# Patient Record
Sex: Female | Born: 1945
Health system: Southern US, Community
[De-identification: ages and names within clinical notes are randomized; demographics above are authoritative.]

## PROBLEM LIST (undated history)

## (undated) DIAGNOSIS — R079 Chest pain, unspecified: Secondary | ICD-10-CM

## (undated) DIAGNOSIS — R0602 Shortness of breath: Secondary | ICD-10-CM

## (undated) DIAGNOSIS — F419 Anxiety disorder, unspecified: Secondary | ICD-10-CM

## (undated) DIAGNOSIS — E039 Hypothyroidism, unspecified: Secondary | ICD-10-CM

## (undated) DIAGNOSIS — E0500 Thyrotoxicosis with diffuse goiter without thyrotoxic crisis or storm: Secondary | ICD-10-CM

## (undated) DIAGNOSIS — Z952 Presence of prosthetic heart valve: Secondary | ICD-10-CM

## (undated) DIAGNOSIS — Z8719 Personal history of other diseases of the digestive system: Secondary | ICD-10-CM

## (undated) DIAGNOSIS — M8580 Other specified disorders of bone density and structure, unspecified site: Secondary | ICD-10-CM

## (undated) DIAGNOSIS — G8929 Other chronic pain: Secondary | ICD-10-CM

## (undated) DIAGNOSIS — I6529 Occlusion and stenosis of unspecified carotid artery: Secondary | ICD-10-CM

## (undated) DIAGNOSIS — I350 Nonrheumatic aortic (valve) stenosis: Secondary | ICD-10-CM

## (undated) DIAGNOSIS — E785 Hyperlipidemia, unspecified: Secondary | ICD-10-CM

## (undated) DIAGNOSIS — I10 Essential (primary) hypertension: Secondary | ICD-10-CM

## (undated) DIAGNOSIS — K219 Gastro-esophageal reflux disease without esophagitis: Secondary | ICD-10-CM

## (undated) DIAGNOSIS — K5792 Diverticulitis of intestine, part unspecified, without perforation or abscess without bleeding: Secondary | ICD-10-CM

## (undated) DIAGNOSIS — Z5189 Encounter for other specified aftercare: Secondary | ICD-10-CM

## (undated) DIAGNOSIS — K635 Polyp of colon: Secondary | ICD-10-CM

## (undated) DIAGNOSIS — R918 Other nonspecific abnormal finding of lung field: Secondary | ICD-10-CM

## (undated) DIAGNOSIS — K2289 Other specified disease of esophagus: Secondary | ICD-10-CM

## (undated) DIAGNOSIS — K228 Other specified diseases of esophagus: Secondary | ICD-10-CM

## (undated) DIAGNOSIS — R011 Cardiac murmur, unspecified: Secondary | ICD-10-CM

## (undated) DIAGNOSIS — K5790 Diverticulosis of intestine, part unspecified, without perforation or abscess without bleeding: Secondary | ICD-10-CM

## (undated) DIAGNOSIS — E119 Type 2 diabetes mellitus without complications: Secondary | ICD-10-CM

## (undated) DIAGNOSIS — M549 Dorsalgia, unspecified: Secondary | ICD-10-CM

## (undated) DIAGNOSIS — M545 Low back pain, unspecified: Secondary | ICD-10-CM

## (undated) DIAGNOSIS — K7460 Unspecified cirrhosis of liver: Secondary | ICD-10-CM

## (undated) DIAGNOSIS — I509 Heart failure, unspecified: Secondary | ICD-10-CM

## (undated) DIAGNOSIS — M1990 Unspecified osteoarthritis, unspecified site: Secondary | ICD-10-CM

## (undated) DIAGNOSIS — I2510 Atherosclerotic heart disease of native coronary artery without angina pectoris: Secondary | ICD-10-CM

## (undated) DIAGNOSIS — K589 Irritable bowel syndrome without diarrhea: Secondary | ICD-10-CM

## (undated) DIAGNOSIS — E079 Disorder of thyroid, unspecified: Secondary | ICD-10-CM

## (undated) DIAGNOSIS — K760 Fatty (change of) liver, not elsewhere classified: Secondary | ICD-10-CM

## (undated) DIAGNOSIS — I639 Cerebral infarction, unspecified: Secondary | ICD-10-CM

## (undated) DIAGNOSIS — M109 Gout, unspecified: Secondary | ICD-10-CM

## (undated) DIAGNOSIS — Z86010 Personal history of colonic polyps: Secondary | ICD-10-CM

## (undated) HISTORY — PX: POLYPECTOMY: SHX149

## (undated) HISTORY — DX: Chest pain, unspecified: R07.9

## (undated) HISTORY — PX: NISSEN FUNDOPLICATION: SHX2091

## (undated) HISTORY — DX: Dorsalgia, unspecified: M54.9

## (undated) HISTORY — DX: Gastro-esophageal reflux disease without esophagitis: K21.9

## (undated) HISTORY — DX: Gout, unspecified: M10.9

## (undated) HISTORY — DX: Shortness of breath: R06.02

## (undated) HISTORY — PX: DILATION AND CURETTAGE OF UTERUS: SHX78

## (undated) HISTORY — DX: Irritable bowel syndrome, unspecified: K58.9

## (undated) HISTORY — DX: Personal history of colonic polyps: Z86.010

## (undated) HISTORY — PX: TONSILLECTOMY: SUR1361

## (undated) HISTORY — DX: Thyrotoxicosis with diffuse goiter without thyrotoxic crisis or storm: E05.00

## (undated) HISTORY — PX: COLONOSCOPY W/ BIOPSIES AND POLYPECTOMY: SHX1376

## (undated) HISTORY — DX: Occlusion and stenosis of unspecified carotid artery: I65.29

## (undated) HISTORY — DX: Diverticulosis of intestine, part unspecified, without perforation or abscess without bleeding: K57.90

## (undated) HISTORY — PX: HYSTEROSCOPY: SHX211

## (undated) HISTORY — DX: Polyp of colon: K63.5

## (undated) HISTORY — DX: Cerebral infarction, unspecified: I63.9

## (undated) HISTORY — PX: GASTRIC FUNDOPLICATION: SHX226

## (undated) HISTORY — DX: Heart failure, unspecified: I50.9

## (undated) HISTORY — PX: LAPAROSCOPIC CHOLECYSTECTOMY: SUR755

## (undated) HISTORY — DX: Hyperlipidemia, unspecified: E78.5

## (undated) HISTORY — DX: Other specified disorders of bone density and structure, unspecified site: M85.80

## (undated) HISTORY — DX: Fatty (change of) liver, not elsewhere classified: K76.0

## (undated) HISTORY — DX: Unspecified osteoarthritis, unspecified site: M19.90

## (undated) HISTORY — PX: HERNIA REPAIR: SHX51

## (undated) HISTORY — DX: Disorder of thyroid, unspecified: E07.9

## (undated) HISTORY — PX: COLONOSCOPY: SHX174

## (undated) HISTORY — DX: Diverticulitis of intestine, part unspecified, without perforation or abscess without bleeding: K57.92

## (undated) HISTORY — DX: Encounter for other specified aftercare: Z51.89

## (undated) HISTORY — DX: Essential (primary) hypertension: I10

## (undated) HISTORY — DX: Cardiac murmur, unspecified: R01.1

## (undated) HISTORY — DX: Anxiety disorder, unspecified: F41.9

## (undated) HISTORY — DX: Unspecified cirrhosis of liver: K74.60

## (undated) HISTORY — PX: LAPAROSCOPY: SHX197

---

## 1983-04-24 HISTORY — PX: LAPAROSCOPIC CHOLECYSTECTOMY: SUR755

## 1992-03-29 ENCOUNTER — Encounter: Payer: Self-pay | Admitting: Internal Medicine

## 1998-01-20 ENCOUNTER — Other Ambulatory Visit: Admission: RE | Admit: 1998-01-20 | Discharge: 1998-01-20 | Payer: Self-pay | Admitting: Obstetrics and Gynecology

## 1998-01-27 ENCOUNTER — Ambulatory Visit (HOSPITAL_COMMUNITY): Admission: RE | Admit: 1998-01-27 | Discharge: 1998-01-27 | Payer: Self-pay | Admitting: Obstetrics and Gynecology

## 1998-05-04 ENCOUNTER — Ambulatory Visit (HOSPITAL_COMMUNITY): Admission: RE | Admit: 1998-05-04 | Discharge: 1998-05-04 | Payer: Self-pay | Admitting: Obstetrics and Gynecology

## 1998-05-04 ENCOUNTER — Encounter: Payer: Self-pay | Admitting: Obstetrics and Gynecology

## 1999-01-23 ENCOUNTER — Encounter: Payer: Self-pay | Admitting: Obstetrics and Gynecology

## 1999-01-23 ENCOUNTER — Ambulatory Visit (HOSPITAL_COMMUNITY): Admission: RE | Admit: 1999-01-23 | Discharge: 1999-01-23 | Payer: Self-pay | Admitting: Obstetrics and Gynecology

## 1999-01-31 ENCOUNTER — Other Ambulatory Visit: Admission: RE | Admit: 1999-01-31 | Discharge: 1999-01-31 | Payer: Self-pay | Admitting: Obstetrics and Gynecology

## 1999-04-12 ENCOUNTER — Encounter: Payer: Self-pay | Admitting: General Surgery

## 1999-04-12 ENCOUNTER — Encounter: Admission: RE | Admit: 1999-04-12 | Discharge: 1999-04-12 | Payer: Self-pay | Admitting: General Surgery

## 1999-09-04 ENCOUNTER — Ambulatory Visit (HOSPITAL_COMMUNITY): Admission: RE | Admit: 1999-09-04 | Discharge: 1999-09-04 | Payer: Self-pay | Admitting: *Deleted

## 2000-01-25 ENCOUNTER — Encounter: Payer: Self-pay | Admitting: Obstetrics and Gynecology

## 2000-01-25 ENCOUNTER — Ambulatory Visit (HOSPITAL_COMMUNITY): Admission: RE | Admit: 2000-01-25 | Discharge: 2000-01-25 | Payer: Self-pay | Admitting: Obstetrics and Gynecology

## 2000-02-29 ENCOUNTER — Other Ambulatory Visit: Admission: RE | Admit: 2000-02-29 | Discharge: 2000-02-29 | Payer: Self-pay | Admitting: Obstetrics and Gynecology

## 2001-02-25 ENCOUNTER — Ambulatory Visit (HOSPITAL_COMMUNITY): Admission: RE | Admit: 2001-02-25 | Discharge: 2001-02-25 | Payer: Self-pay | Admitting: Obstetrics and Gynecology

## 2001-02-25 ENCOUNTER — Encounter: Payer: Self-pay | Admitting: Obstetrics and Gynecology

## 2001-03-05 ENCOUNTER — Other Ambulatory Visit: Admission: RE | Admit: 2001-03-05 | Discharge: 2001-03-05 | Payer: Self-pay | Admitting: Obstetrics and Gynecology

## 2001-03-18 ENCOUNTER — Encounter: Payer: Self-pay | Admitting: Gastroenterology

## 2001-03-24 ENCOUNTER — Encounter: Payer: Self-pay | Admitting: Emergency Medicine

## 2001-03-24 ENCOUNTER — Emergency Department (HOSPITAL_COMMUNITY): Admission: EM | Admit: 2001-03-24 | Discharge: 2001-03-24 | Payer: Self-pay | Admitting: Emergency Medicine

## 2001-03-24 ENCOUNTER — Encounter: Payer: Self-pay | Admitting: Gastroenterology

## 2001-03-25 ENCOUNTER — Encounter: Admission: RE | Admit: 2001-03-25 | Discharge: 2001-03-25 | Payer: Self-pay | Admitting: Gastroenterology

## 2001-03-25 ENCOUNTER — Encounter: Payer: Self-pay | Admitting: Gastroenterology

## 2001-04-08 ENCOUNTER — Encounter: Payer: Self-pay | Admitting: Gastroenterology

## 2001-11-21 ENCOUNTER — Encounter: Payer: Self-pay | Admitting: Family Medicine

## 2001-11-21 ENCOUNTER — Encounter: Admission: RE | Admit: 2001-11-21 | Discharge: 2001-11-21 | Payer: Self-pay | Admitting: Family Medicine

## 2002-02-26 ENCOUNTER — Encounter: Payer: Self-pay | Admitting: Obstetrics and Gynecology

## 2002-02-26 ENCOUNTER — Ambulatory Visit (HOSPITAL_COMMUNITY): Admission: RE | Admit: 2002-02-26 | Discharge: 2002-02-26 | Payer: Self-pay | Admitting: Obstetrics and Gynecology

## 2002-08-11 ENCOUNTER — Encounter: Admission: RE | Admit: 2002-08-11 | Discharge: 2002-11-09 | Payer: Self-pay

## 2003-09-01 ENCOUNTER — Encounter: Payer: Self-pay | Admitting: Family Medicine

## 2003-09-01 LAB — HM COLONOSCOPY: HM Colonoscopy: NORMAL

## 2004-07-18 ENCOUNTER — Emergency Department (HOSPITAL_COMMUNITY): Admission: EM | Admit: 2004-07-18 | Discharge: 2004-07-18 | Payer: Self-pay | Admitting: Emergency Medicine

## 2004-12-07 ENCOUNTER — Encounter: Payer: Self-pay | Admitting: Family Medicine

## 2005-11-21 ENCOUNTER — Encounter: Payer: Self-pay | Admitting: Family Medicine

## 2007-10-13 ENCOUNTER — Encounter: Payer: Self-pay | Admitting: Family Medicine

## 2008-03-08 ENCOUNTER — Encounter: Payer: Self-pay | Admitting: Family Medicine

## 2008-04-23 LAB — CONVERTED CEMR LAB

## 2008-09-08 ENCOUNTER — Encounter: Payer: Self-pay | Admitting: Family Medicine

## 2008-09-08 ENCOUNTER — Encounter (INDEPENDENT_AMBULATORY_CARE_PROVIDER_SITE_OTHER): Payer: Self-pay | Admitting: *Deleted

## 2008-09-08 LAB — CONVERTED CEMR LAB
ALT: 60 units/L
AST: 56 units/L
Albumin: 4.5 g/dL
Alkaline Phosphatase: 53 units/L
B-12, serum: 1038 pg/mL
BUN: 14 mg/dL
CO2, serum: 23 mmol/L
Calcium: 9.9 mg/dL
Chloride, Serum: 105 mmol/L
Cholesterol: 153 mg/dL
Creatinine, Ser: 0.7 mg/dL
Ferritin: 122 ng/mL
Folate: 24 ng/mL
Globulin: 2.6 g/dL
Glucose, Bld: 135 mg/dL
HCT: 37.2 %
HDL: 39 mg/dL
Hemoglobin: 11.5 g/dL
Hgb A1c MFr Bld: 6.3 %
LDL Cholesterol: 96 mg/dL
MCH: 20.4 pg
MCV: 65.9 fL
Platelets: 314 10*3/uL
Potassium, serum: 4.3 mmol/L
RBC count: 5.64 10*6/uL
Sodium, serum: 140 mmol/L
TSH: 1.83 microintl units/mL
Total Bilirubin: 0.7 mg/dL
Total Protein: 7.1 g/dL
Triglycerides: 89 mg/dL
Vit D, 25-Hydroxy: 24 ng/mL
WBC, blood: 6.2 10*3/uL

## 2008-11-23 ENCOUNTER — Encounter (INDEPENDENT_AMBULATORY_CARE_PROVIDER_SITE_OTHER): Payer: Self-pay | Admitting: *Deleted

## 2008-12-13 ENCOUNTER — Ambulatory Visit: Payer: Self-pay | Admitting: Family Medicine

## 2008-12-13 DIAGNOSIS — R002 Palpitations: Secondary | ICD-10-CM | POA: Insufficient documentation

## 2008-12-13 DIAGNOSIS — R220 Localized swelling, mass and lump, head: Secondary | ICD-10-CM | POA: Insufficient documentation

## 2008-12-13 DIAGNOSIS — M899 Disorder of bone, unspecified: Secondary | ICD-10-CM | POA: Insufficient documentation

## 2008-12-13 DIAGNOSIS — M949 Disorder of cartilage, unspecified: Secondary | ICD-10-CM

## 2008-12-13 DIAGNOSIS — N3289 Other specified disorders of bladder: Secondary | ICD-10-CM | POA: Insufficient documentation

## 2008-12-13 DIAGNOSIS — E785 Hyperlipidemia, unspecified: Secondary | ICD-10-CM | POA: Insufficient documentation

## 2008-12-13 DIAGNOSIS — R221 Localized swelling, mass and lump, neck: Secondary | ICD-10-CM

## 2008-12-13 DIAGNOSIS — F411 Generalized anxiety disorder: Secondary | ICD-10-CM | POA: Insufficient documentation

## 2008-12-13 DIAGNOSIS — E039 Hypothyroidism, unspecified: Secondary | ICD-10-CM | POA: Insufficient documentation

## 2008-12-13 LAB — CONVERTED CEMR LAB
Bilirubin Urine: NEGATIVE
Blood in Urine, dipstick: NEGATIVE
Glucose, Urine, Semiquant: NEGATIVE
Ketones, urine, test strip: NEGATIVE
Nitrite: NEGATIVE
Protein, U semiquant: NEGATIVE
Specific Gravity, Urine: <1.005
Urobilinogen, UA: 0.2
WBC Urine, dipstick: NEGATIVE
pH: 7

## 2008-12-16 ENCOUNTER — Encounter (INDEPENDENT_AMBULATORY_CARE_PROVIDER_SITE_OTHER): Payer: Self-pay | Admitting: *Deleted

## 2008-12-16 LAB — CONVERTED CEMR LAB: Hgb A1c MFr Bld: 6.3 % (ref 4.6–6.5)

## 2008-12-17 ENCOUNTER — Encounter: Admission: RE | Admit: 2008-12-17 | Discharge: 2008-12-17 | Payer: Self-pay | Admitting: Family Medicine

## 2008-12-20 ENCOUNTER — Telehealth (INDEPENDENT_AMBULATORY_CARE_PROVIDER_SITE_OTHER): Payer: Self-pay | Admitting: *Deleted

## 2008-12-24 ENCOUNTER — Ambulatory Visit (HOSPITAL_COMMUNITY): Admission: RE | Admit: 2008-12-24 | Discharge: 2008-12-24 | Payer: Self-pay | Admitting: Family Medicine

## 2008-12-24 ENCOUNTER — Encounter: Payer: Self-pay | Admitting: Family Medicine

## 2009-01-03 ENCOUNTER — Telehealth (INDEPENDENT_AMBULATORY_CARE_PROVIDER_SITE_OTHER): Payer: Self-pay | Admitting: *Deleted

## 2009-01-04 ENCOUNTER — Encounter (INDEPENDENT_AMBULATORY_CARE_PROVIDER_SITE_OTHER): Payer: Self-pay | Admitting: *Deleted

## 2009-01-05 ENCOUNTER — Encounter: Payer: Self-pay | Admitting: Family Medicine

## 2009-03-07 ENCOUNTER — Ambulatory Visit: Payer: Self-pay | Admitting: Family

## 2009-03-07 DIAGNOSIS — J209 Acute bronchitis, unspecified: Secondary | ICD-10-CM | POA: Insufficient documentation

## 2009-03-07 DIAGNOSIS — J018 Other acute sinusitis: Secondary | ICD-10-CM | POA: Insufficient documentation

## 2009-03-10 ENCOUNTER — Ambulatory Visit: Payer: Self-pay | Admitting: Internal Medicine

## 2009-03-10 DIAGNOSIS — E559 Vitamin D deficiency, unspecified: Secondary | ICD-10-CM | POA: Insufficient documentation

## 2009-03-10 LAB — CONVERTED CEMR LAB
ALT: 35 units/L (ref 0–35)
AST: 35 units/L (ref 0–37)
BUN: 16 mg/dL (ref 6–23)
Basophils Absolute: 0 10*3/uL (ref 0.0–0.1)
Basophils Relative: 0.2 % (ref 0.0–3.0)
Cholesterol: 151 mg/dL (ref 0–200)
Creatinine, Ser: 0.7 mg/dL (ref 0.4–1.2)
Creatinine,U: 69.1 mg/dL
Eosinophils Absolute: 0.2 10*3/uL (ref 0.0–0.7)
Eosinophils Relative: 3.7 % (ref 0.0–5.0)
HCT: 38 % (ref 36.0–46.0)
HDL: 37.1 mg/dL — ABNORMAL LOW (ref >39.00)
Hemoglobin: 12.1 g/dL (ref 12.0–15.0)
Hgb A1c MFr Bld: 6.2 % (ref 4.6–6.5)
LDL Cholesterol: 94 mg/dL (ref 0–99)
Lymphocytes Relative: 17.7 % (ref 12.0–46.0)
Lymphs Abs: 1.1 10*3/uL (ref 0.7–4.0)
MCHC: 31.7 g/dL (ref 30.0–36.0)
MCV: 66.5 fL — ABNORMAL LOW (ref 78.0–100.0)
Microalb Creat Ratio: 2.9 mg/g (ref 0.0–30.0)
Microalb, Ur: 0.2 mg/dL (ref 0.0–1.9)
Monocytes Absolute: 0.4 10*3/uL (ref 0.1–1.0)
Monocytes Relative: 6.5 % (ref 3.0–12.0)
Neutro Abs: 4.6 10*3/uL (ref 1.4–7.7)
Neutrophils Relative %: 71.9 % (ref 43.0–77.0)
Platelets: 307 10*3/uL (ref 150.0–400.0)
Potassium: 4.6 meq/L (ref 3.5–5.1)
RBC: 5.72 M/uL — ABNORMAL HIGH (ref 3.87–5.11)
RDW: 14.2 % (ref 11.5–14.6)
TSH: 2.68 microintl units/mL (ref 0.35–5.50)
Total CHOL/HDL Ratio: 4
Triglycerides: 99 mg/dL (ref 0.0–149.0)
VLDL: 19.8 mg/dL (ref 0.0–40.0)
WBC: 6.3 10*3/uL (ref 4.5–10.5)

## 2009-03-11 ENCOUNTER — Encounter (INDEPENDENT_AMBULATORY_CARE_PROVIDER_SITE_OTHER): Payer: Self-pay | Admitting: *Deleted

## 2009-03-11 ENCOUNTER — Telehealth (INDEPENDENT_AMBULATORY_CARE_PROVIDER_SITE_OTHER): Payer: Self-pay | Admitting: *Deleted

## 2009-03-12 LAB — CONVERTED CEMR LAB: Vit D, 25-Hydroxy: 17 ng/mL — ABNORMAL LOW (ref 30–89)

## 2009-03-15 ENCOUNTER — Encounter (INDEPENDENT_AMBULATORY_CARE_PROVIDER_SITE_OTHER): Payer: Self-pay | Admitting: *Deleted

## 2009-03-21 ENCOUNTER — Ambulatory Visit: Payer: Self-pay | Admitting: Internal Medicine

## 2009-03-21 ENCOUNTER — Telehealth: Payer: Self-pay | Admitting: Internal Medicine

## 2009-03-22 ENCOUNTER — Ambulatory Visit: Payer: Self-pay | Admitting: Internal Medicine

## 2009-03-22 LAB — CONVERTED CEMR LAB: Calcium: 9.8 mg/dL (ref 8.4–10.5)

## 2009-03-25 ENCOUNTER — Encounter (INDEPENDENT_AMBULATORY_CARE_PROVIDER_SITE_OTHER): Payer: Self-pay | Admitting: *Deleted

## 2009-04-11 ENCOUNTER — Telehealth (INDEPENDENT_AMBULATORY_CARE_PROVIDER_SITE_OTHER): Payer: Self-pay | Admitting: *Deleted

## 2009-04-11 DIAGNOSIS — I517 Cardiomegaly: Secondary | ICD-10-CM | POA: Insufficient documentation

## 2009-04-13 ENCOUNTER — Encounter (INDEPENDENT_AMBULATORY_CARE_PROVIDER_SITE_OTHER): Payer: Self-pay | Admitting: *Deleted

## 2009-04-28 ENCOUNTER — Ambulatory Visit: Payer: Self-pay

## 2009-04-28 ENCOUNTER — Ambulatory Visit: Payer: Self-pay | Admitting: Internal Medicine

## 2009-04-28 ENCOUNTER — Encounter: Payer: Self-pay | Admitting: Internal Medicine

## 2009-04-28 ENCOUNTER — Ambulatory Visit (HOSPITAL_COMMUNITY): Admission: RE | Admit: 2009-04-28 | Discharge: 2009-04-28 | Payer: Self-pay | Admitting: Internal Medicine

## 2009-04-29 ENCOUNTER — Ambulatory Visit: Payer: Self-pay | Admitting: Internal Medicine

## 2009-04-29 DIAGNOSIS — K589 Irritable bowel syndrome without diarrhea: Secondary | ICD-10-CM | POA: Insufficient documentation

## 2009-04-29 DIAGNOSIS — J019 Acute sinusitis, unspecified: Secondary | ICD-10-CM | POA: Insufficient documentation

## 2009-05-02 ENCOUNTER — Telehealth (INDEPENDENT_AMBULATORY_CARE_PROVIDER_SITE_OTHER): Payer: Self-pay | Admitting: *Deleted

## 2009-05-06 ENCOUNTER — Ambulatory Visit: Payer: Self-pay | Admitting: Internal Medicine

## 2009-05-06 ENCOUNTER — Encounter (INDEPENDENT_AMBULATORY_CARE_PROVIDER_SITE_OTHER): Payer: Self-pay | Admitting: *Deleted

## 2009-05-06 LAB — CONVERTED CEMR LAB: Rapid Strep: NEGATIVE

## 2009-05-07 LAB — CONVERTED CEMR LAB
BUN: 14 mg/dL (ref 6–23)
Creatinine, Ser: 0.61 mg/dL (ref 0.40–1.20)
Potassium: 4.3 meq/L (ref 3.5–5.3)
TSH: 1.83 microintl units/mL (ref 0.35–5.50)

## 2009-05-09 ENCOUNTER — Encounter (INDEPENDENT_AMBULATORY_CARE_PROVIDER_SITE_OTHER): Payer: Self-pay | Admitting: *Deleted

## 2009-05-10 ENCOUNTER — Encounter: Payer: Self-pay | Admitting: Internal Medicine

## 2009-05-10 ENCOUNTER — Encounter (INDEPENDENT_AMBULATORY_CARE_PROVIDER_SITE_OTHER): Payer: Self-pay | Admitting: *Deleted

## 2009-05-10 LAB — CONVERTED CEMR LAB
Basophils Absolute: 0.1 10*3/uL (ref 0.0–0.1)
Basophils Relative: 1.1 % (ref 0.0–3.0)
Eosinophils Absolute: 0.2 10*3/uL (ref 0.0–0.7)
Eosinophils Relative: 2.5 % (ref 0.0–5.0)
HCT: 38.6 % (ref 36.0–46.0)
Hemoglobin: 12.1 g/dL (ref 12.0–15.0)
Lymphocytes Relative: 23.1 % (ref 12.0–46.0)
Lymphs Abs: 1.5 10*3/uL (ref 0.7–4.0)
MCHC: 31.2 g/dL (ref 30.0–36.0)
MCV: 65.7 fL — ABNORMAL LOW (ref 78.0–100.0)
Monocytes Absolute: 0.4 10*3/uL (ref 0.1–1.0)
Monocytes Relative: 6 % (ref 3.0–12.0)
Neutro Abs: 4.3 10*3/uL (ref 1.4–7.7)
Neutrophils Relative %: 67.3 % (ref 43.0–77.0)
Platelets: 324 10*3/uL (ref 150.0–400.0)
RBC: 5.87 M/uL — ABNORMAL HIGH (ref 3.87–5.11)
RDW: 13.9 % (ref 11.5–14.6)
WBC: 6.5 10*3/uL (ref 4.5–10.5)

## 2009-05-11 ENCOUNTER — Ambulatory Visit: Payer: Self-pay | Admitting: Cardiovascular Disease

## 2009-05-19 ENCOUNTER — Telehealth (INDEPENDENT_AMBULATORY_CARE_PROVIDER_SITE_OTHER): Payer: Self-pay | Admitting: *Deleted

## 2009-05-20 ENCOUNTER — Encounter (INDEPENDENT_AMBULATORY_CARE_PROVIDER_SITE_OTHER): Payer: Self-pay | Admitting: *Deleted

## 2009-06-01 ENCOUNTER — Ambulatory Visit: Payer: Self-pay | Admitting: Gastroenterology

## 2009-07-18 ENCOUNTER — Ambulatory Visit: Payer: Self-pay | Admitting: Internal Medicine

## 2009-07-19 LAB — CONVERTED CEMR LAB: Hgb A1c MFr Bld: 7.6 % — ABNORMAL HIGH (ref 4.6–6.5)

## 2009-07-20 LAB — CONVERTED CEMR LAB: Vit D, 25-Hydroxy: 30 ng/mL (ref 30–89)

## 2009-07-21 ENCOUNTER — Telehealth: Payer: Self-pay | Admitting: Internal Medicine

## 2009-07-27 ENCOUNTER — Telehealth (INDEPENDENT_AMBULATORY_CARE_PROVIDER_SITE_OTHER): Payer: Self-pay | Admitting: *Deleted

## 2009-08-05 ENCOUNTER — Ambulatory Visit: Payer: Self-pay | Admitting: Endocrinology

## 2009-08-10 ENCOUNTER — Encounter: Payer: Self-pay | Admitting: Family Medicine

## 2009-09-12 ENCOUNTER — Ambulatory Visit: Payer: Self-pay | Admitting: Internal Medicine

## 2009-09-12 ENCOUNTER — Ambulatory Visit: Payer: Self-pay | Admitting: Family Medicine

## 2009-09-12 DIAGNOSIS — M79609 Pain in unspecified limb: Secondary | ICD-10-CM | POA: Insufficient documentation

## 2009-09-12 DIAGNOSIS — M25569 Pain in unspecified knee: Secondary | ICD-10-CM | POA: Insufficient documentation

## 2009-09-13 LAB — CONVERTED CEMR LAB
Cholesterol: 184 mg/dL (ref 0–200)
HDL: 33.2 mg/dL — ABNORMAL LOW (ref >39.00)
Hgb A1c MFr Bld: 7.8 % — ABNORMAL HIGH (ref 4.6–6.5)
LDL Cholesterol: 124 mg/dL — ABNORMAL HIGH (ref 0–99)
Total CHOL/HDL Ratio: 6
Triglycerides: 136 mg/dL (ref 0.0–149.0)
VLDL: 27.2 mg/dL (ref 0.0–40.0)

## 2009-09-14 ENCOUNTER — Telehealth (INDEPENDENT_AMBULATORY_CARE_PROVIDER_SITE_OTHER): Payer: Self-pay | Admitting: *Deleted

## 2009-09-21 ENCOUNTER — Encounter: Payer: Self-pay | Admitting: Family Medicine

## 2009-10-18 ENCOUNTER — Telehealth (INDEPENDENT_AMBULATORY_CARE_PROVIDER_SITE_OTHER): Payer: Self-pay | Admitting: *Deleted

## 2009-10-19 ENCOUNTER — Telehealth (INDEPENDENT_AMBULATORY_CARE_PROVIDER_SITE_OTHER): Payer: Self-pay | Admitting: *Deleted

## 2009-10-26 ENCOUNTER — Telehealth: Payer: Self-pay | Admitting: Endocrinology

## 2009-11-03 ENCOUNTER — Ambulatory Visit: Payer: Self-pay | Admitting: Endocrinology

## 2009-11-16 ENCOUNTER — Encounter: Payer: Self-pay | Admitting: Family Medicine

## 2009-11-21 ENCOUNTER — Telehealth (INDEPENDENT_AMBULATORY_CARE_PROVIDER_SITE_OTHER): Payer: Self-pay | Admitting: *Deleted

## 2009-12-05 ENCOUNTER — Telehealth: Payer: Self-pay | Admitting: Family Medicine

## 2009-12-21 ENCOUNTER — Ambulatory Visit: Payer: Self-pay | Admitting: Family Medicine

## 2009-12-21 DIAGNOSIS — E669 Obesity, unspecified: Secondary | ICD-10-CM | POA: Insufficient documentation

## 2009-12-29 ENCOUNTER — Telehealth (INDEPENDENT_AMBULATORY_CARE_PROVIDER_SITE_OTHER): Payer: Self-pay | Admitting: *Deleted

## 2009-12-29 ENCOUNTER — Ambulatory Visit: Payer: Self-pay | Admitting: Endocrinology

## 2009-12-29 DIAGNOSIS — K7689 Other specified diseases of liver: Secondary | ICD-10-CM | POA: Insufficient documentation

## 2009-12-29 LAB — CONVERTED CEMR LAB
ALT: 49 units/L — ABNORMAL HIGH (ref 0–35)
ALT: 43 units/L — ABNORMAL HIGH (ref 0–35)
AST: 55 units/L — ABNORMAL HIGH (ref 0–37)
AST: 41 units/L — ABNORMAL HIGH (ref 0–37)
Albumin: 4.1 g/dL (ref 3.5–5.2)
Albumin: 4.4 g/dL (ref 3.5–5.2)
Alkaline Phosphatase: 41 units/L (ref 39–117)
Alkaline Phosphatase: 42 units/L (ref 39–117)
BUN: 18 mg/dL (ref 6–23)
Bilirubin, Direct: 0.2 mg/dL (ref 0.0–0.3)
Bilirubin, Direct: 0.2 mg/dL (ref 0.0–0.3)
CO2: 24 meq/L (ref 19–32)
Calcium: 9.6 mg/dL (ref 8.4–10.5)
Chloride: 104 meq/L (ref 96–112)
Creatinine, Ser: 0.7 mg/dL (ref 0.4–1.2)
GFR calc non Af Amer: 86.11 mL/min (ref >60)
Glucose, Bld: 211 mg/dL — ABNORMAL HIGH (ref 70–99)
Hgb A1c MFr Bld: 6.7 % — ABNORMAL HIGH (ref 4.6–6.5)
Potassium: 4.4 meq/L (ref 3.5–5.1)
Sodium: 138 meq/L (ref 135–145)
Total Bilirubin: 0.7 mg/dL (ref 0.3–1.2)
Total Bilirubin: 0.8 mg/dL (ref 0.3–1.2)
Total Protein: 6.9 g/dL (ref 6.0–8.3)
Total Protein: 7.3 g/dL (ref 6.0–8.3)

## 2009-12-30 ENCOUNTER — Telehealth (INDEPENDENT_AMBULATORY_CARE_PROVIDER_SITE_OTHER): Payer: Self-pay | Admitting: *Deleted

## 2010-01-02 ENCOUNTER — Ambulatory Visit (HOSPITAL_COMMUNITY): Admission: RE | Admit: 2010-01-02 | Discharge: 2010-01-02 | Payer: Self-pay | Admitting: Family Medicine

## 2010-01-02 LAB — HM MAMMOGRAPHY: HM Mammogram: NORMAL

## 2010-01-30 ENCOUNTER — Telehealth (INDEPENDENT_AMBULATORY_CARE_PROVIDER_SITE_OTHER): Payer: Self-pay | Admitting: *Deleted

## 2010-01-31 ENCOUNTER — Ambulatory Visit: Payer: Self-pay | Admitting: Family Medicine

## 2010-02-07 ENCOUNTER — Encounter: Payer: Self-pay | Admitting: Family Medicine

## 2010-03-24 ENCOUNTER — Ambulatory Visit: Payer: Self-pay | Admitting: Family Medicine

## 2010-03-24 ENCOUNTER — Encounter: Payer: Self-pay | Admitting: Family Medicine

## 2010-03-24 DIAGNOSIS — R1084 Generalized abdominal pain: Secondary | ICD-10-CM | POA: Insufficient documentation

## 2010-03-24 DIAGNOSIS — R109 Unspecified abdominal pain: Secondary | ICD-10-CM

## 2010-03-26 LAB — CONVERTED CEMR LAB
ALT: 49 units/L — ABNORMAL HIGH (ref 0–35)
AST: 41 units/L — ABNORMAL HIGH (ref 0–37)
Albumin: 4.3 g/dL (ref 3.5–5.2)
Alkaline Phosphatase: 55 units/L (ref 39–117)
BUN: 22 mg/dL (ref 6–23)
Basophils Absolute: 0 10*3/uL (ref 0.0–0.1)
Basophils Relative: 0.5 % (ref 0.0–3.0)
Bilirubin, Direct: 0.2 mg/dL (ref 0.0–0.3)
CO2: 27 meq/L (ref 19–32)
Calcium: 9.7 mg/dL (ref 8.4–10.5)
Chloride: 104 meq/L (ref 96–112)
Cholesterol: 169 mg/dL (ref 0–200)
Creatinine, Ser: 0.6 mg/dL (ref 0.4–1.2)
Eosinophils Absolute: 0.2 10*3/uL (ref 0.0–0.7)
Eosinophils Relative: 2.9 % (ref 0.0–5.0)
GFR calc non Af Amer: 102.25 mL/min (ref >60)
Glucose, Bld: 154 mg/dL — ABNORMAL HIGH (ref 70–99)
HCT: 36 % (ref 36.0–46.0)
HDL: 37.7 mg/dL — ABNORMAL LOW (ref >39.00)
Hemoglobin: 11.9 g/dL — ABNORMAL LOW (ref 12.0–15.0)
Iron: 90 ug/dL (ref 42–145)
LDL Cholesterol: 110 mg/dL — ABNORMAL HIGH (ref 0–99)
Lymphocytes Relative: 18.3 % (ref 12.0–46.0)
Lymphs Abs: 1.3 10*3/uL (ref 0.7–4.0)
MCHC: 33.1 g/dL (ref 30.0–36.0)
MCV: 63.6 fL — ABNORMAL LOW (ref 78.0–100.0)
Monocytes Absolute: 0.5 10*3/uL (ref 0.1–1.0)
Monocytes Relative: 6.9 % (ref 3.0–12.0)
Neutro Abs: 4.9 10*3/uL (ref 1.4–7.7)
Neutrophils Relative %: 71.4 % (ref 43.0–77.0)
Platelets: 254 10*3/uL (ref 150.0–400.0)
Potassium: 4.1 meq/L (ref 3.5–5.1)
RBC: 5.65 M/uL — ABNORMAL HIGH (ref 3.87–5.11)
RDW: 15.1 % — ABNORMAL HIGH (ref 11.5–14.6)
Saturation Ratios: 20.3 % (ref 20.0–50.0)
Sodium: 141 meq/L (ref 135–145)
TSH: 2.35 microintl units/mL (ref 0.35–5.50)
Total Bilirubin: 0.9 mg/dL (ref 0.3–1.2)
Total CHOL/HDL Ratio: 4
Total Protein: 7 g/dL (ref 6.0–8.3)
Transferrin: 316.5 mg/dL (ref 212.0–360.0)
Triglycerides: 107 mg/dL (ref 0.0–149.0)
VLDL: 21.4 mg/dL (ref 0.0–40.0)
WBC: 6.9 10*3/uL (ref 4.5–10.5)

## 2010-03-27 ENCOUNTER — Telehealth (INDEPENDENT_AMBULATORY_CARE_PROVIDER_SITE_OTHER): Payer: Self-pay | Admitting: *Deleted

## 2010-03-27 LAB — CONVERTED CEMR LAB: Vit D, 25-Hydroxy: 24 ng/mL — ABNORMAL LOW (ref 30–89)

## 2010-04-14 ENCOUNTER — Ambulatory Visit: Payer: Self-pay | Admitting: Family Medicine

## 2010-04-14 ENCOUNTER — Telehealth (INDEPENDENT_AMBULATORY_CARE_PROVIDER_SITE_OTHER): Payer: Self-pay | Admitting: *Deleted

## 2010-05-13 ENCOUNTER — Encounter: Payer: Self-pay | Admitting: Sports Medicine

## 2010-05-14 ENCOUNTER — Encounter: Payer: Self-pay | Admitting: Podiatry

## 2010-05-16 ENCOUNTER — Telehealth (INDEPENDENT_AMBULATORY_CARE_PROVIDER_SITE_OTHER): Payer: Self-pay | Admitting: *Deleted

## 2010-05-25 NOTE — Letter (Signed)
Summary: Results Follow up Letter  [facility]   [place], [place] [address]   Phone: [phone]  Fax: [phone]    03/25/2009 MRN: [id number]  Breanna [address] [place], [place]  [address]  Dear [name],  The following are the results of your recent test(s):  Test         Result    Pap Smear:        Normal _____  Not Normal _____ Comments: ______________________________________________________ Cholesterol: LDL(Bad cholesterol):         Your goal is less than:         HDL (Good cholesterol):       Your goal is more than: Comments:  ______________________________________________________ Mammogram:        Normal _____  Not Normal _____ Comments:  ___________________________________________________________________ Hemoccult:        Normal _____  Not normal _______ Comments:    _____________________________________________________________________ Other Tests: Please see attached Chest Xray done on 03/22/2009    We routinely do not discuss normal results over the telephone.  If you desire a copy of the results, or you have any questions about this information we can discuss them at your next office visit.   Sincerely,

## 2010-05-25 NOTE — Letter (Signed)
Summary: [facility] Screening/[facility]   Imported By: Edmonia James 12/30/2009 09:16:03  _____________________________________________________________________  External Attachment:    Type:   Image     Comment:   External Document

## 2010-05-25 NOTE — Assessment & Plan Note (Signed)
Summary: CPX/FASTING//KN   Vital Signs:  Patient profile:   65 year old female Height:      65.25 inches Weight:      188 pounds BMI:     31.16 Pulse rate:   74 / minute BP sitting:   126 / 80  (left arm)  Vitals Entered By: Malachi Bonds CMA (March 24, 2010 8:37 AM) CC: YEARLY EXAM AND LABS    History of Present Illness: 65 yo woman here today for CPE.  UTD on pap, mammogram.  UTD on colonoscopy.  has had flu shot.  Here for Medicare AWV:  1.   Risk factors based on Past M, S, F history: DM- following w/ Dr Loanne Drilling. Hypothyroid- due for labs.  'i have a lot of energy- maybe too much.  we need to check the dose'.  does note brittle nails. Hyperlipidemia- intolerant to statins, on fenofibrate.  no N/V, myalgias. Pelvic pain- saw urology and w/u was negative, saw gyn w/out results, 'it feels like there is a speculum going right through me'.  pain also in back.  worse after busy day.  difficulty getting up from the floor.  L sided.  'it feels like i'm straining'. 2.   Physical Activities: plans on joining a gym, very active in daily activities 3.   Depression/mood: denies current sxs, on Lexapro. 4.   Hearing: normal to whispered voice at 6 ft 5.   ADL's: independent 6.   Fall Risk: not at risk 7.   Home Safety: feels very safe at home, lives w/ husband 64.   Height, weight, &visual acuity: see vitals, vision corrected to 20/20 w/ glasses 9.   Counseling: provided on healthy diet, regular exercise 10.   Labs ordered based on risk factors: see A&P 11.           Referral Coordination: see A&P 12.           Care Plan: see A&P 13.           Cognitive Assessment: normal linear thought process, memory intact, oriented x3  Preventive Screening-Counseling & Management  Alcohol-Tobacco     Alcohol drinks/day: <1     Smoking Status: never  Caffeine-Diet-Exercise     Does Patient Exercise: no      Sexual History:  currently monogamous.        Drug Use:  never.    Current  Medications (verified): 1)  Atenolol 25 Mg Tabs (Atenolol) .Marland Kitchen.. 1 By Mouth Once Daily 2)  Synthroid 150 Mcg Tabs (Levothyroxine Sodium) .... 0.52m Once Daily 3)  Nortriptyline Hcl 25 Mg Caps (Nortriptyline Hcl) ..Marland Kitchen. 1 By Mouth Once Daily 4)  Lexapro 20 Mg Tabs (Escitalopram Oxalate) ..Marland Kitchen. 1 1/2 By Mouth Once Daily 5)  Benicar 20 Mg Tabs (Olmesartan Medoxomil) ..Marland Kitchen. 1 By Mouth Once Daily 6)  Bayer Low Strength 81 Mg Tbec (Aspirin) ..Marland Kitchen. 1 By Mouth Once Daily 7)  Multivitamins  Tabs (Multiple Vitamin) ..Marland Kitchen. 1 By Mouth Once Daily 8)  Folic Acid 8798Mcg Tabs (Folic Acid) ..Marland Kitchen. 1 By Mouth Once Daily 9)  Vitamin C 500 Mg Tabs (Ascorbic Acid) ..Marland Kitchen. 1 By Mouth Once Daily 10)  Vitamin B-12 1000 Mcg Tabs (Cyanocobalamin) ..Marland Kitchen. 1 By Mouth Once Daily 11)  Vitamin D3 5000 Units Tabs (Cholecalciferol) ..Marland Kitchen. 1 Once Daily 12)  Fish Oil  Oil (Fish Oil) .... Take One By Mouth Once Daily 13)  Fenofibrate Micronized 200 Mg Caps (Fenofibrate Micronized) .... Take One Tablet Daily 14)  Janumet  50-1000 Mg Tabs (Sitagliptin-Metformin Hcl) .Marland Kitchen.. 1 Tab By Mouth Two Times A Day 15)  Accu-Chek Comfort Curve  Strp (Glucose Blood) .... Use As Directed  Allergies (verified): No Known Drug Allergies  Past History:  Past medical, surgical, family and social histories (including risk factors) reviewed, and no changes noted (except as noted below).  Past Medical History: DM GERD Graves Disease Osteopenia Hyperlipidemia  Past Surgical History: Reviewed history from 06/01/2009 and no changes required. hysteroscopy/laparoscopy for fibroids Cholecystectomy Hiatal hernia surgery  Family History: Reviewed history from 08/05/2009 and no changes required. ADOPTED, but pt says a sister had dm CANCER, REFLUX,DM, HEART IN BIRTH FAM (? WHO)  Social History: Reviewed history from 06/01/2009 and no changes required. 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  Alcohol Use - no Daily Caffeine Use one per day Illicit Drug Use - no Does Patient Exercise:  no Drug Use:  never  Review of Systems  The patient denies anorexia, fever, weight loss, weight gain, vision loss, decreased hearing, hoarseness, chest pain, syncope, dyspnea on exertion, peripheral edema, prolonged cough, headaches, abdominal pain, melena, hematochezia, severe indigestion/heartburn, hematuria, suspicious skin lesions, depression, abnormal bleeding, enlarged lymph nodes, and breast masses.    Physical Exam  General:  well-nourished,in no acute distress; alert,appropriate and cooperative throughout examination Head:  Normocephalic and atraumatic without obvious abnormalities. No apparent alopecia or balding. Eyes:  No corneal or conjunctival inflammation noted. EOMI. Perrla. Funduscopic exam benign, without hemorrhages, exudates or papilledema. Vision grossly normal. Ears:  External ear exam shows no significant lesions or deformities.  Otoscopic examination reveals clear canals, tympanic membranes are intact bilaterally without bulging, retraction, inflammation or discharge. Hearing is grossly normal bilaterally. Nose:  External nasal examination shows no deformity or inflammation. Nasal mucosa are pink and moist without lesions or exudates. Mouth:  Oral mucosa and oropharynx without lesions or exudates.  Teeth in good repair. Neck:  No deformities, masses, or tenderness noted. Breasts:  gyn Lungs:  Normal respiratory effort, chest expands symmetrically. Lungs are clear to auscultation, no crackles or wheezes Heart:  normal rate, regular rhythm, no gallop, no rub, no JVD, no HJR, and grade  2/6 systolic murmur  Abdomen:  soft, NT/ND, +BS.  no hernia, organomegaly Genitalia:  gyn Pulses:  +2 radial, carotid, DP/PT Extremities:  no C/C/E Neurologic:  No cranial nerve deficits noted. Station and gait are normal. Plantar  reflexes are down-going bilaterally. DTRs are symmetrical throughout. Sensory, motor and coordinative functions appear intact. Skin:  Intact without suspicious lesions or rashes.  Cervical Nodes:  No lymphadenopathy noted Axillary Nodes:  No palpable lymphadenopathy Psych:  memory intact for recent and remote, normally interactive, and good eye contact.     Impression & Recommendations:  Problem # 1:  PHYSICAL EXAMINATION (ICD-V70.0) Assessment New  pt's PE WNL.  check labs.  UTD on health maintainence.  anticipatory guidance provided.  Orders: Medicare -1st Annual Wellness Visit [id number])  Problem # 2:  DIABETES MELLITUS, UNCONTROLLED (ICD-250.02) Assessment: Unchanged following w/ Dr Loanne Drilling now. Her updated medication list for this problem includes:    Benicar 20 Mg Tabs (Olmesartan medoxomil) .Marland Kitchen... 1 by mouth once daily    Bayer Low Strength 81 Mg Tbec (Aspirin) .Marland Kitchen... 1 by mouth once daily    Janumet 50-1000 Mg Tabs (Sitagliptin-metformin hcl) .Marland Kitchen... 1 tab by mouth two times a day  Problem # 3:  HYPOTHYROIDISM (ICD-244.9) Assessment: Unchanged due for labs.  adjust meds as needed. Her updated medication list for this problem includes:    Synthroid 150 Mcg Tabs (Levothyroxine sodium) .Marland Kitchen... 0.29m once daily  Orders: TLB-TSH (Thyroid Stimulating Hormone) (84443-TSH)  Problem # 4:  PELVIC  PAIN (ICD-789.09) Assessment: New pt has seen GYN and urology w/out relief.  pain is intermittant.  based on pt's description may be pubic symphsis pain.  start NSAIDs and see if pain improves.  will follow. Her updated medication list for this problem includes:    Bayer Low Strength 81 Mg Tbec (Aspirin) ..Marland Kitchen.. 1 by mouth once daily  Complete Medication List: 1)  Atenolol 25 Mg Tabs (Atenolol) ..Marland Kitchen. 1 by mouth once daily 2)  Synthroid 150 Mcg Tabs (Levothyroxine sodium) .... 0.180monce daily 3)  Nortriptyline Hcl 25 Mg Caps (Nortriptyline hcl) ...Marland Kitchen 1 by mouth once daily 4)  Lexapro 20 Mg  Tabs (Escitalopram oxalate) ...Marland Kitchen 1 1/2 by mouth once daily 5)  Benicar 20 Mg Tabs (Olmesartan medoxomil) ...Marland Kitchen 1 by mouth once daily 6)  Bayer Low Strength 81 Mg Tbec (Aspirin) ...Marland Kitchen 1 by mouth once daily 7)  Multivitamins Tabs (Multiple vitamin) ...Marland Kitchen 1 by mouth once daily 8)  Folic Acid 80423cg Tabs (Folic acid) ...Marland Kitchen 1 by mouth once daily 9)  Vitamin C 500 Mg Tabs (Ascorbic acid) ...Marland Kitchen 1 by mouth once daily 10)  Vitamin B-12 1000 Mcg Tabs (Cyanocobalamin) ...Marland Kitchen 1 by mouth once daily 11)  Vitamin D3 5000 Units Tabs (cholecalciferol)  ...Marland Kitchen 1 once daily 12)  Fish Oil Oil (Fish oil) .... Take one by mouth once daily 13)  Fenofibrate Micronized 200 Mg Caps (Fenofibrate micronized) .... Take one tablet daily 14)  Janumet 50-1000 Mg Tabs (Sitagliptin-metformin hcl) ...Marland Kitchen 1 tab by mouth two times a day 15)  Accu-chek Comfort Curve Strp (Glucose blood) .... Use as directed 16)  Vitamin D (ergocalciferol) 50000 Unit Caps (Ergocalciferol) .... Take 1 tab weekly for 12 weeks then recheck level  Other Orders: Venipuncture (3(53614T-Vitamin D (25-Hydroxy) ([id number] w/ Interpretation (93000) TLB-Hepatic/Liver Function Pnl (80076-HEPATIC) TLB-Lipid Panel (80061-LIPID) TLB-BMP (Basic Metabolic Panel-BMET) (8061950-DTOIZTITLB-CBC Platelet - w/Differential (85025-CBCD)  Patient Instructions: 1)  Follow up in 6 months to recheck cholesterol- don't eat before this appt 2)  We'll notify you of your lab results 3)  Try and get regular exercise 4)  Take ibuprofen 60043m3 pills) three times a day as needed for pelvic pain.  Take w/ food 5)  Call with any questions or concerns 6)  Happy Holidays!!!   Orders Added: 1)  Venipuncture [36[45809]  T-Vitamin D (25-Hydroxy) [[id number]  EKG w/ Interpretation [93000] 4)  TLB-Hepatic/Liver Function Pnl [80076-HEPATIC] 5)  TLB-Lipid Panel [80061-LIPID] 6)  TLB-TSH (Thyroid Stimulating Hormone) [84443-TSH] 7)  TLB-BMP (Basic Metabolic Panel-BMET)  [80[[id number]]  TLB-CBC Platelet - w/Differential [85025-CBCD] 9)  Medicare -1st Annual Wellness Visit [G0[W4097])  Est. Patient Level III [99[35329]

## 2010-05-25 NOTE — Progress Notes (Signed)
Summary: Needs referral  Phone Note Call from Patient Call back at Home Phone [phone]   Caller: Patient Reason for Call: Referral Summary of Call: Patient reiieved a note from you that she needs to see an Endocrinologist and she needs a referral please.  Initial call taken by: Elna Breslow,  July 21, 2009 1:53 PM  New Problems: DIABETES MELLITUS, UNCONTROLLED (ICD-250.02)   New Problems: DIABETES MELLITUS, UNCONTROLLED (ICD-250.02)
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
## 2010-05-25 NOTE — Progress Notes (Signed)
Summary: Rx req  Phone Note Call from Patient Call back at Work Phone [phone]   Caller: Patient Summary of Call: Pt called stating that she has to travel out of the country urgently and is currently in [place]. Pt requested Rx to [facility] in [place] and will have Rx transferred to local [facility] in [place] Initial call taken by: Crissie Sickles, [place],  October 26, 2009 4:40 PM    New/Updated Medications: JANUMET 50-1000 MG TABS (SITAGLIPTIN-METFORMIN HCL) 1 tab by mouth two times a day Prescriptions: JANUMET 50-1000 MG TABS (SITAGLIPTIN-METFORMIN HCL) 1 tab by mouth two times a day  #60 x 1   Entered by:   Crissie Sickles, CMA   Authorized by:   Donavan Foil MD   Signed by:   Crissie Sickles, CMA on 10/26/2009   Method used:   Electronically to        [facility]. [phone]* (retail)       [address]       [place], [place]  [address]       Ph: [phone]       Fax: [phone]   RxID:   [id number]

## 2010-05-25 NOTE — Progress Notes (Signed)
Summary: ultrasound report  Phone Note Outgoing Call   Call placed by: Malachi Bonds,  December 20, 2008 10:29 AM Call placed to: Patient Summary of Call: pt w/ reactive lymph node- likely enlarged due to recent illness.  did not comment on any concerning features.  will continue to follow.  Follow-up for Phone Call        left message on machine ...........Marland KitchenMalachi Bonds  December 20, 2008 10:29 AM   spoke with patient aware of results.............Marland KitchenMalachi Bonds  December 22, 2008 2:36 PM

## 2010-05-25 NOTE — Progress Notes (Signed)
Summary: labs   Phone Note Outgoing Call   Call placed by: Malachi Bonds,  Sep 14, 2009 4:28 PM Call placed to: Patient Summary of Call: A1C is climbing despite med adjustments- will forward to endo.  pt needs to pay close attention to diet and try and get regular exercise.  LDL is climbing- needs to start Simvastatin 16m at bedtime.  recheck LFTs in 6-8 weeks  Follow-up for Phone Call        spoke w/ patient aware of labs and that she will need f/u w/ Dr. ECordelia Penoffice patient stated that she just started taking medication he prescribed last week.  So informed patient that she may be getting contacted by there office but says that she will call them. Also informed about cholesterol and says that she can't take any statins and would like to be started back on fenofibrate since this help w/ cholesterol in the past told patient that I will forward to Dr. TBirdie Riddlefor approval.............Marland KitchenMalachi Bonds Sep 14, 2009 4:30 PM   Additional Follow-up for Phone Call Additional follow up Details #1::        fenofibrate will help with triglycerides but will not dramatically improve cholesterol #s.  could start Welchol for cholesterol and DM control Additional Follow-up by: KAnnye AsaMD,  Sep 14, 2009 4:35 PM    Additional Follow-up for Phone Call Additional follow up Details #2::    spoke w/ patient aware of medication to be added........Marland KitchenMalachi Bonds Sep 20, 2009 4:01 PM   New/Updated Medications: WELCHOL 625 MG TABS (COLESEVELAM HCL) take 3 tablets two times a day Prescriptions: WELCHOL 625 MG TABS (COLESEVELAM HCL) take 3 tablets two times a day  #180 x 3   Entered by:   CMalachi Bonds  Authorized by:   KAnnye AsaMD   Signed by:   CMalachi Bondson 09/20/2009   Method used:   Electronically to        [facility] #[phone] (retail)       [address]      [place]  [address]      Ph: [phone]      Fax: [phone]  RxID:    [id number]

## 2010-05-25 NOTE — Letter (Signed)
Summary: Results Follow up Letter  [facility]   [place], [place] [address]   Phone: [phone]  Fax: [phone]    03/15/2009 MRN: [id number]  Breanna Webster [place], [place]  [address]  Dear [name],  The following are the results of your recent test(s):  Test         Result    Pap Smear:        Normal _____  Not Normal _____ Comments: ______________________________________________________ Cholesterol: LDL(Bad cholesterol):         Your goal is less than:         HDL (Good cholesterol):       Your goal is more than: Comments:  ______________________________________________________ Mammogram:        Normal _____  Not Normal _____ Comments:  ___________________________________________________________________ Hemoccult:        Normal _____  Not normal _______ Comments:    _____________________________________________________________________ Other Tests: Please see additional labs (Vit D) checked on 03/10/2009    We routinely do not discuss normal results over the telephone.  If you desire a copy of the results, or you have any questions about this information we can discuss them at your next office visit.   Sincerely,

## 2010-05-25 NOTE — Assessment & Plan Note (Signed)
Summary: followup/fasting//kn   Vital Signs:  Patient profile:   65 year old female Weight:      189 pounds Pulse rate:   70 / minute BP sitting:   130 / 80  (left arm)  Vitals Entered By: Malachi Bonds CMA (December 21, 2009 9:45 AM) CC: F/U AND FASTING LABS   History of Present Illness: 65 yo woman here today for  1) Hyperlipidemia- pt reports she is intolerant to statins, zetia, Welchol.  currently on fenofibrate.  due for LFTs.  denies abd pain, N/V, myalgias  2) DM- due for A1C today.  did not follow up w/ Dr Loanne Drilling as recommended.  3) Obesity- on wt watchers.  has lost 13lbs since summer started.  plans on starting exercise once it's cooler.  enjoys walking.   Current Medications (verified): 1)  Atenolol 25 Mg Tabs (Atenolol) .Marland Kitchen.. 1 By Mouth Once Daily 2)  Synthroid 150 Mcg Tabs (Levothyroxine Sodium) .... 0.81m Once Daily 3)  Nortriptyline Hcl 25 Mg Caps (Nortriptyline Hcl) ..Marland Kitchen. 1 By Mouth Once Daily 4)  Lexapro 20 Mg Tabs (Escitalopram Oxalate) ..Marland Kitchen. 1 1/2 By Mouth Once Daily 5)  Benicar 20 Mg Tabs (Olmesartan Medoxomil) ..Marland Kitchen. 1 By Mouth Once Daily 6)  Bayer Low Strength 81 Mg Tbec (Aspirin) ..Marland Kitchen. 1 By Mouth Once Daily 7)  Multivitamins  Tabs (Multiple Vitamin) ..Marland Kitchen. 1 By Mouth Once Daily 8)  Folic Acid 8478Mcg Tabs (Folic Acid) ..Marland Kitchen. 1 By Mouth Once Daily 9)  Vitamin C 500 Mg Tabs (Ascorbic Acid) ..Marland Kitchen. 1 By Mouth Once Daily 10)  Vitamin B-12 1000 Mcg Tabs (Cyanocobalamin) ..Marland Kitchen. 1 By Mouth Once Daily 11)  Fortical 200 Unit/act Soln (Calcitonin (Salmon)) ..Marland Kitchen. 1 Spray in Nostril (Alternate Nostrils) Daily 12)  Vitamin D3 5000 Units Tabs (Cholecalciferol) ..Marland Kitchen. 1 Once Daily 13)  Fish Oil  Oil (Fish Oil) .... Take One By Mouth Once Daily 14)  Fenofibrate Micronized 200 Mg Caps (Fenofibrate Micronized) .... Take One Tablet Daily 15)  Janumet 50-1000 Mg Tabs (Sitagliptin-Metformin Hcl) ..Marland Kitchen. 1 Tab By Mouth Two Times A Day 16)  Accu-Chek Comfort Curve  Strp (Glucose Blood) .... Use As  Directed  Allergies (verified): No Known Drug Allergies  Past History:  Past Medical History: Last updated: 06/01/2009 DM GERD Graves Disease  Review of Systems      See HPI  Physical Exam  General:  well-nourished,in no acute distress; alert,appropriate and cooperative throughout examination Neck:  No deformities, masses, or tenderness noted. Lungs:  Normal respiratory effort, chest expands symmetrically. Lungs are clear to auscultation, no crackles or wheezes Heart:  normal rate, regular rhythm, no gallop, no rub, no JVD, no HJR, and grade  2/6 systolic murmur  Abdomen:  soft, NT/ND, +BS Pulses:  +2 radial, carotid, DP/PT Extremities:  no C/C/E   Impression & Recommendations:  Problem # 1:  HYPERLIPIDEMIA (ICD-272.4) Assessment Unchanged tolerating fenofibrate.  check LFTs.  will recheck lipids in 3 months Her updated medication list for this problem includes:    Fenofibrate Micronized 200 Mg Caps (Fenofibrate micronized) ..Marland Kitchen.. Take one tablet daily  Orders: Venipuncture ((29562 TLB-Hepatic/Liver Function Pnl (80076-HEPATIC) Specimen Handling (99000)  Problem # 2:  DIABETES MELLITUS, UNCONTROLLED (ICD-250.02) Assessment: Unchanged due for A1C.  following w/ Endo but has not made appt as recommended.  pt to call Dr ELoanne Drilling The following medications were removed from the medication list:    Metformin Hcl 500 Mg Tabs (Metformin hcl) ..Marland Kitchen.. 2 by mouth two times a day    Januvia  100 Mg Tabs (Sitagliptin phosphate) .Marland Kitchen... 1 tab each am Her updated medication list for this problem includes:    Benicar 20 Mg Tabs (Olmesartan medoxomil) .Marland Kitchen... 1 by mouth once daily    Bayer Low Strength 81 Mg Tbec (Aspirin) .Marland Kitchen... 1 by mouth once daily    Janumet 50-1000 Mg Tabs (Sitagliptin-metformin hcl) .Marland Kitchen... 1 tab by mouth two times a day  Orders: TLB-A1C / Hgb A1C (Glycohemoglobin) (83036-A1C) TLB-BMP (Basic Metabolic Panel-BMET) (51761-YWVPXTG)  Problem # 3:  OBESITY  (ICD-278.00) Assessment: New on weight watchers.  plans to start exercising once weather cools.  will follow.  Complete Medication List: 1)  Atenolol 25 Mg Tabs (Atenolol) .Marland Kitchen.. 1 by mouth once daily 2)  Synthroid 150 Mcg Tabs (Levothyroxine sodium) .... 0.51m once daily 3)  Nortriptyline Hcl 25 Mg Caps (Nortriptyline hcl) ..Marland Kitchen. 1 by mouth once daily 4)  Lexapro 20 Mg Tabs (Escitalopram oxalate) ..Marland Kitchen. 1 1/2 by mouth once daily 5)  Benicar 20 Mg Tabs (Olmesartan medoxomil) ..Marland Kitchen. 1 by mouth once daily 6)  Bayer Low Strength 81 Mg Tbec (Aspirin) ..Marland Kitchen. 1 by mouth once daily 7)  Multivitamins Tabs (Multiple vitamin) ..Marland Kitchen. 1 by mouth once daily 8)  Folic Acid 8626Mcg Tabs (Folic acid) ..Marland Kitchen. 1 by mouth once daily 9)  Vitamin C 500 Mg Tabs (Ascorbic acid) ..Marland Kitchen. 1 by mouth once daily 10)  Vitamin B-12 1000 Mcg Tabs (Cyanocobalamin) ..Marland Kitchen. 1 by mouth once daily 11)  Fortical 200 Unit/act Soln (Calcitonin (salmon)) ..Marland Kitchen. 1 spray in nostril (alternate nostrils) daily 12)  Vitamin D3 5000 Units Tabs (cholecalciferol)  ..Marland Kitchen. 1 once daily 13)  Fish Oil Oil (Fish oil) .... Take one by mouth once daily 14)  Fenofibrate Micronized 200 Mg Caps (Fenofibrate micronized) .... Take one tablet daily 15)  Janumet 50-1000 Mg Tabs (Sitagliptin-metformin hcl) ..Marland Kitchen. 1 tab by mouth two times a day 16)  Accu-chek Comfort Curve Strp (Glucose blood) .... Use as directed  Patient Instructions: 1)  Please schedule your complete physical in 3 months- do not eat before this appt 2)  We'll notify you of your lab work 3)  Call Dr ELoanne Drillingand schedule an appt 4)  Call with any questions or concerns 5)  Keep up the good work on diet and exercise 6)  Happy Labor Day!

## 2010-05-25 NOTE — Procedures (Signed)
Summary: EGD   EGD  Procedure date:  03/18/2001  Findings:      530.2 Barretts Location: [facility]   Patient Name: Breanna Webster, Breanna Webster MRN:  Procedure Procedures: Panendoscopy (EGD) CPT: 56861.    with biopsy(s)/brushing(s). CPT: U5434024.  Personnel: Endoscopist: Sandy Salaam. Deatra Ina, MD.  Exam Location: Exam performed in Outpatient Clinic. Outpatient  Patient Consent: Procedure, Alternatives, Risks and Benefits discussed, consent obtained, from patient.  Indications Symptoms: Chest Pain.  History  Pre-Exam Physical: Performed Mar 18, 2001  Cardio-pulmonary exam, HEENT exam, Abdominal exam, Extremity exam, Neurological exam, Mental status exam WNL.  Exam Exam Info: Maximum depth of insertion Duodenum, intended Duodenum. ASA Classification: II. Tolerance: good.  Sedation Meds: Patient assessed and found to be appropriate for moderate (conscious) sedation. Fentanyl 50 mcg. Versed 7.5 mg. Cetacaine Spray 1 sprays Robinul 0.2  Monitoring: BP and pulse monitoring done. Oximetry used. Supplemental O2 given  Findings BARRETT'S ESOPHAGUS: Proximal margin 36 cm from mouth,  distal margin 40 cm. Biopsy/Barrett's taken. ICD9: Barrett's: 530.2. Comment: Irregular GE junction; r/o Barrett's.   Assessment Abnormal examination, see findings above.  Diagnoses: 530.2: Barrett's.   Events  Unplanned Intervention: No unplanned interventions were required.  Unplanned Events: There were no complications. Plans Medication(s): Await pathology. PPI: Omeprazole/Prilosec 20 mg BID, starting Mar 18, 2001  Other: Cardizem CD 132m QD, starting Mar 18, 2001   Disposition: After procedure patient sent to recovery. After recovery patient sent home.  [facility] Apr 15, 2001.    This report was created from the original endoscopy report, which was reviewed and signed by the above listed endoscopist.

## 2010-05-25 NOTE — Assessment & Plan Note (Signed)
Summary: NEW MEDICARE UNCONTROL DM PT-PKG-PER Breanna Webster/DR HOPPER-#--STC   Vital Signs:  Patient profile:   65 year old female Height:      66 inches (167.64 cm) Weight:      198 pounds (90.00 kg) O2 Sat:      96 % on Room air Temp:     97.8 degrees F (36.56 degrees C) oral Pulse rate:   73 / minute BP sitting:   138 / 88  (left arm) Cuff size:   regular  Vitals Entered By: Gardenia Phlegm RMA (August 05, 2009 8:27 AM)  O2 Flow:  Room air CC: New Endo: Uncontrolled Diabetes/ pt wanted me to take BP again and it was 160/82/ CF Is Patient Diabetic? Yes   Referring Provider:  Unice Cobble, MD Primary Provider:  Unice Cobble, MD  CC:  New Endo: Uncontrolled Diabetes/ pt wanted me to take BP again and it was 160/82/ CF.  History of Present Illness: pt states 10 years h/o dm.  she is unaware of any chronic complications.  she has never been on insulin.  she takes metformin.   no cbg record, but states cbg's vary from 110-200.  it is lowest in the afternoon. pt says his diet and exercise are not good.   symptomatically, pt states 6 mos of slight weight gain, but no numbness of the legs.  she has mild associated depression, but this is well-controlled. she is discouraged about weight gain over the past 6 months.   Current Medications (verified): 1)  Metformin Hcl 500 Mg Tabs (Metformin Hcl) .Marland Kitchen.. 1 By Mouth Am, 2 By Mouth Pm 2)  Atenolol 25 Mg Tabs (Atenolol) .Marland Kitchen.. 1 By Mouth Once Daily 3)  Synthroid 150 Mcg Tabs (Levothyroxine Sodium) .... 0.79m Once Daily 4)  Nortriptyline Hcl 25 Mg Caps (Nortriptyline Hcl) ..Marland Kitchen. 1 By Mouth Once Daily 5)  Lexapro 20 Mg Tabs (Escitalopram Oxalate) ..Marland Kitchen. 1 1/2 By Mouth Once Daily 6)  Benicar 20 Mg Tabs (Olmesartan Medoxomil) ..Marland Kitchen. 1 By Mouth Once Daily 7)  Bayer Low Strength 81 Mg Tbec (Aspirin) ..Marland Kitchen. 1 By Mouth Once Daily 8)  Multivitamins  Tabs (Multiple Vitamin) ..Marland Kitchen. 1 By Mouth Once Daily 9)  Folic Acid 8409Mcg Tabs (Folic Acid) ..Marland Kitchen. 1 By Mouth Once  Daily 10)  Vitamin C 500 Mg Tabs (Ascorbic Acid) ..Marland Kitchen. 1 By Mouth Once Daily 11)  Vitamin B-12 1000 Mcg Tabs (Cyanocobalamin) ..Marland Kitchen. 1 By Mouth Once Daily 12)  Fortical 200 Unit/act Soln (Calcitonin (Salmon)) ..Marland Kitchen. 1 Spray in Nostril (Alternate Nostrils) Daily 13)  Vitamin D3 5000 Units Tabs (Cholecalciferol) ..Marland Kitchen. 1 Once Daily 14)  Fish Oil  Oil (Fish Oil) .... Take One By Mouth Once Daily  Allergies (verified): No Known Drug Allergies  Past History:  Past Medical History: Last updated: 06/01/2009 DM GERD Graves Disease  Family History: Reviewed history from 12/13/2008 and no changes required. ADOPTED, but pt says a sister had dm CANCER, REFLUX,DM, HEART IN BIRTH FAM (? WHO)  Social History: Reviewed history from 06/01/2009 and no changes required. 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  Alcohol Use - no Daily Caffeine Use one per day Illicit Drug Use - no  Review of Systems       The patient complains of weight gain.  The patient denies fever.         denies blurry vision, headache, chest pain,  sob, n/v, urinary frequency, cramps, excessive diaphoresis, memory loss, hypoglycemia, rhinorrhea, and easy bruising.  she has "hot flashes" in the summer.    Physical Exam  General:  normal appearance.   Head:  head: no deformity eyes: no periorbital swelling, no proptosis external nose and ears are normal mouth: no lesion seen Neck:  Supple without thyroid enlargement or tenderness.  Lungs:  Clear to auscultation bilaterally. Normal respiratory effort.  Heart:  Regular rate and rhythm without murmurs or gallops noted. Normal S1,S2.   Msk:  muscle bulk and strength are grossly normal.  no obvious joint swelling.  gait is normal and steady  Pulses:  dorsalis pedis intact bilat.  no carotid bruit  Extremities:  no deformity.  no ulcer on the feet.   feet are of normal color and temp.  no edema  Neurologic:  cn 2-12 grossly intact.   readily moves all 4's.   sensation is intact to touch on the feet  Skin:  normal texture and temp.  no rash.  not diaphoretic  Cervical Nodes:  No significant adenopathy.  Psych:  Alert and cooperative; normal mood and affect; normal attention span and concentration.   Additional Exam:   Hemoglobin A1C       [H]  7.6 %  LDL Cholesterol           94 mg/dL    Impression & Recommendations:  Problem # 1:  DIABETES MELLITUS, UNCONTROLLED (ICD-250.02) needs increased rx  Problem # 2:  depression this could complicate the rx of #1, but it is well-controlled  Problem # 3:  HYPERLIPIDEMIA (ICD-272.4)  Medications Added to Medication List This Visit: 1)  Metformin Hcl 500 Mg Tabs (Metformin hcl) .... 2 by mouth two times a day 2)  Januvia 100 Mg Tabs (Sitagliptin phosphate) .Marland Kitchen.. 1 tab each am  Other Orders: Consultation Level IV (61443)  Patient Instructions: 1)  good diet and exercise habits significanly improve the control of your diabetes.  please let me know if you wish to be referred to a dietician.  high blood sugar is very risky to your health.  you should see an eye doctor every year. 2)  controlling your blood pressure and cholesterol drastically reduces the damage diabetes does to your body.  thsi also applies to quitting smoking.  please discuss these with your doctor.  you should take an aspirin every day, unless you have been advised by a doctor not to. 3)  add januvia 100 mg each am. 4)  ok to take alli--it is a good medication. 5)  increase metformin to 2x500 mg two times a day. 6)  check your blood sugar 1 time a day.  vary the time of day when you check, between before the 3 meals, and at bedtime.  also check if you have symptoms of your blood sugar being too high or too low.  please keep a record of the readings and bring it to your next appointment here.  please call us sooner if you are  having low blood sugar episodes. 7)  Please schedule a follow-up appointment in 3 months. 8)  here are some samples of januvia 100 mg, to takes each am in addition to the metformin. 9)  also, here are some samples of janumet 50/1000, to take 1 two times a day, to replace both the [place] and metformin  Preventive Care Screening  Mammogram:    Date:  04/23/2008    Results:  historical   Pap Smear:  Date:  04/23/2008    Results:  historical

## 2010-05-25 NOTE — Assessment & Plan Note (Signed)
Summary: f/u diabetes/#/cd   Vital Signs:  Patient profile:   65 year old female Height:      66 inches (167.64 cm) Weight:      187.50 pounds (85.23 kg) BMI:     30.37 O2 Sat:      97 % on Room air Temp:     97.9 degrees F (36.61 degrees C) oral Pulse rate:   70 / minute BP sitting:   122 / 80  (left arm) Cuff size:   regular  Vitals Entered By: Rebeca Alert MA (December 29, 2009 10:29 AM)  O2 Flow:  Room air CC: F/U on DM/aj Is Patient Diabetic? Yes   Referring Provider:  Unice Cobble, MD Primary Provider:  Unice Cobble, MD  CC:  F/U on DM/aj.  History of Present Illness: the status of at least 3 ongoing medical problems is addressed today: elev hepatic transaminases:  pt says she has long h/o nash.  no abd pain.  she says she does consumes neither alcohol nor tylenol pt says she has intermittent mild edema. dm:  she has lost weight due to her efforts.  she took actos some years ago, and is uncertain why she stopped it.   osteopenia:  pt says she does not take fortical, and asks that i remove it from her med list.    Current Medications (verified): 1)  Atenolol 25 Mg Tabs (Atenolol) .Marland Kitchen.. 1 By Mouth Once Daily 2)  Synthroid 150 Mcg Tabs (Levothyroxine Sodium) .... 0.21m Once Daily 3)  Nortriptyline Hcl 25 Mg Caps (Nortriptyline Hcl) ..Marland Kitchen. 1 By Mouth Once Daily 4)  Lexapro 20 Mg Tabs (Escitalopram Oxalate) ..Marland Kitchen. 1 1/2 By Mouth Once Daily 5)  Benicar 20 Mg Tabs (Olmesartan Medoxomil) ..Marland Kitchen. 1 By Mouth Once Daily 6)  Bayer Low Strength 81 Mg Tbec (Aspirin) ..Marland Kitchen. 1 By Mouth Once Daily 7)  Multivitamins  Tabs (Multiple Vitamin) ..Marland Kitchen. 1 By Mouth Once Daily 8)  Folic Acid 8888Mcg Tabs (Folic Acid) ..Marland Kitchen. 1 By Mouth Once Daily 9)  Vitamin C 500 Mg Tabs (Ascorbic Acid) ..Marland Kitchen. 1 By Mouth Once Daily 10)  Vitamin B-12 1000 Mcg Tabs (Cyanocobalamin) ..Marland Kitchen. 1 By Mouth Once Daily 11)  Fortical 200 Unit/act Soln (Calcitonin (Sal[place])) ..Marland Kitchen. 1 Spray in Nostril (Alternate Nostrils) Daily 12)   Vitamin D3 5000 Units Tabs (Cholecalciferol) ..Marland Kitchen. 1 Once Daily 13)  Fish Oil  Oil (Fish Oil) .... Take One By Mouth Once Daily 14)  Fenofibrate Micronized 200 Mg Caps (Fenofibrate Micronized) .... Take One Tablet Daily 15)  Janumet 50-1000 Mg Tabs (Sitagliptin-Metformin Hcl) ..Marland Kitchen. 1 Tab By Mouth Two Times A Day 16)  Accu-Chek Comfort Curve  Strp (Glucose Blood) .... Use As Directed  Allergies (verified): No Known Drug Allergies  Past History:  Past Medical History: Last updated: 06/01/2009 DM GERD Graves Disease  Review of Systems       she has slight nausea.  no vomiting.  Physical Exam  General:  normal appearance.   Extremities:  no edema Additional Exam:  a1c=6.7 Total Bilirubin           0.8 mg/dL                   0.3-1.2   Direct Bilirubin          0.2 mg/dL                   0.0-0.3   Alkaline Phosphatase      42 U/L  39-117   AST                  [H]  41 U/L                      0-37   ALT                  [H]  43 U/L                      0-35   Total Protein             7.3 g/dL                    6.0-8.3   Albumin                   4.4 g/dL       Impression & Recommendations:  Problem # 1:  DIABETES MELLITUS, UNCONTROLLED (ICD-250.02) Assessment Improved HgbA1C: 6.7 (12/21/2009)  Problem # 2:  FATTY LIVER DISEASE (ICD-571.8) Assessment: Unchanged  Problem # 3:  OSTEOPENIA (ICD-733.90) she declines fortical  Problem # 4:  edema mild. actos would help #2, but edema is a relative contraindication to this.  Other Orders: TLB-Hepatic/Liver Function Pnl (80076-HEPATIC) Est. Patient Level IV (29562)  Patient Instructions: 1)  try taking janumet with food, to minimize nausea. 2)  continue you efforts with weight-watchers. 3)  another option is re-adding actos, for fatty liver and a1c.   4)  Please schedule a follow-up appointment in 6 months. 5)  blood tests are being ordered for you today.  please call [phone] to hear your test  results.

## 2010-05-25 NOTE — Assessment & Plan Note (Signed)
Summary: strep throat/kdc   Vital Signs:  Patient profile:   65 year old female Weight:      191.6 pounds Temp:     99.3 degrees F oral Pulse rate:   76 / minute Resp:     16 per minute BP sitting:   140 / 76  (left arm) Cuff size:   large  Vitals Entered By: Georgette Dover (May 06, 2009 11:58 AM) CC: Sore throat, cough(productive-green), & diarrhea X 5 Weeks Comments REVIEWED MED LIST, PATIENT AGREED DOSE AND INSTRUCTION CORRECT    CC:  Sore throat, cough(productive-green), and & diarrhea X 5 Weeks.  History of Present Illness: Onset as ST  after Christmas which has persisted ( actually seen in 02/2009 for sinusitis) , no better with  3  antibiotics: Augmentin , Metronidazole & then Zithromax. R earache > L. Now up to 3 BMs / day.  Anticoagulation Management History:      Positive risk factors for bleeding include an age of 68 years or older and presence of serious comorbidities.  The bleeding index is 'intermediate risk'.  Positive CHADS2 values include History of Diabetes.  Negative CHADS2 values include Age > 69 years old.     Allergies (verified): No Known Drug Allergies  Review of Systems General:  Denies chills, fever, and sweats. ENT:  Complains of earache and nasal congestion; denies ear discharge and sinus pressure; Purulent nasal D/C; no frontal headache or facial pain. Resp:  Denies cough and sputum productive; She expectorates green from [place]. GI:  Complains of diarrhea; loose to watery stool with urgency; IBS several  months.  Physical Exam  General:  well-nourished,in no acute distress; alert,appropriate and cooperative throughout examination Eyes:  No corneal or conjunctival inflammation noted.no icterus. [place]. Ears:  External ear exam shows no significant lesions or deformities.  Otoscopic examination reveals clear canals, tympanic membranes are intact bilaterally without bulging, retraction, inflammation or discharge. Hearing is grossly normal  bilaterally. Nose:  External nasal examination shows no deformity or inflammation. Nasal mucosa are pink and moist without lesions or exudates. Mouth:  Oral mucosa and oropharynx without lesions or exudates.  Marked drying of oropharynx yet tongue moist. Hoarse Lungs:  Normal respiratory effort, chest expands symmetrically. Lungs are clear to auscultation, no crackles or wheezes but dry cough Abdomen:  Bowel sounds positive,abdomen soft and non-tender without masses, organomegaly or hernias noted. Skin:  Intact without suspicious lesions or rashes. No tenting or jaundice Cervical Nodes:  No lymphadenopathy noted Axillary Nodes:  No palpable lymphadenopathy   Impression & Recommendations:  Problem # 1:  PHARYNGITIS-ACUTE (ICD-462)  Beta Strep negative The following medications were removed from the medication list:    Metronidazole 500 Mg Tabs (Metronidazole) .Marland Kitchen... 1 three times a day ; avoid alcohol    Zithromax Tri-pak 500 Mg Tabs (Azithromycin) ..... Use as directed Her updated medication list for this problem includes:    Bayer Low Strength 81 Mg Tbec (Aspirin) .Marland Kitchen... 1 by mouth once daily  Orders: Venipuncture (81275) TLB-CBC Platelet - w/Differential (85025-CBCD)  Problem # 2:  DIARRHEA (ICD-787.91)  in context of PMH of IBS & multiple antibiotics; R/O C difficle colitis  Orders: Venipuncture (17001) TLB-CBC Platelet - w/Differential (85025-CBCD) TLB-Creatinine, Blood (82565-CREA) TLB-Potassium (K+) (84132-K) TLB-BUN (Urea Nitrogen) (84520-BUN) T-Culture, C-Diff Toxin A/B (74944-96759) T-Culture, Stool (87045/87046-70140) Gastroenterology Referral (GI)  Problem # 3:  IBS (ICD-564.1)  Orders: Venipuncture (16384) Gastroenterology Referral (GI)  Problem # 4:  SINUSITIS- ACUTE-NOS (ICD-461.9)  AS per history despite 3 antibiotics  The following medications were removed from the medication list:    Metronidazole 500 Mg Tabs (Metronidazole) .Marland Kitchen... 1 three times a day ;  avoid alcohol    Zithromax Tri-pak 500 Mg Tabs (Azithromycin) ..... Use as directed  Orders: Venipuncture (69629) TLB-CBC Platelet - w/Differential (85025-CBCD) Radiology Referral (Radiology)  Complete Medication List: 1)  Metformin Hcl 500 Mg Tabs (Metformin hcl) .Marland Kitchen.. 1 by mouth am, 2 by mouth pm 2)  Atenolol 25 Mg Tabs (Atenolol) .Marland Kitchen.. 1 by mouth once daily 3)  Synthroid 150 Mcg Tabs (Levothyroxine sodium) .... 0.66m once daily 4)  Nortriptyline Hcl 25 Mg Caps (Nortriptyline hcl) ..Marland Kitchen. 1 by mouth once daily 5)  Lexapro 20 Mg Tabs (Escitalopram oxalate) ..Marland Kitchen. 1 1/2 by mouth once daily 6)  Benicar 20 Mg Tabs (Olmesartan medoxomil) ..Marland Kitchen. 1 by mouth once daily 7)  Bayer Low Strength 81 Mg Tbec (Aspirin) ..Marland Kitchen. 1 by mouth once daily 8)  Multivitamins Tabs (Multiple vitamin) ..Marland Kitchen. 1 by mouth once daily 9)  Folic Acid 8528Mcg Tabs (Folic acid) ..Marland Kitchen. 1 by mouth once daily 10)  Vitamin C 500 Mg Tabs (Ascorbic acid) ..Marland Kitchen. 1 by mouth once daily 11)  Vitamin B-12 1000 Mcg Tabs (Cyanocobalamin) ..Marland Kitchen. 1 by mouth once daily 12)  Fortical 200 Unit/act Soln (Calcitonin (salmon)) ..Marland Kitchen. 1 spray in nostril (alternate nostrils) daily 13)  Tylenol With Codeine #3 300-30 Mg Tabs (Acetaminophen-codeine) .... Take 1-2 q 6hrs as needed 14)  Vitamin D3 5000 Unit/ml Liqd (Cholecalciferol) ..Marland Kitchen. 1 once daily 15)  Vitamin D3 5000 Unit/ml Liqd (Cholecalciferol) ..Marland Kitchen. 1 once daily  Other Orders: Rapid Strep ((41324  Anticoagulation Management Assessment/Plan:            Patient Instructions: 1)  Immodium AD as needed for ffrankly watery stool. Align once daily until bowels normal. Use Magic  mouthwash as prescribed. Zinc lozenges as needed  for sore throat . ENT consult if no better  Laboratory Results    Other Tests  Rapid Strep: negative   Appended Document: strep throat/kdc

## 2010-05-25 NOTE — Progress Notes (Signed)
Summary: Med Side Effects  Phone Note Call from Patient Call back at Home Phone [phone]   Caller: Patient Summary of Call: Patient and called and left message saying that her Earnestine Mealing is making her nauseous and she has problems with statin drugs. She would like an alternative medication.   Call back #: [phone] Initial call taken by: Elna Breslow,  October 18, 2009 11:06 AM  Follow-up for Phone Call        can do Zetia 10 mg daily. Follow-up by: Annye Asa MD,  October 18, 2009 11:25 AM  Additional Follow-up for Phone Call Additional follow up Details #1::        left message on machine ...........Marland KitchenMalachi Bonds  October 18, 2009 1:54 PM   spoke w/ patient husband informed that I will leave samples up front for patient to pick up before we call in prescription to see how patient tolerates........Marland KitchenMalachi Bonds  October 19, 2009 4:29 PM     New/Updated Medications: ZETIA 10 MG TABS (EZETIMIBE) take one tablet daily

## 2010-05-25 NOTE — Letter (Signed)
Summary: [facility]  [facility]   Imported By: Edmonia James 09/28/2009 12:15:35  _____________________________________________________________________  External Attachment:    Type:   Image     Comment:   External Document

## 2010-05-25 NOTE — Consult Note (Signed)
Summary: [facility] By: Edmonia James 10/03/2009 11:15:47  _____________________________________________________________________  External Attachment:    Type:   Image     Comment:   External Document

## 2010-05-25 NOTE — Progress Notes (Signed)
Summary: labs  Phone Note Outgoing Call   Call placed by: Malachi Bonds CMA,  March 27, 2010 2:08 PM Call placed to: Patient Summary of Call: level is low.  should start 50,000 units Vit D weekly x12 weeks and then recheck  Follow-up for Phone Call        left message on machine ........Marland KitchenMalachi Bonds CMA  March 27, 2010 2:08 PM   Additional Follow-up for Phone Call Additional follow up Details #1::        Pt aware rx sent to pharmacy...........Marland KitchenFelecia Deloach CMA  March 27, 2010 3:07 PM     New/Updated Medications: VITAMIN D (ERGOCALCIFEROL) 50000 UNIT CAPS (ERGOCALCIFEROL) Take 1 tab weekly for 12 weeks then recheck level Prescriptions: VITAMIN D (ERGOCALCIFEROL) 50000 UNIT CAPS (ERGOCALCIFEROL) Take 1 tab weekly for 12 weeks then recheck level  #4 x 3   Entered by:   Rolla Flatten CMA   Authorized by:   Annye Asa MD   Signed by:   Rolla Flatten CMA on 03/27/2010   Method used:   Faxed to ...       [facility]  [place]. [phone]* (retail)       [address]       [place], [place]  [address]       Ph: [phone]       Fax: [phone]   RxID:   [id number]

## 2010-05-25 NOTE — Letter (Signed)
Summary: [facility]  [facility]   Imported By: Edmonia James 02/15/2010 10:03:00  _____________________________________________________________________  External Attachment:    Type:   Image     Comment:   External Document

## 2010-05-25 NOTE — Progress Notes (Signed)
Summary: MEDICATION NOT WORKING--NEEDS SOMETHING DIFFERENT  Phone Note Call from Patient Call back at Home Phone [phone] Call back at [facility] - [phone]   Caller: Patient Summary of Call: Harbor (DIDNT KNOW NAME) GIVEN TO HER END OF LAST WEEK IS NOT WORKING AT ALL----THROAT IS STILL QUITE SORE, NOSE IS DRIPPY, IT IS "BACKED UP" INTO HER EAR  NEEDS DR HOPPER TO CALL IN A DIFFERENT MEDICATION SINCE THIS ONE "IS NOT WORKING"  [facility]  Initial call taken by: Berneta Sages,  May 02, 2009 9:40 AM  Follow-up for Phone Call        pt rx METRONIDAZOLE 500 MG TABS, dr hopper pls advise..........Marland KitchenFelecia Deloach CMA  May 02, 2009 10:11 AM   Additional Follow-up for Phone Call Additional follow up Details #1::        generic Zpack #1 Additional Follow-up by: Unice Cobble MD,  May 02, 2009 10:20 AM    Additional Follow-up for Phone Call Additional follow up Details #2::    rx faxed,left msg for pt Follow-up by: Verdie Mosher,  May 02, 2009 1:02 PM  New/Updated Medications: ZITHROMAX TRI-PAK 500 MG TABS (AZITHROMYCIN) use as directed Prescriptions: ZITHROMAX TRI-PAK 500 MG TABS (AZITHROMYCIN) use as directed  #1 x 0   Entered by:   Verdie Mosher   Authorized by:   Unice Cobble MD   Signed by:   Verdie Mosher on 05/02/2009   Method used:   Faxed to ...       [facility]  [place]. [phone]* (retail)       [address]       [place], [place]  [address]       Ph: [phone]       Fax: [phone]   RxID:   [id number]

## 2010-05-25 NOTE — Progress Notes (Signed)
Summary: Questions about fenofibrate samples  Phone Note Call from Patient   Caller: Patient Call For: Annye Asa MD Details for Reason: Questions about samples  Summary of Call: Spk with pt and stated that she was given samples of fenofibrate 137m and 1461m( samples came from endocrinologiat), pt says she is in the donut hole and wanted to know if it was ok that she takes these samples until she can get the 20047milled.c/b # [id number] avise   Initial call taken by: KimAron BabaA (AADeborra Medina January 30, 2010 10:52 AM  Follow-up for Phone Call        yes.  any meds are better than no meds. Follow-up by: KatAnnye Asa,  January 30, 2010 11:11 AM  Additional Follow-up for Phone Call Additional follow up Details #1::        spoke w/ patient aware of instructions.........ChMarland KitchenMalachi BondsA  January 30, 2010 4:22 PM

## 2010-05-25 NOTE — Assessment & Plan Note (Signed)
Summary: RIGHT KNEE PAIN--PH   Vital Signs:  Patient profile:   65 year old female Weight:      195 pounds BP sitting:   140 / 80  (left arm)  Vitals Entered By: Malachi Bonds (Sep 12, 2009 2:59 PM) CC: R knee pain and R hand tender and painful    History of Present Illness: 65 yo woman here today for  R knee pain-pt feels she has a cyst in her knee, if she bumps area 'it's the kind of pain that you see stars'.  'i've been putting it off for a while', knee pain first started months ago.  has good relief w/ ibuprofen.  has been taking 2-3 ibuprofen every 8-12 hrs.  no pain today, reports this is due to the ibuprofen she took  R hand pain- small firm swelling in palm of R hand, very painful to touch.  some discoloration.  first appeared 1 week ago.  ? movement.  + enlarging.  also complains of bilateral hand arthritis  Current Medications (verified): 1)  Metformin Hcl 500 Mg Tabs (Metformin Hcl) .... 2 By Mouth Two Times A Day 2)  Atenolol 25 Mg Tabs (Atenolol) .Marland Kitchen.. 1 By Mouth Once Daily 3)  Synthroid 150 Mcg Tabs (Levothyroxine Sodium) .... 0.33m Once Daily 4)  Nortriptyline Hcl 25 Mg Caps (Nortriptyline Hcl) ..Marland Kitchen. 1 By Mouth Once Daily 5)  Lexapro 20 Mg Tabs (Escitalopram Oxalate) ..Marland Kitchen. 1 1/2 By Mouth Once Daily 6)  Benicar 20 Mg Tabs (Olmesartan Medoxomil) ..Marland Kitchen. 1 By Mouth Once Daily 7)  Bayer Low Strength 81 Mg Tbec (Aspirin) ..Marland Kitchen. 1 By Mouth Once Daily 8)  Multivitamins  Tabs (Multiple Vitamin) ..Marland Kitchen. 1 By Mouth Once Daily 9)  Folic Acid 8094Mcg Tabs (Folic Acid) ..Marland Kitchen. 1 By Mouth Once Daily 10)  Vitamin C 500 Mg Tabs (Ascorbic Acid) ..Marland Kitchen. 1 By Mouth Once Daily 11)  Vitamin B-12 1000 Mcg Tabs (Cyanocobalamin) ..Marland Kitchen. 1 By Mouth Once Daily 12)  Fortical 200 Unit/act Soln (Calcitonin (Salmon)) ..Marland Kitchen. 1 Spray in Nostril (Alternate Nostrils) Daily 13)  Vitamin D3 5000 Units Tabs (Cholecalciferol) ..Marland Kitchen. 1 Once Daily 14)  Fish Oil  Oil (Fish Oil) .... Take One By Mouth Once Daily 15)  Januvia 100  Mg Tabs (Sitagliptin Phosphate) ..Marland Kitchen. 1 Tab Each Am  Allergies (verified): No Known Drug Allergies  Review of Systems      See HPI  Physical Exam  General:  well-nourished,in no acute distress; alert,appropriate and cooperative throughout examination Msk:  no TTP over R knee.  good extension/flexion.  (-) patellar grind.  no pain when hit w/ reflex hammer Pulses:  +2 radial, ulnar, DP/PT Extremities:  small, firm area on palm of R hand located at base of R thumb.  some discoloration.  TTP   Impression & Recommendations:  Problem # 1:  HAND PAIN (ICD-729.5) Assessment New  area on R palm consistent w/ cyst but discoloration suggests vascular involvement.  will refer to hand specialist.  continue ibuprofen for pain relief.  Orders: Orthopedic Referral (Ortho)  Problem # 2:  KNEE PAIN ((BSJ-628.36 Assessment: New  no pain during exam today but pt reports this is ongoing problem.  will refer to ortho for evaluation and tx.  continue ibuprofen as this works well for pt. Her updated medication list for this problem includes:    Bayer Low Strength 81 Mg Tbec (Aspirin) ..Marland Kitchen.. 1 by mouth once daily  Orders: Orthopedic Referral (Ortho)  Complete Medication List: 1)  Metformin Hcl 500  Mg Tabs (Metformin hcl) .... 2 by mouth two times a day 2)  Atenolol 25 Mg Tabs (Atenolol) .Marland Kitchen.. 1 by mouth once daily 3)  Synthroid 150 Mcg Tabs (Levothyroxine sodium) .... 0.3m once daily 4)  Nortriptyline Hcl 25 Mg Caps (Nortriptyline hcl) ..Marland Kitchen. 1 by mouth once daily 5)  Lexapro 20 Mg Tabs (Escitalopram oxalate) ..Marland Kitchen. 1 1/2 by mouth once daily 6)  Benicar 20 Mg Tabs (Olmesartan medoxomil) ..Marland Kitchen. 1 by mouth once daily 7)  Bayer Low Strength 81 Mg Tbec (Aspirin) ..Marland Kitchen. 1 by mouth once daily 8)  Multivitamins Tabs (Multiple vitamin) ..Marland Kitchen. 1 by mouth once daily 9)  Folic Acid 8258Mcg Tabs (Folic acid) ..Marland Kitchen. 1 by mouth once daily 10)  Vitamin C 500 Mg Tabs (Ascorbic acid) ..Marland Kitchen. 1 by mouth once daily 11)  Vitamin  B-12 1000 Mcg Tabs (Cyanocobalamin) ..Marland Kitchen. 1 by mouth once daily 12)  Fortical 200 Unit/act Soln (Calcitonin (salmon)) ..Marland Kitchen. 1 spray in nostril (alternate nostrils) daily 13)  Vitamin D3 5000 Units Tabs (cholecalciferol)  ..Marland Kitchen. 1 once daily 14)  Fish Oil Oil (Fish oil) .... Take one by mouth once daily 15)  Januvia 100 Mg Tabs (Sitagliptin phosphate) ..Marland Kitchen. 1 tab each am  Patient Instructions: 1)  Once I have your lab results I will review them and contact you about your next appt 2)  Someone will call you with both your hand referral and your orthopedic appts 3)  Continue the Ibuprofen since it is working- 400-6042mevery 6 hours or 80044mvery 8 hours.  take w/ food to avoid upset stomach 4)  If you develop streaking redness, increased pain, or other concerns about your hand- please call 5)  Hang in there!!

## 2010-05-25 NOTE — Progress Notes (Signed)
  Phone Note Refill Request Message from:  Pharmacy  [facility] [address] Requested: Medication #1:  LOFIBRA 200 MG CAPS 1 by mouth once daily PT USES LOCAL PHARMACY NOW AND WANTS REFILL  Initial call taken by: Verdie Mosher,  January 03, 2009 12:42 PM Caller: Joanell Rising FRIENDLY    Prescriptions: LOFIBRA 200 MG CAPS (FENOFIBRATE MICRONIZED) 1 by mouth once daily  #30 x 2   Entered by:   Verdie Mosher   Authorized by:   Annye Asa MD   Signed by:   Verdie Mosher on 01/03/2009   Method used:   Faxed to ...       [facility]  [place]. [phone]* (retail)       [address]       [place], [place]  [address]       Ph: [phone]       Fax: [phone]   RxID:   [id number]

## 2010-05-25 NOTE — Progress Notes (Signed)
Summary: NEEDS DIFFERENT SAMPLES--HAS A REACTION  Phone Note Call from Patient Call back at Home Phone [phone]   Caller: Patient Summary of Call: PATIENT CAME IN TO [place] LOOKED IN BAG, SHE SAID SHE CANT TAKE IT BECAUSE IT MAKES HER COUGH; ALSO REPORTD THAT [facility] GIVES HER AN UPSET STOMACH  SAID HER OLD DOCTOR HAD HER ON PHENOFIBRATE (SP??)---SAID IT KEPT HER TRIGLYSERIDES AND CHOLESTEROL AT ACCEPTABLE LEVELS, BUT THEN STATED THAT HE TOOK HER OFF OF THIS MEDICATION BECAUSE HER TRIGYLS AND CHOL NUMBERS WERE GOOD  SHE NEEDS THIS TO BE RESOLVED BY FRIDAY 10/21/2009 BECAUSE SHE IS LEAVING EARLY ON TUESDAY, [place] Initial call taken by: Berneta Sages,  October 19, 2009 5:18 PM  Follow-up for Phone Call        restart fenofibrate 166m daily since this has worked for pt in the past.  recheck LFTs in 6-8 weeks. Follow-up by: KAnnye AsaMD,  October 20, 2009 2:31 PM    New/Updated Medications: FENOFIBRATE MICRONIZED 200 MG CAPS (FENOFIBRATE MICRONIZED) take one tablet daily Prescriptions: FENOFIBRATE MICRONIZED 200 MG CAPS (FENOFIBRATE MICRONIZED) take one tablet daily  #30 x 3   Entered by:   CMalachi Bonds  Authorized by:   KAnnye AsaMD   Signed by:   CMalachi Bondson 10/21/2009   Method used:   Electronically to        [facility] #[id number] (retail)       [address]      [place]  [address]      Ph: [phone]      Fax: [phone]  RxID:   [id number]

## 2010-05-25 NOTE — Assessment & Plan Note (Signed)
Summary: FLU SHOT///SPH  Nurse Visit   Allergies: No Known Drug Allergies  Orders Added: 1)  Flu Vaccine 10yr + MEDICARE PATIENTS [Q2039] 2)  Administration Flu vaccine - MCR [[R4854]Flu Vaccine Consent Questions     Do you have a history of severe allergic reactions to this vaccine? no    Any prior history of allergic reactions to egg and/or gelatin? no    Do you have a sensitivity to the preservative Thimersol? no    Do you have a past history of Guillan-Barre Syndrome? no    Do you currently have an acute febrile illness? no    Have you ever had a severe reaction to latex? no    Vaccine information given and explained to patient? yes    Are you currently pregnant? no    Lot Number:[id number]   Exp Date:10/21/2010   Manufacturer: [employer]   Site Given  Left Deltoid IM.medflu

## 2010-05-25 NOTE — Progress Notes (Signed)
Summary: NEEDS FAXED NOTE TODAY FOR JURY DUTY EXCUSE  Phone Note Call from Patient Call back at Home Phone [phone]   Caller: Patient Complaint: Urinary/GYN Problems Summary of Call: PATIENT WENT TO [place] AND HAD A PROBLEM---SAYS SHE HAS IRRITABLE BOWEL SYNDROME AND NEEDS DR HOPPER TO WRITE HER A NOTE TO EXCUSE HER FROM JURY DUTY BECAUSE OF THIS PROBLEM  PLEASE FAX TO:   [place] = CLERK OF [address]--FAX NUMBER = [phone]  THEN CALL PATIENT AT [phone] TO CONFIRM THAT FAX HAS BEEN SENT--OK TO LEAVE MESSAGE IF SHE IS NOT THERE Initial call taken by: Berneta Sages,  May 19, 2009 10:38 AM  Follow-up for Phone Call        dr hopper pls advise ok to write letter.....................Marland KitchenFelecia Deloach CMA  May 19, 2009 10:45AM   Additional Follow-up for Phone Call Additional follow up Details #1::        To Whom It May Concern: Breanna Webster has a documented diagnosis of Irritable Bowel Syndrome. It would be difficult for her to serve on a jury because of  inability to schedule bathroom visits.  Additional Follow-up by: Unice Cobble MD,  May 19, 2009 6:09 PM    Additional Follow-up for Phone Call Additional follow up Details #2::    letter faxed per pt request, inform pt husband to notify pt letter faxed and if she has any further issue to give [name] a call back...............................Marland KitchenFelecia Deloach CMA  May 20, 2009 8:36 AM

## 2010-05-25 NOTE — Progress Notes (Signed)
Summary: [facility]  [place]   Imported By: Phillis Knack 06/02/2009 09:42:49  _____________________________________________________________________  External Attachment:    Type:   Image     Comment:   External Document

## 2010-05-25 NOTE — Letter (Signed)
Summary: [facility] CT Imaging Options Form/[place] [facility]  M&M CT Imaging Options Form/[place]   Imported By: Edmonia James 05/11/2009 13:24:54  _____________________________________________________________________  External Attachment:    Type:   Image     Comment:   External Document

## 2010-05-25 NOTE — Progress Notes (Signed)
Summary: med not working  Phone Note Call from Patient Call back at [employer] [phone]   Summary of Call: pt states that she was given PROPOXYPHENE N-APAP 100-650 MG TABS for headache and this med is not helping. pt c/o constant headache with facial pain. pt states that the infection is clearing up but the headache will not go away. pt would like to know if you can rx another med. pt states that she does have a history with migraine.pt [facility]...................Marland KitchenFelecia Deloach CMA  March 11, 2009 3:32 PM   Follow-up for Phone Call        Tylenol #3 , 1-2 q 6hrs as needed #30. CT of head if no better Follow-up by: Unice Cobble MD,  March 11, 2009 4:52 PM  Additional Follow-up for Phone Call Additional follow up Details #1::        pt want to let you know that PROPOXYPHENE N-APAP 100-650 MG TABS  was taking off the  market and it is on the front page of times..............Marland KitchenFelecia Deloach CMA  March 11, 2009 5:18 PM     New/Updated Medications: TYLENOL WITH CODEINE #3 300-30 MG TABS (ACETAMINOPHEN-CODEINE) take 1-2 q 6hrs as needed Prescriptions: TYLENOL WITH CODEINE #3 300-30 MG TABS (ACETAMINOPHEN-CODEINE) take 1-2 q 6hrs as needed  #30 x 0   Entered by:   Rolla Flatten CMA   Authorized by:   Unice Cobble MD   Signed by:   Rolla Flatten CMA on 03/11/2009   Method used:   Printed then faxed to ...       [facility]  [place]. [phone]* (retail)       [address]       [place], [place]  [address]       Ph: [phone]       Fax: [phone]   RxID:   [id number]

## 2010-05-25 NOTE — Procedures (Signed)
Summary: [facility]  [facility]   Imported By: Edmonia James 12/22/2008 09:09:35  _____________________________________________________________________  External Attachment:    Type:   Image     Comment:   External Document

## 2010-05-25 NOTE — Progress Notes (Signed)
Summary: Vit d Concerns   Phone Note Call from Patient Call back at Work Phone [phone]   Caller: Patient Summary of Call: Message left on VM: Patient would like a call to clarify Vit D. Patient said she is already taking 5000 international units and Dr.Hopper told her to add 1000 international units and she is not sure if he was aware that she is already taking 5000 international units.   Breanna Webster  July 27, 2009 1:49 PM   Follow-up for Phone Call        Spoke with patient, patient aware to add 1000 and to make sure she is using Vit D3.  Follow-up by: Georgette Dover,  July 27, 2009 1:52 PM

## 2010-05-25 NOTE — Letter (Signed)
Summary: [facility]  [facility]   Imported By: Edmonia James 08/17/2009 12:44:20  _____________________________________________________________________  External Attachment:    Type:   Image     Comment:   External Document

## 2010-05-25 NOTE — Letter (Signed)
Summary: Generic Letter  [facility]   [place], [place] [address]   Phone: [phone]  Fax: [phone]    05/20/2009      To Whom It May Concern:  Mrs. Breanna Webster has a documented diagnosis of irritable Bowel Syndrome. It would be difficult for her to serve on a jury because of inability to schedule bathroom visits.          Sincerely,       Gwyndolyn Saxon Hopper,MD

## 2010-05-25 NOTE — Miscellaneous (Signed)
  Clinical Lists Changes  Observations: Added new observation of VIT D 25-OH: 24 ng/mL (09/08/2008 11:55) Added new observation of FOLATE: 24.0 ng/mL (09/08/2008 11:55) Added new observation of B-12: 1038 pg/mL (09/08/2008 11:55) Added new observation of FERRITIN: 122 ng/mL (09/08/2008 11:55) Added new observation of TSH: 1.83 microintl units/mL (09/08/2008 11:55) Added new observation of TRIGLYC TOT: 89 mg/dL (09/08/2008 11:55) Added new observation of LDL: 96 mg/dL (09/08/2008 11:55) Added new observation of HDL: 39 mg/dL (09/08/2008 11:55) Added new observation of CHOLESTEROL: 153 mg/dL (09/08/2008 11:55) Added new observation of BILI TOTAL: 0.7 mg/dL (09/08/2008 11:55) Added new observation of ALK PHOS: 53 units/L (09/08/2008 11:55) Added new observation of SGPT (ALT): 60 units/L (09/08/2008 11:55) Added new observation of SGOT (AST): 56 units/L (09/08/2008 11:55) Added new observation of PROTEIN, TOT: 7.1 g/dL (09/08/2008 11:55) Added new observation of GLOBULIN TOT: 2.6 g/dL (09/08/2008 11:55) Added new observation of ALBUMIN: 4.5 g/dL (09/08/2008 11:55) Added new observation of CALCIUM: 9.9 mg/dL (09/08/2008 11:55) Added new observation of HGBA1C: 6.3 % (09/08/2008 11:55) Added new observation of GLUCOSE SER: 135 mg/dL (09/08/2008 11:55) Added new observation of CREATININE: 0.70 mg/dL (09/08/2008 11:55) Added new observation of BUN: 14 mg/dL (09/08/2008 11:55) Added new observation of CO2 TOTAL: 23 mmol/L (09/08/2008 11:55) Added new observation of CHLORIDE: 105 mmol/L (09/08/2008 11:55) Added new observation of POTASSIUM: 4.3 mmol/L (09/08/2008 11:55) Added new observation of SODIUM: 140 mmol/L (09/08/2008 11:55) Added new observation of PLATELETS: 314 10*3/mm3 (09/08/2008 11:55) Added new observation of MCH: 20.4 pg (09/08/2008 11:55) Added new observation of MCV: 65.9 fL (09/08/2008 11:55) Added new observation of HCT: 37.2 % (09/08/2008 11:55) Added new observation of HGB:  11.5 g/dL (09/08/2008 11:55) Added new observation of RBC: 5.64 10*6/mm3 (09/08/2008 11:55) Added new observation of WBC: 6.2 10*3/mm3 (09/08/2008 11:55) Added new observation of BD LTFEMNECK: -0.5  (10/13/2007 16:59) Added new observation of BD L1-L4 T: -1.7  (10/13/2007 16:59) Added new observation of BONE DENSITY: osteopenia std dev (10/13/2007 16:59) Added new observation of BONE DENSITY: abnormal std dev (10/13/2007 16:59) Added new observation of COLONOSCOPY: normal-internal hemorrhoids,diverticula  (09/01/2003 16:59)      Preventive Care Screening  T-score L femur:    Date:  10/13/2007    Results:  -0.5   T-score L-Spine:    Date:  10/13/2007    Results:  -1.7   Bone Density:    Date:  10/13/2007    Results:  abnormal std dev  Colonoscopy:    Date:  09/01/2003    Results:  normal-internal hemorrhoids,diverticula

## 2010-05-25 NOTE — Progress Notes (Signed)
Summary: waiting period between labs??  Phone Note Call from Patient Call back at Home Phone [phone]   Caller: Patient Summary of Call: pt has an appt scheduled for a fasting followup on 8/31. pt states that there may be a waiting period between her labs and isnt sure how long it is. I advised maybe to call her insurance company to see if they could help her. pt states you know the answer. please advise.  Initial call taken by: Osborn Coho,  November 21, 2009 12:47 PM  Follow-up for Phone Call        she should not get fasting labs done on 8/31.  she needs her liver function tested due to starting her fenofibrate.  this is standard practice but i can't speak to whether it will be covered by insurance. Follow-up by: Annye Asa MD,  November 21, 2009 12:54 PM  Additional Follow-up for Phone Call Additional follow up Details #1::        spoke w/ patient husband informed of instructions.......Marland KitchenMalachi Bonds CMA  November 22, 2009 1:32 PM

## 2010-05-25 NOTE — Progress Notes (Signed)
Summary: Update  Phone Note Call from Patient Call back at Home Phone [phone]   Summary of Call: Patient called with update stating that she did repeat lft's and Dr. Loanne Drilling told her that doing weight watchers is fine, but if her labs continue she would have to begin Actos.   Initial call taken by: Ernestene Mention CMA,  December 30, 2009 2:35 PM  Follow-up for Phone Call        noted.  pt's LFTs are improving.  can continue fenofibrate at this time. Follow-up by: Annye Asa MD,  December 30, 2009 9:02 PM

## 2010-05-25 NOTE — Letter (Signed)
Summary: Results Follow up Letter  [facility]   [place], [place] [address]   Phone: [phone]  Fax: [phone]    05/10/2009 MRN: [id number]  Breanna Webster [place], [place]  [address]  Dear [name],  The following are the results of your recent test(s):  Test         Result    Pap Smear:        Normal _____  Not Normal _____ Comments: ______________________________________________________ Cholesterol: LDL(Bad cholesterol):         Your goal is less than:         HDL (Good cholesterol):       Your goal is more than: Comments:  ______________________________________________________ Mammogram:        Normal _____  Not Normal _____ Comments:  ___________________________________________________________________ Hemoccult:        Normal _____  Not normal _______ Comments:    _____________________________________________________________________ Other Tests: Please see additional labs done on 05/06/09 and stool culture append.    We routinely do not discuss normal results over the telephone.  If you desire a copy of the results, or you have any questions about this information we can discuss them at your next office visit.   Sincerely,

## 2010-05-25 NOTE — Assessment & Plan Note (Signed)
Summary: 3 MONTH OV///PH   Vital Signs:  Patient profile:   65 year old female Weight:      193.0 pounds BMI:     31.26 Temp:     99.2 degrees F oral Pulse rate:   64 / minute Resp:     17 per minute BP sitting:   118 / 76  (right arm) Cuff size:   regular  Vitals Entered By: Georgette Dover (March 10, 2009 9:15 AM) CC: 1.) 3 month follow-up, Refill all meds for mail order 2.) Ongoing Facial pressure, cough, and congestion, URI symptoms Comments REVIEWED MED LIST, PATIENT AGREED DOSE AND INSTRUCTION CORRECT    CC:  1.) 3 month follow-up, Refill all meds for mail order 2.) Ongoing Facial pressure, cough, and congestion, and URI symptoms.  History of Present Illness:  URI Symptoms      This is a 65 year old woman who presents with F/U of  URI symptoms & med refills. Now major symptoms are retroorbital pain, facial pain & frontal headache despite resolution of purulence with Rxed antibiotics. The patient reports clear nasal discharge, productive cough with clear sputum, and sick contacts(husband), but denies nasal congestion, purulent nasal discharge, sore throat, dry cough, and earache.  Associated symptoms include fever, low-grade fever (<100.5 degrees), and use of an antipyretic.  The patient denies stiff neck, dyspnea, wheezing, rash, vomiting, diarrhea, and response to antipyretic.  The patient also reports sneezing, headache, and severe fatigue.  The patient denies itchy watery eyes, itchy throat, and muscle aches.  The patient denies the following risk factors for Strep sinusitis: tooth pain and tender adenopathy.                                                                                                         FBS not checked. Weight down 6#. Occa hypoglycemia if meal delayed > 4 hrs. Eye exam in 12/10; no retinopathy last year. Labs from [facility] 09/08/2008 reviewed. Vit D 24, not on supplement.  Allergies (verified): No Known Drug Allergies  Review of Systems Eyes:  Denies  blurring, discharge, double vision, red eye, and vision loss-both eyes. CV:  Denies chest pain or discomfort, lightheadness, and near fainting. Resp:  Denies chest pain with inspiration and coughing up blood. Derm:  Denies poor wound healing. Neuro:  Denies numbness and tingling. Endo:  Complains of heat intolerance; denies cold intolerance, excessive hunger, excessive thirst, and excessive urination.  Physical Exam  General:  well-nourished,in no acute distress; alert,appropriate and cooperative throughout examination Eyes:  No corneal or conjunctival inflammation noted. EOMI. Perrla. Vision grossly normal. Ears:  External ear exam shows no significant lesions or deformities.  Otoscopic examination reveals clear canals, tympanic membranes are intact bilaterally without bulging, retraction, inflammation or discharge. Hearing is grossly normal bilaterally. Nose:  External nasal examination shows no deformity or inflammation. Nasal mucosa are dry without lesions or exudates. Mouth:  Oral mucosa and oropharynx without lesions or exudates.  Teeth in good repair. Lungs:  Normal respiratory effort, chest expands symmetrically. Lungs are clear to auscultation, no crackles  or wheezes. Heart:  normal rate, regular rhythm, no gallop, no rub, no JVD, no HJR, and grade 1 /6 systolic murmur ? with R carotid radiation.   Abdomen:  Bowel sounds positive,abdomen soft and non-tender without masses, organomegaly or hernias noted. Pulses:  R and L carotid,radial,dorsalis pedis and posterior tibial pulses are full and equal bilaterally Extremities:  No clubbing, cyanosis, edema. Neurologic:  alert & oriented X3 and sensation intact to light touch.   Skin:  Intact without suspicious lesions or rashes Cervical Nodes:  No lymphadenopathy noted Axillary Nodes:  No palpable lymphadenopathy Psych:  memory intact for recent and remote, normally interactive, and good eye contact.     Impression & Recommendations:   Problem # 1:  OTHER ACUTE SINUSITIS (ICD-461.8)  Resolving but residual headache present Her updated medication list for this problem includes:    Augmentin 500-125 Mg Tabs (Amoxicillin-pot clavulanate) ..... One tablet by mouth two times a day x 10 days    Nasacort Aq 55 Mcg/act Aers (Triamcinolone acetonide(nasal)) .Marland Kitchen... 1 spray two times a day  Orders: Venipuncture ([address]) TLB-CBC Platelet - w/Differential (85025-CBCD)  Problem # 2:  ACUTE BRONCHITIS (ICD-466.0)  Her updated medication list for this problem includes:    Augmentin 500-125 Mg Tabs (Amoxicillin-pot clavulanate) ..... One tablet by mouth two times a day x 10 days  Orders: Venipuncture (60454) TLB-CBC Platelet - w/Differential (85025-CBCD)  Problem # 3:  DIABETES-TYPE 2 (ICD-250.00)  Her updated medication list for this problem includes:    Metformin Hcl 500 Mg Tabs (Metformin hcl) .Marland Kitchen... 1 by mouth am, 2 by mouth pm    Benicar 20 Mg Tabs (Olmesartan medoxomil) .Marland Kitchen... 1 by mouth once daily    Glimepiride 1 Mg Tabs (Glimepiride) .Marland Kitchen... 1 by mouth once daily    Bayer Low Strength 81 Mg Tbec (Aspirin) .Marland Kitchen... 1 by mouth once daily  Orders: Venipuncture (09811) TLB-Creatinine, Blood (82565-CREA) TLB-Potassium (K+) (84132-K) TLB-BUN (Urea Nitrogen) (84520-BUN) TLB-A1C / Hgb A1C (Glycohemoglobin) (83036-A1C) TLB-Microalbumin/Creat Ratio, Urine (82043-MALB)  Problem # 4:  HYPOTHYROIDISM (ICD-244.9)  Her updated medication list for this problem includes:    Synthroid 150 Mcg Tabs (Levothyroxine sodium) .Marland Kitchen... 0.56m once daily  Orders: Venipuncture ((91478 TLB-TSH (Thyroid Stimulating Hormone) (84443-TSH)  Problem # 5:  HYPERLIPIDEMIA (ICD-272.4)  Her updated medication list for this problem includes:    Lofibra 200 Mg Caps (Fenofibrate micronized) ..Marland Kitchen.. 1 by mouth once daily  Orders: Venipuncture ((29562 TLB-Lipid Panel (80061-LIPID) TLB-ALT (SGPT) (84460-ALT) TLB-AST (SGOT) (84450-SGOT)  Problem # 6:   VITAMIN D DEFICIENCY (ICD-268.9)  Orders: Venipuncture ((13086 T-Vitamin D (25-Hydroxy) ((57846-96295  Complete Medication List: 1)  Metformin Hcl 500 Mg Tabs (Metformin hcl) ..Marland Kitchen. 1 by mouth am, 2 by mouth pm 2)  Atenolol 25 Mg Tabs (Atenolol) ..Marland Kitchen. 1 by mouth once daily 3)  Synthroid 150 Mcg Tabs (Levothyroxine sodium) .... 0.158monce daily 4)  Nortriptyline Hcl 25 Mg Caps (Nortriptyline hcl) ...Marland Kitchen 1 by mouth once daily 5)  Lofibra 200 Mg Caps (Fenofibrate micronized) ...Marland Kitchen 1 by mouth once daily 6)  Lexapro 20 Mg Tabs (Escitalopram oxalate) ...Marland Kitchen 1 1/2 by mouth once daily 7)  Benicar 20 Mg Tabs (Olmesartan medoxomil) ...Marland Kitchen 1 by mouth once daily 8)  Glimepiride 1 Mg Tabs (Glimepiride) ...Marland Kitchen 1 by mouth once daily 9)  Bayer Low Strength 81 Mg Tbec (Aspirin) ...Marland Kitchen 1 by mouth once daily 10)  Multivitamins Tabs (Multiple vitamin) ...Marland Kitchen 1 by mouth once daily 11)  Folic Acid 80284cg Tabs (Folic acid) ...Marland Kitchen 1 by  mouth once daily 12)  Vitamin C 500 Mg Tabs (Ascorbic acid) .Marland Kitchen.. 1 by mouth once daily 13)  Vitamin B-12 1000 Mcg Tabs (Cyanocobalamin) .Marland Kitchen.. 1 by mouth once daily 14)  Fortical 200 Unit/act Soln (Calcitonin (salmon)) .Marland Kitchen.. 1 spray in nostril (alternate nostrils) daily 15)  Augmentin 500-125 Mg Tabs (Amoxicillin-pot clavulanate) .... One tablet by mouth two times a day x 10 days 16)  Nasacort Aq 55 Mcg/act Aers (Triamcinolone acetonide(nasal)) .Marland Kitchen.. 1 spray two times a day 17)  Propoxyphene N-apap 100-650 Mg Tabs (Propoxyphene n-apap) .Marland Kitchen.. 1 q 4-6 hrs as needed headache  Patient Instructions: 1)  Drink as much fluid as you can tolerate for the next few days. Nasal spray two times a day until headache gone; complete all antibiotics Prescriptions: PROPOXYPHENE N-APAP 100-650 MG TABS (PROPOXYPHENE N-APAP) 1 q 4-6 hrs as needed headache  #30 x 0   Entered and Authorized by:   Unice Cobble MD   Signed by:   Unice Cobble MD on 03/10/2009   Method used:   Print then Give to Patient   RxID:    [id number] NASACORT AQ 55 MCG/ACT AERS (TRIAMCINOLONE ACETONIDE(NASAL)) 1 spray two times a day  #1 x 5   Entered and Authorized by:   Unice Cobble MD   Signed by:   Unice Cobble MD on 03/10/2009   Method used:   Print then Give to Patient   RxID:   [id number]

## 2010-05-25 NOTE — Letter (Signed)
Summary: Results Follow up Letter  [facility]   [place], [place] [address]   Phone: [phone]  Fax: [phone]    05/09/2009 MRN: [id number]  Breanna Webster [place], [place]  [address]  Dear [name],  The following are the results of your recent test(s):  Test         Result    Pap Smear:        Normal _____  Not Normal _____ Comments: ______________________________________________________ Cholesterol: LDL(Bad cholesterol):         Your goal is less than:         HDL (Good cholesterol):       Your goal is more than: Comments:  ______________________________________________________ Mammogram:        Normal _____  Not Normal _____ Comments:  ___________________________________________________________________ Hemoccult:        Normal _____  Not normal _______ Comments:    _____________________________________________________________________ Other Tests: Please see attached labs done on 05/06/2009    We routinely do not discuss normal results over the telephone.  If you desire a copy of the results, or you have any questions about this information we can discuss them at your next office visit.   Sincerely,

## 2010-05-25 NOTE — Assessment & Plan Note (Signed)
Summary: fell injury knee//fd   Vital Signs:  Patient profile:   65 year old female Weight:      188 pounds BMI:     31.16 Pulse rate:   77 / minute BP sitting:   122 / 68  (left arm)  Vitals Entered By: Malachi Bonds CMA (April 14, 2010 9:10 AM) CC: R knee painful after fall x1 wk ago   History of Present Illness: 65 yo woman here today for R knee pain.  fell leaving a store last week.  reports she had extensive bruising and swelling last week.  no longer swelling but by mid day will have so much pain she is unable to touch it.  pain worse w/ bending the knee.  took ibuprofen for 3-4 days but not currently.  pt fears fracturing knee cap  Current Medications (verified): 1)  Atenolol 25 Mg Tabs (Atenolol) .Marland Kitchen.. 1 By Mouth Once Daily 2)  Synthroid 150 Mcg Tabs (Levothyroxine Sodium) .... 0.77m Once Daily 3)  Nortriptyline Hcl 25 Mg Caps (Nortriptyline Hcl) ..Marland Kitchen. 1 By Mouth Once Daily 4)  Lexapro 20 Mg Tabs (Escitalopram Oxalate) ..Marland Kitchen. 1 1/2 By Mouth Once Daily 5)  Benicar 20 Mg Tabs (Olmesartan Medoxomil) ..Marland Kitchen. 1 By Mouth Once Daily 6)  Bayer Low Strength 81 Mg Tbec (Aspirin) ..Marland Kitchen. 1 By Mouth Once Daily 7)  Multivitamins  Tabs (Multiple Vitamin) ..Marland Kitchen. 1 By Mouth Once Daily 8)  Folic Acid 8161Mcg Tabs (Folic Acid) ..Marland Kitchen. 1 By Mouth Once Daily 9)  Vitamin C 500 Mg Tabs (Ascorbic Acid) ..Marland Kitchen. 1 By Mouth Once Daily 10)  Vitamin B-12 1000 Mcg Tabs (Cyanocobalamin) ..Marland Kitchen. 1 By Mouth Once Daily 11)  Vitamin D3 5000 Units Tabs (Cholecalciferol) ..Marland Kitchen. 1 Once Daily 12)  Fish Oil  Oil (Fish Oil) .... Take One By Mouth Once Daily 13)  Fenofibrate Micronized 200 Mg Caps (Fenofibrate Micronized) .... Take One Tablet Daily 14)  Janumet 50-1000 Mg Tabs (Sitagliptin-Metformin Hcl) ..Marland Kitchen. 1 Tab By Mouth Two Times A Day 15)  Accu-Chek Comfort Curve  Strp (Glucose Blood) .... Use As Directed 16)  Vitamin D (Ergocalciferol) 50000 Unit Caps (Ergocalciferol) .... Take 1 Tab Weekly For 12 Weeks Then Recheck  Level  Allergies (verified): No Known Drug Allergies  Review of Systems      See HPI  Physical Exam  General:  well-nourished,in no acute distress; alert,appropriate and cooperative throughout examination Pulses:  +2 DP/PT Extremities:  edema of R knee, no erythema or bruising minimal pain w/ flexion and extension pain w/ palpation of patella   Impression & Recommendations:  Problem # 1:  KNEE PAIN (ICD-719.46) Assessment Deteriorated pt w/ obvious swelling of R knee.  get xrays to assess for patella fx.  start NSAIDs, ice.  reviewed supportive care and red flags that should prompt return.  Pt expresses understanding and is in agreement w/ this plan. Her updated medication list for this problem includes:    Bayer Low Strength 81 Mg Tbec (Aspirin) ..Marland Kitchen.. 1 by mouth once daily    Naprosyn 500 Mg Tabs (Naproxen) ..Marland Kitchen.. 1 two times a day x7-10 days and then as needed.  take w/ food.  Orders: T-Knee Comp Right 4 Views ([id number] Prescription Created Electronically ([phone]  Complete Medication List: 1)  Atenolol 25 Mg Tabs (Atenolol) ..Marland Kitchen. 1 by mouth once daily 2)  Synthroid 150 Mcg Tabs (Levothyroxine sodium) .... 0.181monce daily 3)  Nortriptyline Hcl 25 Mg Caps (Nortriptyline hcl) ...Marland Kitchen 1 by mouth once daily 4)  Lexapro  20 Mg Tabs (Escitalopram oxalate) .Marland Kitchen.. 1 1/2 by mouth once daily 5)  Benicar 20 Mg Tabs (Olmesartan medoxomil) .Marland Kitchen.. 1 by mouth once daily 6)  Bayer Low Strength 81 Mg Tbec (Aspirin) .Marland Kitchen.. 1 by mouth once daily 7)  Multivitamins Tabs (Multiple vitamin) .Marland Kitchen.. 1 by mouth once daily 8)  Folic Acid 782 Mcg Tabs (Folic acid) .Marland Kitchen.. 1 by mouth once daily 9)  Vitamin C 500 Mg Tabs (Ascorbic acid) .Marland Kitchen.. 1 by mouth once daily 10)  Vitamin B-12 1000 Mcg Tabs (Cyanocobalamin) .Marland Kitchen.. 1 by mouth once daily 11)  Vitamin D3 5000 Units Tabs (cholecalciferol)  .Marland Kitchen.. 1 once daily 12)  Fish Oil Oil (Fish oil) .... Take one by mouth once daily 13)  Fenofibrate Micronized 200 Mg Caps  (Fenofibrate micronized) .... Take one tablet daily 14)  Janumet 50-1000 Mg Tabs (Sitagliptin-metformin hcl) .Marland Kitchen.. 1 tab by mouth two times a day 15)  Accu-chek Comfort Curve Strp (Glucose blood) .... Use as directed 16)  Vitamin D (ergocalciferol) 50000 Unit Caps (Ergocalciferol) .... Take 1 tab weekly for 12 weeks then recheck level 17)  Naprosyn 500 Mg Tabs (Naproxen) .Marland Kitchen.. 1 two times a day x7-10 days and then as needed.  take w/ food.  Patient Instructions: 1)  Go to the [facility] to get your xray 2)  We'll notify you of your results 3)  Take the Naprosyn as directed- take w/ food to avoid upset stomach 4)  Ice the knee 5)  Hang in there! Prescriptions: NORTRIPTYLINE HCL 25 MG CAPS (NORTRIPTYLINE HCL) 1 by mouth once daily  #90 Capsule x 0   Entered and Authorized by:   Annye Asa MD   Signed by:   Annye Asa MD on 04/14/2010   Method used:   Electronically to        [facility]. [phone]* (retail)       [place], [place]  [address]       Ph: [phone]       Fax: [phone]   RxID:   [id number] JANUMET 50-1000 MG TABS (SITAGLIPTIN-METFORMIN HCL) 1 tab by mouth two times a day  #180 x 1   Entered and Authorized by:   Annye Asa MD   Signed by:   Annye Asa MD on 04/14/2010   Method used:   Electronically to        [facility]. [phone]* (retail)       [place], [place]  [address]       Ph: [phone]       Fax: [phone]   RxID:   [id number] FENOFIBRATE MICRONIZED 200 MG CAPS (FENOFIBRATE MICRONIZED) take one tablet daily  #90 x 3   Entered and Authorized by:   Annye Asa MD   Signed by:   Annye Asa MD on 04/14/2010   Method used:   Electronically to        [facility]. [phone]* (retail)       [place], [place]  [address]       Ph: [phone]       Fax: [phone]    RxID:   [id number] SYNTHROID 150 MCG TABS (LEVOTHYROXINE SODIUM) 0.15MG once daily  #90 Tablet  x 0   Entered and Authorized by:   Annye Asa MD   Signed by:   Annye Asa MD on 04/14/2010   Method used:   Electronically to        [facility]. [phone]* (retail)       [place], [place]  [address]       Ph: [phone]       Fax: [phone]   RxID:   [id number] NAPROSYN 500 MG TABS (NAPROXEN) 1 two times a day x7-10 days and then as needed.  take w/ food.  #60 x 0   Entered and Authorized by:   Annye Asa MD   Signed by:   Annye Asa MD on 04/14/2010   Method used:   Electronically to        [facility]. [phone]* (retail)       [place], [place]  [address]       Ph: [phone]       Fax: [phone]   RxID:   [id number]    Orders Added: 1)  T-Knee Comp Right 4 Views [[id number]] 2)  Est. Patient Level III [98[address]] 3)  Prescription Created Electronically [phone]

## 2010-05-25 NOTE — Progress Notes (Signed)
Summary: question about Vit D pills  Phone Note Call from Patient Call back at Home Phone [phone]   Caller: Patient Summary of Call: patient called to ask question about her Vit D 50,000 pills once a week----has finished 4 pills---does she need a refill or does she need a blood test??      Uses [facility] on [address], [place], [place]  ---please call her at [phone] Initial call taken by: Berneta Sages,  May 16, 2010 12:46 PM  Follow-up for Phone Call        Left Pt detail message she is to continue Vit D weekly x12 weeks and then recheck level. Pt also advise that there are refills on file at pharmacy for med, Pt to contact pharmacy and our office to schedule lab appt.......Marland KitchenFelecia Deloach CMA  May 16, 2010 12:56 PM

## 2010-05-25 NOTE — Letter (Signed)
Summary: New Patient Letter  [facility]   [place], [place] [address]   Phone: [phone]  Fax: [phone]       11/23/2008 MRN: [id number]  Breanna Webster [address] [place], [place]  [address]  Dear [name],   Welcome to [facility] and thank you for choosing [name] as your Primary Care Providers. Enclosed you will find information about our practice that we hope you find helpful. We have also enclosed forms to be filled out prior to your visit. This will provide [name] with the necessary information and facilitate your being seen in a timely manner. If you have any questions, please call us at:[phone] and we will be happy to assist you. We look forward to seeing you at your scheduled appointment time.  Appointment   12-13-08 @ 10:30    with Dr.    Annye Asa     Sincerely,  [place] [facility]  Please arrive 15 minutes early for your first appointment and bring your insurance card. Co-pay is required at the time of your visit.  *****Please call the office if you are not able to keep this appointment. There is a charge of $50.00 if any appointment is not cancelled or rescheduled within 24 hours.

## 2010-05-25 NOTE — Letter (Signed)
Summary: [facility]  [facility]   Imported By: Edmonia James 01/12/2009 10:41:24  _____________________________________________________________________  External Attachment:    Type:   Image     Comment:   External Document

## 2010-05-25 NOTE — Procedures (Signed)
Summary: [facility]oscopy[facility]  [facility]   Imported By: Edmonia James 12/22/2008 09:31:14  _____________________________________________________________________  External Attachment:    Type:   Image     Comment:   External Document

## 2010-05-25 NOTE — Progress Notes (Signed)
Summary: labs   Phone Note Outgoing Call   Call placed by: Malachi Bonds CMA,  December 29, 2009 10:49 AM Call placed to: Patient Summary of Call: A1C improved dramatically- 7.8 --> 6.7  glucose isn't fasting  AST/ALT elevated.  should hold off on tylenol/ETOH for 2 weeks and recheck LFTs.  if still elevated may need to hold fenofibrate.  will follow closely.   Follow-up for Phone Call        spoke w/ husband informed of information ....Marland KitchenMarland KitchenMalachi Bonds CMA  December 29, 2009 10:49 AM

## 2010-05-25 NOTE — Assessment & Plan Note (Signed)
Summary: diarrhea/ibs...as.   History of Present Illness Visit Type: Initial Consult Primary GI MD: Erskine Emery MD [facility] Primary Provider: Unice Cobble, MD Requesting Provider: Unice Cobble, MD Chief Complaint: Patient complains of IBS and she did have diarrhea for 2 months which she took Align for. She states that her diarrhea is resolved now but she woul djust like a dx and to make sure nothing eles is going on with her GI wise to have cuased the diarrhea. She does complain of alot of gas and bloating as well.  History of Present Illness:   Breanna Webster  is a 65 year old white female referred at the request of Dr. Linna Darner for evaluation of diarrhea.  For the past 2 months she has been complaining of frequent diarrhea consisting of 3-4 loose bowel movements a day accompanied by urgency.  She took antibiotics for a sinus infection though she claims that her symptoms preceded is was no history of melena or hematochezia.  Discontinue while she was on antibiotics.  Which are and C. difficile toxin were negative.  The overlying-free diet her symptoms have entirely subsided.  She is now having a normal solid stool daily.  She has a history of lactose-intolerance.  She thinks that she had a colonoscopy within the past 5 years in [place].   GI Review of Systems    Reports belching and  bloating.      Denies abdominal pain, acid reflux, chest pain, dysphagia with liquids, dysphagia with solids, heartburn, loss of appetite, nausea, vomiting, vomiting blood, weight loss, and  weight gain.      Reports diarrhea, irritable bowel syndrome, and  rectal pain.     Denies anal fissure, black tarry stools, change in bowel habit, constipation, diverticulosis, fecal incontinence, heme positive stool, hemorrhoids, jaundice, light color stool, liver problems, and  rectal bleeding. Preventive Screening-Counseling & Management  Alcohol-Tobacco     Smoking Status: never      Drug Use:  no.      Current  Medications (verified): 1)  Metformin Hcl 500 Mg Tabs (Metformin Hcl) .Marland Kitchen.. 1 By Mouth Am, 2 By Mouth Pm 2)  Atenolol 25 Mg Tabs (Atenolol) .Marland Kitchen.. 1 By Mouth Once Daily 3)  Synthroid 150 Mcg Tabs (Levothyroxine Sodium) .... 0.64m Once Daily 4)  Nortriptyline Hcl 25 Mg Caps (Nortriptyline Hcl) ..Marland Kitchen. 1 By Mouth Once Daily 5)  Lexapro 20 Mg Tabs (Escitalopram Oxalate) ..Marland Kitchen. 1 1/2 By Mouth Once Daily 6)  Benicar 20 Mg Tabs (Olmesartan Medoxomil) ..Marland Kitchen. 1 By Mouth Once Daily 7)  Bayer Low Strength 81 Mg Tbec (Aspirin) ..Marland Kitchen. 1 By Mouth Once Daily 8)  Multivitamins  Tabs (Multiple Vitamin) ..Marland Kitchen. 1 By Mouth Once Daily 9)  Folic Acid 8989Mcg Tabs (Folic Acid) ..Marland Kitchen. 1 By Mouth Once Daily 10)  Vitamin C 500 Mg Tabs (Ascorbic Acid) ..Marland Kitchen. 1 By Mouth Once Daily 11)  Vitamin B-12 1000 Mcg Tabs (Cyanocobalamin) ..Marland Kitchen. 1 By Mouth Once Daily 12)  Fortical 200 Unit/act Soln (Calcitonin (Salmon)) ..Marland Kitchen. 1 Spray in Nostril (Alternate Nostrils) Daily 13)  Vitamin D3 5000 Units Tabs (Cholecalciferol) ..Marland Kitchen. 1 Once Daily 14)  Fish Oil  Oil (Fish Oil) .... Take One By Mouth Once Daily  Allergies (verified): No Known Drug Allergies  Past History:  Past Medical History: DM GERD Graves Disease  Past Surgical History: hysteroscopy/laparoscopy for fibroids Cholecystectomy Hiatal hernia surgery  Family History: Reviewed history from 12/13/2008 and no changes required. ADOPTED   CANCER, REFLUX,DM, HEART IN BIRTH FAM (? 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  Alcohol Use - no Daily Caffeine Use one per day Illicit Drug Use - no Drug Use:  no  Review of Systems       The patient complains of allergy/sinus, heart murmur, and sore throat.  The patient denies anemia, anxiety-new, arthritis/joint pain, back pain, blood in urine, breast changes/lumps, change in vision,  confusion, cough, coughing up blood, depression-new, fainting, fatigue, fever, headaches-new, hearing problems, heart rhythm changes, itching, menstrual pain, muscle pains/cramps, night sweats, nosebleeds, pregnancy symptoms, shortness of breath, skin rash, sleeping problems, swelling of feet/legs, swollen lymph glands, thirst - excessive , urination - excessive , urination changes/pain, urine leakage, vision changes, and voice change.         All other systems were reviewed and were negative   Vital Signs:  Patient profile:   66 year old female Height:      66 inches Weight:      193.4 pounds BMI:     31.33 Pulse rate:   76 / minute Pulse rhythm:   regular BP sitting:   142 / 78  (left arm) Cuff size:   regular  Vitals Entered By: [name] CMA Deborra Medina) (June 01, 2009 8:43 AM)  Physical Exam  Additional Exam:  She is a heavyset female  skin: anicteric HEENT: normocephalic; PEERLA; no nasal or pharyngeal abnormalities neck: supple nodes: no cervical lymphadenopathy chest: clear to ausculatation and percussion heart: no murmurs, gallops, or rubs abd: soft, nontender; BS normoactive; no abdominal masses, tenderness, organomegaly rectal: deferred ext: no cynanosis, clubbing, edema skeletal: no deformities neuro: oriented x 3; no focal abnormalities    Impression & Recommendations:  Problem # 1:  IBS (ICD-564.1) Her diarrhea has subsided.  I suspect this was related to antibiotic use and perhaps an  antecedent gastroenteritis.  At any rate it is no longer an active problem.  She likely is lactose-intolerant.  Recommendations #1 no further GI workup at this time #2 lactase supplementation

## 2010-05-25 NOTE — Letter (Signed)
Summary: [facility]  [facility]   Imported By: Edmonia James 02/10/2010 08:10:23  _____________________________________________________________________  External Attachment:    Type:   Image     Comment:   External Document

## 2010-05-25 NOTE — Letter (Signed)
Summary: Primary Care Consult Scheduled Letter  [facility]   [place], [place] [address]   Phone: [phone]  Fax: [phone]      04/13/2009 MRN: [id number]  Breanna Webster [place], [place]  [address]    Dear [name],    We have scheduled an appointment for you.  At the recommendation of Dr. Unice Cobble, we have scheduled you a consult with Rande Lawman for an ECHO on 04-28-2009 at 4:00pm.  Their address is [address] [place], [place] [address]. The office phone number is [phone].  If this appointment day and time is not convenient for you, please feel free to call the office of the doctor you are being referred to at the number listed above and reschedule the appointment.    It is important for you to keep your scheduled appointments. We are here to make sure you are given good patient care.   Thank you,    Renee, Patient Care Coordinator [place] [facility]

## 2010-05-25 NOTE — Progress Notes (Signed)
Summary: WANTS "ECHO" AS SOON AS POSSIBLE  Phone Note Call from Patient Call back at [phone]   Caller: Patient Summary of Call: PATIENT SAYS SHE JUST GOT LETTER FROM DR HOPPER STATING THAT SHE HAS AN ENLARGED HEART--SHE WOULD LIKE TO GET AN "ECHO" SCHEDULED FOR THIS AFTERNOON BECAUSE SHE IS GOING OUT OF [facility] CALL HER AT [phone] Initial call taken by: Berneta Sages,  April 11, 2009 12:44 PM  Follow-up for Phone Call        left message to call  office to get further information on this matter.............Marland KitchenFelecia Deloach CMA  April 11, 2009 12:57 PM  left message to call  office.....................Marland KitchenFelecia Deloach CMA  April 11, 2009 3:45 PM  left message to call  office................Marland KitchenFelecia Deloach CMA  April 12, 2009 8:26 AM   Additional Follow-up for Phone Call Additional follow up Details #1::        the ECHO could not be done today unless it were an emergency. Luckily she has no emergent symptoms; ECHO can safely be scheduled the week she returns. Additional Follow-up by: Unice Cobble MD,  April 11, 2009 3:37 PM  New Problems: CARDIOMEGALY (ICD-429.3)   Additional Follow-up for Phone Call Additional follow up Details #2::    pt aware referral put in awaiting appt info.........................Marland KitchenFelecia Deloach CMA  April 12, 2009 9:05 AM   New Problems: CARDIOMEGALY (ICD-429.3)

## 2010-05-25 NOTE — Progress Notes (Signed)
Summary: Test strip refill  Phone Note Refill Request      New/Updated Medications: ACCU-CHEK COMFORT CURVE  STRP (GLUCOSE BLOOD) use as directed Prescriptions: ACCU-CHEK COMFORT CURVE  STRP (GLUCOSE BLOOD) use as directed  #100 x 3   Entered by:   Ernestene Mention CMA   Authorized by:   Annye Asa MD   Signed by:   Ernestene Mention CMA on 12/05/2009   Method used:   Electronically to        [facility]. [phone]* (retail)       [address]       [place], [place]  [address]       Ph: [phone]       Fax: [phone]   RxID:   [id number]

## 2010-05-25 NOTE — Progress Notes (Signed)
Summary: Phone-  Phone Note Call from Patient Call back at Home Phone [phone] Call back at Work Phone [phone]   Caller: Patient Summary of Call: patient states in october she had a very bad metaloic  taste in her mouth. Patient is wondering if that had anything to do with mole ands asbestos in her house. Patient was seen today. Initial call taken by: Silva Bandy,  March 21, 2009 12:16 PM  Follow-up for Phone Call        per dr Ezrah Dembeck we need to see cxray to see more likely related to subclinical reflux. npo for 3 hour pre bedtime..............Marland KitchenFelecia Deloach CMA  March 21, 2009 5:10 PM     Additional Follow-up for Phone Call Additional follow up Details #1::        pt aware however pt states that she has not had that taste for over a month but informed if it return to try the following pt ok....................................Marland KitchenFelecia Deloach CMA  March 21, 2009 5:14 PM

## 2010-05-25 NOTE — Assessment & Plan Note (Signed)
Summary: nasal drainage,facial pain sunuse/alr   Vital Signs:  Patient profile:   65 year old female Weight:      192.6 pounds O2 Sat:      93 % on Room air Temp:     99.4 degrees F oral Pulse rate:   64 / minute Pulse rhythm:   regular Resp:     14 per minute BP sitting:   164 / 94  (right arm) Cuff size:   regular  Vitals Entered By: Thomasena Edis (March 07, 2009 9:42 AM)  O2 Flow:  Room air CC: c/o thick green phlegm for 2 weeks, headache, runny nose   CC:  c/o thick green phlegm for 2 weeks, headache, and runny nose.  History of Present Illness: herpes type ulcer on lip onset sunday night.  Notes low grade fever 99.4.  Notes BP at home has been labile.  Notes mile cough.  Thick mucous/ green x 10 days.  Notes frontal sinus pressure and behind eyes.  patient stated was diabetic- sugars about 137 in AM pt states bp changes a lot but is high when sick  Allergies (verified): No Known Drug Allergies  Review of Systems       Denies fever, + green  nasal d/c and green sputum.    Physical Exam  General:  Well-developed,well-nourished,in no acute distress; alert,appropriate and cooperative throughout examination Ears:  External ear exam shows no significant lesions or deformities.  Otoscopic examination reveals clear canals, tympanic membranes are intact bilaterally without bulging, retraction, inflammation or discharge. Hearing is grossly normal bilaterally. Mouth:  Oral mucosa and oropharynx without lesions or exudates.  Teeth in good repair. Neck:  No deformities, masses, or tenderness noted. Lungs:  Normal respiratory effort, chest expands symmetrically. Lungs are clear to auscultation, no crackles or wheezes. Heart:  Normal rate and regular rhythm. S1 and S2 normal without gallop, murmur, click, rub or other extra sounds.   Impression & Recommendations:  Problem # 1:  ACUTE BRONCHITIS (ICD-466.0)  Her updated medication list for this problem includes:    Augmentin  500-125 Mg Tabs (Amoxicillin-pot clavulanate) ..... One tablet by mouth two times a day x 10 days  Problem # 2:  OTHER ACUTE SINUSITIS (ICD-461.8)  Her updated medication list for this problem includes:    Augmentin 500-125 Mg Tabs (Amoxicillin-pot clavulanate) ..... One tablet by mouth two times a day x 10 days  Complete Medication List: 1)  Metformin Hcl 500 Mg Tabs (Metformin hcl) .Marland Kitchen.. 1 by mouth am, 2 by mouth pm 2)  Atenolol 25 Mg Tabs (Atenolol) .Marland Kitchen.. 1 by mouth once daily 3)  Synthroid 150 Mcg Tabs (Levothyroxine sodium) .... 0.63m once daily 4)  Nortriptyline Hcl 25 Mg Caps (Nortriptyline hcl) ..Marland Kitchen. 1 by mouth once daily 5)  Lofibra 200 Mg Caps (Fenofibrate micronized) ..Marland Kitchen. 1 by mouth once daily 6)  Lexapro 20 Mg Tabs (Escitalopram oxalate) ..Marland Kitchen. 1 1/2 by mouth once daily 7)  Benicar 20 Mg Tabs (Olmesartan medoxomil) ..Marland Kitchen. 1 by mouth once daily 8)  Glimepiride 1 Mg Tabs (Glimepiride) ..Marland Kitchen. 1 by mouth once daily 9)  Bayer Low Strength 81 Mg Tbec (Aspirin) ..Marland Kitchen. 1 by mouth once daily 10)  Multivitamins Tabs (Multiple vitamin) ..Marland Kitchen. 1 by mouth once daily 11)  Folic Acid 8053Mcg Tabs (Folic acid) ..Marland Kitchen. 1 by mouth once daily 12)  Vitamin C 500 Mg Tabs (Ascorbic acid) ..Marland Kitchen. 1 by mouth once daily 13)  Vitamin B-12 1000 Mcg Tabs (Cyanocobalamin) ..Marland Kitchen. 1 by mouth once  daily 14)  Fortical 200 Unit/act Soln (Calcitonin (salmon)) .Marland Kitchen.. 1 spray in nostril (alternate nostrils) daily 15)  Augmentin 500-125 Mg Tabs (Amoxicillin-pot clavulanate) .... One tablet by mouth two times a day x 10 days  Patient Instructions: 1)  You may use tylenol 650 mg by mouth every 6 hours as needed for pain/fever.  Please call us if your symptoms do not improve in the next 48 hours.  Keep your appointment with Dr. Linna Darner on thursday for your complete physical.   Prescriptions: AUGMENTIN 500-125 MG TABS (AMOXICILLIN-POT CLAVULANATE) one tablet by mouth two times a day x 10 days  #20 x 0   Entered and Authorized by:   Nance Pear FNP   Signed by:   Nance Pear FNP on 03/07/2009   Method used:   Electronically to        [facility]. [phone]* (retail)       [address]       [place], [place]  [address]       Ph: [phone]       Fax: [phone]   RxID:   [id number]   Current Allergies (reviewed today): No known allergies

## 2010-05-25 NOTE — Progress Notes (Signed)
Summary: xray results  Phone Note Outgoing Call   Call placed by: Malachi Bonds CMA,  April 14, 2010 4:48 PM Call placed to: Patient Summary of Call: knee cap is not broken.  has a lot of swelling in the joint.  should take the Naprosyn and ice regularly.   Follow-up for Phone Call        left message on machine ....Marland KitchenMarland KitchenMalachi Bonds CMA  April 14, 2010 4:48 PM   Additional Follow-up for Phone Call Additional follow up Details #1::        Pt aware..........Marland KitchenFelecia Deloach CMA  April 14, 2010 4:58 PM

## 2010-05-25 NOTE — Letter (Signed)
Summary: New Patient letter  [facility]  [place], [place] [address]   Phone: [phone]  Fax: [phone]       05/06/2009 MRN: [id number]  Breanna Webster [place], [place]  [address]  Dear Breanna Webster,  Welcome to the Gastroenterology Division at [facility].    You are scheduled to see Dr.  Deatra Ina on 06/01/2009 at 8:30AM on the 3rd floor at [facility], [place] [employer].  We ask that you try to arrive at our office 15 minutes prior to your appointment time to allow for check-in.  We would like you to complete the enclosed self-administered evaluation form prior to your visit and bring it with you on the day of your appointment.  We will review it with you.  Also, please bring a complete list of all your medications or, if you prefer, bring the medication bottles and we will list them.  Please bring your insurance card so that we may make a copy of it.  If your insurance requires a referral to see a specialist, please bring your referral form from your primary care physician.  Co-payments are due at the time of your visit and may be paid by cash, check or credit card.     Your office visit will consist of a consult with your physician (includes a physical exam), any laboratory testing he/she may order, scheduling of any necessary diagnostic testing (e.g. x-ray, ultrasound, CT-scan), and scheduling of a procedure (e.g. Endoscopy, Colonoscopy) if required.  Please allow enough time on your schedule to allow for any/all of these possibilities.    If you cannot keep your appointment, please call [phone] to cancel or reschedule prior to your appointment date.  This allows [name] the opportunity to schedule an appointment for another patient in need of care.  If you do not cancel or reschedule by 5 p.m. the business day prior to your appointment date, you will be charged a $50.00 late cancellation/no-show fee.    Thank you for choosing  [facility] for your medical needs.  We appreciate the opportunity to care for you.  Please visit [name] at our website  to learn more about our practice.                     Sincerely,                                                             The Gastroenterology Division   Appended Document: Orders Update    Clinical Lists Changes  Orders: Added new Test order of T- * Misc. Laboratory test [id number]) - Signed

## 2010-05-25 NOTE — Assessment & Plan Note (Signed)
Summary: SINUS, EARS HURT, CHEST CONGESTION///SPH   Vital Signs:  Patient profile:   65 year old female Height:      66 inches Weight:      191 pounds O2 Sat:      97 % on Room air Temp:     98.9 degrees F oral Pulse rate:   72 / minute Resp:     16 per minute BP sitting:   130 / 80  (left arm)  Vitals Entered By: Rolla Flatten CMA (April 29, 2009 9:03 AM)  O2 Flow:  Room air CC: chest congestion,cough x3days Comments REVIEWED MED LIST, PATIENT AGREED DOSE AND INSTRUCTION CORRECT    CC:  chest congestion and cough x3days.  History of Present Illness: 2  D ECHO reviewed & risks discussed. FBS not checked; no specific diet ( kitchen being remodeled). RTI as of 01/03/20111 as ST , followed by earache & thick yellow from nose. Rx: Mucinex. After 2D ECHO & RTI addressed she mentioned 5 weeks of loose stoo w/o frank diarrheal. PMH of IBS. She completed  10 days of Augmentin as of 03/31/2009.  Allergies (verified): No Known Drug Allergies  Review of Systems General:  Denies chills, fever, and sweats. ENT:  Complains of sinus pressure; denies ear discharge and nasal congestion; No frontal headache , facial pain but purulence. Resp:  Complains of cough, sputum productive, and wheezing; denies chest pain with inspiration, coughing up blood, and shortness of breath.  Physical Exam  General:  well-nourished,in no acute distress; alert,appropriate and cooperative throughout examination Ears:  External ear exam shows no significant lesions or deformities.  Otoscopic examination reveals clear canals, tympanic membranes are intact bilaterally without bulging, retraction, inflammation or discharge. Hearing is grossly normal bilaterally. Nose:  External nasal examination shows no deformity or inflammation. Nasal mucosa are pink and moist without lesions or exudates. Mouth:  Oral mucosa and oropharynx without lesions or exudates.  Teeth in good repair. Lungs:  Normal respiratory effort,  chest expands symmetrically. Lungs are clear to auscultation, no crackles or wheezes but brassy cough Heart:  normal rate, regular rhythm, no gallop, no rub, no JVD, no HJR, and grade  9.4/7 systolic murmur @ base with neck radiation.   Abdomen:  Bowel sounds positive,abdomen soft and non-tender without masses, organomegaly or hernias noted. Protuberant Pulses:  R and L carotid,radial,dorsalis pedis and posterior tibial pulses are full and equal bilaterally. Carotid rumble as noted  Extremities:  No clubbing, cyanosis, edema. Skin:  Intact without suspicious lesions or rashes Cervical Nodes:  No lymphadenopathy noted Axillary Nodes:  No palpable lymphadenopathy   Impression & Recommendations:  Problem # 1:  VENTRICULAR HYPERTROPHY, LEFT (ICD-429.3) see 2D ECHO  Problem # 2:  SINUSITIS- ACUTE-NOS (ICD-461.9)  The following medications were removed from the medication list:    Augmentin 500-125 Mg Tabs (Amoxicillin-pot clavulanate) ..... One tablet by mouth two times a day x 10 days    Nasacort Aq 55 Mcg/act Aers (Triamcinolone acetonide(nasal)) .Marland Kitchen... 1 spray two times a day Her updated medication list for this problem includes:    Metronidazole 500 Mg Tabs (Metronidazole) .Marland Kitchen... 1 three times a day ; avoid alcohol  Problem # 3:  ACUTE BRONCHITIS (ICD-466.0)  The following medications were removed from the medication list:    Augmentin 500-125 Mg Tabs (Amoxicillin-pot clavulanate) ..... One tablet by mouth two times a day x 10 days Her updated medication list for this problem includes:    Metronidazole 500 Mg Tabs (Metronidazole) .Marland KitchenMarland KitchenMarland KitchenMarland Kitchen 1  three times a day ; avoid alcohol  Problem # 4:  IBS (ICD-564.1)  Complete Medication List: 1)  Metformin Hcl 500 Mg Tabs (Metformin hcl) .Marland Kitchen.. 1 by mouth am, 2 by mouth pm 2)  Atenolol 25 Mg Tabs (Atenolol) .Marland Kitchen.. 1 by mouth once daily 3)  Synthroid 150 Mcg Tabs (Levothyroxine sodium) .... 0.31m once daily 4)  Nortriptyline Hcl 25 Mg Caps  (Nortriptyline hcl) ..Marland Kitchen. 1 by mouth once daily 5)  Lexapro 20 Mg Tabs (Escitalopram oxalate) ..Marland Kitchen. 1 1/2 by mouth once daily 6)  Benicar 20 Mg Tabs (Olmesartan medoxomil) ..Marland Kitchen. 1 by mouth once daily 7)  Bayer Low Strength 81 Mg Tbec (Aspirin) ..Marland Kitchen. 1 by mouth once daily 8)  Multivitamins Tabs (Multiple vitamin) ..Marland Kitchen. 1 by mouth once daily 9)  Folic Acid 8919Mcg Tabs (Folic acid) ..Marland Kitchen. 1 by mouth once daily 10)  Vitamin C 500 Mg Tabs (Ascorbic acid) ..Marland Kitchen. 1 by mouth once daily 11)  Vitamin B-12 1000 Mcg Tabs (Cyanocobalamin) ..Marland Kitchen. 1 by mouth once daily 12)  Fortical 200 Unit/act Soln (Calcitonin (salmon)) ..Marland Kitchen. 1 spray in nostril (alternate nostrils) daily 13)  Tylenol With Codeine #3 300-30 Mg Tabs (Acetaminophen-codeine) .... Take 1-2 q 6hrs as needed 14)  Vitamin D3 5000 Unit/ml Liqd (Cholecalciferol) ..Marland Kitchen. 1 once daily 15)  Vitamin D3 5000 Unit/ml Liqd (Cholecalciferol) ..Marland Kitchen. 1 once daily 16)  Metronidazole 500 Mg Tabs (Metronidazole) ..Marland Kitchen. 1 three times a day ; avoid alcohol  Patient Instructions: 1)  Align once daily until bowels normal. 2)  Please schedule a follow-up Lab  appointment in 2 months. 3)  HbgA1C; vitamin D level (250.00; 268.9) Prescriptions: METRONIDAZOLE 500 MG TABS (METRONIDAZOLE) 1 three times a day ; avoid alcohol  #30 x 0   Entered and Authorized by:   WUnice CobbleMD   Signed by:   WUnice CobbleMD on 04/29/2009   Method used:   Print then Give to Patient   RxID:   [id number]

## 2010-06-23 ENCOUNTER — Telehealth (INDEPENDENT_AMBULATORY_CARE_PROVIDER_SITE_OTHER): Payer: Self-pay | Admitting: *Deleted

## 2010-06-30 ENCOUNTER — Ambulatory Visit (INDEPENDENT_AMBULATORY_CARE_PROVIDER_SITE_OTHER): Payer: Medicare Other | Admitting: Endocrinology

## 2010-06-30 ENCOUNTER — Other Ambulatory Visit: Payer: Medicare Other

## 2010-06-30 ENCOUNTER — Encounter: Payer: Self-pay | Admitting: Endocrinology

## 2010-06-30 ENCOUNTER — Other Ambulatory Visit: Payer: Self-pay | Admitting: Endocrinology

## 2010-06-30 DIAGNOSIS — IMO0002 Reserved for concepts with insufficient information to code with codable children: Secondary | ICD-10-CM | POA: Insufficient documentation

## 2010-06-30 DIAGNOSIS — E119 Type 2 diabetes mellitus without complications: Secondary | ICD-10-CM

## 2010-06-30 DIAGNOSIS — K7689 Other specified diseases of liver: Secondary | ICD-10-CM

## 2010-06-30 DIAGNOSIS — E0865 Diabetes mellitus due to underlying condition with hyperglycemia: Secondary | ICD-10-CM | POA: Insufficient documentation

## 2010-06-30 LAB — HEPATIC FUNCTION PANEL
ALT: 59 U/L — ABNORMAL HIGH (ref 0–35)
AST: 56 U/L — ABNORMAL HIGH (ref 0–37)
Albumin: 4.2 g/dL (ref 3.5–5.2)
Alkaline Phosphatase: 54 U/L (ref 39–117)
Bilirubin, Direct: 0.2 mg/dL (ref 0.0–0.3)
Total Bilirubin: 0.8 mg/dL (ref 0.3–1.2)
Total Protein: 6.9 g/dL (ref 6.0–8.3)

## 2010-06-30 LAB — HEMOGLOBIN A1C: Hgb A1c MFr Bld: 6.7 % — ABNORMAL HIGH (ref 4.6–6.5)

## 2010-07-04 NOTE — Progress Notes (Signed)
Summary: Prior auth MED CHANGED TO DIOVAN  Phone Note Refill Request Message from:  Fax from Pharmacy on June 23, 2010 8:50 AM  Refills Requested: Medication #1:  BENICAR 20 MG TABS 1 by mouth once daily prior auth - [id number] ----id [id number] [id number]  Initial call taken by: Arbie [place],  June 23, 2010 8:52 AM  Follow-up for Phone Call        Fax received Tried to call Pt to verify if Pt has tried diovan or diovan HCT, Azor, Tribenzor, Exforge or exforge HCT. Will try again later.............Marland KitchenFelecia Deloach CMA  June 26, 2010 3:07 PM   Spoke with Pt she has not tried any of the following med pls advise on med change or PA........Marland KitchenFelecia Deloach CMA  June 28, 2010 10:07 AM   Additional Follow-up for Phone Call Additional follow up Details #1::        Can switch to Diovan 34m daily.  will need appt to recheck BP for 2 weeks after she starts new med Additional Follow-up by: KAnnye AsaMD,  June 28, 2010 11:38 AM    Additional Follow-up for Phone Call Additional follow up Details #2::    Left message to call office.......Marland Kitchenelecia Deloach CMA  June 28, 2010 3:32 PM   Discuss with patient.........Marland Kitchenelecia Deloach CMA  June 28, 2010 3:49 PM   New/Updated Medications: DIOVAN 80 MG TABS (VALSARTAN) Take 1 tab daily recheck BP 2 weeks Prescriptions: DIOVAN 80 MG TABS (VALSARTAN) Take 1 tab daily recheck BP 2 weeks  #30 x 0   Entered by:   FRolla FlattenCMA   Authorized by:   KAnnye AsaMD   Signed by:   FRolla FlattenCMA on 06/28/2010   Method used:   Faxed to ...       [facility]  [place] #[phone] (retail)       [address]      [place]  [address]      Ph: [phone]      Fax: [phone]  RxID:   [id number]

## 2010-07-05 ENCOUNTER — Telehealth (INDEPENDENT_AMBULATORY_CARE_PROVIDER_SITE_OTHER): Payer: Self-pay | Admitting: *Deleted

## 2010-07-08 ENCOUNTER — Encounter: Payer: Self-pay | Admitting: Family Medicine

## 2010-07-11 NOTE — Assessment & Plan Note (Signed)
Summary: 6 month follow up/ nws #   Vital Signs:  Patient profile:   65 year old female Height:      65.25 inches (165.74 cm) Weight:      189 pounds (85.91 kg) BMI:     31.32 O2 Sat:      94 % on Room air Temp:     97.8 degrees F (36.56 degrees C) oral Pulse rate:   66 / minute Pulse rhythm:   regular BP sitting:   150 / 88  (left arm) Cuff size:   regular  Vitals Entered By: Rebeca Alert CMA Deborra Medina) (June 30, 2010 8:59 AM)  O2 Flow:  Room air CC: 6 month F/U/ on DM and thryoid/blood test today/aj Is Patient Diabetic? Yes   Referring Provider:  Unice Cobble, MD Primary Provider:  Annye Asa MD  CC:  6 month F/U/ on DM and thryoid/blood test today/aj.  History of Present Illness: the status of at least 3 ongoing medical problems is addressed today: dm: pt states she feels well in general.  she says she requires more sleep than usual.  she has gained 4 lbs since last ov.  nash: no abd pain.   dyslipidemia: no edema   Current Medications (verified): 1)  Atenolol 25 Mg Tabs (Atenolol) .Marland Kitchen.. 1 By Mouth Once Daily 2)  Synthroid 150 Mcg Tabs (Levothyroxine Sodium) .... 0.47m Once Daily 3)  Nortriptyline Hcl 25 Mg Caps (Nortriptyline Hcl) ..Marland Kitchen. 1 By Mouth Once Daily 4)  Lexapro 20 Mg Tabs (Escitalopram Oxalate) ..Marland Kitchen. 1 1/2 By Mouth Once Daily 5)  Diovan 80 Mg Tabs (Valsartan) .... Take 1 Tab Daily Recheck Bp 2 Weeks 6)  Bayer Low Strength 81 Mg Tbec (Aspirin) ..Marland Kitchen. 1 By Mouth Once Daily 7)  Vitamin D3 5000 Units Tabs (Cholecalciferol) ..Marland Kitchen. 1 Once Daily 8)  Fenofibrate Micronized 200 Mg Caps (Fenofibrate Micronized) .... Take One Tablet Daily 9)  Janumet 50-1000 Mg Tabs (Sitagliptin-Metformin Hcl) ..Marland Kitchen. 1 Tab By Mouth Two Times A Day 10)  Accu-Chek Comfort Curve  Strp (Glucose Blood) .... Use As Directed 11)  Vitamin D (Ergocalciferol) 50000 Unit Caps (Ergocalciferol) .... Take 1 Tab Weekly For 12 Weeks Then Recheck Level  Allergies (verified): No Known Drug  Allergies  Past History:  Past Medical History: Last updated: 03/24/2010 DM GERD Graves Disease Osteopenia Hyperlipidemia  Review of Systems  The patient denies peripheral edema, chest pain, and dyspnea on exertion.    Physical Exam  General:  normal appearance.   Pulses:  dorsalis pedis intact bilat.  no carotid bruit  Extremities:  no deformity.  no ulcer on the feet.  feet are of normal color and temp.  no edema  Neurologic:  sensation is intact to touch on the feet    Impression & Recommendations:  Problem # 1:  DIABETES MELLITUS (ICD-250.00) needs increased rx, if it can be done without hypoglycemia  HgbA1C: 6.7 (12/21/2009)  Problem # 2:  FATTY LIVER DISEASE (ICD-571.8) actos would help this  Problem # 3:  HYPERLIPIDEMIA (ICD-272.4) actos may reduce the need for fenofibrate  CHOL: 169 (03/24/2010)   LDL: 110 (03/24/2010)   HDL: 37.70 (03/24/2010)   TG: 107.0 (03/24/2010)  Medications Added to Medication List This Visit: 1)  Actos 15 Mg Tabs (Pioglitazone hcl) ..Marland Kitchen. 1 tab once daily  Other Orders: TLB-A1C / Hgb A1C (Glycohemoglobin) (83036-A1C) TLB-Hepatic/Liver Function Pnl (80076-HEPATIC) Est. Patient Level IV ((19417  Patient Instructions: 1)  blood tests are being ordered for you today.  please  call [phone] to hear your test results. 2)  pending the test results, please add actos 15 mg once daily. 3)  Please schedule a follow-up appointment in 4 months. 4)  we can recheck the cholesterol upon return, to see if the actos reduces the need for fenofibrate Prescriptions: ACTOS 15 MG TABS (PIOGLITAZONE HCL) 1 tab once daily  #30 x 11   Entered and Authorized by:   Donavan Foil MD   Signed by:   Donavan Foil MD on 06/30/2010   Method used:   Electronically to        [facility]. [phone]* (retail)       [place], [place]  [address]       Ph: [phone]       Fax: [phone]   RxID:    [id number]    Orders Added: 1)  TLB-A1C / Hgb A1C (Glycohemoglobin) [83036-A1C] 2)  TLB-Hepatic/Liver Function Pnl [80076-HEPATIC] 3)  Est. Patient Level IV [01007]

## 2010-07-11 NOTE — Progress Notes (Signed)
Summary: Prior auth MED CHANGED LOSARTAN  Phone Note Refill Request Message from:  Fax from Pharmacy on July 05, 2010 2:26 PM  Refills Requested: Medication #1:  DIOVAN 80 MG TABS Take 1 tab daily recheck BP 2 weeks prior auth - id [id number] - no phone #  Initial call taken by: Arbie [place],  July 05, 2010 2:41 PM  Follow-up for Phone Call        Pharmacy suggest Pt try losartan because it is covered under Pt plan. Pls advise await response on PA or change Pt med....Marland KitchenMarland KitchenFelecia Deloach CMA  July 05, 2010 3:11 PM    coverage review in process for diovan awaiting response...Marland KitchenMarland KitchenFelecia Deloach CMA  July 05, 2010 3:09 PM   Additional Follow-up for Phone Call Additional follow up Details #1::        if prior Josem Kaufmann is denied can switch to losartan 37m daily and inform pt that the switch is due to insurance coverage. Additional Follow-up by: KAnnye AsaMD,  July 05, 2010 3:36 PM    Additional Follow-up for Phone Call Additional follow up Details #2::    Prior auth denied med changed to losartan. Left message to call office to inform Pt..............Marland Kitchenelecia Deloach CMA  July 05, 2010 4:01 PM   Pt aware of med change and Rx sent to pharmacy.Felecia Deloach CMA  July 06, 2010 9:52 AM   New/Updated Medications: LOSARTAN POTASSIUM 50 MG TABS (LOSARTAN POTASSIUM) Take 1 tab daily*blood pressure check in 2 weeks* Prescriptions: LOSARTAN POTASSIUM 50 MG TABS (LOSARTAN POTASSIUM) Take 1 tab daily*blood pressure check in 2 weeks*  #30 x 2   Entered by:   FRolla FlattenCMA   Authorized by:   KAnnye AsaMD   Signed by:   FRolla FlattenCMA on 07/05/2010   Method used:   Faxed to ...       [facility]  [place] #[phone] (retail)       [address]      [place]  [address]      Ph: [phone]      Fax: [phone]  RxID:   [id number]

## 2010-07-20 ENCOUNTER — Encounter: Payer: Self-pay | Admitting: Family Medicine

## 2010-07-20 ENCOUNTER — Ambulatory Visit (INDEPENDENT_AMBULATORY_CARE_PROVIDER_SITE_OTHER): Payer: Medicare Other | Admitting: Family Medicine

## 2010-07-20 DIAGNOSIS — I10 Essential (primary) hypertension: Secondary | ICD-10-CM

## 2010-07-20 DIAGNOSIS — E559 Vitamin D deficiency, unspecified: Secondary | ICD-10-CM

## 2010-07-20 NOTE — Assessment & Plan Note (Signed)
BP is poorly controlled but pt admits to increased recent stress.  Doesn't want to change or increase meds at this time.  Asymptomatic at this time.  Will follow closely.

## 2010-07-20 NOTE — Progress Notes (Signed)
  Subjective:    Patient ID: Breanna Webster, female    DOB: Oct 30, 1945, 65 y.o.   MRN: [id number]  HPI HTN- BP was previously well controlled on Benicar but was switched to Diovan and then Cozaar due to insurance requirements.  BP now not at goal.  Reports she has had increased stress in last 2 weeks.  Dog died suddenly.  No CP, SOB, HAs, visual changes, edema.  Vit D deficiency- due for follow up labs.   Review of Systems For ROS see HPI    Objective:   Physical Exam        Assessment & Plan:

## 2010-07-20 NOTE — Assessment & Plan Note (Signed)
Pt completed her 50,000 unit tx.  Recheck labs.

## 2010-07-20 NOTE — Patient Instructions (Signed)
Follow up in 4-6 weeks to recheck BP Continue the Losartan daily for blood pressure We'll notify you of your Vitamin D level Call with any questions or concerns [place]!

## 2010-07-23 LAB — VITAMIN D 1,25 DIHYDROXY
Vitamin D 1, 25 (OH)2 Total: 55 pg/mL (ref 18–72)
Vitamin D2 1, 25 (OH)2: 33 pg/mL
Vitamin D3 1, 25 (OH)2: 22 pg/mL

## 2010-07-24 ENCOUNTER — Encounter: Payer: Self-pay | Admitting: *Deleted

## 2010-08-07 ENCOUNTER — Telehealth: Payer: Self-pay | Admitting: *Deleted

## 2010-08-07 ENCOUNTER — Other Ambulatory Visit: Payer: Self-pay | Admitting: *Deleted

## 2010-08-07 MED ORDER — LEVOTHYROXINE SODIUM 150 MCG PO TABS: 150.0000 ug | ORAL_TABLET | Freq: Every day | ORAL | Status: DC

## 2010-08-07 MED ORDER — NORTRIPTYLINE HCL 25 MG PO CAPS: 25.0000 mg | ORAL_CAPSULE | Freq: Every day | ORAL | Status: DC

## 2010-08-07 NOTE — Telephone Encounter (Signed)
Sent by MD.

## 2010-08-07 NOTE — Telephone Encounter (Signed)
Last refilled 03/2010. Please advise.

## 2010-08-07 NOTE — Telephone Encounter (Signed)
She is to stop the prescription strength (50,000 units) and take a daily 1000 unit supplement

## 2010-08-07 NOTE — Telephone Encounter (Signed)
Left Pt detail message of dr Birdie Riddle comments and to return call with any further concerns.

## 2010-08-07 NOTE — Telephone Encounter (Signed)
TSH just done 03/2000. Refill sent.

## 2010-08-07 NOTE — Telephone Encounter (Signed)
Pt left VM that she received labs in mail but is unsure if she needs to continue with Rx dose of vitamin-d or if OTC would be enough. Per result Vit D looks good! Vitamin D =Total 55  Range 18 - 72 pg/mL.Please advise

## 2010-08-17 ENCOUNTER — Encounter: Payer: Self-pay | Admitting: Family Medicine

## 2010-08-17 ENCOUNTER — Ambulatory Visit (INDEPENDENT_AMBULATORY_CARE_PROVIDER_SITE_OTHER): Payer: Medicare Other | Admitting: Family Medicine

## 2010-08-17 DIAGNOSIS — I10 Essential (primary) hypertension: Secondary | ICD-10-CM

## 2010-08-17 NOTE — Progress Notes (Signed)
  Subjective:    Patient ID: Breanna Webster, female    DOB: 08-31-1945, 65 y.o.   MRN: [id number]  HPI HTN- Pt reports BP has been well controlled at home.  No CP, SOB, HAs, visual changes, edema.  Previous visit BP was elevated but pt was stressed.  Reports feeling well today.   Review of Systems For ROS see HPI     Objective:   Physical Exam  Constitutional: She is oriented to person, place, and time. She appears well-developed and well-nourished. No distress.  HENT:  Head: Normocephalic and atraumatic.  Eyes: Conjunctivae and EOM are normal. Pupils are equal, round, and reactive to light.  Neck: Normal range of motion. Neck supple. No thyromegaly present.  Cardiovascular: Normal rate, regular rhythm and intact distal pulses.   Murmur heard.      I-II/VI SEM  Pulmonary/Chest: Effort normal and breath sounds normal. No respiratory distress.  Abdominal: Soft. She exhibits no distension. There is no tenderness.  Musculoskeletal: She exhibits no edema.  Lymphadenopathy:    She has no cervical adenopathy.  Neurological: She is alert and oriented to person, place, and time.  Skin: Skin is warm and dry.  Psychiatric: She has a normal mood and affect. Her behavior is normal.          Assessment & Plan:

## 2010-08-17 NOTE — Patient Instructions (Signed)
Follow up in 3 months to recheck cholesterol- do not eat before this appt Your blood pressure looks much better today! Continue the Losartan daily Call with any questions or concerns [place]!

## 2010-08-17 NOTE — Assessment & Plan Note (Signed)
BP better controlled today.  Pt asymptomatic.  No med changes.

## 2010-08-23 ENCOUNTER — Ambulatory Visit: Payer: Self-pay | Admitting: Family Medicine

## 2010-08-24 ENCOUNTER — Ambulatory Visit (INDEPENDENT_AMBULATORY_CARE_PROVIDER_SITE_OTHER): Payer: Medicare Other | Admitting: Family Medicine

## 2010-08-24 ENCOUNTER — Encounter: Payer: Self-pay | Admitting: Family Medicine

## 2010-08-24 DIAGNOSIS — N951 Menopausal and female climacteric states: Secondary | ICD-10-CM

## 2010-08-24 DIAGNOSIS — R232 Flushing: Secondary | ICD-10-CM | POA: Insufficient documentation

## 2010-08-24 DIAGNOSIS — E785 Hyperlipidemia, unspecified: Secondary | ICD-10-CM

## 2010-08-24 DIAGNOSIS — R110 Nausea: Secondary | ICD-10-CM | POA: Insufficient documentation

## 2010-08-24 LAB — LIPID PANEL
Cholesterol: 163 mg/dL (ref 0–200)
HDL: 42.5 mg/dL (ref >39.00)
LDL Cholesterol: 100 mg/dL — ABNORMAL HIGH (ref 0–99)
Total CHOL/HDL Ratio: 4
Triglycerides: 105 mg/dL (ref 0.0–149.0)
VLDL: 21 mg/dL (ref 0.0–40.0)

## 2010-08-24 LAB — CBC WITH DIFFERENTIAL/PLATELET
Basophils Absolute: 0 10*3/uL (ref 0.0–0.1)
Basophils Relative: 0.6 % (ref 0.0–3.0)
Eosinophils Absolute: 0.1 10*3/uL (ref 0.0–0.7)
Eosinophils Relative: 2.2 % (ref 0.0–5.0)
HCT: 35.8 % — ABNORMAL LOW (ref 36.0–46.0)
Hemoglobin: 11.2 g/dL — ABNORMAL LOW (ref 12.0–15.0)
Lymphocytes Relative: 21.3 % (ref 12.0–46.0)
Lymphs Abs: 1.2 10*3/uL (ref 0.7–4.0)
MCHC: 31.4 g/dL (ref 30.0–36.0)
MCV: 64.5 fl — ABNORMAL LOW (ref 78.0–100.0)
Monocytes Absolute: 0.5 10*3/uL (ref 0.1–1.0)
Monocytes Relative: 8.7 % (ref 3.0–12.0)
Neutro Abs: 3.8 10*3/uL (ref 1.4–7.7)
Neutrophils Relative %: 67.2 % (ref 43.0–77.0)
Platelets: 269 10*3/uL (ref 150.0–400.0)
RBC: 5.55 Mil/uL — ABNORMAL HIGH (ref 3.87–5.11)
RDW: 16.1 % — ABNORMAL HIGH (ref 11.5–14.6)
WBC: 5.7 10*3/uL (ref 4.5–10.5)

## 2010-08-24 LAB — BASIC METABOLIC PANEL
BUN: 19 mg/dL (ref 6–23)
CO2: 27 mEq/L (ref 19–32)
Calcium: 10 mg/dL (ref 8.4–10.5)
Chloride: 103 mEq/L (ref 96–112)
Creatinine, Ser: 0.9 mg/dL (ref 0.4–1.2)
GFR: 70.95 mL/min (ref >60.00)
Glucose, Bld: 137 mg/dL — ABNORMAL HIGH (ref 70–99)
Potassium: 5.1 mEq/L (ref 3.5–5.1)
Sodium: 139 mEq/L (ref 135–145)

## 2010-08-24 LAB — HEPATIC FUNCTION PANEL
ALT: 30 U/L (ref 0–35)
AST: 29 U/L (ref 0–37)
Albumin: 4.2 g/dL (ref 3.5–5.2)
Alkaline Phosphatase: 52 U/L (ref 39–117)
Bilirubin, Direct: 0.2 mg/dL (ref 0.0–0.3)
Total Bilirubin: 0.9 mg/dL (ref 0.3–1.2)
Total Protein: 7.1 g/dL (ref 6.0–8.3)

## 2010-08-24 LAB — AMYLASE: Amylase: 38 U/L (ref 27–131)

## 2010-08-24 LAB — LIPASE: Lipase: 25 U/L (ref 11.0–59.0)

## 2010-08-24 NOTE — Patient Instructions (Signed)
Follow up in 6 months for your complete physical If you continue to have nausea- please let me or your GI doctor know We'll notify you of your lab results Call with any questions or concerns Have a wonderful trip!!!

## 2010-08-24 NOTE — Progress Notes (Signed)
  Subjective:    Patient ID: Breanna Webster, female    DOB: 07-04-1945, 65 y.o.   MRN: [id number]  HPI Hyperlipidemia- chronic problem for pt, LDL goal is <70 due to DM.  On fenofibrate.  Intolerant to statins.  hot flashes- went through menopause 15 yrs ago.  Had horrible hot flashes at that time.  sxs started a few months ago- feels worse since adding Actos.  Denies dizziness or nausea associated w/ sweating- doesn't feel it's hypoglycemia.  Nausea after eating- occurs daily.  Typically occurs after eating.  Will last 10-15 minutes.  Started 3 months ago (around time of adding Actos).  No vomiting.  No diarrhea.   Review of Systems For ROS see HPI     Objective:   Physical Exam  Constitutional: She is oriented to person, place, and time. She appears well-developed and well-nourished. No distress.  HENT:  Head: Normocephalic and atraumatic.  Eyes: Conjunctivae and EOM are normal. Pupils are equal, round, and reactive to light.  Neck: Normal range of motion. Neck supple. No thyromegaly present.  Cardiovascular: Normal rate, regular rhythm, normal heart sounds and intact distal pulses.   No murmur heard. Pulmonary/Chest: Effort normal and breath sounds normal. No respiratory distress.  Abdominal: Soft. She exhibits no distension. There is no tenderness.  Musculoskeletal: She exhibits no edema.  Lymphadenopathy:    She has no cervical adenopathy.  Neurological: She is alert and oriented to person, place, and time.  Skin: Skin is warm and dry. She is not diaphoretic.  Psychiatric: She has a normal mood and affect. Her behavior is normal.          Assessment & Plan:

## 2010-08-25 ENCOUNTER — Encounter: Payer: Self-pay | Admitting: *Deleted

## 2010-09-01 NOTE — Assessment & Plan Note (Signed)
Intolerant to statins, taking fenofibrate.  Due for labs.  Encouraged healthy diet and regular exercise.

## 2010-09-01 NOTE — Assessment & Plan Note (Signed)
sxs started around time she started Actos.  Pt denies low or high CBGs.  May be relative hypoglycemia if sugars are normalizing.  If sxs don't improve w/ time suggested she discuss this w/ Dr Loanne Drilling.  Pt in agreement.

## 2010-09-01 NOTE — Assessment & Plan Note (Signed)
Again sxs seem to coincide w/ starting Actos.  Will check labs to r/o infxn, metabolic abnormality.  If no improvement recommended pt discuss this w/ GI (which was pt's initial plan).  She seems unconcerned by this.  Will continue to follow.

## 2010-09-08 NOTE — Consult Note (Signed)
[place]  Patient:    Breanna Webster, Breanna Webster Visit Number: [id number] MRN: [id number]          Service Type: EMS Location: [facility] Attending Physician:  Lajean Saver Dictated by:   Vena Rua, P.A. Proc. Date: 03/24/01 Admit Date:  03/24/2001   CC:         Sandy Salaam. Deatra Ina, M.D. [facility]  Juanda Bond. Altheimer, M.D.  Charles A. Harrington Challenger, M.D.   Consultation Report  EMERGENCY ROOM CONSULTATION  PRIMARY CARE PHYSICIAN:  Charles A. Harrington Challenger, M.D.  The patient seen in the ED and was not admitted.  REASON FOR EVALUATION:  Unexplained chest and upper abdominal pain.  HISTORY OF PRESENT ILLNESS:  This patient is a 65 year old white female.  She has a long history of chest pain and has a history of gastroesophageal reflux disease.  She has had lots of chest pain leading up to her fundoplication in 7342.  This had been performed by Dr. Dalbert Batman because of chronic reflux symptoms consisting of chest pain.  She says that she did not have a hiatal hernia; however, it is not clear whether or not she did or did not have a hiatal hernia.  She has also undergone laparoscopic cholecystectomy and has a history of Graves disease treated with irradiation and subsequent development of hypothyroidism for which she is on thyroid replacement.  Because of chest pain she had had catheterization by Dr. Tamala Julian about 3-4 years ago and, per the patient history, this was negative.  In the last few months, and specifically in the last several weeks, the patient has had a progressive worsening of her chest pain which is midsternal and can radiate into the back, but she also has isolated back pain as well, but has no known musculoskeletal or degenerative disk disease.  The patient sought Dr. Deatra Ina out because her symptoms were almost exactly the same symptoms she had had prior to her fundoplication.  He performed an upper endoscopy on 03/18/01 and found, what he thought looked like  Barretts esophagus, but no other specific mucosal disease on the upper endoscopy. Esophageal biopsies returned with diagnosis of benign esophageal mucosa and gastric cardia-type mucosa with inflammation and fibrosis.  The pathologist did not see any intestinal metaplasia.  After the endoscopy the patients Prilosec was increased from once daily to twice daily.  She also had Cardizem added for control of what he felt could be esophageal spasm related chest pain.  However, the patients symptoms have not improved.  In fact, over the last 5-6 days they have gotten persistent and quite severe.  She does not use any chronic pain medication and pretty much relies on her Prilosec, and more recently, the Cardizem for control of the symptoms.  The patient called Dr. Delfin Edis early in the morning of December 2, and was advised to proceed to the ED.  In the ED the patient was uncomfortable.  An acute abdominal series was obtained which showed no significant findings other than some stool in the colon.  Multiple labs including a CMET, CBC, liver enzymes, lipase and amylase were drawn.  These were not helpful and, therefore, a chest CT of the abdomen was obtained.  This showed no hiatal hernia.  The fundoplication looked fine and she had no chest or cardiac abnormalities on imaging studies.  Cardiac enzymes and troponin I was also drawn as well as an EKG and she ruled out for any acute cardiac ischemia.  Her CBC did reveal a  microcytosis with MCV of 61.1 so a ferritin level was pending at the time of this dictation.  She was only minimally anemic at 11.8 but does have a history of thalassemia minor and says that she has been chronically anemia.  While in the emergency room she was given 50 of Demerol along with Phenergan IV.  This made her quite sleepy, in fact she did not remember having had the CT scan performed because of the influence of these medications.  It did not completely resolve her  chest/epigastric and back pain, but was a lot more comfortable after these medications.  With the results of the above testing completed, Dr. Deatra Ina felt that the patient was stable for discharge home with new prescriptions for Vicodin and Carafate.  She was set up for a barium swallow and upper GI series to be performed the following day at [facility].  Once these tests are resulted Dr. Deatra Ina will be in touch with the patient, over the phone, and will order further testing as indicated.  PAST MEDICAL HISTORY: 1. Gastroesophageal reflux disease. 2. Status post laparoscopic fundoplication by Dr. Dalbert Batman in 1997. 3. Status post laparoscopic cholecystectomy. 4. Status post laparoscopic hysteroscopy because of uterine fibroids. 5. Hypothyroidism following treatment for Graves disease. 6. History of panic disorder. 7. Mitral valve prolapse with history of associated tachycardia treated with    beta blocker. 8. Gravida 2, para 2, both vaginal deliveries. 9. Status post cardiac catheterization by Dr. Tamala Julian, specific details and    dates not known, but performed approximately 4 years ago.  FAMILY HISTORY:  There is no family history of Barretts esophagus, hiatal hernia, or unexplained chest pain.  SOCIAL HISTORY:  The patient is retired and lives in [place] with her husband.  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  She does not have a history of tobacco smoking, and she denies any current alcohol use.  She has never been a heavy drinker.  REVIEW OF SYSTEMS:  Denies dysphagia or odynophagia.  She denies melena, or blood per rectum.  Does occasionally have nausea in the morning but does not throw up.  PULMONARY:  No cough, no shortness of breath, no pleuritic component to her pain.  CARDIOVASCULAR:  Chest pain, as above.  This is not  exertional.  She does not have lower extremity edema.  She does not have claudication.  GU:  No dysuria or urinary  frequency.  MUSCULOSKELETAL:  Does have back pain which is associated with the chest pain, but sometimes has thoracic area pain on its own.  PSYCHIATRIC:  The patient denies recent panic attacks and she has been on Pamelor for many years because of prior history of panic attacks.  She actually has an appointment, coming up soon, to see a neuropharmacologist, that she found out about at [facility] whom she hopes will help her review her psychiatric related medications and perhaps be able to advise her to get off the Pamelor. The patient denies emotional upset, but her husband does say that she is under a lot of stress and has some family and emotional issues that, he feels, may underlie some of her problems. DERMATOLOGIC:  She has not had any recent rashes or worrisome skin growths.  MEDICATIONS: 1. Synthroid 0.137 mg p.o. q.a.m. 2. Atenolol 25 mg p.o. b.i.d. 3. Prilosec 20 mg p.o. b.i.d. 4. Pamelor 25 mg p.o. q.h.s. 5. Cardizem dose believed to be 30 mg p.o. t.i.d.  ALLERGIES:  PENICILLIN.  PHYSICAL EXAMINATION:  GENERAL:  The patient is an exhausted appearing white female who is uncomfortable, anxious and slightly hoarse.  VITAL SIGNS:  Blood pressure is 143/71; pulse is 68; respirations are 20; temperature is 97.4.  HEENT:  Sclerae were anicteric, conjunctivae are pink.  Extraocular movements are intact.  PERRL.  Oropharynx and teeth are in good repair.  The oral mucosa is slightly dry, but without exudates or lesions.  NECK:  There is no JVD.  I do not feel a goiter.  No masses or bruits.  LUNGS:  Clear to auscultation and percussion bilaterally.  There is no dullness to percussion.  Breath sounds are excellent.  BREASTS:  Exam was not performed.  HEART:  There is a regular rate and rhythm without murmurs, rubs, or gallops.  CHEST:  The chest wall is somewhat tender to palpation, but palpation is not eliciting the specific pain that she has been complaining  of.  ABDOMEN:  Soft, nondistended with active bowel sounds.  There is some slight left lower quadrant tenderness.  There is no hepatosplenomegaly or masses appreciated.  GENITOURINARY:  Exam was not performed.  RECTAL:  Exam was deferred as well.  EXTREMITIES:  There is no clubbing, cyanosis, or edema of the extremities. Her feet are warm and dry.  SKIN:  The color is slightly sallow.  LABORATORY DATA:  Sodium 139, potassium 3.4, BUN 16, creatinine 0.8, glucose 125.  Total bilirubin 0.9, alkaline phosphatase 61, AST of 26, ALT of 29. Albumin is 3.9.  Amylase is 40, lipase is 18, white blood cell count is 7.4, hemoglobin 11.8, hematocrit 35.8, MCV is 61.1, platelets are 278,000. Ferritin level was pending at the time of this dictation.  CK was 44.  CKMB was 1.0.  Troponin I was less than 0.01.  The EKG shows sinus tachycardia at a rate of 57 beats per minute.  There are no ST or T wave changes or T waves evidenced.  IMPRESSION: 1. Chest, upper abdominal pain.  The patients symptoms are not explained by    her recent upper endoscopy.  No explanation for the pain on imaging study    today consisting of acute abdominal series as well as chest and abdominal    CT scan.  She has had her gallbladder out and her liver enzymes are within    normal limits.  There is no evidence for pancreatitis.  The symptoms are    consistent with symptoms previously attributed to gastroesophageal reflux    disease.  There is no evidence that this pain is cardiac in etiology.    Other possible etiologies include esophageal spasm, cardiac spasm,    functional intestinal pain, functional upper gastrointestinal tract pain. 2. Barretts esophagus.  The biopsy does not confirm any intestinal    metaplasia.  The patient is on chronic PPIs.  PLAN:  At this point, further testing is warranted to rule out the possibility of esophageal spasm and therefore, she is set up for a barium swallow/upper GI series to be  performed tomorrow, in the morning, at [facility].  Further testing may include pH probe study to ascertain the existence of recurrent reflux.  The patient has an appointment about 3 weeks from now to return office visit with Dr. Deatra Ina and she should keep this along with appointments that she has scheduled with the neuropharmacologist at [facility] along with plans to reschedule an office appointment with Dr. Elyse Hsu. Dictated by:   Vena Rua, P.A. Attending Physician:  Lajean Saver DD:  03/24/01 TD:  03/25/01 Job: [id number]

## 2010-09-19 ENCOUNTER — Telehealth: Payer: Self-pay | Admitting: *Deleted

## 2010-09-19 NOTE — Telephone Encounter (Signed)
Pt states that she has a Dx of thalassemia which is small red blood cell. Pt notes that in the past she was advise that she was not supposed to take iron. Pt would like to know if Dr Birdie Riddle would still like her to take iron.Please advise

## 2010-09-20 NOTE — Telephone Encounter (Signed)
If pt was told previously not to take iron I will respect this and we will just continue to follow her blood counts.

## 2010-09-20 NOTE — Telephone Encounter (Signed)
Discuss with patient  

## 2010-09-25 ENCOUNTER — Other Ambulatory Visit (INDEPENDENT_AMBULATORY_CARE_PROVIDER_SITE_OTHER): Payer: Medicare Other

## 2010-09-25 ENCOUNTER — Encounter: Payer: Self-pay | Admitting: Endocrinology

## 2010-09-25 ENCOUNTER — Ambulatory Visit (INDEPENDENT_AMBULATORY_CARE_PROVIDER_SITE_OTHER): Payer: Medicare Other | Admitting: Endocrinology

## 2010-09-25 DIAGNOSIS — E119 Type 2 diabetes mellitus without complications: Secondary | ICD-10-CM

## 2010-09-25 DIAGNOSIS — E785 Hyperlipidemia, unspecified: Secondary | ICD-10-CM

## 2010-09-25 DIAGNOSIS — K7689 Other specified diseases of liver: Secondary | ICD-10-CM

## 2010-09-25 LAB — TSH: TSH: 1.29 u[IU]/mL (ref 0.35–5.50)

## 2010-09-25 LAB — HEMOGLOBIN A1C: Hgb A1c MFr Bld: 6.2 % (ref 4.6–6.5)

## 2010-09-25 MED ORDER — CITALOPRAM HYDROBROMIDE 40 MG PO TABS: 40.0000 mg | ORAL_TABLET | Freq: Every day | ORAL | Status: DC

## 2010-09-25 MED ORDER — CITALOPRAM HYDROBROMIDE 20 MG PO TABS: 20.0000 mg | ORAL_TABLET | Freq: Every day | ORAL | Status: DC

## 2010-09-25 MED ORDER — SITAGLIP PHOS-METFORMIN HCL ER 50-1000 MG PO TB24: 1.0000 | ORAL_TABLET | Freq: Two times a day (BID) | ORAL | Status: DC

## 2010-09-25 NOTE — Patient Instructions (Addendum)
blood tests are being ordered for you today.  please call [phone] to hear your test results.  You will be prompted to enter the 9-digit "MRN" number that appears at the top left of this page, followed by #.  Then you will hear the message. Change janumet to janumet-xr 50/1000, 1 pill 2x a day Please make a follow-up appointment in 4 months Change lexapro to citalopram 20 mg daily. Stop fenofibrate for now. (update: i left message on phone-tree:  rx as we discussed).

## 2010-09-25 NOTE — Progress Notes (Signed)
Subjective:    Patient ID: Breanna Webster, female    DOB: Aug 04, 1945, 65 y.o.   MRN: [id number]  HPI The state of at least three ongoing medical problems is addressed today: pt states she feels well in general, except for nausea. Breanna Webster:  Denies abd pain Dysipidemia: she says her diet is good.  Depression.  She says this is .well-controlled, but she wants a cheaper med. Past Medical History  Diagnosis Date  . Diabetes mellitus   . GERD (gastroesophageal reflux disease)   . Grave's disease   . Hyperlipidemia   . Osteopenia     Past Surgical History  Procedure Date  . Hysteroscopy     fibroids  . Laparoscopy     fibroids  . Cholecystectomy   . Hiatal hernia repair     History   Social History  . Marital Status: Married    Spouse Name: N/A    Number of Children: N/A  . Years of Education: N/A   Occupational History  . Not on file.   Social History Main Topics  . Smoking status: Never Smoker   . Smokeless tobacco: Not on file  . Alcohol Use: No  . Drug Use: No  . Sexually Active:    Other Topics Concern  . Not on file   Social History Narrative  . No narrative on file    Current Outpatient Prescriptions on File Prior to Visit  Medication Sig Dispense Refill  . aspirin 81 MG tablet Take 81 mg by mouth daily.        Marland Kitchen atenolol (TENORMIN) 25 MG tablet Take 25 mg by mouth daily.        . Cholecalciferol (VITAMIN D) 1000 UNITS capsule Take 1,000 Units by mouth daily.        Marland Kitchen glucose blood (ACCU-CHEK COMFORT CURVE) test strip 1 each by Other route as directed. Use as instructed       . levothyroxine (SYNTHROID) 150 MCG tablet Take 1 tablet (150 mcg total) by mouth daily.  90 tablet  0  . losartan (COZAAR) 50 MG tablet Take 50 mg by mouth daily.        . nortriptyline (PAMELOR) 25 MG capsule Take 1 capsule (25 mg total) by mouth at bedtime.  90 capsule  1  . pioglitazone (ACTOS) 15 MG tablet Take 15 mg by mouth daily.          No Known Allergies  Family  History  Problem Relation Age of Onset  . Adopted: Yes    BP 138/82  Pulse 70  Temp(Src) 98.7 F (37.1 C) (Oral)  Ht 5' 5"  (1.651 m)  Wt 187 lb (84.823 kg)  BMI 31.12 kg/m2  SpO2 96%    Review of Systems Denies weight change.  She had leg edema during a recent trip to [place], but it has resolved.      Objective:   Physical Exam Pulses: dorsalis pedis intact bilat.   Feet: no deformity.  no ulcer on the feet.  feet are of normal color and temp.  no edema Neuro: sensation is intact to touch on the feet    Lab Results  Component Value Date   HGBA1C 6.2 09/25/2010   Lab Results  Component Value Date   ALT 30 08/24/2010   AST 29 08/24/2010   ALKPHOS 52 08/24/2010   BILITOT 0.9 08/24/2010   Lab Results  Component Value Date   TSH 1.29 09/25/2010   Lab Results  Component Value Date  CHOL 163 08/24/2010   CHOL 169 03/24/2010   CHOL 184 09/12/2009   Lab Results  Component Value Date   HDL 42.50 08/24/2010   HDL 37.70* 03/24/2010   HDL 33.20* 09/12/2009   Lab Results  Component Value Date   LDLCALC 100* 08/24/2010   LDLCALC 110* 03/24/2010   LDLCALC 124* 09/12/2009   Lab Results  Component Value Date   TRIG 105.0 08/24/2010   TRIG 107.0 03/24/2010   TRIG 136.0 09/12/2009   Lab Results  Component Value Date   CHOLHDL 4 08/24/2010   CHOLHDL 4 03/24/2010   CHOLHDL 6 09/12/2009      Assessment & Plan:  Dm, well-controlled Nash, resolved with actos Dyslipidemia, well-controlled.   She may not need fenofibrate now Depression.  She wants a cheaper med Nausea, possibly due to metformin.

## 2010-09-27 ENCOUNTER — Other Ambulatory Visit: Payer: Self-pay | Admitting: Family Medicine

## 2010-10-09 ENCOUNTER — Other Ambulatory Visit: Payer: Self-pay | Admitting: *Deleted

## 2010-10-09 MED ORDER — LOSARTAN POTASSIUM 50 MG PO TABS: 50.0000 mg | ORAL_TABLET | Freq: Every day | ORAL | Status: DC

## 2010-10-09 MED ORDER — NORTRIPTYLINE HCL 25 MG PO CAPS: 25.0000 mg | ORAL_CAPSULE | Freq: Every day | ORAL | Status: DC

## 2010-10-09 MED ORDER — PIOGLITAZONE HCL 15 MG PO TABS: 15.0000 mg | ORAL_TABLET | Freq: Every day | ORAL | Status: DC

## 2010-10-09 MED ORDER — LEVOTHYROXINE SODIUM 150 MCG PO TABS: 150.0000 ug | ORAL_TABLET | Freq: Every day | ORAL | Status: DC

## 2010-10-09 MED ORDER — SITAGLIP PHOS-METFORMIN HCL ER 50-1000 MG PO TB24: 1.0000 | ORAL_TABLET | Freq: Two times a day (BID) | ORAL | Status: DC

## 2010-10-09 MED ORDER — CITALOPRAM HYDROBROMIDE 20 MG PO TABS: 20.0000 mg | ORAL_TABLET | Freq: Every day | ORAL | Status: DC

## 2010-10-09 MED ORDER — ATENOLOL 25 MG PO TABS: 25.0000 mg | ORAL_TABLET | Freq: Every day | ORAL | Status: DC

## 2010-10-09 NOTE — Telephone Encounter (Signed)
Please advise of refills (one or more meds is controlled substance).

## 2010-10-09 NOTE — Telephone Encounter (Signed)
Refills sent. Pt spouse aware.

## 2010-10-09 NOTE — Telephone Encounter (Signed)
Ok for 6 months 

## 2010-12-04 ENCOUNTER — Other Ambulatory Visit: Payer: Self-pay | Admitting: Family Medicine

## 2010-12-04 NOTE — Telephone Encounter (Signed)
Refills sent to pharmacy October 09, 2010 #90 with 1 refill on each one. Not time to refill

## 2010-12-28 ENCOUNTER — Other Ambulatory Visit (HOSPITAL_COMMUNITY): Payer: Self-pay | Admitting: *Deleted

## 2010-12-28 DIAGNOSIS — Z1231 Encounter for screening mammogram for malignant neoplasm of breast: Secondary | ICD-10-CM

## 2011-01-11 ENCOUNTER — Ambulatory Visit (HOSPITAL_COMMUNITY)
Admission: RE | Admit: 2011-01-11 | Discharge: 2011-01-11 | Disposition: A | Discharge status: Home / Self Care | Payer: Medicare Other | Source: Ambulatory Visit | Attending: Family Medicine | Admitting: Family Medicine

## 2011-01-11 DIAGNOSIS — Z1231 Encounter for screening mammogram for malignant neoplasm of breast: Secondary | ICD-10-CM | POA: Insufficient documentation

## 2011-01-22 ENCOUNTER — Other Ambulatory Visit (INDEPENDENT_AMBULATORY_CARE_PROVIDER_SITE_OTHER): Payer: Medicare Other

## 2011-01-22 ENCOUNTER — Encounter: Payer: Self-pay | Admitting: Endocrinology

## 2011-01-22 ENCOUNTER — Ambulatory Visit (INDEPENDENT_AMBULATORY_CARE_PROVIDER_SITE_OTHER): Payer: Medicare Other | Admitting: Endocrinology

## 2011-01-22 DIAGNOSIS — E785 Hyperlipidemia, unspecified: Secondary | ICD-10-CM

## 2011-01-22 DIAGNOSIS — K7689 Other specified diseases of liver: Secondary | ICD-10-CM

## 2011-01-22 DIAGNOSIS — E119 Type 2 diabetes mellitus without complications: Secondary | ICD-10-CM

## 2011-01-22 DIAGNOSIS — E039 Hypothyroidism, unspecified: Secondary | ICD-10-CM

## 2011-01-22 LAB — HEPATIC FUNCTION PANEL
ALT: 21 U/L (ref 0–35)
AST: 22 U/L (ref 0–37)
Albumin: 3.9 g/dL (ref 3.5–5.2)
Alkaline Phosphatase: 68 U/L (ref 39–117)
Bilirubin, Direct: 0.1 mg/dL (ref 0.0–0.3)
Total Bilirubin: 0.8 mg/dL (ref 0.3–1.2)
Total Protein: 7.1 g/dL (ref 6.0–8.3)

## 2011-01-22 LAB — LIPID PANEL
Cholesterol: 175 mg/dL (ref 0–200)
HDL: 45.3 mg/dL (ref >39.00)
LDL Cholesterol: 109 mg/dL — ABNORMAL HIGH (ref 0–99)
Total CHOL/HDL Ratio: 4
Triglycerides: 103 mg/dL (ref 0.0–149.0)
VLDL: 20.6 mg/dL (ref 0.0–40.0)

## 2011-01-22 LAB — TSH: TSH: 0.41 u[IU]/mL (ref 0.35–5.50)

## 2011-01-22 LAB — HEMOGLOBIN A1C: Hgb A1c MFr Bld: 6 % (ref 4.6–6.5)

## 2011-01-22 NOTE — Progress Notes (Signed)
Subjective:    Patient ID: Breanna Webster, female    DOB: 1945/11/13, 65 y.o.   MRN: [id number]  HPI The state of at least three ongoing medical problems is addressed today: dyslipidemia: pt states she feels well in general.  She has lost weight, due to her efforts.   DM: Pt says she has lightheadedness if a meal is delayed.  She checks cbg if she does not feel well, and it is well-controlled. [name]:  Denies abd pain. Past Medical History  Diagnosis Date  . Diabetes mellitus   . GERD (gastroesophageal reflux disease)   . Grave's disease   . Hyperlipidemia   . Osteopenia     Past Surgical History  Procedure Date  . Hysteroscopy     fibroids  . Laparoscopy     fibroids  . Cholecystectomy   . Hiatal hernia repair     History   Social History  . Marital Status: Married    Spouse Name: N/A    Number of Children: N/A  . Years of Education: N/A   Occupational History  . Not on file.   Social History Main Topics  . Smoking status: Never Smoker   . Smokeless tobacco: Not on file  . Alcohol Use: No  . Drug Use: No  . Sexually Active:    Other Topics Concern  . Not on file   Social History Narrative  . No narrative on file    Current Outpatient Prescriptions on File Prior to Visit  Medication Sig Dispense Refill  . aspirin 81 MG tablet Take 81 mg by mouth daily.        Marland Kitchen atenolol (TENORMIN) 25 MG tablet Take 1 tablet (25 mg total) by mouth daily.  90 tablet  1  . citalopram (CELEXA) 20 MG tablet Take 1 tablet (20 mg total) by mouth daily. Please cancel rx for 40 mg just sent  90 tablet  1  . glucose blood (ACCU-CHEK COMFORT CURVE) test strip 1 each by Other route as directed. Use as instructed       . levothyroxine (SYNTHROID) 150 MCG tablet Take 1 tablet (150 mcg total) by mouth daily.  90 tablet  1  . losartan (COZAAR) 50 MG tablet Take 1 tablet (50 mg total) by mouth daily.  90 tablet  1  . nortriptyline (PAMELOR) 25 MG capsule Take 1 capsule (25 mg total) by  mouth at bedtime.  90 capsule  1  . pioglitazone (ACTOS) 15 MG tablet Take 1 tablet (15 mg total) by mouth daily.  90 tablet  1  . SitaGLIPtin-MetFORMIN HCl (JANUMET XR) 50-1000 MG TB24 Take 1 tablet by mouth 2 (two) times daily.  180 tablet  1  . Cholecalciferol (VITAMIN D) 1000 UNITS capsule Take 1,000 Units by mouth daily.          No Known Allergies  Family History  Problem Relation Age of Onset  . Adopted: Yes   BP 136/86  Pulse 64  Temp(Src) 98 F (36.7 C) (Oral)  Ht 5' 5"  (1.651 m)  Wt 178 lb 1.9 oz (80.795 kg)  BMI 29.64 kg/m2  SpO2 94%  Review of Systems  Respiratory: Negative for shortness of breath.   Cardiovascular: Negative for chest pain.      Objective:   Physical Exam VITAL SIGNS:  See vs page GENERAL: no distress Pulses: dorsalis pedis intact bilat.   Feet: no deformity.  no ulcer on the feet.  feet are of normal color and temp.  no edema Neuro: sensation is intact to touch on the feet     Assessment & Plan:  Dm.  She needs aggressive rx, if it can be done with a regimen that avoids or minimizes hypoglycemia. Dyslipidemia.  She might be able to substitute the actos for the fenofibrate. [name].  The actos may help this

## 2011-01-22 NOTE — Patient Instructions (Addendum)
blood tests are being ordered for you today.  please call [phone] to hear your test results.  You will be prompted to enter the 9-digit "MRN" number that appears at the top left of this page, followed by #.  Then you will hear the message. Please come back for a follow-up appointment in 6 months.

## 2011-04-16 ENCOUNTER — Other Ambulatory Visit: Payer: Self-pay | Admitting: Family Medicine

## 2011-04-16 MED ORDER — CITALOPRAM HYDROBROMIDE 20 MG PO TABS: 20.0000 mg | ORAL_TABLET | Freq: Every day | ORAL | Status: DC

## 2011-04-16 NOTE — Telephone Encounter (Signed)
Last seen 08/24/10 and filled 10/09/10 # 90 with 1 refill. Please advise    KP

## 2011-04-23 ENCOUNTER — Encounter: Payer: Self-pay | Admitting: Family Medicine

## 2011-04-23 ENCOUNTER — Ambulatory Visit (INDEPENDENT_AMBULATORY_CARE_PROVIDER_SITE_OTHER): Payer: Medicare Other | Admitting: Family Medicine

## 2011-04-23 VITALS — BP 118/70 | HR 76 | Temp 98.0°F | Ht 65.5 in | Wt 178.0 lb

## 2011-04-23 DIAGNOSIS — J3489 Other specified disorders of nose and nasal sinuses: Secondary | ICD-10-CM

## 2011-04-23 DIAGNOSIS — R059 Cough, unspecified: Secondary | ICD-10-CM

## 2011-04-23 DIAGNOSIS — J019 Acute sinusitis, unspecified: Secondary | ICD-10-CM

## 2011-04-23 DIAGNOSIS — R05 Cough: Secondary | ICD-10-CM

## 2011-04-23 MED ORDER — DOXYCYCLINE HYCLATE 100 MG PO TABS: 100.0000 mg | ORAL_TABLET | Freq: Two times a day (BID) | ORAL | Status: AC

## 2011-04-23 MED ORDER — BENZONATATE 200 MG PO CAPS: 200.0000 mg | ORAL_CAPSULE | Freq: Three times a day (TID) | ORAL | Status: AC | PRN

## 2011-04-23 NOTE — Progress Notes (Signed)
  Subjective:    Patient ID: Breanna Webster, female    DOB: 03-Aug-1945, 65 y.o.   MRN: [id number]  HPI Ear infxn- bilateral, dx'd in [place] on 12/28.  Started on abx ear drops.  Did not start any oral meds.  Since then, developed diarrhea.  Unable to breathe or swallow w/out mucinex or aleve.  Having thick green, blood tinged sputum from lungs and nose.  No fevers.  + facial pain.  + sick contacts.   Review of Systems For ROS see HPI     Objective:   Physical Exam  Vitals reviewed. Constitutional: She appears well-developed and well-nourished. No distress.  HENT:  Head: Normocephalic and atraumatic.  Right Ear: No drainage or swelling. No mastoid tenderness. Tympanic membrane is erythematous. Tympanic membrane is not injected and not bulging.  Left Ear: Tympanic membrane normal. No drainage or swelling. No mastoid tenderness. Tympanic membrane is not injected, not erythematous and not retracted.  Nose: Mucosal edema and rhinorrhea present. Right sinus exhibits maxillary sinus tenderness and frontal sinus tenderness. Left sinus exhibits maxillary sinus tenderness and frontal sinus tenderness.  Mouth/Throat: Uvula is midline and mucous membranes are normal. Posterior oropharyngeal erythema present. No oropharyngeal exudate.  Eyes: Conjunctivae and EOM are normal. Pupils are equal, round, and reactive to light.  Neck: Normal range of motion. Neck supple.  Cardiovascular: Normal rate, regular rhythm and normal heart sounds.   Pulmonary/Chest: Effort normal and breath sounds normal. No respiratory distress. She has no wheezes.       Occasional cough heard  Lymphadenopathy:    She has no cervical adenopathy.          Assessment & Plan:

## 2011-04-23 NOTE — Patient Instructions (Signed)
This is a sinus infection Take the Doxycycline as directed- take w/ food to avoid upset stomach Drink plenty of fluids REST! Alternate tylenol and ibuprofen every 4 hrs for pain/fever Continue the Mucinex to thin your chest congestion Call with any questions or concerns Hang in there!!!

## 2011-04-24 NOTE — Assessment & Plan Note (Signed)
Pt's sxs and PE consistent w/ infxn.  Start abx.  Reviewed supportive care and red flags that should prompt return.  Pt expressed understanding and is in agreement w/ plan.  

## 2011-05-03 ENCOUNTER — Telehealth: Payer: Self-pay | Admitting: *Deleted

## 2011-05-03 MED ORDER — PIOGLITAZONE HCL 15 MG PO TABS: 15.0000 mg | ORAL_TABLET | Freq: Every day | ORAL | Status: DC

## 2011-05-03 MED ORDER — LOSARTAN POTASSIUM 50 MG PO TABS: 50.0000 mg | ORAL_TABLET | Freq: Every day | ORAL | Status: DC

## 2011-05-03 NOTE — Telephone Encounter (Signed)
Pt left vm stating she needs refills for losarten and actos sent to pharmacy, sent via escribe and notified pt rx had been sent in

## 2011-05-14 ENCOUNTER — Other Ambulatory Visit: Payer: Self-pay | Admitting: Family Medicine

## 2011-05-14 MED ORDER — ATENOLOL 25 MG PO TABS: 25.0000 mg | ORAL_TABLET | Freq: Every day | ORAL | Status: DC

## 2011-05-14 NOTE — Telephone Encounter (Signed)
rx sent to pharmacy by e-script  

## 2011-05-16 ENCOUNTER — Other Ambulatory Visit: Payer: Self-pay | Admitting: Family Medicine

## 2011-05-16 MED ORDER — LEVOTHYROXINE SODIUM 150 MCG PO TABS: 150.0000 ug | ORAL_TABLET | Freq: Every day | ORAL | Status: DC

## 2011-05-16 NOTE — Telephone Encounter (Signed)
rx sent to pharmacy by e-script  

## 2011-06-05 ENCOUNTER — Other Ambulatory Visit: Payer: Self-pay | Admitting: Internal Medicine

## 2011-06-05 DIAGNOSIS — R300 Dysuria: Secondary | ICD-10-CM | POA: Diagnosis not present

## 2011-06-05 DIAGNOSIS — R3915 Urgency of urination: Secondary | ICD-10-CM | POA: Diagnosis not present

## 2011-06-05 DIAGNOSIS — R350 Frequency of micturition: Secondary | ICD-10-CM | POA: Diagnosis not present

## 2011-06-13 DIAGNOSIS — R300 Dysuria: Secondary | ICD-10-CM | POA: Diagnosis not present

## 2011-07-23 ENCOUNTER — Ambulatory Visit: Payer: Medicare Other | Admitting: Endocrinology

## 2011-07-24 DIAGNOSIS — R300 Dysuria: Secondary | ICD-10-CM | POA: Diagnosis not present

## 2011-07-24 DIAGNOSIS — R82998 Other abnormal findings in urine: Secondary | ICD-10-CM | POA: Diagnosis not present

## 2011-07-30 ENCOUNTER — Ambulatory Visit: Payer: Medicare Other | Admitting: Endocrinology

## 2011-08-07 DIAGNOSIS — N3020 Other chronic cystitis without hematuria: Secondary | ICD-10-CM | POA: Diagnosis not present

## 2011-08-10 ENCOUNTER — Telehealth: Payer: Self-pay | Admitting: Family Medicine

## 2011-08-10 MED ORDER — NORTRIPTYLINE HCL 25 MG PO CAPS: 25.0000 mg | ORAL_CAPSULE | Freq: Every day | ORAL | Status: DC

## 2011-08-10 NOTE — Telephone Encounter (Signed)
Refill: Nortriptyline hcl 25 mg cap #90. Take 1 capsule by mouth at bedtime. Last fill 05-14-11

## 2011-08-10 NOTE — Telephone Encounter (Signed)
Documentation below is inaccurate- wrong pt

## 2011-08-10 NOTE — Telephone Encounter (Signed)
Called and advised pt to that we can send her a RX for the Vicodin at #90 with no refills per in absence of MD Lowne this is the only amount we can send for her on Monday per notes coming into office every 2 weeks per accident, noted in chart with MD Tabori that pt has been in office on 07-31-11 and given Vicodin #90 with 0 refills and Soma #60 with 2 refills on 07-31-11, pt had left a vm requesting a call back about medication refills, pt understood that we will send Vicodin #90 to [facility] on Monday, pt understood

## 2011-08-14 ENCOUNTER — Other Ambulatory Visit (INDEPENDENT_AMBULATORY_CARE_PROVIDER_SITE_OTHER): Payer: Medicare Other

## 2011-08-14 ENCOUNTER — Ambulatory Visit (INDEPENDENT_AMBULATORY_CARE_PROVIDER_SITE_OTHER): Payer: Medicare Other | Admitting: Endocrinology

## 2011-08-14 ENCOUNTER — Encounter: Payer: Self-pay | Admitting: Endocrinology

## 2011-08-14 VITALS — BP 128/84 | HR 76 | Temp 97.6°F | Ht 65.5 in | Wt 179.0 lb

## 2011-08-14 DIAGNOSIS — E119 Type 2 diabetes mellitus without complications: Secondary | ICD-10-CM

## 2011-08-14 DIAGNOSIS — E785 Hyperlipidemia, unspecified: Secondary | ICD-10-CM | POA: Diagnosis not present

## 2011-08-14 DIAGNOSIS — K7689 Other specified diseases of liver: Secondary | ICD-10-CM

## 2011-08-14 LAB — LIPID PANEL
Cholesterol: 194 mg/dL (ref 0–200)
HDL: 50.3 mg/dL (ref >39.00)
LDL Cholesterol: 112 mg/dL — ABNORMAL HIGH (ref 0–99)
Total CHOL/HDL Ratio: 4
Triglycerides: 159 mg/dL — ABNORMAL HIGH (ref 0.0–149.0)
VLDL: 31.8 mg/dL (ref 0.0–40.0)

## 2011-08-14 LAB — HEPATIC FUNCTION PANEL
ALT: 25 U/L (ref 0–35)
AST: 23 U/L (ref 0–37)
Albumin: 4.4 g/dL (ref 3.5–5.2)
Alkaline Phosphatase: 64 U/L (ref 39–117)
Bilirubin, Direct: 0.1 mg/dL (ref 0.0–0.3)
Total Bilirubin: 0.5 mg/dL (ref 0.3–1.2)
Total Protein: 7.6 g/dL (ref 6.0–8.3)

## 2011-08-14 LAB — HEMOGLOBIN A1C: Hgb A1c MFr Bld: 6.2 % (ref 4.6–6.5)

## 2011-08-14 NOTE — Progress Notes (Signed)
Subjective:    Patient ID: Breanna Webster, female    DOB: 1945-08-31, 66 y.o.   MRN: [id number]  HPI The state of at least three ongoing medical problems is addressed today:   dyslipidemia: pt states she feels well in general.  She has lost weight, due to her efforts.    Type 2 DM (dx'ed 2423--NT known complications):   She checks cbg if she does not feel well, and it is well-controlled.   [name]:  Denies abd pain.   Past Medical History  Diagnosis Date  . Diabetes mellitus   . GERD (gastroesophageal reflux disease)   . Grave's disease   . Hyperlipidemia   . Osteopenia     Past Surgical History  Procedure Date  . Hysteroscopy     fibroids  . Laparoscopy     fibroids  . Cholecystectomy   . Hiatal hernia repair     History   Social History  . Marital Status: Married    Spouse Name: N/A    Number of Children: N/A  . Years of Education: N/A   Occupational History  . Not on file.   Social History Main Topics  . Smoking status: Never Smoker   . Smokeless tobacco: Not on file  . Alcohol Use: No  . Drug Use: No  . Sexually Active:    Other Topics Concern  . Not on file   Social History Narrative  . No narrative on file    Current Outpatient Prescriptions on File Prior to Visit  Medication Sig Dispense Refill  . aspirin 81 MG tablet Take 81 mg by mouth daily.        Marland Kitchen atenolol (TENORMIN) 25 MG tablet Take 1 tablet (25 mg total) by mouth daily.  90 tablet  1  . glucose blood (ACCU-CHEK COMFORT CURVE) test strip 1 each by Other route as directed. Use as instructed       . levothyroxine (SYNTHROID) 150 MCG tablet Take 1 tablet (150 mcg total) by mouth daily.  90 tablet  1  . LEXAPRO 20 MG tablet TAKE 1 AND 1/2 TABLET BY MOUTH ONCE DAILY  90 tablet  2  . losartan (COZAAR) 50 MG tablet Take 1 tablet (50 mg total) by mouth daily.  90 tablet  1  . nortriptyline (PAMELOR) 25 MG capsule Take 1 capsule (25 mg total) by mouth at bedtime.  90 capsule  1  . pioglitazone  (ACTOS) 15 MG tablet Take 1 tablet (15 mg total) by mouth daily.  90 tablet  1  . SitaGLIPtin-MetFORMIN HCl (JANUMET XR) 50-1000 MG TB24 Take 1 tablet by mouth 2 (two) times daily.  180 tablet  1  . citalopram (CELEXA) 20 MG tablet Take 1 tablet (20 mg total) by mouth daily. Please cancel rx for 40 mg just sent  90 tablet  1   No Known Allergies  Family History  Problem Relation Age of Onset  . Adopted: Yes    BP 128/84  Pulse 76  Temp(Src) 97.6 F (36.4 C) (Oral)  Ht 5' 5.5" (1.664 m)  Wt 179 lb (81.194 kg)  BMI 29.33 kg/m2  SpO2 98%  Review of Systems Denies weight change.  Pt says she has lightheadedness if a meal is delayed.    Objective:   Physical Exam VITAL SIGNS:  See vs page GENERAL: no distress Pulses: dorsalis pedis intact bilat.   Feet: no deformity.  no ulcer on the feet.  feet are of normal color and temp.  no edema Neuro: sensation is intact to touch on the feet  Lab Results  Component Value Date   WBC 5.7 08/24/2010   HGB 11.2* 08/24/2010   HCT 35.8* 08/24/2010   PLT 269.0 08/24/2010   GLUCOSE 137* 08/24/2010   CHOL 194 08/14/2011   TRIG 159.0* 08/14/2011   HDL 50.30 08/14/2011   LDLCALC 112* 08/14/2011   ALT 25 08/14/2011   AST 23 08/14/2011   NA 139 08/24/2010   K 5.1 08/24/2010   CL 103 08/24/2010   CREATININE 0.9 08/24/2010   BUN 19 08/24/2010   CO2 27 08/24/2010   TSH 0.41 01/22/2011   HGBA1C 6.2 08/14/2011   MICROALBUR 0.2 03/10/2009      Assessment & Plan:  Dyslipidemia, needs increased rx [place], well-controlled DM.  well-controlled

## 2011-08-14 NOTE — Patient Instructions (Signed)
blood tests are being requested for you today.  You will receive a letter with results.   Please come back for a follow-up appointment in 6 months.

## 2011-08-16 ENCOUNTER — Telehealth: Payer: Self-pay | Admitting: *Deleted

## 2011-08-16 NOTE — Telephone Encounter (Signed)
Pt informed of lab results. 

## 2011-08-16 NOTE — Telephone Encounter (Signed)
Called pt to inform of lab results, left message for pt to callback office (letter also mailed to pt). 

## 2011-09-11 ENCOUNTER — Telehealth: Payer: Self-pay | Admitting: Family Medicine

## 2011-09-11 NOTE — Telephone Encounter (Signed)
Pt made aware out of office until tomorrow. Please advise

## 2011-09-11 NOTE — Telephone Encounter (Signed)
Pt would like to know if Dr. Birdie Riddle thinks it is okay for to start taking Sensa. Pt states it is a natural appetite suppressant and info about it can be found on the internet.

## 2011-09-12 ENCOUNTER — Telehealth: Payer: Self-pay

## 2011-09-12 NOTE — Telephone Encounter (Signed)
.  left message to have patient return my call with husband per pt sleeping

## 2011-09-12 NOTE — Telephone Encounter (Signed)
Discuss with patient  

## 2011-09-12 NOTE — Telephone Encounter (Signed)
Pt called requesting MD's advisement on if it would be safe to take Sensa. Her PCP has given her an okay but advised she also check with ENDO. Please advise.

## 2011-09-12 NOTE — Telephone Encounter (Signed)
Ok with me 

## 2011-09-12 NOTE — Telephone Encounter (Signed)
This is fine w/ me but she should discuss this w/ Dr Loanne Drilling as well

## 2011-09-13 NOTE — Telephone Encounter (Signed)
Left message for pt to callback office.  

## 2011-09-14 NOTE — Telephone Encounter (Signed)
Pt's husband informed of advisement per pt.

## 2011-09-14 NOTE — Telephone Encounter (Signed)
Left message for pt to callback office.  

## 2011-09-18 ENCOUNTER — Encounter (HOSPITAL_COMMUNITY): Payer: Self-pay

## 2011-09-18 ENCOUNTER — Emergency Department (HOSPITAL_COMMUNITY): Payer: Medicare Other

## 2011-09-18 ENCOUNTER — Telehealth: Payer: Self-pay | Admitting: Family Medicine

## 2011-09-18 ENCOUNTER — Emergency Department (HOSPITAL_COMMUNITY)
Admission: EM | Admit: 2011-09-18 | Discharge: 2011-09-18 | Disposition: A | Discharge status: Home / Self Care | Payer: Medicare Other | Attending: Emergency Medicine | Admitting: Emergency Medicine

## 2011-09-18 ENCOUNTER — Telehealth: Payer: Self-pay | Admitting: Internal Medicine

## 2011-09-18 DIAGNOSIS — R1032 Left lower quadrant pain: Secondary | ICD-10-CM | POA: Diagnosis not present

## 2011-09-18 DIAGNOSIS — Z79899 Other long term (current) drug therapy: Secondary | ICD-10-CM | POA: Diagnosis not present

## 2011-09-18 DIAGNOSIS — R109 Unspecified abdominal pain: Secondary | ICD-10-CM | POA: Diagnosis not present

## 2011-09-18 DIAGNOSIS — E785 Hyperlipidemia, unspecified: Secondary | ICD-10-CM | POA: Diagnosis not present

## 2011-09-18 DIAGNOSIS — E119 Type 2 diabetes mellitus without complications: Secondary | ICD-10-CM | POA: Diagnosis not present

## 2011-09-18 DIAGNOSIS — E0500 Thyrotoxicosis with diffuse goiter without thyrotoxic crisis or storm: Secondary | ICD-10-CM | POA: Insufficient documentation

## 2011-09-18 DIAGNOSIS — Z7982 Long term (current) use of aspirin: Secondary | ICD-10-CM | POA: Insufficient documentation

## 2011-09-18 DIAGNOSIS — K219 Gastro-esophageal reflux disease without esophagitis: Secondary | ICD-10-CM | POA: Insufficient documentation

## 2011-09-18 LAB — URINALYSIS, ROUTINE W REFLEX MICROSCOPIC
Bilirubin Urine: NEGATIVE
Glucose, UA: NEGATIVE mg/dL
Hgb urine dipstick: NEGATIVE
Ketones, ur: NEGATIVE mg/dL
Nitrite: NEGATIVE
Protein, ur: NEGATIVE mg/dL
Specific Gravity, Urine: 1.015 (ref 1.005–1.030)
Urobilinogen, UA: 0.2 mg/dL (ref 0.0–1.0)
pH: 5.5 (ref 5.0–8.0)

## 2011-09-18 LAB — COMPREHENSIVE METABOLIC PANEL
ALT: 20 U/L (ref 0–35)
AST: 22 U/L (ref 0–37)
Albumin: 4 g/dL (ref 3.5–5.2)
Alkaline Phosphatase: 66 U/L (ref 39–117)
BUN: 14 mg/dL (ref 6–23)
CO2: 23 mEq/L (ref 19–32)
Calcium: 9.6 mg/dL (ref 8.4–10.5)
Chloride: 98 mEq/L (ref 96–112)
Creatinine, Ser: 0.57 mg/dL (ref 0.50–1.10)
GFR calc Af Amer: >90 mL/min (ref >90)
GFR calc non Af Amer: >90 mL/min (ref >90)
Glucose, Bld: 122 mg/dL — ABNORMAL HIGH (ref 70–99)
Potassium: 4 mEq/L (ref 3.5–5.1)
Sodium: 134 mEq/L — ABNORMAL LOW (ref 135–145)
Total Bilirubin: 0.4 mg/dL (ref 0.3–1.2)
Total Protein: 7.3 g/dL (ref 6.0–8.3)

## 2011-09-18 LAB — DIFFERENTIAL
Basophils Absolute: 0.1 10*3/uL (ref 0.0–0.1)
Basophils Relative: 1 % (ref 0–1)
Eosinophils Absolute: 0.3 10*3/uL (ref 0.0–0.7)
Eosinophils Relative: 3 % (ref 0–5)
Lymphocytes Relative: 33 % (ref 12–46)
Lymphs Abs: 2.9 10*3/uL (ref 0.7–4.0)
Monocytes Absolute: 0.6 10*3/uL (ref 0.1–1.0)
Monocytes Relative: 7 % (ref 3–12)
Neutro Abs: 4.8 10*3/uL (ref 1.7–7.7)
Neutrophils Relative %: 56 % (ref 43–77)

## 2011-09-18 LAB — CBC
HCT: 36.4 % (ref 36.0–46.0)
Hemoglobin: 11.5 g/dL — ABNORMAL LOW (ref 12.0–15.0)
MCH: 19.9 pg — ABNORMAL LOW (ref 26.0–34.0)
MCHC: 31.6 g/dL (ref 30.0–36.0)
MCV: 62.9 fL — ABNORMAL LOW (ref 78.0–100.0)
Platelets: 285 10*3/uL (ref 150–400)
RBC: 5.79 MIL/uL — ABNORMAL HIGH (ref 3.87–5.11)
RDW: 15.6 % — ABNORMAL HIGH (ref 11.5–15.5)
WBC: 8.7 10*3/uL (ref 4.0–10.5)

## 2011-09-18 LAB — URINE MICROSCOPIC-ADD ON

## 2011-09-18 LAB — LIPASE, BLOOD: Lipase: 15 U/L (ref 11–59)

## 2011-09-18 MED ORDER — OXYCODONE-ACETAMINOPHEN 5-325 MG PO TABS: 2.0000 | ORAL_TABLET | ORAL | Status: AC | PRN

## 2011-09-18 MED ORDER — SODIUM CHLORIDE 0.9 % IV SOLN
Freq: Once | INTRAVENOUS | Status: AC
  Administered 2011-09-18: 21:00:00 via INTRAVENOUS

## 2011-09-18 MED ORDER — IOHEXOL 300 MG/ML  SOLN
100.0000 mL | Freq: Once | INTRAMUSCULAR | Status: AC | PRN
  Administered 2011-09-18: 100 mL via INTRAVENOUS

## 2011-09-18 MED ORDER — ONDANSETRON HCL 4 MG/2ML IJ SOLN: 4.0000 mg | Freq: Once | INTRAMUSCULAR | Status: DC

## 2011-09-18 MED ORDER — DICYCLOMINE HCL 20 MG PO TABS: 20.0000 mg | ORAL_TABLET | Freq: Two times a day (BID) | ORAL | Status: DC

## 2011-09-18 MED ORDER — OXYCODONE-ACETAMINOPHEN 5-325 MG PO TABS: 2.0000 | ORAL_TABLET | ORAL | Status: DC | PRN

## 2011-09-18 NOTE — Telephone Encounter (Signed)
Phone call from the answering service: 66 year old lady with diabetes with 2 days history of steady left sided chest pain, 7/10, was recommended to 911, she refused so the nurse is asking for my opinion. The patient likes an appointment for tomorrow.  My advice is: Call 911 now, an appointment for tomorrow is not appropriate  If patient refuses, at least have a family member drive her to the ER. Also recommend her to call us in the morning and let us know how she is doing.

## 2011-09-18 NOTE — Discharge Instructions (Signed)

## 2011-09-18 NOTE — Telephone Encounter (Signed)
Caller: Breanna Webster/Patient; PCP: Midge Minium.; CB#: [phone];  Call regarding Abdominal Pain;  Onset: 09/16/11.  Afebrile.  Constant dull, pain is under breast bone, worse on left but all across upper abdomen. Pain rated 7/10.  Denies nausea, diaphoresis.  Diabetic; does not test blood sugar.  Advised to call 911 now for pressure or pain anywhere in chest lasting 5 minute or longer now or within the last hr per Chest Pain Guideline.  Refused to call 911; requests appt for 09/19/11.  RN called MD on call; Dr Larose Kells ordered to call 911; if refused 911, to have another adult drive to ED.  May call office for appt 09/19/11 after ED evaluation. Pt stated she is "not going to the hospital" and not going to sit around in the ED when she is not having "chest pain"; feels pain in upper abdomen under the breast bone is abdominal pain.

## 2011-09-18 NOTE — ED Provider Notes (Signed)
History     CSN: [id number]  Arrival date & time 09/18/11  1759   First MD Initiated Contact with Patient 09/18/11 2027      Chief Complaint  Patient presents with  . Abdominal Pain    (Consider location/radiation/quality/duration/timing/severity/associated sxs/prior treatment) Patient is a 66 y.o. female presenting with abdominal pain. The history is provided by the patient.  Abdominal Pain The primary symptoms of the illness include abdominal pain.   patient here with left upper and lower quadrant abdominal pain x2 days. Pain is described as crampy in nature. It is been constant and made better or worse nothing. No associated fever, vomiting, black or bloody stools. No prior history of same. No medications taken for this prior to arrival. History of cholecystectomy  Past Medical History  Diagnosis Date  . Diabetes mellitus   . GERD (gastroesophageal reflux disease)   . Grave's disease   . Hyperlipidemia   . Osteopenia     Past Surgical History  Procedure Date  . Hysteroscopy     fibroids  . Laparoscopy     fibroids  . Cholecystectomy   . Hiatal hernia repair     Family History  Problem Relation Age of Onset  . Adopted: Yes    History  Substance Use Topics  . Smoking status: Never Smoker   . Smokeless tobacco: Not on file  . Alcohol Use: No    OB History    Grav Para Term Preterm Abortions TAB SAB Ect Mult Living                  Review of Systems  Gastrointestinal: Positive for abdominal pain.  All other systems reviewed and are negative.    Allergies  Review of patient's allergies indicates no known allergies.  Home Medications   Current Outpatient Rx  Name Route Sig Dispense Refill  . ASPIRIN 81 MG PO TABS Oral Take 81 mg by mouth daily.      . ATENOLOL 25 MG PO TABS Oral Take 1 tablet (25 mg total) by mouth daily. 90 tablet 1  . CITALOPRAM HYDROBROMIDE 20 MG PO TABS Oral Take 1 tablet (20 mg total) by mouth daily. Please cancel rx for 40 mg  just sent 90 tablet 1  . GLUCOSE BLOOD VI STRP Other 1 each by Other route as directed. Use as instructed     . LEVOTHYROXINE SODIUM 150 MCG PO TABS Oral Take 1 tablet (150 mcg total) by mouth daily. 90 tablet 1  . LEXAPRO 20 MG PO TABS  TAKE 1 AND 1/2 TABLET BY MOUTH ONCE DAILY 90 tablet 2  . LOSARTAN POTASSIUM 50 MG PO TABS Oral Take 1 tablet (50 mg total) by mouth daily. 90 tablet 1  . NORTRIPTYLINE HCL 25 MG PO CAPS Oral Take 1 capsule (25 mg total) by mouth at bedtime. 90 capsule 1  . PIOGLITAZONE HCL 15 MG PO TABS Oral Take 1 tablet (15 mg total) by mouth daily. 90 tablet 1  . SITAGLIPTIN-METFORMIN HCL ER 50-1000 MG PO TB24 Oral Take 1 tablet by mouth 2 (two) times daily. 180 tablet 1    BP 128/80  Pulse 77  Temp(Src) 98.7 F (37.1 C) (Oral)  Resp 20  Wt 180 lb (81.647 kg)  SpO2 97%  Physical Exam  Nursing note and vitals reviewed. Constitutional: She is oriented to person, place, and time. Vital signs are normal. She appears well-developed and well-nourished.  Non-toxic appearance. No distress.  HENT:  Head: Normocephalic and atraumatic.  Eyes: Conjunctivae, EOM and lids are normal. Pupils are equal, round, and reactive to light.  Neck: Normal range of motion. Neck supple. No tracheal deviation present. No mass present.  Cardiovascular: Normal rate, regular rhythm and normal heart sounds.  Exam reveals no gallop.   No murmur heard. Pulmonary/Chest: Effort normal and breath sounds normal. No stridor. No respiratory distress. She has no decreased breath sounds. She has no wheezes. She has no rhonchi. She has no rales.  Abdominal: Soft. Normal appearance and bowel sounds are normal. She exhibits no distension. There is tenderness in the left upper quadrant and left lower quadrant. There is no rigidity, no rebound, no guarding and no CVA tenderness.  Musculoskeletal: Normal range of motion. She exhibits no edema and no tenderness.  Neurological: She is alert and oriented to person,  place, and time. She has normal strength. No cranial nerve deficit or sensory deficit. GCS eye subscore is 4. GCS verbal subscore is 5. GCS motor subscore is 6.  Skin: Skin is warm and dry. No abrasion and no rash noted.  Psychiatric: She has a normal mood and affect. Her speech is normal and behavior is normal.    ED Course  Procedures (including critical care time)   Labs Reviewed  CBC  DIFFERENTIAL  COMPREHENSIVE METABOLIC PANEL  LIPASE, BLOOD  URINALYSIS, ROUTINE W REFLEX MICROSCOPIC  URINE CULTURE   No results found.   No diagnosis found.    MDM  Abdominal CT results noted and are negative. Patient's abdomen reexamined prior to discharge and remains nonsurgical. Will place her medications and she will see her doctor later this week        Leota Jacobsen, MD 09/18/11 [phone]

## 2011-09-18 NOTE — ED Notes (Signed)
Pt was told by primary to come to the ED for evaluation, she complains of left upper abd pain that radiates to the middle of her back, no vomiting or diarrhea, pt no longer has gallbladder

## 2011-09-19 ENCOUNTER — Telehealth: Payer: Self-pay

## 2011-09-19 NOTE — Telephone Encounter (Signed)
Noted pt was seen in ED on 09-18-11 per instructions

## 2011-09-19 NOTE — Telephone Encounter (Signed)
Noted.  Agree w/ ER

## 2011-09-19 NOTE — Telephone Encounter (Signed)
Pt informed of MD's advisement.

## 2011-09-19 NOTE — Telephone Encounter (Signed)
Pt called stating she has CT scan last night and was advised to [place] for 48 hrs. Pt is requesting Md advisement on when to restart medication and any special instruction while off of medicine. Please advise.

## 2011-09-19 NOTE — Telephone Encounter (Signed)
Left message for pt to callback office.  

## 2011-09-19 NOTE — Telephone Encounter (Signed)
Ok to resume in 2 days

## 2011-09-21 LAB — URINE CULTURE
Colony Count: >=100000
Culture  Setup Time: 201305290229

## 2011-09-22 NOTE — ED Notes (Signed)
+  Urine. Chart sent to [place] office for review.

## 2011-09-23 NOTE — ED Notes (Signed)
Chart reviewed by Clayton Bibles PA "Give Bactrim DS 1 pill po BID x 3 days.  Follow up with PCP."  6/2 Left message w/husband for pt to return call

## 2011-09-23 NOTE — ED Notes (Signed)
Spoke w/pt .  Informed of dx and need for addl tx.  Pt states she has Rx for Trimeth/Sulfa from Urology and will get that filled.  Per results sens. to Trimeth/Sulfa.  Results sent to Dr Jimmey Ralph at [facility].

## 2011-11-12 ENCOUNTER — Telehealth: Payer: Self-pay | Admitting: Family Medicine

## 2011-11-12 MED ORDER — ATENOLOL 25 MG PO TABS: 25.0000 mg | ORAL_TABLET | Freq: Every day | ORAL | Status: DC

## 2011-11-12 MED ORDER — LEVOTHYROXINE SODIUM 150 MCG PO TABS: 150.0000 ug | ORAL_TABLET | Freq: Every day | ORAL | Status: DC

## 2011-11-12 NOTE — Telephone Encounter (Signed)
rx sent to pharmacy by e-script Placed instructions for synthroid DAW

## 2011-11-12 NOTE — Telephone Encounter (Signed)
Refill: Synthroid 150 mcg tablet. Take 1 tablet by mouth once daily. Qty 90. Last fill 08/11/11. *Substitution not allowed-brand medication mandated by law*  Atenolol 30m tablet. Take 1 tablet by mouth once daily. Qty 90. Last fill 08-11-11.

## 2011-11-27 ENCOUNTER — Telehealth: Payer: Self-pay | Admitting: Family Medicine

## 2011-11-27 ENCOUNTER — Telehealth: Payer: Self-pay

## 2011-11-27 ENCOUNTER — Other Ambulatory Visit: Payer: Self-pay | Admitting: Endocrinology

## 2011-11-27 MED ORDER — LOSARTAN POTASSIUM 50 MG PO TABS: 50.0000 mg | ORAL_TABLET | Freq: Every day | ORAL | Status: DC

## 2011-11-27 NOTE — Telephone Encounter (Signed)
Refill done.  

## 2011-11-27 NOTE — Telephone Encounter (Signed)
This is a rare side-effect, if it is true at all.  i would have no hesitation to take these meds.

## 2011-11-27 NOTE — Telephone Encounter (Signed)
Refill: Losartan 11m #90. Take one tablet by mouth daily. Last fill 10-15-11

## 2011-11-27 NOTE — Telephone Encounter (Signed)
Pt called to inform MD that she is concerned about taking Janumet and Actos based on recent studies linking these medications to different kinds of cancer. Pt is requesting MD advisement on whether she should continue taking these medicines, please advise.

## 2011-11-27 NOTE — Telephone Encounter (Signed)
Pt informed of MD's advisement.

## 2011-11-28 ENCOUNTER — Ambulatory Visit (INDEPENDENT_AMBULATORY_CARE_PROVIDER_SITE_OTHER): Payer: Medicare Other | Admitting: Family Medicine

## 2011-11-28 ENCOUNTER — Encounter: Payer: Self-pay | Admitting: Family Medicine

## 2011-11-28 VITALS — BP 142/86 | HR 86 | Temp 98.0°F | Ht 64.5 in | Wt 188.6 lb

## 2011-11-28 DIAGNOSIS — I10 Essential (primary) hypertension: Secondary | ICD-10-CM | POA: Diagnosis not present

## 2011-11-28 MED ORDER — LOSARTAN POTASSIUM 100 MG PO TABS: 100.0000 mg | ORAL_TABLET | Freq: Every day | ORAL | Status: DC

## 2011-11-28 NOTE — Progress Notes (Signed)
  Subjective:    Patient ID: Breanna Webster, female    DOB: 1946/03/04, 66 y.o.   MRN: [id number]  HPI HTN- deteriorated.  First noted 2 days ago at [facility].  Denies CP, SOB, HAs, visual changes, edema.  On atenolol and cozaar.  Has gained 10 lbs recently.  + stress.   Review of Systems For ROS see HPI     Objective:   Physical Exam  Vitals reviewed. Constitutional: She is oriented to person, place, and time. She appears well-developed and well-nourished. No distress.  HENT:  Head: Normocephalic and atraumatic.  Eyes: Conjunctivae and EOM are normal. Pupils are equal, round, and reactive to light.  Neck: Normal range of motion. Neck supple. No thyromegaly present.  Cardiovascular: Normal rate, regular rhythm and intact distal pulses.   Murmur (II/VI SEM) heard. Pulmonary/Chest: Effort normal and breath sounds normal. No respiratory distress.  Abdominal: Soft. She exhibits no distension. There is no tenderness.  Musculoskeletal: She exhibits no edema.  Lymphadenopathy:    She has no cervical adenopathy.  Neurological: She is alert and oriented to person, place, and time.  Skin: Skin is warm and dry.  Psychiatric: She has a normal mood and affect. Her behavior is normal.          Assessment & Plan:

## 2011-11-28 NOTE — Patient Instructions (Addendum)
Schedule your complete physical for September Increase the Losartan to 174m daily Keep up the good work- you can lose this weight! Call with any questions or concerns Happy Bel[employer] Birthday!!!

## 2011-11-28 NOTE — Assessment & Plan Note (Signed)
Deteriorated but asymptomatic.  Increase Losartan to 144m.  Monitor closely.  Will get BMP at upcoming CPE.  Reviewed supportive care and red flags that should prompt return.  Pt expressed understanding and is in agreement w/ plan.

## 2011-11-29 ENCOUNTER — Telehealth: Payer: Self-pay | Admitting: Family Medicine

## 2011-11-29 ENCOUNTER — Encounter: Payer: Medicare Other | Admitting: Family Medicine

## 2011-11-29 MED ORDER — PIOGLITAZONE HCL 15 MG PO TABS: 15.0000 mg | ORAL_TABLET | Freq: Every day | ORAL | Status: DC

## 2011-11-29 NOTE — Telephone Encounter (Signed)
rx sent to pharmacy by e-script per pt has upcoming apt

## 2011-11-29 NOTE — Telephone Encounter (Signed)
Refill: Pioglitazone hcl 15m tablet. Take 1 tablet by mouth once daily. Qty 90. Last fill 08-30-11

## 2011-12-11 DIAGNOSIS — N281 Cyst of kidney, acquired: Secondary | ICD-10-CM | POA: Diagnosis not present

## 2011-12-11 DIAGNOSIS — D3500 Benign neoplasm of unspecified adrenal gland: Secondary | ICD-10-CM | POA: Diagnosis not present

## 2011-12-11 DIAGNOSIS — N3020 Other chronic cystitis without hematuria: Secondary | ICD-10-CM | POA: Diagnosis not present

## 2012-01-11 ENCOUNTER — Other Ambulatory Visit: Payer: Self-pay | Admitting: Family Medicine

## 2012-01-11 ENCOUNTER — Other Ambulatory Visit: Payer: Self-pay | Admitting: Internal Medicine

## 2012-01-11 DIAGNOSIS — Z1231 Encounter for screening mammogram for malignant neoplasm of breast: Secondary | ICD-10-CM

## 2012-01-16 ENCOUNTER — Encounter: Payer: Self-pay | Admitting: Family Medicine

## 2012-01-16 ENCOUNTER — Ambulatory Visit (INDEPENDENT_AMBULATORY_CARE_PROVIDER_SITE_OTHER): Payer: Medicare Other | Admitting: Family Medicine

## 2012-01-16 VITALS — BP 134/84 | HR 78 | Temp 98.8°F | Ht 65.0 in | Wt 190.4 lb

## 2012-01-16 DIAGNOSIS — E039 Hypothyroidism, unspecified: Secondary | ICD-10-CM | POA: Diagnosis not present

## 2012-01-16 DIAGNOSIS — R5383 Other fatigue: Secondary | ICD-10-CM | POA: Diagnosis not present

## 2012-01-16 DIAGNOSIS — R5381 Other malaise: Secondary | ICD-10-CM

## 2012-01-16 DIAGNOSIS — E559 Vitamin D deficiency, unspecified: Secondary | ICD-10-CM

## 2012-01-16 DIAGNOSIS — E785 Hyperlipidemia, unspecified: Secondary | ICD-10-CM

## 2012-01-16 DIAGNOSIS — Z23 Encounter for immunization: Secondary | ICD-10-CM

## 2012-01-16 DIAGNOSIS — I10 Essential (primary) hypertension: Secondary | ICD-10-CM | POA: Diagnosis not present

## 2012-01-16 DIAGNOSIS — Z0000 Encounter for general adult medical examination without abnormal findings: Secondary | ICD-10-CM

## 2012-01-16 NOTE — Patient Instructions (Addendum)
Follow up in 6 months to recheck BP and cholesterol We'll notify you of your lab results Schedule a nurse visit in 1 week for pneumonia shot Call your insurance to check on the shingles Call with any questions or concerns Happy fall!!

## 2012-01-16 NOTE — Progress Notes (Signed)
  Subjective:    Patient ID: Breanna Webster, female    DOB: [birth year], 66 y.o.   MRN: [id number]  HPI Here today for CPE.  Risk Factors: Hyperlipidemia- chronic problem, intolerant to statins. HTN- chronic problem, better control since increasing Losartan.  No CP, SOB, HAs, visual changes, edema. Hypothyroid- chronic problem, on Synthroid 150 mcg.  Reports constant fatigue which is unusual for her.  Has hx of thalassemia minor and is intolerant to iron.  Sleeping well at night.  Will take up to 5 hr naps during the day. Physical Activity: walking regularly Fall Risk: very low risk, steady on feet Depression: denies current sxs Hearing: normal to whispered voice and conversational tones at 6 ft ADL's: independent Cognitive: normal linear thought process, memory and attention intact Home Safety: feeling safe at home, lives w/ husband Height, Weight, BMI, Visual Acuity: see vitals, vision corrected to 20/20 w/ glasses Counseling: UTD on colonoscopy, has mammo scheduled, GYN upcoming.  Plans on scheduling pneumovax in 1 week. Labs Ordered: See A&P Care Plan: See A&P    Review of Systems Patient reports no vision/ hearing changes, adenopathy,fever, weight change,  persistant/recurrent hoarseness , swallowing issues, chest pain, palpitations, edema, persistant/recurrent cough, hemoptysis, dyspnea (rest/exertional/paroxysmal nocturnal), gastrointestinal bleeding (melena, rectal bleeding), abdominal pain, significant heartburn, bowel changes, GU symptoms (dysuria, hematuria, incontinence), Gyn symptoms (abnormal  bleeding, pain),  syncope, focal weakness, memory loss, numbness & tingling, skin/hair/nail changes, abnormal bruising or bleeding, anxiety, or depression.     Objective:   Physical Exam General Appearance:    Alert, cooperative, no distress, appears stated age  Head:    Normocephalic, without obvious abnormality, atraumatic  Eyes:    PERRL, conjunctiva/corneas clear, EOM's intact,  fundi    benign, both eyes  Ears:    Normal TM's and external ear canals, both ears  Nose:   Nares normal, septum midline, mucosa normal, no drainage    or sinus tenderness  Throat:   Lips, mucosa, and tongue normal; teeth and gums normal  Neck:   Supple, symmetrical, trachea midline, no adenopathy;    Thyroid: no enlargement/tenderness/nodules  Back:     Symmetric, no curvature, ROM normal, no CVA tenderness  Lungs:     Clear to auscultation bilaterally, respirations unlabored  Chest Wall:    No tenderness or deformity   Heart:    Regular rate and rhythm, S1 and S2 normal, no murmur, rub   or gallop  Breast Exam:    Deferred to GYN  Abdomen:     Soft, non-tender, bowel sounds active all four quadrants,    no masses, no organomegaly  Genitalia:    Deferred to GYN  Rectal:    Extremities:   Extremities normal, atraumatic, no cyanosis or edema  Pulses:   2+ and symmetric all extremities  Skin:   Skin color, texture, turgor normal, no rashes or lesions  Lymph nodes:   Cervical, supraclavicular, and axillary nodes normal  Neurologic:   CNII-XII intact, normal strength, sensation and reflexes    throughout          Assessment & Plan:

## 2012-01-17 ENCOUNTER — Telehealth: Payer: Self-pay | Admitting: *Deleted

## 2012-01-17 LAB — CBC WITH DIFFERENTIAL/PLATELET
Basophils Absolute: 0.1 10*3/uL (ref 0.0–0.1)
Basophils Relative: 1.1 % (ref 0.0–3.0)
Eosinophils Absolute: 0.2 10*3/uL (ref 0.0–0.7)
Eosinophils Relative: 2.7 % (ref 0.0–5.0)
HCT: 35.6 % — ABNORMAL LOW (ref 36.0–46.0)
Hemoglobin: 10.9 g/dL — ABNORMAL LOW (ref 12.0–15.0)
Lymphocytes Relative: 22.5 % (ref 12.0–46.0)
Lymphs Abs: 1.6 10*3/uL (ref 0.7–4.0)
MCHC: 30.7 g/dL (ref 30.0–36.0)
MCV: 65.4 fl — ABNORMAL LOW (ref 78.0–100.0)
Monocytes Absolute: 0.4 10*3/uL (ref 0.1–1.0)
Monocytes Relative: 5.7 % (ref 3.0–12.0)
Neutro Abs: 4.9 10*3/uL (ref 1.4–7.7)
Neutrophils Relative %: 68 % (ref 43.0–77.0)
Platelets: 254 10*3/uL (ref 150.0–400.0)
RBC: 5.44 Mil/uL — ABNORMAL HIGH (ref 3.87–5.11)
RDW: 15.3 % — ABNORMAL HIGH (ref 11.5–14.6)
WBC: 7.3 10*3/uL (ref 4.5–10.5)

## 2012-01-17 LAB — BASIC METABOLIC PANEL
BUN: 17 mg/dL (ref 6–23)
CO2: 24 mEq/L (ref 19–32)
Calcium: 9.5 mg/dL (ref 8.4–10.5)
Chloride: 102 mEq/L (ref 96–112)
Creatinine, Ser: 0.7 mg/dL (ref 0.4–1.2)
GFR: 92.98 mL/min (ref >60.00)
Glucose, Bld: 76 mg/dL (ref 70–99)
Potassium: 4.3 mEq/L (ref 3.5–5.1)
Sodium: 136 mEq/L (ref 135–145)

## 2012-01-17 LAB — LIPID PANEL
Cholesterol: 183 mg/dL (ref 0–200)
HDL: 42.2 mg/dL (ref >39.00)
LDL Cholesterol: 114 mg/dL — ABNORMAL HIGH (ref 0–99)
Total CHOL/HDL Ratio: 4
Triglycerides: 132 mg/dL (ref 0.0–149.0)
VLDL: 26.4 mg/dL (ref 0.0–40.0)

## 2012-01-17 LAB — HEPATIC FUNCTION PANEL
ALT: 29 U/L (ref 0–35)
AST: 30 U/L (ref 0–37)
Albumin: 4.3 g/dL (ref 3.5–5.2)
Alkaline Phosphatase: 63 U/L (ref 39–117)
Bilirubin, Direct: 0.1 mg/dL (ref 0.0–0.3)
Total Bilirubin: 0.7 mg/dL (ref 0.3–1.2)
Total Protein: 7.5 g/dL (ref 6.0–8.3)

## 2012-01-17 LAB — TSH: TSH: 0.75 u[IU]/mL (ref 0.35–5.50)

## 2012-01-17 NOTE — Telephone Encounter (Signed)
Returned pt call, noted spoke to scheduler after OV to ask if MD Birdie Riddle needs to schedule her Dexa Scan, pt home vm has not been set up yet, left vm on pt mobile, per MD Tabori noted she advised pt during OV that she will need to have her GYN schedule this apt for her, per pt stated that she is planning to establish with GYN soon

## 2012-01-18 NOTE — Telephone Encounter (Signed)
Called pt mobile 3 times and noted sound of a pick up then a hang up, unable to make contact on home number, will call again monday

## 2012-01-19 LAB — VITAMIN D 1,25 DIHYDROXY
Vitamin D 1, 25 (OH)2 Total: 41 pg/mL (ref 18–72)
Vitamin D2 1, 25 (OH)2: <8 pg/mL
Vitamin D3 1, 25 (OH)2: 41 pg/mL

## 2012-01-20 DIAGNOSIS — Z0000 Encounter for general adult medical examination without abnormal findings: Secondary | ICD-10-CM | POA: Insufficient documentation

## 2012-01-20 NOTE — Assessment & Plan Note (Signed)
Chronic problem, adequate control.  Asymptomatic.  No changes.

## 2012-01-20 NOTE — Assessment & Plan Note (Signed)
Chronic problem.  Check labs.  Adjust meds prn.

## 2012-01-20 NOTE — Assessment & Plan Note (Signed)
New.  Etiology unclear- check thyroid to r/o abnormality or inadequate synthroid dosing, CBC to r/o anemia.  Will follow.

## 2012-01-20 NOTE — Assessment & Plan Note (Signed)
Chronic problem.  Intolerant to statins and currently not treated.  Have discussed Zetia and Welchol but pt has declined.  Depending on lab results may need to revisit issue.  Will follow closely.

## 2012-01-20 NOTE — Assessment & Plan Note (Signed)
Chronic problem.  Check labs.  Replete prn. 

## 2012-01-20 NOTE — Assessment & Plan Note (Signed)
Physical exam WNL w/ exception of obesity.  UTD on preventative maintenance.  Check labs.  Anticipatory guidance provided.

## 2012-01-21 ENCOUNTER — Telehealth: Payer: Self-pay | Admitting: Family Medicine

## 2012-01-21 DIAGNOSIS — Z299 Encounter for prophylactic measures, unspecified: Secondary | ICD-10-CM

## 2012-01-21 MED ORDER — ZOSTER VACCINE LIVE 19400 UNT/0.65ML ~~LOC~~ SOLR: 0.6500 mL | Freq: Once | SUBCUTANEOUS | Status: DC

## 2012-01-21 NOTE — Telephone Encounter (Signed)
Pt advised that this office will fax the Rx for the zostavax on 02-15-12, per will be 30 days after she received the flu shot, pt understood

## 2012-01-21 NOTE — Telephone Encounter (Signed)
ZOSTAVAX RX  SENT TO [id number] PH# [phone], FAX [phone]

## 2012-01-21 NOTE — Telephone Encounter (Signed)
Pt advised that she is getting the Pneumonia shot next week and wants to know if she needs to wait longer after the PNA shot before receiving the shingles shot?

## 2012-01-21 NOTE — Telephone Encounter (Signed)
Pt returned call to advise that she did get an apt set up for GYN soon and will discuss the Dexa SCan with that GYN MD

## 2012-01-21 NOTE — Telephone Encounter (Signed)
pt called for 2-reasons 1-she was given the wrong # to [facility] wanted the correct # & needs lab results I did go online and look up the phone # for OB GYN gave it to patient she wrote down, advised labs have not been resulted yet will get a call once they have been Cb# home [phone], cell [phone]

## 2012-01-21 NOTE — Telephone Encounter (Signed)
Can have shingles shot at or around same time as pneumonia shot

## 2012-01-22 ENCOUNTER — Ambulatory Visit (HOSPITAL_COMMUNITY)
Admission: RE | Admit: 2012-01-22 | Discharge: 2012-01-22 | Disposition: A | Discharge status: Home / Self Care | Payer: Medicare Other | Source: Ambulatory Visit | Attending: Family Medicine | Admitting: Family Medicine

## 2012-01-22 DIAGNOSIS — Z1231 Encounter for screening mammogram for malignant neoplasm of breast: Secondary | ICD-10-CM | POA: Diagnosis not present

## 2012-01-24 ENCOUNTER — Ambulatory Visit: Payer: Medicare Other

## 2012-01-28 ENCOUNTER — Telehealth: Payer: Self-pay | Admitting: Family Medicine

## 2012-01-28 ENCOUNTER — Ambulatory Visit (INDEPENDENT_AMBULATORY_CARE_PROVIDER_SITE_OTHER): Payer: Medicare Other | Admitting: *Deleted

## 2012-01-28 DIAGNOSIS — Z299 Encounter for prophylactic measures, unspecified: Secondary | ICD-10-CM | POA: Diagnosis not present

## 2012-01-28 DIAGNOSIS — Z23 Encounter for immunization: Secondary | ICD-10-CM

## 2012-01-28 MED ORDER — NORTRIPTYLINE HCL 25 MG PO CAPS: 25.0000 mg | ORAL_CAPSULE | Freq: Every day | ORAL | Status: DC

## 2012-01-28 NOTE — Telephone Encounter (Signed)
rx sent to pharmacy by e-script  

## 2012-01-28 NOTE — Telephone Encounter (Signed)
Refill: Nortriptyline hcl 25 mg cap. Take 1 capsule by mouth at bedtime. Qty 90. Last fill 7.22.13

## 2012-01-31 ENCOUNTER — Ambulatory Visit (INDEPENDENT_AMBULATORY_CARE_PROVIDER_SITE_OTHER): Payer: Medicare Other | Admitting: Endocrinology

## 2012-01-31 ENCOUNTER — Encounter: Payer: Self-pay | Admitting: Endocrinology

## 2012-01-31 VITALS — BP 126/76 | HR 65 | Temp 98.0°F | Wt 188.0 lb

## 2012-01-31 DIAGNOSIS — E119 Type 2 diabetes mellitus without complications: Secondary | ICD-10-CM

## 2012-01-31 DIAGNOSIS — D649 Anemia, unspecified: Secondary | ICD-10-CM | POA: Diagnosis not present

## 2012-01-31 LAB — HEMOGLOBIN A1C
Hgb A1c MFr Bld: 6.1 % — ABNORMAL HIGH (ref <5.7)
Mean Plasma Glucose: 128 mg/dL — ABNORMAL HIGH (ref <117)

## 2012-01-31 MED ORDER — ESCITALOPRAM OXALATE 20 MG PO TABS: 20.0000 mg | ORAL_TABLET | Freq: Every day | ORAL | Status: DC

## 2012-01-31 NOTE — Progress Notes (Signed)
Subjective:    Patient ID: Breanna Webster, female    DOB: [birth year], 66 y.o.   MRN: [id number]  HPI Pt returns for f/u of type 2 DM (dx'ed 1308--MV known complications).  She says cbg's are well-controlled. Pt reports a few mos of slight swelling of the legs, but no assoc sob. Past Medical History  Diagnosis Date  . Diabetes mellitus   . GERD (gastroesophageal reflux disease)   . Grave's disease   . Hyperlipidemia   . Osteopenia   . Hypertension     Past Surgical History  Procedure Date  . Hysteroscopy     fibroids  . Laparoscopy     fibroids  . Cholecystectomy   . Hiatal hernia repair     History   Social History  . Marital Status: Married    Spouse Name: N/A    Number of Children: N/A  . Years of Education: N/A   Occupational History  . Not on file.   Social History Main Topics  . Smoking status: Never Smoker   . Smokeless tobacco: Not on file  . Alcohol Use: No  . Drug Use: No  . Sexually Active:    Other Topics Concern  . Not on file   Social History Narrative  . No narrative on file    Current Outpatient Prescriptions on File Prior to Visit  Medication Sig Dispense Refill  . aspirin 81 MG tablet Take 81 mg by mouth daily.        Marland Kitchen atenolol (TENORMIN) 25 MG tablet Take 1 tablet (25 mg total) by mouth daily.  90 tablet  1  . dicyclomine (BENTYL) 20 MG tablet Take 1 tablet (20 mg total) by mouth 2 (two) times daily.  20 tablet  0  . glucose blood (ACCU-CHEK COMFORT CURVE) test strip 1 each by Other route as directed. Use as instructed       . JANUMET XR 50-1000 MG TB24 take 1 tablet by mouth twice a day  60 tablet  5  . levothyroxine (SYNTHROID) 150 MCG tablet Take 1 tablet (150 mcg total) by mouth daily.  90 tablet  1  . Multiple Vitamin (MULITIVITAMIN WITH MINERALS) TABS Take 1 tablet by mouth daily.      . nortriptyline (PAMELOR) 25 MG capsule Take 1 capsule (25 mg total) by mouth at bedtime.  90 capsule  1  . trimethoprim (TRIMPEX) 100 MG tablet        . zoster vaccine live, PF, (ZOSTAVAX) 78469 UNT/0.65ML injection Inject 19,400 Units into the skin once.  1 each  0  . DISCONTD: escitalopram (LEXAPRO) 20 MG tablet Take 20 mg by mouth daily.      Marland Kitchen DISCONTD: losartan (COZAAR) 100 MG tablet Take 1 tablet (100 mg total) by mouth daily.  30 tablet  3  . DISCONTD: pioglitazone (ACTOS) 15 MG tablet Take 1 tablet (15 mg total) by mouth daily.  90 tablet  0    No Known Allergies  Family History  Problem Relation Age of Onset  . Adopted: Yes    BP 126/76  Pulse 65  Temp 98 F (36.7 C) (Oral)  Wt 188 lb (85.276 kg)  Review of Systems denies hypoglycemia, but she reports weight gain    Objective:   Physical Exam VITAL SIGNS:  See vs page GENERAL: no distress Pulses: dorsalis pedis intact bilat.   Feet: no deformity.  no ulcer on the feet.  feet are of normal color and temp.  no edema Neuro:  sensation is intact to touch on the feet.      Assessment & Plan:  Slight edema, possibly due to actos NASH:  She needs actos for this DM, apparently well-controlled

## 2012-01-31 NOTE — Patient Instructions (Addendum)
blood tests are being requested for you today.  You will receive a letter with results.   Please come back for a follow-up appointment in 6 months. Reduce the actos to 1/2 pill daily.

## 2012-02-11 DIAGNOSIS — Z124 Encounter for screening for malignant neoplasm of cervix: Secondary | ICD-10-CM | POA: Diagnosis not present

## 2012-02-11 DIAGNOSIS — M949 Disorder of cartilage, unspecified: Secondary | ICD-10-CM | POA: Diagnosis not present

## 2012-02-11 DIAGNOSIS — M899 Disorder of bone, unspecified: Secondary | ICD-10-CM | POA: Diagnosis not present

## 2012-02-11 DIAGNOSIS — Z01419 Encounter for gynecological examination (general) (routine) without abnormal findings: Secondary | ICD-10-CM | POA: Diagnosis not present

## 2012-02-12 ENCOUNTER — Other Ambulatory Visit (HOSPITAL_COMMUNITY): Payer: Self-pay | Admitting: Obstetrics

## 2012-02-12 DIAGNOSIS — M8580 Other specified disorders of bone density and structure, unspecified site: Secondary | ICD-10-CM

## 2012-02-14 ENCOUNTER — Ambulatory Visit: Payer: Medicare Other | Admitting: Endocrinology

## 2012-02-18 ENCOUNTER — Other Ambulatory Visit: Payer: Self-pay | Admitting: Family Medicine

## 2012-02-18 ENCOUNTER — Ambulatory Visit (HOSPITAL_COMMUNITY): Payer: Medicare Other

## 2012-02-18 NOTE — Telephone Encounter (Signed)
refill Escitalopram 20MG tablet #90-wt/2-refills Take 1 and 1/2 tablet by mouth once --last fill 9.27.13--last ov 9.25.13 V70---NOTE LAST FILL IN EPIC MEDS LISTING WAS 10.10.13 BY DR. Loanne Drilling

## 2012-02-18 NOTE — Telephone Encounter (Signed)
Noted filled by Loanne Drilling for 100 tablets with 3 refills on 01-31-12 to [facility], incoming fax from [facility] on [address], declined per MD Tabori verbal order per years worth sent to [facility]

## 2012-02-25 ENCOUNTER — Telehealth: Payer: Self-pay | Admitting: Endocrinology

## 2012-02-25 NOTE — Telephone Encounter (Signed)
Pt called and would like to come by and get a few samples (didn't state which ones). She is out and about and would like someone to give her a call.Breanna Webster

## 2012-02-26 NOTE — Telephone Encounter (Signed)
Pt coming in to see dr. Loanne Drilling on Wednesday

## 2012-02-26 NOTE — Telephone Encounter (Signed)
Pt stated she needed samples of janumet 50/1000mg and will call us back next month to see if we have samples

## 2012-03-04 ENCOUNTER — Other Ambulatory Visit: Payer: Self-pay | Admitting: Family Medicine

## 2012-03-04 NOTE — Telephone Encounter (Signed)
Left message to call office. Need to verify which pharmacy Pt is using and if she need med now since years supply she in to another pharmacy.

## 2012-03-04 NOTE — Telephone Encounter (Signed)
refills escitalopram 44m tablet #90--wt/2-refills Take 1  & 1/2 tablet by mouth once --last fill per fax 9.27.13 see phone note dated 10.28.13

## 2012-03-05 NOTE — Telephone Encounter (Signed)
Discuss with patient informed Pt that Rx was sent in to pharmacy by Dr Loanne Drilling on 01-31-12. Instructed Pt to contact pharmacy in reference to refill because they should have it on file. Pt verbalized understanding and state that she will contact pharmacy.

## 2012-03-05 NOTE — Telephone Encounter (Signed)
Pt called [facility] triage returning your call Plz advise    MW

## 2012-03-05 NOTE — Telephone Encounter (Signed)
Left message to call office

## 2012-03-07 ENCOUNTER — Ambulatory Visit (INDEPENDENT_AMBULATORY_CARE_PROVIDER_SITE_OTHER): Payer: Medicare Other | Admitting: Family Medicine

## 2012-03-07 ENCOUNTER — Encounter: Payer: Self-pay | Admitting: Family Medicine

## 2012-03-07 VITALS — BP 130/78 | HR 74 | Temp 98.8°F | Wt 189.0 lb

## 2012-03-07 DIAGNOSIS — G5790 Unspecified mononeuropathy of unspecified lower limb: Secondary | ICD-10-CM | POA: Diagnosis not present

## 2012-03-07 NOTE — Progress Notes (Signed)
  Subjective:    Patient ID: Breanna Webster, female    DOB: [birth year], 66 y.o.   MRN: [id number]  HPI Burning in legs- bilateral lower legs, painful to touch or put pressure on.  Described as a 'burning, like w/ sand paper'.  No similar sxs in thighs.  Not having similar sxs in feet.  sxs are not worse at night.  No urge to move legs.   Review of Systems For ROS see HPI     Objective:   Physical Exam  Vitals reviewed. Constitutional: She is oriented to person, place, and time. She appears well-developed and well-nourished. No distress.  Neurological: She is alert and oriented to person, place, and time. She has normal strength and normal reflexes. She displays no tremor. No cranial nerve deficit or sensory deficit. She exhibits normal muscle tone. Coordination and gait normal.       + burning sensation of palpation of legs bilaterally, no burning w/ light touch  Skin: Skin is warm and dry. No rash noted. No erythema.          Assessment & Plan:

## 2012-03-07 NOTE — Patient Instructions (Addendum)
We'll notify you of your neurology appt At this time, it appears to be neuropathy and there is nothing you need to do other than continue to have good sugar control Call with any questions or concerns Hang in there!!! Happy Thanksgiving!!!

## 2012-03-07 NOTE — Assessment & Plan Note (Signed)
New.  Pt's sxs consistent w/ neuropathy.  Offered gabapentin but pt declines.  Doesn't want to take meds w/out definitive dx.  Will refer to neuro for likely nerve conduction test.  Pt expressed understanding and is in agreement w/ plan.

## 2012-03-12 ENCOUNTER — Telehealth: Payer: Self-pay | Admitting: Gastroenterology

## 2012-03-12 NOTE — Telephone Encounter (Signed)
Pt had colon done in 2005 at [facility], . Report is scanned into epic. OB/GYN office calling wanting to know when pt is due for her next colon. Please advise.

## 2012-03-13 NOTE — Telephone Encounter (Signed)
Spoke with Seth Bake and gave her Dr. Kelby Fam recommendation.

## 2012-03-13 NOTE — Telephone Encounter (Signed)
2015  

## 2012-03-18 ENCOUNTER — Other Ambulatory Visit: Payer: Self-pay | Admitting: Family Medicine

## 2012-03-18 MED ORDER — PIOGLITAZONE HCL 15 MG PO TABS: 15.0000 mg | ORAL_TABLET | Freq: Every day | ORAL | Status: DC

## 2012-03-18 NOTE — Telephone Encounter (Signed)
refill Pioglitazone HCl (Tab) 15 MG Take 1-tablet by mouth once daily #90, last fill 08.08.13,  last ov 11.15.13 acute

## 2012-03-18 NOTE — Telephone Encounter (Signed)
Please refill prn 

## 2012-03-28 DIAGNOSIS — E119 Type 2 diabetes mellitus without complications: Secondary | ICD-10-CM | POA: Diagnosis not present

## 2012-03-28 DIAGNOSIS — R209 Unspecified disturbances of skin sensation: Secondary | ICD-10-CM | POA: Diagnosis not present

## 2012-04-04 ENCOUNTER — Telehealth: Payer: Self-pay | Admitting: Family Medicine

## 2012-04-04 DIAGNOSIS — I10 Essential (primary) hypertension: Secondary | ICD-10-CM

## 2012-04-04 MED ORDER — LOSARTAN POTASSIUM 100 MG PO TABS: 150.0000 mg | ORAL_TABLET | Freq: Every day | ORAL | Status: DC

## 2012-04-04 NOTE — Telephone Encounter (Signed)
Refill for Losartan sent to pharmacy.

## 2012-04-04 NOTE — Telephone Encounter (Signed)
refill Losartan Potassium (Tab) 100 MG Take 150 mg by mouth daily. #30 wt3-refills last fill 11.11.13-last ov 11.15.13 acute--labs 9.25.13

## 2012-04-10 ENCOUNTER — Telehealth: Payer: Self-pay | Admitting: Family Medicine

## 2012-04-10 NOTE — Telephone Encounter (Signed)
Refill- escitalopram 78m tablet. Take 1 and 1/2 tablet by mouth once daily. Qty 90 last fill 9.27.13

## 2012-04-11 ENCOUNTER — Other Ambulatory Visit: Payer: Self-pay | Admitting: *Deleted

## 2012-04-11 DIAGNOSIS — F32A Depression, unspecified: Secondary | ICD-10-CM

## 2012-04-11 DIAGNOSIS — F329 Major depressive disorder, single episode, unspecified: Secondary | ICD-10-CM

## 2012-04-11 MED ORDER — ESCITALOPRAM OXALATE 20 MG PO TABS: 20.0000 mg | ORAL_TABLET | Freq: Every day | ORAL | Status: DC

## 2012-04-11 NOTE — Telephone Encounter (Signed)
Refill for lexapro sent to pharmacy

## 2012-04-11 NOTE — Telephone Encounter (Signed)
Please advise on RF request.//AB/CMA

## 2012-04-13 NOTE — Telephone Encounter (Signed)
[place] for #90, 3 refills

## 2012-04-30 ENCOUNTER — Telehealth: Payer: Self-pay | Admitting: Family Medicine

## 2012-04-30 MED ORDER — ATENOLOL 25 MG PO TABS: 25.0000 mg | ORAL_TABLET | Freq: Every day | ORAL | Status: DC

## 2012-04-30 MED ORDER — LEVOTHYROXINE SODIUM 150 MCG PO TABS: 150.0000 ug | ORAL_TABLET | Freq: Every day | ORAL | Status: DC

## 2012-04-30 NOTE — Telephone Encounter (Signed)
Refill: Atenolol 25 mg tablet. Take 1 tablet by mouth once daily. Qty 90. Last fill 01-30-12 Refill: Synthroid 150 mcg tablet. Take 1 tablet by mouth once daily. Qty 90. Last fill 01-30-12. *substitution allowed - patient requested product dispensed*

## 2012-06-09 ENCOUNTER — Other Ambulatory Visit: Payer: Self-pay | Admitting: Endocrinology

## 2012-06-09 ENCOUNTER — Telehealth: Payer: Self-pay | Admitting: Family Medicine

## 2012-06-09 DIAGNOSIS — E559 Vitamin D deficiency, unspecified: Secondary | ICD-10-CM | POA: Diagnosis not present

## 2012-06-09 NOTE — Telephone Encounter (Signed)
refill Nortriptyline HCl (Cap) 25 MG Take 1 capsule (25 mg total) by mouth at bedtime #90 wt1-refill last fill 1.10.14

## 2012-06-10 ENCOUNTER — Other Ambulatory Visit: Payer: Self-pay | Admitting: *Deleted

## 2012-06-10 DIAGNOSIS — F411 Generalized anxiety disorder: Secondary | ICD-10-CM

## 2012-06-10 MED ORDER — NORTRIPTYLINE HCL 25 MG PO CAPS: 25.0000 mg | ORAL_CAPSULE | Freq: Every day | ORAL | Status: DC

## 2012-06-10 NOTE — Telephone Encounter (Signed)
Script for Nortriptyline printed and placed on ledge for MD signature

## 2012-06-10 NOTE — Telephone Encounter (Signed)
Please advise on RF request.  No recent OV for depression-but an appt scheduled for 07-17-12.//AB/CMA

## 2012-06-10 NOTE — Telephone Encounter (Signed)
[place] for #90, 1 refill

## 2012-06-12 NOTE — Telephone Encounter (Signed)
Message from RX indicated rx was printed and placed on ledge for MD

## 2012-06-30 ENCOUNTER — Encounter: Payer: Self-pay | Admitting: Endocrinology

## 2012-06-30 ENCOUNTER — Ambulatory Visit (INDEPENDENT_AMBULATORY_CARE_PROVIDER_SITE_OTHER): Payer: Medicare Other | Admitting: Endocrinology

## 2012-06-30 VITALS — BP 126/84 | HR 84 | Wt 190.0 lb

## 2012-06-30 DIAGNOSIS — E119 Type 2 diabetes mellitus without complications: Secondary | ICD-10-CM | POA: Diagnosis not present

## 2012-06-30 LAB — MICROALBUMIN / CREATININE URINE RATIO
Creatinine,U: 61.6 mg/dL
Microalb Creat Ratio: 0.6 mg/g (ref 0.0–30.0)
Microalb, Ur: 0.4 mg/dL (ref 0.0–1.9)

## 2012-06-30 LAB — HEMOGLOBIN A1C: Hgb A1c MFr Bld: 6.1 % (ref 4.6–6.5)

## 2012-06-30 NOTE — Progress Notes (Signed)
  Subjective:    Patient ID: Breanna Webster, female    DOB: [birth year], 67 y.o.   MRN: [id number]  HPI Pt returns for f/u of type 2 DM (dx'ed 1655--VZ known complications).  She says cbg's are well-controlled. pt states she feels well in general, except for weight gain. Past Medical History  Diagnosis Date  . Diabetes mellitus   . GERD (gastroesophageal reflux disease)   . Grave's disease   . Hyperlipidemia   . Osteopenia   . Hypertension     Past Surgical History  Procedure Laterality Date  . Hysteroscopy      fibroids  . Laparoscopy      fibroids  . Cholecystectomy    . Hiatal hernia repair      History   Social History  . Marital Status: Married    Spouse Name: N/A    Number of Children: N/A  . Years of Education: N/A   Occupational History  . Not on file.   Social History Main Topics  . Smoking status: Never Smoker   . Smokeless tobacco: Not on file  . Alcohol Use: No  . Drug Use: No  . Sexually Active:    Other Topics Concern  . Not on file   Social History Narrative  . No narrative on file    Current Outpatient Prescriptions on File Prior to Visit  Medication Sig Dispense Refill  . aspirin 81 MG tablet Take 81 mg by mouth daily.        Marland Kitchen atenolol (TENORMIN) 25 MG tablet Take 1 tablet (25 mg total) by mouth daily.  90 tablet  1  . escitalopram (LEXAPRO) 20 MG tablet Take 1 tablet (20 mg total) by mouth daily.  100 tablet  3  . glucose blood (ACCU-CHEK COMFORT CURVE) test strip 1 each by Other route as directed. Use as instructed       . JANUMET XR 50-1000 MG TB24 take 1 tablet by mouth twice a day  60 tablet  5  . levothyroxine (SYNTHROID) 150 MCG tablet Take 1 tablet (150 mcg total) by mouth daily.  90 tablet  1  . losartan (COZAAR) 100 MG tablet Take 1.5 tablets (150 mg total) by mouth daily.  30 tablet  3  . Multiple Vitamin (MULITIVITAMIN WITH MINERALS) TABS Take 1 tablet by mouth daily.      . nortriptyline (PAMELOR) 25 MG capsule Take 1 capsule  (25 mg total) by mouth at bedtime.  90 capsule  1  . zoster vaccine live, PF, (ZOSTAVAX) 48270 UNT/0.65ML injection Inject 19,400 Units into the skin once.  1 each  0   No current facility-administered medications on file prior to visit.    No Known Allergies  Family History  Problem Relation Age of Onset  . Adopted: Yes    BP 126/84  Pulse 84  Wt 190 lb (86.183 kg)  BMI 31.62 kg/m2  SpO2 96%  Review of Systems Denies edema    Objective:   Physical Exam Pulses: dorsalis pedis intact bilat.   Feet: no deformity.  no ulcer on the feet.  feet are of normal color and temp.  no edema Neuro: sensation is intact to touch on the feet  Lab Results  Component Value Date   HGBA1C 6.1 06/30/2012      Assessment & Plan:  DM; well-controlled

## 2012-06-30 NOTE — Patient Instructions (Addendum)
Blood and urine tests are being requested for you today.  We'll contact you with results.   Please come back for a follow-up appointment in 6 months.

## 2012-07-17 ENCOUNTER — Ambulatory Visit: Payer: Medicare Other | Admitting: Family Medicine

## 2012-07-23 ENCOUNTER — Ambulatory Visit (INDEPENDENT_AMBULATORY_CARE_PROVIDER_SITE_OTHER): Payer: Medicare Other | Admitting: Family Medicine

## 2012-07-23 ENCOUNTER — Encounter: Payer: Self-pay | Admitting: Family Medicine

## 2012-07-23 VITALS — BP 140/60 | HR 76 | Temp 98.3°F | Ht 65.0 in | Wt 186.0 lb

## 2012-07-23 DIAGNOSIS — I10 Essential (primary) hypertension: Secondary | ICD-10-CM

## 2012-07-23 DIAGNOSIS — E785 Hyperlipidemia, unspecified: Secondary | ICD-10-CM

## 2012-07-23 NOTE — Assessment & Plan Note (Signed)
Chronic problem.  Mildly elevated today.  Recent Endo visit showed good BP.  Will not change meds but encouraged pt to periodically check home BP and call if consistently >140/90.  Is asymptomatic today.  Pt expressed understanding and is in agreement w/ plan.

## 2012-07-23 NOTE — Patient Instructions (Addendum)
Schedule your complete physical in 6 months We'll notify you of your lab results and make any changes if needed Keep up the good work! Call with any questions or concerns Happy Spring!

## 2012-07-23 NOTE — Progress Notes (Signed)
  Subjective:    Patient ID: Breanna Webster, female    DOB: 08-05-1945, 67 y.o.   MRN: [id number]  HPI HTN- chronic problem, slightly elevated today.  Was well controlled at Endo visit.  Pt reports BP is labile.  On Losartan and atenolol.  No CP, SOB, HAs, visual changes, edema.  Hyperlipidemia- chronic problem, intolerant to statins and zetia.  Attempting to control w/ diet and exercise.   Review of Systems For ROS see HPI     Objective:   Physical Exam  Vitals reviewed. Constitutional: She is oriented to person, place, and time. She appears well-developed and well-nourished. No distress.  HENT:  Head: Normocephalic and atraumatic.  Eyes: Conjunctivae and EOM are normal. Pupils are equal, round, and reactive to light.  Neck: Normal range of motion. Neck supple. No thyromegaly present.  Cardiovascular: Normal rate, regular rhythm and intact distal pulses.   Murmur (II-III/VI SEM) heard. Pulmonary/Chest: Effort normal and breath sounds normal. No respiratory distress.  Abdominal: Soft. She exhibits no distension. There is no tenderness.  Musculoskeletal: She exhibits no edema.  Lymphadenopathy:    She has no cervical adenopathy.  Neurological: She is alert and oriented to person, place, and time.  Skin: Skin is warm and dry.  Psychiatric: She has a normal mood and affect. Her behavior is normal.          Assessment & Plan:

## 2012-07-23 NOTE — Assessment & Plan Note (Signed)
Chronic problem.  Has been intolerant to previous meds.  Due to dx of DM, may consider welchol if LDL is again elevated.  Will follow closely.

## 2012-07-24 LAB — BASIC METABOLIC PANEL
BUN: 15 mg/dL (ref 6–23)
CO2: 27 mEq/L (ref 19–32)
Calcium: 9.4 mg/dL (ref 8.4–10.5)
Chloride: 101 mEq/L (ref 96–112)
Creatinine, Ser: 0.7 mg/dL (ref 0.4–1.2)
GFR: 86.83 mL/min (ref >60.00)
Glucose, Bld: 137 mg/dL — ABNORMAL HIGH (ref 70–99)
Potassium: 3.9 mEq/L (ref 3.5–5.1)
Sodium: 135 mEq/L (ref 135–145)

## 2012-07-24 LAB — HEPATIC FUNCTION PANEL
ALT: 27 U/L (ref 0–35)
AST: 26 U/L (ref 0–37)
Albumin: 4.1 g/dL (ref 3.5–5.2)
Alkaline Phosphatase: 68 U/L (ref 39–117)
Bilirubin, Direct: 0.2 mg/dL (ref 0.0–0.3)
Total Bilirubin: 0.6 mg/dL (ref 0.3–1.2)
Total Protein: 7.4 g/dL (ref 6.0–8.3)

## 2012-07-24 LAB — LIPID PANEL
Cholesterol: 169 mg/dL (ref 0–200)
HDL: 37.6 mg/dL — ABNORMAL LOW (ref >39.00)
LDL Cholesterol: 96 mg/dL (ref 0–99)
Total CHOL/HDL Ratio: 4
Triglycerides: 175 mg/dL — ABNORMAL HIGH (ref 0.0–149.0)
VLDL: 35 mg/dL (ref 0.0–40.0)

## 2012-08-20 ENCOUNTER — Other Ambulatory Visit: Payer: Self-pay | Admitting: Family Medicine

## 2012-09-03 DIAGNOSIS — E1139 Type 2 diabetes mellitus with other diabetic ophthalmic complication: Secondary | ICD-10-CM | POA: Diagnosis not present

## 2012-09-29 DIAGNOSIS — L821 Other seborrheic keratosis: Secondary | ICD-10-CM | POA: Diagnosis not present

## 2012-09-29 DIAGNOSIS — D239 Other benign neoplasm of skin, unspecified: Secondary | ICD-10-CM | POA: Diagnosis not present

## 2012-09-29 DIAGNOSIS — I781 Nevus, non-neoplastic: Secondary | ICD-10-CM | POA: Diagnosis not present

## 2012-10-24 ENCOUNTER — Other Ambulatory Visit: Payer: Self-pay | Admitting: Family Medicine

## 2012-11-20 ENCOUNTER — Encounter: Payer: Self-pay | Admitting: Family Medicine

## 2012-11-20 ENCOUNTER — Ambulatory Visit: Payer: Medicare Other | Admitting: Family Medicine

## 2012-11-20 ENCOUNTER — Ambulatory Visit (INDEPENDENT_AMBULATORY_CARE_PROVIDER_SITE_OTHER): Payer: Medicare Other | Admitting: Family Medicine

## 2012-11-20 VITALS — BP 140/78 | HR 76 | Temp 98.6°F | Ht 65.0 in | Wt 185.8 lb

## 2012-11-20 DIAGNOSIS — J209 Acute bronchitis, unspecified: Secondary | ICD-10-CM | POA: Diagnosis not present

## 2012-11-20 MED ORDER — BENZONATATE 200 MG PO CAPS: 200.0000 mg | ORAL_CAPSULE | Freq: Three times a day (TID) | ORAL | Status: DC | PRN

## 2012-11-20 MED ORDER — PROMETHAZINE-DM 6.25-15 MG/5ML PO SYRP: 5.0000 mL | ORAL_SOLUTION | Freq: Four times a day (QID) | ORAL | Status: DC | PRN

## 2012-11-20 MED ORDER — AZITHROMYCIN 250 MG PO TABS: ORAL_TABLET | ORAL | Status: DC

## 2012-11-20 NOTE — Progress Notes (Signed)
  Subjective:    Patient ID: Breanna Webster, female    DOB: 03-07-1946, 67 y.o.   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  Increased fatigue recently.  + cough- unable to catch breath.  sxs started 3 weeks ago.  Productive cough.  No fevers.  No facial pain/pressure.  No ear pain.  + chest congestion.  No N/V/D.  No known sick contacts but sxs started while husband was in the hospital.   Review of Systems For ROS see HPI     Objective:   Physical Exam  Vitals reviewed. Constitutional: She appears well-developed and well-nourished. No distress.  HENT:  Head: Normocephalic and atraumatic.  TMs normal bilaterally Mild nasal congestion Throat w/out erythema, edema, or exudate  Eyes: Conjunctivae and EOM are normal. Pupils are equal, round, and reactive to light.  Neck: Normal range of motion. Neck supple.  Cardiovascular: Normal rate, regular rhythm, normal heart sounds and intact distal pulses.   No murmur heard. Pulmonary/Chest: Effort normal and breath sounds normal. No respiratory distress. She has no wheezes.  + hacking cough  Lymphadenopathy:    She has no cervical adenopathy.          Assessment & Plan:

## 2012-11-20 NOTE — Patient Instructions (Addendum)
Go to [facility] and get your xray Start the Zpack as directed Take the cough syrup (will cause drowsiness) and pills (won't) as needed Drink plenty of fluids REST! Hang in there!

## 2012-11-21 ENCOUNTER — Ambulatory Visit (INDEPENDENT_AMBULATORY_CARE_PROVIDER_SITE_OTHER)
Admission: RE | Admit: 2012-11-21 | Discharge: 2012-11-21 | Disposition: A | Discharge status: Home / Self Care | Payer: Medicare Other | Source: Ambulatory Visit | Attending: Family Medicine | Admitting: Family Medicine

## 2012-11-21 DIAGNOSIS — J209 Acute bronchitis, unspecified: Secondary | ICD-10-CM | POA: Diagnosis not present

## 2012-11-21 DIAGNOSIS — R05 Cough: Secondary | ICD-10-CM | POA: Diagnosis not present

## 2012-11-22 NOTE — Assessment & Plan Note (Addendum)
New to provider.  Pt's cough is severe and duration of 3 weeks is concerning for possible bacterial process- particularly in someone who's diabetic.  Start abx.  Cough meds prn.  Get CXR.  Reviewed supportive care and red flags that should prompt return.  Pt expressed understanding and is in agreement w/ plan.

## 2012-12-29 ENCOUNTER — Ambulatory Visit (INDEPENDENT_AMBULATORY_CARE_PROVIDER_SITE_OTHER): Payer: Medicare Other | Admitting: Endocrinology

## 2012-12-29 ENCOUNTER — Encounter: Payer: Self-pay | Admitting: Endocrinology

## 2012-12-29 VITALS — BP 130/80 | HR 78 | Ht 65.0 in | Wt 185.0 lb

## 2012-12-29 DIAGNOSIS — Z23 Encounter for immunization: Secondary | ICD-10-CM | POA: Diagnosis not present

## 2012-12-29 DIAGNOSIS — E119 Type 2 diabetes mellitus without complications: Secondary | ICD-10-CM

## 2012-12-29 LAB — HEMOGLOBIN A1C: Hgb A1c MFr Bld: 6.2 % (ref 4.6–6.5)

## 2012-12-29 NOTE — Patient Instructions (Addendum)
A diabetes blood test are being requested for you today.  We'll contact you with results.   Please come back for a follow-up appointment in 6 months.

## 2012-12-29 NOTE — Progress Notes (Signed)
  Subjective:    Patient ID: Breanna Webster, female    DOB: [birth year], 67 y.o.   MRN: [id number]  HPI Pt returns for f/u of type 2 DM (dx'ed 1855--MZ known complications).  She says cbg's are well-controlled. pt states she feels well in general. Past Medical History  Diagnosis Date  . Diabetes mellitus   . GERD (gastroesophageal reflux disease)   . Grave's disease   . Hyperlipidemia   . Osteopenia   . Hypertension     Past Surgical History  Procedure Laterality Date  . Hysteroscopy      fibroids  . Laparoscopy      fibroids  . Cholecystectomy    . Hiatal hernia repair      History   Social History  . Marital Status: Married    Spouse Name: N/A    Number of Children: N/A  . Years of Education: N/A   Occupational History  . Not on file.   Social History Main Topics  . Smoking status: Never Smoker   . Smokeless tobacco: Not on file  . Alcohol Use: No  . Drug Use: No  . Sexual Activity:    Other Topics Concern  . Not on file   Social History Narrative  . No narrative on file    Current Outpatient Prescriptions on File Prior to Visit  Medication Sig Dispense Refill  . aspirin 81 MG tablet Take 81 mg by mouth daily.        Marland Kitchen atenolol (TENORMIN) 25 MG tablet take 1 tablet by mouth once daily  90 tablet  0  . escitalopram (LEXAPRO) 20 MG tablet Take 1 tablet (20 mg total) by mouth daily.  100 tablet  3  . glucose blood (ACCU-CHEK COMFORT CURVE) test strip 1 each by Other route as directed. Use as instructed       . JANUMET XR 50-1000 MG TB24 take 1 tablet by mouth twice a day  60 tablet  5  . losartan (COZAAR) 100 MG tablet Take 100 mg by mouth daily.      . Multiple Vitamin (MULITIVITAMIN WITH MINERALS) TABS Take 1 tablet by mouth daily.      . nortriptyline (PAMELOR) 25 MG capsule Take 1 capsule (25 mg total) by mouth at bedtime.  90 capsule  1  . pioglitazone (ACTOS) 15 MG tablet Take 15 mg by mouth every other day.      Marland Kitchen SYNTHROID 150 MCG tablet take 1  tablet by mouth once daily  90 tablet  0   No current facility-administered medications on file prior to visit.    No Known Allergies  Family History  Problem Relation Age of Onset  . Adopted: Yes    BP 130/80  Pulse 78  Ht 5' 5"  (1.651 m)  Wt 185 lb (83.915 kg)  BMI 30.79 kg/m2  SpO2 98%  Review of Systems She denies n/v    Objective:   Physical Exam VITAL SIGNS:  See vs page GENERAL: no distress   Lab Results  Component Value Date   HGBA1C 6.2 12/29/2012       Assessment & Plan:  DM: well-controlled

## 2012-12-31 DIAGNOSIS — N281 Cyst of kidney, acquired: Secondary | ICD-10-CM | POA: Diagnosis not present

## 2012-12-31 DIAGNOSIS — N3020 Other chronic cystitis without hematuria: Secondary | ICD-10-CM | POA: Diagnosis not present

## 2013-01-21 ENCOUNTER — Telehealth: Payer: Self-pay

## 2013-01-21 NOTE — Telephone Encounter (Signed)
Patient sees endo for DM. Had recent labs (in chart) Meds reconciled, allergies and pharmacy verified

## 2013-01-21 NOTE — Telephone Encounter (Signed)
LM with spouse for CB  HM UTD WE T-dap and 2nd PNA (if applicable)

## 2013-01-22 ENCOUNTER — Ambulatory Visit (INDEPENDENT_AMBULATORY_CARE_PROVIDER_SITE_OTHER): Payer: Medicare Other | Admitting: Family Medicine

## 2013-01-22 ENCOUNTER — Encounter: Payer: Self-pay | Admitting: Family Medicine

## 2013-01-22 VITALS — BP 130/84 | HR 64 | Temp 99.5°F | Resp 16 | Ht 65.75 in | Wt 183.5 lb

## 2013-01-22 DIAGNOSIS — E785 Hyperlipidemia, unspecified: Secondary | ICD-10-CM | POA: Diagnosis not present

## 2013-01-22 DIAGNOSIS — Z0000 Encounter for general adult medical examination without abnormal findings: Secondary | ICD-10-CM | POA: Diagnosis not present

## 2013-01-22 DIAGNOSIS — E559 Vitamin D deficiency, unspecified: Secondary | ICD-10-CM

## 2013-01-22 DIAGNOSIS — E039 Hypothyroidism, unspecified: Secondary | ICD-10-CM

## 2013-01-22 DIAGNOSIS — I10 Essential (primary) hypertension: Secondary | ICD-10-CM | POA: Diagnosis not present

## 2013-01-22 LAB — CBC WITH DIFFERENTIAL/PLATELET
Basophils Absolute: 0 10*3/uL (ref 0.0–0.1)
Basophils Relative: 0.4 % (ref 0.0–3.0)
Eosinophils Absolute: 0.1 10*3/uL (ref 0.0–0.7)
Eosinophils Relative: 1.3 % (ref 0.0–5.0)
HCT: 37.1 % (ref 36.0–46.0)
Hemoglobin: 11.9 g/dL — ABNORMAL LOW (ref 12.0–15.0)
Lymphocytes Relative: 18.4 % (ref 12.0–46.0)
Lymphs Abs: 1.2 10*3/uL (ref 0.7–4.0)
MCHC: 31.9 g/dL (ref 30.0–36.0)
MCV: 63.9 fl — ABNORMAL LOW (ref 78.0–100.0)
Monocytes Absolute: 0.3 10*3/uL (ref 0.1–1.0)
Monocytes Relative: 5.5 % (ref 3.0–12.0)
Neutro Abs: 4.7 10*3/uL (ref 1.4–7.7)
Neutrophils Relative %: 74.4 % (ref 43.0–77.0)
Platelets: 244 10*3/uL (ref 150.0–400.0)
RBC: 5.81 Mil/uL — ABNORMAL HIGH (ref 3.87–5.11)
RDW: 14.7 % — ABNORMAL HIGH (ref 11.5–14.6)
WBC: 6.3 10*3/uL (ref 4.5–10.5)

## 2013-01-22 NOTE — Patient Instructions (Addendum)
Follow up in 6 months to recheck BP and cholesterol We'll notify you of your lab results and make any changes if needed Keep up the good work!  You look great! Call with any questions or concerns Happy Fall!!!

## 2013-01-22 NOTE — Progress Notes (Signed)
  Subjective:    Patient ID: Breanna Webster, female    DOB: [birth year], 67 y.o.   MRN: [id number]  HPI Here today for CPE.  Risk Factors: HTN- chronic problem, adequate control on Losartan, Tenormin.  Denies CP, SOB, HAs, visual changes, edema. Hyperlipidemia- chronic problem, attempting to control w/ diet and exercise b/c she is statin intolerant. Physical Activity: 3x/week at gym Fall Risk: low risk Depression: no current sxs, doing well on meds Hearing: normal to conversational tones and whispered voice at 6 ft ADL's: independent Cognitive: normal linear thought process, memory and attention intact Home Safety: safe at home, lives w/ husband Height, Weight, BMI, Visual Acuity: see vitals, vision corrected to 20/20 w/ glasses Counseling: due for colonoscopy 2015, GYN- [facility], goes next week. Labs Ordered: See A&P Care Plan: See A&P    Review of Systems Patient reports no vision/ hearing changes, adenopathy,fever, weight change,  persistant/recurrent hoarseness , swallowing issues, chest pain, palpitations, edema, persistant/recurrent cough, hemoptysis, dyspnea (rest/exertional/paroxysmal nocturnal), gastrointestinal bleeding (melena, rectal bleeding), abdominal pain, significant heartburn, bowel changes, GU symptoms (dysuria, hematuria, incontinence), Gyn symptoms (abnormal  bleeding, pain),  syncope, focal weakness, memory loss, numbness & tingling, skin/hair/nail changes, abnormal bruising or bleeding, anxiety, or depression.     Objective:   Physical Exam General Appearance:    Alert, cooperative, no distress, appears stated age  Head:    Normocephalic, without obvious abnormality, atraumatic  Eyes:    PERRL, conjunctiva/corneas clear, EOM's intact, fundi    benign, both eyes  Ears:    Normal TM's and external ear canals, both ears  Nose:   Nares normal, septum midline, mucosa normal, no drainage    or sinus tenderness  Throat:   Lips, mucosa, and tongue normal; teeth and  gums normal  Neck:   Supple, symmetrical, trachea midline, no adenopathy;    Thyroid: no enlargement/tenderness/nodules  Back:     Symmetric, no curvature, ROM normal, no CVA tenderness  Lungs:     Clear to auscultation bilaterally, respirations unlabored  Chest Wall:    No tenderness or deformity   Heart:    Regular rate and rhythm, S1 and S2 normal, no murmur, rub   or gallop  Breast Exam:    Deferred to GYN  Abdomen:     Soft, non-tender, bowel sounds active all four quadrants,    no masses, no organomegaly  Genitalia:    Deferred to GYN  Rectal:    Extremities:   Extremities normal, atraumatic, no cyanosis or edema  Pulses:   2+ and symmetric all extremities  Skin:   Skin color, texture, turgor normal, no rashes or lesions  Lymph nodes:   Cervical, supraclavicular, and axillary nodes normal  Neurologic:   CNII-XII intact, normal strength, sensation and reflexes    throughout          Assessment & Plan:

## 2013-01-23 LAB — BASIC METABOLIC PANEL
BUN: 13 mg/dL (ref 6–23)
CO2: 26 mEq/L (ref 19–32)
Calcium: 9.3 mg/dL (ref 8.4–10.5)
Chloride: 104 mEq/L (ref 96–112)
Creatinine, Ser: 0.7 mg/dL (ref 0.4–1.2)
GFR: 96 mL/min (ref >60.00)
Glucose, Bld: 128 mg/dL — ABNORMAL HIGH (ref 70–99)
Potassium: 4 mEq/L (ref 3.5–5.1)
Sodium: 137 mEq/L (ref 135–145)

## 2013-01-23 LAB — LIPID PANEL
Cholesterol: 168 mg/dL (ref 0–200)
HDL: 39.6 mg/dL (ref >39.00)
LDL Cholesterol: 97 mg/dL (ref 0–99)
Total CHOL/HDL Ratio: 4
Triglycerides: 158 mg/dL — ABNORMAL HIGH (ref 0.0–149.0)
VLDL: 31.6 mg/dL (ref 0.0–40.0)

## 2013-01-23 LAB — HEPATIC FUNCTION PANEL
ALT: 32 U/L (ref 0–35)
AST: 29 U/L (ref 0–37)
Albumin: 4 g/dL (ref 3.5–5.2)
Alkaline Phosphatase: 55 U/L (ref 39–117)
Bilirubin, Direct: 0.1 mg/dL (ref 0.0–0.3)
Total Bilirubin: 0.8 mg/dL (ref 0.3–1.2)
Total Protein: 6.9 g/dL (ref 6.0–8.3)

## 2013-01-23 LAB — TSH: TSH: 0.52 u[IU]/mL (ref 0.35–5.50)

## 2013-01-24 ENCOUNTER — Other Ambulatory Visit: Payer: Self-pay | Admitting: Family Medicine

## 2013-01-26 DIAGNOSIS — E559 Vitamin D deficiency, unspecified: Secondary | ICD-10-CM | POA: Diagnosis not present

## 2013-01-26 DIAGNOSIS — N899 Noninflammatory disorder of vagina, unspecified: Secondary | ICD-10-CM | POA: Diagnosis not present

## 2013-01-26 NOTE — Telephone Encounter (Signed)
Med filled.  

## 2013-01-27 LAB — VITAMIN D 1,25 DIHYDROXY
Vitamin D 1, 25 (OH)2 Total: 25 pg/mL (ref 18–72)
Vitamin D2 1, 25 (OH)2: <8 pg/mL
Vitamin D3 1, 25 (OH)2: 25 pg/mL

## 2013-01-27 NOTE — Assessment & Plan Note (Signed)
Chronic problem.  Adequate control.  Asymptomatic.  Check labs.  No anticipated med changes 

## 2013-01-27 NOTE — Assessment & Plan Note (Signed)
Check labs.  Replete prn. 

## 2013-01-27 NOTE — Assessment & Plan Note (Signed)
Chronic problem.  Currently asymptomatic.  Check labs.  Adjust meds prn

## 2013-01-27 NOTE — Assessment & Plan Note (Signed)
Pt's PE WNL.  UTD on colonoscopy, has GYN appt next week.  Check labs.  Anticipatory guidance provided.

## 2013-01-27 NOTE — Assessment & Plan Note (Signed)
Chronic problem.  Intolerant to meds.  Only other option would be Welchol.  Check labs.  Adjust meds prn

## 2013-01-28 ENCOUNTER — Other Ambulatory Visit: Payer: Self-pay | Admitting: General Practice

## 2013-01-28 MED ORDER — VITAMIN D (ERGOCALCIFEROL) 1.25 MG (50000 UNIT) PO CAPS: 50000.0000 [IU] | ORAL_CAPSULE | ORAL | Status: DC

## 2013-02-12 ENCOUNTER — Other Ambulatory Visit (HOSPITAL_COMMUNITY): Payer: Self-pay | Admitting: Obstetrics

## 2013-02-12 DIAGNOSIS — Z1231 Encounter for screening mammogram for malignant neoplasm of breast: Secondary | ICD-10-CM

## 2013-02-22 ENCOUNTER — Other Ambulatory Visit: Payer: Self-pay | Admitting: Family Medicine

## 2013-02-23 NOTE — Telephone Encounter (Signed)
Med filled.  

## 2013-02-26 ENCOUNTER — Ambulatory Visit (HOSPITAL_COMMUNITY)
Admission: RE | Admit: 2013-02-26 | Discharge: 2013-02-26 | Disposition: A | Discharge status: Home / Self Care | Payer: Medicare Other | Source: Ambulatory Visit | Attending: Obstetrics | Admitting: Obstetrics

## 2013-02-26 DIAGNOSIS — Z1231 Encounter for screening mammogram for malignant neoplasm of breast: Secondary | ICD-10-CM | POA: Diagnosis not present

## 2013-03-17 ENCOUNTER — Other Ambulatory Visit: Payer: Self-pay | Admitting: Family Medicine

## 2013-03-17 NOTE — Telephone Encounter (Signed)
Med filled.  

## 2013-03-23 ENCOUNTER — Other Ambulatory Visit: Payer: Self-pay | Admitting: Family Medicine

## 2013-03-23 NOTE — Telephone Encounter (Signed)
Med filled.  

## 2013-04-09 DIAGNOSIS — H2510 Age-related nuclear cataract, unspecified eye: Secondary | ICD-10-CM | POA: Diagnosis not present

## 2013-04-27 ENCOUNTER — Other Ambulatory Visit: Payer: Self-pay | Admitting: Endocrinology

## 2013-04-27 ENCOUNTER — Other Ambulatory Visit: Payer: Self-pay | Admitting: *Deleted

## 2013-04-27 DIAGNOSIS — E559 Vitamin D deficiency, unspecified: Secondary | ICD-10-CM | POA: Diagnosis not present

## 2013-04-27 MED ORDER — SITAGLIP PHOS-METFORMIN HCL ER 50-1000 MG PO TB24: 1.0000 | ORAL_TABLET | Freq: Two times a day (BID) | ORAL | Status: DC

## 2013-05-02 ENCOUNTER — Other Ambulatory Visit: Payer: Self-pay | Admitting: Family Medicine

## 2013-05-04 NOTE — Telephone Encounter (Signed)
Med filled.  

## 2013-05-07 ENCOUNTER — Encounter: Payer: Self-pay | Admitting: Family Medicine

## 2013-05-07 ENCOUNTER — Ambulatory Visit (INDEPENDENT_AMBULATORY_CARE_PROVIDER_SITE_OTHER): Payer: Medicare Other | Admitting: Family Medicine

## 2013-05-07 VITALS — BP 140/76 | HR 77 | Temp 98.4°F | Resp 16 | Wt 182.0 lb

## 2013-05-07 DIAGNOSIS — H6990 Unspecified Eustachian tube disorder, unspecified ear: Secondary | ICD-10-CM | POA: Insufficient documentation

## 2013-05-07 DIAGNOSIS — H6980 Other specified disorders of Eustachian tube, unspecified ear: Secondary | ICD-10-CM | POA: Diagnosis not present

## 2013-05-07 NOTE — Patient Instructions (Signed)
Follow up in 1 month to recheck BP Start Claritin to decrease the pressure in your ear Coricidin HBP for congestion Call and ask your insurance company and ask if with your history of diabetes and high blood pressure a carotid ultrasound would be covered Drink plenty of fluids Call with any questions or concerns Happy New Year!!!

## 2013-05-07 NOTE — Progress Notes (Signed)
   Subjective:    Patient ID: Breanna Webster, female    DOB: 1945-12-23, 68 y.o.   MRN: [id number]  HPI R ear pain- sxs started 1 week ago.  Also having sore throat.  No fevers.  No sinus pain/pressure.  No cough.     Review of Systems For ROS see HPI     Objective:   Physical Exam  Vitals reviewed. Constitutional: She appears well-developed and well-nourished. No distress.  HENT:  Head: Normocephalic and atraumatic.  Right Ear: Tympanic membrane is retracted.  Left Ear: Tympanic membrane normal.  Nose: Mucosal edema and rhinorrhea present. Right sinus exhibits no maxillary sinus tenderness and no frontal sinus tenderness. Left sinus exhibits no maxillary sinus tenderness and no frontal sinus tenderness.  Mouth/Throat: Mucous membranes are normal. Posterior oropharyngeal erythema (w/ PND) present.  Eyes: Conjunctivae and EOM are normal. Pupils are equal, round, and reactive to light.  Neck: Normal range of motion. Neck supple.  Cardiovascular: Normal rate, regular rhythm and normal heart sounds.   Pulmonary/Chest: Effort normal and breath sounds normal. No respiratory distress. She has no wheezes. She has no rales.  Lymphadenopathy:    She has no cervical adenopathy.          Assessment & Plan:

## 2013-05-07 NOTE — Progress Notes (Signed)
Pre visit review using our clinic review tool, if applicable. No additional management support is needed unless otherwise documented below in the visit note. 

## 2013-05-13 ENCOUNTER — Telehealth: Payer: Self-pay | Admitting: Family Medicine

## 2013-05-13 NOTE — Telephone Encounter (Signed)
Patient states that her insurance company needs the dx code in order to see if a carotid ultrasound is covered under her insurance plan. Please advise.

## 2013-05-13 NOTE — Telephone Encounter (Signed)
Pt notified of diabetes mellitus code 250.00, and hyperlipidemia code 272.4.

## 2013-05-17 NOTE — Assessment & Plan Note (Signed)
New.  Suspect this is the cause of pt's ear pain.  Start decongestant, OTC antihistamine.  Reviewed supportive care and red flags that should prompt return.  Pt expressed understanding and is in agreement w/ plan.

## 2013-05-22 ENCOUNTER — Other Ambulatory Visit: Payer: Self-pay

## 2013-05-22 MED ORDER — PIOGLITAZONE HCL 15 MG PO TABS: 15.0000 mg | ORAL_TABLET | ORAL | Status: DC

## 2013-06-05 ENCOUNTER — Encounter: Payer: Self-pay | Admitting: Family Medicine

## 2013-06-05 ENCOUNTER — Ambulatory Visit (INDEPENDENT_AMBULATORY_CARE_PROVIDER_SITE_OTHER): Payer: Medicare Other | Admitting: Family Medicine

## 2013-06-05 VITALS — BP 130/84 | HR 77 | Temp 98.2°F | Resp 16 | Wt 187.4 lb

## 2013-06-05 DIAGNOSIS — I6529 Occlusion and stenosis of unspecified carotid artery: Secondary | ICD-10-CM | POA: Diagnosis not present

## 2013-06-05 DIAGNOSIS — Z1211 Encounter for screening for malignant neoplasm of colon: Secondary | ICD-10-CM | POA: Diagnosis not present

## 2013-06-05 DIAGNOSIS — I10 Essential (primary) hypertension: Secondary | ICD-10-CM | POA: Diagnosis not present

## 2013-06-05 NOTE — Progress Notes (Signed)
   Subjective:    Patient ID: Breanna Webster, female    DOB: 04/11/1946, 68 y.o.   MRN: [id number]  HPI HTN- BP was mildly elevated at last visit.  Better today, yesterday was 132/80.  On Cozaar, atenolol.  Denies CP, SOB, HAs, visual changes, edema.    Carotid stenosis- pt had screen done through [facility] 11/16/09 and was found to have mild/moderate plaque.  They are recommending repeat scan for $135.  Colonoscopy- pt due this year   Review of Systems For ROS see HPI     Objective:   Physical Exam  Constitutional: She is oriented to person, place, and time. She appears well-developed and well-nourished. No distress.  HENT:  Head: Normocephalic and atraumatic.  Eyes: Conjunctivae and EOM are normal. Pupils are equal, round, and reactive to light.  Neck: Normal range of motion. Neck supple. No thyromegaly present.  Cardiovascular: Normal rate, regular rhythm and intact distal pulses.   Murmur (II/VI SEM heard best over RUSB) heard. Pulmonary/Chest: Effort normal and breath sounds normal. No respiratory distress.  Abdominal: Soft. She exhibits no distension. There is no tenderness.  Musculoskeletal: She exhibits no edema.  Lymphadenopathy:    She has no cervical adenopathy.  Neurological: She is alert and oriented to person, place, and time.  Skin: Skin is warm and dry.  Psychiatric: She has a normal mood and affect. Her behavior is normal.          Assessment & Plan:

## 2013-06-05 NOTE — Progress Notes (Signed)
Pre visit review using our clinic review tool, if applicable. No additional management support is needed unless otherwise documented below in the visit note. 

## 2013-06-05 NOTE — Assessment & Plan Note (Signed)
New to provider.  Pt brought copy of letter from [facility] indicating she had mild to moderate stenosis and recommending repeat scan.  Will refer for this.

## 2013-06-05 NOTE — Patient Instructions (Signed)
Schedule your complete physical for after October We'll call you with your Carotid Ultrasound and Colonoscopy appts Call with any questions or concerns Happy Valentine's Day!

## 2013-06-05 NOTE — Assessment & Plan Note (Signed)
BP better controlled today.  Asymptomatic.  No changes.

## 2013-06-08 ENCOUNTER — Telehealth: Payer: Self-pay | Admitting: Family Medicine

## 2013-06-08 NOTE — Telephone Encounter (Signed)
Relevant patient education assigned to patient using Emmi. ° °

## 2013-06-16 ENCOUNTER — Other Ambulatory Visit: Payer: Self-pay | Admitting: Family Medicine

## 2013-06-17 NOTE — Telephone Encounter (Signed)
Atenolol refilled per protocol. JG//CMA

## 2013-06-19 ENCOUNTER — Encounter: Payer: Self-pay | Admitting: Gastroenterology

## 2013-06-24 ENCOUNTER — Other Ambulatory Visit (HOSPITAL_COMMUNITY): Payer: Self-pay | Admitting: Family Medicine

## 2013-06-24 ENCOUNTER — Other Ambulatory Visit: Payer: Self-pay | Admitting: Family Medicine

## 2013-06-24 DIAGNOSIS — I6529 Occlusion and stenosis of unspecified carotid artery: Secondary | ICD-10-CM

## 2013-06-25 ENCOUNTER — Inpatient Hospital Stay (HOSPITAL_COMMUNITY): Admission: RE | Admit: 2013-06-25 | Payer: Medicare Other | Source: Ambulatory Visit

## 2013-06-27 ENCOUNTER — Other Ambulatory Visit: Payer: Self-pay | Admitting: Family Medicine

## 2013-06-29 ENCOUNTER — Ambulatory Visit (INDEPENDENT_AMBULATORY_CARE_PROVIDER_SITE_OTHER): Payer: Medicare Other | Admitting: Endocrinology

## 2013-06-29 ENCOUNTER — Encounter: Payer: Self-pay | Admitting: Endocrinology

## 2013-06-29 VITALS — BP 128/86 | HR 66 | Temp 97.9°F | Ht 65.75 in | Wt 184.0 lb

## 2013-06-29 DIAGNOSIS — E119 Type 2 diabetes mellitus without complications: Secondary | ICD-10-CM | POA: Diagnosis not present

## 2013-06-29 DIAGNOSIS — I6529 Occlusion and stenosis of unspecified carotid artery: Secondary | ICD-10-CM | POA: Diagnosis not present

## 2013-06-29 LAB — HEMOGLOBIN A1C: Hgb A1c MFr Bld: 6.1 % (ref 4.6–6.5)

## 2013-06-29 NOTE — Telephone Encounter (Signed)
Med filled.  

## 2013-06-29 NOTE — Patient Instructions (Addendum)
A diabetes blood test are being requested for you today.  We'll contact you with results.   Please come back for a follow-up appointment in 6 months.   Please let me know if you want to change the janumet to "invokamet."   Also, you can call to see if "kombiglyze" is a cheaper alternative to janumet.

## 2013-06-29 NOTE — Progress Notes (Signed)
Subjective:    Patient ID: Breanna Webster, female    DOB: 1946/04/16, 68 y.o.   MRN: [id number]  HPI Pt returns for f/u of type 2 DM (dx'ed 2003; she has mild if any neuropathy of the lower extremities; no known associated chronic complications; she has never been on insulin).  She says cbg's are well-controlled. pt states she feels well in general.  She wants to consider changing janumet to invokamet.  I told her a trial would be OK with me. Past Medical History  Diagnosis Date  . Diabetes mellitus   . GERD (gastroesophageal reflux disease)   . Grave's disease   . Hyperlipidemia   . Osteopenia   . Hypertension     Past Surgical History  Procedure Laterality Date  . Hysteroscopy      fibroids  . Laparoscopy      fibroids  . Cholecystectomy    . Hiatal hernia repair      History   Social History  . Marital Status: Married    Spouse Name: N/A    Number of Children: N/A  . Years of Education: N/A   Occupational History  . Not on file.   Social History Main Topics  . Smoking status: Never Smoker   . Smokeless tobacco: Not on file  . Alcohol Use: No  . Drug Use: No  . Sexual Activity:    Other Topics Concern  . Not on file   Social History Narrative  . No narrative on file    Current Outpatient Prescriptions on File Prior to Visit  Medication Sig Dispense Refill  . aspirin 81 MG tablet Take 81 mg by mouth daily.        Marland Kitchen atenolol (TENORMIN) 25 MG tablet take 1 tablet by mouth once daily  90 tablet  1  . escitalopram (LEXAPRO) 20 MG tablet take 1 tablet by mouth once daily  90 tablet  3  . losartan (COZAAR) 100 MG tablet take 1 tablet by mouth once daily  30 tablet  5  . Multiple Vitamin (MULITIVITAMIN WITH MINERALS) TABS Take 1 tablet by mouth daily.      . nortriptyline (PAMELOR) 25 MG capsule take 1 capsule by mouth at bedtime  90 capsule  1  . pioglitazone (ACTOS) 15 MG tablet Take 1 tablet (15 mg total) by mouth every other day.  90 tablet  0  .  SitaGLIPtin-MetFORMIN HCl (JANUMET XR) 50-1000 MG TB24 Take 1 tablet by mouth 2 (two) times daily.  60 tablet  3  . VITAMIN D, CHOLECALCIFEROL, PO Take 2,000 Units by mouth daily.      Marland Kitchen SYNTHROID 150 MCG tablet take 1 tablet by mouth once daily  90 tablet  1   No current facility-administered medications on file prior to visit.    No Known Allergies  Family History  Problem Relation Age of Onset  . Adopted: Yes    BP 128/86  Pulse 66  Temp(Src) 97.9 F (36.6 C) (Oral)  Ht 5' 5.75" (1.67 m)  Wt 184 lb (83.462 kg)  BMI 29.93 kg/m2  SpO2 97%   Review of Systems Denies edema.  She has weight gain    Objective:   Physical Exam VITAL SIGNS:  See vs page GENERAL: no distress  Lab Results  Component Value Date   HGBA1C 6.1 06/29/2013   Lab Results  Component Value Date   ALT 32 01/22/2013   AST 29 01/22/2013   ALKPHOS 55 01/22/2013   BILITOT  0.8 01/22/2013      Assessment & Plan:  Type 2 DM: well-controlled Weight gain: this complicates the rx of DM. NASH: well-controlled on pioglitizone, she she should continue this.

## 2013-07-03 ENCOUNTER — Telehealth: Payer: Self-pay | Admitting: Family Medicine

## 2013-07-03 NOTE — Telephone Encounter (Signed)
[place] w/ me.  Give her my best!

## 2013-07-03 NOTE — Telephone Encounter (Signed)
Patient would like to transfer from Dr. Birdie Riddle to Dr. Shawna Orleans.

## 2013-07-13 ENCOUNTER — Ambulatory Visit (HOSPITAL_COMMUNITY)
Admission: RE | Admit: 2013-07-13 | Discharge: 2013-07-13 | Disposition: A | Discharge status: Home / Self Care | Payer: Medicare Other | Source: Ambulatory Visit | Attending: Family Medicine | Admitting: Family Medicine

## 2013-07-13 DIAGNOSIS — E785 Hyperlipidemia, unspecified: Secondary | ICD-10-CM | POA: Insufficient documentation

## 2013-07-13 DIAGNOSIS — I6529 Occlusion and stenosis of unspecified carotid artery: Secondary | ICD-10-CM | POA: Diagnosis not present

## 2013-07-13 DIAGNOSIS — I10 Essential (primary) hypertension: Secondary | ICD-10-CM | POA: Diagnosis not present

## 2013-07-13 DIAGNOSIS — I658 Occlusion and stenosis of other precerebral arteries: Secondary | ICD-10-CM | POA: Diagnosis not present

## 2013-07-13 NOTE — Progress Notes (Signed)
*  PRELIMINARY RESULTS* Vascular Ultrasound Carotid Duplex (Doppler) has been completed.   Findings suggest 1-39% internal carotid artery stenosis bilaterally. Vertebral arteries are patent with antegrade flow.  07/13/2013 11:32 AM Maudry Mayhew, RVT, RDCS, RDMS

## 2013-07-21 ENCOUNTER — Other Ambulatory Visit: Payer: Self-pay | Admitting: Family Medicine

## 2013-07-21 NOTE — Telephone Encounter (Signed)
Med filled.  

## 2013-07-22 ENCOUNTER — Ambulatory Visit (AMBULATORY_SURGERY_CENTER): Payer: Self-pay

## 2013-07-22 VITALS — Ht 65.5 in | Wt 186.0 lb

## 2013-07-22 DIAGNOSIS — Z1211 Encounter for screening for malignant neoplasm of colon: Secondary | ICD-10-CM

## 2013-07-22 MED ORDER — SUPREP BOWEL PREP KIT 17.5-3.13-1.6 GM/177ML PO SOLN: 1.0000 | Freq: Once | ORAL | Status: DC

## 2013-07-23 ENCOUNTER — Ambulatory Visit: Payer: PRIVATE HEALTH INSURANCE | Admitting: Family Medicine

## 2013-08-03 DIAGNOSIS — E559 Vitamin D deficiency, unspecified: Secondary | ICD-10-CM | POA: Diagnosis not present

## 2013-08-06 ENCOUNTER — Encounter: Payer: Self-pay | Admitting: Gastroenterology

## 2013-08-06 ENCOUNTER — Ambulatory Visit (AMBULATORY_SURGERY_CENTER): Payer: Medicare Other | Admitting: Gastroenterology

## 2013-08-06 VITALS — BP 149/78 | HR 64 | Temp 99.0°F | Resp 22 | Ht 65.0 in | Wt 186.0 lb

## 2013-08-06 DIAGNOSIS — Z1211 Encounter for screening for malignant neoplasm of colon: Secondary | ICD-10-CM | POA: Diagnosis not present

## 2013-08-06 DIAGNOSIS — K5730 Diverticulosis of large intestine without perforation or abscess without bleeding: Secondary | ICD-10-CM

## 2013-08-06 DIAGNOSIS — K648 Other hemorrhoids: Secondary | ICD-10-CM

## 2013-08-06 DIAGNOSIS — D126 Benign neoplasm of colon, unspecified: Secondary | ICD-10-CM

## 2013-08-06 DIAGNOSIS — E049 Nontoxic goiter, unspecified: Secondary | ICD-10-CM | POA: Diagnosis not present

## 2013-08-06 DIAGNOSIS — I10 Essential (primary) hypertension: Secondary | ICD-10-CM | POA: Diagnosis not present

## 2013-08-06 DIAGNOSIS — E669 Obesity, unspecified: Secondary | ICD-10-CM | POA: Diagnosis not present

## 2013-08-06 DIAGNOSIS — E039 Hypothyroidism, unspecified: Secondary | ICD-10-CM | POA: Diagnosis not present

## 2013-08-06 DIAGNOSIS — E119 Type 2 diabetes mellitus without complications: Secondary | ICD-10-CM | POA: Diagnosis not present

## 2013-08-06 LAB — GLUCOSE, CAPILLARY
Glucose-Capillary: 126 mg/dL — ABNORMAL HIGH (ref 70–99)
Glucose-Capillary: 121 mg/dL — ABNORMAL HIGH (ref 70–99)

## 2013-08-06 MED ORDER — SODIUM CHLORIDE 0.9 % IV SOLN: 500.0000 mL | INTRAVENOUS | Status: DC

## 2013-08-06 NOTE — Progress Notes (Signed)
Procedure ends, to recovery, report given and VSS. 

## 2013-08-06 NOTE — Op Note (Signed)
[place]  [address] [place], [id number]   COLONOSCOPY PROCEDURE REPORT  PATIENT: Breanna, Webster  MR#: [id number] BIRTHDATE: [birth year] , 52  yrs. old GENDER: Female ENDOSCOPIST: Inda Castle, MD REFERRED WU:[id number] Birdie Riddle, M.D. PROCEDURE DATE:  08/06/2013 PROCEDURE:   Colonoscopy with snare polypectomy and Colonoscopy with cold biopsy polypectomy First Screening Colonoscopy - Avg.  risk and is 50 yrs.  old or older - No.  Prior Negative Screening - Now for repeat screening. 10 or more years since last screening  History of Adenoma - Now for follow-up colonoscopy & has been > or = to 3 yrs.  N/A  Polyps Removed Today? Yes. ASA CLASS:   Class II INDICATIONS:Average risk patient for colon cancer. MEDICATIONS: MAC sedation, administered by CRNA and Propofol (Diprivan) 270 mg IV  DESCRIPTION OF PROCEDURE:   After the risks benefits and alternatives of the procedure were thoroughly explained, informed consent was obtained.  A digital rectal exam revealed no abnormalities of the rectum.   The LB [id number]  endoscope was introduced through the anus and advanced to the cecum, which was identified by both the appendix and ileocecal valve. No adverse events experienced.   The quality of the prep was Suprep good  The instrument was then slowly withdrawn as the colon was fully examined.      COLON FINDINGS: Two sessile polyps measuring 2 mm in size were found at the cecum.  A polypectomy was performed with cold forceps.   A sessile polyp measuring 3 mm in size was found in the proximal descending colon.  A polypectomy was performed.  The resection was complete and the polyp tissue was completely retrieved.   A sessile polyp measuring 4 mm in size was found in the distal descending colon.  A polypectomy was performed.  The resection was complete and the polyp tissue was completely retrieved.   Moderate diverticulosis was noted in the sigmoid colon  and descending colon. Mild diverticulosis was noted in the transverse colon and ascending colon.   Internal hemorrhoids were found.  Retroflexed views revealed no abnormalities. The time to cecum=7 minutes 46 seconds.  Withdrawal time=21 minutes 16 seconds.  The scope was withdrawn and the procedure completed. COMPLICATIONS: There were no complications.  ENDOSCOPIC IMPRESSION: 1.   Two sessile polyps measuring 2 mm in size were found at the cecum; polypectomy was performed with cold forceps 2.   Sessile polyp measuring 3 mm in size was found in the proximal descending colon; polypectomy was performed 3.   Sessile polyp measuring 4 mm in size was found in the distal descending colon; polypectomy was performed 4.   Moderate diverticulosis was noted in the sigmoid colon and descending colon 5.   Mild diverticulosis was noted in the transverse colon and ascending colon 6.   Internal hemorrhoids  RECOMMENDATIONS: If the polyp(s) removed today are proven to be adenomatous (pre-cancerous) polyps, you will need a colonoscopy in 3 years . If some of the polyps are adenomatous you should have followup colonoscopy in 5 years. If all the polyps are not precancerous (hyperplastic polyps) followup colonoscopy in 10 years.  You will receive a letter within 1-2 weeks with the results of your biopsy as well as final recommendations.  Please call my office if you have not received a letter after 3 weeks.   eSigned:  Inda Castle, MD 08/06/2013 12:17 PM   cc:   PATIENT NAME:  Breanna, Webster MR#: [id number]

## 2013-08-06 NOTE — Progress Notes (Signed)
Called to room to assist during endoscopic procedure.  Patient ID and intended procedure confirmed with present staff. Received instructions for my participation in the procedure from the performing physician.  

## 2013-08-06 NOTE — Patient Instructions (Signed)
Discharge instructions given with verbal understanding. Handouts on polyps,diverticulosis and hemorrhoids. Resume previous medications. YOU HAD AN ENDOSCOPIC PROCEDURE TODAY AT [facility]: Refer to the procedure report that was given to you for any specific questions about what was found during the examination.  If the procedure report does not answer your questions, please call your gastroenterologist to clarify.  If you requested that your care partner not be given the details of your procedure findings, then the procedure report has been included in a sealed envelope for you to review at your convenience later.  YOU SHOULD EXPECT: Some feelings of bloating in the abdomen. Passage of more gas than usual.  Walking can help get rid of the air that was put into your GI tract during the procedure and reduce the bloating. If you had a lower endoscopy (such as a colonoscopy or flexible sigmoidoscopy) you may notice spotting of blood in your stool or on the toilet paper. If you underwent a bowel prep for your procedure, then you may not have a normal bowel movement for a few days.  DIET: Your first meal following the procedure should be a light meal and then it is ok to progress to your normal diet.  A half-sandwich or bowl of soup is an example of a good first meal.  Heavy or fried foods are harder to digest and may make you feel nauseous or bloated.  Likewise meals heavy in dairy and vegetables can cause extra gas to form and this can also increase the bloating.  Drink plenty of fluids but you should avoid alcoholic beverages for 24 hours.  ACTIVITY: Your care partner should take you home directly after the procedure.  You should plan to take it easy, moving slowly for the rest of the day.  You can resume normal activity the day after the procedure however you should NOT DRIVE or use heavy machinery for 24 hours (because of the sedation medicines used during the test).    SYMPTOMS TO REPORT  IMMEDIATELY: A gastroenterologist can be reached at any hour.  During normal business hours, 8:30 AM to 5:00 PM Monday through Friday, call [phone].  After hours and on weekends, please call the GI answering service at [phone] who will take a message and have the physician on call contact you.   Following lower endoscopy (colonoscopy or flexible sigmoidoscopy):  Excessive amounts of blood in the stool  Significant tenderness or worsening of abdominal pains  Swelling of the abdomen that is new, acute  Fever of 100F or higher  FOLLOW UP: If any biopsies were taken you will be contacted by phone or by letter within the next 1-3 weeks.  Call your gastroenterologist if you have not heard about the biopsies in 3 weeks.  Our staff will call the home number listed on your records the next business day following your procedure to check on you and address any questions or concerns that you may have at that time regarding the information given to you following your procedure. This is a courtesy call and so if there is no answer at the home number and we have not heard from you through the emergency physician on call, we will assume that you have returned to your regular daily activities without incident.  SIGNATURES/CONFIDENTIALITY: You and/or your care partner have signed paperwork which will be entered into your electronic medical record.  These signatures attest to the fact that that the information above on your After Visit Summary  has been reviewed and is understood.  Full responsibility of the confidentiality of this discharge information lies with you and/or your care-partner.

## 2013-08-07 ENCOUNTER — Telehealth: Payer: Self-pay | Admitting: *Deleted

## 2013-08-07 NOTE — Telephone Encounter (Signed)
  Follow up Call-  Call back number 08/06/2013  Post procedure Call Back phone  # [phone]  Permission to leave phone message Yes     Patient questions:  Do you have a fever, pain , or abdominal swelling? no Pain Score  0 *  Have you tolerated food without any problems? yes  Have you been able to return to your normal activities? yes  Do you have any questions about your discharge instructions: Diet   no Medications  no Follow up visit  no  Do you have questions or concerns about your Care? no  Actions: * If pain score is 4 or above: No action needed, pain <4.   Spoke with [name], spouse.  States pt is still asleep but she did great yesterday post procedure.  No problems.  Requested that he let pt. Know she was contacted and to call if questions.

## 2013-08-11 ENCOUNTER — Encounter: Payer: Self-pay | Admitting: Gastroenterology

## 2013-08-14 ENCOUNTER — Encounter: Payer: Self-pay | Admitting: Internal Medicine

## 2013-08-14 ENCOUNTER — Ambulatory Visit (INDEPENDENT_AMBULATORY_CARE_PROVIDER_SITE_OTHER): Payer: Medicare Other | Admitting: Internal Medicine

## 2013-08-14 VITALS — BP 136/70 | HR 88 | Temp 98.4°F | Ht 65.0 in | Wt 184.0 lb

## 2013-08-14 DIAGNOSIS — E119 Type 2 diabetes mellitus without complications: Secondary | ICD-10-CM

## 2013-08-14 DIAGNOSIS — I6529 Occlusion and stenosis of unspecified carotid artery: Secondary | ICD-10-CM | POA: Diagnosis not present

## 2013-08-14 DIAGNOSIS — E785 Hyperlipidemia, unspecified: Secondary | ICD-10-CM | POA: Diagnosis not present

## 2013-08-14 DIAGNOSIS — D649 Anemia, unspecified: Secondary | ICD-10-CM | POA: Diagnosis not present

## 2013-08-14 MED ORDER — INSULIN PEN NEEDLE 31G X 8 MM MISC: Status: DC

## 2013-08-14 MED ORDER — LIRAGLUTIDE 18 MG/3ML ~~LOC~~ SOPN: 1.2000 mg | PEN_INJECTOR | Freq: Every day | SUBCUTANEOUS | Status: DC

## 2013-08-14 MED ORDER — METFORMIN HCL ER (MOD) 1000 MG PO TB24: 1000.0000 mg | ORAL_TABLET | Freq: Two times a day (BID) | ORAL | Status: DC

## 2013-08-14 MED ORDER — METFORMIN HCL ER (OSM) 1000 MG PO TB24: 1000.0000 mg | ORAL_TABLET | Freq: Every day | ORAL | Status: DC

## 2013-08-14 NOTE — Assessment & Plan Note (Signed)
Intolerant to statins. 

## 2013-08-14 NOTE — Assessment & Plan Note (Signed)
Patient's diabetes is well-controlled however she is frustrated with weight loss. Discontinue Januvia and Actos. Continue metformin xr 1000 mg twice daily. Trial of Victoza. Patient start with 0.6 mg once daily for one week then titrate to 1.2 mg thereafter. Reassess in one month. We discussed common side effects. Lab Results  Component Value Date   HGBA1C 6.1 06/29/2013

## 2013-08-14 NOTE — Progress Notes (Signed)
Pre visit review using our clinic review tool, if applicable. No additional management support is needed unless otherwise documented below in the visit note. 

## 2013-08-14 NOTE — Progress Notes (Signed)
Subjective:    Patient ID: Breanna Webster, female    DOB: 03/29/1946, 68 y.o.   MRN: [id number]  HPI  68 year old white female with history of type 2 diabetes, hypertension and hyperlipidemia for continuity of care. She is transferring from Dr. Birdie Riddle.  Patient reports she has been diabetic for 20 years. Her A1c is very well controlled. She is followed by endocrinologist-Dr. Loanne Drilling. Patient currently taking Janumet XR 50/1000 mg twice daily and Actos 15 mg once daily. Patient has been working hard towards weight loss but unable to lose significant amount of weight.  Patient has history of Graves' disease. Patient reports her thyroid gland was partially radiated. She is currently on Synthroid 150 mcg.  Patient has tried to take statins in the past. Unfortunately she is statin intolerant secondary to severe muscle complaints.  Review of Systems  Constitutional: Negative for activity change, appetite change and unexpected weight change.  Eyes: Negative for visual disturbance.   She is up to date with diabetic eye exam Respiratory: Negative for cough, chest tightness and shortness of breath.   Cardiovascular: Negative for chest pain.      Past Medical History  Diagnosis Date  . Diabetes mellitus   . GERD (gastroesophageal reflux disease)   . Grave's disease   . Hyperlipidemia   . Osteopenia   . Hypertension   . Carotid artery stenosis     Mild  . Thalassemia minor     History   Social History  . Marital Status: Married    Spouse Name: N/A    Number of Children: N/A  . Years of Education: N/A   Occupational History  . Not on file.   Social History Main Topics  . Smoking status: Never Smoker   . Smokeless tobacco: Never Used  . Alcohol Use: No  . Drug Use: No  . Sexual Activity: Not on file   Other Topics Concern  . Not on file   Social History Narrative  . No narrative on file    Past Surgical History  Procedure Laterality Date  . Hysteroscopy     fibroids  . Laparoscopy      fibroids  . Cholecystectomy    . Hiatal hernia repair      Family History  Problem Relation Age of Onset  . Adopted: Yes  . Colon cancer Neg Hx   . Pancreatic cancer Neg Hx   . Rectal cancer Neg Hx   . Stomach cancer Neg Hx     Allergies  Allergen Reactions  . Statins Other (See Comments)    Muscle aches    Current Outpatient Prescriptions on File Prior to Visit  Medication Sig Dispense Refill  . aspirin 81 MG tablet Take 81 mg by mouth daily.        Marland Kitchen atenolol (TENORMIN) 25 MG tablet take 1 tablet by mouth once daily  90 tablet  1  . escitalopram (LEXAPRO) 20 MG tablet take 1 tablet by mouth once daily  90 tablet  3  . losartan (COZAAR) 100 MG tablet take 1 tablet by mouth once daily  30 tablet  5  . Multiple Vitamin (MULITIVITAMIN WITH MINERALS) TABS Take 1 tablet by mouth daily.      . nortriptyline (PAMELOR) 25 MG capsule take 1 capsule by mouth at bedtime  90 capsule  1  . OVER THE COUNTER MEDICATION Calcium citrate      . SYNTHROID 150 MCG tablet take 1 tablet by mouth once daily  90  tablet  1  . VITAMIN D, CHOLECALCIFEROL, PO Take 2,000 Units by mouth daily.       No current facility-administered medications on file prior to visit.    BP 136/70  Pulse 88  Temp(Src) 98.4 F (36.9 C) (Oral)  Ht 5' 5"  (1.651 m)  Wt 184 lb (83.462 kg)  BMI 30.62 kg/m2       Objective:   Physical Exam  Constitutional: She is oriented to person, place, and time. She appears well-developed and well-nourished. No distress.  HENT:  Head: Normocephalic and atraumatic.  Neck: Neck supple.  Left carotid bruit  Cardiovascular: Normal rate, regular rhythm and normal heart sounds.   No murmur heard. Pulmonary/Chest: Effort normal and breath sounds normal. She has no wheezes.  Abdominal: Soft. Bowel sounds are normal. There is no tenderness.  Musculoskeletal: She exhibits no edema.  Lymphadenopathy:    She has no cervical adenopathy.  Neurological:  She is alert and oriented to person, place, and time.  Skin: Skin is dry.  Psychiatric: She has a normal mood and affect. Her behavior is normal.       Assessment & Plan:

## 2013-08-14 NOTE — Assessment & Plan Note (Signed)
Patient completed duplex study on 07/13/2013. Findings suggest 1-39% internal carotid artery stenosis bilaterally. Continue baby aspirin therapy. She is statin intolerant.

## 2013-08-14 NOTE — Assessment & Plan Note (Signed)
Stable.  She has hx of Thalassemia minor

## 2013-08-22 ENCOUNTER — Other Ambulatory Visit: Payer: Self-pay | Admitting: Family Medicine

## 2013-08-31 ENCOUNTER — Telehealth: Payer: Self-pay

## 2013-08-31 ENCOUNTER — Telehealth: Payer: Self-pay | Admitting: Internal Medicine

## 2013-08-31 ENCOUNTER — Other Ambulatory Visit: Payer: Self-pay | Admitting: Endocrinology

## 2013-08-31 MED ORDER — LOSARTAN POTASSIUM 100 MG PO TABS: ORAL_TABLET | ORAL | Status: DC

## 2013-08-31 MED ORDER — SITAGLIP PHOS-METFORMIN HCL ER 50-1000 MG PO TB24: 1.0000 | ORAL_TABLET | Freq: Two times a day (BID) | ORAL | Status: DC

## 2013-08-31 NOTE — Telephone Encounter (Signed)
rx sent in electronically 

## 2013-08-31 NOTE — Telephone Encounter (Signed)
please call patient: Are you still taking the victoza?  If so, you don't need kombiglyze.  If not, it is good for you to take, and i will prescribe for you.

## 2013-08-31 NOTE — Telephone Encounter (Signed)
please call patient: Please change metformin and victoza to janumet.  i have sent a prescription to your pharmacy. i'll see you next time.

## 2013-08-31 NOTE — Telephone Encounter (Signed)
Received a refill request for Janumet XR 50-1,000. This med is not on pt's current medication list. Ok to refill? Thanks!

## 2013-08-31 NOTE — Telephone Encounter (Signed)
i'll go with whatever you say, as your blood sugar has been good.   Compared to the [place], the victoza helps slightly more on the blood sugar and weight loss.

## 2013-08-31 NOTE — Telephone Encounter (Signed)
[id number] [address], [place] [address] is requesting re-fill on losartan (COZAAR) 100 MG tablet

## 2013-08-31 NOTE — Telephone Encounter (Signed)
Called pt and she states that Dr. Shawna Orleans thought the metformin and victoza would help her loose weight and be a better medication regimun. Pt states that she would like for you and Dr. Shawna Orleans to discuss this. Pt would like to know why the janument is a better option than the metformin and victoza.  Please advise, Thanks!

## 2013-08-31 NOTE — Telephone Encounter (Signed)
Pt states that she has not ever taken Victoza. She states that she is currently taking Janumet and and Acots. Pt states that Dr. Shawna Orleans put her on Victoza and she states she wanted to check and see what you advised before starting this new medication. Please advise, Thanks!

## 2013-09-01 NOTE — Telephone Encounter (Signed)
Pt's husband informed. He states his wife and spoke with him about the subject. And She will stay on the Victoza and Metformin.

## 2013-09-07 ENCOUNTER — Telehealth: Payer: Self-pay | Admitting: Internal Medicine

## 2013-09-07 NOTE — Telephone Encounter (Signed)
Pt would like to know if she should continue to take pioglitazone (ACTOS) 15 MG tablet Pt states her meds were changed and she is not sure of her meds. pls advise

## 2013-09-08 NOTE — Telephone Encounter (Signed)
No.  She should discontinue Actos.  See my note on problem list for type II diabetes.

## 2013-09-08 NOTE — Telephone Encounter (Signed)
Pls advise.  

## 2013-09-08 NOTE — Telephone Encounter (Signed)
Called and spoke with pt and pt is aware.  

## 2013-09-21 ENCOUNTER — Encounter: Payer: Self-pay | Admitting: Internal Medicine

## 2013-09-21 ENCOUNTER — Ambulatory Visit (INDEPENDENT_AMBULATORY_CARE_PROVIDER_SITE_OTHER): Payer: Medicare Other | Admitting: Internal Medicine

## 2013-09-21 VITALS — BP 168/98 | HR 64 | Temp 98.8°F | Ht 65.0 in | Wt 181.0 lb

## 2013-09-21 DIAGNOSIS — I10 Essential (primary) hypertension: Secondary | ICD-10-CM | POA: Diagnosis not present

## 2013-09-21 DIAGNOSIS — E039 Hypothyroidism, unspecified: Secondary | ICD-10-CM | POA: Diagnosis not present

## 2013-09-21 DIAGNOSIS — I6529 Occlusion and stenosis of unspecified carotid artery: Secondary | ICD-10-CM

## 2013-09-21 DIAGNOSIS — E119 Type 2 diabetes mellitus without complications: Secondary | ICD-10-CM | POA: Diagnosis not present

## 2013-09-21 MED ORDER — METOPROLOL SUCCINATE ER 50 MG PO TB24: 50.0000 mg | ORAL_TABLET | Freq: Every day | ORAL | Status: DC

## 2013-09-21 MED ORDER — VALSARTAN 160 MG PO TABS: 160.0000 mg | ORAL_TABLET | Freq: Every day | ORAL | Status: DC

## 2013-09-21 MED ORDER — METFORMIN HCL ER (OSM) 1000 MG PO TB24: 1000.0000 mg | ORAL_TABLET | Freq: Two times a day (BID) | ORAL | Status: DC

## 2013-09-21 MED ORDER — LIRAGLUTIDE 18 MG/3ML ~~LOC~~ SOPN: 1.2000 mg | PEN_INJECTOR | Freq: Every day | SUBCUTANEOUS | Status: DC

## 2013-09-21 NOTE — Assessment & Plan Note (Signed)
BP poorly controlled.  DC losartan and atenolol.  Switch to Valsartan 160 mg and Metoprolol XL 50 mg once daily.  BP: 168/98 mmHg

## 2013-09-21 NOTE — Assessment & Plan Note (Signed)
Patient tolerating Victoza well.  Increase Fortamet to 1000 mg bid.  Monitor A1c before next office visit.

## 2013-09-21 NOTE — Progress Notes (Signed)
Subjective:    Patient ID: Breanna Webster, female    DOB: 02-Oct-1945, 68 y.o.   MRN: [id number]  HPI  68 year old white female with history of type 2 diabetes, hypertension and hyperlipidemia for followup. At previous visit patient's medications adjusted. He discontinue Januvia and Actos. She was switched to Victoza. She is currently taking 1.2 mg subcutaneous once daily. There has been mild weight loss. Patient reports her blood sugars are stable.  She is taking metformin 1000 mg once daily.  Hypertension-she reports good medication compliance. Her blood pressure is elevated today. She does not monitor her blood pressure at home. She is to have her wrist BP cuff but finds it unreliable.  Review of Systems Mild constipation,  No significant nausea    Past Medical History  Diagnosis Date  . Diabetes mellitus   . GERD (gastroesophageal reflux disease)   . Grave's disease   . Hyperlipidemia   . Osteopenia   . Hypertension   . Carotid artery stenosis     Mild  . Thalassemia minor     History   Social History  . Marital Status: Married    Spouse Name: N/A    Number of Children: N/A  . Years of Education: N/A   Occupational History  . Not on file.   Social History Main Topics  . Smoking status: Never Smoker   . Smokeless tobacco: Never Used  . Alcohol Use: No  . Drug Use: No  . Sexual Activity: Not on file   Other Topics Concern  . Not on file   Social History Narrative  . No narrative on file    Past Surgical History  Procedure Laterality Date  . Hysteroscopy      fibroids  . Laparoscopy      fibroids  . Cholecystectomy    . Hiatal hernia repair      Family History  Problem Relation Age of Onset  . Adopted: Yes  . Colon cancer Neg Hx   . Pancreatic cancer Neg Hx   . Rectal cancer Neg Hx   . Stomach cancer Neg Hx     Allergies  Allergen Reactions  . Statins Other (See Comments)    Muscle aches    Current Outpatient Prescriptions on File Prior  to Visit  Medication Sig Dispense Refill  . aspirin 81 MG tablet Take 81 mg by mouth daily.        Marland Kitchen escitalopram (LEXAPRO) 20 MG tablet take 1 tablet by mouth once daily  90 tablet  3  . Insulin Pen Needle (CLICKFINE PEN NEEDLES) 31G X 8 MM MISC Use once daily as directed.  100 each  1  . Multiple Vitamin (MULITIVITAMIN WITH MINERALS) TABS Take 1 tablet by mouth daily.      . nortriptyline (PAMELOR) 25 MG capsule take 1 capsule by mouth at bedtime  90 capsule  1  . OVER THE COUNTER MEDICATION Calcium citrate      . SYNTHROID 150 MCG tablet take 1 tablet by mouth once daily  90 tablet  1  . VITAMIN D, CHOLECALCIFEROL, PO Take 2,000 Units by mouth daily.       No current facility-administered medications on file prior to visit.    BP 168/98  Pulse 64  Temp(Src) 98.8 F (37.1 C) (Oral)  Ht 5' 5"  (1.651 m)  Wt 181 lb (82.101 kg)  BMI 30.12 kg/m2    Objective:   Physical Exam  Constitutional: She is oriented to person, place, and  time. She appears well-developed and well-nourished. No distress.  Neck: Neck supple.  No carotid bruit  Cardiovascular: Normal rate, regular rhythm and normal heart sounds.   Pulmonary/Chest: Effort normal and breath sounds normal. She has no wheezes.  Neurological: She is alert and oriented to person, place, and time. No cranial nerve deficit.       Assessment & Plan:

## 2013-09-21 NOTE — Assessment & Plan Note (Signed)
Stable.  Repeat TFTs in 01/2014 Lab Results  Component Value Date   TSH 0.52 01/22/2013

## 2013-09-21 NOTE — Patient Instructions (Signed)
Please complete the following lab tests before your next follow up appointment: BMET - 401.9 A1c - 250.00

## 2013-09-21 NOTE — Progress Notes (Signed)
Pre visit review using our clinic review tool, if applicable. No additional management support is needed unless otherwise documented below in the visit note. 

## 2013-09-25 ENCOUNTER — Other Ambulatory Visit: Payer: Self-pay | Admitting: *Deleted

## 2013-09-28 ENCOUNTER — Encounter: Payer: Self-pay | Admitting: Internal Medicine

## 2013-09-28 ENCOUNTER — Ambulatory Visit (INDEPENDENT_AMBULATORY_CARE_PROVIDER_SITE_OTHER): Payer: Medicare Other | Admitting: Internal Medicine

## 2013-09-28 VITALS — BP 126/80 | HR 84 | Temp 98.5°F | Ht 65.0 in | Wt 180.0 lb

## 2013-09-28 DIAGNOSIS — I10 Essential (primary) hypertension: Secondary | ICD-10-CM | POA: Diagnosis not present

## 2013-09-28 DIAGNOSIS — I6529 Occlusion and stenosis of unspecified carotid artery: Secondary | ICD-10-CM

## 2013-09-28 MED ORDER — METOPROLOL SUCCINATE ER 100 MG PO TB24: 100.0000 mg | ORAL_TABLET | Freq: Every day | ORAL | Status: DC

## 2013-09-28 NOTE — Progress Notes (Signed)
Subjective:    Patient ID: Breanna Webster, female    DOB: 03/05/1946, 68 y.o.   MRN: [id number]  Hypertension  68 year old white female with history of type 2 diabetes and hypertension for followup. She was seen one week ago and was found to have suboptimally controlled hypertension. Her blood pressure medication was changed from losartan 100 mg to valsartan 160 mg.  Her atenolol was also discontinued and switch to Toprol-XL 50 mg once daily.  Over last couple days patient has noticed mild headache. She has been monitoring her blood pressure at home with automated wrist blood pressure cuff. Her diastolic readings have been in the low 90s.   Review of Systems Mild headache. Negative for chest pain    Past Medical History  Diagnosis Date  . Diabetes mellitus   . GERD (gastroesophageal reflux disease)   . Grave's disease   . Hyperlipidemia   . Osteopenia   . Hypertension   . Carotid artery stenosis     Mild  . Thalassemia minor     History   Social History  . Marital Status: Married    Spouse Name: N/A    Number of Children: N/A  . Years of Education: N/A   Occupational History  . Not on file.   Social History Main Topics  . Smoking status: Never Smoker   . Smokeless tobacco: Never Used  . Alcohol Use: No  . Drug Use: No  . Sexual Activity: Not on file   Other Topics Concern  . Not on file   Social History Narrative  . No narrative on file    Past Surgical History  Procedure Laterality Date  . Hysteroscopy      fibroids  . Laparoscopy      fibroids  . Cholecystectomy    . Hiatal hernia repair      Family History  Problem Relation Age of Onset  . Adopted: Yes  . Colon cancer Neg Hx   . Pancreatic cancer Neg Hx   . Rectal cancer Neg Hx   . Stomach cancer Neg Hx     Allergies  Allergen Reactions  . Statins Other (See Comments)    Muscle aches    Current Outpatient Prescriptions on File Prior to Visit  Medication Sig Dispense Refill  .  aspirin 81 MG tablet Take 81 mg by mouth daily.        Marland Kitchen escitalopram (LEXAPRO) 20 MG tablet take 1 tablet by mouth once daily  90 tablet  3  . Insulin Pen Needle (CLICKFINE PEN NEEDLES) 31G X 8 MM MISC Use once daily as directed.  100 each  1  . Liraglutide (VICTOZA) 18 MG/3ML SOPN Inject 1.2 mg into the skin daily.  6 mL  5  . metformin (FORTAMET) 1000 MG (OSM) 24 hr tablet Take 1 tablet (1,000 mg total) by mouth 2 (two) times daily with a meal.  180 tablet  1  . Multiple Vitamin (MULITIVITAMIN WITH MINERALS) TABS Take 1 tablet by mouth daily.      . nortriptyline (PAMELOR) 25 MG capsule take 1 capsule by mouth at bedtime  90 capsule  1  . OVER THE COUNTER MEDICATION Calcium citrate      . SYNTHROID 150 MCG tablet take 1 tablet by mouth once daily  90 tablet  1  . valsartan (DIOVAN) 160 MG tablet Take 1 tablet (160 mg total) by mouth daily.  90 tablet  1  . VITAMIN D, CHOLECALCIFEROL, PO Take 2,000 Units  by mouth daily.       No current facility-administered medications on file prior to visit.    BP 126/80  Pulse 84  Temp(Src) 98.5 F (36.9 C) (Oral)  Ht 5' 5"  (1.651 m)  Wt 180 lb (81.647 kg)  BMI 29.95 kg/m2    Objective:   Physical Exam  Constitutional: She is oriented to person, place, and time. She appears well-developed and well-nourished. No distress.  Cardiovascular: Normal rate, regular rhythm and normal heart sounds.   No murmur heard. Pulmonary/Chest: Effort normal and breath sounds normal. She has no wheezes.  Musculoskeletal: She exhibits no edema.  Neurological: She is alert and oriented to person, place, and time. No cranial nerve deficit.  Psychiatric: She has a normal mood and affect. Her behavior is normal.          Assessment & Plan:

## 2013-09-28 NOTE — Progress Notes (Signed)
Pre visit review using our clinic review tool, if applicable. No additional management support is needed unless otherwise documented below in the visit note. 

## 2013-09-28 NOTE — Assessment & Plan Note (Signed)
Blood pressure control is improving. Her heart rate is elevated since switching from atenolol 25 mg to metoprolol XL 50 mg. Increase the Toprol-XL to 100 mg. Continue to monitor blood pressure at home. Reassess in one month. BP with manual cuff 138/78 in both left and right arm

## 2013-10-01 ENCOUNTER — Encounter: Payer: Self-pay | Admitting: Internal Medicine

## 2013-10-09 ENCOUNTER — Other Ambulatory Visit (INDEPENDENT_AMBULATORY_CARE_PROVIDER_SITE_OTHER): Payer: Medicare Other

## 2013-10-09 DIAGNOSIS — E119 Type 2 diabetes mellitus without complications: Secondary | ICD-10-CM

## 2013-10-09 LAB — BASIC METABOLIC PANEL
BUN: 18 mg/dL (ref 6–23)
CO2: 26 mEq/L (ref 19–32)
Calcium: 9.5 mg/dL (ref 8.4–10.5)
Chloride: 104 mEq/L (ref 96–112)
Creatinine, Ser: 0.6 mg/dL (ref 0.4–1.2)
GFR: 107.13 mL/min (ref >60.00)
Glucose, Bld: 127 mg/dL — ABNORMAL HIGH (ref 70–99)
Potassium: 4.1 mEq/L (ref 3.5–5.1)
Sodium: 140 mEq/L (ref 135–145)

## 2013-10-09 LAB — HEMOGLOBIN A1C: Hgb A1c MFr Bld: 6.3 % (ref 4.6–6.5)

## 2013-10-15 ENCOUNTER — Encounter: Payer: Self-pay | Admitting: Internal Medicine

## 2013-10-19 ENCOUNTER — Ambulatory Visit (INDEPENDENT_AMBULATORY_CARE_PROVIDER_SITE_OTHER): Payer: Medicare Other | Admitting: Internal Medicine

## 2013-10-19 ENCOUNTER — Encounter: Payer: Self-pay | Admitting: Internal Medicine

## 2013-10-19 VITALS — BP 132/72 | HR 72 | Temp 99.0°F | Ht 65.0 in | Wt 181.0 lb

## 2013-10-19 DIAGNOSIS — I6529 Occlusion and stenosis of unspecified carotid artery: Secondary | ICD-10-CM | POA: Diagnosis not present

## 2013-10-19 DIAGNOSIS — I10 Essential (primary) hypertension: Secondary | ICD-10-CM | POA: Diagnosis not present

## 2013-10-19 DIAGNOSIS — E119 Type 2 diabetes mellitus without complications: Secondary | ICD-10-CM | POA: Diagnosis not present

## 2013-10-19 MED ORDER — METFORMIN HCL 500 MG PO TABS: 500.0000 mg | ORAL_TABLET | Freq: Three times a day (TID) | ORAL | Status: DC

## 2013-10-19 NOTE — Assessment & Plan Note (Signed)
Mild weight loss with Victoza.  A1c is stable.  Patient also encouraged to restart regular exercise regimen. Reassess in 3 months. Lab Results  Component Value Date   HGBA1C 6.3 10/09/2013

## 2013-10-19 NOTE — Progress Notes (Signed)
Pre visit review using our clinic review tool, if applicable. No additional management support is needed unless otherwise documented below in the visit note. 

## 2013-10-19 NOTE — Progress Notes (Signed)
Subjective:    Patient ID: Breanna Webster, female    DOB: 02-17-1946, 68 y.o.   MRN: [id number]  HPI  68 year old white female with history of type 2 diabetes and hypertension for routine followup. Patient currently taking Victoza and metformin ER. Patient has lost more than 5 pounds since changing her diabetes medication regimen. She was previously on Januvia and Actos.  Patient had localized reaction to Victoza injection within the past week. It has since resolved. She denies any other side effects.  Hypertension-blood pressure readings improved with fell started in 160 mg and metoprolol XL 100 mg.  Review of Systems Negative for chest pain    Past Medical History  Diagnosis Date  . Diabetes mellitus   . GERD (gastroesophageal reflux disease)   . Grave's disease   . Hyperlipidemia   . Osteopenia   . Hypertension   . Carotid artery stenosis     Mild  . Thalassemia minor     History   Social History  . Marital Status: Married    Spouse Name: N/A    Number of Children: N/A  . Years of Education: N/A   Occupational History  . Not on file.   Social History Main Topics  . Smoking status: Never Smoker   . Smokeless tobacco: Never Used  . Alcohol Use: No  . Drug Use: No  . Sexual Activity: Not on file   Other Topics Concern  . Not on file   Social History Narrative  . No narrative on file    Past Surgical History  Procedure Laterality Date  . Hysteroscopy      fibroids  . Laparoscopy      fibroids  . Cholecystectomy    . Hiatal hernia repair      Family History  Problem Relation Age of Onset  . Adopted: Yes  . Colon cancer Neg Hx   . Pancreatic cancer Neg Hx   . Rectal cancer Neg Hx   . Stomach cancer Neg Hx     Allergies  Allergen Reactions  . Statins Other (See Comments)    Muscle aches    Current Outpatient Prescriptions on File Prior to Visit  Medication Sig Dispense Refill  . aspirin 81 MG tablet Take 81 mg by mouth daily.        Marland Kitchen  escitalopram (LEXAPRO) 20 MG tablet take 1 tablet by mouth once daily  90 tablet  3  . Insulin Pen Needle (CLICKFINE PEN NEEDLES) 31G X 8 MM MISC Use once daily as directed.  100 each  1  . Liraglutide (VICTOZA) 18 MG/3ML SOPN Inject 1.2 mg into the skin daily.  6 mL  5  . metformin (FORTAMET) 1000 MG (OSM) 24 hr tablet Take 1 tablet (1,000 mg total) by mouth 2 (two) times daily with a meal.  180 tablet  1  . metoprolol succinate (TOPROL-XL) 100 MG 24 hr tablet Take 1 tablet (100 mg total) by mouth daily. Take with or immediately following a meal.  90 tablet  1  . Multiple Vitamin (MULITIVITAMIN WITH MINERALS) TABS Take 1 tablet by mouth daily.      . nortriptyline (PAMELOR) 25 MG capsule take 1 capsule by mouth at bedtime  90 capsule  1  . OVER THE COUNTER MEDICATION Calcium citrate      . SYNTHROID 150 MCG tablet take 1 tablet by mouth once daily  90 tablet  1  . valsartan (DIOVAN) 160 MG tablet Take 1 tablet (160 mg  total) by mouth daily.  90 tablet  1  . VITAMIN D, CHOLECALCIFEROL, PO Take 2,000 Units by mouth daily.       No current facility-administered medications on file prior to visit.    BP 132/72  Pulse 72  Temp(Src) 99 F (37.2 C) (Oral)  Ht 5' 5"  (1.651 m)  Wt 181 lb (82.101 kg)  BMI 30.12 kg/m2    Objective:   Physical Exam  Constitutional: She is oriented to person, place, and time. She appears well-developed and well-nourished. No distress.  HENT:  Head: Normocephalic and atraumatic.  Cardiovascular: Normal rate and regular rhythm.   Faint systolic ejection murmur left sternal border  Pulmonary/Chest: Effort normal and breath sounds normal. She has no wheezes.  Musculoskeletal: She exhibits no edema.  Neurological: She is alert and oriented to person, place, and time. No cranial nerve deficit.  Psychiatric: She has a normal mood and affect. Her behavior is normal.        Assessment & Plan:

## 2013-10-19 NOTE — Patient Instructions (Signed)
Please complete the following lab tests before your next follow up appointment: BMET, A1c, Microalb / Cr ratio - 250.00

## 2013-10-19 NOTE — Assessment & Plan Note (Signed)
Improved.  Continue current medication regimen. BP: 132/72 mmHg  Lab Results  Component Value Date   CREATININE 0.6 10/09/2013   Lab Results  Component Value Date   NA 140 10/09/2013   K 4.1 10/09/2013   CL 104 10/09/2013   CO2 26 10/09/2013

## 2013-10-26 ENCOUNTER — Ambulatory Visit: Payer: Medicare Other | Admitting: Internal Medicine

## 2013-11-23 ENCOUNTER — Other Ambulatory Visit: Payer: Self-pay | Admitting: Family Medicine

## 2013-11-23 NOTE — Telephone Encounter (Signed)
Med filled due to pt having a new pt appt with tabori in sept.

## 2013-12-10 DIAGNOSIS — N952 Postmenopausal atrophic vaginitis: Secondary | ICD-10-CM | POA: Diagnosis not present

## 2013-12-10 DIAGNOSIS — B373 Candidiasis of vulva and vagina: Secondary | ICD-10-CM | POA: Diagnosis not present

## 2013-12-10 DIAGNOSIS — B3731 Acute candidiasis of vulva and vagina: Secondary | ICD-10-CM | POA: Diagnosis not present

## 2013-12-10 DIAGNOSIS — R300 Dysuria: Secondary | ICD-10-CM | POA: Diagnosis not present

## 2013-12-10 DIAGNOSIS — N899 Noninflammatory disorder of vagina, unspecified: Secondary | ICD-10-CM | POA: Diagnosis not present

## 2013-12-22 ENCOUNTER — Telehealth: Payer: Self-pay | Admitting: Internal Medicine

## 2013-12-22 NOTE — Telephone Encounter (Signed)
Pharm needs clarification on metoprolol er. Pharm  has 2 different rxs on file 100 mg and 50 mg

## 2013-12-22 NOTE — Telephone Encounter (Signed)
Pharmacy notified.

## 2013-12-29 ENCOUNTER — Other Ambulatory Visit: Payer: Self-pay | Admitting: Family Medicine

## 2013-12-29 NOTE — Telephone Encounter (Signed)
Last filled:  06/29/13 Amt filled: 90 tablets, 1 refill Last TSH:  01/22/13  DUE  Med filled x 1 month. Medicare Wellness Exam scheduled with Dr. Birdie Riddle on 02/01/14 @ 10:30 am.

## 2013-12-30 ENCOUNTER — Other Ambulatory Visit: Payer: Self-pay | Admitting: *Deleted

## 2013-12-30 MED ORDER — INSULIN PEN NEEDLE 31G X 8 MM MISC: Status: DC

## 2014-01-06 DIAGNOSIS — H2510 Age-related nuclear cataract, unspecified eye: Secondary | ICD-10-CM | POA: Diagnosis not present

## 2014-01-06 DIAGNOSIS — E119 Type 2 diabetes mellitus without complications: Secondary | ICD-10-CM | POA: Diagnosis not present

## 2014-01-06 LAB — HM DIABETES EYE EXAM

## 2014-01-23 ENCOUNTER — Other Ambulatory Visit: Payer: Self-pay | Admitting: Family Medicine

## 2014-01-28 DIAGNOSIS — Z23 Encounter for immunization: Secondary | ICD-10-CM | POA: Diagnosis not present

## 2014-01-29 ENCOUNTER — Encounter: Payer: Self-pay | Admitting: Internal Medicine

## 2014-02-01 ENCOUNTER — Encounter: Payer: Medicare Other | Admitting: Family Medicine

## 2014-02-08 ENCOUNTER — Other Ambulatory Visit (INDEPENDENT_AMBULATORY_CARE_PROVIDER_SITE_OTHER): Payer: Medicare Other

## 2014-02-08 ENCOUNTER — Other Ambulatory Visit: Payer: Self-pay | Admitting: *Deleted

## 2014-02-08 DIAGNOSIS — E119 Type 2 diabetes mellitus without complications: Secondary | ICD-10-CM | POA: Diagnosis not present

## 2014-02-08 DIAGNOSIS — I10 Essential (primary) hypertension: Secondary | ICD-10-CM | POA: Diagnosis not present

## 2014-02-08 LAB — BASIC METABOLIC PANEL
BUN: 15 mg/dL (ref 6–23)
CO2: 22 mEq/L (ref 19–32)
Calcium: 9.3 mg/dL (ref 8.4–10.5)
Chloride: 101 mEq/L (ref 96–112)
Creatinine, Ser: 0.7 mg/dL (ref 0.4–1.2)
GFR: 89.34 mL/min (ref >60.00)
Glucose, Bld: 191 mg/dL — ABNORMAL HIGH (ref 70–99)
Potassium: 4 mEq/L (ref 3.5–5.1)
Sodium: 139 mEq/L (ref 135–145)

## 2014-02-08 LAB — MICROALBUMIN / CREATININE URINE RATIO
Creatinine,U: 111.3 mg/dL
Microalb Creat Ratio: 1 mg/g (ref 0.0–30.0)
Microalb, Ur: 1.1 mg/dL (ref 0.0–1.9)

## 2014-02-08 LAB — HEMOGLOBIN A1C: Hgb A1c MFr Bld: 6.4 % (ref 4.6–6.5)

## 2014-02-08 MED ORDER — SYNTHROID 150 MCG PO TABS: ORAL_TABLET | ORAL | Status: DC

## 2014-02-16 ENCOUNTER — Ambulatory Visit: Payer: Medicare Other | Admitting: Internal Medicine

## 2014-02-17 ENCOUNTER — Ambulatory Visit (INDEPENDENT_AMBULATORY_CARE_PROVIDER_SITE_OTHER): Payer: Medicare Other | Admitting: Internal Medicine

## 2014-02-17 ENCOUNTER — Encounter: Payer: Self-pay | Admitting: Internal Medicine

## 2014-02-17 VITALS — BP 132/70 | Temp 100.0°F | Wt 181.0 lb

## 2014-02-17 DIAGNOSIS — E119 Type 2 diabetes mellitus without complications: Secondary | ICD-10-CM | POA: Diagnosis not present

## 2014-02-17 DIAGNOSIS — M791 Myalgia, unspecified site: Secondary | ICD-10-CM

## 2014-02-17 DIAGNOSIS — R197 Diarrhea, unspecified: Secondary | ICD-10-CM | POA: Diagnosis not present

## 2014-02-17 DIAGNOSIS — I6529 Occlusion and stenosis of unspecified carotid artery: Secondary | ICD-10-CM

## 2014-02-17 DIAGNOSIS — R509 Fever, unspecified: Secondary | ICD-10-CM | POA: Diagnosis not present

## 2014-02-17 LAB — HEPATIC FUNCTION PANEL
ALT: 26 U/L (ref 0–35)
AST: 22 U/L (ref 0–37)
Albumin: 4.3 g/dL (ref 3.5–5.2)
Alkaline Phosphatase: 72 U/L (ref 39–117)
Bilirubin, Direct: 0.2 mg/dL (ref 0.0–0.3)
Indirect Bilirubin: 0.5 mg/dL (ref 0.2–1.2)
Total Bilirubin: 0.7 mg/dL (ref 0.2–1.2)
Total Protein: 7.4 g/dL (ref 6.0–8.3)

## 2014-02-17 LAB — POCT INFLUENZA A/B
Influenza A, POC: NEGATIVE
Influenza B, POC: NEGATIVE

## 2014-02-17 LAB — CK: Total CK: 24 U/L (ref 7–177)

## 2014-02-17 NOTE — Assessment & Plan Note (Addendum)
68 year old white female complains of diffuse myalgia. Her symptoms likely secondary to her viral infection. Rule out rhabdomyolysis. Obtain CPK and sedimentation rate.  Increase hydration.

## 2014-02-17 NOTE — Progress Notes (Signed)
Pre visit review using our clinic review tool, if applicable. No additional management support is needed unless otherwise documented below in the visit note. 

## 2014-02-17 NOTE — Patient Instructions (Signed)
Hold metformin until your diarrheal illness has resolved. Start BRAT diet Increase fluid intake

## 2014-02-17 NOTE — Progress Notes (Signed)
Subjective:    Patient ID: Breanna Webster, female    DOB: 07-Aug-1945, 68 y.o.   MRN: [id number]  HPI  68 year old white female with history of type 2 diabetes, hypertension and hyperlipidemia for routine follow-up.  Patient complains of low-grade fever and diffuse muscle aches x 1 week. Patient also complains of significant diarrhea over last 3-4 days. She describes explosive watery loose stools 3 times per day. She has not noticed any blood in her stools or mucus.  This is out of proportion to her hx of IBS.  Patient reports her diffuse muscle aches that feel similar to when she was previously on statin medication.  She denies any recent travel or unusual food intake. She also has not used any antibiotics recently.  Type 2 diabetes-stable   Review of Systems Low grade fever, diffuse muscle aches  Past Medical History  Diagnosis Date  . Diabetes mellitus   . GERD (gastroesophageal reflux disease)   . Grave's disease   . Hyperlipidemia   . Osteopenia   . Hypertension   . Carotid artery stenosis     Mild  . Thalassemia minor     History   Social History  . Marital Status: Married    Spouse Name: N/A    Number of Children: N/A  . Years of Education: N/A   Occupational History  . Not on file.   Social History Main Topics  . Smoking status: Never Smoker   . Smokeless tobacco: Never Used  . Alcohol Use: No  . Drug Use: No  . Sexual Activity: Not on file   Other Topics Concern  . Not on file   Social History Narrative  . No narrative on file    Past Surgical History  Procedure Laterality Date  . Hysteroscopy      fibroids  . Laparoscopy      fibroids  . Cholecystectomy    . Hiatal hernia repair      Family History  Problem Relation Age of Onset  . Adopted: Yes  . Colon cancer Neg Hx   . Pancreatic cancer Neg Hx   . Rectal cancer Neg Hx   . Stomach cancer Neg Hx     Allergies  Allergen Reactions  . Statins Other (See Comments)    Muscle  aches    Current Outpatient Prescriptions on File Prior to Visit  Medication Sig Dispense Refill  . aspirin 81 MG tablet Take 81 mg by mouth daily.        Marland Kitchen escitalopram (LEXAPRO) 20 MG tablet take 1 tablet by mouth once daily  90 tablet  3  . Insulin Pen Needle (CLICKFINE PEN NEEDLES) 31G X 8 MM MISC Use once daily as directed.  100 each  1  . Liraglutide (VICTOZA) 18 MG/3ML SOPN Inject 1.2 mg into the skin daily.  6 mL  5  . metFORMIN (GLUCOPHAGE) 500 MG tablet Take 1 tablet (500 mg total) by mouth 3 (three) times daily with meals.  270 tablet  1  . metoprolol succinate (TOPROL-XL) 100 MG 24 hr tablet Take 1 tablet (100 mg total) by mouth daily. Take with or immediately following a meal.  90 tablet  1  . Multiple Vitamin (MULITIVITAMIN WITH MINERALS) TABS Take 1 tablet by mouth daily.      . nortriptyline (PAMELOR) 25 MG capsule take 1 capsule by mouth at bedtime  90 capsule  1  . OVER THE COUNTER MEDICATION Calcium citrate      .  SYNTHROID 150 MCG tablet take 1 tablet by mouth once daily  90 tablet  0  . valsartan (DIOVAN) 160 MG tablet Take 1 tablet (160 mg total) by mouth daily.  90 tablet  1  . VITAMIN D, CHOLECALCIFEROL, PO Take 2,000 Units by mouth daily.       No current facility-administered medications on file prior to visit.    BP 132/70  Temp(Src) 100 F (37.8 C) (Oral)  Wt 181 lb (82.101 kg)       Objective:   Physical Exam  Constitutional: She is oriented to person, place, and time. She appears well-developed and well-nourished.  HENT:  Head: Normocephalic and atraumatic.  Cardiovascular: Normal rate, regular rhythm and normal heart sounds.   No murmur heard. Pulmonary/Chest: Effort normal. She has no wheezes.  Abdominal: Soft. She exhibits no mass. There is no rebound and no guarding.  Mild distention, mild diffuse tenderness  Musculoskeletal: She exhibits no edema.  Neurological: She is alert and oriented to person, place, and time. No cranial nerve deficit.    Psychiatric: She has a normal mood and affect. Her behavior is normal.          Assessment & Plan:

## 2014-02-17 NOTE — Assessment & Plan Note (Signed)
Patient has diffuse muscle aches, diarrhea and low-grade fever. I suspect she has possible viral gastroenteritis. Rule out C. Difficile colitis. Patient advised to increase hydration and follow BRAT diet for now.  Check CBCD

## 2014-02-17 NOTE — Assessment & Plan Note (Signed)
Patient's blood sugars are stable. Patient advised to hold her metformin dose until her gastrointestinal issues resolved. Lab Results  Component Value Date   HGBA1C 6.4 02/08/2014   Lab Results  Component Value Date   CREATININE 0.7 02/08/2014  '

## 2014-02-18 LAB — CBC WITH DIFFERENTIAL/PLATELET
Basophils Absolute: 0 10*3/uL (ref 0.0–0.1)
Basophils Relative: 0 % (ref 0–1)
Eosinophils Absolute: 0.1 10*3/uL (ref 0.0–0.7)
Eosinophils Relative: 1 % (ref 0–5)
HCT: 35.2 % — ABNORMAL LOW (ref 36.0–46.0)
Hemoglobin: 11.6 g/dL — ABNORMAL LOW (ref 12.0–15.0)
Lymphocytes Relative: 10 % — ABNORMAL LOW (ref 12–46)
Lymphs Abs: 1.5 10*3/uL (ref 0.7–4.0)
MCH: 19.8 pg — ABNORMAL LOW (ref 26.0–34.0)
MCHC: 33 g/dL (ref 30.0–36.0)
MCV: 60.2 fL — ABNORMAL LOW (ref 78.0–100.0)
Monocytes Absolute: 0.7 10*3/uL (ref 0.1–1.0)
Monocytes Relative: 5 % (ref 3–12)
Neutro Abs: 12.4 10*3/uL — ABNORMAL HIGH (ref 1.7–7.7)
Neutrophils Relative %: 84 % — ABNORMAL HIGH (ref 43–77)
Platelets: 399 10*3/uL (ref 150–400)
RBC: 5.85 MIL/uL — ABNORMAL HIGH (ref 3.87–5.11)
RDW: 15.4 % (ref 11.5–15.5)
WBC: 14.8 10*3/uL — ABNORMAL HIGH (ref 4.0–10.5)

## 2014-02-18 LAB — SEDIMENTATION RATE: Sed Rate: 20 mm/hr (ref 0–22)

## 2014-02-19 ENCOUNTER — Other Ambulatory Visit: Payer: Self-pay | Admitting: Internal Medicine

## 2014-02-19 LAB — CLOSTRIDIUM DIFFICILE BY PCR: Toxigenic C. Difficile by PCR: NOT DETECTED

## 2014-02-19 MED ORDER — CIPROFLOXACIN HCL 500 MG PO TABS: 500.0000 mg | ORAL_TABLET | Freq: Two times a day (BID) | ORAL | Status: DC

## 2014-02-19 MED ORDER — METRONIDAZOLE 500 MG PO TABS: 500.0000 mg | ORAL_TABLET | Freq: Three times a day (TID) | ORAL | Status: DC

## 2014-02-24 ENCOUNTER — Encounter: Payer: Self-pay | Admitting: Internal Medicine

## 2014-02-24 ENCOUNTER — Ambulatory Visit (INDEPENDENT_AMBULATORY_CARE_PROVIDER_SITE_OTHER): Payer: Medicare Other | Admitting: Internal Medicine

## 2014-02-24 VITALS — BP 136/84 | Temp 97.7°F | Ht 65.0 in | Wt 181.0 lb

## 2014-02-24 DIAGNOSIS — I6529 Occlusion and stenosis of unspecified carotid artery: Secondary | ICD-10-CM

## 2014-02-24 DIAGNOSIS — E119 Type 2 diabetes mellitus without complications: Secondary | ICD-10-CM | POA: Diagnosis not present

## 2014-02-24 DIAGNOSIS — R197 Diarrhea, unspecified: Secondary | ICD-10-CM

## 2014-02-24 MED ORDER — COLESEVELAM HCL 625 MG PO TABS
1250.0000 mg | ORAL_TABLET | Freq: Two times a day (BID) | ORAL | Status: DC
Start: 2014-02-24 — End: 2014-03-21

## 2014-02-24 NOTE — Assessment & Plan Note (Addendum)
Patient's recent CBC with differential showed slightly elevated white count. Considering low-grade fever and left lower quadrant discomfort she was treated for possible lymph diverticulitis. Unfortunately no improvement with Cipro and Metronidazole.  C Diff by PCR was negative.  Unclear whether this is persistent viral gastroenteritis , IBS flareup versus other.  DC antibiotics.  Hold metformin.  Hold Victoza. Trial of Welchol  Check stool lactoferrin and repeat testing for C Diff.  Refer to GI - Dr. Deatra Ina for further evaluation.

## 2014-02-24 NOTE — Progress Notes (Signed)
Subjective:    Patient ID: Breanna Webster, female    DOB: 11-18-1945, 68 y.o.   MRN: [id number]  HPI  68 year old white female previously seen for low-grade fever, diffuse muscle aches and explosive diarrhea for follow-up. Her CBCD showed slightly elevated white blood count. There is concern for possible diverticulitis. She was empirically treated with ciprofloxacin 500 mg twice daily and metronidazole 500 mg 3 times a day.  Her fevers have resolved. Unfortunately she has persistent diarrhea. She reports for bowel movement per day. They are green in color.  She has history of IBS. Her previous IBS flareups has not cause similar issues with diarrhea.  Review of Systems Negative for fever,  She recently restarted metformin    Past Medical History  Diagnosis Date  . Diabetes mellitus   . GERD (gastroesophageal reflux disease)   . Grave's disease   . Hyperlipidemia   . Osteopenia   . Hypertension   . Carotid artery stenosis     Mild  . Thalassemia minor     History   Social History  . Marital Status: Married    Spouse Name: N/A    Number of Children: N/A  . Years of Education: N/A   Occupational History  . Not on file.   Social History Main Topics  . Smoking status: Never Smoker   . Smokeless tobacco: Never Used  . Alcohol Use: No  . Drug Use: No  . Sexual Activity: Not on file   Other Topics Concern  . Not on file   Social History Narrative    Past Surgical History  Procedure Laterality Date  . Hysteroscopy      fibroids  . Laparoscopy      fibroids  . Cholecystectomy    . Hiatal hernia repair      Family History  Problem Relation Age of Onset  . Adopted: Yes  . Colon cancer Neg Hx   . Pancreatic cancer Neg Hx   . Rectal cancer Neg Hx   . Stomach cancer Neg Hx     Allergies  Allergen Reactions  . Statins Other (See Comments)    Muscle aches    Current Outpatient Prescriptions on File Prior to Visit  Medication Sig Dispense Refill  .  aspirin 81 MG tablet Take 81 mg by mouth daily.      . calcium carbonate (OS-CAL) 600 MG TABS tablet Take 600 mg by mouth daily with breakfast.    . escitalopram (LEXAPRO) 20 MG tablet take 1 tablet by mouth once daily 90 tablet 3  . Insulin Pen Needle (CLICKFINE PEN NEEDLES) 31G X 8 MM MISC Use once daily as directed. 100 each 1  . Liraglutide (VICTOZA) 18 MG/3ML SOPN Inject 1.2 mg into the skin daily. 6 mL 5  . metoprolol succinate (TOPROL-XL) 100 MG 24 hr tablet Take 1 tablet (100 mg total) by mouth daily. Take with or immediately following a meal. 90 tablet 1  . Multiple Vitamin (MULITIVITAMIN WITH MINERALS) TABS Take 1 tablet by mouth daily.    . nortriptyline (PAMELOR) 25 MG capsule take 1 capsule by mouth at bedtime 90 capsule 1  . OVER THE COUNTER MEDICATION Calcium citrate    . SYNTHROID 150 MCG tablet take 1 tablet by mouth once daily 90 tablet 0  . valsartan (DIOVAN) 160 MG tablet Take 1 tablet (160 mg total) by mouth daily. 90 tablet 1  . VITAMIN D, CHOLECALCIFEROL, PO Take 2,000 Units by mouth daily.  No current facility-administered medications on file prior to visit.    BP 136/84 mmHg  Temp(Src) 97.7 F (36.5 C) (Oral)  Ht 5' 5"  (1.651 m)  Wt 181 lb (82.101 kg)  BMI 30.12 kg/m2    Objective:   Physical Exam  Constitutional: She appears well-developed and well-nourished.  Cardiovascular: Normal rate, regular rhythm and normal heart sounds.   No murmur heard. Pulmonary/Chest: Effort normal and breath sounds normal. She has no wheezes.  Abdominal: Soft. She exhibits no mass. There is no rebound and no guarding.  Increased bowel sounds, slightly distended abdomen positive tympany  Musculoskeletal: She exhibits no edema.  Psychiatric: She has a normal mood and affect. Her behavior is normal.          Assessment & Plan:

## 2014-02-24 NOTE — Assessment & Plan Note (Signed)
Hold metformin.  Patient complains Victoza cost prohibitive.  We will discuss resuming Janumet and Actos at next office visit.

## 2014-02-24 NOTE — Progress Notes (Signed)
Pre visit review using our clinic review tool, if applicable. No additional management support is needed unless otherwise documented below in the visit note. 

## 2014-02-25 ENCOUNTER — Telehealth: Payer: Self-pay | Admitting: Gastroenterology

## 2014-02-25 LAB — BASIC METABOLIC PANEL
BUN: 11 mg/dL (ref 6–23)
CO2: 25 mEq/L (ref 19–32)
Calcium: 9.3 mg/dL (ref 8.4–10.5)
Chloride: 102 mEq/L (ref 96–112)
Creatinine, Ser: 0.7 mg/dL (ref 0.4–1.2)
GFR: 89.32 mL/min (ref >60.00)
Glucose, Bld: 127 mg/dL — ABNORMAL HIGH (ref 70–99)
Potassium: 4 mEq/L (ref 3.5–5.1)
Sodium: 135 mEq/L (ref 135–145)

## 2014-02-25 LAB — CBC WITH DIFFERENTIAL/PLATELET
Basophils Absolute: 0 10*3/uL (ref 0.0–0.1)
Basophils Relative: 0.4 % (ref 0.0–3.0)
Eosinophils Absolute: 0.2 10*3/uL (ref 0.0–0.7)
Eosinophils Relative: 2 % (ref 0.0–5.0)
HCT: 36.7 % (ref 36.0–46.0)
Hemoglobin: 11.4 g/dL — ABNORMAL LOW (ref 12.0–15.0)
Lymphocytes Relative: 16.7 % (ref 12.0–46.0)
Lymphs Abs: 1.4 10*3/uL (ref 0.7–4.0)
MCHC: 31 g/dL (ref 30.0–36.0)
MCV: 63.2 fl — ABNORMAL LOW (ref 78.0–100.0)
Monocytes Absolute: 0.5 10*3/uL (ref 0.1–1.0)
Monocytes Relative: 5.6 % (ref 3.0–12.0)
Neutro Abs: 6.2 10*3/uL (ref 1.4–7.7)
Neutrophils Relative %: 75.3 % (ref 43.0–77.0)
Platelets: 400 10*3/uL (ref 150.0–400.0)
RBC: 5.8 Mil/uL — ABNORMAL HIGH (ref 3.87–5.11)
RDW: 15 % (ref 11.5–15.5)
WBC: 8.2 10*3/uL (ref 4.0–10.5)

## 2014-02-25 NOTE — Telephone Encounter (Signed)
Patient is having "green diarrhea". Her PCP treated her with ATB's and took her off her diabetic medications without improvement. She agrees to appointment with [name], [facility]

## 2014-02-26 ENCOUNTER — Telehealth: Payer: Self-pay | Admitting: Internal Medicine

## 2014-02-26 NOTE — Telephone Encounter (Signed)
Metallic taste is likely side effect from flagyl.  It should go away as she stop antibiotic

## 2014-02-26 NOTE — Telephone Encounter (Signed)
Pt aware.

## 2014-02-26 NOTE — Telephone Encounter (Signed)
Patient is continuously having a metallic taste in her mouth and does Dr. Shawna Orleans want her to completely stop taking her diabetes medication until she sees Dr. Shawna Orleans in a month?

## 2014-02-27 LAB — CLOSTRIDIUM DIFFICILE BY PCR: Toxigenic C. Difficile by PCR: NOT DETECTED

## 2014-02-27 LAB — FECAL LACTOFERRIN, QUANT: Lactoferrin: POSITIVE

## 2014-03-03 ENCOUNTER — Ambulatory Visit: Payer: Medicare Other | Admitting: Physician Assistant

## 2014-03-04 ENCOUNTER — Other Ambulatory Visit: Payer: Self-pay | Admitting: Internal Medicine

## 2014-03-04 ENCOUNTER — Telehealth: Payer: Self-pay | Admitting: Internal Medicine

## 2014-03-04 MED ORDER — SITAGLIP PHOS-METFORMIN HCL ER 50-1000 MG PO TB24: 1.0000 | ORAL_TABLET | Freq: Two times a day (BID) | ORAL | Status: DC

## 2014-03-04 MED ORDER — PIOGLITAZONE HCL 15 MG PO TABS: 7.5000 mg | ORAL_TABLET | Freq: Every day | ORAL | Status: DC

## 2014-03-04 NOTE — Telephone Encounter (Addendum)
Pt has victoza and would like to finish it then start the [place].  She has a lot of victoza.  Is this ok?

## 2014-03-04 NOTE — Telephone Encounter (Signed)
victoza discontinued. Pt restarted on her previous diabetic medication

## 2014-03-04 NOTE — Telephone Encounter (Signed)
FYI pt diarrhea has resolved and she would like to go back on her dm meds. Pt has appt with dr Deatra Ina soon

## 2014-03-05 NOTE — Telephone Encounter (Signed)
Yes, ok to finish victoza then start Janumet

## 2014-03-05 NOTE — Telephone Encounter (Signed)
Spoke to pt, told her to continue Victoza till finished then can start the [place]. Pt verbalized understanding.

## 2014-03-08 ENCOUNTER — Encounter: Payer: Self-pay | Admitting: Physician Assistant

## 2014-03-08 ENCOUNTER — Other Ambulatory Visit (INDEPENDENT_AMBULATORY_CARE_PROVIDER_SITE_OTHER): Payer: Medicare Other

## 2014-03-08 ENCOUNTER — Ambulatory Visit (INDEPENDENT_AMBULATORY_CARE_PROVIDER_SITE_OTHER): Payer: Medicare Other | Admitting: Physician Assistant

## 2014-03-08 VITALS — BP 122/80 | HR 80 | Ht 64.25 in | Wt 182.5 lb

## 2014-03-08 DIAGNOSIS — K5730 Diverticulosis of large intestine without perforation or abscess without bleeding: Secondary | ICD-10-CM

## 2014-03-08 DIAGNOSIS — I6529 Occlusion and stenosis of unspecified carotid artery: Secondary | ICD-10-CM

## 2014-03-08 DIAGNOSIS — K589 Irritable bowel syndrome without diarrhea: Secondary | ICD-10-CM

## 2014-03-08 LAB — CBC WITH DIFFERENTIAL/PLATELET
Basophils Absolute: 0 10*3/uL (ref 0.0–0.1)
Basophils Relative: 0.5 % (ref 0.0–3.0)
Eosinophils Absolute: 0.2 10*3/uL (ref 0.0–0.7)
Eosinophils Relative: 2 % (ref 0.0–5.0)
HCT: 38.9 % (ref 36.0–46.0)
Hemoglobin: 12 g/dL (ref 12.0–15.0)
Lymphocytes Relative: 23.9 % (ref 12.0–46.0)
Lymphs Abs: 2.1 10*3/uL (ref 0.7–4.0)
MCHC: 30.9 g/dL (ref 30.0–36.0)
MCV: 64 fl — ABNORMAL LOW (ref 78.0–100.0)
Monocytes Absolute: 0.4 10*3/uL (ref 0.1–1.0)
Monocytes Relative: 4.4 % (ref 3.0–12.0)
Neutro Abs: 6 10*3/uL (ref 1.4–7.7)
Neutrophils Relative %: 69.2 % (ref 43.0–77.0)
Platelets: 301 10*3/uL (ref 150.0–400.0)
RBC: 6.08 Mil/uL — ABNORMAL HIGH (ref 3.87–5.11)
RDW: 15.7 % — ABNORMAL HIGH (ref 11.5–15.5)
WBC: 8.7 10*3/uL (ref 4.0–10.5)

## 2014-03-08 NOTE — Patient Instructions (Signed)
Please go to the basement level to have your labs drawn.  We have given you a high fiber and low fat diet brochure.

## 2014-03-08 NOTE — Progress Notes (Signed)
Patient ID: Breanna Webster, female   DOB: Aug 04, 1945, 68 y.o.   MRN: [id number]     History of Present Illness: Thank you Breanna Webster is a pleasant 68 year old female who underwent a colonoscopy in April 2015 at which time 5 adenomatous polyps were removed. She has a history of left sigmoid/descending diverticulitis noted on previous CAT scans in 2010. Since then she has been followed for irritable bowel syndrome. She reports that over the past several years she will periodically have a 2 day bowel of diarrhea that resolves on its own approximately 3-3-1/2 weeks ago she began to have diarrhea associated with lower abdominal pain. She was seen by her primary care provider and stool cultures were obtained which were negative. She had a CBC that showed a white blood cell count of 14,000. She was empirically  treated for diverticulitis with a course of Cipro and Flagyl. While on the Cipro and Flagyl she had a metallic taste in her mouth. She only took the antibiotics for 5 or 6 days and then discontinued them. After stopping the antibiotics she went several days without a bowel movement and she is now having a formed bowel movement on a daily basis. While having the diarrhea, her diabetes medications were discontinued. She has restarted her metformin and toes without any recurrent diarrhea. Currently she has no abdominal pain. She has no fever or chills. Her appetite is good. She has no further metallic taste in her mouth. She says she was concerned that her Nissen fundoplication was destroyed by the antibiotics.   Past Medical History  Diagnosis Date  . Diabetes mellitus   . GERD (gastroesophageal reflux disease)   . Grave's disease   . Hyperlipidemia   . Osteopenia   . Hypertension   . Carotid artery stenosis     Mild  . Thalassemia minor   . Arthritis   . Colon polyps   . Diverticulosis   . IBS (irritable bowel syndrome)   . Diverticulitis     Past Surgical History  Procedure Laterality Date  .  Hysteroscopy      fibroids  . Laparoscopy      fibroids  . Cholecystectomy    . Hiatal hernia repair     Family History  Problem Relation Age of Onset  . Adopted: Yes  . Colon cancer Neg Hx   . Pancreatic cancer Neg Hx   . Rectal cancer Neg Hx   . Stomach cancer Neg Hx    History  Substance Use Topics  . Smoking status: Never Smoker   . Smokeless tobacco: Never Used  . Alcohol Use: No   Current Outpatient Prescriptions  Medication Sig Dispense Refill  . aspirin 81 MG tablet Take 81 mg by mouth daily.      . calcium carbonate (OS-CAL) 600 MG TABS tablet Take 600 mg by mouth daily with breakfast.    . colesevelam (WELCHOL) 625 MG tablet Take 2 tablets (1,250 mg total) by mouth 2 (two) times daily with a meal. 40 tablet 1  . escitalopram (LEXAPRO) 20 MG tablet take 1 tablet by mouth once daily 90 tablet 3  . Insulin Pen Needle (CLICKFINE PEN NEEDLES) 31G X 8 MM MISC Use once daily as directed. 100 each 1  . metFORMIN (GLUCOPHAGE) 500 MG tablet Take 500 mg by mouth 3 (three) times daily.     . metoprolol succinate (TOPROL-XL) 100 MG 24 hr tablet Take 1 tablet (100 mg total) by mouth daily. Take with or immediately following a  meal. 90 tablet 1  . Multiple Vitamin (MULITIVITAMIN WITH MINERALS) TABS Take 1 tablet by mouth daily.    . nortriptyline (PAMELOR) 25 MG capsule take 1 capsule by mouth at bedtime 90 capsule 1  . SYNTHROID 150 MCG tablet take 1 tablet by mouth once daily 90 tablet 0  . valsartan (DIOVAN) 160 MG tablet Take 1 tablet (160 mg total) by mouth daily. 90 tablet 1  . VICTOZA 18 MG/3ML SOPN 1.2 mLs daily.     Marland Kitchen VITAMIN D, CHOLECALCIFEROL, PO Take 2,000 Units by mouth daily.    . pioglitazone (ACTOS) 15 MG tablet Take 0.5 tablets (7.5 mg total) by mouth daily. 90 tablet 1  . SitaGLIPtin-MetFORMIN HCl (JANUMET XR) 50-1000 MG TB24 Take 1 tablet by mouth 2 (two) times daily. 180 tablet 1   No current facility-administered medications for this visit.   Allergies    Allergen Reactions  . Statins Other (See Comments)    Muscle aches      Review of Systems: Gen: Denies any fever, chills, sweats, anorexia, fatigue, weakness, malaise, weight loss, and sleep disorder CV: Denies chest pain, angina, palpitations, syncope, orthopnea, PND, peripheral edema, and claudication. Resp: Denies dyspnea at rest, dyspnea with exercise, cough, sputum, wheezing, coughing up blood, and pleurisy. GI: Denies vomiting blood, jaundice, and fecal incontinence.   Denies dysphagia or odynophagia. GU : Denies urinary burning, blood in urine, urinary frequency, urinary hesitancy, nocturnal urination, and urinary incontinence. MS: Denies joint pain, limitation of movement, and swelling, stiffness, low back pain, extremity pain. Denies muscle weakness, cramps, atrophy.  Derm: Denies rash, itching, dry skin, hives, moles, warts, or unhealing ulcers.  Psych: Denies depression, anxiety, memory loss, suicidal ideation, hallucinations, paranoia, and confusion. Heme: Denies bruising, bleeding, and enlarged lymph nodes. Neuro:  Denies any headaches, dizziness, paresthesia Endo:  Denies any problems with DM, thyroid, adrenal  LAB RESULTS: Stool for C. Difficile on 02/24/2014 was negative. CBC on 02/24/2014 had a white blood cell count of 14.8.  Physical Exam: General: Pleasant, well developed , pleasant female in no acute distress Head: Normocephalic and atraumatic Eyes:  sclerae anicteric, conjunctiva pink  Ears: Normal auditory acuity Lungs: Clear throughout to auscultation Heart: Regular rate and rhythm Abdomen: Soft, non distended, non-tender. No masses, no hepatomegaly. Normal bowel sound Musculoskeletal: Symmetrical with no gross deformities  Extremities: No edema  Neurological: Alert oriented x 4, grossly nonfocal Psychological:  Alert and cooperative. Normal mood and affect  Assessment and Recommendations:  68 year old female with a known history of diverticular disease  status post a bout of lower abdominal pain and diarrhea of several weeks duration here for follow-up. Her symptoms resolved after a course of Cipro and Flagyl. She likely had diverticulitis. She's been advised to continue a high fiber low fat diet. She's been instructed to avoid becoming constipated. She will use Benefiber heaping tablespoon daily.She will be due for a repeat colonoscopy in April 2018, and a letter to this effect was mailed to her on 08/11/2013. She will follow-up on an as-needed basis.       Sayre Witherington, Vita Barley PA-C 03/08/2014,

## 2014-03-09 NOTE — Progress Notes (Signed)
Reviewed and agree with management. Robert D. Kaplan, M.D., FACG  

## 2014-03-11 ENCOUNTER — Other Ambulatory Visit: Payer: Self-pay | Admitting: Internal Medicine

## 2014-03-17 ENCOUNTER — Telehealth: Payer: Self-pay | Admitting: Gastroenterology

## 2014-03-17 NOTE — Telephone Encounter (Signed)
Left message to call back. Patient seen on 03/08/14.  Assessment and Recommendations:  68 year old female with a known history of diverticular disease status post a bout of lower abdominal pain and diarrhea of several weeks duration here for follow-up. Her symptoms resolved after a course of Cipro and Flagyl. She likely had diverticulitis. She's been advised to continue a high fiber low fat diet. She's been instructed to avoid becoming constipated. She will use Benefiber heaping tablespoon daily.She will be due for a repeat colonoscopy in April 2018, and a letter to this effect was mailed to her on 08/11/2013. She will follow-up on an as-needed basis.

## 2014-03-21 ENCOUNTER — Other Ambulatory Visit: Payer: Self-pay | Admitting: Internal Medicine

## 2014-03-22 ENCOUNTER — Other Ambulatory Visit (HOSPITAL_COMMUNITY): Payer: Self-pay | Admitting: Obstetrics

## 2014-03-22 DIAGNOSIS — Z1231 Encounter for screening mammogram for malignant neoplasm of breast: Secondary | ICD-10-CM

## 2014-03-25 ENCOUNTER — Other Ambulatory Visit: Payer: Self-pay | Admitting: Internal Medicine

## 2014-03-25 ENCOUNTER — Ambulatory Visit (HOSPITAL_COMMUNITY): Payer: Medicare Other

## 2014-03-26 ENCOUNTER — Ambulatory Visit: Payer: Medicare Other | Admitting: Internal Medicine

## 2014-03-26 ENCOUNTER — Ambulatory Visit (HOSPITAL_COMMUNITY)
Admission: RE | Admit: 2014-03-26 | Discharge: 2014-03-26 | Disposition: A | Discharge status: Home / Self Care | Payer: Medicare Other | Source: Ambulatory Visit | Attending: Obstetrics | Admitting: Obstetrics

## 2014-03-26 DIAGNOSIS — Z1231 Encounter for screening mammogram for malignant neoplasm of breast: Secondary | ICD-10-CM | POA: Insufficient documentation

## 2014-03-29 ENCOUNTER — Encounter: Payer: Self-pay | Admitting: Internal Medicine

## 2014-03-29 ENCOUNTER — Ambulatory Visit (INDEPENDENT_AMBULATORY_CARE_PROVIDER_SITE_OTHER): Payer: Medicare Other | Admitting: Internal Medicine

## 2014-03-29 VITALS — BP 162/84 | HR 72 | Temp 98.8°F | Ht 64.25 in | Wt 181.0 lb

## 2014-03-29 DIAGNOSIS — R197 Diarrhea, unspecified: Secondary | ICD-10-CM

## 2014-03-29 DIAGNOSIS — I6529 Occlusion and stenosis of unspecified carotid artery: Secondary | ICD-10-CM

## 2014-03-29 DIAGNOSIS — E119 Type 2 diabetes mellitus without complications: Secondary | ICD-10-CM

## 2014-03-29 MED ORDER — COLESEVELAM HCL 625 MG PO TABS: 1250.0000 mg | ORAL_TABLET | Freq: Two times a day (BID) | ORAL | Status: DC

## 2014-03-29 NOTE — Assessment & Plan Note (Signed)
Patient seen by GI.  Her symptoms presumed secondary to bout of diverticulitis.  Follow up colonoscopy scheduled for 2018.  She still have intermittent loose stools - likely secondary to IBS.  Use Welchol as directed.

## 2014-03-29 NOTE — Progress Notes (Signed)
Subjective:    Patient ID: [name], female    DOB: 03/25/1946, 68 y.o.   MRN: [id number]  HPI  68 year old white female with history of type 2 diabetes, hypertension and IBS for follow-up. Interval medical history-patient was seen by gastroenterologist. They agree patient likely had IBS flare versus episode of diverticulitis. Patient's profuse diarrhea has resolved. She occasionally has loose stools. She has restarted metformin therapy.  Hypertension-her blood pressure is sporadically elevated today. Patient reports good medication compliance.  Type 2 diabetes-patient will like to finish her prescription for Victoza and metformin. She plans to transition to [place].  Review of Systems Intermittent diarrhea Negative for abdominal pain    Past Medical History  Diagnosis Date  . Diabetes mellitus   . GERD (gastroesophageal reflux disease)   . Grave's disease   . Hyperlipidemia   . Osteopenia   . Hypertension   . Carotid artery stenosis     Mild  . Thalassemia minor   . Arthritis   . Colon polyps   . Diverticulosis   . IBS (irritable bowel syndrome)   . Diverticulitis     History   Social History  . Marital Status: Married    Spouse Name: N/A    Number of Children: 2  . Years of Education: N/A   Occupational History  . fundraising    Social History Main Topics  . Smoking status: Never Smoker   . Smokeless tobacco: Never Used  . Alcohol Use: No  . Drug Use: No  . Sexual Activity: Not on file   Other Topics Concern  . Not on file   Social History Narrative    Past Surgical History  Procedure Laterality Date  . Hysteroscopy      fibroids  . Laparoscopy      fibroids  . Cholecystectomy    . Hiatal hernia repair      Family History  Problem Relation Age of Onset  . Adopted: Yes  . Colon cancer Neg Hx   . Pancreatic cancer Neg Hx   . Rectal cancer Neg Hx   . Stomach cancer Neg Hx     Allergies  Allergen Reactions  . Statins Other (See  Comments)    Muscle aches    Current Outpatient Prescriptions on File Prior to Visit  Medication Sig Dispense Refill  . aspirin 81 MG tablet Take 81 mg by mouth daily.      . calcium carbonate (OS-CAL) 600 MG TABS tablet Take 600 mg by mouth daily with breakfast.    . escitalopram (LEXAPRO) 20 MG tablet take 1 tablet by mouth once daily 90 tablet 3  . Insulin Pen Needle (CLICKFINE PEN NEEDLES) 31G X 8 MM MISC Use once daily as directed. 100 each 1  . metoprolol succinate (TOPROL-XL) 100 MG 24 hr tablet TAKE 1 TABLET BY MOUTH DAILY WITH OR IMMEDIATELY FOLLOWING A MEAL 90 tablet 1  . Multiple Vitamin (MULITIVITAMIN WITH MINERALS) TABS Take 1 tablet by mouth daily.    . nortriptyline (PAMELOR) 25 MG capsule take 1 capsule by mouth at bedtime 90 capsule 1  . SitaGLIPtin-MetFORMIN HCl (JANUMET XR) 50-1000 MG TB24 Take 1 tablet by mouth 2 (two) times daily. 180 tablet 1  . SYNTHROID 150 MCG tablet take 1 tablet by mouth once daily 90 tablet 0  . valsartan (DIOVAN) 160 MG tablet take 1 tablet by mouth once daily 90 tablet 1  . VITAMIN D, CHOLECALCIFEROL, PO Take 2,000 Units by mouth daily.  No current facility-administered medications on file prior to visit.    BP 162/84 mmHg  Pulse 72  Temp(Src) 98.8 F (37.1 C) (Oral)  Ht 5' 4.25" (1.632 m)  Wt 181 lb (82.101 kg)  BMI 30.83 kg/m2    Objective:   Physical Exam  Constitutional: She is oriented to person, place, and time. She appears well-developed and well-nourished.  Cardiovascular: Normal rate, regular rhythm and normal heart sounds.   No murmur heard. Pulmonary/Chest: Effort normal and breath sounds normal. She has no wheezes.  Neurological: She is alert and oriented to person, place, and time. No cranial nerve deficit.  Psychiatric: She has a normal mood and affect. Her behavior is normal.          Assessment & Plan:

## 2014-03-29 NOTE — Patient Instructions (Signed)
Monitor your blood pressure at home as directed.  Contact our office with your blood pressure readings within 1-2 weeks. Take Welchol on a regular basis

## 2014-03-29 NOTE — Assessment & Plan Note (Signed)
She if finishing her last month of Victoza.  Transition to [place].  She defers Actos.

## 2014-03-29 NOTE — Progress Notes (Signed)
Pre visit review using our clinic review tool, if applicable. No additional management support is needed unless otherwise documented below in the visit note. 

## 2014-04-05 ENCOUNTER — Other Ambulatory Visit: Payer: Self-pay | Admitting: Internal Medicine

## 2014-04-05 ENCOUNTER — Telehealth: Payer: Self-pay | Admitting: *Deleted

## 2014-04-05 ENCOUNTER — Encounter: Payer: Medicare Other | Admitting: *Deleted

## 2014-04-05 ENCOUNTER — Telehealth: Payer: Self-pay

## 2014-04-05 MED ORDER — AMLODIPINE BESYLATE 5 MG PO TABS: 5.0000 mg | ORAL_TABLET | Freq: Every day | ORAL | Status: DC

## 2014-04-05 NOTE — Telephone Encounter (Signed)
Pt reports her BP this morning and its 192/109.  She is taking diovan 160 mg once daily and metoprolol 100 mg once daily.  Please advise, there are no openings with anyone today.

## 2014-04-05 NOTE — Telephone Encounter (Signed)
Pt has not taken her BP medicine.  She is going to take it and call me back this afternoon with BP reading

## 2014-04-05 NOTE — Telephone Encounter (Signed)
[facility] Client [facility] Patient Name: Breanna Webster Gender: Female DOB: [birth year] Age: 68 Y 44 M 20 D Return Phone Number: [phone] (Primary), [phone] (Secondary) Address: City/State/Zip: [place] Client [facility] - Client Client Site [place] - Day Physician [place], [place] Type Call Call Type Triage / Clinical Relationship To Patient Self Return Phone Number [phone] (Primary) Chief Complaint Blood Pressure High Initial Comment Caller states, BP is 196/102 , no symptoms, she wants to change her Rx PreDisposition Did not know what to do Nurse Assessment Nurse: Loletta Specter, RN, Wells Guiles Date/Time (Eastern Time): 04/05/2014 10:42:27 AM Confirm and document reason for call. If symptomatic, describe symptoms. ---Caller states BP 196/102. No symptoms. Has the patient traveled out of the country within the last 30 days? ---Not Applicable Does the patient require triage? ---Yes Related visit to physician within the last 2 weeks? ---No Does the PT have any chronic conditions? (i.e. diabetes, asthma, etc.) ---Yes List chronic conditions. ---HTN, diabetes. Guidelines Guideline Title Affirmed Question Affirmed Notes Nurse Date/Time (Eastern Time) High Blood Pressure BP # 180/110 Loletta Specter, RN, Wells Guiles 04/05/2014 10:43:21 AM Disp. Time Eilene Ghazi Time) Disposition Final User 04/05/2014 10:45:18 AM See Physician within 24 Hours Yes Loletta Specter, RN, Romualdo Bolk Understands: Yes Disagree/Comply: Comply Care Advice Given Per Guideline SEE PHYSICIAN WITHIN 24 HOURS: * IF OFFICE WILL BE OPEN: You need to be examined within the next 24 hours. Call your doctor when the office opens, and make an appointment. CARE ADVICE given per High Blood Pressure (Adult) guideline. CALL BACK IF: * You become worse. PLEASE NOTE: All timestamps contained within this report are  represented as [place] Standard Time. CONFIDENTIALTY NOTICE: This fax transmission is intended only for the addressee. It contains information that is legally privileged, confidential or otherwise protected from use or disclosure. If you are not the intended recipient, you are strictly prohibited from reviewing, disclosing, copying using or disseminating any of this information or taking any action in reliance on or regarding this information. If you have received this fax in error, please notify us immediately by telephone so that we can arrange for its return to [name]. Phone: [phone], Toll-Free: [phone], Fax: [phone] Page: 2 of 2 Call Id: [id number] After Care Instructions Given Call Event Type User Date / Time Description Referrals REFERRED TO PCP OFFICE  Dr. Lora Havens CMA Jenny Reichmann spoke with pt about bp concerns.

## 2014-04-05 NOTE — Telephone Encounter (Signed)
If she is symptomatic from high BP, confusion, severe headache or chest pain, then she needs to go to ER.  Is she she asymptomatic, confirm she took her medications as usual.  If yes, call in amlodipine 5 mg # 30 one po qd.  RF x 1.  She can take her first dose today.  Continue to monitor BP.  We can try to add her this week.

## 2014-04-05 NOTE — Telephone Encounter (Signed)
Pt walked in the office this afternoon wanting me to take her BP.  I told front staff to tell add her to my schedule and I would be with her as soon as I can.  Pt checked in and left upset that we weren't very attentive to her needs.  She left her BP readings which were 137/88 at 12:30 and at 3pm 153/94 and 150/90.  Dr Shawna Orleans aware and would still like to add the amlodipine.  Called pt and made pt aware of adding the amlodipine.  She will pick up medication and keep record of her BP.  She is aware to take her medication before checking BP.  Also apologized to pt for the issue she had in our office.  Nothing further is needed

## 2014-05-05 ENCOUNTER — Ambulatory Visit: Payer: Medicare Other | Admitting: Internal Medicine

## 2014-05-07 ENCOUNTER — Other Ambulatory Visit: Payer: Self-pay | Admitting: Family Medicine

## 2014-05-24 ENCOUNTER — Ambulatory Visit: Payer: Medicare Other | Admitting: Internal Medicine

## 2014-05-24 ENCOUNTER — Encounter: Payer: Self-pay | Admitting: Internal Medicine

## 2014-05-24 ENCOUNTER — Ambulatory Visit (INDEPENDENT_AMBULATORY_CARE_PROVIDER_SITE_OTHER): Payer: Medicare Other | Admitting: Internal Medicine

## 2014-05-24 VITALS — BP 142/80 | HR 75 | Temp 98.1°F | Resp 20 | Ht 64.25 in | Wt 185.0 lb

## 2014-05-24 DIAGNOSIS — N951 Menopausal and female climacteric states: Secondary | ICD-10-CM | POA: Diagnosis not present

## 2014-05-24 DIAGNOSIS — E039 Hypothyroidism, unspecified: Secondary | ICD-10-CM

## 2014-05-24 DIAGNOSIS — E785 Hyperlipidemia, unspecified: Secondary | ICD-10-CM

## 2014-05-24 DIAGNOSIS — E119 Type 2 diabetes mellitus without complications: Secondary | ICD-10-CM | POA: Diagnosis not present

## 2014-05-24 DIAGNOSIS — R232 Flushing: Secondary | ICD-10-CM

## 2014-05-24 LAB — LIPID PANEL
Cholesterol: 156 mg/dL (ref 0–200)
HDL: 44.1 mg/dL (ref >39.00)
NonHDL: 111.9
Total CHOL/HDL Ratio: 4
Triglycerides: 227 mg/dL — ABNORMAL HIGH (ref 0.0–149.0)
VLDL: 45.4 mg/dL — ABNORMAL HIGH (ref 0.0–40.0)

## 2014-05-24 LAB — LDL CHOLESTEROL, DIRECT: Direct LDL: 98 mg/dL

## 2014-05-24 LAB — HEMOGLOBIN A1C: Hgb A1c MFr Bld: 6.8 % — ABNORMAL HIGH (ref 4.6–6.5)

## 2014-05-24 NOTE — Patient Instructions (Signed)
Limit your sodium (Salt) intake    It is important that you exercise regularly, at least 20 minutes 3 to 4 times per week.  If you develop chest pain or shortness of breath seek  medical attention.  You need to lose weight.  Consider a lower calorie diet and regular exercise.

## 2014-05-24 NOTE — Progress Notes (Signed)
Pre visit review using our clinic review tool, if applicable. No additional management support is needed unless otherwise documented below in the visit note. 

## 2014-05-24 NOTE — Progress Notes (Signed)
Subjective:    Patient ID: Breanna Webster, female    DOB: Apr 23, 1946, 69 y.o.   MRN: [id number]  HPI  Lab Results  Component Value Date   HGBA1C 6.4 02/08/2014    69 year old patient who has type 2 diabetes without complications. She has done well on Janumet.  Previously had been on Victoza.  Doing quite well on this medication, although has noted some weight gain since being off GLP-1 agonist.  Does complain of the cost, which is 90 dollars per month out-of-pocket  Past Medical History  Diagnosis Date  . Diabetes mellitus   . GERD (gastroesophageal reflux disease)   . Grave's disease   . Hyperlipidemia   . Osteopenia   . Hypertension   . Carotid artery stenosis     Mild  . Thalassemia minor   . Arthritis   . Colon polyps   . Diverticulosis   . IBS (irritable bowel syndrome)   . Diverticulitis     History   Social History  . Marital Status: Married    Spouse Name: N/A    Number of Children: 2  . Years of Education: N/A   Occupational History  . fundraising    Social History Main Topics  . Smoking status: Never Smoker   . Smokeless tobacco: Never Used  . Alcohol Use: No  . Drug Use: No  . Sexual Activity: Not on file   Other Topics Concern  . Not on file   Social History Narrative    Past Surgical History  Procedure Laterality Date  . Hysteroscopy      fibroids  . Laparoscopy      fibroids  . Cholecystectomy    . Hiatal hernia repair      Family History  Problem Relation Age of Onset  . Adopted: Yes  . Colon cancer Neg Hx   . Pancreatic cancer Neg Hx   . Rectal cancer Neg Hx   . Stomach cancer Neg Hx     Allergies  Allergen Reactions  . Statins Other (See Comments)    Muscle aches    Current Outpatient Prescriptions on File Prior to Visit  Medication Sig Dispense Refill  . amLODipine (NORVASC) 5 MG tablet Take 1 tablet (5 mg total) by mouth daily. 90 tablet 0  . aspirin 81 MG tablet Take 81 mg by mouth daily.      . calcium  carbonate (OS-CAL) 600 MG TABS tablet Take 600 mg by mouth daily with breakfast.    . colesevelam (WELCHOL) 625 MG tablet Take 2 tablets (1,250 mg total) by mouth 2 (two) times daily with a meal. 360 tablet 1  . escitalopram (LEXAPRO) 20 MG tablet take 1 tablet by mouth once daily 90 tablet 3  . Insulin Pen Needle (CLICKFINE PEN NEEDLES) 31G X 8 MM MISC Use once daily as directed. 100 each 1  . metoprolol succinate (TOPROL-XL) 100 MG 24 hr tablet TAKE 1 TABLET BY MOUTH DAILY WITH OR IMMEDIATELY FOLLOWING A MEAL 90 tablet 1  . Multiple Vitamin (MULITIVITAMIN WITH MINERALS) TABS Take 1 tablet by mouth daily.    . nortriptyline (PAMELOR) 25 MG capsule take 1 capsule by mouth at bedtime 90 capsule 1  . SitaGLIPtin-MetFORMIN HCl (JANUMET XR) 50-1000 MG TB24 Take 1 tablet by mouth 2 (two) times daily. 180 tablet 1  . SYNTHROID 150 MCG tablet take 1 tablet by mouth once daily 90 tablet 0  . valsartan (DIOVAN) 160 MG tablet take 1 tablet by mouth once  daily 90 tablet 1  . VITAMIN D, CHOLECALCIFEROL, PO Take 2,000 Units by mouth daily.     No current facility-administered medications on file prior to visit.    BP 142/80 mmHg  Pulse 75  Temp(Src) 98.1 F (36.7 C) (Oral)  Resp 20  Ht 5' 4.25" (1.632 m)  Wt 185 lb (83.915 kg)  BMI 31.51 kg/m2  SpO2 96%      Review of Systems  Constitutional: Positive for unexpected weight change.  HENT: Negative for congestion, dental problem, hearing loss, rhinorrhea, sinus pressure, sore throat and tinnitus.   Eyes: Negative for pain, discharge and visual disturbance.  Respiratory: Negative for cough and shortness of breath.   Cardiovascular: Negative for chest pain, palpitations and leg swelling.  Gastrointestinal: Negative for nausea, vomiting, abdominal pain, diarrhea, constipation, blood in stool and abdominal distention.  Genitourinary: Negative for dysuria, urgency, frequency, hematuria, flank pain, vaginal bleeding, vaginal discharge, difficulty  urinating, vaginal pain and pelvic pain.  Musculoskeletal: Negative for joint swelling, arthralgias and gait problem.  Skin: Negative for rash.  Neurological: Negative for dizziness, syncope, speech difficulty, weakness, numbness and headaches.  Hematological: Negative for adenopathy.  Psychiatric/Behavioral: Negative for behavioral problems, dysphoric mood and agitation. The patient is not nervous/anxious.        Objective:   Physical Exam  Constitutional: She is oriented to person, place, and time. She appears well-developed and well-nourished.  Repeat blood pressure 126/80  HENT:  Head: Normocephalic.  Right Ear: External ear normal.  Left Ear: External ear normal.  Mouth/Throat: Oropharynx is clear and moist.  Eyes: Conjunctivae and EOM are normal. Pupils are equal, round, and reactive to light.  Neck: Normal range of motion. Neck supple. No thyromegaly present.  Cardiovascular: Normal rate, regular rhythm, normal heart sounds and intact distal pulses.   Pedal pulses full  Pulmonary/Chest: Effort normal and breath sounds normal.  Abdominal: Soft. Bowel sounds are normal. She exhibits no mass. There is no tenderness.  Musculoskeletal: Normal range of motion.  Lymphadenopathy:    She has no cervical adenopathy.  Neurological: She is alert and oriented to person, place, and time.  Skin: Skin is warm and dry. No rash noted.  Psychiatric: She has a normal mood and affect. Her behavior is normal.          Assessment & Plan:   Diabetes mellitus.  Appears to be well controlled.  Will check a hemoglobin A1c.  Lifestyle issues discussed and encouraged.  Will check in 3 months.  If hemoglobin A1c is doing well today and perhaps even better in 3 months, will consider a trial on metformin therapy alone Dyslipidemia.  Statin intolerance.  We'll check a lipid profile  Return in 3 months for follow-up

## 2014-05-25 ENCOUNTER — Other Ambulatory Visit: Payer: Self-pay | Admitting: Internal Medicine

## 2014-06-03 ENCOUNTER — Encounter: Payer: Self-pay | Admitting: Internal Medicine

## 2014-06-03 ENCOUNTER — Ambulatory Visit (INDEPENDENT_AMBULATORY_CARE_PROVIDER_SITE_OTHER): Payer: Medicare Other | Admitting: Internal Medicine

## 2014-06-03 ENCOUNTER — Ambulatory Visit (INDEPENDENT_AMBULATORY_CARE_PROVIDER_SITE_OTHER)
Admission: RE | Admit: 2014-06-03 | Discharge: 2014-06-03 | Disposition: A | Discharge status: Home / Self Care | Payer: Medicare Other | Source: Ambulatory Visit | Attending: Internal Medicine | Admitting: Internal Medicine

## 2014-06-03 VITALS — BP 140/90 | HR 72 | Temp 98.1°F | Resp 20 | Ht 64.25 in | Wt 183.0 lb

## 2014-06-03 DIAGNOSIS — R0789 Other chest pain: Secondary | ICD-10-CM

## 2014-06-03 DIAGNOSIS — R011 Cardiac murmur, unspecified: Secondary | ICD-10-CM

## 2014-06-03 DIAGNOSIS — R0781 Pleurodynia: Secondary | ICD-10-CM | POA: Diagnosis not present

## 2014-06-03 DIAGNOSIS — I517 Cardiomegaly: Secondary | ICD-10-CM

## 2014-06-03 DIAGNOSIS — E119 Type 2 diabetes mellitus without complications: Secondary | ICD-10-CM

## 2014-06-03 MED ORDER — HYDROCODONE-ACETAMINOPHEN 5-325 MG PO TABS: 1.0000 | ORAL_TABLET | Freq: Four times a day (QID) | ORAL | Status: DC | PRN
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
## 2014-06-08 ENCOUNTER — Other Ambulatory Visit (HOSPITAL_COMMUNITY): Payer: Medicare Other

## 2014-06-09 ENCOUNTER — Ambulatory Visit (HOSPITAL_COMMUNITY): Payer: Medicare Other | Attending: Internal Medicine | Admitting: Radiology

## 2014-06-09 DIAGNOSIS — R011 Cardiac murmur, unspecified: Secondary | ICD-10-CM | POA: Diagnosis not present

## 2014-06-09 DIAGNOSIS — I517 Cardiomegaly: Secondary | ICD-10-CM | POA: Insufficient documentation

## 2014-06-09 NOTE — Progress Notes (Signed)
Echocardiogram performed.  

## 2014-06-26 ENCOUNTER — Other Ambulatory Visit: Payer: Self-pay | Admitting: Internal Medicine

## 2014-07-28 ENCOUNTER — Other Ambulatory Visit: Payer: Self-pay | Admitting: *Deleted

## 2014-07-28 MED ORDER — NORTRIPTYLINE HCL 25 MG PO CAPS: 25.0000 mg | ORAL_CAPSULE | Freq: Every day | ORAL | Status: DC

## 2014-08-07 ENCOUNTER — Other Ambulatory Visit: Payer: Self-pay | Admitting: Internal Medicine

## 2014-08-16 ENCOUNTER — Other Ambulatory Visit: Payer: Self-pay | Admitting: Internal Medicine

## 2014-09-01 ENCOUNTER — Ambulatory Visit (INDEPENDENT_AMBULATORY_CARE_PROVIDER_SITE_OTHER): Payer: Medicare Other | Admitting: Family Medicine

## 2014-09-01 VITALS — BP 116/78 | HR 65 | Temp 98.6°F | Ht 64.25 in | Wt 178.0 lb

## 2014-09-01 DIAGNOSIS — J209 Acute bronchitis, unspecified: Secondary | ICD-10-CM | POA: Diagnosis not present

## 2014-09-01 MED ORDER — BENZONATATE 200 MG PO CAPS: 200.0000 mg | ORAL_CAPSULE | Freq: Three times a day (TID) | ORAL | Status: DC | PRN

## 2014-09-01 NOTE — Progress Notes (Signed)
Pre visit review using our clinic review tool, if applicable. No additional management support is needed unless otherwise documented below in the visit note. 

## 2014-09-01 NOTE — Patient Instructions (Signed)

## 2014-09-01 NOTE — Progress Notes (Signed)
   Subjective:    Patient ID: [name], female    DOB: [birth year], 69 y.o.   MRN: [id number]  HPI Acute visit. Patient just returned from [place]. She apparently was treated for UTI with Cipro and has no dysuria this time. She has 4 day history of cough productive of green sputum. Mild fatigue. No fever. No dyspnea. Minimal nasal congestion. No wheezing. Nonsmoker. No chronic lung disease.  Past Medical History  Diagnosis Date  . Diabetes mellitus   . GERD (gastroesophageal reflux disease)   . Grave's disease   . Hyperlipidemia   . Osteopenia   . Hypertension   . Carotid artery stenosis     Mild  . Thalassemia minor   . Arthritis   . Colon polyps   . Diverticulosis   . IBS (irritable bowel syndrome)   . Diverticulitis    Past Surgical History  Procedure Laterality Date  . Hysteroscopy      fibroids  . Laparoscopy      fibroids  . Cholecystectomy    . Hiatal hernia repair      reports that she has never smoked. She has never used smokeless tobacco. She reports that she does not drink alcohol or use illicit drugs. family history is negative for Colon cancer, Pancreatic cancer, Rectal cancer, and Stomach cancer. She was adopted. Allergies  Allergen Reactions  . Statins Other (See Comments)    Muscle aches  \   Review of Systems  Constitutional: Positive for fatigue. Negative for fever and chills.  HENT: Positive for congestion.   Respiratory: Positive for cough. Negative for shortness of breath.        Objective:   Physical Exam  Constitutional: She appears well-developed and well-nourished.  HENT:  Right Ear: External ear normal.  Left Ear: External ear normal.  Mouth/Throat: Oropharynx is clear and moist.  Neck: Neck supple.  Cardiovascular: Normal rate and regular rhythm.   Pulmonary/Chest: Effort normal and breath sounds normal. No respiratory distress. She has no wheezes. She has no rales.  Lymphadenopathy:    She has no cervical adenopathy.           Assessment & Plan:  Cough. Suspect acute viral bronchitis. Try over-the-counter Mucinex. Tessalon Perles 200 mg every 8 hours as need for cough. Follow-up for fever or worsening symptoms.

## 2014-09-22 ENCOUNTER — Other Ambulatory Visit: Payer: Self-pay | Admitting: Internal Medicine

## 2014-09-24 ENCOUNTER — Other Ambulatory Visit: Payer: Self-pay | Admitting: Internal Medicine

## 2014-09-27 ENCOUNTER — Encounter: Payer: Self-pay | Admitting: Family Medicine

## 2014-09-27 ENCOUNTER — Ambulatory Visit (INDEPENDENT_AMBULATORY_CARE_PROVIDER_SITE_OTHER): Payer: Medicare Other | Admitting: Family Medicine

## 2014-09-27 ENCOUNTER — Ambulatory Visit (INDEPENDENT_AMBULATORY_CARE_PROVIDER_SITE_OTHER)
Admission: RE | Admit: 2014-09-27 | Discharge: 2014-09-27 | Disposition: A | Discharge status: Home / Self Care | Payer: Medicare Other | Source: Ambulatory Visit | Attending: Family Medicine | Admitting: Family Medicine

## 2014-09-27 VITALS — BP 130/74 | HR 71 | Temp 98.6°F | Wt 179.0 lb

## 2014-09-27 DIAGNOSIS — R059 Cough, unspecified: Secondary | ICD-10-CM

## 2014-09-27 DIAGNOSIS — R05 Cough: Secondary | ICD-10-CM

## 2014-09-27 DIAGNOSIS — R0602 Shortness of breath: Secondary | ICD-10-CM | POA: Diagnosis not present

## 2014-09-27 MED ORDER — AZITHROMYCIN 250 MG PO TABS: ORAL_TABLET | ORAL | Status: DC

## 2014-09-27 NOTE — Progress Notes (Signed)
Pre visit review using our clinic review tool, if applicable. No additional management support is needed unless otherwise documented below in the visit note. 

## 2014-09-27 NOTE — Progress Notes (Signed)
   Subjective:    Patient ID: Breanna Webster, female    DOB: [birth year], 69 y.o.   MRN: [id number]  HPI Patient is nonsmoker seen with persistent cough. Refer to recent note. She just returned from [place] and developed a cough a few days prior to her return. She has never had any fever. Cough is productive of green sputum especially early in the mornings. No hemoptysis. No appetite or weight changes. No dyspnea. Denies any GERD or postnasal drip symptoms.  Past Medical History  Diagnosis Date  . Diabetes mellitus   . GERD (gastroesophageal reflux disease)   . Grave's disease   . Hyperlipidemia   . Osteopenia   . Hypertension   . Carotid artery stenosis     Mild  . Thalassemia minor   . Arthritis   . Colon polyps   . Diverticulosis   . IBS (irritable bowel syndrome)   . Diverticulitis    Past Surgical History  Procedure Laterality Date  . Hysteroscopy      fibroids  . Laparoscopy      fibroids  . Cholecystectomy    . Hiatal hernia repair      reports that she has never smoked. She has never used smokeless tobacco. She reports that she does not drink alcohol or use illicit drugs. family history is negative for Colon cancer, Pancreatic cancer, Rectal cancer, and Stomach cancer. She was adopted. Allergies  Allergen Reactions  . Statins Other (See Comments)    Muscle aches      Review of Systems  Constitutional: Negative for fever, chills, appetite change and unexpected weight change.  HENT: Negative for congestion and postnasal drip.   Respiratory: Positive for cough. Negative for shortness of breath.   Cardiovascular: Negative for chest pain.       Objective:   Physical Exam  Constitutional: She appears well-developed and well-nourished. No distress.  HENT:  Mouth/Throat: Oropharynx is clear and moist.  Neck: Neck supple.  Cardiovascular: Normal rate and regular rhythm.   Pulmonary/Chest: Effort normal and breath sounds normal. No respiratory distress. She has  no wheezes. She has no rales.  Lymphadenopathy:    She has no cervical adenopathy.          Assessment & Plan:  Persistent cough. Suspect recent acute viral bronchitis. Obtain chest x-ray. Consider course of Zithromax secondary to persistent productive cough over one month duration.

## 2014-09-28 ENCOUNTER — Encounter: Payer: Self-pay | Admitting: Endocrinology

## 2014-09-28 ENCOUNTER — Ambulatory Visit (INDEPENDENT_AMBULATORY_CARE_PROVIDER_SITE_OTHER): Payer: Medicare Other | Admitting: Endocrinology

## 2014-09-28 VITALS — BP 132/84 | HR 73 | Temp 98.1°F | Ht 64.25 in | Wt 178.0 lb

## 2014-09-28 DIAGNOSIS — E119 Type 2 diabetes mellitus without complications: Secondary | ICD-10-CM | POA: Diagnosis not present

## 2014-09-28 LAB — HEMOGLOBIN A1C: Hgb A1c MFr Bld: 6 % (ref 4.6–6.5)

## 2014-09-28 LAB — TSH: TSH: 0.79 u[IU]/mL (ref 0.35–4.50)

## 2014-09-28 NOTE — Progress Notes (Signed)
Subjective:    Patient ID: Breanna Webster, female    DOB: 01-Nov-1945, 69 y.o.   MRN: [id number]  HPI  Pt returns for f/u of diabetes mellitus: DM type: 2 Dx'ed: 7062 Complications: none Therapy: 3 oral meds GDM: never DKA: never Severe hypoglycemia: never Pancreatitis: never Other: she has never been on insulin Interval history: pt states she feels well in general.   Past Medical History  Diagnosis Date  . Diabetes mellitus   . GERD (gastroesophageal reflux disease)   . Grave's disease   . Hyperlipidemia   . Osteopenia   . Hypertension   . Carotid artery stenosis     Mild  . Thalassemia minor   . Arthritis   . Colon polyps   . Diverticulosis   . IBS (irritable bowel syndrome)   . Diverticulitis     Past Surgical History  Procedure Laterality Date  . Hysteroscopy      fibroids  . Laparoscopy      fibroids  . Cholecystectomy    . Hiatal hernia repair      History   Social History  . Marital Status: Married    Spouse Name: N/A  . Number of Children: 2  . Years of Education: N/A   Occupational History  . fundraising    Social History Main Topics  . Smoking status: Never Smoker   . Smokeless tobacco: Never Used  . Alcohol Use: No  . Drug Use: No  . Sexual Activity: Not on file   Other Topics Concern  . Not on file   Social History Narrative    Current Outpatient Prescriptions on File Prior to Visit  Medication Sig Dispense Refill  . amLODipine (NORVASC) 5 MG tablet take 1 tablet by mouth once daily 90 tablet 0  . aspirin 81 MG tablet Take 81 mg by mouth daily.      . calcium carbonate (OS-CAL) 600 MG TABS tablet Take 600 mg by mouth daily with breakfast.    . colesevelam (WELCHOL) 625 MG tablet Take 2 tablets (1,250 mg total) by mouth 2 (two) times daily with a meal. 360 tablet 1  . escitalopram (LEXAPRO) 20 MG tablet take 1 tablet by mouth once daily 90 tablet 3  . JANUMET XR 50-1000 MG TB24 take 1 tablet by mouth twice a day 90 tablet 0  .  metoprolol succinate (TOPROL-XL) 100 MG 24 hr tablet TAKE 1 TABLET BY MOUTH DAILY WITH OR IMMEDIATELY FOLLOWING A MEAL 90 tablet 1  . Multiple Vitamin (MULITIVITAMIN WITH MINERALS) TABS Take 1 tablet by mouth daily.    . nortriptyline (PAMELOR) 25 MG capsule Take 1 capsule (25 mg total) by mouth at bedtime. 90 capsule 1  . SYNTHROID 150 MCG tablet take 1 tablet by mouth once daily 90 tablet 0  . valsartan (DIOVAN) 160 MG tablet take 1 tablet by mouth once daily 90 tablet 1  . VITAMIN D, CHOLECALCIFEROL, PO Take 2,000 Units by mouth daily.     No current facility-administered medications on file prior to visit.    Allergies  Allergen Reactions  . Statins Other (See Comments)    Muscle aches    Family History  Problem Relation Age of Onset  . Adopted: Yes  . Colon cancer Neg Hx   . Pancreatic cancer Neg Hx   . Rectal cancer Neg Hx   . Stomach cancer Neg Hx     BP 132/84 mmHg  Pulse 73  Temp(Src) 98.1 F (36.7 C) (Oral)  Ht 5' 4.25" (1.632 m)  Wt 178 lb (80.74 kg)  BMI 30.31 kg/m2  SpO2 92%  Review of Systems She denies hypoglycemia    Objective:   Physical Exam VITAL SIGNS:  See vs page GENERAL: no distress Pulses: dorsalis pedis intact bilat.   MSK: no deformity of the feet CV: no leg edema Skin:  no ulcer on the feet.  normal color and temp on the feet. Neuro: sensation is intact to touch on the feet   Lab Results  Component Value Date   HGBA1C 6.0 09/28/2014   Lab Results  Component Value Date   TSH 0.79 09/28/2014      Assessment & Plan:  DM: well-controlled. Hypothyroidism: well-replaced.  Patient is advised the following: Patient Instructions  A diabetes blood test are being requested for you today.  We'll contact you with results.   Please come back for a follow-up appointment in 3-6 months.   If you wish, we can change janumet to repaglinide+metformin (both generic).   We can also change the welchol to colestipol (generic).   check your blood  sugar once a day.  vary the time of day when you check, between before the 3 meals, and at bedtime.  also check if you have symptoms of your blood sugar being too high or too low.  please keep a record of the readings and bring it to your next appointment here.  You can write it on any piece of paper.  please call us sooner if your blood sugar goes below 70, or if you have a lot of readings over 200.    addendum: Please continue the same medications.

## 2014-09-28 NOTE — Patient Instructions (Addendum)
A diabetes blood test are being requested for you today.  We'll contact you with results.   Please come back for a follow-up appointment in 3-6 months.   If you wish, we can change janumet to repaglinide+metformin (both generic).   We can also change the welchol to colestipol (generic).   check your blood sugar once a day.  vary the time of day when you check, between before the 3 meals, and at bedtime.  also check if you have symptoms of your blood sugar being too high or too low.  please keep a record of the readings and bring it to your next appointment here.  You can write it on any piece of paper.  please call us sooner if your blood sugar goes below 70, or if you have a lot of readings over 200.

## 2014-10-18 ENCOUNTER — Other Ambulatory Visit: Payer: Self-pay

## 2014-10-26 ENCOUNTER — Telehealth: Payer: Self-pay | Admitting: Endocrinology

## 2014-10-26 MED ORDER — REPAGLINIDE 0.5 MG PO TABS: 0.5000 mg | ORAL_TABLET | Freq: Three times a day (TID) | ORAL | Status: DC

## 2014-10-26 MED ORDER — METFORMIN HCL ER 500 MG PO TB24: ORAL_TABLET | ORAL | Status: DC

## 2014-10-26 NOTE — Telephone Encounter (Signed)
See note below and please advise, thanks!

## 2014-10-26 NOTE — Telephone Encounter (Signed)
Left voicemail advising of note below. Requested call back if the pt would like to discuss.

## 2014-10-26 NOTE — Telephone Encounter (Signed)
Patient called stating that per Dr. Loanne Drilling he was going to [place] after she finished her rx She would like something cheaper  Please notify her either way    Pharmacy: [facility]   Thank you

## 2014-10-26 NOTE — Telephone Encounter (Signed)
Ok, i have sent prescriptions to your pharmacy, to change janumet to 2 prescriptions: repaglinide and metformin (both generic). please call if you have low sugar or if it stays over 200, as we may need to adjust the dosage of repaglinide.

## 2014-11-08 ENCOUNTER — Other Ambulatory Visit: Payer: Self-pay

## 2014-11-08 MED ORDER — ACCU-CHEK COMBO KIT: PACK | Status: DC

## 2014-11-08 MED ORDER — GLUCOSE BLOOD VI STRP: ORAL_STRIP | Status: DC

## 2014-11-09 ENCOUNTER — Other Ambulatory Visit: Payer: Self-pay | Admitting: Internal Medicine

## 2014-11-10 ENCOUNTER — Telehealth: Payer: Self-pay | Admitting: *Deleted

## 2014-11-10 NOTE — Telephone Encounter (Signed)
I contacted the pt and advised test strip form has been sent to her pharmacy. Pt voiced understanding.

## 2014-11-10 NOTE — Telephone Encounter (Signed)
Patient called, and said papers were sent from [facility], she said all he has to do is sign the bottom, he does not to send another prescription.

## 2014-11-29 ENCOUNTER — Other Ambulatory Visit: Payer: Self-pay | Admitting: Internal Medicine

## 2014-11-30 ENCOUNTER — Other Ambulatory Visit: Payer: Self-pay

## 2014-11-30 MED ORDER — SYNTHROID 150 MCG PO TABS: 150.0000 ug | ORAL_TABLET | Freq: Every day | ORAL | Status: DC

## 2014-12-29 ENCOUNTER — Encounter: Payer: Self-pay | Admitting: Endocrinology

## 2014-12-29 ENCOUNTER — Ambulatory Visit (INDEPENDENT_AMBULATORY_CARE_PROVIDER_SITE_OTHER): Payer: Medicare Other | Admitting: Endocrinology

## 2014-12-29 ENCOUNTER — Other Ambulatory Visit: Payer: Self-pay

## 2014-12-29 VITALS — BP 134/84 | HR 77 | Temp 97.8°F | Ht 64.25 in | Wt 184.0 lb

## 2014-12-29 DIAGNOSIS — Z23 Encounter for immunization: Secondary | ICD-10-CM

## 2014-12-29 DIAGNOSIS — E119 Type 2 diabetes mellitus without complications: Secondary | ICD-10-CM | POA: Diagnosis not present

## 2014-12-29 DIAGNOSIS — T383X5A Adverse effect of insulin and oral hypoglycemic [antidiabetic] drugs, initial encounter: Secondary | ICD-10-CM

## 2014-12-29 DIAGNOSIS — K521 Toxic gastroenteritis and colitis: Secondary | ICD-10-CM

## 2014-12-29 LAB — POCT GLYCOSYLATED HEMOGLOBIN (HGB A1C): Hemoglobin A1C: 6.1

## 2014-12-29 MED ORDER — METFORMIN HCL ER 500 MG PO TB24: 500.0000 mg | ORAL_TABLET | Freq: Two times a day (BID) | ORAL | Status: DC

## 2014-12-29 MED ORDER — SYNTHROID 150 MCG PO TABS: 150.0000 ug | ORAL_TABLET | Freq: Every day | ORAL | Status: DC

## 2014-12-29 NOTE — Progress Notes (Signed)
Subjective:    Patient ID: Breanna Webster, female    DOB: [birth year], 69 y.o.   MRN: [id number]  HPI Pt returns for f/u of diabetes mellitus: DM type: 2 Dx'ed: 6387 Complications: none Therapy: 3 oral meds GDM: never DKA: never Severe hypoglycemia: never Pancreatitis: never Other: she has never been on insulin Interval history: pt reports weight gain and diarrhea.  She takes meds as rx'ed.   Past Medical History  Diagnosis Date  . Diabetes mellitus   . GERD (gastroesophageal reflux disease)   . Grave's disease   . Hyperlipidemia   . Osteopenia   . Hypertension   . Carotid artery stenosis     Mild  . Thalassemia minor   . Arthritis   . Colon polyps   . Diverticulosis   . IBS (irritable bowel syndrome)   . Diverticulitis     Past Surgical History  Procedure Laterality Date  . Hysteroscopy      fibroids  . Laparoscopy      fibroids  . Cholecystectomy    . Hiatal hernia repair      Social History   Social History  . Marital Status: Married    Spouse Name: N/A  . Number of Children: 2  . Years of Education: N/A   Occupational History  . fundraising    Social History Main Topics  . Smoking status: Never Smoker   . Smokeless tobacco: Never Used  . Alcohol Use: No  . Drug Use: No  . Sexual Activity: Not on file   Other Topics Concern  . Not on file   Social History Narrative    Current Outpatient Prescriptions on File Prior to Visit  Medication Sig Dispense Refill  . amLODipine (NORVASC) 5 MG tablet take 1 tablet by mouth once daily 90 tablet 0  . aspirin 81 MG tablet Take 81 mg by mouth daily.      Marland Kitchen escitalopram (LEXAPRO) 20 MG tablet take 1 tablet by mouth once daily 90 tablet 3  . glucose blood (ACCU-CHEK COMFORT CURVE) test strip Use to check blood sugar 1 time per day. 100 each 2  . Insulin Infusion Pump (ACCU-CHEK COMBO) KIT Use to check blood 1 time per day 1 kit 2  . metoprolol succinate (TOPROL-XL) 100 MG 24 hr tablet TAKE 1 TABLET BY  MOUTH DAILY WITH OR IMMEDIATELY FOLLOWING A MEAL 90 tablet 1  . Multiple Vitamin (MULITIVITAMIN WITH MINERALS) TABS Take 1 tablet by mouth daily.    . nortriptyline (PAMELOR) 25 MG capsule Take 1 capsule (25 mg total) by mouth at bedtime. 90 capsule 1  . repaglinide (PRANDIN) 0.5 MG tablet Take 1 tablet (0.5 mg total) by mouth 3 (three) times daily before meals. 90 tablet 11  . valsartan (DIOVAN) 160 MG tablet take 1 tablet by mouth once daily 90 tablet 0  . calcium carbonate (OS-CAL) 600 MG TABS tablet Take 600 mg by mouth daily with breakfast.    . colesevelam (WELCHOL) 625 MG tablet Take 2 tablets (1,250 mg total) by mouth 2 (two) times daily with a meal. (Patient not taking: Reported on 12/29/2014) 360 tablet 1  . VITAMIN D, CHOLECALCIFEROL, PO Take 2,000 Units by mouth daily.     No current facility-administered medications on file prior to visit.    Allergies  Allergen Reactions  . Statins Other (See Comments)    Muscle aches    Family History  Problem Relation Age of Onset  . Adopted: Yes  . Colon cancer  Neg Hx   . Pancreatic cancer Neg Hx   . Rectal cancer Neg Hx   . Stomach cancer Neg Hx     BP 134/84 mmHg  Pulse 77  Temp(Src) 97.8 F (36.6 C) (Oral)  Ht 5' 4.25" (1.632 m)  Wt 184 lb (83.462 kg)  BMI 31.34 kg/m2  SpO2 93%  Review of Systems She denies hypoglycemia    Objective:   Physical Exam VITAL SIGNS:  See vs page GENERAL: no distress Pulses: dorsalis pedis intact bilat.   MSK: no deformity of the feet CV: no leg edema Skin:  no ulcer on the feet.  normal color and temp on the feet. Neuro: sensation is intact to touch on the feet   Lab Results  Component Value Date   HGBA1C 6.1 12/29/2014      Assessment & Plan:  DM: well-controlled Diarrhea, new, prob due to metformin.    Patient is advised the following: Patient Instructions  Please come back for a follow-up appointment in 2-3 months.   Please reduce the metformin.  i have sent a  prescription to your pharmacy.   Please continue the same other diabetes medications check your blood sugar once a day.  vary the time of day when you check, between before the 3 meals, and at bedtime.  also check if you have symptoms of your blood sugar being too high or too low.  please keep a record of the readings and bring it to your next appointment here.  You can write it on any piece of paper.  please call us sooner if your blood sugar goes below 70, or if you have a lot of readings over 200.

## 2014-12-29 NOTE — Patient Instructions (Addendum)
Please come back for a follow-up appointment in 2-3 months.   Please reduce the metformin.  i have sent a prescription to your pharmacy.   Please continue the same other diabetes medications check your blood sugar once a day.  vary the time of day when you check, between before the 3 meals, and at bedtime.  also check if you have symptoms of your blood sugar being too high or too low.  please keep a record of the readings and bring it to your next appointment here.  You can write it on any piece of paper.  please call us sooner if your blood sugar goes below 70, or if you have a lot of readings over 200.

## 2014-12-30 DIAGNOSIS — N8111 Cystocele, midline: Secondary | ICD-10-CM | POA: Diagnosis not present

## 2014-12-30 DIAGNOSIS — R3915 Urgency of urination: Secondary | ICD-10-CM | POA: Diagnosis not present

## 2014-12-30 DIAGNOSIS — N3281 Overactive bladder: Secondary | ICD-10-CM | POA: Diagnosis not present

## 2014-12-30 DIAGNOSIS — B373 Candidiasis of vulva and vagina: Secondary | ICD-10-CM | POA: Diagnosis not present

## 2014-12-30 DIAGNOSIS — N760 Acute vaginitis: Secondary | ICD-10-CM | POA: Diagnosis not present

## 2014-12-31 DIAGNOSIS — H0015 Chalazion left lower eyelid: Secondary | ICD-10-CM | POA: Diagnosis not present

## 2015-01-10 DIAGNOSIS — H2513 Age-related nuclear cataract, bilateral: Secondary | ICD-10-CM | POA: Diagnosis not present

## 2015-01-10 DIAGNOSIS — E119 Type 2 diabetes mellitus without complications: Secondary | ICD-10-CM | POA: Diagnosis not present

## 2015-01-10 LAB — HM DIABETES EYE EXAM

## 2015-01-13 ENCOUNTER — Encounter: Payer: Self-pay | Admitting: Endocrinology

## 2015-01-26 ENCOUNTER — Other Ambulatory Visit: Payer: Self-pay | Admitting: Internal Medicine

## 2015-01-26 DIAGNOSIS — Z01419 Encounter for gynecological examination (general) (routine) without abnormal findings: Secondary | ICD-10-CM | POA: Diagnosis not present

## 2015-01-26 DIAGNOSIS — M8580 Other specified disorders of bone density and structure, unspecified site: Secondary | ICD-10-CM | POA: Diagnosis not present

## 2015-01-26 DIAGNOSIS — Z124 Encounter for screening for malignant neoplasm of cervix: Secondary | ICD-10-CM | POA: Diagnosis not present

## 2015-01-26 DIAGNOSIS — N898 Other specified noninflammatory disorders of vagina: Secondary | ICD-10-CM | POA: Diagnosis not present

## 2015-01-26 DIAGNOSIS — R29890 Loss of height: Secondary | ICD-10-CM | POA: Diagnosis not present

## 2015-01-27 ENCOUNTER — Other Ambulatory Visit: Payer: Self-pay

## 2015-01-27 MED ORDER — NORTRIPTYLINE HCL 25 MG PO CAPS: 25.0000 mg | ORAL_CAPSULE | Freq: Every day | ORAL | Status: DC

## 2015-02-08 ENCOUNTER — Other Ambulatory Visit: Payer: Self-pay | Admitting: Obstetrics

## 2015-02-08 DIAGNOSIS — E2839 Other primary ovarian failure: Secondary | ICD-10-CM

## 2015-02-08 DIAGNOSIS — M8580 Other specified disorders of bone density and structure, unspecified site: Secondary | ICD-10-CM

## 2015-02-24 ENCOUNTER — Encounter: Payer: Self-pay | Admitting: Family Medicine

## 2015-02-24 ENCOUNTER — Ambulatory Visit (INDEPENDENT_AMBULATORY_CARE_PROVIDER_SITE_OTHER): Payer: Medicare Other | Admitting: Family Medicine

## 2015-02-24 VITALS — BP 136/82 | HR 80 | Temp 98.6°F | Ht 64.25 in | Wt 193.6 lb

## 2015-02-24 DIAGNOSIS — E669 Obesity, unspecified: Secondary | ICD-10-CM

## 2015-02-24 DIAGNOSIS — E039 Hypothyroidism, unspecified: Secondary | ICD-10-CM

## 2015-02-24 DIAGNOSIS — I10 Essential (primary) hypertension: Secondary | ICD-10-CM

## 2015-02-24 DIAGNOSIS — R609 Edema, unspecified: Secondary | ICD-10-CM | POA: Diagnosis not present

## 2015-02-24 MED ORDER — HYDROCHLOROTHIAZIDE 12.5 MG PO TABS: 12.5000 mg | ORAL_TABLET | Freq: Every day | ORAL | Status: DC

## 2015-02-24 NOTE — Patient Instructions (Signed)
Stop the norvasc  Start the hydrochlorothiazide daily  Follow up as scheduled and as needed  We recommend the following healthy lifestyle measures: - eat a healthy whole foods diet consisting of regular small meals composed of vegetables, fruits, beans, nuts, seeds, healthy meats such as white chicken and fish and whole grains.  - avoid sweets, white starchy foods, fried foods, fast food, processed foods, sodas, red meet and other fattening foods.  - get a least 150-300 minutes of aerobic exercise per week.

## 2015-02-24 NOTE — Progress Notes (Signed)
Pre visit review using our clinic review tool, if applicable. No additional management support is needed unless otherwise documented below in the visit note. 

## 2015-02-24 NOTE — Progress Notes (Signed)
HPI:  Acute visit for: -bilat le edema -she and her husband feel it is from one of her new medicines started in the last few months - norvasc -she also quit exercising for the last 2 months due to stress and other activities - used to go to the gym on a regular basis -has noticed ankle edema bilat and wt gain over the last few months -denies: CP, SOB, DOE, palpitations, fevers, malaise -sees endo for thyroid and diabetes, sees Dr. Elease Hashimoto now she says for primary care and reports has physical in 1 month to check labs -has echo earlier this year that was normal other then mild diastolic dysfunction  ROS: See pertinent positives and negatives per HPI.  Past Medical History  Diagnosis Date  . Diabetes mellitus   . GERD (gastroesophageal reflux disease)   . Grave's disease   . Hyperlipidemia   . Osteopenia   . Hypertension   . Carotid artery stenosis     Mild  . Thalassemia minor   . Arthritis   . Colon polyps   . Diverticulosis   . IBS (irritable bowel syndrome)   . Diverticulitis     Past Surgical History  Procedure Laterality Date  . Hysteroscopy      fibroids  . Laparoscopy      fibroids  . Cholecystectomy    . Hiatal hernia repair      Family History  Problem Relation Age of Onset  . Adopted: Yes  . Colon cancer Neg Hx   . Pancreatic cancer Neg Hx   . Rectal cancer Neg Hx   . Stomach cancer Neg Hx     Social History   Social History  . Marital Status: Married    Spouse Name: N/A  . Number of Children: 2  . Years of Education: N/A   Occupational History  . fundraising    Social History Main Topics  . Smoking status: Never Smoker   . Smokeless tobacco: Never Used  . Alcohol Use: No  . Drug Use: No  . Sexual Activity: Not Asked   Other Topics Concern  . None   Social History Narrative     Current outpatient prescriptions:  .  aspirin 81 MG tablet, Take 81 mg by mouth daily.  , Disp: , Rfl:  .  calcium carbonate (OS-CAL) 600 MG TABS  tablet, Take 600 mg by mouth daily with breakfast., Disp: , Rfl:  .  colesevelam (WELCHOL) 625 MG tablet, Take 2 tablets (1,250 mg total) by mouth 2 (two) times daily with a meal., Disp: 360 tablet, Rfl: 1 .  escitalopram (LEXAPRO) 20 MG tablet, take 1 tablet by mouth once daily, Disp: 90 tablet, Rfl: 3 .  glucose blood (ACCU-CHEK COMFORT CURVE) test strip, Use to check blood sugar 1 time per day., Disp: 100 each, Rfl: 2 .  Insulin Infusion Pump (ACCU-CHEK COMBO) KIT, Use to check blood 1 time per day, Disp: 1 kit, Rfl: 2 .  metFORMIN (GLUCOPHAGE-XR) 500 MG 24 hr tablet, Take 1 tablet (500 mg total) by mouth 2 (two) times daily., Disp: 120 tablet, Rfl: 11 .  metoprolol succinate (TOPROL-XL) 100 MG 24 hr tablet, TAKE 1 TABLET BY MOUTH DAILY WITH OR IMMEDIATELY FOLLOWING A MEAL, Disp: 90 tablet, Rfl: 1 .  Multiple Vitamin (MULITIVITAMIN WITH MINERALS) TABS, Take 1 tablet by mouth daily., Disp: , Rfl:  .  nortriptyline (PAMELOR) 25 MG capsule, take 1 capsule by mouth at bedtime, Disp: 90 capsule, Rfl: 0 .  nortriptyline (PAMELOR)  25 MG capsule, Take 1 capsule (25 mg total) by mouth at bedtime., Disp: 90 capsule, Rfl: 2 .  repaglinide (PRANDIN) 0.5 MG tablet, Take 1 tablet (0.5 mg total) by mouth 3 (three) times daily before meals., Disp: 90 tablet, Rfl: 11 .  SYNTHROID 150 MCG tablet, Take 1 tablet (150 mcg total) by mouth daily., Disp: 90 tablet, Rfl: 3 .  valsartan (DIOVAN) 160 MG tablet, take 1 tablet by mouth once daily, Disp: 90 tablet, Rfl: 0 .  VITAMIN D, CHOLECALCIFEROL, PO, Take 2,000 Units by mouth daily., Disp: , Rfl:  .  hydrochlorothiazide (HYDRODIURIL) 12.5 MG tablet, Take 1 tablet (12.5 mg total) by mouth daily., Disp: 30 tablet, Rfl: 2  EXAM:  Filed Vitals:   02/24/15 1312  BP: 136/82  Pulse: 80  Temp: 98.6 F (37 C)    Body mass index is 32.97 kg/(m^2).  GENERAL: vitals reviewed and listed above, alert, oriented, appears well hydrated and in no acute distress  HEENT:  atraumatic, conjunttiva clear, no obvious abnormalities on inspection of external nose and ears  NECK: no obvious masses on inspection  LUNGS: clear to auscultation bilaterally, no wheezes, rales or rhonchi, good air movement  CV: HRRR, tr bilat ankle edema  MS: moves all extremities without noticeable abnormality  PSYCH: pleasant and cooperative, no obvious depression or anxiety  ASSESSMENT AND PLAN:  Discussed the following assessment and plan:  Edema, unspecified type  OBESITY  Hypothyroidism, unspecified hypothyroidism type  Essential hypertension, benign  -discussed various causes of ankle edema - this is a pretty common side effect with norvasc and she has opted to stop this to see if this helps, discussed other options for BP and decided to start hctz -she also has gone from exercising a lot to not exercising and has been under some stress - healthy lifestyle discussed -reviewed recent echo and given no other symptoms or findings on exam, doubt cardiac or thyroid related issue but did advise to follow up immediately if worsening or new symptoms -follow up as scheduled in 1 month and she plans to do labs then -Patient advised to return or notify a doctor immediately if symptoms worsen or persist or new concerns arise.  Patient Instructions  Stop the norvasc  Start the hydrochlorothiazide daily  Follow up as scheduled and as needed  We recommend the following healthy lifestyle measures: - eat a healthy whole foods diet consisting of regular small meals composed of vegetables, fruits, beans, nuts, seeds, healthy meats such as white chicken and fish and whole grains.  - avoid sweets, white starchy foods, fried foods, fast food, processed foods, sodas, red meet and other fattening foods.  - get a least 150-300 minutes of aerobic exercise per week.       Colin Benton R.

## 2015-02-28 DIAGNOSIS — L821 Other seborrheic keratosis: Secondary | ICD-10-CM | POA: Diagnosis not present

## 2015-02-28 DIAGNOSIS — L739 Follicular disorder, unspecified: Secondary | ICD-10-CM | POA: Diagnosis not present

## 2015-03-04 ENCOUNTER — Encounter: Payer: Medicare Other | Admitting: Internal Medicine

## 2015-03-08 ENCOUNTER — Other Ambulatory Visit: Payer: Self-pay

## 2015-03-08 DIAGNOSIS — Z1231 Encounter for screening mammogram for malignant neoplasm of breast: Secondary | ICD-10-CM

## 2015-03-09 ENCOUNTER — Encounter: Payer: Self-pay | Admitting: Endocrinology

## 2015-03-09 ENCOUNTER — Ambulatory Visit (INDEPENDENT_AMBULATORY_CARE_PROVIDER_SITE_OTHER): Payer: Medicare Other | Admitting: Endocrinology

## 2015-03-09 VITALS — BP 130/80 | HR 84 | Temp 98.7°F | Ht 64.5 in | Wt 187.0 lb

## 2015-03-09 DIAGNOSIS — E119 Type 2 diabetes mellitus without complications: Secondary | ICD-10-CM | POA: Diagnosis not present

## 2015-03-09 LAB — POCT GLYCOSYLATED HEMOGLOBIN (HGB A1C): Hemoglobin A1C: 6.6

## 2015-03-09 MED ORDER — BROMOCRIPTINE MESYLATE 2.5 MG PO TABS: ORAL_TABLET | ORAL | Status: DC

## 2015-03-09 MED ORDER — METFORMIN HCL ER 500 MG PO TB24: 500.0000 mg | ORAL_TABLET | Freq: Two times a day (BID) | ORAL | Status: DC

## 2015-03-09 NOTE — Patient Instructions (Addendum)
Please come back for a follow-up appointment in 3 months.   Please add "bromocriptine," to help your blood sugar. It has possible side effects of nausea and dizziness.  These go away with time.  You can avoid these by taking it at bedtime, and by taking just take 1/4 of a pill.   Please continue the same other diabetes medications.   check your blood sugar once a day.  vary the time of day when you check, between before the 3 meals, and at bedtime.  also check if you have symptoms of your blood sugar being too high or too low.  please keep a record of the readings and bring it to your next appointment here.  You can write it on any piece of paper.  please call us sooner if your blood sugar goes below 70, or if you have a lot of readings over 200.

## 2015-03-09 NOTE — Progress Notes (Signed)
Subjective:    Patient ID: Breanna Webster, female    DOB: 01-27-1946, 69 y.o.   MRN: [id number]  HPI Pt returns for f/u of diabetes mellitus: DM type: 2 Dx'ed: 8676 Complications: none.   Therapy: 3 oral meds.   GDM: never.   DKA: never Severe hypoglycemia: never.  Pancreatitis: never.  Other: she has never been on insulin; metformin dosage is limited by diarrhea; pioglitizone is precluded by edema; pt requests generic meds only Interval history: She takes meds as rx'ed.  Diarrhea resolved with reduction of metformin.  pt states she feels well in general, except for weight gain.  Past Medical History  Diagnosis Date  . Diabetes mellitus   . GERD (gastroesophageal reflux disease)   . Grave's disease   . Hyperlipidemia   . Osteopenia   . Hypertension   . Carotid artery stenosis     Mild  . Thalassemia minor   . Arthritis   . Colon polyps   . Diverticulosis   . IBS (irritable bowel syndrome)   . Diverticulitis     Past Surgical History  Procedure Laterality Date  . Hysteroscopy      fibroids  . Laparoscopy      fibroids  . Cholecystectomy    . Hiatal hernia repair      Social History   Social History  . Marital Status: Married    Spouse Name: N/A  . Number of Children: 2  . Years of Education: N/A   Occupational History  . fundraising    Social History Main Topics  . Smoking status: Never Smoker   . Smokeless tobacco: Never Used  . Alcohol Use: No  . Drug Use: No  . Sexual Activity: Not on file   Other Topics Concern  . Not on file   Social History Narrative    Current Outpatient Prescriptions on File Prior to Visit  Medication Sig Dispense Refill  . aspirin 81 MG tablet Take 81 mg by mouth daily.      . calcium carbonate (OS-CAL) 600 MG TABS tablet Take 600 mg by mouth daily with breakfast.    . escitalopram (LEXAPRO) 20 MG tablet take 1 tablet by mouth once daily 90 tablet 3  . glucose blood (ACCU-CHEK COMFORT CURVE) test strip Use to check  blood sugar 1 time per day. 100 each 2  . hydrochlorothiazide (HYDRODIURIL) 12.5 MG tablet Take 1 tablet (12.5 mg total) by mouth daily. 30 tablet 2  . Insulin Infusion Pump (ACCU-CHEK COMBO) KIT Use to check blood 1 time per day 1 kit 2  . metoprolol succinate (TOPROL-XL) 100 MG 24 hr tablet TAKE 1 TABLET BY MOUTH DAILY WITH OR IMMEDIATELY FOLLOWING A MEAL 90 tablet 1  . Multiple Vitamin (MULITIVITAMIN WITH MINERALS) TABS Take 1 tablet by mouth daily.    . nortriptyline (PAMELOR) 25 MG capsule take 1 capsule by mouth at bedtime 90 capsule 0  . repaglinide (PRANDIN) 0.5 MG tablet Take 1 tablet (0.5 mg total) by mouth 3 (three) times daily before meals. 90 tablet 11  . SYNTHROID 150 MCG tablet Take 1 tablet (150 mcg total) by mouth daily. 90 tablet 3  . valsartan (DIOVAN) 160 MG tablet take 1 tablet by mouth once daily 90 tablet 0  . VITAMIN D, CHOLECALCIFEROL, PO Take 2,000 Units by mouth daily.     No current facility-administered medications on file prior to visit.    Allergies  Allergen Reactions  . Statins Other (See Comments)  Muscle aches    Family History  Problem Relation Age of Onset  . Adopted: Yes  . Colon cancer Neg Hx   . Pancreatic cancer Neg Hx   . Rectal cancer Neg Hx   . Stomach cancer Neg Hx     BP 130/80 mmHg  Pulse 84  Temp(Src) 98.7 F (37.1 C) (Oral)  Ht 5' 4.5" (1.638 m)  Wt 187 lb (84.823 kg)  BMI 31.61 kg/m2  SpO2 94%  Review of Systems She denies hypoglycemia    Objective:   Physical Exam VITAL SIGNS:  See vs page GENERAL: no distress Pulses: dorsalis pedis intact bilat.   MSK: no deformity of the feet CV: no leg edema Skin:  no ulcer on the feet.  normal color and temp on the feet. Neuro: sensation is intact to touch on the feet   A1c=6.6%    Assessment & Plan:  DM: she needs increased rx, if it can be done with a regimen that avoids or minimizes hypoglycemia.    Patient is advised the following: Patient Instructions  Please come  back for a follow-up appointment in 3 months.   Please add "bromocriptine," to help your blood sugar. It has possible side effects of nausea and dizziness.  These go away with time.  You can avoid these by taking it at bedtime, and by taking just take 1/4 of a pill.   Please continue the same other diabetes medications.   check your blood sugar once a day.  vary the time of day when you check, between before the 3 meals, and at bedtime.  also check if you have symptoms of your blood sugar being too high or too low.  please keep a record of the readings and bring it to your next appointment here.  You can write it on any piece of paper.  please call us sooner if your blood sugar goes below 70, or if you have a lot of readings over 200.

## 2015-03-10 ENCOUNTER — Ambulatory Visit
Admission: RE | Admit: 2015-03-10 | Discharge: 2015-03-10 | Disposition: A | Discharge status: Home / Self Care | Payer: Medicare Other | Source: Ambulatory Visit | Attending: Obstetrics | Admitting: Obstetrics

## 2015-03-10 DIAGNOSIS — Z780 Asymptomatic menopausal state: Secondary | ICD-10-CM | POA: Diagnosis not present

## 2015-03-10 DIAGNOSIS — M8589 Other specified disorders of bone density and structure, multiple sites: Secondary | ICD-10-CM | POA: Diagnosis not present

## 2015-03-10 DIAGNOSIS — E2839 Other primary ovarian failure: Secondary | ICD-10-CM

## 2015-03-20 ENCOUNTER — Other Ambulatory Visit: Payer: Self-pay | Admitting: Internal Medicine

## 2015-03-29 ENCOUNTER — Encounter: Payer: Self-pay | Admitting: Family Medicine

## 2015-03-29 ENCOUNTER — Ambulatory Visit (INDEPENDENT_AMBULATORY_CARE_PROVIDER_SITE_OTHER): Payer: Medicare Other | Admitting: Family Medicine

## 2015-03-29 VITALS — BP 110/80 | HR 77 | Temp 98.3°F | Resp 14 | Ht 64.25 in | Wt 189.9 lb

## 2015-03-29 DIAGNOSIS — M25562 Pain in left knee: Secondary | ICD-10-CM

## 2015-03-29 DIAGNOSIS — K219 Gastro-esophageal reflux disease without esophagitis: Secondary | ICD-10-CM

## 2015-03-29 DIAGNOSIS — M79604 Pain in right leg: Secondary | ICD-10-CM

## 2015-03-29 DIAGNOSIS — Z0000 Encounter for general adult medical examination without abnormal findings: Secondary | ICD-10-CM | POA: Diagnosis not present

## 2015-03-29 DIAGNOSIS — E785 Hyperlipidemia, unspecified: Secondary | ICD-10-CM

## 2015-03-29 LAB — HEPATIC FUNCTION PANEL
ALT: 59 U/L — ABNORMAL HIGH (ref 0–35)
AST: 50 U/L — ABNORMAL HIGH (ref 0–37)
Albumin: 4 g/dL (ref 3.5–5.2)
Alkaline Phosphatase: 71 U/L (ref 39–117)
Bilirubin, Direct: 0.2 mg/dL (ref 0.0–0.3)
Total Bilirubin: 0.8 mg/dL (ref 0.2–1.2)
Total Protein: 7.1 g/dL (ref 6.0–8.3)

## 2015-03-29 LAB — LIPID PANEL
Cholesterol: 161 mg/dL (ref 0–200)
HDL: 39 mg/dL — ABNORMAL LOW (ref >39.00)
LDL Cholesterol: 99 mg/dL (ref 0–99)
NonHDL: 121.93
Total CHOL/HDL Ratio: 4
Triglycerides: 115 mg/dL (ref 0.0–149.0)
VLDL: 23 mg/dL (ref 0.0–40.0)

## 2015-03-29 NOTE — Progress Notes (Addendum)
Subjective:    Patient ID: Breanna Webster, female    DOB: 05/04/1945, 69 y.o.   MRN: [id number]  HPI   Patient here for Medicare wellness exam. I'm seeing her in absence her primary provider who is out. She has history of reported hypertension, nonalcoholic fatty liver disease, IBS, hypothyroidism, type 2 diabetes, osteopenia. She is followed by endocrinology regarding her thyroid and diabetes which has been well controlled.  She has past history of GERD and had Nissen fundoplication about 17 years ago. She is recently had some breakthrough reflux symptoms started herself on Prilosec. She is requesting referral to GI this point for further evaluation. She denies any dysphagia. She is not aware of any prior history of Barrett's esophagus. No appetite or weight changes.  She is complaining of some right anterior leg pain from about upper third of the leg to just above ankle region. She describes a sharp lancinating radiating pain with no clear provoking factors. This sometimes occurs at rest. She denies any lower extremity numbness or weakness. No low back pain. Type 2 diabetes but this has been very well controlled. Denies any left leg symptoms.  Also complaining of several month history of progressive bilateral knee pains left greater than right with early morning stiffness. She has never noted inflammatory changes such as warmth or redness. No history of recent effusion.  Past Medical History  Diagnosis Date  . Diabetes mellitus   . GERD (gastroesophageal reflux disease)   . Grave's disease   . Hyperlipidemia   . Osteopenia   . Hypertension   . Carotid artery stenosis     Mild  . Thalassemia minor   . Arthritis   . Colon polyps   . Diverticulosis   . IBS (irritable bowel syndrome)   . Diverticulitis    Past Surgical History  Procedure Laterality Date  . Hysteroscopy      fibroids  . Laparoscopy      fibroids  . Cholecystectomy    . Hiatal hernia repair      reports that  she has never smoked. She has never used smokeless tobacco. She reports that she does not drink alcohol or use illicit drugs. family history is negative for Colon cancer, Pancreatic cancer, Rectal cancer, and Stomach cancer. She was adopted. Allergies  Allergen Reactions  . Statins Other (See Comments)    Muscle aches   1.  Risk factors based on Past Medical , Social, and Family history reviewed and as indicated above with no changes 2.  Limitations in physical activities None.  No recent falls. 3.  Depression/mood No active depression or anxiety issues 4.  Hearing No defiits 5.  ADLs independent in all. 6.  Cognitive function (orientation to time and place, language, writing, speech,memory) no short or long term memory issues.  Language and judgement intact. 7.  Home Safety no issues 8.  Height, weight, and visual acuity.all stable. 9.  Counseling discussed importance of regular weight bearing exercise and daily calcium and Vit D.  10. Recommendation of preventive services. Confirm prior Prevnar 13.  Other immunizations up to date. 11. Labs based on risk factors lipid and hepatic panel. 12. Care Plan as above. 13. Other Providers Dr Gerrie Nordmann. 14. Written schedule of screening/prevention services given to patient.    Review of Systems  Constitutional: Negative for fatigue.  HENT: Negative for congestion and trouble swallowing.   Eyes: Negative for visual disturbance.  Respiratory: Negative for cough, chest tightness, shortness of breath and wheezing.  Cardiovascular: Negative for chest pain, palpitations and leg swelling.  Gastrointestinal: Negative for nausea, vomiting, abdominal pain, diarrhea, constipation, blood in stool and abdominal distention.  Genitourinary: Negative for dysuria.  Musculoskeletal: Positive for arthralgias.  Neurological: Negative for dizziness, seizures, syncope, weakness, light-headedness and headaches.  Hematological: Negative for adenopathy.        Objective:   Physical Exam  Constitutional: She is oriented to person, place, and time. She appears well-developed and well-nourished.  HENT:  Right Ear: External ear normal.  Left Ear: External ear normal.  Mouth/Throat: Oropharynx is clear and moist.  Neck: Neck supple. No thyromegaly present.  Cardiovascular: Normal rate and regular rhythm.   Pulmonary/Chest: Effort normal and breath sounds normal. No respiratory distress. She has no wheezes. She has no rales.  Abdominal: Soft. Bowel sounds are normal. She exhibits no distension and no mass. There is no tenderness. There is no rebound and no guarding.  Musculoskeletal: She exhibits no edema.  Lymphadenopathy:    She has no cervical adenopathy.  Neurological: She is alert and oriented to person, place, and time. No cranial nerve deficit.  Psychiatric: She has a normal mood and affect. Her behavior is normal.          Assessment & Plan:  #1 Medicare wellness visit. Patient thinks she has had previous Prevnar but cannot be sure. She apparently received this as a pharmacy. She will try to confirm. Her other immunizations are up-to-date. She will schedule yearly mammogram. Continue regular calcium and vitamin D consumption. She continues to see GYN  #2 history of GERD and remote history of Nissen fundoplication. Recent breakthrough GERD symptoms. Patient requesting referral back to GI though she has no significant red flag worrisome symptoms such as dysphagia, appetite or weight changes at this time.  #3 right leg pain. Non-focal exam. No evidence for weakness. Question neuropathic.  Consider neurology referral if symptoms persist #4 bilateral knee pain left greater than right. Suspect osteoarthritis. No recent x-rays.  Get baseline x-ray left knee.

## 2015-03-29 NOTE — Progress Notes (Signed)
Pre visit review using our clinic review tool, if applicable. No additional management support is needed unless otherwise documented below in the visit note. 

## 2015-03-29 NOTE — Patient Instructions (Signed)
Food Choices for Gastroesophageal Reflux Disease, Adult When you have gastroesophageal reflux disease (GERD), the foods you eat and your eating habits are very important. Choosing the right foods can help ease the discomfort of GERD. WHAT GENERAL GUIDELINES DO I NEED TO FOLLOW?  Choose fruits, vegetables, whole grains, low-fat dairy products, and low-fat meat, fish, and poultry.  Limit fats such as oils, salad dressings, butter, nuts, and avocado.  Keep a food diary to identify foods that cause symptoms.  Avoid foods that cause reflux. These may be different for different people.  Eat frequent small meals instead of three large meals each day.  Eat your meals slowly, in a relaxed setting.  Limit fried foods.  Cook foods using methods other than frying.  Avoid drinking alcohol.  Avoid drinking large amounts of liquids with your meals.  Avoid bending over or lying down until 2-3 hours after eating. WHAT FOODS ARE NOT RECOMMENDED? The following are some foods and drinks that may worsen your symptoms: Vegetables Tomatoes. Tomato juice. Tomato and spaghetti sauce. Chili peppers. Onion and garlic. Horseradish. Fruits Oranges, grapefruit, and lemon (fruit and juice). Meats High-fat meats, fish, and poultry. This includes hot dogs, ribs, ham, sausage, salami, and bacon. Dairy Whole milk and chocolate milk. Sour cream. Cream. Butter. Ice cream. Cream cheese.  Beverages Coffee and tea, with or without caffeine. Carbonated beverages or energy drinks. Condiments Hot sauce. Barbecue sauce.  Sweets/Desserts Chocolate and cocoa. Donuts. Peppermint and spearmint. Fats and Oils High-fat foods, including [place] fries and potato chips. Other Vinegar. Strong spices, such as black pepper, white pepper, red pepper, cayenne, curry powder, cloves, ginger, and chili powder. The items listed above may not be a complete list of foods and beverages to avoid. Contact your dietitian for more  information.   This information is not intended to replace advice given to you by your health care provider. Make sure you discuss any questions you have with your health care provider.   Document Released: 04/09/2005 Document Revised: 04/30/2014 Document Reviewed: 02/11/2013 Elsevier Interactive Patient Education 2016 [place] Maintenance  Topic Date Due  . Hepatitis C Screening  1946-01-06  . PNA vac Low Risk Adult (2 of 2 - PCV13) 01/29/2015  . URINE MICROALBUMIN  02/09/2015  . HEMOGLOBIN A1C  09/06/2015  . INFLUENZA VACCINE  11/22/2015  . OPHTHALMOLOGY EXAM  01/10/2016  . FOOT EXAM  03/08/2016  . MAMMOGRAM  03/26/2016  . COLONOSCOPY  08/06/2016  . TETANUS/TDAP  12/28/2024  . DEXA SCAN  Completed  . ZOSTAVAX  Completed

## 2015-04-04 ENCOUNTER — Other Ambulatory Visit (INDEPENDENT_AMBULATORY_CARE_PROVIDER_SITE_OTHER): Payer: Medicare Other

## 2015-04-04 ENCOUNTER — Other Ambulatory Visit: Payer: Self-pay | Admitting: Internal Medicine

## 2015-04-04 ENCOUNTER — Ambulatory Visit (INDEPENDENT_AMBULATORY_CARE_PROVIDER_SITE_OTHER)
Admission: RE | Admit: 2015-04-04 | Discharge: 2015-04-04 | Disposition: A | Discharge status: Home / Self Care | Payer: Medicare Other | Source: Ambulatory Visit | Attending: Family Medicine | Admitting: Family Medicine

## 2015-04-04 DIAGNOSIS — M25562 Pain in left knee: Secondary | ICD-10-CM | POA: Diagnosis not present

## 2015-04-04 DIAGNOSIS — R899 Unspecified abnormal finding in specimens from other organs, systems and tissues: Secondary | ICD-10-CM

## 2015-04-04 DIAGNOSIS — G5790 Unspecified mononeuropathy of unspecified lower limb: Secondary | ICD-10-CM

## 2015-04-04 LAB — HEPATIC FUNCTION PANEL
ALT: 49 U/L — ABNORMAL HIGH (ref 0–35)
AST: 39 U/L — ABNORMAL HIGH (ref 0–37)
Albumin: 3.9 g/dL (ref 3.5–5.2)
Alkaline Phosphatase: 66 U/L (ref 39–117)
Bilirubin, Direct: 0.1 mg/dL (ref 0.0–0.3)
Total Bilirubin: 0.7 mg/dL (ref 0.2–1.2)
Total Protein: 6.6 g/dL (ref 6.0–8.3)

## 2015-04-12 ENCOUNTER — Ambulatory Visit
Admission: RE | Admit: 2015-04-12 | Discharge: 2015-04-12 | Disposition: A | Discharge status: Home / Self Care | Payer: Medicare Other | Source: Ambulatory Visit

## 2015-04-12 DIAGNOSIS — Z1231 Encounter for screening mammogram for malignant neoplasm of breast: Secondary | ICD-10-CM

## 2015-05-04 ENCOUNTER — Other Ambulatory Visit: Payer: Self-pay | Admitting: *Deleted

## 2015-05-04 ENCOUNTER — Other Ambulatory Visit: Payer: Self-pay | Admitting: Internal Medicine

## 2015-05-04 MED ORDER — HYDROCHLOROTHIAZIDE 12.5 MG PO TABS: 12.5000 mg | ORAL_TABLET | Freq: Every day | ORAL | Status: DC

## 2015-05-04 NOTE — Telephone Encounter (Signed)
Rx done. 

## 2015-05-05 ENCOUNTER — Other Ambulatory Visit: Payer: Self-pay | Admitting: Internal Medicine

## 2015-05-27 ENCOUNTER — Ambulatory Visit (INDEPENDENT_AMBULATORY_CARE_PROVIDER_SITE_OTHER): Payer: Medicare Other | Admitting: Neurology

## 2015-05-27 ENCOUNTER — Encounter: Payer: Self-pay | Admitting: Neurology

## 2015-05-27 VITALS — BP 120/70 | HR 76 | Ht 64.25 in | Wt 192.4 lb

## 2015-05-27 DIAGNOSIS — M79604 Pain in right leg: Secondary | ICD-10-CM | POA: Diagnosis not present

## 2015-05-27 NOTE — Patient Instructions (Signed)
We will order NCS/EMG of the right leg and call you with the results.

## 2015-05-27 NOTE — Progress Notes (Signed)
Breanna Webster, Breanna Webster was accompanied to the clinic by self.    History of Present Illness: Breanna Webster is a 70 y.o. right-handed female with diabetes mellitus, hypertension, hyperlipidemia, and Graves's disease  presenting for evaluation of right leg pain.    Around the summer of 2016, she began having bilateral ankle swelling.  Around the fall of 2016, she started experiencing hypersensitivity over the right anterior thigh down to the top of her ankle, it's a very localized and linear area immediately over the shin and "feels alike a hot bruise".  She has similar over the sides of her knees bilaterally.  Symptoms are intermittent and triggered by resting on her knees and her right leg discomfort is triggered by walking.  Pain lasts a few minutes and she has instant pain relief, if she lifts the right leg up. There is no numbness/tingling or weakness.  She does not have back pain and pain is not exacerbated for ankle movement such as plantar flexion or dorsiflexion.  She does not have similar pain over the left leg.  Out-side paper records, electronic medical record, and images have been reviewed where available and summarized as:  Lab Results  Component Value Date   HGBA1C 6.6 03/09/2015   Lab Results  Component Value Date   TSH 0.79 09/28/2014    Past Medical History  Diagnosis Date  . Diabetes mellitus   . GERD (gastroesophageal reflux disease)   . Grave's disease   . Hyperlipidemia   . Osteopenia   . Hypertension   . Carotid artery stenosis     Mild  . Thalassemia minor   . Arthritis   . Colon polyps   .  Diverticulosis   . IBS (irritable bowel syndrome)   . Diverticulitis     Past Surgical History  Procedure Laterality Date  . Hysteroscopy      fibroids  . Laparoscopy      fibroids  . Cholecystectomy    . Hiatal hernia repair       Medications:  Outpatient Encounter Prescriptions as of 05/27/2015  Medication Sig  . aspirin 81 MG tablet Take 81 mg by mouth daily.    . bromocriptine (PARLODEL) 2.5 MG tablet 1/4 tab daily  . calcium carbonate (OS-CAL) 600 MG TABS tablet Take 600 mg by mouth daily with breakfast.  . escitalopram (LEXAPRO) 20 MG tablet take 1 tablet by mouth once daily  . glucose blood (ACCU-CHEK COMFORT CURVE) test strip Use to check blood sugar 1 time per day.  . hydrochlorothiazide (HYDRODIURIL) 12.5 MG tablet Take 1 tablet (12.5 mg total) by mouth daily.  . Insulin Infusion Pump (ACCU-CHEK COMBO) KIT Use to check blood 1 time per day  . metFORMIN (GLUCOPHAGE-XR) 500 MG 24 hr tablet Take 1 tablet (500 mg total) by mouth 2 (two) times daily.  . metoprolol succinate (TOPROL-XL) 100 MG 24 hr tablet take 1 tablet by mouth once daily WITH OR IMMEDIATELY FOLLOWING A MEAL  . Multiple Vitamin (MULITIVITAMIN WITH MINERALS) TABS Take 1 tablet by mouth daily.  . nortriptyline (PAMELOR) 25 MG capsule take 1 capsule by mouth at bedtime  .  repaglinide (PRANDIN) 0.5 MG tablet Take 1 tablet (0.5 mg total) by mouth 3 (three) times daily before meals.  Marland Kitchen SYNTHROID 150 MCG tablet Take 1 tablet (150 mcg total) by mouth daily.  . valsartan (DIOVAN) 160 MG tablet take 1 tablet by mouth once daily  . VITAMIN D, CHOLECALCIFEROL, PO Take 2,000 Units by mouth daily.   No facility-administered encounter medications on file as of 05/27/2015.     Allergies:  Allergies  Allergen Reactions  . Statins Other (See Comments)    Muscle aches    Family History: Family History  Problem Relation Age of Onset  . Adopted: Yes  . Colon cancer Neg Hx   . Pancreatic cancer Neg Hx   . Rectal cancer  Neg Hx   . Stomach cancer Neg Hx   . Healthy Son     x 2  . Headache      Cluster headaches    Social History: Social History  Substance Use Topics  . Smoking status: Never Smoker   . Smokeless tobacco: Never Used  . Alcohol Use: No   Social History   Social History Narrative   Lives with husband in a one story home.     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   Education: college.    Review of Systems:  CONSTITUTIONAL: No fevers, chills, night sweats, or weight loss.   EYES: No visual changes or eye pain ENT: No hearing changes.  No history of nose bleeds.   RESPIRATORY: No cough, wheezing and shortness of breath.   CARDIOVASCULAR: Negative for chest pain, and palpitations.   GI: Negative for abdominal discomfort, blood in stools or black stools.  No recent change in bowel habits.   GU:  No history of incontinence.   MUSCLOSKELETAL: +history of joint pain or swelling.  No myalgias.   SKIN: Negative for lesions, rash, and itching.   HEMATOLOGY/ONCOLOGY: Negative for prolonged bleeding, bruising easily, and swollen nodes.  No history of cancer.   ENDOCRINE: Negative for cold or heat intolerance, polydipsia or goiter.   PSYCH:  No depression or anxiety symptoms.   NEURO: As Above.   Vital Signs:  BP 120/70 mmHg  Pulse 76  Ht 5' 4.25" (1.632 m)  Wt 192 lb 7 oz (87.289 kg)  BMI 32.77 kg/m2  SpO2 92%   General Medical Exam:   General:  Well appearing, comfortable.   Eyes/ENT: see cranial nerve examination.   Neck: No masses appreciated.  Full range of motion without tenderness.  No carotid bruits. Respiratory:  Clear to auscultation, good air entry bilaterally.   Cardiac:  Regular rate and rhythm, no murmur.   Extremities:  No deformities, mild ankle edema, or skin discoloration.  Skin:  No rashes or lesions.  Neurological Exam: MENTAL STATUS including orientation to time, place, person, recent  and remote memory, attention span and concentration, language, and fund of knowledge is normal.  Speech is not dysarthric.  CRANIAL NERVES: II:  No visual field defects.  Unremarkable fundi.   III-IV-VI: Pupils equal round and reactive to light.  Normal conjugate, extra-ocular eye movements in all directions of gaze.  No nystagmus.  No ptosis.   V:  Normal facial sensation.     VII:  Normal facial symmetry and movements.   VIII:  Normal hearing and vestibular function.   IX-X:  Normal palatal movement.   XI:  Normal shoulder shrug and head rotation.  XII:  Normal tongue strength and range of motion, no deviation or fasciculation.  MOTOR:  No atrophy, fasciculations or abnormal movements.  No pronator drift.  Tone is normal.  There is no pain with palpation over the lower legs and knee area.  Right Upper Extremity:    Left Upper Extremity:    Deltoid  5/5   Deltoid  5/5   Biceps  5/5   Biceps  5/5   Triceps  5/5   Triceps  5/5   Wrist extensors  5/5   Wrist extensors  5/5   Wrist flexors  5/5   Wrist flexors  5/5   Finger extensors  5/5   Finger extensors  5/5   Finger flexors  5/5   Finger flexors  5/5   Dorsal interossei  5/5   Dorsal interossei  5/5   Abductor pollicis  5/5   Abductor pollicis  5/5   Tone (Ashworth scale)  0  Tone (Ashworth scale)  0   Right Lower Extremity:    Left Lower Extremity:    Hip flexors  5/5   Hip flexors  5/5   Hip extensors  5/5   Hip extensors  5/5   Knee flexors  5/5   Knee flexors  5/5   Knee extensors  5/5   Knee extensors  5/5   Dorsiflexors  5/5   Dorsiflexors  5/5   Plantarflexors  5/5   Plantarflexors  5/5   Toe extensors  5/5   Toe extensors  5/5   Toe flexors  5/5   Toe flexors  5/5   Tone (Ashworth scale)  0  Tone (Ashworth scale)  0   MSRs:  Right                                                                 Left brachioradialis 2+  brachioradialis 2+  biceps 2+  biceps 2+  triceps 2+  triceps 2+  patellar 2+  patellar 2+    ankle jerk 1+  ankle jerk 1+  Hoffman no  Hoffman no  plantar response down  plantar response down   SENSORY:  Normal and symmetric perception of light touch, pinprick, vibration, and proprioception.  Romberg's sign absent.   COORDINATION/GAIT: Normal finger-to- nose-finger and heel-to-shin.  Intact rapid alternating movements bilaterally.  Able to rise from a chair without using arms.  Gait narrow based and stable. Tandem and stressed gait intact.    IMPRESSION: Ms. Valent is a 70 year-old female referred for evaluation of right anterior lower leg and lateral knee pain.  Her neurological exam is does not disclose any abnormalities except for mildly reduced ankle jerks which may be age-related or due to neuropathy.  However, the nature of symptoms is very atypical for diabetic neuropathy or radiculopathy.  I will perform NCS/EMG of the legs to better characterize the nature of her symptoms and although she may have some evidence of diabetic neuropathy, her her pain cannot be fully explained by neuropathy.  She may need arterial vascular studies going forward.  Further recommendations will be made based on the results of the testing.   The duration of this appointment visit was 35 minutes of face-to-face time with the Webster.  Greater than 50% of this time  was spent in counseling, explanation of diagnosis, planning of further management, and coordination of care.   Thank Webster for allowing me to participate in Webster's care.  If I can answer any additional questions, I would be pleased to do so.    Sincerely,    Donika K. Posey Pronto, DO

## 2015-06-02 ENCOUNTER — Encounter: Payer: Self-pay | Admitting: Gastroenterology

## 2015-06-02 ENCOUNTER — Ambulatory Visit (INDEPENDENT_AMBULATORY_CARE_PROVIDER_SITE_OTHER): Payer: Medicare Other | Admitting: Gastroenterology

## 2015-06-02 ENCOUNTER — Other Ambulatory Visit (INDEPENDENT_AMBULATORY_CARE_PROVIDER_SITE_OTHER): Payer: Medicare Other

## 2015-06-02 VITALS — BP 130/80 | HR 80 | Ht 64.35 in | Wt 193.1 lb

## 2015-06-02 DIAGNOSIS — M79604 Pain in right leg: Secondary | ICD-10-CM

## 2015-06-02 DIAGNOSIS — R740 Nonspecific elevation of levels of transaminase and lactic acid dehydrogenase [LDH]: Secondary | ICD-10-CM | POA: Diagnosis not present

## 2015-06-02 DIAGNOSIS — K219 Gastro-esophageal reflux disease without esophagitis: Secondary | ICD-10-CM

## 2015-06-02 DIAGNOSIS — R7401 Elevation of levels of liver transaminase levels: Secondary | ICD-10-CM

## 2015-06-02 LAB — HEPATIC FUNCTION PANEL
ALT: 66 U/L — ABNORMAL HIGH (ref 0–35)
AST: 51 U/L — ABNORMAL HIGH (ref 0–37)
Albumin: 4.1 g/dL (ref 3.5–5.2)
Alkaline Phosphatase: 68 U/L (ref 39–117)
Bilirubin, Direct: 0.1 mg/dL (ref 0.0–0.3)
Total Bilirubin: 0.7 mg/dL (ref 0.2–1.2)
Total Protein: 7.3 g/dL (ref 6.0–8.3)

## 2015-06-02 LAB — FERRITIN: Ferritin: 30.3 ng/mL (ref 10.0–291.0)

## 2015-06-02 LAB — IBC PANEL
Iron: 129 ug/dL (ref 42–145)
Saturation Ratios: 30.6 % (ref 20.0–50.0)
Transferrin: 301 mg/dL (ref 212.0–360.0)

## 2015-06-02 NOTE — Progress Notes (Signed)
HPI :  70 y/o female here for follow up for GERD, former Dr. Deatra Ina patient, and new to me. She has a medical history as outlined below.   Patient reports she was told to take Calcium and reports she thinks it makes her reflux worse. She had a Nissen 17 years ago and has been doing okay in general over time in regards to her reflux, and over this time has generally not needed any PPI. She reported more recently she was on prilosec for 2 weeks for pyrosis and it took her symptoms away. She is wondering if her fundoplication remains intact. She is concerned about the possibility of Barrett's. Her endoscopic history is as outlined below. After she stopped her initial course of prilosec, her symptoms recurred. She then took it another few weeks with resolution of symptoms, and she has again stopped it again. She is currently trying to take prilosec only PRN, and it generally works well for her. She thinks calcium triggers her symptoms and she needs to take calcium. She denies a history of osteoporosis. She denies any dysphagia or odynophagia.  No vomiting. No postprandial abdominal pains. She is adopted, no known family history. No tobacco use.   She otherwise asks about her history of elevated liver enzymes. On review of labs historically she has had intermittent mild ALT elevation. She denies any FH of liver disease. No history of jaundice.  EGD 2002 - suspected segment of BE, no BE on biopsies EGD 2006 - esophagitis and gastritis, no evidence of BE Colonoscopy 5/05 - diverticulosis Colonoscopy 4/15 - 4 small adenomas   Past Medical History  Diagnosis Date  . Diabetes mellitus   . GERD (gastroesophageal reflux disease)   . Grave's disease   . Hyperlipidemia   . Osteopenia   . Hypertension   . Carotid artery stenosis     Mild  . Thalassemia minor   . Arthritis   . Colon polyps   . Diverticulosis   . IBS (irritable bowel syndrome)   . Diverticulitis      Past Surgical History    Procedure Laterality Date  . Hysteroscopy      fibroids  . Laparoscopy      fibroids  . Cholecystectomy    . Hiatal hernia repair     Family History  Problem Relation Age of Onset  . Adopted: Yes  . Colon cancer Neg Hx   . Pancreatic cancer Neg Hx   . Rectal cancer Neg Hx   . Stomach cancer Neg Hx   . Healthy Son     x 2  . Headache      Cluster headaches   Social History  Substance Use Topics  . Smoking status: Never Smoker   . Smokeless tobacco: Never Used  . Alcohol Use: No   Current Outpatient Prescriptions  Medication Sig Dispense Refill  . aspirin 81 MG tablet Take 81 mg by mouth daily.      . bromocriptine (PARLODEL) 2.5 MG tablet 1/4 tab daily 8 tablet 11  . escitalopram (LEXAPRO) 20 MG tablet take 1 tablet by mouth once daily 90 tablet 1  . glucose blood (ACCU-CHEK COMFORT CURVE) test strip Use to check blood sugar 1 time per day. 100 each 2  . hydrochlorothiazide (HYDRODIURIL) 12.5 MG tablet Take 1 tablet (12.5 mg total) by mouth daily. 30 tablet 3  . Insulin Infusion Pump (ACCU-CHEK COMBO) KIT Use to check blood 1 time per day 1 kit 2  . metFORMIN (GLUCOPHAGE-XR)  500 MG 24 hr tablet Take 1 tablet (500 mg total) by mouth 2 (two) times daily. 120 tablet 11  . metoprolol succinate (TOPROL-XL) 100 MG 24 hr tablet take 1 tablet by mouth once daily WITH OR IMMEDIATELY FOLLOWING A MEAL 90 tablet 1  . Multiple Vitamin (MULITIVITAMIN WITH MINERALS) TABS Take 1 tablet by mouth daily.    . nortriptyline (PAMELOR) 25 MG capsule take 1 capsule by mouth at bedtime 90 capsule 0  . repaglinide (PRANDIN) 0.5 MG tablet Take 1 tablet (0.5 mg total) by mouth 3 (three) times daily before meals. 90 tablet 11  . SYNTHROID 150 MCG tablet Take 1 tablet (150 mcg total) by mouth daily. 90 tablet 3  . valsartan (DIOVAN) 160 MG tablet take 1 tablet by mouth once daily 90 tablet 2  . VITAMIN D, CHOLECALCIFEROL, PO Take 2,000 Units by mouth daily.    . VITAMIN D, ERGOCALCIFEROL, PO Take 1  tablet by mouth daily.     No current facility-administered medications for this visit.   Allergies  Allergen Reactions  . Statins Other (See Comments)    Muscle aches     Review of Systems: All systems reviewed and negative except where noted in HPI.   Lab Results  Component Value Date   CREATININE 0.7 02/24/2014   BUN 11 02/24/2014   NA 135 02/24/2014   K 4.0 02/24/2014   CL 102 02/24/2014   CO2 25 02/24/2014   Lab Results  Component Value Date   ALT 49* 04/04/2015   AST 39* 04/04/2015   ALKPHOS 66 04/04/2015   BILITOT 0.7 04/04/2015     Physical Exam: BP 130/80 mmHg  Pulse 80  Ht 5' 4.35" (1.634 m)  Wt 193 lb 2 oz (87.601 kg)  BMI 32.81 kg/m2 Constitutional: Pleasant,well-developed, female in no acute distress. HEENT: Normocephalic and atraumatic. Conjunctivae are normal. No scleral icterus. Neck supple.  Cardiovascular: Normal rate, regular rhythm.  Pulmonary/chest: Effort normal and breath sounds normal. No wheezing, rales or rhonchi. Abdominal: Soft, nondistended, nontender. Bowel sounds active throughout. There are no masses palpable. No hepatomegaly. Extremities: no edema Lymphadenopathy: No cervical adenopathy noted. Neurological: Alert and oriented to person place and time. Skin: Skin is warm and dry. No rashes noted. Psychiatric: Normal mood and affect. Behavior is normal.   ASSESSMENT AND PLAN: 69 y/o female with history of longstanding GERD s/p Nissen 17 years ago, now with some recurrence of GERD. I counseled her that it is not uncommon to have some laxity of the Nissen more than 10 year after her surgery, and may patient's after this time frame are back on antacids to some extent for reflux symptoms. In regards to her question about Barrett's, she previously had a short segment per Dr. Kaplan concerning for Barrett's on EGD which was biopsied and no Barrett's was seen in 2002. She then had another EGD in 2006 by another provider who did not report  any Barrett's although some esophagitis was noted on this exam. She has no tobacco use or FH of esophageal cancer. Given no evidence of BE on 2 remote EGDs, I think the likelihood of her having significant Barrett's is likely low at this time and reassured her. However, for piece of mind if she wanted to have an EGD to evaluate for this, I offered it to her. She wishes to think about it for now. Moving forward I counseled her on the risks of PPI use. The risks of long term PPIs with current data include increased   risk for chronic kidney disease, increased risk of fracture, increased risk of C diff, increased risk of pneumonia (short term usage), potentially increased risk of dementia, potentially increased risk of B12 / calcium deficiency, and rare risk of hypomagnesemia. Recent studies have also shown an association with increased risk of cardiovascular outcomes including stroke. These studies have showed an association between PPIs and several of these outcomes but no evidence of causality. The patient was counseled to use the lowest daily use of PPI needed to control symptoms, or to use it PRN and not routinely if she does not have symptoms. Renal function has been historically normal and would recommend checking this yearly while on PPI therapy. She can follow up if she wishes to have an endoscopy.   Elevated LAES - mild intermittent ALT elevation over the years, noted on labs during chart review. Recommend given this issue has persisted to send labs for chronic liver diseases and obtain RUQ US to further evaluate and assess for fatty liver, which may be the likely etiology. Further recommendations pending this result.   Steven [name], MD [facility] Pager [phone]   CC: Dr. Sean Ellison   

## 2015-06-02 NOTE — Patient Instructions (Signed)
Your physician has requested that you go to the basement for lab work before leaving today.  You have been scheduled for an abdominal ultrasound at [facility] (1st floor of hospital) on 06/09/2015 at 8:30am. Please arrive 15 minutes prior to your appointment for registration. Make certain not to have anything to eat or drink 6 hours prior to your appointment. Should you need to reschedule your appointment, please contact radiology at [phone]. This test typically takes about 30 minutes to perform.

## 2015-06-03 LAB — HEPATITIS B SURFACE ANTIGEN: Hepatitis B Surface Ag: NEGATIVE

## 2015-06-03 LAB — ANTI-SMOOTH MUSCLE ANTIBODY, IGG: Smooth Muscle Ab: <20 U (ref <20)

## 2015-06-03 LAB — HEPATITIS C ANTIBODY: HCV Ab: NEGATIVE

## 2015-06-03 LAB — HEPATITIS A ANTIBODY, TOTAL: Hep A Total Ab: NONREACTIVE

## 2015-06-03 LAB — IGG: IgG (Immunoglobin G), Serum: 1260 mg/dL (ref 690–1700)

## 2015-06-03 LAB — ANA: Anti Nuclear Antibody(ANA): NEGATIVE

## 2015-06-03 LAB — HEPATITIS B SURFACE ANTIBODY,QUALITATIVE: Hep B S Ab: NEGATIVE

## 2015-06-06 ENCOUNTER — Telehealth: Payer: Self-pay

## 2015-06-06 LAB — CERULOPLASMIN: Ceruloplasmin: 23 mg/dL (ref 18–53)

## 2015-06-06 LAB — ALPHA-1-ANTITRYPSIN: A-1 Antitrypsin, Ser: 121 mg/dL (ref 83–199)

## 2015-06-06 NOTE — Telephone Encounter (Signed)
Pt called. Changed ultrasound appointment to 06/13/2015 at 11:00am at [facility] radiology. Pt understands to be NPO 6 hours prior.

## 2015-06-08 ENCOUNTER — Other Ambulatory Visit: Payer: Self-pay | Admitting: *Deleted

## 2015-06-09 ENCOUNTER — Ambulatory Visit (HOSPITAL_COMMUNITY): Payer: PRIVATE HEALTH INSURANCE

## 2015-06-09 ENCOUNTER — Ambulatory Visit (INDEPENDENT_AMBULATORY_CARE_PROVIDER_SITE_OTHER): Payer: Medicare Other | Admitting: Endocrinology

## 2015-06-09 ENCOUNTER — Encounter: Payer: Self-pay | Admitting: Endocrinology

## 2015-06-09 VITALS — BP 134/80 | HR 83 | Temp 98.3°F | Ht 64.5 in | Wt 193.0 lb

## 2015-06-09 DIAGNOSIS — E039 Hypothyroidism, unspecified: Secondary | ICD-10-CM

## 2015-06-09 DIAGNOSIS — E119 Type 2 diabetes mellitus without complications: Secondary | ICD-10-CM | POA: Diagnosis not present

## 2015-06-09 LAB — POCT GLYCOSYLATED HEMOGLOBIN (HGB A1C): Hemoglobin A1C: 7

## 2015-06-09 LAB — TSH: TSH: 1.54 u[IU]/mL (ref 0.35–4.50)

## 2015-06-09 MED ORDER — SYNTHROID 150 MCG PO TABS: 150.0000 ug | ORAL_TABLET | Freq: Every day | ORAL | Status: DC

## 2015-06-09 MED ORDER — BROMOCRIPTINE MESYLATE 2.5 MG PO TABS: ORAL_TABLET | ORAL | Status: DC

## 2015-06-09 MED ORDER — DULAGLUTIDE 0.75 MG/0.5ML ~~LOC~~ SOAJ: 0.7500 mg | SUBCUTANEOUS | Status: DC

## 2015-06-09 NOTE — Progress Notes (Signed)
Subjective:    Patient ID: Breanna Webster, female    DOB: 1946/04/17, 70 y.o.   MRN: [id number]  HPI Pt returns for f/u of diabetes mellitus: DM type: 2.  Dx'ed: 2003.  Complications: none.   Therapy: 3 oral meds.   GDM: never.   DKA: never.  Severe hypoglycemia: never.  Pancreatitis: never.  Other: she has never been on insulin; metformin dosage has been limited by diarrhea; pioglitizone is precluded by edema; pt requests generic meds only Interval history: She takes meds as rx'ed.  She has fatigue and weight gain.   Past Medical History  Diagnosis Date  . Diabetes mellitus   . GERD (gastroesophageal reflux disease)   . Grave's disease   . Hyperlipidemia   . Osteopenia   . Hypertension   . Carotid artery stenosis     Mild  . Thalassemia minor   . Arthritis   . Colon polyps   . Diverticulosis   . IBS (irritable bowel syndrome)   . Diverticulitis     Past Surgical History  Procedure Laterality Date  . Hysteroscopy      fibroids  . Laparoscopy      fibroids  . Cholecystectomy    . Hiatal hernia repair      Social History   Social History  . Marital Status: Married    Spouse Name: N/A  . Number of Children: 2  . Years of Education: N/A   Occupational History  . fundraising    Social History Main Topics  . Smoking status: Never Smoker   . Smokeless tobacco: Never Used  . Alcohol Use: No  . Drug Use: No  . Sexual Activity: Not on file   Other Topics Concern  . Not on file   Social History Narrative   Lives with husband in a one story home.     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   Education: college.    Current Outpatient Prescriptions on File Prior to Visit  Medication Sig Dispense Refill  . aspirin 81 MG tablet Take 81 mg by mouth daily.      Marland Kitchen escitalopram (LEXAPRO) 20 MG tablet take 1 tablet by mouth once daily 90 tablet 1  . glucose blood (ACCU-CHEK COMFORT CURVE)  test strip Use to check blood sugar 1 time per day. 100 each 2  . hydrochlorothiazide (HYDRODIURIL) 12.5 MG tablet Take 1 tablet (12.5 mg total) by mouth daily. 30 tablet 3  . Insulin Infusion Pump (ACCU-CHEK COMBO) KIT Use to check blood 1 time per day 1 kit 2  . metFORMIN (GLUCOPHAGE-XR) 500 MG 24 hr tablet Take 1 tablet (500 mg total) by mouth 2 (two) times daily. 120 tablet 11  . metoprolol succinate (TOPROL-XL) 100 MG 24 hr tablet take 1 tablet by mouth once daily WITH OR IMMEDIATELY FOLLOWING A MEAL 90 tablet 1  . Multiple Vitamin (MULITIVITAMIN WITH MINERALS) TABS Take 1 tablet by mouth daily.    . nortriptyline (PAMELOR) 25 MG capsule take 1 capsule by mouth at bedtime 90 capsule 0  . repaglinide (PRANDIN) 0.5 MG tablet Take 1 tablet (0.5 mg total) by mouth 3 (three) times daily before meals. 90 tablet 11  . valsartan (DIOVAN) 160 MG tablet take 1 tablet by mouth once daily 90 tablet 2  . VITAMIN D, CHOLECALCIFEROL, PO Take 2,000 Units by mouth daily.    Marland Kitchen VITAMIN D, ERGOCALCIFEROL, PO Take 1 tablet by  mouth daily.     No current facility-administered medications on file prior to visit.    Allergies  Allergen Reactions  . Statins Other (See Comments)    Muscle aches    Family History  Problem Relation Age of Onset  . Adopted: Yes  . Colon cancer Neg Hx   . Pancreatic cancer Neg Hx   . Rectal cancer Neg Hx   . Stomach cancer Neg Hx   . Healthy Son     x 2  . Headache      Cluster headaches    BP 134/80 mmHg  Pulse 83  Temp(Src) 98.3 F (36.8 C) (Oral)  Ht 5' 4.5" (1.638 m)  Wt 193 lb (87.544 kg)  BMI 32.63 kg/m2  SpO2 93%  Review of Systems No edema.     Objective:   Physical Exam VITAL SIGNS:  See vs page GENERAL: no distress Pulses: dorsalis pedis intact bilat.   MSK: no deformity of the feet CV: no leg edema Skin:  no ulcer on the feet.  normal color and temp on the feet. Neuro: sensation is intact to touch on the feet.    A1c=7.0% Lab Results    Component Value Date   TSH 1.54 06/09/2015      Assessment & Plan:  DM: glycemic control is worse Hypothyroidism: well-replaced  Patient is advised the following: Patient Instructions  blood tests are requested for you today.  We'll let you know about the results.  Please come back for a follow-up appointment in 2 months.  i have sent a prescription to your pharmacy, to add trulicity.  If you don't have any nausea on the , call so we can increase it. You may lose weight on this, and this may help your liver.   Please continue the same other diabetes medications.   check your blood sugar once a day.  vary the time of day when you check, between before the 3 meals, and at bedtime.  also check if you have symptoms of your blood sugar being too high or too low.  please keep a record of the readings and bring it to your next appointment here.  You can write it on any piece of paper.  please call us sooner if your blood sugar goes below 70, or if you have a lot of readings over 200.    addendum: same synthroid

## 2015-06-09 NOTE — Patient Instructions (Addendum)
blood tests are requested for you today.  We'll let you know about the results.  Please come back for a follow-up appointment in 2 months.  i have sent a prescription to your pharmacy, to add trulicity.  If you don't have any nausea on the , call so we can increase it. You may lose weight on this, and this may help your liver.   Please continue the same other diabetes medications.   check your blood sugar once a day.  vary the time of day when you check, between before the 3 meals, and at bedtime.  also check if you have symptoms of your blood sugar being too high or too low.  please keep a record of the readings and bring it to your next appointment here.  You can write it on any piece of paper.  please call us sooner if your blood sugar goes below 70, or if you have a lot of readings over 200.

## 2015-06-13 ENCOUNTER — Ambulatory Visit (HOSPITAL_COMMUNITY)
Admission: RE | Admit: 2015-06-13 | Discharge: 2015-06-13 | Disposition: A | Discharge status: Home / Self Care | Payer: Medicare Other | Source: Ambulatory Visit | Attending: Gastroenterology | Admitting: Gastroenterology

## 2015-06-13 DIAGNOSIS — N281 Cyst of kidney, acquired: Secondary | ICD-10-CM | POA: Insufficient documentation

## 2015-06-13 DIAGNOSIS — E119 Type 2 diabetes mellitus without complications: Secondary | ICD-10-CM | POA: Insufficient documentation

## 2015-06-13 DIAGNOSIS — K760 Fatty (change of) liver, not elsewhere classified: Secondary | ICD-10-CM | POA: Diagnosis not present

## 2015-06-13 DIAGNOSIS — Z9049 Acquired absence of other specified parts of digestive tract: Secondary | ICD-10-CM | POA: Diagnosis not present

## 2015-06-13 DIAGNOSIS — K219 Gastro-esophageal reflux disease without esophagitis: Secondary | ICD-10-CM | POA: Diagnosis not present

## 2015-06-13 DIAGNOSIS — R740 Nonspecific elevation of levels of transaminase and lactic acid dehydrogenase [LDH]: Secondary | ICD-10-CM | POA: Insufficient documentation

## 2015-06-14 ENCOUNTER — Ambulatory Visit (INDEPENDENT_AMBULATORY_CARE_PROVIDER_SITE_OTHER): Payer: Medicare Other | Admitting: Neurology

## 2015-06-14 ENCOUNTER — Other Ambulatory Visit: Payer: Self-pay | Admitting: *Deleted

## 2015-06-14 ENCOUNTER — Telehealth: Payer: Self-pay | Admitting: Neurology

## 2015-06-14 DIAGNOSIS — M79604 Pain in right leg: Secondary | ICD-10-CM | POA: Diagnosis not present

## 2015-06-14 DIAGNOSIS — M25561 Pain in right knee: Secondary | ICD-10-CM

## 2015-06-14 NOTE — Telephone Encounter (Signed)
Results of NCS/EMG discussed with the patient which does not show any evidence of lumbosacral radiculopathy or peroneal neuropathy. Given that her symptoms do not follow a nerve distribution and are worse with weightbearing and walking, she may need to be evaluated for other possibilities such as in tendonitis.  Referral will be placed to Sports Medicine.   Breanna K. Posey Pronto, DO

## 2015-06-14 NOTE — Procedures (Signed)
[facility]  [place], [place]  [place], [place] [address] Tel: [phone] Fax:  [phone] Test Date:  06/14/2015  Patient: Taisley Mordan DOB: [birth year] Physician: Narda Amber  Sex: Female Height: 5' 5"  Ref Phys: Narda Amber  ID#: [id number] Temp: 33.1C Technician: Jerilynn Mages. Dean   Patient Complaints: This is a 70 year-old female referred for evaluation of anterior right leg pain, which is exacerbated by walking and weightbearing.  NCV & EMG Findings: Extensive electrodiagnostic testing of the right lower extremity shows: 1. Right sural and superficial peroneal sensory responses are within normal limits. 2. Right peroneal and tibial motor responses are within normal limits. 3. There is no evidence of active or chronic motor axon loss changes affecting any of the tested muscles. Motor unit configuration and recruitment pattern is within normal limits.  Impression: This is a normal study of the right lower extremity. In particular, there is no evidence of a generalized sensorimotor polyneuropathy, lumbosacral radiculopathy, or peroneal mononeuropathy.   _____________________________ Narda Amber, D.O.    Nerve Conduction Studies Anti Sensory Summary Table   Stim Site NR Peak (ms) Norm Peak (ms) P-T Amp (V) Norm P-T Amp  Right Sup Peroneal Anti Sensory (Ant Lat Mall)  33.1C  12 cm    2.7 <4.6 6.4 >3  Right Sural Anti Sensory (Lat Mall)  33.1C  Calf    3.4 <4.6 9.8 >3   Motor Summary Table   Stim Site NR Onset (ms) Norm Onset (ms) O-P Amp (mV) Norm O-P Amp Site1 Site2 Delta-0 (ms) Dist (cm) Vel (m/s) Norm Vel (m/s)  Right Peroneal Motor (Ext Dig Brev)  33.1C  Ankle    3.7 <6.0 5.2 >2.5 B Fib Ankle 6.3 28.0 45 >40  B Fib    10.0  5.1  Poplt B Fib 2.0 8.0 43 >40  Poplt    12.0  4.9         Right Tibial Motor (Abd Hall Brev)  33.1C  Ankle    3.6 <6.0 4.3 >4 Knee Ankle 8.5 38.0 45 >40  Knee    12.1  3.7          EMG   Side Muscle Ins Act Fibs Psw  Fasc Number Recrt Dur Dur. Amp Amp. Poly Poly. Comment  Right AntTibialis Nml Nml Nml Nml Nml Nml Nml Nml Nml Nml Nml Nml N/A  Right Gastroc Nml Nml Nml Nml Nml Nml Nml Nml Nml Nml Nml Nml N/A  Right RectFemoris Nml Nml Nml Nml Nml Nml Nml Nml Nml Nml Nml Nml N/A  Right BicepsFemS Nml Nml Nml Nml Nml Nml Nml Nml Nml Nml Nml Nml N/A  Right GluteusMed Nml Nml Nml Nml Nml Nml Nml Nml Nml Nml Nml Nml N/A  Right Flex Dig Long Nml Nml Nml Nml Nml Nml Nml Nml Nml Nml Nml Nml N/A      Waveforms:

## 2015-06-15 ENCOUNTER — Ambulatory Visit (INDEPENDENT_AMBULATORY_CARE_PROVIDER_SITE_OTHER): Payer: Medicare Other | Admitting: Gastroenterology

## 2015-06-15 DIAGNOSIS — R7989 Other specified abnormal findings of blood chemistry: Secondary | ICD-10-CM

## 2015-06-15 DIAGNOSIS — R945 Abnormal results of liver function studies: Principal | ICD-10-CM

## 2015-06-15 DIAGNOSIS — Z23 Encounter for immunization: Secondary | ICD-10-CM | POA: Diagnosis not present

## 2015-06-27 ENCOUNTER — Telehealth: Payer: Self-pay | Admitting: Internal Medicine

## 2015-06-27 NOTE — Telephone Encounter (Signed)
Pt would like to know should she get hctz 12.5 mg refill.

## 2015-06-28 ENCOUNTER — Telehealth: Payer: Self-pay | Admitting: *Deleted

## 2015-06-28 DIAGNOSIS — E119 Type 2 diabetes mellitus without complications: Secondary | ICD-10-CM

## 2015-06-28 MED ORDER — HYDROCHLOROTHIAZIDE 12.5 MG PO TABS
12.5000 mg | ORAL_TABLET | Freq: Every day | ORAL | Status: DC
Start: 2015-06-28 — End: 2015-12-28

## 2015-06-28 NOTE — Telephone Encounter (Signed)
Left message on machine for patient to return our call.  Patient needs to schedule a lab appointment per Dr Shawna Orleans.  Labs ordered

## 2015-06-28 NOTE — Telephone Encounter (Signed)
Rx sent 

## 2015-06-28 NOTE — Telephone Encounter (Signed)
[place] for 3 month supply with one refill.

## 2015-06-29 ENCOUNTER — Ambulatory Visit (INDEPENDENT_AMBULATORY_CARE_PROVIDER_SITE_OTHER): Payer: Medicare Other | Admitting: Family Medicine

## 2015-06-29 ENCOUNTER — Other Ambulatory Visit (INDEPENDENT_AMBULATORY_CARE_PROVIDER_SITE_OTHER): Payer: Medicare Other

## 2015-06-29 ENCOUNTER — Encounter: Payer: Self-pay | Admitting: Family Medicine

## 2015-06-29 VITALS — BP 116/70 | HR 76 | Ht 64.5 in | Wt 192.0 lb

## 2015-06-29 DIAGNOSIS — M79604 Pain in right leg: Secondary | ICD-10-CM

## 2015-06-29 DIAGNOSIS — M899 Disorder of bone, unspecified: Secondary | ICD-10-CM

## 2015-06-29 DIAGNOSIS — M79661 Pain in right lower leg: Secondary | ICD-10-CM | POA: Diagnosis not present

## 2015-06-29 DIAGNOSIS — M949 Disorder of cartilage, unspecified: Secondary | ICD-10-CM

## 2015-06-29 DIAGNOSIS — E559 Vitamin D deficiency, unspecified: Secondary | ICD-10-CM

## 2015-06-29 MED ORDER — VITAMIN D (ERGOCALCIFEROL) 1.25 MG (50000 UNIT) PO CAPS: 50000.0000 [IU] | ORAL_CAPSULE | ORAL | Status: DC

## 2015-06-29 NOTE — Assessment & Plan Note (Signed)
Patient does have what seems to be localized periosteal  Reaction  I do not see any true cortical defect. Differential includes a muscular spasm as well as potentially vascular spasm  Discussed with patient about different treatment options. Patient has elected to try conservative therapy. Does have history of vitamin D deficiency. We have a treat her for this. We also discussed compression sleeves of the area to help with vascularization as well as decreasing any type of swelling that occurs. This could also be radicular symptoms from patient's knee. With a need to consider further workup for the knee and likely underlying arthritis the varus deformity. We discussed topical anti-inflammatories and patient was given a trial. Patient will come back and see me again in 3-4 weeks for further evaluation and treatment.

## 2015-06-29 NOTE — Assessment & Plan Note (Signed)
Prescription given.

## 2015-06-29 NOTE — Patient Instructions (Addendum)
Good to see you.  Ice 20 minutes  daily. Usually after activity  Vitamin D weekly and sent into pharmacy  pennsaid pinkie amount topically 2 times daily as needed.  Turmeric 543m daily to decrease inflammation Vitamin C 5077mdaily with breakfast to help indirectly increase absorption of iron .  Good shoes with rigid bottom.  KeJalene MulletMerrell or New balance greater then 700 Wear compression socks with activity  See em again in 3-4 weeks to make sure the inflammation of the bone resolves.

## 2015-06-29 NOTE — Assessment & Plan Note (Signed)
Patient will start taking vitamin D on a regular basis.
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
## 2015-06-29 NOTE — Progress Notes (Signed)
Pre visit review using our clinic review tool, if applicable. No additional management support is needed unless otherwise documented below in the visit note. 

## 2015-07-15 ENCOUNTER — Ambulatory Visit (INDEPENDENT_AMBULATORY_CARE_PROVIDER_SITE_OTHER): Payer: Medicare Other | Admitting: Gastroenterology

## 2015-07-15 DIAGNOSIS — R748 Abnormal levels of other serum enzymes: Secondary | ICD-10-CM

## 2015-07-15 DIAGNOSIS — Z23 Encounter for immunization: Secondary | ICD-10-CM | POA: Diagnosis not present

## 2015-07-20 ENCOUNTER — Encounter: Payer: Self-pay | Admitting: Family Medicine

## 2015-07-20 ENCOUNTER — Encounter: Payer: Self-pay | Admitting: *Deleted

## 2015-07-20 ENCOUNTER — Ambulatory Visit (INDEPENDENT_AMBULATORY_CARE_PROVIDER_SITE_OTHER): Payer: Medicare Other | Admitting: Family Medicine

## 2015-07-20 ENCOUNTER — Telehealth: Payer: Self-pay | Admitting: Family Medicine

## 2015-07-20 VITALS — BP 110/62 | HR 77 | Ht 64.5 in | Wt 188.0 lb

## 2015-07-20 DIAGNOSIS — M79661 Pain in right lower leg: Secondary | ICD-10-CM

## 2015-07-20 NOTE — Telephone Encounter (Signed)
No  I've never heard of a worm in an egg.  Even if this were so unlikely to be harmful. Would not follow-up unless any concerning symptoms

## 2015-07-20 NOTE — Telephone Encounter (Signed)
[facility] Client [facility] Patient Name: Breanna Webster DOB: [birth year] Initial Comment Caller states she ate an egg that may have had a slug or worm in it. Would like to know if she needs an antibiotic. Nurse Assessment Nurse: Markus Daft, RN, Sherre Poot Date/Time (Eastern Time): 07/20/2015 12:37:36 PM Confirm and document reason for call. If symptomatic, describe symptoms. You must click the next button to save text entered. ---Caller states she ate part of an egg this AM that may have had a slug or worm in it. She had heated up the egg in microwave. It was a fresh egg. No vomiting/diarrhea. No S/S. -- Would like to know if she needs an antibiotic? Has the patient traveled out of the country within the last 30 days? ---Not Applicable Does the patient have any new or worsening symptoms? ---Yes Will a triage be completed? ---Yes Related visit to physician within the last 2 weeks? ---No Does the PT have any chronic conditions? (i.e. diabetes, asthma, etc.) ---Yes List chronic conditions. ---NIDDM Is this a behavioral health or substance abuse call? ---No Guidelines Guideline Title Affirmed Question Affirmed Notes Swallowed Foreign Body Harmless swallowed FB and no symptoms (all triage questions negative) Poisoning HARMLESS SUBSTANCE (nonpoisonous) ingestion (all triage questions negative) Final Disposition User [place], RN, Windy Disagree/Comply: Comply

## 2015-07-20 NOTE — Telephone Encounter (Signed)
mychart message sent to patient

## 2015-07-20 NOTE — Progress Notes (Signed)
[facility] [place], [place] [address] Phone: [phone] Subjective:     CC: Right leg pain Follow-up  [id number] Breanna Webster is a 70 y.o. female coming in with complaint of right leg pain. Please see previous note for more history. We discussed with patient having more of a right tibia pain that seemed to be more in the distal third. Patient's workup included an x-ray of the tibia-fibula that did show proximal tibial ganglion cyst but otherwise unremarkable. Patient hasn't been seen by neurology and they did not feel that there was any neurologic component within normal nerve conduction study. Patient did have an ultrasound at last visit that did show a nonspecific periosteal hypoechoic changes. Patient was treated as more as a possible stress fracture versus myositis. Patient states that she has been doing the medications. Regularly but not doing the exercises as well as she should. Patient states that he feels that the pain is worsening. Seems to be continuing to be on the anterior aspect of the tibia but can also radiate to the posterior. Starting to affect all activities of daily living. She says ambulation has become more difficult. Patient can even have pain at rest. Feels like a cramping sensation but then a very sharp pain. Feels that this is significantly worse and she is frustrated that she is not getting better with something that has been going on for greater than 4 months at least.     Past Medical History  Diagnosis Date  . Diabetes mellitus   . GERD (gastroesophageal reflux disease)   . Grave's disease   . Hyperlipidemia   . Osteopenia   . Hypertension   . Carotid artery stenosis     Mild  . Thalassemia minor   . Arthritis   . Colon polyps   . Diverticulosis   . IBS (irritable bowel syndrome)   . Diverticulitis    Past Surgical History  Procedure Laterality Date  . Hysteroscopy      fibroids  . Laparoscopy     fibroids  . Cholecystectomy    . Hiatal hernia repair     Social History   Social History  . Marital Status: Married    Spouse Name: N/A  . Number of Children: 2  . Years of Education: N/A   Occupational History  . fundraising    Social History Main Topics  . Smoking status: Never Smoker   . Smokeless tobacco: Never Used  . Alcohol Use: No  . Drug Use: No  . Sexual Activity: Not Asked   Other Topics Concern  . None   Social History Narrative   Lives with husband in a one story home.     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   Education: college.   Allergies  Allergen Reactions  . Statins Other (See Comments)    Muscle aches   Family History  Problem Relation Age of Onset  . Adopted: Yes  . Colon cancer Neg Hx   . Pancreatic cancer Neg Hx   . Rectal cancer Neg Hx   . Stomach cancer Neg Hx   . Healthy Son     x 2  . Headache      Cluster headaches    Past medical history, social, surgical and family history all reviewed in electronic medical record.  No pertanent information unless stated regarding to the chief complaint.  Review of Systems: No headache, visual changes, nausea, vomiting, diarrhea, constipation, dizziness, abdominal pain, skin rash, fevers, chills, night sweats, weight loss, swollen lymph nodes, body aches, joint swelling, muscle aches, chest pain, shortness of breath, mood changes.   Objective Blood pressure 110/62, pulse 77, height 5' 4.5" (1.638 m), weight 188 lb (85.276 kg), SpO2 96 %.  General: No apparent distress alert and oriented x3 mood and affect normal, dressed appropriately.  HEENT: Pupils equal, extraocular movements intact  Respiratory: Patient's speak in full sentences and does not appear short of breath  Cardiovascular: Trace lower extremity edema, non tender, no erythema  Skin: Warm dry intact with no signs of infection or rash on extremities or on axial  skeleton.  Abdomen: Soft nontender  Neuro: Cranial nerves II through XII are intact, neurovascularly intact in all extremities with 2+ DTRs and 2+ pulses.  Lymph: No lymphadenopathy of posterior or anterior cervical chain or axillae bilaterally.  Gait normal with good balance and coordination.  MSK:  Non tender with full range of motion and good stability and symmetric strength and tone of shoulders, elbows, wrist, hip, bilaterally  Knee:Right There is deformity of the knees bilaterally Tender over the anterior tibialis area mostly on the medial tibial side of the distal third of the tibia. Pain to percussion. Patient does have more involuntary guarding than previous exam. Trace effusion also noted of the lower extremity but seems to be bilateral. ROM full in flexion and extension and lower leg rotation. Ligaments with solid consistent endpoints including ACL, PCL, LCL, MCL. Negative Mcmurray's, Apley's, and Thessalonian tests. Mild painful patellar compression. Patellar glide mild crepitus. Patellar and quadriceps tendons unremarkable. Hamstring and quadriceps strength is normal.  Contralateral knee unremarkable  Full range of motion of the ankle and nontender over the medial and lateral malleolus itself.   Impression and Recommendations:     This case required medical decision making of moderate complexity.      Note: This dictation was prepared with Dragon dictation along with smaller phrase technology. Any transcriptional errors that result from this process are unintentional.

## 2015-07-20 NOTE — Patient Instructions (Signed)
Good to see you  Ice is your friend We will get ultrasound to make sure no clot,  We will then get MRi to see what is going on definitively  When I get the results I will write you and tell you what is next.

## 2015-07-20 NOTE — Progress Notes (Signed)
Pre visit review using our clinic review tool, if applicable. No additional management support is needed unless otherwise documented below in the visit note. 

## 2015-07-20 NOTE — Telephone Encounter (Signed)
Pt notified.  Will follow up if necessary.

## 2015-07-21 ENCOUNTER — Telehealth: Payer: Self-pay | Admitting: Family Medicine

## 2015-07-21 ENCOUNTER — Ambulatory Visit (HOSPITAL_COMMUNITY)
Admission: RE | Admit: 2015-07-21 | Discharge: 2015-07-21 | Disposition: A | Discharge status: Home / Self Care | Payer: Medicare Other | Source: Ambulatory Visit | Attending: Cardiology | Admitting: Cardiology

## 2015-07-21 DIAGNOSIS — M79661 Pain in right lower leg: Secondary | ICD-10-CM

## 2015-07-21 DIAGNOSIS — K219 Gastro-esophageal reflux disease without esophagitis: Secondary | ICD-10-CM | POA: Diagnosis not present

## 2015-07-21 DIAGNOSIS — E119 Type 2 diabetes mellitus without complications: Secondary | ICD-10-CM | POA: Insufficient documentation

## 2015-07-21 DIAGNOSIS — E785 Hyperlipidemia, unspecified: Secondary | ICD-10-CM | POA: Insufficient documentation

## 2015-07-21 DIAGNOSIS — I10 Essential (primary) hypertension: Secondary | ICD-10-CM | POA: Insufficient documentation

## 2015-07-21 NOTE — Telephone Encounter (Signed)
Please call asap regarding her [name] at [phone]. She is getting ready to leave and wants to know if this will be done today.

## 2015-07-21 NOTE — Assessment & Plan Note (Addendum)
Patient does have a very localized area of tenderness. Patient states that this does not seem to be associated with her knee at all. With taking patient to range of motion she seems to do relatively well. Discussed with patient about other concerns that have not been ruled out at this time. Patient does have a normal conduction tests. At this point I do feel that advance imaging could be warranted. We discussed repeating the tibia-fibula x-rays which I do not think would give [name] more information. Also patient ultrasound was fairly nonspecific. MRI of the lower extremity would be necessary. We discussed would be looking for more of a potential stress fracture versus muscle injury, myositis or any type of bone pathology. Differential also includes of bursitis in we will rule out deep venous thrombosis even though it is a highly low likelihood. Patient was ordered an ultrasound for her venous system. After the MRI and we will discuss further management. In the interim did discuss with patient not giving some possible pain medications or actually putting patient in a Cam Walker. Patient declined both. We will await advanced imaging.  Spent  25 minutes with patient face-to-face and had greater than 50% of counseling including as described above in assessment and plan.

## 2015-07-21 NOTE — Telephone Encounter (Signed)
Pt scheduled today @ 130. Pt is aware.

## 2015-07-27 ENCOUNTER — Other Ambulatory Visit: Payer: Self-pay | Admitting: Family Medicine

## 2015-07-28 ENCOUNTER — Ambulatory Visit
Admission: RE | Admit: 2015-07-28 | Discharge: 2015-07-28 | Disposition: A | Discharge status: Home / Self Care | Payer: Medicare Other | Source: Ambulatory Visit | Attending: Family Medicine | Admitting: Family Medicine

## 2015-07-28 DIAGNOSIS — R600 Localized edema: Secondary | ICD-10-CM | POA: Diagnosis not present

## 2015-07-28 DIAGNOSIS — M79661 Pain in right lower leg: Secondary | ICD-10-CM

## 2015-08-10 ENCOUNTER — Encounter: Payer: Self-pay | Admitting: Endocrinology

## 2015-08-10 ENCOUNTER — Ambulatory Visit (INDEPENDENT_AMBULATORY_CARE_PROVIDER_SITE_OTHER): Payer: Medicare Other | Admitting: Endocrinology

## 2015-08-10 VITALS — BP 120/78 | HR 73 | Temp 98.1°F | Ht 64.5 in | Wt 189.0 lb

## 2015-08-10 DIAGNOSIS — E119 Type 2 diabetes mellitus without complications: Secondary | ICD-10-CM | POA: Diagnosis not present

## 2015-08-10 LAB — POCT GLYCOSYLATED HEMOGLOBIN (HGB A1C): Hemoglobin A1C: 6.8

## 2015-08-10 MED ORDER — DULAGLUTIDE 1.5 MG/0.5ML ~~LOC~~ SOAJ: 1.5000 mg | SUBCUTANEOUS | Status: DC

## 2015-08-10 NOTE — Patient Instructions (Addendum)
Please come back for a follow-up appointment in 2 months.  i have sent a prescription to your pharmacy, to double the trulicity.   You may lose weight on this, and this may help your liver.   Please continue the same other diabetes medications.   check your blood sugar once a day.  vary the time of day when you check, between before the 3 meals, and at bedtime.  also check if you have symptoms of your blood sugar being too high or too low.  please keep a record of the readings and bring it to your next appointment here.  You can write it on any piece of paper.  please call us sooner if your blood sugar goes below 70, or if you have a lot of readings over 200.

## 2015-08-10 NOTE — Progress Notes (Signed)
Subjective:    Patient ID: Breanna Webster, female    DOB: 01-Nov-1945, 70 y.o.   MRN: [id number]  HPI Pt returns for f/u of diabetes mellitus: DM type: 2.  Dx'ed: 2003.  Complications: none.   Therapy: 2 oral meds and trulicity   GDM: never.   DKA: never.  Severe hypoglycemia: never.  Pancreatitis: never.  Other: she has never been on insulin; metformin dosage has been limited by diarrhea; pioglitizone is precluded by edema. Interval history: She takes meds as rx'ed.  pt states she feels well in general.  Denies nausea.   Past Medical History  Diagnosis Date  . Diabetes mellitus   . GERD (gastroesophageal reflux disease)   . Grave's disease   . Hyperlipidemia   . Osteopenia   . Hypertension   . Carotid artery stenosis     Mild  . Thalassemia minor   . Arthritis   . Colon polyps   . Diverticulosis   . IBS (irritable bowel syndrome)   . Diverticulitis     Past Surgical History  Procedure Laterality Date  . Hysteroscopy      fibroids  . Laparoscopy      fibroids  . Cholecystectomy    . Hiatal hernia repair      Social History   Social History  . Marital Status: Married    Spouse Name: N/A  . Number of Children: 2  . Years of Education: N/A   Occupational History  . fundraising    Social History Main Topics  . Smoking status: Never Smoker   . Smokeless tobacco: Never Used  . Alcohol Use: No  . Drug Use: No  . Sexual Activity: Not on file   Other Topics Concern  . Not on file   Social History Narrative   Lives with husband in a one story home.     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   Education: college.    Current Outpatient Prescriptions on File Prior to Visit  Medication Sig Dispense Refill  . Ascorbic Acid (VITAMIN C PO) Take 1 capsule by mouth daily.    Marland Kitchen aspirin 81 MG tablet Take 81 mg by mouth daily.      . bromocriptine (PARLODEL) 2.5 MG tablet 1/4 tab daily 8  tablet 11  . escitalopram (LEXAPRO) 20 MG tablet take 1 tablet by mouth once daily 90 tablet 1  . glucose blood (ACCU-CHEK COMFORT CURVE) test strip Use to check blood sugar 1 time per day. 100 each 2  . hydrochlorothiazide (HYDRODIURIL) 12.5 MG tablet Take 1 tablet (12.5 mg total) by mouth daily. 90 tablet 1  . metoprolol succinate (TOPROL-XL) 100 MG 24 hr tablet take 1 tablet by mouth once daily WITH OR IMMEDIATELY FOLLOWING A MEAL 90 tablet 1  . Multiple Vitamin (MULITIVITAMIN WITH MINERALS) TABS Take 1 tablet by mouth daily.    . nortriptyline (PAMELOR) 25 MG capsule take 1 capsule by mouth at bedtime 90 capsule 0  . repaglinide (PRANDIN) 0.5 MG tablet Take 1 tablet (0.5 mg total) by mouth 3 (three) times daily before meals. 90 tablet 11  . SYNTHROID 150 MCG tablet Take 1 tablet (150 mcg total) by mouth daily. 90 tablet 3  . Turmeric 500 MG TABS Take 1 capsule by mouth daily.    . valsartan (DIOVAN) 160 MG tablet take 1 tablet by mouth once daily 90 tablet 2  . Vitamin D, Ergocalciferol, (DRISDOL)  50000 units CAPS capsule Take 1 capsule (50,000 Units total) by mouth every 7 (seven) days. 12 capsule 0   No current facility-administered medications on file prior to visit.    Allergies  Allergen Reactions  . Statins Other (See Comments)    Muscle aches    Family History  Problem Relation Age of Onset  . Adopted: Yes  . Colon cancer Neg Hx   . Pancreatic cancer Neg Hx   . Rectal cancer Neg Hx   . Stomach cancer Neg Hx   . Healthy Son     x 2  . Headache      Cluster headaches    BP 120/78 mmHg  Pulse 73  Temp(Src) 98.1 F (36.7 C) (Oral)  Ht 5' 4.5" (1.638 m)  Wt 189 lb (85.73 kg)  BMI 31.95 kg/m2  SpO2 95%  Review of Systems She has lost a few lbs.    Objective:   Physical Exam VITAL SIGNS:  See vs page GENERAL: no distress SKIN:  Injection sites at the anterior abdomen are normal.     Lab Results  Component Value Date   HGBA1C 6.8 08/10/2015        Assessment & Plan:  DM: she needs increased rx, if it can be done with a regimen that avoids or minimizes hypoglycemia.   Patient is advised the following: Patient Instructions  Please come back for a follow-up appointment in 2 months.  i have sent a prescription to your pharmacy, to double the trulicity.   You may lose weight on this, and this may help your liver.   Please continue the same other diabetes medications.   check your blood sugar once a day.  vary the time of day when you check, between before the 3 meals, and at bedtime.  also check if you have symptoms of your blood sugar being too high or too low.  please keep a record of the readings and bring it to your next appointment here.  You can write it on any piece of paper.  please call us sooner if your blood sugar goes below 70, or if you have a lot of readings over 200.

## 2015-08-22 ENCOUNTER — Other Ambulatory Visit: Payer: Self-pay | Admitting: *Deleted

## 2015-08-22 MED ORDER — METOPROLOL SUCCINATE ER 100 MG PO TB24: ORAL_TABLET | ORAL | Status: DC

## 2015-09-20 ENCOUNTER — Other Ambulatory Visit: Payer: Self-pay | Admitting: *Deleted

## 2015-09-21 ENCOUNTER — Other Ambulatory Visit: Payer: Self-pay

## 2015-09-21 NOTE — Telephone Encounter (Signed)
Pt is requesting a refill, last filled on 01/27/2015. Had a physical on 03/29/15.

## 2015-09-21 NOTE — Telephone Encounter (Signed)
Refill for 6 months. 

## 2015-09-22 MED ORDER — NORTRIPTYLINE HCL 25 MG PO CAPS: 25.0000 mg | ORAL_CAPSULE | Freq: Every day | ORAL | Status: DC

## 2015-09-22 NOTE — Telephone Encounter (Signed)
sent to pt's pharmacy.

## 2015-09-26 ENCOUNTER — Ambulatory Visit (INDEPENDENT_AMBULATORY_CARE_PROVIDER_SITE_OTHER): Payer: Medicare Other | Admitting: Family Medicine

## 2015-09-26 ENCOUNTER — Encounter: Payer: Self-pay | Admitting: Family Medicine

## 2015-09-26 VITALS — BP 110/64 | HR 67 | Temp 98.4°F | Resp 16 | Ht 64.5 in | Wt 225.2 lb

## 2015-09-26 DIAGNOSIS — R059 Cough, unspecified: Secondary | ICD-10-CM

## 2015-09-26 DIAGNOSIS — R05 Cough: Secondary | ICD-10-CM | POA: Diagnosis not present

## 2015-09-26 DIAGNOSIS — J988 Other specified respiratory disorders: Secondary | ICD-10-CM

## 2015-09-26 DIAGNOSIS — R062 Wheezing: Secondary | ICD-10-CM

## 2015-09-26 MED ORDER — PREDNISONE 20 MG PO TABS: 40.0000 mg | ORAL_TABLET | Freq: Every day | ORAL | Status: AC

## 2015-09-26 MED ORDER — DOXYCYCLINE MONOHYDRATE 100 MG PO CAPS: 100.0000 mg | ORAL_CAPSULE | Freq: Two times a day (BID) | ORAL | Status: AC

## 2015-09-26 MED ORDER — HYDROCODONE-HOMATROPINE 5-1.5 MG/5ML PO SYRP: 5.0000 mL | ORAL_SOLUTION | Freq: Every evening | ORAL | Status: DC | PRN

## 2015-09-26 MED ORDER — IPRATROPIUM-ALBUTEROL 0.5-2.5 (3) MG/3ML IN SOLN: 3.0000 mL | Freq: Once | RESPIRATORY_TRACT | Status: DC

## 2015-09-26 MED ORDER — HYDROCODONE-HOMATROPINE 5-1.5 MG/5ML PO SYRP: 5.0000 mL | ORAL_SOLUTION | Freq: Every evening | ORAL | Status: AC | PRN

## 2015-09-26 MED ORDER — ALBUTEROL SULFATE HFA 108 (90 BASE) MCG/ACT IN AERS: 2.0000 | INHALATION_SPRAY | Freq: Four times a day (QID) | RESPIRATORY_TRACT | Status: DC | PRN

## 2015-09-26 NOTE — Progress Notes (Signed)
Pre visit review using our clinic review tool, if applicable. No additional management support is needed unless otherwise documented below in the visit note. 

## 2015-09-26 NOTE — Progress Notes (Addendum)
HPI:   [name] is a 70 y.o.female here today with her husband complaining of 4 weeks of respiratory symptoms.  She has had productive cough with greenish sputum, which is worse in the morning and at night it keeps her from sleep. She denies any history of COPD, asthma, or allergies.She has had similar symptoms in the past, she has been treated with breathing treatments.  She is reporting wheezing, no chest pain or dyspnea. No fever, chills, myalgias, or rash.  No Hx of recent travel. + sick contact,husband. No known insect bite. Denies Hx of allergies.  She has not tried OTC  Symptoms otherwise stable.  Hx of DM II, she doe snot check her BS's. Hx of GERD,denies heartburn.     Review of Systems  Constitutional: Positive for fatigue. Negative for fever and appetite change.  HENT: Positive for rhinorrhea. Negative for congestion, ear pain, mouth sores, sinus pressure, sneezing, sore throat, trouble swallowing and voice change.   Eyes: Negative for discharge, redness and itching.  Respiratory: Positive for cough and wheezing. Negative for shortness of breath.   Cardiovascular: Negative for chest pain, palpitations and leg swelling.  Gastrointestinal: Negative for nausea, vomiting, abdominal pain and diarrhea.  Musculoskeletal: Negative for myalgias, back pain, joint swelling and neck pain.  Skin: Negative for color change and rash.  Allergic/Immunologic: Negative for environmental allergies.  Neurological: Negative for syncope, weakness, numbness and headaches.  Hematological: Negative for adenopathy. Does not bruise/bleed easily.  Psychiatric/Behavioral: Positive for sleep disturbance (due to cough). Negative for confusion.      Current Outpatient Prescriptions on File Prior to Visit  Medication Sig Dispense Refill  . Ascorbic Acid (VITAMIN C PO) Take 1 capsule by mouth daily.    Marland Kitchen aspirin 81 MG tablet Take 81 mg by mouth daily.      . bromocriptine  (PARLODEL) 2.5 MG tablet 1/4 tab daily 8 tablet 11  . Dulaglutide (TRULICITY) 1.5 XT/0.5WP SOPN Inject 1.5 mg into the skin once a week. 4 pen 11  . escitalopram (LEXAPRO) 20 MG tablet take 1 tablet by mouth once daily 90 tablet 1  . glucose blood (ACCU-CHEK COMFORT CURVE) test strip Use to check blood sugar 1 time per day. 100 each 2  . hydrochlorothiazide (HYDRODIURIL) 12.5 MG tablet Take 1 tablet (12.5 mg total) by mouth daily. 90 tablet 1  . Multiple Vitamin (MULITIVITAMIN WITH MINERALS) TABS Take 1 tablet by mouth daily.    . nortriptyline (PAMELOR) 25 MG capsule Take 1 capsule (25 mg total) by mouth at bedtime. 90 capsule 1  . repaglinide (PRANDIN) 0.5 MG tablet Take 1 tablet (0.5 mg total) by mouth 3 (three) times daily before meals. 90 tablet 11  . SYNTHROID 150 MCG tablet Take 1 tablet (150 mcg total) by mouth daily. 90 tablet 3  . Turmeric 500 MG TABS Take 1 capsule by mouth daily.    . valsartan (DIOVAN) 160 MG tablet take 1 tablet by mouth once daily 90 tablet 2  . Vitamin D, Ergocalciferol, (DRISDOL) 50000 units CAPS capsule Take 1 capsule (50,000 Units total) by mouth every 7 (seven) days. 12 capsule 0  . metoprolol succinate (TOPROL-XL) 100 MG 24 hr tablet take 1 tablet by mouth once daily WITH OR IMMEDIATELY FOLLOWING A MEAL 90 tablet 0   No current facility-administered medications on file prior to visit.     Past Medical History  Diagnosis Date  . Diabetes mellitus   . GERD (gastroesophageal reflux disease)   .  Grave's disease   . Hyperlipidemia   . Osteopenia   . Hypertension   . Carotid artery stenosis     Mild  . Thalassemia minor   . Arthritis   . Colon polyps   . Diverticulosis   . IBS (irritable bowel syndrome)   . Diverticulitis    Allergies  Allergen Reactions  . Statins Other (See Comments)    Muscle aches    Social History   Social History  . Marital Status: Married    Spouse Name: N/A  . Number of Children: 2  . Years of Education: N/A    Occupational History  . fundraising    Social History Main Topics  . Smoking status: Never Smoker   . Smokeless tobacco: Never Used  . Alcohol Use: No  . Drug Use: No  . Sexual Activity: Not Asked   Other Topics Concern  . None   Social History Narrative   Lives with husband in a one story home.     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   Education: college.    Filed Vitals:   09/26/15 1506  BP: 110/64  Pulse: 67  Temp: 98.4 F (36.9 C)  Resp: 16   Body mass index is 38.07 kg/(m^2).  SpO2 Readings from Last 3 Encounters:  09/26/15 97%  08/10/15 95%  07/20/15 96%      Physical Exam  Constitutional: She is oriented to person, place, and time. She appears well-developed. She does not appear ill. No distress.  HENT:  Head: Atraumatic.  Right Ear: Tympanic membrane, external ear and ear canal normal.  Left Ear: Tympanic membrane, external ear and ear canal normal.  Nose: Rhinorrhea present. No mucosal edema. Right sinus exhibits no maxillary sinus tenderness and no frontal sinus tenderness. Left sinus exhibits no maxillary sinus tenderness and no frontal sinus tenderness.  Mouth/Throat: Mucous membranes are normal.  Eyes: Conjunctivae and EOM are normal. Pupils are equal, round, and reactive to light.  Neck: No muscular tenderness present.  Cardiovascular: Normal rate and regular rhythm.   Murmur heard.  Systolic murmur is present  SEM II-III/VI RUSB>LUSB.  Respiratory: Effort normal. No respiratory distress. She has wheezes (diffuse).  Musculoskeletal: She exhibits no edema.  Lymphadenopathy:       Head (right side): No submandibular adenopathy present.       Head (left side): No submandibular adenopathy present.    She has no cervical adenopathy.  Neurological: She is alert and oriented to person, place, and time.  Skin: Skin is warm. No rash noted.  Psychiatric: She has a normal mood  and affect.  Well groomed, good eye contact.      ASSESSMENT AND PLAN:    [name] was seen today for uri.  Diagnoses and all orders for this visit:  Respiratory tract infection   Explained that current symptoms could be residual after a viral URI. Because findings on exam + DM II, abx treatment was recommended. CXR ordered. After Duoneb neb no rales or rhonchi, prolonged expiration. Instructed about warning signs. F/U in 2 weeks.  -     doxycycline (MONODOX) 100 MG capsule; Take 1 capsule (100 mg total) by mouth 2 (two) times daily. -     X-ray chest PA and lateral; Future -     ipratropium-albuterol (DUONEB) 0.5-2.5 (3) MG/3ML nebulizer solution 3 mL; Take 3 mLs by nebulization once.  Wheezing  Hyperactive airway, which could also be  related to recent viral illness. Reporting prior episodes, ? COPD/asthma. Improved after Duoneb neb. Prednisone side effects discussed, I think she will benefit for short course. Albuterol inh qid x 1 week then prn. F/U in 2 weeks, before if needed. Clearly instructed about warning signs.  -     X-ray chest PA and lateral; Future -     ipratropium-albuterol (DUONEB) 0.5-2.5 (3) MG/3ML nebulizer solution 3 mL; Take 3 mLs by nebulization once. -     predniSONE (DELTASONE) 20 MG tablet; Take 2 tablets (40 mg total) by mouth daily with breakfast. -     albuterol (PROVENTIL HFA;VENTOLIN HFA) 108 (90 Base) MCG/ACT inhaler; Inhale 2 puffs into the lungs every 6 (six) hours as needed for wheezing or shortness of breath.   Cough  Side effects of Hycodan discussed, recommended taking it at night to help her sleep. Albuterol inh and Prednisone may also help. Explained it could last a few weeks after URI and GERD may contribute some. Will consider PPI if cough becomes persistent. F/U in 2 weeks.     -     HYDROcodone-homatropine (HYCODAN) 5-1.5 MG/5ML syrup; Take 5 mLs by mouth at bedtime as needed for cough.           -Patient advised  to return sooner than planned today or notify a doctor immediately if symptoms worsen or new concerns arise. She voices understanding.       Kenyatta Keidel G. [name], MD  [facility]. [place] office.

## 2015-09-26 NOTE — Patient Instructions (Signed)
A few things to remember from today's visit:   1. Respiratory tract infection  - doxycycline (MONODOX) 100 MG capsule; Take 1 capsule (100 mg total) by mouth 2 (two) times daily.  Dispense: 14 capsule; Refill: 0 - X-ray chest PA and lateral; Future - ipratropium-albuterol (DUONEB) 0.5-2.5 (3) MG/3ML nebulizer solution 3 mL; Take 3 mLs by nebulization once.  2. Wheezing  - X-ray chest PA and lateral; Future - ipratropium-albuterol (DUONEB) 0.5-2.5 (3) MG/3ML nebulizer solution 3 mL; Take 3 mLs by nebulization once. - predniSONE (DELTASONE) 20 MG tablet; Take 2 tablets (40 mg total) by mouth daily with breakfast.  Dispense: 6 tablet; Refill: 0 - albuterol (PROVENTIL HFA;VENTOLIN HFA) 108 (90 Base) MCG/ACT inhaler; Inhale 2 puffs into the lungs every 6 (six) hours as needed for wheezing or shortness of breath.  Dispense: 1 Inhaler; Refill: 0  3. Cough  - HYDROcodone-homatropine (HYCODAN) 5-1.5 MG/5ML syrup; Take 5 mLs by mouth at bedtime as needed for cough.  Dispense: 100 mL; Refill: 0    Most likely viral but because Hx of diabetes + wheezing antibiotic recommended. Monitor sugar, Prednisone could increase numbers. Albuterol for a week then as needed. Chest X ray. F/U in 2 weeks, before if needed.  If worsening symptoms seek immediate attention.   If you sign-up for My chart, you can communicate easier with [name] in case you have any question or concern.

## 2015-09-27 ENCOUNTER — Ambulatory Visit (INDEPENDENT_AMBULATORY_CARE_PROVIDER_SITE_OTHER)
Admission: RE | Admit: 2015-09-27 | Discharge: 2015-09-27 | Disposition: A | Discharge status: Home / Self Care | Payer: Medicare Other | Source: Ambulatory Visit | Attending: Family Medicine | Admitting: Family Medicine

## 2015-09-27 DIAGNOSIS — R05 Cough: Secondary | ICD-10-CM | POA: Diagnosis not present

## 2015-09-27 DIAGNOSIS — R062 Wheezing: Secondary | ICD-10-CM | POA: Diagnosis not present

## 2015-09-27 DIAGNOSIS — J988 Other specified respiratory disorders: Secondary | ICD-10-CM | POA: Diagnosis not present

## 2015-09-27 DIAGNOSIS — R0602 Shortness of breath: Secondary | ICD-10-CM | POA: Diagnosis not present

## 2015-09-28 ENCOUNTER — Telehealth: Payer: Self-pay

## 2015-09-28 NOTE — Telephone Encounter (Signed)
Called patient. No answer. Left vmail to return call.

## 2015-09-28 NOTE — Telephone Encounter (Signed)
-----   Message from [name] [name], MD sent at 09/28/2015  1:39 PM EDT ----- CXR negative for pneumonia or acute process.   Thanks!

## 2015-09-29 ENCOUNTER — Telehealth: Payer: Self-pay

## 2015-09-29 NOTE — Telephone Encounter (Signed)
This encounter was created in error - please disregard.

## 2015-09-29 NOTE — Telephone Encounter (Signed)
-----   Message from [name] [name], MD sent at 09/28/2015  1:39 PM EDT ----- CXR negative for pneumonia or acute process.   Thanks!

## 2015-09-29 NOTE — Telephone Encounter (Signed)
Spoke to spouse. Gave CXR results. Spouse verbalized understanding.

## 2015-10-04 ENCOUNTER — Telehealth: Payer: Self-pay | Admitting: Family Medicine

## 2015-10-04 NOTE — Telephone Encounter (Signed)
Pt call to ask for a referral to see  PULMONARY doctor and [place]. She said she has a enlarged heart and her last chest xray showed she had issues that is why she want to see the specialist

## 2015-10-06 ENCOUNTER — Other Ambulatory Visit: Payer: Self-pay | Admitting: Family Medicine

## 2015-10-06 DIAGNOSIS — J4520 Mild intermittent asthma, uncomplicated: Secondary | ICD-10-CM

## 2015-10-06 NOTE — Telephone Encounter (Signed)
CXR that was done recently was ordered to evaluate for acute process as pneumonia, it was negative for this. Enalarged heart has been reported on prior imaging, CXR is not very specific for heart size evaluation.She already had an echo done in 05/2014.  Chronic bronchitis changes, are not concerning at this time and may be indicative of COPD. COPD causes intermittent episodes of wheezing and cough, usually treated with inhalers. I was planning to re-evaluate during follow up appt and decide about the need for further treatment/studies. I did not think pulmonologist referral was necessary based on my examination and CXR but as requested pulmonologist referral was placed. If symptoms are improved she can cancel follow up appt with me (if she scheduled as recommended) and follow with pulmonologist. Thanks, BJ

## 2015-10-07 ENCOUNTER — Ambulatory Visit: Payer: Medicare Other | Admitting: Endocrinology

## 2015-10-07 ENCOUNTER — Ambulatory Visit (INDEPENDENT_AMBULATORY_CARE_PROVIDER_SITE_OTHER): Payer: Medicare Other | Admitting: Endocrinology

## 2015-10-07 ENCOUNTER — Encounter: Payer: Self-pay | Admitting: Endocrinology

## 2015-10-07 VITALS — BP 130/58 | HR 75 | Temp 98.4°F | Ht 64.5 in | Wt 189.2 lb

## 2015-10-07 DIAGNOSIS — E119 Type 2 diabetes mellitus without complications: Secondary | ICD-10-CM

## 2015-10-07 LAB — CBC WITH DIFFERENTIAL/PLATELET
Basophils Absolute: 0 10*3/uL (ref 0.0–0.1)
Basophils Relative: 0.6 % (ref 0.0–3.0)
Eosinophils Absolute: 0.1 10*3/uL (ref 0.0–0.7)
Eosinophils Relative: 1.7 % (ref 0.0–5.0)
HCT: 37 % (ref 36.0–46.0)
Hemoglobin: 11.7 g/dL — ABNORMAL LOW (ref 12.0–15.0)
Lymphocytes Relative: 21.7 % (ref 12.0–46.0)
Lymphs Abs: 1.5 10*3/uL (ref 0.7–4.0)
MCHC: 31.6 g/dL (ref 30.0–36.0)
MCV: 63.4 fl — ABNORMAL LOW (ref 78.0–100.0)
Monocytes Absolute: 0.6 10*3/uL (ref 0.1–1.0)
Monocytes Relative: 8.3 % (ref 3.0–12.0)
Neutro Abs: 4.7 10*3/uL (ref 1.4–7.7)
Neutrophils Relative %: 67.7 % (ref 43.0–77.0)
Platelets: 243 10*3/uL (ref 150.0–400.0)
RBC: 5.83 Mil/uL — ABNORMAL HIGH (ref 3.87–5.11)
RDW: 14.6 % (ref 11.5–15.5)
WBC: 7 10*3/uL (ref 4.0–10.5)

## 2015-10-07 LAB — BASIC METABOLIC PANEL
BUN: 21 mg/dL (ref 6–23)
CO2: 29 mEq/L (ref 19–32)
Calcium: 9.7 mg/dL (ref 8.4–10.5)
Chloride: 99 mEq/L (ref 96–112)
Creatinine, Ser: 0.77 mg/dL (ref 0.40–1.20)
GFR: 78.34 mL/min (ref >60.00)
Glucose, Bld: 214 mg/dL — ABNORMAL HIGH (ref 70–99)
Potassium: 4.2 mEq/L (ref 3.5–5.1)
Sodium: 134 mEq/L — ABNORMAL LOW (ref 135–145)

## 2015-10-07 LAB — HEPATIC FUNCTION PANEL
ALT: 75 U/L — ABNORMAL HIGH (ref 0–35)
AST: 66 U/L — ABNORMAL HIGH (ref 0–37)
Albumin: 3.9 g/dL (ref 3.5–5.2)
Alkaline Phosphatase: 64 U/L (ref 39–117)
Bilirubin, Direct: 0.2 mg/dL (ref 0.0–0.3)
Total Bilirubin: 0.8 mg/dL (ref 0.2–1.2)
Total Protein: 7.1 g/dL (ref 6.0–8.3)

## 2015-10-07 LAB — POCT GLYCOSYLATED HEMOGLOBIN (HGB A1C): Hemoglobin A1C: 7.1

## 2015-10-07 LAB — MICROALBUMIN / CREATININE URINE RATIO
Creatinine,U: 63.5 mg/dL
Microalb Creat Ratio: 1.3 mg/g (ref 0.0–30.0)
Microalb, Ur: 0.8 mg/dL (ref 0.0–1.9)

## 2015-10-07 LAB — TSH: TSH: 1.89 u[IU]/mL (ref 0.35–4.50)

## 2015-10-07 MED ORDER — REPAGLINIDE 1 MG PO TABS: 1.0000 mg | ORAL_TABLET | Freq: Three times a day (TID) | ORAL | Status: DC

## 2015-10-07 NOTE — Progress Notes (Signed)
Subjective:    Patient ID: [name], female    DOB: Dec 03, 1945, 70 y.o.   MRN: [id number]  HPI Pt returns for f/u of diabetes mellitus: DM type: 2.  Dx'ed: 2003.  Complications: none.   Therapy: 2 oral meds, and trulicity.    GDM: never.   DKA: never.  Severe hypoglycemia: never.  Pancreatitis: never.  Other: she has never been on insulin; she stopped metformin due to nausea and diarrhea; pioglitizone is precluded by edema. Interval history: She takes meds as rx'ed.  pt states she feels well in general, except for fatigue.   Past Medical History  Diagnosis Date  . Diabetes mellitus   . GERD (gastroesophageal reflux disease)   . Grave's disease   . Hyperlipidemia   . Osteopenia   . Hypertension   . Carotid artery stenosis     Mild  . Thalassemia minor   . Arthritis   . Colon polyps   . Diverticulosis   . IBS (irritable bowel syndrome)   . Diverticulitis     Past Surgical History  Procedure Laterality Date  . Hysteroscopy      fibroids  . Laparoscopy      fibroids  . Cholecystectomy    . Hiatal hernia repair      Social History   Social History  . Marital Status: Married    Spouse Name: N/A  . Number of Children: 2  . Years of Education: N/A   Occupational History  . fundraising    Social History Main Topics  . Smoking status: Never Smoker   . Smokeless tobacco: Never Used  . Alcohol Use: No  . Drug Use: No  . Sexual Activity: Not on file   Other Topics Concern  . Not on file   Social History Narrative   Lives with husband in a one story home.     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   Education: college.    Current Outpatient Prescriptions on File Prior to Visit  Medication Sig Dispense Refill  . albuterol (PROVENTIL HFA;VENTOLIN HFA) 108 (90 Base) MCG/ACT inhaler Inhale 2 puffs into the lungs every 6 (six) hours as needed for wheezing or shortness of breath. 1  Inhaler 0  . Ascorbic Acid (VITAMIN C PO) Take 1 capsule by mouth daily.    Marland Kitchen aspirin 81 MG tablet Take 81 mg by mouth daily.      . bromocriptine (PARLODEL) 2.5 MG tablet 1/4 tab daily 8 tablet 11  . Dulaglutide (TRULICITY) 1.5 FG/1.8EX SOPN Inject 1.5 mg into the skin once a week. 4 pen 11  . escitalopram (LEXAPRO) 20 MG tablet take 1 tablet by mouth once daily 90 tablet 1  . glucose blood (ACCU-CHEK COMFORT CURVE) test strip Use to check blood sugar 1 time per day. 100 each 2  . hydrochlorothiazide (HYDRODIURIL) 12.5 MG tablet Take 1 tablet (12.5 mg total) by mouth daily. 90 tablet 1  . metoprolol succinate (TOPROL-XL) 100 MG 24 hr tablet take 1 tablet by mouth once daily WITH OR IMMEDIATELY FOLLOWING A MEAL 90 tablet 0  . Multiple Vitamin (MULITIVITAMIN WITH MINERALS) TABS Take 1 tablet by mouth daily.    . nortriptyline (PAMELOR) 25 MG capsule Take 1 capsule (25 mg total) by mouth at bedtime. 90 capsule 1  . SYNTHROID 150 MCG tablet Take 1 tablet (150 mcg total) by mouth daily. 90 tablet 3  . Turmeric 500 MG  TABS Take 1 capsule by mouth daily.    . valsartan (DIOVAN) 160 MG tablet take 1 tablet by mouth once daily 90 tablet 2  . Vitamin D, Ergocalciferol, (DRISDOL) 50000 units CAPS capsule Take 1 capsule (50,000 Units total) by mouth every 7 (seven) days. 12 capsule 0   No current facility-administered medications on file prior to visit.    Allergies  Allergen Reactions  . Statins Other (See Comments)    Muscle aches    Family History  Problem Relation Age of Onset  . Adopted: Yes  . Colon cancer Neg Hx   . Pancreatic cancer Neg Hx   . Rectal cancer Neg Hx   . Stomach cancer Neg Hx   . Healthy Son     x 2  . Headache      Cluster headaches    BP 130/58 mmHg  Pulse 75  Temp(Src) 98.4 F (36.9 C) (Oral)  Ht 5' 4.5" (1.638 m)  Wt 189 lb 4 oz (85.843 kg)  BMI 31.99 kg/m2  SpO2 96%  Review of Systems She denies hypoglycemia.      Objective:   Physical Exam VITAL  SIGNS:  See vs page GENERAL: no distress Pulses: dorsalis pedis intact bilat.   MSK: no deformity of the feet CV: no leg edema Skin:  no ulcer on the feet.  normal color and temp on the feet. Neuro: sensation is intact to touch on the feet   A1c=7.1%    Assessment & Plan:  Type 2 DM: she needs increased rx, if it can be done with a regimen that avoids or minimizes hypoglycemia.  Patient is advised the following: Patient Instructions  Please come back for a follow-up appointment in 2 months.  i have sent a prescription to your pharmacy, to double the repaglinide.  blood tests are requested for you today.  We'll let you know about the results.   Please continue the same other diabetes medications.   check your blood sugar once a day.  vary the time of day when you check, between before the 3 meals, and at bedtime.  also check if you have symptoms of your blood sugar being too high or too low.  please keep a record of the readings and bring it to your next appointment here.  You can write it on any piece of paper.  please call us sooner if your blood sugar goes below 70, or if you have a lot of readings over 200.    Renato Shin, MD

## 2015-10-07 NOTE — Progress Notes (Signed)
Pre visit review using our clinic review tool, if applicable. No additional management support is needed unless otherwise documented below in the visit note. 

## 2015-10-07 NOTE — Telephone Encounter (Signed)
Spoke with pt's husband and informed him that the referral has been placed. They will discuss whether or not to keep the Monday appt.

## 2015-10-07 NOTE — Patient Instructions (Addendum)
Please come back for a follow-up appointment in 2 months.  i have sent a prescription to your pharmacy, to double the repaglinide.  blood tests are requested for you today.  We'll let you know about the results.   Please continue the same other diabetes medications.   check your blood sugar once a day.  vary the time of day when you check, between before the 3 meals, and at bedtime.  also check if you have symptoms of your blood sugar being too high or too low.  please keep a record of the readings and bring it to your next appointment here.  You can write it on any piece of paper.  please call us sooner if your blood sugar goes below 70, or if you have a lot of readings over 200.

## 2015-10-10 ENCOUNTER — Encounter: Payer: Self-pay | Admitting: Family Medicine

## 2015-10-10 ENCOUNTER — Other Ambulatory Visit: Payer: Medicare Other

## 2015-10-10 ENCOUNTER — Ambulatory Visit (INDEPENDENT_AMBULATORY_CARE_PROVIDER_SITE_OTHER): Payer: Medicare Other | Admitting: Family Medicine

## 2015-10-10 VITALS — BP 112/60 | HR 78 | Temp 98.7°F | Resp 12 | Ht 64.5 in | Wt 190.0 lb

## 2015-10-10 DIAGNOSIS — J4520 Mild intermittent asthma, uncomplicated: Secondary | ICD-10-CM | POA: Diagnosis not present

## 2015-10-10 DIAGNOSIS — I10 Essential (primary) hypertension: Secondary | ICD-10-CM | POA: Diagnosis not present

## 2015-10-10 DIAGNOSIS — I5030 Unspecified diastolic (congestive) heart failure: Secondary | ICD-10-CM | POA: Insufficient documentation

## 2015-10-10 DIAGNOSIS — I5032 Chronic diastolic (congestive) heart failure: Secondary | ICD-10-CM | POA: Diagnosis not present

## 2015-10-10 MED ORDER — BUDESONIDE-FORMOTEROL FUMARATE 80-4.5 MCG/ACT IN AERO: 2.0000 | INHALATION_SPRAY | Freq: Two times a day (BID) | RESPIRATORY_TRACT | Status: DC

## 2015-10-10 NOTE — Progress Notes (Signed)
HPI:   [name] is a 70 y.o. female, who is here today to follow on recent office visit.   She was seen on 09/26/15 because persistent cough and wheezing. No prior Hx of asthma or COPD, denies Hx of tobacco use. She has had intermittent episodes of cough and wheezing for the past 2-3 years.  Overall she is feeling slightly better, no new symptoms reported. Still coughing, mainly at night. She takes Hycodan as needed and it helps. She completed Doxycycline and Prednisone Rx, she did not use Albuterol inh as instructed. Tolerated medications well, no side effects reported.    CXR done 09/26/15: enlargement of cardiac silhouette with chronic bronchitic changes.No acute infiltrate.    Concerns today: CXR results. She is very concerned about "enlarged heart" reported in CXR. She wants to know when she needs to get worry about her heart size and when her heart is considered to be "too big." Also c/o fatigue, which she has has for 1 month or so, related to respiratory illness.  Hx of "heart murmur"and cardiomegaly, she has followed with Dr Breanna Julian years ago and would like a referral.  Denies chest pain, dyspnea, palpitation, orthopnea, PND, or worsen edema (peri-ankle).  Echo done 05/2014: LVEF 55-60 % and G 1 diastolic dysfunction. She is also concerned about chronic bronchitic changes and requested a pulmonology referral.   She is also reporting spirometry done years ago and "it was ok."  Hx of GERD, reporting symptoms as stable.   Her BP is on the lower normal range, similar readings last OV. She is not checking ehr BP at home. She is on Metoprolol Succinate, Diovan, and HCTZ.    Review of Systems  Constitutional: Positive for fatigue. Negative for fever, activity change, appetite change and unexpected weight change.  HENT: Negative for facial swelling and trouble swallowing.   Respiratory: Positive for cough and wheezing. Negative for apnea, shortness of breath  and stridor.   Cardiovascular: Positive for leg swelling (peri-ankle, chronic and stable.). Negative for chest pain and palpitations.  Gastrointestinal: Negative for nausea, vomiting and abdominal pain.       No changes in bowel habits.  Genitourinary: Negative for dysuria, hematuria and decreased urine volume.  Skin: Negative for rash.  Neurological: Negative for syncope, weakness and headaches.  Psychiatric/Behavioral: Negative for confusion. The patient is nervous/anxious.       Current Outpatient Prescriptions on File Prior to Visit  Medication Sig Dispense Refill  . albuterol (PROVENTIL HFA;VENTOLIN HFA) 108 (90 Base) MCG/ACT inhaler Inhale 2 puffs into the lungs every 6 (six) hours as needed for wheezing or shortness of breath. 1 Inhaler 0  . Ascorbic Acid (VITAMIN C PO) Take 1 capsule by mouth daily.    Marland Kitchen aspirin 81 MG tablet Take 81 mg by mouth daily.      . bromocriptine (PARLODEL) 2.5 MG tablet 1/4 tab daily 8 tablet 11  . Dulaglutide (TRULICITY) 1.5 LD/3.5TS SOPN Inject 1.5 mg into the skin once a week. 4 pen 11  . escitalopram (LEXAPRO) 20 MG tablet take 1 tablet by mouth once daily 90 tablet 1  . glucose blood (ACCU-CHEK COMFORT CURVE) test strip Use to check blood sugar 1 time per day. 100 each 2  . hydrochlorothiazide (HYDRODIURIL) 12.5 MG tablet Take 1 tablet (12.5 mg total) by mouth daily. 90 tablet 1  . metoprolol succinate (TOPROL-XL) 100 MG 24 hr tablet take 1 tablet by mouth once daily WITH OR IMMEDIATELY FOLLOWING A MEAL  90 tablet 0  . Multiple Vitamin (MULITIVITAMIN WITH MINERALS) TABS Take 1 tablet by mouth daily.    . nortriptyline (PAMELOR) 25 MG capsule Take 1 capsule (25 mg total) by mouth at bedtime. 90 capsule 1  . repaglinide (PRANDIN) 1 MG tablet Take 1 tablet (1 mg total) by mouth 3 (three) times daily before meals. 270 tablet 3  . SYNTHROID 150 MCG tablet Take 1 tablet (150 mcg total) by mouth daily. 90 tablet 3  . Turmeric 500 MG TABS Take 1 capsule by  mouth daily.    . valsartan (DIOVAN) 160 MG tablet take 1 tablet by mouth once daily 90 tablet 2  . Vitamin D, Ergocalciferol, (DRISDOL) 50000 units CAPS capsule Take 1 capsule (50,000 Units total) by mouth every 7 (seven) days. 12 capsule 0   No current facility-administered medications on file prior to visit.     Past Medical History  Diagnosis Date  . Diabetes mellitus   . GERD (gastroesophageal reflux disease)   . Grave's disease   . Hyperlipidemia   . Osteopenia   . Hypertension   . Carotid artery stenosis     Mild  . Thalassemia minor   . Arthritis   . Colon polyps   . Diverticulosis   . IBS (irritable bowel syndrome)   . Diverticulitis    Allergies  Allergen Reactions  . Statins Other (See Comments)    Muscle aches    Social History   Social History  . Marital Status: Married    Spouse Name: N/A  . Number of Children: 2  . Years of Education: N/A   Occupational History  . fundraising    Social History Main Topics  . Smoking status: Never Smoker   . Smokeless tobacco: Never Used  . Alcohol Use: No  . Drug Use: No  . Sexual Activity: Not Asked   Other Topics Concern  . None   Social History Narrative   Lives with husband in a one story home.     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   Education: college.    Filed Vitals:   10/10/15 1602  BP: 112/60  Pulse: 78  Temp: 98.7 F (37.1 C)  Resp: 12   Body mass index is 32.12 kg/(m^2).   SpO2 Readings from Last 3 Encounters:  10/10/15 98%  10/07/15 96%  09/26/15 97%     Physical Exam  Constitutional: She is oriented to person, place, and time. She appears well-developed. No distress.  HENT:  Head: Atraumatic.  Mouth/Throat: Oropharynx is clear and moist and mucous membranes are normal.  Eyes: Conjunctivae and EOM are normal.  Neck: No JVD present. No tracheal deviation present.  Cardiovascular: Normal rate and  regular rhythm.   Murmur (SEM II/VI base.) heard. Respiratory: Effort normal. No respiratory distress. She has wheezes (Mild, diffuse-bilateral).  GI: Soft. She exhibits no mass. There is no tenderness.  Musculoskeletal: She exhibits edema (muld peri-ankle edema, non pitting, bilateral.).  Lymphadenopathy:    She has no cervical adenopathy.  Neurological: She is alert and oriented to person, place, and time. She has normal strength. Coordination and gait normal.  Skin: Skin is warm. No erythema.  Psychiatric: Her speech is normal. Her mood appears anxious.  Well groomed, good eye contact.      ASSESSMENT AND PLAN:     Aashi was seen today for follow-up.  Diagnoses and all orders for this visit:  Chronic  diastolic congestive heart failure (HCC)  Low salt diet, adequate BP controlled. She is on BB and ARB. Asymptomatic.   -     Ambulatory referral to Cardiology   Reactive airway disease, mild intermittent, uncomplicated  We dicussed possible causes. Symbicort may help. Albuterol inh to use as needed, she could use it q 6 hours for a week then as needed, some side effects discussed. Keep appt with pulmonologist. Instructed about warning signs.  -     budesonide-formoterol (SYMBICORT) 80-4.5 MCG/ACT inhaler; Inhale 2 puffs into the lungs 2 (two) times daily.   Essential hypertension, benign  BP on lower normal range. Monitor BP at home and bring numbers to her cardiology OV, may need to decrease antihypertensive meds. Low salt diet.       -She was advised to return or notify a doctor immediately if symptoms worsen or persist or new concerns arise.       [name]. [name], MD  [facility]. [place] office.

## 2015-10-10 NOTE — Progress Notes (Signed)
Pre visit review using our clinic review tool, if applicable. No additional management support is needed unless otherwise documented below in the visit note. 

## 2015-10-10 NOTE — Patient Instructions (Signed)
A few things to remember from today's visit:   1. Chronic diastolic congestive heart failure ([place])  - Ambulatory referral to Cardiology  2. Reactive airway disease, mild intermittent, uncomplicated  - budesonide-formoterol (SYMBICORT) 80-4.5 MCG/ACT inhaler; Inhale 2 puffs into the lungs 2 (two) times daily.  Dispense: 1 Inhaler; Refill: 3    Albuterol inh 2 puff q 6 hours for a week then as needed.  Symbicort daily. Low salt diet. Monitor blood pressure at home.    If you sign-up for My chart, you can communicate easier with [name] in case you have any question or concern.

## 2015-10-14 ENCOUNTER — Ambulatory Visit (INDEPENDENT_AMBULATORY_CARE_PROVIDER_SITE_OTHER): Payer: Medicare Other | Admitting: Internal Medicine

## 2015-10-14 ENCOUNTER — Encounter: Payer: Self-pay | Admitting: Internal Medicine

## 2015-10-14 VITALS — BP 100/58 | HR 74 | Ht 65.0 in | Wt 192.0 lb

## 2015-10-14 DIAGNOSIS — R05 Cough: Secondary | ICD-10-CM

## 2015-10-14 DIAGNOSIS — R058 Other specified cough: Secondary | ICD-10-CM

## 2015-10-14 MED ORDER — PREDNISONE 10 MG PO TABS: ORAL_TABLET | ORAL | Status: DC

## 2015-10-14 MED ORDER — PANTOPRAZOLE SODIUM 40 MG PO TBEC: 40.0000 mg | DELAYED_RELEASE_TABLET | Freq: Every day | ORAL | Status: DC

## 2015-10-14 NOTE — Patient Instructions (Addendum)
The key to effective treatment for your cough is eliminating the non-stop cycle of cough you're stuck in long enough to let your airway heal completely and then see if there is anything still making you cough once you stop the cough suppression, but this should take no more than 5 days to figure out  Eliminate all coughing for 3 days with your codiene syrup and then switch delsym 2 tsp every 12 hours   Prednisone 10 mg take  4 each am x 2 days,   2 each am x 2 days,  1 each am x 2 days and stop (this is to eliminate allergies and inflammation from coughing)  Protonix (pantoprazole) Take 30-60 min before first meal of the day and Pepcid 20 mg one bedtime plus chlorpheniramine 4 mg x 2 at bedtime (both available over the counter)  until cough is completely gone for at least a week without the need for cough suppression  GERD (REFLUX)  is an extremely common cause of respiratory symptoms, many times with no significant heartburn at all.    It can be treated with medication, but also with lifestyle changes including avoidance of late meals, excessive alcohol, smoking cessation, and avoid fatty foods, chocolate, peppermint, colas, red wine, and acidic juices such as orange juice.  NO MINT OR MENTHOL PRODUCTS SO NO COUGH DROPS  USE HARD CANDY INSTEAD (jolley ranchers or Stover's or Lifesavers (all available in sugarless versions) NO OIL BASED VITAMINS - use powdered substitutes.   Return in 2 weeks if not better

## 2015-10-14 NOTE — Progress Notes (Signed)
Subjective:     Patient ID: Breanna Webster, female   DOB: June 12, 1945,    MRN: [id number]  HPI  11 yowf never smoker with pnds as child allergy tested as child doesn't know details, never took shots,  outgrew completely then symptoms resumed in 90's after moved to [place] from [facility] with coughing that improved p NF late 90s but still had drainage varialby severe no pattern as to time of year then much worse since summer 2016 with onset of cough/wheeze May 2017 referred to pulmonary clinic 10/14/2015 by Dr Inez Catalina [name]   10/14/2015 1st [facility] office visit/ Wert   Chief Complaint  Patient presents with  . Pulmonary Consult    Referred by Dr. Betty [name]. Pt c/o cough and wheezing x 5 wks. Cough is occ prod with minimal green sputum.   acute onset cough > green mucus one week p husband's uri >  rx with abx/ prednisone improved then worse 2 days p finished but mostly productive  white mucus.  If doesn't take the codeine cough med then coughs worse at hs.  No assoc sob/ some wheeze better with saba   No obvious day to day or daytime variability or assoc excess/ purulent sputum or mucus plugs or hemoptysis or cp or chest tightness, subjective wheeze or overt sinus or hb symptoms. No unusual exp hx or h/o childhood pna/ asthma or knowledge of premature birth.  Sleeping ok without nocturnal  or early am exacerbation  of respiratory  c/o's or need for noct saba. Also denies any obvious fluctuation of symptoms with weather or environmental changes or other aggravating or alleviating factors except as outlined above   Current Medications, Allergies, Complete Past Medical History, Past Surgical History, Family History, and Social History were reviewed in [employer] record.  ROS  The following are not active complaints unless bolded sore throat, dysphagia, dental problems, itching, sneezing,  nasal congestion or excess/ purulent secretions, ear ache,   fever, chills, sweats,  unintended wt loss, classically pleuritic or exertional cp,  orthopnea pnd or leg swelling, presyncope, palpitations, abdominal pain, anorexia, nausea, vomiting, diarrhea  or change in bowel or bladder habits, change in stools or urine, dysuria,hematuria,  rash, arthralgias, visual complaints, headache, numbness, weakness or ataxia or problems with walking or coordination,  change in mood/affect or memory.           Review of Systems     Objective:   Physical Exam    amb wf nad/ pseudowheeze only   Wt Readings from Last 3 Encounters:  10/14/15 192 lb (87.091 kg)  10/10/15 190 lb (86.183 kg)  10/07/15 189 lb 4 oz (85.843 kg)    Vital signs reviewed   HEENT: nl dentition, turbinates, and oropharynx. Nl external ear canals without cough reflex   NECK :  without JVD/Nodes/TM/ nl carotid upstrokes bilaterally   LUNGS: no acc muscle use,  Nl contour chest which is clear to A and P bilaterally with cough on inspiration  CV:  RRR  no s3 or murmur or increase in P2, no edema   ABD:  Obese/ soft and nontender with nl inspiratory excursion in the supine position. No bruits or organomegaly, bowel sounds nl  MS:  Nl gait/ ext warm without deformities, calf tenderness, cyanosis or clubbing No obvious joint restrictions   SKIN: warm and dry without lesions    NEURO:  alert, approp, nl sensorium with  no motor deficits  I personally reviewed images and agree with radiology impression as follows:  CXR:  09/27/15  Enlargement of cardiac silhouette with chronic bronchitic changes.  No acute infiltrate.  Assessment:

## 2015-10-16 NOTE — Assessment & Plan Note (Signed)
The most common causes of chronic cough in immunocompetent adults include the following: upper airway cough syndrome (UACS), previously referred to as postnasal drip syndrome (PNDS), which is caused by variety of rhinosinus conditions; (2) asthma; (3) GERD; (4) chronic bronchitis from cigarette smoking or other inhaled environmental irritants; (5) nonasthmatic eosinophilic bronchitis; and (6) bronchiectasis.   These conditions, singly or in combination, have accounted for up to 94% of the causes of chronic cough in prospective studies.   Other conditions have constituted no >6% of the causes in prospective studies These have included bronchogenic carcinoma, chronic interstitial pneumonia, sarcoidosis, left ventricular failure, ACEI-induced cough, and aspiration from a condition associated with pharyngeal dysfunction.    Chronic cough is often simultaneously caused by more than one condition. A single cause has been found from 38 to 82% of the time, multiple causes from 18 to 62%. Multiply caused cough has been the result of three diseases up to 42% of the time.       Based on hx and exam, this is most likely:  Classic Upper airway cough syndrome, so named because it's frequently impossible to sort out how much is  CR/sinusitis with freq throat clearing (which can be related to primary GERD)   vs  causing  secondary (" extra esophageal")  GERD from wide swings in gastric pressure that occur with throat clearing, often  promoting self use of mint and menthol lozenges that reduce the lower esophageal sphincter tone and exacerbate the problem further in a cyclical fashion.   These are the same pts (now being labeled as having "irritable larynx syndrome" by some cough centers) who not infrequently have a history of having failed to tolerate ace inhibitors,  dry powder inhalers or biphosphonates or report having atypical reflux symptoms that don't respond to standard doses of PPI , and are easily confused as  having aecopd or asthma flares by even experienced allergists/ pulmonologists.    Of the three most common causes of chronic cough, only one (GERD)  can actually cause the other two (asthma and post nasal drip syndrome)  and perpetuate the cylce of cough inducing airway trauma, inflammation, heightened sensitivity to reflux which is prompted by the cough itself via a cyclical mechanism.    This may partially respond to steroids and look like asthma and post nasal drainage but never erradicated completely unless the cough and the secondary reflux are eliminated, preferably both at the same time.  While not intuitively obvious, many patients with chronic low grade reflux do not cough until there is a secondary insult that disturbs the protective epithelial barrier and exposes sensitive nerve endings.  This can be viral or direct physical injury such as with an endotracheal tube.   The point is that once this occurs, it is difficult to eliminate using anything but a maximally effective acid suppression regimen at least in the short run, accompanied by an appropriate diet to address non acid GERD.    The first step is to maximize acid suppression and eliminate cyclical coughing then regroup if the cough persists.   Total time devoted to counseling  = 35/27mreview case with pt/husband discussion of options/alternatives/ personally creating written instructions  in presence of pt  then going over those specific  Instructions directly with the pt including how to use all of the meds but in particular covering each new medication in detail and the difference between the maintenance/automatic meds and the prns using an action plan format for the latter.

## 2015-10-19 ENCOUNTER — Telehealth: Payer: Self-pay | Admitting: Endocrinology

## 2015-10-19 NOTE — Telephone Encounter (Signed)
There is no alternative which would be less expensive, she should discuss with Dr. Loanne Drilling next week

## 2015-10-19 NOTE — Telephone Encounter (Signed)
The trulicity is too expensive because she is in the donut hole, it now costs her $200 is there anything we can suggest that is cheaper

## 2015-10-19 NOTE — Telephone Encounter (Signed)
Could you review not below and advise how to proceed during Dr. Cordelia Pen absence?

## 2015-10-20 NOTE — Telephone Encounter (Signed)
Requested a call back to discuss.

## 2015-11-02 ENCOUNTER — Encounter: Payer: Self-pay | Admitting: Internal Medicine

## 2015-11-02 ENCOUNTER — Ambulatory Visit (INDEPENDENT_AMBULATORY_CARE_PROVIDER_SITE_OTHER): Payer: Medicare Other | Admitting: Internal Medicine

## 2015-11-02 ENCOUNTER — Telehealth: Payer: Self-pay | Admitting: Endocrinology

## 2015-11-02 ENCOUNTER — Other Ambulatory Visit (INDEPENDENT_AMBULATORY_CARE_PROVIDER_SITE_OTHER): Payer: Medicare Other

## 2015-11-02 VITALS — BP 106/60 | HR 78 | Ht 65.0 in | Wt 193.0 lb

## 2015-11-02 DIAGNOSIS — R05 Cough: Secondary | ICD-10-CM | POA: Diagnosis not present

## 2015-11-02 DIAGNOSIS — R058 Other specified cough: Secondary | ICD-10-CM

## 2015-11-02 DIAGNOSIS — I10 Essential (primary) hypertension: Secondary | ICD-10-CM

## 2015-11-02 LAB — CBC WITH DIFFERENTIAL/PLATELET
Basophils Absolute: 0 10*3/uL (ref 0.0–0.1)
Basophils Relative: 0.7 % (ref 0.0–3.0)
Eosinophils Absolute: 0.1 10*3/uL (ref 0.0–0.7)
Eosinophils Relative: 2.1 % (ref 0.0–5.0)
HCT: 34.8 % — ABNORMAL LOW (ref 36.0–46.0)
Hemoglobin: 11.1 g/dL — ABNORMAL LOW (ref 12.0–15.0)
Lymphocytes Relative: 15.2 % (ref 12.0–46.0)
Lymphs Abs: 0.9 10*3/uL (ref 0.7–4.0)
MCHC: 31.9 g/dL (ref 30.0–36.0)
MCV: 62.6 fl — ABNORMAL LOW (ref 78.0–100.0)
Monocytes Absolute: 0.4 10*3/uL (ref 0.1–1.0)
Monocytes Relative: 7.3 % (ref 3.0–12.0)
Neutro Abs: 4.5 10*3/uL (ref 1.4–7.7)
Neutrophils Relative %: 74.7 % (ref 43.0–77.0)
Platelets: 228 10*3/uL (ref 150.0–400.0)
RBC: 5.55 Mil/uL — ABNORMAL HIGH (ref 3.87–5.11)
RDW: 15.3 % (ref 11.5–15.5)
WBC: 6 10*3/uL (ref 4.0–10.5)

## 2015-11-02 LAB — NITRIC OXIDE: Nitric Oxide: 6

## 2015-11-02 MED ORDER — PREDNISONE 10 MG PO TABS
ORAL_TABLET | ORAL | Status: DC
Start: 2015-11-02 — End: 2015-11-03

## 2015-11-02 MED ORDER — ACETAMINOPHEN-CODEINE #3 300-30 MG PO TABS: ORAL_TABLET | ORAL | Status: DC

## 2015-11-02 MED ORDER — BISOPROLOL FUMARATE 5 MG PO TABS: 5.0000 mg | ORAL_TABLET | Freq: Every day | ORAL | Status: DC

## 2015-11-02 NOTE — Telephone Encounter (Signed)
Requested a call back from the pt to discuss.

## 2015-11-02 NOTE — Patient Instructions (Addendum)
Please see patient coordinator before you leave today  to schedule sinus CT and we will call results   For drainage / throat tickle try take CHLORPHENIRAMINE  4 mg - take one every 4 hours as needed - available over the counter- may cause drowsiness so start with just a bedtime dose or two and see how you tolerate it before trying in daytime    Please remember to go to the lab  department downstairs for your tests - we will call you with the results when they are available.  Until cough gone 100% with no need for any cough medicine > prilosec or pantoprazole 40 mg proton pump inhibitor and continue pepcid 20 mg at hs  Take delsym two tsp every 12 hours and supplement if needed with tylenol #3  up to 2 every 4 hours to suppress the urge to cough. Swallowing water or using ice chips/non mint and menthol containing candies (such as lifesavers or sugarless jolly ranchers) are also effective.  You should rest your voice and avoid activities that you know make you cough.  Once you have eliminated the cough for 3 straight days try reducing the tylenol #3  first,  then the delsym as tolerated.    Stop toprol (lopressor/metaprolol)  And start bisoprolol 5 mg one daily

## 2015-11-02 NOTE — Telephone Encounter (Signed)
The trulicity is too expensive because she is in the donut hole, it now costs her $200 is there anything we can suggest that is cheaper

## 2015-11-02 NOTE — Telephone Encounter (Signed)
I contacted the pt and advised of note below. Pt wanted to know what the dosage would of the Repaglinide would be. Pt stated she is currently taking 2 tab tid before each meal. Pt wanted to know if taking Januvia would be an option. Please advise, Thanks!

## 2015-11-02 NOTE — Telephone Encounter (Signed)
repaglinide would be 2 mg 3 times a day (just before each meal) i would be happy to rx the [place], but it is expensive, also. Please let us know.

## 2015-11-02 NOTE — Progress Notes (Signed)
Subjective:     Patient ID: Breanna Webster, female   DOB: [birth year]     MRN: [id number]    Brief patient profile:  19 yowf never smoker with pnds as child allergy tested as child doesn't know details, never took shots,  outgrew completely then symptoms resumed in 90's after moved to [place] from [facility] with coughing that improved p NF late 90s but still had drainage varialby severe no pattern as to time of year then much worse since summer 2016 with onset of cough/wheeze May 2017 referred to pulmonary clinic 10/14/2015 by Dr Breanna Webster   History of Present Illness  10/14/2015 1st [facility] office visit/ Breanna [place]   Chief Complaint  Patient presents with  . Pulmonary Consult    Referred by Dr. Betty Webster. Pt c/o cough and wheezing x 5 wks. Cough is occ prod with minimal green sputum.   acute onset cough > green mucus one week p husband's uri May 2017 >  rx with abx/ prednisone improved then worse 2 days p finished but mostly productive  white mucus.  If doesn't take the codeine cough med then coughs worse at hs.  No assoc sob/ some wheeze better with saba  rec The key to effective treatment for your cough is eliminating the non-stop cycle of cough you're stuck in long enough to let your airway heal completely and then see if there is anything still making you cough once you stop the cough suppression, but this should take no more than 5 days to figure out Eliminate all coughing for 3 days with your codiene syrup and then switch delsym 2 tsp every 12 hours  Prednisone 10 mg take  4 each am x 2 days,   2 each am x 2 days,  1 each am x 2 days and stop (this is to eliminate allergies and inflammation from coughing) Protonix (pantoprazole) Take 30-60 min before first meal of the day and Pepcid 20 mg one bedtime plus chlorpheniramine 4 mg x 2 at bedtime (both available over the counter)  until cough is completely gone for at least a week without the need for cough suppression GERD diet    11/02/2015  acute extended ov/Breanna [place] re: cough since may 2017   Chief Complaint  Patient presents with  . Pulmonary Consult    Cough has only improved some. She is still wheezing and her cough is prod with green sputum.    worse in am and at bedtime and slt green mucus again p initially turning white    No obvious day to day or daytime variability or assoc sob  mucus plugs or hemoptysis or cp or chest tightness,  or overt sinus or hb symptoms. No unusual exp hx or h/o childhood pna/ asthma or knowledge of premature birth.  Sleeping ok without nocturnal  or early am exacerbation  of respiratory  c/o's or need for noct saba. Also denies any obvious fluctuation of symptoms with weather or environmental changes or other aggravating or alleviating factors except as outlined above   Current Medications, Allergies, Complete Past Medical History, Past Surgical History, Family History, and Social History were reviewed in [employer] record.  ROS  The following are not active complaints unless bolded sore throat, dysphagia, dental problems, itching, sneezing,  nasal congestion or excess/ purulent secretions, ear ache,   fever, chills, sweats, unintended wt loss, classically pleuritic or exertional cp,  orthopnea pnd or leg swelling, presyncope, palpitations, abdominal pain, anorexia, nausea, vomiting, diarrhea  or change in bowel or bladder habits, change in stools or urine, dysuria,hematuria,  rash, arthralgias, visual complaints, headache, numbness, weakness or ataxia or problems with walking or coordination,  change in mood/affect or memory.               Objective:   Physical Exam    amb wf nad / harsh upper airway cough    11/02/2015       193  10/14/15 192 lb (87.091 kg)  10/10/15 190 lb (86.183 kg)  10/07/15 189 lb 4 oz (85.843 kg)    Vital signs reviewed   HEENT: nl dentition, turbinates, and oropharynx. Nl external ear canals without cough reflex   NECK :  without  JVD/Nodes/TM/ nl carotid upstrokes bilaterally   LUNGS: no acc muscle use,   Cough early insp only and clear to A and P   CV:  RRR  no s3 or murmur or increase in P2, no edema   ABD:  Obese/ soft and nontender with nl inspiratory excursion in the supine position. No bruits or organomegaly, bowel sounds nl  MS:  Nl gait/ ext warm without deformities, calf tenderness, cyanosis or clubbing No obvious joint restrictions   SKIN: warm and dry without lesions    NEURO:  alert, approp, nl sensorium with  no motor deficits         I personally reviewed images and agree with radiology impression as follows:  CXR:  09/27/15  Enlargement of cardiac silhouette with chronic bronchitic changes. No acute infiltrate.  Labs 11/02/2015  Cbc with diff/ allergy profile   Assessment:

## 2015-11-02 NOTE — Telephone Encounter (Signed)
The entire class of meds that trulicity is in is expensive We could double the repaglinide again to offset the stoppage of the trulicity, but they are different drugs.  Why don't we try that?

## 2015-11-03 LAB — RESPIRATORY ALLERGY PROFILE REGION II ~~LOC~~
Allergen, Cedar tree, t12: <0.1 kU/L
Allergen, Comm Silver Birch, t9: <0.1 kU/L
Allergen, Cottonwood, t14: <0.1 kU/L
Allergen, D pternoyssinus,d7: 1.1 kU/L — ABNORMAL HIGH
Allergen, Mouse Urine Protein, e78: <0.1 kU/L
Allergen, Mulberry, t76: <0.1 kU/L
Allergen, Oak,t7: <0.1 kU/L
Alternaria Alternata: <0.1 kU/L
Aspergillus fumigatus, m3: <0.1 kU/L
Bermuda Grass: <0.1 kU/L
Box Elder IgE: <0.1 kU/L
Cat Dander: <0.1 kU/L
Cladosporium Herbarum: <0.1 kU/L
Cockroach: <0.1 kU/L
Common Ragweed: <0.1 kU/L
D. farinae: 0.87 kU/L — ABNORMAL HIGH
Dog Dander: <0.1 kU/L
Elm IgE: <0.1 kU/L
IgE (Immunoglobulin E), Serum: 137 kU/L — ABNORMAL HIGH (ref <115)
Johnson Grass: <0.1 kU/L
Pecan/Hickory Tree IgE: <0.1 kU/L
Penicillium Notatum: <0.1 kU/L
Rough Pigweed  IgE: <0.1 kU/L
Sheep Sorrel IgE: <0.1 kU/L
Timothy Grass: <0.1 kU/L

## 2015-11-03 MED ORDER — REPAGLINIDE 2 MG PO TABS: 2.0000 mg | ORAL_TABLET | Freq: Three times a day (TID) | ORAL | Status: DC

## 2015-11-03 NOTE — Assessment & Plan Note (Addendum)
Cyclical cough rx 9/32/6712 >>> repeat 11/03/2015 as did not follow instructions the first time - FENO 11/02/2015  =   6 - Allergy profile 11/02/2015 >  Eos 0.1 /  IgE  Pending  - Sinus CT 11/02/2015 >>>   Lack of cough resolution on a verified empirical regimen could mean an alternative diagnosis (like asthma which is unlikely with NO so low but can still see related to high doses of lopressor- see hbp)  persistence of the disease state (eg sinusitis or bronchiectasis - so needs sinus ct and perhaps HRCT chest)  , or inadequacy of currently available therapy (eg no medical rx available for non-acid gerd/ did not eliminate cyclical coughing the first time  The standardized cough guidelines published in Chest by Lissa Morales in 2006 are still the best available and consist of a multiple step process (up to 12!) , not a single office visit,  and are intended  to address this problem logically,  with an alogrithm dependent on response to empiric treatment at  each progressive step  to determine a specific diagnosis with  minimal addtional testing needed. Therefore if adherence is an issue or can't be accurately verified,  it's very unlikely the standard evaluation and treatment will be successful here.    Furthermore, response to therapy (other than acute cough suppression, which should only be used short term with avoidance of narcotic containing cough syrups if possible), can be a gradual process for which the patient is not likely to  perceive immediate benefit.  Unlike going to an eye doctor where the best perscription is almost always the first one and is immediately effective, this is almost never the case in the management of chronic cough syndromes. Therefore the patient needs to commit up front to consistently adhere to recommendations  for up to 6 weeks of therapy directed at the likely underlying problem(s) before the response can be reasonably evaluated.   rec rechallenge with high dose cough  suppression with codeine/ eval for sinus dz and regroup  I had an extended discussion with the patient reviewing all relevant studies completed to date and  lasting 25 minutes of a 40  minute acute extended ov directed at refractory symptoms  Each maintenance medication was reviewed in detail including most importantly the difference between maintenance and prns and under what circumstances the prns are to be triggered using an action plan format that is not reflected in the computer generated alphabetically organized AVS.    Please see instructions for details which were reviewed in writing and the patient given a copy highlighting the part that I personally wrote and discussed at today's ov.

## 2015-11-03 NOTE — Telephone Encounter (Signed)
Ok, I have sent a prescription to your pharmacy

## 2015-11-03 NOTE — Telephone Encounter (Signed)
Pt stated she would like to increase the repaglinide 2 mg tid.

## 2015-11-03 NOTE — Progress Notes (Signed)
Quick Note:  lmtcb ______ 

## 2015-11-03 NOTE — Assessment & Plan Note (Signed)
Try bisoprolol instead of lopressor 11/02/2015 due to ? Asthma   rx bisoprolol 5 mg daily trial basis

## 2015-11-03 NOTE — Telephone Encounter (Deleted)
Pt wanted to clarify what the dosage of the repaglinide would be since she is already taking 2

## 2015-11-03 NOTE — Telephone Encounter (Signed)
I contacted the pt and advised of note below via vm. Requested a call back if the pt would like to go along with the dosage increase for the repaglinide.

## 2015-11-04 ENCOUNTER — Telehealth: Payer: Self-pay | Admitting: Internal Medicine

## 2015-11-04 NOTE — Telephone Encounter (Signed)
Patient calling to get lab results.\ Patient notified.  No questions or concerns at this time. Nothing further needed.

## 2015-11-07 ENCOUNTER — Telehealth: Payer: Self-pay

## 2015-11-07 NOTE — Telephone Encounter (Signed)
I have seen her for acute visit and follow up on acute visit. I have not followed on this problems. Escitalopram 20 mg to continue once daily can be sent to pharmacy, #90/0, she needs to follow before she needs another refill.  Thanks, BJ

## 2015-11-08 MED ORDER — ESCITALOPRAM OXALATE 20 MG PO TABS: 20.0000 mg | ORAL_TABLET | Freq: Every day | ORAL | Status: DC

## 2015-11-08 NOTE — Telephone Encounter (Signed)
Rx sent to pharmacy with note saying appt needed for further refills.

## 2015-11-09 ENCOUNTER — Ambulatory Visit (INDEPENDENT_AMBULATORY_CARE_PROVIDER_SITE_OTHER)
Admission: RE | Admit: 2015-11-09 | Discharge: 2015-11-09 | Disposition: A | Discharge status: Home / Self Care | Payer: Medicare Other | Source: Ambulatory Visit | Attending: Internal Medicine | Admitting: Internal Medicine

## 2015-11-09 DIAGNOSIS — R05 Cough: Secondary | ICD-10-CM | POA: Diagnosis not present

## 2015-11-09 DIAGNOSIS — R058 Other specified cough: Secondary | ICD-10-CM

## 2015-11-09 DIAGNOSIS — J3489 Other specified disorders of nose and nasal sinuses: Secondary | ICD-10-CM | POA: Diagnosis not present

## 2015-11-10 ENCOUNTER — Institutional Professional Consult (permissible substitution): Payer: Medicare Other | Admitting: Internal Medicine

## 2015-12-05 NOTE — Progress Notes (Signed)
Cardiology Office Note    Date:  12/06/2015   ID:  Breanna Webster, DOB 1946-02-14, MRN [id number]  PCP:  Betty [name], MD  Cardiologist: Sinclair Grooms, MD   Chief Complaint  Patient presents with  . Follow-up    LBBB    History of Present Illness:  Breanna Webster is a 70 y.o. female referred from primary care because of "enlarged heart" and heart murmur.  The patient is adopted. She does not have a history of rheumatic fever that she is aware of. She is referred by Dr. [name] for evaluation of cardiac enlargement and heart murmur. I saw the patient greater than 15 years ago for exertional dyspnea and cardiac workup at that time was negative. It included an abnormal myocardial perfusion study that led to coronary angiography which did not reveal significant obstructive disease.  Over the past 10 years she has a diagnosis of diabetes mellitus. She has always had exertional dyspnea. She denies chest pain. This year she has been concerned with upper respiratory complaints. In reviewing the chart she has left bundle branch block. This was not present 15 years ago. She is unaware of this diagnosis. She denies chest pain, orthopnea, and PND. She has intermittent lower extremity swelling. She has not had syncope or prolonged palpitations.    Past Medical History:  Diagnosis Date  . Arthritis   . Carotid artery stenosis    Mild  . Colon polyps   . Diabetes mellitus   . Diverticulitis   . Diverticulosis   . GERD (gastroesophageal reflux disease)   . Grave's disease   . Hyperlipidemia   . Hypertension   . IBS (irritable bowel syndrome)   . Osteopenia   . Thalassemia minor     Past Surgical History:  Procedure Laterality Date  . CHOLECYSTECTOMY    . HIATAL HERNIA REPAIR    . HYSTEROSCOPY     fibroids  . laparoscopy     fibroids    Current Medications: Outpatient Medications Prior to Visit  Medication Sig Dispense Refill  . aspirin 81 MG tablet Take 81 mg by mouth  daily.      . bisoprolol (ZEBETA) 5 MG tablet Take 1 tablet (5 mg total) by mouth daily. 30 tablet 11  . bromocriptine (PARLODEL) 2.5 MG tablet 1/4 tab daily 8 tablet 11  . escitalopram (LEXAPRO) 20 MG tablet Take 1 tablet (20 mg total) by mouth daily. 90 tablet 0  . hydrochlorothiazide (HYDRODIURIL) 12.5 MG tablet Take 1 tablet (12.5 mg total) by mouth daily. 90 tablet 1  . Multiple Vitamin (MULITIVITAMIN WITH MINERALS) TABS Take 1 tablet by mouth daily.    . nortriptyline (PAMELOR) 25 MG capsule Take 1 capsule (25 mg total) by mouth at bedtime. 90 capsule 1  . pantoprazole (PROTONIX) 40 MG tablet Take 1 tablet (40 mg total) by mouth daily. Take 30-60 min before first meal of the day 30 tablet 2  . repaglinide (PRANDIN) 2 MG tablet Take 1 tablet (2 mg total) by mouth 3 (three) times daily before meals. 90 tablet 11  . SYNTHROID 150 MCG tablet Take 1 tablet (150 mcg total) by mouth daily. 90 tablet 3  . valsartan (DIOVAN) 160 MG tablet take 1 tablet by mouth once daily 90 tablet 2  . acetaminophen-codeine (TYLENOL #3) 300-30 MG tablet One every 4 hours as needed for cough 40 tablet 0  . albuterol (PROVENTIL HFA;VENTOLIN HFA) 108 (90 Base) MCG/ACT inhaler Inhale 2 puffs into the lungs  every 6 (six) hours as needed for wheezing or shortness of breath. 1 Inhaler 0  . Dulaglutide (TRULICITY) 1.5 IF/0.2DX SOPN Inject 1.5 mg into the skin once a week. 4 pen 11   No facility-administered medications prior to visit.      Allergies:   Statins   Social History   Social History  . Marital status: Married    Spouse name: N/A  . Number of children: 2  . Years of education: N/A   Occupational History  . fundraising    Social History Main Topics  . Smoking status: Never Smoker  . Smokeless tobacco: Never Used  . Alcohol use No  . Drug use: No  . Sexual activity: Not Asked   Other Topics Concern  . None   Social History Narrative   Lives with husband in a one story home.     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   Education: college.     Family History:  The patient's family history includes Healthy in her son. She was adopted.   ROS:   Please see the history of present illness.    Shortness of breath on exertion but no orthopnea. Back pain, muscle pain.  All other systems reviewed and are negative.   PHYSICAL EXAM:   VS:  BP 116/72   Pulse 65   Ht 5' 5"  (1.651 m)   Wt 192 lb 1.9 oz (87.1 kg)   SpO2 96%   BMI 31.97 kg/m    GEN: Well nourished, well developed, in no acute distress  HEENT: normal  Neck: no JVD, carotid bruits, or masses Cardiac: RRR; 3/6 systolic murmur right upper sternal border with radiation to the neck. 2/6 holosystolic murmur left lower sternal border. The murmur is not heard into the axilla. No diastolic murmurs, rubs, or gallops,no edema  Respiratory:  clear to auscultation bilaterally, normal work of breathing GI: soft, nontender, nondistended, + BS MS: no deformity or atrophy  Skin: warm and dry, no rash Neuro:  Alert and Oriented x 3, Strength and sensation are intact Psych: euthymic mood, full affect  Wt Readings from Last 3 Encounters:  12/06/15 192 lb 1.9 oz (87.1 kg)  11/02/15 193 lb (87.5 kg)  10/14/15 192 lb (87.1 kg)      Studies/Labs Reviewed:   EKG:  EKG  Normal sinus rhythm, left bundle-branch block, QRS duration 160 ms.  Recent Labs: 10/07/2015: ALT 75; BUN 21; Creatinine, Ser 0.77; Potassium 4.2; Sodium 134; TSH 1.89 11/02/2015: Hemoglobin 11.1; Platelets 228.0   Lipid Panel    Component Value Date/Time   CHOL 161 03/29/2015 1102   TRIG 115.0 03/29/2015 1102   HDL 39.00 (L) 03/29/2015 1102   CHOLHDL 4 03/29/2015 1102   VLDL 23.0 03/29/2015 1102   LDLCALC 99 03/29/2015 1102   LDLDIRECT 98.0 05/24/2014 1343    Additional studies/ records that were reviewed today include:  Echocardiogram 2/172016:  Study Conclusions  - Left  ventricle: The cavity size was normal. Systolic function was normal. The estimated ejection fraction was in the range of 55% to 60%. Wall motion was normal; there were no regional wall motion abnormalities. There was an increased relative contribution of atrial contraction to ventricular filling. Doppler parameters are consistent with abnormal left ventricular relaxation (grade 1 diastolic dysfunction). Doppler parameters are consistent with elevated ventricular end-diastolic filling pressure. - Ventricular septum: Septal motion showed paradox. These changes are consistent with intraventricular conduction  delay. - Aortic valve: Poorly visualized. Moderately calcified annulus. Trileaflet. Mild calcification. There was very mild stenosis. - Mitral valve: Heavy mitral annular calcification of the posterior mitral valve annulus. The findings are consistent with mild stenosis. - Left atrium: The atrium was moderately dilated.  ASSESSMENT:    1. Chronic diastolic congestive heart failure ([place])   2. Essential hypertension, benign   3. Dyslipidemia   4. Carotid artery stenosis, unspecified laterality   5. Dyspnea   6. Left bundle branch block      PLAN:  In order of problems listed above:  1. Based upon prior echo done a year and a half ago. Systolic function at that time was normal. Dimensions were normal. Chest x-rays demonstrated cardiomegaly. Echocardiogram will be done to rule out progression of systolic dysfunction. 2. Low-salt diet is recommended. Blood pressure is well-controlled on this evaluation. 3. Followed by primary care 4. Followed by primary care 5. Pulmonary versus cardiac. BNP will be obtained. 6. Encounter healthcare outside our system.    Medication Adjustments/Labs and Tests Ordered: Current medicines are reviewed at length with the patient today.  Concerns regarding medicines are outlined above.  Medication changes, Labs and Tests  ordered today are listed in the Patient Instructions below. Patient Instructions  Medication Instructions:   NO CHANGE  Labwork:  Your physician recommends that you HAVE LAB WORK TODAY  Testing/Procedures:  Your physician has requested that you have an echocardiogram. Echocardiography is a painless test that uses sound waves to create images of your heart. It provides your doctor with information about the size and shape of your heart and how well your heart's chambers and valves are working. This procedure takes approximately one hour. There are no restrictions for this procedure.    Follow-Up:  Your physician wants you to follow-up in: [place] will receive a reminder letter in the mail two months in advance. If you don't receive a letter, please call our office to schedule the follow-up appointment.   If you need a refill on your cardiac medications before your next appointment, please call your pharmacy.       Signed, Sinclair Grooms, MD  12/06/2015 9:29 AM    [facility] [place], [place]  [address] Phone: [phone]; Fax: [phone]

## 2015-12-06 ENCOUNTER — Encounter: Payer: Self-pay | Admitting: Interventional Cardiology

## 2015-12-06 ENCOUNTER — Ambulatory Visit (INDEPENDENT_AMBULATORY_CARE_PROVIDER_SITE_OTHER): Payer: Medicare Other | Admitting: Interventional Cardiology

## 2015-12-06 VITALS — BP 116/72 | HR 65 | Ht 65.0 in | Wt 192.1 lb

## 2015-12-06 DIAGNOSIS — I10 Essential (primary) hypertension: Secondary | ICD-10-CM | POA: Diagnosis not present

## 2015-12-06 DIAGNOSIS — E785 Hyperlipidemia, unspecified: Secondary | ICD-10-CM

## 2015-12-06 DIAGNOSIS — I447 Left bundle-branch block, unspecified: Secondary | ICD-10-CM

## 2015-12-06 DIAGNOSIS — I6529 Occlusion and stenosis of unspecified carotid artery: Secondary | ICD-10-CM | POA: Diagnosis not present

## 2015-12-06 DIAGNOSIS — I5032 Chronic diastolic (congestive) heart failure: Secondary | ICD-10-CM | POA: Diagnosis not present

## 2015-12-06 DIAGNOSIS — R0600 Dyspnea, unspecified: Secondary | ICD-10-CM

## 2015-12-06 LAB — BRAIN NATRIURETIC PEPTIDE: Brain Natriuretic Peptide: 77.1 pg/mL (ref <100)

## 2015-12-06 NOTE — Patient Instructions (Signed)
Medication Instructions:   NO CHANGE  Labwork:  Your physician recommends that you HAVE LAB WORK TODAY  Testing/Procedures:  Your physician has requested that you have an echocardiogram. Echocardiography is a painless test that uses sound waves to create images of your heart. It provides your doctor with information about the size and shape of your heart and how well your heart's chambers and valves are working. This procedure takes approximately one hour. There are no restrictions for this procedure.    Follow-Up:  Your physician wants you to follow-up in: Breanna Webster will receive a reminder letter in the mail two months in advance. If you don't receive a letter, please call our office to schedule the follow-up appointment.   If you need a refill on your cardiac medications before your next appointment, please call your pharmacy.

## 2015-12-07 ENCOUNTER — Ambulatory Visit (INDEPENDENT_AMBULATORY_CARE_PROVIDER_SITE_OTHER): Payer: Medicare Other | Admitting: Endocrinology

## 2015-12-07 ENCOUNTER — Encounter: Payer: Self-pay | Admitting: Endocrinology

## 2015-12-07 VITALS — BP 108/62 | HR 69 | Temp 97.9°F | Ht 65.0 in | Wt 193.0 lb

## 2015-12-07 DIAGNOSIS — E119 Type 2 diabetes mellitus without complications: Secondary | ICD-10-CM

## 2015-12-07 DIAGNOSIS — I6529 Occlusion and stenosis of unspecified carotid artery: Secondary | ICD-10-CM | POA: Diagnosis not present

## 2015-12-07 LAB — POCT GLYCOSYLATED HEMOGLOBIN (HGB A1C): Hemoglobin A1C: 7.6

## 2015-12-07 MED ORDER — GLIMEPIRIDE 4 MG PO TABS: 4.0000 mg | ORAL_TABLET | Freq: Every day | ORAL | 3 refills | Status: DC

## 2015-12-07 NOTE — Patient Instructions (Addendum)
I have sent a prescription to your pharmacy, to change repaglinide to glyburide Please continue the same bromocriptine.     check your blood sugar once a day.  vary the time of day when you check, between before the 3 meals, and at bedtime.  also check if you have symptoms of your blood sugar being too high or too low.  please keep a record of the readings and bring it to your next appointment here.  You can write it on any piece of paper.  please call us sooner if your blood sugar goes below 70, or if you have a lot of readings over 200.   Please come back for a follow-up appointment in 2 months.

## 2015-12-07 NOTE — Progress Notes (Signed)
Subjective:    Patient ID: Breanna Webster, female    DOB: [birth year], 70 y.o.   MRN: [id number]  HPI Pt returns for f/u of diabetes mellitus: DM type: 2.  Dx'ed: 2003.  Complications: none.   Therapy: 2 oral meds.    GDM: never.   DKA: never.  Severe hypoglycemia: never.  Pancreatitis: never.  Other: she has never been on insulin; she stopped metformin due to nausea and diarrhea; pioglitizone is precluded by edema.  She stopped trulicity, due to cost.  She declines [place], due to cost.   Interval history: She takes meds as rx'ed.  She says the repaglinide is causing myalgias.  She does not check cbg's.   Past Medical History:  Diagnosis Date  . Arthritis   . Carotid artery stenosis    Mild  . Colon polyps   . Diabetes mellitus   . Diverticulitis   . Diverticulosis   . GERD (gastroesophageal reflux disease)   . Grave's disease   . Hyperlipidemia   . Hypertension   . IBS (irritable bowel syndrome)   . Osteopenia   . Thalassemia minor     Past Surgical History:  Procedure Laterality Date  . CHOLECYSTECTOMY    . HIATAL HERNIA REPAIR    . HYSTEROSCOPY     fibroids  . laparoscopy     fibroids    Social History   Social History  . Marital status: Married    Spouse name: N/A  . Number of children: 2  . Years of education: N/A   Occupational History  . fundraising    Social History Main Topics  . Smoking status: Never Smoker  . Smokeless tobacco: Never Used  . Alcohol use No  . Drug use: No  . Sexual activity: Not on file   Other Topics Concern  . Not on file   Social History Narrative   Lives with husband in a one story home.     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   Education: college.    Current Outpatient Prescriptions on File Prior to Visit  Medication Sig Dispense Refill  . aspirin 81 MG tablet Take 81 mg by mouth daily.      . bisoprolol (ZEBETA) 5 MG tablet Take 1  tablet (5 mg total) by mouth daily. 30 tablet 11  . bromocriptine (PARLODEL) 2.5 MG tablet 1/4 tab daily 8 tablet 11  . escitalopram (LEXAPRO) 20 MG tablet Take 1 tablet (20 mg total) by mouth daily. 90 tablet 0  . hydrochlorothiazide (HYDRODIURIL) 12.5 MG tablet Take 1 tablet (12.5 mg total) by mouth daily. 90 tablet 1  . Multiple Vitamin (MULITIVITAMIN WITH MINERALS) TABS Take 1 tablet by mouth daily.    . nortriptyline (PAMELOR) 25 MG capsule Take 1 capsule (25 mg total) by mouth at bedtime. 90 capsule 1  . pantoprazole (PROTONIX) 40 MG tablet Take 1 tablet (40 mg total) by mouth daily. Take 30-60 min before first meal of the day 30 tablet 2  . SYNTHROID 150 MCG tablet Take 1 tablet (150 mcg total) by mouth daily. 90 tablet 3  . valsartan (DIOVAN) 160 MG tablet take 1 tablet by mouth once daily 90 tablet 2   No current facility-administered medications on file prior to visit.     Allergies  Allergen Reactions  . Statins Other (See Comments)    Muscle aches    Family History  Problem Relation Age  of Onset  . Adopted: Yes  . Healthy Son     x 2  . Headache      Cluster headaches  . Colon cancer Neg Hx   . Pancreatic cancer Neg Hx   . Rectal cancer Neg Hx   . Stomach cancer Neg Hx     BP 108/62 (BP Location: Left Arm, Patient Position: Sitting, Cuff Size: Normal)   Pulse 69   Temp 97.9 F (36.6 C) (Oral)   Ht 5' 5"  (1.651 m)   Wt 193 lb (87.5 kg)   SpO2 95%   BMI 32.12 kg/m   Review of Systems She denies hypoglycemia sxs.      Objective:   Physical Exam VITAL SIGNS:  See vs page GENERAL: no distress Pulses: dorsalis pedis intact bilat.   MSK: no deformity of the feet CV: trace bilat leg edema.   Skin:  no ulcer on the feet.  normal color and temp on the feet. Neuro: sensation is intact to touch on the feet.      A1c=7.6%    Assessment & Plan:  Type 2 DM: this is the best control this pt should aim for, given this regimen, which does match insulin to her  changing needs throughout the day.  Myalgias, new, perceived due to repaglinide.  Edema: this limits oral DM rx options.

## 2015-12-08 ENCOUNTER — Telehealth: Payer: Self-pay | Admitting: *Deleted

## 2015-12-08 NOTE — Telephone Encounter (Signed)
Spoke with patient's husband. He gave me a cell number to call patient [phone]. Left a message for patient to call back at cell number.

## 2015-12-08 NOTE — Telephone Encounter (Signed)
-----   Message from Hulan Saas, RN sent at 07/15/2015 11:39 AM EDT ----- Call and schedule patient for #3 twinrix on 12/14/15. SA

## 2015-12-12 NOTE — Telephone Encounter (Signed)
Patient returned phone call. Best # [phone]

## 2015-12-12 NOTE — Telephone Encounter (Signed)
Patient is scheduled for Twinrix #3 on 12/27/15 11:00

## 2015-12-13 ENCOUNTER — Encounter: Payer: Self-pay | Admitting: Internal Medicine

## 2015-12-13 ENCOUNTER — Ambulatory Visit (INDEPENDENT_AMBULATORY_CARE_PROVIDER_SITE_OTHER): Payer: Medicare Other | Admitting: Internal Medicine

## 2015-12-13 VITALS — BP 122/80 | HR 69 | Ht 65.0 in | Wt 192.6 lb

## 2015-12-13 DIAGNOSIS — I6529 Occlusion and stenosis of unspecified carotid artery: Secondary | ICD-10-CM | POA: Diagnosis not present

## 2015-12-13 DIAGNOSIS — R05 Cough: Secondary | ICD-10-CM

## 2015-12-13 DIAGNOSIS — R058 Other specified cough: Secondary | ICD-10-CM

## 2015-12-13 DIAGNOSIS — I10 Essential (primary) hypertension: Secondary | ICD-10-CM

## 2015-12-13 NOTE — Patient Instructions (Signed)
Remember at the onset of any respiratory complaint :  Try prilosec otc 56m  Take 30-60 min before first meal of the day and Pepcid ac (famotidine) 20 mg one @  bedtime until completely better for a week  For drainage / throat tickle try take CHLORPHENIRAMINE  4 mg - take one every 4 hours as needed - available over the counter- may cause drowsiness so start with just a bedtime dose or two and see how you tolerate it before trying in daytime     If you are satisfied with your treatment plan,  let your doctor know and he/she can either refill your medications or you can return here when your prescription runs out.     If in any way you are not 100% satisfied,  please tell [place]  If 100% better, tell your friends!  Pulmonary follow up is as needed

## 2015-12-13 NOTE — Telephone Encounter (Signed)
Patient is aware of the appointment.

## 2015-12-13 NOTE — Assessment & Plan Note (Signed)
Try bisoprolol instead of lopressor 11/02/2015 due to ? Asthma > all symptoms resolved 12/13/2015   Adequate control on present rx, reviewed > no change in rx needed  > would leave on bisoprolol just to avoid muddying the water in event of future flare of coughing   Follow up per Primary Care planned

## 2015-12-13 NOTE — Assessment & Plan Note (Signed)
Cyclical cough rx 05/22/2907 >>> repeat 11/03/2015 as did not follow instructions the first time - FENO 11/02/2015  =   6 - Allergy profile 11/02/2015 >  Eos 0.1 /  IgE  137 dust only  - Sinus CT 11/09/2015 > Mucosal thickening inferior right maxillary antrum. Visualized paranasal sinuses elsewhere clear. - 12/13/2015 all better off all meds p cyclical cough rx/ gerd rx   I had an extended final summary discussion with the patient reviewing all relevant studies completed to date and  lasting 15 to 20 minutes of a 25 minute visit on the following issues:   Explained: Of the three most common causes of chronic cough, only one (GERD)  can actually cause the other two (asthma and post nasal drip syndrome)  and perpetuate the cylce of cough inducing airway trauma, inflammation, heightened sensitivity to reflux which is prompted by the cough itself via a cyclical mechanism.    This may partially respond to steroids and look like asthma and post nasal drainage but never erradicated completely unless the cough and the secondary reflux are eliminated, preferably both at the same time.  While not intuitively obvious, many patients with chronic low grade reflux do not cough until there is a secondary insult that disturbs the protective epithelial barrier and exposes sensitive nerve endings.  This can be viral or direct physical injury such as with an endotracheal tube.   The point is that once this occurs, it is difficult to eliminate using anything but a maximally effective acid suppression regimen at least in the short run, accompanied by an appropriate diet to address non acid GERD.   Each maintenance medication was reviewed in detail including most importantly the difference between maintenance and as needed and under what circumstances the prns are to be used.  Please see instructions for details which were reviewed in writing and the patient given a copy.    Pulmonary f/u is prn

## 2015-12-13 NOTE — Progress Notes (Signed)
Subjective:     Patient ID: Breanna Webster, female   DOB: 1945-10-22     MRN: [id number]    Brief patient profile:  65 yowf never smoker with pnds as child allergy tested as child doesn't know details, never took shots,  outgrew completely then symptoms resumed in 90's after moved to [place] from [facility] with coughing that improved p NF late 90s but still had drainage varialby severe no pattern as to time of year then much worse since summer 2016 with onset of cough/wheeze May 2017 referred to pulmonary clinic 10/14/2015 by Dr Betty [name].   History of Present Illness  10/14/2015 1st [facility] office visit/ Jaivon Vanbeek   Chief Complaint  Patient presents with  . Pulmonary Consult    Referred by Dr. Betty [name]. Pt c/o cough and wheezing x 5 wks. Cough is occ prod with minimal green sputum.   acute onset cough > green mucus one week p husband's uri May 2017 >  rx with abx/ prednisone improved then worse 2 days p finished but mostly productive  white mucus.  If doesn't take the codeine cough med then coughs worse at hs.  No assoc sob/ some wheeze better with saba  rec The key to effective treatment for your cough is eliminating the non-stop cycle of cough you're stuck in long enough to let your airway heal completely and then see if there is anything still making you cough once you stop the cough suppression, but this should take no more than 5 days to figure out Eliminate all coughing for 3 days with your codiene syrup and then switch delsym 2 tsp every 12 hours  Prednisone 10 mg take  4 each am x 2 days,   2 each am x 2 days,  1 each am x 2 days and stop (this is to eliminate allergies and inflammation from coughing) Protonix (pantoprazole) Take 30-60 min before first meal of the day and Pepcid 20 mg one bedtime plus chlorpheniramine 4 mg x 2 at bedtime (both available over the counter)  until cough is completely gone for at least a week without the need for cough suppression GERD diet    11/02/2015  acute extended ov/Calder Oblinger re: cough since may 2017   Chief Complaint  Patient presents with  . Pulmonary Consult    Cough has only improved some. She is still wheezing and her cough is prod with green sputum.    worse in am and at bedtime and slt green mucus again p initially turning white  rec Please see patient coordinator before you leave today  to schedule sinus CT and we will call results  For drainage / throat tickle try take CHLORPHENIRAMINE  4 mg - take one every 4 hours as needed -  Please remember to go to the lab  department downstairs for your tests - we will call you with the results when they are available. Until cough gone 100% with no need for any cough medicine > prilosec or pantoprazole 40 mg proton pump inhibitor and continue pepcid 20 mg at hs Take delsym two tsp every 12 hours and supplement if needed with tylenol #3  up to 2 every 4 hours to suppress the urge to cough. Swallowing water or using ice chips/non mint and menthol containing candies (such as lifesavers or sugarless jolly ranchers) are also effective.  You should rest your voice and avoid activities that you know make you cough. Once you have eliminated the cough for 3 straight  days try reducing the tylenol #3  first,  then the delsym as tolerated.   Stop toprol (lopressor/metaprolol)  And start bisoprolol 5 mg one daily    12/13/2015  f/u ov/Lasundra Hascall re: cyclical cough/ gerd Chief Complaint  Patient presents with  . Follow-up    Cough has resolved completely and no more wheezing for the past month. No new co's.    no cough at all/ on no gerd meds/ on bisoprolol instead of metaprolol and doing great    No sob at all, no mucus plugs or hemoptysis or cp or chest tightness,  or overt sinus or hb symptoms. No unusual exp hx or h/o childhood pna/ asthma or knowledge of premature birth.  Sleeping ok without nocturnal  or early am exacerbation  of respiratory  c/o's or need for noct saba. Also denies any obvious fluctuation  of symptoms with weather or environmental changes or other aggravating or alleviating factors except as outlined above   Current Medications, Allergies, Complete Past Medical History, Past Surgical History, Family History, and Social History were reviewed in [employer] record.  ROS  The following are not active complaints unless bolded sore throat, dysphagia, dental problems, itching, sneezing,  nasal congestion or excess/ purulent secretions, ear ache,   fever, chills, sweats, unintended wt loss, classically pleuritic or exertional cp,  orthopnea pnd or leg swelling, presyncope, palpitations, abdominal pain, anorexia, nausea, vomiting, diarrhea  or change in bowel or bladder habits, change in stools or urine, dysuria,hematuria,  rash, arthralgias, visual complaints, headache, numbness, weakness or ataxia or problems with walking or coordination,  change in mood/affect or memory.               Objective:   Physical Exam    amb wf nad    12/13/2015       193   11/02/2015       193  10/14/15 192 lb (87.091 kg)  10/10/15 190 lb (86.183 kg)  10/07/15 189 lb 4 oz (85.843 kg)    Vital signs reviewed   HEENT: nl dentition, turbinates, and oropharynx. Nl external ear canals without cough reflex   NECK :  without JVD/Nodes/TM/ nl carotid upstrokes bilaterally   LUNGS: no acc muscle use,   Clear to  A and P with no cough on insp  CV:  RRR  no s3 or murmur or increase in P2, no edema   ABD:  Obese/ soft and nontender with nl inspiratory excursion in the supine position. No bruits or organomegaly, bowel sounds nl  MS:  Nl gait/ ext warm without deformities, calf tenderness, cyanosis or clubbing No obvious joint restrictions   SKIN: warm and dry without lesions    NEURO:  alert, approp, nl sensorium with  no motor deficits          I personally reviewed images and agree with radiology impression as follows:  CXR:  09/27/15  Enlargement of cardiac  silhouette with chronic bronchitic changes. No acute infiltrate.     Assessment:

## 2015-12-14 ENCOUNTER — Other Ambulatory Visit: Payer: Self-pay

## 2015-12-14 ENCOUNTER — Ambulatory Visit (HOSPITAL_COMMUNITY): Payer: Medicare Other | Attending: Cardiology

## 2015-12-14 DIAGNOSIS — E669 Obesity, unspecified: Secondary | ICD-10-CM | POA: Diagnosis not present

## 2015-12-14 DIAGNOSIS — I352 Nonrheumatic aortic (valve) stenosis with insufficiency: Secondary | ICD-10-CM | POA: Diagnosis not present

## 2015-12-14 DIAGNOSIS — R0600 Dyspnea, unspecified: Secondary | ICD-10-CM | POA: Diagnosis not present

## 2015-12-14 DIAGNOSIS — I119 Hypertensive heart disease without heart failure: Secondary | ICD-10-CM | POA: Diagnosis not present

## 2015-12-14 DIAGNOSIS — E785 Hyperlipidemia, unspecified: Secondary | ICD-10-CM | POA: Insufficient documentation

## 2015-12-14 DIAGNOSIS — I059 Rheumatic mitral valve disease, unspecified: Secondary | ICD-10-CM | POA: Diagnosis not present

## 2015-12-14 DIAGNOSIS — Z6832 Body mass index (BMI) 32.0-32.9, adult: Secondary | ICD-10-CM | POA: Diagnosis not present

## 2015-12-16 ENCOUNTER — Other Ambulatory Visit: Payer: Self-pay | Admitting: Sports Medicine

## 2015-12-16 DIAGNOSIS — M545 Low back pain: Secondary | ICD-10-CM

## 2015-12-16 DIAGNOSIS — M5442 Lumbago with sciatica, left side: Secondary | ICD-10-CM | POA: Diagnosis not present

## 2015-12-16 DIAGNOSIS — M5441 Lumbago with sciatica, right side: Secondary | ICD-10-CM | POA: Diagnosis not present

## 2015-12-20 ENCOUNTER — Telehealth: Payer: Self-pay | Admitting: Interventional Cardiology

## 2015-12-20 DIAGNOSIS — I350 Nonrheumatic aortic (valve) stenosis: Secondary | ICD-10-CM | POA: Insufficient documentation

## 2015-12-20 NOTE — Telephone Encounter (Signed)
Notified pt, per Dr. Tamala Julian, echo showed mild aortic stenosis and recommends repeat echo in 2 years.  Informed pt that I will place order in the system and send a message to our [facility] scheduler to call pt back to arrange this for 2 years out. Pt verbalized understanding.

## 2015-12-20 NOTE — Telephone Encounter (Signed)
Notes Recorded by [name], MD on 12/18/2015 at 7:06 PM EDT There is mild aortic stenosis which needs f/u over time. Repeat echo in ~ 2 hours.  Pt calling for echo results. Will route this msg to Dr. Tamala Julian to clarify when he would like the repeat echo to be.  Will follow-up with pt accordingly.

## 2015-12-20 NOTE — Telephone Encounter (Signed)
New message ° ° ° ° ° °Returning a call to the nurse to get echo results °

## 2015-12-20 NOTE — Telephone Encounter (Signed)
2 years

## 2015-12-27 ENCOUNTER — Ambulatory Visit (INDEPENDENT_AMBULATORY_CARE_PROVIDER_SITE_OTHER): Payer: Medicare Other | Admitting: Gastroenterology

## 2015-12-27 ENCOUNTER — Other Ambulatory Visit: Payer: Self-pay | Admitting: Internal Medicine

## 2015-12-27 DIAGNOSIS — Z23 Encounter for immunization: Secondary | ICD-10-CM

## 2015-12-28 ENCOUNTER — Other Ambulatory Visit: Payer: Self-pay | Admitting: Family Medicine

## 2015-12-28 ENCOUNTER — Ambulatory Visit
Admission: RE | Admit: 2015-12-28 | Discharge: 2015-12-28 | Disposition: A | Discharge status: Home / Self Care | Payer: Medicare Other | Source: Ambulatory Visit | Attending: Sports Medicine | Admitting: Sports Medicine

## 2015-12-28 DIAGNOSIS — M545 Low back pain: Secondary | ICD-10-CM

## 2015-12-28 DIAGNOSIS — M5126 Other intervertebral disc displacement, lumbar region: Secondary | ICD-10-CM | POA: Diagnosis not present

## 2015-12-28 NOTE — Telephone Encounter (Signed)
Rx refill sent to pharmacy. 

## 2015-12-28 NOTE — Telephone Encounter (Signed)
[name] pts PCP is Dr. Betty [name] in [facility]

## 2015-12-28 NOTE — Telephone Encounter (Signed)
lmom for pt to call back to get established with another provider.

## 2015-12-29 NOTE — Telephone Encounter (Signed)
Follow up  Pt voiced someone called her but there are no encounters for this pts conversation today.  Pt voiced whatever the call was about if there's no answer to leave it on her vm.  Please follow up with pt. Thanks!

## 2015-12-29 NOTE — Telephone Encounter (Signed)
Spoke with pt and advised that I do not see where anyone has called since 8/28 and 8/29.  Pt stated that she thought it might be from then because her phone has been off on the dates of her messages.  Advised pt if she has any problems to please feel free to contact our office.  Pt appreciative for call back.

## 2016-01-02 DIAGNOSIS — M5441 Lumbago with sciatica, right side: Secondary | ICD-10-CM | POA: Diagnosis not present

## 2016-01-03 ENCOUNTER — Other Ambulatory Visit: Payer: Self-pay | Admitting: Orthopedic Surgery

## 2016-01-03 DIAGNOSIS — M5416 Radiculopathy, lumbar region: Secondary | ICD-10-CM

## 2016-01-10 ENCOUNTER — Ambulatory Visit
Admission: RE | Admit: 2016-01-10 | Discharge: 2016-01-10 | Disposition: A | Discharge status: Home / Self Care | Payer: Medicare Other | Source: Ambulatory Visit | Attending: Orthopedic Surgery | Admitting: Orthopedic Surgery

## 2016-01-10 DIAGNOSIS — M545 Low back pain: Secondary | ICD-10-CM | POA: Diagnosis not present

## 2016-01-10 DIAGNOSIS — M5416 Radiculopathy, lumbar region: Secondary | ICD-10-CM

## 2016-01-10 MED ORDER — METHYLPREDNISOLONE ACETATE 40 MG/ML INJ SUSP (RADIOLOG
120.0000 mg | Freq: Once | INTRAMUSCULAR | Status: AC
  Administered 2016-01-10: 120 mg via EPIDURAL

## 2016-01-10 MED ORDER — IOPAMIDOL (ISOVUE-M 200) INJECTION 41%
1.0000 mL | Freq: Once | INTRAMUSCULAR | Status: AC
  Administered 2016-01-10: 1 mL via EPIDURAL

## 2016-01-10 NOTE — Discharge Instructions (Signed)

## 2016-01-30 ENCOUNTER — Other Ambulatory Visit: Payer: Self-pay | Admitting: Internal Medicine

## 2016-02-02 ENCOUNTER — Telehealth: Payer: Self-pay | Admitting: Endocrinology

## 2016-02-02 NOTE — Telephone Encounter (Signed)
I contacted the patient and advised the message she got was a reminder call about her 16th follow up appointment with Dr. Loanne Drilling. Patient stated she wanted to be sure and stated she would keep the appointment as scheduled.

## 2016-02-02 NOTE — Telephone Encounter (Signed)
Patient called the [facility] got a reminder call for an appointment with Loanne Drilling, she hasno idea where and with whom that it is. I believe she is confused. Please advise patient

## 2016-02-03 ENCOUNTER — Other Ambulatory Visit: Payer: Self-pay | Admitting: Orthopedic Surgery

## 2016-02-03 DIAGNOSIS — M5416 Radiculopathy, lumbar region: Secondary | ICD-10-CM

## 2016-02-06 ENCOUNTER — Ambulatory Visit: Payer: Medicare Other | Admitting: Endocrinology

## 2016-02-07 MED ORDER — ESCITALOPRAM OXALATE 20 MG PO TABS: 20.0000 mg | ORAL_TABLET | Freq: Every day | ORAL | 0 refills | Status: DC

## 2016-02-07 NOTE — Telephone Encounter (Signed)
Per pt pharmacy need a refill on escitalopram (LEXAPRO) 20 MG tablet  Called in  To [facility]  [phone] need a 90 day supply

## 2016-02-07 NOTE — Telephone Encounter (Signed)
Rx only sent for 30 days. Last Rx was for 90 days & patient was suppose to make an appointment.

## 2016-02-08 ENCOUNTER — Ambulatory Visit (INDEPENDENT_AMBULATORY_CARE_PROVIDER_SITE_OTHER): Payer: Medicare Other | Admitting: Endocrinology

## 2016-02-08 ENCOUNTER — Encounter: Payer: Self-pay | Admitting: Endocrinology

## 2016-02-08 VITALS — BP 118/60 | HR 64 | Ht 65.0 in | Wt 196.0 lb

## 2016-02-08 DIAGNOSIS — I6529 Occlusion and stenosis of unspecified carotid artery: Secondary | ICD-10-CM | POA: Diagnosis not present

## 2016-02-08 DIAGNOSIS — E119 Type 2 diabetes mellitus without complications: Secondary | ICD-10-CM | POA: Diagnosis not present

## 2016-02-08 DIAGNOSIS — R635 Abnormal weight gain: Secondary | ICD-10-CM | POA: Diagnosis not present

## 2016-02-08 DIAGNOSIS — L814 Other melanin hyperpigmentation: Secondary | ICD-10-CM | POA: Diagnosis not present

## 2016-02-08 DIAGNOSIS — D1801 Hemangioma of skin and subcutaneous tissue: Secondary | ICD-10-CM | POA: Diagnosis not present

## 2016-02-08 DIAGNOSIS — L821 Other seborrheic keratosis: Secondary | ICD-10-CM | POA: Diagnosis not present

## 2016-02-08 DIAGNOSIS — D225 Melanocytic nevi of trunk: Secondary | ICD-10-CM | POA: Diagnosis not present

## 2016-02-08 LAB — POCT GLYCOSYLATED HEMOGLOBIN (HGB A1C): Hemoglobin A1C: 9.2

## 2016-02-08 MED ORDER — DEXAMETHASONE 1 MG PO TABS: ORAL_TABLET | ORAL | 0 refills | Status: DC

## 2016-02-08 MED ORDER — SITAGLIPTIN PHOSPHATE 100 MG PO TABS: 100.0000 mg | ORAL_TABLET | Freq: Every day | ORAL | 11 refills | Status: DC

## 2016-02-08 NOTE — Patient Instructions (Addendum)
You should do a "dexamethasone suppression test."  For this, you would take dexamethasone 1 mg at 10 pm (I have sent a prescription to your pharmacy), then come in for a "cortisol" blood test the next morning before 9 am.  You do not need to be fasting for this test.  I have sent a prescription to your pharmacy, to add [place].   Please come back for a follow-up appointment in 1 month. check your blood sugar once a day.  vary the time of day when you check, between before the 3 meals, and at bedtime.  also check if you have symptoms of your blood sugar being too high or too low.  please keep a record of the readings and bring it to your next appointment here (or you can bring the meter itself).  You can write it on any piece of paper.  please call us sooner if your blood sugar goes below 70, or if you have a lot of readings over 200.

## 2016-02-08 NOTE — Progress Notes (Signed)
Subjective:    Patient ID: Breanna Webster, female    DOB: 1945-12-14, 70 y.o.   MRN: [id number]  HPI Pt returns for f/u of diabetes mellitus: DM type: 2.  Dx'ed: 2003.  Complications: none.   Therapy: 2 oral meds.    GDM: never.   DKA: never.  Severe hypoglycemia: never.  Pancreatitis: never.  Other: she has never been on insulin; she stopped metformin due to nausea and diarrhea; pioglitizone is precluded by edema.  She stopped trulicity, due to cost.  She declines [place], due to cost.  Interval history: She takes meds as rx'ed.  Main symptoms are back pain (sees ortho), and fatigue.  She had a steroid injection 2 weeks ago.  The next injection is scheduled for next week.  She does not check cbg's.  Past Medical History:  Diagnosis Date  . Arthritis   . Carotid artery stenosis    Mild  . Colon polyps   . Diabetes mellitus   . Diverticulitis   . Diverticulosis   . GERD (gastroesophageal reflux disease)   . Grave's disease   . Hyperlipidemia   . Hypertension   . IBS (irritable bowel syndrome)   . Osteopenia   . Thalassemia minor     Past Surgical History:  Procedure Laterality Date  . CHOLECYSTECTOMY    . HIATAL HERNIA REPAIR    . HYSTEROSCOPY     fibroids  . laparoscopy     fibroids    Social History   Social History  . Marital status: Married    Spouse name: N/A  . Number of children: 2  . Years of education: N/A   Occupational History  . fundraising    Social History Main Topics  . Smoking status: Never Smoker  . Smokeless tobacco: Never Used  . Alcohol use No  . Drug use: No  . Sexual activity: Not on file   Other Topics Concern  . Not on file   Social History Narrative   Lives with husband in a one story home.     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   Education: college.    Current Outpatient Prescriptions on File Prior to Visit  Medication Sig Dispense Refill  .  aspirin 81 MG tablet Take 81 mg by mouth daily.      . bisoprolol (ZEBETA) 5 MG tablet Take 1 tablet (5 mg total) by mouth daily. 30 tablet 11  . bromocriptine (PARLODEL) 2.5 MG tablet 1/4 tab daily 8 tablet 11  . escitalopram (LEXAPRO) 20 MG tablet Take 1 tablet (20 mg total) by mouth daily. 30 tablet 0  . glimepiride (AMARYL) 4 MG tablet Take 1 tablet (4 mg total) by mouth daily before breakfast. 30 tablet 3  . hydrochlorothiazide (HYDRODIURIL) 12.5 MG tablet take 1 tablet by mouth once daily 90 tablet 0  . Multiple Vitamin (MULITIVITAMIN WITH MINERALS) TABS Take 1 tablet by mouth daily.    . nortriptyline (PAMELOR) 25 MG capsule Take 1 capsule (25 mg total) by mouth at bedtime. 90 capsule 1  . SYNTHROID 150 MCG tablet Take 1 tablet (150 mcg total) by mouth daily. 90 tablet 3  . valsartan (DIOVAN) 160 MG tablet take 1 tablet by mouth once daily 90 tablet 0   No current facility-administered medications on file prior to visit.     Allergies  Allergen Reactions  . Statins Other (See Comments)    Muscle aches  Family History  Problem Relation Age of Onset  . Adopted: Yes  . Healthy Son     x 2  . Headache      Cluster headaches  . Colon cancer Neg Hx   . Pancreatic cancer Neg Hx   . Rectal cancer Neg Hx   . Stomach cancer Neg Hx     BP 118/60   Pulse 64   Ht 5' 5"  (1.651 m)   Wt 196 lb (88.9 kg)   SpO2 94%   BMI 32.62 kg/m   Review of Systems She has weight gain.      Objective:   Physical Exam VITAL SIGNS:  See vs page GENERAL: no distress Pulses: dorsalis pedis intact bilat.   MSK: no deformity of the feet CV: trace bilat leg edema.   Skin:  no ulcer on the feet.  normal color and temp on the feet. Neuro: sensation is intact to touch on the feet.   Lab Results  Component Value Date   HGBA1C 7.6 12/07/2015      Assessment & Plan:  Type 2 DM: she needs increased rx, if it can be done with a regimen that avoids or minimizes hypoglycemia.  We discussed  options.  She declines acarbose.  She also declines insulin.   Low-back pain: this may account for increased a1c. I told her that she should start checking cbg's, to track the effect of steroids on glycemic control.   Obesity: worse.    Patient is advised the following: Patient Instructions  You should do a "dexamethasone suppression test."  For this, you would take dexamethasone 1 mg at 10 pm (I have sent a prescription to your pharmacy), then come in for a "cortisol" blood test the next morning before 9 am.  You do not need to be fasting for this test.  I have sent a prescription to your pharmacy, to add [place].   Please come back for a follow-up appointment in 1 month. check your blood sugar once a day.  vary the time of day when you check, between before the 3 meals, and at bedtime.  also check if you have symptoms of your blood sugar being too high or too low.  please keep a record of the readings and bring it to your next appointment here (or you can bring the meter itself).  You can write it on any piece of paper.  please call us sooner if your blood sugar goes below 70, or if you have a lot of readings over 200.

## 2016-02-09 ENCOUNTER — Telehealth: Payer: Self-pay | Admitting: Gastroenterology

## 2016-02-13 ENCOUNTER — Ambulatory Visit
Admission: RE | Admit: 2016-02-13 | Discharge: 2016-02-13 | Disposition: A | Discharge status: Home / Self Care | Payer: Medicare Other | Source: Ambulatory Visit | Attending: Orthopedic Surgery | Admitting: Orthopedic Surgery

## 2016-02-13 DIAGNOSIS — M5416 Radiculopathy, lumbar region: Secondary | ICD-10-CM

## 2016-02-13 DIAGNOSIS — M5126 Other intervertebral disc displacement, lumbar region: Secondary | ICD-10-CM | POA: Diagnosis not present

## 2016-02-13 MED ORDER — IOPAMIDOL (ISOVUE-M 200) INJECTION 41%
1.0000 mL | Freq: Once | INTRAMUSCULAR | Status: AC
  Administered 2016-02-13: 1 mL via EPIDURAL

## 2016-02-13 MED ORDER — METHYLPREDNISOLONE ACETATE 40 MG/ML INJ SUSP (RADIOLOG
120.0000 mg | Freq: Once | INTRAMUSCULAR | Status: AC
  Administered 2016-02-13: 120 mg via EPIDURAL

## 2016-02-13 NOTE — Discharge Instructions (Signed)

## 2016-02-15 ENCOUNTER — Encounter: Payer: Self-pay | Admitting: Endocrinology

## 2016-02-15 DIAGNOSIS — E119 Type 2 diabetes mellitus without complications: Secondary | ICD-10-CM | POA: Diagnosis not present

## 2016-02-15 DIAGNOSIS — H2513 Age-related nuclear cataract, bilateral: Secondary | ICD-10-CM | POA: Diagnosis not present

## 2016-02-15 LAB — HM DIABETES EYE EXAM

## 2016-02-17 ENCOUNTER — Other Ambulatory Visit (INDEPENDENT_AMBULATORY_CARE_PROVIDER_SITE_OTHER): Payer: Medicare Other

## 2016-02-17 ENCOUNTER — Ambulatory Visit (INDEPENDENT_AMBULATORY_CARE_PROVIDER_SITE_OTHER): Payer: Medicare Other

## 2016-02-17 ENCOUNTER — Telehealth: Payer: Self-pay | Admitting: Family Medicine

## 2016-02-17 DIAGNOSIS — R635 Abnormal weight gain: Secondary | ICD-10-CM | POA: Diagnosis not present

## 2016-02-17 DIAGNOSIS — Z23 Encounter for immunization: Secondary | ICD-10-CM

## 2016-02-17 NOTE — Telephone Encounter (Signed)
See below

## 2016-02-17 NOTE — Telephone Encounter (Signed)
This is fine with me. Thanks, BJ

## 2016-02-17 NOTE — Telephone Encounter (Signed)
Breanna Webster would like to transfer to [employer] from Betty [name]. Then schedule an appointment for a yearly physical and to get her pneumonia shot.

## 2016-02-20 LAB — CORTISOL: Cortisol, Plasma: 0.9 ug/dL

## 2016-02-21 NOTE — Telephone Encounter (Signed)
That is fine with me.

## 2016-02-21 NOTE — Telephone Encounter (Signed)
Is this ok?

## 2016-02-22 ENCOUNTER — Telehealth: Payer: Self-pay | Admitting: Family Medicine

## 2016-02-22 NOTE — Telephone Encounter (Signed)
I contacted the patient's husband and advised the cortisol level from 02/17/2016 was normal. Patient's husband is on the DPR and he will advise the patient once she returns home from work. I advised him to have the patient call if she had any questions.

## 2016-02-22 NOTE — Telephone Encounter (Signed)
Patient would like the results of the labs she had last week.

## 2016-03-04 ENCOUNTER — Other Ambulatory Visit: Payer: Self-pay | Admitting: Family Medicine

## 2016-03-05 NOTE — Telephone Encounter (Signed)
Okay to refill? 

## 2016-03-08 ENCOUNTER — Other Ambulatory Visit: Payer: Self-pay | Admitting: Family Medicine

## 2016-03-08 ENCOUNTER — Ambulatory Visit (INDEPENDENT_AMBULATORY_CARE_PROVIDER_SITE_OTHER): Payer: Medicare Other | Admitting: Endocrinology

## 2016-03-08 ENCOUNTER — Encounter: Payer: Self-pay | Admitting: Endocrinology

## 2016-03-08 VITALS — BP 126/70 | HR 74 | Ht 65.0 in | Wt 197.0 lb

## 2016-03-08 DIAGNOSIS — E119 Type 2 diabetes mellitus without complications: Secondary | ICD-10-CM | POA: Diagnosis not present

## 2016-03-08 DIAGNOSIS — I6529 Occlusion and stenosis of unspecified carotid artery: Secondary | ICD-10-CM

## 2016-03-08 MED ORDER — GLUCOSE BLOOD VI STRP: 1.0000 | ORAL_STRIP | Freq: Every day | 12 refills | Status: DC

## 2016-03-08 NOTE — Patient Instructions (Addendum)
A diabetes blood test isrequested for you today.  We'll let you know about the results.  If necessary, we can add "jardiance."  check your blood sugar once a day.  vary the time of day when you check, between before the 3 meals, and at bedtime.  also check if you have symptoms of your blood sugar being too high or too low.  please keep a record of the readings and bring it to your next appointment here (or you can bring the meter itself).  You can write it on any piece of paper.  please call us sooner if your blood sugar goes below 70, or if you have a lot of readings over 200.  hjere is a meter.  I have sent a prescription to your pharmacy, for strips.  Please come back for a follow-up appointment in 2 months.

## 2016-03-08 NOTE — Progress Notes (Signed)
Subjective:    Patient ID: Breanna Webster, female    DOB: 11-18-1945, 70 y.o.   MRN: [id number]  HPI Pt returns for f/u of diabetes mellitus: DM type: 2.  Dx'ed: 2003.  Complications: none.   Therapy: 3 oral meds.    GDM: never.   DKA: never.  Severe hypoglycemia: never.  Pancreatitis: never.  Other: she has never been on insulin; she stopped metformin-XR due to nausea and diarrhea; pioglitizone is precluded by edema.  She stopped trulicity, due to cost.   Interval history: She takes meds as rx'ed.  Main symptom is back pain (sees ortho).  She does not check cbg's.   Past Medical History:  Diagnosis Date  . Arthritis   . Carotid artery stenosis    Mild  . Colon polyps   . Diabetes mellitus   . Diverticulitis   . Diverticulosis   . GERD (gastroesophageal reflux disease)   . Grave's disease   . Hyperlipidemia   . Hypertension   . IBS (irritable bowel syndrome)   . Osteopenia   . Thalassemia minor     Past Surgical History:  Procedure Laterality Date  . CHOLECYSTECTOMY    . HIATAL HERNIA REPAIR    . HYSTEROSCOPY     fibroids  . laparoscopy     fibroids    Social History   Social History  . Marital status: Married    Spouse name: N/A  . Number of children: 2  . Years of education: N/A   Occupational History  . fundraising    Social History Main Topics  . Smoking status: Never Smoker  . Smokeless tobacco: Never Used  . Alcohol use No  . Drug use: No  . Sexual activity: Not on file   Other Topics Concern  . Not on file   Social History Narrative   Lives with husband in a one story home.     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   Education: college.    Current Outpatient Prescriptions on File Prior to Visit  Medication Sig Dispense Refill  . aspirin 81 MG tablet Take 81 mg by mouth daily.      . bisoprolol (ZEBETA) 5 MG tablet Take 1 tablet (5 mg total) by mouth daily. 30  tablet 11  . bromocriptine (PARLODEL) 2.5 MG tablet 1/4 tab daily 8 tablet 11  . escitalopram (LEXAPRO) 20 MG tablet Take 1 tablet (20 mg total) by mouth daily. 30 tablet 0  . glimepiride (AMARYL) 4 MG tablet Take 1 tablet (4 mg total) by mouth daily before breakfast. 30 tablet 3  . hydrochlorothiazide (HYDRODIURIL) 12.5 MG tablet take 1 tablet by mouth once daily 90 tablet 0  . Multiple Vitamin (MULITIVITAMIN WITH MINERALS) TABS Take 1 tablet by mouth daily.    . nortriptyline (PAMELOR) 25 MG capsule Take 1 capsule (25 mg total) by mouth at bedtime. 90 capsule 1  . sitaGLIPtin (JANUVIA) 100 MG tablet Take 1 tablet (100 mg total) by mouth daily. 30 tablet 11  . SYNTHROID 150 MCG tablet Take 1 tablet (150 mcg total) by mouth daily. 90 tablet 3  . valsartan (DIOVAN) 160 MG tablet take 1 tablet by mouth once daily 90 tablet 0   No current facility-administered medications on file prior to visit.     Allergies  Allergen Reactions  . Statins Other (See Comments)    Muscle aches    Family History  Problem Relation Age of Onset  . Adopted: Yes  . Healthy Son     x 2  . Headache      Cluster headaches  . Colon cancer Neg Hx   . Pancreatic cancer Neg Hx   . Rectal cancer Neg Hx   . Stomach cancer Neg Hx     BP 126/70   Pulse 74   Ht 5' 5"  (1.651 m)   Wt 197 lb (89.4 kg)   SpO2 94%   BMI 32.78 kg/m    Review of Systems She denies hypoglycemia    Objective:   Physical Exam VITAL SIGNS:  See vs page.  GENERAL: no distress.  Pulses: dorsalis pedis intact bilat.   MSK: no deformity of the feet.  CV: no leg edema.  Skin:  no ulcer on the feet.  normal color and temp on the feet.  Neuro: sensation is intact to touch on the feet.        Assessment & Plan:  Type 2 DM, uncertain control.  Check fructosamine.  Patient is advised the following: Patient Instructions  A diabetes blood test isrequested for you today.  We'll let you know about the results.  If necessary, we can  add "jardiance."  check your blood sugar once a day.  vary the time of day when you check, between before the 3 meals, and at bedtime.  also check if you have symptoms of your blood sugar being too high or too low.  please keep a record of the readings and bring it to your next appointment here (or you can bring the meter itself).  You can write it on any piece of paper.  please call us sooner if your blood sugar goes below 70, or if you have a lot of readings over 200.  hjere is a meter.  I have sent a prescription to your pharmacy, for strips.  Please come back for a follow-up appointment in 2 months.

## 2016-03-09 ENCOUNTER — Telehealth: Payer: Self-pay | Admitting: Endocrinology

## 2016-03-09 DIAGNOSIS — E119 Type 2 diabetes mellitus without complications: Secondary | ICD-10-CM

## 2016-03-09 NOTE — Telephone Encounter (Signed)
Not resulted yet. Will call patient once received.

## 2016-03-09 NOTE — Telephone Encounter (Signed)
Pt is calling to see what he A1C is [phone]

## 2016-03-12 DIAGNOSIS — M545 Low back pain: Secondary | ICD-10-CM | POA: Diagnosis not present

## 2016-03-12 LAB — FRUCTOSAMINE: Fructosamine: 373 umol/L — ABNORMAL HIGH (ref 190–270)

## 2016-03-12 NOTE — Telephone Encounter (Signed)
Could you review the patient's fructosamine result and please advise, Thanks!

## 2016-03-12 NOTE — Telephone Encounter (Signed)
Patient is calling for the results of her A1C, if resulted. please advise

## 2016-03-12 NOTE — Telephone Encounter (Signed)
Patient is asking what medication do you want to take.

## 2016-03-12 NOTE — Telephone Encounter (Signed)
See message and please advise, Thanks!  

## 2016-03-12 NOTE — Telephone Encounter (Signed)
I contacted the patient and left a voicemail advising the mychart message sated below.   This is like an a1c of 8.0%. This is not bad, but you should take an additional pill or take insulin, to get the blood sugar to a good range. I hope you feel well.    Requested the patient to let us know either by way of my chart or telephone call if she would like to take the additional insulin or the extra oral medication.

## 2016-03-12 NOTE — Telephone Encounter (Signed)
I sent mychart message

## 2016-03-12 NOTE — Telephone Encounter (Signed)
Options are "jardiance (once a day--eliminates sugar through the urine)," or "repaglinide (this is a cheaper generic--take with meals--helps your body make more insulin).

## 2016-03-13 NOTE — Telephone Encounter (Signed)
Patient did not understand the message, please advise

## 2016-03-13 NOTE — Telephone Encounter (Signed)
I contacted the patient and she stated she would like to start the insulin, but she would like to meet with Vaughan Basta to discuss before starting on the insulin. Patient stated she has 3 pills left of Januvia and wanted to know what should do until she speaks with Vaughan Basta about the insulin. Patient stated she did not want to get a refill for her medication and then have to d/c to start the insulin. Please advise, Thanks!

## 2016-03-13 NOTE — Telephone Encounter (Signed)
Ok, I have requested an appointment with Vaughan Basta.  It is best to buy just a few tablets of januvia, to get you to that appointment.

## 2016-03-13 NOTE — Telephone Encounter (Signed)
I contacted the patient and advised of message via voicemail. Requested a call back from the patient to verify if she would be ok with sending in a few januvia tablets to hold her over until she can see Vaughan Basta.

## 2016-03-14 ENCOUNTER — Telehealth: Payer: Self-pay | Admitting: Family Medicine

## 2016-03-14 ENCOUNTER — Other Ambulatory Visit: Payer: Self-pay | Admitting: Adult Health

## 2016-03-14 MED ORDER — SITAGLIPTIN PHOSPHATE 100 MG PO TABS: 100.0000 mg | ORAL_TABLET | Freq: Every day | ORAL | 0 refills | Status: DC

## 2016-03-14 NOTE — Telephone Encounter (Signed)
I contacted the patient and advised of message. Patient voiced understanding. Refill for 10 tablets of Januvia sent to [facility].

## 2016-03-14 NOTE — Telephone Encounter (Signed)
Pharmacy called for pt to request a refill of escitalopram (LEXAPRO) 20 MG tablet  Pt does not have an appt to establish with cory until 12/21,  but needs enough to get through to her appointment.

## 2016-03-14 NOTE — Telephone Encounter (Signed)
Ok to refill for 90 days  

## 2016-03-14 NOTE — Telephone Encounter (Signed)
Please advise 

## 2016-03-14 NOTE — Telephone Encounter (Signed)
I last seen Ms Breanna Webster in 10/10/15 to follow on another acute visit. She was instructed to follow for her other chronic medical problems. Requested transfer of care 01/2016.  I am not authorizing medication refill, she needs to establish with PCP, may need an acute visit with [facility] to have medication refill.

## 2016-03-14 NOTE — Telephone Encounter (Signed)
Please advise. Patient is to establish with you 12/21 - and needs a refill to last until then.

## 2016-03-14 NOTE — Addendum Note (Signed)
Addended by: Verlin Grills T on: 03/14/2016 09:38 AM   Modules accepted: Orders

## 2016-03-19 ENCOUNTER — Telehealth: Payer: Self-pay | Admitting: Endocrinology

## 2016-03-19 ENCOUNTER — Other Ambulatory Visit: Payer: Self-pay

## 2016-03-19 DIAGNOSIS — M545 Low back pain: Secondary | ICD-10-CM | POA: Diagnosis not present

## 2016-03-19 MED ORDER — SITAGLIPTIN PHOSPHATE 100 MG PO TABS: 100.0000 mg | ORAL_TABLET | Freq: Every day | ORAL | 0 refills | Status: DC

## 2016-03-19 NOTE — Telephone Encounter (Signed)
Patient ask you to call her conserning her insulin

## 2016-03-21 ENCOUNTER — Encounter: Payer: Medicare Other | Attending: Endocrinology | Admitting: Nutrition

## 2016-03-21 ENCOUNTER — Other Ambulatory Visit: Payer: Self-pay

## 2016-03-21 ENCOUNTER — Telehealth: Payer: Self-pay | Admitting: Nutrition

## 2016-03-21 MED ORDER — INSULIN NPH (HUMAN) (ISOPHANE) 100 UNIT/ML ~~LOC~~ SUSP: 20.0000 [IU] | SUBCUTANEOUS | 11 refills | Status: DC

## 2016-03-21 NOTE — Telephone Encounter (Signed)
She is willing to start on insulin--once a day, and the [facility] brand is preferred. She would like the vial and syringes.  She was given a sample of BD 1/2 cc short needle insulin syringes.

## 2016-03-21 NOTE — Telephone Encounter (Signed)
I advised pt when she was here today, to d/c all 3 oral meds, and to start insulin.  I have sent a prescription to your pharmacy.  Call next week, to report cbg's.

## 2016-03-22 NOTE — Patient Instructions (Signed)
Take 20u of NPH insulin in the morning before breakfast

## 2016-03-22 NOTE — Progress Notes (Signed)
We discussed how the insulin works, and the need for insulin.  After much discussion of cost between pens and syringes, she decided on the syringe and vial.  She was shown how to use the pens, but prefers the lower cost with the syringe.  After it was determined which insulin she would take, she was shown how to mix the vial, how to draw up 20 units, and how to inject.  Written instructions were given for 20u  Before breafast. We discussed where to inject, and the need to rotate sites for better absorbtion We also discussed low blood sugars--symptoms and treatment.  She reported good understanding of this, and had no final questions.

## 2016-03-23 DIAGNOSIS — M545 Low back pain: Secondary | ICD-10-CM | POA: Diagnosis not present

## 2016-03-26 DIAGNOSIS — M545 Low back pain: Secondary | ICD-10-CM | POA: Diagnosis not present

## 2016-03-28 ENCOUNTER — Ambulatory Visit: Payer: Medicare Other | Admitting: Nutrition

## 2016-03-28 NOTE — Telephone Encounter (Signed)
Pt has been scheduled.  °

## 2016-03-30 ENCOUNTER — Other Ambulatory Visit: Payer: Self-pay | Admitting: Obstetrics

## 2016-03-30 DIAGNOSIS — Z1231 Encounter for screening mammogram for malignant neoplasm of breast: Secondary | ICD-10-CM

## 2016-03-30 NOTE — Telephone Encounter (Signed)
Please verify NPH insulin is 20 units each morning. Then please increase to 35 units qam Please call next week, to report cbg's

## 2016-03-30 NOTE — Telephone Encounter (Signed)
See message and please advise, Thanks!  

## 2016-03-30 NOTE — Telephone Encounter (Signed)
I contacted the patient and advised of message. Patient confirmed she is taking 20 units of insulin. Patient verbalized understanding on new instructions and will call back next week to advise on blood sugar readings.

## 2016-03-30 NOTE — Telephone Encounter (Signed)
Pt states her numbers are still high insulin not working  AM range 333-178 PM range 325 not really fluctuating much in PM

## 2016-04-02 ENCOUNTER — Encounter: Payer: Self-pay | Admitting: Dietician

## 2016-04-02 ENCOUNTER — Encounter: Payer: Medicare Other | Attending: Endocrinology | Admitting: Dietician

## 2016-04-02 DIAGNOSIS — E1165 Type 2 diabetes mellitus with hyperglycemia: Secondary | ICD-10-CM

## 2016-04-02 DIAGNOSIS — E119 Type 2 diabetes mellitus without complications: Secondary | ICD-10-CM | POA: Diagnosis not present

## 2016-04-02 DIAGNOSIS — Z713 Dietary counseling and surveillance: Secondary | ICD-10-CM | POA: Insufficient documentation

## 2016-04-02 DIAGNOSIS — E118 Type 2 diabetes mellitus with unspecified complications: Secondary | ICD-10-CM

## 2016-04-02 NOTE — Progress Notes (Signed)
Diabetes Self-Management Education  Visit Type: Follow-up  Appt. Start Time: 0940 Appt. End Time: 6063  04/02/2016  Breanna Webster, identified by name and date of birth, is a 70 y.o. female with a diagnosis of Diabetes: Type 2.   Other hx includes GERD, HTN, vitamin D deficiency, Hyperlipidemia, and hypothyroidism.  She also reports a couple of bulging disks in her back and is currently going to PT for this.  She received a Cortizone shot in her back about a month ago but this did not work.  She recently came off her other diabetes medications due to expense and is now on insulin.  She is taking 30 units of Humulin N each am and is to increase to 35 units.  She reports a decreased blood sugar on insulin with reading of 219 this am decreased from 197 usually.  Fructosamine 373 03/08/16.  Weight today 192 lbs which has decreased 5 lbs in the past 2 weeks from 197 lbs.    Patient lives with her husband.  They follow a kosher diet.  They share shopping and patient cooks.  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  She has followed Weight Watchers in the past.  ASSESSMENT  Height 5' 5"  (1.651 m), weight 192 lb (87.1 kg). Body mass index is 31.95 kg/m.      Diabetes Self-Management Education - 04/02/16 1003      Visit Information   Visit Type Follow-up     Initial Visit   Diabetes Type Type 2     Health Coping   How would you rate your overall health? Good     Psychosocial Assessment   Patient Belief/Attitude about Diabetes Motivated to manage diabetes   Self-care barriers None   Self-management support Doctor's office;Family   Other persons present Patient   Patient Concerns Nutrition/Meal planning;Glycemic Control;Weight Control   Special Needs None   Preferred Learning Style No preference indicated   Learning Readiness Ready   How often do you need to have someone help you when you read instructions, pamphlets, or other written materials from your  doctor or pharmacy? 1 - Never   What is the last grade level you completed in school? 3 years college     Complications   Last HgB A1C per patient/outside source 9.2 %  02/08/16   How often do you check your blood sugar? 1-2 times/day   Fasting Blood glucose range (mg/dL) >200   Postprandial Blood glucose range (mg/dL) >200   Number of hypoglycemic episodes per month 0   Number of hyperglycemic episodes per week 21   Can you tell when your blood sugar is high? No   Have you had a dilated eye exam in the past 12 months? Yes   Have you had a dental exam in the past 12 months? Yes   Are you checking your feet? Yes   How many days per week are you checking your feet? 7     Dietary Intake   Breakfast 1 egg, 2 egg whites, small croisant or rye or sourdough toast OR blueberry or banana nut pancakes with vegetarian sausage OR regular oatmeal with nuts, cinnamon and stevia, milk   Snack (morning) none   Lunch tuna sandwich or salad with pasta and tuna    Snack (afternoon) triple zero yogurt   Dinner salmon or steak with broccolli OR spinach stuffed ravioli OR soup OR lentils and rice   Snack (evening) apple or other fruit,  pretzels or cheeze its   Beverage(s) decaf coffee, no sugar creamer, water, diet soda, flavored water, Unsweetened tea with stevia and lemon      Subsequent Visit   Since your last visit have you continued or begun to take your medications as prescribed? Yes  does need to increase her insulin dose to 35 units daily   Since your last visit have you experienced any weight changes? Loss   Weight Loss (lbs) 20   Since your last visit, are you checking your blood glucose at least once a day? Yes      Individualized Plan for Diabetes Self-Management Training:   Learning Objective:  Patient will have a greater understanding of diabetes self-management. Patient education plan is to attend individual and/or group sessions per assessed needs and concerns.   Plan:   Patient  Instructions  Continue to stay active.  Aim for 30 minutes most days. Aim for 2-3 Carb Choices per meal (30-45 grams) +/- 1 either way  Aim for 0-1 Carbs per snack if hungry  Include protein in moderation with your meals and snacks Consider reading food labels for Total Carbohydrate and Fat Grams of foods Consider checking BG at alternate times per day as directed by MD  Continue taking medication as directed by MD  Eat slowly, stop when you are satisfied.    Expected Outcomes:     Education material provided: Living Well with Diabetes, Food label handouts, A1C conversion sheet, Meal plan card, My Plate and Snack sheet  If problems or questions, patient to contact team via:  Phone and Email  Future DSME appointment:  prn

## 2016-04-02 NOTE — Patient Instructions (Signed)
Continue to stay active.  Aim for 30 minutes most days. Aim for 2-3 Carb Choices per meal (30-45 grams) +/- 1 either way  Aim for 0-1 Carbs per snack if hungry  Include protein in moderation with your meals and snacks Consider reading food labels for Total Carbohydrate and Fat Grams of foods Consider checking BG at alternate times per day as directed by MD  Continue taking medication as directed by MD  Eat slowly, stop when you are satisfied.

## 2016-04-04 DIAGNOSIS — M545 Low back pain: Secondary | ICD-10-CM | POA: Diagnosis not present

## 2016-04-04 NOTE — Telephone Encounter (Signed)
Patient is returning your call.  

## 2016-04-04 NOTE — Telephone Encounter (Signed)
Ok, please increase to 30 units qam.

## 2016-04-04 NOTE — Telephone Encounter (Signed)
I contacted the patient and advised of new instructions via voicemail. Requested a call back from the patient if she would like to discuss further.

## 2016-04-04 NOTE — Telephone Encounter (Signed)
See message and please advise, Thanks!  

## 2016-04-04 NOTE — Telephone Encounter (Signed)
Patient stated that b/s have not not gone down any with  NPH insulin. Running at 222 pleas advise

## 2016-04-05 NOTE — Telephone Encounter (Signed)
Pt is already at 35 u for the increase to 30 u per the previous note is confusing, please advise  See note from 12/8

## 2016-04-05 NOTE — Telephone Encounter (Signed)
Ok, please increase to 45 units qam Call next week if still high

## 2016-04-05 NOTE — Telephone Encounter (Signed)
See message and please advise, Thanks!  

## 2016-04-05 NOTE — Telephone Encounter (Signed)
I contacted the patient and advised of new instructions via voicemail. Requested a call back if the patient would like to discuss further.

## 2016-04-09 DIAGNOSIS — M545 Low back pain: Secondary | ICD-10-CM | POA: Diagnosis not present

## 2016-04-10 ENCOUNTER — Telehealth: Payer: Self-pay | Admitting: Endocrinology

## 2016-04-10 NOTE — Telephone Encounter (Signed)
I contacted the patient and she advised me of her recent blood sugar readings. 04/05/2016: Fasting: 217 Evening: 289  04/06/2016:  Fasting: 245 Evening: 315  04/07/2016:  Fasting: 214 Evening: 375  04/08/2016: Fasting: 175 Evening: 325   04/09/2016: No readings  04/10/2016: 264 Fasting.   Patient is currently is taking 45 units in the am. Patient does not think the insulin is working for her and stated the oral medication, [place] was working better. Could you review and please advise during Dr. Cordelia Pen absence? Thanks!

## 2016-04-10 NOTE — Telephone Encounter (Signed)
Pt called in and said that she needs to speak to [facility] as soon as possible, she said the insulin is not working at all.

## 2016-04-11 ENCOUNTER — Other Ambulatory Visit: Payer: Self-pay | Admitting: Family Medicine

## 2016-04-11 ENCOUNTER — Other Ambulatory Visit: Payer: Self-pay | Admitting: Endocrinology

## 2016-04-11 ENCOUNTER — Other Ambulatory Visit: Payer: Self-pay | Admitting: Adult Health

## 2016-04-11 MED ORDER — INSULIN NPH (HUMAN) (ISOPHANE) 100 UNIT/ML ~~LOC~~ SUSP: SUBCUTANEOUS | 4 refills | Status: DC

## 2016-04-11 NOTE — Telephone Encounter (Signed)
Ok to refill for one year

## 2016-04-11 NOTE — Telephone Encounter (Signed)
Patient needs to start 25 units of insulin at suppertime also. She can start back on Januvia in combination also but not stop insulin as yet

## 2016-04-11 NOTE — Telephone Encounter (Signed)
I called the patient and advised of message. Patient stated she would start taking 45 units in the am and start the 25 units in the pm. Patient stated she would like to try this first and if her blood sugars do not come down she will call back and see about adding the Januvia then.

## 2016-04-11 NOTE — Telephone Encounter (Signed)
Ok to refill 

## 2016-04-12 ENCOUNTER — Encounter: Payer: Self-pay | Admitting: Adult Health

## 2016-04-12 ENCOUNTER — Ambulatory Visit (INDEPENDENT_AMBULATORY_CARE_PROVIDER_SITE_OTHER): Payer: Medicare Other | Admitting: Adult Health

## 2016-04-12 VITALS — BP 100/56 | Temp 98.4°F | Ht 65.0 in | Wt 192.4 lb

## 2016-04-12 DIAGNOSIS — Z6832 Body mass index (BMI) 32.0-32.9, adult: Secondary | ICD-10-CM | POA: Diagnosis not present

## 2016-04-12 DIAGNOSIS — E6609 Other obesity due to excess calories: Secondary | ICD-10-CM

## 2016-04-12 DIAGNOSIS — E785 Hyperlipidemia, unspecified: Secondary | ICD-10-CM | POA: Diagnosis not present

## 2016-04-12 DIAGNOSIS — I10 Essential (primary) hypertension: Secondary | ICD-10-CM

## 2016-04-12 DIAGNOSIS — I6529 Occlusion and stenosis of unspecified carotid artery: Secondary | ICD-10-CM | POA: Diagnosis not present

## 2016-04-12 DIAGNOSIS — E119 Type 2 diabetes mellitus without complications: Secondary | ICD-10-CM

## 2016-04-12 NOTE — Patient Instructions (Signed)
It was great seeing you today!  I will follow up with you once your labs are done.   All of your medications were sent into the pharmacy   Please follow up with me in one year or sooner if needed

## 2016-04-12 NOTE — Progress Notes (Deleted)
Subjective:    Patient ID: Breanna Webster, female    DOB: 07-17-1945, 70 y.o.   MRN: [id number]  HPI   Patient presents for yearly follow up exam due to history of  has a past medical history of Arthritis; Carotid artery stenosis; Colon polyps; Diabetes mellitus; Diverticulitis; Diverticulosis; GERD (gastroesophageal reflux disease); Grave's disease; Hyperlipidemia; Hypertension; IBS (irritable bowel syndrome); Osteopenia; and Thalassemia minor.  All immunizations and health maintenance protocols were reviewed with the patient and needed orders were placed.  Appropriate screening laboratory values were ordered for the patient including screening of hyperlipidemia, renal function and hepatic function.  Medication reconciliation,  past medical history, social history, problem list and allergies were reviewed in detail with the patient  Goals were established with regard to weight loss, exercise, and  diet in compliance with medications  End of life planning was discussed.  She is followed by Endocrinology for her DM and Graves Disease.  She has no acute complaints today   She is up to date on her colonoscopy. Her next mammogram is in January. She reports that she is up to date on her vision exams and dental exams. She does self breast exams    Review of Systems  Constitutional: Negative.   HENT: Negative.   Eyes: Negative.   Respiratory: Negative.   Cardiovascular: Negative.   Gastrointestinal: Negative.   Endocrine: Negative.   Genitourinary: Negative.   Musculoskeletal: Negative.   Allergic/Immunologic: Negative.   Neurological: Negative.   Hematological: Negative.   Psychiatric/Behavioral: Negative.    Past Medical History:  Diagnosis Date  . Arthritis   . Carotid artery stenosis    Mild  . Colon polyps   . Diabetes mellitus   . Diverticulitis   . Diverticulosis   . GERD (gastroesophageal reflux disease)   . Grave's disease   . Hyperlipidemia   . Hypertension    . IBS (irritable bowel syndrome)   . Osteopenia   . Thalassemia minor     Social History   Social History  . Marital status: Married    Spouse name: N/A  . Number of children: 2  . Years of education: N/A   Occupational History  . fundraising    Social History Main Topics  . Smoking status: Never Smoker  . Smokeless tobacco: Never Used  . Alcohol use No  . Drug use: No  . Sexual activity: Not on file   Other Topics Concern  . Not on file   Social History Narrative   Lives with husband in a one story home.     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   Education: college.    Past Surgical History:  Procedure Laterality Date  . CHOLECYSTECTOMY    . HIATAL HERNIA REPAIR    . HYSTEROSCOPY     fibroids  . laparoscopy     fibroids    Family History  Problem Relation Age of Onset  . Adopted: Yes  . Healthy Son     x 2  . Headache      Cluster headaches  . Colon cancer Neg Hx   . Pancreatic cancer Neg Hx   . Rectal cancer Neg Hx   . Stomach cancer Neg Hx     Allergies  Allergen Reactions  . Statins Other (See Comments)    Muscle aches    Current Outpatient Prescriptions on File Prior to Visit  Medication Sig Dispense Refill  .  aspirin 81 MG tablet Take 81 mg by mouth daily.      . BD INSULIN SYRINGE ULTRAFINE 31G X 15/64" 0.5 ML MISC use twice a day 200 each 2  . bisoprolol (ZEBETA) 5 MG tablet Take 1 tablet (5 mg total) by mouth daily. 30 tablet 11  . escitalopram (LEXAPRO) 20 MG tablet take 1 tablet by mouth once daily 90 tablet 0  . glucose blood (ONETOUCH VERIO) test strip 1 each by Other route daily. And lancets 1/day 100 each 12  . hydrochlorothiazide (HYDRODIURIL) 12.5 MG tablet take 1 tablet by mouth once daily 90 tablet 0  . insulin NPH Human (HUMULIN N) 100 UNIT/ML injection Inject 45 units in the am and 25 units in the pm 30 mL 4  . Multiple Vitamin (MULITIVITAMIN WITH  MINERALS) TABS Take 1 tablet by mouth daily.    . nortriptyline (PAMELOR) 25 MG capsule take 1 capsule by mouth at bedtime 90 capsule 3  . SYNTHROID 150 MCG tablet Take 1 tablet (150 mcg total) by mouth daily. 90 tablet 3  . valsartan (DIOVAN) 160 MG tablet take 1 tablet by mouth once daily 90 tablet 0   No current facility-administered medications on file prior to visit.     BP (!) 100/56   Temp 98.4 F (36.9 C) (Oral)   Ht 5' 5"  (1.651 m)   Wt 192 lb 6.4 oz (87.3 kg)   BMI 32.02 kg/m       Objective:   Physical Exam  Constitutional: She appears well-developed and well-nourished. No distress.  Cardiovascular: Normal rate, regular rhythm, normal heart sounds and intact distal pulses.  Exam reveals no gallop and no friction rub.   No murmur heard. Skin: She is not diaphoretic.  Nursing note and vitals reviewed.      Assessment & Plan:

## 2016-04-13 ENCOUNTER — Other Ambulatory Visit: Payer: Medicare Other

## 2016-04-13 LAB — HEPATIC FUNCTION PANEL
ALT: 83 U/L — ABNORMAL HIGH (ref 0–35)
AST: 67 U/L — ABNORMAL HIGH (ref 0–37)
Albumin: 4 g/dL (ref 3.5–5.2)
Alkaline Phosphatase: 68 U/L (ref 39–117)
Bilirubin, Direct: 0.2 mg/dL (ref 0.0–0.3)
Total Bilirubin: 0.9 mg/dL (ref 0.2–1.2)
Total Protein: 6.9 g/dL (ref 6.0–8.3)

## 2016-04-13 LAB — LIPID PANEL
Cholesterol: 159 mg/dL (ref 0–200)
HDL: 42.9 mg/dL (ref >39.00)
LDL Cholesterol: 96 mg/dL (ref 0–99)
NonHDL: 116.33
Total CHOL/HDL Ratio: 4
Triglycerides: 104 mg/dL (ref 0.0–149.0)
VLDL: 20.8 mg/dL (ref 0.0–40.0)

## 2016-04-13 LAB — BASIC METABOLIC PANEL
BUN: 12 mg/dL (ref 6–23)
CO2: 28 mEq/L (ref 19–32)
Calcium: 10.1 mg/dL (ref 8.4–10.5)
Chloride: 100 mEq/L (ref 96–112)
Creatinine, Ser: 0.78 mg/dL (ref 0.40–1.20)
GFR: 77.07 mL/min (ref >60.00)
Glucose, Bld: 191 mg/dL — ABNORMAL HIGH (ref 70–99)
Potassium: 4.1 mEq/L (ref 3.5–5.1)
Sodium: 138 mEq/L (ref 135–145)

## 2016-04-13 LAB — POC URINALSYSI DIPSTICK (AUTOMATED)
Bilirubin, UA: NEGATIVE
Blood, UA: NEGATIVE
Ketones, UA: NEGATIVE
Leukocytes, UA: NEGATIVE
Nitrite, UA: NEGATIVE
Protein, UA: NEGATIVE
Spec Grav, UA: 1.01
Urobilinogen, UA: 0.2
pH, UA: 5.5

## 2016-04-13 LAB — CBC WITH DIFFERENTIAL/PLATELET
Basophils Absolute: 0 10*3/uL (ref 0.0–0.1)
Basophils Relative: 0.6 % (ref 0.0–3.0)
Eosinophils Absolute: 0.2 10*3/uL (ref 0.0–0.7)
Eosinophils Relative: 2.1 % (ref 0.0–5.0)
HCT: 37.1 % (ref 36.0–46.0)
Hemoglobin: 11.9 g/dL — ABNORMAL LOW (ref 12.0–15.0)
Lymphocytes Relative: 24.1 % (ref 12.0–46.0)
Lymphs Abs: 1.8 10*3/uL (ref 0.7–4.0)
MCHC: 32.2 g/dL (ref 30.0–36.0)
MCV: 63.6 fl — ABNORMAL LOW (ref 78.0–100.0)
Monocytes Absolute: 0.6 10*3/uL (ref 0.1–1.0)
Monocytes Relative: 8.6 % (ref 3.0–12.0)
Neutro Abs: 4.7 10*3/uL (ref 1.4–7.7)
Neutrophils Relative %: 64.6 % (ref 43.0–77.0)
Platelets: 277 10*3/uL (ref 150.0–400.0)
RBC: 5.84 Mil/uL — ABNORMAL HIGH (ref 3.87–5.11)
RDW: 14.6 % (ref 11.5–15.5)
WBC: 7.3 10*3/uL (ref 4.0–10.5)

## 2016-04-13 MED ORDER — NORTRIPTYLINE HCL 25 MG PO CAPS: 25.0000 mg | ORAL_CAPSULE | Freq: Every day | ORAL | 3 refills | Status: DC

## 2016-04-13 MED ORDER — ESCITALOPRAM OXALATE 20 MG PO TABS: 20.0000 mg | ORAL_TABLET | Freq: Every day | ORAL | 1 refills | Status: DC

## 2016-04-13 MED ORDER — HYDROCHLOROTHIAZIDE 12.5 MG PO TABS: 12.5000 mg | ORAL_TABLET | Freq: Every day | ORAL | 3 refills | Status: DC

## 2016-04-13 MED ORDER — VALSARTAN 160 MG PO TABS: 160.0000 mg | ORAL_TABLET | Freq: Every day | ORAL | 3 refills | Status: DC

## 2016-04-13 NOTE — Progress Notes (Signed)
Patient presents to clinic today to establish care. She is a pleasant 70 year old female who  has a past medical history of Arthritis; Carotid artery stenosis; Colon polyps; Diabetes mellitus; Diverticulitis; Diverticulosis; GERD (gastroesophageal reflux disease); Grave's disease; Hyperlipidemia; Hypertension; IBS (irritable bowel syndrome); Osteopenia; and Thalassemia minor.      Acute Concerns: Yearly Follow up exam  Chronic Issues: Graves Disease - she is followed by endocrinology for this currently on 150 g of Synthroid  DM II - followed by endocrinology he takes 45 units of Humulin N and her last A1c was 9.2.  Hyperlipidemia- this appears to be diet controlled  Hypertension- takes hydrochlorothiazide 12.5 mg as well as Zebeta 5 mg. today and she is on the low side  Health Maintenance: Dental -- Routine Vision --Routine Immunizations -- she will give [name] information on whether or not she had her pneumonia vaccinations Colonoscopy --2015 Mammogram --January 2018  She is followed by  1. Endocrinology - Dr. Loanne Drilling 2. Pulmonary- Dr. Melvyn Novas 3. Orthopedics 4. Cardiology   Past Medical History:  Diagnosis Date  . Arthritis   . Carotid artery stenosis    Mild  . Colon polyps   . Diabetes mellitus   . Diverticulitis   . Diverticulosis   . GERD (gastroesophageal reflux disease)   . Grave's disease   . Hyperlipidemia   . Hypertension   . IBS (irritable bowel syndrome)   . Osteopenia   . Thalassemia minor     Past Surgical History:  Procedure Laterality Date  . CHOLECYSTECTOMY    . HIATAL HERNIA REPAIR    . HYSTEROSCOPY     fibroids  . laparoscopy     fibroids    Current Outpatient Prescriptions on File Prior to Visit  Medication Sig Dispense Refill  . aspirin 81 MG tablet Take 81 mg by mouth daily.      . BD INSULIN SYRINGE ULTRAFINE 31G X 15/64" 0.5 ML MISC use twice a day 200 each 2  . bisoprolol (ZEBETA) 5 MG tablet Take 1 tablet (5 mg total) by mouth  daily. 30 tablet 11  . escitalopram (LEXAPRO) 20 MG tablet take 1 tablet by mouth once daily 90 tablet 0  . glucose blood (ONETOUCH VERIO) test strip 1 each by Other route daily. And lancets 1/day 100 each 12  . hydrochlorothiazide (HYDRODIURIL) 12.5 MG tablet take 1 tablet by mouth once daily 90 tablet 0  . insulin NPH Human (HUMULIN N) 100 UNIT/ML injection Inject 45 units in the am and 25 units in the pm 30 mL 4  . Multiple Vitamin (MULITIVITAMIN WITH MINERALS) TABS Take 1 tablet by mouth daily.    . nortriptyline (PAMELOR) 25 MG capsule take 1 capsule by mouth at bedtime 90 capsule 3  . SYNTHROID 150 MCG tablet Take 1 tablet (150 mcg total) by mouth daily. 90 tablet 3  . valsartan (DIOVAN) 160 MG tablet take 1 tablet by mouth once daily 90 tablet 0   No current facility-administered medications on file prior to visit.     Allergies  Allergen Reactions  . Statins Other (See Comments)    Muscle aches    Family History  Problem Relation Age of Onset  . Adopted: Yes  . Healthy Son     x 2  . Headache      Cluster headaches  . Colon cancer Neg Hx   . Pancreatic cancer Neg Hx   . Rectal cancer Neg Hx   . Stomach cancer Neg  Hx     Social History   Social History  . Marital status: Married    Spouse name: N/A  . Number of children: 2  . Years of education: N/A   Occupational History  . fundraising    Social History Main Topics  . Smoking status: Never Smoker  . Smokeless tobacco: Never Used  . Alcohol use No  . Drug use: No  . Sexual activity: Not on file   Other Topics Concern  . Not on file   Social History Narrative   Lives with husband in a one story home.     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   Education: college.    Review of Systems  Constitutional: Negative.   HENT: Negative.   Eyes: Negative.   Respiratory: Negative.   Cardiovascular: Negative.   Gastrointestinal:  Negative.   Genitourinary: Negative.   Musculoskeletal: Positive for back pain (Chronic).  Skin: Negative.   Neurological: Negative.   Endo/Heme/Allergies: Negative.   Psychiatric/Behavioral: Negative.   All other systems reviewed and are negative.   BP (!) 100/56   Temp 98.4 F (36.9 C) (Oral)   Ht 5' 5"  (1.651 m)   Wt 192 lb 6.4 oz (87.3 kg)   BMI 32.02 kg/m   Physical Exam  Constitutional: She is oriented to person, place, and time and well-developed, well-nourished, and in no distress. No distress.  HENT:  Head: Normocephalic and atraumatic.  Right Ear: External ear normal.  Left Ear: External ear normal.  Nose: Nose normal.  Mouth/Throat: Oropharynx is clear and moist. No oropharyngeal exudate.  Eyes: Conjunctivae and EOM are normal. Pupils are equal, round, and reactive to light. Right eye exhibits no discharge. Left eye exhibits no discharge. No scleral icterus.  Neck: Normal range of motion. Neck supple. No JVD present. No tracheal deviation present. No thyromegaly present.  Cardiovascular: Normal rate, regular rhythm and intact distal pulses.  Exam reveals no gallop and no friction rub.   Murmur heard. Pulmonary/Chest: Effort normal and breath sounds normal. No stridor. No respiratory distress. She has no wheezes. She has no rales. She exhibits no tenderness.  Abdominal: Soft. Bowel sounds are normal. She exhibits no distension and no mass. There is no tenderness. There is no rebound and no guarding.  Musculoskeletal: Normal range of motion. She exhibits no edema, tenderness or deformity.  Lymphadenopathy:    She has no cervical adenopathy.  Neurological: She is alert and oriented to person, place, and time. She has normal reflexes. Gait normal. GCS score is 15.  Skin: Skin is warm and dry. No rash noted. She is not diaphoretic. No erythema. No pallor.  Psychiatric: Mood, memory, affect and judgment normal.  Nursing note and vitals reviewed.   Recent Results (from  the past 2160 hour(s))  POCT HgB A1C     Status: None   Collection Time: 02/08/16 11:50 AM  Result Value Ref Range   Hemoglobin A1C 9.2   Cortisol     Status: None   Collection Time: 02/17/16  9:35 AM  Result Value Ref Range   Cortisol, Plasma 0.9 ug/dL    Comment: AM:  4.3 - 22.4 ug/dLPM:  3.1 - 16.7 ug/dL  Fructosamine     Status: Abnormal   Collection Time: 03/08/16 11:43 AM  Result Value Ref Range   Fructosamine 373 (H) 190 - 270 umol/L    Assessment/Plan: 1. Essential hypertension, benign - Controlled with medication.  On the low side today. We'll continue to monitor and consider decreasing medications - Labs tomorrow - Follow-up in one year sooner if needed 2. Controlled type 2 diabetes mellitus without complication, without long-term current use of insulin (HCC) - Controlled. Appears that she has poor diet and does not exercise on a regular basis. - Continue with follow-up with endocrinology  3. Dyslipidemia - Does not seem to be an issue at this time. - Have labs done tomorrow at that time we'll consider if we need to add any medication. She is allergic to statins  4. Class 1 obesity due to excess calories with serious comorbidity and body mass index (BMI) of 32.0 to 32.9 in adult - She needs to start exercising on a regular basis and eating healthy  [name], NP

## 2016-04-13 NOTE — Addendum Note (Signed)
Addended by: Apolinar Junes on: 04/13/2016 07:56 AM   Modules accepted: Orders

## 2016-04-13 NOTE — Addendum Note (Signed)
Addended by: Apolinar Junes on: 04/13/2016 08:01 AM   Modules accepted: Orders

## 2016-04-18 ENCOUNTER — Telehealth: Payer: Self-pay

## 2016-04-18 NOTE — Telephone Encounter (Signed)
Call to schedule AWV LVM for call back to my direct line to schedule an apt [phone]

## 2016-04-20 ENCOUNTER — Telehealth: Payer: Self-pay | Admitting: Endocrinology

## 2016-04-20 NOTE — Telephone Encounter (Signed)
Patient stated the insulin insulin NPH Human (HUMULIN N) 100 UNIT/ML injection  Is not working please advise

## 2016-04-24 NOTE — Telephone Encounter (Signed)
Please verify insulin is 45 units in the am and 25 units in the pm.  Then increase to 55 units in the am and 30 units in the pm.   I'll see you next time.

## 2016-04-24 NOTE — Telephone Encounter (Signed)
I contacted the patient and advised of message via voicemail. Requested a call back if the patient would like to discuss.

## 2016-04-24 NOTE — Telephone Encounter (Signed)
Pt states the insulin is not working -humulin 04/24/16 243 1/1 251 12/31 240 12/30 229

## 2016-04-24 NOTE — Telephone Encounter (Signed)
See message and please advise, Thanks!  

## 2016-05-03 ENCOUNTER — Ambulatory Visit
Admission: RE | Admit: 2016-05-03 | Discharge: 2016-05-03 | Disposition: A | Discharge status: Home / Self Care | Payer: Medicare Other | Source: Ambulatory Visit | Attending: Obstetrics | Admitting: Obstetrics

## 2016-05-03 DIAGNOSIS — Z1231 Encounter for screening mammogram for malignant neoplasm of breast: Secondary | ICD-10-CM

## 2016-05-04 NOTE — Telephone Encounter (Signed)
Call to Ms Oldfield. Agreed to schedule AWV for her and spouse on 1/31 at 10am and 11am

## 2016-05-07 NOTE — Progress Notes (Signed)
Subjective:    Patient ID: Breanna Webster, female    DOB: May 11, 1945, 71 y.o.   MRN: [id number]  HPI Pt returns for f/u of diabetes mellitus: DM type: Insulin-requiring type 2 Dx'ed: 2003.  Complications: none.   Therapy: insulin since 2017.     GDM: never.   DKA: never.  Severe hypoglycemia: never.  Pancreatitis: never.  Other: she stopped metformin-XR due to nausea and diarrhea; pioglitizone is precluded by edema.  she takes human insulin, due to cost.  Interval history: She takes 50 units qam and 35 units qpm.  she brings a record of her cbg's which i have reviewed today.  It varies from 148-300's.   Past Medical History:  Diagnosis Date  . Arthritis   . Carotid artery stenosis    Mild  . Colon polyps   . Diabetes mellitus   . Diverticulitis   . Diverticulosis   . GERD (gastroesophageal reflux disease)   . Grave's disease   . Hyperlipidemia   . Hypertension   . IBS (irritable bowel syndrome)   . Osteopenia   . Thalassemia minor     Past Surgical History:  Procedure Laterality Date  . CHOLECYSTECTOMY    . HIATAL HERNIA REPAIR    . HYSTEROSCOPY     fibroids  . laparoscopy     fibroids    Social History   Social History  . Marital status: Married    Spouse name: N/A  . Number of children: 2  . Years of education: N/A   Occupational History  . fundraising    Social History Main Topics  . Smoking status: Never Smoker  . Smokeless tobacco: Never Used  . Alcohol use No  . Drug use: No  . Sexual activity: Not on file   Other Topics Concern  . Not on file   Social History Narrative   Lives with husband in a one story home.     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   Education: college.    Current Outpatient Prescriptions on File Prior to Visit  Medication Sig Dispense Refill  . aspirin 81 MG tablet Take 81 mg by mouth daily.      . BD INSULIN SYRINGE ULTRAFINE 31G X 15/64"  0.5 ML MISC use twice a day 200 each 2  . bisoprolol (ZEBETA) 5 MG tablet Take 1 tablet (5 mg total) by mouth daily. 30 tablet 11  . escitalopram (LEXAPRO) 20 MG tablet Take 1 tablet (20 mg total) by mouth daily. 90 tablet 1  . glucose blood (ONETOUCH VERIO) test strip 1 each by Other route daily. And lancets 1/day 100 each 12  . hydrochlorothiazide (HYDRODIURIL) 12.5 MG tablet Take 1 tablet (12.5 mg total) by mouth daily. 90 tablet 3  . Multiple Vitamin (MULITIVITAMIN WITH MINERALS) TABS Take 1 tablet by mouth daily.    . nortriptyline (PAMELOR) 25 MG capsule Take 1 capsule (25 mg total) by mouth at bedtime. 90 capsule 3  . SYNTHROID 150 MCG tablet Take 1 tablet (150 mcg total) by mouth daily. 90 tablet 3  . valsartan (DIOVAN) 160 MG tablet Take 1 tablet (160 mg total) by mouth daily. 90 tablet 3   No current facility-administered medications on file prior to visit.     Allergies  Allergen Reactions  . Statins Other (See Comments)    Muscle aches    Family History  Problem Relation Age of Onset  .  Adopted: Yes  . Healthy Son     x 2  . Headache      Cluster headaches  . Colon cancer Neg Hx   . Pancreatic cancer Neg Hx   . Rectal cancer Neg Hx   . Stomach cancer Neg Hx     BP 122/80   Pulse 69   Ht 5' 5"  (1.651 m)   Wt 193 lb (87.5 kg)   SpO2 95%   BMI 32.12 kg/m    Review of Systems She denies hypoglycemia.  She has lost a few lbs.     Objective:   Physical Exam VITAL SIGNS:  See vs page GENERAL: no distress Pulses: dorsalis pedis intact bilat.   MSK: no deformity of the feet CV: no leg edema Skin:  no ulcer on the feet.  normal color and temp on the feet. Neuro: sensation is intact to touch on the feet.    Lab Results  Component Value Date   HGBA1C 9.5 05/08/2016      Assessment & Plan:  Type 2 DM: she needs increased rx.    Patient is advised the following: Patient Instructions  check your blood sugar once a day.  vary the time of day when you check,  between before the 3 meals, and at bedtime.  also check if you have symptoms of your blood sugar being too high or too low.  please keep a record of the readings and bring it to your next appointment here (or you can bring the meter itself).  You can write it on any piece of paper.  please call us sooner if your blood sugar goes below 70, or if you have a lot of readings over 200.  Please increase the insulin to 70 units each morning and 40 units each evening.   Please call or message [name] next week, to tell us how the blood sugar is doing.   Please come back for a follow-up appointment in 2 months.

## 2016-05-08 ENCOUNTER — Other Ambulatory Visit: Payer: Self-pay

## 2016-05-08 ENCOUNTER — Ambulatory Visit (INDEPENDENT_AMBULATORY_CARE_PROVIDER_SITE_OTHER): Payer: Medicare Other | Admitting: Endocrinology

## 2016-05-08 ENCOUNTER — Telehealth: Payer: Self-pay | Admitting: Endocrinology

## 2016-05-08 ENCOUNTER — Encounter: Payer: Self-pay | Admitting: Endocrinology

## 2016-05-08 VITALS — BP 122/80 | HR 69 | Ht 65.0 in | Wt 193.0 lb

## 2016-05-08 DIAGNOSIS — E119 Type 2 diabetes mellitus without complications: Secondary | ICD-10-CM | POA: Diagnosis not present

## 2016-05-08 LAB — POCT GLYCOSYLATED HEMOGLOBIN (HGB A1C): Hemoglobin A1C: 9.5

## 2016-05-08 MED ORDER — INSULIN NPH (HUMAN) (ISOPHANE) 100 UNIT/ML ~~LOC~~ SUSP: SUBCUTANEOUS | 2 refills | Status: DC

## 2016-05-08 MED ORDER — INSULIN NPH (HUMAN) (ISOPHANE) 100 UNIT/ML ~~LOC~~ SUSP: SUBCUTANEOUS | 11 refills | Status: DC

## 2016-05-08 NOTE — Patient Instructions (Addendum)
check your blood sugar once a day.  vary the time of day when you check, between before the 3 meals, and at bedtime.  also check if you have symptoms of your blood sugar being too high or too low.  please keep a record of the readings and bring it to your next appointment here (or you can bring the meter itself).  You can write it on any piece of paper.  please call us sooner if your blood sugar goes below 70, or if you have a lot of readings over 200.  Please increase the insulin to 70 units each morning and 40 units each evening.   Please call or message [name] next week, to tell us how the blood sugar is doing.   Please come back for a follow-up appointment in 2 months.

## 2016-05-08 NOTE — Telephone Encounter (Signed)
Refill submitted. 

## 2016-05-08 NOTE — Telephone Encounter (Signed)
Pt needs humulin n called in to the [facility] on [address] please

## 2016-05-14 ENCOUNTER — Telehealth: Payer: Self-pay | Admitting: Endocrinology

## 2016-05-14 MED ORDER — GLUCOSE BLOOD VI STRP: 1.0000 | ORAL_STRIP | Freq: Every day | 12 refills | Status: DC

## 2016-05-14 NOTE — Telephone Encounter (Signed)
Refill for test strips submitted. And patient and advised of new instructions. Patient voiced understanding and had no further questions at this time.

## 2016-05-14 NOTE — Telephone Encounter (Signed)
See message and please advise, Thanks!  

## 2016-05-14 NOTE — Telephone Encounter (Signed)
Please increase the insulin to 80 units each morning and 50 units each evening.  Please call in a few days, to report cbg's. This will get better.  We just have to find the right amount.

## 2016-05-14 NOTE — Telephone Encounter (Signed)
Nothing has changed even with the medication adjustment

## 2016-05-14 NOTE — Telephone Encounter (Signed)
[facility] is waiting on ok for the test strips

## 2016-05-15 ENCOUNTER — Encounter: Payer: Self-pay | Admitting: Adult Health

## 2016-05-22 NOTE — Progress Notes (Signed)
Subjective:   Breanna Webster is a 71 y.o. female who presents for Medicare Annual (Subsequent) preventive examination.  The Patient was informed that the wellness visit is to identify future health risk and educate and initiate measures that can reduce risk for increased disease through the lifespan.    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 Good But very tired  The following written screening schedule of preventive measures were reviewed with assessment and plan made per below and patient instructions:  Smoking history reviewed - never smoked  Use of Smokeless tobacco  Second Hand Smoke status; No Smokers in the home  ETOH NO  RISK FACTORS Regular exercise-  Walks x 3 times a week 30 minutes Agreed to bump up to 30    Diet; Seeing RD  Goes to Dr. Loanne Drilling Both states their diet is very good;   incorporates fruits and vegetables; YES Barriers noted and given weight loss strategies as indicated  Obesity and weight loss plan discussed  Decided during the course of this assessment to go back to Weight Watchers;  Lipids reviewed and reducing cholesterol discussed  Chol 159; HDL 42; LDL 96; trig 104/  Running high at 9.5   Other ISSUES: Sleeping a lot  Was a multi tasker and now states she has no energy  Taking naps in the afternoon  Spouse states she goes to sleep during the day;  She want wake up during the day unless someone wakes her; she will sleep for 3 to 4 hours Can't finish reading; watching TV without dozing off She will go to sleep in the car Being more lathargic Goes to bed 1:30am and up lunch Since she stopped working x 19 years  Denies sleep apnea as spouse states she does not snore;  Educated as to sleep rhythms and may benefit from trying to go to sleep when  tired and before 12 mn.  Apt with Tommi Rumps next Tuesday and to evaluated for sleep disturbance or other  Also states "May sleep to stop her mind and get out of her head"  States her brain never stops working     Fall risk: presented mobile; get up and go WNL  Mobility of Functional changes this year? No      DM eye exam 01/2016   Hearing Screening   125Hz  250Hz  500Hz  1000Hz  2000Hz  3000Hz  4000Hz  6000Hz  8000Hz   Right ear:       100    Left ear:       100    Vision Screening Comments: Vision checks q 6   Months  No retinopathy    Depression Screen To note; the couple quarreled in the assessment;  Referred to counseling; Spouse does not feel this will help;  (had some recall issues but does not feel like he needs to have the medically reviewed; Spouse is adamant about him being dx. Agrees she can uses counseling but does not know if she can afford it.  Noted there may be some depression causing symptoms as well but due to both being present at this assessment; referred to [facility] and perhaps [place], neurology or other to evaluate issues Depression scale waived as it would not have been helpful at this time.   Activities of Daily Living - See functional screen   Cognitive  testing; recall good Manages the household, apts, bill, meds and a overwhelmed with insulin; again, would benefit from sleep hygiene and sleep  Ad8 score; 0 or less than 2  MMSE deferred or completed if AD8 + 2 issues  Advanced Directives reviewed for completion; discussion with MD as well as supportive resources as needed They are in process of this   Patient Care Team: [name], NP as PCP - General (Family Medicine)   Preventives screens reviewed Colonoscopy 07/2013 to repeat 07/2016 Mammogram  04/2016 Dexa 02/2015 -1.6 Did not discuss today due to time and other issues      Immunization History  Administered Date(s) Administered  . Hep A / Hep B 06/15/2015, 07/15/2015, 12/27/2015    . Influenza Split 01/16/2012  . Influenza Whole 01/31/2010  . Influenza, High Dose Seasonal PF 01/28/2014, 12/29/2014, 02/17/2016  . Influenza-Unspecified 12/29/2012  . Pneumococcal Polysaccharide-23 01/28/2012, 01/28/2014  . Pneumococcal-Unspecified 04/24/2015  . Tdap 12/29/2014  . Zoster 02/14/2012   Required Immunizations needed today  Screening test up to date or reviewed for plan of completion Health Maintenance Due  Topic Date Due  . PNA vac Low Risk Adult (2 of 2 - PCV13) 01/29/2015    The patient has 2 PSV 23 documented; one in 01/2012 and the other in 01/2014; no md apt but rec'd flu and pneumonia in 2015;   The patient confirms she had Prevnar and had it this past October so it was taken out of epic     Objective:     Vitals: BP 102/60   Pulse 68   Ht 5' 5"  (1.651 m)   Wt 195 lb 4 oz (88.6 kg)   SpO2 94%   BMI 32.49 kg/m   Body mass index is 32.49 kg/m.   Tobacco History  Smoking Status  . Never Smoker  Smokeless Tobacco  . Never Used     Counseling given: Yes   Past Medical History:  Diagnosis Date  . Arthritis   . Carotid artery stenosis    Mild  . Colon polyps   . Diabetes mellitus   . Diverticulitis   . Diverticulosis   . GERD (gastroesophageal reflux disease)   . Grave's disease   . Hyperlipidemia   . Hypertension   . IBS (irritable bowel syndrome)   . Osteopenia   . Thalassemia minor    Past Surgical History:  Procedure Laterality Date  . CHOLECYSTECTOMY    . HIATAL HERNIA REPAIR    . HYSTEROSCOPY     fibroids  . laparoscopy     fibroids   Family History  Problem Relation Age of Onset  . Adopted: Yes  . Healthy Son     x 2  . Headache      Cluster headaches  . Colon cancer Neg Hx   . Pancreatic cancer Neg Hx   . Rectal cancer Neg Hx   . Stomach cancer Neg Hx    History  Sexual Activity  . Sexual activity: Not on file    Outpatient Encounter Prescriptions as of 05/23/2016  Medication Sig  . aspirin 81 MG tablet  Take 81 mg by mouth daily.    . BD INSULIN SYRINGE ULTRAFINE 31G X 15/64" 0.5 ML MISC use twice a day  . bisoprolol (ZEBETA) 5 MG tablet Take 1 tablet (5 mg total) by mouth daily.  Marland Kitchen escitalopram (LEXAPRO) 20 MG tablet Take 1 tablet (20 mg total) by mouth daily.  Marland Kitchen glucose blood (ONETOUCH VERIO) test strip 1 each  by Other route daily. And lancets 1/day  . hydrochlorothiazide (HYDRODIURIL) 12.5 MG tablet Take 1 tablet (12.5 mg total) by mouth daily.  . insulin NPH Human (NOVOLIN N RELION) 100 UNIT/ML injection 70 units each morning and 40 units each evening.  . Multiple Vitamin (MULITIVITAMIN WITH MINERALS) TABS Take 1 tablet by mouth daily.  . nortriptyline (PAMELOR) 25 MG capsule Take 1 capsule (25 mg total) by mouth at bedtime.  Marland Kitchen SYNTHROID 150 MCG tablet Take 1 tablet (150 mcg total) by mouth daily.  . valsartan (DIOVAN) 160 MG tablet Take 1 tablet (160 mg total) by mouth daily.   No facility-administered encounter medications on file as of 05/23/2016.     Activities of Daily Living In your present state of health, do you have any difficulty performing the following activities: 05/23/2016  Hearing? N  Vision? N  Difficulty concentrating or making decisions? N  Walking or climbing stairs? N  Dressing or bathing? N  Doing errands, shopping? N  Preparing Food and eating ? N  Using the Toilet? N  In the past six months, have you accidently leaked urine? N  Do you have problems with loss of bowel control? N  Managing your Medications? N  Managing your Finances? N  Housekeeping or managing your Housekeeping? N  Some recent data might be hidden    Patient Care Team: [name], NP as PCP - General (Family Medicine)    Assessment:     Exercise Activities and Dietary recommendations Current Exercise Habits: Home exercise routine, Type of exercise: walking, Time (Minutes): 30, Frequency (Times/Week): 5 (agreed to go from 3 to 5 days a week ), Weekly Exercise (Minutes/Week):  150  Goals    . exercise          Start walking 5 days a week     . Weight (lb) < 180 lb (81.6 kg)          Go back to weight watchers  Weight watchers has a app themselves  Check out  online nutrition programs as GumSearch.nl and http://vang.com/; fit64m; Look for foods with "whole" wheat; bran; oatmeal etc Shot at the farmer's markets in season for fresher choices  Watch for "hydrogenated" on the label of oils which are trans-fats.  Watch for "high fructose corn syrup" in snacks, yogurt or ketchup  Meats have less marbling; bright colored fruits and vegetables;  Canned; dump out liquid and wash vegetables. Be mindful of what we are eating  Portion control is essential to a health weight! Sit down; take a break and enjoy your meal; take smaller bites; put the fork down between bites;  It takes 20 minutes to get full; so check in with your fullness cues and stop eating when you start to fill full             Fall Risk Fall Risk  05/23/2016 04/02/2016 05/27/2015 03/29/2015 03/29/2015  Falls in the past year? No No Yes No No  Number falls in past yr: - - 1 - -  Injury with Fall? - - No - -  Follow up - - Falls evaluation completed - -   Depression Screen PHQ 2/9 Scores 04/02/2016 03/29/2015 03/29/2015 02/24/2014  PHQ - 2 Score 0 0 0 0           Immunization History  Administered Date(s) Administered  . Hep A / Hep B 06/15/2015, 07/15/2015, 12/27/2015  . Influenza Split 01/16/2012  . Influenza Whole 01/31/2010  . Influenza, High Dose Seasonal PF  01/28/2014, 12/29/2014, 02/17/2016  . Influenza-Unspecified 12/29/2012  . Pneumococcal Polysaccharide-23 01/28/2012, 01/28/2014  . Pneumococcal-Unspecified 04/24/2015  . Tdap 12/29/2014  . Zoster 02/14/2012   Screening Tests Health Maintenance  Topic Date Due  . PNA vac Low Risk Adult (2 of 2 - PCV13) 01/29/2015  . COLONOSCOPY  08/06/2016  . HEMOGLOBIN A1C  11/05/2016  . OPHTHALMOLOGY EXAM  02/14/2017  . FOOT EXAM   03/08/2017  . MAMMOGRAM  05/03/2018  . TETANUS/TDAP  12/28/2024  . INFLUENZA VACCINE  Completed  . DEXA SCAN  Completed  . ZOSTAVAX  Completed  . Hepatitis C Screening  Completed      Plan:     1. To see Talbert Forest to fup on sleep issues for evaluation or referral Educated on sleep can interfere with insulin and management of her DM  2. Admits to being overwhelmed and angry; Agreed to have counseling if she can afford it. Recommended she explore her benefit and will defer to [facility] for recommendation on Counselor; Mentioned Dr. Glennon Hamilton or other in lieu of her issues and spouse, whom she feels has memory issues;   3; removed prevnar from epic  5 agreed to walk 5 days a week and decided she would go back to weight watchers.  During the course of the visit the patient was educated and counseled about the following appropriate screening and preventive services:   Vaccines to include Pneumoccal, Influenza, Hepatitis B, Td, Zostavax, HCV  Electrocardiogram  Cardiovascular Disease  Colorectal cancer screening  Bone density screening; osteopenoia  Diabetes screening / per Dr. Loanne Drilling  Glaucoma screening eye exam q 6 months  Mammography/coompleted this year   Nutrition counseling - reviewed   Patient Instructions (the written plan) was given to the patient.   Wynetta Fines, RN  05/23/2016

## 2016-05-23 ENCOUNTER — Ambulatory Visit (INDEPENDENT_AMBULATORY_CARE_PROVIDER_SITE_OTHER): Payer: Medicare Other

## 2016-05-23 ENCOUNTER — Telehealth: Payer: Self-pay

## 2016-05-23 VITALS — BP 102/60 | HR 68 | Ht 65.0 in | Wt 195.2 lb

## 2016-05-23 DIAGNOSIS — Z0000 Encounter for general adult medical examination without abnormal findings: Secondary | ICD-10-CM | POA: Diagnosis not present

## 2016-05-23 NOTE — Patient Instructions (Addendum)
Breanna Webster , Thank you for taking time to come for your Medicare Wellness Visit. I appreciate your ongoing commitment to your health goals. Please review the following plan we discussed and let me know if I can assist you in the future.   Will discuss sleep issues with cory  Will consider counseling if you can find one affordable Discussed Dr Glennon Hamilton but will defer to [name] to recommend   Continue to try and exercise  Good luck weight watcher   These are the goals we discussed: Goals    . exercise          Start walking 5 days a week     . Weight (lb) < 180 lb (81.6 kg)          Go back to weight watchers  Weight watchers has a app themselves  Check out  online nutrition programs as GumSearch.nl and http://vang.com/; fit21m; Look for foods with "whole" wheat; bran; oatmeal etc Shot at the farmer's markets in season for fresher choices  Watch for "hydrogenated" on the label of oils which are trans-fats.  Watch for "high fructose corn syrup" in snacks, yogurt or ketchup  Meats have less marbling; bright colored fruits and vegetables;  Canned; dump out liquid and wash vegetables. Be mindful of what we are eating  Portion control is essential to a health weight! Sit down; take a break and enjoy your meal; take smaller bites; put the fork down between bites;  It takes 20 minutes to get full; so check in with your fullness cues and stop eating when you start to fill full              This is a list of the screening recommended for you and due dates:  Health Maintenance  Topic Date Due  . Pneumonia vaccines (2 of 2 - PCV13) 01/29/2015  . Colon Cancer Screening  08/06/2016  . Hemoglobin A1C  11/05/2016  . Eye exam for diabetics  02/14/2017  . Complete foot exam   03/08/2017  . Mammogram  05/03/2018  . Tetanus Vaccine  12/28/2024  . Flu Shot  Completed  . DEXA scan (bone density measurement)  Completed  . Shingles Vaccine  Completed  .  Hepatitis C: One time  screening is recommended by Center for Disease Control  (CDC) for  adults born from [birth year]through 1967.   Completed        Fall Prevention in the Home Introduction Falls can cause injuries. They can happen to people of all ages. There are many things you can do to make your home safe and to help prevent falls. What can I do on the outside of my home?  Regularly fix the edges of walkways and driveways and fix any cracks.  Remove anything that might make you trip as you walk through a door, such as a raised step or threshold.  Trim any bushes or trees on the path to your home.  Use bright outdoor lighting.  Clear any walking paths of anything that might make someone trip, such as rocks or tools.  Regularly check to see if handrails are loose or broken. Make sure that both sides of any steps have handrails.  Any raised decks and porches should have guardrails on the edges.  Have any leaves, snow, or ice cleared regularly.  Use sand or salt on walking paths during winter.  Clean up any spills in your garage right away. This includes oil or grease spills. What can I  do in the bathroom?  Use night lights.  Install grab bars by the toilet and in the tub and shower. Do not use towel bars as grab bars.  Use non-skid mats or decals in the tub or shower.  If you need to sit down in the shower, use a plastic, non-slip stool.  Keep the floor dry. Clean up any water that spills on the floor as soon as it happens.  Remove soap buildup in the tub or shower regularly.  Attach bath mats securely with double-sided non-slip rug tape.  Do not have throw rugs and other things on the floor that can make you trip. What can I do in the bedroom?  Use night lights.  Make sure that you have a light by your bed that is easy to reach.  Do not use any sheets or blankets that are too big for your bed. They should not hang down onto the floor.  Have a firm chair that has side arms. You can use  this for support while you get dressed.  Do not have throw rugs and other things on the floor that can make you trip. What can I do in the kitchen?  Clean up any spills right away.  Avoid walking on wet floors.  Keep items that you use a lot in easy-to-reach places.  If you need to reach something above you, use a strong step stool that has a grab bar.  Keep electrical cords out of the way.  Do not use floor polish or wax that makes floors slippery. If you must use wax, use non-skid floor wax.  Do not have throw rugs and other things on the floor that can make you trip. What can I do with my stairs?  Do not leave any items on the stairs.  Make sure that there are handrails on both sides of the stairs and use them. Fix handrails that are broken or loose. Make sure that handrails are as long as the stairways.  Check any carpeting to make sure that it is firmly attached to the stairs. Fix any carpet that is loose or worn.  Avoid having throw rugs at the top or bottom of the stairs. If you do have throw rugs, attach them to the floor with carpet tape.  Make sure that you have a light switch at the top of the stairs and the bottom of the stairs. If you do not have them, ask someone to add them for you. What else can I do to help prevent falls?  Wear shoes that:  Do not have high heels.  Have rubber bottoms.  Are comfortable and fit you well.  Are closed at the toe. Do not wear sandals.  If you use a stepladder:  Make sure that it is fully opened. Do not climb a closed stepladder.  Make sure that both sides of the stepladder are locked into place.  Ask someone to hold it for you, if possible.  Clearly mark and make sure that you can see:  Any grab bars or handrails.  First and last steps.  Where the edge of each step is.  Use tools that help you move around (mobility aids) if they are needed. These include:  Canes.  Walkers.  Scooters.  Crutches.  Turn on  the lights when you go into a dark area. Replace any light bulbs as soon as they burn out.  Set up your furniture so you have a clear path. Avoid moving your  furniture around.  If any of your floors are uneven, fix them.  If there are any pets around you, be aware of where they are.  Review your medicines with your doctor. Some medicines can make you feel dizzy. This can increase your chance of falling. Ask your doctor what other things that you can do to help prevent falls. This information is not intended to replace advice given to you by your health care provider. Make sure you discuss any questions you have with your health care provider. Document Released: 02/03/2009 Document Revised: 09/15/2015 Document Reviewed: 05/14/2014  2017 Elsevier  Health Maintenance, Female Introduction Adopting a healthy lifestyle and getting preventive care can go a long way to promote health and wellness. Talk with your health care provider about what schedule of regular examinations is right for you. This is a good chance for you to check in with your provider about disease prevention and staying healthy. In between checkups, there are plenty of things you can do on your own. Experts have done a lot of research about which lifestyle changes and preventive measures are most likely to keep you healthy. Ask your health care provider for more information. Weight and diet Eat a healthy diet  Be sure to include plenty of vegetables, fruits, low-fat dairy products, and lean protein.  Do not eat a lot of foods high in solid fats, added sugars, or salt.  Get regular exercise. This is one of the most important things you can do for your health.  Most adults should exercise for at least 150 minutes each week. The exercise should increase your heart rate and make you sweat (moderate-intensity exercise).  Most adults should also do strengthening exercises at least twice a week. This is in addition to the  moderate-intensity exercise. Maintain a healthy weight  Body mass index (BMI) is a measurement that can be used to identify possible weight problems. It estimates body fat based on height and weight. Your health care provider can help determine your BMI and help you achieve or maintain a healthy weight.  For females 38 years of age and older:  A BMI below 18.5 is considered underweight.  A BMI of 18.5 to 24.9 is normal.  A BMI of 25 to 29.9 is considered overweight.  A BMI of 30 and above is considered obese. Watch levels of cholesterol and blood lipids  You should start having your blood tested for lipids and cholesterol at 73 years of age, then have this test every 5 years.  You may need to have your cholesterol levels checked more often if:  Your lipid or cholesterol levels are high.  You are older than 73 years of age.  You are at high risk for heart disease. Cancer screening Lung Cancer  Lung cancer screening is recommended for adults 64-47 years old who are at high risk for lung cancer because of a history of smoking.  A yearly low-dose CT scan of the lungs is recommended for people who:  Currently smoke.  Have quit within the past 15 years.  Have at least a 30-pack-year history of smoking. A pack year is smoking an average of one pack of cigarettes a day for 1 year.  Yearly screening should continue until it has been 15 years since you quit.  Yearly screening should stop if you develop a health problem that would prevent you from having lung cancer treatment. Breast Cancer  Practice breast self-awareness. This means understanding how your breasts normally appear and feel.  It also means doing regular breast self-exams. Let your health care provider know about any changes, no matter how small.  If you are in your 20s or 30s, you should have a clinical breast exam (CBE) by a health care provider every 1-3 years as part of a regular health exam.  If you are 33 or  older, have a CBE every year. Also consider having a breast X-ray (mammogram) every year.  If you have a family history of breast cancer, talk to your health care provider about genetic screening.  If you are at high risk for breast cancer, talk to your health care provider about having an MRI and a mammogram every year.  Breast cancer gene (BRCA) assessment is recommended for women who have family members with BRCA-related cancers. BRCA-related cancers include:  Breast.  Ovarian.  Tubal.  Peritoneal cancers.  Results of the assessment will determine the need for genetic counseling and BRCA1 and BRCA2 testing. Cervical Cancer  Your health care provider may recommend that you be screened regularly for cancer of the pelvic organs (ovaries, uterus, and vagina). This screening involves a pelvic examination, including checking for microscopic changes to the surface of your cervix (Pap test). You may be encouraged to have this screening done every 3 years, beginning at age 56.  For women ages 13-65, health care providers may recommend pelvic exams and Pap testing every 3 years, or they may recommend the Pap and pelvic exam, combined with testing for human papilloma virus (HPV), every 5 years. Some types of HPV increase your risk of cervical cancer. Testing for HPV may also be done on women of any age with unclear Pap test results.  Other health care providers may not recommend any screening for nonpregnant women who are considered low risk for pelvic cancer and who do not have symptoms. Ask your health care provider if a screening pelvic exam is right for you.  If you have had past treatment for cervical cancer or a condition that could lead to cancer, you need Pap tests and screening for cancer for at least 20 years after your treatment. If Pap tests have been discontinued, your risk factors (such as having a new sexual partner) need to be reassessed to determine if screening should resume. Some  women have medical problems that increase the chance of getting cervical cancer. In these cases, your health care provider may recommend more frequent screening and Pap tests. Colorectal Cancer  This type of cancer can be detected and often prevented.  Routine colorectal cancer screening usually begins at 73 years of age and continues through 73 years of age.  Your health care provider may recommend screening at an earlier age if you have risk factors for colon cancer.  Your health care provider may also recommend using home test kits to check for hidden blood in the stool.  A small camera at the end of a tube can be used to examine your colon directly (sigmoidoscopy or colonoscopy). This is done to check for the earliest forms of colorectal cancer.  Routine screening usually begins at age 57.  Direct examination of the colon should be repeated every 5-10 years through 73 years of age. However, you may need to be screened more often if early forms of precancerous polyps or small growths are found. Skin Cancer  Check your skin from head to toe regularly.  Tell your health care provider about any new moles or changes in moles, especially if there is a change in  a mole's shape or color.  Also tell your health care provider if you have a mole that is larger than the size of a pencil eraser.  Always use sunscreen. Apply sunscreen liberally and repeatedly throughout the day.  Protect yourself by wearing long sleeves, pants, a wide-brimmed hat, and sunglasses whenever you are outside. Heart disease, diabetes, and high blood pressure  High blood pressure causes heart disease and increases the risk of stroke. High blood pressure is more likely to develop in:  People who have blood pressure in the high end of the normal range (130-139/85-89 mm Hg).  People who are overweight or obese.  People who are African American.  If you are 29-14 years of age, have your blood pressure checked every  3-5 years. If you are 54 years of age or older, have your blood pressure checked every year. You should have your blood pressure measured twice-once when you are at a hospital or clinic, and once when you are not at a hospital or clinic. Record the average of the two measurements. To check your blood pressure when you are not at a hospital or clinic, you can use:  An automated blood pressure machine at a pharmacy.  A home blood pressure monitor.  If you are between 50 years and 71 years old, ask your health care provider if you should take aspirin to prevent strokes.  Have regular diabetes screenings. This involves taking a blood sample to check your fasting blood sugar level.  If you are at a normal weight and have a low risk for diabetes, have this test once every three years after 73 years of age.  If you are overweight and have a high risk for diabetes, consider being tested at a younger age or more often. Preventing infection Hepatitis B  If you have a higher risk for hepatitis B, you should be screened for this virus. You are considered at high risk for hepatitis B if:  You were born in a country where hepatitis B is common. Ask your health care provider which countries are considered high risk.  Your parents were born in a high-risk country, and you have not been immunized against hepatitis B (hepatitis B vaccine).  You have HIV or AIDS.  You use needles to inject street drugs.  You live with someone who has hepatitis B.  You have had sex with someone who has hepatitis B.  You get hemodialysis treatment.  You take certain medicines for conditions, including cancer, organ transplantation, and autoimmune conditions. Hepatitis C  Blood testing is recommended for:  Everyone born from [birth year] through 1967.  Anyone with known risk factors for hepatitis C. Sexually transmitted infections (STIs)  You should be screened for sexually transmitted infections (STIs) including  gonorrhea and chlamydia if:  You are sexually active and are younger than 73 years of age.  You are older than 73 years of age and your health care provider tells you that you are at risk for this type of infection.  Your sexual activity has changed since you were last screened and you are at an increased risk for chlamydia or gonorrhea. Ask your health care provider if you are at risk.  If you do not have HIV, but are at risk, it may be recommended that you take a prescription medicine daily to prevent HIV infection. This is called pre-exposure prophylaxis (PrEP). You are considered at risk if:  You are sexually active and do not regularly use condoms or know the  HIV status of your partner(s).  You take drugs by injection.  You are sexually active with a partner who has HIV. Talk with your health care provider about whether you are at high risk of being infected with HIV. If you choose to begin PrEP, you should first be tested for HIV. You should then be tested every 3 months for as long as you are taking PrEP. Pregnancy  If you are premenopausal and you may become pregnant, ask your health care provider about preconception counseling.  If you may become pregnant, take 400 to 800 micrograms (mcg) of folic acid every day.  If you want to prevent pregnancy, talk to your health care provider about birth control (contraception). Osteoporosis and menopause  Osteoporosis is a disease in which the bones lose minerals and strength with aging. This can result in serious bone fractures. Your risk for osteoporosis can be identified using a bone density scan.  If you are 51 years of age or older, or if you are at risk for osteoporosis and fractures, ask your health care provider if you should be screened.  Ask your health care provider whether you should take a calcium or vitamin D supplement to lower your risk for osteoporosis.  Menopause may have certain physical symptoms and risks.  Hormone  replacement therapy may reduce some of these symptoms and risks. Talk to your health care provider about whether hormone replacement therapy is right for you. Follow these instructions at home:  Schedule regular health, dental, and eye exams.  Stay current with your immunizations.  Do not use any tobacco products including cigarettes, chewing tobacco, or electronic cigarettes.  If you are pregnant, do not drink alcohol.  If you are breastfeeding, limit how much and how often you drink alcohol.  Limit alcohol intake to no more than 1 drink per day for nonpregnant women. One drink equals 12 ounces of beer, 5 ounces of wine, or 1 ounces of hard liquor.  Do not use street drugs.  Do not share needles.  Ask your health care provider for help if you need support or information about quitting drugs.  Tell your health care provider if you often feel depressed.  Tell your health care provider if you have ever been abused or do not feel safe at home. This information is not intended to replace advice given to you by your health care provider. Make sure you discuss any questions you have with your health care provider. Document Released: 10/23/2010 Document Revised: 09/15/2015 Document Reviewed: 01/11/2015  2017 Elsevier

## 2016-05-23 NOTE — Progress Notes (Signed)
I have reviewed and agree with this plan  

## 2016-05-23 NOTE — Telephone Encounter (Signed)
Patient called to report blood sugar readings. Patient stated her fasting blood sugars have been ranging between 88-261. Evening blood sugar are not going below 180. Patient confirmed she is taking 100 units of Novolin N in the AM and 50 units in the PM. Please advise, Thanks!

## 2016-05-23 NOTE — Telephone Encounter (Signed)
Please change to 120 units in the AM and 30 units in the PM.  We may need to adjust further, so don't hesitate to call back.  I'll see you next time.

## 2016-05-23 NOTE — Telephone Encounter (Signed)
I contacted the patient and advised of message via voicemail. Requested a call back if the patient would like to discuss further.

## 2016-05-24 ENCOUNTER — Telehealth: Payer: Self-pay | Admitting: Endocrinology

## 2016-05-24 NOTE — Telephone Encounter (Signed)
Requested a call back to further discus.

## 2016-05-24 NOTE — Telephone Encounter (Signed)
Is it the dosage, or do they want an alternative?

## 2016-05-24 NOTE — Telephone Encounter (Signed)
Attempted to reach [facility]. Was on hold for 5 + minuets. Will try again at a later time .

## 2016-05-24 NOTE — Telephone Encounter (Signed)
Forwarded note

## 2016-05-24 NOTE — Telephone Encounter (Signed)
[facility] called has question about patients insulin Humilin. [phone]  Case # [id number]

## 2016-05-25 NOTE — Telephone Encounter (Signed)
BS reading from yesterday AM 88; 211 last night and 80 this AM  Pt is also saying she needs the test strips called into [facility] please they are stating they have not received the rx we sent

## 2016-05-28 MED ORDER — GLUCOSE BLOOD VI STRP: 1.0000 | ORAL_STRIP | Freq: Every day | 12 refills | Status: DC

## 2016-05-28 MED ORDER — INSULIN NPH (HUMAN) (ISOPHANE) 100 UNIT/ML ~~LOC~~ SUSP: SUBCUTANEOUS | 2 refills | Status: DC

## 2016-05-28 NOTE — Telephone Encounter (Signed)
I attempted the reach [facility] and was on hold for 15 plus mins and no one answered. I contacted [facility] on [address] and was advised the last rx for the novolin R that was submitted was covered by insurance. Since the last rx was submitted the patient's dosage has changed. Rx updated and submitted to [facility].

## 2016-05-28 NOTE — Telephone Encounter (Signed)
Please verify insulin is 120 units in the AM and 30 units in the PM. Then please change to 130 units in the AM and 20 units in the PM. I'll see you next time.

## 2016-05-28 NOTE — Telephone Encounter (Signed)
I contacted the patient and advised of message via voicemail. Requested a call back if the patient would like to discuss further.

## 2016-05-28 NOTE — Telephone Encounter (Signed)
See message and please advise. Refill for test strips have been submitted.

## 2016-05-29 ENCOUNTER — Ambulatory Visit (INDEPENDENT_AMBULATORY_CARE_PROVIDER_SITE_OTHER): Payer: Medicare Other | Admitting: Adult Health

## 2016-05-29 ENCOUNTER — Encounter: Payer: Self-pay | Admitting: Adult Health

## 2016-05-29 VITALS — BP 124/62 | Temp 98.1°F | Ht 65.0 in | Wt 197.0 lb

## 2016-05-29 DIAGNOSIS — R5383 Other fatigue: Secondary | ICD-10-CM | POA: Diagnosis not present

## 2016-05-29 DIAGNOSIS — E559 Vitamin D deficiency, unspecified: Secondary | ICD-10-CM

## 2016-05-29 DIAGNOSIS — D563 Thalassemia minor: Secondary | ICD-10-CM | POA: Diagnosis not present

## 2016-05-29 LAB — CBC WITH DIFFERENTIAL/PLATELET
Basophils Absolute: 0.1 10*3/uL (ref 0.0–0.1)
Basophils Relative: 1.1 % (ref 0.0–3.0)
Eosinophils Absolute: 0.2 10*3/uL (ref 0.0–0.7)
Eosinophils Relative: 2.8 % (ref 0.0–5.0)
HCT: 36.1 % (ref 36.0–46.0)
Hemoglobin: 11.4 g/dL — ABNORMAL LOW (ref 12.0–15.0)
Lymphocytes Relative: 20.4 % (ref 12.0–46.0)
Lymphs Abs: 1.3 10*3/uL (ref 0.7–4.0)
MCHC: 31.5 g/dL (ref 30.0–36.0)
MCV: 63 fl — ABNORMAL LOW (ref 78.0–100.0)
Monocytes Absolute: 0.5 10*3/uL (ref 0.1–1.0)
Monocytes Relative: 7.6 % (ref 3.0–12.0)
Neutro Abs: 4.3 10*3/uL (ref 1.4–7.7)
Neutrophils Relative %: 68.1 % (ref 43.0–77.0)
Platelets: 272 10*3/uL (ref 150.0–400.0)
RBC: 5.73 Mil/uL — ABNORMAL HIGH (ref 3.87–5.11)
RDW: 14.7 % (ref 11.5–15.5)
WBC: 6.4 10*3/uL (ref 4.0–10.5)

## 2016-05-29 LAB — VITAMIN D 25 HYDROXY (VIT D DEFICIENCY, FRACTURES): VITD: 20.86 ng/mL — ABNORMAL LOW (ref 30.00–100.00)

## 2016-05-29 LAB — FERRITIN: Ferritin: 106.1 ng/mL (ref 10.0–291.0)

## 2016-05-29 LAB — VITAMIN B12: Vitamin B-12: 568 pg/mL (ref 211–911)

## 2016-05-29 LAB — IBC PANEL
Iron: 137 ug/dL (ref 42–145)
Saturation Ratios: 38.4 % (ref 20.0–50.0)
Transferrin: 255 mg/dL (ref 212.0–360.0)

## 2016-05-29 NOTE — Telephone Encounter (Signed)
I contacted [facility] and was able to provide the information needed for the for the novolin.

## 2016-05-29 NOTE — Progress Notes (Signed)
Subjective:    Patient ID: Breanna Webster, female    DOB: 1945/09/21, 71 y.o.   MRN: [id number]  HPI   71 year old female who  has a past medical history of Arthritis; Carotid artery stenosis; Colon polyps; Diabetes mellitus; Diverticulitis; Diverticulosis; GERD (gastroesophageal reflux disease); Grave's disease; Hyperlipidemia; Hypertension; IBS (irritable bowel syndrome); Osteopenia; and Thalassemia minor.  She presents to the office today with the complaint of multiple months of fatigue. Per patient "I can fall asleep sitting in a chair and I feel like I want to be sleeping all the time." Wondering if she should see a hematologist as she has a history of anemia related to thalassemia minor.    He does report that she has started exercising again.  Last A1c was 9.3 approximately 3 weeks ago.  Last check thyroid June 2017 and that was within normal limits   Review of Systems  Constitutional: Positive for activity change and fatigue. Negative for appetite change, chills, diaphoresis, fever and unexpected weight change.  Respiratory: Negative.   Cardiovascular: Negative.   Gastrointestinal: Negative.   Genitourinary: Negative.   Skin: Negative.   Neurological: Negative.   Hematological: Negative.   Psychiatric/Behavioral: Positive for sleep disturbance.   Past Medical History:  Diagnosis Date  . Arthritis   . Carotid artery stenosis    Mild  . Colon polyps   . Diabetes mellitus   . Diverticulitis   . Diverticulosis   . GERD (gastroesophageal reflux disease)   . Grave's disease   . Hyperlipidemia   . Hypertension   . IBS (irritable bowel syndrome)   . Osteopenia   . Thalassemia minor     Social History   Social History  . Marital status: Married    Spouse name: N/A  . Number of children: 2  . Years of education: N/A   Occupational History  . fundraising    Social History Main Topics  . Smoking status: Never Smoker  . Smokeless tobacco: Never Used  . Alcohol  use No  . Drug use: No  . Sexual activity: Not on file   Other Topics Concern  . Not on file   Social History Narrative   Lives with husband in a one story home.     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   Education: college.    Past Surgical History:  Procedure Laterality Date  . CHOLECYSTECTOMY    . HIATAL HERNIA REPAIR    . HYSTEROSCOPY     fibroids  . laparoscopy     fibroids    Family History  Problem Relation Age of Onset  . Adopted: Yes  . Healthy Son     x 2  . Headache      Cluster headaches  . Colon cancer Neg Hx   . Pancreatic cancer Neg Hx   . Rectal cancer Neg Hx   . Stomach cancer Neg Hx     Allergies  Allergen Reactions  . Statins Other (See Comments)    Muscle aches    Current Outpatient Prescriptions on File Prior to Visit  Medication Sig Dispense Refill  . aspirin 81 MG tablet Take 81 mg by mouth daily.      . BD INSULIN SYRINGE ULTRAFINE 31G X 15/64" 0.5 ML MISC use twice a day 200 each 2  . bisoprolol (ZEBETA) 5 MG tablet Take 1 tablet (5 mg total) by mouth daily. 30 tablet  11  . escitalopram (LEXAPRO) 20 MG tablet Take 1 tablet (20 mg total) by mouth daily. 90 tablet 1  . glucose blood (ONETOUCH VERIO) test strip 1 each by Other route daily. And lancets 1/day 100 each 12  . hydrochlorothiazide (HYDRODIURIL) 12.5 MG tablet Take 1 tablet (12.5 mg total) by mouth daily. 90 tablet 3  . insulin NPH Human (NOVOLIN N RELION) 100 UNIT/ML injection 130 units each morning and 20 units each evening. 50 mL 2  . Multiple Vitamin (MULITIVITAMIN WITH MINERALS) TABS Take 1 tablet by mouth daily.    . nortriptyline (PAMELOR) 25 MG capsule Take 1 capsule (25 mg total) by mouth at bedtime. 90 capsule 3  . SYNTHROID 150 MCG tablet Take 1 tablet (150 mcg total) by mouth daily. 90 tablet 3  . valsartan (DIOVAN) 160 MG tablet Take 1 tablet (160 mg total) by mouth daily. 90 tablet 3   No  current facility-administered medications on file prior to visit.     BP 124/62   Temp 98.1 F (36.7 C) (Oral)   Ht 5' 5"  (1.651 m)   Wt 197 lb (89.4 kg)   SpO2 94%   BMI 32.78 kg/m       Objective:   Physical Exam  Constitutional: She is oriented to person, place, and time. She appears well-developed and well-nourished. No distress.  HENT:  Head: Normocephalic and atraumatic.  Right Ear: External ear normal.  Left Ear: External ear normal.  Nose: Nose normal.  Mouth/Throat: Oropharynx is clear and moist. No oropharyngeal exudate.  Eyes: Conjunctivae and EOM are normal. Pupils are equal, round, and reactive to light. Right eye exhibits no discharge. Left eye exhibits no discharge. No scleral icterus.  Cardiovascular: Normal rate, regular rhythm, normal heart sounds and intact distal pulses.  Exam reveals no gallop and no friction rub.   No murmur heard. Pulmonary/Chest: Effort normal and breath sounds normal. No respiratory distress. She has no wheezes. She has no rales. She exhibits no tenderness.  Neurological: She is alert and oriented to person, place, and time.  Skin: Skin is warm and dry. No rash noted. She is not diaphoretic. No erythema. No pallor.  Psychiatric: She has a normal mood and affect. Her behavior is normal. Judgment and thought content normal.  Nursing note and vitals reviewed.     Assessment & Plan:  1. Fatigue, unspecified type - Unknown cause of fatigue at this point in time. Could be her elevated A1c. I advised her to continue to work on exercise and eating healthy, take diabetic medications as directed  Vitamin D, 25-hydroxy - Vitamin B12 - CBC with Differential/Platelet - Ferritin - IBC Panel  2. Vitamin D deficiency - Vitamin D, 25-hydroxy  3. Thalassemia minor - Should not cause her symptoms. We'll check for other causes of anemia - Vitamin B12 - CBC with Differential/Platelet - Ferritin - IBC Panel - Consider referral to hematology    [name], NP

## 2016-05-29 NOTE — Telephone Encounter (Signed)
[facility] stated they need a response within the hour or they will make the decision on there own concerning the Novolin N.   Direct line # [phone] Case # [id number]

## 2016-05-30 ENCOUNTER — Encounter: Payer: Self-pay | Admitting: Gastroenterology

## 2016-05-31 ENCOUNTER — Other Ambulatory Visit: Payer: Self-pay | Admitting: Endocrinology

## 2016-05-31 NOTE — Telephone Encounter (Signed)
Patient stated in the morning her b/s is 126 and at night it is 355 She has gained 6 pounds and has a rash across her stomach. Please advise

## 2016-06-01 NOTE — Telephone Encounter (Signed)
We can change insulins, but this would be much more expensive.  If I was you, I would keep moving the insulin injections around.

## 2016-06-01 NOTE — Telephone Encounter (Signed)
I contacted the patient and advised of message. Patient is ok with staying on the Novolin N for right now.

## 2016-06-01 NOTE — Telephone Encounter (Signed)
Please change the insulin to 140 units in the AM and 10 units in the PM. Try injecting into the thigh, to see if the rash moves there.

## 2016-06-01 NOTE — Telephone Encounter (Signed)
See message and please advise, Thanks!  

## 2016-06-01 NOTE — Telephone Encounter (Signed)
I contacted the patient and advised of message. Patient voiced understanding on the new dosage. She stated she injected on her leg and after injecting a red bump would appear. Patient was not sure how to proceed after this finding.  Please advise, Thanks!

## 2016-06-07 ENCOUNTER — Other Ambulatory Visit: Payer: Self-pay | Admitting: Adult Health

## 2016-06-07 DIAGNOSIS — M549 Dorsalgia, unspecified: Secondary | ICD-10-CM

## 2016-06-13 NOTE — Telephone Encounter (Signed)
I contacted the patient and she stated when she had called previously she had left blood sugar readings for Dr. Loanne Drilling to review. I contacted the patient and advised I spoke with Mardene Celeste and she did not have record of the blood sugars being relayed to her. Patient is going to call back and report blood sugars at a later time because she could not remember the readings and she was not at home when I called.

## 2016-06-13 NOTE — Telephone Encounter (Signed)
Patient is calling on the status of insulin Novolin.  Please advise

## 2016-06-14 MED ORDER — GLUCOSE BLOOD VI STRP: 1.0000 | ORAL_STRIP | Freq: Every day | 12 refills | Status: DC

## 2016-06-14 NOTE — Telephone Encounter (Signed)
OK, please increase to 150 units in the am and 10 units in the pm

## 2016-06-14 NOTE — Telephone Encounter (Signed)
Please verify the insulin is 140 units in the AM and 10 units in the PM. Then, I need to know if the blood sugar is higher fasting, or later in the day?

## 2016-06-14 NOTE — Telephone Encounter (Signed)
Patient notified of message and voiced understanding.

## 2016-06-14 NOTE — Telephone Encounter (Signed)
See message and please advise, Thanks!  

## 2016-06-14 NOTE — Telephone Encounter (Signed)
Pt is calling back the average for 7 days was 193 BS level is she to increase her dosing or no?

## 2016-06-14 NOTE — Telephone Encounter (Signed)
I contacted the patient. Patient confirmed she is taking 140 units in the am and 10 units in the pm of the Novolin N. Patient stated her highest blood sugar readings are later in the day.

## 2016-06-18 ENCOUNTER — Other Ambulatory Visit: Payer: Self-pay

## 2016-06-18 DIAGNOSIS — M545 Low back pain: Secondary | ICD-10-CM | POA: Diagnosis not present

## 2016-06-18 DIAGNOSIS — G8929 Other chronic pain: Secondary | ICD-10-CM | POA: Diagnosis not present

## 2016-06-18 MED ORDER — ONETOUCH DELICA LANCETS 33G MISC: 2 refills | Status: DC

## 2016-06-18 MED ORDER — GLUCOSE BLOOD VI STRP: ORAL_STRIP | 2 refills | Status: DC

## 2016-06-22 MED ORDER — ONETOUCH DELICA LANCETS 33G MISC: 1 refills | Status: DC

## 2016-06-22 MED ORDER — "INSULIN SYRINGE 30G X 1/2"" 0.5 ML MISC": 2 refills | Status: DC

## 2016-06-22 MED ORDER — GLUCOSE BLOOD VI STRP: ORAL_STRIP | 2 refills | Status: DC

## 2016-06-22 NOTE — Telephone Encounter (Signed)
Lancets and test strips need to go to [facility] please  The syringe needs to be extra small that go only up to 50 u for her night dosing.-this is called into [facility]

## 2016-06-22 NOTE — Telephone Encounter (Signed)
Prescriptions submitted.

## 2016-06-26 ENCOUNTER — Encounter: Payer: Self-pay | Admitting: Interventional Cardiology

## 2016-06-27 NOTE — Telephone Encounter (Signed)
The [facility]O form was not sent to [facility] for the lancets and test strips. Please help her with this, she has about 3 days left of supplies.

## 2016-06-27 NOTE — Telephone Encounter (Signed)
I contacted the patient and advised we faxed the CMN form this morning and I have re-faxed again. Patient advised to check with pharmacy.

## 2016-07-02 DIAGNOSIS — G8929 Other chronic pain: Secondary | ICD-10-CM | POA: Diagnosis not present

## 2016-07-02 DIAGNOSIS — M545 Low back pain: Secondary | ICD-10-CM | POA: Diagnosis not present

## 2016-07-04 ENCOUNTER — Telehealth (HOSPITAL_COMMUNITY): Payer: Self-pay | Admitting: *Deleted

## 2016-07-04 ENCOUNTER — Encounter: Payer: Self-pay | Admitting: Interventional Cardiology

## 2016-07-04 ENCOUNTER — Ambulatory Visit (INDEPENDENT_AMBULATORY_CARE_PROVIDER_SITE_OTHER): Payer: Medicare Other | Admitting: Interventional Cardiology

## 2016-07-04 VITALS — BP 114/66 | HR 70 | Ht 65.0 in | Wt 196.0 lb

## 2016-07-04 DIAGNOSIS — I10 Essential (primary) hypertension: Secondary | ICD-10-CM | POA: Diagnosis not present

## 2016-07-04 DIAGNOSIS — R0609 Other forms of dyspnea: Secondary | ICD-10-CM

## 2016-07-04 DIAGNOSIS — I447 Left bundle-branch block, unspecified: Secondary | ICD-10-CM | POA: Diagnosis not present

## 2016-07-04 DIAGNOSIS — R0600 Dyspnea, unspecified: Secondary | ICD-10-CM

## 2016-07-04 DIAGNOSIS — I5032 Chronic diastolic (congestive) heart failure: Secondary | ICD-10-CM

## 2016-07-04 DIAGNOSIS — I350 Nonrheumatic aortic (valve) stenosis: Secondary | ICD-10-CM | POA: Diagnosis not present

## 2016-07-04 DIAGNOSIS — R0789 Other chest pain: Secondary | ICD-10-CM | POA: Diagnosis not present

## 2016-07-04 NOTE — Patient Instructions (Signed)
Medication Instructions:  None  Labwork: None  Testing/Procedures: Your physician has requested that you have a lexiscan myoview. For further information please visit [email or web address]. Please follow instruction sheet, as given.   Follow-Up: Your physician recommends that you schedule a follow-up appointment will be based on test outcome.    Any Other Special Instructions Will Be Listed Below (If Applicable).     If you need a refill on your cardiac medications before your next appointment, please call your pharmacy.

## 2016-07-04 NOTE — Telephone Encounter (Signed)
Patient given detailed instructions per Myocardial Perfusion Study Information Sheet for the test on 07/09/16 at 0745. Patient notified to arrive 15 minutes early and that it is imperative to arrive on time for appointment to keep from having the test rescheduled.  If you need to cancel or reschedule your appointment, please call the office within 24 hours of your appointment. Failure to do so may result in a cancellation of your appointment, and a $50 no show fee. Patient verbalized understanding.Cyanna Neace, Ranae [place]ms

## 2016-07-04 NOTE — Progress Notes (Signed)
Cardiology Office Note    Date:  07/04/2016   ID:  BROOKS STOTZ, DOB [birth year], MRN [id number]  PCP:  [name], NP  Cardiologist: Sinclair Grooms, MD   Chief Complaint  Patient presents with  . Shortness of Breath    History of Present Illness:  Breanna Webster is a 71 y.o. female with history of dyspnea on exertion, aortic stenosis, left bundle branch block, diabetes, hyperlipidemia, and gastroesophageal reflux.  Returns today with a multitude of complaints including excessive fatigue, dyspnea on exertion. She has had dyspnea on exertion related to walking up inclines for greater than 30 years. Now she has dyspnea with walking short distances on a flat plane. There is also tightness associated with dyspnea. She denies orthopnea, PND, and ankle swelling. She has not had palpitations or syncope. No palpitations.  Though she is adopted, her genetic mother was alcoholic and died of heart failure but she was a 57+ she is of age. A 20 year old brother also died of heart disease but she does not know details.  Past Medical History:  Diagnosis Date  . Arthritis   . Carotid artery stenosis    Mild  . Colon polyps   . Diabetes mellitus   . Diverticulitis   . Diverticulosis   . GERD (gastroesophageal reflux disease)   . Grave's disease   . Hyperlipidemia   . Hypertension   . IBS (irritable bowel syndrome)   . Osteopenia   . Thalassemia minor     Past Surgical History:  Procedure Laterality Date  . CHOLECYSTECTOMY    . HIATAL HERNIA REPAIR    . HYSTEROSCOPY     fibroids  . laparoscopy     fibroids    Current Medications: Outpatient Medications Prior to Visit  Medication Sig Dispense Refill  . aspirin 81 MG tablet Take 81 mg by mouth daily.      . bisoprolol (ZEBETA) 5 MG tablet Take 1 tablet (5 mg total) by mouth daily. 30 tablet 11  . escitalopram (LEXAPRO) 20 MG tablet Take 1 tablet (20 mg total) by mouth daily. 90 tablet 1  . glucose blood (ONETOUCH  VERIO) test strip Use to check blood sugar 2 times per day. Dx Code: E11.8 200 each 2  . hydrochlorothiazide (HYDRODIURIL) 12.5 MG tablet Take 1 tablet (12.5 mg total) by mouth daily. 90 tablet 3  . Insulin Syringe-Needle U-100 (INSULIN SYRINGE .5CC/30GX1/2") 30G X 1/2" 0.5 ML MISC Use to inject insulin 2 times per day. 100 each 2  . Multiple Vitamin (MULITIVITAMIN WITH MINERALS) TABS Take 1 tablet by mouth daily.    . nortriptyline (PAMELOR) 25 MG capsule Take 1 capsule (25 mg total) by mouth at bedtime. 90 capsule 3  . ONETOUCH DELICA LANCETS 35T MISC Use to check blood sugar 2 times per day. Dx Code: E11.8 200 each 1  . SYNTHROID 150 MCG tablet take 1 tablet by mouth once daily 90 tablet 3  . valsartan (DIOVAN) 160 MG tablet Take 1 tablet (160 mg total) by mouth daily. 90 tablet 3  . insulin NPH Human (NOVOLIN N RELION) 100 UNIT/ML injection 130 units each morning and 20 units each evening. (Patient not taking: Reported on 07/04/2016) 50 mL 2   No facility-administered medications prior to visit.      Allergies:   Statins   Social History   Social History  . Marital status: Married    Spouse name: N/A  . Number of children: 2  . Years of  education: N/A   Occupational History  . fundraising    Social History Main Topics  . Smoking status: Never Smoker  . Smokeless tobacco: Never Used  . Alcohol use No  . Drug use: No  . Sexual activity: Not Asked   Other Topics Concern  . None   Social History Narrative   Lives with husband in a one story home.     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   Education: college.     Family History:  The patient's family history includes Healthy in her son. She was adopted.   ROS:   Please see the history of present illness.    Doesn't sleep well. Denies snoring. Husband confirms that she does not snore. Can sleep all night but awakens feeling tired and is disease he to  fall back to sleep. She is worried she may have coronary disease.  All other systems reviewed and are negative.   PHYSICAL EXAM:   VS:  BP 114/66 (BP Location: Left Arm)   Pulse 70   Ht 5' 5"  (1.651 m)   Wt 196 lb (88.9 kg)   BMI 32.62 kg/m    GEN: Well nourished, well developed, in no acute distress  HEENT: normal  Neck: no JVD, carotid bruits, or masses Cardiac: RRR; 3/6 crescendo decrescendo systolic murmur. S4 gallop is audible. gallops; there is no peripheral edema . Respiratory:  clear to auscultation bilaterally, normal work of breathing GI: soft, nontender, nondistended, + BS MS: no deformity or atrophy  Skin: warm and dry, no rash Neuro:  Alert and Oriented x 3, Strength and sensation are intact Psych: euthymic mood, full affect  Wt Readings from Last 3 Encounters:  07/04/16 196 lb (88.9 kg)  05/29/16 197 lb (89.4 kg)  05/23/16 195 lb 4 oz (88.6 kg)      Studies/Labs Reviewed:   EKG:  EKG  ECG 12/06/2015 revealed left bundle branch block, first-degree AV block, left atrial abnormality.  Recent Labs: 10/07/2015: TSH 1.89 12/06/2015: Brain Natriuretic Peptide 77.1 04/13/2016: ALT 83; BUN 12; Creatinine, Ser 0.78; Potassium 4.1; Sodium 138 05/29/2016: Hemoglobin 11.4; Platelets 272.0   Lipid Panel    Component Value Date/Time   CHOL 159 04/13/2016 0804   TRIG 104.0 04/13/2016 0804   HDL 42.90 04/13/2016 0804   CHOLHDL 4 04/13/2016 0804   VLDL 20.8 04/13/2016 0804   LDLCALC 96 04/13/2016 0804   LDLDIRECT 98.0 05/24/2014 1343    Additional studies/ records that were reviewed today include:    Echocardiogram performed 12/14/15: ------------------------------------------------------------------- Indications:      (R06.00).  ------------------------------------------------------------------- History:   PMH:  Acquired from the patient and from the patient&'s chart.  Dyspnea.  Risk factors:  Hypertension. Obese. Dyslipidemia.     ------------------------------------------------------------------- Study Conclusions  - Left ventricle: The cavity size was normal. Systolic function was   normal. The estimated ejection fraction was in the range of 60%   to 65%. Wall motion was normal; there were no regional wall   motion abnormalities. Doppler parameters are consistent with   abnormal left ventricular relaxation (grade 1 diastolic   dysfunction). - Aortic valve: Trileaflet; moderately calcified leaflets. There   was mild stenosis. There was mild regurgitation. Mean gradient   (S): 15 mm Hg. Peak gradient (S): 29 mm Hg. - Mitral valve: Moderately calcified annulus. Mildly calcified   leaflets . There was no significant regurgitation. - Left atrium: The atrium was mildly  to moderately dilated. - Right ventricle: The cavity size was normal. Systolic function   was normal. - Pulmonary arteries: No complete TR doppler jet so unable to   estimate PA pressure. - Inferior vena cava: The vessel was normal in size. The   respirophasic diameter changes were in the normal range (>= 50%),   consistent with normal central venous pressure.  Impressions:  - Normal LV size with EF 60-65%. Normal RV size and systolic   function. Mild aortic stenosis and mild aortic regurgitation.   ASSESSMENT:    1. Dyspnea on exertion   2. Aortic stenosis, mild   3. Chronic diastolic congestive heart failure (Rachel)   4. Essential hypertension, benign   5. Left bundle branch block      PLAN:  In order of problems listed above:  1. Uncertain etiology. The complaint that there is some mild chest discomfort associated with it raises a question of coronary artery disease. A Lexiscan myocardial perfusion study will be performed. This will help to exclude myocardial ischemia. Also we will not have a false positive study. She hasn't gone normal LV function. If ischemia is ruled out. I would suggest performing a sleep study. 2. Clinical  evidence of aortic stenosis based upon auscultation. A less than 23-monthold echo demonstrated only mild increase in velocity through the aortic valve. 3. No evidence of volume overload. No clinical history of volume overload/CHF. 4. Very well controlled given her history of hypertension. She is on hydrochlorothiazide 25 mg per day and irbesartan. 5. Not reevaluated.   We will evaluate for the presence of absence of coronary disease. She will return to see me if the study is abnormal. If not abnormal I would recommend consideration of a sleep study to rule out obstructive sleep apnea/sleep disturbance.    Medication Adjustments/Labs and Tests Ordered: Current medicines are reviewed at length with the patient today.  Concerns regarding medicines are outlined above.  Medication changes, Labs and Tests ordered today are listed in the Patient Instructions below. There are no Patient Instructions on file for this visit.   Signed, HSinclair Grooms MD  07/04/2016 2:47 PM    [name]  [id number]: [phone] Fax: [phone]

## 2016-07-05 ENCOUNTER — Ambulatory Visit (INDEPENDENT_AMBULATORY_CARE_PROVIDER_SITE_OTHER): Payer: Medicare Other | Admitting: Endocrinology

## 2016-07-05 ENCOUNTER — Encounter: Payer: Self-pay | Admitting: Endocrinology

## 2016-07-05 VITALS — BP 122/64 | HR 84 | Ht 65.0 in | Wt 197.0 lb

## 2016-07-05 DIAGNOSIS — E119 Type 2 diabetes mellitus without complications: Secondary | ICD-10-CM | POA: Diagnosis not present

## 2016-07-05 LAB — POCT GLYCOSYLATED HEMOGLOBIN (HGB A1C): Hemoglobin A1C: 7.5

## 2016-07-05 MED ORDER — INSULIN NPH (HUMAN) (ISOPHANE) 100 UNIT/ML ~~LOC~~ SUSP: 160.0000 [IU] | SUBCUTANEOUS | 11 refills | Status: DC

## 2016-07-05 NOTE — Patient Instructions (Addendum)
Please change the NPH insulin to 160 units daily--all in the morning.  check your blood sugar twice a day.  vary the time of day when you check, between before the 3 meals, and at bedtime.  also check if you have symptoms of your blood sugar being too high or too low.  please keep a record of the readings and bring it to your next appointment here (or you can bring the meter itself).  You can write it on any piece of paper.  please call us sooner if your blood sugar goes below 70, or if you have a lot of readings over 200.   Please come back for a follow-up appointment in 2-3 months.

## 2016-07-05 NOTE — Progress Notes (Signed)
Subjective:    Patient ID: Breanna Webster, female    DOB: 11-Aug-1945, 71 y.o.   MRN: [id number]  HPI Pt returns for f/u of diabetes mellitus: DM type: Insulin-requiring type 2 Dx'ed: 2003.  Complications: none.   Therapy: insulin since 2017.     GDM: never.   DKA: never.  Severe hypoglycemia: never.  Pancreatitis: never.  Other: she stopped metformin-XR due to nausea and diarrhea; pioglitizone is precluded by edema.  she takes human insulin, due to cost.  Interval history: no cbg record, but states cbg's are in the low-100's.  It is in general higher as the day goes on.   Past Medical History:  Diagnosis Date  . Arthritis   . Carotid artery stenosis    Mild  . Colon polyps   . Diabetes mellitus   . Diverticulitis   . Diverticulosis   . GERD (gastroesophageal reflux disease)   . Grave's disease   . Hyperlipidemia   . Hypertension   . IBS (irritable bowel syndrome)   . Osteopenia   . Thalassemia minor     Past Surgical History:  Procedure Laterality Date  . CHOLECYSTECTOMY    . HIATAL HERNIA REPAIR    . HYSTEROSCOPY     fibroids  . laparoscopy     fibroids    Social History   Social History  . Marital status: Married    Spouse name: N/A  . Number of children: 2  . Years of education: N/A   Occupational History  . fundraising    Social History Main Topics  . Smoking status: Never Smoker  . Smokeless tobacco: Never Used  . Alcohol use No  . Drug use: No  . Sexual activity: Not on file   Other Topics Concern  . Not on file   Social History Narrative   Lives with husband in a one story home.     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   Education: college.    Current Outpatient Prescriptions on File Prior to Visit  Medication Sig Dispense Refill  . aspirin 81 MG tablet Take 81 mg by mouth daily.      . bisoprolol (ZEBETA) 5 MG tablet Take 1 tablet (5 mg total) by mouth daily.  30 tablet 11  . escitalopram (LEXAPRO) 20 MG tablet Take 1 tablet (20 mg total) by mouth daily. 90 tablet 1  . glucose blood (ONETOUCH VERIO) test strip Use to check blood sugar 2 times per day. Dx Code: E11.8 200 each 2  . hydrochlorothiazide (HYDRODIURIL) 12.5 MG tablet Take 1 tablet (12.5 mg total) by mouth daily. 90 tablet 3  . Insulin Syringe-Needle U-100 (INSULIN SYRINGE .5CC/30GX1/2") 30G X 1/2" 0.5 ML MISC Use to inject insulin 2 times per day. 100 each 2  . Multiple Vitamin (MULITIVITAMIN WITH MINERALS) TABS Take 1 tablet by mouth daily.    . nortriptyline (PAMELOR) 25 MG capsule Take 1 capsule (25 mg total) by mouth at bedtime. 90 capsule 3  . ONETOUCH DELICA LANCETS 69G MISC Use to check blood sugar 2 times per day. Dx Code: E11.8 200 each 1  . SYNTHROID 150 MCG tablet take 1 tablet by mouth once daily 90 tablet 3  . valsartan (DIOVAN) 160 MG tablet Take 1 tablet (160 mg total) by mouth daily. 90 tablet 3   No current facility-administered medications on file prior to visit.     Allergies  Allergen Reactions  .  Statins Other (See Comments)    Muscle aches    Family History  Problem Relation Age of Onset  . Adopted: Yes  . Healthy Son     x 2  . Headache      Cluster headaches  . Colon cancer Neg Hx   . Pancreatic cancer Neg Hx   . Rectal cancer Neg Hx   . Stomach cancer Neg Hx     BP 122/64   Pulse 84   Ht 5' 5"  (1.651 m)   Wt 197 lb (89.4 kg)   SpO2 94%   BMI 32.78 kg/m   Review of Systems She denies hypoglycemia.      Objective:   Physical Exam VITAL SIGNS:  See vs page GENERAL: no distress Pulses: dorsalis pedis intact bilat.   MSK: no deformity of the feet CV: no leg edema Skin:  no ulcer on the feet.  normal color and temp on the feet. Neuro: sensation is intact to touch on the feet   a1c=7.5%    Assessment & Plan:  Insulin-requiring type 2 DM: this is the best control this pt should aim for, given this regimen, which does match insulin to her  changing needs throughout the day.  Based on the pattern of her cbg's, she needs to take all of the insulin in the morning.    Patient is advised the following: Patient Instructions  Please change the NPH insulin to 160 units daily--all in the morning.  check your blood sugar twice a day.  vary the time of day when you check, between before the 3 meals, and at bedtime.  also check if you have symptoms of your blood sugar being too high or too low.  please keep a record of the readings and bring it to your next appointment here (or you can bring the meter itself).  You can write it on any piece of paper.  please call us sooner if your blood sugar goes below 70, or if you have a lot of readings over 200.   Please come back for a follow-up appointment in 2-3 months.

## 2016-07-09 ENCOUNTER — Ambulatory Visit (HOSPITAL_COMMUNITY): Payer: Medicare Other | Attending: Cardiovascular Disease

## 2016-07-09 DIAGNOSIS — Z8249 Family history of ischemic heart disease and other diseases of the circulatory system: Secondary | ICD-10-CM | POA: Diagnosis not present

## 2016-07-09 DIAGNOSIS — R0789 Other chest pain: Secondary | ICD-10-CM | POA: Insufficient documentation

## 2016-07-09 DIAGNOSIS — R0609 Other forms of dyspnea: Secondary | ICD-10-CM

## 2016-07-09 DIAGNOSIS — R0600 Dyspnea, unspecified: Secondary | ICD-10-CM

## 2016-07-09 MED ORDER — REGADENOSON 0.4 MG/5ML IV SOLN
0.4000 mg | Freq: Once | INTRAVENOUS | Status: AC
  Administered 2016-07-09: 0.4 mg via INTRAVENOUS

## 2016-07-09 MED ORDER — TECHNETIUM TC 99M TETROFOSMIN IV KIT
33.0000 | PACK | Freq: Once | INTRAVENOUS | Status: AC | PRN
  Administered 2016-07-09: 33 via INTRAVENOUS
  Filled 2016-07-09: qty 33

## 2016-07-11 ENCOUNTER — Ambulatory Visit (HOSPITAL_COMMUNITY): Payer: Medicare Other | Attending: Internal Medicine

## 2016-07-11 LAB — MYOCARDIAL PERFUSION IMAGING
LV dias vol: 84 mL (ref 46–106)
LV sys vol: 38 mL
Peak HR: 85 {beats}/min
RATE: 0.26
Rest HR: 75 {beats}/min
SDS: 4
SRS: 0
SSS: 4
TID: 0.93

## 2016-07-11 MED ORDER — TECHNETIUM TC 99M TETROFOSMIN IV KIT
32.5000 | PACK | Freq: Once | INTRAVENOUS | Status: AC | PRN
  Administered 2016-07-11: 32.5 via INTRAVENOUS
  Filled 2016-07-11: qty 33

## 2016-07-13 ENCOUNTER — Telehealth: Payer: Self-pay | Admitting: Interventional Cardiology

## 2016-07-13 NOTE — Telephone Encounter (Signed)
Attempted to call pt back.  Phone rang several times with no answer and no VM.  Will try again later.

## 2016-07-13 NOTE — Telephone Encounter (Signed)
New message    Pt states she is calling back for her stress test results and requests a call back as soon as possible

## 2016-07-16 DIAGNOSIS — M545 Low back pain: Secondary | ICD-10-CM | POA: Diagnosis not present

## 2016-07-16 DIAGNOSIS — G8929 Other chronic pain: Secondary | ICD-10-CM | POA: Diagnosis not present

## 2016-07-16 NOTE — Telephone Encounter (Signed)
Follow up    Pt calling back about stress test results , leave a message as to what is wrong , or if everything is ok

## 2016-07-16 NOTE — Telephone Encounter (Signed)
Left detailed message on pt's cell phone as requested by pt.  See phone note.  Advised to call our office with any questions.

## 2016-07-19 ENCOUNTER — Other Ambulatory Visit (INDEPENDENT_AMBULATORY_CARE_PROVIDER_SITE_OTHER): Payer: Medicare Other

## 2016-07-19 ENCOUNTER — Ambulatory Visit (INDEPENDENT_AMBULATORY_CARE_PROVIDER_SITE_OTHER): Payer: Medicare Other | Admitting: Internal Medicine

## 2016-07-19 ENCOUNTER — Encounter: Payer: Self-pay | Admitting: Internal Medicine

## 2016-07-19 ENCOUNTER — Ambulatory Visit (INDEPENDENT_AMBULATORY_CARE_PROVIDER_SITE_OTHER)
Admission: RE | Admit: 2016-07-19 | Discharge: 2016-07-19 | Disposition: A | Discharge status: Home / Self Care | Payer: Medicare Other | Source: Ambulatory Visit | Attending: Internal Medicine | Admitting: Internal Medicine

## 2016-07-19 VITALS — BP 132/76 | HR 66

## 2016-07-19 DIAGNOSIS — R0600 Dyspnea, unspecified: Secondary | ICD-10-CM

## 2016-07-19 DIAGNOSIS — R0609 Other forms of dyspnea: Secondary | ICD-10-CM

## 2016-07-19 DIAGNOSIS — M25571 Pain in right ankle and joints of right foot: Secondary | ICD-10-CM | POA: Diagnosis not present

## 2016-07-19 LAB — CBC WITH DIFFERENTIAL/PLATELET
Basophils Absolute: 0.1 10*3/uL (ref 0.0–0.1)
Basophils Relative: 0.8 % (ref 0.0–3.0)
Eosinophils Absolute: 0.2 10*3/uL (ref 0.0–0.7)
Eosinophils Relative: 2.5 % (ref 0.0–5.0)
HCT: 36.5 % (ref 36.0–46.0)
Hemoglobin: 11.5 g/dL — ABNORMAL LOW (ref 12.0–15.0)
Lymphocytes Relative: 23 % (ref 12.0–46.0)
Lymphs Abs: 1.9 10*3/uL (ref 0.7–4.0)
MCHC: 31.6 g/dL (ref 30.0–36.0)
MCV: 63.1 fl — ABNORMAL LOW (ref 78.0–100.0)
Monocytes Absolute: 0.7 10*3/uL (ref 0.1–1.0)
Monocytes Relative: 8 % (ref 3.0–12.0)
Neutro Abs: 5.4 10*3/uL (ref 1.4–7.7)
Neutrophils Relative %: 65.7 % (ref 43.0–77.0)
Platelets: 280 10*3/uL (ref 150.0–400.0)
RBC: 5.77 Mil/uL — ABNORMAL HIGH (ref 3.87–5.11)
RDW: 15.6 % — ABNORMAL HIGH (ref 11.5–15.5)
WBC: 8.2 10*3/uL (ref 4.0–10.5)

## 2016-07-19 LAB — BRAIN NATRIURETIC PEPTIDE: Pro B Natriuretic peptide (BNP): 133 pg/mL — ABNORMAL HIGH (ref 0.0–100.0)

## 2016-07-19 LAB — NITRIC OXIDE: Nitric Oxide: 10

## 2016-07-19 LAB — TSH: TSH: 1.12 u[IU]/mL (ref 0.35–4.50)

## 2016-07-19 LAB — BASIC METABOLIC PANEL
BUN: 15 mg/dL (ref 6–23)
CO2: 29 mEq/L (ref 19–32)
Calcium: 9.8 mg/dL (ref 8.4–10.5)
Chloride: 103 mEq/L (ref 96–112)
Creatinine, Ser: 0.76 mg/dL (ref 0.40–1.20)
GFR: 79.36 mL/min (ref >60.00)
Glucose, Bld: 148 mg/dL — ABNORMAL HIGH (ref 70–99)
Potassium: 4 mEq/L (ref 3.5–5.1)
Sodium: 139 mEq/L (ref 135–145)

## 2016-07-19 MED ORDER — ESOMEPRAZOLE MAGNESIUM 40 MG PO CPDR: 40.0000 mg | DELAYED_RELEASE_CAPSULE | Freq: Two times a day (BID) | ORAL | 2 refills | Status: DC

## 2016-07-19 NOTE — Progress Notes (Signed)
Subjective:     Patient ID: Breanna Webster, female   DOB: [birth year]     MRN: [id number]    Brief patient profile:  40 yowf never smoker with pnds as child allergy tested as child doesn't know details, never took shots,  outgrew completely then symptoms resumed in 90's after moved to [place] from [facility] with coughing that improved p NF late 90s but still had drainage variably  severe no pattern as to time of year then much worse since summer 2016 with onset of cough/wheeze May 2017 referred to pulmonary clinic 10/14/2015 by Dr Betty [name].   History of Present Illness  10/14/2015 1st [facility] office visit/ Delorise Hunkele   Chief Complaint  Patient presents with  . Pulmonary Consult    Referred by Dr. Betty [name]. Pt c/o cough and wheezing x 5 wks. Cough is occ prod with minimal green sputum.   acute onset cough > green mucus one week p husband's uri May 2017 >  rx with abx/ prednisone improved then worse 2 days p finished but mostly productive  white mucus.  If doesn't take the codeine cough med then coughs worse at hs.  No assoc sob/ some wheeze better with saba  rec The key to effective treatment for your cough is eliminating the non-stop cycle of cough you're stuck in long enough to let your airway heal completely and then see if there is anything still making you cough once you stop the cough suppression, but this should take no more than 5 days to figure out Eliminate all coughing for 3 days with your codiene syrup and then switch delsym 2 tsp every 12 hours  Prednisone 10 mg take  4 each am x 2 days,   2 each am x 2 days,  1 each am x 2 days and stop (this is to eliminate allergies and inflammation from coughing) Protonix (pantoprazole) Take 30-60 min before first meal of the day and Pepcid 20 mg one bedtime plus chlorpheniramine 4 mg x 2 at bedtime (both available over the counter)  until cough is completely gone for at least a week without the need for cough suppression GERD  diet    11/02/2015 acute extended ov/Jonee Lamore re: cough since may 2017   Chief Complaint  Patient presents with  . Pulmonary Consult    Cough has only improved some. She is still wheezing and her cough is prod with green sputum.    worse in am and at bedtime and slt green mucus again p initially turning white  rec Please see patient coordinator before you leave today  to schedule sinus CT and we will call results  For drainage / throat tickle try take CHLORPHENIRAMINE  4 mg - take one every 4 hours as needed -  Please remember to go to the lab  department downstairs for your tests - we will call you with the results when they are available. Until cough gone 100% with no need for any cough medicine > prilosec or pantoprazole 40 mg proton pump inhibitor and continue pepcid 20 mg at hs Take delsym two tsp every 12 hours and supplement if needed with tylenol #3  up to 2 every 4 hours to suppress the urge to cough. Swallowing water or using ice chips/non mint and menthol containing candies (such as lifesavers or sugarless jolly ranchers) are also effective.  You should rest your voice and avoid activities that you know make you cough. Once you have eliminated the cough for 3  straight days try reducing the tylenol #3  first,  then the delsym as tolerated.   Stop toprol (lopressor/metaprolol)  And start bisoprolol 5 mg one daily    12/13/2015  f/u ov/Hatice Bubel re: cyclical cough/ gerd Chief Complaint  Patient presents with  . Follow-up    Cough has resolved completely and no more wheezing for the past month. No new co's.    no cough at all/ on no gerd meds/ on bisoprolol instead of metaprolol and doing great  rec Remember at the onset of any respiratory complaint :  Try prilosec otc 87m  Take 30-60 min before first meal of the day and Pepcid ac (famotidine) 20 mg one @  bedtime until completely better for a week For drainage / throat tickle try take CHLORPHENIRAMINE  4 mg - take one every 4 hours as  needed - available over the counter- may cause drowsiness so start with just a bedtime dose or two and see how you tolerate it before trying in daytime       07/19/2016 acute extended ov/Kordell Jafri re: sob no longer on gerd rx "I don't have any HB"  Chief Complaint  Patient presents with  . Acute Visit    Increased SOB x 3 months. She states that she gets SOB with any exertion at all.    new problem =  Sob with exertion lifelong always the last one off the playground and trouble with steps and hills as long as she can recall Baseline = walking up to 45 min flat [facility] s stopping  and 30 min on bicycle at "moderate pace and restance"  Took some time off due to back pain and now can't walk 5 min with back pain and sob with back pain so severe today in w/c and pain shooting down R leg to foot s/p failed injections so far  Never on inhalers   No obvious day to day or daytime variability or assoc excess/ purulent sputum or mucus plugs or hemoptysis or cp or chest tightness, subjective wheeze or overt sinus or hb symptoms. No unusual exp hx or h/o childhood pna/ asthma or knowledge of premature birth.  Sleeping ok resp wise  without nocturnal  or early am exacerbation  of respiratory  c/o's or need for noct saba. Also denies any obvious fluctuation of symptoms with weather or environmental changes or other aggravating or alleviating factors except as outlined above   Current Medications, Allergies, Complete Past Medical History, Past Surgical History, Family History, and Social History were reviewed in [employer].  ROS  The following are not active complaints unless bolded sore throat, dysphagia, dental problems, itching, sneezing,  nasal congestion or excess/ purulent secretions, ear ache,   fever, chills, sweats, unintended wt loss, classically pleuritic or exertional cp,  orthopnea pnd or leg swelling, presyncope, palpitations, abdominal pain,  anorexia, nausea, vomiting, diarrhea  or change in bowel or bladder habits, change in stools or urine, dysuria,hematuria,  rash, arthralgias, visual complaints, headache, numbness, weakness or ataxia or problems with walking or coordination,  change in mood/affect or memory.                   Objective:   Physical Exam    w/c  anxious wf  nad   07/19/2016        196 per pt  12/13/2015       193   11/02/2015       193  10/14/15 192 lb (87.091 kg)  10/10/15 190 lb (86.183 kg)  10/07/15 189 lb 4 oz (85.843 kg)    Vital signs reviewed - Note on arrival 02 sats  97% on RA      HEENT: nl dentition, turbinates, and oropharynx. Nl external ear canals without cough reflex   NECK :  without JVD/Nodes/TM/ nl carotid upstrokes bilaterally   LUNGS: no acc muscle use,   Clear to  A and P with no cough on insp  CV:  RRR   II-III/VI  murmur or increase in P2, no edema   ABD:  Obese/ soft and nontender with nl inspiratory excursion in the supine position. No bruits or organomegaly, bowel sounds nl  MS:  Nl gait/ ext warm without deformities, calf tenderness, cyanosis or clubbing No obvious joint restrictions   SKIN: warm and dry without lesions    NEURO:  alert, approp, nl sensorium with  no motor deficits       CXR PA and Lateral:   07/19/2016 :    I personally reviewed images and agree with radiology impression as follows: No active cardiopulmonary disease.   Labs ordered/ reviewed:     Chemistry      Component Value Date/Time   NA 139 07/19/2016 1453   K 4.0 07/19/2016 1453   CL 103 07/19/2016 1453   CO2 29 07/19/2016 1453   BUN 15 07/19/2016 1453   CREATININE 0.76 07/19/2016 1453      Component Value Date/Time   CALCIUM 9.8 07/19/2016 1453   ALKPHOS 68 04/13/2016 0804   AST 67 (H) 04/13/2016 0804   ALT 83 (H) 04/13/2016 0804   BILITOT 0.9 04/13/2016 0804        Lab Results  Component Value Date   WBC 8.2 07/19/2016   HGB 11.5 (L) 07/19/2016   HCT 36.5  07/19/2016   MCV 63.1 Repeated and verified X2. (L) 07/19/2016   PLT 280.0 07/19/2016        Lab Results  Component Value Date   TSH 1.12 07/19/2016     Lab Results  Component Value Date   PROBNP 133.0 (H) 07/19/2016             Also Labs ordered 07/19/2016    Allergy profile/ d dimer          Assessment:

## 2016-07-19 NOTE — Patient Instructions (Addendum)
Nexium 40 (or prilosec 20 x 2) Take 30- 60 min before your first and last meals of the day   GERD (REFLUX)  is an extremely common cause of respiratory symptoms just like yours , many times with no obvious heartburn at all.    It can be treated with medication, but also with lifestyle changes including elevation of the head of your bed (ideally with 6 inch  bed blocks),  Smoking cessation, avoidance of late meals, excessive alcohol, and avoid fatty foods, chocolate, peppermint, colas, red wine, and acidic juices such as orange juice.  NO MINT OR MENTHOL PRODUCTS SO NO COUGH DROPS  USE SUGARLESS CANDY INSTEAD (Jolley ranchers or Stover's or Life Savers) or even ice chips will also do - the key is to swallow to prevent all throat clearing. NO OIL BASED VITAMINS - use powdered substitutes.  Please remember to go to the lab and x-ray department downstairs in the basement  for your tests - we will call you with the results when they are available

## 2016-07-20 LAB — RESPIRATORY ALLERGY PROFILE REGION II ~~LOC~~
Allergen, A. alternata, m6: <0.1 kU/L
Allergen, C. Herbarum, M2: <0.1 kU/L
Allergen, Cedar tree, t12: <0.1 kU/L
Allergen, Comm Silver Birch, t9: <0.1 kU/L
Allergen, Cottonwood, t14: <0.1 kU/L
Allergen, D pternoyssinus,d7: 1.01 kU/L — ABNORMAL HIGH
Allergen, Mouse Urine Protein, e78: <0.1 kU/L
Allergen, Mulberry, t76: <0.1 kU/L
Allergen, Oak,t7: <0.1 kU/L
Allergen, P. notatum, m1: <0.1 kU/L
Aspergillus fumigatus, m3: <0.1 kU/L
Bermuda Grass: <0.1 kU/L
Box Elder IgE: <0.1 kU/L
Cat Dander: <0.1 kU/L
Cockroach: <0.1 kU/L
Common Ragweed: <0.1 kU/L
D. farinae: 0.75 kU/L — ABNORMAL HIGH
Dog Dander: <0.1 kU/L
Elm IgE: <0.1 kU/L
IgE (Immunoglobulin E), Serum: 192 kU/L — ABNORMAL HIGH (ref <115)
Johnson Grass: <0.1 kU/L
Pecan/Hickory Tree IgE: <0.1 kU/L
Rough Pigweed  IgE: <0.1 kU/L
Sheep Sorrel IgE: <0.1 kU/L
Timothy Grass: <0.1 kU/L

## 2016-07-20 LAB — D-DIMER, QUANTITATIVE: D-Dimer, Quant: 0.75 mcg/mL FEU — ABNORMAL HIGH (ref <0.50)

## 2016-07-20 NOTE — Assessment & Plan Note (Addendum)
Echo 12/14/15 Left ventricle: The cavity size was normal. Systolic function was   normal. The estimated ejection fraction was in the range of 60%   to 65%. Wall motion was normal; there were no regional wall   motion abnormalities. Doppler parameters are consistent with   abnormal left ventricular relaxation (grade 1 diastolic   dysfunction). - Aortic valve: Trileaflet; moderately calcified leaflets. There   was mild stenosis. There was mild regurgitation. Mean gradient   (S): 15 mm Hg. Peak gradient (S): 29 mm Hg. - Mitral valve: Moderately calcified annulus. Mildly calcified   leaflets . There was no significant regurgitation. - Left atrium: The atrium was mildly to moderately dilated. - Right ventricle: The cavity size was normal. Systolic function   was normal. Myoview  3/19/ 2018  Nl EF/ no CAD - Spirometry 07/19/2016   wnl > rec restart gerd rx/ treat back pain    Symptoms are markedly disproportionate to objective findings and not clear this is a lung problem but pt does appear to have difficult airway management issues. The differential diagnosis of difficult to control airways disorders is extensive with no quick and easy answers but easy to remember because it consists of 13 A's,  Two Bs and one C: 1. Adherence, always a challenge and the leading suspect.  - return with all meds in hand using a trust but verify approach to confirm accurate Medication  Reconciliation The principal here is that until we are certain that the  patients are doing what we've asked, it makes no sense to ask them to do more.  2. Acid reflux disease, with the greater proportion of pulmonary patients with no overt heartburn symptoms, and no easy way to treat non-acid reflux - she improved in terms of cough on gerd rx and has no recurrent cough or HB but as she had atypical symptoms before needs to resume max rx pending further w/u  3. Ace inhibitor use  n/a  4. Active sinus dz, best addressed by a sinus ct/ no  cough so unlikley  5. Active smoking,  Usually sureptitious in this setting 6. Allergic diseases, usually with a hx dating back to childhood with prominent allergic rhinitis features in up 90% of pts/ no rhinitis or atopic hx so unlikely  7. Aspiration, a perennial problem in the elderly or other patients at risk - no cough  8. Allergic Bronchopulmonary Aspergillosis, associated with IgE's in the thousands/ no asthma so unlikely   9. Alpha one Antitrypsin deficiency, a must screen in patients with chronic airflow obstruction syndromes out of proportion to smoking history - she has no airflow obst . 10. Adverse effect of inhalers, especially DPI's and especially with poor inhaler technique n/a 11  ? Anxiety > usually at the bottom of this list of usual suspects but should be much higher on this pt's based on H and P and note already on psychotropics > Follow up per Primary Care planned   12. A bunch of PE's ie moderately large clot burden, a few small ones peripherally can cause pleuritic cp syndromes but not unexplained dyspnea> check d dimer 13 Anemia or Thyroid disorders, easily excluded with standard labs but frequently overlooked in the chronically symptomatic/ frequent return pt. She has chronic microcytic indices out of proportion to hgb c/w thalasemia minor  Two B's 1. Bronchiectasis:  Pos CT is the sine que non here - unlikely s cough  2  Beta blocker effects:  Coreg and Timolol use are pervasive in  the adult population and both have significant spillover effects on the airways> should be ok with bisoprolol here  One C 1. Congestive heart failure,easily  ruled out now with BNP level of < 100 when symptomatic - she has AS and slt elevation of BNP but reassuring myoview and no orthopnea/ leg swelling to support   I had an extended discussion with the patient reviewing all relevant studies completed to date and  lasting 25 minutes of a 40  minute acute visit with new severe refractory problem  which is non-specific but potentially a very serious respiratory symptom  of unknown etiology.  Each maintenance medication was reviewed in detail including most importantly the difference between maintenance and prns and under what circumstances the prns are to be triggered using an action plan format that is not reflected in the computer generated alphabetically organized AVS.    Please see AVS for specific instructions unique to this office visit that I personally wrote and verbalized to the the pt in detail and then reviewed with pt  by my nurse highlighting any changes in therapy/plan of care  recommended at today's visit.

## 2016-07-23 DIAGNOSIS — M545 Low back pain: Secondary | ICD-10-CM | POA: Diagnosis not present

## 2016-07-24 ENCOUNTER — Telehealth: Payer: Self-pay | Admitting: Internal Medicine

## 2016-07-24 NOTE — Telephone Encounter (Signed)
Tanda Rockers, MD sent to Rosana Berger, CMA        Call patient : Studies are ok except mild dust allergy / the d dimer is not high enough to cause any concern about blood clots causing sob    Tanda Rockers, MD sent to Rosana Berger, CMA        Call pt: Reviewed cxr and no acute change so no change in recommendations made at [facility] with the pt's spouse and gave results  He will inform the pt  Nothing further needed

## 2016-08-02 DIAGNOSIS — M5441 Lumbago with sciatica, right side: Secondary | ICD-10-CM | POA: Diagnosis not present

## 2016-08-02 DIAGNOSIS — M545 Low back pain: Secondary | ICD-10-CM | POA: Diagnosis not present

## 2016-08-17 ENCOUNTER — Telehealth: Payer: Self-pay | Admitting: Endocrinology

## 2016-08-17 NOTE — Telephone Encounter (Signed)
Patient called stated her b/s are in the 50 range with insulin, he put the number to 150 with insulin NPH Human (NOVOLIN N) 100 UNIT/ML injection what number do her to be at. Please advise

## 2016-08-17 NOTE — Telephone Encounter (Signed)
Please reduce to 130 units qam. Please call us next week, to tell us how the blood sugar is doing

## 2016-08-17 NOTE — Telephone Encounter (Signed)
I contacted the patient and advised of message via voicemail. Requested a call back if the patient would like to further discuss.

## 2016-08-21 DIAGNOSIS — M48061 Spinal stenosis, lumbar region without neurogenic claudication: Secondary | ICD-10-CM | POA: Diagnosis not present

## 2016-08-28 ENCOUNTER — Other Ambulatory Visit: Payer: Self-pay | Admitting: Endocrinology

## 2016-08-29 NOTE — Telephone Encounter (Signed)
Gave patient advise and she stated an understanding

## 2016-08-29 NOTE — Telephone Encounter (Signed)
See message and please advise, Thanks!  

## 2016-08-29 NOTE — Telephone Encounter (Signed)
Ok, please reduce to 120 units qam

## 2016-08-29 NOTE — Telephone Encounter (Signed)
Breanna Webster Self [phone]  Paulino Door to say that Breanna Webster blood sugars are averaging around 84 in the mornings and she is not taking any medication in the evenings. She is taking Novalin 130 in the mornings

## 2016-09-10 DIAGNOSIS — M545 Low back pain: Secondary | ICD-10-CM | POA: Diagnosis not present

## 2016-09-10 DIAGNOSIS — M47817 Spondylosis without myelopathy or radiculopathy, lumbosacral region: Secondary | ICD-10-CM | POA: Diagnosis not present

## 2016-09-27 ENCOUNTER — Ambulatory Visit (INDEPENDENT_AMBULATORY_CARE_PROVIDER_SITE_OTHER): Payer: Medicare Other | Admitting: Endocrinology

## 2016-09-27 ENCOUNTER — Telehealth: Payer: Self-pay | Admitting: Adult Health

## 2016-09-27 ENCOUNTER — Telehealth: Payer: Self-pay | Admitting: Endocrinology

## 2016-09-27 ENCOUNTER — Encounter: Payer: Self-pay | Admitting: Endocrinology

## 2016-09-27 VITALS — BP 124/62 | HR 85 | Ht 65.0 in | Wt 198.0 lb

## 2016-09-27 DIAGNOSIS — E119 Type 2 diabetes mellitus without complications: Secondary | ICD-10-CM | POA: Diagnosis not present

## 2016-09-27 LAB — POCT GLYCOSYLATED HEMOGLOBIN (HGB A1C): Hemoglobin A1C: 7

## 2016-09-27 MED ORDER — VALACYCLOVIR HCL 1 G PO TABS: 1000.0000 mg | ORAL_TABLET | Freq: Three times a day (TID) | ORAL | 0 refills | Status: DC

## 2016-09-27 MED ORDER — INSULIN NPH (HUMAN) (ISOPHANE) 100 UNIT/ML ~~LOC~~ SUSP: 130.0000 [IU] | SUBCUTANEOUS | 2 refills | Status: DC

## 2016-09-27 NOTE — Telephone Encounter (Signed)
Pt's husband wants to know if you can check the web site "Let's Get Thin" and give feedback?  Pt's husband, is concerned if it is a safe program for his wife to be involved in as her friends are encouraging her join it as well.

## 2016-09-27 NOTE — Patient Instructions (Addendum)
Please change the NPH insulin to 160 units daily--all in the morning.  check your blood sugar twice a day.  vary the time of day when you check, between before the 3 meals, and at bedtime.  also check if you have symptoms of your blood sugar being too high or too low.  please keep a record of the readings and bring it to your next appointment here (or you can bring the meter itself).  You can write it on any piece of paper.  please call us sooner if your blood sugar goes below 70, or if you have a lot of readings over 200.   I have sent a prescription to your pharmacy, for the pill against the shingles.   Please come back for a follow-up appointment in 3 months.        Shingles Shingles, which is also known as herpes zoster, is an infection that causes a painful skin rash and fluid-filled blisters. Shingles is not related to genital herpes, which is a sexually transmitted infection. Shingles only develops in people who:  Have had chickenpox.  Have received the chickenpox vaccine. (This is rare.)  What are the causes? Shingles is caused by varicella-zoster virus (VZV). This is the same virus that causes chickenpox. After exposure to VZV, the virus stays in the body in an inactive (dormant) state. Shingles develops if the virus reactivates. This can happen many years after the initial exposure to VZV. It is not known what causes this virus to reactivate. What increases the risk? People who have had chickenpox or received the chickenpox vaccine are at risk for shingles. Infection is more common in people who:  Are older than age 79.  Have a weakened defense (immune) system, such as those with HIV, AIDS, or cancer.  Are taking medicines that weaken the immune system, such as transplant medicines.  Are under great stress.  What are the signs or symptoms? Early symptoms of this condition include itching, tingling, and pain in an area on your skin. Pain may be described as burning, stabbing,  or throbbing. A few days or weeks after symptoms start, a painful red rash appears, usually on one side of the body in a bandlike or beltlike pattern. The rash eventually turns into fluid-filled blisters that break open, scab over, and dry up in about 2-3 weeks. At any time during the infection, you may also develop:  A fever.  Chills.  A headache.  An upset stomach.  How is this diagnosed? This condition is diagnosed with a skin exam. Sometimes, skin or fluid samples are taken from the blisters before a diagnosis is made. These samples are examined under a microscope or sent to a lab for testing. How is this treated? There is no specific cure for this condition. Your health care provider will probably prescribe medicines to help you manage pain, recover more quickly, and avoid long-term problems. Medicines may include:  Antiviral drugs.  Anti-inflammatory drugs.  Pain medicines.  If the area involved is on your face, you may be referred to a specialist, such as an eye doctor (ophthalmologist) or an ear, nose, and throat (ENT) doctor to help you avoid eye problems, chronic pain, or disability. Follow these instructions at home: Medicines  Take medicines only as directed by your health care provider.  Apply an anti-itch or numbing cream to the affected area as directed by your health care provider. Blister and Rash Care  Take a cool bath or apply cool compresses to the  area of the rash or blisters as directed by your health care provider. This may help with pain and itching.  Keep your rash covered with a loose bandage (dressing). Wear loose-fitting clothing to help ease the pain of material rubbing against the rash.  Keep your rash and blisters clean with mild soap and cool water or as directed by your health care provider.  Check your rash every day for signs of infection. These include redness, swelling, and pain that lasts or increases.  Do not pick your blisters.  Do not  scratch your rash. General instructions  Rest as directed by your health care provider.  Keep all follow-up visits as directed by your health care provider. This is important.  Until your blisters scab over, your infection can cause chickenpox in people who have never had it or been vaccinated against it. To prevent this from happening, avoid contact with other people, especially: ? Babies. ? Pregnant women. ? Children who have eczema. ? Elderly people who have transplants. ? People who have chronic illnesses, such as leukemia or AIDS. Contact a health care provider if:  Your pain is not relieved with prescribed medicines.  Your pain does not get better after the rash heals.  Your rash looks infected. Signs of infection include redness, swelling, and pain that lasts or increases. Get help right away if:  The rash is on your face or nose.  You have facial pain, pain around your eye area, or loss of feeling on one side of your face.  You have ear pain or you have ringing in your ear.  You have loss of taste.  Your condition gets worse. This information is not intended to replace advice given to you by your health care provider. Make sure you discuss any questions you have with your health care provider. Document Released: 04/09/2005 Document Revised: 12/04/2015 Document Reviewed: 02/18/2014 Elsevier Interactive Patient Education  2017 [employer].

## 2016-09-27 NOTE — Telephone Encounter (Signed)
please call patient: There was a mistake on your paper: it should say: continue 130 units qam. I'll see you next time.

## 2016-09-27 NOTE — Progress Notes (Signed)
Subjective:    Patient ID: Breanna Webster, female    DOB: [birth year], 72 y.o.   MRN: [id number]  HPI Pt returns for f/u of diabetes mellitus: DM type: Insulin-requiring type 2 Dx'ed: 2003.  Complications: none.   Therapy: insulin since 2017.     GDM: never.   DKA: never.  Severe hypoglycemia: never.  Pancreatitis: never.  Other: she stopped metformin-XR due to nausea and diarrhea; pioglitizone is precluded by edema.  she takes human insulin, due to cost.  Interval history: no cbg record, but states cbg's are in the low-100's.  She checks fasting only.  She takes 130 units qam.   Pt states 3 days of moderate pain at the left ant neck, and assoc pain.   Past Medical History:  Diagnosis Date  . Arthritis   . Carotid artery stenosis    Mild  . Colon polyps   . Diabetes mellitus   . Diverticulitis   . Diverticulosis   . GERD (gastroesophageal reflux disease)   . Grave's disease   . Hyperlipidemia   . Hypertension   . IBS (irritable bowel syndrome)   . Osteopenia   . Thalassemia minor     Past Surgical History:  Procedure Laterality Date  . CHOLECYSTECTOMY    . HIATAL HERNIA REPAIR    . HYSTEROSCOPY     fibroids  . laparoscopy     fibroids    Social History   Social History  . Marital status: Married    Spouse name: N/A  . Number of children: 2  . Years of education: N/A   Occupational History  . fundraising    Social History Main Topics  . Smoking status: Never Smoker  . Smokeless tobacco: Never Used  . Alcohol use No  . Drug use: No  . Sexual activity: Not on file   Other Topics Concern  . Not on file   Social History Narrative   Lives with husband in a one story home.     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   Education: college.    Current Outpatient Prescriptions on File Prior to Visit  Medication Sig Dispense Refill  . aspirin 81 MG tablet Take 81 mg by mouth daily.       . bisoprolol (ZEBETA) 5 MG tablet Take 1 tablet (5 mg total) by mouth daily. 30 tablet 11  . escitalopram (LEXAPRO) 20 MG tablet Take 1 tablet (20 mg total) by mouth daily. 90 tablet 1  . glucose blood (ONETOUCH VERIO) test strip Use to check blood sugar 2 times per day. Dx Code: E11.8 200 each 2  . hydrochlorothiazide (HYDRODIURIL) 12.5 MG tablet Take 1 tablet (12.5 mg total) by mouth daily. 90 tablet 3  . Insulin Syringe-Needle U-100 (INSULIN SYRINGE .5CC/30GX1/2") 30G X 1/2" 0.5 ML MISC Use to inject insulin 2 times per day. 100 each 2  . Multiple Vitamin (MULITIVITAMIN WITH MINERALS) TABS Take 1 tablet by mouth daily.    . nortriptyline (PAMELOR) 25 MG capsule Take 1 capsule (25 mg total) by mouth at bedtime. 90 capsule 3  . ONETOUCH DELICA LANCETS 44W MISC Use to check blood sugar 2 times per day. Dx Code: E11.8 200 each 1  . SYNTHROID 150 MCG tablet take 1 tablet by mouth once daily 90 tablet 3  . valsartan (DIOVAN) 160 MG tablet Take 1 tablet (160 mg total) by mouth daily. 90 tablet 3  .  esomeprazole (NEXIUM) 40 MG capsule Take 1 capsule (40 mg total) by mouth 2 (two) times daily before a meal. (Patient not taking: Reported on 09/27/2016) 60 capsule 2   No current facility-administered medications on file prior to visit.     Allergies  Allergen Reactions  . Statins Other (See Comments)    Muscle aches    Family History  Problem Relation Age of Onset  . Adopted: Yes  . Healthy Son        x 2  . Headache Unknown        Cluster headaches  . Colon cancer Neg Hx   . Pancreatic cancer Neg Hx   . Rectal cancer Neg Hx   . Stomach cancer Neg Hx     BP 124/62   Pulse 85   Ht 5' 5"  (1.651 m)   Wt 198 lb (89.8 kg)   SpO2 97%   BMI 32.95 kg/m    Review of Systems Denies fever and hypoglycemia.     Objective:   Physical Exam VITAL SIGNS:  See vs page GENERAL: no distress Pulses: dorsalis pedis intact bilat.   MSK: no deformity of the feet CV: no leg edema Skin:  no  ulcer on the feet.  normal color and temp on the feet.  There is a cluster of vesicles at the left ant neck.   Neuro: sensation is intact to touch on the feet.   A1c=7.0%     Assessment & Plan:  Insulin-requiring type 2 DM: this is the best control this pt should aim for, given this regimen, which does match insulin to her changing needs throughout the day Zoster, new.   Patient Instructions  Please change the NPH insulin to 160 units daily--all in the morning.  check your blood sugar twice a day.  vary the time of day when you check, between before the 3 meals, and at bedtime.  also check if you have symptoms of your blood sugar being too high or too low.  please keep a record of the readings and bring it to your next appointment here (or you can bring the meter itself).  You can write it on any piece of paper.  please call us sooner if your blood sugar goes below 70, or if you have a lot of readings over 200.   I have sent a prescription to your pharmacy, for the pill against the shingles.   Please come back for a follow-up appointment in 3 months.        Shingles Shingles, which is also known as herpes zoster, is an infection that causes a painful skin rash and fluid-filled blisters. Shingles is not related to genital herpes, which is a sexually transmitted infection. Shingles only develops in people who:  Have had chickenpox.  Have received the chickenpox vaccine. (This is rare.)  What are the causes? Shingles is caused by varicella-zoster virus (VZV). This is the same virus that causes chickenpox. After exposure to VZV, the virus stays in the body in an inactive (dormant) state. Shingles develops if the virus reactivates. This can happen many years after the initial exposure to VZV. It is not known what causes this virus to reactivate. What increases the risk? People who have had chickenpox or received the chickenpox vaccine are at risk for shingles. Infection is more common in  people who:  Are older than age 21.  Have a weakened defense (immune) system, such as those with HIV, AIDS, or cancer.  Are taking  medicines that weaken the immune system, such as transplant medicines.  Are under great stress.  What are the signs or symptoms? Early symptoms of this condition include itching, tingling, and pain in an area on your skin. Pain may be described as burning, stabbing, or throbbing. A few days or weeks after symptoms start, a painful red rash appears, usually on one side of the body in a bandlike or beltlike pattern. The rash eventually turns into fluid-filled blisters that break open, scab over, and dry up in about 2-3 weeks. At any time during the infection, you may also develop:  A fever.  Chills.  A headache.  An upset stomach.  How is this diagnosed? This condition is diagnosed with a skin exam. Sometimes, skin or fluid samples are taken from the blisters before a diagnosis is made. These samples are examined under a microscope or sent to a lab for testing. How is this treated? There is no specific cure for this condition. Your health care provider will probably prescribe medicines to help you manage pain, recover more quickly, and avoid long-term problems. Medicines may include:  Antiviral drugs.  Anti-inflammatory drugs.  Pain medicines.  If the area involved is on your face, you may be referred to a specialist, such as an eye doctor (ophthalmologist) or an ear, nose, and throat (ENT) doctor to help you avoid eye problems, chronic pain, or disability. Follow these instructions at home: Medicines  Take medicines only as directed by your health care provider.  Apply an anti-itch or numbing cream to the affected area as directed by your health care provider. Blister and Rash Care  Take a cool bath or apply cool compresses to the area of the rash or blisters as directed by your health care provider. This may help with pain and itching.  Keep  your rash covered with a loose bandage (dressing). Wear loose-fitting clothing to help ease the pain of material rubbing against the rash.  Keep your rash and blisters clean with mild soap and cool water or as directed by your health care provider.  Check your rash every day for signs of infection. These include redness, swelling, and pain that lasts or increases.  Do not pick your blisters.  Do not scratch your rash. General instructions  Rest as directed by your health care provider.  Keep all follow-up visits as directed by your health care provider. This is important.  Until your blisters scab over, your infection can cause chickenpox in people who have never had it or been vaccinated against it. To prevent this from happening, avoid contact with other people, especially: ? Babies. ? Pregnant women. ? Children who have eczema. ? Elderly people who have transplants. ? People who have chronic illnesses, such as leukemia or AIDS. Contact a health care provider if:  Your pain is not relieved with prescribed medicines.  Your pain does not get better after the rash heals.  Your rash looks infected. Signs of infection include redness, swelling, and pain that lasts or increases. Get help right away if:  The rash is on your face or nose.  You have facial pain, pain around your eye area, or loss of feeling on one side of your face.  You have ear pain or you have ringing in your ear.  You have loss of taste.  Your condition gets worse. This information is not intended to replace advice given to you by your health care provider. Make sure you discuss any questions you have with  your health care provider. Document Released: 04/09/2005 Document Revised: 12/04/2015 Document Reviewed: 02/18/2014 Elsevier Interactive Patient Education  2017 [employer].

## 2016-09-27 NOTE — Telephone Encounter (Signed)
Form what I can tell this is a Hcg diet plan. HCG is a hormone that the body makes when you are pregnant.   Most HCG diet plans limits you to 500 calories a day for 8 weeks while taking hCG . Any diet that limits you to 500 calories or so is going to cause weight loss.   Using HcG is not FDA approved for weight loss, but it is not illegal to do  I would not waste your money   If you are interested, we have a weight loss clinic through [facility]. Syona can come and I can discuss it with her.

## 2016-09-28 NOTE — Telephone Encounter (Signed)
Patient is aware 

## 2016-09-28 NOTE — Telephone Encounter (Signed)
Patient called back and I informed her of the message below and scheduled an appt for 6/13 to discuss this with [facility].

## 2016-09-28 NOTE — Telephone Encounter (Signed)
I left a message for the pt to return my call. 

## 2016-10-01 DIAGNOSIS — M47817 Spondylosis without myelopathy or radiculopathy, lumbosacral region: Secondary | ICD-10-CM | POA: Diagnosis not present

## 2016-10-01 DIAGNOSIS — M545 Low back pain: Secondary | ICD-10-CM | POA: Diagnosis not present

## 2016-10-03 ENCOUNTER — Encounter: Payer: Self-pay | Admitting: Adult Health

## 2016-10-03 ENCOUNTER — Ambulatory Visit (INDEPENDENT_AMBULATORY_CARE_PROVIDER_SITE_OTHER): Payer: Medicare Other | Admitting: Adult Health

## 2016-10-03 VITALS — BP 118/64 | Temp 98.4°F | Ht 65.0 in | Wt 198.1 lb

## 2016-10-03 DIAGNOSIS — R634 Abnormal weight loss: Secondary | ICD-10-CM | POA: Diagnosis not present

## 2016-10-03 NOTE — Progress Notes (Signed)
Subjective:    Patient ID: Breanna Webster, female    DOB: 05-16-1945, 71 y.o.   MRN: [id number]  HPI  71 year old female who  has a past medical history of Arthritis; Carotid artery stenosis; Colon polyps; Diabetes mellitus; Diverticulitis; Diverticulosis; GERD (gastroesophageal reflux disease); Grave's disease; Hyperlipidemia; Hypertension; IBS (irritable bowel syndrome); Osteopenia; and Thalassemia minor. She presents to the office today for an opinion on weight loss. She has been using weight watchers but has only been able to lose 1-2 pounds. She is interesting in doing a B12/growth hormone/low calorie diet through a weight loss clinic.   She reports trying to to eat healthy but is not walking as much she would like due to back pain. She is having a steroid injection and nerve burn in her lower back within the next month and is hopeful that she will be able to walk more after that.   Despite this, she is adamant about losing weight.   Wt Readings from Last 3 Encounters:  10/03/16 198 lb 1.6 oz (89.9 kg)  09/27/16 198 lb (89.8 kg)  07/09/16 196 lb (88.9 kg)    Review of Systems See HPI   Past Medical History:  Diagnosis Date  . Arthritis   . Carotid artery stenosis    Mild  . Colon polyps   . Diabetes mellitus   . Diverticulitis   . Diverticulosis   . GERD (gastroesophageal reflux disease)   . Grave's disease   . Hyperlipidemia   . Hypertension   . IBS (irritable bowel syndrome)   . Osteopenia   . Thalassemia minor     Social History   Social History  . Marital status: Married    Spouse name: N/A  . Number of children: 2  . Years of education: N/A   Occupational History  . fundraising    Social History Main Topics  . Smoking status: Never Smoker  . Smokeless tobacco: Never Used  . Alcohol use No  . Drug use: No  . Sexual activity: Not on file   Other Topics Concern  . Not on file   Social History Narrative   Lives with husband in a one story home.      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   Education: college.    Past Surgical History:  Procedure Laterality Date  . CHOLECYSTECTOMY    . HIATAL HERNIA REPAIR    . HYSTEROSCOPY     fibroids  . laparoscopy     fibroids    Family History  Problem Relation Age of Onset  . Adopted: Yes  . Healthy Son        x 2  . Headache Unknown        Cluster headaches  . Colon cancer Neg Hx   . Pancreatic cancer Neg Hx   . Rectal cancer Neg Hx   . Stomach cancer Neg Hx     Allergies  Allergen Reactions  . Statins Other (See Comments)    Muscle aches    Current Outpatient Prescriptions on File Prior to Visit  Medication Sig Dispense Refill  . aspirin 81 MG tablet Take 81 mg by mouth daily.      . bisoprolol (ZEBETA) 5 MG tablet Take 1 tablet (5 mg total) by mouth daily. 30 tablet 11  . escitalopram (LEXAPRO) 20 MG tablet Take 1 tablet (20 mg total) by mouth daily. 90 tablet 1  .  esomeprazole (NEXIUM) 40 MG capsule Take 1 capsule (40 mg total) by mouth 2 (two) times daily before a meal. 60 capsule 2  . glucose blood (ONETOUCH VERIO) test strip Use to check blood sugar 2 times per day. Dx Code: E11.8 200 each 2  . hydrochlorothiazide (HYDRODIURIL) 12.5 MG tablet Take 1 tablet (12.5 mg total) by mouth daily. 90 tablet 3  . insulin NPH Human (NOVOLIN N RELION) 100 UNIT/ML injection Inject 1.3 mLs (130 Units total) into the skin every morning. 50 mL 2  . Insulin Syringe-Needle U-100 (INSULIN SYRINGE .5CC/30GX1/2") 30G X 1/2" 0.5 ML MISC Use to inject insulin 2 times per day. 100 each 2  . Multiple Vitamin (MULITIVITAMIN WITH MINERALS) TABS Take 1 tablet by mouth daily.    . nortriptyline (PAMELOR) 25 MG capsule Take 1 capsule (25 mg total) by mouth at bedtime. 90 capsule 3  . ONETOUCH DELICA LANCETS 38H MISC Use to check blood sugar 2 times per day. Dx Code: E11.8 200 each 1  . SYNTHROID 150 MCG tablet take 1 tablet by  mouth once daily 90 tablet 3  . valACYclovir (VALTREX) 1000 MG tablet Take 1 tablet (1,000 mg total) by mouth 3 (three) times daily. 21 tablet 0  . valsartan (DIOVAN) 160 MG tablet Take 1 tablet (160 mg total) by mouth daily. 90 tablet 3   No current facility-administered medications on file prior to visit.     BP 118/64 (BP Location: Left Arm, Patient Position: Sitting, Cuff Size: Normal)   Temp 98.4 F (36.9 C) (Oral)   Ht 5' 5"  (1.651 m)   Wt 198 lb 1.6 oz (89.9 kg)   BMI 32.97 kg/m       Objective:   Physical Exam  Constitutional: She is oriented to person, place, and time. She appears well-developed and well-nourished. No distress.  Overweight   Cardiovascular: Normal rate, regular rhythm, normal heart sounds and intact distal pulses.  Exam reveals no gallop and no friction rub.   No murmur heard. Pulmonary/Chest: Effort normal and breath sounds normal. No respiratory distress. She has no wheezes. She has no rales. She exhibits no tenderness.  Neurological: She is alert and oriented to person, place, and time.  Skin: Skin is warm and dry. No rash noted. She is not diaphoretic. No erythema. No pallor.  Psychiatric: She has a normal mood and affect. Her behavior is normal. Judgment and thought content normal.  Nursing note and vitals reviewed.     Assessment & Plan:  1. Weight loss - We spoke about various ways to lose weight. I am not comfortable with her doing this HCG diet. I explained to her that HCG is not FDA approved for weight loss and that these diets are usually 500 calorie diets. We spoke about wellbutrin and [facility]. She is not morbidly obese but she would like to talk to Dr. Shary Decamp. She may not be a prime canidate for Dr. Georga Hacking program but I will refer her.  - My hope is that after she has the nerve burn in her back she will be able to walk more comfortably.  - Amb Ref to [facility] Weight Management - Follow up as needed  [name],  NP

## 2016-10-04 DIAGNOSIS — M545 Low back pain: Secondary | ICD-10-CM | POA: Diagnosis not present

## 2016-10-04 DIAGNOSIS — M47817 Spondylosis without myelopathy or radiculopathy, lumbosacral region: Secondary | ICD-10-CM | POA: Diagnosis not present

## 2016-10-15 ENCOUNTER — Other Ambulatory Visit: Payer: Self-pay | Admitting: Internal Medicine

## 2016-10-18 DIAGNOSIS — M47817 Spondylosis without myelopathy or radiculopathy, lumbosacral region: Secondary | ICD-10-CM | POA: Diagnosis not present

## 2016-10-18 DIAGNOSIS — M545 Low back pain: Secondary | ICD-10-CM | POA: Diagnosis not present

## 2016-11-02 DIAGNOSIS — M545 Low back pain: Secondary | ICD-10-CM | POA: Diagnosis not present

## 2016-11-02 DIAGNOSIS — M47817 Spondylosis without myelopathy or radiculopathy, lumbosacral region: Secondary | ICD-10-CM | POA: Diagnosis not present

## 2016-11-07 ENCOUNTER — Telehealth: Payer: Self-pay | Admitting: Adult Health

## 2016-11-07 MED ORDER — LOSARTAN POTASSIUM 50 MG PO TABS: 50.0000 mg | ORAL_TABLET | Freq: Every day | ORAL | 1 refills | Status: DC

## 2016-11-07 NOTE — Telephone Encounter (Signed)
Pts husband is calling very nervous about hearing that his valsartan is being recalled.  Pt would like to have something called in today so that she can start taking it in the a.m. Tomorrow morning.  Pharm:  [facility] on [address] with Roselyn Reef the pharmacist and suggested talmasartan or losartan.

## 2016-11-07 NOTE — Telephone Encounter (Signed)
Cozaar 50 mg, 30 pills, 1 refill to start. Follow up in 1-2 weeks for recheck

## 2016-11-07 NOTE — Telephone Encounter (Signed)
Spoke to [place] to clarify.  Pt has received medication from one of the two distributors in questions.  Please advise.

## 2016-11-07 NOTE — Telephone Encounter (Signed)
° ° ° °  Pharmacy call to say which med pt will be switch to since they are the below med  valsartan (DIOVAN) 160 MG tablet    [place] line

## 2016-11-07 NOTE — Telephone Encounter (Signed)
Sent to the pharmacy.  Pt's husband notified.  Scheduled to come see [facility] on 11/14/16 @ 1:15 PM

## 2016-11-08 DIAGNOSIS — M545 Low back pain: Secondary | ICD-10-CM | POA: Diagnosis not present

## 2016-11-08 DIAGNOSIS — M47817 Spondylosis without myelopathy or radiculopathy, lumbosacral region: Secondary | ICD-10-CM | POA: Diagnosis not present

## 2016-11-14 ENCOUNTER — Ambulatory Visit (INDEPENDENT_AMBULATORY_CARE_PROVIDER_SITE_OTHER): Payer: Medicare Other | Admitting: Adult Health

## 2016-11-14 ENCOUNTER — Encounter: Payer: Self-pay | Admitting: Adult Health

## 2016-11-14 VITALS — BP 112/64 | HR 74 | Temp 98.2°F | Ht 65.0 in | Wt 203.0 lb

## 2016-11-14 DIAGNOSIS — I10 Essential (primary) hypertension: Secondary | ICD-10-CM

## 2016-11-14 NOTE — Progress Notes (Signed)
Subjective:    Patient ID: Breanna Webster, female    DOB: Oct 29, 1945, 71 y.o.   MRN: [id number]  HPI   71 year old female who  has a past medical history of Arthritis; Carotid artery stenosis; Colon polyps; Diabetes mellitus; Diverticulitis; Diverticulosis; GERD (gastroesophageal reflux disease); Grave's disease; Hyperlipidemia; Hypertension; IBS (irritable bowel syndrome); Osteopenia; and Thalassemia minor. She presents to the office today for one week follow up on blood pressure. Her current prescription for valsartan was involved in the recall due to potential for cancer. She was switched to Cozaar 50 mg daily.   Today in the office she reports that she has had no side effects of cozaar. Her blood pressure has been well controlled.    Review of Systems  Constitutional: Negative.   Respiratory: Negative.   Cardiovascular: Negative.   Gastrointestinal: Negative.   Genitourinary: Negative.   Musculoskeletal: Negative.   Neurological: Negative.   All other systems reviewed and are negative.  Past Medical History:  Diagnosis Date  . Arthritis   . Carotid artery stenosis    Mild  . Colon polyps   . Diabetes mellitus   . Diverticulitis   . Diverticulosis   . GERD (gastroesophageal reflux disease)   . Grave's disease   . Hyperlipidemia   . Hypertension   . IBS (irritable bowel syndrome)   . Osteopenia   . Thalassemia minor     Social History   Social History  . Marital status: Married    Spouse name: N/A  . Number of children: 2  . Years of education: N/A   Occupational History  . fundraising    Social History Main Topics  . Smoking status: Never Smoker  . Smokeless tobacco: Never Used  . Alcohol use No  . Drug use: No  . Sexual activity: Not on file   Other Topics Concern  . Not on file   Social History Narrative   Lives with husband in a one story home.     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   Education: college.    Past Surgical History:  Procedure Laterality Date  . CHOLECYSTECTOMY    . HIATAL HERNIA REPAIR    . HYSTEROSCOPY     fibroids  . laparoscopy     fibroids    Family History  Problem Relation Age of Onset  . Adopted: Yes  . Healthy Son        x 2  . Headache Unknown        Cluster headaches  . Colon cancer Neg Hx   . Pancreatic cancer Neg Hx   . Rectal cancer Neg Hx   . Stomach cancer Neg Hx     Allergies  Allergen Reactions  . Statins Other (See Comments)    Muscle aches    Current Outpatient Prescriptions on File Prior to Visit  Medication Sig Dispense Refill  . aspirin 81 MG tablet Take 81 mg by mouth daily.      . bisoprolol (ZEBETA) 5 MG tablet take 1 tablet by mouth once daily 30 tablet 0  . escitalopram (LEXAPRO) 20 MG tablet Take 1 tablet (20 mg total) by mouth daily. 90 tablet 1  . glucose blood (ONETOUCH VERIO) test strip Use to check blood sugar 2 times per day. Dx Code: E11.8 200 each 2  . hydrochlorothiazide (HYDRODIURIL) 12.5 MG tablet Take 1 tablet (12.5 mg total) by mouth daily.  90 tablet 3  . insulin NPH Human (NOVOLIN N RELION) 100 UNIT/ML injection Inject 1.3 mLs (130 Units total) into the skin every morning. 50 mL 2  . Insulin Syringe-Needle U-100 (INSULIN SYRINGE .5CC/30GX1/2") 30G X 1/2" 0.5 ML MISC Use to inject insulin 2 times per day. 100 each 2  . losartan (COZAAR) 50 MG tablet Take 1 tablet (50 mg total) by mouth daily. 30 tablet 1  . Multiple Vitamin (MULITIVITAMIN WITH MINERALS) TABS Take 1 tablet by mouth daily.    . nortriptyline (PAMELOR) 25 MG capsule Take 1 capsule (25 mg total) by mouth at bedtime. 90 capsule 3  . ONETOUCH DELICA LANCETS 32I MISC Use to check blood sugar 2 times per day. Dx Code: E11.8 200 each 1  . SYNTHROID 150 MCG tablet take 1 tablet by mouth once daily 90 tablet 3  . esomeprazole (NEXIUM) 40 MG capsule Take 1 capsule (40 mg total) by mouth 2 (two) times daily before a meal.  (Patient not taking: Reported on 11/14/2016) 60 capsule 2  . valACYclovir (VALTREX) 1000 MG tablet Take 1 tablet (1,000 mg total) by mouth 3 (three) times daily. (Patient not taking: Reported on 11/14/2016) 21 tablet 0  . valsartan (DIOVAN) 160 MG tablet Take 1 tablet (160 mg total) by mouth daily. (Patient not taking: Reported on 11/14/2016) 90 tablet 3   No current facility-administered medications on file prior to visit.     BP 112/64 (BP Location: Left Arm, Patient Position: Sitting, Cuff Size: Normal)   Pulse 74   Temp 98.2 F (36.8 C) (Oral)   Ht 5' 5"  (1.651 m)   Wt 203 lb (92.1 kg)   SpO2 94%   BMI 33.78 kg/m       Objective:   Physical Exam  Constitutional: She is oriented to person, place, and time. She appears well-developed and well-nourished. No distress.  Cardiovascular: Normal rate, regular rhythm, normal heart sounds and intact distal pulses.  Exam reveals no gallop and no friction rub.   No murmur heard. Pulmonary/Chest: Effort normal and breath sounds normal. No respiratory distress. She has no wheezes. She has no rales. She exhibits no tenderness.  Musculoskeletal: Normal range of motion.  Neurological: She is alert and oriented to person, place, and time.  Skin: Skin is warm and dry. No rash noted. She is not diaphoretic. No erythema. No pallor.  Psychiatric: She has a normal mood and affect. Her behavior is normal. Judgment and thought content normal.  Nursing note and vitals reviewed.     Assessment & Plan:  1. Essential hypertension, benign - Continue with current dose of Cozaar  - Follow up as needed  [name], NP

## 2016-11-15 ENCOUNTER — Encounter (INDEPENDENT_AMBULATORY_CARE_PROVIDER_SITE_OTHER): Payer: Self-pay | Admitting: Family Medicine

## 2016-11-21 ENCOUNTER — Encounter (INDEPENDENT_AMBULATORY_CARE_PROVIDER_SITE_OTHER): Payer: Self-pay | Admitting: Family Medicine

## 2016-11-21 ENCOUNTER — Ambulatory Visit (INDEPENDENT_AMBULATORY_CARE_PROVIDER_SITE_OTHER): Payer: Medicare Other | Admitting: Family Medicine

## 2016-11-21 VITALS — BP 125/69 | HR 72 | Temp 97.9°F | Ht 65.0 in | Wt 198.0 lb

## 2016-11-21 DIAGNOSIS — Z0289 Encounter for other administrative examinations: Secondary | ICD-10-CM

## 2016-11-21 DIAGNOSIS — R0602 Shortness of breath: Secondary | ICD-10-CM | POA: Diagnosis not present

## 2016-11-21 DIAGNOSIS — Z6833 Body mass index (BMI) 33.0-33.9, adult: Secondary | ICD-10-CM

## 2016-11-21 DIAGNOSIS — I10 Essential (primary) hypertension: Secondary | ICD-10-CM

## 2016-11-21 DIAGNOSIS — Z1331 Encounter for screening for depression: Secondary | ICD-10-CM

## 2016-11-21 DIAGNOSIS — R5383 Other fatigue: Secondary | ICD-10-CM | POA: Diagnosis not present

## 2016-11-21 DIAGNOSIS — E559 Vitamin D deficiency, unspecified: Secondary | ICD-10-CM

## 2016-11-21 DIAGNOSIS — E119 Type 2 diabetes mellitus without complications: Secondary | ICD-10-CM

## 2016-11-21 DIAGNOSIS — Z794 Long term (current) use of insulin: Secondary | ICD-10-CM

## 2016-11-21 DIAGNOSIS — E669 Obesity, unspecified: Secondary | ICD-10-CM

## 2016-11-21 DIAGNOSIS — E038 Other specified hypothyroidism: Secondary | ICD-10-CM

## 2016-11-21 DIAGNOSIS — Z1389 Encounter for screening for other disorder: Secondary | ICD-10-CM

## 2016-11-21 NOTE — Progress Notes (Signed)
Office: [phone]  /  Fax: [phone]   HPI:   Chief Complaint: OBESITY  Breanna Webster (MR# [id number]) is a 71 y.o. female who presents on 11/21/2016 for obesity evaluation and treatment. Current BMI is Body mass index is 32.95 kg/m.Breanna Webster has struggled with obesity for years and has been unsuccessful in either losing weight or maintaining long term weight loss. Breanna Webster attended our information session and states she is currently in the action stage of change and ready to dedicate time achieving and maintaining a healthier weight.  Breanna Webster states her family eats meals together she thinks her family will eat healthier with  her she struggles with family and or coworkers weight loss sabotage her desired weight loss is 43 lbs she has been heavy most of  her life she started gaining weight after 58 and with diabetes medications her heaviest weight ever was 203 lbs. she has significant food cravings issues  she snacks frequently in the evenings she has problems with excessive hunger  she has binge eating behaviors she struggles with emotional eating    Fatigue Breanna Webster feels her energy is lower than it should be. This has worsened with weight gain and has not worsened recently. Breanna Webster admits to daytime somnolence and  admits to waking up still tired. Patient is at risk for obstructive sleep apnea. Patent has a history of symptoms of daytime fatigue and morning fatigue. Patient generally gets 8 hours of sleep per night, and states they generally have restful sleep. Snoring is present. Apneic episodes are not present. Epworth Sleepiness Score is 13  Dyspnea on exertion Breanna Webster notes increasing shortness of breath with exercising and seems to be worsening over time with weight gain. She notes getting out of breath sooner with activity than she used to. This has not gotten worse recently. Breanna Webster denies orthopnea.  Vitamin D deficiency Breanna Webster has a diagnosis of vitamin D deficiency. She is currently taking  OTC vit D and admits fatigue but denies nausea, vomiting or muscle weakness.  Diabetes II Breanna Webster has a diagnosis of diabetes type II and is on BPH insulin because she didn't tolerate metformin and NPH is the cheapest option.Breanna Webster states BGs range between 120 and 140's and denies any hypoglycemic episodes. Last A1c was at 7.0 She is attempting to work on intensive lifestyle modifications including diet, exercise, and weight loss to help control her blood glucose levels.  Hypothyroid Breanna Webster has a diagnosis of hypothyroidism. She is status post ablation and is on synthroid. She denies hot or cold intolerance or palpitations, but does admit to ongoing fatigue.  Hypertension Breanna Webster is a 71 y.o. female with hypertension. Her blood pressure is stable on medications and she denies chest pain, headache or lightheadedness. She is working weight loss to help control her blood pressure with the goal of decreasing her risk of heart attack and stroke. Breanna Webster blood pressure is currently controlled.   Depression Screen Breanna Webster Food and Mood (modified PHQ-9) score was  Depression screen PHQ 2/9 11/21/2016  Decreased Interest 3  Down, Depressed, Hopeless 1  PHQ - 2 Score 4  Altered sleeping 0  Tired, decreased energy 2  Change in appetite 1  Feeling bad or failure about yourself  1  Trouble concentrating 0  Moving slowly or fidgety/restless 0  Suicidal thoughts 0  PHQ-9 Score 8  Some recent data might be hidden    ALLERGIES: Allergies  Allergen Reactions  . Statins Other (See Comments)    Muscle aches  MEDICATIONS: Current Outpatient Prescriptions on File Prior to Visit  Medication Sig Dispense Refill  . aspirin 81 MG tablet Take 81 mg by mouth daily.      . bisoprolol (ZEBETA) 5 MG tablet take 1 tablet by mouth once daily 30 tablet 0  . escitalopram (LEXAPRO) 20 MG tablet Take 1 tablet (20 mg total) by mouth daily. 90 tablet 1  . glucose blood (ONETOUCH VERIO) test strip Use to check  blood sugar 2 times per day. Dx Code: E11.8 200 each 2  . hydrochlorothiazide (HYDRODIURIL) 12.5 MG tablet Take 1 tablet (12.5 mg total) by mouth daily. 90 tablet 3  . insulin NPH Human (NOVOLIN N RELION) 100 UNIT/ML injection Inject 1.3 mLs (130 Units total) into the skin every morning. 50 mL 2  . losartan (COZAAR) 50 MG tablet Take 1 tablet (50 mg total) by mouth daily. 30 tablet 1  . Multiple Vitamin (MULITIVITAMIN WITH MINERALS) TABS Take 1 tablet by mouth daily.    . nortriptyline (PAMELOR) 25 MG capsule Take 1 capsule (25 mg total) by mouth at bedtime. 90 capsule 3  . ONETOUCH DELICA LANCETS 93Y MISC Use to check blood sugar 2 times per day. Dx Code: E11.8 200 each 1  . SYNTHROID 150 MCG tablet take 1 tablet by mouth once daily 90 tablet 3  . esomeprazole (NEXIUM) 40 MG capsule Take 1 capsule (40 mg total) by mouth 2 (two) times daily before a meal. (Patient not taking: Reported on 11/14/2016) 60 capsule 2  . Insulin Syringe-Needle U-100 (INSULIN SYRINGE .5CC/30GX1/2") 30G X 1/2" 0.5 ML MISC Use to inject insulin 2 times per day. 100 each 2  . valACYclovir (VALTREX) 1000 MG tablet Take 1 tablet (1,000 mg total) by mouth 3 (three) times daily. (Patient not taking: Reported on 11/14/2016) 21 tablet 0  . valsartan (DIOVAN) 160 MG tablet Take 1 tablet (160 mg total) by mouth daily. (Patient not taking: Reported on 11/14/2016) 90 tablet 3   No current facility-administered medications on file prior to visit.     PAST MEDICAL HISTORY: Past Medical History:  Diagnosis Date  . Arthritis   . Carotid artery stenosis    Mild  . Colon polyps   . Diabetes mellitus   . Diverticulitis   . Diverticulosis   . GERD (gastroesophageal reflux disease)   . Grave's disease   . Hyperlipidemia   . Hypertension   . IBS (irritable bowel syndrome)   . Osteopenia   . Thalassemia minor     PAST SURGICAL HISTORY: Past Surgical History:  Procedure Laterality Date  . CHOLECYSTECTOMY    . HIATAL HERNIA  REPAIR    . HYSTEROSCOPY     fibroids  . laparoscopy     fibroids    SOCIAL HISTORY: Social History  Substance Use Topics  . Smoking status: Never Smoker  . Smokeless tobacco: Never Used  . Alcohol use No    FAMILY HISTORY: Family History  Problem Relation Age of Onset  . Adopted: Yes  . Healthy Son        x 2  . Headache Unknown        Cluster headaches  . Colon cancer Neg Hx   . Pancreatic cancer Neg Hx   . Rectal cancer Neg Hx   . Stomach cancer Neg Hx     ROS: Review of Systems  Constitutional: Positive for malaise/fatigue.  Respiratory: Positive for shortness of breath (on exertion).   Cardiovascular: Negative for chest pain, palpitations and orthopnea.  Gastrointestinal: Negative for nausea and vomiting.  Musculoskeletal: Positive for back pain.       Negative muscle weakness  Neurological: Positive for weakness. Negative for headaches.  Endo/Heme/Allergies:       Negative Heat/Cold Intolerance Negative Lightheadedness    PHYSICAL EXAM: Blood pressure 125/69, pulse 72, temperature 97.9 F (36.6 C), temperature source Oral, height 5' 5"  (1.651 m), weight 198 lb (89.8 kg), SpO2 96 %. Body mass index is 32.95 kg/m. Physical Exam  Constitutional: She is oriented to person, place, and time. She appears well-developed and well-nourished.  Cardiovascular:  Murmur (grade 2/6 holosystolic murmur) heard. Pulmonary/Chest: Effort normal.  Musculoskeletal: Normal range of motion.  Lymphadenopathy:    She has cervical adenopathy (mild left anterior chain lymphadenopathy).  Neurological: She is oriented to person, place, and time.  Skin: Skin is warm and dry.  Psychiatric: She has a normal mood and affect. Her behavior is normal.  Vitals reviewed.   RECENT LABS AND TESTS: BMET    Component Value Date/Time   NA 139 07/19/2016 1453   K 4.0 07/19/2016 1453   CL 103 07/19/2016 1453   CO2 29 07/19/2016 1453   GLUCOSE 148 (H) 07/19/2016 1453   GLUCOSE 135  09/08/2008   BUN 15 07/19/2016 1453   CREATININE 0.76 07/19/2016 1453   CALCIUM 9.8 07/19/2016 1453   GFRNONAA >90 09/18/2011 2055   GFRAA >90 09/18/2011 2055   Lab Results  Component Value Date   HGBA1C 7.0 09/27/2016   No results found for: INSULIN CBC    Component Value Date/Time   WBC 8.2 07/19/2016 1453   RBC 5.77 (H) 07/19/2016 1453   HGB 11.5 (L) 07/19/2016 1453   HCT 36.5 07/19/2016 1453   PLT 280.0 07/19/2016 1453   PLT 314 09/08/2008   MCV 63.1 Repeated and verified X2. (L) 07/19/2016 1453   MCH 19.8 (L) 02/17/2014 1705   MCHC 31.6 07/19/2016 1453   RDW 15.6 (H) 07/19/2016 1453   LYMPHSABS 1.9 07/19/2016 1453   MONOABS 0.7 07/19/2016 1453   EOSABS 0.2 07/19/2016 1453   BASOSABS 0.1 07/19/2016 1453   Iron/TIBC/Ferritin/ %Sat    Component Value Date/Time   IRON 137 05/29/2016 1116   FERRITIN 106.1 05/29/2016 1116   IRONPCTSAT 38.4 05/29/2016 1116   Lipid Panel     Component Value Date/Time   CHOL 159 04/13/2016 0804   TRIG 104.0 04/13/2016 0804   HDL 42.90 04/13/2016 0804   CHOLHDL 4 04/13/2016 0804   VLDL 20.8 04/13/2016 0804   LDLCALC 96 04/13/2016 0804   LDLDIRECT 98.0 05/24/2014 1343   Hepatic Function Panel     Component Value Date/Time   PROT 6.9 04/13/2016 0804   ALBUMIN 4.0 04/13/2016 0804   AST 67 (H) 04/13/2016 0804   ALT 83 (H) 04/13/2016 0804   ALKPHOS 68 04/13/2016 0804   BILITOT 0.9 04/13/2016 0804   BILIDIR 0.2 04/13/2016 0804   IBILI 0.5 02/17/2014 1705      Component Value Date/Time   TSH 1.12 07/19/2016 1453   TSH 1.89 10/07/2015 1330   TSH 1.54 06/09/2015 1201    ECG  shows NSR with a rate of 69 BPM INDIRECT CALORIMETER done today shows a VO2 of 290 and a REE of 2016. Her calculated basal metabolic rate is 2703 thus her basal metabolic rate is better than expected.    ASSESSMENT AND PLAN: Other fatigue - Plan: EKG 12-Lead, CBC with Differential/Platelet, Lipid Panel With LDL/HDL Ratio  Shortness of breath  Type 2  diabetes mellitus without complication, with long-term current use of insulin ([place]) - Plan: Comprehensive metabolic panel, Hemoglobin A1c, Insulin, random, Microalbumin / creatinine urine ratio  Vitamin D deficiency - Plan: VITAMIN D 25 Hydroxy (Vit-D Deficiency, Fractures)  Other specified hypothyroidism - Plan: T3, T4, free, TSH  Essential hypertension  Depression screening  Class 1 obesity with serious comorbidity and body mass index (BMI) of 33.0 to 33.9 in adult, unspecified obesity type  PLAN:  Fatigue Rada was informed that her fatigue may be related to obesity, depression or many other causes. Labs will be ordered, and in the meanwhile Breanna Webster has agreed to work on diet, exercise and weight loss to help with fatigue. Proper sleep hygiene was discussed including the need for 7-8 hours of quality sleep each night. A sleep study was not ordered based on symptoms and Epworth score.  Dyspnea on exertion Breanna Webster's shortness of breath appears to be obesity related and exercise induced. She has agreed to work on weight loss and gradually increase exercise to treat her exercise induced shortness of breath. If Breanna Webster follows our instructions and loses weight without improvement of her shortness of breath, we will plan to refer to pulmonology. We will monitor this condition regularly. Breanna Webster agrees to this plan.  Vitamin D Deficiency Breanna Webster was informed that low vitamin D levels contributes to fatigue and are associated with obesity, breast, and colon cancer. She agrees to continue to take OTC Vit D  And we will check labs and will follow up for routine testing of vitamin D, at least 2-3 times per year. She was informed of the risk of over-replacement of vitamin D and agrees to not increase her dose unless he discusses this with [name] first. Breanna Webster agrees to follow up with our clinic in 2 weeks.  Diabetes II Breanna Webster has been given extensive diabetes education by myself today including ideal fasting and  post-prandial blood glucose readings, individual ideal Hgb A1c goals  and hypoglycemia prevention. We discussed the importance of good blood sugar control to decrease the likelihood of diabetic complications such as nephropathy, neuropathy, limb loss, blindness, coronary artery disease, and death. We discussed the importance of intensive lifestyle modification including diet, exercise and weight loss as the first line treatment for diabetes. Breanna Webster agrees to continue her diabetes medications and with diet and exercise. Her goal is to control her diabetes with less medications. We will check labs and she will follow up at the agreed upon time.  Hypothyroid Breanna Webster was informed of the importance of good thyroid control to help with weight loss efforts. She was also informed that supertheraputic thyroid levels are dangerous and will not improve weight loss results. We will check labs and follow.  Hypertension We discussed sodium restriction, working on healthy weight loss, and a regular exercise program as the means to achieve improved blood pressure control. Breanna Webster agreed with this plan and agreed to follow up as directed. We will continue to monitor her blood pressure as well as her progress with the above lifestyle modifications. We will check labs and she will continue her medications as prescribed and will watch for signs of hypotension as she continues her lifestyle modifications.  Depression Screen Breanna Webster had a mildly positive depression screening. Depression is commonly associated with obesity and often results in emotional eating behaviors. We will monitor this closely and work on CBT to help improve the non-hunger eating patterns. Referral to Psychology may be required if no improvement is seen as she continues in our clinic.  Obesity Breanna Webster is currently in the action stage of change and her goal is to continue with weight loss efforts She has agreed to follow the Category 3 plan Breanna Webster has been instructed  to work up to a goal of 150 minutes of combined cardio and strengthening exercise per week for weight loss and overall health benefits. We discussed the following Behavioral Modification Strategies today: meal planning & cooking strategies, increasing lean protein intake, decreasing simple carbohydrates  and decrease eating out  Evadne has agreed to follow up with our clinic in 2 weeks. She was informed of the importance of frequent follow up visits to maximize her success with intensive lifestyle modifications for her multiple health conditions. She was informed we would discuss her lab results at her next visit unless there is a critical issue that needs to be addressed sooner. Dnasia agreed to keep her next visit at the agreed upon time to discuss these results.  I, Doreene Nest, am acting as transcriptionist for Dennard Nip, MD  I have reviewed the above documentation for accuracy and completeness, and I agree with the above. -Dennard Nip, MD   OBESITY BEHAVIORAL INTERVENTION VISIT  Today's visit was # 1 out of 49.  Starting weight: 198 lbs Starting date: 11/21/16 Today's weight : 198 lbs Today's date: 11/21/2016 Total lbs lost to date: 0 (Patients must lose 7 lbs in the first 6 months to continue with counseling)   ASK: We discussed the diagnosis of obesity with [name] today and Nocole agreed to give [name] permission to discuss obesity behavioral modification therapy today.  ASSESS: Fiana has the diagnosis of obesity and her BMI today is 72 Marvelous is in the action stage of change   ADVISE: Kathyjo was educated on the multiple health risks of obesity as well as the benefit of weight loss to improve her health. She was advised of the need for long term treatment and the importance of lifestyle modifications.  AGREE: Multiple dietary modification options and treatment options were discussed and  Emmanuela agreed to follow the Category 3 plan We discussed the following Behavioral  Modification Strategies today: meal planning & cooking stgrategies, increasing lean protein intake, decreasing simple carbohydrates  and decrease eating out

## 2016-11-22 LAB — COMPREHENSIVE METABOLIC PANEL
ALT: 80 IU/L — ABNORMAL HIGH (ref 0–32)
AST: 72 IU/L — ABNORMAL HIGH (ref 0–40)
Albumin/Globulin Ratio: 1.1 — ABNORMAL LOW (ref 1.2–2.2)
Albumin: 3.9 g/dL (ref 3.5–4.8)
Alkaline Phosphatase: 82 IU/L (ref 39–117)
BUN/Creatinine Ratio: 27 (ref 12–28)
BUN: 17 mg/dL (ref 8–27)
Bilirubin Total: 0.9 mg/dL (ref 0.0–1.2)
CO2: 23 mmol/L (ref 20–29)
Calcium: 10 mg/dL (ref 8.7–10.3)
Chloride: 101 mmol/L (ref 96–106)
Creatinine, Ser: 0.62 mg/dL (ref 0.57–1.00)
GFR calc Af Amer: 103 mL/min/{1.73_m2} (ref >59)
GFR calc non Af Amer: 90 mL/min/{1.73_m2} (ref >59)
Globulin, Total: 3.4 g/dL (ref 1.5–4.5)
Glucose: 152 mg/dL — ABNORMAL HIGH (ref 65–99)
Potassium: 4.3 mmol/L (ref 3.5–5.2)
Sodium: 139 mmol/L (ref 134–144)
Total Protein: 7.3 g/dL (ref 6.0–8.5)

## 2016-11-22 LAB — CBC WITH DIFFERENTIAL/PLATELET
Basophils Absolute: 0.1 10*3/uL (ref 0.0–0.2)
Basos: 1 %
EOS (ABSOLUTE): 0.2 10*3/uL (ref 0.0–0.4)
Eos: 3 %
Hematocrit: 34.5 % (ref 34.0–46.6)
Hemoglobin: 11.5 g/dL (ref 11.1–15.9)
Immature Grans (Abs): 0 10*3/uL (ref 0.0–0.1)
Immature Granulocytes: 0 %
Lymphocytes Absolute: 1.9 10*3/uL (ref 0.7–3.1)
Lymphs: 25 %
MCH: 20.4 pg — ABNORMAL LOW (ref 26.6–33.0)
MCHC: 33.3 g/dL (ref 31.5–35.7)
MCV: 61 fL — ABNORMAL LOW (ref 79–97)
Monocytes Absolute: 0.4 10*3/uL (ref 0.1–0.9)
Monocytes: 6 %
Neutrophils Absolute: 4.9 10*3/uL (ref 1.4–7.0)
Neutrophils: 65 %
Platelets: 270 10*3/uL (ref 150–379)
RBC: 5.64 x10E6/uL — ABNORMAL HIGH (ref 3.77–5.28)
RDW: 15.2 % (ref 12.3–15.4)
WBC: 7.5 10*3/uL (ref 3.4–10.8)

## 2016-11-22 LAB — VITAMIN D 25 HYDROXY (VIT D DEFICIENCY, FRACTURES): Vit D, 25-Hydroxy: 28 ng/mL — ABNORMAL LOW (ref 30.0–100.0)

## 2016-11-22 LAB — T3: T3, Total: 114 ng/dL (ref 71–180)

## 2016-11-22 LAB — LIPID PANEL WITH LDL/HDL RATIO
Cholesterol, Total: 156 mg/dL (ref 100–199)
HDL: 44 mg/dL (ref >39)
LDL Calculated: 91 mg/dL (ref 0–99)
LDl/HDL Ratio: 2.1 ratio (ref 0.0–3.2)
Triglycerides: 104 mg/dL (ref 0–149)
VLDL Cholesterol Cal: 21 mg/dL (ref 5–40)

## 2016-11-22 LAB — MICROALBUMIN / CREATININE URINE RATIO
Creatinine, Urine: 86.2 mg/dL
Microalb/Creat Ratio: 4.5 mg/g creat (ref 0.0–30.0)
Microalbumin, Urine: 3.9 ug/mL

## 2016-11-22 LAB — T4, FREE: Free T4: 1.04 ng/dL (ref 0.82–1.77)

## 2016-11-22 LAB — HEMOGLOBIN A1C
Est. average glucose Bld gHb Est-mCnc: 177 mg/dL
Hgb A1c MFr Bld: 7.8 % — ABNORMAL HIGH (ref 4.8–5.6)

## 2016-11-22 LAB — INSULIN, RANDOM: INSULIN: 76.3 u[IU]/mL — ABNORMAL HIGH (ref 2.6–24.9)

## 2016-11-22 LAB — TSH: TSH: 2.56 u[IU]/mL (ref 0.450–4.500)

## 2016-11-27 ENCOUNTER — Other Ambulatory Visit: Payer: Self-pay | Admitting: Adult Health

## 2016-11-29 ENCOUNTER — Other Ambulatory Visit: Payer: Self-pay | Admitting: Adult Health

## 2016-11-29 MED ORDER — BISOPROLOL FUMARATE 5 MG PO TABS: 5.0000 mg | ORAL_TABLET | Freq: Every day | ORAL | 3 refills | Status: DC

## 2016-12-05 ENCOUNTER — Encounter (INDEPENDENT_AMBULATORY_CARE_PROVIDER_SITE_OTHER): Payer: Self-pay | Admitting: Dietician

## 2016-12-05 ENCOUNTER — Ambulatory Visit (INDEPENDENT_AMBULATORY_CARE_PROVIDER_SITE_OTHER): Payer: Medicare Other | Admitting: Family Medicine

## 2016-12-05 VITALS — BP 113/56 | HR 64 | Temp 98.7°F | Ht 65.0 in | Wt 195.0 lb

## 2016-12-05 DIAGNOSIS — Z6832 Body mass index (BMI) 32.0-32.9, adult: Secondary | ICD-10-CM

## 2016-12-05 DIAGNOSIS — R7989 Other specified abnormal findings of blood chemistry: Secondary | ICD-10-CM

## 2016-12-05 DIAGNOSIS — E559 Vitamin D deficiency, unspecified: Secondary | ICD-10-CM | POA: Diagnosis not present

## 2016-12-05 DIAGNOSIS — E66811 Obesity, class 1: Secondary | ICD-10-CM

## 2016-12-05 DIAGNOSIS — E11649 Type 2 diabetes mellitus with hypoglycemia without coma: Secondary | ICD-10-CM | POA: Diagnosis not present

## 2016-12-05 DIAGNOSIS — E669 Obesity, unspecified: Secondary | ICD-10-CM | POA: Diagnosis not present

## 2016-12-05 DIAGNOSIS — Z794 Long term (current) use of insulin: Secondary | ICD-10-CM

## 2016-12-05 DIAGNOSIS — R945 Abnormal results of liver function studies: Secondary | ICD-10-CM

## 2016-12-05 MED ORDER — VITAMIN D (ERGOCALCIFEROL) 1.25 MG (50000 UNIT) PO CAPS: 50000.0000 [IU] | ORAL_CAPSULE | ORAL | 0 refills | Status: DC

## 2016-12-05 NOTE — Progress Notes (Signed)
Office: [phone]  /  Fax: [phone]   HPI:   Chief Complaint: OBESITY Breanna Webster is here to discuss her progress with her obesity treatment plan. She is on the Category 3 plan and is following her eating plan approximately 90 % of the time. She states she is exercising 0 minutes 0 times per week. Breanna Webster is eating kosher foods only. She is disappointed at her weight and has gotten bored with "plain" food. She is not following plan very closely and is eating out and adding calories. She is followed her plan maybe 50%.  Her weight is 195 lb (88.5 kg) today and has had a weight loss of 3 pounds over a period of 2 weeks since her last visit. She has lost 3 lbs since starting treatment with [name].  Vitamin D deficiency Breanna Webster has a diagnosis of vitamin D deficiency. She is currently taking OTC vit D and is not yet at goal. She admits fatigue and denies nausea, vomiting or muscle weakness.  Elevated LFT Breanna Webster has a new dx of elevated ALT. She states this is due to her nortriptyline and denies abdominal pain or jaundice and has never been told of any liver problems in the past. She denies excessive alcohol intake.  Diabetes II Breanna Webster has a diagnosis of diabetes type II. Last A1c was at 7.8, increased from 7.0. She is on NPH insulin due to it being the cheapest insulin and sometimes feels hypoglycemic. Breanna Webster didn't bring in her blood glucose log as requested. She has been working on intensive lifestyle modifications including diet, exercise, and weight loss to help control her blood glucose levels.    ALLERGIES: Allergies  Allergen Reactions   Statins Other (See Comments)    Muscle aches    MEDICATIONS: Current Outpatient Prescriptions on File Prior to Visit  Medication Sig Dispense Refill   aspirin 81 MG tablet Take 81 mg by mouth daily.       bisoprolol (ZEBETA) 5 MG tablet Take 1 tablet (5 mg total) by mouth daily. 90 tablet 3   escitalopram (LEXAPRO) 20 MG tablet take 1 tablet by mouth once  daily 90 tablet 1   esomeprazole (NEXIUM) 40 MG capsule Take 1 capsule (40 mg total) by mouth 2 (two) times daily before a meal. 60 capsule 2   glucose blood (ONETOUCH VERIO) test strip Use to check blood sugar 2 times per day. Dx Code: E11.8 200 each 2   hydrochlorothiazide (HYDRODIURIL) 12.5 MG tablet Take 1 tablet (12.5 mg total) by mouth daily. 90 tablet 3   insulin NPH Human (NOVOLIN N RELION) 100 UNIT/ML injection Inject 1.3 mLs (130 Units total) into the skin every morning. 50 mL 2   Insulin Syringe-Needle U-100 (INSULIN SYRINGE .5CC/30GX1/2") 30G X 1/2" 0.5 ML MISC Use to inject insulin 2 times per day. 100 each 2   losartan (COZAAR) 50 MG tablet Take 1 tablet (50 mg total) by mouth daily. 30 tablet 1   Multiple Vitamin (MULITIVITAMIN WITH MINERALS) TABS Take 1 tablet by mouth daily.     nortriptyline (PAMELOR) 25 MG capsule Take 1 capsule (25 mg total) by mouth at bedtime. 90 capsule 3   ONETOUCH DELICA LANCETS 02I MISC Use to check blood sugar 2 times per day. Dx Code: E11.8 200 each 1   SYNTHROID 150 MCG tablet take 1 tablet by mouth once daily 90 tablet 3   valACYclovir (VALTREX) 1000 MG tablet Take 1 tablet (1,000 mg total) by mouth 3 (three) times daily. 21 tablet 0  valsartan (DIOVAN) 160 MG tablet Take 1 tablet (160 mg total) by mouth daily. 90 tablet 3   No current facility-administered medications on file prior to visit.     PAST MEDICAL HISTORY: Past Medical History:  Diagnosis Date   Arthritis    Carotid artery stenosis    Mild   Colon polyps    Diabetes mellitus    Diverticulitis    Diverticulosis    GERD (gastroesophageal reflux disease)    Grave's disease    Hyperlipidemia    Hypertension    IBS (irritable bowel syndrome)    Osteopenia    Thalassemia minor     PAST SURGICAL HISTORY: Past Surgical History:  Procedure Laterality Date   CHOLECYSTECTOMY     HIATAL HERNIA REPAIR     HYSTEROSCOPY     fibroids   laparoscopy      fibroids    SOCIAL HISTORY: Social History  Substance Use Topics   Smoking status: Never Smoker   Smokeless tobacco: Never Used   Alcohol use No    FAMILY HISTORY: Family History  Problem Relation Age of Onset   Adopted: Yes   Healthy Son        x 2   Headache Unknown        Cluster headaches   Colon cancer Neg Hx    Pancreatic cancer Neg Hx    Rectal cancer Neg Hx    Stomach cancer Neg Hx     ROS: Review of Systems  Constitutional: Positive for malaise/fatigue and weight loss.  Gastrointestinal: Negative for abdominal pain, nausea and vomiting.       Negative Jaundice  Musculoskeletal:       Negative muscle weakness  Endo/Heme/Allergies:       Hypoglycemia    PHYSICAL EXAM: Blood pressure (!) 113/56, pulse 64, temperature 98.7 F (37.1 C), temperature source Oral, height 5' 5"  (1.651 m), weight 195 lb (88.5 kg), SpO2 95 %. Body mass index is 32.45 kg/m. Physical Exam  Constitutional: She is oriented to person, place, and time. She appears well-developed and well-nourished.  Cardiovascular: Normal rate.   Pulmonary/Chest: Effort normal.  Musculoskeletal: Normal range of motion.  Neurological: She is oriented to person, place, and time.  Skin: Skin is warm and dry.  Psychiatric: She has a normal mood and affect. Her behavior is normal.  Vitals reviewed.   RECENT LABS AND TESTS: BMET    Component Value Date/Time   NA 139 11/21/2016 1113   K 4.3 11/21/2016 1113   CL 101 11/21/2016 1113   CO2 23 11/21/2016 1113   GLUCOSE 152 (H) 11/21/2016 1113   GLUCOSE 148 (H) 07/19/2016 1453   GLUCOSE 135 09/08/2008   BUN 17 11/21/2016 1113   CREATININE 0.62 11/21/2016 1113   CALCIUM 10.0 11/21/2016 1113   GFRNONAA 90 11/21/2016 1113   GFRAA 103 11/21/2016 1113   Lab Results  Component Value Date   HGBA1C 7.8 (H) 11/21/2016   HGBA1C 7.0 09/27/2016   HGBA1C 7.5 07/05/2016   HGBA1C 9.5 05/08/2016   HGBA1C 9.2 02/08/2016   Lab Results  Component  Value Date   INSULIN 76.3 (H) 11/21/2016   CBC    Component Value Date/Time   WBC 7.5 11/21/2016 1113   WBC 8.2 07/19/2016 1453   RBC 5.64 (H) 11/21/2016 1113   RBC 5.77 (H) 07/19/2016 1453   HGB 11.5 11/21/2016 1113   HCT 34.5 11/21/2016 1113   PLT 270 11/21/2016 1113   MCV 61 (L) 11/21/2016 1113  MCH 20.4 (L) 11/21/2016 1113   MCH 19.8 (L) 02/17/2014 1705   MCHC 33.3 11/21/2016 1113   MCHC 31.6 07/19/2016 1453   RDW 15.2 11/21/2016 1113   LYMPHSABS 1.9 11/21/2016 1113   MONOABS 0.7 07/19/2016 1453   EOSABS 0.2 11/21/2016 1113   BASOSABS 0.1 11/21/2016 1113   Iron/TIBC/Ferritin/ %Sat    Component Value Date/Time   IRON 137 05/29/2016 1116   FERRITIN 106.1 05/29/2016 1116   IRONPCTSAT 38.4 05/29/2016 1116   Lipid Panel     Component Value Date/Time   CHOL 156 11/21/2016 1113   TRIG 104 11/21/2016 1113   HDL 44 11/21/2016 1113   CHOLHDL 4 04/13/2016 0804   VLDL 20.8 04/13/2016 0804   LDLCALC 91 11/21/2016 1113   LDLDIRECT 98.0 05/24/2014 1343   Hepatic Function Panel     Component Value Date/Time   PROT 7.3 11/21/2016 1113   ALBUMIN 3.9 11/21/2016 1113   AST 72 (H) 11/21/2016 1113   ALT 80 (H) 11/21/2016 1113   ALKPHOS 82 11/21/2016 1113   BILITOT 0.9 11/21/2016 1113   BILIDIR 0.2 04/13/2016 0804   IBILI 0.5 02/17/2014 1705      Component Value Date/Time   TSH 2.560 11/21/2016 1113   TSH 1.12 07/19/2016 1453   TSH 1.89 10/07/2015 1330    ASSESSMENT AND PLAN: Vitamin D deficiency - Plan: Vitamin D, Ergocalciferol, (DRISDOL) 50000 units CAPS capsule  Elevated liver function tests  Type 2 diabetes mellitus with hypoglycemia without coma, with long-term current use of insulin (HCC)  Class 1 obesity with serious comorbidity and body mass index (BMI) of 32.0 to 32.9 in adult, unspecified obesity type  PLAN:  Vitamin D Deficiency Breanna Webster was informed that low vitamin D levels contributes to fatigue and are associated with obesity, breast, and colon  cancer. She agrees to start to take prescription Vit D @50 ,000 IU every week #4 with no refills and will follow up for routine testing of vitamin D, at least 2-3 times per year. She was informed of the risk of over-replacement of vitamin D and agrees to not increase her dose unless he discusses this with [name] first. Breanna Webster agrees to follow up with our clinic in 2 weeks.  Elevated LFT We discussed the likely diagnosis of non alcoholic fatty liver disease today and how this condition is obesity related. Breanna Webster was educated on her risk of developing NASH or even liver failure and the only proven treatment for NAFLD was weight loss. Breanna Webster agreed to continue with her weight loss efforts with healthier diet and exercise as an essential part of her treatment plan. If elevated LFT is due to NAFLD this will improve with weight loss. If not, Breanna Webster was advised to follow up with her PCP.  Diabetes II Breanna Webster has been given extensive diabetes education by myself today including ideal fasting and post-prandial blood glucose readings, individual ideal Hgb A1c goals and hypoglycemia prevention. We discussed the importance of good blood sugar control to decrease the likelihood of diabetic complications such as nephropathy, neuropathy, limb loss, blindness, coronary artery disease, and death. We discussed the importance of intensive lifestyle modification including diet, exercise and weight loss as the first line treatment for diabetes. She agrees to check her blood sugar bid and bring in her log on her next visit. She was warned about hypoglycemia. Joydan agrees to continue her diabetes medications and will follow up at the agreed upon time.  Obesity Breanna Webster is currently in the action stage of change. As such,  her goal is to continue with weight loss efforts She has agreed to follow the Category 3 plan Breanna Webster has been instructed to work up to a goal of 150 minutes of combined cardio and strengthening exercise per week for weight loss and  overall health benefits. We discussed the following Behavioral Modification Strategies today: meal planning & cooking strategies, increasing lean protein intake, decreasing simple carbohydrates  and decrease eating out  Breanna Webster has agreed to follow up with our clinic in 2 weeks. She was informed of the importance of frequent follow up visits to maximize her success with intensive lifestyle modifications for her multiple health conditions.  I, Breanna Webster, am acting as transcriptionist for Dennard Nip, MD  I have reviewed the above documentation for accuracy and completeness, and I agree with the above. -Dennard Nip, MD    OBESITY BEHAVIORAL INTERVENTION VISIT  Today's visit was # 2 out of 73.  Starting weight: 198 lbs Starting date: 11/21/16 Today's weight : 195 lbs Today's date: 12/05/2016 Total lbs lost to date: 3 (Patients must lose 7 lbs in the first 6 months to continue with counseling)   ASK: We discussed the diagnosis of obesity with Breanna Webster today and Breanna Webster agreed to give [name] permission to discuss obesity behavioral modification therapy today.  ASSESS: Breanna Webster has the diagnosis of obesity and her BMI today is 32.5 Breanna Webster is in the action stage of change   ADVISE: Breanna Webster was educated on the multiple health risks of obesity as well as the benefit of weight loss to improve her health. She was advised of the need for long term treatment and the importance of lifestyle modifications.  AGREE: Multiple dietary modification options and treatment options were discussed and  Mariaceleste agreed to follow the Category 3 plan We discussed the following Behavioral Modification Strategies today: meal planning & cooking strategies, increasing lean protein intake, decreasing simple carbohydrates and decrease eating out

## 2016-12-13 DIAGNOSIS — M47817 Spondylosis without myelopathy or radiculopathy, lumbosacral region: Secondary | ICD-10-CM | POA: Diagnosis not present

## 2016-12-13 DIAGNOSIS — M545 Low back pain: Secondary | ICD-10-CM | POA: Diagnosis not present

## 2016-12-20 ENCOUNTER — Ambulatory Visit (INDEPENDENT_AMBULATORY_CARE_PROVIDER_SITE_OTHER): Payer: Medicare Other | Admitting: Family Medicine

## 2016-12-20 VITALS — BP 101/59 | HR 66 | Temp 98.4°F | Ht 65.0 in | Wt 198.0 lb

## 2016-12-20 DIAGNOSIS — E559 Vitamin D deficiency, unspecified: Secondary | ICD-10-CM

## 2016-12-20 DIAGNOSIS — Z794 Long term (current) use of insulin: Secondary | ICD-10-CM | POA: Diagnosis not present

## 2016-12-20 DIAGNOSIS — E119 Type 2 diabetes mellitus without complications: Secondary | ICD-10-CM

## 2016-12-20 DIAGNOSIS — E669 Obesity, unspecified: Secondary | ICD-10-CM

## 2016-12-20 DIAGNOSIS — Z6833 Body mass index (BMI) 33.0-33.9, adult: Secondary | ICD-10-CM | POA: Diagnosis not present

## 2016-12-20 MED ORDER — VITAMIN D (ERGOCALCIFEROL) 1.25 MG (50000 UNIT) PO CAPS: 50000.0000 [IU] | ORAL_CAPSULE | ORAL | 0 refills | Status: DC

## 2016-12-20 NOTE — Progress Notes (Signed)
Office: [phone]  /  Fax: [phone]   HPI:   Chief Complaint: OBESITY Breanna Webster is here to discuss her progress with her obesity treatment plan. She is on the  follow the Category 3 plan and is following her eating plan approximately 85 % of the time. She states she is exercising 0 minutes 0 times per week. Breanna Webster is retaining fluid. She states she is not drinking as much water. She has not been eating her protein on the plan and states hunger is not well controlled. Her weight is 198 lb (89.8 kg) today and has had a weight gain of 3 pounds over a period of 2 weeks since her last visit. She has lost 0 lbs since starting treatment with [name].  Vitamin D deficiency Breanna Webster has a diagnosis of vitamin D deficiency. She is currently taking vit D and denies nausea, vomiting or muscle weakness.  Diabetes II Breanna Webster has a diagnosis of diabetes type II. Breanna Webster states fasting BGs range between 60's and 150's and admits to a hypoglycemic episode once in the 40's..  She has been working on intensive lifestyle modifications including diet, exercise, and weight loss to help control her blood glucose levels.  ALLERGIES: Allergies  Allergen Reactions  . Statins Other (See Comments)    Muscle aches    MEDICATIONS: Current Outpatient Prescriptions on File Prior to Visit  Medication Sig Dispense Refill  . aspirin 81 MG tablet Take 81 mg by mouth daily.      . bisoprolol (ZEBETA) 5 MG tablet Take 1 tablet (5 mg total) by mouth daily. 90 tablet 3  . escitalopram (LEXAPRO) 20 MG tablet take 1 tablet by mouth once daily 90 tablet 1  . glucose blood (ONETOUCH VERIO) test strip Use to check blood sugar 2 times per day. Dx Code: E11.8 200 each 2  . hydrochlorothiazide (HYDRODIURIL) 12.5 MG tablet Take 1 tablet (12.5 mg total) by mouth daily. 90 tablet 3  . insulin NPH Human (NOVOLIN N RELION) 100 UNIT/ML injection Inject 1.3 mLs (130 Units total) into the skin every morning. (Patient taking differently: Inject 120  Units into the skin every morning. ) 50 mL 2  . Insulin Syringe-Needle U-100 (INSULIN SYRINGE .5CC/30GX1/2") 30G X 1/2" 0.5 ML MISC Use to inject insulin 2 times per day. 100 each 2  . losartan (COZAAR) 50 MG tablet Take 1 tablet (50 mg total) by mouth daily. 30 tablet 1  . Multiple Vitamin (MULITIVITAMIN WITH MINERALS) TABS Take 1 tablet by mouth daily.    . nortriptyline (PAMELOR) 25 MG capsule Take 1 capsule (25 mg total) by mouth at bedtime. 90 capsule 3  . ONETOUCH DELICA LANCETS 37V MISC Use to check blood sugar 2 times per day. Dx Code: E11.8 200 each 1  . SYNTHROID 150 MCG tablet take 1 tablet by mouth once daily 90 tablet 3  . valsartan (DIOVAN) 160 MG tablet Take 1 tablet (160 mg total) by mouth daily. 90 tablet 3  . Vitamin D, Ergocalciferol, (DRISDOL) 50000 units CAPS capsule Take 1 capsule (50,000 Units total) by mouth every 7 (seven) days. 4 capsule 0   No current facility-administered medications on file prior to visit.     PAST MEDICAL HISTORY: Past Medical History:  Diagnosis Date  . Arthritis   . Carotid artery stenosis    Mild  . Colon polyps   . Diabetes mellitus   . Diverticulitis   . Diverticulosis   . GERD (gastroesophageal reflux disease)   . Grave's disease   .  Hyperlipidemia   . Hypertension   . IBS (irritable bowel syndrome)   . Osteopenia   . Thalassemia minor     PAST SURGICAL HISTORY: Past Surgical History:  Procedure Laterality Date  . CHOLECYSTECTOMY    . HIATAL HERNIA REPAIR    . HYSTEROSCOPY     fibroids  . laparoscopy     fibroids    SOCIAL HISTORY: Social History  Substance Use Topics  . Smoking status: Never Smoker  . Smokeless tobacco: Never Used  . Alcohol use No    FAMILY HISTORY: Family History  Problem Relation Age of Onset  . Adopted: Yes  . Healthy Son        x 2  . Headache Unknown        Cluster headaches  . Colon cancer Neg Hx   . Pancreatic cancer Neg Hx   . Rectal cancer Neg Hx   . Stomach cancer Neg Hx      ROS: Review of Systems  Constitutional: Negative for weight loss.  Gastrointestinal: Negative for nausea and vomiting.  Musculoskeletal:       Negative muscle weakness  Endo/Heme/Allergies:       Hypoglycemia    PHYSICAL EXAM: Blood pressure (!) 101/59, pulse 66, temperature 98.4 F (36.9 C), temperature source Oral, height 5' 5"  (1.651 m), weight 198 lb (89.8 kg), SpO2 96 %. Body mass index is 32.95 kg/m. Physical Exam  Constitutional: She is oriented to person, place, and time. She appears well-developed and well-nourished.  Cardiovascular: Normal rate.   Pulmonary/Chest: Effort normal.  Musculoskeletal: Normal range of motion.  Neurological: She is oriented to person, place, and time.  Skin: Skin is warm and dry.  Psychiatric: She has a normal mood and affect. Her behavior is normal.  Vitals reviewed.   RECENT LABS AND TESTS: BMET    Component Value Date/Time   NA 139 11/21/2016 1113   K 4.3 11/21/2016 1113   CL 101 11/21/2016 1113   CO2 23 11/21/2016 1113   GLUCOSE 152 (H) 11/21/2016 1113   GLUCOSE 148 (H) 07/19/2016 1453   GLUCOSE 135 09/08/2008   BUN 17 11/21/2016 1113   CREATININE 0.62 11/21/2016 1113   CALCIUM 10.0 11/21/2016 1113   GFRNONAA 90 11/21/2016 1113   GFRAA 103 11/21/2016 1113   Lab Results  Component Value Date   HGBA1C 7.8 (H) 11/21/2016   HGBA1C 7.0 09/27/2016   HGBA1C 7.5 07/05/2016   HGBA1C 9.5 05/08/2016   HGBA1C 9.2 02/08/2016   Lab Results  Component Value Date   INSULIN 76.3 (H) 11/21/2016   CBC    Component Value Date/Time   WBC 7.5 11/21/2016 1113   WBC 8.2 07/19/2016 1453   RBC 5.64 (H) 11/21/2016 1113   RBC 5.77 (H) 07/19/2016 1453   HGB 11.5 11/21/2016 1113   HCT 34.5 11/21/2016 1113   PLT 270 11/21/2016 1113   MCV 61 (L) 11/21/2016 1113   MCH 20.4 (L) 11/21/2016 1113   MCH 19.8 (L) 02/17/2014 1705   MCHC 33.3 11/21/2016 1113   MCHC 31.6 07/19/2016 1453   RDW 15.2 11/21/2016 1113   LYMPHSABS 1.9 11/21/2016  1113   MONOABS 0.7 07/19/2016 1453   EOSABS 0.2 11/21/2016 1113   BASOSABS 0.1 11/21/2016 1113   Iron/TIBC/Ferritin/ %Sat    Component Value Date/Time   IRON 137 05/29/2016 1116   FERRITIN 106.1 05/29/2016 1116   IRONPCTSAT 38.4 05/29/2016 1116   Lipid Panel     Component Value Date/Time   CHOL 156  11/21/2016 1113   TRIG 104 11/21/2016 1113   HDL 44 11/21/2016 1113   CHOLHDL 4 04/13/2016 0804   VLDL 20.8 04/13/2016 0804   LDLCALC 91 11/21/2016 1113   LDLDIRECT 98.0 05/24/2014 1343   Hepatic Function Panel     Component Value Date/Time   PROT 7.3 11/21/2016 1113   ALBUMIN 3.9 11/21/2016 1113   AST 72 (H) 11/21/2016 1113   ALT 80 (H) 11/21/2016 1113   ALKPHOS 82 11/21/2016 1113   BILITOT 0.9 11/21/2016 1113   BILIDIR 0.2 04/13/2016 0804   IBILI 0.5 02/17/2014 1705      Component Value Date/Time   TSH 2.560 11/21/2016 1113   TSH 1.12 07/19/2016 1453   TSH 1.89 10/07/2015 1330    ASSESSMENT AND PLAN: Vitamin D deficiency - Plan: Vitamin D, Ergocalciferol, (DRISDOL) 50000 units CAPS capsule  Type 2 diabetes mellitus without complication, with long-term current use of insulin (HCC)  Class 1 obesity with serious comorbidity and body mass index (BMI) of 33.0 to 33.9 in adult, unspecified obesity type  PLAN:  Vitamin D Deficiency Breanna Webster was informed that low vitamin D levels contributes to fatigue and are associated with obesity, breast, and colon cancer. She agrees to continue to take prescription Vit D @50 ,000 IU every week, we will refill for 1 month and will follow up for routine testing of vitamin D, at least 2-3 times per year. She was informed of the risk of over-replacement of vitamin D and agrees to not increase her dose unless he discusses this with [name] first. Breanna Webster agrees to follow up with our clinic in 2 weeks.  Diabetes II Breanna Webster has been given extensive diabetes education by myself today including ideal fasting and post-prandial blood glucose readings, individual  ideal Hgb A1c goals  and hypoglycemia prevention. We discussed the importance of good blood sugar control to decrease the likelihood of diabetic complications such as nephropathy, neuropathy, limb loss, blindness, coronary artery disease, and death. We discussed the importance of intensive lifestyle modification including diet, exercise and weight loss as the first line treatment for diabetes. Breanna Webster agrees to decrease Novolin to 120 units qd and will follow up at the agreed upon time.  Obesity Breanna Webster is currently in the action stage of change. As such, her goal is to continue with weight loss efforts She has agreed to follow the Category 3 plan Breanna Webster has been instructed to work up to a goal of 150 minutes of combined cardio and strengthening exercise per week for weight loss and overall health benefits. We discussed the following Behavioral Modification Strategies today: increasing lean protein intake, increase H2O intake and no skipping meals  Breanna Webster has agreed to follow up with our clinic in 2 weeks. She was informed of the importance of frequent follow up visits to maximize her success with intensive lifestyle modifications for her multiple health conditions.  I, Doreene Nest, am acting as transcriptionist for Lacy Duverney, PA-C  I have reviewed the above documentation for accuracy and completeness, and I agree with the above. -Lacy Duverney, PA-C  I have reviewed the above note and agree with the plan. -Dennard Nip, MD   OBESITY BEHAVIORAL INTERVENTION VISIT  Today's visit was # 3 out of 22.  Starting weight: 198 lbs Starting date: 11/21/16 Today's weight : 198 lbs Today's date: 12/20/2016 Total lbs lost to date: 0 (Patients must lose 7 lbs in the first 6 months to continue with counseling)   ASK: We discussed the diagnosis of obesity with Breanna Webster  Bottari today and Breanna Webster agreed to give [name] permission to discuss obesity behavioral modification therapy today.  ASSESS: Emylia has the diagnosis  of obesity and her BMI today is 32.95 Breanna Webster is in the action stage of change   ADVISE: Breanna Webster was educated on the multiple health risks of obesity as well as the benefit of weight loss to improve her health. She was advised of the need for long term treatment and the importance of lifestyle modifications.  AGREE: Multiple dietary modification options and treatment options were discussed and  Breanna Webster agreed to follow the Category 3 plan We discussed the following Behavioral Modification Strategies today: increasing lean protein intake, increase H2O intake and no skipping meals

## 2016-12-26 ENCOUNTER — Telehealth: Payer: Self-pay | Admitting: Endocrinology

## 2016-12-26 NOTE — Telephone Encounter (Signed)
Also canceled patient's appt for 9/7.

## 2016-12-26 NOTE — Telephone Encounter (Signed)
Called left patient VM that she could delay her appt. To early October & call back to reschedule that.

## 2016-12-26 NOTE — Telephone Encounter (Signed)
Patient calling b/c she just had labs done w/ Dr. Shary Decamp and wants to know if she still needs to come in for her f/u w/ Loanne Drilling on 09/07?  Want's Loanne Drilling to view her labs in [facility].  Thank you,  -LL

## 2016-12-26 NOTE — Telephone Encounter (Signed)
Please delay the visit until early October.  I hope you feel well.

## 2016-12-28 ENCOUNTER — Ambulatory Visit: Payer: PRIVATE HEALTH INSURANCE | Admitting: Endocrinology

## 2017-01-01 ENCOUNTER — Encounter (INDEPENDENT_AMBULATORY_CARE_PROVIDER_SITE_OTHER): Payer: Self-pay | Admitting: Family Medicine

## 2017-01-02 ENCOUNTER — Other Ambulatory Visit: Payer: Self-pay | Admitting: Adult Health

## 2017-01-02 NOTE — Telephone Encounter (Signed)
Last seen for HTN 07/18-per note con't current med and f/u PRN.

## 2017-01-03 ENCOUNTER — Ambulatory Visit (INDEPENDENT_AMBULATORY_CARE_PROVIDER_SITE_OTHER): Payer: Medicare Other | Admitting: Physician Assistant

## 2017-01-03 VITALS — BP 110/61 | HR 58 | Temp 98.1°F | Ht 65.0 in | Wt 195.0 lb

## 2017-01-03 DIAGNOSIS — E119 Type 2 diabetes mellitus without complications: Secondary | ICD-10-CM

## 2017-01-03 DIAGNOSIS — Z6834 Body mass index (BMI) 34.0-34.9, adult: Secondary | ICD-10-CM

## 2017-01-03 DIAGNOSIS — Z794 Long term (current) use of insulin: Secondary | ICD-10-CM

## 2017-01-03 DIAGNOSIS — E669 Obesity, unspecified: Secondary | ICD-10-CM

## 2017-01-03 NOTE — Progress Notes (Signed)
Office: [phone]  /  Fax: [phone]   HPI:   Chief Complaint: OBESITY Breanna Webster is here to discuss her progress with her obesity treatment plan. She is on the follow the Category 3 plan and is following her eating plan approximately 85 % of the time. She states she is exercising 0 minutes 0 times per week. Breanna Webster continues to do well with weight loss. She has more challenge getting her protein in as she only eats kosher meats. Would like more kosher food ideas and other protein options. Her weight is 195 lb (88.5 kg) today and has had a weight loss of 4 pounds over a period of 2 weeks since her last visit. She has lost 3 lbs since starting treatment with [name].  Diabetes II Breanna Webster has a diagnosis of diabetes type II. Breanna Webster states fasting BGs range between 80 and 120 and post prandial range in the 160's approximately and denies any hypoglycemic episodes. She has been working on intensive lifestyle modifications including diet, exercise, and weight loss to help control her blood glucose levels.   ALLERGIES: Allergies  Allergen Reactions  . Statins Other (See Comments)    Muscle aches    MEDICATIONS: Current Outpatient Prescriptions on File Prior to Visit  Medication Sig Dispense Refill  . aspirin 81 MG tablet Take 81 mg by mouth daily.      . bisoprolol (ZEBETA) 5 MG tablet Take 1 tablet (5 mg total) by mouth daily. 90 tablet 3  . escitalopram (LEXAPRO) 20 MG tablet take 1 tablet by mouth once daily 90 tablet 1  . glucose blood (ONETOUCH VERIO) test strip Use to check blood sugar 2 times per day. Dx Code: E11.8 200 each 2  . hydrochlorothiazide (HYDRODIURIL) 12.5 MG tablet Take 1 tablet (12.5 mg total) by mouth daily. 90 tablet 3  . insulin NPH Human (NOVOLIN N RELION) 100 UNIT/ML injection Inject 1.3 mLs (130 Units total) into the skin every morning. (Patient taking differently: Inject 120 Units into the skin every morning. ) 50 mL 2  . Insulin Syringe-Needle U-100 (INSULIN SYRINGE  .5CC/30GX1/2") 30G X 1/2" 0.5 ML MISC Use to inject insulin 2 times per day. 100 each 2  . losartan (COZAAR) 50 MG tablet take 1 tablet by mouth once daily 90 tablet 1  . Multiple Vitamin (MULITIVITAMIN WITH MINERALS) TABS Take 1 tablet by mouth daily.    . nortriptyline (PAMELOR) 25 MG capsule Take 1 capsule (25 mg total) by mouth at bedtime. 90 capsule 3  . omeprazole (PRILOSEC) 20 MG capsule Take 20 mg by mouth daily as needed.    Breanna Webster MISC Use to check blood sugar 2 times per day. Dx Code: E11.8 200 each 1  . SYNTHROID 150 MCG tablet take 1 tablet by mouth once daily 90 tablet 3  . valsartan (DIOVAN) 160 MG tablet Take 1 tablet (160 mg total) by mouth daily. 90 tablet 3  . Vitamin D, Ergocalciferol, (DRISDOL) 50000 units CAPS capsule Take 1 capsule (50,000 Units total) by mouth every 7 (seven) days. 4 capsule 0   No current facility-administered medications on file prior to visit.     PAST MEDICAL HISTORY: Past Medical History:  Diagnosis Date  . Arthritis   . Carotid artery stenosis    Mild  . Colon polyps   . Diabetes mellitus   . Diverticulitis   . Diverticulosis   . GERD (gastroesophageal reflux disease)   . Grave's disease   . Hyperlipidemia   .  Hypertension   . IBS (irritable bowel syndrome)   . Osteopenia   . Thalassemia minor     PAST SURGICAL HISTORY: Past Surgical History:  Procedure Laterality Date  . CHOLECYSTECTOMY    . HIATAL HERNIA REPAIR    . HYSTEROSCOPY     fibroids  . laparoscopy     fibroids    SOCIAL HISTORY: Social History  Substance Use Topics  . Smoking status: Never Smoker  . Smokeless tobacco: Never Used  . Alcohol use No    FAMILY HISTORY: Family History  Problem Relation Age of Onset  . Adopted: Yes  . Healthy Son        x 2  . Headache Unknown        Cluster headaches  . Colon cancer Neg Hx   . Pancreatic cancer Neg Hx   . Rectal cancer Neg Hx   . Stomach cancer Neg Hx     ROS: Review of  Systems  Constitutional: Positive for weight loss.  Endo/Heme/Allergies:       Negative hypoglycemia    PHYSICAL EXAM: Blood pressure 110/61, pulse (!) 58, temperature 98.1 F (36.7 C), temperature source Oral, height 5' 5"  (1.651 m), weight 195 lb (88.5 kg), SpO2 96 %. Body mass index is 32.45 kg/m. Physical Exam  Constitutional: She is oriented to person, place, and time. She appears well-developed and well-nourished.  Cardiovascular:  Bradycardic  Pulmonary/Chest: Effort normal.  Musculoskeletal: Normal range of motion.  Neurological: She is oriented to person, place, and time.  Skin: Skin is warm and dry.  Psychiatric: She has a normal mood and affect. Her behavior is normal.  Vitals reviewed.   RECENT LABS AND TESTS: BMET    Component Value Date/Time   NA 139 11/21/2016 1113   K 4.3 11/21/2016 1113   CL 101 11/21/2016 1113   CO2 23 11/21/2016 1113   GLUCOSE 152 (H) 11/21/2016 1113   GLUCOSE 148 (H) 07/19/2016 1453   GLUCOSE 135 09/08/2008   BUN 17 11/21/2016 1113   CREATININE 0.62 11/21/2016 1113   CALCIUM 10.0 11/21/2016 1113   GFRNONAA 90 11/21/2016 1113   GFRAA 103 11/21/2016 1113   Lab Results  Component Value Date   HGBA1C 7.8 (H) 11/21/2016   HGBA1C 7.0 09/27/2016   HGBA1C 7.5 07/05/2016   HGBA1C 9.5 05/08/2016   HGBA1C 9.2 02/08/2016   Lab Results  Component Value Date   INSULIN 76.3 (H) 11/21/2016   CBC    Component Value Date/Time   WBC 7.5 11/21/2016 1113   WBC 8.2 07/19/2016 1453   RBC 5.64 (H) 11/21/2016 1113   RBC 5.77 (H) 07/19/2016 1453   HGB 11.5 11/21/2016 1113   HCT 34.5 11/21/2016 1113   PLT 270 11/21/2016 1113   MCV 61 (L) 11/21/2016 1113   MCH 20.4 (L) 11/21/2016 1113   MCH 19.8 (L) 02/17/2014 1705   MCHC 33.3 11/21/2016 1113   MCHC 31.6 07/19/2016 1453   RDW 15.2 11/21/2016 1113   LYMPHSABS 1.9 11/21/2016 1113   MONOABS 0.7 07/19/2016 1453   EOSABS 0.2 11/21/2016 1113   BASOSABS 0.1 11/21/2016 1113    Iron/TIBC/Ferritin/ %Sat    Component Value Date/Time   IRON 137 05/29/2016 1116   FERRITIN 106.1 05/29/2016 1116   IRONPCTSAT 38.4 05/29/2016 1116   Lipid Panel     Component Value Date/Time   CHOL 156 11/21/2016 1113   TRIG 104 11/21/2016 1113   HDL 44 11/21/2016 1113   CHOLHDL 4 04/13/2016 0804  VLDL 20.8 04/13/2016 0804   LDLCALC 91 11/21/2016 1113   LDLDIRECT 98.0 05/24/2014 1343   Hepatic Function Panel     Component Value Date/Time   PROT 7.3 11/21/2016 1113   ALBUMIN 3.9 11/21/2016 1113   AST 72 (H) 11/21/2016 1113   ALT 80 (H) 11/21/2016 1113   ALKPHOS 82 11/21/2016 1113   BILITOT 0.9 11/21/2016 1113   BILIDIR 0.2 04/13/2016 0804   IBILI 0.5 02/17/2014 1705      Component Value Date/Time   TSH 2.560 11/21/2016 1113   TSH 1.12 07/19/2016 1453   TSH 1.89 10/07/2015 1330    ASSESSMENT AND PLAN: Type 2 diabetes mellitus without complication, with long-term current use of insulin (HCC)  Class 1 obesity with serious comorbidity and body mass index (BMI) of 34.0 to 34.9 in adult, unspecified obesity type  PLAN:  Diabetes II Dakisha has been given extensive diabetes education by myself today including ideal fasting and post-prandial blood glucose readings, individual ideal Hgb A1c goals  and hypoglycemia prevention. We discussed the importance of good blood sugar control to decrease the likelihood of diabetic complications such as nephropathy, neuropathy, limb loss, blindness, coronary artery disease, and death. We discussed the importance of intensive lifestyle modification including diet, exercise and weight loss as the first line treatment for diabetes. Tinesha agrees to continue her diabetes medications and will follow up at the agreed upon time.  We spent > than 50% of the 15 minute visit on the counseling as documented in the note.   Obesity Jada is currently in the action stage of change. As such, her goal is to continue with weight loss efforts She has  agreed to follow the Category 3 plan Jullisa has been instructed to work up to a goal of 150 minutes of combined cardio and strengthening exercise per week for weight loss and overall health benefits. We discussed the following Behavioral Modification Strategies today: increasing lean protein intake and work on meal planning and easy cooking plans  Tashi has agreed to follow up with our clinic in 3 weeks. She was informed of the importance of frequent follow up visits to maximize her success with intensive lifestyle modifications for her multiple health conditions.  I, Doreene Nest, am acting as transcriptionist for Lacy Duverney, PA-C  I have reviewed the above documentation for accuracy and completeness, and I agree with the above. -Lacy Duverney, PA-C  I have reviewed the above note and agree with the plan. -Dennard Nip, MD   OBESITY BEHAVIORAL INTERVENTION VISIT  Today's visit was # 4 out of 22.  Starting weight: 198 lbs Starting date: 11/21/16 Today's weight : 195 lbs Today's date: 01/03/2017 Total lbs lost to date: 3 (Patients must lose 7 lbs in the first 6 months to continue with counseling)   ASK: We discussed the diagnosis of obesity with [name] today and Taiz agreed to give [name] permission to discuss obesity behavioral modification therapy today.  ASSESS: Sakai has the diagnosis of obesity and her BMI today is 32.45 Nazariah is in the action stage of change   ADVISE: Leialoha was educated on the multiple health risks of obesity as well as the benefit of weight loss to improve her health. She was advised of the need for long term treatment and the importance of lifestyle modifications.  AGREE: Multiple dietary modification options and treatment options were discussed and  Lizet agreed to follow the Category 3 plan We discussed the following Behavioral Modification Strategies today: increasing lean protein  intake and work on meal planning and easy cooking plans

## 2017-01-11 ENCOUNTER — Encounter: Payer: Self-pay | Admitting: Adult Health

## 2017-01-15 ENCOUNTER — Encounter: Payer: Self-pay | Admitting: Gastroenterology

## 2017-01-24 ENCOUNTER — Ambulatory Visit (INDEPENDENT_AMBULATORY_CARE_PROVIDER_SITE_OTHER): Payer: Medicare Other | Admitting: Physician Assistant

## 2017-01-24 VITALS — BP 110/63 | HR 63 | Temp 97.0°F | Ht 65.0 in | Wt 196.0 lb

## 2017-01-24 DIAGNOSIS — Z794 Long term (current) use of insulin: Secondary | ICD-10-CM

## 2017-01-24 DIAGNOSIS — E669 Obesity, unspecified: Secondary | ICD-10-CM | POA: Diagnosis not present

## 2017-01-24 DIAGNOSIS — E559 Vitamin D deficiency, unspecified: Secondary | ICD-10-CM | POA: Diagnosis not present

## 2017-01-24 DIAGNOSIS — E119 Type 2 diabetes mellitus without complications: Secondary | ICD-10-CM | POA: Diagnosis not present

## 2017-01-24 DIAGNOSIS — Z6832 Body mass index (BMI) 32.0-32.9, adult: Secondary | ICD-10-CM | POA: Diagnosis not present

## 2017-01-24 MED ORDER — VITAMIN D (ERGOCALCIFEROL) 1.25 MG (50000 UNIT) PO CAPS
50000.0000 [IU] | ORAL_CAPSULE | ORAL | 0 refills | Status: DC
Start: 2017-01-24 — End: 2017-02-26

## 2017-01-24 NOTE — Progress Notes (Signed)
Office: [phone]  /  Fax: [phone]   HPI:   Chief Complaint: OBESITY Breanna Webster is here to discuss her progress with her obesity treatment plan. She is on the  follow the Category 3 plan and is following her eating plan approximately 85 % of the time. She states she is exercising 0 minutes 0 times per week. Breanna Webster is retaining fluids. Her hunger is well controlled and she has been following the plan. She would like more meal planning ideas.  Her weight is 196 lb (88.9 kg) today and has had a weight loss of 1 pounds over a period of 2 weeks since her last visit. She has lost 2 lbs since starting treatment with [name].  Diabetes II Breanna Webster has a diagnosis of diabetes type II. Breanna Webster states patient does not check sugars post prandial but her FBS range from 70-140 and does not report any hypoglycemic episodes. Last A1c was 7.8 on 11/21/16. She has been working on intensive lifestyle modifications including diet, exercise, and weight loss to help control her blood glucose levels.  Vitamin D Deficiency Breanna Webster was informed that low vitamin D levels contributes to fatigue and are associated with obesity, breast, and colon cancer. She agrees to continue to take prescription Vit D @50 ,000 IU every week and will follow up for routine testing of vitamin D, at least 2-3 times per year. She was informed of the risk of over-replacement of vitamin D and agrees to not increase her dose unless he discusses this with [name] first.   ALLERGIES: Allergies  Allergen Reactions  . Statins Other (See Comments)    Muscle aches    MEDICATIONS: Current Outpatient Prescriptions on File Prior to Visit  Medication Sig Dispense Refill  . aspirin 81 MG tablet Take 81 mg by mouth daily.      . bisoprolol (ZEBETA) 5 MG tablet Take 1 tablet (5 mg total) by mouth daily. 90 tablet 3  . escitalopram (LEXAPRO) 20 MG tablet take 1 tablet by mouth once daily 90 tablet 1  . glucose blood (ONETOUCH VERIO) test strip Use to check blood sugar  2 times per day. Dx Code: E11.8 200 each 2  . hydrochlorothiazide (HYDRODIURIL) 12.5 MG tablet Take 1 tablet (12.5 mg total) by mouth daily. 90 tablet 3  . insulin NPH Human (NOVOLIN N RELION) 100 UNIT/ML injection Inject 1.3 mLs (130 Units total) into the skin every morning. (Patient taking differently: Inject 100 Units into the skin every morning. ) 50 mL 2  . Insulin Syringe-Needle U-100 (INSULIN SYRINGE .5CC/30GX1/2") 30G X 1/2" 0.5 ML MISC Use to inject insulin 2 times per day. 100 each 2  . losartan (COZAAR) 50 MG tablet take 1 tablet by mouth once daily 90 tablet 1  . Multiple Vitamin (MULITIVITAMIN WITH MINERALS) TABS Take 1 tablet by mouth daily.    . nortriptyline (PAMELOR) 25 MG capsule Take 1 capsule (25 mg total) by mouth at bedtime. 90 capsule 3  . omeprazole (PRILOSEC) 20 MG capsule Take 20 mg by mouth daily as needed.    [name] DELICA LANCETS 40N MISC Use to check blood sugar 2 times per day. Dx Code: E11.8 200 each 1  . SYNTHROID 150 MCG tablet take 1 tablet by mouth once daily 90 tablet 3  . valsartan (DIOVAN) 160 MG tablet Take 1 tablet (160 mg total) by mouth daily. 90 tablet 3   No current facility-administered medications on file prior to visit.     PAST MEDICAL HISTORY: Past Medical History:  Diagnosis Date  . Arthritis   . Carotid artery stenosis    Mild  . Colon polyps   . Diabetes mellitus   . Diverticulitis   . Diverticulosis   . GERD (gastroesophageal reflux disease)   . Grave's disease   . Hyperlipidemia   . Hypertension   . IBS (irritable bowel syndrome)   . Osteopenia   . Thalassemia minor     PAST SURGICAL HISTORY: Past Surgical History:  Procedure Laterality Date  . CHOLECYSTECTOMY    . HIATAL HERNIA REPAIR    . HYSTEROSCOPY     fibroids  . laparoscopy     fibroids    SOCIAL HISTORY: Social History  Substance Use Topics  . Smoking status: Never Smoker  . Smokeless tobacco: Never Used  . Alcohol use No    FAMILY HISTORY: Family  History  Problem Relation Age of Onset  . Adopted: Yes  . Healthy Son        x 2  . Headache Unknown        Cluster headaches  . Colon cancer Neg Hx   . Pancreatic cancer Neg Hx   . Rectal cancer Neg Hx   . Stomach cancer Neg Hx     ROS: Review of Systems  Constitutional: Positive for weight loss.  Gastrointestinal: Negative for nausea and vomiting.  Musculoskeletal:       Negative muscle weakness  Endo/Heme/Allergies:       Negative hypoglycemia    PHYSICAL EXAM: Blood pressure 110/63, pulse 63, temperature (!) 97 F (36.1 C), height 5' 5"  (1.651 m), weight 196 lb (88.9 kg), SpO2 96 %. Body mass index is 32.62 kg/m. Physical Exam  Constitutional: She appears well-developed and well-nourished.  Cardiovascular: Normal rate.   Pulmonary/Chest: Effort normal.  Skin: Skin is warm and dry.  Psychiatric: She has a normal mood and affect.  Vitals reviewed.   RECENT LABS AND TESTS: BMET    Component Value Date/Time   NA 139 11/21/2016 1113   K 4.3 11/21/2016 1113   CL 101 11/21/2016 1113   CO2 23 11/21/2016 1113   GLUCOSE 152 (H) 11/21/2016 1113   GLUCOSE 148 (H) 07/19/2016 1453   GLUCOSE 135 09/08/2008   BUN 17 11/21/2016 1113   CREATININE 0.62 11/21/2016 1113   CALCIUM 10.0 11/21/2016 1113   GFRNONAA 90 11/21/2016 1113   GFRAA 103 11/21/2016 1113   Lab Results  Component Value Date   HGBA1C 7.8 (H) 11/21/2016   HGBA1C 7.0 09/27/2016   HGBA1C 7.5 07/05/2016   HGBA1C 9.5 05/08/2016   HGBA1C 9.2 02/08/2016   Lab Results  Component Value Date   INSULIN 76.3 (H) 11/21/2016   CBC    Component Value Date/Time   WBC 7.5 11/21/2016 1113   WBC 8.2 07/19/2016 1453   RBC 5.64 (H) 11/21/2016 1113   RBC 5.77 (H) 07/19/2016 1453   HGB 11.5 11/21/2016 1113   HCT 34.5 11/21/2016 1113   PLT 270 11/21/2016 1113   MCV 61 (L) 11/21/2016 1113   MCH 20.4 (L) 11/21/2016 1113   MCH 19.8 (L) 02/17/2014 1705   MCHC 33.3 11/21/2016 1113   MCHC 31.6 07/19/2016 1453   RDW  15.2 11/21/2016 1113   LYMPHSABS 1.9 11/21/2016 1113   MONOABS 0.7 07/19/2016 1453   EOSABS 0.2 11/21/2016 1113   BASOSABS 0.1 11/21/2016 1113   Iron/TIBC/Ferritin/ %Sat    Component Value Date/Time   IRON 137 05/29/2016 1116   FERRITIN 106.1 05/29/2016 1116   IRONPCTSAT 38.4  05/29/2016 1116   Lipid Panel     Component Value Date/Time   CHOL 156 11/21/2016 1113   TRIG 104 11/21/2016 1113   HDL 44 11/21/2016 1113   CHOLHDL 4 04/13/2016 0804   VLDL 20.8 04/13/2016 0804   LDLCALC 91 11/21/2016 1113   LDLDIRECT 98.0 05/24/2014 1343   Hepatic Function Panel     Component Value Date/Time   PROT 7.3 11/21/2016 1113   ALBUMIN 3.9 11/21/2016 1113   AST 72 (H) 11/21/2016 1113   ALT 80 (H) 11/21/2016 1113   ALKPHOS 82 11/21/2016 1113   BILITOT 0.9 11/21/2016 1113   BILIDIR 0.2 04/13/2016 0804   IBILI 0.5 02/17/2014 1705      Component Value Date/Time   TSH 2.560 11/21/2016 1113   TSH 1.12 07/19/2016 1453   TSH 1.89 10/07/2015 1330    ASSESSMENT AND PLAN: Type 2 diabetes mellitus without complication, with long-term current use of insulin (HCC)  Vitamin D deficiency - Plan: Vitamin D, Ergocalciferol, (DRISDOL) 50000 units CAPS capsule  Class 1 obesity with serious comorbidity and body mass index (BMI) of 32.0 to 32.9 in adult, unspecified obesity type  PLAN:  Diabetes II Breanna Webster has been given extensive diabetes education by myself today including ideal fasting and post-prandial blood glucose readings, individual ideal HgA1c goals  and hypoglycemia prevention. We discussed the importance of good blood sugar control to decrease the likelihood of diabetic complications such as nephropathy, neuropathy, limb loss, blindness, coronary artery disease, and death. We discussed the importance of intensive lifestyle modification including diet, exercise and weight loss as the first line treatment for diabetes. Breanna Webster agrees to continue her diabetes medications and will follow up at the  agreed upon time. Will decrease her Insulin to 100 Units daily.   Vitamin D Deficiency Breanna Webster was informed that low vitamin D levels contributes to fatigue and are associated with obesity, breast, and colon cancer. She agrees to continue to take prescription Vit D @50 ,000 IU every week, a refill was written today for a 30 day supply and no refills and will follow up for routine testing of vitamin D, at least 2-3 times per year. She was informed of the risk of over-replacement of vitamin D and agrees to not increase her dose unless he discusses this with [name] first.  Obesity Breanna Webster is currently in the action stage of change. As such, her goal is to continue with weight loss efforts She has agreed to follow the Category 3 plan Breanna Webster has been instructed to work up to a goal of 150 minutes of combined cardio and strengthening exercise per week for weight loss and overall health benefits. We discussed the following Behavioral Modification Stratagies today: increasing lean protein intake and meal planning with cooking strategies  Breanna Webster has agreed to follow up with our clinic in 2 weeks. She was informed of the importance of frequent follow up visits to maximize her success with intensive lifestyle modifications for her multiple health conditions.  I, April Moore, am acting as Location manager for [employer], PA-C  I have reviewed the above documentation for accuracy and completeness, and I agree with the above. -Lacy Duverney, PA-C  I have reviewed the above note and agree with the plan. -Dennard Nip, MD

## 2017-02-02 ENCOUNTER — Other Ambulatory Visit: Payer: Self-pay | Admitting: Endocrinology

## 2017-02-07 ENCOUNTER — Ambulatory Visit: Payer: PRIVATE HEALTH INSURANCE | Admitting: Endocrinology

## 2017-02-08 ENCOUNTER — Ambulatory Visit (INDEPENDENT_AMBULATORY_CARE_PROVIDER_SITE_OTHER): Payer: Medicare Other | Admitting: Endocrinology

## 2017-02-08 ENCOUNTER — Encounter: Payer: Self-pay | Admitting: Endocrinology

## 2017-02-08 VITALS — BP 112/62 | HR 72 | Wt 195.0 lb

## 2017-02-08 DIAGNOSIS — E119 Type 2 diabetes mellitus without complications: Secondary | ICD-10-CM

## 2017-02-08 LAB — POCT GLYCOSYLATED HEMOGLOBIN (HGB A1C): Hemoglobin A1C: 6.1

## 2017-02-08 MED ORDER — INSULIN NPH (HUMAN) (ISOPHANE) 100 UNIT/ML ~~LOC~~ SUSP: 80.0000 [IU] | SUBCUTANEOUS | 2 refills | Status: DC

## 2017-02-08 NOTE — Patient Instructions (Addendum)
Please reduce the NPH insulin to 80 units each morning.   Please call or message [name] next week, to tell us how the blood sugar is doing.  Our goal will be to reduce your insulin to 30-40 units per day.  At that point, we will be ready to try changing the insulin to non-insulin diabetes medication.   check your blood sugar twice a day.  vary the time of day when you check, between before the 3 meals, and at bedtime.  also check if you have symptoms of your blood sugar being too high or too low.  please keep a record of the readings and bring it to your next appointment here (or you can bring the meter itself).  You can write it on any piece of paper.  please call us sooner if your blood sugar goes below 70, or if you have a lot of readings over 200.

## 2017-02-08 NOTE — Progress Notes (Signed)
Subjective:    Patient ID: Breanna Webster, female    DOB: [birth year], 71 y.o.   MRN: [id number]  HPI Pt returns for f/u of diabetes mellitus: DM type: Insulin-requiring type 2 Dx'ed: 2003.  Complications: none.   Therapy: insulin since 2017.     GDM: never.   DKA: never.  Severe hypoglycemia: never.  Pancreatitis: never.   Other: she stopped metformin-XR due to nausea and diarrhea; pioglitizone is precluded by edema.  she takes human insulin, due to cost.   Interval history: no cbg record, but states cbg's vary from 95-125 fasting, which is the only time of day she checks.  She has reduced insulin to 100 units qam.  She says Dr Leafy Ro told her she should change insulin to victoza. Past Medical History:  Diagnosis Date  . Arthritis   . Carotid artery stenosis    Mild  . Colon polyps   . Diabetes mellitus   . Diverticulitis   . Diverticulosis   . GERD (gastroesophageal reflux disease)   . Grave's disease   . Hyperlipidemia   . Hypertension   . IBS (irritable bowel syndrome)   . Osteopenia   . Thalassemia minor     Past Surgical History:  Procedure Laterality Date  . CHOLECYSTECTOMY    . HIATAL HERNIA REPAIR    . HYSTEROSCOPY     fibroids  . laparoscopy     fibroids    Social History   Social History  . Marital status: Married    Spouse name: N/A  . Number of children: 2  . Years of education: N/A   Occupational History  . Retired    Social History Main Topics  . Smoking status: Never Smoker  . Smokeless tobacco: Never Used  . Alcohol use No  . Drug use: No  . Sexual activity: Not on file   Other Topics Concern  . Not on file   Social History Narrative   Lives with husband in a one story home.     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   Education: college.    Current Outpatient Prescriptions on File Prior to Visit  Medication Sig Dispense Refill  . aspirin 81 MG tablet  Take 81 mg by mouth daily.      . bisoprolol (ZEBETA) 5 MG tablet Take 1 tablet (5 mg total) by mouth daily. 90 tablet 3  . escitalopram (LEXAPRO) 20 MG tablet take 1 tablet by mouth once daily 90 tablet 1  . glucose blood (ONETOUCH VERIO) test strip Use to check blood sugar 2 times per day. Dx Code: E11.8 200 each 2  . hydrochlorothiazide (HYDRODIURIL) 12.5 MG tablet Take 1 tablet (12.5 mg total) by mouth daily. 90 tablet 3  . Insulin Syringe-Needle U-100 (INSULIN SYRINGE .5CC/30GX1/2") 30G X 1/2" 0.5 ML MISC Use to inject insulin 2 times per day. 100 each 2  . losartan (COZAAR) 50 MG tablet take 1 tablet by mouth once daily 90 tablet 1  . Multiple Vitamin (MULITIVITAMIN WITH MINERALS) TABS Take 1 tablet by mouth daily.    . nortriptyline (PAMELOR) 25 MG capsule Take 1 capsule (25 mg total) by mouth at bedtime. 90 capsule 3  . omeprazole (PRILOSEC) 20 MG capsule Take 20 mg by mouth daily as needed.    [name] DELICA LANCETS 70V MISC Use to check blood sugar 2 times per day. Dx Code: E11.8 200 each 1  .  SYNTHROID 150 MCG tablet take 1 tablet by mouth once daily 90 tablet 3  . Vitamin D, Ergocalciferol, (DRISDOL) 50000 units CAPS capsule Take 1 capsule (50,000 Units total) by mouth every 7 (seven) days. 4 capsule 0   No current facility-administered medications on file prior to visit.     Allergies  Allergen Reactions  . Statins Other (See Comments)    Muscle aches    Family History  Problem Relation Age of Onset  . Adopted: Yes  . Healthy Son        x 2  . Headache Unknown        Cluster headaches  . Colon cancer Neg Hx   . Pancreatic cancer Neg Hx   . Rectal cancer Neg Hx   . Stomach cancer Neg Hx     BP 112/62   Pulse 72   Wt 195 lb (88.5 kg)   SpO2 93%   BMI 32.45 kg/m    Review of Systems She denies hypoglycemia. She has lost 9 lbs.     Objective:   Physical Exam VITAL SIGNS:  See vs page GENERAL: no distress Pulses: foot pulses are intact bilaterally.     MSK: no deformity of the feet or ankles.  CV: no edema of the legs or ankles Skin:  no ulcer on the feet or ankles.  normal color and temp on the feet and ankles Neuro: sensation is intact to touch on the feet and ankles.     Lab Results  Component Value Date   HGBA1C 6.1 02/08/2017      Assessment & Plan:  Obesity: improved.   Insulin-requiring type 2 DM: overcontrolled: I told pt we can change insulin to victoza at some point, but she requires too much insulin to do so yet.    Patient Instructions  Please reduce the NPH insulin to 80 units each morning.   Please call or message [name] next week, to tell us how the blood sugar is doing.  Our goal will be to reduce your insulin to 30-40 units per day.  At that point, we will be ready to try changing the insulin to non-insulin diabetes medication.   check your blood sugar twice a day.  vary the time of day when you check, between before the 3 meals, and at bedtime.  also check if you have symptoms of your blood sugar being too high or too low.  please keep a record of the readings and bring it to your next appointment here (or you can bring the meter itself).  You can write it on any piece of paper.  please call us sooner if your blood sugar goes below 70, or if you have a lot of readings over 200.

## 2017-02-11 ENCOUNTER — Ambulatory Visit (INDEPENDENT_AMBULATORY_CARE_PROVIDER_SITE_OTHER): Payer: Medicare Other | Admitting: Physician Assistant

## 2017-02-11 VITALS — BP 107/63 | HR 58 | Temp 98.2°F | Ht 65.0 in | Wt 191.0 lb

## 2017-02-11 DIAGNOSIS — E119 Type 2 diabetes mellitus without complications: Secondary | ICD-10-CM

## 2017-02-11 DIAGNOSIS — E669 Obesity, unspecified: Secondary | ICD-10-CM | POA: Diagnosis not present

## 2017-02-11 DIAGNOSIS — Z6831 Body mass index (BMI) 31.0-31.9, adult: Secondary | ICD-10-CM | POA: Diagnosis not present

## 2017-02-11 DIAGNOSIS — Z794 Long term (current) use of insulin: Secondary | ICD-10-CM | POA: Diagnosis not present

## 2017-02-11 DIAGNOSIS — Z23 Encounter for immunization: Secondary | ICD-10-CM | POA: Diagnosis not present

## 2017-02-11 NOTE — Progress Notes (Signed)
I agree with this plan.

## 2017-02-11 NOTE — Progress Notes (Signed)
Office: [phone]  /  Fax: [phone]   HPI:   Chief Complaint: OBESITY Breanna Webster is here to discuss her progress with her obesity treatment plan. She is on the Category 3 plan and is following her eating plan approximately 85 % of the time. She states she is exercising 0 minutes 0 times per week. Breanna Webster continues to do well with weight loss. She states due to restriction of only eating kosher meat, she has not been getting her protein from meat and she is using our high protein vegetarian meal ideas.  She states her hunger is well controlled and on occasions she is not eating all the calories within the plan. Her weight is 191 lb (86.6 kg) today and has had a weight loss of 5 pounds over a period of 2 to 3 weeks since her last visit. She has lost 7 lbs since starting treatment with [name].  Diabetes II Breanna Webster has a diagnosis of diabetes type II. Her A1c has improved to 6.1 from 7.8 and she has decreased her insulin to 80 units. [name]'s fasting BGs range between 90's and 130's and postprandial range between 180's-200's. Breanna Webster denies any hypoglycemic episodes. Last A1c was 6.1 on 02/08/17. She has been working on intensive lifestyle modifications including diet, exercise, and weight loss to help control her blood glucose levels.  ALLERGIES: Allergies  Allergen Reactions  . Statins Other (See Comments)    Muscle aches    MEDICATIONS: Current Outpatient Prescriptions on File Prior to Visit  Medication Sig Dispense Refill  . aspirin 81 MG tablet Take 81 mg by mouth daily.      . bisoprolol (ZEBETA) 5 MG tablet Take 1 tablet (5 mg total) by mouth daily. 90 tablet 3  . escitalopram (LEXAPRO) 20 MG tablet take 1 tablet by mouth once daily 90 tablet 1  . glucose blood (ONETOUCH VERIO) test strip Use to check blood sugar 2 times per day. Dx Code: E11.8 200 each 2  . hydrochlorothiazide (HYDRODIURIL) 12.5 MG tablet Take 1 tablet (12.5 mg total) by mouth daily. 90 tablet 3  . insulin NPH Human (NOVOLIN N  RELION) 100 UNIT/ML injection Inject 0.8 mLs (80 Units total) into the skin every morning. 30 mL 2  . Insulin Syringe-Needle U-100 (INSULIN SYRINGE .5CC/30GX1/2") 30G X 1/2" 0.5 ML MISC Use to inject insulin 2 times per day. 100 each 2  . losartan (COZAAR) 50 MG tablet take 1 tablet by mouth once daily 90 tablet 1  . Multiple Vitamin (MULITIVITAMIN WITH MINERALS) TABS Take 1 tablet by mouth daily.    . nortriptyline (PAMELOR) 25 MG capsule Take 1 capsule (25 mg total) by mouth at bedtime. 90 capsule 3  . omeprazole (PRILOSEC) 20 MG capsule Take 20 mg by mouth daily as needed.    Breanna Webster DELICA LANCETS 76H MISC Use to check blood sugar 2 times per day. Dx Code: E11.8 200 each 1  . SYNTHROID 150 MCG tablet take 1 tablet by mouth once daily 90 tablet 3  . Vitamin D, Ergocalciferol, (DRISDOL) 50000 units CAPS capsule Take 1 capsule (50,000 Units total) by mouth every 7 (seven) days. 4 capsule 0   No current facility-administered medications on file prior to visit.     PAST MEDICAL HISTORY: Past Medical History:  Diagnosis Date  . Arthritis   . Carotid artery stenosis    Mild  . Colon polyps   . Diabetes mellitus   . Diverticulitis   . Diverticulosis   . GERD (gastroesophageal reflux  disease)   . Grave's disease   . Hyperlipidemia   . Hypertension   . IBS (irritable bowel syndrome)   . Osteopenia   . Thalassemia minor     PAST SURGICAL HISTORY: Past Surgical History:  Procedure Laterality Date  . CHOLECYSTECTOMY    . HIATAL HERNIA REPAIR    . HYSTEROSCOPY     fibroids  . laparoscopy     fibroids    SOCIAL HISTORY: Social History  Substance Use Topics  . Smoking status: Never Smoker  . Smokeless tobacco: Never Used  . Alcohol use No    FAMILY HISTORY: Family History  Problem Relation Age of Onset  . Adopted: Yes  . Healthy Son        x 2  . Headache Unknown        Cluster headaches  . Colon cancer Neg Hx   . Pancreatic cancer Neg Hx   . Rectal cancer Neg Hx     . Stomach cancer Neg Hx     ROS: Review of Systems  Constitutional: Positive for weight loss.  Endo/Heme/Allergies:       Negative hypoglycemia    PHYSICAL EXAM: Blood pressure 107/63, pulse (!) 58, temperature 98.2 F (36.8 C), temperature source Oral, height 5' 5"  (1.651 m), weight 191 lb (86.6 kg), SpO2 96 %. Body mass index is 31.78 kg/m. Physical Exam  Constitutional: She is oriented to person, place, and time. She appears well-developed and well-nourished.  Cardiovascular: Normal rate.   Pulmonary/Chest: Effort normal.  Musculoskeletal: Normal range of motion.  Neurological: She is oriented to person, place, and time.  Skin: Skin is warm and dry.  Psychiatric: She has a normal mood and affect. Her behavior is normal.  Vitals reviewed.   RECENT LABS AND TESTS: BMET    Component Value Date/Time   NA 139 11/21/2016 1113   K 4.3 11/21/2016 1113   CL 101 11/21/2016 1113   CO2 23 11/21/2016 1113   GLUCOSE 152 (H) 11/21/2016 1113   GLUCOSE 148 (H) 07/19/2016 1453   GLUCOSE 135 09/08/2008   BUN 17 11/21/2016 1113   CREATININE 0.62 11/21/2016 1113   CALCIUM 10.0 11/21/2016 1113   GFRNONAA 90 11/21/2016 1113   GFRAA 103 11/21/2016 1113   Lab Results  Component Value Date   HGBA1C 6.1 02/08/2017   HGBA1C 7.8 (H) 11/21/2016   HGBA1C 7.0 09/27/2016   HGBA1C 7.5 07/05/2016   HGBA1C 9.5 05/08/2016   Lab Results  Component Value Date   INSULIN 76.3 (H) 11/21/2016   CBC    Component Value Date/Time   WBC 7.5 11/21/2016 1113   WBC 8.2 07/19/2016 1453   RBC 5.64 (H) 11/21/2016 1113   RBC 5.77 (H) 07/19/2016 1453   HGB 11.5 11/21/2016 1113   HCT 34.5 11/21/2016 1113   PLT 270 11/21/2016 1113   MCV 61 (L) 11/21/2016 1113   MCH 20.4 (L) 11/21/2016 1113   MCH 19.8 (L) 02/17/2014 1705   MCHC 33.3 11/21/2016 1113   MCHC 31.6 07/19/2016 1453   RDW 15.2 11/21/2016 1113   LYMPHSABS 1.9 11/21/2016 1113   MONOABS 0.7 07/19/2016 1453   EOSABS 0.2 11/21/2016 1113    BASOSABS 0.1 11/21/2016 1113   Iron/TIBC/Ferritin/ %Sat    Component Value Date/Time   IRON 137 05/29/2016 1116   FERRITIN 106.1 05/29/2016 1116   IRONPCTSAT 38.4 05/29/2016 1116   Lipid Panel     Component Value Date/Time   CHOL 156 11/21/2016 1113   TRIG 104 11/21/2016  1113   HDL 44 11/21/2016 1113   CHOLHDL 4 04/13/2016 0804   VLDL 20.8 04/13/2016 0804   LDLCALC 91 11/21/2016 1113   LDLDIRECT 98.0 05/24/2014 1343   Hepatic Function Panel     Component Value Date/Time   PROT 7.3 11/21/2016 1113   ALBUMIN 3.9 11/21/2016 1113   AST 72 (H) 11/21/2016 1113   ALT 80 (H) 11/21/2016 1113   ALKPHOS 82 11/21/2016 1113   BILITOT 0.9 11/21/2016 1113   BILIDIR 0.2 04/13/2016 0804   IBILI 0.5 02/17/2014 1705      Component Value Date/Time   TSH 2.560 11/21/2016 1113   TSH 1.12 07/19/2016 1453   TSH 1.89 10/07/2015 1330    ASSESSMENT AND PLAN: Type 2 diabetes mellitus without complication, with long-term current use of insulin (HCC)  Class 1 obesity with serious comorbidity and body mass index (BMI) of 31.0 to 31.9 in adult, unspecified obesity type  PLAN:  Diabetes II Breanna Webster has been given extensive diabetes education by myself today including ideal fasting and post-prandial blood glucose readings, individual ideal Hgb A1c goals  and hypoglycemia prevention. We discussed the importance of good blood sugar control to decrease the likelihood of diabetic complications such as nephropathy, neuropathy, limb loss, blindness, coronary artery disease, and death. We discussed the importance of intensive lifestyle modification including diet, exercise and weight loss as the first line treatment for diabetes. Breanna Webster agrees to continue her diabetes medications as prescribed and will follow up with our clinic in 2 weeks.  We spent > than 50% of the 15 minute visit on the counseling as documented in the note.  Obesity Breanna Webster is currently in the action stage of change. As such, her goal is to  continue with weight loss efforts She has agreed to change to follow the Pescatarian eating plan Breanna Webster has been instructed to work up to a goal of 150 minutes of combined cardio and strengthening exercise per week for weight loss and overall health benefits. We discussed the following Behavioral Modification Strategies today: increasing lean protein intake and work on meal planning and easy cooking plans   Breanna Webster has agreed to follow up with our clinic in 2 weeks. She was informed of the importance of frequent follow up visits to maximize her success with intensive lifestyle modifications for her multiple health conditions.  I, Trixie Dredge, am acting as transcriptionist for Lacy Duverney, PA-C  I have reviewed the above documentation for accuracy and completeness, and I agree with the above. -Lacy Duverney, PA-C  I have reviewed the above note and agree with the plan. -Dennard Nip, MD     Today's visit was # 6 out of 22.  Starting weight: 198 lbs Starting date: 11/21/16 Today's weight : 191 lbs Today's date: 02/11/2017 Total lbs lost to date: 7 (Patients must lose 7 lbs in the first 6 months to continue with counseling)   ASK: We discussed the diagnosis of obesity with Breanna Webster today and Breanna Webster agreed to give [name] permission to discuss obesity behavioral modification therapy today.  ASSESS: Breanna Webster has the diagnosis of obesity and her BMI today is 76 Breanna Webster is in the action stage of change   ADVISE: Breanna Webster was educated on the multiple health risks of obesity as well as the benefit of weight loss to improve her health. She was advised of the need for long term treatment and the importance of lifestyle modifications.  AGREE: Multiple dietary modification options and treatment options were discussed and  Breanna Webster agreed  to follow the Pescatarian eating plan We discussed the following Behavioral Modification Strategies today: increasing lean protein intake and work on meal planning and easy  cooking plans

## 2017-02-13 ENCOUNTER — Ambulatory Visit (INDEPENDENT_AMBULATORY_CARE_PROVIDER_SITE_OTHER): Payer: Medicare Other | Admitting: Physician Assistant

## 2017-02-13 ENCOUNTER — Encounter: Payer: Self-pay | Admitting: Physician Assistant

## 2017-02-13 VITALS — BP 98/54 | HR 71 | Ht 65.0 in | Wt 194.0 lb

## 2017-02-13 DIAGNOSIS — Z860101 Personal history of adenomatous and serrated colon polyps: Secondary | ICD-10-CM

## 2017-02-13 DIAGNOSIS — R059 Cough, unspecified: Secondary | ICD-10-CM

## 2017-02-13 DIAGNOSIS — R05 Cough: Secondary | ICD-10-CM | POA: Diagnosis not present

## 2017-02-13 DIAGNOSIS — K219 Gastro-esophageal reflux disease without esophagitis: Secondary | ICD-10-CM

## 2017-02-13 DIAGNOSIS — Z86010 Personal history of colonic polyps: Secondary | ICD-10-CM | POA: Diagnosis not present

## 2017-02-13 MED ORDER — NA SULFATE-K SULFATE-MG SULF 17.5-3.13-1.6 GM/177ML PO SOLN: ORAL | 0 refills | Status: DC

## 2017-02-13 NOTE — Progress Notes (Signed)
Subjective:    Patient ID: Breanna Webster, female    DOB: [birth year], 71 y.o.   MRN: [id number]  HPI Breanna Webster is a pleasant 71 year old white female, established with Breanna Webster who comes in today to discuss endoscopy in addition to all the planned colonoscopy. She was last seen in our office in 2017.  She has history of chronic GERD status post very remote Nissen fundoplication. She had EGD in 2002 per Breanna Webster with question of Barrett's esophagitis however biopsies did not show any evidence of intestinal metaplasia. She had repeat EGD in 2006 at [facility] with no mention of Barrett's. She has had long-term reflux symptoms, she says she is primarily bothered by periodic cough currently. She says she is not sure what sets it off but may have a couple of episodes per year of ongoing cough and throat clearing at which time she takes twice daily Prilosec for couple of weeks and then comes back off. She says she does not want to stay on any medication that she doesn't absolutely need in between. She has no complaints of heartburn or indigestion on a regular basis and no dysphagia. She wants her esophagus looked at again because of her long history of reflux and concerns for Barrett's and developing esophageal cancer. Last colonoscopy April 2015 with finding of moderate diverticulosis and 3 polyps were removed all of which were tubular adenomas, the largest 5 mm also noted have internal hemorrhoids and was recommended for 3 year interval follow-up. No current complaints of abdominal pain and changes in bowel habits melena or hematochezia . Other medical problems include hypertension carotid disease .congestive heart failure with EF 60%, mild aortic stenosis, adult-onset diabetes mellitus and hepatic steatosis.  Review of Systems Pertinent positive and negative review of systems were noted in the above HPI section.  All other review of systems was otherwise negative.  Outpatient Encounter  Prescriptions as of 02/13/2017  Medication Sig  . aspirin 81 MG tablet Take 81 mg by mouth daily.    . bisoprolol (ZEBETA) 5 MG tablet Take 1 tablet (5 mg total) by mouth daily.  Marland Kitchen escitalopram (LEXAPRO) 20 MG tablet take 1 tablet by mouth once daily  . glucose blood (ONETOUCH VERIO) test strip Use to check blood sugar 2 times per day. Dx Code: E11.8  . hydrochlorothiazide (HYDRODIURIL) 12.5 MG tablet Take 1 tablet (12.5 mg total) by mouth daily.  . insulin NPH Human (NOVOLIN N RELION) 100 UNIT/ML injection Inject 0.8 mLs (80 Units total) into the skin every morning.  . Insulin Syringe-Needle U-100 (INSULIN SYRINGE .5CC/30GX1/2") 30G X 1/2" 0.5 ML MISC Use to inject insulin 2 times per day.  . losartan (COZAAR) 50 MG tablet take 1 tablet by mouth once daily  . Multiple Vitamin (MULITIVITAMIN WITH MINERALS) TABS Take 1 tablet by mouth daily.  . nortriptyline (PAMELOR) 25 MG capsule Take 1 capsule (25 mg total) by mouth at bedtime.  Marland Kitchen omeprazole (PRILOSEC) 20 MG capsule Take 20 mg by mouth daily as needed.  Breanna Webster DELICA LANCETS 15T MISC Use to check blood sugar 2 times per day. Dx Code: E11.8  . SYNTHROID 150 MCG tablet take 1 tablet by mouth once daily  . Vitamin D, Ergocalciferol, (DRISDOL) 50000 units CAPS capsule Take 1 capsule (50,000 Units total) by mouth every 7 (seven) days.  . Na Sulfate-K Sulfate-Mg Sulf 17.5-3.13-1.6 GM/177ML SOLN Take as directed for colonoscopy.   No facility-administered encounter medications on file as of 02/13/2017.    Allergies  Allergen Reactions  . Statins Other (See Comments)    Muscle aches   Patient Active Problem List   Diagnosis Date Noted  . Elevated liver function tests 12/05/2016  . Type 2 diabetes mellitus with hypoglycemia without coma, with long-term current use of insulin ([place]) 12/05/2016  . Dyspnea on exertion 07/19/2016  . Chest tightness 07/04/2016  . Thalassemia minor 05/29/2016  . Aortic stenosis, mild 12/20/2015  . Left bundle  branch block 12/06/2015  . Upper airway cough syndrome 10/14/2015  . Diastolic CHF ([place]) 56/38/7564  . Pain of right lower leg 06/29/2015  . Myalgia 02/17/2014  . Carotid artery stenosis 06/05/2013  . Eustachian tube dysfunction 05/07/2013  . Neuropathy of leg 03/07/2012  . Anemia, unspecified 01/31/2012  . Physical exam, annual 01/20/2012  . Fatigue 01/16/2012  . Hot flashes 08/24/2010  . Essential hypertension, benign 07/20/2010  . Diabetes mellitus type 2, controlled ([place]) 06/30/2010  . PELVIC  PAIN 03/24/2010  . FATTY LIVER DISEASE 12/29/2009  . Obesity 12/21/2009  . KNEE PAIN 09/12/2009  . HAND PAIN 09/12/2009  . IBS 04/29/2009  . Cardiomegaly 04/11/2009  . Vitamin D deficiency 03/10/2009  . Hypothyroidism 12/13/2008  . Dyslipidemia 12/13/2008  . ANXIETY STATE, UNSPECIFIED 12/13/2008  . Other specified disorders of bladder 12/13/2008  . Disorder of bone and cartilage 12/13/2008  . NECK MASS 12/13/2008  . PALPITATIONS 12/13/2008   Social History   Social History  . Marital status: Married    Spouse name: N/A  . Number of children: 2  . Years of education: N/A   Occupational History  . Retired    Social History Main Topics  . Smoking status: Never Smoker  . Smokeless tobacco: Never Used  . Alcohol use No  . Drug use: No  . Sexual activity: Not on file   Other Topics Concern  . Not on file   Social History Narrative   Lives with husband in a one story home.     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   Education: college.    Breanna Webster family history includes Headache in her unknown relative; Healthy in her son. She was adopted.      Objective:    Vitals:   02/13/17 1007  BP: (!) 98/54  Pulse: 71    Physical Exam well-developed older white female in no acute distress, pleasant blood pressure 98/54 pulse 71, BMI 32.2. HEENT; nontraumatic normocephalic EOMI PERRLA sclera  anicteric, Cardiovascular; regular rate and rhythm with S1-S2 no murmur rub or gallop, Pulmonary; clear bilaterally, Abdomen; soft, nontender nondistended bowel sounds are active there is no palpable mass or hepatosplenomegaly, Rectal; exam not done, Ext; no clubbing cyanosis or edema skin warm and dry, Neuropsych; mood and affect appropriate       Assessment & Plan:   #81 71 year old white female with history of adenomatous colon polyps, due for follow-up colonoscopy last exam April 2015 with 3 polyps removed. #2 Long history of GERD status post remote Nissen fundoplication, remote EGD in 2002 with question of Barrett's, biopsies were negative Current GERD symptoms manifested by periodic prolonged coughing episodes which may last for a couple of weeks #3 hypertension #4 congestive heart failure mild with EF 60%   #5 adult-onset diabetes mellitus   #6 carotid disease #7 mild aortic stenosis   #8 hepatic steatosis  Plan; Patient will be scheduled for upper Endoscopy to evaluate for Barrett's and biopsy and Colonoscopy with Dr.  [name]. Both procedures discussed in detail with patient including risks and benefits and she is agreeable to proceed. Patient will continue using high dose Prilosec on a when necessary basis.   Breanna S [name] PA-C 02/13/2017   Cc: Breanna Peng, NP

## 2017-02-13 NOTE — Patient Instructions (Addendum)
You have been scheduled for a colonoscopy and endoscopy. Please follow written instructions given to you at your visit today.  Please pick up your prep supplies at the pharmacy within the next 1-3 days. [facility] [address]  If you use inhalers (even only as needed), please bring them with you on the day of your procedure. Your physician has requested that you go to www.startemmi.com and enter the access code given to you at your visit today. This web site gives a general overview about your procedure. However, you should still follow specific instructions given to you by our office regarding your preparation for the procedure.  If you are age 71 or older, your body mass index should be between 23-30. Your Body mass index is 32.28 kg/m. If this is out of the aforementioned range listed, please consider follow up with your Primary Care Provider.

## 2017-02-13 NOTE — Progress Notes (Signed)
Agree with assessment and plan as outlined.  

## 2017-02-15 ENCOUNTER — Encounter: Payer: Self-pay | Admitting: Endocrinology

## 2017-02-15 DIAGNOSIS — E119 Type 2 diabetes mellitus without complications: Secondary | ICD-10-CM | POA: Diagnosis not present

## 2017-02-15 LAB — HM DIABETES EYE EXAM

## 2017-02-26 ENCOUNTER — Ambulatory Visit (INDEPENDENT_AMBULATORY_CARE_PROVIDER_SITE_OTHER): Payer: Medicare Other | Admitting: Physician Assistant

## 2017-02-26 VITALS — BP 102/64 | HR 55 | Temp 97.9°F | Ht 65.0 in | Wt 193.0 lb

## 2017-02-26 DIAGNOSIS — Z6832 Body mass index (BMI) 32.0-32.9, adult: Secondary | ICD-10-CM | POA: Diagnosis not present

## 2017-02-26 DIAGNOSIS — E559 Vitamin D deficiency, unspecified: Secondary | ICD-10-CM | POA: Diagnosis not present

## 2017-02-26 DIAGNOSIS — E669 Obesity, unspecified: Secondary | ICD-10-CM | POA: Diagnosis not present

## 2017-02-26 DIAGNOSIS — E119 Type 2 diabetes mellitus without complications: Secondary | ICD-10-CM | POA: Insufficient documentation

## 2017-02-26 DIAGNOSIS — E038 Other specified hypothyroidism: Secondary | ICD-10-CM

## 2017-02-26 NOTE — Progress Notes (Signed)
Office: [phone]  /  Fax: [phone]   HPI:   Chief Complaint: OBESITY Breanna Webster is here to discuss her progress with her obesity treatment plan. She is on the Category 3 plan and is following her eating plan approximately 80 % of the time. She states she is exercising 0 minutes 0 times per week. Breanna Webster had increased celebration eating. She tried to make better food choices and control her portions. Breanna Webster would like more meal planing ideas. Her weight is 193 lb (87.5 kg) today and has had a weight gain of 2 pounds over a period of 2 weeks since her last visit. She has lost 5 lbs since starting treatment with Breanna Webster.  Vitamin D deficiency Breanna Webster has a diagnosis of vitamin D deficiency. She stopped taking vit D due to metallic taste and denies nausea, vomiting or muscle weakness.  Diabetes II Breanna Webster has a diagnosis of diabetes type II. Breanna Webster states fasting BGs range between 80's and 150's and post prandial range between 110 and 210's and denies any hypoglycemic episodes. Last A1c was at 6.1 She has been working on intensive lifestyle modifications including diet, exercise, and weight loss to help control her blood glucose levels.  Hypothyroidism Breanna Webster has a diagnosis of hypothyroidism. Breanna Webster is on synthroid currently and she denies hot or cold intolerance or palpitations, but does admit to ongoing fatigue.   ALLERGIES: Allergies  Allergen Reactions   Statins Other (See Comments)    Muscle aches    MEDICATIONS: Current Outpatient Medications on File Prior to Visit  Medication Sig Dispense Refill   aspirin 81 MG tablet Take 81 mg by mouth daily.       bisoprolol (ZEBETA) 5 MG tablet Take 1 tablet (5 mg total) by mouth daily. 90 tablet 3   escitalopram (LEXAPRO) 20 MG tablet take 1 tablet by mouth once daily 90 tablet 1   glucose blood (ONETOUCH VERIO) test strip Use to check blood sugar 2 times per day. Dx Code: E11.8 200 each 2   hydrochlorothiazide (HYDRODIURIL) 12.5 MG tablet Take 1  tablet (12.5 mg total) by mouth daily. 90 tablet 3   insulin NPH Human (NOVOLIN N RELION) 100 UNIT/ML injection Inject 0.8 mLs (80 Units total) into the skin every morning. 30 mL 2   Insulin Syringe-Needle U-100 (INSULIN SYRINGE .5CC/30GX1/2") 30G X 1/2" 0.5 ML MISC Use to inject insulin 2 times per day. 100 each 2   losartan (COZAAR) 50 MG tablet take 1 tablet by mouth once daily 90 tablet 1   Multiple Vitamin (MULITIVITAMIN WITH MINERALS) TABS Take 1 tablet by mouth daily.     Na Sulfate-K Sulfate-Mg Sulf 17.5-3.13-1.6 GM/177ML SOLN Take as directed for colonoscopy. 354 mL 0   nortriptyline (PAMELOR) 25 MG capsule Take 1 capsule (25 mg total) by mouth at bedtime. 90 capsule 3   omeprazole (PRILOSEC) 20 MG capsule Take 20 mg by mouth daily as needed.     ONETOUCH DELICA LANCETS 64B MISC Use to check blood sugar 2 times per day. Dx Code: E11.8 200 each 1   SYNTHROID 150 MCG tablet take 1 tablet by mouth once daily 90 tablet 3   No current facility-administered medications on file prior to visit.     PAST MEDICAL HISTORY: Past Medical History:  Diagnosis Date   Arthritis    Carotid artery stenosis    Mild   Colon polyps    Diabetes mellitus    Diverticulitis    Diverticulosis    GERD (gastroesophageal reflux disease)  Grave's disease    Hyperlipidemia    Hypertension    IBS (irritable bowel syndrome)    Osteopenia    Thalassemia minor     PAST SURGICAL HISTORY: Past Surgical History:  Procedure Laterality Date   CHOLECYSTECTOMY     HIATAL HERNIA REPAIR     HYSTEROSCOPY     fibroids   laparoscopy     fibroids    SOCIAL HISTORY: Social History   Tobacco Use   Smoking status: Never Smoker   Smokeless tobacco: Never Used  Substance Use Topics   Alcohol use: No    Alcohol/week: 0.0 oz   Drug use: No    FAMILY HISTORY: Family History  Adopted: Yes  Problem Relation Age of Onset   Healthy Son        x 2   Headache Unknown         Cluster headaches   Colon cancer Neg Hx    Pancreatic cancer Neg Hx    Rectal cancer Neg Hx    Stomach cancer Neg Hx     ROS: Review of Systems  Constitutional: Positive for malaise/fatigue. Negative for weight loss.  Cardiovascular: Negative for palpitations.  Gastrointestinal: Negative for nausea and vomiting.  Musculoskeletal:       Negative muscle weakness  Endo/Heme/Allergies:       Negative hypoglycemia Negative Heat/Cold Intolerance    PHYSICAL EXAM: Blood pressure 102/64, pulse (!) 55, temperature 97.9 F (36.6 C), height 5' 5"  (1.651 m), weight 193 lb (87.5 kg), SpO2 96 %. Body mass index is 32.12 kg/m. Physical Exam  Constitutional: She is oriented to person, place, and time. She appears well-developed and well-nourished.  Cardiovascular:  Bradycardic  Pulmonary/Chest: Effort normal.  Musculoskeletal: Normal range of motion.  Neurological: She is oriented to person, place, and time.  Skin: Skin is warm and dry.  Psychiatric: She has a normal mood and affect. Her behavior is normal.  Vitals reviewed.   RECENT LABS AND TESTS: BMET    Component Value Date/Time   NA 139 11/21/2016 1113   K 4.3 11/21/2016 1113   CL 101 11/21/2016 1113   CO2 23 11/21/2016 1113   GLUCOSE 152 (H) 11/21/2016 1113   GLUCOSE 148 (H) 07/19/2016 1453   GLUCOSE 135 09/08/2008   BUN 17 11/21/2016 1113   CREATININE 0.62 11/21/2016 1113   CALCIUM 10.0 11/21/2016 1113   GFRNONAA 90 11/21/2016 1113   GFRAA 103 11/21/2016 1113   Lab Results  Component Value Date   HGBA1C 6.1 02/08/2017   HGBA1C 7.8 (H) 11/21/2016   HGBA1C 7.0 09/27/2016   HGBA1C 7.5 07/05/2016   HGBA1C 9.5 05/08/2016   Lab Results  Component Value Date   INSULIN 76.3 (H) 11/21/2016   CBC    Component Value Date/Time   WBC 7.5 11/21/2016 1113   WBC 8.2 07/19/2016 1453   RBC 5.64 (H) 11/21/2016 1113   RBC 5.77 (H) 07/19/2016 1453   HGB 11.5 11/21/2016 1113   HCT 34.5 11/21/2016 1113   PLT 270  11/21/2016 1113   MCV 61 (L) 11/21/2016 1113   MCH 20.4 (L) 11/21/2016 1113   MCH 19.8 (L) 02/17/2014 1705   MCHC 33.3 11/21/2016 1113   MCHC 31.6 07/19/2016 1453   RDW 15.2 11/21/2016 1113   LYMPHSABS 1.9 11/21/2016 1113   MONOABS 0.7 07/19/2016 1453   EOSABS 0.2 11/21/2016 1113   BASOSABS 0.1 11/21/2016 1113   Iron/TIBC/Ferritin/ %Sat    Component Value Date/Time   IRON 137 05/29/2016 1116  FERRITIN 106.1 05/29/2016 1116   IRONPCTSAT 38.4 05/29/2016 1116   Lipid Panel     Component Value Date/Time   CHOL 156 11/21/2016 1113   TRIG 104 11/21/2016 1113   HDL 44 11/21/2016 1113   CHOLHDL 4 04/13/2016 0804   VLDL 20.8 04/13/2016 0804   LDLCALC 91 11/21/2016 1113   LDLDIRECT 98.0 05/24/2014 1343   Hepatic Function Panel     Component Value Date/Time   PROT 7.3 11/21/2016 1113   ALBUMIN 3.9 11/21/2016 1113   AST 72 (H) 11/21/2016 1113   ALT 80 (H) 11/21/2016 1113   ALKPHOS 82 11/21/2016 1113   BILITOT 0.9 11/21/2016 1113   BILIDIR 0.2 04/13/2016 0804   IBILI 0.5 02/17/2014 1705      Component Value Date/Time   TSH 2.560 11/21/2016 1113   TSH 1.12 07/19/2016 1453   TSH 1.89 10/07/2015 1330    ASSESSMENT AND PLAN: Type 2 diabetes mellitus without complication, without long-term current use of insulin ([place]) - Plan: Comprehensive metabolic panel, Hemoglobin A1c, Insulin, random, CBC With Differential, CANCELED: CBC With Differential  Vitamin D deficiency - Plan: VITAMIN D 25 Hydroxy (Vit-D Deficiency, Fractures)  Other specified hypothyroidism - Plan: T3, T4, free, TSH  Class 1 obesity with serious comorbidity and body mass index (BMI) of 32.0 to 32.9 in adult, unspecified obesity type  PLAN:  Vitamin D Deficiency Breanna Webster was informed that low vitamin D levels contributes to fatigue and are associated with obesity, breast, and colon cancer. We will check labs and will follow up for routine testing of vitamin D, at least 2-3 times per year.   Diabetes II Breanna Webster has  been given extensive diabetes education by myself today including ideal fasting and post-prandial blood glucose readings, individual ideal Hgb A1c goals  and hypoglycemia prevention. We discussed the importance of good blood sugar control to decrease the likelihood of diabetic complications such as nephropathy, neuropathy, limb loss, blindness, coronary artery disease, and death. We discussed the importance of intensive lifestyle modification including diet, exercise and weight loss as the first line treatment for diabetes. We will check labs and Breanna Webster agrees to continue her diabetes medications and will follow up at the agreed upon time.  Hypothyroidism Breanna Webster was informed of the importance of good thyroid control to help with weight loss efforts. She was also informed that supertheraputic thyroid levels are dangerous and will not improve weight loss results. We will check labs and Breanna Webster agrees to follow up at the agreed upon time.  Obesity Breanna Webster is currently in the action stage of change. As such, her goal is to continue with weight loss efforts She has agreed to follow the Category 3 plan Breanna Webster has been instructed to work up to a goal of 150 minutes of combined cardio and strengthening exercise per week for weight loss and overall health benefits. We discussed the following Behavioral Modification Strategies today: increasing lean protein intake and work on meal planning and easy cooking plans  Breanna Webster has agreed to follow up with our clinic in 2 weeks. She was informed of the importance of frequent follow up visits to maximize her success with intensive lifestyle modifications for her multiple health conditions.  I, Breanna Webster, am acting as transcriptionist for Breanna Duverney, PA-C  I have reviewed the above documentation for accuracy and completeness, and I agree with the above. -Breanna Duverney, PA-C  I have reviewed the above note and agree with the plan. -Dennard Nip, MD   OBESITY BEHAVIORAL  INTERVENTION VISIT  Today's visit was # 7 out of 22.  Starting weight: 198 lbs Starting date: 11/21/16 Today's weight : 193 lbs Today's date: 02/26/2017 Total lbs lost to date: 5 (Patients must lose 7 lbs in the first 6 months to continue with counseling)   ASK: We discussed the diagnosis of obesity with [name] today and [place] agreed to give Breanna Webster permission to discuss obesity behavioral modification therapy today.  ASSESS: Carlethia has the diagnosis of obesity and her BMI today is 32.12 Vasti is in the action stage of change   ADVISE: Vanshika was educated on the multiple health risks of obesity as well as the benefit of weight loss to improve her health. She was advised of the need for long term treatment and the importance of lifestyle modifications.  AGREE: Multiple dietary modification options and treatment options were discussed and  Romonda agreed to follow the Category 3 plan We discussed the following Behavioral Modification Strategies today: increasing lean protein intake and work on meal planning and easy cooking plans

## 2017-02-27 ENCOUNTER — Telehealth: Payer: Self-pay | Admitting: Endocrinology

## 2017-02-27 LAB — CBC WITH DIFFERENTIAL
Basophils Absolute: 0 10*3/uL (ref 0.0–0.2)
Basos: 0 %
EOS (ABSOLUTE): 0.1 10*3/uL (ref 0.0–0.4)
Eos: 2 %
Hematocrit: 33.3 % — ABNORMAL LOW (ref 34.0–46.6)
Hemoglobin: 10.8 g/dL — ABNORMAL LOW (ref 11.1–15.9)
Immature Grans (Abs): 0 10*3/uL (ref 0.0–0.1)
Immature Granulocytes: 0 %
Lymphocytes Absolute: 1.2 10*3/uL (ref 0.7–3.1)
Lymphs: 16 %
MCH: 19.9 pg — ABNORMAL LOW (ref 26.6–33.0)
MCHC: 32.4 g/dL (ref 31.5–35.7)
MCV: 61 fL — ABNORMAL LOW (ref 79–97)
Monocytes Absolute: 0.5 10*3/uL (ref 0.1–0.9)
Monocytes: 7 %
Neutrophils Absolute: 5.3 10*3/uL (ref 1.4–7.0)
Neutrophils: 75 %
RBC: 5.42 x10E6/uL — ABNORMAL HIGH (ref 3.77–5.28)
RDW: 15.9 % — ABNORMAL HIGH (ref 12.3–15.4)
WBC: 7.1 10*3/uL (ref 3.4–10.8)

## 2017-02-27 LAB — COMPREHENSIVE METABOLIC PANEL
ALT: 33 IU/L — ABNORMAL HIGH (ref 0–32)
AST: 34 IU/L (ref 0–40)
Albumin/Globulin Ratio: 1.3 (ref 1.2–2.2)
Albumin: 3.9 g/dL (ref 3.5–4.8)
Alkaline Phosphatase: 71 IU/L (ref 39–117)
BUN/Creatinine Ratio: 26 (ref 12–28)
BUN: 22 mg/dL (ref 8–27)
Bilirubin Total: 0.7 mg/dL (ref 0.0–1.2)
CO2: 24 mmol/L (ref 20–29)
Calcium: 9.6 mg/dL (ref 8.7–10.3)
Chloride: 103 mmol/L (ref 96–106)
Creatinine, Ser: 0.84 mg/dL (ref 0.57–1.00)
GFR calc Af Amer: 80 mL/min/{1.73_m2} (ref >59)
GFR calc non Af Amer: 69 mL/min/{1.73_m2} (ref >59)
Globulin, Total: 3 g/dL (ref 1.5–4.5)
Glucose: 142 mg/dL — ABNORMAL HIGH (ref 65–99)
Potassium: 4.2 mmol/L (ref 3.5–5.2)
Sodium: 141 mmol/L (ref 134–144)
Total Protein: 6.9 g/dL (ref 6.0–8.5)

## 2017-02-27 LAB — TSH: TSH: 5.17 u[IU]/mL — ABNORMAL HIGH (ref 0.450–4.500)

## 2017-02-27 LAB — T3: T3, Total: 89 ng/dL (ref 71–180)

## 2017-02-27 LAB — VITAMIN D 25 HYDROXY (VIT D DEFICIENCY, FRACTURES): Vit D, 25-Hydroxy: 34.2 ng/mL (ref 30.0–100.0)

## 2017-02-27 LAB — T4, FREE: Free T4: 1.24 ng/dL (ref 0.82–1.77)

## 2017-02-27 MED ORDER — LEVOTHYROXINE SODIUM 175 MCG PO TABS: 175.0000 ug | ORAL_TABLET | Freq: Every day | ORAL | 2 refills | Status: DC

## 2017-02-27 NOTE — Telephone Encounter (Signed)
Called patient & she stated that she just picked up new dose of synthroid.

## 2017-02-27 NOTE — Progress Notes (Signed)
Patient made aware of the lab result and will be calling her Endocrinologist to follow up. Kamdon Reisig, [place]

## 2017-02-27 NOTE — Telephone Encounter (Signed)
Ok, I have sent a prescription to your pharmacy, to increase the synthroid. I'll see you next time.

## 2017-02-27 NOTE — Telephone Encounter (Signed)
Re: [facility]-they have been doing patient's blood testing. Thyroid levels have gone up. Weight Loss center wants Dr. Loanne Drilling to know this. She is currently taking 150 mcg of synthroid 1 x day. Patient stated her test results are accessible online. Patient wants you to call her to advise her what to do at ph# [phone]. You can leave a message.

## 2017-03-04 DIAGNOSIS — Z124 Encounter for screening for malignant neoplasm of cervix: Secondary | ICD-10-CM | POA: Diagnosis not present

## 2017-03-07 ENCOUNTER — Encounter: Payer: BLUE CROSS/BLUE SHIELD | Admitting: Gastroenterology

## 2017-03-07 ENCOUNTER — Encounter: Payer: Medicare Other | Admitting: Gastroenterology

## 2017-03-08 ENCOUNTER — Other Ambulatory Visit: Payer: Self-pay | Admitting: Obstetrics

## 2017-03-08 DIAGNOSIS — M8580 Other specified disorders of bone density and structure, unspecified site: Secondary | ICD-10-CM

## 2017-03-08 DIAGNOSIS — R29890 Loss of height: Secondary | ICD-10-CM

## 2017-03-11 ENCOUNTER — Other Ambulatory Visit: Payer: Self-pay | Admitting: Obstetrics

## 2017-03-11 DIAGNOSIS — M8580 Other specified disorders of bone density and structure, unspecified site: Secondary | ICD-10-CM

## 2017-03-12 ENCOUNTER — Ambulatory Visit (INDEPENDENT_AMBULATORY_CARE_PROVIDER_SITE_OTHER): Payer: Medicare Other | Admitting: Physician Assistant

## 2017-03-12 VITALS — BP 119/65 | HR 61 | Temp 98.0°F | Ht 65.0 in | Wt 192.0 lb

## 2017-03-12 DIAGNOSIS — E669 Obesity, unspecified: Secondary | ICD-10-CM

## 2017-03-12 DIAGNOSIS — Z6832 Body mass index (BMI) 32.0-32.9, adult: Secondary | ICD-10-CM | POA: Diagnosis not present

## 2017-03-12 DIAGNOSIS — D509 Iron deficiency anemia, unspecified: Secondary | ICD-10-CM

## 2017-03-13 NOTE — Progress Notes (Signed)
Office: [phone]  /  Fax: [phone]   HPI:   Chief Complaint: OBESITY Breanna Webster is here to discuss her progress with her obesity treatment plan. She is on the Category 3 plan and is following her eating plan approximately 80 % of the time. She states she is exercising 0 minutes 0 times per week. Breanna Webster continues to do well with weight loss. She would like holiday eating strategies.  Her weight is 192 lb (87.1 kg) today and has had a weight loss of 1 pound over a period of 2 weeks since her last visit. She has lost 6 lbs since starting treatment with [name].  Microcytic Anemia Breanna Webster has Hgb of 108, MCV of 61, RBC elevated at 5.42. She admits to history of Thalassemia Minor but state has never had blood study to confirm her diagnosis. She states she has been told in the past to avoid taking Iron pills due to her hx of Thalassemia. She admits to occasional metalic taste in her mouth. She denies any chest pain, dyspnea, dizziness, or excessive fatigue.  ALLERGIES: Allergies  Allergen Reactions  . Statins Other (See Comments)    Muscle aches    MEDICATIONS: Current Outpatient Medications on File Prior to Visit  Medication Sig Dispense Refill  . aspirin 81 MG tablet Take 81 mg by mouth daily.      . bisoprolol (ZEBETA) 5 MG tablet Take 1 tablet (5 mg total) by mouth daily. 90 tablet 3  . escitalopram (LEXAPRO) 20 MG tablet take 1 tablet by mouth once daily 90 tablet 1  . glucose blood (ONETOUCH VERIO) test strip Use to check blood sugar 2 times per day. Dx Code: E11.8 200 each 2  . hydrochlorothiazide (HYDRODIURIL) 12.5 MG tablet Take 1 tablet (12.5 mg total) by mouth daily. 90 tablet 3  . insulin NPH Human (NOVOLIN N RELION) 100 UNIT/ML injection Inject 0.8 mLs (80 Units total) into the skin every morning. 30 mL 2  . Insulin Syringe-Needle U-100 (INSULIN SYRINGE .5CC/30GX1/2") 30G X 1/2" 0.5 ML MISC Use to inject insulin 2 times per day. 100 each 2  . levothyroxine (SYNTHROID, LEVOTHROID) 175  MCG tablet Take 1 tablet (175 mcg total) daily before breakfast by mouth. 90 tablet 2  . losartan (COZAAR) 50 MG tablet take 1 tablet by mouth once daily 90 tablet 1  . Multiple Vitamin (MULITIVITAMIN WITH MINERALS) TABS Take 1 tablet by mouth daily.    . Na Sulfate-K Sulfate-Mg Sulf 17.5-3.13-1.6 GM/177ML SOLN Take as directed for colonoscopy. 354 mL 0  . nortriptyline (PAMELOR) 25 MG capsule Take 1 capsule (25 mg total) by mouth at bedtime. 90 capsule 3  . omeprazole (PRILOSEC) 20 MG capsule Take 20 mg by mouth daily as needed.    [name] DELICA LANCETS 59Y MISC Use to check blood sugar 2 times per day. Dx Code: E11.8 200 each 1   No current facility-administered medications on file prior to visit.     PAST MEDICAL HISTORY: Past Medical History:  Diagnosis Date  . Arthritis   . Carotid artery stenosis    Mild  . Colon polyps   . Diabetes mellitus   . Diverticulitis   . Diverticulosis   . GERD (gastroesophageal reflux disease)   . Grave's disease   . Hyperlipidemia   . Hypertension   . IBS (irritable bowel syndrome)   . Osteopenia   . Thalassemia minor     PAST SURGICAL HISTORY: Past Surgical History:  Procedure Laterality Date  . CHOLECYSTECTOMY    .  HIATAL HERNIA REPAIR    . HYSTEROSCOPY     fibroids  . laparoscopy     fibroids    SOCIAL HISTORY: Social History   Tobacco Use  . Smoking status: Never Smoker  . Smokeless tobacco: Never Used  Substance Use Topics  . Alcohol use: No    Alcohol/week: 0.0 oz  . Drug use: No    FAMILY HISTORY: Family History  Adopted: Yes  Problem Relation Age of Onset  . Healthy Son        x 2  . Headache Unknown        Cluster headaches  . Colon cancer Neg Hx   . Pancreatic cancer Neg Hx   . Rectal cancer Neg Hx   . Stomach cancer Neg Hx     ROS: Review of Systems  Constitutional: Positive for weight loss. Negative for malaise/fatigue.  HENT:       Positive metallic taste in mouth  Respiratory: Negative for  shortness of breath.   Cardiovascular: Negative for chest pain.  Neurological: Negative for dizziness and weakness.    PHYSICAL EXAM: Blood pressure 119/65, pulse 61, temperature 98 F (36.7 C), height 5' 5"  (1.651 m), weight 192 lb (87.1 kg), SpO2 96 %. Body mass index is 31.95 kg/m. Physical Exam  Constitutional: She is oriented to person, place, and time. She appears well-developed and well-nourished.  Cardiovascular: Normal rate.  Pulmonary/Chest: Effort normal.  Musculoskeletal: Normal range of motion.  Neurological: She is alert and oriented to person, place, and time.  Skin: Skin is warm and dry.  Psychiatric: She has a normal mood and affect.    RECENT LABS AND TESTS: BMET    Component Value Date/Time   NA 141 02/26/2017 1116   K 4.2 02/26/2017 1116   CL 103 02/26/2017 1116   CO2 24 02/26/2017 1116   GLUCOSE 142 (H) 02/26/2017 1116   GLUCOSE 148 (H) 07/19/2016 1453   GLUCOSE 135 09/08/2008   BUN 22 02/26/2017 1116   CREATININE 0.84 02/26/2017 1116   CALCIUM 9.6 02/26/2017 1116   GFRNONAA 69 02/26/2017 1116   GFRAA 80 02/26/2017 1116   Lab Results  Component Value Date   HGBA1C 6.1 02/08/2017   HGBA1C 7.8 (H) 11/21/2016   HGBA1C 7.0 09/27/2016   HGBA1C 7.5 07/05/2016   HGBA1C 9.5 05/08/2016   Lab Results  Component Value Date   INSULIN 76.3 (H) 11/21/2016   CBC    Component Value Date/Time   WBC 7.1 02/26/2017 1054   WBC 8.2 07/19/2016 1453   RBC 5.42 (H) 02/26/2017 1054   RBC 5.77 (H) 07/19/2016 1453   HGB 10.8 (L) 02/26/2017 1054   HCT 33.3 (L) 02/26/2017 1054   PLT 270 11/21/2016 1113   MCV 61 (L) 02/26/2017 1054   MCH 19.9 (L) 02/26/2017 1054   MCH 19.8 (L) 02/17/2014 1705   MCHC 32.4 02/26/2017 1054   MCHC 31.6 07/19/2016 1453   RDW 15.9 (H) 02/26/2017 1054   LYMPHSABS 1.2 02/26/2017 1054   MONOABS 0.7 07/19/2016 1453   EOSABS 0.1 02/26/2017 1054   BASOSABS 0.0 02/26/2017 1054   Iron/TIBC/Ferritin/ %Sat    Component Value Date/Time     IRON 137 05/29/2016 1116   FERRITIN 106.1 05/29/2016 1116   IRONPCTSAT 38.4 05/29/2016 1116   Lipid Panel     Component Value Date/Time   CHOL 156 11/21/2016 1113   TRIG 104 11/21/2016 1113   HDL 44 11/21/2016 1113   CHOLHDL 4 04/13/2016 0804   VLDL  20.8 04/13/2016 0804   LDLCALC 91 11/21/2016 1113   LDLDIRECT 98.0 05/24/2014 1343   Hepatic Function Panel     Component Value Date/Time   PROT 6.9 02/26/2017 1116   ALBUMIN 3.9 02/26/2017 1116   AST 34 02/26/2017 1116   ALT 33 (H) 02/26/2017 1116   ALKPHOS 71 02/26/2017 1116   BILITOT 0.7 02/26/2017 1116   BILIDIR 0.2 04/13/2016 0804   IBILI 0.5 02/17/2014 1705      Component Value Date/Time   TSH 5.170 (H) 02/26/2017 1116   TSH 2.560 11/21/2016 1113   TSH 1.12 07/19/2016 1453    ASSESSMENT AND PLAN: Microcytic anemia  Class 1 obesity with serious comorbidity and body mass index (BMI) of 32.0 to 32.9 in adult, unspecified obesity type  PLAN:  Microcytic Anemia Breanna Webster is educated about risk of anemia. She is educated about complications of anemia that can be life threatening, not limited to but including MI and death. She declines referral to Hematologist today.   We spent > than 50% of the 15 minute visit on the counseling as documented in the note.  Obesity Breanna Webster is currently in the action stage of change. As such, her goal is to continue with weight loss efforts She has agreed to follow the Category 3 plan Breanna Webster has been instructed to work up to a goal of 150 minutes of combined cardio and strengthening exercise per week for weight loss and overall health benefits. We discussed the following Behavioral Modification Strategies today: increasing lean protein intake and holiday eating strategies    Breanna Webster has agreed to follow up with our clinic in 2 weeks. She was informed of the importance of frequent follow up visits to maximize her success with intensive lifestyle modifications for her multiple health  conditions.  I, Trixie Dredge, am acting as transcriptionist for Lacy Duverney, PA-C  I have reviewed the above documentation for accuracy and completeness, and I agree with the above. -Lacy Duverney, PA-C  I have reviewed the above note and agree with the plan. -Dennard Nip, MD     Today's visit was # 8 out of 22.  Starting weight: 198 lbs Starting date: 11/21/16 Today's weight : 192 lbs  Today's date: 03/12/2017 Total lbs lost to date: 6 (Patients must lose 7 lbs in the first 6 months to continue with counseling)   ASK: We discussed the diagnosis of obesity with Breanna Webster today and Breanna Webster agreed to give [name] permission to discuss obesity behavioral modification therapy today.  ASSESS: Breanna Webster has the diagnosis of obesity and her BMI today is 31.95 Breanna Webster is in the action stage of change   ADVISE: Breanna Webster was educated on the multiple health risks of obesity as well as the benefit of weight loss to improve her health. She was advised of the need for long term treatment and the importance of lifestyle modifications.  AGREE: Multiple dietary modification options and treatment options were discussed and  Breanna Webster agreed to follow the Category 3 plan We discussed the following Behavioral Modification Strategies today: increasing lean protein intake and holiday eating strategies

## 2017-03-20 ENCOUNTER — Ambulatory Visit (AMBULATORY_SURGERY_CENTER): Payer: Medicare Other | Admitting: Gastroenterology

## 2017-03-20 ENCOUNTER — Encounter: Payer: Self-pay | Admitting: Gastroenterology

## 2017-03-20 ENCOUNTER — Other Ambulatory Visit: Payer: Self-pay

## 2017-03-20 VITALS — BP 120/61 | HR 62 | Temp 97.3°F | Resp 18 | Ht 65.0 in | Wt 194.0 lb

## 2017-03-20 DIAGNOSIS — K2970 Gastritis, unspecified, without bleeding: Secondary | ICD-10-CM

## 2017-03-20 DIAGNOSIS — D120 Benign neoplasm of cecum: Secondary | ICD-10-CM | POA: Diagnosis not present

## 2017-03-20 DIAGNOSIS — D124 Benign neoplasm of descending colon: Secondary | ICD-10-CM | POA: Diagnosis not present

## 2017-03-20 DIAGNOSIS — D122 Benign neoplasm of ascending colon: Secondary | ICD-10-CM

## 2017-03-20 DIAGNOSIS — D123 Benign neoplasm of transverse colon: Secondary | ICD-10-CM

## 2017-03-20 DIAGNOSIS — K219 Gastro-esophageal reflux disease without esophagitis: Secondary | ICD-10-CM

## 2017-03-20 DIAGNOSIS — K2990 Gastroduodenitis, unspecified, without bleeding: Secondary | ICD-10-CM

## 2017-03-20 DIAGNOSIS — D125 Benign neoplasm of sigmoid colon: Secondary | ICD-10-CM | POA: Diagnosis not present

## 2017-03-20 DIAGNOSIS — K635 Polyp of colon: Secondary | ICD-10-CM

## 2017-03-20 DIAGNOSIS — Z860100 Personal history of colon polyps, unspecified: Secondary | ICD-10-CM

## 2017-03-20 DIAGNOSIS — I10 Essential (primary) hypertension: Secondary | ICD-10-CM | POA: Diagnosis not present

## 2017-03-20 DIAGNOSIS — K2950 Unspecified chronic gastritis without bleeding: Secondary | ICD-10-CM | POA: Diagnosis not present

## 2017-03-20 DIAGNOSIS — E119 Type 2 diabetes mellitus without complications: Secondary | ICD-10-CM | POA: Diagnosis not present

## 2017-03-20 DIAGNOSIS — D127 Benign neoplasm of rectosigmoid junction: Secondary | ICD-10-CM | POA: Diagnosis not present

## 2017-03-20 DIAGNOSIS — K228 Other specified diseases of esophagus: Secondary | ICD-10-CM | POA: Diagnosis not present

## 2017-03-20 MED ORDER — SODIUM CHLORIDE 0.9 % IV SOLN: 500.0000 mL | INTRAVENOUS | Status: DC

## 2017-03-20 NOTE — Op Note (Addendum)
[place] Patient Name: Jalayla Chrismer Procedure Date: 03/20/2017 1:37 PM MRN: [id number] Endoscopist: Remo Lipps P. Keinan Brouillet MD, MD Age: 71 Referring MD:  Date of Birth: [birth year] Gender: Female Account #: [id number] Procedure:                Colonoscopy Indications:              High risk colon cancer surveillance: Personal                            history of colonic polyps Medicines:                Monitored Anesthesia Care Procedure:                Pre-Anesthesia Assessment:                           - Prior to the procedure, a History and Physical                            was performed, and patient medications and                            allergies were reviewed. The patient's tolerance of                            previous anesthesia was also reviewed. The risks                            and benefits of the procedure and the sedation                            options and risks were discussed with the patient.                            All questions were answered, and informed consent                            was obtained. Prior Anticoagulants: The patient has                            taken no previous anticoagulant or antiplatelet                            agents. ASA Grade Assessment: II - A patient with                            mild systemic disease. After reviewing the risks                            and benefits, the patient was deemed in                            satisfactory condition to undergo the procedure.  After obtaining informed consent, the colonoscope                            was passed under direct vision. Throughout the                            procedure, the patient's blood pressure, pulse, and                            oxygen saturations were monitored continuously. The                            Colonoscope was introduced through the anus and                            advanced to the the cecum,  identified by                            appendiceal orifice and ileocecal valve. The                            colonoscopy was performed without difficulty. The                            patient tolerated the procedure well. The quality                            of the bowel preparation was good. The ileocecal                            valve, appendiceal orifice, and rectum were                            photographed. Scope In: 1:52:08 PM Scope Out: 3:97:67 PM Scope Withdrawal Time: 0 hours 39 minutes 7 seconds  Total Procedure Duration: 0 hours 54 minutes 41 seconds  Findings:                 The perianal and digital rectal examinations were                            normal.                           Multiple small and large-mouthed diverticula were                            found in the entire colon.                           A 3 mm polyp was found in the cecum. The polyp was                            sessile. The polyp was removed with a cold biopsy  forceps. Resection and retrieval were complete.                           Four sessile polyps were found in the ascending                            colon. The polyps were 4 to 5 mm in size. These                            polyps were removed with a cold snare. Resection                            and retrieval were complete.                           Three sessile polyps were found in the hepatic                            flexure. The polyps were 4 to 5 mm in size. These                            polyps were removed with a cold snare. Resection                            and retrieval were complete.                           15 sessile polyps were found in the transverse                            colon. The polyps were 4 to 8 mm in size. These                            polyps were removed with a cold snare. Resection                            and retrieval were complete.                            Three sessile polyps were found in the descending                            colon. The polyps were 4 to 5 mm in size. These                            polyps were removed with a cold snare. Resection                            and retrieval were complete.                           A 3 mm polyp was found in the sigmoid colon. The  polyp was sessile. The polyp was removed with a                            cold snare. Resection and retrieval were complete.                           A 4 mm polyp was found in the recto-sigmoid colon.                            The polyp was sessile. The polyp was removed with a                            cold snare. Resection and retrieval were complete.                           he exam was otherwise without abnormality on direct                            and retroflexion views. Of note, the IC valve was                            normal but photo was not taken. Complications:            No immediate complications. Estimated blood loss:                            Minimal. Estimated Blood Loss:     Estimated blood loss was minimal. Impression:               - Diverticulosis in the entire examined colon.                           - One 3 mm polyp in the cecum, removed with a cold                            biopsy forceps. Resected and retrieved.                           - Four 4 to 5 mm polyps in the ascending colon,                            removed with a cold snare. Resected and retrieved.                           - Three 4 to 5 mm polyps at the hepatic flexure,                            removed with a cold snare. Resected and retrieved.                           - 15 4 to 8 mm polyps in the transverse colon,  removed with a cold snare. Resected and retrieved.                           - Three 4 to 5 mm polyps in the descending colon,                            removed with a cold snare. Resected and  retrieved.                           - One 3 mm polyp in the sigmoid colon, removed with                            a cold snare. Resected and retrieved.                           - One 4 mm polyp at the recto-sigmoid colon,                            removed with a cold snare. Resected and retrieved.                           - The examination was otherwise normal on direct                            and retroflexion views. Recommendation:           - Patient has a contact number available for                            emergencies. The signs and sympT- Patient has a                            contact number available for emergencies. The signs                            and symptoms of potential delayed complications                            were discussed with the patient. Return to normal                            activities tomorrow. Written discharge instructions                            were provided to the patient.                           - Resume previous diet.                           - Continue present medications.                           - Await pathology results.                           -  Repeat colonoscopy is recommended for                            surveillance. The colonoscopy date will be                            determined after pathology results from today's                            exam become available for review.                           - No ibuprofen, naproxen, or other non-steroidal                            anti-inflammatory drugs for 2 weeks after polyp                            removal. Remo Lipps P. Rylie Knierim MD, MD 03/20/2017 3:00:14 PM This report has been signed electronically.

## 2017-03-20 NOTE — Op Note (Addendum)
[place] Patient Name: Breanna Webster Procedure Date: 03/20/2017 1:37 PM MRN: [id number] Endoscopist: Remo Lipps P. Burgandy Hackworth MD, MD Age: 71 Referring MD:  Date of Birth: 1945-05-30 Gender: Female Account #: [id number] Procedure:                Upper GI endoscopy Indications:              Heartburn, history of Nissen, using prilosec as                            needed previously Medicines:                Monitored Anesthesia Care Procedure:                Pre-Anesthesia Assessment:                           - Prior to the procedure, a History and Physical                            was performed, and patient medications and                            allergies were reviewed. The patient's tolerance of                            previous anesthesia was also reviewed. The risks                            and benefits of the procedure and the sedation                            options and risks were discussed with the patient.                            All questions were answered, and informed consent                            was obtained. Prior Anticoagulants: The patient has                            taken no previous anticoagulant or antiplatelet                            agents. ASA Grade Assessment: II - A patient with                            mild systemic disease. After reviewing the risks                            and benefits, the patient was deemed in                            satisfactory condition to undergo the procedure.  After obtaining informed consent, the endoscope was                            passed under direct vision. Throughout the                            procedure, the patient's blood pressure, pulse, and                            oxygen saturations were monitored continuously. The                            Model GIF-HQ190 [id number]) scope was introduced                            through the mouth, and  advanced to the second part                            of duodenum. The upper GI endoscopy was                            accomplished without difficulty. The patient                            tolerated the procedure well. Scope In: Scope Out: Findings:                 The Z-line was irregular and was found 35 cm from                            the incisors. Biopsies were taken with a cold                            forceps for histology.                           LA Grade B (one or more mucosal breaks greater than                            5 mm, not extending between the tops of two mucosal                            folds) esophagitis was found.                           The exam of the esophagus was otherwise normal.                           Evidence of a Nissen fundoplication was found in                            the cardia. The wrap appeared loose. A small  paraesophageal hernia was seen on retroflexion.                           Patchy mildly erythematous mucosa was found in the                            gastric antrum.                           The exam of the stomach was otherwise normal.                           Biopsies were taken with a cold forceps in the                            gastric body, at the incisura and in the gastric                            antrum for Helicobacter pylori testing.                           Multiple diffuse erosions were found in the                            duodenal bulb.                           The exam of the duodenum was otherwise normal. Complications:            No immediate complications. Estimated blood loss:                            Minimal. Estimated Blood Loss:     Estimated blood loss was minimal. Impression:               - Z-line irregular, 35 cm from the incisors.                            Biopsied.                           - LA Grade B reflux esophagitis.                           - A  Nissen fundoplication was found. The wrap                            appears loose.                           - Small paraesophageal hernia.                           - Duodenal erosions.                           - Biopsies were taken with a cold forceps  for                            Helicobacter pylori testing. Recommendation:           - Patient has a contact number available for                            emergencies. The signs and symptoms of potential                            delayed complications were discussed with the                            patient. Return to normal activities tomorrow.                            Written discharge instructions were provided to the                            patient.                           - Resume previous diet.                           - Continue present medications.                           - Use nexium 9m every day                           - Await pathology results. SRemo LippsP. Stepan Verrette MD, MD 03/20/2017 3:04:40 PM This report has been signed electronically.

## 2017-03-20 NOTE — Progress Notes (Signed)
Report to PACU, RN, vss, BBS= Clear.  

## 2017-03-20 NOTE — Patient Instructions (Addendum)
** Handouts given on polyps and diverticulosis** No NSAIDS (ibuprofen, aleve, naproxen) for 2 weeks! Tylenol is okay to take!   YOU HAD AN ENDOSCOPIC PROCEDURE TODAY AT [facility]:   Refer to the procedure report that was given to you for any specific questions about what was found during the examination.  If the procedure report does not answer your questions, please call your gastroenterologist to clarify.  If you requested that your care partner not be given the details of your procedure findings, then the procedure report has been included in a sealed envelope for you to review at your convenience later.  YOU SHOULD EXPECT: Some feelings of bloating in the abdomen. Passage of more gas than usual.  Walking can help get rid of the air that was put into your GI tract during the procedure and reduce the bloating. If you had a lower endoscopy (such as a colonoscopy or flexible sigmoidoscopy) you may notice spotting of blood in your stool or on the toilet paper. If you underwent a bowel prep for your procedure, you may not have a normal bowel movement for a few days.  Please Note:  You might notice some irritation and congestion in your nose or some drainage.  This is from the oxygen used during your procedure.  There is no need for concern and it should clear up in a day or so.  SYMPTOMS TO REPORT IMMEDIATELY:   Following lower endoscopy (colonoscopy or flexible sigmoidoscopy):  Excessive amounts of blood in the stool  Significant tenderness or worsening of abdominal pains  Swelling of the abdomen that is new, acute  Fever of 100F or higher   Following upper endoscopy (EGD)  Vomiting of blood or coffee ground material  New chest pain or pain under the shoulder blades  Painful or persistently difficult swallowing  New shortness of breath  Fever of 100F or higher  Black, tarry-looking stools  For urgent or emergent issues, a gastroenterologist can be reached at any hour by  calling [phone].   DIET:  We do recommend a small meal at first, but then you may proceed to your regular diet.  Drink plenty of fluids but you should avoid alcoholic beverages for 24 hours.  ACTIVITY:  You should plan to take it easy for the rest of today and you should NOT DRIVE or use heavy machinery until tomorrow (because of the sedation medicines used during the test).    FOLLOW UP: Our staff will call the number listed on your records the next business day following your procedure to check on you and address any questions or concerns that you may have regarding the information given to you following your procedure. If we do not reach you, we will leave a message.  However, if you are feeling well and you are not experiencing any problems, there is no need to return our call.  We will assume that you have returned to your regular daily activities without incident.  If any biopsies were taken you will be contacted by phone or by letter within the next 1-3 weeks.  Please call us at [phone] if you have not heard about the biopsies in 3 weeks.    SIGNATURES/CONFIDENTIALITY: You and/or your care partner have signed paperwork which will be entered into your electronic medical record.  These signatures attest to the fact that that the information above on your After Visit Summary has been reviewed and is understood.  Full responsibility of the confidentiality  of this discharge information lies with you and/or your care-partner.

## 2017-03-20 NOTE — Progress Notes (Signed)
Called to room to assist during endoscopic procedure.  Patient ID and intended procedure confirmed with present staff. Received instructions for my participation in the procedure from the performing physician.  

## 2017-03-21 ENCOUNTER — Telehealth: Payer: Self-pay

## 2017-03-21 NOTE — Telephone Encounter (Signed)
  Follow up Call-  Call Breanna Webster number 03/20/2017  Post procedure Call Breanna Webster phone  # [phone]  Permission to leave phone message Yes  Some recent data might be hidden     Patient questions:  Do you have a fever, pain , or abdominal swelling? No. Pain Score  0 *  Have you tolerated food without any problems? Yes.    Have you been able to return to your normal activities? Yes.    Do you have any questions about your discharge instructions: Diet   No. Medications  No. Follow up visit  No.  Do you have questions or concerns about your Care? No.  Actions: * If pain score is 4 or above: No action needed, pain <4.

## 2017-03-27 ENCOUNTER — Ambulatory Visit (INDEPENDENT_AMBULATORY_CARE_PROVIDER_SITE_OTHER): Payer: Medicare Other | Admitting: Physician Assistant

## 2017-03-28 ENCOUNTER — Other Ambulatory Visit: Payer: Self-pay

## 2017-03-28 DIAGNOSIS — D126 Benign neoplasm of colon, unspecified: Secondary | ICD-10-CM

## 2017-03-29 ENCOUNTER — Other Ambulatory Visit: Payer: Self-pay

## 2017-03-29 MED ORDER — DEXLANSOPRAZOLE 60 MG PO CPDR: 60.0000 mg | DELAYED_RELEASE_CAPSULE | Freq: Every day | ORAL | 3 refills | Status: DC

## 2017-04-02 ENCOUNTER — Ambulatory Visit (INDEPENDENT_AMBULATORY_CARE_PROVIDER_SITE_OTHER): Payer: Medicare Other | Admitting: Physician Assistant

## 2017-04-03 DIAGNOSIS — M545 Low back pain: Secondary | ICD-10-CM | POA: Diagnosis not present

## 2017-04-03 DIAGNOSIS — M48061 Spinal stenosis, lumbar region without neurogenic claudication: Secondary | ICD-10-CM | POA: Diagnosis not present

## 2017-04-03 DIAGNOSIS — M47817 Spondylosis without myelopathy or radiculopathy, lumbosacral region: Secondary | ICD-10-CM | POA: Diagnosis not present

## 2017-04-05 ENCOUNTER — Encounter: Payer: Self-pay | Admitting: Family Medicine

## 2017-04-05 ENCOUNTER — Ambulatory Visit (INDEPENDENT_AMBULATORY_CARE_PROVIDER_SITE_OTHER): Payer: Medicare Other | Admitting: Family Medicine

## 2017-04-05 ENCOUNTER — Telehealth: Payer: Self-pay | Admitting: Genetic Counselor

## 2017-04-05 VITALS — BP 102/58 | HR 73 | Temp 98.4°F | Wt 195.0 lb

## 2017-04-05 DIAGNOSIS — J069 Acute upper respiratory infection, unspecified: Secondary | ICD-10-CM

## 2017-04-05 DIAGNOSIS — B9789 Other viral agents as the cause of diseases classified elsewhere: Secondary | ICD-10-CM

## 2017-04-05 MED ORDER — HYDROCODONE-HOMATROPINE 5-1.5 MG/5ML PO SYRP: 5.0000 mL | ORAL_SOLUTION | Freq: Four times a day (QID) | ORAL | 0 refills | Status: AC | PRN

## 2017-04-05 NOTE — Progress Notes (Signed)
Subjective:     Patient ID: Breanna Webster, female   DOB: June 02, 1945, 70 y.o.   MRN: [id number]  HPI Patient seen for acute visit. Three-day history of sore throat, body aches, malaise, nasal congestion, cough. Cough has been productive of green to brown mucus. Denies any nausea, vomiting, or diarrhea. No sick contacts. Denies any dyspnea. No chest pains. Taken Tylenol with temporary relief. Sore throat has been fairly severe at times.  Past Medical History:  Diagnosis Date  . Arthritis   . Carotid artery stenosis    Mild  . Colon polyps   . Diabetes mellitus   . Diverticulitis   . Diverticulosis   . GERD (gastroesophageal reflux disease)   . Grave's disease   . Heart murmur    mitral prolapse  . Hyperlipidemia   . Hypertension   . IBS (irritable bowel syndrome)   . Osteopenia   . Thalassemia minor    Past Surgical History:  Procedure Laterality Date  . CHOLECYSTECTOMY    . HIATAL HERNIA REPAIR    . HYSTEROSCOPY     fibroids  . laparoscopy     fibroids    reports that  has never smoked. she has never used smokeless tobacco. She reports that she does not drink alcohol or use drugs. family history includes Headache in her unknown relative; Healthy in her son. She was adopted. Allergies  Allergen Reactions  . Statins Other (See Comments)    Muscle aches     Review of Systems  Constitutional: Negative for chills and fever.  HENT: Positive for congestion and sore throat.   Respiratory: Positive for cough.        Objective:   Physical Exam  Constitutional: She appears well-developed and well-nourished.  HENT:  Right Ear: External ear normal.  Left Ear: External ear normal.  Mouth/Throat: Oropharynx is clear and moist.  Neck: Neck supple.  Cardiovascular: Normal rate and regular rhythm.  Pulmonary/Chest: Effort normal and breath sounds normal. No respiratory distress. She has no wheezes. She has no rales.  Lymphadenopathy:    She has no cervical adenopathy.        Assessment:     Upper respiratory infection. Suspect viral.    Plan:     -Continue Tylenol as needed for body aches -Push fluids -Hycodan cough syrup 1 teaspoon daily at bedtime when necessary for severe cough -Follow-up probably for any fever or worsening symptoms  Eulas Post MD [facility]

## 2017-04-05 NOTE — Telephone Encounter (Signed)
04/05/17 called patient @ 2:26 pm and left message to call back to confirm appointment with Roma Kayser on 05/22/17 @ 10:0 am

## 2017-04-05 NOTE — Patient Instructions (Signed)

## 2017-04-08 ENCOUNTER — Ambulatory Visit
Admission: RE | Admit: 2017-04-08 | Discharge: 2017-04-08 | Disposition: A | Discharge status: Home / Self Care | Payer: Medicare Other | Source: Ambulatory Visit | Attending: Obstetrics | Admitting: Obstetrics

## 2017-04-08 DIAGNOSIS — Z780 Asymptomatic menopausal state: Secondary | ICD-10-CM | POA: Diagnosis not present

## 2017-04-08 DIAGNOSIS — M8589 Other specified disorders of bone density and structure, multiple sites: Secondary | ICD-10-CM | POA: Diagnosis not present

## 2017-04-08 DIAGNOSIS — M8580 Other specified disorders of bone density and structure, unspecified site: Secondary | ICD-10-CM

## 2017-04-09 ENCOUNTER — Ambulatory Visit: Payer: Medicare Other | Admitting: Gastroenterology

## 2017-04-24 ENCOUNTER — Ambulatory Visit (INDEPENDENT_AMBULATORY_CARE_PROVIDER_SITE_OTHER): Payer: Medicare Other | Admitting: Physician Assistant

## 2017-04-24 VITALS — BP 108/67 | HR 68 | Temp 98.5°F | Ht 65.0 in | Wt 189.0 lb

## 2017-04-24 DIAGNOSIS — F3289 Other specified depressive episodes: Secondary | ICD-10-CM | POA: Diagnosis not present

## 2017-04-24 DIAGNOSIS — Z6831 Body mass index (BMI) 31.0-31.9, adult: Secondary | ICD-10-CM

## 2017-04-24 DIAGNOSIS — E119 Type 2 diabetes mellitus without complications: Secondary | ICD-10-CM

## 2017-04-24 DIAGNOSIS — E669 Obesity, unspecified: Secondary | ICD-10-CM

## 2017-04-24 MED ORDER — BUPROPION HCL ER (SR) 150 MG PO TB12: 150.0000 mg | ORAL_TABLET | Freq: Every day | ORAL | 0 refills | Status: DC

## 2017-04-24 NOTE — Progress Notes (Signed)
Office: [phone]  /  Fax: [phone]   HPI:   Chief Complaint: OBESITY Breanna Webster is here to discuss her progress with her obesity treatment plan. She is on the Category 3 plan and is following her eating plan approximately 65 % of the time. She states she is exercising 0 minutes 0 times per week. [name] continues to do well with weight loss. She is mindful of her eating but has noticed increase with cravings and wants to discuss medicines to help.  Her weight is 189 lb (85.7 kg) today and has had a weight loss of 3 pounds over a period of 6 weeks since her last visit. She has lost 9 lbs since starting treatment with [name].  Diabetes II [name] has a diagnosis of diabetes type II. [name] states fasting BGs range between 100's and 170's and she states the 170's is related to eating a lot of calories and carbohydrates at dinner. She denies any hypoglycemic episodes. Last A1c was 6.1 on 02/08/17. She has been working on intensive lifestyle modifications including diet, exercise, and weight loss to help control her blood glucose levels.  Depression with emotional eating behaviors [name] is struggling with emotional eating and using food for comfort to the extent that it is negatively impacting her health. She often snacks when she is not hungry. [name] sometimes feels she is out of control and then feels guilty that she made poor food choices. She has been working on behavior modification techniques to help reduce her emotional eating and has been somewhat successful. Her mood is stable but noticed increase with cravings. She shows no sign of suicidal or homicidal ideations.  Depression screen [facility] 2/9 11/21/2016 05/23/2016 04/02/2016 03/29/2015 03/29/2015  Decreased Interest 3 (No Data) 0 0 0  Down, Depressed, Hopeless 1 - 0 0 0  PHQ - 2 Score 4 - 0 0 0  Altered sleeping 0 - - - -  Tired, decreased energy 2 - - - -  Change in appetite 1 - - - -  Feeling bad or failure about yourself  1 - - - -  Trouble  concentrating 0 - - - -  Moving slowly or fidgety/restless 0 - - - -  Suicidal thoughts 0 - - - -  PHQ-9 Score 8 - - - -  Some recent data might be hidden   ALLERGIES: Allergies  Allergen Reactions  . Statins Other (See Comments)    Muscle aches    MEDICATIONS: Current Outpatient Medications on File Prior to Visit  Medication Sig Dispense Refill  . aspirin 81 MG tablet Take 81 mg by mouth daily.      . bisoprolol (ZEBETA) 5 MG tablet Take 1 tablet (5 mg total) by mouth daily. 90 tablet 3  . dexlansoprazole (DEXILANT) 60 MG capsule Take 1 capsule (60 mg total) by mouth daily. 30 capsule 3  . escitalopram (LEXAPRO) 20 MG tablet take 1 tablet by mouth once daily 90 tablet 1  . glucose blood (ONETOUCH VERIO) test strip Use to check blood sugar 2 times per day. Dx Code: E11.8 200 each 2  . hydrochlorothiazide (HYDRODIURIL) 12.5 MG tablet Take 1 tablet (12.5 mg total) by mouth daily. 90 tablet 3  . insulin NPH Human (NOVOLIN N RELION) 100 UNIT/ML injection Inject 0.8 mLs (80 Units total) into the skin every morning. 30 mL 2  . Insulin Syringe-Needle U-100 (INSULIN SYRINGE .5CC/30GX1/2") 30G X 1/2" 0.5 ML MISC Use to inject insulin 2 times per day. 100 each 2  .  levothyroxine (SYNTHROID, LEVOTHROID) 175 MCG tablet Take 1 tablet (175 mcg total) daily before breakfast by mouth. 90 tablet 2  . losartan (COZAAR) 50 MG tablet take 1 tablet by mouth once daily 90 tablet 1  . Multiple Vitamin (MULITIVITAMIN WITH MINERALS) TABS Take 1 tablet by mouth daily.    . nortriptyline (PAMELOR) 25 MG capsule Take 1 capsule (25 mg total) by mouth at bedtime. 90 capsule 3  . ONETOUCH DELICA LANCETS 45G MISC Use to check blood sugar 2 times per day. Dx Code: E11.8 200 each 1   No current facility-administered medications on file prior to visit.     PAST MEDICAL HISTORY: Past Medical History:  Diagnosis Date  . Arthritis   . Carotid artery stenosis    Mild  . Colon polyps   . Diabetes mellitus   .  Diverticulitis   . Diverticulosis   . GERD (gastroesophageal reflux disease)   . Grave's disease   . Heart murmur    mitral prolapse  . Hyperlipidemia   . Hypertension   . IBS (irritable bowel syndrome)   . Osteopenia   . Thalassemia minor     PAST SURGICAL HISTORY: Past Surgical History:  Procedure Laterality Date  . CHOLECYSTECTOMY    . HIATAL HERNIA REPAIR    . HYSTEROSCOPY     fibroids  . laparoscopy     fibroids    SOCIAL HISTORY: Social History   Tobacco Use  . Smoking status: Never Smoker  . Smokeless tobacco: Never Used  Substance Use Topics  . Alcohol use: No    Alcohol/week: 0.0 oz  . Drug use: No    FAMILY HISTORY: Family History  Adopted: Yes  Problem Relation Age of Onset  . Healthy Son        x 2  . Headache Unknown        Cluster headaches  . Colon cancer Neg Hx   . Pancreatic cancer Neg Hx   . Rectal cancer Neg Hx   . Stomach cancer Neg Hx     ROS: Review of Systems  Constitutional: Positive for weight loss.  Endo/Heme/Allergies:       Negative hypoglycemia  Psychiatric/Behavioral: Positive for depression. Negative for suicidal ideas.    PHYSICAL EXAM: Blood pressure 108/67, pulse 68, temperature 98.5 F (36.9 C), temperature source Oral, height 5' 5"  (1.651 m), weight 189 lb (85.7 kg), SpO2 97 %. Body mass index is 31.45 kg/m. Physical Exam  Constitutional: She is oriented to person, place, and time. She appears well-developed and well-nourished.  Cardiovascular: Normal rate.  Pulmonary/Chest: Effort normal.  Musculoskeletal: Normal range of motion.  Neurological: She is oriented to person, place, and time.  Skin: Skin is warm and dry.  Psychiatric: She has a normal mood and affect.  Vitals reviewed.   RECENT LABS AND TESTS: BMET    Component Value Date/Time   NA 141 02/26/2017 1116   K 4.2 02/26/2017 1116   CL 103 02/26/2017 1116   CO2 24 02/26/2017 1116   GLUCOSE 142 (H) 02/26/2017 1116   GLUCOSE 148 (H) 07/19/2016  1453   GLUCOSE 135 09/08/2008   BUN 22 02/26/2017 1116   CREATININE 0.84 02/26/2017 1116   CALCIUM 9.6 02/26/2017 1116   GFRNONAA 69 02/26/2017 1116   GFRAA 80 02/26/2017 1116   Lab Results  Component Value Date   HGBA1C 6.1 02/08/2017   HGBA1C 7.8 (H) 11/21/2016   HGBA1C 7.0 09/27/2016   HGBA1C 7.5 07/05/2016   HGBA1C 9.5 05/08/2016  Lab Results  Component Value Date   INSULIN 76.3 (H) 11/21/2016   CBC    Component Value Date/Time   WBC 7.1 02/26/2017 1054   WBC 8.2 07/19/2016 1453   RBC 5.42 (H) 02/26/2017 1054   RBC 5.77 (H) 07/19/2016 1453   HGB 10.8 (L) 02/26/2017 1054   HCT 33.3 (L) 02/26/2017 1054   PLT 270 11/21/2016 1113   MCV 61 (L) 02/26/2017 1054   MCH 19.9 (L) 02/26/2017 1054   MCH 19.8 (L) 02/17/2014 1705   MCHC 32.4 02/26/2017 1054   MCHC 31.6 07/19/2016 1453   RDW 15.9 (H) 02/26/2017 1054   LYMPHSABS 1.2 02/26/2017 1054   MONOABS 0.7 07/19/2016 1453   EOSABS 0.1 02/26/2017 1054   BASOSABS 0.0 02/26/2017 1054   Iron/TIBC/Ferritin/ %Sat    Component Value Date/Time   IRON 137 05/29/2016 1116   FERRITIN 106.1 05/29/2016 1116   IRONPCTSAT 38.4 05/29/2016 1116   Lipid Panel     Component Value Date/Time   CHOL 156 11/21/2016 1113   TRIG 104 11/21/2016 1113   HDL 44 11/21/2016 1113   CHOLHDL 4 04/13/2016 0804   VLDL 20.8 04/13/2016 0804   LDLCALC 91 11/21/2016 1113   LDLDIRECT 98.0 05/24/2014 1343   Hepatic Function Panel     Component Value Date/Time   PROT 6.9 02/26/2017 1116   ALBUMIN 3.9 02/26/2017 1116   AST 34 02/26/2017 1116   ALT 33 (H) 02/26/2017 1116   ALKPHOS 71 02/26/2017 1116   BILITOT 0.7 02/26/2017 1116   BILIDIR 0.2 04/13/2016 0804   IBILI 0.5 02/17/2014 1705      Component Value Date/Time   TSH 5.170 (H) 02/26/2017 1116   TSH 2.560 11/21/2016 1113   TSH 1.12 07/19/2016 1453    ASSESSMENT AND PLAN: Type 2 diabetes mellitus without complication, without long-term current use of insulin (HCC)  Other depression -  with emotional eating - Plan: buPROPion (WELLBUTRIN SR) 150 MG 12 hr tablet  Class 1 obesity with serious comorbidity and body mass index (BMI) of 31.0 to 31.9 in adult, unspecified obesity type  PLAN:  Diabetes II Yumi has been given extensive diabetes education by myself today including ideal fasting and post-prandial blood glucose readings, individual ideal Hgb A1c goals and hypoglycemia prevention. We discussed the importance of good blood sugar control to decrease the likelihood of diabetic complications such as nephropathy, neuropathy, limb loss, blindness, coronary artery disease, and death. We discussed the importance of intensive lifestyle modification including diet, exercise and weight loss as the first line treatment for diabetes. Jenne agrees to continue her diabetes medications as prescribed and she will follow up with our clinic in 2 weeks.  Depression with Emotional Eating Behaviors We discussed behavior modification techniques today to help Jadie deal with her emotional eating and depression. Adianna agrees to start Wellbutrin SR 150 mg qd #30 with no refills. Izabell agrees to follow up with our clinic in 2 weeks.  Obesity Sumayah is currently in the action stage of change. As such, her goal is to continue with weight loss efforts She has agreed to follow the Category 3 plan Kyndell has been instructed to work up to a goal of 150 minutes of combined cardio and strengthening exercise per week for weight loss and overall health benefits. We discussed the following Behavioral Modification Strategies today: increasing lean protein intake and emotional eating strategies   Marleny has agreed to follow up with our clinic in 2 weeks. She was informed of the importance of  frequent follow up visits to maximize her success with intensive lifestyle modifications for her multiple health conditions.  I, Trixie Dredge, am acting as transcriptionist for Lacy Duverney, PA-C  I have reviewed the above  documentation for accuracy and completeness, and I agree with the above. -Lacy Duverney, PA-C  I have reviewed the above note and agree with the plan. -Dennard Nip, MD     Today's visit was # 9 out of 22.  Starting weight: 198 lbs Starting date: 11/21/16 Today's weight : 189 lbs  Today's date: 04/24/2017 Total lbs lost to date: 9 (Patients must lose 7 lbs in the first 6 months to continue with counseling)   ASK: We discussed the diagnosis of obesity with [name] today and Anginette agreed to give [name] permission to discuss obesity behavioral modification therapy today.  ASSESS: Kaylee has the diagnosis of obesity and her BMI today is 31.45 Dezaree is in the action stage of change    ADVISE: Lizzete was educated on the multiple health risks of obesity as well as the benefit of weight loss to improve her health. She was advised of the need for long term treatment and the importance of lifestyle modifications.  AGREE: Multiple dietary modification options and treatment options were discussed and  Zyionna agreed to follow the Category 3 plan We discussed the following Behavioral Modification Strategies today: increasing lean protein intake and emotional eating strategies

## 2017-04-29 ENCOUNTER — Telehealth: Payer: Self-pay | Admitting: Endocrinology

## 2017-04-29 DIAGNOSIS — M545 Low back pain: Secondary | ICD-10-CM | POA: Diagnosis not present

## 2017-04-29 DIAGNOSIS — M48061 Spinal stenosis, lumbar region without neurogenic claudication: Secondary | ICD-10-CM | POA: Diagnosis not present

## 2017-04-29 DIAGNOSIS — M47817 Spondylosis without myelopathy or radiculopathy, lumbosacral region: Secondary | ICD-10-CM | POA: Diagnosis not present

## 2017-04-29 NOTE — Telephone Encounter (Signed)
Pt needs to know if she continues on the new does or if she needs to wait and come in to have test done to see how it is working  levothyroxine (SYNTHROID, LEVOTHROID) 175 MCG tablet  PLEASE ADVISE Thanks!

## 2017-04-29 NOTE — Telephone Encounter (Signed)
Diabetes f/u ov is due.  Let's recheck then

## 2017-04-29 NOTE — Telephone Encounter (Signed)
Patient made f/u for 1/14.

## 2017-04-30 ENCOUNTER — Telehealth: Payer: Self-pay | Admitting: Gastroenterology

## 2017-04-30 ENCOUNTER — Telehealth: Payer: Self-pay | Admitting: Endocrinology

## 2017-04-30 NOTE — Telephone Encounter (Signed)
Please increase to 120 units qam.  The effect goes away in approx 5 days.  I'll see you next time.

## 2017-04-30 NOTE — Telephone Encounter (Signed)
Pt stated she had 2 steroid shot yesterday Her sugar went up 400 then today it went to 200   Pt would like to know what she should do

## 2017-04-30 NOTE — Telephone Encounter (Signed)
Please advise if dose should be temporarily increased?

## 2017-04-30 NOTE — Telephone Encounter (Signed)
I called and notified patient. She had no further questions at this time.

## 2017-04-30 NOTE — Telephone Encounter (Signed)
Patient recently was switched to Dexilant 60 mg q day, given #30 with 3 refills. See recent EGD report, 03/20/17. Please advise.

## 2017-04-30 NOTE — Telephone Encounter (Signed)
Patient advised that she will need to stay on the lowest dose of PPI that controls her symptoms. She states the dexilant 60 mg is working fine and just renewed her prescription and will discuss changing to 30 mg daily at next visit.

## 2017-04-30 NOTE — Telephone Encounter (Signed)
If the Dexilant is helping at that dose and symptoms are controlled, we can try reducing it to 62m once daily and see if that works. Given she had esophagitis / recurrent hiatal hernia, and evidence of Barrett's, she should be on PPI indefinitely at the lowest dose needed to control symptoms. If Dexilant is not helping at 668m/ day, please let me know. Thanks

## 2017-05-06 ENCOUNTER — Encounter: Payer: Self-pay | Admitting: Endocrinology

## 2017-05-06 ENCOUNTER — Ambulatory Visit (INDEPENDENT_AMBULATORY_CARE_PROVIDER_SITE_OTHER): Payer: Medicare Other | Admitting: Endocrinology

## 2017-05-06 VITALS — BP 122/60 | HR 81 | Wt 193.0 lb

## 2017-05-06 DIAGNOSIS — E119 Type 2 diabetes mellitus without complications: Secondary | ICD-10-CM | POA: Diagnosis not present

## 2017-05-06 DIAGNOSIS — M8580 Other specified disorders of bone density and structure, unspecified site: Secondary | ICD-10-CM | POA: Diagnosis not present

## 2017-05-06 DIAGNOSIS — E038 Other specified hypothyroidism: Secondary | ICD-10-CM | POA: Diagnosis not present

## 2017-05-06 LAB — POCT GLYCOSYLATED HEMOGLOBIN (HGB A1C): Hemoglobin A1C: 6.8

## 2017-05-06 LAB — TSH: TSH: 0.9 u[IU]/mL (ref 0.35–4.50)

## 2017-05-06 MED ORDER — INSULIN NPH (HUMAN) (ISOPHANE) 100 UNIT/ML ~~LOC~~ SUSP: 70.0000 [IU] | SUBCUTANEOUS | 2 refills | Status: DC

## 2017-05-06 NOTE — Progress Notes (Signed)
Subjective:    Patient ID: Breanna Webster, female    DOB: 05-Apr-1946, 72 y.o.   MRN: [id number]  HPI Pt returns for f/u of diabetes mellitus: DM type: Insulin-requiring type 2 Dx'ed: 2003.  Complications: none.   Therapy: insulin since 2017.     GDM: never.   DKA: never.  Severe hypoglycemia: never.  Pancreatitis: never.   Other: she stopped metformin-XR due to nausea and diarrhea; pioglitizone is precluded by edema.  she takes human insulin, due to cost; she declines multiple daily injections.  Interval history: no cbg record, but states cbg's vary from 125-137.  She takes insulin as rx'ed.  pt states she feels well in general.  She recently had 2 steroid injections.  She temporarily increased the insulin, but requirement is now back to baseline.  Past Medical History:  Diagnosis Date  . Arthritis   . Carotid artery stenosis    Mild  . Colon polyps   . Diabetes mellitus   . Diverticulitis   . Diverticulosis   . GERD (gastroesophageal reflux disease)   . Grave's disease   . Heart murmur    mitral prolapse  . Hyperlipidemia   . Hypertension   . IBS (irritable bowel syndrome)   . Osteopenia   . Thalassemia minor     Past Surgical History:  Procedure Laterality Date  . CHOLECYSTECTOMY    . HIATAL HERNIA REPAIR    . HYSTEROSCOPY     fibroids  . laparoscopy     fibroids    Social History   Socioeconomic History  . Marital status: Married    Spouse name: Not on file  . Number of children: 2  . Years of education: Not on file  . Highest education level: Not on file  Social Needs  . Financial resource strain: Not on file  . Food insecurity - worry: Not on file  . Food insecurity - inability: Not on file  . Transportation needs - medical: Not on file  . Transportation needs - non-medical: Not on file  Occupational History  . Occupation: Retired  Tobacco Use  . Smoking status: Never Smoker  . Smokeless tobacco: Never Used  Substance and Sexual Activity  .  Alcohol use: No    Alcohol/week: 0.0 oz  . Drug use: No  . Sexual activity: Not on file  Other Topics Concern  . Not on file  Social History Narrative   Lives with husband in a one story home.     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   Education: college.    Current Outpatient Medications on File Prior to Visit  Medication Sig Dispense Refill  . aspirin 81 MG tablet Take 81 mg by mouth daily.      . bisoprolol (ZEBETA) 5 MG tablet Take 1 tablet (5 mg total) by mouth daily. 90 tablet 3  . buPROPion (WELLBUTRIN SR) 150 MG 12 hr tablet Take 1 tablet (150 mg total) by mouth daily. 30 tablet 0  . dexlansoprazole (DEXILANT) 60 MG capsule Take 1 capsule (60 mg total) by mouth daily. 30 capsule 3  . escitalopram (LEXAPRO) 20 MG tablet take 1 tablet by mouth once daily 90 tablet 1  . glucose blood (ONETOUCH VERIO) test strip Use to check blood sugar 2 times per day. Dx Code: E11.8 200 each 2  . hydrochlorothiazide (HYDRODIURIL) 12.5 MG tablet Take 1 tablet (12.5 mg total) by mouth daily. [place]  tablet 3  . Insulin Syringe-Needle U-100 (INSULIN SYRINGE .5CC/30GX1/2") 30G X 1/2" 0.5 ML MISC Use to inject insulin 2 times per day. 100 each 2  . levothyroxine (SYNTHROID, LEVOTHROID) 175 MCG tablet Take 1 tablet (175 mcg total) daily before breakfast by mouth. 90 tablet 2  . losartan (COZAAR) 50 MG tablet take 1 tablet by mouth once daily 90 tablet 1  . Multiple Vitamin (MULITIVITAMIN WITH MINERALS) TABS Take 1 tablet by mouth daily.    [name] DELICA LANCETS 63Z MISC Use to check blood sugar 2 times per day. Dx Code: E11.8 200 each 1  . nortriptyline (PAMELOR) 25 MG capsule Take 1 capsule (25 mg total) by mouth at bedtime. (Patient not taking: Reported on 05/06/2017) 90 capsule 3   No current facility-administered medications on file prior to visit.     Allergies  Allergen Reactions  . Statins Other (See Comments)    Muscle  aches    Family History  Adopted: Yes  Problem Relation Age of Onset  . Healthy Son        x 2  . Headache Unknown        Cluster headaches  . Colon cancer Neg Hx   . Pancreatic cancer Neg Hx   . Rectal cancer Neg Hx   . Stomach cancer Neg Hx     BP 122/60 (BP Location: Left Arm, Patient Position: Sitting, Cuff Size: Normal)   Pulse 81   Wt 193 lb (87.5 kg)   SpO2 95%   BMI 32.12 kg/m    Review of Systems She has lost 2 more lbs.     Objective:   Physical Exam VITAL SIGNS:  See vs page GENERAL: no distress Pulses: foot pulses are intact bilaterally.   MSK: no deformity of the feet or ankles.  CV: no edema of the legs or ankles Skin:  no ulcer on the feet or ankles.  normal color and temp on the feet and ankles Neuro: sensation is intact to touch on the feet and ankles.       Assessment & Plan:  Insulin-requiring type 2 DM: overcontrolled Back pain: steroid injections are affecting a1c Hypothyroidism: due for recheck Weight loss, intentional: this is reducing insulin requirement  Patient Instructions  Please reduce the insulin to 70 units each morning.   check your blood sugar twice a day.  vary the time of day when you check, between before the 3 meals, and at bedtime.  also check if you have symptoms of your blood sugar being too high or too low.  please keep a record of the readings and bring it to your next appointment here (or you can bring the meter itself).  You can write it on any piece of paper.  please call us sooner if your blood sugar goes below 70, or if you have a lot of readings over 200.   Please come back for a follow-up appointment in 3 months.

## 2017-05-06 NOTE — Patient Instructions (Addendum)
Please reduce the insulin to 70 units each morning.   check your blood sugar twice a day.  vary the time of day when you check, between before the 3 meals, and at bedtime.  also check if you have symptoms of your blood sugar being too high or too low.  please keep a record of the readings and bring it to your next appointment here (or you can bring the meter itself).  You can write it on any piece of paper.  please call us sooner if your blood sugar goes below 70, or if you have a lot of readings over 200.   Please come back for a follow-up appointment in 3 months.

## 2017-05-07 ENCOUNTER — Ambulatory Visit (INDEPENDENT_AMBULATORY_CARE_PROVIDER_SITE_OTHER): Payer: Medicare Other | Admitting: Physician Assistant

## 2017-05-07 VITALS — BP 112/69 | HR 65 | Temp 98.2°F | Ht 65.0 in | Wt 189.0 lb

## 2017-05-07 DIAGNOSIS — Z6831 Body mass index (BMI) 31.0-31.9, adult: Secondary | ICD-10-CM | POA: Diagnosis not present

## 2017-05-07 DIAGNOSIS — E669 Obesity, unspecified: Secondary | ICD-10-CM

## 2017-05-07 DIAGNOSIS — E119 Type 2 diabetes mellitus without complications: Secondary | ICD-10-CM

## 2017-05-07 DIAGNOSIS — Z794 Long term (current) use of insulin: Secondary | ICD-10-CM

## 2017-05-07 DIAGNOSIS — F3289 Other specified depressive episodes: Secondary | ICD-10-CM

## 2017-05-07 NOTE — Progress Notes (Addendum)
Office: [phone]  /  Fax: [phone]   HPI:   Chief Complaint: OBESITY Breanna Webster is here to discuss her progress with her obesity treatment plan. She is on the Category 3 plan and is following her eating plan approximately 75 % of the time. She states she is exercising 0 minutes 0 times per week. [name] maintained her weight. She states she has been sick with upper respiratory infection and has skipped meals on occasions. She is now better and is motivated to get back on track.  Her weight is 189 lb (85.7 kg) today and has not lost weight since her last visit. She has lost 9 lbs since starting treatment with [name].  Diabetes II Breanna Webster has a diagnosis of diabetes type II. Breanna Webster states fasting BGs range between 110's and 195. She states blood sugars increased after steroid injections in the back. Breanna Webster states average fasting BGs range in the 120's. She denies any hypoglycemic episodes. Last A1c was 6.8 on 05/06/17. She has been working on intensive lifestyle modifications including diet, exercise, and weight loss to help control her blood glucose levels.  Depression with emotional eating behaviors Breanna Webster states Wellbutrin has not worked towards craving suppression. Breanna Webster struggles with emotional eating and using food for comfort to the extent that it is negatively impacting her health. She often snacks when she is not hungry. Breanna Webster sometimes feels she is out of control and then feels guilty that she made poor food choices. She has been working on behavior modification techniques to help reduce her emotional eating and has been somewhat successful. She shows no sign of suicidal or homicidal ideations.  Depression screen Breanna Webster 2/9 11/21/2016 05/23/2016 04/02/2016 03/29/2015 03/29/2015  Decreased Interest 3 (No Data) 0 0 0  Down, Depressed, Hopeless 1 - 0 0 0  PHQ - 2 Score 4 - 0 0 0  Altered sleeping 0 - - - -  Tired, decreased energy 2 - - - -  Change in appetite 1 - - - -  Feeling bad or failure about yourself   1 - - - -  Trouble concentrating 0 - - - -  Moving slowly or fidgety/restless 0 - - - -  Suicidal thoughts 0 - - - -  PHQ-9 Score 8 - - - -  Some recent data might be hidden   ALLERGIES: Allergies  Allergen Reactions  . Statins Other (See Comments)    Muscle aches    MEDICATIONS: Current Outpatient Medications on File Prior to Visit  Medication Sig Dispense Refill  . aspirin 81 MG tablet Take 81 mg by mouth daily.      . bisoprolol (ZEBETA) 5 MG tablet Take 1 tablet (5 mg total) by mouth daily. 90 tablet 3  . dexlansoprazole (DEXILANT) 60 MG capsule Take 1 capsule (60 mg total) by mouth daily. 30 capsule 3  . escitalopram (LEXAPRO) 20 MG tablet take 1 tablet by mouth once daily 90 tablet 1  . glucose blood (ONETOUCH VERIO) test strip Use to check blood sugar 2 times per day. Dx Code: E11.8 200 each 2  . hydrochlorothiazide (HYDRODIURIL) 12.5 MG tablet Take 1 tablet (12.5 mg total) by mouth daily. 90 tablet 3  . insulin NPH Human (NOVOLIN N RELION) 100 UNIT/ML injection Inject 0.7 mLs (70 Units total) into the skin every morning. 30 mL 2  . Insulin Syringe-Needle U-100 (INSULIN SYRINGE .5CC/30GX1/2") 30G X 1/2" 0.5 ML MISC Use to inject insulin 2 times per day. 100 each 2  . levothyroxine (SYNTHROID,  LEVOTHROID) 175 MCG tablet Take 1 tablet (175 mcg total) daily before breakfast by mouth. 90 tablet 2  . losartan (COZAAR) 50 MG tablet take 1 tablet by mouth once daily 90 tablet 1  . Multiple Vitamin (MULITIVITAMIN WITH MINERALS) TABS Take 1 tablet by mouth daily.    . nortriptyline (PAMELOR) 25 MG capsule Take 1 capsule (25 mg total) by mouth at bedtime. 90 capsule 3  . ONETOUCH DELICA LANCETS 58I MISC Use to check blood sugar 2 times per day. Dx Code: E11.8 200 each 1   No current facility-administered medications on file prior to visit.     PAST MEDICAL HISTORY: Past Medical History:  Diagnosis Date  . Arthritis   . Carotid artery stenosis    Mild  . Colon polyps   . Diabetes  mellitus   . Diverticulitis   . Diverticulosis   . GERD (gastroesophageal reflux disease)   . Grave's disease   . Heart murmur    mitral prolapse  . Hyperlipidemia   . Hypertension   . IBS (irritable bowel syndrome)   . Osteopenia   . Thalassemia minor     PAST SURGICAL HISTORY: Past Surgical History:  Procedure Laterality Date  . CHOLECYSTECTOMY    . HIATAL HERNIA REPAIR    . HYSTEROSCOPY     fibroids  . laparoscopy     fibroids    SOCIAL HISTORY: Social History   Tobacco Use  . Smoking status: Never Smoker  . Smokeless tobacco: Never Used  Substance Use Topics  . Alcohol use: No    Alcohol/week: 0.0 oz  . Drug use: No    FAMILY HISTORY: Family History  Adopted: Yes  Problem Relation Age of Onset  . Healthy Son        x 2  . Headache Unknown        Cluster headaches  . Colon cancer Neg Hx   . Pancreatic cancer Neg Hx   . Rectal cancer Neg Hx   . Stomach cancer Neg Hx     ROS: Review of Systems  Constitutional: Negative for weight loss.  Endo/Heme/Allergies:       Negative hypoglycemia  Psychiatric/Behavioral: Positive for depression. Negative for suicidal ideas.    PHYSICAL EXAM: Blood pressure 112/69, pulse 65, temperature 98.2 F (36.8 C), temperature source Oral, height 5' 5"  (1.651 m), weight 189 lb (85.7 kg), SpO2 97 %. Body mass index is 31.45 kg/m. Physical Exam  Constitutional: She is oriented to person, place, and time. She appears well-developed and well-nourished.  Cardiovascular: Normal rate.  Pulmonary/Chest: Effort normal.  Musculoskeletal: Normal range of motion.  Neurological: She is oriented to person, place, and time.  Skin: Skin is warm and dry.  Psychiatric: She has a normal mood and affect.  Vitals reviewed.   RECENT LABS AND TESTS: BMET    Component Value Date/Time   NA 141 02/26/2017 1116   K 4.2 02/26/2017 1116   CL 103 02/26/2017 1116   CO2 24 02/26/2017 1116   GLUCOSE 142 (H) 02/26/2017 1116   GLUCOSE 148  (H) 07/19/2016 1453   GLUCOSE 135 09/08/2008   BUN 22 02/26/2017 1116   CREATININE 0.84 02/26/2017 1116   CALCIUM 9.6 02/26/2017 1116   GFRNONAA 69 02/26/2017 1116   GFRAA 80 02/26/2017 1116   Lab Results  Component Value Date   HGBA1C 6.8 05/06/2017   HGBA1C 6.1 02/08/2017   HGBA1C 7.8 (H) 11/21/2016   HGBA1C 7.0 09/27/2016   HGBA1C 7.5 07/05/2016  Lab Results  Component Value Date   INSULIN 76.3 (H) 11/21/2016   CBC    Component Value Date/Time   WBC 7.1 02/26/2017 1054   WBC 8.2 07/19/2016 1453   RBC 5.42 (H) 02/26/2017 1054   RBC 5.77 (H) 07/19/2016 1453   HGB 10.8 (L) 02/26/2017 1054   HCT 33.3 (L) 02/26/2017 1054   PLT 270 11/21/2016 1113   MCV 61 (L) 02/26/2017 1054   MCH 19.9 (L) 02/26/2017 1054   MCH 19.8 (L) 02/17/2014 1705   MCHC 32.4 02/26/2017 1054   MCHC 31.6 07/19/2016 1453   RDW 15.9 (H) 02/26/2017 1054   LYMPHSABS 1.2 02/26/2017 1054   MONOABS 0.7 07/19/2016 1453   EOSABS 0.1 02/26/2017 1054   BASOSABS 0.0 02/26/2017 1054   Iron/TIBC/Ferritin/ %Sat    Component Value Date/Time   IRON 137 05/29/2016 1116   FERRITIN 106.1 05/29/2016 1116   IRONPCTSAT 38.4 05/29/2016 1116   Lipid Panel     Component Value Date/Time   CHOL 156 11/21/2016 1113   TRIG 104 11/21/2016 1113   HDL 44 11/21/2016 1113   CHOLHDL 4 04/13/2016 0804   VLDL 20.8 04/13/2016 0804   LDLCALC 91 11/21/2016 1113   LDLDIRECT 98.0 05/24/2014 1343   Hepatic Function Panel     Component Value Date/Time   PROT 6.9 02/26/2017 1116   ALBUMIN 3.9 02/26/2017 1116   AST 34 02/26/2017 1116   ALT 33 (H) 02/26/2017 1116   ALKPHOS 71 02/26/2017 1116   BILITOT 0.7 02/26/2017 1116   BILIDIR 0.2 04/13/2016 0804   IBILI 0.5 02/17/2014 1705      Component Value Date/Time   TSH 0.90 05/06/2017 1350   TSH 5.170 (H) 02/26/2017 1116   TSH 2.560 11/21/2016 1113    ASSESSMENT AND PLAN: Type 2 diabetes mellitus without complication, with long-term current use of insulin (HCC)  Other  depression - with emotional eating  Class 1 obesity with serious comorbidity and body mass index (BMI) of 31.0 to 31.9 in adult, unspecified obesity type  PLAN:  Diabetes II Carmelita has been given extensive diabetes education by myself today including ideal fasting and post-prandial blood glucose readings, individual ideal Hgb A1c goals and hypoglycemia prevention. We discussed the importance of good blood sugar control to decrease the likelihood of diabetic complications such as nephropathy, neuropathy, limb loss, blindness, coronary artery disease, and death. We discussed the importance of intensive lifestyle modification including diet, exercise and weight loss as the first line treatment for diabetes. Analina's insulin decreased to 70 units by her Endocrinologist. Rianne agrees to follow up with our clinic in 2 to 3 weeks.  Depression with Emotional Eating Behaviors We discussed behavior modification techniques today to help Darby deal with her emotional eating and depression. Roselie agrees to discontinue Wellbutrin SR and she will follow up with our clinic in 2 to 3 weeks.  We spent > than 50% of the 15 minute visit on the counseling as documented in the note.  Obesity Woodie is currently in the action stage of change. As such, her goal is to continue with weight loss efforts She has agreed to follow the Category 3 plan Judeth has been instructed to work up to a goal of 150 minutes of combined cardio and strengthening exercise per week for weight loss and overall health benefits. We discussed the following Behavioral Modification Strategies today: increasing lean protein intake and no skipping meals   Emmali has agreed to follow up with our clinic in 2 to 3  weeks. She was informed of the importance of frequent follow up visits to maximize her success with intensive lifestyle modifications for her multiple health conditions.    OBESITY BEHAVIORAL INTERVENTION VISIT  Today's visit was # 10 out of  22.  Starting weight: 198 lbs Starting date: 11/21/16 Today's weight : 189 lbs  Today's date: 05/07/2017 Total lbs lost to date: 9 (Patients must lose 7 lbs in the first 6 months to continue with counseling)   ASK: We discussed the diagnosis of obesity with [name] today and Madysyn agreed to give [name] permission to discuss obesity behavioral modification therapy today.  ASSESS: Hema has the diagnosis of obesity and her BMI today is 31.45 Tamicka is in the action stage of change   ADVISE: Earl was educated on the multiple health risks of obesity as well as the benefit of weight loss to improve her health. She was advised of the need for long term treatment and the importance of lifestyle modifications.  AGREE: Multiple dietary modification options and treatment options were discussed and  Anastazia agreed to the above obesity treatment plan.   Wilhemena Durie, am acting as transcriptionist for Lacy Duverney, [facility] have reviewed this note and agree with its contents  I have reviewed the above documentation for accuracy and completeness, and I agree with the above. -Dennard Nip, MD

## 2017-05-07 NOTE — Progress Notes (Deleted)
Office: [phone]  /  Fax: [phone]   HPI:   Chief Complaint: OBESITY Breanna Webster is here to discuss her progress with her obesity treatment plan. She is on the Category 2 plan and is following her eating plan approximately 85 % of the time. She states she is walking for 10 minutes 3 times per week. Breanna Webster continues to do well with weight loss. She managed to control her snacking despite exacerbation of her chronic pain. She would like more meal planning ideas.  Her weight is 189 lb (85.7 kg) today and has had a weight loss of 1 pound over a period of 2 weeks since her last visit. She has lost 9 lbs since starting treatment with [name].  Diabetes II Breanna Webster has a diagnosis of diabetes type II. Breanna Webster states fasting BGs range between 110's and 180's. She denies any hypoglycemic episodes. Breanna Webster states due to chronic pain, she has not been as mindful of her eating and snacking more. Last A1c was 6.8 on 05/06/17. She has been working on intensive lifestyle modifications including diet, exercise, and weight loss to help control her blood glucose levels.  Chronic Panic Syndrome Breanna Webster has chronic lower back pain, neck pain, bilateral knee pain, and foot pain.  ALLERGIES: Allergies  Allergen Reactions  . Statins Other (See Comments)    Muscle aches    MEDICATIONS: Current Outpatient Medications on File Prior to Visit  Medication Sig Dispense Refill  . aspirin 81 MG tablet Take 81 mg by mouth daily.      . bisoprolol (ZEBETA) 5 MG tablet Take 1 tablet (5 mg total) by mouth daily. 90 tablet 3  . dexlansoprazole (DEXILANT) 60 MG capsule Take 1 capsule (60 mg total) by mouth daily. 30 capsule 3  . escitalopram (LEXAPRO) 20 MG tablet take 1 tablet by mouth once daily 90 tablet 1  . glucose blood (ONETOUCH VERIO) test strip Use to check blood sugar 2 times per day. Dx Code: E11.8 200 each 2  . hydrochlorothiazide (HYDRODIURIL) 12.5 MG tablet Take 1 tablet (12.5 mg total) by mouth daily. 90 tablet 3  .  insulin NPH Human (NOVOLIN N RELION) 100 UNIT/ML injection Inject 0.7 mLs (70 Units total) into the skin every morning. 30 mL 2  . Insulin Syringe-Needle U-100 (INSULIN SYRINGE .5CC/30GX1/2") 30G X 1/2" 0.5 ML MISC Use to inject insulin 2 times per day. 100 each 2  . levothyroxine (SYNTHROID, LEVOTHROID) 175 MCG tablet Take 1 tablet (175 mcg total) daily before breakfast by mouth. 90 tablet 2  . losartan (COZAAR) 50 MG tablet take 1 tablet by mouth once daily 90 tablet 1  . Multiple Vitamin (MULITIVITAMIN WITH MINERALS) TABS Take 1 tablet by mouth daily.    . nortriptyline (PAMELOR) 25 MG capsule Take 1 capsule (25 mg total) by mouth at bedtime. 90 capsule 3  . ONETOUCH DELICA LANCETS 60A MISC Use to check blood sugar 2 times per day. Dx Code: E11.8 200 each 1   No current facility-administered medications on file prior to visit.     PAST MEDICAL HISTORY: Past Medical History:  Diagnosis Date  . Arthritis   . Carotid artery stenosis    Mild  . Colon polyps   . Diabetes mellitus   . Diverticulitis   . Diverticulosis   . GERD (gastroesophageal reflux disease)   . Grave's disease   . Heart murmur    mitral prolapse  . Hyperlipidemia   . Hypertension   . IBS (irritable bowel syndrome)   .  Osteopenia   . Thalassemia minor     PAST SURGICAL HISTORY: Past Surgical History:  Procedure Laterality Date  . CHOLECYSTECTOMY    . HIATAL HERNIA REPAIR    . HYSTEROSCOPY     fibroids  . laparoscopy     fibroids    SOCIAL HISTORY: Social History   Tobacco Use  . Smoking status: Never Smoker  . Smokeless tobacco: Never Used  Substance Use Topics  . Alcohol use: No    Alcohol/week: 0.0 oz  . Drug use: No    FAMILY HISTORY: Family History  Adopted: Yes  Problem Relation Age of Onset  . Healthy Son        x 2  . Headache Unknown        Cluster headaches  . Colon cancer Neg Hx   . Pancreatic cancer Neg Hx   . Rectal cancer Neg Hx   . Stomach cancer Neg Hx      ROS: Review of Systems  Constitutional: Positive for weight loss.  Musculoskeletal:       Positive lower back pain Positive neck pain Positive foot pain  Endo/Heme/Allergies:       Negative hypoglycemia    PHYSICAL EXAM: Blood pressure 112/69, pulse 65, temperature 98.2 F (36.8 C), temperature source Oral, height 5' 5"  (1.651 m), weight 189 lb (85.7 kg), SpO2 97 %. Body mass index is 31.45 kg/m. Physical Exam  Constitutional: She is oriented to person, place, and time. She appears well-developed and well-nourished.  Cardiovascular: Normal rate.  Pulmonary/Chest: Effort normal.  Musculoskeletal: Normal range of motion.  Neurological: She is oriented to person, place, and time.  Skin: Skin is warm and dry.  Psychiatric: She has a normal mood and affect. Her behavior is normal.  Vitals reviewed.   RECENT LABS AND TESTS: BMET    Component Value Date/Time   NA 141 02/26/2017 1116   K 4.2 02/26/2017 1116   CL 103 02/26/2017 1116   CO2 24 02/26/2017 1116   GLUCOSE 142 (H) 02/26/2017 1116   GLUCOSE 148 (H) 07/19/2016 1453   GLUCOSE 135 09/08/2008   BUN 22 02/26/2017 1116   CREATININE 0.84 02/26/2017 1116   CALCIUM 9.6 02/26/2017 1116   GFRNONAA 69 02/26/2017 1116   GFRAA 80 02/26/2017 1116   Lab Results  Component Value Date   HGBA1C 6.8 05/06/2017   HGBA1C 6.1 02/08/2017   HGBA1C 7.8 (H) 11/21/2016   HGBA1C 7.0 09/27/2016   HGBA1C 7.5 07/05/2016   Lab Results  Component Value Date   INSULIN 76.3 (H) 11/21/2016   CBC    Component Value Date/Time   WBC 7.1 02/26/2017 1054   WBC 8.2 07/19/2016 1453   RBC 5.42 (H) 02/26/2017 1054   RBC 5.77 (H) 07/19/2016 1453   HGB 10.8 (L) 02/26/2017 1054   HCT 33.3 (L) 02/26/2017 1054   PLT 270 11/21/2016 1113   MCV 61 (L) 02/26/2017 1054   MCH 19.9 (L) 02/26/2017 1054   MCH 19.8 (L) 02/17/2014 1705   MCHC 32.4 02/26/2017 1054   MCHC 31.6 07/19/2016 1453   RDW 15.9 (H) 02/26/2017 1054   LYMPHSABS 1.2 02/26/2017  1054   MONOABS 0.7 07/19/2016 1453   EOSABS 0.1 02/26/2017 1054   BASOSABS 0.0 02/26/2017 1054   Iron/TIBC/Ferritin/ %Sat    Component Value Date/Time   IRON 137 05/29/2016 1116   FERRITIN 106.1 05/29/2016 1116   IRONPCTSAT 38.4 05/29/2016 1116   Lipid Panel     Component Value Date/Time   CHOL  156 11/21/2016 1113   TRIG 104 11/21/2016 1113   HDL 44 11/21/2016 1113   CHOLHDL 4 04/13/2016 0804   VLDL 20.8 04/13/2016 0804   LDLCALC 91 11/21/2016 1113   LDLDIRECT 98.0 05/24/2014 1343   Hepatic Function Panel     Component Value Date/Time   PROT 6.9 02/26/2017 1116   ALBUMIN 3.9 02/26/2017 1116   AST 34 02/26/2017 1116   ALT 33 (H) 02/26/2017 1116   ALKPHOS 71 02/26/2017 1116   BILITOT 0.7 02/26/2017 1116   BILIDIR 0.2 04/13/2016 0804   IBILI 0.5 02/17/2014 1705      Component Value Date/Time   TSH 0.90 05/06/2017 1350   TSH 5.170 (H) 02/26/2017 1116   TSH 2.560 11/21/2016 1113    ASSESSMENT AND PLAN: Type 2 diabetes mellitus without complication, with long-term current use of insulin (HCC)  Other depression - with emotional eating  Class 1 obesity with serious comorbidity and body mass index (BMI) of 31.0 to 31.9 in adult, unspecified obesity type  PLAN:  Diabetes II Breanna Webster has been given extensive diabetes education by myself today including ideal fasting and post-prandial blood glucose readings, individual ideal Hgb A1c goals and hypoglycemia prevention. We discussed the importance of good blood sugar control to decrease the likelihood of diabetic complications such as nephropathy, neuropathy, limb loss, blindness, coronary artery disease, and death. We discussed the importance of intensive lifestyle modification including diet, exercise and weight loss as the first line treatment for diabetes. Breanna Webster agrees to continue her diabetes medications and she will follow up with our clinic in 4 weeks.  Chronic Panic Syndrome Breanna Webster agrees to continues taking Mobic 7.5 mg qd  #30 and we will refill for 1 month. Breanna Webster agrees to follow up with our clinic in 4 weeks.  Obesity Breanna Webster is currently in the action stage of change. As such, her goal is to continue with weight loss efforts She has agreed to follow the Category 2 plan Breanna Webster has been instructed to work up to a goal of 150 minutes of combined cardio and strengthening exercise per week for weight loss and overall health benefits. We discussed the following Behavioral Modification Strategies today: increasing lean protein intake and better snacking choices   Breanna Webster has agreed to follow up with our clinic in 4 weeks. She was informed of the importance of frequent follow up visits to maximize her success with intensive lifestyle modifications for her multiple health conditions.    OBESITY BEHAVIORAL INTERVENTION VISIT  Today's visit was # 10 out of 22.  Starting weight: 198 lbs Starting date: 11/21/16 Today's weight : 189 lbs  Today's date: 05/07/2017 Total lbs lost to date: 9 (Patients must lose 7 lbs in the first 6 months to continue with counseling)   ASK: We discussed the diagnosis of obesity with Breanna Webster today and Breanna Webster agreed to give [name] permission to discuss obesity behavioral modification therapy today.  ASSESS: Breanna Webster has the diagnosis of obesity and her BMI today is 31.45 Breanna Webster is in the action stage of change   ADVISE: Breanna Webster was educated on the multiple health risks of obesity as well as the benefit of weight loss to improve her health. She was advised of the need for long term treatment and the importance of lifestyle modifications.  AGREE: Multiple dietary modification options and treatment options were discussed and  Breanna Webster agreed to the above obesity treatment plan.   Breanna Webster, am acting as transcriptionist for [employer], PA-C

## 2017-05-17 ENCOUNTER — Encounter: Payer: Self-pay | Admitting: Genetics

## 2017-05-20 ENCOUNTER — Encounter: Payer: Self-pay | Admitting: Genetic Counselor

## 2017-05-21 ENCOUNTER — Other Ambulatory Visit: Payer: Self-pay

## 2017-05-21 MED ORDER — "INSULIN SYRINGE-NEEDLE U-100 31G X 5/16"" 1 ML MISC": 2 refills | Status: DC

## 2017-05-22 ENCOUNTER — Inpatient Hospital Stay: Payer: Medicare Other | Attending: Genetic Counselor | Admitting: Genetics

## 2017-05-22 ENCOUNTER — Encounter: Payer: Self-pay | Admitting: Genetic Counselor

## 2017-05-22 ENCOUNTER — Inpatient Hospital Stay: Payer: Medicare Other

## 2017-05-22 DIAGNOSIS — Z860100 Personal history of colon polyps, unspecified: Secondary | ICD-10-CM | POA: Insufficient documentation

## 2017-05-22 DIAGNOSIS — Z315 Encounter for genetic counseling: Secondary | ICD-10-CM | POA: Diagnosis not present

## 2017-05-22 HISTORY — DX: Personal history of colon polyps, unspecified: Z86.0100

## 2017-05-22 HISTORY — DX: Personal history of colonic polyps: Z86.010

## 2017-05-22 NOTE — Progress Notes (Signed)
REFERRING PROVIDER: Yetta Flock, MD [address] [place], [place] [address]  PRIMARY PROVIDER:  [name], NP  PRIMARY REASON FOR VISIT:  1. History of colonic polyps     HISTORY OF PRESENT ILLNESS:   Breanna Webster, a 72 y.o. female, was seen for a [facility] cancer genetics consultation at the request of Dr. Havery Moros due to a personal history of multiple colon polyps.  Breanna Webster presents to clinic today to discuss the possibility of a hereditary predisposition to cancer, genetic testing, and to further clarify her future cancer risks, as well as potential cancer risks for family members.   In Nov 2018 colonoscopy revealed 28 tubular adenomas.  Her previous colonoscopy in 2015 revealed 4 tubular adenomas. Her coloncopy in 2005 was normal.   CANCER HISTORY:   No history exists.     HORMONAL RISK FACTORS:  Menarche was at age 78.  OCP use for approximately 0 years.  Ovaries intact: yes.  Hysterectomy: no. Had tumor near 'blood supply to utereus' she was put on Lupron for <2 years to shrink this.  It did shrink and therefore she never had to get a hystectomy.  Menopausal status: postmenopausal. 50's HRT use: Lupron <2 years years. Colonoscopy: yes; see dicsussion above about colon polyp history. Mammogram within the last year: yes. Number of breast biopsies: 0.  Past Medical History:  Diagnosis Date  . Arthritis   . Carotid artery stenosis    Mild  . Colon polyps   . Diabetes mellitus   . Diverticulitis   . Diverticulosis   . GERD (gastroesophageal reflux disease)   . Grave's disease   . Heart murmur    mitral prolapse  . History of colonic polyps 05/22/2017  . Hyperlipidemia   . Hypertension   . IBS (irritable bowel syndrome)   . Osteopenia   . Thalassemia minor     Past Surgical History:  Procedure Laterality Date  . CHOLECYSTECTOMY    . HIATAL HERNIA REPAIR    . HYSTEROSCOPY     fibroids  . laparoscopy     fibroids    Social  History   Socioeconomic History  . Marital status: Married    Spouse name: Not on file  . Number of children: 2  . Years of education: Not on file  . Highest education level: Not on file  Social Needs  . Financial resource strain: Not on file  . Food insecurity - worry: Not on file  . Food insecurity - inability: Not on file  . Transportation needs - medical: Not on file  . Transportation needs - non-medical: Not on file  Occupational History  . Occupation: Retired  Tobacco Use  . Smoking status: Never Smoker  . Smokeless tobacco: Never Used  Substance and Sexual Activity  . Alcohol use: No    Alcohol/week: 0.0 oz  . Drug use: No  . Sexual activity: Not on file  Other Topics Concern  . Not on file  Social History Narrative   Lives with husband in a one story home.     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   Education: college.     FAMILY HISTORY:  We obtained a detailed, 4-generation family history.  Significant diagnoses are listed below: Family History  Adopted: Yes  Problem Relation Age of Onset  . Healthy Son        x 2  . Headache Unknown  Cluster headaches  . Colon cancer Neg Hx   . Pancreatic cancer Neg Hx   . Rectal cancer Neg Hx   . Stomach cancer Neg Hx    Breanna Webster is adopted and therefore has very limited information about her family history.  She has been told that her parents died in their 6's (mother due to heart failure and unk causes for her father).  She reports that she had about 8 siblings.  One brother died at 2 and one sister died due to complications of diabetes. She is not aware of any history of cancer.  Breanna Webster has 2 sons.  One is 31 and has no history of cancer/colon polyps and has had a colonoscopy.  This son has 2 daughters ages 33 and 54 with no history of cancer.  Breanna Webster other son is 78 with no history of cancer and no children.  He has not  had a colonoscopy.   Breanna Webster is unaware of previous family history of genetic testing for hereditary cancer risks.  Breanna Webster had 55 and me ancestry genetic testing that reports 89% [place] ancestry and 1.8% Ashkenazi Jewish ancestry. There is no known consanguinity.  GENETIC COUNSELING ASSESSMENT: NIKAELA COYNE is a 72 y.o. female with a personal history of colon polyps which is somewhat suggestive of a Hereditary Cancer Predisposition Syndrome. We, therefore, discussed and recommended the following at today's visit.   DISCUSSION: We reviewed the characteristics, features and inheritance patterns of hereditary cancer syndromes. We also discussed genetic testing, including the appropriate family members to test, the process of testing, insurance coverage and turn-around-time for results. We discussed the implications of a negative, positive and/or variant of uncertain significant result. We recommended Breanna Webster pursue genetic testing for the Common Hereditary Cancer gene panel. The Common Hereditary Cancer Panel offered by [facility] includes sequencing and/or deletion duplication testing of the following 47 genes: APC, ATM, AXIN2, BARD1, BMPR1A, BRCA1, BRCA2, BRIP1, CDH1, CDKN2A (p14ARF), CDKN2A (p16INK4a), CKD4, CHEK2, CTNNA1, DICER1, EPCAM (Deletion/duplication testing only), GREM1 (promoter region deletion/duplication testing only), KIT, MEN1, MLH1, MSH2, MSH3, MSH6, MUTYH, NBN, NF1, NHTL1, PALB2, PDGFRA, PMS2, POLD1, POLE, PTEN, RAD50, RAD51C, RAD51D, SDHB, SDHC, SDHD, SMAD4, SMARCA4. STK11, TP53, TSC1, TSC2, and VHL.  The following genes were evaluated for sequence changes only: SDHA and HOXB13 c.251G>A variant only.  When an individual develops multiple adenomatous colon polyps, there is concern for a condition called Familial Adenomatous Polyposis (FAP).   In the classic form of FAP, people generally have hundreds to thousands of adenomatous polyps.  A milder version of FAP called  Attenuated FAP (AFAP) is characterized by less than 100 adenomatous polyps.  There are two genes that have been associated with FAP/AFAP and each has a different pattern of inheritance.  The first gene is called APC and is inherited in an autosomal dominant fashion.  In this case, having only one alteration (mutation) in one copy of the APC gene causes polyps to develop.  In about 30% of cases caused by APC, the condition is diagnosed for the first time in a family where both parents do not have the condition.  In other words, there are 30% of people with FAP/AFAP where the mutation occurred in them for the first time.    Colon polyposis can also be caused by mutations in the MUTYH gene, which causes a condition known as MUTYH-associated polyposis.  This is an autosomal recessive genetic condition.  In this case, an individual needs to have inherited  a mutation in each copy of the MYH gene, one from each parent, in order to develop polyposis.  Carrying just one mutation of the MYH gene does not typically cause any problems or place an individual at risk for cancer.   Another syndrome we were concerned about in your family is called Lynch Syndrome, also called HNPCC (Hereditary Non-Polyposis Colon Cancer).  This syndrome increases the risk for colon, uterine, ovarian and stomach cancers, brain cancers, as well as others.  Families with Lynch Syndrome tend to have multiple family members with these cancers, typically diagnosed under age 105, and diagnoses in multiple generations. The genes that are known to cause Lynch Syndrome are called MLH1, MSH2, MSH6, PMS2 and EPCAM.    Some families that appear to have any of these conditions will have normal testing of these genes.  In those cases it is possible that the large amount of colon polyps may be causes by a syndrome called Serrated Polyposis Syndrome.  This condition causes an abnormally large amount of serrate polyps to develop and believe to be  hereditary.  However, the genetic cause of this syndrome has not yet been identified.    We discussed that if she is found to have a mutation in one of these genes, it may impact future medical management recommendations such as increased cancer screenings and consideration of risk reducing surgeries.  A positive result could also have implications for the patient's family members.  A Negative result would mean we were unable to identify a hereditary component to her development of adenomatous polyps, but does not rule out the possibility of a hereditary basis for her polyps. There could be mutations that are undetectable by current technology, or in genes not yet tested or identified to increase cancer risk.    We discussed the potential to find a Variant of Uncertain Significance or VUS.  These are variants that have not yet been identified as pathogenic or benign, and it is unknown if this variant is associated with increased cancer risk or if this is a normal finding.  Most VUS's are reclassified to benign or likely benign.   It should not be used to make medical management decisions. With time, we suspect the lab will determine the significance of any VUS's identified if any.   Based on Breanna Webster's personal history of cancer, she meets medical criteria for genetic testing. Despite that she meets criteria, she may still have an out of pocket cost. We discussed that if her out of pocket cost for testing is over $100, the laboratory will call and confirm whether she wants to proceed with testing.  If the out of pocket cost of testing is less than $100 she will be billed by the genetic testing laboratory.  We discussed individuals with medicare typically have $0 OOP cost for genetic testing.   We discussed that some people do not want to undergo genetic testing due to fear of genetic discrimination.  A federal law called the Genetic Information Non-Discrimination Act (GINA) of 2008 helps protect  individuals against genetic discrimination based on their genetic test results.  It impacts both health insurance and employment.  For health insurance, it protects against increased premiums, being kicked off insurance or being forced to take a test in order to be insured.  For employment it protects against hiring, firing and promoting decisions based on genetic test results.  Health status due to a cancer diagnosis is not protected under GINA.  This law does not  protect life insurance, disability insurance, or other types of insurance.    PLAN: After considering the risks, benefits, and limitations, Breanna Webster  provided informed consent to pursue genetic testing and the blood sample was sent to [facility] for analysis of the Common Hereditary Cancer Panel. Results should be available within approximately 2-3 weeks' time, at which point they will be disclosed by telephone to Breanna Webster, as will any additional recommendations warranted by these results. Breanna Webster will receive a summary of her genetic counseling visit and a copy of her results once available. This information will also be available in Epic. We encouraged Breanna Webster to remain in contact with cancer genetics annually so that we can continuously update the family history and inform her of any changes in cancer genetics and testing that may be of benefit for her family. Breanna Webster questions were answered to her satisfaction today. Our contact information was provided should additional questions or concerns arise.  Lastly, we encouraged Breanna Webster to remain in contact with cancer genetics annually so that we can continuously update the family history and inform her of any changes in cancer genetics and testing that may be of benefit for this family.   Ms.  Webster questions were answered to her satisfaction today. Our contact information was provided should additional questions or concerns arise. Thank you for  the referral and allowing [name] to share in the care of your patient.   Tana Felts, MS Genetic Counselor [email or web address] phone: [phone]  The patient was seen for a total of 60 minutes in face-to-face genetic counseling. The patient was accompanied today by her husband, [name].  Genetic counseling intern Donnetta Hail was present and participated in part of this session under my supervision.

## 2017-05-23 ENCOUNTER — Other Ambulatory Visit: Payer: Self-pay | Admitting: Adult Health

## 2017-05-27 ENCOUNTER — Ambulatory Visit (INDEPENDENT_AMBULATORY_CARE_PROVIDER_SITE_OTHER): Payer: Medicare Other | Admitting: Physician Assistant

## 2017-05-27 VITALS — BP 117/69 | HR 66 | Temp 98.5°F | Ht 65.0 in | Wt 191.0 lb

## 2017-05-27 DIAGNOSIS — E119 Type 2 diabetes mellitus without complications: Secondary | ICD-10-CM

## 2017-05-27 DIAGNOSIS — E669 Obesity, unspecified: Secondary | ICD-10-CM

## 2017-05-27 DIAGNOSIS — Z6831 Body mass index (BMI) 31.0-31.9, adult: Secondary | ICD-10-CM | POA: Diagnosis not present

## 2017-05-27 NOTE — Progress Notes (Signed)
Office: [phone]  /  Fax: [phone]   HPI:   Chief Complaint: OBESITY Lydiah is here to discuss her progress with her obesity treatment plan. She is on the Category 3 plan and is following her eating plan approximately 75 % of the time. She states she is exercising 0 minutes 0 times per week. Danyele states she has deviated with her eating and has not kept up with her recommended protein. She is motivated to get back on track and continue with weight loss.  Her weight is 191 lb (86.6 kg) today and has gained 2 pounds since her last visit. She has lost 7 lbs since starting treatment with [name].  Diabetes II Elyn has a diagnosis of diabetes type II. Romana states fasting range from 100's and 195. She states higher readings are associated with eating high carbohydrates or high calorie dinner. She  denies any hypoglycemic episodes. Jakerria has increased Novalog to 80 units from 70 units. Last A1c was 6.8 on 05/06/17. She has been working on intensive lifestyle modifications including diet, exercise, and weight loss to help control her blood glucose levels.  ALLERGIES: Allergies  Allergen Reactions  . Statins Other (See Comments)    Muscle aches    MEDICATIONS: Current Outpatient Medications on File Prior to Visit  Medication Sig Dispense Refill  . aspirin 81 MG tablet Take 81 mg by mouth daily.      . bisoprolol (ZEBETA) 5 MG tablet Take 1 tablet (5 mg total) by mouth daily. 90 tablet 3  . dexlansoprazole (DEXILANT) 60 MG capsule Take 1 capsule (60 mg total) by mouth daily. 30 capsule 3  . escitalopram (LEXAPRO) 20 MG tablet take 1 tablet by mouth once daily 90 tablet 1  . glucose blood (ONETOUCH VERIO) test strip Use to check blood sugar 2 times per day. Dx Code: E11.8 200 each 2  . hydrochlorothiazide (HYDRODIURIL) 12.5 MG tablet Take 1 tablet (12.5 mg total) by mouth daily. 90 tablet 3  . insulin NPH Human (NOVOLIN N RELION) 100 UNIT/ML injection Inject 0.7 mLs (70 Units total) into the skin  every morning. 30 mL 2  . Insulin Syringe-Needle U-100 (BD INSULIN SYRINGE U/F) 31G X 5/16" 1 ML MISC USE TO INJECT INSULIN ONCE DAILY 90 each 2  . Insulin Syringe-Needle U-100 (INSULIN SYRINGE .5CC/30GX1/2") 30G X 1/2" 0.5 ML MISC Use to inject insulin 2 times per day. 100 each 2  . levothyroxine (SYNTHROID, LEVOTHROID) 175 MCG tablet Take 1 tablet (175 mcg total) daily before breakfast by mouth. 90 tablet 2  . losartan (COZAAR) 50 MG tablet take 1 tablet by mouth once daily 90 tablet 1  . Multiple Vitamin (MULITIVITAMIN WITH MINERALS) TABS Take 1 tablet by mouth daily.    . nortriptyline (PAMELOR) 25 MG capsule Take 1 capsule (25 mg total) by mouth at bedtime. 90 capsule 3  . ONETOUCH DELICA LANCETS 94B MISC Use to check blood sugar 2 times per day. Dx Code: E11.8 200 each 1   No current facility-administered medications on file prior to visit.     PAST MEDICAL HISTORY: Past Medical History:  Diagnosis Date  . Arthritis   . Carotid artery stenosis    Mild  . Colon polyps   . Diabetes mellitus   . Diverticulitis   . Diverticulosis   . GERD (gastroesophageal reflux disease)   . Grave's disease   . Heart murmur    mitral prolapse  . History of colonic polyps 05/22/2017  . Hyperlipidemia   .  Hypertension   . IBS (irritable bowel syndrome)   . Osteopenia   . Thalassemia minor     PAST SURGICAL HISTORY: Past Surgical History:  Procedure Laterality Date  . CHOLECYSTECTOMY    . HIATAL HERNIA REPAIR    . HYSTEROSCOPY     fibroids  . laparoscopy     fibroids    SOCIAL HISTORY: Social History   Tobacco Use  . Smoking status: Never Smoker  . Smokeless tobacco: Never Used  Substance Use Topics  . Alcohol use: No    Alcohol/week: 0.0 oz  . Drug use: No    FAMILY HISTORY: Family History  Adopted: Yes  Problem Relation Age of Onset  . Healthy Son        x 2  . Headache Unknown        Cluster headaches  . Colon cancer Neg Hx   . Pancreatic cancer Neg Hx   . Rectal  cancer Neg Hx   . Stomach cancer Neg Hx     ROS: Review of Systems  Constitutional: Negative for weight loss.  Endo/Heme/Allergies:       Negative hypoglycemia    PHYSICAL EXAM: Blood pressure 117/69, pulse 66, temperature 98.5 F (36.9 C), temperature source Oral, height 5' 5"  (1.651 m), weight 191 lb (86.6 kg), SpO2 98 %. Body mass index is 31.78 kg/m. Physical Exam  Constitutional: She is oriented to person, place, and time. She appears well-developed and well-nourished.  Cardiovascular: Normal rate.  Pulmonary/Chest: Effort normal.  Musculoskeletal: Normal range of motion.  Neurological: She is oriented to person, place, and time.  Skin: Skin is warm and dry.  Psychiatric: She has a normal mood and affect. Her behavior is normal.  Vitals reviewed.   RECENT LABS AND TESTS: BMET    Component Value Date/Time   NA 141 02/26/2017 1116   K 4.2 02/26/2017 1116   CL 103 02/26/2017 1116   CO2 24 02/26/2017 1116   GLUCOSE 142 (H) 02/26/2017 1116   GLUCOSE 148 (H) 07/19/2016 1453   GLUCOSE 135 09/08/2008   BUN 22 02/26/2017 1116   CREATININE 0.84 02/26/2017 1116   CALCIUM 9.6 02/26/2017 1116   GFRNONAA 69 02/26/2017 1116   GFRAA 80 02/26/2017 1116   Lab Results  Component Value Date   HGBA1C 6.8 05/06/2017   HGBA1C 6.1 02/08/2017   HGBA1C 7.8 (H) 11/21/2016   HGBA1C 7.0 09/27/2016   HGBA1C 7.5 07/05/2016   Lab Results  Component Value Date   INSULIN 76.3 (H) 11/21/2016   CBC    Component Value Date/Time   WBC 7.1 02/26/2017 1054   WBC 8.2 07/19/2016 1453   RBC 5.42 (H) 02/26/2017 1054   RBC 5.77 (H) 07/19/2016 1453   HGB 10.8 (L) 02/26/2017 1054   HCT 33.3 (L) 02/26/2017 1054   PLT 270 11/21/2016 1113   MCV 61 (L) 02/26/2017 1054   MCH 19.9 (L) 02/26/2017 1054   MCH 19.8 (L) 02/17/2014 1705   MCHC 32.4 02/26/2017 1054   MCHC 31.6 07/19/2016 1453   RDW 15.9 (H) 02/26/2017 1054   LYMPHSABS 1.2 02/26/2017 1054   MONOABS 0.7 07/19/2016 1453   EOSABS 0.1  02/26/2017 1054   BASOSABS 0.0 02/26/2017 1054   Iron/TIBC/Ferritin/ %Sat    Component Value Date/Time   IRON 137 05/29/2016 1116   FERRITIN 106.1 05/29/2016 1116   IRONPCTSAT 38.4 05/29/2016 1116   Lipid Panel     Component Value Date/Time   CHOL 156 11/21/2016 1113   TRIG 104  11/21/2016 1113   HDL 44 11/21/2016 1113   CHOLHDL 4 04/13/2016 0804   VLDL 20.8 04/13/2016 0804   LDLCALC 91 11/21/2016 1113   LDLDIRECT 98.0 05/24/2014 1343   Hepatic Function Panel     Component Value Date/Time   PROT 6.9 02/26/2017 1116   ALBUMIN 3.9 02/26/2017 1116   AST 34 02/26/2017 1116   ALT 33 (H) 02/26/2017 1116   ALKPHOS 71 02/26/2017 1116   BILITOT 0.7 02/26/2017 1116   BILIDIR 0.2 04/13/2016 0804   IBILI 0.5 02/17/2014 1705      Component Value Date/Time   TSH 0.90 05/06/2017 1350   TSH 5.170 (H) 02/26/2017 1116   TSH 2.560 11/21/2016 1113    ASSESSMENT AND PLAN: Type 2 diabetes mellitus without complication, without long-term current use of insulin (HCC)  Class 1 obesity with serious comorbidity and body mass index (BMI) of 31.0 to 31.9 in adult, unspecified obesity type  PLAN:  Diabetes II Liz has been given extensive diabetes education by myself today including ideal fasting and post-prandial blood glucose readings, individual ideal Hgb A1c goals and hypoglycemia prevention. We discussed the importance of good blood sugar control to decrease the likelihood of diabetic complications such as nephropathy, neuropathy, limb loss, blindness, coronary artery disease, and death. We discussed the importance of intensive lifestyle modification including diet, exercise and weight loss as the first line treatment for diabetes. Vaniyah agrees to continue her diabetes medications and she agrees to follow up with our clinic in 2 weeks.  We spent > than 50% of the 15 minute visit on the counseling as documented in the note.  Obesity Nashley is currently in the action stage of change. As such,  her goal is to continue with weight loss efforts She has agreed to follow the Category 3 plan Tameika has been instructed to work up to a goal of 150 minutes of combined cardio and strengthening exercise per week for weight loss and overall health benefits. We discussed the following Behavioral Modification Strategies today: increasing lean protein intake and work on meal planning and easy cooking plans   Han has agreed to follow up with our clinic in 2 weeks. She was informed of the importance of frequent follow up visits to maximize her success with intensive lifestyle modifications for her multiple health conditions.   OBESITY BEHAVIORAL INTERVENTION VISIT  Today's visit was # 11 out of 22.  Starting weight: 198 lbs Starting date: 11/21/16 Today's weight : 191 lbs  Today's date: 05/27/2017 Total lbs lost to date: 7 (Patients must lose 7 lbs in the first 6 months to continue with counseling)   ASK: We discussed the diagnosis of obesity with [name] today and Shavawn agreed to give [name] permission to discuss obesity behavioral modification therapy today.  ASSESS: Corah has the diagnosis of obesity and her BMI today is 31.78 Natalyah is in the action stage of change   ADVISE: Deeann was educated on the multiple health risks of obesity as well as the benefit of weight loss to improve her health. She was advised of the need for long term treatment and the importance of lifestyle modifications.  AGREE: Multiple dietary modification options and treatment options were discussed and  Sadia agreed to the above obesity treatment plan.   Wilhemena Durie, am acting as transcriptionist for Lacy Duverney, PA-C I, Lacy Duverney [facility], have reviewed this note and agree with its content.

## 2017-05-27 NOTE — Telephone Encounter (Signed)
Sent to the pharmacy by e-scribe for 90 days.  Spoke to pt's husband and pt now scheduled for yearly on 06/14/17 @ 8 AM.

## 2017-05-28 ENCOUNTER — Other Ambulatory Visit: Payer: Self-pay

## 2017-05-28 ENCOUNTER — Telehealth: Payer: Self-pay | Admitting: Gastroenterology

## 2017-05-28 MED ORDER — DEXLANSOPRAZOLE 30 MG PO CPDR: 30.0000 mg | DELAYED_RELEASE_CAPSULE | Freq: Every day | ORAL | 1 refills | Status: DC

## 2017-05-28 NOTE — Telephone Encounter (Signed)
Script sent  

## 2017-05-28 NOTE — Telephone Encounter (Signed)
Patient states she was told after she finished the 6m capsules of medication dexilant that she needed to start taking the 334mcapsules. Pt states she just had her last refill and would like a new prescription for 3064ment to [facility].

## 2017-05-28 NOTE — Telephone Encounter (Signed)
[facility]

## 2017-05-31 ENCOUNTER — Telehealth: Payer: Self-pay | Admitting: Genetics

## 2017-05-31 NOTE — Telephone Encounter (Signed)
Revealed negative genetic testing.    We discussed that we do not know why she has developed many colon polyps.   We did not identify a hereditary cause on this genetic testing, but there could be mutations in genes not yet discovered to increase polyp and cancer risk or our current technology cannot detect the causative mutation.  While reassuring, we still recommend she continue with the recommended colon screening recommended to her by her GI physician.   Her children should inform their doctors about the family history of colon polyps as they may be recommended to have increased colon polyp/cancer screening as well. It will be important for her to keep in contact with genetics to learn if additional testing may be needed in the future.  Genetic Counseling Intern, Donnetta Hail called out this result under my supervision.

## 2017-06-03 DIAGNOSIS — M545 Low back pain: Secondary | ICD-10-CM | POA: Diagnosis not present

## 2017-06-03 DIAGNOSIS — M47817 Spondylosis without myelopathy or radiculopathy, lumbosacral region: Secondary | ICD-10-CM | POA: Diagnosis not present

## 2017-06-03 DIAGNOSIS — M48061 Spinal stenosis, lumbar region without neurogenic claudication: Secondary | ICD-10-CM | POA: Diagnosis not present

## 2017-06-04 ENCOUNTER — Other Ambulatory Visit: Payer: Self-pay

## 2017-06-04 ENCOUNTER — Telehealth: Payer: Self-pay | Admitting: Endocrinology

## 2017-06-04 MED ORDER — INSULIN NPH (HUMAN) (ISOPHANE) 100 UNIT/ML ~~LOC~~ SUSP: 70.0000 [IU] | SUBCUTANEOUS | 2 refills | Status: DC

## 2017-06-04 NOTE — Telephone Encounter (Signed)
Pt needs a script sent to pharmacy   insulin NPH Human (NOVOLIN N RELION) 100 UNIT/ML injection    [place], [place] - [place] [address]

## 2017-06-04 NOTE — Telephone Encounter (Signed)
I have sent to pharmacy.

## 2017-06-08 ENCOUNTER — Other Ambulatory Visit: Payer: Self-pay | Admitting: Endocrinology

## 2017-06-10 ENCOUNTER — Ambulatory Visit (INDEPENDENT_AMBULATORY_CARE_PROVIDER_SITE_OTHER): Payer: Medicare Other | Admitting: Physician Assistant

## 2017-06-10 NOTE — Telephone Encounter (Signed)
Patient stated the pharmacy haven't received insulin insulin NPH Human (NOVOLIN N RELION) 100 UNIT/ML injection [[id number]]   [place], [place] [address] [phone] (Phone) [phone] (Fax)

## 2017-06-10 NOTE — Telephone Encounter (Signed)
I have resent fax for prescription to [facility].

## 2017-06-12 ENCOUNTER — Ambulatory Visit (INDEPENDENT_AMBULATORY_CARE_PROVIDER_SITE_OTHER): Payer: Medicare Other | Admitting: Physician Assistant

## 2017-06-12 VITALS — BP 125/76 | HR 67 | Temp 98.6°F | Ht 65.0 in | Wt 192.0 lb

## 2017-06-12 DIAGNOSIS — E669 Obesity, unspecified: Secondary | ICD-10-CM | POA: Diagnosis not present

## 2017-06-12 DIAGNOSIS — E119 Type 2 diabetes mellitus without complications: Secondary | ICD-10-CM | POA: Diagnosis not present

## 2017-06-12 DIAGNOSIS — Z6832 Body mass index (BMI) 32.0-32.9, adult: Secondary | ICD-10-CM | POA: Diagnosis not present

## 2017-06-12 DIAGNOSIS — Z794 Long term (current) use of insulin: Secondary | ICD-10-CM

## 2017-06-12 NOTE — Progress Notes (Signed)
Office: [phone]  /  Fax: [phone]   HPI:   Chief Complaint: OBESITY Breanna Webster is here to discuss her progress with her obesity treatment plan. She is on the Category 3 plan and is following her eating plan approximately 75 % of the time. She states she is exercising 0 minutes 0 times per week. Breanna Webster states she is frustrated that she has not lost any weight. She is not following the structured meal plan, and she does not incorporate the lean protein as advised. She is stating that she is not able to find the kosher meats she needs. Her weight is 192 lb (87.1 kg) today and has had a weight gain of 1 pound over a period of 2 weeks since her last visit. She has lost 6 lbs since starting treatment with [name].  Diabetes II on insulin with hyperglycemia Breanna Webster has a diagnosis of diabetes type II. Breanna Webster states fasting BGs range between 140's and 180's, postprandial as high as 270's, and denies any hypoglycemic episodes. She takes 80 units of insulin. She has been working on intensive lifestyle modifications including diet, exercise, and weight loss to help control her blood glucose levels. Breanna Webster is advised on following up with her endocrinologist for further adjustment of her insulin due to her elevated sugars.  ALLERGIES: Allergies  Allergen Reactions  . Statins Other (See Comments)    Muscle aches    MEDICATIONS: Current Outpatient Medications on File Prior to Visit  Medication Sig Dispense Refill  . aspirin 81 MG tablet Take 81 mg by mouth daily.      . bisoprolol (ZEBETA) 5 MG tablet Take 1 tablet (5 mg total) by mouth daily. 90 tablet 3  . dexlansoprazole (DEXILANT) 60 MG capsule Take 1 capsule (60 mg total) by mouth daily. 30 capsule 3  . Dexlansoprazole 30 MG capsule Take 1 capsule (30 mg total) by mouth daily. 30 capsule 1  . escitalopram (LEXAPRO) 20 MG tablet take 1 tablet by mouth once daily 90 tablet 0  . glucose blood (ONETOUCH VERIO) test strip Use to check blood sugar 2 times per  day. Dx Code: E11.8 200 each 2  . hydrochlorothiazide (HYDRODIURIL) 12.5 MG tablet Take 1 tablet (12.5 mg total) by mouth daily. 90 tablet 3  . insulin NPH Human (NOVOLIN N RELION) 100 UNIT/ML injection Inject 0.7 mLs (70 Units total) into the skin every morning. 30 mL 2  . Insulin Syringe-Needle U-100 (BD INSULIN SYRINGE U/F) 31G X 5/16" 1 ML MISC USE TO INJECT INSULIN ONCE DAILY 90 each 2  . Insulin Syringe-Needle U-100 (INSULIN SYRINGE .5CC/30GX1/2") 30G X 1/2" 0.5 ML MISC Use to inject insulin 2 times per day. 100 each 2  . levothyroxine (SYNTHROID, LEVOTHROID) 175 MCG tablet Take 1 tablet (175 mcg total) daily before breakfast by mouth. 90 tablet 2  . losartan (COZAAR) 50 MG tablet take 1 tablet by mouth once daily 90 tablet 1  . Multiple Vitamin (MULITIVITAMIN WITH MINERALS) TABS Take 1 tablet by mouth daily.    . nortriptyline (PAMELOR) 25 MG capsule Take 1 capsule (25 mg total) by mouth at bedtime. 90 capsule 3  . NOVOLIN N RELION 100 UNIT/ML injection INJECT 1.3MLS (130 UNITS TOTAL) INTO THE SKIN EVERY MORNING 5 vial 11  . ONETOUCH DELICA LANCETS 16B MISC Use to check blood sugar 2 times per day. Dx Code: E11.8 200 each 1   No current facility-administered medications on file prior to visit.     PAST MEDICAL HISTORY: Past Medical History:  Diagnosis Date  . Arthritis   . Carotid artery stenosis    Mild  . Colon polyps   . Diabetes mellitus   . Diverticulitis   . Diverticulosis   . GERD (gastroesophageal reflux disease)   . Grave's disease   . Heart murmur    mitral prolapse  . History of colonic polyps 05/22/2017  . Hyperlipidemia   . Hypertension   . IBS (irritable bowel syndrome)   . Osteopenia   . Thalassemia minor     PAST SURGICAL HISTORY: Past Surgical History:  Procedure Laterality Date  . CHOLECYSTECTOMY    . HIATAL HERNIA REPAIR    . HYSTEROSCOPY     fibroids  . laparoscopy     fibroids    SOCIAL HISTORY: Social History   Tobacco Use  . Smoking  status: Never Smoker  . Smokeless tobacco: Never Used  Substance Use Topics  . Alcohol use: No    Alcohol/week: 0.0 oz  . Drug use: No    FAMILY HISTORY: Family History  Adopted: Yes  Problem Relation Age of Onset  . Healthy Son        x 2  . Headache Unknown        Cluster headaches  . Colon cancer Neg Hx   . Pancreatic cancer Neg Hx   . Rectal cancer Neg Hx   . Stomach cancer Neg Hx     ROS: Review of Systems  Constitutional: Negative for weight loss.  Endo/Heme/Allergies:       Negative for hypoglycemia Positive for hyperglycemia    PHYSICAL EXAM: Blood pressure 125/76, pulse 67, temperature 98.6 F (37 C), temperature source Oral, height 5' 5"  (1.651 m), weight 192 lb (87.1 kg), SpO2 97 %. Body mass index is 31.95 kg/m. Physical Exam  Constitutional: She is oriented to person, place, and time. She appears well-developed and well-nourished.  Cardiovascular: Normal rate.  Pulmonary/Chest: Effort normal.  Musculoskeletal: Normal range of motion.  Neurological: She is oriented to person, place, and time.  Skin: Skin is warm and dry.  Psychiatric: She has a normal mood and affect. Her behavior is normal.  Vitals reviewed.   RECENT LABS AND TESTS: BMET    Component Value Date/Time   NA 141 02/26/2017 1116   K 4.2 02/26/2017 1116   CL 103 02/26/2017 1116   CO2 24 02/26/2017 1116   GLUCOSE 142 (H) 02/26/2017 1116   GLUCOSE 148 (H) 07/19/2016 1453   GLUCOSE 135 09/08/2008   BUN 22 02/26/2017 1116   CREATININE 0.84 02/26/2017 1116   CALCIUM 9.6 02/26/2017 1116   GFRNONAA 69 02/26/2017 1116   GFRAA 80 02/26/2017 1116   Lab Results  Component Value Date   HGBA1C 6.8 05/06/2017   HGBA1C 6.1 02/08/2017   HGBA1C 7.8 (H) 11/21/2016   HGBA1C 7.0 09/27/2016   HGBA1C 7.5 07/05/2016   Lab Results  Component Value Date   INSULIN 76.3 (H) 11/21/2016   CBC    Component Value Date/Time   WBC 7.1 02/26/2017 1054   WBC 8.2 07/19/2016 1453   RBC 5.42 (H)  02/26/2017 1054   RBC 5.77 (H) 07/19/2016 1453   HGB 10.8 (L) 02/26/2017 1054   HCT 33.3 (L) 02/26/2017 1054   PLT 270 11/21/2016 1113   MCV 61 (L) 02/26/2017 1054   MCH 19.9 (L) 02/26/2017 1054   MCH 19.8 (L) 02/17/2014 1705   MCHC 32.4 02/26/2017 1054   MCHC 31.6 07/19/2016 1453   RDW 15.9 (H) 02/26/2017 1054  LYMPHSABS 1.2 02/26/2017 1054   MONOABS 0.7 07/19/2016 1453   EOSABS 0.1 02/26/2017 1054   BASOSABS 0.0 02/26/2017 1054   Iron/TIBC/Ferritin/ %Sat    Component Value Date/Time   IRON 137 05/29/2016 1116   FERRITIN 106.1 05/29/2016 1116   IRONPCTSAT 38.4 05/29/2016 1116   Lipid Panel     Component Value Date/Time   CHOL 156 11/21/2016 1113   TRIG 104 11/21/2016 1113   HDL 44 11/21/2016 1113   CHOLHDL 4 04/13/2016 0804   VLDL 20.8 04/13/2016 0804   LDLCALC 91 11/21/2016 1113   LDLDIRECT 98.0 05/24/2014 1343   Hepatic Function Panel     Component Value Date/Time   PROT 6.9 02/26/2017 1116   ALBUMIN 3.9 02/26/2017 1116   AST 34 02/26/2017 1116   ALT 33 (H) 02/26/2017 1116   ALKPHOS 71 02/26/2017 1116   BILITOT 0.7 02/26/2017 1116   BILIDIR 0.2 04/13/2016 0804   IBILI 0.5 02/17/2014 1705      Component Value Date/Time   TSH 0.90 05/06/2017 1350   TSH 5.170 (H) 02/26/2017 1116   TSH 2.560 11/21/2016 1113    ASSESSMENT AND PLAN: Type 2 diabetes mellitus without complication, without long-term current use of insulin (HCC)  Class 1 obesity with serious comorbidity and body mass index (BMI) of 32.0 to 32.9 in adult, unspecified obesity type  PLAN:  Diabetes II Yomaris has been given extensive diabetes education by myself today including ideal fasting and post-prandial blood glucose readings, individual ideal Hgb A1c goals and hypoglycemia prevention. We discussed the importance of good blood sugar control to decrease the likelihood of diabetic complications such as nephropathy, neuropathy, limb loss, blindness, coronary artery disease, and death. We discussed  the importance of intensive lifestyle modification including diet, exercise and weight loss as the first line treatment for diabetes. Brizza agrees to continue her diabetes medications and will follow up at the agreed upon time. Jessice will follow up with her endocrinologist.  We spent > than 50% of the 15 minute visit on the counseling as documented in the note.  Obesity Shametra is currently in the action stage of change. As such, her goal is to continue with weight loss efforts She has agreed to follow the Pescatarian eating plan Tessah has been instructed to work up to a goal of 150 minutes of combined cardio and strengthening exercise per week for weight loss and overall health benefits. We discussed the following Behavioral Modification Strategies today: increasing lean protein intake and keeping healthy foods in the home.  Raeli has agreed to follow up with our clinic in 2 to 3 weeks. She was informed of the importance of frequent follow up visits to maximize her success with intensive lifestyle modifications for her multiple health conditions.   OBESITY BEHAVIORAL INTERVENTION VISIT  Today's visit was # 12 out of 22.  Starting weight: 198 lbs Starting date: 11/21/16 Today's weight : 192 lbs Today's date: 06/12/2017 Total lbs lost to date: 6 (Patients must lose 7 lbs in the first 6 months to continue with counseling)   ASK: We discussed the diagnosis of obesity with [name] today and Joyclyn agreed to give [name] permission to discuss obesity behavioral modification therapy today.  ASSESS: Dyna has the diagnosis of obesity and her BMI today is 31.95 Caberfae is in the action stage of change   ADVISE: Mozelle was educated on the multiple health risks of obesity as well as the benefit of weight loss to improve her health. She was advised  of the need for long term treatment and the importance of lifestyle modifications.  AGREE: Multiple dietary modification options and treatment options were  discussed and  Zanasia agreed to the above obesity treatment plan.   Corey Skains, am acting as transcriptionist for [employer], PA-C I, Lacy Duverney [facility], have reviewed this note and agree with its content.

## 2017-06-14 ENCOUNTER — Ambulatory Visit (INDEPENDENT_AMBULATORY_CARE_PROVIDER_SITE_OTHER): Payer: Medicare Other | Admitting: Adult Health

## 2017-06-14 ENCOUNTER — Encounter: Payer: Self-pay | Admitting: Adult Health

## 2017-06-14 VITALS — BP 118/60 | Temp 98.6°F | Ht 64.0 in | Wt 195.0 lb

## 2017-06-14 DIAGNOSIS — Z23 Encounter for immunization: Secondary | ICD-10-CM | POA: Diagnosis not present

## 2017-06-14 DIAGNOSIS — Z0000 Encounter for general adult medical examination without abnormal findings: Secondary | ICD-10-CM | POA: Diagnosis not present

## 2017-06-14 DIAGNOSIS — E11649 Type 2 diabetes mellitus with hypoglycemia without coma: Secondary | ICD-10-CM

## 2017-06-14 DIAGNOSIS — F411 Generalized anxiety disorder: Secondary | ICD-10-CM

## 2017-06-14 DIAGNOSIS — E785 Hyperlipidemia, unspecified: Secondary | ICD-10-CM | POA: Diagnosis not present

## 2017-06-14 DIAGNOSIS — I10 Essential (primary) hypertension: Secondary | ICD-10-CM | POA: Diagnosis not present

## 2017-06-14 DIAGNOSIS — E6609 Other obesity due to excess calories: Secondary | ICD-10-CM | POA: Diagnosis not present

## 2017-06-14 DIAGNOSIS — Z794 Long term (current) use of insulin: Secondary | ICD-10-CM

## 2017-06-14 DIAGNOSIS — E038 Other specified hypothyroidism: Secondary | ICD-10-CM

## 2017-06-14 DIAGNOSIS — Z6832 Body mass index (BMI) 32.0-32.9, adult: Secondary | ICD-10-CM

## 2017-06-14 LAB — COMPREHENSIVE METABOLIC PANEL
ALT: 32 U/L (ref 0–35)
AST: 26 U/L (ref 0–37)
Albumin: 3.9 g/dL (ref 3.5–5.2)
Alkaline Phosphatase: 73 U/L (ref 39–117)
BUN: 26 mg/dL — ABNORMAL HIGH (ref 6–23)
CO2: 28 mEq/L (ref 19–32)
Calcium: 10.2 mg/dL (ref 8.4–10.5)
Chloride: 100 mEq/L (ref 96–112)
Creatinine, Ser: 0.8 mg/dL (ref 0.40–1.20)
GFR: 74.61 mL/min (ref >60.00)
Glucose, Bld: 175 mg/dL — ABNORMAL HIGH (ref 70–99)
Potassium: 4.1 mEq/L (ref 3.5–5.1)
Sodium: 137 mEq/L (ref 135–145)
Total Bilirubin: 0.7 mg/dL (ref 0.2–1.2)
Total Protein: 7 g/dL (ref 6.0–8.3)

## 2017-06-14 LAB — CBC WITH DIFFERENTIAL/PLATELET
Basophils Absolute: 0.1 10*3/uL (ref 0.0–0.1)
Basophils Relative: 0.9 % (ref 0.0–3.0)
Eosinophils Absolute: 0.3 10*3/uL (ref 0.0–0.7)
Eosinophils Relative: 3.5 % (ref 0.0–5.0)
HCT: 37.3 % (ref 36.0–46.0)
Hemoglobin: 11.9 g/dL — ABNORMAL LOW (ref 12.0–15.0)
Lymphocytes Relative: 23.7 % (ref 12.0–46.0)
Lymphs Abs: 1.9 10*3/uL (ref 0.7–4.0)
MCHC: 31.9 g/dL (ref 30.0–36.0)
MCV: 61.8 fl — ABNORMAL LOW (ref 78.0–100.0)
Monocytes Absolute: 0.6 10*3/uL (ref 0.1–1.0)
Monocytes Relative: 7.3 % (ref 3.0–12.0)
Neutro Abs: 5.3 10*3/uL (ref 1.4–7.7)
Neutrophils Relative %: 64.6 % (ref 43.0–77.0)
Platelets: 261 10*3/uL (ref 150.0–400.0)
RBC: 6.04 Mil/uL — ABNORMAL HIGH (ref 3.87–5.11)
RDW: 14.7 % (ref 11.5–15.5)
WBC: 8.2 10*3/uL (ref 4.0–10.5)

## 2017-06-14 LAB — LIPID PANEL
Cholesterol: 159 mg/dL (ref 0–200)
HDL: 41.2 mg/dL (ref >39.00)
LDL Cholesterol: 93 mg/dL (ref 0–99)
NonHDL: 117.36
Total CHOL/HDL Ratio: 4
Triglycerides: 123 mg/dL (ref 0.0–149.0)
VLDL: 24.6 mg/dL (ref 0.0–40.0)

## 2017-06-14 NOTE — Progress Notes (Signed)
Subjective:    Patient ID: Breanna Webster, female    DOB: May 18, 1945, 72 y.o.   MRN: [id number]  HPI  Patient presents for yearly preventative medicine examination. She is a pleasant 72 year old female who  has a past medical history of Arthritis, Carotid artery stenosis, Colon polyps, Diabetes mellitus, Diverticulitis, Diverticulosis, GERD (gastroesophageal reflux disease), Grave's disease, Heart murmur, History of colonic polyps (05/22/2017), Hyperlipidemia, Hypertension, IBS (irritable bowel syndrome), Osteopenia, and Thalassemia minor.  He has a history of hypertension for which she takes a beta blocker,  HCTZ, Cozaar BP Readings from Last 3 Encounters:  06/14/17 118/60  06/12/17 125/76  05/27/17 117/69   History of diabetes mellitus for which she uses NPH insulin 80 units.  She is followed by endocrinology.  She states fasting blood sugar ranged between 140 and 180s, postprandial as high as 270s.  She denies any hypoglycemic events.   Lab Results  Component Value Date   HGBA1C 6.8 05/06/2017   He has a history of GERD for which he takes Dexilant 30  mg tabs.  She is seen by GI  She is currently taking 175 mcg of Synthroid for hypothyroidism.  Last TSH in January was normal  She takes Lexapro for anxiety.She feels stable on this  All immunizations and health maintenance protocols were reviewed with the patient and needed orders were placed. Needs Prevnar   Appropriate screening laboratory values were ordered for the patient including screening of hyperlipidemia, renal function and hepatic function.  Medication reconciliation,  past medical history, social history, problem list and allergies were reviewed in detail with the patient  Goals were established with regard to weight loss, exercise, and  diet in compliance with medications.  He has been followed by medical weight loss center.  Per their last note on 06/12/2017 Breanna Webster has lost approximately 6 pounds.  She is not following  meal plan about 75% of the time, unfortunately she does not exercise.  End of life planning was discussed.  She is up-to-date on her colonoscopy and bone density screen. She is due for her mammogram. She has had her routine dental and vision exams   Review of Systems  Constitutional: Negative.   HENT: Negative.   Eyes: Negative.   Respiratory: Negative.   Cardiovascular: Negative.   Gastrointestinal: Negative.   Endocrine: Negative.   Genitourinary: Negative.   Musculoskeletal: Positive for arthralgias and back pain.  Skin: Negative.   Allergic/Immunologic: Negative.   Neurological: Negative.   Hematological: Negative.   Psychiatric/Behavioral: Negative.    Past Medical History:  Diagnosis Date  . Arthritis   . Carotid artery stenosis    Mild  . Colon polyps   . Diabetes mellitus   . Diverticulitis   . Diverticulosis   . GERD (gastroesophageal reflux disease)   . Grave's disease   . Heart murmur    mitral prolapse  . History of colonic polyps 05/22/2017  . Hyperlipidemia   . Hypertension   . IBS (irritable bowel syndrome)   . Osteopenia   . Thalassemia minor     Social History   Socioeconomic History  . Marital status: Married    Spouse name: Not on file  . Number of children: 2  . Years of education: Not on file  . Highest education level: Not on file  Social Needs  . Financial resource strain: Not on file  . Food insecurity - worry: Not on file  . Food insecurity - inability: Not on file  .  Transportation needs - medical: Not on file  . Transportation needs - non-medical: Not on file  Occupational History  . Occupation: Retired  Tobacco Use  . Smoking status: Never Smoker  . Smokeless tobacco: Never Used  Substance and Sexual Activity  . Alcohol use: No    Alcohol/week: 0.0 oz  . Drug use: No  . Sexual activity: Not on file  Other Topics Concern  . Not on file  Social History Narrative   Lives with husband in a one story home.     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   Education: college.    Past Surgical History:  Procedure Laterality Date  . CHOLECYSTECTOMY    . HIATAL HERNIA REPAIR    . HYSTEROSCOPY     fibroids  . laparoscopy     fibroids    Family History  Adopted: Yes  Problem Relation Age of Onset  . Healthy Son        x 2  . Headache Unknown        Cluster headaches  . Colon cancer Neg Hx   . Pancreatic cancer Neg Hx   . Rectal cancer Neg Hx   . Stomach cancer Neg Hx     Allergies  Allergen Reactions  . Statins Other (See Comments)    Muscle aches    Current Outpatient Medications on File Prior to Visit  Medication Sig Dispense Refill  . aspirin 81 MG tablet Take 81 mg by mouth daily.      . bisoprolol (ZEBETA) 5 MG tablet Take 1 tablet (5 mg total) by mouth daily. 90 tablet 3  . Dexlansoprazole 30 MG capsule Take 1 capsule (30 mg total) by mouth daily. 30 capsule 1  . escitalopram (LEXAPRO) 20 MG tablet take 1 tablet by mouth once daily 90 tablet 0  . glucose blood (ONETOUCH VERIO) test strip Use to check blood sugar 2 times per day. Dx Code: E11.8 200 each 2  . hydrochlorothiazide (HYDRODIURIL) 12.5 MG tablet Take 1 tablet (12.5 mg total) by mouth daily. 90 tablet 3  . insulin NPH Human (NOVOLIN N RELION) 100 UNIT/ML injection Inject 0.7 mLs (70 Units total) into the skin every morning. 30 mL 2  . Insulin Syringe-Needle U-100 (BD INSULIN SYRINGE U/F) 31G X 5/16" 1 ML MISC USE TO INJECT INSULIN ONCE DAILY 90 each 2  . Insulin Syringe-Needle U-100 (INSULIN SYRINGE .5CC/30GX1/2") 30G X 1/2" 0.5 ML MISC Use to inject insulin 2 times per day. 100 each 2  . levothyroxine (SYNTHROID, LEVOTHROID) 175 MCG tablet Take 1 tablet (175 mcg total) daily before breakfast by mouth. 90 tablet 2  . losartan (COZAAR) 50 MG tablet take 1 tablet by mouth once daily 90 tablet 1  . Multiple Vitamin (MULITIVITAMIN WITH MINERALS) TABS Take 1 tablet  by mouth daily.    . nortriptyline (PAMELOR) 25 MG capsule Take 1 capsule (25 mg total) by mouth at bedtime. 90 capsule 3  . NOVOLIN N RELION 100 UNIT/ML injection INJECT 1.3MLS (130 UNITS TOTAL) INTO THE SKIN EVERY MORNING 5 vial 11  . ONETOUCH DELICA LANCETS 50Y MISC Use to check blood sugar 2 times per day. Dx Code: E11.8 200 each 1   No current facility-administered medications on file prior to visit.     BP 118/60 (BP Location: Left Arm)   Temp 98.6 F (37 C) (Oral)   Ht 5' 4"  (1.626 m) Comment: WITHOUT SHOES  Wt 195 lb (88.5 kg)   BMI 33.47 kg/m       Objective:   Physical Exam  Constitutional: She is oriented to person, place, and time. She appears well-developed and well-nourished. No distress.  HENT:  Head: Normocephalic and atraumatic.  Right Ear: External ear normal.  Left Ear: External ear normal.  Nose: Nose normal.  Mouth/Throat: Oropharynx is clear and moist. No oropharyngeal exudate.  Eyes: Conjunctivae and EOM are normal. Pupils are equal, round, and reactive to light. Right eye exhibits no discharge. Left eye exhibits no discharge. No scleral icterus.  Neck: Normal range of motion. Neck supple. No JVD present. No tracheal deviation present. No thyromegaly present.  Cardiovascular: Normal rate, regular rhythm and intact distal pulses. Exam reveals no gallop and no friction rub.  Murmur heard. Pulmonary/Chest: Effort normal and breath sounds normal. No stridor. No respiratory distress. She has no wheezes. She has no rales. She exhibits no tenderness.  Abdominal: Soft. Bowel sounds are normal. She exhibits no distension and no mass. There is no tenderness. There is no rebound and no guarding.  Genitourinary:  Genitourinary Comments: Refused breast exam   Musculoskeletal: Normal range of motion. She exhibits no edema, tenderness or deformity.  Lymphadenopathy:    She has no cervical adenopathy.  Neurological: She is alert and oriented to person, place, and time.  She has normal reflexes. She displays normal reflexes. No cranial nerve deficit. She exhibits normal muscle tone. Coordination normal.  Skin: Skin is warm and dry. No rash noted. She is not diaphoretic. No erythema. No pallor.  Psychiatric: She has a normal mood and affect. Her behavior is normal. Judgment and thought content normal.  Nursing note and vitals reviewed.     Assessment & Plan:  1. Routine general medical examination at a health care facility - Follow up in one year or sooner if needed - Encouraged heart healthy diet and exercise  - CBC with Differential/Platelet - Lipid panel - CMP  2. Essential hypertension, benign - Well controlled. She would like to try coming off HCTZ. I am ok with this. Advised monitoring BP at home on a routine basis.  - CBC with Differential/Platelet - Lipid panel - CMP  3. Other specified hypothyroidism - Follow up with endocrinology   4. Type 2 diabetes mellitus with hypoglycemia without coma, with long-term current use of insulin ([place]) - Follow up with Endocrinology  - CBC with Differential/Platelet - Lipid panel - CMP  5. Dyslipidemia - Consider statin  - CBC with Differential/Platelet - Lipid panel - CMP  6. Class 1 obesity due to excess calories with serious comorbidity and body mass index (BMI) of 32.0 to 32.9 in adult - Continue with POC by weight loss clinic  - Encouraged exercise and heart healthy diet   7. Anxiety state -Continue with Lexapro   8. Need for vaccination against Streptococcus pneumoniae  - Pneumococcal conjugate vaccine 13-valent  [name], NP

## 2017-06-15 ENCOUNTER — Other Ambulatory Visit: Payer: Self-pay | Admitting: Adult Health

## 2017-06-18 NOTE — Telephone Encounter (Signed)
DENIED.  PT IS NO LONGER TAKING THIS MEDICATION.  MESSAGE SENT TO THE PHARMACY.

## 2017-06-19 ENCOUNTER — Other Ambulatory Visit: Payer: Self-pay | Admitting: Adult Health

## 2017-06-20 ENCOUNTER — Ambulatory Visit: Payer: Self-pay | Admitting: Genetics

## 2017-06-20 ENCOUNTER — Encounter: Payer: Self-pay | Admitting: Genetics

## 2017-06-20 DIAGNOSIS — Z86010 Personal history of colonic polyps: Secondary | ICD-10-CM

## 2017-06-20 DIAGNOSIS — Z1379 Encounter for other screening for genetic and chromosomal anomalies: Secondary | ICD-10-CM | POA: Insufficient documentation

## 2017-06-20 NOTE — Progress Notes (Signed)
HPI: Breanna Webster was previously seen in the [facility] on 05/22/2017 due to a personal history of colon polyps and concerns regarding a hereditary predisposition to cancer. Please refer to our prior cancer genetics clinic note for more information regarding Breanna Webster's medical, social and family histories, and our assessment and recommendations, at the time. Breanna Webster recent genetic test results were disclosed to her, as well as recommendations warranted by these results. These results and recommendations are discussed in more detail below.  CANCER HISTORY:   No history exists.     FAMILY HISTORY:  We obtained a detailed, 4-generation family history.  Significant diagnoses are listed below: Family History  Adopted: Yes  Problem Relation Age of Onset  . Healthy Son        x 2  . Headache Unknown        Cluster headaches  . Colon cancer Neg Hx   . Pancreatic cancer Neg Hx   . Rectal cancer Neg Hx   . Stomach cancer Neg Hx     Breanna Webster is adopted and therefore has very limited information about her family history.  She has been told that her parents died in their 59's (mother due to heart failure and unk causes for her father).  She reports that she had about 8 siblings.  One brother died at 56 and one sister died due to complications of diabetes. She is not aware of any history of cancer.  Breanna Webster has 2 sons.  One is 100 and has no history of cancer/colon polyps and has had a colonoscopy.  This son has 2 daughters ages 82 and 43 with no history of cancer.  Breanna Webster other son is 50 with no history of cancer and no children.  He has not had a colonoscopy.   Breanna Webster is unaware of previous family history of genetic testing for hereditary cancer risks.  Breanna Webster had 4 and me ancestry genetic testing that reports 89% [place] ancestry and 1.8% Ashkenazi Jewish ancestry. There is no known consanguinity.  GENETIC TEST RESULTS: Genetic  testing performed through [facility]'s Common Hereditary Cancer Panel reported out on 05/28/2017 showed no pathogenic mutations. The Common Hereditary Cancer Panel offered by [facility] includes sequencing and/or deletion duplication testing of the following 47 genes: APC, ATM, AXIN2, BARD1, BMPR1A, BRCA1, BRCA2, BRIP1, CDH1, CDKN2A (p14ARF), CDKN2A (p16INK4a), CKD4, CHEK2, CTNNA1, DICER1, EPCAM (Deletion/duplication testing only), GREM1 (promoter region deletion/duplication testing only), KIT, MEN1, MLH1, MSH2, MSH3, MSH6, MUTYH, NBN, NF1, NHTL1, PALB2, PDGFRA, PMS2, POLD1, POLE, PTEN, RAD50, RAD51C, RAD51D, SDHB, SDHC, SDHD, SMAD4, SMARCA4. STK11, TP53, TSC1, TSC2, and VHL.  The following genes were evaluated for sequence changes only: SDHA and HOXB13 c.251G>A variant only..  The test report will be scanned into EPIC and will be located under the Molecular Pathology section of the Results Review tab.A portion of the result report is included below for reference.     We discussed with Breanna Webster that because current genetic testing is not perfect, it is possible there may be a gene mutation in one of these genes that current testing cannot detect, but that chance is small. We also discussed, that there could be another gene that has not yet been discovered, or that we have not yet tested, that is responsible for the cancer diagnoses in the family. Therefore, it is important to remain in touch with cancer genetics in the future so that we can continue to offer Breanna Webster the most up  to date genetic testing.   ADDITIONAL GENETIC TESTING: We discussed with Breanna Webster that there are other genes that are associated with increased cancer risk that can be analyzed. The laboratories that offer this testing look at these additional genes via a hereditary cancer gene panel. Should Breanna Webster wish to pursue additional genetic testing, we are happy to discuss and coordinate this testing, at any time.    CANCER  SCREENING RECOMMENDATIONS:  This negative genetic test simply tells [name] that we cannot yet define why Breanna Webster has had an increased number of colorectal polyps.  Breanna Webster's medical management and screening should be based on the prospect that she will likely form more colon polyps, and therefore, undergo more frequent colonoscopy screening at intervals determined by her GI providers.    It is possible that there could be genetic mutations that are undetectable by current technology, or genetic mutations in genes that have not been tested or identified to increase cancer/polyp risk. Other factors such as her personal and family history may still affect her cancer risk.  RECOMMENDATIONS FOR FAMILY MEMBERS: Individuals in this family might be at some increased risk of developing colon polyps, over the general population risk, simply due to the family history of colon polyps. All family members should inform their physicians about the family history of colon polpys so their doctors can make the most appropriate screening recommendations for them.   We recommended women in this family have a yearly mammogram beginning at age 33, or 47 years younger than the earliest onset of cancer, an annual clinical breast exam, and perform monthly breast self-exams. Women in this family should also have a gynecological exam as recommended by their primary provider. All family members should have a colonoscopy by age 81 or earlier based on family/personal history.    FOLLOW-UP: Lastly, we discussed with Breanna Webster that cancer genetics is a rapidly advancing field and it is possible that new genetic tests will be appropriate for her and/or her family members in the future. We encouraged her to remain in contact with cancer genetics on an annual basis so we can update her personal and family histories and let her know of advances in cancer genetics that may benefit this family.   Our contact number was  provided. Breanna Webster questions were answered to her satisfaction, and she knows she is welcome to call us at anytime with additional questions or concerns.   Ferol Luz, MS, [facility] Certified Genetic Counselor [email or web address]

## 2017-06-20 NOTE — Telephone Encounter (Signed)
DENIED.  PATIENT IS NO LONGER TAKING THIS MEDICATION.

## 2017-06-25 ENCOUNTER — Telehealth: Payer: Self-pay | Admitting: Gastroenterology

## 2017-06-25 NOTE — Telephone Encounter (Signed)
Error

## 2017-06-26 ENCOUNTER — Other Ambulatory Visit: Payer: Self-pay

## 2017-06-26 MED ORDER — PANTOPRAZOLE SODIUM 40 MG PO TBEC: 40.0000 mg | DELAYED_RELEASE_TABLET | Freq: Two times a day (BID) | ORAL | 3 refills | Status: DC

## 2017-06-26 NOTE — Progress Notes (Signed)
Dexilant too expensive.  Pt to try Pantoprazole BID per Dr. Havery Moros

## 2017-06-26 NOTE — Telephone Encounter (Signed)
She was previously on nexium twice daily which gave her a bad taste in her mouth. If Dexilant is too strong, we can try protonix 73m BID. Has she been on that before? If not, I think that would be a good option. Thanks

## 2017-06-26 NOTE — Telephone Encounter (Signed)
Spoke to pt. Relayed recommendations. She expressed understanding. Rx sent.

## 2017-06-26 NOTE — Telephone Encounter (Signed)
Jan, see below from note on 12/6 after her EGD. She had already been on nexium 4m BID and it didn't work and she thought it caused metallic taste. In this light I would not recommend it, she has already been on omeprazole, I am recommending pantoprazole       Notes recorded by AYetta Flock MD on 03/29/2017 at 1:09 PM EST Okay, I thought she was on Nexium 438monce daily. If she is taking it twice daily and it's not helping, why don't we try Dexilant 6049mnce daily and see if that helps. Can you give her a 30 day supply with 3 refills to start. Thanks ------  Notes recorded by FouDoristine CounterN on 03/28/2017 at 11:05 AM EST Patient advised of results and recommendations, she does want to see the genetic counselor. Referral to be placed. Recall in our system to contact patient for a 6 month office visit. Patient wanted to let Dr. ArmHavery Morosow that she has had a metallic taste in her mouth for the past couple of months. Nexium 40 mg BID, is not helping control her reflux symptoms. Please advise.

## 2017-06-29 ENCOUNTER — Other Ambulatory Visit: Payer: Self-pay | Admitting: Adult Health

## 2017-07-01 ENCOUNTER — Ambulatory Visit (INDEPENDENT_AMBULATORY_CARE_PROVIDER_SITE_OTHER): Payer: Medicare Other | Admitting: Physician Assistant

## 2017-07-01 ENCOUNTER — Other Ambulatory Visit: Payer: Self-pay | Admitting: Adult Health

## 2017-07-01 VITALS — BP 111/63 | HR 61 | Temp 97.8°F | Ht 64.0 in | Wt 189.0 lb

## 2017-07-01 DIAGNOSIS — Z794 Long term (current) use of insulin: Secondary | ICD-10-CM | POA: Diagnosis not present

## 2017-07-01 DIAGNOSIS — E669 Obesity, unspecified: Secondary | ICD-10-CM | POA: Diagnosis not present

## 2017-07-01 DIAGNOSIS — E1165 Type 2 diabetes mellitus with hyperglycemia: Secondary | ICD-10-CM | POA: Insufficient documentation

## 2017-07-01 DIAGNOSIS — Z6831 Body mass index (BMI) 31.0-31.9, adult: Secondary | ICD-10-CM | POA: Diagnosis not present

## 2017-07-01 NOTE — Progress Notes (Signed)
Office: [phone]  /  Fax: [phone]   HPI:   Chief Complaint: OBESITY Breanna Webster is here to discuss her progress with her obesity treatment plan. She is on the Pescatarian eating plan and is following her eating plan approximately 80 % of the time. She states she is walking when she can for 20-30 minutes. Breanna Webster continues to do well with weight loss. She has been incorporating more lean protein and states her hunger is well controlled.  Her weight is 189 lb (85.7 kg) today and has had a weight loss of 3 pounds over a period of 2 to 3 weeks since her last visit. She has lost 9 lbs since starting treatment with [name].  Diabetes II Breanna Webster has a diagnosis of diabetes type II. [name] states fasting BGs range in between 125 and 150 and post prandial average in 200's. She denies any hypoglycemic episodes. She is on Novolin. Last A1c was 6.8 on 05/06/17. She has been working on intensive lifestyle modifications including diet, exercise, and weight loss to help control her blood glucose levels.  ALLERGIES: Allergies  Allergen Reactions  . Statins Other (See Comments)    Muscle aches    MEDICATIONS: Current Outpatient Medications on File Prior to Visit  Medication Sig Dispense Refill  . aspirin 81 MG tablet Take 81 mg by mouth daily.      . bisoprolol (ZEBETA) 5 MG tablet Take 1 tablet (5 mg total) by mouth daily. 90 tablet 3  . cholecalciferol (VITAMIN D) 1000 units tablet Take 1,000 Units by mouth daily.    Marland Kitchen Dexlansoprazole 30 MG capsule Take 1 capsule (30 mg total) by mouth daily. 30 capsule 1  . escitalopram (LEXAPRO) 20 MG tablet take 1 tablet by mouth once daily 90 tablet 0  . glucose blood (ONETOUCH VERIO) test strip Use to check blood sugar 2 times per day. Dx Code: E11.8 200 each 2  . insulin NPH Human (NOVOLIN N RELION) 100 UNIT/ML injection Inject 0.7 mLs (70 Units total) into the skin every morning. 30 mL 2  . Insulin Syringe-Needle U-100 (BD INSULIN SYRINGE U/F) 31G X 5/16" 1 ML MISC USE  TO INJECT INSULIN ONCE DAILY 90 each 2  . Insulin Syringe-Needle U-100 (INSULIN SYRINGE .5CC/30GX1/2") 30G X 1/2" 0.5 ML MISC Use to inject insulin 2 times per day. 100 each 2  . levothyroxine (SYNTHROID, LEVOTHROID) 175 MCG tablet Take 1 tablet (175 mcg total) daily before breakfast by mouth. 90 tablet 2  . losartan (COZAAR) 50 MG tablet take 1 tablet by mouth once daily 90 tablet 1  . Multiple Vitamin (MULITIVITAMIN WITH MINERALS) TABS Take 1 tablet by mouth daily.    . nortriptyline (PAMELOR) 25 MG capsule Take 1 capsule (25 mg total) by mouth at bedtime. 90 capsule 3  . NOVOLIN N RELION 100 UNIT/ML injection INJECT 1.3MLS (130 UNITS TOTAL) INTO THE SKIN EVERY MORNING 5 vial 11  . ONETOUCH DELICA LANCETS 01U MISC Use to check blood sugar 2 times per day. Dx Code: E11.8 200 each 1  . pantoprazole (PROTONIX) 40 MG tablet Take 1 tablet (40 mg total) by mouth 2 (two) times daily. 60 tablet 3  . vitamin E 400 UNIT capsule Take 400 Units by mouth daily.     No current facility-administered medications on file prior to visit.     PAST MEDICAL HISTORY: Past Medical History:  Diagnosis Date  . Arthritis   . Carotid artery stenosis    Mild  . Colon polyps   .  Diabetes mellitus   . Diverticulitis   . Diverticulosis   . GERD (gastroesophageal reflux disease)   . Grave's disease   . Heart murmur    mitral prolapse  . History of colonic polyps 05/22/2017  . Hyperlipidemia   . Hypertension   . IBS (irritable bowel syndrome)   . Osteopenia   . Thalassemia minor     PAST SURGICAL HISTORY: Past Surgical History:  Procedure Laterality Date  . CHOLECYSTECTOMY    . HIATAL HERNIA REPAIR    . HYSTEROSCOPY     fibroids  . laparoscopy     fibroids    SOCIAL HISTORY: Social History   Tobacco Use  . Smoking status: Never Smoker  . Smokeless tobacco: Never Used  Substance Use Topics  . Alcohol use: No    Alcohol/week: 0.0 oz  . Drug use: No    FAMILY HISTORY: Family History    Adopted: Yes  Problem Relation Age of Onset  . Healthy Son        x 2  . Headache Unknown        Cluster headaches  . Colon cancer Neg Hx   . Pancreatic cancer Neg Hx   . Rectal cancer Neg Hx   . Stomach cancer Neg Hx     ROS: Review of Systems  Constitutional: Positive for weight loss.  Endo/Heme/Allergies:       Negative hypoglycemia    PHYSICAL EXAM: Blood pressure 111/63, pulse 61, temperature 97.8 F (36.6 C), temperature source Oral, height 5' 4"  (1.626 m), weight 189 lb (85.7 kg), SpO2 95 %. Body mass index is 32.44 kg/m. Physical Exam  Constitutional: She is oriented to person, place, and time. She appears well-developed and well-nourished.  Cardiovascular: Normal rate.  Pulmonary/Chest: Effort normal.  Musculoskeletal: Normal range of motion.  Neurological: She is oriented to person, place, and time.  Skin: Skin is warm and dry.  Psychiatric: She has a normal mood and affect. Her behavior is normal.  Vitals reviewed.   RECENT LABS AND TESTS: BMET    Component Value Date/Time   NA 137 06/14/2017 0826   NA 141 02/26/2017 1116   K 4.1 06/14/2017 0826   CL 100 06/14/2017 0826   CO2 28 06/14/2017 0826   GLUCOSE 175 (H) 06/14/2017 0826   GLUCOSE 135 09/08/2008   BUN 26 (H) 06/14/2017 0826   BUN 22 02/26/2017 1116   CREATININE 0.80 06/14/2017 0826   CALCIUM 10.2 06/14/2017 0826   GFRNONAA 69 02/26/2017 1116   GFRAA 80 02/26/2017 1116   Lab Results  Component Value Date   HGBA1C 6.8 05/06/2017   HGBA1C 6.1 02/08/2017   HGBA1C 7.8 (H) 11/21/2016   HGBA1C 7.0 09/27/2016   HGBA1C 7.5 07/05/2016   Lab Results  Component Value Date   INSULIN 76.3 (H) 11/21/2016   CBC    Component Value Date/Time   WBC 8.2 06/14/2017 0826   RBC 6.04 (H) 06/14/2017 0826   HGB 11.9 (L) 06/14/2017 0826   HGB 10.8 (L) 02/26/2017 1054   HCT 37.3 06/14/2017 0826   HCT 33.3 (L) 02/26/2017 1054   PLT 261.0 06/14/2017 0826   PLT 270 11/21/2016 1113   MCV 61.8 Repeated  and verified X2. (L) 06/14/2017 0826   MCV 61 (L) 02/26/2017 1054   MCH 19.9 (L) 02/26/2017 1054   MCH 19.8 (L) 02/17/2014 1705   MCHC 31.9 06/14/2017 0826   RDW 14.7 06/14/2017 0826   RDW 15.9 (H) 02/26/2017 1054   LYMPHSABS 1.9  06/14/2017 0826   LYMPHSABS 1.2 02/26/2017 1054   MONOABS 0.6 06/14/2017 0826   EOSABS 0.3 06/14/2017 0826   EOSABS 0.1 02/26/2017 1054   BASOSABS 0.1 06/14/2017 0826   BASOSABS 0.0 02/26/2017 1054   Iron/TIBC/Ferritin/ %Sat    Component Value Date/Time   IRON 137 05/29/2016 1116   FERRITIN 106.1 05/29/2016 1116   IRONPCTSAT 38.4 05/29/2016 1116   Lipid Panel     Component Value Date/Time   CHOL 159 06/14/2017 0826   CHOL 156 11/21/2016 1113   TRIG 123.0 06/14/2017 0826   HDL 41.20 06/14/2017 0826   HDL 44 11/21/2016 1113   CHOLHDL 4 06/14/2017 0826   VLDL 24.6 06/14/2017 0826   LDLCALC 93 06/14/2017 0826   LDLCALC 91 11/21/2016 1113   LDLDIRECT 98.0 05/24/2014 1343   Hepatic Function Panel     Component Value Date/Time   PROT 7.0 06/14/2017 0826   PROT 6.9 02/26/2017 1116   ALBUMIN 3.9 06/14/2017 0826   ALBUMIN 3.9 02/26/2017 1116   AST 26 06/14/2017 0826   ALT 32 06/14/2017 0826   ALKPHOS 73 06/14/2017 0826   BILITOT 0.7 06/14/2017 0826   BILITOT 0.7 02/26/2017 1116   BILIDIR 0.2 04/13/2016 0804   IBILI 0.5 02/17/2014 1705      Component Value Date/Time   TSH 0.90 05/06/2017 1350   TSH 5.170 (H) 02/26/2017 1116   TSH 2.560 11/21/2016 1113    ASSESSMENT AND PLAN: Type 2 diabetes mellitus with hyperglycemia, with long-term current use of insulin (HCC)  Class 1 obesity with serious comorbidity and body mass index (BMI) of 31.0 to 31.9 in adult, unspecified obesity type  PLAN:  Diabetes II Sophiagrace has been given extensive diabetes education by myself today including ideal fasting and post-prandial blood glucose readings, individual ideal Hgb A1c goals and hypoglycemia prevention. We discussed the importance of good blood sugar  control to decrease the likelihood of diabetic complications such as nephropathy, neuropathy, limb loss, blindness, coronary artery disease, and death. We discussed the importance of intensive lifestyle modification including diet, exercise and weight loss as the first line treatment for diabetes. Shanita agrees to continue her diabetes medications and she agrees to follow up with our clinic in 2 weeks.  We spent > than 50% of the 15 minute visit on the counseling as documented in the note.  Obesity Danielle is currently in the action stage of change. As such, her goal is to continue with weight loss efforts She has agreed to follow the Category 3 plan Elenie has been instructed to work up to a goal of 150 minutes of combined cardio and strengthening exercise per week for weight loss and overall health benefits. We discussed the following Behavioral Modification Strategies today: increasing lean protein intake and planning for success   Kailany has agreed to follow up with our clinic in 2 weeks. She was informed of the importance of frequent follow up visits to maximize her success with intensive lifestyle modifications for her multiple health conditions.   OBESITY BEHAVIORAL INTERVENTION VISIT  Today's visit was # 13 out of 22.  Starting weight: 198 lbs Starting date: 11/21/16 Today's weight : 189 lbs  Today's date: 07/01/2017 Total lbs lost to date: 9 (Patients must lose 7 lbs in the first 6 months to continue with counseling)   ASK: We discussed the diagnosis of obesity with [name] today and Rielly agreed to give [name] permission to discuss obesity behavioral modification therapy today.  ASSESS: Katheen has the diagnosis  of obesity and her BMI today is 32.43 Malashia is in the action stage of change   ADVISE: Jeanita was educated on the multiple health risks of obesity as well as the benefit of weight loss to improve her health. She was advised of the need for long term treatment and the importance  of lifestyle modifications.  AGREE: Multiple dietary modification options and treatment options were discussed and  Deborahann agreed to the above obesity treatment plan.   Wilhemena Durie, am acting as transcriptionist for Lacy Duverney, PA-C I, Lacy Duverney [facility], have reviewed this note and agree with its content.

## 2017-07-02 NOTE — Telephone Encounter (Signed)
DENIED.  PT IS NO LONGER TAKING MEDICATION.  MESSAGE SENT TO THE PHARMACY TO DISCONTINUE.

## 2017-07-03 NOTE — Telephone Encounter (Signed)
Sent to the pharmacy by e-scribe. 

## 2017-07-15 ENCOUNTER — Ambulatory Visit (INDEPENDENT_AMBULATORY_CARE_PROVIDER_SITE_OTHER): Payer: Medicare Other | Admitting: Physician Assistant

## 2017-07-15 VITALS — BP 146/77 | HR 63 | Temp 98.6°F | Ht 64.0 in | Wt 195.0 lb

## 2017-07-15 DIAGNOSIS — E119 Type 2 diabetes mellitus without complications: Secondary | ICD-10-CM | POA: Diagnosis not present

## 2017-07-15 DIAGNOSIS — I10 Essential (primary) hypertension: Secondary | ICD-10-CM | POA: Diagnosis not present

## 2017-07-15 DIAGNOSIS — Z9189 Other specified personal risk factors, not elsewhere classified: Secondary | ICD-10-CM | POA: Diagnosis not present

## 2017-07-15 DIAGNOSIS — Z6833 Body mass index (BMI) 33.0-33.9, adult: Secondary | ICD-10-CM

## 2017-07-15 DIAGNOSIS — Z794 Long term (current) use of insulin: Secondary | ICD-10-CM

## 2017-07-15 DIAGNOSIS — E669 Obesity, unspecified: Secondary | ICD-10-CM

## 2017-07-15 MED ORDER — HYDROCHLOROTHIAZIDE 12.5 MG PO TABS: 12.5000 mg | ORAL_TABLET | Freq: Every day | ORAL | 0 refills | Status: DC

## 2017-07-15 NOTE — Progress Notes (Signed)
Office: [phone]  /  Fax: [phone]   HPI:   Chief Complaint: OBESITY Breanna Webster is here to discuss her progress with her obesity treatment plan. She is on the Category 3 plan and is following her eating plan approximately 80 % of the time. She states she is walking for 30 minutes 2 times per week. Breanna Webster is retaining significant amounts of fluid, she noted after stopping her fluid pills. She states she is more mindful of her eating.  Her weight is 195 lb (88.5 kg) today and has gained 6 pounds since her last visit. She has lost 3 lbs since starting treatment with [name].  Hypertension Breanna Webster is a 72 y.o. female with hypertension. Breanna Webster's blood pressure is elevated. Also, she is with +2 edema on bilateral feet. She states she stopped taking diuretic because she did not see the need to take it. She denies any hypotension or side effects on the hydrochlorothiazide, also on bisoprolol and losartan. She denies chest pain or shortness of breath on exertion. She is working weight loss to help control her blood pressure with the goal of decreasing her risk of heart attack and stroke. Breanna Webster's blood pressure is not currently controlled.  Diabetes II Breanna Webster has a diagnosis of diabetes type II. Breanna Webster states fasting BGs range between 80's and 140's. She denies hypoglycemia. She is advised to keep a tighter BGs log to further evaluate need for insulin adjustment. She has a Musician visit in April. Last A1c was 6.8 on 05/06/17. She has been working on intensive lifestyle modifications including diet, exercise, and weight loss to help control her blood glucose levels.  At risk for cardiovascular disease Breanna Webster is at a higher than average risk for cardiovascular disease due to obesity, hypertension, and diabetes II. She currently denies any chest pain.  ALLERGIES: Allergies  Allergen Reactions  . Statins Other (See Comments)    Muscle aches    MEDICATIONS: Current Outpatient Medications on File  Prior to Visit  Medication Sig Dispense Refill  . aspirin 81 MG tablet Take 81 mg by mouth daily.      . bisoprolol (ZEBETA) 5 MG tablet Take 1 tablet (5 mg total) by mouth daily. 90 tablet 3  . cholecalciferol (VITAMIN D) 1000 units tablet Take 1,000 Units by mouth daily.    Marland Kitchen Dexlansoprazole 30 MG capsule Take 1 capsule (30 mg total) by mouth daily. 30 capsule 1  . escitalopram (LEXAPRO) 20 MG tablet take 1 tablet by mouth once daily 90 tablet 0  . glucose blood (ONETOUCH VERIO) test strip Use to check blood sugar 2 times per day. Dx Code: E11.8 200 each 2  . insulin NPH Human (NOVOLIN N RELION) 100 UNIT/ML injection Inject 0.7 mLs (70 Units total) into the skin every morning. 30 mL 2  . Insulin Syringe-Needle U-100 (BD INSULIN SYRINGE U/F) 31G X 5/16" 1 ML MISC USE TO INJECT INSULIN ONCE DAILY 90 each 2  . Insulin Syringe-Needle U-100 (INSULIN SYRINGE .5CC/30GX1/2") 30G X 1/2" 0.5 ML MISC Use to inject insulin 2 times per day. 100 each 2  . levothyroxine (SYNTHROID, LEVOTHROID) 175 MCG tablet Take 1 tablet (175 mcg total) daily before breakfast by mouth. 90 tablet 2  . losartan (COZAAR) 50 MG tablet TAKE 1 TABLET BY MOUTH ONCE DAILY 90 tablet 3  . Multiple Vitamin (MULITIVITAMIN WITH MINERALS) TABS Take 1 tablet by mouth daily.    . nortriptyline (PAMELOR) 25 MG capsule Take 1 capsule (25 mg total) by mouth at bedtime.  90 capsule 3  . NOVOLIN N RELION 100 UNIT/ML injection INJECT 1.3MLS (130 UNITS TOTAL) INTO THE SKIN EVERY MORNING 5 vial 11  . ONETOUCH DELICA LANCETS 54S MISC Use to check blood sugar 2 times per day. Dx Code: E11.8 200 each 1  . pantoprazole (PROTONIX) 40 MG tablet Take 1 tablet (40 mg total) by mouth 2 (two) times daily. 60 tablet 3  . vitamin E 400 UNIT capsule Take 400 Units by mouth daily.     No current facility-administered medications on file prior to visit.     PAST MEDICAL HISTORY: Past Medical History:  Diagnosis Date  . Arthritis   . Carotid artery stenosis     Mild  . Colon polyps   . Diabetes mellitus   . Diverticulitis   . Diverticulosis   . GERD (gastroesophageal reflux disease)   . Grave's disease   . Heart murmur    mitral prolapse  . History of colonic polyps 05/22/2017  . Hyperlipidemia   . Hypertension   . IBS (irritable bowel syndrome)   . Osteopenia   . Thalassemia minor     PAST SURGICAL HISTORY: Past Surgical History:  Procedure Laterality Date  . CHOLECYSTECTOMY    . HIATAL HERNIA REPAIR    . HYSTEROSCOPY     fibroids  . laparoscopy     fibroids    SOCIAL HISTORY: Social History   Tobacco Use  . Smoking status: Never Smoker  . Smokeless tobacco: Never Used  Substance Use Topics  . Alcohol use: No    Alcohol/week: 0.0 oz  . Drug use: No    FAMILY HISTORY: Family History  Adopted: Yes  Problem Relation Age of Onset  . Healthy Son        x 2  . Headache Unknown        Cluster headaches  . Colon cancer Neg Hx   . Pancreatic cancer Neg Hx   . Rectal cancer Neg Hx   . Stomach cancer Neg Hx     ROS: Review of Systems  Constitutional: Negative for weight loss.  Respiratory: Negative for shortness of breath.   Cardiovascular: Negative for chest pain.  Endo/Heme/Allergies:       Negative hypoglycemia    PHYSICAL EXAM: Blood pressure (!) 146/77, pulse 63, temperature 98.6 F (37 C), temperature source Oral, height 5' 4"  (1.626 m), weight 195 lb (88.5 kg), SpO2 95 %. Body mass index is 33.47 kg/m. Physical Exam  Constitutional: She is oriented to person, place, and time. She appears well-developed and well-nourished.  Cardiovascular: Normal rate.  Pulmonary/Chest: Effort normal.  Musculoskeletal: Normal range of motion. She exhibits edema (+2 bilateral feet).  Neurological: She is oriented to person, place, and time.  Skin: Skin is warm and dry.  Psychiatric: She has a normal mood and affect. Her behavior is normal.  Vitals reviewed.   RECENT LABS AND TESTS: BMET    Component Value  Date/Time   NA 137 06/14/2017 0826   NA 141 02/26/2017 1116   K 4.1 06/14/2017 0826   CL 100 06/14/2017 0826   CO2 28 06/14/2017 0826   GLUCOSE 175 (H) 06/14/2017 0826   GLUCOSE 135 09/08/2008   BUN 26 (H) 06/14/2017 0826   BUN 22 02/26/2017 1116   CREATININE 0.80 06/14/2017 0826   CALCIUM 10.2 06/14/2017 0826   GFRNONAA 69 02/26/2017 1116   GFRAA 80 02/26/2017 1116   Lab Results  Component Value Date   HGBA1C 6.8 05/06/2017   HGBA1C 6.1  02/08/2017   HGBA1C 7.8 (H) 11/21/2016   HGBA1C 7.0 09/27/2016   HGBA1C 7.5 07/05/2016   Lab Results  Component Value Date   INSULIN 76.3 (H) 11/21/2016   CBC    Component Value Date/Time   WBC 8.2 06/14/2017 0826   RBC 6.04 (H) 06/14/2017 0826   HGB 11.9 (L) 06/14/2017 0826   HGB 10.8 (L) 02/26/2017 1054   HCT 37.3 06/14/2017 0826   HCT 33.3 (L) 02/26/2017 1054   PLT 261.0 06/14/2017 0826   PLT 270 11/21/2016 1113   MCV 61.8 Repeated and verified X2. (L) 06/14/2017 0826   MCV 61 (L) 02/26/2017 1054   MCH 19.9 (L) 02/26/2017 1054   MCH 19.8 (L) 02/17/2014 1705   MCHC 31.9 06/14/2017 0826   RDW 14.7 06/14/2017 0826   RDW 15.9 (H) 02/26/2017 1054   LYMPHSABS 1.9 06/14/2017 0826   LYMPHSABS 1.2 02/26/2017 1054   MONOABS 0.6 06/14/2017 0826   EOSABS 0.3 06/14/2017 0826   EOSABS 0.1 02/26/2017 1054   BASOSABS 0.1 06/14/2017 0826   BASOSABS 0.0 02/26/2017 1054   Iron/TIBC/Ferritin/ %Sat    Component Value Date/Time   IRON 137 05/29/2016 1116   FERRITIN 106.1 05/29/2016 1116   IRONPCTSAT 38.4 05/29/2016 1116   Lipid Panel     Component Value Date/Time   CHOL 159 06/14/2017 0826   CHOL 156 11/21/2016 1113   TRIG 123.0 06/14/2017 0826   HDL 41.20 06/14/2017 0826   HDL 44 11/21/2016 1113   CHOLHDL 4 06/14/2017 0826   VLDL 24.6 06/14/2017 0826   LDLCALC 93 06/14/2017 0826   LDLCALC 91 11/21/2016 1113   LDLDIRECT 98.0 05/24/2014 1343   Hepatic Function Panel     Component Value Date/Time   PROT 7.0 06/14/2017 0826    PROT 6.9 02/26/2017 1116   ALBUMIN 3.9 06/14/2017 0826   ALBUMIN 3.9 02/26/2017 1116   AST 26 06/14/2017 0826   ALT 32 06/14/2017 0826   ALKPHOS 73 06/14/2017 0826   BILITOT 0.7 06/14/2017 0826   BILITOT 0.7 02/26/2017 1116   BILIDIR 0.2 04/13/2016 0804   IBILI 0.5 02/17/2014 1705      Component Value Date/Time   TSH 0.90 05/06/2017 1350   TSH 5.170 (H) 02/26/2017 1116   TSH 2.560 11/21/2016 1113    ASSESSMENT AND PLAN: Essential hypertension - Plan: hydrochlorothiazide (HYDRODIURIL) 12.5 MG tablet  Type 2 diabetes mellitus without complication, with long-term current use of insulin (HCC)  At risk for heart disease  Class 1 obesity with serious comorbidity and body mass index (BMI) of 33.0 to 33.9 in adult, unspecified obesity type  PLAN:  Hypertension We discussed sodium restriction, working on healthy weight loss, and a regular exercise program as the means to achieve improved blood pressure control. Breanna Webster agreed with this plan and agreed to follow up as directed. We will continue to monitor her blood pressure as well as her progress with the above lifestyle modifications. Breanna Webster agrees to restart hydrochlorothiazide 12.5 mg qd #30 with no refills and she will watch for signs of hypotension as she continues her lifestyle modifications. Breanna Webster agrees to follow up with our clinic in 2 weeks.  Diabetes II Breanna Webster has been given extensive diabetes education by myself today including ideal fasting and post-prandial blood glucose readings, individual ideal Hgb A1c goals and hypoglycemia prevention. We discussed the importance of good blood sugar control to decrease the likelihood of diabetic complications such as nephropathy, neuropathy, limb loss, blindness, coronary artery disease, and death. We discussed the  importance of intensive lifestyle modification including diet, exercise and weight loss as the first line treatment for diabetes. Breanna Webster agrees to continue her diabetes medications and she  agrees to follow up with our clinic in 2 weeks.  Cardiovascular risk counselling Breanna Webster was given extended (15 minutes) coronary artery disease prevention counseling today. She is 72 y.o. female and has risk factors for heart disease including obesity, hypertension, and diabetes II. We discussed intensive lifestyle modifications today with an emphasis on specific weight loss instructions and strategies. Pt was also informed of the importance of increasing exercise and decreasing saturated fats to help prevent heart disease.  Obesity Breanna Webster is currently in the action stage of change. As such, her goal is to continue with weight loss efforts She has agreed to follow the Category 3 plan Breanna Webster has been instructed to work up to a goal of 150 minutes of combined cardio and strengthening exercise per week for weight loss and overall health benefits. We discussed the following Behavioral Modification Strategies today: increasing lean protein intake and decreasing sodium intake   Breanna Webster has agreed to follow up with our clinic in 2 weeks. She was informed of the importance of frequent follow up visits to maximize her success with intensive lifestyle modifications for her multiple health conditions.   OBESITY BEHAVIORAL INTERVENTION VISIT  Today's visit was # 14 out of 22.  Starting weight: 198 lbs Starting date: 11/21/16 Today's weight : 195 lbs Today's date: 07/15/2017 Total lbs lost to date: 3 (Patients must lose 7 lbs in the first 6 months to continue with counseling)   ASK: We discussed the diagnosis of obesity with [name] today and Breanna Webster agreed to give [name] permission to discuss obesity behavioral modification therapy today.  ASSESS: Breanna Webster has the diagnosis of obesity and her BMI today is 33.46 Breanna Webster is in the action stage of change   ADVISE: Breanna Webster was educated on the multiple health risks of obesity as well as the benefit of weight loss to improve her health. She was advised of the need for  long term treatment and the importance of lifestyle modifications.  AGREE: Multiple dietary modification options and treatment options were discussed and  Breanna Webster agreed to the above obesity treatment plan.   Breanna Webster Durie, am acting as transcriptionist for Lacy Duverney, PA-C I, Lacy Duverney [facility], have reviewed this note and agree with its content

## 2017-07-19 ENCOUNTER — Ambulatory Visit: Payer: Medicare Other

## 2017-07-19 ENCOUNTER — Telehealth: Payer: Self-pay | Admitting: Adult Health

## 2017-07-19 VITALS — BP 128/70

## 2017-07-19 DIAGNOSIS — I10 Essential (primary) hypertension: Secondary | ICD-10-CM

## 2017-07-19 NOTE — Progress Notes (Signed)
Per orders of Cory Nafziger,Blood pressure check was done by [employer]. Patient tolerated the visit well.

## 2017-07-19 NOTE — Telephone Encounter (Signed)
Copied from [place] [phone]. Topic: Quick Communication - See Telephone Encounter >> Jul 19, 2017 10:01 AM [place], Laban Emperor wrote: CRM for notification. See Telephone encounter for: 07/19/17.  Pt states that she has some questions about her losartan (COZAAR) 50 MG tablet [[id number]] and would like to know if she can come in just to have her BP check.  Please f/u with pt @ [phone]

## 2017-07-19 NOTE — Telephone Encounter (Signed)
Pt had a nurse visit in the office today.  BP was 128/70.  Spoke to the pt.  She said BP had been running 140s at home.  She feels better knowing her bp was in a normal range today.  She has sent for a new bp machine.  Should be arriving in the mail within the next few days.  Advised pt to continue to monitor her bp.  Call back as needed.

## 2017-07-30 ENCOUNTER — Ambulatory Visit (INDEPENDENT_AMBULATORY_CARE_PROVIDER_SITE_OTHER): Payer: Medicare Other | Admitting: Physician Assistant

## 2017-07-30 VITALS — BP 114/61 | HR 60 | Temp 98.0°F | Ht 64.0 in | Wt 189.0 lb

## 2017-07-30 DIAGNOSIS — I10 Essential (primary) hypertension: Secondary | ICD-10-CM

## 2017-07-30 DIAGNOSIS — Z6832 Body mass index (BMI) 32.0-32.9, adult: Secondary | ICD-10-CM | POA: Diagnosis not present

## 2017-07-30 DIAGNOSIS — E669 Obesity, unspecified: Secondary | ICD-10-CM

## 2017-07-30 DIAGNOSIS — E1165 Type 2 diabetes mellitus with hyperglycemia: Secondary | ICD-10-CM | POA: Diagnosis not present

## 2017-07-30 NOTE — Progress Notes (Signed)
Subjective:   Breanna Webster is a 72 y.o. female who presents for Medicare Annual (Subsequent) preventive examination.  Reports health a good  Last injections to back is helping Pain is persistent   Recent visit with Lacy Duverney, PA Enjoying the PA and is committed to her weight loss plan   Diet [facility]  A1c 6.8 in Wellness yesterday  Exercise Walking; makes it worse Pain 5 to 8  Percell Miller and Noemi Chapel  Takes Tylenol and Ibuprofen   Colonoscopy 02/2017 and due 02/2018 with repeat in one year Had 30 polyps - will repeat in November of this year  Had to go in to genetic counseling  No issues were noted   Mammogram in 04/2016  dexa 03/2017  -2.0 Verbalizes understanding and tries to walk 30 minutes if she can. Will consider other forms of exercise that may appeal to her    Health Maintenance Due  Topic Date Due  . OPHTHALMOLOGY EXAM  02/14/2017     Cardiac Risk Factors include: advanced age (>28mn, >>73women);diabetes mellitus;dyslipidemia;family history of premature cardiovascular disease;hypertension;obesity (BMI >30kg/m2)    Objective:     Vitals: Ht 5' 5"  (1.651 m)   Wt 188 lb (85.3 kg)   BMI 31.28 kg/m   Body mass index is 31.28 kg/m.  [facility]  Advanced Directives 07/31/2017 03/20/2017 05/23/2016 04/02/2016  Does Patient Have a Medical Advance Directive? Yes Yes (No Data) No  Type of Advance Directive - Living will - -  Would patient like information on creating a medical advance directive? - - - No - Patient declined    Tobacco no history  Social History   Tobacco Use  Smoking Status Never Smoker  Smokeless Tobacco Never Used     Counseling given: Yes   Clinical Intake:    Past Medical History:  Diagnosis Date  . Arthritis   . Carotid artery stenosis    Mild  . Colon polyps   . Diabetes mellitus   . Diverticulitis   . Diverticulosis   . GERD (gastroesophageal reflux  disease)   . Grave's disease   . Heart murmur    mitral prolapse  . History of colonic polyps 05/22/2017  . Hyperlipidemia   . Hypertension   . IBS (irritable bowel syndrome)   . Osteopenia   . Thalassemia minor    Past Surgical History:  Procedure Laterality Date  . CHOLECYSTECTOMY    . HIATAL HERNIA REPAIR    . HYSTEROSCOPY     fibroids  . laparoscopy     fibroids   Family History  Adopted: Yes  Problem Relation Age of Onset  . Healthy Son        x 2  . Headache Unknown        Cluster headaches  . Colon cancer Neg Hx   . Pancreatic cancer Neg Hx   . Rectal cancer Neg Hx   . Stomach cancer Neg Hx    Social History   Socioeconomic History  . Marital status: Married    Spouse name: Not on file  . Number of children: 2  . Years of education: Not on file  . Highest education level: Not on file  Occupational History  . Occupation: Retired  SScientific laboratory technician . Financial resource strain: Not on file  . Food insecurity:    Worry: Not on file    Inability: Not on file  . Transportation needs:  Medical: Not on file    Non-medical: Not on file  Tobacco Use  . Smoking status: Never Smoker  . Smokeless tobacco: Never Used  Substance and Sexual Activity  . Alcohol use: No    Alcohol/week: 0.0 oz  . Drug use: No  . Sexual activity: Not on file  Lifestyle  . Physical activity:    Days per week: Not on file    Minutes per session: Not on file  . Stress: Not on file  Relationships  . Social connections:    Talks on phone: Not on file    Gets together: Not on file    Attends religious service: Not on file    Active member of club or organization: Not on file    Attends meetings of clubs or organizations: Not on file    Relationship status: Not on file  Other Topics Concern  . Not on file  Social History Narrative   Lives with husband in a one story home.     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   Education: college.    Outpatient Encounter Medications as of 07/31/2017  Medication Sig  . aspirin 81 MG tablet Take 81 mg by mouth daily.    . bisoprolol (ZEBETA) 5 MG tablet Take 1 tablet (5 mg total) by mouth daily.  . cholecalciferol (VITAMIN D) 1000 units tablet Take 1,000 Units by mouth daily.  Marland Kitchen escitalopram (LEXAPRO) 20 MG tablet take 1 tablet by mouth once daily  . glucose blood (ONETOUCH VERIO) test strip Use to check blood sugar 2 times per day. Dx Code: E11.8  . hydrochlorothiazide (HYDRODIURIL) 12.5 MG tablet Take 1 tablet (12.5 mg total) by mouth daily.  . insulin NPH Human (NOVOLIN N RELION) 100 UNIT/ML injection Inject 0.7 mLs (70 Units total) into the skin every morning.  . Insulin Syringe-Needle U-100 (BD INSULIN SYRINGE U/F) 31G X 5/16" 1 ML MISC USE TO INJECT INSULIN ONCE DAILY  . Insulin Syringe-Needle U-100 (INSULIN SYRINGE .5CC/30GX1/2") 30G X 1/2" 0.5 ML MISC Use to inject insulin 2 times per day.  . levothyroxine (SYNTHROID, LEVOTHROID) 175 MCG tablet Take 1 tablet (175 mcg total) daily before breakfast by mouth.  . losartan (COZAAR) 50 MG tablet TAKE 1 TABLET BY MOUTH ONCE DAILY  . Multiple Vitamin (MULITIVITAMIN WITH MINERALS) TABS Take 1 tablet by mouth daily.  . nortriptyline (PAMELOR) 25 MG capsule Take 1 capsule (25 mg total) by mouth at bedtime.  Marland Kitchen NOVOLIN N RELION 100 UNIT/ML injection INJECT 1.3MLS (130 UNITS TOTAL) INTO THE SKIN EVERY MORNING  . ONETOUCH DELICA LANCETS 14E MISC Use to check blood sugar 2 times per day. Dx Code: E11.8  . pantoprazole (PROTONIX) 40 MG tablet Take 1 tablet (40 mg total) by mouth 2 (two) times daily.  . vitamin E 400 UNIT capsule Take 400 Units by mouth daily.   No facility-administered encounter medications on file as of 07/31/2017.     Activities of Daily Living In your present state of health, do you have any difficulty performing the following activities: 07/31/2017  Hearing? N  Vision? N  Difficulty  concentrating or making decisions? N  Walking or climbing stairs? N  Dressing or bathing? N  Doing errands, shopping? N  Preparing Food and eating ? N  Using the Toilet? N  In the past six months, have you accidently leaked urine? N  Do you have problems with loss of bowel  control? N  Managing your Medications? N  Managing your Finances? N  Housekeeping or managing your Housekeeping? N  Some recent data might be hidden    Patient Care Team: [name], NP as PCP - General (Family Medicine)    Assessment:   This is a routine wellness examination for [place].  Exercise Activities and Dietary recommendations Current Exercise Habits: Home exercise routine;Structured exercise class, Type of exercise: walking, Time (Minutes): 30, Frequency (Times/Week): 5, Weekly Exercise (Minutes/Week): 150, Intensity: Moderate  Goals    . Patient Stated     Keep doing the good work with your weight!    . Weight (lb) < 180 lb (81.6 kg)     Go back to weight watchers  Weight watchers has a app themselves  Check out  online nutrition programs as GumSearch.nl and http://vang.com/; fit78m; Look for foods with "whole" wheat; bran; oatmeal etc Shot at the farmer's markets in season for fresher choices  Watch for "hydrogenated" on the label of oils which are trans-fats.  Watch for "high fructose corn syrup" in snacks, yogurt or ketchup  Meats have less marbling; bright colored fruits and vegetables;  Canned; dump out liquid and wash vegetables. Be mindful of what we are eating  Portion control is essential to a health weight! Sit down; take a break and enjoy your meal; take smaller bites; put the fork down between bites;  It takes 20 minutes to get full; so check in with your fullness cues and stop eating when you start to fill full              Fall Risk Fall Risk  07/31/2017 06/14/2017 05/23/2016 04/02/2016 05/27/2015  Falls in the past year? No No No No Yes  Number falls in past yr: - -  - - 1  Injury with Fall? - - - - No  Follow up - - - - Falls evaluation completed     Depression Screen PHQ 2/9 Scores 07/31/2017 06/14/2017 11/21/2016 04/02/2016  PHQ - 2 Score 0 0 4 0  PHQ- 9 Score - - 8 -     Cognitive Function MMSE - Mini Mental State Exam 07/31/2017 07/31/2017 05/23/2016  Not completed: (No Data) (No Data) (No Data)     Ad8 score reviewed for issues:  Issues making decisions:  Less interest in hobbies / activities:  Repeats questions, stories (family complaining):  Trouble using ordinary gadgets (microwave, computer, phone):  Forgets the month or year:   Mismanaging finances:   Remembering appts:  Daily problems with thinking and/or memory: Ad8 score is=0        Immunization History  Administered Date(s) Administered  . Hep A / Hep B 06/15/2015, 07/15/2015, 12/27/2015  . Influenza Split 01/16/2012  . Influenza Whole 01/31/2010  . Influenza, High Dose Seasonal PF 01/28/2014, 12/29/2014, 02/17/2016  . Influenza-Unspecified 12/29/2012  . Pneumococcal Conjugate-13 06/14/2017  . Pneumococcal Polysaccharide-23 01/28/2012, 01/28/2014  . Pneumococcal-Unspecified 04/24/2015  . Tdap 12/29/2014  . Zoster 02/14/2012  . Zoster Recombinat (Shingrix) 03/13/2017, 05/24/2017      Screening Tests Health Maintenance  Topic Date Due  . OPHTHALMOLOGY EXAM  02/14/2017  . HEMOGLOBIN A1C  11/03/2017  . INFLUENZA VACCINE  11/21/2017  . COLONOSCOPY  03/20/2018  . MAMMOGRAM  05/03/2018  . FOOT EXAM  05/06/2018  . TETANUS/TDAP  12/28/2024  . DEXA SCAN  Completed  . Hepatitis C Screening  Completed  . PNA vac Low Risk Adult  Completed         Plan:  PCP Notes   Health Maintenance Will schedule her eye exam soon; [place] clinic To schedule her mammogram  Completed Shingrix series   Abnormal Screens  Bone loss but trying to walk and taking Vit d   Referrals  none  Patient concerns; Desire to lose weight   Nurse Concerns; As noted    Next PCP apt last apt was 06/14/2016        I have personally reviewed and noted the following in the patient's chart:   . Medical and social history . Use of alcohol, tobacco or illicit drugs  . Current medications and supplements . Functional ability and status . Nutritional status . Physical activity . Advanced directives . List of other physicians . Hospitalizations, surgeries, and ER visits in previous 12 months . Vitals . Screenings to include cognitive, depression, and falls . Referrals and appointments  In addition, I have reviewed and discussed with patient certain preventive protocols, quality metrics, and best practice recommendations. A written personalized care plan for preventive services as well as general preventive health recommendations were provided to patient.     Wynetta Fines, RN  07/31/2017

## 2017-07-30 NOTE — Progress Notes (Signed)
Office: [phone]  /  Fax: [phone]   HPI:   Chief Complaint: OBESITY Breanna Webster is here to discuss her progress with her obesity treatment plan. She is on the Category 3 plan and is following her eating plan approximately 75 % of the time. She states she is took a day trip to [place] with a lot of walking. Breanna Webster continues to do well with weight loss. She is mindful of her eating and states she has been keeping up with her protein intake.  Her weight is 189 lb (85.7 kg) today and has had a weight loss of 6 pounds over a period of 2 weeks since her last visit. She has lost 9 lbs since starting treatment with [name].  Diabetes II Breanna Webster has a diagnosis of diabetes type II. Breanna Webster states fasting BGs range between 150's and 160's. She denies any hypoglycemic episodes. She is on insulin and her next follow up with her Endocrinologist is next week. Last A1c was 6.8 on 05/06/17. She has been working on intensive lifestyle modifications including diet, exercise, and weight loss to help control her blood glucose levels.  Hypertension Breanna Webster is a 72 y.o. female with hypertension. Ina's blood pressure is stable. She is back on diuretic hydrochlorothiazide 12.5 mg and has diuresed well. She denies leg edema, chest pain, or shortness of breath. Breanna Webster is also on Zebeta and losartan. She is working weight loss to help control her blood pressure with the goal of decreasing her risk of heart attack and stroke. Breanna Webster's blood pressure is currently controlled.  ALLERGIES: Allergies  Allergen Reactions  . Statins Other (See Comments)    Muscle aches    MEDICATIONS: Current Outpatient Medications on File Prior to Visit  Medication Sig Dispense Refill  . aspirin 81 MG tablet Take 81 mg by mouth daily.      . bisoprolol (ZEBETA) 5 MG tablet Take 1 tablet (5 mg total) by mouth daily. 90 tablet 3  . cholecalciferol (VITAMIN D) 1000 units tablet Take 1,000 Units by mouth daily.    Marland Kitchen escitalopram (LEXAPRO)  20 MG tablet take 1 tablet by mouth once daily 90 tablet 0  . glucose blood (ONETOUCH VERIO) test strip Use to check blood sugar 2 times per day. Dx Code: E11.8 200 each 2  . hydrochlorothiazide (HYDRODIURIL) 12.5 MG tablet Take 1 tablet (12.5 mg total) by mouth daily. 30 tablet 0  . insulin NPH Human (NOVOLIN N RELION) 100 UNIT/ML injection Inject 0.7 mLs (70 Units total) into the skin every morning. 30 mL 2  . Insulin Syringe-Needle U-100 (BD INSULIN SYRINGE U/F) 31G X 5/16" 1 ML MISC USE TO INJECT INSULIN ONCE DAILY 90 each 2  . Insulin Syringe-Needle U-100 (INSULIN SYRINGE .5CC/30GX1/2") 30G X 1/2" 0.5 ML MISC Use to inject insulin 2 times per day. 100 each 2  . levothyroxine (SYNTHROID, LEVOTHROID) 175 MCG tablet Take 1 tablet (175 mcg total) daily before breakfast by mouth. 90 tablet 2  . losartan (COZAAR) 50 MG tablet TAKE 1 TABLET BY MOUTH ONCE DAILY 90 tablet 3  . Multiple Vitamin (MULITIVITAMIN WITH MINERALS) TABS Take 1 tablet by mouth daily.    . nortriptyline (PAMELOR) 25 MG capsule Take 1 capsule (25 mg total) by mouth at bedtime. 90 capsule 3  . NOVOLIN N RELION 100 UNIT/ML injection INJECT 1.3MLS (130 UNITS TOTAL) INTO THE SKIN EVERY MORNING 5 vial 11  . ONETOUCH DELICA LANCETS 84Y MISC Use to check blood sugar 2 times per day. Dx Code:  E11.8 200 each 1  . pantoprazole (PROTONIX) 40 MG tablet Take 1 tablet (40 mg total) by mouth 2 (two) times daily. 60 tablet 3  . vitamin E 400 UNIT capsule Take 400 Units by mouth daily.     No current facility-administered medications on file prior to visit.     PAST MEDICAL HISTORY: Past Medical History:  Diagnosis Date  . Arthritis   . Carotid artery stenosis    Mild  . Colon polyps   . Diabetes mellitus   . Diverticulitis   . Diverticulosis   . GERD (gastroesophageal reflux disease)   . Grave's disease   . Heart murmur    mitral prolapse  . History of colonic polyps 05/22/2017  . Hyperlipidemia   . Hypertension   . IBS (irritable  bowel syndrome)   . Osteopenia   . Thalassemia minor     PAST SURGICAL HISTORY: Past Surgical History:  Procedure Laterality Date  . CHOLECYSTECTOMY    . HIATAL HERNIA REPAIR    . HYSTEROSCOPY     fibroids  . laparoscopy     fibroids    SOCIAL HISTORY: Social History   Tobacco Use  . Smoking status: Never Smoker  . Smokeless tobacco: Never Used  Substance Use Topics  . Alcohol use: No    Alcohol/week: 0.0 oz  . Drug use: No    FAMILY HISTORY: Family History  Adopted: Yes  Problem Relation Age of Onset  . Healthy Son        x 2  . Headache Unknown        Cluster headaches  . Colon cancer Neg Hx   . Pancreatic cancer Neg Hx   . Rectal cancer Neg Hx   . Stomach cancer Neg Hx     ROS: Review of Systems  Constitutional: Positive for weight loss.  Respiratory: Negative for shortness of breath.   Cardiovascular: Negative for chest pain.  Endo/Heme/Allergies:       Negative hypoglycemia    PHYSICAL EXAM: Blood pressure 114/61, pulse 60, temperature 98 F (36.7 C), temperature source Oral, height 5' 4"  (1.626 m), weight 189 lb (85.7 kg), SpO2 96 %. Body mass index is 32.44 kg/m. Physical Exam  Constitutional: She is oriented to person, place, and time. She appears well-developed and well-nourished.  Cardiovascular: Normal rate.  Pulmonary/Chest: Effort normal.  Musculoskeletal: Normal range of motion.  Neurological: She is oriented to person, place, and time.  Skin: Skin is warm and dry.  Psychiatric: She has a normal mood and affect. Her behavior is normal.  Vitals reviewed.   RECENT LABS AND TESTS: BMET    Component Value Date/Time   NA 137 06/14/2017 0826   NA 141 02/26/2017 1116   K 4.1 06/14/2017 0826   CL 100 06/14/2017 0826   CO2 28 06/14/2017 0826   GLUCOSE 175 (H) 06/14/2017 0826   GLUCOSE 135 09/08/2008   BUN 26 (H) 06/14/2017 0826   BUN 22 02/26/2017 1116   CREATININE 0.80 06/14/2017 0826   CALCIUM 10.2 06/14/2017 0826   GFRNONAA 69  02/26/2017 1116   GFRAA 80 02/26/2017 1116   Lab Results  Component Value Date   HGBA1C 6.8 05/06/2017   HGBA1C 6.1 02/08/2017   HGBA1C 7.8 (H) 11/21/2016   HGBA1C 7.0 09/27/2016   HGBA1C 7.5 07/05/2016   Lab Results  Component Value Date   INSULIN 76.3 (H) 11/21/2016   CBC    Component Value Date/Time   WBC 8.2 06/14/2017 0826   RBC  6.04 (H) 06/14/2017 0826   HGB 11.9 (L) 06/14/2017 0826   HGB 10.8 (L) 02/26/2017 1054   HCT 37.3 06/14/2017 0826   HCT 33.3 (L) 02/26/2017 1054   PLT 261.0 06/14/2017 0826   PLT 270 11/21/2016 1113   MCV 61.8 Repeated and verified X2. (L) 06/14/2017 0826   MCV 61 (L) 02/26/2017 1054   MCH 19.9 (L) 02/26/2017 1054   MCH 19.8 (L) 02/17/2014 1705   MCHC 31.9 06/14/2017 0826   RDW 14.7 06/14/2017 0826   RDW 15.9 (H) 02/26/2017 1054   LYMPHSABS 1.9 06/14/2017 0826   LYMPHSABS 1.2 02/26/2017 1054   MONOABS 0.6 06/14/2017 0826   EOSABS 0.3 06/14/2017 0826   EOSABS 0.1 02/26/2017 1054   BASOSABS 0.1 06/14/2017 0826   BASOSABS 0.0 02/26/2017 1054   Iron/TIBC/Ferritin/ %Sat    Component Value Date/Time   IRON 137 05/29/2016 1116   FERRITIN 106.1 05/29/2016 1116   IRONPCTSAT 38.4 05/29/2016 1116   Lipid Panel     Component Value Date/Time   CHOL 159 06/14/2017 0826   CHOL 156 11/21/2016 1113   TRIG 123.0 06/14/2017 0826   HDL 41.20 06/14/2017 0826   HDL 44 11/21/2016 1113   CHOLHDL 4 06/14/2017 0826   VLDL 24.6 06/14/2017 0826   LDLCALC 93 06/14/2017 0826   LDLCALC 91 11/21/2016 1113   LDLDIRECT 98.0 05/24/2014 1343   Hepatic Function Panel     Component Value Date/Time   PROT 7.0 06/14/2017 0826   PROT 6.9 02/26/2017 1116   ALBUMIN 3.9 06/14/2017 0826   ALBUMIN 3.9 02/26/2017 1116   AST 26 06/14/2017 0826   ALT 32 06/14/2017 0826   ALKPHOS 73 06/14/2017 0826   BILITOT 0.7 06/14/2017 0826   BILITOT 0.7 02/26/2017 1116   BILIDIR 0.2 04/13/2016 0804   IBILI 0.5 02/17/2014 1705      Component Value Date/Time   TSH 0.90  05/06/2017 1350   TSH 5.170 (H) 02/26/2017 1116   TSH 2.560 11/21/2016 1113    ASSESSMENT AND PLAN: Type 2 diabetes mellitus with hyperglycemia, without long-term current use of insulin (HCC)  Essential hypertension  Class 1 obesity with serious comorbidity and body mass index (BMI) of 32.0 to 32.9 in adult, unspecified obesity type  PLAN:  Diabetes II Breanna Webster has been given extensive diabetes education by myself today including ideal fasting and post-prandial blood glucose readings, individual ideal Hgb A1c goals and hypoglycemia prevention. We discussed the importance of good blood sugar control to decrease the likelihood of diabetic complications such as nephropathy, neuropathy, limb loss, blindness, coronary artery disease, and death. We discussed the importance of intensive lifestyle modification including diet, exercise and weight loss as the first line treatment for diabetes. Breanna Webster agrees to continue her diabetes medications and she agrees to follow up with our clinic in 3 weeks.  Hypertension We discussed sodium restriction, working on healthy weight loss, and a regular exercise program as the means to achieve improved blood pressure control. Breanna Webster agreed with this plan and agreed to follow up as directed. We will continue to monitor her blood pressure as well as her progress with the above lifestyle modifications. Breanna Webster agrees to continue her medications as prescribed and will watch for signs of hypotension as she continues her lifestyle modifications. Breanna Webster agrees to follow up with our clinic in 3 weeks.  We spent > than 50% of the 15 minute visit on the counseling as documented in the note.  Obesity Kizzi is currently in the action stage of change.  As such, her goal is to continue with weight loss efforts She has agreed to follow the Category 3 plan [place]nore has been instructed to work up to a goal of 150 minutes of combined cardio and strengthening exercise per week for weight loss and  overall health benefits. We discussed the following Behavioral Modification Strategies today: increasing lean protein intake and work on meal planning and easy cooking plans   Sharmel has agreed to follow up with our clinic in 3 weeks. She was informed of the importance of frequent follow up visits to maximize her success with intensive lifestyle modifications for her multiple health conditions.   OBESITY BEHAVIORAL INTERVENTION VISIT  Today's visit was # 15 out of 22.  Starting weight: 198 lbs Starting date: 11/21/16 Today's weight : 189 lbs Today's date: 07/30/2017 Total lbs lost to date: 9 (Patients must lose 7 lbs in the first 6 months to continue with counseling)   ASK: We discussed the diagnosis of obesity with [name] today and Braeden agreed to give [name] permission to discuss obesity behavioral modification therapy today.  ASSESS: Meha has the diagnosis of obesity and her BMI today is 32.43 Jaianna is in the action stage of change   ADVISE: Maily was educated on the multiple health risks of obesity as well as the benefit of weight loss to improve her health. She was advised of the need for long term treatment and the importance of lifestyle modifications.  AGREE: Multiple dietary modification options and treatment options were discussed and  Marya agreed to the above obesity treatment plan.   Wilhemena Durie, am acting as transcriptionist for Breanna Duverney, PA-C I, Breanna Webster [facility], have reviewed this note and agree with its content

## 2017-07-31 ENCOUNTER — Ambulatory Visit (INDEPENDENT_AMBULATORY_CARE_PROVIDER_SITE_OTHER): Payer: Medicare Other

## 2017-07-31 VITALS — Ht 65.0 in | Wt 188.0 lb

## 2017-07-31 DIAGNOSIS — Z0000 Encounter for general adult medical examination without abnormal findings: Secondary | ICD-10-CM

## 2017-07-31 NOTE — Patient Instructions (Addendum)
Breanna Webster , Thank you for taking time to come for your Medicare Wellness Visit. I appreciate your ongoing commitment to your health goals. Please review the following plan we discussed and let me know if I can assist you in the future.   Will schedule an eye apt with Dr. [place]   Will schedule breast exam   Has some bone loss; Keep taking your Vit d Walk as much as you can   These are the goals we discussed: Goals    . Weight (lb) < 180 lb (81.6 kg)     Go back to weight watchers  Weight watchers has a app themselves  Check out  online nutrition programs as GumSearch.nl and http://vang.com/; fit55m; Look for foods with "whole" wheat; bran; oatmeal etc Shot at the farmer's markets in season for fresher choices  Watch for "hydrogenated" on the label of oils which are trans-fats.  Watch for "high fructose corn syrup" in snacks, yogurt or ketchup  Meats have less marbling; bright colored fruits and vegetables;  Canned; dump out liquid and wash vegetables. Be mindful of what we are eating  Portion control is essential to a health weight! Sit down; take a break and enjoy your meal; take smaller bites; put the fork down between bites;  It takes 20 minutes to get full; so check in with your fullness cues and stop eating when you start to fill full              This is a list of the screening recommended for you and due dates:  Health Maintenance  Topic Date Due  . Eye exam for diabetics  02/14/2017  . Hemoglobin A1C  11/03/2017  . Flu Shot  11/21/2017  . Colon Cancer Screening  03/20/2018  . Mammogram  05/03/2018  . Complete foot exam   05/06/2018  . Tetanus Vaccine  12/28/2024  . DEXA scan (bone density measurement)  Completed  .  Hepatitis C: One time screening is recommended by Center for Disease Control  (CDC) for  adults born from [birth year]through 1965.   Completed  . Pneumonia vaccines  Completed    CManufacturing engineerof  Services Cost  A Matter of Balance Class locations vary. Call PMorongo Valleyon Aging for more information.  [email or web address]-Session program addressing the fear of falling and increasing activity levels of older adults Free to minimal cost  A.C.T. By DThe Pepsi[address] [place] [id number]  [phone] Personal training, gym, classes including Silver Sneakers* and ACTion for Aging Adults Fee-based  A.H.O.Y. (Add Health to OKalida Airs on Time WHewlett-[employer]ard13, M-F at 8[place] [employer]  3[place]wood[place] 2[place]  5[place] 6[place]opeLAdvanced Surgical Care Of Bo[facility] [address] FSt[address] Parish HospitalDr LPont[facility] 2[place] [address] [address] [place] Location: RSharrell Ku CColgate-Palmolive6[place]     [phone]   [phone] total-body conditioning class for adults 531and older; designed to increase muscular strength, endurance, range of movement, flexibility, balance, agility and coordination Free  [place]  [facility] [place], [place] [address] [place]      [address]      [phone]      Pilate's class for individualsreturning to exercise after an injury, before or after surgery or for individuals with complex musculoskeletal issues; designed to improve strength, balance , flexibility      $15/class  [place] [address] [place], [place] [address] [email or web address] [place] classes for beginners to advanced [place], [place] [address] [email or web address] [place] Chair Exercises Free, ages 64 and older; Ages 47-59 fee based  Marvia Pickles, [facility] [address] [place], [place] [address] Seniorcenter_1 .Beverlee Nims [phone]  A.H.O.Y. Tai Chi Fee-based Donation based or free  [place] Class locations vary.  Call or email Angela Burke or view website for more information. Info_[email or web address] .com GainPain.com.cy.html [phone] Ongoing classes at local YMCAs and gyms Fee-based  Silver Sneakers A.C.T. By [place] [employer]: [place] [phone]  [phone] Classes designed for older adults who want to improve their strength, flexibility, balance and endurance.   Silver sneakers is covered by some insurance plans and includes a fitness center membership at participating locations. Find out more by calling [phone] or visiting [email or web address] Covered by some insurance plans  [facility] [place] [phone] A.H.O.Y., fitness room, personal training, fitness classes for injury prevention, strength, balance, flexibility, water fitness classes Ages 55+: $16 for 6 months; Ages 46-54: $91 for 6 months  Tai Chi for Everybody [facility] [address] [place], [place]  [address] Taichiforeverybody_3 .Patsi Sears [phone] Tai Chi classes for beginners to advanced; geared for seniors Donation Based      [employer] (Helpling Others Participate in Exercise     Loyal Gambler. Rosana Hoes, PhD, [place] pgdavis_[email or web address] .edu [place]     [phone]     A comprehensive fitness program for adults.  The program paris senior-level undergraduates Kinesiology students with adults who desire to learn how to exercise safely.  Includes a structural exercise class focusing on functional fitnesss     $100/semester in fall and spring; $75 in summer (no trainers)    *Silver Sneakers is covered by some Personal assistant and includes a  Radio producer at participating locations.  Find out more by calling [phone] or visiting [email or web address]  For additional health and human services resources for senior adults, please contact SeniorLine at [phone] in [place] and [place] at [phone] in all other areas.  Fall Prevention in the Home Falls can cause injuries. They can happen to people of all ages. There are many things you can do to make your home safe and to help prevent falls. What can I do on the outside of my home?  Regularly fix the edges of walkways and driveways and fix any cracks.  Remove anything that might make you trip as you walk through a door, such as a raised step or threshold.  Trim any bushes or trees on the path to your home.  Use bright outdoor lighting.  Clear any walking paths of anything that might make someone trip, such as rocks or tools.  Regularly check to see if handrails are loose or broken. Make sure that both sides of any steps have handrails.  Any raised decks and porches should have guardrails on the edges.  Have any leaves, snow, or ice cleared regularly.  Use sand or salt on walking paths during winter.  Clean up any spills in your garage right away. This includes oil or grease spills. What can I do in the  bathroom?  Use night lights.  Install grab bars by the toilet and in the tub and shower. Do not use towel bars as grab bars.  Use non-skid mats or decals in the tub or shower.  If you need to sit down in the shower, use a plastic, non-slip stool.  Keep the floor dry. Clean up any water that spills on the floor as soon as it happens.  Remove soap buildup in the tub or shower regularly.  Attach bath mats securely with double-sided non-slip rug tape.  Do not have throw rugs and other things on the floor that can make you trip. What can I do in the bedroom?  Use night lights.  Make sure that you have a light by your bed that is easy to reach.  Do not use any sheets or blankets that are too big for your bed. They should not hang down onto the floor.  Have a firm chair that has side arms. You can use this for support while you get dressed.  Do not have throw rugs and other things on the floor that can make you trip. What can I do in the kitchen?  Clean up any spills right away.  Avoid walking on wet floors.  Keep items that you use a lot in easy-to-reach places.  If you need to reach something above you, use a strong step stool that has a grab bar.  Keep electrical cords out of the way.  Do not use floor polish or wax that makes floors slippery. If you must use wax, use non-skid floor wax.  Do not have throw rugs and other things on the floor that can make you trip. What can I do with my stairs?  Do not leave any items on the stairs.  Make sure that there are handrails on both sides of the stairs and use them. Fix handrails that are broken or loose. Make sure that handrails are as long as the stairways.  Check any carpeting to make sure that it is firmly attached to the stairs. Fix any carpet that is loose or worn.  Avoid having throw rugs at the top or bottom of the stairs. If you do have throw rugs, attach them to the floor with carpet tape.  Make sure that you have a  light switch at the top of the stairs and the bottom of the stairs. If you do not have them, ask someone to add them for you. What else can I do to help prevent falls?  Wear shoes that: ? Do not have high heels. ? Have rubber bottoms. ? Are comfortable and fit you well. ? Are closed at the toe. Do not wear sandals.  If you use a stepladder: ? Make sure that it is fully opened. Do not climb a closed stepladder. ? Make sure that both sides of the stepladder are locked into place. ? Ask someone to hold it for  you, if possible.  Clearly mark and make sure that you can see: ? Any grab bars or handrails. ? First and last steps. ? Where the edge of each step is.  Use tools that help you move around (mobility aids) if they are needed. These include: ? Canes. ? Walkers. ? Scooters. ? Crutches.  Turn on the lights when you go into a dark area. Replace any light bulbs as soon as they burn out.  Set up your furniture so you have a clear path. Avoid moving your furniture around.  If any of your floors are uneven, fix them.  If there are any pets around you, be aware of where they are.  Review your medicines with your doctor. Some medicines can make you feel dizzy. This can increase your chance of falling. Ask your doctor what other things that you can do to help prevent falls. This information is not intended to replace advice given to you by your health care provider. Make sure you discuss any questions you have with your health care provider. Document Released: 02/03/2009 Document Revised: 09/15/2015 Document Reviewed: 05/14/2014 Elsevier Interactive Patient Education  2018 [place] Maintenance, Female Adopting a healthy lifestyle and getting preventive care can go a long way to promote health and wellness. Talk with your health care provider about what schedule of regular examinations is right for you. This is a good chance for you to check in with your provider about  disease prevention and staying healthy. In between checkups, there are plenty of things you can do on your own. Experts have done a lot of research about which lifestyle changes and preventive measures are most likely to keep you healthy. Ask your health care provider for more information. Weight and diet Eat a healthy diet  Be sure to include plenty of vegetables, fruits, low-fat dairy products, and lean protein.  Do not eat a lot of foods high in solid fats, added sugars, or salt.  Get regular exercise. This is one of the most important things you can do for your health. ? Most adults should exercise for at least 150 minutes each week. The exercise should increase your heart rate and make you sweat (moderate-intensity exercise). ? Most adults should also do strengthening exercises at least twice a week. This is in addition to the moderate-intensity exercise.  Maintain a healthy weight  Body mass index (BMI) is a measurement that can be used to identify possible weight problems. It estimates body fat based on height and weight. Your health care provider can help determine your BMI and help you achieve or maintain a healthy weight.  For females 90 years of age and older: ? A BMI below 18.5 is considered underweight. ? A BMI of 18.5 to 24.9 is normal. ? A BMI of 25 to 29.9 is considered overweight. ? A BMI of 30 and above is considered obese.  Watch levels of cholesterol and blood lipids  You should start having your blood tested for lipids and cholesterol at 74 years of age, then have this test every 5 years.  You may need to have your cholesterol levels checked more often if: ? Your lipid or cholesterol levels are high. ? You are older than 74 years of age. ? You are at high risk for heart disease.  Cancer screening Lung Cancer  Lung cancer screening is recommended for adults 54-64 years old who are at high risk for lung cancer because of a history of smoking.  A  yearly low-dose  CT scan of the lungs is recommended for people who: ? Currently smoke. ? Have quit within the past 15 years. ? Have at least a 30-pack-year history of smoking. A pack year is smoking an average of one pack of cigarettes a day for 1 year.  Yearly screening should continue until it has been 15 years since you quit.  Yearly screening should stop if you develop a health problem that would prevent you from having lung cancer treatment.  Breast Cancer  Practice breast self-awareness. This means understanding how your breasts normally appear and feel.  It also means doing regular breast self-exams. Let your health care provider know about any changes, no matter how small.  If you are in your 20s or 30s, you should have a clinical breast exam (CBE) by a health care provider every 1-3 years as part of a regular health exam.  If you are 46 or older, have a CBE every year. Also consider having a breast X-ray (mammogram) every year.  If you have a family history of breast cancer, talk to your health care provider about genetic screening.  If you are at high risk for breast cancer, talk to your health care provider about having an MRI and a mammogram every year.  Breast cancer gene (BRCA) assessment is recommended for women who have family members with BRCA-related cancers. BRCA-related cancers include: ? Breast. ? Ovarian. ? Tubal. ? Peritoneal cancers.  Results of the assessment will determine the need for genetic counseling and BRCA1 and BRCA2 testing.  Cervical Cancer Your health care provider may recommend that you be screened regularly for cancer of the pelvic organs (ovaries, uterus, and vagina). This screening involves a pelvic examination, including checking for microscopic changes to the surface of your cervix (Pap test). You may be encouraged to have this screening done every 3 years, beginning at age 66.  For women ages 51-65, health care providers may recommend pelvic exams and Pap  testing every 3 years, or they may recommend the Pap and pelvic exam, combined with testing for human papilloma virus (HPV), every 5 years. Some types of HPV increase your risk of cervical cancer. Testing for HPV may also be done on women of any age with unclear Pap test results.  Other health care providers may not recommend any screening for nonpregnant women who are considered low risk for pelvic cancer and who do not have symptoms. Ask your health care provider if a screening pelvic exam is right for you.  If you have had past treatment for cervical cancer or a condition that could lead to cancer, you need Pap tests and screening for cancer for at least 20 years after your treatment. If Pap tests have been discontinued, your risk factors (such as having a new sexual partner) need to be reassessed to determine if screening should resume. Some women have medical problems that increase the chance of getting cervical cancer. In these cases, your health care provider may recommend more frequent screening and Pap tests.  Colorectal Cancer  This type of cancer can be detected and often prevented.  Routine colorectal cancer screening usually begins at 74 years of age and continues through 74 years of age.  Your health care provider may recommend screening at an earlier age if you have risk factors for colon cancer.  Your health care provider may also recommend using home test kits to check for hidden blood in the stool.  A small camera at the end of  a tube can be used to examine your colon directly (sigmoidoscopy or colonoscopy). This is done to check for the earliest forms of colorectal cancer.  Routine screening usually begins at age 27.  Direct examination of the colon should be repeated every 5-10 years through 74 years of age. However, you may need to be screened more often if early forms of precancerous polyps or small growths are found.  Skin Cancer  Check your skin from head to toe  regularly.  Tell your health care provider about any new moles or changes in moles, especially if there is a change in a mole's shape or color.  Also tell your health care provider if you have a mole that is larger than the size of a pencil eraser.  Always use sunscreen. Apply sunscreen liberally and repeatedly throughout the day.  Protect yourself by wearing long sleeves, pants, a wide-brimmed hat, and sunglasses whenever you are outside.  Heart disease, diabetes, and high blood pressure  High blood pressure causes heart disease and increases the risk of stroke. High blood pressure is more likely to develop in: ? People who have blood pressure in the high end of the normal range (130-139/85-89 mm Hg). ? People who are overweight or obese. ? People who are African American.  If you are 56-23 years of age, have your blood pressure checked every 3-5 years. If you are 76 years of age or older, have your blood pressure checked every year. You should have your blood pressure measured twice-once when you are at a hospital or clinic, and once when you are not at a hospital or clinic. Record the average of the two measurements. To check your blood pressure when you are not at a hospital or clinic, you can use: ? An automated blood pressure machine at a pharmacy. ? A home blood pressure monitor.  If you are between 48 years and 71 years old, ask your health care provider if you should take aspirin to prevent strokes.  Have regular diabetes screenings. This involves taking a blood sample to check your fasting blood sugar level. ? If you are at a normal weight and have a low risk for diabetes, have this test once every three years after 74 years of age. ? If you are overweight and have a high risk for diabetes, consider being tested at a younger age or more often. Preventing infection Hepatitis B  If you have a higher risk for hepatitis B, you should be screened for this virus. You are considered  at high risk for hepatitis B if: ? You were born in a country where hepatitis B is common. Ask your health care provider which countries are considered high risk. ? Your parents were born in a high-risk country, and you have not been immunized against hepatitis B (hepatitis B vaccine). ? You have HIV or AIDS. ? You use needles to inject street drugs. ? You live with someone who has hepatitis B. ? You have had sex with someone who has hepatitis B. ? You get hemodialysis treatment. ? You take certain medicines for conditions, including cancer, organ transplantation, and autoimmune conditions.  Hepatitis C  Blood testing is recommended for: ? Everyone born from [birth year] through 1967. ? Anyone with known risk factors for hepatitis C.  Sexually transmitted infections (STIs)  You should be screened for sexually transmitted infections (STIs) including gonorrhea and chlamydia if: ? You are sexually active and are younger than 74 years of age. ? You are older  than 74 years of age and your health care provider tells you that you are at risk for this type of infection. ? Your sexual activity has changed since you were last screened and you are at an increased risk for chlamydia or gonorrhea. Ask your health care provider if you are at risk.  If you do not have HIV, but are at risk, it may be recommended that you take a prescription medicine daily to prevent HIV infection. This is called pre-exposure prophylaxis (PrEP). You are considered at risk if: ? You are sexually active and do not regularly use condoms or know the HIV status of your partner(s). ? You take drugs by injection. ? You are sexually active with a partner who has HIV.  Talk with your health care provider about whether you are at high risk of being infected with HIV. If you choose to begin PrEP, you should first be tested for HIV. You should then be tested every 3 months for as long as you are taking PrEP. Pregnancy  If you are  premenopausal and you may become pregnant, ask your health care provider about preconception counseling.  If you may become pregnant, take 400 to 800 micrograms (mcg) of folic acid every day.  If you want to prevent pregnancy, talk to your health care provider about birth control (contraception). Osteoporosis and menopause  Osteoporosis is a disease in which the bones lose minerals and strength with aging. This can result in serious bone fractures. Your risk for osteoporosis can be identified using a bone density scan.  If you are 45 years of age or older, or if you are at risk for osteoporosis and fractures, ask your health care provider if you should be screened.  Ask your health care provider whether you should take a calcium or vitamin D supplement to lower your risk for osteoporosis.  Menopause may have certain physical symptoms and risks.  Hormone replacement therapy may reduce some of these symptoms and risks. Talk to your health care provider about whether hormone replacement therapy is right for you. Follow these instructions at home:  Schedule regular health, dental, and eye exams.  Stay current with your immunizations.  Do not use any tobacco products including cigarettes, chewing tobacco, or electronic cigarettes.  If you are pregnant, do not drink alcohol.  If you are breastfeeding, limit how much and how often you drink alcohol.  Limit alcohol intake to no more than 1 drink per day for nonpregnant women. One drink equals 12 ounces of beer, 5 ounces of wine, or 1 ounces of hard liquor.  Do not use street drugs.  Do not share needles.  Ask your health care provider for help if you need support or information about quitting drugs.  Tell your health care provider if you often feel depressed.  Tell your health care provider if you have ever been abused or do not feel safe at home. This information is not intended to replace advice given to you by your health care  provider. Make sure you discuss any questions you have with your health care provider. Document Released: 10/23/2010 Document Revised: 09/15/2015 Document Reviewed: 01/11/2015 Elsevier Interactive Patient Education  [employer].

## 2017-07-31 NOTE — Progress Notes (Signed)
I have reviewed and agree with this plan  

## 2017-08-05 ENCOUNTER — Ambulatory Visit (INDEPENDENT_AMBULATORY_CARE_PROVIDER_SITE_OTHER): Payer: Medicare Other | Admitting: Endocrinology

## 2017-08-05 ENCOUNTER — Encounter: Payer: Self-pay | Admitting: Endocrinology

## 2017-08-05 VITALS — BP 136/78 | HR 64 | Temp 99.1°F | Ht 65.0 in | Wt 195.0 lb

## 2017-08-05 DIAGNOSIS — E118 Type 2 diabetes mellitus with unspecified complications: Secondary | ICD-10-CM

## 2017-08-05 DIAGNOSIS — E038 Other specified hypothyroidism: Secondary | ICD-10-CM

## 2017-08-05 LAB — POCT GLYCOSYLATED HEMOGLOBIN (HGB A1C): Hemoglobin A1C: 7.4

## 2017-08-05 LAB — TSH: TSH: 0.79 u[IU]/mL (ref 0.35–4.50)

## 2017-08-05 MED ORDER — INSULIN NPH (HUMAN) (ISOPHANE) 100 UNIT/ML ~~LOC~~ SUSP: 80.0000 [IU] | SUBCUTANEOUS | 2 refills | Status: DC

## 2017-08-05 NOTE — Patient Instructions (Addendum)
Please continue the same insulin.  blood tests are requested for you today.  We'll let you know about the results.  check your blood sugar twice a day.  vary the time of day when you check, between before the 3 meals, and at bedtime.  also check if you have symptoms of your blood sugar being too high or too low.  please keep a record of the readings and bring it to your next appointment here (or you can bring the meter itself).  You can write it on any piece of paper.  please call us sooner if your blood sugar goes below 70, or if you have a lot of readings over 200.   Please come back for a follow-up appointment in 3-4 months.

## 2017-08-05 NOTE — Progress Notes (Signed)
Subjective:    Patient ID: Breanna Webster, female    DOB: [birth year], 72 y.o.   MRN: [id number]  HPI Pt returns for f/u of diabetes mellitus: DM type: Insulin-requiring type 2 Dx'ed: 2003.  Complications: none.   Therapy: insulin since 2017.     GDM: never.   DKA: never.  Severe hypoglycemia: never.  Pancreatitis: never.   Other: she stopped metformin-XR due to nausea and diarrhea; pioglitizone is precluded by edema.  she takes human insulin, due to cost; she declines multiple daily injections.  Interval history: no cbg record, but states cbg's are well-controlled.  She takes insulin as rx'ed.  pt states she feels well in general.  No recent steroids.  She takes 80 units qam.   Past Medical History:  Diagnosis Date  . Arthritis   . Carotid artery stenosis    Mild  . Colon polyps   . Diabetes mellitus   . Diverticulitis   . Diverticulosis   . GERD (gastroesophageal reflux disease)   . Grave's disease   . Heart murmur    mitral prolapse  . History of colonic polyps 05/22/2017  . Hyperlipidemia   . Hypertension   . IBS (irritable bowel syndrome)   . Osteopenia   . Thalassemia minor     Past Surgical History:  Procedure Laterality Date  . CHOLECYSTECTOMY    . HIATAL HERNIA REPAIR    . HYSTEROSCOPY     fibroids  . laparoscopy     fibroids    Social History   Socioeconomic History  . Marital status: Married    Spouse name: Not on file  . Number of children: 2  . Years of education: Not on file  . Highest education level: Not on file  Occupational History  . Occupation: Retired  Scientific laboratory technician  . Financial resource strain: Not on file  . Food insecurity:    Worry: Not on file    Inability: Not on file  . Transportation needs:    Medical: Not on file    Non-medical: Not on file  Tobacco Use  . Smoking status: Never Smoker  . Smokeless tobacco: Never Used  Substance and Sexual Activity  . Alcohol use: No    Alcohol/week: 0.0 oz  . Drug use: No  . Sexual  activity: Not on file  Lifestyle  . Physical activity:    Days per week: Not on file    Minutes per session: Not on file  . Stress: Not on file  Relationships  . Social connections:    Talks on phone: Not on file    Gets together: Not on file    Attends religious service: Not on file    Active member of club or organization: Not on file    Attends meetings of clubs or organizations: Not on file    Relationship status: Not on file  . Intimate partner violence:    Fear of current or ex partner: Not on file    Emotionally abused: Not on file    Physically abused: Not on file    Forced sexual activity: Not on file  Other Topics Concern  . Not on file  Social History Narrative   Lives with husband in a one story home.     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   Education: college.    Current Outpatient Medications on File Prior to Visit  Medication Sig Dispense Refill  .  aspirin 81 MG tablet Take 81 mg by mouth daily.      . bisoprolol (ZEBETA) 5 MG tablet Take 1 tablet (5 mg total) by mouth daily. 90 tablet 3  . cholecalciferol (VITAMIN D) 1000 units tablet Take 1,000 Units by mouth daily.    Marland Kitchen escitalopram (LEXAPRO) 20 MG tablet take 1 tablet by mouth once daily 90 tablet 0  . glucose blood (ONETOUCH VERIO) test strip Use to check blood sugar 2 times per day. Dx Code: E11.8 200 each 2  . hydrochlorothiazide (HYDRODIURIL) 12.5 MG tablet Take 1 tablet (12.5 mg total) by mouth daily. 30 tablet 0  . Insulin Syringe-Needle U-100 (BD INSULIN SYRINGE U/F) 31G X 5/16" 1 ML MISC USE TO INJECT INSULIN ONCE DAILY 90 each 2  . levothyroxine (SYNTHROID, LEVOTHROID) 175 MCG tablet Take 1 tablet (175 mcg total) daily before breakfast by mouth. 90 tablet 2  . losartan (COZAAR) 50 MG tablet TAKE 1 TABLET BY MOUTH ONCE DAILY 90 tablet 3  . Multiple Vitamin (MULITIVITAMIN WITH MINERALS) TABS Take 1 tablet by mouth daily.    .  nortriptyline (PAMELOR) 25 MG capsule Take 1 capsule (25 mg total) by mouth at bedtime. 90 capsule 3  . ONETOUCH DELICA LANCETS 30Z MISC Use to check blood sugar 2 times per day. Dx Code: E11.8 200 each 1  . pantoprazole (PROTONIX) 40 MG tablet Take 1 tablet (40 mg total) by mouth 2 (two) times daily. 60 tablet 3  . vitamin E 400 UNIT capsule Take 400 Units by mouth daily.     No current facility-administered medications on file prior to visit.     Allergies  Allergen Reactions  . Statins Other (See Comments)    Muscle aches    Family History  Adopted: Yes  Problem Relation Age of Onset  . Healthy Son        x 2  . Headache Unknown        Cluster headaches  . Colon cancer Neg Hx   . Pancreatic cancer Neg Hx   . Rectal cancer Neg Hx   . Stomach cancer Neg Hx     BP 136/78 (BP Location: Left Arm, Patient Position: Sitting, Cuff Size: Normal)   Pulse 64   Temp 99.1 F (37.3 C) (Oral)   Ht 5' 5"  (1.651 m)   Wt 195 lb (88.5 kg)   SpO2 94%   BMI 32.45 kg/m   Review of Systems She denies hypoglycemia.      Objective:   Physical Exam VITAL SIGNS:  See vs page GENERAL: no distress Pulses: dorsalis pedis intact bilat.   MSK: no deformity of the feet CV: no leg edema Skin:  no ulcer on the feet.  normal color and temp on the feet. Neuro: sensation is intact to touch on the feet.   Lab Results  Component Value Date   HGBA1C 7.4 08/05/2017       Assessment & Plan:  Insulin-requiring type 2 DM: this is the best control this pt should aim for, given this regimen, which does match insulin to her changing needs throughout the day Hypothyroidism: due for recheck  Patient Instructions  Please continue the same insulin.  blood tests are requested for you today.  We'll let you know about the results.  check your blood sugar twice a day.  vary the time of day when you check, between before the 3 meals, and at bedtime.  also check if you have symptoms of your blood sugar being  too high or too low.  please keep a record of the readings and bring it to your next appointment here (or you can bring the meter itself).  You can write it on any piece of paper.  please call us sooner if your blood sugar goes below 70, or if you have a lot of readings over 200.   Please come back for a follow-up appointment in 3-4 months.

## 2017-08-06 ENCOUNTER — Other Ambulatory Visit (INDEPENDENT_AMBULATORY_CARE_PROVIDER_SITE_OTHER): Payer: Self-pay | Admitting: Physician Assistant

## 2017-08-06 DIAGNOSIS — I10 Essential (primary) hypertension: Secondary | ICD-10-CM

## 2017-08-08 ENCOUNTER — Other Ambulatory Visit (INDEPENDENT_AMBULATORY_CARE_PROVIDER_SITE_OTHER): Payer: Self-pay

## 2017-08-08 DIAGNOSIS — I10 Essential (primary) hypertension: Secondary | ICD-10-CM

## 2017-08-08 MED ORDER — HYDROCHLOROTHIAZIDE 12.5 MG PO TABS: 12.5000 mg | ORAL_TABLET | Freq: Every day | ORAL | 0 refills | Status: DC

## 2017-08-13 ENCOUNTER — Encounter: Payer: Self-pay | Admitting: Gastroenterology

## 2017-08-18 ENCOUNTER — Encounter (INDEPENDENT_AMBULATORY_CARE_PROVIDER_SITE_OTHER): Payer: Self-pay | Admitting: Physician Assistant

## 2017-08-20 ENCOUNTER — Ambulatory Visit (INDEPENDENT_AMBULATORY_CARE_PROVIDER_SITE_OTHER): Payer: Medicare Other | Admitting: Physician Assistant

## 2017-08-20 ENCOUNTER — Encounter (INDEPENDENT_AMBULATORY_CARE_PROVIDER_SITE_OTHER): Payer: Self-pay

## 2017-08-22 ENCOUNTER — Other Ambulatory Visit: Payer: Self-pay | Admitting: Endocrinology

## 2017-08-29 ENCOUNTER — Telehealth: Payer: Self-pay | Admitting: Endocrinology

## 2017-08-29 ENCOUNTER — Encounter (INDEPENDENT_AMBULATORY_CARE_PROVIDER_SITE_OTHER): Payer: Self-pay

## 2017-08-29 ENCOUNTER — Ambulatory Visit (INDEPENDENT_AMBULATORY_CARE_PROVIDER_SITE_OTHER): Payer: PRIVATE HEALTH INSURANCE | Admitting: Physician Assistant

## 2017-08-29 NOTE — Telephone Encounter (Signed)
Gwyndolyn Saxon is calling from [facility] called stated that they need documentaion For diabetic supply's  Please advise [phone]

## 2017-08-29 NOTE — Telephone Encounter (Signed)
I called and stated that we could not find the fax where this was sent. Pharmacy gave me new fax number & said that they may have to fax back new paperwork to have resigned. Fax # [phone].

## 2017-09-02 ENCOUNTER — Other Ambulatory Visit: Payer: Self-pay | Admitting: Endocrinology

## 2017-09-03 ENCOUNTER — Other Ambulatory Visit (INDEPENDENT_AMBULATORY_CARE_PROVIDER_SITE_OTHER): Payer: Self-pay | Admitting: Physician Assistant

## 2017-09-03 DIAGNOSIS — I10 Essential (primary) hypertension: Secondary | ICD-10-CM

## 2017-09-05 ENCOUNTER — Other Ambulatory Visit (INDEPENDENT_AMBULATORY_CARE_PROVIDER_SITE_OTHER): Payer: Self-pay | Admitting: Physician Assistant

## 2017-09-05 DIAGNOSIS — I10 Essential (primary) hypertension: Secondary | ICD-10-CM

## 2017-09-09 ENCOUNTER — Other Ambulatory Visit: Payer: Self-pay | Admitting: Adult Health

## 2017-09-10 NOTE — Telephone Encounter (Signed)
Sent to the pharmacy by e-scribe. 

## 2017-09-12 ENCOUNTER — Other Ambulatory Visit (INDEPENDENT_AMBULATORY_CARE_PROVIDER_SITE_OTHER): Payer: Self-pay | Admitting: Physician Assistant

## 2017-09-12 ENCOUNTER — Ambulatory Visit (INDEPENDENT_AMBULATORY_CARE_PROVIDER_SITE_OTHER): Payer: Medicare Other | Admitting: Physician Assistant

## 2017-09-12 VITALS — BP 103/67 | HR 60 | Temp 97.8°F | Ht 64.0 in | Wt 191.0 lb

## 2017-09-12 DIAGNOSIS — Z6832 Body mass index (BMI) 32.0-32.9, adult: Secondary | ICD-10-CM | POA: Diagnosis not present

## 2017-09-12 DIAGNOSIS — E669 Obesity, unspecified: Secondary | ICD-10-CM

## 2017-09-12 DIAGNOSIS — I10 Essential (primary) hypertension: Secondary | ICD-10-CM

## 2017-09-12 NOTE — Progress Notes (Signed)
Office: [phone]  /  Fax: [phone]   HPI:   Chief Complaint: OBESITY Breanna Webster is here to discuss her progress with her obesity treatment plan. She is on the Category 3 plan and is following her eating plan approximately 65 % of the time. She states she is walking for 30 minutes 2 times per week. Breanna Webster states she has not been following the meal plan for the past few weeks. She requests to stop following up with [name], because she has not gotten the results she wants with [name]. Her weight is 191 lb (86.6 kg) today and has had a weight gain of 2 pounds over a period of 6 weeks since her last visit. She has lost 7 lbs since starting treatment with [name].  Hypertension Breanna Webster is a 72 y.o. female with hypertension.  Breanna Webster denies chest pain or shortness of breath on exertion. She is working weight loss to help control her blood pressure with the goal of decreasing her risk of heart attack and stroke. Breanna Webster blood pressure is stable.  ALLERGIES: Allergies  Allergen Reactions  . Statins Other (See Comments)    Muscle aches    MEDICATIONS: Current Outpatient Medications on File Prior to Visit  Medication Sig Dispense Refill  . aspirin 81 MG tablet Take 81 mg by mouth daily.      . bisoprolol (ZEBETA) 5 MG tablet Take 1 tablet (5 mg total) by mouth daily. 90 tablet 3  . cholecalciferol (VITAMIN D) 1000 units tablet Take 1,000 Units by mouth daily.    Marland Kitchen escitalopram (LEXAPRO) 20 MG tablet TAKE 1 TABLET BY MOUTH ONCE DAILY 90 tablet 2  . hydrochlorothiazide (HYDRODIURIL) 12.5 MG tablet Take 1 tablet (12.5 mg total) by mouth daily. 30 tablet 0  . hydrochlorothiazide (HYDRODIURIL) 12.5 MG tablet TAKE 1 TABLET(12.5 MG) BY MOUTH DAILY 30 tablet 0  . insulin NPH Human (NOVOLIN N RELION) 100 UNIT/ML injection Inject 0.8 mLs (80 Units total) into the skin every morning. 30 mL 2  . Insulin Syringe-Needle U-100 (BD INSULIN SYRINGE U/F) 31G X 5/16" 1 ML MISC USE TO INJECT INSULIN ONCE DAILY  90 each 2  . levothyroxine (SYNTHROID, LEVOTHROID) 175 MCG tablet Take 1 tablet (175 mcg total) daily before breakfast by mouth. 90 tablet 2  . losartan (COZAAR) 50 MG tablet TAKE 1 TABLET BY MOUTH ONCE DAILY 90 tablet 3  . Multiple Vitamin (MULITIVITAMIN WITH MINERALS) TABS Take 1 tablet by mouth daily.    . nortriptyline (PAMELOR) 25 MG capsule Take 1 capsule (25 mg total) by mouth at bedtime. 90 capsule 3  . ONETOUCH DELICA LANCETS 83J MISC USE TO CHECK BLOOD SUGAR TWICE DAILY 200 each 0  . ONETOUCH VERIO test strip USE TO CHECK BLOOD SUGAR TWICE DAILY 200 each 0  . pantoprazole (PROTONIX) 40 MG tablet Take 1 tablet (40 mg total) by mouth 2 (two) times daily. 60 tablet 3  . vitamin E 400 UNIT capsule Take 400 Units by mouth daily.     No current facility-administered medications on file prior to visit.     PAST MEDICAL HISTORY: Past Medical History:  Diagnosis Date  . Arthritis   . Carotid artery stenosis    Mild  . Colon polyps   . Diabetes mellitus   . Diverticulitis   . Diverticulosis   . GERD (gastroesophageal reflux disease)   . Grave's disease   . Heart murmur    mitral prolapse  . History of colonic polyps 05/22/2017  .  Hyperlipidemia   . Hypertension   . IBS (irritable bowel syndrome)   . Osteopenia   . Thalassemia minor     PAST SURGICAL HISTORY: Past Surgical History:  Procedure Laterality Date  . CHOLECYSTECTOMY    . HIATAL HERNIA REPAIR    . HYSTEROSCOPY     fibroids  . laparoscopy     fibroids    SOCIAL HISTORY: Social History   Tobacco Use  . Smoking status: Never Smoker  . Smokeless tobacco: Never Used  Substance Use Topics  . Alcohol use: No    Alcohol/week: 0.0 oz  . Drug use: No    FAMILY HISTORY: Family History  Adopted: Yes  Problem Relation Age of Onset  . Healthy Son        x 2  . Headache Unknown        Cluster headaches  . Colon cancer Neg Hx   . Pancreatic cancer Neg Hx   . Rectal cancer Neg Hx   . Stomach cancer Neg Hx       ROS: Review of Systems  Constitutional: Negative for weight loss.  Respiratory: Negative for shortness of breath (on exertion).   Cardiovascular: Negative for chest pain.    PHYSICAL EXAM: Blood pressure 103/67, pulse 60, temperature 97.8 F (36.6 C), temperature source Oral, height 5' 4"  (1.626 m), weight 191 lb (86.6 kg), SpO2 97 %. Body mass index is 32.79 kg/m. Physical Exam  Constitutional: She is oriented to person, place, and time. She appears well-developed and well-nourished.  Cardiovascular: Normal rate.  Pulmonary/Chest: Effort normal.  Musculoskeletal: Normal range of motion.  Neurological: She is oriented to person, place, and time.  Skin: Skin is warm and dry.  Psychiatric: She has a normal mood and affect. Her behavior is normal.  Vitals reviewed.   RECENT LABS AND TESTS: BMET    Component Value Date/Time   NA 137 06/14/2017 0826   NA 141 02/26/2017 1116   K 4.1 06/14/2017 0826   CL 100 06/14/2017 0826   CO2 28 06/14/2017 0826   GLUCOSE 175 (H) 06/14/2017 0826   GLUCOSE 135 09/08/2008   BUN 26 (H) 06/14/2017 0826   BUN 22 02/26/2017 1116   CREATININE 0.80 06/14/2017 0826   CALCIUM 10.2 06/14/2017 0826   GFRNONAA 69 02/26/2017 1116   GFRAA 80 02/26/2017 1116   Lab Results  Component Value Date   HGBA1C 7.4 08/05/2017   HGBA1C 6.8 05/06/2017   HGBA1C 6.1 02/08/2017   HGBA1C 7.8 (H) 11/21/2016   HGBA1C 7.0 09/27/2016   Lab Results  Component Value Date   INSULIN 76.3 (H) 11/21/2016   CBC    Component Value Date/Time   WBC 8.2 06/14/2017 0826   RBC 6.04 (H) 06/14/2017 0826   HGB 11.9 (L) 06/14/2017 0826   HGB 10.8 (L) 02/26/2017 1054   HCT 37.3 06/14/2017 0826   HCT 33.3 (L) 02/26/2017 1054   PLT 261.0 06/14/2017 0826   PLT 270 11/21/2016 1113   MCV 61.8 Repeated and verified X2. (L) 06/14/2017 0826   MCV 61 (L) 02/26/2017 1054   MCH 19.9 (L) 02/26/2017 1054   MCH 19.8 (L) 02/17/2014 1705   MCHC 31.9 06/14/2017 0826   RDW 14.7  06/14/2017 0826   RDW 15.9 (H) 02/26/2017 1054   LYMPHSABS 1.9 06/14/2017 0826   LYMPHSABS 1.2 02/26/2017 1054   MONOABS 0.6 06/14/2017 0826   EOSABS 0.3 06/14/2017 0826   EOSABS 0.1 02/26/2017 1054   BASOSABS 0.1 06/14/2017 0826   BASOSABS  0.0 02/26/2017 1054   Iron/TIBC/Ferritin/ %Sat    Component Value Date/Time   IRON 137 05/29/2016 1116   FERRITIN 106.1 05/29/2016 1116   IRONPCTSAT 38.4 05/29/2016 1116   Lipid Panel     Component Value Date/Time   CHOL 159 06/14/2017 0826   CHOL 156 11/21/2016 1113   TRIG 123.0 06/14/2017 0826   HDL 41.20 06/14/2017 0826   HDL 44 11/21/2016 1113   CHOLHDL 4 06/14/2017 0826   VLDL 24.6 06/14/2017 0826   LDLCALC 93 06/14/2017 0826   LDLCALC 91 11/21/2016 1113   LDLDIRECT 98.0 05/24/2014 1343   Hepatic Function Panel     Component Value Date/Time   PROT 7.0 06/14/2017 0826   PROT 6.9 02/26/2017 1116   ALBUMIN 3.9 06/14/2017 0826   ALBUMIN 3.9 02/26/2017 1116   AST 26 06/14/2017 0826   ALT 32 06/14/2017 0826   ALKPHOS 73 06/14/2017 0826   BILITOT 0.7 06/14/2017 0826   BILITOT 0.7 02/26/2017 1116   BILIDIR 0.2 04/13/2016 0804   IBILI 0.5 02/17/2014 1705      Component Value Date/Time   TSH 0.79 08/05/2017 1152   TSH 0.90 05/06/2017 1350   TSH 5.170 (H) 02/26/2017 1116   Results for KEIMANI, LAUFER (MRN [id number]) as of 09/12/2017 13:28  Ref. Range 02/26/2017 11:16  Vitamin D, 25-Hydroxy Latest Ref Range: 30.0 - 100.0 ng/mL 34.2   ASSESSMENT AND PLAN: Essential hypertension  Class 1 obesity with serious comorbidity and body mass index (BMI) of 32.0 to 32.9 in adult, unspecified obesity type  PLAN:  Hypertension We discussed sodium restriction, working on healthy weight loss, and a regular exercise program as the means to achieve improved blood pressure control. Breanna Webster agreed with this plan and agreed to follow up as directed. We will continue to monitor her blood pressure as well as her progress with the above lifestyle  modifications. She will continue her medications as prescribed and will watch for signs of hypotension as she continues her lifestyle modifications.  We spent > than 50% of the 15 minute visit on the counseling as documented in the note.  Obesity Breanna Webster is not currently in the action stage of change. As such, her goal is to maintain weight for now She has agreed to portion control better and make smarter food choices, such as increase vegetables and decrease simple carbohydrates  Breanna Webster has been instructed to work up to a goal of 150 minutes of combined cardio and strengthening exercise per week for weight loss and overall health benefits. We discussed the following Behavioral Modification Strategies today: decrease junk food and planning for success Patient was informed to schedule follow up appointment within 6 months to avoid accumulating additional fees.  Breanna Webster has agreed to follow up with our clinic as needed. She was informed of the importance of frequent follow up visits to maximize her success with intensive lifestyle modifications for her multiple health conditions.   OBESITY BEHAVIORAL INTERVENTION VISIT  Today's visit was # 16 out of 22.  Starting weight: 198 lbs Starting date: 11/21/16 Today's weight : 191 lbs Today's date: 09/12/2017 Total lbs lost to date: 7 (Patients must lose 7 lbs in the first 6 months to continue with counseling)   ASK: We discussed the diagnosis of obesity with Breanna Webster today and Breanna Webster agreed to give [name] permission to discuss obesity behavioral modification therapy today.  ASSESS: Breanna Webster has the diagnosis of obesity and her BMI today is 32.77 Breanna Webster is not in the action stage  of change   ADVISE: Breanna Webster was educated on the multiple health risks of obesity as well as the benefit of weight loss to improve her health. She was advised of the need for long term treatment and the importance of lifestyle modifications.  AGREE: Multiple dietary modification  options and treatment options were discussed and  Breanna Webster agreed to the above obesity treatment plan.   Corey Skains, am acting as transcriptionist for [employer], PA-C I, Lacy Duverney [facility], have reviewed this note and agree with its content

## 2017-10-05 ENCOUNTER — Other Ambulatory Visit (INDEPENDENT_AMBULATORY_CARE_PROVIDER_SITE_OTHER): Payer: Self-pay | Admitting: Adult Health

## 2017-10-05 DIAGNOSIS — I10 Essential (primary) hypertension: Secondary | ICD-10-CM

## 2017-10-07 ENCOUNTER — Other Ambulatory Visit: Payer: Self-pay | Admitting: Obstetrics

## 2017-10-07 ENCOUNTER — Other Ambulatory Visit: Payer: Self-pay

## 2017-10-07 DIAGNOSIS — Z1231 Encounter for screening mammogram for malignant neoplasm of breast: Secondary | ICD-10-CM

## 2017-10-07 MED ORDER — INSULIN NPH (HUMAN) (ISOPHANE) 100 UNIT/ML ~~LOC~~ SUSP: SUBCUTANEOUS | 11 refills | Status: DC

## 2017-10-08 NOTE — Telephone Encounter (Signed)
Last filled by Lacy Duverney, MD.  Please advise.

## 2017-10-08 NOTE — Telephone Encounter (Signed)
Please forward to [facility] [employer] [facility]

## 2017-10-09 NOTE — Telephone Encounter (Signed)
At the patient's request, she no longer follows up our clinic. Please advise about her refill request.  Breanna Webster [name]

## 2017-10-09 NOTE — Telephone Encounter (Signed)
Hello, patient is no longer following up with our clinic, she last stated she will be busy and will not return for follow up. Lacy Duverney [facility]

## 2017-10-10 ENCOUNTER — Telehealth: Payer: Self-pay | Admitting: Endocrinology

## 2017-10-10 NOTE — Telephone Encounter (Signed)
Sent to the pharmacy by e-scribe. 

## 2017-10-10 NOTE — Telephone Encounter (Signed)
Denice Paradise from [facility] calling regarding medication 100 u/mL Relion insulin PA approval they need additional information whether the pt has tried and failed humulin N  # [phone]

## 2017-10-10 NOTE — Telephone Encounter (Signed)
Ok to refill for one year

## 2017-10-14 ENCOUNTER — Telehealth: Payer: Self-pay | Admitting: Endocrinology

## 2017-10-14 NOTE — Telephone Encounter (Signed)
[facility] is the best price I am aware of.

## 2017-10-14 NOTE — Telephone Encounter (Signed)
Patient is needing clarification on what medication she should take.

## 2017-10-14 NOTE — Telephone Encounter (Signed)
I spoke with patient's husband & advised him on Relion isulin. He stated that he would let patient know, but she would probably call back with questions.

## 2017-10-14 NOTE — Telephone Encounter (Signed)
Patient stated her insulin Novolin the price has went up, Dr Loanne Drilling sent in a prescription for Humulin that was also over $100.00 she said. So please advise on what to do.

## 2017-10-16 ENCOUNTER — Telehealth: Payer: Self-pay

## 2017-10-16 NOTE — Telephone Encounter (Signed)
[facility] approved relion novolin n 100 unit/ml from 10/12/17-10/12/18

## 2017-10-18 ENCOUNTER — Encounter: Payer: Self-pay | Admitting: Gastroenterology

## 2017-10-18 ENCOUNTER — Other Ambulatory Visit (HOSPITAL_COMMUNITY): Payer: Self-pay | Admitting: Gastroenterology

## 2017-10-18 ENCOUNTER — Encounter

## 2017-10-18 ENCOUNTER — Ambulatory Visit (INDEPENDENT_AMBULATORY_CARE_PROVIDER_SITE_OTHER): Payer: Medicare Other | Admitting: Gastroenterology

## 2017-10-18 VITALS — BP 110/64 | HR 68 | Ht 64.0 in | Wt 199.5 lb

## 2017-10-18 DIAGNOSIS — K219 Gastro-esophageal reflux disease without esophagitis: Secondary | ICD-10-CM | POA: Diagnosis not present

## 2017-10-18 DIAGNOSIS — R1310 Dysphagia, unspecified: Secondary | ICD-10-CM

## 2017-10-18 DIAGNOSIS — R059 Cough, unspecified: Secondary | ICD-10-CM

## 2017-10-18 DIAGNOSIS — Z860100 Personal history of colon polyps, unspecified: Secondary | ICD-10-CM

## 2017-10-18 DIAGNOSIS — K2270 Barrett's esophagus without dysplasia: Secondary | ICD-10-CM

## 2017-10-18 DIAGNOSIS — R05 Cough: Secondary | ICD-10-CM | POA: Diagnosis not present

## 2017-10-18 NOTE — Progress Notes (Signed)
HPI :  72 year old female with a history of reflux, here for follow-up visit.  Since her last visit she had an upper endoscopy in November 2018. This was remarkable for LA grade B esophagitis, a loose Nissen wrap, and in a regular Z line for which biopsies were obtained and were consistent with Barrett's esophagus. She also had mild gastritis with duodenal erosions. Biopsies were negative for H. Pylori. At that time she also had a colonoscopy in which 28 adenomas were removed, all small. She was referred to genetics and had negative genetic testing for hereditary polyposis syndrome.  After her upper endoscopy she was placed on Dexilant 60 mg once daily (previously on omeprazole with esophagitis). She states this did help her reflux symptoms. I believe she was eventually transition to pantoprazole due to cost. She was placed on this twice daily however states she is taking it once daily. She states reflux does not appear to be bothering her too much as it previously was. She does endorse some periodic coughing and wheezing which has been bothering her. She is not sure if this related to reflux or not. She denies any obvious heartburn or regurgitation which bothers her. She also endorses coughing/choking attacks with swallows, in particular liquids, feels like it is going down the wrong way. She also has episodic congestion with postnasal drip from her nose which bothers her. She is otherwise eating okay. Denies any other dysphagia. No nausea or vomiting. She has questions about long-term implications of Barrett's esophagus.  Endoscopic history: EGD 03/20/2017 - irregular z line, LA grade B esophagitis, small paraesophageal hernia, loose Nissen wrap, mild gastritis, duodenal erosions - path c/w Barrett's, H pylori negative  Colonoscopy 03/20/2017 - pancolonic diverticulosis, 28 polyps removed -  EGD 2002 - suspected segment of BE, no BE on biopsies EGD 2006 - esophagitis and gastritis, no evidence of  BE Colonoscopy 5/05 - diverticulosis Colonoscopy 4/15 - 4 small adenomas  DEXA 03/2017 - stable osteopenia   Past Medical History:  Diagnosis Date  . Arthritis   . Carotid artery stenosis    Mild  . Colon polyps   . Diabetes mellitus   . Diverticulitis   . Diverticulosis   . GERD (gastroesophageal reflux disease)   . Grave's disease   . Heart murmur    mitral prolapse  . History of colonic polyps 05/22/2017  . Hyperlipidemia   . Hypertension   . IBS (irritable bowel syndrome)   . Osteopenia   . Thalassemia minor      Past Surgical History:  Procedure Laterality Date  . CHOLECYSTECTOMY    . HIATAL HERNIA REPAIR    . HYSTEROSCOPY     fibroids  . laparoscopy     fibroids   Family History  Adopted: Yes  Problem Relation Age of Onset  . Healthy Son        x 2  . Headache Unknown        Cluster headaches  . Colon cancer Neg Hx   . Pancreatic cancer Neg Hx   . Rectal cancer Neg Hx   . Stomach cancer Neg Hx    Social History   Tobacco Use  . Smoking status: Never Smoker  . Smokeless tobacco: Never Used  Substance Use Topics  . Alcohol use: No    Alcohol/week: 0.0 oz  . Drug use: No   Current Outpatient Medications  Medication Sig Dispense Refill  . aspirin 81 MG tablet Take 81 mg by mouth daily.      Marland Kitchen  bisoprolol (ZEBETA) 5 MG tablet Take 1 tablet (5 mg total) by mouth daily. 90 tablet 3  . cholecalciferol (VITAMIN D) 1000 units tablet Take 1,000 Units by mouth daily.    Marland Kitchen escitalopram (LEXAPRO) 20 MG tablet TAKE 1 TABLET BY MOUTH ONCE DAILY 90 tablet 2  . hydrochlorothiazide (HYDRODIURIL) 12.5 MG tablet TAKE 1 TABLET(12.5 MG) BY MOUTH DAILY 90 tablet 3  . insulin NPH Human (NOVOLIN N RELION) 100 UNIT/ML injection Inject 0.8 mLs (80 Units total) into the skin every morning. (Patient taking differently: Inject 90 Units into the skin every morning. ) 30 mL 2  . Insulin Syringe-Needle U-100 (BD INSULIN SYRINGE U/F) 31G X 5/16" 1 ML MISC USE TO INJECT INSULIN  ONCE DAILY 90 each 2  . levothyroxine (SYNTHROID, LEVOTHROID) 175 MCG tablet Take 1 tablet (175 mcg total) daily before breakfast by mouth. 90 tablet 2  . losartan (COZAAR) 50 MG tablet TAKE 1 TABLET BY MOUTH ONCE DAILY 90 tablet 3  . Multiple Vitamin (MULITIVITAMIN WITH MINERALS) TABS Take 1 tablet by mouth daily.    . nortriptyline (PAMELOR) 25 MG capsule Take 1 capsule (25 mg total) by mouth at bedtime. 90 capsule 3  . ONETOUCH DELICA LANCETS 03K MISC USE TO CHECK BLOOD SUGAR TWICE DAILY 200 each 0  . ONETOUCH VERIO test strip USE TO CHECK BLOOD SUGAR TWICE DAILY 200 each 0  . pantoprazole (PROTONIX) 40 MG tablet Take 1 tablet (40 mg total) by mouth 2 (two) times daily. (Patient taking differently: Take 40 mg by mouth daily. ) 60 tablet 3  . vitamin E 400 UNIT capsule Take 400 Units by mouth daily.     No current facility-administered medications for this visit.    Allergies  Allergen Reactions  . Statins Other (See Comments)    Muscle aches     Review of Systems: All systems reviewed and negative except where noted in HPI.   Lab Results  Component Value Date   WBC 8.2 06/14/2017   HGB 11.9 (L) 06/14/2017   HCT 37.3 06/14/2017   MCV 61.8 Repeated and verified X2. (L) 06/14/2017   PLT 261.0 06/14/2017    Lab Results  Component Value Date   CREATININE 0.80 06/14/2017   BUN 26 (H) 06/14/2017   NA 137 06/14/2017   K 4.1 06/14/2017   CL 100 06/14/2017   CO2 28 06/14/2017   Lab Results  Component Value Date   ALT 32 06/14/2017   AST 26 06/14/2017   ALKPHOS 73 06/14/2017   BILITOT 0.7 06/14/2017   Lab Results  Component Value Date   IRON 137 05/29/2016   FERRITIN 106.1 05/29/2016     Physical Exam: BP 110/64 (BP Location: Left Arm, Patient Position: Sitting, Cuff Size: Normal)   Pulse 68   Ht 5' 4"  (1.626 m)   Wt 199 lb 8 oz (90.5 kg)   BMI 34.24 kg/m  Constitutional: Pleasant,well-developed, female in no acute distress. HEENT: Normocephalic and atraumatic.  Conjunctivae are normal. No scleral icterus. Neck supple.  Cardiovascular: Normal rate, regular rhythm.  Pulmonary/chest: Effort normal and breath sounds normal. No wheezing, rales or rhonchi. Abdominal: Soft, nondistended, nontender. There are no masses palpable. No hepatomegaly. Extremities: no edema Lymphadenopathy: No cervical adenopathy noted. Neurological: Alert and oriented to person place and time. Skin: Skin is warm and dry. No rashes noted. Psychiatric: Normal mood and affect. Behavior is normal.   ASSESSMENT AND PLAN: 72 year old female here for reassessment of the following issues:  GERD / Barrett's esophagus -  generally doing okay on once daily Protonix at 40 mg, although having some coughing symptoms as outlined which are possibly related to reflux but I think more likely other etiologies outlined below. I discussed what Barrett's esophagus is with her, and that she has at higher risk for esophageal cancer. That being said her Barrett segment is extremely short and the overall risk for cancer is thought to be quite low. I'm recommending a surveillance endoscopy 3 years from her last exam. I otherwise counseled her extensively on the long term associated risks of chronic PPI use. Given her Barrett's esophagus long-term PPI is recommended to minimize risk for esophageal cancer, she will use the lowest dose needed to control her reflux symptoms. She will continue present dosing and agreed with the plan.  Coughing / oropharygeal dysphagia / postnasal drip - she endorses coughing with swallows at times, raising the concern for aspiration. Given these symptoms I'm recommending a modified barium swallow with speech pathology evaluation, ensure no evidence of aspiration. She also has other times where she has significant nasal congestion and rhinorrhea concerning for postnasal drip which could be causing cough. I offered her an Atrovent nasal spray to help with postnasal drip, she declined  initially. She wishes to wait results of modified barium swallow first. If cough persists and modified barium swallows normal, and treatment of postnasal drip is not improving things, would consider pH study with impedance to see if cough is related to reflux.  History of colon polyps - greater than 20 adenomas on her last colonoscopy. Genetic testing negative. We are planning colonoscopy for surveillance 1 year from her last exam, due November 2019. She agreed.  [name] Cellar, MD [facility]

## 2017-10-18 NOTE — Patient Instructions (Addendum)
If you are age 74 or older, your body mass index should be between 23-30. Your Body mass index is 34.24 kg/m. If this is out of the aforementioned range listed, please consider follow up with your Primary Care Provider.  If you are age 25 or younger, your body mass index should be between 19-25. Your Body mass index is 34.24 kg/m. If this is out of the aformentioned range listed, please consider follow up with your Primary Care Provider.   You have been scheduled for a modified barium swallow on Wednesday, 10-30-17 at 1:00pm. Please arrive 15 minutes prior to your test for registration. You will go to [facility] Radiology (1st Floor) for your appointment. Should you need to cancel or reschedule your appointment, please contact or [phone] [place]). _____________________________________________________________________ A Modified Barium Swallow Study, or MBS, is a special x-ray that is taken to check swallowing skills. It is carried out by a Stage manager and a Psychologist, clinical (SLP). During this test, yourmouth, throat, and esophagus, a muscular tube which connects your mouth to your stomach, is checked. The test will help you, your doctor, and the SLP plan what types of foods and liquids are easier for you to swallow. The SLP will also identify positions and ways to help you swallow more easily and safely. What will happen during an MBS? You will be taken to an x-ray room and seated comfortably. You will be asked to swallow small amounts of food and liquid mixed with barium. Barium is a liquid or paste that allows images of your mouth, throat and esophagus to be seen on x-ray. The x-ray captures moving images of the food you are swallowing as it travels from your mouth through your throat and into your esophagus. This test helps identify whether food or liquid is entering your lungs (aspiration). The test also shows which part of your mouth or throat lacks strength or coordination to  move the food or liquid in the right direction. This test typically takes 30 minutes to 1 hour to complete. _______________________________________________________________________  Thank you for entrusting me with your care and for choosing [facility], Dr. [place] Cellar

## 2017-10-22 ENCOUNTER — Other Ambulatory Visit: Payer: Self-pay | Admitting: Endocrinology

## 2017-10-30 ENCOUNTER — Ambulatory Visit (HOSPITAL_COMMUNITY)
Admission: RE | Admit: 2017-10-30 | Discharge: 2017-10-30 | Disposition: A | Discharge status: Home / Self Care | Payer: Medicare Other | Source: Ambulatory Visit | Attending: Gastroenterology | Admitting: Gastroenterology

## 2017-10-30 DIAGNOSIS — R05 Cough: Secondary | ICD-10-CM | POA: Insufficient documentation

## 2017-10-30 DIAGNOSIS — R1312 Dysphagia, oropharyngeal phase: Secondary | ICD-10-CM | POA: Diagnosis not present

## 2017-10-30 DIAGNOSIS — K2270 Barrett's esophagus without dysplasia: Secondary | ICD-10-CM

## 2017-10-30 DIAGNOSIS — R0982 Postnasal drip: Secondary | ICD-10-CM | POA: Insufficient documentation

## 2017-10-30 DIAGNOSIS — R1310 Dysphagia, unspecified: Secondary | ICD-10-CM | POA: Diagnosis not present

## 2017-10-30 DIAGNOSIS — Z86010 Personal history of colonic polyps: Secondary | ICD-10-CM

## 2017-10-30 DIAGNOSIS — K219 Gastro-esophageal reflux disease without esophagitis: Secondary | ICD-10-CM

## 2017-10-30 DIAGNOSIS — R059 Cough, unspecified: Secondary | ICD-10-CM

## 2017-10-30 NOTE — Therapy (Signed)
Modified Barium Swallow Progress Note  Patient Details  Name: Breanna Webster MRN: [id number] Date of Birth: [birth year]  Today's Date: 10/30/2017  Modified Barium Swallow completed.  Full report located under Chart Review in the Imaging Section.  Brief recommendations include the following:  Clinical Impression  Pt presents with normal oral and pharyngeal swallow. No oral deficits, delay, or residue noted. Swallow reflex is timely with adequate airway protection. No post-swallow residue noted on any consistency.   Esophageal sweep revealed stasis within the cervical esophagus, consistent with known esophageal issues. Pt/husband were provided with written strategies for behavioral and dietary strategies for management of esophageal dysmotility. Further esophageal work up may be indicated iff adherence to these suggestions are not beneficial.   No further ST intervention is recommended at this time.   Swallow Evaluation Recommendations  Recommended Consults: Consider esophageal assessment   SLP Diet Recommendations: Regular solids;Thin liquid   Liquid Administration via: Cup;Straw   Medication Administration: Whole meds with liquid   Supervision: Patient able to self feed   Compensations: Minimize environmental distractions;Slow rate;Small sips/bites;Follow solids with liquid   Postural Changes: Remain semi-upright after after feeds/meals (Comment)   Oral Care Recommendations: Oral care BID     Breanna Webster B. Quentin Ore, [facility], [place] Speech Language Pathologist [phone]   Breanna Webster 10/30/2017,2:47 PM

## 2017-11-01 ENCOUNTER — Ambulatory Visit
Admission: RE | Admit: 2017-11-01 | Discharge: 2017-11-01 | Disposition: A | Discharge status: Home / Self Care | Payer: Medicare Other | Source: Ambulatory Visit | Attending: Obstetrics | Admitting: Obstetrics

## 2017-11-01 DIAGNOSIS — Z1231 Encounter for screening mammogram for malignant neoplasm of breast: Secondary | ICD-10-CM

## 2017-11-04 ENCOUNTER — Other Ambulatory Visit: Payer: Self-pay | Admitting: Obstetrics

## 2017-11-04 ENCOUNTER — Other Ambulatory Visit: Payer: Self-pay | Admitting: Gastroenterology

## 2017-11-04 DIAGNOSIS — R928 Other abnormal and inconclusive findings on diagnostic imaging of breast: Secondary | ICD-10-CM

## 2017-11-06 ENCOUNTER — Ambulatory Visit
Admission: RE | Admit: 2017-11-06 | Discharge: 2017-11-06 | Disposition: A | Discharge status: Home / Self Care | Payer: Medicare Other | Source: Ambulatory Visit | Attending: Obstetrics | Admitting: Obstetrics

## 2017-11-06 ENCOUNTER — Ambulatory Visit: Payer: PRIVATE HEALTH INSURANCE

## 2017-11-06 DIAGNOSIS — R928 Other abnormal and inconclusive findings on diagnostic imaging of breast: Secondary | ICD-10-CM | POA: Diagnosis not present

## 2017-11-11 ENCOUNTER — Encounter: Payer: Self-pay | Admitting: Internal Medicine

## 2017-11-11 ENCOUNTER — Ambulatory Visit (INDEPENDENT_AMBULATORY_CARE_PROVIDER_SITE_OTHER): Payer: Medicare Other | Admitting: Internal Medicine

## 2017-11-11 VITALS — BP 116/76 | HR 73 | Ht 65.0 in | Wt 200.4 lb

## 2017-11-11 DIAGNOSIS — R058 Other specified cough: Secondary | ICD-10-CM

## 2017-11-11 DIAGNOSIS — R0609 Other forms of dyspnea: Secondary | ICD-10-CM | POA: Diagnosis not present

## 2017-11-11 DIAGNOSIS — R05 Cough: Secondary | ICD-10-CM

## 2017-11-11 DIAGNOSIS — R0600 Dyspnea, unspecified: Secondary | ICD-10-CM

## 2017-11-11 NOTE — Assessment & Plan Note (Addendum)
Echo 12/14/15 Left ventricle: The cavity size was normal. Systolic function was   normal. The estimated ejection fraction was in the range of 60%   to 65%. Wall motion was normal; there were no regional wall   motion abnormalities. Doppler parameters are consistent with   abnormal left ventricular relaxation (grade 1 diastolic   dysfunction). - Aortic valve: Trileaflet; moderately calcified leaflets. There   was mild stenosis. There was mild regurgitation. Mean gradient   (S): 15 mm Hg. Peak gradient (S): 29 mm Hg. - Mitral valve: Moderately calcified annulus. Mildly calcified   leaflets . There was no significant regurgitation. - Left atrium: The atrium was mildly to moderately dilated. - Right ventricle: The cavity size was normal. Systolic function   was normal. Myoview  3/19/ 2018  Nl EF/ no CAD - Spirometry 07/19/2016   FEV1 1.96 (85%)  Ratio 94  rec restart gerd rx/ treat back pain  -  Spirometry 11/11/2017  FEV1 1.76 (78%)  Ratio 75  slt abn f/v contour  - 11/11/2017  Walked RA x 3 laps @ 185 ft each stopped due to  End of study, fast pace, no  desat / stopped once due to sob      Symptoms are markedly disproportionate to objective findings and not clear to what extent this is actually a pulmonary  problem but pt does appear to have difficult to sort out respiratory symptoms of unknown origin for which  DDX  = almost all start with A and  include Adherence, Ace Inhibitors, Acid Reflux, Active Sinus Disease, Alpha 1 Antitripsin deficiency, Anxiety masquerading as Airways dz,  ABPA,  Allergy(esp in young), Aspiration (esp in elderly), Adverse effects of meds,  Active smokers, A bunch of PE's/clot burden (a few small clots can't cause this syndrome unless there is already severe underlying pulm or vascular dz with poor reserve),  Anemia or thyroid disorder, plus two Bs  = Bronchiectasis and Beta blocker use..and one C= CHF    Adherence is always the initial "prime suspect" and is a  multilayered concern that requires a "trust but verify" approach in every patient - starting with knowing how to use medications, especially inhalers, correctly, keeping up with refills and understanding the fundamental difference between maintenance and prns vs those medications only taken for a very short course and then stopped and not refilled.  - rec return with all meds in hand using a trust but verify approach to confirm accurate Medication  Reconciliation The principal here is that until we are certain that the  patients are doing what we've asked, it makes no sense to ask them to do more.    ? Acid (or non-acid) GERD > always difficult to exclude as up to 75% of pts in some series report no assoc GI/ Heartburn symptoms and she has similar symptoms now to what she had in 90's resolved p NF which has now unwrapped per EGD 02/2017 >>>   rec continue max (24h)  acid suppression and diet restrictions/ reviewed     ? Allergy/ asthma > allergy profile unimpressive,  no real change in spirometry since last study, no noct symptoms or assoc rhinitis makes this less likely though cough have pnds and not clear she ever took 1st gen H1 blockers per guidelines    ? Aspiration > neg on ST eval 10/30/17   ? Anxiety/depression/deconditioning  > usually at the bottom of this list of usual suspects but should be much higher on this pt's based  on H and P and note already  on psychotropics and may interfere with adherence and also interpretation of response or lack thereof to symptom management which can be quite subjective.  ? Anemia/ thyroid dz > needs recheck if not done  ? BB effects > unlikely on bisoprolol  ? CHF / valvular ht dz > needs repeat echo then CPST p reconditioning program -see avs for instructions unique to this ov        I had an extended discussion with the patient reviewing all relevant studies completed to date and  lasting 25 minutes of a 40  minute office visit  To re-establish p  over a year's absence addressing  non-specific but potentially very serious refractory respiratory symptoms of uncertain and potentially multiple  etiologies.   Each maintenance medication was reviewed in detail including most importantly the difference between maintenance and prns and under what circumstances the prns are to be triggered using an action plan format that is not reflected in the computer generated alphabetically organized AVS.    Please see AVS for specific instructions unique to this office visit that I personally wrote and verbalized to the the pt in detail and then reviewed with pt  by my nurse highlighting any changes in therapy/plan of care  recommended at today's visit.

## 2017-11-11 NOTE — Patient Instructions (Addendum)
To get the most out of exercise, you need to be continuously aware that you are short of breath, but never out of breath, for 30 minutes daily. As you improve, it will actually be easier for you to do the same amount of exercise  in  30 minutes so always push to the level where you are short of breath.      We will arrange a cpst in one month no sooner and I will call you with the results ? Anemia/ thyroid dz > needs recheck if not done   needs repeat echo prior to  CPST

## 2017-11-11 NOTE — Progress Notes (Signed)
Subjective:     Patient ID: DEMETRA MOYA, female   DOB: 02-20-1946     MRN: [id number]    Brief patient profile:  36   yowf never smoker with pnds as child allergy tested as child doesn't know details, never took shots,  outgrew completely then symptoms resumed in 90's after moved to [place] from [facility] with coughing that improved p NF late 90s  but still had drainage variably  severe no pattern as to time of year then much worse since summer 2016 with onset of cough/wheeze May 2017 referred to pulmonary clinic 10/14/2015 by Dr Betty [name].      History of Present Illness  10/14/2015 1st [facility] office visit/ Wert   Chief Complaint  Patient presents with  . Pulmonary Consult    Referred by Dr. Betty [name]. Pt c/o cough and wheezing x 5 wks. Cough is occ prod with minimal green sputum.   acute onset cough > green mucus one week p husband's uri May 2017 >  rx with abx/ prednisone improved then worse 2 days p finished but mostly productive  white mucus.  If doesn't take the codeine cough med then coughs worse at hs.  No assoc sob/ some wheeze better with saba  rec The key to effective treatment for your cough is eliminating the non-stop cycle of cough you're stuck in long enough to let your airway heal completely and then see if there is anything still making you cough once you stop the cough suppression, but this should take no more than 5 days to figure out Eliminate all coughing for 3 days with your codiene syrup and then switch delsym 2 tsp every 12 hours  Prednisone 10 mg take  4 each am x 2 days,   2 each am x 2 days,  1 each am x 2 days and stop (this is to eliminate allergies and inflammation from coughing) Protonix (pantoprazole) Take 30-60 min before first meal of the day and Pepcid 20 mg one bedtime plus chlorpheniramine 4 mg x 2 at bedtime (both available over the counter)  until cough is completely gone for at least a week without the need for cough suppression GERD  diet    11/02/2015 acute extended ov/Wert re: cough since may 2017   Chief Complaint  Patient presents with  . Pulmonary Consult    Cough has only improved some. She is still wheezing and her cough is prod with green sputum.    worse in am and at bedtime and slt green mucus again p initially turning white  rec Please see patient coordinator before you leave today  to schedule sinus CT and we will call results  For drainage / throat tickle try take CHLORPHENIRAMINE  4 mg - take one every 4 hours as needed -  Please remember to go to the lab  department downstairs for your tests - we will call you with the results when they are available. Until cough gone 100% with no need for any cough medicine > prilosec or pantoprazole 40 mg proton pump inhibitor and continue pepcid 20 mg at hs Take delsym two tsp every 12 hours and supplement if needed with tylenol #3  up to 2 every 4 hours to suppress the urge to cough.  Once you have eliminated the cough for 3 straight days try reducing the tylenol #3  first,  then the delsym as tolerated.   Stop toprol (lopressor/metaprolol)  And start bisoprolol 5 mg one daily  12/13/2015  f/u ov/Wert re: cyclical cough/ gerd Chief Complaint  Patient presents with  . Follow-up    Cough has resolved completely and no more wheezing for the past month. No new co's.    no cough at all/ on no gerd meds/ on bisoprolol instead of metaprolol and doing great  rec Remember at the onset of any respiratory complaint :  Try prilosec otc 52m  Take 30-60 min before first meal of the day and Pepcid ac (famotidine) 20 mg one @  bedtime until completely better for a week For drainage / throat tickle try take CHLORPHENIRAMINE  4 mg - take one every 4 hours as needed -          07/19/2016 acute extended ov/Wert re: sob no longer on gerd rx "I don't have any HB"  Chief Complaint  Patient presents with  . Acute Visit    Increased SOB x 3 months. She states that she gets  SOB with any exertion at all.    new problem =  Sob with exertion lifelong always the last one off the playground and trouble with steps and hills as long as she can recall Baseline = walking up to 45 min flat at [employer] [facility] s stopping  and 30 min on bicycle at "moderate pace and resistance"  Took some time off due to back pain and now can't walk 5 min with back pain and sob with back pain so severe today in w/c and pain shooting down R leg to foot s/p failed injections so far Never on inhalers  rec Nexium 40 (or prilosec 20 x 2) Take 30- 60 min before your first and last meals of the day  GERD  Diet     03/20/17 EGD [place]:  Loose NF, Barrett's    11/11/2017  Extended f/u ov/Wert re: doe/ recurrent cough on gerd rx  Chief Complaint  Patient presents with  . Follow-up    Pt states she still has the cough. Pt states she will have spasms that will bring on her cough. Pt also has complaints of SOB with exertion and has chest tightness.  Dyspnea:  MMRC3 = can't walk 100 yards even at a slow pace at a flat grade s stopping due to sob  - only able to shop by pushing cart around "anywhere" but with freq stops  Cough: dry sporadic daytime / eating and talking seem to trigger it mostly     SABA use: none 02: none      No obvious day to day or daytime variability or assoc excess/ purulent sputum or mucus plugs or hemoptysis or cp  or subjective wheeze or overt sinus or hb symptoms.   Sleeping 1-2 pillows without nocturnal  or early am exacerbation  of respiratory  c/o's or need for noct saba. Also denies any obvious fluctuation of symptoms with weather or environmental changes or other aggravating or alleviating factors except as outlined above   No unusual exposure hx or h/o childhood pna/ asthma or knowledge of premature birth.  Current Allergies, Complete Past Medical History, Past Surgical History, Family History, and Social History were reviewed in [facility].  ROS  The following are not active complaints unless bolded Hoarseness, sore throat, dysphagia, dental problems, itching, sneezing,  nasal congestion or discharge of excess mucus or purulent secretions, ear ache,   fever, chills, sweats, unintended wt loss or wt gain, classically pleuritic or exertional cp,  orthopnea pnd or  arm/hand swelling  or leg swelling, presyncope, palpitations, abdominal pain, anorexia, nausea, vomiting, diarrhea  or change in bowel habits or change in bladder habits, change in stools or change in urine, dysuria, hematuria,  rash, arthralgias, visual complaints, headache, numbness, weakness or ataxia or problems with walking or coordination,  change in mood or  memory.        Current Meds  Medication Sig  . aspirin 81 MG tablet Take 81 mg by mouth daily.    . bisoprolol (ZEBETA) 5 MG tablet Take 1 tablet (5 mg total) by mouth daily.  . cholecalciferol (VITAMIN D) 1000 units tablet Take 1,000 Units by mouth daily.  Marland Kitchen escitalopram (LEXAPRO) 20 MG tablet TAKE 1 TABLET BY MOUTH ONCE DAILY  . hydrochlorothiazide (HYDRODIURIL) 12.5 MG tablet TAKE 1 TABLET(12.5 MG) BY MOUTH DAILY  . insulin NPH Human (NOVOLIN N RELION) 100 UNIT/ML injection Inject 0.8 mLs (80 Units total) into the skin every morning. (Patient taking differently: Inject 90 Units into the skin every morning. )  . Insulin Syringe-Needle U-100 (BD INSULIN SYRINGE U/F) 31G X 5/16" 1 ML MISC USE TO INJECT INSULIN ONCE DAILY  . losartan (COZAAR) 50 MG tablet TAKE 1 TABLET BY MOUTH ONCE DAILY  . Multiple Vitamin (MULITIVITAMIN WITH MINERALS) TABS Take 1 tablet by mouth daily.  [name] DELICA LANCETS 82L MISC USE TO CHECK BLOOD SUGAR TWICE DAILY  . ONETOUCH VERIO test strip USE TO CHECK BLOOD SUGAR TWICE DAILY  . pantoprazole (PROTONIX) 40 MG tablet TAKE 1 TABLET BY MOUTH TWICE A DAY  . SYNTHROID 175 MCG tablet TAKE 1 TABLET BY MOUTH ONCE DAILY.  . vitamin E 400 UNIT capsule Take 400 Units  by mouth daily.                     Objective:   Physical Exam    amb wf  nad    11/11/2017        204  07/19/2016        196 per pt  12/13/2015       193   11/02/2015       193  10/14/15 192 lb (87.091 kg)  10/10/15 190 lb (86.183 kg)  10/07/15 189 lb 4 oz (85.843 kg)    Vital signs reviewed - Note on arrival 02 sats  93% on RA       HEENT: nl dentition, turbinates bilaterally, and oropharynx. Nl external ear canals without cough reflex   NECK :  without JVD/Nodes/TM/ nl carotid upstrokes bilaterally   LUNGS: no acc muscle use,  Nl contour chest which is clear to A and P bilaterally with  cough on insp  maneuvers   CV:  RRR  II-III/VI Sem   no s3 or   increase in P2, and no edema   ABD:  soft and nontender with nl inspiratory excursion in the supine position. No bruits or organomegaly appreciated, bowel sounds nl  MS:  Nl gait/ ext warm without deformities, calf tenderness, cyanosis or clubbing No obvious joint restrictions   SKIN: warm and dry without lesions    NEURO:  alert, approp, nl sensorium with  no motor or cerebellar deficits apparent.       chart review including care everywhere : no recent labs/cxr                   Assessment:

## 2017-11-12 ENCOUNTER — Encounter: Payer: Self-pay | Admitting: Internal Medicine

## 2017-11-12 ENCOUNTER — Telehealth: Payer: Self-pay | Admitting: *Deleted

## 2017-11-12 ENCOUNTER — Telehealth: Payer: Self-pay | Admitting: Internal Medicine

## 2017-11-12 DIAGNOSIS — R0600 Dyspnea, unspecified: Secondary | ICD-10-CM

## 2017-11-12 DIAGNOSIS — R0609 Other forms of dyspnea: Principal | ICD-10-CM

## 2017-11-12 NOTE — Assessment & Plan Note (Addendum)
Cyclical cough rx 05/21/473 >>> repeat 11/03/2015 as did not follow instructions the first time - FENO 11/02/2015  =   6 - Allergy profile 11/02/2015 >  Eos 0.1 /  IgE  137 dust only  - Sinus CT 11/09/2015 > Mucosal thickening inferior right maxillary antrum. Visualized paranasal sinuses elsewhere clear. - 12/13/2015 all better off all meds p cyclical cough rx/ gerd rx  - Allergy profile 07/19/16 >  Eos 0.2 /  IgE  192 RAST pos dust only    rec rechallenge with 1st gen H1 blockers per guidelines  / continue gerd rx

## 2017-11-12 NOTE — Telephone Encounter (Signed)
Spoke with the pt and notified of recs per MW  She states that she has had labs recently and does not think this needs to be repeated  She is okay with cxr and labs, and they have been ordered  She asked that the ECHO be scheduled late august, b/c she is going to [place] and will not return until then

## 2017-11-12 NOTE — Telephone Encounter (Signed)
Spoke with pt and advised or Dr Gustavus Bryant recommendations:    To get the most out of exercise, you need to be continuously aware that you are short of breath, but never out of breath, for 30 minutes daily. As you improve, it will actually be easier for you to do the same amount of exercise  in  30 minutes so always push to the level where you are short of breath.        We will arrange a cpst in one month no sooner and I will call you with the results ? Anemia/ thyroid dz > needs recheck if not done   Pt verbalized understanding.  Nothing further needed .

## 2017-11-12 NOTE — Telephone Encounter (Signed)
-----   Message from Tanda Rockers, MD sent at 11/12/2017  5:00 AM EDT ----- p chart review, prior to cpst will need: ? Anemia/ thyroid dz > needs recheck if not done  needs repeat echo prior to  CPST   And cxr. All for doe   If had them outside the system in last 3 months can use them but I don't see anywhere including care everywhere

## 2017-11-17 ENCOUNTER — Other Ambulatory Visit (INDEPENDENT_AMBULATORY_CARE_PROVIDER_SITE_OTHER): Payer: Self-pay | Admitting: Adult Health

## 2017-11-19 ENCOUNTER — Other Ambulatory Visit: Payer: Self-pay | Admitting: Adult Health

## 2017-11-19 NOTE — Telephone Encounter (Signed)
Sent to the pharmacy by e-scribe. 

## 2017-11-19 NOTE — Telephone Encounter (Signed)
Medication filled on 11/19/17

## 2017-11-19 NOTE — Telephone Encounter (Signed)
Copied from [place] [id number]. Topic: Quick Communication - Rx Refill/Question >> Nov 19, 2017  1:44 PM Cecelia Byars, NT wrote: Medication:  bisoprolol (ZEBETA) 5 MG tablet   Has the patient contacted their pharmacy? yes  (Agent: If no, request that the patient contact the pharmacy for the refill. (Agent: If yes, when and what did the pharmacy advise?  Preferred Pharmacy (with phone number or street name [facility] [phone] [address], [place] [address] [place] [phone] (Phone) [phone] (Fax)      Agent: Please be advised that RX refills may take up to 3 business days. We ask that you follow-up with your pharmacy.

## 2017-11-26 ENCOUNTER — Other Ambulatory Visit: Payer: Self-pay | Admitting: Adult Health

## 2017-11-27 ENCOUNTER — Telehealth: Payer: Self-pay | Admitting: Adult Health

## 2017-11-27 MED ORDER — BISOPROLOL FUMARATE 5 MG PO TABS: 5.0000 mg | ORAL_TABLET | Freq: Every day | ORAL | 1 refills | Status: DC

## 2017-11-27 NOTE — Telephone Encounter (Signed)
Denied.  Filled on 11/19/17.

## 2017-11-27 NOTE — Telephone Encounter (Signed)
Copied from [place] [id number]. Topic: Quick Communication - Rx Refill/Question >> Nov 27, 2017 12:04 PM Cecelia Byars, NT wrote: Medication: bisoprolol (ZEBETA) 5 MG tablet  Has the patient contacted their pharmacy? yes  (Agent: If no, request that the patient contact the pharmacy for the refill. (Agent: If yes, when and what did the pharmacy advise?  Preferred Pharmacy (with phone number or street name [facility] [phone] [address], [place] AT [place] [phone] (Phone) [phone] (Fax)  The pharmacy told the patient they never received the prescription on 11/19/17     Agent: Please be advised that RX refills may take up to 3 business days. We ask that you follow-up with your pharmacy.

## 2017-11-27 NOTE — Telephone Encounter (Signed)
Phone call to pt.  Advised that her Bisoprolol had been filled on 11/19/17; #90; RF x 1.  The pt. stated that she has spoken with [facility], and advised that they have not rec'd a new order for refill.    [place] on the pt's. behalf.  Was advised that a new order was not rec'd on 11/19/17.  Gave verbal order to the Pharmacist for Bisoprolol 5 mg; take 1 tab. po, qd; #90; RF x 1.     Notified pt. that Rx has been called to pharmacy, and verbal order was given for refill authorization.  Pt. verb. understanding and expressed appreciation.

## 2017-11-29 ENCOUNTER — Other Ambulatory Visit: Payer: Self-pay | Admitting: Endocrinology

## 2017-12-01 ENCOUNTER — Other Ambulatory Visit: Payer: Self-pay | Admitting: Gastroenterology

## 2017-12-05 ENCOUNTER — Encounter: Payer: Self-pay | Admitting: Endocrinology

## 2017-12-05 ENCOUNTER — Ambulatory Visit (INDEPENDENT_AMBULATORY_CARE_PROVIDER_SITE_OTHER): Payer: Medicare Other | Admitting: Endocrinology

## 2017-12-05 VITALS — BP 132/82 | HR 74 | Ht 65.0 in | Wt 201.6 lb

## 2017-12-05 DIAGNOSIS — E118 Type 2 diabetes mellitus with unspecified complications: Secondary | ICD-10-CM

## 2017-12-05 LAB — POCT GLYCOSYLATED HEMOGLOBIN (HGB A1C): Hemoglobin A1C: 8.4 % — AB (ref 4.0–5.6)

## 2017-12-05 MED ORDER — INSULIN NPH (HUMAN) (ISOPHANE) 100 UNIT/ML ~~LOC~~ SUSP: 100.0000 [IU] | SUBCUTANEOUS | 11 refills | Status: DC

## 2017-12-05 NOTE — Patient Instructions (Addendum)
Please increase the insulin to 100 units each norming.  After each of the steroid shots, please check to see if the blood sugar goes higher.  If it is over 200, please call us check your blood sugar twice a day.  vary the time of day when you check, between before the 3 meals, and at bedtime.  also check if you have symptoms of your blood sugar being too high or too low.  please keep a record of the readings and bring it to your next appointment here (or you can bring the meter itself).  You can write it on any piece of paper.  please call us sooner if your blood sugar goes below 70, or if you have a lot of readings over 200.   Please come back for a follow-up appointment in 2 months.

## 2017-12-05 NOTE — Progress Notes (Signed)
Subjective:    Patient ID: Breanna Webster, female    DOB: 1946/01/12, 72 y.o.   MRN: [id number]  HPI Pt returns for f/u of diabetes mellitus: DM type: Insulin-requiring type 2 Dx'ed: 2003.  Complications: none.   Therapy: insulin since 2017.     GDM: never.   DKA: never.  Severe hypoglycemia: never.  Pancreatitis: never.   Other: she stopped metformin-XR due to nausea and diarrhea; pioglitizone is precluded by edema.  she takes human insulin, due to cost; she declines multiple daily injections.  Interval history: no cbg record, but states cbg's are in the mid-100's.  She takes insulin as rx'ed.  pt states she feels well in general.  No recent steroids.  She takes 90 units qam.  Past Medical History:  Diagnosis Date  . Arthritis   . Carotid artery stenosis    Mild  . Colon polyps   . Diabetes mellitus   . Diverticulitis   . Diverticulosis   . GERD (gastroesophageal reflux disease)   . Grave's disease   . Heart murmur    mitral prolapse  . History of colonic polyps 05/22/2017  . Hyperlipidemia   . Hypertension   . IBS (irritable bowel syndrome)   . Osteopenia   . Thalassemia minor     Past Surgical History:  Procedure Laterality Date  . CHOLECYSTECTOMY    . HIATAL HERNIA REPAIR    . HYSTEROSCOPY     fibroids  . laparoscopy     fibroids    Social History   Socioeconomic History  . Marital status: Married    Spouse name: Not on file  . Number of children: 2  . Years of education: Not on file  . Highest education level: Not on file  Occupational History  . Occupation: Retired  Scientific laboratory technician  . Financial resource strain: Not on file  . Food insecurity:    Worry: Not on file    Inability: Not on file  . Transportation needs:    Medical: Not on file    Non-medical: Not on file  Tobacco Use  . Smoking status: Never Smoker  . Smokeless tobacco: Never Used  Substance and Sexual Activity  . Alcohol use: No    Alcohol/week: 0.0 standard drinks  . Drug use:  No  . Sexual activity: Not on file  Lifestyle  . Physical activity:    Days per week: Not on file    Minutes per session: Not on file  . Stress: Not on file  Relationships  . Social connections:    Talks on phone: Not on file    Gets together: Not on file    Attends religious service: Not on file    Active member of club or organization: Not on file    Attends meetings of clubs or organizations: Not on file    Relationship status: Not on file  . Intimate partner violence:    Fear of current or ex partner: Not on file    Emotionally abused: Not on file    Physically abused: Not on file    Forced sexual activity: Not on file  Other Topics Concern  . Not on file  Social History Narrative   Lives with husband in a one story home.     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   Education: college.    Current Outpatient Medications on File Prior to Visit  Medication Sig  Dispense Refill  . aspirin 81 MG tablet Take 81 mg by mouth daily.      . bisoprolol (ZEBETA) 5 MG tablet Take 1 tablet (5 mg total) by mouth daily. 90 tablet 1  . cholecalciferol (VITAMIN D) 1000 units tablet Take 1,000 Units by mouth daily.    Marland Kitchen escitalopram (LEXAPRO) 20 MG tablet TAKE 1 TABLET BY MOUTH ONCE DAILY 90 tablet 2  . hydrochlorothiazide (HYDRODIURIL) 12.5 MG tablet TAKE 1 TABLET(12.5 MG) BY MOUTH DAILY 90 tablet 3  . Insulin Syringe-Needle U-100 (BD INSULIN SYRINGE U/F) 31G X 5/16" 1 ML MISC USE TO INJECT INSULIN ONCE DAILY 90 each 2  . losartan (COZAAR) 50 MG tablet TAKE 1 TABLET BY MOUTH ONCE DAILY 90 tablet 3  . Multiple Vitamin (MULITIVITAMIN WITH MINERALS) TABS Take 1 tablet by mouth daily.    [name] DELICA LANCETS 29U MISC USE TO CHECK BLOOD SUGAR TWICE DAILY 200 each 0  . ONETOUCH VERIO test strip USE TO CHECK BLOOD SUGAR TWICE DAILY 200 each 0  . pantoprazole (PROTONIX) 40 MG tablet TAKE 1 TABLET BY MOUTH TWICE A DAY 60  tablet 5  . SYNTHROID 175 MCG tablet TAKE 1 TABLET BY MOUTH ONCE DAILY. 90 tablet 0  . vitamin E 400 UNIT capsule Take 400 Units by mouth daily.     No current facility-administered medications on file prior to visit.     Allergies  Allergen Reactions  . Statins Other (See Comments)    Muscle aches    Family History  Adopted: Yes  Problem Relation Age of Onset  . Healthy Son        x 2  . Headache Unknown        Cluster headaches  . Colon cancer Neg Hx   . Pancreatic cancer Neg Hx   . Rectal cancer Neg Hx   . Stomach cancer Neg Hx     BP 132/82 (BP Location: Left Arm, Patient Position: Sitting, Cuff Size: Normal)   Pulse 74   Ht 5' 5"  (1.651 m)   Wt 201 lb 9.6 oz (91.4 kg)   SpO2 95%   BMI 33.55 kg/m    Review of Systems She denies hypoglycemia.      Objective:   Physical Exam VITAL SIGNS:  See vs page GENERAL: no distress Pulses: foot pulses are intact bilaterally.   MSK: no deformity of the feet or ankles.  CV: no edema of the legs or ankles Skin:  no ulcer on the feet or ankles.  normal color and temp on the feet and ankles Neuro: sensation is intact to touch on the feet and ankles.     Lab Results  Component Value Date   HGBA1C 8.4 (A) 12/05/2017      Assessment & Plan:  Insulin-requiring type 2 DM: Worse. Nausea and edema, by history: these limit rx options.   Patient Instructions  Please increase the insulin to 100 units each norming.  After each of the steroid shots, please check to see if the blood sugar goes higher.  If it is over 200, please call us check your blood sugar twice a day.  vary the time of day when you check, between before the 3 meals, and at bedtime.  also check if you have symptoms of your blood sugar being too high or too low.  please keep a record of the readings and bring it to your next appointment here (or you can bring the meter itself).  You can write it  on any piece of paper.  please call us sooner if your blood sugar goes  below 70, or if you have a lot of readings over 200.   Please come back for a follow-up appointment in 2 months.

## 2017-12-09 ENCOUNTER — Ambulatory Visit (HOSPITAL_COMMUNITY): Payer: Medicare Other | Attending: Cardiology

## 2017-12-09 ENCOUNTER — Other Ambulatory Visit: Payer: Self-pay

## 2017-12-09 DIAGNOSIS — I509 Heart failure, unspecified: Secondary | ICD-10-CM | POA: Diagnosis not present

## 2017-12-09 DIAGNOSIS — I6529 Occlusion and stenosis of unspecified carotid artery: Secondary | ICD-10-CM | POA: Diagnosis not present

## 2017-12-09 DIAGNOSIS — E119 Type 2 diabetes mellitus without complications: Secondary | ICD-10-CM | POA: Insufficient documentation

## 2017-12-09 DIAGNOSIS — I517 Cardiomegaly: Secondary | ICD-10-CM | POA: Diagnosis not present

## 2017-12-09 DIAGNOSIS — I110 Hypertensive heart disease with heart failure: Secondary | ICD-10-CM | POA: Insufficient documentation

## 2017-12-09 DIAGNOSIS — I350 Nonrheumatic aortic (valve) stenosis: Secondary | ICD-10-CM | POA: Diagnosis not present

## 2017-12-09 DIAGNOSIS — I447 Left bundle-branch block, unspecified: Secondary | ICD-10-CM | POA: Insufficient documentation

## 2017-12-09 DIAGNOSIS — R0609 Other forms of dyspnea: Secondary | ICD-10-CM | POA: Diagnosis not present

## 2017-12-09 DIAGNOSIS — E785 Hyperlipidemia, unspecified: Secondary | ICD-10-CM | POA: Diagnosis not present

## 2017-12-09 DIAGNOSIS — R0600 Dyspnea, unspecified: Secondary | ICD-10-CM

## 2017-12-09 MED ORDER — PERFLUTREN LIPID MICROSPHERE
1.0000 mL | INTRAVENOUS | Status: AC | PRN
  Administered 2017-12-09: 2 mL via INTRAVENOUS

## 2017-12-10 ENCOUNTER — Telehealth: Payer: Self-pay | Admitting: Interventional Cardiology

## 2017-12-10 NOTE — Telephone Encounter (Signed)
Will route to Dr. Tamala Julian to review both echos and for recommendations.

## 2017-12-10 NOTE — Telephone Encounter (Signed)
Patient states Dr. Melvyn Novas has to cancel her CPX testing due to her Echo results from 12/09/17.  Patient also states Dr. Melvyn Novas would like for Dr. Tamala Julian to compare her previous Echo (12/14/15) with the most recent (12/09/17) and call him with a recommendation.

## 2017-12-11 ENCOUNTER — Encounter (HOSPITAL_COMMUNITY): Payer: PRIVATE HEALTH INSURANCE

## 2017-12-12 ENCOUNTER — Encounter (HOSPITAL_COMMUNITY): Payer: PRIVATE HEALTH INSURANCE

## 2017-12-13 DIAGNOSIS — M47817 Spondylosis without myelopathy or radiculopathy, lumbosacral region: Secondary | ICD-10-CM | POA: Diagnosis not present

## 2017-12-13 DIAGNOSIS — M545 Low back pain: Secondary | ICD-10-CM | POA: Diagnosis not present

## 2017-12-13 NOTE — Telephone Encounter (Signed)
Left message to call back  

## 2017-12-13 NOTE — Telephone Encounter (Signed)
Moderate aortic stenosis. Needs repeat echo yearly. Not yet at point where any intervention needed. Heart strength is normal. I need to see in f/u within next 3 months.

## 2017-12-17 NOTE — Telephone Encounter (Signed)
Spoke with Breanna Webster and went over recommendations.  Scheduled Breanna Webster to see Dr. Tamala Julian 02/19/18.  Breanna Webster appreciative for call.

## 2017-12-26 ENCOUNTER — Telehealth: Payer: Self-pay | Admitting: Internal Medicine

## 2017-12-26 NOTE — Telephone Encounter (Signed)
I changed my mind after her last echo - if her symptoms are getting worse it would be more likely due to her valve and less likely her lung so I would rec she move up her f/u with cards than see me first though always happy to see her if has questions about this or any breathing issues as they may not all be related to her valve issues

## 2017-12-26 NOTE — Telephone Encounter (Signed)
Called and spoke with Patient. Patient last seen 11/11/17 and is concerned that Dr. Melvyn Novas wanted to see her before her 02/19/18 appointment with Dr. Tamala Julian.   Dr. Melvyn Novas please advise

## 2017-12-27 ENCOUNTER — Ambulatory Visit: Payer: PRIVATE HEALTH INSURANCE | Admitting: Gastroenterology

## 2017-12-27 NOTE — Telephone Encounter (Signed)
Attempted to call pt. I did not receive an answer. I have left a message for pt to return our call.  

## 2017-12-30 NOTE — Telephone Encounter (Signed)
Called and spoke to pt. Informed her of the recs per MW. Pt states she will need to call us back as she is in a meeting at this time. Will keep phone note open for pt to call back.

## 2017-12-31 NOTE — Telephone Encounter (Signed)
Called and spoke with Patient.  Patient was concerned that 02/19/18 with Dr. Tamala Julian, was to long to wait for OV.  Instructed MW recommendations and if she felt like she needed to be seen at [facility] she can also call for appointment to be seen.  Patient stated understanding.

## 2018-01-03 ENCOUNTER — Other Ambulatory Visit: Payer: Self-pay | Admitting: Adult Health

## 2018-01-03 NOTE — Telephone Encounter (Signed)
Request denied.  Filled on 07/03/17 for 1 year.  Pt should have refills on file.

## 2018-01-06 ENCOUNTER — Telehealth: Payer: Self-pay | Admitting: Adult Health

## 2018-01-06 MED ORDER — LOSARTAN POTASSIUM 50 MG PO TABS: 50.0000 mg | ORAL_TABLET | Freq: Every day | ORAL | 1 refills | Status: DC

## 2018-01-06 NOTE — Telephone Encounter (Signed)
Copied from [place] [id number]. Topic: Quick Communication - Rx Refill/Question >> Jan 06, 2018  4:17 PM Jarold Motto, Fraser Din wrote: Medication: losartan (COZAAR) 50 MG tablet [[id number]] confirmed that does not have.   Has the patient contacted their pharmacy? yes Preferred Pharmacy (with phone number or street name): [facility] [place], [address] [place] AT [facility] [address] [place] [phone] (Phone) [phone] (Fax)   Agent: Please be advised that RX refills may take up to 3 business days. We ask that you follow-up with your pharmacy.

## 2018-01-07 NOTE — Telephone Encounter (Signed)
Filled on 01/06/2018.

## 2018-01-08 DIAGNOSIS — M461 Sacroiliitis, not elsewhere classified: Secondary | ICD-10-CM | POA: Diagnosis not present

## 2018-01-08 DIAGNOSIS — M47817 Spondylosis without myelopathy or radiculopathy, lumbosacral region: Secondary | ICD-10-CM | POA: Diagnosis not present

## 2018-01-13 ENCOUNTER — Other Ambulatory Visit: Payer: Self-pay | Admitting: Physical Medicine and Rehabilitation

## 2018-01-13 DIAGNOSIS — M25552 Pain in left hip: Principal | ICD-10-CM

## 2018-01-13 DIAGNOSIS — M25551 Pain in right hip: Secondary | ICD-10-CM

## 2018-01-19 ENCOUNTER — Other Ambulatory Visit: Payer: Self-pay | Admitting: Endocrinology

## 2018-01-23 ENCOUNTER — Ambulatory Visit
Admission: RE | Admit: 2018-01-23 | Discharge: 2018-01-23 | Disposition: A | Discharge status: Home / Self Care | Payer: Medicare Other | Source: Ambulatory Visit | Attending: Physical Medicine and Rehabilitation | Admitting: Physical Medicine and Rehabilitation

## 2018-01-23 DIAGNOSIS — Z23 Encounter for immunization: Secondary | ICD-10-CM | POA: Diagnosis not present

## 2018-01-23 DIAGNOSIS — M25552 Pain in left hip: Principal | ICD-10-CM

## 2018-01-23 DIAGNOSIS — M25551 Pain in right hip: Secondary | ICD-10-CM

## 2018-02-04 ENCOUNTER — Encounter: Payer: Self-pay | Admitting: Endocrinology

## 2018-02-04 ENCOUNTER — Ambulatory Visit (INDEPENDENT_AMBULATORY_CARE_PROVIDER_SITE_OTHER): Payer: Medicare Other | Admitting: Endocrinology

## 2018-02-04 VITALS — BP 142/70 | HR 72 | Ht 65.0 in | Wt 203.0 lb

## 2018-02-04 DIAGNOSIS — E118 Type 2 diabetes mellitus with unspecified complications: Secondary | ICD-10-CM | POA: Diagnosis not present

## 2018-02-04 LAB — POCT GLYCOSYLATED HEMOGLOBIN (HGB A1C): Hemoglobin A1C: 9.4 % — AB (ref 4.0–5.6)

## 2018-02-04 MED ORDER — INSULIN NPH (HUMAN) (ISOPHANE) 100 UNIT/ML ~~LOC~~ SUSP: 120.0000 [IU] | SUBCUTANEOUS | 11 refills | Status: DC

## 2018-02-04 NOTE — Progress Notes (Signed)
Subjective:    Patient ID: Breanna Webster, female    DOB: 1945/06/16, 72 y.o.   MRN: [id number]  HPI Pt returns for f/u of diabetes mellitus: DM type: Insulin-requiring type 2 Dx'ed: 2003.  Complications: none.   Therapy: insulin since 2017.     GDM: never.   DKA: never.  Severe hypoglycemia: never.  Pancreatitis: never.   Other: she stopped metformin-XR due to nausea and diarrhea; pioglitizone is precluded by edema.  she takes human insulin, due to cost; she declines multiple daily injections.  Interval history: No recent steroids, but she will receive injections into the lower back tomorrow.  She seldom checks cbg.  It is often over 200.   Past Medical History:  Diagnosis Date  . Arthritis   . Carotid artery stenosis    Mild  . Colon polyps   . Diabetes mellitus   . Diverticulitis   . Diverticulosis   . GERD (gastroesophageal reflux disease)   . Grave's disease   . Heart murmur    mitral prolapse  . History of colonic polyps 05/22/2017  . Hyperlipidemia   . Hypertension   . IBS (irritable bowel syndrome)   . Osteopenia   . Thalassemia minor     Past Surgical History:  Procedure Laterality Date  . CHOLECYSTECTOMY    . HIATAL HERNIA REPAIR    . HYSTEROSCOPY     fibroids  . laparoscopy     fibroids    Social History   Socioeconomic History  . Marital status: Married    Spouse name: Not on file  . Number of children: 2  . Years of education: Not on file  . Highest education level: Not on file  Occupational History  . Occupation: Retired  Scientific laboratory technician  . Financial resource strain: Not on file  . Food insecurity:    Worry: Not on file    Inability: Not on file  . Transportation needs:    Medical: Not on file    Non-medical: Not on file  Tobacco Use  . Smoking status: Never Smoker  . Smokeless tobacco: Never Used  Substance and Sexual Activity  . Alcohol use: No    Alcohol/week: 0.0 standard drinks  . Drug use: No  . Sexual activity: Not on file    Lifestyle  . Physical activity:    Days per week: Not on file    Minutes per session: Not on file  . Stress: Not on file  Relationships  . Social connections:    Talks on phone: Not on file    Gets together: Not on file    Attends religious service: Not on file    Active member of club or organization: Not on file    Attends meetings of clubs or organizations: Not on file    Relationship status: Not on file  . Intimate partner violence:    Fear of current or ex partner: Not on file    Emotionally abused: Not on file    Physically abused: Not on file    Forced sexual activity: Not on file  Other Topics Concern  . Not on file  Social History Narrative   Lives with husband in a one story home.     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   Education: college.    Current Outpatient Medications on File Prior to Visit  Medication Sig Dispense Refill  . aspirin 81 MG tablet  Take 81 mg by mouth daily.      . bisoprolol (ZEBETA) 5 MG tablet Take 1 tablet (5 mg total) by mouth daily. 90 tablet 1  . cholecalciferol (VITAMIN D) 1000 units tablet Take 1,000 Units by mouth daily.    Marland Kitchen escitalopram (LEXAPRO) 20 MG tablet TAKE 1 TABLET BY MOUTH ONCE DAILY 90 tablet 2  . hydrochlorothiazide (HYDRODIURIL) 12.5 MG tablet TAKE 1 TABLET(12.5 MG) BY MOUTH DAILY 90 tablet 3  . Insulin Syringe-Needle U-100 (BD INSULIN SYRINGE U/F) 31G X 5/16" 1 ML MISC USE TO INJECT INSULIN ONCE DAILY 90 each 2  . losartan (COZAAR) 50 MG tablet Take 1 tablet (50 mg total) by mouth daily. 90 tablet 1  . Multiple Vitamin (MULITIVITAMIN WITH MINERALS) TABS Take 1 tablet by mouth daily.    [name] DELICA LANCETS 71I MISC USE TO CHECK BLOOD SUGAR TWICE DAILY 200 each 0  . ONETOUCH VERIO test strip USE TO CHECK BLOOD SUGAR TWICE DAILY 200 each 0  . pantoprazole (PROTONIX) 40 MG tablet TAKE 1 TABLET BY MOUTH TWICE A DAY 60 tablet 5  . SYNTHROID 175 MCG  tablet TAKE 1 TABLET BY MOUTH ONCE DAILY. 90 tablet 0  . vitamin E 400 UNIT capsule Take 400 Units by mouth daily.     No current facility-administered medications on file prior to visit.     Allergies  Allergen Reactions  . Statins Other (See Comments)    Muscle aches    Family History  Adopted: Yes  Problem Relation Age of Onset  . Healthy Son        x 2  . Headache Unknown        Cluster headaches  . Colon cancer Neg Hx   . Pancreatic cancer Neg Hx   . Rectal cancer Neg Hx   . Stomach cancer Neg Hx     BP (!) 142/70 (BP Location: Right Arm, Patient Position: Sitting, Cuff Size: Normal)   Pulse 72   Ht 5' 5"  (1.651 m)   Wt 203 lb (92.1 kg)   SpO2 90%   BMI 33.78 kg/m    Review of Systems She denies hypoglycemia    Objective:   Physical Exam VITAL SIGNS:  See vs page GENERAL: no distress Pulses: foot pulses are intact bilaterally.   MSK: no deformity of the feet or ankles.  CV: no edema of the legs or ankles Skin:  no ulcer on the feet or ankles.  normal color and temp on the feet and ankles Neuro: sensation is intact to touch on the feet and ankles.   Ext: There is bilateral onychomycosis of the toenails   Lab Results  Component Value Date   HGBA1C 9.4 (A) 02/04/2018      Assessment & Plan:  Insulin-requiring type 2 DM: worse Back pain, persistent: steroid injections will continue to affect glycemic control.   Patient Instructions  Please increase the insulin to 120 units each norming.  After each of the steroid shots, please check to see if the blood sugar goes higher.  If it is over 200, please call us check your blood sugar twice a day.  vary the time of day when you check, between before the 3 meals, and at bedtime.  also check if you have symptoms of your blood sugar being too high or too low.  please keep a record of the readings and bring it to your next appointment here (or you can bring the meter itself).  You can write  it on any piece of paper.   please call us sooner if your blood sugar goes below 70, or if you have a lot of readings over 200.   Please come back for a follow-up appointment in 2 months.

## 2018-02-04 NOTE — Patient Instructions (Addendum)
Please increase the insulin to 120 units each norming.  After each of the steroid shots, please check to see if the blood sugar goes higher.  If it is over 200, please call us check your blood sugar twice a day.  vary the time of day when you check, between before the 3 meals, and at bedtime.  also check if you have symptoms of your blood sugar being too high or too low.  please keep a record of the readings and bring it to your next appointment here (or you can bring the meter itself).  You can write it on any piece of paper.  please call us sooner if your blood sugar goes below 70, or if you have a lot of readings over 200.   Please come back for a follow-up appointment in 2 months.

## 2018-02-05 DIAGNOSIS — M7062 Trochanteric bursitis, left hip: Secondary | ICD-10-CM | POA: Diagnosis not present

## 2018-02-05 DIAGNOSIS — M545 Low back pain: Secondary | ICD-10-CM | POA: Diagnosis not present

## 2018-02-11 ENCOUNTER — Ambulatory Visit (INDEPENDENT_AMBULATORY_CARE_PROVIDER_SITE_OTHER): Payer: Medicare Other | Admitting: Gastroenterology

## 2018-02-11 ENCOUNTER — Encounter: Payer: Self-pay | Admitting: Gastroenterology

## 2018-02-11 ENCOUNTER — Encounter

## 2018-02-11 VITALS — BP 110/58 | HR 65 | Ht 60.0 in | Wt 201.2 lb

## 2018-02-11 DIAGNOSIS — K2270 Barrett's esophagus without dysplasia: Secondary | ICD-10-CM | POA: Diagnosis not present

## 2018-02-11 DIAGNOSIS — K219 Gastro-esophageal reflux disease without esophagitis: Secondary | ICD-10-CM | POA: Diagnosis not present

## 2018-02-11 DIAGNOSIS — I350 Nonrheumatic aortic (valve) stenosis: Secondary | ICD-10-CM | POA: Diagnosis not present

## 2018-02-11 DIAGNOSIS — Z86010 Personal history of colonic polyps: Secondary | ICD-10-CM | POA: Diagnosis not present

## 2018-02-11 DIAGNOSIS — R05 Cough: Secondary | ICD-10-CM | POA: Diagnosis not present

## 2018-02-11 DIAGNOSIS — R053 Chronic cough: Secondary | ICD-10-CM

## 2018-02-11 MED ORDER — PANTOPRAZOLE SODIUM 40 MG PO TBEC: 40.0000 mg | DELAYED_RELEASE_TABLET | Freq: Two times a day (BID) | ORAL | 2 refills | Status: DC

## 2018-02-11 NOTE — Progress Notes (Signed)
HPI :  72 year old female here for a follow-up visit. She has a history of reflux, short segment Barrett's esophagus, chronic cough.  She had an upper endoscopy in November 2018. This was remarkable for LA grade B esophagitis, a loose Nissen wrap, and in irregular Z line for which biopsies were obtained and were consistent with Barrett's esophagus. She also had mild gastritis with duodenal erosions. Biopsies were negative for H. Pylori. At that time she also had a colonoscopy in which 28 adenomas were removed, all small. She was referred to genetics and had negative genetic testing for hereditary polyposis syndrome.  She has been on Protonix 40 mg twice daily since her last visit. Previously was on Dexilant however due to cost was transitioned to Protonix. She states for the most part she feels this controls her typical reflux symptoms of pyrosis and regurgitation fairly well. She does continue to have episodic coughing spells which bother her significantly. She has been evaluated by pulmonary for this issue. Unclear what is driving it. She endorses some cough when drinking liquids, we obtained a modified barium swallow and it showed no evidence of aspiration in July. Her swallowing function was deemed to be normal. She also endorses dyspnea with exertion. She had an echocardiogram done in August which showed an ejection fraction of 65-70%. It also noted that she had aortic stenosis which appeared to be worsening since her last exam. She was referred to cardiology and is seeing Dr. Daneen Schick for this issue next week.  She has a lot of questions about her Barrett's esophagus today, risks for esophageal cancer. She questions whether her cough is related to her reflux or not. She is frustrated by her chronic cough. She has a upper respiratory infection on top of her chronic cough for the past week or 2 and her coughing is much worse than usual. She is using Robitussin as needed which helps. Her bowel  habits are stable at this time. She questions frequency of her colonoscopy and wife she had so many polyps on her last colonoscopy. She has questions about long-term risks and benefits of chronic PPI use and if she needs to continue to take it.  DEXA 03/2017 - stable osteopenia  Endoscopic history: EGD 03/20/2017 - irregular z line, LA grade B esophagitis, small paraesophageal hernia, loose Nissen wrap, mild gastritis, duodenal erosions - path c/w Barrett's, H pylori negative  Colonoscopy 03/20/2017 - pancolonic diverticulosis, 28 polyps removed -  EGD 2002 - suspected segment of BE, no BE on biopsies EGD 2006 - esophagitis and gastritis, no evidence of BE Colonoscopy 5/05 - diverticulosis Colonoscopy 4/15 - 4 small adenomas  Past Medical History:  Diagnosis Date  . Arthritis   . Carotid artery stenosis    Mild  . Colon polyps   . Diabetes mellitus   . Diverticulitis   . Diverticulosis   . GERD (gastroesophageal reflux disease)   . Grave's disease   . Heart murmur    mitral prolapse  . History of colonic polyps 05/22/2017  . Hyperlipidemia   . Hypertension   . IBS (irritable bowel syndrome)   . Osteopenia   . Thalassemia minor      Past Surgical History:  Procedure Laterality Date  . CHOLECYSTECTOMY    . HIATAL HERNIA REPAIR    . HYSTEROSCOPY     fibroids  . laparoscopy     fibroids   Family History  Adopted: Yes  Problem Relation Age of Onset  . Healthy Son  x 2  . Headache Unknown        Cluster headaches  . Colon cancer Neg Hx   . Pancreatic cancer Neg Hx   . Rectal cancer Neg Hx   . Stomach cancer Neg Hx    Social History   Tobacco Use  . Smoking status: Never Smoker  . Smokeless tobacco: Never Used  Substance Use Topics  . Alcohol use: No    Alcohol/week: 0.0 standard drinks  . Drug use: No   Current Outpatient Medications  Medication Sig Dispense Refill  . aspirin 81 MG tablet Take 81 mg by mouth daily.      . bisoprolol (ZEBETA) 5 MG  tablet Take 1 tablet (5 mg total) by mouth daily. 90 tablet 1  . cholecalciferol (VITAMIN D) 1000 units tablet Take 1,000 Units by mouth daily.    Marland Kitchen escitalopram (LEXAPRO) 20 MG tablet TAKE 1 TABLET BY MOUTH ONCE DAILY 90 tablet 2  . hydrochlorothiazide (HYDRODIURIL) 12.5 MG tablet TAKE 1 TABLET(12.5 MG) BY MOUTH DAILY 90 tablet 3  . insulin NPH Human (NOVOLIN N RELION) 100 UNIT/ML injection Inject 1.2 mLs (120 Units total) into the skin every morning. And syringes 1/day 40 mL 11  . Insulin Syringe-Needle U-100 (BD INSULIN SYRINGE U/F) 31G X 5/16" 1 ML MISC USE TO INJECT INSULIN ONCE DAILY 90 each 2  . losartan (COZAAR) 50 MG tablet Take 1 tablet (50 mg total) by mouth daily. 90 tablet 1  . Multiple Vitamin (MULITIVITAMIN WITH MINERALS) TABS Take 1 tablet by mouth daily.    [name] DELICA LANCETS 24O MISC USE TO CHECK BLOOD SUGAR TWICE DAILY 200 each 0  . ONETOUCH VERIO test strip USE TO CHECK BLOOD SUGAR TWICE DAILY 200 each 0  . pantoprazole (PROTONIX) 40 MG tablet TAKE 1 TABLET BY MOUTH TWICE A DAY 60 tablet 5  . SYNTHROID 175 MCG tablet TAKE 1 TABLET BY MOUTH ONCE DAILY. 90 tablet 0  . vitamin E 400 UNIT capsule Take 400 Units by mouth daily.     No current facility-administered medications for this visit.    Allergies  Allergen Reactions  . Statins Other (See Comments)    Muscle aches     Review of Systems: All systems reviewed and negative except where noted in HPI.   Lab Results  Component Value Date   WBC 8.2 06/14/2017   HGB 11.9 (L) 06/14/2017   HCT 37.3 06/14/2017   MCV 61.8 Repeated and verified X2. (L) 06/14/2017   PLT 261.0 06/14/2017    Lab Results  Component Value Date   IRON 137 05/29/2016   FERRITIN 106.1 05/29/2016    Lab Results  Component Value Date   ALT 32 06/14/2017   AST 26 06/14/2017   ALKPHOS 73 06/14/2017   BILITOT 0.7 06/14/2017    Lab Results  Component Value Date   CREATININE 0.80 06/14/2017   BUN 26 (H) 06/14/2017   NA 137  06/14/2017   K 4.1 06/14/2017   CL 100 06/14/2017   CO2 28 06/14/2017      Physical Exam: BP (!) 110/58   Pulse 65   Ht 5' (1.524 m)   Wt 201 lb 4 oz (91.3 kg)   BMI 39.30 kg/m  Constitutional: Pleasant,well-developed, female in no acute distress. HEENT: Normocephalic and atraumatic. Conjunctivae are normal. No scleral icterus. Neck supple.  Cardiovascular: Normal rate, regular rhythm.  Pulmonary/chest: Effort normal and breath sounds normal. Some scattered coarse BS. Abdominal: Soft, nondistended, nontender.  There are  no masses palpable. No hepatomegaly. Extremities: no edema Lymphadenopathy: No cervical adenopathy noted. Neurological: Alert and oriented to person place and time. Skin: Skin is warm and dry. No rashes noted. Psychiatric: Normal mood and affect. Behavior is normal.   ASSESSMENT AND PLAN: 72 year old female here for reassessment of the following issues:  GERD / Barrett's esophagus / chronic cough - she clearly has reflux, she has a history of a Nissen with loosening of the wrap on last endoscopy. She also had a short segment of Barrett's esophagus. I discussed with her the increased risk for esophagus cancer in relation to Barrett's esophagus, however the risk is thought to be extremely low especially with a very short segment that she has. To minimize the risk for progression of Barrett's, chronic PPI therapy is recommended. I discussed the long-term associated risks with chronic PPI use with her, we discussed this at length. I think given her symptoms of reflux and history of Barrett's the benefits of PPI therapy outweigh the risks. She does have osteopenia and need to be mindful for her fracture risk. In regards to her cough, it is possible that reflux is related to this, however this cough may be multifactorial per pulmonary. Her other more typical reflux symptoms seem fairly well controlled on protonix twice daily. To clarify how well her reflux is controlled and  to see if it is related to her cough, I offered her a pH impedance test on high-dose PPI which would be the best way to objectively assess for this. If the reflux is well controlled on this regimen, we will continue it. If she has poorly controlled reflux with good correlation with her cough, she may wish to seek a surgical opinion regarding a redo of her Nissen. Following discussion of pH impedance / manometry testing, she was agreeable to proceed with it. This will be done on Protonix 40 mg twice daily. Further recommendations made regarding her management pending this result. She agreed the plan  History of colon adenomas / aortic stenosis - numerous adenomas on last colonoscopy. Genetic testing done and negative. A colonoscopy is recommended next month for surveillance. I recommend she see her cardiologist first for her aortic stenosis evaluation, which is scheduled for next week. If her aortic valve warrants intervention, that should be done prior to her colonoscopy. We will touch base with them in the upcoming weeks to see how that visit goes. If she is cleared by cardiology to proceed with colonoscopy, we'll schedule in the next 1-2 months. She agreed with the plan  Claypool Cellar, MD [facility]

## 2018-02-11 NOTE — Patient Instructions (Addendum)
If you are age 74 or older, your body mass index should be between 23-30. Your Body mass index is 39.3 kg/m. If this is out of the aforementioned range listed, please consider follow up with your Primary Care Provider.  If you are age 14 or younger, your body mass index should be between 19-25. Your Body mass index is 39.3 kg/m. If this is out of the aformentioned range listed, please consider follow up with your Primary Care Provider.   You have been scheduled for an Esophageal Manometry and 24 Hour pH study at [facility] on Monday, 03-03-18 at 12:30pm am. Please arrive 30 minutes prior to your procedure for registration. You will need to go to outpatient registration (1st floor of the hospital) first. Make certain to bring your insurance cards as well as a complete list of medications.  Please remember the following:  1) Do not take any muscle relaxants, xanax (alprazolam) or ativan for 1 day prior to your test as well as the day of the test.  2) Nothing to eat or drink after 12:00 midnight on the night before your test.  3) Hold all diabetic medications/insulin the morning of the test. You may eat and take your medications after the test.  4) For 7 days prior to your test, do not take: Reglan, Tagamet, Zantac, Phenergan, Axid or Pepcid.  5) You MAY use an antacid such as Rolaids or Tums up to 12 hours prior to your test.  6) Continue taking Protonix    ------------------------------------------------------------------------------------------- ABOUT ESOPHAGEAL MANOMETRY Esophageal manometry (muh-NOM-uh-tree) is a test that gauges how well your esophagus works. Your esophagus is the long, muscular tube that connects your throat to your stomach. Esophageal manometry measures the rhythmic muscle contractions (peristalsis) that occur in your esophagus when you swallow. Esophageal manometry also measures the coordination and force exerted by the muscles of your esophagus.   During esophageal manometry, a thin, flexible tube (catheter) that contains sensors is passed through your nose, down your esophagus and into your stomach. Esophageal manometry can be helpful in diagnosing some mostly uncommon disorders that affect your esophagus.  Why it's done Esophageal manometry is used to evaluate the movement (motility) of food through the esophagus and into the stomach. The test measures how well the circular bands of muscle (sphincters) at the top and bottom of your esophagus open and close, as well as the pressure, strength and pattern of the wave of esophageal muscle contractions that moves food along.  What you can expect Esophageal manometry is an outpatient procedure done without sedation. Most people tolerate it well. You may be asked to change into a hospital gown before the test starts.  During esophageal manometry  . While you are sitting up, a member of your health care team sprays your throat with a numbing medication or puts numbing gel in your nose or both.  . A catheter is guided through your nose into your esophagus. The catheter may be sheathed in a water-filled sleeve. It doesn't interfere with your breathing. However, your eyes may water, and you may gag. You may have a slight nosebleed from irritation.  . After the catheter is in place, you may be asked to lie on your back on an exam table, or you may be asked to remain seated.  . You then swallow small sips of water. As you do, a computer connected to the catheter records the pressure, strength and pattern of your esophageal muscle contractions.  . During the  test, you'll be asked to breathe slowly and smoothly, remain as still as possible, and swallow only when you're asked to do so.  . A member of your health care team may move the catheter down into your stomach while the catheter continues its measurements.  . The catheter then is slowly withdrawn.  This test typically takes 30-45 minutes to  complete.  ---------------------------------------------------------------------------------------------- ABOUT 24 HOUR PH PROBE An esophageal pH test measures and records the pH in your esophagus to determine if you have gastroesophageal reflux disease (GERD). The test can also be done to determine the effectiveness of medications or surgical treatment for GERD. What is esophageal reflux? Esophageal reflux is a condition in which stomach acid refluxes or moves back into the esophagus (the "food pipe" leading from the mouth to the stomach). How does the esophageal pH test work? A thin, small tube with an acid sensing device on the tip is gently passed through your nose, down the esophagus ("food tube"), and positioned about 2 inches above the lower esophageal sphincter. The tube is secured to the side of your face with clear tape. The end of the tube exiting from your nose is attached to a portable recorder that is worn on your belt or over your shoulder. The recorder has several buttons on it that you will press to mark certain events. A nurse will review the monitoring instructions with you. Once the test has begun, what do I need to know and do? Marland Kitchen Activity: Follow your usual daily routine. Do not reduce or change your activities during the monitoring period. Doing so can make the monitoring results less useful.  . Note: do not take a tub bath or shower; the equipment can't get wet.  . Eating: Eat your regular meals at the usual times. If you do not eat during the monitoring period, your stomach will not produce acid as usual, and the test results will not be accurate. Eat at least 2 meals a day. Eat foods that tend to increase your symptoms (without making yourself miserable). Avoid snacking. Do not suck on hard candy or lozenges and do not chew gum during the monitoring period.  . Lying down: Remain upright throughout the day. Do not lie down until you go to bed (unless napping or lying down during  the day is part of your daily routine).  . Medications: Continue to follow your doctor's advice regarding medications to avoid during the monitoring period.  . Recording symptoms: Press the appropriate button on your recorder when symptoms occur (as discussed with the nurse).  . Recording events: Record the time you start and stop eating and drinking (anything other than plain water). Record the time you lie down (even if just resting) and when you get back up. The nurse will explain this.  . Unusual symptoms or side effects. If you think you may be experiencing any unusual symptoms or side effects, call your doctor.  You will return the next day to have the tube removed. The information on the recorder will be downloaded to a computer and the results will be analyzed.  After completion of the study Resume your normal diet and medications. Lozenges or hard candy may help ease any sore throat caused by the tube.   It will take at least 2 weeks to receive the results of this test from your physician.   We will consider scheduling you for a colonoscopy after you have your appointment with Cardiology and are cleared due to your  aortic stenosis.  Thank you for entrusting me with your care and for choosing [facility], Dr.  Cellar

## 2018-02-18 NOTE — H&P (View-Only) (Signed)
Cardiology Office Note:    Date:  02/19/2018   ID:  Breanna Webster, DOB 08-18-1945, MRN [id number]  PCP:  [name], NP  Cardiologist:  Sinclair Grooms, MD   Referring MD: [name], NP   Chief Complaint  Patient presents with  . Cardiac Valve Problem    Aortic stenosis    History of Present Illness:    Breanna Webster is a 72 y.o. female with a hx of dyspnea on exertion, aortic stenosis, left bundle branch block, diabetes, hyperlipidemia, and gastroesophageal reflux.  The patient has calcific aortic stenosis.  She has developed progressive exertional dyspnea over the past 2 years.  The dyspnea now is lifestyle altering.  She has no orthopnea, chest pain, and has not had syncope.  She has been seeing pulmonology and gastroenterology.  Dr. worked, has been trying to determine if there is a pulmonary component to her shortness of breath.  A recent echocardiogram demonstrated that she now has moderate aortic stenosis with a transvalvular velocity of 3.6 m/s.  Severe is when velocity is greater than 4 m/s.  LV function and RV function are normal.  She has a calcified mitral annulus.  From gastroenterology standpoint she needs colonoscopy.  She states that GI has refused to do procedures because they are concerned about whether her valve problem poses a risk with anesthesia.  Past Medical History:  Diagnosis Date  . Arthritis   . Carotid artery stenosis    Mild  . Colon polyps   . Diabetes mellitus   . Diverticulitis   . Diverticulosis   . GERD (gastroesophageal reflux disease)   . Grave's disease   . Heart murmur    mitral prolapse  . History of colonic polyps 05/22/2017  . Hyperlipidemia   . Hypertension   . IBS (irritable bowel syndrome)   . Osteopenia   . Thalassemia minor     Past Surgical History:  Procedure Laterality Date  . CHOLECYSTECTOMY    . HIATAL HERNIA REPAIR    . HYSTEROSCOPY     fibroids  . laparoscopy     fibroids    Current  Medications: Current Meds  Medication Sig  . aspirin 81 MG tablet Take 81 mg by mouth daily.    . bisoprolol (ZEBETA) 5 MG tablet Take 1 tablet (5 mg total) by mouth daily.  . cholecalciferol (VITAMIN D) 1000 units tablet Take 1,000 Units by mouth daily.  Marland Kitchen escitalopram (LEXAPRO) 20 MG tablet TAKE 1 TABLET BY MOUTH ONCE DAILY  . hydrochlorothiazide (HYDRODIURIL) 12.5 MG tablet TAKE 1 TABLET(12.5 MG) BY MOUTH DAILY  . insulin NPH Human (NOVOLIN N RELION) 100 UNIT/ML injection Inject 1.2 mLs (120 Units total) into the skin every morning. And syringes 1/day  . Insulin Syringe-Needle U-100 (BD INSULIN SYRINGE U/F) 31G X 5/16" 1 ML MISC USE TO INJECT INSULIN ONCE DAILY  . losartan (COZAAR) 50 MG tablet Take 1 tablet (50 mg total) by mouth daily.  . Multiple Vitamin (MULITIVITAMIN WITH MINERALS) TABS Take 1 tablet by mouth daily.  Breanna Webster DELICA LANCETS 93X MISC USE TO CHECK BLOOD SUGAR TWICE DAILY  . ONETOUCH VERIO test strip USE TO CHECK BLOOD SUGAR TWICE DAILY  . pantoprazole (PROTONIX) 40 MG tablet Take 1 tablet (40 mg total) by mouth 2 (two) times daily.  Marland Kitchen SYNTHROID 175 MCG tablet TAKE 1 TABLET BY MOUTH ONCE DAILY.  . vitamin E 400 UNIT capsule Take 400 Units by mouth daily.     Allergies:  Statins   Social History   Socioeconomic History  . Marital status: Married    Spouse name: Not on file  . Number of children: 2  . Years of education: Not on file  . Highest education level: Not on file  Occupational History  . Occupation: Retired  Scientific laboratory technician  . Financial resource strain: Not on file  . Food insecurity:    Worry: Not on file    Inability: Not on file  . Transportation needs:    Medical: Not on file    Non-medical: Not on file  Tobacco Use  . Smoking status: Never Smoker  . Smokeless tobacco: Never Used  Substance and Sexual Activity  . Alcohol use: No    Alcohol/week: 0.0 standard drinks  . Drug use: No  . Sexual activity: Not on file  Lifestyle  . Physical  activity:    Days per week: Not on file    Minutes per session: Not on file  . Stress: Not on file  Relationships  . Social connections:    Talks on phone: Not on file    Gets together: Not on file    Attends religious service: Not on file    Active member of club or organization: Not on file    Attends meetings of clubs or organizations: Not on file    Relationship status: Not on file  Other Topics Concern  . Not on file  Social History Narrative   Lives with husband in a one story home.     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   Education: college.     Family History: The patient's family history includes Headache in her unknown relative; Healthy in her son. There is no history of Colon cancer, Pancreatic cancer, Rectal cancer, or Stomach cancer. She was adopted.  ROS:   Please see the history of present illness.    Wheezing, cough, reflux, hoarseness, colonic polyps, osteoarthritis of the lower spine and left hip.  Multiple medical problems are conspiring to prevent free activity.  All other systems reviewed and are negative.  EKGs/Labs/Other Studies Reviewed:    The following studies were reviewed today:  2D Doppler echocardiogram December 09, 2017:  Study Conclusions  - Procedure narrative: Transthoracic echocardiography. Image   quality was suboptimal. The study was technically difficult, as a   result of poor acoustic windows, poor sound wave transmission,   and body habitus. Intravenous contrast (Definity) was   administered. - Left ventricle: The cavity size was normal. Systolic function was   vigorous. The estimated ejection fraction was in the range of 65%   to 70%. Wall motion was normal; there were no regional wall   motion abnormalities. Doppler parameters are consistent with   abnormal left ventricular relaxation (grade 1 diastolic   dysfunction). - Aortic valve: Trileaflet; moderately  thickened, moderately   calcified leaflets. Valve mobility was restricted. There was   moderate stenosis. Peak velocity (S): 346 cm/s. Mean gradient   (S): 26 mm Hg. Peak gradient (S): 48 mm Hg. Valve area (VTI):   1.19 cm^2. - Mitral valve: Severely calcified annulus. Valve area by pressure   half-time: 2.44 cm^2. - Left atrium: The atrium was mildly dilated. Volume/bsa, ES,   (1-plane Simpson&'s, A2C): 41.3 ml/m^2.  Impressions:  - Aortic stenosis has advanced when compared to prior.   EKG:  EKG is  ordered today.  The ekg ordered today demonstrates sinus  rhythm with left bundle branch block, QRS duration 168 ms.  When compared to the prior tracing performed 11/21/2016, no significant change other than current heart rate is slower.  Recent Labs: 06/14/2017: ALT 32; BUN 26; Creatinine, Ser 0.80; Hemoglobin 11.9; Platelets 261.0; Potassium 4.1; Sodium 137 08/05/2017: TSH 0.79  Recent Lipid Panel    Component Value Date/Time   CHOL 159 06/14/2017 0826   CHOL 156 11/21/2016 1113   TRIG 123.0 06/14/2017 0826   HDL 41.20 06/14/2017 0826   HDL 44 11/21/2016 1113   CHOLHDL 4 06/14/2017 0826   VLDL 24.6 06/14/2017 0826   LDLCALC 93 06/14/2017 0826   LDLCALC 91 11/21/2016 1113   LDLDIRECT 98.0 05/24/2014 1343    Physical Exam:    VS:  BP 122/64   Pulse 61   Ht 5' 5"  (1.651 m)   Wt 203 lb 1.9 oz (92.1 kg)   BMI 33.80 kg/m     Wt Readings from Last 3 Encounters:  02/19/18 203 lb 1.9 oz (92.1 kg)  02/11/18 201 lb 4 oz (91.3 kg)  02/04/18 203 lb (92.1 kg)     GEN:  Well nourished, well developed in no acute distress HEENT: Normal NECK: No JVD. LYMPHATICS: No lymphadenopathy CARDIAC: RRR, coarse 3/6 to 4/6 crescendo decrescendo systolic aortic valve murmur, no gallop, no edema. VASCULAR: 2+ bilateral radial and carotid pulses.  No bruits. RESPIRATORY:  Clear to auscultation without rales, wheezing or rhonchi  ABDOMEN: Soft, non-tender, non-distended, No pulsatile  mass, MUSCULOSKELETAL: No deformity  SKIN: Warm and dry NEUROLOGIC:  Alert and oriented x 3 PSYCHIATRIC:  Normal affect   ASSESSMENT:    1. Aortic stenosis, mild   2. Chronic diastolic congestive heart failure ([place])   3. Essential hypertension, benign   4. Dyspnea on exertion   5. Left bundle branch block   6. Type 2 diabetes mellitus without complication, without long-term current use of insulin (HCC)   7. Essential hypertension    PLAN:    In order of problems listed above:  1.  New York Heart Association functional class III-IV symptoms.  The symptoms or discordant with the severity of her aortic stenosis.  She is reluctant to do any exercise testing because of anxiety.  We will plan to do a left and right heart cath with coronary angiogram to evaluate left ventricular filling pressures.  We will also obtain a BNP.  Coronary angiography will be done at the same time.  This will allow [name] to determine by another means if there are significant cardiovascular risk factors that would make proceeding with her noncardiac work-up passages. 1. Right heart cath will help to determine if there is significant diastolic heart failure with elevated pulmonary capillary wedge pressure. 2. Blood pressures excellently controlled. 3. I suspect dyspnea is multifactorial: Physical deconditioning, potentially some underlying pulmonary process, and likely some heart failure. 4. Would need to be careful not to induce complete heart block during right heart cath. 5. Hemoglobin A1c less than 7 is our target.  The patient was counseled to undergo left heart catheterization, coronary angiography, and possible percutaneous coronary intervention with stent implantation. The procedural risks and benefits were discussed in detail. The risks discussed included death, stroke, myocardial infarction, life-threatening bleeding, limb ischemia, kidney injury, allergy, and possible emergency cardiac surgery. The risk of these  significant complications were estimated to occur less than 1% of the time. After discussion, the patient has agreed to proceed.  Greater than 50% of the time during this office  visit was spent in education, counseling, and coordination of care related to underlying disease process and testing as outlined.  Natural history of aortic stenosis discussed in detail and multiple questions by both husband and wife required attention.    Medication Adjustments/Labs and Tests Ordered: Current medicines are reviewed at length with the patient today.  Concerns regarding medicines are outlined above.  Orders Placed This Encounter  Procedures  . Basic Metabolic Panel (BMET)  . CBC  . Pro b natriuretic peptide  . EKG 12-Lead   No orders of the defined types were placed in this encounter.   Patient Instructions  Medication Instructions:  Your physician recommends that you continue on your current medications as directed. Please refer to the Current Medication list given to you today.  If you need a refill on your cardiac medications before your next appointment, please call your pharmacy.   Lab work: Lab work to be done today--BMP, CBC and BNP If you have labs (blood work) drawn today and your tests are completely normal, you will receive your results only by: Marland Kitchen MyChart Message (if you have MyChart) OR . A paper copy in the mail If you have any lab test that is abnormal or we need to change your treatment, we will call you to review the results.  Testing/Procedures: Your physician has requested that you have a cardiac catheterization. Cardiac catheterization is used to diagnose and/or treat various heart conditions. Doctors may recommend this procedure for a number of different reasons. The most common reason is to evaluate chest pain. Chest pain can be a symptom of coronary artery disease (CAD), and cardiac catheterization can show whether plaque is narrowing or blocking your heart's arteries.  This procedure is also used to evaluate the valves, as well as measure the blood flow and oxygen levels in different parts of your heart. For further information please visit [email or web address]. Please follow instruction sheet, as given.  Scheduled for October 31,2019  Follow-Up: To be arranged after catheterization.     [facility] [place], [place] [address] Dept: [phone] Loc: [place]  02/19/2018  You are scheduled for a Cardiac Catheterization on Thursday, October 31 with Dr. Daneen Schick.  1. Please arrive at the [facility] (Main Entrance A) at [facility]: [address] [place], [place] [address] at 10:00 AM (This time is two hours before your procedure to ensure your preparation). Free valet parking service is available.   Special note: Every effort is made to have your procedure done on time. Please understand that emergencies sometimes delay scheduled procedures.  2. Diet: Do not eat solid foods after midnight.  The patient may have clear liquids until 5am upon the day of the procedure.  3. Labs: Lab work was done in office on October 30,2019.  4. Medication instructions in preparation for your procedure:   Contrast Allergy: No  Do not take hydrochlorothiazide the morning of the procedure. Do not take insulin or oral diabetes medication the morning of the procedure.     On the morning of your procedure, take your Aspirin and any morning medicines NOT listed above.  You may use sips of water.  5. Plan for one night stay--bring personal belongings. 6. Bring a current list of your medications and current insurance cards. 7. You MUST have a responsible person to drive you home. 8. Someone MUST be with you the first  24 hours after you arrive home or your discharge will be delayed. 9. Please wear clothes that are easy to get on and  off and wear slip-on shoes.  Thank you for allowing [name] to care for you!   -- [facility]      Signed, Sinclair Grooms, MD  02/19/2018 12:24 PM    [place]

## 2018-02-18 NOTE — Progress Notes (Addendum)
Cardiology Office Note:    Date:  02/19/2018   ID:  Breanna Webster, DOB Dec 24, 1945, MRN [id number]  PCP:  [name], NP  Cardiologist:  Sinclair Grooms, MD   Referring MD: [name], NP   Chief Complaint  Patient presents with  . Cardiac Valve Problem    Aortic stenosis    History of Present Illness:    Breanna Webster is a 72 y.o. female with a hx of dyspnea on exertion, aortic stenosis, left bundle branch block, diabetes, hyperlipidemia, and gastroesophageal reflux.  The patient has calcific aortic stenosis.  She has developed progressive exertional dyspnea over the past 2 years.  The dyspnea now is lifestyle altering.  She has no orthopnea, chest pain, and has not had syncope.  She has been seeing pulmonology and gastroenterology.  Dr. worked, has been trying to determine if there is a pulmonary component to her shortness of breath.  A recent echocardiogram demonstrated that she now has moderate aortic stenosis with a transvalvular velocity of 3.6 m/s.  Severe is when velocity is greater than 4 m/s.  LV function and RV function are normal.  She has a calcified mitral annulus.  From gastroenterology standpoint she needs colonoscopy.  She states that GI has refused to do procedures because they are concerned about whether her valve problem poses a risk with anesthesia.  Past Medical History:  Diagnosis Date  . Arthritis   . Carotid artery stenosis    Mild  . Colon polyps   . Diabetes mellitus   . Diverticulitis   . Diverticulosis   . GERD (gastroesophageal reflux disease)   . Grave's disease   . Heart murmur    mitral prolapse  . History of colonic polyps 05/22/2017  . Hyperlipidemia   . Hypertension   . IBS (irritable bowel syndrome)   . Osteopenia   . Thalassemia minor     Past Surgical History:  Procedure Laterality Date  . CHOLECYSTECTOMY    . HIATAL HERNIA REPAIR    . HYSTEROSCOPY     fibroids  . laparoscopy     fibroids    Current  Medications: Current Meds  Medication Sig  . aspirin 81 MG tablet Take 81 mg by mouth daily.    . bisoprolol (ZEBETA) 5 MG tablet Take 1 tablet (5 mg total) by mouth daily.  . cholecalciferol (VITAMIN D) 1000 units tablet Take 1,000 Units by mouth daily.  Marland Kitchen escitalopram (LEXAPRO) 20 MG tablet TAKE 1 TABLET BY MOUTH ONCE DAILY  . hydrochlorothiazide (HYDRODIURIL) 12.5 MG tablet TAKE 1 TABLET(12.5 MG) BY MOUTH DAILY  . insulin NPH Human (NOVOLIN N RELION) 100 UNIT/ML injection Inject 1.2 mLs (120 Units total) into the skin every morning. And syringes 1/day  . Insulin Syringe-Needle U-100 (BD INSULIN SYRINGE U/F) 31G X 5/16" 1 ML MISC USE TO INJECT INSULIN ONCE DAILY  . losartan (COZAAR) 50 MG tablet Take 1 tablet (50 mg total) by mouth daily.  . Multiple Vitamin (MULITIVITAMIN WITH MINERALS) TABS Take 1 tablet by mouth daily.  Breanna Webster DELICA LANCETS 15A MISC USE TO CHECK BLOOD SUGAR TWICE DAILY  . ONETOUCH VERIO test strip USE TO CHECK BLOOD SUGAR TWICE DAILY  . pantoprazole (PROTONIX) 40 MG tablet Take 1 tablet (40 mg total) by mouth 2 (two) times daily.  Marland Kitchen SYNTHROID 175 MCG tablet TAKE 1 TABLET BY MOUTH ONCE DAILY.  . vitamin E 400 UNIT capsule Take 400 Units by mouth daily.     Allergies:  Statins   Social History   Socioeconomic History  . Marital status: Married    Spouse name: Not on file  . Number of children: 2  . Years of education: Not on file  . Highest education level: Not on file  Occupational History  . Occupation: Retired  Scientific laboratory technician  . Financial resource strain: Not on file  . Food insecurity:    Worry: Not on file    Inability: Not on file  . Transportation needs:    Medical: Not on file    Non-medical: Not on file  Tobacco Use  . Smoking status: Never Smoker  . Smokeless tobacco: Never Used  Substance and Sexual Activity  . Alcohol use: No    Alcohol/week: 0.0 standard drinks  . Drug use: No  . Sexual activity: Not on file  Lifestyle  . Physical  activity:    Days per week: Not on file    Minutes per session: Not on file  . Stress: Not on file  Relationships  . Social connections:    Talks on phone: Not on file    Gets together: Not on file    Attends religious service: Not on file    Active member of club or organization: Not on file    Attends meetings of clubs or organizations: Not on file    Relationship status: Not on file  Other Topics Concern  . Not on file  Social History Narrative   Lives with husband in a one story home.     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   Education: college.     Family History: The patient's family history includes Headache in her unknown relative; Healthy in her son. There is no history of Colon cancer, Pancreatic cancer, Rectal cancer, or Stomach cancer. She was adopted.  ROS:   Please see the history of present illness.    Wheezing, cough, reflux, hoarseness, colonic polyps, osteoarthritis of the lower spine and left hip.  Multiple medical problems are conspiring to prevent free activity.  All other systems reviewed and are negative.  EKGs/Labs/Other Studies Reviewed:    The following studies were reviewed today:  2D Doppler echocardiogram December 09, 2017:  Study Conclusions  - Procedure narrative: Transthoracic echocardiography. Image   quality was suboptimal. The study was technically difficult, as a   result of poor acoustic windows, poor sound wave transmission,   and body habitus. Intravenous contrast (Definity) was   administered. - Left ventricle: The cavity size was normal. Systolic function was   vigorous. The estimated ejection fraction was in the range of 65%   to 70%. Wall motion was normal; there were no regional wall   motion abnormalities. Doppler parameters are consistent with   abnormal left ventricular relaxation (grade 1 diastolic   dysfunction). - Aortic valve: Trileaflet; moderately  thickened, moderately   calcified leaflets. Valve mobility was restricted. There was   moderate stenosis. Peak velocity (S): 346 cm/s. Mean gradient   (S): 26 mm Hg. Peak gradient (S): 48 mm Hg. Valve area (VTI):   1.19 cm^2. - Mitral valve: Severely calcified annulus. Valve area by pressure   half-time: 2.44 cm^2. - Left atrium: The atrium was mildly dilated. Volume/bsa, ES,   (1-plane Simpson&'s, A2C): 41.3 ml/m^2.  Impressions:  - Aortic stenosis has advanced when compared to prior.   EKG:  EKG is  ordered today.  The ekg ordered today demonstrates sinus  rhythm with left bundle branch block, QRS duration 168 ms.  When compared to the prior tracing performed 11/21/2016, no significant change other than current heart rate is slower.  Recent Labs: 06/14/2017: ALT 32; BUN 26; Creatinine, Ser 0.80; Hemoglobin 11.9; Platelets 261.0; Potassium 4.1; Sodium 137 08/05/2017: TSH 0.79  Recent Lipid Panel    Component Value Date/Time   CHOL 159 06/14/2017 0826   CHOL 156 11/21/2016 1113   TRIG 123.0 06/14/2017 0826   HDL 41.20 06/14/2017 0826   HDL 44 11/21/2016 1113   CHOLHDL 4 06/14/2017 0826   VLDL 24.6 06/14/2017 0826   LDLCALC 93 06/14/2017 0826   LDLCALC 91 11/21/2016 1113   LDLDIRECT 98.0 05/24/2014 1343    Physical Exam:    VS:  BP 122/64   Pulse 61   Ht 5' 5"  (1.651 m)   Wt 203 lb 1.9 oz (92.1 kg)   BMI 33.80 kg/m     Wt Readings from Last 3 Encounters:  02/19/18 203 lb 1.9 oz (92.1 kg)  02/11/18 201 lb 4 oz (91.3 kg)  02/04/18 203 lb (92.1 kg)     GEN:  Well nourished, well developed in no acute distress HEENT: Normal NECK: No JVD. LYMPHATICS: No lymphadenopathy CARDIAC: RRR, coarse 3/6 to 4/6 crescendo decrescendo systolic aortic valve murmur, no gallop, no edema. VASCULAR: 2+ bilateral radial and carotid pulses.  No bruits. RESPIRATORY:  Clear to auscultation without rales, wheezing or rhonchi  ABDOMEN: Soft, non-tender, non-distended, No pulsatile  mass, MUSCULOSKELETAL: No deformity  SKIN: Warm and dry NEUROLOGIC:  Alert and oriented x 3 PSYCHIATRIC:  Normal affect   ASSESSMENT:    1. Aortic stenosis, mild   2. Chronic diastolic congestive heart failure (Breanna Webster)   3. Essential hypertension, benign   4. Dyspnea on exertion   5. Left bundle branch block   6. Type 2 diabetes mellitus without complication, without long-term current use of insulin (HCC)   7. Essential hypertension    PLAN:    In order of problems listed above:  1.  New York Heart Association functional class III-IV symptoms.  The symptoms are discordant with the severity of  aortic stenosis.  She is reluctant to do any exercise testing because of anxiety.  We will plan to do a left and right heart cath with coronary angiogram to evaluate left ventricular filling pressures.  We will also obtain a BNP.  Coronary angiography will be done at the same time.  This will allow [name] to determine by another means if there are significant cardiovascular factors that would make proceeding with noncardiac work-up unnecessary. 2. Right heart cath will help to determine if diastolic failure is contributing to symptoms.  We will also exclude pulmonary hypertension. 3. Blood pressures excellently controlled. 4. I suspect dyspnea is multifactorial: Physical deconditioning, potentially some underlying pulmonary process, and likely some heart failure. 5. Would need to be careful not to induce complete heart block during right heart cath. 6. Hemoglobin A1c less than 7 is our target.  The patient was counseled to undergo left heart catheterization, coronary angiography, and possible percutaneous coronary intervention with stent implantation. The procedural risks and benefits were discussed in detail. The risks discussed included death, stroke, myocardial infarction, life-threatening bleeding, limb ischemia, kidney injury, allergy, and possible emergency cardiac surgery. The risk of these  significant complications were estimated to occur less than 1% of the time. After discussion, the patient has agreed to proceed.  Greater than 50% of the time during this office visit  was spent in education, counseling, and coordination of care related to underlying disease process and testing as outlined.  Natural history of aortic stenosis discussed in detail and multiple questions by both husband and wife required attention.    Medication Adjustments/Labs and Tests Ordered: Current medicines are reviewed at length with the patient today.  Concerns regarding medicines are outlined above.  Orders Placed This Encounter  Procedures  . Basic Metabolic Panel (BMET)  . CBC  . Pro b natriuretic peptide  . EKG 12-Lead   No orders of the defined types were placed in this encounter.   Patient Instructions  Medication Instructions:  Your physician recommends that you continue on your current medications as directed. Please refer to the Current Medication list given to you today.  If you need a refill on your cardiac medications before your next appointment, please call your pharmacy.   Lab work: Lab work to be done today--BMP, CBC and BNP If you have labs (blood work) drawn today and your tests are completely normal, you will receive your results only by: Marland Kitchen MyChart Message (if you have MyChart) OR . A paper copy in the mail If you have any lab test that is abnormal or we need to change your treatment, we will call you to review the results.  Testing/Procedures: Your physician has requested that you have a cardiac catheterization. Cardiac catheterization is used to diagnose and/or treat various heart conditions. Doctors may recommend this procedure for a number of different reasons. The most common reason is to evaluate chest pain. Chest pain can be a symptom of coronary artery disease (CAD), and cardiac catheterization can show whether plaque is narrowing or blocking your heart's arteries.  This procedure is also used to evaluate the valves, as well as measure the blood flow and oxygen levels in different parts of your heart. For further information please visit HugeFiesta.tn. Please follow instruction sheet, as given.  Scheduled for October 31,2019  Follow-Up: To be arranged after catheterization.     [place] OFFICE [name], [place] [address] Dept: [phone] Loc: [place]  02/19/2018  You are scheduled for a Cardiac Catheterization on Thursday, October 31 with Dr. Daneen Schick.  1. Please arrive at the [facility] (Main Entrance A) at [facility]: [address] [place],  [address] at 10:00 AM (This time is two hours before your procedure to ensure your preparation). Free valet parking service is available.   Special note: Every effort is made to have your procedure done on time. Please understand that emergencies sometimes delay scheduled procedures.  2. Diet: Do not eat solid foods after midnight.  The patient may have clear liquids until 5am upon the day of the procedure.  3. Labs: Lab work was done in office on October 30,2019.  4. Medication instructions in preparation for your procedure:   Contrast Allergy: No  Do not take hydrochlorothiazide the morning of the procedure. Do not take insulin or oral diabetes medication the morning of the procedure.     On the morning of your procedure, take your Aspirin and any morning medicines NOT listed above.  You may use sips of water.  5. Plan for one night stay--bring personal belongings. 6. Bring a current list of your medications and current insurance cards. 7. You MUST have a responsible person to drive you home. 8. Someone MUST be with you the first 24  hours after you arrive home or your discharge will be delayed. 9. Please wear clothes that are easy to get on and  off and wear slip-on shoes.  Thank you for allowing [name] to care for you!   -- [facility]      Signed, Sinclair Grooms, MD  02/19/2018 12:24 PM    [place]

## 2018-02-19 ENCOUNTER — Ambulatory Visit (INDEPENDENT_AMBULATORY_CARE_PROVIDER_SITE_OTHER): Payer: Medicare Other | Admitting: Interventional Cardiology

## 2018-02-19 ENCOUNTER — Encounter: Payer: Self-pay | Admitting: Interventional Cardiology

## 2018-02-19 VITALS — BP 122/64 | HR 61 | Ht 65.0 in | Wt 203.1 lb

## 2018-02-19 DIAGNOSIS — R0600 Dyspnea, unspecified: Secondary | ICD-10-CM

## 2018-02-19 DIAGNOSIS — I10 Essential (primary) hypertension: Secondary | ICD-10-CM

## 2018-02-19 DIAGNOSIS — I447 Left bundle-branch block, unspecified: Secondary | ICD-10-CM | POA: Diagnosis not present

## 2018-02-19 DIAGNOSIS — I350 Nonrheumatic aortic (valve) stenosis: Secondary | ICD-10-CM

## 2018-02-19 DIAGNOSIS — R0609 Other forms of dyspnea: Secondary | ICD-10-CM | POA: Diagnosis not present

## 2018-02-19 DIAGNOSIS — I5032 Chronic diastolic (congestive) heart failure: Secondary | ICD-10-CM

## 2018-02-19 DIAGNOSIS — E119 Type 2 diabetes mellitus without complications: Secondary | ICD-10-CM | POA: Diagnosis not present

## 2018-02-19 LAB — CBC
Hematocrit: 35.3 % (ref 34.0–46.6)
Hemoglobin: 11.5 g/dL (ref 11.1–15.9)
MCH: 19.9 pg — ABNORMAL LOW (ref 26.6–33.0)
MCHC: 32.6 g/dL (ref 31.5–35.7)
MCV: 61 fL — ABNORMAL LOW (ref 79–97)
Platelets: 286 10*3/uL (ref 150–450)
RBC: 5.79 x10E6/uL — ABNORMAL HIGH (ref 3.77–5.28)
RDW: 16.8 % — ABNORMAL HIGH (ref 12.3–15.4)
WBC: 8.7 10*3/uL (ref 3.4–10.8)

## 2018-02-19 LAB — BASIC METABOLIC PANEL
BUN/Creatinine Ratio: 20 (ref 12–28)
BUN: 15 mg/dL (ref 8–27)
CO2: 25 mmol/L (ref 20–29)
Calcium: 9.9 mg/dL (ref 8.7–10.3)
Chloride: 103 mmol/L (ref 96–106)
Creatinine, Ser: 0.76 mg/dL (ref 0.57–1.00)
GFR calc Af Amer: 89 mL/min/{1.73_m2} (ref >59)
GFR calc non Af Amer: 78 mL/min/{1.73_m2} (ref >59)
Glucose: 162 mg/dL — ABNORMAL HIGH (ref 65–99)
Potassium: 4.5 mmol/L (ref 3.5–5.2)
Sodium: 136 mmol/L (ref 134–144)

## 2018-02-19 NOTE — Patient Instructions (Signed)
Medication Instructions:  Your physician recommends that you continue on your current medications as directed. Please refer to the Current Medication list given to you today.  If you need a refill on your cardiac medications before your next appointment, please call your pharmacy.   Lab work: Lab work to be done today--BMP, CBC and BNP If you have labs (blood work) drawn today and your tests are completely normal, you will receive your results only by: Marland Kitchen MyChart Message (if you have MyChart) OR . A paper copy in the mail If you have any lab test that is abnormal or we need to change your treatment, we will call you to review the results.  Testing/Procedures: Your physician has requested that you have a cardiac catheterization. Cardiac catheterization is used to diagnose and/or treat various heart conditions. Doctors may recommend this procedure for a number of different reasons. The most common reason is to evaluate chest pain. Chest pain can be a symptom of coronary artery disease (CAD), and cardiac catheterization can show whether plaque is narrowing or blocking your heart's arteries. This procedure is also used to evaluate the valves, as well as measure the blood flow and oxygen levels in different parts of your heart. For further information please visit HugeFiesta.tn. Please follow instruction sheet, as given.  Scheduled for October 31,2019  Follow-Up: To be arranged after catheterization.     [place] OFFICE [place], [place] [address] Dept: [phone] Loc: [place]  02/19/2018  You are scheduled for a Cardiac Catheterization on Thursday, October 31 with Dr. Daneen Schick.  1. Please arrive at the [facility] (Main Entrance A) at [facility]: [address] [place], [place] [address] at 10:00 AM (This time is two hours before your procedure to  ensure your preparation). Free valet parking service is available.   Special note: Every effort is made to have your procedure done on time. Please understand that emergencies sometimes delay scheduled procedures.  2. Diet: Do not eat solid foods after midnight.  The patient may have clear liquids until 5am upon the day of the procedure.  3. Labs: Lab work was done in office on October 30,2019.  4. Medication instructions in preparation for your procedure:   Contrast Allergy: No  Do not take hydrochlorothiazide the morning of the procedure. Do not take insulin or oral diabetes medication the morning of the procedure.     On the morning of your procedure, take your Aspirin and any morning medicines NOT listed above.  You may use sips of water.  5. Plan for one night stay--bring personal belongings. 6. Bring a current list of your medications and current insurance cards. 7. You MUST have a responsible person to drive you home. 8. Someone MUST be with you the first 24 hours after you arrive home or your discharge will be delayed. 9. Please wear clothes that are easy to get on and off and wear slip-on shoes.  Thank you for allowing [name] to care for you!   -- [place] [facility]

## 2018-02-20 ENCOUNTER — Encounter (HOSPITAL_COMMUNITY)
Admission: RE | Disposition: A | Discharge status: Home / Self Care | Payer: Self-pay | Source: Ambulatory Visit | Attending: Interventional Cardiology

## 2018-02-20 ENCOUNTER — Other Ambulatory Visit: Payer: Self-pay

## 2018-02-20 ENCOUNTER — Ambulatory Visit (HOSPITAL_COMMUNITY)
Admission: RE | Admit: 2018-02-20 | Discharge: 2018-02-20 | Disposition: A | Discharge status: Home / Self Care | Payer: Medicare Other | Source: Ambulatory Visit | Attending: Interventional Cardiology | Admitting: Interventional Cardiology

## 2018-02-20 ENCOUNTER — Telehealth: Payer: Self-pay | Admitting: Endocrinology

## 2018-02-20 DIAGNOSIS — K219 Gastro-esophageal reflux disease without esophagitis: Secondary | ICD-10-CM | POA: Insufficient documentation

## 2018-02-20 DIAGNOSIS — Z7982 Long term (current) use of aspirin: Secondary | ICD-10-CM | POA: Insufficient documentation

## 2018-02-20 DIAGNOSIS — I5032 Chronic diastolic (congestive) heart failure: Secondary | ICD-10-CM | POA: Insufficient documentation

## 2018-02-20 DIAGNOSIS — I110 Hypertensive heart disease with heart failure: Secondary | ICD-10-CM | POA: Diagnosis not present

## 2018-02-20 DIAGNOSIS — M1990 Unspecified osteoarthritis, unspecified site: Secondary | ICD-10-CM | POA: Insufficient documentation

## 2018-02-20 DIAGNOSIS — F419 Anxiety disorder, unspecified: Secondary | ICD-10-CM | POA: Diagnosis not present

## 2018-02-20 DIAGNOSIS — I350 Nonrheumatic aortic (valve) stenosis: Secondary | ICD-10-CM | POA: Diagnosis not present

## 2018-02-20 DIAGNOSIS — E119 Type 2 diabetes mellitus without complications: Secondary | ICD-10-CM | POA: Insufficient documentation

## 2018-02-20 DIAGNOSIS — Z794 Long term (current) use of insulin: Secondary | ICD-10-CM | POA: Diagnosis not present

## 2018-02-20 DIAGNOSIS — D563 Thalassemia minor: Secondary | ICD-10-CM | POA: Insufficient documentation

## 2018-02-20 DIAGNOSIS — M8580 Other specified disorders of bone density and structure, unspecified site: Secondary | ICD-10-CM | POA: Diagnosis not present

## 2018-02-20 DIAGNOSIS — R0609 Other forms of dyspnea: Secondary | ICD-10-CM

## 2018-02-20 DIAGNOSIS — E785 Hyperlipidemia, unspecified: Secondary | ICD-10-CM | POA: Diagnosis not present

## 2018-02-20 DIAGNOSIS — I080 Rheumatic disorders of both mitral and aortic valves: Secondary | ICD-10-CM | POA: Diagnosis not present

## 2018-02-20 DIAGNOSIS — E0865 Diabetes mellitus due to underlying condition with hyperglycemia: Secondary | ICD-10-CM

## 2018-02-20 DIAGNOSIS — I272 Pulmonary hypertension, unspecified: Secondary | ICD-10-CM | POA: Insufficient documentation

## 2018-02-20 DIAGNOSIS — R0600 Dyspnea, unspecified: Secondary | ICD-10-CM | POA: Diagnosis present

## 2018-02-20 DIAGNOSIS — E0500 Thyrotoxicosis with diffuse goiter without thyrotoxic crisis or storm: Secondary | ICD-10-CM | POA: Diagnosis not present

## 2018-02-20 DIAGNOSIS — IMO0002 Reserved for concepts with insufficient information to code with codable children: Secondary | ICD-10-CM

## 2018-02-20 DIAGNOSIS — I447 Left bundle-branch block, unspecified: Secondary | ICD-10-CM | POA: Insufficient documentation

## 2018-02-20 DIAGNOSIS — I2510 Atherosclerotic heart disease of native coronary artery without angina pectoris: Secondary | ICD-10-CM | POA: Diagnosis not present

## 2018-02-20 DIAGNOSIS — K589 Irritable bowel syndrome without diarrhea: Secondary | ICD-10-CM | POA: Insufficient documentation

## 2018-02-20 DIAGNOSIS — R0789 Other chest pain: Secondary | ICD-10-CM | POA: Diagnosis present

## 2018-02-20 HISTORY — PX: RIGHT/LEFT HEART CATH AND CORONARY ANGIOGRAPHY: CATH118266

## 2018-02-20 LAB — POCT I-STAT 3, VENOUS BLOOD GAS (G3P V)
Bicarbonate: 26 mmol/L (ref 20.0–28.0)
O2 Saturation: 71 %
TCO2: 27 mmol/L (ref 22–32)
pCO2, Ven: 48.8 mmHg (ref 44.0–60.0)
pH, Ven: 7.335 (ref 7.250–7.430)
pO2, Ven: 40 mmHg (ref 32.0–45.0)

## 2018-02-20 LAB — GLUCOSE, CAPILLARY
Glucose-Capillary: 119 mg/dL — ABNORMAL HIGH (ref 70–99)
Glucose-Capillary: 97 mg/dL (ref 70–99)
Glucose-Capillary: 155 mg/dL — ABNORMAL HIGH (ref 70–99)

## 2018-02-20 LAB — POCT I-STAT 3, ART BLOOD GAS (G3+)
Acid-base deficit: 1 mmol/L (ref 0.0–2.0)
Bicarbonate: 24.3 mmol/L (ref 20.0–28.0)
O2 Saturation: 98 %
TCO2: 26 mmol/L (ref 22–32)
pCO2 arterial: 44.5 mmHg (ref 32.0–48.0)
pH, Arterial: 7.346 — ABNORMAL LOW (ref 7.350–7.450)
pO2, Arterial: 116 mmHg — ABNORMAL HIGH (ref 83.0–108.0)

## 2018-02-20 LAB — PRO B NATRIURETIC PEPTIDE: NT-Pro BNP: 270 pg/mL (ref 0–301)

## 2018-02-20 SURGERY — RIGHT/LEFT HEART CATH AND CORONARY ANGIOGRAPHY
Anesthesia: LOCAL

## 2018-02-20 MED ORDER — HEPARIN (PORCINE) IN NACL 1000-0.9 UT/500ML-% IV SOLN
INTRAVENOUS | Status: AC
  Filled 2018-02-20: qty 1000

## 2018-02-20 MED ORDER — SODIUM CHLORIDE 0.9% FLUSH: 3.0000 mL | INTRAVENOUS | Status: DC | PRN

## 2018-02-20 MED ORDER — SODIUM CHLORIDE 0.9 % WEIGHT BASED INFUSION
3.0000 mL/kg/h | INTRAVENOUS | Status: AC
  Administered 2018-02-20: 3 mL/kg/h via INTRAVENOUS

## 2018-02-20 MED ORDER — ONDANSETRON HCL 4 MG/2ML IJ SOLN: 4.0000 mg | Freq: Four times a day (QID) | INTRAMUSCULAR | Status: DC | PRN

## 2018-02-20 MED ORDER — LIDOCAINE HCL (PF) 1 % IJ SOLN
INTRAMUSCULAR | Status: DC | PRN
  Administered 2018-02-20 (×2): 2 mL

## 2018-02-20 MED ORDER — SODIUM CHLORIDE 0.9 % WEIGHT BASED INFUSION: 1.0000 mL/kg/h | INTRAVENOUS | Status: DC

## 2018-02-20 MED ORDER — SODIUM CHLORIDE 0.9 % IV SOLN: 250.0000 mL | INTRAVENOUS | Status: DC | PRN

## 2018-02-20 MED ORDER — VERAPAMIL HCL 2.5 MG/ML IV SOLN
INTRAVENOUS | Status: AC
  Filled 2018-02-20: qty 2

## 2018-02-20 MED ORDER — ASPIRIN 81 MG PO CHEW: 81.0000 mg | CHEWABLE_TABLET | ORAL | Status: DC

## 2018-02-20 MED ORDER — HEPARIN SODIUM (PORCINE) 1000 UNIT/ML IJ SOLN
INTRAMUSCULAR | Status: DC | PRN
  Administered 2018-02-20: 4500 [IU] via INTRAVENOUS

## 2018-02-20 MED ORDER — SODIUM CHLORIDE 0.9% FLUSH: 3.0000 mL | Freq: Two times a day (BID) | INTRAVENOUS | Status: DC

## 2018-02-20 MED ORDER — FENTANYL CITRATE (PF) 100 MCG/2ML IJ SOLN
INTRAMUSCULAR | Status: DC | PRN
  Administered 2018-02-20: 25 ug via INTRAVENOUS

## 2018-02-20 MED ORDER — LIDOCAINE HCL (PF) 1 % IJ SOLN
INTRAMUSCULAR | Status: AC
  Filled 2018-02-20: qty 30

## 2018-02-20 MED ORDER — MIDAZOLAM HCL 2 MG/2ML IJ SOLN
INTRAMUSCULAR | Status: AC
  Filled 2018-02-20: qty 2

## 2018-02-20 MED ORDER — HEPARIN (PORCINE) IN NACL 1000-0.9 UT/500ML-% IV SOLN
INTRAVENOUS | Status: DC | PRN
  Administered 2018-02-20: 500 mL

## 2018-02-20 MED ORDER — VERAPAMIL HCL 2.5 MG/ML IV SOLN
INTRAVENOUS | Status: DC | PRN
  Administered 2018-02-20: 10 mL via INTRA_ARTERIAL

## 2018-02-20 MED ORDER — ACETAMINOPHEN 325 MG PO TABS: 650.0000 mg | ORAL_TABLET | ORAL | Status: DC | PRN

## 2018-02-20 MED ORDER — OXYCODONE HCL 5 MG PO TABS: 5.0000 mg | ORAL_TABLET | ORAL | Status: DC | PRN

## 2018-02-20 MED ORDER — IOHEXOL 350 MG/ML SOLN
INTRAVENOUS | Status: DC | PRN
  Administered 2018-02-20: 80 mL via INTRA_ARTERIAL

## 2018-02-20 MED ORDER — FENTANYL CITRATE (PF) 100 MCG/2ML IJ SOLN
INTRAMUSCULAR | Status: AC
  Filled 2018-02-20: qty 2

## 2018-02-20 MED ORDER — MIDAZOLAM HCL 2 MG/2ML IJ SOLN
INTRAMUSCULAR | Status: DC | PRN
  Administered 2018-02-20: 1 mg via INTRAVENOUS

## 2018-02-20 MED ORDER — SODIUM CHLORIDE 0.9 % IV SOLN: INTRAVENOUS | Status: DC

## 2018-02-20 SURGICAL SUPPLY — 14 items
CATH 5FR JL3.5 JR4 ANG PIG MP (CATHETERS) ×1 IMPLANT
CATH BALLN WEDGE 5F 110CM (CATHETERS) ×1 IMPLANT
DEVICE RAD COMP TR BAND LRG (VASCULAR PRODUCTS) ×1 IMPLANT
GLIDESHEATH SLEND A-KIT 6F 22G (SHEATH) ×1 IMPLANT
GUIDEWIRE .025 260CM (WIRE) ×1 IMPLANT
GUIDEWIRE INQWIRE 1.5J.035X260 (WIRE) IMPLANT
INQWIRE 1.5J .035X260CM (WIRE) ×2
KIT HEART LEFT (KITS) ×2 IMPLANT
PACK CARDIAC CATHETERIZATION (CUSTOM PROCEDURE TRAY) ×2 IMPLANT
SHEATH GLIDE SLENDER 4/5FR (SHEATH) ×1 IMPLANT
SHEATH PROBE COVER 6X72 (BAG) ×1 IMPLANT
TRANSDUCER W/STOPCOCK (MISCELLANEOUS) ×2 IMPLANT
TUBING CIL FLEX 10 FLL-RA (TUBING) ×2 IMPLANT
WIRE EMERALD ST .035X260CM (WIRE) ×1 IMPLANT

## 2018-02-20 NOTE — Telephone Encounter (Signed)
They mean to skip it today, then resume usual tomorrow AM.

## 2018-02-20 NOTE — Interval H&P Note (Signed)
Cath Lab Visit (complete for each Cath Lab visit)  Clinical Evaluation Leading to the Procedure:   ACS: No.  Non-ACS:    Anginal Classification: CCS III  Anti-ischemic medical therapy: Minimal Therapy (1 class of medications)  Non-Invasive Test Results: Intermediate-risk stress test findings: cardiac mortality 1-3%/year  Prior CABG: No previous CABG      History and Physical Interval Note:  02/20/2018 11:26 AM  Breanna Webster  has presented today for surgery, with the diagnosis of as  The various methods of treatment have been discussed with the patient and family. After consideration of risks, benefits and other options for treatment, the patient has consented to  Procedure(s): RIGHT/LEFT HEART CATH AND CORONARY ANGIOGRAPHY (N/A) as a surgical intervention .  The patient's history has been reviewed, patient examined, no change in status, stable for surgery.  I have reviewed the patient's chart and labs.  Questions were answered to the patient's satisfaction.     [name] III

## 2018-02-20 NOTE — CV Procedure (Signed)
   Right and left heart cath via right arm brachial and radial access.  70 to 80% mid RCA.  Peak to peak aortic valve gradient 21, mean 18 and aortic valve area using the peak to peak pressure is 1.21 cm  Normal LV systolic function  Severe pulmonary hypertension  Further management as work-up dictates.

## 2018-02-20 NOTE — Discharge Instructions (Signed)

## 2018-02-20 NOTE — Telephone Encounter (Signed)
Please advise 

## 2018-02-20 NOTE — Telephone Encounter (Signed)
Patient called and received the [facility] . They called and stated that the patient had a Cardiac Catheter placed this morning and they advised patient to not take any of her insulin  Patient is concerned on what she should do.    Please advise Patients phone [phone]

## 2018-02-21 ENCOUNTER — Encounter (HOSPITAL_COMMUNITY): Payer: Self-pay | Admitting: Interventional Cardiology

## 2018-02-21 ENCOUNTER — Telehealth: Payer: Self-pay | Admitting: Internal Medicine

## 2018-02-21 DIAGNOSIS — I272 Pulmonary hypertension, unspecified: Secondary | ICD-10-CM | POA: Insufficient documentation

## 2018-02-21 NOTE — Telephone Encounter (Signed)
Tanda Rockers, MD sent to [name], MD  Cc: Rosana Berger, CMA        Thanks - very helpful!  We'll get her back asap to complete the w/u - we usually do V/Q for [facility] w/u's and I'll get her back asap for this and do the sleep study as well   Magda Paganini: go ahead and schedule   ---------------------------------------------------------------------------------------------------------------------------------------------  Per MW- schedule ONO on RA and VQ scan for pulmonary hypertension and then needs ov with MW for f/u   Spoke with the pt and notified of recs  She verbalized understanding  I have ordered tests and she is aware to schedule appt with MW once these are completed

## 2018-02-21 NOTE — Telephone Encounter (Signed)
Called pt and informed her of Dr. Cordelia Pen response. Verbalized acceptance and understanding.

## 2018-02-25 ENCOUNTER — Telehealth: Payer: Self-pay | Admitting: Gastroenterology

## 2018-02-25 ENCOUNTER — Telehealth: Payer: Self-pay | Admitting: Interventional Cardiology

## 2018-02-25 NOTE — Telephone Encounter (Signed)
New Message   Pt is calling, wanting to know if Dr. Tamala Julian spoke to Dr. Havery Moros about some up coming test he didn't want the pt to have. Please call

## 2018-02-25 NOTE — Telephone Encounter (Signed)
It is okay to proceed with esophageal manometry.

## 2018-02-25 NOTE — Telephone Encounter (Signed)
Pt is suppose to have an Esophageal Manometry on 03/03/18.  Pt wants to know if you spoke with Dr. Duanne Guess about this test?  Can she proceed with procedure?

## 2018-02-25 NOTE — Telephone Encounter (Signed)
The pt will not be sedated for manometry.   The pt has been advised of the information and verbalized understanding.

## 2018-02-25 NOTE — Telephone Encounter (Signed)
Thank you. Yes I think okay to proceed with manometry and pH test, that is a very safe procedure which does not entail any sedation. I will hold off on her colonoscopy until her workup for the pulmonary hypertension has been completed and she is stable from that perspective. Thanks

## 2018-02-25 NOTE — Telephone Encounter (Signed)
Spoke with pt and made her aware.  Pt now asking when she should follow up with Dr. Tamala Julian?  She currently has no f/u and had the cath 10/31.  Advised I will send to Dr. Tamala Julian to find out.

## 2018-02-26 ENCOUNTER — Other Ambulatory Visit: Payer: Self-pay | Admitting: Endocrinology

## 2018-02-27 NOTE — Telephone Encounter (Signed)
Dr. Tamala Julian- when would you like for pt to f/u with you?

## 2018-02-28 ENCOUNTER — Ambulatory Visit (HOSPITAL_COMMUNITY)
Admission: RE | Admit: 2018-02-28 | Discharge: 2018-02-28 | Disposition: A | Discharge status: Home / Self Care | Payer: Medicare Other | Source: Ambulatory Visit | Attending: Internal Medicine | Admitting: Internal Medicine

## 2018-02-28 DIAGNOSIS — I272 Pulmonary hypertension, unspecified: Secondary | ICD-10-CM

## 2018-02-28 MED ORDER — TECHNETIUM TO 99M ALBUMIN AGGREGATED
308.0000 | Freq: Once | INTRAVENOUS | Status: AC | PRN
  Administered 2018-02-28: 308 via INTRAVENOUS

## 2018-02-28 MED ORDER — TECHNETIUM TC 99M DIETHYLENETRIAME-PENTAACETIC ACID
28.5000 | Freq: Once | INTRAVENOUS | Status: AC | PRN
  Administered 2018-02-28: 28.5 via INTRAVENOUS

## 2018-02-28 NOTE — Telephone Encounter (Signed)
Attempted to contact pt.  Phone line busy.

## 2018-02-28 NOTE — Telephone Encounter (Signed)
Follow-up with me after work-up for pulmonary hypertension by Dr. Melvyn Novas.

## 2018-02-28 NOTE — Progress Notes (Signed)
Spoke with pt and notified of results per Dr. Melvyn Novas. Pt verbalized understanding and denied any questions. appt scheduled

## 2018-02-28 NOTE — Progress Notes (Signed)
Spoke with pt and notified of results per Dr. Wert. Pt verbalized understanding and denied any questions. 

## 2018-03-01 DIAGNOSIS — J449 Chronic obstructive pulmonary disease, unspecified: Secondary | ICD-10-CM | POA: Diagnosis not present

## 2018-03-01 DIAGNOSIS — R0902 Hypoxemia: Secondary | ICD-10-CM | POA: Diagnosis not present

## 2018-03-03 ENCOUNTER — Ambulatory Visit (HOSPITAL_COMMUNITY)
Admission: RE | Admit: 2018-03-03 | Discharge: 2018-03-03 | Disposition: A | Discharge status: Home / Self Care | Payer: Medicare Other | Source: Ambulatory Visit | Attending: Gastroenterology | Admitting: Gastroenterology

## 2018-03-03 ENCOUNTER — Telehealth: Payer: Self-pay

## 2018-03-03 ENCOUNTER — Encounter (HOSPITAL_COMMUNITY)
Admission: RE | Disposition: A | Discharge status: Home / Self Care | Payer: Self-pay | Source: Ambulatory Visit | Attending: Gastroenterology

## 2018-03-03 DIAGNOSIS — R05 Cough: Secondary | ICD-10-CM | POA: Diagnosis not present

## 2018-03-03 DIAGNOSIS — K219 Gastro-esophageal reflux disease without esophagitis: Secondary | ICD-10-CM

## 2018-03-03 DIAGNOSIS — R059 Cough, unspecified: Secondary | ICD-10-CM

## 2018-03-03 HISTORY — PX: 24 HOUR PH STUDY: SHX5419

## 2018-03-03 HISTORY — PX: ESOPHAGEAL MANOMETRY: SHX5429

## 2018-03-03 SURGERY — MANOMETRY, ESOPHAGUS

## 2018-03-03 MED ORDER — LIDOCAINE VISCOUS HCL 2 % MT SOLN
OROMUCOSAL | Status: AC
  Filled 2018-03-03: qty 15

## 2018-03-03 SURGICAL SUPPLY — 2 items
FACESHIELD LNG OPTICON STERILE (SAFETY) IMPLANT
GLOVE BIO SURGEON STRL SZ8 (GLOVE) ×4 IMPLANT

## 2018-03-03 NOTE — Telephone Encounter (Signed)
Spoke to patient she wanted to know if she could take her diabetic medications this morning. Read back to her the instructions she received at her office visit on 02/11/18 about medications. Advised to bring her medications with her and could take them afterwards.  She also asked about scheduling a colonoscopy. I let her know that Dr. Havery Moros is aware of that request and he wants to hold off on scheduling until seen and cleared by pulmonology. I let patient know that Dr. Francesca Oman office also wanted to follow up with her but was unable to reach her. She will call their office.

## 2018-03-03 NOTE — Progress Notes (Signed)
Esophageal manometry done per protocol. Pt tolerated fair on second attempted after spaying throat with one spray of cetacaine. No complications noted. PH with impedance probe placed without distress or complication per protocol. Pt tolerated well placed at 35cm. Teaching done with pt using teach back regarding monitor and study. Pt voiced understanding and will return tomorrow at 1350 to have probe removed and monitor downloaded.

## 2018-03-04 NOTE — Telephone Encounter (Signed)
Spoke with pt and made her aware of Dr. Thompson Caul recommendation.  Pt will contact the office for an appt once everything is worked out from Psychiatrist.

## 2018-03-05 ENCOUNTER — Telehealth: Payer: Self-pay | Admitting: Internal Medicine

## 2018-03-05 ENCOUNTER — Encounter (HOSPITAL_COMMUNITY): Payer: Self-pay | Admitting: Gastroenterology

## 2018-03-05 NOTE — Telephone Encounter (Signed)
Per MW ONO on RA was normal  Test done by [facility] on 03/01/18  Spoke with pt's spouse and notified of results/recs  He verbalized understanding and will inform the pt

## 2018-03-06 DIAGNOSIS — D485 Neoplasm of uncertain behavior of skin: Secondary | ICD-10-CM | POA: Diagnosis not present

## 2018-03-06 DIAGNOSIS — B079 Viral wart, unspecified: Secondary | ICD-10-CM | POA: Diagnosis not present

## 2018-03-06 DIAGNOSIS — L821 Other seborrheic keratosis: Secondary | ICD-10-CM | POA: Diagnosis not present

## 2018-03-06 DIAGNOSIS — Z23 Encounter for immunization: Secondary | ICD-10-CM | POA: Diagnosis not present

## 2018-03-07 DIAGNOSIS — K219 Gastro-esophageal reflux disease without esophagitis: Secondary | ICD-10-CM

## 2018-03-07 DIAGNOSIS — R05 Cough: Secondary | ICD-10-CM

## 2018-03-07 DIAGNOSIS — R059 Cough, unspecified: Secondary | ICD-10-CM

## 2018-03-13 ENCOUNTER — Ambulatory Visit (INDEPENDENT_AMBULATORY_CARE_PROVIDER_SITE_OTHER): Payer: Medicare Other | Admitting: Internal Medicine

## 2018-03-13 ENCOUNTER — Encounter: Payer: Self-pay | Admitting: Internal Medicine

## 2018-03-13 DIAGNOSIS — R058 Other specified cough: Secondary | ICD-10-CM

## 2018-03-13 DIAGNOSIS — R0600 Dyspnea, unspecified: Secondary | ICD-10-CM

## 2018-03-13 DIAGNOSIS — R0609 Other forms of dyspnea: Secondary | ICD-10-CM | POA: Diagnosis not present

## 2018-03-13 DIAGNOSIS — I272 Pulmonary hypertension, unspecified: Secondary | ICD-10-CM | POA: Diagnosis not present

## 2018-03-13 DIAGNOSIS — R05 Cough: Secondary | ICD-10-CM

## 2018-03-13 NOTE — Progress Notes (Addendum)
Subjective:     Patient ID: Breanna Webster, female   DOB: [birth year]     MRN: [id number]    Brief patient profile:  19  yowf never smoker with pnds as child allergy tested as child doesn't know details, never took shots,  outgrew completely then symptoms resumed in 90's after moved to [place] from [facility] with coughing that improved p NF late 90s  but still had drainage variably  severe no pattern as to time of year then much worse since summer 2016 with onset of cough/wheeze May 2017 referred to pulmonary clinic 10/14/2015 by Dr Betty [name].      History of Present Illness  10/14/2015 1st [facility] office visit/ [facility]   Chief Complaint  Patient presents with  . Pulmonary Consult    Referred by Dr. Betty [name]. Pt c/o cough and wheezing x 5 wks. Cough is occ prod with minimal green sputum.   acute onset cough > green mucus one week p husband's uri May 2017 >  rx with abx/ prednisone improved then worse 2 days p finished but mostly productive  white mucus.  If doesn't take the codeine cough med then coughs worse at hs.  No assoc sob/ some wheeze better with saba  rec The key to effective treatment for your cough is eliminating the non-stop cycle of cough you're stuck in long enough to let your airway heal completely and then see if there is anything still making you cough once you stop the cough suppression, but this should take no more than 5 days to figure out Eliminate all coughing for 3 days with your codiene syrup and then switch delsym 2 tsp every 12 hours  Prednisone 10 mg take  4 each am x 2 days,   2 each am x 2 days,  1 each am x 2 days and stop (this is to eliminate allergies and inflammation from coughing) Protonix (pantoprazole) Take 30-60 min before first meal of the day and Pepcid 20 mg one bedtime plus chlorpheniramine 4 mg x 2 at bedtime (both available over the counter)  until cough is completely gone for at least a week without the need for cough suppression GERD  diet    11/02/2015 acute extended ov/Wert re: cough since may 2017   Chief Complaint  Patient presents with  . Pulmonary Consult    Cough has only improved some. She is still wheezing and her cough is prod with green sputum.    worse in am and at bedtime and slt green mucus again p initially turning white  rec Please see patient coordinator before you leave today  to schedule sinus CT and we will call results  For drainage / throat tickle try take CHLORPHENIRAMINE  4 mg - take one every 4 hours as needed -  Please remember to go to the lab  department downstairs for your tests - we will call you with the results when they are available. Until cough gone 100% with no need for any cough medicine > prilosec or pantoprazole 40 mg proton pump inhibitor and continue pepcid 20 mg at hs Take delsym two tsp every 12 hours and supplement if needed with tylenol #3  up to 2 every 4 hours to suppress the urge to cough.  Once you have eliminated the cough for 3 straight days try reducing the tylenol #3  first,  then the delsym as tolerated.   Stop toprol (lopressor/metaprolol)  And start bisoprolol 5 mg one daily  12/13/2015  f/u ov/Wert re: cyclical cough/ gerd Chief Complaint  Patient presents with  . Follow-up    Cough has resolved completely and no more wheezing for the past month. No new co's.    no cough at all/ on no gerd meds/ on bisoprolol instead of metaprolol and doing great  rec Remember at the onset of any respiratory complaint :  Try prilosec otc 12m  Take 30-60 min before first meal of the day and Pepcid ac (famotidine) 20 mg one @  bedtime until completely better for a week For drainage / throat tickle try take CHLORPHENIRAMINE  4 mg - take one every 4 hours as needed -          07/19/2016 acute extended ov/Wert re: sob no longer on gerd rx "I don't have any HB"  Chief Complaint  Patient presents with  . Acute Visit    Increased SOB x 3 months. She states that she gets  SOB with any exertion at all.    new problem =  Sob with exertion lifelong always the last one off the playground and trouble with steps and hills as long as she can recall Baseline = walking up to 45 min flat at [employer] [facility] s stopping  and 30 min on bicycle at "moderate pace and resistance"  Took some time off due to back pain and now can't walk 5 min with back pain and sob with back pain so severe today in w/c and pain shooting down R leg to foot s/p failed injections so far Never on inhalers  rec Nexium 40 (or prilosec 20 x 2) Take 30- 60 min before your first and last meals of the day  GERD  Diet     03/20/17 EGD [name]:  Loose NF, Barrett's    11/11/2017  Extended f/u ov/Wert re: doe/ recurrent cough on gerd rx  Chief Complaint  Patient presents with  . Follow-up    Pt states she still has the cough. Pt states she will have spasms that will bring on her cough. Pt also has complaints of SOB with exertion and has chest tightness.  Dyspnea:  MMRC3 = can't walk 100 yards even at a slow pace at a flat grade s stopping due to sob  - only able to shop by pushing cart around "anywhere" but with freq stops  Cough: dry sporadic daytime / eating and talking seem to trigger it mostly  rec  To get the most out of exercise, you need to be continuously aware that you are short of breath, but never out of breath, for 30 minutes daily. As you improve, it will actually be easier for you to do the same amount of exercise  in  30 minutes so always push to the level where you are short of breath.    We will arrange a cpst in one month no sooner and I will call you with the results needs repeat echo prior to  CPST >  Pos AS     W/u for AS  02/20/18 LHC/RHC  Mean gradient  18 with LVEDP 55 and mod PH wih PVR 294 > ONO RA ok/ v/q nl     03/13/2018  Extended  f/u ov/[facility]ert re: doe/ chronic cough? ? [place]  Chief Complaint  Patient presents with  . Follow-up    Cough still present and  getting worse - productive clear most of the time.   Dyspnea:  One half block then has  to stop due to cough and sob  Cough: daytime / variable during the day,  Up to a tbsp clear/ gag but no vomit Sleeping: flat with 2 pillows  SABA use: none 02: no  Records show best response was to cyclical cough regimen which has never been repeated since 11/02/15   No obvious day to day or daytime variability or assoc excess/ purulent sputum or mucus plugs or hemoptysis or cp or chest tightness, subjective wheeze or overt sinus or hb symptoms.   Sleeps as above without nocturnal  or early am exacerbation  of respiratory  c/o's or need for noct saba. Also denies any obvious fluctuation of symptoms with weather or environmental changes or other aggravating or alleviating factors except as outlined above   No unusual exposure hx or h/o childhood pna/ asthma or knowledge of premature birth.  Current Allergies, Complete Past Medical History, Past Surgical History, Family History, and Social History were reviewed in [employer] record.  ROS  The following are not active complaints unless bolded Hoarseness, sore throat, dysphagia, dental problems, itching, sneezing,  nasal congestion or discharge of excess mucus or purulent secretions, ear ache,   fever, chills, sweats, unintended wt loss or wt gain, classically pleuritic or exertional cp,  orthopnea pnd or arm/hand swelling  or leg swelling, presyncope, palpitations, abdominal pain, anorexia, nausea, vomiting, diarrhea  or change in bowel habits or change in bladder habits, change in stools or change in urine, dysuria, hematuria,  rash, arthralgias, visual complaints, headache, numbness, weakness or ataxia or problems with walking or coordination,  change in mood or  memory.        Current Meds  Medication Sig  . aspirin 81 MG tablet Take 81 mg by mouth daily.    . bisoprolol (ZEBETA) 5 MG tablet Take 1 tablet (5 mg total) by mouth daily.   . cholecalciferol (VITAMIN D) 1000 units tablet Take 1,000 Units by mouth daily.  Marland Kitchen escitalopram (LEXAPRO) 20 MG tablet TAKE 1 TABLET BY MOUTH ONCE DAILY  . hydrochlorothiazide (HYDRODIURIL) 12.5 MG tablet TAKE 1 TABLET(12.5 MG) BY MOUTH DAILY (Patient taking differently: Take 12.5 mg by mouth daily. )  . insulin NPH Human (NOVOLIN N RELION) 100 UNIT/ML injection Inject 1.2 mLs (120 Units total) into the skin every morning. And syringes 1/day  . Insulin Syringe-Needle U-100 (BD INSULIN SYRINGE U/F) 31G X 5/16" 1 ML MISC USE TO INJECT INSULIN ONCE DAILY  . losartan (COZAAR) 50 MG tablet Take 1 tablet (50 mg total) by mouth daily.  . Multiple Vitamin (MULITIVITAMIN WITH MINERALS) TABS Take 1 tablet by mouth daily.  [name] DELICA LANCETS 21J MISC USE TO CHECK BLOOD SUGAR TWICE DAILY  . ONETOUCH VERIO test strip USE TO CHECK BLOOD SUGAR TWICE DAILY  . pantoprazole (PROTONIX) 40 MG tablet Take 1 tablet (40 mg total) by mouth 2 (two) times daily.  Marland Kitchen SYNTHROID 175 MCG tablet TAKE 1 TABLET BY MOUTH ONCE DAILY.  . vitamin E 400 UNIT capsule Take 400 Units by mouth daily.                      Objective:   Physical Exam    amb wf nad   03/13/2018      202  11/11/2017        204  07/19/2016        196 per pt  12/13/2015       193   11/02/2015  193  10/14/15 192 lb (87.091 kg)  10/10/15 190 lb (86.183 kg)  10/07/15 189 lb 4 oz (85.843 kg)    Vital signs reviewed - Note on arrival 02 sats  96% on RA       HEENT: nl dentition, turbinates bilaterally, and oropharynx. Nl external ear canals without cough reflex   NECK :  without JVD/Nodes/TM/ nl carotid upstrokes bilaterally   LUNGS: no acc muscle use,  Nl contour chest which is clear to A and P bilaterally without cough on insp or exp maneuvers   CV:  RRR  II-III/VI  Sem  no s3  / slt  increase in P2, and no edema   ABD:  soft and nontender with nl inspiratory excursion in the supine position. No bruits or organomegaly  appreciated, bowel sounds nl  MS:  Nl gait/ ext warm without deformities, calf tenderness, cyanosis or clubbing No obvious joint restrictions   SKIN: warm and dry without lesions    NEURO:  alert, approp, nl sensorium with  no motor or cerebellar deficits apparent.           I personally reviewed images and agree with radiology impression as follows:  CXR:   02/28/18  Enlargement of cardiac silhouette. Bronchitic changes without infiltrate.  V/Q  02/28/18 Very low probability for pulmonary embolism.              Assessment:

## 2018-03-13 NOTE — Patient Instructions (Addendum)
If you are asked to stop the aspirin for any reason check with Dr Tamala Julian  There is no pulmonary reason you cannot have dental surgery at this point  For drainage / throat tickle try take CHLORPHENIRAMINE  4 mg (chlortabs 4 mg from [facility]) - take one every 4 hours as needed - available over the counter- may cause drowsiness so start with just a bedtime dose or two and see how you tolerate it before trying in daytime.  GERD (REFLUX)  is an extremely common cause of respiratory symptoms just like yours , many times with no obvious heartburn at all.    It can be treated with medication, but also with lifestyle changes including elevation of the head of your bed (ideally with 6 inch - 8 inch bed blocks),  Smoking cessation, avoidance of late meals, excessive alcohol, and avoid fatty foods, chocolate, peppermint, colas, red wine, and acidic juices such as orange juice.  NO MINT OR MENTHOL PRODUCTS SO NO COUGH DROPS   USE SUGARLESS CANDY INSTEAD (Jolley ranchers or Stover's or Life Savers) or even ice chips will also do - the key is to swallow to prevent all throat clearing. NO OIL BASED VITAMINS - use powdered substitutes.   Please schedule a follow up office visit in 6 weeks, call sooner if needed with all medications /inhalers/ solutions in hand so we can verify exactly what you are taking. This includes all medications from all doctors and over the counters  - add full pfts on return - move up to 2 weeks and add tyl #3 in meantime if still can't stop coughing

## 2018-03-14 ENCOUNTER — Encounter: Payer: Self-pay | Admitting: Internal Medicine

## 2018-03-14 ENCOUNTER — Telehealth: Payer: Self-pay | Admitting: *Deleted

## 2018-03-14 ENCOUNTER — Telehealth: Payer: Self-pay | Admitting: Gastroenterology

## 2018-03-14 NOTE — Telephone Encounter (Signed)
-----   Message from Tanda Rockers, MD sent at 03/14/2018  5:22 AM EST ----- After chart review rec change f/u to 2 weeks and bring all meds for pfts same day -  If still coughing the same by the first week in Dec would like her to go back to tyl #3 one q 4h just like before to see if stopping the cough stops the cough

## 2018-03-14 NOTE — Assessment & Plan Note (Signed)
Echo 12/14/15 Left ventricle: The cavity size was normal. Systolic function was   normal. The estimated ejection fraction was in the range of 60%   to 65%. Wall motion was normal; there were no regional wall   motion abnormalities. Doppler parameters are consistent with   abnormal left ventricular relaxation (grade 1 diastolic   dysfunction). - Aortic valve: Trileaflet; moderately calcified leaflets. There   was mild stenosis. There was mild regurgitation. Mean gradient   (S): 15 mm Hg. Peak gradient (S): 29 mm Hg. - Mitral valve: Moderately calcified annulus. Mildly calcified   leaflets . There was no significant regurgitation. - Left atrium: The atrium was mildly to moderately dilated. - Right ventricle: The cavity size was normal. Systolic function   was normal. Myoview  3/19/ 2018  Nl EF/ no CAD - Spirometry 07/19/2016   FEV1 1.96 (85%)  Ratio 94  rec restart gerd rx/ treat back pain  -  Spirometry 11/11/2017  FEV1 1.76 (78%)  Ratio 75  slt abn f/v contour  -- 11/11/2017  Walked RA x 3 laps @ 185 ft each stopped due to  End of study, fast pace, no  desat / stopped once due to sob    - echo 12/09/2017  - Procedure narrative: Transthoracic echocardiography. Image quality was suboptimal. The study was technically difficult, as a result of poor acoustic windows, poor sound wave transmission, and body habitus. Intravenous contrast (Definity) was administered. - Left ventricle: The cavity size was normal. Systolic function was vigorous. The estimated ejection fraction was in the range of 65% to 70%. Wall motion was normal; there were no regional wall motion abnormalities. Doppler parameters are consistent with abnormal left ventricular relaxation (grade 1 diastolic dysfunction). - Aortic valve: Trileaflet; moderately thickened, moderately calcified leaflets. Valve mobility was restricted. There was moderate stenosis. Peak velocity (S): 346 cm/s. Mean gradient (S):  26 mm Hg. Peak gradient (S): 48 mm Hg. Valve area (VTI): 1.19 cm  - Mitral valve: Severely calcified annulus. Valve area by pressure half-time: 2.44 cm  - Left atrium: The atrium was mildly dilated  - RHC 02/20/18  PA mean = 41 with wedge 18 / CO 6.24  So PVR = 294 (nl < 250)  V/q lung scan neg for PE and not suggestive of PH  - 03/13/2018   Walked RA  2 laps @210  ft each stopped due to end of study, mod fast pace, min sob, no desats, started coughing at end   Clearly her doe is multifactorial but likely related to deconditioning more than PH or diastolic dysfunction from AS at this point and ? Whether any of the PAH drugs would improve this > would ask Dr Aundra Dubin to see for this issue and let me focus on her other chief concern = cough (see separate a/p)

## 2018-03-14 NOTE — Telephone Encounter (Signed)
Spoke with the pt and notified of recs per MW  Appt's scheduled and she will call first wk in Dec for tyelnol 3 rx if still coughing

## 2018-03-14 NOTE — Assessment & Plan Note (Signed)
Cyclical cough rx 1/61/0960 >>> repeat 11/03/2015 as did not follow instructions the first time > resolved  - FENO 11/02/2015  =   6 - Allergy profile 11/02/2015 >  Eos 0.1 /  IgE  137 dust only  - Sinus CT 11/09/2015 > Mucosal thickening inferior right maxillary antrum. Visualized paranasal sinuses elsewhere clear. - 12/13/2015 all better off all meds p cyclical cough rx/ gerd rx  - Allergy profile 07/19/16 >  Eos 0.2 /  IgE  192 RAST pos dust only 03/20/17 EGD [name]:  Loose NF, Barrett's   - 03/03/18 nl manometry and Ph impedence studies   In retrospect it appears she has classic Upper airway cough syndrome (previously labeled PNDS),  is so named because it's frequently impossible to sort out how much is  CR/sinusitis with freq throat clearing (which can be related to primary GERD)   vs  causing  secondary (" extra esophageal")  GERD from wide swings in gastric pressure that occur with throat clearing, often  promoting self use of mint and menthol lozenges that reduce the lower esophageal sphincter tone and exacerbate the problem further in a cyclical fashion.   These are the same pts (now being labeled as having "irritable larynx syndrome" by some cough centers) who not infrequently have a history of having failed to tolerate ace inhibitors,  dry powder inhalers or biphosphonates or report having atypical/extraesophageal reflux symptoms that don't respond to standard doses of PPI  and are easily confused as having aecopd or asthma flares by even experienced allergists/ pulmonologists (myself included).    Of the three most common causes of  Sub-acute / recurrent or chronic cough, only one (GERD)  can actually contribute to/ trigger  the other two (asthma and post nasal drip syndrome)  and perpetuate the cylce of cough.  While not intuitively obvious, many patients with chronic low grade reflux do not cough until there is a primary insult that disturbs the protective epithelial barrier and exposes  sensitive nerve endings.   This is typically viral but can due to PNDS and  either may apply here.   The point is that once this occurs, it is difficult to eliminate the cycle  using anything but a maximally effective acid suppression regimen at least in the short run, accompanied by an appropriate diet to address non acid GERD and control / eliminate the cough itself for at least 3 days and eliminate all pnds with 1st gen H1 blockers per guidelines     Will start this time with just the H1 and diet/ bed blocks and continue max acid suppression and then return for 3 days heavy cough suppression with tyl #3 / consider trial of gabapentin next.

## 2018-03-14 NOTE — Telephone Encounter (Signed)
  I was made aware of results of pH study and manometry, called patient to discuss. She has a normal Demeester score (0.4) and symptoms did NOT correlate with any reflux episodes. Based on her pH study, this would argue her reflux is well controlled and not the cause of her cough. This was a bit of a surprise given her prior endoscopic findings and her symptoms which she had felt caused her cough prior to her initial Nissen. Based off the pH study, that would argue she would not benefit from a redo of the Nissen, which she wanted to avoid if possible. Her manometry did not show any significant abnormalities.  I will touch base with her Pulmonologist and Cardiologist about her case given the complexity of her other issues, per her request. At some point in time she needs a repeat EGD and colonoscopy, although has a pending cardiac stent that needs to be placed and may be better to do that first prior to endoscopy. She verbalized understanding of the test results and issues at hand, all questions answered.

## 2018-03-14 NOTE — Assessment & Plan Note (Signed)
V/q nl  02/28/2018  - ONO RA 03/01/2018  sats ok when correlating with pulse but when no correlation (ie not pickup up pulse) sats down x 62mn total all night so likely artifact    I reviewed all the studies we completed with pt and husband emphasizing there is no one def single reversible cause for her doe which as we demonstrated today is not nearly as bad as she reported and she needs to stay active/ pace herself better and work on wt loss while we sort out what to offer here.  She is focused on her other heart findings including comments like "well what are you going to do about my 70% blockage" which I don't think is that important in the picture but could not convince her so will rec to Dr STamala Julianthat he sit down with them and go over the findings again and refer to Dr MAundra Dubinfor opinion re PLibertyvillerx.   > 50 % of this 40 min ov spent in counseling   F/u in 6 weeks with all meds in hand using a trust but verify approach to confirm accurate Medication  Reconciliation The principal here is that until we are certain that the  patients are doing what we've asked, it makes no sense to ask them to do more.

## 2018-03-14 NOTE — Telephone Encounter (Signed)
-----   Message from [name], MD sent at 03/14/2018  1:26 PM EST ----- Let her know I have spoken to Dr. Melvyn Novas and will be referring her to the aortic valve clinic to determine if we are underestimating the severity of the aortic valve in her overall condition.

## 2018-03-14 NOTE — Telephone Encounter (Signed)
Attempted to contact pt.  Phone line busy.  Will try again later.

## 2018-03-17 ENCOUNTER — Telehealth: Payer: Self-pay | Admitting: Interventional Cardiology

## 2018-03-17 DIAGNOSIS — I350 Nonrheumatic aortic (valve) stenosis: Secondary | ICD-10-CM

## 2018-03-17 DIAGNOSIS — R0600 Dyspnea, unspecified: Secondary | ICD-10-CM

## 2018-03-17 DIAGNOSIS — R0609 Other forms of dyspnea: Secondary | ICD-10-CM

## 2018-03-17 NOTE — Telephone Encounter (Signed)
Follow Up:     Pt says she is returning Dr Thompson Caul call.

## 2018-03-18 ENCOUNTER — Encounter: Payer: Self-pay | Admitting: *Deleted

## 2018-03-18 NOTE — Telephone Encounter (Signed)
Dr. Tamala Julian spoke with pt this morning.  Plan is to schedule her for a heart cath.  Called pt and husband states she had just left.  He will have her call me back when she returns.

## 2018-03-18 NOTE — Telephone Encounter (Signed)
Spoke with pt and she would like to have cath scheduled for 12/30.  Advised I would call to schedule and then call back with instructions.  Pt appreciative for call.

## 2018-03-18 NOTE — Telephone Encounter (Signed)
Spoke with pt and went over cath instructions.  Pt will come for labs on 04/14/18.  I have placed a copy of instructions in the mail.  Pt aware to call if any questions.  Pt verbalized understanding and was appreciative for call.

## 2018-03-18 NOTE — Telephone Encounter (Signed)
See phone note from 03/17/18

## 2018-03-18 NOTE — Telephone Encounter (Signed)
Patient returned your call.

## 2018-03-21 ENCOUNTER — Encounter: Payer: Self-pay | Admitting: Internal Medicine

## 2018-03-27 ENCOUNTER — Ambulatory Visit: Payer: Self-pay | Admitting: *Deleted

## 2018-03-27 ENCOUNTER — Other Ambulatory Visit: Payer: Self-pay

## 2018-03-27 DIAGNOSIS — E118 Type 2 diabetes mellitus with unspecified complications: Secondary | ICD-10-CM

## 2018-03-27 MED ORDER — "INSULIN SYRINGE-NEEDLE U-100 31G X 5/16"" 1 ML MISC": 2 refills | Status: DC

## 2018-03-27 NOTE — Telephone Encounter (Signed)
Received notification from [facility] re: pt request to refill Syringe/needles. Request authorized and refill sent as requested.

## 2018-03-27 NOTE — Telephone Encounter (Signed)
Returned call to pt.  Reported she has had increased urinary urgency and frequency over past week.  Stated her urine is "bubbly and cloudy."  Denied any difficulty in emptying her bladder.  Denied any pain/ burning with urination.  Denied fever/ chills.  Denied flank or back pain, or blood in urine. Requested to drop off a urine specimen for UA.  Recommended to schedule an appt. for evaluation.  Appt. sched. with PCP office 12/6 @ 2:30 PM.  Agreed with plan.  Care advice given per protocol.  Verb. Understanding.         Reason for Disposition . Urinating more frequently than usual (i.e., frequency)  Answer Assessment - Initial Assessment Questions 1. SYMPTOM: "What's the main symptom you're concerned about?" (e.g., frequency, incontinence)     Frequency and urgency of urination  2. ONSET: "When did the  Symptoms start?"     One week ago 3. PAIN: "Is there any pain?" If so, ask: "How bad is it?" (Scale: 1-10; mild, moderate, severe)     Denied pain  4. CAUSE: "What do you think is causing the symptoms?"    UTI  5. OTHER SYMPTOMS: "Do you have any other symptoms?" (e.g., fever, flank pain, blood in urine, pain with urination)     Denied flank pain , blood in urine, or pain with urination   6. PREGNANCY: "Is there any chance you are pregnant?" "When was your last menstrual period?"    N/a  Protocols used: URINARY [facility]

## 2018-03-27 NOTE — Telephone Encounter (Signed)
Summary: increased frequency of urination-bubbly urine    Patient is requesting call back from nurse to discuss "bubbly" urine and increased frequency of urination which she states she feels is a UTI. Patient denies any pain. Please advise.

## 2018-03-28 ENCOUNTER — Encounter: Payer: Self-pay | Admitting: Family Medicine

## 2018-03-28 ENCOUNTER — Ambulatory Visit (INDEPENDENT_AMBULATORY_CARE_PROVIDER_SITE_OTHER): Payer: Medicare Other | Admitting: Family Medicine

## 2018-03-28 ENCOUNTER — Ambulatory Visit: Payer: Medicare Other | Admitting: Family Medicine

## 2018-03-28 VITALS — BP 118/64 | HR 82 | Temp 98.2°F | Wt 208.0 lb

## 2018-03-28 DIAGNOSIS — N3001 Acute cystitis with hematuria: Secondary | ICD-10-CM

## 2018-03-28 DIAGNOSIS — R350 Frequency of micturition: Secondary | ICD-10-CM

## 2018-03-28 LAB — POC URINALSYSI DIPSTICK (AUTOMATED)
Bilirubin, UA: NEGATIVE
Glucose, UA: NEGATIVE
Ketones, UA: NEGATIVE
Nitrite, UA: NEGATIVE
Protein, UA: NEGATIVE
Spec Grav, UA: 1.015 (ref 1.010–1.025)
Urobilinogen, UA: 0.2 E.U./dL
pH, UA: 6 (ref 5.0–8.0)

## 2018-03-28 MED ORDER — SULFAMETHOXAZOLE-TRIMETHOPRIM 800-160 MG PO TABS: 1.0000 | ORAL_TABLET | Freq: Two times a day (BID) | ORAL | 0 refills | Status: AC

## 2018-03-28 NOTE — Patient Instructions (Signed)

## 2018-03-28 NOTE — Progress Notes (Signed)
Subjective:    Patient ID: [name], female    DOB: April 02, 1946, 72 y.o.   MRN: [id number]  No chief complaint on file.   HPI Patient was seen today for acute concern.  Pt endorses urinary urgency, cloudy urine, frequency x 1 wk.  Pt denies fever, chills, dysuria.  Pt states she drinks plenty of water and fluids per day.  Has not tried anything for her symptoms.  Pt mentions weight gain.  No changes in diet.  Seeing Cardiology 2/2 leaky valve and blockages in arteries.  Notes increased cough 2/2 GERD.  Denies SOB, LE edema, constipation.  Past Medical History:  Diagnosis Date  . Arthritis   . Carotid artery stenosis    Mild  . Colon polyps   . Diabetes mellitus   . Diverticulitis   . Diverticulosis   . GERD (gastroesophageal reflux disease)   . Grave's disease   . Heart murmur    mitral prolapse  . History of colonic polyps 05/22/2017  . Hyperlipidemia   . Hypertension   . IBS (irritable bowel syndrome)   . Osteopenia   . Thalassemia minor     Allergies  Allergen Reactions  . Statins Other (See Comments)    Muscle aches    ROS General: Denies fever, chills, night sweats, changes in appetite  +weight gain HEENT: Denies headaches, ear pain, changes in vision, rhinorrhea, sore throat CV: Denies CP, palpitations, SOB, orthopnea Pulm: Denies SOB, cough, wheezing GI: Denies abdominal pain, nausea, vomiting, diarrhea, constipation GU: Denies dysuria, hematuria, vaginal discharge  +frequency and urgency, cloudy urine Msk: Denies muscle cramps, joint pains Neuro: Denies weakness, numbness, tingling Skin: Denies rashes, bruising Psych: Denies depression, anxiety, hallucinations    Objective:    Blood pressure 118/64, pulse 82, temperature 98.2 F (36.8 C), temperature source Oral, weight 208 lb (94.3 kg), SpO2 95 %.  Gen. Pleasant, well-nourished, in no distress, normal affect   HEENT: [place]/AT, face symmetric, no scleral icterus, PERRLA, nares patent without  drainage Lungs: no accessory muscle use, CTAB, no wheezes or rales Cardiovascular: RRR, 3/6 murmur,  no peripheral edema Abdomen: BS present, soft, NT/ND Neuro:  A&Ox3, CN II-XII intact, normal gait  Wt Readings from Last 3 Encounters:  03/13/18 202 lb (91.6 kg)  02/20/18 200 lb (90.7 kg)  02/19/18 203 lb 1.9 oz (92.1 kg)    Lab Results  Component Value Date   WBC 8.7 02/19/2018   HGB 11.5 02/19/2018   HCT 35.3 02/19/2018   PLT 286 02/19/2018   GLUCOSE 162 (H) 02/19/2018   CHOL 159 06/14/2017   TRIG 123.0 06/14/2017   HDL 41.20 06/14/2017   LDLDIRECT 98.0 05/24/2014   LDLCALC 93 06/14/2017   ALT 32 06/14/2017   AST 26 06/14/2017   NA 136 02/19/2018   K 4.5 02/19/2018   CL 103 02/19/2018   CREATININE 0.76 02/19/2018   BUN 15 02/19/2018   CO2 25 02/19/2018   TSH 0.79 08/05/2017   HGBA1C 9.4 (A) 02/04/2018   MICROALBUR 0.8 10/07/2015    Assessment/Plan:  Acute cystitis with hematuria -Given handout -We will send for urine culture  - Plan: Urine Culture, sulfamethoxazole-trimethoprim (BACTRIM DS,SEPTRA DS) 800-160 MG tablet -Given RTC or ED precautions  Urinary frequency  - Plan: POCT Urinalysis Dipstick (Automated) -UA with SG 1.015, + erythrocytes, 2+ leuks   Follow-up PRN with pcp  Grier Mitts, MD

## 2018-03-30 LAB — URINE CULTURE
MICRO NUMBER:: 91463883
SPECIMEN QUALITY:: ADEQUATE

## 2018-03-31 ENCOUNTER — Other Ambulatory Visit: Payer: Self-pay | Admitting: Adult Health

## 2018-04-01 NOTE — Telephone Encounter (Signed)
Denied.  Pt is no longer taking.

## 2018-04-02 DIAGNOSIS — Z124 Encounter for screening for malignant neoplasm of cervix: Secondary | ICD-10-CM | POA: Diagnosis not present

## 2018-04-02 DIAGNOSIS — Z01419 Encounter for gynecological examination (general) (routine) without abnormal findings: Secondary | ICD-10-CM | POA: Diagnosis not present

## 2018-04-04 DIAGNOSIS — E119 Type 2 diabetes mellitus without complications: Secondary | ICD-10-CM | POA: Diagnosis not present

## 2018-04-07 ENCOUNTER — Other Ambulatory Visit: Payer: Self-pay | Admitting: Adult Health

## 2018-04-07 ENCOUNTER — Telehealth: Payer: Self-pay | Admitting: Adult Health

## 2018-04-07 ENCOUNTER — Other Ambulatory Visit: Payer: Self-pay | Admitting: Internal Medicine

## 2018-04-07 ENCOUNTER — Ambulatory Visit (INDEPENDENT_AMBULATORY_CARE_PROVIDER_SITE_OTHER): Payer: Medicare Other | Admitting: Endocrinology

## 2018-04-07 ENCOUNTER — Encounter: Payer: Self-pay | Admitting: Endocrinology

## 2018-04-07 VITALS — BP 124/68 | HR 76 | Ht 65.0 in | Wt 203.8 lb

## 2018-04-07 DIAGNOSIS — E118 Type 2 diabetes mellitus with unspecified complications: Secondary | ICD-10-CM

## 2018-04-07 DIAGNOSIS — E1159 Type 2 diabetes mellitus with other circulatory complications: Secondary | ICD-10-CM | POA: Diagnosis not present

## 2018-04-07 DIAGNOSIS — E11649 Type 2 diabetes mellitus with hypoglycemia without coma: Secondary | ICD-10-CM

## 2018-04-07 DIAGNOSIS — M545 Low back pain: Secondary | ICD-10-CM | POA: Diagnosis not present

## 2018-04-07 DIAGNOSIS — I272 Pulmonary hypertension, unspecified: Secondary | ICD-10-CM

## 2018-04-07 LAB — POCT GLYCOSYLATED HEMOGLOBIN (HGB A1C): Hemoglobin A1C: 7.4 % — AB (ref 4.0–5.6)

## 2018-04-07 NOTE — Progress Notes (Signed)
Subjective:    Patient ID: Breanna Webster, female    DOB: 10/24/1945, 72 y.o.   MRN: [id number]  HPI Pt returns for f/u of diabetes mellitus: DM type: Insulin-requiring type 2 Dx'ed: 2003.  Complications: CAD Therapy: insulin since 2017.     GDM: never.   DKA: never.  Severe hypoglycemia: never.  Pancreatitis: never.   Other: she stopped metformin-XR due to nausea and diarrhea; pioglitizone is precluded by edema.  she takes human insulin, due to cost; she declines multiple daily injections.  Interval history: she has had several injections into the lower back.  She has mild hypoglycemia is a meal is delayed.  no cbg record, but states cbg's are well-controlled Past Medical History:  Diagnosis Date  . Arthritis   . Carotid artery stenosis    Mild  . Colon polyps   . Diabetes mellitus   . Diverticulitis   . Diverticulosis   . GERD (gastroesophageal reflux disease)   . Grave's disease   . Heart murmur    mitral prolapse  . History of colonic polyps 05/22/2017  . Hyperlipidemia   . Hypertension   . IBS (irritable bowel syndrome)   . Osteopenia   . Thalassemia minor     Past Surgical History:  Procedure Laterality Date  . [place] STUDY N/A 03/03/2018   Procedure: [place] STUDY;  Surgeon: Mauri Pole, MD;  Location: [facility];  Service: Endoscopy;  Laterality: N/A;  . CHOLECYSTECTOMY    . ESOPHAGEAL MANOMETRY N/A 03/03/2018   Procedure: ESOPHAGEAL MANOMETRY (EM);  Surgeon: Mauri Pole, MD;  Location: [facility];  Service: Endoscopy;  Laterality: N/A;  . HIATAL HERNIA REPAIR    . HYSTEROSCOPY     fibroids  . laparoscopy     fibroids  . RIGHT/LEFT HEART CATH AND CORONARY ANGIOGRAPHY N/A 02/20/2018   Procedure: RIGHT/LEFT HEART CATH AND CORONARY ANGIOGRAPHY;  Surgeon: [name], MD;  Location: [facility];  Service: Cardiovascular;  Laterality: N/A;    Social History   Socioeconomic History  . Marital status: Married    Spouse  name: Not on file  . Number of children: 2  . Years of education: Not on file  . Highest education level: Not on file  Occupational History  . Occupation: Retired  Scientific laboratory technician  . Financial resource strain: Not on file  . Food insecurity:    Worry: Not on file    Inability: Not on file  . Transportation needs:    Medical: Not on file    Non-medical: Not on file  Tobacco Use  . Smoking status: Never Smoker  . Smokeless tobacco: Never Used  Substance and Sexual Activity  . Alcohol use: No    Alcohol/week: 0.0 standard drinks  . Drug use: No  . Sexual activity: Not on file  Lifestyle  . Physical activity:    Days per week: Not on file    Minutes per session: Not on file  . Stress: Not on file  Relationships  . Social connections:    Talks on phone: Not on file    Gets together: Not on file    Attends religious service: Not on file    Active member of club or organization: Not on file    Attends meetings of clubs or organizations: Not on file    Relationship status: Not on file  . Intimate partner violence:    Fear of current or ex partner: Not on file  Emotionally abused: Not on file    Physically abused: Not on file    Forced sexual activity: Not on file  Other Topics Concern  . Not on file  Social History Narrative   Lives with husband in a one story home.     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   Education: college.    Current Outpatient Medications on File Prior to Visit  Medication Sig Dispense Refill  . aspirin 81 MG tablet Take 81 mg by mouth daily.      . bisoprolol (ZEBETA) 5 MG tablet Take 1 tablet (5 mg total) by mouth daily. 90 tablet 1  . cholecalciferol (VITAMIN D) 1000 units tablet Take 1,000 Units by mouth daily.    Marland Kitchen escitalopram (LEXAPRO) 20 MG tablet TAKE 1 TABLET BY MOUTH ONCE DAILY 90 tablet 2  . hydrochlorothiazide (HYDRODIURIL) 12.5 MG tablet TAKE 1 TABLET(12.5 MG) BY  MOUTH DAILY (Patient taking differently: Take 12.5 mg by mouth daily. ) 90 tablet 3  . insulin NPH Human (NOVOLIN N RELION) 100 UNIT/ML injection Inject 1.2 mLs (120 Units total) into the skin every morning. And syringes 1/day 40 mL 11  . Insulin Syringe-Needle U-100 (BD INSULIN SYRINGE U/F) 31G X 5/16" 1 ML MISC USE TO INJECT INSULIN ONCE DAILY 90 each 2  . losartan (COZAAR) 50 MG tablet Take 1 tablet (50 mg total) by mouth daily. 90 tablet 1  . Multiple Vitamin (MULITIVITAMIN WITH MINERALS) TABS Take 1 tablet by mouth daily.    [name] DELICA LANCETS 56E MISC USE TO CHECK BLOOD SUGAR TWICE DAILY 200 each 0  . ONETOUCH VERIO test strip USE TO CHECK BLOOD SUGAR TWICE DAILY 200 each 0  . pantoprazole (PROTONIX) 40 MG tablet Take 1 tablet (40 mg total) by mouth 2 (two) times daily. 60 tablet 2  . SYNTHROID 175 MCG tablet TAKE 1 TABLET BY MOUTH ONCE DAILY. 90 tablet 0  . vitamin E 400 UNIT capsule Take 400 Units by mouth daily.     No current facility-administered medications on file prior to visit.     Allergies  Allergen Reactions  . Statins Other (See Comments)    Muscle aches    Family History  Adopted: Yes  Problem Relation Age of Onset  . Healthy Son        x 2  . Headache Unknown        Cluster headaches  . Colon cancer Neg Hx   . Pancreatic cancer Neg Hx   . Rectal cancer Neg Hx   . Stomach cancer Neg Hx     BP 124/68 (BP Location: Right Arm, Patient Position: Sitting, Cuff Size: Normal)   Pulse 76   Ht 5' 5"  (1.651 m)   Wt 203 lb 12.8 oz (92.4 kg)   SpO2 96%   BMI 33.91 kg/m    Review of Systems She has weight gain.        Objective:   Physical Exam VITAL SIGNS:  See vs page GENERAL: no distress Pulses: foot pulses are intact bilaterally.   MSK: no deformity of the feet or ankles.  CV: no edema of the legs or ankles Skin:  no ulcer on the feet or ankles.  normal color and temp on the feet and ankles.  Neuro: sensation is intact to touch on the feet and  ankles.   Ext: There is bilateral onychomycosis of the toenails.    Lab  Results  Component Value Date   HGBA1C 7.4 (A) 04/07/2018   Lab Results  Component Value Date   TSH 0.79 08/05/2017   T3TOTAL 89 02/26/2017   Lab Results  Component Value Date   CREATININE 0.76 02/19/2018   BUN 15 02/19/2018   NA 136 02/19/2018   K 4.5 02/19/2018   CL 103 02/19/2018   CO2 25 02/19/2018      Assessment & Plan:  Insulin-requiring type 2 DM, with CAD: well-controlled. Hypoglycemia, persistent. Low back pain: this is affecting a1c.     Patient Instructions  Please continue the same insulin.   After each of the steroid shots, please check to see if the blood sugar goes higher.  If it is over 200, please call us check your blood sugar twice a day.  vary the time of day when you check, between before the 3 meals, and at bedtime.  also check if you have symptoms of your blood sugar being too high or too low.  please keep a record of the readings and bring it to your next appointment here (or you can bring the meter itself).  You can write it on any piece of paper.  please call us sooner if your blood sugar goes below 70, or if you have a lot of readings over 200.   Please come back for a follow-up appointment in 3 months.

## 2018-04-07 NOTE — Telephone Encounter (Signed)
Denied.  Filled on 11/27/17 for 6 months.  Request is too early.

## 2018-04-07 NOTE — Telephone Encounter (Signed)
Filled on 12/2017 for 6 months.  Request is early.

## 2018-04-07 NOTE — Telephone Encounter (Signed)
Copied from [place] [id number]. Topic: Quick Communication - Rx Refill/Question >> Apr 07, 2018 12:42 PM Blase Mess A wrote: Medication: losartan (COZAAR) 50 MG tablet [[id number]] , bisoprolol (ZEBETA) 5 MG tablet [[id number]] * 8/19 *9/19 Prescription were lost per the pharmacy  Has the patient contacted their pharmacy? Yes  (Agent: If no, request that the patient contact the pharmacy for the refill.) (Agent: If yes, when and what did the pharmacy advise?)  Preferred Pharmacy (with phone number or street name): [facility] - [place], [address] [place] [address] Phone: [phone] Fax: [phone]    Agent: Please be advised that RX refills may take up to 3 business days. We ask that you follow-up with your pharmacy.

## 2018-04-07 NOTE — Telephone Encounter (Signed)
Denied.  Not filled since 2017.

## 2018-04-07 NOTE — Patient Instructions (Addendum)
Please continue the same insulin.   After each of the steroid shots, please check to see if the blood sugar goes higher.  If it is over 200, please call us check your blood sugar twice a day.  vary the time of day when you check, between before the 3 meals, and at bedtime.  also check if you have symptoms of your blood sugar being too high or too low.  please keep a record of the readings and bring it to your next appointment here (or you can bring the meter itself).  You can write it on any piece of paper.  please call us sooner if your blood sugar goes below 70, or if you have a lot of readings over 200.   Please come back for a follow-up appointment in 3 months.

## 2018-04-08 ENCOUNTER — Encounter: Payer: Self-pay | Admitting: Internal Medicine

## 2018-04-08 ENCOUNTER — Ambulatory Visit (INDEPENDENT_AMBULATORY_CARE_PROVIDER_SITE_OTHER): Payer: Medicare Other | Admitting: Internal Medicine

## 2018-04-08 VITALS — BP 114/70 | HR 71 | Ht 65.0 in | Wt 204.0 lb

## 2018-04-08 DIAGNOSIS — R05 Cough: Secondary | ICD-10-CM | POA: Diagnosis not present

## 2018-04-08 DIAGNOSIS — R0600 Dyspnea, unspecified: Secondary | ICD-10-CM

## 2018-04-08 DIAGNOSIS — I272 Pulmonary hypertension, unspecified: Secondary | ICD-10-CM | POA: Diagnosis not present

## 2018-04-08 DIAGNOSIS — R0609 Other forms of dyspnea: Secondary | ICD-10-CM

## 2018-04-08 DIAGNOSIS — R058 Other specified cough: Secondary | ICD-10-CM

## 2018-04-08 LAB — PULMONARY FUNCTION TEST
DL/VA % pred: 76 %
DL/VA: 3.74 ml/min/mmHg/L
DLCO unc % pred: 52 %
DLCO unc: 13.34 ml/min/mmHg
FEF 25-75 Post: 1.98 L/sec
FEF 25-75 Pre: 1.88 L/sec
FEF2575-%Change-Post: 5 %
FEF2575-%Pred-Post: 112 %
FEF2575-%Pred-Pre: 106 %
FEV1-%Change-Post: 1 %
FEV1-%Pred-Post: 89 %
FEV1-%Pred-Pre: 88 %
FEV1-Post: 2 L
FEV1-Pre: 1.98 L
FEV1FVC-%Change-Post: 4 %
FEV1FVC-%Pred-Pre: 105 %
FEV6-%Change-Post: -3 %
FEV6-%Pred-Post: 85 %
FEV6-%Pred-Pre: 88 %
FEV6-Post: 2.42 L
FEV6-Pre: 2.5 L
FEV6FVC-%Change-Post: 0 %
FEV6FVC-%Pred-Post: 104 %
FEV6FVC-%Pred-Pre: 104 %
FVC-%Change-Post: -2 %
FVC-%Pred-Post: 81 %
FVC-%Pred-Pre: 84 %
FVC-Post: 2.44 L
FVC-Pre: 2.5 L
Post FEV1/FVC ratio: 82 %
Post FEV6/FVC ratio: 100 %
Pre FEV1/FVC ratio: 79 %
Pre FEV6/FVC Ratio: 100 %
RV % pred: 58 %
RV: 1.35 L
TLC % pred: 75 %
TLC: 3.94 L

## 2018-04-08 NOTE — Progress Notes (Signed)
PFT done today. 

## 2018-04-08 NOTE — Progress Notes (Signed)
Subjective:     Patient ID: Breanna Webster, female   DOB: December 22, 1945     MRN: [id number]    Brief patient profile:  26  yowf never smoker with pnds as child allergy tested as child doesn't know details, never took shots,  outgrew completely then symptoms resumed in 90's after moved to [place] from [facility] with coughing that improved p NF late 90s  but still had drainage variably  severe no pattern as to time of year then much worse since summer 2016 with onset of cough/wheeze May 2017 referred to pulmonary clinic 10/14/2015 by Dr Betty [name].      History of Present Illness  10/14/2015 1st [facility] office visit/ Breanna Webster   Chief Complaint  Patient presents with  . Pulmonary Consult    Referred by Dr. Betty [name]. Pt c/o cough and wheezing x 5 wks. Cough is occ prod with minimal green sputum.   acute onset cough > green mucus one week p husband's uri May 2017 >  rx with abx/ prednisone improved then worse 2 days p finished but mostly productive  white mucus.  If doesn't take the codeine cough med then coughs worse at hs.  No assoc sob/ some wheeze better with saba  rec The key to effective treatment for your cough is eliminating the non-stop cycle of cough you're stuck in long enough to let your airway heal completely and then see if there is anything still making you cough once you stop the cough suppression, but this should take no more than 5 days to figure out Eliminate all coughing for 3 days with your codiene syrup and then switch delsym 2 tsp every 12 hours  Prednisone 10 mg take  4 each am x 2 days,   2 each am x 2 days,  1 each am x 2 days and stop (this is to eliminate allergies and inflammation from coughing) Protonix (pantoprazole) Take 30-60 min before first meal of the day and Pepcid 20 mg one bedtime plus chlorpheniramine 4 mg x 2 at bedtime (both available over the counter)  until cough is completely gone for at least a week without the need for cough suppression GERD  diet    11/02/2015 acute extended ov/Breanna Webster re: cough since may 2017   Chief Complaint  Patient presents with  . Pulmonary Consult    Cough has only improved some. She is still wheezing and her cough is prod with green sputum.    worse in am and at bedtime and slt green mucus again p initially turning white  rec Please see patient coordinator before you leave today  to schedule sinus CT and we will call results  For drainage / throat tickle try take CHLORPHENIRAMINE  4 mg - take one every 4 hours as needed -  Please remember to go to the lab  department downstairs for your tests - we will call you with the results when they are available. Until cough gone 100% with no need for any cough medicine > prilosec or pantoprazole 40 mg proton pump inhibitor and continue pepcid 20 mg at hs Take delsym two tsp every 12 hours and supplement if needed with tylenol #3  up to 2 every 4 hours to suppress the urge to cough.  Once you have eliminated the cough for 3 straight days try reducing the tylenol #3  first,  then the delsym as tolerated.   Stop toprol (lopressor/metaprolol)  And start bisoprolol 5 mg one daily  12/13/2015  f/u ov/Breanna Webster re: cyclical cough/ gerd Chief Complaint  Patient presents with  . Follow-up    Cough has resolved completely and no more wheezing for the past month. No new co's.    no cough at all/ on no gerd meds/ on bisoprolol instead of metaprolol and doing great  rec Remember at the onset of any respiratory complaint :  Try prilosec otc 73m  Take 30-60 min before first meal of the day and Pepcid ac (famotidine) 20 mg one @  bedtime until completely better for a week For drainage / throat tickle try take CHLORPHENIRAMINE  4 mg - take one every 4 hours as needed -          07/19/2016 acute extended ov/Breanna Webster re: sob no longer on gerd rx "I don't have any HB"  Chief Complaint  Patient presents with  . Acute Visit    Increased SOB x 3 months. She states that she gets  SOB with any exertion at all.    new problem =  Sob with exertion lifelong always the last one off the playground and trouble with steps and hills as long as she can recall Baseline = walking up to 45 min flat at [employer] [facility] s stopping  and 30 min on bicycle at "moderate pace and resistance"  Took some time off due to back pain and now can't walk 5 min with back pain and sob with back pain so severe today in w/c and pain shooting down R leg to foot s/p failed injections so far Never on inhalers  rec Nexium 40 (or prilosec 20 x 2) Take 30- 60 min before your first and last meals of the day  GERD  Diet     03/20/17 EGD Breanna Webster:  Loose NF, Barrett's    11/11/2017  Extended f/u ov/Breanna Webster re: doe/ recurrent cough on gerd rx  Chief Complaint  Patient presents with  . Follow-up    Pt states she still has the cough. Pt states she will have spasms that will bring on her cough. Pt also has complaints of SOB with exertion and has chest tightness.  Dyspnea:  MMRC3 = can't walk 100 yards even at a slow pace at a flat grade s stopping due to sob  - only able to shop by pushing cart around "anywhere" but with freq stops  Cough: dry sporadic daytime / eating and talking seem to trigger it mostly  rec  To get the most out of exercise, you need to be continuously aware that you are short of breath, but never out of breath, for 30 minutes daily. As you improve, it will actually be easier for you to do the same amount of exercise  in  30 minutes so always push to the level where you are short of breath.    We will arrange a cpst in one month no sooner and I will call you with the results needs repeat echo prior to  CPST >  Pos AS     W/u for AS  02/20/18 LHC/RHC  Mean gradient  18 with wedge 18  and mod PH wih PVR 294 > ONO RA ok/ v/q nl     03/13/2018  Extended  f/u ov/Breanna Webster re: doe/ chronic cough? ? [place]  Chief Complaint  Patient presents with  . Follow-up    Cough still present and  getting worse - productive clear most of the time.   Dyspnea:  One half block then  has to stop due to cough and sob  Cough: daytime / variable during the day,  Up to a tbsp clear/ gag but no vomit Sleeping: flat with 2 pillows  SABA use: none 02: no  Records show best response was to cyclical cough regimen which has never been repeated since 11/02/15  rec If you are asked to stop the aspirin for any reason check with Dr Tamala Julian There is no pulmonary reason you cannot have dental surgery at this point For drainage / throat tickle try take CHLORPHENIRAMINE  4 mg (chlortabs 4 mg from [facility]) - take one every 4 hours as needed - available over the counter- may cause drowsiness so start with just a bedtime dose or two and see how you tolerate it before trying in daytime. GERD (REFLUX)   Please schedule a follow up office visit in 6 weeks, call sooner if needed with all medications /inhalers/ solutions in hand so we can verify exactly what you are taking. This includes all medications from all doctors and over the counters  - add full pfts on return - move up to 2 weeks and add tyl #3 in meantime if still can't stop coughing   04/08/2018  f/u ov/Destina Mantei re:  Doe  Chief Complaint  Patient presents with  . Follow-up    PFT done today. Her breathing has improved and she is coughing less but is not back to her normal baseline.   Dyspnea:  Maybe a block Cough: much less / sleeping fine now Sleeping: hob 5 in / doing well  SABA use: none 02: none     No obvious day to day or daytime variability or assoc excess/ purulent sputum or mucus plugs or hemoptysis or cp or chest tightness, subjective wheeze or overt sinus or hb symptoms.   Sleeping as above  without nocturnal  or early am exacerbation  of respiratory  c/o's or need for noct saba. Also denies any obvious fluctuation of symptoms with weather or environmental changes or other aggravating or alleviating factors except as outlined above   No  unusual exposure hx or h/o childhood pna/ asthma or knowledge of premature birth.  Current Allergies, Complete Past Medical History, Past Surgical History, Family History, and Social History were reviewed in [employer] record.  ROS  The following are not active complaints unless bolded Hoarseness, sore throat, dysphagia, dental problems, itching, sneezing,  nasal congestion or discharge of excess mucus or purulent secretions, ear ache,   fever, chills, sweats, unintended wt loss or wt gain, classically pleuritic or exertional cp,  orthopnea pnd or arm/hand swelling  or leg swelling, presyncope, palpitations, abdominal pain, anorexia, nausea, vomiting, diarrhea  or change in bowel habits or change in bladder habits, change in stools or change in urine, dysuria, hematuria,  rash, arthralgias, visual complaints, headache, numbness, weakness or ataxia or problems with walking or coordination,  change in mood or  memory.        Current Meds  Medication Sig  . aspirin 81 MG tablet Take 81 mg by mouth daily.    . bisoprolol (ZEBETA) 5 MG tablet Take 1 tablet (5 mg total) by mouth daily.  . chlorpheniramine (CHLOR-TRIMETON) 4 MG tablet Take 4 mg by mouth every 4 (four) hours as needed for allergies.  . cholecalciferol (VITAMIN D) 1000 units tablet Take 1,000 Units by mouth daily.  Marland Kitchen escitalopram (LEXAPRO) 20 MG tablet TAKE 1 TABLET BY MOUTH ONCE DAILY  . hydrochlorothiazide (HYDRODIURIL) 12.5 MG tablet TAKE  1 TABLET(12.5 MG) BY MOUTH DAILY (Patient taking differently: Take 12.5 mg by mouth daily. )  . insulin NPH Human (NOVOLIN N RELION) 100 UNIT/ML injection Inject 1.2 mLs (120 Units total) into the skin every morning. And syringes 1/day  . Insulin Syringe-Needle U-100 (BD INSULIN SYRINGE U/F) 31G X 5/16" 1 ML MISC USE TO INJECT INSULIN ONCE DAILY  . losartan (COZAAR) 50 MG tablet Take 1 tablet (50 mg total) by mouth daily.  . Multiple Vitamin (MULITIVITAMIN WITH MINERALS) TABS  Take 1 tablet by mouth daily.  . nortriptyline (PAMELOR) 25 MG capsule Take 25 mg by mouth at bedtime.  [name] DELICA LANCETS 33A MISC USE TO CHECK BLOOD SUGAR TWICE DAILY  . ONETOUCH VERIO test strip USE TO CHECK BLOOD SUGAR TWICE DAILY  . pantoprazole (PROTONIX) 40 MG tablet Take 1 tablet (40 mg total) by mouth 2 (two) times daily.  Marland Kitchen SYNTHROID 175 MCG tablet TAKE 1 TABLET BY MOUTH ONCE DAILY.  . vitamin E 400 UNIT capsule Take 400 Units by mouth daily.             Objective:   Physical Exam    amb obese wf nad    04/08/2018      204  03/13/2018      202  11/11/2017        204  07/19/2016        196 per pt  12/13/2015       193   11/02/2015       193  10/14/15 192 lb (87.091 kg)  10/10/15 190 lb (86.183 kg)  10/07/15 189 lb 4 oz (85.843 kg)    Vital signs reviewed - Note on arrival 02 sats  95 % on RA     HEENT: nl dentition, turbinates bilaterally, and oropharynx. Nl external ear canals without cough reflex   NECK :  without JVD/Nodes/TM/ nl carotid upstrokes bilaterally   LUNGS: no acc muscle use,  Nl contour chest which is clear to A and P bilaterally with  cough on deep forceful  insp  maneuvers   CV:  RRR  no s3  II-III/VI sem slt increase in P2, and no edema   ABD:  Obese/ soft and nontender with nl inspiratory excursion in the supine position. No bruits or organomegaly appreciated, bowel sounds nl  MS:  Nl gait/ ext warm without deformities, calf tenderness, cyanosis or clubbing No obvious joint restrictions   SKIN: warm and dry without lesions    NEURO:  alert, approp, nl sensorium with  no motor or cerebellar deficits apparent.           Assessment:

## 2018-04-08 NOTE — Patient Instructions (Signed)
Pantoprazole should be Take 30- 60 min before your first and last meals of the day   Pulmonary follow up is as needed  - if you do need to return please bring all  mediations with you

## 2018-04-08 NOTE — Telephone Encounter (Signed)
Spoke to the pharmacy and was advised that the pt has called back and stated that she did not need refills.  She has found her prescriptions.  Nothing further needed at this time.

## 2018-04-09 ENCOUNTER — Ambulatory Visit: Payer: Self-pay

## 2018-04-09 ENCOUNTER — Encounter: Payer: Self-pay | Admitting: Internal Medicine

## 2018-04-09 NOTE — Telephone Encounter (Signed)
Noted  

## 2018-04-09 NOTE — Telephone Encounter (Signed)
Returned call to patient who states that her cystitis symptoms have come back. She states that on 12/6 she was given 5 day dose of Bactrim DS, Septra DS. Her symptoms went away but returned two days ago. She states her urine is cloudy.  She denies pain or odor.  She denies fever. Appointment scheduled per protocol. Care advice read to patient. Pt verbalized understanding of all instructions.  Reason for Disposition . All other urine symptoms  Answer Assessment - Initial Assessment Questions 1. SYMPTOM: "What's the main symptom you're concerned about?" (e.g., frequency, incontinence)     coudy urine 2. ONSET: "When did the  cloudy  start?"     2 days ago 3. PAIN: "Is there any pain?" If so, ask: "How bad is it?" (Scale: 1-10; mild, moderate, severe)     no 4. CAUSE: "What do you think is causing the symptoms?"     Cystitis 5. OTHER SYMPTOMS: "Do you have any other symptoms?" (e.g., fever, flank pain, blood in urine, pain with urination)     no 6. PREGNANCY: "Is there any chance you are pregnant?" "When was your last menstrual period?"     N/A  Protocols used: URINARY [facility]

## 2018-04-09 NOTE — Assessment & Plan Note (Addendum)
Cyclical cough rx 3/50/0938 >>> repeat 11/03/2015 as did not follow instructions the first time > resolved  - FENO 11/02/2015  =   6 - Allergy profile 11/02/2015 >  Eos 0.1 /  IgE  137 dust only  - Sinus CT 11/09/2015 > Mucosal thickening inferior right maxillary antrum. Visualized paranasal sinuses elsewhere clear. - 12/13/2015 all better off all meds p cyclical cough rx/ gerd rx  - Allergy profile 07/19/16 >  Eos 0.2 /  IgE  192 RAST pos dust only 03/20/17 EGD [name]:  Loose NF, Barrett's   - 03/03/18 nl manometry and Ph impedence studies    Still has cough on forceful  insp typical of uacs but can also be seen in early ILD which we have not excluded but strongly doubt   rec max rx for gerd which has included the bed blocks that are helping the noct cough component.   >>> f/u can be after cards intervention if ex tol no better.    I had an extended summary final  discussion with the patient/husband reviewing all relevant studies completed to date and  lasting 15 to 20 minutes of a 25 minute visit    Each maintenance medication was reviewed in detail including most importantly the difference between maintenance and prns and under what circumstances the prns are to be triggered using an action plan format that is not reflected in the computer generated alphabetically organized AVS.     Please see AVS for specific instructions unique to this visit that I personally wrote and verbalized to the the pt in detail and then reviewed with pt  by my nurse highlighting any  changes in therapy recommended at today's visit to their plan of care.

## 2018-04-09 NOTE — Assessment & Plan Note (Signed)
Echo 12/14/15 Left ventricle: The cavity size was normal. Systolic function was   normal. The estimated ejection fraction was in the range of 60%   to 65%. Wall motion was normal; there were no regional wall   motion abnormalities. Doppler parameters are consistent with   abnormal left ventricular relaxation (grade 1 diastolic   dysfunction). - Aortic valve: Trileaflet; moderately calcified leaflets. There   was mild stenosis. There was mild regurgitation. Mean gradient   (S): 15 mm Hg. Peak gradient (S): 29 mm Hg. - Mitral valve: Moderately calcified annulus. Mildly calcified   leaflets . There was no significant regurgitation. - Left atrium: The atrium was mildly to moderately dilated. - Right ventricle: The cavity size was normal. Systolic function   was normal. Myoview  3/19/ 2018  Nl EF/ no CAD - Spirometry 07/19/2016   FEV1 1.96 (85%)  Ratio 94  rec restart gerd rx/ treat back pain  -  Spirometry 11/11/2017  FEV1 1.76 (78%)  Ratio 75  slt abn f/v contour  -- 11/11/2017  Walked RA x 3 laps @ 185 ft each stopped due to  End of study, fast pace, no  desat / stopped once due to sob    - echo 12/09/2017  - Procedure narrative: Transthoracic echocardiography. Image quality was suboptimal. The study was technically difficult, as a result of poor acoustic windows, poor sound wave transmission, and body habitus. Intravenous contrast (Definity) was administered. - Left ventricle: The cavity size was normal. Systolic function was vigorous. The estimated ejection fraction was in the range of 65% to 70%. Wall motion was normal; there were no regional wall motion abnormalities. Doppler parameters are consistent with abnormal left ventricular relaxation (grade 1 diastolic dysfunction). - Aortic valve: Trileaflet; moderately thickened, moderately calcified leaflets. Valve mobility was restricted. There was moderate stenosis. Peak velocity (S): 346 cm/s. Mean gradient (S):  26 mm Hg. Peak gradient (S): 48 mm Hg. Valve area (VTI): 1.19 cm  - Mitral valve: Severely calcified annulus. Valve area by pressure half-time: 2.44 cm  - Left atrium: The atrium was mildly dilated  - RHC 02/20/18  PA mean = 41 with wedge 18 / CO 6.24  So PVR = 294 (nl < 250)  V/q lung scan neg for PE and not suggestive of PH  - 03/13/2018   Walked RA  2 laps @210  ft each stopped due to end of study, mod fast pace, min sob, no desats, started coughing at end PFT's  04/08/2018  FEV1 2.00 (89 % ) ratio 82  p 1 % improvement from saba p nothing  prior to study with DLCO  52 % corrects to 76  % for alv volume  And no significant curvature    There is not pulmonary explanation for her doe and chest tightness so best bet is combination of non-acid gerd and / or IHD and support going ahead with stenting her CAD if no reason from GI perspective to be on anticoagulation required for intervention / stent   Discussed in detail all the  indications, usual  risks and alternatives  relative to the benefits with patient who agrees to proceed with w/u and rx as outlined.

## 2018-04-09 NOTE — Telephone Encounter (Signed)
FYI, appt scheduled 04/10/18

## 2018-04-10 ENCOUNTER — Ambulatory Visit (INDEPENDENT_AMBULATORY_CARE_PROVIDER_SITE_OTHER): Payer: Medicare Other | Admitting: Family Medicine

## 2018-04-10 ENCOUNTER — Other Ambulatory Visit: Payer: Self-pay | Admitting: Adult Health

## 2018-04-10 ENCOUNTER — Encounter: Payer: Self-pay | Admitting: Family Medicine

## 2018-04-10 VITALS — BP 102/64 | HR 88 | Temp 98.8°F | Wt 206.0 lb

## 2018-04-10 DIAGNOSIS — R8290 Unspecified abnormal findings in urine: Secondary | ICD-10-CM

## 2018-04-10 DIAGNOSIS — N3000 Acute cystitis without hematuria: Secondary | ICD-10-CM

## 2018-04-10 DIAGNOSIS — R350 Frequency of micturition: Secondary | ICD-10-CM | POA: Diagnosis not present

## 2018-04-10 LAB — POCT URINALYSIS DIPSTICK
Bilirubin, UA: NEGATIVE
Blood, UA: NEGATIVE
Glucose, UA: NEGATIVE
Ketones, UA: NEGATIVE
Nitrite, UA: POSITIVE
Protein, UA: NEGATIVE
Spec Grav, UA: 1.015 (ref 1.010–1.025)
Urobilinogen, UA: 0.2 E.U./dL
pH, UA: 6 (ref 5.0–8.0)

## 2018-04-10 MED ORDER — CIPROFLOXACIN HCL 500 MG PO TABS: 500.0000 mg | ORAL_TABLET | Freq: Two times a day (BID) | ORAL | 0 refills | Status: AC

## 2018-04-10 NOTE — Progress Notes (Signed)
Subjective:    Patient ID: Breanna Webster, female    DOB: August 07, 1945, 72 y.o.   MRN: [id number]  No chief complaint on file.   HPI Patient was seen today for acute concern, typically seen by [name], NP.  Pt was seen on 12/6 for UTI, given bactrim.  Pt notes her symptoms went away for 3 days, but then her urine became cloudy again.  Pt denies dysuria, frequency, hematuria, back pain, fever, nausea, vomiting.  Pt has only been drinking 1-2 bottles of water per day.  Of note pt states she never received her urine culture results.  Pt remineded that she was called with results per chart review.  Past Medical History:  Diagnosis Date  . Arthritis   . Carotid artery stenosis    Mild  . Colon polyps   . Diabetes mellitus   . Diverticulitis   . Diverticulosis   . GERD (gastroesophageal reflux disease)   . Grave's disease   . Heart murmur    mitral prolapse  . History of colonic polyps 05/22/2017  . Hyperlipidemia   . Hypertension   . IBS (irritable bowel syndrome)   . Osteopenia   . Thalassemia minor     Allergies  Allergen Reactions  . Statins Other (See Comments)    Muscle aches    ROS General: Denies fever, chills, night sweats, changes in weight, changes in appetite HEENT: Denies headaches, ear pain, changes in vision, rhinorrhea, sore throat CV: Denies CP, palpitations, SOB, orthopnea Pulm: Denies SOB, cough, wheezing GI: Denies abdominal pain, nausea, vomiting, diarrhea, constipation  + cloudy urine GU: Denies dysuria, hematuria, frequency, vaginal discharge Msk: Denies muscle cramps, joint pains Neuro: Denies weakness, numbness, tingling Skin: Denies rashes, bruising Psych: Denies depression, anxiety, hallucinations    Objective:    Blood pressure 102/64, pulse 88, temperature 98.8 F (37.1 C), temperature source Oral, weight 206 lb (93.4 kg), SpO2 96 %.  Gen. Pleasant, well-nourished, in no distress, normal affect  Lungs: no accessory muscle use, CTAB,  no wheezes or rales Cardiovascular: RRR, no peripheral edema Neuro:  A&Ox3, CN II-XII intact, normal gait Skin:  Warm, no lesions/ rash   Wt Readings from Last 3 Encounters:  04/10/18 206 lb (93.4 kg)  04/08/18 204 lb (92.5 kg)  04/07/18 203 lb 12.8 oz (92.4 kg)    Lab Results  Component Value Date   WBC 8.7 02/19/2018   HGB 11.5 02/19/2018   HCT 35.3 02/19/2018   PLT 286 02/19/2018   GLUCOSE 162 (H) 02/19/2018   CHOL 159 06/14/2017   TRIG 123.0 06/14/2017   HDL 41.20 06/14/2017   LDLDIRECT 98.0 05/24/2014   LDLCALC 93 06/14/2017   ALT 32 06/14/2017   AST 26 06/14/2017   NA 136 02/19/2018   K 4.5 02/19/2018   CL 103 02/19/2018   CREATININE 0.76 02/19/2018   BUN 15 02/19/2018   CO2 25 02/19/2018   TSH 0.79 08/05/2017   HGBA1C 7.4 (A) 04/07/2018   MICROALBUR 0.8 10/07/2015    Assessment/Plan:  Acute cystitis without hematuria  -We will send for culture -Given Rx for Cipro given weekend - Plan: ciprofloxacin (CIPRO) 500 MG tablet  Cloudy urine  -UA with SG 1.015, 2+ leuks - Plan: POC Urinalysis Dipstick, Urine Culture   Follow-up PRN with PCP  Grier Mitts, MD

## 2018-04-10 NOTE — Patient Instructions (Signed)

## 2018-04-12 LAB — URINE CULTURE
MICRO NUMBER:: 91520863
SPECIMEN QUALITY:: ADEQUATE

## 2018-04-14 ENCOUNTER — Other Ambulatory Visit: Payer: Self-pay | Admitting: Adult Health

## 2018-04-14 ENCOUNTER — Ambulatory Visit: Payer: Medicare Other | Admitting: Internal Medicine

## 2018-04-14 ENCOUNTER — Other Ambulatory Visit: Payer: Medicare Other

## 2018-04-14 DIAGNOSIS — R0609 Other forms of dyspnea: Secondary | ICD-10-CM

## 2018-04-14 DIAGNOSIS — I350 Nonrheumatic aortic (valve) stenosis: Secondary | ICD-10-CM

## 2018-04-14 DIAGNOSIS — R0600 Dyspnea, unspecified: Secondary | ICD-10-CM

## 2018-04-14 NOTE — Telephone Encounter (Signed)
Requested medication (s) are due for refill today: yes  Requested medication (s) are on the active medication list: yes  Last refill:  Last refilled by historical provider  Future visit scheduled: No  Notes to clinic:  Unable to refill per protocol. Medication previously ordered by historical provider    Requested Prescriptions  Pending Prescriptions Disp Refills   nortriptyline (PAMELOR) 25 MG capsule      Sig: Take 1 capsule (25 mg total) by mouth at bedtime.     Psychiatry:  Antidepressants - Heterocyclics (TCAs) Passed - 04/14/2018  6:17 PM      Passed - Valid encounter within last 6 months    Recent Outpatient Visits          4 days ago Acute cystitis without hematuria   [facility] [place], MD   2 weeks ago Acute cystitis with hematuria   [place] [facility] [place], MD   10 months ago Routine general medical examination at a health care facility   [facility] [place], [place], NP   1 year ago Viral URI with cough   [facility] at [facility], Alinda Sierras, MD   1 year ago Essential hypertension, benign   Therapist, music at [facility], [place], NP      Future Appointments            In 3 months Wynetta Fines, Technical sales engineer at [place], [facility]

## 2018-04-14 NOTE — Telephone Encounter (Signed)
Copied from [place] [phone]. Topic: Quick Communication - Rx Refill/Question >> Apr 14, 2018  6:14 PM Blase Mess A wrote: Medication: nortriptyline (PAMELOR) 25 MG capsule [[id number]]   Has the patient contacted their pharmacy? Yes  (Agent: If no, request that the patient contact the pharmacy for the refill.) (Agent: If yes, when and what did the pharmacy advise?)  Preferred Pharmacy (with phone number or street name): [facility] [place], [place] AT [facility] OF [place] [phone] (Phone) [phone] (Fax)    Agent: Please be advised that RX refills may take up to 3 business days. We ask that you follow-up with your pharmacy.

## 2018-04-14 NOTE — Telephone Encounter (Signed)
Denied.  Not filled since 2017.

## 2018-04-15 ENCOUNTER — Other Ambulatory Visit: Payer: Self-pay | Admitting: Gastroenterology

## 2018-04-15 ENCOUNTER — Other Ambulatory Visit: Payer: Self-pay | Admitting: Endocrinology

## 2018-04-15 LAB — CBC
Hematocrit: 34.1 % (ref 34.0–46.6)
Hemoglobin: 10.7 g/dL — ABNORMAL LOW (ref 11.1–15.9)
MCH: 19.9 pg — ABNORMAL LOW (ref 26.6–33.0)
MCHC: 31.4 g/dL — ABNORMAL LOW (ref 31.5–35.7)
MCV: 63 fL — ABNORMAL LOW (ref 79–97)
Platelets: 311 10*3/uL (ref 150–450)
RBC: 5.39 x10E6/uL — ABNORMAL HIGH (ref 3.77–5.28)
RDW: 16 % — ABNORMAL HIGH (ref 12.3–15.4)
WBC: 7.3 10*3/uL (ref 3.4–10.8)

## 2018-04-15 LAB — BASIC METABOLIC PANEL
BUN/Creatinine Ratio: 23 (ref 12–28)
BUN: 21 mg/dL (ref 8–27)
CO2: 19 mmol/L — ABNORMAL LOW (ref 20–29)
Calcium: 9.9 mg/dL (ref 8.7–10.3)
Chloride: 102 mmol/L (ref 96–106)
Creatinine, Ser: 0.9 mg/dL (ref 0.57–1.00)
GFR calc Af Amer: 73 mL/min/{1.73_m2} (ref >59)
GFR calc non Af Amer: 63 mL/min/{1.73_m2} (ref >59)
Glucose: 269 mg/dL — ABNORMAL HIGH (ref 65–99)
Potassium: 4.6 mmol/L (ref 3.5–5.2)
Sodium: 135 mmol/L (ref 134–144)

## 2018-04-15 MED ORDER — NORTRIPTYLINE HCL 25 MG PO CAPS: 25.0000 mg | ORAL_CAPSULE | Freq: Every day | ORAL | 1 refills | Status: DC

## 2018-04-15 NOTE — Telephone Encounter (Signed)
Can we call the pharmacy and see who the last person to prescribe the medication was

## 2018-04-15 NOTE — Telephone Encounter (Signed)
Breanna Webster, I called and spoke to the pharmacy.  Was told the last prescriptions were filled on 6/18 and 9/13.  Pharmacy claims both of these have your name on them.  I searched the patient's record and have no authorizations or refill requests.  I am unsure how these prescriptions have your name on them.  The pharmacy was unable to tell me if they were electronic or called in. Please advise.

## 2018-04-15 NOTE — Telephone Encounter (Signed)
Called and spoke to the pt.  Advised that the last prescription filled by [facility] was Dec 2017.  Asked her to look at the bottle to find out who has been filling the medication.  Pt states the bottle has been thrown out.  Pharmacy gave her a 3 day supply.  Pt does not want to be without her medication.  She states she is using it for sleep.  Please advise.

## 2018-04-17 ENCOUNTER — Telehealth: Payer: Self-pay | Admitting: Interventional Cardiology

## 2018-04-17 ENCOUNTER — Telehealth: Payer: Self-pay | Admitting: Endocrinology

## 2018-04-17 NOTE — Telephone Encounter (Signed)
Spoke to pt to inform her that this was sent on 04/15/18 to her pharmacy, asked her to call us back if it is not there

## 2018-04-17 NOTE — Telephone Encounter (Signed)
SYNTHROID 175 MCG tablet  Patient stated that the pharmacy is requesting a new prescription be sent into the pharmacy Patient is requesting this be sent today .       [facility] [address], [address] AT [place]

## 2018-04-17 NOTE — Telephone Encounter (Signed)
New Message   Please call Pt, has questions about having stent placed.  If no answer at home, call mobile.

## 2018-04-17 NOTE — Telephone Encounter (Signed)
Spoke with pt and she just wanted to clarify ASA instructions for the day of cath.  Advised pt to take that morning.  Pt appreciative for call.

## 2018-04-20 DIAGNOSIS — I2510 Atherosclerotic heart disease of native coronary artery without angina pectoris: Secondary | ICD-10-CM | POA: Diagnosis present

## 2018-04-20 DIAGNOSIS — I10 Essential (primary) hypertension: Secondary | ICD-10-CM | POA: Diagnosis not present

## 2018-04-20 DIAGNOSIS — E785 Hyperlipidemia, unspecified: Secondary | ICD-10-CM | POA: Diagnosis not present

## 2018-04-20 DIAGNOSIS — I5032 Chronic diastolic (congestive) heart failure: Secondary | ICD-10-CM | POA: Diagnosis not present

## 2018-04-20 NOTE — H&P (Signed)
CARDIOLOGY History and Physical:    Date:  04/20/2018   ID:  Breanna Webster, DOB 22-Aug-1945, MRN [id number]  PCP:  [name], NP  Cardiologist:  Sinclair Grooms, MD   Referring MD: No ref. provider found   Reason: Precardiac Stent H and P  History of Present Illness:    Breanna Webster is a 72 y.o. female with a hx of pulmonary hypertension, DM II, moderate aortic stenosis, LBBB, GERD, and reactive airways disease who was found at cath in October 20199, to have 80-90% mid RCA stenosis.  Evaluated by pulmonary and Structural heart team to determine the cause of DOE. Not felt to have paradoxical low flow low gradient aortic stenosis. Also pulmonary could not explain dyspnea on basis of pulmonary disease. All have recommended treating RCA with stent to exclude anginal equivalent symptom related to CAD.  Has had extensive dental implants since cath in October without cardiac complication.  Dyspnea persists with exertion and at rest.  Past Medical History:  Diagnosis Date  . Arthritis   . Carotid artery stenosis    Mild  . Colon polyps   . Diabetes mellitus   . Diverticulitis   . Diverticulosis   . GERD (gastroesophageal reflux disease)   . Grave's disease   . Heart murmur    mitral prolapse  . History of colonic polyps 05/22/2017  . Hyperlipidemia   . Hypertension   . IBS (irritable bowel syndrome)   . Osteopenia   . Thalassemia minor     Past Surgical History:  Procedure Laterality Date  . [place] STUDY N/A 03/03/2018   Procedure: [place] STUDY;  Surgeon: Mauri Pole, MD;  Location: [facility];  Service: Endoscopy;  Laterality: N/A;  . CHOLECYSTECTOMY    . ESOPHAGEAL MANOMETRY N/A 03/03/2018   Procedure: ESOPHAGEAL MANOMETRY (EM);  Surgeon: Mauri Pole, MD;  Location: [facility];  Service: Endoscopy;  Laterality: N/A;  . HIATAL HERNIA REPAIR    . HYSTEROSCOPY     fibroids  . laparoscopy     fibroids  . RIGHT/LEFT HEART CATH AND  CORONARY ANGIOGRAPHY N/A 02/20/2018   Procedure: RIGHT/LEFT HEART CATH AND CORONARY ANGIOGRAPHY;  Surgeon: [name], MD;  Location: [facility];  Service: Cardiovascular;  Laterality: N/A;    Current Medications: Current Meds  Medication Sig  . aspirin 81 MG tablet Take 81 mg by mouth daily.    . bisoprolol (ZEBETA) 5 MG tablet Take 1 tablet (5 mg total) by mouth daily.  . chlorpheniramine (CHLOR-TRIMETON) 4 MG tablet Take 4 mg by mouth every 4 (four) hours as needed for allergies.  . cholecalciferol (VITAMIN D) 1000 units tablet Take 1,000 Units by mouth daily.  Marland Kitchen escitalopram (LEXAPRO) 20 MG tablet TAKE 1 TABLET BY MOUTH ONCE DAILY  . hydrochlorothiazide (HYDRODIURIL) 12.5 MG tablet TAKE 1 TABLET(12.5 MG) BY MOUTH DAILY (Patient taking differently: Take 12.5 mg by mouth daily. )  . insulin NPH Human (NOVOLIN N RELION) 100 UNIT/ML injection Inject 1.2 mLs (120 Units total) into the skin every morning. And syringes 1/day  . losartan (COZAAR) 50 MG tablet Take 1 tablet (50 mg total) by mouth daily.  . Multiple Vitamin (MULITIVITAMIN WITH MINERALS) TABS Take 1 tablet by mouth daily.  . pantoprazole (PROTONIX) 40 MG tablet Take 1 tablet (40 mg total) by mouth 2 (two) times daily.  . vitamin E 400 UNIT capsule Take 400 Units by mouth daily.  . [DISCONTINUED] nortriptyline (PAMELOR)  25 MG capsule Take 25 mg by mouth at bedtime.  . [DISCONTINUED] SYNTHROID 175 MCG tablet TAKE 1 TABLET BY MOUTH ONCE DAILY.     Allergies:   Statins   Social History   Socioeconomic History  . Marital status: Married    Spouse name: Not on file  . Number of children: 2  . Years of education: Not on file  . Highest education level: Not on file  Occupational History  . Occupation: Retired  Scientific laboratory technician  . Financial resource strain: Not on file  . Food insecurity:    Worry: Not on file    Inability: Not on file  . Transportation needs:    Medical: Not on file    Non-medical: Not on file    Tobacco Use  . Smoking status: Never Smoker  . Smokeless tobacco: Never Used  Substance and Sexual Activity  . Alcohol use: No    Alcohol/week: 0.0 standard drinks  . Drug use: No  . Sexual activity: Not on file  Lifestyle  . Physical activity:    Days per week: Not on file    Minutes per session: Not on file  . Stress: Not on file  Relationships  . Social connections:    Talks on phone: Not on file    Gets together: Not on file    Attends religious service: Not on file    Active member of club or organization: Not on file    Attends meetings of clubs or organizations: Not on file    Relationship status: Not on file  Other Topics Concern  . Not on file  Social History Narrative   Lives with husband in a one story home.     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   Education: college.     Family History: The patient's family history includes Headache in her unknown relative; Healthy in her son. There is no history of Colon cancer, Pancreatic cancer, Rectal cancer, or Stomach cancer. She was adopted.  ROS:   Please see the history of present illness.    She is having heartburn this morning she feels related to 600 mg of Plavix that was given in holding.  She has dental work coming up in approximately 1 month.  Difficulty walking because of dyspnea and decreased energy.  All other systems reviewed and are negative.  EKGs/Labs/Other Studies Reviewed:    The following studies were reviewed today: No new data.  Precath labs are normal  EKG:  EKG is normal sinus rhythm, left bundle branch block.  No change from prior.  Recent Labs: 06/14/2017: ALT 32 08/05/2017: TSH 0.79 02/19/2018: NT-Pro BNP 270 04/14/2018: BUN 21; Creatinine, Ser 0.90; Hemoglobin 10.7; Platelets 311; Potassium 4.6; Sodium 135  Recent Lipid Panel    Component Value Date/Time   CHOL 159 06/14/2017 0826   CHOL 156 11/21/2016 1113    TRIG 123.0 06/14/2017 0826   HDL 41.20 06/14/2017 0826   HDL 44 11/21/2016 1113   CHOLHDL 4 06/14/2017 0826   VLDL 24.6 06/14/2017 0826   LDLCALC 93 06/14/2017 0826   LDLCALC 91 11/21/2016 1113   LDLDIRECT 98.0 05/24/2014 1343    Physical Exam:    VS:  There were no vitals taken for this visit.    Wt Readings from Last 3 Encounters:  04/10/18 93.4 kg  04/08/18 92.5 kg  04/07/18 92.4 kg     GEN: Anxious. No acute distress  HEENT: Normal NECK: No JVD. LYMPHATICS: No lymphadenopathy CARDIAC: RRR.  3/6 crescendo decrescendo systolic with no diastolic murmur.  No gallop, and no edema VASCULAR: Pulses 2+ bilateral radial and carotid., Bruits are present bilateral transmitted from aorta. RESPIRATORY:  Clear to auscultation without rales, wheezing or rhonchi  ABDOMEN: Soft, non-tender, non-distended, No pulsatile mass, MUSCULOSKELETAL: No deformity  SKIN: Warm and dry NEUROLOGIC:  Alert and oriented x 3 PSYCHIATRIC:  Normal affect   ASSESSMENT:    1. CAD, native with dyspnea as anginal equivalent. Plan PCI RCA stenosis. 2. Hypertension with good control 3. Hyperlipidemia 4. DOE   PLAN:    In order of problems listed above:  The patient was counseled to undergo left heart catheterization, coronary angiography, and percutaneous coronary intervention with stent implantation. The procedural risks and benefits were discussed in detail. The risks discussed included death, stroke, myocardial infarction, life-threatening bleeding, limb ischemia, kidney injury, allergy, and possible emergency cardiac surgery. The risk of these significant complications were estimated to occur less than 1% of the time. After discussion, the patient has agreed to proceed. Controlled On therapy Possibly an anginal equivalent.  Limiting in nature.  PCI on RCA has been recommended by colleagues to treat potential anginal equivalent.   Medication Adjustments/Labs and Tests Ordered: Current medicines are  reviewed at length with the patient today.  Concerns regarding medicines are outlined above.  No orders of the defined types were placed in this encounter.  No orders of the defined types were placed in this encounter.   There are no outpatient Patient Instructions on file for this admission.   Signed, Sinclair Grooms, MD  04/20/2018 3:43 PM    [place]

## 2018-04-20 NOTE — H&P (View-Only) (Signed)
CARDIOLOGY History and Physical:    Date:  04/20/2018   ID:  Breanna Webster, DOB Nov 08, 1945, MRN [id number]  PCP:  Breanna Peng, NP  Cardiologist:  Sinclair Grooms, MD   Referring MD: No ref. provider found   Reason: Precardiac Stent H and P  History of Present Illness:    Breanna Webster is a 72 y.o. female with a hx of pulmonary hypertension, DM II, moderate aortic stenosis, LBBB, GERD, and reactive airways disease who was found at cath in October 20199, to have 80-90% mid RCA stenosis.  Evaluated by pulmonary and Structural heart team to determine the cause of DOE. Not felt to have paradoxical low flow low gradient aortic stenosis. Also pulmonary could not explain dyspnea on basis of pulmonary disease. All have recommended treating RCA with stent to exclude anginal equivalent symptom related to CAD.  Has had extensive dental implants since cath in October without cardiac complication.  Dyspnea persists with exertion and at rest.  Past Medical History:  Diagnosis Date  . Arthritis   . Carotid artery stenosis    Mild  . Colon polyps   . Diabetes mellitus   . Diverticulitis   . Diverticulosis   . GERD (gastroesophageal reflux disease)   . Grave's disease   . Heart murmur    mitral prolapse  . History of colonic polyps 05/22/2017  . Hyperlipidemia   . Hypertension   . IBS (irritable bowel syndrome)   . Osteopenia   . Thalassemia minor     Past Surgical History:  Procedure Laterality Date  . [place] STUDY N/A 03/03/2018   Procedure: [place] STUDY;  Surgeon: Mauri Pole, MD;  Location: [facility];  Service: Endoscopy;  Laterality: N/A;  . CHOLECYSTECTOMY    . ESOPHAGEAL MANOMETRY N/A 03/03/2018   Procedure: ESOPHAGEAL MANOMETRY (EM);  Surgeon: Mauri Pole, MD;  Location: [facility];  Service: Endoscopy;  Laterality: N/A;  . HIATAL HERNIA REPAIR    . HYSTEROSCOPY     fibroids  . laparoscopy     fibroids  . RIGHT/LEFT HEART CATH AND  CORONARY ANGIOGRAPHY N/A 02/20/2018   Procedure: RIGHT/LEFT HEART CATH AND CORONARY ANGIOGRAPHY;  Surgeon: [name], MD;  Location: [facility];  Service: Cardiovascular;  Laterality: N/A;    Current Medications: Current Meds  Medication Sig  . aspirin 81 MG tablet Take 81 mg by mouth daily.    . bisoprolol (ZEBETA) 5 MG tablet Take 1 tablet (5 mg total) by mouth daily.  . chlorpheniramine (CHLOR-TRIMETON) 4 MG tablet Take 4 mg by mouth every 4 (four) hours as needed for allergies.  . cholecalciferol (VITAMIN D) 1000 units tablet Take 1,000 Units by mouth daily.  Marland Kitchen escitalopram (LEXAPRO) 20 MG tablet TAKE 1 TABLET BY MOUTH ONCE DAILY  . hydrochlorothiazide (HYDRODIURIL) 12.5 MG tablet TAKE 1 TABLET(12.5 MG) BY MOUTH DAILY (Patient taking differently: Take 12.5 mg by mouth daily. )  . insulin NPH Human (NOVOLIN N RELION) 100 UNIT/ML injection Inject 1.2 mLs (120 Units total) into the skin every morning. And syringes 1/day  . losartan (COZAAR) 50 MG tablet Take 1 tablet (50 mg total) by mouth daily.  . Multiple Vitamin (MULITIVITAMIN WITH MINERALS) TABS Take 1 tablet by mouth daily.  . pantoprazole (PROTONIX) 40 MG tablet Take 1 tablet (40 mg total) by mouth 2 (two) times daily.  . vitamin E 400 UNIT capsule Take 400 Units by mouth daily.  . [DISCONTINUED] nortriptyline (PAMELOR)  25 MG capsule Take 25 mg by mouth at bedtime.  . [DISCONTINUED] SYNTHROID 175 MCG tablet TAKE 1 TABLET BY MOUTH ONCE DAILY.     Allergies:   Statins   Social History   Socioeconomic History  . Marital status: Married    Spouse name: Not on file  . Number of children: 2  . Years of education: Not on file  . Highest education level: Not on file  Occupational History  . Occupation: Retired  Scientific laboratory technician  . Financial resource strain: Not on file  . Food insecurity:    Worry: Not on file    Inability: Not on file  . Transportation needs:    Medical: Not on file    Non-medical: Not on file    Tobacco Use  . Smoking status: Never Smoker  . Smokeless tobacco: Never Used  Substance and Sexual Activity  . Alcohol use: No    Alcohol/week: 0.0 standard drinks  . Drug use: No  . Sexual activity: Not on file  Lifestyle  . Physical activity:    Days per week: Not on file    Minutes per session: Not on file  . Stress: Not on file  Relationships  . Social connections:    Talks on phone: Not on file    Gets together: Not on file    Attends religious service: Not on file    Active member of club or organization: Not on file    Attends meetings of clubs or organizations: Not on file    Relationship status: Not on file  Other Topics Concern  . Not on file  Social History Narrative   Lives with husband in a one story home.     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   Education: college.     Family History: The patient's family history includes Headache in her unknown relative; Healthy in her son. There is no history of Colon cancer, Pancreatic cancer, Rectal cancer, or Stomach cancer. She was adopted.  ROS:   Please see the history of present illness.    She is having heartburn this morning she feels related to 600 mg of Plavix that was given in holding.  She has dental work coming up in approximately 1 month.  Difficulty walking because of dyspnea and decreased energy.  All other systems reviewed and are negative.  EKGs/Labs/Other Studies Reviewed:    The following studies were reviewed today: No new data.  Precath labs are normal  EKG:  EKG is normal sinus rhythm, left bundle branch block.  No change from prior.  Recent Labs: 06/14/2017: ALT 32 08/05/2017: TSH 0.79 02/19/2018: NT-Pro BNP 270 04/14/2018: BUN 21; Creatinine, Ser 0.90; Hemoglobin 10.7; Platelets 311; Potassium 4.6; Sodium 135  Recent Lipid Panel    Component Value Date/Time   CHOL 159 06/14/2017 0826   CHOL 156 11/21/2016 1113    TRIG 123.0 06/14/2017 0826   HDL 41.20 06/14/2017 0826   HDL 44 11/21/2016 1113   CHOLHDL 4 06/14/2017 0826   VLDL 24.6 06/14/2017 0826   LDLCALC 93 06/14/2017 0826   LDLCALC 91 11/21/2016 1113   LDLDIRECT 98.0 05/24/2014 1343    Physical Exam:    VS:  There were no vitals taken for this visit.    Wt Readings from Last 3 Encounters:  04/10/18 93.4 kg  04/08/18 92.5 kg  04/07/18 92.4 kg     GEN: Anxious. No acute distress  HEENT: Normal NECK: No JVD. LYMPHATICS: No lymphadenopathy CARDIAC: RRR.  3/6 crescendo decrescendo systolic with no diastolic murmur.  No gallop, and no edema VASCULAR: Pulses 2+ bilateral radial and carotid., Bruits are present bilateral transmitted from aorta. RESPIRATORY:  Clear to auscultation without rales, wheezing or rhonchi  ABDOMEN: Soft, non-tender, non-distended, No pulsatile mass, MUSCULOSKELETAL: No deformity  SKIN: Warm and dry NEUROLOGIC:  Alert and oriented x 3 PSYCHIATRIC:  Normal affect   ASSESSMENT:    1. CAD, native with dyspnea as anginal equivalent. Plan PCI RCA stenosis. 2. Hypertension with good control 3. Hyperlipidemia 4. DOE   PLAN:    In order of problems listed above:  The patient was counseled to undergo left heart catheterization, coronary angiography, and percutaneous coronary intervention with stent implantation. The procedural risks and benefits were discussed in detail. The risks discussed included death, stroke, myocardial infarction, life-threatening bleeding, limb ischemia, kidney injury, allergy, and possible emergency cardiac surgery. The risk of these significant complications were estimated to occur less than 1% of the time. After discussion, the patient has agreed to proceed. Controlled On therapy Possibly an anginal equivalent.  Limiting in nature.  PCI on RCA has been recommended by colleagues to treat potential anginal equivalent.   Medication Adjustments/Labs and Tests Ordered: Current medicines are  reviewed at length with the patient today.  Concerns regarding medicines are outlined above.  No orders of the defined types were placed in this encounter.  No orders of the defined types were placed in this encounter.   There are no outpatient Patient Instructions on file for this admission.   Signed, Sinclair Grooms, MD  04/20/2018 3:43 PM    [place]

## 2018-04-21 ENCOUNTER — Ambulatory Visit (HOSPITAL_COMMUNITY)
Admission: RE | Admit: 2018-04-21 | Discharge: 2018-04-21 | Disposition: A | Discharge status: Home / Self Care | Payer: Medicare Other | Attending: Interventional Cardiology | Admitting: Interventional Cardiology

## 2018-04-21 ENCOUNTER — Other Ambulatory Visit: Payer: Self-pay

## 2018-04-21 ENCOUNTER — Encounter (HOSPITAL_COMMUNITY): Payer: Self-pay | Admitting: Interventional Cardiology

## 2018-04-21 ENCOUNTER — Encounter (HOSPITAL_COMMUNITY)
Admission: RE | Disposition: A | Discharge status: Home / Self Care | Payer: Self-pay | Source: Home / Self Care | Attending: Interventional Cardiology

## 2018-04-21 DIAGNOSIS — IMO0002 Reserved for concepts with insufficient information to code with codable children: Secondary | ICD-10-CM

## 2018-04-21 DIAGNOSIS — I447 Left bundle-branch block, unspecified: Secondary | ICD-10-CM | POA: Diagnosis not present

## 2018-04-21 DIAGNOSIS — I272 Pulmonary hypertension, unspecified: Secondary | ICD-10-CM | POA: Diagnosis not present

## 2018-04-21 DIAGNOSIS — Z955 Presence of coronary angioplasty implant and graft: Secondary | ICD-10-CM | POA: Diagnosis not present

## 2018-04-21 DIAGNOSIS — E0500 Thyrotoxicosis with diffuse goiter without thyrotoxic crisis or storm: Secondary | ICD-10-CM | POA: Insufficient documentation

## 2018-04-21 DIAGNOSIS — E66811 Obesity, class 1: Secondary | ICD-10-CM | POA: Diagnosis present

## 2018-04-21 DIAGNOSIS — I5030 Unspecified diastolic (congestive) heart failure: Secondary | ICD-10-CM | POA: Diagnosis present

## 2018-04-21 DIAGNOSIS — M1990 Unspecified osteoarthritis, unspecified site: Secondary | ICD-10-CM | POA: Diagnosis not present

## 2018-04-21 DIAGNOSIS — M8580 Other specified disorders of bone density and structure, unspecified site: Secondary | ICD-10-CM | POA: Insufficient documentation

## 2018-04-21 DIAGNOSIS — Z79899 Other long term (current) drug therapy: Secondary | ICD-10-CM | POA: Insufficient documentation

## 2018-04-21 DIAGNOSIS — I10 Essential (primary) hypertension: Secondary | ICD-10-CM | POA: Diagnosis present

## 2018-04-21 DIAGNOSIS — I2510 Atherosclerotic heart disease of native coronary artery without angina pectoris: Secondary | ICD-10-CM | POA: Diagnosis present

## 2018-04-21 DIAGNOSIS — K219 Gastro-esophageal reflux disease without esophagitis: Secondary | ICD-10-CM | POA: Diagnosis not present

## 2018-04-21 DIAGNOSIS — I25118 Atherosclerotic heart disease of native coronary artery with other forms of angina pectoris: Secondary | ICD-10-CM | POA: Diagnosis not present

## 2018-04-21 DIAGNOSIS — K589 Irritable bowel syndrome without diarrhea: Secondary | ICD-10-CM | POA: Insufficient documentation

## 2018-04-21 DIAGNOSIS — R0609 Other forms of dyspnea: Secondary | ICD-10-CM | POA: Diagnosis not present

## 2018-04-21 DIAGNOSIS — E119 Type 2 diabetes mellitus without complications: Secondary | ICD-10-CM | POA: Diagnosis not present

## 2018-04-21 DIAGNOSIS — Z7982 Long term (current) use of aspirin: Secondary | ICD-10-CM | POA: Insufficient documentation

## 2018-04-21 DIAGNOSIS — E785 Hyperlipidemia, unspecified: Secondary | ICD-10-CM | POA: Diagnosis not present

## 2018-04-21 DIAGNOSIS — Z794 Long term (current) use of insulin: Secondary | ICD-10-CM | POA: Diagnosis not present

## 2018-04-21 DIAGNOSIS — E0865 Diabetes mellitus due to underlying condition with hyperglycemia: Secondary | ICD-10-CM

## 2018-04-21 DIAGNOSIS — Z888 Allergy status to other drugs, medicaments and biological substances status: Secondary | ICD-10-CM | POA: Diagnosis not present

## 2018-04-21 DIAGNOSIS — I5032 Chronic diastolic (congestive) heart failure: Secondary | ICD-10-CM | POA: Diagnosis not present

## 2018-04-21 DIAGNOSIS — E669 Obesity, unspecified: Secondary | ICD-10-CM | POA: Diagnosis present

## 2018-04-21 HISTORY — PX: CORONARY ANGIOGRAPHY: CATH118303

## 2018-04-21 LAB — GLUCOSE, CAPILLARY
Glucose-Capillary: 103 mg/dL — ABNORMAL HIGH (ref 70–99)
Glucose-Capillary: 75 mg/dL (ref 70–99)

## 2018-04-21 LAB — POCT ACTIVATED CLOTTING TIME: Activated Clotting Time: 285 seconds

## 2018-04-21 SURGERY — CORONARY ANGIOGRAPHY (CATH LAB)
Anesthesia: LOCAL | Laterality: Right

## 2018-04-21 MED ORDER — HEPARIN SODIUM (PORCINE) 1000 UNIT/ML IJ SOLN
INTRAMUSCULAR | Status: DC | PRN
  Administered 2018-04-21: 9000 [IU] via INTRAVENOUS

## 2018-04-21 MED ORDER — FENTANYL CITRATE (PF) 100 MCG/2ML IJ SOLN
INTRAMUSCULAR | Status: AC
  Filled 2018-04-21: qty 2

## 2018-04-21 MED ORDER — VERAPAMIL HCL 2.5 MG/ML IV SOLN
INTRAVENOUS | Status: DC | PRN
  Administered 2018-04-21 (×2): 10 mL via INTRA_ARTERIAL

## 2018-04-21 MED ORDER — MIDAZOLAM HCL 2 MG/2ML IJ SOLN
INTRAMUSCULAR | Status: AC
  Filled 2018-04-21: qty 2

## 2018-04-21 MED ORDER — FENTANYL CITRATE (PF) 100 MCG/2ML IJ SOLN
INTRAMUSCULAR | Status: DC | PRN
  Administered 2018-04-21 (×2): 25 ug via INTRAVENOUS
  Administered 2018-04-21: 50 ug via INTRAVENOUS

## 2018-04-21 MED ORDER — NITROGLYCERIN 1 MG/10 ML FOR IR/CATH LAB
INTRA_ARTERIAL | Status: AC
  Filled 2018-04-21: qty 10

## 2018-04-21 MED ORDER — SODIUM CHLORIDE 0.9 % IV SOLN: 250.0000 mL | INTRAVENOUS | Status: DC | PRN

## 2018-04-21 MED ORDER — SODIUM CHLORIDE 0.9 % WEIGHT BASED INFUSION
3.0000 mL/kg/h | INTRAVENOUS | Status: AC
  Administered 2018-04-21: 3 mL/kg/h via INTRAVENOUS

## 2018-04-21 MED ORDER — SODIUM CHLORIDE 0.9% FLUSH: 3.0000 mL | INTRAVENOUS | Status: DC | PRN

## 2018-04-21 MED ORDER — FAMOTIDINE IN NACL 20-0.9 MG/50ML-% IV SOLN
INTRAVENOUS | Status: AC
  Filled 2018-04-21: qty 50

## 2018-04-21 MED ORDER — VERAPAMIL HCL 2.5 MG/ML IV SOLN
INTRAVENOUS | Status: AC
  Filled 2018-04-21: qty 2

## 2018-04-21 MED ORDER — HEPARIN SODIUM (PORCINE) 1000 UNIT/ML IJ SOLN
INTRAMUSCULAR | Status: AC
  Filled 2018-04-21: qty 1

## 2018-04-21 MED ORDER — CLOPIDOGREL BISULFATE 75 MG PO TABS: 75.0000 mg | ORAL_TABLET | Freq: Every day | ORAL | Status: DC

## 2018-04-21 MED ORDER — HEPARIN (PORCINE) IN NACL 1000-0.9 UT/500ML-% IV SOLN
INTRAVENOUS | Status: DC | PRN
  Administered 2018-04-21: 500 mL

## 2018-04-21 MED ORDER — MIDAZOLAM HCL 2 MG/2ML IJ SOLN
INTRAMUSCULAR | Status: DC | PRN
  Administered 2018-04-21: 0.5 mg via INTRAVENOUS
  Administered 2018-04-21: 1 mg via INTRAVENOUS

## 2018-04-21 MED ORDER — FAMOTIDINE IN NACL 20-0.9 MG/50ML-% IV SOLN
INTRAVENOUS | Status: AC | PRN
  Administered 2018-04-21: 20 mg via INTRAVENOUS

## 2018-04-21 MED ORDER — ONDANSETRON HCL 4 MG/2ML IJ SOLN: 4.0000 mg | Freq: Four times a day (QID) | INTRAMUSCULAR | Status: DC | PRN

## 2018-04-21 MED ORDER — ASPIRIN 81 MG PO CHEW
81.0000 mg | CHEWABLE_TABLET | Freq: Once | ORAL | Status: AC
  Administered 2018-04-21: 81 mg via ORAL
  Filled 2018-04-21: qty 1

## 2018-04-21 MED ORDER — CLOPIDOGREL BISULFATE 75 MG PO TABS
600.0000 mg | ORAL_TABLET | Freq: Once | ORAL | Status: AC
  Administered 2018-04-21: 600 mg via ORAL
  Filled 2018-04-21: qty 8

## 2018-04-21 MED ORDER — SODIUM CHLORIDE 0.9% FLUSH: 3.0000 mL | Freq: Two times a day (BID) | INTRAVENOUS | Status: DC

## 2018-04-21 MED ORDER — LIDOCAINE HCL (PF) 1 % IJ SOLN
INTRAMUSCULAR | Status: DC | PRN
  Administered 2018-04-21: 2 mL

## 2018-04-21 MED ORDER — ACETAMINOPHEN 325 MG PO TABS: 650.0000 mg | ORAL_TABLET | ORAL | Status: DC | PRN

## 2018-04-21 MED ORDER — SODIUM CHLORIDE 0.9 % WEIGHT BASED INFUSION: 1.0000 mL/kg/h | INTRAVENOUS | Status: DC

## 2018-04-21 MED ORDER — LIDOCAINE HCL (PF) 1 % IJ SOLN
INTRAMUSCULAR | Status: AC
  Filled 2018-04-21: qty 30

## 2018-04-21 MED ORDER — SODIUM CHLORIDE 0.9 % IV SOLN: INTRAVENOUS | Status: DC

## 2018-04-21 MED ORDER — HEPARIN (PORCINE) IN NACL 1000-0.9 UT/500ML-% IV SOLN
INTRAVENOUS | Status: AC
  Filled 2018-04-21: qty 1000

## 2018-04-21 SURGICAL SUPPLY — 19 items
CATH INFINITI 5 FR JL3.5 (CATHETERS) ×2 IMPLANT
CATH LAUNCHER 5F ALR12 (CATHETERS) ×1 IMPLANT
CATH LAUNCHER 5F RADR (CATHETERS) ×1 IMPLANT
CATH LAUNCHER 6FR IMA (CATHETERS) ×2 IMPLANT
CATH VISTA GUIDE 6FR JR4 (CATHETERS) ×2 IMPLANT
CATH VISTA GUIDE 6FR XBRCA (CATHETERS) ×2 IMPLANT
CATHETER LAUNCHER 5F ALR12 (CATHETERS) ×3
CATHETER LAUNCHER 5F RADR (CATHETERS) ×3
DEVICE RAD COMP TR BAND LRG (VASCULAR PRODUCTS) ×2 IMPLANT
GLIDESHEATH SLEND A-KIT 6F 22G (SHEATH) ×2 IMPLANT
GUIDEWIRE INQWIRE 1.5J.035X260 (WIRE) ×1 IMPLANT
INQWIRE 1.5J .035X260CM (WIRE) ×3
KIT ENCORE 26 ADVANTAGE (KITS) ×2 IMPLANT
KIT HEART LEFT (KITS) ×3 IMPLANT
KIT HEMO VALVE WATCHDOG (MISCELLANEOUS) ×2 IMPLANT
PACK CARDIAC CATHETERIZATION (CUSTOM PROCEDURE TRAY) ×3 IMPLANT
SHEATH PROBE COVER 6X72 (BAG) ×2 IMPLANT
TRANSDUCER W/STOPCOCK (MISCELLANEOUS) ×3 IMPLANT
TUBING CIL FLEX 10 FLL-RA (TUBING) ×3 IMPLANT

## 2018-04-21 NOTE — Discharge Instructions (Signed)
Radial Site Care  This sheet gives you information about how to care for yourself after your procedure. Your health care provider may also give you more specific instructions. If you have problems or questions, contact your health care provider. What can I expect after the procedure? After the procedure, it is common to have:  Bruising and tenderness at the catheter insertion area. Follow these instructions at home: Medicines  Take over-the-counter and prescription medicines only as told by your health care provider. Insertion site care  Follow instructions from your health care provider about how to take care of your insertion site. Make sure you: ? Wash your hands with soap and water before you change your bandage (dressing). If soap and water are not available, use hand sanitizer. ? Change your dressing as told by your health care provider. ? Leave stitches (sutures), skin glue, or adhesive strips in place. These skin closures may need to stay in place for 2 weeks or longer. If adhesive strip edges start to loosen and curl up, you may trim the loose edges. Do not remove adhesive strips completely unless your health care provider tells you to do that.  Check your insertion site every day for signs of infection. Check for: ? Redness, swelling, or pain. ? Fluid or blood. ? Pus or a bad smell. ? Warmth.  Do not take baths, swim, or use a hot tub until your health care provider approves.  You may shower 24-48 hours after the procedure, or as directed by your health care provider. ? Remove the dressing and gently wash the site with plain soap and water. ? Pat the area dry with a clean towel. ? Do not rub the site. That could cause bleeding.  Do not apply powder or lotion to the site. Activity   For 24 hours after the procedure, or as directed by your health care provider: ? Do not flex or bend the affected arm. ? Do not push or pull heavy objects with the affected arm. ? Do not  drive yourself home from the hospital or clinic. You may drive 24 hours after the procedure unless your health care provider tells you not to. ? Do not operate machinery or power tools.  Do not lift anything that is heavier than 10 lb (4.5 kg), or the limit that you are told, until your health care provider says that it is safe.  Ask your health care provider when it is okay to: ? Return to work or school. ? Resume usual physical activities or sports. ? Resume sexual activity. General instructions  If the catheter site starts to bleed, raise your arm and put firm pressure on the site. If the bleeding does not stop, get help right away. This is a medical emergency.  If you went home on the same day as your procedure, a responsible adult should be with you for the first 24 hours after you arrive home.  Keep all follow-up visits as told by your health care provider. This is important. Contact a health care provider if:  You have a fever.  You have redness, swelling, or yellow drainage around your insertion site. Get help right away if:  You have unusual pain at the radial site.  The catheter insertion area swells very fast.  The insertion area is bleeding, and the bleeding does not stop when you hold steady pressure on the area. CALL 911  Your arm or hand becomes pale, cool, tingly, or numb. These symptoms may represent a  serious problem that is an emergency. Do not wait to see if the symptoms will go away. Get medical help right away. Call your local emergency services (911 in the [place] Do not drive yourself to the hospital. Summary  After the procedure, it is common to have bruising and tenderness at the site.  Follow instructions from your health care provider about how to take care of your radial site wound. Check the wound every day for signs of infection.  Do not lift anything that is heavier than 10 lb (4.5 kg), or the limit that you are told, until your health care provider  says that it is safe. This information is not intended to replace advice given to you by your health care provider. Make sure you discuss any questions you have with your health care provider. Document Released: 05/12/2010 Document Revised: 05/15/2017 Document Reviewed: 05/15/2017 Elsevier Interactive Patient Education  2019 [employer].

## 2018-04-21 NOTE — CV Procedure (Signed)
   Coronary angiography via right radial approach.  Failure to achieve PCI due to inability to cannulate right coronary from right radial using 6 French catheters.  Plan attempt procedure from right femoral and 24 to 72 hours.  Already loaded with Plavix.  Will continue and have patient scheduled for elective PCI from right femoral at 12 noon on 04/22/2018.

## 2018-04-22 ENCOUNTER — Ambulatory Visit (HOSPITAL_COMMUNITY)
Admission: RE | Admit: 2018-04-22 | Discharge: 2018-04-23 | Disposition: A | Discharge status: Home / Self Care | Payer: Medicare Other | Attending: Interventional Cardiology | Admitting: Interventional Cardiology

## 2018-04-22 ENCOUNTER — Ambulatory Visit: Payer: Medicare Other | Admitting: Internal Medicine

## 2018-04-22 ENCOUNTER — Ambulatory Visit (HOSPITAL_COMMUNITY)
Admission: RE | Disposition: A | Discharge status: Home / Self Care | Payer: Self-pay | Source: Home / Self Care | Attending: Interventional Cardiology

## 2018-04-22 ENCOUNTER — Encounter (HOSPITAL_COMMUNITY): Payer: Self-pay | Admitting: Interventional Cardiology

## 2018-04-22 ENCOUNTER — Other Ambulatory Visit: Payer: Self-pay

## 2018-04-22 DIAGNOSIS — Z888 Allergy status to other drugs, medicaments and biological substances status: Secondary | ICD-10-CM | POA: Diagnosis not present

## 2018-04-22 DIAGNOSIS — I25118 Atherosclerotic heart disease of native coronary artery with other forms of angina pectoris: Secondary | ICD-10-CM | POA: Insufficient documentation

## 2018-04-22 DIAGNOSIS — M1990 Unspecified osteoarthritis, unspecified site: Secondary | ICD-10-CM | POA: Diagnosis not present

## 2018-04-22 DIAGNOSIS — E119 Type 2 diabetes mellitus without complications: Secondary | ICD-10-CM | POA: Diagnosis not present

## 2018-04-22 DIAGNOSIS — E669 Obesity, unspecified: Secondary | ICD-10-CM | POA: Insufficient documentation

## 2018-04-22 DIAGNOSIS — M8580 Other specified disorders of bone density and structure, unspecified site: Secondary | ICD-10-CM | POA: Insufficient documentation

## 2018-04-22 DIAGNOSIS — E785 Hyperlipidemia, unspecified: Secondary | ICD-10-CM | POA: Insufficient documentation

## 2018-04-22 DIAGNOSIS — Z794 Long term (current) use of insulin: Secondary | ICD-10-CM | POA: Diagnosis not present

## 2018-04-22 DIAGNOSIS — I10 Essential (primary) hypertension: Secondary | ICD-10-CM | POA: Diagnosis not present

## 2018-04-22 DIAGNOSIS — Z955 Presence of coronary angioplasty implant and graft: Secondary | ICD-10-CM

## 2018-04-22 DIAGNOSIS — Z79899 Other long term (current) drug therapy: Secondary | ICD-10-CM | POA: Insufficient documentation

## 2018-04-22 DIAGNOSIS — D563 Thalassemia minor: Secondary | ICD-10-CM | POA: Insufficient documentation

## 2018-04-22 DIAGNOSIS — R0609 Other forms of dyspnea: Secondary | ICD-10-CM | POA: Diagnosis not present

## 2018-04-22 DIAGNOSIS — Z6834 Body mass index (BMI) 34.0-34.9, adult: Secondary | ICD-10-CM | POA: Insufficient documentation

## 2018-04-22 DIAGNOSIS — K589 Irritable bowel syndrome without diarrhea: Secondary | ICD-10-CM | POA: Insufficient documentation

## 2018-04-22 DIAGNOSIS — Z7982 Long term (current) use of aspirin: Secondary | ICD-10-CM | POA: Insufficient documentation

## 2018-04-22 DIAGNOSIS — I080 Rheumatic disorders of both mitral and aortic valves: Secondary | ICD-10-CM | POA: Diagnosis not present

## 2018-04-22 DIAGNOSIS — E039 Hypothyroidism, unspecified: Secondary | ICD-10-CM | POA: Insufficient documentation

## 2018-04-22 DIAGNOSIS — I272 Pulmonary hypertension, unspecified: Secondary | ICD-10-CM | POA: Diagnosis present

## 2018-04-22 DIAGNOSIS — K219 Gastro-esophageal reflux disease without esophagitis: Secondary | ICD-10-CM | POA: Insufficient documentation

## 2018-04-22 DIAGNOSIS — I6529 Occlusion and stenosis of unspecified carotid artery: Secondary | ICD-10-CM | POA: Insufficient documentation

## 2018-04-22 DIAGNOSIS — I447 Left bundle-branch block, unspecified: Secondary | ICD-10-CM | POA: Diagnosis not present

## 2018-04-22 DIAGNOSIS — R0789 Other chest pain: Secondary | ICD-10-CM | POA: Diagnosis present

## 2018-04-22 DIAGNOSIS — R0600 Dyspnea, unspecified: Secondary | ICD-10-CM | POA: Diagnosis present

## 2018-04-22 DIAGNOSIS — I2510 Atherosclerotic heart disease of native coronary artery without angina pectoris: Secondary | ICD-10-CM | POA: Insufficient documentation

## 2018-04-22 DIAGNOSIS — I350 Nonrheumatic aortic (valve) stenosis: Secondary | ICD-10-CM

## 2018-04-22 HISTORY — DX: Low back pain: M54.5

## 2018-04-22 HISTORY — DX: Low back pain, unspecified: M54.50

## 2018-04-22 HISTORY — DX: Hypothyroidism, unspecified: E03.9

## 2018-04-22 HISTORY — DX: Atherosclerotic heart disease of native coronary artery without angina pectoris: I25.10

## 2018-04-22 HISTORY — PX: CORONARY STENT INTERVENTION: CATH118234

## 2018-04-22 HISTORY — DX: Type 2 diabetes mellitus without complications: E11.9

## 2018-04-22 HISTORY — DX: Personal history of other diseases of the digestive system: Z87.19

## 2018-04-22 HISTORY — DX: Other chronic pain: G89.29

## 2018-04-22 LAB — CBC
HCT: 39.2 % (ref 36.0–46.0)
Hemoglobin: 11.5 g/dL — ABNORMAL LOW (ref 12.0–15.0)
MCH: 19.2 pg — ABNORMAL LOW (ref 26.0–34.0)
MCHC: 29.3 g/dL — ABNORMAL LOW (ref 30.0–36.0)
MCV: 65.6 fL — ABNORMAL LOW (ref 80.0–100.0)
Platelets: 205 10*3/uL (ref 150–400)
RBC: 5.98 MIL/uL — ABNORMAL HIGH (ref 3.87–5.11)
RDW: 17.3 % — ABNORMAL HIGH (ref 11.5–15.5)
WBC: 7 10*3/uL (ref 4.0–10.5)
nRBC: 0 % (ref 0.0–0.2)

## 2018-04-22 LAB — BASIC METABOLIC PANEL
Anion gap: 12 (ref 5–15)
BUN: 14 mg/dL (ref 8–23)
CO2: 25 mmol/L (ref 22–32)
Calcium: 9.6 mg/dL (ref 8.9–10.3)
Chloride: 103 mmol/L (ref 98–111)
Creatinine, Ser: 0.85 mg/dL (ref 0.44–1.00)
GFR calc Af Amer: >60 mL/min (ref >60)
GFR calc non Af Amer: >60 mL/min (ref >60)
Glucose, Bld: 112 mg/dL — ABNORMAL HIGH (ref 70–99)
Potassium: 5.4 mmol/L — ABNORMAL HIGH (ref 3.5–5.1)
Sodium: 140 mmol/L (ref 135–145)

## 2018-04-22 LAB — GLUCOSE, CAPILLARY
Glucose-Capillary: 125 mg/dL — ABNORMAL HIGH (ref 70–99)
Glucose-Capillary: 87 mg/dL (ref 70–99)
Glucose-Capillary: 217 mg/dL — ABNORMAL HIGH (ref 70–99)
Glucose-Capillary: 221 mg/dL — ABNORMAL HIGH (ref 70–99)

## 2018-04-22 LAB — POCT ACTIVATED CLOTTING TIME: Activated Clotting Time: 489 seconds

## 2018-04-22 SURGERY — CORONARY STENT INTERVENTION
Anesthesia: LOCAL

## 2018-04-22 MED ORDER — INSULIN ASPART 100 UNIT/ML ~~LOC~~ SOLN
0.0000 [IU] | Freq: Three times a day (TID) | SUBCUTANEOUS | Status: DC
  Administered 2018-04-22: 18:00:00 7 [IU] via SUBCUTANEOUS
  Administered 2018-04-23: 2 [IU] via SUBCUTANEOUS

## 2018-04-22 MED ORDER — ASPIRIN EC 81 MG PO TBEC
81.0000 mg | DELAYED_RELEASE_TABLET | Freq: Every day | ORAL | Status: DC
  Administered 2018-04-23: 81 mg via ORAL
  Filled 2018-04-22: qty 1

## 2018-04-22 MED ORDER — BIVALIRUDIN BOLUS VIA INFUSION - CUPID
INTRAVENOUS | Status: DC | PRN
  Administered 2018-04-22: 68.025 mg via INTRAVENOUS

## 2018-04-22 MED ORDER — SODIUM CHLORIDE 0.9% FLUSH: 3.0000 mL | INTRAVENOUS | Status: DC | PRN

## 2018-04-22 MED ORDER — ADULT MULTIVITAMIN W/MINERALS CH: 1.0000 | ORAL_TABLET | Freq: Every day | ORAL | Status: DC

## 2018-04-22 MED ORDER — HEPARIN SODIUM (PORCINE) 5000 UNIT/ML IJ SOLN
5000.0000 [IU] | Freq: Three times a day (TID) | INTRAMUSCULAR | Status: DC
  Administered 2018-04-22 – 2018-04-23 (×2): 5000 [IU] via SUBCUTANEOUS
  Filled 2018-04-22 (×2): qty 1

## 2018-04-22 MED ORDER — INSULIN ASPART 100 UNIT/ML ~~LOC~~ SOLN
0.0000 [IU] | Freq: Every day | SUBCUTANEOUS | Status: DC
  Administered 2018-04-22: 2 [IU] via SUBCUTANEOUS

## 2018-04-22 MED ORDER — LIDOCAINE HCL (PF) 1 % IJ SOLN
INTRAMUSCULAR | Status: AC
  Filled 2018-04-22: qty 30

## 2018-04-22 MED ORDER — MIDAZOLAM HCL 2 MG/2ML IJ SOLN
INTRAMUSCULAR | Status: DC | PRN
  Administered 2018-04-22 (×2): 1 mg via INTRAVENOUS

## 2018-04-22 MED ORDER — SODIUM CHLORIDE 0.9 % WEIGHT BASED INFUSION: 1.0000 mL/kg/h | INTRAVENOUS | Status: DC

## 2018-04-22 MED ORDER — ASPIRIN 81 MG PO CHEW: 81.0000 mg | CHEWABLE_TABLET | Freq: Every day | ORAL | Status: DC

## 2018-04-22 MED ORDER — INSULIN ASPART 100 UNIT/ML ~~LOC~~ SOLN: 0.0000 [IU] | Freq: Three times a day (TID) | SUBCUTANEOUS | Status: DC

## 2018-04-22 MED ORDER — ASPIRIN 81 MG PO CHEW
81.0000 mg | CHEWABLE_TABLET | ORAL | Status: AC
  Administered 2018-04-22: 81 mg via ORAL

## 2018-04-22 MED ORDER — INSULIN NPH (HUMAN) (ISOPHANE) 100 UNIT/ML ~~LOC~~ SUSP: 120.0000 [IU] | SUBCUTANEOUS | Status: DC

## 2018-04-22 MED ORDER — MIDAZOLAM HCL 2 MG/2ML IJ SOLN
INTRAMUSCULAR | Status: AC
  Filled 2018-04-22: qty 2

## 2018-04-22 MED ORDER — LOSARTAN POTASSIUM 50 MG PO TABS
50.0000 mg | ORAL_TABLET | Freq: Every day | ORAL | Status: DC
  Administered 2018-04-22 – 2018-04-23 (×2): 50 mg via ORAL
  Filled 2018-04-22 (×2): qty 1

## 2018-04-22 MED ORDER — LABETALOL HCL 5 MG/ML IV SOLN: 10.0000 mg | INTRAVENOUS | Status: AC | PRN

## 2018-04-22 MED ORDER — CLOPIDOGREL BISULFATE 75 MG PO TABS
75.0000 mg | ORAL_TABLET | ORAL | Status: AC
  Administered 2018-04-22: 75 mg via ORAL
  Filled 2018-04-22: qty 1

## 2018-04-22 MED ORDER — SODIUM CHLORIDE 0.9 % IV SOLN
INTRAVENOUS | Status: DC | PRN
  Administered 2018-04-22: 1.75 mg/kg/h via INTRAVENOUS

## 2018-04-22 MED ORDER — NORTRIPTYLINE HCL 25 MG PO CAPS
25.0000 mg | ORAL_CAPSULE | Freq: Every day | ORAL | Status: DC
  Administered 2018-04-22: 25 mg via ORAL
  Filled 2018-04-22 (×2): qty 1

## 2018-04-22 MED ORDER — IOHEXOL 350 MG/ML SOLN
INTRAVENOUS | Status: DC | PRN
  Administered 2018-04-22: 70 mL via INTRA_ARTERIAL

## 2018-04-22 MED ORDER — ACETAMINOPHEN 325 MG PO TABS: 650.0000 mg | ORAL_TABLET | ORAL | Status: DC | PRN

## 2018-04-22 MED ORDER — CLOPIDOGREL BISULFATE 300 MG PO TABS
ORAL_TABLET | ORAL | Status: DC | PRN
  Administered 2018-04-22: 75 mg via ORAL

## 2018-04-22 MED ORDER — BIVALIRUDIN TRIFLUOROACETATE 250 MG IV SOLR
INTRAVENOUS | Status: AC
  Filled 2018-04-22: qty 250

## 2018-04-22 MED ORDER — ASPIRIN 81 MG PO CHEW
CHEWABLE_TABLET | ORAL | Status: AC
  Filled 2018-04-22: qty 1

## 2018-04-22 MED ORDER — NITROGLYCERIN 1 MG/10 ML FOR IR/CATH LAB
INTRA_ARTERIAL | Status: AC
  Filled 2018-04-22: qty 10

## 2018-04-22 MED ORDER — NITROGLYCERIN 1 MG/10 ML FOR IR/CATH LAB
INTRA_ARTERIAL | Status: DC | PRN
  Administered 2018-04-22: 200 ug via INTRACORONARY

## 2018-04-22 MED ORDER — CLOPIDOGREL BISULFATE 75 MG PO TABS
ORAL_TABLET | ORAL | Status: AC
  Filled 2018-04-22: qty 1

## 2018-04-22 MED ORDER — SODIUM CHLORIDE 0.9% FLUSH
3.0000 mL | Freq: Two times a day (BID) | INTRAVENOUS | Status: DC
  Administered 2018-04-22 – 2018-04-23 (×2): 3 mL via INTRAVENOUS

## 2018-04-22 MED ORDER — SODIUM CHLORIDE 0.9 % WEIGHT BASED INFUSION
3.0000 mL/kg/h | INTRAVENOUS | Status: DC
  Administered 2018-04-22: 3 mL/kg/h via INTRAVENOUS

## 2018-04-22 MED ORDER — VITAMIN E 180 MG (400 UNIT) PO CAPS: 400.0000 [IU] | ORAL_CAPSULE | Freq: Every day | ORAL | Status: DC

## 2018-04-22 MED ORDER — FAMOTIDINE IN NACL 20-0.9 MG/50ML-% IV SOLN
INTRAVENOUS | Status: AC
  Filled 2018-04-22: qty 50

## 2018-04-22 MED ORDER — HEPARIN (PORCINE) IN NACL 1000-0.9 UT/500ML-% IV SOLN
INTRAVENOUS | Status: DC | PRN
  Administered 2018-04-22: 500 mL

## 2018-04-22 MED ORDER — SODIUM CHLORIDE 0.9 % IV SOLN
INTRAVENOUS | Status: AC
  Administered 2018-04-22: 14:00:00 via INTRAVENOUS

## 2018-04-22 MED ORDER — SODIUM CHLORIDE 0.9 % IV SOLN: 250.0000 mL | INTRAVENOUS | Status: DC | PRN

## 2018-04-22 MED ORDER — ESCITALOPRAM OXALATE 20 MG PO TABS
20.0000 mg | ORAL_TABLET | Freq: Every day | ORAL | Status: DC
  Administered 2018-04-22: 20 mg via ORAL
  Filled 2018-04-22: qty 2
  Filled 2018-04-22 (×2): qty 1

## 2018-04-22 MED ORDER — HYDRALAZINE HCL 20 MG/ML IJ SOLN: 5.0000 mg | INTRAMUSCULAR | Status: AC | PRN

## 2018-04-22 MED ORDER — SODIUM CHLORIDE 0.9% FLUSH: 3.0000 mL | Freq: Two times a day (BID) | INTRAVENOUS | Status: DC

## 2018-04-22 MED ORDER — FENTANYL CITRATE (PF) 100 MCG/2ML IJ SOLN
INTRAMUSCULAR | Status: DC | PRN
  Administered 2018-04-22: 50 ug via INTRAVENOUS

## 2018-04-22 MED ORDER — LIDOCAINE HCL (PF) 1 % IJ SOLN
INTRAMUSCULAR | Status: DC | PRN
  Administered 2018-04-22: 25 mL

## 2018-04-22 MED ORDER — BISOPROLOL FUMARATE 5 MG PO TABS
5.0000 mg | ORAL_TABLET | Freq: Every day | ORAL | Status: DC
  Administered 2018-04-22 – 2018-04-23 (×2): 5 mg via ORAL
  Filled 2018-04-22 (×2): qty 1

## 2018-04-22 MED ORDER — HEPARIN (PORCINE) IN NACL 1000-0.9 UT/500ML-% IV SOLN
INTRAVENOUS | Status: AC
  Filled 2018-04-22: qty 1000

## 2018-04-22 MED ORDER — HYDROCHLOROTHIAZIDE 12.5 MG PO CAPS
12.5000 mg | ORAL_CAPSULE | Freq: Every day | ORAL | Status: DC
  Filled 2018-04-22: qty 1

## 2018-04-22 MED ORDER — FAMOTIDINE IN NACL 20-0.9 MG/50ML-% IV SOLN
INTRAVENOUS | Status: AC | PRN
  Administered 2018-04-22: 20 mg via INTRAVENOUS

## 2018-04-22 MED ORDER — LEVOTHYROXINE SODIUM 75 MCG PO TABS
175.0000 ug | ORAL_TABLET | Freq: Every day | ORAL | Status: DC
  Administered 2018-04-23: 175 ug via ORAL
  Filled 2018-04-22 (×2): qty 1

## 2018-04-22 MED ORDER — ATORVASTATIN CALCIUM 80 MG PO TABS
80.0000 mg | ORAL_TABLET | Freq: Every day | ORAL | Status: DC
  Filled 2018-04-22: qty 1

## 2018-04-22 MED ORDER — CLOPIDOGREL BISULFATE 75 MG PO TABS
75.0000 mg | ORAL_TABLET | Freq: Every day | ORAL | Status: DC
  Administered 2018-04-23: 75 mg via ORAL
  Filled 2018-04-22: qty 1

## 2018-04-22 MED ORDER — FENTANYL CITRATE (PF) 100 MCG/2ML IJ SOLN
INTRAMUSCULAR | Status: AC
  Filled 2018-04-22: qty 2

## 2018-04-22 MED ORDER — PANTOPRAZOLE SODIUM 40 MG PO TBEC
40.0000 mg | DELAYED_RELEASE_TABLET | Freq: Two times a day (BID) | ORAL | Status: DC
  Administered 2018-04-22 – 2018-04-23 (×2): 40 mg via ORAL
  Filled 2018-04-22 (×2): qty 1

## 2018-04-22 MED ORDER — ONDANSETRON HCL 4 MG/2ML IJ SOLN: 4.0000 mg | Freq: Four times a day (QID) | INTRAMUSCULAR | Status: DC | PRN

## 2018-04-22 MED FILL — Nitroglycerin IV Soln 100 MCG/ML in D5W: INTRA_ARTERIAL | Qty: 10 | Status: AC

## 2018-04-22 MED FILL — Famotidine in NaCl 0.9% IV Soln 20 MG/50ML: INTRAVENOUS | Qty: 50 | Status: CN

## 2018-04-22 SURGICAL SUPPLY — 16 items
BALLN EMERGE MR 2.25X12 (BALLOONS) ×2
BALLN SAPPHIRE ~~LOC~~ 3.25X12 (BALLOONS) ×1 IMPLANT
BALLOON EMERGE MR 2.25X12 (BALLOONS) IMPLANT
CATH LAUNCHER 6FR JR4 (CATHETERS) ×1 IMPLANT
CLOSURE MYNX CONTROL 6F/7F (Vascular Products) ×1 IMPLANT
ELECT DEFIB PAD ADLT CADENCE (PAD) ×1 IMPLANT
KIT ENCORE 26 ADVANTAGE (KITS) ×1 IMPLANT
KIT HEART LEFT (KITS) ×2 IMPLANT
PACK CARDIAC CATHETERIZATION (CUSTOM PROCEDURE TRAY) ×2 IMPLANT
SHEATH PINNACLE 6F 10CM (SHEATH) ×1 IMPLANT
SHEATH PROBE COVER 6X72 (BAG) ×1 IMPLANT
STENT RESOLUTE ONYX 3.0X15 (Permanent Stent) ×1 IMPLANT
TRANSDUCER W/STOPCOCK (MISCELLANEOUS) ×2 IMPLANT
TUBING CIL FLEX 10 FLL-RA (TUBING) ×2 IMPLANT
WIRE EMERALD 3MM-J .035X150CM (WIRE) ×1 IMPLANT
WIRE MINAMO 190 (WIRE) ×1 IMPLANT

## 2018-04-22 NOTE — CV Procedure (Signed)
   Right femoral access using ultrasound guidance.  Closure with Mynx.  15 x 3.0 Onyx postdilated to 3.25 mm in diameter reducing 80% stenosis to less than 10%.  No immediate complications.

## 2018-04-22 NOTE — Interval H&P Note (Signed)
Cath Lab Visit (complete for each Cath Lab visit)  Clinical Evaluation Leading to the Procedure:   ACS: No.  Non-ACS:    Anginal Classification: CCS IV  Anti-ischemic medical therapy: Minimal Therapy (1 class of medications)  Non-Invasive Test Results: No non-invasive testing performed  Prior CABG: No previous CABG      History and Physical Interval Note:  04/22/2018 11:30 AM  [name]  has presented today for surgery, with the diagnosis of cad  The various methods of treatment have been discussed with the patient and family. After consideration of risks, benefits and other options for treatment, the patient has consented to  Procedure(s): CORONARY STENT INTERVENTION (N/A) as a surgical intervention .  The patient's history has been reviewed, patient examined, no change in status, stable for surgery.  I have reviewed the patient's chart and labs.  Questions were answered to the patient's satisfaction.     [name] III

## 2018-04-22 NOTE — Progress Notes (Signed)
[id number] Education completed with pt and husband who voiced understanding. Stressed importance of plavix with stent. Reviewed NTG use, heart healthy and low carb food choices, ex ed and CRP 2. Referred to [place] CRP 2. Graylon Good RN BSN 04/22/2018 2:21 PM

## 2018-04-22 NOTE — Plan of Care (Signed)
  Problem: Education: Goal: Knowledge of General Education information will improve Description Including pain rating scale, medication(s)/side effects and non-pharmacologic comfort measures Outcome: Progressing   

## 2018-04-23 ENCOUNTER — Encounter (HOSPITAL_COMMUNITY): Payer: Self-pay | Admitting: Nurse Practitioner

## 2018-04-23 DIAGNOSIS — I10 Essential (primary) hypertension: Secondary | ICD-10-CM | POA: Diagnosis not present

## 2018-04-23 DIAGNOSIS — E669 Obesity, unspecified: Secondary | ICD-10-CM | POA: Diagnosis not present

## 2018-04-23 DIAGNOSIS — Z955 Presence of coronary angioplasty implant and graft: Secondary | ICD-10-CM | POA: Diagnosis not present

## 2018-04-23 DIAGNOSIS — Z7982 Long term (current) use of aspirin: Secondary | ICD-10-CM | POA: Diagnosis not present

## 2018-04-23 DIAGNOSIS — I080 Rheumatic disorders of both mitral and aortic valves: Secondary | ICD-10-CM | POA: Diagnosis not present

## 2018-04-23 DIAGNOSIS — R0609 Other forms of dyspnea: Secondary | ICD-10-CM

## 2018-04-23 DIAGNOSIS — Z6834 Body mass index (BMI) 34.0-34.9, adult: Secondary | ICD-10-CM | POA: Diagnosis not present

## 2018-04-23 DIAGNOSIS — I447 Left bundle-branch block, unspecified: Secondary | ICD-10-CM | POA: Diagnosis not present

## 2018-04-23 DIAGNOSIS — K589 Irritable bowel syndrome without diarrhea: Secondary | ICD-10-CM | POA: Diagnosis not present

## 2018-04-23 DIAGNOSIS — E039 Hypothyroidism, unspecified: Secondary | ICD-10-CM | POA: Diagnosis not present

## 2018-04-23 DIAGNOSIS — I25118 Atherosclerotic heart disease of native coronary artery with other forms of angina pectoris: Secondary | ICD-10-CM | POA: Diagnosis not present

## 2018-04-23 DIAGNOSIS — E785 Hyperlipidemia, unspecified: Secondary | ICD-10-CM | POA: Diagnosis not present

## 2018-04-23 DIAGNOSIS — M8580 Other specified disorders of bone density and structure, unspecified site: Secondary | ICD-10-CM | POA: Diagnosis not present

## 2018-04-23 DIAGNOSIS — Z79899 Other long term (current) drug therapy: Secondary | ICD-10-CM | POA: Diagnosis not present

## 2018-04-23 DIAGNOSIS — E119 Type 2 diabetes mellitus without complications: Secondary | ICD-10-CM | POA: Diagnosis not present

## 2018-04-23 DIAGNOSIS — K219 Gastro-esophageal reflux disease without esophagitis: Secondary | ICD-10-CM | POA: Diagnosis not present

## 2018-04-23 DIAGNOSIS — I272 Pulmonary hypertension, unspecified: Secondary | ICD-10-CM | POA: Diagnosis not present

## 2018-04-23 DIAGNOSIS — Z888 Allergy status to other drugs, medicaments and biological substances status: Secondary | ICD-10-CM | POA: Diagnosis not present

## 2018-04-23 DIAGNOSIS — M1990 Unspecified osteoarthritis, unspecified site: Secondary | ICD-10-CM | POA: Diagnosis not present

## 2018-04-23 DIAGNOSIS — I6529 Occlusion and stenosis of unspecified carotid artery: Secondary | ICD-10-CM | POA: Diagnosis not present

## 2018-04-23 DIAGNOSIS — Z794 Long term (current) use of insulin: Secondary | ICD-10-CM | POA: Diagnosis not present

## 2018-04-23 DIAGNOSIS — I2510 Atherosclerotic heart disease of native coronary artery without angina pectoris: Secondary | ICD-10-CM | POA: Diagnosis not present

## 2018-04-23 DIAGNOSIS — D563 Thalassemia minor: Secondary | ICD-10-CM | POA: Diagnosis not present

## 2018-04-23 LAB — CBC
HCT: 35 % — ABNORMAL LOW (ref 36.0–46.0)
Hemoglobin: 10.4 g/dL — ABNORMAL LOW (ref 12.0–15.0)
MCH: 19.2 pg — ABNORMAL LOW (ref 26.0–34.0)
MCHC: 29.7 g/dL — ABNORMAL LOW (ref 30.0–36.0)
MCV: 64.5 fL — ABNORMAL LOW (ref 80.0–100.0)
Platelets: 184 10*3/uL (ref 150–400)
RBC: 5.43 MIL/uL — ABNORMAL HIGH (ref 3.87–5.11)
RDW: 15.8 % — ABNORMAL HIGH (ref 11.5–15.5)
WBC: 6.6 10*3/uL (ref 4.0–10.5)
nRBC: 0 % (ref 0.0–0.2)

## 2018-04-23 LAB — BASIC METABOLIC PANEL
Anion gap: 8 (ref 5–15)
BUN: 10 mg/dL (ref 8–23)
CO2: 27 mmol/L (ref 22–32)
Calcium: 9.5 mg/dL (ref 8.9–10.3)
Chloride: 104 mmol/L (ref 98–111)
Creatinine, Ser: 0.76 mg/dL (ref 0.44–1.00)
GFR calc Af Amer: >60 mL/min (ref >60)
GFR calc non Af Amer: >60 mL/min (ref >60)
Glucose, Bld: 163 mg/dL — ABNORMAL HIGH (ref 70–99)
Potassium: 3.9 mmol/L (ref 3.5–5.1)
Sodium: 139 mmol/L (ref 135–145)

## 2018-04-23 LAB — GLUCOSE, CAPILLARY: Glucose-Capillary: 140 mg/dL — ABNORMAL HIGH (ref 70–99)

## 2018-04-23 MED ORDER — NITROGLYCERIN 0.4 MG SL SUBL: 0.4000 mg | SUBLINGUAL_TABLET | SUBLINGUAL | 3 refills | Status: DC | PRN

## 2018-04-23 MED ORDER — CLOPIDOGREL BISULFATE 75 MG PO TABS: 75.0000 mg | ORAL_TABLET | Freq: Every day | ORAL | 6 refills | Status: DC

## 2018-04-23 NOTE — Progress Notes (Signed)
Progress Note  Patient Name: Breanna Webster Date of Encounter: 04/23/2018  Primary Cardiologist: Sinclair Grooms, MD   Subjective   Doing well without any groin discomfort or bleeding overnight.  Multiple questions answered.  Inpatient Medications    Scheduled Meds: . aspirin EC  81 mg Oral Daily  . atorvastatin  80 mg Oral q1800  . bisoprolol  5 mg Oral Daily  . clopidogrel  75 mg Oral Q breakfast  . escitalopram  20 mg Oral Daily  . heparin  5,000 Units Subcutaneous Q8H  . hydrochlorothiazide  12.5 mg Oral Daily  . insulin aspart  0-20 Units Subcutaneous TID WC  . insulin aspart  0-5 Units Subcutaneous QHS  . levothyroxine  175 mcg Oral QAC breakfast  . losartan  50 mg Oral Daily  . nortriptyline  25 mg Oral QHS  . pantoprazole  40 mg Oral BID  . sodium chloride flush  3 mL Intravenous Q12H   Continuous Infusions: . sodium chloride     PRN Meds: sodium chloride, acetaminophen, ondansetron (ZOFRAN) IV, sodium chloride flush   Vital Signs    Vitals:   04/22/18 1610 04/22/18 1825 04/22/18 2102 04/23/18 0314  BP: 133/63 (!) 154/82 (!) 143/65 (!) 147/71  Pulse: 66 75 70 80  Resp: 15 (!) 26 14 15   Temp:   98.5 F (36.9 C) 98.9 F (37.2 C)  TempSrc:   Oral Oral  SpO2: 96% 97% 99% 92%  Weight:    93.7 kg  Height:        Intake/Output Summary (Last 24 hours) at 04/23/2018 0903 Last data filed at 04/22/2018 2105 Gross per 24 hour  Intake 729.7 ml  Output -  Net 729.7 ml   Filed Weights   04/22/18 1003 04/23/18 0314  Weight: 90.7 kg 93.7 kg    Telemetry    Normal sinus rhythm with wide complex consistent with bundle branch block.- Personally Reviewed  ECG    Normal sinus rhythm, left atrial abnormality, and left bundle branch block.- Personally Reviewed  Physical Exam  Stable appearing with no evidence of pallor GEN: No acute distress.   Neck: No JVD Cardiac: RRR, no murmurs, rubs, or gallops.  Vascular: No hematoma.  2+ femoral pulse at site of  procedure.  Bilateral radial pulses are 2+ and symmetric. Respiratory: Clear to auscultation bilaterally. GI: Soft, nontender, non-distended  MS: No edema; No deformity. Neuro:  Nonfocal  Psych: Very anxious.  Labs    Chemistry Recent Labs  Lab 04/22/18 1004 04/23/18 0430  NA 140 139  K 5.4* 3.9  CL 103 104  CO2 25 27  GLUCOSE 112* 163*  BUN 14 10  CREATININE 0.85 0.76  CALCIUM 9.6 9.5  GFRNONAA >60 >60  GFRAA >60 >60  ANIONGAP 12 8     Hematology Recent Labs  Lab 04/22/18 1004 04/23/18 0430  WBC 7.0 6.6  RBC 5.98* 5.43*  HGB 11.5* 10.4*  HCT 39.2 35.0*  MCV 65.6* 64.5*  MCH 19.2* 19.2*  MCHC 29.3* 29.7*  RDW 17.3* 15.8*  PLT 205 184    Cardiac EnzymesNo results for input(s): TROPONINI in the last 168 hours. No results for input(s): TROPIPOC in the last 168 hours.   BNPNo results for input(s): BNP, PROBNP in the last 168 hours.   DDimer No results for input(s): DDIMER in the last 168 hours.   Radiology    No results found.  Cardiac Studies   Coronary percutaneous intervention 04/22/2018: Diagnostic  Dominance: Right  Intervention      Successful stent mid RCA reducing 85% stenosis to less than 10% using a 3.0 x 15 Onyx postdilated to 3.25 mm diameter.  Stenosis was reduced to less than 10%.  Mynx closure with success.  RECOMMENDATIONS:   Discharge in a.m.  Continue all other medications as previous.  Aspirin and Plavix, uninterrupted for 6 months.     Patient Profile     73 y.o. female with hx of pulmonary hypertension, DM II, moderate aortic stenosis, LBBB, GERD, and reactive airways disease who was found at cath in October 20199, to have 80-90% mid RCA stenosis.  Successful RCA stent on 04/22/2018 from femoral approach after failed attempt on 04/21/2018 from right radial approach.  No post procedure complications.  Assessment & Plan    1. Dyspnea on exertion, probably multifactorial.  Concern was that dyspnea could be an  ischemic equivalent. 2. CAD with 85% proximal to mid RCA reduced to 0% with stenting using Onyx DES with excellent result.  Plan 6 months of dual antiplatelet therapy using aspirin and Plavix. 3. Obesity 4. Hyperlipidemia, LDL target less than 70.   Plan:   Discharge today.  Continue medications as prior to admission with the addition of clopidogrel 75 mg/day and aspirin 81 mg/day.  Outpatient follow-up with me in 2 weeks and if still significantly dyspneic, will refer her back to pulmonary and we may need to consider performing a cardiopulmonary stress test to discern if her limitations are conditioning related.  She has moderate aortic stenosis but based upon evaluation by the valve team, does not have paradoxical low flow low output critical aortic stenosis.  For questions or updates, please contact [place] Please consult [email or web address] for contact info under        Signed, Sinclair Grooms, MD  04/23/2018, 9:03 AM

## 2018-04-23 NOTE — Discharge Summary (Signed)
Discharge Summary    Patient ID: Breanna Webster MRN: [id number]; DOB: 1945/11/08  Admit date: 04/22/2018 Discharge date: 04/23/2018  Primary Care Provider: [name], NP  Primary Cardiologist: Sinclair Grooms, MD   Discharge Diagnoses    Principal Problem:   CAD (coronary artery disease), native coronary artery  **Status post PCI and drug-eluting stent placement to the right coronary artery this admission.  Active Problems:   Chest tightness   Dyspnea on exertion   Essential hypertension   Hypothyroidism   Dyslipidemia   Obesity   Moderate aortic valve stenosis   Pulmonary hypertension (HCC)  Allergies Allergies  Allergen Reactions  . Statins Other (See Comments)    Muscle aches    Diagnostic Studies/Procedures    Cardiac Catheterization and Percutaneous Coronary Intervention 12.31.2019  Right Coronary Artery  Prox RCA to Mid RCA lesion 85% stenosed  Prox RCA to Mid RCA lesion is 85% stenosed.   **The right coronary artery was successfully stented using a 3.0 x 15 mm onyx drug-eluting stent. _____________   History of Present Illness     73 year old female with a history of pulmonary hypertension, type 2 diabetes mellitus, moderate aortic stenosis, left bundle branch block, GERD, and reactive airway disease who has recently been evaluated due to persistent dyspnea on exertion.  As part of work-up for dyspnea on exertion, she underwent diagnostic cardiac catheterization on October 31 which revealed an 80 to 90% mid RCA stenosis.  She was also noted pulmonary hypertension and moderate aortic stenosis and as a result, she was evaluated by pulmonology in the structural heart team.  She was not felt to have paradoxical low flow low gradient aortic stenosis and pulmonology cannot explain dyspnea on the basis of pulmonary disease.  As result, recommendation was made for treatment of her right coronary artery disease.  She initially presented to the [facility] cardiac  catheterization laboratory on December 30 however, radial access was complicated by significant spasm and the case was aborted with plan for PCI via a femoral approach on December 31.  Hospital Course     Consultants: None  Patient presented to the [facility], cardiac catheterization laboratory and underwent cardiac catheterization via the femoral approach on December 31.  The right coronary artery had persistent significant stenosis in the proximal and mid sections in this area was successfully treated with a 3.0 x 15 mm onyx drug-eluting stent.  She tolerated the procedure well and post procedure has been ambulating without symptoms or limitations.  She will be discharged home today in good condition with plans for follow-up in approximately 2 weeks.  If dyspnea persists at that time, we will likely plan to refer back to pulmonology and also consider performing cardiopulmonary stress testing to determine if limitations are conditioning related.  Of note, patient has prior intolerance to multiple statins and also Zetia.  We will need to consider referral to lipid clinic for initiation of PCSK9 inhibitor therapy as an outpatient. _____________  Discharge Vitals Blood pressure (!) 147/71, pulse 80, temperature 98.9 F (37.2 C), temperature source Oral, resp. rate 15, height 5' 5"  (1.651 m), weight 93.7 kg, SpO2 92 %.  Filed Weights   04/22/18 1003 04/23/18 0314  Weight: 90.7 kg 93.7 kg    Labs & Radiologic Studies    CBC Recent Labs    04/22/18 1004 04/23/18 0430  WBC 7.0 6.6  HGB 11.5* 10.4*  HCT 39.2 35.0*  MCV 65.6* 64.5*  PLT 205 032   Basic Metabolic Panel  Recent Labs    04/22/18 1004 04/23/18 0430  NA 140 139  K 5.4* 3.9  CL 103 104  CO2 25 27  GLUCOSE 112* 163*  BUN 14 10  CREATININE 0.85 0.76  CALCIUM 9.6 9.5   Disposition   Pt is being discharged home today in good condition.  Follow-up Plans & Appointments    Follow-up Information    [name], MD Follow  up.   Specialty:  Cardiology Why:  We will arrange for a follow-up appointment in approximately 2 weeks and contact you. Contact information: [address] [place] [address] [phone]          Discharge Instructions    Amb Referral to Cardiac Rehabilitation   Complete by:  As directed    Diagnosis:  Coronary Stents      Discharge Medications   Allergies as of 04/23/2018      Reactions   Statins Other (See Comments)   Muscle aches      Medication List    STOP taking these medications   vitamin E 400 UNIT capsule     TAKE these medications   aspirin 81 MG tablet Take 81 mg by mouth daily.   bisoprolol 5 MG tablet Commonly known as:  ZEBETA Take 1 tablet (5 mg total) by mouth daily.   CHLOR-TRIMETON 4 MG tablet Generic drug:  chlorpheniramine Take 4 mg by mouth every 4 (four) hours as needed for allergies.   cholecalciferol 1000 units tablet Commonly known as:  VITAMIN D Take 1,000 Units by mouth daily.   clopidogrel 75 MG tablet Commonly known as:  PLAVIX Take 1 tablet (75 mg total) by mouth daily with breakfast. Start taking on:  April 24, 2018   escitalopram 20 MG tablet Commonly known as:  LEXAPRO TAKE 1 TABLET BY MOUTH ONCE DAILY   hydrochlorothiazide 12.5 MG tablet Commonly known as:  HYDRODIURIL TAKE 1 TABLET(12.5 MG) BY MOUTH DAILY What changed:  See the new instructions.   insulin NPH Human 100 UNIT/ML injection Commonly known as:  NOVOLIN N RELION Inject 1.2 mLs (120 Units total) into the skin every morning. And syringes 1/day   Insulin Syringe-Needle U-100 31G X 5/16" 1 ML Misc Commonly known as:  BD INSULIN SYRINGE U/F USE TO INJECT INSULIN ONCE DAILY   losartan 50 MG tablet Commonly known as:  COZAAR Take 1 tablet (50 mg total) by mouth daily.   multivitamin with minerals Tabs tablet Take 1 tablet by mouth daily.   nitroGLYCERIN 0.4 MG SL tablet Commonly known as:  NITROSTAT Place 1 tablet (0.4 mg total)  under the tongue every 5 (five) minutes as needed for chest pain.   nortriptyline 25 MG capsule Commonly known as:  PAMELOR Take 1 capsule (25 mg total) by mouth at bedtime.   ONETOUCH DELICA LANCETS 14J Misc USE TO CHECK BLOOD SUGAR TWICE DAILY   ONETOUCH VERIO test strip Generic drug:  glucose blood USE TO CHECK BLOOD SUGAR TWICE DAILY   pantoprazole 40 MG tablet Commonly known as:  PROTONIX Take 1 tablet (40 mg total) by mouth 2 (two) times daily.   SYNTHROID 175 MCG tablet Generic drug:  levothyroxine TAKE 1 TABLET BY MOUTH ONCE DAILY.         Outstanding Labs/Studies   None  Duration of Discharge Encounter   Greater than 30 minutes including physician time.  Signed, Murray Hodgkins, NP 04/23/2018, 11:10 AM

## 2018-04-23 NOTE — Discharge Instructions (Signed)
**PLEASE REMEMBER TO BRING ALL OF YOUR MEDICATIONS TO EACH OF YOUR FOLLOW-UP OFFICE VISITS.  ° °NO HEAVY LIFTING OR SEXUAL ACTIVITY X 7 DAYS. °NO DRIVING X 3-5 DAYS. °NO SOAKING BATHS, HOT TUBS, POOLS, ETC., X 7 DAYS. ° °Groin Site Care °Refer to this sheet in the next few weeks. These instructions provide you with information on caring for yourself after your procedure. Your caregiver may also give you more specific instructions. Your treatment has been planned according to current medical practices, but problems sometimes occur. Call your caregiver if you have any problems or questions after your procedure. °HOME CARE INSTRUCTIONS °· You may shower 24 hours after the procedure. Remove the bandage (dressing) and gently wash the site with plain soap and water. Gently pat the site dry.  °· Do not apply powder or lotion to the site.  °· Do not sit in a bathtub, swimming pool, or whirlpool for 5 to 7 days.  °· No bending, squatting, or lifting anything over 10 pounds (4.5 kg) as directed by your caregiver.  °· Inspect the site at least twice daily.  °· Do not drive home if you are discharged the same day of the procedure. Have someone else drive you.  ° °What to expect: °· Any bruising will usually fade within 1 to 2 weeks.  °· Blood that collects in the tissue (hematoma) may be painful to the touch. It should usually decrease in size and tenderness within 1 to 2 weeks.  °SEEK IMMEDIATE MEDICAL CARE IF: °· You have unusual pain at the groin site or down the affected leg.  °· You have redness, warmth, swelling, or pain at the groin site.  °· You have drainage (other than a small amount of blood on the dressing).  °· You have chills.  °· You have a fever or persistent symptoms for more than 72 hours.  °· You have a fever and your symptoms suddenly get worse.  °· Your leg becomes pale, cool, tingly, or numb.  °You have heavy bleeding from the site. Hold pressure on the site. . °_____________ ° °  ° °10 Habits of Highly  Healthy People ° °[facility] wants to help you get well and stay well.  Live a longer, healthier life by practicing healthy habits every day. ° °1.  Visit your primary care provider regularly. °2.  Make time for family and friends.  Healthy relationships are important. °3.  Take medications as directed by your provider. °4.  Maintain a healthy weight and a trim waistline. °5.  Eat healthy meals and snacks, rich in fruits, vegetables, whole grains, and lean proteins. °6.  Get moving every day - aim for 150 minutes of moderate physical activity each week. °7.  Don't smoke. °8.  Avoid alcohol or drink in moderation. °9.  Manage stress through meditation or mindful relaxation. °10.  Get seven to nine hours of quality sleep each night. ° °Want more information on healthy habits?  To learn more about these and other healthy habits, visit [email or web address]. °_____________ °  °   ° °You have received care from [facility] during this hospital stay and we look forward to continuing to provide you with excellent care in our office settings after you've left the hospital.  In order to assure a smoother transition to home following your discharge from the hospital, we will likely have you see one of our nurse practitioners or physician assistants within a few weeks of discharge.  Our advanced   practice providers work closely with your physician in order to address all of your heart's needs in a timely manner.  More information about all of our providers may be found here: [email or web address] ° °Please plan to bring all of your prescriptions to your follow-up appointment and don't hesitate to contact us with questions or concerns. ° °[facility] - [phone] °[facility] - [phone] °[facility] [address] - [phone] °[facility] - [phone] °[facility] - [phone] °[facility] - [phone] °[facility] - [phone] °[facility] - [phone] °[facility] - [phone]  °

## 2018-04-24 MED FILL — Lidocaine HCl Local Preservative Free (PF) Inj 1%: INTRAMUSCULAR | Qty: 30 | Status: AC

## 2018-04-27 ENCOUNTER — Encounter: Payer: Self-pay | Admitting: Gastroenterology

## 2018-04-30 ENCOUNTER — Telehealth (HOSPITAL_COMMUNITY): Payer: Self-pay

## 2018-04-30 DIAGNOSIS — R140 Abdominal distension (gaseous): Secondary | ICD-10-CM | POA: Diagnosis not present

## 2018-04-30 NOTE — Telephone Encounter (Signed)
Pt insurance is active and benefits verified through Medicare A/B. Co-pay $0.00, DED $198.00/$0.00 met, out of pocket $0.00/$0.00 met, co-insurance 20%.  No pre-authorization required. Passport, 04/30/2018 @ 11:21AM, REF# [id number]  2ndary insurance is active and benefits verified through [facility]. Co-pay $0.00, DED $0.00/$0.00 met, out of pocket $0.00/$0.00 met, co-insurance 0%.  No pre-authorization required.

## 2018-05-06 NOTE — Progress Notes (Signed)
Cardiology Office Note:    Date:  05/07/2018   ID:  Breanna Webster, DOB Aug 24, 1945, MRN [id number]  PCP:  [name], NP  Cardiologist:  Sinclair Grooms, MD   Referring MD: [name], NP   Chief Complaint  Patient presents with  . Coronary Artery Disease  . Cardiac Valve Problem    Aortic stenosis    History of Present Illness:    Breanna Webster is a 73 y.o. female with a hx of dyspnea on exertion, aortic stenosis, left bundle branch block, diabetes mellitus II, hyperlipidemia, gastroesophageal reflux, and CAD with recent RCA DES January 2020.  Returns today to follow-up dyspnea on exertion and to determine if coronary PCI has brought about any improvement.  Still markedly limited by dyspnea and fatigue despite PCI and stent of the right coronary.  She does not currently have a specific pulmonary diagnosis.  Discussed with Dr. Christinia Gully who recommends a cardiopulmonary function test to differentiate between pulmonary versus cardiovascular cause for dyspnea.  She has not heard back from Dr. Melvyn Novas at this point.  Will refer the patient back to the heart valve team to consider TAVR.  Dr. Angelena Form has reviewed hemodynamics of recent cath and felt that right coronary should be stented first.  Symptoms have not been altered by right coronary intervention.  Dr. Angelena Form did not feel hemodynamics and echo suggested paradoxical low flow low gradient severe aortic stenosis.  Systolic volume index was also felt to be above the cut off to confirm the diagnosis.   Past Medical History:  Diagnosis Date  . Arthritis    "back" (04/22/2018)  . CAD (coronary artery disease)    a. 03/2018 s/p PCI/DES to the RCA (3.0x15 Onyx DES).  . Carotid artery stenosis    Mild  . Chronic lower back pain   . Colon polyps   . Diverticulitis   . Diverticulosis   . GERD (gastroesophageal reflux disease)   . Grave's disease   . Heart murmur    mitral prolapse  . History of colonic polyps  05/22/2017  . History of hiatal hernia   . Hypertension   . Hypothyroidism   . IBS (irritable bowel syndrome)   . Osteopenia   . Pneumonia    "I've had it 3 times" (04/22/2018)  . Thalassemia minor   . Type II diabetes mellitus ([place])     Past Surgical History:  Procedure Laterality Date  . [place] STUDY N/A 03/03/2018   Procedure: [place] STUDY;  Surgeon: Mauri Pole, MD;  Location: [facility];  Service: Endoscopy;  Laterality: N/A;  . COLONOSCOPY W/ BIOPSIES AND POLYPECTOMY    . CORONARY ANGIOGRAPHY Right 04/21/2018   Procedure: CORONARY ANGIOGRAPHY (CATH LAB);  Surgeon: [name], MD;  Location: [facility];  Service: Cardiovascular;  Laterality: Right;  . CORONARY STENT INTERVENTION N/A 04/22/2018   Procedure: CORONARY STENT INTERVENTION;  Surgeon: [name], MD;  Location: [facility];  Service: Cardiovascular;  Laterality: N/A;  . DILATION AND CURETTAGE OF UTERUS    . ESOPHAGEAL MANOMETRY N/A 03/03/2018   Procedure: ESOPHAGEAL MANOMETRY (EM);  Surgeon: Mauri Pole, MD;  Location: [facility];  Service: Endoscopy;  Laterality: N/A;  . HERNIA REPAIR    . HYSTEROSCOPY     fibroids  . LAPAROSCOPIC CHOLECYSTECTOMY    . LAPAROSCOPY     fibroids  . NISSEN FUNDOPLICATION  3220U  . RIGHT/LEFT HEART CATH AND CORONARY ANGIOGRAPHY N/A  02/20/2018   Procedure: RIGHT/LEFT HEART CATH AND CORONARY ANGIOGRAPHY;  Surgeon: [name], MD;  Location: [facility];  Service: Cardiovascular;  Laterality: N/A;  . TONSILLECTOMY      Current Medications: Current Meds  Medication Sig  . aspirin 81 MG tablet Take 81 mg by mouth daily.    . bisoprolol (ZEBETA) 5 MG tablet Take 1 tablet (5 mg total) by mouth daily.  . chlorpheniramine (CHLOR-TRIMETON) 4 MG tablet Take 4 mg by mouth every 4 (four) hours as needed for allergies.  . cholecalciferol (VITAMIN D) 1000 units tablet Take 1,000 Units by mouth daily.  . clopidogrel (PLAVIX) 75 MG  tablet Take 1 tablet (75 mg total) by mouth daily with breakfast.  . escitalopram (LEXAPRO) 20 MG tablet TAKE 1 TABLET BY MOUTH ONCE DAILY  . hydrochlorothiazide (MICROZIDE) 12.5 MG capsule Take 12.5 mg by mouth daily.  . insulin NPH Human (NOVOLIN N RELION) 100 UNIT/ML injection Inject 1.2 mLs (120 Units total) into the skin every morning. And syringes 1/day  . Insulin Syringe-Needle U-100 (BD INSULIN SYRINGE U/F) 31G X 5/16" 1 ML MISC USE TO INJECT INSULIN ONCE DAILY  . losartan (COZAAR) 50 MG tablet Take 1 tablet (50 mg total) by mouth daily.  . Multiple Vitamin (MULITIVITAMIN WITH MINERALS) TABS Take 1 tablet by mouth daily.  . nitroGLYCERIN (NITROSTAT) 0.4 MG SL tablet Place 1 tablet (0.4 mg total) under the tongue every 5 (five) minutes as needed for chest pain.  . nortriptyline (PAMELOR) 25 MG capsule Take 1 capsule (25 mg total) by mouth at bedtime.  [name] DELICA LANCETS 31R MISC USE TO CHECK BLOOD SUGAR TWICE DAILY  . ONETOUCH VERIO test strip USE TO CHECK BLOOD SUGAR TWICE DAILY  . pantoprazole (PROTONIX) 40 MG tablet Take 1 tablet (40 mg total) by mouth 2 (two) times daily.  Marland Kitchen SYNTHROID 175 MCG tablet TAKE 1 TABLET BY MOUTH ONCE DAILY.     Allergies:   Statins   Social History   Socioeconomic History  . Marital status: Married    Spouse name: Not on file  . Number of children: 2  . Years of education: Not on file  . Highest education level: Not on file  Occupational History  . Occupation: Retired  Scientific laboratory technician  . Financial resource strain: Not on file  . Food insecurity:    Worry: Not on file    Inability: Not on file  . Transportation needs:    Medical: Not on file    Non-medical: Not on file  Tobacco Use  . Smoking status: Never Smoker  . Smokeless tobacco: Never Used  Substance and Sexual Activity  . Alcohol use: No    Alcohol/week: 0.0 standard drinks  . Drug use: Never  . Sexual activity: Not Currently  Lifestyle  . Physical activity:    Days per  week: Not on file    Minutes per session: Not on file  . Stress: Not on file  Relationships  . Social connections:    Talks on phone: Not on file    Gets together: Not on file    Attends religious service: Not on file    Active member of club or organization: Not on file    Attends meetings of clubs or organizations: Not on file    Relationship status: Not on file  Other Topics Concern  . Not on file  Social History Narrative   Lives with husband in a one story home.  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   Education: college.     Family History: The patient's family history includes Headache in an other family member; Healthy in her son. There is no history of Colon cancer, Pancreatic cancer, Rectal cancer, or Stomach cancer. She was adopted.  ROS:   Please see the history of present illness.    Back pain, muscle pain, snoring, wheezing, exertional fatigue, and limiting shortness.  All other systems reviewed and are negative.  EKGs/Labs/Other Studies Reviewed:    The following studies were reviewed today: Cardiac catheterization February 20, 2018: Conclusion    70 to 80% mid RCA  Widely patent LAD, left main, and circumflex.  Normal LV systolic function.  LVEDP normal at 15 mmHg.  Estimated EF 55%.  Moderately severe pulmonary hypertension with PA systolic pressure 66 mmHg and PA mean pressure 41 mmHg.  Pulmonary capillary wedge mean pressure 18 mmHg  Right atrial mean pressure 11 mmHg  Mild to moderate aortic stenosis with calculated aortic valve area 1.21 cm based upon a peak to peak gradient of 21 mmHg and mean gradient of 18 mmHg.  This would suggest that the patient's aortic valve is probably not playing a major role in production of pulmonary hypertension or dyspnea.  RECOMMENDATIONS:   Exclude recurrent pulmonary emboli.  Exclude other causes of pulmonary hypertension such as  connective tissue disease.  Consider obstructive sleep apnea.  Eventual PCI with stenting of the right coronary.  This should probably be done after the patient has had what ever pulmonary and GI procedures.  Stenting would prevent discontinuation of dual antiplatelet therapy for at least 6 months and possibly a year.  Plan to speak with providers involved and make a decision about when to proceed with PCI.  Aortic valve is not severely involved enough to recommend therapy at this time.   PCI April 22, 2018:  Successful stent mid RCA reducing 85% stenosis to less than 10% using a 3.0 x 15 Onyx postdilated to 3.25 mm diameter.  Stenosis was reduced to less than 10%.  Mynx closure with success.   Most recent 2D Doppler echocardiogram performed August 2019: Study Conclusions  - Procedure narrative: Transthoracic echocardiography. Image   quality was suboptimal. The study was technically difficult, as a   result of poor acoustic windows, poor sound wave transmission,   and body habitus. Intravenous contrast (Definity) was   administered. - Left ventricle: The cavity size was normal. Systolic function was   vigorous. The estimated ejection fraction was in the range of 65%   to 70%. Wall motion was normal; there were no regional wall   motion abnormalities. Doppler parameters are consistent with   abnormal left ventricular relaxation (grade 1 diastolic   dysfunction). - Aortic valve: Trileaflet; moderately thickened, moderately   calcified leaflets. Valve mobility was restricted. There was   moderate stenosis. Peak velocity (S): 346 cm/s. Mean gradient   (S): 26 mm Hg. Peak gradient (S): 48 mm Hg. Valve area (VTI):   1.19 cm^2. - Mitral valve: Severely calcified annulus. Valve area by pressure   half-time: 2.44 cm^2. - Left atrium: The atrium was mildly dilated. Volume/bsa, ES,   (1-plane Simpson&'s, A2C): 41.3 ml/m^2.  Impressions:  - Aortic stenosis has advanced when  compared to prior.  ------------------------------------------------------------------- Labs, prior tests, procedures, and surgery: Transthoracic echocardiography (12/14/2015).    The aortic valve showed mild stenosis.  EF was 60%. Aortic valve:  peak gradient of 29 mm Hg and mean gradient of 15 mm Hg.   EKG:  EKG is not done.  Recent Labs: 06/14/2017: ALT 32 08/05/2017: TSH 0.79 02/19/2018: NT-Pro BNP 270 04/23/2018: BUN 10; Creatinine, Ser 0.76; Hemoglobin 10.4; Platelets 184; Potassium 3.9; Sodium 139  Recent Lipid Panel    Component Value Date/Time   CHOL 159 06/14/2017 0826   CHOL 156 11/21/2016 1113   TRIG 123.0 06/14/2017 0826   HDL 41.20 06/14/2017 0826   HDL 44 11/21/2016 1113   CHOLHDL 4 06/14/2017 0826   VLDL 24.6 06/14/2017 0826   LDLCALC 93 06/14/2017 0826   LDLCALC 91 11/21/2016 1113   LDLDIRECT 98.0 05/24/2014 1343    Physical Exam:    VS:  BP 138/62   Pulse 85   Ht 5' 5"  (1.651 m)   Wt 205 lb 12.8 oz (93.4 kg)   SpO2 96%   BMI 34.25 kg/m     Wt Readings from Last 3 Encounters:  05/07/18 205 lb 12.8 oz (93.4 kg)  04/23/18 206 lb 8 oz (93.7 kg)  04/21/18 200 lb (90.7 kg)     GEN: Road obesity.. No acute distress HEENT: Normal NECK: No JVD. LYMPHATICS: No lymphadenopathy CARDIAC: RRR.  3/6 systolic crescendo decrescendo right upper sternal border and left lower sternal border aortic stenosis murmur.  No gallop or edema VASCULAR: Pulses reveals 2+ bilateral carotid and bilateral radial., Bruits are transmitted to the base of the neck bilaterally, transmitted from the aortic valve. RESPIRATORY:  Clear to auscultation without rales, wheezing or rhonchi  ABDOMEN: Soft, non-tender, non-distended, No pulsatile mass, MUSCULOSKELETAL: No deformity  SKIN: Warm and dry NEUROLOGIC:  Alert and oriented x 3 PSYCHIATRIC:  Normal affect   ASSESSMENT:    1. Coronary artery disease of native artery of native heart with stable angina pectoris ([place])   2. Essential  hypertension, benign   3. Controlled type 2 diabetes mellitus with diabetic nephropathy, without long-term current use of insulin ([place])   4. Left bundle branch block   5. Dyspnea on exertion   6. Aortic valve stenosis, etiology of cardiac valve disease unspecified    PLAN:    In order of problems listed above:  1. No improvement in exertional dyspnea after RCA stent implantation.  Dyspnea was not an ischemic equivalent. 2. Blood pressure control is adequate.  130/80 mmHg or less.  Care in this situation given aortic stenosis. 3. Consider SGLT2 therapy given dyspnea and possible diastolic heart failure. 4. Not reassessed 5. Continues to be a significant issue.  The right coronary has been stented without improvement in dyspnea and therefore this is nonischemic equivalent.  The patient's dyspnea is probably multifactorial, and a combination of moderate least severe aortic stenosis and possibly some underlying pulmonary disorder that has not yet been characterized.  Will rule out paradoxical low flow low gradient severe aortic stenosis with a dobutamine stress echo.  The patient is not able to walk sufficiently on a treadmill to perform a stress echo.  I have spoken with pulmonary who have recommended consideration of CPX which was to be set up by Dr. Melvyn Novas.  I will go ahead and refer to the heart valve clinic, Dr. Angelena Form, to get a formal recommendation concerning the valve. 6. Aortic stenosis is in the moderate range based upon hemodynamics and echo.  Need to exclude paradoxical low flow/low gradient severe aortic stenosis.  Clinical cardiology follow-up with me in 4 to 6 months.  Can be seen earlier depending upon  response of consultants and results of the dobutamine stress echo.  Greater than 50% of the time during this office visit was spent in education, counseling, and coordination of care related to underlying disease process and testing as outlined.  The coordination component was very  difficult in this case with reference to scheduling the dobutamine stress echo.  Very time-consuming.    Medication Adjustments/Labs and Tests Ordered: Current medicines are reviewed at length with the patient today.  Concerns regarding medicines are outlined above.  Orders Placed This Encounter  Procedures  . Ambulatory referral to Structural Heart/Valve Clinic (only at [place])  . ECHOCARDIOGRAM STRESS TEST   No orders of the defined types were placed in this encounter.   Patient Instructions  Medication Instructions:  Your physician recommends that you continue on your current medications as directed. Please refer to the Current Medication list given to you today.  If you need a refill on your cardiac medications before your next appointment, please call your pharmacy.   Lab work: None ordered If you have labs (blood work) drawn today and your tests are completely normal, you will receive your results only by: Marland Kitchen MyChart Message (if you have MyChart) OR . A paper copy in the mail If you have any lab test that is abnormal or we need to change your treatment, we will call you to review the results.  Testing/Procedures: Your physician has requested that you have a dobutamine stress echocardiogram for aortic stenosis at [facility]. For further information please visit [email or web address]. Please follow instruction sheet as given.  Follow-Up: . You have been referred to see Dr. Angelena Form in the TAVR clinic .    Any Other Special Instructions Will Be Listed Below (If Applicable).     Signed, Sinclair Grooms, MD  05/07/2018 12:53 PM    [place]

## 2018-05-07 ENCOUNTER — Ambulatory Visit (INDEPENDENT_AMBULATORY_CARE_PROVIDER_SITE_OTHER): Payer: Medicare Other | Admitting: Interventional Cardiology

## 2018-05-07 ENCOUNTER — Encounter: Payer: Self-pay | Admitting: Interventional Cardiology

## 2018-05-07 VITALS — BP 138/62 | HR 85 | Ht 65.0 in | Wt 205.8 lb

## 2018-05-07 DIAGNOSIS — R0609 Other forms of dyspnea: Secondary | ICD-10-CM

## 2018-05-07 DIAGNOSIS — I25118 Atherosclerotic heart disease of native coronary artery with other forms of angina pectoris: Secondary | ICD-10-CM

## 2018-05-07 DIAGNOSIS — I10 Essential (primary) hypertension: Secondary | ICD-10-CM | POA: Diagnosis not present

## 2018-05-07 DIAGNOSIS — E1121 Type 2 diabetes mellitus with diabetic nephropathy: Secondary | ICD-10-CM

## 2018-05-07 DIAGNOSIS — I350 Nonrheumatic aortic (valve) stenosis: Secondary | ICD-10-CM

## 2018-05-07 DIAGNOSIS — I447 Left bundle-branch block, unspecified: Secondary | ICD-10-CM

## 2018-05-07 DIAGNOSIS — R0600 Dyspnea, unspecified: Secondary | ICD-10-CM

## 2018-05-07 NOTE — Patient Instructions (Signed)
Medication Instructions:  Your physician recommends that you continue on your current medications as directed. Please refer to the Current Medication list given to you today.  If you need a refill on your cardiac medications before your next appointment, please call your pharmacy.   Lab work: None ordered If you have labs (blood work) drawn today and your tests are completely normal, you will receive your results only by: Marland Kitchen MyChart Message (if you have MyChart) OR . A paper copy in the mail If you have any lab test that is abnormal or we need to change your treatment, we will call you to review the results.  Testing/Procedures: Your physician has requested that you have a dobutamine stress echocardiogram for aortic stenosis at [facility]. For further information please visit [email or web address]. Please follow instruction sheet as given.  Follow-Up: . You have been referred to see Dr. Angelena Form in the TAVR clinic .    Any Other Special Instructions Will Be Listed Below (If Applicable).

## 2018-05-12 ENCOUNTER — Other Ambulatory Visit: Payer: Self-pay | Admitting: Adult Health

## 2018-05-13 ENCOUNTER — Telehealth (HOSPITAL_COMMUNITY): Payer: Self-pay | Admitting: *Deleted

## 2018-05-13 NOTE — Telephone Encounter (Signed)
Called and spoke with pt husband [name] who stated pt was out at the time, but will have pt returned CR phone call.  If pt does not follow up, will follow up with her in a week.

## 2018-05-13 NOTE — Telephone Encounter (Signed)
Pt returned call from message left.  Pt thought the call was in regards to her stress echo on tomorrow.  Explained that no we were calling in regards to cardiac rehab - education/exercise program. Pt with multiple questions in regards to what she is suppose to do leading up to the echo on tomorrow.  Advised pt that this is not an area that I am familiar with. Called heart and vascular and was advised to have pt contact Dr. Tamala Julian office for instructions. Called back and spoke to Pt to relay the telephone number - [phone].  Pt thanked me for the call back.  Will hold for now for cardiac rehab until plan for TAVR has been determined. Cherre Huger, BSN Cardiac and Training and development officer

## 2018-05-14 ENCOUNTER — Ambulatory Visit (HOSPITAL_COMMUNITY)
Admission: RE | Admit: 2018-05-14 | Discharge: 2018-05-14 | Disposition: A | Discharge status: Home / Self Care | Payer: Medicare Other | Source: Ambulatory Visit | Attending: Interventional Cardiology | Admitting: Interventional Cardiology

## 2018-05-14 DIAGNOSIS — E119 Type 2 diabetes mellitus without complications: Secondary | ICD-10-CM | POA: Diagnosis not present

## 2018-05-14 DIAGNOSIS — E785 Hyperlipidemia, unspecified: Secondary | ICD-10-CM | POA: Insufficient documentation

## 2018-05-14 DIAGNOSIS — I447 Left bundle-branch block, unspecified: Secondary | ICD-10-CM | POA: Insufficient documentation

## 2018-05-14 DIAGNOSIS — R0609 Other forms of dyspnea: Secondary | ICD-10-CM | POA: Insufficient documentation

## 2018-05-14 DIAGNOSIS — I350 Nonrheumatic aortic (valve) stenosis: Secondary | ICD-10-CM | POA: Diagnosis not present

## 2018-05-14 DIAGNOSIS — I2510 Atherosclerotic heart disease of native coronary artery without angina pectoris: Secondary | ICD-10-CM | POA: Insufficient documentation

## 2018-05-14 DIAGNOSIS — K219 Gastro-esophageal reflux disease without esophagitis: Secondary | ICD-10-CM | POA: Diagnosis not present

## 2018-05-14 NOTE — Progress Notes (Signed)
  Echocardiogram Echocardiogram Stress Test has been performed.  Darlina Sicilian M 05/14/2018, 12:21 PM

## 2018-05-15 MED FILL — Dobutamine in Dextrose 5% Inj 1 MG/ML: INTRAVENOUS | Qty: 250 | Status: AC

## 2018-05-16 ENCOUNTER — Telehealth: Payer: Self-pay | Admitting: Interventional Cardiology

## 2018-05-16 NOTE — Telephone Encounter (Signed)
Spoke with pt and reviewed echo results.  Answered all of pt's questions.  Pt appreciative for call.

## 2018-05-16 NOTE — Telephone Encounter (Signed)
New Message   Patient is returning call in reference to echocardiogram results.

## 2018-05-19 ENCOUNTER — Other Ambulatory Visit: Payer: Self-pay | Admitting: Adult Health

## 2018-05-20 NOTE — Telephone Encounter (Signed)
Sent to the pharmacy by e-scribe. 

## 2018-06-02 ENCOUNTER — Encounter: Payer: Self-pay | Admitting: Physician Assistant

## 2018-06-03 NOTE — Progress Notes (Signed)
Valve Clinic Consult Note  Chief Complaint  Patient presents with  . New Patient (Initial Visit)    Severe aortic stenosis   History of Present Illness: 73 yo female with history of CAD with prior PCI, LBBB, carotid artery disease, GERD, Grave's disease, HTN, hypothyroidism, irritable bowel syndrome, diabetes mellitus and aortic stenosis who is here today as a new consult, referred by Dr. Tamala Julian, for further discussion of her aortic stenosis and possible TAVR. She has been followed by Dr. Tamala Julian for moderate aortic stenosis. Cardiac cath 02/20/18 with severe stenosis in the mid RCA. A drug eluting stent was placed in the RCA on 04/22/18. By cath her mean gradient across the aortic valve was 18 mmHg and the peak gradient was 21 mmHg. AVA estimated at 1.21 cm2. Echo 11/1917 with LVEF=65-70%. The aortic valve leaflets are thickened and calcified with restricted mobility. Mean gradient 26 mmHg, peak gradient 48 mmHg. AVA 1.19 cm2. Dobutamine echo 05/14/18 with elevation of mean gradient to 40 mmHg with dobutamine infusion. She is felt to have true aortic stenosis but the dobutamine study did not confirm severe stenosis. She has been seen in the pulmonary office and has no findings to suggest underlying lung disease. She was seen by Dr. Tamala Julian recently following her RCA PCI and stenting and reported no improvement in her dyspnea and fatigue. She tells me today that she has dyspnea and fatigue with minimal exertion. No chest pain, dizziness, near syncope or syncope. No LE edema. She had been walking several miles per day but now cannot walk more than 1 block before stopping.   She lives with her husband in [place]. She is retired from the [employer]. She sees the dentist regularly. No active dental issues.   Primary Care Physician: [name], NP Primary Cardiologist: Daneen Schick Referring Cardiologist: Daneen Schick  Past Medical History:  Diagnosis Date  . Arthritis    "back" (04/22/2018)   . CAD (coronary artery disease)    a. 03/2018 s/p PCI/DES to the RCA (3.0x15 Onyx DES).  . Carotid artery stenosis    Mild  . Chronic lower back pain   . Colon polyps   . Diverticulitis   . Diverticulosis   . GERD (gastroesophageal reflux disease)   . Grave's disease   . History of colonic polyps 05/22/2017  . History of hiatal hernia   . Hypertension   . Hypothyroidism   . IBS (irritable bowel syndrome)   . Osteopenia   . Pneumonia    "I've had it 3 times" (04/22/2018)  . Thalassemia minor   . Type II diabetes mellitus ([place])     Past Surgical History:  Procedure Laterality Date  . [place] STUDY N/A 03/03/2018   Procedure: [place] STUDY;  Surgeon: Mauri Pole, MD;  Location: [facility];  Service: Endoscopy;  Laterality: N/A;  . COLONOSCOPY W/ BIOPSIES AND POLYPECTOMY    . CORONARY ANGIOGRAPHY Right 04/21/2018   Procedure: CORONARY ANGIOGRAPHY (CATH LAB);  Surgeon: [name], MD;  Location: [facility];  Service: Cardiovascular;  Laterality: Right;  . CORONARY STENT INTERVENTION N/A 04/22/2018   Procedure: CORONARY STENT INTERVENTION;  Surgeon: [name], MD;  Location: [facility];  Service: Cardiovascular;  Laterality: N/A;  . DILATION AND CURETTAGE OF UTERUS    . ESOPHAGEAL MANOMETRY N/A 03/03/2018   Procedure: ESOPHAGEAL MANOMETRY (EM);  Surgeon: Mauri Pole, MD;  Location: [facility];  Service: Endoscopy;  Laterality: N/A;  .  HERNIA REPAIR    . HYSTEROSCOPY     fibroids  . LAPAROSCOPIC CHOLECYSTECTOMY    . LAPAROSCOPY     fibroids  . NISSEN FUNDOPLICATION  3491P  . RIGHT/LEFT HEART CATH AND CORONARY ANGIOGRAPHY N/A 02/20/2018   Procedure: RIGHT/LEFT HEART CATH AND CORONARY ANGIOGRAPHY;  Surgeon: [name], MD;  Location: [facility];  Service: Cardiovascular;  Laterality: N/A;  . TONSILLECTOMY      Current Outpatient Medications  Medication Sig Dispense Refill  . aspirin 81 MG tablet Take 81 mg by mouth  daily.      . bisoprolol (ZEBETA) 5 MG tablet TAKE 1 TABLET BY MOUTH EVERY DAY 90 tablet 0  . chlorpheniramine (CHLOR-TRIMETON) 4 MG tablet Take 4 mg by mouth every 4 (four) hours as needed for allergies.    . cholecalciferol (VITAMIN D) 1000 units tablet Take 1,000 Units by mouth daily.    . clopidogrel (PLAVIX) 75 MG tablet Take 1 tablet (75 mg total) by mouth daily with breakfast. 30 tablet 6  . escitalopram (LEXAPRO) 20 MG tablet TAKE 1 TABLET BY MOUTH ONCE DAILY 90 tablet 0  . hydrochlorothiazide (MICROZIDE) 12.5 MG capsule Take 12.5 mg by mouth daily.    . insulin NPH Human (NOVOLIN N RELION) 100 UNIT/ML injection Inject 1.2 mLs (120 Units total) into the skin every morning. And syringes 1/day 40 mL 11  . Insulin Syringe-Needle U-100 (BD INSULIN SYRINGE U/F) 31G X 5/16" 1 ML MISC USE TO INJECT INSULIN ONCE DAILY 90 each 2  . losartan (COZAAR) 50 MG tablet Take 1 tablet (50 mg total) by mouth daily. 90 tablet 1  . Multiple Vitamin (MULITIVITAMIN WITH MINERALS) TABS Take 1 tablet by mouth daily.    . nitroGLYCERIN (NITROSTAT) 0.4 MG SL tablet Place 1 tablet (0.4 mg total) under the tongue every 5 (five) minutes as needed for chest pain. 25 tablet 3  . nortriptyline (PAMELOR) 25 MG capsule Take 1 capsule (25 mg total) by mouth at bedtime. 90 capsule 1  . ONETOUCH DELICA LANCETS 91T MISC USE TO CHECK BLOOD SUGAR TWICE DAILY 200 each 0  . ONETOUCH VERIO test strip USE TO CHECK BLOOD SUGAR TWICE DAILY 200 each 0  . pantoprazole (PROTONIX) 40 MG tablet Take 1 tablet (40 mg total) by mouth 2 (two) times daily. 60 tablet 2  . SYNTHROID 175 MCG tablet TAKE 1 TABLET BY MOUTH ONCE DAILY. 90 tablet 0   No current facility-administered medications for this visit.     Allergies  Allergen Reactions  . Statins Other (See Comments)    Muscle aches    Social History   Socioeconomic History  . Marital status: Married    Spouse name: Not on file  . Number of children: 2  . Years of education: Not  on file  . Highest education level: Not on file  Occupational History  . Occupation: Tree surgeon of the [employer]  . Financial resource strain: Not on file  . Food insecurity:    Worry: Not on file    Inability: Not on file  . Transportation needs:    Medical: Not on file    Non-medical: Not on file  Tobacco Use  . Smoking status: Never Smoker  . Smokeless tobacco: Never Used  Substance and Sexual Activity  . Alcohol use: No    Alcohol/week: 0.0 standard drinks  . Drug use: Never  . Sexual activity: Not Currently  Lifestyle  . Physical activity:  Days per week: Not on file    Minutes per session: Not on file  . Stress: Not on file  Relationships  . Social connections:    Talks on phone: Not on file    Gets together: Not on file    Attends religious service: Not on file    Active member of club or organization: Not on file    Attends meetings of clubs or organizations: Not on file    Relationship status: Not on file  . Intimate partner violence:    Fear of current or ex partner: Not on file    Emotionally abused: Not on file    Physically abused: Not on file    Forced sexual activity: Not on file  Other Topics Concern  . Not on file  Social History Narrative   Lives with husband in a one story home.     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   Education: college.    Family History  Adopted: Yes  Problem Relation Age of Onset  . Healthy Son        x 2  . Headache Other        Cluster headaches  . Heart failure Mother   . Colon cancer Neg Hx   . Pancreatic cancer Neg Hx   . Rectal cancer Neg Hx   . Stomach cancer Neg Hx     Review of Systems:  As stated in the HPI and otherwise negative.   BP 124/66   Pulse 72   Ht 5' 5"  (1.651 m)   Wt 210 lb (95.3 kg)   BMI 34.95 kg/m   Physical Examination: General: Well developed, well nourished, NAD  HEENT: OP  clear, mucus membranes moist  SKIN: warm, dry. No rashes. Neuro: No focal deficits  Musculoskeletal: Muscle strength 5/5 all ext  Psychiatric: Mood and affect normal  Neck: No JVD, no carotid bruits, no thyromegaly, no lymphadenopathy.  Lungs:Clear bilaterally, no wheezes, rhonci, crackles Cardiovascular: Regular rate and rhythm. Loud, harsh, late peaking systolic murmur.  Abdomen:Soft. Bowel sounds present. Non-tender.  Extremities: No lower extremity edema. Pulses are 2 + in the bilateral DP/PT.  Dobutamine stress echo 05/14/18: Study Conclusions  - Aortic valve: Valve area (VTI): 1.03 cm^2. - Stress ECG conclusions: There were no stress arrhythmias or   conduction abnormalities. The stress ECG was non-diagnostic. due   to LBBB.  Impressions:  - There is moderate aortic stenosis at baseline.   There is a positive inotrope response to dobutamine marked by   increased aortic stroke volume, consistent with significant   myocardial reserve.   The increase in gradients (without a relative change in estimated   aortic valve area) is consistent with true aortic stenosis.   However, the study did not meet criteria for severe aortic   stenosis.     2D images acquired during dobutamine stress are limited and are   of poor technical quality. No assessment of LV function, ischemia   or regional wall motion can be made on this study. Baseline   mitral valve gradients were not acquired. At peak dobutamine   dose/peak cardiac output, the mean transmitral gradient is 7 mm   Hg (heart rate 85 bpm). There is probably at most mild mitral   stenosis at baseline.  ------------------------------------------------------------------- Study data:   Study status:  Routine.  Consent:  The risks, benefits, and alternatives to the procedure were explained  to the patient and informed consent was obtained.  Procedure:  The patient reported no pain pre or post test. Initial setup. The patient  was brought to the laboratory. A baseline ECG was recorded. Surface ECG leads and automatic cuff blood pressure measurements were monitored.  Dobutamine stress test. Stress testing was performed, with dobutamine infusion to a maximum of 20 mcg/kg/min. Transthoracic stress echocardiography for assessment of valvular function. Image quality was poor. The study was technically limited due to poor acoustic window availability. Images were captured at baseline, low dose, peak dose, and recovery.  Study completion: The patient tolerated the procedure well. There were no complications.          Dobutamine. Stress echocardiography.  2D. Birthdate:  Patient birthdate: [birth year].  Age:  Patient is 73 yr old.  Sex:  Gender: female.    BMI: 34.3 kg/m^2.  Blood pressure:   135/82  Patient status:  Outpatient.  Study date:  Study date: 05/14/2018. Study time: 10:10 AM.  -------------------------------------------------------------------  ------------------------------------------------------------------- Aortic valve:   Doppler:     VTI ratio of LVOT to aortic valve: 0.45. Valve area (VTI): 1.03 cm^2. Indexed valve area (VTI): 0.49 cm^2/m^2. Peak velocity ratio of LVOT to aortic valve: 0.47. Valve area (Vmax): 1.06 cm^2. Indexed valve area (Vmax): 0.5 cm^2/m^2. Mean velocity ratio of LVOT to aortic valve: 0.44. Valve area (Vmean): 1 cm^2. Indexed valve area (Vmean): 0.47 cm^2/m^2.    Mean gradient (S): 34 mm Hg. Peak gradient (S): 54 mm Hg.  ------------------------------------------------------------------- Mitral valve:   Doppler:     Valve area by pressure half-time: 2.53 cm^2. Indexed valve area by pressure half-time: 1.2 cm^2/m^2. Valve area by continuity equation (using LVOT flow): 1.61 cm^2. Indexed valve area by continuity equation (using LVOT flow): 0.76 cm^2/m^2.    Mean gradient (D): 7 mm Hg.   ------------------------------------------------------------------- Baseline ECG:    Normal sinus rhythm with left bundle branch block.   ------------------------------------------------------------------- Stress protocol:  +--------+--------+ !Stage   !Symptoms! +--------+--------+ !Baseline!None    ! +--------+--------+  ------------------------------------------------------------------- Stress ECG:  There were no stress arrhythmias or conduction abnormalities.  The stress ECG was non-diagnostic.   due to LBBB.   ------------------------------------------------------------------- Baseline: Peak aortic gradient 28 mm Hg, mean aortic gradient 17 mm Hg, dimensionless index 0.44 .  Dobutamine 5 ug/kg/min: Peak aortic gradient 45 mm Hg, mean aortic gradient 28 mm Hg, dimensionless index 0.40. Dobutamine 20 ug/kg/min: Peak aortic gradient 61 mm Hg, mean aortic gradient 40 mm Hg, dimensionless index 0.44. Recovery:  ------------------------------------------------------------------- Measurements   Left ventricle                           Value           Reference  Stroke volume, 2D                        61     ml       ---------  Stroke volume/bsa, 2D                    29     ml/m^2   ---------  LV filling time, D, DP                   310    ms       ---------  LV e&', lateral  4.79   cm/s     ---------  LV E/e&', lateral                         14.41           ---------  LV e&', medial                            3.81   cm/s     ---------  LV E/e&', medial                          18.11           ---------  LV e&', average                           4.3    cm/s     ---------  LV E/e&', average                         16.05           ---------    LVOT                                     Value           Reference  LVOT ID, S                               17     mm       ---------  LVOT area                                2.27   cm^2     ---------  LVOT peak velocity, S                    171    cm/s     ---------  LVOT mean  velocity, S                    123    cm/s     ---------  LVOT VTI, S                              26.8   cm       ---------  LVOT peak gradient, S                    12     mm Hg    ---------    Aortic valve                             Value           Reference  Aortic valve peak velocity, S            366    cm/s     ---------  Aortic valve mean velocity, S            280    cm/s     ---------  Aortic valve VTI, S                      59.3   cm       ---------  Aortic mean gradient, S                  34     mm Hg    ---------  Aortic peak gradient, S                  54     mm Hg    ---------  VTI ratio, LVOT/AV                       0.45            ---------  Aortic valve area, VTI                   1.03   cm^2     ---------  Aortic valve area/bsa, VTI               0.49   cm^2/m^2 ---------  Velocity ratio, peak, LVOT/AV            0.47            ---------  Aortic valve area, peak velocity         1.06   cm^2     ---------  Aortic valve area/bsa, peak velocity     0.5    cm^2/m^2 ---------  Velocity ratio, mean, LVOT/AV            0.44            ---------  Aortic valve area, mean velocity         1      cm^2     ---------  Aortic valve area/bsa, mean velocity     0.47   cm^2/m^2 ---------    Mitral valve                             Value           Reference  Mitral E-wave peak velocity              69     cm/s     ---------  Mitral A-wave peak velocity              185    cm/s     ---------  Mitral mean velocity, D                  122.37 cm/s     ---------  Mitral deceleration time             (H) 310    ms       150 - 230  Mitral pressure half-time                87     ms       ---------  Mitral mean gradient, D                  7      mm Hg    ---------  Mitral E/A ratio, peak                   0.4             ---------  Mitral valve area, PHT, DP               2.53   cm^2     ---------  Mitral valve area/bsa, PHT, DP           1.2    cm^2/m^2 ---------  Mitral valve area,  LVOT continuity       1.61   cm^2     ---------  Mitral valve area/bsa, LVOT              0.76   cm^2/m^2 ---------  continuity  Mitral annulus VTI, D                    38.8   cm       ---------   Echo 12/09/17: Procedure narrative: Transthoracic echocardiography. Image   quality was suboptimal. The study was technically difficult, as a   result of poor acoustic windows, poor sound wave transmission,   and body habitus. Intravenous contrast (Definity) was   administered. - Left ventricle: The cavity size was normal. Systolic function was   vigorous. The estimated ejection fraction was in the range of 65%   to 70%. Wall motion was normal; there were no regional wall   motion abnormalities. Doppler parameters are consistent with   abnormal left ventricular relaxation (grade 1 diastolic   dysfunction). - Aortic valve: Trileaflet; moderately thickened, moderately   calcified leaflets. Valve mobility was restricted. There was   moderate stenosis. Peak velocity (S): 346 cm/s. Mean gradient   (S): 26 mm Hg. Peak gradient (S): 48 mm Hg. Valve area (VTI):   1.19 cm^2. - Mitral valve: Severely calcified annulus. Valve area by pressure   half-time: 2.44 cm^2. - Left atrium: The atrium was mildly dilated. Volume/bsa, ES,   (1-plane Simpson&'s, A2C): 41.3 ml/m^2.  Impressions:  - Aortic stenosis has advanced when compared to prior.  ------------------------------------------------------------------- Labs, prior tests, procedures, and surgery: Transthoracic echocardiography (12/14/2015).    The aortic valve showed mild stenosis.  EF was 60%. Aortic valve: peak gradient of 29 mm Hg and mean gradient of 15 mm Hg.  ------------------------------------------------------------------- Study data:  Comparison was made to the study of 12/14/2015.  Study status:  Routine.  Procedure:  The patient reported no pain pre or post test. Transthoracic echocardiography. Image quality was suboptimal.  The study was technically difficult, as a result of poor acoustic windows, poor sound wave transmission, and body habitus. Intravenous contrast (Definity) was administered.  Study completion:  There were no complications.          Transthoracic echocardiography.  M-mode, complete 2D, spectral Doppler, and color Doppler.  Birthdate:  Patient birthdate: [birth year].  Age:  Patient is 73 yr old.  Sex:  Gender: female.    BMI: 33.5 kg/m^2.  Blood pressure:     116/76  Patient status:  Outpatient.  Study date: Study date: 12/09/2017. Study time: 11:29 AM.  Location:  [facility]  -------------------------------------------------------------------  ------------------------------------------------------------------- Left ventricle:  The cavity size was normal. Systolic function was vigorous. The estimated ejection fraction was in the range of 65% to 70%. Wall motion was normal; there were no regional wall motion abnormalities. Doppler parameters are consistent with abnormal left ventricular relaxation (grade 1 diastolic dysfunction).  ------------------------------------------------------------------- Aortic valve:   Trileaflet; moderately thickened, moderately calcified leaflets. Valve mobility was restricted.  Doppler: There was moderate stenosis.   There was no regurgitation.    VTI ratio of LVOT to aortic valve: 0.38. Valve  area (VTI): 1.19 cm^2. Indexed valve area (VTI): 0.57 cm^2/m^2. Peak velocity ratio of LVOT to aortic valve: 0.34. Valve area (Vmax): 1.05 cm^2. Indexed valve area (Vmax): 0.5 cm^2/m^2. Mean velocity ratio of LVOT to aortic valve: 0.35. Valve area (Vmean): 1.09 cm^2. Indexed valve area (Vmean): 0.52 cm^2/m^2.    Mean gradient (S): 26 mm Hg. Peak gradient (S): 48 mm Hg.  ------------------------------------------------------------------- Aorta:  Aortic root: The aortic root was normal in  size.  ------------------------------------------------------------------- Mitral valve:   Severely calcified annulus. Mobility was not restricted.  Doppler:  Transvalvular velocity was within the normal range. There was no evidence for stenosis. There was no regurgitation.    Valve area by pressure half-time: 2.44 cm^2. Indexed valve area by pressure half-time: 1.17 cm^2/m^2.    Peak gradient (D): 6 mm Hg.  ------------------------------------------------------------------- Left atrium:  The atrium was mildly dilated.  ------------------------------------------------------------------- Right ventricle:  The cavity size was normal. Wall thickness was normal. Systolic function was normal.  ------------------------------------------------------------------- Pulmonic valve:   Poorly visualized.  Structurally normal valve. Cusp separation was normal.  Doppler:  Transvalvular velocity was within the normal range. There was no evidence for stenosis. There was no regurgitation.  ------------------------------------------------------------------- Tricuspid valve:   Structurally normal valve.    Doppler: Transvalvular velocity was within the normal range. There was no regurgitation.  ------------------------------------------------------------------- Pulmonary artery:   The main pulmonary artery was normal-sized. Systolic pressure was within the normal range.  ------------------------------------------------------------------- Right atrium:  The atrium was normal in size.  ------------------------------------------------------------------- Pericardium:  There was no pericardial effusion.  ------------------------------------------------------------------- Systemic veins: Inferior vena cava: The vessel was normal in size.  ------------------------------------------------------------------- Measurements   Left ventricle                            Value           Reference  LV ID, ED, PLAX chordal                   52    mm       43 - 52  LV ID, ES, PLAX chordal                   34    mm       23 - 38  LV fx shortening, PLAX chordal            35    %        >=29  LV PW thickness, ED                       10    mm       ---------  IVS/LV PW ratio, ED                       1.1            <=1.3  Stroke volume, 2D                         98    ml       ---------  Stroke volume/bsa, 2D                     47    ml/m^2   ---------  LV e&', lateral  5.87  cm/s     ---------  LV E/e&', lateral                          20.44          ---------  LV e&', medial                             5     cm/s     ---------  LV E/e&', medial                           24             ---------  LV e&', average                            5.44  cm/s     ---------  LV E/e&', average                          22.08          ---------    Ventricular septum                        Value          Reference  IVS thickness, ED                         11    mm       ---------    LVOT                                      Value          Reference  LVOT ID, S                                20    mm       ---------  LVOT area                                 3.14  cm^2     ---------  LVOT peak velocity, S                     116   cm/s     ---------  LVOT mean velocity, S                     83    cm/s     ---------  LVOT VTI, S                               31.2  cm       ---------  LVOT peak gradient, S                     5     mm Hg    ---------    Aortic valve  Value          Reference  Aortic valve peak velocity, S             346   cm/s     ---------  Aortic valve mean velocity, S             239   cm/s     ---------  Aortic valve VTI, S                       82.6  cm       ---------  Aortic mean gradient, S                   26    mm Hg    ---------  Aortic peak gradient, S                   48    mm Hg    ---------  VTI  ratio, LVOT/AV                        0.38           ---------  Aortic valve area, VTI                    1.19  cm^2     ---------  Aortic valve area/bsa, VTI                0.57  cm^2/m^2 ---------  Velocity ratio, peak, LVOT/AV             0.34           ---------  Aortic valve area, peak velocity          1.05  cm^2     ---------  Aortic valve area/bsa, peak               0.5   cm^2/m^2 ---------  velocity  Velocity ratio, mean, LVOT/AV             0.35           ---------  Aortic valve area, mean velocity          1.09  cm^2     ---------  Aortic valve area/bsa, mean               0.52  cm^2/m^2 ---------  velocity    Aorta                                     Value          Reference  Aortic root ID, ED                        34    mm       ---------    Left atrium                               Value          Reference  LA ID, A-P, ES                            55    mm       ---------  LA ID/bsa, A-P                    (H)     2.64  cm/m^2   <=2.2  LA volume, S                              72.8  ml       ---------  LA volume/bsa, S                          34.9  ml/m^2   ---------  LA volume, ES, 1-p A4C                    58    ml       ---------  LA volume/bsa, ES, 1-p A4C                27.8  ml/m^2   ---------  LA volume, ES, 1-p A2C                    86.1  ml       ---------  LA volume/bsa, ES, 1-p A2C                41.3  ml/m^2   ---------    Mitral valve                              Value          Reference  Mitral E-wave peak velocity               120   cm/s     ---------  Mitral A-wave peak velocity               168   cm/s     ---------  Mitral deceleration time          (H)     306   ms       150 - 230  Mitral pressure half-time                 90    ms       ---------  Mitral peak gradient, D                   6     mm Hg    ---------  Mitral E/A ratio, peak                    0.7            ---------  Mitral valve area, PHT, DP                2.44  cm^2      ---------  Mitral valve area/bsa, PHT, DP            1.17  cm^2/m^2 ---------    Systemic veins                            Value          Reference  Estimated CVP                             3  mm Hg    ---------    Right ventricle                           Value          Reference  TAPSE                                     18.5  mm       ---------  RV s&', lateral, S                         9.03  cm/s     ---------  Cardiac cath 02/20/18:  70 to 80% mid RCA  Widely patent LAD, left main, and circumflex.  Normal LV systolic function.  LVEDP normal at 15 mmHg.  Estimated EF 55%.  Moderately severe pulmonary hypertension with PA systolic pressure 66 mmHg and PA mean pressure 41 mmHg.  Pulmonary capillary wedge mean pressure 18 mmHg  Right atrial mean pressure 11 mmHg  Mild to moderate aortic stenosis with calculated aortic valve area 1.21 cm based upon a peak to peak gradient of 21 mmHg and mean gradient of 18 mmHg.  This would suggest that the patient's aortic valve is probably not playing a major role in production of pulmonary hypertension or dyspnea.  RECOMMENDATIONS:   Exclude recurrent pulmonary emboli.  Exclude other causes of pulmonary hypertension such as connective tissue disease.  Consider obstructive sleep apnea.  Eventual PCI with stenting of the right coronary.  This should probably be done after the patient has had what ever pulmonary and GI procedures.  Stenting would prevent discontinuation of dual antiplatelet therapy for at least 6 months and possibly a year.  Plan to speak with providers involved and make a decision about when to proceed with PCI.  Aortic valve is not severely involved enough to recommend therapy at this time.      Surgeon Notes     04/21/2018 8:38 AM CV Procedure signed by [name], MD    02/20/2018 12:27 PM CV Procedure signed by [name], MD  Indications   Aortic stenosis, moderate [I35.0 (ICD-10-CM)]   Coronary artery disease involving native coronary artery of native heart without angina pectoris [I25.10 (ICD-10-CM)]  Procedural Details   Technical Details The right radial area was sterilely prepped and draped. Intravenous sedation with Versed and fentanyl was administered. 1% Xylocaine was infiltrated to achieve local analgesia. Using real-time vascular ultrasound, a double wall stick with an angiocath was utilized to obtain intra-arterial access. A VUS image was saved for the permanent record.The modified Seldinger technique was used to place a 71F " Slender" sheath in the right radial artery. Weight based heparin was administered. Coronary angiography was done using 5 F catheters. Right coronary angiography was performed with a JR4. Left ventricular hemodymic recordings and angiography was done using the JR 4 catheter and hand injection. Left coronary angiography was performed with a JL 3.5 cm.  Right heart catheterization was performed by exchanging a previously placed antecubital IV angio-cath for a 5 French Slender sheath. 1% Xylocaine was used to locally nesthetize the area around the IV site. The IV catheter was wired using an .018 guidewire. The modified Seldinger technique was used to place the 5 [place] sheath. Double glove technique was used to enhance sterility. After sheath insertion, right  heart cath was performed using a 5 French balloon tipped catheter and fluoroscopic guidance. Pressures were recorded in each chamber and in the pulmonary capillary wedge position.. The main pulmonary artery O2 saturation was sampled.   Hemostasis was achieved using a pneumatic band.  During this procedure the patient is administered a total of Versed 2 mg and Fentanyl 125 mg to achieve and maintain moderate conscious sedation. The patient's heart rate, blood pressure, and oxygen saturation are monitored continuously during the procedure. The period of conscious sedation is 69 minutes, of which I was  present face-to-face 100% of this time.   Estimated blood loss <50 mL.  During this procedure the patient was administered the following to achieve and maintain moderate conscious sedation: Versed 2 mg, Fentanyl 125 mcg, while the patient's heart rate, blood pressure, and oxygen saturation were continuously monitored. The period of conscious sedation was 69 minutes, of which I was present face-to-face 100% of this time.  Medications  (Filter: Administrations occurring from 02/20/18 1118 to 02/20/18 1232)  Medication Rate/Dose/Volume Action  Date Time   Heparin (Porcine) in NaCl 1000-0.9 UT/500ML-% SOLN (mL) 500 mL Given 02/20/18 1128   Total dose as of 06/03/18 1636        500 mL        Heparin (Porcine) in NaCl 1000-0.9 UT/500ML-% SOLN (mL) 500 mL Given 02/20/18 1128   Total dose as of 06/03/18 1636        500 mL        fentaNYL (SUBLIMAZE) injection (mcg) 25 mcg Given 02/20/18 1136   Total dose as of 06/03/18 1636        25 mcg        midazolam (VERSED) injection (mg) 1 mg Given 02/20/18 1136   Total dose as of 06/03/18 1636        1 mg        lidocaine (PF) (XYLOCAINE) 1 % injection (mL) 2 mL Given 02/20/18 1143   Total dose as of 06/03/18 1636 2 mL Given 1144   4 mL        Radial Cocktail/Verapamil only (mL) 10 mL Given 02/20/18 1151   Total dose as of 06/03/18 1636        10 mL        heparin injection (Units) 4,500 Units Given 02/20/18 1203   Total dose as of 06/03/18 1636        4,500 Units        iohexol (OMNIPAQUE) 350 MG/ML injection (mL) 80 mL Given 02/20/18 1220   Total dose as of 06/03/18 1636        80 mL        Sedation Time   Sedation Time Physician-1: 42 minutes 17 seconds  Coronary Findings   Diagnostic  Dominance: Right  Right Coronary Artery  Prox RCA to Mid RCA lesion 75% stenosed  Prox RCA to Mid RCA lesion is 75% stenosed.  Intervention   No interventions have been documented.  Right Heart   Right Heart Pressures Hemodynamic findings consistent  with moderate pulmonary hypertension.  Wall Motion   Resting               Left Heart   Left Ventricle The left ventricular size is normal. The left ventricular systolic function is normal. LV end diastolic pressure is normal. The left ventricular ejection fraction is 55-65% by visual estimate.  Aortic Valve There is moderate aortic valve stenosis. There is no aortic valve regurgitation. The aortic  valve is calcified. There is restricted aortic valve motion.  Coronary Diagrams   Diagnostic  Dominance: Right      EKG:  EKG is not ordered today. The ekg ordered today demonstrates   Recent Labs: 06/14/2017: ALT 32 08/05/2017: TSH 0.79 02/19/2018: NT-Pro BNP 270 04/23/2018: BUN 10; Creatinine, Ser 0.76; Hemoglobin 10.4; Platelets 184; Potassium 3.9; Sodium 139   Lipid Panel    Component Value Date/Time   CHOL 159 06/14/2017 0826   CHOL 156 11/21/2016 1113   TRIG 123.0 06/14/2017 0826   HDL 41.20 06/14/2017 0826   HDL 44 11/21/2016 1113   CHOLHDL 4 06/14/2017 0826   VLDL 24.6 06/14/2017 0826   LDLCALC 93 06/14/2017 0826   LDLCALC 91 11/21/2016 1113   LDLDIRECT 98.0 05/24/2014 1343     Wt Readings from Last 3 Encounters:  06/04/18 210 lb (95.3 kg)  05/07/18 205 lb 12.8 oz (93.4 kg)  04/23/18 206 lb 8 oz (93.7 kg)     Other studies Reviewed: Additional studies/ records that were reviewed today include: .echo images, EKG, office notes, cath images Review of the above records demonstrates: aortic stenosis   Assessment and Plan:   1. Moderate to severe aortic stenosis: She has severe, stage D aortic valve stenosis. I have personally reviewed the echo images. The aortic valve is thickened, calcified with limited leaflet mobility. I think she would benefit from AVR. While she would likely do well with either surgical AVR or TAVR, given advanced age, we will consider TAVR. I think she is a good candidate for TAVR.   STS Risk Score:  Risk of Mortality: 1.724%  Renal  Failure: 3.161%  Permanent Stroke: 1.766%  Prolonged Ventilation: 8.712%  DSW Infection: 0.093%  Reoperation: 3.758%  Morbidity or Mortality: 13.828%  Short Length of Stay: 25.853% Long Length of Stay: 6.134%   I have reviewed the natural history of aortic stenosis with the patient and their family members  who are present today. We have discussed the limitations of medical therapy and the poor prognosis associated with symptomatic aortic stenosis. We have reviewed potential treatment options, including palliative medical therapy, conventional surgical aortic valve replacement, and transcatheter aortic valve replacement. We discussed treatment options in the context of the patient's specific comorbid medical conditions.   She would like to proceed with planning for TAVR. Risks and benefits of the TAVR procedure reviewed with the patient. We will arrange a gated cardiac CTA, CTA of the chest/abdomen and pelvis, carotid dopplers, PT assesement and she will then be referred to see one of the CT surgeons on our TAVR team.   She has already had a cardiac cath and recent PFTs.   Current medicines are reviewed at length with the patient today.  The patient does not have concerns regarding medicines.  The following changes have been made:  no change  Labs/ tests ordered today include:   Orders Placed This Encounter  Procedures  . Basic Metabolic Panel (BMET)     Disposition:   FU with the TAVR team    Signed, Lauree Chandler, MD 06/04/2018 10:44 AM    [address], [place], [place]  [address] Phone: [phone]; Fax: [phone]

## 2018-06-04 ENCOUNTER — Encounter: Payer: Self-pay | Admitting: Cardiovascular Disease

## 2018-06-04 ENCOUNTER — Ambulatory Visit (INDEPENDENT_AMBULATORY_CARE_PROVIDER_SITE_OTHER): Payer: Medicare Other | Admitting: Cardiovascular Disease

## 2018-06-04 VITALS — BP 124/66 | HR 72 | Ht 65.0 in | Wt 210.0 lb

## 2018-06-04 DIAGNOSIS — I350 Nonrheumatic aortic (valve) stenosis: Secondary | ICD-10-CM

## 2018-06-04 DIAGNOSIS — I25118 Atherosclerotic heart disease of native coronary artery with other forms of angina pectoris: Secondary | ICD-10-CM

## 2018-06-04 LAB — BASIC METABOLIC PANEL
BUN/Creatinine Ratio: 19 (ref 12–28)
BUN: 15 mg/dL (ref 8–27)
CO2: 23 mmol/L (ref 20–29)
Calcium: 10.3 mg/dL (ref 8.7–10.3)
Chloride: 99 mmol/L (ref 96–106)
Creatinine, Ser: 0.8 mg/dL (ref 0.57–1.00)
GFR calc Af Amer: 84 mL/min/{1.73_m2} (ref >59)
GFR calc non Af Amer: 73 mL/min/{1.73_m2} (ref >59)
Glucose: 251 mg/dL — ABNORMAL HIGH (ref 65–99)
Potassium: 4.7 mmol/L (ref 3.5–5.2)
Sodium: 136 mmol/L (ref 134–144)

## 2018-06-04 NOTE — Patient Instructions (Signed)
Medication Instructions:  Your physician recommends that you continue on your current medications as directed. Please refer to the Current Medication list given to you today.  If you need a refill on your cardiac medications before your next appointment, please call your pharmacy.   Lab work: Lab work to be done today--BMP If you have labs (blood work) drawn today and your tests are completely normal, you will receive your results only by: Marland Kitchen MyChart Message (if you have MyChart) OR . A paper copy in the mail If you have any lab test that is abnormal or we need to change your treatment, we will call you to review the results.  Testing/Procedures: Theodosia Quay, RN will contact you to schedule testing  \

## 2018-06-05 ENCOUNTER — Telehealth (HOSPITAL_COMMUNITY): Payer: Self-pay | Admitting: *Deleted

## 2018-06-05 ENCOUNTER — Other Ambulatory Visit: Payer: Self-pay

## 2018-06-05 DIAGNOSIS — R0600 Dyspnea, unspecified: Secondary | ICD-10-CM

## 2018-06-05 DIAGNOSIS — I350 Nonrheumatic aortic (valve) stenosis: Secondary | ICD-10-CM

## 2018-06-05 DIAGNOSIS — R0609 Other forms of dyspnea: Secondary | ICD-10-CM

## 2018-06-05 DIAGNOSIS — I359 Nonrheumatic aortic valve disorder, unspecified: Secondary | ICD-10-CM

## 2018-06-05 NOTE — Telephone Encounter (Signed)
Pt seen as new consult for appropriateness for TAVR.  Pt seen by Dr. Angelena Form who feels she would benefit.  Additional preop testing scheduled.  Will close this referral in anticipation of new referral once TAVR has been completed and pt is appropriate for group exercise. Called and spoke to pt who verbalized understanding and is in agreement of this. Cherre Huger, BSN Cardiac and Training and development officer

## 2018-06-17 ENCOUNTER — Ambulatory Visit (HOSPITAL_COMMUNITY)
Admission: RE | Admit: 2018-06-17 | Discharge: 2018-06-17 | Disposition: A | Discharge status: Home / Self Care | Payer: Medicare Other | Source: Ambulatory Visit | Attending: Cardiovascular Disease | Admitting: Cardiovascular Disease

## 2018-06-17 ENCOUNTER — Encounter (HOSPITAL_COMMUNITY): Payer: Self-pay

## 2018-06-17 ENCOUNTER — Ambulatory Visit (HOSPITAL_BASED_OUTPATIENT_CLINIC_OR_DEPARTMENT_OTHER)
Admission: RE | Admit: 2018-06-17 | Discharge: 2018-06-17 | Disposition: A | Discharge status: Home / Self Care | Payer: Medicare Other | Source: Ambulatory Visit | Attending: Cardiovascular Disease | Admitting: Cardiovascular Disease

## 2018-06-17 DIAGNOSIS — R0609 Other forms of dyspnea: Secondary | ICD-10-CM

## 2018-06-17 DIAGNOSIS — R0600 Dyspnea, unspecified: Secondary | ICD-10-CM

## 2018-06-17 DIAGNOSIS — I359 Nonrheumatic aortic valve disorder, unspecified: Secondary | ICD-10-CM | POA: Diagnosis not present

## 2018-06-17 DIAGNOSIS — I350 Nonrheumatic aortic (valve) stenosis: Secondary | ICD-10-CM

## 2018-06-17 DIAGNOSIS — R918 Other nonspecific abnormal finding of lung field: Secondary | ICD-10-CM | POA: Diagnosis not present

## 2018-06-17 DIAGNOSIS — K5730 Diverticulosis of large intestine without perforation or abscess without bleeding: Secondary | ICD-10-CM | POA: Diagnosis not present

## 2018-06-17 MED ORDER — IOPAMIDOL (ISOVUE-370) INJECTION 76%
125.0000 mL | Freq: Once | INTRAVENOUS | Status: AC | PRN
  Administered 2018-06-17: 125 mL via INTRAVENOUS

## 2018-06-17 NOTE — Discharge Instructions (Signed)
What You Need to Know About IV Contrast Material IV contrast material is most often a fluid that is used with some imaging tests. Contrast material is injected into your body through a vein to help your health care providers see your organs and tissues more clearly. It may be used with:  X-ray.  MRI.  CT.  Ultrasound. Contrast material is used when your health care providers need a detailed look at organs, tissues, or blood vessels that may not show up with the standard test. IV contrast may be used for imaging tests that examine:  Muscles, skin, and fat.  Breasts.  Brain.  Digestive tract.  Heart.  Liver.  Lungs and many other internal organs. What are the risks of using IV contrast material? The risks of using IV contrast material include:  Headache.  Itching, skin rash, and hives.  Allergic reactions.  Nausea and vomiting.  Wheezing or difficulty breathing.  Abnormal heart rate.  Blood pressure changes.  Throat swelling.  Kidney damage. These complications are more likely to occur in people who:  Have kidney failure.  Have liver problems.  Have certain heart problems, including: ? Heart failure. ? Heart attack. ? Heart infection. ? Heart valve problems.  Abuse alcohol.  Have allergies or asthma.  Are dehydrated.  Have sickle cell anemia or similar problems.  Have had trouble with IV contrast material in the past.  Take certain medicines, such as: ? Metformin. ? NSAIDs. ? Beta blockers. ? Interleukin-2. How do I prepare for my test with IV contrast material?  Follow instructions from your health care provider about eating or drinking restrictions.  Ask your health care provider about changing or stopping your regular medicines. This is especially important if you are taking diabetes medicines or blood thinners.  Tell your health care provider about: ? Any previous illnesses, surgeries, or pre-existing medical conditions. ? Whether you  are pregnant or may be pregnant. ? Whether you are breastfeeding. Most contrast agents are safe for use in breastfeeding women.  You may have a physical exam to determine any potential risks.  Ask if you will be given a medicine (sedative) to help you relax during the procedure. If so, plan to have someone take you home after test. What happens during the test with IV contrast material?   You may be given a sedative to help you relax.  A needle will be inserted into one of your veins to administer the IV contrast material.  You may feel warmth or flushing as the material enters your bloodstream.  You may have a metallic taste in your mouth for a few minutes.  The needle may cause some discomfort and bruising.  After the contrast material is in your body, the imaging test will be done. The procedure may vary among health care providers and hospitals. What happens after the test with IV contrast material?  You may be asked to drink water or other fluids to wash (flush) the contrast material out of your body.  Drink enough fluid to keep your urine pale yellow.  Do not drive for 24 hours if you received a sedative.  It is your responsibility to get your test results. Ask your health care provider or the department performing the test when your results will be ready. Contact a health care provider if:  You have redness, swelling, or pain near your IV site. Get help right away if:  You have an abnormal heart rhythm.  You have trouble breathing.  You have: ?  Chest pain. ? Pain in your back, neck, arm, jaw, or stomach. ? Nausea or sweating. ? Hives or a rash.  You start shaking and cannot stop. These symptoms may represent a serious problem that is an emergency. Do not wait to see if the symptoms will go away. Get medical help right away. Call your local emergency services (911 in the [place] Do not drive yourself to the hospital. Summary  IV contrast may be used for imaging  tests to help your health care providers see your organs and tissues more clearly.  Tell your health care provider if you are pregnant or may be pregnant.  During the procedure, you may feel warmth or flushing as the material enters your bloodstream.  After the procedure, drink enough fluid to keep your urine pale yellow. This information is not intended to replace advice given to you by your health care provider. Make sure you discuss any questions you have with your health care provider. Document Released: 03/28/2009 Document Revised: 12/02/2017 Document Reviewed: 12/15/2014 Elsevier Interactive Patient Education  2019 [employer].

## 2018-06-22 HISTORY — PX: AORTIC VALVE REPLACEMENT: SHX41

## 2018-07-02 ENCOUNTER — Other Ambulatory Visit: Payer: Self-pay | Admitting: Endocrinology

## 2018-07-02 ENCOUNTER — Institutional Professional Consult (permissible substitution) (INDEPENDENT_AMBULATORY_CARE_PROVIDER_SITE_OTHER): Payer: Medicare Other | Admitting: Surgery

## 2018-07-02 ENCOUNTER — Encounter: Payer: Self-pay | Admitting: Surgery

## 2018-07-02 ENCOUNTER — Other Ambulatory Visit: Payer: Self-pay | Admitting: Adult Health

## 2018-07-02 ENCOUNTER — Other Ambulatory Visit: Payer: Self-pay

## 2018-07-02 ENCOUNTER — Encounter: Payer: Self-pay | Admitting: Physical Therapy

## 2018-07-02 ENCOUNTER — Ambulatory Visit: Payer: Medicare Other | Attending: Cardiovascular Disease | Admitting: Physical Therapy

## 2018-07-02 VITALS — BP 111/61 | HR 72 | Resp 16 | Ht 65.0 in | Wt 205.0 lb

## 2018-07-02 DIAGNOSIS — I350 Nonrheumatic aortic (valve) stenosis: Secondary | ICD-10-CM

## 2018-07-02 DIAGNOSIS — R262 Difficulty in walking, not elsewhere classified: Secondary | ICD-10-CM | POA: Insufficient documentation

## 2018-07-02 DIAGNOSIS — I25118 Atherosclerotic heart disease of native coronary artery with other forms of angina pectoris: Secondary | ICD-10-CM | POA: Diagnosis not present

## 2018-07-02 NOTE — Therapy (Signed)
[place], [place], [address] Phone: [phone]   Fax:  [phone]  Physical Therapy Evaluation/Pre TAVR  Patient Details  Name: Breanna Webster MRN: [id number] Date of Birth: Oct 11, 1945 Referring Provider (PT): Burnell Blanks, MD   Encounter Date: 07/02/2018  PT End of Session - 07/02/18 1109    Visit Number  1    PT Start Time  1020    PT Stop Time  1109    PT Time Calculation (min)  49 min    Activity Tolerance  Patient tolerated treatment well;Patient limited by fatigue;Treatment limited secondary to medical complications (Comment)    Behavior During Therapy  [facility] for tasks assessed/performed       Past Medical History:  Diagnosis Date  . Arthritis    "back" (04/22/2018)  . CAD (coronary artery disease)    a. 03/2018 s/p PCI/DES to the RCA (3.0x15 Onyx DES).  . Carotid artery stenosis    Mild  . Chronic lower back pain   . Colon polyps   . Diverticulitis   . Diverticulosis   . GERD (gastroesophageal reflux disease)   . Grave's disease   . History of colonic polyps 05/22/2017  . History of hiatal hernia   . Hypertension   . Hypothyroidism   . IBS (irritable bowel syndrome)   . Osteopenia   . Pneumonia    "I've had it 3 times" (04/22/2018)  . Thalassemia minor   . Type II diabetes mellitus ([place])     Past Surgical History:  Procedure Laterality Date  . [place] STUDY N/A 03/03/2018   Procedure: [place] STUDY;  Surgeon: Mauri Pole, MD;  Location: [facility];  Service: Endoscopy;  Laterality: N/A;  . COLONOSCOPY W/ BIOPSIES AND POLYPECTOMY    . CORONARY ANGIOGRAPHY Right 04/21/2018   Procedure: CORONARY ANGIOGRAPHY (CATH LAB);  Surgeon: [name], MD;  Location: [place] CV LAB;  Service: Cardiovascular;  Laterality: Right;  . CORONARY STENT INTERVENTION N/A 04/22/2018   Procedure: CORONARY STENT INTERVENTION;  Surgeon: [name], MD;  Location: [place] CV LAB;  Service: Cardiovascular;  Laterality: N/A;  . DILATION AND CURETTAGE OF UTERUS    . ESOPHAGEAL MANOMETRY N/A 03/03/2018   Procedure: ESOPHAGEAL MANOMETRY (EM);  Surgeon: Mauri Pole, MD;  Location: [facility];  Service: Endoscopy;  Laterality: N/A;  . HERNIA REPAIR    . HYSTEROSCOPY     fibroids  . LAPAROSCOPIC CHOLECYSTECTOMY    . LAPAROSCOPY     fibroids  . NISSEN FUNDOPLICATION  4174Y  . RIGHT/LEFT HEART CATH AND CORONARY ANGIOGRAPHY N/A 02/20/2018   Procedure: RIGHT/LEFT HEART CATH AND CORONARY ANGIOGRAPHY;  Surgeon: [name], MD;  Location: [place] CV LAB;  Service: Cardiovascular;  Laterality: N/A;  . TONSILLECTOMY      There were no vitals filed for this visit.   Subjective Assessment - 07/02/18 1025    Subjective  I cannot even walk a block and breathe. Started getting really bad about 8 mo ago. I have 2 buldging disks. Climbing into high bead makes breathing labored. Laying on my Right side at night makes me short of breath.     How long can you walk comfortably?  about a block- not comfortably    Currently in Pain?  No/denies         [facility] PT Assessment - 07/02/18 0001      Assessment   Medical Diagnosis  aortic stenosis  Referring Provider (PT)  Burnell Blanks, MD    Onset Date/Surgical Date  --   about 8 mo ago   Hand Dominance  Right      Precautions   Precaution Comments  shortness of breath      Restrictions   Weight Bearing Restrictions  No      Balance Screen   Has the patient fallen in the past 6 months  No      [place] residence    Living Arrangements  Spouse/significant other    Additional Comments  no stairs at home      Prior Function   Level of [place]  Retired      Associate Professor   Overall Cognitive Status  Within Functional Limits for tasks assessed      Observation/Other Assessments   Focus on Therapeutic Outcomes (FOTO)    --   N/A     Sensation   Additional Comments  WFL      Posture/Postural Control   Posture Comments  Decreased thoracic curve with forard head, increasedlumbar lordosis      ROM / Strength   AROM / PROM / Strength  AROM;Strength      AROM   Overall AROM Comments  Mild limitation in Rt GHJ due to pain otherwise [facility]      Strength   Overall Strength Comments  gross 5/5    Strength Assessment Site  Hand    Right/Left hand  Right;Left    Right Hand Grip (lbs)  45    Left Hand Grip (lbs)  30       [facility] Pre-Surgical Assessment - 07/02/18 0001    5 Meter Walk Test- trial 1  4 sec    5 Meter Walk Test- trial 2  4 sec.     5 Meter Walk Test- trial 3  4 sec.    5 meter walk test average  4 sec    4 Stage Balance Test Position  4    Sit To Stand Test- trial 1  10 sec.    6 Minute Walk- Baseline  yes    BP (mmHg)  111/59    HR (bpm)  69    02 Sat (%RA)  94 %    Modified Borg Scale for Dyspnea  0- Nothing at all    Perceived Rate of Exertion (Borg)  6-    6 Minute Walk Post Test  yes    BP (mmHg)  135/58    HR (bpm)  75    02 Sat (%RA)  90 %    Modified Borg Scale for Dyspnea  5- Strong or hard breathing    Perceived Rate of Exertion (Borg)  15- Hard    Aerobic Endurance Distance Walked  645    Endurance additional comments  58% disability compared to age related normative values              Objective measurements completed on examination: See above findings.              PT Education - 07/02/18 1113    Education Details  sports bra for support at night, need for training after TAVR to return to prior level of endurance befre symptoms    Person(s) Educated  Patient;Spouse    Methods  Explanation    Comprehension  Verbalized understanding  Plan - 07/02/18 1114    PT Frequency  --   one-time TAVR evaluation   Consulted and Agree with Plan of Care  Patient;Family member/caregiver    Family Member Consulted  Husband        Clinical Impression Statement: Pt is a 73 yo F presenting to OP PT for evaluation prior to possible TAVR surgery due to severe aortic stenosis. Pt reports onset of SOB approx 8 mo ago ago. Symptoms are  Limiting endurance for functional activities. Pt presents with WFL ROM and strength and complains of musculoskeletal pain in her lower back and Right hip.  Pt ambulated 304 feet in 1:50 before requesting a seated rest beak lasting 1:30. At time of rest, patient's HR was 84 bpm and O2 was 96% on room air. Pt reported 3/10 shortness of breath on modified scale for dyspnea.. Pt ambulated 341 feet in 2:00 before requesting a seated rest beak lasting the last 44 seconds of the test. At time of rest, patient's HR was 90 bpm and O2 was 95% on room air. Pt reported 5/10 shortness of breath on modified scale for dyspnea.  Pt ambulated a total of 645 feet in 6 minute walk and reported 5/10 SOB on modified scale for dyspena and 15/20 RPE on Borg's perceived exertion and pain scale at the end of the walk. During the 6 minute walk test, patient's HR increased to 90 BPM and O2 saturation decreased to 95%. Based on the Short Physical Performance Battery, patient has a frailty rating of 12/12 with </= 5/12 considered frail.  Visit Diagnosis: Difficulty in walking, not elsewhere classified     Problem List Patient Active Problem List   Diagnosis Date Noted  . CAD in native artery 04/22/2018  . CAD (coronary artery disease), native coronary artery 04/20/2018  . Cough   . Gastroesophageal reflux disease   . Pulmonary hypertension ([place]) 02/21/2018  . Essential hypertension 07/15/2017  . Type 2 diabetes mellitus without complication, with long-term current use of insulin ([place]) 07/15/2017  . Type 2 diabetes mellitus with hyperglycemia, with long-term current use of insulin ([place]) 07/01/2017  . Genetic testing 06/20/2017  . History of colonic polyps 05/22/2017  . Type 2 diabetes mellitus without complication,  without long-term current use of insulin ([place]) 02/26/2017  . Elevated liver function tests 12/05/2016  . Type 2 diabetes mellitus with hypoglycemia without coma, with long-term current use of insulin ([place]) 12/05/2016  . Dyspnea on exertion 07/19/2016  . Chest tightness 07/04/2016  . Thalassemia minor 05/29/2016  . Moderate aortic valve stenosis 12/20/2015  . Left bundle branch block 12/06/2015  . Upper airway cough syndrome 10/14/2015  . Diastolic CHF ([place]) 40/01/2724  . Pain of right lower leg 06/29/2015  . Myalgia 02/17/2014  . Carotid artery stenosis 06/05/2013  . Eustachian tube dysfunction 05/07/2013  . Neuropathy of leg 03/07/2012  . Anemia, unspecified 01/31/2012  . Physical exam, annual 01/20/2012  . Fatigue 01/16/2012  . Hot flashes 08/24/2010  . Essential hypertension, benign 07/20/2010  . Diabetes mellitus type 2, controlled ([place]) 06/30/2010  . Obesity 12/21/2009  . Vitamin D deficiency 03/10/2009  . Hypothyroidism 12/13/2008  . Dyslipidemia 12/13/2008  . Anxiety state 12/13/2008  . Other specified disorders of bladder 12/13/2008  . Disorder of bone and cartilage 12/13/2008   Khori Underberg C. Shanira Tine PT, DPT 07/02/18 11:22 AM   [facility] [address] [place], [place], [address] Phone: [phone]   Fax:  [phone]  Name: Breanna Webster  MRN: [id number] Date of Birth: [birth year]

## 2018-07-04 ENCOUNTER — Other Ambulatory Visit (HOSPITAL_COMMUNITY): Payer: Medicare Other

## 2018-07-04 NOTE — Telephone Encounter (Signed)
Spoke to Breanna Webster and informed him that the pt is due for cpx.  Breanna Webster is getting ready to have surgery on 07/08/2018 for a valve replacement.  Filled medication for 90 days.  Nothing further needed.

## 2018-07-04 NOTE — Pre-Procedure Instructions (Signed)
Breanna Webster  07/04/2018      [id number] - [place], [place] [address] AT [place] [address] [place] [address] Phone: [phone] Fax: [place] [address] [place] - [address] [place] [place] [address] Phone: [phone] Fax: [phone]  [facility] [address] [place] - [place] [address] Phone: [phone] Fax: [phone]    Your procedure is scheduled on March 17  Report to [place] at [employer] A.M.  Call this number if you have problems the morning of surgery:  [phone]   Remember:  Do not eat or drink after midnight.     There are no medications that you need to take the morning of surgery  Follow your surgeon's instructions on when to stop clopidogrel (PLAVIX) .  If no instructions were given by your surgeon then you will need to call the office to get those instructions.    7 days prior to surgery STOP taking any Aspirin (unless otherwise instructed by your surgeon), Aleve, Naproxen, Ibuprofen, Motrin, Advil, Goody's, BC's, all herbal medications, fish oil, and all vitamins.   WHAT DO I DO ABOUT MY DIABETES MEDICATION?   [place] Do not take oral diabetes medicines (pills) the morning of surgery.  . THE NIGHT BEFORE SURGERY, take _____60______ units of __insulin NPH Human (NOVOLIN N RELION)_________insulin.      . If your CBG is greater than 220 mg/dL, you may take  of your sliding scale (correction) dose of insulin.   How to Manage Your Diabetes Before and After Surgery  Why is it important to control my blood sugar before and after surgery? . Improving blood sugar levels before and after surgery helps healing and can limit problems. . A way of improving blood sugar control is eating a healthy diet by: o   Eating less sugar and carbohydrates o  Increasing activity/exercise o  Talking with your doctor about reaching your blood sugar goals . High blood sugars (greater than 180 mg/dL) can raise your risk of infections and slow your recovery, so you will need to focus on controlling your diabetes during the weeks before surgery. . Make sure that the doctor who takes care of your diabetes knows about your planned surgery including the date and location.  How do I manage my blood sugar before surgery? . Check your blood sugar at least 4 times a day, starting 2 days before surgery, to make sure that the level is not too high or low. o Check your blood sugar the morning of your surgery when you wake up and every 2 hours until you get to the Short Stay unit. . If your blood sugar is less than 70 mg/dL, you will need to treat for low blood sugar: o Do not take insulin. o Treat a low blood sugar (less than 70 mg/dL) with  cup of clear juice (cranberry or apple), 4 glucose tablets, OR glucose gel. o Recheck blood sugar in 15 minutes after treatment (to make sure it is greater than 70 mg/dL). If your blood sugar is not greater than 70 mg/dL on recheck, call [phone] for further instructions. . Report your blood sugar to the short stay nurse when you get to Short Stay.  . If you are admitted to the hospital after surgery: o Your blood sugar will be checked  by the staff and you will probably be given insulin after surgery (instead of oral diabetes medicines) to make sure you have good blood sugar levels. o The goal for blood sugar control after surgery is 80-180 mg/dL.    Do not wear jewelry, make-up or nail polish.  Do not wear lotions, powders, or perfumes, or deodorant.  Do not shave 48 hours prior to surgery.   Do not bring valuables to the hospital.  [name] is not responsible for any belongings or valuables.  Contacts, dentures or bridgework may not be worn into surgery.  Leave your suitcase  in the car.  After surgery it may be brought to your room.  For patients admitted to the hospital, discharge time will be determined by your treatment team.  Patients discharged the day of surgery will not be allowed to drive home.    Special instructions:   [place]- Preparing For Surgery  Before surgery, you can play an important role. Because skin is not sterile, your skin needs to be as free of germs as possible. You can reduce the number of germs on your skin by washing with CHG (chlorahexidine gluconate) Soap before surgery.  CHG is an antiseptic cleaner which kills germs and bonds with the skin to continue killing germs even after washing.    Oral Hygiene is also important to reduce your risk of infection.  Remember - BRUSH YOUR TEETH THE MORNING OF SURGERY WITH YOUR REGULAR TOOTHPASTE  Please do not use if you have an allergy to CHG or antibacterial soaps. If your skin becomes reddened/irritated stop using the CHG.  Do not shave (including legs and underarms) for at least 48 hours prior to first CHG shower. It is OK to shave your face.  Please follow these instructions carefully.   1. Shower the NIGHT BEFORE SURGERY and the MORNING OF SURGERY with CHG.   2. If you chose to wash your hair, wash your hair first as usual with your normal shampoo.  3. After you shampoo, rinse your hair and body thoroughly to remove the shampoo.  4. Use CHG as you would any other liquid soap. You can apply CHG directly to the skin and wash gently with a scrungie or a clean washcloth.   5. Apply the CHG Soap to your body ONLY FROM THE NECK DOWN.  Do not use on open wounds or open sores. Avoid contact with your eyes, ears, mouth and genitals (private parts). Wash Face and genitals (private parts)  with your normal soap.  6. Wash thoroughly, paying special attention to the area where your surgery will be performed.  7. Thoroughly rinse your body with warm water from the neck down.  8. DO NOT  shower/wash with your normal soap after using and rinsing off the CHG Soap.  9. Pat yourself dry with a CLEAN TOWEL.  10. Wear CLEAN PAJAMAS to bed the night before surgery, wear comfortable clothes the morning of surgery  11. Place CLEAN SHEETS on your bed the night of your first shower and DO NOT SLEEP WITH PETS.    Day of Surgery:  Do not apply any deodorants/lotions.  Please wear clean clothes to the hospital/surgery center.   Remember to brush your teeth WITH YOUR REGULAR TOOTHPASTE.    Please read over the following fact sheets that you were given.

## 2018-07-06 ENCOUNTER — Encounter: Payer: Self-pay | Admitting: Surgery

## 2018-07-06 NOTE — Progress Notes (Signed)
Patient ID: Breanna Webster, female   DOB: [birth year], 73 y.o.   MRN: [id number]  [facility] CONSULTATION REPORT  Referring Provider is [name], MD Primary Cardiologist is Sinclair Grooms, MD PCP is [name], NP  Chief Complaint  Patient presents with   Aortic Stenosis    eval for TAVR,,all required studies have been completed    HPI:  The patient is a 73 year old woman with a history of hypertension, diabetes, hypothyroidism, coronary artery disease status post PCI with a DES to the RCA in 03/2018, and what was felt to be moderate aortic stenosis followed by Dr. Daneen Schick.  She has had progressive exertional fatigue and dyspnea over the past couple years which has progressed over the past 6 months or so.  She has been followed by Dr. Melvyn Novas for her shortness of breath.  She had pulmonary function testing done by Dr. Melvyn Novas in 2018 which showed moderate restriction.  She had a a ventilation/perfusion scan done which showed no evidence of pulmonary embolism and no findings to suggest pulmonary hypertension.  She was evaluated by GI medicine to assess if reflux may be causing her symptoms.  She had a modified barium swallow in July 2019 which showed no signs of reflux and normal swallowing function.  She had an echocardiogram in August 2019 which showed a trileaflet aortic valve with moderate thickening and calcification.  There was some restriction of mobility.  The peak velocity across the valve was 3.46 m/s with a mean gradient of 26 mmHg and a peak gradient of 48 mmHg.  Left ventricular systolic function was normal with ejection fraction of 65 to 70% and grade 1 diastolic dysfunction.  She underwent cardiac catheterization by Dr. Tamala Julian on 02/20/2018 which showed a 70 to 80% mid RCA stenosis.  The LAD, left main, circumflex were widely patent.  There was normal left ventricular systolic function with a  normal LVEDP of 15 mmHg.  There is moderately severe pulmonary hypertension with a PA pressure of 66/41 with a pulmonary capillary wedge pressure mean of 18.  The peak to peak gradient across aortic valve was 21 mmHg with a mean gradient of 78mHg suggesting that the aortic stenosis may not be playing a major role in her pulmonary hypertension or shortness of breath.  She subsequently underwent PCI of the RCA on 04/22/2018 but did not have any improvement in her symptoms afterwards.  She continued to have progressive exertional fatigue and shortness of breath.  She never had any chest pain.  She then underwent a dobutamine stress echo on 05/14/2018.  This showed a mean aortic gradient of 17 mmHg at baseline which increased to 40 mmHg at peak stress with dobutamine 20 mcg/kg/min.  The peak aortic gradient increased to 61 mmHg.  The patient is here today with her husband.  She is retired and lives in [place]  She says that she used to be able to walk several miles per day but now cannot walk more than 1 block without resting.  She has not had any chest pain.  She denies dizziness and syncope.  She has had no lower extremity edema.  Past Medical History:  Diagnosis Date   Arthritis    "back" (04/22/2018)   CAD (coronary artery disease)    a. 03/2018 s/p PCI/DES to the RCA (3.0x15 Onyx DES).   Carotid artery stenosis    Mild   Chronic lower back pain  Colon polyps    Diverticulitis    Diverticulosis    GERD (gastroesophageal reflux disease)    Grave's disease    History of colonic polyps 05/22/2017   History of hiatal hernia    Hypertension    Hypothyroidism    IBS (irritable bowel syndrome)    Osteopenia    Pneumonia    "I've had it 3 times" (04/22/2018)   Thalassemia minor    Type II diabetes mellitus ([place])     Past Surgical History:  Procedure Laterality Date   24 HOUR [place] STUDY N/A 03/03/2018   Procedure: 24 HOUR [place] STUDY;  Surgeon: Mauri Pole, MD;   Location: [facility];  Service: Endoscopy;  Laterality: N/A;   COLONOSCOPY W/ BIOPSIES AND POLYPECTOMY     CORONARY ANGIOGRAPHY Right 04/21/2018   Procedure: CORONARY ANGIOGRAPHY (CATH LAB);  Surgeon: [name], MD;  Location: [facility];  Service: Cardiovascular;  Laterality: Right;   CORONARY STENT INTERVENTION N/A 04/22/2018   Procedure: CORONARY STENT INTERVENTION;  Surgeon: [name], MD;  Location: [facility];  Service: Cardiovascular;  Laterality: N/A;   DILATION AND CURETTAGE OF UTERUS     ESOPHAGEAL MANOMETRY N/A 03/03/2018   Procedure: ESOPHAGEAL MANOMETRY (EM);  Surgeon: Mauri Pole, MD;  Location: [facility];  Service: Endoscopy;  Laterality: N/A;   HERNIA REPAIR     HYSTEROSCOPY     fibroids   LAPAROSCOPIC CHOLECYSTECTOMY     LAPAROSCOPY     fibroids   NISSEN FUNDOPLICATION  [id number]   RIGHT/LEFT HEART CATH AND CORONARY ANGIOGRAPHY N/A 02/20/2018   Procedure: RIGHT/LEFT HEART CATH AND CORONARY ANGIOGRAPHY;  Surgeon: [name], MD;  Location: [facility];  Service: Cardiovascular;  Laterality: N/A;   TONSILLECTOMY      Family History  Adopted: Yes  Problem Relation Age of Onset   Healthy Son        x 2   Headache Other        Cluster headaches   Heart failure Mother    Colon cancer Neg Hx    Pancreatic cancer Neg Hx    Rectal cancer Neg Hx    Stomach cancer Neg Hx     Social History   Socioeconomic History   Marital status: Married    Spouse name: Not on file   Number of children: 2   Years of education: Not on file   Highest education level: Not on file  Occupational History   Occupation: Tree surgeon of the [employer] resource strain: Not on file   Food insecurity:    Worry: Not on file    Inability: Not on file   Transportation needs:    Medical: Not on file    Non-medical: Not on file  Tobacco Use   Smoking status: Never Smoker    Smokeless tobacco: Never Used  Substance and Sexual Activity   Alcohol use: No    Alcohol/week: 0.0 standard drinks   Drug use: Never   Sexual activity: Not Currently  Lifestyle   Physical activity:    Days per week: Not on file    Minutes per session: Not on file   Stress: Not on file  Relationships   Social connections:    Talks on phone: Not on file    Gets together: Not on file    Attends religious service: Not on file    Active member of club or organization: Not  on file    Attends meetings of clubs or organizations: Not on file    Relationship status: Not on file   Intimate partner violence:    Fear of current or ex partner: Not on file    Emotionally abused: Not on file    Physically abused: Not on file    Forced sexual activity: Not on file  Other Topics Concern   Not on file  Social History Narrative   Lives with husband in a one story home.     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   Education: college.    Current Outpatient Medications  Medication Sig Dispense Refill   aspirin 81 MG tablet Take 81 mg by mouth daily.       chlorpheniramine (CHLOR-TRIMETON) 4 MG tablet Take 4 mg by mouth at bedtime.      cholecalciferol (VITAMIN D) 1000 units tablet Take 1,000 Units by mouth daily.     clopidogrel (PLAVIX) 75 MG tablet Take 1 tablet (75 mg total) by mouth daily with breakfast. 30 tablet 6   hydrochlorothiazide (MICROZIDE) 12.5 MG capsule Take 12.5 mg by mouth daily.     insulin NPH Human (NOVOLIN N RELION) 100 UNIT/ML injection Inject 1.2 mLs (120 Units total) into the skin every morning. And syringes 1/day 40 mL 11   Insulin Syringe-Needle U-100 (BD INSULIN SYRINGE U/F) 31G X 5/16" 1 ML MISC USE TO INJECT INSULIN ONCE DAILY 90 each 2   Multiple Vitamin (MULITIVITAMIN WITH MINERALS) TABS Take 1 tablet by mouth daily.     nitroGLYCERIN (NITROSTAT) 0.4 MG SL tablet Place 1 tablet  (0.4 mg total) under the tongue every 5 (five) minutes as needed for chest pain. 25 tablet 3   nortriptyline (PAMELOR) 25 MG capsule Take 1 capsule (25 mg total) by mouth at bedtime. 90 capsule 1   ONETOUCH DELICA LANCETS 34V MISC USE TO CHECK BLOOD SUGAR TWICE DAILY 200 each 0   ONETOUCH VERIO test strip USE TO CHECK BLOOD SUGAR TWICE DAILY 200 each 0   pantoprazole (PROTONIX) 40 MG tablet Take 1 tablet (40 mg total) by mouth 2 (two) times daily. 60 tablet 2   acetaminophen (TYLENOL) 500 MG tablet Take 1,000 mg by mouth every 6 (six) hours as needed (for pain.).     bisoprolol (ZEBETA) 5 MG tablet TAKE 1 TABLET BY MOUTH EVERY DAY 90 tablet 0   escitalopram (LEXAPRO) 20 MG tablet TAKE 1 TABLET BY MOUTH EVERY DAY 90 tablet 0   losartan (COZAAR) 50 MG tablet TAKE 1 TABLET(50 MG) BY MOUTH DAILY 90 tablet 0   SYNTHROID 175 MCG tablet TAKE 1 TABLET BY MOUTH EVERY DAY (Patient taking differently: Take 175 mcg by mouth daily before breakfast. ) 90 tablet 0   No current facility-administered medications for this visit.     Allergies  Allergen Reactions   Statins Other (See Comments)    Muscle aches      Review of Systems:   General:  normal appetite, decreased energy, + weight gain, no weight loss, no fever  Cardiac:  no chest pain with exertion, no chest pain at rest, +SOB with mild exertion, no resting SOB, no PND, + orthopnea, no palpitations, no arrhythmia, no atrial fibrillation, no LE edema, no dizzy spells, no syncope  Respiratory:  + exertional shortness of breath, no home oxygen, no productive cough, no dry cough, no bronchitis, no wheezing, no hemoptysis, no asthma, no  pain with inspiration or cough, no sleep apnea, no CPAP at night  GI:   no difficulty swallowing, no reflux, no frequent heartburn, no hiatal hernia, no abdominal pain, no constipation, no diarrhea, no hematochezia, no hematemesis, no melena  GU:   no dysuria,  no frequency, no urinary tract infection, no  hematuria,  no kidney stones, no kidney disease  Vascular:  no pain suggestive of claudication, no pain in feet, no leg cramps, no varicose veins, no DVT, no non-healing foot ulcer  Neuro:   no stroke, no TIA's, no seizures, no headaches, no temporary blindness one eye,  no slurred speech, no peripheral neuropathy, no chronic pain, no instability of gait, no memory/cognitive dysfunction  Musculoskeletal: + arthritis, no joint swelling, no myalgias, no difficulty walking, normal mobility   Skin:   no rash, no itching, no skin infections, no pressure sores or ulcerations  Psych:   no anxiety, no depression, no nervousness, no unusual recent stress  Eyes:   no blurry vision, no floaters, no recent vision changes, + wears glasses or contacts  ENT:   no hearing loss, no loose or painful teeth, no dentures, last saw dentist this year  Hematologic:  no easy bruising, no abnormal bleeding, no clotting disorder, no frequent epistaxis  Endocrine:  + diabetes, does  check CBG's at home       Physical Exam:   BP 111/61 (BP Location: Right Arm, Patient Position: Sitting, Cuff Size: Large)    Pulse 72    Resp 16    Ht 5' 5"  (1.651 m)    Wt 205 lb (93 kg)    SpO2 94% Comment: ON RA   BMI 34.11 kg/m   General:  well-appearing  HEENT:  Unremarkable, NCAT, PERLA, EOMI, oropharynx clear  Neck:   no JVD, no bruits, no adenopathy or thyromegaly  Chest:   clear to auscultation, symmetrical breath sounds, no wheezes, no rhonchi   CV:   RRR, grade III/VI crescendo/decrescendo murmur heard best at RSB,  no diastolic murmur  Abdomen:  soft, non-tender, no masses or organomegaly  Extremities:  warm, well-perfused, pulses palpable in feet, no LE edema  Rectal/GU  Deferred  Neuro:   Grossly non-focal and symmetrical throughout  Skin:   Clean and dry, no rashes, no breakdown   Diagnostic Tests:      [facility]                        [address], [place] [address]                             [phone]  ------------------------------------------------------------------- Transthoracic Echocardiography  Patient:    Breanna Webster, Breanna Webster MR #:       [id number] Study Date: 12/09/2017 Gender:     F Age:        48 Height:     165.1 cm Weight:     91.4 kg BSA:        2.08 m^2 Pt. Status: Room:   [facility]  ATTENDING    Candee Furbish, M.D.  SONOGRAPHER  Wyatt Mage, RDCS  PERFORMING   [facility], Outpatient  cc:  ------------------------------------------------------------------- LV EF: 65% -  70%  ------------------------------------------------------------------- Indications:      Dyspnea on exertion (R06.09).  ------------------------------------------------------------------- History:   PMH:   Dyspnea.  Congestive heart failure.  Aortic valve disease.  Risk factors:  Cardiomegaly. Carotid stenosis. LBBB. Palpitation. Hypertension. Diabetes mellitus. Dyslipidemia.  ------------------------------------------------------------------- Study Conclusions  - Procedure narrative: Transthoracic echocardiography. Image   quality was suboptimal. The study was technically difficult, as a   result of poor acoustic windows, poor sound wave transmission,   and body habitus. Intravenous contrast (Definity) was   administered. - Left ventricle: The cavity size was normal. Systolic function was   vigorous. The estimated ejection fraction was in the range of 65%   to 70%. Wall motion was normal; there were no regional wall   motion abnormalities. Doppler parameters are consistent with   abnormal left ventricular relaxation (grade 1 diastolic   dysfunction). - Aortic valve: Trileaflet; moderately thickened, moderately   calcified leaflets. Valve mobility was restricted. There was   moderate stenosis. Peak velocity (S): 346 cm/s. Mean gradient   (S): 26 mm Hg. Peak gradient (S): 48 mm Hg. Valve area (VTI):    1.19 cm^2. - Mitral valve: Severely calcified annulus. Valve area by pressure   half-time: 2.44 cm^2. - Left atrium: The atrium was mildly dilated. Volume/bsa, ES,   (1-plane Simpson&'s, A2C): 41.3 ml/m^2.  Impressions:  - Aortic stenosis has advanced when compared to prior.  ------------------------------------------------------------------- Labs, prior tests, procedures, and surgery: Transthoracic echocardiography (12/14/2015).    The aortic valve showed mild stenosis.  EF was 60%. Aortic valve: peak gradient of 29 mm Hg and mean gradient of 15 mm Hg.  ------------------------------------------------------------------- Study data:  Comparison was made to the study of 12/14/2015.  Study status:  Routine.  Procedure:  The patient reported no pain pre or post test. Transthoracic echocardiography. Image quality was suboptimal. The study was technically difficult, as a result of poor acoustic windows, poor sound wave transmission, and body habitus. Intravenous contrast (Definity) was administered.  Study completion:  There were no complications.          Transthoracic echocardiography.  M-mode, complete 2D, spectral Doppler, and color Doppler.  Birthdate:  Patient birthdate: [birth year].  Age:  Patient is 73 yr old.  Sex:  Gender: female.    BMI: 33.5 kg/m^2.  Blood pressure:     116/76  Patient status:  Outpatient.  Study date: Study date: 12/09/2017. Study time: 11:29 AM.  Location:   [facility]  -------------------------------------------------------------------  ------------------------------------------------------------------- Left ventricle:  The cavity size was normal. Systolic function was vigorous. The estimated ejection fraction was in the range of 65% to 70%. Wall motion was normal; there were no regional wall motion abnormalities. Doppler parameters are consistent with abnormal left ventricular relaxation (grade 1 diastolic  dysfunction).  ------------------------------------------------------------------- Aortic valve:   Trileaflet; moderately thickened, moderately calcified leaflets. Valve mobility was restricted.  Doppler: There was moderate stenosis.   There was no regurgitation.    VTI ratio of LVOT to aortic valve: 0.38. Valve area (VTI): 1.19 cm^2. Indexed valve area (VTI): 0.57 cm^2/m^2. Peak velocity ratio of LVOT to aortic valve: 0.34. Valve area (Vmax): 1.05 cm^2. Indexed valve area (Vmax): 0.5 cm^2/m^2. Mean velocity ratio of LVOT to aortic valve: 0.35. Valve area (Vmean): 1.09 cm^2. Indexed valve area (Vmean): 0.52 cm^2/m^2.    Mean gradient (S): 26 mm Hg. Peak gradient (S): 48 mm Hg.  ------------------------------------------------------------------- Aorta:  Aortic root: The aortic root was normal in size.  ------------------------------------------------------------------- Mitral valve:   Severely calcified  annulus. Mobility was not restricted.  Doppler:  Transvalvular velocity was within the normal range. There was no evidence for stenosis. There was no regurgitation.    Valve area by pressure half-time: 2.44 cm^2. Indexed valve area by pressure half-time: 1.17 cm^2/m^2.    Peak gradient (D): 6 mm Hg.  ------------------------------------------------------------------- Left atrium:  The atrium was mildly dilated.  ------------------------------------------------------------------- Right ventricle:  The cavity size was normal. Wall thickness was normal. Systolic function was normal.  ------------------------------------------------------------------- Pulmonic valve:   Poorly visualized.  Structurally normal valve. Cusp separation was normal.  Doppler:  Transvalvular velocity was within the normal range. There was no evidence for stenosis. There was no regurgitation.  ------------------------------------------------------------------- Tricuspid valve:   Structurally normal  valve.    Doppler: Transvalvular velocity was within the normal range. There was no regurgitation.  ------------------------------------------------------------------- Pulmonary artery:   The main pulmonary artery was normal-sized. Systolic pressure was within the normal range.  ------------------------------------------------------------------- Right atrium:  The atrium was normal in size.  ------------------------------------------------------------------- Pericardium:  There was no pericardial effusion.  ------------------------------------------------------------------- Systemic veins: Inferior vena cava: The vessel was normal in size.  ------------------------------------------------------------------- Measurements   Left ventricle                            Value          Reference  LV ID, ED, PLAX chordal                   52    mm       43 - 52  LV ID, ES, PLAX chordal                   34    mm       23 - 38  LV fx shortening, PLAX chordal            35    %        >=29  LV PW thickness, ED                       10    mm       ---------  IVS/LV PW ratio, ED                       1.1            <=1.3  Stroke volume, 2D                         98    ml       ---------  Stroke volume/bsa, 2D                     47    ml/m^2   ---------  LV e&', lateral                            5.87  cm/s     ---------  LV E/e&', lateral                          20.44          ---------  LV e&', medial  5     cm/s     ---------  LV E/e&', medial                           24             ---------  LV e&', average                            5.44  cm/s     ---------  LV E/e&', average                          22.08          ---------    Ventricular septum                        Value          Reference  IVS thickness, ED                         11    mm       ---------    LVOT                                      Value          Reference  LVOT ID, S                                 20    mm       ---------  LVOT area                                 3.14  cm^2     ---------  LVOT peak velocity, S                     116   cm/s     ---------  LVOT mean velocity, S                     83    cm/s     ---------  LVOT VTI, S                               31.2  cm       ---------  LVOT peak gradient, S                     5     mm Hg    ---------    Aortic valve                              Value          Reference  Aortic valve peak velocity, S             346   cm/s     ---------  Aortic valve mean velocity, S             239   cm/s     ---------  Aortic valve VTI, S                       82.6  cm       ---------  Aortic mean gradient, S                   26    mm Hg    ---------  Aortic peak gradient, S                   48    mm Hg    ---------  VTI ratio, LVOT/AV                        0.38           ---------  Aortic valve area, VTI                    1.19  cm^2     ---------  Aortic valve area/bsa, VTI                0.57  cm^2/m^2 ---------  Velocity ratio, peak, LVOT/AV             0.34           ---------  Aortic valve area, peak velocity          1.05  cm^2     ---------  Aortic valve area/bsa, peak               0.5   cm^2/m^2 ---------  velocity  Velocity ratio, mean, LVOT/AV             0.35           ---------  Aortic valve area, mean velocity          1.09  cm^2     ---------  Aortic valve area/bsa, mean               0.52  cm^2/m^2 ---------  velocity    Aorta                                     Value          Reference  Aortic root ID, ED                        34    mm       ---------    Left atrium                               Value          Reference  LA ID, A-P, ES                            55    mm       ---------  LA ID/bsa, A-P                    (H)     2.64  cm/m^2   <=2.2  LA volume, S  72.8  ml       ---------  LA volume/bsa, S                          34.9  ml/m^2   ---------  LA  volume, ES, 1-p A4C                    58    ml       ---------  LA volume/bsa, ES, 1-p A4C                27.8  ml/m^2   ---------  LA volume, ES, 1-p A2C                    86.1  ml       ---------  LA volume/bsa, ES, 1-p A2C                41.3  ml/m^2   ---------    Mitral valve                              Value          Reference  Mitral E-wave peak velocity               120   cm/s     ---------  Mitral A-wave peak velocity               168   cm/s     ---------  Mitral deceleration time          (H)     306   ms       150 - 230  Mitral pressure half-time                 90    ms       ---------  Mitral peak gradient, D                   6     mm Hg    ---------  Mitral E/A ratio, peak                    0.7            ---------  Mitral valve area, PHT, DP                2.44  cm^2     ---------  Mitral valve area/bsa, PHT, DP            1.17  cm^2/m^2 ---------    Systemic veins                            Value          Reference  Estimated CVP                             3     mm Hg    ---------    Right ventricle                           Value          Reference  TAPSE  18.5  mm       ---------  RV s&', lateral, S                         9.03  cm/s     ---------  Legend: (L)  and  (H)  mark values outside specified reference range.  ------------------------------------------------------------------- Prepared and Electronically Authenticated by  Candee Furbish, M.D. 2019-08-19T13:47:42   Physicians   Panel Physicians Referring Physician Case Authorizing Physician  [name], MD (Primary)    Procedures   RIGHT/LEFT HEART CATH AND CORONARY ANGIOGRAPHY  Conclusion    70 to 80% mid RCA  Widely patent LAD, left main, and circumflex.  Normal LV systolic function.  LVEDP normal at 15 mmHg.  Estimated EF 55%.  Moderately severe pulmonary hypertension with PA systolic pressure 66 mmHg and PA mean pressure 41 mmHg.  Pulmonary  capillary wedge mean pressure 18 mmHg  Right atrial mean pressure 11 mmHg  Mild to moderate aortic stenosis with calculated aortic valve area 1.21 cm based upon a peak to peak gradient of 21 mmHg and mean gradient of 18 mmHg.  This would suggest that the patient's aortic valve is probably not playing a major role in production of pulmonary hypertension or dyspnea.  RECOMMENDATIONS:   Exclude recurrent pulmonary emboli.  Exclude other causes of pulmonary hypertension such as connective tissue disease.  Consider obstructive sleep apnea.  Eventual PCI with stenting of the right coronary.  This should probably be done after the patient has had what ever pulmonary and GI procedures.  Stenting would prevent discontinuation of dual antiplatelet therapy for at least 6 months and possibly a year.  Plan to speak with providers involved and make a decision about when to proceed with PCI.  Aortic valve is not severely involved enough to recommend therapy at this time.      Surgeon Notes     04/21/2018 8:38 AM CV Procedure signed by [name], MD    02/20/2018 12:27 PM CV Procedure signed by [name], MD  Indications   Aortic stenosis, moderate [I35.0 (ICD-10-CM)]  Coronary artery disease involving native coronary artery of native heart without angina pectoris [I25.10 (ICD-10-CM)]  Procedural Details   Technical Details The right radial area was sterilely prepped and draped. Intravenous sedation with Versed and fentanyl was administered. 1% Xylocaine was infiltrated to achieve local analgesia. Using real-time vascular ultrasound, a double wall stick with an angiocath was utilized to obtain intra-arterial access. A VUS image was saved for the permanent record.The modified Seldinger technique was used to place a 13F " Slender" sheath in the right radial artery. Weight based heparin was administered. Coronary angiography was done using 5 F catheters. Right coronary angiography was  performed with a JR4. Left ventricular hemodymic recordings and angiography was done using the JR 4 catheter and hand injection. Left coronary angiography was performed with a JL 3.5 cm.  Right heart catheterization was performed by exchanging a previously placed antecubital IV angio-cath for a 5 French Slender sheath. 1% Xylocaine was used to locally nesthetize the area around the IV site. The IV catheter was wired using an .018 guidewire. The modified Seldinger technique was used to place the 5 [place] sheath. Double glove technique was used to enhance sterility. After sheath insertion, right heart cath was performed using a 5 French balloon tipped catheter and fluoroscopic guidance. Pressures were recorded in each chamber and in the pulmonary capillary wedge position.. The main  pulmonary artery O2 saturation was sampled.   Hemostasis was achieved using a pneumatic band.  During this procedure the patient is administered a total of Versed 2 mg and Fentanyl 125 mg to achieve and maintain moderate conscious sedation. The patient's heart rate, blood pressure, and oxygen saturation are monitored continuously during the procedure. The period of conscious sedation is 69 minutes, of which I was present face-to-face 100% of this time.   Estimated blood loss <50 mL.  During this procedure the patient was administered the following to achieve and maintain moderate conscious sedation: Versed 2 mg, Fentanyl 125 mcg, while the patient's heart rate, blood pressure, and oxygen saturation were continuously monitored. The period of conscious sedation was 69 minutes, of which I was present face-to-face 100% of this time.  Medications  (Filter: Administrations occurring from 02/20/18 1118 to 02/20/18 1232)  Medication Rate/Dose/Volume Action  Date Time   Heparin (Porcine) in NaCl 1000-0.9 UT/500ML-% SOLN (mL) 500 mL Given 02/20/18 1128   Total dose as of 07/06/18 0956        500 mL        Heparin (Porcine) in NaCl  1000-0.9 UT/500ML-% SOLN (mL) 500 mL Given 02/20/18 1128   Total dose as of 07/06/18 0956        500 mL        fentaNYL (SUBLIMAZE) injection (mcg) 25 mcg Given 02/20/18 1136   Total dose as of 07/06/18 0956        25 mcg        midazolam (VERSED) injection (mg) 1 mg Given 02/20/18 1136   Total dose as of 07/06/18 0956        1 mg        lidocaine (PF) (XYLOCAINE) 1 % injection (mL) 2 mL Given 02/20/18 1143   Total dose as of 07/06/18 0956 2 mL Given 1144   4 mL        Radial Cocktail/Verapamil only (mL) 10 mL Given 02/20/18 1151   Total dose as of 07/06/18 0956        10 mL        heparin injection (Units) 4,500 Units Given 02/20/18 1203   Total dose as of 07/06/18 0956        4,500 Units        iohexol (OMNIPAQUE) 350 MG/ML injection (mL) 80 mL Given 02/20/18 1220   Total dose as of 07/06/18 0956        80 mL        Sedation Time   Sedation Time Physician-1: 42 minutes 17 seconds  Coronary Findings   Diagnostic  Dominance: Right  Right Coronary Artery  Prox RCA to Mid RCA lesion 75% stenosed  Prox RCA to Mid RCA lesion is 75% stenosed.  Intervention   No interventions have been documented.  Right Heart   Right Heart Pressures Hemodynamic findings consistent with moderate pulmonary hypertension.  Wall Motion   Resting       All segments of the heart are normal.          Left Heart   Left Ventricle The left ventricular size is normal. The left ventricular systolic function is normal. LV end diastolic pressure is normal. The left ventricular ejection fraction is 55-65% by visual estimate.  Aortic Valve There is moderate aortic valve stenosis. There is no aortic valve regurgitation. The aortic valve is calcified. There is restricted aortic valve motion.  Coronary Diagrams   Diagnostic  Dominance: Right    Intervention  Implants    No implant documentation for this case.  Syngo Images   Show images for CARDIAC CATHETERIZATION  MERGE Images   Show  images for CARDIAC CATHETERIZATION   Link to Procedure Log   Procedure Log    Hemo Data    Most Recent Value  Fick Cardiac Output 6.24 L/min  Fick Cardiac Output Index 3.15 (L/min)/BSA  Aortic Mean Gradient 18 mmHg  Aortic Peak Gradient 21 mmHg  Aortic Valve Area 1.21  Aortic Value Area Index 0.61 cm2/BSA  RA A Wave 17 mmHg  RA V Wave 12 mmHg  RA Mean 11 mmHg  RV Systolic Pressure 70 mmHg  RV Diastolic Pressure 9 mmHg  RV EDP 17 mmHg  PA Systolic Pressure 66 mmHg  PA Diastolic Pressure 23 mmHg  PA Mean 41 mmHg  PW A Wave 21 mmHg  PW V Wave 20 mmHg  PW Mean 18 mmHg  AO Systolic Pressure 353 mmHg  AO Diastolic Pressure 72 mmHg  AO Mean 614 mmHg  LV Systolic Pressure 431 mmHg  LV Diastolic Pressure 9 mmHg  LV EDP 15 mmHg  AOp Systolic Pressure 540 mmHg  AOp Diastolic Pressure 63 mmHg  AOp Mean Pressure 97 mmHg  LVp Systolic Pressure 086 mmHg  LVp Diastolic Pressure 14 mmHg  LVp EDP Pressure 18 mmHg  QP/QS 1  TPVR Index 13.01 HRUI  TSVR Index 32.06 HRUI  PVR SVR Ratio 0.26  TPVR/TSVR Ratio 0.41    Physicians   Panel Physicians Referring Physician Case Authorizing Physician  [name], MD (Primary)    Procedures   CORONARY STENT INTERVENTION  Conclusion     A stent was successfully placed.    Successful stent mid RCA reducing 85% stenosis to less than 10% using a 3.0 x 15 Onyx postdilated to 3.25 mm diameter.  Stenosis was reduced to less than 10%.  Mynx closure with success.  RECOMMENDATIONS:   Discharge in a.m.  Continue all other medications as previous.  Aspirin and Plavix, uninterrupted for 6 months.                 *[facility]*                         [address] [place], [place] [address]                            [phone]  ------------------------------------------------------------------- Stress Echocardiography  Patient:    Breanna Webster, Breanna Webster MR #:       [id number] Study Date: 05/14/2018 Gender:     F Age:        89 Height:     165 cm Weight:     93.4 kg BSA:        2.11 m^2 Pt. Status: Room:   Ivar Bury, MD  PERFORMING   [facility], Outpatient  SONOGRAPHER  Darlina Sicilian, RDCS  cc:  -------------------------------------------------------------------  ------------------------------------------------------------------- Indications:      Aortic Stenosis.  ------------------------------------------------------------------- History:   PMH:  dyspnea on exertion, aortic stenosis, left bundle branch block, diabetes mellitus II, hyperlipidemia, gastroesophageal reflux, and CAD with recent  RCA DES January 2020. Medications:  No other medications.  ------------------------------------------------------------------- Study Conclusions  - Aortic valve: Valve area (VTI): 1.03 cm^2. - Stress ECG conclusions: There were no stress arrhythmias or   conduction abnormalities. The stress ECG was non-diagnostic. due   to LBBB.  Impressions:  - There is moderate aortic stenosis at baseline.   There is a positive inotrope response to dobutamine marked by   increased aortic stroke volume, consistent with significant   myocardial reserve.   The increase in gradients (without a relative change in estimated   aortic valve area) is consistent with true aortic stenosis.   However, the study did not meet criteria for severe aortic   stenosis.     2D images acquired during dobutamine stress are limited and are   of poor technical quality. No assessment of LV function, ischemia   or regional wall motion can be made on this study. Baseline   mitral valve gradients were not acquired. At peak dobutamine   dose/peak cardiac output, the mean transmitral gradient is 7 mm   Hg (heart rate 85 bpm). There is probably at most mild mitral   stenosis at  baseline.  ------------------------------------------------------------------- Study data:   Study status:  Routine.  Consent:  The risks, benefits, and alternatives to the procedure were explained to the patient and informed consent was obtained.  Procedure:  The patient reported no pain pre or post test. Initial setup. The patient was brought to the laboratory. A baseline ECG was recorded. Surface ECG leads and automatic cuff blood pressure measurements were monitored.  Dobutamine stress test. Stress testing was performed, with dobutamine infusion to a maximum of 20 mcg/kg/min. Transthoracic stress echocardiography for assessment of valvular function. Image quality was poor. The study was technically limited due to poor acoustic window availability. Images were captured at baseline, low dose, peak dose, and recovery.  Study completion: The patient tolerated the procedure well. There were no complications.          Dobutamine. Stress echocardiography.  2D. Birthdate:  Patient birthdate: 29-Dec-1945.  Age:  Patient is 73 yr old.  Sex:  Gender: female.    BMI: 34.3 kg/m^2.  Blood pressure:   135/82  Patient status:  Outpatient.  Study date:  Study date: 05/14/2018. Study time: 10:10 AM.  -------------------------------------------------------------------  ------------------------------------------------------------------- Aortic valve:   Doppler:     VTI ratio of LVOT to aortic valve: 0.45. Valve area (VTI): 1.03 cm^2. Indexed valve area (VTI): 0.49 cm^2/m^2. Peak velocity ratio of LVOT to aortic valve: 0.47. Valve area (Vmax): 1.06 cm^2. Indexed valve area (Vmax): 0.5 cm^2/m^2. Mean velocity ratio of LVOT to aortic valve: 0.44. Valve area (Vmean): 1 cm^2. Indexed valve area (Vmean): 0.47 cm^2/m^2.    Mean gradient (S): 34 mm Hg. Peak gradient (S): 54 mm Hg.  ------------------------------------------------------------------- Mitral valve:   Doppler:     Valve area by pressure  half-time: 2.53 cm^2. Indexed valve area by pressure half-time: 1.2 cm^2/m^2. Valve area by continuity equation (using LVOT flow): 1.61 cm^2. Indexed valve area by continuity equation (using LVOT flow): 0.76 cm^2/m^2.    Mean gradient (D): 7 mm Hg.   ------------------------------------------------------------------- Baseline ECG:   Normal sinus rhythm with left bundle branch block.   ------------------------------------------------------------------- Stress protocol:  +--------+--------+ !Stage   !Symptoms! +--------+--------+ !Baseline!None    ! +--------+--------+  ------------------------------------------------------------------- Stress ECG:  There were no stress arrhythmias or conduction abnormalities.  The stress ECG was non-diagnostic.   due to LBBB.   ------------------------------------------------------------------- Baseline: Peak  aortic gradient 28 mm Hg, mean aortic gradient 17 mm Hg, dimensionless index 0.44 .  Dobutamine 5 ug/kg/min: Peak aortic gradient 45 mm Hg, mean aortic gradient 28 mm Hg, dimensionless index 0.40. Dobutamine 20 ug/kg/min: Peak aortic gradient 61 mm Hg, mean aortic gradient 40 mm Hg, dimensionless index 0.44. Recovery:  ------------------------------------------------------------------- Measurements   Left ventricle                           Value           Reference  Stroke volume, 2D                        61     ml       ---------  Stroke volume/bsa, 2D                    29     ml/m^2   ---------  LV filling time, D, DP                   310    ms       ---------  LV e&', lateral                           4.79   cm/s     ---------  LV E/e&', lateral                         14.41           ---------  LV e&', medial                            3.81   cm/s     ---------  LV E/e&', medial                          18.11           ---------  LV e&', average                           4.3    cm/s     ---------  LV E/e&', average                          16.05           ---------    LVOT                                     Value           Reference  LVOT ID, S                               17     mm       ---------  LVOT area                                2.27   cm^2     ---------  LVOT peak velocity, S  171    cm/s     ---------  LVOT mean velocity, S                    123    cm/s     ---------  LVOT VTI, S                              26.8   cm       ---------  LVOT peak gradient, S                    12     mm Hg    ---------    Aortic valve                             Value           Reference  Aortic valve peak velocity, S            366    cm/s     ---------  Aortic valve mean velocity, S            280    cm/s     ---------  Aortic valve VTI, S                      59.3   cm       ---------  Aortic mean gradient, S                  34     mm Hg    ---------  Aortic peak gradient, S                  54     mm Hg    ---------  VTI ratio, LVOT/AV                       0.45            ---------  Aortic valve area, VTI                   1.03   cm^2     ---------  Aortic valve area/bsa, VTI               0.49   cm^2/m^2 ---------  Velocity ratio, peak, LVOT/AV            0.47            ---------  Aortic valve area, peak velocity         1.06   cm^2     ---------  Aortic valve area/bsa, peak velocity     0.5    cm^2/m^2 ---------  Velocity ratio, mean, LVOT/AV            0.44            ---------  Aortic valve area, mean velocity         1      cm^2     ---------  Aortic valve area/bsa, mean velocity     0.47   cm^2/m^2 ---------    Mitral valve                             Value  Reference  Mitral E-wave peak velocity              69     cm/s     ---------  Mitral A-wave peak velocity              185    cm/s     ---------  Mitral mean velocity, D                  122.37 cm/s     ---------  Mitral deceleration time             (H) 310    ms       150 - 230  Mitral pressure half-time                 87     ms       ---------  Mitral mean gradient, D                  7      mm Hg    ---------  Mitral E/A ratio, peak                   0.4             ---------  Mitral valve area, PHT, DP               2.53   cm^2     ---------  Mitral valve area/bsa, PHT, DP           1.2    cm^2/m^2 ---------  Mitral valve area, LVOT continuity       1.61   cm^2     ---------  Mitral valve area/bsa, LVOT              0.76   cm^2/m^2 ---------  continuity  Mitral annulus VTI, D                    38.8   cm       ---------  Legend: (L)  and  (H)  mark values outside specified reference range.  ------------------------------------------------------------------- Prepared and Electronically Authenticated by  Sanda Klein, MD 2020-01-23T18:06:35    ADDENDUM REPORT: 06/17/2018 12:48  EXAM: OVER-READ INTERPRETATION  CT CHEST  The following report is an over-read performed by radiologist Dr. Samara Snide [facility] on 06/17/2018. This over-read does not include interpretation of cardiac or coronary anatomy or pathology. The cardiac CTA interpretation by the cardiologist is attached.  COMPARISON:  02/28/2018 chest radiograph.  FINDINGS: Please see the separate concurrent chest CT angiogram report for details.  IMPRESSION: Please see the separate concurrent chest CT angiogram report for details.   Electronically Signed   By: Ilona Sorrel M.D.   On: 06/17/2018 12:48   Addended by Sharyn Blitz, MD on 06/17/2018 12:50 PM    Study Result   CLINICAL DATA:  Aortic Stenosis  EXAM: Cardiac TAVR CT  TECHNIQUE: The patient was scanned on a Siemens Force 810 slice scanner. A 120 kV retrospective scan was triggered in the ascending thoracic aorta at 140 HU's. Gantry rotation speed was 250 msecs and collimation was .6 mm. No beta blockade or nitro were given. The 3D data set was reconstructed in 5% intervals of the R-R cycle. Systolic and diastolic  phases were analyzed on a dedicated work station using MPR, MIP and VRT modes. The patient received 80 cc of contrast.  FINDINGS: Aortic Valve: Calcified  tri leaflet with restricted motion  Aorta: Bovine arch moderate calcific atherosclerosis  Sino-tubular Junction: 24 mm  Ascending Thoracic Aorta: 30 mm  Aortic Arch: 24 mm  Descending Thoracic Aorta: 22 mm  Sinus of Valsalva Measurements:  Non-coronary: 29.4 mm  Right - coronary: 26.5 mm  Left -   coronary: 27.9 mm  Coronary Artery Height above Annulus:  Left Main: 10.3 mm above annulus  Right Coronary: 15.6 mm above annulus  Virtual Basal Annulus Measurements:  Maximum / Minimum Diameter: 26.1 mm x 17.9 mm  Perimeter: 71 mm  Area: 350 mm2  Coronary Arteries: Sufficient height above annulus for deployment  Optimum Fluoroscopic Angle for Delivery: LAO 9 Caudal 11 degrees  IMPRESSION: 1. Tri leaflet AV with annular area 350 mm2 suitable for a 23 mm Sapien 3 valve  2. Optimum angiographic angle for deployment LAO 9 Caudal 11 degrees  3.  Coronary arteries sufficient height above annulus for deployment  4. Normal aortic root 3.0 cm with bovine arch and moderate calcific atherosclerosis  Jenkins Rouge  Electronically Signed: By: Jenkins Rouge M.D. On: 06/17/2018 11:37       CLINICAL DATA:  73 year old female with severe symptomatic aortic stenosis. Pre-TAVR evaluation.  EXAM: CT ANGIOGRAPHY CHEST, ABDOMEN AND PELVIS  TECHNIQUE: Multidetector CT imaging through the chest, abdomen and pelvis was performed using the standard protocol during bolus administration of intravenous contrast. Multiplanar reconstructed images and MIPs were obtained and reviewed to evaluate the vascular anatomy.  CONTRAST:  156m ISOVUE-370 IOPAMIDOL (ISOVUE-370) INJECTION 76%  COMPARISON:  02/28/2018 chest radiograph. 09/18/2011 CT abdomen/pelvis.  FINDINGS: CTA CHEST  FINDINGS  Cardiovascular: Mild to moderate cardiomegaly. No significant pericardial effusion/thickening. Thickening and calcification of the aortic valve and aortic annulus. Three-vessel coronary atherosclerosis. Thick mitral annular calcification. Atherosclerotic nonaneurysmal thoracic aorta. Normal caliber pulmonary arteries. No central pulmonary emboli.  Mediastinum/Nodes: Calcified subcentimeter right thyroid lobe nodule. Mild circumferential wall thickening in the lower thoracic esophagus, not definitely changed from 09/18/2011 CT. No pathologically enlarged axillary, mediastinal or hilar lymph nodes.  Lungs/Pleura: No pneumothorax. No pleural effusion. No acute consolidative airspace disease or lung masses. 2 scattered small solid pulmonary nodules in the right lung, largest 3 mm in the right middle lobe (series 14/image 70)  Musculoskeletal: No aggressive appearing focal osseous lesions. Mild thoracic spondylosis.  CTA ABDOMEN AND PELVIS FINDINGS  Hepatobiliary: Liver surface is questionably finely irregular, can not exclude hepatic cirrhosis. No liver masses. Cholecystectomy. Bile ducts are stable and within normal post cholecystectomy limits.  Pancreas: Diffuse fatty infiltration of the pancreas. No pancreatic mass or duct dilation.  Spleen: Normal size. No mass.  Adrenals/Urinary Tract: Normal adrenals. Simple 2.7 cm interpolar left renal cyst. No suspicious renal masses. No hydronephrosis. Normal bladder.  Stomach/Bowel: Status post Nissen fundoplication. Stomach is nondistended and otherwise normal. Normal caliber small bowel with no small bowel wall thickening. Normal appendix. Moderate diffuse colonic diverticulosis, most prominent in the sigmoid colon, with no large bowel wall thickening or significant pericolonic fat stranding.  Vascular/Lymphatic: Atherosclerotic nonaneurysmal abdominal aorta. No pathologically enlarged lymph nodes in the  abdomen or pelvis.  Reproductive: Stable mildly enlarged myomatous uterus. No adnexal masses.  Other: No pneumoperitoneum, ascites or focal fluid collection.  Musculoskeletal: No aggressive appearing focal osseous lesions. Moderate lumbar spondylosis.  VASCULAR MEASUREMENTS PERTINENT TO TAVR:  AORTA:  Minimal Aortic Diameter-10.9 x 10.1 mm (infrarenal abdominal aorta on series 13/image 115)  Severity of Aortic Calcification-moderate to severe  RIGHT PELVIS:  Right Common Iliac Artery -  Minimal  Diameter-8.8 x 7.4 mm  Tortuosity-mild  Calcification-mild  Right External Iliac Artery -  Minimal Diameter-7.5 x 7.2 mm  Tortuosity-mild  Calcification-none  Right Common Femoral Artery -  Minimal Diameter-8.2 x 6.9 mm  Tortuosity-mild  Calcification-mild-to-moderate  LEFT PELVIS:  Left Common Iliac Artery -  Minimal Diameter-9.6 x 9.4 mm  Tortuosity-mild-to-moderate  Calcification-mild-to-moderate  Left External Iliac Artery -  Minimal Diameter-7.8 x 7.0 mm  Tortuosity-mild-to-moderate  Calcification-none  Left Common Femoral Artery -  Minimal Diameter-8.3 x 6.2 mm  Tortuosity-mild  Calcification-mild-to-moderate  Review of the MIP images confirms the above findings.  IMPRESSION: 1. Vascular findings and measurements pertinent to potential TAVR procedure, as detailed. 2. Thickening and calcification of the aortic valve, compatible with the reported clinical history of severe symptomatic aortic stenosis. 3. Mild-to-moderate cardiomegaly. 4. Three-vessel coronary atherosclerosis. 5. Nonspecific chronic circumferential wall thickening in the lower thoracic esophagus, presumably due to reflux esophagitis, cannot exclude Barrett's esophagus or esophageal neoplasm by CT. 6. Questionable fine liver surface irregularity, can not exclude hepatic cirrhosis. Consider hepatic elastography for further liver fibrosis  risk stratification, as clinically warranted. 7. Tiny right pulmonary nodules, largest 3 mm. No follow-up needed if patient is low-risk (and has no known or suspected primary neoplasm). Non-contrast chest CT can be considered in 12 months if patient is high-risk. This recommendation follows the consensus statement: Guidelines for Management of Incidental Pulmonary Nodules Detected on CT Images:From the Fleischner Society 2017; published online before print (10.1148/radiol.6301601093). 8. Moderate diffuse colonic diverticulosis. 9. Aortic Atherosclerosis (ICD10-I70.0).   Electronically Signed   By: Ilona Sorrel M.D.   On: 06/17/2018 13:28   STS Risk Score: AVR  Risk of Mortality: 1.724%  Renal Failure: 3.161%  Permanent Stroke: 1.766%  Prolonged Ventilation: 8.712%  DSW Infection: 0.093%  Reoperation: 3.758%  Morbidity or Mortality: 13.828%  Short Length of Stay: 25.853% Long Length of Stay: 6.134%   Impression:  This 73 year old woman has stage D, severe, symptomatic aortic stenosis with New York Heart Association class II-III symptoms of exertional fatigue and shortness of breath consistent with chronic diastolic congestive heart failure.  Her symptoms have been progressing despite having PCI of a significant right coronary stenosis.  She has never had any anginal symptoms.  I have personally reviewed her 2D echo, dobutamine stress echo, cardiac catheterization, and CTA studies.  Her echocardiogram shows a trileaflet aortic valve with moderate thickening and calcification of the leaflets with some restriction of mobility.  The mean gradient across aortic valve was 26 mmHg with a peak gradient of 48 mmHg suggesting moderate stenosis.  Left ventricular systolic function was normal with grade 1 diastolic dysfunction.  She has a severely calcified mitral annulus with no evidence of mitral stenosis or regurgitation.  Cardiac catheterization showed a 70 to 80% mid RCA stenosis with  widely patent left main, LAD, left circumflex.  She was noted to have moderate pulmonary hypertension with a PA pressure of 66/23 with a mean pulmonary capillary wedge pressure of 18.  The mean gradient across aortic valve was 18 mmHg with a peak to peak gradient of 21 mmHg.  This exam would suggest moderate aortic stenosis.  Due to her progressive symptoms she had a dobutamine stress echocardiogram which showed a marked increase in aortic stroke-volume at peak stress consistent with significant myocardial reserve and an increase in the mean aortic gradient from 17 mmHg at baseline to 40 mmHg at peak stress consistent with severe aortic stenosis.  I think she has low gradient, normal EF, severe  aortic stenosis.  I think aortic valve replacement is indicated in this patient for relief of her symptoms and to prevent progressive left ventricular deterioration.  She would be a low risk patient for open surgical aortic valve replacement.  She would rather have transcatheter aortic valve replacement and I think that is a reasonable alternative at her age of 65 years with obesity and diabetes.  Her gated cardiac CTA shows anatomy suitable for transcatheter aortic valve replacement using a Sapien 3 valve.  Her abdominal and pelvic CTA shows adequate pelvic vascular anatomy to allow transfemoral insertion.  The patient and her husband were counseled at length regarding treatment alternatives for management of severe symptomatic aortic stenosis. The risks and benefits of surgical intervention has been discussed in detail. Long-term prognosis with medical therapy was discussed. Alternative approaches such as conventional surgical aortic valve replacement, transcatheter aortic valve replacement, and palliative medical therapy were compared and contrasted at length. This discussion was placed in the context of the patient's own specific clinical presentation and past medical history. All of their questions have been  addressed.  Following the decision to proceed with transcatheter aortic valve replacement, a discussion was held regarding what types of management strategies would be attempted intraoperatively in the event of life-threatening complications, including whether or not the patient would be considered a candidate for the use of cardiopulmonary bypass and/or conversion to open sternotomy for attempted surgical intervention. The patient is aware of the fact that transient use of cardiopulmonary bypass may be necessary.  I think she would be a candidate for median sternotomy if needed to address any intraoperative complications.  The patient has been advised of a variety of complications that might develop including but not limited to risks of death, stroke, paravalvular leak, aortic dissection or other major vascular complications, aortic annulus rupture, device embolization, cardiac rupture or perforation, mitral regurgitation, acute myocardial infarction, arrhythmia, heart block or bradycardia requiring permanent pacemaker placement, congestive heart failure, respiratory failure, renal failure, pneumonia, infection, other late complications related to structural valve deterioration or migration, or other complications that might ultimately cause a temporary or permanent loss of functional independence or other long term morbidity. The patient provides full informed consent for the procedure as described and all questions were answered.      Plan:  She will be scheduled for transfemoral transcatheter aortic valve replacement on Tuesday, 07/08/2018.   I spent 60 minutes performing this consultation and > 50% of this time was spent face to face counseling and coordinating the care of this patient's severe symptomatic aortic stenosis.     Gaye Pollack, MD 07/02/2018

## 2018-07-07 ENCOUNTER — Encounter (HOSPITAL_COMMUNITY): Payer: Self-pay

## 2018-07-07 ENCOUNTER — Ambulatory Visit: Payer: Medicare Other | Admitting: Endocrinology

## 2018-07-07 ENCOUNTER — Encounter (HOSPITAL_COMMUNITY)
Admission: RE | Admit: 2018-07-07 | Discharge: 2018-07-07 | Disposition: A | Discharge status: Home / Self Care | Payer: Medicare Other | Source: Ambulatory Visit | Attending: Cardiovascular Disease | Admitting: Cardiovascular Disease

## 2018-07-07 ENCOUNTER — Other Ambulatory Visit: Payer: Self-pay

## 2018-07-07 ENCOUNTER — Telehealth: Payer: Self-pay | Admitting: Endocrinology

## 2018-07-07 ENCOUNTER — Ambulatory Visit (HOSPITAL_COMMUNITY)
Admission: RE | Admit: 2018-07-07 | Discharge: 2018-07-07 | Disposition: A | Discharge status: Home / Self Care | Payer: Medicare Other | Source: Ambulatory Visit | Attending: Cardiovascular Disease | Admitting: Cardiovascular Disease

## 2018-07-07 DIAGNOSIS — I350 Nonrheumatic aortic (valve) stenosis: Secondary | ICD-10-CM

## 2018-07-07 DIAGNOSIS — Z01810 Encounter for preprocedural cardiovascular examination: Secondary | ICD-10-CM | POA: Diagnosis not present

## 2018-07-07 LAB — URINALYSIS, ROUTINE W REFLEX MICROSCOPIC
Bilirubin Urine: NEGATIVE
Glucose, UA: 100 mg/dL — AB
Hgb urine dipstick: NEGATIVE
Ketones, ur: NEGATIVE mg/dL
Leukocytes,Ua: NEGATIVE
Nitrite: NEGATIVE
Protein, ur: NEGATIVE mg/dL
Specific Gravity, Urine: 1.01 (ref 1.005–1.030)
pH: 6 (ref 5.0–8.0)

## 2018-07-07 LAB — COMPREHENSIVE METABOLIC PANEL
ALT: 80 U/L — ABNORMAL HIGH (ref 0–44)
AST: 83 U/L — ABNORMAL HIGH (ref 15–41)
Albumin: 3.6 g/dL (ref 3.5–5.0)
Alkaline Phosphatase: 90 U/L (ref 38–126)
Anion gap: 10 (ref 5–15)
BUN: 17 mg/dL (ref 8–23)
CO2: 22 mmol/L (ref 22–32)
Calcium: 9.9 mg/dL (ref 8.9–10.3)
Chloride: 103 mmol/L (ref 98–111)
Creatinine, Ser: 0.88 mg/dL (ref 0.44–1.00)
GFR calc Af Amer: >60 mL/min (ref >60)
GFR calc non Af Amer: >60 mL/min (ref >60)
Glucose, Bld: 274 mg/dL — ABNORMAL HIGH (ref 70–99)
Potassium: 4.2 mmol/L (ref 3.5–5.1)
Sodium: 135 mmol/L (ref 135–145)
Total Bilirubin: 0.6 mg/dL (ref 0.3–1.2)
Total Protein: 7.2 g/dL (ref 6.5–8.1)

## 2018-07-07 LAB — CBC
HCT: 38.1 % (ref 36.0–46.0)
Hemoglobin: 11 g/dL — ABNORMAL LOW (ref 12.0–15.0)
MCH: 18.7 pg — ABNORMAL LOW (ref 26.0–34.0)
MCHC: 28.9 g/dL — ABNORMAL LOW (ref 30.0–36.0)
MCV: 64.7 fL — ABNORMAL LOW (ref 80.0–100.0)
Platelets: 232 10*3/uL (ref 150–400)
RBC: 5.89 MIL/uL — ABNORMAL HIGH (ref 3.87–5.11)
RDW: 15.2 % (ref 11.5–15.5)
WBC: 7.7 10*3/uL (ref 4.0–10.5)
nRBC: 0 % (ref 0.0–0.2)

## 2018-07-07 LAB — APTT: aPTT: 33 seconds (ref 24–36)

## 2018-07-07 LAB — HEMOGLOBIN A1C
Hgb A1c MFr Bld: 9.3 % — ABNORMAL HIGH (ref 4.8–5.6)
Mean Plasma Glucose: 220.21 mg/dL

## 2018-07-07 LAB — SURGICAL PCR SCREEN
MRSA, PCR: NEGATIVE
Staphylococcus aureus: POSITIVE — AB

## 2018-07-07 LAB — PROTIME-INR
INR: 1.1 (ref 0.8–1.2)
Prothrombin Time: 14.3 seconds (ref 11.4–15.2)

## 2018-07-07 LAB — TYPE AND SCREEN
ABO/RH(D): O POS
Antibody Screen: NEGATIVE

## 2018-07-07 LAB — ABO/RH: ABO/RH(D): O POS

## 2018-07-07 LAB — BRAIN NATRIURETIC PEPTIDE: B Natriuretic Peptide: 112.2 pg/mL — ABNORMAL HIGH (ref 0.0–100.0)

## 2018-07-07 MED ORDER — MAGNESIUM SULFATE 50 % IJ SOLN
40.0000 meq | INTRAMUSCULAR | Status: DC
  Filled 2018-07-07 (×2): qty 9.85

## 2018-07-07 MED ORDER — DEXMEDETOMIDINE HCL IN NACL 400 MCG/100ML IV SOLN
0.1000 ug/kg/h | INTRAVENOUS | Status: AC
  Administered 2018-07-08: 1 ug/kg/h via INTRAVENOUS
  Filled 2018-07-07: qty 100

## 2018-07-07 MED ORDER — VANCOMYCIN HCL 10 G IV SOLR
1500.0000 mg | INTRAVENOUS | Status: AC
  Administered 2018-07-08: 1500 mg via INTRAVENOUS
  Filled 2018-07-07: qty 1500

## 2018-07-07 MED ORDER — SODIUM CHLORIDE 0.9 % IV SOLN
1.5000 g | INTRAVENOUS | Status: DC
  Filled 2018-07-07: qty 1.5

## 2018-07-07 MED ORDER — POTASSIUM CHLORIDE 2 MEQ/ML IV SOLN
80.0000 meq | INTRAVENOUS | Status: DC
  Filled 2018-07-07: qty 40

## 2018-07-07 MED ORDER — SODIUM CHLORIDE 0.9 % IV SOLN
INTRAVENOUS | Status: DC
  Filled 2018-07-07: qty 30

## 2018-07-07 MED ORDER — NOREPINEPHRINE BITARTRATE 1 MG/ML IV SOLN
0.0000 ug/min | INTRAVENOUS | Status: DC
  Filled 2018-07-07: qty 4

## 2018-07-07 NOTE — Telephone Encounter (Signed)
Please schedule f/u appt for next available appointment

## 2018-07-07 NOTE — H&P (Signed)
[address]       [place] [address]             [phone]      Cardiothoracic Surgery Admission History and Physical   Referring Provider is [name], MD Primary Cardiologist is Sinclair Grooms, MD PCP is [name], NP      Chief Complaint  Patient presents with   Aortic Stenosis        HPI:  The patient is a 73 year old woman with a history of hypertension, diabetes, hypothyroidism, coronary artery disease status post PCI with a DES to the RCA in 03/2018, and what was felt to be moderate aortic stenosis followed by Dr. Daneen Schick.  She has had progressive exertional fatigue and dyspnea over the past couple years which has progressed over the past 6 months or so.  She has been followed by Dr. Melvyn Novas for her shortness of breath.  She had pulmonary function testing done by Dr. Melvyn Novas in 2018 which showed moderate restriction.  She had a a ventilation/perfusion scan done which showed no evidence of pulmonary embolism and no findings to suggest pulmonary hypertension.  She was evaluated by GI medicine to assess if reflux may be causing her symptoms.  She had a modified barium swallow in July 2019 which showed no signs of reflux and normal swallowing function.  She had an echocardiogram in August 2019 which showed a trileaflet aortic valve with moderate thickening and calcification.  There was some restriction of mobility.  The peak velocity across the valve was 3.46 m/s with a mean gradient of 26 mmHg and a peak gradient of 48 mmHg.  Left ventricular systolic function was normal with ejection fraction of 65 to 70% and grade 1 diastolic dysfunction.  She underwent cardiac catheterization by Dr. Tamala Julian on 02/20/2018 which showed a 70 to 80% mid RCA stenosis.  The LAD, left main, circumflex were widely patent.  There was normal left ventricular systolic function with a normal LVEDP of 15 mmHg.  There is moderately severe pulmonary hypertension with a PA  pressure of 66/41 with a pulmonary capillary wedge pressure mean of 18.  The peak to peak gradient across aortic valve was 21 mmHg with a mean gradient of 73mHg suggesting that the aortic stenosis may not be playing a major role in her pulmonary hypertension or shortness of breath.  She subsequently underwent PCI of the RCA on 04/22/2018 but did not have any improvement in her symptoms afterwards.  She continued to have progressive exertional fatigue and shortness of breath.  She never had any chest pain.  She then underwent a dobutamine stress echo on 05/14/2018.  This showed a mean aortic gradient of 17 mmHg at baseline which increased to 40 mmHg at peak stress with dobutamine 20 mcg/kg/min.  The peak aortic gradient increased to 61 mmHg.  The patient lives with her husband.  She is retired and lives in [place]  She says that she used to be able to walk several miles per day but now cannot walk more than 1 block without resting.  She has not had any chest pain.  She denies dizziness and syncope.  She has had no lower extremity edema.      Past Medical History:  Diagnosis Date   Arthritis    "back" (04/22/2018)   CAD (coronary artery disease)    a. 03/2018 s/p PCI/DES to the RCA (3.0x15 Onyx DES).   Carotid artery stenosis  Mild   Chronic lower back pain    Colon polyps    Diverticulitis    Diverticulosis    GERD (gastroesophageal reflux disease)    Grave's disease    History of colonic polyps 05/22/2017   History of hiatal hernia    Hypertension    Hypothyroidism    IBS (irritable bowel syndrome)    Osteopenia    Pneumonia    "I've had it 3 times" (04/22/2018)   Thalassemia minor    Type II diabetes mellitus ([place])          Past Surgical History:  Procedure Laterality Date   24 HOUR [place] STUDY N/A 03/03/2018   Procedure: 24 HOUR [place] STUDY;  Surgeon: Mauri Pole, MD;  Location: [facility];  Service: Endoscopy;  Laterality:  N/A;   COLONOSCOPY W/ BIOPSIES AND POLYPECTOMY     CORONARY ANGIOGRAPHY Right 04/21/2018   Procedure: CORONARY ANGIOGRAPHY (CATH LAB);  Surgeon: [name], MD;  Location: [facility];  Service: Cardiovascular;  Laterality: Right;   CORONARY STENT INTERVENTION N/A 04/22/2018   Procedure: CORONARY STENT INTERVENTION;  Surgeon: [name], MD;  Location: [facility];  Service: Cardiovascular;  Laterality: N/A;   DILATION AND CURETTAGE OF UTERUS     ESOPHAGEAL MANOMETRY N/A 03/03/2018   Procedure: ESOPHAGEAL MANOMETRY (EM);  Surgeon: Mauri Pole, MD;  Location: [facility];  Service: Endoscopy;  Laterality: N/A;   HERNIA REPAIR     HYSTEROSCOPY     fibroids   LAPAROSCOPIC CHOLECYSTECTOMY     LAPAROSCOPY     fibroids   NISSEN FUNDOPLICATION  2979G   RIGHT/LEFT HEART CATH AND CORONARY ANGIOGRAPHY N/A 02/20/2018   Procedure: RIGHT/LEFT HEART CATH AND CORONARY ANGIOGRAPHY;  Surgeon: [name], MD;  Location: [facility];  Service: Cardiovascular;  Laterality: N/A;   TONSILLECTOMY           Family History  Adopted: Yes  Problem Relation Age of Onset   Healthy Son        x 2   Headache Other        Cluster headaches   Heart failure Mother    Colon cancer Neg Hx    Pancreatic cancer Neg Hx    Rectal cancer Neg Hx    Stomach cancer Neg Hx     Social History        Socioeconomic History   Marital status: Married    Spouse name: Not on file   Number of children: 2   Years of education: Not on file   Highest education level: Not on file  Occupational History   Occupation: Tree surgeon of the [employer] strain: Not on file   Food insecurity:    Worry: Not on file    Inability: Not on file   Transportation needs:    Medical: Not on file    Non-medical: Not on file  Tobacco Use   Smoking status: Never Smoker    Smokeless tobacco: Never Used  Substance and Sexual Activity   Alcohol use: No    Alcohol/week: 0.0 standard drinks   Drug use: Never   Sexual activity: Not Currently  Lifestyle   Physical activity:    Days per week: Not on file    Minutes per session: Not on file   Stress: Not on file  Relationships   Social connections:    Talks on phone: Not on file  Gets together: Not on file    Attends religious service: Not on file    Active member of club or organization: Not on file    Attends meetings of clubs or organizations: Not on file    Relationship status: Not on file   Intimate partner violence:    Fear of current or ex partner: Not on file    Emotionally abused: Not on file    Physically abused: Not on file    Forced sexual activity: Not on file  Other Topics Concern   Not on file  Social History Narrative   Lives with husband in a one story home.     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   Education: college.          Current Outpatient Medications  Medication Sig Dispense Refill   aspirin 81 MG tablet Take 81 mg by mouth daily.       chlorpheniramine (CHLOR-TRIMETON) 4 MG tablet Take 4 mg by mouth at bedtime.      cholecalciferol (VITAMIN D) 1000 units tablet Take 1,000 Units by mouth daily.     clopidogrel (PLAVIX) 75 MG tablet Take 1 tablet (75 mg total) by mouth daily with breakfast. 30 tablet 6   hydrochlorothiazide (MICROZIDE) 12.5 MG capsule Take 12.5 mg by mouth daily.     insulin NPH Human (NOVOLIN N RELION) 100 UNIT/ML injection Inject 1.2 mLs (120 Units total) into the skin every morning. And syringes 1/day 40 mL 11   Insulin Syringe-Needle U-100 (BD INSULIN SYRINGE U/F) 31G X 5/16" 1 ML MISC USE TO INJECT INSULIN ONCE DAILY 90 each 2   Multiple Vitamin (MULITIVITAMIN WITH MINERALS) TABS Take 1 tablet by mouth daily.      nitroGLYCERIN (NITROSTAT) 0.4 MG SL tablet Place 1 tablet (0.4 mg total) under the tongue every 5 (five) minutes as needed for chest pain. 25 tablet 3   nortriptyline (PAMELOR) 25 MG capsule Take 1 capsule (25 mg total) by mouth at bedtime. 90 capsule 1   ONETOUCH DELICA LANCETS 17B MISC USE TO CHECK BLOOD SUGAR TWICE DAILY 200 each 0   ONETOUCH VERIO test strip USE TO CHECK BLOOD SUGAR TWICE DAILY 200 each 0   pantoprazole (PROTONIX) 40 MG tablet Take 1 tablet (40 mg total) by mouth 2 (two) times daily. 60 tablet 2   acetaminophen (TYLENOL) 500 MG tablet Take 1,000 mg by mouth every 6 (six) hours as needed (for pain.).     bisoprolol (ZEBETA) 5 MG tablet TAKE 1 TABLET BY MOUTH EVERY DAY 90 tablet 0   escitalopram (LEXAPRO) 20 MG tablet TAKE 1 TABLET BY MOUTH EVERY DAY 90 tablet 0   losartan (COZAAR) 50 MG tablet TAKE 1 TABLET(50 MG) BY MOUTH DAILY 90 tablet 0   SYNTHROID 175 MCG tablet TAKE 1 TABLET BY MOUTH EVERY DAY (Patient taking differently: Take 175 mcg by mouth daily before breakfast. ) 90 tablet 0   No current facility-administered medications for this visit.          Allergies  Allergen Reactions   Statins Other (See Comments)    Muscle aches      Review of Systems:              General:                      normal appetite, decreased energy, + weight gain, no weight loss, no  fever             Cardiac:                       no chest pain with exertion, no chest pain at rest, +SOB with mild exertion, no resting SOB, no PND, + orthopnea, no palpitations, no arrhythmia, no atrial fibrillation, no LE edema, no dizzy spells, no syncope             Respiratory:                 + exertional shortness of breath, no home oxygen, no productive cough, no dry cough, no bronchitis, no wheezing, no hemoptysis, no asthma, no pain with inspiration or cough, no sleep apnea, no CPAP at night             GI:                               no difficulty swallowing, no reflux,  no frequent heartburn, no hiatal hernia, no abdominal pain, no constipation, no diarrhea, no hematochezia, no hematemesis, no melena             GU:                              no dysuria,  no frequency, no urinary tract infection, no hematuria,  no kidney stones, no kidney disease             Vascular:                     no pain suggestive of claudication, no pain in feet, no leg cramps, no varicose veins, no DVT, no non-healing foot ulcer             Neuro:                         no stroke, no TIA's, no seizures, no headaches, no temporary blindness one eye,  no slurred speech, no peripheral neuropathy, no chronic pain, no instability of gait, no memory/cognitive dysfunction             Musculoskeletal:         + arthritis, no joint swelling, no myalgias, no difficulty walking, normal mobility              Skin:                            no rash, no itching, no skin infections, no pressure sores or ulcerations             Psych:                         no anxiety, no depression, no nervousness, no unusual recent stress             Eyes:                           no blurry vision, no floaters, no recent vision changes, + wears glasses or contacts             ENT:  no hearing loss, no loose or painful teeth, no dentures, last saw dentist this year             Hematologic:               no easy bruising, no abnormal bleeding, no clotting disorder, no frequent epistaxis             Endocrine:                   + diabetes, does  check CBG's at home                             Physical Exam:              BP 111/61 (BP Location: Right Arm, Patient Position: Sitting, Cuff Size: Large)    Pulse 72    Resp 16    Ht 5' 5"  (1.651 m)    Wt 205 lb (93 kg)    SpO2 94% Comment: ON RA   BMI 34.11 kg/m              General:                      well-appearing             HEENT:                       Unremarkable, NCAT, PERLA, EOMI, oropharynx clear             Neck:                            no JVD, no bruits, no adenopathy or thyromegaly             Chest:                          clear to auscultation, symmetrical breath sounds, no wheezes, no rhonchi              CV:                              RRR, grade III/VI crescendo/decrescendo murmur heard best at RSB,  no diastolic murmur             Abdomen:                    soft, non-tender, no masses or organomegaly             Extremities:                 warm, well-perfused, pulses palpable in feet, no LE edema             Rectal/GU                   Deferred             Neuro:                         Grossly non-focal and symmetrical throughout             Skin:  Clean and dry, no rashes, no breakdown   Diagnostic Tests:  [facility] [address], [place] [address] [phone]  ------------------------------------------------------------------- Transthoracic Echocardiography  Patient: Lana, Flaim MR #: [id number] Study Date: 12/09/2017 Gender: F Age: 38 Height: 165.1 cm Weight: 91.4 kg BSA: 2.08 m^2 Pt. Status: Room:  [facility] ATTENDING Candee Furbish, M.D. SONOGRAPHER Wyatt Mage, RDCS PERFORMING [facility], Outpatient  cc:  ------------------------------------------------------------------- LV EF: 65% - 70%  ------------------------------------------------------------------- Indications: Dyspnea on exertion (R06.09).  ------------------------------------------------------------------- History: PMH: Dyspnea. Congestive heart failure. Aortic valve disease. Risk factors: Cardiomegaly. Carotid stenosis. LBBB. Palpitation. Hypertension. Diabetes mellitus. Dyslipidemia.  ------------------------------------------------------------------- Study  Conclusions  - Procedure narrative: Transthoracic echocardiography. Image quality was suboptimal. The study was technically difficult, as a result of poor acoustic windows, poor sound wave transmission, and body habitus. Intravenous contrast (Definity) was administered. - Left ventricle: The cavity size was normal. Systolic function was vigorous. The estimated ejection fraction was in the range of 65% to 70%. Wall motion was normal; there were no regional wall motion abnormalities. Doppler parameters are consistent with abnormal left ventricular relaxation (grade 1 diastolic dysfunction). - Aortic valve: Trileaflet; moderately thickened, moderately calcified leaflets. Valve mobility was restricted. There was moderate stenosis. Peak velocity (S): 346 cm/s. Mean gradient (S): 26 mm Hg. Peak gradient (S): 48 mm Hg. Valve area (VTI): 1.19 cm^2. - Mitral valve: Severely calcified annulus. Valve area by pressure half-time: 2.44 cm^2. - Left atrium: The atrium was mildly dilated. Volume/bsa, ES, (1-plane Simpson&'s, A2C): 41.3 ml/m^2.  Impressions:  - Aortic stenosis has advanced when compared to prior.  ------------------------------------------------------------------- Labs, prior tests, procedures, and surgery: Transthoracic echocardiography (12/14/2015). The aortic valve showed mild stenosis. EF was 60%. Aortic valve: peak gradient of 29 mm Hg and mean gradient of 15 mm Hg.  ------------------------------------------------------------------- Study data: Comparison was made to the study of 12/14/2015. Study status: Routine. Procedure: The patient reported no pain pre or post test. Transthoracic echocardiography. Image quality was suboptimal. The study was technically difficult, as a result of poor acoustic windows, poor sound wave transmission, and body habitus. Intravenous contrast (Definity) was administered. Study completion: There  were no complications. Transthoracic echocardiography. M-mode, complete 2D, spectral Doppler, and color Doppler. Birthdate: Patient birthdate: July 27, 1945. Age: Patient is 73 yr old. Sex: Gender: female. BMI: 33.5 kg/m^2. Blood pressure: 116/76 Patient status: Outpatient. Study date: Study date: 12/09/2017. Study time: 11:29 AM. Location: [facility]  -------------------------------------------------------------------  ------------------------------------------------------------------- Left ventricle: The cavity size was normal. Systolic function was vigorous. The estimated ejection fraction was in the range of 65% to 70%. Wall motion was normal; there were no regional wall motion abnormalities. Doppler parameters are consistent with abnormal left ventricular relaxation (grade 1 diastolic dysfunction).  ------------------------------------------------------------------- Aortic valve: Trileaflet; moderately thickened, moderately calcified leaflets. Valve mobility was restricted. Doppler: There was moderate stenosis. There was no regurgitation. VTI ratio of LVOT to aortic valve: 0.38. Valve area (VTI): 1.19 cm^2. Indexed valve area (VTI): 0.57 cm^2/m^2. Peak velocity ratio of LVOT to aortic valve: 0.34. Valve area (Vmax): 1.05 cm^2. Indexed valve area (Vmax): 0.5 cm^2/m^2. Mean velocity ratio of LVOT to aortic valve: 0.35. Valve area (Vmean): 1.09 cm^2. Indexed valve area (Vmean): 0.52 cm^2/m^2. Mean gradient (S): 26 mm Hg. Peak gradient (S): 48 mm Hg.  ------------------------------------------------------------------- Aorta: Aortic root: The aortic root was normal in size.  ------------------------------------------------------------------- Mitral valve: Severely calcified annulus. Mobility was not restricted. Doppler: Transvalvular velocity was within the normal range. There was  no evidence for stenosis. There was  no regurgitation. Valve area by pressure half-time: 2.44 cm^2. Indexed valve area by pressure half-time: 1.17 cm^2/m^2. Peak gradient (D): 6 mm Hg.  ------------------------------------------------------------------- Left atrium: The atrium was mildly dilated.  ------------------------------------------------------------------- Right ventricle: The cavity size was normal. Wall thickness was normal. Systolic function was normal.  ------------------------------------------------------------------- Pulmonic valve: Poorly visualized. Structurally normal valve. Cusp separation was normal. Doppler: Transvalvular velocity was within the normal range. There was no evidence for stenosis. There was no regurgitation.  ------------------------------------------------------------------- Tricuspid valve: Structurally normal valve. Doppler: Transvalvular velocity was within the normal range. There was no regurgitation.  ------------------------------------------------------------------- Pulmonary artery: The main pulmonary artery was normal-sized. Systolic pressure was within the normal range.  ------------------------------------------------------------------- Right atrium: The atrium was normal in size.  ------------------------------------------------------------------- Pericardium: There was no pericardial effusion.  ------------------------------------------------------------------- Systemic veins: Inferior vena cava: The vessel was normal in size.  ------------------------------------------------------------------- Measurements  Left ventricle Value Reference LV ID, ED, PLAX chordal 52 mm 43 - 52 LV ID, ES, PLAX chordal 34 mm 23 - 38 LV fx shortening, PLAX chordal 35 % >=29 LV PW thickness, ED 10 mm  --------- IVS/LV PW ratio, ED 1.1 <=1.3 Stroke volume, 2D 98 ml --------- Stroke volume/bsa, 2D 47 ml/m^2 --------- LV e&', lateral 5.87 cm/s --------- LV E/e&', lateral 20.44 --------- LV e&', medial 5 cm/s --------- LV E/e&', medial 24 --------- LV e&', average 5.44 cm/s --------- LV E/e&', average 22.08 ---------  Ventricular septum Value Reference IVS thickness, ED 11 mm ---------  LVOT Value Reference LVOT ID, S 20 mm --------- LVOT area 3.14 cm^2 --------- LVOT peak velocity, S 116 cm/s --------- LVOT mean velocity, S 83 cm/s --------- LVOT VTI, S 31.2 cm --------- LVOT peak gradient, S 5 mm Hg ---------  Aortic valve Value Reference Aortic valve peak velocity, S 346 cm/s --------- Aortic valve mean velocity, S 239 cm/s --------- Aortic valve VTI, S 82.6 cm --------- Aortic mean gradient, S 26 mm Hg --------- Aortic peak gradient, S 48 mm Hg --------- VTI ratio, LVOT/AV 0.38 --------- Aortic valve area, VTI 1.19 cm^2 --------- Aortic valve area/bsa, VTI 0.57 cm^2/m^2 --------- Velocity ratio, peak, LVOT/AV 0.34  --------- Aortic valve area, peak velocity 1.05 cm^2 --------- Aortic valve area/bsa, peak 0.5 cm^2/m^2 --------- velocity Velocity ratio, mean, LVOT/AV 0.35 --------- Aortic valve area, mean velocity 1.09 cm^2 --------- Aortic valve area/bsa, mean 0.52 cm^2/m^2 --------- velocity  Aorta Value Reference Aortic root ID, ED 34 mm ---------  Left atrium Value Reference LA ID, A-P, ES 55 mm --------- LA ID/bsa, A-P (H) 2.64 cm/m^2 <=2.2 LA volume, S 72.8 ml --------- LA volume/bsa, S 34.9 ml/m^2 --------- LA volume, ES, 1-p A4C 58 ml --------- LA volume/bsa, ES, 1-p A4C 27.8 ml/m^2 --------- LA volume, ES, 1-p A2C 86.1 ml --------- LA volume/bsa, ES, 1-p A2C 41.3 ml/m^2 ---------  Mitral valve Value Reference Mitral E-wave peak velocity 120 cm/s --------- Mitral A-wave peak velocity 168 cm/s --------- Mitral deceleration time (H) 306 ms 150 - 230 Mitral pressure half-time 90 ms --------- Mitral peak gradient, D 6 mm Hg --------- Mitral E/A ratio, peak 0.7 --------- Mitral valve area, PHT, DP 2.44 cm^2 --------- Mitral valve area/bsa, PHT, DP 1.17 cm^2/m^2 ---------  Systemic veins Value Reference Estimated CVP 3 mm Hg ---------  Right ventricle  Value Reference TAPSE 18.5 mm --------- RV s&', lateral, S 9.03 cm/s ---------  Legend: (L) and (H) mark values outside specified reference range.  ------------------------------------------------------------------- Prepared and Electronically Authenticated by  Candee Furbish, M.D. 2019-08-19T13:47:42  Physicians   Panel Physicians Referring Physician Case Authorizing Physician  [name], MD (Primary)    Procedures   RIGHT/LEFT HEART CATH AND CORONARY ANGIOGRAPHY  Conclusion    69 to 80% mid RCA  Widely patent LAD, left main, and circumflex.  Normal LV systolic function.LVEDP normal at 15 mmHg. Estimated EF 55%.  Moderately severe pulmonary hypertension with PA systolic pressure 66 mmHg and PA mean pressure 41 mmHg.  Pulmonary capillary wedge mean pressure 18 mmHg  Right atrial mean pressure 11 mmHg  Mild to moderate aortic stenosis with calculated aortic valve area 1.21 cm based upon a peak to peak gradient of 21 mmHg and mean gradient of 18 mmHg. This would suggest that the patient's aortic valve is probably not playing a major role in production of pulmonary hypertension or dyspnea.  RECOMMENDATIONS:   Exclude recurrent pulmonary emboli. Exclude other causes of pulmonary hypertension such as connective tissue disease.  Consider obstructive sleep apnea.  Eventual PCI with stenting of the right coronary. This should probably be done after the patient has had what ever pulmonary and GI procedures. Stenting would prevent discontinuation of dual antiplatelet therapy for at least 6 months and possibly a year.  Plan to speak with providers involved and make a decision about when to proceed with PCI.  Aortic valve is not severely involved enough to recommend therapy at this time.      Surgeon Notes     04/21/2018 8:38 AM CV  Procedure signed by [name], MD    02/20/2018 12:27 PM CV Procedure signed by [name], MD  Indications   Aortic stenosis, moderate [I35.0 (ICD-10-CM)]  Coronary artery disease involving native coronary artery of native heart without angina pectoris [I25.10 (ICD-10-CM)]  Procedural Details   Technical Details The right radial area was sterilely prepped and draped. Intravenous sedation with Versed and fentanyl was administered. 1% Xylocaine was infiltrated to achieve local analgesia. Using real-time vascular ultrasound, a double wall stick with an angiocath was utilized to obtain intra-arterial access. A VUS image was saved for the permanent record.The modified Seldinger technique was used to place a 63F " Slender" sheath in the right radial artery. Weight based heparin was administered. Coronary angiography was done using 5 F catheters. Right coronary angiography was performed with a JR4. Left ventricular hemodymic recordings and angiography was done using the JR 4 catheter and hand injection. Left coronary angiography was performed with a JL 3.5 cm.  Right heart catheterization was performed by exchanging a previously placed antecubital IV angio-cath for a 5 French Slender sheath. 1% Xylocaine was used to locally nesthetize the area around the IV site. The IV catheter was wired using an .018 guidewire. The modified Seldinger technique was used to place the 5 [place] sheath. Double glove technique was used to enhance sterility. After sheath insertion, right heart cath was performed using a 5 French balloon tipped catheter and fluoroscopic guidance. Pressures were recorded in each chamber and in the pulmonary capillary wedge position.. The main pulmonary artery O2 saturation was sampled.   Hemostasis was achieved using a pneumatic band.  During this procedure the patient is administered a total of Versed 2 mg and Fentanyl 125 mg to achieve and maintain moderate conscious sedation. The  patient's heart rate, blood pressure, and oxygen saturation are monitored continuously during the procedure. The period of conscious sedation is 69 minutes, of which I was present face-to-face 100% of this time.   Estimated blood loss <50 mL.  During  this procedure the patient was administered the following to achieve and maintain moderate conscious sedation: Versed 2 mg, Fentanyl 125 mcg, while the patient's heart rate, blood pressure, and oxygen saturation were continuously monitored. The period of conscious sedation was 69 minutes, of which I was present face-to-face 100% of this time.  Medications  (Filter: Administrations occurring from 02/20/18 1118 to 02/20/18 1232)          Medication Rate/Dose/Volume Action  Date Time   Heparin (Porcine) in NaCl 1000-0.9 UT/500ML-% SOLN (mL) 500 mL Given 02/20/18 1128   Total dose as of 07/06/18 0956        500 mL        Heparin (Porcine) in NaCl 1000-0.9 UT/500ML-% SOLN (mL) 500 mL Given 02/20/18 1128   Total dose as of 07/06/18 0956        500 mL        fentaNYL (SUBLIMAZE) injection (mcg) 25 mcg Given 02/20/18 1136   Total dose as of 07/06/18 0956        25 mcg        midazolam (VERSED) injection (mg) 1 mg Given 02/20/18 1136   Total dose as of 07/06/18 0956        1 mg        lidocaine (PF) (XYLOCAINE) 1 % injection (mL) 2 mL Given 02/20/18 1143   Total dose as of 07/06/18 0956 2 mL Given 1144   4 mL        Radial Cocktail/Verapamil only (mL) 10 mL Given 02/20/18 1151   Total dose as of 07/06/18 0956        10 mL        heparin injection (Units) 4,500 Units Given 02/20/18 1203   Total dose as of 07/06/18 0956        4,500 Units        iohexol (OMNIPAQUE) 350 MG/ML injection (mL) 80 mL Given 02/20/18 1220   Total dose as of 07/06/18 0956        80 mL        Sedation Time   Sedation Time Physician-1: 42 minutes 17 seconds  Coronary  Findings   Diagnostic  Dominance: Right  Right Coronary Artery  Prox RCA to Mid RCA lesion 75% stenosed  Prox RCA to Mid RCA lesion is 75% stenosed.  Intervention   No interventions have been documented.  Right Heart   Right Heart Pressures Hemodynamic findings consistent with moderate pulmonary hypertension.  Wall Motion        Resting       All segments of the heart are normal.          Left Heart   Left Ventricle The left ventricular size is normal. The left ventricular systolic function is normal. LV end diastolic pressure is normal. The left ventricular ejection fraction is 55-65% by visual estimate.  Aortic Valve There is moderate aortic valve stenosis. There is no aortic valve regurgitation. The aortic valve is calcified. There is restricted aortic valve motion.  Coronary Diagrams   Diagnostic  Dominance: Right    Intervention   Implants    No implant documentation for this case.  Syngo Images      Show images for CARDIAC CATHETERIZATION  MERGE Images   Show images for CARDIAC CATHETERIZATION   Link to Procedure Log   Procedure Log    Hemo Data    Most Recent Value  Fick Cardiac Output 6.24 L/min  Fick Cardiac Output Index 3.15 (L/min)/BSA  Aortic Mean Gradient 18 mmHg  Aortic Peak Gradient 21 mmHg  Aortic Valve Area 1.21  Aortic Value Area Index 0.61 cm2/BSA  RA A Wave 17 mmHg  RA V Wave 12 mmHg  RA Mean 11 mmHg  RV Systolic Pressure 70 mmHg  RV Diastolic Pressure 9 mmHg  RV EDP 17 mmHg  PA Systolic Pressure 66 mmHg  PA Diastolic Pressure 23 mmHg  PA Mean 41 mmHg  PW A Wave 21 mmHg  PW V Wave 20 mmHg  PW Mean 18 mmHg  AO Systolic Pressure 357 mmHg  AO Diastolic Pressure 72 mmHg  AO Mean 017 mmHg  LV Systolic Pressure 793 mmHg  LV Diastolic Pressure 9 mmHg  LV EDP 15 mmHg  AOp Systolic Pressure 903 mmHg  AOp Diastolic Pressure 63 mmHg  AOp Mean Pressure 97 mmHg  LVp Systolic Pressure 009 mmHg  LVp Diastolic  Pressure 14 mmHg  LVp EDP Pressure 18 mmHg  QP/QS 1  TPVR Index 13.01 HRUI  TSVR Index 32.06 HRUI  PVR SVR Ratio 0.26  TPVR/TSVR Ratio 0.41    Physicians   Panel Physicians Referring Physician Case Authorizing Physician  [name], MD (Primary)    Procedures   CORONARY STENT INTERVENTION  Conclusion     A stent was successfully placed.   Successful stent mid RCA reducing 85% stenosis to less than 10% using a 3.0 x 15 Onyx postdilated to 3.25 mm diameter. Stenosis was reduced to less than 10%.  Mynx closure with success.  RECOMMENDATIONS:   Discharge in a.m.  Continue all other medications as previous.  Aspirin and Plavix, uninterrupted for 6 months.     *[facility]* [address], [place] [address] [phone]  ------------------------------------------------------------------- Stress Echocardiography  Patient: Ivori, Storr MR #: [id number] Study Date: 05/14/2018 Gender: F Age: 64 Height: 165 cm Weight: 93.4 kg BSA: 2.11 m^2 Pt. Status: Room:  Ivar Bury, MD PERFORMING [facility], Outpatient SONOGRAPHER Darlina Sicilian, RDCS  cc:  -------------------------------------------------------------------  ------------------------------------------------------------------- Indications: Aortic Stenosis.  ------------------------------------------------------------------- History: PMH: dyspnea on exertion, aortic stenosis, left bundle branch block, diabetes mellitus II, hyperlipidemia, gastroesophageal reflux, and CAD with recent RCA DES January 2020. Medications: No other medications.  ------------------------------------------------------------------- Study Conclusions  - Aortic valve: Valve area (VTI): 1.03  cm^2. - Stress ECG conclusions: There were no stress arrhythmias or conduction abnormalities. The stress ECG was non-diagnostic. due to LBBB.  Impressions:  - There is moderate aortic stenosis at baseline. There is a positive inotrope response to dobutamine marked by increased aortic stroke volume, consistent with significant myocardial reserve. The increase in gradients (without a relative change in estimated aortic valve area) is consistent with true aortic stenosis. However, the study did not meet criteria for severe aortic stenosis.  2D images acquired during dobutamine stress are limited and are of poor technical quality. No assessment of LV function, ischemia or regional wall motion can be made on this study. Baseline mitral valve gradients were not acquired. At peak dobutamine dose/peak cardiac output, the mean transmitral gradient is 7 mm Hg (heart rate 85 bpm). There is probably at most mild mitral stenosis at baseline.  ------------------------------------------------------------------- Study data: Study status: Routine. Consent: The risks, benefits, and alternatives to the procedure were explained to the patient and informed consent was obtained. Procedure: The patient reported no pain pre or post test. Initial setup. The patient was brought to the laboratory. A baseline ECG was recorded. Surface ECG leads and automatic cuff blood  pressure measurements were monitored. Dobutamine stress test. Stress testing was performed, with dobutamine infusion to a maximum of 20 mcg/kg/min. Transthoracic stress echocardiography for assessment of valvular function. Image quality was poor. The study was technically limited due to poor acoustic window availability. Images were captured at baseline, low dose, peak dose, and recovery. Study completion: The patient tolerated the procedure well. There were no complications. Dobutamine.  Stress echocardiography. 2D. Birthdate: Patient birthdate: [birth year]. Age: Patient is 73 yr old. Sex: Gender: female. BMI: 34.3 kg/m^2. Blood pressure: 135/82 Patient status: Outpatient. Study date: Study date: 05/14/2018. Study time: 10:10 AM.  -------------------------------------------------------------------  ------------------------------------------------------------------- Aortic valve: Doppler: VTI ratio of LVOT to aortic valve: 0.45. Valve area (VTI): 1.03 cm^2. Indexed valve area (VTI): 0.49 cm^2/m^2. Peak velocity ratio of LVOT to aortic valve: 0.47. Valve area (Vmax): 1.06 cm^2. Indexed valve area (Vmax): 0.5 cm^2/m^2. Mean velocity ratio of LVOT to aortic valve: 0.44. Valve area (Vmean): 1 cm^2. Indexed valve area (Vmean): 0.47 cm^2/m^2. Mean gradient (S): 34 mm Hg. Peak gradient (S): 54 mm Hg.  ------------------------------------------------------------------- Mitral valve: Doppler: Valve area by pressure half-time: 2.53 cm^2. Indexed valve area by pressure half-time: 1.2 cm^2/m^2. Valve area by continuity equation (using LVOT flow): 1.61 cm^2. Indexed valve area by continuity equation (using LVOT flow): 0.76 cm^2/m^2. Mean gradient (D): 7 mm Hg.  ------------------------------------------------------------------- Baseline ECG: Normal sinus rhythm with left bundle branch block.  ------------------------------------------------------------------- Stress protocol:  +--------+--------+ !Stage !Symptoms! +--------+--------+ !Baseline!None ! +--------+--------+  ------------------------------------------------------------------- Stress ECG: There were no stress arrhythmias or conduction abnormalities. The stress ECG was non-diagnostic. due to LBBB.  ------------------------------------------------------------------- Baseline: Peak aortic gradient 28 mm Hg, mean aortic gradient 17 mm Hg, dimensionless index 0.44  .  Dobutamine 5 ug/kg/min: Peak aortic gradient 45 mm Hg, mean aortic gradient 28 mm Hg, dimensionless index 0.40. Dobutamine 20 ug/kg/min: Peak aortic gradient 61 mm Hg, mean aortic gradient 40 mm Hg, dimensionless index 0.44. Recovery:  ------------------------------------------------------------------- Measurements  Left ventricle Value Reference Stroke volume, 2D 61 ml --------- Stroke volume/bsa, 2D 29 ml/m^2 --------- LV filling time, D, DP 310 ms --------- LV e&', lateral 4.79 cm/s --------- LV E/e&', lateral 14.41 --------- LV e&', medial 3.81 cm/s --------- LV E/e&', medial 18.11 --------- LV e&', average 4.3 cm/s --------- LV E/e&', average 16.05 ---------  LVOT Value Reference LVOT ID, S 17 mm --------- LVOT area 2.27 cm^2 --------- LVOT peak velocity, S 171 cm/s --------- LVOT mean velocity, S 123 cm/s --------- LVOT VTI, S 26.8 cm --------- LVOT peak gradient, S 12 mm Hg ---------  Aortic valve Value Reference Aortic valve peak velocity, S 366 cm/s --------- Aortic valve mean velocity, S 280 cm/s --------- Aortic valve VTI, S 59.3 cm --------- Aortic mean gradient, S 34 mm Hg --------- Aortic peak gradient, S 54 mm Hg --------- VTI ratio,  LVOT/AV 0.45 --------- Aortic valve area, VTI 1.03 cm^2 --------- Aortic valve area/bsa, VTI 0.49 cm^2/m^2 --------- Velocity ratio, peak, LVOT/AV 0.47 --------- Aortic valve area, peak velocity 1.06 cm^2 --------- Aortic valve area/bsa, peak velocity 0.5 cm^2/m^2 --------- Velocity ratio, mean, LVOT/AV 0.44 --------- Aortic valve area, mean velocity 1 cm^2 --------- Aortic valve area/bsa, mean velocity 0.47 cm^2/m^2 ---------  Mitral valve Value Reference Mitral E-wave peak velocity 69 cm/s --------- Mitral A-wave peak velocity 185 cm/s --------- Mitral mean velocity, D 122.37 cm/s --------- Mitral deceleration time (H) 310 ms 150 - 230 Mitral pressure half-time 87 ms --------- Mitral mean gradient, D 7 mm Hg --------- Mitral E/A ratio, peak 0.4 --------- Mitral valve area, PHT,  DP 2.53 cm^2 --------- Mitral valve area/bsa, PHT, DP 1.2 cm^2/m^2 --------- Mitral valve area, LVOT continuity 1.61 cm^2 --------- Mitral valve area/bsa, LVOT 0.76 cm^2/m^2 --------- continuity Mitral annulus VTI, D 38.8 cm ---------  Legend: (L) and (H) mark values outside specified reference range.  ------------------------------------------------------------------- Prepared and Electronically Authenticated by  Sanda Klein, MD 2020-01-23T18:06:35    ADDENDUM REPORT: 06/17/2018 12:48  EXAM: OVER-READ INTERPRETATION CT CHEST  The following report is an over-read performed by radiologist Dr. [facility], [place] on 06/17/2018. This over-read does not include interpretation of cardiac or coronary anatomy or pathology. The cardiac CTA interpretation by the cardiologist is attached.  COMPARISON: 02/28/2018 chest radiograph.  FINDINGS: Please see the separate concurrent chest CT angiogram report for details.  IMPRESSION: Please see the separate concurrent chest CT angiogram report for details.   Electronically Signed By: Ilona Sorrel M.D. On: 06/17/2018 12:48   Addended by Sharyn Blitz, MD on 06/17/2018 12:50 PM    Study Result   CLINICAL DATA: Aortic Stenosis  EXAM: Cardiac TAVR CT  TECHNIQUE: The patient was scanned on a Siemens Force 195 slice scanner. A 120 kV retrospective scan was triggered in the ascending thoracic aorta at 140 HU's. Gantry rotation speed was 250 msecs and collimation was .6 mm. No beta blockade or nitro were given. The 3D data set was reconstructed in 5% intervals of the R-R cycle. Systolic and diastolic phases were analyzed on a dedicated work station using MPR, MIP and VRT modes. The patient received 80 cc of contrast.  FINDINGS: Aortic Valve: Calcified tri leaflet with restricted motion  Aorta: Bovine arch moderate calcific atherosclerosis  Sino-tubular Junction: 24 mm  Ascending Thoracic Aorta: 30 mm  Aortic Arch: 24 mm  Descending Thoracic Aorta: 22 mm  Sinus of Valsalva Measurements:  Non-coronary: 29.4 mm  Right - coronary: 26.5 mm  Left - coronary: 27.9 mm  Coronary Artery Height above Annulus:  Left Main: 10.3 mm above annulus  Right Coronary: 15.6 mm above annulus  Virtual Basal Annulus Measurements:  Maximum / Minimum Diameter: 26.1 mm x 17.9 mm  Perimeter: 71 mm  Area: 350 mm2  Coronary Arteries: Sufficient height above annulus for deployment  Optimum Fluoroscopic Angle for Delivery: LAO 9 Caudal 11 degrees  IMPRESSION: 1. Tri leaflet AV with annular area  350 mm2 suitable for a 23 mm Sapien 3 valve  2. Optimum angiographic angle for deployment LAO 9 Caudal 11 degrees  3. Coronary arteries sufficient height above annulus for deployment  4. Normal aortic root 3.0 cm with bovine arch and moderate calcific atherosclerosis  Jenkins Rouge  Electronically Signed: By: Jenkins Rouge M.D. On: 06/17/2018 11:37       CLINICAL DATA: 73 year old female with severe symptomatic aortic stenosis. Pre-TAVR evaluation.  EXAM: CT ANGIOGRAPHY CHEST, ABDOMEN AND PELVIS  TECHNIQUE: Multidetector CT imaging through the chest, abdomen and pelvis was performed using the standard protocol during bolus administration of intravenous contrast. Multiplanar reconstructed images and MIPs were obtained and reviewed to evaluate the vascular anatomy.  CONTRAST: 120m ISOVUE-370 IOPAMIDOL (ISOVUE-370) INJECTION 76%  COMPARISON: 02/28/2018 chest radiograph. 09/18/2011 CT abdomen/pelvis.  FINDINGS: CTA CHEST FINDINGS  Cardiovascular: Mild to moderate cardiomegaly. No significant pericardial effusion/thickening. Thickening and calcification of the aortic valve and aortic annulus. Three-vessel coronary atherosclerosis. Thick mitral annular calcification. Atherosclerotic nonaneurysmal thoracic aorta. Normal caliber pulmonary arteries. No central pulmonary emboli.  Mediastinum/Nodes: Calcified subcentimeter right thyroid lobe nodule. Mild circumferential wall thickening in the lower thoracic esophagus, not definitely changed  from 09/18/2011 CT. No pathologically enlarged axillary, mediastinal or hilar lymph nodes.  Lungs/Pleura: No pneumothorax. No pleural effusion. No acute consolidative airspace disease or lung masses. 2 scattered small solid pulmonary nodules in the right lung, largest 3 mm in the right middle lobe (series 14/image 70)  Musculoskeletal: No aggressive appearing focal osseous lesions. Mild thoracic  spondylosis.  CTA ABDOMEN AND PELVIS FINDINGS  Hepatobiliary: Liver surface is questionably finely irregular, can not exclude hepatic cirrhosis. No liver masses. Cholecystectomy. Bile ducts are stable and within normal post cholecystectomy limits.  Pancreas: Diffuse fatty infiltration of the pancreas. No pancreatic mass or duct dilation.  Spleen: Normal size. No mass.  Adrenals/Urinary Tract: Normal adrenals. Simple 2.7 cm interpolar left renal cyst. No suspicious renal masses. No hydronephrosis. Normal bladder.  Stomach/Bowel: Status post Nissen fundoplication. Stomach is nondistended and otherwise normal. Normal caliber small bowel with no small bowel wall thickening. Normal appendix. Moderate diffuse colonic diverticulosis, most prominent in the sigmoid colon, with no large bowel wall thickening or significant pericolonic fat stranding.  Vascular/Lymphatic: Atherosclerotic nonaneurysmal abdominal aorta. No pathologically enlarged lymph nodes in the abdomen or pelvis.  Reproductive: Stable mildly enlarged myomatous uterus. No adnexal masses.  Other: No pneumoperitoneum, ascites or focal fluid collection.  Musculoskeletal: No aggressive appearing focal osseous lesions. Moderate lumbar spondylosis.  VASCULAR MEASUREMENTS PERTINENT TO TAVR:  AORTA:  Minimal Aortic Diameter-10.9 x 10.1 mm (infrarenal abdominal aorta on series 13/image 115)  Severity of Aortic Calcification-moderate to severe  RIGHT PELVIS:  Right Common Iliac Artery -  Minimal Diameter-8.8 x 7.4 mm  Tortuosity-mild  Calcification-mild  Right External Iliac Artery -  Minimal Diameter-7.5 x 7.2 mm  Tortuosity-mild  Calcification-none  Right Common Femoral Artery -  Minimal Diameter-8.2 x 6.9 mm  Tortuosity-mild  Calcification-mild-to-moderate  LEFT PELVIS:  Left Common Iliac Artery -  Minimal Diameter-9.6 x 9.4  mm  Tortuosity-mild-to-moderate  Calcification-mild-to-moderate  Left External Iliac Artery -  Minimal Diameter-7.8 x 7.0 mm  Tortuosity-mild-to-moderate  Calcification-none  Left Common Femoral Artery -  Minimal Diameter-8.3 x 6.2 mm  Tortuosity-mild  Calcification-mild-to-moderate  Review of the MIP images confirms the above findings.  IMPRESSION: 1. Vascular findings and measurements pertinent to potential TAVR procedure, as detailed. 2. Thickening and calcification of the aortic valve, compatible with the reported clinical history of severe symptomatic aortic stenosis. 3. Mild-to-moderate cardiomegaly. 4. Three-vessel coronary atherosclerosis. 5. Nonspecific chronic circumferential wall thickening in the lower thoracic esophagus, presumably due to reflux esophagitis, cannot exclude Barrett's esophagus or esophageal neoplasm by CT. 6. Questionable fine liver surface irregularity, can not exclude hepatic cirrhosis. Consider hepatic elastography for further liver fibrosis risk stratification, as clinically warranted. 7. Tiny right pulmonary nodules, largest 3 mm. No follow-up needed if patient is low-risk (and has no known or suspected primary neoplasm). Non-contrast chest CT can be considered in 12 months if patient is high-risk. This recommendation follows the consensus statement: Guidelines for Management of Incidental Pulmonary Nodules Detected on CT Images:From the Fleischner Society 2017; published online before print (10.1148/radiol.4097353299). 8. Moderate diffuse colonic diverticulosis. 9. Aortic Atherosclerosis (ICD10-I70.0).   Electronically Signed By: Ilona Sorrel M.D. On: 06/17/2018 13:28   STS Risk Score:AVR  Risk of Mortality: 1.724%  Renal Failure: 3.161%  Permanent Stroke: 1.766%  Prolonged Ventilation: 8.712%  DSW Infection: 0.093%  Reoperation: 3.758%  Morbidity or Mortality: 13.828%  Short Length of Stay:  25.853% Long Length of Stay: 6.134%   Impression:  This 73 year old woman has stage D, severe, symptomatic aortic stenosis with [place]  Heart Association class II-III symptoms of exertional fatigue and shortness of breath consistent with chronic diastolic congestive heart failure.  Her symptoms have been progressing despite having PCI of a significant right coronary stenosis.  She has never had any anginal symptoms.  I have personally reviewed her 2D echo, dobutamine stress echo, cardiac catheterization, and CTA studies.  Her echocardiogram shows a trileaflet aortic valve with moderate thickening and calcification of the leaflets with some restriction of mobility.  The mean gradient across aortic valve was 26 mmHg with a peak gradient of 48 mmHg suggesting moderate stenosis.  Left ventricular systolic function was normal with grade 1 diastolic dysfunction.  She has a severely calcified mitral annulus with no evidence of mitral stenosis or regurgitation.  Cardiac catheterization showed a 70 to 80% mid RCA stenosis with widely patent left main, LAD, left circumflex.  She was noted to have moderate pulmonary hypertension with a PA pressure of 66/23 with a mean pulmonary capillary wedge pressure of 18.  The mean gradient across aortic valve was 18 mmHg with a peak to peak gradient of 21 mmHg.  This exam would suggest moderate aortic stenosis.  Due to her progressive symptoms she had a dobutamine stress echocardiogram which showed a marked increase in aortic stroke-volume at peak stress consistent with significant myocardial reserve and an increase in the mean aortic gradient from 17 mmHg at baseline to 40 mmHg at peak stress consistent with severe aortic stenosis.  I think she has low gradient, normal EF, severe aortic stenosis.  I think aortic valve replacement is indicated in this patient for relief of her symptoms and to prevent progressive left ventricular deterioration.  She would be a low risk patient  for open surgical aortic valve replacement.  She would rather have transcatheter aortic valve replacement and I think that is a reasonable alternative at her age of 29 years with obesity and diabetes.  Her gated cardiac CTA shows anatomy suitable for transcatheter aortic valve replacement using a Sapien 3 valve.  Her abdominal and pelvic CTA shows adequate pelvic vascular anatomy to allow transfemoral insertion.  The patient and her husband were counseled at length regarding treatment alternatives for management of severe symptomatic aortic stenosis. The risks and benefits of surgical intervention has been discussed in detail. Long-term prognosis with medical therapy was discussed. Alternative approaches such as conventional surgical aortic valve replacement, transcatheter aortic valve replacement, and palliative medical therapy were compared and contrasted at length. This discussion was placed in the context of the patient's own specific clinical presentation and past medical history. All of their questions have been addressed.  Following the decision to proceed with transcatheter aortic valve replacement, a discussion was held regarding what types of management strategies would be attempted intraoperatively in the event of life-threatening complications, including whether or not the patient would be considered a candidate for the use of cardiopulmonary bypass and/or conversion to open sternotomy for attempted surgical intervention. The patient is aware of the fact that transient use of cardiopulmonary bypass may be necessary.  I think she would be a candidate for median sternotomy if needed to address any intraoperative complications.  The patient has been advised of a variety of complications that might develop including but not limited to risks of death, stroke, paravalvular leak, aortic dissection or other major vascular complications, aortic annulus rupture, device embolization, cardiac rupture or  perforation, mitral regurgitation, acute myocardial infarction, arrhythmia, heart block or bradycardia requiring permanent pacemaker placement, congestive heart failure, respiratory failure, renal failure,  pneumonia, infection, other late complications related to structural valve deterioration or migration, or other complications that might ultimately cause a temporary or permanent loss of functional independence or other long term morbidity. The patient provides full informed consent for the procedure as described and all questions were answered.      Plan:  Transfemoral transcatheter aortic valve replacement.      Gaye Pollack, MD

## 2018-07-07 NOTE — Telephone Encounter (Signed)
Patient no showed today's appt. Please advise on how to follow up. A. No follow up necessary. B. Follow up urgent. Contact patient immediately. C. Follow up necessary. Contact patient and schedule visit in ___ days. D. Follow up advised. Contact patient and schedule visit in ____weeks.  Would you like the NS fee to be applied to this visit?

## 2018-07-07 NOTE — Progress Notes (Signed)
PCP - [name], NP Cardiologist -  Dr. Daneen Schick  Chest x-ray - 07/07/2018 EKG - 07/07/2018 Stress Test - 07/11/16 ECHO - 12/09/17 Cardiac Cath - 02/20/18  Sleep Study - denies CPAP - N/A  Fasting Blood Sugar - pt. States she does not check her blood sugar Checks Blood Sugar 0 times a day  Blood Thinner Instructions: Plavix - continue up until DOS Aspirin Instructions: continue up until DOS  Anesthesia review: No  Patient denies shortness of breath, fever, cough and chest pain at PAT appointment  Patient verbalized understanding of instructions that were given to them at the PAT appointment. Patient was also instructed that they will need to review over the PAT instructions again at home before surgery.

## 2018-07-08 ENCOUNTER — Inpatient Hospital Stay (HOSPITAL_COMMUNITY)
Admission: RE | Admit: 2018-07-08 | Discharge: 2018-07-09 | DRG: 267 | Disposition: A | Discharge status: Home / Self Care | Payer: Medicare Other | Attending: Surgery | Admitting: Surgery

## 2018-07-08 ENCOUNTER — Inpatient Hospital Stay (HOSPITAL_COMMUNITY): Payer: Medicare Other

## 2018-07-08 ENCOUNTER — Encounter (HOSPITAL_COMMUNITY): Payer: Self-pay | Admitting: Certified Registered Nurse Anesthetist

## 2018-07-08 ENCOUNTER — Inpatient Hospital Stay (HOSPITAL_COMMUNITY): Payer: Medicare Other | Admitting: Anesthesiology

## 2018-07-08 ENCOUNTER — Encounter (HOSPITAL_COMMUNITY)
Admission: RE | Disposition: A | Discharge status: Home / Self Care | Payer: Self-pay | Source: Home / Self Care | Attending: Surgery

## 2018-07-08 ENCOUNTER — Inpatient Hospital Stay (HOSPITAL_COMMUNITY): Payer: Medicare Other | Admitting: Physician Assistant

## 2018-07-08 ENCOUNTER — Other Ambulatory Visit: Payer: Self-pay

## 2018-07-08 DIAGNOSIS — Z7902 Long term (current) use of antithrombotics/antiplatelets: Secondary | ICD-10-CM | POA: Diagnosis not present

## 2018-07-08 DIAGNOSIS — N281 Cyst of kidney, acquired: Secondary | ICD-10-CM | POA: Diagnosis present

## 2018-07-08 DIAGNOSIS — Z6833 Body mass index (BMI) 33.0-33.9, adult: Secondary | ICD-10-CM

## 2018-07-08 DIAGNOSIS — Z7982 Long term (current) use of aspirin: Secondary | ICD-10-CM

## 2018-07-08 DIAGNOSIS — Z955 Presence of coronary angioplasty implant and graft: Secondary | ICD-10-CM

## 2018-07-08 DIAGNOSIS — K219 Gastro-esophageal reflux disease without esophagitis: Secondary | ICD-10-CM | POA: Diagnosis not present

## 2018-07-08 DIAGNOSIS — I447 Left bundle-branch block, unspecified: Secondary | ICD-10-CM | POA: Diagnosis present

## 2018-07-08 DIAGNOSIS — I340 Nonrheumatic mitral (valve) insufficiency: Secondary | ICD-10-CM | POA: Diagnosis not present

## 2018-07-08 DIAGNOSIS — Z8719 Personal history of other diseases of the digestive system: Secondary | ICD-10-CM

## 2018-07-08 DIAGNOSIS — Z9049 Acquired absence of other specified parts of digestive tract: Secondary | ICD-10-CM

## 2018-07-08 DIAGNOSIS — I10 Essential (primary) hypertension: Secondary | ICD-10-CM | POA: Diagnosis not present

## 2018-07-08 DIAGNOSIS — Z888 Allergy status to other drugs, medicaments and biological substances status: Secondary | ICD-10-CM

## 2018-07-08 DIAGNOSIS — R918 Other nonspecific abnormal finding of lung field: Secondary | ICD-10-CM | POA: Diagnosis present

## 2018-07-08 DIAGNOSIS — Z954 Presence of other heart-valve replacement: Secondary | ICD-10-CM | POA: Diagnosis not present

## 2018-07-08 DIAGNOSIS — Z79899 Other long term (current) drug therapy: Secondary | ICD-10-CM

## 2018-07-08 DIAGNOSIS — E119 Type 2 diabetes mellitus without complications: Secondary | ICD-10-CM | POA: Diagnosis not present

## 2018-07-08 DIAGNOSIS — I2510 Atherosclerotic heart disease of native coronary artery without angina pectoris: Secondary | ICD-10-CM | POA: Diagnosis not present

## 2018-07-08 DIAGNOSIS — Z006 Encounter for examination for normal comparison and control in clinical research program: Secondary | ICD-10-CM | POA: Diagnosis not present

## 2018-07-08 DIAGNOSIS — J9811 Atelectasis: Secondary | ICD-10-CM | POA: Diagnosis not present

## 2018-07-08 DIAGNOSIS — I350 Nonrheumatic aortic (valve) stenosis: Secondary | ICD-10-CM | POA: Diagnosis not present

## 2018-07-08 DIAGNOSIS — Z794 Long term (current) use of insulin: Secondary | ICD-10-CM | POA: Diagnosis not present

## 2018-07-08 DIAGNOSIS — I700 Atherosclerosis of aorta: Secondary | ICD-10-CM | POA: Diagnosis present

## 2018-07-08 DIAGNOSIS — Z79890 Hormone replacement therapy (postmenopausal): Secondary | ICD-10-CM | POA: Diagnosis not present

## 2018-07-08 DIAGNOSIS — E669 Obesity, unspecified: Secondary | ICD-10-CM | POA: Diagnosis present

## 2018-07-08 DIAGNOSIS — Z952 Presence of prosthetic heart valve: Secondary | ICD-10-CM

## 2018-07-08 DIAGNOSIS — IMO0002 Reserved for concepts with insufficient information to code with codable children: Secondary | ICD-10-CM | POA: Insufficient documentation

## 2018-07-08 DIAGNOSIS — K589 Irritable bowel syndrome without diarrhea: Secondary | ICD-10-CM | POA: Diagnosis present

## 2018-07-08 DIAGNOSIS — E039 Hypothyroidism, unspecified: Secondary | ICD-10-CM | POA: Diagnosis present

## 2018-07-08 DIAGNOSIS — D563 Thalassemia minor: Secondary | ICD-10-CM | POA: Diagnosis not present

## 2018-07-08 DIAGNOSIS — E0865 Diabetes mellitus due to underlying condition with hyperglycemia: Secondary | ICD-10-CM | POA: Insufficient documentation

## 2018-07-08 DIAGNOSIS — E785 Hyperlipidemia, unspecified: Secondary | ICD-10-CM | POA: Diagnosis present

## 2018-07-08 DIAGNOSIS — I272 Pulmonary hypertension, unspecified: Secondary | ICD-10-CM | POA: Diagnosis not present

## 2018-07-08 DIAGNOSIS — K228 Other specified diseases of esophagus: Secondary | ICD-10-CM

## 2018-07-08 DIAGNOSIS — K2289 Other specified disease of esophagus: Secondary | ICD-10-CM

## 2018-07-08 HISTORY — PX: TEE WITHOUT CARDIOVERSION: SHX5443

## 2018-07-08 HISTORY — DX: Other specified diseases of esophagus: K22.8

## 2018-07-08 HISTORY — DX: Presence of prosthetic heart valve: Z95.2

## 2018-07-08 HISTORY — DX: Other specified disease of esophagus: K22.89

## 2018-07-08 HISTORY — DX: Nonrheumatic aortic (valve) stenosis: I35.0

## 2018-07-08 HISTORY — DX: Other nonspecific abnormal finding of lung field: R91.8

## 2018-07-08 HISTORY — PX: TRANSCATHETER AORTIC VALVE REPLACEMENT, TRANSFEMORAL: SHX6400

## 2018-07-08 LAB — POCT I-STAT 7, (LYTES, BLD GAS, ICA,H+H)
Acid-base deficit: 1 mmol/L (ref 0.0–2.0)
Bicarbonate: 24.5 mmol/L (ref 20.0–28.0)
Calcium, Ion: 1.29 mmol/L (ref 1.15–1.40)
HCT: 31 % — ABNORMAL LOW (ref 36.0–46.0)
Hemoglobin: 10.5 g/dL — ABNORMAL LOW (ref 12.0–15.0)
O2 Saturation: 97 %
Potassium: 3.8 mmol/L (ref 3.5–5.1)
Sodium: 140 mmol/L (ref 135–145)
TCO2: 26 mmol/L (ref 22–32)
pCO2 arterial: 43.8 mmHg (ref 32.0–48.0)
pH, Arterial: 7.356 (ref 7.350–7.450)
pO2, Arterial: 95 mmHg (ref 83.0–108.0)

## 2018-07-08 LAB — POCT I-STAT 4, (NA,K, GLUC, HGB,HCT)
Glucose, Bld: 124 mg/dL — ABNORMAL HIGH (ref 70–99)
HCT: 33 % — ABNORMAL LOW (ref 36.0–46.0)
Hemoglobin: 11.2 g/dL — ABNORMAL LOW (ref 12.0–15.0)
Potassium: 3.6 mmol/L (ref 3.5–5.1)
Sodium: 141 mmol/L (ref 135–145)

## 2018-07-08 LAB — BLOOD GAS, ARTERIAL
Acid-Base Excess: 0.6 mmol/L (ref 0.0–2.0)
Bicarbonate: 24.4 mmol/L (ref 20.0–28.0)
Drawn by: 470591
FIO2: 21
O2 Saturation: 94.5 %
Patient temperature: 98.6
pCO2 arterial: 37.3 mmHg (ref 32.0–48.0)
pH, Arterial: 7.431 (ref 7.350–7.450)
pO2, Arterial: 88.7 mmHg (ref 83.0–108.0)

## 2018-07-08 LAB — GLUCOSE, CAPILLARY
Glucose-Capillary: 168 mg/dL — ABNORMAL HIGH (ref 70–99)
Glucose-Capillary: 140 mg/dL — ABNORMAL HIGH (ref 70–99)
Glucose-Capillary: 123 mg/dL — ABNORMAL HIGH (ref 70–99)
Glucose-Capillary: 124 mg/dL — ABNORMAL HIGH (ref 70–99)
Glucose-Capillary: 155 mg/dL — ABNORMAL HIGH (ref 70–99)
Glucose-Capillary: 314 mg/dL — ABNORMAL HIGH (ref 70–99)

## 2018-07-08 LAB — POCT I-STAT CREATININE: Creatinine, Ser: 0.6 mg/dL (ref 0.44–1.00)

## 2018-07-08 SURGERY — IMPLANTATION, AORTIC VALVE, TRANSCATHETER, FEMORAL APPROACH
Anesthesia: Monitor Anesthesia Care | Site: Esophagus

## 2018-07-08 MED ORDER — CHLORHEXIDINE GLUCONATE 4 % EX LIQD: 60.0000 mL | Freq: Once | CUTANEOUS | Status: DC

## 2018-07-08 MED ORDER — SODIUM CHLORIDE 0.9% FLUSH
3.0000 mL | Freq: Two times a day (BID) | INTRAVENOUS | Status: DC
  Administered 2018-07-08 – 2018-07-09 (×2): 3 mL via INTRAVENOUS

## 2018-07-08 MED ORDER — OXYCODONE HCL 5 MG PO TABS: 5.0000 mg | ORAL_TABLET | ORAL | Status: DC | PRN

## 2018-07-08 MED ORDER — ESCITALOPRAM OXALATE 20 MG PO TABS
20.0000 mg | ORAL_TABLET | Freq: Every day | ORAL | Status: DC
  Administered 2018-07-08: 20 mg via ORAL
  Filled 2018-07-08 (×2): qty 1

## 2018-07-08 MED ORDER — PROTAMINE SULFATE 10 MG/ML IV SOLN
INTRAVENOUS | Status: DC | PRN
  Administered 2018-07-08 (×4): 30 mg via INTRAVENOUS

## 2018-07-08 MED ORDER — ACETAMINOPHEN 500 MG PO TABS
ORAL_TABLET | ORAL | Status: AC
  Administered 2018-07-08: 1000 mg
  Filled 2018-07-08: qty 2

## 2018-07-08 MED ORDER — LEVOTHYROXINE SODIUM 175 MCG PO TABS
175.0000 ug | ORAL_TABLET | Freq: Every day | ORAL | Status: DC
  Administered 2018-07-09: 175 ug via ORAL
  Filled 2018-07-08: qty 1

## 2018-07-08 MED ORDER — CHLORHEXIDINE GLUCONATE 4 % EX LIQD: 30.0000 mL | CUTANEOUS | Status: DC

## 2018-07-08 MED ORDER — MIDAZOLAM HCL 2 MG/2ML IJ SOLN
INTRAMUSCULAR | Status: DC | PRN
  Administered 2018-07-08: 1 mg via INTRAVENOUS

## 2018-07-08 MED ORDER — CHLORHEXIDINE GLUCONATE 0.12 % MT SOLN
15.0000 mL | Freq: Once | OROMUCOSAL | Status: AC
  Administered 2018-07-08: 15 mL via OROMUCOSAL

## 2018-07-08 MED ORDER — TRAMADOL HCL 50 MG PO TABS: 50.0000 mg | ORAL_TABLET | ORAL | Status: DC | PRN

## 2018-07-08 MED ORDER — MORPHINE SULFATE (PF) 10 MG/ML IV SOLN: 1.0000 mg | INTRAVENOUS | Status: DC | PRN

## 2018-07-08 MED ORDER — INSULIN ASPART 100 UNIT/ML ~~LOC~~ SOLN
0.0000 [IU] | Freq: Three times a day (TID) | SUBCUTANEOUS | Status: DC
  Administered 2018-07-08: 16 [IU] via SUBCUTANEOUS
  Administered 2018-07-08: 2 [IU] via SUBCUTANEOUS
  Administered 2018-07-09 (×2): 8 [IU] via SUBCUTANEOUS

## 2018-07-08 MED ORDER — FENTANYL CITRATE (PF) 100 MCG/2ML IJ SOLN
INTRAMUSCULAR | Status: DC | PRN
  Administered 2018-07-08: 50 ug via INTRAVENOUS

## 2018-07-08 MED ORDER — IODIXANOL 320 MG/ML IV SOLN
INTRAVENOUS | Status: DC | PRN
  Administered 2018-07-08: 43.1 mL via INTRAVENOUS

## 2018-07-08 MED ORDER — LACTATED RINGERS IV SOLN
INTRAVENOUS | Status: DC
  Administered 2018-07-08 (×2): via INTRAVENOUS

## 2018-07-08 MED ORDER — CLOPIDOGREL BISULFATE 75 MG PO TABS
75.0000 mg | ORAL_TABLET | Freq: Every day | ORAL | Status: DC
  Administered 2018-07-09: 75 mg via ORAL
  Filled 2018-07-08: qty 1

## 2018-07-08 MED ORDER — CHLORHEXIDINE GLUCONATE CLOTH 2 % EX PADS: 6.0000 | MEDICATED_PAD | Freq: Every day | CUTANEOUS | Status: DC

## 2018-07-08 MED ORDER — PANTOPRAZOLE SODIUM 40 MG PO TBEC
40.0000 mg | DELAYED_RELEASE_TABLET | Freq: Two times a day (BID) | ORAL | Status: DC
  Administered 2018-07-08 – 2018-07-09 (×3): 40 mg via ORAL
  Filled 2018-07-08 (×3): qty 1

## 2018-07-08 MED ORDER — HEPARIN SODIUM (PORCINE) 1000 UNIT/ML IJ SOLN
INTRAMUSCULAR | Status: AC
  Filled 2018-07-08: qty 1

## 2018-07-08 MED ORDER — SODIUM CHLORIDE 0.9 % IV SOLN
1.5000 g | Freq: Two times a day (BID) | INTRAVENOUS | Status: DC
  Administered 2018-07-08 – 2018-07-09 (×2): 1.5 g via INTRAVENOUS
  Filled 2018-07-08 (×4): qty 1.5

## 2018-07-08 MED ORDER — ASPIRIN EC 81 MG PO TBEC
81.0000 mg | DELAYED_RELEASE_TABLET | Freq: Every day | ORAL | Status: DC
  Administered 2018-07-08 – 2018-07-09 (×2): 81 mg via ORAL
  Filled 2018-07-08 (×2): qty 1

## 2018-07-08 MED ORDER — SODIUM CHLORIDE 0.9 % IV SOLN
INTRAVENOUS | Status: DC | PRN
  Administered 2018-07-08: 20 ug/min via INTRAVENOUS

## 2018-07-08 MED ORDER — PHENYLEPHRINE HCL-NACL 20-0.9 MG/250ML-% IV SOLN
0.0000 ug/min | INTRAVENOUS | Status: DC
  Filled 2018-07-08: qty 250

## 2018-07-08 MED ORDER — VANCOMYCIN HCL IN DEXTROSE 1-5 GM/200ML-% IV SOLN
1000.0000 mg | Freq: Once | INTRAVENOUS | Status: DC
  Filled 2018-07-08: qty 200

## 2018-07-08 MED ORDER — NITROGLYCERIN IN D5W 200-5 MCG/ML-% IV SOLN: 0.0000 ug/min | INTRAVENOUS | Status: DC

## 2018-07-08 MED ORDER — LIDOCAINE HCL 1 % IJ SOLN
INTRAMUSCULAR | Status: DC | PRN
  Administered 2018-07-08: 20 mL

## 2018-07-08 MED ORDER — MUPIROCIN 2 % EX OINT
TOPICAL_OINTMENT | Freq: Two times a day (BID) | CUTANEOUS | Status: DC
  Administered 2018-07-08: 10:00:00 via NASAL
  Filled 2018-07-08: qty 22

## 2018-07-08 MED ORDER — ONDANSETRON HCL 4 MG/2ML IJ SOLN
INTRAMUSCULAR | Status: DC | PRN
  Administered 2018-07-08: 4 mg via INTRAVENOUS

## 2018-07-08 MED ORDER — SODIUM CHLORIDE 0.9 % IV SOLN
INTRAVENOUS | Status: DC
  Administered 2018-07-08 (×2): via INTRAVENOUS

## 2018-07-08 MED ORDER — ACETAMINOPHEN 325 MG PO TABS: 650.0000 mg | ORAL_TABLET | Freq: Four times a day (QID) | ORAL | Status: DC | PRN

## 2018-07-08 MED ORDER — LACTATED RINGERS IV SOLN: INTRAVENOUS | Status: DC

## 2018-07-08 MED ORDER — VANCOMYCIN HCL 1000 MG IV SOLR
1000.0000 mg | Freq: Once | INTRAVENOUS | Status: AC
  Administered 2018-07-08: 1000 mg via INTRAVENOUS
  Filled 2018-07-08: qty 1000

## 2018-07-08 MED ORDER — SODIUM CHLORIDE 0.9% FLUSH: 3.0000 mL | INTRAVENOUS | Status: DC | PRN

## 2018-07-08 MED ORDER — PROTAMINE SULFATE 10 MG/ML IV SOLN
INTRAVENOUS | Status: AC
  Filled 2018-07-08: qty 25

## 2018-07-08 MED ORDER — CHLORHEXIDINE GLUCONATE 0.12 % MT SOLN
OROMUCOSAL | Status: AC
  Administered 2018-07-08: 15 mL via OROMUCOSAL
  Filled 2018-07-08: qty 15

## 2018-07-08 MED ORDER — SODIUM CHLORIDE 0.9 % IV SOLN: INTRAVENOUS | Status: DC

## 2018-07-08 MED ORDER — ACETAMINOPHEN 500 MG PO TABS: 1000.0000 mg | ORAL_TABLET | Freq: Once | ORAL | Status: DC

## 2018-07-08 MED ORDER — ACETAMINOPHEN 650 MG RE SUPP: 650.0000 mg | Freq: Four times a day (QID) | RECTAL | Status: DC | PRN

## 2018-07-08 MED ORDER — NORTRIPTYLINE HCL 25 MG PO CAPS
25.0000 mg | ORAL_CAPSULE | Freq: Every day | ORAL | Status: DC
  Administered 2018-07-08: 25 mg via ORAL
  Filled 2018-07-08 (×2): qty 1

## 2018-07-08 MED ORDER — PROPOFOL 500 MG/50ML IV EMUL
INTRAVENOUS | Status: DC | PRN
  Administered 2018-07-08: 25 ug/kg/min via INTRAVENOUS

## 2018-07-08 MED ORDER — FENTANYL CITRATE (PF) 250 MCG/5ML IJ SOLN
INTRAMUSCULAR | Status: AC
  Filled 2018-07-08: qty 5

## 2018-07-08 MED ORDER — HEPARIN SODIUM (PORCINE) 1000 UNIT/ML IJ SOLN
INTRAMUSCULAR | Status: DC | PRN
  Administered 2018-07-08: 12000 [IU] via INTRAVENOUS

## 2018-07-08 MED ORDER — SODIUM CHLORIDE 0.9 % IV SOLN: 250.0000 mL | INTRAVENOUS | Status: DC | PRN

## 2018-07-08 MED ORDER — MUPIROCIN 2 % EX OINT
1.0000 "application " | TOPICAL_OINTMENT | Freq: Two times a day (BID) | CUTANEOUS | Status: DC
  Administered 2018-07-09 (×2): 1 via NASAL

## 2018-07-08 MED ORDER — SODIUM CHLORIDE 0.9 % IV SOLN
INTRAVENOUS | Status: AC
  Filled 2018-07-08 (×3): qty 1.2

## 2018-07-08 MED ORDER — DEXAMETHASONE SODIUM PHOSPHATE 4 MG/ML IJ SOLN
INTRAMUSCULAR | Status: DC | PRN
  Administered 2018-07-08: 5 mg via INTRAVENOUS

## 2018-07-08 MED ORDER — SODIUM CHLORIDE 0.9 % IV BOLUS: 500.0000 mL | Freq: Once | INTRAVENOUS | Status: DC

## 2018-07-08 MED ORDER — SODIUM CHLORIDE 0.9 % IV SOLN
INTRAVENOUS | Status: DC | PRN
  Administered 2018-07-08: 1500 mL

## 2018-07-08 MED ORDER — ONDANSETRON HCL 4 MG/2ML IJ SOLN: 4.0000 mg | Freq: Four times a day (QID) | INTRAMUSCULAR | Status: DC | PRN

## 2018-07-08 SURGICAL SUPPLY — 92 items
ADH SKN CLS APL DERMABOND .7 (GAUZE/BANDAGES/DRESSINGS) ×2
BAG DECANTER FOR FLEXI CONT (MISCELLANEOUS) IMPLANT
BAG SNAP BAND KOVER 36X36 (MISCELLANEOUS) ×4 IMPLANT
BLADE CLIPPER SURG (BLADE) IMPLANT
BLADE OSCILLATING /SAGITTAL (BLADE) IMPLANT
BLADE STERNUM SYSTEM 6 (BLADE) IMPLANT
BLADE SURG 10 STRL SS (BLADE) ×4 IMPLANT
CABLE ADAPT CONN TEMP 6FT (ADAPTER) ×4 IMPLANT
CANNULA FEM VENOUS REMOTE 22FR (CANNULA) IMPLANT
CANNULA OPTISITE PERFUSION 16F (CANNULA) IMPLANT
CANNULA OPTISITE PERFUSION 18F (CANNULA) IMPLANT
CATH DIAG EXPO 6F VENT PIG 145 (CATHETERS) ×12 IMPLANT
CATH EXPO 5FR AL1 (CATHETERS) IMPLANT
CATH EXTERNAL FEMALE PUREWICK (CATHETERS) IMPLANT
CATH INFINITI 6F AL2 (CATHETERS) ×2 IMPLANT
CATH S G BIP PACING (CATHETERS) ×4 IMPLANT
CHLORAPREP W/TINT 26ML (MISCELLANEOUS) ×4 IMPLANT
CLIP VESOCCLUDE MED 24/CT (CLIP) IMPLANT
CLIP VESOCCLUDE SM WIDE 24/CT (CLIP) IMPLANT
CLOSURE MYNX CONTROL 6F/7F (Vascular Products) ×2 IMPLANT
CONT SPEC 4OZ CLIKSEAL STRL BL (MISCELLANEOUS) ×8 IMPLANT
COVER BACK TABLE 80X110 HD (DRAPES) ×4 IMPLANT
COVER DOME SNAP 22 D (MISCELLANEOUS) IMPLANT
COVER WAND RF STERILE (DRAPES) ×4 IMPLANT
CRADLE DONUT ADULT HEAD (MISCELLANEOUS) ×4 IMPLANT
DECANTER SPIKE VIAL GLASS SM (MISCELLANEOUS) ×4 IMPLANT
DERMABOND ADVANCED (GAUZE/BANDAGES/DRESSINGS) ×2
DERMABOND ADVANCED .7 DNX12 (GAUZE/BANDAGES/DRESSINGS) ×2 IMPLANT
DEVICE CLOSURE PERCLS PRGLD 6F (VASCULAR PRODUCTS) ×4 IMPLANT
DRAPE INCISE IOBAN 66X45 STRL (DRAPES) IMPLANT
DRSG TEGADERM 4X4.75 (GAUZE/BANDAGES/DRESSINGS) ×8 IMPLANT
ELECT CAUTERY BLADE 6.4 (BLADE) IMPLANT
ELECT REM PT RETURN 9FT ADLT (ELECTROSURGICAL) ×8
ELECTRODE REM PT RTRN 9FT ADLT (ELECTROSURGICAL) ×4 IMPLANT
FELT TEFLON 6X6 (MISCELLANEOUS) IMPLANT
FEMORAL VENOUS CANN RAP (CANNULA) IMPLANT
GAUZE SPONGE 4X4 12PLY STRL (GAUZE/BANDAGES/DRESSINGS) ×4 IMPLANT
GLOVE BIO SURGEON STRL SZ7.5 (GLOVE) ×4 IMPLANT
GLOVE BIO SURGEON STRL SZ8 (GLOVE) IMPLANT
GLOVE EUDERMIC 7 POWDERFREE (GLOVE) IMPLANT
GLOVE ORTHO TXT STRL SZ7.5 (GLOVE) IMPLANT
GOWN STRL REUS W/ TWL LRG LVL3 (GOWN DISPOSABLE) IMPLANT
GOWN STRL REUS W/ TWL XL LVL3 (GOWN DISPOSABLE) ×2 IMPLANT
GOWN STRL REUS W/TWL LRG LVL3 (GOWN DISPOSABLE)
GOWN STRL REUS W/TWL XL LVL3 (GOWN DISPOSABLE) ×4
GUIDEWIRE SAFE TJ AMPLATZ EXST (WIRE) ×4 IMPLANT
GUIDEWIRE STRAIGHT .035 260CM (WIRE) ×4 IMPLANT
INSERT FOGARTY SM (MISCELLANEOUS) IMPLANT
KIT BASIN OR (CUSTOM PROCEDURE TRAY) ×4 IMPLANT
KIT DILATOR VASC 18G NDL (KITS) IMPLANT
KIT HEART LEFT (KITS) ×4 IMPLANT
KIT SUCTION CATH 14FR (SUCTIONS) IMPLANT
KIT TURNOVER KIT B (KITS) ×4 IMPLANT
LOOP VESSEL MAXI BLUE (MISCELLANEOUS) IMPLANT
LOOP VESSEL MINI RED (MISCELLANEOUS) IMPLANT
NEEDLE 22X1 1/2 (OR ONLY) (NEEDLE) ×4 IMPLANT
NS IRRIG 1000ML POUR BTL (IV SOLUTION) ×4 IMPLANT
PACK ENDO MINOR (CUSTOM PROCEDURE TRAY) ×4 IMPLANT
PAD ARMBOARD 7.5X6 YLW CONV (MISCELLANEOUS) ×8 IMPLANT
PAD ELECT DEFIB RADIOL ZOLL (MISCELLANEOUS) ×4 IMPLANT
PENCIL BUTTON HOLSTER BLD 10FT (ELECTRODE) IMPLANT
PERCLOSE PROGLIDE 6F (VASCULAR PRODUCTS) ×8
SET MICROPUNCTURE 5F STIFF (MISCELLANEOUS) ×4 IMPLANT
SHEATH BRITE TIP 6FR 35CM (SHEATH) ×4 IMPLANT
SHEATH PINNACLE 6F 10CM (SHEATH) ×4 IMPLANT
SHEATH PINNACLE 8F 10CM (SHEATH) ×4 IMPLANT
SLEEVE REPOSITIONING LENGTH 30 (MISCELLANEOUS) ×4 IMPLANT
SPONGE LAP 4X18 RFD (DISPOSABLE) IMPLANT
STOPCOCK MORSE 400PSI 3WAY (MISCELLANEOUS) ×8 IMPLANT
SUT ETHIBOND X763 2 0 SH 1 (SUTURE) IMPLANT
SUT GORETEX CV 4 TH 22 36 (SUTURE) IMPLANT
SUT GORETEX CV4 TH-18 (SUTURE) IMPLANT
SUT MNCRL AB 3-0 PS2 18 (SUTURE) IMPLANT
SUT PROLENE 5 0 C 1 36 (SUTURE) IMPLANT
SUT PROLENE 6 0 C 1 30 (SUTURE) IMPLANT
SUT SILK  1 MH (SUTURE) ×2
SUT SILK 1 MH (SUTURE) ×2 IMPLANT
SUT VIC AB 2-0 CT1 27 (SUTURE)
SUT VIC AB 2-0 CT1 TAPERPNT 27 (SUTURE) IMPLANT
SUT VIC AB 2-0 CTX 36 (SUTURE) IMPLANT
SUT VIC AB 3-0 SH 8-18 (SUTURE) IMPLANT
SYR 50ML LL SCALE MARK (SYRINGE) ×4 IMPLANT
SYR BULB IRRIGATION 50ML (SYRINGE) IMPLANT
SYR CONTROL 10ML LL (SYRINGE) IMPLANT
SYR MEDRAD MARK V 150ML (SYRINGE) ×4 IMPLANT
TAPE CLOTH SURG 4X10 WHT LF (GAUZE/BANDAGES/DRESSINGS) ×2 IMPLANT
TOWEL GREEN STERILE (TOWEL DISPOSABLE) ×8 IMPLANT
TRANSDUCER W/STOPCOCK (MISCELLANEOUS) ×8 IMPLANT
TRAY FOLEY SLVR 16FR TEMP STAT (SET/KITS/TRAYS/PACK) IMPLANT
URINAL MALE W/LID DISP 1000CC (MISCELLANEOUS) IMPLANT
VALVE HEART TRANSCATH SZ3 23MM (Valve) ×2 IMPLANT
WIRE .035 3MM-J 145CM (WIRE) ×4 IMPLANT

## 2018-07-08 NOTE — Progress Notes (Signed)
Patient interviewed in the preop area. Patient able to confirm name, DOB, procedure, allergies, metal in body, npo status and no pain at this time. Patient has yellow ring on left ring finger that CRNA states "does not come off". Family at bedside.    Leatha Gilding, RN

## 2018-07-08 NOTE — Progress Notes (Signed)
  [place] VALVE TEAM  Patient doing well s/p TAVR. She is hemodynamically stable but BP is soft. Given 500 cc NS bolus. Groin sites stable. ECG with old LBBB but no high grade block. Arterial line discontinued and transfer to 4E. Plan for early ambulation after bedrest completed and hopeful discharge over the next 24-48 hours.   Angelena Form PA-C  [facility]  Pager [phone]

## 2018-07-08 NOTE — CV Procedure (Signed)
[facility]  TAVR OPERATIVE NOTE   Date of Procedure:  07/08/2018  Preoperative Diagnosis: Severe Aortic Stenosis   Postoperative Diagnosis: Same   Procedure:    Transcatheter Aortic Valve Replacement - Transfemoral Approach  Edwards Sapien 3 THV (size 23 mm, model # [id number], serial # [id number])   Co-Surgeons:  Lauree Chandler, MD and Gaye Pollack, MD   Anesthesiologist:  Roanna Banning  Echocardiographer:  Meda Coffee  Pre-operative Echo Findings:  Severe aortic stenosis  Normal left ventricular systolic function  Post-operative Echo Findings:  No paravalvular leak  Normal left ventricular systolic function  BRIEF CLINICAL NOTE AND INDICATIONS FOR SURGERY  73 yo female with history of CAD with prior PCI, LBBB, carotid artery disease, GERD, Grave's disease, HTN, hypothyroidism, irritable bowel syndrome, diabetes mellitus and aortic stenosis who is here today for TAVR. She has been followed by Dr. Tamala Julian for moderate aortic stenosis. Cardiac cath 02/20/18 with severe stenosis in the mid RCA. A drug eluting stent was placed in the RCA on 04/22/18. By cath her mean gradient across the aortic valve was 18 mmHg and the peak gradient was 21 mmHg. AVA estimated at 1.21 cm2. Echo 11/1917 with LVEF=65-70%. The aortic valve leaflets are thickened and calcified with restricted mobility. Mean gradient 26 mmHg, peak gradient 48 mmHg. AVA 1.19 cm2. Dobutamine echo 05/14/18 with elevation of mean gradient to 40 mmHg with dobutamine infusion. She is felt to have true aortic stenosis but the dobutamine study did not confirm severe stenosis. She has been seen in the pulmonary office and has no findings to suggest underlying lung disease. She was seen by Dr. Tamala Julian recently following her RCA PCI and stenting and reported no improvement in her dyspnea and fatigue. She tells me today that she has dyspnea and fatigue with minimal exertion. No chest pain, dizziness, near syncope or syncope. No  LE edema. She had been walking several miles per day but now cannot walk more than 1 block before stopping.   During the course of the patient's preoperative work up they have been evaluated comprehensively by a multidisciplinary team of specialists coordinated through the [facility] in the [facility].  They have been demonstrated to suffer from symptomatic severe aortic stenosis as noted above. The patient has been counseled extensively as to the relative risks and benefits of all options for the treatment of severe aortic stenosis including long term medical therapy, conventional surgery for aortic valve replacement, and transcatheter aortic valve replacement.  The patient has been independently evaluated by Dr. Cyndia Bent with CT surgery and they are felt to be at high risk for conventional surgical aortic valve replacement. The surgeon indicated the patient would be a poor candidate for conventional surgery. Based upon review of all of the patient's preoperative diagnostic tests they are felt to be candidate for transcatheter aortic valve replacement using the transfemoral approach as an alternative to high risk conventional surgery.    Following the decision to proceed with transcatheter aortic valve replacement, a discussion has been held regarding what types of management strategies would be attempted intraoperatively in the event of life-threatening complications, including whether or not the patient would be considered a candidate for the use of cardiopulmonary bypass and/or conversion to open sternotomy for attempted surgical intervention.  The patient has been advised of a variety of complications that might develop peculiar to this approach including but not limited to risks of death, stroke, paravalvular leak, aortic dissection or other major vascular  complications, aortic annulus rupture, device embolization, cardiac rupture or perforation, acute  myocardial infarction, arrhythmia, heart block or bradycardia requiring permanent pacemaker placement, congestive heart failure, respiratory failure, renal failure, pneumonia, infection, other late complications related to structural valve deterioration or migration, or other complications that might ultimately cause a temporary or permanent loss of functional independence or other long term morbidity.  The patient provides full informed consent for the procedure as described and all questions were answered preoperatively.   DETAILS OF THE OPERATIVE PROCEDURE  PREPARATION:   The patient is brought to the operating room on the above mentioned date and central monitoring was established by the anesthesia team including placement of a radial arterial line. The patient is placed in the supine position on the operating table.  Intravenous antibiotics are administered. Conscious sedation is used.   Baseline transthoracic echocardiogram was performed. The patient's chest, abdomen, both groins, and both lower extremities are prepared and draped in a sterile manner. A time out procedure is performed.   PERIPHERAL ACCESS:   Using the modified Seldinger technique, femoral arterial and venous access were obtained with placement of 6 Fr sheaths on the left side.  A pigtail diagnostic catheter was passed through the femoral arterial sheath under fluoroscopic guidance into the aortic root.  A temporary transvenous pacemaker catheter was passed through the femoral venous sheath under fluoroscopic guidance into the right ventricle.  The pacemaker was tested to ensure stable lead placement and pacemaker capture. Aortic root angiography was performed in order to determine the optimal angiographic angle for valve deployment.  TRANSFEMORAL ACCESS:  A micropuncture kit was used to gain access to the right femoral artery using u/s guidance. Position confirmed with angiography. Pre-closure with double ProGlide closure  devices. The patient was heparinized systemically and ACT verified > 250 seconds.    A 14 Fr transfemoral E-sheath was introduced into the right femoral artery after progressively dilating over an Amplatz superstiff wire. An AL-1 catheter was used to direct a straight-tip exchange length wire across the native aortic valve into the left ventricle. This was exchanged out for a pigtail catheter and position was confirmed in the LV apex. Simultaneous LV and Ao pressures were recorded.  The pigtail catheter was then exchanged for an Amplatz Extra-stiff wire in the LV apex.   TRANSCATHETER HEART VALVE DEPLOYMENT:  An Edwards Sapien 3 THV (size 23 mm) was prepared and crimped per manufacturer's guidelines, and the proper orientation of the valve is confirmed on the [employer] delivery system. The valve was advanced through the introducer sheath using normal technique until in an appropriate position in the abdominal aorta beyond the sheath tip. The balloon was then retracted and using the fine-tuning wheel was centered on the valve. The valve was then advanced across the aortic arch using appropriate flexion of the catheter. The valve was carefully positioned across the aortic valve annulus. The Commander catheter was retracted using normal technique. Once final position of the valve has been confirmed by angiographic assessment, the valve is deployed while temporarily holding ventilation and during rapid ventricular pacing to maintain systolic blood pressure < 50 mmHg and pulse pressure < 10 mmHg. The balloon inflation is held for >3 seconds after reaching full deployment volume. Once the balloon has fully deflated the balloon is retracted into the ascending aorta and valve function is assessed using TTE. There is felt to be no paravalvular leak and no central aortic insufficiency.  The patient's hemodynamic recovery following valve deployment is good.  The  deployment balloon and guidewire are both removed.  Echo demostrated acceptable post-procedural gradients, stable mitral valve function, and no AI.   PROCEDURE COMPLETION:  The sheath was then removed and closure devices were completed. Protamine was administered once femoral arterial repair was complete. The temporary pacemaker, pigtail catheters and femoral sheaths were removed with Mynx closure device placed in the artery and manual pressure used for hemostasis in the vein.   The patient tolerated the procedure well and is transported to the surgical intensive care in stable condition. There were no immediate intraoperative complications. All sponge instrument and needle counts are verified correct at completion of the operation.   No blood products were administered during the operation.  The patient received a total of 43.55m of intravenous contrast during the procedure.  CLauree ChandlerMD 07/08/2018 1:52 PM

## 2018-07-08 NOTE — Op Note (Signed)
[facility]   MULTIDISCIPLINARY HEART VALVE TEAM   TAVR OPERATIVE NOTE   Date of Procedure:  07/08/2018  Preoperative Diagnosis: Severe Aortic Stenosis   Postoperative Diagnosis: Same   Procedure:    Transcatheter Aortic Valve Replacement - Percutaneous Right Transfemoral Approach  Edwards Sapien 3 THV (size 23 mm, model # [id number], serial # [id number])   Co-Surgeons:  Gaye Pollack, MD and Lauree Chandler, MD   Anesthesiologist:  Perfecto Kingdom, MD  Echocardiographer:  Ena Dawley, MD  Pre-operative Echo Findings:  Severe aortic stenosis  Normal left ventricular systolic function  Post-operative Echo Findings:  No paravalvular leak  Normal left ventricular systolic function   BRIEF CLINICAL NOTE AND INDICATIONS FOR SURGERY  This 73 year old woman has stage D, severe, symptomatic aortic stenosis with New York Heart Association class II-III symptoms of exertional fatigue and shortness of breath consistent with chronic diastolic congestive heart failure. Her symptoms have been progressing despite having PCI of a significant right coronary stenosis. She has never had any anginal symptoms. I have personally reviewed her 2D echo, dobutamine stress echo, cardiac catheterization, and CTA studies. Her echocardiogram shows a trileaflet aortic valve with moderate thickening and calcification of the leaflets with some restriction of mobility. The mean gradient across aortic valve was 26 mmHg with a peak gradient of 48 mmHg suggesting moderate stenosis. Left ventricular systolic function was normal with grade 1 diastolic dysfunction. She has a severely calcified mitral annulus with no evidence of mitral stenosis or regurgitation. Cardiac catheterization showed a 70 to 80% mid RCA stenosis with widely patent left main, LAD, left circumflex. She was noted to have moderate pulmonary hypertension with a PA pressure of 66/23 with a mean pulmonary capillary wedge  pressure of 18. The mean gradient across aortic valve was 18 mmHg with a peak to peak gradient of 21 mmHg. This exam would suggest moderate aortic stenosis. Due to her progressive symptoms she had a dobutamine stress echocardiogram which showed a marked increase in aortic stroke-volume at peak stress consistent with significant myocardial reserve and an increase in the mean aortic gradient from 17 mmHg at baseline to 40 mmHg at peak stress consistent with severe aortic stenosis. I think she has low gradient, normal EF, severe aortic stenosis. I think aortic valve replacement is indicated in this patient for relief of her symptoms and to prevent progressive left ventricular deterioration. She would be a low risk patient for open surgical aortic valve replacement. She would rather have transcatheter aortic valve replacement and I think that is a reasonable alternative at her age of 73 years with obesity and diabetes. Her gated cardiac CTA shows anatomy suitable for transcatheter aortic valve replacement using a Sapien 3valve. Her abdominal and pelvic CTA shows adequate pelvic vascular anatomy to allow transfemoral insertion.  The patientand her husband werecounseled at length regarding treatment alternatives for management of severe symptomatic aortic stenosis. The risks and benefits of surgical intervention has been discussed in detail. Long-term prognosis with medical therapy was discussed. Alternative approaches such as conventional surgical aortic valve replacement, transcatheter aortic valve replacement, and palliative medical therapy were compared and contrasted at length. This discussion was placed in the context of the patient's own specific clinical presentation and past medical history. All of their questionshavebeen addressed.  Following the decision to proceed with transcatheter aortic valve replacement, a discussion was held regarding what types of management strategies would be  attempted intraoperatively in the event of life-threatening complications, including whether or not the patient would  be considered a candidate for the use of cardiopulmonary bypass and/or conversion to open sternotomy for attempted surgical intervention. The patient is aware of the fact that transient use of cardiopulmonary bypass may be necessary.I think she would be a candidate for median sternotomy if needed to address any intraoperative complications. The patient has been advised of a variety of complications that might develop including but not limited to risks of death, stroke, paravalvular leak, aortic dissection or other major vascular complications, aortic annulus rupture, device embolization, cardiac rupture or perforation, mitral regurgitation, acute myocardial infarction, arrhythmia, heart block or bradycardia requiring permanent pacemaker placement, congestive heart failure, respiratory failure, renal failure, pneumonia, infection, other late complications related to structural valve deterioration or migration, or other complications that might ultimately cause a temporary or permanent loss of functional independence or other long term morbidity. The patient provides full informed consent for the procedure as described and all questions were answered.    DETAILS OF THE OPERATIVE PROCEDURE  PREPARATION:    The patient is brought to the operating room on the above mentioned date and central monitoring was established by the anesthesia team including placement of a central venous line and radial arterial line. The patient is placed in the supine position on the operating table.  Intravenous antibiotics are administered. The patient is monitored closely throughout the procedure under conscious sedation.    Baseline transthoracic echocardiogram was performed. The patient's chest, abdomen, both groins, and both lower extremities are prepared and draped in a sterile manner. A time out procedure  is performed.   PERIPHERAL ACCESS:    Using the modified Seldinger technique, femoral arterial and venous access was obtained with placement of 6 Fr sheaths on the left side.  A pigtail diagnostic catheter was passed through the left arterial sheath under fluoroscopic guidance into the aortic root.  A temporary transvenous pacemaker catheter was passed through the left femoral venous sheath under fluoroscopic guidance into the right ventricle.  The pacemaker was tested to ensure stable lead placement and pacemaker capture. Aortic root angiography was performed in order to determine the optimal angiographic angle for valve deployment.   TRANSFEMORAL ACCESS:   Percutaneous transfemoral access and sheath placement was performed using ultrasound guidance.  The right common femoral artery was cannulated using a micropuncture needle and appropriate location was verified using hand injection angiogram.  A pair of Abbott Perclose percutaneous closure devices were placed and a 6 French sheath replaced into the femoral artery.  The patient was heparinized systemically and ACT verified > 250 seconds.    A 14 Fr transfemoral E-sheath was introduced into the right femoral artery after progressively dilating over an Amplatz superstiff wire. An AL-1 catheter was used to direct a straight-tip exchange length wire across the native aortic valve into the left ventricle. This was exchanged out for a pigtail catheter and position was confirmed in the LV apex. Simultaneous LV and Ao pressures were recorded.  The pigtail catheter was exchanged for an Amplatz Extra-stiff wire in the LV apex.     BALLOON AORTIC VALVULOPLASTY:   Not performed   TRANSCATHETER HEART VALVE DEPLOYMENT:   An Edwards Sapien 3 transcatheter heart valve (size 23 mm, model #[id number], serial #[id number]) was prepared and crimped per manufacturer's guidelines, and the proper orientation of the valve is confirmed on the [employer] delivery  system. The valve was advanced through the introducer sheath using normal technique until in an appropriate position in the abdominal aorta beyond the sheath tip. The balloon  was then retracted and using the fine-tuning wheel was centered on the valve. The valve was then advanced across the aortic arch using appropriate flexion of the catheter. The valve was carefully positioned across the aortic valve annulus. The Commander catheter was retracted using normal technique. Once final position of the valve has been confirmed by angiographic assessment, the valve is deployed while temporarily holding ventilation and during rapid ventricular pacing to maintain systolic blood pressure < 50 mmHg and pulse pressure < 10 mmHg. The balloon inflation is held for >3 seconds after reaching full deployment volume. Once the balloon has fully deflated the balloon is retracted into the ascending aorta and valve function is assessed using echocardiography. There is felt to be no paravalvular leak and no central aortic insufficiency.  The patient's hemodynamic recovery following valve deployment is good.  The deployment balloon and guidewire are both removed.    PROCEDURE COMPLETION:   The sheath was removed and femoral artery closure performed using the previously placed Perclose devices.  Protamine was administered once femoral arterial repair was complete. The temporary pacemaker, pigtail catheters and femoral sheaths were removed with manual pressure used for hemostasis for the left venous sheath and a Mynx closure device used for the left femoral arterial sheath.  The patient tolerated the procedure well and is transported to the surgical intensive care in stable condition. There were no immediate intraoperative complications. All sponge instrument and needle counts are verified correct at completion of the operation.   No blood products were administered during the operation.  The patient received a total of 43.1 mL  of intravenous contrast during the procedure.   Gaye Pollack, MD 07/08/2018

## 2018-07-08 NOTE — Progress Notes (Signed)
Pt received from PACU. Telebox 23 applied/CCMD notified. CHG bath given. Both groin sites level 0. Vitals stable. Pt has +2 x4 pulses. Call bell within reach. Will continue to montior.  Jerald Kief, RN

## 2018-07-08 NOTE — Transfer of Care (Signed)
Immediate Anesthesia Transfer of Care Note  Patient: Breanna Webster  Procedure(s) Performed: TRANSCATHETER AORTIC VALVE REPLACEMENT, TRANSFEMORAL (N/A Chest) TRANSESOPHAGEAL ECHOCARDIOGRAM (TEE) (N/A Esophagus)  Patient Location: Cath Lab  Anesthesia Type:MAC  Level of Consciousness: drowsy  Airway & Oxygen Therapy: Patient Spontanous Breathing and Patient connected to nasal cannula oxygen  Post-op Assessment: Report given to RN, Post -op Vital signs reviewed and stable and Patient moving all extremities  Post vital signs: Reviewed and stable  Last Vitals:  Vitals Value Taken Time  BP 88/36 07/08/2018  2:07 PM  Temp 36.2 C 07/08/2018  2:03 PM  Pulse 56 07/08/2018  2:08 PM  Resp 18 07/08/2018  2:08 PM  SpO2 98 % 07/08/2018  2:08 PM  Vitals shown include unvalidated device data.  Last Pain:  Vitals:   07/08/18 1403  TempSrc: Temporal  PainSc: 0-No pain         Complications: No apparent anesthesia complications

## 2018-07-08 NOTE — Anesthesia Preprocedure Evaluation (Addendum)
Anesthesia Evaluation  Patient identified by MRN, date of birth, ID band Patient awake    Reviewed: Allergy & Precautions, NPO status , Patient's Chart, lab work & pertinent test results, reviewed documented beta blocker date and time   Airway Mallampati: II  TM Distance: >3 FB Neck ROM: Full    Dental  (+) Missing   Pulmonary shortness of breath and with exertion,    Pulmonary exam normal breath sounds clear to auscultation       Cardiovascular hypertension, Pt. on home beta blockers and Pt. on medications + CAD and + Cardiac Stents  + Valvular Problems/Murmurs AS  Rhythm:Regular Rate:Normal + Systolic murmurs ECG: rate 69. Normal sinus rhythm Left bundle branch block   Neuro/Psych Anxiety negative neurological ROS     GI/Hepatic Neg liver ROS, hiatal hernia, GERD  Medicated and Controlled,IBS (irritable bowel syndrome)   Endo/Other  diabetes, Insulin DependentHypothyroidism   Renal/GU negative Renal ROS     Musculoskeletal Chronic lower back pain   Abdominal (+) + obese,   Peds  Hematology  (+) anemia ,   Anesthesia Other Findings Severe Aortic Stenosis  Reproductive/Obstetrics                           Anesthesia Physical Anesthesia Plan  ASA: IV  Anesthesia Plan: MAC   Post-op Pain Management:    Induction: Intravenous  PONV Risk Score and Plan: 2 and Ondansetron, Dexamethasone and Treatment may vary due to age or medical condition  Airway Management Planned: Simple Face Mask  Additional Equipment: Arterial line  Intra-op Plan:   Post-operative Plan:   Informed Consent: I have reviewed the patients History and Physical, chart, labs and discussed the procedure including the risks, benefits and alternatives for the proposed anesthesia with the patient or authorized representative who has indicated his/her understanding and acceptance.     Dental advisory given  Plan  Discussed with: CRNA  Anesthesia Plan Comments:         Anesthesia Quick Evaluation

## 2018-07-08 NOTE — Progress Notes (Signed)
Echocardiogram 2D Echocardiogram has been performed.  Matilde Bash 07/08/2018, 1:24 PM

## 2018-07-08 NOTE — Interval H&P Note (Signed)
History and Physical Interval Note:  07/08/2018 10:25 AM  Breanna Webster  has presented today for surgery, with the diagnosis of Severe Aortic Stenosis.  The various methods of treatment have been discussed with the patient and family. After consideration of risks, benefits and other options for treatment, the patient has consented to  Procedure(s): TRANSCATHETER AORTIC VALVE REPLACEMENT, TRANSFEMORAL (N/A) TRANSESOPHAGEAL ECHOCARDIOGRAM (TEE) (N/A) as a surgical intervention.  The patient's history has been reviewed, patient examined, no change in status, stable for surgery.  I have reviewed the patient's chart and labs.  Questions were answered to the patient's satisfaction.     Gaye Pollack

## 2018-07-08 NOTE — Telephone Encounter (Signed)
Please refer to Dr. Cordelia Pen response

## 2018-07-08 NOTE — Telephone Encounter (Signed)
Patient is hospitalized

## 2018-07-08 NOTE — Progress Notes (Signed)
Left radial a-line removed by Dalbert Batman. Pressure held x 13 min. Site level zero. Dressing applied Patient teaching done.

## 2018-07-08 NOTE — Anesthesia Postprocedure Evaluation (Signed)
Anesthesia Post Note  Patient: DAMEKA YOUNKER  Procedure(s) Performed: TRANSCATHETER AORTIC VALVE REPLACEMENT, TRANSFEMORAL (N/A Chest) TRANSESOPHAGEAL ECHOCARDIOGRAM (TEE) (N/A Esophagus)     Patient location during evaluation: Cath Lab Anesthesia Type: MAC Level of consciousness: awake and alert Pain management: pain level controlled Vital Signs Assessment: post-procedure vital signs reviewed and stable Respiratory status: spontaneous breathing, nonlabored ventilation, respiratory function stable and patient connected to nasal cannula oxygen Cardiovascular status: stable and blood pressure returned to baseline Postop Assessment: no apparent nausea or vomiting Anesthetic complications: no    Last Vitals:  Vitals:   07/08/18 1544 07/08/18 1629  BP: (!) 95/48 (!) 101/50  Pulse: 61 (!) 59  Resp: 15 18  Temp:  36.5 C  SpO2: 94% 92%    Last Pain:  Vitals:   07/08/18 1629  TempSrc: Oral  PainSc: Asleep                 Ryan P Ellender

## 2018-07-09 ENCOUNTER — Other Ambulatory Visit: Payer: Self-pay | Admitting: Physician Assistant

## 2018-07-09 ENCOUNTER — Encounter (HOSPITAL_COMMUNITY): Payer: Self-pay | Admitting: Cardiovascular Disease

## 2018-07-09 ENCOUNTER — Inpatient Hospital Stay (HOSPITAL_COMMUNITY): Payer: Medicare Other

## 2018-07-09 DIAGNOSIS — Z954 Presence of other heart-valve replacement: Secondary | ICD-10-CM

## 2018-07-09 DIAGNOSIS — I350 Nonrheumatic aortic (valve) stenosis: Secondary | ICD-10-CM

## 2018-07-09 DIAGNOSIS — Z952 Presence of prosthetic heart valve: Secondary | ICD-10-CM

## 2018-07-09 DIAGNOSIS — I340 Nonrheumatic mitral (valve) insufficiency: Secondary | ICD-10-CM

## 2018-07-09 LAB — CBC
HCT: 33.3 % — ABNORMAL LOW (ref 36.0–46.0)
Hemoglobin: 10.3 g/dL — ABNORMAL LOW (ref 12.0–15.0)
MCH: 19.5 pg — ABNORMAL LOW (ref 26.0–34.0)
MCHC: 30.9 g/dL (ref 30.0–36.0)
MCV: 63.1 fL — ABNORMAL LOW (ref 80.0–100.0)
Platelets: 178 10*3/uL (ref 150–400)
RBC: 5.28 MIL/uL — ABNORMAL HIGH (ref 3.87–5.11)
RDW: 15 % (ref 11.5–15.5)
WBC: 9.7 10*3/uL (ref 4.0–10.5)
nRBC: 0 % (ref 0.0–0.2)

## 2018-07-09 LAB — BASIC METABOLIC PANEL
Anion gap: 8 (ref 5–15)
BUN: 17 mg/dL (ref 8–23)
CO2: 22 mmol/L (ref 22–32)
Calcium: 9 mg/dL (ref 8.9–10.3)
Chloride: 107 mmol/L (ref 98–111)
Creatinine, Ser: 0.89 mg/dL (ref 0.44–1.00)
GFR calc Af Amer: >60 mL/min (ref >60)
GFR calc non Af Amer: >60 mL/min (ref >60)
Glucose, Bld: 307 mg/dL — ABNORMAL HIGH (ref 70–99)
Potassium: 4.1 mmol/L (ref 3.5–5.1)
Sodium: 137 mmol/L (ref 135–145)

## 2018-07-09 LAB — ECHOCARDIOGRAM COMPLETE
Height: 65 in
Weight: 3403.2 oz

## 2018-07-09 LAB — GLUCOSE, CAPILLARY
Glucose-Capillary: 216 mg/dL — ABNORMAL HIGH (ref 70–99)
Glucose-Capillary: 223 mg/dL — ABNORMAL HIGH (ref 70–99)

## 2018-07-09 MED ORDER — LOSARTAN POTASSIUM 50 MG PO TABS
50.0000 mg | ORAL_TABLET | Freq: Every day | ORAL | Status: DC
  Administered 2018-07-09: 50 mg via ORAL
  Filled 2018-07-09: qty 1

## 2018-07-09 MED ORDER — BISOPROLOL FUMARATE 5 MG PO TABS
5.0000 mg | ORAL_TABLET | Freq: Every day | ORAL | Status: DC
  Administered 2018-07-09: 5 mg via ORAL
  Filled 2018-07-09: qty 1

## 2018-07-09 MED ORDER — HYDROCHLOROTHIAZIDE 12.5 MG PO CAPS
12.5000 mg | ORAL_CAPSULE | Freq: Every day | ORAL | Status: DC
  Administered 2018-07-09: 12.5 mg via ORAL
  Filled 2018-07-09: qty 1

## 2018-07-09 MED FILL — Potassium Chloride Inj 2 mEq/ML: INTRAVENOUS | Qty: 40 | Status: AC

## 2018-07-09 MED FILL — Magnesium Sulfate Inj 50%: INTRAMUSCULAR | Qty: 10 | Status: AC

## 2018-07-09 MED FILL — Heparin Sodium (Porcine) Inj 1000 Unit/ML: INTRAMUSCULAR | Qty: 30 | Status: AC

## 2018-07-09 NOTE — Progress Notes (Signed)
Inpatient Diabetes Program Recommendations  AACE/ADA: New Consensus Statement on Inpatient Glycemic Control (2015)  Target Ranges:  Prepandial:   less than 140 mg/dL      Peak postprandial:   less than 180 mg/dL (1-2 hours)      Critically ill patients:  140 - 180 mg/dL   Lab Results  Component Value Date   GLUCAP 223 (H) 07/09/2018   HGBA1C 9.3 (H) 07/07/2018    Review of Glycemic Control  Diabetes history: DM2 Outpatient Diabetes medications: Novolin N 120 units QAM Current orders for Inpatient glycemic control: Novolog 0-24 units tidwc and hs  Spoke with pt at length regarding her glucose control at home. Pt states she needs affordable insulin and takes Novolin 120 units QAM. Was previously on 100 units QAM approximately 2-3 months ago. Checks blood sugars several times/day. Discussed HgbA1C of 9.3% and importance of reducing to < 8% to reduce risk of long-term complications. Pt states she is interested in looking at other meds/insulin to control blood sugars. "I want to see if there's anything else that I could take to control my blood sugars and also be less expensive than name-brand insulin. Discussed with pt that onset and duration of NPH is likely not covering her blood sugars in the evening and this may contribute to elevated HgbA1C. Instructed pt to speak with endocrinologist about her concerns with taking large amount of insulin. Rarely has hypoglycemia. Encouraged pt to check blood sugars 2 hours after her dinner meal and 4 hours later, prior to bedtime. Record blood sugars on logbook and f/u with endo.  Pt has had no basal insulin while hospitalized. On Novolog 0-24 units tidwc and hs. Blood sugars 216, 223 this morning. Received 8 units bid for correction.  Pt seems motivated to control her blood sugars and would like to achieve better glycemic control. Consider NPH bid dosing or Novolin 70/30 bid with Novolin R correction. Would benefit from splitting insulin dose to achieve  better coverage. Pt is splitting insulin dose already (100 unit injection and 20 units injection). To f/u with endo.  Thank you. Lorenda Peck, RD, LDN, CDE Inpatient Diabetes Coordinator [phone]

## 2018-07-09 NOTE — Discharge Instructions (Signed)
ACTIVITY AND EXERCISE  Daily activity and exercise are an important part of your recovery. People recover at different rates depending on their general health and type of valve procedure.  Most people recovering from TAVR feel better relatively quickly   No lifting, pushing, pulling more than 10 pounds (examples to avoid: groceries, vacuuming, gardening, golfing):             - For one week with a procedure through the groin.             - For six weeks for procedures through the chest wall or neck NOTE: You will typically see one of our providers 7-14 days after your procedure to discuss Edwards the above activities.      DRIVING  Do not drive for until you are seen for follow up and cleared by a provider. Generally, we ask patient to not drive for 1 week after their procedure.  If you have been told by your doctor in the past that you may not drive, you must talk with him/her before you begin driving again.     DRESSING  Groin site: you may leave the clear dressing over the site for up to one week or until it falls off.     HYGIENE  If you had a femoral (leg) procedure, you may take a shower when you return home. After the shower, pat the site dry. Do NOT use powder, oils or lotions in your groin area until the site has completely healed.  If you had a chest procedure, you may shower when you return home unless specifically instructed not to by your discharging practitioner.             - DO NOT scrub incision; pat dry with a towel             - DO NOT apply any lotions, oils, powders to the incision             - No tub baths / swimming for at least 2 weeks.  If you notice any fevers, chills, increased pain, swelling, bleeding or pus, please contact your doctor.   ADDITIONAL INFORMATION  If you are going to have an upcoming dental procedure, please contact our office as you will require antibiotics ahead of time to prevent infection on your heart valve.    If you  have any questions or concerns you can call the structural heart phone during normal business hours 8am-4pm. If you have an urgent need after hours or weekends please call [phone] to talk to the on call provider for general cardiology. If you have an emergency that requires immediate attention, please call 911.    After TAVR Checklist  Check  Test Description   Follow up appointment in 1-2 weeks  You will see our structural heart physician assistant, [name]. Your incision sites will be checked and you will be cleared to drive and resume all normal activities if you are doing well.     1 month echo and follow up  You will have an echo to check on your new heart valve and be seen back in the office by [name]. Many times the echo is not read by your appointment time, but Joellen Jersey will call you later that day or the following day to report your results.   Follow up with your primary cardiologist You will need to be seen by your primary cardiologist in the following 3-6 months after your 1  month appointment in the valve clinic. Often times your Plavix or Aspirin will be discontinued during this time, but this is decided on a case by case basis.    1 year echo and follow up You will have another echo to check on your heart valve after 1 year and be seen back in the office by [name]. This your last structural heart visit.   Bacterial endocarditis prophylaxis  You will have to take antibiotics for the rest of your life before all dental procedures (even teeth cleanings) to protect your heart valve. Antibiotics are also required before some surgeries. Please check with your cardiologist before scheduling any surgeries. Also, please make sure to tell us if you have a penicillin allergy as you will require an alternative antibiotic.

## 2018-07-09 NOTE — Progress Notes (Signed)
1 Day Post-Op Procedure(s) (LRB): TRANSCATHETER AORTIC VALVE REPLACEMENT, TRANSFEMORAL (N/A) TRANSESOPHAGEAL ECHOCARDIOGRAM (TEE) (N/A) Subjective: No complaints. Says she ambulated down the hall this morning and felt somewhat short of breath on the way back but better than preop.  Objective: Vital signs in last 24 hours: Temp:  [97.2 F (36.2 C)-98.3 F (36.8 C)] 98.3 F (36.8 C) (03/18 0737) Pulse Rate:  [0-295] 80 (03/18 0737) Cardiac Rhythm: Normal sinus rhythm;Heart block;Bundle branch block (03/18 0700) Resp:  [12-19] 19 (03/18 0737) BP: (83-147)/(35-77) 147/69 (03/18 0737) SpO2:  [89 %-99 %] 97 % (03/18 0737) Weight:  [94.6 kg-96.5 kg] 96.5 kg (03/18 0422)  Hemodynamic parameters for last 24 hours:    Intake/Output from previous day: 03/17 0701 - 03/18 0700 In: 4210.4 [P.O.:1080; I.V.:1864.3; IV Piggyback:1266.2] Out: 50 [Blood:50] Intake/Output this shift: No intake/output data recorded.  General appearance: alert and cooperative Neurologic: intact Heart: regular rate and rhythm and 2/6 systolic flow murmur along RSB. no diastolic murmur Lungs: clear to auscultation bilaterally Extremities: extremities normal, atraumatic, no cyanosis or edema and pedal pulses palpable bilaterally Wound: groin sites ok  Lab Results: Recent Labs    07/07/18 1630  07/08/18 1410 07/09/18 0233  WBC 7.7  --   --  9.7  HGB 11.0*   < > 10.5* 10.3*  HCT 38.1   < > 31.0* 33.3*  PLT 232  --   --  178   < > = values in this interval not displayed.   BMET:  Recent Labs    07/07/18 1630 07/08/18 1214 07/08/18 1410 07/08/18 1415 07/09/18 0233  NA 135 141 140  --  137  K 4.2 3.6 3.8  --  4.1  CL 103  --   --   --  107  CO2 22  --   --   --  22  GLUCOSE 274* 124*  --   --  307*  BUN 17  --   --   --  17  CREATININE 0.88  --   --  0.60 0.89  CALCIUM 9.9  --   --   --  9.0    PT/INR:  Recent Labs    07/07/18 1630  LABPROT 14.3  INR 1.1   ABG    Component Value Date/Time    PHART 7.356 07/08/2018 1410   HCO3 24.5 07/08/2018 1410   TCO2 26 07/08/2018 1410   ACIDBASEDEF 1.0 07/08/2018 1410   O2SAT 97.0 07/08/2018 1410   CBG (last 3)  Recent Labs    07/08/18 1638 07/08/18 2203 07/09/18 0632  GLUCAP 155* 314* 216*    Assessment/Plan: S/P Procedure(s) (LRB): TRANSCATHETER AORTIC VALVE REPLACEMENT, TRANSFEMORAL (N/A) TRANSESOPHAGEAL ECHOCARDIOGRAM (TEE) (N/A)  POD1 Hemodynamically stable in sinus rhythm. ECG shows chronic LBBB unchanged from preop.  2D echo pending this am.  Continue ambulation and plan home later today.   LOS: 1 day    Gaye Pollack 07/09/2018

## 2018-07-09 NOTE — Progress Notes (Signed)
CARDIAC REHAB PHASE I   PRE:  Rate/Rhythm: 95 SR with LBBB    BP: sitting 160/69    SaO2: 99 RA  MODE:  Ambulation: 470 ft   POST:  Rate/Rhythm: 103 ST    BP: sitting 168/76     SaO2: 98 RA  Pt able to walk hall with supervision assist, no AD needed. She sts she is still SOB, no change from before TAVR. She does say her weight is up 5 lbs. Also she is probably deconditioned and we discussed slowly building up her walking ability at home. She is interested in [facility] and will send referral to [facility]. Discussed restrictions. No diet questions. [place], ACSM 07/09/2018 9:15 AM

## 2018-07-09 NOTE — Discharge Summary (Signed)
[place] VALVE TEAM  Discharge Summary    Patient ID: Breanna Webster MRN: [id number]; DOB: [birth year]  Admit date: 07/08/2018 Discharge date: 07/09/2018  Primary Care Provider: [name], NP  Primary Cardiologist: Sinclair Grooms, MD / Dr. Angelena Form & Dr. Cyndia Bent (TAVR)  Discharge Diagnoses    Principal Problem:   S/P TAVR (transcatheter aortic valve replacement) Active Problems:   Hypothyroidism   Dyslipidemia   Obesity   Diabetes mellitus due to underlying condition, uncontrolled (HCC)   Left bundle branch block   Thalassemia minor   Essential hypertension   Gastroesophageal reflux disease   Severe aortic stenosis   Esophageal thickening   Allergies Allergies  Allergen Reactions   Statins Other (See Comments)    Muscle aches    Diagnostic Studies/Procedures    [facility]  TAVR OPERATIVE NOTE   Date of Procedure:                07/08/2018  Preoperative Diagnosis:      Severe Aortic Stenosis   Postoperative Diagnosis:    Same   Procedure:        Transcatheter Aortic Valve Replacement - Transfemoral Approach             Edwards Sapien 3 THV (size 23 mm, model # [id number], serial # [id number])              Co-Surgeons:                        Lauree Chandler, MD and Gaye Pollack, MD   Anesthesiologist:                  Roanna Banning  Echocardiographer:              Meda Coffee  Pre-operative Echo Findings: ? Severe aortic stenosis ? Normal left ventricular systolic function  Post-operative Echo Findings: ? No paravalvular leak ? Normal left ventricular systolic function _____________    Echo 07/09/18: pending formal read at the time of discharge.    History of Present Illness     Breanna Webster is a 73 y.o. female with a history of CAD s/p DES to mRCA (03/2018), LBBB, GERD, Grave's disease, HTN, hypothyroidism, IBS, DMT2, and paradoxical LFLG AS who presented to [facility] on  07/08/18 for planned TAVR.  She has had progressive exertional fatigue and dyspnea over the past couple years which has progressed over the past 6 months or so.  She has been followed by Dr. Melvyn Novas for her shortness of breath.  She had pulmonary function testing done by Dr. Melvyn Novas in 2018 which showed moderate restriction.  She had a a ventilation/perfusion scan done which showed no evidence of pulmonary embolism and no findings to suggest pulmonary hypertension.  She was evaluated by GI medicine to assess if reflux may be causing her symptoms.  She had a modified barium swallow in July 2019 which showed no signs of reflux and normal swallowing function.  She had an echocardiogram in August 2019 which showed a trileaflet aortic valve with moderate thickening and calcification.  There was some restriction of mobility.  The peak velocity across the valve was 3.46 m/s with a mean gradient of 26 mmHg and a peak gradient of 48 mmHg.  Left ventricular systolic function was normal with ejection fraction of 65 to 70% and grade 1 diastolic dysfunction.  She underwent cardiac catheterization by Dr. Tamala Julian on 02/20/2018  which showed a 70 to 80% mid RCA stenosis.  The LAD, left main, circumflex were widely patent.  There was normal left ventricular systolic function with a normal LVEDP of 15 mmHg.  There is moderately severe pulmonary hypertension with a PA pressure of 66/41 with a pulmonary capillary wedge pressure mean of 18.  The peak to peak gradient across aortic valve was 21 mmHg with a mean gradient of 63mHg suggesting that the aortic stenosis may not be playing a major role in her pulmonary hypertension or shortness of breath.  She subsequently underwent PCI of the RCA on 04/22/2018 but did not have any improvement in her symptoms afterwards.  She continued to have progressive exertional fatigue and shortness of breath.  She never had any chest pain.  She then underwent a dobutamine stress echo on 05/14/2018.  This showed a  mean aortic gradient of 17 mmHg at baseline which increased to 40 mmHg at peak stress with dobutamine 20 mcg/kg/min.  The peak aortic gradient increased to 61 mmHg.  The patient has been evaluated by the multidisciplinary valve team and felt to have severe, symptomatic aortic stenosis and to be a suitable candidate for TAVR, which was set up for 07/08/18.    Hospital Course     Consultants: none  Severe LFLG AS: s/p successful TAVR with a 23 mm Edwards Sapien 3 THV via the TF approach on 07/08/18. Post operative echo completed but pending formal read at the time of discharge. Groin sites are stable. ECG with sinus and old LBBB but no high grade heart block. Continue Asprin and plavix. Plan for 1 week TOC visit. If patient doing well, we will conduct visit over the telephone given Covid 19 pandemic   HTN: BP elevated. All home antihypertensives resumed.   LBBB: this has remained stable  DMT2: treated with SSI here. Resume home regimen at DC  [facility] Continue DAPT with ASA and plavix and BB. She is intolerant to statins.   _____________  Discharge Vitals Blood pressure (!) 150/58, pulse 66, temperature 98.3 F (36.8 C), temperature source Oral, resp. rate 18, height 5' 5"  (1.651 m), weight 96.5 kg, SpO2 99 %.  Filed Weights   07/08/18 0853 07/08/18 1629 07/09/18 0422  Weight: 94.6 kg 94.6 kg 96.5 kg    Labs & Radiologic Studies    CBC Recent Labs    07/07/18 1630  07/08/18 1410 07/09/18 0233  WBC 7.7  --   --  9.7  HGB 11.0*   < > 10.5* 10.3*  HCT 38.1   < > 31.0* 33.3*  MCV 64.7*  --   --  63.1*  PLT 232  --   --  178   < > = values in this interval not displayed.   Basic Metabolic Panel Recent Labs    07/07/18 1630 07/08/18 1214 07/08/18 1410 07/08/18 1415 07/09/18 0233  NA 135 141 140  --  137  K 4.2 3.6 3.8  --  4.1  CL 103  --   --   --  107  CO2 22  --   --   --  22  GLUCOSE 274* 124*  --   --  307*  BUN 17  --   --   --  17    CREATININE 0.88  --   --  0.60 0.89  CALCIUM 9.9  --   --   --  9.0   Liver Function Tests Recent Labs  07/07/18 1630  AST 83*  ALT 80*  ALKPHOS 90  BILITOT 0.6  PROT 7.2  ALBUMIN 3.6   No results for input(s): LIPASE, AMYLASE in the last 72 hours. Cardiac Enzymes No results for input(s): CKTOTAL, CKMB, CKMBINDEX, TROPONINI in the last 72 hours. BNP Invalid input(s): POCBNP D-Dimer No results for input(s): DDIMER in the last 72 hours. Hemoglobin A1C Recent Labs    07/07/18 1630  HGBA1C 9.3*   Fasting Lipid Panel No results for input(s): CHOL, HDL, LDLCALC, TRIG, CHOLHDL, LDLDIRECT in the last 72 hours. Thyroid Function Tests No results for input(s): TSH, T4TOTAL, T3FREE, THYROIDAB in the last 72 hours.  Invalid input(s): FREET3 _____________  Dg Chest 2 View  Result Date: 07/08/2018 CLINICAL DATA:  Preop TAVR EXAM: CHEST - 2 VIEW COMPARISON:  CT chest 06/17/2018 FINDINGS: There is no focal parenchymal opacity. There is no pleural effusion or pneumothorax. There is stable cardiomegaly. There is mitral annular calcification. The osseous structures are unremarkable. IMPRESSION: No active cardiopulmonary disease. Electronically Signed   By: Kathreen Devoid   On: 07/08/2018 08:33   Ct Coronary Morph W/cta Cor Nancy Fetter W/ca W/cm &/or Wo/cm  Addendum Date: 06/17/2018   ADDENDUM REPORT: 06/17/2018 12:48 EXAM: OVER-READ INTERPRETATION  CT CHEST The following report is an over-read performed by radiologist Dr. Samara Snide [facility], [place] on 06/17/2018. This over-read does not include interpretation of cardiac or coronary anatomy or pathology. The cardiac CTA interpretation by the cardiologist is attached. COMPARISON:  02/28/2018 chest radiograph. FINDINGS: Please see the separate concurrent chest CT angiogram report for details. IMPRESSION: Please see the separate concurrent chest CT angiogram report for details. Electronically Signed   By: Ilona Sorrel M.D.   On: 06/17/2018  12:48   Result Date: 06/17/2018 CLINICAL DATA:  Aortic Stenosis EXAM: Cardiac TAVR CT TECHNIQUE: The patient was scanned on a Siemens Force 638 slice scanner. A 120 kV retrospective scan was triggered in the ascending thoracic aorta at 140 HU's. Gantry rotation speed was 250 msecs and collimation was .6 mm. No beta blockade or nitro were given. The 3D data set was reconstructed in 5% intervals of the R-R cycle. Systolic and diastolic phases were analyzed on a dedicated work station using MPR, MIP and VRT modes. The patient received 80 cc of contrast. FINDINGS: Aortic Valve: Calcified tri leaflet with restricted motion Aorta: Bovine arch moderate calcific atherosclerosis Sino-tubular Junction: 24 mm Ascending Thoracic Aorta: 30 mm Aortic Arch: 24 mm Descending Thoracic Aorta: 22 mm Sinus of Valsalva Measurements: Non-coronary: 29.4 mm Right - coronary: 26.5 mm Left -   coronary: 27.9 mm Coronary Artery Height above Annulus: Left Main: 10.3 mm above annulus Right Coronary: 15.6 mm above annulus Virtual Basal Annulus Measurements: Maximum / Minimum Diameter: 26.1 mm x 17.9 mm Perimeter: 71 mm Area: 350 mm2 Coronary Arteries: Sufficient height above annulus for deployment Optimum Fluoroscopic Angle for Delivery: LAO 9 Caudal 11 degrees IMPRESSION: 1. Tri leaflet AV with annular area 350 mm2 suitable for a 23 mm Sapien 3 valve 2. Optimum angiographic angle for deployment LAO 9 Caudal 11 degrees 3.  Coronary arteries sufficient height above annulus for deployment 4. Normal aortic root 3.0 cm with bovine arch and moderate calcific atherosclerosis Jenkins Rouge Electronically Signed: By: Jenkins Rouge M.D. On: 06/17/2018 11:37   Dg Chest Port 1 View  Result Date: 07/08/2018 CLINICAL DATA:  Post TAVR EXAM: PORTABLE CHEST 1 VIEW COMPARISON:  Portable exam 1648 hours compared to 07/07/2018 FINDINGS: Enlargement of cardiac silhouette post interval  TAVR. Atherosclerotic calcification aorta. Mediastinal contours and  pulmonary vascularity normal. Lungs appear emphysematous with minimal bibasilar atelectasis. No acute infiltrate, pleural effusion or pneumothorax. No acute osseous findings. IMPRESSION: Interval TAVR. Minimal bibasilar atelectasis. Electronically Signed   By: Lavonia Dana M.D.   On: 07/08/2018 17:15   Ct Angio Chest Aorta W &/or Wo Contrast  Result Date: 06/17/2018 CLINICAL DATA:  73 year old female with severe symptomatic aortic stenosis. Pre-TAVR evaluation. EXAM: CT ANGIOGRAPHY CHEST, ABDOMEN AND PELVIS TECHNIQUE: Multidetector CT imaging through the chest, abdomen and pelvis was performed using the standard protocol during bolus administration of intravenous contrast. Multiplanar reconstructed images and MIPs were obtained and reviewed to evaluate the vascular anatomy. CONTRAST:  190m ISOVUE-370 IOPAMIDOL (ISOVUE-370) INJECTION 76% COMPARISON:  02/28/2018 chest radiograph. 09/18/2011 CT abdomen/pelvis. FINDINGS: CTA CHEST FINDINGS Cardiovascular: Mild to moderate cardiomegaly. No significant pericardial effusion/thickening. Thickening and calcification of the aortic valve and aortic annulus. Three-vessel coronary atherosclerosis. Thick mitral annular calcification. Atherosclerotic nonaneurysmal thoracic aorta. Normal caliber pulmonary arteries. No central pulmonary emboli. Mediastinum/Nodes: Calcified subcentimeter right thyroid lobe nodule. Mild circumferential wall thickening in the lower thoracic esophagus, not definitely changed from 09/18/2011 CT. No pathologically enlarged axillary, mediastinal or hilar lymph nodes. Lungs/Pleura: No pneumothorax. No pleural effusion. No acute consolidative airspace disease or lung masses. 2 scattered small solid pulmonary nodules in the right lung, largest 3 mm in the right middle lobe (series 14/image 70) Musculoskeletal: No aggressive appearing focal osseous lesions. Mild thoracic spondylosis. CTA ABDOMEN AND PELVIS FINDINGS Hepatobiliary: Liver surface is  questionably finely irregular, can not exclude hepatic cirrhosis. No liver masses. Cholecystectomy. Bile ducts are stable and within normal post cholecystectomy limits. Pancreas: Diffuse fatty infiltration of the pancreas. No pancreatic mass or duct dilation. Spleen: Normal size. No mass. Adrenals/Urinary Tract: Normal adrenals. Simple 2.7 cm interpolar left renal cyst. No suspicious renal masses. No hydronephrosis. Normal bladder. Stomach/Bowel: Status post Nissen fundoplication. Stomach is nondistended and otherwise normal. Normal caliber small bowel with no small bowel wall thickening. Normal appendix. Moderate diffuse colonic diverticulosis, most prominent in the sigmoid colon, with no large bowel wall thickening or significant pericolonic fat stranding. Vascular/Lymphatic: Atherosclerotic nonaneurysmal abdominal aorta. No pathologically enlarged lymph nodes in the abdomen or pelvis. Reproductive: Stable mildly enlarged myomatous uterus. No adnexal masses. Other: No pneumoperitoneum, ascites or focal fluid collection. Musculoskeletal: No aggressive appearing focal osseous lesions. Moderate lumbar spondylosis. VASCULAR MEASUREMENTS PERTINENT TO TAVR: AORTA: Minimal Aortic Diameter-10.9 x 10.1 mm (infrarenal abdominal aorta on series 13/image 115) Severity of Aortic Calcification-moderate to severe RIGHT PELVIS: Right Common Iliac Artery - Minimal Diameter-8.8 x 7.4 mm Tortuosity-mild Calcification-mild Right External Iliac Artery - Minimal Diameter-7.5 x 7.2 mm Tortuosity-mild Calcification-none Right Common Femoral Artery - Minimal Diameter-8.2 x 6.9 mm Tortuosity-mild Calcification-mild-to-moderate LEFT PELVIS: Left Common Iliac Artery - Minimal Diameter-9.6 x 9.4 mm Tortuosity-mild-to-moderate Calcification-mild-to-moderate Left External Iliac Artery - Minimal Diameter-7.8 x 7.0 mm Tortuosity-mild-to-moderate Calcification-none Left Common Femoral Artery - Minimal Diameter-8.3 x 6.2 mm Tortuosity-mild  Calcification-mild-to-moderate Review of the MIP images confirms the above findings. IMPRESSION: 1. Vascular findings and measurements pertinent to potential TAVR procedure, as detailed. 2. Thickening and calcification of the aortic valve, compatible with the reported clinical history of severe symptomatic aortic stenosis. 3. Mild-to-moderate cardiomegaly. 4. Three-vessel coronary atherosclerosis. 5. Nonspecific chronic circumferential wall thickening in the lower thoracic esophagus, presumably due to reflux esophagitis, cannot exclude Barrett's esophagus or esophageal neoplasm by CT. 6. Questionable fine liver surface irregularity, can not exclude hepatic cirrhosis. Consider hepatic elastography for further liver fibrosis  risk stratification, as clinically warranted. 7. Tiny right pulmonary nodules, largest 3 mm. No follow-up needed if patient is low-risk (and has no known or suspected primary neoplasm). Non-contrast chest CT can be considered in 12 months if patient is high-risk. This recommendation follows the consensus statement: Guidelines for Management of Incidental Pulmonary Nodules Detected on CT Images:From the Fleischner Society 2017; published online before print (10.1148/radiol.[id number]). 8. Moderate diffuse colonic diverticulosis. 9. Aortic Atherosclerosis (ICD10-I70.0). Electronically Signed   By: Ilona Sorrel M.D.   On: 06/17/2018 13:28   Vas US Carotid  Result Date: 06/17/2018 Carotid Arterial Duplex Study Indications: Pre TAVR. Performing Technologist: June Leap RDMS, RVT  Examination Guidelines: A complete evaluation includes B-mode imaging, spectral Doppler, color Doppler, and power Doppler as needed of all accessible portions of each vessel. Bilateral testing is considered an integral part of a complete examination. Limited examinations for reoccurring indications may be performed as noted.  Right Carotid Findings: +----------+--------+--------+--------+------------+--------+              PSV cm/s EDV cm/s Stenosis Describe     Comments  +----------+--------+--------+--------+------------+--------+  CCA Prox   90       9                                        +----------+--------+--------+--------+------------+--------+  CCA Distal 76       11                heterogenous           +----------+--------+--------+--------+------------+--------+  ICA Prox   85       15       1-39%    homogeneous            +----------+--------+--------+--------+------------+--------+  ICA Distal 116      23                                       +----------+--------+--------+--------+------------+--------+  ECA        102      4                                        +----------+--------+--------+--------+------------+--------+ +----------+--------+-------+----------------+-------------------+             PSV cm/s EDV cms Describe         Arm Pressure (mmHG)  +----------+--------+-------+----------------+-------------------+  Subclavian 146              Multiphasic, WNL                      +----------+--------+-------+----------------+-------------------+ +---------+--------+--+--------+--+---------+  Vertebral PSV cm/s 50 EDV cm/s 14 Antegrade  +---------+--------+--+--------+--+---------+  Left Carotid Findings: +----------+--------+--------+--------+------------+------------------+             PSV cm/s EDV cm/s Stenosis Describe     Comments            +----------+--------+--------+--------+------------+------------------+  CCA Prox   136      17                                                 +----------+--------+--------+--------+------------+------------------+  CCA Distal 98       22                heterogenous                     +----------+--------+--------+--------+------------+------------------+  ICA Prox   73       23       1-39%                 intimal thickening  +----------+--------+--------+--------+------------+------------------+  ICA Distal 102      22                                                  +----------+--------+--------+--------+------------+------------------+  ECA        79       11                heterogenous                     +----------+--------+--------+--------+------------+------------------+ +----------+--------+--------+----------------+-------------------+  Subclavian PSV cm/s EDV cm/s Describe         Arm Pressure (mmHG)  +----------+--------+--------+----------------+-------------------+             114               Multiphasic, WNL                      +----------+--------+--------+----------------+-------------------+ +---------+--------+--+--------+--+---------+  Vertebral PSV cm/s 54 EDV cm/s 14 Antegrade  +---------+--------+--+--------+--+---------+  Summary: Right Carotid: Velocities in the right ICA are consistent with a 1-39% stenosis. Left Carotid: Velocities in the left ICA are consistent with a 1-39% stenosis.  *See table(s) above for measurements and observations.  Electronically signed by Harold Barban MD on 06/17/2018 at 28:06:38 PM.    Final    Ct Angio Abd/pel W/ And/or W/o  Result Date: 06/17/2018 CLINICAL DATA:  73 year old female with severe symptomatic aortic stenosis. Pre-TAVR evaluation. EXAM: CT ANGIOGRAPHY CHEST, ABDOMEN AND PELVIS TECHNIQUE: Multidetector CT imaging through the chest, abdomen and pelvis was performed using the standard protocol during bolus administration of intravenous contrast. Multiplanar reconstructed images and MIPs were obtained and reviewed to evaluate the vascular anatomy. CONTRAST:  129m ISOVUE-370 IOPAMIDOL (ISOVUE-370) INJECTION 76% COMPARISON:  02/28/2018 chest radiograph. 09/18/2011 CT abdomen/pelvis. FINDINGS: CTA CHEST FINDINGS Cardiovascular: Mild to moderate cardiomegaly. No significant pericardial effusion/thickening. Thickening and calcification of the aortic valve and aortic annulus. Three-vessel coronary atherosclerosis. Thick mitral annular calcification. Atherosclerotic nonaneurysmal thoracic aorta. Normal caliber  pulmonary arteries. No central pulmonary emboli. Mediastinum/Nodes: Calcified subcentimeter right thyroid lobe nodule. Mild circumferential wall thickening in the lower thoracic esophagus, not definitely changed from 09/18/2011 CT. No pathologically enlarged axillary, mediastinal or hilar lymph nodes. Lungs/Pleura: No pneumothorax. No pleural effusion. No acute consolidative airspace disease or lung masses. 2 scattered small solid pulmonary nodules in the right lung, largest 3 mm in the right middle lobe (series 14/image 70) Musculoskeletal: No aggressive appearing focal osseous lesions. Mild thoracic spondylosis. CTA ABDOMEN AND PELVIS FINDINGS Hepatobiliary: Liver surface is questionably finely irregular, can not exclude hepatic cirrhosis. No liver masses. Cholecystectomy. Bile ducts are stable and within normal post cholecystectomy limits. Pancreas: Diffuse fatty infiltration of the pancreas. No pancreatic mass or duct dilation. Spleen: Normal size. No mass. Adrenals/Urinary Tract: Normal adrenals. Simple 2.7 cm interpolar left renal cyst.  No suspicious renal masses. No hydronephrosis. Normal bladder. Stomach/Bowel: Status post Nissen fundoplication. Stomach is nondistended and otherwise normal. Normal caliber small bowel with no small bowel wall thickening. Normal appendix. Moderate diffuse colonic diverticulosis, most prominent in the sigmoid colon, with no large bowel wall thickening or significant pericolonic fat stranding. Vascular/Lymphatic: Atherosclerotic nonaneurysmal abdominal aorta. No pathologically enlarged lymph nodes in the abdomen or pelvis. Reproductive: Stable mildly enlarged myomatous uterus. No adnexal masses. Other: No pneumoperitoneum, ascites or focal fluid collection. Musculoskeletal: No aggressive appearing focal osseous lesions. Moderate lumbar spondylosis. VASCULAR MEASUREMENTS PERTINENT TO TAVR: AORTA: Minimal Aortic Diameter-10.9 x 10.1 mm (infrarenal abdominal aorta on series  13/image 115) Severity of Aortic Calcification-moderate to severe RIGHT PELVIS: Right Common Iliac Artery - Minimal Diameter-8.8 x 7.4 mm Tortuosity-mild Calcification-mild Right External Iliac Artery - Minimal Diameter-7.5 x 7.2 mm Tortuosity-mild Calcification-none Right Common Femoral Artery - Minimal Diameter-8.2 x 6.9 mm Tortuosity-mild Calcification-mild-to-moderate LEFT PELVIS: Left Common Iliac Artery - Minimal Diameter-9.6 x 9.4 mm Tortuosity-mild-to-moderate Calcification-mild-to-moderate Left External Iliac Artery - Minimal Diameter-7.8 x 7.0 mm Tortuosity-mild-to-moderate Calcification-none Left Common Femoral Artery - Minimal Diameter-8.3 x 6.2 mm Tortuosity-mild Calcification-mild-to-moderate Review of the MIP images confirms the above findings. IMPRESSION: 1. Vascular findings and measurements pertinent to potential TAVR procedure, as detailed. 2. Thickening and calcification of the aortic valve, compatible with the reported clinical history of severe symptomatic aortic stenosis. 3. Mild-to-moderate cardiomegaly. 4. Three-vessel coronary atherosclerosis. 5. Nonspecific chronic circumferential wall thickening in the lower thoracic esophagus, presumably due to reflux esophagitis, cannot exclude Barrett's esophagus or esophageal neoplasm by CT. 6. Questionable fine liver surface irregularity, can not exclude hepatic cirrhosis. Consider hepatic elastography for further liver fibrosis risk stratification, as clinically warranted. 7. Tiny right pulmonary nodules, largest 3 mm. No follow-up needed if patient is low-risk (and has no known or suspected primary neoplasm). Non-contrast chest CT can be considered in 12 months if patient is high-risk. This recommendation follows the consensus statement: Guidelines for Management of Incidental Pulmonary Nodules Detected on CT Images:From the Fleischner Society 2017; published online before print (10.1148/[id number]). 8. Moderate diffuse colonic  diverticulosis. 9. Aortic Atherosclerosis (ICD10-I70.0). Electronically Signed   By: Ilona Sorrel M.D.   On: 06/17/2018 13:28   Disposition   Pt is being discharged home today in good condition.  Follow-up Plans & Appointments    Follow-up Information    Eileen Stanford, PA-C. Go on 07/17/2018.   Specialties:  Cardiology, Radiology Why:  @ 12:00 pm. Please arrive at least 10 minutes early. We will call you the day of this appointement to assess if it can be done over the telephone given Covid 19 pandemic  Contact information: [address] [place] [phone]          Discharge Instructions    Amb Referral to Cardiac Rehabilitation   Complete by:  As directed    Diagnosis:  Valve Replacement   Valve:  Aortic Comment - TAVR      Discharge Medications   Allergies as of 07/09/2018      Reactions   Statins Other (See Comments)   Muscle aches      Medication List    TAKE these medications   acetaminophen 500 MG tablet Commonly known as:  TYLENOL Take 1,000 mg by mouth every 6 (six) hours as needed (for pain.).   aspirin 81 MG tablet Take 81 mg by mouth daily.   bisoprolol 5 MG tablet Commonly known as:  ZEBETA TAKE 1 TABLET BY  MOUTH EVERY DAY   Chlor-Trimeton 4 MG tablet Generic drug:  chlorpheniramine Take 4 mg by mouth at bedtime.   cholecalciferol 1000 units tablet Commonly known as:  VITAMIN D Take 1,000 Units by mouth daily.   clopidogrel 75 MG tablet Commonly known as:  PLAVIX Take 1 tablet (75 mg total) by mouth daily with breakfast.   escitalopram 20 MG tablet Commonly known as:  LEXAPRO TAKE 1 TABLET BY MOUTH EVERY DAY   hydrochlorothiazide 12.5 MG capsule Commonly known as:  MICROZIDE Take 12.5 mg by mouth daily.   insulin NPH Human 100 UNIT/ML injection Commonly known as:  NovoLIN N ReliOn Inject 1.2 mLs (120 Units total) into the skin every morning. And syringes 1/day   Insulin Syringe-Needle U-100 31G  X 5/16" 1 ML Misc Commonly known as:  BD Insulin Syringe U/F USE TO INJECT INSULIN ONCE DAILY   losartan 50 MG tablet Commonly known as:  COZAAR TAKE 1 TABLET(50 MG) BY MOUTH DAILY   multivitamin with minerals Tabs tablet Take 1 tablet by mouth daily.   nitroGLYCERIN 0.4 MG SL tablet Commonly known as:  Nitrostat Place 1 tablet (0.4 mg total) under the tongue every 5 (five) minutes as needed for chest pain.   nortriptyline 25 MG capsule Commonly known as:  PAMELOR Take 1 capsule (25 mg total) by mouth at bedtime.   OneTouch Delica Lancets 08X Misc USE TO CHECK BLOOD SUGAR TWICE DAILY   OneTouch Verio test strip Generic drug:  glucose blood USE TO CHECK BLOOD SUGAR TWICE DAILY   pantoprazole 40 MG tablet Commonly known as:  PROTONIX Take 1 tablet (40 mg total) by mouth 2 (two) times daily.   Synthroid 175 MCG tablet Generic drug:  levothyroxine TAKE 1 TABLET BY MOUTH EVERY DAY What changed:    how much to take  when to take this         Outstanding Labs/Studies   none  Duration of Discharge Encounter   Greater than 30 minutes including physician time.  SignedAngelena Form, PA-C 07/09/2018, 1:47 PM [phone]

## 2018-07-10 ENCOUNTER — Other Ambulatory Visit: Payer: Self-pay | Admitting: Physician Assistant

## 2018-07-10 ENCOUNTER — Telehealth: Payer: Self-pay

## 2018-07-10 MED ORDER — MUPIROCIN 2 % EX OINT: 1.0000 "application " | TOPICAL_OINTMENT | Freq: Two times a day (BID) | CUTANEOUS | 0 refills | Status: DC

## 2018-07-10 NOTE — Telephone Encounter (Signed)
Patient contacted regarding discharge from [facility]  on 07/09/2018.  Patient understands to follow up with provider Angelena Form PA-C on 07/17/2018 at 12 PM at [facility] location. Patient understands discharge instructions? yes Patient understands medications and regiment? yes Patient understands to bring all medications to this visit? yes   Overall the pt is doing well but she did have a HA last night and described this as a "cloud over her head".  Her HA is better today. The pt also gained weight in the hospital and today she is up an additional 2 lbs which amounts to a 5 lb weight gain since TAVR.  The pt denies any SOB or swelling. Per [name] PA-C the pt can increase HCTZ 12.21m to two pills per day for 3 days to see if weight improves and then go back to once a day. The pt also asked about treatment for Staph since her surgical PCR was positive prior to TAVR.  The pt is requesting treatment for this and Katie ordered Mupirocin ointment. The pt will contact the office with any additional questions or concerns.

## 2018-07-11 ENCOUNTER — Telehealth (HOSPITAL_COMMUNITY): Payer: Self-pay

## 2018-07-11 NOTE — Telephone Encounter (Signed)
Pt insurance is active and benefits verified through Medicare A/B. Co-pay $0.00, DED $198.00/$198.00 met, out of pocket $0.00/$0.00 met, co-insurance 20%. No pre-authorization required. Passport, 07/11/2018 @ 8:26AM, REF# [id number]  2ndary insurance is active and benefits verified through [facility]. Co-pay $0.00, DED $50.00/$0.00 met, out of pocket $0.00/$0.00 met, co-insurance 20%. No pre-authorization required. William/GHI, 07/21/2018 @ 8:40AM, REF# [id number]  Will contact patient to see if she is interested in the Cardiac Rehab Program. If interested, patient will need to complete follow up appt. Once completed, patient will be contacted for scheduling upon review by the RN Navigator.

## 2018-07-11 NOTE — Telephone Encounter (Signed)
Attempted to contact pt in regards to CR, spoke with pt husband [name] who stated pt was sleep and will have returned our call when she wake up.

## 2018-07-17 ENCOUNTER — Encounter: Payer: Medicare Other | Admitting: Physician Assistant

## 2018-07-18 ENCOUNTER — Encounter: Payer: Self-pay | Admitting: Physician Assistant

## 2018-07-22 ENCOUNTER — Telehealth: Payer: Self-pay | Admitting: Physician Assistant

## 2018-07-22 NOTE — Telephone Encounter (Signed)
Call patient this morning to set up Webex video, per her husband she is still sleeping call her back in a few hours.  Will try again around noon.

## 2018-07-22 NOTE — Telephone Encounter (Signed)
Spoke with Breanna Webster with his wife permission- they could not get the Webex to work on their computer (patient husband states computer is older) - they will need to a telephone call. They would like you to call on the mobile number for the phone visit.

## 2018-07-23 ENCOUNTER — Telehealth (INDEPENDENT_AMBULATORY_CARE_PROVIDER_SITE_OTHER): Payer: Medicare Other | Admitting: Physician Assistant

## 2018-07-23 ENCOUNTER — Other Ambulatory Visit: Payer: Self-pay

## 2018-07-23 ENCOUNTER — Telehealth (HOSPITAL_COMMUNITY): Payer: Self-pay | Admitting: *Deleted

## 2018-07-23 DIAGNOSIS — Z952 Presence of prosthetic heart valve: Secondary | ICD-10-CM

## 2018-07-23 DIAGNOSIS — I10 Essential (primary) hypertension: Secondary | ICD-10-CM

## 2018-07-23 DIAGNOSIS — I2510 Atherosclerotic heart disease of native coronary artery without angina pectoris: Secondary | ICD-10-CM

## 2018-07-23 NOTE — Progress Notes (Signed)
[facility]   MULTIDISCIPLINARY HEART VALVE TEAM   Evaluation Performed:  Follow-up visit  This visit type was conducted due to national recommendations for restrictions regarding the COVID-19 Pandemic (e.g. social distancing).  This format is felt to be most appropriate for this patient at this time.  All issues noted in this document were discussed and addressed.  No physical exam was performed (except for noted visual exam findings with Telehealth visits).  The patient has consented to conduct a Telehealth visit and understands insurance will be billed.   Date:  07/23/2018   ID:  Breanna Webster, DOB 07/23/1945, MRN [id number]  Patient Location:  [place] [address]   Provider location:   [address] [place], [place] [address]  PCP:  Breanna Peng, NP  Cardiologist:  Breanna Grooms, MD / Dr. Angelena Webster & Dr. Cyndia Webster (TAVR)   Chief Complaint:  [facility] s/p TAVR  History of Present Illness:    Breanna Webster is a 73 y.o. 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    The patient does not have symptoms concerning for COVID-19 infection (fever, chills, cough, or new SHORTNESS OF BREATH).   She has had progressive exertional fatigue and dyspnea over the past couple years which has progressed over the past 6 months or so. She has been followed by Dr. Melvyn Webster for her shortness of breath. She had pulmonary function testing done by Dr. Ricarda Webster 2018 which showed moderate restriction.She had a a ventilation/perfusion scan done which showed no evidence of pulmonary embolism and no findings to suggest pulmonary hypertension. She was evaluated by GI medicine to assess if reflux may be causing her symptoms. She had a modified barium swallow in July 2019 which showed no signs of reflux and  normal swallowing function. She had an echocardiogram in August 2019 which showed a trileaflet aortic valve with moderate thickening and calcification. There was some restriction of mobility. The peak velocity across the valve was 3.46 m/s with a mean gradient of 26 mmHg and a peak gradient of 48 mmHg. Left ventricular systolic function was normal with ejection fraction of 65 to 70% and grade 1 diastolic dysfunction. She underwent cardiac catheterization by Dr. Tamala Webster on 02/20/2018 which showed a 70 to 80% mid RCA stenosis. The LAD, left main, circumflex were widely patent. There was normal left ventricular systolic function with a normal LVEDP of 15 mmHg. There is moderately severe pulmonary hypertension with a PA pressure of 66/41 with a pulmonary capillary wedge pressure mean of 18. The peak to peak gradient across aortic valve was 21 mmHg with a mean gradient of 46mHg suggesting that the aortic stenosis may not be playing a major role in her pulmonary hypertension or shortness of breath. She subsequently underwent PCI of the RCA on 04/22/2018 but did not have any improvement in her symptoms afterwards. She continued to have progressive exertional fatigue and shortness of breath. She never had any chest pain. She then underwent a dobutamine stress echo on 05/14/2018. This showed a mean aortic gradient of 17 mmHg at baseline which increased to 40 mmHg at peak stress with dobutamine 20 mcg/kg/min. The peak aortic gradient increased to 61 mmHg.  She was evaluated by the multidisciplinary valve team. Based on her unexplained symptoms, we felt she likely had severe LFLG AS.  She underwent successful TAVR with a 23 mm Edwards Sapien 3 THV via the TF approach on 07/08/18. Post operative echo showed normally functioning TAVR with mean gradient of 14 mm Hg and no PVL. She had an uncomplicated hospital course and was discharged home on POD1. She was continued on aspirin and plavix.   Today she presents for  a telephone visit. She is doing well. She has been trying to keep busy during the quarantine. She has been walking a lot more and eating less and has lost almost 10 lbs. No CP. She still has some SOB that has not really changed since having her TAVR. She is hopeful that it will improve over time. She is mostly limited by back pain. No LE edema, orthopnea or PND. No dizziness or syncope. No blood in stool or urine. No palpitations.   Prior CV studies:   The following studies were reviewed today: TAVR OPERATIVE NOTE   Date of Procedure:07/08/2018  Preoperative Diagnosis:Severe Aortic Stenosis   Postoperative Diagnosis:Same   Procedure:   Transcatheter Aortic Valve Replacement - Transfemoral Approach Edwards Sapien 3 THV (size 27m, model # [id number] serial ##[id number]  Co-Surgeons:Breanna MAngelena Form MD and BGaye Pollack MD   Anesthesiologist:Breanna Webster  Echocardiographer:Breanna Webster  Pre-operative Echo Findings: ? Severe aortic stenosis ? Normalleft ventricular systolic function  Post-operative Echo Findings: ? Noparavalvular leak ? Normalleft ventricular systolic function _____________    Echo 07/09/18: IMPRESSIONS  1. The left ventricle has hyperdynamic systolic function, with an ejection fraction of >65%. The cavity size was normal. There is moderately increased left ventricular wall thickness. Left ventricular diastolic Doppler parameters are consistent with  impaired relaxation.  2. The right ventricle has normal systolic function. The cavity was normal. There is no increase in right ventricular wall thickness.  3. Left atrial size was severely dilated.  4. No stenosis of the aortic valve.  5. The TAVR seems to be functioning normally.  6. The interatrial septum was not assessed.  Past Medical History:  Diagnosis Date  . Arthritis    "back" (04/22/2018)   . CAD (coronary artery disease)    a. 03/2018 s/p PCI/DES to the RCA (3.0x15 Onyx DES).  . Carotid artery stenosis    Mild  . Chronic lower back pain   . Colon polyps   . Diverticulitis   . Diverticulosis   . Esophageal thickening    seen on pre TAVR CT scan, also questionable cirrhosis. MRI recommended. Will refer to GI  . GERD (gastroesophageal reflux disease)   . Grave's disease   . History of colonic polyps 05/22/2017  . History of hiatal hernia   . Hypertension   . Hypothyroidism   . IBS (irritable bowel syndrome)   . Osteopenia   . Pulmonary nodules    seen on pre TAVR CT. likley benign. no follow up recommended if pt low risk.  . S/P TAVR (transcatheter aortic valve replacement)   . Severe aortic stenosis   . Thalassemia minor   . Type II diabetes mellitus ([place]    Past Surgical History:  Procedure Laterality Date  . 2[place]UDY N/A 03/03/2018   Procedure: 2[place]Y;  Surgeon: NMauri Pole MD;  Location: [facility];  Service: Endoscopy;  Laterality: N/A;  . COLONOSCOPY W/ BIOPSIES AND POLYPECTOMY    . CORONARY ANGIOGRAPHY Right 04/21/2018   Procedure: CORONARY ANGIOGRAPHY (CATH LAB);  Surgeon: SBelva Crome MD;  [place];  Service: Cardiovascular;  Laterality: Right;  .  CORONARY STENT INTERVENTION N/A 04/22/2018   Procedure: CORONARY STENT INTERVENTION;  Surgeon: [name], MD;  Location: [place] CV LAB;  Service: Cardiovascular;  Laterality: N/A;  . DILATION AND CURETTAGE OF UTERUS    . ESOPHAGEAL MANOMETRY N/A 03/03/2018   Procedure: ESOPHAGEAL MANOMETRY (EM);  Surgeon: Mauri Pole, MD;  Location: [facility];  Service: Endoscopy;  Laterality: N/A;  . HERNIA REPAIR    . HYSTEROSCOPY     fibroids  . LAPAROSCOPIC CHOLECYSTECTOMY    . LAPAROSCOPY     fibroids  . NISSEN FUNDOPLICATION  8003K  . RIGHT/LEFT HEART CATH AND CORONARY ANGIOGRAPHY N/A 02/20/2018   Procedure: RIGHT/LEFT HEART CATH AND CORONARY  ANGIOGRAPHY;  Surgeon: [name], MD;  Location: [place] CV LAB;  Service: Cardiovascular;  Laterality: N/A;  . TEE WITHOUT CARDIOVERSION N/A 07/08/2018   Procedure: TRANSESOPHAGEAL ECHOCARDIOGRAM (TEE);  Surgeon: Burnell Blanks, MD;  Location: [place];  Service: Open Heart Surgery;  Laterality: N/A;  . TONSILLECTOMY    . TRANSCATHETER AORTIC VALVE REPLACEMENT, TRANSFEMORAL N/A 07/08/2018   Procedure: TRANSCATHETER AORTIC VALVE REPLACEMENT, TRANSFEMORAL;  Surgeon: Burnell Blanks, MD;  Location: [place];  Service: Open Heart Surgery;  Laterality: N/A;     No outpatient medications have been marked as taking for the 07/23/18 encounter (Telemedicine) with Breanna Stanford, PA-C.     Allergies:   Statins   Social History   Tobacco Use  . Smoking status: Never Smoker  . Smokeless tobacco: Never Used  Substance Use Topics  . Alcohol use: No    Alcohol/week: 0.0 standard drinks  . Drug use: Never     Family Hx: The patient's family history includes Headache in an other family member; Healthy in her son; Heart failure in her mother. There is no history of Colon cancer, Pancreatic cancer, Rectal cancer, or Stomach cancer. She was adopted.  ROS:   Please see the history of present illness.    All other systems reviewed and are negative.   Labs/Other Tests and Data Reviewed:    Recent Labs: 08/05/2017: TSH 0.79 02/19/2018: NT-Pro BNP 270 07/07/2018: ALT 80; Breanna Natriuretic Peptide 112.2 07/09/2018: BUN 17; Creatinine, Ser 0.89; Hemoglobin 10.3; Platelets 178; Potassium 4.1; Sodium 137   Recent Lipid Panel Lab Results  Component Value Date/Time   CHOL 159 06/14/2017 08:26 AM   CHOL 156 11/21/2016 11:13 AM   TRIG 123.0 06/14/2017 08:26 AM   HDL 41.20 06/14/2017 08:26 AM   HDL 44 11/21/2016 11:13 AM   CHOLHDL 4 06/14/2017 08:26 AM   LDLCALC 93 06/14/2017 08:26 AM   LDLCALC 91 11/21/2016 11:13 AM   LDLDIRECT 98.0 05/24/2014 01:43 PM    Wt Readings from Last 3  Encounters:  07/09/18 212 lb 11.2 oz (96.5 kg)  07/07/18 208 lb 8.9 oz (94.6 kg)  07/02/18 205 lb (93 kg)     Exam:    There were no vitals filed for this visit.  Not completed as visit conducted over the phone  ASSESSMENT & PLAN:    Severe LFLG AS s/p TAVR:doing okay. She hasn't noticed a big difference in her breathing since having TAVR but hopeful it will improve over time. Groin sites healing well. No s/s worrisome for heart block. I will see her back in May for 1 month follow up (pushed out given Covid 19 pandemic). Continue Aspirin and plavix.. SBE prophylaxis discussed. Amoxicillin will be called in at her next appt.  HTN: continue current regimen  CAD: s/p DES  to mRCA (03/2018). Continue DAPT with ASA and plavix and BB. She is intolerant to statins.    COVID-19 Education: The signs and symptoms of COVID-19 were discussed with the patient and how to seek care for testing (follow up with PCP or arrange E-visit).  The importance of social distancing was discussed today.  Patient Risk:   After full review of this patients clinical status, I feel that they are at least moderate risk at this time.  Time:   Today, I have spent 25 minutes with the patient with telehealth technology discussing post surgical recovery, symptoms and instructions going forward.  The visit was originally a video visit, but due to technical difficulties it was converted to a telephone visit.    Medication Adjustments/Labs and Tests Ordered: Current medicines are reviewed at length with the patient today.  Concerns regarding medicines are outlined above.  Tests Ordered: No orders of the defined types were placed in this encounter.  Medication Changes: No orders of the defined types were placed in this encounter.   Disposition:  In May for f/u and echo  Signed, Breanna Webster, Hershal Coria  07/23/2018 2:08 PM    [address], [place], [place]  [address] Phone: [phone]; Fax: [phone]

## 2018-07-23 NOTE — Telephone Encounter (Signed)
Called and left message for pt regarding Cardiac Rehab referral. Contact information provided. Cherre Huger, BSN Cardiac and Training and development officer

## 2018-07-23 NOTE — Patient Instructions (Signed)
Hello Ms Seat,   It was so nice to talk to you on the phone today. I just wanted to send you a recap of our discussion.   Please continue taking antibiotics prior to any dental work including cleanings (amoxicillin will be called into your pharmacy at next appointment). Continue all your medications including plavix and aspirin.   Your 1 month echo has been rescheduled to May due to Covid 19 pandemic.  All appointment details are attached to this letter in your "after visit summary."  Please call us with any questions or concerns you may have and please stay safe during these uncertain times.  [name]

## 2018-07-25 ENCOUNTER — Encounter: Payer: Self-pay | Admitting: Surgery

## 2018-07-30 ENCOUNTER — Telehealth (HOSPITAL_COMMUNITY): Payer: Self-pay | Admitting: *Deleted

## 2018-07-30 ENCOUNTER — Other Ambulatory Visit (HOSPITAL_COMMUNITY): Payer: Medicare Other

## 2018-07-30 NOTE — Telephone Encounter (Signed)
Pt returned call and left message.  Called pt back and left message in regards to referral and our departmental closure. Breanna Webster, BSN Cardiac and Training and development officer

## 2018-07-31 ENCOUNTER — Ambulatory Visit: Payer: Medicare Other | Admitting: Physician Assistant

## 2018-07-31 ENCOUNTER — Other Ambulatory Visit (HOSPITAL_COMMUNITY): Payer: Medicare Other

## 2018-08-08 ENCOUNTER — Ambulatory Visit: Payer: PRIVATE HEALTH INSURANCE

## 2018-08-26 ENCOUNTER — Other Ambulatory Visit (INDEPENDENT_AMBULATORY_CARE_PROVIDER_SITE_OTHER): Payer: Self-pay | Admitting: Adult Health

## 2018-08-26 DIAGNOSIS — I10 Essential (primary) hypertension: Secondary | ICD-10-CM

## 2018-08-27 ENCOUNTER — Other Ambulatory Visit: Payer: Self-pay

## 2018-08-27 ENCOUNTER — Telehealth: Payer: Self-pay

## 2018-08-27 DIAGNOSIS — Z952 Presence of prosthetic heart valve: Secondary | ICD-10-CM

## 2018-08-27 NOTE — Telephone Encounter (Signed)
Rescheduled 1 month TAVR visit with [name] to this Friday at 1000. The patient agrees to try video visit via her smartphone.       Virtual Visit Pre-Appointment Phone Call  1. Confirm consent - "In the setting of the current Covid19 crisis, you are scheduled for a (phone or video) visit with your provider on (date) at (time).  Just as we do with many in-office visits, in order for you to participate in this visit, we must obtain consent.  If you'd like, I can send this to your mychart (if signed up) or email for you to review.  Otherwise, I can obtain your verbal consent now.  All virtual visits are billed to your insurance company just like a normal visit would be.  By agreeing to a virtual visit, we'd like you to understand that the technology does not allow for your provider to perform an examination, and thus may limit your provider's ability to fully assess your condition. If your provider identifies any concerns that need to be evaluated in person, we will make arrangements to do so.  Finally, though the technology is pretty good, we cannot assure that it will always work on either your or our end, and in the setting of a video visit, we may have to convert it to a phone-only visit.  In either situation, we cannot ensure that we have a secure connection.  Are you willing to proceed?" STAFF: Did the patient verbally acknowledge consent to telehealth visit? Document YES/NO here: YES  TELEPHONE CALL NOTE  LAMARI YOUNGERS has been deemed a candidate for a follow-up tele-health visit to limit community exposure during the Covid-19 pandemic. I spoke with the patient via phone to ensure availability of phone/video source, confirm preferred email & phone number, and discuss instructions and expectations.  I reminded REILLEY VALENTINE to be prepared with any vital sign and/or heart rhythm information that could potentially be obtained via home monitoring, at the time of her visit. I reminded BREALYNN CONTINO to expect a phone call prior to her visit.  Theodoro Parma, RN 08/27/2018 5:01 PM     IF USING DOXIMITY or [email or web address].[email or web address] - The patient will receive a link just prior to their visit by text.

## 2018-08-28 ENCOUNTER — Ambulatory Visit (HOSPITAL_COMMUNITY): Payer: Medicare Other | Attending: Cardiovascular Disease

## 2018-08-28 ENCOUNTER — Other Ambulatory Visit: Payer: Self-pay

## 2018-08-28 ENCOUNTER — Ambulatory Visit: Payer: Medicare Other | Admitting: Physician Assistant

## 2018-08-28 DIAGNOSIS — Z952 Presence of prosthetic heart valve: Secondary | ICD-10-CM | POA: Diagnosis not present

## 2018-08-28 MED ORDER — PERFLUTREN LIPID MICROSPHERE
1.0000 mL | INTRAVENOUS | Status: AC | PRN
  Administered 2018-08-28: 2 mL via INTRAVENOUS

## 2018-08-28 NOTE — Progress Notes (Signed)
[facility]   MULTIDISCIPLINARY HEART VALVE TEAM     Virtual Visit via Video Note   This visit type was conducted due to national recommendations for restrictions regarding the COVID-19 Pandemic (e.g. social distancing) in an effort to limit this patient's exposure and mitigate transmission in our community.  Due to her co-morbid illnesses, this patient is at least at moderate risk for complications without adequate follow up.  This format is felt to be most appropriate for this patient at this time.  All issues noted in this document were discussed and addressed.  A limited physical exam was performed with this format.  Please refer to the patient's chart for her consent to telehealth for [facility].   Evaluation Performed:  Follow-up visit  Date:  08/29/2018   ID:  Breanna Webster, DOB 24-Oct-1945, MRN [id number]  Patient Location: Home Provider Location: Office  PCP:  [name], NP  Cardiologist:  Sinclair Grooms, MD / Dr. Angelena Form & Dr. Cyndia Bent (TAVR) Electrophysiologist:  None   Chief Complaint: 1 month s/p TAVR  History of Present Illness:    Breanna Webster is a 73 y.o. female with CAD s/p DES to [place] (03/2018), LBBB, GERD, Grave's disease, HTN, hypothyroidism, IBS, DMT2, and paradoxical LFLG AS s/p TAVR (07/08/18) who presents via audio/video conferencing for a telehealth visit today.    The patient does not have symptoms concerning for COVID-19 infection (fever, chills, cough, or new shortness of breath).   She has had progressive exertional fatigue and dyspnea over the past couple years which has progressed over the past 6 months or so. She has been followed by Dr. Melvyn Novas for her shortness of breath. She had pulmonary function testing done by Dr. Ricarda Frame 2018 which showed moderate restriction.She had a a ventilation/perfusion scan done which showed no evidence of pulmonary embolism and no findings to suggest pulmonary hypertension. She was evaluated by  GI medicine to assess if reflux may be causing her symptoms. She had a modified barium swallow in July 2019 which showed no signs of reflux and normal swallowing function. She had an echocardiogram in August 2019 which showed a trileaflet aortic valve with moderate thickening and calcification. There was some restriction of mobility. The peak velocity across the valve was 3.46 m/s with a mean gradient of 26 mmHg and a peak gradient of 48 mmHg. Left ventricular systolic function was normal with ejection fraction of 65 to 70% and grade 1 diastolic dysfunction. She underwent cardiac catheterization by Dr. Tamala Julian on 02/20/2018 which showed a 70 to 80% mid RCA stenosis. The LAD, left main, circumflex were widely patent. There was normal left ventricular systolic function with a normal LVEDP of 15 mmHg. There is moderately severe pulmonary hypertension with a PA pressure of 66/41 with a pulmonary capillary wedge pressure mean of 18. The peak to peak gradient across aortic valve was 21 mmHg with a mean gradient of 63mHg suggesting that the aortic stenosis may not be playing a major role in her pulmonary hypertension or shortness of breath. She subsequently underwent PCI of the RCA on 04/22/2018 but did not have any improvement in her symptoms afterwards. She continued to have progressive exertional fatigue and shortness of breath. She never had any chest pain. She then underwent a dobutamine stress echo on 05/14/2018. This showed a mean aortic gradient of 17 mmHg at baseline which increased to 40 mmHg at peak stress with dobutamine 20 mcg/kg/min. The peak aortic gradient increased to 61 mmHg.  She was evaluated by the multidisciplinary valve team. Based on her unexplained symptoms, we felt she likely had severe LFLG AS. She underwent successful TAVR with a90m Edwards Sapien 3 THV via the TF approach on 07/08/18. Post operative echoshowed normally functioning TAVR with mean gradient of 14 mm Hg and no  PVL. She had an uncomplicated hospital course and was discharged home on POD1. She was continued on aspirin and plavix.   Today she presents for follow up. .Breanna Webster chest pain. She still has shortness of breath with exertion. She has chronic fatigue to the point that she can fall asleep while watching TV. She said she was evaluated for sleep apnea and told she did not have it, but she never had a formal sleep study. Her husband says she does snore at night. She is walking everyday and she can walk about 1-2 blocks and then she has to stop to rest due to shortness of breath and fatigue. No LE edema, orthopnea or PND. She recently had an episode where she got dizzy and nauseated when she was getting dressed to go to a doctors appointment. No syncope. No blood in her stool or urine. She is very frustrated that none of the interventions have alleviated her shortness of breath or fatigue. She wants answers and wants to know the next steps.    Past Medical History:  Diagnosis Date  . Arthritis    "back" (04/22/2018)  . CAD (coronary artery disease)    a. 03/2018 s/p PCI/DES to the RCA (3.0x15 Onyx DES).  . Carotid artery stenosis    Mild  . Chronic lower back pain   . Colon polyps   . Diverticulitis   . Diverticulosis   . Esophageal thickening    seen on pre TAVR CT scan, also questionable cirrhosis. MRI recommended. Will refer to GI  . GERD (gastroesophageal reflux disease)   . Grave's disease   . History of colonic polyps 05/22/2017  . History of hiatal hernia   . Hypertension   . Hypothyroidism   . IBS (irritable bowel syndrome)   . Osteopenia   . Pulmonary nodules    seen on pre TAVR CT. likley benign. no follow up recommended if pt low risk.  . S/P TAVR (transcatheter aortic valve replacement)   . Severe aortic stenosis   . Thalassemia minor   . Type II diabetes mellitus ([place]    Past Surgical History:  Procedure Laterality Date  . [place] N/A 03/03/2018   Procedure: 2[place] STUDY;  Surgeon: NMauri Pole MD;  Location: [facility];  Service: Endoscopy;  Laterality: N/A;  . COLONOSCOPY W/ BIOPSIES AND POLYPECTOMY    . CORONARY ANGIOGRAPHY Right 04/21/2018   Procedure: CORONARY ANGIOGRAPHY (CATH LAB);  Surgeon: SBelva Crome MD;  [facility];  Service: Cardiovascular;  Laterality: Right;  . CORONARY STENT INTERVENTION N/A 04/22/2018   Procedure: CORONARY STENT INTERVENTION;  Surgeon: SBelva Crome MD;  [facility];  Service: Cardiovascular;  Laterality: N/A;  . DILATION AND CURETTAGE OF UTERUS    . ESOPHAGEAL MANOMETRY N/A 03/03/2018   Procedure: ESOPHAGEAL MANOMETRY (EM);  Surgeon: NMauri Pole MD;  Location: [facility];  Service: Endoscopy;  Laterality: N/A;  . HERNIA REPAIR    . HYSTEROSCOPY     fibroids  . LAPAROSCOPIC CHOLECYSTECTOMY    . LAPAROSCOPY     fibroids  . NISSEN FUNDOPLICATION  19417E . RIGHT/LEFT HEART CATH AND CORONARY  ANGIOGRAPHY N/A 02/20/2018   Procedure: RIGHT/LEFT HEART CATH AND CORONARY ANGIOGRAPHY;  Surgeon: [name], MD;  Location: [place] [facility] LAB;  Service: Cardiovascular;  Laterality: N/A;  . TEE WITHOUT CARDIOVERSION N/A 07/08/2018   Procedure: TRANSESOPHAGEAL ECHOCARDIOGRAM (TEE);  Surgeon: Burnell Blanks, MD;  Location: [facility];  Service: Open Heart Surgery;  Laterality: N/A;  . TONSILLECTOMY    . TRANSCATHETER AORTIC VALVE REPLACEMENT, TRANSFEMORAL N/A 07/08/2018   Procedure: TRANSCATHETER AORTIC VALVE REPLACEMENT, TRANSFEMORAL;  Surgeon: Burnell Blanks, MD;  Location: [facility];  Service: Open Heart Surgery;  Laterality: N/A;     Current Meds  Medication Sig  . acetaminophen (TYLENOL) 500 MG tablet Take 1,000 mg by mouth every 6 (six) hours as needed (for pain.).  Breanna Kitchen aspirin 81 MG tablet Take 81 mg by mouth daily.    . bisoprolol (ZEBETA) 5 MG tablet TAKE 1 TABLET BY MOUTH EVERY DAY  . chlorpheniramine (CHLOR-TRIMETON) 4 MG tablet Take 4 mg by mouth at  bedtime.   . cholecalciferol (VITAMIN D) 1000 units tablet Take 1,000 Units by mouth daily.  . clopidogrel (PLAVIX) 75 MG tablet Take 1 tablet (75 mg total) by mouth daily with breakfast.  . escitalopram (LEXAPRO) 20 MG tablet TAKE 1 TABLET BY MOUTH EVERY DAY  . hydrochlorothiazide (HYDRODIURIL) 12.5 MG tablet TAKE 1 TABLET BY MOUTH EVERY DAY  . hydrochlorothiazide (MICROZIDE) 12.5 MG capsule Take 12.5 mg by mouth daily.  . insulin NPH Human (NOVOLIN N RELION) 100 UNIT/ML injection Inject 1.2 mLs (120 Units total) into the skin every morning. And syringes 1/day  . Insulin Syringe-Needle U-100 (BD INSULIN SYRINGE U/F) 31G X 5/16" 1 ML MISC USE TO INJECT INSULIN ONCE DAILY  . losartan (COZAAR) 50 MG tablet TAKE 1 TABLET(50 MG) BY MOUTH DAILY  . Multiple Vitamin (MULITIVITAMIN WITH MINERALS) TABS Take 1 tablet by mouth daily.  . mupirocin ointment (BACTROBAN) 2 % Place 1 application into the nose 2 (two) times daily.  . nitroGLYCERIN (NITROSTAT) 0.4 MG SL tablet Place 1 tablet (0.4 mg total) under the tongue every 5 (five) minutes as needed for chest pain.  . nortriptyline (PAMELOR) 25 MG capsule Take 1 capsule (25 mg total) by mouth at bedtime.  [name] DELICA LANCETS 70V MISC USE TO CHECK BLOOD SUGAR TWICE DAILY  . ONETOUCH VERIO test strip USE TO CHECK BLOOD SUGAR TWICE DAILY  . pantoprazole (PROTONIX) 40 MG tablet Take 1 tablet (40 mg total) by mouth 2 (two) times daily.  Breanna Kitchen SYNTHROID 175 MCG tablet TAKE 1 TABLET BY MOUTH EVERY DAY (Patient taking differently: Take 175 mcg by mouth daily before breakfast. )     Allergies:   Statins   Social History   Tobacco Use  . Smoking status: Never Smoker  . Smokeless tobacco: Never Used  Substance Use Topics  . Alcohol use: No    Alcohol/week: 0.0 Webster drinks  . Drug use: Never     Family Hx: The patient's family history includes Headache in an other family member; Healthy in her son; Heart failure in her mother. There is no history of  Colon cancer, Pancreatic cancer, Rectal cancer, or Stomach cancer. She was adopted.  ROS:   Please see the history of present illness.    All other systems reviewed and are negative.   Prior CV studies:   The following studies were reviewed today:  TAVR OPERATIVE NOTE   Date of Procedure:07/08/2018  Preoperative Diagnosis:Severe Aortic Stenosis   Postoperative Diagnosis:Same   Procedure:  Transcatheter Aortic Valve Replacement - Transfemoral Approach Edwards Sapien 3 THV (size 85m, model # [id number] serial #[id number]  Co-Surgeons:Christopher MAngelena Form MD and BGaye Pollack MD   Anesthesiologist:Ellender  Echocardiographer:Nelson  Pre-operative Echo Findings: ? Severe aortic stenosis ? Normalleft ventricular systolic function  Post-operative Echo Findings: ? Noparavalvular leak ? Normalleft ventricular systolic function _____________   Echo 07/09/18: IMPRESSIONS 1. The left ventricle has hyperdynamic systolic function, with an ejection fraction of >65%. The cavity size was normal. There is moderately increased left ventricular wall thickness. Left ventricular diastolic Doppler parameters are consistent with  impaired relaxation. 2. The right ventricle has normal systolic function. The cavity was normal. There is no increase in right ventricular wall thickness. 3. Left atrial size was severely dilated. 4. No stenosis of the aortic valve. 5. The TAVR seems to be functioning normally. 6. The interatrial septum was not assessed.  _____________  Echo 08/28/2018: IMPRESSIONS  1. The left ventricle has normal systolic function with an ejection fraction of 60-65%. The cavity size was normal. Indeterminate diastolic filling due to E-A fusion. Indeterminate filling pressures.  2. The right ventricle has normal systolic function. The cavity was  normal. There is no increase in right ventricular wall thickness.  3. Left atrial size was moderately dilated.  4. The mitral valve is degenerative. Severe thickening of the mitral valve leaflet. Severe calcification of the mitral valve leaflet. There is severe mitral annular calcification present. Mild mitral valve stenosis.  5. Severe MAC and leaflet thickening mild functional stenosis gradients lower than echo 07/09/18.  6. The aortic root is normal in size and structure.  7. - TAVR: 23 mm Sapien 3 in good position with no PVL gradients stable since 07/09/18.  Labs/Other Tests and Data Reviewed:    EKG:  No ECG reviewed.  Recent Labs: 02/19/2018: NT-Pro BNP 270 07/07/2018: ALT 80; B Natriuretic Peptide 112.2 07/09/2018: BUN 17; Creatinine, Ser 0.89; Hemoglobin 10.3; Platelets 178; Potassium 4.1; Sodium 137   Recent Lipid Panel Lab Results  Component Value Date/Time   CHOL 159 06/14/2017 08:26 AM   CHOL 156 11/21/2016 11:13 AM   TRIG 123.0 06/14/2017 08:26 AM   HDL 41.20 06/14/2017 08:26 AM   HDL 44 11/21/2016 11:13 AM   CHOLHDL 4 06/14/2017 08:26 AM   LDLCALC 93 06/14/2017 08:26 AM   LDLCALC 91 11/21/2016 11:13 AM   LDLDIRECT 98.0 05/24/2014 01:43 PM    Wt Readings from Last 3 Encounters:  07/09/18 212 lb 11.2 oz (96.5 kg)  07/07/18 208 lb 8.9 oz (94.6 kg)  07/02/18 205 lb (93 kg)     Objective:    Vital Signs:  There were no vitals taken for this visit.   Well nourished, well developed female in no acute distress.   ASSESSMENT & PLAN:    Severe LFLG AS s/p TAVR:she still has NYHA class II-III symptoms. Echo yesterday shows normal LV function with noramlly functioning TAVR with no PVL; mean gradient 14 mm Hg. SHe will continue on aspirin and plavix. Plavix can be discontinued after 6 months of therapy (12/2018). She understands the need for SBE prophylaxis; she will need amoxicillin but doesn't want it called in now. I will see her back in 1 year for echo and follow up.    Continued dyspnea and fatigue: she has had an extensive work up by cardiology, pulmonary, and GI. She has had no improvement after coronary stenting and now TAVR. She is very frustrated that she continues to be symptomatic with no answers and  would like to know the next step in work up. She does have severe thickening of her mitral valve with MAC but only mild MS, so doubt this is contributing. She does seem to have excessive daytime sleepiness, low energy, and intermittent snoring, so will send in a referral for a sleep study. I will reach out to Dr. Tamala Julian, Dr. Angelena Form and Dr. Melvyn Novas to see if they have any other suggestions.   HTN: she has not taken her BP recently. Continue current medical therapy.   CAD: s/p DES to mRCA (03/2018). Continue DAPT with ASA and plavix, BB. Can stop plavix after 6 months s/p TAVR. Intolerant to statins.   Incidental findings: pre TAVR CT showed chronic circumferential wall thickening in the lower thoracic esophagus, presumably due to reflux esophagitis, cannot exclude Barrett's esophagus or esophageal neoplasm by CT. Questionable fine liver surface irregularity, can not exclude hepatic cirrhosis. Consider hepatic elastography for further liver fibrosis risk stratification, as clinically warranted. She has known GERD and is followed by Dr. Havery Moros w/ GI and due for an EGD. I will defer any further workup of these incidental findings (if any) to him. I have asked her to call his office to make an appointment. She is cleared for surgery from a cardiology perspective.   CT also noted tiny pulmonary nodules in right lung that are felt to be benign. Patient is a lifetime non smoker and no further work up is recommended.   COVID-19 Education: the signs and symptoms of COVID-19 were discussed with the patient and how to seek care for testing (follow up with PCP or arrange E-visit).  The importance of social distancing was discussed today.  Time:   Total time spent with  patient was over 40 minutes which included evaluating patient, reviewing record and coordinating care. Face to face time (via video chat) >50%.    Medication Adjustments/Labs and Tests Ordered: Current medicines are reviewed at length with the patient today.  Concerns regarding medicines are outlined above.   Tests Ordered: No orders of the defined types were placed in this encounter.   Medication Changes: No orders of the defined types were placed in this encounter.   Disposition:  Follow up in 3 month(s)  Signed, Angelena Form, PA-C  08/29/2018 1:02 PM    [place]

## 2018-08-29 ENCOUNTER — Telehealth (INDEPENDENT_AMBULATORY_CARE_PROVIDER_SITE_OTHER): Payer: Medicare Other | Admitting: Physician Assistant

## 2018-08-29 ENCOUNTER — Telehealth: Payer: Self-pay

## 2018-08-29 ENCOUNTER — Other Ambulatory Visit: Payer: Self-pay | Admitting: Physician Assistant

## 2018-08-29 DIAGNOSIS — Z7189 Other specified counseling: Secondary | ICD-10-CM

## 2018-08-29 DIAGNOSIS — I10 Essential (primary) hypertension: Secondary | ICD-10-CM | POA: Diagnosis not present

## 2018-08-29 DIAGNOSIS — R0602 Shortness of breath: Secondary | ICD-10-CM

## 2018-08-29 DIAGNOSIS — I2510 Atherosclerotic heart disease of native coronary artery without angina pectoris: Secondary | ICD-10-CM | POA: Diagnosis not present

## 2018-08-29 DIAGNOSIS — R5383 Other fatigue: Secondary | ICD-10-CM

## 2018-08-29 DIAGNOSIS — Z952 Presence of prosthetic heart valve: Secondary | ICD-10-CM | POA: Diagnosis not present

## 2018-08-29 DIAGNOSIS — G4719 Other hypersomnia: Secondary | ICD-10-CM

## 2018-08-29 NOTE — Telephone Encounter (Signed)
Patient Name: Breanna Webster        DOB: October 10, 2045      Height: 5'5"    Weight: 212 lb 11.2 oz  Office Name:[facility]          Referring Provider: Angelena Form PA-C  Today's Date: 08/29/18  Date:   STOP BANG RISK ASSESSMENT S (snore) Have you been told that you snore?     YES   T (tired) Are you often tired, fatigued, or sleepy during the day?   YES  O (obstruction) Do you stop breathing, choke, or gasp during sleep? NO   P (pressure) Do you have or are you being treated for high blood pressure? YES   B (BMI) Is your body index greater than 35 kg/m? YES   A (age) Are you 77 years old or older? YES   N (neck) Do you have a neck circumference greater than 16 inches?   Unknown   G (gender) Are you a female? NO   TOTAL STOP/BANG "YES" ANSWERS 5                                                                       For Office Use Only              Procedure Order Form    YES to 3+ Stop Bang questions OR two clinical symptoms - patient qualifies for WatchPAT (CPT 95800)     Submit: This Form + Patient Face Sheet + Clinical Note via CloudPAT or Fax: [phone]         Clinical Notes: Will consult Sleep Specialist and refer for management of therapy due to patient increased risk of Sleep Apnea. Ordering a sleep study due to the following two clinical symptoms: Excessive daytime sleepiness G47.10 / Gastroesophageal reflux K21.9 / Nocturia R35.1 / Morning Headaches G44.221 / Difficulty concentrating R41.840 / Memory problems or poor judgment G31.84 / Personality changes or irritability R45.4 / Loud snoring R06.83 / Depression F32.9 / Unrefreshed by sleep G47.8 / Impotence N52.9 / History of high blood pressure R03.0 / Insomnia G47.00

## 2018-08-29 NOTE — Progress Notes (Signed)
Talked with Dr. Radford Pax who advised me to order a home sleep study. Staff message sent to [facility] RN.    Angelena Form PA-C  [facility]

## 2018-08-29 NOTE — Telephone Encounter (Signed)
Received a staff message from [employer] PA-C, she requested a home sleep study through [facility].

## 2018-08-29 NOTE — Telephone Encounter (Signed)
Faxed all the records to [facility].

## 2018-08-29 NOTE — Patient Instructions (Signed)
Hello Breanna Webster,   It was so nice to talk to you on the phone today. I am sorry you continue to struggle with fatigue and shortness of breath. I just wanted to send you a recap of our discussion.   Please continue taking antibiotics prior to any dental work including cleanings (amoxicillin can be called in for you when requested or filled by your dentsit). You can discontinue plavix after 6 months of therapy (around 01/08/19). Let us know when you need refills and we will send it in. Please continue on aspirin 81 mg indefinitely.   You will be seen back by your primary cardiologist, Dr. Tamala Julian, in 3-4 months. I have sent a message to his nurse, Avis Epley, to see if this can be moved up as requested. I have also placed a referral for a sleep study and the coordinator should reach out to you to have this arranged.   I will see you back in 1 years time with an echo.   All appointment details are attached to this letter in your "after visit summary."  Please call us with any questions or concerns you may have and please stay safe during these uncertain times.  Breanna Webster

## 2018-08-30 NOTE — Progress Notes (Signed)
There is nothing further to do from CV standpoint at this time. I worried her symptoms might be multifactorial and this seems to be the case. Needs to enrol in cardiac/pulmonary rehab ASAP

## 2018-09-01 DIAGNOSIS — M47817 Spondylosis without myelopathy or radiculopathy, lumbosacral region: Secondary | ICD-10-CM | POA: Diagnosis not present

## 2018-09-01 DIAGNOSIS — M545 Low back pain: Secondary | ICD-10-CM | POA: Diagnosis not present

## 2018-09-01 LAB — BLOOD GAS, ARTERIAL

## 2018-09-01 NOTE — Progress Notes (Signed)
Breanna Webster can you help book this patient a follow up virtual visit with me? Thanks

## 2018-09-01 NOTE — Progress Notes (Signed)
Spoke with pt and made her aware of recommendations.  Pt appreciative for call.

## 2018-09-01 NOTE — Addendum Note (Signed)
Addended by: Loren Racer on: 09/01/2018 11:13 AM   Modules accepted: Orders

## 2018-09-03 ENCOUNTER — Telehealth: Payer: Self-pay | Admitting: Endocrinology

## 2018-09-03 ENCOUNTER — Telehealth: Payer: Self-pay | Admitting: Cardiology

## 2018-09-03 NOTE — Telephone Encounter (Signed)
° ° °  1) What problem are you experiencing? Patient wants to have sleep study scheduled (order in system [place])  2) Who is your medical equipment company? n/a   Please route to the sleep study assistant.

## 2018-09-03 NOTE — Telephone Encounter (Signed)
Attempted to call the pt about her request for records and there was no answer or vm pick up

## 2018-09-04 ENCOUNTER — Ambulatory Visit: Payer: Medicare Other | Admitting: Physician Assistant

## 2018-09-04 DIAGNOSIS — M545 Low back pain: Secondary | ICD-10-CM | POA: Diagnosis not present

## 2018-09-04 DIAGNOSIS — M47817 Spondylosis without myelopathy or radiculopathy, lumbosacral region: Secondary | ICD-10-CM | POA: Diagnosis not present

## 2018-09-04 NOTE — Telephone Encounter (Signed)
Returned call to assist patient and learned she had a lot of questions concerning the new home sleep device. I contacted York Cerise for help and he assisted the patient with everything needed to get the test taken. I will follow up with the patient today.

## 2018-09-05 ENCOUNTER — Telehealth: Payer: Self-pay | Admitting: Cardiology

## 2018-09-05 NOTE — Telephone Encounter (Signed)
New message   Patient is calling to get sleep study results per the previous message. Please call the patient.

## 2018-09-05 NOTE — Telephone Encounter (Signed)
Message routed to Breanna Webster

## 2018-09-08 ENCOUNTER — Telehealth (HOSPITAL_COMMUNITY): Payer: Self-pay | Admitting: *Deleted

## 2018-09-08 NOTE — Telephone Encounter (Signed)
Follow-up called made to patient regarding Cardiac Rehab referral, message left on voicemail.  Sol Passer, [place], ACSM CEP [phone]

## 2018-09-09 ENCOUNTER — Other Ambulatory Visit: Payer: Self-pay

## 2018-09-09 ENCOUNTER — Encounter: Payer: Self-pay | Admitting: Gastroenterology

## 2018-09-09 ENCOUNTER — Telehealth: Payer: Self-pay | Admitting: Cardiology

## 2018-09-09 ENCOUNTER — Ambulatory Visit (INDEPENDENT_AMBULATORY_CARE_PROVIDER_SITE_OTHER): Payer: Medicare Other | Admitting: Gastroenterology

## 2018-09-09 VITALS — Ht 65.0 in | Wt 202.0 lb

## 2018-09-09 DIAGNOSIS — Z7902 Long term (current) use of antithrombotics/antiplatelets: Secondary | ICD-10-CM

## 2018-09-09 DIAGNOSIS — K2100 Gastro-esophageal reflux disease with esophagitis, without bleeding: Secondary | ICD-10-CM

## 2018-09-09 DIAGNOSIS — K2270 Barrett's esophagus without dysplasia: Secondary | ICD-10-CM

## 2018-09-09 DIAGNOSIS — K760 Fatty (change of) liver, not elsewhere classified: Secondary | ICD-10-CM

## 2018-09-09 DIAGNOSIS — I2510 Atherosclerotic heart disease of native coronary artery without angina pectoris: Secondary | ICD-10-CM

## 2018-09-09 DIAGNOSIS — R932 Abnormal findings on diagnostic imaging of liver and biliary tract: Secondary | ICD-10-CM

## 2018-09-09 DIAGNOSIS — Z86010 Personal history of colonic polyps: Secondary | ICD-10-CM

## 2018-09-09 NOTE — Patient Instructions (Signed)
You have been scheduled for an abdominal ultrasound at [facility] imaging [facility] [address] on 09/29/18 at 745am. Please arrive 15 minutes prior to your appointment for registration. Make certain not to have anything to eat or drink 6 hours prior to your appointment. Should you need to reschedule your appointment, please contact radiology at [phone]. This test typically takes about 30 minutes to perform.  Thank you for entrusting me with your care and choosing [facility].  Dr Havery Moros

## 2018-09-09 NOTE — Progress Notes (Signed)
Virtual Visit via Video Note  I connected with Breanna Webster on 09/09/18 at  2:30 PM EDT by a video enabled telemedicine application and verified that I am speaking with the correct person using two identifiers.   I discussed the limitations of evaluation and management by telemedicine and the availability of in person appointments. The patient expressed understanding and agreed to proceed.  THIS ENCOUNTER IS A VIRTUAL VISIT DUE TO COVID-19 - PATIENT WAS NOT SEEN IN THE OFFICE. PATIENT HAS CONSENTED TO VIRTUAL VISIT / TELEMEDICINE VISIT USING WEB-EX   Location of patient: home Location of provider: office Persons participating: myself, patient, patient's husband  HPI :  73 y/o female here for a follow up visit. She history of reflux, short segment Barrett's esophagus, chronic cough, colon polyps.  She previously had significant problems with chronic cough of unclear etiology, as well as dyspnea. There was a question of whether or not her reflux was contributing to her cough. Her main reflux symptoms seemed fairly well controlled on her protonix. She went through a workup to include EGD, barium study, and manometry / pH testing. Results showed she had no evidence of aspiration. EGD showed a short segment of BE with LA grade B esophagitis and loose fundoplication with paraesophageal hernia. Her manometry was normal and pH test showed a Demeester score (0.4) and symptoms did NOT correlate with any reflux episodes. In this light, I did not recommend surgery to repair her hiatal hernia and redo Niseen.  She had continued to have dyspnea, thought to be related to aortic stenosis. She is s/p TAVR on 07/08/18 for aortic stenosis. She is on aspirin and Plavix per cardiology. She has also had a DES placed to the [facility] on 03/2018. She continues to have dyspnea despite both of these measures. She was referred for sleep study to rule out OSA, that is pending.   During workup for TAVR she had a CT scan  06/17/18 which showed esophageal thickening of the lower thoracic esophagus, unchanged from 2013 CT. She was also reported to have liver surface "finely irregular, cannot exclude cirrhosis". She otherwise has ongoing back pain for which she is being treated with epidurals.   She has been doing fairly well on the reflux regimen - protonix 63m once daily, she thinks her reflux is pretty well controlled. No pyrosis that bothers her, and only minimal regurgitation. Sleeping with head of bed elevated. She is also taking chlorpheniramine at night per pulmonary. She is not having much cough otherwise more recently.  Of note, she has had an ALT elevation over the years. In 2017 she had a serologic workup which did not reveal any evidence of chronic liver disease. [place] her liver showed fatty infiltration at the time which is the suspected cause of transaminitis. She adamantly denies any alcohol use. She is adopted without any FH of known liver disease.   She otherwise had 28 adenomas removed on her last colonoscopy in 2018. She denies any problems with her bowel habits, no blood in the stools. She is anxious to have another colonoscopy for surveillance. Genetic testing was done and negative.   Endoscopic history: EGD 03/20/2017 - irregular z line, LA grade B esophagitis, small paraesophageal hernia, loose Nissen wrap, mild gastritis, duodenal erosions - path c/w Barrett's, H pylori negative  Colonoscopy 03/20/2017 - pancolonic diverticulosis, 28 polyps removed - EGD 2002 - suspected segment of BE, no BE on biopsies EGD 2006 - esophagitis and gastritis, no evidence of BE Colonoscopy  5/05 - diverticulosis Colonoscopy 4/15 - 4 small adenomas  Prior imaging: CT scan 06/17/18 - thickening of the esophagus unchanged from 2013, "finely irregular liver surface, cannot exclude cirrhosis", no mass lesions US abdomen - 06/13/2015 - fatty liver  Past Medical History:  Diagnosis Date  . Arthritis    "back"  (04/22/2018)  . CAD (coronary artery disease)    a. 03/2018 s/p PCI/DES to the RCA (3.0x15 Onyx DES).  . Carotid artery stenosis    Mild  . Chronic lower back pain   . Colon polyps   . Diverticulitis   . Diverticulosis   . Esophageal thickening    seen on pre TAVR CT scan, also questionable cirrhosis. MRI recommended. Will refer to GI  . GERD (gastroesophageal reflux disease)   . Grave's disease   . History of colonic polyps 05/22/2017  . History of hiatal hernia   . Hypertension   . Hypothyroidism   . IBS (irritable bowel syndrome)   . Osteopenia   . Pulmonary nodules    seen on pre TAVR CT. likley benign. no follow up recommended if pt low risk.  . S/P TAVR (transcatheter aortic valve replacement)   . Severe aortic stenosis   . Thalassemia minor   . Type II diabetes mellitus ([place])      Past Surgical History:  Procedure Laterality Date  . [place] STUDY N/A 03/03/2018   Procedure: [place] STUDY;  Surgeon: Mauri Pole, MD;  Location: [facility];  Service: Endoscopy;  Laterality: N/A;  . COLONOSCOPY W/ BIOPSIES AND POLYPECTOMY    . CORONARY ANGIOGRAPHY Right 04/21/2018   Procedure: CORONARY ANGIOGRAPHY (CATH LAB);  Surgeon: [name], MD;  Location: [place] [facility];  Service: Cardiovascular;  Laterality: Right;  . CORONARY STENT INTERVENTION N/A 04/22/2018   Procedure: CORONARY STENT INTERVENTION;  Surgeon: [name], MD;  Location: [place] [facility];  Service: Cardiovascular;  Laterality: N/A;  . DILATION AND CURETTAGE OF UTERUS    . ESOPHAGEAL MANOMETRY N/A 03/03/2018   Procedure: ESOPHAGEAL MANOMETRY (EM);  Surgeon: Mauri Pole, MD;  Location: [facility];  Service: Endoscopy;  Laterality: N/A;  . HERNIA REPAIR    . HYSTEROSCOPY     fibroids  . LAPAROSCOPIC CHOLECYSTECTOMY    . LAPAROSCOPY     fibroids  . NISSEN FUNDOPLICATION  1027O  . RIGHT/LEFT HEART CATH AND CORONARY ANGIOGRAPHY N/A 02/20/2018   Procedure: RIGHT/LEFT HEART  CATH AND CORONARY ANGIOGRAPHY;  Surgeon: [name], MD;  Location: [place] [facility];  Service: Cardiovascular;  Laterality: N/A;  . TEE WITHOUT CARDIOVERSION N/A 07/08/2018   Procedure: TRANSESOPHAGEAL ECHOCARDIOGRAM (TEE);  Surgeon: Burnell Blanks, MD;  Location: [place];  Service: Open Heart Surgery;  Laterality: N/A;  . TONSILLECTOMY    . TRANSCATHETER AORTIC VALVE REPLACEMENT, TRANSFEMORAL N/A 07/08/2018   Procedure: TRANSCATHETER AORTIC VALVE REPLACEMENT, TRANSFEMORAL;  Surgeon: Burnell Blanks, MD;  Location: [place];  Service: Open Heart Surgery;  Laterality: N/A;   Family History  Adopted: Yes  Problem Relation Age of Onset  . Healthy Son        x 2  . Headache Other        Cluster headaches  . Heart failure Mother   . Colon cancer Neg Hx   . Pancreatic cancer Neg Hx   . Rectal cancer Neg Hx   . Stomach cancer Neg Hx    Social History   Tobacco Use  . Smoking status: Never  Smoker  . Smokeless tobacco: Never Used  Substance Use Topics  . Alcohol use: No    Alcohol/week: 0.0 standard drinks  . Drug use: Never   Current Outpatient Medications  Medication Sig Dispense Refill  . acetaminophen (TYLENOL) 500 MG tablet Take 1,000 mg by mouth every 6 (six) hours as needed (for pain.).    Marland Kitchen aspirin 81 MG tablet Take 81 mg by mouth daily.      . bisoprolol (ZEBETA) 5 MG tablet TAKE 1 TABLET BY MOUTH EVERY DAY 90 tablet 0  . chlorpheniramine (CHLOR-TRIMETON) 4 MG tablet Take 4 mg by mouth at bedtime.     . cholecalciferol (VITAMIN D) 1000 units tablet Take 1,000 Units by mouth daily.    . clopidogrel (PLAVIX) 75 MG tablet Take 1 tablet (75 mg total) by mouth daily with breakfast. 30 tablet 6  . escitalopram (LEXAPRO) 20 MG tablet TAKE 1 TABLET BY MOUTH EVERY DAY 90 tablet 0  . hydrochlorothiazide (HYDRODIURIL) 12.5 MG tablet TAKE 1 TABLET BY MOUTH EVERY DAY 90 tablet 0  . insulin NPH Human (NOVOLIN N RELION) 100 UNIT/ML injection Inject 1.2 mLs (120 Units total)  into the skin every morning. And syringes 1/day 40 mL 11  . Insulin Syringe-Needle U-100 (BD INSULIN SYRINGE U/F) 31G X 5/16" 1 ML MISC USE TO INJECT INSULIN ONCE DAILY 90 each 2  . losartan (COZAAR) 50 MG tablet TAKE 1 TABLET(50 MG) BY MOUTH DAILY 90 tablet 0  . Multiple Vitamin (MULITIVITAMIN WITH MINERALS) TABS Take 1 tablet by mouth daily.    . nitroGLYCERIN (NITROSTAT) 0.4 MG SL tablet Place 1 tablet (0.4 mg total) under the tongue every 5 (five) minutes as needed for chest pain. 25 tablet 3  . nortriptyline (PAMELOR) 25 MG capsule Take 1 capsule (25 mg total) by mouth at bedtime. 90 capsule 1  . ONETOUCH DELICA LANCETS 14E MISC USE TO CHECK BLOOD SUGAR TWICE DAILY 200 each 0  . ONETOUCH VERIO test strip USE TO CHECK BLOOD SUGAR TWICE DAILY 200 each 0  . pantoprazole (PROTONIX) 40 MG tablet Take 1 tablet (40 mg total) by mouth 2 (two) times daily. 60 tablet 2  . SYNTHROID 175 MCG tablet TAKE 1 TABLET BY MOUTH EVERY DAY (Patient taking differently: Take 175 mcg by mouth daily before breakfast. ) 90 tablet 0   No current facility-administered medications for this visit.    Allergies  Allergen Reactions  . Statins Other (See Comments)    Muscle aches     Review of Systems: All systems reviewed and negative except where noted in HPI.   Lab Results  Component Value Date   WBC 9.7 07/09/2018   HGB 10.3 (L) 07/09/2018   HCT 33.3 (L) 07/09/2018   MCV 63.1 (L) 07/09/2018   PLT 178 07/09/2018    Lab Results  Component Value Date   CREATININE 0.89 07/09/2018   BUN 17 07/09/2018   NA 137 07/09/2018   K 4.1 07/09/2018   CL 107 07/09/2018   CO2 22 07/09/2018    Lab Results  Component Value Date   ALT 80 (H) 07/07/2018   AST 83 (H) 07/07/2018   ALKPHOS 90 07/07/2018   BILITOT 0.6 07/07/2018    Lab Results  Component Value Date   INR 1.1 07/07/2018      Physical Exam: NA  ASSESSMENT AND PLAN: 73 y/o female here for reassessment of the following:  GERD / Barrett's  esophagus - prior EGD showed esophagitis and evidence of Barrett's without  dysplasia, in the setting of a loose Nissen, although on high dose PPI her pH testing was normal and cough did not correlate to reflux episodes, so I did not think reflux was driving her cough. She is anxious about the diagnosis of Barrett's. I reassured her her segment is extremely short and the risk of esophageal cancer is extremely low. She is anxious to have a follow up EGD after several months of high dose PPI to ensure healing of esophagitis and ensure no dysplasia. As below, will coordinate when cleared by cardiology.   Abnormal liver imaging / fatty liver - prior workup showed fatty liver as cause of elevated ALT. She had no evidence of cirrhosis on [name] in 2017, with CT raising the question of cirrhosis. If she has cirrhosis it is mild, her platelets and INR are normal and she is compensated. We discussed ways to further assess this. I offered her [name] with elastography to further evaluate her risk for cirrhosis, and she wanted to proceed with that. We discussed what it entailed and will contact he with results when available.   History of colon polyps / antiplatelet therapy - 28 adenomas removed on last colonoscopy, due for a surveillance exam. I will reach out to her Cardiologist to see if and when it would be okay to hold the Plavix for colonoscopy and will get back to her. She agreed. Prior genetic testing negative.   Gotha Cellar, MD [facility]

## 2018-09-09 NOTE — Telephone Encounter (Signed)
New Message    Pt is calling for sleep study results    Please call

## 2018-09-10 ENCOUNTER — Telehealth: Payer: Self-pay | Admitting: Gastroenterology

## 2018-09-10 NOTE — Telephone Encounter (Signed)
Breanna Webster can you help relay the following: - I spoke with Dr. Tamala Julian of cardiology. Due to stent on 12/31 she cannot come off plavix for at least 6 months since that time frame. We will plan on performing her EGD and colonoscopy in July, if you can let her know, and she can call next month to schedule when our schedule opens up. For that exam she will need to hold the Plavix for 5 days and at that time will need cardiology approval to hold it. Thanks much

## 2018-09-10 NOTE — Telephone Encounter (Signed)
The patient has been notified of this information and all questions answered. The pt has been advised of the information and verbalized understanding.    

## 2018-09-10 NOTE — Telephone Encounter (Signed)
Patient has taken the test and now wants the results. I will look into this and get back with her.

## 2018-09-10 NOTE — Telephone Encounter (Signed)
Reached out to patient to let her know that this is a new product and I am working on getting her test result information. Patient asked if Suezanne Jacquet could call her. I told her I would send a message to [place].

## 2018-09-10 NOTE — Telephone Encounter (Signed)
Reached out to patient to let her know that this is a new product and I am working on getting her test result information. Patient asked if Breanna Webster could call her. I told her I would send a message to [place].

## 2018-09-11 ENCOUNTER — Ambulatory Visit: Payer: Medicare Other

## 2018-09-12 NOTE — Telephone Encounter (Signed)
The patient stated she did not sleep at all during the night she wore the cloudpat. She wanted to know what can be done since this occurred.

## 2018-09-14 NOTE — Telephone Encounter (Signed)
According to the study she slept almost 7 hours

## 2018-09-15 ENCOUNTER — Encounter: Payer: Self-pay | Admitting: Gastroenterology

## 2018-09-16 NOTE — Telephone Encounter (Signed)
Patient states she did not sleep at all but was up all night walking around and could not fall asleep. Upon getting up in the morning she removed the equipment and was groggy from not sleeping. Patient states she does not sleep walk. Patient wants to know what happens now.

## 2018-09-16 NOTE — Telephone Encounter (Addendum)
Patient states she did not sleep at all but was up all night walking around and could not fall asleep. Upon getting up in the morning she removed the equipment and was groggy from not sleeping. Patient states she does not sleep walk. Patient wants to know what happens now.           Breanna Margarita, MD       According to the study she slept almost 7 hours        The patient stated she did not sleep at all during the night she wore the cloudpat. She wanted to know what can be done since this occurred

## 2018-09-16 NOTE — Telephone Encounter (Signed)
Response routed to Dr Radford Pax for more direction.

## 2018-09-16 NOTE — Telephone Encounter (Signed)
Does she normally have problems sleeping at night

## 2018-09-25 DIAGNOSIS — M47817 Spondylosis without myelopathy or radiculopathy, lumbosacral region: Secondary | ICD-10-CM | POA: Diagnosis not present

## 2018-09-25 DIAGNOSIS — M545 Low back pain: Secondary | ICD-10-CM | POA: Diagnosis not present

## 2018-09-29 ENCOUNTER — Telehealth: Payer: Self-pay | Admitting: Interventional Cardiology

## 2018-09-29 ENCOUNTER — Other Ambulatory Visit: Payer: Medicare Other

## 2018-09-29 NOTE — Telephone Encounter (Signed)
New Message    Patient would like to speak to [facility] about situations that happened after her Aortic Valve was replaced.

## 2018-09-29 NOTE — Telephone Encounter (Signed)
She needs cardiopulmonary rehab.

## 2018-09-30 ENCOUNTER — Other Ambulatory Visit: Payer: Self-pay | Admitting: Physical Medicine and Rehabilitation

## 2018-09-30 ENCOUNTER — Ambulatory Visit
Admission: RE | Admit: 2018-09-30 | Discharge: 2018-09-30 | Disposition: A | Discharge status: Home / Self Care | Payer: Medicare Other | Source: Ambulatory Visit | Attending: Gastroenterology | Admitting: Gastroenterology

## 2018-09-30 ENCOUNTER — Other Ambulatory Visit: Payer: Self-pay | Admitting: Endocrinology

## 2018-09-30 ENCOUNTER — Ambulatory Visit: Payer: Medicare Other | Admitting: Gastroenterology

## 2018-09-30 ENCOUNTER — Other Ambulatory Visit: Payer: Self-pay | Admitting: Adult Health

## 2018-09-30 ENCOUNTER — Telehealth: Payer: Self-pay | Admitting: Adult Health

## 2018-09-30 DIAGNOSIS — M545 Low back pain, unspecified: Secondary | ICD-10-CM

## 2018-09-30 DIAGNOSIS — R945 Abnormal results of liver function studies: Secondary | ICD-10-CM | POA: Diagnosis not present

## 2018-09-30 DIAGNOSIS — K760 Fatty (change of) liver, not elsewhere classified: Secondary | ICD-10-CM

## 2018-09-30 DIAGNOSIS — R932 Abnormal findings on diagnostic imaging of liver and biliary tract: Secondary | ICD-10-CM

## 2018-09-30 NOTE — Telephone Encounter (Signed)
Ok to refill for 90 days but needs to come in for a CPE in that time

## 2018-09-30 NOTE — Telephone Encounter (Signed)
Spoke with pt in regards to another matter.  Noticed this note and inquired.  She states that the only time she has trouble sleeping is if she has reflux and it wakes her up during the night.  About twice a month she will have a bad night where she just absolutely cannot fall asleep.  Otherwise, she sleeps perfectly fine.  States her sleep study was done at home with brand new equipment and pt felt like the people from the company weren't really sure of what they were doing.  Pt and her husband stated she did not sleep the entire night and there was no way the study could have shown she slept 7 hrs.  She thinks maybe this is someone else's test.  Advised I will send message to Dr. Radford Pax with this updated information.

## 2018-09-30 NOTE — Telephone Encounter (Signed)
Spoke with pt and made her aware of Dr. Thompson Caul recommendation.  Advised once rehab opens back up, it will be vital for her to participate.  Pt verbalized understanding.

## 2018-09-30 NOTE — Telephone Encounter (Signed)
Copied from [place] [id number]. Topic: Quick Communication - Rx Refill/Question >> Sep 30, 2018 12:53 PM Leward Quan A wrote: Medication: losartan (COZAAR) 50 MG tablet, nortriptyline (PAMELOR) 25 MG capsule  (90) days  Has the patient contacted their pharmacy? Yes.   (Agent: If no, request that the patient contact the pharmacy for the refill.) (Agent: If yes, when and what did the pharmacy advise?)  Preferred Pharmacy (with phone number or street name): [id number] - [place], [address] AT [place] [phone] (Phone) [phone] (Fax)    Agent: Please be advised that RX refills may take up to 3 business days. We ask that you follow-up with your pharmacy.

## 2018-10-01 ENCOUNTER — Other Ambulatory Visit: Payer: Self-pay

## 2018-10-01 ENCOUNTER — Ambulatory Visit (INDEPENDENT_AMBULATORY_CARE_PROVIDER_SITE_OTHER): Payer: Medicare Other | Admitting: Adult Health

## 2018-10-01 DIAGNOSIS — E559 Vitamin D deficiency, unspecified: Secondary | ICD-10-CM | POA: Diagnosis not present

## 2018-10-01 DIAGNOSIS — R0602 Shortness of breath: Secondary | ICD-10-CM

## 2018-10-01 DIAGNOSIS — D649 Anemia, unspecified: Secondary | ICD-10-CM

## 2018-10-01 DIAGNOSIS — R5383 Other fatigue: Secondary | ICD-10-CM

## 2018-10-01 DIAGNOSIS — E118 Type 2 diabetes mellitus with unspecified complications: Secondary | ICD-10-CM | POA: Diagnosis not present

## 2018-10-01 DIAGNOSIS — E038 Other specified hypothyroidism: Secondary | ICD-10-CM | POA: Diagnosis not present

## 2018-10-01 DIAGNOSIS — IMO0002 Reserved for concepts with insufficient information to code with codable children: Secondary | ICD-10-CM

## 2018-10-01 DIAGNOSIS — E0865 Diabetes mellitus due to underlying condition with hyperglycemia: Secondary | ICD-10-CM | POA: Diagnosis not present

## 2018-10-01 DIAGNOSIS — I2510 Atherosclerotic heart disease of native coronary artery without angina pectoris: Secondary | ICD-10-CM | POA: Diagnosis not present

## 2018-10-01 DIAGNOSIS — E1165 Type 2 diabetes mellitus with hyperglycemia: Secondary | ICD-10-CM

## 2018-10-01 MED ORDER — PANTOPRAZOLE SODIUM 40 MG PO TBEC: 40.0000 mg | DELAYED_RELEASE_TABLET | Freq: Two times a day (BID) | ORAL | 5 refills | Status: DC

## 2018-10-01 NOTE — Telephone Encounter (Signed)
Please set up an in lab sleep study

## 2018-10-01 NOTE — Telephone Encounter (Signed)
Please advise if you want to refill this medication. It does not look like we manage her thyroid.  Last refilled 07/11/2018.  No recent labs since 07/2017

## 2018-10-01 NOTE — Telephone Encounter (Signed)
Rx sent to the pharmacy by e-scribe. 

## 2018-10-01 NOTE — Telephone Encounter (Signed)
Sent to the pharmacy by e-scribe for 90 days.  Pt scheduled to discuss treatment with [facility] today.

## 2018-10-02 ENCOUNTER — Encounter: Payer: Self-pay | Admitting: Adult Health

## 2018-10-02 NOTE — Progress Notes (Addendum)
Subjective:    Patient ID: Breanna Webster, female    DOB: 1945-07-31, 73 y.o.   MRN: [id number]  HPI  Virtual Visit via Video Note  I connected with Breanna Webster  on 10/02/18 at  3:00 PM EDT by a video enabled telemedicine application and verified that I am speaking with the correct person using two identifiers.  Location patient: home Location provider:work or home office Persons participating in the virtual visit: patient, provider  I discussed the limitations of evaluation and management by telemedicine and the availability of in person appointments. The patient expressed understanding and agreed to proceed.   HPI: 73 year old female who following up today regarding multiple issues  For the last few years she has had progressive exertional fatigue and dyspnea which had progressed.  He has been followed by Dr. Shyrl Numbers for shortness of breath and had pulmonary function testing done in 2018 which showed moderate restriction.  She was also evaluated by GI medicine to assess if reflux might be causing her symptoms.  In July 2019 she had a modified barium swallow which showed no signs of reflux and a normal swallowing function.  She had an echocardiogram done in August 2019 which showed a trileaflet aortic valve with moderate thickening and calcification.  There was some restriction in mobility.  There went a cardiac catheterization by Dr. Elvera Lennox 2019 which showed a 70 to 80% mid RCA stenosis.  The LAD, left main, circumflex are widely patent.  He then underwent a PCI of the RCA on 04/22/2018 but did not have any improvement in her symptoms afterwards.  She continued to have progressive exertional fatigue and shortness of breath despite having any chest pain she underwent a dobutamine stress echo on 05/14/2018.  This showed a mean aortic gradient of 17 mmHg at baseline which increased to 40 mmHg at peak stress.  She was then evaluated by the multidisciplinary valve team.  Due to her  unexplained symptoms it was felt though she had severe LF LG left ear and she underwent successful TAVR 07/08/2018.  Echocardiogram from 08/29/2018 showed normal functioning TAVR.  Today she reports that she continues to have shortness of breath with exertion and chronic fatigue.  Per patient "I can take a 5-hour nap, wake up and shortly go back to bed for the night.  She is doing exercise by walking every day but she can only walk 1-2 blocks before she has to rest due to shortness of breath and fatigue.   She did have a home sleep study done, which according to the study showed this that she slept almost 7 hours.  Patient reports that she did not sleep at all and was up all night walking around and she could not fall asleep.  Her husband who is with her on this call does report that she snores.  Additionally, she would like to switch endocrinologist for diabetes management.  She is currently seeing Dr. Loanne Drilling.  She tried to get an appointment with Dr. Buddy Duty at [facility] [facility] but she was advised that he would not do anything additional.  She feels as though an endocrine disorder may be causing her generalized fatigue.   ROS: See pertinent positives and negatives per HPI.  Past Medical History:  Diagnosis Date  . Arthritis    "back" (04/22/2018)  . CAD (coronary artery disease)    a. 03/2018 s/p PCI/DES to the RCA (3.0x15 Onyx DES).  . Carotid artery stenosis    Mild  . Chronic lower back  pain   . Colon polyps   . Diverticulitis   . Diverticulosis   . Esophageal thickening    seen on pre TAVR CT scan, also questionable cirrhosis. MRI recommended. Will refer to GI  . GERD (gastroesophageal reflux disease)   . Grave's disease   . History of colonic polyps 05/22/2017  . History of hiatal hernia   . Hypertension   . Hypothyroidism   . IBS (irritable bowel syndrome)   . Osteopenia   . Pulmonary nodules    seen on pre TAVR CT. likley benign. no follow up recommended if pt low risk.  . S/P TAVR  (transcatheter aortic valve replacement)   . Severe aortic stenosis   . Thalassemia minor   . Type II diabetes mellitus ([place])     Past Surgical History:  Procedure Laterality Date  . [place] STUDY N/A 03/03/2018   Procedure: [place] STUDY;  Surgeon: Mauri Pole, MD;  Location: [facility];  Service: Endoscopy;  Laterality: N/A;  . COLONOSCOPY W/ BIOPSIES AND POLYPECTOMY    . CORONARY ANGIOGRAPHY Right 04/21/2018   Procedure: CORONARY ANGIOGRAPHY (CATH LAB);  Surgeon: [name], MD;  Location: [facility];  Service: Cardiovascular;  Laterality: Right;  . CORONARY STENT INTERVENTION N/A 04/22/2018   Procedure: CORONARY STENT INTERVENTION;  Surgeon: [name], MD;  Location: [place] [facility];  Service: Cardiovascular;  Laterality: N/A;  . DILATION AND CURETTAGE OF UTERUS    . ESOPHAGEAL MANOMETRY N/A 03/03/2018   Procedure: ESOPHAGEAL MANOMETRY (EM);  Surgeon: Mauri Pole, MD;  Location: [facility];  Service: Endoscopy;  Laterality: N/A;  . HERNIA REPAIR    . HYSTEROSCOPY     fibroids  . LAPAROSCOPIC CHOLECYSTECTOMY    . LAPAROSCOPY     fibroids  . NISSEN FUNDOPLICATION  [id number]  . RIGHT/LEFT HEART CATH AND CORONARY ANGIOGRAPHY N/A 02/20/2018   Procedure: RIGHT/LEFT HEART CATH AND CORONARY ANGIOGRAPHY;  Surgeon: [name], MD;  Location: [place] [facility];  Service: Cardiovascular;  Laterality: N/A;  . TEE WITHOUT CARDIOVERSION N/A 07/08/2018   Procedure: TRANSESOPHAGEAL ECHOCARDIOGRAM (TEE);  Surgeon: Burnell Blanks, MD;  Location: [facility];  Service: Open Heart Surgery;  Laterality: N/A;  . TONSILLECTOMY    . TRANSCATHETER AORTIC VALVE REPLACEMENT, TRANSFEMORAL N/A 07/08/2018   Procedure: TRANSCATHETER AORTIC VALVE REPLACEMENT, TRANSFEMORAL;  Surgeon: Burnell Blanks, MD;  Location: [facility];  Service: Open Heart Surgery;  Laterality: N/A;    Family History  Adopted: Yes  Problem Relation Age of Onset  . Healthy Son         x 2  . Headache Other        Cluster headaches  . Heart failure Mother   . Colon cancer Neg Hx   . Pancreatic cancer Neg Hx   . Rectal cancer Neg Hx   . Stomach cancer Neg Hx       Current Outpatient Medications:  .  acetaminophen (TYLENOL) 500 MG tablet, Take 1,000 mg by mouth every 6 (six) hours as needed (for pain.)., Disp: , Rfl:  .  aspirin 81 MG tablet, Take 81 mg by mouth daily.  , Disp: , Rfl:  .  bisoprolol (ZEBETA) 5 MG tablet, TAKE 1 TABLET BY MOUTH EVERY DAY, Disp: 90 tablet, Rfl: 0 .  chlorpheniramine (CHLOR-TRIMETON) 4 MG tablet, Take 4 mg by mouth at bedtime. , Disp: , Rfl:  .  cholecalciferol (VITAMIN D) 1000 units tablet, Take 1,000 Units by  mouth daily., Disp: , Rfl:  .  clopidogrel (PLAVIX) 75 MG tablet, Take 1 tablet (75 mg total) by mouth daily with breakfast., Disp: 30 tablet, Rfl: 6 .  escitalopram (LEXAPRO) 20 MG tablet, TAKE 1 TABLET BY MOUTH EVERY DAY, Disp: 90 tablet, Rfl: 0 .  hydrochlorothiazide (HYDRODIURIL) 12.5 MG tablet, TAKE 1 TABLET BY MOUTH EVERY DAY, Disp: 90 tablet, Rfl: 0 .  insulin NPH Human (NOVOLIN N RELION) 100 UNIT/ML injection, Inject 1.2 mLs (120 Units total) into the skin every morning. And syringes 1/day, Disp: 40 mL, Rfl: 11 .  Insulin Syringe-Needle U-100 (BD INSULIN SYRINGE U/F) 31G X 5/16" 1 ML MISC, USE TO INJECT INSULIN ONCE DAILY, Disp: 90 each, Rfl: 2 .  losartan (COZAAR) 50 MG tablet, TAKE 1 TABLET(50 MG) BY MOUTH DAILY, Disp: 90 tablet, Rfl: 0 .  Multiple Vitamin (MULITIVITAMIN WITH MINERALS) TABS, Take 1 tablet by mouth daily., Disp: , Rfl:  .  nitroGLYCERIN (NITROSTAT) 0.4 MG SL tablet, Place 1 tablet (0.4 mg total) under the tongue every 5 (five) minutes as needed for chest pain., Disp: 25 tablet, Rfl: 3 .  nortriptyline (PAMELOR) 25 MG capsule, TAKE 1 CAPSULE(25 MG) BY MOUTH AT BEDTIME, Disp: 90 capsule, Rfl: 0 .  ONETOUCH DELICA LANCETS 85Y MISC, USE TO CHECK BLOOD SUGAR TWICE DAILY, Disp: 200 each, Rfl: 0 .  ONETOUCH VERIO  test strip, USE TO CHECK BLOOD SUGAR TWICE DAILY, Disp: 200 each, Rfl: 0 .  pantoprazole (PROTONIX) 40 MG tablet, Take 1 tablet (40 mg total) by mouth 2 (two) times daily., Disp: 60 tablet, Rfl: 5 .  SYNTHROID 175 MCG tablet, TAKE 1 TABLET BY MOUTH EVERY DAY, Disp: 90 tablet, Rfl: 0  EXAM:  VITALS per patient if applicable:  GENERAL: alert, oriented, appears well and in no acute distress  HEENT: atraumatic, conjunttiva clear, no obvious abnormalities on inspection of external nose and ears  NECK: normal movements of the head and neck  LUNGS: on inspection no signs of respiratory distress, breathing rate appears normal, no obvious gross SOB, gasping or wheezing  CV: no obvious cyanosis  MS: moves all visible extremities without noticeable abnormality  PSYCH/NEURO: pleasant and cooperative, no obvious depression or anxiety, speech and thought processing grossly intact  ASSESSMENT AND PLAN:  Discussed the following assessment and plan:  1. Diabetes mellitus type 2, uncontrolled, with complications ([place])  - Ambulatory referral to Endocrinology  2. Fatigue, unspecified type -Unspecified cause.  Will check some lab work.  Cannot rule out chronic fatigue syndrome at this point in time.  We can always consider referral to rheumatology for further evaluation - Iron and TIBC; Future - TSH; Future - C-reactive Protein; Future - Sedimentation Rate; Future - Vitamin D, 25-hydroxy; Future - Vitamin B12; Future - Heavy Metals Profile, Urine; Future - ANA; Future - DG Chest 2 View; Future  3. Shortness of breath  - Iron and TIBC; Future - TSH; Future - C-reactive Protein; Future - Sedimentation Rate; Future - Vitamin D, 25-hydroxy; Future - Vitamin B12; Future - Heavy Metals Profile, Urine; Future - ANA; Future - DG Chest 2 View; Future  4. Other specified hypothyroidism  - TSH; Future  5. Vitamin D deficiency  - Vitamin D, 25-hydroxy; Future  6. Anemia, unspecified type   - Iron and TIBC; Future - Vitamin B12; Future    I discussed the assessment and treatment plan with the patient. The patient was provided an opportunity to ask questions and all were answered. The patient agreed with the  plan and demonstrated an understanding of the instructions.   The patient was advised to call back or seek an in-person evaluation if the symptoms worsen or if the condition fails to improve as anticipated.   [name], NP       Review of Systems     Objective:   Physical Exam        Assessment & Plan:

## 2018-10-03 ENCOUNTER — Emergency Department (HOSPITAL_COMMUNITY): Payer: Medicare Other

## 2018-10-03 ENCOUNTER — Other Ambulatory Visit: Payer: Self-pay

## 2018-10-03 ENCOUNTER — Encounter (HOSPITAL_COMMUNITY): Payer: Self-pay | Admitting: *Deleted

## 2018-10-03 ENCOUNTER — Inpatient Hospital Stay (HOSPITAL_COMMUNITY)
Admission: EM | Admit: 2018-10-03 | Discharge: 2018-10-10 | DRG: 871 | Disposition: A | Discharge status: Home Health | Payer: Medicare Other | Attending: Internal Medicine | Admitting: Internal Medicine

## 2018-10-03 DIAGNOSIS — E785 Hyperlipidemia, unspecified: Secondary | ICD-10-CM | POA: Diagnosis present

## 2018-10-03 DIAGNOSIS — A419 Sepsis, unspecified organism: Secondary | ICD-10-CM | POA: Diagnosis not present

## 2018-10-03 DIAGNOSIS — E876 Hypokalemia: Secondary | ICD-10-CM | POA: Diagnosis present

## 2018-10-03 DIAGNOSIS — R6520 Severe sepsis without septic shock: Secondary | ICD-10-CM | POA: Diagnosis present

## 2018-10-03 DIAGNOSIS — B955 Unspecified streptococcus as the cause of diseases classified elsewhere: Secondary | ICD-10-CM

## 2018-10-03 DIAGNOSIS — K219 Gastro-esophageal reflux disease without esophagitis: Secondary | ICD-10-CM | POA: Diagnosis present

## 2018-10-03 DIAGNOSIS — Z953 Presence of xenogenic heart valve: Secondary | ICD-10-CM

## 2018-10-03 DIAGNOSIS — I447 Left bundle-branch block, unspecified: Secondary | ICD-10-CM | POA: Diagnosis present

## 2018-10-03 DIAGNOSIS — F329 Major depressive disorder, single episode, unspecified: Secondary | ICD-10-CM | POA: Diagnosis present

## 2018-10-03 DIAGNOSIS — E039 Hypothyroidism, unspecified: Secondary | ICD-10-CM | POA: Diagnosis present

## 2018-10-03 DIAGNOSIS — I058 Other rheumatic mitral valve diseases: Secondary | ICD-10-CM

## 2018-10-03 DIAGNOSIS — A408 Other streptococcal sepsis: Secondary | ICD-10-CM | POA: Diagnosis not present

## 2018-10-03 DIAGNOSIS — Z8249 Family history of ischemic heart disease and other diseases of the circulatory system: Secondary | ICD-10-CM

## 2018-10-03 DIAGNOSIS — I059 Rheumatic mitral valve disease, unspecified: Secondary | ICD-10-CM

## 2018-10-03 DIAGNOSIS — E871 Hypo-osmolality and hyponatremia: Secondary | ICD-10-CM | POA: Diagnosis not present

## 2018-10-03 DIAGNOSIS — Z20828 Contact with and (suspected) exposure to other viral communicable diseases: Secondary | ICD-10-CM | POA: Diagnosis not present

## 2018-10-03 DIAGNOSIS — Z79890 Hormone replacement therapy (postmenopausal): Secondary | ICD-10-CM

## 2018-10-03 DIAGNOSIS — I248 Other forms of acute ischemic heart disease: Secondary | ICD-10-CM | POA: Diagnosis not present

## 2018-10-03 DIAGNOSIS — I272 Pulmonary hypertension, unspecified: Secondary | ICD-10-CM | POA: Diagnosis present

## 2018-10-03 DIAGNOSIS — Z7902 Long term (current) use of antithrombotics/antiplatelets: Secondary | ICD-10-CM

## 2018-10-03 DIAGNOSIS — Z952 Presence of prosthetic heart valve: Secondary | ICD-10-CM

## 2018-10-03 DIAGNOSIS — Z794 Long term (current) use of insulin: Secondary | ICD-10-CM

## 2018-10-03 DIAGNOSIS — K7460 Unspecified cirrhosis of liver: Secondary | ICD-10-CM | POA: Diagnosis not present

## 2018-10-03 DIAGNOSIS — I10 Essential (primary) hypertension: Secondary | ICD-10-CM | POA: Diagnosis present

## 2018-10-03 DIAGNOSIS — E1165 Type 2 diabetes mellitus with hyperglycemia: Secondary | ICD-10-CM | POA: Diagnosis present

## 2018-10-03 DIAGNOSIS — I330 Acute and subacute infective endocarditis: Secondary | ICD-10-CM | POA: Diagnosis not present

## 2018-10-03 DIAGNOSIS — G9341 Metabolic encephalopathy: Secondary | ICD-10-CM | POA: Diagnosis present

## 2018-10-03 DIAGNOSIS — Z955 Presence of coronary angioplasty implant and graft: Secondary | ICD-10-CM

## 2018-10-03 DIAGNOSIS — R0602 Shortness of breath: Secondary | ICD-10-CM | POA: Diagnosis not present

## 2018-10-03 DIAGNOSIS — I2510 Atherosclerotic heart disease of native coronary artery without angina pectoris: Secondary | ICD-10-CM | POA: Diagnosis present

## 2018-10-03 DIAGNOSIS — R001 Bradycardia, unspecified: Secondary | ICD-10-CM | POA: Diagnosis present

## 2018-10-03 DIAGNOSIS — I25118 Atherosclerotic heart disease of native coronary artery with other forms of angina pectoris: Secondary | ICD-10-CM | POA: Diagnosis present

## 2018-10-03 DIAGNOSIS — IMO0002 Reserved for concepts with insufficient information to code with codable children: Secondary | ICD-10-CM | POA: Diagnosis present

## 2018-10-03 DIAGNOSIS — E0865 Diabetes mellitus due to underlying condition with hyperglycemia: Secondary | ICD-10-CM | POA: Diagnosis present

## 2018-10-03 DIAGNOSIS — R509 Fever, unspecified: Secondary | ICD-10-CM

## 2018-10-03 DIAGNOSIS — D638 Anemia in other chronic diseases classified elsewhere: Secondary | ICD-10-CM | POA: Diagnosis present

## 2018-10-03 DIAGNOSIS — K5730 Diverticulosis of large intestine without perforation or abscess without bleeding: Secondary | ICD-10-CM | POA: Diagnosis not present

## 2018-10-03 DIAGNOSIS — D509 Iron deficiency anemia, unspecified: Secondary | ICD-10-CM | POA: Diagnosis present

## 2018-10-03 DIAGNOSIS — Z7982 Long term (current) use of aspirin: Secondary | ICD-10-CM

## 2018-10-03 LAB — CBC WITH DIFFERENTIAL/PLATELET
Band Neutrophils: 0 %
Basophils Absolute: 0 10*3/uL (ref 0.0–0.1)
Basophils Relative: 0 %
Blasts: 0 %
Eosinophils Absolute: 0 10*3/uL (ref 0.0–0.5)
Eosinophils Relative: 0 %
HCT: 33.9 % — ABNORMAL LOW (ref 36.0–46.0)
Hemoglobin: 10.6 g/dL — ABNORMAL LOW (ref 12.0–15.0)
Lymphocytes Relative: 5 %
Lymphs Abs: 0.8 10*3/uL (ref 0.7–4.0)
MCH: 19.2 pg — ABNORMAL LOW (ref 26.0–34.0)
MCHC: 31.3 g/dL (ref 30.0–36.0)
MCV: 61.4 fL — ABNORMAL LOW (ref 80.0–100.0)
Metamyelocytes Relative: 0 %
Monocytes Absolute: 0.8 10*3/uL (ref 0.1–1.0)
Monocytes Relative: 5 %
Myelocytes: 0 %
Neutro Abs: 14.6 10*3/uL — ABNORMAL HIGH (ref 1.7–7.7)
Neutrophils Relative %: 90 %
Other: 0 %
Platelets: 208 10*3/uL (ref 150–400)
Promyelocytes Relative: 0 %
RBC: 5.52 MIL/uL — ABNORMAL HIGH (ref 3.87–5.11)
RDW: 15.9 % — ABNORMAL HIGH (ref 11.5–15.5)
WBC: 16.2 10*3/uL — ABNORMAL HIGH (ref 4.0–10.5)
nRBC: 0 % (ref 0.0–0.2)
nRBC: 0 /100 WBC

## 2018-10-03 LAB — COMPREHENSIVE METABOLIC PANEL
ALT: 41 U/L (ref 0–44)
AST: 46 U/L — ABNORMAL HIGH (ref 15–41)
Albumin: 3.1 g/dL — ABNORMAL LOW (ref 3.5–5.0)
Alkaline Phosphatase: 85 U/L (ref 38–126)
Anion gap: 14 (ref 5–15)
BUN: 25 mg/dL — ABNORMAL HIGH (ref 8–23)
CO2: 19 mmol/L — ABNORMAL LOW (ref 22–32)
Calcium: 9.6 mg/dL (ref 8.9–10.3)
Chloride: 99 mmol/L (ref 98–111)
Creatinine, Ser: 1.02 mg/dL — ABNORMAL HIGH (ref 0.44–1.00)
GFR calc Af Amer: >60 mL/min (ref >60)
GFR calc non Af Amer: 54 mL/min — ABNORMAL LOW (ref >60)
Glucose, Bld: 143 mg/dL — ABNORMAL HIGH (ref 70–99)
Potassium: 3.9 mmol/L (ref 3.5–5.1)
Sodium: 132 mmol/L — ABNORMAL LOW (ref 135–145)
Total Bilirubin: 1.9 mg/dL — ABNORMAL HIGH (ref 0.3–1.2)
Total Protein: 7.3 g/dL (ref 6.5–8.1)

## 2018-10-03 LAB — LACTIC ACID, PLASMA: Lactic Acid, Venous: 4.1 mmol/L (ref 0.5–1.9)

## 2018-10-03 LAB — SARS CORONAVIRUS 2: SARS Coronavirus 2: NOT DETECTED

## 2018-10-03 LAB — TROPONIN I: Troponin I: 0.98 ng/mL (ref <0.03)

## 2018-10-03 LAB — PROTIME-INR
INR: 1.3 — ABNORMAL HIGH (ref 0.8–1.2)
Prothrombin Time: 15.9 seconds — ABNORMAL HIGH (ref 11.4–15.2)

## 2018-10-03 MED ORDER — SODIUM CHLORIDE 0.9 % IV BOLUS (SEPSIS)
1000.0000 mL | Freq: Once | INTRAVENOUS | Status: AC
  Administered 2018-10-04: 1000 mL via INTRAVENOUS

## 2018-10-03 MED ORDER — SODIUM CHLORIDE 0.9 % IV SOLN: 2.0000 g | Freq: Two times a day (BID) | INTRAVENOUS | Status: DC

## 2018-10-03 MED ORDER — VANCOMYCIN HCL IN DEXTROSE 1-5 GM/200ML-% IV SOLN
1000.0000 mg | Freq: Once | INTRAVENOUS | Status: DC
  Filled 2018-10-03: qty 200

## 2018-10-03 MED ORDER — SODIUM CHLORIDE 0.9 % IV SOLN
2.0000 g | Freq: Once | INTRAVENOUS | Status: AC
  Administered 2018-10-03: 2 g via INTRAVENOUS
  Filled 2018-10-03: qty 2

## 2018-10-03 MED ORDER — SODIUM CHLORIDE 0.9% FLUSH: 3.0000 mL | Freq: Once | INTRAVENOUS | Status: DC

## 2018-10-03 MED ORDER — VANCOMYCIN HCL 10 G IV SOLR
2000.0000 mg | Freq: Once | INTRAVENOUS | Status: AC
  Administered 2018-10-04: 2000 mg via INTRAVENOUS
  Filled 2018-10-03: qty 2000

## 2018-10-03 MED ORDER — HEPARIN BOLUS VIA INFUSION
4000.0000 [IU] | Freq: Once | INTRAVENOUS | Status: AC
  Administered 2018-10-04: 4000 [IU] via INTRAVENOUS
  Filled 2018-10-03: qty 4000

## 2018-10-03 MED ORDER — METRONIDAZOLE IN NACL 5-0.79 MG/ML-% IV SOLN
500.0000 mg | Freq: Once | INTRAVENOUS | Status: AC
  Administered 2018-10-03: 500 mg via INTRAVENOUS
  Filled 2018-10-03: qty 100

## 2018-10-03 MED ORDER — SODIUM CHLORIDE 0.9 % IV BOLUS (SEPSIS)
1000.0000 mL | Freq: Once | INTRAVENOUS | Status: AC
  Administered 2018-10-03: 1000 mL via INTRAVENOUS

## 2018-10-03 MED ORDER — VANCOMYCIN HCL IN DEXTROSE 1-5 GM/200ML-% IV SOLN
1000.0000 mg | Freq: Two times a day (BID) | INTRAVENOUS | Status: DC
  Administered 2018-10-04: 1000 mg via INTRAVENOUS
  Filled 2018-10-03 (×2): qty 200

## 2018-10-03 MED ORDER — HEPARIN (PORCINE) 25000 UT/250ML-% IV SOLN
1400.0000 [IU]/h | INTRAVENOUS | Status: DC
  Administered 2018-10-04: 1200 [IU]/h via INTRAVENOUS
  Administered 2018-10-04: 1000 [IU]/h via INTRAVENOUS
  Filled 2018-10-03 (×2): qty 250

## 2018-10-03 MED ORDER — ASPIRIN 81 MG PO CHEW
324.0000 mg | CHEWABLE_TABLET | Freq: Once | ORAL | Status: AC
  Administered 2018-10-04: 324 mg via ORAL
  Filled 2018-10-03: qty 4

## 2018-10-03 NOTE — ED Notes (Signed)
Patient transported to CT 

## 2018-10-03 NOTE — Progress Notes (Signed)
ANTICOAGULATION CONSULT NOTE - Initial Consult  Pharmacy Consult for heparin Indication: chest pain/ACS  Allergies  Allergen Reactions  . Statins Other (See Comments)    Muscle aches    Patient Measurements: Height: 5' 5"  (165.1 cm) Weight: 202 lb (91.6 kg) IBW/kg (Calculated) : 57 Heparin Dosing Weight: 75kg  Vital Signs: Temp: 100.9 F (38.3 C) (06/12 2126) BP: 123/53 (06/12 2126) Pulse Rate: 86 (06/12 2126)  Labs: Recent Labs    10/03/18 2136  HGB 10.6*  HCT 33.9*  PLT 208  LABPROT 15.9*  INR 1.3*  CREATININE 1.02*  TROPONINI 0.98*    Estimated Creatinine Clearance: 54.1 mL/min (A) (by C-G formula based on SCr of 1.02 mg/dL (H)).   Medical History: Past Medical History:  Diagnosis Date  . Arthritis    "back" (04/22/2018)  . CAD (coronary artery disease)    a. 03/2018 s/p PCI/DES to the RCA (3.0x15 Onyx DES).  . Carotid artery stenosis    Mild  . Chronic lower back pain   . Colon polyps   . Diverticulitis   . Diverticulosis   . Esophageal thickening    seen on pre TAVR CT scan, also questionable cirrhosis. MRI recommended. Will refer to GI  . GERD (gastroesophageal reflux disease)   . Grave's disease   . History of colonic polyps 05/22/2017  . History of hiatal hernia   . Hypertension   . Hypothyroidism   . IBS (irritable bowel syndrome)   . Osteopenia   . Pulmonary nodules    seen on pre TAVR CT. likley benign. no follow up recommended if pt low risk.  . S/P TAVR (transcatheter aortic valve replacement)   . Severe aortic stenosis   . Thalassemia minor   . Type II diabetes mellitus ([facility])     Assessment: 73yo female c/o SOB and fever, troponin found to be elevated, to begin heparin.  Goal of Therapy:  Heparin level 0.3-0.7 units/ml Monitor platelets by anticoagulation protocol: Yes   Plan:  Will give heparin 4000 units IV bolus x1 followed by gtt at 1000 units/hr and monitor heparin levels and CBC.  Wynona Neat, PharmD, BCPS   10/03/2018,11:22 PM

## 2018-10-03 NOTE — ED Provider Notes (Signed)
[facility] EMERGENCY DEPARTMENT Provider Note   CSN: [id number] Arrival date & time: 10/03/18  2120     History   Chief Complaint Chief Complaint  Patient presents with   Shortness of Breath   Fever    HPI Breanna Webster is a 73 y.o. female.     Patient past medical history notable for aortic valve replacement 06/2018, CAD, hypertension, diabetes presents to the emergency department with a chief complaint of shortness of breath.  She states that the symptoms started today.  She reports associated fever.  Per the patient's husband, she has been confused, and believes the year to be 1945.  She is oriented to person and place.  She denies any known sick contacts.  Denies any cough, chest pain, abdominal pain, or dysuria.  Denies any nausea or vomiting.  She states that her only complaint is the shortness of breath and fever.  She denies any history of PE or DVT.  The history is provided by the patient. No language interpreter was used.    Past Medical History:  Diagnosis Date   Arthritis    "back" (04/22/2018)   CAD (coronary artery disease)    a. 03/2018 s/p PCI/DES to the RCA (3.0x15 Onyx DES).   Carotid artery stenosis    Mild   Chronic lower back pain    Colon polyps    Diverticulitis    Diverticulosis    Esophageal thickening    seen on pre TAVR CT scan, also questionable cirrhosis. MRI recommended. Will refer to GI   GERD (gastroesophageal reflux disease)    Grave's disease    History of colonic polyps 05/22/2017   History of hiatal hernia    Hypertension    Hypothyroidism    IBS (irritable bowel syndrome)    Osteopenia    Pulmonary nodules    seen on pre TAVR CT. likley benign. no follow up recommended if pt low risk.   S/P TAVR (transcatheter aortic valve replacement)    Severe aortic stenosis    Thalassemia minor    Type II diabetes mellitus ([place])     Patient Active Problem List   Diagnosis Date Noted   Severe  aortic stenosis 07/08/2018   S/P TAVR (transcatheter aortic valve replacement) 07/08/2018   Esophageal thickening    CAD in native artery 04/22/2018   Gastroesophageal reflux disease    Pulmonary hypertension ([place]) 02/21/2018   Essential hypertension 07/15/2017   History of colonic polyps 05/22/2017   Elevated liver function tests 12/05/2016   Thalassemia minor 05/29/2016   Left bundle branch block 12/06/2015   Upper airway cough syndrome 10/14/2015   Myalgia 02/17/2014   Carotid artery stenosis 06/05/2013   Eustachian tube dysfunction 05/07/2013   Neuropathy of leg 03/07/2012   Anemia, unspecified 01/31/2012   Hot flashes 08/24/2010   Diabetes mellitus due to underlying condition, uncontrolled ([place]) 06/30/2010   Obesity 12/21/2009   Vitamin D deficiency 03/10/2009   Hypothyroidism 12/13/2008   Dyslipidemia 12/13/2008   Anxiety state 12/13/2008   Other specified disorders of bladder 12/13/2008    Past Surgical History:  Procedure Laterality Date   24 HOUR [place] STUDY N/A 03/03/2018   Procedure: 24 HOUR [place] STUDY;  Surgeon: Mauri Pole, MD;  Location: [facility];  Service: Endoscopy;  Laterality: N/A;   COLONOSCOPY W/ BIOPSIES AND POLYPECTOMY     CORONARY ANGIOGRAPHY Right 04/21/2018   Procedure: CORONARY ANGIOGRAPHY (CATH LAB);  Surgeon: [name], MD;  Location: [facility] INVASIVE CV  LAB;  Service: Cardiovascular;  Laterality: Right;   CORONARY STENT INTERVENTION N/A 04/22/2018   Procedure: CORONARY STENT INTERVENTION;  Surgeon: [name], MD;  Location: [place] CV LAB;  Service: Cardiovascular;  Laterality: N/A;   DILATION AND CURETTAGE OF UTERUS     ESOPHAGEAL MANOMETRY N/A 03/03/2018   Procedure: ESOPHAGEAL MANOMETRY (EM);  Surgeon: Mauri Pole, MD;  Location: [facility];  Service: Endoscopy;  Laterality: N/A;   HERNIA REPAIR     HYSTEROSCOPY     fibroids   LAPAROSCOPIC CHOLECYSTECTOMY     LAPAROSCOPY      fibroids   NISSEN FUNDOPLICATION  5003B   RIGHT/LEFT HEART CATH AND CORONARY ANGIOGRAPHY N/A 02/20/2018   Procedure: RIGHT/LEFT HEART CATH AND CORONARY ANGIOGRAPHY;  Surgeon: [name], MD;  Location: [facility] CV LAB;  Service: Cardiovascular;  Laterality: N/A;   TEE WITHOUT CARDIOVERSION N/A 07/08/2018   Procedure: TRANSESOPHAGEAL ECHOCARDIOGRAM (TEE);  Surgeon: Burnell Blanks, MD;  Location: [facility];  Service: Open Heart Surgery;  Laterality: N/A;   TONSILLECTOMY     TRANSCATHETER AORTIC VALVE REPLACEMENT, TRANSFEMORAL N/A 07/08/2018   Procedure: TRANSCATHETER AORTIC VALVE REPLACEMENT, TRANSFEMORAL;  Surgeon: Burnell Blanks, MD;  Location: [facility];  Service: Open Heart Surgery;  Laterality: N/A;     OB History    Gravida  2   Para  2   Term  2   Preterm      AB      Living        SAB      TAB      Ectopic      Multiple      Live Births               Home Medications    Prior to Admission medications   Medication Sig Start Date End Date Taking? Authorizing Provider  acetaminophen (TYLENOL) 500 MG tablet Take 1,000 mg by mouth every 6 (six) hours as needed (for pain.).    [provider]  aspirin 81 MG tablet Take 81 mg by mouth daily.      [provider]  bisoprolol (ZEBETA) 5 MG tablet TAKE 1 TABLET BY MOUTH EVERY DAY 07/04/18   Nafziger, Tommi Rumps, NP  chlorpheniramine (CHLOR-TRIMETON) 4 MG tablet Take 4 mg by mouth at bedtime.     [provider]  cholecalciferol (VITAMIN D) 1000 units tablet Take 1,000 Units by mouth daily.    [provider]  clopidogrel (PLAVIX) 75 MG tablet Take 1 tablet (75 mg total) by mouth daily with breakfast. 04/24/18   Theora Gianotti, NP  escitalopram (LEXAPRO) 20 MG tablet TAKE 1 TABLET BY MOUTH EVERY DAY 07/04/18   Nafziger, Tommi Rumps, NP  hydrochlorothiazide (HYDRODIURIL) 12.5 MG tablet TAKE 1 TABLET BY MOUTH EVERY DAY 08/27/18   Nafziger, Tommi Rumps, NP  insulin NPH Human  (NOVOLIN N RELION) 100 UNIT/ML injection Inject 1.2 mLs (120 Units total) into the skin every morning. And syringes 1/day 02/04/18   Renato Shin, MD  Insulin Syringe-Needle U-100 (BD INSULIN SYRINGE U/F) 31G X 5/16" 1 ML MISC USE TO INJECT INSULIN ONCE DAILY 03/27/18   Renato Shin, MD  losartan (COZAAR) 50 MG tablet TAKE 1 TABLET(50 MG) BY MOUTH DAILY Patient taking differently: Take 50 mg by mouth daily.  10/01/18   Nafziger, Tommi Rumps, NP  Multiple Vitamin (MULITIVITAMIN WITH MINERALS) TABS Take 1 tablet by mouth daily.    [provider]  nitroGLYCERIN (NITROSTAT) 0.4 MG SL tablet Place 1  tablet (0.4 mg total) under the tongue every 5 (five) minutes as needed for chest pain. 04/23/18   Theora Gianotti, NP  nortriptyline (PAMELOR) 25 MG capsule TAKE 1 CAPSULE(25 MG) BY MOUTH AT BEDTIME Patient taking differently: Take 25 mg by mouth at bedtime.  10/01/18   Nafziger, Tommi Rumps, NP  ONETOUCH DELICA LANCETS 95G MISC USE TO CHECK BLOOD SUGAR TWICE DAILY Patient taking differently: 2 (two) times a day.  02/26/18   Renato Shin, MD  [facility] VERIO test strip USE TO CHECK BLOOD SUGAR TWICE DAILY Patient taking differently: 1 each by Other route 2 (two) times a day.  08/23/17   Renato Shin, MD  pantoprazole (PROTONIX) 40 MG tablet Take 1 tablet (40 mg total) by mouth 2 (two) times daily. 10/01/18   [name], [name], MD  SYNTHROID 175 MCG tablet TAKE 1 TABLET BY MOUTH EVERY DAY 10/02/18   Renato Shin, MD    Family History Family History  Adopted: Yes  Problem Relation Age of Onset   Healthy Son        x 2   Headache Other        Cluster headaches   Heart failure Mother    Colon cancer Neg Hx    Pancreatic cancer Neg Hx    Rectal cancer Neg Hx    Stomach cancer Neg Hx     Social History Social History   Tobacco Use   Smoking status: Never Smoker   Smokeless tobacco: Never Used  Substance Use Topics   Alcohol use: No    Alcohol/week: 0.0 standard drinks   Drug  use: Never     Allergies   Statins   Review of Systems Review of Systems  All other systems reviewed and are negative.    Physical Exam Updated Vital Signs BP (!) 123/53    Pulse 86    Temp (!) 100.9 F (38.3 C)    Resp (!) 22    SpO2 95%   Physical Exam Vitals signs and nursing note reviewed.  Constitutional:      General: She is not in acute distress.    Appearance: She is well-developed.  HENT:     Head: Normocephalic and atraumatic.  Eyes:     Conjunctiva/sclera: Conjunctivae normal.  Neck:     Musculoskeletal: Neck supple.  Cardiovascular:     Rate and Rhythm: Normal rate and regular rhythm.     Heart sounds: No murmur.  Pulmonary:     Effort: Pulmonary effort is normal. No respiratory distress.     Breath sounds: Normal breath sounds.     Comments: Clear to auscultation bilaterally Abdominal:     Palpations: Abdomen is soft.     Tenderness: There is no abdominal tenderness.  Musculoskeletal:     Comments: Moves all extremities  Skin:    General: Skin is warm and dry.  Neurological:     Mental Status: She is alert.     Comments: Oriented to person and place, but not time  Psychiatric:        Mood and Affect: Mood normal.        Behavior: Behavior normal.      ED Treatments / Results  Labs (all labs ordered are listed, but only abnormal results are displayed) Labs Reviewed  COMPREHENSIVE METABOLIC PANEL - Abnormal; Notable for the following components:      Result Value   Sodium 132 (*)    CO2 19 (*)    Glucose, Bld 143 (*)  BUN 25 (*)    Creatinine, Ser 1.02 (*)    Albumin 3.1 (*)    AST 46 (*)    Total Bilirubin 1.9 (*)    GFR calc non Af Amer 54 (*)    All other components within normal limits  LACTIC ACID, PLASMA - Abnormal; Notable for the following components:   Lactic Acid, Venous 4.1 (*)    All other components within normal limits  LACTIC ACID, PLASMA - Abnormal; Notable for the following components:   Lactic Acid, Venous 2.4  (*)    All other components within normal limits  CBC WITH DIFFERENTIAL/PLATELET - Abnormal; Notable for the following components:   WBC 16.2 (*)    RBC 5.52 (*)    Hemoglobin 10.6 (*)    HCT 33.9 (*)    MCV 61.4 (*)    MCH 19.2 (*)    RDW 15.9 (*)    Neutro Abs 14.6 (*)    All other components within normal limits  PROTIME-INR - Abnormal; Notable for the following components:   Prothrombin Time 15.9 (*)    INR 1.3 (*)    All other components within normal limits  TROPONIN I - Abnormal; Notable for the following components:   Troponin I 0.98 (*)    All other components within normal limits  TROPONIN I - Abnormal; Notable for the following components:   Troponin I 0.91 (*)    All other components within normal limits  SARS CORONAVIRUS 2  CULTURE, BLOOD (ROUTINE X 2)  CULTURE, BLOOD (ROUTINE X 2)  URINALYSIS, ROUTINE W REFLEX MICROSCOPIC  HEPARIN LEVEL (UNFRACTIONATED)  CBC  TROPONIN I  PROTIME-INR  CORTISOL-AM, BLOOD  PROCALCITONIN  COMPREHENSIVE METABOLIC PANEL    EKG    Radiology Ct Abdomen Pelvis Wo Contrast  Result Date: 10/04/2018 CLINICAL DATA:  Sepsis EXAM: CT ABDOMEN AND PELVIS WITHOUT CONTRAST TECHNIQUE: Multidetector CT imaging of the abdomen and pelvis was performed following the standard protocol without IV contrast. COMPARISON:  06/17/2018 FINDINGS: Lower chest: No acute abnormality. Hepatobiliary: There is hepatic steatosis. The liver appears nodular in contour. The patient is status post prior cholecystectomy. Pancreas: The pancreas is atrophic and fatty replaced. Spleen: The spleen is enlarged measuring approximately 14 cm craniocaudad. Adrenals/Urinary Tract: Adrenal glands are unremarkable. Kidneys are normal, without renal calculi, focal lesion, or hydronephrosis. Bladder is unremarkable. Detection of stones is limited by the presence of IV contrast from prior contrast enhanced CT of the chest. Stomach/Bowel: There is sigmoid diverticulosis without CT  evidence of diverticulitis. The appendix is unremarkable. The stomach is unremarkable. There is no evidence of a small-bowel obstruction. Vascular/Lymphatic: Aortic atherosclerosis. No enlarged abdominal or pelvic lymph nodes. Reproductive: Uterus and bilateral adnexa are unremarkable. Other: No abdominal wall hernia or abnormality. No abdominopelvic ascites. Musculoskeletal: No acute or significant osseous findings. IMPRESSION: 1. No acute intra-abdominal abnormality detected. No findings to explain the patient's sepsis. 2. Cirrhotic appearing liver with stigmata of portal hypertension including splenomegaly. 3. Status post cholecystectomy. 4. Normal appendix in the right lower quadrant. 5. No hydronephrosis. 6.  Choose 1 Electronically Signed   By: Constance Holster M.D.   On: 10/04/2018 03:03   Ct Angio Chest Pe W And/or Wo Contrast  Result Date: 10/04/2018 CLINICAL DATA:  Shortness of breath EXAM: CT ANGIOGRAPHY CHEST WITH CONTRAST TECHNIQUE: Multidetector CT imaging of the chest was performed using the standard protocol during bolus administration of intravenous contrast. Multiplanar CT image reconstructions and MIPs were obtained to evaluate the vascular anatomy. CONTRAST:  36m OMNIPAQUE IOHEXOL 350 MG/ML SOLN COMPARISON:  CT chest dated 06/18/2018 FINDINGS: Cardiovascular: The heart size is significantly enlarged. The patient is status post TAVR. Mitral valve calcifications are noted. Coronary artery calcifications are noted. Thoracic aortic calcifications are noted. Mediastinum/Nodes: The patient appears to be status post prior thyroidectomy. There are no pathologically enlarged mediastinal or hilar lymph nodes. No enlarged axillary or supraclavicular lymph nodes. Lungs/Pleura: The lung volumes are low. There is no large pleural effusion. No pneumothorax. The trachea is unremarkable. There is presumed atelectasis at the lung bases bilaterally, left worse than right. Upper Abdomen: The liver appears  enlarged. There is likely underlying hepatic steatosis. The patient is status post prior cholecystectomy. The spleen is enlarged. Musculoskeletal: No chest wall abnormality. No acute or significant osseous findings. Review of the MIP images confirms the above findings. IMPRESSION: 1. No PE. 2. Cardiomegaly.  The patient is status post TAVR. 3. Low lung volumes. There is no pneumothorax or significant pleural effusion. 4. Probable hepatic steatosis and hepatomegaly. 5. Splenomegaly. Aortic Atherosclerosis (ICD10-I70.0). Electronically Signed   By: CConstance HolsterM.D.   On: 10/04/2018 00:16   Dg Chest Portable 1 View  Result Date: 10/03/2018 CLINICAL DATA:  Shortness of breath EXAM: PORTABLE CHEST 1 VIEW COMPARISON:  07/08/2018 FINDINGS: The heart is enlarged. The patient is status post prior TAVR. There is no pneumothorax. No large pleural effusion. No significant area of consolidation. There is no pneumothorax. No acute osseous abnormality. IMPRESSION: No active disease. Electronically Signed   By: CConstance HolsterM.D.   On: 10/03/2018 22:21    Procedures Procedures (including critical care time) CRITICAL CARE Performed by: RMontine CircleSepsis, multiple abx, fluid boluses, troponin 0.98, heparin infusion, multiple consults  Total critical care time: 48 minutes  Critical care time was exclusive of separately billable procedures and treating other patients.  Critical care was necessary to treat or prevent imminent or life-threatening deterioration.  Critical care was time spent personally by me on the following activities: development of treatment plan with patient and/or surrogate as well as nursing, discussions with consultants, evaluation of patient's response to treatment, examination of patient, obtaining history from patient or surrogate, ordering and performing treatments and interventions, ordering and review of laboratory studies, ordering and review of radiographic studies, pulse  oximetry and re-evaluation of patient's condition.  Medications Ordered in ED Medications  sodium chloride flush (NS) 0.9 % injection 3 mL (3 mLs Intravenous Not Given 10/03/18 2155)  sodium chloride 0.9 % bolus 1,000 mL (1,000 mLs Intravenous New Bag/Given 10/04/18 0253)    And  sodium chloride 0.9 % bolus 1,000 mL (0 mLs Intravenous Stopped 10/04/18 0252)    And  sodium chloride 0.9 % bolus 1,000 mL (0 mLs Intravenous Stopped 10/04/18 0252)  heparin ADULT infusion 100 units/mL (25000 units/2525msodium chloride 0.45%) (1,000 Units/hr Intravenous New Bag/Given 10/04/18 0135)  vancomycin (VANCOCIN) IVPB 1000 mg/200 mL premix (has no administration in time range)  0.9 %  sodium chloride infusion (has no administration in time range)  ceFEPIme (MAXIPIME) 2 g in sodium chloride 0.9 % 100 mL IVPB (0 g Intravenous Stopped 10/04/18 0252)  metroNIDAZOLE (FLAGYL) IVPB 500 mg (0 mg Intravenous Stopped 10/04/18 0136)  vancomycin (VANCOCIN) 2,000 mg in sodium chloride 0.9 % 500 mL IVPB (2,000 mg Intravenous New Bag/Given 10/04/18 0103)  heparin bolus via infusion 4,000 Units (4,000 Units Intravenous Bolus from Bag 10/04/18 0136)  aspirin chewable tablet 324 mg (324 mg Oral Given 10/04/18 0105)  iohexol (OMNIPAQUE) 350 MG/ML  injection 80 mL (80 mLs Intravenous Contrast Given 10/03/18 2356)     Initial Impression / Assessment and Plan / ED Course  I have reviewed the triage vital signs and the nursing notes.  Pertinent labs & imaging results that were available during my care of the patient were reviewed by me and considered in my medical decision making (see chart for details).        Patient with shortness of breath, fever, and confusion.  Symptoms started today.  No infectious symptoms on exam, however patient is noted to be febrile to 100.9 in triage.  Will check labs.  Patient meets sirs criteria.  Lactic acid is 4.1, will proceed with weight-based fluids.  Troponin is 0.98.  Coronavirus test  negative.  Chest x-ray negative.  Doubt utility of d-dimer, will proceed with CT PE study.  11:20 PM Discussed with Dr. Darl Householder, who has reviewed labs.  Recommends heparin for NSTEMI, no changes on EKG.  Recommends CT PE study.  12:49 AM CT scan is negative for PE.  No evidence of infiltrate.  Impression Dr. Raiford Simmonds from cardiology for seeing the patient due to elevated troponin of 0.98.  He believes this is secondary to the sepsis.    Consult hospitalist for admission.  1:43 AM I had a discussion with Dr. Alcario Drought regarding the patient, states that he needs urinalysis to result prior to admitting.  Urinalysis is negative, will check CT abdomen/pelvis per Dr. Alcario Drought. If infection on CT, will need lumbar puncture.  3:20 AM CT negative for infectious source.  LP not performed due to being on heparin for elevated troponin.  Discussed with Dr. Alcario Drought.  Plan to admit and treat empirically.   Appreciate Dr. Juleen China help on this case.  Final Clinical Impressions(s) / ED Diagnoses   Final diagnoses:  Fever of unknown origin  Sepsis, due to unspecified organism, unspecified whether acute organ dysfunction present [facility])    ED Discharge Orders    None       Montine Circle, PA-C 10/04/18 0321    Drenda Freeze, MD 10/04/18 [phone]

## 2018-10-03 NOTE — Progress Notes (Addendum)
Pharmacy Antibiotic Note  Breanna Webster is a 73 y.o. female admitted on 10/03/2018 with FUO/sepsis.  Pharmacy has been consulted for vancomycin/cefepime dosing. Tmax/24h 100.9, WBC pending, LA 4.1. SCr pending on admit.  Plan: Cefepime 2g IV x 1 Flagyl 538m IV x 1 per MD Vancomycin 2g IV x1 Monitor clinical progress, c/s, renal function F/u de-escalation plan/LOT, vancomycin levels as indicated   Temp (24hrs), Avg:100.9 F (38.3 C), Min:100.9 F (38.3 C), Max:100.9 F (38.3 C)  Recent Labs  Lab 10/03/18 2137  LATICACIDVEN 4.1*     Allergies  Allergen Reactions  . Statins Other (See Comments)    Muscle aches    Breanna Webster PharmD, BCPS Clinical Pharmacist 10/03/2018 10:32 PM    Addendum: SCr 1.02 >> est CrCl 54 ml/min Vancomycin 10016mIV Q12H. Goal AUC 400-550.  Expected AUC 540. Cefepime 2g IV Q12H. VeWynona NeatPharmD, BCPS  10/03/2018 11:39 PM   Addendum: Now to add acyclovir for CNS coverage >> acyclovir 60058mV Q8H. Change cefepime to Zosyn 3.375g IV Q8H (4-hour infusion). Breanna Webster 10/04/2018 3:21 AM

## 2018-10-03 NOTE — ED Triage Notes (Signed)
[name] Stockinger - Spouse - [phone]

## 2018-10-03 NOTE — ED Triage Notes (Addendum)
Pt from home for SOB and fever that started today. Per husband, pt has been confused, pt oriented to self and place, states year is "1947" at present. Denies being around any known covid patients. Pt warm to touch

## 2018-10-04 ENCOUNTER — Emergency Department (HOSPITAL_COMMUNITY): Payer: Medicare Other

## 2018-10-04 ENCOUNTER — Inpatient Hospital Stay (HOSPITAL_COMMUNITY): Payer: Medicare Other

## 2018-10-04 DIAGNOSIS — E1165 Type 2 diabetes mellitus with hyperglycemia: Secondary | ICD-10-CM | POA: Diagnosis present

## 2018-10-04 DIAGNOSIS — D638 Anemia in other chronic diseases classified elsewhere: Secondary | ICD-10-CM | POA: Diagnosis present

## 2018-10-04 DIAGNOSIS — I214 Non-ST elevation (NSTEMI) myocardial infarction: Secondary | ICD-10-CM | POA: Diagnosis not present

## 2018-10-04 DIAGNOSIS — B955 Unspecified streptococcus as the cause of diseases classified elsewhere: Secondary | ICD-10-CM | POA: Diagnosis not present

## 2018-10-04 DIAGNOSIS — Z794 Long term (current) use of insulin: Secondary | ICD-10-CM | POA: Diagnosis not present

## 2018-10-04 DIAGNOSIS — R011 Cardiac murmur, unspecified: Secondary | ICD-10-CM | POA: Diagnosis not present

## 2018-10-04 DIAGNOSIS — E0865 Diabetes mellitus due to underlying condition with hyperglycemia: Secondary | ICD-10-CM

## 2018-10-04 DIAGNOSIS — I248 Other forms of acute ischemic heart disease: Secondary | ICD-10-CM | POA: Diagnosis present

## 2018-10-04 DIAGNOSIS — E871 Hypo-osmolality and hyponatremia: Secondary | ICD-10-CM | POA: Diagnosis present

## 2018-10-04 DIAGNOSIS — A409 Streptococcal sepsis, unspecified: Secondary | ICD-10-CM | POA: Diagnosis not present

## 2018-10-04 DIAGNOSIS — Z888 Allergy status to other drugs, medicaments and biological substances status: Secondary | ICD-10-CM | POA: Diagnosis not present

## 2018-10-04 DIAGNOSIS — I339 Acute and subacute endocarditis, unspecified: Secondary | ICD-10-CM | POA: Diagnosis not present

## 2018-10-04 DIAGNOSIS — Z20828 Contact with and (suspected) exposure to other viral communicable diseases: Secondary | ICD-10-CM | POA: Diagnosis present

## 2018-10-04 DIAGNOSIS — J189 Pneumonia, unspecified organism: Secondary | ICD-10-CM

## 2018-10-04 DIAGNOSIS — I361 Nonrheumatic tricuspid (valve) insufficiency: Secondary | ICD-10-CM | POA: Diagnosis not present

## 2018-10-04 DIAGNOSIS — F329 Major depressive disorder, single episode, unspecified: Secondary | ICD-10-CM | POA: Diagnosis present

## 2018-10-04 DIAGNOSIS — Z952 Presence of prosthetic heart valve: Secondary | ICD-10-CM | POA: Diagnosis not present

## 2018-10-04 DIAGNOSIS — R410 Disorientation, unspecified: Secondary | ICD-10-CM | POA: Diagnosis not present

## 2018-10-04 DIAGNOSIS — A419 Sepsis, unspecified organism: Secondary | ICD-10-CM | POA: Diagnosis present

## 2018-10-04 DIAGNOSIS — R7881 Bacteremia: Secondary | ICD-10-CM | POA: Diagnosis not present

## 2018-10-04 DIAGNOSIS — E785 Hyperlipidemia, unspecified: Secondary | ICD-10-CM | POA: Diagnosis present

## 2018-10-04 DIAGNOSIS — I272 Pulmonary hypertension, unspecified: Secondary | ICD-10-CM

## 2018-10-04 DIAGNOSIS — K219 Gastro-esophageal reflux disease without esophagitis: Secondary | ICD-10-CM | POA: Diagnosis present

## 2018-10-04 DIAGNOSIS — I330 Acute and subacute infective endocarditis: Secondary | ICD-10-CM | POA: Diagnosis not present

## 2018-10-04 DIAGNOSIS — I340 Nonrheumatic mitral (valve) insufficiency: Secondary | ICD-10-CM | POA: Diagnosis not present

## 2018-10-04 DIAGNOSIS — Z955 Presence of coronary angioplasty implant and graft: Secondary | ICD-10-CM | POA: Diagnosis not present

## 2018-10-04 DIAGNOSIS — K5730 Diverticulosis of large intestine without perforation or abscess without bleeding: Secondary | ICD-10-CM | POA: Diagnosis not present

## 2018-10-04 DIAGNOSIS — R6520 Severe sepsis without septic shock: Secondary | ICD-10-CM | POA: Diagnosis not present

## 2018-10-04 DIAGNOSIS — G9341 Metabolic encephalopathy: Secondary | ICD-10-CM | POA: Diagnosis not present

## 2018-10-04 DIAGNOSIS — A408 Other streptococcal sepsis: Secondary | ICD-10-CM | POA: Diagnosis present

## 2018-10-04 DIAGNOSIS — R001 Bradycardia, unspecified: Secondary | ICD-10-CM | POA: Diagnosis present

## 2018-10-04 DIAGNOSIS — D509 Iron deficiency anemia, unspecified: Secondary | ICD-10-CM | POA: Diagnosis present

## 2018-10-04 DIAGNOSIS — E039 Hypothyroidism, unspecified: Secondary | ICD-10-CM | POA: Diagnosis present

## 2018-10-04 DIAGNOSIS — Z953 Presence of xenogenic heart valve: Secondary | ICD-10-CM | POA: Diagnosis not present

## 2018-10-04 DIAGNOSIS — G9340 Encephalopathy, unspecified: Secondary | ICD-10-CM | POA: Diagnosis not present

## 2018-10-04 DIAGNOSIS — I341 Nonrheumatic mitral (valve) prolapse: Secondary | ICD-10-CM | POA: Diagnosis not present

## 2018-10-04 DIAGNOSIS — B954 Other streptococcus as the cause of diseases classified elsewhere: Secondary | ICD-10-CM | POA: Diagnosis not present

## 2018-10-04 DIAGNOSIS — I10 Essential (primary) hypertension: Secondary | ICD-10-CM | POA: Diagnosis not present

## 2018-10-04 DIAGNOSIS — E876 Hypokalemia: Secondary | ICD-10-CM | POA: Diagnosis present

## 2018-10-04 DIAGNOSIS — K7460 Unspecified cirrhosis of liver: Secondary | ICD-10-CM | POA: Diagnosis not present

## 2018-10-04 DIAGNOSIS — R7989 Other specified abnormal findings of blood chemistry: Secondary | ICD-10-CM | POA: Diagnosis not present

## 2018-10-04 DIAGNOSIS — R509 Fever, unspecified: Secondary | ICD-10-CM | POA: Diagnosis not present

## 2018-10-04 DIAGNOSIS — R0602 Shortness of breath: Secondary | ICD-10-CM | POA: Diagnosis not present

## 2018-10-04 DIAGNOSIS — I2510 Atherosclerotic heart disease of native coronary artery without angina pectoris: Secondary | ICD-10-CM | POA: Diagnosis not present

## 2018-10-04 DIAGNOSIS — I447 Left bundle-branch block, unspecified: Secondary | ICD-10-CM | POA: Diagnosis present

## 2018-10-04 DIAGNOSIS — Z79890 Hormone replacement therapy (postmenopausal): Secondary | ICD-10-CM | POA: Diagnosis not present

## 2018-10-04 DIAGNOSIS — E119 Type 2 diabetes mellitus without complications: Secondary | ICD-10-CM | POA: Diagnosis not present

## 2018-10-04 LAB — BLOOD CULTURE ID PANEL (REFLEXED)

## 2018-10-04 LAB — CBC
HCT: 31 % — ABNORMAL LOW (ref 36.0–46.0)
Hemoglobin: 9.3 g/dL — ABNORMAL LOW (ref 12.0–15.0)
MCH: 18.8 pg — ABNORMAL LOW (ref 26.0–34.0)
MCHC: 30 g/dL (ref 30.0–36.0)
MCV: 62.5 fL — ABNORMAL LOW (ref 80.0–100.0)
Platelets: 167 10*3/uL (ref 150–400)
RBC: 4.96 MIL/uL (ref 3.87–5.11)
RDW: 15.8 % — ABNORMAL HIGH (ref 11.5–15.5)
WBC: 12.9 10*3/uL — ABNORMAL HIGH (ref 4.0–10.5)
nRBC: 0 % (ref 0.0–0.2)

## 2018-10-04 LAB — COMPREHENSIVE METABOLIC PANEL
ALT: 30 U/L (ref 0–44)
AST: 38 U/L (ref 15–41)
Albumin: 2.7 g/dL — ABNORMAL LOW (ref 3.5–5.0)
Alkaline Phosphatase: 78 U/L (ref 38–126)
Anion gap: 10 (ref 5–15)
BUN: 21 mg/dL (ref 8–23)
CO2: 20 mmol/L — ABNORMAL LOW (ref 22–32)
Calcium: 8.4 mg/dL — ABNORMAL LOW (ref 8.9–10.3)
Chloride: 105 mmol/L (ref 98–111)
Creatinine, Ser: 0.91 mg/dL (ref 0.44–1.00)
GFR calc Af Amer: >60 mL/min (ref >60)
GFR calc non Af Amer: >60 mL/min (ref >60)
Glucose, Bld: 147 mg/dL — ABNORMAL HIGH (ref 70–99)
Potassium: 3.6 mmol/L (ref 3.5–5.1)
Sodium: 135 mmol/L (ref 135–145)
Total Bilirubin: 1.5 mg/dL — ABNORMAL HIGH (ref 0.3–1.2)
Total Protein: 6.1 g/dL — ABNORMAL LOW (ref 6.5–8.1)

## 2018-10-04 LAB — RAPID URINE DRUG SCREEN, HOSP PERFORMED
Amphetamines: NOT DETECTED
Barbiturates: NOT DETECTED
Benzodiazepines: NOT DETECTED
Cocaine: NOT DETECTED
Opiates: NOT DETECTED
Tetrahydrocannabinol: NOT DETECTED

## 2018-10-04 LAB — CORTISOL-AM, BLOOD: Cortisol - AM: 17.3 ug/dL (ref 6.7–22.6)

## 2018-10-04 LAB — URINALYSIS, ROUTINE W REFLEX MICROSCOPIC
Bilirubin Urine: NEGATIVE
Glucose, UA: NEGATIVE mg/dL
Hgb urine dipstick: NEGATIVE
Ketones, ur: NEGATIVE mg/dL
Leukocytes,Ua: NEGATIVE
Nitrite: NEGATIVE
Protein, ur: NEGATIVE mg/dL
Specific Gravity, Urine: 1.029 (ref 1.005–1.030)
pH: 6 (ref 5.0–8.0)

## 2018-10-04 LAB — PROCALCITONIN: Procalcitonin: 5.18 ng/mL

## 2018-10-04 LAB — GLUCOSE, CAPILLARY
Glucose-Capillary: 126 mg/dL — ABNORMAL HIGH (ref 70–99)
Glucose-Capillary: 126 mg/dL — ABNORMAL HIGH (ref 70–99)
Glucose-Capillary: 125 mg/dL — ABNORMAL HIGH (ref 70–99)
Glucose-Capillary: 75 mg/dL (ref 70–99)

## 2018-10-04 LAB — TROPONIN I
Troponin I: 0.91 ng/mL (ref <0.03)
Troponin I: 0.83 ng/mL (ref <0.03)

## 2018-10-04 LAB — HEPARIN LEVEL (UNFRACTIONATED)
Heparin Unfractionated: 0.19 IU/mL — ABNORMAL LOW (ref 0.30–0.70)
Heparin Unfractionated: 0.26 IU/mL — ABNORMAL LOW (ref 0.30–0.70)

## 2018-10-04 LAB — LACTIC ACID, PLASMA: Lactic Acid, Venous: 2.4 mmol/L (ref 0.5–1.9)

## 2018-10-04 LAB — PROTIME-INR
INR: 1.5 — ABNORMAL HIGH (ref 0.8–1.2)
Prothrombin Time: 18.1 seconds — ABNORMAL HIGH (ref 11.4–15.2)

## 2018-10-04 LAB — TSH: TSH: 2.858 u[IU]/mL (ref 0.350–4.500)

## 2018-10-04 MED ORDER — ESCITALOPRAM OXALATE 20 MG PO TABS
20.0000 mg | ORAL_TABLET | Freq: Every day | ORAL | Status: DC
  Administered 2018-10-04 – 2018-10-10 (×7): 20 mg via ORAL
  Filled 2018-10-04 (×7): qty 1

## 2018-10-04 MED ORDER — BISOPROLOL FUMARATE 5 MG PO TABS
5.0000 mg | ORAL_TABLET | Freq: Every day | ORAL | Status: DC
  Administered 2018-10-04 – 2018-10-10 (×7): 5 mg via ORAL
  Filled 2018-10-04 (×7): qty 1

## 2018-10-04 MED ORDER — HEPARIN BOLUS VIA INFUSION
2000.0000 [IU] | Freq: Once | INTRAVENOUS | Status: AC
  Administered 2018-10-04: 2000 [IU] via INTRAVENOUS
  Filled 2018-10-04: qty 2000

## 2018-10-04 MED ORDER — LEVOTHYROXINE SODIUM 175 MCG PO TABS
175.0000 ug | ORAL_TABLET | Freq: Every day | ORAL | Status: DC
  Administered 2018-10-04 – 2018-10-10 (×6): 175 ug via ORAL
  Filled 2018-10-04 (×7): qty 1

## 2018-10-04 MED ORDER — ALBUTEROL SULFATE (2.5 MG/3ML) 0.083% IN NEBU
5.0000 mg | INHALATION_SOLUTION | Freq: Once | RESPIRATORY_TRACT | Status: AC
  Administered 2018-10-04: 5 mg via RESPIRATORY_TRACT
  Filled 2018-10-04: qty 6

## 2018-10-04 MED ORDER — ADULT MULTIVITAMIN W/MINERALS CH
1.0000 | ORAL_TABLET | Freq: Every day | ORAL | Status: DC
  Administered 2018-10-04 – 2018-10-10 (×7): 1 via ORAL
  Filled 2018-10-04 (×7): qty 1

## 2018-10-04 MED ORDER — SODIUM CHLORIDE 0.9 % IV SOLN
2.0000 g | INTRAVENOUS | Status: DC
  Administered 2018-10-04 – 2018-10-10 (×7): 2 g via INTRAVENOUS
  Filled 2018-10-04: qty 20
  Filled 2018-10-04 (×2): qty 2
  Filled 2018-10-04 (×5): qty 20

## 2018-10-04 MED ORDER — DEXTROSE 5 % IV SOLN
600.0000 mg | Freq: Three times a day (TID) | INTRAVENOUS | Status: DC
  Administered 2018-10-04 (×2): 600 mg via INTRAVENOUS
  Filled 2018-10-04: qty 12
  Filled 2018-10-04: qty 10
  Filled 2018-10-04: qty 12

## 2018-10-04 MED ORDER — ACETAMINOPHEN 325 MG PO TABS: 650.0000 mg | ORAL_TABLET | Freq: Four times a day (QID) | ORAL | Status: DC | PRN

## 2018-10-04 MED ORDER — GUAIFENESIN ER 600 MG PO TB12
600.0000 mg | ORAL_TABLET | Freq: Two times a day (BID) | ORAL | Status: DC
  Administered 2018-10-04 – 2018-10-10 (×13): 600 mg via ORAL
  Filled 2018-10-04 (×13): qty 1

## 2018-10-04 MED ORDER — INSULIN ASPART 100 UNIT/ML ~~LOC~~ SOLN
0.0000 [IU] | Freq: Three times a day (TID) | SUBCUTANEOUS | Status: DC
  Administered 2018-10-04 (×3): 3 [IU] via SUBCUTANEOUS

## 2018-10-04 MED ORDER — INSULIN GLARGINE 100 UNIT/ML ~~LOC~~ SOLN
40.0000 [IU] | Freq: Every day | SUBCUTANEOUS | Status: DC
  Administered 2018-10-04 – 2018-10-10 (×7): 40 [IU] via SUBCUTANEOUS
  Filled 2018-10-04 (×8): qty 0.4

## 2018-10-04 MED ORDER — IPRATROPIUM-ALBUTEROL 0.5-2.5 (3) MG/3ML IN SOLN: 3.0000 mL | RESPIRATORY_TRACT | Status: DC | PRN

## 2018-10-04 MED ORDER — PANTOPRAZOLE SODIUM 40 MG PO TBEC
40.0000 mg | DELAYED_RELEASE_TABLET | Freq: Two times a day (BID) | ORAL | Status: DC
  Administered 2018-10-04 – 2018-10-10 (×13): 40 mg via ORAL
  Filled 2018-10-04 (×13): qty 1

## 2018-10-04 MED ORDER — ASPIRIN 81 MG PO CHEW
81.0000 mg | CHEWABLE_TABLET | Freq: Every day | ORAL | Status: DC
  Administered 2018-10-04 – 2018-10-10 (×7): 81 mg via ORAL
  Filled 2018-10-04 (×7): qty 1

## 2018-10-04 MED ORDER — INSULIN ASPART 100 UNIT/ML ~~LOC~~ SOLN
6.0000 [IU] | Freq: Three times a day (TID) | SUBCUTANEOUS | Status: DC
  Administered 2018-10-04 – 2018-10-10 (×10): 6 [IU] via SUBCUTANEOUS

## 2018-10-04 MED ORDER — DOXYCYCLINE HYCLATE 100 MG PO TABS
100.0000 mg | ORAL_TABLET | Freq: Two times a day (BID) | ORAL | Status: DC
  Administered 2018-10-04: 100 mg via ORAL
  Filled 2018-10-04 (×2): qty 1

## 2018-10-04 MED ORDER — IOHEXOL 350 MG/ML SOLN
80.0000 mL | Freq: Once | INTRAVENOUS | Status: AC | PRN
  Administered 2018-10-03: 80 mL via INTRAVENOUS

## 2018-10-04 MED ORDER — ACETAMINOPHEN 650 MG RE SUPP: 650.0000 mg | RECTAL | Status: DC | PRN

## 2018-10-04 MED ORDER — PIPERACILLIN-TAZOBACTAM 3.375 G IVPB
3.3750 g | Freq: Three times a day (TID) | INTRAVENOUS | Status: DC
  Administered 2018-10-04 (×2): 3.375 g via INTRAVENOUS
  Filled 2018-10-04 (×2): qty 50

## 2018-10-04 MED ORDER — CLOPIDOGREL BISULFATE 75 MG PO TABS
75.0000 mg | ORAL_TABLET | Freq: Every day | ORAL | Status: DC
  Administered 2018-10-04 – 2018-10-10 (×7): 75 mg via ORAL
  Filled 2018-10-04 (×7): qty 1

## 2018-10-04 MED ORDER — SODIUM CHLORIDE 0.9 % IV SOLN
INTRAVENOUS | Status: DC
  Administered 2018-10-04 – 2018-10-05 (×2): via INTRAVENOUS

## 2018-10-04 MED ORDER — NORTRIPTYLINE HCL 25 MG PO CAPS
25.0000 mg | ORAL_CAPSULE | Freq: Every day | ORAL | Status: DC
  Administered 2018-10-04 – 2018-10-09 (×6): 25 mg via ORAL
  Filled 2018-10-04 (×7): qty 1

## 2018-10-04 NOTE — ED Notes (Signed)
Nurse will collect labs.

## 2018-10-04 NOTE — H&P (Signed)
History and Physical    Breanna Webster KVQ:[id number] DOB: 1945-12-06 DOA: 10/03/2018  PCP: [name], NP  Patient coming from: Home  I have personally briefly reviewed patient's old medical records in [place]  Chief Complaint: AMS  HPI: Breanna Webster is a 73 y.o. female with medical history significant of TAVR 06/2018, CAD, HTN, DM2.  Patient presents to the ED with c/o SOB, AMS.  Symptoms onset earlier today.  Associated fever.  Per husband: Confusion, believes year to be 49 (which would be her birth year, incidentally).  Oriented to person and place, not situation or date.  Denies cough, CP, abd pain, dysuria, headache, N/V.  Only complaints are SOB and fever.   ED Course: Trop 0.98, lactate 4.1, WBC 16.2k, Tm 100.9, RR 31, Satting 93% on 2L.  CXR neg  CTA chest PE neg  COVID neg  UA neg  CT abd/pel w/o contrast neg  No rash or skin changes (broken toenail but nothing that looks infected).  Given empiric cefepime, vanc. 4L IVF bolus, Lactate now down to 2.4.  Started on heparin gtt.  Cards consulted and hospitalist asked to admit.   Review of Systems: As per HPI otherwise 10 point review of systems negative.   Past Medical History:  Diagnosis Date   Arthritis    "back" (04/22/2018)   CAD (coronary artery disease)    a. 03/2018 s/p PCI/DES to the RCA (3.0x15 Onyx DES).   Carotid artery stenosis    Mild   Chronic lower back pain    Colon polyps    Diverticulitis    Diverticulosis    Esophageal thickening    seen on pre TAVR CT scan, also questionable cirrhosis. MRI recommended. Will refer to GI   GERD (gastroesophageal reflux disease)    Grave's disease    History of colonic polyps 05/22/2017   History of hiatal hernia    Hypertension    Hypothyroidism    IBS (irritable bowel syndrome)    Osteopenia    Pulmonary nodules    seen on pre TAVR CT. likley benign. no follow up recommended if pt low risk.   S/P TAVR  (transcatheter aortic valve replacement)    Severe aortic stenosis    Thalassemia minor    Type II diabetes mellitus ([place])     Past Surgical History:  Procedure Laterality Date   65 HOUR [place] STUDY N/A 03/03/2018   Procedure: 24 HOUR PH STUDY;  Surgeon: Mauri Pole, MD;  Location: [facility];  Service: Endoscopy;  Laterality: N/A;   COLONOSCOPY W/ BIOPSIES AND POLYPECTOMY     CORONARY ANGIOGRAPHY Right 04/21/2018   Procedure: CORONARY ANGIOGRAPHY (CATH LAB);  Surgeon: [name], MD;  Location: [place] [facility];  Service: Cardiovascular;  Laterality: Right;   CORONARY STENT INTERVENTION N/A 04/22/2018   Procedure: CORONARY STENT INTERVENTION;  Surgeon: [name], MD;  Location: [place] [facility];  Service: Cardiovascular;  Laterality: N/A;   DILATION AND CURETTAGE OF UTERUS     ESOPHAGEAL MANOMETRY N/A 03/03/2018   Procedure: ESOPHAGEAL MANOMETRY (EM);  Surgeon: Mauri Pole, MD;  Location: [facility];  Service: Endoscopy;  Laterality: N/A;   HERNIA REPAIR     HYSTEROSCOPY     fibroids   LAPAROSCOPIC CHOLECYSTECTOMY     LAPAROSCOPY     fibroids   NISSEN FUNDOPLICATION  [id number]   RIGHT/LEFT HEART CATH AND CORONARY ANGIOGRAPHY N/A 02/20/2018   Procedure: RIGHT/LEFT HEART CATH AND CORONARY ANGIOGRAPHY;  Surgeon:  [name], MD;  Location: [place] [facility];  Service: Cardiovascular;  Laterality: N/A;   TEE WITHOUT CARDIOVERSION N/A 07/08/2018   Procedure: TRANSESOPHAGEAL ECHOCARDIOGRAM (TEE);  Surgeon: Burnell Blanks, MD;  Location: [place];  Service: Open Heart Surgery;  Laterality: N/A;   TONSILLECTOMY     TRANSCATHETER AORTIC VALVE REPLACEMENT, TRANSFEMORAL N/A 07/08/2018   Procedure: TRANSCATHETER AORTIC VALVE REPLACEMENT, TRANSFEMORAL;  Surgeon: Burnell Blanks, MD;  Location: [facility];  Service: Open Heart Surgery;  Laterality: N/A;     reports that she has never smoked. She has never used smokeless tobacco. She reports  that she does not drink alcohol or use drugs.  Allergies  Allergen Reactions   Statins Other (See Comments)    Muscle aches    Family History  Adopted: Yes  Problem Relation Age of Onset   Healthy Son        x 2   Headache Other        Cluster headaches   Heart failure Mother    Colon cancer Neg Hx    Pancreatic cancer Neg Hx    Rectal cancer Neg Hx    Stomach cancer Neg Hx      Prior to Admission medications   Medication Sig Start Date End Date Taking? Authorizing Provider  acetaminophen (TYLENOL) 500 MG tablet Take 1,000 mg by mouth every 6 (six) hours as needed (for pain.).    [provider]  aspirin 81 MG tablet Take 81 mg by mouth daily.      [provider]  bisoprolol (ZEBETA) 5 MG tablet TAKE 1 TABLET BY MOUTH EVERY DAY 07/04/18   Nafziger, Tommi Rumps, NP  chlorpheniramine (CHLOR-TRIMETON) 4 MG tablet Take 4 mg by mouth at bedtime.     [provider]  cholecalciferol (VITAMIN D) 1000 units tablet Take 1,000 Units by mouth daily.    [provider]  clopidogrel (PLAVIX) 75 MG tablet Take 1 tablet (75 mg total) by mouth daily with breakfast. 04/24/18   Theora Gianotti, NP  escitalopram (LEXAPRO) 20 MG tablet TAKE 1 TABLET BY MOUTH EVERY DAY 07/04/18   Nafziger, Tommi Rumps, NP  hydrochlorothiazide (HYDRODIURIL) 12.5 MG tablet TAKE 1 TABLET BY MOUTH EVERY DAY 08/27/18   Nafziger, Tommi Rumps, NP  insulin NPH Human (NOVOLIN N RELION) 100 UNIT/ML injection Inject 1.2 mLs (120 Units total) into the skin every morning. And syringes 1/day 02/04/18   Renato Shin, MD  Insulin Syringe-Needle U-100 (BD INSULIN SYRINGE U/F) 31G X 5/16" 1 ML MISC USE TO INJECT INSULIN ONCE DAILY 03/27/18   Renato Shin, MD  losartan (COZAAR) 50 MG tablet TAKE 1 TABLET(50 MG) BY MOUTH DAILY Patient taking differently: Take 50 mg by mouth daily.  10/01/18   Nafziger, Tommi Rumps, NP  Multiple Vitamin (MULITIVITAMIN WITH MINERALS) TABS Take 1 tablet by mouth daily.    [provider]  nitroGLYCERIN (NITROSTAT) 0.4 MG SL tablet Place 1 tablet (0.4 mg total) under the tongue every 5 (five) minutes as needed for chest pain. 04/23/18   Theora Gianotti, NP  nortriptyline (PAMELOR) 25 MG capsule TAKE 1 CAPSULE(25 MG) BY MOUTH AT BEDTIME Patient taking differently: Take 25 mg by mouth at bedtime.  10/01/18   Nafziger, Tommi Rumps, NP  ONETOUCH DELICA LANCETS 75I MISC USE TO CHECK BLOOD SUGAR TWICE DAILY Patient taking differently: 2 (two) times a day.  02/26/18   Renato Shin, MD  [facility] VERIO test strip USE TO CHECK BLOOD SUGAR TWICE DAILY Patient taking differently: 1  each by Other route 2 (two) times a day.  08/23/17   Renato Shin, MD  pantoprazole (PROTONIX) 40 MG tablet Take 1 tablet (40 mg total) by mouth 2 (two) times daily. 10/01/18   [name], [name], MD  SYNTHROID 175 MCG tablet TAKE 1 TABLET BY MOUTH EVERY DAY 10/02/18   Renato Shin, MD    Physical Exam: Vitals:   10/03/18 2215 10/03/18 2230 10/03/18 2245 10/03/18 2300  BP: (!) 107/41 (!) 115/53 (!) 120/56 (!) 132/95  Pulse: 85 83 83 88  Resp: 14 (!) 21 (!) 25 (!) 23  Temp:      SpO2: 95% 91% 91% 94%  Weight:    91.6 kg  Height:    5' 5"  (1.651 m)    Constitutional: NAD, calm, comfortable Eyes: PERRL, lids and conjunctivae normal ENMT: Mucous membranes are moist. Posterior pharynx clear of any exudate or lesions.Normal dentition.  Neck: normal, supple, no masses, no thyromegaly Respiratory: CTA B but with tachypnea  Cardiovascular: Regular rate and rhythm, no murmurs / rubs / gallops. No extremity edema. 2+ pedal pulses. No carotid bruits.  Abdomen: no tenderness, no masses palpated. No hepatosplenomegaly. Bowel sounds positive.  Musculoskeletal: no clubbing / cyanosis. No joint deformity upper and lower extremities. Good ROM, no contractures. Normal muscle tone.  Skin: Broken toe nail, but nothing that looks infected Neurologic: CN 2-12 grossly intact. Sensation intact, DTR normal.  Strength 5/5 in all 4.  Psychiatric: Confused, oriented to person and location, not time nor situation.   Labs on Admission: I have personally reviewed following labs and imaging studies  CBC: Recent Labs  Lab 10/03/18 2136  WBC 16.2*  NEUTROABS 14.6*  HGB 10.6*  HCT 33.9*  MCV 61.4*  PLT 338   Basic Metabolic Panel: Recent Labs  Lab 10/03/18 2136  NA 132*  K 3.9  CL 99  CO2 19*  GLUCOSE 143*  BUN 25*  CREATININE 1.02*  CALCIUM 9.6   GFR: Estimated Creatinine Clearance: 54.1 mL/min (A) (by C-G formula based on SCr of 1.02 mg/dL (H)). Liver Function Tests: Recent Labs  Lab 10/03/18 2136  AST 46*  ALT 41  ALKPHOS 85  BILITOT 1.9*  PROT 7.3  ALBUMIN 3.1*   No results for input(s): LIPASE, AMYLASE in the last 168 hours. No results for input(s): AMMONIA in the last 168 hours. Coagulation Profile: Recent Labs  Lab 10/03/18 2136  INR 1.3*   Cardiac Enzymes: Recent Labs  Lab 10/03/18 2136 10/04/18 0100  TROPONINI 0.98* 0.91*   BNP (last 3 results) Recent Labs    02/19/18 1233  PROBNP 270   HbA1C: No results for input(s): HGBA1C in the last 72 hours. CBG: No results for input(s): GLUCAP in the last 168 hours. Lipid Profile: No results for input(s): CHOL, HDL, LDLCALC, TRIG, CHOLHDL, LDLDIRECT in the last 72 hours. Thyroid Function Tests: No results for input(s): TSH, T4TOTAL, FREET4, T3FREE, THYROIDAB in the last 72 hours. Anemia Panel: No results for input(s): VITAMINB12, FOLATE, FERRITIN, TIBC, IRON, RETICCTPCT in the last 72 hours. Urine analysis:    Component Value Date/Time   COLORURINE YELLOW 10/04/2018 0112   APPEARANCEUR CLEAR 10/04/2018 0112   LABSPEC 1.029 10/04/2018 0112   PHURINE 6.0 10/04/2018 0112   GLUCOSEU NEGATIVE 10/04/2018 0112   HGBUR NEGATIVE 10/04/2018 0112   HGBUR negative 12/13/2008 1035   BILIRUBINUR NEGATIVE 10/04/2018 0112   BILIRUBINUR neg 04/10/2018 1519   KETONESUR NEGATIVE 10/04/2018 0112   PROTEINUR NEGATIVE  10/04/2018 0112   UROBILINOGEN 0.2  04/10/2018 1519   UROBILINOGEN 0.2 09/18/2011 2049   NITRITE NEGATIVE 10/04/2018 0112   LEUKOCYTESUR NEGATIVE 10/04/2018 0112    Radiological Exams on Admission: Ct Abdomen Pelvis Wo Contrast  Result Date: 10/04/2018 CLINICAL DATA:  Sepsis EXAM: CT ABDOMEN AND PELVIS WITHOUT CONTRAST TECHNIQUE: Multidetector CT imaging of the abdomen and pelvis was performed following the standard protocol without IV contrast. COMPARISON:  06/17/2018 FINDINGS: Lower chest: No acute abnormality. Hepatobiliary: There is hepatic steatosis. The liver appears nodular in contour. The patient is status post prior cholecystectomy. Pancreas: The pancreas is atrophic and fatty replaced. Spleen: The spleen is enlarged measuring approximately 14 cm craniocaudad. Adrenals/Urinary Tract: Adrenal glands are unremarkable. Kidneys are normal, without renal calculi, focal lesion, or hydronephrosis. Bladder is unremarkable. Detection of stones is limited by the presence of IV contrast from prior contrast enhanced CT of the chest. Stomach/Bowel: There is sigmoid diverticulosis without CT evidence of diverticulitis. The appendix is unremarkable. The stomach is unremarkable. There is no evidence of a small-bowel obstruction. Vascular/Lymphatic: Aortic atherosclerosis. No enlarged abdominal or pelvic lymph nodes. Reproductive: Uterus and bilateral adnexa are unremarkable. Other: No abdominal wall hernia or abnormality. No abdominopelvic ascites. Musculoskeletal: No acute or significant osseous findings. IMPRESSION: 1. No acute intra-abdominal abnormality detected. No findings to explain the patient's sepsis. 2. Cirrhotic appearing liver with stigmata of portal hypertension including splenomegaly. 3. Status post cholecystectomy. 4. Normal appendix in the right lower quadrant. 5. No hydronephrosis. 6.  Choose 1 Electronically Signed   By: Constance Holster M.D.   On: 10/04/2018 03:03   Ct Angio Chest Pe W  And/or Wo Contrast  Result Date: 10/04/2018 CLINICAL DATA:  Shortness of breath EXAM: CT ANGIOGRAPHY CHEST WITH CONTRAST TECHNIQUE: Multidetector CT imaging of the chest was performed using the standard protocol during bolus administration of intravenous contrast. Multiplanar CT image reconstructions and MIPs were obtained to evaluate the vascular anatomy. CONTRAST:  76m OMNIPAQUE IOHEXOL 350 MG/ML SOLN COMPARISON:  CT chest dated 06/18/2018 FINDINGS: Cardiovascular: The heart size is significantly enlarged. The patient is status post TAVR. Mitral valve calcifications are noted. Coronary artery calcifications are noted. Thoracic aortic calcifications are noted. Mediastinum/Nodes: The patient appears to be status post prior thyroidectomy. There are no pathologically enlarged mediastinal or hilar lymph nodes. No enlarged axillary or supraclavicular lymph nodes. Lungs/Pleura: The lung volumes are low. There is no large pleural effusion. No pneumothorax. The trachea is unremarkable. There is presumed atelectasis at the lung bases bilaterally, left worse than right. Upper Abdomen: The liver appears enlarged. There is likely underlying hepatic steatosis. The patient is status post prior cholecystectomy. The spleen is enlarged. Musculoskeletal: No chest wall abnormality. No acute or significant osseous findings. Review of the MIP images confirms the above findings. IMPRESSION: 1. No PE. 2. Cardiomegaly.  The patient is status post TAVR. 3. Low lung volumes. There is no pneumothorax or significant pleural effusion. 4. Probable hepatic steatosis and hepatomegaly. 5. Splenomegaly. Aortic Atherosclerosis (ICD10-I70.0). Electronically Signed   By: CConstance HolsterM.D.   On: 10/04/2018 00:16   Dg Chest Portable 1 View  Result Date: 10/03/2018 CLINICAL DATA:  Shortness of breath EXAM: PORTABLE CHEST 1 VIEW COMPARISON:  07/08/2018 FINDINGS: The heart is enlarged. The patient is status post prior TAVR. There is no  pneumothorax. No large pleural effusion. No significant area of consolidation. There is no pneumothorax. No acute osseous abnormality. IMPRESSION: No active disease. Electronically Signed   By: CConstance HolsterM.D.   On: 10/03/2018 22:21    EKG:  Independently reviewed.  Assessment/Plan Principal Problem:   Sepsis ([place]) Active Problems:   Diabetes mellitus due to underlying condition, uncontrolled ([place])   Essential hypertension   Pulmonary hypertension (HCC)   CAD in native artery   S/P TAVR (transcatheter aortic valve replacement)   Acute metabolic encephalopathy    1. Sepsis - unknown source 1. Fairly extensive work up is negative thus far for an apparent source 2. BCx pending 3. Unable to obtain LP at this time since she is on heparin gtt 4. None the less we are covering for CNS empirically for the moment with: zosyn, vanc, and acyclovir 5. IVF: 4L bolus, and 100 cc/hr for now 6. Repeat CBC/BMP in AM 7. AM cortisol 2. Acute encephalopathy - Likely delirium secondary to #1 1. Will go ahead and get CT head for her encephalopathy 1. Consider MRI brain later today if persists as next step 3. Elevated troponin - suspected to be demand ischemia 1. Serial trops (second one is back and stable at 0.91) 2. See cards consult: 1. Continue metoprolol 2. Hold losartan 3. Continue ASA/Plavix 4. Continue heparin gtt 3. Tele monitor 4. HTN - 1. Continue metoprolol 2. Holding losartan and HCTZ 5. DM2 - 1. Looks like she just takes NPH (chart says 120u once a day in AM, patient says 110u once a day in AM) due to cost. 2. Will put patient on Lantus 40u in AM 3. + resistant scale SSI AC 4. + 6u mealtimes 5. With CBG checks AC/HS/0300  DVT prophylaxis: Heparin gtt Code Status: Full Family Communication: No family in room Disposition Plan: Home after admit Consults called: Cards Admission status: Admit to inpatient  Severity of Illness: The appropriate patient status for this  patient is INPATIENT. Inpatient status is judged to be reasonable and necessary in order to provide the required intensity of service to ensure the patient's safety. The patient's presenting symptoms, physical exam findings, and initial radiographic and laboratory data in the context of their chronic comorbidities is felt to place them at high risk for further clinical deterioration. Furthermore, it is not anticipated that the patient will be medically stable for discharge from the hospital within 2 midnights of admission. The following factors support the patient status of inpatient.   IP status for treatment of sepsis with end organ dysfunction (acute encephalopathy, troponin elevation).   * I certify that at the point of admission it is my clinical judgment that the patient will require inpatient hospital care spanning beyond 2 midnights from the point of admission due to high intensity of service, high risk for further deterioration and high frequency of surveillance required.*    Pierre Cumpton M. DO [facility]  How to contact the [facility] Attending or Consulting provider [place] or covering provider during after hours [place], for this patient?  1. Check the care team in [facility] and look for a) attending/consulting [facility] provider listed and b) the [facility] team listed 2. Log into [email or web address]  Amion Physician Scheduling and messaging for groups and whole hospitals  On call and physician scheduling software for group practices, residents, hospitalists and other medical providers for call, clinic, rotation and shift schedules. On[employer] is a hospital-wide system for scheduling doctors and paging doctors on call. EasyPlot is for scientific plotting and data analysis.  [email or web address]  and use [place]'s universal password to access. If you do not have the password, please contact the hospital operator.  3. Locate the [facility] provider you are looking for under  [facility] and page to a  number that you can be directly reached. 4. If you still have difficulty reaching the provider, please page the [facility] (Director on Call) for the Hospitalists listed on amion for assistance.  10/04/2018, 3:45 AM

## 2018-10-04 NOTE — ED Notes (Signed)
Husband- [phone]

## 2018-10-04 NOTE — Progress Notes (Signed)
PHARMACY - PHYSICIAN COMMUNICATION CRITICAL VALUE ALERT - BLOOD CULTURE IDENTIFICATION (BCID)  Breanna Webster is an 73 y.o. female who presented to [facility] on 10/03/2018 with a chief complaint of sepsis  Assessment:  2/3 BC positive for Strep  Name of physician (or Provider) Contacted: L [place]  Current antibiotics: vanc, zosyn, doxy  Changes to prescribed antibiotics recommended:  Change to rocephin 2gm IV q24 hours  Results for orders placed or performed during the hospital encounter of 10/03/18  Blood Culture ID Panel (Reflexed) (Collected: 10/03/2018  9:40 PM)  Result Value Ref Range   Enterococcus species NOT DETECTED NOT DETECTED   Listeria monocytogenes NOT DETECTED NOT DETECTED   Staphylococcus species NOT DETECTED NOT DETECTED   Staphylococcus aureus (BCID) NOT DETECTED NOT DETECTED   Streptococcus species DETECTED (A) NOT DETECTED   Streptococcus agalactiae NOT DETECTED NOT DETECTED   Streptococcus pneumoniae NOT DETECTED NOT DETECTED   Streptococcus pyogenes NOT DETECTED NOT DETECTED   Acinetobacter baumannii NOT DETECTED NOT DETECTED   Enterobacteriaceae species NOT DETECTED NOT DETECTED   Enterobacter cloacae complex NOT DETECTED NOT DETECTED   Escherichia coli NOT DETECTED NOT DETECTED   Klebsiella oxytoca NOT DETECTED NOT DETECTED   Klebsiella pneumoniae NOT DETECTED NOT DETECTED   Proteus species NOT DETECTED NOT DETECTED   Serratia marcescens NOT DETECTED NOT DETECTED   Haemophilus influenzae NOT DETECTED NOT DETECTED   Neisseria meningitidis NOT DETECTED NOT DETECTED   Pseudomonas aeruginosa NOT DETECTED NOT DETECTED   Candida albicans NOT DETECTED NOT DETECTED   Candida glabrata NOT DETECTED NOT DETECTED   Candida krusei NOT DETECTED NOT DETECTED   Candida parapsilosis NOT DETECTED NOT DETECTED   Candida tropicalis NOT DETECTED NOT DETECTED    Excell Seltzer Poteet 10/04/2018  10:20 PM

## 2018-10-04 NOTE — ED Notes (Signed)
Husband- richard, would like an update as soon as possible

## 2018-10-04 NOTE — Plan of Care (Signed)
Care plan progressing

## 2018-10-04 NOTE — Progress Notes (Signed)
ANTICOAGULATION CONSULT NOTE - Follow Up Consult  Pharmacy Consult for heparin Indication: chest pain/ACS  Allergies  Allergen Reactions  . Statins Other (See Comments)    Muscle aches    Patient Measurements: Height: 5' 5"  (165.1 cm) Weight: 204 lb 6.4 oz (92.7 kg) IBW/kg (Calculated) : 57 Heparin Dosing Weight: 77.4 kg  Vital Signs: Temp: 97.5 F (36.4 C) (06/13 0728) Temp Source: Oral (06/13 0728) BP: 128/55 (06/13 0728) Pulse Rate: 79 (06/13 0728)  Labs: Recent Labs    10/03/18 2136 10/04/18 0100 10/04/18 0440 10/04/18 0750  HGB 10.6*  --   --   --   HCT 33.9*  --   --   --   PLT 208  --   --   --   LABPROT 15.9*  --   --  18.1*  INR 1.3*  --   --  1.5*  HEPARINUNFRC  --   --   --  0.19*  CREATININE 1.02*  --   --   --   TROPONINI 0.98* 0.91* 0.83*  --     Estimated Creatinine Clearance: 54.5 mL/min (A) (by C-G formula based on SCr of 1.02 mg/dL (H)).   Assessment: 73yo female c/o SOB and fever, troponin found to be elevated. Pharmacy consulted for heparin. Patient not on anticoagulation PTA.  Initial heparin level subtherapeutic this AM at 0.19 on heparin 1000 units/hr. CBC shows Hgb 9.3 and pltc 167. No bleeding or issues with heparin infusion per RN.  Goal of Therapy:  Heparin level 0.3-0.7 units/ml Monitor platelets by anticoagulation protocol: Yes   Plan:  Give 2000 units bolus x 1 Increase heparin infusion at 1200 units/hr Check anti-Xa level in 8 hours and daily while on heparin Continue to monitor H&H and platelets  Thank you for allowing pharmacy to be a part of this patient's care.  Leron Croak, PharmD PGY1 Pharmacy Resident Phone: [phone]  Please check AMION for all [place] phone numbers 10/04/2018,9:18 AM

## 2018-10-04 NOTE — ED Notes (Signed)
ED TO INPATIENT HANDOFF REPORT  ED Nurse Name and Phone #: [phone] Anette Guarneri Name/Age/Gender Breanna Webster 73 y.o. female Room/Bed: 027C/027C  Code Status   Code Status: Full Code  Home/SNF/Other Home Patient oriented to: self and place Is this baseline? No   Triage Complete: Triage complete  Chief Complaint Fever,SOB  Triage Note Pt from home for SOB and fever that started today. Per husband, pt has been confused, pt oriented to self and place, states year is "1947" at present. Denies being around any known covid patients. Pt warm to touch  Stanley Helmuth - Spouse - [phone]   Allergies Allergies  Allergen Reactions  . Statins Other (See Comments)    Muscle aches    Level of Care/Admitting Diagnosis ED Disposition    ED Disposition Condition [facility]: [place] [[id number]]  Level of Care: Progressive [102]  Covid Evaluation: Confirmed COVID Negative  Diagnosis: Sepsis [facility]) [[id number]]  Admitting Physician: Doreatha Massed  Attending Physician: Etta Quill [phone]  Estimated length of stay: past midnight tomorrow  Certification:: I certify this patient will need inpatient services for at least 2 midnights  PT Class (Do Not Modify): Inpatient [101]  PT Acc Code (Do Not Modify): Private [1]       B Medical/Surgery History Past Medical History:  Diagnosis Date  . Arthritis    "back" (04/22/2018)  . CAD (coronary artery disease)    a. 03/2018 s/p PCI/DES to the RCA (3.0x15 Onyx DES).  . Carotid artery stenosis    Mild  . Chronic lower back pain   . Colon polyps   . Diverticulitis   . Diverticulosis   . Esophageal thickening    seen on pre TAVR CT scan, also questionable cirrhosis. MRI recommended. Will refer to GI  . GERD (gastroesophageal reflux disease)   . Grave's disease   . History of colonic polyps 05/22/2017  . History of hiatal hernia   . Hypertension   . Hypothyroidism   . IBS  (irritable bowel syndrome)   . Osteopenia   . Pulmonary nodules    seen on pre TAVR CT. likley benign. no follow up recommended if pt low risk.  . S/P TAVR (transcatheter aortic valve replacement)   . Severe aortic stenosis   . Thalassemia minor   . Type II diabetes mellitus ([place])    Past Surgical History:  Procedure Laterality Date  . [place] STUDY N/A 03/03/2018   Procedure: [place] STUDY;  Surgeon: Mauri Pole, MD;  Location: [facility];  Service: Endoscopy;  Laterality: N/A;  . COLONOSCOPY W/ BIOPSIES AND POLYPECTOMY    . CORONARY ANGIOGRAPHY Right 04/21/2018   Procedure: CORONARY ANGIOGRAPHY (CATH LAB);  Surgeon: [name], MD;  Location: [place] CV LAB;  Service: Cardiovascular;  Laterality: Right;  . CORONARY STENT INTERVENTION N/A 04/22/2018   Procedure: CORONARY STENT INTERVENTION;  Surgeon: [name], MD;  Location: [place] CV LAB;  Service: Cardiovascular;  Laterality: N/A;  . DILATION AND CURETTAGE OF UTERUS    . ESOPHAGEAL MANOMETRY N/A 03/03/2018   Procedure: ESOPHAGEAL MANOMETRY (EM);  Surgeon: Mauri Pole, MD;  Location: [facility];  Service: Endoscopy;  Laterality: N/A;  . HERNIA REPAIR    . HYSTEROSCOPY     fibroids  . LAPAROSCOPIC CHOLECYSTECTOMY    . LAPAROSCOPY     fibroids  . NISSEN FUNDOPLICATION  6754G  . RIGHT/LEFT HEART CATH AND  CORONARY ANGIOGRAPHY N/A 02/20/2018   Procedure: RIGHT/LEFT HEART CATH AND CORONARY ANGIOGRAPHY;  Surgeon: [name], MD;  Location: [place] CV LAB;  Service: Cardiovascular;  Laterality: N/A;  . TEE WITHOUT CARDIOVERSION N/A 07/08/2018   Procedure: TRANSESOPHAGEAL ECHOCARDIOGRAM (TEE);  Surgeon: Burnell Blanks, MD;  Location: [place];  Service: Open Heart Surgery;  Laterality: N/A;  . TONSILLECTOMY    . TRANSCATHETER AORTIC VALVE REPLACEMENT, TRANSFEMORAL N/A 07/08/2018   Procedure: TRANSCATHETER AORTIC VALVE REPLACEMENT, TRANSFEMORAL;  Surgeon: Burnell Blanks, MD;   Location: [place];  Service: Open Heart Surgery;  Laterality: N/A;     A IV Location/Drains/Wounds Patient Lines/Drains/Airways Status   Active Line/Drains/Airways    Name:   Placement date:   Placement time:   Site:   Days:   Peripheral IV 10/03/18 Left Forearm   10/03/18    2230    Forearm   1   Peripheral IV 10/04/18 Right Forearm   10/04/18    0131    Forearm   less than 1          Intake/Output Last 24 hours No intake or output data in the 24 hours ending 10/04/18 6433  Labs/Imaging Results for orders placed or performed during the hospital encounter of 10/03/18 (from the past 48 hour(s))  Comprehensive metabolic panel     Status: Abnormal   Collection Time: 10/03/18  9:36 PM  Result Value Ref Range   Sodium 132 (L) 135 - 145 mmol/L   Potassium 3.9 3.5 - 5.1 mmol/L   Chloride 99 98 - 111 mmol/L   CO2 19 (L) 22 - 32 mmol/L   Glucose, Bld 143 (H) 70 - 99 mg/dL   BUN 25 (H) 8 - 23 mg/dL   Creatinine, Ser 1.02 (H) 0.44 - 1.00 mg/dL   Calcium 9.6 8.9 - 10.3 mg/dL   Total Protein 7.3 6.5 - 8.1 g/dL   Albumin 3.1 (L) 3.5 - 5.0 g/dL   AST 46 (H) 15 - 41 U/L   ALT 41 0 - 44 U/L   Alkaline Phosphatase 85 38 - 126 U/L   Total Bilirubin 1.9 (H) 0.3 - 1.2 mg/dL   GFR calc non Af Amer 54 (L) >60 mL/min   GFR calc Af Amer >60 >60 mL/min   Anion gap 14 5 - 15    Comment: Performed at [facility], [address] [place], [place] [address]  CBC with Differential     Status: Abnormal   Collection Time: 10/03/18  9:36 PM  Result Value Ref Range   WBC 16.2 (H) 4.0 - 10.5 K/uL   RBC 5.52 (H) 3.87 - 5.11 MIL/uL   Hemoglobin 10.6 (L) 12.0 - 15.0 g/dL   HCT 33.9 (L) 36.0 - 46.0 %   MCV 61.4 (L) 80.0 - 100.0 fL   MCH 19.2 (L) 26.0 - 34.0 pg   MCHC 31.3 30.0 - 36.0 g/dL   RDW 15.9 (H) 11.5 - 15.5 %   Platelets 208 150 - 400 K/uL   nRBC 0.0 0.0 - 0.2 %   Neutrophils Relative % 90 %   Lymphocytes Relative 5 %   Monocytes Relative 5 %   Eosinophils Relative 0 %   Basophils  Relative 0 %   Band Neutrophils 0 %   Metamyelocytes Relative 0 %   Myelocytes 0 %   Promyelocytes Relative 0 %   Blasts 0 %   nRBC 0 0 /100 WBC   Other 0 %  Neutro Abs 14.6 (H) 1.7 - 7.7 K/uL   Lymphs Abs 0.8 0.7 - 4.0 K/uL   Monocytes Absolute 0.8 0.1 - 1.0 K/uL   Eosinophils Absolute 0.0 0.0 - 0.5 K/uL   Basophils Absolute 0.0 0.0 - 0.1 K/uL   RBC Morphology ELLIPTOCYTES     Comment: Performed at [facility], [place] [address] [place], [place] [address]  Protime-INR     Status: Abnormal   Collection Time: 10/03/18  9:36 PM  Result Value Ref Range   Prothrombin Time 15.9 (H) 11.4 - 15.2 seconds   INR 1.3 (H) 0.8 - 1.2    Comment: (NOTE) INR goal varies based on device and disease states. Performed at [facility], [place] [address], [place], [place] [address]   Troponin I - ONCE - STAT     Status: Abnormal   Collection Time: 10/03/18  9:36 PM  Result Value Ref Range   Troponin I 0.98 (HH) <0.03 ng/mL    Comment: CRITICAL RESULT CALLED TO, READ BACK BY AND VERIFIED WITH: JOHNSTON,B RN 10/03/2018 2307 JORDANS Performed at [facility], [place] [address] [place], [place] [address]   Lactic acid, plasma     Status: Abnormal   Collection Time: 10/03/18  9:37 PM  Result Value Ref Range   Lactic Acid, Venous 4.1 (HH) 0.5 - 1.9 mmol/L    Comment: CRITICAL RESULT CALLED TO, READ BACK BY AND VERIFIED WITH: JOHNSTON,B RN 10/03/2018 2230 JORDANS Performed at [facility], [place] [address] [place], [place] [address]   SARS Coronavirus 2     Status: None   Collection Time: 10/03/18 10:28 PM  Result Value Ref Range   SARS Coronavirus 2 NOT DETECTED NOT DETECTED    Comment: (NOTE) SARS-CoV-2 target nucleic acids are NOT DETECTED. The SARS-CoV-2 RNA is generally detectable in upper and lower respiratory specimens during the acute phase of infection.  Negative  results do not preclude SARS-CoV-2 infection, do not rule out co-infections with other pathogens, and  should not be used as the sole basis for treatment or other patient management decisions.  Negative results must be combined with clinical observations, patient history, and epidemiological information. The expected result is Not Detected. Fact Sheet for Patients: http://www.biofiredefense.com/wp-content/uploads/2020/03/BIOFIRE-COVID -19-patients.pdf Fact Sheet for Healthcare Providers: http://www.biofiredefense.com/wp-content/uploads/2020/03/BIOFIRE-COVID -19-hcp.pdf This test is not yet approved or cleared by the [place] and  has been authorized for detection and/or diagnosis of SARS-CoV-2 by FDA under an Emergency Use Authorization (EUA).  This EUA will remain in effec t (meaning this test can be used) for the duration of  the COVID-19 declaration under Section 564(b)(1) of the Act, 21 U.S.C. section 360bbb-3(b)(1), unless the authorization is terminated or revoked sooner. Performed at [facility], [place] [address], [place], [place] [address]   Lactic acid, plasma     Status: Abnormal   Collection Time: 10/04/18  1:00 AM  Result Value Ref Range   Lactic Acid, Venous 2.4 (HH) 0.5 - 1.9 mmol/L    Comment: CRITICAL RESULT CALLED TO, READ BACK BY AND VERIFIED WITH: JOHNSTON,B RN 10/04/2018 0153 JORDANS Performed at [facility], [place] [address], [place], [place] [address]   Troponin I - Now Then Q3H     Status: Abnormal   Collection Time: 10/04/18  1:00 AM  Result Value Ref Range   Troponin I 0.91 (HH) <0.03 ng/mL    Comment: CRITICAL VALUE NOTED.  VALUE IS CONSISTENT WITH PREVIOUSLY REPORTED AND CALLED VALUE.  Performed at [facility], [place] [address] [place], [place] [address]   Urinalysis, Routine w reflex microscopic     Status: None   Collection Time: 10/04/18  1:12 AM  Result Value Ref Range   Color, Urine YELLOW YELLOW   APPearance CLEAR CLEAR   Specific Gravity, Urine 1.029 1.005 - 1.030   pH 6.0 5.0 - 8.0   Glucose, UA NEGATIVE NEGATIVE  mg/dL   Hgb urine dipstick NEGATIVE NEGATIVE   Bilirubin Urine NEGATIVE NEGATIVE   Ketones, ur NEGATIVE NEGATIVE mg/dL   Protein, ur NEGATIVE NEGATIVE mg/dL   Nitrite NEGATIVE NEGATIVE   Leukocytes,Ua NEGATIVE NEGATIVE    Comment: Performed at [place] [address] [place],  [address]   Ct Abdomen Pelvis Wo Contrast  Result Date: 10/04/2018 CLINICAL DATA:  Sepsis EXAM: CT ABDOMEN AND PELVIS WITHOUT CONTRAST TECHNIQUE: Multidetector CT imaging of the abdomen and pelvis was performed following the standard protocol without IV contrast. COMPARISON:  06/17/2018 FINDINGS: Lower chest: No acute abnormality. Hepatobiliary: There is hepatic steatosis. The liver appears nodular in contour. The patient is status post prior cholecystectomy. Pancreas: The pancreas is atrophic and fatty replaced. Spleen: The spleen is enlarged measuring approximately 14 cm craniocaudad. Adrenals/Urinary Tract: Adrenal glands are unremarkable. Kidneys are normal, without renal calculi, focal lesion, or hydronephrosis. Bladder is unremarkable. Detection of stones is limited by the presence of IV contrast from prior contrast enhanced CT of the chest. Stomach/Bowel: There is sigmoid diverticulosis without CT evidence of diverticulitis. The appendix is unremarkable. The stomach is unremarkable. There is no evidence of a small-bowel obstruction. Vascular/Lymphatic: Aortic atherosclerosis. No enlarged abdominal or pelvic lymph nodes. Reproductive: Uterus and bilateral adnexa are unremarkable. Other: No abdominal wall hernia or abnormality. No abdominopelvic ascites. Musculoskeletal: No acute or significant osseous findings. IMPRESSION: 1. No acute intra-abdominal abnormality detected. No findings to explain the patient's sepsis. 2. Cirrhotic appearing liver with stigmata of portal hypertension including splenomegaly. 3. Status post cholecystectomy. 4. Normal appendix in the right lower quadrant. 5. No hydronephrosis. 6.   Choose 1 Electronically Signed   By: Constance Holster M.D.   On: 10/04/2018 03:03   Ct Angio Chest Pe W And/or Wo Contrast  Result Date: 10/04/2018 CLINICAL DATA:  Shortness of breath EXAM: CT ANGIOGRAPHY CHEST WITH CONTRAST TECHNIQUE: Multidetector CT imaging of the chest was performed using the standard protocol during bolus administration of intravenous contrast. Multiplanar CT image reconstructions and MIPs were obtained to evaluate the vascular anatomy. CONTRAST:  49m OMNIPAQUE IOHEXOL 350 MG/ML SOLN COMPARISON:  CT chest dated 06/18/2018 FINDINGS: Cardiovascular: The heart size is significantly enlarged. The patient is status post TAVR. Mitral valve calcifications are noted. Coronary artery calcifications are noted. Thoracic aortic calcifications are noted. Mediastinum/Nodes: The patient appears to be status post prior thyroidectomy. There are no pathologically enlarged mediastinal or hilar lymph nodes. No enlarged axillary or supraclavicular lymph nodes. Lungs/Pleura: The lung volumes are low. There is no large pleural effusion. No pneumothorax. The trachea is unremarkable. There is presumed atelectasis at the lung bases bilaterally, left worse than right. Upper Abdomen: The liver appears enlarged. There is likely underlying hepatic steatosis. The patient is status post prior cholecystectomy. The spleen is enlarged. Musculoskeletal: No chest wall abnormality. No acute or significant osseous findings. Review of the MIP images confirms the above findings. IMPRESSION: 1. No PE. 2. Cardiomegaly.  The patient is status post TAVR. 3. Low lung volumes. There is no pneumothorax or significant pleural effusion. 4. Probable hepatic  steatosis and hepatomegaly. 5. Splenomegaly. Aortic Atherosclerosis (ICD10-I70.0). Electronically Signed   By: Constance Holster M.D.   On: 10/04/2018 00:16   Dg Chest Portable 1 View  Result Date: 10/03/2018 CLINICAL DATA:  Shortness of breath EXAM: PORTABLE CHEST 1 VIEW  COMPARISON:  07/08/2018 FINDINGS: The heart is enlarged. The patient is status post prior TAVR. There is no pneumothorax. No large pleural effusion. No significant area of consolidation. There is no pneumothorax. No acute osseous abnormality. IMPRESSION: No active disease. Electronically Signed   By: Constance Holster M.D.   On: 10/03/2018 22:21    Pending Labs Unresulted Labs (From admission, onward)    Start     Ordered   10/05/18 0500  Heparin level (unfractionated)  Daily,   R     10/03/18 2327   10/04/18 0730  Heparin level (unfractionated)  Once-Timed,   STAT     10/03/18 2327   10/04/18 0730  CBC  Daily,   R     10/03/18 2327   10/04/18 0500  Protime-INR  Tomorrow morning,   R     10/04/18 0320   10/04/18 0500  Cortisol-am, blood  Tomorrow morning,   R     10/04/18 0320   10/04/18 0500  Procalcitonin  Tomorrow morning,   R     10/04/18 0320   10/04/18 0500  Comprehensive metabolic panel  Tomorrow morning,   R     10/04/18 0320   10/04/18 0325  TSH  Once,   STAT     10/04/18 0324   10/03/18 2351  Troponin I - Now Then Q3H  Now then every 3 hours,   STAT     10/03/18 2351   10/03/18 2132  Culture, blood (Routine x 2)  BLOOD CULTURE X 2,   STAT     10/03/18 2132          Vitals/Pain Today's Vitals   10/03/18 2215 10/03/18 2230 10/03/18 2245 10/03/18 2300  BP: (!) 107/41 (!) 115/53 (!) 120/56 (!) 132/95  Pulse: 85 83 83 88  Resp: 14 (!) 21 (!) 25 (!) 23  Temp:      SpO2: 95% 91% 91% 94%  Weight:    91.6 kg  Height:    5' 5"  (1.651 m)  PainSc:        Isolation Precautions No active isolations  Medications Medications  sodium chloride flush (NS) 0.9 % injection 3 mL (3 mLs Intravenous Not Given 10/03/18 2155)  heparin ADULT infusion 100 units/mL (25000 units/225m sodium chloride 0.45%) (1,000 Units/hr Intravenous New Bag/Given 10/04/18 0135)  vancomycin (VANCOCIN) IVPB 1000 mg/200 mL premix (has no administration in time range)  0.9 %  sodium chloride infusion  (has no administration in time range)  acyclovir (ZOVIRAX) 600 mg in dextrose 5 % 100 mL IVPB (has no administration in time range)  piperacillin-tazobactam (ZOSYN) IVPB 3.375 g (has no administration in time range)  acetaminophen (TYLENOL) tablet 650 mg (has no administration in time range)  clopidogrel (PLAVIX) tablet 75 mg (has no administration in time range)  aspirin chewable tablet 81 mg (has no administration in time range)  bisoprolol (ZEBETA) tablet 5 mg (has no administration in time range)  escitalopram (LEXAPRO) tablet 20 mg (has no administration in time range)  levothyroxine (SYNTHROID) tablet 175 mcg (has no administration in time range)  pantoprazole (PROTONIX) EC tablet 40 mg (has no administration in time range)  nortriptyline (PAMELOR) capsule 25 mg (has no administration in time range)  multivitamin with  minerals tablet 1 tablet (has no administration in time range)  ceFEPIme (MAXIPIME) 2 g in sodium chloride 0.9 % 100 mL IVPB (0 g Intravenous Stopped 10/04/18 0252)  metroNIDAZOLE (FLAGYL) IVPB 500 mg (0 mg Intravenous Stopped 10/04/18 0136)  sodium chloride 0.9 % bolus 1,000 mL (1,000 mLs Intravenous New Bag/Given 10/04/18 0253)    And  sodium chloride 0.9 % bolus 1,000 mL (0 mLs Intravenous Stopped 10/04/18 0252)    And  sodium chloride 0.9 % bolus 1,000 mL (0 mLs Intravenous Stopped 10/04/18 0252)  vancomycin (VANCOCIN) 2,000 mg in sodium chloride 0.9 % 500 mL IVPB (2,000 mg Intravenous New Bag/Given 10/04/18 0103)  heparin bolus via infusion 4,000 Units (4,000 Units Intravenous Bolus from Bag 10/04/18 0136)  aspirin chewable tablet 324 mg (324 mg Oral Given 10/04/18 0105)  iohexol (OMNIPAQUE) 350 MG/ML injection 80 mL (80 mLs Intravenous Contrast Given 10/03/18 2356)    Mobility walks with person assist High fall risk   Focused Assessments Neuro Assessment Handoff:  Confused, alert to name and place, keeps thinking her belongings are turtles in her room.              R Recommendations: See Admitting Provider Note  Report given to:   Additional Notes:

## 2018-10-04 NOTE — Progress Notes (Signed)
PROGRESS NOTE  Breanna Webster TKW:[id number] DOB: October 30, 1945 DOA: 10/03/2018 PCP: [name], NP   LOS: 0 days   Patient is from: Home  Brief Narrative / Interim history: 73 year old female with history of AS s/p TAVR in 3/20, CAD s/p stent to RCA in 03/2018, HTN, PHTN, DM-2, hypothyroidism, LBBB and GERD admitted with acute shortness of breath, fever and altered mental status.  Reportedly oriented to person and place only on admission.  In the ED, hemodynamically stable except for RR to 31.  Oxygen saturation 93% on 2 L.  Leukocytosis to 16,000.  Lactate of 4.1.  Elevated to 0.98.  CXR, CT head, CTA chest/abdomen/pelvis without acute significant finding.  COVID-19 negative.  Urinalysis negative.  Received 4 L of IV fluid bolus and lactate trended down to 2.4.  Blood cultures drawn.  Started on broad-spectrum antibiotics including acyclovir.  Cardiology consulted.  Patient was admitted for undifferentiated sepsis.   Subjective: No major events since this admission.  Complains about shortness of breath and dry cough.  She denies chest, headache, vision change, GI symptoms, GU symptoms or focal neuro symptoms.  Denies smoking cigarettes, drinking alcohol recreational drugs.  Oriented to self, place, month, the president and family member's name.   Assessment & Plan: Sepsis likely due to community-acquired pneumonia.  Imaging might be lagging.  Procalcitonin and lactic acid elevated. -Continue broad-spectrum antibiotics (vancomycin and Zosyn) pending cultures. -Blood doxycycline for atypical coverage.  QTC prolonged partly due to LBBB. -We will discontinue acyclovir at this time. -Mucolytic's, PRN DuoNeb, incentive spirometry and flutter valve -Trend procalcitonin and leukocytosis.  Acute metabolic encephalopathy: Likely due to the above.  CT head reassuring.  No focal neuro deficits.  Denies headache or nuchal rigidity.  Mental status improving. -Management as above  Dyspnea/dry  cough: Likely due to community-acquired pneumonia as above -Monitor as above  Elevated troponin/history of CAD status post stent in 03/2018: Patient without chest pain.  No signs of fluid overload.  Elevated troponin likely demand ischemia from sepsis/pneumonia. -Cardiology managing -On heparin drip -Continue home bisoprolol, Plavix and aspirin. -History of statin intolerance.  Could be a candidate for PCSK9 inhibitors. -Reduce IV fluid to 50 cc/h. -Follow echocardiogram.  Hypertension: Normotensive -Continue home this problem. -Hold home losartan and HCTZ for now  Poorly controlled IDDM-2 with hyperglycemia: Last A1c 9.3% in 3/20. On  NPH 120 units daily. -Recheck A1c -Lantus 40 units daily, NovoLog 6 units with meals and sliding scale-high  Hypothyroidism: TSH within normal range. -Continue home Synthroid  Depression -Continue home Lexapro and nortriptyline  GERD -PPI  Scheduled Meds:  aspirin  81 mg Oral Daily   bisoprolol  5 mg Oral Daily   clopidogrel  75 mg Oral Q breakfast   escitalopram  20 mg Oral Daily   insulin aspart  0-20 Units Subcutaneous TID WC   insulin aspart  6 Units Subcutaneous TID WC   insulin glargine  40 Units Subcutaneous Daily   levothyroxine  175 mcg Oral Q0600   multivitamin with minerals  1 tablet Oral Daily   nortriptyline  25 mg Oral QHS   pantoprazole  40 mg Oral BID   sodium chloride flush  3 mL Intravenous Once   Continuous Infusions:  sodium chloride 100 mL/hr at 10/04/18 0444   acyclovir 600 mg (10/04/18 0558)   heparin 1,200 Units/hr (10/04/18 1052)   piperacillin-tazobactam (ZOSYN)  IV 3.375 g (10/04/18 0900)   vancomycin     PRN Meds:.acetaminophen   DVT prophylaxis: On heparin drip  Code Status: Full code Family Communication: Updated patient's husband over the phone. Disposition Plan: Remains inpatient for sepsis likely due to community-acquired pneumonia, acute metabolic encephalopathy and elevated  troponin.  Consultants:   Cardiology  Procedures:   None  Microbiology:  [id number] negative  Blood culture pending  Urine culture pending  Antimicrobials: Anti-infectives (From admission, onward)   Start     Dose/Rate Route Frequency Ordered Stop   10/04/18 1200  vancomycin (VANCOCIN) IVPB 1000 mg/200 mL premix     1,000 mg 200 mL/hr over 60 Minutes Intravenous Every 12 hours 10/03/18 2341     10/04/18 1000  ceFEPIme (MAXIPIME) 2 g in sodium chloride 0.9 % 100 mL IVPB  Status:  Discontinued     2 g 200 mL/hr over 30 Minutes Intravenous Every 12 hours 10/03/18 2341 10/04/18 0320   10/04/18 0600  piperacillin-tazobactam (ZOSYN) IVPB 3.375 g     3.375 g 12.5 mL/hr over 240 Minutes Intravenous Every 8 hours 10/04/18 0323     10/04/18 0400  acyclovir (ZOVIRAX) 600 mg in dextrose 5 % 100 mL IVPB     600 mg 112 mL/hr over 60 Minutes Intravenous Every 8 hours 10/04/18 0323     10/03/18 2245  ceFEPIme (MAXIPIME) 2 g in sodium chloride 0.9 % 100 mL IVPB     2 g 200 mL/hr over 30 Minutes Intravenous  Once 10/03/18 2231 10/04/18 0252   10/03/18 2245  metroNIDAZOLE (FLAGYL) IVPB 500 mg     500 mg 100 mL/hr over 60 Minutes Intravenous  Once 10/03/18 2231 10/04/18 0136   10/03/18 2245  vancomycin (VANCOCIN) IVPB 1000 mg/200 mL premix  Status:  Discontinued     1,000 mg 200 mL/hr over 60 Minutes Intravenous  Once 10/03/18 2231 10/03/18 2235   10/03/18 2245  vancomycin (VANCOCIN) 2,000 mg in sodium chloride 0.9 % 500 mL IVPB     2,000 mg 250 mL/hr over 120 Minutes Intravenous  Once 10/03/18 2235 10/04/18 0354       Objective: Vitals:   10/04/18 0419 10/04/18 0728 10/04/18 0948 10/04/18 1106  BP: (!) 127/57 (!) 128/55 (!) 126/49 131/65  Pulse: 78 79 80 77  Resp: (!) 25 19 18 18   Temp: 97.8 F (36.6 C) (!) 97.5 F (36.4 C) 97.8 F (36.6 C) 98.4 F (36.9 C)  TempSrc: Oral Oral Oral Oral  SpO2: 94% 99% 99% 96%  Weight: 92.7 kg     Height:        Intake/Output Summary (Last  24 hours) at 10/04/2018 1114 Last data filed at 10/04/2018 0354 Gross per 24 hour  Intake 1000 ml  Output --  Net 1000 ml   Filed Weights   10/03/18 2300 10/04/18 0419  Weight: 91.6 kg 92.7 kg    Examination:  GENERAL: No acute distress.  Appears well.  HEENT: MMM.  Vision and hearing grossly intact.  NECK: Supple.  No nuchal rigidity. LUNGS:  No IWOB. Good air movement bilaterally. HEART:  RRR. Heart sounds normal.  ABD: Bowel sounds present. Soft. Non tender.  MSK/EXT:  Moves all extremities. No apparent deformity. No edema bilaterally.  SKIN: no apparent skin lesion or wound NEURO: Awake, alert and oriented to self, place, month, president and family name.  Cranial, motor, light sensation reflexes intact and symmetric.  PSYCH: Calm.    Data Reviewed: I have independently reviewed following labs and imaging studies   CBC: Recent Labs  Lab 10/03/18 2136 10/04/18 0750  WBC 16.2* 12.9*  NEUTROABS 14.6*  --  HGB 10.6* 9.3*  HCT 33.9* 31.0*  MCV 61.4* 62.5*  PLT 208 267   Basic Metabolic Panel: Recent Labs  Lab 10/03/18 2136 10/04/18 0750  NA 132* 135  K 3.9 3.6  CL 99 105  CO2 19* 20*  GLUCOSE 143* 147*  BUN 25* 21  CREATININE 1.02* 0.91  CALCIUM 9.6 8.4*   GFR: Estimated Creatinine Clearance: 61 mL/min (by C-G formula based on SCr of 0.91 mg/dL). Liver Function Tests: Recent Labs  Lab 10/03/18 2136 10/04/18 0750  AST 46* 38  ALT 41 30  ALKPHOS 85 78  BILITOT 1.9* 1.5*  PROT 7.3 6.1*  ALBUMIN 3.1* 2.7*   No results for input(s): LIPASE, AMYLASE in the last 168 hours. No results for input(s): AMMONIA in the last 168 hours. Coagulation Profile: Recent Labs  Lab 10/03/18 2136 10/04/18 0750  INR 1.3* 1.5*   Cardiac Enzymes: Recent Labs  Lab 10/03/18 2136 10/04/18 0100 10/04/18 0440  TROPONINI 0.98* 0.91* 0.83*   BNP (last 3 results) Recent Labs    02/19/18 1233  PROBNP 270   HbA1C: No results for input(s): HGBA1C in the last 72  hours. CBG: Recent Labs  Lab 10/04/18 0435 10/04/18 0732  GLUCAP 126* 126*   Lipid Profile: No results for input(s): CHOL, HDL, LDLCALC, TRIG, CHOLHDL, LDLDIRECT in the last 72 hours. Thyroid Function Tests: Recent Labs    10/04/18 0440  TSH 2.858   Anemia Panel: No results for input(s): VITAMINB12, FOLATE, FERRITIN, TIBC, IRON, RETICCTPCT in the last 72 hours. Urine analysis:    Component Value Date/Time   COLORURINE YELLOW 10/04/2018 0112   APPEARANCEUR CLEAR 10/04/2018 0112   LABSPEC 1.029 10/04/2018 0112   PHURINE 6.0 10/04/2018 0112   GLUCOSEU NEGATIVE 10/04/2018 0112   HGBUR NEGATIVE 10/04/2018 0112   HGBUR negative 12/13/2008 1035   BILIRUBINUR NEGATIVE 10/04/2018 0112   BILIRUBINUR neg 04/10/2018 1519   KETONESUR NEGATIVE 10/04/2018 0112   PROTEINUR NEGATIVE 10/04/2018 0112   UROBILINOGEN 0.2 04/10/2018 1519   UROBILINOGEN 0.2 09/18/2011 2049   NITRITE NEGATIVE 10/04/2018 0112   LEUKOCYTESUR NEGATIVE 10/04/2018 0112   Sepsis Labs: Invalid input(s): PROCALCITONIN, LACTICIDVEN  Recent Results (from the past 240 hour(s))  Culture, blood (Routine x 2)     Status: None (Preliminary result)   Collection Time: 10/03/18  9:30 PM   Specimen: BLOOD RIGHT HAND  Result Value Ref Range Status   Specimen Description BLOOD RIGHT HAND  Final   Special Requests   Final    BOTTLES DRAWN AEROBIC AND ANAEROBIC Blood Culture results may not be optimal due to an excessive volume of blood received in culture bottles   Culture   Final    NO GROWTH < 12 HOURS Performed at [facility], [place] [address], [place], [place] [address]    Report Status PENDING  Incomplete  Culture, blood (Routine x 2)     Status: None (Preliminary result)   Collection Time: 10/03/18  9:40 PM   Specimen: BLOOD LEFT HAND  Result Value Ref Range Status   Specimen Description BLOOD LEFT HAND  Final   Special Requests   Final    BOTTLES DRAWN AEROBIC ONLY Blood Culture results may not be optimal  due to an excessive volume of blood received in culture bottles   Culture   Final    NO GROWTH < 12 HOURS Performed at [facility], [place] [address], [place], [place] [address]    Report Status PENDING  Incomplete  SARS  Coronavirus 2     Status: None   Collection Time: 10/03/18 10:28 PM  Result Value Ref Range Status   SARS Coronavirus 2 NOT DETECTED NOT DETECTED Final    Comment: (NOTE) SARS-CoV-2 target nucleic acids are NOT DETECTED. The SARS-CoV-2 RNA is generally detectable in upper and lower respiratory specimens during the acute phase of infection.  Negative  results do not preclude SARS-CoV-2 infection, do not rule out co-infections with other pathogens, and should not be used as the sole basis for treatment or other patient management decisions.  Negative results must be combined with clinical observations, patient history, and epidemiological information. The expected result is Not Detected. Fact Sheet for Patients: http://www.biofiredefense.com/wp-content/uploads/2020/03/BIOFIRE-COVID -19-patients.pdf Fact Sheet for Healthcare Providers: http://www.biofiredefense.com/wp-content/uploads/2020/03/BIOFIRE-COVID -19-hcp.pdf This test is not yet approved or cleared by the [place] and  has been authorized for detection and/or diagnosis of SARS-CoV-2 by FDA under an Emergency Use Authorization (EUA).  This EUA will remain in effec t (meaning this test can be used) for the duration of  the COVID-19 declaration under Section 564(b)(1) of the Act, 21 U.S.C. section 360bbb-3(b)(1), unless the authorization is terminated or revoked sooner. Performed at [facility], [place] [address], [place], [place] [address]       Radiology Studies: Ct Abdomen Pelvis Wo Contrast  Result Date: 10/04/2018 CLINICAL DATA:  Sepsis EXAM: CT ABDOMEN AND PELVIS WITHOUT CONTRAST TECHNIQUE: Multidetector CT imaging of the abdomen and pelvis was performed following the standard  protocol without IV contrast. COMPARISON:  06/17/2018 FINDINGS: Lower chest: No acute abnormality. Hepatobiliary: There is hepatic steatosis. The liver appears nodular in contour. The patient is status post prior cholecystectomy. Pancreas: The pancreas is atrophic and fatty replaced. Spleen: The spleen is enlarged measuring approximately 14 cm craniocaudad. Adrenals/Urinary Tract: Adrenal glands are unremarkable. Kidneys are normal, without renal calculi, focal lesion, or hydronephrosis. Bladder is unremarkable. Detection of stones is limited by the presence of IV contrast from prior contrast enhanced CT of the chest. Stomach/Bowel: There is sigmoid diverticulosis without CT evidence of diverticulitis. The appendix is unremarkable. The stomach is unremarkable. There is no evidence of a small-bowel obstruction. Vascular/Lymphatic: Aortic atherosclerosis. No enlarged abdominal or pelvic lymph nodes. Reproductive: Uterus and bilateral adnexa are unremarkable. Other: No abdominal wall hernia or abnormality. No abdominopelvic ascites. Musculoskeletal: No acute or significant osseous findings. IMPRESSION: 1. No acute intra-abdominal abnormality detected. No findings to explain the patient's sepsis. 2. Cirrhotic appearing liver with stigmata of portal hypertension including splenomegaly. 3. Status post cholecystectomy. 4. Normal appendix in the right lower quadrant. 5. No hydronephrosis. 6.  Choose 1 Electronically Signed   By: Constance Holster M.D.   On: 10/04/2018 03:03   Ct Head Wo Contrast  Result Date: 10/04/2018 CLINICAL DATA:  Encephalopathy EXAM: CT HEAD WITHOUT CONTRAST TECHNIQUE: Contiguous axial images were obtained from the base of the skull through the vertex without intravenous contrast. COMPARISON:  None. FINDINGS: Brain: Mild age related volume loss. No acute intracranial abnormality. Specifically, no hemorrhage, hydrocephalus, mass lesion, acute infarction, or significant intracranial injury.  Vascular: No hyperdense vessel or unexpected calcification. Skull: No acute calvarial abnormality. Sinuses/Orbits: Visualized paranasal sinuses and mastoids clear. Orbital soft tissues unremarkable. Other: None IMPRESSION: No acute intracranial abnormality. Electronically Signed   By: Rolm Baptise M.D.   On: 10/04/2018 03:51   Ct Angio Chest Pe W And/or Wo Contrast  Result Date: 10/04/2018 CLINICAL DATA:  Shortness of breath EXAM: CT ANGIOGRAPHY CHEST WITH CONTRAST TECHNIQUE: Multidetector CT imaging of the  chest was performed using the standard protocol during bolus administration of intravenous contrast. Multiplanar CT image reconstructions and MIPs were obtained to evaluate the vascular anatomy. CONTRAST:  41m OMNIPAQUE IOHEXOL 350 MG/ML SOLN COMPARISON:  CT chest dated 06/18/2018 FINDINGS: Cardiovascular: The heart size is significantly enlarged. The patient is status post TAVR. Mitral valve calcifications are noted. Coronary artery calcifications are noted. Thoracic aortic calcifications are noted. Mediastinum/Nodes: The patient appears to be status post prior thyroidectomy. There are no pathologically enlarged mediastinal or hilar lymph nodes. No enlarged axillary or supraclavicular lymph nodes. Lungs/Pleura: The lung volumes are low. There is no large pleural effusion. No pneumothorax. The trachea is unremarkable. There is presumed atelectasis at the lung bases bilaterally, left worse than right. Upper Abdomen: The liver appears enlarged. There is likely underlying hepatic steatosis. The patient is status post prior cholecystectomy. The spleen is enlarged. Musculoskeletal: No chest wall abnormality. No acute or significant osseous findings. Review of the MIP images confirms the above findings. IMPRESSION: 1. No PE. 2. Cardiomegaly.  The patient is status post TAVR. 3. Low lung volumes. There is no pneumothorax or significant pleural effusion. 4. Probable hepatic steatosis and hepatomegaly. 5.  Splenomegaly. Aortic Atherosclerosis (ICD10-I70.0). Electronically Signed   By: CConstance HolsterM.D.   On: 10/04/2018 00:16   Dg Chest Portable 1 View  Result Date: 10/03/2018 CLINICAL DATA:  Shortness of breath EXAM: PORTABLE CHEST 1 VIEW COMPARISON:  07/08/2018 FINDINGS: The heart is enlarged. The patient is status post prior TAVR. There is no pneumothorax. No large pleural effusion. No significant area of consolidation. There is no pneumothorax. No acute osseous abnormality. IMPRESSION: No active disease. Electronically Signed   By: CConstance HolsterM.D.   On: 10/03/2018 22:21    Hamish Banks T. GRehabilitation Institute Of MichiganTri[facility] Hospitalists Pager [phone] If 7PM-7AM, please contact night-coverage [email or web address] Password [facility]10/04/2018, 11:14 AM

## 2018-10-04 NOTE — Progress Notes (Signed)
This encounter was created in error - please disregard.

## 2018-10-04 NOTE — ED Notes (Signed)
Patient transported to CT 

## 2018-10-04 NOTE — Progress Notes (Signed)
I have seen, examined the patient, and reviewed Dr Fudim's assessment and plan.  Changes to above are made where necessary.  Denies CP at this time.  I suspect demand ischemia in the setting of medical illness.  No changes at this time.  See Dr Freddy Finner consult note for recs  Thompson Grayer, MD 10/04/2018 8:33 AM

## 2018-10-04 NOTE — Progress Notes (Signed)
The patient was sleeping. Flutter valve placed at bedside.

## 2018-10-04 NOTE — ED Notes (Signed)
Husband updated about condition.

## 2018-10-04 NOTE — Progress Notes (Signed)
ANTICOAGULATION CONSULT NOTE - Follow Up Consult  Pharmacy Consult for heparin Indication: chest pain/ACS  Allergies  Allergen Reactions  . Statins Other (See Comments)    Muscle aches    Patient Measurements: Height: 5' 5"  (165.1 cm) Weight: 204 lb 6.4 oz (92.7 kg) IBW/kg (Calculated) : 57 Heparin Dosing Weight: 77.4 kg  Vital Signs: Temp: 98 F (36.7 C) (06/13 1603) Temp Source: Oral (06/13 1603) BP: 117/54 (06/13 1603) Pulse Rate: 66 (06/13 1603)  Labs: Recent Labs    10/03/18 2136 10/04/18 0100 10/04/18 0440 10/04/18 0750 10/04/18 1929  HGB 10.6*  --   --  9.3*  --   HCT 33.9*  --   --  31.0*  --   PLT 208  --   --  167  --   LABPROT 15.9*  --   --  18.1*  --   INR 1.3*  --   --  1.5*  --   HEPARINUNFRC  --   --   --  0.19* 0.26*  CREATININE 1.02*  --   --  0.91  --   TROPONINI 0.98* 0.91* 0.83*  --   --     Estimated Creatinine Clearance: 61 mL/min (by C-G formula based on SCr of 0.91 mg/dL).   Assessment: 73yo female c/o SOB and fever, troponin found to be elevated. Pharmacy consulted for heparin. Patient not on anticoagulation PTA.  Repeat heparin level remains subtherapeutic at 0.26. No issues with infusion or S/Sx bleeding per RN.  Goal of Therapy:  Heparin level 0.3-0.7 units/ml Monitor platelets by anticoagulation protocol: Yes   Plan:  -Increase heparin infusion to 1400 units/hr -Recheck heparin level with daily labs   Arrie Senate, PharmD, BCPS Clinical Pharmacist [phone] Please check AMION for all [place] numbers 10/04/2018

## 2018-10-04 NOTE — Consult Note (Signed)
Cardiology Consult    Patient ID: Breanna Webster MRN: [id number], DOB/AGE: 1945/08/05   Admit date: 10/03/2018 Date of Consult: 10/04/2018  Primary Physician: [name], NP Primary Cardiologist: Sinclair Grooms, MD   Patient Profile    Breanna Webster is a 73 y.o. female with a history of CAD, s/p PCI in 2019, AS s/p TAVR. Normal TTE few months ago. She presents with SOB and confusion to the ED. Started all suddenly overnight. No CP. No local complaints. No LEE, PND or orthopnea.   Labs reveal trop of 0.9. ECG with LBBB. Lact 4. WBC 16. CT chest neg for PE.   Past Medical History   Past Medical History:  Diagnosis Date   Arthritis    "back" (04/22/2018)   CAD (coronary artery disease)    a. 03/2018 s/p PCI/DES to the RCA (3.0x15 Onyx DES).   Carotid artery stenosis    Mild   Chronic lower back pain    Colon polyps    Diverticulitis    Diverticulosis    Esophageal thickening    seen on pre TAVR CT scan, also questionable cirrhosis. MRI recommended. Will refer to GI   GERD (gastroesophageal reflux disease)    Grave's disease    History of colonic polyps 05/22/2017   History of hiatal hernia    Hypertension    Hypothyroidism    IBS (irritable bowel syndrome)    Osteopenia    Pulmonary nodules    seen on pre TAVR CT. likley benign. no follow up recommended if pt low risk.   S/P TAVR (transcatheter aortic valve replacement)    Severe aortic stenosis    Thalassemia minor    Type II diabetes mellitus ([place])     Past Surgical History:  Procedure Laterality Date   53 HOUR [place] STUDY N/A 03/03/2018   Procedure: 24 HOUR PH STUDY;  Surgeon: Mauri Pole, MD;  Location: [facility];  Service: Endoscopy;  Laterality: N/A;   COLONOSCOPY W/ BIOPSIES AND POLYPECTOMY     CORONARY ANGIOGRAPHY Right 04/21/2018   Procedure: CORONARY ANGIOGRAPHY (CATH LAB);  Surgeon: [name], MD;  Location: [facility];  Service: Cardiovascular;   Laterality: Right;   CORONARY STENT INTERVENTION N/A 04/22/2018   Procedure: CORONARY STENT INTERVENTION;  Surgeon: [name], MD;  Location: [facility];  Service: Cardiovascular;  Laterality: N/A;   DILATION AND CURETTAGE OF UTERUS     ESOPHAGEAL MANOMETRY N/A 03/03/2018   Procedure: ESOPHAGEAL MANOMETRY (EM);  Surgeon: Mauri Pole, MD;  [facility];  Service: Endoscopy;  Laterality: N/A;   HERNIA REPAIR     HYSTEROSCOPY     fibroids   LAPAROSCOPIC CHOLECYSTECTOMY     LAPAROSCOPY     fibroids   NISSEN FUNDOPLICATION  7353G   RIGHT/LEFT HEART CATH AND CORONARY ANGIOGRAPHY N/A 02/20/2018   Procedure: RIGHT/LEFT HEART CATH AND CORONARY ANGIOGRAPHY;  Surgeon: [name], MD;  Location: [facility];  Service: Cardiovascular;  Laterality: N/A;   TEE WITHOUT CARDIOVERSION N/A 07/08/2018   Procedure: TRANSESOPHAGEAL ECHOCARDIOGRAM (TEE);  Surgeon: Burnell Blanks, MD;  Location: [facility];  Service: Open Heart Surgery;  Laterality: N/A;   TONSILLECTOMY     TRANSCATHETER AORTIC VALVE REPLACEMENT, TRANSFEMORAL N/A 07/08/2018   Procedure: TRANSCATHETER AORTIC VALVE REPLACEMENT, TRANSFEMORAL;  Surgeon: Burnell Blanks, MD;  Location: [place] [facility];  Service: Open Heart Surgery;  Laterality: N/A;     Allergies  Allergies  Allergen Reactions  Statins Other (See Comments)    Muscle aches      Inpatient Medications     aspirin  324 mg Oral Once   heparin  4,000 Units Intravenous Once   sodium chloride flush  3 mL Intravenous Once    Family History    Family History  Adopted: Yes  Problem Relation Age of Onset   Healthy Son        x 2   Headache Other        Cluster headaches   Heart failure Mother    Colon cancer Neg Hx    Pancreatic cancer Neg Hx    Rectal cancer Neg Hx    Stomach cancer Neg Hx    is adopted.    Social History    Social History   Socioeconomic History   Marital status: Married     Spouse name: Not on file   Number of children: 2   Years of education: Not on file   Highest education level: Not on file  Occupational History   Occupation: Tree surgeon of the [employer] strain: Not on file   Food insecurity    Worry: Not on file    Inability: Not on file   Transportation needs    Medical: Not on file    Non-medical: Not on file  Tobacco Use   Smoking status: Never Smoker   Smokeless tobacco: Never Used  Substance and Sexual Activity   Alcohol use: No    Alcohol/week: 0.0 standard drinks   Drug use: Never   Sexual activity: Not Currently  Lifestyle   Physical activity    Days per week: Not on file    Minutes per session: Not on file   Stress: Not on file  Relationships   Social connections    Talks on phone: Not on file    Gets together: Not on file    Attends religious service: Not on file    Active member of club or organization: Not on file    Attends meetings of clubs or organizations: Not on file    Relationship status: Not on file   Intimate partner violence    Fear of current or ex partner: Not on file    Emotionally abused: Not on file    Physically abused: Not on file    Forced sexual activity: Not on file  Other Topics Concern   Not on file  Social History Narrative   Lives with husband in a one story home.     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   Education: college.     Review of Systems    General:  No chills, fever, night sweats or weight changes.  Cardiovascular:  No chest pain, dyspnea on exertion, edema, orthopnea, palpitations, paroxysmal nocturnal dyspnea. Dermatological: No rash, lesions/masses Respiratory: No cough, dyspnea Urologic: No hematuria, dysuria Abdominal:   No nausea, vomiting, diarrhea, bright red blood per rectum, melena, or hematemesis Neurologic:  No visual changes,  wkns, changes in mental status. All other systems reviewed and are otherwise negative except as noted above.  Physical Exam    Blood pressure (!) 132/95, pulse 88, temperature (!) 100.9 F (38.3 C), resp. rate (!) 23, height 5' 5"  (1.651 m), weight 91.6 kg, SpO2 94 %.  General: Pleasant, confused Psych: Normal affect. Neuro: Alert and oriented X 3. Moves all extremities spontaneously. HEENT:  Normal  Neck: Supple without bruits or JVD. Lungs:  Resp regular and unlabored, CTA. Heart: RRR no s3, s4, or murmurs. Abdomen: Soft, non-tender, non-distended, BS + x 4.  Extremities: No clubbing, cyanosis or edema. DP/PT/Radials 2+ and equal bilaterally.  Labs    Troponin (Point of Care Test) No results for input(s): TROPIPOC in the last 72 hours. Recent Labs    10/03/18 2136  TROPONINI 0.98*   Lab Results  Component Value Date   WBC 16.2 (H) 10/03/2018   HGB 10.6 (L) 10/03/2018   HCT 33.9 (L) 10/03/2018   MCV 61.4 (L) 10/03/2018   PLT 208 10/03/2018    Recent Labs  Lab 10/03/18 2136  NA 132*  K 3.9  CL 99  CO2 19*  BUN 25*  CREATININE 1.02*  CALCIUM 9.6  PROT 7.3  BILITOT 1.9*  ALKPHOS 85  ALT 41  AST 46*  GLUCOSE 143*   Lab Results  Component Value Date   CHOL 159 06/14/2017   HDL 41.20 06/14/2017   LDLCALC 93 06/14/2017   TRIG 123.0 06/14/2017   Lab Results  Component Value Date   DDIMER 0.75 (H) 07/19/2016     Radiology Studies    Ct Angio Chest Pe W And/or Wo Contrast  Result Date: 10/04/2018 CLINICAL DATA:  Shortness of breath EXAM: CT ANGIOGRAPHY CHEST WITH CONTRAST TECHNIQUE: Multidetector CT imaging of the chest was performed using the standard protocol during bolus administration of intravenous contrast. Multiplanar CT image reconstructions and MIPs were obtained to evaluate the vascular anatomy. CONTRAST:  21m OMNIPAQUE IOHEXOL 350 MG/ML SOLN COMPARISON:  CT chest dated 06/18/2018 FINDINGS: Cardiovascular: The heart size is significantly enlarged.  The patient is status post TAVR. Mitral valve calcifications are noted. Coronary artery calcifications are noted. Thoracic aortic calcifications are noted. Mediastinum/Nodes: The patient appears to be status post prior thyroidectomy. There are no pathologically enlarged mediastinal or hilar lymph nodes. No enlarged axillary or supraclavicular lymph nodes. Lungs/Pleura: The lung volumes are low. There is no large pleural effusion. No pneumothorax. The trachea is unremarkable. There is presumed atelectasis at the lung bases bilaterally, left worse than right. Upper Abdomen: The liver appears enlarged. There is likely underlying hepatic steatosis. The patient is status post prior cholecystectomy. The spleen is enlarged. Musculoskeletal: No chest wall abnormality. No acute or significant osseous findings. Review of the MIP images confirms the above findings. IMPRESSION: 1. No PE. 2. Cardiomegaly.  The patient is status post TAVR. 3. Low lung volumes. There is no pneumothorax or significant pleural effusion. 4. Probable hepatic steatosis and hepatomegaly. 5. Splenomegaly. Aortic Atherosclerosis (ICD10-I70.0). Electronically Signed   By: CConstance HolsterM.D.   On: 10/04/2018 00:16   Dg Chest Portable 1 View  Result Date: 10/03/2018 CLINICAL DATA:  Shortness of breath EXAM: PORTABLE CHEST 1 VIEW COMPARISON:  07/08/2018 FINDINGS: The heart is enlarged. The patient is status post prior TAVR. There is no pneumothorax. No large pleural effusion. No significant area of consolidation. There is no pneumothorax. No acute osseous abnormality. IMPRESSION: No active disease. Electronically Signed   By: CConstance HolsterM.D.   On: 10/03/2018 22:21   [place]domen Ruq W/elastography  Result Date: 09/30/2018 CLINICAL DATA:  Evaluate for cirrhosis.  Increased LFTs. EXAM: [place]DOMEN LIMITED - RIGHT UPPER QUADRANT ULTRASOUND HEPATIC ELASTOGRAPHY TECHNIQUE: Limited right upper quadrant abdominal ultrasound was performed. In  addition, ultrasound elastography evaluation of the liver was performed. A region of interest was placed in the right lobe of the liver. Following application  of a compressive sonographic pulse, shear waves were detected in the adjacent hepatic tissue and the shear wave velocity was calculated. Multiple assessments were performed at the selected site. Median shear wave velocity is correlated to a Metavir fibrosis score. COMPARISON:  CT AP 06/17/2018 FINDINGS: ULTRASOUND ABDOMEN LIMITED RIGHT UPPER QUADRANT Gallbladder: Status post cholecystectomy. Common bile duct: Diameter: 6 mm Liver: Diffuse coarsened echotexture noted. No mass identified. Portal vein is patent on color Doppler imaging with normal direction of blood flow towards the liver. ULTRASOUND HEPATIC ELASTOGRAPHY Device: Siemens Helix VTQ Patient position: Oblique Transducer 6C1 Number of measurements: 10 Hepatic segment:  8 Median velocity:   1.4 m/sec IQR: 0.3 IQR/Median velocity ratio: 0.21 Corresponding Metavir fibrosis score:  F2 + some F3 Risk of fibrosis: Moderate Limitations of exam: Coughing and shortness of breath Please note that abnormal shear wave velocities may also be identified in clinical settings other than with hepatic fibrosis, such as: acute hepatitis, elevated right heart and central venous pressures including use of beta blockers, veno-occlusive disease (Budd-Chiari), infiltrative processes such as mastocytosis/amyloidosis/infiltrative tumor, extrahepatic cholestasis, in the post-prandial state, and liver transplantation. Correlation with patient history, laboratory data, and clinical condition recommended. IMPRESSION: ULTRASOUND ABDOMEN: 1. Coarsened echotexture of the liver compatible with hepatic steatosis. 2. Status post cholecystectomy. ULTRASOUND HEPATIC ELASTOGRAHY: Median hepatic shear wave velocity is calculated at 1.4 m/sec. Corresponding Metavir fibrosis score is F2 + some F3. Risk of fibrosis is High. Follow-up:  Additional testing appropriate Electronically Signed   By: Kerby Moors M.D.   On: 09/30/2018 11:47    ECG & Cardiac Imaging    LBBB and NSR  Assessment & Plan    NSTEMI in setting of sepsis. Source of fever has not yet been identified. Recent TTE was normal . Recent cath with RCA stent prior to TAVR. She has a lactic acidosis and is confused, suggesting severe illness. NSTEMI likely in setting of hemodynamic stress.  Recommendation: - TTE - Trend trop - serial ECG - Cont on statin, ASA, start Heparin , cont on plavix - Cont on BB but hold ARB - Ok with iv hydration given that she appears euvolemic on exam   Signed, Cristina Gong, MD 10/04/2018, 12:53 AM  For questions or updates, please contact   Please consult [email or web address] for contact info under Cardiology/STEMI.

## 2018-10-05 ENCOUNTER — Inpatient Hospital Stay (HOSPITAL_COMMUNITY): Payer: Medicare Other

## 2018-10-05 DIAGNOSIS — R6520 Severe sepsis without septic shock: Secondary | ICD-10-CM

## 2018-10-05 DIAGNOSIS — R7881 Bacteremia: Secondary | ICD-10-CM

## 2018-10-05 DIAGNOSIS — Z952 Presence of prosthetic heart valve: Secondary | ICD-10-CM

## 2018-10-05 DIAGNOSIS — I10 Essential (primary) hypertension: Secondary | ICD-10-CM

## 2018-10-05 DIAGNOSIS — I340 Nonrheumatic mitral (valve) insufficiency: Secondary | ICD-10-CM

## 2018-10-05 DIAGNOSIS — I2510 Atherosclerotic heart disease of native coronary artery without angina pectoris: Secondary | ICD-10-CM

## 2018-10-05 DIAGNOSIS — G9340 Encephalopathy, unspecified: Secondary | ICD-10-CM

## 2018-10-05 DIAGNOSIS — A419 Sepsis, unspecified organism: Secondary | ICD-10-CM

## 2018-10-05 DIAGNOSIS — Z888 Allergy status to other drugs, medicaments and biological substances status: Secondary | ICD-10-CM

## 2018-10-05 DIAGNOSIS — E119 Type 2 diabetes mellitus without complications: Secondary | ICD-10-CM

## 2018-10-05 DIAGNOSIS — R7989 Other specified abnormal findings of blood chemistry: Secondary | ICD-10-CM

## 2018-10-05 DIAGNOSIS — R410 Disorientation, unspecified: Secondary | ICD-10-CM

## 2018-10-05 DIAGNOSIS — I361 Nonrheumatic tricuspid (valve) insufficiency: Secondary | ICD-10-CM

## 2018-10-05 LAB — GLUCOSE, CAPILLARY
Glucose-Capillary: 86 mg/dL (ref 70–99)
Glucose-Capillary: 89 mg/dL (ref 70–99)
Glucose-Capillary: 135 mg/dL — ABNORMAL HIGH (ref 70–99)
Glucose-Capillary: 131 mg/dL — ABNORMAL HIGH (ref 70–99)
Glucose-Capillary: 70 mg/dL (ref 70–99)

## 2018-10-05 LAB — FOLATE: Folate: 22.3 ng/mL (ref >5.9)

## 2018-10-05 LAB — COMPREHENSIVE METABOLIC PANEL
ALT: 26 U/L (ref 0–44)
AST: 34 U/L (ref 15–41)
Albumin: 2.6 g/dL — ABNORMAL LOW (ref 3.5–5.0)
Alkaline Phosphatase: 66 U/L (ref 38–126)
Anion gap: 9 (ref 5–15)
BUN: 11 mg/dL (ref 8–23)
CO2: 21 mmol/L — ABNORMAL LOW (ref 22–32)
Calcium: 8.9 mg/dL (ref 8.9–10.3)
Chloride: 108 mmol/L (ref 98–111)
Creatinine, Ser: 0.74 mg/dL (ref 0.44–1.00)
GFR calc Af Amer: >60 mL/min (ref >60)
GFR calc non Af Amer: >60 mL/min (ref >60)
Glucose, Bld: 110 mg/dL — ABNORMAL HIGH (ref 70–99)
Potassium: 3.3 mmol/L — ABNORMAL LOW (ref 3.5–5.1)
Sodium: 138 mmol/L (ref 135–145)
Total Bilirubin: 0.9 mg/dL (ref 0.3–1.2)
Total Protein: 6 g/dL — ABNORMAL LOW (ref 6.5–8.1)

## 2018-10-05 LAB — HEPARIN LEVEL (UNFRACTIONATED): Heparin Unfractionated: 0.47 IU/mL (ref 0.30–0.70)

## 2018-10-05 LAB — CBC
HCT: 28.4 % — ABNORMAL LOW (ref 36.0–46.0)
Hemoglobin: 8.6 g/dL — ABNORMAL LOW (ref 12.0–15.0)
MCH: 18.8 pg — ABNORMAL LOW (ref 26.0–34.0)
MCHC: 30.3 g/dL (ref 30.0–36.0)
MCV: 62.1 fL — ABNORMAL LOW (ref 80.0–100.0)
Platelets: 146 10*3/uL — ABNORMAL LOW (ref 150–400)
RBC: 4.57 MIL/uL (ref 3.87–5.11)
RDW: 15.9 % — ABNORMAL HIGH (ref 11.5–15.5)
WBC: 8.4 10*3/uL (ref 4.0–10.5)
nRBC: 0 % (ref 0.0–0.2)

## 2018-10-05 LAB — IRON AND TIBC
Iron: 55 ug/dL (ref 28–170)
Saturation Ratios: 20 % (ref 10.4–31.8)
TIBC: 281 ug/dL (ref 250–450)
UIBC: 226 ug/dL

## 2018-10-05 LAB — HEMOGLOBIN A1C
Hgb A1c MFr Bld: 8.5 % — ABNORMAL HIGH (ref 4.8–5.6)
Mean Plasma Glucose: 197.25 mg/dL

## 2018-10-05 LAB — MAGNESIUM: Magnesium: 2 mg/dL (ref 1.7–2.4)

## 2018-10-05 LAB — RETICULOCYTES
Immature Retic Fract: 17.3 % — ABNORMAL HIGH (ref 2.3–15.9)
RBC.: 4.99 MIL/uL (ref 3.87–5.11)
Retic Count, Absolute: 108.8 10*3/uL (ref 19.0–186.0)
Retic Ct Pct: 2.2 % (ref 0.4–3.1)

## 2018-10-05 LAB — FERRITIN: Ferritin: 24 ng/mL (ref 11–307)

## 2018-10-05 LAB — PROCALCITONIN: Procalcitonin: 3.66 ng/mL

## 2018-10-05 LAB — VITAMIN B12: Vitamin B-12: 877 pg/mL (ref 180–914)

## 2018-10-05 MED ORDER — INSULIN ASPART 100 UNIT/ML ~~LOC~~ SOLN
0.0000 [IU] | Freq: Three times a day (TID) | SUBCUTANEOUS | Status: DC
  Administered 2018-10-05 – 2018-10-06 (×3): 2 [IU] via SUBCUTANEOUS
  Administered 2018-10-06: 3 [IU] via SUBCUTANEOUS
  Administered 2018-10-07: 5 [IU] via SUBCUTANEOUS
  Administered 2018-10-08: 2 [IU] via SUBCUTANEOUS
  Administered 2018-10-08: 3 [IU] via SUBCUTANEOUS
  Administered 2018-10-09: 2 [IU] via SUBCUTANEOUS
  Administered 2018-10-09: 3 [IU] via SUBCUTANEOUS

## 2018-10-05 MED ORDER — INSULIN ASPART 100 UNIT/ML ~~LOC~~ SOLN: 0.0000 [IU] | Freq: Every day | SUBCUTANEOUS | Status: DC

## 2018-10-05 MED ORDER — POTASSIUM CHLORIDE CRYS ER 20 MEQ PO TBCR
40.0000 meq | EXTENDED_RELEASE_TABLET | ORAL | Status: AC
  Administered 2018-10-05 (×2): 40 meq via ORAL
  Filled 2018-10-05 (×2): qty 2

## 2018-10-05 MED ORDER — ENOXAPARIN SODIUM 40 MG/0.4ML ~~LOC~~ SOLN
40.0000 mg | SUBCUTANEOUS | Status: DC
  Administered 2018-10-05 – 2018-10-09 (×4): 40 mg via SUBCUTANEOUS
  Filled 2018-10-05 (×5): qty 0.4

## 2018-10-05 MED ORDER — DOXYCYCLINE HYCLATE 100 MG PO TABS
100.0000 mg | ORAL_TABLET | Freq: Two times a day (BID) | ORAL | Status: DC
  Administered 2018-10-05: 100 mg via ORAL
  Filled 2018-10-05: qty 1

## 2018-10-05 NOTE — Consult Note (Signed)
[facility] Infectious Disease       Reason for Consult: bacteremia    Referring Physician: Dr. Cyndia Skeeters  Principal Problem:   Sepsis [facility]) Active Problems:   Diabetes mellitus due to underlying condition, uncontrolled ([place])   Essential hypertension   Pulmonary hypertension ([place])   CAD in native artery   S/P TAVR (transcatheter aortic valve replacement)   Acute metabolic encephalopathy    aspirin  81 mg Oral Daily   bisoprolol  5 mg Oral Daily   clopidogrel  75 mg Oral Q breakfast   doxycycline  100 mg Oral Q12H   enoxaparin (LOVENOX) injection  40 mg Subcutaneous Q24H   escitalopram  20 mg Oral Daily   guaiFENesin  600 mg Oral BID   insulin aspart  0-15 Units Subcutaneous TID WC   insulin aspart  0-5 Units Subcutaneous QHS   insulin aspart  6 Units Subcutaneous TID WC   insulin glargine  40 Units Subcutaneous Daily   levothyroxine  175 mcg Oral Q0600   multivitamin with minerals  1 tablet Oral Daily   nortriptyline  25 mg Oral QHS   pantoprazole  40 mg Oral BID   potassium chloride  40 mEq Oral Q4H   sodium chloride flush  3 mL Intravenous Once    Recommendations: Repeat blood cultures TEE ceftriaxone  Will stop doxycycline  Assessment: She has bacteremia with a gram positive organism in both sets of blood cultures and recent TAVR.  Concern for endocarditis and will need a TEE.   Antibiotics: Ceftriaxone, doxycycline, acyclovir, cefepime, metronidazole, pip/tazo  HPI: Breanna Webster is a 73 y.o. female with a TAVR from March 2020, CAD, HTN, DM who came in 6/12 with fever, confusion.  Also with elevated troponin and put on a heparin drip.  Concern for meningitis and encephalitis but no LP done after starting a heparin drip. CTA done and no pulmonary findings.  No acute findings otherwise.  CT of abdomen concerning for cirrhosis though recent elastography by Dr. Havery Moros F2/3.  Previous CT of abdomen also suggested cirrhosis.     Review of Systems:  Constitutional: negative for fevers, chills and anorexia Respiratory: positive for cough or sputum, negative for hemoptysis or pleurisy/chest pain Gastrointestinal: negative for nausea and diarrhea Integument/breast: negative for rash Musculoskeletal: negative for myalgias and arthralgias All other systems reviewed and are negative    Past Medical History:  Diagnosis Date   Arthritis    "back" (04/22/2018)   CAD (coronary artery disease)    a. 03/2018 s/p PCI/DES to the RCA (3.0x15 Onyx DES).   Carotid artery stenosis    Mild   Chronic lower back pain    Colon polyps    Diverticulitis    Diverticulosis    Esophageal thickening    seen on pre TAVR CT scan, also questionable cirrhosis. MRI recommended. Will refer to GI   GERD (gastroesophageal reflux disease)    Grave's disease    History of colonic polyps 05/22/2017   History of hiatal hernia    Hypertension    Hypothyroidism    IBS (irritable bowel syndrome)    Osteopenia    Pulmonary nodules    seen on pre TAVR CT. likley benign. no follow up recommended if pt low risk.   S/P TAVR (transcatheter aortic valve replacement)    Severe aortic stenosis    Thalassemia minor    Type II diabetes mellitus ([facility])     Social History   Tobacco Use  Smoking status: Never Smoker   Smokeless tobacco: Never Used  Substance Use Topics   Alcohol use: No    Alcohol/week: 0.0 standard drinks   Drug use: Never    Family History  Adopted: Yes  Problem Relation Age of Onset   Healthy Son        x 2   Headache Other        Cluster headaches   Heart failure Mother    Colon cancer Neg Hx    Pancreatic cancer Neg Hx    Rectal cancer Neg Hx    Stomach cancer Neg Hx     Allergies  Allergen Reactions   Statins Other (See Comments)    Muscle aches    Physical Exam: Constitutional: in no apparent distress  Vitals:   10/05/18 0329 10/05/18 0847  BP: (!) 147/71 137/65   Pulse: 69 72  Resp: 20   Temp: 98.4 F (36.9 C) 98.9 F (37.2 C)  SpO2: 98% 95%   EYES: anicteric ENMT: no thrush Cardiovascular: Cor RRR Respiratory: CTA B; normal respiratory effort GI: Bowel sounds are normal, liver is not enlarged, spleen is not enlarged Musculoskeletal: no pedal edema noted Skin: negatives: no rash Neuro: non-focal  Lab Results  Component Value Date   WBC 8.4 10/05/2018   HGB 8.6 (L) 10/05/2018   HCT 28.4 (L) 10/05/2018   MCV 62.1 (L) 10/05/2018   PLT 146 (L) 10/05/2018    Lab Results  Component Value Date   CREATININE 0.74 10/05/2018   BUN 11 10/05/2018   NA 138 10/05/2018   K 3.3 (L) 10/05/2018   CL 108 10/05/2018   CO2 21 (L) 10/05/2018    Lab Results  Component Value Date   ALT 26 10/05/2018   AST 34 10/05/2018   ALKPHOS 66 10/05/2018     Microbiology: Recent Results (from the past 240 hour(s))  Culture, blood (Routine x 2)     Status: None (Preliminary result)   Collection Time: 10/03/18  9:30 PM   Specimen: BLOOD RIGHT HAND  Result Value Ref Range Status   Specimen Description BLOOD RIGHT HAND  Final   Special Requests   Final    BOTTLES DRAWN AEROBIC AND ANAEROBIC Blood Culture results may not be optimal due to an excessive volume of blood received in culture bottles   Culture  Setup Time   Final    GRAM POSITIVE COCCI IN CHAINS IN BOTH AEROBIC AND ANAEROBIC BOTTLES CRITICAL VALUE NOTED.  VALUE IS CONSISTENT WITH PREVIOUSLY REPORTED AND CALLED VALUE.    Culture   Final    NO GROWTH 2 DAYS Performed at [facility], [place] [address] [place], [place] [address]    Report Status PENDING  Incomplete  Culture, blood (Routine x 2)     Status: None (Preliminary result)   Collection Time: 10/03/18  9:40 PM   Specimen: BLOOD LEFT HAND  Result Value Ref Range Status   Specimen Description BLOOD LEFT HAND  Final   Special Requests   Final    BOTTLES DRAWN AEROBIC ONLY Blood Culture results may not be optimal due to an excessive  volume of blood received in culture bottles   Culture  Setup Time   Final    GRAM POSITIVE COCCI IN CHAINS AEROBIC BOTTLE ONLY Organism ID to follow CRITICAL RESULT CALLED TO, READ BACK BY AND VERIFIED WITH: RHRMD L SEAY @2211  10/04/18 BY S GEZAHEGN Performed at [facility], [place] [address], [place], [place] [address]  Culture GRAM POSITIVE COCCI  Final   Report Status PENDING  Incomplete  Blood Culture ID Panel (Reflexed)     Status: Abnormal   Collection Time: 10/03/18  9:40 PM  Result Value Ref Range Status   Enterococcus species NOT DETECTED NOT DETECTED Final   Listeria monocytogenes NOT DETECTED NOT DETECTED Final   Staphylococcus species NOT DETECTED NOT DETECTED Final   Staphylococcus aureus (BCID) NOT DETECTED NOT DETECTED Final   Streptococcus species DETECTED (A) NOT DETECTED Final    Comment: Not Enterococcus species, Streptococcus agalactiae, Streptococcus pyogenes, or Streptococcus pneumoniae. CRITICAL RESULT CALLED TO, READ BACK BY AND VERIFIED WITH: PHRMD L SEAY @2211  10/04/18 BY S GEZAHEGN    Streptococcus agalactiae NOT DETECTED NOT DETECTED Final   Streptococcus pneumoniae NOT DETECTED NOT DETECTED Final   Streptococcus pyogenes NOT DETECTED NOT DETECTED Final   Acinetobacter baumannii NOT DETECTED NOT DETECTED Final   Enterobacteriaceae species NOT DETECTED NOT DETECTED Final   Enterobacter cloacae complex NOT DETECTED NOT DETECTED Final   Escherichia coli NOT DETECTED NOT DETECTED Final   Klebsiella oxytoca NOT DETECTED NOT DETECTED Final   Klebsiella pneumoniae NOT DETECTED NOT DETECTED Final   Proteus species NOT DETECTED NOT DETECTED Final   Serratia marcescens NOT DETECTED NOT DETECTED Final   Haemophilus influenzae NOT DETECTED NOT DETECTED Final   Neisseria meningitidis NOT DETECTED NOT DETECTED Final   Pseudomonas aeruginosa NOT DETECTED NOT DETECTED Final   Candida albicans NOT DETECTED NOT DETECTED Final   Candida glabrata NOT DETECTED NOT  DETECTED Final   Candida krusei NOT DETECTED NOT DETECTED Final   Candida parapsilosis NOT DETECTED NOT DETECTED Final   Candida tropicalis NOT DETECTED NOT DETECTED Final    Comment: Performed at [facility], [place]. [address], [place], [place] [address]  SARS Coronavirus 2     Status: None   Collection Time: 10/03/18 10:28 PM  Result Value Ref Range Status   SARS Coronavirus 2 NOT DETECTED NOT DETECTED Final    Comment: (NOTE) SARS-CoV-2 target nucleic acids are NOT DETECTED. The SARS-CoV-2 RNA is generally detectable in upper and lower respiratory specimens during the acute phase of infection.  Negative  results do not preclude SARS-CoV-2 infection, do not rule out co-infections with other pathogens, and should not be used as the sole basis for treatment or other patient management decisions.  Negative results must be combined with clinical observations, patient history, and epidemiological information. The expected result is Not Detected. Fact Sheet for Patients: http://www.biofiredefense.com/wp-content/uploads/2020/03/BIOFIRE-COVID -19-patients.pdf Fact Sheet for Healthcare Providers: http://www.biofiredefense.com/wp-content/uploads/2020/03/BIOFIRE-COVID -19-hcp.pdf This test is not yet approved or cleared by the [place] and  has been authorized for detection and/or diagnosis of SARS-CoV-2 by FDA under an Emergency Use Authorization (EUA).  This EUA will remain in effec t (meaning this test can be used) for the duration of  the COVID-19 declaration under Section 564(b)(1) of the Act, 21 U.S.C. section 360bbb-3(b)(1), unless the authorization is terminated or revoked sooner. Performed at [facility], [place] [address], [place], [place] [address]     Alzena Gerber W Kitiara Hintze, [place] for Infectious Disease [facility] [email or web address] 10/05/2018, 12:59 PM

## 2018-10-05 NOTE — Progress Notes (Signed)
Occupational Therapy Evaluation Patient Details Name: Breanna Webster MRN: [id number] DOB: 16-Jun-1945 Today's Date: 10/05/2018    History of Present Illness 73 y.o. female with medical history significant of TAVR 06/2018, CAD, HTN, DM2.  Patient admitted with c/o SOB, AMS and sepsis with elevated troponin due to demand ischemia   Clinical Impression   PTA, pt was living at home with husband, and reports she was independent with ADL/IADL and functional mobility. Pt currently requires minguard during ADL while standing at sink level. Pt requires minguard for functional mobility at RW level. Pt continues to demonstrate cognitive limitations listed below (see OT problem list) impacting pt's independence and safety with ADL/IADL and functional mobility. Due to decline in current level of function, pt would benefit from acute OT to address established goals to facilitate safe D/C to venue listed below. At this time, recommend HHOT follow-up. Will continue to follow acutely.     Follow Up Recommendations  Home health OT;Supervision/Assistance - 24 hour    Equipment Recommendations  3 in 1 bedside commode    Recommendations for Other Services       Precautions / Restrictions Precautions Precautions: Fall Restrictions Weight Bearing Restrictions: No      Mobility Bed Mobility Overal bed mobility: Modified Independent             General bed mobility comments: HOB 20 degrees  Transfers Overall transfer level: Needs assistance Equipment used: Rolling walker (2 wheeled) Transfers: Sit to/from Stand Sit to Stand: Min guard         General transfer comment: minguard for safe hand placement, pt attempting to pull up on RW    Balance Overall balance assessment: Needs assistance   Sitting balance-Leahy Scale: Good     Standing balance support: No upper extremity supported;During functional activity Standing balance-Leahy Scale: Poor Standing balance comment: static standing  pt able to balance without support while washing hands at sink;pt required use of RW during ambulation                           ADL either performed or assessed with clinical judgement   ADL Overall ADL's : Needs assistance/impaired Eating/Feeding: Set up;Sitting   Grooming: Min guard;Standing Grooming Details (indicate cue type and reason): washed hands at sink;difficulty locating soap on wall Upper Body Bathing: Min guard;Sitting   Lower Body Bathing: Minimal assistance;Sit to/from stand   Upper Body Dressing : Min guard;Sitting   Lower Body Dressing: Minimal assistance;Sit to/from stand   Toilet Transfer: Min Marine scientist Details (indicate cue type and reason): simulated transfer from EOB to recliner Toileting- Clothing Manipulation and Hygiene: Min guard;Sit to/from stand       Functional mobility during ADLs: Min guard;Rolling walker General ADL Comments: pt with decreased safety awareness;decreased awareness of deficits;required vc for sequencing/locating items during ADL     Vision Patient Visual Report: No change from baseline Additional Comments: pt reports difficulty with vision;reports she has had an eye exam previously but did not get a prescription;pt stated no changes in baseline vision     Perception     Praxis      Pertinent Vitals/Pain Pain Assessment: No/denies pain     Hand Dominance Right   Extremity/Trunk Assessment Upper Extremity Assessment Upper Extremity Assessment: Overall WFL for tasks assessed   Lower Extremity Assessment Lower Extremity Assessment: Defer to PT evaluation   Cervical / Trunk Assessment Cervical / Trunk Assessment: Normal   Communication Communication Communication:  No difficulties   Cognition Arousal/Alertness: Awake/alert Behavior During Therapy: WFL for tasks assessed/performed Overall Cognitive Status: Impaired/Different from baseline Area of Impairment: Memory;Problem  solving;Safety/judgement;Orientation                 Orientation Level: Time;Disoriented to   Memory: Decreased short-term memory   Safety/Judgement: Decreased awareness of deficits;Decreased awareness of safety   Problem Solving: Slow processing General Comments: pt required vc to locate soap on wall when washing her hands;pt confused by reflection of light on tile asking "what is this" and pointing;required vc for safe hand placement and safe use of DME   General Comments       Exercises     Shoulder Instructions      Home Living Family/patient expects to be discharged to:: Private residence Living Arrangements: Spouse/significant other Available Help at Discharge: Family;Available 24 hours/day Type of Home: House Home Access: Stairs to enter [employer] of Steps: 4 Entrance Stairs-Rails: Right;Left Home Layout: One level     Bathroom Shower/Tub: Teacher, early years/pre: Standard     Home Equipment: Environmental consultant - 2 wheels          Prior Functioning/Environment Level of Independence: Independent        Comments: husband assists prn        OT Problem List: Decreased range of motion;Decreased activity tolerance;Impaired balance (sitting and/or standing);Decreased cognition;Decreased safety awareness;Decreased knowledge of use of DME or AE;Decreased knowledge of precautions      OT Treatment/Interventions: Self-care/ADL training;Therapeutic exercise;Energy conservation;DME and/or AE instruction;Therapeutic activities;Cognitive remediation/compensation;Patient/family education;Balance training    OT Goals(Current goals can be found in the care plan section) Acute Rehab OT Goals Patient Stated Goal: get home with her husband OT Goal Formulation: With patient Time For Goal Achievement: 10/19/18 Potential to Achieve Goals: Good ADL Goals Pt Will Perform Grooming: with modified independence;standing Pt Will Perform Upper Body Dressing: with  modified independence;standing;sitting Pt Will Perform Lower Body Dressing: with modified independence;sit to/from stand Pt Will Transfer to Toilet: with modified independence;ambulating Pt Will Perform Tub/Shower Transfer: with modified independence;ambulating  OT Frequency: Min 2X/week   Barriers to D/C:            Co-evaluation              AM-PAC OT "6 Clicks" Daily Activity     Outcome Measure Help from another person eating meals?: None Help from another person taking care of personal grooming?: A Little Help from another person toileting, which includes using toliet, bedpan, or urinal?: A Little Help from another person bathing (including washing, rinsing, drying)?: A Little Help from another person to put on and taking off regular upper body clothing?: A Little Help from another person to put on and taking off regular lower body clothing?: A Little 6 Click Score: 19   End of Session Equipment Utilized During Treatment: Gait belt;Rolling walker Nurse Communication: Mobility status  Activity Tolerance: Patient tolerated treatment well Patient left: in chair;with call bell/phone within reach;with chair alarm set  OT Visit Diagnosis: Unsteadiness on feet (R26.81);Other abnormalities of gait and mobility (R26.89);Muscle weakness (generalized) (M62.81);Other symptoms and signs involving cognitive function                Time: 0712-1975 OT Time Calculation (min): 15 min Charges:  OT General Charges $OT Visit: 1 Visit OT Evaluation $OT Eval Moderate Complexity: [place] OTR/L Acute Rehabilitation Services Office: [facility] 10/05/2018, 1:35 PM

## 2018-10-05 NOTE — Procedures (Signed)
Patient just seated and eating. Will attempt later.

## 2018-10-05 NOTE — Progress Notes (Signed)
PROGRESS NOTE  Breanna Webster:[id number] DOB: Dec 24, 1945 DOA: 10/03/2018 PCP: [name], NP   LOS: 1 day   Patient is from: Home  Brief Narrative / Interim history: 73 year old female with history of AS s/p TAVR in 3/20, CAD s/p stent to RCA in 03/2018, HTN, PHTN, DM-2, hypothyroidism, LBBB and GERD admitted with acute shortness of breath, fever and altered mental status.  Reportedly oriented to person and place only on admission.  In the ED, hemodynamically stable except for RR to 31.  Oxygen saturation 93% on 2 L.  Leukocytosis to 16,000.  Lactate of 4.1.  Elevated to 0.98.  CXR, CT head, CTA chest/abdomen/pelvis without acute significant finding.  COVID-19 negative.  Urinalysis negative.  Received 4 L of IV fluid bolus and lactate trended down to 2.4.  Blood cultures drawn.  Started on broad-spectrum antibiotics including acyclovir.  Cardiology consulted.  Patient was admitted for undifferentiated sepsis.  Blood culture grew GPC/Streptococcus species in 3 out of 4.  Antibiotic de-escalated to ceftriaxone and doxycycline. ID consulted.   Subjective: No major events overnight of this morning.  Continues to endorse dry cough.  Dyspnea improved.  Denies chest pain, GI or GU symptoms.  Denies headache, vision change or focal neuro deficit.  Oriented x4- date and year.   Assessment & Plan: Sepsis likely due to community-acquired pneumonia and streptococcal bacteremia.  CXR and CTA chest negative but could be lagging.  Blood culture Streptococcus species likely viridans.  No stigmata of endocarditis.  Procalcitonin and lactic acid elevated but downtrending.  Leukocytosis resolved.  Sepsis physiology resolved.  Clinically improving. -Vancomycin and Zosyn 5/13 -Doxycycline 5/13--.  Patient with prolonged QT although this could be due to LBBB. -Ceftriaxone 5/14-- -ID, Dr. Linus Salmons consulted -acyclovir discontinued on 6/13. -Mucolytic's, PRN DuoNeb, incentive spirometry and flutter valve  -Trend procalcitonin -Follow blood culture speciation -TTE  Acute metabolic encephalopathy: Likely due to the above.  CT head reassuring.  No focal neuro deficits.  Denies headache or nuchal rigidity.  Neuro exam benign.  Mental status improving.  She is oriented x4- date and year. -Management as above  Dyspnea/dry cough: Likely due to community-acquired pneumonia as above -Manage as above  Elevated troponin/history of CAD s/p RCA stent in 03/2018: Patient without chest pain.  No signs of fluid overload.  Elevated troponin likely demand ischemia from sepsis/pneumonia. -Cardiology managing-stopped heparin -Continue home bisoprolol, Plavix and aspirin. -History of statin intolerance.  Could be a candidate for PCSK9 inhibitors. -Discontinue IV fluids -TTE  Hypertension: Normotensive -Continue home this problem. -Hold home losartan and HCTZ for now  Poorly controlled IDDM-2 with hyperglycemia: A1c 8.5%.  On  NPH 120 units daily.  CBG in 70s to 80s.  -Continue Lantus 40 units daily, NovoLog 6 units with meals -Decreased sliding scale to moderate -History of statin intolerance.  Could be a candidate for PCSK-9 inhibitors  Anemia of chronic disease: Hemoglobin 8.6.  MCV 63.  Anemia panel consistent with anemia of chronic disease.  Not sure if she may have underlying hemoglobinopathy.  Hypokalemia: Likely due to IVF -Discontinue IVF -Replenish and recheck  Hypothyroidism: TSH within normal range. -Continue home Synthroid  Depression -Continue home Lexapro and nortriptyline  GERD -PPI  Scheduled Meds: . aspirin  81 mg Oral Daily  . bisoprolol  5 mg Oral Daily  . clopidogrel  75 mg Oral Q breakfast  . escitalopram  20 mg Oral Daily  . guaiFENesin  600 mg Oral BID  . insulin aspart  0-15 Units Subcutaneous TID  WC  . insulin aspart  0-5 Units Subcutaneous QHS  . insulin aspart  6 Units Subcutaneous TID WC  . insulin glargine  40 Units Subcutaneous Daily  . levothyroxine  175  mcg Oral Q0600  . multivitamin with minerals  1 tablet Oral Daily  . nortriptyline  25 mg Oral QHS  . pantoprazole  40 mg Oral BID  . potassium chloride  40 mEq Oral Q4H  . sodium chloride flush  3 mL Intravenous Once   Continuous Infusions: . sodium chloride 50 mL/hr at 10/04/18 1637  . cefTRIAXone (ROCEPHIN)  IV 2 g (10/04/18 2237)  . heparin 1,400 Units/hr (10/04/18 2005)   PRN Meds:.acetaminophen, ipratropium-albuterol   DVT prophylaxis: Lovonex Code Status: Full code Family Communication: Updated patient's husband over the phone again. Disposition Plan: Remains inpatient for sepsis likely due to community-acquired pneumonia, bacteremia and acute metabolic encephalopathy   Consultants:   Cardiology  Infectious disease  Procedures:   None  Microbiology: . COVID-19 negative . Blood culture-Streptococcus species  Antimicrobials: Anti-infectives (From admission, onward)   Start     Dose/Rate Route Frequency Ordered Stop   10/04/18 2230  cefTRIAXone (ROCEPHIN) 2 g in sodium chloride 0.9 % 100 mL IVPB     2 g 200 mL/hr over 30 Minutes Intravenous Every 24 hours 10/04/18 2214     10/04/18 1415  doxycycline (VIBRA-TABS) tablet 100 mg  Status:  Discontinued     100 mg Oral Every 12 hours 10/04/18 1337 10/04/18 2216   10/04/18 1200  vancomycin (VANCOCIN) IVPB 1000 mg/200 mL premix  Status:  Discontinued     1,000 mg 200 mL/hr over 60 Minutes Intravenous Every 12 hours 10/03/18 2341 10/04/18 2215   10/04/18 1000  ceFEPIme (MAXIPIME) 2 g in sodium chloride 0.9 % 100 mL IVPB  Status:  Discontinued     2 g 200 mL/hr over 30 Minutes Intravenous Every 12 hours 10/03/18 2341 10/04/18 0320   10/04/18 0600  piperacillin-tazobactam (ZOSYN) IVPB 3.375 g  Status:  Discontinued     3.375 g 12.5 mL/hr over 240 Minutes Intravenous Every 8 hours 10/04/18 0323 10/04/18 2215   10/04/18 0400  acyclovir (ZOVIRAX) 600 mg in dextrose 5 % 100 mL IVPB  Status:  Discontinued     600 mg 112  mL/hr over 60 Minutes Intravenous Every 8 hours 10/04/18 0323 10/04/18 1337   10/03/18 2245  ceFEPIme (MAXIPIME) 2 g in sodium chloride 0.9 % 100 mL IVPB     2 g 200 mL/hr over 30 Minutes Intravenous  Once 10/03/18 2231 10/04/18 0252   10/03/18 2245  metroNIDAZOLE (FLAGYL) IVPB 500 mg     500 mg 100 mL/hr over 60 Minutes Intravenous  Once 10/03/18 2231 10/04/18 0136   10/03/18 2245  vancomycin (VANCOCIN) IVPB 1000 mg/200 mL premix  Status:  Discontinued     1,000 mg 200 mL/hr over 60 Minutes Intravenous  Once 10/03/18 2231 10/03/18 2235   10/03/18 2245  vancomycin (VANCOCIN) 2,000 mg in sodium chloride 0.9 % 500 mL IVPB     2,000 mg 250 mL/hr over 120 Minutes Intravenous  Once 10/03/18 2235 10/04/18 0354      Objective: Vitals:   10/04/18 1959 10/04/18 2217 10/05/18 0029 10/05/18 0329  BP: (!) 137/56 (!) 149/70 139/66 (!) 147/71  Pulse: 70 66 62 69  Resp: (!) 22 (!) 22 (!) 23 20  Temp: 97.7 F (36.5 C) 97.6 F (36.4 C) 97.7 F (36.5 C) 98.4 F (36.9 C)  TempSrc: Oral Oral Oral  Oral  SpO2: 96% 99% 99% 98%  Weight:    92.4 kg  Height:        Intake/Output Summary (Last 24 hours) at 10/05/2018 0757 Last data filed at 10/05/2018 0300 Gross per 24 hour  Intake 1699.06 ml  Output 1352 ml  Net 347.06 ml   Filed Weights   10/03/18 2300 10/04/18 0419 10/05/18 0329  Weight: 91.6 kg 92.7 kg 92.4 kg    Examination:  GENERAL: No acute distress.  Appears well.  HEENT: MMM.  Vision and hearing grossly intact.  NECK: Supple.  No apparent JVD.  No nuchal rigidity. LUNGS:  No IWOB. Good air movement bilaterally. HEART:  RRR. Heart sounds normal.  No murmurs. ABD: Bowel sounds present. Soft. Non tender.  MSK/EXT:  Moves all extremities. No apparent deformity. No edema bilaterally.  No stigmata of endocarditis. SKIN: no apparent skin lesion or wound NEURO: Awake, alert and oriented x4- date and months.  Otherwise, neuro exam within normal range. PSYCH: Calm. Normal affect.  Data  Reviewed: I have independently reviewed following labs and imaging studies   CBC: Recent Labs  Lab 10/03/18 2136 10/04/18 0750 10/05/18 0433  WBC 16.2* 12.9* 8.4  NEUTROABS 14.6*  --   --   HGB 10.6* 9.3* 8.6*  HCT 33.9* 31.0* 28.4*  MCV 61.4* 62.5* 62.1*  PLT 208 167 237*   Basic Metabolic Panel: Recent Labs  Lab 10/03/18 2136 10/04/18 0750 10/05/18 0433  NA 132* 135 138  K 3.9 3.6 3.3*  CL 99 105 108  CO2 19* 20* 21*  GLUCOSE 143* 147* 110*  BUN 25* 21 11  CREATININE 1.02* 0.91 0.74  CALCIUM 9.6 8.4* 8.9  MG  --   --  2.0   GFR: Estimated Creatinine Clearance: 69.3 mL/min (by C-G formula based on SCr of 0.74 mg/dL). Liver Function Tests: Recent Labs  Lab 10/03/18 2136 10/04/18 0750 10/05/18 0433  AST 46* 38 34  ALT 41 30 26  ALKPHOS 85 78 66  BILITOT 1.9* 1.5* 0.9  PROT 7.3 6.1* 6.0*  ALBUMIN 3.1* 2.7* 2.6*   No results for input(s): LIPASE, AMYLASE in the last 168 hours. No results for input(s): AMMONIA in the last 168 hours. Coagulation Profile: Recent Labs  Lab 10/03/18 2136 10/04/18 0750  INR 1.3* 1.5*   Cardiac Enzymes: Recent Labs  Lab 10/03/18 2136 10/04/18 0100 10/04/18 0440  TROPONINI 0.98* 0.91* 0.83*   BNP (last 3 results) Recent Labs    02/19/18 1233  PROBNP 270   HbA1C: Recent Labs    10/05/18 0433  HGBA1C 8.5*   CBG: Recent Labs  Lab 10/04/18 0732 10/04/18 1602 10/04/18 2207 10/05/18 0334 10/05/18 0637  GLUCAP 126* 125* 75 86 89   Lipid Profile: No results for input(s): CHOL, HDL, LDLCALC, TRIG, CHOLHDL, LDLDIRECT in the last 72 hours. Thyroid Function Tests: Recent Labs    10/04/18 0440  TSH 2.858   Anemia Panel: Recent Labs    10/05/18 0710  RETICCTPCT 2.2   Urine analysis:    Component Value Date/Time   COLORURINE YELLOW 10/04/2018 0112   APPEARANCEUR CLEAR 10/04/2018 0112   LABSPEC 1.029 10/04/2018 0112   PHURINE 6.0 10/04/2018 0112   GLUCOSEU NEGATIVE 10/04/2018 0112   HGBUR NEGATIVE  10/04/2018 0112   HGBUR negative 12/13/2008 1035   BILIRUBINUR NEGATIVE 10/04/2018 0112   BILIRUBINUR neg 04/10/2018 1519   KETONESUR NEGATIVE 10/04/2018 0112   PROTEINUR NEGATIVE 10/04/2018 0112   UROBILINOGEN 0.2 04/10/2018 1519   UROBILINOGEN 0.2 09/18/2011  2049   NITRITE NEGATIVE 10/04/2018 0112   LEUKOCYTESUR NEGATIVE 10/04/2018 0112   Sepsis Labs: Invalid input(s): PROCALCITONIN, LACTICIDVEN  Recent Results (from the past 240 hour(s))  Culture, blood (Routine x 2)     Status: None (Preliminary result)   Collection Time: 10/03/18  9:30 PM   Specimen: BLOOD RIGHT HAND  Result Value Ref Range Status   Specimen Description BLOOD RIGHT HAND  Final   Special Requests   Final    BOTTLES DRAWN AEROBIC AND ANAEROBIC Blood Culture results may not be optimal due to an excessive volume of blood received in culture bottles   Culture  Setup Time   Final    GRAM POSITIVE COCCI IN CHAINS IN BOTH AEROBIC AND ANAEROBIC BOTTLES CRITICAL VALUE NOTED.  VALUE IS CONSISTENT WITH PREVIOUSLY REPORTED AND CALLED VALUE. Performed at [facility], [place] [address], [place], [place] [address]    Culture PENDING  Incomplete   Report Status PENDING  Incomplete  Culture, blood (Routine x 2)     Status: None (Preliminary result)   Collection Time: 10/03/18  9:40 PM   Specimen: BLOOD LEFT HAND  Result Value Ref Range Status   Specimen Description BLOOD LEFT HAND  Final   Special Requests   Final    BOTTLES DRAWN AEROBIC ONLY Blood Culture results may not be optimal due to an excessive volume of blood received in culture bottles   Culture  Setup Time   Final    GRAM POSITIVE COCCI IN CHAINS AEROBIC BOTTLE ONLY Organism ID to follow CRITICAL RESULT CALLED TO, READ BACK BY AND VERIFIED WITH: RHRMD L SEAY @2211  10/04/18 BY S GEZAHEGN Performed at [facility], [place] [address], [place], [place] [address]    Culture PENDING  Incomplete   Report Status PENDING  Incomplete  Blood Culture ID Panel  (Reflexed)     Status: Abnormal   Collection Time: 10/03/18  9:40 PM  Result Value Ref Range Status   Enterococcus species NOT DETECTED NOT DETECTED Final   Listeria monocytogenes NOT DETECTED NOT DETECTED Final   Staphylococcus species NOT DETECTED NOT DETECTED Final   Staphylococcus aureus (BCID) NOT DETECTED NOT DETECTED Final   Streptococcus species DETECTED (A) NOT DETECTED Final    Comment: Not Enterococcus species, Streptococcus agalactiae, Streptococcus pyogenes, or Streptococcus pneumoniae. CRITICAL RESULT CALLED TO, READ BACK BY AND VERIFIED WITH: PHRMD L SEAY @2211  10/04/18 BY S GEZAHEGN    Streptococcus agalactiae NOT DETECTED NOT DETECTED Final   Streptococcus pneumoniae NOT DETECTED NOT DETECTED Final   Streptococcus pyogenes NOT DETECTED NOT DETECTED Final   Acinetobacter baumannii NOT DETECTED NOT DETECTED Final   Enterobacteriaceae species NOT DETECTED NOT DETECTED Final   Enterobacter cloacae complex NOT DETECTED NOT DETECTED Final   Escherichia coli NOT DETECTED NOT DETECTED Final   Klebsiella oxytoca NOT DETECTED NOT DETECTED Final   Klebsiella pneumoniae NOT DETECTED NOT DETECTED Final   Proteus species NOT DETECTED NOT DETECTED Final   Serratia marcescens NOT DETECTED NOT DETECTED Final   Haemophilus influenzae NOT DETECTED NOT DETECTED Final   Neisseria meningitidis NOT DETECTED NOT DETECTED Final   Pseudomonas aeruginosa NOT DETECTED NOT DETECTED Final   Candida albicans NOT DETECTED NOT DETECTED Final   Candida glabrata NOT DETECTED NOT DETECTED Final   Candida krusei NOT DETECTED NOT DETECTED Final   Candida parapsilosis NOT DETECTED NOT DETECTED Final   Candida tropicalis NOT DETECTED NOT DETECTED Final    Comment: Performed at [facility],  [address], [place], [place] [address]  SARS Coronavirus 2     Status: None   Collection Time: 10/03/18 10:28 PM  Result Value Ref Range Status   SARS Coronavirus 2 NOT DETECTED NOT DETECTED Final     Comment: (NOTE) SARS-CoV-2 target nucleic acids are NOT DETECTED. The SARS-CoV-2 RNA is generally detectable in upper and lower respiratory specimens during the acute phase of infection.  Negative  results do not preclude SARS-CoV-2 infection, do not rule out co-infections with other pathogens, and should not be used as the sole basis for treatment or other patient management decisions.  Negative results must be combined with clinical observations, patient history, and epidemiological information. The expected result is Not Detected. Fact Sheet for Patients: http://www.biofiredefense.com/wp-content/uploads/2020/03/BIOFIRE-COVID -19-patients.pdf Fact Sheet for Healthcare Providers: http://www.biofiredefense.com/wp-content/uploads/2020/03/BIOFIRE-COVID -19-hcp.pdf This test is not yet approved or cleared by the [place] and  has been authorized for detection and/or diagnosis of SARS-CoV-2 by FDA under an Emergency Use Authorization (EUA).  This EUA will remain in effec t (meaning this test can be used) for the duration of  the COVID-19 declaration under Section 564(b)(1) of the Act, 21 U.S.C. section 360bbb-3(b)(1), unless the authorization is terminated or revoked sooner. Performed at [facility], [place] [address], [place], [place] [address]       Radiology Studies: No results found.  35 minutes with more than 50% spent in reviewing records, counseling patient and coordinating care.   T. [facility] Pager [phone]  If 7PM-7AM, please contact night-coverage [email or web address] Password [facility] 10/05/2018, 7:57 AM

## 2018-10-05 NOTE — Progress Notes (Signed)
Update given to husband, [name], over the phone.

## 2018-10-05 NOTE — Progress Notes (Signed)
  Echocardiogram 2D Echocardiogram has been performed.  Breanna Webster 10/05/2018, 6:10 PM

## 2018-10-05 NOTE — Plan of Care (Signed)
Care plan progressing

## 2018-10-05 NOTE — Progress Notes (Signed)
ANTICOAGULATION CONSULT NOTE - Follow Up Consult  Pharmacy Consult for heparin Indication: chest pain/ACS  Allergies  Allergen Reactions  . Statins Other (See Comments)    Muscle aches    Patient Measurements: Height: 5' 5"  (165.1 cm) Weight: 203 lb 11.3 oz (92.4 kg) IBW/kg (Calculated) : 57 Heparin Dosing Weight: 77.4 kg  Vital Signs: Temp: 98.4 F (36.9 C) (06/14 0329) Temp Source: Oral (06/14 0329) BP: 147/71 (06/14 0329) Pulse Rate: 69 (06/14 0329)  Labs: Recent Labs    10/03/18 2136 10/04/18 0100 10/04/18 0440 10/04/18 0750 10/04/18 1929 10/05/18 0433  HGB 10.6*  --   --  9.3*  --  8.6*  HCT 33.9*  --   --  31.0*  --  28.4*  PLT 208  --   --  167  --  146*  LABPROT 15.9*  --   --  18.1*  --   --   INR 1.3*  --   --  1.5*  --   --   HEPARINUNFRC  --   --   --  0.19* 0.26* 0.47  CREATININE 1.02*  --   --  0.91  --  0.74  TROPONINI 0.98* 0.91* 0.83*  --   --   --     Estimated Creatinine Clearance: 69.3 mL/min (by C-G formula based on SCr of 0.74 mg/dL).   Assessment: 73yo female c/o SOB and fever, troponin found to be elevated. Pharmacy consulted for heparin. Patient not on anticoagulation PTA.  Heparin level therapeutic this AM. CBC slightly decreased this AM, Hgb 8.6/HCT 28.4 and pltc 146. No bleeding or issues with heparin infusion per RN.  Goal of Therapy:  Heparin level 0.3-0.7 units/ml Monitor platelets by anticoagulation protocol: Yes   Plan:  Continue heparin 1400 units/hr Check daily heparin level and CBC Continue to monitor H&H and platelets  Thank you for allowing pharmacy to be a part of this patient's care.  Leron Croak, PharmD PGY1 Pharmacy Resident Phone: [phone]  Please check AMION for all [facility] phone numbers 10/05/2018,7:30 AM

## 2018-10-05 NOTE — Progress Notes (Signed)
Progress Note   Subjective   Doing well today, the patient denies CP or SOB.  No new concerns  Inpatient Medications    Scheduled Meds: . aspirin  81 mg Oral Daily  . bisoprolol  5 mg Oral Daily  . clopidogrel  75 mg Oral Q breakfast  . doxycycline  100 mg Oral Q12H  . escitalopram  20 mg Oral Daily  . guaiFENesin  600 mg Oral BID  . insulin aspart  0-15 Units Subcutaneous TID WC  . insulin aspart  0-5 Units Subcutaneous QHS  . insulin aspart  6 Units Subcutaneous TID WC  . insulin glargine  40 Units Subcutaneous Daily  . levothyroxine  175 mcg Oral Q0600  . multivitamin with minerals  1 tablet Oral Daily  . nortriptyline  25 mg Oral QHS  . pantoprazole  40 mg Oral BID  . potassium chloride  40 mEq Oral Q4H  . sodium chloride flush  3 mL Intravenous Once   Continuous Infusions: . sodium chloride 50 mL/hr at 10/05/18 0852  . cefTRIAXone (ROCEPHIN)  IV 2 g (10/04/18 2237)  . heparin 1,400 Units/hr (10/04/18 2005)   PRN Meds: acetaminophen, ipratropium-albuterol   Vital Signs    Vitals:   10/04/18 2217 10/05/18 0029 10/05/18 0329 10/05/18 0847  BP: (!) 149/70 139/66 (!) 147/71 137/65  Pulse: 66 62 69 72  Resp: (!) 22 (!) 23 20   Temp: 97.6 F (36.4 C) 97.7 F (36.5 C) 98.4 F (36.9 C) 98.9 F (37.2 C)  TempSrc: Oral Oral Oral Oral  SpO2: 99% 99% 98% 95%  Weight:   92.4 kg   Height:        Intake/Output Summary (Last 24 hours) at 10/05/2018 0902 Last data filed at 10/05/2018 0300 Gross per 24 hour  Intake 1699.06 ml  Output 1352 ml  Net 347.06 ml   Filed Weights   10/03/18 2300 10/04/18 0419 10/05/18 0329  Weight: 91.6 kg 92.7 kg 92.4 kg    Telemetry    sinus - Personally Reviewed  Physical Exam   GEN- The patient is elderly appearing, alert and oriented x 3 today.  Coughs frequently on exam Head- normocephalic, atraumatic Eyes-  Sclera clear, conjunctiva pink Ears- hearing intact Oropharynx- clear Neck- supple, Lungs- Clear to ausculation  bilaterally, normal work of breathing Heart- Regular rate and rhythm  GI- soft, NT, ND, + BS Extremities- no clubbing, cyanosis, or edema  MS- no significant deformity or atrophy Skin- no rash or lesion Psych- euthymic mood, full affect Neuro- strength and sensation are intact   Labs    Chemistry Recent Labs  Lab 10/03/18 2136 10/04/18 0750 10/05/18 0433  NA 132* 135 138  K 3.9 3.6 3.3*  CL 99 105 108  CO2 19* 20* 21*  GLUCOSE 143* 147* 110*  BUN 25* 21 11  CREATININE 1.02* 0.91 0.74  CALCIUM 9.6 8.4* 8.9  PROT 7.3 6.1* 6.0*  ALBUMIN 3.1* 2.7* 2.6*  AST 46* 38 34  ALT 41 30 26  ALKPHOS 85 78 66  BILITOT 1.9* 1.5* 0.9  GFRNONAA 54* >60 >60  GFRAA >60 >60 >60  ANIONGAP 14 10 9      Hematology Recent Labs  Lab 10/03/18 2136 10/04/18 0750 10/05/18 0433 10/05/18 0710  WBC 16.2* 12.9* 8.4  --   RBC 5.52* 4.96 4.57 4.99  HGB 10.6* 9.3* 8.6*  --   HCT 33.9* 31.0* 28.4*  --   MCV 61.4* 62.5* 62.1*  --   MCH 19.2* 18.8*  18.8*  --   MCHC 31.3 30.0 30.3  --   RDW 15.9* 15.8* 15.9*  --   PLT 208 167 146*  --     Cardiac Enzymes Recent Labs  Lab 10/03/18 2136 10/04/18 0100 10/04/18 0440  TROPONINI 0.98* 0.91* 0.83*   No results for input(s): TROPIPOC in the last 168 hours.     ekg reveals sinus with LBBB   Patient Profile    Breanna Webster is a 73 y.o. female with a history of CAD, s/p PCI in 2019, AS s/p TAVR.  Now admitted with sepsis/ pneumonia and positive troponin.  Assessment & Plan    1.  Elevated troponin Likely demand ischemia from underlying medical illness Unlikely thrombotic CAD event Given anemia, will stop heparin.  Continue ASA and plavix for now No further CV intervention planned unless she develops worsening symptoms Echo pending  2. Known CAD S/p RCA PCI in December Continue medical therapy  3. Sepsis/ CAP Per primary team  4. Metabolic encephalopathy Improving  5. HTN Stable No change required today   Thompson Grayer  MD, [facility] 10/05/2018 9:02 AM

## 2018-10-05 NOTE — Evaluation (Signed)
Physical Therapy Evaluation Patient Details Name: Breanna Webster MRN: [id number] DOB: 1945-12-20 Today's Date: 10/05/2018   History of Present Illness  73 y.o. female with medical history significant of TAVR 06/2018, CAD, HTN, DM2.  Patient admitted with c/o SOB, AMS and sepsis with elevated troponin due to demand ischemia  Clinical Impression  Pt pleasant but noted to have decreased cognition, problem solving, awareness and orientation. Pt admitted with above and demonstrates decreased balance, transfers and function who will benefit from acute therapy to maximize mobility, function and safety.      Follow Up Recommendations Home health PT;Supervision/Assistance - 24 hour    Equipment Recommendations  None recommended by PT    Recommendations for Other Services OT consult     Precautions / Restrictions Precautions Precautions: Fall      Mobility  Bed Mobility Overal bed mobility: Modified Independent             General bed mobility comments: HOB 20 degrees  Transfers Overall transfer level: Needs assistance   Transfers: Sit to/from Stand Sit to Stand: Supervision         General transfer comment: supervision for safety  Ambulation/Gait Ambulation/Gait assistance: Min guard Gait Distance (Feet): 120 Feet Assistive device: Rolling walker (2 wheeled) Gait Pattern/deviations: Step-through pattern;Decreased stride length;Trunk flexed   Gait velocity interpretation: 1.31 - 2.62 ft/sec, indicative of limited community ambulator General Gait Details: cues for posture, hand position on RW, proximity to RW, verbal cues to avoid obstacles with min assist at times to clear, and cues for direction  Stairs            Wheelchair Mobility    Modified Rankin (Stroke Patients Only)       Balance Overall balance assessment: Needs assistance   Sitting balance-Leahy Scale: Good     Standing balance support: Bilateral upper extremity supported Standing  balance-Leahy Scale: Poor                               Pertinent Vitals/Pain Pain Assessment: No/denies pain    Home Living Family/patient expects to be discharged to:: Private residence Living Arrangements: Spouse/significant other Available Help at Discharge: Family;Available 24 hours/day Type of Home: House Home Access: Stairs to enter Entrance Stairs-Rails: Psychiatric nurse of Steps: 4 Home Layout: One level Home Equipment: Environmental consultant - 2 wheels      Prior Function Level of Independence: Independent               Hand Dominance        Extremity/Trunk Assessment   Upper Extremity Assessment Upper Extremity Assessment: Overall WFL for tasks assessed    Lower Extremity Assessment Lower Extremity Assessment: Generalized weakness    Cervical / Trunk Assessment Cervical / Trunk Assessment: Normal  Communication   Communication: No difficulties  Cognition Arousal/Alertness: Awake/alert Behavior During Therapy: WFL for tasks assessed/performed Overall Cognitive Status: Impaired/Different from baseline Area of Impairment: Memory;Problem solving;Safety/judgement;Orientation                 Orientation Level: Time;Disoriented to   Memory: Decreased short-term memory   Safety/Judgement: Decreased awareness of deficits;Decreased awareness of safety   Problem Solving: Slow processing General Comments: pt not oriented to day, unable to recall room number x 3 and when she finally did recall it was trying to enter room 12 instead of 10.      General Comments      Exercises  Assessment/Plan    PT Assessment Patient needs continued PT services  PT Problem List Decreased strength;Decreased mobility;Decreased safety awareness;Decreased activity tolerance;Decreased cognition;Decreased balance;Decreased knowledge of use of DME       PT Treatment Interventions Gait training;Therapeutic exercise;Patient/family education;Stair  training;Balance training;Functional mobility training;DME instruction;Therapeutic activities;Cognitive remediation    PT Goals (Current goals can be found in the Care Plan section)  Acute Rehab PT Goals Patient Stated Goal: return home and watch tv PT Goal Formulation: With patient Time For Goal Achievement: 10/19/18 Potential to Achieve Goals: Good    Frequency Min 3X/week   Barriers to discharge        Co-evaluation               AM-PAC PT "6 Clicks" Mobility  Outcome Measure Help needed turning from your back to your side while in a flat bed without using bedrails?: None Help needed moving from lying on your back to sitting on the side of a flat bed without using bedrails?: A Little Help needed moving to and from a bed to a chair (including a wheelchair)?: A Little Help needed standing up from a chair using your arms (e.g., wheelchair or bedside chair)?: A Little Help needed to walk in hospital room?: A Little Help needed climbing 3-5 steps with a railing? : A Little 6 Click Score: 19    End of Session Equipment Utilized During Treatment: Gait belt Activity Tolerance: Patient tolerated treatment well Patient left: in chair;with call bell/phone within reach;with chair alarm set Nurse Communication: Mobility status PT Visit Diagnosis: Other abnormalities of gait and mobility (R26.89);Muscle weakness (generalized) (M62.81)    Time: 5271-2929 PT Time Calculation (min) (ACUTE ONLY): 14 min   Charges:   PT Evaluation $PT Eval Moderate Complexity: 1 Mod          [place], PT Acute Rehabilitation Services Pager: [phone] Office: [phone]   Sandy Salaam Ketina Mars 10/05/2018, 12:34 PM

## 2018-10-06 LAB — CBC
HCT: 29.2 % — ABNORMAL LOW (ref 36.0–46.0)
Hemoglobin: 9 g/dL — ABNORMAL LOW (ref 12.0–15.0)
MCH: 19.1 pg — ABNORMAL LOW (ref 26.0–34.0)
MCHC: 30.8 g/dL (ref 30.0–36.0)
MCV: 62.1 fL — ABNORMAL LOW (ref 80.0–100.0)
Platelets: 175 10*3/uL (ref 150–400)
RBC: 4.7 MIL/uL (ref 3.87–5.11)
RDW: 15.9 % — ABNORMAL HIGH (ref 11.5–15.5)
WBC: 7.3 10*3/uL (ref 4.0–10.5)
nRBC: 0.3 % — ABNORMAL HIGH (ref 0.0–0.2)

## 2018-10-06 LAB — BASIC METABOLIC PANEL
Anion gap: 8 (ref 5–15)
BUN: 6 mg/dL — ABNORMAL LOW (ref 8–23)
CO2: 23 mmol/L (ref 22–32)
Calcium: 9.4 mg/dL (ref 8.9–10.3)
Chloride: 107 mmol/L (ref 98–111)
Creatinine, Ser: 0.69 mg/dL (ref 0.44–1.00)
GFR calc Af Amer: >60 mL/min (ref >60)
GFR calc non Af Amer: >60 mL/min (ref >60)
Glucose, Bld: 115 mg/dL — ABNORMAL HIGH (ref 70–99)
Potassium: 3.5 mmol/L (ref 3.5–5.1)
Sodium: 138 mmol/L (ref 135–145)

## 2018-10-06 LAB — GLUCOSE, CAPILLARY
Glucose-Capillary: 128 mg/dL — ABNORMAL HIGH (ref 70–99)
Glucose-Capillary: 107 mg/dL — ABNORMAL HIGH (ref 70–99)
Glucose-Capillary: 185 mg/dL — ABNORMAL HIGH (ref 70–99)
Glucose-Capillary: 144 mg/dL — ABNORMAL HIGH (ref 70–99)
Glucose-Capillary: 118 mg/dL — ABNORMAL HIGH (ref 70–99)

## 2018-10-06 LAB — MAGNESIUM: Magnesium: 1.9 mg/dL (ref 1.7–2.4)

## 2018-10-06 LAB — PROCALCITONIN: Procalcitonin: 2.04 ng/mL

## 2018-10-06 MED ORDER — SODIUM CHLORIDE 0.9 % IV SOLN
INTRAVENOUS | Status: DC
  Administered 2018-10-06 – 2018-10-07 (×2): via INTRAVENOUS

## 2018-10-06 NOTE — Progress Notes (Signed)
PROGRESS NOTE  Breanna Webster WGN:[id number] DOB: 1945-05-19 DOA: 10/03/2018 PCP: [name], NP   LOS: 2 days   Patient is from: Home  Brief Narrative / Interim history: 73 year old female with history of AS s/p TAVR in 3/20, CAD s/p stent to RCA in 03/2018, HTN, PHTN, DM-2, hypothyroidism, LBBB and GERD admitted with acute shortness of breath, fever and altered mental status.  Reportedly oriented to person and place only on admission.  In the ED, hemodynamically stable except for RR to 31.  Oxygen saturation 93% on 2 L.  Leukocytosis to 16,000.  Lactate of 4.1.  Elevated to 0.98.  CXR, CT head, CTA chest/abdomen/pelvis without acute significant finding.  COVID-19 negative.  Urinalysis negative.  Received 4 L of IV fluid bolus and lactate trended down to 2.4.  Blood cultures drawn.  Started on broad-spectrum antibiotics including acyclovir.  Cardiology consulted.  Patient was admitted for undifferentiated sepsis.  Blood culture grew GPC/Streptococcus species in 3 out of 4.  Antibiotic de-escalated to ceftriaxone .  Doxycycline discontinued.    Subjective: No major events overnight of this morning.  She is a sleepy but arises easily.  No complaint other than dry cough.  Denies nausea, vomiting, abdominal pain, shortness of breath or chest pain.  Denies GU symptoms.   Assessment & Plan: Sepsis likely due to streptococcal bacteremia and patient with TAVR.   -Admitted with acute encephalopathy, SOB and dry cough -CXR and CTA chest without acute finding. -Blood culture on 6/12 Streptococcus salivarius-sensitivity pending. -Repeat blood culture on 6/15 pending. -No stigmata of endocarditis but patient with recent TAVR -Lactic acid and leukocytosis resolved. -Vancomycin and Zosyn 5/13 -Doxycycline 5/13-5/14.  -Ceftriaxone 5/14-- -ID following. -Acyclovir discontinued on 6/13. -Trend procalcitonin-downtrending significantly -Follow TTE -Cardiology consulted for TEE.  Acute  metabolic encephalopathy: Likely due to the above.  CT head reassuring.  No focal neuro deficits.  Denies headache or nuchal rigidity.  Neuro exam benign.  Mental status improved -Management as above  Dyspnea/dry cough: CAP? -Patient presented with AMS, SOB and dry cough concerning for respiratory infection -CXR and CTA chest negative but could be lagging.   -Antibiotic as above -Mucolytic's, PRN DuoNeb, incentive spirometry and flutter valve  Elevated troponin/history of CAD s/p RCA stent in 03/2018: Patient without chest pain.  No signs of fluid overload.  Elevated troponin likely demand ischemia from sepsis/pneumonia. -Cardiology managing-stopped heparin -Continue home bisoprolol, Plavix and aspirin. -History of statin intolerance.  Could be a candidate for PCSK9 inhibitors. -Follow TTE -Plan for TEE  Hypertension: Normotensive -Continue home this problem. -Hold home losartan and HCTZ for now  Poorly controlled IDDM-2 with hyperglycemia: A1c 8.5%.  On  NPH 120 units daily.  CBG in 70s to 80s.  -Continue Lantus 40 units daily, NovoLog 6 units with meals -Decreased sliding scale to moderate -History of statin intolerance.  Could be a candidate for PCSK-9 inhibitors  Anemia of chronic disease: Hemoglobin 8.6.  MCV 63.  Anemia panel consistent with anemia of chronic disease.  Not sure if she may have underlying hemoglobinopathy.  Hypokalemia: Likely due to IVF.  Resolved.  Hypothyroidism: TSH within normal range. -Continue home Synthroid  Depression -Continue home Lexapro and nortriptyline  GERD -PPI  Scheduled Meds: . aspirin  81 mg Oral Daily  . bisoprolol  5 mg Oral Daily  . clopidogrel  75 mg Oral Q breakfast  . enoxaparin (LOVENOX) injection  40 mg Subcutaneous Q24H  . escitalopram  20 mg Oral Daily  . guaiFENesin  600 mg  Oral BID  . insulin aspart  0-15 Units Subcutaneous TID WC  . insulin aspart  0-5 Units Subcutaneous QHS  . insulin aspart  6 Units Subcutaneous  TID WC  . insulin glargine  40 Units Subcutaneous Daily  . levothyroxine  175 mcg Oral Q0600  . multivitamin with minerals  1 tablet Oral Daily  . nortriptyline  25 mg Oral QHS  . pantoprazole  40 mg Oral BID  . sodium chloride flush  3 mL Intravenous Once   Continuous Infusions: . cefTRIAXone (ROCEPHIN)  IV 2 g (10/05/18 2151)   PRN Meds:.acetaminophen, ipratropium-albuterol   DVT prophylaxis: Lovonex Code Status: Full code Family Communication: Updated patient's husband over the phone 6/14. Disposition Plan: Remains inpatient for Streptococcus bacteremia work-up and treatment. Consultants:   Cardiology  Infectious disease  Procedures:   None  Microbiology: . COVID-19 negative . Blood culture-Streptococcus species  Antimicrobials: Anti-infectives (From admission, onward)   Start     Dose/Rate Route Frequency Ordered Stop   10/05/18 1000  doxycycline (VIBRA-TABS) tablet 100 mg  Status:  Discontinued     100 mg Oral Every 12 hours 10/05/18 0813 10/05/18 1300   10/04/18 2230  cefTRIAXone (ROCEPHIN) 2 g in sodium chloride 0.9 % 100 mL IVPB     2 g 200 mL/hr over 30 Minutes Intravenous Every 24 hours 10/04/18 2214     10/04/18 1415  doxycycline (VIBRA-TABS) tablet 100 mg  Status:  Discontinued     100 mg Oral Every 12 hours 10/04/18 1337 10/04/18 2216   10/04/18 1200  vancomycin (VANCOCIN) IVPB 1000 mg/200 mL premix  Status:  Discontinued     1,000 mg 200 mL/hr over 60 Minutes Intravenous Every 12 hours 10/03/18 2341 10/04/18 2215   10/04/18 1000  ceFEPIme (MAXIPIME) 2 g in sodium chloride 0.9 % 100 mL IVPB  Status:  Discontinued     2 g 200 mL/hr over 30 Minutes Intravenous Every 12 hours 10/03/18 2341 10/04/18 0320   10/04/18 0600  piperacillin-tazobactam (ZOSYN) IVPB 3.375 g  Status:  Discontinued     3.375 g 12.5 mL/hr over 240 Minutes Intravenous Every 8 hours 10/04/18 0323 10/04/18 2215   10/04/18 0400  acyclovir (ZOVIRAX) 600 mg in dextrose 5 % 100 mL IVPB   Status:  Discontinued     600 mg 112 mL/hr over 60 Minutes Intravenous Every 8 hours 10/04/18 0323 10/04/18 1337   10/03/18 2245  ceFEPIme (MAXIPIME) 2 g in sodium chloride 0.9 % 100 mL IVPB     2 g 200 mL/hr over 30 Minutes Intravenous  Once 10/03/18 2231 10/04/18 0252   10/03/18 2245  metroNIDAZOLE (FLAGYL) IVPB 500 mg     500 mg 100 mL/hr over 60 Minutes Intravenous  Once 10/03/18 2231 10/04/18 0136   10/03/18 2245  vancomycin (VANCOCIN) IVPB 1000 mg/200 mL premix  Status:  Discontinued     1,000 mg 200 mL/hr over 60 Minutes Intravenous  Once 10/03/18 2231 10/03/18 2235   10/03/18 2245  vancomycin (VANCOCIN) 2,000 mg in sodium chloride 0.9 % 500 mL IVPB     2,000 mg 250 mL/hr over 120 Minutes Intravenous  Once 10/03/18 2235 10/04/18 0354      Objective: Vitals:   10/05/18 0329 10/05/18 0847 10/05/18 2000 10/06/18 0500  BP: (!) 147/71 137/65 (!) 122/49 128/61  Pulse: 69 72 67 63  Resp: 20  18   Temp: 98.4 F (36.9 C) 98.9 F (37.2 C) 98.2 F (36.8 C) 97.9 F (36.6 C)  TempSrc: Oral  Oral Oral Oral  SpO2: 98% 95% 95% 99%  Weight: 92.4 kg     Height:        Intake/Output Summary (Last 24 hours) at 10/06/2018 1107 Last data filed at 10/06/2018 0300 Gross per 24 hour  Intake 100 ml  Output -  Net 100 ml   Filed Weights   10/03/18 2300 10/04/18 0419 10/05/18 0329  Weight: 91.6 kg 92.7 kg 92.4 kg    Examination:  GENERAL: No acute distress.  Appears well.  HEENT: MMM.  Vision and hearing grossly intact.  NECK: Supple.  No apparent JVD.  No nuchal rigidity. LUNGS:  No IWOB. Good air movement bilaterally. HEART:  RRR. Heart sounds normal.  No murmurs. ABD: Bowel sounds present. Soft. Non tender.  MSK/EXT:  Moves all extremities. No apparent deformity. No edema bilaterally.  No stigmata of endocarditis. SKIN: no apparent skin lesion or wound NEURO: Awake, alert and oriented x4- date and months.  Otherwise, neuro exam within normal range. PSYCH: Calm. Normal affect.   Data Reviewed: I have independently reviewed following labs and imaging studies   CBC: Recent Labs  Lab 10/03/18 2136 10/04/18 0750 10/05/18 0433 10/06/18 0631  WBC 16.2* 12.9* 8.4 7.3  NEUTROABS 14.6*  --   --   --   HGB 10.6* 9.3* 8.6* 9.0*  HCT 33.9* 31.0* 28.4* 29.2*  MCV 61.4* 62.5* 62.1* 62.1*  PLT 208 167 146* 427   Basic Metabolic Panel: Recent Labs  Lab 10/03/18 2136 10/04/18 0750 10/05/18 0433 10/06/18 0631  NA 132* 135 138 138  K 3.9 3.6 3.3* 3.5  CL 99 105 108 107  CO2 19* 20* 21* 23  GLUCOSE 143* 147* 110* 115*  BUN 25* 21 11 6*  CREATININE 1.02* 0.91 0.74 0.69  CALCIUM 9.6 8.4* 8.9 9.4  MG  --   --  2.0 1.9   GFR: Estimated Creatinine Clearance: 69.3 mL/min (by C-G formula based on SCr of 0.69 mg/dL). Liver Function Tests: Recent Labs  Lab 10/03/18 2136 10/04/18 0750 10/05/18 0433  AST 46* 38 34  ALT 41 30 26  ALKPHOS 85 78 66  BILITOT 1.9* 1.5* 0.9  PROT 7.3 6.1* 6.0*  ALBUMIN 3.1* 2.7* 2.6*   No results for input(s): LIPASE, AMYLASE in the last 168 hours. No results for input(s): AMMONIA in the last 168 hours. Coagulation Profile: Recent Labs  Lab 10/03/18 2136 10/04/18 0750  INR 1.3* 1.5*   Cardiac Enzymes: Recent Labs  Lab 10/03/18 2136 10/04/18 0100 10/04/18 0440  TROPONINI 0.98* 0.91* 0.83*   BNP (last 3 results) Recent Labs    02/19/18 1233  PROBNP 270   HbA1C: Recent Labs    10/05/18 0433  HGBA1C 8.5*   CBG: Recent Labs  Lab 10/05/18 0637 10/05/18 1123 10/05/18 1627 10/05/18 2120 10/06/18 0606  GLUCAP 89 135* 131* 70 107*   Lipid Profile: No results for input(s): CHOL, HDL, LDLCALC, TRIG, CHOLHDL, LDLDIRECT in the last 72 hours. Thyroid Function Tests: Recent Labs    10/04/18 0440  TSH 2.858   Anemia Panel: Recent Labs    10/05/18 0710  VITAMINB12 877  FOLATE 22.3  FERRITIN 24  TIBC 281  IRON 55  RETICCTPCT 2.2   Urine analysis:    Component Value Date/Time   COLORURINE YELLOW  10/04/2018 0112   APPEARANCEUR CLEAR 10/04/2018 0112   LABSPEC 1.029 10/04/2018 0112   PHURINE 6.0 10/04/2018 0112   GLUCOSEU NEGATIVE 10/04/2018 0112   HGBUR NEGATIVE 10/04/2018 0112   HGBUR negative  12/13/2008 [place] 10/04/2018 0112   BILIRUBINUR neg 04/10/2018 1519   KETONESUR NEGATIVE 10/04/2018 0112   PROTEINUR NEGATIVE 10/04/2018 0112   UROBILINOGEN 0.2 04/10/2018 1519   UROBILINOGEN 0.2 09/18/2011 2049   NITRITE NEGATIVE 10/04/2018 0112   LEUKOCYTESUR NEGATIVE 10/04/2018 0112   Sepsis Labs: Invalid input(s): PROCALCITONIN, LACTICIDVEN  Recent Results (from the past 240 hour(s))  Culture, blood (Routine x 2)     Status: Abnormal (Preliminary result)   Collection Time: 10/03/18  9:30 PM   Specimen: BLOOD RIGHT HAND  Result Value Ref Range Status   Specimen Description BLOOD RIGHT HAND  Final   Special Requests   Final    BOTTLES DRAWN AEROBIC AND ANAEROBIC Blood Culture results may not be optimal due to an excessive volume of blood received in culture bottles   Culture  Setup Time   Final    GRAM POSITIVE COCCI IN CHAINS IN BOTH AEROBIC AND ANAEROBIC BOTTLES CRITICAL VALUE NOTED.  VALUE IS CONSISTENT WITH PREVIOUSLY REPORTED AND CALLED VALUE. Performed at [facility], [place] [address], [place], [place] [address]    Culture STREPTOCOCCUS SALIVARIUS (A)  Final   Report Status PENDING  Incomplete  Culture, blood (Routine x 2)     Status: Abnormal (Preliminary result)   Collection Time: 10/03/18  9:40 PM   Specimen: BLOOD LEFT HAND  Result Value Ref Range Status   Specimen Description BLOOD LEFT HAND  Final   Special Requests   Final    BOTTLES DRAWN AEROBIC ONLY Blood Culture results may not be optimal due to an excessive volume of blood received in culture bottles   Culture  Setup Time   Final    GRAM POSITIVE COCCI IN CHAINS AEROBIC BOTTLE ONLY CRITICAL RESULT CALLED TO, READ BACK BY AND VERIFIED WITH: RHRMD L SEAY @2211  10/04/18 BY S GEZAHEGN     Culture (A)  Final    STREPTOCOCCUS SALIVARIUS SUSCEPTIBILITIES TO FOLLOW Performed at [facility], [place] [address], [place],  [address]    Report Status PENDING  Incomplete  Blood Culture ID Panel (Reflexed)     Status: Abnormal   Collection Time: 10/03/18  9:40 PM  Result Value Ref Range Status   Enterococcus species NOT DETECTED NOT DETECTED Final   Listeria monocytogenes NOT DETECTED NOT DETECTED Final   Staphylococcus species NOT DETECTED NOT DETECTED Final   Staphylococcus aureus (BCID) NOT DETECTED NOT DETECTED Final   Streptococcus species DETECTED (A) NOT DETECTED Final    Comment: Not Enterococcus species, Streptococcus agalactiae, Streptococcus pyogenes, or Streptococcus pneumoniae. CRITICAL RESULT CALLED TO, READ BACK BY AND VERIFIED WITH: PHRMD L SEAY @2211  10/04/18 BY S GEZAHEGN    Streptococcus agalactiae NOT DETECTED NOT DETECTED Final   Streptococcus pneumoniae NOT DETECTED NOT DETECTED Final   Streptococcus pyogenes NOT DETECTED NOT DETECTED Final   Acinetobacter baumannii NOT DETECTED NOT DETECTED Final   Enterobacteriaceae species NOT DETECTED NOT DETECTED Final   Enterobacter cloacae complex NOT DETECTED NOT DETECTED Final   Escherichia coli NOT DETECTED NOT DETECTED Final   Klebsiella oxytoca NOT DETECTED NOT DETECTED Final   Klebsiella pneumoniae NOT DETECTED NOT DETECTED Final   Proteus species NOT DETECTED NOT DETECTED Final   Serratia marcescens NOT DETECTED NOT DETECTED Final   Haemophilus influenzae NOT DETECTED NOT DETECTED Final   Neisseria meningitidis NOT DETECTED NOT DETECTED Final   Pseudomonas aeruginosa NOT DETECTED NOT DETECTED Final   Candida albicans NOT DETECTED NOT DETECTED Final  Candida glabrata NOT DETECTED NOT DETECTED Final   Candida krusei NOT DETECTED NOT DETECTED Final   Candida parapsilosis NOT DETECTED NOT DETECTED Final   Candida tropicalis NOT DETECTED NOT DETECTED Final    Comment: Performed at [facility], [place] [address] [place], [place] [address]  SARS Coronavirus 2     Status: None   Collection Time: 10/03/18 10:28 PM  Result Value Ref Range Status   SARS Coronavirus 2 NOT DETECTED NOT DETECTED Final    Comment: (NOTE) SARS-CoV-2 target nucleic acids are NOT DETECTED. The SARS-CoV-2 RNA is generally detectable in upper and lower respiratory specimens during the acute phase of infection.  Negative  results do not preclude SARS-CoV-2 infection, do not rule out co-infections with other pathogens, and should not be used as the sole basis for treatment or other patient management decisions.  Negative results must be combined with clinical observations, patient history, and epidemiological information. The expected result is Not Detected. Fact Sheet for Patients: http://www.biofiredefense.com/wp-content/uploads/2020/03/BIOFIRE-COVID -19-patients.pdf Fact Sheet for Healthcare Providers: http://www.biofiredefense.com/wp-content/uploads/2020/03/BIOFIRE-COVID -19-hcp.pdf This test is not yet approved or cleared by the [place] and  has been authorized for detection and/or diagnosis of SARS-CoV-2 by FDA under an Emergency Use Authorization (EUA).  This EUA will remain in effec t (meaning this test can be used) for the duration of  the COVID-19 declaration under Section 564(b)(1) of the Act, 21 U.S.C. section 360bbb-3(b)(1), unless the authorization is terminated or revoked sooner. Performed at [facility], [place] [address] [place], [place] [address]       Radiology Studies: No results found.   Eloisa Chokshi T. [facility] Pager [phone]  If 7PM-7AM, please contact night-coverage [email or web address] Password [facility] 10/06/2018, 11:07 AM

## 2018-10-06 NOTE — Progress Notes (Signed)
Orders released for AM procedure, including obtaining consent. Pt unable to tell this RN the name or type of procedure, rationale for procedure, or MD who will be performing. Obtaining consent deferred.

## 2018-10-06 NOTE — Progress Notes (Signed)
Attempted to call the husband, Breanna Webster, today 3 times with no answer.

## 2018-10-06 NOTE — H&P (View-Only) (Signed)
The patient has been seen in conjunction with Reino Bellis, NP. All aspects of care have been considered and discussed. The patient has been personally interviewed, examined, and all clinical data has been reviewed.   Has bacteremia and appropriately will undergo TEE tomorrow.  Gram positive bacteremia--> Strep.  Progress Note  Patient Name: Breanna Webster Date of Encounter: 10/06/2018  Primary Cardiologist: [name] III, MD   Subjective   No complaints other than tired this morning.   Inpatient Medications    Scheduled Meds: . aspirin  81 mg Oral Daily  . bisoprolol  5 mg Oral Daily  . clopidogrel  75 mg Oral Q breakfast  . enoxaparin (LOVENOX) injection  40 mg Subcutaneous Q24H  . escitalopram  20 mg Oral Daily  . guaiFENesin  600 mg Oral BID  . insulin aspart  0-15 Units Subcutaneous TID WC  . insulin aspart  0-5 Units Subcutaneous QHS  . insulin aspart  6 Units Subcutaneous TID WC  . insulin glargine  40 Units Subcutaneous Daily  . levothyroxine  175 mcg Oral Q0600  . multivitamin with minerals  1 tablet Oral Daily  . nortriptyline  25 mg Oral QHS  . pantoprazole  40 mg Oral BID  . sodium chloride flush  3 mL Intravenous Once   Continuous Infusions: . cefTRIAXone (ROCEPHIN)  IV 2 g (10/05/18 2151)   PRN Meds: acetaminophen, ipratropium-albuterol   Vital Signs    Vitals:   10/05/18 0329 10/05/18 0847 10/05/18 2000 10/06/18 0500  BP: (!) 147/71 137/65 (!) 122/49 128/61  Pulse: 69 72 67 63  Resp: 20  18   Temp: 98.4 F (36.9 C) 98.9 F (37.2 C) 98.2 F (36.8 C) 97.9 F (36.6 C)  TempSrc: Oral Oral Oral Oral  SpO2: 98% 95% 95% 99%  Weight: 92.4 kg     Height:        Intake/Output Summary (Last 24 hours) at 10/06/2018 0903 Last data filed at 10/06/2018 0300 Gross per 24 hour  Intake 100 ml  Output -  Net 100 ml   Last 3 Weights 10/05/2018 10/04/2018 10/03/2018  Weight (lbs) 203 lb 11.3 oz 204 lb 6.4 oz 202 lb  Weight (kg) 92.4 kg 92.715 kg  91.627 kg      Telemetry    SR - Personally Reviewed  ECG    N/a - Personally Reviewed  Physical Exam   GEN: No acute distress.   Neck: No JVD Cardiac: RRR, + systolic murmur, no rubs, or gallops.  Respiratory: Clear to auscultation bilaterally. GI: Soft, nontender, non-distended  MS: No edema; No deformity. Neuro:  Nonfocal  Psych: Normal affect   Labs    Chemistry Recent Labs  Lab 10/03/18 2136 10/04/18 0750 10/05/18 0433 10/06/18 0631  NA 132* 135 138 138  K 3.9 3.6 3.3* 3.5  CL 99 105 108 107  CO2 19* 20* 21* 23  GLUCOSE 143* 147* 110* 115*  BUN 25* 21 11 6*  CREATININE 1.02* 0.91 0.74 0.69  CALCIUM 9.6 8.4* 8.9 9.4  PROT 7.3 6.1* 6.0*  --   ALBUMIN 3.1* 2.7* 2.6*  --   AST 46* 38 34  --   ALT 41 30 26  --   ALKPHOS 85 78 66  --   BILITOT 1.9* 1.5* 0.9  --   GFRNONAA 54* >60 >60 >60  GFRAA >60 >60 >60 >60  ANIONGAP 14 10 9 8      Hematology Recent Labs  Lab 10/04/18 0750 10/05/18  1914 10/05/18 0710 10/06/18 0631  WBC 12.9* 8.4  --  7.3  RBC 4.96 4.57 4.99 4.70  HGB 9.3* 8.6*  --  9.0*  HCT 31.0* 28.4*  --  29.2*  MCV 62.5* 62.1*  --  62.1*  MCH 18.8* 18.8*  --  19.1*  MCHC 30.0 30.3  --  30.8  RDW 15.8* 15.9*  --  15.9*  PLT 167 146*  --  175    Cardiac Enzymes Recent Labs  Lab 10/03/18 2136 10/04/18 0100 10/04/18 0440  TROPONINI 0.98* 0.91* 0.83*   No results for input(s): TROPIPOC in the last 168 hours.   BNPNo results for input(s): BNP, PROBNP in the last 168 hours.   DDimer No results for input(s): DDIMER in the last 168 hours.   Radiology    No results found.  Cardiac Studies   TTE: pending  Patient Profile     73 y.o. female with a history of CAD, s/p PCI in 2019, AS s/p TAVR.  Now admitted with sepsis/ pneumonia and positive troponin.   Assessment & Plan    1. Elevated troponin: peaked at 0.98. No chest pain. Suspect demand ischemia in the setting of sepsis 2/2 CAP.  -- echo pending read  2. CAD: s/p recent  stenting of the RCA back in December. On ASA/plavix.   3. Sepsis 2/2 to CAP: antibiotics per primary.  4. AS s/ TAVR: 3/20, states she did not have much in regards to symptom improvement after TAVR. With bacteremia, ID consulted with recommendation for TEE. TTE read is pending.   5. Acute Encephalopathy: significant confusion on admission, but now resolved. CT head was negative.     For questions or updates, please contact [place] Please consult [email or web address] for contact info under        Signed, Reino Bellis, NP  10/06/2018, 9:03 AM

## 2018-10-06 NOTE — Telephone Encounter (Signed)
Reached out to patient to set up an in lab sleep study and per dpr spoke to he husband who informed me the patient was admitted in the hospital on last Thursday 10/02/18 for pneumonia and she has not been discharged. Her husband says she does not have COVID-19. Her husband wants her to get well before talking about another sleep study. He asked [name] to call them back in a week or two.

## 2018-10-06 NOTE — Progress Notes (Addendum)
The patient has been seen in conjunction with Reino Bellis, NP. All aspects of care have been considered and discussed. The patient has been personally interviewed, examined, and all clinical data has been reviewed.   Has bacteremia and appropriately will undergo TEE tomorrow.  Gram positive bacteremia--> Strep.  Progress Note  Patient Name: Breanna Webster Date of Encounter: 10/06/2018  Primary Cardiologist: [name] III, MD   Subjective   No complaints other than tired this morning.   Inpatient Medications    Scheduled Meds: . aspirin  81 mg Oral Daily  . bisoprolol  5 mg Oral Daily  . clopidogrel  75 mg Oral Q breakfast  . enoxaparin (LOVENOX) injection  40 mg Subcutaneous Q24H  . escitalopram  20 mg Oral Daily  . guaiFENesin  600 mg Oral BID  . insulin aspart  0-15 Units Subcutaneous TID WC  . insulin aspart  0-5 Units Subcutaneous QHS  . insulin aspart  6 Units Subcutaneous TID WC  . insulin glargine  40 Units Subcutaneous Daily  . levothyroxine  175 mcg Oral Q0600  . multivitamin with minerals  1 tablet Oral Daily  . nortriptyline  25 mg Oral QHS  . pantoprazole  40 mg Oral BID  . sodium chloride flush  3 mL Intravenous Once   Continuous Infusions: . cefTRIAXone (ROCEPHIN)  IV 2 g (10/05/18 2151)   PRN Meds: acetaminophen, ipratropium-albuterol   Vital Signs    Vitals:   10/05/18 0329 10/05/18 0847 10/05/18 2000 10/06/18 0500  BP: (!) 147/71 137/65 (!) 122/49 128/61  Pulse: 69 72 67 63  Resp: 20  18   Temp: 98.4 F (36.9 C) 98.9 F (37.2 C) 98.2 F (36.8 C) 97.9 F (36.6 C)  TempSrc: Oral Oral Oral Oral  SpO2: 98% 95% 95% 99%  Weight: 92.4 kg     Height:        Intake/Output Summary (Last 24 hours) at 10/06/2018 0903 Last data filed at 10/06/2018 0300 Gross per 24 hour  Intake 100 ml  Output -  Net 100 ml   Last 3 Weights 10/05/2018 10/04/2018 10/03/2018  Weight (lbs) 203 lb 11.3 oz 204 lb 6.4 oz 202 lb  Weight (kg) 92.4 kg 92.715 kg  91.627 kg      Telemetry    SR - Personally Reviewed  ECG    N/a - Personally Reviewed  Physical Exam   GEN: No acute distress.   Neck: No JVD Cardiac: RRR, + systolic murmur, no rubs, or gallops.  Respiratory: Clear to auscultation bilaterally. GI: Soft, nontender, non-distended  MS: No edema; No deformity. Neuro:  Nonfocal  Psych: Normal affect   Labs    Chemistry Recent Labs  Lab 10/03/18 2136 10/04/18 0750 10/05/18 0433 10/06/18 0631  NA 132* 135 138 138  K 3.9 3.6 3.3* 3.5  CL 99 105 108 107  CO2 19* 20* 21* 23  GLUCOSE 143* 147* 110* 115*  BUN 25* 21 11 6*  CREATININE 1.02* 0.91 0.74 0.69  CALCIUM 9.6 8.4* 8.9 9.4  PROT 7.3 6.1* 6.0*  --   ALBUMIN 3.1* 2.7* 2.6*  --   AST 46* 38 34  --   ALT 41 30 26  --   ALKPHOS 85 78 66  --   BILITOT 1.9* 1.5* 0.9  --   GFRNONAA 54* >60 >60 >60  GFRAA >60 >60 >60 >60  ANIONGAP 14 10 9 8      Hematology Recent Labs  Lab 10/04/18 0750 10/05/18  2957 10/05/18 0710 10/06/18 0631  WBC 12.9* 8.4  --  7.3  RBC 4.96 4.57 4.99 4.70  HGB 9.3* 8.6*  --  9.0*  HCT 31.0* 28.4*  --  29.2*  MCV 62.5* 62.1*  --  62.1*  MCH 18.8* 18.8*  --  19.1*  MCHC 30.0 30.3  --  30.8  RDW 15.8* 15.9*  --  15.9*  PLT 167 146*  --  175    Cardiac Enzymes Recent Labs  Lab 10/03/18 2136 10/04/18 0100 10/04/18 0440  TROPONINI 0.98* 0.91* 0.83*   No results for input(s): TROPIPOC in the last 168 hours.   BNPNo results for input(s): BNP, PROBNP in the last 168 hours.   DDimer No results for input(s): DDIMER in the last 168 hours.   Radiology    No results found.  Cardiac Studies   TTE: pending  Patient Profile     73 y.o. female with a history of CAD, s/p PCI in 2019, AS s/p TAVR.  Now admitted with sepsis/ pneumonia and positive troponin.   Assessment & Plan    1. Elevated troponin: peaked at 0.98. No chest pain. Suspect demand ischemia in the setting of sepsis 2/2 CAP.  -- echo pending read  2. CAD: s/p recent  stenting of the RCA back in December. On ASA/plavix.   3. Sepsis 2/2 to CAP: antibiotics per primary.  4. AS s/ TAVR: 3/20, states she did not have much in regards to symptom improvement after TAVR. With bacteremia, ID consulted with recommendation for TEE. TTE read is pending.   5. Acute Encephalopathy: significant confusion on admission, but now resolved. CT head was negative.     For questions or updates, please contact [facility] Please consult [email or web address] for contact info under        Signed, Reino Bellis, NP  10/06/2018, 9:03 AM

## 2018-10-06 NOTE — Progress Notes (Signed)
[facility]   Reason for visit: Follow up on bacteremia  Interval History: repeat cultures sent today, cardiology consulted and to do a TEE.  Patient asking about going home today.  No associated n/v/d.  No fever, no chills.    Physical Exam: Constitutional:  Vitals:   10/05/18 2000 10/06/18 0500  BP: (!) 122/49 128/61  Pulse: 67 63  Resp: 18   Temp: 98.2 F (36.8 C) 97.9 F (36.6 C)  SpO2: 95% 99%   patient appears in NAD Eyes: anicteric HENT: no thrush Respiratory: Normal respiratory effort; CTA B Cardiovascular: RRR GI: soft, nt, nd  Review of Systems: Constitutional: negative for fevers and chills Gastrointestinal: negative for nausea and diarrhea Integument/breast: negative for rash  Lab Results  Component Value Date   WBC 7.3 10/06/2018   HGB 9.0 (L) 10/06/2018   HCT 29.2 (L) 10/06/2018   MCV 62.1 (L) 10/06/2018   PLT 175 10/06/2018    Lab Results  Component Value Date   CREATININE 0.69 10/06/2018   BUN 6 (L) 10/06/2018   NA 138 10/06/2018   K 3.5 10/06/2018   CL 107 10/06/2018   CO2 23 10/06/2018    Lab Results  Component Value Date   ALT 26 10/05/2018   AST 34 10/05/2018   ALKPHOS 66 10/05/2018     Microbiology: Recent Results (from the past 240 hour(s))  Culture, blood (Routine x 2)     Status: Abnormal (Preliminary result)   Collection Time: 10/03/18  9:30 PM   Specimen: BLOOD RIGHT HAND  Result Value Ref Range Status   Specimen Description BLOOD RIGHT HAND  Final   Special Requests   Final    BOTTLES DRAWN AEROBIC AND ANAEROBIC Blood Culture results may not be optimal due to an excessive volume of blood received in culture bottles   Culture  Setup Time   Final    GRAM POSITIVE COCCI IN CHAINS IN BOTH AEROBIC AND ANAEROBIC BOTTLES CRITICAL VALUE NOTED.  VALUE IS CONSISTENT WITH PREVIOUSLY REPORTED AND CALLED VALUE. Performed at [facility], [place] [address], [place], [place] [address]    Culture  STREPTOCOCCUS SALIVARIUS (A)  Final   Report Status PENDING  Incomplete  Culture, blood (Routine x 2)     Status: Abnormal (Preliminary result)   Collection Time: 10/03/18  9:40 PM   Specimen: BLOOD LEFT HAND  Result Value Ref Range Status   Specimen Description BLOOD LEFT HAND  Final   Special Requests   Final    BOTTLES DRAWN AEROBIC ONLY Blood Culture results may not be optimal due to an excessive volume of blood received in culture bottles   Culture  Setup Time   Final    GRAM POSITIVE COCCI IN CHAINS AEROBIC BOTTLE ONLY CRITICAL RESULT CALLED TO, READ BACK BY AND VERIFIED WITH: RHRMD L SEAY @2211  10/04/18 BY S GEZAHEGN    Culture (A)  Final    STREPTOCOCCUS SALIVARIUS SUSCEPTIBILITIES TO FOLLOW Performed at [facility], [place] [address], [place], [place] [address]    Report Status PENDING  Incomplete  Blood Culture ID Panel (Reflexed)     Status: Abnormal   Collection Time: 10/03/18  9:40 PM  Result Value Ref Range Status   Enterococcus species NOT DETECTED NOT DETECTED Final   Listeria monocytogenes NOT DETECTED NOT DETECTED Final   Staphylococcus species NOT DETECTED NOT DETECTED Final   Staphylococcus aureus (BCID) NOT DETECTED NOT DETECTED Final   Streptococcus species DETECTED (  A) NOT DETECTED Final    Comment: Not Enterococcus species, Streptococcus agalactiae, Streptococcus pyogenes, or Streptococcus pneumoniae. CRITICAL RESULT CALLED TO, READ BACK BY AND VERIFIED WITH: PHRMD L SEAY @2211  10/04/18 BY S GEZAHEGN    Streptococcus agalactiae NOT DETECTED NOT DETECTED Final   Streptococcus pneumoniae NOT DETECTED NOT DETECTED Final   Streptococcus pyogenes NOT DETECTED NOT DETECTED Final   Acinetobacter baumannii NOT DETECTED NOT DETECTED Final   Enterobacteriaceae species NOT DETECTED NOT DETECTED Final   Enterobacter cloacae complex NOT DETECTED NOT DETECTED Final   Escherichia coli NOT DETECTED NOT DETECTED Final   Klebsiella oxytoca NOT DETECTED NOT DETECTED  Final   Klebsiella pneumoniae NOT DETECTED NOT DETECTED Final   Proteus species NOT DETECTED NOT DETECTED Final   Serratia marcescens NOT DETECTED NOT DETECTED Final   Haemophilus influenzae NOT DETECTED NOT DETECTED Final   Neisseria meningitidis NOT DETECTED NOT DETECTED Final   Pseudomonas aeruginosa NOT DETECTED NOT DETECTED Final   Candida albicans NOT DETECTED NOT DETECTED Final   Candida glabrata NOT DETECTED NOT DETECTED Final   Candida krusei NOT DETECTED NOT DETECTED Final   Candida parapsilosis NOT DETECTED NOT DETECTED Final   Candida tropicalis NOT DETECTED NOT DETECTED Final    Comment: Performed at [facility], [place] [address] [place], [place] [address]  SARS Coronavirus 2     Status: None   Collection Time: 10/03/18 10:28 PM  Result Value Ref Range Status   SARS Coronavirus 2 NOT DETECTED NOT DETECTED Final    Comment: (NOTE) SARS-CoV-2 target nucleic acids are NOT DETECTED. The SARS-CoV-2 RNA is generally detectable in upper and lower respiratory specimens during the acute phase of infection.  Negative  results do not preclude SARS-CoV-2 infection, do not rule out co-infections with other pathogens, and should not be used as the sole basis for treatment or other patient management decisions.  Negative results must be combined with clinical observations, patient history, and epidemiological information. The expected result is Not Detected. Fact Sheet for Patients: http://www.biofiredefense.com/wp-content/uploads/2020/03/BIOFIRE-COVID -19-patients.pdf Fact Sheet for Healthcare Providers: http://www.biofiredefense.com/wp-content/uploads/2020/03/BIOFIRE-COVID -19-hcp.pdf This test is not yet approved or cleared by the [place] and  has been authorized for detection and/or diagnosis of SARS-CoV-2 by FDA under an Emergency Use Authorization (EUA).  This EUA will remain in effec t (meaning this test can be used) for the duration of  the COVID-19  declaration under Section 564(b)(1) of the Act, 21 U.S.C. section 360bbb-3(b)(1), unless the authorization is terminated or revoked sooner. Performed at [facility], [place] [address] [place], [place] [address]     Impression/Plan:  1. Bacteremia - repeat cultures sent and on ceftriaxone.  Will narrow based on sensitivities.    2.  TAVR - to get a TEE,  TTE result pending.   3.  Access - will need picc line if blood cultures remain negative at least 48 hours.    Dr. Baxter Flattery on tomorrow

## 2018-10-07 ENCOUNTER — Inpatient Hospital Stay (HOSPITAL_COMMUNITY): Payer: Medicare Other

## 2018-10-07 ENCOUNTER — Encounter (HOSPITAL_COMMUNITY)
Admission: EM | Disposition: A | Discharge status: Home Health | Payer: Self-pay | Source: Home / Self Care | Attending: Student

## 2018-10-07 ENCOUNTER — Encounter (HOSPITAL_COMMUNITY): Payer: Self-pay | Admitting: *Deleted

## 2018-10-07 ENCOUNTER — Telehealth (HOSPITAL_COMMUNITY): Payer: Self-pay

## 2018-10-07 DIAGNOSIS — I361 Nonrheumatic tricuspid (valve) insufficiency: Secondary | ICD-10-CM

## 2018-10-07 DIAGNOSIS — A409 Streptococcal sepsis, unspecified: Secondary | ICD-10-CM

## 2018-10-07 DIAGNOSIS — I330 Acute and subacute infective endocarditis: Secondary | ICD-10-CM

## 2018-10-07 DIAGNOSIS — I059 Rheumatic mitral valve disease, unspecified: Secondary | ICD-10-CM

## 2018-10-07 DIAGNOSIS — I340 Nonrheumatic mitral (valve) insufficiency: Secondary | ICD-10-CM

## 2018-10-07 DIAGNOSIS — R011 Cardiac murmur, unspecified: Secondary | ICD-10-CM

## 2018-10-07 DIAGNOSIS — B955 Unspecified streptococcus as the cause of diseases classified elsewhere: Secondary | ICD-10-CM

## 2018-10-07 DIAGNOSIS — I058 Other rheumatic mitral valve diseases: Secondary | ICD-10-CM

## 2018-10-07 DIAGNOSIS — B954 Other streptococcus as the cause of diseases classified elsewhere: Secondary | ICD-10-CM

## 2018-10-07 HISTORY — DX: Unspecified streptococcus as the cause of diseases classified elsewhere: B95.5

## 2018-10-07 HISTORY — PX: TEE WITHOUT CARDIOVERSION: SHX5443

## 2018-10-07 LAB — CBC
HCT: 28.6 % — ABNORMAL LOW (ref 36.0–46.0)
Hemoglobin: 8.8 g/dL — ABNORMAL LOW (ref 12.0–15.0)
MCH: 18.9 pg — ABNORMAL LOW (ref 26.0–34.0)
MCHC: 30.8 g/dL (ref 30.0–36.0)
MCV: 61.4 fL — ABNORMAL LOW (ref 80.0–100.0)
Platelets: 177 10*3/uL (ref 150–400)
RBC: 4.66 MIL/uL (ref 3.87–5.11)
RDW: 15.9 % — ABNORMAL HIGH (ref 11.5–15.5)
WBC: 6.8 10*3/uL (ref 4.0–10.5)
nRBC: 0.3 % — ABNORMAL HIGH (ref 0.0–0.2)

## 2018-10-07 LAB — CULTURE, BLOOD (ROUTINE X 2)

## 2018-10-07 LAB — ECHOCARDIOGRAM COMPLETE
Height: 65 in
Weight: 3259.28 oz

## 2018-10-07 LAB — PROCALCITONIN: Procalcitonin: 1 ng/mL

## 2018-10-07 LAB — GLUCOSE, CAPILLARY
Glucose-Capillary: 114 mg/dL — ABNORMAL HIGH (ref 70–99)
Glucose-Capillary: 120 mg/dL — ABNORMAL HIGH (ref 70–99)
Glucose-Capillary: 213 mg/dL — ABNORMAL HIGH (ref 70–99)
Glucose-Capillary: 157 mg/dL — ABNORMAL HIGH (ref 70–99)

## 2018-10-07 LAB — MAGNESIUM: Magnesium: 1.7 mg/dL (ref 1.7–2.4)

## 2018-10-07 SURGERY — ECHOCARDIOGRAM, TRANSESOPHAGEAL
Anesthesia: Moderate Sedation

## 2018-10-07 MED ORDER — BUTAMBEN-TETRACAINE-BENZOCAINE 2-2-14 % EX AERO
INHALATION_SPRAY | CUTANEOUS | Status: DC | PRN
  Administered 2018-10-07: 2 via TOPICAL

## 2018-10-07 MED ORDER — MIDAZOLAM HCL (PF) 5 MG/ML IJ SOLN
INTRAMUSCULAR | Status: AC
  Filled 2018-10-07: qty 2

## 2018-10-07 MED ORDER — DIPHENHYDRAMINE HCL 50 MG/ML IJ SOLN
INTRAMUSCULAR | Status: AC
  Filled 2018-10-07: qty 1

## 2018-10-07 MED ORDER — FENTANYL CITRATE (PF) 100 MCG/2ML IJ SOLN
INTRAMUSCULAR | Status: DC | PRN
  Administered 2018-10-07 (×2): 25 ug via INTRAVENOUS

## 2018-10-07 MED ORDER — GENTAMICIN IN SALINE 1.6-0.9 MG/ML-% IV SOLN
80.0000 mg | Freq: Two times a day (BID) | INTRAVENOUS | Status: DC
  Administered 2018-10-07 – 2018-10-09 (×4): 80 mg via INTRAVENOUS
  Filled 2018-10-07 (×4): qty 50

## 2018-10-07 MED ORDER — MIDAZOLAM HCL (PF) 10 MG/2ML IJ SOLN
INTRAMUSCULAR | Status: DC | PRN
  Administered 2018-10-07 (×2): 2 mg via INTRAVENOUS

## 2018-10-07 MED ORDER — FENTANYL CITRATE (PF) 100 MCG/2ML IJ SOLN
INTRAMUSCULAR | Status: AC
  Filled 2018-10-07: qty 2

## 2018-10-07 NOTE — CV Procedure (Signed)
   Transesophageal Echocardiogram  Indications: Bacteremia  Time out performed  During this procedure the patient is administered a total of Versed 4 mg and Fentanyl 50 mcg to achieve and maintain moderate conscious sedation.  The patient's heart rate, blood pressure, and oxygen saturation are monitored continuously during the procedure. The period of conscious sedation is 20 minutes, of which I was present face-to-face 100% of this time.  Findings:  Left Ventricle: Normal EF 55%   Mitral Valve: Anterior leaflet vegetation, atrial surface, Base is 1.8cm, length is 1.8 cm (tapers off to thin mobile vegetation). There is mild to moderate mitral regurgitation present. No pulmonary vein flow reversal.   Aortic Valve: TAVR valve in place, no aortic regurgitation or leak  Tricuspid Valve: Thickened, mild TR  Left Atrium: No LAA thrombus, no shunt.   IMPRESSION:   - NATIVE MITRAL VALVE ENDOCARDITIS.   - Discussed with Dr. Tamala Julian and husband.    Candee Furbish, MD

## 2018-10-07 NOTE — Progress Notes (Signed)
PROGRESS NOTE  Breanna Webster:[id number] DOB: 1946-02-20 DOA: 10/03/2018 PCP: [name], NP   LOS: 3 days   Patient is from: Home  Brief Narrative / Interim history: 73 year old female with history of AS s/p TAVR in 3/20, CAD s/p stent to RCA in 03/2018, HTN, PHTN, DM-2, hypothyroidism, LBBB and GERD admitted with acute shortness of breath, fever and altered mental status.  Reportedly oriented to person and place only on admission. In the ED, hemodynamically stable except for RR to 31.  Oxygen saturation 93% on 2 L.  Leukocytosis to 16,000.  Lactate of 4.1.  Elevated to 0.98.  CXR, CT head, CTA chest/abdomen/pelvis without acute significant finding.  COVID-19 negative.  Urinalysis negative.  Received 4 L of IV fluid bolus and lactate trended down to 2.4.  Blood cultures drawn.  Started on broad-spectrum antibiotics including acyclovir.  Cardiology consulted.  Patient was admitted for undifferentiated sepsis. Blood culture grew GPC/Streptococcus species in 3 out of 4.  Antibiotic de-escalated to ceftriaxone .  Doxycycline discontinued.  TEE 6/16   Subjective: Seen this morning, resting well denies nausea vomiting chest pain shortness of breath fever or back pain.  She is waiting for TEE.  Assessment & Plan: STREPTOCOCCUS SALIVARIUS Sepsis with infective endocarditis. Blood culture on 6/12 Streptococcus salivarius Underwent TEE6/16 that shows IE but no obvious TAVR valve infection.  Continue ceftriaxone,ID on board . CXR and CTA chest without acute finding. Repeat blood culture 6/15 in process.  Leukocytosis/lactic acidosis resolved and currently afebrile. Recent Labs  Lab 10/03/18 2136 10/04/18 0750 10/05/18 0433 10/06/18 0631 10/07/18 0412  WBC 16.2* 12.9* 8.4 7.3 6.8   Acute metabolic encephalopathy on admission: Likely due to #1.  Currently resolved.  CT head unremarkable and nonfocal on neuro exams.  Denies any neck stiffness or back pain.    Shortness of  breath/dyspnea/dry cough: CAP? But chest x-ray CTA chest unremarkable.  Continue PRN nebs mucolytics incentive spirometry.  Elevated troponin in the setting of /history of CAD s/p RCA stent in 03/2018: Currently no chest pain.  Likely demand ischemia from sepsis.  Followed by cardiology off heparin, continue Plavix, aspirin, bisoprolol.  Patient is intolerant to statin.    Hypertension: BP controlled, home losartan and HCTZ on hold for now  Poorly controlled IDDM-2 with hyperglycemia: A1c 8.5%.  On  NPH 120 units daily: Blood sugar controlled, continue Lantus 40 units daily, novolog 6 units with meals, and sliding scale insulin and monitor Accu-Chek..   Anemia of chronic disease: Hemoglobin 8.6.  MCV 63.  Anemia panel consistent with anemia of chronic disease.  Not sure if she may have underlying hemoglobinopathy.  Suspect worsening due to infective endocarditis. Recent Labs  Lab 10/03/18 2136 10/04/18 0750 10/05/18 0433 10/06/18 0631 10/07/18 0412  HGB 10.6* 9.3* 8.6* 9.0* 8.8*  HCT 33.9* 31.0* 28.4* 29.2* 28.6*    Hyponatremia: Likely due to IVF.  Resolved.  Hypothyroidism: Euthyroid, continue Synthroid   Depression: Clinically stable on Lexapro and nortriptyline.  GERD: on PPI  Scheduled Meds:  aspirin  81 mg Oral Daily   bisoprolol  5 mg Oral Daily   clopidogrel  75 mg Oral Q breakfast   enoxaparin (LOVENOX) injection  40 mg Subcutaneous Q24H   escitalopram  20 mg Oral Daily   guaiFENesin  600 mg Oral BID   insulin aspart  0-15 Units Subcutaneous TID WC   insulin aspart  0-5 Units Subcutaneous QHS   insulin aspart  6 Units Subcutaneous TID WC   insulin glargine  40 Units Subcutaneous Daily   levothyroxine  175 mcg Oral Q0600   multivitamin with minerals  1 tablet Oral Daily   nortriptyline  25 mg Oral QHS   pantoprazole  40 mg Oral BID   sodium chloride flush  3 mL Intravenous Once   Continuous Infusions:  cefTRIAXone (ROCEPHIN)  IV 2 g (10/06/18  2217)   PRN Meds:.acetaminophen, ipratropium-albuterol   DVT prophylaxis: Lovonex Code Status: Full code Family Communication: Updated patient's husband over the phone by previous attending.  Discussed plan of care with the patient patient reports she can handle IV antibiotics at home Disposition Plan: Remains inpatient pending clinical improvement.  Remains on IV antibiotics. cont PT eval.  Consultants:   Cardiology  Infectious disease  Procedures:   TEE 6/16 REPORT PENDING  Microbiology:  OPFYT-24 negative  Blood culture-Streptococcus species  Antimicrobials:  -Doxycycline 5/13-5/14.  -Ceftriaxone 5/14 .> -Acyclovir discontinued on 6/13.  Anti-infectives (From admission, onward)   Start     Dose/Rate Route Frequency Ordered Stop   10/05/18 1000  doxycycline (VIBRA-TABS) tablet 100 mg  Status:  Discontinued     100 mg Oral Every 12 hours 10/05/18 0813 10/05/18 1300   10/04/18 2230  cefTRIAXone (ROCEPHIN) 2 g in sodium chloride 0.9 % 100 mL IVPB     2 g 200 mL/hr over 30 Minutes Intravenous Every 24 hours 10/04/18 2214     10/04/18 1415  doxycycline (VIBRA-TABS) tablet 100 mg  Status:  Discontinued     100 mg Oral Every 12 hours 10/04/18 1337 10/04/18 2216   10/04/18 1200  vancomycin (VANCOCIN) IVPB 1000 mg/200 mL premix  Status:  Discontinued     1,000 mg 200 mL/hr over 60 Minutes Intravenous Every 12 hours 10/03/18 2341 10/04/18 2215   10/04/18 1000  ceFEPIme (MAXIPIME) 2 g in sodium chloride 0.9 % 100 mL IVPB  Status:  Discontinued     2 g 200 mL/hr over 30 Minutes Intravenous Every 12 hours 10/03/18 2341 10/04/18 0320   10/04/18 0600  piperacillin-tazobactam (ZOSYN) IVPB 3.375 g  Status:  Discontinued     3.375 g 12.5 mL/hr over 240 Minutes Intravenous Every 8 hours 10/04/18 0323 10/04/18 2215   10/04/18 0400  acyclovir (ZOVIRAX) 600 mg in dextrose 5 % 100 mL IVPB  Status:  Discontinued     600 mg 112 mL/hr over 60 Minutes Intravenous Every 8 hours 10/04/18  0323 10/04/18 1337   10/03/18 2245  ceFEPIme (MAXIPIME) 2 g in sodium chloride 0.9 % 100 mL IVPB     2 g 200 mL/hr over 30 Minutes Intravenous  Once 10/03/18 2231 10/04/18 0252   10/03/18 2245  metroNIDAZOLE (FLAGYL) IVPB 500 mg     500 mg 100 mL/hr over 60 Minutes Intravenous  Once 10/03/18 2231 10/04/18 0136   10/03/18 2245  vancomycin (VANCOCIN) IVPB 1000 mg/200 mL premix  Status:  Discontinued     1,000 mg 200 mL/hr over 60 Minutes Intravenous  Once 10/03/18 2231 10/03/18 2235   10/03/18 2245  vancomycin (VANCOCIN) 2,000 mg in sodium chloride 0.9 % 500 mL IVPB     2,000 mg 250 mL/hr over 120 Minutes Intravenous  Once 10/03/18 2235 10/04/18 0354      Objective: Vitals:   10/07/18 1031 10/07/18 1041 10/07/18 1051 10/07/18 1108  BP: (!) 147/48 (!) 149/45 (!) 148/44 (!) 151/68  Pulse: 65 (!) 57 (!) 57   Resp: (!) 21 19 17 18   Temp: 98.4 F (36.9 C)   98.3 F (36.8 C)  TempSrc:    Oral  SpO2: 93% 99% 97% 96%  Weight:      Height:        Intake/Output Summary (Last 24 hours) at 10/07/2018 1141 Last data filed at 10/07/2018 0600 Gross per 24 hour  Intake 277.04 ml  Output --  Net 277.04 ml   Filed Weights   10/03/18 2300 10/04/18 0419 10/05/18 0329  Weight: 91.6 kg 92.7 kg 92.4 kg    Examination: General exam: Calm, comfortable, not in acute distress, older for age, average built.  HEENT:Oral mucosa moist, Ear/Nose WNL grossly, dentition normal. Respiratory system: Bilateral equal air entry, no crackles and wheezing, no use of accessory muscle, non tender on palpation. Cardiovascular system: regular rate and rhythm, S1 & S2 heard, No JVD. Murmurs +. Gastrointestinal system: Abdomen soft, non-tender, non-distended, BS +. No hepatosplenomegaly palpable. Nervous System:Alert, awake and oriented at baseline. Able to move UE and LE, sensation intact. Extremities: No edema, distal peripheral pulses palpable.  Skin: No rashes,no icterus. MSK: Normal muscle bulk,tone,  power  Data Reviewed: I have independently reviewed following labs and imaging studies   CBC: Recent Labs  Lab 10/03/18 2136 10/04/18 0750 10/05/18 0433 10/06/18 0631 10/07/18 0412  WBC 16.2* 12.9* 8.4 7.3 6.8  NEUTROABS 14.6*  --   --   --   --   HGB 10.6* 9.3* 8.6* 9.0* 8.8*  HCT 33.9* 31.0* 28.4* 29.2* 28.6*  MCV 61.4* 62.5* 62.1* 62.1* 61.4*  PLT 208 167 146* 175 193   Basic Metabolic Panel: Recent Labs  Lab 10/03/18 2136 10/04/18 0750 10/05/18 0433 10/06/18 0631 10/07/18 0412  NA 132* 135 138 138  --   K 3.9 3.6 3.3* 3.5  --   CL 99 105 108 107  --   CO2 19* 20* 21* 23  --   GLUCOSE 143* 147* 110* 115*  --   BUN 25* 21 11 6*  --   CREATININE 1.02* 0.91 0.74 0.69  --   CALCIUM 9.6 8.4* 8.9 9.4  --   MG  --   --  2.0 1.9 1.7   GFR: Estimated Creatinine Clearance: 69.3 mL/min (by C-G formula based on SCr of 0.69 mg/dL). Liver Function Tests: Recent Labs  Lab 10/03/18 2136 10/04/18 0750 10/05/18 0433  AST 46* 38 34  ALT 41 30 26  ALKPHOS 85 78 66  BILITOT 1.9* 1.5* 0.9  PROT 7.3 6.1* 6.0*  ALBUMIN 3.1* 2.7* 2.6*   No results for input(s): LIPASE, AMYLASE in the last 168 hours. No results for input(s): AMMONIA in the last 168 hours. Coagulation Profile: Recent Labs  Lab 10/03/18 2136 10/04/18 0750  INR 1.3* 1.5*   Cardiac Enzymes: Recent Labs  Lab 10/03/18 2136 10/04/18 0100 10/04/18 0440  TROPONINI 0.98* 0.91* 0.83*   BNP (last 3 results) Recent Labs    02/19/18 1233  PROBNP 270   HbA1C: Recent Labs    10/05/18 0433  HGBA1C 8.5*   CBG: Recent Labs  Lab 10/06/18 1207 10/06/18 1634 10/06/18 2030 10/07/18 0626 10/07/18 1131  GLUCAP 185* 144* 118* 114* 120*   Lipid Profile: No results for input(s): CHOL, HDL, LDLCALC, TRIG, CHOLHDL, LDLDIRECT in the last 72 hours. Thyroid Function Tests: No results for input(s): TSH, T4TOTAL, FREET4, T3FREE, THYROIDAB in the last 72 hours. Anemia Panel: Recent Labs    10/05/18 0710   VITAMINB12 877  FOLATE 22.3  FERRITIN 24  TIBC 281  IRON 55  RETICCTPCT 2.2   Urine analysis:    Component  Value Date/Time   COLORURINE YELLOW 10/04/2018 0112   APPEARANCEUR CLEAR 10/04/2018 0112   LABSPEC 1.029 10/04/2018 0112   PHURINE 6.0 10/04/2018 0112   GLUCOSEU NEGATIVE 10/04/2018 0112   HGBUR NEGATIVE 10/04/2018 0112   HGBUR negative 12/13/2008 1035   BILIRUBINUR NEGATIVE 10/04/2018 0112   BILIRUBINUR neg 04/10/2018 1519   KETONESUR NEGATIVE 10/04/2018 0112   PROTEINUR NEGATIVE 10/04/2018 0112   UROBILINOGEN 0.2 04/10/2018 1519   UROBILINOGEN 0.2 09/18/2011 2049   NITRITE NEGATIVE 10/04/2018 0112   LEUKOCYTESUR NEGATIVE 10/04/2018 0112   Sepsis Labs: Invalid input(s): PROCALCITONIN, LACTICIDVEN  Recent Results (from the past 240 hour(s))  Culture, blood (Routine x 2)     Status: Abnormal (Preliminary result)   Collection Time: 10/03/18  9:30 PM   Specimen: BLOOD RIGHT HAND  Result Value Ref Range Status   Specimen Description BLOOD RIGHT HAND  Final   Special Requests   Final    BOTTLES DRAWN AEROBIC AND ANAEROBIC Blood Culture results may not be optimal due to an excessive volume of blood received in culture bottles   Culture  Setup Time   Final    GRAM POSITIVE COCCI IN CHAINS IN BOTH AEROBIC AND ANAEROBIC BOTTLES CRITICAL VALUE NOTED.  VALUE IS CONSISTENT WITH PREVIOUSLY REPORTED AND CALLED VALUE. Performed at [facility], [place] [address] [place], [place] [address]    Culture STREPTOCOCCUS SALIVARIUS (A)  Final   Report Status PENDING  Incomplete  Culture, blood (Routine x 2)     Status: Abnormal (Preliminary result)   Collection Time: 10/03/18  9:40 PM   Specimen: BLOOD LEFT HAND  Result Value Ref Range Status   Specimen Description BLOOD LEFT HAND  Final   Special Requests   Final    BOTTLES DRAWN AEROBIC ONLY Blood Culture results may not be optimal due to an excessive volume of blood received in culture bottles   Culture  Setup Time   Final     GRAM POSITIVE COCCI IN CHAINS AEROBIC BOTTLE ONLY CRITICAL RESULT CALLED TO, READ BACK BY AND VERIFIED WITH: RHRMD L SEAY @2211  10/04/18 BY S GEZAHEGN    Culture (A)  Final    STREPTOCOCCUS SALIVARIUS SUSCEPTIBILITIES TO FOLLOW Performed at [facility], [place] [address], [place], [place] [address]    Report Status PENDING  Incomplete  Blood Culture ID Panel (Reflexed)     Status: Abnormal   Collection Time: 10/03/18  9:40 PM  Result Value Ref Range Status   Enterococcus species NOT DETECTED NOT DETECTED Final   Listeria monocytogenes NOT DETECTED NOT DETECTED Final   Staphylococcus species NOT DETECTED NOT DETECTED Final   Staphylococcus aureus (BCID) NOT DETECTED NOT DETECTED Final   Streptococcus species DETECTED (A) NOT DETECTED Final    Comment: Not Enterococcus species, Streptococcus agalactiae, Streptococcus pyogenes, or Streptococcus pneumoniae. CRITICAL RESULT CALLED TO, READ BACK BY AND VERIFIED WITH: PHRMD L SEAY @2211  10/04/18 BY S GEZAHEGN    Streptococcus agalactiae NOT DETECTED NOT DETECTED Final   Streptococcus pneumoniae NOT DETECTED NOT DETECTED Final   Streptococcus pyogenes NOT DETECTED NOT DETECTED Final   Acinetobacter baumannii NOT DETECTED NOT DETECTED Final   Enterobacteriaceae species NOT DETECTED NOT DETECTED Final   Enterobacter cloacae complex NOT DETECTED NOT DETECTED Final   Escherichia coli NOT DETECTED NOT DETECTED Final   Klebsiella oxytoca NOT DETECTED NOT DETECTED Final   Klebsiella pneumoniae NOT DETECTED NOT DETECTED Final   Proteus species NOT DETECTED NOT DETECTED Final   Serratia marcescens NOT  DETECTED NOT DETECTED Final   Haemophilus influenzae NOT DETECTED NOT DETECTED Final   Neisseria meningitidis NOT DETECTED NOT DETECTED Final   Pseudomonas aeruginosa NOT DETECTED NOT DETECTED Final   Candida albicans NOT DETECTED NOT DETECTED Final   Candida glabrata NOT DETECTED NOT DETECTED Final   Candida krusei NOT DETECTED NOT DETECTED  Final   Candida parapsilosis NOT DETECTED NOT DETECTED Final   Candida tropicalis NOT DETECTED NOT DETECTED Final    Comment: Performed at [facility], [place] [address] [place], [place] [address]  SARS Coronavirus 2     Status: None   Collection Time: 10/03/18 10:28 PM  Result Value Ref Range Status   SARS Coronavirus 2 NOT DETECTED NOT DETECTED Final    Comment: (NOTE) SARS-CoV-2 target nucleic acids are NOT DETECTED. The SARS-CoV-2 RNA is generally detectable in upper and lower respiratory specimens during the acute phase of infection.  Negative  results do not preclude SARS-CoV-2 infection, do not rule out co-infections with other pathogens, and should not be used as the sole basis for treatment or other patient management decisions.  Negative results must be combined with clinical observations, patient history, and epidemiological information. The expected result is Not Detected. Fact Sheet for Patients: http://www.biofiredefense.com/wp-content/uploads/2020/03/BIOFIRE-COVID -19-patients.pdf Fact Sheet for Healthcare Providers: http://www.biofiredefense.com/wp-content/uploads/2020/03/BIOFIRE-COVID -19-hcp.pdf This test is not yet approved or cleared by the [place] and  has been authorized for detection and/or diagnosis of SARS-CoV-2 by FDA under an Emergency Use Authorization (EUA).  This EUA will remain in effec t (meaning this test can be used) for the duration of  the COVID-19 declaration under Section 564(b)(1) of the Act, 21 U.S.C. section 360bbb-3(b)(1), unless the authorization is terminated or revoked sooner. Performed at [facility], [place] [address] [place], [place] [address]       Radiology Studies: No results found.   Taye T. [facility] Pager [phone]  If 7PM-7AM, please contact night-coverage [email or web address] Password [facility] 10/07/2018, 11:41 AM

## 2018-10-07 NOTE — Care Management Important Message (Signed)
Important Message  Patient Details  Name: Breanna Webster MRN: [id number] Date of Birth: 1946-03-15   Medicare Important Message Given:  Yes    [name] 10/07/2018, 12:49 PM

## 2018-10-07 NOTE — Progress Notes (Signed)
Physical Therapy Treatment Patient Details Name: Breanna Webster MRN: [id number] DOB: 10-10-1945 Today's Date: 10/07/2018    History of Present Illness 73 y.o. female with medical history significant of TAVR 06/2018, CAD, HTN, DM2.  Patient admitted with c/o SOB, AMS and sepsis with elevated troponin due to demand ischemia    PT Comments    Generally steady, needing some use of the rail to get to EOB, cues for hand placement to improve safety with transitions/transfers and progressing gait with/without use of an AD.    Follow Up Recommendations  Home health PT;Supervision/Assistance - 24 hour     Equipment Recommendations  None recommended by PT    Recommendations for Other Services       Precautions / Restrictions Precautions Precautions: Fall    Mobility  Bed Mobility Overal bed mobility: Needs Assistance Bed Mobility: Sidelying to Sit   Sidelying to sit: Supervision(with moderate use of the rail)       General bed mobility comments: HOB 20 degrees  Transfers Overall transfer level: Needs assistance   Transfers: Sit to/from Stand Sit to Stand: Min guard         General transfer comment: min guard for safety.  cues for better hand placement  Ambulation/Gait Ambulation/Gait assistance: Min guard Gait Distance (Feet): 170 Feet Assistive device: None(rails) Gait Pattern/deviations: Step-through pattern Gait velocity: slower Gait velocity interpretation: <1.8 ft/sec, indicate of risk for recurrent falls General Gait Details: episodes of mild instability, generally guarded, reaching for the rails on Journalist, newspaper    Modified Rankin (Stroke Patients Only)       Balance Overall balance assessment: Needs assistance Sitting-balance support: No upper extremity supported Sitting balance-Leahy Scale: Good     Standing balance support: No upper extremity supported;During functional activity Standing  balance-Leahy Scale: Fair                              Cognition Arousal/Alertness: Awake/alert Behavior During Therapy: WFL for tasks assessed/performed Overall Cognitive Status: Impaired/Different from baseline Area of Impairment: Memory;Problem solving;Safety/judgement;Orientation                 Orientation Level: Situation;Time   Memory: Decreased short-term memory   Safety/Judgement: Decreased awareness of deficits;Decreased awareness of safety   Problem Solving: Slow processing        Exercises      General Comments General comments (skin integrity, edema, etc.): sats on RA maintained in the low 90's, EHR 80's and 90's      Pertinent Vitals/Pain Pain Assessment: No/denies pain    Home Living Family/patient expects to be discharged to:: Private residence Living Arrangements: Spouse/significant other                  Prior Function            PT Goals (current goals can now be found in the care plan section) Acute Rehab PT Goals Patient Stated Goal: get home with her husband PT Goal Formulation: With patient Time For Goal Achievement: 10/19/18 Potential to Achieve Goals: Good Progress towards PT goals: Progressing toward goals    Frequency    Min 3X/week      PT Plan Current plan remains appropriate    Co-evaluation              AM-PAC PT "6 Clicks" Mobility   Outcome Measure  Help needed turning from your back to your side while in a flat bed without using bedrails?: None Help needed moving from lying on your back to sitting on the side of a flat bed without using bedrails?: A Little Help needed moving to and from a bed to a chair (including a wheelchair)?: A Little Help needed standing up from a chair using your arms (e.g., wheelchair or bedside chair)?: A Little Help needed to walk in hospital room?: A Little Help needed climbing 3-5 steps with a railing? : A Lot 6 Click Score: 18    End of Session    Activity Tolerance: Patient tolerated treatment well Patient left: in chair;with call bell/phone within reach;with chair alarm set Nurse Communication: Mobility status PT Visit Diagnosis: Other abnormalities of gait and mobility (R26.89);Muscle weakness (generalized) (M62.81)     Time: 5537-4827 PT Time Calculation (min) (ACUTE ONLY): 29 min  Charges:  $Gait Training: 8-22 mins $Therapeutic Activity: 8-22 mins                     10/07/2018  Donnella Sham, PT Acute Rehabilitation Services [phone]  (pager) [phone]  (office)   Breanna Webster 10/07/2018, 4:33 PM

## 2018-10-07 NOTE — Progress Notes (Signed)
Progress Note  Patient Name: Breanna Webster Date of Encounter: 10/07/2018  Primary Cardiologist: Sinclair Grooms, MD   Subjective   No complaints. Finished TEE which was positive for SBE.  Inpatient Medications    Scheduled Meds: . [MAR Hold] aspirin  81 mg Oral Daily  . [MAR Hold] bisoprolol  5 mg Oral Daily  . [MAR Hold] clopidogrel  75 mg Oral Q breakfast  . [MAR Hold] enoxaparin (LOVENOX) injection  40 mg Subcutaneous Q24H  . [MAR Hold] escitalopram  20 mg Oral Daily  . [MAR Hold] guaiFENesin  600 mg Oral BID  . [MAR Hold] insulin aspart  0-15 Units Subcutaneous TID WC  . [MAR Hold] insulin aspart  0-5 Units Subcutaneous QHS  . [MAR Hold] insulin aspart  6 Units Subcutaneous TID WC  . [MAR Hold] insulin glargine  40 Units Subcutaneous Daily  . [MAR Hold] levothyroxine  175 mcg Oral Q0600  . [MAR Hold] multivitamin with minerals  1 tablet Oral Daily  . [MAR Hold] nortriptyline  25 mg Oral QHS  . [MAR Hold] pantoprazole  40 mg Oral BID  . [MAR Hold] sodium chloride flush  3 mL Intravenous Once   Continuous Infusions: . sodium chloride 20 mL/hr at 10/07/18 0927  . [MAR Hold] cefTRIAXone (ROCEPHIN)  IV 2 g (10/06/18 2217)   PRN Meds: [MAR Hold] acetaminophen, [MAR Hold] ipratropium-albuterol   Vital Signs    Vitals:   10/07/18 1023 10/07/18 1031 10/07/18 1041 10/07/18 1051  BP: (!) 159/51 (!) 147/48 (!) 149/45 (!) 148/44  Pulse: 64 65 (!) 57 (!) 57  Resp: (!) 21 (!) _0 Temp:  98.4 F (36.9 C)    TempSrc:      SpO2: 99% 93% 99% 97%  Weight:      Height:        Intake/Output Summary (Last 24 hours) at 10/07/2018 1056 Last data filed at 10/07/2018 0600 Gross per 24 hour  Intake 277.04 ml  Output -  Net 277.04 ml   Last 3 Weights 10/05/2018 10/04/2018 10/03/2018  Weight (lbs) 203 lb 11.3 oz 204 lb 6.4 oz 202 lb  Weight (kg) 92.4 kg 92.715 kg 91.627 kg      Telemetry    SR - Personally Reviewed  ECG    N/a - Personally Reviewed  Physical  Exam   2/6 systolic murmur without diastolic component  Labs    Chemistry Recent Labs  Lab 10/03/18 2136 10/04/18 0750 10/05/18 0433 10/06/18 0631  NA 132* 135 138 138  K 3.9 3.6 3.3* 3.5  CL 99 105 108 107  CO2 19* 20* 21* 23  GLUCOSE 143* 147* 110* 115*  BUN 25* 21 11 6*  CREATININE 1.02* 0.91 0.74 0.69  CALCIUM 9.6 8.4* 8.9 9.4  PROT 7.3 6.1* 6.0*  --   ALBUMIN 3.1* 2.7* 2.6*  --   AST 46* 38 34  --   ALT 41 30 26  --   ALKPHOS 85 78 66  --   BILITOT 1.9* 1.5* 0.9  --   GFRNONAA 54* >60 >60 >60  GFRAA >60 >60 >60 >60  ANIONGAP _1 Hematology Recent Labs  Lab 10/05/18 0433 10/05/18 0710 10/06/18 0631 10/07/18 0412  WBC 8.4  --  7.3 6.8  RBC 4.57 4.99 4.70 4.66  HGB 8.6*  --  9.0* 8.8*  HCT 28.4*  --  29.2* 28.6*  MCV 62.1*  --  62.1* 61.4*  MCH 18.8*  --  19.1* 18.9*  MCHC 30.3  --  30.8 30.8  RDW 15.9*  --  15.9* 15.9*  PLT 146*  --  175 177    Cardiac Enzymes Recent Labs  Lab 10/03/18 2136 10/04/18 0100 10/04/18 0440  TROPONINI 0.98* 0.91* 0.83*   No results for input(s): TROPIPOC in the last 168 hours.   BNPNo results for input(s): BNP, PROBNP in the last 168 hours.   DDimer No results for input(s): DDIMER in the last 168 hours.   Radiology    No results found.  Cardiac Studies   TTE: Not yet officially reported. Heavy MAC and no vegetation identifiable. TEE: Definite vegetation  Patient Profile     73 y.o. female with a history of CAD, s/p PCI in 2019, AS s/p TAVR.  Now admitted with sepsis/ pneumonia and positive troponin.   Assessment & Plan    1. Elevated troponin: No work-up is recommended. 2. CAD: No symptoms of angina. 3. Sepsis with TEE documented Infective endocarditis.: antibiotics per primary/infectious disease. No obvious TAVR valve infection. 4. AS s/ TAVR: No evidence of TAVR valve infection per TEE. 5. Acute Encephalopathy: Improved sensorium and cognitive function.   For questions or updates, please  contact [place] Please consult [email or web address] for contact info under        Signed, Sinclair Grooms, MD  10/07/2018, 10:56 AM

## 2018-10-07 NOTE — Progress Notes (Addendum)
Pharmacy Antibiotic Note  Breanna Webster is a 73 y.o. female admitted on 10/03/2018 with bacteremia/endocarditis.  Pharmacy has been consulted for gentamicin dosing.  Patient remains afebrile, with WBC 6.8. Of note she has a TAVR placed March 2020. TEE from 6/16 revealed a large mitral valve vegetation.   Scr has remained stable at 0.64.  Due to discharge planning, a gentamicin trough was drawn before 4th dose and was high at 1.4.  Plan: Decrease gentamicin to 60m IV Q 24 hr Goal peak 3-4, goal trough <1  Monitor renal function, drug levels, and clinical response  Height: 5' 5"  (165.1 cm) Weight: 203 lb 11.3 oz (92.4 kg) IBW/kg (Calculated) : 57  Temp (24hrs), Avg:98.1 F (36.7 C), Min:97.7 F (36.5 C), Max:98.4 F (36.9 C)  Recent Labs  Lab 10/03/18 2136 10/03/18 2137 10/04/18 0100 10/04/18 0750 10/05/18 0433 10/06/18 0631 10/07/18 0412  WBC 16.2*  --   --  12.9* 8.4 7.3 6.8  CREATININE 1.02*  --   --  0.91 0.74 0.69  --   LATICACIDVEN  --  4.1* 2.4*  --   --   --   --     Estimated Creatinine Clearance: 69.3 mL/min (by C-G formula based on SCr of 0.69 mg/dL).    Allergies  Allergen Reactions  . Statins Other (See Comments)    Muscle aches    Antimicrobials this admission: 6/16 Gentamicin >> (6/30) 6/13 Ceftriaxone>> (7/28) 6/12 Vancomcyin >>6/13 6/13 Zosyn >>6/13 6/13 Acyclovir x 2 6/13 Doxycyline >>6/14 6/12 Cefepime x 1 6/12 Flagyl x 1  Dose adjustments this admission: 6/16-6/18 Gentamicin 828mBID   Microbiology results: 6/12 Bcx: Streptococcus salivarius (3/4 bottles) 6/15 Bcx: NGTD  Thank you for allowing pharmacy to be a part of this patient's care.  AnAzzie Roup PGY1 Pharmacy Resident  Phone ([phone] use [facility]ION for clinical pharmacists numbers  10/07/2018      4:05 PM

## 2018-10-07 NOTE — Progress Notes (Addendum)
[facility]   Date of Admission: 10/03/2018   Antibiotic Days:   Ceftriaxone   Gentamicin 1   Reason for visit: Follow up on bacteremia with TAVR valve    Interval History:  TEE + for large(1.8 cm x 1.8 cm) native mitral valve vegetation. Mild to moderate regurgitation present. No vegetation or new regurgitation involving TAVR.   She is wondering when she can go home and would like something to eat and cold to drink. She had a dental implant that fell out prior to TAVR a month ago.    Review of Systems: Constitutional: negative for fevers and chills Gastrointestinal: negative for nausea and diarrhea Integument/breast: negative for rash  Physical Exam: Vitals:   10/07/18 1051 10/07/18 1108  BP: (!) 148/44 (!) 151/68  Pulse: (!) 57 61  Resp: 17 18  Temp:  98.3 F (36.8 C)  SpO2: 97% 96%   Constitutional: Sleepy after returning from TEE. patient appears in NAD Eyes: anicteric HENT: no thrush Respiratory: Normal respiratory effort; CTA B Cardiovascular: RRR, +systolic murmur 0-9/3 GI: soft, nt, nd   Lab Results  Component Value Date   WBC 6.8 10/07/2018   HGB 8.8 (L) 10/07/2018   HCT 28.6 (L) 10/07/2018   MCV 61.4 (L) 10/07/2018   PLT 177 10/07/2018    Lab Results  Component Value Date   CREATININE 0.69 10/06/2018   BUN 6 (L) 10/06/2018   NA 138 10/06/2018   K 3.5 10/06/2018   CL 107 10/06/2018   CO2 23 10/06/2018    Lab Results  Component Value Date   ALT 26 10/05/2018   AST 34 10/05/2018   ALKPHOS 66 10/05/2018     Microbiology: Recent Results (from the past 240 hour(s))  Culture, blood (Routine x 2)     Status: Abnormal   Collection Time: 10/03/18  9:30 PM   Specimen: BLOOD RIGHT HAND  Result Value Ref Range Status   Specimen Description BLOOD RIGHT HAND  Final   Special Requests   Final    BOTTLES DRAWN AEROBIC AND ANAEROBIC Blood Culture results may not be optimal due to an excessive volume of blood received in  culture bottles   Culture  Setup Time   Final    GRAM POSITIVE COCCI IN CHAINS IN BOTH AEROBIC AND ANAEROBIC BOTTLES CRITICAL VALUE NOTED.  VALUE IS CONSISTENT WITH PREVIOUSLY REPORTED AND CALLED VALUE.    Culture (A)  Final    STREPTOCOCCUS SALIVARIUS SUSCEPTIBILITIES PERFORMED ON PREVIOUS CULTURE WITHIN THE LAST 5 DAYS. Performed at [facility], [place] [address], [place], [place] [address]    Report Status 10/07/2018 FINAL  Final  Culture, blood (Routine x 2)     Status: Abnormal   Collection Time: 10/03/18  9:40 PM   Specimen: BLOOD LEFT HAND  Result Value Ref Range Status   Specimen Description BLOOD LEFT HAND  Final   Special Requests   Final    BOTTLES DRAWN AEROBIC ONLY Blood Culture results may not be optimal due to an excessive volume of blood received in culture bottles   Culture  Setup Time   Final    GRAM POSITIVE COCCI IN CHAINS AEROBIC BOTTLE ONLY CRITICAL RESULT CALLED TO, READ BACK BY AND VERIFIED WITH: RHRMD L SEAY @2211  10/04/18 BY S GEZAHEGN Performed at [facility], [place] [address], [place], [place] [address]    Culture STREPTOCOCCUS SALIVARIUS (A)  Final   Report Status 10/07/2018 FINAL  Final  Organism ID, Bacteria STREPTOCOCCUS SALIVARIUS  Final      Susceptibility   Streptococcus salivarius - MIC*    PENICILLIN 0.5 INTERMEDIATE Intermediate     CEFTRIAXONE 0.25 SENSITIVE Sensitive     ERYTHROMYCIN 2 RESISTANT Resistant     LEVOFLOXACIN 0.5 SENSITIVE Sensitive     VANCOMYCIN 0.5 SENSITIVE Sensitive     * STREPTOCOCCUS SALIVARIUS  Blood Culture ID Panel (Reflexed)     Status: Abnormal   Collection Time: 10/03/18  9:40 PM  Result Value Ref Range Status   Enterococcus species NOT DETECTED NOT DETECTED Final   Listeria monocytogenes NOT DETECTED NOT DETECTED Final   Staphylococcus species NOT DETECTED NOT DETECTED Final   Staphylococcus aureus (BCID) NOT DETECTED NOT DETECTED Final   Streptococcus species DETECTED (A) NOT DETECTED Final     Comment: Not Enterococcus species, Streptococcus agalactiae, Streptococcus pyogenes, or Streptococcus pneumoniae. CRITICAL RESULT CALLED TO, READ BACK BY AND VERIFIED WITH: PHRMD L SEAY @2211  10/04/18 BY S GEZAHEGN    Streptococcus agalactiae NOT DETECTED NOT DETECTED Final   Streptococcus pneumoniae NOT DETECTED NOT DETECTED Final   Streptococcus pyogenes NOT DETECTED NOT DETECTED Final   Acinetobacter baumannii NOT DETECTED NOT DETECTED Final   Enterobacteriaceae species NOT DETECTED NOT DETECTED Final   Enterobacter cloacae complex NOT DETECTED NOT DETECTED Final   Escherichia coli NOT DETECTED NOT DETECTED Final   Klebsiella oxytoca NOT DETECTED NOT DETECTED Final   Klebsiella pneumoniae NOT DETECTED NOT DETECTED Final   Proteus species NOT DETECTED NOT DETECTED Final   Serratia marcescens NOT DETECTED NOT DETECTED Final   Haemophilus influenzae NOT DETECTED NOT DETECTED Final   Neisseria meningitidis NOT DETECTED NOT DETECTED Final   Pseudomonas aeruginosa NOT DETECTED NOT DETECTED Final   Candida albicans NOT DETECTED NOT DETECTED Final   Candida glabrata NOT DETECTED NOT DETECTED Final   Candida krusei NOT DETECTED NOT DETECTED Final   Candida parapsilosis NOT DETECTED NOT DETECTED Final   Candida tropicalis NOT DETECTED NOT DETECTED Final    Comment: Performed at [facility], [place]. [address] [place], [place] [address]  SARS Coronavirus 2     Status: None   Collection Time: 10/03/18 10:28 PM  Result Value Ref Range Status   SARS Coronavirus 2 NOT DETECTED NOT DETECTED Final    Comment: (NOTE) SARS-CoV-2 target nucleic acids are NOT DETECTED. The SARS-CoV-2 RNA is generally detectable in upper and lower respiratory specimens during the acute phase of infection.  Negative  results do not preclude SARS-CoV-2 infection, do not rule out co-infections with other pathogens, and should not be used as the sole basis for treatment or other patient management decisions.  Negative  results must be combined with clinical observations, patient history, and epidemiological information. The expected result is Not Detected. Fact Sheet for Patients: http://www.biofiredefense.com/wp-content/uploads/2020/03/BIOFIRE-COVID -19-patients.pdf Fact Sheet for Healthcare Providers: http://www.biofiredefense.com/wp-content/uploads/2020/03/BIOFIRE-COVID -19-hcp.pdf This test is not yet approved or cleared by the [place] and  has been authorized for detection and/or diagnosis of SARS-CoV-2 by FDA under an Emergency Use Authorization (EUA).  This EUA will remain in effec t (meaning this test can be used) for the duration of  the COVID-19 declaration under Section 564(b)(1) of the Act, 21 U.S.C. section 360bbb-3(b)(1), unless the authorization is terminated or revoked sooner. Performed at [facility], [place] [address] [place], [place] [address]   Culture, blood (routine x 2)     Status: None (Preliminary result)   Collection Time: 10/06/18  6:31 AM  Specimen: BLOOD  Result Value Ref Range Status   Specimen Description BLOOD RIGHT ANTECUBITAL  Final   Special Requests   Final    BOTTLES DRAWN AEROBIC ONLY Blood Culture adequate volume   Culture   Final    NO GROWTH 1 DAY Performed at [facility], [address], [place], [place] [address]    Report Status PENDING  Incomplete  Culture, blood (routine x 2)     Status: None (Preliminary result)   Collection Time: 10/06/18  6:31 AM   Specimen: BLOOD  Result Value Ref Range Status   Specimen Description BLOOD LEFT ANTECUBITAL  Final   Special Requests   Final    BOTTLES DRAWN AEROBIC ONLY Blood Culture adequate volume   Culture   Final    NO GROWTH 1 DAY Performed at [facility], [place] [employer] [address], [place], [place] [address]    Report Status PENDING  Incomplete    Impression/Plan: 1. Streptococcus Salivarius Bacteremia - PCN MIC elevated, will add gentamicin x 2 weeks for dual therapy especially  considering new TAVR.   2. Native Mitral Valve Endocarditis = large vegetation on mitral valve noted. Please let CT surgery and TAVR team to ensure they are aware. Head CT scan w/o signs of embolization. Will check oropantogram (sounds like she has some dental needs she was planning on flying to [place] for).   3.  Access - will need picc line if blood cultures remain negative at least 48 hours.    4. Medication Monitoring - will need close kidney function monitoring while on Gentamicin therapy.   Janene Madeira, MSN, NP-C [facility] [email or web address] Pager: [phone] Office: [phone] [place]: [phone]

## 2018-10-07 NOTE — Progress Notes (Signed)
  Echocardiogram Echocardiogram Transesophageal has been performed.  Breanna Webster L Androw 10/07/2018, 10:46 AM

## 2018-10-07 NOTE — Interval H&P Note (Signed)
History and Physical Interval Note:  10/07/2018 9:55 AM  Breanna Webster  has presented today for surgery, with the diagnosis of Bacteremia.  The various methods of treatment have been discussed with the patient and family. After consideration of risks, benefits and other options for treatment, the patient has consented to  Procedure(s): TRANSESOPHAGEAL ECHOCARDIOGRAM (TEE) (N/A) as a surgical intervention.  The patient's history has been reviewed, patient examined, no change in status, stable for surgery.  I have reviewed the patient's chart and labs.  Questions were answered to the patient's satisfaction.     [employer]

## 2018-10-07 NOTE — Progress Notes (Signed)
Pt concerned about not getting lunch. I have called and ordered a meal by her request. She is on a Kosher diet and limited. I explained that pt would need to be fully awake when eating. Pt is somewhat sleepy still from procedure. Pt aware and wants to eat when food arrives. Pt on phone with husband while food ordered. Pt resting with call bell within reach.  Will continue to monitor.

## 2018-10-07 NOTE — Telephone Encounter (Signed)
Attempted to contact pt in regards to VCR, pt was not available. Per pt husband pt is currently in the hospital for TEE (10/07/2018). Adv husband I will contact pt at a later date.

## 2018-10-07 NOTE — Plan of Care (Signed)
  Problem: Clinical Measurements: Goal: Signs and symptoms of infection will decrease Outcome: Completed/Met   Problem: Respiratory: Goal: Ability to maintain adequate ventilation will improve Outcome: Completed/Met   Problem: Activity: Goal: Risk for activity intolerance will decrease Outcome: Completed/Met

## 2018-10-08 DIAGNOSIS — I339 Acute and subacute endocarditis, unspecified: Secondary | ICD-10-CM

## 2018-10-08 DIAGNOSIS — I340 Nonrheumatic mitral (valve) insufficiency: Secondary | ICD-10-CM

## 2018-10-08 LAB — GLUCOSE, CAPILLARY
Glucose-Capillary: 95 mg/dL (ref 70–99)
Glucose-Capillary: 160 mg/dL — ABNORMAL HIGH (ref 70–99)
Glucose-Capillary: 136 mg/dL — ABNORMAL HIGH (ref 70–99)
Glucose-Capillary: 146 mg/dL — ABNORMAL HIGH (ref 70–99)

## 2018-10-08 LAB — CBC
HCT: 29.8 % — ABNORMAL LOW (ref 36.0–46.0)
Hemoglobin: 9.1 g/dL — ABNORMAL LOW (ref 12.0–15.0)
MCH: 19 pg — ABNORMAL LOW (ref 26.0–34.0)
MCHC: 30.5 g/dL (ref 30.0–36.0)
MCV: 62.3 fL — ABNORMAL LOW (ref 80.0–100.0)
Platelets: 198 10*3/uL (ref 150–400)
RBC: 4.78 MIL/uL (ref 3.87–5.11)
RDW: 16.7 % — ABNORMAL HIGH (ref 11.5–15.5)
WBC: 8.6 10*3/uL (ref 4.0–10.5)
nRBC: 0.5 % — ABNORMAL HIGH (ref 0.0–0.2)

## 2018-10-08 LAB — PROCALCITONIN: Procalcitonin: 0.55 ng/mL

## 2018-10-08 LAB — MAGNESIUM: Magnesium: 1.8 mg/dL (ref 1.7–2.4)

## 2018-10-08 MED ORDER — SODIUM CHLORIDE 0.9 % IV BOLUS
500.0000 mL | Freq: Two times a day (BID) | INTRAVENOUS | Status: DC
  Administered 2018-10-08 – 2018-10-10 (×5): 500 mL via INTRAVENOUS

## 2018-10-08 MED ORDER — FERROUS SULFATE 325 (65 FE) MG PO TABS
325.0000 mg | ORAL_TABLET | Freq: Two times a day (BID) | ORAL | Status: DC
  Filled 2018-10-08: qty 1

## 2018-10-08 NOTE — Progress Notes (Signed)
PROGRESS NOTE  Breanna Webster LNL:[id number] DOB: Jan 26, 1946 DOA: 10/03/2018 PCP: [name], NP  HPI/Recap of past 32 hours: 73 year old female with history of AS s/p TAVR in 3/20, CAD s/p stent to RCA in 03/2018, HTN, PHTN, DM-2, hypothyroidism, LBBB and GERD admitted with acute shortness of breath, fever and altered mental status.  Reportedly oriented to person and place only on admission. In the ED, hemodynamically stable except for RR to 31.  Oxygen saturation 93% on 2 L.  Leukocytosis to 16,000.  Lactate of 4.1.  Elevated to 0.98.  CXR, CT head, CTA chest/abdomen/pelvis without acute significant finding.  COVID-19 negative.  Urinalysis negative.  Received 4 L of IV fluid bolus and lactate trended down to 2.4.  Blood cultures drawn.  Started on broad-spectrum antibiotics including acyclovir.  Cardiology consulted.  Patient was admitted for undifferentiated sepsis. Blood culture grew GPC/Streptococcus species in 3 out of 4.  Antibiotic de-escalated to ceftriaxone .  Doxycycline discontinued.  TEE 6/1 with evidence of infective endocarditis, no obvious TAVR valve infection..  10/08/18: Patient seen and examined at bedside.  She is alert and interactive.  Denies chills.  Afebrile overnight.  On IV gentamicin and Rocephin for strep salvarium bacteremia.  ID following.    Assessment/Plan: Principal Problem:   Bacteremia due to Streptococcus Salivarius Active Problems:   Diabetes mellitus due to underlying condition, uncontrolled ([place])   Essential hypertension   Pulmonary hypertension (HCC)   CAD in native artery   S/P TAVR (transcatheter aortic valve replacement)   Sepsis ([place])   Acute metabolic encephalopathy   Endocarditis of mitral valve  Strep salivarus bacteremia with infective endocarditis Blood culture on 6/12, positive for Streptococcus salivarius Underwent TEE6/16 that shows IE but no obvious TAVR valve infection.   Infectious disease following Continue gentamicin and  Rocephin as recommended by infectious disease.   Repeat blood cultures x2 done on 10/06/2018- x1 day Would like to see negative blood culture at least x 2 days prior to PICC line placement.   Afebrile      Acute metabolic encephalopathy likely secondary to bacteremia Appears to be improving Continue IV antibiotics as indicated CT head unremarkable for any acute intracranial findings   Elevated troponin likely secondary to demand ischemia from infection/bacteremia Troponin peaked at 0.98 and trended down Cardiology has been consulted and following No work-up recommended per cardiology  Aortic stenosis status post TAVR No evidence of TAVR valve infection per TEE  CAD s/p RCA stent in 03/2018:  Denies chest pain Continue cardiac medications On Plavix, aspirin, bisoprolol  Patient is intolerant of statin.    Hypertension:  Is normotensive.   On bisoprolol  Home losartan and HCTZ on hold for now  Intermittent bradycardia, likely iatrogenic from beta-blocker On bisoprolol Heart rate in the mid to upper 50s Closely monitor if needed cut down dose  Poorly controlled IDDM-2 with hyperglycemia: A1c 8.5%.  On  NPH 120 units daily: Blood sugar controlled, continue Lantus 40 units daily, novolog 6 units with meals, and sliding scale insulin and monitor Accu-Chek..   Iron deficiency anemia/anemia of chronic disease: Hemoglobin 9.1 from 8.8 yesterday.    Low MCV 62.  Iron study with evidence of iron deficiency.  Start ferrous sulfate 325 mg twice daily.     Resolved hyponatremia:   Hypothyroidism:  Continue Synthroid  Depression: Clinically stable on Lexapro and nortriptyline.  GERD: on PPI  DVT prophylaxis: Lovonex Code Status: Full code Family Communication: Updated patient's husband over the phone by previous attending.  Discussed plan of care with the patient patient reports she can handle IV antibiotics at home Disposition Plan: Remains inpatient pending clinical  improvement.  Remains on IV antibiotics. cont PT eval.  Consultants:   Cardiology  Infectious disease  Procedures:   TEE 6/16 REPORT PENDING  Microbiology:  [id number] negative  Blood culture-Streptococcus species  Antimicrobials:  -Doxycycline 5/13-5/14.  -Ceftriaxone 5/14 .> -Acyclovir discontinued on 6/13.   Objective: Vitals:   10/07/18 1108 10/07/18 1925 10/08/18 0304 10/08/18 0942  BP: (!) 151/68 (!) 152/64 (!) 139/58 127/62  Pulse: 61 65 (!) 58   Resp: 18 19 20    Temp: 98.3 F (36.8 C) 97.9 F (36.6 C) 97.9 F (36.6 C) 97.8 F (36.6 C)  TempSrc: Oral Oral Oral Oral  SpO2: 96% 96% 95%   Weight:      Height:       No intake or output data in the 24 hours ending 10/08/18 1006 Filed Weights   10/03/18 2300 10/04/18 0419 10/05/18 0329  Weight: 91.6 kg 92.7 kg 92.4 kg    Exam:  . General: 73 y.o. year-old female well developed well nourished in no acute distress.  Alert and interactive. . Cardiovascular: Regular rate and rhythm with no rubs or gallops.  No thyromegaly or JVD noted.   Marland Kitchen Respiratory: Faint mild rales at bases.  With no wheezes or rales. Good inspiratory effort. . Abdomen: Soft nontender nondistended with normal bowel sounds x4 quadrants. . Musculoskeletal: Trace lower extremity edema. 2/4 pulses in all 4 extremities. Marland Kitchen Psychiatry: Mood is appropriate for condition and setting   Data Reviewed: CBC: Recent Labs  Lab 10/03/18 2136 10/04/18 0750 10/05/18 0433 10/06/18 0631 10/07/18 0412 10/08/18 0524  WBC 16.2* 12.9* 8.4 7.3 6.8 8.6  NEUTROABS 14.6*  --   --   --   --   --   HGB 10.6* 9.3* 8.6* 9.0* 8.8* 9.1*  HCT 33.9* 31.0* 28.4* 29.2* 28.6* 29.8*  MCV 61.4* 62.5* 62.1* 62.1* 61.4* 62.3*  PLT 208 167 146* 175 177 237   Basic Metabolic Panel: Recent Labs  Lab 10/03/18 2136 10/04/18 0750 10/05/18 0433 10/06/18 0631 10/07/18 0412 10/08/18 0524  NA 132* 135 138 138  --   --   K 3.9 3.6 3.3* 3.5  --   --   CL 99 105 108  107  --   --   CO2 19* 20* 21* 23  --   --   GLUCOSE 143* 147* 110* 115*  --   --   BUN 25* 21 11 6*  --   --   CREATININE 1.02* 0.91 0.74 0.69  --   --   CALCIUM 9.6 8.4* 8.9 9.4  --   --   MG  --   --  2.0 1.9 1.7 1.8   GFR: Estimated Creatinine Clearance: 69.3 mL/min (by C-G formula based on SCr of 0.69 mg/dL). Liver Function Tests: Recent Labs  Lab 10/03/18 2136 10/04/18 0750 10/05/18 0433  AST 46* 38 34  ALT 41 30 26  ALKPHOS 85 78 66  BILITOT 1.9* 1.5* 0.9  PROT 7.3 6.1* 6.0*  ALBUMIN 3.1* 2.7* 2.6*   No results for input(s): LIPASE, AMYLASE in the last 168 hours. No results for input(s): AMMONIA in the last 168 hours. Coagulation Profile: Recent Labs  Lab 10/03/18 2136 10/04/18 0750  INR 1.3* 1.5*   Cardiac Enzymes: Recent Labs  Lab 10/03/18 2136 10/04/18 0100 10/04/18 0440  TROPONINI 0.98* 0.91* 0.83*   BNP (last 3  results) Recent Labs    02/19/18 1233  PROBNP 270   HbA1C: No results for input(s): HGBA1C in the last 72 hours. CBG: Recent Labs  Lab 10/07/18 0626 10/07/18 1131 10/07/18 1621 10/07/18 2105 10/08/18 0630  GLUCAP 114* 120* 213* 157* 95   Lipid Profile: No results for input(s): CHOL, HDL, LDLCALC, TRIG, CHOLHDL, LDLDIRECT in the last 72 hours. Thyroid Function Tests: No results for input(s): TSH, T4TOTAL, FREET4, T3FREE, THYROIDAB in the last 72 hours. Anemia Panel: No results for input(s): VITAMINB12, FOLATE, FERRITIN, TIBC, IRON, RETICCTPCT in the last 72 hours. Urine analysis:    Component Value Date/Time   COLORURINE YELLOW 10/04/2018 0112   APPEARANCEUR CLEAR 10/04/2018 0112   LABSPEC 1.029 10/04/2018 0112   PHURINE 6.0 10/04/2018 0112   GLUCOSEU NEGATIVE 10/04/2018 0112   HGBUR NEGATIVE 10/04/2018 0112   HGBUR negative 12/13/2008 1035   BILIRUBINUR NEGATIVE 10/04/2018 0112   BILIRUBINUR neg 04/10/2018 1519   KETONESUR NEGATIVE 10/04/2018 0112   PROTEINUR NEGATIVE 10/04/2018 0112   UROBILINOGEN 0.2 04/10/2018 1519    UROBILINOGEN 0.2 09/18/2011 2049   NITRITE NEGATIVE 10/04/2018 0112   LEUKOCYTESUR NEGATIVE 10/04/2018 0112   Sepsis Labs: @LABRCNTIP (procalcitonin:4,lacticidven:4)  ) Recent Results (from the past 240 hour(s))  Culture, blood (Routine x 2)     Status: Abnormal   Collection Time: 10/03/18  9:30 PM   Specimen: BLOOD RIGHT HAND  Result Value Ref Range Status   Specimen Description BLOOD RIGHT HAND  Final   Special Requests   Final    BOTTLES DRAWN AEROBIC AND ANAEROBIC Blood Culture results may not be optimal due to an excessive volume of blood received in culture bottles   Culture  Setup Time   Final    GRAM POSITIVE COCCI IN CHAINS IN BOTH AEROBIC AND ANAEROBIC BOTTLES CRITICAL VALUE NOTED.  VALUE IS CONSISTENT WITH PREVIOUSLY REPORTED AND CALLED VALUE.    Culture (A)  Final    STREPTOCOCCUS SALIVARIUS SUSCEPTIBILITIES PERFORMED ON PREVIOUS CULTURE WITHIN THE LAST 5 DAYS. Performed at [facility], [place] [address], [place], [place] [address]    Report Status 10/07/2018 FINAL  Final  Culture, blood (Routine x 2)     Status: Abnormal   Collection Time: 10/03/18  9:40 PM   Specimen: BLOOD LEFT HAND  Result Value Ref Range Status   Specimen Description BLOOD LEFT HAND  Final   Special Requests   Final    BOTTLES DRAWN AEROBIC ONLY Blood Culture results may not be optimal due to an excessive volume of blood received in culture bottles   Culture  Setup Time   Final    GRAM POSITIVE COCCI IN CHAINS AEROBIC BOTTLE ONLY CRITICAL RESULT CALLED TO, READ BACK BY AND VERIFIED WITH: RHRMD L SEAY @2211  10/04/18 BY S GEZAHEGN Performed at [facility], [place] [address] [place], [place] [address]    Culture STREPTOCOCCUS SALIVARIUS (A)  Final   Report Status 10/07/2018 FINAL  Final   Organism ID, Bacteria STREPTOCOCCUS SALIVARIUS  Final      Susceptibility   Streptococcus salivarius - MIC*    PENICILLIN 0.5 INTERMEDIATE Intermediate     CEFTRIAXONE 0.25 SENSITIVE Sensitive      ERYTHROMYCIN 2 RESISTANT Resistant     LEVOFLOXACIN 0.5 SENSITIVE Sensitive     VANCOMYCIN 0.5 SENSITIVE Sensitive     * STREPTOCOCCUS SALIVARIUS  Blood Culture ID Panel (Reflexed)     Status: Abnormal   Collection Time: 10/03/18  9:40 PM  Result Value Ref Range  Status   Enterococcus species NOT DETECTED NOT DETECTED Final   Listeria monocytogenes NOT DETECTED NOT DETECTED Final   Staphylococcus species NOT DETECTED NOT DETECTED Final   Staphylococcus aureus (BCID) NOT DETECTED NOT DETECTED Final   Streptococcus species DETECTED (A) NOT DETECTED Final    Comment: Not Enterococcus species, Streptococcus agalactiae, Streptococcus pyogenes, or Streptococcus pneumoniae. CRITICAL RESULT CALLED TO, READ BACK BY AND VERIFIED WITH: PHRMD L SEAY @2211  10/04/18 BY S GEZAHEGN    Streptococcus agalactiae NOT DETECTED NOT DETECTED Final   Streptococcus pneumoniae NOT DETECTED NOT DETECTED Final   Streptococcus pyogenes NOT DETECTED NOT DETECTED Final   Acinetobacter baumannii NOT DETECTED NOT DETECTED Final   Enterobacteriaceae species NOT DETECTED NOT DETECTED Final   Enterobacter cloacae complex NOT DETECTED NOT DETECTED Final   Escherichia coli NOT DETECTED NOT DETECTED Final   Klebsiella oxytoca NOT DETECTED NOT DETECTED Final   Klebsiella pneumoniae NOT DETECTED NOT DETECTED Final   Proteus species NOT DETECTED NOT DETECTED Final   Serratia marcescens NOT DETECTED NOT DETECTED Final   Haemophilus influenzae NOT DETECTED NOT DETECTED Final   Neisseria meningitidis NOT DETECTED NOT DETECTED Final   Pseudomonas aeruginosa NOT DETECTED NOT DETECTED Final   Candida albicans NOT DETECTED NOT DETECTED Final   Candida glabrata NOT DETECTED NOT DETECTED Final   Candida krusei NOT DETECTED NOT DETECTED Final   Candida parapsilosis NOT DETECTED NOT DETECTED Final   Candida tropicalis NOT DETECTED NOT DETECTED Final    Comment: Performed at [facility], [place]. [address] [place], [place] [address]   SARS Coronavirus 2     Status: None   Collection Time: 10/03/18 10:28 PM  Result Value Ref Range Status   SARS Coronavirus 2 NOT DETECTED NOT DETECTED Final    Comment: (NOTE) SARS-CoV-2 target nucleic acids are NOT DETECTED. The SARS-CoV-2 RNA is generally detectable in upper and lower respiratory specimens during the acute phase of infection.  Negative  results do not preclude SARS-CoV-2 infection, do not rule out co-infections with other pathogens, and should not be used as the sole basis for treatment or other patient management decisions.  Negative results must be combined with clinical observations, patient history, and epidemiological information. The expected result is Not Detected. Fact Sheet for Patients: http://www.biofiredefense.com/wp-content/uploads/2020/03/BIOFIRE-COVID -19-patients.pdf Fact Sheet for Healthcare Providers: http://www.biofiredefense.com/wp-content/uploads/2020/03/BIOFIRE-COVID -19-hcp.pdf This test is not yet approved or cleared by the [place] and  has been authorized for detection and/or diagnosis of SARS-CoV-2 by FDA under an Emergency Use Authorization (EUA).  This EUA will remain in effec t (meaning this test can be used) for the duration of  the COVID-19 declaration under Section 564(b)(1) of the Act, 21 U.S.C. section 360bbb-3(b)(1), unless the authorization is terminated or revoked sooner. Performed at [facility], [place] [address], [place], [place] [address]   Culture, blood (routine x 2)     Status: None (Preliminary result)   Collection Time: 10/06/18  6:31 AM   Specimen: BLOOD  Result Value Ref Range Status   Specimen Description BLOOD RIGHT ANTECUBITAL  Final   Special Requests   Final    BOTTLES DRAWN AEROBIC ONLY Blood Culture adequate volume   Culture   Final    NO GROWTH 1 DAY Performed at [facility], [place] [address] [place], [place] [address]    Report Status PENDING  Incomplete  Culture, blood  (routine x 2)     Status: None (Preliminary result)   Collection Time: 10/06/18  6:31  AM   Specimen: BLOOD  Result Value Ref Range Status   Specimen Description BLOOD LEFT ANTECUBITAL  Final   Special Requests   Final    BOTTLES DRAWN AEROBIC ONLY Blood Culture adequate volume   Culture   Final    NO GROWTH 1 DAY Performed at [facility], [address], [place], [place] [address]    Report Status PENDING  Incomplete      Studies: Dg Orthopantogram  Result Date: 10/07/2018 CLINICAL DATA:  Mitral valve endocarditis. EXAM: ORTHOPANTOGRAM/PANORAMIC COMPARISON:  None. FINDINGS: Multiple dental implants and amalgam are noted. No dental caries or periapical lucencies. IMPRESSION: No evidence of tooth infection. Electronically Signed   By: Titus Dubin M.D.   On: 10/07/2018 19:43    Scheduled Meds: . aspirin  81 mg Oral Daily  . bisoprolol  5 mg Oral Daily  . clopidogrel  75 mg Oral Q breakfast  . enoxaparin (LOVENOX) injection  40 mg Subcutaneous Q24H  . escitalopram  20 mg Oral Daily  . guaiFENesin  600 mg Oral BID  . insulin aspart  0-15 Units Subcutaneous TID WC  . insulin aspart  0-5 Units Subcutaneous QHS  . insulin aspart  6 Units Subcutaneous TID WC  . insulin glargine  40 Units Subcutaneous Daily  . levothyroxine  175 mcg Oral Q0600  . multivitamin with minerals  1 tablet Oral Daily  . nortriptyline  25 mg Oral QHS  . pantoprazole  40 mg Oral BID  . sodium chloride flush  3 mL Intravenous Once    Continuous Infusions: . cefTRIAXone (ROCEPHIN)  IV 2 g (10/07/18 2329)  . gentamicin 80 mg (10/07/18 2113)  . sodium chloride       LOS: 4 days     Kayleen Memos, MD [facility] Pager [phone]  If 7PM-7AM, please contact night-coverage [email or web address] Password [email or web address] 10/08/2018, 10:06 AM

## 2018-10-08 NOTE — Plan of Care (Signed)
progressing 

## 2018-10-08 NOTE — Plan of Care (Signed)
  Problem: Education: Goal: Knowledge of General Education information will improve Description: Including pain rating scale, medication(s)/side effects and non-pharmacologic comfort measures Outcome: Progressing   Problem: Clinical Measurements: Goal: Ability to maintain clinical measurements within normal limits will improve Outcome: Progressing   Problem: Coping: Goal: Level of anxiety will decrease Outcome: Progressing

## 2018-10-08 NOTE — Progress Notes (Signed)
Occupational Therapy Treatment Patient Details Name: Breanna Webster MRN: [id number] DOB: 1946-01-26 Today's Date: 10/08/2018    History of present illness 73 y.o. female with medical history significant of TAVR 06/2018, CAD, HTN, DM2.  Patient admitted with c/o SOB, AMS and sepsis with elevated troponin due to demand ischemia   OT comments  Pt progressing towards OT goals this session, able to complete in room mobility at min guard -utilizing furniture for stability. Educated on safety and using RW if she needs more stability. Min guard/supervision for peri care followed by sink level grooming at supervision in standing. VSS throughout session, no SOB, no LOB. Pt asking about discharge - told her that was when she was medically stable and up to her MD team. OT will continue to follow acutely and HHOT is essential not only for ADL but also because Pt is responsible for IADL around her house and OT will help with energy conservation and task management.   Follow Up Recommendations  Home health OT;Supervision/Assistance - 24 hour    Equipment Recommendations  3 in 1 bedside commode    Recommendations for Other Services      Precautions / Restrictions Precautions Precautions: Fall Restrictions Weight Bearing Restrictions: No       Mobility Bed Mobility Overal bed mobility: Needs Assistance Bed Mobility: Supine to Sit;Sit to Supine     Supine to sit: Supervision(use of bed rail to assist) Sit to supine: Min guard   General bed mobility comments: HOB 20 degrees  Transfers Overall transfer level: Needs assistance Equipment used: None Transfers: Sit to/from Stand Sit to Stand: Min guard              Balance Overall balance assessment: Needs assistance Sitting-balance support: No upper extremity supported Sitting balance-Leahy Scale: Good     Standing balance support: No upper extremity supported;During functional activity Standing balance-Leahy Scale: Fair                              ADL either performed or assessed with clinical judgement   ADL Overall ADL's : Needs assistance/impaired     Grooming: Supervision/safety;Wash/dry hands;Wash/dry face;Oral care;Standing Grooming Details (indicate cue type and reason): sink level     Lower Body Bathing: Supervison/ safety;Sit to/from stand Lower Body Bathing Details (indicate cue type and reason): at sink, for peri area         Toilet Transfer: Min guard;Ambulation;Regular Glass blower/designer Details (indicate cue type and reason): furniture surfing unaware of IV pole safety Toileting- Clothing Manipulation and Hygiene: Supervision/safety;Sitting/lateral lean Toileting - Clothing Manipulation Details (indicate cue type and reason): able to get toilet paper out of holder and perform without assist/cues     Functional mobility during ADLs: Min guard       Vision       Perception     Praxis      Cognition Arousal/Alertness: Awake/alert Behavior During Therapy: WFL for tasks assessed/performed Overall Cognitive Status: Impaired/Different from baseline Area of Impairment: Safety/judgement                         Safety/Judgement: Decreased awareness of safety              Exercises     Shoulder Instructions       General Comments VSS throughout session    Pertinent Vitals/ Pain       Pain Assessment: No/denies pain  Home  Living                                          Prior Functioning/Environment              Frequency  Min 2X/week        Progress Toward Goals  OT Goals(current goals can now be found in the care plan section)  Progress towards OT goals: Progressing toward goals  Acute Rehab OT Goals Patient Stated Goal: get home with her husband OT Goal Formulation: With patient Time For Goal Achievement: 10/19/18 Potential to Achieve Goals: Good  Plan Discharge plan remains appropriate;Frequency remains  appropriate    Co-evaluation                 AM-PAC OT "6 Clicks" Daily Activity     Outcome Measure   Help from another person eating meals?: None Help from another person taking care of personal grooming?: A Little Help from another person toileting, which includes using toliet, bedpan, or urinal?: A Little Help from another person bathing (including washing, rinsing, drying)?: A Little Help from another person to put on and taking off regular upper body clothing?: A Little Help from another person to put on and taking off regular lower body clothing?: A Little 6 Click Score: 19    End of Session Equipment Utilized During Treatment: Gait belt  OT Visit Diagnosis: Unsteadiness on feet (R26.81);Other abnormalities of gait and mobility (R26.89);Muscle weakness (generalized) (M62.81);Other symptoms and signs involving cognitive function   Activity Tolerance Patient tolerated treatment well   Patient Left in bed;with call bell/phone within reach   Nurse Communication Mobility status(IV beeping -infusion complete)        Time: 1552-0802 OT Time Calculation (min): 20 min  Charges: OT General Charges $OT Visit: 1 Visit OT Treatments $Self Care/Home Management : 8-22 mins  Hulda Humphrey OTR/L Acute Rehabilitation Services Pager: [phone] Office: [place] 10/08/2018, 12:13 PM

## 2018-10-08 NOTE — Consult Note (Signed)
[address]       [place] [address]             [phone]      Cardiothoracic Surgery Consultation  Reason for Consult: Mitral valve endocarditis Referring Physician: Dr. Daneen Schick  Breanna Webster is an 73 y.o. female.  HPI:   The patient is a 73 year old woman with a history of type 2 diabetes, hypertension, coronary artery disease status post PCI with a DES to the RCA in 03/2018, and  low gradient, normal EF, severe aortic stenosis who underwent transcatheter aortic valve replacement using a 23 mm Sapien 3 valve on 07/08/2018.  She had an uncomplicated postoperative course and follow-up echocardiogram on 08/28/2018 showed a normal functioning aortic valve prosthesis with a mean gradient of 14 mmHg.  The mitral valve was degenerative in appearance with severe leaflet thickening and calcification as well as severe mitral annular calcification.  There was trivial mitral regurgitation and mild mitral valve stenosis.  The patient is now admitted on 10/03/2018 with fever and mental status changes.  She had 2/2 positive blood cultures growing Streptococcus Salivarius.  2D echocardiogram on 614 showed a mobile linear echodensity on the intra-mitral leaflet suggesting possible vegetation.  She underwent a TEE yesterday which showed a pedunculated mobile vegetation on the anterior mitral leaflet measuring about 11 mm x 18 mm.  There was mild to moderate mitral regurgitation.  There were no vegetations noted on the bioprosthetic aortic valve.  White blood cell count has gone from 16.2 on presentation to 8.6 today.  She remains afebrile.  Her procalcitonin has normalized.  She does have a history of dental implants.  She had 1 fall out prior to her transcatheter valve surgery.  She has been followed closely by a dentist in [place] as well as one in [place].  She did have an orthopantogram yesterday which showed no evidence of tooth infection.  She denies any pain in her teeth  or gums or jaw.  Past Medical History:  Diagnosis Date  . Arthritis    "back" (04/22/2018)  . CAD (coronary artery disease)    a. 03/2018 s/p PCI/DES to the RCA (3.0x15 Onyx DES).  . Carotid artery stenosis    Mild  . Chronic lower back pain   . Colon polyps   . Diverticulitis   . Diverticulosis   . Esophageal thickening    seen on pre TAVR CT scan, also questionable cirrhosis. MRI recommended. Will refer to GI  . GERD (gastroesophageal reflux disease)   . Grave's disease   . History of colonic polyps 05/22/2017  . History of hiatal hernia   . Hypertension   . Hypothyroidism   . IBS (irritable bowel syndrome)   . Osteopenia   . Pulmonary nodules    seen on pre TAVR CT. likley benign. no follow up recommended if pt low risk.  . S/P TAVR (transcatheter aortic valve replacement)   . Severe aortic stenosis   . Thalassemia minor   . Type II diabetes mellitus ([place])     Past Surgical History:  Procedure Laterality Date  . [place] STUDY N/A 03/03/2018   Procedure: [place] STUDY;  Surgeon: Mauri Pole, MD;  Location: [facility] ENDOSCOPY;  Service: Endoscopy;  Laterality: N/A;  . COLONOSCOPY W/ BIOPSIES AND POLYPECTOMY    . CORONARY ANGIOGRAPHY Right 04/21/2018   Procedure: CORONARY ANGIOGRAPHY (CATH LAB);  Surgeon: [name], MD;  Location: [facility] INVASIVE CV  LAB;  Service: Cardiovascular;  Laterality: Right;  . CORONARY STENT INTERVENTION N/A 04/22/2018   Procedure: CORONARY STENT INTERVENTION;  Surgeon: [name], MD;  Location: [place] CV LAB;  Service: Cardiovascular;  Laterality: N/A;  . DILATION AND CURETTAGE OF UTERUS    . ESOPHAGEAL MANOMETRY N/A 03/03/2018   Procedure: ESOPHAGEAL MANOMETRY (EM);  Surgeon: Mauri Pole, MD;  Location: [facility] ENDOSCOPY;  Service: Endoscopy;  Laterality: N/A;  . HERNIA REPAIR    . HYSTEROSCOPY     fibroids  . LAPAROSCOPIC CHOLECYSTECTOMY    . LAPAROSCOPY     fibroids  . NISSEN FUNDOPLICATION  [id number]  . RIGHT/LEFT  HEART CATH AND CORONARY ANGIOGRAPHY N/A 02/20/2018   Procedure: RIGHT/LEFT HEART CATH AND CORONARY ANGIOGRAPHY;  Surgeon: [name], MD;  Location: [place] CV LAB;  Service: Cardiovascular;  Laterality: N/A;  . TEE WITHOUT CARDIOVERSION N/A 07/08/2018   Procedure: TRANSESOPHAGEAL ECHOCARDIOGRAM (TEE);  Surgeon: Burnell Blanks, MD;  Location: [place];  Service: Open Heart Surgery;  Laterality: N/A;  . TEE WITHOUT CARDIOVERSION  10/07/2018  . TEE WITHOUT CARDIOVERSION N/A 10/07/2018   Procedure: TRANSESOPHAGEAL ECHOCARDIOGRAM (TEE);  Surgeon: Jerline Pain, MD;  Location[facility] ENDOSCOPY;  Service: Cardiovascular;  Laterality: N/A;  . TONSILLECTOMY    . TRANSCATHETER AORTIC VALVE REPLACEMENT, TRANSFEMORAL N/A 07/08/2018   Procedure: TRANSCATHETER AORTIC VALVE REPLACEMENT, TRANSFEMORAL;  Surgeon: Burnell Blanks, MD;  Location: [place];  Service: Open Heart Surgery;  Laterality: N/A;    Family History  Adopted: Yes  Problem Relation Age of Onset  . Healthy Son        x 2  . Headache Other        Cluster headaches  . Heart failure Mother   . Colon cancer Neg Hx   . Pancreatic cancer Neg Hx   . Rectal cancer Neg Hx   . Stomach cancer Neg Hx     Social History:  reports that she has never smoked. She has never used smokeless tobacco. She reports that she does not drink alcohol or use drugs.  Allergies:  Allergies  Allergen Reactions  . Statins Other (See Comments)    Muscle aches    Medications:  I have reviewed the patient's current medications. Prior to Admission:  Medications Prior to Admission  Medication Sig Dispense Refill Last Dose  . acetaminophen (TYLENOL) 500 MG tablet Take 1,000 mg by mouth every 6 (six) hours as needed (for pain.).   unk  . aspirin 81 MG tablet Take 81 mg by mouth daily.     10/03/2018 at Unknown time  . bisoprolol (ZEBETA) 5 MG tablet TAKE 1 TABLET BY MOUTH EVERY DAY 90 tablet 0 10/03/2018 at 0900  . chlorpheniramine (CHLOR-TRIMETON) 4  MG tablet Take 4 mg by mouth at bedtime.    10/01/2018  . cholecalciferol (VITAMIN D) 1000 units tablet Take 1,000 Units by mouth daily.   10/02/2018  . clopidogrel (PLAVIX) 75 MG tablet Take 1 tablet (75 mg total) by mouth daily with breakfast. 30 tablet 6 10/03/2018 at 0900  . escitalopram (LEXAPRO) 20 MG tablet TAKE 1 TABLET BY MOUTH EVERY DAY 90 tablet 0 10/03/2018 at Unknown time  . hydrochlorothiazide (HYDRODIURIL) 12.5 MG tablet TAKE 1 TABLET BY MOUTH EVERY DAY 90 tablet 0 10/03/2018 at Unknown time  . insulin NPH Human (NOVOLIN N RELION) 100 UNIT/ML injection Inject 1.2 mLs (120 Units total) into the skin every morning. And syringes 1/day 40 mL 11 10/03/2018 at Unknown time  .  losartan (COZAAR) 50 MG tablet TAKE 1 TABLET(50 MG) BY MOUTH DAILY (Patient taking differently: Take 50 mg by mouth daily. ) 90 tablet 0 10/03/2018 at Unknown time  . Multiple Vitamin (MULITIVITAMIN WITH MINERALS) TABS Take 1 tablet by mouth daily.   Past Week at Unknown time  . nitroGLYCERIN (NITROSTAT) 0.4 MG SL tablet Place 1 tablet (0.4 mg total) under the tongue every 5 (five) minutes as needed for chest pain. 25 tablet 3 unk  . nortriptyline (PAMELOR) 25 MG capsule TAKE 1 CAPSULE(25 MG) BY MOUTH AT BEDTIME (Patient taking differently: Take 25 mg by mouth at bedtime. ) 90 capsule 0 10/02/2018  . pantoprazole (PROTONIX) 40 MG tablet Take 1 tablet (40 mg total) by mouth 2 (two) times daily. 60 tablet 5 10/03/2018 at Unknown time  . SYNTHROID 175 MCG tablet TAKE 1 TABLET BY MOUTH EVERY DAY 90 tablet 0 10/03/2018 at Unknown time  . Insulin Syringe-Needle U-100 (BD INSULIN SYRINGE U/F) 31G X 5/16" 1 ML MISC USE TO INJECT INSULIN ONCE DAILY 90 each 2   . ONETOUCH DELICA LANCETS 29N MISC USE TO CHECK BLOOD SUGAR TWICE DAILY (Patient taking differently: 2 (two) times a day. ) 200 each 0   . ONETOUCH VERIO test strip USE TO CHECK BLOOD SUGAR TWICE DAILY (Patient taking differently: 1 each by Other route 2 (two) times a day. ) 200 each  0    Scheduled: . aspirin  81 mg Oral Daily  . bisoprolol  5 mg Oral Daily  . clopidogrel  75 mg Oral Q breakfast  . enoxaparin (LOVENOX) injection  40 mg Subcutaneous Q24H  . escitalopram  20 mg Oral Daily  . ferrous sulfate  325 mg Oral BID WC  . guaiFENesin  600 mg Oral BID  . insulin aspart  0-15 Units Subcutaneous TID WC  . insulin aspart  0-5 Units Subcutaneous QHS  . insulin aspart  6 Units Subcutaneous TID WC  . insulin glargine  40 Units Subcutaneous Daily  . levothyroxine  175 mcg Oral Q0600  . multivitamin with minerals  1 tablet Oral Daily  . nortriptyline  25 mg Oral QHS  . pantoprazole  40 mg Oral BID  . sodium chloride flush  3 mL Intravenous Once   Continuous: . cefTRIAXone (ROCEPHIN)  IV 2 g (10/07/18 2329)  . gentamicin 80 mg (10/08/18 1008)  . sodium chloride 500 mL (10/08/18 1112)   LGX:[id number], ipratropium-albuterol Anti-infectives (From admission, onward)   Start     Dose/Rate Route Frequency Ordered Stop   10/07/18 2000  gentamicin (GARAMYCIN) IVPB 80 mg     80 mg 100 mL/hr over 30 Minutes Intravenous Every 12 hours 10/07/18 1548     10/05/18 1000  doxycycline (VIBRA-TABS) tablet 100 mg  Status:  Discontinued     100 mg Oral Every 12 hours 10/05/18 0813 10/05/18 1300   10/04/18 2230  cefTRIAXone (ROCEPHIN) 2 g in sodium chloride 0.9 % 100 mL IVPB     2 g 200 mL/hr over 30 Minutes Intravenous Every 24 hours 10/04/18 2214     10/04/18 1415  doxycycline (VIBRA-TABS) tablet 100 mg  Status:  Discontinued     100 mg Oral Every 12 hours 10/04/18 1337 10/04/18 2216   10/04/18 1200  vancomycin (VANCOCIN) IVPB 1000 mg/200 mL premix  Status:  Discontinued     1,000 mg 200 mL/hr over 60 Minutes Intravenous Every 12 hours 10/03/18 2341 10/04/18 2215   10/04/18 1000  ceFEPIme (MAXIPIME) 2 g in sodium chloride  0.9 % 100 mL IVPB  Status:  Discontinued     2 g 200 mL/hr over 30 Minutes Intravenous Every 12 hours 10/03/18 2341 10/04/18 0320   10/04/18 0600   piperacillin-tazobactam (ZOSYN) IVPB 3.375 g  Status:  Discontinued     3.375 g 12.5 mL/hr over 240 Minutes Intravenous Every 8 hours 10/04/18 0323 10/04/18 2215   10/04/18 0400  acyclovir (ZOVIRAX) 600 mg in dextrose 5 % 100 mL IVPB  Status:  Discontinued     600 mg 112 mL/hr over 60 Minutes Intravenous Every 8 hours 10/04/18 0323 10/04/18 1337   10/03/18 2245  ceFEPIme (MAXIPIME) 2 g in sodium chloride 0.9 % 100 mL IVPB     2 g 200 mL/hr over 30 Minutes Intravenous  Once 10/03/18 2231 10/04/18 0252   10/03/18 2245  metroNIDAZOLE (FLAGYL) IVPB 500 mg     500 mg 100 mL/hr over 60 Minutes Intravenous  Once 10/03/18 2231 10/04/18 0136   10/03/18 2245  vancomycin (VANCOCIN) IVPB 1000 mg/200 mL premix  Status:  Discontinued     1,000 mg 200 mL/hr over 60 Minutes Intravenous  Once 10/03/18 2231 10/03/18 2235   10/03/18 2245  vancomycin (VANCOCIN) 2,000 mg in sodium chloride 0.9 % 500 mL IVPB     2,000 mg 250 mL/hr over 120 Minutes Intravenous  Once 10/03/18 2235 10/04/18 0354      Results for orders placed or performed during the hospital encounter of 10/03/18 (from the past 48 hour(s))  Glucose, capillary     Status: Abnormal   Collection Time: 10/06/18  4:34 PM  Result Value Ref Range   Glucose-Capillary 144 (H) 70 - 99 mg/dL  Glucose, capillary     Status: Abnormal   Collection Time: 10/06/18  8:30 PM  Result Value Ref Range   Glucose-Capillary 118 (H) 70 - 99 mg/dL  CBC     Status: Abnormal   Collection Time: 10/07/18  4:12 AM  Result Value Ref Range   WBC 6.8 4.0 - 10.5 K/uL   RBC 4.66 3.87 - 5.11 MIL/uL   Hemoglobin 8.8 (L) 12.0 - 15.0 g/dL    Comment: Reticulocyte Hemoglobin testing may be clinically indicated, consider ordering this additional test [id number]    HCT 28.6 (L) 36.0 - 46.0 %   MCV 61.4 (L) 80.0 - 100.0 fL   MCH 18.9 (L) 26.0 - 34.0 pg   MCHC 30.8 30.0 - 36.0 g/dL   RDW 15.9 (H) 11.5 - 15.5 %   Platelets 177 150 - 400 K/uL    Comment: REPEATED TO VERIFY    nRBC 0.3 (H) 0.0 - 0.2 %    Comment: Performed at [facility], [address] [place], [place] [address]  Magnesium     Status: None   Collection Time: 10/07/18  4:12 AM  Result Value Ref Range   Magnesium 1.7 1.7 - 2.4 mg/dL    Comment: Performed at [facility] [address], [place], [place] [address]  Procalcitonin - Baseline     Status: None   Collection Time: 10/07/18  4:12 AM  Result Value Ref Range   Procalcitonin 1.00 ng/mL    Comment:        Interpretation: PCT > 0.5 ng/mL and <= 2 ng/mL: Systemic infection (sepsis) is possible, but other conditions are known to elevate PCT as well. (NOTE)       Sepsis PCT Algorithm           Lower Respiratory Tract  Infection PCT Algorithm    ----------------------------     ----------------------------         PCT < 0.25 ng/mL                PCT < 0.10 ng/mL         Strongly encourage             Strongly discourage   discontinuation of antibiotics    initiation of antibiotics    ----------------------------     -----------------------------       PCT 0.25 - 0.50 ng/mL            PCT 0.10 - 0.25 ng/mL               OR       >80% decrease in PCT            Discourage initiation of                                            antibiotics      Encourage discontinuation           of antibiotics    ----------------------------     -----------------------------         PCT >= 0.50 ng/mL              PCT 0.26 - 0.50 ng/mL                AND       <80% decrease in PCT             Encourage initiation of                                             antibiotics       Encourage continuation           of antibiotics    ----------------------------     -----------------------------        PCT >= 0.50 ng/mL                  PCT > 0.50 ng/mL               AND         increase in PCT                  Strongly encourage                                      initiation of antibiotics    Strongly  encourage escalation           of antibiotics                                     -----------------------------                                           PCT <= 0.25 ng/mL  OR                                        > 80% decrease in PCT                                     Discontinue / Do not initiate                                             antibiotics Performed at [facility], [place] [address], [place], [place] [address]   Glucose, capillary     Status: Abnormal   Collection Time: 10/07/18  6:26 AM  Result Value Ref Range   Glucose-Capillary 114 (H) 70 - 99 mg/dL  Glucose, capillary     Status: Abnormal   Collection Time: 10/07/18 11:31 AM  Result Value Ref Range   Glucose-Capillary 120 (H) 70 - 99 mg/dL  Glucose, capillary     Status: Abnormal   Collection Time: 10/07/18  4:21 PM  Result Value Ref Range   Glucose-Capillary 213 (H) 70 - 99 mg/dL  Glucose, capillary     Status: Abnormal   Collection Time: 10/07/18  9:05 PM  Result Value Ref Range   Glucose-Capillary 157 (H) 70 - 99 mg/dL  CBC     Status: Abnormal   Collection Time: 10/08/18  5:24 AM  Result Value Ref Range   WBC 8.6 4.0 - 10.5 K/uL   RBC 4.78 3.87 - 5.11 MIL/uL   Hemoglobin 9.1 (L) 12.0 - 15.0 g/dL   HCT 29.8 (L) 36.0 - 46.0 %   MCV 62.3 (L) 80.0 - 100.0 fL   MCH 19.0 (L) 26.0 - 34.0 pg   MCHC 30.5 30.0 - 36.0 g/dL   RDW 16.7 (H) 11.5 - 15.5 %   Platelets 198 150 - 400 K/uL    Comment: REPEATED TO VERIFY   nRBC 0.5 (H) 0.0 - 0.2 %    Comment: Performed at [facility], [place]. [address] [place], [place] [address]  Magnesium     Status: None   Collection Time: 10/08/18  5:24 AM  Result Value Ref Range   Magnesium 1.8 1.7 - 2.4 mg/dL    Comment: Performed at [facility] [address], [place], [place] [address]  Procalcitonin - Baseline     Status: None   Collection Time: 10/08/18  5:24 AM  Result Value Ref Range   Procalcitonin 0.55  ng/mL    Comment:        Interpretation: PCT > 0.5 ng/mL and <= 2 ng/mL: Systemic infection (sepsis) is possible, but other conditions are known to elevate PCT as well. (NOTE)       Sepsis PCT Algorithm           Lower Respiratory Tract                                      Infection PCT Algorithm    ----------------------------     ----------------------------         PCT < 0.25 ng/mL  PCT < 0.10 ng/mL         Strongly encourage             Strongly discourage   discontinuation of antibiotics    initiation of antibiotics    ----------------------------     -----------------------------       PCT 0.25 - 0.50 ng/mL            PCT 0.10 - 0.25 ng/mL               OR       >80% decrease in PCT            Discourage initiation of                                            antibiotics      Encourage discontinuation           of antibiotics    ----------------------------     -----------------------------         PCT >= 0.50 ng/mL              PCT 0.26 - 0.50 ng/mL                AND       <80% decrease in PCT             Encourage initiation of                                             antibiotics       Encourage continuation           of antibiotics    ----------------------------     -----------------------------        PCT >= 0.50 ng/mL                  PCT > 0.50 ng/mL               AND         increase in PCT                  Strongly encourage                                      initiation of antibiotics    Strongly encourage escalation           of antibiotics                                     -----------------------------                                           PCT <= 0.25 ng/mL                                                 OR                                        >  80% decrease in PCT                                     Discontinue / Do not initiate                                             antibiotics Performed at [facility], [place]  [address], [place], [place] [address]   Glucose, capillary     Status: None   Collection Time: 10/08/18  6:30 AM  Result Value Ref Range   Glucose-Capillary 95 70 - 99 mg/dL  Glucose, capillary     Status: Abnormal   Collection Time: 10/08/18 11:10 AM  Result Value Ref Range   Glucose-Capillary 160 (H) 70 - 99 mg/dL   Comment 1 Notify RN     Dg Orthopantogram  Result Date: 10/07/2018 CLINICAL DATA:  Mitral valve endocarditis. EXAM: ORTHOPANTOGRAM/PANORAMIC COMPARISON:  None. FINDINGS: Multiple dental implants and amalgam are noted. No dental caries or periapical lucencies. IMPRESSION: No evidence of tooth infection. Electronically Signed   By: Titus Dubin M.D.   On: 10/07/2018 19:43    Review of Systems  Constitutional: Positive for chills, fever and malaise/fatigue. Negative for weight loss.  HENT:       1 dental implant that fell out when she is planning on having replaced by her dentist in [place]: Negative.   Respiratory: Positive for shortness of breath.   Cardiovascular: Negative for chest pain, leg swelling and PND.  Gastrointestinal: Negative.   Genitourinary: Negative.   Musculoskeletal: Negative.   Skin: Negative.   Neurological: Negative for dizziness, focal weakness, seizures, loss of consciousness and headaches.  Endo/Heme/Allergies: Negative.   Psychiatric/Behavioral: Negative.    Blood pressure 127/62, pulse (!) 58, temperature 97.8 F (36.6 C), temperature source Oral, resp. rate 20, height 5' 5"  (1.651 m), weight 92.4 kg, SpO2 95 %. Physical Exam  Constitutional: She is oriented to person, place, and time.  Morbidly obese woman in no distress  HENT:  Head: Normocephalic and atraumatic.  Mouth/Throat: Oropharynx is clear and moist.  Eyes: Pupils are equal, round, and reactive to light. Conjunctivae and EOM are normal.  Neck: Normal range of motion. Neck supple. No JVD present. No thyromegaly present.  Cardiovascular: Normal rate and regular rhythm.   Murmur heard. 2/6 systolic murmur along the left sternal border.  No diastolic murmur  Respiratory: Effort normal and breath sounds normal. No respiratory distress.  GI: Soft. Bowel sounds are normal. She exhibits no distension. There is no abdominal tenderness.  Musculoskeletal: Normal range of motion.        General: No edema.  Lymphadenopathy:    She has no cervical adenopathy.  Neurological: She is alert and oriented to person, place, and time.  Skin: Skin is warm and dry.  Psychiatric: She has a normal mood and affect.   TRANSESOPHOGEAL ECHO REPORT       Patient Name:   Breanna [place] Date of Exam: 10/07/2018 Medical Rec #:  [id number]        Height:       65.0 in Accession #:    [id number]       Weight:       203.7 lb Date of Birth:  December 26, 1945  BSA:          1.99 m Patient Age:    40 years         BP:           149/45 mmHg Patient Gender: F                HR:           70 bpm. Exam Location:  Inpatient    Procedure: Transesophageal Echo  Indications:     Bacteremia 790.7 / R78.81   History:         Patient has prior history of Echocardiogram examinations, most                  recent 10/05/2018. CAD LBBB Severe aortic stenosis Risk Factors:                  Hypertension, Diabetes, Dyslipidemia and Pulmonary                  hypertension. Aortic Valve: A 94m Edwards Sapien                  bioprosthetic, stented aortic valve (TAVR) Procedure Date:                  07/08/2018 S/P TAVR                  Sepsis.   Sonographer:     CTalmage CoinReferring Phys:  [id number] CommentB ROBERTS Diagnosing Phys: MCandee FurbishMD     PROCEDURE: The transesophogeal probe was passed through the esophogus of the patient. The patient developed no complications during the procedure.  IMPRESSIONS    1. The right ventricle has normal systolc function. There is no increase in right ventricular wall thickness.  2. Left atrial size was mildly dilated.  3. No evidence of a  thrombus present in the left atrial appendage.  4. Mildly thickened tricuspid valve leaflets.  5. The mitral valve is degenerative. Mitral valve regurgitation is mild to moderate by color flow Doppler. A moderate pedunculated and mobile vegetation is seen on the anterior mitral leaflet. The vegetation measures 11 mm by 18 mm.  6. 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 Tricuspid valve regurgitation is mild-moderate.  7. A 239mEdwards Sapien bioprosthetic aortic valve (TAVR) valve is present in the aortic position. Procedure Date: 07/08/2018 Normal aortic valve prosthesis.  8. The left ventricle has normal systolic function, with an ejection fraction of 60-65%. No evidence of left ventricular regional wall motion abnormalities.  SUMMARY   Mitral valve endocarditis  FINDINGS  Left Ventricle: The left ventricle has normal systolic function, with an ejection fraction of 60-65%. No evidence of left ventricular regional wall motion abnormalities.  Right Ventricle: The right ventricle has normal systolic function. There is no increase in right ventricular wall thickness.  Left Atrium: Left atrial size was mildly dilated.   Left Atrial Appendage: No evidence of a thrombus present in the left atrial appendage.  Right Atrium: Right atrial size was normal in size. Right atrial pressure is estimated at 10 mmHg.  Interatrial Septum: No atrial level shunt detected by color flow Doppler.  Pericardium: There is no evidence of pericardial effusion.  Mitral Valve: The mitral valve is degenerative in appearance. Mitral valve regurgitation is mild to moderate by color flow Doppler. A moderate pedunculated and mobile vegetation is seen on the anterior mitral leaflet. The vegetation measures 11 mm by 18  mm. There  is.  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 Tricuspid valve regurgitation is mild-moderate by color flow Doppler. The tricuspid valve is mildly thickened.  Aortic Valve: The  aortic valve has been repaired/replaced Aortic valve regurgitation was not visualized by color flow Doppler. There is no evidence of aortic valve stenosis. A 48m Edwards Sapien bioprosthetic, stented aortic valve (TAVR) valve is  present in the aortic position. Procedure Date: 07/08/2018 Normal aortic valve prosthesis.  Pulmonic Valve: The pulmonic valve was normal in structure. Pulmonic valve regurgitation is not visualized by color flow Doppler.    +-------------+------------++ AORTIC VALVE              +-------------+------------++ AV Vmax:     187.00 cm/s  +-------------+------------++ AV Vmean:    119.000 cm/s +-------------+------------++ AV VTI:      0.233 m      +-------------+------------++ AV Peak Grad:14.0 mmHg    +-------------+------------++ AV Mean Grad:7.0 mmHg     +-------------+------------++    MCandee FurbishMD Electronically signed by MCandee FurbishMD Signature Date/Time: 10/07/2018/1:31:10 PM    Assessment/Plan:   This 73year old woman has mitral valve endocarditis with a significant vegetation on the anterior mitral leaflet and mild to moderate mitral regurgitation.  There is no abnormality of the prosthetic aortic valve.  She is clinically much better after intravenous antibiotic therapy with resolution of her fever and leukocytosis.  Her vegetation is probably about 1.1 cm.  The 18 mm measurement is of a filamentous projection from the vegetation which does not really estimate the true size of the vegetation.  I do not think there is any indication for surgical treatment at this time and she should complete a course of intravenous antibiotics as long as she is doing well.  She will require reevaluation of her valves with echocardiogram following completion of antibiotic therapy.  I would have to think that the source of her bacteremia was oropharyngeal although her orthopantogram shows no evidence of dental infection.  Her mitral valve had  significant degenerative disease which would put it at increased risk for endocarditis with bacteremia.  I discussed the need for continued antibiotic therapy with her and answered as many of her questions as possible.  I will be happy to see her again if the need arises during her course of antibiotic treatment.  I spent 40 minutes performing this consultation and > 50% of this time was spent face to face counseling and coordinating the care of this patient's mitral valve endocarditis.  BGaye Pollack6/17/2020, 4:22 PM

## 2018-10-09 ENCOUNTER — Telehealth: Payer: Self-pay

## 2018-10-09 ENCOUNTER — Inpatient Hospital Stay: Payer: Self-pay

## 2018-10-09 ENCOUNTER — Telehealth: Payer: Self-pay | Admitting: Interventional Cardiology

## 2018-10-09 DIAGNOSIS — B955 Unspecified streptococcus as the cause of diseases classified elsewhere: Secondary | ICD-10-CM

## 2018-10-09 DIAGNOSIS — I341 Nonrheumatic mitral (valve) prolapse: Secondary | ICD-10-CM

## 2018-10-09 LAB — CBC
HCT: 31.9 % — ABNORMAL LOW (ref 36.0–46.0)
Hemoglobin: 9.8 g/dL — ABNORMAL LOW (ref 12.0–15.0)
MCH: 19.1 pg — ABNORMAL LOW (ref 26.0–34.0)
MCHC: 30.7 g/dL (ref 30.0–36.0)
MCV: 62.2 fL — ABNORMAL LOW (ref 80.0–100.0)
Platelets: 202 10*3/uL (ref 150–400)
RBC: 5.13 MIL/uL — ABNORMAL HIGH (ref 3.87–5.11)
RDW: 16.6 % — ABNORMAL HIGH (ref 11.5–15.5)
WBC: 9.1 10*3/uL (ref 4.0–10.5)
nRBC: 1 % — ABNORMAL HIGH (ref 0.0–0.2)

## 2018-10-09 LAB — BASIC METABOLIC PANEL
Anion gap: 9 (ref 5–15)
BUN: 6 mg/dL — ABNORMAL LOW (ref 8–23)
CO2: 23 mmol/L (ref 22–32)
Calcium: 9.6 mg/dL (ref 8.9–10.3)
Chloride: 107 mmol/L (ref 98–111)
Creatinine, Ser: 0.64 mg/dL (ref 0.44–1.00)
GFR calc Af Amer: >60 mL/min (ref >60)
GFR calc non Af Amer: >60 mL/min (ref >60)
Glucose, Bld: 91 mg/dL (ref 70–99)
Potassium: 4.1 mmol/L (ref 3.5–5.1)
Sodium: 139 mmol/L (ref 135–145)

## 2018-10-09 LAB — GLUCOSE, CAPILLARY
Glucose-Capillary: 85 mg/dL (ref 70–99)
Glucose-Capillary: 96 mg/dL (ref 70–99)
Glucose-Capillary: 181 mg/dL — ABNORMAL HIGH (ref 70–99)
Glucose-Capillary: 150 mg/dL — ABNORMAL HIGH (ref 70–99)
Glucose-Capillary: 147 mg/dL — ABNORMAL HIGH (ref 70–99)

## 2018-10-09 LAB — GENTAMICIN LEVEL, TROUGH: Gentamicin Trough: 1.4 ug/mL (ref 0.5–2.0)

## 2018-10-09 LAB — PROCALCITONIN: Procalcitonin: 0.43 ng/mL

## 2018-10-09 LAB — MAGNESIUM: Magnesium: 1.7 mg/dL (ref 1.7–2.4)

## 2018-10-09 MED ORDER — GENTAMICIN IV (FOR PTA / DISCHARGE USE ONLY): 80.0000 mg | INTRAVENOUS | 0 refills | Status: DC

## 2018-10-09 MED ORDER — LOSARTAN POTASSIUM 50 MG PO TABS
50.0000 mg | ORAL_TABLET | Freq: Every day | ORAL | Status: DC
  Administered 2018-10-09 – 2018-10-10 (×2): 50 mg via ORAL
  Filled 2018-10-09 (×2): qty 1

## 2018-10-09 MED ORDER — GENTAMICIN IN SALINE 1.6-0.9 MG/ML-% IV SOLN
80.0000 mg | INTRAVENOUS | Status: DC
  Administered 2018-10-10: 80 mg via INTRAVENOUS
  Filled 2018-10-09: qty 50

## 2018-10-09 MED ORDER — HYDROCHLOROTHIAZIDE 25 MG PO TABS
12.5000 mg | ORAL_TABLET | Freq: Every day | ORAL | Status: DC
  Administered 2018-10-09 – 2018-10-10 (×2): 12.5 mg via ORAL
  Filled 2018-10-09 (×2): qty 1

## 2018-10-09 MED ORDER — INSULIN NPH (HUMAN) (ISOPHANE) 100 UNIT/ML ~~LOC~~ SUSP: 40.0000 [IU] | SUBCUTANEOUS | 0 refills | Status: DC

## 2018-10-09 MED ORDER — CEFTRIAXONE IV (FOR PTA / DISCHARGE USE ONLY): 2.0000 g | INTRAVENOUS | 0 refills | Status: DC

## 2018-10-09 MED ORDER — SODIUM CHLORIDE 0.9% FLUSH: 10.0000 mL | INTRAVENOUS | Status: DC | PRN

## 2018-10-09 MED ORDER — FERROUS SULFATE 325 (65 FE) MG PO TABS: 325.0000 mg | ORAL_TABLET | Freq: Two times a day (BID) | ORAL | 0 refills | Status: DC

## 2018-10-09 MED ORDER — SODIUM CHLORIDE 0.9% FLUSH
10.0000 mL | Freq: Two times a day (BID) | INTRAVENOUS | Status: DC
  Administered 2018-10-10 (×2): 10 mL

## 2018-10-09 MED ORDER — SODIUM CHLORIDE 0.9 % IV BOLUS: 1000.0000 mL | Freq: Every day | INTRAVENOUS | 0 refills | Status: DC

## 2018-10-09 NOTE — Telephone Encounter (Signed)
To Whom it May Concern,  Kaytlynne Neace. Benscoter (09-21-1945) and [name] Sloop(10/29/1941) will be unable to fly for 2 months due to the sudden severe illness of Mrs. Swann. Her recuperation will take at least 8 weeks from illness onset. 10/04/2018. Mr. Arntz will be responsible for her care at home until recovery is complete.  Sincerely yours,   Illene Labrador, III, MD

## 2018-10-09 NOTE — Progress Notes (Signed)
Spoke with Robin from IV team. She said that PICC line could not be done this evening for patient to be discharged home for home antibiotics. Plan to do it tomorrow. MD notified via Am[employer] system.  Emelda Fear, RN

## 2018-10-09 NOTE — TOC Transition Note (Addendum)
Transition of Care [facility]) - CM/SW Discharge Note Marvetta Gibbons RN, BSN Transitions of Care Unit 4E- RN Case Manager [phone]   Patient Details  Name: Breanna Webster MRN: [id number] Date of Birth: 1945-10-11  Transition of Care [name]) CM/SW Contact:  Dawayne Patricia, RN Phone Number: 10/09/2018, 2:49 PM   Clinical Narrative:    Pt admitted with bacteremia, will need LT IV abx, Pam with [employer] received referral from ID for abx needs- Cm spoke with pt at bedside for [facility] needs- list provided Per CMS guidelines from medicare.gov website with star ratings (copy placed in shadow chart), per pt she would like to use [facility] for HHRN/PT/OT- in coordination with [facility] for pharmacy needs. Pt would also like 3n1 for home as she loaned hers out and unable to get it back. CM to check with [place] and see if pt can get another 3n1 under pt's Medicare- call made to [facility] with [employer]. Call also made to [facility] with [facility] for [facility] referral- referral has been accepted. Pam with [employer] to come provide education at the bedside for home IV abx. Pt to transition home once PICC line placed.    Final next level of care: [facility] Barriers to Discharge: No Barriers Identified   Patient Goals and CMS Choice Patient states their goals for this hospitalization and ongoing recovery are:: "to get home" CMS Medicare.gov Compare Post Acute Care list provided to:: Patient Choice offered to / list presented to : Patient  Discharge Placement  Home with [facility] : [facility]                     Discharge Plan and Services     Post Acute Care Choice: Durable Medical Equipment, Home Health          DME Arranged: 3-N-1 DME Agency: [employer] Date DME Agency Contacted: 10/09/18 Time DME Agency Contacted: 1220 Representative spoke with at DME Agency: Andree Coss HH Arranged: RN, PT, OT [facility] Agency: [place] ([place]) Date [place]: 10/09/18 Time [place]:  1215 Representative spoke with at [place]: [place] (SDOH) Interventions     Readmission Risk Interventions Readmission Risk Prevention Plan 10/09/2018  Transportation Screening Complete  PCP or Specialist Appt within 5-7 Days Complete  Home Care Screening Complete  Medication Review (RN CM) Complete  Some recent data might be hidden

## 2018-10-09 NOTE — Progress Notes (Signed)
Peripherally Inserted Central Catheter/Midline Placement  The IV Nurse has discussed with the patient and/or persons authorized to consent for the patient, the purpose of this procedure and the potential benefits and risks involved with this procedure.  The benefits include less needle sticks, lab draws from the catheter, and the patient may be discharged home with the catheter. Risks include, but not limited to, infection, bleeding, blood clot (thrombus formation), and puncture of an artery; nerve damage and irregular heartbeat and possibility to perform a PICC exchange if needed/ordered by physician.  Alternatives to this procedure were also discussed.  Bard Power PICC patient education guide, fact sheet on infection prevention and patient information card has been provided to patient /or left at bedside.    PICC/Midline Placement Documentation  PICC Double Lumen 10/09/18 PICC Left Brachial 41 cm 1 cm (Active)  Indication for Insertion or Continuance of Line Home intravenous therapies (PICC only) 10/09/18 2207  Exposed Catheter (cm) 1 cm 10/09/18 2207  Site Assessment Clean;Dry;Intact 10/09/18 2207  Lumen #1 Status Blood return noted;Flushed;Saline locked 10/09/18 2207  Lumen #2 Status Blood return noted;Flushed;Saline locked 10/09/18 2207  Dressing Type Transparent;Occlusive;Securing device 10/09/18 2207  Dressing Status Clean;Dry;Intact;Antimicrobial disc in place 10/09/18 2207  Line Adjustment (NICU/IV Team Only) No 10/09/18 2207  Dressing Intervention New dressing 10/09/18 2207  Dressing Change Due 10/16/18 10/09/18 2207       Edson Snowball 10/09/2018, 10:28 PM

## 2018-10-09 NOTE — Plan of Care (Signed)
progressing 

## 2018-10-09 NOTE — Telephone Encounter (Signed)
New Message     Pts husband called and is needing a written note for why Breanna Webster cannot fly for the next 6 weeks.  He says he has a letter he can email or fax to our  Office    Please call

## 2018-10-09 NOTE — Progress Notes (Signed)
RN aware PICC will not be placed until tomorrow. Discharge orders written at 1430, unaware until after 1800. Will continue to monitor.

## 2018-10-09 NOTE — Discharge Summary (Addendum)
Discharge Summary  Breanna Webster GDJ:[id number] DOB: 04/19/1946  PCP: [name], NP  Admit date: 10/03/2018 Discharge date: 10/09/2018  Time spent: 35 minutes  Recommendations for Outpatient Follow-up:  1. Follow up with Infectious Disease 2. Follow up with your cardiologist 3. Follow-up with your PCP 4. Take your medications as prescribed 5. Continue physical therapy.  Recommendations per infectious disease: Discharge antibiotics: Per pharmacy protocol Gentamicin x 11 more days with goal trough < 1 Ceftriaxone 2 gm IV Q24h    Duration: Gentamicin - 2 weeks total  Ceftriaxone - 6 weeks total   End Date: Gentamicin - End date 10/21/2018 Ceftriaxone - End date 11/18/18   [facility] Care and Maintenance Per Protocol __ Please pull PIC at completion of IV antibiotics _x_ Please leave PIC in place until doctor has seen patient or been notified  Labs weekly while on IV antibiotics: _x_ CBC with differential __ BMP _x_ BMP TWICE WEEKLY** __ CMP __ CRP __ ESR _x_ Gentamicin troughs  Fax weekly labs to [phone]  Clinic Follow Up Appt: Dr. Baxter Flattery July 1 @ 11:30 am    "Medication Monitoring - will need close kidney function monitoring while on Gentamicin therapy. Requested to delay any travel to [place] while on gentamicin therapy due to extreme concern over toxicity. She seems to understand this and acknowledge she will not travel" per ID.     Discharge Diagnoses:  Active Hospital Problems   Diagnosis Date Noted   Bacteremia due to Streptococcus Salivarius 10/07/2018   Endocarditis of mitral valve 10/07/2018   Sepsis ([place]) 11/94/1740   Acute metabolic encephalopathy 81/44/8185   S/P TAVR (transcatheter aortic valve replacement) 07/08/2018   CAD in native artery 04/22/2018   Pulmonary hypertension ([place]) 02/21/2018   Essential hypertension 07/15/2017   Diabetes mellitus due to underlying condition, uncontrolled ([place]) 06/30/2010    Resolved  Hospital Problems  No resolved problems to display.    Discharge Condition: Stable   Diet recommendation: Resume previous diet   Vitals:   10/09/18 0801 10/09/18 1201  BP: (!) 154/71 (!) 148/68  Pulse: (!) 54   Resp:    Temp: 97.6 F (36.4 C) 97.8 F (36.6 C)  SpO2: 94%     History of present illness:  73 year old female with history of AS s/p TAVR in 3/20, CAD s/p stent to RCA in 03/2018, HTN, PHTN, DM-2, hypothyroidism, LBBB and GERD admitted with acute shortness of breath, fever and altered mental status. Reportedly oriented to person and place only on admission. In the ED, hemodynamically stable except for RR to 31. Oxygen saturation 93% on 2 L. Leukocytosis to 16,000. Lactate of 4.1. Elevated to 0.98. CXR, CT head, CTA chest/abdomen/pelvis without acute significant finding. COVID-19 negative. Urinalysis negative. Received 4 L of IV fluid bolus and lactate trended down to 2.4. Blood cultures drawn. Started on broad-spectrum antibiotics including acyclovir. Cardiology consulted. Patient was admitted for undifferentiated sepsis. Blood culture grew GPC/Streptococcus species in 3 out of 4. Antibiotic de-escalated to ceftriaxone . Doxycycline discontinued.  TEE 6/1 with evidence of infective endocarditis, no obvious TAVR valve infection..  On IV gentamicin and Rocephin for strep salvarium bacteremia per ID.  10/09/18: Patient seen and examined at her bedside.  No acute events overnight.  She has no new concerns.  She denies chest pain, palpitations or shortness of breath.  On the day of discharge, the patient was hemodynamically stable.  She will need to follow-up with infectious disease, her cardiologist, posthospitalization.  She will also need to continue  physical therapy and avoid falls.   Hospital Course:  Principal Problem:   Bacteremia due to Streptococcus Salivarius Active Problems:   Diabetes mellitus due to underlying condition, uncontrolled ([place])    Essential hypertension   Pulmonary hypertension (HCC)   CAD in native artery   S/P TAVR (transcatheter aortic valve replacement)   Sepsis (H[facility])   Acute metabolic encephalopathy   Endocarditis of mitral valve  Strep salivarus bacteremia with infective endocarditis Blood culture on 6/12, positive for Streptococcus salivarius Underwent TEE 6/16 that shows IE butno obvious TAVR valve infection. Infectious disease followed Continue gentamicin and Rocephin as recommended by infectious disease.   Repeat blood cultures x2 done on 10/06/2018- x2 day PICC line placed on 10/07/2018 Afebrile with no leukocytosis Per cardiology repeat 2D echo after completion of course of antibiotics. Orthopantogram no evidence of dental infection.  Resolved acute metabolic encephalopathy likely secondary to bacteremia Appears to be improving Continue IV antibiotics as indicated CT head unremarkable for any acute intracranial findings   Elevated troponin likely secondary to demand ischemia from infection/bacteremia Troponin peaked at 0.98 and trended down Cardiology has been consulted and following No work-up recommended per cardiology  Aortic stenosis status post TAVR No evidence of TAVR valve infection per TEE  CAD s/p RCA stent in 03/2018: Denies chest pain Continue cardiac medications On Plavix, aspirin, bisoprolol  Patient is intolerant of statin.    Hypertension: Is normotensive.   On bisoprolol  Homelosartan and HCTZon holdfor now  Intermittent bradycardia, likely iatrogenic from beta-blocker On bisoprolol Heart rate in the mid to upper 50s Closely monitor if needed cut down dose  Poorly controlled IDDM-2 with hyperglycemia: A1c 8.5%. On NPH 120 units daily:Blood sugar controlled, continue Lantus 40units daily, novolog6 units with meals, and sliding scale insulinandmonitor Accu-Chek..   Iron deficiency anemia/anemia of chronic disease:Hemoglobin 9.1 from 8.8  yesterday.   Low MCV 62.  Iron study with evidence of iron deficiency.    Continue ferrous sulfate 325 mg twice daily.  Resolved hyponatremia:  Hypothyroidism: Continue Synthroid  Depression:Clinically stable on Lexapro and nortriptyline.  GERD:on PPI    Code Status:Full code  Consultants:  Cardiology  Infectious disease  Pharmacy  Procedures:  TEE 10/07/18  Microbiology:  [id number] negative  Blood culture-Streptococcus species  Antimicrobials:  -Doxycycline 5/13-5/14.  -Ceftriaxone 5/14>> -Gentamicin  -Acyclovir discontinued on 6/13.    Discharge Exam: BP (!) 148/68 (BP Location: Left Arm)    Pulse (!) 54    Temp 97.8 F (36.6 C) (Oral)    Resp 20    Ht 5' 5"  (1.651 m)    Wt 92.4 kg    SpO2 94%    BMI 33.90 kg/m   General: 73 y.o. year-old female well developed well nourished in no acute distress.  Alert and oriented x3.  Cardiovascular: Regular rate and rhythm with no rubs or gallops.  No thyromegaly or JVD noted.    Respiratory: Clear to auscultation with no wheezes or rales. Good inspiratory effort.  Abdomen: Soft nontender nondistended with normal bowel sounds x4 quadrants.  Musculoskeletal: No lower extremity edema. 2/4 pulses in all 4 extremities.  Psychiatry: Mood is appropriate for condition and setting  Discharge Instructions You were cared for by a hospitalist during your hospital stay. If you have any questions about your discharge medications or the care you received while you were in the hospital after you are discharged, you can call the unit and asked to speak with the hospitalist on call if the hospitalist that took care  of you is not available. Once you are discharged, your primary care physician will handle any further medical issues. Please note that NO REFILLS for any discharge medications will be authorized once you are discharged, as it is imperative that you return to your primary care physician (or establish a  relationship with a primary care physician if you do not have one) for your aftercare needs so that they can reassess your need for medications and monitor your lab values.  Discharge Instructions    Home infusion instructions [facility] May follow [place] Dosing Protocol; May administer Cathflo as needed to maintain patency of vascular access device.; Flushing of vascular access device: per [facility] Protocol: 0.9% NaCl pre/post medica...   Complete by: As directed    Instructions: May follow [place] Dosing Protocol   Instructions: May administer Cathflo as needed to maintain patency of vascular access device.   Instructions: Flushing of vascular access device: per [facility] Protocol: 0.9% NaCl pre/post medication administration and prn patency; Heparin 100 u/ml, 41m for implanted ports and Heparin 10u/ml, 564mfor all other central venous catheters.   Instructions: May follow [facility] Anaphylaxis Protocol for First Dose Administration in the home: 0.9% NaCl at 25-50 ml/hr to maintain IV access for protocol meds. Epinephrine 0.3 ml IV/IM PRN and Benadryl 25-50 IV/IM PRN s/s of anaphylaxis.   Instructions: [facility] Coordinator (RN) to assist per patient IV care needs in the home PRN.     Allergies as of 10/09/2018      Reactions   Statins Other (See Comments)   Muscle aches      Medication List    TAKE these medications   acetaminophen 500 MG tablet Commonly known as: TYLENOL Take 1,000 mg by mouth every 6 (six) hours as needed (for pain.).   aspirin 81 MG tablet Take 81 mg by mouth daily.   bisoprolol 5 MG tablet Commonly known as: ZEBETA TAKE 1 TABLET BY MOUTH EVERY DAY   cefTRIAXone  IVPB Commonly known as: ROCEPHIN Inject 2 g into the vein daily. Indication:  Strep bacteremia with endocarditis Last Day of Therapy:  11/18/18 Labs - Once weekly:  CBC/D and BMP, Labs - Every other week:  ESR and CRP   Chlor-Trimeton 4 MG tablet Generic drug:  chlorpheniramine Take 4 mg by mouth at bedtime.   cholecalciferol 1000 units tablet Commonly known as: VITAMIN D Take 1,000 Units by mouth daily.   clopidogrel 75 MG tablet Commonly known as: PLAVIX Take 1 tablet (75 mg total) by mouth daily with breakfast.   escitalopram 20 MG tablet Commonly known as: LEXAPRO TAKE 1 TABLET BY MOUTH EVERY DAY   ferrous sulfate 325 (65 FE) MG tablet Take 1 tablet (325 mg total) by mouth 2 (two) times daily with a meal.   gentamicin  IVPB Commonly known as: GARAMYCIN Inject 80 mg into the vein daily for 12 days. Indication:  Strep bacteremia with endocarditis Last Day of Therapy:  10/21/18 Labs - Sunday/Monday:  CBC/D, BMP, and gentamicin trough. Labs - Thursday:  BMP and gentamicin trough Labs - Every other week:  ESR and CRP   hydrochlorothiazide 12.5 MG tablet Commonly known as: HYDRODIURIL TAKE 1 TABLET BY MOUTH EVERY DAY   insulin NPH Human 100 UNIT/ML injection Commonly known as: NovoLIN N ReliOn Inject 0.4 mLs (40 Units total) into the skin every morning. And syringes 1/day What changed: how much to take   Insulin Syringe-Needle U-100 31G X 5/16" 1 ML Misc Commonly  known as: BD Insulin Syringe U/F USE TO INJECT INSULIN ONCE DAILY   losartan 50 MG tablet Commonly known as: COZAAR TAKE 1 TABLET(50 MG) BY MOUTH DAILY What changed: See the new instructions.   multivitamin with minerals Tabs tablet Take 1 tablet by mouth daily.   nitroGLYCERIN 0.4 MG SL tablet Commonly known as: Nitrostat Place 1 tablet (0.4 mg total) under the tongue every 5 (five) minutes as needed for chest pain.   nortriptyline 25 MG capsule Commonly known as: PAMELOR TAKE 1 CAPSULE(25 MG) BY MOUTH AT BEDTIME What changed: See the new instructions.   OneTouch Delica Lancets 16X Misc USE TO CHECK BLOOD SUGAR TWICE DAILY What changed: See the new instructions.   OneTouch Verio test strip Generic drug: glucose blood USE TO CHECK BLOOD SUGAR TWICE  DAILY What changed: See the new instructions.   pantoprazole 40 MG tablet Commonly known as: PROTONIX Take 1 tablet (40 mg total) by mouth 2 (two) times daily.   sodium chloride 0.9 % Inject 1,000 mLs into the vein daily for 12 days. Infuse over four hours after gentamicin infusion.   Synthroid 175 MCG tablet Generic drug: levothyroxine TAKE 1 TABLET BY MOUTH EVERY DAY            Home Infusion Instuctions  (From admission, onward)         Start     Ordered   10/09/18 0000  Home infusion instructions [facility] May follow [place] Dosing Protocol; May administer Cathflo as needed to maintain patency of vascular access device.; Flushing of vascular access device: per [facility] Protocol: 0.9% NaCl pre/post medica...    Question Answer Comment  Instructions May follow [place] Dosing Protocol   Instructions May administer Cathflo as needed to maintain patency of vascular access device.   Instructions Flushing of vascular access device: per [facility] Protocol: 0.9% NaCl pre/post medication administration and prn patency; Heparin 100 u/ml, 78m for implanted ports and Heparin 10u/ml, 570mfor all other central venous catheters.   Instructions May follow [facility] Anaphylaxis Protocol for First Dose Administration in the home: 0.9% NaCl at 25-50 ml/hr to maintain IV access for protocol meds. Epinephrine 0.3 ml IV/IM PRN and Benadryl 25-50 IV/IM PRN s/s of anaphylaxis.   Instructions [facility] Infusion Coordinator (RN) to assist per patient IV care needs in the home PRN.      10/09/18 1420           Durable Medical Equipment  (From admission, onward)         Start     Ordered   10/09/18 0600  For home use only DME Bedside commode  Once    Comments: 3 n 1  Question:  Patient needs a bedside commode to treat with the following condition  Answer:  Ambulatory dysfunction   10/09/18 0600         Allergies  Allergen Reactions   Statins Other (See Comments)    Muscle  aches   Follow-up Information    [facility]-Home Follow up.   Specialty: [place]: HHRN/PT/OT arranged- they will contact you for home visits ([facility] will follow along for home IV abx needs)       NaDorothyann PengNP. Call in 1 day(s).   Specialty: Family Medicine Why: Please call for a post hospital follow-up appointment. Contact information: [phone]        SmBelva CromeMD .   Specialty:  Cardiology Contact information: [address] [place] [address] [phone]        Carlyle Basques, MD. Call in 1 day(s).   Specialty: Infectious Diseases Why: Please call for a post hospital follow-up appointment. Contact information: [place] [address] [phone]            The results of significant diagnostics from this hospitalization (including imaging, microbiology, ancillary and laboratory) are listed below for reference.    Significant Diagnostic Studies: Ct Abdomen Pelvis Wo Contrast  Result Date: 10/04/2018 CLINICAL DATA:  Sepsis EXAM: CT ABDOMEN AND PELVIS WITHOUT CONTRAST TECHNIQUE: Multidetector CT imaging of the abdomen and pelvis was performed following the standard protocol without IV contrast. COMPARISON:  06/17/2018 FINDINGS: Lower chest: No acute abnormality. Hepatobiliary: There is hepatic steatosis. The liver appears nodular in contour. The patient is status post prior cholecystectomy. Pancreas: The pancreas is atrophic and fatty replaced. Spleen: The spleen is enlarged measuring approximately 14 cm craniocaudad. Adrenals/Urinary Tract: Adrenal glands are unremarkable. Kidneys are normal, without renal calculi, focal lesion, or hydronephrosis. Bladder is unremarkable. Detection of stones is limited by the presence of IV contrast from prior contrast enhanced CT of the chest. Stomach/Bowel: There is sigmoid diverticulosis  without CT evidence of diverticulitis. The appendix is unremarkable. The stomach is unremarkable. There is no evidence of a small-bowel obstruction. Vascular/Lymphatic: Aortic atherosclerosis. No enlarged abdominal or pelvic lymph nodes. Reproductive: Uterus and bilateral adnexa are unremarkable. Other: No abdominal wall hernia or abnormality. No abdominopelvic ascites. Musculoskeletal: No acute or significant osseous findings. IMPRESSION: 1. No acute intra-abdominal abnormality detected. No findings to explain the patient's sepsis. 2. Cirrhotic appearing liver with stigmata of portal hypertension including splenomegaly. 3. Status post cholecystectomy. 4. Normal appendix in the right lower quadrant. 5. No hydronephrosis. 6.  Choose 1 Electronically Signed   By: Constance Holster M.D.   On: 10/04/2018 03:03   Dg Orthopantogram  Result Date: 10/07/2018 CLINICAL DATA:  Mitral valve endocarditis. EXAM: ORTHOPANTOGRAM/PANORAMIC COMPARISON:  None. FINDINGS: Multiple dental implants and amalgam are noted. No dental caries or periapical lucencies. IMPRESSION: No evidence of tooth infection. Electronically Signed   By: Titus Dubin M.D.   On: 10/07/2018 19:43   Ct Head Wo Contrast  Result Date: 10/04/2018 CLINICAL DATA:  Encephalopathy EXAM: CT HEAD WITHOUT CONTRAST TECHNIQUE: Contiguous axial images were obtained from the base of the skull through the vertex without intravenous contrast. COMPARISON:  None. FINDINGS: Brain: Mild age related volume loss. No acute intracranial abnormality. Specifically, no hemorrhage, hydrocephalus, mass lesion, acute infarction, or significant intracranial injury. Vascular: No hyperdense vessel or unexpected calcification. Skull: No acute calvarial abnormality. Sinuses/Orbits: Visualized paranasal sinuses and mastoids clear. Orbital soft tissues unremarkable. Other: None IMPRESSION: No acute intracranial abnormality. Electronically Signed   By: Rolm Baptise M.D.   On: 10/04/2018  03:51   Ct Angio Chest Pe W And/or Wo Contrast  Result Date: 10/04/2018 CLINICAL DATA:  Shortness of breath EXAM: CT ANGIOGRAPHY CHEST WITH CONTRAST TECHNIQUE: Multidetector CT imaging of the chest was performed using the standard protocol during bolus administration of intravenous contrast. Multiplanar CT image reconstructions and MIPs were obtained to evaluate the vascular anatomy. CONTRAST:  19m OMNIPAQUE IOHEXOL 350 MG/ML SOLN COMPARISON:  CT chest dated 06/18/2018 FINDINGS: Cardiovascular: The heart size is significantly enlarged. The patient is status post TAVR. Mitral valve calcifications are noted. Coronary artery calcifications are noted. Thoracic aortic calcifications are noted. Mediastinum/Nodes: The patient appears to be status post prior  thyroidectomy. There are no pathologically enlarged mediastinal or hilar lymph nodes. No enlarged axillary or supraclavicular lymph nodes. Lungs/Pleura: The lung volumes are low. There is no large pleural effusion. No pneumothorax. The trachea is unremarkable. There is presumed atelectasis at the lung bases bilaterally, left worse than right. Upper Abdomen: The liver appears enlarged. There is likely underlying hepatic steatosis. The patient is status post prior cholecystectomy. The spleen is enlarged. Musculoskeletal: No chest wall abnormality. No acute or significant osseous findings. Review of the MIP images confirms the above findings. IMPRESSION: 1. No PE. 2. Cardiomegaly.  The patient is status post TAVR. 3. Low lung volumes. There is no pneumothorax or significant pleural effusion. 4. Probable hepatic steatosis and hepatomegaly. 5. Splenomegaly. Aortic Atherosclerosis (ICD10-I70.0). Electronically Signed   By: Constance Holster M.D.   On: 10/04/2018 00:16   Dg Chest Portable 1 View  Result Date: 10/03/2018 CLINICAL DATA:  Shortness of breath EXAM: PORTABLE CHEST 1 VIEW COMPARISON:  07/08/2018 FINDINGS: The heart is enlarged. The patient is status post  prior TAVR. There is no pneumothorax. No large pleural effusion. No significant area of consolidation. There is no pneumothorax. No acute osseous abnormality. IMPRESSION: No active disease. Electronically Signed   By: Constance Holster M.D.   On: 10/03/2018 22:21   US Abdomen Ruq W/elastography  Result Date: 09/30/2018 CLINICAL DATA:  Evaluate for cirrhosis.  Increased LFTs. EXAM: US ABDOMEN LIMITED - RIGHT UPPER QUADRANT ULTRASOUND HEPATIC ELASTOGRAPHY TECHNIQUE: Limited right upper quadrant abdominal ultrasound was performed. In addition, ultrasound elastography evaluation of the liver was performed. A region of interest was placed in the right lobe of the liver. Following application of a compressive sonographic pulse, shear waves were detected in the adjacent hepatic tissue and the shear wave velocity was calculated. Multiple assessments were performed at the selected site. Median shear wave velocity is correlated to a Metavir fibrosis score. COMPARISON:  CT AP 06/17/2018 FINDINGS: ULTRASOUND ABDOMEN LIMITED RIGHT UPPER QUADRANT Gallbladder: Status post cholecystectomy. Common bile duct: Diameter: 6 mm Liver: Diffuse coarsened echotexture noted. No mass identified. Portal vein is patent on color Doppler imaging with normal direction of blood flow towards the liver. ULTRASOUND HEPATIC ELASTOGRAPHY Device: Siemens Helix VTQ Patient position: Oblique Transducer 6C1 Number of measurements: 10 Hepatic segment:  8 Median velocity:   1.4 m/sec IQR: 0.3 IQR/Median velocity ratio: 0.21 Corresponding Metavir fibrosis score:  F2 + some F3 Risk of fibrosis: Moderate Limitations of exam: Coughing and shortness of breath Please note that abnormal shear wave velocities may also be identified in clinical settings other than with hepatic fibrosis, such as: acute hepatitis, elevated right heart and central venous pressures including use of beta blockers, veno-occlusive disease (Budd-Chiari), infiltrative processes such as  mastocytosis/amyloidosis/infiltrative tumor, extrahepatic cholestasis, in the post-prandial state, and liver transplantation. Correlation with patient history, laboratory data, and clinical condition recommended. IMPRESSION: ULTRASOUND ABDOMEN: 1. Coarsened echotexture of the liver compatible with hepatic steatosis. 2. Status post cholecystectomy. ULTRASOUND HEPATIC ELASTOGRAHY: Median hepatic shear wave velocity is calculated at 1.4 m/sec. Corresponding Metavir fibrosis score is F2 + some F3. Risk of fibrosis is High. Follow-up: Additional testing appropriate Electronically Signed   By: Kerby Moors M.D.   On: 09/30/2018 11:47   [name] Ekg Site Rite  Result Date: 10/09/2018 If Site Rite image not attached, placement could not be confirmed due to current cardiac rhythm.   Microbiology: Recent Results (from the past 240 hour(s))  Culture, blood (Routine x 2)     Status: Abnormal   Collection  Time: 10/03/18  9:30 PM   Specimen: BLOOD RIGHT HAND  Result Value Ref Range Status   Specimen Description BLOOD RIGHT HAND  Final   Special Requests   Final    BOTTLES DRAWN AEROBIC AND ANAEROBIC Blood Culture results may not be optimal due to an excessive volume of blood received in culture bottles   Culture  Setup Time   Final    GRAM POSITIVE COCCI IN CHAINS IN BOTH AEROBIC AND ANAEROBIC BOTTLES CRITICAL VALUE NOTED.  VALUE IS CONSISTENT WITH PREVIOUSLY REPORTED AND CALLED VALUE.    Culture (A)  Final    STREPTOCOCCUS SALIVARIUS SUSCEPTIBILITIES PERFORMED ON PREVIOUS CULTURE WITHIN THE LAST 5 DAYS. Performed at [facility], [place] [address], [place], [place] [address]    Report Status 10/07/2018 FINAL  Final  Culture, blood (Routine x 2)     Status: Abnormal   Collection Time: 10/03/18  9:40 PM   Specimen: BLOOD LEFT HAND  Result Value Ref Range Status   Specimen Description BLOOD LEFT HAND  Final   Special Requests   Final    BOTTLES DRAWN AEROBIC ONLY Blood Culture results may not be  optimal due to an excessive volume of blood received in culture bottles   Culture  Setup Time   Final    GRAM POSITIVE COCCI IN CHAINS AEROBIC BOTTLE ONLY CRITICAL RESULT CALLED TO, READ BACK BY AND VERIFIED WITH: RHRMD L SEAY @2211  10/04/18 BY S GEZAHEGN Performed at [facility], [place] [address], [place], [place] [address]    Culture STREPTOCOCCUS SALIVARIUS (A)  Final   Report Status 10/07/2018 FINAL  Final   Organism ID, Bacteria STREPTOCOCCUS SALIVARIUS  Final      Susceptibility   Streptococcus salivarius - MIC*    PENICILLIN 0.5 INTERMEDIATE Intermediate     CEFTRIAXONE 0.25 SENSITIVE Sensitive     ERYTHROMYCIN 2 RESISTANT Resistant     LEVOFLOXACIN 0.5 SENSITIVE Sensitive     VANCOMYCIN 0.5 SENSITIVE Sensitive     * STREPTOCOCCUS SALIVARIUS  Blood Culture ID Panel (Reflexed)     Status: Abnormal   Collection Time: 10/03/18  9:40 PM  Result Value Ref Range Status   Enterococcus species NOT DETECTED NOT DETECTED Final   Listeria monocytogenes NOT DETECTED NOT DETECTED Final   Staphylococcus species NOT DETECTED NOT DETECTED Final   Staphylococcus aureus (BCID) NOT DETECTED NOT DETECTED Final   Streptococcus species DETECTED (A) NOT DETECTED Final    Comment: Not Enterococcus species, Streptococcus agalactiae, Streptococcus pyogenes, or Streptococcus pneumoniae. CRITICAL RESULT CALLED TO, READ BACK BY AND VERIFIED WITH: PHRMD L SEAY @2211  10/04/18 BY S GEZAHEGN    Streptococcus agalactiae NOT DETECTED NOT DETECTED Final   Streptococcus pneumoniae NOT DETECTED NOT DETECTED Final   Streptococcus pyogenes NOT DETECTED NOT DETECTED Final   Acinetobacter baumannii NOT DETECTED NOT DETECTED Final   Enterobacteriaceae species NOT DETECTED NOT DETECTED Final   Enterobacter cloacae complex NOT DETECTED NOT DETECTED Final   Escherichia coli NOT DETECTED NOT DETECTED Final   Klebsiella oxytoca NOT DETECTED NOT DETECTED Final   Klebsiella pneumoniae NOT DETECTED NOT DETECTED Final     Proteus species NOT DETECTED NOT DETECTED Final   Serratia marcescens NOT DETECTED NOT DETECTED Final   Haemophilus influenzae NOT DETECTED NOT DETECTED Final   Neisseria meningitidis NOT DETECTED NOT DETECTED Final   Pseudomonas aeruginosa NOT DETECTED NOT DETECTED Final   Candida albicans NOT DETECTED NOT DETECTED Final   Candida glabrata NOT DETECTED NOT DETECTED Final  Candida krusei NOT DETECTED NOT DETECTED Final   Candida parapsilosis NOT DETECTED NOT DETECTED Final   Candida tropicalis NOT DETECTED NOT DETECTED Final    Comment: Performed at [facility], [place] [address] [place], [place] [address]  SARS Coronavirus 2     Status: None   Collection Time: 10/03/18 10:28 PM  Result Value Ref Range Status   SARS Coronavirus 2 NOT DETECTED NOT DETECTED Final    Comment: (NOTE) SARS-CoV-2 target nucleic acids are NOT DETECTED. The SARS-CoV-2 RNA is generally detectable in upper and lower respiratory specimens during the acute phase of infection.  Negative  results do not preclude SARS-CoV-2 infection, do not rule out co-infections with other pathogens, and should not be used as the sole basis for treatment or other patient management decisions.  Negative results must be combined with clinical observations, patient history, and epidemiological information. The expected result is Not Detected. Fact Sheet for Patients: http://www.biofiredefense.com/wp-content/uploads/2020/03/BIOFIRE-COVID -19-patients.pdf Fact Sheet for Healthcare Providers: http://www.biofiredefense.com/wp-content/uploads/2020/03/BIOFIRE-COVID -19-hcp.pdf This test is not yet approved or cleared by the [place] and  has been authorized for detection and/or diagnosis of SARS-CoV-2 by FDA under an Emergency Use Authorization (EUA).  This EUA will remain in effec t (meaning this test can be used) for the duration of  the COVID-19 declaration under Section 564(b)(1) of the Act, 21 U.S.C. section  360bbb-3(b)(1), unless the authorization is terminated or revoked sooner. Performed at [facility], [place] [address], [place], [place] [address]   Culture, blood (routine x 2)     Status: None (Preliminary result)   Collection Time: 10/06/18  6:31 AM   Specimen: BLOOD  Result Value Ref Range Status   Specimen Description BLOOD RIGHT ANTECUBITAL  Final   Special Requests   Final    BOTTLES DRAWN AEROBIC ONLY Blood Culture adequate volume   Culture   Final    NO GROWTH 3 DAYS Performed at [facility], [place] [address], [place], [place] [address]    Report Status PENDING  Incomplete  Culture, blood (routine x 2)     Status: None (Preliminary result)   Collection Time: 10/06/18  6:31 AM   Specimen: BLOOD  Result Value Ref Range Status   Specimen Description BLOOD LEFT ANTECUBITAL  Final   Special Requests   Final    BOTTLES DRAWN AEROBIC ONLY Blood Culture adequate volume   Culture   Final    NO GROWTH 3 DAYS Performed at [facility], [place] [address], [place], [place] [address]    Report Status PENDING  Incomplete     Labs: Basic Metabolic Panel: Recent Labs  Lab 10/03/18 2136 10/04/18 0750 10/05/18 0433 10/06/18 0631 10/07/18 0412 10/08/18 0524 10/09/18 0331  NA 132* 135 138 138  --   --  139  K 3.9 3.6 3.3* 3.5  --   --  4.1  CL 99 105 108 107  --   --  107  CO2 19* 20* 21* 23  --   --  23  GLUCOSE 143* 147* 110* 115*  --   --  91  BUN 25* 21 11 6*  --   --  6*  CREATININE 1.02* 0.91 0.74 0.69  --   --  0.64  CALCIUM 9.6 8.4* 8.9 9.4  --   --  9.6  MG  --   --  2.0 1.9 1.7 1.8 1.7   Liver Function Tests: Recent Labs  Lab 10/03/18 2136 10/04/18 0750  10/05/18 0433  AST 46* 38 34  ALT 41 30 26  ALKPHOS 85 78 66  BILITOT 1.9* 1.5* 0.9  PROT 7.3 6.1* 6.0*  ALBUMIN 3.1* 2.7* 2.6*   No results for input(s): LIPASE, AMYLASE in the last 168 hours. No results for input(s): AMMONIA in the last 168 hours. CBC: Recent Labs  Lab 10/03/18 2136   10/05/18 0433 10/06/18 0631 10/07/18 0412 10/08/18 0524 10/09/18 0331  WBC 16.2*   < > 8.4 7.3 6.8 8.6 9.1  NEUTROABS 14.6*  --   --   --   --   --   --   HGB 10.6*   < > 8.6* 9.0* 8.8* 9.1* 9.8*  HCT 33.9*   < > 28.4* 29.2* 28.6* 29.8* 31.9*  MCV 61.4*   < > 62.1* 62.1* 61.4* 62.3* 62.2*  PLT 208   < > 146* 175 177 198 202   < > = values in this interval not displayed.   Cardiac Enzymes: Recent Labs  Lab 10/03/18 2136 10/04/18 0100 10/04/18 0440  TROPONINI 0.98* 0.91* 0.83*   BNP: BNP (last 3 results) Recent Labs    07/07/18 1630  BNP 112.2*    ProBNP (last 3 results) Recent Labs    02/19/18 1233  PROBNP 270    CBG: Recent Labs  Lab 10/08/18 1624 10/08/18 2111 10/09/18 0401 10/09/18 0702 10/09/18 1109  GLUCAP 136* 146* 85 96 181*       Signed:  Kayleen Memos, MD [facility] 10/09/2018, 2:40 PM

## 2018-10-09 NOTE — Discharge Instructions (Signed)
Endocarditis  Endocarditis is an infection of the inner layer of the heart (endocardium) or an infection of the heart valves. Endocarditis can cause growths inside the heart or on the heart valves. Over time, these growths can destroy heart tissue and cause heart failure or problems with heart rhythm. They can also cause stroke if they break away and form a blood clot in the brain. Early treatment offers the best chance for curing endocarditis and preventing complications. What are the causes? This condition may be caused by:  Germs that normally live in or on your body. The germs that most commonly cause endocarditis are bacteria.  A fungus. What increases the risk? This condition is more likely to develop in people who have:  A heart defect.  Artificial (prosthetic) heart valves.  An abnormal or damaged heart valve.  A history of endocarditis. Having certain procedures may also increase the risk of germs getting into the heart or bloodstream. What are the signs or symptoms? Symptoms of this condition may start suddenly, or they may start slowly and gradually get worse. Symptoms include:  Fever.  Chills.  Night sweats.  Muscle aches.  Fatigue.  Weakness.  Shortness of breath.  Chest pain.  Blood spots in the eyes.  Bleeding under the fingernails or toenails.  Painless red spots on the palms.  Painful lumps in the fingertips or toes.  Swelling in the feet or ankles. How is this diagnosed? This condition may be diagnosed based on:  A physical exam. Your health care provider will listen to your heart to check for abnormal heart sounds (murmur). He or she may also use a scope to check for bleeding at the back of your eyes (retinas).  Tests. They may include: ? Blood tests to look for the germs that cause endocarditis. ? Imaging tests. A chest x-ray, CT scan, or echocardiogram may be used to create an image of your heart. A type of echocardiogram called a  transesophageal echocardiogram may be done to look at certain heart valves more closely. How is this treated? Treatment for this condition depends on the cause of the endocarditis. Treatment may include:  Antibiotic medicines. These may be given through a tube into one of your veins (IV antibiotics) or taken by mouth. You may need to be on more than one antibiotic medicine.  Surgery to replace your heart valve. You may need surgery if: ? The endocarditis does not respond to treatment. ? You develop complications. ? Your heart valve is severely damaged. Follow these instructions at home: Medicines  Take over-the-counter and prescription medicines only as told by your health care provider.  If you were prescribed an antibiotic medicine, take it as told by your health care provider. Do not stop taking the antibiotic even if you start to feel better. You may need to be on intravenous antibiotics for several weeks.  Do not use IV drugs unless it is part of your medical treatment. Lifestyle  Do not get tattoos or body piercings.  Practice good oral hygiene. This includes: ? Brushing and flossing regularly. ? Scheduling routine dental appointments.  Do not use any products that contain nicotine or tobacco, such as cigarettes and e-cigarettes. If you need help quitting, ask your health care provider.  Limit alcohol intake to no more than 1 drink a day for nonpregnant women and 2 drinks a day for men. One drink equals 12 oz of beer, 5 oz of wine, or 1 oz of hard liquor. General instructions  Let  your health care provider know before you have any dental or surgical procedures. You may need to take antibiotics before the procedure.  Tell all of your health care providers, including your dentist, that you have had endocarditis.  Gradually resume your usual activities.  Keep all follow-up visits as told by your health care provider. This is important. Contact a health care provider  if:  You have a fever.  Your symptoms do not improve.  Your symptoms get worse.  Your symptoms come back. Get help right away if:  You have trouble breathing.  You have chest pain.  You have symptoms of a stroke. These include: ? Sudden weakness. ? Numbness. ? Confusion. ? Trouble talking or understanding. ? A severe headache. Summary  Endocarditis is an infection of the inner layer of the heart (endocardium) or heart valves. It is caused by bacteria or a fungus.  Having certain heart conditions or procedures may increase the risk of endocarditis.  Antibiotics are an important treatment for endocarditis. Take these medicines as told by your health care provider. Do not stop taking them even if you start to feel better.  Tell all of your health care providers, including your dentist, that you have had endocarditis. This information is not intended to replace advice given to you by your health care provider. Make sure you discuss any questions you have with your health care provider. Document Released: 04/09/2005 Document Revised: 01/20/2016 Document Reviewed: 01/20/2016 Elsevier Interactive Patient Education  2019 Elsevier Inc. Bacteremia, Adult Bacteremia is the presence of bacteria in the blood. When bacteria enter the bloodstream, they can cause a life-threatening reaction called sepsis, which is a medical emergency. Bacteremia can spread to other parts of the body, including the heart, joints, and brain. What are the causes? This condition is caused by bacteria that get into the blood.  Bacteria can enter the blood: ? From a skin infection or injury, such as a burn or a cut. ? From a lung infection (pneumonia). ? From an infection in your stomach or intestines (gastrointestinal infection). ? From an infection in your bladder or urinary system (urinary tract infection). ? During a dental or medical procedure. ? From bleeding gums. ? When a bacterial infection in  another part of your body spreads to your blood. ? Through an unclean (contaminated) needle. What increases the risk? This condition is more likely to develop in children, the elderly, and people who:  Have a long-term (chronic) disease or condition like diabetes or chronic kidney failure.  Have an artificial joint or heart valve.  Have heart valve disease.  Have a tube inserted to treat a medical condition, such as a urinary catheter or IV.  Have a weak disease-fighting system (immune system).  Inject illegal drugs.  Have been hospitalized for more than 10 days in a row. What are the signs or symptoms? Symptoms of this condition include:  Fever.  Chills.  Fast heartbeat.  Shortness of breath.  Dizziness.  Weakness.  Confusion.  Nausea or vomiting.  Diarrhea.  Low blood pressure.  Decreased urine output. Bacteremia that has spread to other parts of the body may cause symptoms in those areas. In some cases, there are no symptoms. How is this diagnosed? This condition may be diagnosed with a physical exam and tests, such as:  A complete blood count (CBC). This test checks for signs of infection.  Blood cultures. These check for bacteria in your blood.  Tests of any tubes that you have had inserted.  These tests check for a source of infection.  Urine tests, including urine cultures. These check for bacteria in the urine that could be a source of infection.  Imaging tests, such as an X-ray, CT scan, MRI, or heart ultrasound. These check for a source of infection in other parts of your body, such as your lungs, heart valves, or joints. How is this treated? This condition is usually treated in the hospital. Treatment may involve:  Antibiotic medicines. These may be given by mouth (orally) or directly into your blood through an IV (infusion through your vein). ? Depending on the source of infection, you may need antibiotics for several weeks. ? At first, you may  be given an antibiotic to kill most types of blood bacteria (broad-spectrum antibiotic). If your test results show that a certain kind of bacteria is causing the problem, you may be given a different antibiotic to kill that specific bacteria.  IV fluids.  Removing any catheter or device that could be a source of infection.  Blood pressure and breathing support, if needed.  Surgery to control the source or the spread of infection, such as surgery to remove an infected device, abscess, or tissue.  Having follow-up visits for medicines, blood tests, and further evaluation. Follow these instructions at home: Medicines  Take over-the-counter and prescription medicines only as told by your health care provider.  If you were prescribed an antibiotic medicine, take it as told by your health care provider. Do not stop taking the antibiotic even if you start to feel better. General instructions   Rest as needed. Ask your health care provider when you may return to normal activities.  Drink enough fluid to keep your urine pale yellow.  Do not use any products that contain nicotine or tobacco, such as cigarettes and e-cigarettes. If you need help quitting, ask your health care provider.  Keep all follow-up visits as told by your health care provider. This is important. How is this prevented?   Wash your hands regularly with soap and water. If soap and water are not available, use hand sanitizer.  You should wash your hands: ? After using the toilet or changing a diaper. ? Before preparing, cooking, or serving food. ? While caring for a sick person or while visiting someone in a hospital. ? Before and after changing bandages (dressings) over wounds.  Clean any scrapes or cuts with soap and water and cover them with clean dressings.  Get vaccinations as recommended by your health care provider.  Practice good oral hygiene. Brush your teeth two times a day, and floss regularly.  Take  good care of your skin. This includes bathing and moisturizing on a regular basis. Get help right away if you have:  Pain.  A fever or chills.  Trouble breathing.  A fast heart rate.  Skin that is blotchy, pale, or clammy.  Confusion.  Weakness.  Lack of energy (lethargy) or unusual sleepiness.  Diarrhea.  New symptoms that develop after treatment has started. These symptoms may represent a serious problem that is an emergency. Do not wait to see if the symptoms will go away. Get medical help right away. Call your local emergency services (911 in the [place] Do not drive yourself to the hospital. Summary  Bacteremia is the presence of bacteria in the blood. When bacteria enter the bloodstream, they can cause a life-threatening reaction called sepsis.  Some symptoms of bacteremia include fever, chills, shortness of breath, confusion, nausea or vomiting, and diarrhea.  Tests may be done to find the source of infection that led to bacteremia. These tests may include blood tests, urine tests, and imaging tests.  Bacteremia is usually treated with antibiotic medicines in the hospital.  Get help right away if you have any new symptoms that develop after treatment has started. This information is not intended to replace advice given to you by your health care provider. Make sure you discuss any questions you have with your health care provider. Document Released: 01/21/2006 Document Revised: 08/19/2017 Document Reviewed: 08/19/2017 Elsevier Interactive Patient Education  2019 [employer].

## 2018-10-09 NOTE — Progress Notes (Addendum)
PHARMACY CONSULT NOTE FOR:  OUTPATIENT  PARENTERAL ANTIBIOTIC THERAPY (OPAT)  Indication: Strep bacteremia with endocarditis Regimen: Rocephin 2gm IV Q24H and gentamicin 21m IV Q24H 1L NS infused over four hours daily  End date: 11/18/18 for Rocephin and 10/21/18 for gentamicin  IV antibiotic discharge orders are pended. To discharging provider:  please sign these orders via discharge navigator,  Select New Orders & click on the button choice - Manage This Unsigned Work.     Thank you for allowing pharmacy to be a part of this patient's care.  Thuy D. DMina Marble PharmD, BCPS, [place]6/18/2020, 6:21 AM   Modified above due to changes in dosing based through levels.  AAzzie RoupD PGY1 Pharmacy Resident  Phone [phone] use AMION for clinical pharmacists numbers  10/09/2018      10:38 AM

## 2018-10-09 NOTE — Telephone Encounter (Signed)
Dr. Tamala Julian and Anderson Malta, the pts Husband is calling in quite a but of panic and needing a letter written and sent in the pts mychart, to support why she cannot fly for the next 6 weeks, due to her current medical condition and recent hospitalization.  Pts husband states that on 6/26-6/30 there were flights with insurance purchased by another individual, for the pt to fly up to [place] to have major dental work done.  The DDS who does her dental work actually paid for the flights to have the pt flown to have this done.  Per the pts Husband, the Dentist who purchased the flights has insurance on the tickets, but in order to get his reimbursement, there needs to be a short supporting letter stating why its contraindicated for the pt to fly for the next 6 weeks.   Below is bullet points in which the spouse states the letter needs to include:  -Pts name and Cardiologist name -Date of which pt was just hospitalized last week and symptoms she experienced. -date of when symptoms occurred. -notation that pt is not fit for travel at this moment due to her current condition.   Pts husband said if Dr. Tamala Julian could write this in letter format and send in the pts mychart account, they can get this to the DDS who paid for the flights, so that he can be reimbursed. Informed the pts Husband that Dr. Tamala Julian and his Nurse are out of the office today, but I will route this request to them as high priority, and to further advise and follow-up with the pts Husband upon completion. Pts husband verbalized understanding and agrees with this plan.

## 2018-10-09 NOTE — Telephone Encounter (Signed)
Was going to schedule patient for EGD/Colon in [facility] for July, (per Dr. Havery Moros), patient still in hospital.

## 2018-10-09 NOTE — Progress Notes (Signed)
Stable

## 2018-10-09 NOTE — Progress Notes (Signed)
Physical Therapy Treatment Patient Details Name: Breanna Webster MRN: [id number] DOB: [birth year] Today's Date: 10/09/2018    History of Present Illness 73 y.o. female with medical history significant of TAVR 06/2018, CAD, HTN, DM2.  Patient admitted with c/o SOB, AMS and sepsis with elevated troponin due to demand ischemia    PT Comments    Improved stability, but still guarded.  Improved mentation, but still a little confused.  Pt should progress well.   Follow Up Recommendations  Home health PT;Supervision/Assistance - 24 hour     Equipment Recommendations  None recommended by PT    Recommendations for Other Services       Precautions / Restrictions Precautions Precautions: Fall    Mobility  Bed Mobility       Sidelying to sit: (with moderate use of the rail)       General bed mobility comments: pt sitting EOB eating dinner on arrival  Transfers Overall transfer level: Needs assistance   Transfers: Sit to/from Stand Sit to Stand: Supervision         General transfer comment: better safety today  Ambulation/Gait Ambulation/Gait assistance: Min guard Gait Distance (Feet): 140 Feet Assistive device: None(rails) Gait Pattern/deviations: Step-through pattern Gait velocity: slower Gait velocity interpretation: <1.8 ft/sec, indicate of risk for recurrent falls General Gait Details: episodes of mild instability, generally guarded, reaching for the rails on Journalist, newspaper    Modified Rankin (Stroke Patients Only)       Balance Overall balance assessment: Needs assistance Sitting-balance support: No upper extremity supported Sitting balance-Leahy Scale: Good     Standing balance support: No upper extremity supported;During functional activity Standing balance-Leahy Scale: Fair Standing balance comment: washing hands, hygiene after toileting without assist                            Cognition  Arousal/Alertness: Awake/alert Behavior During Therapy: WFL for tasks assessed/performed Overall Cognitive Status: Impaired/Different from baseline(but improving)                               Problem Solving: Slow processing        Exercises      General Comments        Pertinent Vitals/Pain      Home Living                      Prior Function            PT Goals (current goals can now be found in the care plan section) Acute Rehab PT Goals Patient Stated Goal: get home with her husband PT Goal Formulation: With patient Time For Goal Achievement: 10/19/18 Potential to Achieve Goals: Good Progress towards PT goals: Progressing toward goals    Frequency    Min 3X/week      PT Plan Current plan remains appropriate    Co-evaluation              AM-PAC PT "6 Clicks" Mobility   Outcome Measure  Help needed turning from your back to your side while in a flat bed without using bedrails?: None Help needed moving from lying on your back to sitting on the side of a flat bed without using bedrails?: None Help needed moving to and from a bed to a chair (including  a wheelchair)?: None Help needed standing up from a chair using your arms (e.g., wheelchair or bedside chair)?: None Help needed to walk in hospital room?: A Little Help needed climbing 3-5 steps with a railing? : A Little 6 Click Score: 22    End of Session   Activity Tolerance: Patient tolerated treatment well Patient left: in chair;with call bell/phone within reach;with chair alarm set Nurse Communication: Mobility status PT Visit Diagnosis: Other abnormalities of gait and mobility (R26.89);Muscle weakness (generalized) (M62.81)     Time: 6282-4175 PT Time Calculation (min) (ACUTE ONLY): 19 min  Charges:  $Gait Training: 8-22 mins                     10/09/2018  Breanna Webster, PT Acute Rehabilitation Services [phone]  (pager) [phone]   (office)   Breanna Webster 10/09/2018, 5:54 PM

## 2018-10-09 NOTE — Progress Notes (Addendum)
[facility] Infectious [facility]   Date of Admission: 10/03/2018   Antibiotic Days: 7  Ceftriaxone 6  Gentamicin 3   Reason for visit:  Follow up on native mitral valve endocarditis Streptococcal salivarius bacteremia    Interval History:  Ready to go home. No complaints. Requesting some clarity about plan and what was found regarding work up. Questions about PICC line. Some shortness of breath with stronger exertion    Review of Systems: Constitutional: negative for fevers and chills Gastrointestinal: negative for nausea and diarrhea Pulm: +shortness of breath with exertion.  Integument/breast: negative for rash  Physical Exam: Vitals:   10/09/18 0000 10/09/18 0801  BP: (!) 161/63 (!) 154/71  Pulse: 65 (!) 54  Resp: 20   Temp:  97.6 F (36.4 C)  SpO2: 95% 94%   Constitutional: Sitting up in recliner. Alert and oriented.  HENT: no thrush Respiratory: Normal respiratory effort; CTA B Cardiovascular: RRR, +systolic murmur 7-0/3 GI: soft, nt, nd   Lab Results  Component Value Date   WBC 9.1 10/09/2018   HGB 9.8 (L) 10/09/2018   HCT 31.9 (L) 10/09/2018   MCV 62.2 (L) 10/09/2018   PLT 202 10/09/2018    Lab Results  Component Value Date   CREATININE 0.64 10/09/2018   BUN 6 (L) 10/09/2018   NA 139 10/09/2018   K 4.1 10/09/2018   CL 107 10/09/2018   CO2 23 10/09/2018    Lab Results  Component Value Date   ALT 26 10/05/2018   AST 34 10/05/2018   ALKPHOS 66 10/05/2018     Microbiology: Recent Results (from the past 240 hour(s))  Culture, blood (Routine x 2)     Status: Abnormal   Collection Time: 10/03/18  9:30 PM   Specimen: BLOOD RIGHT HAND  Result Value Ref Range Status   Specimen Description BLOOD RIGHT HAND  Final   Special Requests   Final    BOTTLES DRAWN AEROBIC AND ANAEROBIC Blood Culture results may not be optimal due to an excessive volume of blood received in culture bottles   Culture  Setup Time   Final    GRAM POSITIVE COCCI IN  CHAINS IN BOTH AEROBIC AND ANAEROBIC BOTTLES CRITICAL VALUE NOTED.  VALUE IS CONSISTENT WITH PREVIOUSLY REPORTED AND CALLED VALUE.    Culture (A)  Final    STREPTOCOCCUS SALIVARIUS SUSCEPTIBILITIES PERFORMED ON PREVIOUS CULTURE WITHIN THE LAST 5 DAYS. Performed at [facility], [place] [address] [place], [place] [address]    Report Status 10/07/2018 FINAL  Final  Culture, blood (Routine x 2)     Status: Abnormal   Collection Time: 10/03/18  9:40 PM   Specimen: BLOOD LEFT HAND  Result Value Ref Range Status   Specimen Description BLOOD LEFT HAND  Final   Special Requests   Final    BOTTLES DRAWN AEROBIC ONLY Blood Culture results may not be optimal due to an excessive volume of blood received in culture bottles   Culture  Setup Time   Final    GRAM POSITIVE COCCI IN CHAINS AEROBIC BOTTLE ONLY CRITICAL RESULT CALLED TO, READ BACK BY AND VERIFIED WITH: RHRMD L SEAY _0  10/04/18 BY S GEZAHEGN Performed at [facility], [place] [address], [place], [place] [address]    Culture STREPTOCOCCUS SALIVARIUS (A)  Final   Report Status 10/07/2018 FINAL  Final   Organism ID, Bacteria STREPTOCOCCUS SALIVARIUS  Final      Susceptibility   Streptococcus salivarius - MIC*  PENICILLIN 0.5 INTERMEDIATE Intermediate     CEFTRIAXONE 0.25 SENSITIVE Sensitive     ERYTHROMYCIN 2 RESISTANT Resistant     LEVOFLOXACIN 0.5 SENSITIVE Sensitive     VANCOMYCIN 0.5 SENSITIVE Sensitive     * STREPTOCOCCUS SALIVARIUS  Blood Culture ID Panel (Reflexed)     Status: Abnormal   Collection Time: 10/03/18  9:40 PM  Result Value Ref Range Status   Enterococcus species NOT DETECTED NOT DETECTED Final   Listeria monocytogenes NOT DETECTED NOT DETECTED Final   Staphylococcus species NOT DETECTED NOT DETECTED Final   Staphylococcus aureus (BCID) NOT DETECTED NOT DETECTED Final   Streptococcus species DETECTED (A) NOT DETECTED Final    Comment: Not Enterococcus species, Streptococcus agalactiae, Streptococcus  pyogenes, or Streptococcus pneumoniae. CRITICAL RESULT CALLED TO, READ BACK BY AND VERIFIED WITH: PHRMD L SEAY _0  10/04/18 BY S GEZAHEGN    Streptococcus agalactiae NOT DETECTED NOT DETECTED Final   Streptococcus pneumoniae NOT DETECTED NOT DETECTED Final   Streptococcus pyogenes NOT DETECTED NOT DETECTED Final   Acinetobacter baumannii NOT DETECTED NOT DETECTED Final   Enterobacteriaceae species NOT DETECTED NOT DETECTED Final   Enterobacter cloacae complex NOT DETECTED NOT DETECTED Final   Escherichia coli NOT DETECTED NOT DETECTED Final   Klebsiella oxytoca NOT DETECTED NOT DETECTED Final   Klebsiella pneumoniae NOT DETECTED NOT DETECTED Final   Proteus species NOT DETECTED NOT DETECTED Final   Serratia marcescens NOT DETECTED NOT DETECTED Final   Haemophilus influenzae NOT DETECTED NOT DETECTED Final   Neisseria meningitidis NOT DETECTED NOT DETECTED Final   Pseudomonas aeruginosa NOT DETECTED NOT DETECTED Final   Candida albicans NOT DETECTED NOT DETECTED Final   Candida glabrata NOT DETECTED NOT DETECTED Final   Candida krusei NOT DETECTED NOT DETECTED Final   Candida parapsilosis NOT DETECTED NOT DETECTED Final   Candida tropicalis NOT DETECTED NOT DETECTED Final    Comment: Performed at [facility], [place]. [address], [place], [place] [address]  SARS Coronavirus 2     Status: None   Collection Time: 10/03/18 10:28 PM  Result Value Ref Range Status   SARS Coronavirus 2 NOT DETECTED NOT DETECTED Final    Comment: (NOTE) SARS-CoV-2 target nucleic acids are NOT DETECTED. The SARS-CoV-2 RNA is generally detectable in upper and lower respiratory specimens during the acute phase of infection.  Negative  results do not preclude SARS-CoV-2 infection, do not rule out co-infections with other pathogens, and should not be used as the sole basis for treatment or other patient management decisions.  Negative results must be combined with clinical observations, patient history, and  epidemiological information. The expected result is Not Detected. Fact Sheet for Patients: http://www.biofiredefense.com/wp-content/uploads/2020/03/BIOFIRE-COVID -19-patients.pdf Fact Sheet for Healthcare Providers: http://www.biofiredefense.com/wp-content/uploads/2020/03/BIOFIRE-COVID -19-hcp.pdf This test is not yet approved or cleared by the [place] and  has been authorized for detection and/or diagnosis of SARS-CoV-2 by FDA under an Emergency Use Authorization (EUA).  This EUA will remain in effec t (meaning this test can be used) for the duration of  the COVID-19 declaration under Section 564(b)(1) of the Act, 21 U.S.C. section 360bbb-3(b)(1), unless the authorization is terminated or revoked sooner. Performed at [facility], [place] [address] [place], [place] [address]   Culture, blood (routine x 2)     Status: None (Preliminary result)   Collection Time: 10/06/18  6:31 AM   Specimen: BLOOD  Result Value Ref Range Status   Specimen Description BLOOD RIGHT ANTECUBITAL  Final   Special Requests  Final    BOTTLES DRAWN AEROBIC ONLY Blood Culture adequate volume   Culture   Final    NO GROWTH 2 DAYS Performed at [facility], [place] [address], [place], [place] [address]    Report Status PENDING  Incomplete  Culture, blood (routine x 2)     Status: None (Preliminary result)   Collection Time: 10/06/18  6:31 AM   Specimen: BLOOD  Result Value Ref Range Status   Specimen Description BLOOD LEFT ANTECUBITAL  Final   Special Requests   Final    BOTTLES DRAWN AEROBIC ONLY Blood Culture adequate volume   Culture   Final    NO GROWTH 2 DAYS Performed at [facility], [place] [address], [place], [place] [address]    Report Status PENDING  Incomplete    Impression/Plan: 1. Streptococcus Salivarius Bacteremia - PCN MIC intermediate, will add gentamicin x 2 weeks as outlined below for dual therapy especially considering new TAVR.   2. Native Mitral Valve  Endocarditis = Vegetation to prolapsed mitral valve noted. Dr. Cyndia Bent has seen and would like to have echo arranged at completion of her IV antibiotics. No surgical indication at this time.   3.  Access - please place double lumen PICC line for dual antibiotics and frequent IVF administrations that will require prolonged infusions.   4. Medication Monitoring - will need close kidney function monitoring while on Gentamicin therapy. Requested to delay any travel to [place] while on gentamicin therapy due to extreme concern over toxicity. She seems to understand this and acknowledge she will not travel.    OPAT ORDERS:  Diagnosis: MV native endocarditis   Culture Result: streptococcal salivarius (I-PCN)  Allergies  Allergen Reactions  . Statins Other (See Comments)    Muscle aches    Discharge antibiotics: Per pharmacy protocol Gentamicin x 11 more days with goal trough < 1 Ceftriaxone 2 gm IV Q24h    Duration: Gentamicin - 2 weeks total  Ceftriaxone - 6 weeks total   End Date: Gentamicin - End date 10/21/2018 Ceftriaxone - End date 11/18/18   [facility] Care and Maintenance Per Protocol __ Please pull PIC at completion of IV antibiotics _x_ Please leave PIC in place until doctor has seen patient or been notified  Labs weekly while on IV antibiotics: _x_ CBC with differential __ BMP _x_ BMP TWICE WEEKLY** __ CMP __ CRP __ ESR _x_ Gentamicin troughs  Fax weekly labs to [phone]  Clinic Follow Up Appt: Dr. Baxter Flattery July 1 @ 11:30 am    Janene Madeira, MSN, NP-C [facility] [email or web address] Pager: [phone] Office: [phone] [place]: [phone]

## 2018-10-10 ENCOUNTER — Encounter: Payer: Self-pay | Admitting: *Deleted

## 2018-10-10 LAB — BASIC METABOLIC PANEL
Anion gap: 10 (ref 5–15)
BUN: 7 mg/dL — ABNORMAL LOW (ref 8–23)
CO2: 24 mmol/L (ref 22–32)
Calcium: 9.6 mg/dL (ref 8.9–10.3)
Chloride: 106 mmol/L (ref 98–111)
Creatinine, Ser: 0.73 mg/dL (ref 0.44–1.00)
GFR calc Af Amer: >60 mL/min (ref >60)
GFR calc non Af Amer: >60 mL/min (ref >60)
Glucose, Bld: 97 mg/dL (ref 70–99)
Potassium: 3.3 mmol/L — ABNORMAL LOW (ref 3.5–5.1)
Sodium: 140 mmol/L (ref 135–145)

## 2018-10-10 LAB — CBC
HCT: 29.3 % — ABNORMAL LOW (ref 36.0–46.0)
Hemoglobin: 9.2 g/dL — ABNORMAL LOW (ref 12.0–15.0)
MCH: 19.4 pg — ABNORMAL LOW (ref 26.0–34.0)
MCHC: 31.4 g/dL (ref 30.0–36.0)
MCV: 61.7 fL — ABNORMAL LOW (ref 80.0–100.0)
Platelets: 196 10*3/uL (ref 150–400)
RBC: 4.75 MIL/uL (ref 3.87–5.11)
RDW: 16.7 % — ABNORMAL HIGH (ref 11.5–15.5)
WBC: 7.8 10*3/uL (ref 4.0–10.5)
nRBC: 1.2 % — ABNORMAL HIGH (ref 0.0–0.2)

## 2018-10-10 LAB — PROCALCITONIN: Procalcitonin: 0.21 ng/mL

## 2018-10-10 LAB — GLUCOSE, CAPILLARY
Glucose-Capillary: 88 mg/dL (ref 70–99)
Glucose-Capillary: 172 mg/dL — ABNORMAL HIGH (ref 70–99)

## 2018-10-10 MED ORDER — POTASSIUM CHLORIDE CRYS ER 20 MEQ PO TBCR
40.0000 meq | EXTENDED_RELEASE_TABLET | Freq: Once | ORAL | Status: AC
  Administered 2018-10-10: 40 meq via ORAL
  Filled 2018-10-10: qty 2

## 2018-10-10 MED ORDER — HEPARIN SOD (PORK) LOCK FLUSH 100 UNIT/ML IV SOLN
250.0000 [IU] | INTRAVENOUS | Status: AC | PRN
  Administered 2018-10-10: 250 [IU]

## 2018-10-10 NOTE — Consult Note (Signed)
   [facility] CM Inpatient Consult   10/10/2018  Breanna Webster [birth year] [id number]   Patient evaluated for community based chronic disease management services with [facility] as a benefit of patient's Medicare Insurance. Spoke with patient at bedside phone call to room, HIPAA verified, to explain [facility] services.  Patient will receive post hospital  call and for assessments fo community needs for care/disease management.   Patient states she does not have issues with transportation, medications, or concerns.  Patient is agreeable to post hospital follow up calls with [facility].  She states she has [place] coming and wanted to know the difference.  Differences explained.  Patient assigned to General EMMI follow up calls.   Inpatient TOC Case Manager charted notes were reviewed.    Of note, [facility] Care Management services does not replace or interfere with any services that are arranged by inpatient case management or social work.  For additional questions or referrals please contact:    Natividad Brood, RN BSN [facility] Liaison  [phone] business mobile phone Toll free office [phone]  Fax number: [phone] [email or web address]

## 2018-10-10 NOTE — Progress Notes (Signed)
IV team asked to look at PICC line. Patient states it was leaking earlier. Noted cap to be slightly loose where fluids were infusing. Cap tightened. Both lumens flushed and no leaking noted.

## 2018-10-10 NOTE — Progress Notes (Signed)
10/10/2018 1:45 PM Discharge AVS meds taken today and those due this evening reviewed.  Follow-up appointments and when to call md reviewed.  D/C IV and TELE.  Questions and concerns addressed.   D/C home per orders. [place]

## 2018-10-10 NOTE — Care Management Important Message (Signed)
Important Message  Patient Details  Name: EMEREE MAHLER MRN: [id number] Date of Birth: [birth year]   Medicare Important Message Given:  Yes     [name] 10/10/2018, 12:04 PM

## 2018-10-10 NOTE — Telephone Encounter (Signed)
Letter prepped and released to [place].  Advised husband letter posted.  Husband appreciative for call.

## 2018-10-10 NOTE — Progress Notes (Signed)
Occupational Therapy Treatment Patient Details Name: Breanna Webster MRN: [id number] DOB: 1945/06/23 Today's Date: 10/10/2018    History of present illness 73 y.o. female with medical history significant of TAVR 06/2018, CAD, HTN, DM2.  Patient admitted with c/o SOB, AMS and sepsis with elevated troponin due to demand ischemia   OT comments  This 73 yo female admitted with above presents to acute OT making progress. Needs minguard A when up on her feet without RW (tends to want to furniture walk if she can). Per pt she will have 24 hour s/prn A from husband. Pt is to D/C today if so we have recommended HHOT, if not we will continue to follow next week.  Follow Up Recommendations  Home health OT;Supervision/Assistance - 24 hour    Equipment Recommendations  3 in 1 bedside commode       Precautions / Restrictions Precautions Precautions: Fall Restrictions Weight Bearing Restrictions: No       Mobility Bed Mobility Overal bed mobility: Independent                Transfers Overall transfer level: Needs assistance Equipment used: None Transfers: Sit to/from Stand Sit to Stand: Supervision         General transfer comment: ambulation with minguard A without AD but with gait belt. Pt ambulated 50 feet    Balance Overall balance assessment: Needs assistance Sitting-balance support: No upper extremity supported;Feet supported Sitting balance-Leahy Scale: Good     Standing balance support: No upper extremity supported;During functional activity Standing balance-Leahy Scale: Fair Standing balance comment: standing at sink to brush teeth                           ADL either performed or assessed with clinical judgement   ADL Overall ADL's : Needs assistance/impaired     Grooming: Supervision/safety;Oral care;Standing                   Toilet Transfer: Min guard;Ambulation Toilet Transfer Details (indicate cue type and reason): furniture walking  intermittently                 Vision Patient Visual Report: No change from baseline            Cognition Arousal/Alertness: Awake/alert Behavior During Therapy: WFL for tasks assessed/performed Overall Cognitive Status: Impaired/Different from baseline Area of Impairment: Memory                     Memory: Decreased short-term memory(pt asked me what time she would be leaving today. She could tell me that she had other IV antibiotics to take but did not know anything else. I spoke with RN who said she had told her twice it would probably be around 1:30.)         General Comments: Brushed teeth without any cues for sequencing or finding items at the sink                   Pertinent Vitals/ Pain       Pain Assessment: (where picc line is)         Frequency  Min 2X/week        Progress Toward Goals  OT Goals(current goals can now be found in the care plan section)  Progress towards OT goals: Progressing toward goals     Plan Discharge plan remains appropriate;Frequency remains appropriate       AM-PAC OT "6  Clicks" Daily Activity     Outcome Measure   Help from another person eating meals?: None Help from another person taking care of personal grooming?: A Little Help from another person toileting, which includes using toliet, bedpan, or urinal?: A Little Help from another person bathing (including washing, rinsing, drying)?: A Little Help from another person to put on and taking off regular upper body clothing?: A Little Help from another person to put on and taking off regular lower body clothing?: A Little 6 Click Score: 19    End of Session Equipment Utilized During Treatment: Gait belt  OT Visit Diagnosis: Unsteadiness on feet (R26.81);Other symptoms and signs involving cognitive function   Activity Tolerance Patient tolerated treatment well   Patient Left in chair;with call bell/phone within reach;with chair alarm set   Nurse  Communication (pt asking when she will be D/C'd)        Time: 1123-1140 OT Time Calculation (min): 17 min  Charges: OT General Charges $OT Visit: 1 Visit OT Treatments $Self Care/Home Management : 8-22 mins  [place], OTR/L Acute [employer] Pager [phone] Office [phone]      Almon Register 10/10/2018, 12:01 PM

## 2018-10-10 NOTE — Discharge Summary (Signed)
Discharge Summary  Breanna Webster BSJ:[id number] DOB: Nov 06, 1945  PCP: [name], NP  Admit date: 10/03/2018 Discharge date: 10/10/2018  Time spent: 35 minutes  Recommendations for Outpatient Follow-up:  1. Follow up with Infectious Disease October 22, 2018 at 1130 AM 2. Follow up with your cardiologist 3. Follow-up with your PCP 4. Take your medications as prescribed 5. Continue physical therapy. 6. Continue with lab CBC with differential weekly and CMP twice weekly.  Recommendations per infectious disease: Discharge antibiotics: Per pharmacy protocol Gentamicin x 11 more days with goal trough < 1 Ceftriaxone 2 gm IV Q24h    Duration: Gentamicin - 2 weeks total  Ceftriaxone - 6 weeks total   End Date: Gentamicin - End date 10/21/2018 Ceftriaxone - End date 11/18/18   [facility] Care and Maintenance Per Protocol __ Please pull PIC at completion of IV antibiotics _x_ Please leave PIC in place until doctor has seen patient or been notified  Labs weekly while on IV antibiotics: _x_ CBC with differential __ BMP _x_ BMP TWICE WEEKLY** __ CMP __ CRP __ ESR _x_ Gentamicin troughs  Fax weekly labs to [phone]  Clinic Follow Up Appt: Dr. Baxter Flattery July 1 @ 11:30 am    "Medication Monitoring - will need close kidney function monitoring while on Gentamicin therapy. Requested to delay any travel to [place] while on gentamicin therapy due to extreme concern over toxicity. She seems to understand this and acknowledge she will not travel" per ID.     Discharge Diagnoses:  Active Hospital Problems   Diagnosis Date Noted   Bacteremia due to Streptococcus Salivarius 10/07/2018   Endocarditis of mitral valve 10/07/2018   Sepsis ([name]) 65/68/1275   Acute metabolic encephalopathy 17/00/1749   S/P TAVR (transcatheter aortic valve replacement) 07/08/2018   CAD in native artery 04/22/2018   Pulmonary hypertension ([place]) 02/21/2018   Essential hypertension 07/15/2017    Diabetes mellitus due to underlying condition, uncontrolled ([place]) 06/30/2010    Resolved Hospital Problems  No resolved problems to display.    Discharge Condition: Stable   Diet recommendation: Resume previous diet   Vitals:   10/09/18 2053 10/10/18 0443  BP: (!) 161/73 (!) 178/87  Pulse:  70  Resp:    Temp: 97.7 F (36.5 C) 97.6 F (36.4 C)  SpO2: 100% 93%    History of present illness:  73 year old female with history of AS s/p TAVR in 3/20, CAD s/p stent to RCA in 03/2018, HTN, PHTN, DM-2, hypothyroidism, LBBB and GERD admitted with acute shortness of breath, fever and altered mental status. Reportedly oriented to person and place only on admission. In the ED, hemodynamically stable except for RR to 31. Oxygen saturation 93% on 2 L. Leukocytosis to 16,000. Lactate of 4.1. Elevated to 0.98. CXR, CT head, CTA chest/abdomen/pelvis without acute significant finding. COVID-19 negative. Urinalysis negative. Received 4 L of IV fluid bolus and lactate trended down to 2.4. Blood cultures drawn. Started on broad-spectrum antibiotics including acyclovir. Cardiology consulted. Patient was admitted for undifferentiated sepsis. Blood culture grew GPC/Streptococcus species in 3 out of 4. Antibiotic de-escalated to ceftriaxone . Doxycycline discontinued.  TEE 6/1 with evidence of infective endocarditis, no obvious TAVR valve infection..  On IV gentamicin and Rocephin for strep salvarium bacteremia per ID.  10/10/18: Patient seen and examined at her bedside.  No acute events overnight.  Vital signs and lab results reviewed and are stable.  She had mild decrease in her potassium this morning which was repleted.  All questions answered to her  satisfaction.  On the day of discharge, the patient was hemodynamically stable.  She will need to follow-up with her infectious disease, her cardiologist, and her primary care physician for hospitalization.  She will also need to continue physical  therapy and avoid falls.  Home health services has been arranged to assist with IV antibiotics infusion.    Hospital Course:  Principal Problem:   Bacteremia due to Streptococcus Salivarius Active Problems:   Diabetes mellitus due to underlying condition, uncontrolled ([place])   Essential hypertension   Pulmonary hypertension ([facility])   CAD in native artery   S/P TAVR (transcatheter aortic valve replacement)   Sepsis ([place])   Acute metabolic encephalopathy   Endocarditis of mitral valve  Strep salivarus bacteremia with infective endocarditis Blood culture on 10/03/18, positive for Streptococcus salivarius Underwent TEE 10/07/18 that shows IE butno obvious TAVR valve infection. Infectious disease followed Continue gentamicin and Rocephin as recommended by infectious disease.   Repeat blood cultures x2 done on 10/06/2018- x3 day PICC line placed on 10/07/2018 Afebrile with no leukocytosis Per cardiology repeat 2D echo after completion of course of antibiotics. Orthopantogram no evidence of dental infection.  Resolved acute metabolic encephalopathy likely secondary to bacteremia Resolved Continue IV antibiotics as indicated CT head unremarkable for any acute intracranial findings   Elevated troponin likely secondary to demand ischemia from infection/bacteremia Troponin peaked at 0.98 and trended down Cardiology was consulted and followed No work-up recommended per cardiology  Aortic stenosis status post TAVR No evidence of TAVR valve infection per TEE  CAD s/p RCA stent in 03/2018: Denies chest pain Continue cardiac medications On Plavix, aspirin, bisoprolol  Patient is intolerant of statin.    Hypokalemia Sodium 3.3 Repleted with potassium p.o. 40 mEq once  Hypertension: BP is normotensive.   On bisoprolol , losartan, HCTZ, continue  Resolved intermittent bradycardia, likely iatrogenic from beta-blocker On bisoprolol Follow-up with your cardiologist  Poorly  controlled IDDM-2 with hyperglycemia: A1c 8.5%. On NPH 120 units daily at home:Cutdown dose and NPH to avoid hypoglycemia.  \ Follow-up with your PCP  Iron deficiency anemia/anemia of chronic disease:Hemoglobin 9.1 from 8.8 yesterday.   Low MCV 62.  Iron study with evidence of iron deficiency.    Continue ferrous sulfate 325 mg twice daily.  Resolved hyponatremia:  Hypothyroidism: Continue Synthroid  Depression:Clinically stable on Lexapro and nortriptyline.  GERD:on PPI  Loose stools: Suspect related to IV antibiotics Afebrile with no leukocytosis no sign of infective process Over-the-counter probiotic as needed Follow-up with your PCP    Code Status:Full code  Consultants:  Cardiology  Infectious disease  Pharmacy  Procedures:  TEE 10/07/18  Microbiology:  COVID-19 negative  Blood culture-Streptococcus species  Antimicrobials:  -Doxycycline 5/13-5/14.  -Ceftriaxone 5/14>> -Gentamicin  -Acyclovir discontinued on 6/13.    Discharge Exam: BP (!) 178/87 (BP Location: Right Arm)    Pulse 70    Temp 97.6 F (36.4 C) (Oral)    Resp 18    Ht 5' 5"  (1.651 m)    Wt 92.4 kg    SpO2 93%    BMI 33.90 kg/m   General: 73 y.o. year-old female well-developed well-nourished in no acute distress.  Alert and oriented x3.    Cardiovascular: Regular rate and rhythm with no rubs or gallops.  No JVD or thyromegaly noted.  Respiratory: Clear to auscultation with no wheezes or rales.  Good inspiratory effort.  Abdomen: Obese, bowel sounds present.    Musculoskeletal: No lower extremity edema.  2 out of 4 pulses in  all 4 extremities.  Psychiatry: Mood is appropriate for condition and setting.  Discharge Instructions You were cared for by a hospitalist during your hospital stay. If you have any questions about your discharge medications or the care you received while you were in the hospital after you are discharged, you can call the unit and asked to  speak with the hospitalist on call if the hospitalist that took care of you is not available. Once you are discharged, your primary care physician will handle any further medical issues. Please note that NO REFILLS for any discharge medications will be authorized once you are discharged, as it is imperative that you return to your primary care physician (or establish a relationship with a primary care physician if you do not have one) for your aftercare needs so that they can reassess your need for medications and monitor your lab values.  Discharge Instructions    Home infusion instructions [facility] May follow [place] Dosing Protocol; May administer Cathflo as needed to maintain patency of vascular access device.; Flushing of vascular access device: per [facility] Protocol: 0.9% NaCl pre/post medica...   Complete by: As directed    Instructions: May follow [place] Dosing Protocol   Instructions: May administer Cathflo as needed to maintain patency of vascular access device.   Instructions: Flushing of vascular access device: per [facility] Protocol: 0.9% NaCl pre/post medication administration and prn patency; Heparin 100 u/ml, 24m for implanted ports and Heparin 10u/ml, 510mfor all other central venous catheters.   Instructions: May follow [facility] Anaphylaxis Protocol for First Dose Administration in the home: 0.9% NaCl at 25-50 ml/hr to maintain IV access for protocol meds. Epinephrine 0.3 ml IV/IM PRN and Benadryl 25-50 IV/IM PRN s/s of anaphylaxis.   Instructions: [facility] Coordinator (RN) to assist per patient IV care needs in the home PRN.     Allergies as of 10/10/2018      Reactions   Statins Other (See Comments)   Muscle aches      Medication List    TAKE these medications   acetaminophen 500 MG tablet Commonly known as: TYLENOL Take 1,000 mg by mouth every 6 (six) hours as needed (for pain.).   aspirin 81 MG tablet Take 81 mg by mouth daily.   bisoprolol 5  MG tablet Commonly known as: ZEBETA TAKE 1 TABLET BY MOUTH EVERY DAY   cefTRIAXone  IVPB Commonly known as: ROCEPHIN Inject 2 g into the vein daily. Indication:  Strep bacteremia with endocarditis Last Day of Therapy:  11/18/18 Labs - Once weekly:  CBC/D and BMP, Labs - Every other week:  ESR and CRP   Chlor-Trimeton 4 MG tablet Generic drug: chlorpheniramine Take 4 mg by mouth at bedtime.   cholecalciferol 1000 units tablet Commonly known as: VITAMIN D Take 1,000 Units by mouth daily.   clopidogrel 75 MG tablet Commonly known as: PLAVIX Take 1 tablet (75 mg total) by mouth daily with breakfast.   escitalopram 20 MG tablet Commonly known as: LEXAPRO TAKE 1 TABLET BY MOUTH EVERY DAY   ferrous sulfate 325 (65 FE) MG tablet Take 1 tablet (325 mg total) by mouth 2 (two) times daily with a meal.   gentamicin  IVPB Commonly known as: GARAMYCIN Inject 80 mg into the vein daily for 12 days. Indication:  Strep bacteremia with endocarditis Last Day of Therapy:  10/21/18 Labs - Sunday/Monday:  CBC/D, BMP, and gentamicin trough. Labs - Thursday:  BMP and gentamicin trough Labs -  Every other week:  ESR and CRP   hydrochlorothiazide 12.5 MG tablet Commonly known as: HYDRODIURIL TAKE 1 TABLET BY MOUTH EVERY DAY   insulin NPH Human 100 UNIT/ML injection Commonly known as: NovoLIN N ReliOn Inject 0.4 mLs (40 Units total) into the skin every morning. And syringes 1/day What changed: how much to take   Insulin Syringe-Needle U-100 31G X 5/16" 1 ML Misc Commonly known as: BD Insulin Syringe U/F USE TO INJECT INSULIN ONCE DAILY   losartan 50 MG tablet Commonly known as: COZAAR TAKE 1 TABLET(50 MG) BY MOUTH DAILY What changed: See the new instructions.   multivitamin with minerals Tabs tablet Take 1 tablet by mouth daily.   nitroGLYCERIN 0.4 MG SL tablet Commonly known as: Nitrostat Place 1 tablet (0.4 mg total) under the tongue every 5 (five) minutes as needed for chest pain.     nortriptyline 25 MG capsule Commonly known as: PAMELOR TAKE 1 CAPSULE(25 MG) BY MOUTH AT BEDTIME What changed: See the new instructions.   OneTouch Delica Lancets 29B Misc USE TO CHECK BLOOD SUGAR TWICE DAILY What changed: See the new instructions.   OneTouch Verio test strip Generic drug: glucose blood USE TO CHECK BLOOD SUGAR TWICE DAILY What changed: See the new instructions.   pantoprazole 40 MG tablet Commonly known as: PROTONIX Take 1 tablet (40 mg total) by mouth 2 (two) times daily.   sodium chloride 0.9 % Inject 1,000 mLs into the vein daily for 12 days. Infuse over four hours after gentamicin infusion.   Synthroid 175 MCG tablet Generic drug: levothyroxine TAKE 1 TABLET BY MOUTH EVERY DAY            Home Infusion Instuctions  (From admission, onward)         Start     Ordered   10/09/18 0000  Home infusion instructions [facility] May follow [place] Dosing Protocol; May administer Cathflo as needed to maintain patency of vascular access device.; Flushing of vascular access device: per [facility] Protocol: 0.9% NaCl pre/post medica...    Question Answer Comment  Instructions May follow [place] Dosing Protocol   Instructions May administer Cathflo as needed to maintain patency of vascular access device.   Instructions Flushing of vascular access device: per [facility] Protocol: 0.9% NaCl pre/post medication administration and prn patency; Heparin 100 u/ml, 79m for implanted ports and Heparin 10u/ml, 542mfor all other central venous catheters.   Instructions May follow [facility] Anaphylaxis Protocol for First Dose Administration in the home: 0.9% NaCl at 25-50 ml/hr to maintain IV access for protocol meds. Epinephrine 0.3 ml IV/IM PRN and Benadryl 25-50 IV/IM PRN s/s of anaphylaxis.   Instructions [facility] Infusion Coordinator (RN) to assist per patient IV care needs in the home PRN.      10/09/18 1420           Durable Medical Equipment  (From  admission, onward)         Start     Ordered   10/09/18 0600  For home use only DME Bedside commode  Once    Comments: 3 n 1  Question:  Patient needs a bedside commode to treat with the following condition  Answer:  Ambulatory dysfunction   10/09/18 0600         Allergies  Allergen Reactions   Statins Other (See Comments)    Muscle aches   Follow-up Information    [facility]-Home Follow up.   Specialty: [place]  Why: HHRN/PT/OT arranged- they will contact you for home visits ([facility] will follow along for home IV abx needs)       [name], NP. Call in 1 day(s).   Specialty: Family Medicine Why: Please call for a post hospital follow-up appointment. Contact information: [place] [address] [phone]        [name], MD .   Specialty: Cardiology Contact information: [phone] [address] [place] [address] [phone]        Carlyle Basques, MD. Call in 1 day(s).   Specialty: Infectious Diseases Why: Please call for a post hospital follow-up appointment. Contact information: [place] [address] [phone]            The results of significant diagnostics from this hospitalization (including imaging, microbiology, ancillary and laboratory) are listed below for reference.    Significant Diagnostic Studies: Ct Abdomen Pelvis Wo Contrast  Result Date: 10/04/2018 CLINICAL DATA:  Sepsis EXAM: CT ABDOMEN AND PELVIS WITHOUT CONTRAST TECHNIQUE: Multidetector CT imaging of the abdomen and pelvis was performed following the standard protocol without IV contrast. COMPARISON:  06/17/2018 FINDINGS: Lower chest: No acute abnormality. Hepatobiliary: There is hepatic steatosis. The liver appears nodular in contour. The patient is status post prior cholecystectomy. Pancreas: The pancreas is atrophic and fatty replaced. Spleen: The spleen is enlarged  measuring approximately 14 cm craniocaudad. Adrenals/Urinary Tract: Adrenal glands are unremarkable. Kidneys are normal, without renal calculi, focal lesion, or hydronephrosis. Bladder is unremarkable. Detection of stones is limited by the presence of IV contrast from prior contrast enhanced CT of the chest. Stomach/Bowel: There is sigmoid diverticulosis without CT evidence of diverticulitis. The appendix is unremarkable. The stomach is unremarkable. There is no evidence of a small-bowel obstruction. Vascular/Lymphatic: Aortic atherosclerosis. No enlarged abdominal or pelvic lymph nodes. Reproductive: Uterus and bilateral adnexa are unremarkable. Other: No abdominal wall hernia or abnormality. No abdominopelvic ascites. Musculoskeletal: No acute or significant osseous findings. IMPRESSION: 1. No acute intra-abdominal abnormality detected. No findings to explain the patient's sepsis. 2. Cirrhotic appearing liver with stigmata of portal hypertension including splenomegaly. 3. Status post cholecystectomy. 4. Normal appendix in the right lower quadrant. 5. No hydronephrosis. 6.  Choose 1 Electronically Signed   By: Constance Holster M.D.   On: 10/04/2018 03:03   Dg Orthopantogram  Result Date: 10/07/2018 CLINICAL DATA:  Mitral valve endocarditis. EXAM: ORTHOPANTOGRAM/PANORAMIC COMPARISON:  None. FINDINGS: Multiple dental implants and amalgam are noted. No dental caries or periapical lucencies. IMPRESSION: No evidence of tooth infection. Electronically Signed   By: Titus Dubin M.D.   On: 10/07/2018 19:43   Ct Head Wo Contrast  Result Date: 10/04/2018 CLINICAL DATA:  Encephalopathy EXAM: CT HEAD WITHOUT CONTRAST TECHNIQUE: Contiguous axial images were obtained from the base of the skull through the vertex without intravenous contrast. COMPARISON:  None. FINDINGS: Brain: Mild age related volume loss. No acute intracranial abnormality. Specifically, no hemorrhage, hydrocephalus, mass lesion, acute infarction,  or significant intracranial injury. Vascular: No hyperdense vessel or unexpected calcification. Skull: No acute calvarial abnormality. Sinuses/Orbits: Visualized paranasal sinuses and mastoids clear. Orbital soft tissues unremarkable. Other: None IMPRESSION: No acute intracranial abnormality. Electronically Signed   By: Rolm Baptise M.D.   On: 10/04/2018 03:51   Ct Angio Chest Pe W And/or Wo Contrast  Result Date: 10/04/2018 CLINICAL DATA:  Shortness of breath EXAM: CT ANGIOGRAPHY CHEST WITH CONTRAST TECHNIQUE: Multidetector CT imaging of the chest was performed using the standard protocol  during bolus administration of intravenous contrast. Multiplanar CT image reconstructions and MIPs were obtained to evaluate the vascular anatomy. CONTRAST:  19m OMNIPAQUE IOHEXOL 350 MG/ML SOLN COMPARISON:  CT chest dated 06/18/2018 FINDINGS: Cardiovascular: The heart size is significantly enlarged. The patient is status post TAVR. Mitral valve calcifications are noted. Coronary artery calcifications are noted. Thoracic aortic calcifications are noted. Mediastinum/Nodes: The patient appears to be status post prior thyroidectomy. There are no pathologically enlarged mediastinal or hilar lymph nodes. No enlarged axillary or supraclavicular lymph nodes. Lungs/Pleura: The lung volumes are low. There is no large pleural effusion. No pneumothorax. The trachea is unremarkable. There is presumed atelectasis at the lung bases bilaterally, left worse than right. Upper Abdomen: The liver appears enlarged. There is likely underlying hepatic steatosis. The patient is status post prior cholecystectomy. The spleen is enlarged. Musculoskeletal: No chest wall abnormality. No acute or significant osseous findings. Review of the MIP images confirms the above findings. IMPRESSION: 1. No PE. 2. Cardiomegaly.  The patient is status post TAVR. 3. Low lung volumes. There is no pneumothorax or significant pleural effusion. 4. Probable hepatic  steatosis and hepatomegaly. 5. Splenomegaly. Aortic Atherosclerosis (ICD10-I70.0). Electronically Signed   By: CConstance HolsterM.D.   On: 10/04/2018 00:16   Dg Chest Portable 1 View  Result Date: 10/03/2018 CLINICAL DATA:  Shortness of breath EXAM: PORTABLE CHEST 1 VIEW COMPARISON:  07/08/2018 FINDINGS: The heart is enlarged. The patient is status post prior TAVR. There is no pneumothorax. No large pleural effusion. No significant area of consolidation. There is no pneumothorax. No acute osseous abnormality. IMPRESSION: No active disease. Electronically Signed   By: CConstance HolsterM.D.   On: 10/03/2018 22:21   [place]dom[place] Ru[place] W/elastography  Result Date: 09/30/2018 CLINICAL DATA:  Evaluate for cirrhosis.  Increased LFTs. EXAM: [place] LIMITED - RIGHT UPPER QUADRANT ULTRASOUND HEPATIC ELASTOGRAPHY TECHNIQUE: Limited right upper quadrant abdominal ultrasound was performed. In addition, ultrasound elastography evaluation of the liver was performed. A region of interest was placed in the right lobe of the liver. Following application of a compressive sonographic pulse, shear waves were detected in the adjacent hepatic tissue and the shear wave velocity was calculated. Multiple assessments were performed at the selected site. Median shear wave velocity is correlated to a Metavir fibrosis score. COMPARISON:  CT AP 06/17/2018 FINDINGS: ULTRASOUND ABDOMEN LIMITED RIGHT UPPER QUADRANT Gallbladder: Status post cholecystectomy. Common bile duct: Diameter: 6 mm Liver: Diffuse coarsened echotexture noted. No mass identified. Portal vein is patent on color Doppler imaging with normal direction of blood flow towards the liver. ULTRASOUND HEPATIC ELASTOGRAPHY Device: Siemens Helix VTQ Patient position: Oblique Transducer 6C1 Number of measurements: 10 Hepatic segment:  8 Median velocity:   1.4 m/sec IQR: 0.3 IQR/Median velocity ratio: 0.21 Corresponding Metavir fibrosis score:  F2 + some F3 Risk of fibrosis:  Moderate Limitations of exam: Coughing and shortness of breath Please note that abnormal shear wave velocities may also be identified in clinical settings other than with hepatic fibrosis, such as: acute hepatitis, elevated right heart and central venous pressures including use of beta blockers, veno-occlusive disease (Budd-Chiari), infiltrative processes such as mastocytosis/amyloidosis/infiltrative tumor, extrahepatic cholestasis, in the post-prandial state, and liver transplantation. Correlation with patient history, laboratory data, and clinical condition recommended. IMPRESSION: ULTRASOUND ABDOMEN: 1. Coarsened echotexture of the liver compatible with hepatic steatosis. 2. Status post cholecystectomy. ULTRASOUND HEPATIC ELASTOGRAHY: Median hepatic shear wave velocity is calculated at 1.4 m/sec. Corresponding Metavir fibrosis score is F2 + some F3. Risk of fibrosis  is High. Follow-up: Additional testing appropriate Electronically Signed   By: Kerby Moors M.D.   On: 09/30/2018 11:47   [name] Ekg Site Rite  Result Date: 10/09/2018 If Site Rite image not attached, placement could not be confirmed due to current cardiac rhythm.   Microbiology: Recent Results (from the past 240 hour(s))  Culture, blood (Routine x 2)     Status: Abnormal   Collection Time: 10/03/18  9:30 PM   Specimen: BLOOD RIGHT HAND  Result Value Ref Range Status   Specimen Description BLOOD RIGHT HAND  Final   Special Requests   Final    BOTTLES DRAWN AEROBIC AND ANAEROBIC Blood Culture results may not be optimal due to an excessive volume of blood received in culture bottles   Culture  Setup Time   Final    GRAM POSITIVE COCCI IN CHAINS IN BOTH AEROBIC AND ANAEROBIC BOTTLES CRITICAL VALUE NOTED.  VALUE IS CONSISTENT WITH PREVIOUSLY REPORTED AND CALLED VALUE.    Culture (A)  Final    STREPTOCOCCUS SALIVARIUS SUSCEPTIBILITIES PERFORMED ON PREVIOUS CULTURE WITHIN THE LAST 5 DAYS. Performed at [facility], [place]  [address] [place], [place] [address]    Report Status 10/07/2018 FINAL  Final  Culture, blood (Routine x 2)     Status: Abnormal   Collection Time: 10/03/18  9:40 PM   Specimen: BLOOD LEFT HAND  Result Value Ref Range Status   Specimen Description BLOOD LEFT HAND  Final   Special Requests   Final    BOTTLES DRAWN AEROBIC ONLY Blood Culture results may not be optimal due to an excessive volume of blood received in culture bottles   Culture  Setup Time   Final    GRAM POSITIVE COCCI IN CHAINS AEROBIC BOTTLE ONLY CRITICAL RESULT CALLED TO, READ BACK BY AND VERIFIED WITH: RHRMD L SEAY @2211  10/04/18 BY S GEZAHEGN Performed at [facility], [place] [address], [place], [place] [address]    Culture STREPTOCOCCUS SALIVARIUS (A)  Final   Report Status 10/07/2018 FINAL  Final   Organism ID, Bacteria STREPTOCOCCUS SALIVARIUS  Final      Susceptibility   Streptococcus salivarius - MIC*    PENICILLIN 0.5 INTERMEDIATE Intermediate     CEFTRIAXONE 0.25 SENSITIVE Sensitive     ERYTHROMYCIN 2 RESISTANT Resistant     LEVOFLOXACIN 0.5 SENSITIVE Sensitive     VANCOMYCIN 0.5 SENSITIVE Sensitive     * STREPTOCOCCUS SALIVARIUS  Blood Culture ID Panel (Reflexed)     Status: Abnormal   Collection Time: 10/03/18  9:40 PM  Result Value Ref Range Status   Enterococcus species NOT DETECTED NOT DETECTED Final   Listeria monocytogenes NOT DETECTED NOT DETECTED Final   Staphylococcus species NOT DETECTED NOT DETECTED Final   Staphylococcus aureus (BCID) NOT DETECTED NOT DETECTED Final   Streptococcus species DETECTED (A) NOT DETECTED Final    Comment: Not Enterococcus species, Streptococcus agalactiae, Streptococcus pyogenes, or Streptococcus pneumoniae. CRITICAL RESULT CALLED TO, READ BACK BY AND VERIFIED WITH: PHRMD L SEAY @2211  10/04/18 BY S GEZAHEGN    Streptococcus agalactiae NOT DETECTED NOT DETECTED Final   Streptococcus pneumoniae NOT DETECTED NOT DETECTED Final   Streptococcus pyogenes NOT DETECTED NOT  DETECTED Final   Acinetobacter baumannii NOT DETECTED NOT DETECTED Final   Enterobacteriaceae species NOT DETECTED NOT DETECTED Final   Enterobacter cloacae complex NOT DETECTED NOT DETECTED Final   Escherichia coli NOT DETECTED NOT DETECTED Final   Klebsiella oxytoca NOT DETECTED NOT DETECTED Final  Klebsiella pneumoniae NOT DETECTED NOT DETECTED Final   Proteus species NOT DETECTED NOT DETECTED Final   Serratia marcescens NOT DETECTED NOT DETECTED Final   Haemophilus influenzae NOT DETECTED NOT DETECTED Final   Neisseria meningitidis NOT DETECTED NOT DETECTED Final   Pseudomonas aeruginosa NOT DETECTED NOT DETECTED Final   Candida albicans NOT DETECTED NOT DETECTED Final   Candida glabrata NOT DETECTED NOT DETECTED Final   Candida krusei NOT DETECTED NOT DETECTED Final   Candida parapsilosis NOT DETECTED NOT DETECTED Final   Candida tropicalis NOT DETECTED NOT DETECTED Final    Comment: Performed at [facility], [place] [address], [place], [place] [address]  SARS Coronavirus 2     Status: None   Collection Time: 10/03/18 10:28 PM  Result Value Ref Range Status   SARS Coronavirus 2 NOT DETECTED NOT DETECTED Final    Comment: (NOTE) SARS-CoV-2 target nucleic acids are NOT DETECTED. The SARS-CoV-2 RNA is generally detectable in upper and lower respiratory specimens during the acute phase of infection.  Negative  results do not preclude SARS-CoV-2 infection, do not rule out co-infections with other pathogens, and should not be used as the sole basis for treatment or other patient management decisions.  Negative results must be combined with clinical observations, patient history, and epidemiological information. The expected result is Not Detected. Fact Sheet for Patients: http://www.biofiredefense.com/wp-content/uploads/2020/03/BIOFIRE-COVID -19-patients.pdf Fact Sheet for Healthcare  Providers: http://www.biofiredefense.com/wp-content/uploads/2020/03/BIOFIRE-COVID -19-hcp.pdf This test is not yet approved or cleared by the [place] and  has been authorized for detection and/or diagnosis of SARS-CoV-2 by FDA under an Emergency Use Authorization (EUA).  This EUA will remain in effec t (meaning this test can be used) for the duration of  the COVID-19 declaration under Section 564(b)(1) of the Act, 21 U.S.C. section 360bbb-3(b)(1), unless the authorization is terminated or revoked sooner. Performed at [facility], [place] [address], [place], [place] [address]   Culture, blood (routine x 2)     Status: None (Preliminary result)   Collection Time: 10/06/18  6:31 AM   Specimen: BLOOD  Result Value Ref Range Status   Specimen Description BLOOD RIGHT ANTECUBITAL  Final   Special Requests   Final    BOTTLES DRAWN AEROBIC ONLY Blood Culture adequate volume   Culture   Final    NO GROWTH 4 DAYS Performed at [facility], [place] [address], [place], [place] [address]    Report Status PENDING  Incomplete  Culture, blood (routine x 2)     Status: None (Preliminary result)   Collection Time: 10/06/18  6:31 AM   Specimen: BLOOD  Result Value Ref Range Status   Specimen Description BLOOD LEFT ANTECUBITAL  Final   Special Requests   Final    BOTTLES DRAWN AEROBIC ONLY Blood Culture adequate volume   Culture   Final    NO GROWTH 4 DAYS Performed at [facility], [place] [address], [place], [place] [address]    Report Status PENDING  Incomplete     Labs: Basic Metabolic Panel: Recent Labs  Lab 10/04/18 0750 10/05/18 0433 10/06/18 0631 10/07/18 0412 10/08/18 0524 10/09/18 0331 10/10/18 0328  NA 135 138 138  --   --  139 140  K 3.6 3.3* 3.5  --   --  4.1 3.3*  CL 105 108 107  --   --  107 106  CO2 20* 21* 23  --   --  23 24  GLUCOSE 147* 110* 115*  --   --  91 97  BUN 21 11 6*  --   --  6* 7*  CREATININE 0.91 0.74 0.69  --   --  0.64 0.73   CALCIUM 8.4* 8.9 9.4  --   --  9.6 9.6  MG  --  2.0 1.9 1.7 1.8 1.7  --    Liver Function Tests: Recent Labs  Lab 10/03/18 2136 10/04/18 0750 10/05/18 0433  AST 46* 38 34  ALT 41 30 26  ALKPHOS 85 78 66  BILITOT 1.9* 1.5* 0.9  PROT 7.3 6.1* 6.0*  ALBUMIN 3.1* 2.7* 2.6*   No results for input(s): LIPASE, AMYLASE in the last 168 hours. No results for input(s): AMMONIA in the last 168 hours. CBC: Recent Labs  Lab 10/03/18 2136  10/06/18 0631 10/07/18 0412 10/08/18 0524 10/09/18 0331 10/10/18 0328  WBC 16.2*   < > 7.3 6.8 8.6 9.1 7.8  NEUTROABS 14.6*  --   --   --   --   --   --   HGB 10.6*   < > 9.0* 8.8* 9.1* 9.8* 9.2*  HCT 33.9*   < > 29.2* 28.6* 29.8* 31.9* 29.3*  MCV 61.4*   < > 62.1* 61.4* 62.3* 62.2* 61.7*  PLT 208   < > 175 177 198 202 196   < > = values in this interval not displayed.   Cardiac Enzymes: Recent Labs  Lab 10/03/18 2136 10/04/18 0100 10/04/18 0440  TROPONINI 0.98* 0.91* 0.83*   BNP: BNP (last 3 results) Recent Labs    07/07/18 1630  BNP 112.2*    ProBNP (last 3 results) Recent Labs    02/19/18 1233  PROBNP 270    CBG: Recent Labs  Lab 10/09/18 0702 10/09/18 1109 10/09/18 1649 10/09/18 2051 10/10/18 0620  GLUCAP 96 181* 150* 147* 88       Signed:  Kayleen Memos, MD [facility] 10/10/2018, 9:09 AM

## 2018-10-11 DIAGNOSIS — E1165 Type 2 diabetes mellitus with hyperglycemia: Secondary | ICD-10-CM | POA: Diagnosis not present

## 2018-10-11 DIAGNOSIS — Z792 Long term (current) use of antibiotics: Secondary | ICD-10-CM | POA: Diagnosis not present

## 2018-10-11 DIAGNOSIS — I10 Essential (primary) hypertension: Secondary | ICD-10-CM | POA: Diagnosis not present

## 2018-10-11 DIAGNOSIS — B955 Unspecified streptococcus as the cause of diseases classified elsewhere: Secondary | ICD-10-CM | POA: Diagnosis not present

## 2018-10-11 DIAGNOSIS — Z452 Encounter for adjustment and management of vascular access device: Secondary | ICD-10-CM | POA: Diagnosis not present

## 2018-10-11 DIAGNOSIS — D509 Iron deficiency anemia, unspecified: Secondary | ICD-10-CM | POA: Diagnosis not present

## 2018-10-11 DIAGNOSIS — Z7982 Long term (current) use of aspirin: Secondary | ICD-10-CM | POA: Diagnosis not present

## 2018-10-11 DIAGNOSIS — R7881 Bacteremia: Secondary | ICD-10-CM | POA: Diagnosis not present

## 2018-10-11 DIAGNOSIS — I2510 Atherosclerotic heart disease of native coronary artery without angina pectoris: Secondary | ICD-10-CM | POA: Diagnosis not present

## 2018-10-11 DIAGNOSIS — I330 Acute and subacute infective endocarditis: Secondary | ICD-10-CM | POA: Diagnosis not present

## 2018-10-11 DIAGNOSIS — Z952 Presence of prosthetic heart valve: Secondary | ICD-10-CM | POA: Diagnosis not present

## 2018-10-11 LAB — CULTURE, BLOOD (ROUTINE X 2)
Culture: NO GROWTH
Culture: NO GROWTH
Special Requests: ADEQUATE
Special Requests: ADEQUATE

## 2018-10-12 DIAGNOSIS — I330 Acute and subacute infective endocarditis: Secondary | ICD-10-CM | POA: Diagnosis not present

## 2018-10-12 DIAGNOSIS — R7881 Bacteremia: Secondary | ICD-10-CM | POA: Diagnosis not present

## 2018-10-12 DIAGNOSIS — B955 Unspecified streptococcus as the cause of diseases classified elsewhere: Secondary | ICD-10-CM | POA: Diagnosis not present

## 2018-10-12 DIAGNOSIS — I2510 Atherosclerotic heart disease of native coronary artery without angina pectoris: Secondary | ICD-10-CM | POA: Diagnosis not present

## 2018-10-12 DIAGNOSIS — E1165 Type 2 diabetes mellitus with hyperglycemia: Secondary | ICD-10-CM | POA: Diagnosis not present

## 2018-10-12 DIAGNOSIS — Z452 Encounter for adjustment and management of vascular access device: Secondary | ICD-10-CM | POA: Diagnosis not present

## 2018-10-13 ENCOUNTER — Telehealth: Payer: Self-pay | Admitting: *Deleted

## 2018-10-13 ENCOUNTER — Encounter: Payer: Self-pay | Admitting: Thoracic Surgery (Cardiothoracic Vascular Surgery)

## 2018-10-13 DIAGNOSIS — Z452 Encounter for adjustment and management of vascular access device: Secondary | ICD-10-CM | POA: Diagnosis not present

## 2018-10-13 DIAGNOSIS — B955 Unspecified streptococcus as the cause of diseases classified elsewhere: Secondary | ICD-10-CM | POA: Diagnosis not present

## 2018-10-13 DIAGNOSIS — I2510 Atherosclerotic heart disease of native coronary artery without angina pectoris: Secondary | ICD-10-CM | POA: Diagnosis not present

## 2018-10-13 DIAGNOSIS — R7881 Bacteremia: Secondary | ICD-10-CM | POA: Diagnosis not present

## 2018-10-13 DIAGNOSIS — I330 Acute and subacute infective endocarditis: Secondary | ICD-10-CM | POA: Diagnosis not present

## 2018-10-13 DIAGNOSIS — E1165 Type 2 diabetes mellitus with hyperglycemia: Secondary | ICD-10-CM | POA: Diagnosis not present

## 2018-10-13 NOTE — Telephone Encounter (Signed)
Transition Care Management Follow-up Telephone Call   Date discharged? 10/10/2018   How have you been since you were released from the hospital? I've been resting    Do you understand why you were in the hospital? Yes- infection in valve and valve not closing properly (now replaced)    Do you understand the discharge instructions? yes  Where were you discharged to? Home   Items Reviewed:  Medications reviewed: yes  Allergies reviewed: yes  Dietary changes reviewed: no  Referrals reviewed: yes   Functional Questionnaire:   Activities of Daily Living (ADLs):   She states they are independent in the following: ambulation, bathing and hygiene, feeding, continence, grooming, toileting and dressing States they require assistance with the following: N/A    Any transportation issues/concerns?: no   Any patient concerns? Yes wants Tommi Rumps to interpret CT scan in regards to liver    Confirmed importance and date/time of follow-up visits scheduled yes  Provider Appointment booked with [name], NP for 10/14/2018 at 2:30 PM (virtually)  Confirmed with patient if condition begins to worsen call PCP or go to the ER.  Patient was given the office number and encouraged to call back with question or concerns.  : yes

## 2018-10-14 ENCOUNTER — Other Ambulatory Visit: Payer: Self-pay

## 2018-10-14 ENCOUNTER — Telehealth: Payer: Self-pay | Admitting: Interventional Cardiology

## 2018-10-14 ENCOUNTER — Ambulatory Visit (INDEPENDENT_AMBULATORY_CARE_PROVIDER_SITE_OTHER): Payer: Medicare Other | Admitting: Adult Health

## 2018-10-14 DIAGNOSIS — R7881 Bacteremia: Secondary | ICD-10-CM | POA: Diagnosis not present

## 2018-10-14 DIAGNOSIS — G9341 Metabolic encephalopathy: Secondary | ICD-10-CM | POA: Diagnosis not present

## 2018-10-14 DIAGNOSIS — B955 Unspecified streptococcus as the cause of diseases classified elsewhere: Secondary | ICD-10-CM | POA: Diagnosis not present

## 2018-10-14 DIAGNOSIS — I059 Rheumatic mitral valve disease, unspecified: Secondary | ICD-10-CM

## 2018-10-14 DIAGNOSIS — I058 Other rheumatic mitral valve diseases: Secondary | ICD-10-CM | POA: Diagnosis not present

## 2018-10-14 NOTE — Telephone Encounter (Signed)
Spoke with pt and husband.  They were seen by PCP today and PCP wanted pt to have close f/u next week with Cardiology due to recent hospitalization.  Spoke with Dr. Tamala Julian and he agreed pt should be seen but he will be out of the office next week.  Scheduled pt to see Truitt Merle, NP on 6/29.  Pt unable to get into building on her own right now so husband will have to come with her.  Advised both will be called later this week to be screened for COVID virus and will need to wear mask on Monday.  Pt appreciative for call.

## 2018-10-14 NOTE — Telephone Encounter (Signed)
Patient's husband called stating that his wife was sick in the hospital with a blood infection.  Her family doctor advised them that she should see Dr. Tamala Julian next week.

## 2018-10-15 ENCOUNTER — Other Ambulatory Visit: Payer: Self-pay

## 2018-10-15 DIAGNOSIS — R7881 Bacteremia: Secondary | ICD-10-CM | POA: Diagnosis not present

## 2018-10-15 DIAGNOSIS — I2510 Atherosclerotic heart disease of native coronary artery without angina pectoris: Secondary | ICD-10-CM | POA: Diagnosis not present

## 2018-10-15 DIAGNOSIS — B955 Unspecified streptococcus as the cause of diseases classified elsewhere: Secondary | ICD-10-CM | POA: Diagnosis not present

## 2018-10-15 DIAGNOSIS — Z452 Encounter for adjustment and management of vascular access device: Secondary | ICD-10-CM | POA: Diagnosis not present

## 2018-10-15 DIAGNOSIS — E1165 Type 2 diabetes mellitus with hyperglycemia: Secondary | ICD-10-CM | POA: Diagnosis not present

## 2018-10-15 DIAGNOSIS — I330 Acute and subacute infective endocarditis: Secondary | ICD-10-CM | POA: Diagnosis not present

## 2018-10-15 NOTE — Patient Outreach (Signed)
[facility]) Care Management  10/15/2018  Breanna Webster 01/12/1946 [id number]  EMMI: general discharge red alert Referral date: 10/15/18 Referral reason: scheduled follow up: no Insurance:  Medicare Day # 1  Telephone call to patient regarding EMMI general discharge red alert. HIPAA verified with patient. RNCM introduced herself and explained reason for call.  Patient states she did not have a good night. Patient reports she is very groggy. She states she had a terrible headache last night and did not sleep. Patient states her husband is currently out getting groceries. Patient states she will have her husband call RNCM back.  Patient states she has nursing and occupational therapy services with [facility]. She states she is unsure if nurse is coming today. Patient states there was problem with her medication but states it is best to speak with her husband regarding this.  RNCM provided patient with contact name and phone number for return call from husband.     PLAN:  RNCM will await call from patients husband.  If no return call RNCM will follow up with patient within 2 business days.   Quinn Plowman RN,BSN,CCM [facility] Telephonic  [phone]

## 2018-10-16 ENCOUNTER — Ambulatory Visit: Payer: Self-pay

## 2018-10-16 ENCOUNTER — Telehealth: Payer: Self-pay | Admitting: Adult Health

## 2018-10-16 ENCOUNTER — Encounter: Payer: Self-pay | Admitting: Adult Health

## 2018-10-16 ENCOUNTER — Other Ambulatory Visit: Payer: Self-pay

## 2018-10-16 ENCOUNTER — Telehealth: Payer: Self-pay

## 2018-10-16 DIAGNOSIS — I2510 Atherosclerotic heart disease of native coronary artery without angina pectoris: Secondary | ICD-10-CM | POA: Diagnosis not present

## 2018-10-16 DIAGNOSIS — Z452 Encounter for adjustment and management of vascular access device: Secondary | ICD-10-CM | POA: Diagnosis not present

## 2018-10-16 DIAGNOSIS — B955 Unspecified streptococcus as the cause of diseases classified elsewhere: Secondary | ICD-10-CM | POA: Diagnosis not present

## 2018-10-16 DIAGNOSIS — E1165 Type 2 diabetes mellitus with hyperglycemia: Secondary | ICD-10-CM | POA: Diagnosis not present

## 2018-10-16 DIAGNOSIS — I330 Acute and subacute infective endocarditis: Secondary | ICD-10-CM | POA: Diagnosis not present

## 2018-10-16 DIAGNOSIS — R7881 Bacteremia: Secondary | ICD-10-CM | POA: Diagnosis not present

## 2018-10-16 NOTE — Patient Outreach (Signed)
[facility]) Care Management  10/16/2018  Breanna Webster June 26, 1945 [id number]  Entry for 10/15/18  EMMI: general discharge red alert Referral date: 10/15/18 Referral reason: scheduled follow up: no Insurance:  Medicare Day # 1  Returned telephone call to patients spouse, Scientist, physiological.  HIPAA verified by spouse for patient. RNCM introduced herself and explained reason for call.  Spouse states patient had vitural telephone visit with her primary care provider on 10/14/18.  Spouse states he had questions over the last\ fewl days regarding patients medication for her infusion.  Spouse states there was issues with the infusion medication administration. Spouse states he called phone number [phone] for [facility] but could not get anyone on the phone. Spouse states he attempted call to nurse with [facility] who trained him to administer the infusion but has not heard back.  Spouse voiced his frustration with having to administer this infusion to his wife is new to him. Spouse states, "I am not a health care professional."  Spouse states he needs to be able to have contact with someone when he has questions regarding the administration of his wife's infusion. Spouse states he was able to speak with someone at [facility] and a nurse will be coming out today to assist him with patients infusion. Spouse states he was able to speak with someone regarding patients infusion medication and additional medication will be provided. Spouse states patient will be receiving infusions over the next several weeks. RNCM offered follow up call to assist with care coordination as needed. Spouse verbally agreed. Spouse states this process has been very overwhelming to him and he appreciates the follow up call. RNCM confirmed with spouse patient has contact phone number for after hour concerns for [facility] and primary care provider. RNCM advised patient to notify MD of  any changes in condition prior to scheduled appointment. RNCM provided contact name and number:4 hour nurse advise line [phone].  RNCM verified patient aware of 911 services for urgent/ emergent needs.   PLAN; RNCM will follow up with patient and/ or spouse within 4 business days.   Quinn Plowman RN,BSN,CCM [facility] Telephonic  [phone]

## 2018-10-16 NOTE — Progress Notes (Signed)
Virtual Visit via Video Note  I connected with Breanna Webster on 06/23/2020at  2:30 PM EDT by a video enabled telemedicine application and verified that I am speaking with the correct person using two identifiers.  Location patient: home Location provider:work or home office Persons participating in the virtual visit: patient, provider, spouse  I discussed the limitations of evaluation and management by telemedicine and the availability of in person appointments. The patient expressed understanding and agreed to proceed.   HPI: 73 year old female who  has a past medical history of Arthritis, CAD (coronary artery disease), Carotid artery stenosis, Chronic lower back pain, Colon polyps, Diverticulitis, Diverticulosis, Esophageal thickening, GERD (gastroesophageal reflux disease), Grave's disease, History of colonic polyps (05/22/2017), History of hiatal hernia, Hypertension, Hypothyroidism, IBS (irritable bowel syndrome), Osteopenia, Pulmonary nodules, S/P TAVR (transcatheter aortic valve replacement), Severe aortic stenosis, Thalassemia minor, and Type II diabetes mellitus ([place]).  TCM visit   Admit Date: 10/03/2018 Discharge Date: 10/10/2018  Noted with acute shortness of breath, fever, and altered mental status.  Admission she was oriented to person and place only.  The ER he was hemodynamically stable except for respirations up to 31.  Oxygen saturation 93% on 2 L.  Leukocytosis to 16,000, lactate of 4.1, CT head, CTA chest abdomen and pelvis without acute significant findings.  Her COVID-19 test was negative.  Urinalysis was negative.  He received 4 L of IV fluid bolus and lactate trended down to 2.4.  He was started on broad-spectrum antibiotics including acyclovir.  Was then admitted for undifferentiated sepsis.  Her blood cultures grew GPC's/Streptococcus species and 3 out of the 4 bottles.  Her antibiotic regimen was de-escalated to ceftriaxone.  Doxycycline was discontinued  Then  underwent a TEE on  6/16 with evidence of infective endocarditis, without obvious TAVR valve infection.  ID was consulted she was started on IV gentamicin and Rocephin  Was discharged on 10/10/2018 with follow-up instructions with ID, cardiology, PCP.  Home health services have been arranged to assist with IV antibiotic infusions.  Will be receiving IV infusions of gentamicin x2 weeks total and ceftriaxone for 6 weeks total.  Home health will be conducting weekly CBC and twice weekly BMP as well as gentamicin troughs  Since being discharged from the hospital she continues to be very fatigued and does not have much appetite but is eating meals that her husband fixes for her.  She is sleeping most of the day.  She denies fevers or chills, nausea, vomiting, diarrhea.  Home health PT that the initial assessment yesterday.  Her husband is not having much difficulty with PICC line or infusing antibiotics.  ROS: See pertinent positives and negatives per HPI.  Past Medical History:  Diagnosis Date  . Arthritis    "back" (04/22/2018)  . CAD (coronary artery disease)    a. 03/2018 s/p PCI/DES to the RCA (3.0x15 Onyx DES).  . Carotid artery stenosis    Mild  . Chronic lower back pain   . Colon polyps   . Diverticulitis   . Diverticulosis   . Esophageal thickening    seen on pre TAVR CT scan, also questionable cirrhosis. MRI recommended. Will refer to GI  . GERD (gastroesophageal reflux disease)   . Grave's disease   . History of colonic polyps 05/22/2017  . History of hiatal hernia   . Hypertension   . Hypothyroidism   . IBS (irritable bowel syndrome)   . Osteopenia   . Pulmonary nodules    seen on pre  TAVR CT. likley benign. no follow up recommended if pt low risk.  . S/P TAVR (transcatheter aortic valve replacement)   . Severe aortic stenosis   . Thalassemia minor   . Type II diabetes mellitus ([place])     Past Surgical History:  Procedure Laterality Date  . [place] STUDY N/A  03/03/2018   Procedure: [place] STUDY;  Surgeon: Mauri Pole, MD;  Location: [facility] ENDOSCOPY;  Service: Endoscopy;  Laterality: N/A;  . COLONOSCOPY W/ BIOPSIES AND POLYPECTOMY    . CORONARY ANGIOGRAPHY Right 04/21/2018   Procedure: CORONARY ANGIOGRAPHY (CATH LAB);  Surgeon: [name], MD;  Location: [place] CV LAB;  Service: Cardiovascular;  Laterality: Right;  . CORONARY STENT INTERVENTION N/A 04/22/2018   Procedure: CORONARY STENT INTERVENTION;  Surgeon: [name], MD;  Location: [place] CV LAB;  Service: Cardiovascular;  Laterality: N/A;  . DILATION AND CURETTAGE OF UTERUS    . ESOPHAGEAL MANOMETRY N/A 03/03/2018   Procedure: ESOPHAGEAL MANOMETRY (EM);  Surgeon: Mauri Pole, MD;  Location: [facility] ENDOSCOPY;  Service: Endoscopy;  Laterality: N/A;  . HERNIA REPAIR    . HYSTEROSCOPY     fibroids  . LAPAROSCOPIC CHOLECYSTECTOMY    . LAPAROSCOPY     fibroids  . NISSEN FUNDOPLICATION  7096G  . RIGHT/LEFT HEART CATH AND CORONARY ANGIOGRAPHY N/A 02/20/2018   Procedure: RIGHT/LEFT HEART CATH AND CORONARY ANGIOGRAPHY;  Surgeon: [name], MD;  Location: [place] CV LAB;  Service: Cardiovascular;  Laterality: N/A;  . TEE WITHOUT CARDIOVERSION N/A 07/08/2018   Procedure: TRANSESOPHAGEAL ECHOCARDIOGRAM (TEE);  Surgeon: Burnell Blanks, MD;  Location: [place];  Service: Open Heart Surgery;  Laterality: N/A;  . TEE WITHOUT CARDIOVERSION  10/07/2018  . TEE WITHOUT CARDIOVERSION N/A 10/07/2018   Procedure: TRANSESOPHAGEAL ECHOCARDIOGRAM (TEE);  Surgeon: Jerline Pain, MD;  Location: [facility] ENDOSCOPY;  Service: Cardiovascular;  Laterality: N/A;  . TONSILLECTOMY    . TRANSCATHETER AORTIC VALVE REPLACEMENT, TRANSFEMORAL N/A 07/08/2018   Procedure: TRANSCATHETER AORTIC VALVE REPLACEMENT, TRANSFEMORAL;  Surgeon: Burnell Blanks, MD;  Location: [facility];  Service: Open Heart Surgery;  Laterality: N/A;    Family History  Adopted: Yes  Problem Relation Age of Onset   . Healthy Son        x 2  . Headache Other        Cluster headaches  . Heart failure Mother   . Colon cancer Neg Hx   . Pancreatic cancer Neg Hx   . Rectal cancer Neg Hx   . Stomach cancer Neg Hx      Current Outpatient Medications:  .  acetaminophen (TYLENOL) 500 MG tablet, Take 1,000 mg by mouth every 6 (six) hours as needed (for pain.)., Disp: , Rfl:  .  aspirin 81 MG tablet, Take 81 mg by mouth daily.  , Disp: , Rfl:  .  bisoprolol (ZEBETA) 5 MG tablet, TAKE 1 TABLET BY MOUTH EVERY DAY, Disp: 90 tablet, Rfl: 0 .  cefTRIAXone (ROCEPHIN) IVPB, Inject 2 g into the vein daily. Indication:  Strep bacteremia with endocarditis Last Day of Therapy:  11/18/18 Labs - Once weekly:  CBC/D and BMP, Labs - Every other week:  ESR and CRP, Disp: 40 Units, Rfl: 0 .  chlorpheniramine (CHLOR-TRIMETON) 4 MG tablet, Take 4 mg by mouth at bedtime. , Disp: , Rfl:  .  cholecalciferol (VITAMIN D) 1000 units tablet, Take 1,000 Units by mouth daily., Disp: , Rfl:  .  clopidogrel (PLAVIX) 75  MG tablet, Take 1 tablet (75 mg total) by mouth daily with breakfast., Disp: 30 tablet, Rfl: 6 .  escitalopram (LEXAPRO) 20 MG tablet, TAKE 1 TABLET BY MOUTH EVERY DAY, Disp: 90 tablet, Rfl: 0 .  ferrous sulfate 325 (65 FE) MG tablet, Take 1 tablet (325 mg total) by mouth 2 (two) times daily with a meal., Disp: 60 tablet, Rfl: 0 .  gentamicin (GARAMYCIN) IVPB, Inject 80 mg into the vein daily for 12 days. Indication:  Strep bacteremia with endocarditis Last Day of Therapy:  10/21/18 Labs - Sunday/Monday:  CBC/D, BMP, and gentamicin trough. Labs - Thursday:  BMP and gentamicin trough Labs - Every other week:  ESR and CRP, Disp: 12 Units, Rfl: 0 .  hydrochlorothiazide (HYDRODIURIL) 12.5 MG tablet, TAKE 1 TABLET BY MOUTH EVERY DAY, Disp: 90 tablet, Rfl: 0 .  insulin NPH Human (NOVOLIN N RELION) 100 UNIT/ML injection, Inject 0.4 mLs (40 Units total) into the skin every morning. And syringes 1/day, Disp: 30 mL, Rfl: 0 .  Insulin  Syringe-Needle U-100 (BD INSULIN SYRINGE U/F) 31G X 5/16" 1 ML MISC, USE TO INJECT INSULIN ONCE DAILY, Disp: 90 each, Rfl: 2 .  losartan (COZAAR) 50 MG tablet, TAKE 1 TABLET(50 MG) BY MOUTH DAILY (Patient taking differently: Take 50 mg by mouth daily. ), Disp: 90 tablet, Rfl: 0 .  Multiple Vitamin (MULITIVITAMIN WITH MINERALS) TABS, Take 1 tablet by mouth daily., Disp: , Rfl:  .  nitroGLYCERIN (NITROSTAT) 0.4 MG SL tablet, Place 1 tablet (0.4 mg total) under the tongue every 5 (five) minutes as needed for chest pain., Disp: 25 tablet, Rfl: 3 .  nortriptyline (PAMELOR) 25 MG capsule, TAKE 1 CAPSULE(25 MG) BY MOUTH AT BEDTIME (Patient taking differently: Take 25 mg by mouth at bedtime. ), Disp: 90 capsule, Rfl: 0 .  ONETOUCH DELICA LANCETS 66A MISC, USE TO CHECK BLOOD SUGAR TWICE DAILY (Patient taking differently: 2 (two) times a day. ), Disp: 200 each, Rfl: 0 .  ONETOUCH VERIO test strip, USE TO CHECK BLOOD SUGAR TWICE DAILY (Patient taking differently: 1 each by Other route 2 (two) times a day. ), Disp: 200 each, Rfl: 0 .  pantoprazole (PROTONIX) 40 MG tablet, Take 1 tablet (40 mg total) by mouth 2 (two) times daily., Disp: 60 tablet, Rfl: 5 .  sodium chloride 0.9 %, Inject 1,000 mLs into the vein daily for 12 days. Infuse over four hours after gentamicin infusion., Disp: 12000 mL, Rfl: 0 .  SYNTHROID 175 MCG tablet, TAKE 1 TABLET BY MOUTH EVERY DAY, Disp: 90 tablet, Rfl: 0  EXAM:  VITALS per patient if applicable:  GENERAL: alert, oriented, appears tired  HEENT: atraumatic, conjunttiva clear, no obvious abnormalities on inspection of external nose and ears  NECK: normal movements of the head and neck  LUNGS: on inspection no signs of respiratory distress, breathing rate appears normal, no obvious gross SOB, gasping or wheezing  CV: no obvious cyanosis  MS: moves all visible extremities without noticeable abnormality  PSYCH/NEURO: pleasant and cooperative, no obvious depression or anxiety,  speech and thought processing grossly intact  ASSESSMENT AND PLAN:  Discussed the following assessment and plan:  We reviewed discharge instructions, labs and imaging performed during her hospital stay.  All questions answered to the best of my ability  Advised to continue to advance diet as tolerated.  Fatigue is not surprising since being discharged from the hospital.  We will keep an eye on weekly labs.  She has a follow-up appointment early next week  with infectious disease.  Advised that they might want to call cardiology and see if they can get a sooner appointment than August.  Patient and her husband were both advised to follow-up as needed    I discussed the assessment and treatment plan with the patient. The patient was provided an opportunity to ask questions and all were answered. The patient agreed with the plan and demonstrated an understanding of the instructions.   The patient was advised to call back or seek an in-person evaluation if the symptoms worsen or if the condition fails to improve as anticipated.   [name], NP

## 2018-10-16 NOTE — Patient Outreach (Signed)
[facility]) Care Management  10/16/2018  Breanna Webster 24-Jun-1945 [id number]  EMMI:general discharge red alert Referral date:10/15/18 Referral reason:scheduled follow up: no Insurance: Medicare Day #1  Voice mail message received from patients spouse, Synthia Fairbank requesting a return call. Spouse left message stating the rocephin syringe would not push through patients PICC line and would only leak. RNCM called [facility] infusion department and spoke with Stanton Kidney, registered nurse who stated she was familiar with patients case.   Stanton Kidney states she has spoken with the nurse that visited patient on yesterday and the nurse that saw patient today.  Stanton Kidney states the nurse that saw patient today is going back to the home to assist patients spouse with the medication issue.  Stanton Kidney stated if patient's spouse continued to have difficulty with administering the rocephin by syringe they would look at another method of delivery for the antibiotic.   RNCM returned call to patients spouse. Spouse states [facility] nurse followed up with patient on yesterday and this morning. Spouse states after nurse left today he attempted to administer patients rocephin syringe via  PICC line.  Spouse states the rocephin syringe again would not push the medicine through the syringe.  It only leaked.  Spouse states he was able to push the normal saline solution through the PICC line and administer patients gentamicin.  RNCM informed patient that she had spoken with [facility] with [facility] regarding his medication concern. Spouse states he is not sure why he is having trouble with the syringe. Spouse states he is down a rocephin syringe. Spouse states he received additional rocephin syringes.  RNCM advised spouse to inform the nurse when she comes out to assist him.  Spouse verbalized appreciation of call.  RNCM offered to return call to spouse within 1 business day to confirm patient has  had follow up with [facility] nurse and sufficient amount of rocephin syringes.   PLAN;  RNCM will follow up with patient and/ or spouse within 2 business days.   Quinn Plowman RN,BSN,CCM [facility] Telephonic  [phone]

## 2018-10-16 NOTE — Patient Outreach (Signed)
[facility]) Care Management  10/16/2018  ABUK SELLECK December 18, 1945 [id number]  EMMI:general discharge red alert Referral date:10/15/18 Referral reason:scheduled follow up: no Insurance: Medicare Day #1  Telephone follow up call to patients spouse, Rennie Plowman. HIPAA verified by spouse for patient. Spouse states the nurse with [facility] came out to the home to check rocephin syringe and assist spouse in administering patients dosage.  Spouse states the rocephin syringe was defective. Spouse states he has additional rocephin syringes that were delivered last night.  Spouse states [facility] nurse is scheduled to return on Monday 10/20/18.  Spouse expressed no further needs at this time.  Spouse verbalized appreciation of follow up call.   PLAN; RNCM will follow up with patient and/or spouse at next scheduled outreach.   Quinn Plowman RN,BSN,CCM [facility] Telephonic  [phone]

## 2018-10-16 NOTE — Telephone Encounter (Signed)
I called to do screening questions and patients husband wants to cancel appointment. He states that he feels like his wife can not leave the house for the next 4 weeks due to be hospitalized. She is having home health come and states she will be hooked up and getting antibiotics. I will cancel appointment, unless Breanna Webster wants to leave her on the schedule and do a video call.

## 2018-10-16 NOTE — Telephone Encounter (Signed)
Copied from [place] [phone]. Topic: Quick Communication - Home Health Verbal Orders >> Oct 16, 2018  2:57 PM Ivar Drape wrote: Caller/Agency:  Henderson Newcomer OT w/[facility] Number:  [phone] - orders can be left on voicemail Requesting OT/PT/Skilled Nursing/Social Work/Speech Therapy:  OT  Frequency:   2w1, 1w6

## 2018-10-17 ENCOUNTER — Other Ambulatory Visit: Payer: Self-pay

## 2018-10-17 ENCOUNTER — Emergency Department (HOSPITAL_COMMUNITY)
Admission: EM | Admit: 2018-10-17 | Discharge: 2018-10-17 | Disposition: A | Discharge status: Home / Self Care | Payer: Medicare Other | Attending: Emergency Medicine | Admitting: Emergency Medicine

## 2018-10-17 ENCOUNTER — Other Ambulatory Visit: Payer: Self-pay | Admitting: Adult Health

## 2018-10-17 ENCOUNTER — Encounter (HOSPITAL_COMMUNITY): Payer: Self-pay | Admitting: Emergency Medicine

## 2018-10-17 DIAGNOSIS — I10 Essential (primary) hypertension: Secondary | ICD-10-CM | POA: Insufficient documentation

## 2018-10-17 DIAGNOSIS — T82898A Other specified complication of vascular prosthetic devices, implants and grafts, initial encounter: Secondary | ICD-10-CM | POA: Diagnosis not present

## 2018-10-17 DIAGNOSIS — Z794 Long term (current) use of insulin: Secondary | ICD-10-CM | POA: Diagnosis not present

## 2018-10-17 DIAGNOSIS — E039 Hypothyroidism, unspecified: Secondary | ICD-10-CM | POA: Insufficient documentation

## 2018-10-17 DIAGNOSIS — I330 Acute and subacute infective endocarditis: Secondary | ICD-10-CM | POA: Diagnosis not present

## 2018-10-17 DIAGNOSIS — Z452 Encounter for adjustment and management of vascular access device: Secondary | ICD-10-CM | POA: Diagnosis not present

## 2018-10-17 DIAGNOSIS — Z79899 Other long term (current) drug therapy: Secondary | ICD-10-CM | POA: Insufficient documentation

## 2018-10-17 DIAGNOSIS — E119 Type 2 diabetes mellitus without complications: Secondary | ICD-10-CM | POA: Diagnosis not present

## 2018-10-17 DIAGNOSIS — Z7982 Long term (current) use of aspirin: Secondary | ICD-10-CM | POA: Diagnosis not present

## 2018-10-17 NOTE — Discharge Instructions (Signed)
Return with any concerns.

## 2018-10-17 NOTE — ED Notes (Signed)
IV team at bedside 

## 2018-10-17 NOTE — ED Provider Notes (Signed)
[facility] EMERGENCY DEPARTMENT Provider Note   CSN: [id number] Arrival date & time: 10/17/18  1810     History   Chief Complaint Chief Complaint  Patient presents with  . Picc line won't flush    HPI Breanna Webster is a 73 y.o. female.     Patient with recent admission to the hospital for infective endocarditis, currently on gentamicin and Rocephin through a left upper extremity PICC line --presents to the emergency department tonight because she was unable to get the PICC line to flush and she cannot give herself antibiotics.  She denies other medical complaints.  She states that she has just been "recuperating" at home.  Denies any current fever, nausea or vomiting.  No pain to the upper extremity.  No surrounding tenderness or redness to the site.     Past Medical History:  Diagnosis Date  . Arthritis    "back" (04/22/2018)  . CAD (coronary artery disease)    a. 03/2018 s/p PCI/DES to the RCA (3.0x15 Onyx DES).  . Carotid artery stenosis    Mild  . Chronic lower back pain   . Colon polyps   . Diverticulitis   . Diverticulosis   . Esophageal thickening    seen on pre TAVR CT scan, also questionable cirrhosis. MRI recommended. Will refer to GI  . GERD (gastroesophageal reflux disease)   . Grave's disease   . History of colonic polyps 05/22/2017  . History of hiatal hernia   . Hypertension   . Hypothyroidism   . IBS (irritable bowel syndrome)   . Osteopenia   . Pulmonary nodules    seen on pre TAVR CT. likley benign. no follow up recommended if pt low risk.  . S/P TAVR (transcatheter aortic valve replacement)   . Severe aortic stenosis   . Thalassemia minor   . Type II diabetes mellitus [facility])     Patient Active Problem List   Diagnosis Date Noted  . Endocarditis of mitral valve 10/07/2018  . Bacteremia due to Streptococcus Salivarius 10/07/2018  . Sepsis ([place]) 10/04/2018  . Acute metabolic encephalopathy 19/75/8832  . Severe aortic  stenosis 07/08/2018  . S/P TAVR (transcatheter aortic valve replacement) 07/08/2018  . Esophageal thickening   . CAD in native artery 04/22/2018  . Gastroesophageal reflux disease   . Pulmonary hypertension ([place]) 02/21/2018  . Essential hypertension 07/15/2017  . History of colonic polyps 05/22/2017  . Elevated liver function tests 12/05/2016  . Thalassemia minor 05/29/2016  . Left bundle branch block 12/06/2015  . Upper airway cough syndrome 10/14/2015  . Myalgia 02/17/2014  . Carotid artery stenosis 06/05/2013  . Eustachian tube dysfunction 05/07/2013  . Neuropathy of leg 03/07/2012  . Anemia, unspecified 01/31/2012  . Hot flashes 08/24/2010  . Diabetes mellitus due to underlying condition, uncontrolled ([place]) 06/30/2010  . Obesity 12/21/2009  . Vitamin D deficiency 03/10/2009  . Hypothyroidism 12/13/2008  . Dyslipidemia 12/13/2008  . Anxiety state 12/13/2008  . Other specified disorders of bladder 12/13/2008    Past Surgical History:  Procedure Laterality Date  . [place] STUDY N/A 03/03/2018   Procedure: [place] STUDY;  Surgeon: Mauri Pole, MD;  Location: [facility];  Service: Endoscopy;  Laterality: N/A;  . COLONOSCOPY W/ BIOPSIES AND POLYPECTOMY    . CORONARY ANGIOGRAPHY Right 04/21/2018   Procedure: CORONARY ANGIOGRAPHY (CATH LAB);  Surgeon: [name], MD;  Location: [place] CV LAB;  Service: Cardiovascular;  Laterality: Right;  . CORONARY STENT  INTERVENTION N/A 04/22/2018   Procedure: CORONARY STENT INTERVENTION;  Surgeon: [name], MD;  Location: [place] CV LAB;  Service: Cardiovascular;  Laterality: N/A;  . DILATION AND CURETTAGE OF UTERUS    . ESOPHAGEAL MANOMETRY N/A 03/03/2018   Procedure: ESOPHAGEAL MANOMETRY (EM);  Surgeon: Mauri Pole, MD;  Location: [facility];  Service: Endoscopy;  Laterality: N/A;  . HERNIA REPAIR    . HYSTEROSCOPY     fibroids  . LAPAROSCOPIC CHOLECYSTECTOMY    . LAPAROSCOPY     fibroids  .  NISSEN FUNDOPLICATION  7893Y  . RIGHT/LEFT HEART CATH AND CORONARY ANGIOGRAPHY N/A 02/20/2018   Procedure: RIGHT/LEFT HEART CATH AND CORONARY ANGIOGRAPHY;  Surgeon: [name], MD;  Location: [place] CV LAB;  Service: Cardiovascular;  Laterality: N/A;  . TEE WITHOUT CARDIOVERSION N/A 07/08/2018   Procedure: TRANSESOPHAGEAL ECHOCARDIOGRAM (TEE);  Surgeon: Burnell Blanks, MD;  Location: [facility] [place];  Service: Open Heart Surgery;  Laterality: N/A;  . TEE WITHOUT CARDIOVERSION  10/07/2018  . TEE WITHOUT CARDIOVERSION N/A 10/07/2018   Procedure: TRANSESOPHAGEAL ECHOCARDIOGRAM (TEE);  Surgeon: Jerline Pain, MD;  Location: [facility] ENDOSCOPY;  Service: Cardiovascular;  Laterality: N/A;  . TONSILLECTOMY    . TRANSCATHETER AORTIC VALVE REPLACEMENT, TRANSFEMORAL N/A 07/08/2018   Procedure: TRANSCATHETER AORTIC VALVE REPLACEMENT, TRANSFEMORAL;  Surgeon: Burnell Blanks, MD;  Location: [facility];  Service: Open Heart Surgery;  Laterality: N/A;     OB History    Gravida  2   Para  2   Term  2   Preterm      AB      Living        SAB      TAB      Ectopic      Multiple      Live Births               Home Medications    Prior to Admission medications   Medication Sig Start Date End Date Taking? Authorizing Provider  acetaminophen (TYLENOL) 500 MG tablet Take 1,000 mg by mouth every 6 (six) hours as needed (for pain.).    [provider]  aspirin 81 MG tablet Take 81 mg by mouth daily.      [provider]  bisoprolol (ZEBETA) 5 MG tablet TAKE 1 TABLET BY MOUTH EVERY DAY 07/04/18   Nafziger, Tommi Rumps, NP  cefTRIAXone (ROCEPHIN) IVPB Inject 2 g into the vein daily. Indication:  Strep bacteremia with endocarditis Last Day of Therapy:  11/18/18 Labs - Once weekly:  CBC/D and BMP, Labs - Every other week:  ESR and CRP 10/09/18 11/18/18  Kayleen Memos, DO  chlorpheniramine (CHLOR-TRIMETON) 4 MG tablet Take 4 mg by mouth at bedtime.     [provider]   cholecalciferol (VITAMIN D) 1000 units tablet Take 1,000 Units by mouth daily.    [provider]  clopidogrel (PLAVIX) 75 MG tablet Take 1 tablet (75 mg total) by mouth daily with breakfast. 04/24/18   Theora Gianotti, NP  escitalopram (LEXAPRO) 20 MG tablet TAKE 1 TABLET BY MOUTH EVERY DAY 07/04/18   Nafziger, Tommi Rumps, NP  ferrous sulfate 325 (65 FE) MG tablet Take 1 tablet (325 mg total) by mouth 2 (two) times daily with a meal. 10/09/18   Kayleen Memos, DO  gentamicin (GARAMYCIN) IVPB Inject 80 mg into the vein daily for 12 days. Indication:  Strep bacteremia with endocarditis Last Day of Therapy:  10/21/18 Labs - Sunday/Monday:  CBC/D, BMP, and gentamicin trough. Labs - Thursday:  BMP and gentamicin trough Labs - Every other week:  ESR and CRP 10/09/18 10/21/18  Kayleen Memos, DO  hydrochlorothiazide (HYDRODIURIL) 12.5 MG tablet TAKE 1 TABLET BY MOUTH EVERY DAY 08/27/18   Nafziger, Tommi Rumps, NP  insulin NPH Human (NOVOLIN N RELION) 100 UNIT/ML injection Inject 0.4 mLs (40 Units total) into the skin every morning. And syringes 1/day 10/09/18   Kayleen Memos, DO  Insulin Syringe-Needle U-100 (BD INSULIN SYRINGE U/F) 31G X 5/16" 1 ML MISC USE TO INJECT INSULIN ONCE DAILY 03/27/18   Renato Shin, MD  losartan (COZAAR) 50 MG tablet TAKE 1 TABLET(50 MG) BY MOUTH DAILY Patient taking differently: Take 50 mg by mouth daily.  10/01/18   Nafziger, Tommi Rumps, NP  Multiple Vitamin (MULITIVITAMIN WITH MINERALS) TABS Take 1 tablet by mouth daily.    [provider]  nitroGLYCERIN (NITROSTAT) 0.4 MG SL tablet Place 1 tablet (0.4 mg total) under the tongue every 5 (five) minutes as needed for chest pain. 04/23/18   Theora Gianotti, NP  nortriptyline (PAMELOR) 25 MG capsule TAKE 1 CAPSULE(25 MG) BY MOUTH AT BEDTIME Patient taking differently: Take 25 mg by mouth at bedtime.  10/01/18   Nafziger, Tommi Rumps, NP  ONETOUCH DELICA LANCETS 52C MISC USE TO CHECK BLOOD SUGAR TWICE DAILY Patient taking  differently: 2 (two) times a day.  02/26/18   Renato Shin, MD  [facility] VERIO test strip USE TO CHECK BLOOD SUGAR TWICE DAILY Patient taking differently: 1 each by Other route 2 (two) times a day.  08/23/17   Renato Shin, MD  pantoprazole (PROTONIX) 40 MG tablet Take 1 tablet (40 mg total) by mouth 2 (two) times daily. 10/01/18   [name], [name], MD  sodium chloride 0.9 % Inject 1,000 mLs into the vein daily for 12 days. Infuse over four hours after gentamicin infusion. 10/09/18 10/21/18  Kayleen Memos, DO  SYNTHROID 175 MCG tablet TAKE 1 TABLET BY MOUTH EVERY DAY 10/02/18   Renato Shin, MD    Family History Family History  Adopted: Yes  Problem Relation Age of Onset  . Healthy Son        x 2  . Headache Other        Cluster headaches  . Heart failure Mother   . Colon cancer Neg Hx   . Pancreatic cancer Neg Hx   . Rectal cancer Neg Hx   . Stomach cancer Neg Hx     Social History Social History   Tobacco Use  . Smoking status: Never Smoker  . Smokeless tobacco: Never Used  Substance Use Topics  . Alcohol use: No    Alcohol/week: 0.0 standard drinks  . Drug use: Never     Allergies   Statins   Review of Systems Review of Systems  Constitutional: Negative for fever.  Gastrointestinal: Negative for nausea and vomiting.  Musculoskeletal: Negative for myalgias.  Skin: Negative for rash.     Physical Exam Updated Vital Signs BP 140/71   Pulse 71 Comment: Simultaneous filing. User may not have seen previous data.  Temp 98.8 F (37.1 C) (Oral)   Resp 18   Ht _0  (1.651 m)   Wt 89.8 kg   SpO2 97% Comment: Simultaneous filing. User may not have seen previous data.  BMI 32.95 kg/m   Physical Exam Vitals signs and nursing note reviewed.  Constitutional:      Appearance: She is well-developed.  HENT:  Head: Normocephalic and atraumatic.  Eyes:     Conjunctiva/sclera: Conjunctivae normal.  Neck:     Musculoskeletal: Normal range of motion and neck  supple.  Pulmonary:     Effort: No respiratory distress.  Musculoskeletal:     Comments: Left upper extremity PICC line in place.  Insertion site appears clean.  No surrounding erythema or redness.  Skin:    General: Skin is warm and dry.  Neurological:     Mental Status: She is alert.      ED Treatments / Results  Labs (all labs ordered are listed, but only abnormal results are displayed) Labs Reviewed - No data to display  EKG    Radiology No results found.  Procedures Procedures (including critical care time)  Medications Ordered in ED Medications - No data to display   Initial Impression / Assessment and Plan / ED Course  I have reviewed the triage vital signs and the nursing notes.  Pertinent labs & imaging results that were available during my care of the patient were reviewed by me and considered in my medical decision making (see chart for details).        Patient seen and examined. Awaiting IV team.   Vital signs reviewed and are as follows: BP 140/71   Pulse 71 Comment: Simultaneous filing. User may not have seen previous data.  Temp 98.8 F (37.1 C) (Oral)   Resp 18   Ht _0  (1.651 m)   Wt 89.8 kg   SpO2 97% Comment: Simultaneous filing. User may not have seen previous data.  BMI 32.95 kg/m   7:25 PM IV team has seen patient. PICC now working appropriately. Pt ready for d/c. RN has updated family.   Final Clinical Impressions(s) / ED Diagnoses   Final diagnoses:  Occluded PICC line, initial encounter [facility])   PICC line malfunction, now working appropriately.  Ready for discharge.  No signs of site infection.  ED Discharge Orders    None       Carlisle Cater, Hershal Coria 10/17/18 1927    Carmin Muskrat, MD 10/17/18 2012

## 2018-10-17 NOTE — ED Triage Notes (Signed)
PICC line to left upper arm will not flush.

## 2018-10-17 NOTE — Telephone Encounter (Signed)
Copied from [place] [phone]. Topic: Quick Communication - Rx Refill/Question >> Oct 17, 2018 10:18 AM Carolyn Stare wrote: Medication  bisoprolol (ZEBETA) 5 MG tablet    escitalopram (LEXAPRO) 20 MG tablet   Preferred Pharmacy [facility] [address]   Agent: Please be advised that RX refills may take up to 3 business days. We ask that you follow-up with your pharmacy.

## 2018-10-17 NOTE — Patient Outreach (Signed)
[facility]) Care Management  10/17/2018  Breanna Webster [birth year] [id number]   EMMI:general discharge red alert Referral date:10/15/18 Insurance: Medicare  Phone message left by patients husband, Andalyn Heckstall stating he is having problems with flushing the purple port of patients PICC line port.    RNCM called [facility] infusion department and spoke with Debbie. Explained to Jackelyn Poling that Mr. Vandenberg is having difficulty with flushing patients PICC line port.   RNCM requested one of the nurse contact Mr. Olivera to assist him with this issue.  Jackelyn Poling states she will have the pharmacist contact Mr.Rail.  Return telephone call to patient spouse, Drue Camera. HIPAA verified by spouse for patient.  Spouse states he was able to administer patients antibiotics without difficulty today. He states he has to flush patients purple port of her PICC line with heparin and saline. Spouse states he is not able to push the heparin or saline in the port.  Spouse states he checked everything including the clamps and still was not able to flush the port. Spouse states he is concerned because he has to give patient her antibiotic through the purple port.   RNCM explained to spouse that [facility] infusion nurse has been contacted and he will be receiving a call from the to assist with this situation.  Spouse verbalized understanding and appreciation of follow up call. RNCM advised spouse if he does not hear from anyone to call the [facility] contact phone number. RNCM confirmed spouse has contact phone number for [facility].   PLAN; RNCM will follow up with patient and/ or spouse within 4 business days.   Quinn Plowman RN,BSN,CCM [facility] Telephonic  [phone]

## 2018-10-20 ENCOUNTER — Other Ambulatory Visit: Payer: Self-pay

## 2018-10-20 ENCOUNTER — Ambulatory Visit: Payer: Medicare Other | Admitting: Nurse Practitioner

## 2018-10-20 DIAGNOSIS — R7881 Bacteremia: Secondary | ICD-10-CM | POA: Diagnosis not present

## 2018-10-20 DIAGNOSIS — I330 Acute and subacute infective endocarditis: Secondary | ICD-10-CM | POA: Diagnosis not present

## 2018-10-20 DIAGNOSIS — I2510 Atherosclerotic heart disease of native coronary artery without angina pectoris: Secondary | ICD-10-CM | POA: Diagnosis not present

## 2018-10-20 DIAGNOSIS — E1165 Type 2 diabetes mellitus with hyperglycemia: Secondary | ICD-10-CM | POA: Diagnosis not present

## 2018-10-20 DIAGNOSIS — B955 Unspecified streptococcus as the cause of diseases classified elsewhere: Secondary | ICD-10-CM | POA: Diagnosis not present

## 2018-10-20 DIAGNOSIS — Z452 Encounter for adjustment and management of vascular access device: Secondary | ICD-10-CM | POA: Diagnosis not present

## 2018-10-20 NOTE — Telephone Encounter (Signed)
Manuela Schwartz called checking status and also would like to add OT  OT : 2x 1 week 1x 6 weeks  PT: 2x 1 week 1x 8 weeks.  Henderson Newcomer: [phone] Can leave verbal orders on VM, susan cannot see patient until she gets verbals from PCP.

## 2018-10-20 NOTE — Telephone Encounter (Signed)
S/w pt's husband stated pt is not in any condition to have an appt.  [facility] nurse with there with pt.  Pt has a pic line.  Husband stated pic line is not being removed till July 26.  Husband made appt the beginning of Aug with Truitt Merle, NP, pt does have appt with Dr. Tamala Julian the end of aug.  Will send to [place] to [place].

## 2018-10-20 NOTE — Patient Outreach (Signed)
[place] [facility]) Care Management  10/20/2018  Breanna Webster 18-Feb-1946 [id number]  EMMI:general discharge red alert Referral date:10/15/18 Referral reason:  Scheduled follow up: NO,  Lost interest in things: yes Day #4 Insurance: Medicare Attempt #1  Telephone call to patient regarding EMMI general discharge red alert referral. Unable to reach patient. HIPAA compliant voice message left with call back phone number.   PLAN: RNCM will attempt 2nd telephone call to patient within 4 business days. RNCM will send outreach letter to attempt contact.   Quinn Plowman RN, BSN, [place] Telephonic  [phone]

## 2018-10-20 NOTE — Telephone Encounter (Signed)
Noted. Will be available as needed.   [name]

## 2018-10-21 ENCOUNTER — Inpatient Hospital Stay (HOSPITAL_COMMUNITY)
Admission: EM | Admit: 2018-10-21 | Discharge: 2018-10-23 | DRG: 064 | Disposition: A | Discharge status: Home Health | Payer: Medicare Other | Attending: Internal Medicine | Admitting: Internal Medicine

## 2018-10-21 ENCOUNTER — Encounter (HOSPITAL_COMMUNITY): Payer: Self-pay | Admitting: Emergency Medicine

## 2018-10-21 ENCOUNTER — Inpatient Hospital Stay (HOSPITAL_COMMUNITY): Payer: Medicare Other

## 2018-10-21 ENCOUNTER — Other Ambulatory Visit: Payer: Self-pay

## 2018-10-21 ENCOUNTER — Emergency Department (HOSPITAL_COMMUNITY): Payer: Medicare Other

## 2018-10-21 ENCOUNTER — Other Ambulatory Visit (HOSPITAL_COMMUNITY): Payer: Medicare Other

## 2018-10-21 DIAGNOSIS — E890 Postprocedural hypothyroidism: Secondary | ICD-10-CM | POA: Diagnosis present

## 2018-10-21 DIAGNOSIS — E039 Hypothyroidism, unspecified: Secondary | ICD-10-CM | POA: Diagnosis present

## 2018-10-21 DIAGNOSIS — I639 Cerebral infarction, unspecified: Secondary | ICD-10-CM | POA: Diagnosis not present

## 2018-10-21 DIAGNOSIS — I63443 Cerebral infarction due to embolism of bilateral cerebellar arteries: Principal | ICD-10-CM | POA: Diagnosis present

## 2018-10-21 DIAGNOSIS — Z1159 Encounter for screening for other viral diseases: Secondary | ICD-10-CM | POA: Diagnosis not present

## 2018-10-21 DIAGNOSIS — K219 Gastro-esophageal reflux disease without esophagitis: Secondary | ICD-10-CM | POA: Diagnosis present

## 2018-10-21 DIAGNOSIS — Z952 Presence of prosthetic heart valve: Secondary | ICD-10-CM

## 2018-10-21 DIAGNOSIS — I447 Left bundle-branch block, unspecified: Secondary | ICD-10-CM | POA: Diagnosis not present

## 2018-10-21 DIAGNOSIS — I272 Pulmonary hypertension, unspecified: Secondary | ICD-10-CM | POA: Diagnosis present

## 2018-10-21 DIAGNOSIS — Z79899 Other long term (current) drug therapy: Secondary | ICD-10-CM

## 2018-10-21 DIAGNOSIS — Z888 Allergy status to other drugs, medicaments and biological substances status: Secondary | ICD-10-CM

## 2018-10-21 DIAGNOSIS — Z955 Presence of coronary angioplasty implant and graft: Secondary | ICD-10-CM

## 2018-10-21 DIAGNOSIS — I350 Nonrheumatic aortic (valve) stenosis: Secondary | ICD-10-CM | POA: Diagnosis not present

## 2018-10-21 DIAGNOSIS — E119 Type 2 diabetes mellitus without complications: Secondary | ICD-10-CM

## 2018-10-21 DIAGNOSIS — Z79890 Hormone replacement therapy (postmenopausal): Secondary | ICD-10-CM

## 2018-10-21 DIAGNOSIS — G8929 Other chronic pain: Secondary | ICD-10-CM | POA: Diagnosis present

## 2018-10-21 DIAGNOSIS — I340 Nonrheumatic mitral (valve) insufficiency: Secondary | ICD-10-CM

## 2018-10-21 DIAGNOSIS — I2510 Atherosclerotic heart disease of native coronary artery without angina pectoris: Secondary | ICD-10-CM | POA: Diagnosis present

## 2018-10-21 DIAGNOSIS — I10 Essential (primary) hypertension: Secondary | ICD-10-CM | POA: Diagnosis present

## 2018-10-21 DIAGNOSIS — G9389 Other specified disorders of brain: Secondary | ICD-10-CM | POA: Diagnosis not present

## 2018-10-21 DIAGNOSIS — E0500 Thyrotoxicosis with diffuse goiter without thyrotoxic crisis or storm: Secondary | ICD-10-CM | POA: Diagnosis present

## 2018-10-21 DIAGNOSIS — K589 Irritable bowel syndrome without diarrhea: Secondary | ICD-10-CM | POA: Diagnosis present

## 2018-10-21 DIAGNOSIS — I330 Acute and subacute infective endocarditis: Secondary | ICD-10-CM | POA: Diagnosis present

## 2018-10-21 DIAGNOSIS — Z8619 Personal history of other infectious and parasitic diseases: Secondary | ICD-10-CM | POA: Diagnosis not present

## 2018-10-21 DIAGNOSIS — Z794 Long term (current) use of insulin: Secondary | ICD-10-CM

## 2018-10-21 DIAGNOSIS — I4891 Unspecified atrial fibrillation: Secondary | ICD-10-CM | POA: Diagnosis present

## 2018-10-21 DIAGNOSIS — E1140 Type 2 diabetes mellitus with diabetic neuropathy, unspecified: Secondary | ICD-10-CM | POA: Diagnosis present

## 2018-10-21 DIAGNOSIS — R42 Dizziness and giddiness: Secondary | ICD-10-CM | POA: Diagnosis not present

## 2018-10-21 DIAGNOSIS — D563 Thalassemia minor: Secondary | ICD-10-CM | POA: Diagnosis present

## 2018-10-21 DIAGNOSIS — B955 Unspecified streptococcus as the cause of diseases classified elsewhere: Secondary | ICD-10-CM | POA: Diagnosis not present

## 2018-10-21 DIAGNOSIS — M8580 Other specified disorders of bone density and structure, unspecified site: Secondary | ICD-10-CM | POA: Diagnosis present

## 2018-10-21 DIAGNOSIS — M4690 Unspecified inflammatory spondylopathy, site unspecified: Secondary | ICD-10-CM | POA: Diagnosis present

## 2018-10-21 DIAGNOSIS — F419 Anxiety disorder, unspecified: Secondary | ICD-10-CM | POA: Diagnosis present

## 2018-10-21 DIAGNOSIS — E1165 Type 2 diabetes mellitus with hyperglycemia: Secondary | ICD-10-CM | POA: Diagnosis present

## 2018-10-21 DIAGNOSIS — E559 Vitamin D deficiency, unspecified: Secondary | ICD-10-CM | POA: Diagnosis present

## 2018-10-21 DIAGNOSIS — I6340 Cerebral infarction due to embolism of unspecified cerebral artery: Secondary | ICD-10-CM

## 2018-10-21 DIAGNOSIS — I6529 Occlusion and stenosis of unspecified carotid artery: Secondary | ICD-10-CM | POA: Diagnosis present

## 2018-10-21 DIAGNOSIS — R29704 NIHSS score 4: Secondary | ICD-10-CM | POA: Diagnosis present

## 2018-10-21 DIAGNOSIS — M545 Low back pain: Secondary | ICD-10-CM | POA: Diagnosis present

## 2018-10-21 DIAGNOSIS — R110 Nausea: Secondary | ICD-10-CM | POA: Diagnosis not present

## 2018-10-21 DIAGNOSIS — I6523 Occlusion and stenosis of bilateral carotid arteries: Secondary | ICD-10-CM | POA: Diagnosis not present

## 2018-10-21 DIAGNOSIS — E0865 Diabetes mellitus due to underlying condition with hyperglycemia: Secondary | ICD-10-CM | POA: Diagnosis present

## 2018-10-21 DIAGNOSIS — E785 Hyperlipidemia, unspecified: Secondary | ICD-10-CM | POA: Diagnosis present

## 2018-10-21 DIAGNOSIS — IMO0002 Reserved for concepts with insufficient information to code with codable children: Secondary | ICD-10-CM | POA: Diagnosis present

## 2018-10-21 DIAGNOSIS — Z7982 Long term (current) use of aspirin: Secondary | ICD-10-CM

## 2018-10-21 DIAGNOSIS — I480 Paroxysmal atrial fibrillation: Secondary | ICD-10-CM | POA: Diagnosis present

## 2018-10-21 DIAGNOSIS — Z7902 Long term (current) use of antithrombotics/antiplatelets: Secondary | ICD-10-CM

## 2018-10-21 DIAGNOSIS — E876 Hypokalemia: Secondary | ICD-10-CM | POA: Diagnosis present

## 2018-10-21 DIAGNOSIS — R0602 Shortness of breath: Secondary | ICD-10-CM | POA: Diagnosis not present

## 2018-10-21 DIAGNOSIS — I63449 Cerebral infarction due to embolism of unspecified cerebellar artery: Secondary | ICD-10-CM | POA: Diagnosis not present

## 2018-10-21 DIAGNOSIS — Z8249 Family history of ischemic heart disease and other diseases of the circulatory system: Secondary | ICD-10-CM

## 2018-10-21 DIAGNOSIS — R410 Disorientation, unspecified: Secondary | ICD-10-CM

## 2018-10-21 DIAGNOSIS — Z9049 Acquired absence of other specified parts of digestive tract: Secondary | ICD-10-CM

## 2018-10-21 HISTORY — DX: Unspecified atrial fibrillation: I48.91

## 2018-10-21 HISTORY — DX: Cerebral infarction, unspecified: I63.9

## 2018-10-21 LAB — CBC
HCT: 37.1 % (ref 36.0–46.0)
Hemoglobin: 11.5 g/dL — ABNORMAL LOW (ref 12.0–15.0)
MCH: 19.5 pg — ABNORMAL LOW (ref 26.0–34.0)
MCHC: 31 g/dL (ref 30.0–36.0)
MCV: 62.8 fL — ABNORMAL LOW (ref 80.0–100.0)
Platelets: 238 10*3/uL (ref 150–400)
RBC: 5.91 MIL/uL — ABNORMAL HIGH (ref 3.87–5.11)
RDW: 19.6 % — ABNORMAL HIGH (ref 11.5–15.5)
WBC: 8.8 10*3/uL (ref 4.0–10.5)
nRBC: 0.2 % (ref 0.0–0.2)

## 2018-10-21 LAB — RAPID URINE DRUG SCREEN, HOSP PERFORMED
Amphetamines: NOT DETECTED
Barbiturates: NOT DETECTED
Benzodiazepines: POSITIVE — AB
Cocaine: NOT DETECTED
Opiates: NOT DETECTED
Tetrahydrocannabinol: NOT DETECTED

## 2018-10-21 LAB — ECHOCARDIOGRAM LIMITED
Height: 65 in
Weight: 3167.57 oz

## 2018-10-21 LAB — PROTIME-INR
INR: 1.2 (ref 0.8–1.2)
Prothrombin Time: 15.1 seconds (ref 11.4–15.2)

## 2018-10-21 LAB — TROPONIN I (HIGH SENSITIVITY)
Troponin I (High Sensitivity): 959 ng/L (ref <18)
Troponin I (High Sensitivity): 860 ng/L (ref <18)

## 2018-10-21 LAB — BASIC METABOLIC PANEL
Anion gap: 14 (ref 5–15)
BUN: 10 mg/dL (ref 8–23)
CO2: 18 mmol/L — ABNORMAL LOW (ref 22–32)
Calcium: 10.3 mg/dL (ref 8.9–10.3)
Chloride: 104 mmol/L (ref 98–111)
Creatinine, Ser: 0.9 mg/dL (ref 0.44–1.00)
GFR calc Af Amer: >60 mL/min (ref >60)
GFR calc non Af Amer: >60 mL/min (ref >60)
Glucose, Bld: 188 mg/dL — ABNORMAL HIGH (ref 70–99)
Potassium: 3.5 mmol/L (ref 3.5–5.1)
Sodium: 136 mmol/L (ref 135–145)

## 2018-10-21 LAB — GLUCOSE, CAPILLARY
Glucose-Capillary: 229 mg/dL — ABNORMAL HIGH (ref 70–99)
Glucose-Capillary: 186 mg/dL — ABNORMAL HIGH (ref 70–99)
Glucose-Capillary: 98 mg/dL (ref 70–99)

## 2018-10-21 LAB — APTT: aPTT: 31 seconds (ref 24–36)

## 2018-10-21 LAB — SARS CORONAVIRUS 2 BY RT PCR (HOSPITAL ORDER, PERFORMED IN ~~LOC~~ HOSPITAL LAB): SARS Coronavirus 2: NEGATIVE

## 2018-10-21 LAB — HEPARIN LEVEL (UNFRACTIONATED): Heparin Unfractionated: <0.1 IU/mL — ABNORMAL LOW (ref 0.30–0.70)

## 2018-10-21 LAB — TSH: TSH: 0.322 u[IU]/mL — ABNORMAL LOW (ref 0.350–4.500)

## 2018-10-21 LAB — T4, FREE: Free T4: 1.78 ng/dL — ABNORMAL HIGH (ref 0.61–1.12)

## 2018-10-21 MED ORDER — SODIUM CHLORIDE 0.9 % IV SOLN
2.0000 g | INTRAVENOUS | Status: DC
  Administered 2018-10-21: 2 g via INTRAVENOUS
  Filled 2018-10-21: qty 2
  Filled 2018-10-21: qty 20

## 2018-10-21 MED ORDER — LORAZEPAM 2 MG/ML IJ SOLN
1.0000 mg | Freq: Once | INTRAMUSCULAR | Status: AC
  Administered 2018-10-21: 07:00:00 1 mg via INTRAVENOUS
  Filled 2018-10-21: qty 1

## 2018-10-21 MED ORDER — ASPIRIN EC 81 MG PO TBEC
81.0000 mg | DELAYED_RELEASE_TABLET | Freq: Every day | ORAL | Status: DC
  Administered 2018-10-23: 81 mg via ORAL
  Filled 2018-10-21 (×2): qty 1

## 2018-10-21 MED ORDER — ACETAMINOPHEN 160 MG/5ML PO SOLN: 650.0000 mg | ORAL | Status: DC | PRN

## 2018-10-21 MED ORDER — HEPARIN BOLUS VIA INFUSION
3000.0000 [IU] | Freq: Once | INTRAVENOUS | Status: AC
  Administered 2018-10-21: 3000 [IU] via INTRAVENOUS
  Filled 2018-10-21: qty 3000

## 2018-10-21 MED ORDER — INSULIN ASPART 100 UNIT/ML ~~LOC~~ SOLN
0.0000 [IU] | Freq: Three times a day (TID) | SUBCUTANEOUS | Status: DC
  Administered 2018-10-21: 3 [IU] via SUBCUTANEOUS
  Administered 2018-10-22: 2 [IU] via SUBCUTANEOUS
  Administered 2018-10-22: 3 [IU] via SUBCUTANEOUS
  Administered 2018-10-23: 5 [IU] via SUBCUTANEOUS

## 2018-10-21 MED ORDER — DILTIAZEM LOAD VIA INFUSION
10.0000 mg | Freq: Once | INTRAVENOUS | Status: AC
  Administered 2018-10-21: 10 mg via INTRAVENOUS
  Filled 2018-10-21: qty 10

## 2018-10-21 MED ORDER — ESCITALOPRAM OXALATE 10 MG PO TABS
20.0000 mg | ORAL_TABLET | Freq: Every day | ORAL | Status: DC
  Administered 2018-10-23: 20 mg via ORAL
  Filled 2018-10-21 (×2): qty 2

## 2018-10-21 MED ORDER — ACETAMINOPHEN 325 MG PO TABS
650.0000 mg | ORAL_TABLET | ORAL | Status: DC | PRN
  Administered 2018-10-22 (×2): 650 mg via ORAL
  Filled 2018-10-21 (×2): qty 2

## 2018-10-21 MED ORDER — SENNOSIDES-DOCUSATE SODIUM 8.6-50 MG PO TABS: 1.0000 | ORAL_TABLET | Freq: Every evening | ORAL | Status: DC | PRN

## 2018-10-21 MED ORDER — INSULIN NPH (HUMAN) (ISOPHANE) 100 UNIT/ML ~~LOC~~ SUSP
20.0000 [IU] | Freq: Every day | SUBCUTANEOUS | Status: DC
  Administered 2018-10-21 – 2018-10-23 (×3): 20 [IU] via SUBCUTANEOUS
  Filled 2018-10-21: qty 10

## 2018-10-21 MED ORDER — GENTAMICIN IV (FOR PTA / DISCHARGE USE ONLY): 80.0000 mg | INTRAVENOUS | Status: DC

## 2018-10-21 MED ORDER — CEFTRIAXONE IV (FOR PTA / DISCHARGE USE ONLY): 2.0000 g | INTRAVENOUS | Status: DC

## 2018-10-21 MED ORDER — CLOPIDOGREL BISULFATE 75 MG PO TABS
75.0000 mg | ORAL_TABLET | Freq: Every day | ORAL | Status: DC
  Filled 2018-10-21: qty 1

## 2018-10-21 MED ORDER — IOHEXOL 350 MG/ML SOLN
75.0000 mL | Freq: Once | INTRAVENOUS | Status: AC | PRN
  Administered 2018-10-21: 75 mL via INTRAVENOUS

## 2018-10-21 MED ORDER — HEPARIN (PORCINE) 25000 UT/250ML-% IV SOLN
1200.0000 [IU]/h | INTRAVENOUS | Status: DC
  Administered 2018-10-21: 1200 [IU]/h via INTRAVENOUS
  Filled 2018-10-21: qty 250

## 2018-10-21 MED ORDER — LORAZEPAM 2 MG/ML IJ SOLN
0.5000 mg | Freq: Once | INTRAMUSCULAR | Status: AC
  Administered 2018-10-21: 0.5 mg via INTRAVENOUS
  Filled 2018-10-21: qty 1

## 2018-10-21 MED ORDER — HEPARIN BOLUS VIA INFUSION
3100.0000 [IU] | Freq: Once | INTRAVENOUS | Status: AC
  Administered 2018-10-21: 3100 [IU] via INTRAVENOUS
  Filled 2018-10-21: qty 3100

## 2018-10-21 MED ORDER — NORTRIPTYLINE HCL 25 MG PO CAPS
25.0000 mg | ORAL_CAPSULE | Freq: Every day | ORAL | Status: DC
  Administered 2018-10-21 – 2018-10-22 (×2): 25 mg via ORAL
  Filled 2018-10-21 (×3): qty 1

## 2018-10-21 MED ORDER — STROKE: EARLY STAGES OF RECOVERY BOOK
Freq: Once | Status: AC
  Administered 2018-10-21: 17:00:00
  Filled 2018-10-21: qty 1

## 2018-10-21 MED ORDER — ETOMIDATE 2 MG/ML IV SOLN
0.1500 mg/kg | Freq: Once | INTRAVENOUS | Status: AC
  Administered 2018-10-21: 13:00:00 10.52 mg via INTRAVENOUS
  Filled 2018-10-21: qty 10

## 2018-10-21 MED ORDER — FENTANYL CITRATE (PF) 100 MCG/2ML IJ SOLN
50.0000 ug | Freq: Once | INTRAMUSCULAR | Status: AC
  Administered 2018-10-21: 50 ug via INTRAVENOUS
  Filled 2018-10-21: qty 2

## 2018-10-21 MED ORDER — ACETAMINOPHEN 650 MG RE SUPP: 650.0000 mg | RECTAL | Status: DC | PRN

## 2018-10-21 MED ORDER — ALPRAZOLAM 0.25 MG PO TABS
0.5000 mg | ORAL_TABLET | Freq: Once | ORAL | Status: AC
  Administered 2018-10-21: 0.5 mg via ORAL
  Filled 2018-10-21: qty 2

## 2018-10-21 MED ORDER — LORAZEPAM 2 MG/ML IJ SOLN
1.0000 mg | Freq: Once | INTRAMUSCULAR | Status: AC
  Administered 2018-10-21: 10:00:00 1 mg via INTRAVENOUS
  Filled 2018-10-21: qty 1

## 2018-10-21 MED ORDER — SODIUM CHLORIDE 0.9% FLUSH: 3.0000 mL | Freq: Once | INTRAVENOUS | Status: DC

## 2018-10-21 MED ORDER — PANTOPRAZOLE SODIUM 40 MG PO TBEC
40.0000 mg | DELAYED_RELEASE_TABLET | Freq: Two times a day (BID) | ORAL | Status: DC
  Administered 2018-10-22 – 2018-10-23 (×2): 40 mg via ORAL
  Filled 2018-10-21 (×3): qty 1

## 2018-10-21 MED ORDER — DILTIAZEM HCL-DEXTROSE 100-5 MG/100ML-% IV SOLN (PREMIX)
5.0000 mg/h | INTRAVENOUS | Status: DC
  Administered 2018-10-21: 5 mg/h via INTRAVENOUS
  Filled 2018-10-21 (×2): qty 100

## 2018-10-21 MED ORDER — LEVOTHYROXINE SODIUM 75 MCG PO TABS
175.0000 ug | ORAL_TABLET | Freq: Every day | ORAL | Status: DC
  Administered 2018-10-22: 175 ug via ORAL
  Filled 2018-10-21: qty 1

## 2018-10-21 MED ORDER — INSULIN ASPART 100 UNIT/ML ~~LOC~~ SOLN: 0.0000 [IU] | Freq: Every day | SUBCUTANEOUS | Status: DC

## 2018-10-21 MED ORDER — HEPARIN (PORCINE) 25000 UT/250ML-% IV SOLN
1100.0000 [IU]/h | INTRAVENOUS | Status: DC
  Administered 2018-10-21: 1200 [IU]/h via INTRAVENOUS
  Administered 2018-10-22: 1100 [IU]/h via INTRAVENOUS
  Administered 2018-10-22 – 2018-10-23 (×2): 1250 [IU]/h via INTRAVENOUS
  Filled 2018-10-21 (×3): qty 250

## 2018-10-21 MED ORDER — SODIUM CHLORIDE 0.9 % IV SOLN
INTRAVENOUS | Status: DC
  Administered 2018-10-21 – 2018-10-22 (×3): via INTRAVENOUS

## 2018-10-21 MED ORDER — GENTAMICIN SULFATE 40 MG/ML IJ SOLN
80.0000 mg | INTRAVENOUS | Status: AC
  Administered 2018-10-21: 80 mg via INTRAVENOUS
  Filled 2018-10-21: qty 2

## 2018-10-21 NOTE — Progress Notes (Addendum)
Patient awake wanting something to drink. and able to follow simple commands to perform swallow screen. Will monitor patient. Tempest Frankland, Bettina Gavia rN

## 2018-10-21 NOTE — ED Notes (Signed)
ED Provider at bedside. 

## 2018-10-21 NOTE — Progress Notes (Signed)
[place] for heparin Indication: atrial fibrillation  Allergies  Allergen Reactions  . Statins Other (See Comments)    Muscle aches    Patient Measurements: Height: 5' 5"  (165.1 cm) Weight: 197 lb 15.6 oz (89.8 kg) IBW/kg (Calculated) : 57 Heparin Dosing Weight: 76.8 kg  Vital Signs: Temp: 98.9 F (37.2 C) (06/30 1350) Temp Source: Oral (06/30 1350) BP: 116/92 (06/30 1717) Pulse Rate: 56 (06/30 1717)  Estimated Creatinine Clearance: 60.7 mL/min (by C-G formula based on SCr of 0.9 mg/dL).   Medical History: Past Medical History:  Diagnosis Date  . Arthritis    "back" (04/22/2018)  . CAD (coronary artery disease)    a. 03/2018 s/p PCI/DES to the RCA (3.0x15 Onyx DES).  . Carotid artery stenosis    Mild  . Chronic lower back pain   . Colon polyps   . Diverticulitis   . Diverticulosis   . Esophageal thickening    seen on pre TAVR CT scan, also questionable cirrhosis. MRI recommended. Will refer to GI  . GERD (gastroesophageal reflux disease)   . Grave's disease   . History of colonic polyps 05/22/2017  . History of hiatal hernia   . Hypertension   . Hypothyroidism   . IBS (irritable bowel syndrome)   . Osteopenia   . Pulmonary nodules    seen on pre TAVR CT. likley benign. no follow up recommended if pt low risk.  . S/P TAVR (transcatheter aortic valve replacement)   . Severe aortic stenosis   . Thalassemia minor   . Type II diabetes mellitus (H[facility])     Assessment: 73yo female c/o SOB associated w/ nausea, found to be in Afib and started on diltiazem, was started on heparin infusion earlier today (infusion was stopped ~8 AM after 3 hrs). Pharmacy is consulted to start heparin for a fib.  Other medical history includes: AS, S/P TAVR in 3/20; hypothyroidism; HTN; pulmonary HTN;  CAD, S/P stent in 12/19; Strept salivarius bacteremia and mitral valve endocarditis (6/20); MVR  Goal of Therapy:  Heparin level 0.3-0.7  units/ml Monitor platelets by anticoagulation protocol: Yes   Plan:  Will give heparin 3100 units IV bolus X1, followed by heparin infusion at 1200 units/hr and monitor heparin levels and CBC. Check heparin level ~8 hrs after initiation of infusion and daily. Monitor for signs/symptoms of bleeding.  Gillermina Hu, PharmD, BCPS, [facility] Clinical Pharmacist 10/21/2018,6:02 PM

## 2018-10-21 NOTE — ED Notes (Addendum)
Patient very anxious, c/o sob and headache. Pt reports hx of anxiety, instructed on slow deep breathing. Asking repetitive questions

## 2018-10-21 NOTE — Telephone Encounter (Signed)
Manuela Schwartz notified to proceed with orders.  Nothing further needed.

## 2018-10-21 NOTE — ED Notes (Signed)
Nurse Navigator communication: Spoke with husband [name], gave detailed history about patients status and history. He reports 16 days of events. 2 1/2 weeks ago fever 103.7, stents and aortic valve replacement with pericarditis. A notification will be sent to Dr. Venora Maples to touch base with him. He was most gracious and the call helped to decrease his anxiety.

## 2018-10-21 NOTE — Progress Notes (Addendum)
[place] for heparin Indication: atrial fibrillation, stroke  Allergies  Allergen Reactions  . Statins Other (See Comments)    Muscle aches    Patient Measurements: Height: 5' 5"  (165.1 cm) Weight: 197 lb 15.6 oz (89.8 kg) IBW/kg (Calculated) : 57 Heparin Dosing Weight: 76.8 kg  Vital Signs: Temp: 98.9 F (37.2 C) (06/30 1350) Temp Source: Oral (06/30 1350) BP: 116/92 (06/30 1717) Pulse Rate: 56 (06/30 1717)  Estimated Creatinine Clearance: 60.7 mL/min (by C-G formula based on SCr of 0.9 mg/dL).   Medical History: Past Medical History:  Diagnosis Date  . Arthritis    "back" (04/22/2018)  . CAD (coronary artery disease)    a. 03/2018 s/p PCI/DES to the RCA (3.0x15 Onyx DES).  . Carotid artery stenosis    Mild  . Chronic lower back pain   . Colon polyps   . Diverticulitis   . Diverticulosis   . Esophageal thickening    seen on pre TAVR CT scan, also questionable cirrhosis. MRI recommended. Will refer to GI  . GERD (gastroesophageal reflux disease)   . Grave's disease   . History of colonic polyps 05/22/2017  . History of hiatal hernia   . Hypertension   . Hypothyroidism   . IBS (irritable bowel syndrome)   . Osteopenia   . Pulmonary nodules    seen on pre TAVR CT. likley benign. no follow up recommended if pt low risk.  . S/P TAVR (transcatheter aortic valve replacement)   . Severe aortic stenosis   . Thalassemia minor   . Type II diabetes mellitus (HCC)     Assessment: 73yo female c/o SOB associated w/ nausea, found to be in Afib and started on diltiazem, was started on heparin infusion earlier today (infusion was stopped ~8 AM after 3 hrs; HL at 11:41 AM was <0.10). Earlier this shift, Pharmacy was consulted to start heparin for a fib (pt rec'd heparin 3100 unit bolus X 1, followed by heparin infusion at 1200 units/hr for a fib for a couple of hrs) Pharmacy is now consulted to dose/monitor heparin per the stroke protocol  (per Neurology, pt likely has new stroke secondary to a fib vs. endocarditis).  Other medical history includes: AS, S/P TAVR in 3/20; hypothyroidism; HTN; pulmonary HTN;  CAD, S/P stent in 12/19; Strept salivarius bacteremia and mitral valve endocarditis (6/20); MVR  Goal of Therapy:  Heparin level 0.3-0.5 units/ml Monitor platelets by anticoagulation protocol: Yes   Plan:  Reduce heparin infusion to 920 units/hr; give no boluses going forward, per stroke protocol.  Check heparin level ~8 hrs after infusion reduction and then daily. Monitor daily CBC and signs/symptoms of bleeding.  Gillermina Hu, PharmD, BCPS, [facility] Clinical Pharmacist 10/21/2018,8:43 PM

## 2018-10-21 NOTE — Telephone Encounter (Signed)
[place] for verbal orders  TCM visit complete

## 2018-10-21 NOTE — Progress Notes (Addendum)
Called husband [name]) informed that patient is in room still slightly confused. Husband asked if he could visit but informed that he cannot at this time. Would like to receive call from Dr. Lorin Mercy. MD paged. Admitted with left upper arm PICC. Drsg saturated. New drsg and caps applied.

## 2018-10-21 NOTE — H&P (Signed)
History and Physical    Breanna Webster EQA:[id number] DOB: [birth year] DOA: 10/21/2018  PCP: [name], NP Consultants:  Melvyn Novas - pulmonology; Loanne Drilling - endocrinology; [place] - GI; [place] - cardiology Patient coming from:  Home - lives with husband; NOK: Kathalene Frames, [phone]  Chief Complaint: SOB  HPI: Breanna Webster is a 73 y.o. female with medical history significant of DM; AS s/p TAVR in 3/20; hypothyroidism; HTN; pulmonary HTN; and CAD s/p stent in 12/19 presenting with SOB. She initially came in for SOB and was found to be in afib with RVR to 170-180 range.  She was delirious and CT showed acute cerebellar infarcts (?related to afib).  Due to recent endocarditis, neurology was concerned about a mycotic aneurysm and suggested STAT CTA head/neck.  She was unable to tolerate imaging and so was given sedation; at the time of my evaluation she was quite somnolent, but apparently still unable to tolerate imaging.  At that point, she was taking in with procedural sedation and given Etomidate to successfully complete the CTA.  She was admitted from 6/12-19 for bacteremia 2* to Strep Salivarius with evidence of mitral valve endocarditis on TEE on 6/1 without obvious TAVR infection; ID consulted and recommended Gent through 6/30 and Rocephin through 7/28 via PICC line with repeat Echo after completion of abx.  Elevated troponin was thought to be due to demand ischemia.  She subsequently had an ER visit on 6/26 where the IV team problem-solved her PICC line, which wasn't working properly.  Her husband reports that she has never had afib before.  She does have a h/o MVR which may require repair.  ED Course:  Presented for SOB, found to have new-onset afib with RVR to 170-180.  Converted to NSR.  Complained of headache and has new cerebellar infarcts on CT.  She has been light-headed, dizzy, off balance - ?PAF.  Also with some delirium.  Given Ativan for mild agitation.  Given  Fentanyl and Ativan and will now likely tolerate CTA.  Neurology consulted and recommended CTA head/neck and MRI brain, concern for ?mycotic aneurysm.  Review of Systems: unable to perform  Past Medical History:  Diagnosis Date   Arthritis    "back" (04/22/2018)   CAD (coronary artery disease)    a. 03/2018 s/p PCI/DES to the RCA (3.0x15 Onyx DES).   Carotid artery stenosis    Mild   Chronic lower back pain    Colon polyps    Diverticulitis    Diverticulosis    Esophageal thickening    seen on pre TAVR CT scan, also questionable cirrhosis. MRI recommended. Will refer to GI   GERD (gastroesophageal reflux disease)    Grave's disease    History of colonic polyps 05/22/2017   History of hiatal hernia    Hypertension    Hypothyroidism    IBS (irritable bowel syndrome)    Osteopenia    Pulmonary nodules    seen on pre TAVR CT. likley benign. no follow up recommended if pt low risk.   S/P TAVR (transcatheter aortic valve replacement)    Severe aortic stenosis    Thalassemia minor    Type II diabetes mellitus ([place])     Past Surgical History:  Procedure Laterality Date   20 HOUR [place] STUDY N/A 03/03/2018   Procedure: 24 HOUR PH STUDY;  Surgeon: Mauri Pole, MD;  Location: [facility];  Service: Endoscopy;  Laterality: N/A;   COLONOSCOPY W/ BIOPSIES AND POLYPECTOMY     CORONARY  ANGIOGRAPHY Right 04/21/2018   Procedure: CORONARY ANGIOGRAPHY (CATH LAB);  Surgeon: [name], MD;  Location: [place] [facility];  Service: Cardiovascular;  Laterality: Right;   CORONARY STENT INTERVENTION N/A 04/22/2018   Procedure: CORONARY STENT INTERVENTION;  Surgeon: [name], MD;  Location: [place] [facility];  Service: Cardiovascular;  Laterality: N/A;   DILATION AND CURETTAGE OF UTERUS     ESOPHAGEAL MANOMETRY N/A 03/03/2018   Procedure: ESOPHAGEAL MANOMETRY (EM);  Surgeon: Mauri Pole, MD;  Location: [facility];  Service: Endoscopy;   Laterality: N/A;   HERNIA REPAIR     HYSTEROSCOPY     fibroids   LAPAROSCOPIC CHOLECYSTECTOMY     LAPAROSCOPY     fibroids   NISSEN FUNDOPLICATION  3903E   RIGHT/LEFT HEART CATH AND CORONARY ANGIOGRAPHY N/A 02/20/2018   Procedure: RIGHT/LEFT HEART CATH AND CORONARY ANGIOGRAPHY;  Surgeon: [name], MD;  Location: [place] [facility];  Service: Cardiovascular;  Laterality: N/A;   TEE WITHOUT CARDIOVERSION N/A 07/08/2018   Procedure: TRANSESOPHAGEAL ECHOCARDIOGRAM (TEE);  Surgeon: Burnell Blanks, MD;  Location: [place];  Service: Open Heart Surgery;  Laterality: N/A;   TEE WITHOUT CARDIOVERSION  10/07/2018   TEE WITHOUT CARDIOVERSION N/A 10/07/2018   Procedure: TRANSESOPHAGEAL ECHOCARDIOGRAM (TEE);  Surgeon: Jerline Pain, MD;  Location: [facility];  Service: Cardiovascular;  Laterality: N/A;   TONSILLECTOMY     TRANSCATHETER AORTIC VALVE REPLACEMENT, TRANSFEMORAL N/A 07/08/2018   Procedure: TRANSCATHETER AORTIC VALVE REPLACEMENT, TRANSFEMORAL;  Surgeon: Burnell Blanks, MD;  Location: [place];  Service: Open Heart Surgery;  Laterality: N/A;    Social History   Socioeconomic History   Marital status: Married    Spouse name: Not on file   Number of children: 2   Years of education: Not on file   Highest education level: Not on file  Occupational History   Occupation: Tree surgeon of the [employer] strain: Not on file   Food insecurity    Worry: Not on file    Inability: Not on file   Transportation needs    Medical: Not on file    Non-medical: Not on file  Tobacco Use   Smoking status: Never Smoker   Smokeless tobacco: Never Used  Substance and Sexual Activity   Alcohol use: No    Alcohol/week: 0.0 standard drinks   Drug use: Never   Sexual activity: Not Currently  Lifestyle   Physical activity    Days per week: Not on file    Minutes per session: Not on file   Stress: Not on  file  Relationships   Social connections    Talks on phone: Not on file    Gets together: Not on file    Attends religious service: Not on file    Active member of club or organization: Not on file    Attends meetings of clubs or organizations: Not on file    Relationship status: Not on file   Intimate partner violence    Fear of current or ex partner: Not on file    Emotionally abused: Not on file    Physically abused: Not on file    Forced sexual activity: Not on file  Other Topics Concern   Not on file  Social History Narrative   Lives with husband in a one story home.     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   Education: college.    Allergies  Allergen Reactions   Statins Other (See Comments)    Muscle aches    Family History  Adopted: Yes  Problem Relation Age of Onset   Healthy Son        x 2   Headache Other        Cluster headaches   Heart failure Mother    Colon cancer Neg Hx    Pancreatic cancer Neg Hx    Rectal cancer Neg Hx    Stomach cancer Neg Hx     Prior to Admission medications   Medication Sig Start Date End Date Taking? Authorizing Provider  acetaminophen (TYLENOL) 500 MG tablet Take 1,000 mg by mouth every 6 (six) hours as needed (for pain.).   Yes [provider]  aspirin 81 MG tablet Take 81 mg by mouth daily.     Yes [provider]  bisoprolol (ZEBETA) 5 MG tablet TAKE 1 TABLET BY MOUTH EVERY DAY Patient taking differently: Take 5 mg by mouth daily.  07/04/18  Yes Nafziger, Tommi Rumps, NP  cefTRIAXone (ROCEPHIN) IVPB Inject 2 g into the vein daily. Indication:  Strep bacteremia with endocarditis Last Day of Therapy:  11/18/18 Labs - Once weekly:  CBC/D and BMP, Labs - Every other week:  ESR and CRP 10/09/18 11/18/18 Yes Hall, Carole N, DO  chlorpheniramine (CHLOR-TRIMETON) 4 MG tablet Take 4 mg by mouth at bedtime.    Yes [provider]    cholecalciferol (VITAMIN D) 1000 units tablet Take 1,000 Units by mouth daily.   Yes [provider]  clopidogrel (PLAVIX) 75 MG tablet Take 1 tablet (75 mg total) by mouth daily with breakfast. 04/24/18  Yes Theora Gianotti, NP  escitalopram (LEXAPRO) 20 MG tablet TAKE 1 [place] Patient taking differently: Take 20 mg by mouth daily.  07/04/18  Yes Nafziger, Tommi Rumps, NP  ferrous sulfate 325 (65 FE) MG tablet Take 1 tablet (325 mg total) by mouth 2 (two) times daily with a meal. 10/09/18  Yes Hall, Carole N, DO  gentamicin (GARAMYCIN) IVPB Inject 80 mg into the vein daily for 12 days. Indication:  Strep bacteremia with endocarditis Last Day of Therapy:  10/21/18 Labs - Sunday/Monday:  CBC/D, BMP, and gentamicin trough. Labs - Thursday:  BMP and gentamicin trough Labs - Every other week:  ESR and CRP 10/09/18 10/21/18 Yes Hall, Carole N, DO  hydrochlorothiazide (HYDRODIURIL) 12.5 MG tablet TAKE 1 TABLET BY MOUTH EVERY DAY Patient taking differently: Take 12.5 mg by mouth daily.  08/27/18  Yes Nafziger, Tommi Rumps, NP  insulin NPH Human (NOVOLIN N RELION) 100 UNIT/ML injection Inject 0.4 mLs (40 Units total) into the skin every morning. And syringes 1/day 10/09/18  Yes Hall, Carole N, DO  losartan (COZAAR) 50 MG tablet TAKE 1 TABLET(50 MG) BY MOUTH DAILY Patient taking differently: Take 50 mg by mouth daily.  10/01/18  Yes Nafziger, Tommi Rumps, NP  Multiple Vitamin (MULITIVITAMIN WITH MINERALS) TABS Take 1 tablet by mouth daily.   Yes [provider]  nitroGLYCERIN (NITROSTAT) 0.4 MG SL tablet Place 1 tablet (0.4 mg total) under the tongue every 5 (five) minutes as needed for chest pain. 04/23/18  Yes Theora Gianotti, NP  nortriptyline (PAMELOR) 25 MG capsule TAKE 1 CAPSULE(25 MG) BY MOUTH AT BEDTIME Patient taking differently: Take 25 mg by mouth at bedtime.  10/01/18  Yes Nafziger, Tommi Rumps, NP  pantoprazole (PROTONIX) 40 MG tablet Take 1 tablet (  40 mg total) by mouth 2  (two) times daily. 10/01/18  Yes [name], [name], MD  sodium chloride 0.9 % Inject 1,000 mLs into the vein daily for 12 days. Infuse over four hours after gentamicin infusion. 10/09/18 10/21/18 Yes Hall, Carole N, DO  SYNTHROID 175 MCG tablet TAKE 1 TABLET BY MOUTH EVERY DAY Patient taking differently: Take 175 mcg by mouth daily before breakfast.  10/02/18  Yes Renato Shin, MD  Insulin Syringe-Needle U-100 (BD INSULIN SYRINGE U/F) 31G X 5/16" 1 ML MISC USE TO INJECT INSULIN ONCE DAILY 03/27/18   Renato Shin, MD  [place] LANCETS 45W MISC USE TO CHECK BLOOD SUGAR TWICE DAILY Patient taking differently: 2 (two) times a day.  02/26/18   Renato Shin, MD  [facility] VERIO test strip USE TO CHECK BLOOD SUGAR TWICE DAILY Patient taking differently: 1 each by Other route 2 (two) times a day.  08/23/17   Renato Shin, MD    Physical Exam: Vitals:   10/21/18 1115 10/21/18 1246 10/21/18 1309 10/21/18 1350  BP: (!) 115/50   139/76  Pulse: 81   71  Resp: (!) 22   20  Temp:   98.9 F (37.2 C) 98.9 F (37.2 C)  TempSrc:   Oral Oral  SpO2: 96% 96%  93%  Weight:      Height:          General:  Obtunded from sedation at the time of my evaluation, but other times delirious and agitated per report  Eyes:  Sleeping comfortably  ENT:  grossly normal lips & tongue  Neck:  no LAD, masses or thyromegaly  Cardiovascular:  Irregularly irregular with generally good rate control, no r/g, 3/6 systolic murmur. No LE edema.   Respiratory:   CTA bilaterally with no wheezes/rales/rhonchi.  Normal respiratory effort.  Abdomen:  soft, NT, ND, NABS  Skin:  no rash or induration seen on limited exam  Musculoskeletal:  grossly normal tone BUE/BLE, good ROM, no bony abnormality  Lower extremity:  No LE edema.  Limited foot exam with no ulcerations.  2+ distal pulses.  Psychiatric:  Obtunded from sedation  Neurologic:  Unable to perform    Radiological Exams on Admission: Ct Angio Head W Or  Wo Contrast  Result Date: 10/21/2018 CLINICAL DATA:  Stroke. History of endocarditis. Rule out mycotic aneurysm. New cerebellar infarcts on CT. EXAM: CT ANGIOGRAPHY HEAD AND NECK TECHNIQUE: Multidetector CT imaging of the head and neck was performed using the standard protocol during bolus administration of intravenous contrast. Multiplanar CT image reconstructions and MIPs were obtained to evaluate the vascular anatomy. Carotid stenosis measurements (when applicable) are obtained utilizing NASCET criteria, using the distal internal carotid diameter as the denominator. CONTRAST:  57m OMNIPAQUE IOHEXOL 350 MG/ML SOLN COMPARISON:  CT head 10/21/2018 FINDINGS: CTA NECK FINDINGS Aortic arch: Atherosclerotic disease in the aortic arch. No aneurysm or dissection. Left vertebral artery origin from the arch. Right carotid system: Mild atherosclerotic disease right carotid bifurcation without significant stenosis. Left carotid system: Mild atherosclerotic disease left carotid bifurcation without significant stenosis Vertebral arteries: Right vertebral artery dominant widely patent. Left vertebral artery origin from the arch and patent to the basilar without significant stenosis. Skeleton: no acute skeletal abnormality. Other neck: Negative Upper chest: Lung apices clear bilaterally. Review of the MIP images confirms the above findings CTA HEAD FINDINGS Anterior circulation: Atherosclerotic calcification in the cavernous carotid bilaterally without significant stenosis. Anterior and middle cerebral arteries patent bilaterally without significant stenosis or aneurysm Posterior circulation:  Both vertebral arteries contribute to the basilar. PICA patent bilaterally. Left AICA patent. Basilar widely patent. Superior cerebellar and posterior cerebral arteries patent bilaterally. Venous sinuses: Normal venous enhancement Anatomic variants: Negative for cerebral aneurysm. Delayed phase: Not performed Review of the MIP images  confirms the above findings IMPRESSION: Negative for intracranial large vessel occlusion. No significant intracranial stenosis Mild atherosclerotic disease in the carotid bifurcation and cavernous carotid bilaterally without hemodynamically significant stenosis Both vertebral arteries patent to the basilar. Right vertebral artery dominant. Electronically Signed   By: [name] M.D.   On: 10/21/2018 13:01   Ct Head Wo Contrast  Result Date: 10/21/2018 CLINICAL DATA:  Acute headache with normal neuro exam EXAM: CT HEAD WITHOUT CONTRAST TECHNIQUE: Contiguous axial images were obtained from the base of the skull through the vertex without intravenous contrast. COMPARISON:  10/04/2018 FINDINGS: Brain: Small bilateral cerebellar infarcts have become apparent since prior. On axial slices there is question of a right insular cortex infarct, but this could also be explained by prominent motion at this level. No hydrocephalus, collection, or visible acute hemorrhage. Vascular: No hyperdense vessel. Skull: Negative Sinuses/Orbits: Clear sinuses IMPRESSION: 1. Small bilateral cerebellar infarcts have occurred since study earlier this month. 2. Equivocal for interval right insular infarct, certainty degraded by prominent motion artifact. Electronically Signed   By: Monte Fantasia M.D.   On: 10/21/2018 05:55   Ct Angio Neck W And/or Wo Contrast  Result Date: 10/21/2018 CLINICAL DATA:  Stroke. History of endocarditis. Rule out mycotic aneurysm. New cerebellar infarcts on CT. EXAM: CT ANGIOGRAPHY HEAD AND NECK TECHNIQUE: Multidetector CT imaging of the head and neck was performed using the standard protocol during bolus administration of intravenous contrast. Multiplanar CT image reconstructions and MIPs were obtained to evaluate the vascular anatomy. Carotid stenosis measurements (when applicable) are obtained utilizing NASCET criteria, using the distal internal carotid diameter as the denominator. CONTRAST:  64m  OMNIPAQUE IOHEXOL 350 MG/ML SOLN COMPARISON:  CT head 10/21/2018 FINDINGS: CTA NECK FINDINGS Aortic arch: Atherosclerotic disease in the aortic arch. No aneurysm or dissection. Left vertebral artery origin from the arch. Right carotid system: Mild atherosclerotic disease right carotid bifurcation without significant stenosis. Left carotid system: Mild atherosclerotic disease left carotid bifurcation without significant stenosis Vertebral arteries: Right vertebral artery dominant widely patent. Left vertebral artery origin from the arch and patent to the basilar without significant stenosis. Skeleton: no acute skeletal abnormality. Other neck: Negative Upper chest: Lung apices clear bilaterally. Review of the MIP images confirms the above findings CTA HEAD FINDINGS Anterior circulation: Atherosclerotic calcification in the cavernous carotid bilaterally without significant stenosis. Anterior and middle cerebral arteries patent bilaterally without significant stenosis or aneurysm Posterior circulation: Both vertebral arteries contribute to the basilar. PICA patent bilaterally. Left AICA patent. Basilar widely patent. Superior cerebellar and posterior cerebral arteries patent bilaterally. Venous sinuses: Normal venous enhancement Anatomic variants: Negative for cerebral aneurysm. Delayed phase: Not performed Review of the MIP images confirms the above findings IMPRESSION: Negative for intracranial large vessel occlusion. No significant intracranial stenosis Mild atherosclerotic disease in the carotid bifurcation and cavernous carotid bilaterally without hemodynamically significant stenosis Both vertebral arteries patent to the basilar. Right vertebral artery dominant. Electronically Signed   By: CFranchot GalloM.D.   On: 10/21/2018 13:01   Dg Chest Portable 1 View  Result Date: 10/21/2018 CLINICAL DATA:  Shortness of breath EXAM: PORTABLE CHEST 1 VIEW COMPARISON:  10/03/2018 FINDINGS: Normal heart size.  Transcatheter aortic valve replacement. Left PICC with tip at the upper  cavoatrial junction. There is no edema, consolidation, effusion, or pneumothorax. Negative mediastinal contours. IMPRESSION: No evidence of acute disease. Electronically Signed   By: Monte Fantasia M.D.   On: 10/21/2018 04:25    EKG: Independently reviewed.   0406 -  Afib with rate 149; LBBB 0711 - NSR with rate 99; LBBB  Labs on Admission: I have personally reviewed the available labs and imaging studies at the time of the admission.  Pertinent labs:   CO2 18 Glucose 188 HS troponin 959, 860; troponin was 0.83 on 6/13, which is consistent WBC 8.8 Hgb 11.5 INR 1.2 COVID negative today and on 6/12  Assessment/Plan Principal Problem:   Atrial fibrillation with RVR (HCC) Active Problems:   Hypothyroidism   Dyslipidemia   Diabetes mellitus due to underlying condition, uncontrolled ([place])   Essential hypertension   S/P TAVR (transcatheter aortic valve replacement)   Bacteremia due to Streptococcus Salivarius   Cerebellar stroke, acute (HCC)   Afib with RVR   -Patient presenting with new-onset afib.  -She was subsequently found to have cerebellar infarcts which appear likely related to PAF -Since the afib onset is unknown, will focus on rate control and searching for the underlying cause at this time. -Will admit to SDU for Diltiazem drip as per protocol   -Elevated HS troponin is likely related to infectious endocarditis and/or afib with RVR with demand ischemia; low suspicion for ACS at this time -Will continue ASA and Plavix for now, but she also needs heparin (per pharmacy)  -Will request Echocardiogram for further evaluation  -Will risk stratify with FLP and HgbA1c; will also order TSH/free T4 and UDS.  -Repeat EKG in AM and plan to consult cardiology at that time. -Heart rate is well controlled. -CHA2DS2-VASc Score is >2 and so patient will need oral anticoagulation.   Acute bilateral cerebellar  strokes -Patient with delirium on admission -CT showed B cerebellar infarcts new since prior admission -CTA negative  -Will admit for further evaluation -Telemetry monitoring -MRI - this may have to happen under anesthesia if her sensorium does not clear, as she required procedural sedation for CTA -Continue ASA and Plavix daily for now -Neurology consult -Will need PT/OT/ST/Nutrition Consults once sensorium clears  Bacteremia/endocarditis from Strep Salivarius -Patient was admitted about 2 weeks ago with this issue -TEE showed apparent infectious endocarditis of the mitral valve without apparent infection of the TAVR valve -She is due to stop [place] tomorrow -Rocephin is scheduled to continue through 7/28 -I have notified Dr. Baxter Flattery of her re-admission and ID will consult -Blood cultures from today are pending -She is due to have repeat echo after completion of antibiotics  HTN -Allow permissive HTN for now -Treat BP only if >220/120, and then with goal of 15% reduction -Hold Cozaar, Bisoprolol, HCTZ and plan to restart in 48-72 hours   HLD -Check FLP -Hold statin for now due to reported allergy of myalgias   DM -Recent A1c shows suboptimal control (8.5 on 6/14) -NPH 20 units daily for now, decreased from 40 due to NPO -Will order moderate-scale SSI   S/p TAVR -Continued on ASA 81 mg and Plavix with h/o DES in 12/19 -She did not require post-procedure anticoagulation  Hypothyroidism -Check TSH -Continue Synthroid at current dose for now     DVT prophylaxis:  Heparin  Code Status: Full - confirmed with ACP documents but she does have a living will Family Communication: None present; I spoke with her husband by telephone Disposition Plan:  Home once clinically improved  Consults called: Neurology; PT/OT/ST/Nutrition; will need cardiology in AM  Admission status: Admit - It is my clinical opinion that admission to INPATIENT is reasonable and necessary because of the  expectation that this patient will require hospital care that crosses at least 2 midnights to treat this condition based on the medical complexity of the problems presented.  Given the aforementioned information, the predictability of an adverse outcome is felt to be significant.    Karmen Bongo MD [facility]   How to contact the [facility] Attending or Consulting provider [place] or covering provider during after hours [place], for this patient?  1. Check the care team in [facility] and look for a) attending/consulting [facility] provider listed and b) the [facility] team listed 2. Log into [email or web address] and use [place]'s universal password to access. If you do not have the password, please contact the hospital operator. 3. Locate the [facility] provider you are looking for under [facility] and page to a number that you can be directly reached. 4. If you still have difficulty reaching the provider, please page the [facility] (Director on Call) for the Hospitalists listed on amion for assistance.   10/21/2018, 5:32 PM

## 2018-10-21 NOTE — Consult Note (Signed)
Neurology Consultation  Reason for Consult: Strokes on CT scan, dizziness Referring Physician: Dr. Lorin Mercy  CC: Dizziness, shortness of breath History is obtained from: Chart, patient  HPI: Breanna Webster is a 73 y.o. female past medical history of diabetes, aortic stenosis status post TAVR in March 2020, hypothyroidism, pulmonary hypertension, hypertension, coronary artery disease status post stenting in December 2019 presented to the ER with worsening shortness of breath for the past 2 weeks.  She had a recent admission for infective endocarditis, discharged from the hospital on 10/10/2018 with ceftriaxone for 6 weeks and gentamicin for 2 weeks-gentamicin ending 10/21/2018 and ceftriaxone end date 10/29/2018 with PICC line inserted. She was evaluated in the emergency room this morning for shortness of breath and confusion. She reports that over the past 2 weeks she has been on shortness of breath and also has been feeling dizzy.  She also reports a headache that has been constant all over her head and appears generally uncomfortable. She denies any focal tingling numbness weakness.  She denies any visual symptoms.  In the emergency room, she was found to be in A. fib with RVR and started on heparin drip. Noncontrast head CT was done that showed bilateral cerebellar subacute infarcts which are new from comparison head CT done earlier this month.  There is also question of a right insular infarct versus motion again age-indeterminate. This case was discussed with me over the phone and had recommended doing CTA to ensure no mycotic aneurysms as she had endocarditis and in cases with endocarditis, anticoagulation is very risky given the risks of mycotic aneurysms.  A CTA head and neck was done and was negative for any obvious mycotic aneurysms and anticoagulation was continued.  I also recommended getting an MRI done but has not been done due to patient's noncooperation at this time.  Neurology consultation  was obtained for further recommendations.   LKW: 2 weeks ago tpa given?: no, outside the window Premorbid modified Rankin scale (mRS): 2  ROS:ROS was performed and is negative except as noted in the HPI.   Past Medical History:  Diagnosis Date  . Arthritis    "back" (04/22/2018)  . CAD (coronary artery disease)    a. 03/2018 s/p PCI/DES to the RCA (3.0x15 Onyx DES).  . Carotid artery stenosis    Mild  . Chronic lower back pain   . Colon polyps   . Diverticulitis   . Diverticulosis   . Esophageal thickening    seen on pre TAVR CT scan, also questionable cirrhosis. MRI recommended. Will refer to GI  . GERD (gastroesophageal reflux disease)   . Grave's disease   . History of colonic polyps 05/22/2017  . History of hiatal hernia   . Hypertension   . Hypothyroidism   . IBS (irritable bowel syndrome)   . Osteopenia   . Pulmonary nodules    seen on pre TAVR CT. likley benign. no follow up recommended if pt low risk.  . S/P TAVR (transcatheter aortic valve replacement)   . Severe aortic stenosis   . Thalassemia minor   . Type II diabetes mellitus ([place])    Family History  Adopted: Yes  Problem Relation Age of Onset  . Healthy Son        x 2  . Headache Other        Cluster headaches  . Heart failure Mother   . Colon cancer Neg Hx   . Pancreatic cancer Neg Hx   . Rectal cancer Neg Hx   .  Stomach cancer Neg Hx    Social History:   reports that she has never smoked. She has never used smokeless tobacco. She reports that she does not drink alcohol or use drugs.  Medications  Current Facility-Administered Medications:  .  0.9 %  sodium chloride infusion, , Intravenous, Continuous, Karmen Bongo, MD, Last Rate: 50 mL/hr at 10/21/18 1600 .  acetaminophen (TYLENOL) tablet 650 mg, 650 mg, Oral, Q4H PRN **OR** acetaminophen (TYLENOL) solution 650 mg, 650 mg, Per Tube, Q4H PRN **OR** acetaminophen (TYLENOL) suppository 650 mg, 650 mg, Rectal, Q4H PRN, Karmen Bongo, MD .   aspirin EC tablet 81 mg, 81 mg, Oral, Daily, Karmen Bongo, MD .  cefTRIAXone (ROCEPHIN) 2 g in sodium chloride 0.9 % 100 mL IVPB, 2 g, Intravenous, Q24H, Karmen Bongo, MD, Last Rate: 200 mL/hr at 10/21/18 1711, 2 g at 10/21/18 1711 .  clopidogrel (PLAVIX) tablet 75 mg, 75 mg, Oral, Q breakfast, Karmen Bongo, MD .  Margrett Rud diltiazem (CARDIZEM) 1 mg/mL load via infusion 10 mg, 10 mg, Intravenous, Once, 10 mg at 10/21/18 0458 **AND** diltiazem (CARDIZEM) 100 mg in dextrose 5% 189m (1 mg/mL) infusion, 5-15 mg/hr, Intravenous, Continuous, YKarmen Bongo MD, Last Rate: 15 mL/hr at 10/21/18 0634, 15 mg/hr at 10/21/18 0634 .  [START ON 10/22/2018] escitalopram (LEXAPRO) tablet 20 mg, 20 mg, Oral, Daily, YKarmen Bongo MD .  heparin ADULT infusion 100 units/mL (25000 units/2514msodium chloride 0.45%), 1,200 Units/hr, Intravenous, Continuous, YaKarmen BongoMD, Last Rate: 12 mL/hr at 10/21/18 1822, 1,200 Units/hr at 10/21/18 1822 .  insulin aspart (novoLOG) injection 0-15 Units, 0-15 Units, Subcutaneous, TID WC, YaKarmen BongoMD, 3 Units at 10/21/18 1729 .  insulin aspart (novoLOG) injection 0-5 Units, 0-5 Units, Subcutaneous, QHS, YaKarmen BongoMD .  insulin NPH Human (NOVOLIN N) injection 20 Units, 20 Units, Subcutaneous, QAC breakfast, YaKarmen BongoMD, 20 Units at 10/21/18 1712 .  levothyroxine (SYNTHROID) tablet 175 mcg, 175 mcg, Oral, QAC breakfast, YaKarmen BongoMD .  nortriptyline (PAMELOR) capsule 25 mg, 25 mg, Oral, QHIvery QualeMD .  [SDerrill MemoN 10/22/2018] pantoprazole (PROTONIX) EC tablet 40 mg, 40 mg, Oral, BID, YaKarmen BongoMD .  senna-docusate (Senokot-S) tablet 1 tablet, 1 tablet, Oral, QHS PRN, YaKarmen BongoMD  Exam: Current vital signs: BP (!) 116/92   Pulse (!) 56   Temp 98.9 F (37.2 C) (Oral)   Resp 19   Ht 5' 5"  (1.651 m)   Wt 89.8 kg   SpO2 98%   BMI 32.94 kg/m  Vital signs in last 24 hours: Temp:  [97.9 F (36.6 C)-98.9 F (37.2  C)] 98.9 F (37.2 C) (06/30 1350) Pulse Rate:  [53-123] 56 (06/30 1717) Resp:  [17-29] 19 (06/30 1717) BP: (112-171)/(50-99) 116/92 (06/30 1717) SpO2:  [93 %-100 %] 98 % (06/30 1717) FiO2 (%):  [28 %] 28 % (06/30 1246) Weight:  [89.8 kg] 89.8 kg (06/30 0407) General: Patient is awake, alert appears uncomfortable due to shortness of breath. HEENT: Normocephalic atraumatic  Lungs: Decreased air entry symmetrically but saturating normally with supplemental oxygen Extremities: Warm well perfused Neurological exam Awake alert oriented x3 Has poor attention concentration and is generally ill-appearing. Repetition is intact.  Comprehension is intact.  Naming is intact. No dysarthria noted. Cranial nerves: Pupils equal round reactive light, extraocular movements intact, visual fields full, face is symmetric, facial sensations intact, tongue and palate midline. Motor exam: Antigravity strength in all 4 extremities without drift. Sensory exam: Decreased to light touch on the right lower extremity  but she says that is her baseline.  No other deficits noted. Coordination: Intact finger-nose-finger testing.  Has extreme difficulty performing heel-knee-shin in both legs. NIH stroke scale-1 for sensory  Labs I have reviewed labs in epic and the results pertinent to this consultation are:  CBC    Component Value Date/Time   WBC 8.8 10/21/2018 0409   RBC 5.91 (H) 10/21/2018 0409   HGB 11.5 (L) 10/21/2018 0409   HGB 10.7 (L) 04/14/2018 1046   HCT 37.1 10/21/2018 0409   HCT 34.1 04/14/2018 1046   PLT 238 10/21/2018 0409   PLT 311 04/14/2018 1046   MCV 62.8 (L) 10/21/2018 0409   MCV 63 (L) 04/14/2018 1046   MCH 19.5 (L) 10/21/2018 0409   MCHC 31.0 10/21/2018 0409   RDW 19.6 (H) 10/21/2018 0409   RDW 16.0 (H) 04/14/2018 1046   LYMPHSABS 0.8 10/03/2018 2136   LYMPHSABS 1.2 02/26/2017 1054   MONOABS 0.8 10/03/2018 2136   EOSABS 0.0 10/03/2018 2136   EOSABS 0.1 02/26/2017 1054   BASOSABS 0.0  10/03/2018 2136   BASOSABS 0.0 02/26/2017 1054    CMP     Component Value Date/Time   NA 136 10/21/2018 0409   NA 136 06/04/2018 1050   K 3.5 10/21/2018 0409   CL 104 10/21/2018 0409   CO2 18 (L) 10/21/2018 0409   GLUCOSE 188 (H) 10/21/2018 0409   GLUCOSE 135 09/08/2008   BUN 10 10/21/2018 0409   BUN 15 06/04/2018 1050   CREATININE 0.90 10/21/2018 0409   CALCIUM 10.3 10/21/2018 0409   PROT 6.0 (L) 10/05/2018 0433   PROT 6.9 02/26/2017 1116   ALBUMIN 2.6 (L) 10/05/2018 0433   ALBUMIN 3.9 02/26/2017 1116   AST 34 10/05/2018 0433   ALT 26 10/05/2018 0433   ALKPHOS 66 10/05/2018 0433   BILITOT 0.9 10/05/2018 0433   BILITOT 0.7 02/26/2017 1116   GFRNONAA >60 10/21/2018 0409   GFRAA >60 10/21/2018 0409    Imaging I have reviewed the images obtained  CT-scan of the brain- bilateral small hypodensities in the cerebellum, new from comparison head CT from earlier this month.  Questionable right insular hypodensity.  Assessment: 73 year old woman with past medical history of diabetes, hypertension, pulmonary hypertension, coronary artery disease status post stenting in December 2019, TAVR due to aortic stenosis done in March 2020, recent diagnosis of bacterial endocarditis on antibiotics, presented for evaluation of worsening shortness of breath, headaches and dizziness which she describes as lightheadedness. CT head concerning for interim development of new areas of hypodensity concerning for subacute ischemic strokes as they were not present in the head CT from earlier this month. Unable to get MRI due to some agitation and noncooperation till this time. Found to be in A. fib with RVR.  Likely new stroke secondary to A. fib versus endocarditis.   Impression: Cardioembolic subacute ischemic strokes  Recommendations: Continue telemetry Management of A. fib with RVR per primary team Heparin-stroke protocol if necessary from an A. fib perspective Repeat echo Frequent  neurochecks A1c Lipid panel PT OT Speech therapy N.p.o. until cleared by speech She needs an MRI, this can be postponed till tomorrow morning when she is more cooperative or more staffing is available because at this time she is not cooperative for an MRI and I would not want to give her sedating medications for the fear of worsening her neurological exam.  Stroke team to continue to follow with you -- Amie Portland, MD [facility] Pager: [phone] If 7pm to  7am, please call on call as listed on AMION.

## 2018-10-21 NOTE — Telephone Encounter (Signed)
Left a message for a return call.

## 2018-10-21 NOTE — Progress Notes (Signed)
Message received from [place], Sharin Grave. Pt is active with HH. Jonnie Finner RN CCM Case Mgmt phone [phone]

## 2018-10-21 NOTE — Progress Notes (Signed)
Patient unable to follow commands enough to proceed with swallow eval will continue to monitor patient. Dekker Verga, Bettina Gavia rN

## 2018-10-21 NOTE — ED Triage Notes (Signed)
Patient with shortness of breath, she states that she is having some nausea with the shortness of breath.  Patient was seen recently for same.  Per husband, pt has been confused.  Patient is CAOx4 at this time.

## 2018-10-21 NOTE — Progress Notes (Signed)
ANTICOAGULATION CONSULT NOTE - Initial Consult  Pharmacy Consult for heparin Indication: atrial fibrillation  Allergies  Allergen Reactions  . Statins Other (See Comments)    Muscle aches    Patient Measurements: Height: 5' 5"  (165.1 cm) Weight: 197 lb 15.6 oz (89.8 kg) IBW/kg (Calculated) : 57 Heparin Dosing Weight: 75kg  Vital Signs: Temp: 97.9 F (36.6 C) (06/30 0355) Temp Source: Oral (06/30 0355) BP: 148/56 (06/30 0355) Pulse Rate: 66 (06/30 0355)  Estimated Creatinine Clearance: 68.3 mL/min (by C-G formula based on SCr of 0.73 mg/dL).   Medical History: Past Medical History:  Diagnosis Date  . Arthritis    "back" (04/22/2018)  . CAD (coronary artery disease)    a. 03/2018 s/p PCI/DES to the RCA (3.0x15 Onyx DES).  . Carotid artery stenosis    Mild  . Chronic lower back pain   . Colon polyps   . Diverticulitis   . Diverticulosis   . Esophageal thickening    seen on pre TAVR CT scan, also questionable cirrhosis. MRI recommended. Will refer to GI  . GERD (gastroesophageal reflux disease)   . Grave's disease   . History of colonic polyps 05/22/2017  . History of hiatal hernia   . Hypertension   . Hypothyroidism   . IBS (irritable bowel syndrome)   . Osteopenia   . Pulmonary nodules    seen on pre TAVR CT. likley benign. no follow up recommended if pt low risk.  . S/P TAVR (transcatheter aortic valve replacement)   . Severe aortic stenosis   . Thalassemia minor   . Type II diabetes mellitus ([facility])     Assessment: 73yo female c/o SOB associated w/ nausea, found to be in Afib and started on diltiazem, now to start heparin.  Goal of Therapy:  Heparin level 0.3-0.7 units/ml Monitor platelets by anticoagulation protocol: Yes   Plan:  Will give heparin 3000 units IV bolus x1 followed by gtt at 1200 units/hr and monitor heparin levels and CBC.  Wynona Neat, PharmD, BCPS  10/21/2018,4:42 AM

## 2018-10-21 NOTE — Progress Notes (Signed)
  Echocardiogram 2D Echocardiogram Limited  has been performed.  Darlina Sicilian M 10/21/2018, 3:24 PM

## 2018-10-21 NOTE — Consult Note (Signed)
[facility]  Total days of antibiotics 14/ gent plus ceftriaxone               Reason for Consult:stroke/endocarditis   Referring Physician: yates  Principal Problem:   Atrial fibrillation with RVR ([facility]) Active Problems:   Hypothyroidism   Dyslipidemia   Diabetes mellitus due to underlying condition, uncontrolled ([facility])   Essential hypertension   S/P TAVR (transcatheter aortic valve replacement)   Bacteremia due to Streptococcus Salivarius   Cerebellar stroke, acute (HCC)    HPI: Breanna Webster is a 73 y.o. female with history of DM, pulmonary htn, CAD s/p PCI in 2019, and hx of AS s/p TAVR in Mar 20, subsequently admitted in June for streptococcal MV native valve endocarditis currently on day 14 of ceftriaxone plus gent but admitted for worsening SOB, HA, dizziness found to be in afib with RVR. NCHCT suggested new areas of hypodensity concerning for embolic phenomenon given being treated for endocarditis. She received sedation for CT imaging thus unable to acquire more information to symptoms leading up to this admission  Echo form 6/16: showed The mitral valve is degenerative. Mitral valve regurgitation is mild to moderate. A moderate pedunculated and mobile vegetation is seen on the anterior mitral leaflet. The vegetation measures 11 mm by 18 mm.  6/12 blood cx + strep salivarius ( PCN 0.5 INTERMEDIATE), repeat blood cx on 6/15 cleared bacteremia   Past Medical History:  Diagnosis Date  . Arthritis    "back" (04/22/2018)  . CAD (coronary artery disease)    a. 03/2018 s/p PCI/DES to the RCA (3.0x15 Onyx DES).  . Carotid artery stenosis    Mild  . Chronic lower back pain   . Colon polyps   . Diverticulitis   . Diverticulosis   . Esophageal thickening    seen on pre TAVR CT scan, also questionable cirrhosis. MRI recommended. Will refer to GI  . GERD (gastroesophageal reflux disease)   . Grave's disease   . History of colonic polyps 05/22/2017   . History of hiatal hernia   . Hypertension   . Hypothyroidism   . IBS (irritable bowel syndrome)   . Osteopenia   . Pulmonary nodules    seen on pre TAVR CT. likley benign. no follow up recommended if pt low risk.  . S/P TAVR (transcatheter aortic valve replacement)   . Severe aortic stenosis   . Thalassemia minor   . Type II diabetes mellitus (HCC)     Allergies:  Allergies  Allergen Reactions  . Statins Other (See Comments)    Muscle aches      MEDICATIONS: . aspirin EC  81 mg Oral Daily  . clopidogrel  75 mg Oral Q breakfast  . [START ON 10/22/2018] escitalopram  20 mg Oral Daily  . insulin aspart  0-15 Units Subcutaneous TID WC  . insulin aspart  0-5 Units Subcutaneous QHS  . insulin NPH Human  20 Units Subcutaneous QAC breakfast  . levothyroxine  175 mcg Oral QAC breakfast  . nortriptyline  25 mg Oral QHS  . [START ON 10/22/2018] pantoprazole  40 mg Oral BID    Social History   Tobacco Use  . Smoking status: Never Smoker  . Smokeless tobacco: Never Used  Substance Use Topics  . Alcohol use: No    Alcohol/week: 0.0 standard drinks  . Drug use: Never    Family History  Adopted: Yes  Problem Relation Age of Onset  . Healthy Son  x 2  . Headache Other        Cluster headaches  . Heart failure Mother   . Colon cancer Neg Hx   . Pancreatic cancer Neg Hx   . Rectal cancer Neg Hx   . Stomach cancer Neg Hx     Review of Systems - unable to obtain since patient is delirious   OBJECTIVE: Temp:  [97.9 F (36.6 C)-98.9 F (37.2 C)] 98.9 F (37.2 C) (06/30 1350) Pulse Rate:  [53-123] 56 (06/30 1717) Resp:  [17-29] 19 (06/30 1717) BP: (112-171)/(50-99) 116/92 (06/30 1717) SpO2:  [93 %-100 %] 98 % (06/30 1717) FiO2 (%):  [28 %] 28 % (06/30 1246) Weight:  [89.8 kg] 89.8 kg (06/30 0407) Physical Exam  Constitutional:  oriented to person, only appears well-developed and well-nourished. No distress.  HENT: [place]/AT, PERRLA, no scleral icterus  Mouth/Throat: Oropharynx is clear and moist. No oropharyngeal exudate.  Cardiovascular: Normal rate, regular rhythm and normal heart sounds. Exam reveals no gallop and no friction rub.  No murmur heard.  Pulmonary/Chest: Effort normal and breath sounds normal. No respiratory distress.  has no wheezes.  Neck = supple, no nuchal rigidity Abdominal: Soft. Bowel sounds are normal.  exhibits no distension. There is no tenderness.  Lymphadenopathy: no cervical adenopathy. No axillary adenopathy Neurological: alert and oriented to person, only. Moving all extremities but does not follow command Skin: Skin is warm and dry. No rash noted. No erythema.      LABS: Results for orders placed or performed during the hospital encounter of 10/21/18 (from the past 48 hour(s))  Basic metabolic panel     Status: Abnormal   Collection Time: 10/21/18  4:09 AM  Result Value Ref Range   Sodium 136 135 - 145 mmol/L   Potassium 3.5 3.5 - 5.1 mmol/L   Chloride 104 98 - 111 mmol/L   CO2 18 (L) 22 - 32 mmol/L   Glucose, Bld 188 (H) 70 - 99 mg/dL   BUN 10 8 - 23 mg/dL   Creatinine, Ser 0.90 0.44 - 1.00 mg/dL   Calcium 10.3 8.9 - 10.3 mg/dL   GFR calc non Af Amer >60 >60 mL/min   GFR calc Af Amer >60 >60 mL/min   Anion gap 14 5 - 15    Comment: Performed at [facility], [place] [address], [place], [place] [address]  CBC     Status: Abnormal   Collection Time: 10/21/18  4:09 AM  Result Value Ref Range   WBC 8.8 4.0 - 10.5 K/uL   RBC 5.91 (H) 3.87 - 5.11 MIL/uL   Hemoglobin 11.5 (L) 12.0 - 15.0 g/dL   HCT 37.1 36.0 - 46.0 %   MCV 62.8 (L) 80.0 - 100.0 fL   MCH 19.5 (L) 26.0 - 34.0 pg   MCHC 31.0 30.0 - 36.0 g/dL   RDW 19.6 (H) 11.5 - 15.5 %   Platelets 238 150 - 400 K/uL    Comment: REPEATED TO VERIFY   nRBC 0.2 0.0 - 0.2 %    Comment: Performed at [facility], [place] [address] [place], [place] [address]  Troponin I (High Sensitivity)     Status: Abnormal   Collection Time: 10/21/18  4:09 AM   Result Value Ref Range   Troponin I (High Sensitivity) 959 (HH) <18 ng/L    Comment: CRITICAL RESULT CALLED TO, READ BACK BY AND VERIFIED WITH: B Marina Gravel [phone] [id number] WILDERK (NOTE) Elevated high sensitivity troponin I (hsTnI) values and  significant  changes across serial measurements may suggest ACS but many other  chronic and acute conditions are known to elevate hsTnI results.  Refer to the Links section for chest pain algorithms and additional  guidance. Performed at [facility], [place] [address], [place], [place] [address]   Protime-INR     Status: None   Collection Time: 10/21/18  4:44 AM  Result Value Ref Range   Prothrombin Time 15.1 11.4 - 15.2 seconds   INR 1.2 0.8 - 1.2    Comment: (NOTE) INR goal varies based on device and disease states. Performed at [facility], [place] [address], [place], [place] [address]   APTT     Status: None   Collection Time: 10/21/18  4:44 AM  Result Value Ref Range   aPTT 31 24 - 36 seconds    Comment: Performed at [facility] [address] [place], [place] [address]  Troponin I (High Sensitivity)     Status: Abnormal   Collection Time: 10/21/18  5:32 AM  Result Value Ref Range   Troponin I (High Sensitivity) 860 (HH) <18 ng/L    Comment: CRITICAL VALUE NOTED.  VALUE IS CONSISTENT WITH PREVIOUSLY REPORTED AND CALLED VALUE. (NOTE) Elevated high sensitivity troponin I (hsTnI) values and significant  changes across serial measurements may suggest ACS but many other  chronic and acute conditions are known to elevate hsTnI results.  Refer to the Links section for chest pain algorithms and additional  guidance. Performed at [facility], [place] [address] [place], [place] [address]   SARS Coronavirus 2 (CEPHEID - Performed in [facility] hospital lab), Hosp Order     Status: None   Collection Time: 10/21/18  6:04 AM   Specimen: Nasopharyngeal Swab  Result Value Ref Range   SARS Coronavirus 2 NEGATIVE NEGATIVE     Comment: (NOTE) If result is NEGATIVE SARS-CoV-2 target nucleic acids are NOT DETECTED. The SARS-CoV-2 RNA is generally detectable in upper and lower  respiratory specimens during the acute phase of infection. The lowest  concentration of SARS-CoV-2 viral copies this assay can detect is 250  copies / mL. A negative result does not preclude SARS-CoV-2 infection  and should not be used as the sole basis for treatment or other  patient management decisions.  A negative result may occur with  improper specimen collection / handling, submission of specimen other  than nasopharyngeal swab, presence of viral mutation(s) within the  areas targeted by this assay, and inadequate number of viral copies  (<250 copies / mL). A negative result must be combined with clinical  observations, patient history, and epidemiological information. If result is POSITIVE SARS-CoV-2 target nucleic acids are DETECTED. The SARS-CoV-2 RNA is generally detectable in upper and lower  respiratory specimens dur ing the acute phase of infection.  Positive  results are indicative of active infection with SARS-CoV-2.  Clinical  correlation with patient history and other diagnostic information is  necessary to determine patient infection status.  Positive results do  not rule out bacterial infection or co-infection with other viruses. If result is PRESUMPTIVE POSTIVE SARS-CoV-2 nucleic acids MAY BE PRESENT.   A presumptive positive result was obtained on the submitted specimen  and confirmed on repeat testing.  While 2019 novel coronavirus  (SARS-CoV-2) nucleic acids may be present in the submitted sample  additional confirmatory testing may be necessary for epidemiological  and / or clinical management purposes  to differentiate between  SARS-CoV-2 and  other Sarbecovirus currently known to infect humans.  If clinically indicated additional testing with an alternate test  methodology [phone]) is advised. The  SARS-CoV-2 RNA is generally  detectable in upper and lower respiratory sp ecimens during the acute  phase of infection. The expected result is Negative. Fact Sheet for Patients:  StrictlyIdeas.no Fact Sheet for Healthcare Providers: BankingDealers.co.za This test is not yet approved or cleared by the [place] FDA and has been authorized for detection and/or diagnosis of SARS-CoV-2 by FDA under an Emergency Use Authorization (EUA).  This EUA will remain in effect (meaning this test can be used) for the duration of the COVID-19 declaration under Section 564(b)(1) of the Act, 21 U.S.C. section 360bbb-3(b)(1), unless the authorization is terminated or revoked sooner. Performed at [facility], [place] [address], [place], [place] [address]   Heparin level (unfractionated)     Status: Abnormal   Collection Time: 10/21/18 11:41 AM  Result Value Ref Range   Heparin Unfractionated <0.10 (L) 0.30 - 0.70 IU/mL    Comment: (NOTE) If heparin results are below expected values, and patient dosage has  been confirmed, suggest follow up testing of antithrombin III levels. Performed at [facility], [place] [address] [place], [place] [address]   Glucose, capillary     Status: Abnormal   Collection Time: 10/21/18  2:48 PM  Result Value Ref Range   Glucose-Capillary 229 (H) 70 - 99 mg/dL  TSH     Status: Abnormal   Collection Time: 10/21/18  3:30 PM  Result Value Ref Range   TSH 0.322 (L) 0.350 - 4.500 uIU/mL    Comment: Performed by a 3rd Generation assay with a functional sensitivity of <=0.01 uIU/mL. Performed at [facility], [place] [address] [place], [place] [address]   T4, free     Status: Abnormal   Collection Time: 10/21/18  3:30 PM  Result Value Ref Range   Free T4 1.78 (H) 0.61 - 1.12 ng/dL    Comment: (NOTE) Biotin ingestion may interfere with free T4 tests. If the results are inconsistent with the TSH level, previous  test results, or the clinical presentation, then consider biotin interference. If needed, order repeat testing after stopping biotin. Performed at [facility], [place] [address] [place], [place] [address]   Glucose, capillary     Status: Abnormal   Collection Time: 10/21/18  5:16 PM  Result Value Ref Range   Glucose-Capillary 186 (H) 70 - 99 mg/dL  Urine rapid drug screen (hosp performed)not at [facility]     Status: Abnormal   Collection Time: 10/21/18  5:22 PM  Result Value Ref Range   Opiates NONE DETECTED NONE DETECTED   Cocaine NONE DETECTED NONE DETECTED   Benzodiazepines POSITIVE (A) NONE DETECTED   Amphetamines NONE DETECTED NONE DETECTED   Tetrahydrocannabinol NONE DETECTED NONE DETECTED   Barbiturates NONE DETECTED NONE DETECTED    Comment: (NOTE) DRUG SCREEN FOR MEDICAL PURPOSES ONLY.  IF CONFIRMATION IS NEEDED FOR ANY PURPOSE, NOTIFY LAB WITHIN 5 DAYS. LOWEST DETECTABLE LIMITS FOR URINE DRUG SCREEN Drug Class                     Cutoff (ng/mL) Amphetamine and metabolites    1000 Barbiturate and metabolites    200 Benzodiazepine                 846 Tricyclics and metabolites     300 Opiates and metabolites  300 Cocaine and metabolites        300 THC                            50 Performed at [facility], [place] [address] [place], [place] [address]     MICRO: reviewed IMAGING: Ct Angio Head W Or Wo Contrast  Result Date: 10/21/2018 CLINICAL DATA:  Stroke. History of endocarditis. Rule out mycotic aneurysm. New cerebellar infarcts on CT. EXAM: CT ANGIOGRAPHY HEAD AND NECK TECHNIQUE: Multidetector CT imaging of the head and neck was performed using the standard protocol during bolus administration of intravenous contrast. Multiplanar CT image reconstructions and MIPs were obtained to evaluate the vascular anatomy. Carotid stenosis measurements (when applicable) are obtained utilizing NASCET criteria, using the distal internal carotid diameter as the  denominator. CONTRAST:  3m OMNIPAQUE IOHEXOL 350 MG/ML SOLN COMPARISON:  CT head 10/21/2018 FINDINGS: CTA NECK FINDINGS Aortic arch: Atherosclerotic disease in the aortic arch. No aneurysm or dissection. Left vertebral artery origin from the arch. Right carotid system: Mild atherosclerotic disease right carotid bifurcation without significant stenosis. Left carotid system: Mild atherosclerotic disease left carotid bifurcation without significant stenosis Vertebral arteries: Right vertebral artery dominant widely patent. Left vertebral artery origin from the arch and patent to the basilar without significant stenosis. Skeleton: no acute skeletal abnormality. Other neck: Negative Upper chest: Lung apices clear bilaterally. Review of the MIP images confirms the above findings CTA HEAD FINDINGS Anterior circulation: Atherosclerotic calcification in the cavernous carotid bilaterally without significant stenosis. Anterior and middle cerebral arteries patent bilaterally without significant stenosis or aneurysm Posterior circulation: Both vertebral arteries contribute to the basilar. PICA patent bilaterally. Left AICA patent. Basilar widely patent. Superior cerebellar and posterior cerebral arteries patent bilaterally. Venous sinuses: Normal venous enhancement Anatomic variants: Negative for cerebral aneurysm. Delayed phase: Not performed Review of the MIP images confirms the above findings IMPRESSION: Negative for intracranial large vessel occlusion. No significant intracranial stenosis Mild atherosclerotic disease in the carotid bifurcation and cavernous carotid bilaterally without hemodynamically significant stenosis Both vertebral arteries patent to the basilar. Right vertebral artery dominant. Electronically Signed   By: CFranchot GalloM.D.   On: 10/21/2018 13:01   Ct Head Wo Contrast  Result Date: 10/21/2018 CLINICAL DATA:  Acute headache with normal neuro exam EXAM: CT HEAD WITHOUT CONTRAST TECHNIQUE:  Contiguous axial images were obtained from the base of the skull through the vertex without intravenous contrast. COMPARISON:  10/04/2018 FINDINGS: Brain: Small bilateral cerebellar infarcts have become apparent since prior. On axial slices there is question of a right insular cortex infarct, but this could also be explained by prominent motion at this level. No hydrocephalus, collection, or visible acute hemorrhage. Vascular: No hyperdense vessel. Skull: Negative Sinuses/Orbits: Clear sinuses IMPRESSION: 1. Small bilateral cerebellar infarcts have occurred since study earlier this month. 2. Equivocal for interval right insular infarct, certainty degraded by prominent motion artifact. Electronically Signed   By: JMonte FantasiaM.D.   On: 10/21/2018 05:55   Ct Angio Neck W And/or Wo Contrast  Result Date: 10/21/2018 CLINICAL DATA:  Stroke. History of endocarditis. Rule out mycotic aneurysm. New cerebellar infarcts on CT. EXAM: CT ANGIOGRAPHY HEAD AND NECK TECHNIQUE: Multidetector CT imaging of the head and neck was performed using the standard protocol during bolus administration of intravenous contrast. Multiplanar CT image reconstructions and MIPs were obtained to evaluate the vascular anatomy. Carotid stenosis measurements (when applicable) are obtained utilizing NASCET criteria, using the  distal internal carotid diameter as the denominator. CONTRAST:  19m OMNIPAQUE IOHEXOL 350 MG/ML SOLN COMPARISON:  CT head 10/21/2018 FINDINGS: CTA NECK FINDINGS Aortic arch: Atherosclerotic disease in the aortic arch. No aneurysm or dissection. Left vertebral artery origin from the arch. Right carotid system: Mild atherosclerotic disease right carotid bifurcation without significant stenosis. Left carotid system: Mild atherosclerotic disease left carotid bifurcation without significant stenosis Vertebral arteries: Right vertebral artery dominant widely patent. Left vertebral artery origin from the arch and patent to the  basilar without significant stenosis. Skeleton: no acute skeletal abnormality. Other neck: Negative Upper chest: Lung apices clear bilaterally. Review of the MIP images confirms the above findings CTA HEAD FINDINGS Anterior circulation: Atherosclerotic calcification in the cavernous carotid bilaterally without significant stenosis. Anterior and middle cerebral arteries patent bilaterally without significant stenosis or aneurysm Posterior circulation: Both vertebral arteries contribute to the basilar. PICA patent bilaterally. Left AICA patent. Basilar widely patent. Superior cerebellar and posterior cerebral arteries patent bilaterally. Venous sinuses: Normal venous enhancement Anatomic variants: Negative for cerebral aneurysm. Delayed phase: Not performed Review of the MIP images confirms the above findings IMPRESSION: Negative for intracranial large vessel occlusion. No significant intracranial stenosis Mild atherosclerotic disease in the carotid bifurcation and cavernous carotid bilaterally without hemodynamically significant stenosis Both vertebral arteries patent to the basilar. Right vertebral artery dominant. Electronically Signed   By: CFranchot GalloM.D.   On: 10/21/2018 13:01   Dg Chest Portable 1 View  Result Date: 10/21/2018 CLINICAL DATA:  Shortness of breath EXAM: PORTABLE CHEST 1 VIEW COMPARISON:  10/03/2018 FINDINGS: Normal heart size. Transcatheter aortic valve replacement. Left PICC with tip at the upper cavoatrial junction. There is no edema, consolidation, effusion, or pneumothorax. Negative mediastinal contours. IMPRESSION: No evidence of acute disease. Electronically Signed   By: JMonte FantasiaM.D.   On: 10/21/2018 04:25    Assessment/Plan: [id number] with hx of TAVR but recently being treated for native MV streptococcal endocarditis admitted for shortness of breath from AFIB with RVR and confusion found to have subacute embolic stroke.  Streptococcal endocarditis = last dose of gent  today. Continue on ceftriaxone 2gm IV Q 12 which would give CNS penetration if mycotic aneurysm. Agree with neuro that brain mri would be useful. Currently just finished 2 wk of 6 wk course of iv abtx.   Subacute embolic stroke = sequelae of endocarditis likely.  Therapeutic drug monitoring = kidney function intact after finishing 2 wk of gentamicin  Delirium = will see how her mental status improves tomorrow

## 2018-10-21 NOTE — ED Notes (Addendum)
Patient assisted to call her husband to update him on her plan of care. This RN spoke with patient's husband upon her request.

## 2018-10-21 NOTE — Progress Notes (Addendum)
Patient arrived fromED, patient confused trying to climb out of bed. Patient alert to self. Patient not able to follow commands at this time. Patient with bruising to B upper extremities. Patient also with circular white areas to inside aspect of  Labia. Patient placed on monitor and ccmd made aware vital signs obtained. Sadye Kiernan, Bettina Gavia rN

## 2018-10-21 NOTE — ED Notes (Signed)
Nurse Navigator communication: Husband has been provided with room number and unit contact.  The writer spoke with Dr. Lorin Mercy who plans to give him a call.

## 2018-10-21 NOTE — ED Provider Notes (Addendum)
[facility] EMERGENCY DEPARTMENT Provider Note   CSN: [id number] Arrival date & time: 10/21/18  0344    History   Chief Complaint Chief Complaint  Patient presents with  . Shortness of Breath    HPI DEZIAH RENWICK is a 73 y.o. female.     Patient presents to the emergency department for evaluation of shortness of breath.  She reports that she has not felt well for several days.  She is not sure exactly when the shortness of breath started.  She is not experiencing any chest pain.  She does report that she feels dizzy.  Patient reports that she has a history of anxiety and is extremely anxious now.  She is also complaining of a headache.  Headache is diffuse, no alleviating or exacerbating factors.     Past Medical History:  Diagnosis Date  . Arthritis    "back" (04/22/2018)  . CAD (coronary artery disease)    a. 03/2018 s/p PCI/DES to the RCA (3.0x15 Onyx DES).  . Carotid artery stenosis    Mild  . Chronic lower back pain   . Colon polyps   . Diverticulitis   . Diverticulosis   . Esophageal thickening    seen on pre TAVR CT scan, also questionable cirrhosis. MRI recommended. Will refer to GI  . GERD (gastroesophageal reflux disease)   . Grave's disease   . History of colonic polyps 05/22/2017  . History of hiatal hernia   . Hypertension   . Hypothyroidism   . IBS (irritable bowel syndrome)   . Osteopenia   . Pulmonary nodules    seen on pre TAVR CT. likley benign. no follow up recommended if pt low risk.  . S/P TAVR (transcatheter aortic valve replacement)   . Severe aortic stenosis   . Thalassemia minor   . Type II diabetes mellitus [facility])     Patient Active Problem List   Diagnosis Date Noted  . Endocarditis of mitral valve 10/07/2018  . Bacteremia due to Streptococcus Salivarius 10/07/2018  . Sepsis ([place]) 10/04/2018  . Acute metabolic encephalopathy 46/65/9935  . Severe aortic stenosis 07/08/2018  . S/P TAVR (transcatheter aortic  valve replacement) 07/08/2018  . Esophageal thickening   . CAD in native artery 04/22/2018  . Gastroesophageal reflux disease   . Pulmonary hypertension ([place]) 02/21/2018  . Essential hypertension 07/15/2017  . History of colonic polyps 05/22/2017  . Elevated liver function tests 12/05/2016  . Thalassemia minor 05/29/2016  . Left bundle branch block 12/06/2015  . Upper airway cough syndrome 10/14/2015  . Myalgia 02/17/2014  . Carotid artery stenosis 06/05/2013  . Eustachian tube dysfunction 05/07/2013  . Neuropathy of leg 03/07/2012  . Anemia, unspecified 01/31/2012  . Hot flashes 08/24/2010  . Diabetes mellitus due to underlying condition, uncontrolled ([place]) 06/30/2010  . Obesity 12/21/2009  . Vitamin D deficiency 03/10/2009  . Hypothyroidism 12/13/2008  . Dyslipidemia 12/13/2008  . Anxiety state 12/13/2008  . Other specified disorders of bladder 12/13/2008    Past Surgical History:  Procedure Laterality Date  . [place]-Pe[place] STUDY N/A 03/03/2018   Procedure: [place] STUDY;  Surgeon: Mauri Pole, MD;  Location: [facility] ENDOSCOPY;  Service: Endoscopy;  Laterality: N/A;  . COLONOSCOPY W/ BIOPSIES AND POLYPECTOMY    . CORONARY ANGIOGRAPHY Right 04/21/2018   Procedure: CORONARY ANGIOGRAPHY (CATH LAB);  Surgeon: [name], MD;  Location: [place] [facility] CV LAB;  Service: Cardiovascular;  Laterality: Right;  . CORONARY STENT INTERVENTION N/A 04/22/2018  Procedure: CORONARY STENT INTERVENTION;  Surgeon: [name], MD;  Location: [place] CV LAB;  Service: Cardiovascular;  Laterality: N/A;  . DILATION AND CURETTAGE OF UTERUS    . ESOPHAGEAL MANOMETRY N/A 03/03/2018   Procedure: ESOPHAGEAL MANOMETRY (EM);  Surgeon: Mauri Pole, MD;  Location: [facility] ENDOSCOPY;  Service: Endoscopy;  Laterality: N/A;  . HERNIA REPAIR    . HYSTEROSCOPY     fibroids  . LAPAROSCOPIC CHOLECYSTECTOMY    . LAPAROSCOPY     fibroids  . NISSEN FUNDOPLICATION  3825K  . RIGHT/LEFT HEART CATH  AND CORONARY ANGIOGRAPHY N/A 02/20/2018   Procedure: RIGHT/LEFT HEART CATH AND CORONARY ANGIOGRAPHY;  Surgeon: [name], MD;  Location: [place] CV LAB;  Service: Cardiovascular;  Laterality: N/A;  . TEE WITHOUT CARDIOVERSION N/A 07/08/2018   Procedure: TRANSESOPHAGEAL ECHOCARDIOGRAM (TEE);  Surgeon: Burnell Blanks, MD;  Location: [facility];  Service: Open Heart Surgery;  Laterality: N/A;  . TEE WITHOUT CARDIOVERSION  10/07/2018  . TEE WITHOUT CARDIOVERSION N/A 10/07/2018   Procedure: TRANSESOPHAGEAL ECHOCARDIOGRAM (TEE);  Surgeon: Jerline Pain, MD;  Location: [facility] ENDOSCOPY;  Service: Cardiovascular;  Laterality: N/A;  . TONSILLECTOMY    . TRANSCATHETER AORTIC VALVE REPLACEMENT, TRANSFEMORAL N/A 07/08/2018   Procedure: TRANSCATHETER AORTIC VALVE REPLACEMENT, TRANSFEMORAL;  Surgeon: Burnell Blanks, MD;  Location: [place] [facility];  Service: Open Heart Surgery;  Laterality: N/A;     OB History    Gravida  2   Para  2   Term  2   Preterm      AB      Living        SAB      TAB      Ectopic      Multiple      Live Births               Home Medications    Prior to Admission medications   Medication Sig Start Date End Date Taking? Authorizing Provider  acetaminophen (TYLENOL) 500 MG tablet Take 1,000 mg by mouth every 6 (six) hours as needed (for pain.).    [provider]  aspirin 81 MG tablet Take 81 mg by mouth daily.      [provider]  bisoprolol (ZEBETA) 5 MG tablet TAKE 1 TABLET BY MOUTH EVERY DAY 07/04/18   Nafziger, Tommi Rumps, NP  cefTRIAXone (ROCEPHIN) IVPB Inject 2 g into the vein daily. Indication:  Strep bacteremia with endocarditis Last Day of Therapy:  11/18/18 Labs - Once weekly:  CBC/D and BMP, Labs - Every other week:  ESR and CRP 10/09/18 11/18/18  Kayleen Memos, DO  chlorpheniramine (CHLOR-TRIMETON) 4 MG tablet Take 4 mg by mouth at bedtime.     [provider]  cholecalciferol (VITAMIN D) 1000 units tablet Take 1,000  Units by mouth daily.    [provider]  clopidogrel (PLAVIX) 75 MG tablet Take 1 tablet (75 mg total) by mouth daily with breakfast. 04/24/18   Theora Gianotti, NP  escitalopram (LEXAPRO) 20 MG tablet TAKE 1 TABLET BY MOUTH EVERY DAY 07/04/18   Nafziger, Tommi Rumps, NP  ferrous sulfate 325 (65 FE) MG tablet Take 1 tablet (325 mg total) by mouth 2 (two) times daily with a meal. 10/09/18   Kayleen Memos, DO  gentamicin (GARAMYCIN) IVPB Inject 80 mg into the vein daily for 12 days. Indication:  Strep bacteremia with endocarditis Last Day of Therapy:  10/21/18 Labs - Sunday/Monday:  CBC/D, BMP, and gentamicin trough.  Labs - Thursday:  BMP and gentamicin trough Labs - Every other week:  ESR and CRP 10/09/18 10/21/18  Kayleen Memos, DO  hydrochlorothiazide (HYDRODIURIL) 12.5 MG tablet TAKE 1 TABLET BY MOUTH EVERY DAY 08/27/18   Nafziger, Tommi Rumps, NP  insulin NPH Human (NOVOLIN N RELION) 100 UNIT/ML injection Inject 0.4 mLs (40 Units total) into the skin every morning. And syringes 1/day 10/09/18   Kayleen Memos, DO  Insulin Syringe-Needle U-100 (BD INSULIN SYRINGE U/F) 31G X 5/16" 1 ML MISC USE TO INJECT INSULIN ONCE DAILY 03/27/18   Renato Shin, MD  losartan (COZAAR) 50 MG tablet TAKE 1 TABLET(50 MG) BY MOUTH DAILY Patient taking differently: Take 50 mg by mouth daily.  10/01/18   Nafziger, Tommi Rumps, NP  Multiple Vitamin (MULITIVITAMIN WITH MINERALS) TABS Take 1 tablet by mouth daily.    [provider]  nitroGLYCERIN (NITROSTAT) 0.4 MG SL tablet Place 1 tablet (0.4 mg total) under the tongue every 5 (five) minutes as needed for chest pain. 04/23/18   Theora Gianotti, NP  nortriptyline (PAMELOR) 25 MG capsule TAKE 1 CAPSULE(25 MG) BY MOUTH AT BEDTIME Patient taking differently: Take 25 mg by mouth at bedtime.  10/01/18   Nafziger, Tommi Rumps, NP  ONETOUCH DELICA LANCETS 67E MISC USE TO CHECK BLOOD SUGAR TWICE DAILY Patient taking differently: 2 (two) times a day.  02/26/18   Renato Shin, MD   [facility] VERIO test strip USE TO CHECK BLOOD SUGAR TWICE DAILY Patient taking differently: 1 each by Other route 2 (two) times a day.  08/23/17   Renato Shin, MD  pantoprazole (PROTONIX) 40 MG tablet Take 1 tablet (40 mg total) by mouth 2 (two) times daily. 10/01/18   [name], [name], MD  sodium chloride 0.9 % Inject 1,000 mLs into the vein daily for 12 days. Infuse over four hours after gentamicin infusion. 10/09/18 10/21/18  Kayleen Memos, DO  SYNTHROID 175 MCG tablet TAKE 1 TABLET BY MOUTH EVERY DAY 10/02/18   Renato Shin, MD    Family History Family History  Adopted: Yes  Problem Relation Age of Onset  . Healthy Son        x 2  . Headache Other        Cluster headaches  . Heart failure Mother   . Colon cancer Neg Hx   . Pancreatic cancer Neg Hx   . Rectal cancer Neg Hx   . Stomach cancer Neg Hx     Social History Social History   Tobacco Use  . Smoking status: Never Smoker  . Smokeless tobacco: Never Used  Substance Use Topics  . Alcohol use: No    Alcohol/week: 0.0 standard drinks  . Drug use: Never     Allergies   Statins   Review of Systems Review of Systems  Respiratory: Positive for shortness of breath.   Neurological: Positive for dizziness and headaches.  Psychiatric/Behavioral: The patient is nervous/anxious.   All other systems reviewed and are negative.    Physical Exam Updated Vital Signs BP (!) 133/99   Pulse 100   Temp 97.9 F (36.6 C) (Oral)   Resp (!) 21   Ht 5' 5"  (1.651 m)   Wt 89.8 kg   SpO2 98%   BMI 32.94 kg/m   Physical Exam Vitals signs and nursing note reviewed.  Constitutional:      General: She is not in acute distress.    Appearance: Normal appearance. She is well-developed.  HENT:     Head: Normocephalic  and atraumatic.     Right Ear: Hearing normal.     Left Ear: Hearing normal.     Nose: Nose normal.  Eyes:     Conjunctiva/sclera: Conjunctivae normal.     Pupils: Pupils are equal, round, and reactive to  light.  Neck:     Musculoskeletal: Normal range of motion and neck supple.  Cardiovascular:     Rate and Rhythm: Tachycardia present. Rhythm irregular.     Heart sounds: S1 normal and S2 normal. No murmur. No friction rub. No gallop.   Pulmonary:     Effort: Pulmonary effort is normal. No respiratory distress.     Breath sounds: Normal breath sounds.  Chest:     Chest wall: No tenderness.  Abdominal:     General: Bowel sounds are normal.     Palpations: Abdomen is soft.     Tenderness: There is no abdominal tenderness. There is no guarding or rebound. Negative signs include Murphy's sign and McBurney's sign.     Hernia: No hernia is present.  Musculoskeletal: Normal range of motion.  Skin:    General: Skin is warm and dry.     Findings: No rash.  Neurological:     Mental Status: She is alert and oriented to person, place, and time.     GCS: GCS eye subscore is 4. GCS verbal subscore is 5. GCS motor subscore is 6.     Cranial Nerves: No cranial nerve deficit.     Sensory: No sensory deficit.     Coordination: Coordination normal.     Comments: Patient agitated and restless, having difficulty sitting still in the bed.  She keeps trying to climb out of the bed, sticking her legs through the rails.  Although she is oriented, she is having difficulty comprehending answers to questions.  She keeps asking the same questions and forgetting that it was answered.  Psychiatric:        Mood and Affect: Mood is anxious.        Speech: Speech normal.        Behavior: Behavior normal.        Thought Content: Thought content normal.      ED Treatments / Results  Labs (all labs ordered are listed, but only abnormal results are displayed) Labs Reviewed  BASIC METABOLIC PANEL - Abnormal; Notable for the following components:      Result Value   CO2 18 (*)    Glucose, Bld 188 (*)    All other components within normal limits  CBC - Abnormal; Notable for the following components:   RBC 5.91  (*)    Hemoglobin 11.5 (*)    MCV 62.8 (*)    MCH 19.5 (*)    RDW 19.6 (*)    All other components within normal limits  TROPONIN I (HIGH SENSITIVITY) - Abnormal; Notable for the following components:   Troponin I (High Sensitivity) 959 (*)    All other components within normal limits  TROPONIN I (HIGH SENSITIVITY) - Abnormal; Notable for the following components:   Troponin I (High Sensitivity) 860 (*)    All other components within normal limits  SARS CORONAVIRUS 2 (HOSPITAL ORDER, [facility])  PROTIME-INR  APTT  HEPARIN LEVEL (UNFRACTIONATED)    EKG EKG Interpretation  Date/Time:  Tuesday October 21 2018 04:06:43 EDT Ventricular Rate:  149 PR Interval:    QRS Duration: 148 QT Interval:  308 QTC Calculation: 485 R Axis:   -60 Text  Interpretation:   Critical Test Result: Arrhythmia Atrial fibrillation with rapid ventricular response with premature ventricular or aberrantly conducted complexes Left axis deviation Left bundle branch block Abnormal ECG Confirmed by Orpah Greek [phone]) on 10/21/2018 5:23:52 AM   EKG Interpretation  Date/Time:  Tuesday October 21 2018 07:11:47 EDT Ventricular Rate:  99 PR Interval:    QRS Duration: 155 QT Interval:  412 QTC Calculation: 529 R Axis:   -56 Text Interpretation:  Sinus rhythm Left bundle branch block Confirmed by Orpah Greek [phone]) on 10/21/2018 7:25:37 AM       Radiology Ct Head Wo Contrast  Result Date: 10/21/2018 CLINICAL DATA:  Acute headache with normal neuro exam EXAM: CT HEAD WITHOUT CONTRAST TECHNIQUE: Contiguous axial images were obtained from the base of the skull through the vertex without intravenous contrast. COMPARISON:  10/04/2018 FINDINGS: Brain: Small bilateral cerebellar infarcts have become apparent since prior. On axial slices there is question of a right insular cortex infarct, but this could also be explained by prominent motion at this level. No hydrocephalus,  collection, or visible acute hemorrhage. Vascular: No hyperdense vessel. Skull: Negative Sinuses/Orbits: Clear sinuses IMPRESSION: 1. Small bilateral cerebellar infarcts have occurred since study earlier this month. 2. Equivocal for interval right insular infarct, certainty degraded by prominent motion artifact. Electronically Signed   By: Monte Fantasia M.D.   On: 10/21/2018 05:55   Dg Chest Portable 1 View  Result Date: 10/21/2018 CLINICAL DATA:  Shortness of breath EXAM: PORTABLE CHEST 1 VIEW COMPARISON:  10/03/2018 FINDINGS: Normal heart size. Transcatheter aortic valve replacement. Left PICC with tip at the upper cavoatrial junction. There is no edema, consolidation, effusion, or pneumothorax. Negative mediastinal contours. IMPRESSION: No evidence of acute disease. Electronically Signed   By: Monte Fantasia M.D.   On: 10/21/2018 04:25    Procedures Procedures (including critical care time)  Medications Ordered in ED Medications  sodium chloride flush (NS) 0.9 % injection 3 mL (3 mLs Intravenous Not Given 10/21/18 0505)  diltiazem (CARDIZEM) 1 mg/mL load via infusion 10 mg (10 mg Intravenous Bolus from Bag 10/21/18 0458)    And  diltiazem (CARDIZEM) 100 mg in dextrose 5% 148m (1 mg/mL) infusion (15 mg/hr Intravenous Rate/Dose Change 10/21/18 0634)  heparin ADULT infusion 100 units/mL (25000 units/2579msodium chloride 0.45%) (0 Units/hr Intravenous Paused 10/21/18 0618)  heparin bolus via infusion 3,000 Units (3,000 Units Intravenous Bolus from Bag 10/21/18 0454)  ALPRAZolam (XANAX) tablet 0.5 mg (0.5 mg Oral Given 10/21/18 0520)  LORazepam (ATIVAN) injection 1 mg (1 mg Intravenous Given 10/21/18 [id number]    Initial Impression / Assessment and Plan / ED Course  I have reviewed the triage vital signs and the nursing notes.  Pertinent labs & imaging results that were available during my care of the patient were reviewed by me and considered in my medical decision making (see chart for details).         Patient with a very complex recent medical history.  Patient underwent TAVR procedure in March.  She was then found to have bacteremia and was diagnosed with mitral valve endocarditis.  She is currently on outpatient IV Rocephin for treatment.  She presents today with shortness of breath and is found to be in atrial fibrillation with a rapid ventricular response.  She has not had any documented episodes of A. fib in the past.  Patient initiated on heparin and IV Cardizem with successful rate control.  Patient complaining of headache at arrival as well.  She  does not typically have headaches.  She underwent CT head.  CT head shows evidence of bilateral cerebellar infarcts.  This was compared to CT from 2 weeks ago and these areas are new.  This was briefly discussed with Dr. Rory Percy, specifically the heparinization that had been started.  It was recommended that the heparin be stopped and patient have CT angio of head and neck to rule out mycotic aneurysm.   Patient's high-sensitivity troponin is elevated.  She underwent heart catheterization in December 2019.  At that time she had stenting of her right coronary artery and all of her other arteries were widely patent.  She is not currently experiencing any chest pain, elevated troponin is likely leak from the rapid ventricular response to her atrial fibrillation.  EKG does not show any evidence of obvious ischemia or ST elevation.  Second troponin is actually down.  Patient has converted to sinus rhythm while on the Cardizem.  Will sign out to oncoming ER physician to follow-up CT angiography and MRI to help determine if patient needs any further interventions.  CRITICAL CARE Performed by: Orpah Greek   Total critical care time: 75 minutes  Critical care time was exclusive of separately billable procedures and treating other patients.  Critical care was necessary to treat or prevent imminent or life-threatening deterioration.   Critical care was time spent personally by me on the following activities: development of treatment plan with patient and/or surrogate as well as nursing, discussions with consultants, evaluation of patient's response to treatment, examination of patient, obtaining history from patient or surrogate, ordering and performing treatments and interventions, ordering and review of laboratory studies, ordering and review of radiographic studies, pulse oximetry and re-evaluation of patient's condition.   Final Clinical Impressions(s) / ED Diagnoses   Final diagnoses:  Atrial fibrillation with [facility])    ED Discharge Orders    None       Seyed Heffley, Gwenyth Allegra, MD 10/21/18 8719    Orpah Greek, MD 10/21/18 [phone]

## 2018-10-21 NOTE — ED Notes (Signed)
Patient a/o x4 but continues to ask repetitive questions

## 2018-10-21 NOTE — ED Notes (Signed)
Patient transported to CT 

## 2018-10-22 ENCOUNTER — Inpatient Hospital Stay (HOSPITAL_COMMUNITY): Payer: Medicare Other

## 2018-10-22 ENCOUNTER — Telehealth: Payer: Self-pay | Admitting: Adult Health

## 2018-10-22 ENCOUNTER — Other Ambulatory Visit: Payer: Self-pay

## 2018-10-22 ENCOUNTER — Inpatient Hospital Stay: Payer: Medicare Other | Admitting: Internal Medicine

## 2018-10-22 DIAGNOSIS — R110 Nausea: Secondary | ICD-10-CM

## 2018-10-22 DIAGNOSIS — I639 Cerebral infarction, unspecified: Secondary | ICD-10-CM

## 2018-10-22 DIAGNOSIS — I10 Essential (primary) hypertension: Secondary | ICD-10-CM

## 2018-10-22 DIAGNOSIS — R42 Dizziness and giddiness: Secondary | ICD-10-CM

## 2018-10-22 DIAGNOSIS — I63449 Cerebral infarction due to embolism of unspecified cerebellar artery: Secondary | ICD-10-CM

## 2018-10-22 LAB — GLUCOSE, CAPILLARY
Glucose-Capillary: 131 mg/dL — ABNORMAL HIGH (ref 70–99)
Glucose-Capillary: 131 mg/dL — ABNORMAL HIGH (ref 70–99)
Glucose-Capillary: 95 mg/dL (ref 70–99)
Glucose-Capillary: 178 mg/dL — ABNORMAL HIGH (ref 70–99)
Glucose-Capillary: 123 mg/dL — ABNORMAL HIGH (ref 70–99)

## 2018-10-22 LAB — BASIC METABOLIC PANEL
Anion gap: 13 (ref 5–15)
BUN: 13 mg/dL (ref 8–23)
CO2: 24 mmol/L (ref 22–32)
Calcium: 9.3 mg/dL (ref 8.9–10.3)
Chloride: 103 mmol/L (ref 98–111)
Creatinine, Ser: 0.82 mg/dL (ref 0.44–1.00)
GFR calc Af Amer: >60 mL/min (ref >60)
GFR calc non Af Amer: >60 mL/min (ref >60)
Glucose, Bld: 132 mg/dL — ABNORMAL HIGH (ref 70–99)
Potassium: 3.3 mmol/L — ABNORMAL LOW (ref 3.5–5.1)
Sodium: 140 mmol/L (ref 135–145)

## 2018-10-22 LAB — LIPID PANEL
Cholesterol: 128 mg/dL (ref 0–200)
HDL: 24 mg/dL — ABNORMAL LOW (ref >40)
LDL Cholesterol: 85 mg/dL (ref 0–99)
Total CHOL/HDL Ratio: 5.3 RATIO
Triglycerides: 95 mg/dL (ref <150)
VLDL: 19 mg/dL (ref 0–40)

## 2018-10-22 LAB — CBC
HCT: 30.5 % — ABNORMAL LOW (ref 36.0–46.0)
Hemoglobin: 9.2 g/dL — ABNORMAL LOW (ref 12.0–15.0)
MCH: 19.3 pg — ABNORMAL LOW (ref 26.0–34.0)
MCHC: 30.2 g/dL (ref 30.0–36.0)
MCV: 64.1 fL — ABNORMAL LOW (ref 80.0–100.0)
Platelets: 182 10*3/uL (ref 150–400)
RBC: 4.76 MIL/uL (ref 3.87–5.11)
RDW: 18.7 % — ABNORMAL HIGH (ref 11.5–15.5)
WBC: 6.4 10*3/uL (ref 4.0–10.5)
nRBC: 0 % (ref 0.0–0.2)

## 2018-10-22 LAB — HEMOGLOBIN A1C
Hgb A1c MFr Bld: 7.5 % — ABNORMAL HIGH (ref 4.8–5.6)
Mean Plasma Glucose: 168.55 mg/dL

## 2018-10-22 LAB — HEPARIN LEVEL (UNFRACTIONATED)
Heparin Unfractionated: 0.21 IU/mL — ABNORMAL LOW (ref 0.30–0.70)
Heparin Unfractionated: 0.23 IU/mL — ABNORMAL LOW (ref 0.30–0.70)

## 2018-10-22 MED ORDER — DILTIAZEM LOAD VIA INFUSION
10.0000 mg | Freq: Once | INTRAVENOUS | Status: AC
  Administered 2018-10-22: 10 mg via INTRAVENOUS

## 2018-10-22 MED ORDER — SODIUM CHLORIDE 0.9 % IV SOLN
2.0000 g | Freq: Two times a day (BID) | INTRAVENOUS | Status: DC
  Administered 2018-10-22 – 2018-10-23 (×3): 2 g via INTRAVENOUS
  Filled 2018-10-22: qty 20
  Filled 2018-10-22: qty 2
  Filled 2018-10-22: qty 20
  Filled 2018-10-22: qty 2

## 2018-10-22 MED ORDER — DILTIAZEM HCL-DEXTROSE 100-5 MG/100ML-% IV SOLN (PREMIX)
5.0000 mg/h | INTRAVENOUS | Status: DC
  Administered 2018-10-22: 10 mg/h via INTRAVENOUS
  Administered 2018-10-22: 5 mg/h via INTRAVENOUS
  Administered 2018-10-22: 15 mg/h via INTRAVENOUS
  Administered 2018-10-22: 12.5 mg/h via INTRAVENOUS
  Filled 2018-10-22 (×2): qty 100

## 2018-10-22 MED ORDER — LIP MEDEX EX OINT
TOPICAL_OINTMENT | CUTANEOUS | Status: DC | PRN
  Filled 2018-10-22: qty 7

## 2018-10-22 MED ORDER — POTASSIUM CHLORIDE CRYS ER 20 MEQ PO TBCR
40.0000 meq | EXTENDED_RELEASE_TABLET | Freq: Once | ORAL | Status: DC
  Filled 2018-10-22: qty 2

## 2018-10-22 MED ORDER — ADULT MULTIVITAMIN W/MINERALS CH
1.0000 | ORAL_TABLET | Freq: Every day | ORAL | Status: DC
  Administered 2018-10-23: 1 via ORAL
  Filled 2018-10-22: qty 1

## 2018-10-22 MED ORDER — ZOLPIDEM TARTRATE 5 MG PO TABS
5.0000 mg | ORAL_TABLET | Freq: Every evening | ORAL | Status: DC | PRN
  Administered 2018-10-22: 5 mg via ORAL
  Filled 2018-10-22: qty 1

## 2018-10-22 MED ORDER — ENSURE ENLIVE PO LIQD
237.0000 mL | Freq: Two times a day (BID) | ORAL | Status: DC
  Administered 2018-10-23: 237 mL via ORAL

## 2018-10-22 MED ORDER — LORAZEPAM 2 MG/ML IJ SOLN
1.0000 mg | Freq: Once | INTRAMUSCULAR | Status: AC
  Administered 2018-10-22: 1 mg via INTRAVENOUS
  Filled 2018-10-22: qty 1

## 2018-10-22 NOTE — Progress Notes (Signed)
[place] for heparin Indication: atrial fibrillation, stroke  Allergies  Allergen Reactions  . Statins Other (See Comments)    Muscle aches    Patient Measurements: Height: 5' 5"  (165.1 cm) Weight: 191 lb 2.2 oz (86.7 kg) IBW/kg (Calculated) : 57 Heparin Dosing Weight: 76.8 kg  Vital Signs: Temp: 98 F (36.7 C) (07/01 0811) Temp Source: Oral (07/01 0811) BP: 126/59 (07/01 1100) Pulse Rate: 87 (07/01 1100)  Estimated Creatinine Clearance: 65.5 mL/min (by C-G formula based on SCr of 0.82 mg/dL).   Medical History: Past Medical History:  Diagnosis Date  . Arthritis    "back" (04/22/2018)  . CAD (coronary artery disease)    a. 03/2018 s/p PCI/DES to the RCA (3.0x15 Onyx DES).  . Carotid artery stenosis    Mild  . Chronic lower back pain   . Colon polyps   . Diverticulitis   . Diverticulosis   . Esophageal thickening    seen on pre TAVR CT scan, also questionable cirrhosis. MRI recommended. Will refer to GI  . GERD (gastroesophageal reflux disease)   . Grave's disease   . History of colonic polyps 05/22/2017  . History of hiatal hernia   . Hypertension   . Hypothyroidism   . IBS (irritable bowel syndrome)   . Osteopenia   . Pulmonary nodules    seen on pre TAVR CT. likley benign. no follow up recommended if pt low risk.  . S/P TAVR (transcatheter aortic valve replacement)   . Severe aortic stenosis   . Thalassemia minor   . Type II diabetes mellitus (H[facility])     Assessment: 73yo female c/o SOB associated w/ nausea, found to be in Afib and started on diltiazem. Pharmacy is now consulted to dose/monitor heparin per the stroke protocol (per Neurology, pt likely has new stroke secondary to a fib vs. endocarditis).  Heparin infusion is therapeutic at 0.21, on 1100 units/hr. Hgb 9.2, plt 182. No s/sx of bleeding. No infusion issues.   Goal of Therapy:  Heparin level 0.3-0.5 units/ml Monitor platelets by anticoagulation protocol:  Yes   Plan:  Increase heparin infusion to 1250 units/hr Check heparin level in 8 hrs  Monitor daily CBC and signs/symptoms of bleeding.  Antonietta Jewel, PharmD, [place] Clinical Pharmacist  Pager: [phone] Phone: [phone] 10/22/2018,1:46 PM

## 2018-10-22 NOTE — Progress Notes (Signed)
Patient ID: Breanna Webster, female   DOB: 09-Apr-1946, 73 y.o.   MRN: [id number]         [facility]  Date of Admission:  10/21/2018           Day 15 ceftriaxone ASSESSMENT: She was admitted recently with strep sella various bacteremia.  She has a history of TAVR.  TEE at that time revealed mitral valve endocarditis but no infection on her prosthetic aortic valve.  She was discharged on IV ceftriaxone and gentamicin.  She completed 2 weeks of gentamicin yesterday.  She was readmitted with an apparent embolic cerebellar stroke.  TTE was repeated yesterday but was of limited quality.  No comment was made about vegetations or new valvular dysfunction.  Brain MRI is pending.  Repeat blood cultures are negative so far.  PLAN: 1. Continue ceftriaxone for 4 more weeks  Principal Problem:   Cerebellar stroke, acute (HCC) Active Problems:   Bacteremia due to Streptococcus Salivarius   Atrial fibrillation with RVR (HCC)   Streptococcal endocarditis   Hypothyroidism   Dyslipidemia   Diabetes mellitus due to underlying condition, uncontrolled (HCC)   Essential hypertension   S/P TAVR (transcatheter aortic valve replacement)   Scheduled Meds: . aspirin EC  81 mg Oral Daily  . clopidogrel  75 mg Oral Q breakfast  . escitalopram  20 mg Oral Daily  . insulin aspart  0-15 Units Subcutaneous TID WC  . insulin aspart  0-5 Units Subcutaneous QHS  . insulin NPH Human  20 Units Subcutaneous QAC breakfast  . levothyroxine  175 mcg Oral QAC breakfast  . nortriptyline  25 mg Oral QHS  . pantoprazole  40 mg Oral BID  . potassium chloride  40 mEq Oral Once   Continuous Infusions: . sodium chloride 50 mL/hr at 10/21/18 1600  . cefTRIAXone (ROCEPHIN)  IV 2 g (10/22/18 0929)  . diltiazem (CARDIZEM) infusion 15 mg/hr (10/21/18 0634)  . heparin 1,100 Units/hr (10/22/18 0619)   PRN Meds:.acetaminophen **OR** acetaminophen (TYLENOL) oral liquid 160 mg/5 mL **OR** acetaminophen,  senna-docusate   SUBJECTIVE: She is complaining of dizziness and nausea.  Review of Systems: Review of Systems  Unable to perform ROS: Mental acuity    Allergies  Allergen Reactions  . Statins Other (See Comments)    Muscle aches    OBJECTIVE: Vitals:   10/21/18 1717 10/22/18 0012 10/22/18 0314 10/22/18 0811  BP: (!) 116/92 121/85 133/60 120/64  Pulse: (!) 56 62 85   Resp: 19 20 19 17   Temp:  98.2 F (36.8 C) 98 F (36.7 C) 98 F (36.7 C)  TempSrc:  Oral Oral Oral  SpO2: 98% 98% 96%   Weight:   86.7 kg   Height:       Body mass index is 31.81 kg/m.  Physical Exam Constitutional:      Comments: She is resting quietly in bed with her eyes closed.  She is very sleepy and lethargic.  She answers questions very slowly with short answers.  Cardiovascular:     Rate and Rhythm: Normal rate and regular rhythm.     Pulses: Normal pulses.     Heart sounds: Normal heart sounds. No murmur.     Comments: Very distant heart sounds.    Lab Results Lab Results  Component Value Date   WBC 6.4 10/22/2018   HGB 9.2 (L) 10/22/2018   HCT 30.5 (L) 10/22/2018   MCV 64.1 (L) 10/22/2018   PLT 182 10/22/2018  Lab Results  Component Value Date   CREATININE 0.82 10/22/2018   BUN 13 10/22/2018   NA 140 10/22/2018   K 3.3 (L) 10/22/2018   CL 103 10/22/2018   CO2 24 10/22/2018    Lab Results  Component Value Date   ALT 26 10/05/2018   AST 34 10/05/2018   ALKPHOS 66 10/05/2018   BILITOT 0.9 10/05/2018     Microbiology: Recent Results (from the past 240 hour(s))  SARS Coronavirus 2 (CEPHEID - Performed in [place] hospital lab), Hosp Order     Status: None   Collection Time: 10/21/18  6:04 AM   Specimen: Nasopharyngeal Swab  Result Value Ref Range Status   SARS Coronavirus 2 NEGATIVE NEGATIVE Final    Comment: (NOTE) If result is NEGATIVE SARS-CoV-2 target nucleic acids are NOT DETECTED. The SARS-CoV-2 RNA is generally detectable in upper and lower  respiratory  specimens during the acute phase of infection. The lowest  concentration of SARS-CoV-2 viral copies this assay can detect is 250  copies / mL. A negative result does not preclude SARS-CoV-2 infection  and should not be used as the sole basis for treatment or other  patient management decisions.  A negative result may occur with  improper specimen collection / handling, submission of specimen other  than nasopharyngeal swab, presence of viral mutation(s) within the  areas targeted by this assay, and inadequate number of viral copies  (<250 copies / mL). A negative result must be combined with clinical  observations, patient history, and epidemiological information. If result is POSITIVE SARS-CoV-2 target nucleic acids are DETECTED. The SARS-CoV-2 RNA is generally detectable in upper and lower  respiratory specimens dur ing the acute phase of infection.  Positive  results are indicative of active infection with SARS-CoV-2.  Clinical  correlation with patient history and other diagnostic information is  necessary to determine patient infection status.  Positive results do  not rule out bacterial infection or co-infection with other viruses. If result is PRESUMPTIVE POSTIVE SARS-CoV-2 nucleic acids MAY BE PRESENT.   A presumptive positive result was obtained on the submitted specimen  and confirmed on repeat testing.  While 2019 novel coronavirus  (SARS-CoV-2) nucleic acids may be present in the submitted sample  additional confirmatory testing may be necessary for epidemiological  and / or clinical management purposes  to differentiate between  SARS-CoV-2 and other Sarbecovirus currently known to infect humans.  If clinically indicated additional testing with an alternate test  methodology [phone]) is advised. The SARS-CoV-2 RNA is generally  detectable in upper and lower respiratory sp ecimens during the acute  phase of infection. The expected result is Negative. Fact Sheet for  Patients:  StrictlyIdeas.no Fact Sheet for Healthcare Providers: BankingDealers.co.za This test is not yet approved or cleared by the [place] FDA and has been authorized for detection and/or diagnosis of SARS-CoV-2 by FDA under an Emergency Use Authorization (EUA).  This EUA will remain in effect (meaning this test can be used) for the duration of the COVID-19 declaration under Section 564(b)(1) of the Act, 21 U.S.C. section 360bbb-3(b)(1), unless the authorization is terminated or revoked sooner. Performed at [facility], [place] [address] [place], [place] [address]   Culture, blood (routine x 2)     Status: None (Preliminary result)   Collection Time: 10/21/18  4:50 PM   Specimen: BLOOD  Result Value Ref Range Status   Specimen Description BLOOD RIGHT ANTECUBITAL  Final   Special Requests AEROBIC BOTTLE ONLY Blood  Culture adequate volume  Final   Culture   Final    NO GROWTH < 24 HOURS Performed at [facility], [place] [address] [place], [place] [address]    Report Status PENDING  Incomplete  Culture, blood (routine x 2)     Status: None (Preliminary result)   Collection Time: 10/21/18  4:55 PM   Specimen: BLOOD LEFT HAND  Result Value Ref Range Status   Specimen Description BLOOD LEFT HAND  Final   Special Requests   Final    AEROBIC BOTTLE ONLY Blood Culture results may not be optimal due to an inadequate volume of blood received in culture bottles   Culture   Final    NO GROWTH < 24 HOURS Performed at [facility], [place] [address] [place], [place] [address]    Report Status PENDING  Incomplete    Michel Bickers, MD [facility] [phone] pager   [phone] cell 10/22/2018, 11:54 AM

## 2018-10-22 NOTE — Progress Notes (Addendum)
Pt had approx. 5.4 sec pause followed by 2 sec pause on cardizem gtt. Drip was turned off, Schorr NP notified and will leave off. To re-page and reorder if rate becomes uncontrolled. HR currently NSR in 60s. Will continue to monitor.

## 2018-10-22 NOTE — Evaluation (Signed)
Physical Therapy Evaluation Patient Details Name: Breanna Webster MRN: [id number] DOB: [birth year] Today's Date: 10/22/2018   History of Present Illness  Patient is a 38 female with history of diabetes mellitus, aortic stenosis status post TAVR in 3/20, hypothyroidism, hypertension, pulmonary hypertension, coronary disease status post stent in 12/19 who presented with shortness of breath.  She was found to be in A. fib with RVR on presentation.  She was delirious and CT showed acute cerebral infarcts.  Clinical Impression  Pt admitted with above diagnosis. Pt currently with functional limitations due to the deficits listed below (see PT Problem List). Pt very lethargic therefore eval limited and only able to sit EOB and stand for a minute.  Pt had meds therefore anticipate better progress when pt isnt so sleepy.   Pt will benefit from skilled PT to increase their independence and safety with mobility to allow discharge to the venue listed below.      Follow Up Recommendations Home health PT;Supervision/Assistance - 24 hour    Equipment Recommendations  None recommended by PT    Recommendations for Other Services       Precautions / Restrictions Precautions Precautions: Fall Restrictions Weight Bearing Restrictions: No      Mobility  Bed Mobility Overal bed mobility: Needs Assistance Bed Mobility: Supine to Sit     Supine to sit: Min assist Sit to supine: Min guard   General bed mobility comments: Pt reached for PT and pulled up on PT hand to come to sitting.   Transfers Overall transfer level: Needs assistance Equipment used: 2 person hand held assist Transfers: Sit to/from Stand Sit to Stand: Min assist         General transfer comment: Pt needed min assist to stand and steady as she was unsteady. Took steps to [facility] and then laid back down as she is sleepy per pt.   Ambulation/Gait                Stairs            Wheelchair Mobility    Modified  Rankin (Stroke Patients Only)       Balance Overall balance assessment: Needs assistance Sitting-balance support: No upper extremity supported;Feet supported Sitting balance-Leahy Scale: Good     Standing balance support: Bilateral upper extremity supported;During functional activity Standing balance-Leahy Scale: Poor Standing balance comment: relied on UE support today                             Pertinent Vitals/Pain Pain Assessment: No/denies pain    Home Living Family/patient expects to be discharged to:: Private residence Living Arrangements: Spouse/significant other Available Help at Discharge: Family;Available 24 hours/day Type of Home: House Home Access: Stairs to enter Entrance Stairs-Rails: Psychiatric nurse of Steps: 4 Home Layout: One level Home Equipment: Walker - 2 wheels;Bedside commode Additional Comments: info from previous stay. Pt giving conflicting information today.  Unsure if pt just lethargic vs. confused.     Prior Function Level of Independence: Independent         Comments: husband has had to walk with pt last  2 weeks and she has sponge bathed per pt     Hand Dominance   Dominant Hand: Right    Extremity/Trunk Assessment   Upper Extremity Assessment Upper Extremity Assessment: Defer to OT evaluation    Lower Extremity Assessment Lower Extremity Assessment: Generalized weakness    Cervical / Trunk Assessment Cervical /  Trunk Assessment: Normal  Communication   Communication: No difficulties  Cognition Arousal/Alertness: Lethargic Behavior During Therapy: Flat affect Overall Cognitive Status: Impaired/Different from baseline Area of Impairment: Memory                 Orientation Level: Situation;Time   Memory: Decreased short-term memory   Safety/Judgement: Decreased awareness of safety   Problem Solving: Slow processing        General Comments General comments (skin integrity, edema,  etc.): VSS    Exercises     Assessment/Plan    PT Assessment Patient needs continued PT services  PT Problem List Decreased strength;Decreased mobility;Decreased safety awareness;Decreased activity tolerance;Decreased cognition;Decreased balance;Decreased knowledge of use of DME       PT Treatment Interventions Gait training;Therapeutic exercise;Patient/family education;Stair training;Balance training;Functional mobility training;DME instruction;Therapeutic activities;Cognitive remediation    PT Goals (Current goals can be found in the Care Plan section)  Acute Rehab PT Goals Patient Stated Goal: get home with her husband PT Goal Formulation: With patient Time For Goal Achievement: 11/05/18 Potential to Achieve Goals: Good    Frequency Min 3X/week   Barriers to discharge        Co-evaluation               AM-PAC PT "6 Clicks" Mobility  Outcome Measure Help needed turning from your back to your side while in a flat bed without using bedrails?: A Little Help needed moving from lying on your back to sitting on the side of a flat bed without using bedrails?: A Little Help needed moving to and from a bed to a chair (including a wheelchair)?: A Little Help needed standing up from a chair using your arms (e.g., wheelchair or bedside chair)?: A Lot Help needed to walk in hospital room?: A Lot Help needed climbing 3-5 steps with a railing? : A Lot 6 Click Score: 15    End of Session Equipment Utilized During Treatment: Gait belt Activity Tolerance: Patient limited by fatigue Patient left: in bed;with call bell/phone within reach;with bed alarm set Nurse Communication: Mobility status PT Visit Diagnosis: Other abnormalities of gait and mobility (R26.89);Muscle weakness (generalized) (M62.81)    Time: 1207-1219 PT Time Calculation (min) (ACUTE ONLY): 12 min   Charges:   PT Evaluation $PT Eval Moderate Complexity: [place] Pager:  [phone]  Office:  [phone]    Denice Paradise 10/22/2018, 1:42 PM

## 2018-10-22 NOTE — Progress Notes (Addendum)
Patient c/o SOB. HR in the 150's-160's. EKG done. A Fib RVR. BP 172/74. O2 100% 2 liters [place]. No c/o chest pain. Patient restless in bed.   Reino Bellis, NP at bedside. New orders received.

## 2018-10-22 NOTE — Clinical Social Work Note (Signed)
CSW acknowledges SNF consult. PT and OT are recommending home health.  Dayton Scrape, [place]

## 2018-10-22 NOTE — Plan of Care (Signed)
Continue to monitor

## 2018-10-22 NOTE — Telephone Encounter (Signed)
Pt spouse wanted me to notify [name] about patients recent admission to [facility] for mild stroke.

## 2018-10-22 NOTE — Consult Note (Addendum)
Cardiology Consultation:   Patient ID: Breanna Webster MRN: [id number]; DOB: [birth year]  Admit date: 10/21/2018 Date of Consult: 10/22/2018  Primary Care Provider: [name], NP Primary Cardiologist: Sinclair Grooms, MD  Primary Electrophysiologist:  None    Patient Profile:   Breanna Webster is a 73 y.o. female with a hx of CAD s/p mRCA (12/19), LBBB, GERD, Grave's disease, HTN, Hypothyroidism, IBS, DM AS s/p TAVR who is being seen today for the evaluation of Afib RVR at the request of Dr. Tawanna Solo.  History of Present Illness:   Breanna Webster is a 73 yo female with PMH noted above. She is followed by Dr. Tamala Julian as an outpatient.  She complained of progressive exertional fatigue and dyspnea over several years.  Had an echocardiogram in August 2019 which showed trileaflet aortic valve with moderate thickening and calcification with some restriction of mobility. Underwent cardiac catheterization with Dr. Tamala Julian on 02/20/2018 which showed a 52 to 80% mid RCA stenosis, LAD left main and circumflex were all widely patent.  Peak to peak gradient across the aortic valve was 21 mmHg with a mean gradient of 18 mmHg.  She underwent PCI of the RCA on 04/22/2018 but did not have significant improvement in her symptoms afterwards.  Underwent a dobutamine stress echo on 05/14/2018 which showed a mean aortic gradient of 17 mmHg at baseline which increased to 40 mmHg at peak stress.  She was evaluated by the TAVR team and based on symptoms was felt to have severe AS.  Underwent successful TAVR on 07/08/2018.  Relatively uncomplicated postoperative course.  She still complained of continued shortness of breath with exertion and chronic fatigue.  Was planned to undergo a sleep study as an outpatient.  She was recently hospitalized 10/04/2018-10/09/2018 with sepsis/pneumonia and elevated troponin.  She was found to have acute encephalopathy and bacteremia.  Underwent TEE with documented infective endocarditis,  along with vegetation on the anterior mitral leaflet.  She was seen by infectious disease and placed on IV antibiotics which were planned to be continued through 7/28 via PICC line.  Then repeat echocardiogram after completion of antibiotics.  She presented to the ED on 10/21/2018 for ongoing shortness of breath and headache.  She was found to have new onset atrial fibrillation with RVR with rates in the 180s. Denies any palpitations prior to admission. She was started on a Dilt drip with a 10 mg bolus and converted to sinus rhythm.  CT head showed small bilateral cerebellar infarcts which were new from recent CT during admission 3 weeks prior.  Neurology was consulted and recommended MRI.  IV heparin was initiated as felt necessary from an A fib perspective.  She was seen by infectious disease as well who recommended continuation of antibiotic therapy.  Has completed 2 of 6-week course. Did have a repeat limited echo with normal EF and grade 1DD.   Heart Pathway Score:     Past Medical History:  Diagnosis Date   Arthritis    "back" (04/22/2018)   CAD (coronary artery disease)    a. 03/2018 s/p PCI/DES to the RCA (3.0x15 Onyx DES).   Carotid artery stenosis    Mild   Chronic lower back pain    Colon polyps    Diverticulitis    Diverticulosis    Esophageal thickening    seen on pre TAVR CT scan, also questionable cirrhosis. MRI recommended. Will refer to GI   GERD (gastroesophageal reflux disease)    Grave's disease  History of colonic polyps 05/22/2017   History of hiatal hernia    Hypertension    Hypothyroidism    IBS (irritable bowel syndrome)    Osteopenia    Pulmonary nodules    seen on pre TAVR CT. likley benign. no follow up recommended if pt low risk.   S/P TAVR (transcatheter aortic valve replacement)    Severe aortic stenosis    Thalassemia minor    Type II diabetes mellitus ([place])     Past Surgical History:  Procedure Laterality Date   43 HOUR [place]  STUDY N/A 03/03/2018   Procedure: 24 HOUR PH STUDY;  Surgeon: Mauri Pole, MD;  Location: [facility];  Service: Endoscopy;  Laterality: N/A;   COLONOSCOPY W/ BIOPSIES AND POLYPECTOMY     CORONARY ANGIOGRAPHY Right 04/21/2018   Procedure: CORONARY ANGIOGRAPHY (CATH LAB);  Surgeon: [name], MD;  Location: [place] [facility];  Service: Cardiovascular;  Laterality: Right;   CORONARY STENT INTERVENTION N/A 04/22/2018   Procedure: CORONARY STENT INTERVENTION;  Surgeon: [name], MD;  Location: [place] CV LAB;  Service: Cardiovascular;  Laterality: N/A;   DILATION AND CURETTAGE OF UTERUS     ESOPHAGEAL MANOMETRY N/A 03/03/2018   Procedure: ESOPHAGEAL MANOMETRY (EM);  Surgeon: Mauri Pole, MD;  Location: [facility];  Service: Endoscopy;  Laterality: N/A;   HERNIA REPAIR     HYSTEROSCOPY     fibroids   LAPAROSCOPIC CHOLECYSTECTOMY     LAPAROSCOPY     fibroids   NISSEN FUNDOPLICATION  2458K   RIGHT/LEFT HEART CATH AND CORONARY ANGIOGRAPHY N/A 02/20/2018   Procedure: RIGHT/LEFT HEART CATH AND CORONARY ANGIOGRAPHY;  Surgeon: [name], MD;  Location: [place] [facility];  Service: Cardiovascular;  Laterality: N/A;   TEE WITHOUT CARDIOVERSION N/A 07/08/2018   Procedure: TRANSESOPHAGEAL ECHOCARDIOGRAM (TEE);  Surgeon: Burnell Blanks, MD;  Location: [place];  Service: Open Heart Surgery;  Laterality: N/A;   TEE WITHOUT CARDIOVERSION  10/07/2018   TEE WITHOUT CARDIOVERSION N/A 10/07/2018   Procedure: TRANSESOPHAGEAL ECHOCARDIOGRAM (TEE);  Surgeon: Jerline Pain, MD;  Location: [facility] ENDOSCOPY;  Service: Cardiovascular;  Laterality: N/A;   TONSILLECTOMY     TRANSCATHETER AORTIC VALVE REPLACEMENT, TRANSFEMORAL N/A 07/08/2018   Procedure: TRANSCATHETER AORTIC VALVE REPLACEMENT, TRANSFEMORAL;  Surgeon: Burnell Blanks, MD;  Location: [place];  Service: Open Heart Surgery;  Laterality: N/A;     Home Medications:  Prior to Admission medications    Medication Sig Start Date End Date Taking? Authorizing Provider  acetaminophen (TYLENOL) 500 MG tablet Take 1,000 mg by mouth every 6 (six) hours as needed (for pain.).   Yes [provider]  aspirin 81 MG tablet Take 81 mg by mouth daily.     Yes [provider]  cefTRIAXone (ROCEPHIN) IVPB Inject 2 g into the vein daily. Indication:  Strep bacteremia with endocarditis Last Day of Therapy:  11/18/18 Labs - Once weekly:  CBC/D and BMP, Labs - Every other week:  ESR and CRP 10/09/18 11/18/18 Yes Hall, Carole N, DO  chlorpheniramine (CHLOR-TRIMETON) 4 MG tablet Take 4 mg by mouth at bedtime.    Yes [provider]  cholecalciferol (VITAMIN D) 1000 units tablet Take 1,000 Units by mouth daily.   Yes [provider]  clopidogrel (PLAVIX) 75 MG tablet Take 1 tablet (75 mg total) by mouth daily with breakfast. 04/24/18  Yes Theora Gianotti, NP  ferrous sulfate 325 (65 FE) MG tablet Take 1 tablet (325 mg total)  by mouth 2 (two) times daily with a meal. 10/09/18  Yes Hall, Carole N, DO  insulin NPH Human (NOVOLIN N RELION) 100 UNIT/ML injection Inject 0.4 mLs (40 Units total) into the skin every morning. And syringes 1/day 10/09/18  Yes Hall, Carole N, DO  losartan (COZAAR) 50 MG tablet TAKE 1 TABLET(50 MG) BY MOUTH DAILY Patient taking differently: Take 50 mg by mouth daily.  10/01/18  Yes Nafziger, Tommi Rumps, NP  Multiple Vitamin (MULITIVITAMIN WITH MINERALS) TABS Take 1 tablet by mouth daily.   Yes [provider]  nitroGLYCERIN (NITROSTAT) 0.4 MG SL tablet Place 1 tablet (0.4 mg total) under the tongue every 5 (five) minutes as needed for chest pain. 04/23/18  Yes Theora Gianotti, NP  nortriptyline (PAMELOR) 25 MG capsule TAKE 1 CAPSULE(25 MG) BY MOUTH AT BEDTIME Patient taking differently: Take 25 mg by mouth at bedtime.  10/01/18  Yes Nafziger, Tommi Rumps, NP  pantoprazole (PROTONIX) 40 MG tablet Take 1 tablet (40 mg total) by mouth 2 (two) times daily.  10/01/18  Yes [name], [name], MD  SYNTHROID 175 MCG tablet TAKE 1 TABLET BY MOUTH EVERY DAY Patient taking differently: Take 175 mcg by mouth daily before breakfast.  10/02/18  Yes Renato Shin, MD  bisoprolol (ZEBETA) 5 MG tablet Take 1 tablet (5 mg total) by mouth daily. 10/21/18   Nafziger, Tommi Rumps, NP  escitalopram (LEXAPRO) 20 MG tablet Take 1 tablet (20 mg total) by mouth daily. 10/21/18   Nafziger, Tommi Rumps, NP  hydrochlorothiazide (HYDRODIURIL) 12.5 MG tablet Take 1 tablet (12.5 mg total) by mouth daily. 10/21/18   Nafziger, Tommi Rumps, NP  Insulin Syringe-Needle U-100 (BD INSULIN SYRINGE U/F) 31G X 5/16" 1 ML MISC USE TO INJECT INSULIN ONCE DAILY 03/27/18   Renato Shin, MD  [place] LANCETS 41P MISC USE TO CHECK BLOOD SUGAR TWICE DAILY Patient taking differently: 2 (two) times a day.  02/26/18   Renato Shin, MD  [facility] VERIO test strip USE TO CHECK BLOOD SUGAR TWICE DAILY Patient taking differently: 1 each by Other route 2 (two) times a day.  08/23/17   Renato Shin, MD    Inpatient Medications: Scheduled Meds:  aspirin EC  81 mg Oral Daily   clopidogrel  75 mg Oral Q breakfast   escitalopram  20 mg Oral Daily   feeding supplement (ENSURE ENLIVE)  237 mL Oral BID BM   insulin aspart  0-15 Units Subcutaneous TID WC   insulin aspart  0-5 Units Subcutaneous QHS   insulin NPH Human  20 Units Subcutaneous QAC breakfast   multivitamin with minerals  1 tablet Oral Daily   nortriptyline  25 mg Oral QHS   pantoprazole  40 mg Oral BID   potassium chloride  40 mEq Oral Once   Continuous Infusions:  sodium chloride 50 mL/hr at 10/22/18 1328   cefTRIAXone (ROCEPHIN)  IV 2 g (10/22/18 0929)   diltiazem (CARDIZEM) infusion 15 mg/hr (10/21/18 0634)   heparin 1,250 Units/hr (10/22/18 1357)   PRN Meds: acetaminophen **OR** acetaminophen (TYLENOL) oral liquid 160 mg/5 mL **OR** acetaminophen, lip balm, senna-docusate  Allergies:    Allergies  Allergen Reactions   Statins  Other (See Comments)    Muscle aches    Social History:   Social History   Socioeconomic History   Marital status: Married    Spouse name: Not on file   Number of children: 2   Years of education: Not on file   Highest education level: Not on file  Occupational History  Occupation: Tree surgeon of the [employer] strain: Not on file   Food insecurity    Worry: Not on file    Inability: Not on file   Transportation needs    Medical: Not on file    Non-medical: Not on file  Tobacco Use   Smoking status: Never Smoker   Smokeless tobacco: Never Used  Substance and Sexual Activity   Alcohol use: No    Alcohol/week: 0.0 standard drinks   Drug use: Never   Sexual activity: Not Currently  Lifestyle   Physical activity    Days per week: Not on file    Minutes per session: Not on file   Stress: Not on file  Relationships   Social connections    Talks on phone: Not on file    Gets together: Not on file    Attends religious service: Not on file    Active member of club or organization: Not on file    Attends meetings of clubs or organizations: Not on file    Relationship status: Not on file   Intimate partner violence    Fear of current or ex partner: Not on file    Emotionally abused: Not on file    Physically abused: Not on file    Forced sexual activity: Not on file  Other Topics Concern   Not on file  Social History Narrative   Lives with husband in a one story home.     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   Education: college.    Family History:    Family History  Adopted: Yes  Problem Relation Age of Onset   Healthy Son        x 2   Headache Other        Cluster headaches   Heart failure Mother    Colon cancer Neg Hx    Pancreatic cancer Neg Hx    Rectal cancer Neg Hx    Stomach cancer Neg Hx      ROS:    Please see the history of present illness.   All other ROS reviewed and negative.     Physical Exam/Data:   Vitals:   10/22/18 0012 10/22/18 0314 10/22/18 0811 10/22/18 1100  BP: 121/85 133/60 120/64 (!) 126/59  Pulse: 62 85  87  Resp: 20 19 17  (!) 31  Temp: 98.2 F (36.8 C) 98 F (36.7 C) 98 F (36.7 C)   TempSrc: Oral Oral Oral   SpO2: 98% 96%  99%  Weight:  86.7 kg    Height:        Intake/Output Summary (Last 24 hours) at 10/22/2018 1424 Last data filed at 10/21/2018 1600 Gross per 24 hour  Intake 115.38 ml  Output --  Net 115.38 ml   Last 3 Weights 10/22/2018 10/21/2018 10/17/2018  Weight (lbs) 191 lb 2.2 oz 197 lb 15.6 oz 198 lb  Weight (kg) 86.7 kg 89.8 kg 89.812 kg     Body mass index is 31.81 kg/m.  General:  Pleasant older AAF, no distress HEENT: normal Neck: no JVD Endocrine:  No thryomegaly Vascular: No carotid bruits Cardiac:  normal S1, S2; RRR; + systolic murmur  Lungs:  clear to auscultation bilaterally, no wheezing, rhonchi or rales  Abd: soft, nontender, no hepatomegaly  Ext: no edema Musculoskeletal:  No deformities, BUE and BLE strength normal and equal Skin: warm  and dry  Neuro:  CNs 2-12 intact, no focal abnormalities noted Psych:  Normal affect   EKG:  The EKG was personally reviewed and demonstrates: 10/21/18 Afib RVR with known LBBB--> SR with PVCs LBBB Telemetry:  Telemetry was personally reviewed and demonstrates:  SR, freq PACs  Relevant CV Studies:  TEE: 10/07/18  IMPRESSIONS    1. The right ventricle has normal systolc function. There is no increase in right ventricular wall thickness.  2. Left atrial size was mildly dilated.  3. No evidence of a thrombus present in the left atrial appendage.  4. Mildly thickened tricuspid valve leaflets.  5. The mitral valve is degenerative. Mitral valve regurgitation is mild to moderate by color flow Doppler. A moderate pedunculated and mobile vegetation is seen on the anterior mitral leaflet.  The vegetation measures 11 mm by 18 mm.  6. 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 Tricuspid valve regurgitation is mild-moderate.  7. A 65m Edwards Sapien bioprosthetic aortic valve (TAVR) valve is present in the aortic position. Procedure Date: 07/08/2018 Normal aortic valve prosthesis.  8. The left ventricle has normal systolic function, with an ejection fraction of 60-65%. No evidence of left ventricular regional wall motion abnormalities.  TTE: 10/21/18  IMPRESSIONS    1. The left ventricle has normal systolic function, with an ejection fraction of 60-65%. The cavity size was normal. There is mildly increased left ventricular wall thickness. Left ventricular diastolic Doppler parameters are consistent with impaired  relaxation. Indeterminate filling pressures.  2. There is moderate mitral annular calcification present. Mild mitral valve stenosis.  Laboratory Data:  High Sensitivity Troponin:   Recent Labs  Lab 10/21/18 0409 10/21/18 0532  TROPONINIHS 959* 860*     Cardiac EnzymesNo results for input(s): TROPONINI in the last 168 hours. No results for input(s): TROPIPOC in the last 168 hours.  Chemistry Recent Labs  Lab 10/21/18 0409 10/22/18 0445  NA 136 140  K 3.5 3.3*  CL 104 103  CO2 18* 24  GLUCOSE 188* 132*  BUN 10 13  CREATININE 0.90 0.82  CALCIUM 10.3 9.3  GFRNONAA >60 >60  GFRAA >60 >60  ANIONGAP 14 13    No results for input(s): PROT, ALBUMIN, AST, ALT, ALKPHOS, BILITOT in the last 168 hours. Hematology Recent Labs  Lab 10/21/18 0409 10/22/18 0445  WBC 8.8 6.4  RBC 5.91* 4.76  HGB 11.5* 9.2*  HCT 37.1 30.5*  MCV 62.8* 64.1*  MCH 19.5* 19.3*  MCHC 31.0 30.2  RDW 19.6* 18.7*  PLT 238 182   BNPNo results for input(s): BNP, PROBNP in the last 168 hours.  DDimer No results for input(s): DDIMER in the last 168 hours.   Radiology/Studies:  Ct Angio Head W Or Wo Contrast  Result Date: 10/21/2018 CLINICAL DATA:  Stroke. History of endocarditis. Rule  out mycotic aneurysm. New cerebellar infarcts on CT. EXAM: CT ANGIOGRAPHY HEAD AND NECK TECHNIQUE: Multidetector CT imaging of the head and neck was performed using the standard protocol during bolus administration of intravenous contrast. Multiplanar CT image reconstructions and MIPs were obtained to evaluate the vascular anatomy. Carotid stenosis measurements (when applicable) are obtained utilizing NASCET criteria, using the distal internal carotid diameter as the denominator. CONTRAST:  764mOMNIPAQUE IOHEXOL 350 MG/ML SOLN COMPARISON:  CT head 10/21/2018 FINDINGS: CTA NECK FINDINGS Aortic arch: Atherosclerotic disease in the aortic arch. No aneurysm or dissection. Left vertebral artery origin from the arch. Right carotid system: Mild atherosclerotic disease right carotid bifurcation without significant stenosis. Left carotid system: Mild atherosclerotic  disease left carotid bifurcation without significant stenosis Vertebral arteries: Right vertebral artery dominant widely patent. Left vertebral artery origin from the arch and patent to the basilar without significant stenosis. Skeleton: no acute skeletal abnormality. Other neck: Negative Upper chest: Lung apices clear bilaterally. Review of the MIP images confirms the above findings CTA HEAD FINDINGS Anterior circulation: Atherosclerotic calcification in the cavernous carotid bilaterally without significant stenosis. Anterior and middle cerebral arteries patent bilaterally without significant stenosis or aneurysm Posterior circulation: Both vertebral arteries contribute to the basilar. PICA patent bilaterally. Left AICA patent. Basilar widely patent. Superior cerebellar and posterior cerebral arteries patent bilaterally. Venous sinuses: Normal venous enhancement Anatomic variants: Negative for cerebral aneurysm. Delayed phase: Not performed Review of the MIP images confirms the above findings IMPRESSION: Negative for intracranial large vessel occlusion. No  significant intracranial stenosis Mild atherosclerotic disease in the carotid bifurcation and cavernous carotid bilaterally without hemodynamically significant stenosis Both vertebral arteries patent to the basilar. Right vertebral artery dominant. Electronically Signed   By: [name] M.D.   On: 10/21/2018 13:01   Ct Head Wo Contrast  Result Date: 10/21/2018 CLINICAL DATA:  Acute headache with normal neuro exam EXAM: CT HEAD WITHOUT CONTRAST TECHNIQUE: Contiguous axial images were obtained from the base of the skull through the vertex without intravenous contrast. COMPARISON:  10/04/2018 FINDINGS: Brain: Small bilateral cerebellar infarcts have become apparent since prior. On axial slices there is question of a right insular cortex infarct, but this could also be explained by prominent motion at this level. No hydrocephalus, collection, or visible acute hemorrhage. Vascular: No hyperdense vessel. Skull: Negative Sinuses/Orbits: Clear sinuses IMPRESSION: 1. Small bilateral cerebellar infarcts have occurred since study earlier this month. 2. Equivocal for interval right insular infarct, certainty degraded by prominent motion artifact. Electronically Signed   By: Monte Fantasia M.D.   On: 10/21/2018 05:55   Ct Angio Neck W And/or Wo Contrast  Result Date: 10/21/2018 CLINICAL DATA:  Stroke. History of endocarditis. Rule out mycotic aneurysm. New cerebellar infarcts on CT. EXAM: CT ANGIOGRAPHY HEAD AND NECK TECHNIQUE: Multidetector CT imaging of the head and neck was performed using the standard protocol during bolus administration of intravenous contrast. Multiplanar CT image reconstructions and MIPs were obtained to evaluate the vascular anatomy. Carotid stenosis measurements (when applicable) are obtained utilizing NASCET criteria, using the distal internal carotid diameter as the denominator. CONTRAST:  45m OMNIPAQUE IOHEXOL 350 MG/ML SOLN COMPARISON:  CT head 10/21/2018 FINDINGS: CTA NECK FINDINGS  Aortic arch: Atherosclerotic disease in the aortic arch. No aneurysm or dissection. Left vertebral artery origin from the arch. Right carotid system: Mild atherosclerotic disease right carotid bifurcation without significant stenosis. Left carotid system: Mild atherosclerotic disease left carotid bifurcation without significant stenosis Vertebral arteries: Right vertebral artery dominant widely patent. Left vertebral artery origin from the arch and patent to the basilar without significant stenosis. Skeleton: no acute skeletal abnormality. Other neck: Negative Upper chest: Lung apices clear bilaterally. Review of the MIP images confirms the above findings CTA HEAD FINDINGS Anterior circulation: Atherosclerotic calcification in the cavernous carotid bilaterally without significant stenosis. Anterior and middle cerebral arteries patent bilaterally without significant stenosis or aneurysm Posterior circulation: Both vertebral arteries contribute to the basilar. PICA patent bilaterally. Left AICA patent. Basilar widely patent. Superior cerebellar and posterior cerebral arteries patent bilaterally. Venous sinuses: Normal venous enhancement Anatomic variants: Negative for cerebral aneurysm. Delayed phase: Not performed Review of the MIP images confirms the above findings IMPRESSION: Negative for intracranial large vessel occlusion. No significant intracranial  stenosis Mild atherosclerotic disease in the carotid bifurcation and cavernous carotid bilaterally without hemodynamically significant stenosis Both vertebral arteries patent to the basilar. Right vertebral artery dominant. Electronically Signed   By: [name] M.D.   On: 10/21/2018 13:01   Dg Chest Portable 1 View  Result Date: 10/21/2018 CLINICAL DATA:  Shortness of breath EXAM: PORTABLE CHEST 1 VIEW COMPARISON:  10/03/2018 FINDINGS: Normal heart size. Transcatheter aortic valve replacement. Left PICC with tip at the upper cavoatrial junction. There is no  edema, consolidation, effusion, or pneumothorax. Negative mediastinal contours. IMPRESSION: No evidence of acute disease. Electronically Signed   By: Monte Fantasia M.D.   On: 10/21/2018 04:25    Assessment and Plan:   Breanna Webster is a 73 y.o. female with a hx of CAD s/p mRCA (12/19), LBBB, GERD, Grave's disease, HTN, Hypothyroidism, IBS, DM AS s/p TAVR who is being seen today for the evaluation of Afib RVR at the request of Dr. Tawanna Solo.  1. New Afib RVR: Placed on Dilt drip on admission with conversion to SR. Now maintaining. Not on any rate controlled meds? Would favor BB therapy. This patients CHA2DS2-VASc Score of at least 6, therefore would favor [place]. Currently on IV heparin. Will follow with neurology recommendations on starting anticoagulation in the setting of newly dx Stroke.   2. Acute bilateral cerebellar Stroke: Noted on CT head on admission. Neurology following. Pending MRI  3. Bacteremia/endocarditis: veg on mitral valve noted on prior admission. Currently on 2/6 weeks of antibiotic therapy per ID. Planned for outpatient TEE with the completion on antibiotics.   4. AS s/p TAVR: had been on ASA/plavix historically. This has been held per neurology recommendations with new stroke. Would favor continued ASA in the setting of TAVR. Though will defer to neurology input regarding timing to resume.   5. CAD: PCI/DES x1 to the mRCA back in 12/19. Previously on plavix, but now has had at least 6 months of therapy. Reasonable to stop at this time.   For questions or updates, please contact [place] Please consult [email or web address] for contact info under   Signed, Reino Bellis, NP  10/22/2018 2:24 PM   Addendum: called back to patient's room. Was getting up to the bathroom and went into Afib RVR rates in the 180s.  -- will restart Dilt drip with 7m bolus. Plan to transition to oral meds in the morning if converts to SR.  -- continue IV heparin for now if safe from neurology  perspective.  ---------------------------------------------------------------------------------------------   History and all data above reviewed.  Patient examined.  I agree with the findings as above.  Breanna BEYis a 73year old female with CAD s/p RCA stent in Dec 2019, AS s/p TAVR, recent mitral valve endocarditis, HTN, on whom we were consulted for atrial fibrillation.  She has struggled with chronic dyspnea on exertion that was not resolved with revascularization, replacement of her aortic valve by TAVR.  She does not have severe abnormalities of her diastolic function on echo, and my independent review of the echocardiogram does not show definite evidence of constrictive features to contribute to shortness of breath.  She presented to the hospital on this admission with shortness of breath and was found to have both atrial fibrillation with rapid ventricular response which is new, as well as evidence of stroke.  Her atrial fibrillation converted to sinus rhythm with diltiazem IV.  Just as I was going to see the patient today she went back into a rapid  atrial fibrillation with shortness of breath.  On revisiting the patient she is comfortable but rates are still mildly elevated.   No chest pain, no palpitations with atrial fibrillation.  Primary symptom is shortness of breath.  She also presented with a headache.  Constitutional: No acute distress ENMT: normal dentition, moist mucous membranes Cardiovascular: Irregular rhythm, tachycardic rate, soft systolic murmur. S1 and S2 normal. Radial pulses normal bilaterally. No jugular venous distention.  Respiratory: clear to auscultation bilaterally GI : normal bowel sounds, soft and nontender. No distention.   MSK: extremities warm, well perfused. No edema.  NEURO: moves all extremities. PSYCH: alert and oriented x 3, normal mood and affect.   All available labs, radiology testing, previous records reviewed. Agree with documented  assessment and plan of my colleague as stated above with the following additions or changes:  Principal Problem:   Cerebellar stroke, acute ([place]) Active Problems:   Hypothyroidism   Dyslipidemia   Diabetes mellitus due to underlying condition, uncontrolled ([place])   Essential hypertension   S/P TAVR (transcatheter aortic valve replacement)   Bacteremia due to Streptococcus Salivarius   Atrial fibrillation with RVR ([facility])   Streptococcal endocarditis    Plan: Cardiology was consulted to assist in the management of atrial fibrillation, however upon our initial review she was in sinus rhythm.  She did however convert back to atrial fibrillation with rapid rates.  Perhaps because she converted to sinus rhythm her diltiazem infusion was discontinued, we will start this back again.  With regard to her antiplatelet and anticoagulation recommendations, we will defer on timing of initiation of antiplatelets and anticoagulation to neurology.  Of note the patient is currently on a heparin infusion presumably for atrial fibrillation.  I would recommend that the patient have indefinite low-dose aspirin 81 mg daily for the indication of prosthetic valve.  This can be reinitiated when felt safe from a neurology perspective.  The patient is completed 6 months of Plavix after stenting, and is completed several months of Plavix after TAVR.  To mitigate the risk of hemorrhagic conversion of stroke, it would be reasonable to fully discontinue Plavix at this time without plans to reinitiate.  Again please continue aspirin for the indication of stent placement, when safe from a neurologic perspective.  Finally, the patient has been started by the primary service on heparin.  The patient may be a candidate for anticoagulation with atrial fibrillation and this can be continued with aspirin when safe from a neuro perspective and in light of recent stroke.  She does seem to have some degenerative disease of the mitral  valve, however it does not appear that her atrial fibrillation is truly valvular in nature, therefore direct oral anticoagulation with Eliquis or Xarelto may be a reasonable option.  This can be discussed with the patient when appropriate to initiate.  She may need a home-going rate control strategy to avoid episodes of very high rates which will make her symptomatic.  Can consider oral beta-blockade after patient's rates are better controlled, for now would continue the diltiazem infusion.  We will follow along with the team as the patient continues to be in atrial fibrillation.  Length of Stay:  LOS: 1 day   Elouise Munroe, MD [facility] 9:36 PM  10/22/2018

## 2018-10-22 NOTE — Progress Notes (Addendum)
PROGRESS NOTE    Breanna Webster  BZJ:[id number] DOB: [birth year] DOA: 10/21/2018 PCP: [name], NP   Brief Narrative:   Patient is a 41 female with history of diabetes mellitus, aortic stenosis status post TAVR in 3/20, hypothyroidism, hypertension, pulmonary hypertension, coronary disease status post stent in 12/19 who presented with shortness of breath.  She was found to be in A. fib with RVR on presentation.  She was delirious and CT showed acute cerebral infarcts.   Neurology, ID following.  Cardiology consulted today.  Assessment & Plan:   Principal Problem:   Cerebellar stroke, acute ([place]) Active Problems:   Hypothyroidism   Dyslipidemia   Diabetes mellitus due to underlying condition, uncontrolled ([place])   Essential hypertension   S/P TAVR (transcatheter aortic valve replacement)   Bacteremia due to Streptococcus Salivarius   Atrial fibrillation with RVR ([facility])   Streptococcal endocarditis   A. fib with RVR: Started on Cardizem drip.  Currently heart rate is controlled.  Started on heparin drip for anticoagulation.  Cardiology consulted today.CHA2DS2-VASc Score is >2. Echocardiogram showed ejection fraction of 60 to 65%, left ventricular impaired relaxation.  Elevated troponin: Could be stated with A. fib with RVR or infectious endocarditis.  Low suspicion for ACS at this time.  Acute bilateral cerebellar strokes: Presented with delirium.  CT showed bilateral cerebral infarcts, new finding.  CTA negative.  Was initially started on aspirin and Plavix but since he has been started on heparin, neurology recommended to stop aspirin and Plavix. PT/OT consulted.MRi pending.  Bacteremia/endocarditis from Strep Salivarius Patient was admitted about 2 weeks ago with this issue. TEE showed apparent infectious endocarditis of the mitral valve without apparent infection of the TAVR valve. She completed course with gentamicin Rocephin is scheduled to continue through 7/28. ID  following Blood cultures from this admission are pending She is due to have repeat echo after completion of antibiotics  HTN -Allow permissive HTNfor now -Treat BP only if >220/120, and then with goal of 15% reduction -HoldCozaar, Bisoprolol, HCTZand plan to restart in 48-72 hours  HLD -LDL of 85 -Hold statin for now due to reported allergy of myalgias  DM -HbA1C of 7.5. -NPH 20 units daily for now, decreased from 40 due to NPO -Continue SSI  S/p TAVR -Continued on ASA 81 mg and Plavix with h/o DES in 12/19 -We will wait for recommendation from cardiology regarding combination of antiplatelets and anticoagulation on the background of new onset A. fib  Hypothyroidism - Now hyperthyroid.TSH of 0.322 and T4 of 1.78 .Will discontinue synthyroid for now. Check TSH in 3 months.May need to restart on low dose on discharge.           DVT prophylaxis: IV heparin Code Status: Full Family Communication: Husband on phone Disposition Plan: Awaiting cardiac evaluation, PT/OT evaluation.   Consultants: Neurology, ID  Procedures: MRI.  Antimicrobials:  Anti-infectives (From admission, onward)   Start     Dose/Rate Route Frequency Ordered Stop   10/22/18 1000  cefTRIAXone (ROCEPHIN) 2 g in sodium chloride 0.9 % 100 mL IVPB     2 g 200 mL/hr over 30 Minutes Intravenous Every 12 hours 10/22/18 0816     10/21/18 1400  cefTRIAXone (ROCEPHIN) 2 g in sodium chloride 0.9 % 100 mL IVPB  Status:  Discontinued     2 g 200 mL/hr over 30 Minutes Intravenous Every 24 hours 10/21/18 1300 10/22/18 0816   10/21/18 1400  gentamicin (GARAMYCIN) 80 mg in dextrose 5 % 100 mL IVPB  80 mg 102 mL/hr over 60 Minutes Intravenous Every 24 hours 10/21/18 1303 10/21/18 1551   10/21/18 1300  cefTRIAXone (ROCEPHIN) IVPB  Status:  Discontinued    Note to Pharmacy: Indication:  Strep bacteremia with endocarditis Last Day of Therapy:  11/18/18 Labs - Once weekly:  CBC/D and BMP, Labs - Every other  week:  ESR and CRP     2 g Intravenous Every 24 hours 10/21/18 1250 10/21/18 1258   10/21/18 1300  gentamicin (GARAMYCIN) IVPB  Status:  Discontinued    Note to Pharmacy: Indication:  Strep bacteremia with endocarditis Last Day of Therapy:  10/21/18 Labs - _0  mg Intravenous Every 24 hours 10/21/18 1250 10/21/18 1302      Subjective: Patient seen and examined at bedside this morning.  Hemodynamically stable.  Currently heart rate is well controlled.  She was in deep sleep during my evaluation.  She awoke after calling her name.  Currently she is alert and oriented.  She denies any complaints.  She does not have any focal neurological deficits.  Objective: Vitals:   10/22/18 0012 10/22/18 0314 10/22/18 0811 10/22/18 1100  BP: 121/85 133/60 120/64 (!) 126/59  Pulse: 62 85  87  Resp: _1 (!) 31  Temp: 98.2 F (36.8 C) 98 F (36.7 C) 98 F (36.7 C)   TempSrc: Oral Oral Oral   SpO2: 98% 96%  99%  Weight:  86.7 kg    Height:        Intake/Output Summary (Last 24 hours) at 10/22/2018 1257 Last data filed at 10/21/2018 1600 Gross per 24 hour  Intake 115.38 ml  Output --  Net 115.38 ml   Filed Weights   10/21/18 0407 10/22/18 0314  Weight: 89.8 kg 86.7 kg    Examination:  General exam: Appears calm and comfortable ,Not in distress,sleepy HEENT:PERRL,Oral mucosa moist, Ear/Nose normal on gross exam Respiratory system: Bilateral equal air entry, normal vesicular breath sounds, no wheezes or crackles  Cardiovascular system: Afib. No JVD, murmurs, rubs, gallops or clicks. No pedal edema. Gastrointestinal system: Abdomen is nondistended, soft and nontender. No organomegaly or masses felt. Normal bowel sounds heard. Central nervous system: Alert and oriented. No focal neurological deficits. Extremities: No edema, no clubbing ,no cyanosis, distal  peripheral pulses palpable. Skin: No rashes, lesions or ulcers,no icterus ,no pallor   Data Reviewed: I have personally reviewed following labs and imaging studies  CBC: Recent Labs  Lab 10/21/18 0409 10/22/18 0445  WBC 8.8 6.4  HGB 11.5* 9.2*  HCT 37.1 30.5*  MCV 62.8* 64.1*  PLT 238 381   Basic Metabolic Panel: Recent Labs  Lab 10/21/18 0409 10/22/18 0445  NA 136 140  K 3.5 3.3*  CL 104 103  CO2 18* 24  GLUCOSE 188* 132*  BUN 10 13  CREATININE 0.90 0.82  CALCIUM 10.3 9.3   GFR: Estimated Creatinine Clearance: 65.5 mL/min (by C-G formula based on SCr of 0.82 mg/dL). Liver Function Tests: No results for input(s): AST, ALT, ALKPHOS, BILITOT, PROT, ALBUMIN in the last 168 hours. No results for input(s): LIPASE, AMYLASE in the last 168 hours. No results for input(s): AMMONIA in the last 168 hours. Coagulation Profile: Recent Labs  Lab 10/21/18 0444  INR 1.2   Cardiac Enzymes: No results for input(s): CKTOTAL, CKMB, CKMBINDEX, TROPONINI in the last 168 hours. BNP (  last 3 results) Recent Labs    02/19/18 1233  PROBNP 270   HbA1C: Recent Labs    10/22/18 0445  HGBA1C 7.5*   CBG: Recent Labs  Lab 10/21/18 1716 10/21/18 2238 10/22/18 0629 10/22/18 0816 10/22/18 1225  GLUCAP 186* 98 131* 131* 95   Lipid Profile: Recent Labs    10/22/18 0445  CHOL 128  HDL 24*  LDLCALC 85  TRIG 95  CHOLHDL 5.3   Thyroid Function Tests: Recent Labs    10/21/18 1530  TSH 0.322*  FREET4 1.78*   Anemia Panel: No results for input(s): VITAMINB12, FOLATE, FERRITIN, TIBC, IRON, RETICCTPCT in the last 72 hours. Sepsis Labs: No results for input(s): PROCALCITON, LATICACIDVEN in the last 168 hours.  Recent Results (from the past 240 hour(s))  SARS Coronavirus 2 (CEPHEID - Performed in [facility] hospital lab), Hosp Order     Status: None   Collection Time: 10/21/18  6:04 AM   Specimen: Nasopharyngeal Swab  Result Value Ref [place] Status   SARS Coronavirus 2  NEGATIVE NEGATIVE Final    Comment: (NOTE) If result is NEGATIVE SARS-CoV-2 target nucleic acids are NOT DETECTED. The SARS-CoV-2 RNA is generally detectable in upper and lower  respiratory specimens during the acute phase of infection. The lowest  concentration of SARS-CoV-2 viral copies this assay can detect is 250  copies / mL. A negative result does not preclude SARS-CoV-2 infection  and should not be used as the sole basis for treatment or other  patient management decisions.  A negative result may occur with  improper specimen collection / handling, submission of specimen other  than nasopharyngeal swab, presence of viral mutation(s) within the  areas targeted by this assay, and inadequate number of viral copies  (<250 copies / mL). A negative result must be combined with clinical  observations, patient history, and epidemiological information. If result is POSITIVE SARS-CoV-2 target nucleic acids are DETECTED. The SARS-CoV-2 RNA is generally detectable in upper and lower  respiratory specimens dur ing the acute phase of infection.  Positive  results are indicative of active infection with SARS-CoV-2.  Clinical  correlation with patient history and other diagnostic information is  necessary to determine patient infection status.  Positive results do  not rule out bacterial infection or co-infection with other viruses. If result is PRESUMPTIVE POSTIVE SARS-CoV-2 nucleic acids MAY BE PRESENT.   A presumptive positive result was obtained on the submitted specimen  and confirmed on repeat testing.  While 2019 novel coronavirus  (SARS-CoV-2) nucleic acids may be present in the submitted sample  additional confirmatory testing may be necessary for epidemiological  and / or clinical management purposes  to differentiate between  SARS-CoV-2 and other Sarbecovirus currently known to infect humans.  If clinically indicated additional testing with an alternate test  methodology [id number])  is advised. The SARS-CoV-2 RNA is generally  detectable in upper and lower respiratory sp ecimens during the acute  phase of infection. The expected result is Negative. Fact Sheet for Patients:  StrictlyIdeas.no Fact Sheet for Healthcare Providers: BankingDealers.co.za This test is not yet approved or cleared by the [place] FDA and has been authorized for detection and/or diagnosis of SARS-CoV-2 by FDA under an Emergency Use Authorization (EUA).  This EUA will remain in effect (meaning this test can be used) for the duration of the COVID-19 declaration under Section 564(b)(1) of the Act, 21 U.S.C. section 360bbb-3(b)(1), unless the authorization is terminated or revoked sooner. Performed at [facility], [address] [place], [place] [address]   Culture, blood (routine x 2)     Status: None (Preliminary result)   Collection Time: 10/21/18  4:50 PM   Specimen: BLOOD  Result Value Ref [place] Status   Specimen Description BLOOD RIGHT ANTECUBITAL  Final   Special Requests AEROBIC BOTTLE ONLY Blood Culture adequate volume  Final   Culture   Final    NO GROWTH < 24 HOURS Performed at [facility], [place] [address], [place], [place] [address]    Report Status PENDING  Incomplete  Culture, blood (routine x 2)     Status: None (Preliminary result)   Collection Time: 10/21/18  4:55 PM   Specimen: BLOOD LEFT HAND  Result Value Ref [place] Status   Specimen Description BLOOD LEFT HAND  Final   Special Requests   Final    AEROBIC BOTTLE ONLY Blood Culture results may not be optimal due to an inadequate volume of blood received in culture bottles   Culture   Final    NO GROWTH < 24 HOURS Performed at [facility], [place] [address] [place], [place] [address]    Report Status PENDING  Incomplete         Radiology Studies: Ct Angio Head W Or Wo Contrast  Result Date: 10/21/2018 CLINICAL DATA:  Stroke. History of  endocarditis. Rule out mycotic aneurysm. New cerebellar infarcts on CT. EXAM: CT ANGIOGRAPHY HEAD AND NECK TECHNIQUE: Multidetector CT imaging of the head and neck was performed using the standard protocol during bolus administration of intravenous contrast. Multiplanar CT image reconstructions and MIPs were obtained to evaluate the vascular anatomy. Carotid stenosis measurements (when applicable) are obtained utilizing NASCET criteria, using the distal internal carotid diameter as the denominator. CONTRAST:  96m OMNIPAQUE IOHEXOL 350 MG/ML SOLN COMPARISON:  CT head 10/21/2018 FINDINGS: CTA NECK FINDINGS Aortic arch: Atherosclerotic disease in the aortic arch. No aneurysm or dissection. Left vertebral artery origin from the arch. Right carotid system: Mild atherosclerotic disease right carotid bifurcation without significant stenosis. Left carotid system: Mild atherosclerotic disease left carotid bifurcation without significant stenosis Vertebral arteries: Right vertebral artery dominant widely patent. Left vertebral artery origin from the arch and patent to the basilar without significant stenosis. Skeleton: no acute skeletal abnormality. Other neck: Negative Upper chest: Lung apices clear bilaterally. Review of the MIP images confirms the above findings CTA HEAD FINDINGS Anterior circulation: Atherosclerotic calcification in the cavernous carotid bilaterally without significant stenosis. Anterior and middle cerebral arteries patent bilaterally without significant stenosis or aneurysm Posterior circulation: Both vertebral arteries contribute to the basilar. PICA patent bilaterally. Left AICA patent. Basilar widely patent. Superior cerebellar and posterior cerebral arteries patent bilaterally. Venous sinuses: Normal venous enhancement Anatomic variants: Negative for cerebral aneurysm. Delayed phase: Not performed Review of the MIP images confirms the above findings IMPRESSION: Negative for intracranial large vessel  occlusion. No significant intracranial stenosis Mild atherosclerotic disease in the carotid bifurcation and cavernous carotid bilaterally without hemodynamically significant stenosis Both vertebral arteries patent to the basilar. Right vertebral artery dominant. Electronically Signed   By: CFranchot GalloM.D.   On: 10/21/2018 13:01   Ct Head Wo Contrast  Result Date: 10/21/2018 CLINICAL DATA:  Acute headache with normal neuro exam EXAM: CT HEAD WITHOUT CONTRAST TECHNIQUE: Contiguous axial images were obtained from the base of the skull through the vertex without intravenous contrast. COMPARISON:  10/04/2018 FINDINGS: Brain: Small bilateral cerebellar infarcts have become apparent since prior. On axial slices there is question of  a right insular cortex infarct, but this could also be explained by prominent motion at this level. No hydrocephalus, collection, or visible acute hemorrhage. Vascular: No hyperdense vessel. Skull: Negative Sinuses/Orbits: Clear sinuses IMPRESSION: 1. Small bilateral cerebellar infarcts have occurred since study earlier this month. 2. Equivocal for interval right insular infarct, certainty degraded by prominent motion artifact. Electronically Signed   By: Monte Fantasia M.D.   On: 10/21/2018 05:55   Ct Angio Neck W And/or Wo Contrast  Result Date: 10/21/2018 CLINICAL DATA:  Stroke. History of endocarditis. Rule out mycotic aneurysm. New cerebellar infarcts on CT. EXAM: CT ANGIOGRAPHY HEAD AND NECK TECHNIQUE: Multidetector CT imaging of the head and neck was performed using the standard protocol during bolus administration of intravenous contrast. Multiplanar CT image reconstructions and MIPs were obtained to evaluate the vascular anatomy. Carotid stenosis measurements (when applicable) are obtained utilizing NASCET criteria, using the distal internal carotid diameter as the denominator. CONTRAST:  9m OMNIPAQUE IOHEXOL 350 MG/ML SOLN COMPARISON:  CT head 10/21/2018 FINDINGS: CTA  NECK FINDINGS Aortic arch: Atherosclerotic disease in the aortic arch. No aneurysm or dissection. Left vertebral artery origin from the arch. Right carotid system: Mild atherosclerotic disease right carotid bifurcation without significant stenosis. Left carotid system: Mild atherosclerotic disease left carotid bifurcation without significant stenosis Vertebral arteries: Right vertebral artery dominant widely patent. Left vertebral artery origin from the arch and patent to the basilar without significant stenosis. Skeleton: no acute skeletal abnormality. Other neck: Negative Upper chest: Lung apices clear bilaterally. Review of the MIP images confirms the above findings CTA HEAD FINDINGS Anterior circulation: Atherosclerotic calcification in the cavernous carotid bilaterally without significant stenosis. Anterior and middle cerebral arteries patent bilaterally without significant stenosis or aneurysm Posterior circulation: Both vertebral arteries contribute to the basilar. PICA patent bilaterally. Left AICA patent. Basilar widely patent. Superior cerebellar and posterior cerebral arteries patent bilaterally. Venous sinuses: Normal venous enhancement Anatomic variants: Negative for cerebral aneurysm. Delayed phase: Not performed Review of the MIP images confirms the above findings IMPRESSION: Negative for intracranial large vessel occlusion. No significant intracranial stenosis Mild atherosclerotic disease in the carotid bifurcation and cavernous carotid bilaterally without hemodynamically significant stenosis Both vertebral arteries patent to the basilar. Right vertebral artery dominant. Electronically Signed   By: CFranchot GalloM.D.   On: 10/21/2018 13:01   Dg Chest Portable 1 View  Result Date: 10/21/2018 CLINICAL DATA:  Shortness of breath EXAM: PORTABLE CHEST 1 VIEW COMPARISON:  10/03/2018 FINDINGS: Normal heart size. Transcatheter aortic valve replacement. Left PICC with tip at the upper cavoatrial  junction. There is no edema, consolidation, effusion, or pneumothorax. Negative mediastinal contours. IMPRESSION: No evidence of acute disease. Electronically Signed   By: JMonte FantasiaM.D.   On: 10/21/2018 04:25        Scheduled Meds:  aspirin EC  81 mg Oral Daily   clopidogrel  75 mg Oral Q breakfast   escitalopram  20 mg Oral Daily   insulin aspart  0-15 Units Subcutaneous TID WC   insulin aspart  0-5 Units Subcutaneous QHS   insulin NPH Human  20 Units Subcutaneous QAC breakfast   levothyroxine  175 mcg Oral QAC breakfast   nortriptyline  25 mg Oral QHS   pantoprazole  40 mg Oral BID   potassium chloride  40 mEq Oral Once   Continuous Infusions:  sodium chloride 50 mL/hr at 10/21/18 1600   cefTRIAXone (ROCEPHIN)  IV 2 g (10/22/18 0929)   diltiazem (CARDIZEM) infusion 15 mg/hr (10/21/18 0634)  heparin 1,100 Units/hr (10/22/18 0619)     LOS: 1 day    Time spent: 35 mins.More than 50% of that time was spent in counseling and/or coordination of care.      Shelly Coss, MD [facility] Pager [phone]  If 7PM-7AM, please contact night-coverage [email or web address] Password [facility] 10/22/2018, 12:57 PM

## 2018-10-22 NOTE — ED Provider Notes (Signed)
Patient admitted to hospitalist service.  Plan for CT angiogram head and neck here in the emergency department.  Patient with delirium and difficult redirecting for scan to be of any quality.  Despite doses of Ativan and fentanyl in an attempt to obtain necessary imaging she ultimately required procedural sedation with etomidate.  She tolerated this well without difficulty.  A. fib with RVR now resolved.  Back in sinus rhythm.  Still with unclear etiology of delirium.  Patient will be admitted for additional work-up.  MRI pending.  Neurology consultation.  Hospitalist admission for new cerebellar infarcts.   Physical Exam  BP 133/60 (BP Location: Right Arm)   Pulse 85   Temp 98 F (36.7 C) (Oral)   Resp 19   Ht 5' 5"  (1.651 m)   Wt 86.7 kg   SpO2 96%   BMI 31.81 kg/m   Physical Exam  ED Course/Procedures     .Sedation Performed by: Jola Schmidt, MD Authorized by: Jola Schmidt, MD   Consent:    Consent obtained:  Verbal   Consent given by:  Patient   Risks discussed:  Nausea, vomiting, inadequate sedation, respiratory compromise necessitating ventilatory assistance and intubation, prolonged sedation necessitating reversal and prolonged hypoxia resulting in organ damage Universal protocol:    Immediately prior to procedure a time out was called: yes     Patient identity confirmation method:  Verbally with patient and arm band Indications:    Procedure performed:  Imaging studies   Procedure necessitating sedation performed by:  Physician performing sedation Pre-sedation assessment:    Time since last food or drink:  5 hours   ASA classification: class 2 - patient with mild systemic disease     Neck mobility: normal     Mouth opening:  2 finger widths   Mallampati score:  II - soft palate, uvula, fauces visible   Pre-sedation assessments completed and reviewed: airway patency, cardiovascular function, hydration status, nausea/vomiting, pain level and respiratory function    Immediate pre-procedure details:    Reassessment: Patient reassessed immediately prior to procedure     Reviewed: vital signs, relevant labs/tests and NPO status     Verified: bag valve mask available, emergency equipment available, intubation equipment available, IV patency confirmed, oxygen available and suction available   Procedure details (see MAR for exact dosages):    Preoxygenation:  Nasal cannula   Sedation:  Etomidate   Intra-procedure monitoring:  Blood pressure monitoring, cardiac monitor, continuous capnometry, continuous pulse oximetry, frequent LOC assessments and frequent vital sign checks   Intra-procedure events: none     Total Provider sedation time (minutes):  15 Post-procedure details:    Post-sedation assessments completed and reviewed: airway patency, cardiovascular function, hydration status, mental status, nausea/vomiting and respiratory function     Patient is stable for discharge or admission: yes     Patient tolerance:  Tolerated well, no immediate complications    MDM  Please see above discussion regarding medical decision making and handoff of care with ultimate admission to the hospitalist service      Jola Schmidt, MD 10/22/18 [phone]

## 2018-10-22 NOTE — Progress Notes (Addendum)
Patient c/o headache. Rates pain 6/10.  Tylenol given. Patient request medication to help "relax her". Patient is very restless. MD paged via [place]. Will continue to monitor.  Dr. Cheral Marker returned call, new orders received.   Emelda Fear, RN

## 2018-10-22 NOTE — Progress Notes (Signed)
STROKE TEAM PROGRESS NOTE   INTERVAL HISTORY Pt lying in bed, lethargic but orientated and cooperative with exam. Stated that b/l LE painful on touch. She stated that she came in for SOB and was told to have "a stroke".   Vitals:   10/21/18 1717 10/22/18 0012 10/22/18 0314 10/22/18 0811  BP: (!) 116/92 121/85 133/60 120/64  Pulse: (!) 56 62 85   Resp: 19 20 19 17   Temp:  98.2 F (36.8 C) 98 F (36.7 C) 98 F (36.7 C)  TempSrc:  Oral Oral Oral  SpO2: 98% 98% 96%   Weight:   86.7 kg   Height:        CBC:  Recent Labs  Lab 10/21/18 0409 10/22/18 0445  WBC 8.8 6.4  HGB 11.5* 9.2*  HCT 37.1 30.5*  MCV 62.8* 64.1*  PLT 238 884    Basic Metabolic Panel:  Recent Labs  Lab 10/21/18 0409 10/22/18 0445  NA 136 140  K 3.5 3.3*  CL 104 103  CO2 18* 24  GLUCOSE 188* 132*  BUN 10 13  CREATININE 0.90 0.82  CALCIUM 10.3 9.3   Lipid Panel:     Component Value Date/Time   CHOL 128 10/22/2018 0445   CHOL 156 11/21/2016 1113   TRIG 95 10/22/2018 0445   HDL 24 (L) 10/22/2018 0445   HDL 44 11/21/2016 1113   CHOLHDL 5.3 10/22/2018 0445   VLDL 19 10/22/2018 0445   LDLCALC 85 10/22/2018 0445   LDLCALC 91 11/21/2016 1113   HgbA1c:  Lab Results  Component Value Date   HGBA1C 7.5 (H) 10/22/2018   Urine Drug Screen:     Component Value Date/Time   LABOPIA NONE DETECTED 10/21/2018 1722   COCAINSCRNUR NONE DETECTED 10/21/2018 1722   LABBENZ POSITIVE (A) 10/21/2018 1722   AMPHETMU NONE DETECTED 10/21/2018 1722   THCU NONE DETECTED 10/21/2018 1722   LABBARB NONE DETECTED 10/21/2018 1722    Alcohol Level No results found for: ETH  IMAGING Ct Angio Head W Or Wo Contrast  Result Date: 10/21/2018 CLINICAL DATA:  Stroke. History of endocarditis. Rule out mycotic aneurysm. New cerebellar infarcts on CT. EXAM: CT ANGIOGRAPHY HEAD AND NECK TECHNIQUE: Multidetector CT imaging of the head and neck was performed using the standard protocol during bolus administration of intravenous  contrast. Multiplanar CT image reconstructions and MIPs were obtained to evaluate the vascular anatomy. Carotid stenosis measurements (when applicable) are obtained utilizing NASCET criteria, using the distal internal carotid diameter as the denominator. CONTRAST:  59m OMNIPAQUE IOHEXOL 350 MG/ML SOLN COMPARISON:  CT head 10/21/2018 FINDINGS: CTA NECK FINDINGS Aortic arch: Atherosclerotic disease in the aortic arch. No aneurysm or dissection. Left vertebral artery origin from the arch. Right carotid system: Mild atherosclerotic disease right carotid bifurcation without significant stenosis. Left carotid system: Mild atherosclerotic disease left carotid bifurcation without significant stenosis Vertebral arteries: Right vertebral artery dominant widely patent. Left vertebral artery origin from the arch and patent to the basilar without significant stenosis. Skeleton: no acute skeletal abnormality. Other neck: Negative Upper chest: Lung apices clear bilaterally. Review of the MIP images confirms the above findings CTA HEAD FINDINGS Anterior circulation: Atherosclerotic calcification in the cavernous carotid bilaterally without significant stenosis. Anterior and middle cerebral arteries patent bilaterally without significant stenosis or aneurysm Posterior circulation: Both vertebral arteries contribute to the basilar. PICA patent bilaterally. Left AICA patent. Basilar widely patent. Superior cerebellar and posterior cerebral arteries patent bilaterally. Venous sinuses: Normal venous enhancement Anatomic variants: Negative for cerebral aneurysm. Delayed  phase: Not performed Review of the MIP images confirms the above findings IMPRESSION: Negative for intracranial large vessel occlusion. No significant intracranial stenosis Mild atherosclerotic disease in the carotid bifurcation and cavernous carotid bilaterally without hemodynamically significant stenosis Both vertebral arteries patent to the basilar. Right vertebral  artery dominant. Electronically Signed   By: [name] M.D.   On: 10/21/2018 13:01   Ct Head Wo Contrast  Result Date: 10/21/2018 CLINICAL DATA:  Acute headache with normal neuro exam EXAM: CT HEAD WITHOUT CONTRAST TECHNIQUE: Contiguous axial images were obtained from the base of the skull through the vertex without intravenous contrast. COMPARISON:  10/04/2018 FINDINGS: Brain: Small bilateral cerebellar infarcts have become apparent since prior. On axial slices there is question of a right insular cortex infarct, but this could also be explained by prominent motion at this level. No hydrocephalus, collection, or visible acute hemorrhage. Vascular: No hyperdense vessel. Skull: Negative Sinuses/Orbits: Clear sinuses IMPRESSION: 1. Small bilateral cerebellar infarcts have occurred since study earlier this month. 2. Equivocal for interval right insular infarct, certainty degraded by prominent motion artifact. Electronically Signed   By: Monte Fantasia M.D.   On: 10/21/2018 05:55   Ct Angio Neck W And/or Wo Contrast  Result Date: 10/21/2018 CLINICAL DATA:  Stroke. History of endocarditis. Rule out mycotic aneurysm. New cerebellar infarcts on CT. EXAM: CT ANGIOGRAPHY HEAD AND NECK TECHNIQUE: Multidetector CT imaging of the head and neck was performed using the standard protocol during bolus administration of intravenous contrast. Multiplanar CT image reconstructions and MIPs were obtained to evaluate the vascular anatomy. Carotid stenosis measurements (when applicable) are obtained utilizing NASCET criteria, using the distal internal carotid diameter as the denominator. CONTRAST:  4m OMNIPAQUE IOHEXOL 350 MG/ML SOLN COMPARISON:  CT head 10/21/2018 FINDINGS: CTA NECK FINDINGS Aortic arch: Atherosclerotic disease in the aortic arch. No aneurysm or dissection. Left vertebral artery origin from the arch. Right carotid system: Mild atherosclerotic disease right carotid bifurcation without significant stenosis.  Left carotid system: Mild atherosclerotic disease left carotid bifurcation without significant stenosis Vertebral arteries: Right vertebral artery dominant widely patent. Left vertebral artery origin from the arch and patent to the basilar without significant stenosis. Skeleton: no acute skeletal abnormality. Other neck: Negative Upper chest: Lung apices clear bilaterally. Review of the MIP images confirms the above findings CTA HEAD FINDINGS Anterior circulation: Atherosclerotic calcification in the cavernous carotid bilaterally without significant stenosis. Anterior and middle cerebral arteries patent bilaterally without significant stenosis or aneurysm Posterior circulation: Both vertebral arteries contribute to the basilar. PICA patent bilaterally. Left AICA patent. Basilar widely patent. Superior cerebellar and posterior cerebral arteries patent bilaterally. Venous sinuses: Normal venous enhancement Anatomic variants: Negative for cerebral aneurysm. Delayed phase: Not performed Review of the MIP images confirms the above findings IMPRESSION: Negative for intracranial large vessel occlusion. No significant intracranial stenosis Mild atherosclerotic disease in the carotid bifurcation and cavernous carotid bilaterally without hemodynamically significant stenosis Both vertebral arteries patent to the basilar. Right vertebral artery dominant. Electronically Signed   By: CFranchot GalloM.D.   On: 10/21/2018 13:01   Dg Chest Portable 1 View  Result Date: 10/21/2018 CLINICAL DATA:  Shortness of breath EXAM: PORTABLE CHEST 1 VIEW COMPARISON:  10/03/2018 FINDINGS: Normal heart size. Transcatheter aortic valve replacement. Left PICC with tip at the upper cavoatrial junction. There is no edema, consolidation, effusion, or pneumothorax. Negative mediastinal contours. IMPRESSION: No evidence of acute disease. Electronically Signed   By: JMonte FantasiaM.D.   On: 10/21/2018 04:25    PHYSICAL  EXAM  Temp:  [97.9 F  (36.6 C)-98.2 F (36.8 C)] 97.9 F (36.6 C) (07/01 1634) Pulse Rate:  [49-117] 70 (07/01 1740) Resp:  [17-41] 34 (07/01 1715) BP: (120-176)/(59-112) 143/101 (07/01 1730) SpO2:  [94 %-100 %] 98 % (07/01 1740) Weight:  [86.7 kg] 86.7 kg (07/01 0314)  General - Well nourished, well developed, in no apparent distress, but lethargic.  Ophthalmologic - fundi not visualized due to noncooperation.  Cardiovascular - Regular rate and rhythm, not in afib on tele.  Mental Status -  Level of arousal and orientation to time, place, and person were intact. Language including expression, naming, repetition, comprehension was assessed and found intact.  Cranial Nerves II - XII - II - Visual field intact OU. III, IV, VI - Extraocular movements intact. V - Facial sensation intact bilaterally. VII - Facial movement intact bilaterally. VIII - Hearing & vestibular intact bilaterally. X - Palate elevates symmetrically. XI - Chin turning & shoulder shrug intact bilaterally. XII - Tongue protrusion intact.  Motor Strength - The patient's strength was 4/5 BUEs and 2/5 BLEs proximal and 4/5 distal. Bulk was normal and fasciculations were absent.   Motor Tone - Muscle tone was assessed at the neck and appendages and was normal.  Reflexes - The patient's reflexes were symmetrical in all extremities and she had no pathological reflexes.  Sensory - Light touch, temperature/pinprick were assessed and were symmetrical.  However, pinpoint sensation on bilateral dorsal feet and right foreleg.  Coordination - The patient had normal movements in the hands with no ataxia or dysmetria.  Tremor was absent.  Gait and Station - deferred.   ASSESSMENT/PLAN Breanna Webster is a 73 y.o. female with history of DVT, aortic stenosis s/p TAVR in March 2020, hypothyroidism, pulmonary hypertension, HTN, CADs/p stenting December 2019 recently admitted for infective endocarditis on the mitral valve treated with 2 weeks  gentamicin and ceftriaxone via PEG presenting to the hospital with increased shortness of breath and confusion x2 weeks along with headache.  No focal neurologic symptoms.  New A. Fib with RVR found in the ED and started on heparin drip.   Stroke:  Possible subacute bilateral cerebellar infarcts,  embolic secondary to new onset A. fib vs recent endocarditis   CT head small B cerebellar infarcts new since early June. possible R insular infarct.  CTA head & neck no LVO. Mild atherosclerosis B ICA bifurcations.  No mycotic aneurysm  MRI pending  2D Echo EF 60-65%. Poor windows. No mention of vegetation  LDL 85  HgbA1c 7.5  IV heparin for VTE prophylaxis  aspirin 81 mg daily and clopidogrel 75 mg daily prior to admission, now on aspirin 81 mg daily and heparin IV.   Therapy recommendations: Pending  Disposition: Pending  Atrial Fibrillation with RVR, new diagnosis  Home anticoagulation:  None   Currently on IV heparin  Pending MRI to decide timing of IV heparin switch to DOAC . Consider DOAC at discharge   Mitral valve bacteremia/endocarditis from strep salivarious  Admitted for sepsis 10/03/2018 and found to have mitral valve endocarditis on TEE 10/07/2018  Treated with 2 weeks gentamicin and ceftriaxone  ID consulted   CTA head and neck no mycotic aneurysm  Add additional 4 weeks ceftriaxone  Repeat echo 6/30 without mention of endocarditis   Hypertension  Stable . BP goal normotensive  Hyperlipidemia  Home meds: No statin listed  LDL 85, goal < 70  History of statin allergy with myalgias   Diabetes type II  Uncontrolled  HgbA1c 7.5, goal < 7.0  SSI  CBG monitoring  PCP follow-up for better DM control  Other Stroke Risk Factors  Advanced age  Obesity, Body mass index is 31.81 kg/m., recommend weight loss, diet and exercise as appropriate   Coronary artery disease s/p stent 03/2019 now on aspirin  MVR  S/p TAVR 06/2018  Other Active  Problems  Hypothyroidism  Hospital day # 1  I spent  35 minutes in total face-to-face time with the patient, more than 50% of which was spent in counseling and coordination of care, reviewing test results, images and medication, and discussing the diagnosis of stroke, A. fib RVR, recent endocarditis, uncontrolled diabetes, treatment plan and potential prognosis. This patient's care requiresreview of multiple databases, neurological assessment, discussion with family, other specialists and medical decision making of high complexity.  Rosalin Hawking, MD PhD Stroke Neurology 10/22/2018 6:40 PM     To contact Stroke Continuity provider, please refer to [email or web address] After hours, contact General Neurology

## 2018-10-22 NOTE — Progress Notes (Signed)
Initial Nutrition Assessment  RD working remotely.   DOCUMENTATION CODES:   Obesity unspecified  INTERVENTION:  Ensure Enlive po BID, each supplement provides 350 kcal and 20 grams of protein MVI   NUTRITION DIAGNOSIS:   Predicted suboptimal nutrient intake related to acute illness(recent history of endocarditis and pt reported SOB with dizziness and headaches x 2 weeks) as evidenced by percent weight loss.   GOAL:   Patient will meet greater than or equal to 90% of their needs   MONITOR:   PO intake, Labs, I & O's, Weight trends, Supplement acceptance  REASON FOR ASSESSMENT:   Consult Other (Comment)(stroke)  ASSESSMENT:  73 year old female with medical history significant of T2Dm, AS s/p TAVR in March, HTN, CAD s/p stent, GERD, IBS, Diverticulitis, Cholecystectomy, recent 6/12-6/19 admission for bacteremia w/ MV endocarditis presented to ED with SOB and delirium;  found to be in a-fib and CT revealed bilateral cerebellar infarcts.   Per chart review, pt reports increasing SOB, feeling dizzy, and a headache that has remains constant all over her head for the past 2 weeks. Neurology consulted for concerns of mycotic aneurysms given recent history of endocarditis. MRI pending - patient with agitation and uncooperative per MD 6/30 MD note.  RD called patient room today and unable to reach pt to obtain information about recent wt losses or diet history. Suspect pt has had decreased intake given recent hospitalization for endocarditis and current symptoms of SOB and headaches over the past 2 weeks. Per chart, pt is complaining of dizziness and nausea today.  Medications reviewed and include: SSI, NPH 20 units daily, protonix, potassium chloride 38mq tablet  Labs: Potassium 3.3 (L) - addressing Glucose 132 (H) Lab Results  Component Value Date   HGBA1C 7.5 (H) 10/22/2018   Weights reviewed: Current wt (190.7 lbs) noted to be stated with wts trending down over the past 3  months  3/16: 208.1 lbs 5/19: 201.5 lbs 6/14: 203.3 lbs 6/30: 197.6 lbs   NUTRITION - FOCUSED PHYSICAL EXAM:  Deferred at this time  Diet Order:   Diet Order            Diet heart healthy/carb modified Room service appropriate? No; Fluid consistency: Thin  Diet effective ____              EDUCATION NEEDS:   No education needs have been identified at this time  Skin:  Skin Assessment: Reviewed RN Assessment  Last BM:  unknown  Height:   Ht Readings from Last 1 Encounters:  10/21/18 5' 5"  (1.651 m)    Weight:   Wt Readings from Last 1 Encounters:  10/22/18 86.7 kg    Ideal Body Weight:  56.8 kg  BMI:  Body mass index is 31.81 kg/m.  Estimated Nutritional Needs:   Kcal:  1500-1700  Protein:  100-113g  Fluid:  >1.7L    SLajuan Lines RD, LDN  After Hours/Weekend Pager: [phone]

## 2018-10-22 NOTE — Evaluation (Signed)
Occupational Therapy Evaluation Patient Details Name: Breanna Webster MRN: [id number] DOB: December 17, 1945 Today's Date: 10/22/2018    History of Present Illness Patient is a 27 female with history of diabetes mellitus, aortic stenosis status post TAVR in 3/20, hypothyroidism, hypertension, pulmonary hypertension, coronary disease status post stent in 12/19 who presented with shortness of breath.  She was found to be in A. fib with RVR on presentation.  She was delirious and CT showed acute cerebral infarcts.   Clinical Impression   Pt admitted with the above diagnoses and presents with below problem list. Pt will benefit from continued acute OT to address the below listed deficits and maximize independence with basic ADLs prior to d/c home. PTA pt was supervision with functional mobility, sponge bathes, mod I with bathing/dressing. Pt is currently mod A with LB ADLs, min A for sidestepping along EOB using rw. Pt limited by lethargy this session.      Follow Up Recommendations  Home health OT;Supervision/Assistance - 24 hour    Equipment Recommendations  None recommended by OT    Recommendations for Other Services       Precautions / Restrictions Precautions Precautions: Fall Restrictions Weight Bearing Restrictions: No      Mobility Bed Mobility Overal bed mobility: Needs Assistance Bed Mobility: Supine to Sit;Sit to Supine     Supine to sit: Min assist Sit to supine: Min guard   General bed mobility comments: Pt reached for OT and pulled up on OT hand to come to sitting.   Transfers Overall transfer level: Needs assistance Equipment used: Rolling walker (2 wheeled) Transfers: Sit to/from Stand Sit to Stand: Mod assist         General transfer comment: mod A to powerup and steady. Pt drowsy today per observation and her report.     Balance Overall balance assessment: Needs assistance Sitting-balance support: No upper extremity supported;Feet supported Sitting  balance-Leahy Scale: Good     Standing balance support: Bilateral upper extremity supported;During functional activity Standing balance-Leahy Scale: Poor Standing balance comment: relied on UE support today                           ADL either performed or assessed with clinical judgement   ADL Overall ADL's : Needs assistance/impaired Eating/Feeding: Set up;Sitting   Grooming: Set up;Sitting   Upper Body Bathing: Min guard;Sitting   Lower Body Bathing: Moderate assistance;Sit to/from stand   Upper Body Dressing : Min guard;Sitting   Lower Body Dressing: Moderate assistance;Sit to/from stand   Toilet Transfer: Minimal Scientist, forensic Details (indicate cue type and reason): sidestepped towards head of bed Toileting- Clothing Manipulation and Hygiene: Moderate assistance;Sit to/from stand   Tub/ Shower Transfer: Minimal assistance;Moderate assistance     General ADL Comments: Pt completed bed mobility, sat EOB a few minutes, stood for about a minute and sidestepped towards HOB.     Vision   Additional Comments: sensitivity to light     Perception     Praxis      Pertinent Vitals/Pain Pain Assessment: Faces Faces Pain Scale: Hurts little more Pain Location: headache, points to R temporal area, since admission Pain Descriptors / Indicators: Aching Pain Intervention(s): Monitored during session;Repositioned     Hand Dominance Right   Extremity/Trunk Assessment Upper Extremity Assessment Upper Extremity Assessment: Overall WFL for tasks assessed   Lower Extremity Assessment Lower Extremity Assessment: Defer to PT evaluation   Cervical / Trunk Assessment Cervical / Trunk Assessment:  Normal   Communication Communication Communication: No difficulties   Cognition Arousal/Alertness: Lethargic Behavior During Therapy: Flat affect Overall Cognitive Status: Impaired/Different from baseline Area of Impairment: Memory;Problem  solving                 Orientation Level: Situation;Time   Memory: Decreased short-term memory   Safety/Judgement: Decreased awareness of safety   Problem Solving: Slow processing General Comments: Pt self reporting changes in cogntion "groggy" "I'm having a hard time recalling things"   General Comments  VSS    Exercises     Shoulder Instructions      Home Living Family/patient expects to be discharged to:: Private residence Living Arrangements: Spouse/significant other Available Help at Discharge: Family;Available 24 hours/day Type of Home: House Home Access: Stairs to enter;Level entry Entrance Stairs-Number of Steps: 4 Entrance Stairs-Rails: Right;Left Home Layout: One level     Bathroom Shower/Tub: Tub/shower unit;Walk-in shower("I have both")   Bathroom Toilet: Standard     Home Equipment: Walker - 2 wheels;Bedside commode;Grab bars - tub/shower   Additional Comments: info from previous stay. Pt giving conflicting information today.  Unsure if pt just lethargic vs. confused. Reporting to OT that she has no stairs at home (different than answer given to PT).      Prior Functioning/Environment Level of Independence: Independent        Comments: husband has had to walk with pt last  2 weeks and she has sponge bathed per pt        OT Problem List: Decreased activity tolerance;Impaired balance (sitting and/or standing);Decreased cognition;Decreased safety awareness;Decreased knowledge of use of DME or AE;Decreased knowledge of precautions      OT Treatment/Interventions: Self-care/ADL training;Therapeutic exercise;Energy conservation;DME and/or AE instruction;Therapeutic activities;Cognitive remediation/compensation;Patient/family education;Balance training    OT Goals(Current goals can be found in the care plan section) Acute Rehab OT Goals Patient Stated Goal: get home with her husband OT Goal Formulation: With patient Time For Goal Achievement:  11/05/18 Potential to Achieve Goals: Good ADL Goals Pt Will Perform Lower Body Bathing: with modified independence;sit to/from stand Pt Will Perform Lower Body Dressing: with modified independence;sit to/from stand Pt Will Transfer to Toilet: with modified independence;ambulating Pt Will Perform Toileting - Clothing Manipulation and hygiene: with modified independence;sit to/from stand  OT Frequency: Min 3X/week   Barriers to D/C:            Co-evaluation              AM-PAC OT "6 Clicks" Daily Activity     Outcome Measure Help from another person eating meals?: None Help from another person taking care of personal grooming?: A Little Help from another person toileting, which includes using toliet, bedpan, or urinal?: A Little Help from another person bathing (including washing, rinsing, drying)?: A Little Help from another person to put on and taking off regular upper body clothing?: A Little Help from another person to put on and taking off regular lower body clothing?: A Little 6 Click Score: 19   End of Session Equipment Utilized During Treatment: Rolling walker  Activity Tolerance: Patient tolerated treatment well;Patient limited by fatigue Patient left: in bed;with call bell/phone within reach;with bed alarm set  OT Visit Diagnosis: Unsteadiness on feet (R26.81);Other symptoms and signs involving cognitive function                Time: 1341-1355 OT Time Calculation (min): 14 min Charges:  OT General Charges $OT Visit: 1 Visit OT Evaluation $OT Eval Low Complexity: 1 Low  Tyrone Schimke, OT Acute Rehabilitation Services Pager: [phone] Office: [phone]   Hortencia Pilar 10/22/2018, 2:08 PM

## 2018-10-22 NOTE — Progress Notes (Signed)
Talked to the husband [name]) multiple times today. Stated that he has yet to talk with physician. Multiple nurses have called but no MD.  Informed husband that patient was resting comfortable at this time.

## 2018-10-22 NOTE — Evaluation (Signed)
Speech Language Pathology Evaluation Patient Details Name: Breanna Webster MRN: [id number] DOB: 1945-10-04 Today's Date: 10/22/2018 Time: 1694-5038 SLP Time Calculation (min) (ACUTE ONLY): 35 min  Problem List:  Patient Active Problem List   Diagnosis Date Noted  . Atrial fibrillation with RVR ([place]) 10/21/2018  . Cerebellar stroke, acute ([place]) 10/21/2018  . Streptococcal endocarditis   . Endocarditis of mitral valve 10/07/2018  . Bacteremia due to Streptococcus Salivarius 10/07/2018  . Sepsis ([place]) 10/04/2018  . Acute metabolic encephalopathy 88/28/0034  . Severe aortic stenosis 07/08/2018  . S/P TAVR (transcatheter aortic valve replacement) 07/08/2018  . Esophageal thickening   . CAD in native artery 04/22/2018  . Gastroesophageal reflux disease   . Pulmonary hypertension ([place]) 02/21/2018  . Essential hypertension 07/15/2017  . History of colonic polyps 05/22/2017  . Elevated liver function tests 12/05/2016  . Thalassemia minor 05/29/2016  . Left bundle branch block 12/06/2015  . Upper airway cough syndrome 10/14/2015  . Myalgia 02/17/2014  . Carotid artery stenosis 06/05/2013  . Eustachian tube dysfunction 05/07/2013  . Neuropathy of leg 03/07/2012  . Anemia, unspecified 01/31/2012  . Hot flashes 08/24/2010  . Diabetes mellitus due to underlying condition, uncontrolled ([place]) 06/30/2010  . Obesity 12/21/2009  . Vitamin D deficiency 03/10/2009  . Hypothyroidism 12/13/2008  . Dyslipidemia 12/13/2008  . Anxiety state 12/13/2008  . Other specified disorders of bladder 12/13/2008   Past Medical History:  Past Medical History:  Diagnosis Date  . Arthritis    "back" (04/22/2018)  . CAD (coronary artery disease)    a. 03/2018 s/p PCI/DES to the RCA (3.0x15 Onyx DES).  . Carotid artery stenosis    Mild  . Chronic lower back pain   . Colon polyps   . Diverticulitis   . Diverticulosis   . Esophageal thickening    seen on pre TAVR CT scan, also questionable cirrhosis.  MRI recommended. Will refer to GI  . GERD (gastroesophageal reflux disease)   . Grave's disease   . History of colonic polyps 05/22/2017  . History of hiatal hernia   . Hypertension   . Hypothyroidism   . IBS (irritable bowel syndrome)   . Osteopenia   . Pulmonary nodules    seen on pre TAVR CT. likley benign. no follow up recommended if pt low risk.  . S/P TAVR (transcatheter aortic valve replacement)   . Severe aortic stenosis   . Thalassemia minor   . Type II diabetes mellitus ([place])    Past Surgical History:  Past Surgical History:  Procedure Laterality Date  . [place] STUDY N/A 03/03/2018   Procedure: [place] STUDY;  Surgeon: Mauri Pole, MD;  Location: [facility];  Service: Endoscopy;  Laterality: N/A;  . COLONOSCOPY W/ BIOPSIES AND POLYPECTOMY    . CORONARY ANGIOGRAPHY Right 04/21/2018   Procedure: CORONARY ANGIOGRAPHY (CATH LAB);  Surgeon: [name], MD;  Location: [place] CV LAB;  Service: Cardiovascular;  Laterality: Right;  . CORONARY STENT INTERVENTION N/A 04/22/2018   Procedure: CORONARY STENT INTERVENTION;  Surgeon: [name], MD;  Location: [place] CV LAB;  Service: Cardiovascular;  Laterality: N/A;  . DILATION AND CURETTAGE OF UTERUS    . ESOPHAGEAL MANOMETRY N/A 03/03/2018   Procedure: ESOPHAGEAL MANOMETRY (EM);  Surgeon: Mauri Pole, MD;  Location: [facility];  Service: Endoscopy;  Laterality: N/A;  . HERNIA REPAIR    . HYSTEROSCOPY     fibroids  . LAPAROSCOPIC CHOLECYSTECTOMY    . LAPAROSCOPY  fibroids  . NISSEN FUNDOPLICATION  3704U  . RIGHT/LEFT HEART CATH AND CORONARY ANGIOGRAPHY N/A 02/20/2018   Procedure: RIGHT/LEFT HEART CATH AND CORONARY ANGIOGRAPHY;  Surgeon: [name], MD;  Location: [place] CV LAB;  Service: Cardiovascular;  Laterality: N/A;  . TEE WITHOUT CARDIOVERSION N/A 07/08/2018   Procedure: TRANSESOPHAGEAL ECHOCARDIOGRAM (TEE);  Surgeon: Burnell Blanks, MD;  Location: [place];   Service: Open Heart Surgery;  Laterality: N/A;  . TEE WITHOUT CARDIOVERSION  10/07/2018  . TEE WITHOUT CARDIOVERSION N/A 10/07/2018   Procedure: TRANSESOPHAGEAL ECHOCARDIOGRAM (TEE);  Surgeon: Jerline Pain, MD;  Location[facility];  Service: Cardiovascular;  Laterality: N/A;  . TONSILLECTOMY    . TRANSCATHETER AORTIC VALVE REPLACEMENT, TRANSFEMORAL N/A 07/08/2018   Procedure: TRANSCATHETER AORTIC VALVE REPLACEMENT, TRANSFEMORAL;  Surgeon: Burnell Blanks, MD;  Location: [place];  Service: Open Heart Surgery;  Laterality: N/A;   HPI:  Pt is a 73 y.o. female with medical history significant for DM, AS s/p TAVR in 3/20, hypothyroidism, HTN, pulmonary HTN, and CAD s/p stent in 12/19 who presented with SOB. She initially came in for SOB and was found to be in afib with RVR to 170-180 range.  She was delirious and CT showed small bilateral cerebellar infarcts which have occurred since study earlier this month.     Assessment / Plan / Recommendation Clinical Impression  Pt presents with notable deficits in functional problem-solving (managing brushing her teeth, using the phone/call bell, reading the menu and making selections/conveying info to listener).  Speech is clear without dysarthria.  She is alert, talkative, oriented to person/place, but not time.  Expressive/receptive language are WNL; speech is fluent.  She demonstrates mild deficits in working memory and attention. She will benefit from ongoing SLP and further diagnostic treatment while in acute care.      SLP Assessment  SLP Recommendation/Assessment: Patient needs continued Speech Lanaguage Pathology Services SLP Visit Diagnosis: Cognitive communication deficit (R41.841)    Follow Up Recommendations  Home health SLP    Frequency and Duration min 2x/week  1 week      SLP Evaluation Cognition  Overall Cognitive Status: Impaired/Different from baseline Arousal/Alertness: Awake/alert Orientation Level: Oriented to  person;Oriented to place;Disoriented to time;Oriented to situation Attention: Sustained Sustained Attention: Impaired Sustained Attention Impairment: Verbal complex Memory: Impaired Memory Impairment: Retrieval deficit Awareness: Appears intact Problem Solving: Impaired Problem Solving Impairment: Verbal complex Executive Function: Decision Making;Self Monitoring Decision Making: Impaired Decision Making Impairment: Verbal basic Self Monitoring: Impaired Self Monitoring Impairment: Verbal basic       Comprehension  Auditory Comprehension Overall Auditory Comprehension: Appears within functional limits for tasks assessed Reading Comprehension Reading Status: Within funtional limits    Expression Expression Primary Mode of Expression: Verbal Verbal Expression Overall Verbal Expression: Appears within functional limits for tasks assessed Written Expression Dominant Hand: Right   Oral / Motor  Oral Motor/Sensory Function Overall Oral Motor/Sensory Function: Mild impairment Facial Symmetry: Abnormal symmetry left Motor Speech Overall Motor Speech: Appears within functional limits for tasks assessed   GO                    Juan Quam Laurice 10/22/2018, 3:52 PM

## 2018-10-22 NOTE — Progress Notes (Signed)
ANTICOAGULATION CONSULT NOTE - Follow Up Consult  Pharmacy Consult for heparin Indication: atrial fibrillation and stroke  Labs: Recent Labs    10/21/18 0409 10/21/18 0444 10/21/18 0532 10/21/18 1141 10/22/18 0445  HGB 11.5*  --   --   --  9.2*  HCT 37.1  --   --   --  30.5*  PLT 238  --   --   --  182  APTT  --  31  --   --   --   LABPROT  --  15.1  --   --   --   INR  --  1.2  --   --   --   HEPARINUNFRC  --   --   --  <0.10* 0.21*  CREATININE 0.90  --   --   --  0.82  TROPONINIHS 959*  --  860*  --   --     Assessment: 73yo female subtherapeutic on heparin with initial dosing for stroke d/t Afib; no gtt issues or signs of bleeding per RN.  Goal of Therapy:  Heparin level 0.3-0.5 units/ml   Plan:  Will increase heparin gtt by 2-3 units/kgABW/hr to 1100 units/hr and check level in 6 hours.    Wynona Neat, PharmD, BCPS  10/22/2018,5:44 AM

## 2018-10-22 NOTE — Evaluation (Signed)
Clinical/Bedside Swallow Evaluation Patient Details  Name: Breanna Webster MRN: [id number] Date of Birth: [birth year]  Today's Date: 10/22/2018 Time: SLP Start Time (ACUTE ONLY): 0240 SLP Stop Time (ACUTE ONLY): 1445 SLP Time Calculation (min) (ACUTE ONLY): 23 min  Past Medical History:  Past Medical History:  Diagnosis Date  . Arthritis    "back" (04/22/2018)  . CAD (coronary artery disease)    a. 03/2018 s/p PCI/DES to the RCA (3.0x15 Onyx DES).  . Carotid artery stenosis    Mild  . Chronic lower back pain   . Colon polyps   . Diverticulitis   . Diverticulosis   . Esophageal thickening    seen on pre TAVR CT scan, also questionable cirrhosis. MRI recommended. Will refer to GI  . GERD (gastroesophageal reflux disease)   . Grave's disease   . History of colonic polyps 05/22/2017  . History of hiatal hernia   . Hypertension   . Hypothyroidism   . IBS (irritable bowel syndrome)   . Osteopenia   . Pulmonary nodules    seen on pre TAVR CT. likley benign. no follow up recommended if pt low risk.  . S/P TAVR (transcatheter aortic valve replacement)   . Severe aortic stenosis   . Thalassemia minor   . Type II diabetes mellitus ([place])    Past Surgical History:  Past Surgical History:  Procedure Laterality Date  . [place] STUDY N/A 03/03/2018   Procedure: [place] STUDY;  Surgeon: Mauri Pole, MD;  [facility];  Service: Endoscopy;  Laterality: N/A;  . COLONOSCOPY W/ BIOPSIES AND POLYPECTOMY    . CORONARY ANGIOGRAPHY Right 04/21/2018   Procedure: CORONARY ANGIOGRAPHY (CATH LAB);  Surgeon: [name], MD;  Location: [place] CV LAB;  Service: Cardiovascular;  Laterality: Right;  . CORONARY STENT INTERVENTION N/A 04/22/2018   Procedure: CORONARY STENT INTERVENTION;  Surgeon: [name], MD;  Location: [place] CV LAB;  Service: Cardiovascular;  Laterality: N/A;  . DILATION AND CURETTAGE OF UTERUS    . ESOPHAGEAL MANOMETRY N/A 03/03/2018   Procedure: ESOPHAGEAL MANOMETRY (EM);  Surgeon: Mauri Pole, MD;  [facility]: [facility];  Service: Endoscopy;  Laterality: N/A;  . HERNIA REPAIR    . HYSTEROSCOPY     fibroids  . LAPAROSCOPIC CHOLECYSTECTOMY    . LAPAROSCOPY     fibroids  . NISSEN FUNDOPLICATION  9735H  . RIGHT/LEFT HEART CATH AND CORONARY ANGIOGRAPHY N/A 02/20/2018   Procedure: RIGHT/LEFT HEART CATH AND CORONARY ANGIOGRAPHY;  Surgeon: [name], MD;  Location: [place] CV LAB;  Service: Cardiovascular;  Laterality: N/A;  . TEE WITHOUT CARDIOVERSION N/A 07/08/2018   Procedure: TRANSESOPHAGEAL ECHOCARDIOGRAM (TEE);  Surgeon: Burnell Blanks, MD;  Location: [place];  Service: Open Heart Surgery;  Laterality: N/A;  . TEE WITHOUT CARDIOVERSION  10/07/2018  . TEE WITHOUT CARDIOVERSION N/A 10/07/2018   Procedure: TRANSESOPHAGEAL ECHOCARDIOGRAM (TEE);  Surgeon: Jerline Pain, MD;  Location: [facility] ENDOSCOPY;  Service: Cardiovascular;  Laterality: N/A;  . TONSILLECTOMY    . TRANSCATHETER AORTIC VALVE REPLACEMENT, TRANSFEMORAL N/A 07/08/2018   Procedure: TRANSCATHETER AORTIC VALVE REPLACEMENT, TRANSFEMORAL;  Surgeon: Burnell Blanks, MD;  Location: [place];  Service: Open Heart Surgery;  Laterality: N/A;   HPI:  Pt is a 73 y.o. female with medical history significant for DM, AS s/p TAVR in 3/20, hypothyroidism, HTN, pulmonary HTN, and CAD s/p stent in 12/19 who presented with SOB. She initially came in for SOB and was found to  be in afib with RVR to 170-180 range.  She was delirious and CT showed small bilateral cerebellar infarcts which have occurred since study earlier this month.  Pt passed RN stroke swallow screen, but poor mentation created concerns for safe eating, hence SLP swallow orders.   Assessment / Plan / Recommendation Clinical Impression  Pt with generally functional swallow with normal oral mechanism exam (mild left asymmetry of lower face at rest). Pt much more alert this afternoon.  She sat at  the EOB and prepared meal tray, opened containers independently, self fed without difficulty.  Mastication was normal.  Swallow response was brisk.  There was a single occasion during which pt coughed; otherwise, swallow was unremarkable and pt appeared to be protecting her airway.  D/W RN.  Continue regular diet, thin liquids; pt needs a kosher diet.  Meds whole in liquids.  No SLP f/u for swallow is necessary.  SLP Visit Diagnosis: Dysphagia, unspecified (R13.10)    Aspiration Risk  No limitations    Diet Recommendation   regular diet, thin liquids  Medication Administration: Whole meds with liquid    Other  Recommendations Oral Care Recommendations: Oral care BID   Follow up Recommendations None      Frequency and Duration            Prognosis        Swallow Study   General Date of Onset: 10/22/18 HPI: Pt is a 73 y.o. female with medical history significant for DM, AS s/p TAVR in 3/20, hypothyroidism, HTN, pulmonary HTN, and CAD s/p stent in 12/19 who presented with SOB. She initially came in for SOB and was found to be in afib with RVR to 170-180 range.  She was delirious and CT showed small bilateral cerebellar infarcts which have occurred since study earlier this month.  Pt passed RN stroke swallow screen, but poor mentation created concerns for safe eating, hence SLP swallow orders. Type of Study: Bedside Swallow Evaluation Previous Swallow Assessment: MBS 7/19, revealed normal oropharyngeal swallow; stasis is cervical esophagus Diet Prior to this Study: Regular;Thin liquids Temperature Spikes Noted: No Respiratory Status: Room air History of Recent Intubation: No Behavior/Cognition: Alert;Cooperative Oral Cavity Assessment: Within Functional Limits Oral Care Completed by SLP: No Oral Cavity - Dentition: Adequate natural dentition Vision: Functional for self-feeding Self-Feeding Abilities: Able to feed self Patient Positioning: Upright in bed Baseline Vocal Quality:  Normal Volitional Cough: Strong Volitional Swallow: Able to elicit    Oral/Motor/Sensory Function Overall Oral Motor/Sensory Function: Mild impairment Facial Symmetry: Abnormal symmetry left(mild)   Ice Chips Ice chips: Within functional limits   Thin Liquid Thin Liquid: Within functional limits    Nectar Thick Nectar Thick Liquid: Not tested   Honey Thick Honey Thick Liquid: Not tested   Puree Puree: Within functional limits   Solid     Solid: Within functional limits      Juan Quam Laurice 10/22/2018,3:43 PM  Estill Bamberg L. Tivis Ringer, [place] Office number [phone] Pager [phone]

## 2018-10-23 ENCOUNTER — Other Ambulatory Visit: Payer: Self-pay | Admitting: Cardiology

## 2018-10-23 ENCOUNTER — Other Ambulatory Visit: Payer: Self-pay

## 2018-10-23 DIAGNOSIS — I480 Paroxysmal atrial fibrillation: Secondary | ICD-10-CM

## 2018-10-23 LAB — GLUCOSE, CAPILLARY
Glucose-Capillary: 117 mg/dL — ABNORMAL HIGH (ref 70–99)
Glucose-Capillary: 208 mg/dL — ABNORMAL HIGH (ref 70–99)

## 2018-10-23 LAB — HEPARIN LEVEL (UNFRACTIONATED)
Heparin Unfractionated: 0.55 IU/mL (ref 0.30–0.70)
Heparin Unfractionated: 0.68 IU/mL (ref 0.30–0.70)

## 2018-10-23 LAB — BASIC METABOLIC PANEL
Anion gap: 12 (ref 5–15)
BUN: 9 mg/dL (ref 8–23)
CO2: 22 mmol/L (ref 22–32)
Calcium: 9.1 mg/dL (ref 8.9–10.3)
Chloride: 105 mmol/L (ref 98–111)
Creatinine, Ser: 0.81 mg/dL (ref 0.44–1.00)
GFR calc Af Amer: >60 mL/min (ref >60)
GFR calc non Af Amer: >60 mL/min (ref >60)
Glucose, Bld: 209 mg/dL — ABNORMAL HIGH (ref 70–99)
Potassium: 3.2 mmol/L — ABNORMAL LOW (ref 3.5–5.1)
Sodium: 139 mmol/L (ref 135–145)

## 2018-10-23 LAB — MAGNESIUM: Magnesium: 1.7 mg/dL (ref 1.7–2.4)

## 2018-10-23 LAB — CBC
HCT: 29.6 % — ABNORMAL LOW (ref 36.0–46.0)
Hemoglobin: 9 g/dL — ABNORMAL LOW (ref 12.0–15.0)
MCH: 19.7 pg — ABNORMAL LOW (ref 26.0–34.0)
MCHC: 30.4 g/dL (ref 30.0–36.0)
MCV: 64.6 fL — ABNORMAL LOW (ref 80.0–100.0)
Platelets: 166 10*3/uL (ref 150–400)
RBC: 4.58 MIL/uL (ref 3.87–5.11)
RDW: 18.5 % — ABNORMAL HIGH (ref 11.5–15.5)
WBC: 5.7 10*3/uL (ref 4.0–10.5)
nRBC: 0 % (ref 0.0–0.2)

## 2018-10-23 LAB — IRON AND TIBC
Iron: 52 ug/dL (ref 28–170)
Saturation Ratios: 16 % (ref 10.4–31.8)
TIBC: 335 ug/dL (ref 250–450)
UIBC: 283 ug/dL

## 2018-10-23 LAB — FERRITIN: Ferritin: 45 ng/mL (ref 11–307)

## 2018-10-23 MED ORDER — DILTIAZEM HCL ER COATED BEADS 120 MG PO CP24
120.0000 mg | ORAL_CAPSULE | Freq: Every day | ORAL | Status: DC
  Administered 2018-10-23: 120 mg via ORAL
  Filled 2018-10-23: qty 1

## 2018-10-23 MED ORDER — DILTIAZEM HCL ER COATED BEADS 120 MG PO CP24: 120.0000 mg | ORAL_CAPSULE | Freq: Every day | ORAL | 0 refills | Status: DC

## 2018-10-23 MED ORDER — CEFTRIAXONE IV (FOR PTA / DISCHARGE USE ONLY): 2.0000 g | Freq: Two times a day (BID) | INTRAVENOUS | 0 refills | Status: DC

## 2018-10-23 MED ORDER — SODIUM CHLORIDE 0.9% FLUSH: 10.0000 mL | INTRAVENOUS | Status: DC | PRN

## 2018-10-23 MED ORDER — APIXABAN 5 MG PO TABS
5.0000 mg | ORAL_TABLET | Freq: Two times a day (BID) | ORAL | Status: DC
  Administered 2018-10-23: 5 mg via ORAL
  Filled 2018-10-23: qty 1

## 2018-10-23 MED ORDER — HEPARIN SOD (PORK) LOCK FLUSH 100 UNIT/ML IV SOLN
250.0000 [IU] | INTRAVENOUS | Status: AC | PRN
  Administered 2018-10-23: 250 [IU]

## 2018-10-23 MED ORDER — LEVOTHYROXINE SODIUM 75 MCG PO TABS: 75.0000 ug | ORAL_TABLET | Freq: Every day | ORAL | 0 refills | Status: DC

## 2018-10-23 MED ORDER — SODIUM CHLORIDE 0.9% FLUSH
10.0000 mL | Freq: Two times a day (BID) | INTRAVENOUS | Status: DC
  Administered 2018-10-23: 10 mL

## 2018-10-23 MED ORDER — APIXABAN 5 MG PO TABS: 5.0000 mg | ORAL_TABLET | Freq: Two times a day (BID) | ORAL | 0 refills | Status: DC

## 2018-10-23 NOTE — TOC Benefit Eligibility Note (Signed)
Transition of Care [facility]) Benefit Eligibility Note    Patient Details  Name: Breanna Webster MRN: [id number] Date of Birth: 01-12-1946   Medication/Dose: Ambrose Mantle  5 MG BID        Prescription Coverage Preferred Pharmacy: Roseanne Kaufman with Person/Company/Phone Number:: [facility] @ [facility]  #  [phone]  Co-Pay: $116.06             Memory Argue Phone Number: 10/23/2018, 3:10 PM

## 2018-10-23 NOTE — Discharge Instructions (Signed)

## 2018-10-23 NOTE — Progress Notes (Signed)
STROKE TEAM PROGRESS NOTE   INTERVAL HISTORY Pt lying in bed, lethargic but orientated and cooperative with exam. MRI showed embolic shower, but no large infarct. Will recommend heparin IV change to po DOAC.    Vitals:   10/22/18 2325 10/23/18 0425 10/23/18 0620 10/23/18 1223  BP: (!) 128/57 (!) 143/76  (!) 142/67  Pulse: 71 81    Resp: 20 (!) 21  18  Temp: 97.7 F (36.5 C) 98 F (36.7 C)  98.1 F (36.7 C)  TempSrc: Oral Oral  Tympanic  SpO2: 90% 92%  97%  Weight:  87.6 kg 87.7 kg   Height:        CBC:  Recent Labs  Lab 10/22/18 0445 10/23/18 0435  WBC 6.4 5.7  HGB 9.2* 9.0*  HCT 30.5* 29.6*  MCV 64.1* 64.6*  PLT 182 622    Basic Metabolic Panel:  Recent Labs  Lab 10/22/18 0445 10/23/18 1100  NA 140 139  K 3.3* 3.2*  CL 103 105  CO2 24 22  GLUCOSE 132* 209*  BUN 13 9  CREATININE 0.82 0.81  CALCIUM 9.3 9.1  MG  --  1.7   Lipid Panel:     Component Value Date/Time   CHOL 128 10/22/2018 0445   CHOL 156 11/21/2016 1113   TRIG 95 10/22/2018 0445   HDL 24 (L) 10/22/2018 0445   HDL 44 11/21/2016 1113   CHOLHDL 5.3 10/22/2018 0445   VLDL 19 10/22/2018 0445   LDLCALC 85 10/22/2018 0445   LDLCALC 91 11/21/2016 1113   HgbA1c:  Lab Results  Component Value Date   HGBA1C 7.5 (H) 10/22/2018   Urine Drug Screen:     Component Value Date/Time   LABOPIA NONE DETECTED 10/21/2018 1722   COCAINSCRNUR NONE DETECTED 10/21/2018 1722   LABBENZ POSITIVE (A) 10/21/2018 1722   AMPHETMU NONE DETECTED 10/21/2018 1722   THCU NONE DETECTED 10/21/2018 1722   LABBARB NONE DETECTED 10/21/2018 1722    Alcohol Level No results found for: [facility]  IMAGING Mr Brain Wo Contrast  Result Date: 10/22/2018 CLINICAL DATA:  73 y/o F; suspect cerebellar infarctions on CT. Atrial fibrillation RVR. EXAM: MRI HEAD WITHOUT CONTRAST TECHNIQUE: Axial DWI, coronal DWI, axial T2, axial SWI, and sagittal SPACE sequences were acquired. The patient declined to continue the examination and  additional sequences were not acquired. COMPARISON:  10/21/2018 CT head and CTA head. FINDINGS: Brain: Diffuse scattered subcentimeter foci of reduced diffusion and intermediate diffusion throughout the supratentorial brain, brainstem, and cerebellum including multiple lesions within the corpus callosum. A few lesions demonstrate T2 FLAIR hyperintense signal abnormality. No mass effect. No associated acute hemorrhage. Punctate foci of susceptibility hypointensity compatible with chronic microhemorrhage are present in the left frontal lobe, discrete from areas of infarction. No extra-axial collection, hydrocephalus, or herniation. Motion degraded SPACE sequence. Vascular: Normal flow voids. Skull and upper cervical spine: Normal marrow signal. Sinuses/Orbits: Negative. Other: None. IMPRESSION: Diffuse scattered subcentimeter foci of reduced diffusion and intermediate diffusion throughout the supratentorial brain, brainstem, and cerebellum including multiple lesions within the corpus callosum. Given age and history of atrial fibrillation, findings likely represent multiple embolic acute and subacute infarctions. Demyelination is considered unlikely. No hemorrhage or mass effect. These results will be called to the ordering clinician or representative by the Radiologist Assistant, and communication documented in the PACS or zVision Dashboard. Electronically Signed   By: Kristine Garbe M.D.   On: 10/22/2018 22:37    PHYSICAL EXAM  Temp:  [97.7 F (36.5 C)-98.3 F (  36.8 C)] 98.1 F (36.7 C) (07/02 1223) Pulse Rate:  [49-140] 81 (07/02 0425) Resp:  [15-41] 18 (07/02 1223) BP: (110-176)/(52-112) 142/67 (07/02 1223) SpO2:  [90 %-100 %] 97 % (07/02 1223) Weight:  [87.6 kg-87.7 kg] 87.7 kg (07/02 0620)  General - Well nourished, well developed, in no apparent distress, but lethargic.  Ophthalmologic - fundi not visualized due to noncooperation.  Cardiovascular - Regular rate and rhythm, not in  afib on tele.  Mental Status -  Level of arousal and orientation to time, place, and person were intact. Language including expression, naming, repetition, comprehension was assessed and found intact.  Cranial Nerves II - XII - II - Visual field intact OU. III, IV, VI - Extraocular movements intact. V - Facial sensation intact bilaterally. VII - Facial movement intact bilaterally. VIII - Hearing & vestibular intact bilaterally. X - Palate elevates symmetrically. XI - Chin turning & shoulder shrug intact bilaterally. XII - Tongue protrusion intact.  Motor Strength - The patient's strength was 4/5 BUEs and 2/5 BLEs proximal and 4/5 distal. Bulk was normal and fasciculations were absent.   Motor Tone - Muscle tone was assessed at the neck and appendages and was normal.  Reflexes - The patient's reflexes were symmetrical in all extremities and she had no pathological reflexes.  Sensory - Light touch, temperature/pinprick were assessed and were symmetrical.  However, pinpoint sensation on bilateral dorsal feet and right foreleg.  Coordination - The patient had normal movements in the hands with no ataxia or dysmetria.  Tremor was absent.  Gait and Station - deferred.   ASSESSMENT/PLAN [name] is a 73 y.o. female with history of DVT, aortic stenosis s/p TAVR in March 2020, hypothyroidism, pulmonary hypertension, HTN, CADs/p stenting December 2019 recently admitted for infective endocarditis on the mitral valve treated with 2 weeks gentamicin and ceftriaxone via PEG presenting to the hospital with increased shortness of breath and confusion x2 weeks along with headache.  No focal neurologic symptoms.  New A. Fib with RVR found in the ED and started on heparin drip.   Stroke: multifocal b/l anterior and posterior infarcts,  Cardio embolic secondary to new onset A. fib vs recent endocarditis   CT head small B cerebellar infarcts new since early June. possible R insular  infarct.  CTA head & neck no LVO. Mild atherosclerosis B ICA bifurcations.  No mycotic aneurysm  MRI Diffuse scattered subcentimeter foci of reduced diffusion and intermediate diffusion throughout the supratentorial brain, brainstem, and cerebellum including multiple lesions within the corpus callosum.  2D Echo EF 60-65%. Poor windows. No mention of vegetation  LDL 85  HgbA1c 7.5  IV heparin for VTE prophylaxis  aspirin 81 mg daily and clopidogrel 75 mg daily prior to admission, now on aspirin 81 mg daily and heparin IV. Given small size of infarct, will recommend switch heparin IV to DOAC.   Therapy recommendations: Pending  Disposition: Pending  Atrial Fibrillation with RVR, new diagnosis  Home anticoagulation:  None   Recommend to switch IV heparin to PO DOAC  MRI no large infarct . Consider DOAC at discharge   Mitral valve bacteremia/endocarditis from strep salivarious  Admitted for sepsis 10/03/2018 and found to have mitral valve endocarditis on TEE 10/07/2018  Treated with 2 weeks gentamicin and ceftriaxone  ID consulted   CTA head and neck no mycotic aneurysm  Add additional 4 weeks ceftriaxone  Repeat echo 6/30 without mention of endocarditis   Hypertension  Stable . BP goal normotensive  Hyperlipidemia  Home meds: No statin listed  LDL 85, goal < 70  History of statin allergy with myalgias   Diabetes type II Uncontrolled  HgbA1c 7.5, goal < 7.0  SSI  CBG monitoring  PCP follow-up for better DM control  Other Stroke Risk Factors  Advanced age  Obesity, Body mass index is 32.17 kg/m., recommend weight loss, diet and exercise as appropriate   Coronary artery disease s/p stent 03/2019 now on aspirin  MVR  S/p TAVR 06/2018  Other Active Problems  Hypothyroidism  Hospital day # 2  Neurology will sign off. Please call with questions. Pt will follow up with stroke clinic NP at [facility] in about 4 weeks. Thanks for the consult.   Rosalin Hawking, MD PhD Stroke Neurology 10/23/2018 2:10 PM     To contact Stroke Continuity provider, please refer to [email or web address] After hours, contact General Neurology

## 2018-10-23 NOTE — Progress Notes (Addendum)
Progress Note  Patient Name: Breanna Webster Date of Encounter: 10/23/2018  Primary Cardiologist: Sinclair Grooms, MD   Subjective   No chest pain, wanting to know when to go home.    Inpatient Medications    Scheduled Meds:  aspirin EC  81 mg Oral Daily   escitalopram  20 mg Oral Daily   feeding supplement (ENSURE ENLIVE)  237 mL Oral BID BM   insulin aspart  0-15 Units Subcutaneous TID WC   insulin aspart  0-5 Units Subcutaneous QHS   insulin NPH Human  20 Units Subcutaneous QAC breakfast   multivitamin with minerals  1 tablet Oral Daily   nortriptyline  25 mg Oral QHS   pantoprazole  40 mg Oral BID   potassium chloride  40 mEq Oral Once   Continuous Infusions:  sodium chloride 50 mL/hr at 10/22/18 2222   cefTRIAXone (ROCEPHIN)  IV 2 g (10/23/18 0917)   diltiazem (CARDIZEM) infusion Stopped (10/22/18 2100)   heparin 1,250 Units/hr (10/23/18 0918)   PRN Meds: acetaminophen **OR** acetaminophen (TYLENOL) oral liquid 160 mg/5 mL **OR** acetaminophen, lip balm, senna-docusate, zolpidem   Vital Signs    Vitals:   10/22/18 2105 10/22/18 2325 10/23/18 0425 10/23/18 0620  BP: 114/63 (!) 128/57 (!) 143/76   Pulse: 63 71 81   Resp: 16 20 (!) 21   Temp: 98.3 F (36.8 C) 97.7 F (36.5 C) 98 F (36.7 C)   TempSrc: Oral Oral Oral   SpO2: 100% 90% 92%   Weight:   87.6 kg 87.7 kg  Height:        Intake/Output Summary (Last 24 hours) at 10/23/2018 1015 Last data filed at 10/23/2018 0400 Gross per 24 hour  Intake 2127.06 ml  Output --  Net 2127.06 ml   Last 3 Weights 10/23/2018 10/23/2018 10/22/2018  Weight (lbs) 193 lb 5.5 oz 193 lb 2 oz 191 lb 2.2 oz  Weight (kg) 87.7 kg 87.6 kg 86.7 kg      Telemetry    A fib to SR with 5 sec conversion pause - Personally Reviewed  ECG    A fib with RVR and LBBB  - Personally Reviewed  Physical Exam   GEN: No acute distress.  sleepy Neck: No JVD Cardiac: RRR, 2/6 systolic murmur, no rubs, or gallops.   Respiratory: Clear to auscultation bilaterally. GI: Soft, nontender, non-distended  MS: No edema; No deformity. Neuro:  Nonfocal, follows commands, MAE Psych: Normal affect   Labs    High Sensitivity Troponin:   Recent Labs  Lab 10/21/18 0409 10/21/18 0532  TROPONINIHS 959* 860*      Cardiac EnzymesNo results for input(s): TROPONINI in the last 168 hours. No results for input(s): TROPIPOC in the last 168 hours.   Chemistry Recent Labs  Lab 10/21/18 0409 10/22/18 0445  NA 136 140  K 3.5 3.3*  CL 104 103  CO2 18* 24  GLUCOSE 188* 132*  BUN 10 13  CREATININE 0.90 0.82  CALCIUM 10.3 9.3  GFRNONAA >60 >60  GFRAA >60 >60  ANIONGAP 14 13     Hematology Recent Labs  Lab 10/21/18 0409 10/22/18 0445 10/23/18 0435  WBC 8.8 6.4 5.7  RBC 5.91* 4.76 4.58  HGB 11.5* 9.2* 9.0*  HCT 37.1 30.5* 29.6*  MCV 62.8* 64.1* 64.6*  MCH 19.5* 19.3* 19.7*  MCHC 31.0 30.2 30.4  RDW 19.6* 18.7* 18.5*  PLT 238 182 166    BNPNo results for input(s): BNP, PROBNP in the last  168 hours.   DDimer No results for input(s): DDIMER in the last 168 hours.   Radiology    Ct Angio Head W Or Wo Contrast  Result Date: 10/21/2018 CLINICAL DATA:  Stroke. History of endocarditis. Rule out mycotic aneurysm. New cerebellar infarcts on CT. EXAM: CT ANGIOGRAPHY HEAD AND NECK TECHNIQUE: Multidetector CT imaging of the head and neck was performed using the standard protocol during bolus administration of intravenous contrast. Multiplanar CT image reconstructions and MIPs were obtained to evaluate the vascular anatomy. Carotid stenosis measurements (when applicable) are obtained utilizing NASCET criteria, using the distal internal carotid diameter as the denominator. CONTRAST:  33m OMNIPAQUE IOHEXOL 350 MG/ML SOLN COMPARISON:  CT head 10/21/2018 FINDINGS: CTA NECK FINDINGS Aortic arch: Atherosclerotic disease in the aortic arch. No aneurysm or dissection. Left vertebral artery origin from the arch. Right  carotid system: Mild atherosclerotic disease right carotid bifurcation without significant stenosis. Left carotid system: Mild atherosclerotic disease left carotid bifurcation without significant stenosis Vertebral arteries: Right vertebral artery dominant widely patent. Left vertebral artery origin from the arch and patent to the basilar without significant stenosis. Skeleton: no acute skeletal abnormality. Other neck: Negative Upper chest: Lung apices clear bilaterally. Review of the MIP images confirms the above findings CTA HEAD FINDINGS Anterior circulation: Atherosclerotic calcification in the cavernous carotid bilaterally without significant stenosis. Anterior and middle cerebral arteries patent bilaterally without significant stenosis or aneurysm Posterior circulation: Both vertebral arteries contribute to the basilar. PICA patent bilaterally. Left AICA patent. Basilar widely patent. Superior cerebellar and posterior cerebral arteries patent bilaterally. Venous sinuses: Normal venous enhancement Anatomic variants: Negative for cerebral aneurysm. Delayed phase: Not performed Review of the MIP images confirms the above findings IMPRESSION: Negative for intracranial large vessel occlusion. No significant intracranial stenosis Mild atherosclerotic disease in the carotid bifurcation and cavernous carotid bilaterally without hemodynamically significant stenosis Both vertebral arteries patent to the basilar. Right vertebral artery dominant. Electronically Signed   By: CFranchot GalloM.D.   On: 10/21/2018 13:01   Ct Angio Neck W And/or Wo Contrast  Result Date: 10/21/2018 CLINICAL DATA:  Stroke. History of endocarditis. Rule out mycotic aneurysm. New cerebellar infarcts on CT. EXAM: CT ANGIOGRAPHY HEAD AND NECK TECHNIQUE: Multidetector CT imaging of the head and neck was performed using the standard protocol during bolus administration of intravenous contrast. Multiplanar CT image reconstructions and MIPs were  obtained to evaluate the vascular anatomy. Carotid stenosis measurements (when applicable) are obtained utilizing NASCET criteria, using the distal internal carotid diameter as the denominator. CONTRAST:  749mOMNIPAQUE IOHEXOL 350 MG/ML SOLN COMPARISON:  CT head 10/21/2018 FINDINGS: CTA NECK FINDINGS Aortic arch: Atherosclerotic disease in the aortic arch. No aneurysm or dissection. Left vertebral artery origin from the arch. Right carotid system: Mild atherosclerotic disease right carotid bifurcation without significant stenosis. Left carotid system: Mild atherosclerotic disease left carotid bifurcation without significant stenosis Vertebral arteries: Right vertebral artery dominant widely patent. Left vertebral artery origin from the arch and patent to the basilar without significant stenosis. Skeleton: no acute skeletal abnormality. Other neck: Negative Upper chest: Lung apices clear bilaterally. Review of the MIP images confirms the above findings CTA HEAD FINDINGS Anterior circulation: Atherosclerotic calcification in the cavernous carotid bilaterally without significant stenosis. Anterior and middle cerebral arteries patent bilaterally without significant stenosis or aneurysm Posterior circulation: Both vertebral arteries contribute to the basilar. PICA patent bilaterally. Left AICA patent. Basilar widely patent. Superior cerebellar and posterior cerebral arteries patent bilaterally. Venous sinuses: Normal venous enhancement Anatomic variants: Negative for cerebral  aneurysm. Delayed phase: Not performed Review of the MIP images confirms the above findings IMPRESSION: Negative for intracranial large vessel occlusion. No significant intracranial stenosis Mild atherosclerotic disease in the carotid bifurcation and cavernous carotid bilaterally without hemodynamically significant stenosis Both vertebral arteries patent to the basilar. Right vertebral artery dominant. Electronically Signed   By: [name]  M.D.   On: 10/21/2018 13:01   Mr Brain Wo Contrast  Result Date: 10/22/2018 CLINICAL DATA:  73 y/o F; suspect cerebellar infarctions on CT. Atrial fibrillation RVR. EXAM: MRI HEAD WITHOUT CONTRAST TECHNIQUE: Axial DWI, coronal DWI, axial T2, axial SWI, and sagittal SPACE sequences were acquired. The patient declined to continue the examination and additional sequences were not acquired. COMPARISON:  10/21/2018 CT head and CTA head. FINDINGS: Brain: Diffuse scattered subcentimeter foci of reduced diffusion and intermediate diffusion throughout the supratentorial brain, brainstem, and cerebellum including multiple lesions within the corpus callosum. A few lesions demonstrate T2 FLAIR hyperintense signal abnormality. No mass effect. No associated acute hemorrhage. Punctate foci of susceptibility hypointensity compatible with chronic microhemorrhage are present in the left frontal lobe, discrete from areas of infarction. No extra-axial collection, hydrocephalus, or herniation. Motion degraded SPACE sequence. Vascular: Normal flow voids. Skull and upper cervical spine: Normal marrow signal. Sinuses/Orbits: Negative. Other: None. IMPRESSION: Diffuse scattered subcentimeter foci of reduced diffusion and intermediate diffusion throughout the supratentorial brain, brainstem, and cerebellum including multiple lesions within the corpus callosum. Given age and history of atrial fibrillation, findings likely represent multiple embolic acute and subacute infarctions. Demyelination is considered unlikely. No hemorrhage or mass effect. These results will be called to the ordering clinician or representative by the Radiologist Assistant, and communication documented in the PACS or zVision Dashboard. Electronically Signed   By: Kristine Garbe M.D.   On: 10/22/2018 22:37    Cardiac Studies   10/21/18 echo  Sonographer Comments: Technically difficult study due to poor echo windows. Uncooperative and altered mental  status. IMPRESSIONS    1. The left ventricle has normal systolic function, with an ejection fraction of 60-65%. The cavity size was normal. There is mildly increased left ventricular wall thickness. Left ventricular diastolic Doppler parameters are consistent with impaired  relaxation. Indeterminate filling pressures.  2. There is moderate mitral annular calcification present. Mild mitral valve stenosis.  SUMMARY   Limited study due to very poor echo windows and poor patient cooperation due to altered mental status. Grossly normal LVEF 60-65%. Moderately calcified mitral valve with mild stenosis and mild regurgitation, grade 1 DD, indeterminate LV filling pressure, otherwise cannot comment on valves or atrial septum. Consider TEE or alternate imaging if cardiac source of stroke is suspected.  FINDINGS  Left Ventricle: The left ventricle has normal systolic function, with an ejection fraction of 60-65%. The cavity size was normal. There is mildly increased left ventricular wall thickness. Left ventricular diastolic Doppler parameters are consistent  with impaired relaxation (grade I). Indeterminate filling pressures.    Mitral Valve: There is moderate mitral annular calcification present. Mitral valve regurgitation is mild by color flow Doppler. Mild mitral valve stenosis.     TEE 10/07/18  PROCEDURE: The transesophogeal probe was passed through the esophogus of the patient. The patient developed no complications during the procedure.  IMPRESSIONS    1. The right ventricle has normal systolc function. There is no increase in right ventricular wall thickness.  2. Left atrial size was mildly dilated.  3. No evidence of a thrombus present in the left atrial appendage.  4. Mildly thickened  tricuspid valve leaflets.  5. The mitral valve is degenerative. Mitral valve regurgitation is mild to moderate by color flow Doppler. A moderate pedunculated and mobile vegetation is seen on the  anterior mitral leaflet. The vegetation measures 11 mm by 18 mm.  6. 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 Tricuspid valve regurgitation is mild-moderate.  7. A 50m Edwards Sapien bioprosthetic aortic valve (TAVR) valve is present in the aortic position. Procedure Date: 07/08/2018 Normal aortic valve prosthesis.  8. The left ventricle has normal systolic function, with an ejection fraction of 60-65%. No evidence of left ventricular regional wall motion abnormalities.  SUMMARY   Mitral valve endocarditis  FINDINGS  Left Ventricle: The left ventricle has normal systolic function, with an ejection fraction of 60-65%. No evidence of left ventricular regional wall motion abnormalities.  Right Ventricle: The right ventricle has normal systolic function. There is no increase in right ventricular wall thickness.  Left Atrium: Left atrial size was mildly dilated.   Left Atrial Appendage: No evidence of a thrombus present in the left atrial appendage.  Right Atrium: Right atrial size was normal in size. Right atrial pressure is estimated at 10 mmHg.  Interatrial Septum: No atrial level shunt detected by color flow Doppler.  Pericardium: There is no evidence of pericardial effusion.  Mitral Valve: The mitral valve is degenerative in appearance. Mitral valve regurgitation is mild to moderate by color flow Doppler. A moderate pedunculated and mobile vegetation is seen on the anterior mitral leaflet. The vegetation measures 11 mm by 18  mm. There is.  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 Tricuspid valve regurgitation is mild-moderate by color flow Doppler. The tricuspid valve is mildly thickened.  Aortic Valve: The aortic valve has been repaired/replaced Aortic valve regurgitation was not visualized by color flow Doppler. There is no evidence of aortic valve stenosis. A 262mEdwards Sapien bioprosthetic, stented aortic valve (TAVR) valve is  present in the aortic position.  Procedure Date: 07/08/2018 Normal aortic valve prosthesis.  Pulmonic Valve: The pulmonic valve was normal in structure. Pulmonic valve regurgitation is not visualized by color flow Doppler.    Patient Profile     [age].o. female with a hx of CAD s/p mRCA (12/19), LBBB, GERD, Grave's disease, HTN, Hypothyroidism, IBS, DM AS s/p TAVR  now with acute bilateral cerebellar stroke and a fib with RVR along with bacteremia for mitral valve vegetation.  On 2/6 weeks ABX.    Assessment & Plan    A fib with RVR has been in and out, on dilt drip dilt has been stopped.  IV heparin continues.  Did have at least 5 sec conversion pause yesterday going to SR.  CHA2DS2-VASc Score of at least 6 will need NOAC at discharge per neuro note.    Embolic bilateral cerebellar infarcts MV vegetation (bacteremia/endocarditis)  on second week of ABX , no vegetation seen on TTE yesterday    HTN  From 128/57 to 143/76 per neuro   Hypokalemia yesterday  Will recheck now.  AS s/p TAVR  CAD with PCI/DES x1 to the mRCA back in 12/19. Previously on plavix, but now has had at least 6 months of therapy. Reasonable to stop at this time   For questions or updates, please contact [facility] consult [email or web address] for contact info under     Signed, LaCecilie KicksNP  10/23/2018, 10:15 AM    ---------------------------------------------------------------------------------------------   History and all data above reviewed.  Patient examined.  I agree with the findings as above.  Loeta Herst Domangue is back in sinus rhythm. Had a conversion pause but has normal BP and HR currently.   Constitutional: No acute distress Eyes: pupils equally round and reactive to light, sclera non-icteric, normal conjunctiva and lids ENMT: normal dentition, moist mucous membranes Cardiovascular: regular rhythm, normal rate, soft systolic murmur. S1 and S2 normal. Radial pulses normal bilaterally. No jugular venous distention.    Respiratory: clear to auscultation bilaterally GI : normal bowel sounds, soft and nontender. No distention.   MSK: extremities warm, well perfused. No edema.  NEURO: grossly nonfocal exam, moves all extremities. PSYCH: alert and oriented x 3, normal mood and affect.   All available labs, radiology testing, previous records reviewed. Agree with documented assessment and plan of my colleague as stated above with the following additions or changes:  Principal Problem:   Cerebellar stroke, acute ([place]) Active Problems:   Hypothyroidism   Dyslipidemia   Diabetes mellitus due to underlying condition, uncontrolled ([place])   Essential hypertension   S/P TAVR (transcatheter aortic valve replacement)   Bacteremia due to Streptococcus Salivarius   Atrial fibrillation with RVR ([facility])   Streptococcal endocarditis   Plan:  Afib RVR - new this hospitalization. On IV heparin - would convert to [place] when safe from neurology perspective. Continue ASA 81 mg daily given previous stent, if no bleeding contraindications from neurology. Can discontinue plavix at this time.  If patient tolerates, will give diltiazem sustained release 120 mg daily for rate control. If she does poorly with long conversion pauses (3-5 seconds or greater) or symptoms of hypotension or bradycardia, can discontinue without issue. Will obtain an extended monitor to determine burden of afib as outpatient as well as frequency of conversion pauses.   HTN - diltiazem may assist with this as well, but is not mandatory if hypotension, conversion pauses or bradycardia are symptomatic.  [facility] will sign off.   Medication Recommendations:   ASA 81 mg daily for prior stent DOAC at dismissal for afib  Diltiazem 120 mg daily for rate control and hypertension. Other recommendations (labs, testing, etc):  Discontinue plavix. Obtain 2 week extended cardiac monitor.  Follow up as an outpatient:  11/12/2018 with Tommas Olp NP.   Time Spent  Directly with Patient:  I have spent a total of 35 minutes with the patient reviewing hospital notes, telemetry, EKGs, labs and examining the patient as well as establishing an assessment and plan that was discussed personally with the patient.  > 50% of time was spent in direct patient care.  Length of Stay:  LOS: 2 days   Elouise Munroe, MD [facility] 1:27 PM  10/23/2018

## 2018-10-23 NOTE — Patient Outreach (Signed)
[facility]) Care Management  10/23/2018  AUBRIELLA PEREZGARCIA 01-16-1946 [id number]   EMMI:general discharge red alert Referral date:10/15/18 Referral reason:  Scheduled follow up: NO,  Lost interest in things: yes Day #4 Insurance: Medicare   Telephone call to patient regarding EMMI general discharge red alert. HIPAA verified by patients husband, Celita Aron.  Husband states patient has been readmitted to the hospital due to a stroke.  Husband states patient had stroke due to Atrial fibrillation per the doctors. He states Atrial fibrillation is a new diagnosis for patient. Husband states patient may be discharged over the weekend.  RNCM offered follow up post hospital discharge.   PLAN: RNCM will notify [facility] hospital liaison of patients readmission  Quinn Plowman RN,BSN,CCM [facility] Telephonic  [phone]

## 2018-10-23 NOTE — Discharge Summary (Signed)
Physician Discharge Summary  BRODIE SCOVELL XNA:[id number] DOB: 12/02/1945 DOA: 10/21/2018  PCP: [name], NP  Admit date: 10/21/2018 Discharge date: 10/23/2018  Admitted From: Home Disposition:  Home  Discharge Condition:Stable CODE STATUS:FULL Diet recommendation: Heart Healthy  Brief/Interim Summary:   Patient is a 73 female with history of diabetes mellitus, aortic stenosis status post TAVR in 3/20, hypothyroidism, hypertension, pulmonary hypertension, coronary disease status post stent in 12/19 who presented with shortness of breath.  She was found to be in A. fib with RVR on presentation.  She was delirious and CT showed acute cerebellar infarcts.  Neurology,   Cardiology consulted .  Interestingly, patient does not have any focal neurological deficits.  Seen by physical therapy and recommended home health on discharge.  Currently her heart rate is well controlled.  She has been started on oral Cardizem and Eliquis for anticoagulation.  She will follow-up with neurology and cardiology as an outpatient.  She is hemodynamically stable for discharge today to home.  Following problems were addressed during her hospitalization  A. fib with RVR: Started on Cardizem drip.  Currently heart rate is controlled.  Started on heparin drip for anticoagulation.  Cardiology consulted .CHA2DS2-VASc Scoreis >2. Echocardiogram showed ejection fraction of 60 to 65%, left ventricular impaired relaxation. Heparin drip was stopped and she has been started on Eliquis.  Started on oral Cardizem.  She will follow-up with cardiology as an outpatient  Elevated troponin: Could be stated with A. fib with RVR or infectious endocarditis.  Low suspicion for ACS at this time.  Acute bilateral cerebellar strokes: Presented with delirium.  CT showed bilateral cerebral infarcts, new finding.  CTA negative.    She was on aspirin and Plavix at home.  Plavix has been discontinued.  She will continue on aspirin and  Eliquis.  She is not on a statin due to allergy and myalgias.  Her LDL is 85.  Her hemoglobin A1c 7.5.  Bacteremia/endocarditis from Strep Salivarius Patient was admitted about 2 weeks ago with this issue. TEE showed apparent infectious endocarditis of the mitral valve without apparent infection of the TAVR valve. She completed course with gentamicin Rocephin is scheduled to continue through 7/28. ID following Blood cultures from this admission are pending She is due to have repeat echo after completion of antibiotics  HTN -On hydrochlorothiazide, bisoprolol, losartan at home.  Bisoprolol will be discontinued as she has been started on Cardizem.  HLD -LDL of 85 -Hold statin for now due to reported allergy , myalgias  DM -HbA1C of 7.5. -Continue home regimen  S/p TAVR -Follow up with cardiology.  She follows with Dr. Tamala Julian  Hypothyroidism - Now hyperthyroid.TSH of 0.322 and T4 of 1.78 .Will decrease the dose to 75 mcg.  Check TSH,T4,T3 in a month.   Discharge Diagnoses:  Principal Problem:   Cerebellar stroke, acute ([place]) Active Problems:   Hypothyroidism   Dyslipidemia   Diabetes mellitus due to underlying condition, uncontrolled ([place])   Essential hypertension   S/P TAVR (transcatheter aortic valve replacement)   Bacteremia due to Streptococcus Salivarius   Atrial fibrillation with RVR ([place])   Streptococcal endocarditis    Discharge Instructions  Discharge Instructions    Ambulatory referral to Neurology   Complete by: As directed    Follow up with stroke clinic NP (Jessica Vanschaick or [name], if both not available, consider Zachery Dauer, or [place]) at [facility] in about 4 weeks. Thanks.   Ambulatory referral to Neurology   Complete by: As directed  An appointment is requested in approximately: 4 weeks   Diet - low sodium heart healthy   Complete by: As directed    Discharge instructions   Complete by: As directed    1)Please follow up with your  PCP in a week. 2)Follow up with cardiology on 11/12/2018.  You will be called for appointment. 3)Follow up with [facility] neurology in 4 weeks.  Name and number of the provider group has been attached. 4)Take prescribed medications as instructed. 5)Check TSH,free T3 and T4 in 4 weeks.   Increase activity slowly   Complete by: As directed      Allergies as of 10/23/2018      Reactions   Statins Other (See Comments)   Muscle aches      Medication List    STOP taking these medications   bisoprolol 5 MG tablet Commonly known as: ZEBETA   clopidogrel 75 MG tablet Commonly known as: PLAVIX   gentamicin  IVPB Commonly known as: GARAMYCIN   sodium chloride 0.9 %     TAKE these medications   acetaminophen 500 MG tablet Commonly known as: TYLENOL Take 1,000 mg by mouth every 6 (six) hours as needed (for pain.).   apixaban 5 MG Tabs tablet Commonly known as: ELIQUIS Take 1 tablet (5 mg total) by mouth 2 (two) times daily.   aspirin 81 MG tablet Take 81 mg by mouth daily.   cefTRIAXone  IVPB Commonly known as: ROCEPHIN Inject 2 g into the vein daily. Indication:  Strep bacteremia with endocarditis Last Day of Therapy:  11/18/18 Labs - Once weekly:  CBC/D and BMP, Labs - Every other week:  ESR and CRP   Chlor-Trimeton 4 MG tablet Generic drug: chlorpheniramine Take 4 mg by mouth at bedtime.   cholecalciferol 1000 units tablet Commonly known as: VITAMIN D Take 1,000 Units by mouth daily.   diltiazem 120 MG 24 hr capsule Commonly known as: CARDIZEM CD Take 1 capsule (120 mg total) by mouth daily.   escitalopram 20 MG tablet Commonly known as: LEXAPRO Take 1 tablet (20 mg total) by mouth daily.   ferrous sulfate 325 (65 FE) MG tablet Take 1 tablet (325 mg total) by mouth 2 (two) times daily with a meal.   hydrochlorothiazide 12.5 MG tablet Commonly known as: HYDRODIURIL Take 1 tablet (12.5 mg total) by mouth daily.   insulin NPH Human 100 UNIT/ML injection Commonly  known as: NovoLIN N ReliOn Inject 0.4 mLs (40 Units total) into the skin every morning. And syringes 1/day   Insulin Syringe-Needle U-100 31G X 5/16" 1 ML Misc Commonly known as: BD Insulin Syringe U/F USE TO INJECT INSULIN ONCE DAILY   levothyroxine 75 MCG tablet Commonly known as: Synthroid Take 1 tablet (75 mcg total) by mouth daily. What changed:   medication strength  how much to take   losartan 50 MG tablet Commonly known as: COZAAR TAKE 1 TABLET(50 MG) BY MOUTH DAILY What changed: See the new instructions.   multivitamin with minerals Tabs tablet Take 1 tablet by mouth daily.   nitroGLYCERIN 0.4 MG SL tablet Commonly known as: Nitrostat Place 1 tablet (0.4 mg total) under the tongue every 5 (five) minutes as needed for chest pain.   nortriptyline 25 MG capsule Commonly known as: PAMELOR TAKE 1 CAPSULE(25 MG) BY MOUTH AT BEDTIME What changed: See the new instructions.   OneTouch Delica Lancets 01S Misc USE TO CHECK BLOOD SUGAR TWICE DAILY What changed: See the new instructions.   OneTouch Verio test  strip Generic drug: glucose blood USE TO CHECK BLOOD SUGAR TWICE DAILY What changed: See the new instructions.   pantoprazole 40 MG tablet Commonly known as: PROTONIX Take 1 tablet (40 mg total) by mouth 2 (two) times daily.      Follow-up Information    [facility]. Schedule an appointment as soon as possible for a visit in 4 week(s).   Specialty: Neurology Contact information: [address] [place] [phone]       [name], [place]. Schedule an appointment as soon as possible for a visit in 1 week(s).   Specialty: Family Medicine Contact information: [place] [phone]          Allergies  Allergen Reactions  . Statins Other (See Comments)    Muscle aches    Consultations:  Cardiology, neurology   Procedures/Studies: Ct Abdomen Pelvis Wo  Contrast  Result Date: 10/04/2018 CLINICAL DATA:  Sepsis EXAM: CT ABDOMEN AND PELVIS WITHOUT CONTRAST TECHNIQUE: Multidetector CT imaging of the abdomen and pelvis was performed following the standard protocol without IV contrast. COMPARISON:  06/17/2018 FINDINGS: Lower chest: No acute abnormality. Hepatobiliary: There is hepatic steatosis. The liver appears nodular in contour. The patient is status post prior cholecystectomy. Pancreas: The pancreas is atrophic and fatty replaced. Spleen: The spleen is enlarged measuring approximately 14 cm craniocaudad. Adrenals/Urinary Tract: Adrenal glands are unremarkable. Kidneys are normal, without renal calculi, focal lesion, or hydronephrosis. Bladder is unremarkable. Detection of stones is limited by the presence of IV contrast from prior contrast enhanced CT of the chest. Stomach/Bowel: There is sigmoid diverticulosis without CT evidence of diverticulitis. The appendix is unremarkable. The stomach is unremarkable. There is no evidence of a small-bowel obstruction. Vascular/Lymphatic: Aortic atherosclerosis. No enlarged abdominal or pelvic lymph nodes. Reproductive: Uterus and bilateral adnexa are unremarkable. Other: No abdominal wall hernia or abnormality. No abdominopelvic ascites. Musculoskeletal: No acute or significant osseous findings. IMPRESSION: 1. No acute intra-abdominal abnormality detected. No findings to explain the patient's sepsis. 2. Cirrhotic appearing liver with stigmata of portal hypertension including splenomegaly. 3. Status post cholecystectomy. 4. Normal appendix in the right lower quadrant. 5. No hydronephrosis. 6.  Choose 1 Electronically Signed   By: Constance Holster M.D.   On: 10/04/2018 03:03   Ct Angio Head W Or Wo Contrast  Result Date: 10/21/2018 CLINICAL DATA:  Stroke. History of endocarditis. Rule out mycotic aneurysm. New cerebellar infarcts on CT. EXAM: CT ANGIOGRAPHY HEAD AND NECK TECHNIQUE: Multidetector CT imaging of the head  and neck was performed using the standard protocol during bolus administration of intravenous contrast. Multiplanar CT image reconstructions and MIPs were obtained to evaluate the vascular anatomy. Carotid stenosis measurements (when applicable) are obtained utilizing NASCET criteria, using the distal internal carotid diameter as the denominator. CONTRAST:  29m OMNIPAQUE IOHEXOL 350 MG/ML SOLN COMPARISON:  CT head 10/21/2018 FINDINGS: CTA NECK FINDINGS Aortic arch: Atherosclerotic disease in the aortic arch. No aneurysm or dissection. Left vertebral artery origin from the arch. Right carotid system: Mild atherosclerotic disease right carotid bifurcation without significant stenosis. Left carotid system: Mild atherosclerotic disease left carotid bifurcation without significant stenosis Vertebral arteries: Right vertebral artery dominant widely patent. Left vertebral artery origin from the arch and patent to the basilar without significant stenosis. Skeleton: no acute skeletal abnormality. Other neck: Negative Upper chest: Lung apices clear bilaterally. Review of the MIP images confirms the above findings CTA HEAD FINDINGS Anterior circulation: Atherosclerotic calcification in the cavernous carotid bilaterally  without significant stenosis. Anterior and middle cerebral arteries patent bilaterally without significant stenosis or aneurysm Posterior circulation: Both vertebral arteries contribute to the basilar. PICA patent bilaterally. Left AICA patent. Basilar widely patent. Superior cerebellar and posterior cerebral arteries patent bilaterally. Venous sinuses: Normal venous enhancement Anatomic variants: Negative for cerebral aneurysm. Delayed phase: Not performed Review of the MIP images confirms the above findings IMPRESSION: Negative for intracranial large vessel occlusion. No significant intracranial stenosis Mild atherosclerotic disease in the carotid bifurcation and cavernous carotid bilaterally without  hemodynamically significant stenosis Both vertebral arteries patent to the basilar. Right vertebral artery dominant. Electronically Signed   By: [name] M.D.   On: 10/21/2018 13:01   Dg Orthopantogram  Result Date: 10/07/2018 CLINICAL DATA:  Mitral valve endocarditis. EXAM: ORTHOPANTOGRAM/PANORAMIC COMPARISON:  None. FINDINGS: Multiple dental implants and amalgam are noted. No dental caries or periapical lucencies. IMPRESSION: No evidence of tooth infection. Electronically Signed   By: Titus Dubin M.D.   On: 10/07/2018 19:43   Ct Head Wo Contrast  Result Date: 10/21/2018 CLINICAL DATA:  Acute headache with normal neuro exam EXAM: CT HEAD WITHOUT CONTRAST TECHNIQUE: Contiguous axial images were obtained from the base of the skull through the vertex without intravenous contrast. COMPARISON:  10/04/2018 FINDINGS: Brain: Small bilateral cerebellar infarcts have become apparent since prior. On axial slices there is question of a right insular cortex infarct, but this could also be explained by prominent motion at this level. No hydrocephalus, collection, or visible acute hemorrhage. Vascular: No hyperdense vessel. Skull: Negative Sinuses/Orbits: Clear sinuses IMPRESSION: 1. Small bilateral cerebellar infarcts have occurred since study earlier this month. 2. Equivocal for interval right insular infarct, certainty degraded by prominent motion artifact. Electronically Signed   By: Monte Fantasia M.D.   On: 10/21/2018 05:55   Ct Head Wo Contrast  Result Date: 10/04/2018 CLINICAL DATA:  Encephalopathy EXAM: CT HEAD WITHOUT CONTRAST TECHNIQUE: Contiguous axial images were obtained from the base of the skull through the vertex without intravenous contrast. COMPARISON:  None. FINDINGS: Brain: Mild age related volume loss. No acute intracranial abnormality. Specifically, no hemorrhage, hydrocephalus, mass lesion, acute infarction, or significant intracranial injury. Vascular: No hyperdense vessel or  unexpected calcification. Skull: No acute calvarial abnormality. Sinuses/Orbits: Visualized paranasal sinuses and mastoids clear. Orbital soft tissues unremarkable. Other: None IMPRESSION: No acute intracranial abnormality. Electronically Signed   By: Rolm Baptise M.D.   On: 10/04/2018 03:51   Ct Angio Neck W And/or Wo Contrast  Result Date: 10/21/2018 CLINICAL DATA:  Stroke. History of endocarditis. Rule out mycotic aneurysm. New cerebellar infarcts on CT. EXAM: CT ANGIOGRAPHY HEAD AND NECK TECHNIQUE: Multidetector CT imaging of the head and neck was performed using the standard protocol during bolus administration of intravenous contrast. Multiplanar CT image reconstructions and MIPs were obtained to evaluate the vascular anatomy. Carotid stenosis measurements (when applicable) are obtained utilizing NASCET criteria, using the distal internal carotid diameter as the denominator. CONTRAST:  17m OMNIPAQUE IOHEXOL 350 MG/ML SOLN COMPARISON:  CT head 10/21/2018 FINDINGS: CTA NECK FINDINGS Aortic arch: Atherosclerotic disease in the aortic arch. No aneurysm or dissection. Left vertebral artery origin from the arch. Right carotid system: Mild atherosclerotic disease right carotid bifurcation without significant stenosis. Left carotid system: Mild atherosclerotic disease left carotid bifurcation without significant stenosis Vertebral arteries: Right vertebral artery dominant widely patent. Left vertebral artery origin from the arch and patent to the basilar without significant stenosis. Skeleton: no acute skeletal abnormality. Other neck: Negative Upper chest: Lung apices clear bilaterally. Review  of the MIP images confirms the above findings CTA HEAD FINDINGS Anterior circulation: Atherosclerotic calcification in the cavernous carotid bilaterally without significant stenosis. Anterior and middle cerebral arteries patent bilaterally without significant stenosis or aneurysm Posterior circulation: Both vertebral  arteries contribute to the basilar. PICA patent bilaterally. Left AICA patent. Basilar widely patent. Superior cerebellar and posterior cerebral arteries patent bilaterally. Venous sinuses: Normal venous enhancement Anatomic variants: Negative for cerebral aneurysm. Delayed phase: Not performed Review of the MIP images confirms the above findings IMPRESSION: Negative for intracranial large vessel occlusion. No significant intracranial stenosis Mild atherosclerotic disease in the carotid bifurcation and cavernous carotid bilaterally without hemodynamically significant stenosis Both vertebral arteries patent to the basilar. Right vertebral artery dominant. Electronically Signed   By: [name] M.D.   On: 10/21/2018 13:01   Ct Angio Chest Pe W And/or Wo Contrast  Result Date: 10/04/2018 CLINICAL DATA:  Shortness of breath EXAM: CT ANGIOGRAPHY CHEST WITH CONTRAST TECHNIQUE: Multidetector CT imaging of the chest was performed using the standard protocol during bolus administration of intravenous contrast. Multiplanar CT image reconstructions and MIPs were obtained to evaluate the vascular anatomy. CONTRAST:  90m OMNIPAQUE IOHEXOL 350 MG/ML SOLN COMPARISON:  CT chest dated 06/18/2018 FINDINGS: Cardiovascular: The heart size is significantly enlarged. The patient is status post TAVR. Mitral valve calcifications are noted. Coronary artery calcifications are noted. Thoracic aortic calcifications are noted. Mediastinum/Nodes: The patient appears to be status post prior thyroidectomy. There are no pathologically enlarged mediastinal or hilar lymph nodes. No enlarged axillary or supraclavicular lymph nodes. Lungs/Pleura: The lung volumes are low. There is no large pleural effusion. No pneumothorax. The trachea is unremarkable. There is presumed atelectasis at the lung bases bilaterally, left worse than right. Upper Abdomen: The liver appears enlarged. There is likely underlying hepatic steatosis. The patient is status  post prior cholecystectomy. The spleen is enlarged. Musculoskeletal: No chest wall abnormality. No acute or significant osseous findings. Review of the MIP images confirms the above findings. IMPRESSION: 1. No PE. 2. Cardiomegaly.  The patient is status post TAVR. 3. Low lung volumes. There is no pneumothorax or significant pleural effusion. 4. Probable hepatic steatosis and hepatomegaly. 5. Splenomegaly. Aortic Atherosclerosis (ICD10-I70.0). Electronically Signed   By: CConstance HolsterM.D.   On: 10/04/2018 00:16   Mr Brain Wo Contrast  Result Date: 10/22/2018 CLINICAL DATA:  73y/o F; suspect cerebellar infarctions on CT. Atrial fibrillation RVR. EXAM: MRI HEAD WITHOUT CONTRAST TECHNIQUE: Axial DWI, coronal DWI, axial T2, axial SWI, and sagittal SPACE sequences were acquired. The patient declined to continue the examination and additional sequences were not acquired. COMPARISON:  10/21/2018 CT head and CTA head. FINDINGS: Brain: Diffuse scattered subcentimeter foci of reduced diffusion and intermediate diffusion throughout the supratentorial brain, brainstem, and cerebellum including multiple lesions within the corpus callosum. A few lesions demonstrate T2 FLAIR hyperintense signal abnormality. No mass effect. No associated acute hemorrhage. Punctate foci of susceptibility hypointensity compatible with chronic microhemorrhage are present in the left frontal lobe, discrete from areas of infarction. No extra-axial collection, hydrocephalus, or herniation. Motion degraded SPACE sequence. Vascular: Normal flow voids. Skull and upper cervical spine: Normal marrow signal. Sinuses/Orbits: Negative. Other: None. IMPRESSION: Diffuse scattered subcentimeter foci of reduced diffusion and intermediate diffusion throughout the supratentorial brain, brainstem, and cerebellum including multiple lesions within the corpus callosum. Given age and history of atrial fibrillation, findings likely represent multiple embolic acute  and subacute infarctions. Demyelination is considered unlikely. No hemorrhage or mass effect. These results will be called  to the ordering clinician or representative by the Radiologist Assistant, and communication documented in the PACS or zVision Dashboard. Electronically Signed   By: Kristine Garbe M.D.   On: 10/22/2018 22:37   Dg Chest Portable 1 View  Result Date: 10/21/2018 CLINICAL DATA:  Shortness of breath EXAM: PORTABLE CHEST 1 VIEW COMPARISON:  10/03/2018 FINDINGS: Normal heart size. Transcatheter aortic valve replacement. Left PICC with tip at the upper cavoatrial junction. There is no edema, consolidation, effusion, or pneumothorax. Negative mediastinal contours. IMPRESSION: No evidence of acute disease. Electronically Signed   By: Monte Fantasia M.D.   On: 10/21/2018 04:25   Dg Chest Portable 1 View  Result Date: 10/03/2018 CLINICAL DATA:  Shortness of breath EXAM: PORTABLE CHEST 1 VIEW COMPARISON:  07/08/2018 FINDINGS: The heart is enlarged. The patient is status post prior TAVR. There is no pneumothorax. No large pleural effusion. No significant area of consolidation. There is no pneumothorax. No acute osseous abnormality. IMPRESSION: No active disease. Electronically Signed   By: Constance Holster M.D.   On: 10/03/2018 22:21   US Abdomen Ruq W/elastography  Result Date: 09/30/2018 CLINICAL DATA:  Evaluate for cirrhosis.  Increased LFTs. EXAM: US ABDOMEN LIMITED - RIGHT UPPER QUADRANT ULTRASOUND HEPATIC ELASTOGRAPHY TECHNIQUE: Limited right upper quadrant abdominal ultrasound was performed. In addition, ultrasound elastography evaluation of the liver was performed. A region of interest was placed in the right lobe of the liver. Following application of a compressive sonographic pulse, shear waves were detected in the adjacent hepatic tissue and the shear wave velocity was calculated. Multiple assessments were performed at the selected site. Median shear wave velocity is  correlated to a Metavir fibrosis score. COMPARISON:  CT AP 06/17/2018 FINDINGS: ULTRASOUND ABDOMEN LIMITED RIGHT UPPER QUADRANT Gallbladder: Status post cholecystectomy. Common bile duct: Diameter: 6 mm Liver: Diffuse coarsened echotexture noted. No mass identified. Portal vein is patent on color Doppler imaging with normal direction of blood flow towards the liver. ULTRASOUND HEPATIC ELASTOGRAPHY Device: Siemens Helix VTQ Patient position: Oblique Transducer 6C1 Number of measurements: 10 Hepatic segment:  8 Median velocity:   1.4 m/sec IQR: 0.3 IQR/Median velocity ratio: 0.21 Corresponding Metavir fibrosis score:  F2 + some F3 Risk of fibrosis: Moderate Limitations of exam: Coughing and shortness of breath Please note that abnormal shear wave velocities may also be identified in clinical settings other than with hepatic fibrosis, such as: acute hepatitis, elevated right heart and central venous pressures including use of beta blockers, veno-occlusive disease (Budd-Chiari), infiltrative processes such as mastocytosis/amyloidosis/infiltrative tumor, extrahepatic cholestasis, in the post-prandial state, and liver transplantation. Correlation with patient history, laboratory data, and clinical condition recommended. IMPRESSION: ULTRASOUND ABDOMEN: 1. Coarsened echotexture of the liver compatible with hepatic steatosis. 2. Status post cholecystectomy. ULTRASOUND HEPATIC ELASTOGRAHY: Median hepatic shear wave velocity is calculated at 1.4 m/sec. Corresponding Metavir fibrosis score is F2 + some F3. Risk of fibrosis is High. Follow-up: Additional testing appropriate Electronically Signed   By: Kerby Moors M.D.   On: 09/30/2018 11:47   [name] Ekg Site Rite  Result Date: 10/09/2018 If Site Rite image not attached, placement could not be confirmed due to current cardiac rhythm.      Subjective:  Patient seen and examined the bedside this morning.  Appeared comfortable, not in distress.  Hemodynamically stable for  discharge Discharge Exam: Vitals:   10/23/18 0425 10/23/18 1223  BP: (!) 143/76 (!) 142/67  Pulse: 81   Resp: (!) 21 18  Temp: 98 F (36.7 C) 98.1 F (36.7 C)  SpO2: 92% 97%   Vitals:   10/22/18 2325 10/23/18 0425 10/23/18 0620 10/23/18 1223  BP: (!) 128/57 (!) 143/76  (!) 142/67  Pulse: 71 81    Resp: 20 (!) 21  18  Temp: 97.7 F (36.5 C) 98 F (36.7 C)  98.1 F (36.7 C)  TempSrc: Oral Oral  Tympanic  SpO2: 90% 92%  97%  Weight:  87.6 kg 87.7 kg   Height:        General: Pt is alert, awake, not in acute distress Cardiovascular: RRR, S1/S2 +, no rubs, no gallops Respiratory: CTA bilaterally, no wheezing, no rhonchi Abdominal: Soft, NT, ND, bowel sounds + Extremities: no edema, no cyanosis    The results of significant diagnostics from this hospitalization (including imaging, microbiology, ancillary and laboratory) are listed below for reference.     Microbiology: Recent Results (from the past 240 hour(s))  SARS Coronavirus 2 (CEPHEID - Performed in [place] hospital lab), Hosp Order     Status: None   Collection Time: 10/21/18  6:04 AM   Specimen: Nasopharyngeal Swab  Result Value Ref Range Status   SARS Coronavirus 2 NEGATIVE NEGATIVE Final    Comment: (NOTE) If result is NEGATIVE SARS-CoV-2 target nucleic acids are NOT DETECTED. The SARS-CoV-2 RNA is generally detectable in upper and lower  respiratory specimens during the acute phase of infection. The lowest  concentration of SARS-CoV-2 viral copies this assay can detect is 250  copies / mL. A negative result does not preclude SARS-CoV-2 infection  and should not be used as the sole basis for treatment or other  patient management decisions.  A negative result may occur with  improper specimen collection / handling, submission of specimen other  than nasopharyngeal swab, presence of viral mutation(s) within the  areas targeted by this assay, and inadequate number of viral copies  (<250 copies / mL). A  negative result must be combined with clinical  observations, patient history, and epidemiological information. If result is POSITIVE SARS-CoV-2 target nucleic acids are DETECTED. The SARS-CoV-2 RNA is generally detectable in upper and lower  respiratory specimens dur ing the acute phase of infection.  Positive  results are indicative of active infection with SARS-CoV-2.  Clinical  correlation with patient history and other diagnostic information is  necessary to determine patient infection status.  Positive results do  not rule out bacterial infection or co-infection with other viruses. If result is PRESUMPTIVE POSTIVE SARS-CoV-2 nucleic acids MAY BE PRESENT.   A presumptive positive result was obtained on the submitted specimen  and confirmed on repeat testing.  While 2019 novel coronavirus  (SARS-CoV-2) nucleic acids may be present in the submitted sample  additional confirmatory testing may be necessary for epidemiological  and / or clinical management purposes  to differentiate between  SARS-CoV-2 and other Sarbecovirus currently known to infect humans.  If clinically indicated additional testing with an alternate test  methodology [phone]) is advised. The SARS-CoV-2 RNA is generally  detectable in upper and lower respiratory sp ecimens during the acute  phase of infection. The expected result is Negative. Fact Sheet for Patients:  StrictlyIdeas.no Fact Sheet for Healthcare Providers: BankingDealers.co.za This test is not yet approved or cleared by the [place] FDA and has been authorized for detection and/or diagnosis of SARS-CoV-2 by FDA under an Emergency Use Authorization (EUA).  This EUA will remain in effect (meaning this test can be used) for the duration of the COVID-19 declaration under Section 564(b)(1) of the Act, 21 U.S.C.  section 360bbb-3(b)(1), unless the authorization is terminated or revoked sooner. Performed  at [facility], [place] [address], [place], [place] [address]   Culture, blood (routine x 2)     Status: None (Preliminary result)   Collection Time: 10/21/18  4:50 PM   Specimen: BLOOD  Result Value Ref Range Status   Specimen Description BLOOD RIGHT ANTECUBITAL  Final   Special Requests AEROBIC BOTTLE ONLY Blood Culture adequate volume  Final   Culture   Final    NO GROWTH 2 DAYS Performed at [facility], [place] [address], [place], [place] [address]    Report Status PENDING  Incomplete  Culture, blood (routine x 2)     Status: None (Preliminary result)   Collection Time: 10/21/18  4:55 PM   Specimen: BLOOD LEFT HAND  Result Value Ref Range Status   Specimen Description BLOOD LEFT HAND  Final   Special Requests   Final    AEROBIC BOTTLE ONLY Blood Culture results may not be optimal due to an inadequate volume of blood received in culture bottles   Culture   Final    NO GROWTH 2 DAYS Performed at [facility], [place] [address], [place], [place] [address]    Report Status PENDING  Incomplete     Labs: BNP (last 3 results) Recent Labs    07/07/18 1630  BNP 578.4*   Basic Metabolic Panel: Recent Labs  Lab 10/21/18 0409 10/22/18 0445 10/23/18 1100  NA 136 140 139  K 3.5 3.3* 3.2*  CL 104 103 105  CO2 18* 24 22  GLUCOSE 188* 132* 209*  BUN _0 CREATININE 0.90 0.82 0.81  CALCIUM 10.3 9.3 9.1  MG  --   --  1.7   Liver Function Tests: No results for input(s): AST, ALT, ALKPHOS, BILITOT, PROT, ALBUMIN in the last 168 hours. No results for input(s): LIPASE, AMYLASE in the last 168 hours. No results for input(s): AMMONIA in the last 168 hours. CBC: Recent Labs  Lab 10/21/18 0409 10/22/18 0445 10/23/18 0435  WBC 8.8 6.4 5.7  HGB 11.5* 9.2* 9.0*  HCT 37.1 30.5* 29.6*  MCV 62.8* 64.1* 64.6*  PLT 238 182 166   Cardiac Enzymes: No results for input(s): CKTOTAL, CKMB, CKMBINDEX, TROPONINI in the last 168 hours. BNP: Invalid input(s):  POCBNP CBG: Recent Labs  Lab 10/22/18 1225 10/22/18 1638 10/22/18 2225 10/23/18 0621 10/23/18 1221  GLUCAP 95 178* 123* 117* 208*   D-Dimer No results for input(s): DDIMER in the last 72 hours. Hgb A1c Recent Labs    10/22/18 0445  HGBA1C 7.5*   Lipid Profile Recent Labs    10/22/18 0445  CHOL 128  HDL 24*  LDLCALC 85  TRIG 95  CHOLHDL 5.3   Thyroid function studies Recent Labs    10/21/18 1530  TSH 0.322*   Anemia work up Recent Labs    10/23/18 0808  FERRITIN 45  TIBC 335  IRON 52   Urinalysis    Component Value Date/Time   COLORURINE YELLOW 10/04/2018 0112   APPEARANCEUR CLEAR 10/04/2018 0112   LABSPEC 1.029 10/04/2018 0112   PHURINE 6.0 10/04/2018 0112   GLUCOSEU NEGATIVE 10/04/2018 0112   HGBUR NEGATIVE 10/04/2018 0112   HGBUR negative 12/13/2008 [place] 10/04/2018 0112   BILIRUBINUR neg 04/10/2018 1519   KETONESUR NEGATIVE 10/04/2018 0112   PROTEINUR NEGATIVE 10/04/2018 0112   UROBILINOGEN 0.2 04/10/2018 1519   UROBILINOGEN 0.2 09/18/2011 2049  NITRITE NEGATIVE 10/04/2018 0112   LEUKOCYTESUR NEGATIVE 10/04/2018 0112   Sepsis Labs Invalid input(s): PROCALCITONIN,  WBC,  LACTICIDVEN Microbiology Recent Results (from the past 240 hour(s))  SARS Coronavirus 2 (CEPHEID - Performed in [place] hospital lab), Hosp Order     Status: None   Collection Time: 10/21/18  6:04 AM   Specimen: Nasopharyngeal Swab  Result Value Ref Range Status   SARS Coronavirus 2 NEGATIVE NEGATIVE Final    Comment: (NOTE) If result is NEGATIVE SARS-CoV-2 target nucleic acids are NOT DETECTED. The SARS-CoV-2 RNA is generally detectable in upper and lower  respiratory specimens during the acute phase of infection. The lowest  concentration of SARS-CoV-2 viral copies this assay can detect is 250  copies / mL. A negative result does not preclude SARS-CoV-2 infection  and should not be used as the sole basis for treatment or other  patient  management decisions.  A negative result may occur with  improper specimen collection / handling, submission of specimen other  than nasopharyngeal swab, presence of viral mutation(s) within the  areas targeted by this assay, and inadequate number of viral copies  (<250 copies / mL). A negative result must be combined with clinical  observations, patient history, and epidemiological information. If result is POSITIVE SARS-CoV-2 target nucleic acids are DETECTED. The SARS-CoV-2 RNA is generally detectable in upper and lower  respiratory specimens dur ing the acute phase of infection.  Positive  results are indicative of active infection with SARS-CoV-2.  Clinical  correlation with patient history and other diagnostic information is  necessary to determine patient infection status.  Positive results do  not rule out bacterial infection or co-infection with other viruses. If result is PRESUMPTIVE POSTIVE SARS-CoV-2 nucleic acids MAY BE PRESENT.   A presumptive positive result was obtained on the submitted specimen  and confirmed on repeat testing.  While 2019 novel coronavirus  (SARS-CoV-2) nucleic acids may be present in the submitted sample  additional confirmatory testing may be necessary for epidemiological  and / or clinical management purposes  to differentiate between  SARS-CoV-2 and other Sarbecovirus currently known to infect humans.  If clinically indicated additional testing with an alternate test  methodology [phone]) is advised. The SARS-CoV-2 RNA is generally  detectable in upper and lower respiratory sp ecimens during the acute  phase of infection. The expected result is Negative. Fact Sheet for Patients:  StrictlyIdeas.no Fact Sheet for Healthcare Providers: BankingDealers.co.za This test is not yet approved or cleared by the [place] FDA and has been authorized for detection and/or diagnosis of SARS-CoV-2 by FDA under  an Emergency Use Authorization (EUA).  This EUA will remain in effect (meaning this test can be used) for the duration of the COVID-19 declaration under Section 564(b)(1) of the Act, 21 U.S.C. section 360bbb-3(b)(1), unless the authorization is terminated or revoked sooner. Performed at [facility], [place] [address] [place], [place] [address]   Culture, blood (routine x 2)     Status: None (Preliminary result)   Collection Time: 10/21/18  4:50 PM   Specimen: BLOOD  Result Value Ref Range Status   Specimen Description BLOOD RIGHT ANTECUBITAL  Final   Special Requests AEROBIC BOTTLE ONLY Blood Culture adequate volume  Final   Culture   Final    NO GROWTH 2 DAYS Performed at [facility], [place] [address], [place], [place] [address]    Report Status PENDING  Incomplete  Culture, blood (routine x 2)  Status: None (Preliminary result)   Collection Time: 10/21/18  4:55 PM   Specimen: BLOOD LEFT HAND  Result Value Ref Range Status   Specimen Description BLOOD LEFT HAND  Final   Special Requests   Final    AEROBIC BOTTLE ONLY Blood Culture results may not be optimal due to an inadequate volume of blood received in culture bottles   Culture   Final    NO GROWTH 2 DAYS Performed at [facility], [place] [address], [place], [place] [address]    Report Status PENDING  Incomplete    Please note: You were cared for by a hospitalist during your hospital stay. Once you are discharged, your primary care physician will handle any further medical issues. Please note that NO REFILLS for any discharge medications will be authorized once you are discharged, as it is imperative that you return to your primary care physician (or establish a relationship with a primary care physician if you do not have one) for your post hospital discharge needs so that they can reassess your need for medications and monitor your lab values.    Time coordinating discharge: 40  minutes  SIGNED:   Shelly Coss, MD  [facility] 10/23/2018, 2:23 PM Pager [phone]  If 7PM-7AM, please contact night-coverage [email or web address] Password [email or web address]

## 2018-10-23 NOTE — Progress Notes (Signed)
[place] for heparin Indication: atrial fibrillation, stroke  Allergies  Allergen Reactions  . Statins Other (See Comments)    Muscle aches    Patient Measurements: Height: 5' 5"  (165.1 cm) Weight: 191 lb 2.2 oz (86.7 kg) IBW/kg (Calculated) : 57 Heparin Dosing Weight: 76.8 kg  Vital Signs: Temp: 97.7 F (36.5 C) (07/01 2325) Temp Source: Oral (07/01 2325) BP: 128/57 (07/01 2325) Pulse Rate: 71 (07/01 2325)  Estimated Creatinine Clearance: 65.5 mL/min (by C-G formula based on SCr of 0.82 mg/dL).  Assessment: 73 y.o. female with Afib/CVA for heparin.  Heparin level slightly above goal range.  Goal of Therapy:  Heparin level 0.3-0.5 units/ml Monitor platelets by anticoagulation protocol: Yes   Plan:  Levels previously subtherapeutic.  Will continue heparin at current rate for now, f/u am labs.  If remains above goal will adjust rate at that time.   Phillis Knack, PharmD, BCPS  10/23/2018,1:22 AM

## 2018-10-23 NOTE — Progress Notes (Signed)
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 To discharging provider:  please sign these orders via discharge navigator,  Select New Orders & click on the button choice - Manage This Unsigned Work.     Thank you for allowing pharmacy to be a part of this patient's care.  Jimmy Footman, PharmD, BCPS, BCIDP Infectious Diseases Clinical Pharmacist Phone: [phone] 10/23/2018, 3:09 PM

## 2018-10-23 NOTE — Progress Notes (Signed)
Patient ID: Breanna Webster, female   DOB: 29-Sep-1945, 73 y.o.   MRN: [id number]          [facility] for Infectious Disease    Date of Admission:  10/21/2018   Total days of antibiotics 16         Brain MRI confirmed bilateral embolic strokes related to her known endocarditis and/for clot related to her atrial fibrillation.  She is discharging back to home later today.  We plan on continuing high-dose ceftriaxone at least through 11/18/2018.  She has a follow-up visit scheduled in our clinic that day.  Please call if we can be of further assistance.         Michel Bickers, MD [facility] for Infectious [place] [facility] [phone] pager   [phone] cell 10/23/2018, 3:25 PM

## 2018-10-23 NOTE — TOC Transition Note (Addendum)
Transition of Care [facility]) - CM/SW Discharge Note Marvetta Gibbons RN, BSN Transitions of Care Unit 4E- RN Case Manager [phone]   Patient Details  Name: Breanna Webster MRN: [id number] Date of Birth: Nov 13, 1945  Transition of Care [facility]) CM/SW Contact:  Dawayne Patricia, RN Phone Number: 10/23/2018, 2:44 PM   Clinical Narrative:    Pt re-admitted with acute bil. Cerebellar strokes, afib- stable for transition back home- with [facility] services and IV abx through 7/28. Orders have been placed for [facility] RN/PT. Pt was active with [facility] and [facility]- notified Butch Penny with [facility] for resumption of [place] services. Pt also started on Eliquis- benefits check submitted- CM spoke with pt at bedside- 30 day free card provided to use at her pharmacy- offered to call pt with copay info once benefit check returned- pt however states she will f/u with her pharmacy on cost. Pt also provided co-pay assist card as per pharmacy pt's copay is $116.     Final next level of care: [place] Barriers to Discharge: No Barriers Identified   Patient Goals and CMS Choice Patient states their goals for this hospitalization and ongoing recovery are:: "to return home" CMS Medicare.gov Compare Post Acute Care list provided to:: Patient Choice offered to / list presented to : Patient  Discharge Placement  Home with [facility]                     Discharge Plan and Services   Discharge Planning Services: CM Consult Post Acute Care Choice: Home Health, Resumption of Svcs/PTA Provider          DME Arranged: N/A DME Agency: NA       HH Arranged: RN, PT, OT, IV Antibiotics HH Agency: Tour manager, [place] ([facility]) Date [place]: 10/23/18 Time [place]: 6168 Representative spoke with at [place]: Janae Sauce  Social Determinants of Health (SDOH) Interventions     Readmission Risk Interventions Readmission Risk Prevention Plan 10/23/2018 10/09/2018  Transportation  Screening Complete Complete  PCP or Specialist Appt within 5-7 Days Complete Complete  Home Care Screening Complete Complete  Medication Review (RN CM) Complete Complete  Some recent data might be hidden

## 2018-10-23 NOTE — Progress Notes (Signed)
[place] for heparin Indication: atrial fibrillation, stroke  Allergies  Allergen Reactions  . Statins Other (See Comments)    Muscle aches    Patient Measurements: Height: 5' 5"  (165.1 cm) Weight: 193 lb 5.5 oz (87.7 kg)(BED WEIGHT) IBW/kg (Calculated) : 57 Heparin Dosing Weight: 76.8 kg  Vital Signs: Temp: 98 F (36.7 C) (07/02 0425) Temp Source: Oral (07/02 0425) BP: 143/76 (07/02 0425) Pulse Rate: 81 (07/02 0425)  Estimated Creatinine Clearance: 65.8 mL/min (by C-G formula based on SCr of 0.82 mg/dL).  Assessment: 73 y.o. female with Afib/CVA for heparin. No AC PTA.  Heparin level came back supratherapeutic at 0.68, on 1250 units/hr. Hgb 9, plt 166. No s/sx of bleeding. No infusion issues.   Goal of Therapy:  Heparin level 0.3-0.5 units/ml Monitor platelets by anticoagulation protocol: Yes   Plan:  Reduce heparin level to 1100 units/hr Order heparin level in 6 hr Monitor daily HL, CBC, and for s/sx of bleeding  Antonietta Jewel, PharmD, [place] Clinical Pharmacist  Pager: [phone] Phone: [phone] 10/23/2018,9:41 AM

## 2018-10-23 NOTE — Consult Note (Signed)
   [facility] CM Inpatient Consult   10/23/2018  Breanna Webster 22-Mar-1946 [id number]    Alerted from [place] CM of patient's readmission. Patient is currently active with [facility] for follow-up EMMI calls (General) post recent hospitalization, and offered follow-up after hospital discharge.   Patient reviewed for readmission within 30 days; has 3 hospitalizations and 1 ED visit in the past 6 months, under her Medicare/[employer] A[employer] plan.  Chart review reveals as follows: 73 y.o. female with a hx of CAD s/p mRCA (12/19), LBBB, GERD, Grave's disease, HTN, Hypothyroidism, IBS, DM AS s/p TAVR now with acute bilateral cerebellar stroke and a fib with RVR along with bacteremia for mitral valve vegetation. On 2/6 weeks ABX.(Embolic bilateral cerebellar infarcts due to new A fib)  Transition of care CSW note states, acknowledges SNF (skilled nursing facility) consult; PT and OT are recommending home health. Plan:  Patient will be followed for disposition and needs. Will make [facility] RN CM aware of it.   Of note, [facility] Care Management services does not replace or interfere with any services that are needed or arranged by inpatient case management or social work.    For additional questions or referrals please contact:  Edwena Felty A. Jveon Pound, BSN, RN-BC [facility] Liaison Cell: [phone]

## 2018-10-24 DIAGNOSIS — B955 Unspecified streptococcus as the cause of diseases classified elsewhere: Secondary | ICD-10-CM | POA: Diagnosis not present

## 2018-10-24 DIAGNOSIS — I330 Acute and subacute infective endocarditis: Secondary | ICD-10-CM | POA: Diagnosis not present

## 2018-10-24 DIAGNOSIS — E1165 Type 2 diabetes mellitus with hyperglycemia: Secondary | ICD-10-CM | POA: Diagnosis not present

## 2018-10-24 DIAGNOSIS — I2510 Atherosclerotic heart disease of native coronary artery without angina pectoris: Secondary | ICD-10-CM | POA: Diagnosis not present

## 2018-10-24 DIAGNOSIS — Z452 Encounter for adjustment and management of vascular access device: Secondary | ICD-10-CM | POA: Diagnosis not present

## 2018-10-24 DIAGNOSIS — R7881 Bacteremia: Secondary | ICD-10-CM | POA: Diagnosis not present

## 2018-10-25 ENCOUNTER — Other Ambulatory Visit: Payer: Medicare Other

## 2018-10-26 LAB — CULTURE, BLOOD (ROUTINE X 2)
Culture: NO GROWTH
Culture: NO GROWTH
Special Requests: ADEQUATE

## 2018-10-27 ENCOUNTER — Other Ambulatory Visit: Payer: Self-pay

## 2018-10-27 ENCOUNTER — Other Ambulatory Visit: Payer: Medicare Other

## 2018-10-27 ENCOUNTER — Telehealth: Payer: Self-pay | Admitting: *Deleted

## 2018-10-27 DIAGNOSIS — I330 Acute and subacute infective endocarditis: Secondary | ICD-10-CM | POA: Diagnosis not present

## 2018-10-27 DIAGNOSIS — R7881 Bacteremia: Secondary | ICD-10-CM | POA: Diagnosis not present

## 2018-10-27 DIAGNOSIS — I2510 Atherosclerotic heart disease of native coronary artery without angina pectoris: Secondary | ICD-10-CM | POA: Diagnosis not present

## 2018-10-27 DIAGNOSIS — E1165 Type 2 diabetes mellitus with hyperglycemia: Secondary | ICD-10-CM | POA: Diagnosis not present

## 2018-10-27 DIAGNOSIS — Z452 Encounter for adjustment and management of vascular access device: Secondary | ICD-10-CM | POA: Diagnosis not present

## 2018-10-27 DIAGNOSIS — B955 Unspecified streptococcus as the cause of diseases classified elsewhere: Secondary | ICD-10-CM | POA: Diagnosis not present

## 2018-10-27 NOTE — Telephone Encounter (Signed)
[employer] to ship a 30 day cardiac event monitor to their home.  Instructions reviewed briefly as they are included in the monitor kit.  Patient, husband, or home health nurse can call [employer] at [phone] or Darrick Penna at [phone] if assistance needed.

## 2018-10-28 ENCOUNTER — Other Ambulatory Visit: Payer: Self-pay | Admitting: *Deleted

## 2018-10-28 ENCOUNTER — Encounter: Payer: Self-pay | Admitting: *Deleted

## 2018-10-28 NOTE — Patient Outreach (Signed)
[facility]) Care Management  10/28/2018  Breanna Webster 12/06/1945 [id number]    Referral received 10/28/2018 Initial outreach 10/28/2018 EMMI RED (new referral) Post hospital discharge with primary provider to completed Transition of care.  RN initial spoke with spouse who requested a call back in 10 mins after [facility] introductions.  RN returned the call more convenient for the primary caregiver [name]). RN reintroduced the [facility] program once again. Verified identifiers with DRP and explained the purpose for today's call. Spouse very receptive and explained pt's history with the involved [place] agency for pt's ongoing PICC maintenance along with administering pt's ongoing medications through the PICC with no additional assistance. Caregiver states he is performing the entire administration of all medications requested to the PICC. States he is more comfortable now after doing this task so long and did received education on "hanging all the medications" but surprise the agency was not as involved with performing this task. RN inquired if he needed additional tutoring on this task however pt refused and indicated he "is comfortable now".   RN inquired on EMMI reports from 7/4 & 7/6 as caregiver indicates he has spoke with rehab and post-poned pt's therapy until she is more stable and able to participate with the requested therapy.  All EMMI related inquires are clear with no needs related. RN explained the EMMI calls and how RN Case management would need to follow up monthly for program related enrollment (receptive).   RN offered to review all medications however caregiver indicated he is very aware and has organized the pt's medications for AM/AFTERNOON/PM pill box for all pt's medications. Caregiver decline review. RN will attempt this task once again when completed the initial assessment on this pt. Caregiver inquired on possible assistance with pt's Eliquis. RN offered pharmacy  referral for possible available programs that maybe able to reduce the cost if pt qualifies for available program.  Based upon today's discussion RN offered [facility] services over the next few months for prevention measures on readmission and much discussion on pt's adherence with medications and attendance with all medical appointments especially since pt's recent discharge. Spouse receptive as Therapist, sports generated a plan of care related to pt's recent hospital involving a stroke. RN educated pt on the Excel.S.T related to stork symptoms and what to do if acute symptoms are presented.Will follow up in two weeks and completed the initial assessment, notify the involved provider and send EMMI and [facility] packet for pt's enrollment. Will also notify the provider on pt's enrollment into the [facility] program and services. No other needs at this time.  THN CM Care Plan Problem One     Most Recent Value  Care Plan Problem One  Recent hospitalization  Role Documenting the Problem One  Care Management [place] for Problem One  Active  THN Long Term Goal   Pt will not have readmission over the next 90 days related to stroke.  THN Long Term Goal Start Date  10/28/18  Interventions for Problem One Long Term Goal  Will education on prevention measures to avoid readmission and mail [facility] packet on recent medical issues related to this admission (Stroke).  THN CM Short Term Goal #1   Adherence with medical appointments post hospital d/c over the next 30 days.  THN CM Short Term Goal #1 Start Date  10/28/18  Interventions for Short Term Goal #1  Will discuss pending medical appointments and verified post-hospital appointment with primary provider over the next 2 weeks.  THN CM Short Term Goal #2   Adherence with medication post hospital d/c over the next 30 days.  THN CM Short Term Goal #2 Start Date  10/28/18  Interventions for Short Term Goal #2  Will offer to review post d/c medications and make a referral for the  requested assistance with Eliquis for this pt. Will strongly encouraged spouse to be receptive to the assistance that maybe offered. Will follow up next 2 weeks on any intervention that maybe able to assist.      [name], RN Care Management Coordinator [place] Office [phone]

## 2018-10-29 ENCOUNTER — Telehealth: Payer: Self-pay | Admitting: Pharmacist

## 2018-10-29 ENCOUNTER — Ambulatory Visit: Payer: PRIVATE HEALTH INSURANCE

## 2018-10-29 ENCOUNTER — Ambulatory Visit: Payer: Self-pay

## 2018-10-29 NOTE — Patient Outreach (Signed)
[facility]) Care Management  [place]   10/29/2018  Breanna Webster 1946/03/12 [id number]  Reason for referral: Medication Assistance  Referral source: [facility] Social Worker Current insurance: Medicare/Aetna ([employer])  PMHx includes but not limited to:   Anxiety, Atrial Fibrillation, CAD,Carotid artery stenosis, type 2 diabetes, dyslipidemia, hypertension, GERD, hypothyroidism, Obesity, aortic stenosis, and vitamin D deficiency.  Patient recently visited the ED for SOB. She was found to be in A fib and was subsequently hospitalized.  Outreach:  Successful telephone call with patient and husband.  HIPAA identifiers verified.   Subjective:  Patient reports feeling better. Since she was hospitalized, she has not been able to manage her medications. Her husband is managing her medication for her.  He reported going to pick up Eliquis and having to pay a copay >$130 for a 30 day supply.   Does the patient ever forget to take medication?  yes Does the patient have problems obtaining medications due to transportation?   no Does the patient have problems obtaining medications due to cost?  yes  Does the patient feel that medications prescribed are effective?  yes Does the patient ever experience any side effects to the medications prescribed?  no  Does the patient measure his/her own blood glucose at home?  No Does the patient measure his/her own blood pressure at home? No   Objective: The ASCVD Risk score Mikey Bussing DC Jr., et al., 2013) failed to calculate for the following reasons:   The patient has a prior MI or stroke diagnosis  Lab Results  Component Value Date   CREATININE 0.81 10/23/2018   CREATININE 0.82 10/22/2018   CREATININE 0.90 10/21/2018    Lab Results  Component Value Date   HGBA1C 7.5 (H) 10/22/2018    Lipid Panel     Component Value Date/Time   CHOL 128 10/22/2018 0445   CHOL 156 11/21/2016 1113   TRIG 95 10/22/2018 0445   HDL  24 (L) 10/22/2018 0445   HDL 44 11/21/2016 1113   CHOLHDL 5.3 10/22/2018 0445   VLDL 19 10/22/2018 0445   LDLCALC 85 10/22/2018 0445   LDLCALC 91 11/21/2016 1113   LDLDIRECT 98.0 05/24/2014 1343    BP Readings from Last 3 Encounters:  10/23/18 (!) 142/67  10/17/18 140/71  10/10/18 (!) 178/87    Allergies  Allergen Reactions  . Statins Other (See Comments)    Muscle aches    Medications Reviewed Today    Reviewed by Elayne Guerin, [facility] (Pharmacist) on 10/29/18 at 1031  Med List Status: <None>  Medication Order Taking? Sig Documenting Provider Last Dose Status Informant  acetaminophen (TYLENOL) 500 MG tablet [id number] Yes Take 1,000 mg by mouth every 6 (six) hours as needed (for pain.). [provider] Taking Active Self  apixaban (ELIQUIS) 5 MG TABS tablet [id number] Yes Take 1 tablet (5 mg total) by mouth 2 (two) times daily. Shelly Coss, MD Taking Active   aspirin 81 MG tablet [id number] Yes Take 81 mg by mouth daily.   [provider] Taking Active Self  cefTRIAXone (ROCEPHIN) IVPB [id number] Yes Inject 2 g into the vein every 12 (twelve) hours for 26 days. Indication:  Strep bacteremia with endocarditis Last Day of Therapy:  11/18/18 Labs - Once weekly:  CBC/D and BMP, Labs - Every other week:  ESR and CRP Adhikari, Amrit, MD Taking Active   chlorpheniramine (CHLOR-TRIMETON) 4 MG tablet [id number] Yes Take 4 mg by mouth at bedtime.  [provider] Taking  Active Self  cholecalciferol (VITAMIN D) 1000 units tablet [id number] Yes Take 1,000 Units by mouth daily. [provider] Taking Active Self  diltiazem (CARDIZEM CD) 120 MG 24 hr capsule [id number] Yes Take 1 capsule (120 mg total) by mouth daily. Shelly Coss, MD Taking Active   escitalopram (LEXAPRO) 20 MG tablet [id number] Yes Take 1 tablet (20 mg total) by mouth daily. Nafziger, Tommi Rumps, NP Taking Active   ferrous sulfate 325 (65 FE) MG tablet [id number] No Take 1 tablet (325 mg total) by  mouth 2 (two) times daily with a meal.  Patient not taking: Reported on 10/29/2018   Kayleen Memos, DO Not Taking Active Self  hydrochlorothiazide (HYDRODIURIL) 12.5 MG tablet [id number] Yes Take 1 tablet (12.5 mg total) by mouth daily. Nafziger, Tommi Rumps, NP Taking Active   insulin NPH Human (NOVOLIN N RELION) 100 UNIT/ML injection [id number] Yes Inject 0.4 mLs (40 Units total) into the skin every morning. And syringes 1/day Kayleen Memos, DO Taking Active Self           Med Note Arnette Schaumann Oct 29, 2018 10:25 AM) Patient reported inject 100-120 units daily  Insulin Syringe-Needle U-100 (BD INSULIN SYRINGE U/F) 31G X 5/16" 1 ML MISC [id number] Yes USE TO INJECT INSULIN ONCE DAILY Renato Shin, MD Taking Active Self  levothyroxine (SYNTHROID) 75 MCG tablet [id number] Yes Take 1 tablet (75 mcg total) by mouth daily. Shelly Coss, MD Taking Active   losartan (COZAAR) 50 MG tablet [id number] Yes TAKE 1 TABLET(50 MG) BY MOUTH DAILY  Patient taking differently: Take 50 mg by mouth daily.    [name], NP Taking Active Self  Multiple Vitamin (MULITIVITAMIN WITH MINERALS) TABS [id number] Yes Take 1 tablet by mouth daily. [provider] Taking Active Self  nitroGLYCERIN (NITROSTAT) 0.4 MG SL tablet [id number] Yes Place 1 tablet (0.4 mg total) under the tongue every 5 (five) minutes as needed for chest pain. Theora Gianotti, NP Taking Active Self  nortriptyline (PAMELOR) 25 MG capsule [id number] Yes TAKE 1 CAPSULE(25 MG) BY MOUTH AT BEDTIME  Patient taking differently: Take 25 mg by mouth at bedtime.    [name], NP Taking Active Self  Jonetta Speak LANCETS 09T [place] [id number] Yes USE TO CHECK BLOOD SUGAR TWICE DAILY  Patient taking differently: 2 (two) times a day.    Renato Shin, MD Taking Active Self  [facility] VERIO test strip [id number] Yes USE TO CHECK BLOOD SUGAR TWICE DAILY  Patient taking differently: 1 each by Other route 2 (two) times a day.    Renato Shin,  MD Taking Active Self  pantoprazole (PROTONIX) 40 MG tablet [id number] Yes Take 1 tablet (40 mg total) by mouth 2 (two) times daily. Yetta Flock, MD Taking Active Self          ASSESSMENT: Date Discharged from Hospital: 10/23/2018 Date Medication Reconciliation Performed: 10/29/2018  Medications:  New at Discharge: START taking: apixaban (ELIQUIS) diltiazem (CARDIZEM CD)   Adjustments at Discharge: cefTRIAXone IVPB (ROCEPHIN) levothyroxine (Synthroid)   Discontinued at Discharge: bisoprolol 5 MG tablet (ZEBETA) clopidogrel 75 MG tablet (PLAVIX) gentamicin IVPB (GARAMYCIN) sodium chloride 0.9 %  Patient was recently discharged from hospital and all medications have been reviewed.   Drugs sorted by system:  Neurologic/Psychologic: Escitalopram, Nortriptyline,   Cardiovascular: Eliquis, Aspirin, Diltiazem, Hydrochlorothiazide, Losartan,  Pulmonary/Allergy: Chlorpheniramine,   Gastrointestinal: Pantoprazole,   Endocrine: Novolin Relion N, Levothyroxine  Pain: Acetaminophen,   Vitamins/Minerals: Cholecalciferol, Multiple Vitamin,   Infectious Diseases: Ceftriaxone  Medication  Review Findings:  . Statin allergy-has diabetes and is not on a statin . HgA1c-7.5% ([facility]-10/2018) . LDL 91 (2018)   Medication Assistance Findings:  Medication assistance needs identified: Eliquis and Novolin N  Called patient's insurance. She is responsible for 25% of the cost of all medications regardless of tier. Her price does not change when she hits the donut hole.  YTD TROOP $515.79  [employer] program requires patients to spend at least 3% (around $1500) of their house hold income on medication related expenses.  Patient has not spent that alone but she and her husband were instructed to get a print out from their local pharmacy for him as [place] will combine the medication expenses for the entire household.  Patient's husband  wondered if he could write  a hardship letter. He was encouraged to send the letter back with the application we are going to send to him.   Patient should qualify to receive Novolin N from [employer]. She is currently purchasing insulin from [place] via cash to help save money.  If deemed therapeutically appropriate, her provider could change the patient to Novolin N as it is available from [employer] program at no cost.  Extra Help:  Not eligible for Extra Help Low Income Subsidy based on reported income and assets   Additional medication assistance options reviewed with patient as warranted:  No other options identified  Plan: . I will route patient assistance letter to [place] technician who will coordinate patient assistance program application process for medications listed above.  [facility] pharmacy technician will assist with obtaining all required documents from both patient and provider(s) and submit application(s) once completed.  . Will route note to PCP.  Marland Kitchen Will follow-up in 3-4 weeks.    Elayne Guerin, PharmD, [place] Clinical Pharmacist [phone]

## 2018-10-31 ENCOUNTER — Encounter: Payer: Self-pay | Admitting: Interventional Cardiology

## 2018-10-31 ENCOUNTER — Ambulatory Visit (INDEPENDENT_AMBULATORY_CARE_PROVIDER_SITE_OTHER): Payer: Medicare Other

## 2018-10-31 DIAGNOSIS — I639 Cerebral infarction, unspecified: Secondary | ICD-10-CM | POA: Diagnosis not present

## 2018-10-31 DIAGNOSIS — I480 Paroxysmal atrial fibrillation: Secondary | ICD-10-CM | POA: Diagnosis not present

## 2018-11-03 ENCOUNTER — Other Ambulatory Visit: Payer: Self-pay | Admitting: *Deleted

## 2018-11-03 ENCOUNTER — Encounter: Payer: Self-pay | Admitting: Adult Health

## 2018-11-03 DIAGNOSIS — Z452 Encounter for adjustment and management of vascular access device: Secondary | ICD-10-CM | POA: Diagnosis not present

## 2018-11-03 DIAGNOSIS — I330 Acute and subacute infective endocarditis: Secondary | ICD-10-CM | POA: Diagnosis not present

## 2018-11-03 DIAGNOSIS — I2510 Atherosclerotic heart disease of native coronary artery without angina pectoris: Secondary | ICD-10-CM | POA: Diagnosis not present

## 2018-11-03 DIAGNOSIS — B955 Unspecified streptococcus as the cause of diseases classified elsewhere: Secondary | ICD-10-CM | POA: Diagnosis not present

## 2018-11-03 DIAGNOSIS — R7881 Bacteremia: Secondary | ICD-10-CM | POA: Diagnosis not present

## 2018-11-03 DIAGNOSIS — E1165 Type 2 diabetes mellitus with hyperglycemia: Secondary | ICD-10-CM | POA: Diagnosis not present

## 2018-11-03 NOTE — Patient Outreach (Signed)
[facility]) Care Management  11/03/2018  ANAI LIPSON 08-Jul-1945 [id number]    Received f/u for EMMI RED 11/02/2018  RN spoke with pt's spouse who indicated he medicated pt and she was currently sleeping. RN inquired on the purpose for today's call as spouse explained and he believes she feels that way due to her been very active prior to the stroke and now limited with ongoing forgetfulness and some confusion at times. States the pt understandings what has occurred concerning the stroke and they have had a conversation on preventing the risk of a second stroke. RN offered counseling and/or if needed a request for a consultation for a psychologist if needed. Spouse declined at this time. RN strongly encourage caregiver to consult with pt's provider if pt continues to exhibits any symptoms of depression or there is a change in her behavior. RN also informed caregiver that [facility] has social workers who offers counseling if pt is interested to a referral. Spouse appreciative but again does feel this services is necessary at this time. No other issues were presented at this time as RN continues to offer a follow up call as scheduled for 7/24 to further discuss pt's plan of care related to her recent enrollment into [facility] program and services.   RN will follow up later this month and continue to assess on the initial assessment and update the plan of care related to preventive measures based upon pt's recent discharge from the hospital.  [name], RN Care Management Coordinator [place] Office [phone]

## 2018-11-04 ENCOUNTER — Other Ambulatory Visit: Payer: Self-pay | Admitting: Pharmacy Technician

## 2018-11-04 NOTE — Patient Outreach (Signed)
[facility]) Care Management  11/04/2018  Breanna Webster May 07, 1945 [id number]                           Medication Assistance Referral  Referral From: [place]  Medication/Company: [employer] / Eliquis Patient application portion:  Mailed Provider application portion: Faxed  to [name], NP  Medication/Company: Karleen [place] / [employer] Patient application portion: Education officer, museum portion: Faxed  to Humberto Seals, NP    Follow up:  Will follow up with patient in 5-10 business days to confirm application(s) have been received.  Meisha P. Shanon Seawright, [place] Management [phone]

## 2018-11-05 ENCOUNTER — Telehealth: Payer: Self-pay | Admitting: Endocrinology

## 2018-11-05 ENCOUNTER — Telehealth: Payer: Self-pay

## 2018-11-05 ENCOUNTER — Other Ambulatory Visit: Payer: Self-pay

## 2018-11-05 MED ORDER — ONETOUCH VERIO VI STRP: 1.0000 | ORAL_STRIP | Freq: Two times a day (BID) | 0 refills | Status: DC

## 2018-11-05 NOTE — Telephone Encounter (Signed)
[place] ([employer]) [phone] Need help? Call us at [phone] Status Sent to [employer] The plan will fax you a determination, typically within 1 to 5 business days.  How do I follow up? Drug OneTouch Verio strips Form [employer] (GHI, HIP) Non-FDA Approved Drug Use and/or Dose Request Form Pharmacy and Therapeutics Committee form for Non-FDA Approved Drug Use and/or Dose Requests [phone] [phone] [address]

## 2018-11-05 NOTE — Telephone Encounter (Signed)
Pt returned call. Informed about denial. Advised she call her insurance company to determine what device they are now covering. Will send Rx's for whatever device/supplies are covered by her plan. Verbalized acceptance and understanding.

## 2018-11-05 NOTE — Telephone Encounter (Signed)
Pt returned call. States she spoke with her insurance company and was informed this is a covered benefit. Asked that the pharmacy fax a CMN to our office for Dr. Loanne Drilling to complete. Spoke with Pharmacist at [facility] [phone]). States she will fax CMN tomorrow. Will await documents and provide to Dr. Loanne Drilling for completion ONCE received.

## 2018-11-05 NOTE — Telephone Encounter (Signed)
MEDICATION: ONETOUCH VERIO test strip  PHARMACY:  [facility] [phone]  IS THIS A 90 DAY SUPPLY :   IS PATIENT OUT OF MEDICATION:   IF NOT; HOW MUCH IS LEFT:   LAST APPOINTMENT DATE: @6 /12/2018  NEXT APPOINTMENT DATE:@Visit  date not found  DO WE HAVE YOUR PERMISSION TO LEAVE A DETAILED MESSAGE:  OTHER COMMENTS:    **Let patient know to contact pharmacy at the end of the day to make sure medication is ready. **  ** Please notify patient to allow 48-72 hours to process**  **Encourage patient to contact the pharmacy for refills or they can request refills through [facility]**

## 2018-11-05 NOTE — Telephone Encounter (Signed)
Received notification via fax from [facility] that the PA completed for Onetouch Verio test strips has been denied under part D Medicare coverage d/t not being covered under pt plan. Called pt to make her aware of denial. Call was disconnected.  Will need to inform pt about denial and ask that she inquire further with her insurance company re: what is covered.

## 2018-11-05 NOTE — Telephone Encounter (Signed)
glucose blood (ONETOUCH VERIO) test strip 100 each 0 11/05/2018    Sig - Route: 1 each by Other route 2 (two) times a day. Use to monitor glucose levels 2 times per day; E08.65 - Other   Sent to pharmacy as: glucose blood (ONETOUCH VERIO) test strip   E-Prescribing Status: Receipt confirmed by pharmacy (11/05/2018 9:56 AM EDT)

## 2018-11-06 ENCOUNTER — Telehealth: Payer: Self-pay

## 2018-11-06 NOTE — Telephone Encounter (Signed)
I received a Critical Event monitor transmission (day2 of 30) indicating 3 beats of V-tach/Atrial Flutter.  I spoke to the patient who said that she may have been sleeping at the time 5:30am EST on 7/11.    I reviewed with Dr Marlou Porch and he noted Afib with aberrancy, not VT.  She has a f/u on 7/22 with Lori.  I will put it in her box.

## 2018-11-06 NOTE — Telephone Encounter (Signed)
CMN for Onetouch Verio Test Strips completed, signed and faxed to [facility] with confirmation received.

## 2018-11-07 ENCOUNTER — Other Ambulatory Visit: Payer: Self-pay | Admitting: *Deleted

## 2018-11-07 NOTE — Patient Outreach (Signed)
[facility]) Care Management  11/07/2018  Breanna Webster 29-Mar-1946 [id number]    Telephone Assessment/EMMI RED (resolved)  RN spoke with pt's spouse [name]) who indicated the answer NO to the EMMI was due to no scheduled appointments at that time has been missed. RN explained the question and YES the pt has attended all medical appointments with no delays or cancellations. RN explained that RN case manage would call with no EMMI RED flags concerning the pt's ongoing daily care. Spouse verbalized an understanding. Review all goals and updated accordingly along with completed other initial assessments.   Will follow up in a few weeks with this plan of care and inquired further on pt's ongoing management of care.   THN CM Care Plan Problem One     Most Recent Value  Care Plan Problem One  Recent hospitalization  Role Documenting the Problem One  Care Management [place] for Problem One  Active  THN Long Term Goal   Pt will not have readmission over the next 90 days related to stroke.  THN Long Term Goal Start Date  10/28/18  Interventions for Problem One Long Term Goal  Will continue to offer tools and information to prevent readmission and discuss prevention measures related to safety and adherence with managing her care.  THN CM Short Term Goal #1   Adherence with medical appointments post hospital d/c over the next 30 days.  THN CM Short Term Goal #1 Start Date  10/28/18  Interventions for Short Term Goal #1  Will extend due to upcoming appointments. Will strongly encouraged to attend all medical appointments  [facility] CM Short Term Goal #2   Adherence with medication post hospital d/c over the next 30 days.  THN CM Short Term Goal #2 Start Date  10/28/18  Interventions for Short Term Goal #2  Will extend to allow ongoing adherence and follow up next month.      [name], RN Care Management Coordinator [place] Office  [phone]

## 2018-11-10 ENCOUNTER — Encounter: Payer: Self-pay | Admitting: Adult Health

## 2018-11-10 DIAGNOSIS — R7881 Bacteremia: Secondary | ICD-10-CM | POA: Diagnosis not present

## 2018-11-10 DIAGNOSIS — I10 Essential (primary) hypertension: Secondary | ICD-10-CM | POA: Diagnosis not present

## 2018-11-10 DIAGNOSIS — E1165 Type 2 diabetes mellitus with hyperglycemia: Secondary | ICD-10-CM | POA: Diagnosis not present

## 2018-11-10 DIAGNOSIS — Z7982 Long term (current) use of aspirin: Secondary | ICD-10-CM | POA: Diagnosis not present

## 2018-11-10 DIAGNOSIS — Z8673 Personal history of transient ischemic attack (TIA), and cerebral infarction without residual deficits: Secondary | ICD-10-CM | POA: Diagnosis not present

## 2018-11-10 DIAGNOSIS — B954 Other streptococcus as the cause of diseases classified elsewhere: Secondary | ICD-10-CM | POA: Diagnosis not present

## 2018-11-10 DIAGNOSIS — I2510 Atherosclerotic heart disease of native coronary artery without angina pectoris: Secondary | ICD-10-CM | POA: Diagnosis not present

## 2018-11-10 DIAGNOSIS — Z952 Presence of prosthetic heart valve: Secondary | ICD-10-CM | POA: Diagnosis not present

## 2018-11-10 DIAGNOSIS — D509 Iron deficiency anemia, unspecified: Secondary | ICD-10-CM | POA: Diagnosis not present

## 2018-11-10 DIAGNOSIS — Z452 Encounter for adjustment and management of vascular access device: Secondary | ICD-10-CM | POA: Diagnosis not present

## 2018-11-10 DIAGNOSIS — Z792 Long term (current) use of antibiotics: Secondary | ICD-10-CM | POA: Diagnosis not present

## 2018-11-10 DIAGNOSIS — Z7901 Long term (current) use of anticoagulants: Secondary | ICD-10-CM | POA: Diagnosis not present

## 2018-11-10 DIAGNOSIS — Z5181 Encounter for therapeutic drug level monitoring: Secondary | ICD-10-CM | POA: Diagnosis not present

## 2018-11-10 DIAGNOSIS — B955 Unspecified streptococcus as the cause of diseases classified elsewhere: Secondary | ICD-10-CM | POA: Diagnosis not present

## 2018-11-10 DIAGNOSIS — I330 Acute and subacute infective endocarditis: Secondary | ICD-10-CM | POA: Diagnosis not present

## 2018-11-11 ENCOUNTER — Telehealth: Payer: Self-pay | Admitting: Nurse Practitioner

## 2018-11-11 NOTE — Telephone Encounter (Signed)
New Message         COVID-19 Pre-Screening Questions:   In the past 7 to 10 days have you had a cough,  shortness of breath, headache, congestion, fever (100 or greater) body aches, chills, sore throat, or sudden loss of taste or sense of smell? NO  Have you been around anyone with known Covid 19. NO  Have you been around anyone who is awaiting Covid 19 test results in the past 7 to 10 days? NO  Have you been around anyone who has been exposed to Covid 19, or has mentioned symptoms of Covid 19 within the past 7 to 10 days? NO Pts husband will be assisting wife because of her stroke, he answered NO to all  Questions   If you have any concerns/questions about symptoms patients report during screening (either on the phone or at threshold). Contact the provider seeing the patient or DOD for further guidance.  If neither are available contact a member of the leadership team.

## 2018-11-11 NOTE — Progress Notes (Signed)
CARDIOLOGY OFFICE NOTE  Date:  11/12/2018    [name] Date of Birth: March 15, 1946 Medical Record #[id number]  PCP:  [name], NP  Cardiologist:  Tamala Julian  Chief Complaint  Patient presents with  . Follow-up    Post hospital visit - seen for Dr. Tamala Julian    History of Present Illness: Breanna Webster is a 73 y.o. female who presents today for a post hospital visit. Seen for Dr. Tamala Julian.   She has a history of diabetes mellitus, Graves disease, aortic stenosis status post TAVR in 3/20, hypothyroidism, hypertension, pulmonary hypertension and coronary disease with PCI/DES x1 to the mRCA back in 12/19.   Presented to the hospital at the end of June with shortness of breath. Was found to be in A. fib with RVR on presentation. She was delirious and CT showed acute cerebellar infarcts. Interestingly, patient did not have any focal neurological deficits.  She was started on oral Cardizem and Eliquis for anticoagulation.  Also noted to have bacteremia for mitral valve vegetation and was already on 2/6 weeks ABX.  Plavix was stopped during that admission.   The patient does not have symptoms concerning for COVID-19 infection (fever, chills, cough, or new shortness of breath).   Comes in today. Here with her husband. She had postponed this visit with me due to recent events/general poor condition. She has event monitor in place - she has had some AF noted - this is still in process. She is getting her IV antibiotics thru this weekend.   She is having more issues with her vision - waiting to hear from Dr. Katy Fitch. Does not seem to understand what happened to her with the recent admission. She is asking for sleep medicine and pain medicine. She is sleeping all day and staying up all night. She is not active at all. She remains fatigued. She has shortness of breath - worse with activity Does not understand why no one will give her anything. No chest pain. Husband is frustrated. She is  frustrated - they are both arguing with one another. Cost of Eliquis is probably going to be an issue.   Past Medical History:  Diagnosis Date  . Arthritis    "back" (04/22/2018)  . CAD (coronary artery disease)    a. 03/2018 s/p PCI/DES to the RCA (3.0x15 Onyx DES).  . Carotid artery stenosis    Mild  . Chronic lower back pain   . Colon polyps   . Diverticulitis   . Diverticulosis   . Esophageal thickening    seen on pre TAVR CT scan, also questionable cirrhosis. MRI recommended. Will refer to GI  . GERD (gastroesophageal reflux disease)   . Grave's disease   . History of colonic polyps 05/22/2017  . History of hiatal hernia   . Hypertension   . Hypothyroidism   . IBS (irritable bowel syndrome)   . Osteopenia   . Pulmonary nodules    seen on pre TAVR CT. likley benign. no follow up recommended if pt low risk.  . S/P TAVR (transcatheter aortic valve replacement)   . Severe aortic stenosis   . Thalassemia minor   . Type II diabetes mellitus ([place])     Past Surgical History:  Procedure Laterality Date  . [place] STUDY N/A 03/03/2018   Procedure: [place] STUDY;  Surgeon: Mauri Pole, MD;  Location: [facility];  Service: Endoscopy;  Laterality: N/A;  . COLONOSCOPY W/ BIOPSIES AND POLYPECTOMY    .  CORONARY ANGIOGRAPHY Right 04/21/2018   Procedure: CORONARY ANGIOGRAPHY (CATH LAB);  Surgeon: [name], MD;  Location: [place] CV LAB;  Service: Cardiovascular;  Laterality: Right;  . CORONARY STENT INTERVENTION N/A 04/22/2018   Procedure: CORONARY STENT INTERVENTION;  Surgeon: [name], MD;  Location: [place] CV LAB;  Service: Cardiovascular;  Laterality: N/A;  . DILATION AND CURETTAGE OF UTERUS    . ESOPHAGEAL MANOMETRY N/A 03/03/2018   Procedure: ESOPHAGEAL MANOMETRY (EM);  Surgeon: Mauri Pole, MD;  Location: [facility];  Service: Endoscopy;  Laterality: N/A;  . HERNIA REPAIR    . HYSTEROSCOPY     fibroids  . LAPAROSCOPIC  CHOLECYSTECTOMY    . LAPAROSCOPY     fibroids  . NISSEN FUNDOPLICATION  6578I  . RIGHT/LEFT HEART CATH AND CORONARY ANGIOGRAPHY N/A 02/20/2018   Procedure: RIGHT/LEFT HEART CATH AND CORONARY ANGIOGRAPHY;  Surgeon: [name], MD;  Location: [place] CV LAB;  Service: Cardiovascular;  Laterality: N/A;  . TEE WITHOUT CARDIOVERSION N/A 07/08/2018   Procedure: TRANSESOPHAGEAL ECHOCARDIOGRAM (TEE);  Surgeon: Burnell Blanks, MD;  Location: [place];  Service: Open Heart Surgery;  Laterality: N/A;  . TEE WITHOUT CARDIOVERSION  10/07/2018  . TEE WITHOUT CARDIOVERSION N/A 10/07/2018   Procedure: TRANSESOPHAGEAL ECHOCARDIOGRAM (TEE);  Surgeon: Jerline Pain, MD;  Location: [facility] ENDOSCOPY;  Service: Cardiovascular;  Laterality: N/A;  . TONSILLECTOMY    . TRANSCATHETER AORTIC VALVE REPLACEMENT, TRANSFEMORAL N/A 07/08/2018   Procedure: TRANSCATHETER AORTIC VALVE REPLACEMENT, TRANSFEMORAL;  Surgeon: Burnell Blanks, MD;  Location: [place];  Service: Open Heart Surgery;  Laterality: N/A;     Medications: Current Meds  Medication Sig  . apixaban (ELIQUIS) 5 MG TABS tablet Take 1 tablet (5 mg total) by mouth 2 (two) times daily.  Marland Kitchen aspirin 81 MG tablet Take 81 mg by mouth daily.    . chlorpheniramine (CHLOR-TRIMETON) 4 MG tablet Take 4 mg by mouth at bedtime.   . cholecalciferol (VITAMIN D) 1000 units tablet Take 1,000 Units by mouth daily.  Marland Kitchen diltiazem (CARDIZEM CD) 120 MG 24 hr capsule Take 1 capsule (120 mg total) by mouth daily.  Marland Kitchen escitalopram (LEXAPRO) 20 MG tablet Take 1 tablet (20 mg total) by mouth daily.  . hydrochlorothiazide (HYDRODIURIL) 12.5 MG tablet Take 1 tablet (12.5 mg total) by mouth daily.  . insulin NPH Human (NOVOLIN N RELION) 100 UNIT/ML injection Inject 0.4 mLs (40 Units total) into the skin every morning. And syringes 1/day  . Insulin Syringe-Needle U-100 (BD INSULIN SYRINGE U/F) 31G X 5/16" 1 ML MISC USE TO INJECT INSULIN ONCE DAILY  . levothyroxine (SYNTHROID)  75 MCG tablet Take 1 tablet (75 mcg total) by mouth daily.  Marland Kitchen losartan (COZAAR) 50 MG tablet TAKE 1 TABLET(50 MG) BY MOUTH DAILY (Patient taking differently: Take 50 mg by mouth daily. )  . Multiple Vitamin (MULITIVITAMIN WITH MINERALS) TABS Take 1 tablet by mouth daily.  . nitroGLYCERIN (NITROSTAT) 0.4 MG SL tablet Place 1 tablet (0.4 mg total) under the tongue every 5 (five) minutes as needed for chest pain.  . nortriptyline (PAMELOR) 25 MG capsule TAKE 1 CAPSULE(25 MG) BY MOUTH AT BEDTIME (Patient taking differently: Take 25 mg by mouth at bedtime. )  . pantoprazole (PROTONIX) 40 MG tablet Take 1 tablet (40 mg total) by mouth 2 (two) times daily.     Allergies: Allergies  Allergen Reactions  . Statins Other (See Comments)    Muscle aches    Social History: The patient  reports that she has never smoked. She has never used smokeless tobacco. She reports that she does not drink alcohol or use drugs.   Family History: The patient's family history includes Headache in an other family member; Healthy in her son; Heart failure in her mother. She was adopted.   Review of Systems: Please see the history of present illness.   All other systems are reviewed and negative.   Physical Exam: VS:  BP 132/78 (BP Location: Right Arm, Patient Position: Sitting, Cuff Size: Normal)   Pulse 90   Ht 5' 5"  (1.651 m)   Wt 190 lb 6.4 oz (86.4 kg)   BMI 31.68 kg/m  .  BMI Body mass index is 31.68 kg/m.  Wt Readings from Last 3 Encounters:  11/12/18 190 lb 6.4 oz (86.4 kg)  10/23/18 193 lb 5.5 oz (87.7 kg)  10/17/18 198 lb (89.8 kg)    General: Alert and in no acute distress. Little unsteady. Little slow to respond to questions. Seems to have memory issues to me.   HEENT: Normal.  Neck: Supple, no JVD, carotid bruits, or masses noted.  Cardiac: Regular rate and rhythm. Harsh outflow murmur. No edema.  Respiratory:  Lungs are clear to auscultation bilaterally with normal work of breathing.  GI:  Soft and nontender.  MS: No deformity or atrophy. Gait and ROM intact but slow.  Skin: Warm and dry. Color is normal.  Neuro:  Strength and sensation are intact and no gross focal deficits noted.  Psych: Alert, appropriate and with normal affect.   LABORATORY DATA:  EKG:  EKG is ordered today. This demonstrates NSR with LBBB.  Lab Results  Component Value Date   WBC 5.7 10/23/2018   HGB 9.0 (L) 10/23/2018   HCT 29.6 (L) 10/23/2018   PLT 166 10/23/2018   GLUCOSE 209 (H) 10/23/2018   CHOL 128 10/22/2018   TRIG 95 10/22/2018   HDL 24 (L) 10/22/2018   LDLDIRECT 98.0 05/24/2014   LDLCALC 85 10/22/2018   ALT 26 10/05/2018   AST 34 10/05/2018   NA 139 10/23/2018   K 3.2 (L) 10/23/2018   CL 105 10/23/2018   CREATININE 0.81 10/23/2018   BUN 9 10/23/2018   CO2 22 10/23/2018   TSH 0.322 (L) 10/21/2018   INR 1.2 10/21/2018   HGBA1C 7.5 (H) 10/22/2018   MICROALBUR 0.8 10/07/2015     BNP (last 3 results) Recent Labs    07/07/18 1630  BNP 112.2*    ProBNP (last 3 results) Recent Labs    02/19/18 1233  PROBNP 270     Other Studies Reviewed Today:  10/21/18 ECHO  Sonographer Comments: Technically difficult study due to poor echo windows. Uncooperative and altered mental status. IMPRESSIONS  1. The left ventricle has normal systolic function, with an ejection fraction of 60-65%. The cavity size was normal. There is mildly increased left ventricular wall thickness. Left ventricular diastolic Doppler parameters are consistent with impaired  relaxation. Indeterminate filling pressures. 2. There is moderate mitral annular calcification present. Mild mitral valve stenosis.   TEE 10/07/18  IMPRESSIONS  1. The right ventricle has normal systolc function. There is no increase in right ventricular wall thickness. 2. Left atrial size was mildly dilated. 3. No evidence of a thrombus present in the left atrial appendage. 4. Mildly thickened tricuspid valve leaflets.  5. The mitral valve is degenerative. Mitral valve regurgitation is mild to moderate by color flow Doppler. A moderate pedunculated and mobile vegetation is seen on the anterior  mitral leaflet. The vegetation measures 11 mm by 18 mm. 6. 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 Tricuspid valve regurgitation is mild-moderate. 7. A 62m Edwards Sapien bioprosthetic aortic valve (TAVR) valve is present in the aortic position. Procedure Date: 07/08/2018 Normal aortic valve prosthesis. 8. The left ventricle has normal systolic function, with an ejection fraction of 60-65%. No evidence of left ventricular regional wall motion abnormalities.   Assessment & Plan    1. AF with RVR - CHADSVASC of at least 6 - has had noted 5 sec conversion pause to NSR - she has event monitor still in place - has had some brief AF noted with aberrancy per Dr. STamala Julian- she is on anticoagulation which will be lifelong - probably going to have cost issues - we will see if any assistance is available - I think she would be a poor candidate for changing to Warfarin - especially with the diffuse infarcts noted on MRI. For now, we will continue Eliquis. NSR today with LBBB.   2. Chronic anticoagulation - no active bleeding. Needs lab today.   3. Elevated troponin - low suspicion noted for ACS noted with this recent event - she has known CAD with prior PCI - no chest pain at this time - would follow.   4. Known CAD - with prior PCI - Plavix was stopped during this last admission - she has had 6 months of therapy.    5. Acute bilateral cerebellar strokes - presented with delirium - needs to follow with neurology.   6. HLD - previously unable to take statin due to allergy and myalgias - need to consider referral to lipid clinic - this was not discussed today due to the volatility of the visit - can address on return - patient is quite overwhelmed with her situation.   7. 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 Most recent TTE did not show evidence of vegetation however this "was a very limited study due to very poor echo windows and patient cooperation - no comment on valves or atrial septum noted in the report" -  We will repeat limited study next month after antibiotics completed.   8. HTN - BP is ok today.   9. DM - per PCP  10. Prior TAVR   11. Hypothyroid - dose has been cut back due to low TSH - needs follow up lab - checking today  12. Hypokalemia - needs labs  13. Anemia - rechecking labs today  14. Overall deconditioning/DOE - seems to be the crux of her issues - discussed at length - she does not seem to understand the need to start increasing her activity despite a lengthy conversation over this today. Her sleep pattern is certainly abnormal - she does not seem willing to change at this time. Overall prognosis seems quite tenuous to me going forward.   131 COVID-19 Education: The signs and symptoms of COVID-19 were discussed with the patient and how to seek care for testing (follow up with PCP or arrange E-visit).  The importance of social distancing, staying at home, hand hygiene and wearing a mask when out in public were discussed today.  Current medicines are reviewed with the patient today.  The patient does not have concerns regarding medicines other than what has been noted above.  The following changes have been made:  See  above.  Labs/ tests ordered today include:    Orders Placed This Encounter  Procedures  . Basic metabolic panel  . CBC  . TSH  . EKG 12-Lead     Disposition:   FU with Dr. Tamala Julian next month after echo. Lab today.   Patient is agreeable to this plan and will call if any problems develop in the interim.   SignedTruitt Merle, NP  11/12/2018 9:11 AM  [facility] [address]  [place], [place]  [address] Phone: [phone] Fax: [phone]

## 2018-11-12 ENCOUNTER — Encounter: Payer: Self-pay | Admitting: Nurse Practitioner

## 2018-11-12 ENCOUNTER — Other Ambulatory Visit: Payer: Self-pay

## 2018-11-12 ENCOUNTER — Other Ambulatory Visit: Payer: Self-pay | Admitting: Pharmacy Technician

## 2018-11-12 ENCOUNTER — Ambulatory Visit (INDEPENDENT_AMBULATORY_CARE_PROVIDER_SITE_OTHER): Payer: Medicare Other | Admitting: Nurse Practitioner

## 2018-11-12 VITALS — BP 132/78 | HR 90 | Ht 65.0 in | Wt 190.4 lb

## 2018-11-12 DIAGNOSIS — I330 Acute and subacute infective endocarditis: Secondary | ICD-10-CM

## 2018-11-12 DIAGNOSIS — E876 Hypokalemia: Secondary | ICD-10-CM | POA: Diagnosis not present

## 2018-11-12 DIAGNOSIS — I480 Paroxysmal atrial fibrillation: Secondary | ICD-10-CM

## 2018-11-12 DIAGNOSIS — I639 Cerebral infarction, unspecified: Secondary | ICD-10-CM | POA: Diagnosis not present

## 2018-11-12 DIAGNOSIS — E039 Hypothyroidism, unspecified: Secondary | ICD-10-CM

## 2018-11-12 LAB — BASIC METABOLIC PANEL
BUN/Creatinine Ratio: 16 (ref 12–28)
BUN: 14 mg/dL (ref 8–27)
CO2: 22 mmol/L (ref 20–29)
Calcium: 10.6 mg/dL — ABNORMAL HIGH (ref 8.7–10.3)
Chloride: 96 mmol/L (ref 96–106)
Creatinine, Ser: 0.86 mg/dL (ref 0.57–1.00)
GFR calc Af Amer: 77 mL/min/{1.73_m2} (ref >59)
GFR calc non Af Amer: 67 mL/min/{1.73_m2} (ref >59)
Glucose: 219 mg/dL — ABNORMAL HIGH (ref 65–99)
Potassium: 5 mmol/L (ref 3.5–5.2)
Sodium: 137 mmol/L (ref 134–144)

## 2018-11-12 LAB — CBC
Hematocrit: 36.6 % (ref 34.0–46.6)
Hemoglobin: 11 g/dL — ABNORMAL LOW (ref 11.1–15.9)
MCH: 19.6 pg — ABNORMAL LOW (ref 26.6–33.0)
MCHC: 30.1 g/dL — ABNORMAL LOW (ref 31.5–35.7)
MCV: 65 fL — ABNORMAL LOW (ref 79–97)
Platelets: 238 10*3/uL (ref 150–450)
RBC: 5.62 x10E6/uL — ABNORMAL HIGH (ref 3.77–5.28)
RDW: 18.2 % — ABNORMAL HIGH (ref 11.7–15.4)
WBC: 7.8 10*3/uL (ref 3.4–10.8)

## 2018-11-12 LAB — TSH: TSH: 9.46 u[IU]/mL — ABNORMAL HIGH (ref 0.450–4.500)

## 2018-11-12 NOTE — Patient Instructions (Addendum)
After Visit Summary:  We will be checking the following labs today - BMET, CBC and TSH   Medication Instructions:    Continue with your current medicines.    If you need a refill on your cardiac medications before your next appointment, please call your pharmacy.     Testing/Procedures To Be Arranged:  Limited echo - middle of August  Finish wearing the heart monitor  Follow-Up:   See Dr. Tamala Julian as planned next month    At [facility], you and your health needs are our priority.  As part of our continuing mission to provide you with exceptional heart care, we have created designated Provider Care Teams.  These Care Teams include your primary Cardiologist (physician) and Advanced Practice Providers (APPs -  Physician Assistants and Nurse Practitioners) who all work together to provide you with the care you need, when you need it.  Special Instructions:  . Stay safe, stay home, wash your hands for at least 20 seconds and wear a mask when out in public.  . I will see if our staff can look into getting the cost of Eliquis cheaper for you . Think about what we talked about today - you need to try and start walking - start at 5 minutes a day - increase by one minute as tolerated each day   Call the [place] office at [phone] if you have any questions, problems or concerns.

## 2018-11-12 NOTE — Patient Outreach (Signed)
[facility]) Care Management  11/12/2018  KRISHAWNA STIEFEL November 04, 1945 [id number]  Successful outreach call placed to patient in regards to [employer] application for Novolin N and BMS application for Eliquis.  Spoke to patient and her husband, HIPAA identifiers verified.  Patient's husband said the cardiology office provided them with a BMS application today. He inquired as to which one to completed.   Informed patient and her husband that we mailed out both the BMS application and the [employer] application to them. Informed them that the application was mostly filled out with the exception of some highlighted areas that they would have to fill in the information. Informed them that I had received the provider's portion back from [facility], NP and that once I received their portion along with the supporting documentation then I would be able to submit and followup on the applications. Patient informed she believes they received that information but had discarded it. Patient's husband inquired if the information could be mailed out again.  Will plan to mail out the applications again and followup with patient in 5-7 business days.  Gayleen P. Isador Castille, [place] Management [phone]

## 2018-11-13 ENCOUNTER — Telehealth: Payer: Self-pay | Admitting: Endocrinology

## 2018-11-13 NOTE — Telephone Encounter (Signed)
please contact patient: Please move up next appt to next avail

## 2018-11-14 ENCOUNTER — Ambulatory Visit: Payer: Medicare Other | Admitting: *Deleted

## 2018-11-14 ENCOUNTER — Other Ambulatory Visit: Payer: Self-pay

## 2018-11-14 NOTE — Telephone Encounter (Signed)
Please review Dr. Cordelia Pen request to change appt

## 2018-11-17 ENCOUNTER — Emergency Department (HOSPITAL_COMMUNITY): Payer: Medicare Other

## 2018-11-17 ENCOUNTER — Emergency Department (HOSPITAL_COMMUNITY)
Admission: EM | Admit: 2018-11-17 | Discharge: 2018-11-17 | Disposition: A | Discharge status: Home / Self Care | Payer: Medicare Other | Attending: Emergency Medicine | Admitting: Emergency Medicine

## 2018-11-17 ENCOUNTER — Ambulatory Visit: Payer: Medicare Other | Admitting: Endocrinology

## 2018-11-17 ENCOUNTER — Other Ambulatory Visit: Payer: Self-pay

## 2018-11-17 DIAGNOSIS — E039 Hypothyroidism, unspecified: Secondary | ICD-10-CM | POA: Diagnosis not present

## 2018-11-17 DIAGNOSIS — Z7982 Long term (current) use of aspirin: Secondary | ICD-10-CM | POA: Insufficient documentation

## 2018-11-17 DIAGNOSIS — Z452 Encounter for adjustment and management of vascular access device: Secondary | ICD-10-CM | POA: Diagnosis not present

## 2018-11-17 DIAGNOSIS — E119 Type 2 diabetes mellitus without complications: Secondary | ICD-10-CM | POA: Diagnosis not present

## 2018-11-17 DIAGNOSIS — I10 Essential (primary) hypertension: Secondary | ICD-10-CM | POA: Diagnosis not present

## 2018-11-17 DIAGNOSIS — I447 Left bundle-branch block, unspecified: Secondary | ICD-10-CM | POA: Diagnosis not present

## 2018-11-17 DIAGNOSIS — Z8673 Personal history of transient ischemic attack (TIA), and cerebral infarction without residual deficits: Secondary | ICD-10-CM | POA: Insufficient documentation

## 2018-11-17 DIAGNOSIS — I330 Acute and subacute infective endocarditis: Secondary | ICD-10-CM | POA: Diagnosis not present

## 2018-11-17 DIAGNOSIS — R519 Headache, unspecified: Secondary | ICD-10-CM

## 2018-11-17 DIAGNOSIS — I6523 Occlusion and stenosis of bilateral carotid arteries: Secondary | ICD-10-CM | POA: Diagnosis not present

## 2018-11-17 DIAGNOSIS — R51 Headache: Secondary | ICD-10-CM | POA: Diagnosis not present

## 2018-11-17 DIAGNOSIS — Z7901 Long term (current) use of anticoagulants: Secondary | ICD-10-CM | POA: Insufficient documentation

## 2018-11-17 DIAGNOSIS — E1165 Type 2 diabetes mellitus with hyperglycemia: Secondary | ICD-10-CM | POA: Diagnosis not present

## 2018-11-17 DIAGNOSIS — B954 Other streptococcus as the cause of diseases classified elsewhere: Secondary | ICD-10-CM | POA: Diagnosis not present

## 2018-11-17 DIAGNOSIS — Z794 Long term (current) use of insulin: Secondary | ICD-10-CM | POA: Insufficient documentation

## 2018-11-17 DIAGNOSIS — Z79899 Other long term (current) drug therapy: Secondary | ICD-10-CM | POA: Insufficient documentation

## 2018-11-17 DIAGNOSIS — R7881 Bacteremia: Secondary | ICD-10-CM | POA: Diagnosis not present

## 2018-11-17 DIAGNOSIS — I2510 Atherosclerotic heart disease of native coronary artery without angina pectoris: Secondary | ICD-10-CM | POA: Insufficient documentation

## 2018-11-17 LAB — CBG MONITORING, ED
Glucose-Capillary: 249 mg/dL — ABNORMAL HIGH (ref 70–99)
Glucose-Capillary: 184 mg/dL — ABNORMAL HIGH (ref 70–99)

## 2018-11-17 LAB — BASIC METABOLIC PANEL
Anion gap: 13 (ref 5–15)
BUN: 12 mg/dL (ref 8–23)
CO2: 24 mmol/L (ref 22–32)
Calcium: 10.4 mg/dL — ABNORMAL HIGH (ref 8.9–10.3)
Chloride: 99 mmol/L (ref 98–111)
Creatinine, Ser: 0.69 mg/dL (ref 0.44–1.00)
GFR calc Af Amer: >60 mL/min (ref >60)
GFR calc non Af Amer: >60 mL/min (ref >60)
Glucose, Bld: 249 mg/dL — ABNORMAL HIGH (ref 70–99)
Potassium: 3.9 mmol/L (ref 3.5–5.1)
Sodium: 136 mmol/L (ref 135–145)

## 2018-11-17 LAB — CBC WITH DIFFERENTIAL/PLATELET
Abs Immature Granulocytes: 0.02 10*3/uL (ref 0.00–0.07)
Basophils Absolute: 0.1 10*3/uL (ref 0.0–0.1)
Basophils Relative: 1 %
Eosinophils Absolute: 0 10*3/uL (ref 0.0–0.5)
Eosinophils Relative: 0 %
HCT: 34.4 % — ABNORMAL LOW (ref 36.0–46.0)
Hemoglobin: 10.4 g/dL — ABNORMAL LOW (ref 12.0–15.0)
Immature Granulocytes: 0 %
Lymphocytes Relative: 13 %
Lymphs Abs: 0.6 10*3/uL — ABNORMAL LOW (ref 0.7–4.0)
MCH: 18.8 pg — ABNORMAL LOW (ref 26.0–34.0)
MCHC: 30.2 g/dL (ref 30.0–36.0)
MCV: 62.1 fL — ABNORMAL LOW (ref 80.0–100.0)
Monocytes Absolute: 0.4 10*3/uL (ref 0.1–1.0)
Monocytes Relative: 9 %
Neutro Abs: 3.9 10*3/uL (ref 1.7–7.7)
Neutrophils Relative %: 77 %
Platelets: 178 10*3/uL (ref 150–400)
RBC: 5.54 MIL/uL — ABNORMAL HIGH (ref 3.87–5.11)
RDW: 15.7 % — ABNORMAL HIGH (ref 11.5–15.5)
WBC: 5.1 10*3/uL (ref 4.0–10.5)
nRBC: 0 % (ref 0.0–0.2)

## 2018-11-17 MED ORDER — PROMETHAZINE HCL 25 MG/ML IJ SOLN: 6.2500 mg | Freq: Once | INTRAMUSCULAR | Status: DC

## 2018-11-17 MED ORDER — PROCHLORPERAZINE EDISYLATE 10 MG/2ML IJ SOLN: 5.0000 mg | Freq: Once | INTRAMUSCULAR | Status: DC

## 2018-11-17 MED ORDER — ACETAMINOPHEN 500 MG PO TABS
1000.0000 mg | ORAL_TABLET | Freq: Once | ORAL | Status: AC
  Administered 2018-11-17: 1000 mg via ORAL
  Filled 2018-11-17: qty 2

## 2018-11-17 MED ORDER — IOHEXOL 350 MG/ML SOLN
50.0000 mL | Freq: Once | INTRAVENOUS | Status: AC | PRN
  Administered 2018-11-17: 12:00:00 50 mL via INTRAVENOUS

## 2018-11-17 MED ORDER — DIPHENHYDRAMINE HCL 50 MG/ML IJ SOLN
12.5000 mg | Freq: Once | INTRAMUSCULAR | Status: AC
  Administered 2018-11-17: 12.5 mg via INTRAVENOUS
  Filled 2018-11-17: qty 1

## 2018-11-17 MED ORDER — PROCHLORPERAZINE EDISYLATE 10 MG/2ML IJ SOLN
5.0000 mg | Freq: Once | INTRAMUSCULAR | Status: AC
  Administered 2018-11-17: 15:00:00 5 mg via INTRAVENOUS
  Filled 2018-11-17: qty 2

## 2018-11-17 MED ORDER — METOCLOPRAMIDE HCL 5 MG/ML IJ SOLN
10.0000 mg | Freq: Once | INTRAMUSCULAR | Status: AC
  Administered 2018-11-17: 10 mg via INTRAVENOUS
  Filled 2018-11-17: qty 2

## 2018-11-17 NOTE — ED Triage Notes (Signed)
Pt here for evaluation of severe headache onset 0830. Hx stroke five weeks ago. Has not taken any OTC meds for pain today.

## 2018-11-17 NOTE — ED Provider Notes (Signed)
[facility] EMERGENCY DEPARTMENT Provider Note   CSN: [id number] Arrival date & time: 11/17/18  8016    History   Chief Complaint Chief Complaint  Patient presents with   Headache    HPI Breanna Webster is a 73 y.o. female with history of DM, left ear s/p TAVR March 2020, mitral valve endocarditis on gentamicin/Rocephin via PICC line, hypothyroidism, HTN, CAD s/p stent, recent cerebellar infarcts, atrial fibrillation on Cardizem and Eliquis presents to the ER for evaluation of headache.  Sudden onset, noticed this morning when she woke up around 8:30 AM.  Described as "squeezing" localized to the left parietal and occipital area and at the base of her left neck.  Constant.  Associated with sensitivity to light.  It is worse when she lays flat or when she moves her neck.  No alleviating factors.  No interventions.  States since her stroke she has had residual "stroke symptoms" specifically intermittent in word finding, changes in her vision and "weakness" when she walks that makes her feel unsteady.  States now she can see better from far away but cannot see up close.  No new changes to the symptoms since onset of headache.  No recent head or neck trauma.  No associated fever, diplopia, vision loss, difficulty swallowing or with speech, unilateral weakness, numbness.  Denies previous history of headaches.  She last received her antibiotics this morning.     HPI  Past Medical History:  Diagnosis Date   Arthritis    "back" (04/22/2018)   CAD (coronary artery disease)    a. 03/2018 s/p PCI/DES to the RCA (3.0x15 Onyx DES).   Carotid artery stenosis    Mild   Chronic lower back pain    Colon polyps    Diverticulitis    Diverticulosis    Esophageal thickening    seen on pre TAVR CT scan, also questionable cirrhosis. MRI recommended. Will refer to GI   GERD (gastroesophageal reflux disease)    Grave's disease    History of colonic polyps 05/22/2017    History of hiatal hernia    Hypertension    Hypothyroidism    IBS (irritable bowel syndrome)    Osteopenia    Pulmonary nodules    seen on pre TAVR CT. likley benign. no follow up recommended if pt low risk.   S/P TAVR (transcatheter aortic valve replacement)    Severe aortic stenosis    Thalassemia minor    Type II diabetes mellitus [facility])     Patient Active Problem List   Diagnosis Date Noted   Atrial fibrillation with RVR ([place]) 10/21/2018   Cerebellar stroke, acute ([place]) 10/21/2018   Streptococcal endocarditis    Endocarditis of mitral valve 10/07/2018   Bacteremia due to Streptococcus Salivarius 10/07/2018   Sepsis ([place]) 55/37/4827   Acute metabolic encephalopathy 07/86/7544   Severe aortic stenosis 07/08/2018   S/P TAVR (transcatheter aortic valve replacement) 07/08/2018   Esophageal thickening    CAD in native artery 04/22/2018   Gastroesophageal reflux disease    Pulmonary hypertension ([place]) 02/21/2018   Essential hypertension 07/15/2017   History of colonic polyps 05/22/2017   Elevated liver function tests 12/05/2016   Thalassemia minor 05/29/2016   Left bundle branch block 12/06/2015   Upper airway cough syndrome 10/14/2015   Myalgia 02/17/2014   Carotid artery stenosis 06/05/2013   Eustachian tube dysfunction 05/07/2013   Neuropathy of leg 03/07/2012   Anemia, unspecified 01/31/2012   Hot flashes 08/24/2010   Diabetes mellitus due  to underlying condition, uncontrolled ([place]) 06/30/2010   Obesity 12/21/2009   Vitamin D deficiency 03/10/2009   Hypothyroidism 12/13/2008   Dyslipidemia 12/13/2008   Anxiety state 12/13/2008   Other specified disorders of bladder 12/13/2008    Past Surgical History:  Procedure Laterality Date   24 HOUR [place] STUDY N/A 03/03/2018   Procedure: 24 HOUR [place];  Surgeon: Mauri Pole, MD;  Location: [facility] ENDOS[facility];  Service: Endoscopy;  Laterality: N/A;   COLONOSCOPY W/ BIOPSIES AND  POLYPECTOMY     CORONARY ANGIOGRAPHY Right 04/21/2018   Procedure: CORONARY ANGIOGRAPHY (CATH LAB);  Surgeon: [name], MD;  Location: [facility] CV LAB;  Service: Cardiovascular;  Laterality: Right;   CORONARY STENT INTERVENTION N/A 04/22/2018   Procedure: CORONARY STENT INTERVENTION;  Surgeon: [name], MD;  Location: [facility] CV LAB;  Service: Cardiovascular;  Laterality: N/A;   DILATION AND CURETTAGE OF UTERUS     ESOPHAGEAL MANOMETRY N/A 03/03/2018   Procedure: ESOPHAGEAL MANOMETRY (EM);  Surgeon: Mauri Pole, MD;  Location: [facility] ENDOS[facility];  Service: Endoscopy;  Laterality: N/A;   HERNIA REPAIR     HYSTEROSCOPY     fibroids   LAPAROSCOPIC CHOLECYSTECTOMY     LAPAROSCOPY     fibroids   NISSEN FUNDOPLICATION  [id number]   RIGHT/LEFT HEART CATH AND CORONARY ANGIOGRAPHY N/A 02/20/2018   Procedure: RIGHT/LEFT HEART CATH AND CORONARY ANGIOGRAPHY;  Surgeon: [name], MD;  Location: [facility] CV LAB;  Service: Cardiovascular;  Laterality: N/A;   TEE WITHOUT CARDIOVERSION N/A 07/08/2018   Procedure: TRANSESOPHAGEAL ECHOCARDIOGRAM (TEE);  Surgeon: Burnell Blanks, MD;  Location: [facility];  Service: Open Heart Surgery;  Laterality: N/A;   TEE WITHOUT CARDIOVERSION  10/07/2018   TEE WITHOUT CARDIOVERSION N/A 10/07/2018   Procedure: TRANSESOPHAGEAL ECHOCARDIOGRAM (TEE);  Surgeon: Jerline Pain, MD;  Location: [facility] ENDOSCOPY;  Service: Cardiovascular;  Laterality: N/A;   TONSILLECTOMY     TRANSCATHETER AORTIC VALVE REPLACEMENT, TRANSFEMORAL N/A 07/08/2018   Procedure: TRANSCATHETER AORTIC VALVE REPLACEMENT, TRANSFEMORAL;  Surgeon: Burnell Blanks, MD;  Location: [facility];  Service: Open Heart Surgery;  Laterality: N/A;     OB History    Gravida  2   Para  2   Term  2   Preterm      AB      Living        SAB      TAB      Ectopic      Multiple      Live Births               Home Medications    Prior to Admission  medications   Medication Sig Start Date End Date Taking? Authorizing Provider  apixaban (ELIQUIS) 5 MG TABS tablet Take 1 tablet (5 mg total) by mouth 2 (two) times daily. 10/23/18   Shelly Coss, MD  aspirin 81 MG tablet Take 81 mg by mouth daily.      [provider]  chlorpheniramine (CHLOR-TRIMETON) 4 MG tablet Take 4 mg by mouth at bedtime.     [provider]  cholecalciferol (VITAMIN D) 1000 units tablet Take 1,000 Units by mouth daily.    [provider]  diltiazem (CARDIZEM CD) 120 MG 24 hr capsule Take 1 capsule (120 mg total) by mouth daily. 10/23/18   Shelly Coss, MD  escitalopram (LEXAPRO) 20 MG tablet Take 1 tablet (20 mg total) by mouth daily. 10/21/18   [name], NP  hydrochlorothiazide (  HYDRODIURIL) 12.5 MG tablet Take 1 tablet (12.5 mg total) by mouth daily. 10/21/18   Nafziger, Tommi Rumps, NP  insulin NPH Human (NOVOLIN N RELION) 100 UNIT/ML injection Inject 0.4 mLs (40 Units total) into the skin every morning. And syringes 1/day 10/09/18   Kayleen Memos, DO  Insulin Syringe-Needle U-100 (BD INSULIN SYRINGE U/F) 31G X 5/16" 1 ML MISC USE TO INJECT INSULIN ONCE DAILY 03/27/18   Renato Shin, MD  levothyroxine (SYNTHROID) 75 MCG tablet Take 1 tablet (75 mcg total) by mouth daily. 10/23/18 10/23/19  Shelly Coss, MD  losartan (COZAAR) 50 MG tablet TAKE 1 TABLET(50 MG) BY MOUTH DAILY Patient taking differently: Take 50 mg by mouth daily.  10/01/18   Nafziger, Tommi Rumps, NP  Multiple Vitamin (MULITIVITAMIN WITH MINERALS) TABS Take 1 tablet by mouth daily.    [provider]  nitroGLYCERIN (NITROSTAT) 0.4 MG SL tablet Place 1 tablet (0.4 mg total) under the tongue every 5 (five) minutes as needed for chest pain. 04/23/18   Theora Gianotti, NP  nortriptyline (PAMELOR) 25 MG capsule TAKE 1 CAPSULE(25 MG) BY MOUTH AT BEDTIME Patient taking differently: Take 25 mg by mouth at bedtime.  10/01/18   Nafziger, Tommi Rumps, NP  pantoprazole (PROTONIX) 40 MG tablet  Take 1 tablet (40 mg total) by mouth 2 (two) times daily. 10/01/18   [name], [name], MD    Family History Family History  Adopted: Yes  Problem Relation Age of Onset   Healthy Son        x 2   Headache Other        Cluster headaches   Heart failure Mother    Colon cancer Neg Hx    Pancreatic cancer Neg Hx    Rectal cancer Neg Hx    Stomach cancer Neg Hx     Social History Social History   Tobacco Use   Smoking status: Never Smoker   Smokeless tobacco: Never Used  Substance Use Topics   Alcohol use: No    Alcohol/week: 0.0 standard drinks   Drug use: Never     Allergies   Statins   Review of Systems Review of Systems  Eyes: Positive for photophobia.  Musculoskeletal: Positive for neck pain.  Neurological: Positive for headaches.  All other systems reviewed and are negative.    Physical Exam Updated Vital Signs BP (!) 141/88 (BP Location: Right Arm)    Pulse 90    Temp 98.2 F (36.8 C) (Oral)    Resp 17    Ht 5' 5"  (1.651 m)    Wt 85.7 kg    SpO2 96%    BMI 31.45 kg/m   Physical Exam Vitals signs and nursing note reviewed.  Constitutional:      General: She is not in acute distress.    Appearance: She is well-developed.     Comments: NAD.  HENT:     Head: Normocephalic and atraumatic.     Right Ear: External ear normal.     Left Ear: External ear normal.     Nose: Nose normal.  Eyes:     General: No scleral icterus.    Conjunctiva/sclera: Conjunctivae normal.  Neck:     Musculoskeletal: Normal range of motion and neck supple.     Comments: C-spine: No midline or paraspinal muscle tenderness.  Full range of motion of the neck without meningismus although patient complains of pain with neck flexion and left rotation/bend.  No carotid bruits.  Trachea is midline. Cardiovascular:  Rate and Rhythm: Normal rate and regular rhythm.     Heart sounds: Normal heart sounds.  Pulmonary:     Effort: Pulmonary effort is normal.     Breath  sounds: Normal breath sounds.  Musculoskeletal: Normal range of motion.        General: No deformity.  Skin:    General: Skin is warm and dry.     Capillary Refill: Capillary refill takes less than 2 seconds.  Neurological:     Mental Status: She is alert and oriented to person, place, and time.     Comments:  Alert and oriented to self, place, time and event.  Speech is fluent without dysarthria or dysphasia. Strength 5/5 in upper/lower extremities. Sensation to light touch intact in face, upper/lower extremities. Sits on side of the bed without truncal sway No pronator drift. No leg drop. Normal finger-to-nose and heel to shin.  CN I not tested CN II grossly intact visual fields bilaterally. Unable to visualize posterior eye. CN III, IV, VI PEERL and EOMs intact bilaterally CN V light touch intact in all 3 divisions of trigeminal nerve CN VII facial movements symmetric CN VIII not tested CN IX, X no uvula deviation, symmetric rise of soft palate  CN XI 5/5 SCM and trapezius strength bilaterally  CN XII Midline tongue protrusion, symmetric L/R movements  Psychiatric:        Behavior: Behavior normal.        Thought Content: Thought content normal.        Judgment: Judgment normal.      ED Treatments / Results  Labs (all labs ordered are listed, but only abnormal results are displayed) Labs Reviewed  CBC WITH DIFFERENTIAL/PLATELET - Abnormal; Notable for the following components:      Result Value   RBC 5.54 (*)    Hemoglobin 10.4 (*)    HCT 34.4 (*)    MCV 62.1 (*)    MCH 18.8 (*)    RDW 15.7 (*)    Lymphs Abs 0.6 (*)    All other components within normal limits  BASIC METABOLIC PANEL - Abnormal; Notable for the following components:   Glucose, Bld 249 (*)    Calcium 10.4 (*)    All other components within normal limits  CBG MONITORING, ED - Abnormal; Notable for the following components:   Glucose-Capillary 249 (*)    All other components within normal limits    CBG MONITORING, ED - Abnormal; Notable for the following components:   Glucose-Capillary 184 (*)    All other components within normal limits    EKG EKG Interpretation  Date/Time:  Monday November 17 2018 09:52:34 EDT Ventricular Rate:  76 PR Interval:    QRS Duration: 169 QT Interval:  479 QTC Calculation: 539 R Axis:   -62 Text Interpretation:  Sinus rhythm Left bundle branch block No significant change since last tracing Confirmed by Blanchie Dessert [phone]) on 11/17/2018 11:57:14 AM   Radiology Ct Angio Head W Or Wo Contrast  Result Date: 11/17/2018 CLINICAL DATA:  Left parietal/occipital headache. Additional history: On blood thinners, evaluate for dissection, history of cerebellar strokes without new deficits today. EXAM: CT ANGIOGRAPHY HEAD AND NECK TECHNIQUE: Multidetector CT imaging of the head and neck was performed using the standard protocol during bolus administration of intravenous contrast. Multiplanar CT image reconstructions and MIPs were obtained to evaluate the vascular anatomy. Carotid stenosis measurements (when applicable) are obtained utilizing NASCET criteria, using the distal internal carotid diameter as the denominator. CONTRAST:  40m OMNIPAQUE IOHEXOL 350 MG/ML SOLN COMPARISON:  Brain MRI 10/22/2018, CT angiography head/neck and non-contrast CT head 10/21/2018, chest CT 10/04/2018, report for chest CT 06/17/2018 (images currently unavailable). FINDINGS: CT HEAD FINDINGS Brain: There is no evidence of acute intracranial hemorrhage or acute demarcated territorial infarction. No evidence of intracranial mass. No midline shift or extra-axial collection. Multiple small previous supratentorial and infratentorial infarcts were better appreciated on prior brain MRI 10/22/2018. Mild generalized parenchymal atrophy. Ill-defined hypoattenuation of the cerebral white matter consistent with chronic small vessel ischemic disease. Vascular: Reported separately. Skull: Normal. Negative  for fracture or focal lesion. Sinuses: Trace ethmoid sinus mucosal thickening. Orbits: The imaged globes and orbits are unremarkable. Review of the MIP images confirms the above findings CTA NECK FINDINGS Aortic arch: Atherosclerotic disease of the imaged aortic arch and major branch vessels. No aneurysm or dissection. Left vertebral artery origin from the aortic arch. Right carotid system: Mild scattered atherosclerotic disease. No evidence of dissection, stenosis (50% or greater) or occlusion. Left carotid system: Mild scattered atherosclerotic disease. No evidence of dissection, stenosis (50% or greater) or occlusion. Vertebral arteries: No evidence of dissection, stenosis (50% or greater) or occlusion. The right vertebral artery is dominant. Skeleton: No acute bony abnormality. Cervical spondylosis without high-grade bony spinal canal narrowing. Other neck: The thyroid gland is poorly delineated. A hyperenhancing ovoid focus measuring 6 mm in short axis in the lower right neck, in the right paratracheal region, is unchanged dating back at least to prior chest CT 10/04/2018 (series 9, image 125). Upper chest: No abnormality. Review of the MIP images confirms the above findings CTA HEAD FINDINGS Anterior circulation: No large vessel occlusion or proximal high-grade arterial stenosis. Atherosclerotic disease of the carotid artery siphons without significant luminal narrowing. No intracranial aneurysm is identified. Posterior circulation: No large vessel occlusion or proximal high-grade arterial stenosis.No intracranial aneurysm is identified. Venous sinuses: Within the limitations of contrast timing, no evidence of thrombosis. Anatomic variants: The right posterior communicating artery appears hypoplastic or absent. Review of the MIP images confirms the above findings IMPRESSION: CT HEAD: - No evidence of acute intracranial abnormality. - Multiple small previous supratentorial and infratentorial infarcts were  better appreciated on prior brain MRI 10/22/2018. - Mild generalized parenchymal atrophy and chronic small vessel ischemic disease. CTA HEAD: - No large vessel occlusion or proximal high-grade arterial stenosis. - Atherosclerotic calcification within the bilateral carotid artery siphons without significant luminal narrowing. - No intracranial aneurysm is identified. CTA NECK: - The carotid and vertebral arteries are patent within the neck without significant stenosis. Mild scattered atherosclerotic disease within the bilateral carotid systems. - 6 mm ovoid hyperenhancing focus in the lower right neck, in the right paratracheal region, unchanged dating at least back to chest CT 10/03/2018. This finding may reflect residual thyroid tissue, a nonspecific hyperenhancing lymph node, or parathyroid gland. Electronically Signed   By: KKellie Simmering  On: 11/17/2018 14:28   Ct Angio Neck W And/or Wo Contrast  Result Date: 11/17/2018 CLINICAL DATA:  Left parietal/occipital headache. Additional history: On blood thinners, evaluate for dissection, history of cerebellar strokes without new deficits today. EXAM: CT ANGIOGRAPHY HEAD AND NECK TECHNIQUE: Multidetector CT imaging of the head and neck was performed using the standard protocol during bolus administration of intravenous contrast. Multiplanar CT image reconstructions and MIPs were obtained to evaluate the vascular anatomy. Carotid stenosis measurements (when applicable) are obtained utilizing NASCET criteria, using the distal internal carotid diameter as the denominator. CONTRAST:  552mOMNIPAQUE IOHEXOL 350  MG/ML SOLN COMPARISON:  Brain MRI 10/22/2018, CT angiography head/neck and non-contrast CT head 10/21/2018, chest CT 10/04/2018, report for chest CT 06/17/2018 (images currently unavailable). FINDINGS: CT HEAD FINDINGS Brain: There is no evidence of acute intracranial hemorrhage or acute demarcated territorial infarction. No evidence of intracranial mass. No  midline shift or extra-axial collection. Multiple small previous supratentorial and infratentorial infarcts were better appreciated on prior brain MRI 10/22/2018. Mild generalized parenchymal atrophy. Ill-defined hypoattenuation of the cerebral white matter consistent with chronic small vessel ischemic disease. Vascular: Reported separately. Skull: Normal. Negative for fracture or focal lesion. Sinuses: Trace ethmoid sinus mucosal thickening. Orbits: The imaged globes and orbits are unremarkable. Review of the MIP images confirms the above findings CTA NECK FINDINGS Aortic arch: Atherosclerotic disease of the imaged aortic arch and major branch vessels. No aneurysm or dissection. Left vertebral artery origin from the aortic arch. Right carotid system: Mild scattered atherosclerotic disease. No evidence of dissection, stenosis (50% or greater) or occlusion. Left carotid system: Mild scattered atherosclerotic disease. No evidence of dissection, stenosis (50% or greater) or occlusion. Vertebral arteries: No evidence of dissection, stenosis (50% or greater) or occlusion. The right vertebral artery is dominant. Skeleton: No acute bony abnormality. Cervical spondylosis without high-grade bony spinal canal narrowing. Other neck: The thyroid gland is poorly delineated. A hyperenhancing ovoid focus measuring 6 mm in short axis in the lower right neck, in the right paratracheal region, is unchanged dating back at least to prior chest CT 10/04/2018 (series 9, image 125). Upper chest: No abnormality. Review of the MIP images confirms the above findings CTA HEAD FINDINGS Anterior circulation: No large vessel occlusion or proximal high-grade arterial stenosis. Atherosclerotic disease of the carotid artery siphons without significant luminal narrowing. No intracranial aneurysm is identified. Posterior circulation: No large vessel occlusion or proximal high-grade arterial stenosis.No intracranial aneurysm is identified. Venous  sinuses: Within the limitations of contrast timing, no evidence of thrombosis. Anatomic variants: The right posterior communicating artery appears hypoplastic or absent. Review of the MIP images confirms the above findings IMPRESSION: CT HEAD: - No evidence of acute intracranial abnormality. - Multiple small previous supratentorial and infratentorial infarcts were better appreciated on prior brain MRI 10/22/2018. - Mild generalized parenchymal atrophy and chronic small vessel ischemic disease. CTA HEAD: - No large vessel occlusion or proximal high-grade arterial stenosis. - Atherosclerotic calcification within the bilateral carotid artery siphons without significant luminal narrowing. - No intracranial aneurysm is identified. CTA NECK: - The carotid and vertebral arteries are patent within the neck without significant stenosis. Mild scattered atherosclerotic disease within the bilateral carotid systems. - 6 mm ovoid hyperenhancing focus in the lower right neck, in the right paratracheal region, unchanged dating at least back to chest CT 10/03/2018. This finding may reflect residual thyroid tissue, a nonspecific hyperenhancing lymph node, or parathyroid gland. Electronically Signed   By: Kellie Simmering   On: 11/17/2018 14:28    Procedures Procedures (including critical care time)  Medications Ordered in ED Medications  metoCLOPramide (REGLAN) injection 10 mg (10 mg Intravenous Given 11/17/18 1001)  acetaminophen (TYLENOL) tablet 1,000 mg (1,000 mg Oral Given 11/17/18 0959)  iohexol (OMNIPAQUE) 350 MG/ML injection 50 mL (50 mLs Intravenous Contrast Given 11/17/18 1145)  prochlorperazine (COMPAZINE) injection 5 mg (5 mg Intravenous Given 11/17/18 1439)  diphenhydrAMINE (BENADRYL) injection 12.5 mg (12.5 mg Intravenous Given 11/17/18 1439)     Initial Impression / Assessment and Plan / ED Course  I have reviewed the triage vital signs and the nursing notes.  Pertinent labs & imaging results that were  available during my care of the patient were reviewed by me and considered in my medical decision making (see chart for details).  Clinical Course as of Nov 17 1906  Mon Nov 17, 2018  1110 Hemoglobin(!): 10.4 [CG]  1345 Re-evaluated pt. She reports persistent headache not relieved with reglan, tylenol. Compazine, benadryl ordered.  Apologized regarding CTA delayed    [CG]  1448 CTA HEAD:  - No large vessel occlusion or proximal high-grade arterial stenosis.  - Atherosclerotic calcification within the bilateral carotid artery siphons without significant luminal narrowing.  - No intracranial aneurysm is identified.  CTA NECK:  - The carotid and vertebral arteries are patent within the neck without significant stenosis. Mild scattered atherosclerotic disease within the bilateral carotid systems.  - 6 mm ovoid hyperenhancing focus in the lower right neck, in the right paratracheal region, unchanged dating at least back to chest CT 10/03/2018. This finding may reflect residual thyroid tissue, a nonspecific hyperenhancing lymph node, or parathyroid gland.  CT Angio Head W or Wo Contrast [CG]    Clinical Course User Index [CG] Kinnie Feil, PA-C   I have reviewed pt's EMR to obtain pertinent PMH including recent hospitalization for SOB where she was found to have acute bilateral cerebellar stroke, atrial fibrillation with RVR.   ddx includes migraine vs tension type headache vs ICH vs dissection.  Red flags include older age, no h/o migraines in the past, anticoagulant use, h/o stroke. No trauma. No neuro deficits on exam.   Will obtain labs, CTA head neck for further evaluation. Reglan, tylenol given.   ER work up reviewed by me and radiologist. CTA without acute abnormalities.  Pt given 2 migraine cocktails without improvement in HA. No neuro deficits on repeat neuro exam.  Patient requested to speak to neurologist. I contacted Dr Cheral Marker who was agreeable to come patient but  it may be several hours due to multiple neurological consults in ER concurrently.  I explained this to patient and husband, but patient ultimately did not want to wait any longer in ER.    Given benign work up, will dc with tylenol for HA. She has neuro f/u in one week. Discussed return precautions. Pt and husband in agreement. Shared with EDP Final Clinical Impressions(s) / ED Diagnoses   Final diagnoses:  Left-sided headache  History of stroke    ED Discharge Orders    None       Arlean Hopping 11/17/18 1910    Blanchie Dessert, MD 11/17/18 2056

## 2018-11-17 NOTE — Discharge Instructions (Addendum)
You are seen in the ER for left-sided headache.  CT angiography of your head and neck did not show any new abnormalities.  Truthfully, the cause of your headache is unclear.  This may be related to muscle tension.  I recommend 500 to 1000 mg of acetaminophen every 6-8 hours for headache.  You can apply heat to the area which may help as well.  Follow-up with your primary care doctor in 48 to 72 hours if you have persistent headache despite Tylenol.  Go to your neurology appointment next week for further discussion if you continue to have intermittent headaches.  They may discuss more headache medicines.  Return to the ER for sudden, severe or new headache, stroke symptoms

## 2018-11-18 ENCOUNTER — Encounter: Payer: Self-pay | Admitting: Internal Medicine

## 2018-11-18 ENCOUNTER — Ambulatory Visit: Payer: Medicare Other | Admitting: Internal Medicine

## 2018-11-18 ENCOUNTER — Other Ambulatory Visit: Payer: Self-pay | Admitting: Endocrinology

## 2018-11-18 ENCOUNTER — Ambulatory Visit (INDEPENDENT_AMBULATORY_CARE_PROVIDER_SITE_OTHER): Payer: Medicare Other | Admitting: Internal Medicine

## 2018-11-18 ENCOUNTER — Telehealth: Payer: Self-pay | Admitting: Family Medicine

## 2018-11-18 ENCOUNTER — Other Ambulatory Visit: Payer: Self-pay | Admitting: *Deleted

## 2018-11-18 VITALS — BP 124/79 | HR 85 | Temp 98.5°F

## 2018-11-18 DIAGNOSIS — R51 Headache: Secondary | ICD-10-CM

## 2018-11-18 DIAGNOSIS — B955 Unspecified streptococcus as the cause of diseases classified elsewhere: Secondary | ICD-10-CM

## 2018-11-18 DIAGNOSIS — I69898 Other sequelae of other cerebrovascular disease: Secondary | ICD-10-CM | POA: Diagnosis not present

## 2018-11-18 DIAGNOSIS — R519 Headache, unspecified: Secondary | ICD-10-CM

## 2018-11-18 DIAGNOSIS — E118 Type 2 diabetes mellitus with unspecified complications: Secondary | ICD-10-CM

## 2018-11-18 DIAGNOSIS — I330 Acute and subacute infective endocarditis: Secondary | ICD-10-CM | POA: Diagnosis not present

## 2018-11-18 DIAGNOSIS — E0865 Diabetes mellitus due to underlying condition with hyperglycemia: Secondary | ICD-10-CM

## 2018-11-18 DIAGNOSIS — H2513 Age-related nuclear cataract, bilateral: Secondary | ICD-10-CM | POA: Diagnosis not present

## 2018-11-18 DIAGNOSIS — I639 Cerebral infarction, unspecified: Secondary | ICD-10-CM | POA: Diagnosis not present

## 2018-11-18 DIAGNOSIS — H34212 Partial retinal artery occlusion, left eye: Secondary | ICD-10-CM | POA: Diagnosis not present

## 2018-11-18 LAB — GLUCOSE, POCT (MANUAL RESULT ENTRY): POC Glucose: 152 mg/dl — AB (ref 70–99)

## 2018-11-18 MED ORDER — IBUPROFEN 200 MG PO TABS
600.0000 mg | ORAL_TABLET | Freq: Once | ORAL | Status: AC
  Administered 2018-11-18: 17:00:00 600 mg via ORAL

## 2018-11-18 NOTE — Telephone Encounter (Signed)
I can not see them today but we can her tomorrow

## 2018-11-18 NOTE — Telephone Encounter (Signed)
Copied from [place] [phone]. Topic: Appointment Scheduling - Scheduling Inquiry for Clinic >> Nov 18, 2018  4:04 PM Yvette Rack wrote: Reason for CRM: Pt husband [name] stated pt has been experiencing headaches in the back of her head and he would like Tommi Rumps to see pt today if possible. [name] stated pt had a stroke a few weeks ago and he would like Cory to squeeze them in today. [name] requests that Tommi Rumps calls him back at [phone]

## 2018-11-18 NOTE — Progress Notes (Signed)
Per verbal order from Dr Baxter Flattery, 46 cm Double Lumen Peripherally Inserted Central Catheter removed from right basilic, tip intact. No sutures present. Length per chart was 41 cm. Dressing was clean and dry. Petroleum dressing applied. Pt advised no heavy lifting with this arm, leave dressing for 24 hours and call the office or seek emergent care if dressing becomes soaked with blood or swelling or sharp pain presents. Patient's husband verbalized understanding and agreement.  Patient's questions answered to their satisfaction.   Patient experiencing headache during visit, was given 600 mg ibuprofen per verbal order from Dr Baxter Flattery.  Patient experiencing some dizziness when standing to leave.  STAT blood glucose checked (152), but patient had not eaten today (had multiple doctor visits). Blood pressure 124/79 upon recheck. Patient given applesauce per Dr Baxter Flattery, reported feeling much better 15 minutes later.  RN rolled patient in wheelchair to her car.  Mary at [facility] notified.  Landis Gandy, RN

## 2018-11-18 NOTE — Patient Instructions (Signed)
For your headache over the next 1-2 days, can do a trial of taking naproxen 561m every 12 hrs. Can also take tylenol 6554mevery 6 hrs if needed over next few days.  If no relief overnight, recommend to see pcp in the morning for evaluation

## 2018-11-18 NOTE — Patient Outreach (Signed)
[facility]) Care Management  11/18/2018  Breanna Webster April 22, 1946 [id number]    Follow up ost ED visit 7/27  RN spoke briefly with pt's spouse [name]) who indicated they were at the provider's office at this time and requested a call back tomorrow.   RN will follow up tomorrow afternoon as requested.  [name], RN Care Management Coordinator [place] Office [phone]

## 2018-11-18 NOTE — Progress Notes (Signed)
RFV: follow up for streptococcal endocarditis c/b stroke  Patient ID: Breanna Webster, female   DOB: 26-Jul-1945, 73 y.o.   MRN: [id number]  HPI Breanna Webster is a 73yo F with DM, in addition to hx of having TAVR and was admitted recently with strep sella various bacteremia with  TEE revealing native mitral valve endocarditis but no infection on her prosthetic aortic valve.  She was discharged on IV ceftriaxone and gentamicin.  She completed 2 weeks of gentamicin without difficultying however roughly 14d into treatment course she developed complications of endocarditis with new onset left sided weakness,and admitted due to embolic cerebellar stroke.  TTE was repeated but was of limited quality.  No comment was made about vegetations or new valvular dysfunction  Brain MRI confirmed bilateral embolic strokes related to her known endocarditis and/for clot related to her atrial fibrillation.  She completed high-dose ceftriaxone at least through today. She states that she started to have New onset, headache, with associated left sided Scalp tenderness located to parietal region. Sharp onset- went to ED yesterday and repeat imaging did not show new hemorrhage, given tylenol with some limited improvement, she states it went away when she went to bed however has not recurred today and not taken any medication.  She had ophtho exam with dr grout today who did dilated exam. She denies any visual field cuts but mentioned that the stroke has affected her vision.  Plan for her today was to remove her picc line since she has completed course of therapy, however, she visibly discomfort due to headache, and felt unwell when trying to move from sitting to standing to wheelchair Outpatient Encounter Medications as of 11/18/2018  Medication Sig  . apixaban (ELIQUIS) 5 MG TABS tablet Take 1 tablet (5 mg total) by mouth 2 (two) times daily.  Marland Kitchen aspirin 81 MG tablet Take 81 mg by mouth daily.    . chlorpheniramine  (CHLOR-TRIMETON) 4 MG tablet Take 4 mg by mouth at bedtime.   . cholecalciferol (VITAMIN D) 1000 units tablet Take 1,000 Units by mouth daily.  Marland Kitchen diltiazem (CARDIZEM CD) 120 MG 24 hr capsule Take 1 capsule (120 mg total) by mouth daily.  Marland Kitchen escitalopram (LEXAPRO) 20 MG tablet Take 1 tablet (20 mg total) by mouth daily.  . hydrochlorothiazide (HYDRODIURIL) 12.5 MG tablet Take 1 tablet (12.5 mg total) by mouth daily.  . insulin NPH Human (NOVOLIN N RELION) 100 UNIT/ML injection Inject 0.4 mLs (40 Units total) into the skin every morning. And syringes 1/day  . Insulin Syringe-Needle U-100 (BD INSULIN SYRINGE U/F) 31G X 5/16" 1 ML MISC USE TO INJECT INSULIN ONCE DAILY  . levothyroxine (SYNTHROID) 75 MCG tablet Take 1 tablet (75 mcg total) by mouth daily.  Marland Kitchen losartan (COZAAR) 50 MG tablet TAKE 1 TABLET(50 MG) BY MOUTH DAILY (Patient taking differently: Take 50 mg by mouth daily. )  . Multiple Vitamin (MULITIVITAMIN WITH MINERALS) TABS Take 1 tablet by mouth daily.  . nitroGLYCERIN (NITROSTAT) 0.4 MG SL tablet Place 1 tablet (0.4 mg total) under the tongue every 5 (five) minutes as needed for chest pain.  . nortriptyline (PAMELOR) 25 MG capsule TAKE 1 CAPSULE(25 MG) BY MOUTH AT BEDTIME (Patient taking differently: Take 25 mg by mouth at bedtime. )  . pantoprazole (PROTONIX) 40 MG tablet Take 1 tablet (40 mg total) by mouth 2 (two) times daily.   No facility-administered encounter medications on file as of 11/18/2018.      Patient Active Problem List   Diagnosis Date  Noted  . Atrial fibrillation with RVR ([place]) 10/21/2018  . Cerebellar stroke, acute ([place]) 10/21/2018  . Streptococcal endocarditis   . Endocarditis of mitral valve 10/07/2018  . Bacteremia due to Streptococcus Salivarius 10/07/2018  . Sepsis ([place]) 10/04/2018  . Acute metabolic encephalopathy 31/54/0086  . Severe aortic stenosis 07/08/2018  . S/P TAVR (transcatheter aortic valve replacement) 07/08/2018  . Esophageal thickening   . CAD  in native artery 04/22/2018  . Gastroesophageal reflux disease   . Pulmonary hypertension ([place]) 02/21/2018  . Essential hypertension 07/15/2017  . History of colonic polyps 05/22/2017  . Elevated liver function tests 12/05/2016  . Thalassemia minor 05/29/2016  . Left bundle branch block 12/06/2015  . Upper airway cough syndrome 10/14/2015  . Myalgia 02/17/2014  . Carotid artery stenosis 06/05/2013  . Eustachian tube dysfunction 05/07/2013  . Neuropathy of leg 03/07/2012  . Anemia, unspecified 01/31/2012  . Hot flashes 08/24/2010  . Diabetes mellitus due to underlying condition, uncontrolled ([place]) 06/30/2010  . Obesity 12/21/2009  . Vitamin D deficiency 03/10/2009  . Hypothyroidism 12/13/2008  . Dyslipidemia 12/13/2008  . Anxiety state 12/13/2008  . Other specified disorders of bladder 12/13/2008     Health Maintenance Due  Topic Date Due  . OPHTHALMOLOGY EXAM  02/15/2018  . COLONOSCOPY  03/20/2018     Review of Systems 12 point ros is negative except what is mentioned in hpi. She reports that she had not had anything to eat besides breakfast Physical Exam   BP 124/79   Pulse 85   Temp 98.5 F (36.9 C) (Oral)   SpO2 98%  Physical Exam  Constitutional:  oriented to person, place, and time. appears well-developed and well-nourished. In mild distress with sharp headache HENT: [place]/AT, PERRLA, no scleral icterus Mouth/Throat: Oropharynx is clear and moist. No oropharyngeal exudate.  Cardiovascular: Normal rate, regular rhythm and normal heart sounds. Exam reveals no gallop and no friction rub.  No murmur heard.  Pulmonary/Chest: Effort normal and breath sounds normal. No respiratory distress.  has no wheezes.  Neck = supple, no nuchal rigidity Abdominal: Soft. Bowel sounds are normal.  exhibits no distension. There is no tenderness.  Lymphadenopathy: no cervical adenopathy. No axillary adenopathy Neurological: alert and oriented to person, place, and time.  Skin: Skin is  warm and dry. No rash noted. No erythema.  Psychiatric: a normal mood and affect.  behavior is normal.   Lab Results  Component Value Date   HEPBSAB NEG 06/02/2015   No results found for: RPR, LABRPR  CBC Lab Results  Component Value Date   WBC 5.1 11/17/2018   RBC 5.54 (H) 11/17/2018   HGB 10.4 (L) 11/17/2018   HCT 34.4 (L) 11/17/2018   PLT 178 11/17/2018   MCV 62.1 (L) 11/17/2018   MCH 18.8 (L) 11/17/2018   MCHC 30.2 11/17/2018   RDW 15.7 (H) 11/17/2018   LYMPHSABS 0.6 (L) 11/17/2018   MONOABS 0.4 11/17/2018   EOSABS 0.0 11/17/2018    BMET Lab Results  Component Value Date   NA 136 11/17/2018   K 3.9 11/17/2018   CL 99 11/17/2018   CO2 24 11/17/2018   GLUCOSE 249 (H) 11/17/2018   BUN 12 11/17/2018   CREATININE 0.69 11/17/2018   CALCIUM 10.4 (H) 11/17/2018   GFRNONAA >60 11/17/2018   GFRAA >60 11/17/2018      Assessment and Plan  - headache, scalp tenderness to apex of head = may/may not related ? To recent endocarditis. She went to ED yesterday  Which ruled  out any new ICH. Will Give naproxen/ibuprofen to see if improved. Gave ibuprofen 644m x 1 in clinic. Recommended naproxen 500 bid on full stomach then follow up with pcp today/tomrrow  Is this entrapted nerve?   - will check sed rate and crp.   Endocarditis = recommend repeat TTE through dr sTamala Julian She has follow up in august per her report. Pulling out line today since she has finished therapy  Dizziness = we checked her BS which was in the 150s , repeat BP was WNL. Gave food and fluids and ibuprofen dose which seem to improve her symptoms temporarily  Gave advice to her and her husband  if worsens, intractactable tonite, to come back to ED for evaluation.

## 2018-11-18 NOTE — Telephone Encounter (Signed)
Spoke to [employer].  He was driving.  Has taken Aleesa to see a different doctor who has gave her medication for her headache.  Will check on Christella in the morning.  Informed [name] that I will call between 8 AM and 9AM.

## 2018-11-19 ENCOUNTER — Telehealth (INDEPENDENT_AMBULATORY_CARE_PROVIDER_SITE_OTHER): Payer: Medicare Other | Admitting: Adult Health

## 2018-11-19 ENCOUNTER — Other Ambulatory Visit: Payer: Self-pay

## 2018-11-19 ENCOUNTER — Other Ambulatory Visit: Payer: Self-pay | Admitting: Adult Health

## 2018-11-19 ENCOUNTER — Other Ambulatory Visit: Payer: Self-pay | Admitting: *Deleted

## 2018-11-19 DIAGNOSIS — Z760 Encounter for issue of repeat prescription: Secondary | ICD-10-CM

## 2018-11-19 DIAGNOSIS — R519 Headache, unspecified: Secondary | ICD-10-CM

## 2018-11-19 DIAGNOSIS — I639 Cerebral infarction, unspecified: Secondary | ICD-10-CM

## 2018-11-19 DIAGNOSIS — R51 Headache: Secondary | ICD-10-CM

## 2018-11-19 LAB — C-REACTIVE PROTEIN: CRP: 7.3 mg/L (ref <8.0)

## 2018-11-19 LAB — SEDIMENTATION RATE: Sed Rate: 25 mm/h (ref 0–30)

## 2018-11-19 MED ORDER — APIXABAN 5 MG PO TABS: 5.0000 mg | ORAL_TABLET | Freq: Two times a day (BID) | ORAL | 0 refills | Status: DC

## 2018-11-19 MED ORDER — GABAPENTIN 300 MG PO CAPS: 300.0000 mg | ORAL_CAPSULE | Freq: Three times a day (TID) | ORAL | 0 refills | Status: DC

## 2018-11-19 MED ORDER — DILTIAZEM HCL ER COATED BEADS 120 MG PO CP24: 120.0000 mg | ORAL_CAPSULE | Freq: Every day | ORAL | 0 refills | Status: DC

## 2018-11-19 NOTE — Telephone Encounter (Signed)
Spoke to [employer]. Headache continues.  She had naproxen at 10 PM last night before bed.  Pt paced the house last night.  Has not rested.  Had Advil at 3 AM.  States the headache is always in the same place.  Would like to know if she needs to be seen or can [facility] prescribe medication.  Please advise.

## 2018-11-19 NOTE — Progress Notes (Signed)
Virtual Visit via Video Note  I connected with Breanna Webster on 11/19/18 at  4:30 PM EDT by a video enabled telemedicine application and verified that I am speaking with the correct person using two identifiers.  Location patient: home Location provider:work or home office Persons participating in the virtual visit: patient, provider, spouse [name])  I discussed the limitations of evaluation and management by telemedicine and the availability of in person appointments. The patient expressed understanding and agreed to proceed.   HPI:  73 year old female who is being evaluated today for left-sided headache.  She is status post TAVR in March 2020, mitral valve endocarditis in which she just finished gentamicin/Rocephin via PICC line and recent cerebellarembolic stroke.  She presented to the emergency room 2 days ago with the complaint of the sudden onset when she woke up in the morning left-sided headache.  Pain was described as "squeezing" and was localized to the left parietal and occipital area and at the base of the left neck.  Her pain at this time was constant.  She did report associated sensitivity to light.  Pain was worse when she lays flat or when she tries to move her neck.  She did not mention any alleviating factors.  In the ER her work-up was unremarkable.  She had a CTA head and neck without any acute abnormalities.  She was given 2 migraine cocktails without improvement in her headache symptoms.  She was discharged with Tylenol  Today she reports that her headache has not changed.  It continues to be a constant squeezing" on the left parietal, occipital, and at the base of the neck.  She reports that her headache does not allow her to sleep and that she has been up about 48 hours due to the pain.  He does endorse continued sensitivity to light.  Is being discharged from the emergency room she has tried Tylenol without any relief, yesterday evening she took naproxen and also has  tried Aleve without improvement.  She is on Eliquis.  She was seen by her eye doctor Dr. Katy Fitch to which they report he had some concern of occipital neuralgia.She does endorse   She has follow-up appointment with neurology in mid August  ROS: See pertinent positives and negatives per HPI.  Past Medical History:  Diagnosis Date  . Arthritis    "back" (04/22/2018)  . CAD (coronary artery disease)    a. 03/2018 s/p PCI/DES to the RCA (3.0x15 Onyx DES).  . Carotid artery stenosis    Mild  . Chronic lower back pain   . Colon polyps   . Diverticulitis   . Diverticulosis   . Esophageal thickening    seen on pre TAVR CT scan, also questionable cirrhosis. MRI recommended. Will refer to GI  . GERD (gastroesophageal reflux disease)   . Grave's disease   . History of colonic polyps 05/22/2017  . History of hiatal hernia   . Hypertension   . Hypothyroidism   . IBS (irritable bowel syndrome)   . Osteopenia   . Pulmonary nodules    seen on pre TAVR CT. likley benign. no follow up recommended if pt low risk.  . S/P TAVR (transcatheter aortic valve replacement)   . Severe aortic stenosis   . Thalassemia minor   . Type II diabetes mellitus ([place])     Past Surgical History:  Procedure Laterality Date  . [place] STUDY N/A 03/03/2018   Procedure: [place] STUDY;  Surgeon: Mauri Pole,  MD;  Location: [facility];  Service: Endoscopy;  Laterality: N/A;  . COLONOSCOPY W/ BIOPSIES AND POLYPECTOMY    . CORONARY ANGIOGRAPHY Right 04/21/2018   Procedure: CORONARY ANGIOGRAPHY (CATH LAB);  Surgeon: [name], MD;  Location: [place] CV LAB;  Service: Cardiovascular;  Laterality: Right;  . CORONARY STENT INTERVENTION N/A 04/22/2018   Procedure: CORONARY STENT INTERVENTION;  Surgeon: [name], MD;  Location: [place] CV LAB;  Service: Cardiovascular;  Laterality: N/A;  . DILATION AND CURETTAGE OF UTERUS    . ESOPHAGEAL MANOMETRY N/A 03/03/2018   Procedure: ESOPHAGEAL  MANOMETRY (EM);  Surgeon: Mauri Pole, MD;  Location: [facility]OS[facility];  Service: Endoscopy;  Laterality: N/A;  . HERNIA REPAIR    . HYSTEROSCOPY     fibroids  . LAPAROSCOPIC CHOLECYSTECTOMY    . LAPAROSCOPY     fibroids  . NISSEN FUNDOPLICATION  8119J  . RIGHT/LEFT HEART CATH AND CORONARY ANGIOGRAPHY N/A 02/20/2018   Procedure: RIGHT/LEFT HEART CATH AND CORONARY ANGIOGRAPHY;  Surgeon: [name], MD;  Location: [place] CV LAB;  Service: Cardiovascular;  Laterality: N/A;  . TEE WITHOUT CARDIOVERSION N/A 07/08/2018   Procedure: TRANSESOPHAGEAL ECHOCARDIOGRAM (TEE);  Surgeon: Burnell Blanks, MD;  Location: [place];  Service: Open Heart Surgery;  Laterality: N/A;  . TEE WITHOUT CARDIOVERSION  10/07/2018  . TEE WITHOUT CARDIOVERSION N/A 10/07/2018   Procedure: TRANSESOPHAGEAL ECHOCARDIOGRAM (TEE);  Surgeon: Jerline Pain, MD;  [facility];  Service: Cardiovascular;  Laterality: N/A;  . TONSILLECTOMY    . TRANSCATHETER AORTIC VALVE REPLACEMENT, TRANSFEMORAL N/A 07/08/2018   Procedure: TRANSCATHETER AORTIC VALVE REPLACEMENT, TRANSFEMORAL;  Surgeon: Burnell Blanks, MD;  Location: [place];  Service: Open Heart Surgery;  Laterality: N/A;    Family History  Adopted: Yes  Problem Relation Age of Onset  . Healthy Son        x 2  . Headache Other        Cluster headaches  . Heart failure Mother   . Colon cancer Neg Hx   . Pancreatic cancer Neg Hx   . Rectal cancer Neg Hx   . Stomach cancer Neg Hx       Current Outpatient Medications:  .  apixaban (ELIQUIS) 5 MG TABS tablet, Take 1 tablet (5 mg total) by mouth 2 (two) times daily., Disp: 60 tablet, Rfl: 0 .  aspirin 81 MG tablet, Take 81 mg by mouth daily.  , Disp: , Rfl:  .  BD INSULIN SYRINGE U/F 31G X 5/16" 1 ML MISC, USE TO INJECT INSULIN EVERY DAY, Disp: 100 each, Rfl: 3 .  chlorpheniramine (CHLOR-TRIMETON) 4 MG tablet, Take 4 mg by mouth at bedtime. , Disp: , Rfl:  .  cholecalciferol (VITAMIN D)  1000 units tablet, Take 1,000 Units by mouth daily., Disp: , Rfl:  .  diltiazem (CARDIZEM CD) 120 MG 24 hr capsule, Take 1 capsule (120 mg total) by mouth daily., Disp: 30 capsule, Rfl: 0 .  escitalopram (LEXAPRO) 20 MG tablet, Take 1 tablet (20 mg total) by mouth daily., Disp: 90 tablet, Rfl: 0 .  hydrochlorothiazide (HYDRODIURIL) 12.5 MG tablet, Take 1 tablet (12.5 mg total) by mouth daily., Disp: 90 tablet, Rfl: 0 .  insulin NPH Human (NOVOLIN N RELION) 100 UNIT/ML injection, Inject 0.4 mLs (40 Units total) into the skin every morning. And syringes 1/day, Disp: 30 mL, Rfl: 0 .  levothyroxine (SYNTHROID) 75 MCG tablet, Take 1 tablet (75 mcg total) by mouth daily., Disp: 30 tablet,  Rfl: 0 .  losartan (COZAAR) 50 MG tablet, TAKE 1 TABLET(50 MG) BY MOUTH DAILY (Patient taking differently: Take 50 mg by mouth daily. ), Disp: 90 tablet, Rfl: 0 .  Melatonin 5 MG TABS, Take by mouth., Disp: , Rfl:  .  Multiple Vitamin (MULITIVITAMIN WITH MINERALS) TABS, Take 1 tablet by mouth daily., Disp: , Rfl:  .  nitroGLYCERIN (NITROSTAT) 0.4 MG SL tablet, Place 1 tablet (0.4 mg total) under the tongue every 5 (five) minutes as needed for chest pain., Disp: 25 tablet, Rfl: 3 .  nortriptyline (PAMELOR) 25 MG capsule, TAKE 1 CAPSULE(25 MG) BY MOUTH AT BEDTIME (Patient taking differently: Take 25 mg by mouth at bedtime. ), Disp: 90 capsule, Rfl: 0 .  pantoprazole (PROTONIX) 40 MG tablet, Take 1 tablet (40 mg total) by mouth 2 (two) times daily., Disp: 60 tablet, Rfl: 5  EXAM:  VITALS per patient if applicable:  GENERAL: alert, oriented, appears well and in no acute distress  HEENT: atraumatic, conjunttiva clear, no obvious abnormalities on inspection of external nose and ears  NECK: normal movements of the head and neck  LUNGS: on inspection no signs of respiratory distress, breathing rate appears normal, no obvious gross SOB, gasping or wheezing  CV: no obvious cyanosis  MS: moves all visible extremities  without noticeable abnormality  PSYCH/NEURO: pleasant and cooperative, no obvious depression or anxiety, speech and thought processing grossly intact  ASSESSMENT AND PLAN:  Discussed the following assessment and plan: 1. Acute intractable headache, unspecified headache type -Probable post stroke headache -Advised against using any anti-inflammatories with Eliquis.  She is already taking nortriptyline 25 mg at bedtime, in the past she has increased this to 50 mg but "did not notice any difference with this medication".  We will trial her on low-dose gabapentin to see if this helps with headache.  We will follow-up with her tomorrow - gabapentin (NEURONTIN) 300 MG capsule; Take 1 capsule (300 mg total) by mouth 3 (three) times daily for 10 days.  Dispense: 30 capsule; Refill: 0  2. Medication refill  - apixaban (ELIQUIS) 5 MG TABS tablet; Take 1 tablet (5 mg total) by mouth 2 (two) times daily.  Dispense: 60 tablet; Refill: 0 - diltiazem (CARDIZEM CD) 120 MG 24 hr capsule; Take 1 capsule (120 mg total) by mouth daily.  Dispense: 30 capsule; Refill: 0   I discussed the assessment and treatment plan with the patient. The patient was provided an opportunity to ask questions and all were answered. The patient agreed with the plan and demonstrated an understanding of the instructions.   The patient was advised to call back or seek an in-person evaluation if the symptoms worsen or if the condition fails to improve as anticipated.   [name], NP

## 2018-11-19 NOTE — Telephone Encounter (Signed)
Spoke to [employer] and advised she should only be taking Tylenol for headache at this time due to taking Eliquis.  Doxy visit set up for 4:30.  Nothing further needed.

## 2018-11-19 NOTE — Patient Outreach (Signed)
[facility]) Care Management  11/19/2018  JAMIE HAFFORD 03-09-1946 [id number]    2nd attempt to reach pt due to a recent ED visit on 7/27 however unsuccessful. Will continue to follow up accordingly with ongoing case management services regarding pt's plan of care and recent ED visit.   Will continue to follow up accordingly for ongoing [facility] services.  [name], RN Care Management Coordinator [place] Office [phone]

## 2018-11-19 NOTE — Telephone Encounter (Signed)
Since she is taking Eliquis she should not take any Nsaids ( Aleve, Naproxenm Advil, etc). She can take tylenol.   We can start with a virtual visit at 4:30 today

## 2018-11-20 ENCOUNTER — Ambulatory Visit (INDEPENDENT_AMBULATORY_CARE_PROVIDER_SITE_OTHER): Payer: Medicare Other | Admitting: Endocrinology

## 2018-11-20 ENCOUNTER — Other Ambulatory Visit: Payer: Self-pay

## 2018-11-20 ENCOUNTER — Encounter: Payer: Self-pay | Admitting: Endocrinology

## 2018-11-20 VITALS — BP 118/72 | HR 94 | Ht 65.0 in | Wt 189.0 lb

## 2018-11-20 DIAGNOSIS — Z794 Long term (current) use of insulin: Secondary | ICD-10-CM | POA: Diagnosis not present

## 2018-11-20 DIAGNOSIS — E039 Hypothyroidism, unspecified: Secondary | ICD-10-CM | POA: Diagnosis not present

## 2018-11-20 DIAGNOSIS — E0865 Diabetes mellitus due to underlying condition with hyperglycemia: Secondary | ICD-10-CM

## 2018-11-20 DIAGNOSIS — E119 Type 2 diabetes mellitus without complications: Secondary | ICD-10-CM

## 2018-11-20 DIAGNOSIS — R609 Edema, unspecified: Secondary | ICD-10-CM | POA: Diagnosis not present

## 2018-11-20 MED ORDER — LEVOTHYROXINE SODIUM 88 MCG PO TABS: 88.0000 ug | ORAL_TABLET | Freq: Every day | ORAL | 5 refills | Status: DC

## 2018-11-20 NOTE — Patient Instructions (Addendum)
Please continue the same insulin I have sent a prescription to your pharmacy, to increase the levothyroxine.  check your blood sugar twice a day.  vary the time of day when you check, between before the 3 meals, and at bedtime.  also check if you have symptoms of your blood sugar being too high or too low.  please keep a record of the readings and bring it to your next appointment here (or you can bring the meter itself).  You can write it on any piece of paper.  please call us sooner if your blood sugar goes below 70, or if you have a lot of readings over 200. Please come back for a follow-up appointment in 1 month.

## 2018-11-20 NOTE — Progress Notes (Signed)
Subjective:    Patient ID: Breanna Webster, female    DOB: 1945/10/21, 73 y.o.   MRN: [id number]  HPI Pt returns for f/u of diabetes mellitus: DM type: Insulin-requiring type 2 Dx'ed: 2003.  Complications: CAD and CVA Therapy: insulin since 2017.     GDM: never.   DKA: never.  Severe hypoglycemia: never.  Pancreatitis: never.   Other: she stopped metformin-XR due to nausea and diarrhea; pioglitizone is precluded by edema.  she takes human insulin, due to cost; she declines multiple daily injections.  Interval history: She now takes NPH, 100 units QAM.  Pt says cbg varies from 95-138.  Synthroid was reduced in the hospital.   Past Medical History:  Diagnosis Date  . Arthritis    "back" (04/22/2018)  . CAD (coronary artery disease)    a. 03/2018 s/p PCI/DES to the RCA (3.0x15 Onyx DES).  . Carotid artery stenosis    Mild  . Chronic lower back pain   . Colon polyps   . Diverticulitis   . Diverticulosis   . Esophageal thickening    seen on pre TAVR CT scan, also questionable cirrhosis. MRI recommended. Will refer to GI  . GERD (gastroesophageal reflux disease)   . Grave's disease   . History of colonic polyps 05/22/2017  . History of hiatal hernia   . Hypertension   . Hypothyroidism   . IBS (irritable bowel syndrome)   . Osteopenia   . Pulmonary nodules    seen on pre TAVR CT. likley benign. no follow up recommended if pt low risk.  . S/P TAVR (transcatheter aortic valve replacement)   . Severe aortic stenosis   . Thalassemia minor   . Type II diabetes mellitus ([place])     Past Surgical History:  Procedure Laterality Date  . [place] STUDY N/A 03/03/2018   Procedure: [place] STUDY;  Surgeon: Mauri Pole, MD;  Location: [facility];  Service: Endoscopy;  Laterality: N/A;  . COLONOSCOPY W/ BIOPSIES AND POLYPECTOMY    . CORONARY ANGIOGRAPHY Right 04/21/2018   Procedure: CORONARY ANGIOGRAPHY (CATH LAB);  Surgeon: [name], MD;  Location: [facility];  Service: Cardiovascular;  Laterality: Right;  . CORONARY STENT INTERVENTION N/A 04/22/2018   Procedure: CORONARY STENT INTERVENTION;  Surgeon: [name], MD;  Location: [place] [facility];  Service: Cardiovascular;  Laterality: N/A;  . DILATION AND CURETTAGE OF UTERUS    . ESOPHAGEAL MANOMETRY N/A 03/03/2018   Procedure: ESOPHAGEAL MANOMETRY (EM);  Surgeon: Mauri Pole, MD;  Location: [facility];  Service: Endoscopy;  Laterality: N/A;  . HERNIA REPAIR    . HYSTEROSCOPY     fibroids  . LAPAROSCOPIC CHOLECYSTECTOMY    . LAPAROSCOPY     fibroids  . NISSEN FUNDOPLICATION  7588T  . RIGHT/LEFT HEART CATH AND CORONARY ANGIOGRAPHY N/A 02/20/2018   Procedure: RIGHT/LEFT HEART CATH AND CORONARY ANGIOGRAPHY;  Surgeon: [name], MD;  Location: [facility];  Service: Cardiovascular;  Laterality: N/A;  . TEE WITHOUT CARDIOVERSION N/A 07/08/2018   Procedure: TRANSESOPHAGEAL ECHOCARDIOGRAM (TEE);  Surgeon: Burnell Blanks, MD;  Location: [facility];  Service: Open Heart Surgery;  Laterality: N/A;  . TEE WITHOUT CARDIOVERSION  10/07/2018  . TEE WITHOUT CARDIOVERSION N/A 10/07/2018   Procedure: TRANSESOPHAGEAL ECHOCARDIOGRAM (TEE);  Surgeon: Jerline Pain, MD;  Location: [facility];  Service: Cardiovascular;  Laterality: N/A;  . TONSILLECTOMY    . TRANSCATHETER AORTIC VALVE REPLACEMENT, TRANSFEMORAL N/A 07/08/2018  Procedure: TRANSCATHETER AORTIC VALVE REPLACEMENT, TRANSFEMORAL;  Surgeon: Burnell Blanks, MD;  Location: [facility];  Service: Open Heart Surgery;  Laterality: N/A;    Social History   Socioeconomic History  . Marital status: Married    Spouse name: Not on file  . Number of children: 2  . Years of education: Not on file  . Highest education level: Not on file  Occupational History  . Occupation: Tree surgeon of the [employer]  . Financial resource strain: Not hard at all  . Food insecurity    Worry: Not on file     Inability: Not on file  . Transportation needs    Medical: No    Non-medical: No  Tobacco Use  . Smoking status: Never Smoker  . Smokeless tobacco: Never Used  Substance and Sexual Activity  . Alcohol use: No    Alcohol/week: 0.0 standard drinks  . Drug use: Never  . Sexual activity: Not Currently  Lifestyle  . Physical activity    Days per week: Not on file    Minutes per session: Not on file  . Stress: To some extent  Relationships  . Social Herbalist on phone: Not on file    Gets together: Not on file    Attends religious service: Not on file    Active member of club or organization: Not on file    Attends meetings of clubs or organizations: Not on file    Relationship status: Not on file  . Intimate partner violence    Fear of current or ex partner: Not on file    Emotionally abused: Not on file    Physically abused: Not on file    Forced sexual activity: Not on file  Other Topics Concern  . Not on file  Social History Narrative   Lives with husband in a one story home.     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   Education: college.    Current Outpatient Medications on File Prior to Visit  Medication Sig Dispense Refill  . apixaban (ELIQUIS) 5 MG TABS tablet Take 1 tablet (5 mg total) by mouth 2 (two) times daily. 60 tablet 0  . aspirin 81 MG tablet Take 81 mg by mouth daily.      . BD INSULIN SYRINGE U/F 31G X 5/16" 1 ML MISC USE TO INJECT INSULIN EVERY DAY 100 each 3  . chlorpheniramine (CHLOR-TRIMETON) 4 MG tablet Take 4 mg by mouth at bedtime.     . cholecalciferol (VITAMIN D) 1000 units tablet Take 1,000 Units by mouth daily.    Marland Kitchen diltiazem (CARDIZEM CD) 120 MG 24 hr capsule Take 1 capsule (120 mg total) by mouth daily. 30 capsule 0  . escitalopram (LEXAPRO) 20 MG tablet Take 1 tablet (20 mg total) by mouth daily. 90 tablet 0  . gabapentin (NEURONTIN) 300 MG capsule Take 1  capsule (300 mg total) by mouth 3 (three) times daily for 10 days. 30 capsule 0  . hydrochlorothiazide (HYDRODIURIL) 12.5 MG tablet Take 1 tablet (12.5 mg total) by mouth daily. 90 tablet 0  . insulin NPH Human (NOVOLIN N) 100 UNIT/ML injection Inject 100 Units into the skin daily before breakfast.    . losartan (COZAAR) 50 MG tablet TAKE 1 TABLET(50 MG) BY MOUTH DAILY (Patient taking differently: Take 50 mg by mouth daily. ) 90 tablet 0  . Melatonin 5  MG TABS Take by mouth.    . Multiple Vitamin (MULITIVITAMIN WITH MINERALS) TABS Take 1 tablet by mouth daily.    . nitroGLYCERIN (NITROSTAT) 0.4 MG SL tablet Place 1 tablet (0.4 mg total) under the tongue every 5 (five) minutes as needed for chest pain. 25 tablet 3  . nortriptyline (PAMELOR) 25 MG capsule TAKE 1 CAPSULE(25 MG) BY MOUTH AT BEDTIME (Patient taking differently: Take 25 mg by mouth at bedtime. ) 90 capsule 0  . pantoprazole (PROTONIX) 40 MG tablet Take 1 tablet (40 mg total) by mouth 2 (two) times daily. 60 tablet 5   No current facility-administered medications on file prior to visit.     Allergies  Allergen Reactions  . Statins Other (See Comments)    Muscle aches    Family History  Adopted: Yes  Problem Relation Age of Onset  . Healthy Son        x 2  . Headache Other        Cluster headaches  . Heart failure Mother   . Colon cancer Neg Hx   . Pancreatic cancer Neg Hx   . Rectal cancer Neg Hx   . Stomach cancer Neg Hx     BP 118/72 (BP Location: Left Arm, Patient Position: Sitting, Cuff Size: Large)   Pulse 94   Ht 5' 5"  (1.651 m)   Wt 189 lb (85.7 kg)   SpO2 95%   BMI 31.45 kg/m    Review of Systems She denies hypoglycemia.      Objective:   Physical Exam VITAL SIGNS:  See vs page GENERAL: no distress Pulses: dorsalis pedis intact bilat.   MSK: no deformity of the feet CV: trace bilat leg edema Skin:  no ulcer on the feet, but the skin is dry.  normal color and temp on the feet. Neuro: sensation is  intact to touch on the feet  Lab Results  Component Value Date   TSH 9.460 (H) 11/12/2018   T3TOTAL 89 02/26/2017     Lab Results  Component Value Date   HGBA1C 7.5 (H) 10/22/2018       Assessment & Plan:  Hypothyroidism: she needs increased rx Insulin-requiring type 2 DM: this is the best control this pt should aim for, given this regimen, which does match insulin to her changing needs throughout the day Edema: This limits rx options.   Patient Instructions  Please continue the same insulin I have sent a prescription to your pharmacy, to increase the levothyroxine.  check your blood sugar twice a day.  vary the time of day when you check, between before the 3 meals, and at bedtime.  also check if you have symptoms of your blood sugar being too high or too low.  please keep a record of the readings and bring it to your next appointment here (or you can bring the meter itself).  You can write it on any piece of paper.  please call us sooner if your blood sugar goes below 70, or if you have a lot of readings over 200. Please come back for a follow-up appointment in 1 month.

## 2018-11-21 ENCOUNTER — Other Ambulatory Visit: Payer: Self-pay | Admitting: Pharmacy Technician

## 2018-11-21 NOTE — Patient Outreach (Signed)
[facility]) Care Management  11/21/2018  ZYIONNA PESCE Jun 08, 1945 [id number]   Successful outreach call placed to patient in regards to [employer] application for Novolin N and BMS application for Eliquis.  Spoke to patient's husband, HIPAA identifiers verified.  Patient's husband informed they have received the applications and he informed he sent them back in this week.  Will followup with patient in 10-20 business days if application has not been received.  Hayle P. Eliabeth Shoff, [facility] [phone]

## 2018-11-25 ENCOUNTER — Ambulatory Visit: Payer: Self-pay | Admitting: Pharmacist

## 2018-11-26 ENCOUNTER — Telehealth: Payer: Self-pay | Admitting: Adult Health

## 2018-11-26 ENCOUNTER — Other Ambulatory Visit: Payer: Self-pay | Admitting: Adult Health

## 2018-11-26 DIAGNOSIS — R519 Headache, unspecified: Secondary | ICD-10-CM

## 2018-11-26 MED ORDER — AMITRIPTYLINE HCL 25 MG PO TABS: 25.0000 mg | ORAL_TABLET | Freq: Every day | ORAL | 0 refills | Status: DC

## 2018-11-26 NOTE — Telephone Encounter (Signed)
Called and spoke to the patient today about her continued left-sided headache status post stroke.  About a week and a half ago we trialed her on gabapentin 300 mg nightly.  She reports that unfortunately this medication did not work and she continues to have a constant left-sided headache.  She is taking nortriptyline nightly but does not get much relief from this.  We will DC gabapentin and nortriptyline and trial her on a low-dose of amitriptyline  Follow-up with her at the end of the week

## 2018-12-01 DIAGNOSIS — E1165 Type 2 diabetes mellitus with hyperglycemia: Secondary | ICD-10-CM | POA: Diagnosis not present

## 2018-12-01 DIAGNOSIS — I2510 Atherosclerotic heart disease of native coronary artery without angina pectoris: Secondary | ICD-10-CM | POA: Diagnosis not present

## 2018-12-01 DIAGNOSIS — E118 Type 2 diabetes mellitus with unspecified complications: Secondary | ICD-10-CM | POA: Diagnosis not present

## 2018-12-01 DIAGNOSIS — Z952 Presence of prosthetic heart valve: Secondary | ICD-10-CM | POA: Diagnosis not present

## 2018-12-01 DIAGNOSIS — I639 Cerebral infarction, unspecified: Secondary | ICD-10-CM | POA: Diagnosis not present

## 2018-12-01 DIAGNOSIS — I4891 Unspecified atrial fibrillation: Secondary | ICD-10-CM | POA: Diagnosis not present

## 2018-12-01 DIAGNOSIS — Z794 Long term (current) use of insulin: Secondary | ICD-10-CM | POA: Diagnosis not present

## 2018-12-01 DIAGNOSIS — E039 Hypothyroidism, unspecified: Secondary | ICD-10-CM | POA: Diagnosis not present

## 2018-12-01 DIAGNOSIS — Z6831 Body mass index (BMI) 31.0-31.9, adult: Secondary | ICD-10-CM | POA: Diagnosis not present

## 2018-12-01 LAB — TSH: TSH: 13.59 — AB (ref 0.41–5.90)

## 2018-12-01 LAB — BASIC METABOLIC PANEL
BUN: 18 (ref 4–21)
Creatinine: 1 (ref 0.5–1.1)
Glucose: 204
Potassium: 4.2 (ref 3.4–5.3)
Sodium: 139 (ref 137–147)

## 2018-12-01 LAB — HEPATIC FUNCTION PANEL
ALT: 69 — AB (ref 7–35)
AST: 60 — AB (ref 13–35)
Alkaline Phosphatase: 115 (ref 25–125)
Bilirubin, Total: 0.3

## 2018-12-02 ENCOUNTER — Ambulatory Visit: Payer: Medicare Other | Admitting: Nurse Practitioner

## 2018-12-02 ENCOUNTER — Ambulatory Visit (HOSPITAL_COMMUNITY): Payer: Medicare Other | Attending: Cardiology

## 2018-12-02 ENCOUNTER — Other Ambulatory Visit: Payer: Self-pay

## 2018-12-02 DIAGNOSIS — I330 Acute and subacute infective endocarditis: Secondary | ICD-10-CM | POA: Diagnosis not present

## 2018-12-03 ENCOUNTER — Other Ambulatory Visit: Payer: Self-pay | Admitting: Adult Health

## 2018-12-03 DIAGNOSIS — R519 Headache, unspecified: Secondary | ICD-10-CM

## 2018-12-03 NOTE — Telephone Encounter (Signed)
DENIED.  PT IS NO LONGER TAKING THIS MEDICATION.  MESSAGE SENT TO THE PHARMACY.

## 2018-12-09 ENCOUNTER — Other Ambulatory Visit: Payer: Self-pay | Admitting: Interventional Cardiology

## 2018-12-09 ENCOUNTER — Other Ambulatory Visit: Payer: Self-pay

## 2018-12-09 DIAGNOSIS — E118 Type 2 diabetes mellitus with unspecified complications: Secondary | ICD-10-CM

## 2018-12-09 DIAGNOSIS — I480 Paroxysmal atrial fibrillation: Secondary | ICD-10-CM

## 2018-12-09 DIAGNOSIS — I639 Cerebral infarction, unspecified: Secondary | ICD-10-CM

## 2018-12-09 MED ORDER — INSULIN NPH (HUMAN) (ISOPHANE) 100 UNIT/ML ~~LOC~~ SUSP: 100.0000 [IU] | Freq: Every day | SUBCUTANEOUS | 0 refills | Status: DC

## 2018-12-10 ENCOUNTER — Encounter: Payer: Self-pay | Admitting: Adult Health

## 2018-12-10 ENCOUNTER — Other Ambulatory Visit: Payer: Self-pay | Admitting: Adult Health

## 2018-12-10 ENCOUNTER — Other Ambulatory Visit: Payer: Self-pay

## 2018-12-10 ENCOUNTER — Ambulatory Visit (INDEPENDENT_AMBULATORY_CARE_PROVIDER_SITE_OTHER): Payer: Medicare Other | Admitting: Adult Health

## 2018-12-10 VITALS — BP 107/60 | HR 82 | Temp 97.3°F | Ht 65.0 in | Wt 193.6 lb

## 2018-12-10 DIAGNOSIS — I480 Paroxysmal atrial fibrillation: Secondary | ICD-10-CM | POA: Diagnosis not present

## 2018-12-10 DIAGNOSIS — G8194 Hemiplegia, unspecified affecting left nondominant side: Secondary | ICD-10-CM

## 2018-12-10 DIAGNOSIS — R471 Dysarthria and anarthria: Secondary | ICD-10-CM

## 2018-12-10 DIAGNOSIS — I10 Essential (primary) hypertension: Secondary | ICD-10-CM | POA: Diagnosis not present

## 2018-12-10 DIAGNOSIS — R519 Headache, unspecified: Secondary | ICD-10-CM

## 2018-12-10 DIAGNOSIS — I63423 Cerebral infarction due to embolism of bilateral anterior cerebral arteries: Secondary | ICD-10-CM

## 2018-12-10 DIAGNOSIS — R51 Headache: Secondary | ICD-10-CM

## 2018-12-10 DIAGNOSIS — R2689 Other abnormalities of gait and mobility: Secondary | ICD-10-CM | POA: Diagnosis not present

## 2018-12-10 DIAGNOSIS — E0865 Diabetes mellitus due to underlying condition with hyperglycemia: Secondary | ICD-10-CM

## 2018-12-10 DIAGNOSIS — R41841 Cognitive communication deficit: Secondary | ICD-10-CM | POA: Diagnosis not present

## 2018-12-10 DIAGNOSIS — I63433 Cerebral infarction due to embolism of bilateral posterior cerebral arteries: Secondary | ICD-10-CM | POA: Diagnosis not present

## 2018-12-10 DIAGNOSIS — I69398 Other sequelae of cerebral infarction: Secondary | ICD-10-CM

## 2018-12-10 MED ORDER — GABAPENTIN 300 MG PO CAPS: 300.0000 mg | ORAL_CAPSULE | Freq: Every day | ORAL | 3 refills | Status: DC

## 2018-12-10 NOTE — Patient Instructions (Signed)
Continue Eliquis (apixaban) daily for secondary stroke prevention  Continue aspirin 81 mg at this time but is not needed from a stroke standpoint for stroke prevention.  Follow-up with your cardiologist Dr. Tamala Julian for potential need of continuation from a cardiology standpoint  Continue to follow with Dr. Tamala Julian for ongoing atrial fibrillation and Eliquis management  Referral placed for physical, occupational and speech therapy -we will attempt to send to [facility] and if unable to provide therapy, please let us know and referral will be placed to [place] therapy.  Continue amitriptyline 25 mg nightly and gabapentin 300 mg nightly for headache management  Continue to follow with ophthalmology for ongoing monitoring and assessment of vision  Continue to follow up with PCP regarding blood pressure and diabetes management   Continue to monitor blood pressure at home  Maintain strict control of hypertension with blood pressure goal below 130/90, diabetes with hemoglobin A1c goal below 6.5% and cholesterol with LDL cholesterol (bad cholesterol) goal below 70 mg/dL. I also advised the patient to eat a healthy diet with plenty of whole grains, cereals, fruits and vegetables, exercise regularly and maintain ideal body weight.  Followup in the future with me in 3 months or call earlier if needed       Thank you for coming to see [name] at [facility]. I hope we have been able to provide you high quality care today.  You may receive a patient satisfaction survey over the next few weeks. We would appreciate your feedback and comments so that we may continue to improve ourselves and the health of our patients.

## 2018-12-10 NOTE — Progress Notes (Signed)
[facility] [address] [place] [address] [phone]       HOSPITAL FOLLOW UP NOTE  [name] Date of Birth:  Jan 02, 1946 Medical Record Number:  [id number]   Reason for Referral:  hospital stroke follow up    CHIEF COMPLAINT:  Chief Complaint  Patient presents with   Follow-up    Room , with husband Hosp. f/u. cerebellar stroke. "wobbly, at times incontetient, speech difficulty"     HPI: Breanna Webster being seen today for in office hospital follow-up regarding multifocal bilateral anterior and posterior infarcts secondary to new onset A. fib versus recent endocarditis on 10/21/2018.  History obtained from patient, husband and chart review. Reviewed all radiology images and labs personally.  Breanna Webster is a 73 y.o. female with history of DVT, aortic stenosis s/p TAVR in March 2020, hypothyroidism, pulmonary hypertension, HTN, CADs/p stenting December 2019 recently admitted for infective endocarditis on the mitral valve treated with 2 weeks gentamicin and ceftriaxone via PEG who presented on 10/21/2018 to the hospital with increased shortness of breath and confusion x2 weeks along with headache.  No focal neurologic symptoms.  New A. Fib with RVR found in the ED and started on heparin drip.  Neurology consulted with work-up revealing multifocal bilateral anterior and posterior infarcts cardioembolic pattern as evidenced on MRI secondary to new onset A. fib versus recent endocarditis.  MRI showed diffuse scattered subcentimeter foci of reduced diffusion and intermediate diffusion throughout the supratentorial brain, brainstem and cerebellum including multiple lesions within the corpus callosum.  Vessel imaging unremarkable with only mild arthrosclerotic bilateral ICA bifurcations.  2D echo normal EF without mention of vegetation.  Recommended initiating D [place] with new onset of A. fib.  HTN stable.  LDL 85 and due to history of statin allergy  with myalgias, statin initiation was not recommended.  A1c 7.5 and recommended follow-up with PCP for uncontrolled DM.  Other stroke risk factors include advanced age, obesity, CAD, MVR and status post TAVR 06/2018.  She was discharged home in stable condition without therapy.  She returned to ED on 11/17/2018 with complaints of sudden onset left-sided headache located left parietal occipital area at the base of her neck.  CTA unremarkable for dissection or other abnormality.  She did receive 2 migraine cocktails with improvement of headache symptoms.  She has since followed up with her PCP who initiated gabapentin along with continuation of nortriptyline 50 mg nightly.  She apparently did not gain benefit from gabapentin therefore recommended discontinuing gabapentin and nortriptyline by PCP and initiated on amitriptyline.  She did have follow-up with ophthalmologist with concerns of occipital neuralgia.   Residual deficits of mild left hemiparesis, mild left facial droop with occasional speech difficulty, balance difficulties, decreased vision and short-term memory concerns.  She does have difficulty ambulating at times stating her legs feel "rubbery" typically worse worsening in the morning and in the evening.  She is able to ambulate without assistive device.  Prior complaints of left-sided vision with sensation of a shade pulled over her left eye but this is since resolved and recently obtained new prescription glasses by ophthalmology.  She also endorses short-term memory concerns such as forgetting recent question/answer or her husband having to continuously repeat himself.  She denies improvement but her husband states he has seen improvement of her overall ambulation and memory concerns.  She has not received any therapy as this was not recommended at discharge.  She is questioning possible participation in  therapy. Previously experiencing left-sided severe headache with recent cessation with continued  use of  amitriptyline 25 mg nightly and gabapentin 300 mg nightly.  It was recommended to discontinue gabapentin 300 mg nightly by PCP as she did not gain benefit but she is not aware of this and has continued.  Symptoms previously located left occipital radiating to frontal region with short duration sharp stabbing radiating pain which appears to be secondary to possible occipital neuralgia. Previously underwent sleep study for possible OSA which was negative Continues on Eliquis and aspirin 81 mg without bleeding or bruising Continues to follow with PCP for DM management Blood pressure today 107/60 States depression/anxiety well controlled with continuation of Lexapro No further concerns at this time     ROS:   14 system review of systems performed and negative with exception of memory loss, dizziness, headache, speech difficulty and weakness  PMH:  Past Medical History:  Diagnosis Date   Arthritis    "back" (04/22/2018)   CAD (coronary artery disease)    a. 03/2018 s/p PCI/DES to the RCA (3.0x15 Onyx DES).   Carotid artery stenosis    Mild   Chronic lower back pain    Colon polyps    Diverticulitis    Diverticulosis    Esophageal thickening    seen on pre TAVR CT scan, also questionable cirrhosis. MRI recommended. Will refer to GI   GERD (gastroesophageal reflux disease)    Grave's disease    History of colonic polyps 05/22/2017   History of hiatal hernia    Hypertension    Hypothyroidism    IBS (irritable bowel syndrome)    Osteopenia    Pulmonary nodules    seen on pre TAVR CT. likley benign. no follow up recommended if pt low risk.   S/P TAVR (transcatheter aortic valve replacement)    Severe aortic stenosis    Thalassemia minor    Type II diabetes mellitus (HCC)     PSH:  Past Surgical History:  Procedure Laterality Date   24 HOUR [place] STUDY N/A 03/03/2018   Procedure: 24 HOUR PH STUDY;  Surgeon: Mauri Pole, MD;  Location: [facility]  ENDOSCOPY;  Service: Endoscopy;  Laterality: N/A;   COLONOSCOPY W/ BIOPSIES AND POLYPECTOMY     CORONARY ANGIOGRAPHY Right 04/21/2018   Procedure: CORONARY ANGIOGRAPHY (CATH LAB);  Surgeon: [name], MD;  Location: [place] CV LAB;  Service: Cardiovascular;  Laterality: Right;   CORONARY STENT INTERVENTION N/A 04/22/2018   Procedure: CORONARY STENT INTERVENTION;  Surgeon: [name], MD;  Location: [place] CV LAB;  Service: Cardiovascular;  Laterality: N/A;   DILATION AND CURETTAGE OF UTERUS     ESOPHAGEAL MANOMETRY N/A 03/03/2018   Procedure: ESOPHAGEAL MANOMETRY (EM);  Surgeon: Mauri Pole, MD;  Location: [facility] ENDOSCOPY;  Service: Endoscopy;  Laterality: N/A;   HERNIA REPAIR     HYSTEROSCOPY     fibroids   LAPAROSCOPIC CHOLECYSTECTOMY     LAPAROSCOPY     fibroids   NISSEN FUNDOPLICATION  2725D   RIGHT/LEFT HEART CATH AND CORONARY ANGIOGRAPHY N/A 02/20/2018   Procedure: RIGHT/LEFT HEART CATH AND CORONARY ANGIOGRAPHY;  Surgeon: [name], MD;  Location: [place] CV LAB;  Service: Cardiovascular;  Laterality: N/A;   TEE WITHOUT CARDIOVERSION N/A 07/08/2018   Procedure: TRANSESOPHAGEAL ECHOCARDIOGRAM (TEE);  Surgeon: Burnell Blanks, MD;  Location: [facility];  Service: Open Heart Surgery;  Laterality: N/A;   TEE WITHOUT CARDIOVERSION  10/07/2018   TEE WITHOUT CARDIOVERSION N/A  10/07/2018   Procedure: TRANSESOPHAGEAL ECHOCARDIOGRAM (TEE);  Surgeon: Jerline Pain, MD;  Location: [facility] ENDOSCOPY;  Service: Cardiovascular;  Laterality: N/A;   TONSILLECTOMY     TRANSCATHETER AORTIC VALVE REPLACEMENT, TRANSFEMORAL N/A 07/08/2018   Procedure: TRANSCATHETER AORTIC VALVE REPLACEMENT, TRANSFEMORAL;  Surgeon: Burnell Blanks, MD;  Location: [place];  Service: Open Heart Surgery;  Laterality: N/A;    Social History:  Social History   Socioeconomic History   Marital status: Married    Spouse name: Not on file   Number of children: 2   Years  of education: Not on file   Highest education level: Not on file  Occupational History   Occupation: Tree surgeon of the [employer] resource strain: Not hard at all   Food insecurity    Worry: Not on file    Inability: Not on file   Transportation needs    Medical: No    Non-medical: No  Tobacco Use   Smoking status: Never Smoker   Smokeless tobacco: Never Used  Substance and Sexual Activity   Alcohol use: No    Alcohol/week: 0.0 standard drinks   Drug use: Never   Sexual activity: Not Currently  Lifestyle   Physical activity    Days per week: Not on file    Minutes per session: Not on file   Stress: To some extent  Relationships   Social connections    Talks on phone: Not on file    Gets together: Not on file    Attends religious service: Not on file    Active member of club or organization: Not on file    Attends meetings of clubs or organizations: Not on file    Relationship status: Not on file   Intimate partner violence    Fear of current or ex partner: Not on file    Emotionally abused: Not on file    Physically abused: Not on file    Forced sexual activity: Not on file  Other Topics Concern   Not on file  Social History Narrative   Lives with husband in a one story home.     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   Education: college.    Family History:  Family History  Adopted: Yes  Problem Relation Age of Onset   Healthy Son        x 2   Headache Other        Cluster headaches   Heart failure Mother    Colon cancer Neg Hx    Pancreatic cancer Neg Hx    Rectal cancer Neg Hx    Stomach cancer Neg Hx     Medications:   Current Outpatient Medications on File Prior to Visit  Medication Sig Dispense Refill   amitriptyline (ELAVIL) 25 MG tablet Take 1 tablet (25 mg total) by mouth at bedtime. 30 tablet 0   apixaban  (ELIQUIS) 5 MG TABS tablet Take 1 tablet (5 mg total) by mouth 2 (two) times daily. 60 tablet 0   aspirin 81 MG tablet Take 81 mg by mouth daily.       BD INSULIN SYRINGE U/F 31G X 5/16" 1 ML MISC USE TO INJECT INSULIN EVERY DAY 100 each 3   chlorpheniramine (CHLOR-TRIMETON) 4 MG tablet Take 4 mg by mouth at bedtime.      cholecalciferol (VITAMIN D) 1000 units tablet Take 1,000 Units  by mouth daily.     diltiazem (CARDIZEM CD) 120 MG 24 hr capsule Take 1 capsule (120 mg total) by mouth daily. 30 capsule 0   escitalopram (LEXAPRO) 20 MG tablet Take 1 tablet (20 mg total) by mouth daily. 90 tablet 0   hydrochlorothiazide (HYDRODIURIL) 12.5 MG tablet Take 1 tablet (12.5 mg total) by mouth daily. 90 tablet 0   insulin NPH Human (HUMULIN N) 100 UNIT/ML injection Inject 1 mL (100 Units total) into the skin daily before breakfast. 30 mL 0   levothyroxine (SYNTHROID) 100 MCG tablet TK 1 T PO QD IN THE MORNING OES     losartan (COZAAR) 50 MG tablet TAKE 1 TABLET(50 MG) BY MOUTH DAILY (Patient taking differently: Take 50 mg by mouth daily. ) 90 tablet 0   Melatonin 5 MG TABS Take by mouth.     metFORMIN (GLUCOPHAGE) 500 MG tablet TK 1 T PO QD WC     Multiple Vitamin (MULITIVITAMIN WITH MINERALS) TABS Take 1 tablet by mouth daily.     nitroGLYCERIN (NITROSTAT) 0.4 MG SL tablet Place 1 tablet (0.4 mg total) under the tongue every 5 (five) minutes as needed for chest pain. 25 tablet 3   pantoprazole (PROTONIX) 40 MG tablet Take 1 tablet (40 mg total) by mouth 2 (two) times daily. 60 tablet 5   No current facility-administered medications on file prior to visit.     Allergies:   Allergies  Allergen Reactions   Statins Other (See Comments)    Muscle aches     Physical Exam  Vitals:   12/10/18 1002  BP: 107/60  Pulse: 82  Temp: (!) 97.3 F (36.3 C)  Weight: 193 lb 9.6 oz (87.8 kg)  Height: 5' 5"  (1.651 m)   Body mass index is 32.22 kg/m. No exam data present  Depression  screen [facility] 2/9 12/10/2018  Decreased Interest 0  Down, Depressed, Hopeless 0  PHQ - 2 Score 0  Altered sleeping -  Tired, decreased energy -  Change in appetite -  Feeling bad or failure about yourself  -  Trouble concentrating -  Moving slowly or fidgety/restless -  Suicidal thoughts -  PHQ-9 Score -  Some recent data might be hidden     General: well developed, well nourished,  pleasant elderly Caucasian female, seated, in no evident distress Head: head normocephalic and atraumatic.   Neck: supple with no carotid or supraclavicular bruits Cardiovascular: regular rate and rhythm, no murmurs Musculoskeletal: no deformity Skin:  no rash/petichiae Vascular:  Normal pulses all extremities   Neurologic Exam Mental Status: Awake and fully alert.   Mild slurred speech.  Oriented to place and time. Recent and remote memory intact. Attention span, concentration and fund of knowledge appropriate. Mood and affect appropriate.  Cranial Nerves: Fundoscopic exam reveals sharp disc margins. Pupils equal, briskly reactive to light. Extraocular movements full without nystagmus. Visual fields full to confrontation. Hearing intact. Facial sensation intact.  Left lower facial weakness Motor: Normal bulk and tone.  Pronator drift present left arm.  Mild bilateral hip flexor weakness L>R. Sensory.: intact to touch , pinprick , position and vibratory sensation.  Coordination: Rapid alternating movements normal in all extremities. Finger-to-nose showed and heel-to-shin performed accurately bilaterally. Gait and Station: Arises from chair with mild difficulty. Stance is normal. Gait demonstrates  cautious slow steps with moderate imbalance Reflexes: 1+ and symmetric. Toes downgoing.     NIHSS  3 Modified Rankin  2-3 CHA2DS2-VASc 8 HAS-BLED 2   Diagnostic Data (  Labs, Imaging, Testing)  CT HEAD WO CONTRAST 10/21/2018 IMPRESSION: 1. Small bilateral cerebellar infarcts have occurred since  study earlier this month. 2. Equivocal for interval right insular infarct, certainty degraded by prominent motion artifact.  CT ANGIO HEAD W OR WO CONTRAST CT ANGIO NECK W OR WO CONTRAST 10/21/2018 IMPRESSION: Negative for intracranial large vessel occlusion. No significant intracranial stenosis  Mild atherosclerotic disease in the carotid bifurcation and cavernous carotid bilaterally without hemodynamically significant stenosis  Both vertebral arteries patent to the basilar. Right vertebral artery dominant.  MR BRAIN WO CONTRAST 10/22/2018 IMPRESSION: Diffuse scattered subcentimeter foci of reduced diffusion and intermediate diffusion throughout the supratentorial brain, brainstem, and cerebellum including multiple lesions within the corpus callosum. Given age and history of atrial fibrillation, findings likely represent multiple embolic acute and subacute infarctions. Demyelination is considered unlikely. No hemorrhage or mass effect.  ECHOCARDIOGRAM 10/21/2018 IMPRESSIONS  1. The left ventricle has normal systolic function, with an ejection fraction of 60-65%. The cavity size was normal. There is mildly increased left ventricular wall thickness. Left ventricular diastolic Doppler parameters are consistent with impaired  relaxation. Indeterminate filling pressures.  2. There is moderate mitral annular calcification present. Mild mitral valve stenosis.    ASSESSMENT: Breanna Webster is a 73 y.o. year old female presented with increased shortness of breath and confusion x2 weeks along with headache on 10/21/2018 with recent admission on 10/03/2018 for sepsis and mitral valve endocarditis.  Stroke work-up revealed multifocal bilateral anterior and posterior infarcts cardioembolic pattern secondary to new onset A. fib versus recent endocarditis. Vascular risk factors include history of DVT, aortic stenosis status post TAVR 06/2018, pulmonary hypertension, HTN, DM, CAD status post  stenting 03/2018, recent endocarditis and new onset A. fib.  Residual deficits of mild left hemiparesis, left facial droop with mildly slurred speech, subjective decreased vision and short-term memory concerns    PLAN:  1. Multifocal bilateral anterior and posterior infarcts: Continue aspirin 81 mg daily and Eliquis (apixaban) daily  for secondary stroke prevention. Advised that aspirin 69m was not needed from a stroke standpoint but to follow up with cardiology regarding ongoing use from a cardiac standpoint. Maintain strict control of hypertension with blood pressure goal below 130/90, diabetes with hemoglobin A1c goal below 6.5% and cholesterol with LDL cholesterol (bad cholesterol) goal below 70 mg/dL.  I also advised the patient to eat a healthy diet with plenty of whole grains, cereals, fruits and vegetables, exercise regularly with at least 30 minutes of continuous activity daily and maintain ideal body weight. 2. Mild residual deficits: referral placed for PT/OT/[place] [facility] [place] 3. Atrial fibrillation: continue eliquis and follow up with cardiology  4. HTN: Advised to continue current treatment regimen.  Today's BP satisfactory.  Advised to continue to monitor at home along with continued follow-up with PCP for management 5. DMII: Advised to continue to monitor glucose levels at home along with continued follow-up with PCP for management and monitoring 6. Headaches: Likely musculoskeletal versus occipital neuralgia.  Managed well with use of amitriptyline 25 mg nightly and gabapentin 300 mg nightly.  Consider nerve block injections if needed    Follow up in 3 months or call earlier if needed   Greater than 50% of time during this 45 minute visit was spent on counseling, explanation of diagnosis of embolic infarcts, reviewing risk factor management of A. Fib, HTN, DM, ongoing management of headaches, planning of further management along  with potential future management,  and discussion with patient and family answering all questions.    [name], AGNP-BC  [facility] [address] [place], [place] [address]  Phone [phone] Fax [phone] Note: This document was prepared with digital dictation and possible smart phrase technology. Any transcriptional errors that result from this process are unintentional.

## 2018-12-10 NOTE — Progress Notes (Signed)
I agree with the above plan 

## 2018-12-11 ENCOUNTER — Other Ambulatory Visit: Payer: Self-pay | Admitting: Pharmacy Technician

## 2018-12-11 NOTE — Patient Outreach (Signed)
[facility]) Care Management  12/11/2018  Breanna Webster Apr 14, 1946 [id number]   Successful outreach call placed to patient in regards to BMS application for Eliquis and Novo NOrdisk application for Novolin N.  Spoke to patient, HIPAA identifiers verified. She then handed the phone over to her husband Breanna Webster.  [name] informed that he had called BMS and it was determined that they were over income to apply for the program. The income limit  for BMS for a house hold of 2 is 51.720. Inquired if they still wanted to attempt to apply with a hardship letter and he informed that based on the phone call he had with BMS representative they would not be approved. He informed he did not see any reason in pursing this process.  Inquired if they still wanted to apply for the [employer] for [employer]. He informed that he was not concerned with the cost of the Novolin N as it was only costing them around $25 every 3 months to purchase that medication. Informed that we could still apply and that would be less money for them out of pocket. He informed at this time he was not interested in applying for this program as they are able to afford this medication, it was the Eliquis that was the most cost prohibitive.   Informed patient to check with Cardiology and PCP to see if samples could be provided due to the cost of the medication. [name] informed he would check with the offices and he appreciated the help and concern shown to him by me and [facility] staff. Confirmed patient had name and number if they decided to pursue the patient assistance process.  Will route note to [place] that patient assistance case is being closed and will remove myself from care team.  [name]. [name], [place] Management [phone]

## 2018-12-12 ENCOUNTER — Other Ambulatory Visit: Payer: Self-pay

## 2018-12-12 ENCOUNTER — Other Ambulatory Visit: Payer: Self-pay | Admitting: *Deleted

## 2018-12-12 ENCOUNTER — Telehealth: Payer: Self-pay | Admitting: Pharmacist

## 2018-12-12 DIAGNOSIS — E118 Type 2 diabetes mellitus with unspecified complications: Secondary | ICD-10-CM

## 2018-12-12 MED ORDER — INSULIN NPH (HUMAN) (ISOPHANE) 100 UNIT/ML ~~LOC~~ SUSP: 100.0000 [IU] | Freq: Every day | SUBCUTANEOUS | 0 refills | Status: DC

## 2018-12-12 NOTE — Patient Outreach (Signed)
[place]) Care Management  12/12/2018  Breanna Webster 1945/06/26 [id number]    Telephone Assessment-Preventive Measures (Stroke)  RN spoke with primary caregiver spouse [name]) and confirmed identification of pt. Received update on pt's process as spouse indicates "slow but steady" pending PT services soon. Verified no additional headaches with the ongoing medication gabapentin. States pt continue to do well however continue to have some delays with speech and mobility but no falls or issues reported.   Plan of care discussed with goals and interventions adjusted accordingly based upon pt's progress. Pt continue to attend all medical appointments with pending visits next week and no recent changes in medications other then the gabapentin added for pt's Headaches.  Will continue to encouraged adherence with proactive interventions to prevent readmissions and offer any needed resources to continue pt's management of care (none needed at this time). Will follow up next month with plans to discharge if pt continues to progress through the program with no acute issues, problems or delays. Praise caregiver for his excellent care provided to this pt.  THN CM Care Plan Problem One     Most Recent Value  Care Plan Problem One  Recent hospitalization  Role Documenting the Problem One  Care Management [place] for Problem One  Active  THN Long Term Goal   Pt will not have readmission over the next 90 days related to stroke.  [facility]N Long Term Goal Start Date  10/28/18  Interventions for Problem One Long Term Goal  Will continue to encourage ongoing management of care with prevention measues and verifiy pt has not had any hospitalization since conception with [facility] services. Will continue to offer available resoruces to assist with pt's ongoing management of care. Will strongly encouraged caregiver on plan of care with contacting pt's provider prior to acute events or  precipitating symptoms. Will re-evaluate in one month with ongoing case management needs accordingly.  THN CM Short Term Goal #1   Adherence with medical appointments post hospital d/c over the next 30 days.  THN CM Short Term Goal #1 Start Date  10/28/18  [facility] CM Short Term Goal #1 Met Date  12/12/18  [facility] CM Short Term Goal #2   Adherence with medication post hospital d/c over the next 30 days.  THN CM Short Term Goal #2 Start Date  10/28/18  [facility] CM Short Term Goal #2 Met Date  12/12/18      [name], RN Care Management Coordinator [place] Office [phone]

## 2018-12-12 NOTE — Patient Outreach (Signed)
[place] [facility] [facility]) Care Management  12/12/2018  Breanna Webster 1945-09-06 [id number]   Patient and her husband were denied patient assistance through [place] due to being over income.  They decided not to pursue patient assistance through [employer] since they were denied Eliquis thorough BMS.  Plan: Close patient's pharmacy case. Alert [facility] Community Nurse still involved with patient's case.  Elayne Guerin, PharmD, [place] Clinical Pharmacist [phone]

## 2018-12-15 DIAGNOSIS — Z952 Presence of prosthetic heart valve: Secondary | ICD-10-CM | POA: Diagnosis not present

## 2018-12-15 DIAGNOSIS — Z794 Long term (current) use of insulin: Secondary | ICD-10-CM | POA: Diagnosis not present

## 2018-12-15 DIAGNOSIS — E039 Hypothyroidism, unspecified: Secondary | ICD-10-CM | POA: Diagnosis not present

## 2018-12-15 DIAGNOSIS — I639 Cerebral infarction, unspecified: Secondary | ICD-10-CM | POA: Diagnosis not present

## 2018-12-15 DIAGNOSIS — E118 Type 2 diabetes mellitus with unspecified complications: Secondary | ICD-10-CM | POA: Diagnosis not present

## 2018-12-15 DIAGNOSIS — E1165 Type 2 diabetes mellitus with hyperglycemia: Secondary | ICD-10-CM | POA: Diagnosis not present

## 2018-12-15 DIAGNOSIS — Z6831 Body mass index (BMI) 31.0-31.9, adult: Secondary | ICD-10-CM | POA: Diagnosis not present

## 2018-12-15 DIAGNOSIS — I4891 Unspecified atrial fibrillation: Secondary | ICD-10-CM | POA: Diagnosis not present

## 2018-12-15 DIAGNOSIS — I2510 Atherosclerotic heart disease of native coronary artery without angina pectoris: Secondary | ICD-10-CM | POA: Diagnosis not present

## 2018-12-16 ENCOUNTER — Other Ambulatory Visit: Payer: Self-pay | Admitting: Adult Health

## 2018-12-16 DIAGNOSIS — Z760 Encounter for issue of repeat prescription: Secondary | ICD-10-CM

## 2018-12-16 NOTE — Progress Notes (Signed)
Cardiology Office Note:    Date:  12/17/2018   ID:  Breanna Webster, DOB Aug 24, 1945, MRN [id number]  PCP:  [name], NP  Cardiologist:  Sinclair Grooms, MD   Referring MD: [name], NP   Chief Complaint  Patient presents with  . Coronary Artery Disease  . Hyperlipidemia  . Cardiac Valve Problem    History of Present Illness:    Breanna Webster is a 73 y.o. female with a hx of diabetes mellitus, Graves disease, aortic stenosis status post TAVR in 3/20, hypothyroidism, hypertension, pulmonary hypertension and coronary disease withPCI/DES x1 to the mRCA back in 12/19. Mitral valve endocarditis 09/2018, PAF, and cerebellar infarction 10/2018.  [name] is doing relatively well.  She has been through a lot starting with TAVR in March.  Prior to TAVR she underwent PCI.  Our goal had been to treat the patient because of severe limitations due to dyspnea.  There was suspicion that the dyspnea was not cardiac related.  Subsequently once stent and TAVR were completed she still complained of decreased exertional tolerance and dyspnea which was the initial complaint.  She then had further complications after developing sepsis, found to be related to endocarditis on the mitral valve, and subsequently had readmission to the hospital with posterior circulation stroke and disorientation.  She is now back at home.  She has completed a 6-week course of antibiotics for endocarditis.  She is accompanied by her husband today.  She is able to communicate but clearly has some difficulty with memory and communication compared to prior.  She denies chest pain.  Dyspnea has not improved.  Her husband is encouraging physical activity but she is fighting it.  She is not sleeping well.  She is gaining weight.  She stays up late at night and sleeps during the day.  She is frustrated that she cannot remember details.  We discussed her cardiac problems as follows: Significant diastolic heart failure which is  contributing to dyspnea; history of aortic stenosis treated with TAVR successfully; asymptomatic coronary artery disease treated with stenting prior to TAVR; atrial fibrillation/flutter secondary to the underlying structural and vascular heart disease noted.  Stroke secondary to either atrial fibrillation/flutter or embolization from mitral valve vegetation.  Past Medical History:  Diagnosis Date  . Arthritis    "back" (04/22/2018)  . CAD (coronary artery disease)    a. 03/2018 s/p PCI/DES to the RCA (3.0x15 Onyx DES).  . Carotid artery stenosis    Mild  . Chronic lower back pain   . Colon polyps   . Diverticulitis   . Diverticulosis   . Esophageal thickening    seen on pre TAVR CT scan, also questionable cirrhosis. MRI recommended. Will refer to GI  . GERD (gastroesophageal reflux disease)   . Grave's disease   . History of colonic polyps 05/22/2017  . History of hiatal hernia   . Hypertension   . Hypothyroidism   . IBS (irritable bowel syndrome)   . Osteopenia   . Pulmonary nodules    seen on pre TAVR CT. likley benign. no follow up recommended if pt low risk.  . S/P TAVR (transcatheter aortic valve replacement)   . Severe aortic stenosis   . Thalassemia minor   . Type II diabetes mellitus ([place])     Past Surgical History:  Procedure Laterality Date  . [place] STUDY N/A 03/03/2018   Procedure: [place] STUDY;  Surgeon: Mauri Pole, MD;  Location: [facility];  Service: Endoscopy;  Laterality: N/A;  . COLONOSCOPY W/ BIOPSIES AND POLYPECTOMY    . CORONARY ANGIOGRAPHY Right 04/21/2018   Procedure: CORONARY ANGIOGRAPHY (CATH LAB);  Surgeon: [name], MD;  Location: [place] [facility];  Service: Cardiovascular;  Laterality: Right;  . CORONARY STENT INTERVENTION N/A 04/22/2018   Procedure: CORONARY STENT INTERVENTION;  Surgeon: [name], MD;  Location: [place] [facility];  Service: Cardiovascular;  Laterality: N/A;  . DILATION AND CURETTAGE OF UTERUS    .  ESOPHAGEAL MANOMETRY N/A 03/03/2018   Procedure: ESOPHAGEAL MANOMETRY (EM);  Surgeon: Mauri Pole, MD;  Location: [facility];  Service: Endoscopy;  Laterality: N/A;  . HERNIA REPAIR    . HYSTEROSCOPY     fibroids  . LAPAROSCOPIC CHOLECYSTECTOMY    . LAPAROSCOPY     fibroids  . NISSEN FUNDOPLICATION  [id number]  . RIGHT/LEFT HEART CATH AND CORONARY ANGIOGRAPHY N/A 02/20/2018   Procedure: RIGHT/LEFT HEART CATH AND CORONARY ANGIOGRAPHY;  Surgeon: [name], MD;  Location: [place] [facility];  Service: Cardiovascular;  Laterality: N/A;  . TEE WITHOUT CARDIOVERSION N/A 07/08/2018   Procedure: TRANSESOPHAGEAL ECHOCARDIOGRAM (TEE);  Surgeon: Burnell Blanks, MD;  Location: [facility];  Service: Open Heart Surgery;  Laterality: N/A;  . TEE WITHOUT CARDIOVERSION  10/07/2018  . TEE WITHOUT CARDIOVERSION N/A 10/07/2018   Procedure: TRANSESOPHAGEAL ECHOCARDIOGRAM (TEE);  Surgeon: Jerline Pain, MD;  Location: [facility];  Service: Cardiovascular;  Laterality: N/A;  . TONSILLECTOMY    . TRANSCATHETER AORTIC VALVE REPLACEMENT, TRANSFEMORAL N/A 07/08/2018   Procedure: TRANSCATHETER AORTIC VALVE REPLACEMENT, TRANSFEMORAL;  Surgeon: Burnell Blanks, MD;  Location: [facility];  Service: Open Heart Surgery;  Laterality: N/A;    Current Medications: Current Meds  Medication Sig  . amitriptyline (ELAVIL) 25 MG tablet Take 1 tablet (25 mg total) by mouth at bedtime.  Marland Kitchen apixaban (ELIQUIS) 5 MG TABS tablet Take 1 tablet (5 mg total) by mouth 2 (two) times daily.  Marland Kitchen aspirin 81 MG tablet Take 81 mg by mouth daily.    . BD INSULIN SYRINGE U/F 31G X 5/16" 1 ML MISC USE TO INJECT INSULIN EVERY DAY  . chlorpheniramine (CHLOR-TRIMETON) 4 MG tablet Take 4 mg by mouth at bedtime.   . cholecalciferol (VITAMIN D) 1000 units tablet Take 1,000 Units by mouth daily.  Marland Kitchen diltiazem (CARDIZEM CD) 120 MG 24 hr capsule TAKE 1 CAPSULE(120 MG) BY MOUTH DAILY  . escitalopram (LEXAPRO) 20 MG tablet Take 1 tablet (20  mg total) by mouth daily.  Marland Kitchen gabapentin (NEURONTIN) 300 MG capsule Take 1 capsule (300 mg total) by mouth at bedtime.  . hydrochlorothiazide (HYDRODIURIL) 12.5 MG tablet Take 1 tablet (12.5 mg total) by mouth daily.  . insulin NPH Human (HUMULIN N) 100 UNIT/ML injection Inject 1 mL (100 Units total) into the skin daily before breakfast.  . levothyroxine (SYNTHROID) 100 MCG tablet TK 1 T PO QD IN THE MORNING OES  . losartan (COZAAR) 50 MG tablet TAKE 1 TABLET(50 MG) BY MOUTH DAILY  . Melatonin 5 MG TABS Take by mouth.  . metFORMIN (GLUCOPHAGE) 500 MG tablet Take 500 mg by mouth 2 (two) times daily with a meal.   . Multiple Vitamin (MULITIVITAMIN WITH MINERALS) TABS Take 1 tablet by mouth daily.  . nitroGLYCERIN (NITROSTAT) 0.4 MG SL tablet Place 1 tablet (0.4 mg total) under the tongue every 5 (five) minutes as needed for chest pain.  . pantoprazole (PROTONIX) 40 MG tablet Take 1 tablet (40 mg  total) by mouth 2 (two) times daily.     Allergies:   Statins   Social History   Socioeconomic History  . Marital status: Married    Spouse name: Not on file  . Number of children: 2  . Years of education: Not on file  . Highest education level: Not on file  Occupational History  . Occupation: Tree surgeon of the [employer]  . Financial resource strain: Not hard at all  . Food insecurity    Worry: Not on file    Inability: Not on file  . Transportation needs    Medical: No    Non-medical: No  Tobacco Use  . Smoking status: Never Smoker  . Smokeless tobacco: Never Used  Substance and Sexual Activity  . Alcohol use: No    Alcohol/week: 0.0 standard drinks  . Drug use: Never  . Sexual activity: Not Currently  Lifestyle  . Physical activity    Days per week: Not on file    Minutes per session: Not on file  . Stress: To some extent  Relationships  . Social Herbalist on phone: Not on file    Gets together: Not on file    Attends religious  service: Not on file    Active member of club or organization: Not on file    Attends meetings of clubs or organizations: Not on file    Relationship status: Not on file  Other Topics Concern  . Not on file  Social History Narrative   Lives with husband in a one story home.     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   Education: college.     Family History: The patient's family history includes Headache in an other family member; Healthy in her son; Heart failure in her mother. There is no history of Colon cancer, Pancreatic cancer, Rectal cancer, or Stomach cancer. She was adopted.  ROS:   Please see the history of present illness.    Dyspnea and fatigue.  Confusion and difficulty with speech since her stroke.  Difficulty affording apixaban.  All other systems reviewed and are negative.  EKGs/Labs/Other Studies Reviewed:    The following studies were reviewed today: No recent imaging other than  EKG:  EKG performed on 11/17/2018 demonstrates sinus rhythm with left bundle branch block.  Recent Labs: 02/19/2018: NT-Pro BNP 270 07/07/2018: B Natriuretic Peptide 112.2 10/05/2018: ALT 26 10/23/2018: Magnesium 1.7 11/12/2018: TSH 9.460 11/17/2018: BUN 12; Creatinine, Ser 0.69; Hemoglobin 10.4; Platelets 178; Potassium 3.9; Sodium 136  Recent Lipid Panel    Component Value Date/Time   CHOL 128 10/22/2018 0445   CHOL 156 11/21/2016 1113   TRIG 95 10/22/2018 0445   HDL 24 (L) 10/22/2018 0445   HDL 44 11/21/2016 1113   CHOLHDL 5.3 10/22/2018 0445   VLDL 19 10/22/2018 0445   LDLCALC 85 10/22/2018 0445   LDLCALC 91 11/21/2016 1113   LDLDIRECT 98.0 05/24/2014 1343    Physical Exam:    VS:  BP 128/64   Pulse 91   Ht 5' 5"  (1.651 m)   Wt 190 lb 12.8 oz (86.5 kg)   SpO2 98%   BMI 31.75 kg/m     Wt Readings from Last 3 Encounters:  12/17/18 190 lb 12.8 oz (86.5 kg)  12/10/18 193 lb 9.6 oz (87.8 kg)  11/20/18  189 lb (85.7 kg)     GEN:  Abdominal obesity.  Masked.. No acute distress HEENT: Normal NECK: No JVD. LYMPHATICS: No lymphadenopathy CARDIAC:  RRR without murmur, gallop, or edema. VASCULAR:  Normal Pulses. No bruits. RESPIRATORY:  Clear to auscultation without rales, wheezing or rhonchi  ABDOMEN: Soft, non-tender, non-distended, No pulsatile mass, MUSCULOSKELETAL: No deformity  SKIN: Warm and dry NEUROLOGIC:  Alert and oriented x 3 PSYCHIATRIC:  Normal affect   ASSESSMENT:    1. S/P TAVR (transcatheter aortic valve replacement)   2. Essential hypertension, benign   3. Paroxysmal atrial fibrillation (HCC)   4. Mitral valve vegetation   5. Cerebellar stroke, acute ([place])   6. Coronary artery disease involving native coronary artery of native heart without angina pectoris   7. DOE (dyspnea on exertion)   8. Left bundle branch block   9. Type 2 diabetes mellitus without complication, without long-term current use of insulin ([place])   10. Educated About Covid-19 Virus Infection    PLAN:    In order of problems listed above:  1. Aortic stenosis been treated with TAVR successfully. 2. Adequate blood pressure control 3. Currently in sinus rhythm based on exam.  Anticoagulation therapy is being continued.  Chads Vasc is greatly more than 2 4. Mitral valve vegetation.  Completed 6-week course of antibiotic therapy. 5. Posterior circulation stroke secondary to embolization from vegetation versus thromboemboli from atrial fib 6. No symptoms to suggest coronary ischemia.  She had angiographically significant right coronary disease that was treated prior to TAVR 7. Continues to be a problem and at this point is related to physical deconditioning and diastolic heart failure.  Improvement will only occur if she invests time and effort into conditioning/rehab. 8. This will need to be watched to make sure that she does not progress to higher grade of conduction abnormality in the setting of TAVR.  9. Hemoglobin A1c needs to be less than 7. 10. Masking, handwashing, and social distancing is stressed.  Greater than 50% of the time during this office visit was spent in education, counseling, and coordination of care related to underlying disease process and testing as outlined.  Long discussion concerning dyspnea and its mixed etiology.  Clearly was not solely related to coronary disease and TAVR.  Diastolic heart failure and physical deconditioning or major issues currently.  She is having difficulty with sleep.  We discussed the impact of physical activity during the day on the possibility that she will be able to sleep better at night.  We discussed difficulty they are having with affording her current medical regimen.  Suggested Candida versus switching to another agent that is viewed more favorably by their insurance plan.   Medication Adjustments/Labs and Tests Ordered: Current medicines are reviewed at length with the patient today.  Concerns regarding medicines are outlined above.  No orders of the defined types were placed in this encounter.  No orders of the defined types were placed in this encounter.   Patient Instructions  Medication Instructions:  Your physician recommends that you continue on your current medications as directed. Please refer to the Current Medication list given to you today.  If you need a refill on your cardiac medications before your next appointment, please call your pharmacy.   Lab work: None If you have labs (blood work) drawn today and your tests are completely normal, you will receive your results only by: Marland Kitchen MyChart Message (if you have MyChart) OR . A paper copy in the mail If you have any lab test that is abnormal or we  need to change your treatment, we will call you to review the results.  Testing/Procedures: None  Follow-Up: Your physician recommends that you schedule a follow-up appointment in: 4 months with Truitt Merle, NP.   At  [facility], you and your health needs are our priority.  As part of our continuing mission to provide you with exceptional heart care, we have created designated Provider Care Teams.  These Care Teams include your primary Cardiologist (physician) and Advanced Practice Providers (APPs -  Physician Assistants and Nurse Practitioners) who all work together to provide you with the care you need, when you need it. You will need a follow up appointment in 9-12 months.  Please call our office 2 months in advance to schedule this appointment.  You may see Sinclair Grooms, MD or one of the following Advanced Practice Providers on your designated Care Team:   Truitt Merle, NP Cecilie Kicks, NP . Kathyrn Drown, NP  Any Other Special Instructions Will Be Listed Below (If Applicable).  Check with your insurance on the cost of Pradaxa and Xarelto and let us know if one of those would be cheaper than the Eliquis.    Your physician spoke with you about being more physically active.  When allowed, please participate in occupational therapy and physical therapy as suggested.       Signed, Sinclair Grooms, MD  12/17/2018 12:41 PM    [place]

## 2018-12-17 ENCOUNTER — Other Ambulatory Visit: Payer: Self-pay

## 2018-12-17 ENCOUNTER — Ambulatory Visit (INDEPENDENT_AMBULATORY_CARE_PROVIDER_SITE_OTHER): Payer: Medicare Other | Admitting: Interventional Cardiology

## 2018-12-17 ENCOUNTER — Encounter: Payer: Self-pay | Admitting: Family Medicine

## 2018-12-17 ENCOUNTER — Encounter: Payer: Self-pay | Admitting: Interventional Cardiology

## 2018-12-17 VITALS — BP 128/64 | HR 91 | Ht 65.0 in | Wt 190.8 lb

## 2018-12-17 DIAGNOSIS — I447 Left bundle-branch block, unspecified: Secondary | ICD-10-CM | POA: Diagnosis not present

## 2018-12-17 DIAGNOSIS — I639 Cerebral infarction, unspecified: Secondary | ICD-10-CM | POA: Diagnosis not present

## 2018-12-17 DIAGNOSIS — R0600 Dyspnea, unspecified: Secondary | ICD-10-CM

## 2018-12-17 DIAGNOSIS — I480 Paroxysmal atrial fibrillation: Secondary | ICD-10-CM | POA: Diagnosis not present

## 2018-12-17 DIAGNOSIS — R0609 Other forms of dyspnea: Secondary | ICD-10-CM | POA: Diagnosis not present

## 2018-12-17 DIAGNOSIS — I2510 Atherosclerotic heart disease of native coronary artery without angina pectoris: Secondary | ICD-10-CM | POA: Diagnosis not present

## 2018-12-17 DIAGNOSIS — Z952 Presence of prosthetic heart valve: Secondary | ICD-10-CM

## 2018-12-17 DIAGNOSIS — Z7189 Other specified counseling: Secondary | ICD-10-CM

## 2018-12-17 DIAGNOSIS — I10 Essential (primary) hypertension: Secondary | ICD-10-CM | POA: Diagnosis not present

## 2018-12-17 DIAGNOSIS — I330 Acute and subacute infective endocarditis: Secondary | ICD-10-CM | POA: Diagnosis not present

## 2018-12-17 DIAGNOSIS — E119 Type 2 diabetes mellitus without complications: Secondary | ICD-10-CM | POA: Diagnosis not present

## 2018-12-17 NOTE — Patient Instructions (Signed)
Medication Instructions:  Your physician recommends that you continue on your current medications as directed. Please refer to the Current Medication list given to you today.  If you need a refill on your cardiac medications before your next appointment, please call your pharmacy.   Lab work: None If you have labs (blood work) drawn today and your tests are completely normal, you will receive your results only by: Marland Kitchen MyChart Message (if you have MyChart) OR . A paper copy in the mail If you have any lab test that is abnormal or we need to change your treatment, we will call you to review the results.  Testing/Procedures: None  Follow-Up: Your physician recommends that you schedule a follow-up appointment in: 4 months with Truitt Merle, NP.   At [facility], you and your health needs are our priority.  As part of our continuing mission to provide you with exceptional heart care, we have created designated Provider Care Teams.  These Care Teams include your primary Cardiologist (physician) and Advanced Practice Providers (APPs -  Physician Assistants and Nurse Practitioners) who all work together to provide you with the care you need, when you need it. You will need a follow up appointment in 9-12 months.  Please call our office 2 months in advance to schedule this appointment.  You may see Sinclair Grooms, MD or one of the following Advanced Practice Providers on your designated Care Team:   Truitt Merle, NP Cecilie Kicks, NP . Kathyrn Drown, NP  Any Other Special Instructions Will Be Listed Below (If Applicable).  Check with your insurance on the cost of Pradaxa and Xarelto and let us know if one of those would be cheaper than the Eliquis.    Your physician spoke with you about being more physically active.  When allowed, please participate in occupational therapy and physical therapy as suggested.

## 2018-12-18 ENCOUNTER — Encounter: Payer: Self-pay | Admitting: Adult Health

## 2018-12-18 ENCOUNTER — Other Ambulatory Visit: Payer: Self-pay | Admitting: Adult Health

## 2018-12-18 DIAGNOSIS — I10 Essential (primary) hypertension: Secondary | ICD-10-CM

## 2018-12-18 MED ORDER — HYDROCHLOROTHIAZIDE 12.5 MG PO TABS: 12.5000 mg | ORAL_TABLET | Freq: Every day | ORAL | 1 refills | Status: DC

## 2018-12-22 ENCOUNTER — Ambulatory Visit: Payer: Medicare Other | Admitting: Endocrinology

## 2018-12-23 ENCOUNTER — Telehealth: Payer: Self-pay | Admitting: Interventional Cardiology

## 2018-12-23 NOTE — Telephone Encounter (Signed)
Breanna Webster, [facility] called for an order for PT for this patient.  Order can be faxed [phone].

## 2018-12-24 ENCOUNTER — Other Ambulatory Visit: Payer: Self-pay | Admitting: Adult Health

## 2018-12-24 ENCOUNTER — Ambulatory Visit: Payer: Self-pay | Admitting: Pharmacist

## 2018-12-24 DIAGNOSIS — R519 Headache, unspecified: Secondary | ICD-10-CM

## 2018-12-24 NOTE — Telephone Encounter (Signed)
Since no phone number available, faxed over a request for more information.  Need to know how many times per week and for how many weeks pt will be doing PT.  Dr. Tamala Julian agreeable to sign orders.

## 2018-12-24 NOTE — Telephone Encounter (Signed)
Faxed orders to requesting facility.

## 2018-12-24 NOTE — Telephone Encounter (Signed)
PT will be 3x per week for 2 mos.  They said they will send a plan of care once she is evaluated.

## 2018-12-29 ENCOUNTER — Other Ambulatory Visit: Payer: Self-pay | Admitting: Endocrinology

## 2018-12-30 ENCOUNTER — Other Ambulatory Visit: Payer: Self-pay | Admitting: Adult Health

## 2018-12-31 ENCOUNTER — Encounter: Payer: Self-pay | Admitting: Adult Health

## 2018-12-31 NOTE — Telephone Encounter (Signed)
Per Tommi Rumps, pt has been scheduled for a virtual visit.

## 2018-12-31 NOTE — Telephone Encounter (Signed)
Left a message for a return call.

## 2019-01-01 ENCOUNTER — Other Ambulatory Visit: Payer: Self-pay

## 2019-01-01 ENCOUNTER — Telehealth (INDEPENDENT_AMBULATORY_CARE_PROVIDER_SITE_OTHER): Payer: Medicare Other | Admitting: Adult Health

## 2019-01-01 DIAGNOSIS — G2581 Restless legs syndrome: Secondary | ICD-10-CM | POA: Diagnosis not present

## 2019-01-01 DIAGNOSIS — I639 Cerebral infarction, unspecified: Secondary | ICD-10-CM

## 2019-01-01 DIAGNOSIS — R350 Frequency of micturition: Secondary | ICD-10-CM

## 2019-01-01 NOTE — Progress Notes (Signed)
Virtual Visit via Video Note  I connected with Breanna Webster  on 01/01/19 at  4:00 PM EDT by a video enabled telemedicine application and verified that I am speaking with the correct person using two identifiers.  Location patient: home Location provider:work or home office Persons participating in the virtual visit: patient, provider  I discussed the limitations of evaluation and management by telemedicine and the availability of in person appointments. The patient expressed understanding and agreed to proceed.   HPI: 73 year old female who is being evaluated today for 2 acute issues.  She reports over the last few days she has been experiencing urinary frequency and urgency as well as cloudy urine.  She denies dysuria, hematuria, low back pain, pelvic pressure, fevers, or chills.  She is wondering if she has a urinary tract infection.  She also reports sudden onset of restless leg syndrome.  He recently started amitriptyline for headaches after stroke as well as for transient insomnia.  He is able to fall asleep easily but is awoken from the restless legs  ROS: See pertinent positives and negatives per HPI.  Past Medical History:  Diagnosis Date  . Arthritis    "back" (04/22/2018)  . CAD (coronary artery disease)    a. 03/2018 s/p PCI/DES to the RCA (3.0x15 Onyx DES).  . Carotid artery stenosis    Mild  . Chronic lower back pain   . Colon polyps   . Diverticulitis   . Diverticulosis   . Esophageal thickening    seen on pre TAVR CT scan, also questionable cirrhosis. MRI recommended. Will refer to GI  . GERD (gastroesophageal reflux disease)   . Grave's disease   . History of colonic polyps 05/22/2017  . History of hiatal hernia   . Hypertension   . Hypothyroidism   . IBS (irritable bowel syndrome)   . Osteopenia   . Pulmonary nodules    seen on pre TAVR CT. likley benign. no follow up recommended if pt low risk.  . S/P TAVR (transcatheter aortic valve replacement)   .  Severe aortic stenosis   . Thalassemia minor   . Type II diabetes mellitus ([place])     Past Surgical History:  Procedure Laterality Date  . [place] STUDY N/A 03/03/2018   Procedure: [place] STUDY;  Surgeon: Mauri Pole, MD;  Location: [facility];  Service: Endoscopy;  Laterality: N/A;  . COLONOSCOPY W/ BIOPSIES AND POLYPECTOMY    . CORONARY ANGIOGRAPHY Right 04/21/2018   Procedure: CORONARY ANGIOGRAPHY (CATH LAB);  Surgeon: [name], MD;  Location: [place] [facility];  Service: Cardiovascular;  Laterality: Right;  . CORONARY STENT INTERVENTION N/A 04/22/2018   Procedure: CORONARY STENT INTERVENTION;  Surgeon: [name], MD;  Location: [place] [facility];  Service: Cardiovascular;  Laterality: N/A;  . DILATION AND CURETTAGE OF UTERUS    . ESOPHAGEAL MANOMETRY N/A 03/03/2018   Procedure: ESOPHAGEAL MANOMETRY (EM);  Surgeon: Mauri Pole, MD;  Location: [facility];  Service: Endoscopy;  Laterality: N/A;  . HERNIA REPAIR    . HYSTEROSCOPY     fibroids  . LAPAROSCOPIC CHOLECYSTECTOMY    . LAPAROSCOPY     fibroids  . NISSEN FUNDOPLICATION  8502D  . RIGHT/LEFT HEART CATH AND CORONARY ANGIOGRAPHY N/A 02/20/2018   Procedure: RIGHT/LEFT HEART CATH AND CORONARY ANGIOGRAPHY;  Surgeon: [name], MD;  Location: [place] [facility];  Service: Cardiovascular;  Laterality: N/A;  . TEE WITHOUT CARDIOVERSION N/A 07/08/2018   Procedure:  TRANSESOPHAGEAL ECHOCARDIOGRAM (TEE);  Surgeon: Burnell Blanks, MD;  Location: [facility];  Service: Open Heart Surgery;  Laterality: N/A;  . TEE WITHOUT CARDIOVERSION  10/07/2018  . TEE WITHOUT CARDIOVERSION N/A 10/07/2018   Procedure: TRANSESOPHAGEAL ECHOCARDIOGRAM (TEE);  Surgeon: Jerline Pain, MD;  Location: [facility];  Service: Cardiovascular;  Laterality: N/A;  . TONSILLECTOMY    . TRANSCATHETER AORTIC VALVE REPLACEMENT, TRANSFEMORAL N/A 07/08/2018   Procedure: TRANSCATHETER AORTIC VALVE REPLACEMENT, TRANSFEMORAL;   Surgeon: Burnell Blanks, MD;  Location: [facility];  Service: Open Heart Surgery;  Laterality: N/A;    Family History  Adopted: Yes  Problem Relation Age of Onset  . Healthy Son        x 2  . Headache Other        Cluster headaches  . Heart failure Mother   . Colon cancer Neg Hx   . Pancreatic cancer Neg Hx   . Rectal cancer Neg Hx   . Stomach cancer Neg Hx      Current Outpatient Medications:  .  amitriptyline (ELAVIL) 25 MG tablet, TAKE 1 TABLET(25 MG) BY MOUTH AT BEDTIME, Disp: 90 tablet, Rfl: 1 .  apixaban (ELIQUIS) 5 MG TABS tablet, Take 1 tablet (5 mg total) by mouth 2 (two) times daily., Disp: 180 tablet, Rfl: 1 .  aspirin 81 MG tablet, Take 81 mg by mouth daily.  , Disp: , Rfl:  .  BD INSULIN SYRINGE U/F 31G X 5/16" 1 ML MISC, USE TO INJECT INSULIN EVERY DAY, Disp: 100 each, Rfl: 3 .  chlorpheniramine (CHLOR-TRIMETON) 4 MG tablet, Take 4 mg by mouth at bedtime. , Disp: , Rfl:  .  cholecalciferol (VITAMIN D) 1000 units tablet, Take 1,000 Units by mouth daily., Disp: , Rfl:  .  diltiazem (CARDIZEM CD) 120 MG 24 hr capsule, TAKE 1 CAPSULE(120 MG) BY MOUTH DAILY, Disp: 90 capsule, Rfl: 1 .  escitalopram (LEXAPRO) 20 MG tablet, TAKE 1 TABLET BY MOUTH EVERY DAY, Disp: 90 tablet, Rfl: 1 .  hydrochlorothiazide (HYDRODIURIL) 12.5 MG tablet, Take 1 tablet (12.5 mg total) by mouth daily., Disp: 90 tablet, Rfl: 1 .  insulin NPH Human (HUMULIN N) 100 UNIT/ML injection, Inject 1 mL (100 Units total) into the skin daily before breakfast., Disp: 30 mL, Rfl: 0 .  levothyroxine (SYNTHROID) 100 MCG tablet, TK 1 T PO QD IN THE MORNING OES, Disp: , Rfl:  .  losartan (COZAAR) 50 MG tablet, TAKE 1 TABLET(50 MG) BY MOUTH DAILY, Disp: 90 tablet, Rfl: 0 .  Melatonin 5 MG TABS, Take by mouth., Disp: , Rfl:  .  metFORMIN (GLUCOPHAGE) 500 MG tablet, Take 500 mg by mouth 2 (two) times daily with a meal. , Disp: , Rfl:  .  Multiple Vitamin (MULITIVITAMIN WITH MINERALS) TABS, Take 1 tablet by mouth  daily., Disp: , Rfl:  .  nitroGLYCERIN (NITROSTAT) 0.4 MG SL tablet, Place 1 tablet (0.4 mg total) under the tongue every 5 (five) minutes as needed for chest pain., Disp: 25 tablet, Rfl: 3 .  ONETOUCH VERIO test strip, USE TO MONITOR GLUCOSE LEVELS TWICE DAILY, Disp: 200 strip, Rfl: 3 .  pantoprazole (PROTONIX) 40 MG tablet, Take 1 tablet (40 mg total) by mouth 2 (two) times daily., Disp: 60 tablet, Rfl: 5  EXAM:  VITALS per patient if applicable:  GENERAL: alert, oriented, appears well and in no acute distress  HEENT: atraumatic, conjunttiva clear, no obvious abnormalities on inspection of external nose and ears  NECK: normal movements of the  head and neck  LUNGS: on inspection no signs of respiratory distress, breathing rate appears normal, no obvious gross SOB, gasping or wheezing  CV: no obvious cyanosis  MS: moves all visible extremities without noticeable abnormality  PSYCH/NEURO: pleasant and cooperative, no obvious depression or anxiety, speech and thought processing grossly intact  ASSESSMENT AND PLAN:  Discussed the following assessment and plan:  1. Urinary frequency - Will have her come in for urinalysis  - POC Urinalysis Dipstick  2. Restless leg -Possible side effect from amitriptyline.  We will have her first try cutting this medication in half if that does not work we will have her stop it.  She was advised to follow-up if restless leg continues     I discussed the assessment and treatment plan with the patient. The patient was provided an opportunity to ask questions and all were answered. The patient agreed with the plan and demonstrated an understanding of the instructions.   The patient was advised to call back or seek an in-person evaluation if the symptoms worsen or if the condition fails to improve as anticipated.   [name], NP

## 2019-01-02 ENCOUNTER — Other Ambulatory Visit (INDEPENDENT_AMBULATORY_CARE_PROVIDER_SITE_OTHER): Payer: Medicare Other

## 2019-01-02 DIAGNOSIS — R350 Frequency of micturition: Secondary | ICD-10-CM | POA: Diagnosis not present

## 2019-01-02 LAB — POCT URINALYSIS DIPSTICK
Bilirubin, UA: NEGATIVE
Blood, UA: NEGATIVE
Glucose, UA: NEGATIVE
Ketones, UA: NEGATIVE
Leukocytes, UA: NEGATIVE
Nitrite, UA: NEGATIVE
Odor: NEGATIVE
Protein, UA: NEGATIVE
Spec Grav, UA: 1.015 (ref 1.010–1.025)
Urobilinogen, UA: 0.2 E.U./dL
pH, UA: 6 (ref 5.0–8.0)

## 2019-01-05 ENCOUNTER — Other Ambulatory Visit: Payer: Self-pay | Admitting: Obstetrics

## 2019-01-05 DIAGNOSIS — Z1231 Encounter for screening mammogram for malignant neoplasm of breast: Secondary | ICD-10-CM

## 2019-01-05 DIAGNOSIS — R2689 Other abnormalities of gait and mobility: Secondary | ICD-10-CM | POA: Diagnosis not present

## 2019-01-05 DIAGNOSIS — I69398 Other sequelae of cerebral infarction: Secondary | ICD-10-CM | POA: Diagnosis not present

## 2019-01-05 DIAGNOSIS — Z7409 Other reduced mobility: Secondary | ICD-10-CM | POA: Diagnosis not present

## 2019-01-12 ENCOUNTER — Telehealth: Payer: Self-pay | Admitting: Adult Health

## 2019-01-12 ENCOUNTER — Other Ambulatory Visit: Payer: Self-pay

## 2019-01-12 ENCOUNTER — Other Ambulatory Visit: Payer: Self-pay | Admitting: Adult Health

## 2019-01-12 ENCOUNTER — Other Ambulatory Visit: Payer: Self-pay | Admitting: *Deleted

## 2019-01-12 DIAGNOSIS — E038 Other specified hypothyroidism: Secondary | ICD-10-CM

## 2019-01-12 MED ORDER — LEVOTHYROXINE SODIUM 175 MCG PO TABS: 175.0000 ug | ORAL_TABLET | Freq: Every day | ORAL | 0 refills | Status: DC

## 2019-01-12 NOTE — Telephone Encounter (Signed)
[place] calling to report that orders were faxed 10/24/2018 11/12/2018 12/22/2018.  With no response. Ria Comment states that she did not see them in Epic either.  Ria Comment is reporting the  Fax was Home Health certification and plan of care date on it was 10/11/2018.  Clarita Crane to refax.  Ria Comment [phone]

## 2019-01-12 NOTE — Telephone Encounter (Signed)
Please below note

## 2019-01-12 NOTE — Patient Outreach (Signed)
[facility]) Care Management  01/12/2019  Breanna Webster Jan 24, 1946 [id number]  Telephone Assessment  RN spoke with pt's caregiver spouse Breanna Webster) who provides an update indicating pt is doing very well. States pt's speech is better, she is walking without an assistive device, showering independently and her balance has improved. States overall pt with great improvement. Caregiver verified pt is taking all her medications as prescribed and attending all scheduled appointments with sufficient transportation available. Pt has a very supportive family however spouse limits visit due to the ongoing pandemic to minimize there risk for both him and his wife.   RN review the plan of care with goals and interventions that have been adjusted accordingly. Will follow up next month with plans to discharge if pt continues to do progress with her mobility.  THN CM Care Plan Problem One     Most Recent Value  Care Plan Problem One  Recent hospitalization  Role Documenting the Problem One  Care Management [place] for Problem One  Active  THN Long Term Goal   Pt will not have readmission over the next 90 days related to stroke.  THN Long Term Goal Start Date  10/28/18  Interventions for Problem One Long Term Goal  Praise caregiver for working with pt on her limied mobility and continue to encouraged use of the available assistive device to prevent future falls as pt continue to progress with her ambulatory status. Will follow up next if pt continue to do well with graduating from the [facility] program. Will continue to encouraged limiting the risk for readmission by seek the available medical attention with any issues that may arise.  THN CM Short Term Goal #1   Adherence with medical appointments post hospital d/c over the next 30 days.  THN CM Short Term Goal #1 Start Date  10/28/18  THN CM Short Term Goal #1 Met Date  01/12/19  THN CM Short Term Goal #2   Adherence with  medication post hospital d/c over the next 30 days.  THN CM Short Term Goal #2 Start Date  10/28/18  [facility] CM Short Term Goal #2 Met Date  01/12/19       [name], RN Care Management Coordinator [place] Office [phone]

## 2019-01-13 DIAGNOSIS — Z7409 Other reduced mobility: Secondary | ICD-10-CM | POA: Diagnosis not present

## 2019-01-13 DIAGNOSIS — R2689 Other abnormalities of gait and mobility: Secondary | ICD-10-CM | POA: Diagnosis not present

## 2019-01-13 DIAGNOSIS — I69398 Other sequelae of cerebral infarction: Secondary | ICD-10-CM | POA: Diagnosis not present

## 2019-01-13 NOTE — Telephone Encounter (Signed)
There are no orders for this patient

## 2019-01-14 NOTE — Telephone Encounter (Signed)
Nortriptyline was d/c ok for losartan

## 2019-01-16 ENCOUNTER — Telehealth: Payer: Self-pay

## 2019-01-16 ENCOUNTER — Telehealth: Payer: Self-pay | Admitting: Gastroenterology

## 2019-01-16 NOTE — Telephone Encounter (Signed)
Patient called and is interested in scheduling an EGD and Colonoscopy, if her cardiologist will ok her being off her Eliquis. Please advise

## 2019-01-16 NOTE — Telephone Encounter (Signed)
Called patient back and spoke to husband, said she was napping, that he would have her call back.

## 2019-01-19 ENCOUNTER — Telehealth: Payer: Self-pay

## 2019-01-19 NOTE — Telephone Encounter (Signed)
Patient with diagnosis of afib on Eliquis for anticoagulation.    Procedure: Endo/Colon Date of procedure: 02/03/19  CHADS2-VASc score of  9 (CHF, HTN, AGE, DM2, stroke/tia x 2, CAD, AGE, female)  CrCl 52 ml/min  Patient is at high risk off anticoagulation due to hx of stroke secondary to either atrial fibrillation/flutter or embolization from mitral valve vegetation and a CHADS2-VASc score of  9. I will route to Dr. Tamala Julian for his input on length of hold. GI requesting a 2 day hold.

## 2019-01-19 NOTE — Telephone Encounter (Signed)
Chart reviewed. Looks like she is off Plavix now and on Eliquis. Okay to direct book for EGD and colonoscopy at the [facility] if okay with cardoilogy. She will need to hold Eliquis for 2 days prior to the exam. Thanks

## 2019-01-19 NOTE — Telephone Encounter (Signed)
Patient scheduled for Endo/Colon in [place] on 02/03/19 at 3:00pm. Pre visit scheduled 01/27/19 patient to arrive at 12:45pm. Request to hold Eliquis 2 days prior sent to Dr. Daneen Schick. Patient good with these appts.

## 2019-01-19 NOTE — Telephone Encounter (Signed)
Please comment on eliquis.

## 2019-01-19 NOTE — Telephone Encounter (Signed)
[facility] Pre-operative Risk Assessment     Request for surgical clearance:     Endoscopy Procedure  What type of surgery is being performed?     Endo/Colon  When is this surgery scheduled?     02/03/19  What type of clearance is required ?   Pharmacy  Are there any medications that need to be held prior to surgery and how long? Eliquis - 2 days  Practice name and name of physician performing surgery?      [facility]  What is your office phone and fax number?      Phone- [phone]  [phone]  Anesthesia type (None, local, MAC, general) ?       MAC

## 2019-01-20 DIAGNOSIS — Z23 Encounter for immunization: Secondary | ICD-10-CM | POA: Diagnosis not present

## 2019-01-20 NOTE — Telephone Encounter (Signed)
Okay to hold anticoagulation for 2 days as requested.

## 2019-01-21 ENCOUNTER — Telehealth: Payer: Self-pay

## 2019-01-21 NOTE — Telephone Encounter (Signed)
Tried X 2 to reach [place].  Received a recording both times that the # is not in service.

## 2019-01-21 NOTE — Telephone Encounter (Signed)
rec'd fax confirmation from [place] that pt is cleared to hold Eliquis for 2 days prior to procedure on 02-03-19. Called pt but no answer and no way to leave a message. Patient has previsit on 01-27-19.

## 2019-01-21 NOTE — Telephone Encounter (Signed)
Informed pt husband, [name] (ok per DPR), of recommendations for Eliquis. [name] verbalized understanding and thanks for the call.

## 2019-01-21 NOTE — Telephone Encounter (Signed)
   Primary Cardiologist: Sinclair Grooms, MD  Chart reviewed as part of pre-operative protocol coverage. Request received for guidance on holding anticoagulation of procedure, not medical clearance. Per our clinical pharmacist and Dr. Tamala Julian, [place] to hold eliquis for 2 days prior to procedure and restart a soon after when safe.  I will route this recommendation to the requesting party via Epic fax function and remove from pre-op pool.  Please call with questions.  [place], PA 01/21/2019, 8:02 AM

## 2019-01-22 DIAGNOSIS — R2689 Other abnormalities of gait and mobility: Secondary | ICD-10-CM | POA: Diagnosis not present

## 2019-01-22 DIAGNOSIS — Z7409 Other reduced mobility: Secondary | ICD-10-CM | POA: Diagnosis not present

## 2019-01-22 DIAGNOSIS — I69398 Other sequelae of cerebral infarction: Secondary | ICD-10-CM | POA: Diagnosis not present

## 2019-01-26 DIAGNOSIS — R2689 Other abnormalities of gait and mobility: Secondary | ICD-10-CM | POA: Diagnosis not present

## 2019-01-26 DIAGNOSIS — I69398 Other sequelae of cerebral infarction: Secondary | ICD-10-CM | POA: Diagnosis not present

## 2019-01-26 DIAGNOSIS — Z7409 Other reduced mobility: Secondary | ICD-10-CM | POA: Diagnosis not present

## 2019-01-27 ENCOUNTER — Ambulatory Visit (AMBULATORY_SURGERY_CENTER): Payer: Self-pay | Admitting: *Deleted

## 2019-01-27 ENCOUNTER — Other Ambulatory Visit: Payer: Self-pay

## 2019-01-27 VITALS — Temp 97.3°F | Ht 65.0 in | Wt 193.4 lb

## 2019-01-27 DIAGNOSIS — E039 Hypothyroidism, unspecified: Secondary | ICD-10-CM | POA: Diagnosis not present

## 2019-01-27 DIAGNOSIS — Z86010 Personal history of colonic polyps: Secondary | ICD-10-CM

## 2019-01-27 DIAGNOSIS — Z8719 Personal history of other diseases of the digestive system: Secondary | ICD-10-CM

## 2019-01-27 LAB — BASIC METABOLIC PANEL: Glucose: 206

## 2019-01-27 MED ORDER — NA SULFATE-K SULFATE-MG SULF 17.5-3.13-1.6 GM/177ML PO SOLN: 1.0000 | Freq: Once | ORAL | 0 refills | Status: AC

## 2019-01-27 NOTE — Progress Notes (Signed)
No egg or soy allergy known to patient  No issues with past sedation with any surgeries  or procedures, no intubation problems  No diet pills per patient No home 02 use per patient  No blood thinners per patient  Pt denies issues with constipation  No A fib or A flutter  EMMI video sent to pt's e mail   Due to the COVID-19 pandemic we are asking patients to follow these guidelines. Please only bring one care partner. Please be aware that your care partner may wait in the car in the parking lot or if they feel like they will be too hot to wait in the car, they may wait in the lobby on the 4th floor. All care partners are required to wear a mask the entire time (we do not have any that we can provide them), they need to practice social distancing, and we will do a Covid check for all patient's and care partners when you arrive. Also we will check their temperature and your temperature. If the care partner waits in their car they need to stay in the parking lot the entire time and we will call them on their cell phone when the patient is ready for discharge so they can bring the car to the front of the building. Also all patient's will need to wear a mask into building.

## 2019-01-29 DIAGNOSIS — I69398 Other sequelae of cerebral infarction: Secondary | ICD-10-CM | POA: Diagnosis not present

## 2019-01-29 DIAGNOSIS — R2689 Other abnormalities of gait and mobility: Secondary | ICD-10-CM | POA: Diagnosis not present

## 2019-01-29 DIAGNOSIS — Z7409 Other reduced mobility: Secondary | ICD-10-CM | POA: Diagnosis not present

## 2019-02-02 ENCOUNTER — Telehealth: Payer: Self-pay

## 2019-02-02 NOTE — Telephone Encounter (Signed)
Covid-19 screening questions   Do you now or have you had a fever in the last 14 days?  Do you have any respiratory symptoms of shortness of breath or cough now or in the last 14 days?  Do you have any family members or close contacts with diagnosed or suspected Covid-19 in the past 14 days?  Have you been tested for Covid-19 and found to be positive?       

## 2019-02-03 ENCOUNTER — Encounter: Payer: Self-pay | Admitting: Gastroenterology

## 2019-02-03 ENCOUNTER — Ambulatory Visit (AMBULATORY_SURGERY_CENTER): Payer: Medicare Other | Admitting: Gastroenterology

## 2019-02-03 ENCOUNTER — Other Ambulatory Visit: Payer: Self-pay

## 2019-02-03 VITALS — BP 120/64 | HR 71 | Temp 97.4°F | Resp 16 | Ht 65.0 in | Wt 193.0 lb

## 2019-02-03 DIAGNOSIS — K2270 Barrett's esophagus without dysplasia: Secondary | ICD-10-CM | POA: Diagnosis not present

## 2019-02-03 DIAGNOSIS — K2980 Duodenitis without bleeding: Secondary | ICD-10-CM | POA: Diagnosis not present

## 2019-02-03 DIAGNOSIS — D124 Benign neoplasm of descending colon: Secondary | ICD-10-CM | POA: Diagnosis not present

## 2019-02-03 DIAGNOSIS — D122 Benign neoplasm of ascending colon: Secondary | ICD-10-CM | POA: Diagnosis not present

## 2019-02-03 DIAGNOSIS — K219 Gastro-esophageal reflux disease without esophagitis: Secondary | ICD-10-CM | POA: Diagnosis not present

## 2019-02-03 DIAGNOSIS — Z86010 Personal history of colonic polyps: Secondary | ICD-10-CM | POA: Diagnosis not present

## 2019-02-03 DIAGNOSIS — D123 Benign neoplasm of transverse colon: Secondary | ICD-10-CM

## 2019-02-03 DIAGNOSIS — K317 Polyp of stomach and duodenum: Secondary | ICD-10-CM | POA: Diagnosis not present

## 2019-02-03 MED ORDER — SODIUM CHLORIDE 0.9 % IV SOLN: 500.0000 mL | Freq: Once | INTRAVENOUS | Status: DC

## 2019-02-03 NOTE — Progress Notes (Signed)
Pt's states no medical or surgical changes since previsit or office visit. 

## 2019-02-03 NOTE — Patient Instructions (Signed)
Handout provided on Polyps.  All polyps removed have been sent off for pathology and the results will take 10-14 days to receive.  You may resume your Eliquis on 1014/2020.  You may resume your diet and other medication schedule today.  YOU HAD AN ENDOSCOPIC PROCEDURE TODAY AT [facility]:   Refer to the procedure report that was given to you for any specific questions about what was found during the examination.  If the procedure report does not answer your questions, please call your gastroenterologist to clarify.  If you requested that your care partner not be given the details of your procedure findings, then the procedure report has been included in a sealed envelope for you to review at your convenience later.  YOU SHOULD EXPECT: Some feelings of bloating in the abdomen. Passage of more gas than usual.  Walking can help get rid of the air that was put into your GI tract during the procedure and reduce the bloating. If you had a lower endoscopy (such as a colonoscopy or flexible sigmoidoscopy) you may notice spotting of blood in your stool or on the toilet paper. If you underwent a bowel prep for your procedure, you may not have a normal bowel movement for a few days.  Please Note:  You might notice some irritation and congestion in your nose or some drainage.  This is from the oxygen used during your procedure.  There is no need for concern and it should clear up in a day or so.  SYMPTOMS TO REPORT IMMEDIATELY:   Following lower endoscopy (colonoscopy or flexible sigmoidoscopy):  Excessive amounts of blood in the stool  Significant tenderness or worsening of abdominal pains  Swelling of the abdomen that is new, acute  Fever of 100F or higher  For urgent or emergent issues, a gastroenterologist can be reached at any hour by calling [phone].   DIET:  We do recommend a small meal at first, but then you may proceed to your regular diet.  Drink plenty of fluids but  you should avoid alcoholic beverages for 24 hours.  ACTIVITY:  You should plan to take it easy for the rest of today and you should NOT DRIVE or use heavy machinery until tomorrow (because of the sedation medicines used during the test).    FOLLOW UP: Our staff will call the number listed on your records 48-72 hours following your procedure to check on you and address any questions or concerns that you may have regarding the information given to you following your procedure. If we do not reach you, we will leave a message.  We will attempt to reach you two times.  During this call, we will ask if you have developed any symptoms of COVID 19. If you develop any symptoms (ie: fever, flu-like symptoms, shortness of breath, cough etc.) before then, please call [phone].  If you test positive for Covid 19 in the 2 weeks post procedure, please call and report this information to [name].    If any biopsies were taken you will be contacted by phone or by letter within the next 1-3 weeks.  Please call us at [phone] if you have not heard about the biopsies in 3 weeks.    SIGNATURES/CONFIDENTIALITY: You and/or your care partner have signed paperwork which will be entered into your electronic medical record.  These signatures attest to the fact that that the information above on your After Visit Summary has been reviewed and is understood.  Full responsibility of the confidentiality of this discharge information lies with you and/or your care-partner.

## 2019-02-03 NOTE — Progress Notes (Signed)
Called to room to assist during endoscopic procedure.  Patient ID and intended procedure confirmed with present staff. Received instructions for my participation in the procedure from the performing physician.  

## 2019-02-03 NOTE — Op Note (Signed)
[place] Patient Name: Breanna Webster Procedure Date: 02/03/2019 2:36 PM MRN: [id number] Endoscopist: Remo Lipps P. Havery Moros , MD Age: 73 Referring MD:  Date of Birth: [birth year] Gender: Female Account #: [id number] Procedure:                Upper GI endoscopy Indications:              Follow-up of Barrett's esophagus / LA grade B                            esophagitis on last EGD - now on protonix 28m once                            daily, with good control of symptoms Medicines:                Monitored Anesthesia Care Procedure:                Pre-Anesthesia Assessment:                           - Prior to the procedure, a History and Physical                            was performed, and patient medications and                            allergies were reviewed. The patient's tolerance of                            previous anesthesia was also reviewed. The risks                            and benefits of the procedure and the sedation                            options and risks were discussed with the patient.                            All questions were answered, and informed consent                            was obtained. Prior Anticoagulants: The patient has                            taken Eliquis (apixaban), last dose was 2 days                            prior to procedure. ASA Grade Assessment: III - A                            patient with severe systemic disease. After                            reviewing the risks and benefits, the patient was  deemed in satisfactory condition to undergo the                            procedure.                           After obtaining informed consent, the endoscope was                            passed under direct vision. Throughout the                            procedure, the patient's blood pressure, pulse, and                            oxygen saturations were monitored continuously.  The                            Endoscope was introduced through the mouth, and                            advanced to the second part of duodenum. The upper                            GI endoscopy was accomplished without difficulty.                            The patient tolerated the procedure well. Scope In: Scope Out: Findings:                 The Z-line was irregular and was found 36 cm from                            the incisors - extension of salmon colored mucosa                            anywhere from 54m to 181mcircumferentially above                            the GEJ. Biopsies were taken with a cold forceps                            for histology.                           The exam of the esophagus was otherwise normal.                            Interval healing of esophagitis on protonix.                           Evidence of a Nissen fundoplication was found in                            the cardia.  The wrap appeared loose.                           The exam of the stomach was otherwise normal.                           A single 3 mm sessile polyp was found in the                            duodenal bulb. The polyp was removed with a cold                            biopsy forceps. Resection and retrieval were                            complete.                           The exam of the duodenum was otherwise normal. Complications:            No immediate complications. Estimated blood loss:                            Minimal. Estimated Blood Loss:     Estimated blood loss was minimal. Impression:               - Z-line irregular, 36 cm from the incisors.                            Biopsied.                           - Normal esophagus otherwise                           - A Nissen fundoplication was found. The wrap                            appears loose.                           - Normal stomach otherwise.                           - A single duodenal polyp.  Resected and retrieved.                           - Normal duodenum otherwise. Recommendation:           - Patient has a contact number available for                            emergencies. The signs and symptoms of potential                            delayed complications were discussed with the  patient. Return to normal activities tomorrow.                            Written discharge instructions were provided to the                            patient.                           - Resume previous diet.                           - Continue present medications.                           - Resume Eliquis tomorrow per colonoscopy note.                           - Await pathology results. Remo Lipps P. [name], MD 02/03/2019 3:43:48 PM This report has been signed electronically.

## 2019-02-03 NOTE — Telephone Encounter (Signed)
Follow up   Breanna Webster is calling to follow up on receiving this order back. Please fax to [phone]. Please call.

## 2019-02-03 NOTE — Progress Notes (Signed)
PT taken to PACU. Monitors in place. VSS. Report given to RN. 

## 2019-02-03 NOTE — Telephone Encounter (Signed)
Spoke with Breanna Webster and made him aware Dr. Tamala Julian was out of the office Friday and Monday but fax was received.  Advised I will have him sign it today and we will fax it over.  [name] appreciative for call.

## 2019-02-03 NOTE — Op Note (Signed)
[place] Patient Name: Breanna Webster Procedure Date: 02/03/2019 2:35 PM MRN: [id number] Endoscopist: Remo Lipps P. Havery Moros , MD Age: 73 Referring MD:  Date of Birth: Sep 23, 1945 Gender: Female Account #: [id number] Procedure:                Colonoscopy Indications:              Surveillance: History of numerous (28) adenomas on                            last colonoscopy (2018) Medicines:                Monitored Anesthesia Care Procedure:                Pre-Anesthesia Assessment:                           - Prior to the procedure, a History and Physical                            was performed, and patient medications and                            allergies were reviewed. The patient's tolerance of                            previous anesthesia was also reviewed. The risks                            and benefits of the procedure and the sedation                            options and risks were discussed with the patient.                            All questions were answered, and informed consent                            was obtained. Prior Anticoagulants: The patient has                            taken Eliquis (apixaban), last dose was 2 days                            prior to procedure. ASA Grade Assessment: III - A                            patient with severe systemic disease. After                            reviewing the risks and benefits, the patient was                            deemed in satisfactory condition to undergo the  procedure.                           After obtaining informed consent, the colonoscope                            was passed under direct vision. Throughout the                            procedure, the patient's blood pressure, pulse, and                            oxygen saturations were monitored continuously. The                            Colonoscope was introduced through the anus and               advanced to the the cecum, identified by                            appendiceal orifice and ileocecal valve. The                            colonoscopy was performed without difficulty. The                            patient tolerated the procedure well. The quality                            of the bowel preparation was adequate. The                            ileocecal valve, appendiceal orifice, and rectum                            were photographed. Scope In: 2:54:05 PM Scope Out: 3:31:42 PM Scope Withdrawal Time: 0 hours 24 minutes 31 seconds  Total Procedure Duration: 0 hours 37 minutes 37 seconds  Findings:                 The perianal and digital rectal examinations were                            normal.                           A 3 mm polyp was found in the ascending colon. The                            polyp was sessile. The polyp was removed with a                            cold snare. Resection and retrieval were complete.                           There was  a medium-sized lipoma, in the ascending                            colon.                           Four sessile polyps were found in the transverse                            colon. The polyps were 3 to 6 mm in size. These                            polyps were removed with a cold snare. Resection                            and retrieval were complete.                           Three sessile polyps were found in the descending                            colon. The polyps were 3 to 4 mm in size. These                            polyps were removed with a cold snare. Resection                            and retrieval were complete.                           Multiple medium-mouthed diverticula were found in                            the entire colon.                           The colon was tortuous.                           There was spasm in the entire colon which prolonged                             the procedure.                           The exam was otherwise without abnormality. Complications:            No immediate complications. Estimated blood loss:                            Minimal. Estimated Blood Loss:     Estimated blood loss was minimal. Impression:               - One 3 mm polyp in the ascending colon, removed  with a cold snare. Resected and retrieved.                           - Medium-sized lipoma in the ascending colon.                           - Four 3 to 6 mm polyps in the transverse colon,                            removed with a cold snare. Resected and retrieved.                           - Three 3 to 4 mm polyps in the descending colon,                            removed with a cold snare. Resected and retrieved.                           - Diverticulosis in the entire examined colon.                           - Tortuous colon.                           - Colonic spasm.                           - The examination was otherwise normal. Recommendation:           - Patient has a contact number available for                            emergencies. The signs and symptoms of potential                            delayed complications were discussed with the                            patient. Return to normal activities tomorrow.                            Written discharge instructions were provided to the                            patient.                           - Resume previous diet.                           - Continue present medications.                           - Resume Eliquis tomorrow.                           -  Await pathology results. Remo Lipps P. [name], MD 02/03/2019 3:38:44 PM This report has been signed electronically.

## 2019-02-05 ENCOUNTER — Telehealth: Payer: Self-pay

## 2019-02-05 DIAGNOSIS — E1165 Type 2 diabetes mellitus with hyperglycemia: Secondary | ICD-10-CM | POA: Diagnosis not present

## 2019-02-05 DIAGNOSIS — Z6831 Body mass index (BMI) 31.0-31.9, adult: Secondary | ICD-10-CM | POA: Diagnosis not present

## 2019-02-05 DIAGNOSIS — I4891 Unspecified atrial fibrillation: Secondary | ICD-10-CM | POA: Diagnosis not present

## 2019-02-05 DIAGNOSIS — E039 Hypothyroidism, unspecified: Secondary | ICD-10-CM | POA: Diagnosis not present

## 2019-02-05 DIAGNOSIS — Z952 Presence of prosthetic heart valve: Secondary | ICD-10-CM | POA: Diagnosis not present

## 2019-02-05 DIAGNOSIS — E1149 Type 2 diabetes mellitus with other diabetic neurological complication: Secondary | ICD-10-CM | POA: Diagnosis not present

## 2019-02-05 DIAGNOSIS — I2510 Atherosclerotic heart disease of native coronary artery without angina pectoris: Secondary | ICD-10-CM | POA: Diagnosis not present

## 2019-02-05 DIAGNOSIS — Z794 Long term (current) use of insulin: Secondary | ICD-10-CM | POA: Diagnosis not present

## 2019-02-05 DIAGNOSIS — Z8673 Personal history of transient ischemic attack (TIA), and cerebral infarction without residual deficits: Secondary | ICD-10-CM | POA: Diagnosis not present

## 2019-02-05 NOTE — Telephone Encounter (Signed)
  Follow up Call-  Call back number 02/03/2019 03/20/2017  Post procedure Call Back phone  # [phone]  Permission to leave phone message Yes Yes  Some recent data might be hidden     Patient questions:  Do you have a fever, pain , or abdominal swelling? No. Pain Score  0 *  Have you tolerated food without any problems? Yes.    Have you been able to return to your normal activities? No.  Do you have any questions about your discharge instructions: Diet   No. Medications  No. Follow up visit  No.  Do you have questions or concerns about your Care? No.  Actions: * If pain score is 4 or above: No action needed, pain <4. 1. Have you developed a fever since your procedure? no  2.   Have you had an respiratory symptoms (SOB or cough) since your procedure? no  3.   Have you tested positive for COVID 19 since your procedure no  4.   Have you had any family members/close contacts diagnosed with the COVID 19 since your procedure?  no   If yes to any of these questions please route to Joylene John, RN and Alphonsa Gin, Therapist, sports.

## 2019-02-05 NOTE — Telephone Encounter (Signed)
  Follow up Call-  Call back number 02/03/2019 03/20/2017  Post procedure Call Back phone  # [phone]  Permission to leave phone message Yes Yes  Some recent data might be hidden     Left message

## 2019-02-06 ENCOUNTER — Encounter: Payer: Self-pay | Admitting: Family Medicine

## 2019-02-06 DIAGNOSIS — Z7409 Other reduced mobility: Secondary | ICD-10-CM | POA: Diagnosis not present

## 2019-02-06 DIAGNOSIS — R2689 Other abnormalities of gait and mobility: Secondary | ICD-10-CM | POA: Diagnosis not present

## 2019-02-06 DIAGNOSIS — I69398 Other sequelae of cerebral infarction: Secondary | ICD-10-CM | POA: Diagnosis not present

## 2019-02-09 ENCOUNTER — Other Ambulatory Visit: Payer: Self-pay | Admitting: Endocrinology

## 2019-02-10 ENCOUNTER — Other Ambulatory Visit: Payer: Self-pay | Admitting: *Deleted

## 2019-02-10 DIAGNOSIS — Z7409 Other reduced mobility: Secondary | ICD-10-CM | POA: Diagnosis not present

## 2019-02-10 DIAGNOSIS — I69398 Other sequelae of cerebral infarction: Secondary | ICD-10-CM | POA: Diagnosis not present

## 2019-02-10 DIAGNOSIS — R2689 Other abnormalities of gait and mobility: Secondary | ICD-10-CM | POA: Diagnosis not present

## 2019-02-10 NOTE — Patient Outreach (Signed)
[facility]) Care Management  02/10/2019  Breanna Webster August 04, 1945 [id number]    Telephone Assessment-Successful   RN spoke with pt's spouse [name]) who continues to update on pt's progress. States pt continues to do well with 90% of her speech back, 80% of her balance returned and pt continues to work with PT (next appointment this afternoon). Confirms pt taking all her prescribed medications organzised by him and pt has not missed any appointments since the last conversation. Pt currently not available due to an appointment at the beauty salon. Spouse states she is allowed more outings however very careful with crowded placed to avoid due to the ongoing pandemic.   Plan of care discussed and updated according to pt's progress. Will continue to stress the importance of early interventions and what to do if acute symptoms should occur. Will update accordingly and offer to follow quarterly based upon pt's progress. Will update pt's provider on quarterly assessment with the focus on ongoing prevention measures. Will remind caregiver of ongoing availability of pharmacy and social workers with [facility] if additional tools or referrals are needed as he continue to care for his wife. Next follow in January however available prior to if needed.   [facility] CM Care Plan Problem One     Most Recent Value  Care Plan Problem One  Recent hospitalization  Role Documenting the Problem One  Care Management [place] for Problem One  Active  [facility] Long Term Goal   Pt will not have readmission over the next 90 days related to stroke.  [facility] Long Term Goal Start Date  10/28/18  Interventions for Problem One Long Term Goal  Will verify pt continue to do well progressing with her balance with ongoing PT and speech has improved. Will continue to encourage participation withher ongoing progress and verify pt remains asymptomatic related to signs and symptoms of a stroke. Will continue hospital  prevention measures.      [name], RN Care Management Coordinator [place] Office [phone]

## 2019-02-11 ENCOUNTER — Ambulatory Visit (INDEPENDENT_AMBULATORY_CARE_PROVIDER_SITE_OTHER): Payer: Medicare Other

## 2019-02-11 ENCOUNTER — Encounter: Payer: Self-pay | Admitting: Internal Medicine

## 2019-02-11 ENCOUNTER — Ambulatory Visit (INDEPENDENT_AMBULATORY_CARE_PROVIDER_SITE_OTHER): Payer: Medicare Other | Admitting: Internal Medicine

## 2019-02-11 ENCOUNTER — Other Ambulatory Visit: Payer: Self-pay

## 2019-02-11 DIAGNOSIS — I639 Cerebral infarction, unspecified: Secondary | ICD-10-CM

## 2019-02-11 DIAGNOSIS — R05 Cough: Secondary | ICD-10-CM

## 2019-02-11 DIAGNOSIS — R0600 Dyspnea, unspecified: Secondary | ICD-10-CM | POA: Diagnosis not present

## 2019-02-11 DIAGNOSIS — R0609 Other forms of dyspnea: Secondary | ICD-10-CM

## 2019-02-11 DIAGNOSIS — R058 Other specified cough: Secondary | ICD-10-CM

## 2019-02-11 DIAGNOSIS — R0602 Shortness of breath: Secondary | ICD-10-CM | POA: Diagnosis not present

## 2019-02-11 NOTE — Patient Instructions (Addendum)
Ok to try chlorpheniramine 4 mg x 2 at bedtime   Monitor your 02 level at peak exercise by pulse oximetry when available    Please remember to go to the  x-ray department  for your tests - we will call you with the results when they are available    Please schedule a follow up visit in 3 months but call sooner if needed  with all medications /inhalers/ solutions in hand so we can verify exactly what you are taking. This includes all medications from all doctors and over the counters

## 2019-02-11 NOTE — Progress Notes (Signed)
Subjective:     Patient ID: Breanna Webster, female   DOB: April 23, 1946     MRN: [id number]    Brief patient profile:  63  yowf never smoker with pnds as child allergy tested as child doesn't know details, never took shots,  outgrew completely then symptoms resumed in 90's after moved to [place] from [facility] with coughing that improved p NF late 90s  but still had drainage variably  severe no pattern as to time of year then much worse since summer 2016 with onset of cough/wheeze May 2017 referred to pulmonary clinic 10/14/2015 by Dr Betty [name].      History of Present Illness  10/14/2015 1st [facility] office visit/ Neil Errickson   Chief Complaint  Patient presents with  . Pulmonary Consult    Referred by Dr. Betty [name]. Pt c/o cough and wheezing x 5 wks. Cough is occ prod with minimal green sputum.   acute onset cough > green mucus one week p husband's uri May 2017 >  rx with abx/ prednisone improved then worse 2 days p finished but mostly productive  white mucus.  If doesn't take the codeine cough med then coughs worse at hs.  No assoc sob/ some wheeze better with saba  rec The key to effective treatment for your cough is eliminating the non-stop cycle of cough you're stuck in long enough to let your airway heal completely and then see if there is anything still making you cough once you stop the cough suppression, but this should take no more than 5 days to figure out Eliminate all coughing for 3 days with your codiene syrup and then switch delsym 2 tsp every 12 hours  Prednisone 10 mg take  4 each am x 2 days,   2 each am x 2 days,  1 each am x 2 days and stop (this is to eliminate allergies and inflammation from coughing) Protonix (pantoprazole) Take 30-60 min before first meal of the day and Pepcid 20 mg one bedtime plus chlorpheniramine 4 mg x 2 at bedtime (both available over the counter)  until cough is completely gone for at least a week without the need for cough suppression GERD  diet    11/02/2015 acute extended ov/Charnell Peplinski re: cough since may 2017   Chief Complaint  Patient presents with  . Pulmonary Consult    Cough has only improved some. She is still wheezing and her cough is prod with green sputum.    worse in am and at bedtime and slt green mucus again p initially turning white  rec Please see patient coordinator before you leave today  to schedule sinus CT and we will call results  For drainage / throat tickle try take CHLORPHENIRAMINE  4 mg - take one every 4 hours as needed -  Please remember to go to the lab  department downstairs for your tests - we will call you with the results when they are available. Until cough gone 100% with no need for any cough medicine > prilosec or pantoprazole 40 mg proton pump inhibitor and continue pepcid 20 mg at hs Take delsym two tsp every 12 hours and supplement if needed with tylenol #3  up to 2 every 4 hours to suppress the urge to cough.  Once you have eliminated the cough for 3 straight days try reducing the tylenol #3  first,  then the delsym as tolerated.   Stop toprol (lopressor/metaprolol)  And start bisoprolol 5 mg one daily  12/13/2015  f/u ov/Tamala Manzer re: cyclical cough/ gerd Chief Complaint  Patient presents with  . Follow-up    Cough has resolved completely and no more wheezing for the past month. No new co's.    no cough at all/ on no gerd meds/ on bisoprolol instead of metaprolol and doing great  rec Remember at the onset of any respiratory complaint :  Try prilosec otc 87m  Take 30-60 min before first meal of the day and Pepcid ac (famotidine) 20 mg one @  bedtime until completely better for a week For drainage / throat tickle try take CHLORPHENIRAMINE  4 mg - take one every 4 hours as needed -          07/19/2016 acute extended ov/Maleni Seyer re: sob no longer on gerd rx "I don't have any HB"  Chief Complaint  Patient presents with  . Acute Visit    Increased SOB x 3 months. She states that she gets  SOB with any exertion at all.    new problem =  Sob with exertion lifelong always the last one off the playground and trouble with steps and hills as long as she can recall Baseline = walking up to 45 min flat at [employer] [facility] s stopping  and 30 min on bicycle at "moderate pace and resistance"  Took some time off due to back pain and now can't walk 5 min with back pain and sob with back pain so severe today in w/c and pain shooting down R leg to foot s/p failed injections so far Never on inhalers  rec Nexium 40 (or prilosec 20 x 2) Take 30- 60 min before your first and last meals of the day  GERD  Diet     03/20/17 EGD [name]:  Loose NF, Barrett's    11/11/2017  Extended f/u ov/Ranveer Wahlstrom re: doe/ recurrent cough on gerd rx  Chief Complaint  Patient presents with  . Follow-up    Pt states she still has the cough. Pt states she will have spasms that will bring on her cough. Pt also has complaints of SOB with exertion and has chest tightness.  Dyspnea:  MMRC3 = can't walk 100 yards even at a slow pace at a flat grade s stopping due to sob  - only able to shop by pushing cart around "anywhere" but with freq stops  Cough: dry sporadic daytime / eating and talking seem to trigger it mostly  rec  To get the most out of exercise, you need to be continuously aware that you are short of breath, but never out of breath, for 30 minutes daily. As you improve, it will actually be easier for you to do the same amount of exercise  in  30 minutes so always push to the level where you are short of breath.    We will arrange a cpst in one month no sooner and I will call you with the results needs repeat echo prior to  CPST >  Pos AS     W/u for AS  02/20/18 LHC/RHC  Mean gradient  18 with wedge 18  and mod PH wih PVR 294 > ONO RA ok/ v/q nl     03/13/2018  Extended  f/u ov/Machaela Caterino re: doe/ chronic cough? ? PMerwin  Chief Complaint  Patient presents with  . Follow-up    Cough still present and  getting worse - productive clear most of the time.   Dyspnea:  One half block then  has to stop due to cough and sob  Cough: daytime / variable during the day,  Up to a tbsp clear/ gag but no vomit Sleeping: flat with 2 pillows  SABA use: none 02: no  Records show best response was to cyclical cough regimen which has never been repeated since 11/02/15  rec If you are asked to stop the aspirin for any reason check with Dr Tamala Julian There is no pulmonary reason you cannot have dental surgery at this point For drainage / throat tickle try take CHLORPHENIRAMINE  4 mg (chlortabs 4 mg from [facility]) - take one every 4 hours as needed - available over the counter- may cause drowsiness so start with just a bedtime dose or two and see how you tolerate it before trying in daytime. GERD (REFLUX)   Please schedule a follow up office visit in 6 weeks, call sooner if needed with all medications /inhalers/ solutions in hand so we can verify exactly what you are taking. This includes all medications from all doctors and over the counters  - add full pfts on return - move up to 2 weeks and add tyl #3 in meantime if still can't stop coughing   04/08/2018  f/u ov/Claus Silvestro re:  Doe  Chief Complaint  Patient presents with  . Follow-up    PFT done today. Her breathing has improved and she is coughing less but is not back to her normal baseline.   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 The left ventricle has normal systolic function, with an ejection fraction of 60-65%. The cavity size was normal. There is moderately increased left ventricular wall thickness. Left ventricular diastolic  Doppler parameters are consistent with  impaired relaxation. Elevated left ventricular end-diastolic pressure. Nl LA GI diastolic dysfunction   2. The right ventricle has normal systolc function. The cavity was normal. There is no increase in right ventricular wall thickness. Right ventricular systolic pressure could not be assessed.  3. The mitral valve is degenerative. Mild thickening of the mitral valve leaflet. There is severe mitral annular calcification present. Mild mitral valve stenosis.  4. Pulmonic valve regurgitation was not assessed by color flow Doppler.  5. - TAVR: S/P TAVR that appears to be functioning normally. There is no perivavluarl AR. The mean AVG is normal at 52mHg. LVOT/AV VTI ratio:0.54. AV Vmax:279.00 cm/s.    02/11/2019  exteneded f/u ov/Kiyah Demartini re: doe "I'm no better since surgery"  - did not bring meds, has high tsh not being followed in epic and balance issues since CVA Chief Complaint  Patient presents with  . Follow-up    Pt states she had a stroke in May 2020 and she gets SOB doing PT.   Dyspnea:  Walking x 15 min slow pace (not prior to surgery could walk "maybe a block" Cough: gone completely  Sleeping: 6 in bedblock no resp symptoms but insomnia p elavil and one h1  SABA use: none 02: none   No obvious day to day or daytime variability or assoc excess/ purulent sputum or mucus plugs or hemoptysis or  cp or chest tightness, subjective wheeze or overt sinus or hb symptoms.   Sleeping as above  without nocturnal  or early am exacerbation  of respiratory  c/o's or need for noct saba. Also denies any obvious fluctuation of symptoms with weather or environmental changes or other aggravating or alleviating factors except as outlined above   No unusual exposure hx or h/o childhood pna/ asthma or knowledge of premature birth.  Current Allergies, Complete Past Medical History, Past Surgical History, Family History, and Social History were reviewed in [facility] record.  ROS  The following are not active complaints unless bolded Hoarseness, sore throat, dysphagia, dental problems, itching, sneezing,  nasal congestion or discharge of excess mucus or purulent secretions, ear ache,   fever, chills, sweats, unintended wt loss or wt gain, classically pleuritic or exertional cp,  orthopnea pnd or arm/hand swelling  or leg swelling, presyncope, palpitations, abdominal pain, anorexia, nausea, vomiting, diarrhea  or change in bowel habits or change in bladder habits, change in stools or change in urine, dysuria, hematuria,  rash, arthralgias, visual complaints, headache, numbness, weakness or ataxia or problems with walking or coordination,  change in mood or  memory.        Current Meds  Medication Sig  . amitriptyline (ELAVIL) 25 MG tablet TAKE 1 TABLET(25 MG) BY MOUTH AT BEDTIME  . apixaban (ELIQUIS) 5 MG TABS tablet Take 1 tablet (5 mg total) by mouth 2 (two) times daily.  Marland Kitchen aspirin 81 MG tablet Take 81 mg by mouth daily.    . BD INSULIN SYRINGE U/F 31G X 5/16" 1 ML MISC USE TO INJECT INSULIN EVERY DAY  . chlorpheniramine (CHLOR-TRIMETON) 4 MG tablet Take 4 mg by mouth at bedtime.   . cholecalciferol (VITAMIN D) 1000 units tablet Take 1,000 Units by mouth daily.  Marland Kitchen diltiazem (CARDIZEM CD) 120 MG 24 hr capsule TAKE 1 CAPSULE(120 MG) BY MOUTH DAILY  . escitalopram (LEXAPRO) 20 MG tablet TAKE 1 TABLET BY MOUTH EVERY DAY  . hydrochlorothiazide (HYDRODIURIL) 12.5 MG tablet Take 1 tablet (12.5 mg total) by mouth daily.  . insulin NPH Human (HUMULIN N) 100 UNIT/ML injection Inject 1 mL (100 Units total) into the skin daily before breakfast.  . Lancets (ONETOUCH DELICA PLUS [id number]) MISC USE TO CHECK BLOOD SUGAR TWICE DAILY..  . levothyroxine (SYNTHROID) 175 MCG tablet Take 1 tablet (175 mcg total) by mouth daily before breakfast. MUST CALL TO SCHEDULE APPT  . losartan (COZAAR) 50 MG tablet TAKE 1 TABLET(50 MG) BY MOUTH DAILY  . Melatonin 5 MG  TABS Take by mouth.  . metFORMIN (GLUCOPHAGE) 500 MG tablet Take 500 mg by mouth 2 (two) times daily with a meal.   . Multiple Vitamin (MULITIVITAMIN WITH MINERALS) TABS Take 1 tablet by mouth daily.  . nitroGLYCERIN (NITROSTAT) 0.4 MG SL tablet Place 1 tablet (0.4 mg total) under the tongue every 5 (five) minutes as needed for chest pain.  [name] VERIO test strip USE TO MONITOR GLUCOSE LEVELS TWICE DAILY  . pantoprazole (PROTONIX) 40 MG tablet Take 1 tablet (40 mg total) by mouth 2 (two) times daily.                     Objective:   Physical Exam   amb obese wf   02/11/2019      192  04/08/2018      204  03/13/2018      202  11/11/2017  204  07/19/2016        196 per pt  12/13/2015       193   11/02/2015       193  10/14/15 192 lb (87.091 kg)  10/10/15 190 lb (86.183 kg)  10/07/15 189 lb 4 oz (85.843 kg)     Obese wf nad    Vital signs reviewed - Note on arrival 02 sats  96% on RA     HEENT : pt wearing mask not removed for exam due to covid -19 concerns.    NECK :  without JVD/Nodes/TM/ nl carotid upstrokes bilaterally   LUNGS: no acc muscle use,  Nl contour chest which is clear to A and P bilaterally without cough on insp or exp maneuvers   CV:  RRR  no s3 or murmur or increase in P2, and no edema   ABD:  soft and nontender with nl inspiratory excursion in the supine position. No bruits or organomegaly appreciated, bowel sounds nl  MS:  Nl gait/ ext warm without deformities, calf tenderness, cyanosis or clubbing No obvious joint restrictions   SKIN: warm and dry without lesions    NEURO:  alert, approp, nl sensorium with  no motor or cerebellar deficits apparent.            CXR PA and Lateral:   02/11/2019 :    I personally reviewed images and agree with radiology impression as follows:    1. No focal pneumonia 2. Slight thickening of central airways, possible reactive airways or interstitial inflammatory process. My review:  Mild T kyhosis,  non specific min  increased lung marking       Assessment:

## 2019-02-12 ENCOUNTER — Encounter: Payer: Self-pay | Admitting: Internal Medicine

## 2019-02-12 DIAGNOSIS — I69398 Other sequelae of cerebral infarction: Secondary | ICD-10-CM | POA: Diagnosis not present

## 2019-02-12 DIAGNOSIS — R2689 Other abnormalities of gait and mobility: Secondary | ICD-10-CM | POA: Diagnosis not present

## 2019-02-12 DIAGNOSIS — Z7409 Other reduced mobility: Secondary | ICD-10-CM | POA: Diagnosis not present

## 2019-02-12 NOTE — Progress Notes (Signed)
LMTCB

## 2019-02-12 NOTE — Assessment & Plan Note (Signed)
Onset 04/2006  - [facility] 02/20/18  PA mean = 41 with wedge 18 / CO 6.24  So PVR = 294 (nl < 250)  V/q lung scan neg for PE and not suggestive of PH  - 03/13/2018   Walked RA  2 laps @210  ft each stopped due to end of study, mod fast pace, min sob, no desats, started coughing at end PFT's  04/08/2018  FEV1 2.00 (89 % ) ratio 82  p 1 % improvement from saba p nothing  prior to study with DLCO  52 % corrects to 76  % for alv volume  And no significant curvature   -S/p TAVR 06/2018  - MV endocarditis 09/2018   Echo 12/02/2018 1. The left ventricle has normal systolic function, with an ejection fraction of 60-65%. The cavity size was normal. There is moderately increased left ventricular wall thickness. Left ventricular diastolic Doppler parameters are consistent with  impaired relaxation. Elevated left ventricular end-diastolic pressure. Nl LA GI diastolic dysfunction   2. The right ventricle has normal systolc function. The cavity was normal. There is no increase in right ventricular wall thickness. Right ventricular systolic pressure could not be assessed.  3. The mitral valve is degenerative. Mild thickening of the mitral valve leaflet. There is severe mitral annular calcification present. Mild mitral valve stenosis.  4. Pulmonic valve regurgitation was not assessed by color flow Doppler.  5. - TAVR: S/P TAVR that appears to be functioning normally. There is no perivavluarl AR. The mean AVG is normal at 55mHg. LVOT/AV VTI ratio:0.54. AV Vmax:279.00 cm/s. - 02/11/2019   Walked RA  2 laps @  approx 2551feach @ slow  pace  stopped due to end of study, sob but sats still 100%    Symptoms are markedly disproportionate to objective findings and not clear to what extent this is actually a pulmonary  problem but pt does appear to have difficult to sort out respiratory symptoms of unknown origin for which  DDX  = almost all start with A and  include Adherence, Ace Inhibitors, Acid Reflux, Active Sinus Disease,  Alpha 1 Antitripsin deficiency, Anxiety masquerading as Airways dz,  ABPA,  Allergy(esp in young), Aspiration (esp in elderly), Adverse effects of meds,  Active smoking or Vaping, A bunch of PE's/clot burden (a few small clots can't cause this syndrome unless there is already severe underlying pulm or vascular dz with poor reserve),  Anemia or thyroid disorder, plus two Bs  = Bronchiectasis and Beta blocker use..and one C= CHF     Adherence is always the initial "prime suspect" and is a multilayered concern that requires a "trust but verify" approach in every patient - starting with knowing how to use medications, especially inhalers, correctly, keeping up with refills and understanding the fundamental difference between maintenance and prns vs those medications only taken for a very short course and then stopped and not refilled.  - return with all meds in hand using a trust but verify approach to confirm accurate Medication  Reconciliation The principal here is that until we are certain that the  patients are doing what we've asked, it makes no sense to ask them to do more.   ? Anxiety/depression/ deconditioning > usually at the bottom of this list of usual suspects but should be much higher on this pt's based on H and P and note already on psychotropics and may interfere with adherence and also interpretation of response or lack thereof to symptom management which can be quite  subjective.   ? Anemia/thyroid dz -   Lab Results  Component Value Date   HGB 10.4 (L) 11/17/2018   HGB 11.0 (L) 11/12/2018   HGB 9.0 (L) 10/23/2018   HGB 9.2 (L) 10/22/2018   HGB 10.7 (L) 04/14/2018   HGB 11.5 02/19/2018    Lab Results  Component Value Date   TSH 13.59 (A) 12/01/2018  >>>> under care of endocrinology now   ? A bunch of PE's > very unlikely on eliquis   ? Acid (or non-acid) GERD > always difficult to exclude as up to 75% of pts in some series report no assoc GI/ Heartburn symptoms> rec continue  max (24h)  acid suppression and diet restrictions/ reviewed     ? Adverse drug effects > none listed    >>>> mainly needs reconditioning at this point, f/u q 3 m and check sats at peak ex.     I had an extended discussion with the patient/husband reviewing all relevant studies completed to date (including extensive inpt records and cards notes) and  lasting 25 minutes of a 40  minute office visit to re-establish    re  severe non-specific but potentially very serious refractory respiratory symptoms of uncertain and potentially multiple  Etiologies.  I  directly observed portions of ambulatory 02 saturation study which extended face to face time for this ov    Each maintenance medication was reviewed in detail including most importantly the difference between maintenance and prns and under what circumstances the prns are to be triggered using an action plan format that is not reflected in the computer generated alphabetically organized AVS.    Please see AVS for specific instructions unique to this office visit that I personally wrote and verbalized to the the pt in detail and then reviewed with pt  by my nurse highlighting any changes in therapy/plan of care  recommended at today's visit.

## 2019-02-12 NOTE — Assessment & Plan Note (Signed)
Cyclical cough rx 3/39/1792 >>> repeat 11/03/2015 as did not follow instructions the first time > resolved  - FENO 11/02/2015  =   6 - Allergy profile 11/02/2015 >  Eos 0.1 /  IgE  137 dust only  - Sinus CT 11/09/2015 > Mucosal thickening inferior right maxillary antrum. Visualized paranasal sinuses elsewhere clear. - 12/13/2015 all better off all meds p cyclical cough rx/ gerd rx  - Allergy profile 07/19/16 >  Eos 0.2 /  IgE  192 RAST pos dust only 03/20/17 EGD [name]:  Loose NF, Barrett's   - 03/03/18 nl manometry and Ph impedence studies   - reports resolved as of 02/11/2019  On max gerd rx plus 1st gen H1 blockers per guidelines    Ok to take h1 x 2 hs since having insomnia issues but no further w/u needed

## 2019-02-13 ENCOUNTER — Telehealth: Payer: Self-pay | Admitting: Internal Medicine

## 2019-02-13 NOTE — Telephone Encounter (Signed)
Patient was calling back due to call she received regarding xray results.  LVM for patient to advise of results and to call back if there were any questions.Nothing further needed at this time.

## 2019-02-16 ENCOUNTER — Other Ambulatory Visit: Payer: Self-pay | Admitting: Endocrinology

## 2019-02-16 ENCOUNTER — Telehealth: Payer: Self-pay

## 2019-02-16 DIAGNOSIS — E038 Other specified hypothyroidism: Secondary | ICD-10-CM

## 2019-02-16 MED ORDER — PANTOPRAZOLE SODIUM 40 MG PO TBEC: 40.0000 mg | DELAYED_RELEASE_TABLET | Freq: Every day | ORAL | 3 refills | Status: DC

## 2019-02-16 NOTE — Telephone Encounter (Signed)
Per Dr. Loanne Drilling, unable to refill Synthroid without an appt. Routing this message to the front desk for scheduling purposes.

## 2019-02-16 NOTE — Telephone Encounter (Signed)
Please refer to pt response

## 2019-02-16 NOTE — Telephone Encounter (Signed)
-----   Message from Yetta Flock, MD sent at 02/16/2019  1:14 PM EDT ----- Regarding: RE: PPI qd or bid? If she has been taking 74m once daily, despite what her med list suggests, which is what I think she is doing, then let's refill 470m/ day, #90 RF#3. If she is taking it more frequently let me know. Thanks Jan ----- Message ----- From: HoRoetta SessionsCMA Sent: 02/16/2019  11:19 AM EDT To: StYetta FlockMD Subject: PPI qd or bid?                                 Good morning - just to clarify.....Marland KitchenMarland Kitchen is requesting refill of pantoprazole 4076m Her med list indicates BID but your recent EGD note indicates once daily.  Should I refill at once daily or twice?  OK to send 90 day supply with refills?  Thanks, Jan

## 2019-02-16 NOTE — Telephone Encounter (Signed)
Called patient to schedule appointment. She stated she didn't need an appointment or an refill on her medication

## 2019-02-16 NOTE — Telephone Encounter (Signed)
Called and spoke to pt. She has been taking it once a day.  Refilled qd, #90 with 3 refills.

## 2019-02-16 NOTE — Telephone Encounter (Signed)
1.  Aug, 2020 thyroid blood test was worse 2.  DM f/u is due anyway.  Please let us help you.

## 2019-02-16 NOTE — Telephone Encounter (Signed)
Please review conversation below. Not sure how to proceed from here

## 2019-02-16 NOTE — Telephone Encounter (Signed)
1.  Please schedule f/u appt 2.  Then please refill x 1, pending that appt.  

## 2019-02-16 NOTE — Telephone Encounter (Signed)
Please advise 

## 2019-02-17 ENCOUNTER — Telehealth: Payer: Self-pay | Admitting: Internal Medicine

## 2019-02-17 DIAGNOSIS — R2689 Other abnormalities of gait and mobility: Secondary | ICD-10-CM | POA: Diagnosis not present

## 2019-02-17 DIAGNOSIS — Z7409 Other reduced mobility: Secondary | ICD-10-CM | POA: Diagnosis not present

## 2019-02-17 DIAGNOSIS — I69398 Other sequelae of cerebral infarction: Secondary | ICD-10-CM | POA: Diagnosis not present

## 2019-02-17 NOTE — Telephone Encounter (Signed)
ATC pt, no answer. Left message for pt to call back.  

## 2019-02-18 ENCOUNTER — Other Ambulatory Visit: Payer: Self-pay

## 2019-02-18 ENCOUNTER — Ambulatory Visit
Admission: RE | Admit: 2019-02-18 | Discharge: 2019-02-18 | Disposition: A | Discharge status: Home / Self Care | Payer: Medicare Other | Source: Ambulatory Visit | Attending: Obstetrics | Admitting: Obstetrics

## 2019-02-18 DIAGNOSIS — Z1231 Encounter for screening mammogram for malignant neoplasm of breast: Secondary | ICD-10-CM

## 2019-02-18 NOTE — Telephone Encounter (Signed)
Notes recorded by Tanda Rockers, MD on 02/12/2019 at 5:44 AM EDT  Call pt: Reviewed cxr and no acute change so no change in recommendations made at ov  --------------------------------------------- [facility] x1 for pt.

## 2019-02-19 DIAGNOSIS — Z7409 Other reduced mobility: Secondary | ICD-10-CM | POA: Diagnosis not present

## 2019-02-19 DIAGNOSIS — I69398 Other sequelae of cerebral infarction: Secondary | ICD-10-CM | POA: Diagnosis not present

## 2019-02-19 DIAGNOSIS — R2689 Other abnormalities of gait and mobility: Secondary | ICD-10-CM | POA: Diagnosis not present

## 2019-02-19 NOTE — Telephone Encounter (Signed)
Spoke with pt. She is aware of results. Nothing further was needed.  

## 2019-02-20 ENCOUNTER — Other Ambulatory Visit: Payer: Self-pay | Admitting: Obstetrics

## 2019-02-20 DIAGNOSIS — R928 Other abnormal and inconclusive findings on diagnostic imaging of breast: Secondary | ICD-10-CM

## 2019-02-24 ENCOUNTER — Other Ambulatory Visit: Payer: Self-pay | Admitting: Adult Health

## 2019-02-24 DIAGNOSIS — I69398 Other sequelae of cerebral infarction: Secondary | ICD-10-CM | POA: Diagnosis not present

## 2019-02-24 DIAGNOSIS — Z7409 Other reduced mobility: Secondary | ICD-10-CM | POA: Diagnosis not present

## 2019-02-24 DIAGNOSIS — R2689 Other abnormalities of gait and mobility: Secondary | ICD-10-CM | POA: Diagnosis not present

## 2019-02-24 MED ORDER — AMOXICILLIN 500 MG PO CAPS: 2000.0000 mg | ORAL_CAPSULE | Freq: Once | ORAL | 0 refills | Status: AC

## 2019-02-26 DIAGNOSIS — R2689 Other abnormalities of gait and mobility: Secondary | ICD-10-CM | POA: Diagnosis not present

## 2019-02-26 DIAGNOSIS — I69398 Other sequelae of cerebral infarction: Secondary | ICD-10-CM | POA: Diagnosis not present

## 2019-02-26 DIAGNOSIS — Z7409 Other reduced mobility: Secondary | ICD-10-CM | POA: Diagnosis not present

## 2019-03-03 DIAGNOSIS — Z7409 Other reduced mobility: Secondary | ICD-10-CM | POA: Diagnosis not present

## 2019-03-03 DIAGNOSIS — I69398 Other sequelae of cerebral infarction: Secondary | ICD-10-CM | POA: Diagnosis not present

## 2019-03-03 DIAGNOSIS — R2689 Other abnormalities of gait and mobility: Secondary | ICD-10-CM | POA: Diagnosis not present

## 2019-03-09 ENCOUNTER — Telehealth: Payer: Self-pay

## 2019-03-09 NOTE — Telephone Encounter (Signed)
Left message for patient to please call back

## 2019-03-10 ENCOUNTER — Telehealth: Payer: Self-pay

## 2019-03-10 ENCOUNTER — Other Ambulatory Visit: Payer: Self-pay

## 2019-03-10 DIAGNOSIS — R932 Abnormal findings on diagnostic imaging of liver and biliary tract: Secondary | ICD-10-CM

## 2019-03-10 NOTE — Telephone Encounter (Signed)
-----   Message from Hughie Closs, RN sent at 10/06/2018 10:50 AM EDT ----- Schedule 6 monthe F/U  for [name] and office visit.See Result Note 10/03/18 Dr. Havery Moros. = 03/2019

## 2019-03-10 NOTE — Telephone Encounter (Signed)
1.  Please schedule f/u appt 2.  Then please refill x 1, pending that appt.  

## 2019-03-10 NOTE — Telephone Encounter (Signed)
Received notification from [facility] that pt is requesting refill of Humulin N. LOV 11/20/18. Per Dr. Cordelia Pen note, pt was advised to f/u in 1 month. Pt canceled 12/22/18 appt via automated system and failed to call back in to reschedule appt. No future appt noted. Routing this message to Dr. Loanne Drilling for him to address.

## 2019-03-10 NOTE — Telephone Encounter (Signed)
Per Dr. Loanne Drilling, unable to refill Humulin N without an appt. Routing this message to the front desk for scheduling purposes.

## 2019-03-10 NOTE — Telephone Encounter (Signed)
Scheduled patient for US-abd/Ltd. RUQ (6 month follow-up) for possible cirrhosis. On 03/16/19 @ [facility] patient to arrive at 9:15am and be NPO after midnight. Left message on patient's voice mail of appt. (per patient's OK)

## 2019-03-11 DIAGNOSIS — I4891 Unspecified atrial fibrillation: Secondary | ICD-10-CM | POA: Diagnosis not present

## 2019-03-11 DIAGNOSIS — Z8673 Personal history of transient ischemic attack (TIA), and cerebral infarction without residual deficits: Secondary | ICD-10-CM | POA: Diagnosis not present

## 2019-03-11 DIAGNOSIS — E039 Hypothyroidism, unspecified: Secondary | ICD-10-CM | POA: Diagnosis not present

## 2019-03-11 DIAGNOSIS — I2510 Atherosclerotic heart disease of native coronary artery without angina pectoris: Secondary | ICD-10-CM | POA: Diagnosis not present

## 2019-03-11 DIAGNOSIS — E1149 Type 2 diabetes mellitus with other diabetic neurological complication: Secondary | ICD-10-CM | POA: Diagnosis not present

## 2019-03-11 DIAGNOSIS — Z952 Presence of prosthetic heart valve: Secondary | ICD-10-CM | POA: Diagnosis not present

## 2019-03-11 DIAGNOSIS — Z794 Long term (current) use of insulin: Secondary | ICD-10-CM | POA: Diagnosis not present

## 2019-03-12 ENCOUNTER — Encounter: Payer: Self-pay | Admitting: Adult Health

## 2019-03-12 ENCOUNTER — Ambulatory Visit (INDEPENDENT_AMBULATORY_CARE_PROVIDER_SITE_OTHER): Payer: Medicare Other | Admitting: Adult Health

## 2019-03-12 ENCOUNTER — Other Ambulatory Visit: Payer: Self-pay

## 2019-03-12 VITALS — BP 128/78 | HR 84 | Temp 97.2°F | Ht 65.0 in | Wt 194.0 lb

## 2019-03-12 DIAGNOSIS — I69398 Other sequelae of cerebral infarction: Secondary | ICD-10-CM

## 2019-03-12 DIAGNOSIS — R41841 Cognitive communication deficit: Secondary | ICD-10-CM

## 2019-03-12 DIAGNOSIS — R0609 Other forms of dyspnea: Secondary | ICD-10-CM

## 2019-03-12 DIAGNOSIS — I480 Paroxysmal atrial fibrillation: Secondary | ICD-10-CM | POA: Diagnosis not present

## 2019-03-12 DIAGNOSIS — E0865 Diabetes mellitus due to underlying condition with hyperglycemia: Secondary | ICD-10-CM | POA: Diagnosis not present

## 2019-03-12 DIAGNOSIS — R0600 Dyspnea, unspecified: Secondary | ICD-10-CM | POA: Diagnosis not present

## 2019-03-12 DIAGNOSIS — I63423 Cerebral infarction due to embolism of bilateral anterior cerebral arteries: Secondary | ICD-10-CM

## 2019-03-12 DIAGNOSIS — I10 Essential (primary) hypertension: Secondary | ICD-10-CM

## 2019-03-12 DIAGNOSIS — R2689 Other abnormalities of gait and mobility: Secondary | ICD-10-CM | POA: Diagnosis not present

## 2019-03-12 NOTE — Progress Notes (Signed)
[facility] [address] [place]. [place] [address] [phone]       OFFICE FOLLOW UP NOTE  [name] Date of Birth:  12/11/1945 Medical Record Number:  [id number]   Reason for Referral: stroke follow up    CHIEF COMPLAINT:  Chief Complaint  Patient presents with   Cerebrovascular Accident    3 month FU, husband- [name], "cont to have SOB"    HPI: Stroke admission 10/21/2018: [name] is a 73 y.o. female with history of DVT, aortic stenosis s/p TAVR in March 2020, hypothyroidism, pulmonary hypertension, HTN, CADs/p stenting December 2019 recently admitted for infective endocarditis on the mitral valve treated with 2 weeks gentamicin and ceftriaxone via PEG who presented on 10/21/2018 to the hospital with increased shortness of breath and confusion x2 weeks along with headache.  No focal neurologic symptoms.  New A. Fib with RVR found in the ED and started on heparin drip.  Neurology consulted with work-up revealing multifocal bilateral anterior and posterior infarcts cardioembolic pattern as evidenced on MRI secondary to new onset A. fib versus recent endocarditis.  MRI showed diffuse scattered subcentimeter foci of reduced diffusion and intermediate diffusion throughout the supratentorial brain, brainstem and cerebellum including multiple lesions within the corpus callosum.  Vessel imaging unremarkable with only mild arthrosclerotic bilateral ICA bifurcations.  2D echo normal EF without mention of vegetation.  Recommended initiating D [place] with new onset of A. fib.  HTN stable.  LDL 85 and due to history of statin allergy with myalgias, statin initiation was not recommended.  A1c 7.5 and recommended follow-up with PCP for uncontrolled DM.  Other stroke risk factors include advanced age, obesity, CAD, MVR and status post TAVR 06/2018.  She was discharged home in stable condition without therapy.  She returned to ED on 11/17/2018 with complaints of  sudden onset left-sided headache located left parietal occipital area at the base of her neck.  CTA unremarkable for dissection or other abnormality.  She did receive 2 migraine cocktails with improvement of headache symptoms.  She has since followed up with her PCP who initiated gabapentin along with continuation of nortriptyline 50 mg nightly.  She apparently did not gain benefit from gabapentin therefore recommended discontinuing gabapentin and nortriptyline by PCP and initiated on amitriptyline.  She did have follow-up with ophthalmologist with concerns of occipital neuralgia.   Initial visit 12/10/2018: Residual deficits of mild left hemiparesis, mild left facial droop with occasional speech difficulty, balance difficulties, decreased vision and short-term memory concerns.  She does have difficulty ambulating at times stating her legs feel "rubbery" typically worse worsening in the morning and in the evening.  She is able to ambulate without assistive device.  Prior complaints of left-sided vision with sensation of a shade pulled over her left eye but this is since resolved and recently obtained new prescription glasses by ophthalmology.  She also endorses short-term memory concerns such as forgetting recent question/answer or her husband having to continuously repeat himself.  She denies improvement but her husband states he has seen improvement of her overall ambulation and memory concerns.  She has not received any therapy as this was not recommended at discharge.  She is questioning possible participation in therapy. Previously experiencing left-sided severe headache with recent cessation with continued use of  amitriptyline 25 mg nightly and gabapentin 300 mg nightly.  It was recommended to discontinue gabapentin 300 mg nightly by PCP as she did not gain benefit but she is not aware of  this and has continued.  Symptoms previously located left occipital radiating to frontal region with short duration  sharp stabbing radiating pain which appears to be secondary to possible occipital neuralgia. Previously underwent sleep study for possible OSA which was negative Continues on Eliquis and aspirin 81 mg without bleeding or bruising Continues to follow with PCP for DM management Blood pressure today 107/60 States depression/anxiety well controlled with continuation of Lexapro No further concerns at this time  Update 03/12/2019: Breanna Webster is a 73 year old female who is being seen today for stroke follow-up accompanied by her husband.  She continues to experience balance difficulties, mild left hemiparesis and mild cognitive impairment without much improvement.  Her greatest concern today is ongoing shortness of breath which has been present since the beginning of this year.  Her shortness of breath severely limits her daily functioning and activity.  Recently seen by Dr. Tamala Julian, her cardiologist, who felt continued shortness of breath was multifactorial but highly encouraged increasing activity and possibly cardiac rehab.  She feels as though "something is being missed" and is overly frustrated.  She also continues to experience excessive daytime fatigue and insomnia at night.  Prior sleep study negative for OSA.  She continues on Eliquis and aspirin without bleeding or bruising.  Blood pressure today satisfactory at 128/78.  Continues on home Lexapro 20 mg daily and feels as though depression and anxiety stable.  She also continues on amitriptyline 25 mg nightly for prior concerns with headache.  She denies any reoccurring headaches at this time.  No further concerns at this time.     ROS:   14 system review of systems performed and negative with exception of shortness of breath, fatigue, memory loss, imbalance, anxiety and depression  PMH:  Past Medical History:  Diagnosis Date   Anxiety    Arthritis    "back" (04/22/2018)   Blood transfusion without reported diagnosis    CAD (coronary  artery disease)    a. 03/2018 s/p PCI/DES to the RCA (3.0x15 Onyx DES).   Carotid artery stenosis    Mild   Chronic lower back pain    Colon polyps    Diverticulitis    Diverticulosis    Esophageal thickening    seen on pre TAVR CT scan, also questionable cirrhosis. MRI recommended. Will refer to GI   GERD (gastroesophageal reflux disease)    Grave's disease    History of colonic polyps 05/22/2017   History of hiatal hernia    Hypertension    Hypothyroidism    IBS (irritable bowel syndrome)    Osteopenia    Pulmonary nodules    seen on pre TAVR CT. likley benign. no follow up recommended if pt low risk.   S/P TAVR (transcatheter aortic valve replacement)    Severe aortic stenosis    Stroke (HCC)    Thalassemia minor    Type II diabetes mellitus (HCC)     PSH:  Past Surgical History:  Procedure Laterality Date   85 HOUR [place] STUDY N/A 03/03/2018   Procedure: 24 HOUR PH STUDY;  Surgeon: Mauri [place], MD;  Location: [facility];  Service: Endoscopy;  Laterality: N/A;   COLONOSCOPY     COLONOSCOPY W/ BIOPSIES AND POLYPECTOMY     CORONARY ANGIOGRAPHY Right 04/21/2018   Procedure: CORONARY ANGIOGRAPHY (CATH LAB);  Surgeon: [name], MD;  Location: [facility];  Service: Cardiovascular;  Laterality: Right;   CORONARY STENT INTERVENTION N/A 04/22/2018   Procedure: CORONARY STENT INTERVENTION;  Surgeon: [name], MD;  Location: [facility];  Service: Cardiovascular;  Laterality: N/A;   DILATION AND CURETTAGE OF UTERUS     ESOPHAGEAL MANOMETRY N/A 03/03/2018   Procedure: ESOPHAGEAL MANOMETRY (EM);  Surgeon: Mauri [place], MD;  Location: [facility];  Service: Endoscopy;  Laterality: N/A;   GASTRIC FUNDOPLICATION     HERNIA REPAIR     HYSTEROSCOPY     fibroids   LAPAROSCOPIC CHOLECYSTECTOMY     LAPAROSCOPY     fibroids   NISSEN FUNDOPLICATION  6333L   POLYPECTOMY     RIGHT/LEFT HEART CATH AND CORONARY  ANGIOGRAPHY N/A 02/20/2018   Procedure: RIGHT/LEFT HEART CATH AND CORONARY ANGIOGRAPHY;  Surgeon: [name], MD;  Location: [facility];  Service: Cardiovascular;  Laterality: N/A;   TEE WITHOUT CARDIOVERSION N/A 07/08/2018   Procedure: TRANSESOPHAGEAL ECHOCARDIOGRAM (TEE);  Surgeon: Burnell Blanks, MD;  Location: [facility];  Service: Open Heart Surgery;  Laterality: N/A;   TEE WITHOUT CARDIOVERSION  10/07/2018   TEE WITHOUT CARDIOVERSION N/A 10/07/2018   Procedure: TRANSESOPHAGEAL ECHOCARDIOGRAM (TEE);  Surgeon: Jerline Pain, MD;  [facility];  Service: Cardiovascular;  Laterality: N/A;   TONSILLECTOMY     TRANSCATHETER AORTIC VALVE REPLACEMENT, TRANSFEMORAL N/A 07/08/2018   Procedure: TRANSCATHETER AORTIC VALVE REPLACEMENT, TRANSFEMORAL;  Surgeon: Burnell Blanks, MD;  Location: [facility];  Service: Open Heart Surgery;  Laterality: N/A;    Social History:  Social History   Socioeconomic History   Marital status: Married    Spouse name: Not on file   Number of children: 2   Years of education: Not on file   Highest education level: Not on file  Occupational History   Occupation: Tree surgeon of the [employer] resource strain: Not hard at all   Food insecurity    Worry: Not on file    Inability: Not on file   Transportation needs    Medical: No    Non-medical: No  Tobacco Use   Smoking status: Never Smoker   Smokeless tobacco: Never Used  Substance and Sexual Activity   Alcohol use: No    Alcohol/week: 0.0 standard drinks   Drug use: Never   Sexual activity: Not Currently  Lifestyle   Physical activity    Days per week: Not on file    Minutes per session: Not on file   Stress: To some extent  Relationships   Social connections    Talks on phone: Not on file    Gets together: Not on file    Attends religious service: Not on file    Active member of club or organization: Not on  file    Attends meetings of clubs or organizations: Not on file    Relationship status: Not on file   Intimate partner violence    Fear of current or ex partner: Not on file    Emotionally abused: Not on file    Physically abused: Not on file    Forced sexual activity: Not on file  Other Topics Concern   Not on file  Social History Narrative   Lives with husband in a one story home.     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   Education: college.    Family History:  Family History  Adopted: Yes  Problem Relation Age of Onset   Healthy Son  x 2   Headache Other        Cluster headaches   Heart failure Mother    Colon cancer Neg Hx    Pancreatic cancer Neg Hx    Rectal cancer Neg Hx    Stomach cancer Neg Hx     Medications:   Current Outpatient Medications on File Prior to Visit  Medication Sig Dispense Refill   amitriptyline (ELAVIL) 25 MG tablet TAKE 1 TABLET(25 MG) BY MOUTH AT BEDTIME 90 tablet 1   apixaban (ELIQUIS) 5 MG TABS tablet Take 1 tablet (5 mg total) by mouth 2 (two) times daily. 180 tablet 1   aspirin 81 MG tablet Take 81 mg by mouth daily.       BD INSULIN SYRINGE U/F 31G X 5/16" 1 ML MISC USE TO INJECT INSULIN EVERY DAY 100 each 3   chlorpheniramine (CHLOR-TRIMETON) 4 MG tablet Take 4 mg by mouth at bedtime.      cholecalciferol (VITAMIN D) 1000 units tablet Take 1,000 Units by mouth daily.     diltiazem (CARDIZEM CD) 120 MG 24 hr capsule TAKE 1 CAPSULE(120 MG) BY MOUTH DAILY 90 capsule 1   escitalopram (LEXAPRO) 20 MG tablet TAKE 1 TABLET BY MOUTH EVERY DAY 90 tablet 1   hydrochlorothiazide (HYDRODIURIL) 12.5 MG tablet Take 1 tablet (12.5 mg total) by mouth daily. 90 tablet 1   insulin NPH Human (HUMULIN N) 100 UNIT/ML injection Inject 1 mL (100 Units total) into the skin daily before breakfast. 30 mL 0   Lancets (ONETOUCH DELICA PLUS [id number]) MISC USE TO CHECK  BLOOD SUGAR TWICE DAILY.. 200 each 0   levothyroxine (SYNTHROID) 175 MCG tablet Take 1 tablet (175 mcg total) by mouth daily before breakfast. MUST CALL TO SCHEDULE APPT 30 tablet 0   losartan (COZAAR) 50 MG tablet TAKE 1 TABLET(50 MG) BY MOUTH DAILY 90 tablet 0   Melatonin 5 MG TABS Take by mouth.     metFORMIN (GLUCOPHAGE) 500 MG tablet Take 500 mg by mouth 2 (two) times daily with a meal.      Multiple Vitamin (MULITIVITAMIN WITH MINERALS) TABS Take 1 tablet by mouth daily.     nitroGLYCERIN (NITROSTAT) 0.4 MG SL tablet Place 1 tablet (0.4 mg total) under the tongue every 5 (five) minutes as needed for chest pain. 25 tablet 3   ONETOUCH VERIO test strip USE TO MONITOR GLUCOSE LEVELS TWICE DAILY 200 strip 3   pantoprazole (PROTONIX) 40 MG tablet Take 1 tablet (40 mg total) by mouth daily. 90 tablet 3   No current facility-administered medications on file prior to visit.     Allergies:   Allergies  Allergen Reactions   Statins Other (See Comments)    Muscle aches     Physical Exam  Vitals:   03/12/19 1053  BP: 128/78  Pulse: 84  Temp: (!) 97.2 F (36.2 C)  Weight: 194 lb (88 kg)  Height: 5' 5"  (1.651 m)   Body mass index is 32.28 kg/m. No exam data present   General: well developed, well nourished,  pleasant elderly Caucasian female, seated, in no evident distress Head: head normocephalic and atraumatic.   Neck: supple with no carotid or supraclavicular bruits Cardiovascular: regular rate and rhythm, no murmurs Musculoskeletal: no deformity Skin:  no rash/petichiae Vascular:  Normal pulses all extremities   Neurologic Exam Mental Status: Awake and fully alert. Oriented to place and time. Recent and remote memory intact intact with occasional short-term memory difficulties. Attention span, concentration and fund  of knowledge mostly appropriate with occasional difficulty. Mood and affect appropriate.  Cranial Nerves: Fundoscopic exam reveals sharp disc margins.  Pupils equal, briskly reactive to light. Extraocular movements full without nystagmus. Visual fields full to confrontation. Hearing intact. Facial sensation intact.  Left lower facial weakness Motor: Normal bulk and tone.  Very minimal pronator drift present left arm.  Mild bilateral hip flexor weakness L>R. Sensory.: intact to touch , pinprick , position and vibratory sensation.  Coordination: Rapid alternating movements normal in all extremities. Finger-to-nose showed and heel-to-shin performed accurately bilaterally. Gait and Station: Arises from chair with mild difficulty. Stance is normal. Gait demonstrates  cautious slow steps with moderate imbalance Reflexes: 1+ and symmetric. Toes downgoing.      Diagnostic Data (Labs, Imaging, Testing)  CT HEAD WO CONTRAST 10/21/2018 IMPRESSION: 1. Small bilateral cerebellar infarcts have occurred since study earlier this month. 2. Equivocal for interval right insular infarct, certainty degraded by prominent motion artifact.  CT ANGIO HEAD W OR WO CONTRAST CT ANGIO NECK W OR WO CONTRAST 10/21/2018 IMPRESSION: Negative for intracranial large vessel occlusion. No significant intracranial stenosis  Mild atherosclerotic disease in the carotid bifurcation and cavernous carotid bilaterally without hemodynamically significant stenosis  Both vertebral arteries patent to the basilar. Right vertebral artery dominant.  MR BRAIN WO CONTRAST 10/22/2018 IMPRESSION: Diffuse scattered subcentimeter foci of reduced diffusion and intermediate diffusion throughout the supratentorial brain, brainstem, and cerebellum including multiple lesions within the corpus callosum. Given age and history of atrial fibrillation, findings likely represent multiple embolic acute and subacute infarctions. Demyelination is considered unlikely. No hemorrhage or mass effect.  ECHOCARDIOGRAM 10/21/2018 IMPRESSIONS  1. The left ventricle has normal systolic function,  with an ejection fraction of 60-65%. The cavity size was normal. There is mildly increased left ventricular wall thickness. Left ventricular diastolic Doppler parameters are consistent with impaired  relaxation. Indeterminate filling pressures.  2. There is moderate mitral annular calcification present. Mild mitral valve stenosis.    ASSESSMENT: Breanna Webster is a 73 y.o. year old female presented with increased shortness of breath and confusion x2 weeks along with headache on 10/21/2018 with recent admission on 10/03/2018 for sepsis and mitral valve endocarditis.  Stroke work-up revealed multifocal bilateral anterior and posterior infarcts cardioembolic pattern secondary to new onset A. fib versus recent endocarditis. Vascular risk factors include history of DVT, aortic stenosis status post TAVR 06/2018, pulmonary hypertension, HTN, DM, CAD status post stenting 03/2018, recent endocarditis and new onset A. fib.  Residual deficits of mild left hemiparesis, imbalance and intermittent cognitive difficulties.  Greatest concern at today's visit was ongoing dyspnea on exertion.    PLAN:  1. Multifocal bilateral anterior and posterior infarcts: Continue aspirin 81 mg daily and Eliquis (apixaban) daily  for secondary stroke prevention. Advised that aspirin 60m was not needed from a stroke standpoint but to follow up with cardiology regarding ongoing use from a cardiac standpoint. Maintain strict control of hypertension with blood pressure goal below 130/90, diabetes with hemoglobin A1c goal below 6.5% and cholesterol with LDL cholesterol (bad cholesterol) goal below 70 mg/dL.  I also advised the patient to eat a healthy diet with plenty of whole grains, cereals, fruits and vegetables, exercise regularly with at least 30 minutes of continuous activity daily and maintain ideal body weight. 2. Mild residual deficits: Continue to work with outpatient therapies and potentially restart therapies once improvement of  dyspnea as she is currently having difficulty tolerating. 3. Atrial fibrillation: continue eliquis and follow up with cardiology  4.  HTN: Advised to continue current treatment regimen.  Today's BP satisfactory.  Advised to continue to monitor at home along with continued follow-up with PCP for management 5. DMII: Advised to continue to monitor glucose levels at home along with continued follow-up with PCP for management and monitoring 6. Dyspnea with exertion: Largest concern today with patient overly frustrated for ongoing symptoms.  Continues to follow with cardiology and discussion regarding potential second opinion and she does not believe it is due to deconditioning but believes there is a different underlying cardiac issue due to continued symptoms   Overall stable from stroke standpoint recommend follow-up as needed   Greater than 50% of time during this 25 minute visit was spent on counseling, explanation of diagnosis of embolic infarcts, reviewing risk factor management of A. Fib, HTN, DM, ongoing management of headaches, planning of further management along with potential future management, and discussion with patient and family answering all questions.    [name], AGNP-BC  [facility] [address] [place], [place] [address]  Phone [phone] Fax [phone] Note: This document was prepared with digital dictation and possible smart phrase technology. Any transcriptional errors that result from this process are unintentional.

## 2019-03-12 NOTE — Patient Instructions (Signed)
Continue current treatment regimen for secondary stroke prevention  Continue to work with therapy and potential need of restarting therapy with hopeful improvement of your shortness of breath  Continue to follow with cardiology and possibly obtain second opinion regarding ongoing shortness of breath

## 2019-03-13 ENCOUNTER — Telehealth: Payer: Self-pay | Admitting: Interventional Cardiology

## 2019-03-13 ENCOUNTER — Ambulatory Visit: Admission: RE | Admit: 2019-03-13 | Payer: Medicare Other | Source: Ambulatory Visit

## 2019-03-13 ENCOUNTER — Other Ambulatory Visit: Payer: Self-pay

## 2019-03-13 ENCOUNTER — Ambulatory Visit
Admission: RE | Admit: 2019-03-13 | Discharge: 2019-03-13 | Disposition: A | Discharge status: Home / Self Care | Payer: Medicare Other | Source: Ambulatory Visit | Attending: Obstetrics | Admitting: Obstetrics

## 2019-03-13 DIAGNOSIS — R928 Other abnormal and inconclusive findings on diagnostic imaging of breast: Secondary | ICD-10-CM | POA: Diagnosis not present

## 2019-03-13 NOTE — Progress Notes (Signed)
I agree with the above plan 

## 2019-03-13 NOTE — Telephone Encounter (Signed)
Signed poc letter has been refaxed to [phone].

## 2019-03-13 NOTE — Telephone Encounter (Signed)
Holly from [facility] states that she is only receiving 1 of the 2 pages that are getting faxed to her office. She really needs that second page because it has Dr. Thompson Caul signature on it..states it is for her medicaid card. She has a new fax number you can try and send it to: [phone]

## 2019-03-16 ENCOUNTER — Telehealth: Payer: Self-pay | Admitting: Interventional Cardiology

## 2019-03-16 ENCOUNTER — Other Ambulatory Visit: Payer: Self-pay

## 2019-03-16 ENCOUNTER — Ambulatory Visit (HOSPITAL_COMMUNITY)
Admission: RE | Admit: 2019-03-16 | Discharge: 2019-03-16 | Disposition: A | Discharge status: Home / Self Care | Payer: Medicare Other | Source: Ambulatory Visit | Attending: Gastroenterology | Admitting: Gastroenterology

## 2019-03-16 ENCOUNTER — Telehealth: Payer: Self-pay

## 2019-03-16 DIAGNOSIS — R932 Abnormal findings on diagnostic imaging of liver and biliary tract: Secondary | ICD-10-CM | POA: Insufficient documentation

## 2019-03-16 DIAGNOSIS — K7689 Other specified diseases of liver: Secondary | ICD-10-CM | POA: Diagnosis not present

## 2019-03-16 NOTE — Telephone Encounter (Signed)
Pt c/o of Chest Pain: STAT if CP now or developed within 24 hours  1. Are you having CP right now? No  2. Are you experiencing any other symptoms (ex. SOB, nausea, vomiting, sweating)? No  3. How long have you been experiencing CP? 30 minutes this morning   4. Is your CP continuous or coming and going? Only happened once   5. Have you taken Nitroglycerin? No   Patient is concerned because she have never experienced CP before today. ?

## 2019-03-16 NOTE — Telephone Encounter (Signed)
Observation for now. NTG sl if recurs. Keep [name] posted.

## 2019-03-16 NOTE — Telephone Encounter (Signed)
I spoke with pt. She reports while waiting to go in for liver ultrasound she had terrible squeezing pain across her chest.  Lasted about 30 minutes and then went away. She was able to proceed with ultrasound as scheduled.  Currently she has some tingling in left forearm to wrist. No pain in arm  No chest pain. Has chronic shortness of breath which she feels is worsening.  Seeing pulmonary for this.  During episode earlier today she felt like she was having "internal chills" She has never had chest pain before.  I told pt I would send message to Dr Tamala Julian for review/recommendations.   I advised her to go to ED if chest pain returns

## 2019-03-16 NOTE — Telephone Encounter (Signed)
Last entry on 02/16/19:  [facility] sent this to [name] upon mistake the pt is not interested in being seen here and did not request the refill. No further need to address   LOV 11/20/18. Per Dr. Cordelia Pen office note, pt was advised to f/u 1 month. Uncertain how he wishes to proceed with this refill request in light of above entry. Routing to Dr. Loanne Drilling.

## 2019-03-16 NOTE — Telephone Encounter (Signed)
Per Patient: Patient is being treated by a different Physician and will no longer be receiving treatment from Dr. Loanne Drilling

## 2019-03-17 NOTE — Telephone Encounter (Signed)
Spoke with pt and advised her of recommendations.  Pt verbalized understanding.

## 2019-03-18 ENCOUNTER — Telehealth: Payer: Self-pay

## 2019-03-18 NOTE — Telephone Encounter (Signed)
Left message for patient to please call back

## 2019-03-25 ENCOUNTER — Encounter: Payer: Self-pay | Admitting: Adult Health

## 2019-03-26 DIAGNOSIS — L821 Other seborrheic keratosis: Secondary | ICD-10-CM | POA: Diagnosis not present

## 2019-03-26 DIAGNOSIS — Z23 Encounter for immunization: Secondary | ICD-10-CM | POA: Diagnosis not present

## 2019-03-26 DIAGNOSIS — D220 Melanocytic nevi of lip: Secondary | ICD-10-CM | POA: Diagnosis not present

## 2019-03-26 DIAGNOSIS — R202 Paresthesia of skin: Secondary | ICD-10-CM | POA: Diagnosis not present

## 2019-03-26 NOTE — Progress Notes (Signed)
CARDIOLOGY OFFICE NOTE  Date:  04/01/2019    Breanna Webster Date of Birth: 1945/06/12 Medical Record #[id number]  PCP:  [name], NP  Cardiologist:  Jennings Books    Chief Complaint  Patient presents with  . Follow-up    Seen for Dr. Tamala Julian    History of Present Illness: Breanna Webster is a 73 y.o. female who presents today for a follow up visit. Seen for Dr. Tamala Julian.   She has a history of DM, Graves disease,aortic stenosis status post TAVR in 3/20, hypothyroidism, hypertension, pulmonary hypertensionandcoronary disease withPCI/DES x1 to the mRCA back in 12/19. She had mitral valve endocarditis 09/2018, PAF, and cerebellar infarction in 10/2018.   I last saw her for a post hospital visit back in July. She saw Dr. Tamala Julian in August - still with some memory and communication issues. Had finished her course of antibiotics for SBE. Unfortunately, her dyspnea had not improved. She was fighting with her husband about physical activity - this has been an ongoing issue. She was not sleeping - was gaining weight.   Her cardiac problems were discussed at length at her last visit and include: Significant diastolic heart failure which is contributing to dyspnea; history of aortic stenosis treated with TAVR successfully; asymptomatic coronary artery disease treated with stenting prior to TAVR; atrial fibrillation/flutter secondary to the underlying structural and vascular heart disease noted.  Stroke secondary to either atrial fibrillation/flutter or embolization from mitral valve vegetation.  The patient and her husband do not have symptoms concerning for COVID-19 infection (fever, chills, cough, or new shortness of breath).   Comes in today. Here with her husband. They are arguing again. He notes her HR is in the 90's and he is worried about this and the fact that she really can't walk any distance. She has been using a new pulse ox. They continue to argue during the visit. She  says her HR was in the 84's the last few days. Now doing PT twice a week. She still "can't breath". This is not new. She says no one "knows why". She can't go more than 100 feet. She says she wants to be able to do more and can't. She wants to know why. No passing out. No dizzy spells. Balance is better. Speech is improved. More clearer mentation. She is pretty adamant that she wants to know what is wrong and what we can do to make her feel better. She wants to know if her thalassemia is playing a role - she has not had follow up for this since leaving [place].    Past Medical History:  Diagnosis Date  . Anxiety   . Arthritis    "back" (04/22/2018)  . Blood transfusion without reported diagnosis   . CAD (coronary artery disease)    a. 03/2018 s/p PCI/DES to the RCA (3.0x15 Onyx DES).  . Carotid artery stenosis    Mild  . Chronic lower back pain   . Colon polyps   . Diverticulitis   . Diverticulosis   . Esophageal thickening    seen on pre TAVR CT scan, also questionable cirrhosis. MRI recommended. Will refer to GI  . GERD (gastroesophageal reflux disease)   . Grave's disease   . History of colonic polyps 05/22/2017  . History of hiatal hernia   . Hypertension   . Hypothyroidism   . IBS (irritable bowel syndrome)   . Osteopenia   . Pulmonary nodules    seen on pre  TAVR CT. likley benign. no follow up recommended if pt low risk.  . S/P TAVR (transcatheter aortic valve replacement)   . Severe aortic stenosis   . Stroke ([place])   . Thalassemia minor   . Type II diabetes mellitus ([place])     Past Surgical History:  Procedure Laterality Date  . [place] STUDY N/A 03/03/2018   Procedure: [place] STUDY;  Surgeon: Mauri Pole, MD;  Location: [facility];  Service: Endoscopy;  Laterality: N/A;  . COLONOSCOPY    . COLONOSCOPY W/ BIOPSIES AND POLYPECTOMY    . CORONARY ANGIOGRAPHY Right 04/21/2018   Procedure: CORONARY ANGIOGRAPHY (CATH LAB);  Surgeon: [name], MD;  Location:  [place] [facility];  Service: Cardiovascular;  Laterality: Right;  . CORONARY STENT INTERVENTION N/A 04/22/2018   Procedure: CORONARY STENT INTERVENTION;  Surgeon: [name], MD;  Location: [place] [facility];  Service: Cardiovascular;  Laterality: N/A;  . DILATION AND CURETTAGE OF UTERUS    . ESOPHAGEAL MANOMETRY N/A 03/03/2018   Procedure: ESOPHAGEAL MANOMETRY (EM);  Surgeon: Mauri Pole, MD;  Location: [facility];  Service: Endoscopy;  Laterality: N/A;  . GASTRIC FUNDOPLICATION    . HERNIA REPAIR    . HYSTEROSCOPY     fibroids  . LAPAROSCOPIC CHOLECYSTECTOMY    . LAPAROSCOPY     fibroids  . NISSEN FUNDOPLICATION  6503T  . POLYPECTOMY    . RIGHT/LEFT HEART CATH AND CORONARY ANGIOGRAPHY N/A 02/20/2018   Procedure: RIGHT/LEFT HEART CATH AND CORONARY ANGIOGRAPHY;  Surgeon: [name], MD;  Location: [place] [facility];  Service: Cardiovascular;  Laterality: N/A;  . TEE WITHOUT CARDIOVERSION N/A 07/08/2018   Procedure: TRANSESOPHAGEAL ECHOCARDIOGRAM (TEE);  Surgeon: Burnell Blanks, MD;  Location: [place];  Service: Open Heart Surgery;  Laterality: N/A;  . TEE WITHOUT CARDIOVERSION  10/07/2018  . TEE WITHOUT CARDIOVERSION N/A 10/07/2018   Procedure: TRANSESOPHAGEAL ECHOCARDIOGRAM (TEE);  Surgeon: Jerline Pain, MD;  Location: [facility];  Service: Cardiovascular;  Laterality: N/A;  . TONSILLECTOMY    . TRANSCATHETER AORTIC VALVE REPLACEMENT, TRANSFEMORAL N/A 07/08/2018   Procedure: TRANSCATHETER AORTIC VALVE REPLACEMENT, TRANSFEMORAL;  Surgeon: Burnell Blanks, MD;  Location: [facility];  Service: Open Heart Surgery;  Laterality: N/A;     Medications: Current Meds  Medication Sig  . amitriptyline (ELAVIL) 25 MG tablet TAKE 1 TABLET(25 MG) BY MOUTH AT BEDTIME  . apixaban (ELIQUIS) 5 MG TABS tablet Take 1 tablet (5 mg total) by mouth 2 (two) times daily.  Marland Kitchen aspirin 81 MG tablet Take 81 mg by mouth daily.    . BD INSULIN SYRINGE U/F 31G X 5/16" 1 ML MISC USE  TO INJECT INSULIN EVERY DAY  . cholecalciferol (VITAMIN D) 1000 units tablet Take 1,000 Units by mouth daily.  Marland Kitchen diltiazem (CARDIZEM CD) 120 MG 24 hr capsule TAKE 1 CAPSULE(120 MG) BY MOUTH DAILY  . escitalopram (LEXAPRO) 20 MG tablet TAKE 1 TABLET BY MOUTH EVERY DAY  . hydrochlorothiazide (HYDRODIURIL) 12.5 MG tablet Take 1 tablet (12.5 mg total) by mouth daily.  . insulin NPH Human (HUMULIN N) 100 UNIT/ML injection Inject 1 mL (100 Units total) into the skin daily before breakfast.  . Lancets (ONETOUCH DELICA PLUS [id number]) MISC USE TO CHECK BLOOD SUGAR TWICE DAILY..  . levothyroxine (SYNTHROID) 175 MCG tablet Take 1 tablet (175 mcg total) by mouth daily before breakfast. MUST CALL TO SCHEDULE APPT  . losartan (COZAAR) 50 MG tablet TAKE 1 TABLET(50 MG) BY  MOUTH DAILY  . Melatonin 5 MG TABS Take 5 mg by mouth at bedtime as needed.   . metFORMIN (GLUCOPHAGE) 500 MG tablet Take 500 mg by mouth 2 (two) times daily with a meal.   . Multiple Vitamin (MULITIVITAMIN WITH MINERALS) TABS Take 1 tablet by mouth daily.  . nitroGLYCERIN (NITROSTAT) 0.4 MG SL tablet Place 1 tablet (0.4 mg total) under the tongue every 5 (five) minutes as needed for chest pain.  [name] VERIO test strip USE TO MONITOR GLUCOSE LEVELS TWICE DAILY  . pantoprazole (PROTONIX) 40 MG tablet Take 1 tablet (40 mg total) by mouth daily.     Allergies: Allergies  Allergen Reactions  . Statins Other (See Comments)    Muscle aches    Social History: The patient  reports that she has never smoked. She has never used smokeless tobacco. She reports that she does not drink alcohol or use drugs.   Family History: The patient's family history includes Headache in an other family member; Healthy in her son; Heart failure in her mother. She was adopted.   Review of Systems: Please see the history of present illness.   All other systems are reviewed and negative.   Physical Exam: VS:  BP 120/60   Pulse 84   Ht 5' 5"  (1.651 m)    Wt 195 lb 12.8 oz (88.8 kg)   BMI 32.58 kg/m  .  BMI Body mass index is 32.58 kg/m.  Wt Readings from Last 3 Encounters:  04/01/19 195 lb 12.8 oz (88.8 kg)  03/12/19 194 lb (88 kg)  02/11/19 192 lb (87.1 kg)   Her oxygen level is 96 with walking - HR goes up to 103.   General: Alert. She is in no acute distress.   HEENT: Normal.  Neck: Supple, no JVD, carotid bruits, or masses noted.  Cardiac: Regular rate and rhythm. Harsh outflow murmur noted. Trace edema.  Respiratory:  Lungs are clear to auscultation bilaterally with normal work of breathing.  GI: Soft and nontender.  MS: No deformity or atrophy. Gait and ROM intact.  Skin: Warm and dry. Color is normal.  Neuro:  Strength and sensation are intact and no gross focal deficits noted.  Psych: Alert, appropriate and with normal affect.   LABORATORY DATA:  EKG:  EKG is ordered today. This shows NSR with LBBB  Lab Results  Component Value Date   WBC 5.1 11/17/2018   HGB 10.4 (L) 11/17/2018   HCT 34.4 (L) 11/17/2018   PLT 178 11/17/2018   GLUCOSE 249 (H) 11/17/2018   CHOL 128 10/22/2018   TRIG 95 10/22/2018   HDL 24 (L) 10/22/2018   LDLDIRECT 98.0 05/24/2014   LDLCALC 85 10/22/2018   ALT 69 (A) 12/01/2018   AST 60 (A) 12/01/2018   NA 139 12/01/2018   K 4.2 12/01/2018   CL 99 11/17/2018   CREATININE 1.0 12/01/2018   BUN 18 12/01/2018   CO2 24 11/17/2018   TSH 13.59 (A) 12/01/2018   INR 1.2 10/21/2018   HGBA1C 7.5 (H) 10/22/2018   MICROALBUR 0.8 10/07/2015     BNP (last 3 results) Recent Labs    07/07/18 1630  BNP 112.2*    ProBNP (last 3 results) No results for input(s): PROBNP in the last 8760 hours.   Other Studies Reviewed Today:  [name] ABD US IMPRESSION 02/2019: Focal post cholecystectomy.  Heterogeneous, echogenic and slightly nodular liver most likely representing cirrhosis.   Electronically Signed   By: Crist Infante.D.  On: 03/16/2019 13:52     ECHO IMPRESSIONS 11/2018 (after  completion of antibiotics)   1. The left ventricle has normal systolic function, with an ejection fraction of 60-65%. The cavity size was normal. There is moderately increased left ventricular wall thickness. Left ventricular diastolic Doppler parameters are consistent with  impaired relaxation. Elevated left ventricular end-diastolic pressure.  2. The right ventricle has normal systolc function. The cavity was normal. There is no increase in right ventricular wall thickness. Right ventricular systolic pressure could not be assessed.  3. The mitral valve is degenerative. Mild thickening of the mitral valve leaflet. There is severe mitral annular calcification present. Mild mitral valve stenosis.  4. Pulmonic valve regurgitation was not assessed by color flow Doppler.  5. - TAVR: S/P TAVR that appears to be functioning normally. There is no perivavluarl AR. The mean AVG is normal at 74mHg. LVOT/AV VTI ratio:0.54. AV Vmax:279.00 cm/s.  6. Cannot rule out endocarditis by this study. While no obvious vegetation is seen, there is significant calcification of the MV and the TAVR is not adequately seen to comment on possible small vegetation. Recommend TEE if clinically indicated.   Cardiac TelemetryStudy Highlights 11/2018   NSR  Non sustained WCT, 14 beats, likely SVT  Occasional PVC's  No atrial fibrillation      CORONARY STENT INTERVENTION 03/2018  Conclusion    A stent was successfully placed.    Successful stent mid RCA reducing 85% stenosis to less than 10% using a 3.0 x 15 Onyx postdilated to 3.25 mm diameter.  Stenosis was reduced to less than 10%.  Mynx closure with success.  RECOMMENDATIONS:   Discharge in a.m.  Continue all other medications as previous.  Aspirin and Plavix, uninterrupted for 6 months.     CORONARY ANGIOGRAPHY (CATH LAB) 03/2018  Conclusion   In the interval since October 31, the left coronary remains widely patent without interval  development of significant obstruction.  Right coronary artery could not be engaged due to significant spasm and catheter mismatch with brachial artery.  RECOMMENDATIONS:   PCI 04/22/2018 from right femoral approach.  Discharge today as outpatient.  Return at 10 AM 04/22/2018.  N.p.o. after midnight.     Assessment & Plan   1. PAF - CHADSVASC of at least 6 - on anticoagulation - event monitor from August showed no AF - remains on anticoagulation - remains in NSR by EKG.   2. Chronic LBBB - unchanged.   3. Chronic anticoagulation - samples of Eliquis given. No overt problem noted.   4. CAD - prior PCI - no active chest pain.   5. Prior bilateral cerebellar strokes - multiple other infarcts noted on prior studies - she seems to have made progress since I last saw her.   6. HLD - allergy to statin and myalgias - would favor referral to lipid clinic - this was not discussed with them today.   7. Bacteremia/endocarditis from Strep [place] - has completed her antibiotics - follow up echo was stable.    8. DM - per PCP  9. Chronic DOE/shortness of breath - most likely multifactorial - she is adamant that something is wrong - I have talked with Dr. STamala Julian- he feels like we have done everything possible to no avail for her and that she needs to continue with her current plan. We did offer referral to [facility] to Dr. HAline Brochurefor a 2nd opinion.   10. Prior TAVR 06/2018 - last echo showed this was functioning normally.  20. Hypothyroid - she sees endocrine for this and says this has been adjusted.   12. Thalassemia minor - unclear if this is influencing her symptoms. She would like referral to Hematology.   12. COVID-19 Education: The signs and symptoms of COVID-19 were discussed with the patient and how to seek care for testing (follow up with PCP or arrange E-visit).  The importance of social distancing, staying at home, hand hygiene and wearing a mask when out in public were  discussed today.  Current medicines are reviewed with the patient today.  The patient does not have concerns regarding medicines other than what has been noted above.  The following changes have been made:  See above.  Labs/ tests ordered today include:    Orders Placed This Encounter  Procedures  . Ambulatory referral to Hematology  . Ambulatory referral to Cardiology  . EKG 12-Lead     Disposition:   FU with [name] in a few months. I have placed referral to [facility] for a second opinion.  We feel we have limited options and have exhausted our options.    Patient is agreeable to this plan and will call if any problems develop in the interim.   SignedTruitt Merle, NP  04/01/2019 4:04 PM  [facility] [address] [place], [place]  [address] Phone: [phone] Fax: [phone]

## 2019-04-01 ENCOUNTER — Telehealth: Payer: Self-pay | Admitting: *Deleted

## 2019-04-01 ENCOUNTER — Encounter: Payer: Self-pay | Admitting: Nurse Practitioner

## 2019-04-01 ENCOUNTER — Other Ambulatory Visit: Payer: Self-pay

## 2019-04-01 ENCOUNTER — Ambulatory Visit (INDEPENDENT_AMBULATORY_CARE_PROVIDER_SITE_OTHER): Payer: Medicare Other | Admitting: Nurse Practitioner

## 2019-04-01 VITALS — BP 120/60 | HR 84 | Ht 65.0 in | Wt 195.8 lb

## 2019-04-01 DIAGNOSIS — I2510 Atherosclerotic heart disease of native coronary artery without angina pectoris: Secondary | ICD-10-CM

## 2019-04-01 DIAGNOSIS — Z952 Presence of prosthetic heart valve: Secondary | ICD-10-CM

## 2019-04-01 DIAGNOSIS — I480 Paroxysmal atrial fibrillation: Secondary | ICD-10-CM | POA: Diagnosis not present

## 2019-04-01 DIAGNOSIS — R0600 Dyspnea, unspecified: Secondary | ICD-10-CM

## 2019-04-01 DIAGNOSIS — I639 Cerebral infarction, unspecified: Secondary | ICD-10-CM | POA: Diagnosis not present

## 2019-04-01 DIAGNOSIS — I10 Essential (primary) hypertension: Secondary | ICD-10-CM | POA: Diagnosis not present

## 2019-04-01 DIAGNOSIS — I330 Acute and subacute infective endocarditis: Secondary | ICD-10-CM | POA: Diagnosis not present

## 2019-04-01 DIAGNOSIS — I447 Left bundle-branch block, unspecified: Secondary | ICD-10-CM

## 2019-04-01 DIAGNOSIS — D563 Thalassemia minor: Secondary | ICD-10-CM | POA: Diagnosis not present

## 2019-04-01 DIAGNOSIS — R0609 Other forms of dyspnea: Secondary | ICD-10-CM

## 2019-04-01 NOTE — Telephone Encounter (Signed)
Called To Dr. Ruthe Mannan office at [facility] for a second opinion @ [phone] and s/w Lovey Newcomer whom gave direct line to [place], Dr. Ruthe Mannan assistance @ [phone].  [facility] is not in office due to [place] but do check messages. Could not leave a message due to not beimg able to leave any medical information on vm.  Will send to schedulers to get pt in for second opinion for TVAR/SOB.

## 2019-04-01 NOTE — Patient Instructions (Addendum)
After Visit Summary:  We will be checking the following labs today - NONE   Medication Instructions:    Continue with your current medicines.    If you need a refill on your cardiac medications before your next appointment, please call your pharmacy.     Testing/Procedures To Be Arranged:  N/A  Follow-Up:   See Dr. Tamala Julian in 4 to 6 months -  You will receive a reminder letter in the mail two months in advance. If you don't receive a letter, please call our office to schedule the follow-up appointment.  I am putting in a referral for hematology to see you regarding your thalassemia trait  I am putting a referral in to see Dr. Lesle Reek and his TAVR team (includes Dr. Ysidro Evert) at [facility] for a second opinion.     At [facility], you and your health needs are our priority.  As part of our continuing mission to provide you with exceptional heart care, we have created designated Provider Care Teams.  These Care Teams include your primary Cardiologist (physician) and Advanced Practice Providers (APPs -  Physician Assistants and Nurse Practitioners) who all work together to provide you with the care you need, when you need it.  Special Instructions:  . Stay safe, stay home, wash your hands for at least 20 seconds and wear a mask when out in public.  . It was good to talk with you today.    Call the [place] office at [phone] if you have any questions, problems or concerns.

## 2019-04-06 DIAGNOSIS — I69398 Other sequelae of cerebral infarction: Secondary | ICD-10-CM | POA: Diagnosis not present

## 2019-04-06 DIAGNOSIS — R2689 Other abnormalities of gait and mobility: Secondary | ICD-10-CM | POA: Diagnosis not present

## 2019-04-06 DIAGNOSIS — Z7409 Other reduced mobility: Secondary | ICD-10-CM | POA: Diagnosis not present

## 2019-04-07 ENCOUNTER — Telehealth: Payer: Self-pay | Admitting: Hematology

## 2019-04-07 NOTE — Telephone Encounter (Signed)
Ms. Staples returned my call to schedule a hem appt w/Dr. Irene Limbo on 04/30/19 at 10am. Pt aware to arrive 15 minutes early.

## 2019-04-09 ENCOUNTER — Telehealth: Payer: Self-pay | Admitting: Interventional Cardiology

## 2019-04-09 NOTE — Telephone Encounter (Signed)
I have called Breanna Webster with Dr. Aline Brochure this afternoon and explained our referral for 2nd opinion for Breanna Webster.   She is now requesting images from the named studies be sent to Dr. Aline Brochure for his review.   [name]

## 2019-04-09 NOTE — Telephone Encounter (Signed)
New Message:  Varney Biles is the TAVR Referral scheduler at [facility] Dr. Lesle Reek.   She was calling in regards to why this patient was referred to see Dr. Aline Brochure. He needs to get a better understanding as to what specifically the patient is needing to be seen for. There is some confusion in regards to the referral.  Before Dr. Aline Brochure can see the patient he is requesting images from the CTA TAVR from 06-17-18, all Echo images from Aug 2019-Present, any Cath images from Aug 2019-Present. All of these images will help Dr. Aline Brochure understand the patient's history better.   The number provided is Keisha's direct line. If she does not answer the phone it is likely because she will be on the line with another patient. If our office would leave a voicemail and a direct number to call her back she would appreciate it.

## 2019-04-09 NOTE — Telephone Encounter (Signed)
Message sent to medical records

## 2019-04-09 NOTE — Telephone Encounter (Signed)
Per Lorriane Shire in echo, all echo imaging requested has been power shared to [facility].  Lorriane Shire with medical records has reached out to [facility] to assist with other requested images.

## 2019-04-13 ENCOUNTER — Telehealth: Payer: Self-pay | Admitting: Interventional Cardiology

## 2019-04-13 NOTE — Telephone Encounter (Signed)
Patient is calling in regards to still not hearing anything about scheduling her appointment at [facility]. Please Advise.

## 2019-04-13 NOTE — Telephone Encounter (Signed)
Spoke with pt and made her aware that [facility] had reached out about getting imaging on discs (echos, TAVR, CTs).  Advised echos have been sent but other requests had to be sent to [place] and we are just waiting to hear from them.  Pt states she will call back next week if she hasn't heard anything.

## 2019-04-13 NOTE — Telephone Encounter (Signed)
She did not say that but I would suspect so.   Breanna Webster

## 2019-04-13 NOTE — Telephone Encounter (Signed)
Breanna Webster, I know you had said you had spoken with someone at [facility].  Did they say they wouldn't schedule until they received all the imaging from [name]?

## 2019-04-17 ENCOUNTER — Other Ambulatory Visit: Payer: Self-pay | Admitting: Adult Health

## 2019-04-24 DIAGNOSIS — I639 Cerebral infarction, unspecified: Secondary | ICD-10-CM

## 2019-04-24 HISTORY — DX: Cerebral infarction, unspecified: I63.9

## 2019-04-27 NOTE — Telephone Encounter (Signed)
  Per medical records, everything was sent over from [facility] on 12/29.

## 2019-04-28 ENCOUNTER — Encounter: Payer: Self-pay | Admitting: Adult Health

## 2019-04-29 ENCOUNTER — Other Ambulatory Visit (INDEPENDENT_AMBULATORY_CARE_PROVIDER_SITE_OTHER): Payer: Medicare Other

## 2019-04-29 ENCOUNTER — Encounter: Payer: Self-pay | Admitting: Gastroenterology

## 2019-04-29 ENCOUNTER — Ambulatory Visit (INDEPENDENT_AMBULATORY_CARE_PROVIDER_SITE_OTHER): Payer: Medicare Other | Admitting: Gastroenterology

## 2019-04-29 VITALS — BP 118/62 | HR 83 | Temp 97.6°F | Ht 65.0 in | Wt 192.6 lb

## 2019-04-29 DIAGNOSIS — K2270 Barrett's esophagus without dysplasia: Secondary | ICD-10-CM

## 2019-04-29 DIAGNOSIS — Z860100 Personal history of colon polyps, unspecified: Secondary | ICD-10-CM

## 2019-04-29 DIAGNOSIS — K760 Fatty (change of) liver, not elsewhere classified: Secondary | ICD-10-CM

## 2019-04-29 DIAGNOSIS — K7460 Unspecified cirrhosis of liver: Secondary | ICD-10-CM

## 2019-04-29 LAB — HEPATIC FUNCTION PANEL
ALT: 39 U/L — ABNORMAL HIGH (ref 0–35)
AST: 36 U/L (ref 0–37)
Albumin: 4.1 g/dL (ref 3.5–5.2)
Alkaline Phosphatase: 81 U/L (ref 39–117)
Bilirubin, Direct: 0.1 mg/dL (ref 0.0–0.3)
Total Bilirubin: 0.5 mg/dL (ref 0.2–1.2)
Total Protein: 7.4 g/dL (ref 6.0–8.3)

## 2019-04-29 LAB — CBC WITH DIFFERENTIAL/PLATELET
Basophils Relative: 0 % (ref 0.0–3.0)
Eosinophils Relative: 2 % (ref 0.0–5.0)
HCT: 29.8 % — ABNORMAL LOW (ref 36.0–46.0)
Hemoglobin: 9 g/dL — ABNORMAL LOW (ref 12.0–15.0)
Lymphocytes Relative: 23 % (ref 12.0–46.0)
MCHC: 30.3 g/dL (ref 30.0–36.0)
MCV: 54.8 fl — ABNORMAL LOW (ref 78.0–100.0)
Monocytes Relative: 2 % — ABNORMAL LOW (ref 3.0–12.0)
Neutrophils Relative %: 73 % (ref 43.0–77.0)
Platelets: 189 10*3/uL (ref 150.0–400.0)
RBC: 5.44 Mil/uL — ABNORMAL HIGH (ref 3.87–5.11)
RDW: 19.8 % — ABNORMAL HIGH (ref 11.5–15.5)
WBC: 7.2 10*3/uL (ref 4.0–10.5)

## 2019-04-29 LAB — PROTIME-INR
INR: 1.6 ratio — ABNORMAL HIGH (ref 0.8–1.0)
Prothrombin Time: 18.4 s — ABNORMAL HIGH (ref 9.6–13.1)

## 2019-04-29 NOTE — Patient Instructions (Addendum)
Normal BMI (Body Mass Index- based on height and weight) is between 23 and 30. Your BMI today is Body mass index is 32.05 kg/m. Marland Kitchen Please consider follow up  regarding your BMI with your Primary Care Provider.  Please go to the lab in the basement of our building to have lab work done as you leave today. Hit "B" for basement when you get on the elevator.  When the doors open the lab is on your left.  We will call you with the results. Thank you.  Due to recent changes in healthcare laws, you may see the results of your imaging and laboratory studies on MyChart before your provider has had a chance to review them.  We understand that in some cases there may be results that are confusing or concerning to you. Not all laboratory results come back in the same time frame and the provider may be waiting for multiple results in order to interpret others.  Please give [name] 48 hours in order for your provider to thoroughly review all the results before contacting the office for clarification of your results.   We will refer you to [facility].  They will call you to schedule an appointment. If you have not heard from them in 2 weeks please let us know.  Thank you for entrusting me with your care and for choosing [facility], Dr. [place] Cellar

## 2019-04-29 NOTE — Progress Notes (Signed)
HPI :  74 y/o female here for a follow up visit. She history of short segment Barrett's esophagus, numerous colon polyps, and newly diagnosed cirrhosis from fatty liver. She also has had a CVA this past year, underwent TAVR, and has chronic dyspnea of unclear etiology.   She has had elevated liver enzymes in the past, she underwent a serologic evaluation in 2017 which was negative.  Thought to be due to fatty liver at the time.  She had a CT scan in February 2020 which raised the question of cirrhotic changes of the liver.  She underwent a follow-up ultrasound in November which showed suggestive changes of cirrhosis of the liver.  Her liver enzymes most recently were checked in August, AST and ALT were in the 60s.  She states after her stroke in April she did lose 13 pounds, but has since regained about 3 pounds.  Her body mass index is 32.  She does not drink any alcohol.  She denies any family history of cirrhosis.  She has had no issues with ascites, jaundice, encephalopathy, nor varices.  She was immunized to hepatitis A and B in 2017.  Her last endoscopy was done in October 2020.  She had a very short segment of nondysplastic Barrett's esophagus.  No esophageal or gastric varices were noted.  She takes Protonix 40 mg once a day and this works quite well to control her reflux at baseline.  She denies much of any breakthrough at all and is happy with the regimen for the most part.  She otherwise had 28 adenomas removed on her last colonoscopy in 2018. She denies any problems with her bowel habits, no blood in the stools. Genetic testing was done and negative.  She had a follow-up colonoscopy with me in October 2020, she had an additional 8 small adenomas removed, it was recommended she have a repeat colonoscopy in 3 years.  She otherwise has ongoing dyspnea that has bothered her.  She is having a second opinion with cardiology at [facility].  She is significantly limited with her ambulation due to dyspnea  but has not required oxygen.  She is also seeing a hematologist for thalassemia minor.    Endoscopic history: EGD 02/03/19 - irregular zline, biopsies c/w nondysplastic BE, loose nissen, no varices, benign duodenal polyp Colonoscopy 02/03/19 - 8 small adenomas, diverticulosis, lipoma EGD 03/20/2017 - irregular z line, LA grade B esophagitis, small paraesophageal hernia, loose Nissen wrap, mild gastritis, duodenal erosions - path c/w Barrett's, H pylori negative  Colonoscopy 03/20/2017 - pancolonic diverticulosis, 28 polyps removed - EGD 2002 - suspected segment of BE, no BE on biopsies EGD 2006 - esophagitis and gastritis, no evidence of BE Colonoscopy 5/05 - diverticulosis Colonoscopy 4/15 - 4 small adenomas  Prior imaging: [name] 03/16/19 - suggestive of cirrhosis, post chole CT scan 06/17/18 - thickening of the esophagus unchanged from 2013, "finely irregular liver surface, cannot exclude cirrhosis", no mass lesions US abdomen - 06/13/2015 - fatty liver   Past Medical History:  Diagnosis Date  . Anxiety   . Arthritis    "back" (04/22/2018)  . Blood transfusion without reported diagnosis   . CAD (coronary artery disease)    a. 03/2018 s/p PCI/DES to the RCA (3.0x15 Onyx DES).  . Carotid artery stenosis    Mild  . Chronic lower back pain   . Cirrhosis ([place])   . Colon polyps   . Diverticulitis   . Diverticulosis   . Esophageal thickening    seen  on pre TAVR CT scan, also questionable cirrhosis. MRI recommended. Will refer to GI  . GERD (gastroesophageal reflux disease)   . Grave's disease   . History of colonic polyps 05/22/2017  . History of hiatal hernia   . Hypertension   . Hypothyroidism   . IBS (irritable bowel syndrome)   . Osteopenia   . Pulmonary nodules    seen on pre TAVR CT. likley benign. no follow up recommended if pt low risk.  . S/P TAVR (transcatheter aortic valve replacement)   . Severe aortic stenosis   . Stroke ([place])   . Thalassemia minor   . Type II  diabetes mellitus ([place])      Past Surgical History:  Procedure Laterality Date  . [place] STUDY N/A 03/03/2018   Procedure: [place] STUDY;  Surgeon: Mauri Pole, MD;  Location: [facility];  Service: Endoscopy;  Laterality: N/A;  . COLONOSCOPY    . COLONOSCOPY W/ BIOPSIES AND POLYPECTOMY    . CORONARY ANGIOGRAPHY Right 04/21/2018   Procedure: CORONARY ANGIOGRAPHY (CATH LAB);  Surgeon: [name], MD;  Location: [place] [facility];  Service: Cardiovascular;  Laterality: Right;  . CORONARY STENT INTERVENTION N/A 04/22/2018   Procedure: CORONARY STENT INTERVENTION;  Surgeon: [name], MD;  Location: [place] [facility];  Service: Cardiovascular;  Laterality: N/A;  . DILATION AND CURETTAGE OF UTERUS    . ESOPHAGEAL MANOMETRY N/A 03/03/2018   Procedure: ESOPHAGEAL MANOMETRY (EM);  Surgeon: Mauri Pole, MD;  Location: [facility];  Service: Endoscopy;  Laterality: N/A;  . GASTRIC FUNDOPLICATION    . HERNIA REPAIR    . HYSTEROSCOPY     fibroids  . LAPAROSCOPIC CHOLECYSTECTOMY    . LAPAROSCOPY     fibroids  . NISSEN FUNDOPLICATION  6283M  . POLYPECTOMY    . RIGHT/LEFT HEART CATH AND CORONARY ANGIOGRAPHY N/A 02/20/2018   Procedure: RIGHT/LEFT HEART CATH AND CORONARY ANGIOGRAPHY;  Surgeon: [name], MD;  Location: [place] [facility];  Service: Cardiovascular;  Laterality: N/A;  . TEE WITHOUT CARDIOVERSION N/A 07/08/2018   Procedure: TRANSESOPHAGEAL ECHOCARDIOGRAM (TEE);  Surgeon: Burnell Blanks, MD;  Location: [place];  Service: Open Heart Surgery;  Laterality: N/A;  . TEE WITHOUT CARDIOVERSION  10/07/2018  . TEE WITHOUT CARDIOVERSION N/A 10/07/2018   Procedure: TRANSESOPHAGEAL ECHOCARDIOGRAM (TEE);  Surgeon: Jerline Pain, MD;  Location: [facility] END[facility];  Service: Cardiovascular;  Laterality: N/A;  . TONSILLECTOMY    . TRANSCATHETER AORTIC VALVE REPLACEMENT, TRANSFEMORAL N/A 07/08/2018   Procedure: TRANSCATHETER AORTIC VALVE REPLACEMENT,  TRANSFEMORAL;  Surgeon: Burnell Blanks, MD;  Location: [place];  Service: Open Heart Surgery;  Laterality: N/A;   Family History  Adopted: Yes  Problem Relation Age of Onset  . Healthy Son        x 2  . Headache Other        Cluster headaches  . Heart failure Mother   . Colon cancer Neg Hx   . Pancreatic cancer Neg Hx   . Rectal cancer Neg Hx   . Stomach cancer Neg Hx    Social History   Tobacco Use  . Smoking status: Never Smoker  . Smokeless tobacco: Never Used  Substance Use Topics  . Alcohol use: No    Alcohol/week: 0.0 standard drinks  . Drug use: Never   Current Outpatient Medications  Medication Sig Dispense Refill  . amitriptyline (ELAVIL) 25 MG tablet TAKE 1 TABLET(25 MG) BY MOUTH AT BEDTIME 90  tablet 1  . aspirin 81 MG tablet Take 81 mg by mouth daily.      . BD INSULIN SYRINGE U/F 31G X 5/16" 1 ML MISC USE TO INJECT INSULIN EVERY DAY 100 each 3  . cholecalciferol (VITAMIN D) 1000 units tablet Take 1,000 Units by mouth daily.    Marland Kitchen diltiazem (CARDIZEM CD) 120 MG 24 hr capsule TAKE 1 CAPSULE(120 MG) BY MOUTH DAILY 90 capsule 1  . escitalopram (LEXAPRO) 20 MG tablet TAKE 1 TABLET BY MOUTH EVERY DAY 90 tablet 1  . hydrochlorothiazide (HYDRODIURIL) 12.5 MG tablet Take 1 tablet (12.5 mg total) by mouth daily. 90 tablet 1  . insulin NPH Human (HUMULIN N) 100 UNIT/ML injection Inject 1 mL (100 Units total) into the skin daily before breakfast. 30 mL 0  . Lancets (ONETOUCH DELICA PLUS PVXYIA16P) MISC USE TO CHECK BLOOD SUGAR TWICE DAILY.. 200 each 0  . levothyroxine (SYNTHROID) 175 MCG tablet Take 1 tablet (175 mcg total) by mouth daily before breakfast. MUST CALL TO SCHEDULE APPT 30 tablet 0  . losartan (COZAAR) 50 MG tablet TAKE 1 TABLET(50 MG) BY MOUTH DAILY 90 tablet 1  . Melatonin 5 MG TABS Take 5 mg by mouth at bedtime as needed.     . metFORMIN (GLUCOPHAGE) 500 MG tablet Take 500 mg by mouth 2 (two) times daily with a meal.     . Multiple Vitamin (MULITIVITAMIN  WITH MINERALS) TABS Take 1 tablet by mouth daily.    . nitroGLYCERIN (NITROSTAT) 0.4 MG SL tablet Place 1 tablet (0.4 mg total) under the tongue every 5 (five) minutes as needed for chest pain. 25 tablet 3  . ONETOUCH VERIO test strip USE TO MONITOR GLUCOSE LEVELS TWICE DAILY 200 strip 3  . pantoprazole (PROTONIX) 40 MG tablet Take 1 tablet (40 mg total) by mouth daily. 90 tablet 3  . apixaban (ELIQUIS) 5 MG TABS tablet Take 1 tablet (5 mg total) by mouth 2 (two) times daily. 180 tablet 1   No current facility-administered medications for this visit.   Allergies  Allergen Reactions  . Statins Other (See Comments)    Muscle aches     Review of Systems: All systems reviewed and negative except where noted in HPI.   Lab Results  Component Value Date   WBC 5.1 11/17/2018   HGB 10.4 (L) 11/17/2018   HCT 34.4 (L) 11/17/2018   MCV 62.1 (L) 11/17/2018   PLT 178 11/17/2018    Lab Results  Component Value Date   CREATININE 1.0 12/01/2018   BUN 18 12/01/2018   NA 139 12/01/2018   K 4.2 12/01/2018   CL 99 11/17/2018   CO2 24 11/17/2018    Lab Results  Component Value Date   ALT 69 (A) 12/01/2018   AST 60 (A) 12/01/2018   ALKPHOS 115 12/01/2018   BILITOT 0.9 10/05/2018     Physical Exam: BP 118/62   Pulse 83   Temp 97.6 F (36.4 C)   Ht 5' 5"  (1.651 m)   Wt 192 lb 9.6 oz (87.4 kg)   SpO2 98%   BMI 32.05 kg/m  Constitutional: Pleasant,well-developed, female in no acute distress. HEENT: Normocephalic and atraumatic. Conjunctivae are normal. No scleral icterus. Neck supple.  Cardiovascular: Normal rate, regular rhythm.  Pulmonary/chest: Effort normal and breath sounds normal. No wheezing, rales or rhonchi. Abdominal: Soft, protuberant, nontender.There are no masses palpable. No hepatomegaly. Extremities: no edema Lymphadenopathy: No cervical adenopathy noted. Neurological: Alert and oriented to person place and time.  Skin: Skin is warm and dry. No rashes  noted. Psychiatric: Normal mood and affect. Behavior is normal.   ASSESSMENT AND PLAN: 75 year old female here for reassessment the following:  Cirrhosis / fatty liver - she has a history of elevated liver enzymes presumed due to fatty liver in the past based off extensive lab evaluation and imaging.  Based on her last 2 imaging studies it appears she may have developed cirrhosis.  She is compensated at this time.  I discussed cirrhosis with her in detail, risks for decompensation and HCC.  I discussed fatty liver disease with her.  She is not drinking any alcohol and she will continue to abstain.  I do think she would benefit from weight loss but has had a very hard time doing this.  She was agreeable to a referral to the [facility] weight loss clinic to see if this would provide her any benefit.  She is otherwise due for some labs today, will check AFP and INR and repeat her LFTs.  I will need to see her every 6 months for this issue, will plan for another ultrasound in 6 months for [facility] screening.  Her EGD is up-to-date without any evidence of varices.  Barrett's esophagus - very short segment of nondysplastic Barrett's. Reassured her that risk of this progressing to a concerning lesion is quite low. She is taking Protonix 40 mg once a day for management of reflux and this issue.  She is tolerating it well.  I discussed long-term risks and benefits of chronic PPI use, I feel the benefits outweigh the risks as a provides good control symptoms/quality of life and may reduce her risk of esophageal cancer.  She will continue Protonix at this time.  We will consider repeat EGD in 3 to 5 years for surveillance pending evaluation at that time, and the status of her other medical problems.  History of colon polyps - numerous adenomas on her last 2 colonoscopies.  Her genetic testing was negative.  She is due for another surveillance colonoscopy in 3 years if she wishes to continue with surveillance at that  time.  Chatfield Cellar, MD [facility]  I spent 30-39 minutes of time, including in depth chart review, independent review of results as outlined above, communicating results with the patient directly, face-to-face time with the patient, coordinating care, and ordering studies and medications as appropriate. Greater than 50% of the time was spent counseling and coordinating care.

## 2019-04-30 ENCOUNTER — Other Ambulatory Visit: Payer: Self-pay

## 2019-04-30 ENCOUNTER — Inpatient Hospital Stay: Payer: Medicare Other

## 2019-04-30 ENCOUNTER — Inpatient Hospital Stay: Payer: Medicare Other | Attending: Hematology | Admitting: Hematology

## 2019-04-30 ENCOUNTER — Telehealth: Payer: Self-pay | Admitting: Hematology

## 2019-04-30 VITALS — BP 132/53 | HR 92 | Temp 98.1°F | Resp 18 | Ht 65.0 in | Wt 193.6 lb

## 2019-04-30 DIAGNOSIS — E039 Hypothyroidism, unspecified: Secondary | ICD-10-CM | POA: Diagnosis not present

## 2019-04-30 DIAGNOSIS — Z955 Presence of coronary angioplasty implant and graft: Secondary | ICD-10-CM | POA: Diagnosis not present

## 2019-04-30 DIAGNOSIS — K589 Irritable bowel syndrome without diarrhea: Secondary | ICD-10-CM | POA: Diagnosis not present

## 2019-04-30 DIAGNOSIS — K219 Gastro-esophageal reflux disease without esophagitis: Secondary | ICD-10-CM | POA: Insufficient documentation

## 2019-04-30 DIAGNOSIS — R0602 Shortness of breath: Secondary | ICD-10-CM | POA: Insufficient documentation

## 2019-04-30 DIAGNOSIS — M1990 Unspecified osteoarthritis, unspecified site: Secondary | ICD-10-CM | POA: Insufficient documentation

## 2019-04-30 DIAGNOSIS — E119 Type 2 diabetes mellitus without complications: Secondary | ICD-10-CM | POA: Diagnosis not present

## 2019-04-30 DIAGNOSIS — M8580 Other specified disorders of bone density and structure, unspecified site: Secondary | ICD-10-CM | POA: Insufficient documentation

## 2019-04-30 DIAGNOSIS — Z7901 Long term (current) use of anticoagulants: Secondary | ICD-10-CM | POA: Diagnosis not present

## 2019-04-30 DIAGNOSIS — D649 Anemia, unspecified: Secondary | ICD-10-CM

## 2019-04-30 DIAGNOSIS — Z952 Presence of prosthetic heart valve: Secondary | ICD-10-CM | POA: Diagnosis not present

## 2019-04-30 DIAGNOSIS — K7460 Unspecified cirrhosis of liver: Secondary | ICD-10-CM | POA: Insufficient documentation

## 2019-04-30 DIAGNOSIS — I2510 Atherosclerotic heart disease of native coronary artery without angina pectoris: Secondary | ICD-10-CM | POA: Insufficient documentation

## 2019-04-30 DIAGNOSIS — K760 Fatty (change of) liver, not elsewhere classified: Secondary | ICD-10-CM | POA: Insufficient documentation

## 2019-04-30 DIAGNOSIS — D509 Iron deficiency anemia, unspecified: Secondary | ICD-10-CM | POA: Diagnosis not present

## 2019-04-30 DIAGNOSIS — D563 Thalassemia minor: Secondary | ICD-10-CM | POA: Diagnosis not present

## 2019-04-30 DIAGNOSIS — Z923 Personal history of irradiation: Secondary | ICD-10-CM | POA: Diagnosis not present

## 2019-04-30 DIAGNOSIS — Z8673 Personal history of transient ischemic attack (TIA), and cerebral infarction without residual deficits: Secondary | ICD-10-CM | POA: Insufficient documentation

## 2019-04-30 DIAGNOSIS — Z7982 Long term (current) use of aspirin: Secondary | ICD-10-CM | POA: Insufficient documentation

## 2019-04-30 DIAGNOSIS — Z794 Long term (current) use of insulin: Secondary | ICD-10-CM | POA: Diagnosis not present

## 2019-04-30 DIAGNOSIS — Z79899 Other long term (current) drug therapy: Secondary | ICD-10-CM | POA: Diagnosis not present

## 2019-04-30 DIAGNOSIS — E538 Deficiency of other specified B group vitamins: Secondary | ICD-10-CM

## 2019-04-30 DIAGNOSIS — F419 Anxiety disorder, unspecified: Secondary | ICD-10-CM | POA: Insufficient documentation

## 2019-04-30 DIAGNOSIS — I10 Essential (primary) hypertension: Secondary | ICD-10-CM | POA: Insufficient documentation

## 2019-04-30 LAB — CMP (CANCER CENTER ONLY)
ALT: 50 U/L — ABNORMAL HIGH (ref 0–44)
AST: 53 U/L — ABNORMAL HIGH (ref 15–41)
Albumin: 4.1 g/dL (ref 3.5–5.0)
Alkaline Phosphatase: 94 U/L (ref 38–126)
Anion gap: 12 (ref 5–15)
BUN: 22 mg/dL (ref 8–23)
CO2: 24 mmol/L (ref 22–32)
Calcium: 10.2 mg/dL (ref 8.9–10.3)
Chloride: 103 mmol/L (ref 98–111)
Creatinine: 0.9 mg/dL (ref 0.44–1.00)
GFR, Est AFR Am: >60 mL/min (ref >60)
GFR, Estimated: >60 mL/min (ref >60)
Glucose, Bld: 163 mg/dL — ABNORMAL HIGH (ref 70–99)
Potassium: 4 mmol/L (ref 3.5–5.1)
Sodium: 139 mmol/L (ref 135–145)
Total Bilirubin: 0.5 mg/dL (ref 0.3–1.2)
Total Protein: 7.7 g/dL (ref 6.5–8.1)

## 2019-04-30 LAB — T4, FREE: Free T4: 0.89 ng/dL (ref 0.61–1.12)

## 2019-04-30 LAB — CBC WITH DIFFERENTIAL/PLATELET
Abs Immature Granulocytes: 0.02 10*3/uL (ref 0.00–0.07)
Basophils Absolute: 0 10*3/uL (ref 0.0–0.1)
Basophils Relative: 1 %
Eosinophils Absolute: 0.2 10*3/uL (ref 0.0–0.5)
Eosinophils Relative: 3 %
HCT: 30.4 % — ABNORMAL LOW (ref 36.0–46.0)
Hemoglobin: 8.9 g/dL — ABNORMAL LOW (ref 12.0–15.0)
Immature Granulocytes: 0 %
Lymphocytes Relative: 22 %
Lymphs Abs: 1.5 10*3/uL (ref 0.7–4.0)
MCH: 16.3 pg — ABNORMAL LOW (ref 26.0–34.0)
MCHC: 29.3 g/dL — ABNORMAL LOW (ref 30.0–36.0)
MCV: 55.7 fL — ABNORMAL LOW (ref 80.0–100.0)
Monocytes Absolute: 0.5 10*3/uL (ref 0.1–1.0)
Monocytes Relative: 7 %
Neutro Abs: 4.7 10*3/uL (ref 1.7–7.7)
Neutrophils Relative %: 67 %
Platelets: 208 10*3/uL (ref 150–400)
RBC: 5.46 MIL/uL — ABNORMAL HIGH (ref 3.87–5.11)
RDW: 19.6 % — ABNORMAL HIGH (ref 11.5–15.5)
WBC: 7 10*3/uL (ref 4.0–10.5)
nRBC: 0 % (ref 0.0–0.2)

## 2019-04-30 LAB — FERRITIN: Ferritin: 10 ng/mL — ABNORMAL LOW (ref 11–307)

## 2019-04-30 LAB — LACTATE DEHYDROGENASE: LDH: 194 U/L — ABNORMAL HIGH (ref 98–192)

## 2019-04-30 LAB — IRON AND TIBC
Iron: 27 ug/dL — ABNORMAL LOW (ref 41–142)
Saturation Ratios: 6 % — ABNORMAL LOW (ref 21–57)
TIBC: 485 ug/dL — ABNORMAL HIGH (ref 236–444)
UIBC: 458 ug/dL — ABNORMAL HIGH (ref 120–384)

## 2019-04-30 LAB — TSH: TSH: 5.935 u[IU]/mL — ABNORMAL HIGH (ref 0.308–3.960)

## 2019-04-30 LAB — AFP TUMOR MARKER: AFP-Tumor Marker: 3 ng/mL

## 2019-04-30 LAB — VITAMIN B12: Vitamin B-12: 575 pg/mL (ref 180–914)

## 2019-04-30 NOTE — Patient Instructions (Signed)
Thank you for choosing [facility] to provide your oncology and hematology care.   Should you have questions after your visit to the [facility]), please contact this office at [phone] between 8:30 AM and 4:30 PM.  Voice mails left after 4:00 PM may not be returned until the following business day.  Calls received after 4:30 PM will be answered by an off-site Nurse Triage Line.    Prescription Refills:  Please have your pharmacy contact us directly for most prescription requests.  Contact the office directly for refills of narcotics (pain medications). Allow 48-72 hours for refills.  Appointments: Please contact the [facility] scheduling department [phone] for questions regarding [facility] appointment scheduling.  Contact the schedulers with any scheduling changes so that your appointment can be rescheduled in a timely manner.   Central Scheduling for [facility] [phone] - Call to schedule procedures such as PET scans, CT scans, MRI, Ultrasound, etc.  To afford each patient quality time with our providers, please arrive 30 minutes before your scheduled appointment time.  If you arrive late for your appointment, you may be asked to reschedule.  We strive to give you quality time with our providers, and arriving late affects you and other patients whose appointments are after yours. If you are a no show for multiple scheduled visits, you may be dismissed from the clinic at the providers discretion.     Resources: [employer] [phone] for additional information on assistance programs --[place]  [phone]: Information regarding food stamps, Medicaid, and utility assistance [employer] [phone]   [place] [employer]'s shared-ride transportation service for eligible riders who have a disability that prevents them from riding the fixed route bus.   [place] ([phone] Helps people with  Medicare understand their rights and benefits, navigate the Medicare system, and secure the quality healthcare they deserve [employer] ([phone] Assists patients locate various types of support and financial assistance Cancer Care: 1-800-813-HOPE (770) 283-9456) Provides financial assistance, online support groups, medication/co-pay assistance.   Transportation Assistance for appointments at [facility]: [employer] [phone]  Again, thank you for choosing [facility] for your care.

## 2019-04-30 NOTE — Progress Notes (Signed)
HEMATOLOGY/ONCOLOGY CONSULTATION NOTE  Date of Service: 04/30/2019  Patient Care Team: [name], NP as PCP - General (Family Medicine) [name], MD as PCP - Cardiology (Cardiology) Tobi Bastos, RN as [place] Management Madelin Rear, MD as Consulting Physician (Endocrinology)  REFERRING PHYSICIAN: [name], NP  CHIEF COMPLAINTS/PURPOSE OF CONSULTATION:   Thalassemia minor   HISTORY OF PRESENTING ILLNESS:   Breanna Webster is a wonderful 74 y.o. female who has been referred to [name] by [name], NP for evaluation and management of Thalassemia minor. She is accompanied today by her husband. The pt reports that she is doing well overall.   The pt reports On 04/22/2018 she had Cardiac catheterization. The   07/08/2018- Infection around the heart, endocarditis.  Mitral valve infections.  She had a stroke in April 2020.   She gets shortness of breath when walking more than 50 steps. Before April 2020 she was very active. She is also having difficulty with balance.  Doctors are unsure as to why she is having difficulty breathing.  She has had radiation treatment to half of her thyroid.  She has not noticed any improvement since her valve placement  She has never had IV transfusions   She use to take B12 shots in her 72s. She is unsure why they were stopped. Possibly stopped because tests showed she no longer needed B12.   She reports that she was diagnosed with a fatty liver.  She is taking synthroid 125 MCG and eliquis.    Most recent lab results (04/28/2018) of CBC is as follows: all values are WNL except for RBC at 5.44, Hemoglobin at 9.0, Hct at 29.8, MCV at 54.8, RDW at 19.8, Monocytes Relative 2.0, ALT at 39, INR at 1.6, Prothrombin Time at 18.4.  On review of systems, pt reports shortness of breath when walking or being active, fatigue, steady weight, and denies chest pain, leg swelling and any other symptoms.    On PMHx the pt reports liver cirrhosis, diagnosed with Thalassemia minor 20 years ago. Diabetes.   On Social Hx the pt reports never smoked. Never consumed alcohol.   On Family Hx the pt reports son has Thalassemia minor.  MEDICAL HISTORY:  Past Medical History:  Diagnosis Date  . Anxiety   . Arthritis    "back" (04/22/2018)  . Blood transfusion without reported diagnosis   . CAD (coronary artery disease)    a. 03/2018 s/p PCI/DES to the RCA (3.0x15 Onyx DES).  . Carotid artery stenosis    Mild  . Chronic lower back pain   . Cirrhosis ([place])   . Colon polyps   . Diverticulitis   . Diverticulosis   . Esophageal thickening    seen on pre TAVR CT scan, also questionable cirrhosis. MRI recommended. Will refer to GI  . GERD (gastroesophageal reflux disease)   . Grave's disease   . History of colonic polyps 05/22/2017  . History of hiatal hernia   . Hypertension   . Hypothyroidism   . IBS (irritable bowel syndrome)   . Osteopenia   . Pulmonary nodules    seen on pre TAVR CT. likley benign. no follow up recommended if pt low risk.  . S/P TAVR (transcatheter aortic valve replacement)   . Severe aortic stenosis   . Stroke ([place])   . Thalassemia minor   . Type II diabetes mellitus ([place])      SURGICAL HISTORY: Past Surgical History:  Procedure Laterality Date  .  [place] STUDY N/A 03/03/2018   Procedure: [place] STUDY;  Surgeon: Mauri Pole, MD;  Location: [facility];  Service: Endoscopy;  Laterality: N/A;  . COLONOSCOPY    . COLONOSCOPY W/ BIOPSIES AND POLYPECTOMY    . CORONARY ANGIOGRAPHY Right 04/21/2018   Procedure: CORONARY ANGIOGRAPHY (CATH LAB);  Surgeon: [name], MD;  Location: [place] CV LAB;  Service: Cardiovascular;  Laterality: Right;  . CORONARY STENT INTERVENTION N/A 04/22/2018   Procedure: CORONARY STENT INTERVENTION;  Surgeon: [name], MD;  Location: [place] CV LAB;  Service: Cardiovascular;  Laterality: N/A;  . DILATION  AND CURETTAGE OF UTERUS    . ESOPHAGEAL MANOMETRY N/A 03/03/2018   Procedure: ESOPHAGEAL MANOMETRY (EM);  Surgeon: Mauri Pole, MD;  Location: [facility];  Service: Endoscopy;  Laterality: N/A;  . GASTRIC FUNDOPLICATION    . HERNIA REPAIR    . HYSTEROSCOPY     fibroids  . LAPAROSCOPIC CHOLECYSTECTOMY    . LAPAROSCOPY     fibroids  . NISSEN FUNDOPLICATION  [id number]  . POLYPECTOMY    . RIGHT/LEFT HEART CATH AND CORONARY ANGIOGRAPHY N/A 02/20/2018   Procedure: RIGHT/LEFT HEART CATH AND CORONARY ANGIOGRAPHY;  Surgeon: [name], MD;  Location: [place] CV LAB;  Service: Cardiovascular;  Laterality: N/A;  . TEE WITHOUT CARDIOVERSION N/A 07/08/2018   Procedure: TRANSESOPHAGEAL ECHOCARDIOGRAM (TEE);  Surgeon: Burnell Blanks, MD;  Location: [place];  Service: Open Heart Surgery;  Laterality: N/A;  . TEE WITHOUT CARDIOVERSION  10/07/2018  . TEE WITHOUT CARDIOVERSION N/A 10/07/2018   Procedure: TRANSESOPHAGEAL ECHOCARDIOGRAM (TEE);  Surgeon: Jerline Pain, MD;  Location: [facility] ENDOSCOPY;  Service: Cardiovascular;  Laterality: N/A;  . TONSILLECTOMY    . TRANSCATHETER AORTIC VALVE REPLACEMENT, TRANSFEMORAL N/A 07/08/2018   Procedure: TRANSCATHETER AORTIC VALVE REPLACEMENT, TRANSFEMORAL;  Surgeon: Burnell Blanks, MD;  Location: [place];  Service: Open Heart Surgery;  Laterality: N/A;     SOCIAL HISTORY: Social History   Socioeconomic History  . Marital status: Married    Spouse name: Not on file  . Number of children: 2  . Years of education: Not on file  . Highest education level: Not on file  Occupational History  . Occupation: Tree surgeon of the [employer]  Tobacco Use  . Smoking status: Never Smoker  . Smokeless tobacco: Never Used  Substance and Sexual Activity  . Alcohol use: No    Alcohol/week: 0.0 standard drinks  . Drug use: Never  . Sexual activity: Not Currently  Other Topics Concern  . Not on file  Social History Narrative   Lives  with husband in a one story home.     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   Education: college.   Social Determinants of Health   Financial Resource Strain: Low Risk   . Difficulty of Paying Living Expenses: Not hard at all  Food Insecurity:   . Worried About Charity fundraiser in the Last Year: Not on file  . Ran Out of Food in the Last Year: Not on file  Transportation Needs: No Transportation Needs  . Lack of Transportation (Medical): No  . Lack of Transportation (Non-Medical): No  Physical Activity:   . Days of Exercise per Week: Not on file  . Minutes of Exercise per Session: Not on file  Stress: Stress Concern Present  . Feeling of Stress : To some extent  Social Connections:   . Frequency of Communication with Friends and Family: Not on file  . Frequency of Social Gatherings with Friends and Family: Not on file  . Attends Religious Services: Not on file  . Active Member of Clubs or Organizations: Not on file  . Attends Archivist Meetings: Not on file  . Marital Status: Not on file  Intimate Partner Violence:   . Fear of Current or Ex-Partner: Not on file  . Emotionally Abused: Not on file  . Physically Abused: Not on file  . Sexually Abused: Not on file     FAMILY HISTORY: Family History  Adopted: Yes  Problem Relation Age of Onset  . Healthy Son        x 2  . Headache Other        Cluster headaches  . Heart failure Mother   . Colon cancer Neg Hx   . Pancreatic cancer Neg Hx   . Rectal cancer Neg Hx   . Stomach cancer Neg Hx    ALLERGIES:   is allergic to statins.   MEDICATIONS:  Current Outpatient Medications  Medication Sig Dispense Refill  . amitriptyline (ELAVIL) 25 MG tablet TAKE 1 TABLET(25 MG) BY MOUTH AT BEDTIME 90 tablet 1  . apixaban (ELIQUIS) 5 MG TABS tablet Take 1 tablet (5 mg total) by mouth 2 (two) times daily. 180 tablet 1  . aspirin 81 MG tablet Take  81 mg by mouth daily.      . BD INSULIN SYRINGE U/F 31G X 5/16" 1 ML MISC USE TO INJECT INSULIN EVERY DAY 100 each 3  . cholecalciferol (VITAMIN D) 1000 units tablet Take 1,000 Units by mouth daily.    Marland Kitchen diltiazem (CARDIZEM CD) 120 MG 24 hr capsule TAKE 1 CAPSULE(120 MG) BY MOUTH DAILY 90 capsule 1  . escitalopram (LEXAPRO) 20 MG tablet TAKE 1 TABLET BY MOUTH EVERY DAY 90 tablet 1  . hydrochlorothiazide (HYDRODIURIL) 12.5 MG tablet Take 1 tablet (12.5 mg total) by mouth daily. 90 tablet 1  . insulin NPH Human (HUMULIN N) 100 UNIT/ML injection Inject 1 mL (100 Units total) into the skin daily before breakfast. 30 mL 0  . Lancets (ONETOUCH DELICA PLUS [id number]) MISC USE TO CHECK BLOOD SUGAR TWICE DAILY.. 200 each 0  . levothyroxine (SYNTHROID) 175 MCG tablet Take 1 tablet (175 mcg total) by mouth daily before breakfast. MUST CALL TO SCHEDULE APPT 30 tablet 0  . losartan (COZAAR) 50 MG tablet TAKE 1 TABLET(50 MG) BY MOUTH DAILY 90 tablet 1  . Melatonin 5 MG TABS Take 5 mg by mouth at bedtime as needed.     . metFORMIN (GLUCOPHAGE) 500 MG tablet Take 500 mg by mouth 2 (two) times daily with a meal.     . Multiple Vitamin (MULITIVITAMIN WITH MINERALS) TABS Take 1 tablet by mouth daily.    . nitroGLYCERIN (NITROSTAT) 0.4 MG SL tablet Place 1 tablet (0.4 mg total) under the tongue every 5 (five) minutes as needed for chest pain. 25 tablet 3  . ONETOUCH VERIO test strip USE TO MONITOR GLUCOSE LEVELS TWICE DAILY 200 strip 3  . pantoprazole (PROTONIX) 40 MG tablet Take 1 tablet (40 mg total) by mouth daily. 90 tablet 3   No current facility-administered medications for this visit.     REVIEW OF SYSTEMS:   A 10+ POINT REVIEW OF SYSTEMS WAS OBTAINED including neurology, dermatology, psychiatry, cardiac, respiratory, lymph, extremities, GI, GU, Musculoskeletal, constitutional, breasts, reproductive, HEENT.  All pertinent positives  are noted in the HPI.  All others are negative.    PHYSICAL  EXAMINATION: ECOG PERFORMANCE STATUS: 2 - Symptomatic, <50% confined to bed  Vitals:   04/30/19 1038  BP: (!) 132/53  Pulse: 92  Resp: 18  Temp: 98.1 F (36.7 C)  SpO2: 99%   Filed Weights   04/30/19 1038  Weight: 193 lb 9.6 oz (87.8 kg)   Body mass index is 32.22 kg/m.  GENERAL:alert, in no acute distress and comfortable SKIN: no acute rashes, no significant lesions EYES: conjunctiva are pink and non-injected, sclera anicteric OROPHARYNX: MMM, no exudates, no oropharyngeal erythema or ulceration NECK: supple, no JVD LYMPH:  no palpable lymphadenopathy in the cervical, axillary or inguinal regions LUNGS: clear to auscultation b/l with normal respiratory effort HEART: regular rate & rhythm, clicks in valve. ABDOMEN:  normoactive bowel sounds , non tender, not distended. Extremity: no pedal edema PSYCH: alert & oriented x 3 with fluent speech NEURO: no focal motor/sensory deficits   LABORATORY DATA:  I have reviewed the data as listed  CBC Latest Ref Rng & Units 04/30/2019 04/30/2019 04/29/2019  WBC 4.0 - 10.5 K/uL 7.0 - 7.2  Hemoglobin 12.0 - 15.0 g/dL 8.9(L) - 9.0 Repeated and verified X2.(L)  Hematocrit 34.0 - 46.6 % 31.6(L) 30.4(L) 29.8(L)  Platelets 150 - 400 K/uL 208 - 189.0   . CBC    Component Value Date/Time   WBC 7.0 04/30/2019 1148   RBC 5.46 (H) 04/30/2019 1148   HGB 8.9 (L) 04/30/2019 1148   HGB 11.0 (L) 11/12/2018 0939   HCT 31.6 (L) 04/30/2019 1149   PLT 208 04/30/2019 1148   PLT 238 11/12/2018 0939   MCV 55.7 (L) 04/30/2019 1148   MCV 65 (L) 11/12/2018 0939   MCH 16.3 (L) 04/30/2019 1148   MCHC 29.3 (L) 04/30/2019 1148   RDW 19.6 (H) 04/30/2019 1148   RDW 18.2 (H) 11/12/2018 0939   LYMPHSABS 1.5 04/30/2019 1148   LYMPHSABS 1.2 02/26/2017 1054   MONOABS 0.5 04/30/2019 1148   EOSABS 0.2 04/30/2019 1148   EOSABS 0.1 02/26/2017 1054   BASOSABS 0.0 04/30/2019 1148   BASOSABS 0.0 02/26/2017 1054   . CMP Latest Ref Rng & Units 04/30/2019 04/29/2019  12/01/2018  Glucose 70 - 99 mg/dL 163(H) - -  BUN 8 - 23 mg/dL 22 - 18  Creatinine 0.44 - 1.00 mg/dL 0.90 - 1.0  Sodium 135 - 145 mmol/L 139 - 139  Potassium 3.5 - 5.1 mmol/L 4.0 - 4.2  Chloride 98 - 111 mmol/L 103 - -  CO2 22 - 32 mmol/L 24 - -  Calcium 8.9 - 10.3 mg/dL 10.2 - -  Total Protein 6.5 - 8.1 g/dL 7.7 7.4 -  Total Bilirubin 0.3 - 1.2 mg/dL 0.5 0.5 -  Alkaline Phos 38 - 126 U/L 94 81 115  AST 15 - 41 U/L 53(H) 36 60(A)  ALT 0 - 44 U/L 50(H) 39(H) 69(A)   . Lab Results  Component Value Date   IRON 27 (L) 04/30/2019   TIBC 485 (H) 04/30/2019   IRONPCTSAT 6 (L) 04/30/2019   (Iron and TIBC)  Lab Results  Component Value Date   FERRITIN 10 (L) 04/30/2019   Component     Latest Ref Rng & Units 04/30/2019  IgG (Immunoglobin G), Serum     586 - 1,602 mg/dL 1,299  IgA     64 - 422 mg/dL 342  IgM (Immunoglobulin M), Srm     26 - 217 mg/dL 108  Total Protein ELP     6.0 - 8.5 g/dL 7.0  Albumin SerPl Elph-Mcnc     2.9 - 4.4 g/dL 3.5  Alpha 1     0.0 - 0.4 g/dL 0.2  Alpha2 Glob SerPl Elph-Mcnc     0.4 - 1.0 g/dL 0.7  B-Globulin SerPl Elph-Mcnc     0.7 - 1.3 g/dL 1.3  Gamma Glob SerPl Elph-Mcnc     0.4 - 1.8 g/dL 1.3  M Protein SerPl Elph-Mcnc     Not Observed g/dL Not Observed  Globulin, Total     2.2 - 3.9 g/dL 3.5  Albumin/Glob SerPl     0.7 - 1.7 1.1  IFE 1      Comment  Please Note (HCV):      Comment  Hgb A2 Quant     1.8 - 3.2 % 4.1 (H)  Hgb F Quant     0.0 - 2.0 % 0.0  Hgb S Quant     0.0 % 0.0  HGB C     0.0 % 0.0  Hgb A     96.4 - 98.8 % 95.9 (L)  HGB VARIANT     0.0 % 0.0  Please Note:      Comment  Iron     41 - 142 ug/dL 27 (L)  TIBC     236 - 444 ug/dL 485 (H)  Saturation Ratios     21 - 57 % 6 (L)  UIBC     120 - 384 ug/dL 458 (H)  Folate, Hemolysate     Not Estab. ng/mL 463.0  HCT     34.0 - 46.6 % 31.6 (L)  Folate, RBC     >498 ng/mL 1,465  Haptoglobin     42 - 346 mg/dL 65  LDH     98 - 192 U/L 194 (H)   T4,Free(Direct)     0.61 - 1.12 ng/dL 0.89  TSH     0.308 - 3.960 uIU/mL 5.935 (H)  Vitamin B12     180 - 914 pg/mL 575  Ferritin     11 - 307 ng/mL 10 (L)    RADIOGRAPHIC STUDIES: I have personally reviewed the radiological images as listed and agreed with the findings in the report. No results found.   ASSESSMENT & PLAN:  ADDILEE NEU is a 74 y.o. female with:  1. Moderate Microcytic Anemia 2. H/o Beta thalassemia minor with baseline hgb likely in the 10-11 range 3. Iron deficiency PLAN: -Discussed patient's most recent labs from 04/29/2019, all values are WNL except for RBC at 5.44, Hemoglobin at 9.0, Hct at 29.8, MCV at 54.8, RDW at 19.8, Monocytes Relative 2.0, ALT at 39, INR at 1.6, Prothrombin Time at 18.4. -Discussed 04/29/2019 Hemoglobin at 9.0 -Discussed that she was referred to the clinic to try to optimize anemia. -hgb today at 8.9 with significant Iron deficiency ferritin of 10 and iron saturation of 6% -Discussed that previous polyps and current medications can cause bleeding. Recently completed GI w/u. -will offer option for IV Iorn replacement. -Advised that hemoglobin levels should not cause her significant symptoms though it could contribute to some fatigue . Iron deficiency can independent also call fatigue and decreased exercise tolerance. - Recommended following up with cardiologist and pulmonologist for continue evaluation and mx of SOB/DOE -optimize thyroid rep -Discussed that thalassemia minor can contribute to some of her anemia.  -Discussed medication and side effects that could be contributing to current symptoms. - Advised to take  thyroid medication separate from other medication and with out food.  -Recommended following up with PCP about medication list.     FOLLOW UP Labs today Phone visit in 2 weeks  All of the patients questions were answered with apparent satisfaction. The patient knows to call the clinic with any problems, questions or  concerns.  I spent 45 minutes counseling the patient face to face. The total time spent in the appointment was 60 minutes and more than 50% was on counseling and direct patient cares.    Sullivan Lone MD MS AAHIVMS [facility] Hematology/Oncology Physician [facility]  (Office):       [phone] (Work cell):  [phone] (Fax):           [phone]  04/30/2019 6:57 AM I, Scot Dock, am acting as a scribe for Dr. Sullivan Lone.   .I have reviewed the above documentation for accuracy and completeness, and I agree with the above. [name] MD

## 2019-04-30 NOTE — Telephone Encounter (Signed)
scheduled appt per 1/7 los - gave patient AVS and calender

## 2019-05-01 ENCOUNTER — Telehealth: Payer: Self-pay | Admitting: Adult Health

## 2019-05-01 LAB — HAPTOGLOBIN: Haptoglobin: 65 mg/dL (ref 42–346)

## 2019-05-01 LAB — FOLATE RBC
Folate, Hemolysate: 463 ng/mL
Folate, RBC: 1465 ng/mL (ref >498)
Hematocrit: 31.6 % — ABNORMAL LOW (ref 34.0–46.6)

## 2019-05-01 NOTE — Telephone Encounter (Signed)
Patient's husband came in to ask if Tommi Rumps thought pt would be strong enough after her history for the last 9 months for her to get the Covid vaccine.  Please call husband [name]) [phone]

## 2019-05-01 NOTE — Telephone Encounter (Signed)
Please advise 

## 2019-05-01 NOTE — Telephone Encounter (Signed)
She should be fine to get her COVID vaccination

## 2019-05-02 ENCOUNTER — Encounter: Payer: Self-pay | Admitting: Adult Health

## 2019-05-04 ENCOUNTER — Other Ambulatory Visit: Payer: Self-pay | Admitting: *Deleted

## 2019-05-04 ENCOUNTER — Encounter: Payer: Self-pay | Admitting: Adult Health

## 2019-05-04 LAB — MULTIPLE MYELOMA PANEL, SERUM
Albumin SerPl Elph-Mcnc: 3.5 g/dL (ref 2.9–4.4)
Albumin/Glob SerPl: 1.1 (ref 0.7–1.7)
Alpha 1: 0.2 g/dL (ref 0.0–0.4)
Alpha2 Glob SerPl Elph-Mcnc: 0.7 g/dL (ref 0.4–1.0)
B-Globulin SerPl Elph-Mcnc: 1.3 g/dL (ref 0.7–1.3)
Gamma Glob SerPl Elph-Mcnc: 1.3 g/dL (ref 0.4–1.8)
Globulin, Total: 3.5 g/dL (ref 2.2–3.9)
IgA: 342 mg/dL (ref 64–422)
IgG (Immunoglobin G), Serum: 1299 mg/dL (ref 586–1602)
IgM (Immunoglobulin M), Srm: 108 mg/dL (ref 26–217)
Total Protein ELP: 7 g/dL (ref 6.0–8.5)

## 2019-05-04 LAB — HEMOGLOBINOPATHY EVALUATION
Hgb A2 Quant: 4.1 % — ABNORMAL HIGH (ref 1.8–3.2)
Hgb A: 95.9 % — ABNORMAL LOW (ref 96.4–98.8)
Hgb C: 0 %
Hgb F Quant: 0 % (ref 0.0–2.0)
Hgb S Quant: 0 %
Hgb Variant: 0 %

## 2019-05-04 NOTE — Patient Outreach (Signed)
[facility] Care Management  05/04/2019  Breanna Webster July 20, 1945 [id number]    Telephone Assessment-Successful  RN spoke with pt today ad received a update on her ongoing management of care. Pt states she is about 80% back recovered and has completed PT rehab several weeks ago. States her speech is "good" and her balance "not perfect but no falls". Pt states she has not had to use any DME and ambulating with no problems. States she goes to the gym however can only ambulate about 50 feet before she experiences SOB. States she has underwent several test with no findings but currently pending a consult with a hematologist and her CAD provider Dr. Tamala Julian has made a referral to [facility] for further evaluation on the cause. Other issues mentioned was sleeping. Pt currently on Melatonin and RN has encouraged pt to discuss possible increase dosage or another intervention that maybe able to assist further on her next provider visit.  RN inquired on the Co-vid vaccine as pt states she is awaiting clearance from her provided and will obtain the vaccine from [facility].  RN reviewed the plan of care with update on the intervention and continue to offer needed resources as pt continue to manage her ongoing care. Will continue to follow up with pt on a quarterly follow up pending resolution to the above issues. Will update pt's provider today and schedule another call in 3 months.   [facility] CM Care Plan Problem One     Most Recent Value  Care Plan Problem One  Recent hospitalization  Role Documenting the Problem One  Care Management [place] for Problem One  Active  [facility] Long Term Goal   Pt will not have readmission over the next 90 days related to stroke.  [facility] Long Term Goal Start Date  10/28/18  Interventions for Problem One Long Term Goal  Will continute to re-evaluate pt's prevention measures concerning stroke prevention and provided needed resoruces to accommodate pt's ongoing  management of care.  Will continue to encourage adherence with her ongoing management of care.      [name], RN Care Management Coordinator [place] Office [phone]

## 2019-05-05 NOTE — Telephone Encounter (Signed)
Spoke with patient. Informed her of Cory's message. Patient verbalized understanding.

## 2019-05-07 ENCOUNTER — Telehealth: Payer: Self-pay | Admitting: *Deleted

## 2019-05-07 DIAGNOSIS — D509 Iron deficiency anemia, unspecified: Secondary | ICD-10-CM | POA: Insufficient documentation

## 2019-05-07 NOTE — Telephone Encounter (Signed)
TCT patient regarding lab results from 04/30/19. No answer but was able to leave vm message on identified phone for pt to call back @ [phone] to discuss her iron deficient anemia and proposed IV iron appts.

## 2019-05-07 NOTE — Telephone Encounter (Signed)
-----   Message from Rolland Bimler, RN sent at 05/07/2019  2:38 PM EST -----  ----- Message ----- From: [name], MD Sent: 05/07/2019  12:38 AM EST To: Rolland Bimler, RN  Plz let patient know she has significant iron deficiency anemia. Given her anemia, symptoms and cardiopulmonary co-morbids would recommend IV Iron replacement - IV Injectafer x 2. Orders are in- - can schedule if patient is okay with this. Thx GK

## 2019-05-08 ENCOUNTER — Telehealth: Payer: Self-pay

## 2019-05-08 NOTE — Telephone Encounter (Signed)
Patient returned phone call to office about iron deficiency anemia and patient informed that Dr. Irene Limbo recommends IV iron replacement. Patient agreed to IV iron replacement but wants appointments scheduled after her visit with Dr. Irene Limbo on 05/15/19. Scheduling message sent.

## 2019-05-11 ENCOUNTER — Telehealth: Payer: Self-pay | Admitting: Hematology

## 2019-05-11 DIAGNOSIS — Z794 Long term (current) use of insulin: Secondary | ICD-10-CM | POA: Diagnosis not present

## 2019-05-11 DIAGNOSIS — N390 Urinary tract infection, site not specified: Secondary | ICD-10-CM | POA: Diagnosis not present

## 2019-05-11 DIAGNOSIS — Z952 Presence of prosthetic heart valve: Secondary | ICD-10-CM | POA: Diagnosis not present

## 2019-05-11 DIAGNOSIS — I2510 Atherosclerotic heart disease of native coronary artery without angina pectoris: Secondary | ICD-10-CM | POA: Diagnosis not present

## 2019-05-11 DIAGNOSIS — Z6831 Body mass index (BMI) 31.0-31.9, adult: Secondary | ICD-10-CM | POA: Diagnosis not present

## 2019-05-11 DIAGNOSIS — E039 Hypothyroidism, unspecified: Secondary | ICD-10-CM | POA: Diagnosis not present

## 2019-05-11 DIAGNOSIS — Z8673 Personal history of transient ischemic attack (TIA), and cerebral infarction without residual deficits: Secondary | ICD-10-CM | POA: Diagnosis not present

## 2019-05-11 DIAGNOSIS — E1149 Type 2 diabetes mellitus with other diabetic neurological complication: Secondary | ICD-10-CM | POA: Diagnosis not present

## 2019-05-11 DIAGNOSIS — I4891 Unspecified atrial fibrillation: Secondary | ICD-10-CM | POA: Diagnosis not present

## 2019-05-11 DIAGNOSIS — E1165 Type 2 diabetes mellitus with hyperglycemia: Secondary | ICD-10-CM | POA: Diagnosis not present

## 2019-05-11 LAB — HEPATIC FUNCTION PANEL
ALT: 69 — AB (ref 7–35)
AST: 60 — AB (ref 13–35)
Alkaline Phosphatase: 115 (ref 25–125)
Bilirubin, Total: 0.3

## 2019-05-11 LAB — BASIC METABOLIC PANEL
BUN: 18 (ref 4–21)
CO2: 31 — AB (ref 13–22)
Chloride: 99 (ref 99–108)
Creatinine: 1 (ref 0.5–1.1)
Glucose: 204
Potassium: 4.2 (ref 3.4–5.3)
Sodium: 139 (ref 137–147)

## 2019-05-11 LAB — COMPREHENSIVE METABOLIC PANEL: Calcium: 10.3 (ref 8.7–10.7)

## 2019-05-11 LAB — TSH: TSH: 14.81 — AB (ref 0.41–5.90)

## 2019-05-11 NOTE — Telephone Encounter (Signed)
Scheduled per 1/15 sch msg. Called and left msg. Mailing printout

## 2019-05-13 ENCOUNTER — Telehealth: Payer: Self-pay | Admitting: Hematology

## 2019-05-13 NOTE — Telephone Encounter (Signed)
Called pt per 1/19 sch msg - unable to reach pt . Left message for patient to call back and reschedule

## 2019-05-14 ENCOUNTER — Telehealth: Payer: Self-pay | Admitting: *Deleted

## 2019-05-14 ENCOUNTER — Ambulatory Visit: Payer: Medicare Other | Admitting: Internal Medicine

## 2019-05-14 NOTE — Telephone Encounter (Signed)
Late entry for 05/13/2019:Patient called - LVM - needs to speak to someone regarding upcoming appointments for IV iron and to see MD. Contacted patient - no answer- LVM with all upcoming appt times (IV iron and lab/Dr. Irene Limbo appointments). 05/14/2019-Patient contacted office - LVM - not able to keep all appointments, requesting a call. Contacted patient - no answer - LVM to please call Dr. Grier Mitts office (# provided) at her convenience. Patient called office - confirmed appt for phone appt with Dr. Irene Limbo 05/15/2019 at [facility]. Requested [phone] as number to be called. She needs to change appt on 1/29 for IV iron - she has appt at [facility] on that date and change time on appt 2/5. Message sent to scheduling with request to contact patient. Patient verbalized understanding.

## 2019-05-15 ENCOUNTER — Ambulatory Visit: Payer: Medicare Other | Admitting: Internal Medicine

## 2019-05-15 ENCOUNTER — Inpatient Hospital Stay (HOSPITAL_BASED_OUTPATIENT_CLINIC_OR_DEPARTMENT_OTHER): Payer: Medicare Other | Admitting: Hematology

## 2019-05-15 ENCOUNTER — Telehealth: Payer: Self-pay | Admitting: Hematology

## 2019-05-15 DIAGNOSIS — D649 Anemia, unspecified: Secondary | ICD-10-CM | POA: Diagnosis not present

## 2019-05-15 DIAGNOSIS — Z923 Personal history of irradiation: Secondary | ICD-10-CM | POA: Diagnosis not present

## 2019-05-15 DIAGNOSIS — D509 Iron deficiency anemia, unspecified: Secondary | ICD-10-CM | POA: Diagnosis not present

## 2019-05-15 DIAGNOSIS — D563 Thalassemia minor: Secondary | ICD-10-CM | POA: Diagnosis not present

## 2019-05-15 DIAGNOSIS — Z8673 Personal history of transient ischemic attack (TIA), and cerebral infarction without residual deficits: Secondary | ICD-10-CM | POA: Diagnosis not present

## 2019-05-15 DIAGNOSIS — R0602 Shortness of breath: Secondary | ICD-10-CM | POA: Diagnosis not present

## 2019-05-15 DIAGNOSIS — K7460 Unspecified cirrhosis of liver: Secondary | ICD-10-CM | POA: Diagnosis not present

## 2019-05-15 NOTE — Progress Notes (Signed)
HEMATOLOGY/ONCOLOGY CONSULTATION NOTE  Date of Service: 05/15/2019  Patient Care Team: [name], NP as PCP - General (Family Medicine) [name], MD as PCP - Cardiology (Cardiology) Tobi Bastos, RN as [place] Management Madelin Rear, MD as Consulting Physician (Endocrinology)  REFERRING PHYSICIAN: [name], NP  CHIEF COMPLAINTS/PURPOSE OF CONSULTATION:   Thalassemia minor   HISTORY OF PRESENTING ILLNESS:   Breanna Webster is a wonderful 74 y.o. female who has been referred to [name] by [name], NP for evaluation and management of Thalassemia minor. She is accompanied today by her husband. The pt reports that she is doing well overall.   The pt reports On 04/22/2018 she had Cardiac catheterization. The   07/08/2018- Infection around the heart, endocarditis.  Mitral valve infections.  She had a stroke in April 2020.   She gets shortness of breath when walking more than 50 steps. Before April 2020 she was very active. She is also having difficulty with balance.  Doctors are unsure as to why she is having difficulty breathing.  She has had radiation treatment to half of her thyroid.  She has not noticed any improvement since her valve placement  She has never had IV transfusions   She use to take B12 shots in her 79s. She is unsure why they were stopped. Possibly stopped because tests showed she no longer needed B12.   She reports that she was diagnosed with a fatty liver.  She is taking synthroid 125 MCG and eliquis.    Most recent lab results (04/28/2018) of CBC is as follows: all values are WNL except for RBC at 5.44, Hemoglobin at 9.0, Hct at 29.8, MCV at 54.8, RDW at 19.8, Monocytes Relative 2.0, ALT at 39, INR at 1.6, Prothrombin Time at 18.4.  On review of systems, pt reports shortness of breath when walking or being active, fatigue, steady weight, and denies chest pain, leg swelling and any other symptoms.    On PMHx the pt reports liver cirrhosis, diagnosed with Thalassemia minor 20 years ago. Diabetes.   On Social Hx the pt reports never smoked. Never consumed alcohol.   On Family Hx the pt reports son has Thalassemia minor.  INTERVAL HISTORY:  I connected with Breanna Webster on 05/15/19 at  9:40 AM EST by TeleHealth and verified that I am speaking with the correct person using two identifiers.   I discussed the limitations, risks, security and privacy concerns of performing an evaluation and management service by telemedicine and the availability of in-person appointments. I also discussed with the patient that there may be a patient responsible charge related to this service. The patient expressed understanding and agreed to proceed.   Other persons participating in the visit and their role in the encounter: [place]  Patient's location: Home  Provider's location: [place]   Chief Complaint: Thalassemia minor  Breanna Webster is a 74 y.o. female here for evaluation and management of Thalassemia minor. The patient's last visit with [name] was on 04/30/2019. The pt reports that she is doing well overall.  The pt reports her energy levels are down to zero.  Lab results today (04/30/2019) of CBC w/diff and CMP is as follows: all values are WNL except for RBC at 5.46, Hemoglobin at 8.9, HCT at 30.4, MCV at 55.7, MCH at 16.3, MCHC at 29.3, RDW at 19.6, Glucose Bld at 163, AST at 53, ALT at 50 -Folate RBC:  Hematocrit at 31.6 -Lactate  Dehydrogenase: LDH at 194 -TSH: TSH at 5.935 -Iron and TIBC: Iron at 27, TIBC at 485, Saturation  Ratios at 6, UIBC at 458 -Ferritin: Ferritin at 10 -Hemoglobinopathy evaluation: Hgb A2 Quant at 4.1, Hgb A at 95.9 - consistent with beta thal trait.  On review of systems, pt reports worsening fatigue and denies any other symptoms.     MEDICAL HISTORY:  Past Medical History:  Diagnosis Date  . Anxiety   . Arthritis    "back" (04/22/2018)   . Blood transfusion without reported diagnosis   . CAD (coronary artery disease)    a. 03/2018 s/p PCI/DES to the RCA (3.0x15 Onyx DES).  . Carotid artery stenosis    Mild  . Chronic lower back pain   . Cirrhosis ([place])   . Colon polyps   . Diverticulitis   . Diverticulosis   . Esophageal thickening    seen on pre TAVR CT scan, also questionable cirrhosis. MRI recommended. Will refer to GI  . GERD (gastroesophageal reflux disease)   . Grave's disease   . History of colonic polyps 05/22/2017  . History of hiatal hernia   . Hypertension   . Hypothyroidism   . IBS (irritable bowel syndrome)   . Osteopenia   . Pulmonary nodules    seen on pre TAVR CT. likley benign. no follow up recommended if pt low risk.  . S/P TAVR (transcatheter aortic valve replacement)   . Severe aortic stenosis   . Stroke ([place])   . Thalassemia minor   . Type II diabetes mellitus ([place])      SURGICAL HISTORY: Past Surgical History:  Procedure Laterality Date  . [place] STUDY N/A 03/03/2018   Procedure: [place] STUDY;  Surgeon: Mauri Pole, MD;  Location: [facility];  Service: Endoscopy;  Laterality: N/A;  . COLONOSCOPY    . COLONOSCOPY W/ BIOPSIES AND POLYPECTOMY    . CORONARY ANGIOGRAPHY Right 04/21/2018   Procedure: CORONARY ANGIOGRAPHY (CATH LAB);  Surgeon: [name], MD;  Location: [facility];  Service: Cardiovascular;  Laterality: Right;  . CORONARY STENT INTERVENTION N/A 04/22/2018   Procedure: CORONARY STENT INTERVENTION;  Surgeon: [name], MD;  Location: [facility];  Service: Cardiovascular;  Laterality: N/A;  . DILATION AND CURETTAGE OF UTERUS    . ESOPHAGEAL MANOMETRY N/A 03/03/2018   Procedure: ESOPHAGEAL MANOMETRY (EM);  Surgeon: Mauri Pole, MD;  Location: [facility];  Service: Endoscopy;  Laterality: N/A;  . GASTRIC FUNDOPLICATION    . HERNIA REPAIR    . HYSTEROSCOPY     fibroids  . LAPAROSCOPIC CHOLECYSTECTOMY    . LAPAROSCOPY      fibroids  . NISSEN FUNDOPLICATION  [id number]  . POLYPECTOMY    . RIGHT/LEFT HEART CATH AND CORONARY ANGIOGRAPHY N/A 02/20/2018   Procedure: RIGHT/LEFT HEART CATH AND CORONARY ANGIOGRAPHY;  Surgeon: [name], MD;  Location: [facility];  Service: Cardiovascular;  Laterality: N/A;  . TEE WITHOUT CARDIOVERSION N/A 07/08/2018   Procedure: TRANSESOPHAGEAL ECHOCARDIOGRAM (TEE);  Surgeon: Burnell Blanks, MD;  Location: [facility];  Service: Open Heart Surgery;  Laterality: N/A;  . TEE WITHOUT CARDIOVERSION  10/07/2018  . TEE WITHOUT CARDIOVERSION N/A 10/07/2018   Procedure: TRANSESOPHAGEAL ECHOCARDIOGRAM (TEE);  Surgeon: Jerline Pain, MD;  Location: [facility] ENDOSCOPY;  Service: Cardiovascular;  Laterality: N/A;  . TONSILLECTOMY    . TRANSCATHETER AORTIC VALVE REPLACEMENT, TRANSFEMORAL N/A 07/08/2018   Procedure: TRANSCATHETER AORTIC VALVE REPLACEMENT, TRANSFEMORAL;  Surgeon:  Burnell Blanks, MD;  Location: [facility];  Service: Open Heart Surgery;  Laterality: N/A;     SOCIAL HISTORY: Social History   Socioeconomic History  . Marital status: Married    Spouse name: Not on file  . Number of children: 2  . Years of education: Not on file  . Highest education level: Not on file  Occupational History  . Occupation: Tree surgeon of the [employer]  Tobacco Use  . Smoking status: Never Smoker  . Smokeless tobacco: Never Used  Substance and Sexual Activity  . Alcohol use: No    Alcohol/week: 0.0 standard drinks  . Drug use: Never  . Sexual activity: Not Currently  Other Topics Concern  . Not on file  Social History Narrative   Lives with husband in a one story home.     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   Education: college.   Social Determinants of Health   Financial Resource Strain: Low Risk   . Difficulty of Paying Living Expenses: Not hard at all  Food Insecurity:   . Worried About  Charity fundraiser in the Last Year: Not on file  . Ran Out of Food in the Last Year: Not on file  Transportation Needs: No Transportation Needs  . Lack of Transportation (Medical): No  . Lack of Transportation (Non-Medical): No  Physical Activity:   . Days of Exercise per Week: Not on file  . Minutes of Exercise per Session: Not on file  Stress: Stress Concern Present  . Feeling of Stress : To some extent  Social Connections:   . Frequency of Communication with Friends and Family: Not on file  . Frequency of Social Gatherings with Friends and Family: Not on file  . Attends Religious Services: Not on file  . Active Member of Clubs or Organizations: Not on file  . Attends Archivist Meetings: Not on file  . Marital Status: Not on file  Intimate Partner Violence:   . Fear of Current or Ex-Partner: Not on file  . Emotionally Abused: Not on file  . Physically Abused: Not on file  . Sexually Abused: Not on file     FAMILY HISTORY: Family History  Adopted: Yes  Problem Relation Age of Onset  . Healthy Son        x 2  . Headache Other        Cluster headaches  . Heart failure Mother   . Colon cancer Neg Hx   . Pancreatic cancer Neg Hx   . Rectal cancer Neg Hx   . Stomach cancer Neg Hx    ALLERGIES:   is allergic to statins.   MEDICATIONS:  Current Outpatient Medications  Medication Sig Dispense Refill  . amitriptyline (ELAVIL) 25 MG tablet TAKE 1 TABLET(25 MG) BY MOUTH AT BEDTIME 90 tablet 1  . apixaban (ELIQUIS) 5 MG TABS tablet Take 1 tablet (5 mg total) by mouth 2 (two) times daily. (Patient taking differently: Take 5 mg by mouth 2 (two) times daily. Pt is currently taking this medication) 180 tablet 1  . aspirin 81 MG tablet Take 81 mg by mouth daily.      . BD INSULIN SYRINGE U/F 31G X 5/16" 1 ML MISC USE TO INJECT INSULIN EVERY DAY 100 each 3  . cholecalciferol (VITAMIN D) 1000 units tablet Take 1,000 Units by mouth daily.    Marland Kitchen diltiazem (CARDIZEM CD) 120  MG 24 hr capsule TAKE 1 CAPSULE(120 MG) BY MOUTH DAILY 90 capsule 1  . escitalopram (LEXAPRO) 20 MG tablet TAKE 1 TABLET BY MOUTH EVERY DAY 90 tablet 1  . hydrochlorothiazide (HYDRODIURIL) 12.5 MG tablet Take 1 tablet (12.5 mg total) by mouth daily. 90 tablet 1  . insulin NPH Human (HUMULIN N) 100 UNIT/ML injection Inject 1 mL (100 Units total) into the skin daily before breakfast. 30 mL 0  . Lancets (ONETOUCH DELICA PLUS [id number]) MISC USE TO CHECK BLOOD SUGAR TWICE DAILY.. 200 each 0  . losartan (COZAAR) 50 MG tablet TAKE 1 TABLET(50 MG) BY MOUTH DAILY 90 tablet 1  . Melatonin 5 MG TABS Take 5 mg by mouth at bedtime as needed.     . metFORMIN (GLUCOPHAGE) 500 MG tablet Take 500 mg by mouth 2 (two) times daily with a meal.     . Multiple Vitamin (MULITIVITAMIN WITH MINERALS) TABS Take 1 tablet by mouth daily.    . nitroGLYCERIN (NITROSTAT) 0.4 MG SL tablet Place 1 tablet (0.4 mg total) under the tongue every 5 (five) minutes as needed for chest pain. 25 tablet 3  . NOVOLIN R 100 UNIT/ML injection INJECT 15 UNITS THREE TIMES DAILY BEFORE MEALS    . ONETOUCH VERIO test strip USE TO MONITOR GLUCOSE LEVELS TWICE DAILY 200 strip 3  . pantoprazole (PROTONIX) 40 MG tablet Take 1 tablet (40 mg total) by mouth daily. 90 tablet 3  . SYNTHROID 125 MCG tablet Take 125 mcg by mouth every morning.     No current facility-administered medications for this visit.     REVIEW OF SYSTEMS:   A 10+ POINT REVIEW OF SYSTEMS WAS OBTAINED including neurology, dermatology, psychiatry, cardiac, respiratory, lymph, extremities, GI, GU, Musculoskeletal, constitutional, breasts, reproductive, HEENT.  All pertinent positives are noted in the HPI.  All others are negative.      PHYSICAL EXAMINATION: There were no vitals filed for this visit. Wt Readings from Last 3 Encounters:  04/30/19 193 lb 9.6 oz (87.8 kg)  04/29/19 192 lb 9.6 oz (87.4 kg)  04/01/19 195 lb 12.8 oz (88.8 kg)   There is no height or weight on  file to calculate BMI.    Telehealth Visit 05/15/19    LABORATORY DATA:  I have reviewed the data as listed  CBC Latest Ref Rng & Units 04/30/2019 04/30/2019 04/29/2019  WBC 4.0 - 10.5 K/uL 7.0 - 7.2  Hemoglobin 12.0 - 15.0 g/dL 8.9(L) - 9.0 Repeated and verified X2.(L)  Hematocrit 34.0 - 46.6 % 31.6(L) 30.4(L) 29.8(L)  Platelets 150 - 400 K/uL 208 - 189.0   . CBC    Component Value Date/Time   WBC 7.0 04/30/2019 1148   RBC 5.46 (H) 04/30/2019 1148   HGB 8.9 (L) 04/30/2019 1148   HGB 11.0 (L) 11/12/2018 0939   HCT 31.6 (L) 04/30/2019 1149   HCT 30.4 (L) 04/30/2019 1148   PLT 208 04/30/2019 1148   PLT 238 11/12/2018 0939   MCV 55.7 (L) 04/30/2019 1148   MCV 65 (L) 11/12/2018 0939   MCH 16.3 (L) 04/30/2019 1148   MCHC 29.3 (L) 04/30/2019 1148   RDW 19.6 (H) 04/30/2019 1148   RDW 18.2 (H) 11/12/2018 0939   LYMPHSABS 1.5 04/30/2019 1148   LYMPHSABS 1.2 02/26/2017 1054   MONOABS 0.5 04/30/2019 1148   EOSABS 0.2 04/30/2019 1148   EOSABS 0.1 02/26/2017 1054   BASOSABS 0.0 04/30/2019 1148   BASOSABS 0.0 02/26/2017 1054   . CMP Latest Ref Rng &  Units 04/30/2019 04/29/2019 12/01/2018  Glucose 70 - 99 mg/dL 163(H) - -  BUN 8 - 23 mg/dL 22 - 18  Creatinine 0.44 - 1.00 mg/dL 0.90 - 1.0  Sodium 135 - 145 mmol/L 139 - 139  Potassium 3.5 - 5.1 mmol/L 4.0 - 4.2  Chloride 98 - 111 mmol/L 103 - -  CO2 22 - 32 mmol/L 24 - -  Calcium 8.9 - 10.3 mg/dL 10.2 - -  Total Protein 6.5 - 8.1 g/dL 7.7 7.4 -  Total Bilirubin 0.3 - 1.2 mg/dL 0.5 0.5 -  Alkaline Phos 38 - 126 U/L 94 81 115  AST 15 - 41 U/L 53(H) 36 60(A)  ALT 0 - 44 U/L 50(H) 39(H) 69(A)   . Lab Results  Component Value Date   IRON 27 (L) 04/30/2019   TIBC 485 (H) 04/30/2019   IRONPCTSAT 6 (L) 04/30/2019   (Iron and TIBC)  Lab Results  Component Value Date   FERRITIN 10 (L) 04/30/2019   Component     Latest Ref Rng & Units 04/30/2019  IgG (Immunoglobin G), Serum     586 - 1,602 mg/dL 1,299  IgA     64 - 422 mg/dL 342    IgM (Immunoglobulin M), Srm     26 - 217 mg/dL 108  Total Protein ELP     6.0 - 8.5 g/dL 7.0  Albumin SerPl Elph-Mcnc     2.9 - 4.4 g/dL 3.5  Alpha 1     0.0 - 0.4 g/dL 0.2  Alpha2 Glob SerPl Elph-Mcnc     0.4 - 1.0 g/dL 0.7  B-Globulin SerPl Elph-Mcnc     0.7 - 1.3 g/dL 1.3  Gamma Glob SerPl Elph-Mcnc     0.4 - 1.8 g/dL 1.3  M Protein SerPl Elph-Mcnc     Not Observed g/dL Not Observed  Globulin, Total     2.2 - 3.9 g/dL 3.5  Albumin/Glob SerPl     0.7 - 1.7 1.1  IFE 1      Comment  Please Note (HCV):      Comment  Hgb A2 Quant     1.8 - 3.2 % 4.1 (H)  Hgb F Quant     0.0 - 2.0 % 0.0  Hgb S Quant     0.0 % 0.0  HGB C     0.0 % 0.0  Hgb A     96.4 - 98.8 % 95.9 (L)  HGB VARIANT     0.0 % 0.0  Please Note:      Comment  Iron     41 - 142 ug/dL 27 (L)  TIBC     236 - 444 ug/dL 485 (H)  Saturation Ratios     21 - 57 % 6 (L)  UIBC     120 - 384 ug/dL 458 (H)  Folate, Hemolysate     Not Estab. ng/mL 463.0  HCT     34.0 - 46.6 % 31.6 (L)  Folate, RBC     >498 ng/mL 1,465  Haptoglobin     42 - 346 mg/dL 65  LDH     98 - 192 U/L 194 (H)  T4,Free(Direct)     0.61 - 1.12 ng/dL 0.89  TSH     0.308 - 3.960 uIU/mL 5.935 (H)  Vitamin B12     180 - 914 pg/mL 575  Ferritin     11 - 307 ng/mL 10 (L)    RADIOGRAPHIC STUDIES: I have personally reviewed the  radiological images as listed and agreed with the findings in the report. No results found.   ASSESSMENT & PLAN:   Breanna Webster is a 74 y.o. female with:  1. Moderate Microcytic Anemia 2. H/o Beta thalassemia minor with baseline hgb likely in the 10-11 range 3. Iron deficiency   PLAN: -Discussed pt labwork today, 04/30/2019;  all values are WNL except for RBC at 5.46, Hemoglobin at 8.9, HCT at 30.4, MCV at 55.7, MCH at 16.3, MCHC at 29.3, RDW at 19.6, Glucose Bld at 163, AST at 53, ALT at 50 -Folate RBC:  Hematocrit at 31.6 -Lactate Dehydrogenase: LDH at 194 -TSH: TSH at 5.935 -Iron and TIBC:  Iron at 27, TIBC at 485, Saturation  Ratios at 6, UIBC at 458 -Ferritin: Ferritin at 10 -Hemoglobinopathy evaluation: Hgb A2 Quant at 4.1, Hgb A at 95.9 -Advised that taking iron will having Thalassemia minor will not cause any issues.  -Discussed maintaining thyroid function to help optimizing energy levels -No signs of hemolysis -No signs of a bone marrow disorder -Advised that IV iron will help correct anemia and return her to her baseline -Will repeat labs and review in two months to see if it has improved fatigue and iron levels. -Discussed that she is scheduled for IV Iron on 05/21/19 and 06/01/19 -Discussed with her the risks of iv iron such as allergic reactions and advised that she will be monitored and given medication if necessary -She will receive her COVID-19 vaccination on Saturday.   FOLLOW UP: RTC with Dr Irene Limbo with labs in 3 months  The total time spent in the appt was 25 minutes and more than 50% was on counseling and direct patient cares.  All of the patient's questions were answered with apparent satisfaction. The patient knows to call the clinic with any problems, questions or concerns.     Sullivan Lone MD MS AAHIVMS [facility] Hematology/Oncology Physician [facility]  (Office):       [phone] (Work cell):  [phone] (Fax):           [phone]  05/15/2019 1:08 AM I, Scot Dock, am acting as a scribe for Dr. Sullivan Lone.   .I have reviewed the above documentation for accuracy and completeness, and I agree with the above. [name] MD

## 2019-05-15 NOTE — Telephone Encounter (Signed)
Scheduled per 1/21 sch msg. Called and spoke with pt, confirmed 1/28 and 2/8 appt

## 2019-05-16 ENCOUNTER — Ambulatory Visit: Payer: Medicare Other | Attending: Internal Medicine

## 2019-05-16 DIAGNOSIS — Z23 Encounter for immunization: Secondary | ICD-10-CM | POA: Insufficient documentation

## 2019-05-16 NOTE — Progress Notes (Signed)
   Covid-19 Vaccination Clinic  Name:  MANEH SIEBEN    MRN: [id number] DOB: February 08, 1946  05/16/2019  Ms. Tallon was observed post Covid-19 immunization for 15 minutes without incidence. She was provided with Vaccine Information Sheet and instruction to access the V-Safe system.   Ms. Sopher was instructed to call 911 with any severe reactions post vaccine: Marland Kitchen Difficulty breathing  . Swelling of your face and throat  . A fast heartbeat  . A bad rash all over your body  . Dizziness and weakness    Immunizations Administered    Name Date Dose VIS Date Route   Pfizer COVID-19 Vaccine 05/16/2019  2:38 PM 0.3 mL 04/03/2019 Intramuscular   Manufacturer: [place]   Lot: [id number]   [place]: [address]-8

## 2019-05-21 ENCOUNTER — Inpatient Hospital Stay: Payer: Medicare Other

## 2019-05-21 ENCOUNTER — Other Ambulatory Visit: Payer: Self-pay

## 2019-05-21 VITALS — BP 123/55 | HR 81 | Temp 97.3°F | Resp 18

## 2019-05-21 DIAGNOSIS — D509 Iron deficiency anemia, unspecified: Secondary | ICD-10-CM

## 2019-05-21 DIAGNOSIS — D563 Thalassemia minor: Secondary | ICD-10-CM | POA: Diagnosis not present

## 2019-05-21 DIAGNOSIS — Z923 Personal history of irradiation: Secondary | ICD-10-CM | POA: Diagnosis not present

## 2019-05-21 DIAGNOSIS — K7460 Unspecified cirrhosis of liver: Secondary | ICD-10-CM | POA: Diagnosis not present

## 2019-05-21 DIAGNOSIS — R0602 Shortness of breath: Secondary | ICD-10-CM | POA: Diagnosis not present

## 2019-05-21 DIAGNOSIS — Z8673 Personal history of transient ischemic attack (TIA), and cerebral infarction without residual deficits: Secondary | ICD-10-CM | POA: Diagnosis not present

## 2019-05-21 MED ORDER — SODIUM CHLORIDE 0.9 % IV SOLN
750.0000 mg | Freq: Once | INTRAVENOUS | Status: AC
  Administered 2019-05-21: 750 mg via INTRAVENOUS
  Filled 2019-05-21: qty 15

## 2019-05-21 MED ORDER — SODIUM CHLORIDE 0.9 % IV SOLN
Freq: Once | INTRAVENOUS | Status: AC
  Filled 2019-05-21: qty 250

## 2019-05-21 NOTE — Patient Instructions (Signed)
Ferric carboxymaltose injection °What is this medicine? °FERRIC CARBOXYMALTOSE (ferr-ik car-box-ee-mol-toes) is an iron complex. Iron is used to make healthy red blood cells, which carry oxygen and nutrients throughout the body. This medicine is used to treat anemia in people with chronic kidney disease or people who cannot take iron by mouth. °This medicine may be used for other purposes; ask your health care provider or pharmacist if you have questions. °COMMON BRAND NAME(S): Injectafer °What should I tell my health care provider before I take this medicine? °They need to know if you have any of these conditions: °· high levels of iron in the blood °· liver disease °· an unusual or allergic reaction to iron, other medicines, foods, dyes, or preservatives °· pregnant or trying to get pregnant °· breast-feeding °How should I use this medicine? °This medicine is for infusion into a vein. It is given by a health care professional in a hospital or clinic setting. °Talk to your pediatrician regarding the use of this medicine in children. Special care may be needed. °Overdosage: If you think you have taken too much of this medicine contact a poison control center or emergency room at once. °NOTE: This medicine is only for you. Do not share this medicine with others. °What if I miss a dose? °It is important not to miss your dose. Call your doctor or health care professional if you are unable to keep an appointment. °What may interact with this medicine? °Do not take this medicine with any of the following medications: °· deferoxamine °· dimercaprol °· other iron products °This list may not describe all possible interactions. Give your health care provider a list of all the medicines, herbs, non-prescription drugs, or dietary supplements you use. Also tell them if you smoke, drink alcohol, or use illegal drugs. Some items may interact with your medicine. °What should I watch for while using this medicine? °Visit your  doctor or health care professional regularly. Tell your doctor if your symptoms do not start to get better or if they get worse. You may need blood work done while you are taking this medicine. °You may need to follow a special diet. Talk to your doctor. Foods that contain iron include: whole grains/cereals, dried fruits, beans, or peas, leafy green vegetables, and organ meats (liver, kidney). °What side effects may I notice from receiving this medicine? °Side effects that you should report to your doctor or health care professional as soon as possible: °· allergic reactions like skin rash, itching or hives, swelling of the face, lips, or tongue °· dizziness °· facial flushing °Side effects that usually do not require medical attention (report to your doctor or health care professional if they continue or are bothersome): °· changes in taste °· constipation °· headache °· nausea, vomiting °· pain, redness, or irritation at site where injected °This list may not describe all possible side effects. Call your doctor for medical advice about side effects. You may report side effects to FDA at 1-800-FDA-1088. °Where should I keep my medicine? °This drug is given in a hospital or clinic and will not be stored at home. °NOTE: This sheet is a summary. It may not cover all possible information. If you have questions about this medicine, talk to your doctor, pharmacist, or health care provider. °© 2020 Elsevier/Gold Standard (2016-05-24 09:40:29) ° °

## 2019-05-22 ENCOUNTER — Ambulatory Visit: Payer: Medicare Other

## 2019-05-22 DIAGNOSIS — Z952 Presence of prosthetic heart valve: Secondary | ICD-10-CM | POA: Diagnosis not present

## 2019-05-22 DIAGNOSIS — I422 Other hypertrophic cardiomyopathy: Secondary | ICD-10-CM | POA: Diagnosis not present

## 2019-05-22 DIAGNOSIS — I480 Paroxysmal atrial fibrillation: Secondary | ICD-10-CM | POA: Insufficient documentation

## 2019-05-22 DIAGNOSIS — K7469 Other cirrhosis of liver: Secondary | ICD-10-CM | POA: Diagnosis not present

## 2019-05-22 DIAGNOSIS — I10 Essential (primary) hypertension: Secondary | ICD-10-CM | POA: Diagnosis not present

## 2019-05-22 DIAGNOSIS — I059 Rheumatic mitral valve disease, unspecified: Secondary | ICD-10-CM | POA: Diagnosis not present

## 2019-05-22 DIAGNOSIS — I2510 Atherosclerotic heart disease of native coronary artery without angina pectoris: Secondary | ICD-10-CM | POA: Diagnosis not present

## 2019-05-26 ENCOUNTER — Other Ambulatory Visit: Payer: Self-pay

## 2019-05-26 ENCOUNTER — Ambulatory Visit (INDEPENDENT_AMBULATORY_CARE_PROVIDER_SITE_OTHER): Payer: Medicare Other | Admitting: Internal Medicine

## 2019-05-26 ENCOUNTER — Encounter: Payer: Self-pay | Admitting: Internal Medicine

## 2019-05-26 DIAGNOSIS — R0609 Other forms of dyspnea: Secondary | ICD-10-CM

## 2019-05-26 DIAGNOSIS — R0600 Dyspnea, unspecified: Secondary | ICD-10-CM | POA: Diagnosis not present

## 2019-05-26 DIAGNOSIS — K589 Irritable bowel syndrome without diarrhea: Secondary | ICD-10-CM

## 2019-05-26 NOTE — Progress Notes (Signed)
Subjective:     Patient ID: Breanna Webster, female   DOB: [birth year]     MRN: [id number]    Brief patient profile:  10  yowf never smoker with pnds as child allergy tested as child doesn't know details, never took shots,  outgrew completely then symptoms resumed in 90's after moved to [place] from [facility] with coughing that improved p NF late 90s  but still had drainage variably  severe no pattern as to time of year then much worse since summer 2016 with onset of cough/wheeze May 2017 referred to pulmonary clinic 10/14/2015 by Dr Betty [name].      History of Present Illness  10/14/2015 [facility] visit/ Lecil Tapp   Chief Complaint  Patient presents with  . Pulmonary Consult    Referred by Dr. Betty [name]. Pt c/o cough and wheezing x 5 wks. Cough is occ prod with minimal green sputum.   acute onset cough > green mucus one week p husband's uri May 2017 >  rx with abx/ prednisone improved then worse 2 days p finished but mostly productive  white mucus.  If doesn't take the codeine cough med then coughs worse at hs.  No assoc sob/ some wheeze better with saba  rec The key to effective treatment for your cough is eliminating the non-stop cycle of cough you're stuck in long enough to let your airway heal completely and then see if there is anything still making you cough once you stop the cough suppression, but this should take no more than 5 days to figure out Eliminate all coughing for 3 days with your codiene syrup and then switch delsym 2 tsp every 12 hours  Prednisone 10 mg take  4 each am x 2 days,   2 each am x 2 days,  1 each am x 2 days and stop (this is to eliminate allergies and inflammation from coughing) Protonix (pantoprazole) Take 30-60 min before first meal of the day and Pepcid 20 mg one bedtime plus chlorpheniramine 4 mg x 2 at bedtime (both available over the counter)  until cough is completely gone for at least a week without the need for cough suppression GERD  diet    11/02/2015 acute extended ov/Breanna Webster re: cough since may 2017   Chief Complaint  Patient presents with  . Pulmonary Consult    Cough has only improved some. She is still wheezing and her cough is prod with green sputum.    worse in am and at bedtime and slt green mucus again p initially turning white  rec Please see patient coordinator before you leave today  to schedule sinus CT and we will call results  For drainage / throat tickle try take CHLORPHENIRAMINE  4 mg - take one every 4 hours as needed -  Please remember to go to the lab  department downstairs for your tests - we will call you with the results when they are available. Until cough gone 100% with no need for any cough medicine > prilosec or pantoprazole 40 mg proton pump inhibitor and continue pepcid 20 mg at hs Take delsym two tsp every 12 hours and supplement if needed with tylenol #3  up to 2 every 4 hours to suppress the urge to cough.  Once you have eliminated the cough for 3 straight days try reducing the tylenol #3  first,  then the delsym as tolerated.   Stop toprol (lopressor/metaprolol)  And start bisoprolol 5 mg one daily  12/13/2015  f/u ov/Breanna Webster re: cyclical cough/ gerd Chief Complaint  Patient presents with  . Follow-up    Cough has resolved completely and no more wheezing for the past month. No new co's.    no cough at all/ on no gerd meds/ on bisoprolol instead of metaprolol and doing great  rec Remember at the onset of any respiratory complaint :  Try prilosec otc 79m  Take 30-60 min before first meal of the day and Pepcid ac (famotidine) 20 mg one @  bedtime until completely better for a week For drainage / throat tickle try take CHLORPHENIRAMINE  4 mg - take one every 4 hours as needed -          07/19/2016 acute extended ov/Breanna Webster re: sob no longer on gerd rx "I don't have any HB"  Chief Complaint  Patient presents with  . Acute Visit    Increased SOB x 3 months. She states that she gets  SOB with any exertion at all.    new problem =  Sob with exertion lifelong always the last one off the playground and trouble with steps and hills as long as she can recall Baseline = walking up to 45 min flat at [employer] [facility] s stopping  and 30 min on bicycle at "moderate pace and resistance"  Took some time off due to back pain and now can't walk 5 min with back pain and sob with back pain so severe today in w/c and pain shooting down R leg to foot s/p failed injections so far Never on inhalers  rec Nexium 40 (or prilosec 20 x 2) Take 30- 60 min before your first and last meals of the day  GERD  Diet     03/20/17 EGD [name]:  Loose NF, Barrett's    11/11/2017  Extended f/u ov/Breanna Webster re: doe/ recurrent cough on gerd rx  Chief Complaint  Patient presents with  . Follow-up    Pt states she still has the cough. Pt states she will have spasms that will bring on her cough. Pt also has complaints of SOB with exertion and has chest tightness.  Dyspnea:  MMRC3 = can't walk 100 yards even at a slow pace at a flat grade s stopping due to sob  - only able to shop by pushing cart around "anywhere" but with freq stops  Cough: dry sporadic daytime / eating and talking seem to trigger it mostly  rec  To get the most out of exercise, you need to be continuously aware that you are short of breath, but never out of breath, for 30 minutes daily. As you improve, it will actually be easier for you to do the same amount of exercise  in  30 minutes so always push to the level where you are short of breath.    We will arrange a cpst in one month no sooner and I will call you with the results needs repeat echo prior to  CPST >  Pos AS     W/u for AS  02/20/18 LHC/R[facility]  Mean gradient  18 with wedge 18  and mod PH wih PVR 294 > ONO RA ok/ v/q nl     03/13/2018  Extended  f/u ov/Breanna Webster re: doe/ chronic cough? ? PRoebling  Chief Complaint  Patient presents with  . Follow-up    Cough still present and  getting worse - productive clear most of the time.   Dyspnea:  One half block then  has to stop due to cough and sob  Cough: daytime / variable during the day,  Up to a tbsp clear/ gag but no vomit Sleeping: flat with 2 pillows  SABA use: none 02: no  Records show best response was to cyclical cough regimen which has never been repeated since 11/02/15  rec If you are asked to stop the aspirin for any reason check with Dr Tamala Julian There is no pulmonary reason you cannot have dental surgery at this point For drainage / throat tickle try take CHLORPHENIRAMINE  4 mg (chlortabs 4 mg from [facility]) - take one every 4 hours as needed - available over the counter- may cause drowsiness so start with just a bedtime dose or two and see how you tolerate it before trying in daytime. GERD (REFLUX)   Please schedule a follow up office visit in 6 weeks, call sooner if needed with all medications /inhalers/ solutions in hand so we can verify exactly what you are taking. This includes all medications from all doctors and over the counters  - add full pfts on return - move up to 2 weeks and add tyl #3 in meantime if still can't stop coughing   04/08/2018  f/u ov/Breanna Webster re:  Doe  Chief Complaint  Patient presents with  . Follow-up    PFT done today. Her breathing has improved and she is coughing less but is not back to her normal baseline.   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 The left ventricle has normal systolic function, with an ejection fraction of 60-65%. The cavity size was normal. There is moderately increased left ventricular wall thickness. Left ventricular diastolic  Doppler parameters are consistent with  impaired relaxation. Elevated left ventricular end-diastolic pressure. Nl LA GI diastolic dysfunction   2. The right ventricle has normal systolc function. The cavity was normal. There is no increase in right ventricular wall thickness. Right ventricular systolic pressure could not be assessed.  3. The mitral valve is degenerative. Mild thickening of the mitral valve leaflet. There is severe mitral annular calcification present. Mild mitral valve stenosis.  4. Pulmonic valve regurgitation was not assessed by color flow Doppler.  5. - TAVR: S/P TAVR that appears to be functioning normally. There is no perivavluarl AR. The mean AVG is normal at 99mHg. LVOT/AV VTI ratio:0.54. AV Vmax:279.00 cm/s.    02/11/2019  extended f/u ov/Breanna Webster re: doe "I'm no better since surgery"  - did not bring meds, has high tsh not being followed in epic and balance issues since CVA Chief Complaint  Patient presents with  . Follow-up    Pt states she had a stroke in May 2020 and she gets SOB doing PT.   Dyspnea:  Walking x 15 min slow pace (not prior to surgery could walk "maybe a block") Cough: gone completely  Sleeping: 6 in bedblock no resp symptoms but insomnia p elavil and one h1  SABA use: none 02: none  rec Ok to try chlorpheniramine 4 mg x 2 at bedtime  Monitor your 02 level at peak exercise by  pulse oximetry when available  Please remember to go to the  x-ray department  for your tests - we will call you with the results when they are available   Please schedule a follow up visit in 3 months but call sooner if needed  with all medications /inhalers/ solutions in hand so we can verify exactly what you are taking. This includes all medications from all doctors and over the counters   [facility] eval 05/22/19  : her dyspnea is due to severe impairment of diastolic inflow into the left heart in the setting of severe LVH and calcific mitral valve disease. Her cirrhosis and anemia  causing high output are also contributing to her symptoms of shortness of breath. Optimizing her medical regimen would be beneficial  05/26/2019  f/u ov/Breanna Webster re: doe  Chief Complaint  Patient presents with  . Follow-up    Breathing has been progressively worse since the last visit here.   Dyspnea:  Anything more than slow paced activity makes her sob ? Becoming "cardiac criple"  Cough: none Sleeping:    6 in bed blocks  SABA use: none  02: none  New intermittent discomfort radiates L to R and never supine across upper abd no pleuritic or ex component - did not mention this to cards 05/22/19    No obvious day to day or daytime variability or assoc excess/ purulent sputum or mucus plugs or hemoptysis or cp or chest tightness, subjective wheeze or overt sinus or hb symptoms.   Sleeping as above  without nocturnal  or early am exacerbation  of respiratory  c/o's or need for noct saba. Also denies any obvious fluctuation of symptoms with weather or environmental changes or other aggravating or alleviating factors except as outlined above   No unusual exposure hx or h/o childhood pna/ asthma or knowledge of premature birth.  Current Allergies, Complete Past Medical History, Past Surgical History, Family History, and Social History were reviewed in [employer] record.  ROS  The following are not active complaints unless bolded Hoarseness, sore throat, dysphagia, dental problems, itching, sneezing,  nasal congestion or discharge of excess mucus or purulent secretions, ear ache,   fever, chills, sweats, unintended wt loss or wt gain, classically pleuritic or exertional cp,  orthopnea pnd or arm/hand swelling  or leg swelling, presyncope, palpitations, abdominal pain, anorexia, nausea, vomiting, diarrhea  or change in bowel habits or change in bladder habits, change in stools or change in urine, dysuria, hematuria,  rash, arthralgias, visual complaints, headache, numbness, weakness  or ataxia or problems with walking or coordination,  change in mood or  memory.        Current Meds  Medication Sig  . amitriptyline (ELAVIL) 25 MG tablet TAKE 1 TABLET(25 MG) BY MOUTH AT BEDTIME  . apixaban (ELIQUIS) 5 MG TABS tablet Take 1 tablet (5 mg total) by mouth 2 (two) times daily. (Patient taking differently: Take 5 mg by mouth 2 (two) times daily. Pt is currently taking this medication)  . aspirin 81 MG tablet Take 81 mg by mouth daily.    . BD INSULIN SYRINGE U/F 31G X 5/16" 1 ML MISC USE TO INJECT INSULIN EVERY DAY  . cholecalciferol (VITAMIN D) 1000 units tablet Take 1,000 Units by mouth daily.  Marland Kitchen diltiazem (CARDIZEM CD) 120 MG 24 hr capsule TAKE 1 CAPSULE(120 MG) BY MOUTH DAILY  . escitalopram (LEXAPRO) 20 MG tablet TAKE 1 TABLET BY MOUTH EVERY DAY  . hydrochlorothiazide (HYDRODIURIL) 12.5 MG tablet  Take 1 tablet (12.5 mg total) by mouth daily.  . insulin NPH Human (HUMULIN N) 100 UNIT/ML injection Inject 1 mL (100 Units total) into the skin daily before breakfast.  . Lancets (ONETOUCH DELICA PLUS [id number]) MISC USE TO CHECK BLOOD SUGAR TWICE DAILY..  . losartan (COZAAR) 50 MG tablet TAKE 1 TABLET(50 MG) BY MOUTH DAILY  . Melatonin 5 MG TABS Take 5 mg by mouth at bedtime as needed.   . metFORMIN (GLUCOPHAGE) 500 MG tablet Take 500 mg by mouth 2 (two) times daily with a meal.   . Multiple Vitamin (MULITIVITAMIN WITH MINERALS) TABS Take 1 tablet by mouth daily.  . nitroGLYCERIN (NITROSTAT) 0.4 MG SL tablet Place 1 tablet (0.4 mg total) under the tongue every 5 (five) minutes as needed for chest pain.  Marland Kitchen NOVOLIN R 100 UNIT/ML injection INJECT 15 UNITS THREE TIMES DAILY BEFORE MEALS  . ONETOUCH VERIO test strip USE TO MONITOR GLUCOSE LEVELS TWICE DAILY  . pantoprazole (PROTONIX) 40 MG tablet Take 1 tablet (40 mg total) by mouth daily.  Marland Kitchen SYNTHROID 125 MCG tablet Take 125 mcg by mouth every morning.                         Objective:   Physical Exam   amb obese female    05/26/2019          192  02/11/2019      192  04/08/2018      204  03/13/2018      202  11/11/2017        204  07/19/2016        196 per pt  12/13/2015       193   11/02/2015       193  10/14/15 192 lb (87.091 kg)  10/10/15 190 lb (86.183 kg)  10/07/15 189 lb 4 oz (85.843 kg)    Vital signs reviewed  05/26/2019  - Note at rest 02 sats  99% on RA      HEENT : pt wearing mask not removed for exam due to covid -19 concerns.    NECK :  without JVD/Nodes/TM/ nl carotid upstrokes bilaterally   LUNGS: no acc muscle use,  Nl contour chest which is clear to A and P bilaterally without cough on insp or exp maneuvers   CV:  RRR  no s3  1/VI sys/diast murmur   or increase in P2, and no edema   ABD:  Mod tensely obese/ nontender with nl inspiratory excursion in the supine position. No bruits or organomegaly appreciated, bowel sounds nl  MS:  Nl gait/ ext warm without deformities, calf tenderness, cyanosis or clubbing No obvious joint restrictions   SKIN: warm and dry without lesions    NEURO:  alert, approp, nl sensorium with  no motor or cerebellar deficits apparent.    U/s abd 03/16/2019 : Focal post cholecystectomy. Heterogeneous, echogenic and slightly nodular liver most likely representing cirrhosis.          Lab Results  Component Value Date   HGB 8.9 (L) 04/30/2019   HGB 9.0 Repeated and verified X2. (L) 04/29/2019   HGB 10.4 (L) 11/17/2018   HGB 11.0 (L) 11/12/2018   HGB 10.7 (L) 04/14/2018   HGB 11.5 02/19/2018                   Assessment:

## 2019-05-26 NOTE — Patient Instructions (Signed)
Classic subdiaphragmatic pain pattern suggests ibs:  Stereotypical, migratory with a very limited distribution of pain locations, daytime, not usually exacerbated by exercise  or coughing, worse in sitting position, frequently associated with generalized abd bloating, not as likely to be present supine due to the dome effect of the diaphragm which  is  canceled in that position. Frequently these patients have had multiple negative GI workups and CT scans.  Treatment consists of avoiding foods that cause gas (especially boiled eggs, mexcican food but especially  beans and undercooked vegetables like  spinach and some salads)  and citrucel 1 heaping tsp twice daily with a large glass of water.  Pain should improve w/in 2 weeks and if not then consider further GI work up.    If you are satisfied with your treatment plan,  let your doctor know and he/she can either refill your medications or you can return here when your prescription runs out.     If in any way you are not 100% satisfied,  please tell us.  If 100% better, tell your friends!  Pulmonary follow up is as needed

## 2019-05-27 ENCOUNTER — Encounter: Payer: Self-pay | Admitting: Internal Medicine

## 2019-05-27 ENCOUNTER — Encounter: Payer: Self-pay | Admitting: Adult Health

## 2019-05-27 NOTE — Assessment & Plan Note (Signed)
Recurrent, flared around 1st of 2021 > IBS diet/ citrucel rec 05/26/2019 with f/u by GI planned    Pain pattern is classic:  subdiaphragmatic pain pattern suggests ibs:  Stereotypical, migratory with a very limited distribution of pain locations, daytime, not usually exacerbated by exercise  or coughing, worse in sitting position, frequently associated with generalized abd bloating, not as likely to be present supine (as is the case here)  due to the dome effect of the diaphragm which  is  canceled in that position. Frequently these patients have had multiple negative GI workups and CT scans.  Treatment consists of avoiding foods that cause gas (especially boiled eggs, mexcican food but especially  beans and undercooked vegetables like  spinach and some salads)  and citrucel 1 heaping tsp twice daily with a large glass of water.  Pain should improve w/in 2 weeks and if not then consider further GI work up.             Each maintenance medication was reviewed in detail including emphasizing most importantly the difference between maintenance and prns and under what circumstances the prns are to be triggered using an action plan format where appropriate.  Total time for H and P, chart review, counseling, teaching device and generating customized AVS unique to this office visit / charting = 30 min

## 2019-05-27 NOTE — Assessment & Plan Note (Addendum)
Onset 04/2006  - [facility] 02/20/18  PA mean = 41 with wedge 18 / CO 6.24  So PVR = 294 (nl < 250)  V/q lung scan neg for PE and not suggestive of PH  - 03/13/2018   Walked RA  2 laps @210  ft each stopped due to end of study, mod fast pace, min sob, no desats, started coughing at end PFT's  04/08/2018  FEV1 2.00 (89 % ) ratio 82  p 1 % improvement from saba p nothing  prior to study with DLCO  52 % corrects to 76  % for alv volume  And no significant curvature   -S/p TAVR 06/2018  - MV endocarditis 09/2018   Echo 12/02/2018 1. The left ventricle has normal systolic function, with an ejection fraction of 60-65%. The cavity size was normal. There is moderately increased left ventricular wall thickness. Left ventricular diastolic Doppler parameters are consistent with  impaired relaxation. Elevated left ventricular end-diastolic pressure. Nl LA GI diastolic dysfunction   2. The right ventricle has normal systolc function. The cavity was normal. There is no increase in right ventricular wall thickness. Right ventricular systolic pressure could not be assessed.  3. The mitral valve is degenerative. Mild thickening of the mitral valve leaflet. There is severe mitral annular calcification present. Mild mitral valve stenosis.  4. Pulmonic valve regurgitation was not assessed by color flow Doppler.  5. - TAVR: S/P TAVR that appears to be functioning normally. There is no perivavluarl AR. The mean AVG is normal at 32mHg. LVOT/AV VTI ratio:0.54. AV Vmax:279.00 cm/s. - 02/11/2019   Walked RA  2 laps @  approx 25109feach @ slow  pace  stopped due to end of study, sob but sats still 100%   - [facility] second opinion 05/22/19 :her dyspnea is due to severe impairment of diastolic inflow into the left heart in the setting of severe LVH and calcific mitral valve disease. Her cirrhosis and anemia causing high output are also contributing to her symptoms of shortness of breath. Optimizing her medical regimen would be  beneficial  Her hgb is continuing to drift down and she's becoming progressively deconditioned and looking for advice on how to manage this . Reviewed concept of sub max sustained ex aiming for 30 min daily but advised further input / rx from Dr SmTamala Juliannd no need for f/u here.   Anemia already being addressed elsewhere.   Pt informed of the seriousness of COVID 19 infection as a direct risk to lung health  and safey and to close contacts and should continue to wear a facemask in public and minimize exposure to public locations but especially avoid any area or activity where non-close contacts are not observing distancing or wearing an appropriate face mask.  I strongly recommended vaccine when offered.

## 2019-05-27 NOTE — Telephone Encounter (Signed)
Please advise 

## 2019-05-28 ENCOUNTER — Telehealth: Payer: Self-pay | Admitting: Adult Health

## 2019-05-28 ENCOUNTER — Other Ambulatory Visit: Payer: Self-pay

## 2019-05-28 ENCOUNTER — Other Ambulatory Visit (INDEPENDENT_AMBULATORY_CARE_PROVIDER_SITE_OTHER): Payer: Medicare Other

## 2019-05-28 ENCOUNTER — Other Ambulatory Visit: Payer: Self-pay | Admitting: Adult Health

## 2019-05-28 ENCOUNTER — Encounter: Payer: Self-pay | Admitting: Adult Health

## 2019-05-28 DIAGNOSIS — R300 Dysuria: Secondary | ICD-10-CM | POA: Diagnosis not present

## 2019-05-28 LAB — POCT URINALYSIS DIPSTICK
Glucose, UA: POSITIVE — AB
Leukocytes, UA: NEGATIVE
Protein, UA: NEGATIVE
Spec Grav, UA: 1.025 (ref 1.010–1.025)
Urobilinogen, UA: 0.2 E.U./dL
pH, UA: 6 (ref 5.0–8.0)

## 2019-05-28 NOTE — Telephone Encounter (Signed)
[facility] called stating that the pt is telling them that [facility] prescribed her Cipro. I reviewed messages and informed the pharmacy that [facility] had suggested a virtual visit with him. [facility] stated they will inform the pt to do a visit with [facility].   I told [facility] that I will send this over to [facility] as an [place]

## 2019-05-28 NOTE — Progress Notes (Signed)
Order for UA

## 2019-05-28 NOTE — Telephone Encounter (Signed)
Noted  

## 2019-05-29 ENCOUNTER — Telehealth (INDEPENDENT_AMBULATORY_CARE_PROVIDER_SITE_OTHER): Payer: Medicare Other | Admitting: Adult Health

## 2019-05-29 ENCOUNTER — Ambulatory Visit: Payer: Medicare Other

## 2019-05-29 DIAGNOSIS — R8290 Unspecified abnormal findings in urine: Secondary | ICD-10-CM

## 2019-05-29 NOTE — Telephone Encounter (Signed)
Spoke to pt to schedule VV. PT stated that she dropped off urine yesterday and was told that she will get Cipro. I advised pt that POC UA was neg for Leukocytes but positive for glucose. We are also waiting on Culture. Pt notified of update and will speak to [facility] at 3pm if appropriate.

## 2019-05-29 NOTE — Progress Notes (Signed)
Virtual Visit via Video Note  I connected with Breanna Webster on 05/29/19 at  3:00 PM EST by a video enabled telemedicine application and verified that I am speaking with the correct person using two identifiers.  Location patient: home Location provider:work or home office Persons participating in the virtual visit: patient, provider  I discussed the limitations of evaluation and management by telemedicine and the availability of in person appointments. The patient expressed understanding and agreed to proceed.   HPI: 74 year old female who is being evaluated today for concern of UTI.  Per patient report she was treated week and 1/2 to 2 weeks ago by her endocrinologist for a urinary tract infection.  She was treated with 5-day course of Keflex.  Her symptoms did not resolve and her endocrinologist advised her to follow-up with me?   She reports that her symptoms include cloudy urine and frequency.  She denies dysuria, hematuria, incomplete bladder emptying, low back pain.  She has not experienced any fevers or chills.  Did drop off a urine sample yesterday which was positive for glucose but otherwise negative.  And was sent for culture and has not resulted yet   ROS: See pertinent positives and negatives per HPI.  Past Medical History:  Diagnosis Date  . Anxiety   . Arthritis    "back" (04/22/2018)  . Blood transfusion without reported diagnosis   . CAD (coronary artery disease)    a. 03/2018 s/p PCI/DES to the RCA (3.0x15 Onyx DES).  . Carotid artery stenosis    Mild  . Chronic lower back pain   . Cirrhosis ([place])   . Colon polyps   . Diverticulitis   . Diverticulosis   . Esophageal thickening    seen on pre TAVR CT scan, also questionable cirrhosis. MRI recommended. Will refer to GI  . GERD (gastroesophageal reflux disease)   . Grave's disease   . History of colonic polyps 05/22/2017  . History of hiatal hernia   . Hypertension   . Hypothyroidism   . IBS (irritable bowel  syndrome)   . Osteopenia   . Pulmonary nodules    seen on pre TAVR CT. likley benign. no follow up recommended if pt low risk.  . S/P TAVR (transcatheter aortic valve replacement)   . Severe aortic stenosis   . Stroke ([place])   . Thalassemia minor   . Type II diabetes mellitus ([place])     Past Surgical History:  Procedure Laterality Date  . [place] STUDY N/A 03/03/2018   Procedure: [place] STUDY;  Surgeon: Mauri Pole, MD;  Location: [facility];  Service: Endoscopy;  Laterality: N/A;  . COLONOSCOPY    . COLONOSCOPY W/ BIOPSIES AND POLYPECTOMY    . CORONARY ANGIOGRAPHY Right 04/21/2018   Procedure: CORONARY ANGIOGRAPHY (CATH LAB);  Surgeon: [name], MD;  Location: [facility];  Service: Cardiovascular;  Laterality: Right;  . CORONARY STENT INTERVENTION N/A 04/22/2018   Procedure: CORONARY STENT INTERVENTION;  Surgeon: [name], MD;  Location: [facility];  Service: Cardiovascular;  Laterality: N/A;  . DILATION AND CURETTAGE OF UTERUS    . ESOPHAGEAL MANOMETRY N/A 03/03/2018   Procedure: ESOPHAGEAL MANOMETRY (EM);  Surgeon: Mauri Pole, MD;  Location: [facility];  Service: Endoscopy;  Laterality: N/A;  . GASTRIC FUNDOPLICATION    . HERNIA REPAIR    . HYSTEROSCOPY     fibroids  . LAPAROSCOPIC CHOLECYSTECTOMY    . LAPAROSCOPY     fibroids  .  NISSEN FUNDOPLICATION  [id number]  . POLYPECTOMY    . RIGHT/LEFT HEART CATH AND CORONARY ANGIOGRAPHY N/A 02/20/2018   Procedure: RIGHT/LEFT HEART CATH AND CORONARY ANGIOGRAPHY;  Surgeon: [name], MD;  Location: [facility];  Service: Cardiovascular;  Laterality: N/A;  . TEE WITHOUT CARDIOVERSION N/A 07/08/2018   Procedure: TRANSESOPHAGEAL ECHOCARDIOGRAM (TEE);  Surgeon: Burnell Blanks, MD;  Location: [facility];  Service: Open Heart Surgery;  Laterality: N/A;  . TEE WITHOUT CARDIOVERSION  10/07/2018  . TEE WITHOUT CARDIOVERSION N/A 10/07/2018   Procedure: TRANSESOPHAGEAL ECHOCARDIOGRAM  (TEE);  Surgeon: Jerline Pain, MD;  Location: [facility] ENDOSCOPY;  Service: Cardiovascular;  Laterality: N/A;  . TONSILLECTOMY    . TRANSCATHETER AORTIC VALVE REPLACEMENT, TRANSFEMORAL N/A 07/08/2018   Procedure: TRANSCATHETER AORTIC VALVE REPLACEMENT, TRANSFEMORAL;  Surgeon: Burnell Blanks, MD;  Location: [facility];  Service: Open Heart Surgery;  Laterality: N/A;    Family History  Adopted: Yes  Problem Relation Age of Onset  . Healthy Son        x 2  . Headache Other        Cluster headaches  . Heart failure Mother   . Colon cancer Neg Hx   . Pancreatic cancer Neg Hx   . Rectal cancer Neg Hx   . Stomach cancer Neg Hx        Current Outpatient Medications:  .  amitriptyline (ELAVIL) 25 MG tablet, TAKE 1 TABLET(25 MG) BY MOUTH AT BEDTIME, Disp: 90 tablet, Rfl: 1 .  apixaban (ELIQUIS) 5 MG TABS tablet, Take 1 tablet (5 mg total) by mouth 2 (two) times daily. (Patient taking differently: Take 5 mg by mouth 2 (two) times daily. Pt is currently taking this medication), Disp: 180 tablet, Rfl: 1 .  aspirin 81 MG tablet, Take 81 mg by mouth daily.  , Disp: , Rfl:  .  BD INSULIN SYRINGE U/F 31G X 5/16" 1 ML MISC, USE TO INJECT INSULIN EVERY DAY, Disp: 100 each, Rfl: 3 .  cholecalciferol (VITAMIN D) 1000 units tablet, Take 1,000 Units by mouth daily., Disp: , Rfl:  .  diltiazem (CARDIZEM CD) 120 MG 24 hr capsule, TAKE 1 CAPSULE(120 MG) BY MOUTH DAILY, Disp: 90 capsule, Rfl: 1 .  escitalopram (LEXAPRO) 20 MG tablet, TAKE 1 TABLET BY MOUTH EVERY DAY, Disp: 90 tablet, Rfl: 1 .  hydrochlorothiazide (HYDRODIURIL) 12.5 MG tablet, Take 1 tablet (12.5 mg total) by mouth daily., Disp: 90 tablet, Rfl: 1 .  insulin NPH Human (HUMULIN N) 100 UNIT/ML injection, Inject 1 mL (100 Units total) into the skin daily before breakfast., Disp: 30 mL, Rfl: 0 .  Lancets (ONETOUCH DELICA PLUS [id number]) MISC, USE TO CHECK BLOOD SUGAR TWICE DAILY.., Disp: 200 each, Rfl: 0 .  losartan (COZAAR) 50 MG tablet, TAKE 1  TABLET(50 MG) BY MOUTH DAILY, Disp: 90 tablet, Rfl: 1 .  Melatonin 5 MG TABS, Take 5 mg by mouth at bedtime as needed. , Disp: , Rfl:  .  metFORMIN (GLUCOPHAGE) 500 MG tablet, Take 500 mg by mouth 2 (two) times daily with a meal. , Disp: , Rfl:  .  Multiple Vitamin (MULITIVITAMIN WITH MINERALS) TABS, Take 1 tablet by mouth daily., Disp: , Rfl:  .  nitroGLYCERIN (NITROSTAT) 0.4 MG SL tablet, Place 1 tablet (0.4 mg total) under the tongue every 5 (five) minutes as needed for chest pain., Disp: 25 tablet, Rfl: 3 .  NOVOLIN R 100 UNIT/ML injection, INJECT 15 UNITS THREE TIMES DAILY BEFORE MEALS, Disp: , Rfl:  .  ONETOUCH VERIO test strip, USE TO MONITOR GLUCOSE LEVELS TWICE DAILY, Disp: 200 strip, Rfl: 3 .  pantoprazole (PROTONIX) 40 MG tablet, Take 1 tablet (40 mg total) by mouth daily., Disp: 90 tablet, Rfl: 3 .  SYNTHROID 125 MCG tablet, Take 125 mcg by mouth every morning., Disp: , Rfl:   EXAM:  VITALS per patient if applicable:  GENERAL: alert, oriented, appears well and in no acute distress  HEENT: atraumatic, conjunttiva clear, no obvious abnormalities on inspection of external nose and ears  NECK: normal movements of the head and neck  LUNGS: on inspection no signs of respiratory distress, breathing rate appears normal, no obvious gross SOB, gasping or wheezing  CV: no obvious cyanosis  MS: moves all visible extremities without noticeable abnormality  PSYCH/NEURO: pleasant and cooperative, no obvious depression or anxiety, speech and thought processing grossly intact  ASSESSMENT AND PLAN:  Discussed the following assessment and plan:  1. Cloudy urine -We will wait for her urine culture to come back and treat appropriately.  She was advised that cloudy urine may be resulted glucose in her urine.  Encouraged good blood sugar management.     I discussed the assessment and treatment plan with the patient. The patient was provided an opportunity to ask questions and all were  answered. The patient agreed with the plan and demonstrated an understanding of the instructions.   The patient was advised to call back or seek an in-person evaluation if the symptoms worsen or if the condition fails to improve as anticipated.   [name], NP

## 2019-05-30 LAB — URINE CULTURE
MICRO NUMBER:: 10116791
SPECIMEN QUALITY:: ADEQUATE

## 2019-06-01 ENCOUNTER — Other Ambulatory Visit: Payer: Self-pay

## 2019-06-01 ENCOUNTER — Inpatient Hospital Stay: Payer: Medicare Other | Attending: Hematology

## 2019-06-01 VITALS — BP 140/67 | HR 80 | Temp 98.0°F | Resp 18

## 2019-06-01 DIAGNOSIS — D509 Iron deficiency anemia, unspecified: Secondary | ICD-10-CM | POA: Diagnosis not present

## 2019-06-01 MED ORDER — SODIUM CHLORIDE 0.9 % IV SOLN
Freq: Once | INTRAVENOUS | Status: AC
  Filled 2019-06-01: qty 250

## 2019-06-01 MED ORDER — SODIUM CHLORIDE 0.9 % IV SOLN
750.0000 mg | Freq: Once | INTRAVENOUS | Status: AC
  Administered 2019-06-01: 750 mg via INTRAVENOUS
  Filled 2019-06-01: qty 15

## 2019-06-01 NOTE — Patient Instructions (Signed)
Ferric carboxymaltose injection °What is this medicine? °FERRIC CARBOXYMALTOSE (ferr-ik car-box-ee-mol-toes) is an iron complex. Iron is used to make healthy red blood cells, which carry oxygen and nutrients throughout the body. This medicine is used to treat anemia in people with chronic kidney disease or people who cannot take iron by mouth. °This medicine may be used for other purposes; ask your health care provider or pharmacist if you have questions. °COMMON BRAND NAME(S): Injectafer °What should I tell my health care provider before I take this medicine? °They need to know if you have any of these conditions: °· high levels of iron in the blood °· liver disease °· an unusual or allergic reaction to iron, other medicines, foods, dyes, or preservatives °· pregnant or trying to get pregnant °· breast-feeding °How should I use this medicine? °This medicine is for infusion into a vein. It is given by a health care professional in a hospital or clinic setting. °Talk to your pediatrician regarding the use of this medicine in children. Special care may be needed. °Overdosage: If you think you have taken too much of this medicine contact a poison control center or emergency room at once. °NOTE: This medicine is only for you. Do not share this medicine with others. °What if I miss a dose? °It is important not to miss your dose. Call your doctor or health care professional if you are unable to keep an appointment. °What may interact with this medicine? °Do not take this medicine with any of the following medications: °· deferoxamine °· dimercaprol °· other iron products °This list may not describe all possible interactions. Give your health care provider a list of all the medicines, herbs, non-prescription drugs, or dietary supplements you use. Also tell them if you smoke, drink alcohol, or use illegal drugs. Some items may interact with your medicine. °What should I watch for while using this medicine? °Visit your  doctor or health care professional regularly. Tell your doctor if your symptoms do not start to get better or if they get worse. You may need blood work done while you are taking this medicine. °You may need to follow a special diet. Talk to your doctor. Foods that contain iron include: whole grains/cereals, dried fruits, beans, or peas, leafy green vegetables, and organ meats (liver, kidney). °What side effects may I notice from receiving this medicine? °Side effects that you should report to your doctor or health care professional as soon as possible: °· allergic reactions like skin rash, itching or hives, swelling of the face, lips, or tongue °· dizziness °· facial flushing °Side effects that usually do not require medical attention (report to your doctor or health care professional if they continue or are bothersome): °· changes in taste °· constipation °· headache °· nausea, vomiting °· pain, redness, or irritation at site where injected °This list may not describe all possible side effects. Call your doctor for medical advice about side effects. You may report side effects to FDA at 1-800-FDA-1088. °Where should I keep my medicine? °This drug is given in a hospital or clinic and will not be stored at home. °NOTE: This sheet is a summary. It may not cover all possible information. If you have questions about this medicine, talk to your doctor, pharmacist, or health care provider. °© 2020 Elsevier/Gold Standard (2016-05-24 09:40:29) ° °

## 2019-06-02 ENCOUNTER — Other Ambulatory Visit: Payer: Self-pay | Admitting: Adult Health

## 2019-06-02 MED ORDER — CIPROFLOXACIN HCL 500 MG PO TABS: 500.0000 mg | ORAL_TABLET | Freq: Two times a day (BID) | ORAL | 0 refills | Status: AC

## 2019-06-05 ENCOUNTER — Other Ambulatory Visit: Payer: Self-pay | Admitting: Adult Health

## 2019-06-05 DIAGNOSIS — Z760 Encounter for issue of repeat prescription: Secondary | ICD-10-CM

## 2019-06-06 ENCOUNTER — Ambulatory Visit: Payer: Medicare Other | Attending: Internal Medicine

## 2019-06-06 DIAGNOSIS — Z23 Encounter for immunization: Secondary | ICD-10-CM

## 2019-06-06 NOTE — Progress Notes (Signed)
   Covid-19 Vaccination Clinic  Name:  LAKEETA DOBOSZ    MRN: [id number] DOB: [birth year]  06/06/2019  Ms. Rasmussen was observed post Covid-19 immunization for 15 minutes without incidence. She was provided with Vaccine Information Sheet and instruction to access the V-Safe system.   Ms. Biehler was instructed to call 911 with any severe reactions post vaccine: Marland Kitchen Difficulty breathing  . Swelling of your face and throat  . A fast heartbeat  . A bad rash all over your body  . Dizziness and weakness    Immunizations Administered    Name Date Dose VIS Date Route   Pfizer COVID-19 Vaccine 06/06/2019  1:44 PM 0.3 mL 04/03/2019 Intramuscular   Manufacturer: [place]   Lot: [id number]   [place]: [id number]

## 2019-06-07 ENCOUNTER — Ambulatory Visit: Payer: Medicare Other

## 2019-06-08 ENCOUNTER — Ambulatory Visit: Payer: Medicare Other

## 2019-06-09 ENCOUNTER — Other Ambulatory Visit: Payer: Self-pay | Admitting: Adult Health

## 2019-06-09 DIAGNOSIS — R519 Headache, unspecified: Secondary | ICD-10-CM

## 2019-06-10 ENCOUNTER — Ambulatory Visit (INDEPENDENT_AMBULATORY_CARE_PROVIDER_SITE_OTHER): Payer: Medicare Other | Admitting: Nurse Practitioner

## 2019-06-10 ENCOUNTER — Encounter: Payer: Self-pay | Admitting: Nurse Practitioner

## 2019-06-10 VITALS — BP 110/66 | HR 60 | Temp 98.7°F | Ht 65.0 in | Wt 192.0 lb

## 2019-06-10 DIAGNOSIS — R0781 Pleurodynia: Secondary | ICD-10-CM | POA: Diagnosis not present

## 2019-06-10 DIAGNOSIS — K7460 Unspecified cirrhosis of liver: Secondary | ICD-10-CM | POA: Diagnosis not present

## 2019-06-10 DIAGNOSIS — R140 Abdominal distension (gaseous): Secondary | ICD-10-CM | POA: Diagnosis not present

## 2019-06-10 NOTE — Progress Notes (Signed)
IMPRESSION and PLAN:    74 year old female with a pmh significant for but not limited to short segment Barrett's esophagus, multiple colon polyps, cirrhosis, CVA, chronic dyspnea of unclear etiology, thalassemia minor, IDA requiring iron infusions.  # Left ribcage pain.  -Started a few days ago.  Pain is strictly related to movement/ activity and hurts when she presses on the area.  No associated skin lesions to . Etiology of pain unclear but seems musculoskeletal rather than GI related.   -This is my first time seeing patient today.  She has cirrhosis. Her abdomen is rather protuberant.  Flanks are a little dull to palpation.  Cannot exclude ascites.  She complains of being bloated and weight gain though her weight is stable since last visit in January. Doubt significant ascites but will obtain abdominal U/S to evaluate -If ultrasound negative consider a short course of NSAIDs for possible musculoskeletal pain.  Will need to exercise caution given that she has cirrhosis  # Cirrhosis. -Recent cirrhosis follow-up with Dr. Havery Moros 04/29/2019.  She does not have another routine follow-up scheduled but I will arrange for this   HPI:    Primary GI: Dr. Havery Moros  Chief complaint : Pain under left breast   Breanna Webster is a 74 y.o. female with a pmh significant for but not limited to short segment Barrett's esophagus, multiple colon polyps, cirrhosis, CVA, chronic dyspnea of unclear etiology, thalassemia minor, IDA requiring iron infusions.  Patient last seen in follow-up by Dr. Havery Moros to 04/29/2019.  Cirrhosis compensated at the time.  Breanna Webster comes in with complaints of pain under left breast over last several days.  The pain is there when she bends, twists or presses on the area.  No known trauma to the left ribs.  She complains of feeling bloated with a 4 pound weight gain.  No nausea, fevers.  Review of systems:     No chest pain, no SOB, no fevers, no urinary sx   Past  Medical History:  Diagnosis Date  . Anxiety   . Arthritis    "back" (04/22/2018)  . Blood transfusion without reported diagnosis   . CAD (coronary artery disease)    a. 03/2018 s/p PCI/DES to the RCA (3.0x15 Onyx DES).  . Carotid artery stenosis    Mild  . Chronic lower back pain   . Cirrhosis ([place])   . Colon polyps   . Diverticulitis   . Diverticulosis   . Esophageal thickening    seen on pre TAVR CT scan, also questionable cirrhosis. MRI recommended. Will refer to GI  . GERD (gastroesophageal reflux disease)   . Grave's disease   . History of colonic polyps 05/22/2017  . History of hiatal hernia   . Hypertension   . Hypothyroidism   . IBS (irritable bowel syndrome)   . Osteopenia   . Pulmonary nodules    seen on pre TAVR CT. likley benign. no follow up recommended if pt low risk.  . S/P TAVR (transcatheter aortic valve replacement)   . Severe aortic stenosis   . Stroke ([place])   . Thalassemia minor   . Type II diabetes mellitus ([facility])     Patient's surgical history, family medical history, social history, medications and allergies were all reviewed in Epic   Creatinine clearance cannot be calculated (Patient's most recent lab result is older than the maximum 21 days allowed.)  Current Outpatient Medications  Medication Sig Dispense Refill  . amitriptyline (  ELAVIL) 25 MG tablet TAKE 1 TABLET(25 MG) BY MOUTH AT BEDTIME 90 tablet 1  . aspirin 81 MG tablet Take 81 mg by mouth daily.      . BD INSULIN SYRINGE U/F 31G X 5/16" 1 ML MISC USE TO INJECT INSULIN EVERY DAY 100 each 3  . cholecalciferol (VITAMIN D) 1000 units tablet Take 1,000 Units by mouth daily.    Marland Kitchen diltiazem (CARDIZEM CD) 120 MG 24 hr capsule TAKE 1 CAPSULE(120 MG) BY MOUTH DAILY 90 capsule 1  . escitalopram (LEXAPRO) 20 MG tablet TAKE 1 TABLET(20 MG) BY MOUTH DAILY 90 tablet 1  . hydrochlorothiazide (HYDRODIURIL) 12.5 MG tablet Take 1 tablet (12.5 mg total) by mouth daily. 90 tablet 1  . insulin NPH Human  (HUMULIN N) 100 UNIT/ML injection Inject 1 mL (100 Units total) into the skin daily before breakfast. 30 mL 0  . Lancets (ONETOUCH DELICA PLUS [id number]) MISC USE TO CHECK BLOOD SUGAR TWICE DAILY.. 200 each 0  . losartan (COZAAR) 50 MG tablet TAKE 1 TABLET(50 MG) BY MOUTH DAILY 90 tablet 1  . Melatonin 5 MG TABS Take 5 mg by mouth at bedtime as needed.     . metFORMIN (GLUCOPHAGE) 500 MG tablet Take 500 mg by mouth 2 (two) times daily with a meal.     . Multiple Vitamin (MULITIVITAMIN WITH MINERALS) TABS Take 1 tablet by mouth daily.    . nitroGLYCERIN (NITROSTAT) 0.4 MG SL tablet Place 1 tablet (0.4 mg total) under the tongue every 5 (five) minutes as needed for chest pain. 25 tablet 3  . NOVOLIN R 100 UNIT/ML injection INJECT 15 UNITS THREE TIMES DAILY BEFORE MEALS    . ONETOUCH VERIO test strip USE TO MONITOR GLUCOSE LEVELS TWICE DAILY 200 strip 3  . pantoprazole (PROTONIX) 40 MG tablet Take 1 tablet (40 mg total) by mouth daily. 90 tablet 3  . SYNTHROID 125 MCG tablet Take 125 mcg by mouth every morning.    Marland Kitchen apixaban (ELIQUIS) 5 MG TABS tablet Take 1 tablet (5 mg total) by mouth 2 (two) times daily. (Patient taking differently: Take 5 mg by mouth 2 (two) times daily. Pt is currently taking this medication) 180 tablet 1   No current facility-administered medications for this visit.    Physical Exam:     BP 110/66   Pulse 60   Temp 98.7 F (37.1 C)   Ht 5' 5"  (1.651 m)   Wt 192 lb (87.1 kg)   BMI 31.95 kg/m   GENERAL:  Pleasant female in NAD PSYCH: : Cooperative, normal affect EENT:  conjunctiva pink, mucous membranes moist, neck supple without masses CARDIAC:  RRR,  no peripheral edema PULM: Normal respiratory effort, lungs CTA bilaterally, no wheezing ABDOMEN:  Protuberant, soft, nontender. No obvious masses, no hepatomegaly,  normal bowel sounds SKIN:  turgor, no lesions seen Musculoskeletal:  Pain reproducible with palpation of left lower anterior ribcage NEURO: Alert and  oriented x 3, no focal neurologic deficits   Tye Savoy , NP 06/10/2019, 11:12 AM

## 2019-06-10 NOTE — Patient Instructions (Signed)
If you are age 76 or older, your body mass index should be between 23-30. Your Body mass index is 31.95 kg/m. If this is out of the aforementioned range listed, please consider follow up with your Primary Care Provider.  If you are age 33 or younger, your body mass index should be between 19-25. Your Body mass index is 31.95 kg/m. If this is out of the aformentioned range listed, please consider follow up with your Primary Care Provider.   You have been scheduled for an abdominal ultrasound at [facility] (1st floor of hospital) on Wednesday 06/17/19 at 10 am. Please arrive 15 minutes prior to your appointment for registration. Make certain not to have anything to eat or drink 6 hours prior to your appointment. Should you need to reschedule your appointment, please contact radiology at [phone]. This test typically takes about 30 minutes to perform.  Follow up with Dr. Havery Moros on Thursday March 18th at 10:10 am.

## 2019-06-10 NOTE — Progress Notes (Signed)
Agree with assessment and plan as outlined. In this patient on anticoagulation and history of cirrhosis would avoid use of NSAIDs, hopefully this improves with time, can try some topical muscle rub otherwise.

## 2019-06-17 ENCOUNTER — Other Ambulatory Visit: Payer: Self-pay

## 2019-06-17 ENCOUNTER — Ambulatory Visit (HOSPITAL_COMMUNITY)
Admission: RE | Admit: 2019-06-17 | Discharge: 2019-06-17 | Disposition: A | Discharge status: Home / Self Care | Payer: Medicare Other | Source: Ambulatory Visit | Attending: Nurse Practitioner | Admitting: Nurse Practitioner

## 2019-06-17 DIAGNOSIS — K7460 Unspecified cirrhosis of liver: Secondary | ICD-10-CM | POA: Diagnosis not present

## 2019-06-17 DIAGNOSIS — R0781 Pleurodynia: Secondary | ICD-10-CM | POA: Insufficient documentation

## 2019-06-17 DIAGNOSIS — N281 Cyst of kidney, acquired: Secondary | ICD-10-CM | POA: Diagnosis not present

## 2019-06-17 DIAGNOSIS — R140 Abdominal distension (gaseous): Secondary | ICD-10-CM | POA: Diagnosis not present

## 2019-06-18 ENCOUNTER — Other Ambulatory Visit: Payer: Self-pay | Admitting: Family Medicine

## 2019-06-18 DIAGNOSIS — I10 Essential (primary) hypertension: Secondary | ICD-10-CM

## 2019-06-18 MED ORDER — HYDROCHLOROTHIAZIDE 12.5 MG PO TABS: 12.5000 mg | ORAL_TABLET | Freq: Every day | ORAL | 1 refills | Status: DC

## 2019-06-19 ENCOUNTER — Encounter: Payer: Self-pay | Admitting: Family Medicine

## 2019-06-29 NOTE — Progress Notes (Signed)
[facility] [place]                                       Cardiology Office Note    Date:  07/01/2019   ID:  Breanna Webster, DOB 1945/12/07, MRN [id number]  PCP:  [name], NP  Cardiologist: Dr. Tamala Julian / Dr.McAlhany & Dr. Cyndia Bent (TAVR)  CC: 1 year s/p TAVR  History of Present Illness:  Breanna Webster is a 74 y.o. female with a history of DMT2, graves disease, hypothyroidism, HTN, pulm HTN, CAD s/p PCI/DES x1 to mRCA (03/2018), mitral valve endocarditis 09/2018, PAF, cerebellar CVA 10/2018, thalassemia, NASH cirrhosis, iron deficiency anemia, and severe AS s/p TAVR (06/2018) who presents to clinic for 1 year follow up.   She has a history of CAD with PCI in December 2019. She has mitral valve prolapse without significant mitral regurgitation. Sheunderwent successful TAVR with a4m Edwards Sapien 3 THV via the TF approach on 07/08/18. Post operative echoshowed normally functioning TAVR with mean gradient of 14 mm Hg and no PVL. 1 month showed normal LV function with noramlly functioning TAVR with no PVL; mean gradient 14 mm Hg. Unfortunately, within a couple of months the patient had bacteremia/ endocarditis from Strep [place]iv[place]. She was treated with IV antibiotic therapy for approximately six weeks between June and July of 2020. During that hospitalization she had new onset atrial fibrillation and bilateral cerebellar strokes. She was started on Eliquis. She has had ongoing dyspnea since her TAVR. [facility]. He felt her ongoing dyspnea was 2/2 severe impairment of diastolic inflow into the left heart in the setting of severe LVH and calcific mitral valve disease. Her cirrhosis and anemia causing high output are also contributing to her symptoms of shortness of breath. He recommended adding low dose lasix and spiro, but it does not appear that any medication changes were made.   Today she  presents to clinic for follow up. Feels like she gets a mental block since TAVR. She stopped taking HCTZ because she hates having to urinate so much. SHe continues to have dyspnea and fatigue. She has to take a lot of naps, which she doesn't think is normal for her age. She gets short of breath just walking across the room. No CP. No LE edema, orthopnea or PND. No dizziness or syncope. No blood in stool or urine. No palpitations.     Past Medical History:  Diagnosis Date  . Anxiety   . Arthritis    "back" (04/22/2018)  . Blood transfusion without reported diagnosis   . CAD (coronary artery disease)    a. 03/2018 s/p PCI/DES to the RCA (3.0x15 Onyx DES).  . Carotid artery stenosis    Mild  . Chronic lower back pain   . Cirrhosis ([place]ronx   . Colon polyps   . Diverticulitis   . Diverticulosis   . Esophageal thickening    seen on pre TAVR CT scan, also questionable cirrhosis. MRI recommended. Will refer to GI  . GERD (gastroesophageal reflux disease)   . Grave's disease   . History of colonic polyps 05/22/2017  . History of hiatal hernia   . Hypertension   . Hypothyroidism   . IBS (irritable bowel syndrome)   . Osteopenia   . Pulmonary nodules    seen on pre  TAVR CT. likley benign. no follow up recommended if pt low risk.  . S/P TAVR (transcatheter aortic valve replacement)   . Severe aortic stenosis   . Stroke ([place])   . Thalassemia minor   . Type II diabetes mellitus ([place])     Past Surgical History:  Procedure Laterality Date  . [place] STUDY N/A 03/03/2018   Procedure: [place] STUDY;  Surgeon: Mauri Pole, MD;  Location: [facility];  Service: Endoscopy;  Laterality: N/A;  . COLONOSCOPY    . COLONOSCOPY W/ BIOPSIES AND POLYPECTOMY    . CORONARY ANGIOGRAPHY Right 04/21/2018   Procedure: CORONARY ANGIOGRAPHY (CATH LAB);  Surgeon: [name], MD;  Location: [place] CV LAB;  Service: Cardiovascular;  Laterality: Right;  . CORONARY STENT INTERVENTION N/A  04/22/2018   Procedure: CORONARY STENT INTERVENTION;  Surgeon: [name], MD;  Location: [place] CV LAB;  Service: Cardiovascular;  Laterality: N/A;  . DILATION AND CURETTAGE OF UTERUS    . ESOPHAGEAL MANOMETRY N/A 03/03/2018   Procedure: ESOPHAGEAL MANOMETRY (EM);  Surgeon: Mauri Pole, MD;  Location: [facility];  Service: Endoscopy;  Laterality: N/A;  . GASTRIC FUNDOPLICATION    . HERNIA REPAIR    . HYSTEROSCOPY     fibroids  . LAPAROSCOPIC CHOLECYSTECTOMY    . LAPAROSCOPY     fibroids  . NISSEN FUNDOPLICATION  5621H  . POLYPECTOMY    . RIGHT/LEFT HEART CATH AND CORONARY ANGIOGRAPHY N/A 02/20/2018   Procedure: RIGHT/LEFT HEART CATH AND CORONARY ANGIOGRAPHY;  Surgeon: [name], MD;  Location: [place] CV LAB;  Service: Cardiovascular;  Laterality: N/A;  . TEE WITHOUT CARDIOVERSION N/A 07/08/2018   Procedure: TRANSESOPHAGEAL ECHOCARDIOGRAM (TEE);  Surgeon: Burnell Blanks, MD;  Location: [place];  Service: Open Heart Surgery;  Laterality: N/A;  . TEE WITHOUT CARDIOVERSION  10/07/2018  . TEE WITHOUT CARDIOVERSION N/A 10/07/2018   Procedure: TRANSESOPHAGEAL ECHOCARDIOGRAM (TEE);  Surgeon: Jerline Pain, MD;  Location: [facility] ENDOSCOPY;  Service: Cardiovascular;  Laterality: N/A;  . TONSILLECTOMY    . TRANSCATHETER AORTIC VALVE REPLACEMENT, TRANSFEMORAL N/A 07/08/2018   Procedure: TRANSCATHETER AORTIC VALVE REPLACEMENT, TRANSFEMORAL;  Surgeon: Burnell Blanks, MD;  Location: [place];  Service: Open Heart Surgery;  Laterality: N/A;    Current Medications: Outpatient Medications Prior to Visit  Medication Sig Dispense Refill  . amitriptyline (ELAVIL) 25 MG tablet TAKE 1 TABLET(25 MG) BY MOUTH AT BEDTIME 90 tablet 1  . amoxicillin (AMOXIL) 500 MG capsule Take 2,000 mg by mouth daily.    Marland Kitchen apixaban (ELIQUIS) 5 MG TABS tablet Take 1 tablet (5 mg total) by mouth 2 (two) times daily. 180 tablet 1  . aspirin 81 MG tablet Take 81 mg by mouth daily.      . BD  INSULIN SYRINGE U/F 31G X 5/16" 1 ML MISC USE TO INJECT INSULIN EVERY DAY 100 each 3  . cholecalciferol (VITAMIN D) 1000 units tablet Take 1,000 Units by mouth daily.    Marland Kitchen diltiazem (CARDIZEM CD) 120 MG 24 hr capsule TAKE 1 CAPSULE(120 MG) BY MOUTH DAILY 90 capsule 1  . escitalopram (LEXAPRO) 20 MG tablet TAKE 1 TABLET(20 MG) BY MOUTH DAILY 90 tablet 1  . insulin NPH Human (HUMULIN N) 100 UNIT/ML injection Inject 1 mL (100 Units total) into the skin daily before breakfast. 30 mL 0  . Lancets (ONETOUCH DELICA PLUS [id number]) MISC USE TO CHECK BLOOD SUGAR TWICE DAILY.. 200 each 0  . losartan (COZAAR) 50 MG  tablet TAKE 1 TABLET(50 MG) BY MOUTH DAILY 90 tablet 1  . metFORMIN (GLUCOPHAGE) 500 MG tablet Take 500 mg by mouth 2 (two) times daily with a meal.     . Multiple Vitamin (MULITIVITAMIN WITH MINERALS) TABS Take 1 tablet by mouth daily.    Marland Kitchen NOVOLIN R 100 UNIT/ML injection INJECT 15 UNITS THREE TIMES DAILY BEFORE MEALS    . ONETOUCH VERIO test strip USE TO MONITOR GLUCOSE LEVELS TWICE DAILY 200 strip 3  . pantoprazole (PROTONIX) 40 MG tablet Take 1 tablet (40 mg total) by mouth daily. 90 tablet 3  . SYNTHROID 125 MCG tablet Take 125 mcg by mouth every morning.    . hydrochlorothiazide (HYDRODIURIL) 12.5 MG tablet Take 1 tablet (12.5 mg total) by mouth daily. (Patient not taking: Reported on 07/01/2019) 90 tablet 1  . Melatonin 5 MG TABS Take 5 mg by mouth at bedtime as needed.     . nitroGLYCERIN (NITROSTAT) 0.4 MG SL tablet Place 1 tablet (0.4 mg total) under the tongue every 5 (five) minutes as needed for chest pain. (Patient not taking: Reported on 07/01/2019) 25 tablet 3   No facility-administered medications prior to visit.     Allergies:   Statins   Social History   Socioeconomic History  . Marital status: Married    Spouse name: Not on file  . Number of children: 2  . Years of education: Not on file  . Highest education level: Not on file  Occupational History  . Occupation:  Tree surgeon of the [employer]  Tobacco Use  . Smoking status: Never Smoker  . Smokeless tobacco: Never Used  Substance and Sexual Activity  . Alcohol use: No    Alcohol/week: 0.0 standard drinks  . Drug use: Never  . Sexual activity: Not Currently  Other Topics Concern  . Not on file  Social History Narrative   Lives with husband in a one story home.     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   Education: college.   Social Determinants of Health   Financial Resource Strain: Low Risk   . Difficulty of Paying Living Expenses: Not hard at all  Food Insecurity:   . Worried About Charity fundraiser in the Last Year: Not on file  . Ran Out of Food in the Last Year: Not on file  Transportation Needs: No Transportation Needs  . Lack of Transportation (Medical): No  . Lack of Transportation (Non-Medical): No  Physical Activity:   . Days of Exercise per Week: Not on file  . Minutes of Exercise per Session: Not on file  Stress: Stress Concern Present  . Feeling of Stress : To some extent  Social Connections:   . Frequency of Communication with Friends and Family: Not on file  . Frequency of Social Gatherings with Friends and Family: Not on file  . Attends Religious Services: Not on file  . Active Member of Clubs or Organizations: Not on file  . Attends Archivist Meetings: Not on file  . Marital Status: Not on file     Family History:  The patient's family history includes Headache in an other family member; Healthy in her son; Heart failure in her mother. She was adopted.     ROS:   Please see the history of present illness.    ROS All other systems reviewed and are negative.   PHYSICAL EXAM:   VS:  BP 122/66   Pulse 78   Ht _0  (1.651 m)   SpO2 95%   BMI 31.95 kg/m    GEN: Well nourished, well developed, in no acute distress HEENT: normal Neck: no JVD or masses Cardiac:  RRR; soft flow murmur. No rubs, or gallops,no edema  Respiratory:  clear to auscultation bilaterally, normal work of breathing GI: protuberant abdomen MS: no deformity or atrophy Skin: warm and dry, no rash Neuro:  Alert and Oriented x 3, Strength and sensation are intact Psych: euthymic mood, full affect   Wt Readings from Last 3 Encounters:  06/10/19 192 lb (87.1 kg)  05/26/19 192 lb (87.1 kg)  04/30/19 193 lb 9.6 oz (87.8 kg)      Studies/Labs Reviewed:   EKG:  EKG is NOT ordered today.    Recent Labs: 07/07/2018: B Natriuretic Peptide 112.2 10/23/2018: Magnesium 1.7 04/30/2019: Hemoglobin 8.9; Platelets 208 05/11/2019: ALT 69; BUN 18; Creatinine 1.0; Potassium 4.2; Sodium 139; TSH 14.81   Lipid Panel    Component Value Date/Time   CHOL 128 10/22/2018 0445   CHOL 156 11/21/2016 1113   TRIG 95 10/22/2018 0445   HDL 24 (L) 10/22/2018 0445   HDL 44 11/21/2016 1113   CHOLHDL 5.3 10/22/2018 0445   VLDL 19 10/22/2018 0445   LDLCALC 85 10/22/2018 0445   LDLCALC 91 11/21/2016 1113   LDLDIRECT 98.0 05/24/2014 1343    Additional studies/ records that were reviewed today include:  TAVR OPERATIVE NOTE   Date of Procedure:07/08/2018  Preoperative Diagnosis:Severe Aortic Stenosis   Postoperative Diagnosis:Same   Procedure:   Transcatheter Aortic Valve Replacement - Transfemoral Approach Edwards Sapien 3 THV (size 54m, model # [id number] serial ##[id number]  Co-Surgeons:Christopher MAngelena Form MD and BGaye Pollack MD   Anesthesiologist:Ellender  Echocardiographer:Nelson  Pre-operative Echo Findings: ? Severe aortic stenosis ? Normalleft ventricular systolic function  Post-operative Echo Findings: ? Noparavalvular leak ? Normalleft ventricular systolic function _____________   Echo 07/09/18: IMPRESSIONS 1. The left ventricle has hyperdynamic  systolic function, with an ejection fraction of >65%. The cavity size was normal. There is moderately increased left ventricular wall thickness. Left ventricular diastolic Doppler parameters are consistent with  impaired relaxation. 2. The right ventricle has normal systolic function. The cavity was normal. There is no increase in right ventricular wall thickness. 3. Left atrial size was severely dilated. 4. No stenosis of the aortic valve. 5. The TAVR seems to be functioning normally. 6. The interatrial septum was not assessed.  _____________  Echo 08/28/2018: IMPRESSIONS 1. The left ventricle has normal systolic function with an ejection fraction of 60-65%. The cavity size was normal. Indeterminate diastolic filling due to E-A fusion. Indeterminate filling pressures. 2. The right ventricle has normal systolic function. The cavity was normal. There is no increase in right ventricular wall thickness. 3. Left atrial size was moderately dilated. 4. The mitral valve is degenerative. Severe thickening of the mitral valve leaflet. Severe calcification of the mitral valve leaflet. There is severe mitral annular calcification present. Mild mitral valve stenosis. 5. Severe MAC and leaflet thickening mild functional stenosis gradients lower than echo 07/09/18. 6. The aortic root is normal in size and structure. 7. TAVR: 23 mm Sapien 3 in good position with no PVL gradients stable since 07/09/18.  _____________  Echo 07/01/19 IMPRESSIONS    1. Left ventricular ejection fraction, by estimation, is 60 to 65%. The  left ventricle has normal function. The left ventricle has no regional  wall motion abnormalities. There is mild  left ventricular hypertrophy.  Left ventricular diastolic parameters  are consistent with Grade II diastolic dysfunction (pseudonormalization).  2. Right ventricular systolic function is normal. The right ventricular  size is normal.  3. Left atrial size was  moderately dilated.  4. The mitral valve is grossly normal. No evidence of mitral valve  regurgitation.  5. The TAVR is not well visualized. There is no PVL or AI. The mean AV  gradient is normal at 13 mmHg. No significant changes . Marland Kitchen The aortic valve  has been repaired/replaced. Aortic valve regurgitation is not visualized.  Procedure Date: 07/08/18.    ASSESSMENT & PLAN:   Severe LFLG ASs/p TAVR: echo showed EF 60%, normally functioning TAVR with mean at 51m Hg and no PVL. She has NYHA class III symptoms, which is chronic and unchanged. Still on aspirin and Eliquis,  can stop aspirin at this time. SBE prophylaxis discussed; I have RX'd amoxicillin.   HTN: BP well controlled today. No changes made.  CAD: s/p DES to mRCA (03/2018). Can stop aspirin at this time as it has been 1 yaer out from stenting and requires long term Eliquis. Continue medical therapy  PAF: sound regular on exam. Continue on Eliquis.  Medication Adjustments/Labs and Tests Ordered: Current medicines are reviewed at length with the patient today.  Concerns regarding medicines are outlined above.  Medication changes, Labs and Tests ordered today are listed in the Patient Instructions below. There are no Patient Instructions on file for this visit.   Signed, KAngelena Form PA-C  07/01/2019 3:25 PM    [facility] [address] [place]  [id number]: [phone] Fax: [phone]

## 2019-07-01 ENCOUNTER — Other Ambulatory Visit: Payer: Self-pay

## 2019-07-01 ENCOUNTER — Ambulatory Visit (INDEPENDENT_AMBULATORY_CARE_PROVIDER_SITE_OTHER): Payer: Medicare Other | Admitting: Physician Assistant

## 2019-07-01 ENCOUNTER — Encounter: Payer: Self-pay | Admitting: Physician Assistant

## 2019-07-01 ENCOUNTER — Ambulatory Visit (HOSPITAL_COMMUNITY): Payer: Medicare Other | Attending: Cardiovascular Disease

## 2019-07-01 VITALS — BP 122/66 | HR 78 | Ht 65.0 in | Wt 195.1 lb

## 2019-07-01 DIAGNOSIS — Z952 Presence of prosthetic heart valve: Secondary | ICD-10-CM

## 2019-07-01 DIAGNOSIS — I2510 Atherosclerotic heart disease of native coronary artery without angina pectoris: Secondary | ICD-10-CM

## 2019-07-01 DIAGNOSIS — I480 Paroxysmal atrial fibrillation: Secondary | ICD-10-CM | POA: Diagnosis not present

## 2019-07-01 DIAGNOSIS — I10 Essential (primary) hypertension: Secondary | ICD-10-CM

## 2019-07-01 DIAGNOSIS — I4891 Unspecified atrial fibrillation: Secondary | ICD-10-CM | POA: Insufficient documentation

## 2019-07-01 NOTE — Patient Instructions (Signed)
Medication Instructions:  Your provider recommends that you continue on your current medications as directed. Please refer to the Current Medication list given to you today.   *If you need a refill on your cardiac medications before your next appointment, please call your pharmacy*  Follow-Up: At [facility], you and your health needs are our priority.  As part of our continuing mission to provide you with exceptional heart care, we have created designated Provider Care Teams.  These Care Teams include your primary Cardiologist (physician) and Advanced Practice Providers (APPs -  Physician Assistants and Nurse Practitioners) who all work together to provide you with the care you need, when you need it. Your next appointment:   6 month(s) The format for your next appointment:   In Person Provider:   You may see Sinclair Grooms, MD or one of the following Advanced Practice Providers on your designated Care Team:    Truitt Merle, NP  Cecilie Kicks, NP  Kathyrn Drown, NP

## 2019-07-09 ENCOUNTER — Other Ambulatory Visit: Payer: Self-pay | Admitting: Adult Health

## 2019-07-09 ENCOUNTER — Ambulatory Visit: Payer: Self-pay | Admitting: Gastroenterology

## 2019-07-09 ENCOUNTER — Telehealth: Payer: Self-pay | Admitting: Physician Assistant

## 2019-07-09 DIAGNOSIS — Z760 Encounter for issue of repeat prescription: Secondary | ICD-10-CM

## 2019-07-09 NOTE — Telephone Encounter (Signed)
I tried to call Dr. Leland Her office with [facility] and was put on hold for over 5 minutes. I will attempt to call again later.

## 2019-07-09 NOTE — Telephone Encounter (Signed)
Follow Up:     Crystal from Dr Leland Her Office called. She was checking on the status of clearance that ws sent over yesterday. She need this fax asap to [phone].

## 2019-07-10 NOTE — Telephone Encounter (Signed)
Pre op call back will need to try and reach out again to Dr. Leland Her office for clearance information. Dr. Leland Her office is [employer]

## 2019-07-10 NOTE — Telephone Encounter (Signed)
I s/w Jessica at Dr. Leland Her office. I informed Janett Billow that we still have not received the fax. I informed her that we did try to reach them yesterday though we could never get through. Janett Billow is going to re-fax clearance request right now.

## 2019-07-10 NOTE — Telephone Encounter (Signed)
There is no pre op clearance request in chart.  Kerin Ransom PA-C 07/10/2019 9:40 AM

## 2019-07-10 NOTE — Telephone Encounter (Signed)
Still no clearance in chart.  I called the patient and had to leave a message. I reminded her that she will need SBE prophylaxis prior to her dental work. If it is routine dental work she will not need to hold Eliquis.  Kerin Ransom PA-C 07/10/2019 4:27 PM

## 2019-07-13 NOTE — Telephone Encounter (Signed)
Never received clearance form but I called Dr. Leland Her office to follow-up. Patient was just scheduled for a routine cleaning today and has already had this done. She took her antibiotic for SBE prophylaxis as instructed. Dr. Leland Her office said she may need a filling in the future. Advised that patient will not need to hold Eliquis for one filling but will need SBE prophylaxis again. Will remove from pre-op pool.

## 2019-07-29 ENCOUNTER — Encounter: Payer: Self-pay | Admitting: Adult Health

## 2019-07-30 ENCOUNTER — Other Ambulatory Visit: Payer: Self-pay | Admitting: Family Medicine

## 2019-07-30 DIAGNOSIS — R350 Frequency of micturition: Secondary | ICD-10-CM

## 2019-07-30 DIAGNOSIS — R300 Dysuria: Secondary | ICD-10-CM

## 2019-07-31 ENCOUNTER — Encounter: Payer: Self-pay | Admitting: Adult Health

## 2019-07-31 ENCOUNTER — Other Ambulatory Visit (INDEPENDENT_AMBULATORY_CARE_PROVIDER_SITE_OTHER): Payer: Medicare Other

## 2019-07-31 ENCOUNTER — Other Ambulatory Visit: Payer: Self-pay

## 2019-07-31 ENCOUNTER — Telehealth (INDEPENDENT_AMBULATORY_CARE_PROVIDER_SITE_OTHER): Payer: Medicare Other | Admitting: Adult Health

## 2019-07-31 VITALS — Wt 192.0 lb

## 2019-07-31 DIAGNOSIS — I10 Essential (primary) hypertension: Secondary | ICD-10-CM

## 2019-07-31 DIAGNOSIS — I2510 Atherosclerotic heart disease of native coronary artery without angina pectoris: Secondary | ICD-10-CM | POA: Diagnosis not present

## 2019-07-31 DIAGNOSIS — R300 Dysuria: Secondary | ICD-10-CM | POA: Diagnosis not present

## 2019-07-31 DIAGNOSIS — R8290 Unspecified abnormal findings in urine: Secondary | ICD-10-CM

## 2019-07-31 DIAGNOSIS — R350 Frequency of micturition: Secondary | ICD-10-CM

## 2019-07-31 DIAGNOSIS — R82998 Other abnormal findings in urine: Secondary | ICD-10-CM | POA: Diagnosis not present

## 2019-07-31 DIAGNOSIS — N3000 Acute cystitis without hematuria: Secondary | ICD-10-CM

## 2019-07-31 LAB — POCT URINALYSIS DIPSTICK
Glucose, UA: POSITIVE — AB
Protein, UA: NEGATIVE
Spec Grav, UA: 1.015 (ref 1.010–1.025)
Urobilinogen, UA: 0.2 E.U./dL
pH, UA: 6 (ref 5.0–8.0)

## 2019-07-31 MED ORDER — CIPROFLOXACIN HCL 500 MG PO TABS: 500.0000 mg | ORAL_TABLET | Freq: Two times a day (BID) | ORAL | 0 refills | Status: DC

## 2019-07-31 NOTE — Progress Notes (Signed)
Virtual Visit via Video Note  I connected with Breanna Webster  on 07/31/19 at  4:00 PM EDT by a video enabled telemedicine application and verified that I am speaking with the correct person using two identifiers.  Location patient: home Location provider:work or home office Persons participating in the virtual visit: patient, provider  I discussed the limitations of evaluation and management by telemedicine and the availability of in person appointments. The patient expressed understanding and agreed to proceed.   HPI: 74 year old female who is being evaluated today for concern of UTI.  She reports that her symptoms include 1 week ago urgent urinary urgency/frequency and cloudy urine.  Symptoms are similar to that of her UTI 2 months ago.  She denies dysuria, hematuria, or pelvic pain.  She has not experienced any fevers or chills.  Her urinalysis was positive for leukocytes and glucose   ROS: See pertinent positives and negatives per HPI.  Past Medical History:  Diagnosis Date  . Anxiety   . Arthritis    "back" (04/22/2018)  . Blood transfusion without reported diagnosis   . CAD (coronary artery disease)    a. 03/2018 s/p PCI/DES to the RCA (3.0x15 Onyx DES).  . Carotid artery stenosis    Mild  . Chronic lower back pain   . Cirrhosis ([place])   . Colon polyps   . Diverticulitis   . Diverticulosis   . Esophageal thickening    seen on pre TAVR CT scan, also questionable cirrhosis. MRI recommended. Will refer to GI  . GERD (gastroesophageal reflux disease)   . Grave's disease   . History of colonic polyps 05/22/2017  . History of hiatal hernia   . Hypertension   . Hypothyroidism   . IBS (irritable bowel syndrome)   . Osteopenia   . Pulmonary nodules    seen on pre TAVR CT. likley benign. no follow up recommended if pt low risk.  . S/P TAVR (transcatheter aortic valve replacement)   . Severe aortic stenosis   . Stroke ([place])   . Thalassemia minor   . Type II diabetes  mellitus ([place])     Past Surgical History:  Procedure Laterality Date  . [place] STUDY N/A 03/03/2018   Procedure: [place] STUDY;  Surgeon: Mauri Pole, MD;  Location: [facility];  Service: Endoscopy;  Laterality: N/A;  . COLONOSCOPY    . COLONOSCOPY W/ BIOPSIES AND POLYPECTOMY    . CORONARY ANGIOGRAPHY Right 04/21/2018   Procedure: CORONARY ANGIOGRAPHY (CATH LAB);  Surgeon: [name], MD;  Location: [place] CV LAB;  Service: Cardiovascular;  Laterality: Right;  . CORONARY STENT INTERVENTION N/A 04/22/2018   Procedure: CORONARY STENT INTERVENTION;  Surgeon: [name], MD;  Location: [place] CV LAB;  Service: Cardiovascular;  Laterality: N/A;  . DILATION AND CURETTAGE OF UTERUS    . ESOPHAGEAL MANOMETRY N/A 03/03/2018   Procedure: ESOPHAGEAL MANOMETRY (EM);  Surgeon: Mauri Pole, MD;  Location: [facility];  Service: Endoscopy;  Laterality: N/A;  . GASTRIC FUNDOPLICATION    . HERNIA REPAIR    . HYSTEROSCOPY     fibroids  . LAPAROSCOPIC CHOLECYSTECTOMY    . LAPAROSCOPY     fibroids  . NISSEN FUNDOPLICATION  3536R  . POLYPECTOMY    . RIGHT/LEFT HEART CATH AND CORONARY ANGIOGRAPHY N/A 02/20/2018   Procedure: RIGHT/LEFT HEART CATH AND CORONARY ANGIOGRAPHY;  Surgeon: [name], MD;  Location: [place] CV LAB;  Service: Cardiovascular;  Laterality: N/A;  .  TEE WITHOUT CARDIOVERSION N/A 07/08/2018   Procedure: TRANSESOPHAGEAL ECHOCARDIOGRAM (TEE);  Surgeon: Burnell Blanks, MD;  Location: [place];  Service: Open Heart Surgery;  Laterality: N/A;  . TEE WITHOUT CARDIOVERSION  10/07/2018  . TEE WITHOUT CARDIOVERSION N/A 10/07/2018   Procedure: TRANSESOPHAGEAL ECHOCARDIOGRAM (TEE);  Surgeon: Jerline Pain, MD;  Location: [facility] ENDOSCOPY;  Service: Cardiovascular;  Laterality: N/A;  . TONSILLECTOMY    . TRANSCATHETER AORTIC VALVE REPLACEMENT, TRANSFEMORAL N/A 07/08/2018   Procedure: TRANSCATHETER AORTIC VALVE REPLACEMENT, TRANSFEMORAL;  Surgeon:  Burnell Blanks, MD;  Location: [place];  Service: Open Heart Surgery;  Laterality: N/A;    Family History  Adopted: Yes  Problem Relation Age of Onset  . Healthy Son        x 2  . Headache Other        Cluster headaches  . Heart failure Mother   . Colon cancer Neg Hx   . Pancreatic cancer Neg Hx   . Rectal cancer Neg Hx   . Stomach cancer Neg Hx        Current Outpatient Medications:  .  amitriptyline (ELAVIL) 25 MG tablet, TAKE 1 TABLET(25 MG) BY MOUTH AT BEDTIME, Disp: 90 tablet, Rfl: 1 .  BD INSULIN SYRINGE U/F 31G X 5/16" 1 ML MISC, USE TO INJECT INSULIN EVERY DAY, Disp: 100 each, Rfl: 3 .  cholecalciferol (VITAMIN D) 1000 units tablet, Take 1,000 Units by mouth daily., Disp: , Rfl:  .  diltiazem (CARDIZEM CD) 120 MG 24 hr capsule, TAKE 1 CAPSULE(120 MG) BY MOUTH DAILY, Disp: 90 capsule, Rfl: 1 .  ELIQUIS 5 MG TABS tablet, TAKE 1 TABLET BY MOUTH TWICE DAILY, Disp: 180 tablet, Rfl: 1 .  escitalopram (LEXAPRO) 20 MG tablet, TAKE 1 TABLET(20 MG) BY MOUTH DAILY, Disp: 90 tablet, Rfl: 1 .  Lancets (ONETOUCH DELICA PLUS [id number]) MISC, USE TO CHECK BLOOD SUGAR TWICE DAILY.., Disp: 200 each, Rfl: 0 .  losartan (COZAAR) 50 MG tablet, TAKE 1 TABLET(50 MG) BY MOUTH DAILY, Disp: 90 tablet, Rfl: 1 .  metFORMIN (GLUCOPHAGE) 500 MG tablet, Take 500 mg by mouth 2 (two) times daily with a meal. , Disp: , Rfl:  .  Multiple Vitamin (MULITIVITAMIN WITH MINERALS) TABS, Take 1 tablet by mouth daily., Disp: , Rfl:  .  NOVOLIN R 100 UNIT/ML injection, INJECT 15 UNITS THREE TIMES DAILY BEFORE MEALS, Disp: , Rfl:  .  ONETOUCH VERIO test strip, USE TO MONITOR GLUCOSE LEVELS TWICE DAILY, Disp: 200 strip, Rfl: 3 .  pantoprazole (PROTONIX) 40 MG tablet, Take 1 tablet (40 mg total) by mouth daily., Disp: 90 tablet, Rfl: 3 .  SYNTHROID 125 MCG tablet, Take 125 mcg by mouth every morning., Disp: , Rfl:   EXAM:  VITALS per patient if applicable:  GENERAL: alert, oriented, appears well and in no  acute distress  HEENT: atraumatic, conjunttiva clear, no obvious abnormalities on inspection of external nose and ears  NECK: normal movements of the head and neck  LUNGS: on inspection no signs of respiratory distress, breathing rate appears normal, no obvious gross SOB, gasping or wheezing  CV: no obvious cyanosis  MS: moves all visible extremities without noticeable abnormality  PSYCH/NEURO: pleasant and cooperative, no obvious depression or anxiety, speech and thought processing grossly intact  ASSESSMENT AND PLAN:  Discussed the following assessment and plan:  1. Acute cystitis without hematuria  - Culture, Urine - ciprofloxacin (CIPRO) 500 MG tablet; Take 1 tablet (500 mg total) by mouth 2 (two) times daily.  Dispense: 20 tablet; Refill: 0     I discussed the assessment and treatment plan with the patient. The patient was provided an opportunity to ask questions and all were answered. The patient agreed with the plan and demonstrated an understanding of the instructions.   The patient was advised to call back or seek an in-person evaluation if the symptoms worsen or if the condition fails to improve as anticipated.   [name], NP

## 2019-08-03 ENCOUNTER — Other Ambulatory Visit: Payer: Self-pay | Admitting: *Deleted

## 2019-08-03 LAB — URINE CULTURE
MICRO NUMBER:: 10346419
SPECIMEN QUALITY:: ADEQUATE

## 2019-08-03 NOTE — Patient Outreach (Signed)
[facility]) Care Management  08/03/2019  Breanna Webster 24-Jan-1946 [id number]   Telephone Assessment  RN attempted outreach call however unsuccessful. RN able to leave a HIPAA approved voice message requesting a call back. Will obtain updated assessment on pt's ongoing management of care at that time.   Will follow up with another outreach call next week.  [name], RN Care Management Coordinator [place] Office [phone]

## 2019-08-04 ENCOUNTER — Other Ambulatory Visit: Payer: Self-pay | Admitting: Adult Health

## 2019-08-04 MED ORDER — AMOXICILLIN-POT CLAVULANATE 875-125 MG PO TABS: 1.0000 | ORAL_TABLET | Freq: Two times a day (BID) | ORAL | 0 refills | Status: DC

## 2019-08-04 NOTE — Progress Notes (Signed)
Order for augmentin for UTI

## 2019-08-05 ENCOUNTER — Other Ambulatory Visit: Payer: Self-pay | Admitting: *Deleted

## 2019-08-10 DIAGNOSIS — Z794 Long term (current) use of insulin: Secondary | ICD-10-CM | POA: Diagnosis not present

## 2019-08-10 DIAGNOSIS — E1165 Type 2 diabetes mellitus with hyperglycemia: Secondary | ICD-10-CM | POA: Diagnosis not present

## 2019-08-10 DIAGNOSIS — E1149 Type 2 diabetes mellitus with other diabetic neurological complication: Secondary | ICD-10-CM | POA: Diagnosis not present

## 2019-08-10 DIAGNOSIS — I2510 Atherosclerotic heart disease of native coronary artery without angina pectoris: Secondary | ICD-10-CM | POA: Diagnosis not present

## 2019-08-10 DIAGNOSIS — I4891 Unspecified atrial fibrillation: Secondary | ICD-10-CM | POA: Diagnosis not present

## 2019-08-10 DIAGNOSIS — E039 Hypothyroidism, unspecified: Secondary | ICD-10-CM | POA: Diagnosis not present

## 2019-08-10 DIAGNOSIS — Z952 Presence of prosthetic heart valve: Secondary | ICD-10-CM | POA: Diagnosis not present

## 2019-08-10 DIAGNOSIS — Z8673 Personal history of transient ischemic attack (TIA), and cerebral infarction without residual deficits: Secondary | ICD-10-CM | POA: Diagnosis not present

## 2019-08-10 DIAGNOSIS — Z6831 Body mass index (BMI) 31.0-31.9, adult: Secondary | ICD-10-CM | POA: Diagnosis not present

## 2019-08-11 ENCOUNTER — Encounter (INDEPENDENT_AMBULATORY_CARE_PROVIDER_SITE_OTHER): Payer: Self-pay

## 2019-08-11 ENCOUNTER — Other Ambulatory Visit: Payer: Self-pay | Admitting: *Deleted

## 2019-08-11 NOTE — Patient Outreach (Signed)
[facility]) Care Management  08/11/2019  BELLAMI FARRELLY [birth year] [id number]  Telephone Assessment-Successful  RN spoke with consented spouse [name]) who updated RN case management on pt's ongoing management of care. States pt is doing very good with managing her ADL, ambulating with a steady balance, speech much better overall improving from her stroke symptoms. Reports pt is managing her BS and her medications with with no delays of issues.   Discussed pt's existing plan of care with no admissions or recent ED visits as pt continue to do well with no reported issues. Will continue to reiterated on safety both inside and outside the home. Plan updated with adjusted intervention as caregiver encouraged to assist pt with her ongoing safety.    Plan: Will follow up with plans to possible discharge pt in July if she continues to do well.  [facility] CM Care Plan Problem One     Most Recent Value  Care Plan Problem One  Recent hospitalization  Role Documenting the Problem One  Care Management [place] for Problem One  Active  [facility] Long Term Goal   Pt will not have readmission over the next 90 days related to stroke.  [facility] Long Term Goal Start Date  10/28/18  Interventions for Problem One Long Term Goal  Will verify pt continue to be safe with no acute issues and no admission or ED visits. Will continue to encouraged ongoing adherence with use of available DME and community resources.      [name], RN Care Management Coordinator [place] Office [phone]

## 2019-08-13 NOTE — Progress Notes (Signed)
HEMATOLOGY/ONCOLOGY CONSULTATION NOTE  Date of Service: 08/14/2019  Patient Care Team: [name], NP as PCP - General (Family Medicine) [name], MD as PCP - Cardiology (Cardiology) Tobi Bastos, RN as [place] Management Madelin Rear, MD as Consulting Physician (Endocrinology)  REFERRING PHYSICIAN: [name], NP  CHIEF COMPLAINTS/PURPOSE OF CONSULTATION:   Thalassemia minor IDA   HISTORY OF PRESENTING ILLNESS:   Breanna Webster is a wonderful 74 y.o. female who has been referred to [name] by [name], NP for evaluation and management of Thalassemia minor. She is accompanied today by her husband. The pt reports that she is doing well overall.   The pt reports On 04/22/2018 she had Cardiac catheterization. The   07/08/2018- Infection around the heart, endocarditis.  Mitral valve infections.  She had a stroke in April 2020.   She gets shortness of breath when walking more than 50 steps. Before April 2020 she was very active. She is also having difficulty with balance.  Doctors are unsure as to why she is having difficulty breathing.  She has had radiation treatment to half of her thyroid.  She has not noticed any improvement since her valve placement  She has never had IV transfusions   She use to take B12 shots in her 61s. She is unsure why they were stopped. Possibly stopped because tests showed she no longer needed B12.   She reports that she was diagnosed with a fatty liver.  She is taking synthroid 125 MCG and eliquis.    Most recent lab results (04/28/2018) of CBC is as follows: all values are WNL except for RBC at 5.44, Hemoglobin at 9.0, Hct at 29.8, MCV at 54.8, RDW at 19.8, Monocytes Relative 2.0, ALT at 39, INR at 1.6, Prothrombin Time at 18.4.  On review of systems, pt reports shortness of breath when walking or being active, fatigue, steady weight, and denies chest pain, leg swelling and any other symptoms.    On PMHx the pt reports liver cirrhosis, diagnosed with Thalassemia minor 20 years ago. Diabetes.   On Social Hx the pt reports never smoked. Never consumed alcohol.   On Family Hx the pt reports son has Thalassemia minor.  INTERVAL HISTORY:  Breanna Webster is a 74 y.o. female here for evaluation and management of Thalassemia minor. The patient's last visit with [name] was on 05/15/19. The pt reports that she is doing well overall.  The pt reports she has been fatigued. Previously the pt was a very active person and she is having trouble being active. She is currently taking a multi-vitamin and a Vitamin D3. Pt has also started taking rybelsus.   Lab results today (08/14/19) of CBC w/diff and CMP is as follows: all values are WNL except for RBC at 5.74, Hemoglobin at 11.3, MCV at 64.5, MCH at 19.7, RDW at 18.9, Glucose at 146, Calcium at 10.6, AST at 48, ALT at 60 08/14/19 of Ferritin at 327 08/14/19 of Iron and TIBC is as follows: all values are WNL  On review of systems, pt reports fatigue, healthy appetite and any other symptoms.   MEDICAL HISTORY:  Past Medical History:  Diagnosis Date  . Anxiety   . Arthritis    "back" (04/22/2018)  . Blood transfusion without reported diagnosis   . CAD (coronary artery disease)    a. 03/2018 s/p PCI/DES to the RCA (3.0x15 Onyx DES).  . Carotid artery stenosis    Mild  . Chronic lower  back pain   . Cirrhosis ([place])   . Colon polyps   . Diverticulitis   . Diverticulosis   . Esophageal thickening    seen on pre TAVR CT scan, also questionable cirrhosis. MRI recommended. Will refer to GI  . GERD (gastroesophageal reflux disease)   . Grave's disease   . History of colonic polyps 05/22/2017  . History of hiatal hernia   . Hypertension   . Hypothyroidism   . IBS (irritable bowel syndrome)   . Osteopenia   . Pulmonary nodules    seen on pre TAVR CT. likley benign. no follow up recommended if pt low risk.  . S/P TAVR (transcatheter aortic  valve replacement)   . Severe aortic stenosis   . Stroke ([place])   . Thalassemia minor   . Type II diabetes mellitus ([place])      SURGICAL HISTORY: Past Surgical History:  Procedure Laterality Date  . [place] STUDY N/A 03/03/2018   Procedure: [place] STUDY;  Surgeon: Mauri Pole, MD;  Location: [facility];  Service: Endoscopy;  Laterality: N/A;  . COLONOSCOPY    . COLONOSCOPY W/ BIOPSIES AND POLYPECTOMY    . CORONARY ANGIOGRAPHY Right 04/21/2018   Procedure: CORONARY ANGIOGRAPHY (CATH LAB);  Surgeon: [name], MD;  Location: [facility];  Service: Cardiovascular;  Laterality: Right;  . CORONARY STENT INTERVENTION N/A 04/22/2018   Procedure: CORONARY STENT INTERVENTION;  Surgeon: [name], MD;  Location: [place] [facility] LAB;  Service: Cardiovascular;  Laterality: N/A;  . DILATION AND CURETTAGE OF UTERUS    . ESOPHAGEAL MANOMETRY N/A 03/03/2018   Procedure: ESOPHAGEAL MANOMETRY (EM);  Surgeon: Mauri Pole, MD;  Location: [facility];  Service: Endoscopy;  Laterality: N/A;  . GASTRIC FUNDOPLICATION    . HERNIA REPAIR    . HYSTEROSCOPY     fibroids  . LAPAROSCOPIC CHOLECYSTECTOMY    . LAPAROSCOPY     fibroids  . NISSEN FUNDOPLICATION  [id number]  . POLYPECTOMY    . RIGHT/LEFT HEART CATH AND CORONARY ANGIOGRAPHY N/A 02/20/2018   Procedure: RIGHT/LEFT HEART CATH AND CORONARY ANGIOGRAPHY;  Surgeon: [name], MD;  Location: [place] [facility];  Service: Cardiovascular;  Laterality: N/A;  . TEE WITHOUT CARDIOVERSION N/A 07/08/2018   Procedure: TRANSESOPHAGEAL ECHOCARDIOGRAM (TEE);  Surgeon: Burnell Blanks, MD;  Location: [facility] [place];  Service: Open Heart Surgery;  Laterality: N/A;  . TEE WITHOUT CARDIOVERSION  10/07/2018  . TEE WITHOUT CARDIOVERSION N/A 10/07/2018   Procedure: TRANSESOPHAGEAL ECHOCARDIOGRAM (TEE);  Surgeon: Jerline Pain, MD;  Location[facility] END[facility];  Service: Cardiovascular;  Laterality: N/A;  . TONSILLECTOMY    . TRANSCATHETER  AORTIC VALVE REPLACEMENT, TRANSFEMORAL N/A 07/08/2018   Procedure: TRANSCATHETER AORTIC VALVE REPLACEMENT, TRANSFEMORAL;  Surgeon: Burnell Blanks, MD;  Location: [place];  Service: Open Heart Surgery;  Laterality: N/A;     SOCIAL HISTORY: Social History   Socioeconomic History  . Marital status: Married    Spouse name: Not on file  . Number of children: 2  . Years of education: Not on file  . Highest education level: Not on file  Occupational History  . Occupation: Tree surgeon of the [employer]  Tobacco Use  . Smoking status: Never Smoker  . Smokeless tobacco: Never Used  Substance and Sexual Activity  . Alcohol use: No    Alcohol/week: 0.0 standard drinks  . Drug use: Never  . Sexual activity: Not Currently  Other Topics Concern  . Not  on file  Social History Narrative   Lives with husband in a one story home.     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   Education: college.   Social Determinants of Health   Financial Resource Strain: Low Risk   . Difficulty of Paying Living Expenses: Not hard at all  Food Insecurity:   . Worried About Charity fundraiser in the Last Year:   . Arboriculturist in the Last Year:   Transportation Needs: No Transportation Needs  . Lack of Transportation (Medical): No  . Lack of Transportation (Non-Medical): No  Physical Activity:   . Days of Exercise per Week:   . Minutes of Exercise per Session:   Stress: Stress Concern Present  . Feeling of Stress : To some extent  Social Connections:   . Frequency of Communication with Friends and Family:   . Frequency of Social Gatherings with Friends and Family:   . Attends Religious Services:   . Active Member of Clubs or Organizations:   . Attends Archivist Meetings:   Marland Kitchen Marital Status:   Intimate Partner Violence:   . Fear of Current or Ex-Partner:   . Emotionally Abused:   Marland Kitchen Physically  Abused:   . Sexually Abused:      FAMILY HISTORY: Family History  Adopted: Yes  Problem Relation Age of Onset  . Healthy Son        x 2  . Headache Other        Cluster headaches  . Heart failure Mother   . Colon cancer Neg Hx   . Pancreatic cancer Neg Hx   . Rectal cancer Neg Hx   . Stomach cancer Neg Hx    ALLERGIES:   is allergic to statins.   MEDICATIONS:  Current Outpatient Medications  Medication Sig Dispense Refill  . amitriptyline (ELAVIL) 25 MG tablet TAKE 1 TABLET(25 MG) BY MOUTH AT BEDTIME 90 tablet 1  . amoxicillin-clavulanate (AUGMENTIN) 875-125 MG tablet Take 1 tablet by mouth 2 (two) times daily. 10 tablet 0  . BD INSULIN SYRINGE U/F 31G X 5/16" 1 ML MISC USE TO INJECT INSULIN EVERY DAY 100 each 3  . cholecalciferol (VITAMIN D) 1000 units tablet Take 1,000 Units by mouth daily.    . ciprofloxacin (CIPRO) 500 MG tablet Take 1 tablet (500 mg total) by mouth 2 (two) times daily. 20 tablet 0  . diltiazem (CARDIZEM CD) 120 MG 24 hr capsule TAKE 1 CAPSULE(120 MG) BY MOUTH DAILY 90 capsule 1  . ELIQUIS 5 MG TABS tablet TAKE 1 TABLET BY MOUTH TWICE DAILY 180 tablet 1  . escitalopram (LEXAPRO) 20 MG tablet TAKE 1 TABLET(20 MG) BY MOUTH DAILY 90 tablet 1  . Lancets (ONETOUCH DELICA PLUS [id number]) MISC USE TO CHECK BLOOD SUGAR TWICE DAILY.. 200 each 0  . losartan (COZAAR) 50 MG tablet TAKE 1 TABLET(50 MG) BY MOUTH DAILY 90 tablet 1  . metFORMIN (GLUCOPHAGE) 500 MG tablet Take 500 mg by mouth 2 (two) times daily with a meal.     . Multiple Vitamin (MULITIVITAMIN WITH MINERALS) TABS Take 1 tablet by mouth daily.    Marland Kitchen NOVOLIN R 100 UNIT/ML injection INJECT 15 UNITS THREE TIMES DAILY BEFORE MEALS    . ONETOUCH VERIO test strip USE TO MONITOR GLUCOSE LEVELS TWICE DAILY 200 strip 3  . pantoprazole (PROTONIX) 40 MG tablet Take 1 tablet (40 mg total) by mouth  daily. 90 tablet 3  . SYNTHROID 125 MCG tablet Take 125 mcg by mouth every morning.     No current  facility-administered medications for this visit.     REVIEW OF SYSTEMS:   A 10+ POINT REVIEW OF SYSTEMS WAS OBTAINED including neurology, dermatology, psychiatry, cardiac, respiratory, lymph, extremities, GI, GU, Musculoskeletal, constitutional, breasts, reproductive, HEENT.  All pertinent positives are noted in the HPI.  All others are negative.   PHYSICAL EXAMINATION: Vitals:   08/14/19 1054 08/14/19 1055  BP: (!) 148/71 136/64  Pulse: 74   Resp: 18   Temp: 97.8 F (36.6 C)   SpO2: 97%    Wt Readings from Last 3 Encounters:  08/14/19 191 lb 1.6 oz (86.7 kg)  07/31/19 192 lb (87.1 kg)  07/01/19 195 lb 1.9 oz (88.5 kg)   Body mass index is 31.8 kg/m.    Exam given in chair   GENERAL:alert, in no acute distress and comfortable SKIN: no acute rashes, no significant lesions EYES: conjunctiva are pink and non-injected, sclera anicteric OROPHARYNX: MMM, no exudates, no oropharyngeal erythema or ulceration NECK: supple, no JVD LYMPH:  no palpable lymphadenopathy in the cervical, axillary or inguinal regions LUNGS: clear to auscultation b/l with normal respiratory effort HEART: regular rate & rhythm ABDOMEN:  normoactive bowel sounds , non tender, not distended. Extremity: no pedal edema PSYCH: alert & oriented x 3 with fluent speech NEURO: no focal motor/sensory deficits  LABORATORY DATA:  I have reviewed the data as listed  CBC Latest Ref Rng & Units 08/14/2019 04/30/2019 04/30/2019  WBC 4.0 - 10.5 K/uL 7.8 7.0 -  Hemoglobin 12.0 - 15.0 g/dL 11.3(L) 8.9(L) -  Hematocrit 36.0 - 46.0 % 37.0 31.6(L) 30.4(L)  Platelets 150 - 400 K/uL 193 208 -   . CBC    Component Value Date/Time   WBC 7.8 08/14/2019 1025   RBC 5.74 (H) 08/14/2019 1025   HGB 11.3 (L) 08/14/2019 1025   HGB 11.0 (L) 11/12/2018 0939   HCT 37.0 08/14/2019 1025   HCT 31.6 (L) 04/30/2019 1149   PLT 193 08/14/2019 1025   PLT 238 11/12/2018 0939   MCV 64.5 (L) 08/14/2019 1025   MCV 65 (L) 11/12/2018 0939    MCH 19.7 (L) 08/14/2019 1025   MCHC 30.5 08/14/2019 1025   RDW 18.9 (H) 08/14/2019 1025   RDW 18.2 (H) 11/12/2018 0939   LYMPHSABS 1.6 08/14/2019 1025   LYMPHSABS 1.2 02/26/2017 1054   MONOABS 0.5 08/14/2019 1025   EOSABS 0.2 08/14/2019 1025   EOSABS 0.1 02/26/2017 1054   BASOSABS 0.1 08/14/2019 1025   BASOSABS 0.0 02/26/2017 1054   . CMP Latest Ref Rng & Units 08/14/2019 05/11/2019 04/30/2019  Glucose 70 - 99 mg/dL 146(H) - 163(H)  BUN 8 - 23 mg/dL 17 18 22   Creatinine 0.44 - 1.00 mg/dL 0.90 1.0 0.90  Sodium 135 - 145 mmol/L 141 139 139  Potassium 3.5 - 5.1 mmol/L 4.3 4.2 4.0  Chloride 98 - 111 mmol/L 103 99 103  CO2 22 - 32 mmol/L 25 31(A) 24  Calcium 8.9 - 10.3 mg/dL 10.6(H) 10.3 10.2  Total Protein 6.5 - 8.1 g/dL 7.6 - 7.7  Total Bilirubin 0.3 - 1.2 mg/dL 1.1 - 0.5  Alkaline Phos 38 - 126 U/L 86 115 94  AST 15 - 41 U/L 48(H) 60(A) 53(H)  ALT 0 - 44 U/L 60(H) 69(A) 50(H)   . Lab Results  Component Value Date   IRON 104 08/14/2019   TIBC 305 08/14/2019  IRONPCTSAT 34 08/14/2019   (Iron and TIBC)  Lab Results  Component Value Date   FERRITIN 327 (H) 08/14/2019   Component     Latest Ref Rng & Units 04/30/2019  IgG (Immunoglobin G), Serum     586 - 1,602 mg/dL 1,299  IgA     64 - 422 mg/dL 342  IgM (Immunoglobulin M), Srm     26 - 217 mg/dL 108  Total Protein ELP     6.0 - 8.5 g/dL 7.0  Albumin SerPl Elph-Mcnc     2.9 - 4.4 g/dL 3.5  Alpha 1     0.0 - 0.4 g/dL 0.2  Alpha2 Glob SerPl Elph-Mcnc     0.4 - 1.0 g/dL 0.7  B-Globulin SerPl Elph-Mcnc     0.7 - 1.3 g/dL 1.3  Gamma Glob SerPl Elph-Mcnc     0.4 - 1.8 g/dL 1.3  M Protein SerPl Elph-Mcnc     Not Observed g/dL Not Observed  Globulin, Total     2.2 - 3.9 g/dL 3.5  Albumin/Glob SerPl     0.7 - 1.7 1.1  IFE 1      Comment  Please Note (HCV):      Comment  Hgb A2 Quant     1.8 - 3.2 % 4.1 (H)  Hgb F Quant     0.0 - 2.0 % 0.0  Hgb S Quant     0.0 % 0.0  HGB C     0.0 % 0.0  Hgb A     96.4 -  98.8 % 95.9 (L)  HGB VARIANT     0.0 % 0.0  Please Note:      Comment  Iron     41 - 142 ug/dL 27 (L)  TIBC     236 - 444 ug/dL 485 (H)  Saturation Ratios     21 - 57 % 6 (L)  UIBC     120 - 384 ug/dL 458 (H)  Folate, Hemolysate     Not Estab. ng/mL 463.0  HCT     34.0 - 46.6 % 31.6 (L)  Folate, RBC     >498 ng/mL 1,465  Haptoglobin     42 - 346 mg/dL 65  LDH     98 - 192 U/L 194 (H)  T4,Free(Direct)     0.61 - 1.12 ng/dL 0.89  TSH     0.308 - 3.960 uIU/mL 5.935 (H)  Vitamin B12     180 - 914 pg/mL 575  Ferritin     11 - 307 ng/mL 10 (L)    RADIOGRAPHIC STUDIES: I have personally reviewed the radiological images as listed and agreed with the findings in the report. No results found.   ASSESSMENT & PLAN:   Breanna Webster is a 74 y.o. female with:  1. Moderate Microcytic Anemia 2. H/o Beta thalassemia minor with baseline hgb likely in the 10-11 range 3. Iron deficiency   PLAN: -Discussed pt labwork today, 08/14/19; of CBC w/diff and CMP is as follows: all values are WNL except for RBC at 5.74, Hemoglobin at 11.3, MCV at 64.5, MCH at 19.7, RDW at 18.9, Glucose at 146, Calcium at 10.6, AST at 48, ALT at 60 -Discussed 08/14/19 of Ferritin at 327 -Discussed 08/14/19 of Iron and TIBC is as follows: all values are WNL -Advised anemia is at baseline -Advised on fatigue - iron deficiency and anemia being corrected  -Recommends f/u with PCP for fatigue  -Recommends continue taking multi-vitamin and Vitamin  D3 -Goal Ferritin >50   -Will see back in 6 months   FOLLOW UP: RTC with Dr Irene Limbo with labs in 6 months  The total time spent in the appt was 20 minutes and more than 50% was on counseling and direct patient cares.  All of the patient's questions were answered with apparent satisfaction. The patient knows to call the clinic with any problems, questions or concerns.  Sullivan Lone MD MS AAHIVMS [facility] Hematology/Oncology Physician [facility]  (Office):       [phone] (Work cell):  [phone] (Fax):           [phone]  08/14/2019 1:26 PM   I, Dawayne Cirri am acting as a Education administrator for Dr. Sullivan Lone.   .I have reviewed the above documentation for accuracy and completeness, and I agree with the above. [name] MD

## 2019-08-14 ENCOUNTER — Inpatient Hospital Stay: Payer: Medicare Other

## 2019-08-14 ENCOUNTER — Other Ambulatory Visit: Payer: Self-pay

## 2019-08-14 ENCOUNTER — Inpatient Hospital Stay: Payer: Medicare Other | Attending: Hematology | Admitting: Hematology

## 2019-08-14 VITALS — BP 136/64 | HR 74 | Temp 97.8°F | Resp 18 | Ht 65.0 in | Wt 191.1 lb

## 2019-08-14 DIAGNOSIS — D509 Iron deficiency anemia, unspecified: Secondary | ICD-10-CM | POA: Insufficient documentation

## 2019-08-14 DIAGNOSIS — I2510 Atherosclerotic heart disease of native coronary artery without angina pectoris: Secondary | ICD-10-CM

## 2019-08-14 DIAGNOSIS — K7460 Unspecified cirrhosis of liver: Secondary | ICD-10-CM | POA: Insufficient documentation

## 2019-08-14 DIAGNOSIS — D563 Thalassemia minor: Secondary | ICD-10-CM | POA: Insufficient documentation

## 2019-08-14 DIAGNOSIS — D649 Anemia, unspecified: Secondary | ICD-10-CM

## 2019-08-14 DIAGNOSIS — E119 Type 2 diabetes mellitus without complications: Secondary | ICD-10-CM | POA: Diagnosis not present

## 2019-08-14 DIAGNOSIS — E039 Hypothyroidism, unspecified: Secondary | ICD-10-CM | POA: Insufficient documentation

## 2019-08-14 LAB — CBC WITH DIFFERENTIAL/PLATELET
Abs Immature Granulocytes: 0.02 10*3/uL (ref 0.00–0.07)
Basophils Absolute: 0.1 10*3/uL (ref 0.0–0.1)
Basophils Relative: 1 %
Eosinophils Absolute: 0.2 10*3/uL (ref 0.0–0.5)
Eosinophils Relative: 3 %
HCT: 37 % (ref 36.0–46.0)
Hemoglobin: 11.3 g/dL — ABNORMAL LOW (ref 12.0–15.0)
Immature Granulocytes: 0 %
Lymphocytes Relative: 20 %
Lymphs Abs: 1.6 10*3/uL (ref 0.7–4.0)
MCH: 19.7 pg — ABNORMAL LOW (ref 26.0–34.0)
MCHC: 30.5 g/dL (ref 30.0–36.0)
MCV: 64.5 fL — ABNORMAL LOW (ref 80.0–100.0)
Monocytes Absolute: 0.5 10*3/uL (ref 0.1–1.0)
Monocytes Relative: 6 %
Neutro Abs: 5.5 10*3/uL (ref 1.7–7.7)
Neutrophils Relative %: 70 %
Platelets: 193 10*3/uL (ref 150–400)
RBC: 5.74 MIL/uL — ABNORMAL HIGH (ref 3.87–5.11)
RDW: 18.9 % — ABNORMAL HIGH (ref 11.5–15.5)
WBC: 7.8 10*3/uL (ref 4.0–10.5)
nRBC: 0 % (ref 0.0–0.2)

## 2019-08-14 LAB — CMP (CANCER CENTER ONLY)
ALT: 60 U/L — ABNORMAL HIGH (ref 0–44)
AST: 48 U/L — ABNORMAL HIGH (ref 15–41)
Albumin: 4.1 g/dL (ref 3.5–5.0)
Alkaline Phosphatase: 86 U/L (ref 38–126)
Anion gap: 13 (ref 5–15)
BUN: 17 mg/dL (ref 8–23)
CO2: 25 mmol/L (ref 22–32)
Calcium: 10.6 mg/dL — ABNORMAL HIGH (ref 8.9–10.3)
Chloride: 103 mmol/L (ref 98–111)
Creatinine: 0.9 mg/dL (ref 0.44–1.00)
GFR, Est AFR Am: >60 mL/min (ref >60)
GFR, Estimated: >60 mL/min (ref >60)
Glucose, Bld: 146 mg/dL — ABNORMAL HIGH (ref 70–99)
Potassium: 4.3 mmol/L (ref 3.5–5.1)
Sodium: 141 mmol/L (ref 135–145)
Total Bilirubin: 1.1 mg/dL (ref 0.3–1.2)
Total Protein: 7.6 g/dL (ref 6.5–8.1)

## 2019-08-14 LAB — IRON AND TIBC
Iron: 104 ug/dL (ref 41–142)
Saturation Ratios: 34 % (ref 21–57)
TIBC: 305 ug/dL (ref 236–444)
UIBC: 201 ug/dL (ref 120–384)

## 2019-08-14 LAB — FERRITIN: Ferritin: 327 ng/mL — ABNORMAL HIGH (ref 11–307)

## 2019-08-18 ENCOUNTER — Encounter: Payer: Self-pay | Admitting: Gastroenterology

## 2019-08-18 ENCOUNTER — Ambulatory Visit (INDEPENDENT_AMBULATORY_CARE_PROVIDER_SITE_OTHER): Payer: Medicare Other | Admitting: Gastroenterology

## 2019-08-18 VITALS — BP 100/60 | HR 72 | Temp 96.1°F | Ht 65.0 in | Wt 186.8 lb

## 2019-08-18 DIAGNOSIS — R159 Full incontinence of feces: Secondary | ICD-10-CM | POA: Diagnosis not present

## 2019-08-18 DIAGNOSIS — D508 Other iron deficiency anemias: Secondary | ICD-10-CM | POA: Diagnosis not present

## 2019-08-18 DIAGNOSIS — I2510 Atherosclerotic heart disease of native coronary artery without angina pectoris: Secondary | ICD-10-CM | POA: Diagnosis not present

## 2019-08-18 DIAGNOSIS — K7460 Unspecified cirrhosis of liver: Secondary | ICD-10-CM | POA: Diagnosis not present

## 2019-08-18 MED ORDER — CITRUCEL PO POWD
1.0000 | Freq: Every day | ORAL | Status: DC
Start: 2019-08-18 — End: 2019-09-11

## 2019-08-18 MED ORDER — FERROUS SULFATE 325 (65 FE) MG PO TABS: 325.0000 mg | ORAL_TABLET | Freq: Every day | ORAL | 1 refills | Status: DC

## 2019-08-18 NOTE — Progress Notes (Signed)
HPI :  74 y/o female here for a follow up visit - I have followed her in the past for cirrhosis related to fatty liver, short segment BE, history of colon polyps. In the recent years she has had a CVA, underwent TAVR.  Regarding her liver disease, she is thought to have cirrhosis related to fatty liver.  She has been compensated over time. She underwent a serologic evaluation in 2017 which was negative.  She does not drink any alcohol.  She denies any family history of cirrhosis.  She has had no issues with ascites, jaundice, encephalopathy, nor varices.  She was immunized to hepatitis A and B in 2017.  She last had imaging of her abdomen with an ultrasound in February, liver was cirrhotic without focal mass, otherwise no concerning pathology otherwise.  No ascites.  She denies any jaundice.  She had an EGD this past October and there is no evidence of varices. She has had labs last week which showed an ALT of 60, AST 48, alk phos normal, total bili 1.1.  INR of 1.6 in January.  AFP normal.  She has had anemia in recent months.  Hemoglobin nadired to 8.9 in January with an MCV of 55.  At that time she had a ferritin of 10 and iron studies showed a level of iron of 27, iron sat of 6%.  This was consistent with iron deficiency.  She also notes she has thalassemia minor trait.  She has been on Eliquis for history of stroke/TAVR.  She denies any blood in her stools.  She passes normal brown stool.  She states she has not been on oral iron but was on IV iron x2 doses.  Her hemoglobin was repeated on April 23 in had risen to 11.3, MCV of 64.  Ferritin had risen to 327 and her iron panel otherwise normalized. CT abdomen / pelvis 10/04/18 - normal small bowel  Her last endoscopy was done in October 2020.  She had a very short segment of nondysplastic Barrett's esophagus.  No esophageal or gastric varices were noted.  She takes Protonix 40 mg once a day and this works quite well to control her reflux at baseline.   She otherwise had 28 adenomas removed on colonoscopy in 2018. She had a follow-up colonoscopy with me in October 2020, she had an additional 8 small adenomas removed, it was recommended she have a repeat colonoscopy in 3 years. She saw Dr. Irene Limbo of Hematology on 4/23.   She otherwise complains of having some loose stools in recent days in the setting of trying a new medication for her diabetes, Rybelsus.  Again no blood in the stool, but stool frequency has definitely increased on the regimen and she is not sure if she can tolerate this long-term.  She is also had ongoing urinary incontinence since 2020.  She asks about how to treat both of these.  She has had some leakage of feces with the loose stools and urgency.    Endoscopic history: EGD 02/03/19 - irregular zline, biopsies c/w nondysplastic BE, loose nissen, no varices, benign duodenal polyp Colonoscopy 02/03/19 - 8 small adenomas, diverticulosis, lipoma EGD 03/20/2017 - irregular z line, LA grade B esophagitis, small paraesophageal hernia, loose Nissen wrap, mild gastritis, duodenal erosions - path c/w Barrett's, H pylori negative  Colonoscopy 03/20/2017 - pancolonic diverticulosis, 28 polyps removed - EGD 2002 - suspected segment of BE, no BE on biopsies EGD 2006 - esophagitis and gastritis, no evidence of BE Colonoscopy  5/05 - diverticulosis Colonoscopy 4/15 - 4 small adenomas  Prior imaging: [name] 06/17/19 - cirrhotic liver without focal mass [name] 03/16/19 - suggestive of cirrhosis, post chole CT scan 06/17/18 - thickening of the esophagus unchanged from 2013, "finely irregular liver surface, cannot exclude cirrhosis", no mass lesions US abdomen - 06/13/2015 - fatty liver   Past Medical History:  Diagnosis Date  . Anxiety   . Arthritis    "back" (04/22/2018)  . Blood transfusion without reported diagnosis   . CAD (coronary artery disease)    a. 03/2018 s/p PCI/DES to the RCA (3.0x15 Onyx DES).  . Carotid artery stenosis    Mild   . Chronic lower back pain   . Cirrhosis ([place])   . Colon polyps   . Diverticulitis   . Diverticulosis   . Esophageal thickening    seen on pre TAVR CT scan, also questionable cirrhosis. MRI recommended. Will refer to GI  . GERD (gastroesophageal reflux disease)   . Grave's disease   . History of colonic polyps 05/22/2017  . History of hiatal hernia   . Hypertension   . Hypothyroidism   . IBS (irritable bowel syndrome)   . Osteopenia   . Pulmonary nodules    seen on pre TAVR CT. likley benign. no follow up recommended if pt low risk.  . S/P TAVR (transcatheter aortic valve replacement)   . Severe aortic stenosis   . Stroke ([place])   . Thalassemia minor   . Type II diabetes mellitus ([place])      Past Surgical History:  Procedure Laterality Date  . [place] STUDY N/A 03/03/2018   Procedure: [place] STUDY;  Surgeon: Mauri Pole, MD;  Location: [facility];  Service: Endoscopy;  Laterality: N/A;  . COLONOSCOPY    . COLONOSCOPY W/ BIOPSIES AND POLYPECTOMY    . CORONARY ANGIOGRAPHY Right 04/21/2018   Procedure: CORONARY ANGIOGRAPHY (CATH LAB);  Surgeon: [name], MD;  Location: [place] [facility];  Service: Cardiovascular;  Laterality: Right;  . CORONARY STENT INTERVENTION N/A 04/22/2018   Procedure: CORONARY STENT INTERVENTION;  Surgeon: [name], MD;  Location: [place] CV LAB;  Service: Cardiovascular;  Laterality: N/A;  . DILATION AND CURETTAGE OF UTERUS    . ESOPHAGEAL MANOMETRY N/A 03/03/2018   Procedure: ESOPHAGEAL MANOMETRY (EM);  Surgeon: Mauri Pole, MD;  Location: [facility];  Service: Endoscopy;  Laterality: N/A;  . GASTRIC FUNDOPLICATION    . HERNIA REPAIR    . HYSTEROSCOPY     fibroids  . LAPAROSCOPIC CHOLECYSTECTOMY    . LAPAROSCOPY     fibroids  . NISSEN FUNDOPLICATION  8338S  . POLYPECTOMY    . RIGHT/LEFT HEART CATH AND CORONARY ANGIOGRAPHY N/A 02/20/2018   Procedure: RIGHT/LEFT HEART CATH AND CORONARY ANGIOGRAPHY;  Surgeon:  [name], MD;  Location: [place] [facility] LAB;  Service: Cardiovascular;  Laterality: N/A;  . TEE WITHOUT CARDIOVERSION N/A 07/08/2018   Procedure: TRANSESOPHAGEAL ECHOCARDIOGRAM (TEE);  Surgeon: Burnell Blanks, MD;  Location: [place];  Service: Open Heart Surgery;  Laterality: N/A;  . TEE WITHOUT CARDIOVERSION  10/07/2018  . TEE WITHOUT CARDIOVERSION N/A 10/07/2018   Procedure: TRANSESOPHAGEAL ECHOCARDIOGRAM (TEE);  Surgeon: Jerline Pain, MD;  Location[facility] END[facility];  Service: Cardiovascular;  Laterality: N/A;  . TONSILLECTOMY    . TRANSCATHETER AORTIC VALVE REPLACEMENT, TRANSFEMORAL N/A 07/08/2018   Procedure: TRANSCATHETER AORTIC VALVE REPLACEMENT, TRANSFEMORAL;  Surgeon: Burnell Blanks, MD;  Location: [place];  Service: Open  Heart Surgery;  Laterality: N/A;   Family History  Adopted: Yes  Problem Relation Age of Onset  . Healthy Son        x 2  . Headache Other        Cluster headaches  . Heart failure Mother   . Colon cancer Neg Hx   . Pancreatic cancer Neg Hx   . Rectal cancer Neg Hx   . Stomach cancer Neg Hx    Social History   Tobacco Use  . Smoking status: Never Smoker  . Smokeless tobacco: Never Used  Substance Use Topics  . Alcohol use: No    Alcohol/week: 0.0 standard drinks  . Drug use: Never   Current Outpatient Medications  Medication Sig Dispense Refill  . amitriptyline (ELAVIL) 25 MG tablet TAKE 1 TABLET(25 MG) BY MOUTH AT BEDTIME 90 tablet 1  . amoxicillin-clavulanate (AUGMENTIN) 875-125 MG tablet Take 1 tablet by mouth 2 (two) times daily. 10 tablet 0  . BD INSULIN SYRINGE U/F 31G X 5/16" 1 ML MISC USE TO INJECT INSULIN EVERY DAY 100 each 3  . cholecalciferol (VITAMIN D) 1000 units tablet Take 1,000 Units by mouth daily.    . ciprofloxacin (CIPRO) 500 MG tablet Take 1 tablet (500 mg total) by mouth 2 (two) times daily. 20 tablet 0  . diltiazem (CARDIZEM CD) 120 MG 24 hr capsule TAKE 1 CAPSULE(120 MG) BY MOUTH DAILY 90 capsule 1  . ELIQUIS 5  MG TABS tablet TAKE 1 TABLET BY MOUTH TWICE DAILY 180 tablet 1  . escitalopram (LEXAPRO) 20 MG tablet TAKE 1 TABLET(20 MG) BY MOUTH DAILY 90 tablet 1  . Lancets (ONETOUCH DELICA PLUS [id number]) MISC USE TO CHECK BLOOD SUGAR TWICE DAILY.. 200 each 0  . losartan (COZAAR) 50 MG tablet TAKE 1 TABLET(50 MG) BY MOUTH DAILY 90 tablet 1  . metFORMIN (GLUCOPHAGE) 500 MG tablet Take 500 mg by mouth 2 (two) times daily with a meal.     . Multiple Vitamin (MULITIVITAMIN WITH MINERALS) TABS Take 1 tablet by mouth daily.    Marland Kitchen NOVOLIN R 100 UNIT/ML injection INJECT 15 UNITS THREE TIMES DAILY BEFORE MEALS    . ONETOUCH VERIO test strip USE TO MONITOR GLUCOSE LEVELS TWICE DAILY 200 strip 3  . pantoprazole (PROTONIX) 40 MG tablet Take 1 tablet (40 mg total) by mouth daily. 90 tablet 3  . SYNTHROID 125 MCG tablet Take 125 mcg by mouth every morning.     No current facility-administered medications for this visit.   Allergies  Allergen Reactions  . Statins Other (See Comments)    Muscle aches     Review of Systems: All systems reviewed and negative except where noted in HPI.   Lab Results  Component Value Date   WBC 7.8 08/14/2019   HGB 11.3 (L) 08/14/2019   HCT 37.0 08/14/2019   MCV 64.5 (L) 08/14/2019   PLT 193 08/14/2019    CBC Latest Ref Rng & Units 08/14/2019 04/30/2019 04/30/2019  WBC 4.0 - 10.5 K/uL 7.8 7.0 -  Hemoglobin 12.0 - 15.0 g/dL 11.3(L) 8.9(L) -  Hematocrit 36.0 - 46.0 % 37.0 31.6(L) 30.4(L)  Platelets 150 - 400 K/uL 193 208 -    Lab Results  Component Value Date   IRON 104 08/14/2019   TIBC 305 08/14/2019   FERRITIN 327 (H) 08/14/2019    Lab Results  Component Value Date   CREATININE 0.90 08/14/2019   BUN 17 08/14/2019   NA 141 08/14/2019   K 4.3 08/14/2019  CL 103 08/14/2019   CO2 25 08/14/2019    Lab Results  Component Value Date   ALT 60 (H) 08/14/2019   AST 48 (H) 08/14/2019   ALKPHOS 86 08/14/2019   BILITOT 1.1 08/14/2019     Physical Exam: BP 100/60    Pulse 72   Temp (!) 96.1 F (35.6 C)   Ht 5' 5" (1.651 m)   Wt 186 lb 12.8 oz (84.7 kg)   BMI 31.09 kg/m  Constitutional: Pleasant,well-developed, female in no acute distress. Abdominal: Soft, mildly distended, nontender. There are no masses palpable.  DRE - CMA June McMurray as standby - normal tone, slightly low squeeze. Extremities: no edema Lymphadenopathy: No cervical adenopathy noted. Neurological: Alert and oriented to person place and time. Skin: Skin is warm and dry. No rashes noted. Psychiatric: Normal mood and affect. Behavior is normal.   ASSESSMENT AND PLAN: 74 year old female here for reassessment of the following issues:  Iron deficiency anemia - patient with a history of thalassemia minor with chronic microcytosis, history of microcytic anemia with normal iron stores dating back to 2020, however this past January clearly with iron deficiency and worsening microcytosis in the setting of Eliquis.  She has not had any overt bleeding.  She was given IV iron x2 which has significantly improved her hemoglobin and normalized her iron stores.  We discussed options.  Recommend she take ferrous sulfate 325 mg once daily, she states she has not tried that yet.  If she can maintain her iron studies and stable hemoglobin on the oral iron I do not think we need to pursue further work-up given her recent colonoscopy and upper endoscopy this past October without clear pathology.  She has had a CT scan of her abdomen pelvis x2 in 2020 without any concerning small bowel pathology, that is reassuring.  If she has recurrent iron deficiency in the setting of Eliquis use despite iron supplementation moving forward, would then pursue capsule study.  We discussed what a capsule study entails, she is agreeable to it if needed moving forward.  Hopefully her hemoglobin can stay stable at this time.  I will repeat her labs in 6 to 8 weeks for reassessment.  If she has any overt bleeding she should contact me  for reassessment.  Cirrhosis - secondary to fatty liver.  She is compensated at this time.  Discussed long-term risks for decompensation and HCC.  She does not drink alcohol.  Weight loss ultimately would be beneficial.  Due for [facility] screening again in August and will perform some surveillance labs at her next blood draw for her anemia.  She will see me every 6 months for this issue  Change in bowel habits / fecal and urinary incontinence - loose stools that are recent are probably due to the Rybelsus, this can happen on this regimen and she will need to discuss with her prescribing physician if that is something she wishes to continue or not.  She has some mildly low tone on DRE.  Given her urinary and fecal incontinence recommend referral to pelvic floor PT to see if we can help with that and strengthen the pelvic floor.  Otherwise recommend a trial of Citrucel to take daily to help bulk stools and hopefully prevent leakage as well.  If no improvement she will contact me.  She agreed  Dunmor Cellar, MD [facility]

## 2019-08-18 NOTE — Patient Instructions (Addendum)
If you are age 75 or older, your body mass index should be between 23-30. Your Body mass index is 31.09 kg/m. If this is out of the aforementioned range listed, please consider follow up with your Primary Care Provider.  If you are age 65 or younger, your body mass index should be between 19-25. Your Body mass index is 31.09 kg/m. If this is out of the aformentioned range listed, please consider follow up with your Primary Care Provider.   We have sent the following medications to your pharmacy for you to pick up at your convenience: Ferrous sulfate 325 mg: Take once daily  Please purchase the following medications over the counter and take as directed: Citrucel powder: Take as directed once daily  You will be referred to Pelvic Floor Physical Therapy for fecal and urinary incontinence.  They will contact you to scheduled an appointment.  You will need to go for lab work in June.  We will contact you to remind you when it is time to go to the lab in the basement of our building.   Thank you for entrusting me with your care and for choosing [facility], Dr. Gosper Cellar

## 2019-08-19 ENCOUNTER — Encounter: Payer: Self-pay | Admitting: Family Medicine

## 2019-08-25 ENCOUNTER — Ambulatory Visit (INDEPENDENT_AMBULATORY_CARE_PROVIDER_SITE_OTHER): Payer: Medicare Other | Admitting: Family Medicine

## 2019-08-25 ENCOUNTER — Other Ambulatory Visit: Payer: Self-pay

## 2019-08-25 ENCOUNTER — Encounter (INDEPENDENT_AMBULATORY_CARE_PROVIDER_SITE_OTHER): Payer: Self-pay | Admitting: Family Medicine

## 2019-08-25 VITALS — BP 123/70 | HR 73 | Temp 98.4°F | Ht 65.0 in | Wt 184.0 lb

## 2019-08-25 DIAGNOSIS — R5383 Other fatigue: Secondary | ICD-10-CM

## 2019-08-25 DIAGNOSIS — E039 Hypothyroidism, unspecified: Secondary | ICD-10-CM

## 2019-08-25 DIAGNOSIS — E119 Type 2 diabetes mellitus without complications: Secondary | ICD-10-CM

## 2019-08-25 DIAGNOSIS — K7469 Other cirrhosis of liver: Secondary | ICD-10-CM | POA: Diagnosis not present

## 2019-08-25 DIAGNOSIS — I2510 Atherosclerotic heart disease of native coronary artery without angina pectoris: Secondary | ICD-10-CM | POA: Diagnosis not present

## 2019-08-25 DIAGNOSIS — D509 Iron deficiency anemia, unspecified: Secondary | ICD-10-CM

## 2019-08-25 DIAGNOSIS — Z6830 Body mass index (BMI) 30.0-30.9, adult: Secondary | ICD-10-CM

## 2019-08-25 DIAGNOSIS — Z794 Long term (current) use of insulin: Secondary | ICD-10-CM | POA: Diagnosis not present

## 2019-08-25 DIAGNOSIS — R0602 Shortness of breath: Secondary | ICD-10-CM

## 2019-08-25 DIAGNOSIS — E669 Obesity, unspecified: Secondary | ICD-10-CM

## 2019-08-25 DIAGNOSIS — Z79899 Other long term (current) drug therapy: Secondary | ICD-10-CM

## 2019-08-25 DIAGNOSIS — Z8673 Personal history of transient ischemic attack (TIA), and cerebral infarction without residual deficits: Secondary | ICD-10-CM

## 2019-08-25 DIAGNOSIS — Z1331 Encounter for screening for depression: Secondary | ICD-10-CM | POA: Diagnosis not present

## 2019-08-25 DIAGNOSIS — Z0289 Encounter for other administrative examinations: Secondary | ICD-10-CM

## 2019-08-25 NOTE — Progress Notes (Signed)
Dear Breanna Webster,   Thank you for referring Breanna Webster to our clinic. The following note includes my evaluation and treatment recommendations.  Chief Complaint:   OBESITY Breanna Webster (MR# [id number]) is a 74 y.o. female who presents for evaluation and treatment of obesity and related comorbidities. Current BMI is Body mass index is 30.62 kg/Breanna Webster. Breanna Webster has been struggling with her weight for many years and has been unsuccessful in either losing weight, maintaining weight loss, or reaching her healthy weight goal.  Breanna Webster is currently in the action stage of change and ready to dedicate time achieving and maintaining a healthier weight. Breanna Webster is interested in becoming our patient and working on intensive lifestyle modifications including (but not limited to) diet and exercise for weight loss.  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  She says she eats twice daily.  She likes ice.  She drinks 3 glasses of water and unsweetened tea a day.  She walks for exercise.  Breanna Webster provided the following food recall today:  1st meal (10-11 am):  Eggs (omelet) with vegetables. Lunch:  Glucerna. Dinner:  Vegetables, lean protein.  Breanna Webster's habits were reviewed today and are as follows: Her family eats meals together, she thinks her family will eat healthier with her, her desired weight loss is 14 pounds, she has been heavy most of her life, her heaviest weight ever was 196 pounds, she skips meals frequently, she is frequently drinking liquids with calories, she frequently eats larger portions than normal and she struggles with emotional eating.  Depression Screen Breanna Webster Food and Mood (modified PHQ-9) score was 4.  Depression screen Breanna Webster 2/9 08/25/2019  Decreased Interest 0  Down, Depressed, Hopeless 0  PHQ - 2 Score 0  Altered sleeping 1  Tired, decreased energy 1  Change in appetite 0  Feeling bad or failure about yourself  1  Trouble concentrating 1  Moving slowly or fidgety/restless 0    Suicidal thoughts 0  PHQ-9 Score 4  Difficult doing work/chores Somewhat difficult  Some recent data might be hidden   Subjective:   1. Other fatigue Breanna Webster denies daytime somnolence and denies waking up still tired. Patent has a history of symptoms of snoring. Breanna Webster generally gets 9 hours of sleep per night, and states that she has generally restful sleep. Snoring is present. Apneic episodes are not present. Epworth Sleepiness Score is 8.  2. SOB (shortness of breath) on exertion Breanna Webster notes increasing shortness of breath with exercising and seems to be worsening over time with weight gain. She notes getting out of breath sooner with activity than she used to. This has gotten worse recently. Breanna Webster denies shortness of breath at rest or orthopnea.  3. Other cirrhosis of liver, with fatty liver disease ([place]) Followed by Breanna Webster in GI.  Note from 08/18/2019 has been reviewed.  Breanna Webster also has the diagnosis of Barrett's esophagus.  She has been referred for pelvic floor rehab for fecal/urine incontinence.  4. History of CVA (cerebrovascular accident) Breanna Webster has impaired mobility, left hand tremor, leg twitch, intermittent double vision.  She sees Dr. Wynetta Webster and has completed PT.  5. Microcytic anemia Iron deficiency and thalassemia minor.  Baseline hemoglobin 10-11 range.  Followed by Dr. Irene Webster.  Note from 08/14/2019 was reviewed.  6. Coronary artery disease involving native heart, angina presence unspecified, unspecified vessel or lesion type Status post PCI/DES x1 to MRCA (03/2018), mitral valve endocarditis (09/2018) PAF, HTN, pulmonary HTN, severe AS status post TAVR (05/2018).  Followed by Cardiology.  Note from 07/01/2019 reviewed.   7. Type 2 diabetes mellitus without complication, with long-term current use of insulin (Breanna Webster) She is taking Novolin insulin 10 units three times daily before meals and 50 units daily as well as metformin 500 mg twice daily.  Insulin was recently reduced due to  Rybelsus.  Followed by Dr. Nehemiah Webster at [facility].  Lab Results  Component Value Date   HGBA1C 7.5 (H) 10/22/2018   HGBA1C 8.5 (H) 10/05/2018   HGBA1C 9.3 (H) 07/07/2018   Lab Results  Component Value Date   MICROALBUR 0.8 10/07/2015   LDLCALC 85 10/22/2018   CREATININE 0.90 08/14/2019   Lab Results  Component Value Date   INSULIN 76.3 (H) 11/21/2016   8. Hypothyroidism, unspecified type Status post RAI.  Followed by Dr. Nehemiah Webster at [facility].  9. Polypharmacy Breanna Webster is on many medications and has difficulty affording her medication.  10. Depression screening Breanna Webster was screened for depression as part of her new patient workup today.  Assessment/Plan:   1. Other fatigue Breanna Webster does not feel that her weight is causing her energy to be lower than it should be. Fatigue may be related to obesity, depression or many other causes. Labs will be ordered, and in the meanwhile, Breanna Webster will focus on self care including making healthy food choices, increasing physical activity and focusing on stress reduction.  Orders - EKG 12-Lead  2. SOB (shortness of breath) on exertion Breanna Webster does feel that she gets out of breath more easily that she used to when she exercises. [name]'s shortness of breath appears to be obesity related and exercise induced. She has agreed to work on weight loss and gradually increase exercise to treat her exercise induced shortness of breath. Will continue to monitor closely.  3. Other cirrhosis of liver, with fatty liver disease  ([place]) Followed by Breanna Webster for this problem. Those encounter notes were reviewed. Orders and follow up as documented in patient record.  4. History of CVA (cerebrovascular accident) Current treatment plan is effective, no change in therapy. Orders and follow up as documented in patient record.  5. Microcytic anemia Orders and follow up as documented in patient record.  Counseling . Iron is essential for our bodies to make red  blood cells.  Reasons that someone may be deficient include: an iron-deficient diet (more likely in those following vegan or vegetarian diets), women with heavy menses, patients with GI disorders or poor absorption, patients that have had bariatric surgery, frequent blood donors, patients with cancer, and patients with heart disease.   [place] foods include dark leafy greens, red and white meats, eggs, seafood, and beans.   . Certain foods and drinks prevent your body from absorbing iron properly. Avoid eating these foods in the same meal as iron-rich foods or with iron supplements. These foods include: coffee, black tea, and red wine; milk, dairy products, and foods that are high in calcium; beans and soybeans; whole grains.  . Constipation can be a side effect of iron supplementation. Increased water and fiber intake are helpful. Water goal: > 2 liters/day. Fiber goal: > 25 grams/day.  6. Coronary artery disease involving native heart, angina presence unspecified, unspecified vessel or lesion type Followed by Cardiology for this problem. Those encounter notes were reviewed. Orders and follow up as documented in patient record.  7. Type 2 diabetes mellitus without complication, with long-term current use of insulin (Breanna Webster) Good blood sugar control is important to decrease the likelihood of  diabetic complications such as nephropathy, neuropathy, limb loss, blindness, coronary artery disease, and death. Intensive lifestyle modification including diet, exercise and weight loss are the first line of treatment for diabetes.   8. Hypothyroidism, unspecified type Patient with long-standing hypothyroidism, on levothyroxine therapy. She appears euthyroid. Orders and follow up as documented in patient record.  Counseling . Good thyroid control is important for overall health. Supratherapeutic thyroid levels are dangerous and will not improve weight loss results. . The correct way to take levothyroxine is  fasting, with water, separated by at least 30 minutes from breakfast, and separated by more than 4 hours from calcium, iron, multivitamins, acid reflux medications (PPIs).   9. Polypharmacy Will refer patient to [facility] for help with polypharmacy and other resources in the community.  10. Depression screening Depression screen was negative today.  11. Class 1 obesity with serious comorbidity and body mass index (BMI) of 30.0 to 30.9 in adult, unspecified obesity type Breanna Webster is currently in the action stage of change and her goal is to continue with weight loss efforts. I recommend Breanna Webster begin the structured treatment plan as follows:  She has agreed to the Category 1 Plan and the [place].  Exercise goals: As is.   Behavioral modification strategies: increasing lean protein intake, decreasing simple carbohydrates, increasing vegetables, increasing water intake and decreasing liquid calories.  She was informed of the importance of frequent follow-up visits to maximize her success with intensive lifestyle modifications for her multiple health conditions. She was informed we would discuss her lab results at her next visit unless there is a critical issue that needs to be addressed sooner. Radiah agreed to keep her next visit at the agreed upon time to discuss these results.  Objective:   Blood pressure 123/70, pulse 73, temperature 98.4 F (36.9 C), temperature source Oral, height 5' 5"  (1.651 Breanna Webster), weight 184 lb (83.5 kg), SpO2 96 %. Body mass index is 30.62 kg/Breanna Webster.  EKG: Normal sinus rhythm, rate 76 bpm.  Indirect Calorimeter completed today shows a VO2 of 180 and a REE of 1254.  Her calculated basal metabolic rate is 3335 thus her basal metabolic rate is worse than expected.  General: Cooperative, alert, well developed, in no acute distress. HEENT: Conjunctivae and lids unremarkable. Cardiovascular: Regular rhythm.  Lungs: Normal work of breathing. Neurologic: No focal deficits.   Lab  Results  Component Value Date   CREATININE 0.90 08/14/2019   BUN 17 08/14/2019   NA 141 08/14/2019   K 4.3 08/14/2019   CL 103 08/14/2019   CO2 25 08/14/2019   Lab Results  Component Value Date   ALT 60 (H) 08/14/2019   AST 48 (H) 08/14/2019   ALKPHOS 86 08/14/2019   BILITOT 1.1 08/14/2019   Lab Results  Component Value Date   HGBA1C 7.5 (H) 10/22/2018   HGBA1C 8.5 (H) 10/05/2018   HGBA1C 9.3 (H) 07/07/2018   HGBA1C 7.4 (A) 04/07/2018   HGBA1C 9.4 (A) 02/04/2018   Lab Results  Component Value Date   INSULIN 76.3 (H) 11/21/2016   Lab Results  Component Value Date   TSH 14.81 (A) 05/11/2019   Lab Results  Component Value Date   CHOL 128 10/22/2018   HDL 24 (L) 10/22/2018   LDLCALC 85 10/22/2018   LDLDIRECT 98.0 05/24/2014   TRIG 95 10/22/2018   CHOLHDL 5.3 10/22/2018   Lab Results  Component Value Date   WBC 7.8 08/14/2019   HGB 11.3 (L) 08/14/2019   HCT 37.0 08/14/2019  MCV 64.5 (L) 08/14/2019   PLT 193 08/14/2019   Lab Results  Component Value Date   IRON 104 08/14/2019   TIBC 305 08/14/2019   FERRITIN 327 (H) 08/14/2019   Attestation Statements:   This is the patient's first visit at [facility]. The patient's NEW PATIENT PACKET was reviewed at length. Included in the packet: current and past health history, medications, allergies, ROS, gynecologic history (women only), surgical history, family history, social history, weight history, weight loss surgery history (for those that have had weight loss surgery), nutritional evaluation, mood and food questionnaire, PHQ9, Epworth questionnaire, sleep habits questionnaire, patient life and health improvement goals questionnaire. These will all be scanned into the patient's chart under media.   During the visit, I independently reviewed the patient's EKG, bioimpedance scale results, and indirect calorimeter results. I used this information to tailor a meal plan for the patient that will help her to  lose weight and will improve her obesity-related conditions going forward. I performed a medically necessary appropriate examination and/or evaluation. I discussed the assessment and treatment plan with the patient. The patient was provided an opportunity to ask questions and all were answered. The patient agreed with the plan and demonstrated an understanding of the instructions. Labs were ordered at this visit and will be reviewed at the next visit unless more critical results need to be addressed immediately. Clinical information was updated and documented in the EMR.   Time spent on visit including pre-visit chart review and post-visit charting and care was 65 minutes.   I, Water quality scientist, CMA, am acting as Location manager for [employer], DO.  I have reviewed the above documentation for accuracy and completeness, and I agree with the above. Briscoe Deutscher, DO

## 2019-08-26 ENCOUNTER — Other Ambulatory Visit: Payer: Self-pay | Admitting: *Deleted

## 2019-08-26 NOTE — Patient Outreach (Signed)
[facility]) Care Management  08/26/2019  Breanna Webster 1945/08/12 [id number]  Telephone Assessment-Unsuccessful  RN attempted outreach call today concerning pharmacy referral for diabetes medications and procurement however unsuccessful call. RN be to leave a HIPAA approved voice message requesting a call back for the requested assistance.  Plan: Will continue outreach attempts to assist with pt's needs.  [name], RN Care Management Coordinator [place] Office [phone]

## 2019-08-27 ENCOUNTER — Telehealth: Payer: Self-pay | Admitting: Hematology

## 2019-08-27 DIAGNOSIS — L821 Other seborrheic keratosis: Secondary | ICD-10-CM | POA: Diagnosis not present

## 2019-08-27 DIAGNOSIS — L814 Other melanin hyperpigmentation: Secondary | ICD-10-CM | POA: Diagnosis not present

## 2019-08-27 NOTE — Telephone Encounter (Signed)
Scheduled per 04/23 los, patient has been called and voicemail was left.

## 2019-09-01 ENCOUNTER — Other Ambulatory Visit: Payer: Self-pay | Admitting: *Deleted

## 2019-09-01 ENCOUNTER — Telehealth: Payer: Self-pay

## 2019-09-01 NOTE — Patient Outreach (Signed)
[facility]) Care Management  09/01/2019  Breanna Webster Jul 24, 1945 [id number]   Telephone Assessment (CM to CM for diabetes)  Spoke with pt's spouse Breanna Webster) who updated RN case manager on pt's plan of care related to her medication issues. States pt is on several expensive medication. Note pharmacy is working with pt on medication procurement. RN discussed transitioning over to a health coach for disease management related to pt's diabetes related medication adherence. Current plan of care all goals have been met with no additional needs related to readmission or stroke related issues. Will transition of over to a health coach for quarterly follow up as requested. No additional needs via this discipline (will close) and notify the provider.  [name], RN Care Management Coordinator [place] Office [phone]

## 2019-09-01 NOTE — Telephone Encounter (Signed)
Hi Annette,   I am fine with that

## 2019-09-01 NOTE — Telephone Encounter (Signed)
Hi Cory,   I received a message where this patient was referred to [facility] for polypharmacy. I wanted to see if agree referring this patient for CCM services and therefore I will be able to assist patient further.   Thanks!

## 2019-09-02 ENCOUNTER — Other Ambulatory Visit: Payer: Self-pay | Admitting: *Deleted

## 2019-09-07 DIAGNOSIS — H52222 Regular astigmatism, left eye: Secondary | ICD-10-CM | POA: Diagnosis not present

## 2019-09-07 DIAGNOSIS — H2513 Age-related nuclear cataract, bilateral: Secondary | ICD-10-CM | POA: Diagnosis not present

## 2019-09-07 DIAGNOSIS — E113293 Type 2 diabetes mellitus with mild nonproliferative diabetic retinopathy without macular edema, bilateral: Secondary | ICD-10-CM | POA: Diagnosis not present

## 2019-09-07 DIAGNOSIS — H532 Diplopia: Secondary | ICD-10-CM | POA: Diagnosis not present

## 2019-09-07 DIAGNOSIS — H5203 Hypermetropia, bilateral: Secondary | ICD-10-CM | POA: Diagnosis not present

## 2019-09-07 DIAGNOSIS — H524 Presbyopia: Secondary | ICD-10-CM | POA: Diagnosis not present

## 2019-09-07 LAB — HM DIABETES EYE EXAM

## 2019-09-08 ENCOUNTER — Ambulatory Visit (INDEPENDENT_AMBULATORY_CARE_PROVIDER_SITE_OTHER): Payer: Medicare Other | Admitting: Family Medicine

## 2019-09-08 ENCOUNTER — Encounter (INDEPENDENT_AMBULATORY_CARE_PROVIDER_SITE_OTHER): Payer: Self-pay | Admitting: Family Medicine

## 2019-09-08 ENCOUNTER — Other Ambulatory Visit: Payer: Self-pay

## 2019-09-08 VITALS — BP 110/64 | HR 73 | Temp 98.0°F | Ht 65.0 in | Wt 180.0 lb

## 2019-09-08 DIAGNOSIS — I2510 Atherosclerotic heart disease of native coronary artery without angina pectoris: Secondary | ICD-10-CM

## 2019-09-08 DIAGNOSIS — Z6830 Body mass index (BMI) 30.0-30.9, adult: Secondary | ICD-10-CM | POA: Diagnosis not present

## 2019-09-08 DIAGNOSIS — Z794 Long term (current) use of insulin: Secondary | ICD-10-CM | POA: Diagnosis not present

## 2019-09-08 DIAGNOSIS — E669 Obesity, unspecified: Secondary | ICD-10-CM

## 2019-09-08 DIAGNOSIS — R262 Difficulty in walking, not elsewhere classified: Secondary | ICD-10-CM

## 2019-09-08 DIAGNOSIS — E119 Type 2 diabetes mellitus without complications: Secondary | ICD-10-CM | POA: Diagnosis not present

## 2019-09-08 NOTE — Progress Notes (Signed)
Chief Complaint:   OBESITY Breanna Webster is here to discuss her progress with her obesity treatment plan along with follow-up of her obesity related diagnoses. Breanna Webster is on the Category 1 Plan and the [place] and states she is following her eating plan approximately 80% of the time. Breanna Webster states she is walking for 30 minutes 3 times per week.  Today's visit was #: 2 Starting weight: 184 lbs Starting date: 08/25/2019 Today's weight: 180 lbs Today's date: 09/08/2019 Total lbs lost to date: 4 lbs Total lbs lost since last in-office visit: 4 lbs  Interim History: Breanna Webster will be getting [employer] for her insulin.  She says she cannot eat all prescribed food.  She is unable to find the 50 calorie cheese or the 40 calorie bread (using 70 calorie bread).  She likes [place], chicken patties, and tuna.  I have discussed with her that it is okay to have unsweetened applesauce instead of apples.  Subjective:   1. Difficulty walking Breanna Webster will be seeing Neurology soon to evaluate her double vision.  2. Type 2 diabetes mellitus without complication, with long-term current use of insulin (HCC) Medications reviewed. Diabetic ROS: no polyuria or polydipsia, no chest pain, dyspnea or TIA's.  Breanna Webster will see Dr. [name] in 2 weeks.  Lab Results  Component Value Date   HGBA1C 7.5 (H) 10/22/2018   HGBA1C 8.5 (H) 10/05/2018   HGBA1C 9.3 (H) 07/07/2018   Lab Results  Component Value Date   MICROALBUR 0.8 10/07/2015   LDLCALC 85 10/22/2018   CREATININE 0.90 08/14/2019   Lab Results  Component Value Date   INSULIN 76.3 (H) 11/21/2016   Assessment/Plan:   1. Difficulty walking Will continue to monitor.   2. Type 2 diabetes mellitus without complication, with long-term current use of insulin (HCC) Good blood sugar control is important to decrease the likelihood of diabetic complications such as nephropathy, neuropathy, limb loss, blindness, coronary artery disease, and death. Intensive lifestyle  modification including diet, exercise and weight loss are the first line of treatment for diabetes.   3. Class 1 obesity with serious comorbidity and body mass index (BMI) of 30.0 to 30.9 in adult, unspecified obesity type Breanna Webster is currently in the action stage of change. As such, her goal is to continue with weight loss efforts. She has agreed to the Category 1 Plan.   Exercise goals: No exercise has been prescribed at this time.  Behavioral modification strategies: increasing lean protein intake and increasing water intake.  Breanna Webster has agreed to follow-up with our clinic in 2 weeks. She was informed of the importance of frequent follow-up visits to maximize her success with intensive lifestyle modifications for her multiple health conditions.   Objective:   Blood pressure 110/64, pulse 73, temperature 98 F (36.7 C), temperature source Oral, height 5' 5"  (1.651 m), weight 180 lb (81.6 kg), SpO2 97 %. Body mass index is 29.95 kg/m.  General: Cooperative, alert, well developed, in no acute distress. HEENT: Conjunctivae and lids unremarkable. Cardiovascular: Regular rhythm.  Lungs: Normal work of breathing. Neurologic: No focal deficits.   Lab Results  Component Value Date   CREATININE 0.90 08/14/2019   BUN 17 08/14/2019   NA 141 08/14/2019   K 4.3 08/14/2019   CL 103 08/14/2019   CO2 25 08/14/2019   Lab Results  Component Value Date   ALT 60 (H) 08/14/2019   AST 48 (H) 08/14/2019   ALKPHOS 86 08/14/2019   BILITOT 1.1 08/14/2019   Lab  Results  Component Value Date   HGBA1C 7.5 (H) 10/22/2018   HGBA1C 8.5 (H) 10/05/2018   HGBA1C 9.3 (H) 07/07/2018   HGBA1C 7.4 (A) 04/07/2018   HGBA1C 9.4 (A) 02/04/2018   Lab Results  Component Value Date   INSULIN 76.3 (H) 11/21/2016   Lab Results  Component Value Date   TSH 14.81 (A) 05/11/2019   Lab Results  Component Value Date   CHOL 128 10/22/2018   HDL 24 (L) 10/22/2018   LDLCALC 85 10/22/2018   LDLDIRECT 98.0 05/24/2014     TRIG 95 10/22/2018   CHOLHDL 5.3 10/22/2018   Lab Results  Component Value Date   WBC 7.8 08/14/2019   HGB 11.3 (L) 08/14/2019   HCT 37.0 08/14/2019   MCV 64.5 (L) 08/14/2019   PLT 193 08/14/2019   Lab Results  Component Value Date   IRON 104 08/14/2019   TIBC 305 08/14/2019   FERRITIN 327 (H) 08/14/2019   Obesity Behavioral Intervention:   Approximately 15 minutes were spent on the discussion below.  ASK: We discussed the diagnosis of obesity with Breanna Webster today and Breanna Webster agreed to give [name] permission to discuss obesity behavioral modification therapy today.  ASSESS: Breanna Webster has the diagnosis of obesity and her BMI today is 30.0. Breanna Webster is in the action stage of change.   ADVISE: Breanna Webster was educated on the multiple health risks of obesity as well as the benefit of weight loss to improve her health. She was advised of the need for long term treatment and the importance of lifestyle modifications to improve her current health and to decrease her risk of future health problems.  AGREE: Multiple dietary modification options and treatment options were discussed and Breanna Webster agreed to follow the recommendations documented in the above note.  ARRANGE: Breanna Webster was educated on the importance of frequent visits to treat obesity as outlined per CMS and USPSTF guidelines and agreed to schedule her next follow up appointment today.  Attestation Statements:   Reviewed by clinician on day of visit: allergies, medications, problem list, medical history, surgical history, family history, social history, and previous encounter notes.  I, Water quality scientist, CMA, am acting as Location manager for [employer], DO.  I have reviewed the above documentation for accuracy and completeness, and I agree with the above. Briscoe Deutscher, DO

## 2019-09-09 ENCOUNTER — Other Ambulatory Visit: Payer: Self-pay | Admitting: Adult Health

## 2019-09-09 ENCOUNTER — Telehealth: Payer: Self-pay | Admitting: Adult Health

## 2019-09-09 DIAGNOSIS — E0865 Diabetes mellitus due to underlying condition with hyperglycemia: Secondary | ICD-10-CM

## 2019-09-09 DIAGNOSIS — I2510 Atherosclerotic heart disease of native coronary artery without angina pectoris: Secondary | ICD-10-CM

## 2019-09-09 DIAGNOSIS — E785 Hyperlipidemia, unspecified: Secondary | ICD-10-CM

## 2019-09-09 NOTE — Telephone Encounter (Signed)
Hi Cory,  In order to schedule patient, can you place an ambulatory referral to CCM?  Thanks!

## 2019-09-09 NOTE — Telephone Encounter (Signed)
Referral placed.

## 2019-09-09 NOTE — Chronic Care Management (AMB) (Signed)
  Chronic Care Management   Note  09/09/2019 Name: Breanna Webster MRN: [id number] DOB: 1946/02/07  Breanna Webster is a 74 y.o. year old female who is a primary care patient of [name], NP. I reached out to [name] by phone today in response to a referral sent by Breanna Webster's PCP, [name], NP.   Breanna Webster was given information about Chronic Care Management services today including:  1. CCM service includes personalized support from designated clinical staff supervised by her physician, including individualized plan of care and coordination with other care providers 2. 24/7 contact phone numbers for assistance for urgent and routine care needs. 3. Service will only be billed when office clinical staff spend 20 minutes or more in a month to coordinate care. 4. Only one practitioner may furnish and bill the service in a calendar month. 5. The patient may stop CCM services at any time (effective at the end of the month) by phone call to the office staff.   Patient agreed to services and verbal consent obtained.   Follow up plan:   [place]

## 2019-09-11 ENCOUNTER — Emergency Department (HOSPITAL_COMMUNITY)
Admission: EM | Admit: 2019-09-11 | Discharge: 2019-09-11 | Disposition: A | Discharge status: Home / Self Care | Payer: Medicare Other | Attending: Emergency Medicine | Admitting: Emergency Medicine

## 2019-09-11 ENCOUNTER — Telehealth: Payer: Self-pay | Admitting: Adult Health

## 2019-09-11 ENCOUNTER — Encounter (HOSPITAL_COMMUNITY): Payer: Self-pay | Admitting: Emergency Medicine

## 2019-09-11 ENCOUNTER — Other Ambulatory Visit: Payer: Self-pay

## 2019-09-11 ENCOUNTER — Emergency Department (HOSPITAL_COMMUNITY): Payer: Medicare Other

## 2019-09-11 DIAGNOSIS — Z79899 Other long term (current) drug therapy: Secondary | ICD-10-CM | POA: Diagnosis not present

## 2019-09-11 DIAGNOSIS — I10 Essential (primary) hypertension: Secondary | ICD-10-CM | POA: Insufficient documentation

## 2019-09-11 DIAGNOSIS — Z794 Long term (current) use of insulin: Secondary | ICD-10-CM | POA: Insufficient documentation

## 2019-09-11 DIAGNOSIS — I2510 Atherosclerotic heart disease of native coronary artery without angina pectoris: Secondary | ICD-10-CM | POA: Insufficient documentation

## 2019-09-11 DIAGNOSIS — K5900 Constipation, unspecified: Secondary | ICD-10-CM | POA: Diagnosis not present

## 2019-09-11 DIAGNOSIS — E119 Type 2 diabetes mellitus without complications: Secondary | ICD-10-CM | POA: Insufficient documentation

## 2019-09-11 DIAGNOSIS — R1084 Generalized abdominal pain: Secondary | ICD-10-CM

## 2019-09-11 DIAGNOSIS — E039 Hypothyroidism, unspecified: Secondary | ICD-10-CM | POA: Insufficient documentation

## 2019-09-11 DIAGNOSIS — K5730 Diverticulosis of large intestine without perforation or abscess without bleeding: Secondary | ICD-10-CM | POA: Diagnosis not present

## 2019-09-11 DIAGNOSIS — Z7901 Long term (current) use of anticoagulants: Secondary | ICD-10-CM | POA: Diagnosis not present

## 2019-09-11 DIAGNOSIS — K760 Fatty (change of) liver, not elsewhere classified: Secondary | ICD-10-CM | POA: Diagnosis not present

## 2019-09-11 LAB — CBC WITH DIFFERENTIAL/PLATELET
Abs Immature Granulocytes: 0.02 10*3/uL (ref 0.00–0.07)
Basophils Absolute: 0.1 10*3/uL (ref 0.0–0.1)
Basophils Relative: 1 %
Eosinophils Absolute: 0.2 10*3/uL (ref 0.0–0.5)
Eosinophils Relative: 3 %
HCT: 35 % — ABNORMAL LOW (ref 36.0–46.0)
Hemoglobin: 10.7 g/dL — ABNORMAL LOW (ref 12.0–15.0)
Immature Granulocytes: 0 %
Lymphocytes Relative: 21 %
Lymphs Abs: 1.5 10*3/uL (ref 0.7–4.0)
MCH: 20.4 pg — ABNORMAL LOW (ref 26.0–34.0)
MCHC: 30.6 g/dL (ref 30.0–36.0)
MCV: 66.7 fL — ABNORMAL LOW (ref 80.0–100.0)
Monocytes Absolute: 0.5 10*3/uL (ref 0.1–1.0)
Monocytes Relative: 7 %
Neutro Abs: 5 10*3/uL (ref 1.7–7.7)
Neutrophils Relative %: 68 %
Platelets: 187 10*3/uL (ref 150–400)
RBC: 5.25 MIL/uL — ABNORMAL HIGH (ref 3.87–5.11)
RDW: 15.7 % — ABNORMAL HIGH (ref 11.5–15.5)
WBC: 7.3 10*3/uL (ref 4.0–10.5)
nRBC: 0 % (ref 0.0–0.2)

## 2019-09-11 LAB — COMPREHENSIVE METABOLIC PANEL
ALT: 55 U/L — ABNORMAL HIGH (ref 0–44)
AST: 48 U/L — ABNORMAL HIGH (ref 15–41)
Albumin: 4.4 g/dL (ref 3.5–5.0)
Alkaline Phosphatase: 72 U/L (ref 38–126)
Anion gap: 9 (ref 5–15)
BUN: 18 mg/dL (ref 8–23)
CO2: 27 mmol/L (ref 22–32)
Calcium: 10.3 mg/dL (ref 8.9–10.3)
Chloride: 103 mmol/L (ref 98–111)
Creatinine, Ser: 0.72 mg/dL (ref 0.44–1.00)
GFR calc Af Amer: >60 mL/min (ref >60)
GFR calc non Af Amer: >60 mL/min (ref >60)
Glucose, Bld: 142 mg/dL — ABNORMAL HIGH (ref 70–99)
Potassium: 4.2 mmol/L (ref 3.5–5.1)
Sodium: 139 mmol/L (ref 135–145)
Total Bilirubin: 1.1 mg/dL (ref 0.3–1.2)
Total Protein: 7.5 g/dL (ref 6.5–8.1)

## 2019-09-11 LAB — LIPASE, BLOOD: Lipase: 16 U/L (ref 11–51)

## 2019-09-11 MED ORDER — SODIUM CHLORIDE (PF) 0.9 % IJ SOLN
INTRAMUSCULAR | Status: AC
  Filled 2019-09-11: qty 50

## 2019-09-11 MED ORDER — ONDANSETRON 4 MG PO TBDP
4.0000 mg | ORAL_TABLET | Freq: Three times a day (TID) | ORAL | 0 refills | Status: DC | PRN
Start: 2019-09-11 — End: 2019-10-22

## 2019-09-11 MED ORDER — IOHEXOL 300 MG/ML  SOLN
100.0000 mL | Freq: Once | INTRAMUSCULAR | Status: AC | PRN
  Administered 2019-09-11: 100 mL via INTRAVENOUS

## 2019-09-11 NOTE — Telephone Encounter (Signed)
Patient called up here wanting an appointment unfortunately no appointments are available.  I called the patient and she explained to me that over the last 5 days she has been having some diffuse stabbing abdominal pain that is constant.  She denies fevers chills, or nausea.  She has not been able to pass gas and her last bowel movement was 2 days ago which is unusual for her.  Pain is worse with palpation per patient report.  Patient was advised that due to her symptoms I am concerned for bowel obstruction.  She is agreeable to go to [facility] long emergency room for further evaluation

## 2019-09-11 NOTE — ED Notes (Signed)
Pt verbalizes understanding of DC instructions. Pt belongings returned and is ambulatory out of ED.  

## 2019-09-11 NOTE — Discharge Instructions (Addendum)
You can take miralax and colace, available over the counter twice daily until symptoms improve.  Please follow up with your family doctor and Gastroenterologist for further evaluation.   Call your doctor about your Rybelsus - it may be contributing to your abdominal discomfort.

## 2019-09-11 NOTE — ED Notes (Signed)
ED Provider at bedside. 

## 2019-09-11 NOTE — ED Provider Notes (Signed)
[facility] DEPT Provider Note   CSN: [id number] Arrival date & time: 09/11/19  1353     History Chief Complaint  Patient presents with  . Abdominal Pain    Breanna Webster is a 74 y.o. female.  The history is provided by the patient, medical records and the spouse. No language interpreter was used.  Abdominal Pain  Breanna Webster is a 74 y.o. female who presents to the Emergency Department complaining of abdominal pain. She presents the emergency department accompanied by her husband for evaluation of five days of abdominal pain. It is located in the right upper quadrant radiates to the left upper quadrant. She has associated decreased stool caliber she has associated nausea. She feels abdominal bloating. No reports of fevers, chest pain, vomiting, dysuria. She did recently start taking rybelsus for her diabetes a few weeks ago. No alcohol use.    Past Medical History:  Diagnosis Date  . Anxiety   . Arthritis    "back" (04/22/2018)  . Back pain   . Blood transfusion without reported diagnosis   . CAD (coronary artery disease)    a. 03/2018 s/p PCI/DES to the RCA (3.0x15 Onyx DES).  . Carotid artery stenosis    Mild  . Chest pain   . Chronic lower back pain   . Cirrhosis ([place])   . Colon polyps   . Diverticulitis   . Diverticulosis   . Esophageal thickening    seen on pre TAVR CT scan, also questionable cirrhosis. MRI recommended. Will refer to GI  . Fatty liver   . GERD (gastroesophageal reflux disease)   . Grave's disease   . History of colonic polyps 05/22/2017  . History of hiatal hernia   . Hypertension   . Hypothyroidism   . IBS (irritable bowel syndrome)   . Osteopenia   . Pulmonary nodules    seen on pre TAVR CT. likley benign. no follow up recommended if pt low risk.  . S/P TAVR (transcatheter aortic valve replacement)   . Severe aortic stenosis   . Shortness of breath on exertion   . Stroke ([place])   . Thalassemia minor     . Thyroid disease   . Type II diabetes mellitus [facility])     Patient Active Problem List   Diagnosis Date Noted  . Iron deficiency anemia 05/07/2019  . Atrial fibrillation with RVR ([place]) 10/21/2018  . Cerebellar stroke, acute ([place]) 10/21/2018  . Streptococcal endocarditis   . Endocarditis of mitral valve 10/07/2018  . Bacteremia due to Streptococcus Salivarius 10/07/2018  . Sepsis ([place]) 10/04/2018  . Acute metabolic encephalopathy 97/35/3299  . Severe aortic stenosis 07/08/2018  . S/P TAVR (transcatheter aortic valve replacement) 07/08/2018  . Esophageal thickening   . CAD in native artery 04/22/2018  . Gastroesophageal reflux disease   . Pulmonary hypertension ([place]) 02/21/2018  . Essential hypertension 07/15/2017  . History of colonic polyps 05/22/2017  . Elevated liver function tests 12/05/2016  . DOE (dyspnea on exertion) 07/19/2016  . Thalassemia minor 05/29/2016  . Left bundle branch block 12/06/2015  . Upper airway cough syndrome 10/14/2015  . Myalgia 02/17/2014  . Carotid artery stenosis 06/05/2013  . Eustachian tube dysfunction 05/07/2013  . Neuropathy of leg 03/07/2012  . Anemia, unspecified 01/31/2012  . Hot flashes 08/24/2010  . Diabetes mellitus due to underlying condition, uncontrolled ([place]) 06/30/2010  . Obesity 12/21/2009  . IBS (irritable bowel syndrome) 04/29/2009  . Vitamin D deficiency 03/10/2009  . Hypothyroidism 12/13/2008  .  Dyslipidemia 12/13/2008  . Anxiety state 12/13/2008  . Other specified disorders of bladder 12/13/2008    Past Surgical History:  Procedure Laterality Date  . [place] STUDY N/A 03/03/2018   Procedure: [place] STUDY;  Surgeon: Mauri Pole, MD;  Location: [facility] ENDOSCOPY;  Service: Endoscopy;  Laterality: N/A;  . COLONOSCOPY    . COLONOSCOPY W/ BIOPSIES AND POLYPECTOMY    . CORONARY ANGIOGRAPHY Right 04/21/2018   Procedure: CORONARY ANGIOGRAPHY (CATH LAB);  Surgeon: [name], MD;  Location: [facility] CV LAB;   Service: Cardiovascular;  Laterality: Right;  . CORONARY STENT INTERVENTION N/A 04/22/2018   Procedure: CORONARY STENT INTERVENTION;  Surgeon: [name], MD;  Location: [facility] LAB;  Service: Cardiovascular;  Laterality: N/A;  . DILATION AND CURETTAGE OF UTERUS    . ESOPHAGEAL MANOMETRY N/A 03/03/2018   Procedure: ESOPHAGEAL MANOMETRY (EM);  Surgeon: Mauri Pole, MD;  Location: [facility] ENDOSCOPY;  Service: Endoscopy;  Laterality: N/A;  . GASTRIC FUNDOPLICATION    . HERNIA REPAIR    . HYSTEROSCOPY     fibroids  . LAPAROSCOPIC CHOLECYSTECTOMY    . LAPAROSCOPY     fibroids  . NISSEN FUNDOPLICATION  4827M  . POLYPECTOMY    . RIGHT/LEFT HEART CATH AND CORONARY ANGIOGRAPHY N/A 02/20/2018   Procedure: RIGHT/LEFT HEART CATH AND CORONARY ANGIOGRAPHY;  Surgeon: [name], MD;  Location: [facility];  Service: Cardiovascular;  Laterality: N/A;  . TEE WITHOUT CARDIOVERSION N/A 07/08/2018   Procedure: TRANSESOPHAGEAL ECHOCARDIOGRAM (TEE);  Surgeon: Burnell Blanks, MD;  Location: [facility];  Service: Open Heart Surgery;  Laterality: N/A;  . TEE WITHOUT CARDIOVERSION  10/07/2018  . TEE WITHOUT CARDIOVERSION N/A 10/07/2018   Procedure: TRANSESOPHAGEAL ECHOCARDIOGRAM (TEE);  Surgeon: Jerline Pain, MD;  Location: [facility] ENDOSCOPY;  Service: Cardiovascular;  Laterality: N/A;  . TONSILLECTOMY    . TRANSCATHETER AORTIC VALVE REPLACEMENT, TRANSFEMORAL N/A 07/08/2018   Procedure: TRANSCATHETER AORTIC VALVE REPLACEMENT, TRANSFEMORAL;  Surgeon: Burnell Blanks, MD;  Location: [facility];  Service: Open Heart Surgery;  Laterality: N/A;     OB History    Gravida  2   Para  2   Term  2   Preterm      AB      Living        SAB      TAB      Ectopic      Multiple      Live Births              Family History  Adopted: Yes  Problem Relation Age of Onset  . Healthy Son        x 2  . Headache Other        Cluster headaches  . Heart failure Mother   . Colon  cancer Neg Hx   . Pancreatic cancer Neg Hx   . Rectal cancer Neg Hx   . Stomach cancer Neg Hx     Social History   Tobacco Use  . Smoking status: Never Smoker  . Smokeless tobacco: Never Used  Substance Use Topics  . Alcohol use: No    Alcohol/week: 0.0 standard drinks  . Drug use: Never    Home Medications Prior to Admission medications   Medication Sig Start Date End Date Taking? Authorizing Provider  amitriptyline (ELAVIL) 25 MG tablet TAKE 1 TABLET(25 MG) BY MOUTH AT BEDTIME Patient taking differently: Take 12.5 mg by mouth at bedtime.  06/09/19  Yes Nafziger, Tommi Rumps, NP  cholecalciferol (VITAMIN D) 1000 units tablet Take 1,000 Units by mouth daily.   Yes [provider]  diltiazem (CARDIZEM CD) 120 MG 24 hr capsule TAKE 1 CAPSULE(120 MG) BY MOUTH DAILY Patient taking differently: Take 120 mg by mouth every evening.  06/05/19  Yes Nafziger, Tommi Rumps, NP  ELIQUIS 5 MG TABS tablet TAKE 1 TABLET BY MOUTH TWICE DAILY 07/09/19  Yes Nafziger, Tommi Rumps, NP  escitalopram (LEXAPRO) 20 MG tablet TAKE 1 TABLET(20 MG) BY MOUTH DAILY Patient taking differently: Take 20 mg by mouth daily.  06/09/19  Yes Nafziger, Tommi Rumps, NP  ferrous sulfate 325 (65 FE) MG tablet Take 1 tablet (325 mg total) by mouth daily with breakfast. 08/18/19  Yes [name], [name], MD  losartan (COZAAR) 50 MG tablet TAKE 1 TABLET(50 MG) BY MOUTH DAILY Patient taking differently: Take 50 mg by mouth daily.  04/21/19  Yes Nafziger, Tommi Rumps, NP  metFORMIN (GLUCOPHAGE) 500 MG tablet Take 500 mg by mouth 2 (two) times daily with a meal.  12/01/18  Yes [provider]  Multiple Vitamin (MULITIVITAMIN WITH MINERALS) TABS Take 1 tablet by mouth daily.   Yes [provider]  NOVOLIN N 100 UNIT/ML injection Inject 55 Units into the skin daily before breakfast.    Yes [provider]  NOVOLIN R 100 UNIT/ML injection 10 UNITS THIRTY MINUTES BEFORE MEALS.  5 ADDITIONAL UNITS WITH CARBS OR SNACKS.   Yes [provider]  pantoprazole (PROTONIX) 40 MG tablet Take 1 tablet (40 mg total) by mouth daily. 02/16/19  Yes [name], [name], MD  Semaglutide (RYBELSUS) 3 MG TABS Take 1 tablet by mouth daily.   Yes [provider]  SYNTHROID 125 MCG tablet Take 125 mcg by mouth every morning. 04/14/19  Yes [provider]  BD INSULIN SYRINGE U/F 31G X 5/16" 1 ML MISC USE TO INJECT INSULIN EVERY DAY 11/18/18   Renato Shin, MD  Lancets (ONETOUCH DELICA PLUS [id number]) [place] USE TO CHECK BLOOD SUGAR TWICE DAILY.. 02/09/19   Renato Shin, MD  ondansetron (ZOFRAN ODT) 4 MG disintegrating tablet Take 1 tablet (4 mg total) by mouth every 8 (eight) hours as needed for nausea or vomiting. 09/11/19   Quintella Reichert, MD  [facility] VERIO test strip USE TO MONITOR GLUCOSE LEVELS TWICE DAILY 12/29/18   Renato Shin, MD    Allergies    Statins  Review of Systems   Review of Systems  Gastrointestinal: Positive for abdominal pain.  All other systems reviewed and are negative.   Physical Exam Updated Vital Signs BP (!) 131/56   Pulse 67   Temp 98.4 F (36.9 C) (Oral)   Resp 18   Ht 5' 5"  (1.651 m)   Wt 81.2 kg   SpO2 94%   BMI 29.79 kg/m   Physical Exam Vitals and nursing note reviewed.  Constitutional:      Appearance: She is well-developed.  HENT:     Head: Normocephalic and atraumatic.  Cardiovascular:     Rate and Rhythm: Normal rate and regular rhythm.     Heart sounds: No murmur.  Pulmonary:     Effort: Pulmonary effort is normal. No respiratory distress.     Breath sounds: Normal breath sounds.  Abdominal:     General: There is distension.     Palpations: Abdomen is soft.     Tenderness: There is no guarding or rebound.     Comments: Mild generalized abdominal tenderness  Musculoskeletal:  General: No swelling or tenderness.  Skin:    General: Skin is warm and dry.  Neurological:     Mental Status: She is alert and oriented to person, place, and time.    Psychiatric:        Behavior: Behavior normal.     ED Results / Procedures / Treatments   Labs (all labs ordered are listed, but only abnormal results are displayed) Labs Reviewed  COMPREHENSIVE METABOLIC PANEL - Abnormal; Notable for the following components:      Result Value   Glucose, Bld 142 (*)    AST 48 (*)    ALT 55 (*)    All other components within normal limits  CBC WITH DIFFERENTIAL/PLATELET - Abnormal; Notable for the following components:   RBC 5.25 (*)    Hemoglobin 10.7 (*)    HCT 35.0 (*)    MCV 66.7 (*)    MCH 20.4 (*)    RDW 15.7 (*)    All other components within normal limits  LIPASE, BLOOD    EKG None  Radiology CT Abdomen Pelvis W Contrast  Result Date: 09/11/2019 CLINICAL DATA:  Progressive abdominal pain and nausea for the past 5 days. Difficulty defecating since the pain began. EXAM: CT ABDOMEN AND PELVIS WITH CONTRAST TECHNIQUE: Multidetector CT imaging of the abdomen and pelvis was performed using the standard protocol following bolus administration of intravenous contrast. CONTRAST:  178m OMNIPAQUE IOHEXOL 300 MG/ML  SOLN COMPARISON:  10/04/2018 FINDINGS: Lower chest: Unremarkable. Hepatobiliary: Mild diffuse low density of the liver relative to the spleen. Cholecystectomy clips. Pancreas: Marked diffuse atrophy with fatty replacement. Spleen: Normal in size without focal abnormality. Adrenals/Urinary Tract: Small left and tiny right renal cysts. Normal appearing adrenal glands, ureters and urinary bladder. Stomach/Bowel: Multiple sigmoid and descending colon diverticula without evidence of diverticulitis. Post Nissen fundoplication changes. Mild diffuse low density distal esophageal wall thickening. Normal appearing small bowel and appendix. Vascular/Lymphatic: Atheromatous arterial calcifications without aneurysm. No enlarged lymph nodes. Reproductive: Uterus and bilateral adnexa are unremarkable. Other: Mild bilateral anterior subcutaneous soft  tissue stranding suggesting previous or recent subcutaneous injections. Musculoskeletal: L3-4 degenerative changes and lower thoracic spine degenerative changes. IMPRESSION: 1. No acute abnormality. 2. Mild diffuse low density distal esophageal wall thickening, compatible with esophagitis. 3. Colonic diverticulosis. 4. Mild diffuse hepatic steatosis. Electronically Signed   By: SClaudie ReveringM.D.   On: 09/11/2019 16:51    Procedures Procedures (including critical care time)  Medications Ordered in ED Medications  iohexol (OMNIPAQUE) 300 MG/ML solution 100 mL (100 mLs Intravenous Contrast Given 09/11/19 1622)  sodium chloride (PF) 0.9 % injection (  Given by Other 09/11/19 1653)    ED Course  I have reviewed the triage vital signs and the nursing notes.  Pertinent labs & imaging results that were available during my care of the patient were reviewed by me and considered in my medical decision making (see chart for details).    MDM Rules/Calculators/A&P                     Patient here for evaluation of five days of abdominal bloating, nausea and decreased caliber stools. She does have mild tenderness on examination with no peritoneal findings. Labs with stable anemia, stable mild elevation in her transaminases when compared to priors. CT scan without acute abnormality. Given her history of decreased caliber stools will start a stool softener in the event she has a component of constipation. Will treat nausea with Zofran. Discussed with  patient findings of studies. Discussed importance of PCP as well as G.I. follow-up as well as return precautions.  Discuss with patient and husband concerned that some of her symptoms may be due to her Rybelsus -  recommend discussing this with her prescribing doctor about pausing or discontinuing the medication.  Final Clinical Impression(s) / ED Diagnoses Final diagnoses:  Generalized abdominal pain  Constipation, unspecified constipation type    Rx / DC  Orders ED Discharge Orders         Ordered    ondansetron (ZOFRAN ODT) 4 MG disintegrating tablet  Every 8 hours PRN     09/11/19 1709           Quintella Reichert, MD 09/11/19 1719

## 2019-09-11 NOTE — ED Triage Notes (Signed)
Patient arrived by self from home w/ husband. Patient c/o ABD pain x 5 days.   Patient began having trouble defecating at the same time of ABD pain.   Hx of stroke, DM, aortic valve replacement w/ stent placement, and bacterial heart infection.

## 2019-09-11 NOTE — Telephone Encounter (Signed)
Patient and her husband were both on the phone discussing patient having stomach pains and cramps.  Patient is requesting an appointment in office today.  They were upset once they were explained that it would have to be a virtual appointment with another doctor, so husband is requesting a call back about her symptoms.

## 2019-09-14 ENCOUNTER — Other Ambulatory Visit: Payer: Self-pay

## 2019-09-14 ENCOUNTER — Encounter: Payer: Self-pay | Admitting: Adult Health

## 2019-09-14 ENCOUNTER — Ambulatory Visit (INDEPENDENT_AMBULATORY_CARE_PROVIDER_SITE_OTHER): Payer: Medicare Other | Admitting: Adult Health

## 2019-09-14 VITALS — BP 125/75 | HR 79 | Ht 65.0 in | Wt 181.0 lb

## 2019-09-14 DIAGNOSIS — R42 Dizziness and giddiness: Secondary | ICD-10-CM | POA: Diagnosis not present

## 2019-09-14 DIAGNOSIS — Z8673 Personal history of transient ischemic attack (TIA), and cerebral infarction without residual deficits: Secondary | ICD-10-CM | POA: Diagnosis not present

## 2019-09-14 DIAGNOSIS — H5509 Other forms of nystagmus: Secondary | ICD-10-CM

## 2019-09-14 DIAGNOSIS — I69398 Other sequelae of cerebral infarction: Secondary | ICD-10-CM | POA: Diagnosis not present

## 2019-09-14 DIAGNOSIS — R29818 Other symptoms and signs involving the nervous system: Secondary | ICD-10-CM | POA: Diagnosis not present

## 2019-09-14 DIAGNOSIS — I480 Paroxysmal atrial fibrillation: Secondary | ICD-10-CM | POA: Diagnosis not present

## 2019-09-14 NOTE — Progress Notes (Signed)
[facility] [address] [place]. [place] [address] [phone]       OFFICE FOLLOW UP NOTE  Breanna Webster Date of Birth:  May 11, 1945 Medical Record Number:  [id number]   Reason for Referral: stroke follow up    CHIEF COMPLAINT:  Chief Complaint  Patient presents with  . Follow-up    RM 9 here for a f/u on a stroke. Pt said she is having new sx with tremmor and rls.    HPI: Today 09/14/2019 Breanna Webster is a 74 year old female with underlying medical history of DVT history, aortic stenosis s/p TAVR 06/2018, hypothyroidism, pulmonary hypertension, HTN, DM, CAD s/p stenting 03/2018, hx of infective endocarditis 09/2018 and multifocal bilateral anterior posterior infarcts secondary to new onset A. fib in 09/2018.  Patient requests appointment today with concern for intermittent double vision, imbalance, dizziness and left hand shaking.  She is accompanied by her husband.  Previously seen roughly 6 months ago stable from a stroke standpoint and advised to follow-up as needed.  Prior complaints of intermittent diplopia, left hemiparesis and imbalance secondary to multiple cardioembolic strokes throughout the supratentorial brain, brainstem and cerebellum including multiple lesions within the corpus callosum.  She believes she continued to have intermittent symptoms but believes over the past few months symptoms have worsened or become more frequent.  she did have ophthalmology evaluation on 09/07/2019 with concern of bilateral diplopia worsened with visual tasks with saccadic changes.  Experiences worsening dizziness or diplopia while watching TV for too long.  Also experience dizziness if standing too quickly or rapid head movements.  Symptoms only last for 10 to 20 seconds and then resolve.  Episodes occur 1-2 times per week.  Denies headache, migraine, worsening weakness, numbness/tingling, speech impairment or confusion.  She endorses ongoing compliance with all  prescribed medications including Eliquis 5 mg twice daily.  Blood pressure monitored at home which has been stable with today's reading 125/75.  Underlying mild cognitive impairment post stroke which has been stable without worsening.  She questions clearance for driving.  Continues to follow with PCP for HTN, HLD and DM management.  No further concerns at this time.     History provided for reference purposes only Update 03/12/2019 JM: Breanna Webster is a 74 year old female who is being seen today for stroke follow-up accompanied by her husband.  She continues to experience balance difficulties, mild left hemiparesis and mild cognitive impairment without much improvement.  Her greatest concern today is ongoing shortness of breath which has been present since the beginning of this year.  Her shortness of breath severely limits her daily functioning and activity.  Recently seen by Dr. Tamala Julian, her cardiologist, who felt continued shortness of breath was multifactorial but highly encouraged increasing activity and possibly cardiac rehab.  She feels as though "something is being missed" and is overly frustrated.  She also continues to experience excessive daytime fatigue and insomnia at night.  Prior sleep study negative for OSA.  She continues on Eliquis and aspirin without bleeding or bruising.  Blood pressure today satisfactory at 128/78.  Continues on home Lexapro 20 mg daily and feels as though depression and anxiety stable.  She also continues on amitriptyline 25 mg nightly for prior concerns with headache.  She denies any reoccurring headaches at this time.  No further concerns at this time.  Initial visit 12/10/2018 JM: Residual deficits of mild left hemiparesis, mild left facial droop with occasional speech difficulty, balance difficulties, decreased vision and short-term memory concerns.  She does have difficulty ambulating at times stating her legs feel "rubbery" typically worse worsening in the morning  and in the evening.  She is able to ambulate without assistive device.  Prior complaints of left-sided vision with sensation of a shade pulled over her left eye but this is since resolved and recently obtained new prescription glasses by ophthalmology.  She also endorses short-term memory concerns such as forgetting recent question/answer or her husband having to continuously repeat himself.  She denies improvement but her husband states he has seen improvement of her overall ambulation and memory concerns.  She has not received any therapy as this was not recommended at discharge.  She is questioning possible participation in therapy. Previously experiencing left-sided severe headache with recent cessation with continued use of  amitriptyline 25 mg nightly and gabapentin 300 mg nightly.  It was recommended to discontinue gabapentin 300 mg nightly by PCP as she did not gain benefit but she is not aware of this and has continued.  Symptoms previously located left occipital radiating to frontal region with short duration sharp stabbing radiating pain which appears to be secondary to possible occipital neuralgia. Previously underwent sleep study for possible OSA which was negative Continues on Eliquis and aspirin 81 mg without bleeding or bruising Continues to follow with PCP for DM management Blood pressure today 107/60 States depression/anxiety well controlled with continuation of Lexapro No further concerns at this time  Stroke admission 10/21/2018: Breanna Webster is a 74 y.o. female with history of DVT, aortic stenosis s/p TAVR in March 2020, hypothyroidism, pulmonary hypertension, HTN, CADs/p stenting December 2019 recently admitted for infective endocarditis on the mitral valve treated with 2 weeks gentamicin and ceftriaxone via PEG who presented on 10/21/2018 to the hospital with increased shortness of breath and confusion x2 weeks along with headache.  No focal neurologic symptoms.  New A. Fib with  RVR found in the ED and started on heparin drip.  Neurology consulted with work-up revealing multifocal bilateral anterior and posterior infarcts cardioembolic pattern as evidenced on MRI secondary to new onset A. fib versus recent endocarditis.  MRI showed diffuse scattered subcentimeter foci of reduced diffusion and intermediate diffusion throughout the supratentorial brain, brainstem and cerebellum including multiple lesions within the corpus callosum.  Vessel imaging unremarkable with only mild arthrosclerotic bilateral ICA bifurcations.  2D echo normal EF without mention of vegetation.  Recommended initiating D [place] with new onset of A. fib.  HTN stable.  LDL 85 and due to history of statin allergy with myalgias, statin initiation was not recommended.  A1c 7.5 and recommended follow-up with PCP for uncontrolled DM.  Other stroke risk factors include advanced age, obesity, CAD, MVR and status post TAVR 06/2018.  She was discharged home in stable condition without therapy.  She returned to ED on 11/17/2018 with complaints of sudden onset left-sided headache located left parietal occipital area at the base of her neck.  CTA unremarkable for dissection or other abnormality.  She did receive 2 migraine cocktails with improvement of headache symptoms.  She has since followed up with her PCP who initiated gabapentin along with continuation of nortriptyline 50 mg nightly.  She apparently did not gain benefit from gabapentin therefore recommended discontinuing gabapentin and nortriptyline by PCP and initiated on amitriptyline.  She did have follow-up with ophthalmologist with concerns of occipital neuralgia.        ROS:   14 system review of systems performed and negative with exception of diplopia, imbalance, dizziness and  weakness  PMH:  Past Medical History:  Diagnosis Date  . Anxiety   . Arthritis    "back" (04/22/2018)  . Back pain   . Blood transfusion without reported diagnosis   . CAD (coronary  artery disease)    a. 03/2018 s/p PCI/DES to the RCA (3.0x15 Onyx DES).  . Carotid artery stenosis    Mild  . Chest pain   . Chronic lower back pain   . Cirrhosis ([place])   . Colon polyps   . Diverticulitis   . Diverticulosis   . Esophageal thickening    seen on pre TAVR CT scan, also questionable cirrhosis. MRI recommended. Will refer to GI  . Fatty liver   . GERD (gastroesophageal reflux disease)   . Grave's disease   . History of colonic polyps 05/22/2017  . History of hiatal hernia   . Hypertension   . Hypothyroidism   . IBS (irritable bowel syndrome)   . Osteopenia   . Pulmonary nodules    seen on pre TAVR CT. likley benign. no follow up recommended if pt low risk.  . S/P TAVR (transcatheter aortic valve replacement)   . Severe aortic stenosis   . Shortness of breath on exertion   . Stroke ([place])   . Thalassemia minor   . Thyroid disease   . Type II diabetes mellitus (HCC)     PSH:  Past Surgical History:  Procedure Laterality Date  . [place] STUDY N/A 03/03/2018   Procedure: [place] STUDY;  Surgeon: Mauri Pole, MD;  Location: [facility];  Service: Endoscopy;  Laterality: N/A;  . COLONOSCOPY    . COLONOSCOPY W/ BIOPSIES AND POLYPECTOMY    . CORONARY ANGIOGRAPHY Right 04/21/2018   Procedure: CORONARY ANGIOGRAPHY (CATH LAB);  Surgeon: [name], MD;  Location: [place] [facility];  Service: Cardiovascular;  Laterality: Right;  . CORONARY STENT INTERVENTION N/A 04/22/2018   Procedure: CORONARY STENT INTERVENTION;  Surgeon: [name], MD;  Location: [place] CV LAB;  Service: Cardiovascular;  Laterality: N/A;  . DILATION AND CURETTAGE OF UTERUS    . ESOPHAGEAL MANOMETRY N/A 03/03/2018   Procedure: ESOPHAGEAL MANOMETRY (EM);  Surgeon: Mauri Pole, MD;  Location: [facility];  Service: Endoscopy;  Laterality: N/A;  . GASTRIC FUNDOPLICATION    . HERNIA REPAIR    . HYSTEROSCOPY     fibroids  . LAPAROSCOPIC CHOLECYSTECTOMY    .  LAPAROSCOPY     fibroids  . NISSEN FUNDOPLICATION  [id number]  . POLYPECTOMY    . RIGHT/LEFT HEART CATH AND CORONARY ANGIOGRAPHY N/A 02/20/2018   Procedure: RIGHT/LEFT HEART CATH AND CORONARY ANGIOGRAPHY;  Surgeon: [name], MD;  Location: [place] CV LAB;  Service: Cardiovascular;  Laterality: N/A;  . TEE WITHOUT CARDIOVERSION N/A 07/08/2018   Procedure: TRANSESOPHAGEAL ECHOCARDIOGRAM (TEE);  Surgeon: Burnell Blanks, MD;  Location: [facility];  Service: Open Heart Surgery;  Laterality: N/A;  . TEE WITHOUT CARDIOVERSION  10/07/2018  . TEE WITHOUT CARDIOVERSION N/A 10/07/2018   Procedure: TRANSESOPHAGEAL ECHOCARDIOGRAM (TEE);  Surgeon: Jerline Pain, MD;  Location[facility] END[facility]COPY;  Service: Cardiovascular;  Laterality: N/A;  . TONSILLECTOMY    . TRANSCATHETER AORTIC VALVE REPLACEMENT, TRANSFEMORAL N/A 07/08/2018   Procedure: TRANSCATHETER AORTIC VALVE REPLACEMENT, TRANSFEMORAL;  Surgeon: Burnell Blanks, MD;  Location: [facility];  Service: Open Heart Surgery;  Laterality: N/A;    Social History:  Social History   Socioeconomic History  . Marital status: Married  Spouse name: Not on file  . Number of children: 2  . Years of education: Not on file  . Highest education level: Not on file  Occupational History  . Occupation: Tree surgeon of the [employer]  Tobacco Use  . Smoking status: Never Smoker  . Smokeless tobacco: Never Used  Substance and Sexual Activity  . Alcohol use: No    Alcohol/week: 0.0 standard drinks  . Drug use: Never  . Sexual activity: Not Currently  Other Topics Concern  . Not on file  Social History Narrative   Lives with husband in a one story home.     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   Education: college.   Social Determinants of Health   Financial Resource Strain: Low Risk   . Difficulty of Paying Living Expenses: Not hard at all  Food Insecurity:     . Worried About Charity fundraiser in the Last Year:   . Arboriculturist in the Last Year:   Transportation Needs: No Transportation Needs  . Lack of Transportation (Medical): No  . Lack of Transportation (Non-Medical): No  Physical Activity:   . Days of Exercise per Week:   . Minutes of Exercise per Session:   Stress: Stress Concern Present  . Feeling of Stress : To some extent  Social Connections:   . Frequency of Communication with Friends and Family:   . Frequency of Social Gatherings with Friends and Family:   . Attends Religious Services:   . Active Member of Clubs or Organizations:   . Attends Archivist Meetings:   Marland Kitchen Marital Status:   Intimate Partner Violence:   . Fear of Current or Ex-Partner:   . Emotionally Abused:   Marland Kitchen Physically Abused:   . Sexually Abused:     Family History:  Family History  Adopted: Yes  Problem Relation Age of Onset  . Healthy Son        x 2  . Headache Other        Cluster headaches  . Heart failure Mother   . Colon cancer Neg Hx   . Pancreatic cancer Neg Hx   . Rectal cancer Neg Hx   . Stomach cancer Neg Hx     Medications:   Current Outpatient Medications on File Prior to Visit  Medication Sig Dispense Refill  . BD INSULIN SYRINGE U/F 31G X 5/16" 1 ML MISC USE TO INJECT INSULIN EVERY DAY 100 each 3  . diltiazem (CARDIZEM CD) 120 MG 24 hr capsule TAKE 1 CAPSULE(120 MG) BY MOUTH DAILY (Patient taking differently: Take 120 mg by mouth every evening. ) 90 capsule 1  . ELIQUIS 5 MG TABS tablet TAKE 1 TABLET BY MOUTH TWICE DAILY 180 tablet 1  . escitalopram (LEXAPRO) 20 MG tablet TAKE 1 TABLET(20 MG) BY MOUTH DAILY (Patient taking differently: Take 20 mg by mouth daily. ) 90 tablet 1  . ferrous sulfate 325 (65 FE) MG tablet Take 1 tablet (325 mg total) by mouth daily with breakfast. 90 tablet 1  . Lancets (ONETOUCH DELICA PLUS [id number]) MISC USE TO CHECK BLOOD SUGAR TWICE DAILY.. 200 each 0  . losartan (COZAAR) 50 MG tablet  TAKE 1 TABLET(50 MG) BY MOUTH DAILY (Patient taking differently: Take 50 mg by mouth daily. ) 90 tablet 1  . metFORMIN (GLUCOPHAGE) 500 MG tablet Take 500 mg by mouth 2 (two) times daily with a  meal.     . Multiple Vitamin (MULITIVITAMIN WITH MINERALS) TABS Take 1 tablet by mouth daily.    Marland Kitchen NOVOLIN N 100 UNIT/ML injection Inject 55 Units into the skin daily before breakfast.     . NOVOLIN R 100 UNIT/ML injection 10 UNITS THIRTY MINUTES BEFORE MEALS.  5 ADDITIONAL UNITS WITH CARBS OR SNACKS.    . ondansetron (ZOFRAN ODT) 4 MG disintegrating tablet Take 1 tablet (4 mg total) by mouth every 8 (eight) hours as needed for nausea or vomiting. 8 tablet 0  . ONETOUCH VERIO test strip USE TO MONITOR GLUCOSE LEVELS TWICE DAILY 200 strip 3  . pantoprazole (PROTONIX) 40 MG tablet Take 1 tablet (40 mg total) by mouth daily. 90 tablet 3  . Semaglutide (RYBELSUS) 3 MG TABS Take 1 tablet by mouth daily.    Marland Kitchen SYNTHROID 125 MCG tablet Take 125 mcg by mouth every morning.     No current facility-administered medications on file prior to visit.    Allergies:   Allergies  Allergen Reactions  . Statins Other (See Comments)    Muscle aches     Physical Exam  Vitals:   09/14/19 1236  BP: 125/75  Pulse: 79  Weight: 181 lb (82.1 kg)  Height: 5' 5"  (1.651 m)   Body mass index is 30.12 kg/m. No exam data present   General: well developed, well nourished, pleasant elderly Caucasian female, seated, in no evident distress Head: head normocephalic and atraumatic.   Neck: supple with no carotid or supraclavicular bruits Cardiovascular: regular rate and rhythm, no murmurs Musculoskeletal: no deformity Skin:  no rash/petichiae Vascular:  Normal pulses all extremities   Neurologic Exam Mental Status: Awake and fully alert.   Fluent speech and language.  Oriented to place and time. Recent memory mildly impaired and remote memory intact.  Attention span, concentration and fund of knowledge mostly appropriate  with occasional difficulty such as repeating questions. Mood and affect appropriate.  Cranial Nerves: Fundoscopic exam reveals sharp disc margins. Pupils equal, briskly reactive to light. Extraocular movements vertical slow beat nystagmus towards the left with saccadic movement.  Visual fields full to confrontation. Hearing intact. Facial sensation intact.  Mild left lower facial weakness Motor: Normal bulk and tone.  Full strength in all tested extremities except slight decreased left hand dexterity Sensory.: intact to touch , pinprick , position and vibratory sensation.  Coordination: Rapid alternating movements normal in all extremities except slightly decreased left hand. Finger-to-nose showed and heel-to-shin performed accurately bilaterally. Gait and Station: Arises from chair with mild difficulty. Stance is normal. Gait demonstrates  normal stride length with mild imbalance greater with turns.  No assistive device.  Able to heel toe with mild difficulty.  Romberg positive. Reflexes: 1+ and symmetric. Toes downgoing.       ASSESSMENT: Breanna Webster is a 74 y.o. year old female presented with increased shortness of breath and confusion x2 weeks along with headache on 10/21/2018 with recent admission on 10/03/2018 for sepsis and mitral valve endocarditis.  Stroke work-up revealed multifocal bilateral anterior and posterior infarcts cardioembolic pattern secondary to new onset A. fib versus recent endocarditis. Vascular risk factors include history of DVT, aortic stenosis status post TAVR 06/2018, pulmonary hypertension, HTN, DM, CAD status post stenting 03/2018, recent endocarditis and new onset A. fib.  Residual deficits of mild RUE decreased dexterity, imbalance, dizziness, cognitive impairment and intermittent diplopia.  Returns today per patient request due to concern of increased frequency of diplopia and dizziness episodes  PLAN:  1. Worsening prior stroke symptoms: ddx recurrent stroke  vs recrudescence of prior stroke symptoms vs ongoing residual stroke deficits vs vestibular syndrome.  Unable to fully determine if symptoms have actually worsened or simply not improved.  Due to extensive underlying history and increased risk for recurrent stroke, recommend obtaining MRI wo brain to rule out stroke.  Referral placed to PT for vestibular rehab.  She was advised not to drive due to safety concerns.  She was advised to proceed to ED with any new onset or worsening stroke/TIA symptoms. 2. Hx multiple Cardiologic strokes including cerebellar and pontine stroke: Continue Eliquis (apixaban) daily  for secondary stroke prevention.  Maintain strict control of hypertension with blood pressure goal below 130/90, diabetes with hemoglobin A1c goal below 6.5% and cholesterol with LDL cholesterol (bad cholesterol) goal below 70 mg/dL.  I also advised the patient to eat a healthy diet with plenty of whole grains, cereals, fruits and vegetables, exercise regularly with at least 30 minutes of continuous activity daily and maintain ideal body weight. 3. Atrial fibrillation: continue eliquis and follow up with cardiology  4. HTN: Stable at today's visit.  Continue to follow with PCP for monitoring and management 5. DMII: Continue to follow PCP for monitoring management   Follow-up in 3 months or call earlier if needed   I spent 40 minutes of face-to-face and non-face-to-face time with patient and husband.  This included previsit chart review, lab review, study review, order entry, electronic health record documentation, patient education    [name], [facility]  [facility] [address] [place], [place] [address]  Phone [phone] Fax [phone] Note: This document was prepared with digital dictation and possible smart phrase technology. Any transcriptional errors that result from this process are unintentional.

## 2019-09-14 NOTE — Patient Instructions (Addendum)
Your Plan:  Obtain MRI wo contrast to rule out new stroke  Suspicion for worsening of prior stroke symptoms, recommend doing vestbular rehab -referral will be placed and you will be called to schedule initial evaluation  Please refrain from driving due to dizziness and double vision   Follow-up in 3 months or call earlier if needed     Thank you for coming to see [name] at [facility]. I hope we have been able to provide you high quality care today.  You may receive a patient satisfaction survey over the next few weeks. We would appreciate your feedback and comments so that we may continue to improve ourselves and the health of our patients.

## 2019-09-15 ENCOUNTER — Telehealth: Payer: Self-pay | Admitting: Gastroenterology

## 2019-09-15 ENCOUNTER — Ambulatory Visit: Payer: Medicare Other | Admitting: Physical Therapy

## 2019-09-15 NOTE — Telephone Encounter (Signed)
Dr. Havery Moros, please review her ED visit.  No APP or appts with you until the second week of June.  Please advise

## 2019-09-15 NOTE — Progress Notes (Signed)
I agree with the above plan 

## 2019-09-15 NOTE — Telephone Encounter (Signed)
Pt called again stating that she would like to see Dr. Havery Moros asap. She stated that she went to the ED two days ago and does not want to go back again. Pls call her.

## 2019-09-15 NOTE — Telephone Encounter (Signed)
I called the patient to address her symptoms, no answer, left message, will call back.

## 2019-09-16 ENCOUNTER — Other Ambulatory Visit: Payer: Self-pay | Admitting: Gastroenterology

## 2019-09-16 DIAGNOSIS — K219 Gastro-esophageal reflux disease without esophagitis: Secondary | ICD-10-CM

## 2019-09-16 MED ORDER — SUCRALFATE 1 GM/10ML PO SUSP: 1.0000 g | Freq: Four times a day (QID) | ORAL | 1 refills | Status: DC

## 2019-09-16 NOTE — Telephone Encounter (Signed)
Pt said she is returning your call [phone]

## 2019-09-16 NOTE — Telephone Encounter (Signed)
Called the patient.  She has been experiencing epigastric discomfort, constipation, nausea for the past 2 days.  Seen in the ER with stable labs and a CT scan which showed some questionable esophagitis, hard to say for sure.  She does have a history of Nissen, and her last EGD on Protonix showed interval healing of esophagitis.  She has started Rybelsus in the past few weeks and concerned about a potential reaction to that.  I agree that the Rybelsus could certainly cause the symptoms, and given this occurrence I recommend she stop it and contact the prescribing provider to see if she can manage her diabetes with another regimen while we get these other symptoms under control.  She agreed and will stop the Rybelsus and call her prescribing provider.  I recommend we give her a trial of some liquid Carafate 10 cc every 6 hours as needed to coat her esophagus and see if that makes her feel any better.  Otherwise she has been quite constipated lately and bloated.  Recommend she use MiraLAX few times a day and titrate up or down as needed.  We will see how she does over the next few days.  If no improvement I asked her to contact me for reassessment.  She agreed

## 2019-09-16 NOTE — Telephone Encounter (Signed)
Pt is returning your call [phone]

## 2019-09-16 NOTE — Telephone Encounter (Signed)
Pt said she has to run out but can be reached on her cell phone @ [phone]

## 2019-09-22 DIAGNOSIS — E039 Hypothyroidism, unspecified: Secondary | ICD-10-CM | POA: Diagnosis not present

## 2019-09-22 DIAGNOSIS — Z6831 Body mass index (BMI) 31.0-31.9, adult: Secondary | ICD-10-CM | POA: Diagnosis not present

## 2019-09-22 DIAGNOSIS — I4891 Unspecified atrial fibrillation: Secondary | ICD-10-CM | POA: Diagnosis not present

## 2019-09-22 DIAGNOSIS — Z794 Long term (current) use of insulin: Secondary | ICD-10-CM | POA: Diagnosis not present

## 2019-09-22 DIAGNOSIS — Z8673 Personal history of transient ischemic attack (TIA), and cerebral infarction without residual deficits: Secondary | ICD-10-CM | POA: Diagnosis not present

## 2019-09-22 DIAGNOSIS — Z952 Presence of prosthetic heart valve: Secondary | ICD-10-CM | POA: Diagnosis not present

## 2019-09-22 DIAGNOSIS — E1149 Type 2 diabetes mellitus with other diabetic neurological complication: Secondary | ICD-10-CM | POA: Diagnosis not present

## 2019-09-22 DIAGNOSIS — I2510 Atherosclerotic heart disease of native coronary artery without angina pectoris: Secondary | ICD-10-CM | POA: Diagnosis not present

## 2019-09-23 ENCOUNTER — Encounter: Payer: Self-pay | Admitting: *Deleted

## 2019-09-23 ENCOUNTER — Telehealth: Payer: Self-pay | Admitting: Adult Health

## 2019-09-23 ENCOUNTER — Other Ambulatory Visit: Payer: Self-pay | Admitting: *Deleted

## 2019-09-23 NOTE — Patient Outreach (Signed)
[name]  [name]  09/23/2019   Breanna Webster 21-Dec-1945 [id number]  Subjective: Successful telephone outreach call to patient. HIPAA identifiers obtained. Patient states she has had some recent health concerns. She is experiencing abdominal pain and presented to the ED on  09/11/19. Patient is being followed by Dr. Havery Moros; she reports a decrease in appetite and continues to experience pain but it is not as severe. Patient explained that she recently followed up with neurology about concerns of diplopia and being off balance. Neurology has ordered a MRI to evaluate due to her history of stroke and patient is waiting for the appointment call. Patient also reports that she has seen her ophthalmologist regarding the diplopia. Patient denies falls and states that she is able to perform all ADLs and IADLs except for driving due to her ongoing neurological symptoms. Her FBS today was 139 and her last A1c was 7.5. Patient explained that her blood sugar dropped to 45 from her not eating due to her poor appetite This usually happens between lunch and dinner and not during her sleeping hours. Her postprandial values range from 45-160.  Nurse encouraged patient to drink an Ensure daily between lunch and dinner and to eat small frequent meals with protein to prevent hypoglycemia. Patient states she does walk routinely about 20 minutes daily and that she is well supported by her husband [name]. She did not have any further questions or concerns at this time.   Encounter Medications:  Outpatient Encounter Medications as of 09/23/2019  Medication Sig Note  . BD INSULIN SYRINGE U/F 31G X 5/16" 1 ML MISC USE TO INJECT INSULIN EVERY DAY   . diltiazem (CARDIZEM CD) 120 MG 24 hr capsule TAKE 1 CAPSULE(120 MG) BY MOUTH DAILY (Patient taking differently: Take 120 mg by mouth every evening. )   . ELIQUIS 5 MG TABS tablet TAKE 1 TABLET BY MOUTH TWICE DAILY   . escitalopram  (LEXAPRO) 20 MG tablet TAKE 1 TABLET(20 MG) BY MOUTH DAILY (Patient taking differently: Take 20 mg by mouth daily. )   . Lancets (ONETOUCH DELICA PLUS [id number]) MISC USE TO CHECK BLOOD SUGAR TWICE DAILY..   . losartan (COZAAR) 50 MG tablet TAKE 1 TABLET(50 MG) BY MOUTH DAILY (Patient taking differently: Take 50 mg by mouth daily. )   . metFORMIN (GLUCOPHAGE) 500 MG tablet Take 500 mg by mouth 2 (two) times daily with a meal.    . Multiple Vitamin (MULITIVITAMIN WITH MINERALS) TABS Take 1 tablet by mouth daily.   Marland Kitchen NOVOLIN N 100 UNIT/ML injection Inject 55 Units into the skin daily before breakfast.    . NOVOLIN R 100 UNIT/ML injection 10 UNITS THIRTY MINUTES BEFORE MEALS.  5 ADDITIONAL UNITS WITH CARBS OR SNACKS.   . ondansetron (ZOFRAN ODT) 4 MG disintegrating tablet Take 1 tablet (4 mg total) by mouth every 8 (eight) hours as needed for nausea or vomiting.   [name] VERIO test strip USE TO MONITOR GLUCOSE LEVELS TWICE DAILY   . pantoprazole (PROTONIX) 40 MG tablet Take 1 tablet (40 mg total) by mouth daily.   . sucralfate (CARAFATE) 1 GM/10ML suspension Take 10 mLs (1 g total) by mouth 4 (four) times daily.   Marland Kitchen SYNTHROID 125 MCG tablet Take 125 mcg by mouth every morning.   . ferrous sulfate 325 (65 FE) MG tablet Take 1 tablet (325 mg total) by mouth daily with breakfast. (Patient not taking: Reported on 09/23/2019) 09/23/2019: Due to constipation  .  Semaglutide (RYBELSUS) 3 MG TABS Take 1 tablet by mouth daily. 09/23/2019: Unable to tolerate caused abdominal pain   No facility-administered encounter medications on file as of 09/23/2019.    Functional Status:  In your present state of health, do you have any difficulty performing the following activities: 09/23/2019  Hearing? N  Vision? N  Difficulty concentrating or making decisions? Y  Comment per patient poor STM since she had a stroke  Walking or climbing stairs? N  Dressing or bathing? N  Doing errands, shopping? N  Preparing Food and  eating ? N  Using the Toilet? N  In the past six months, have you accidently leaked urine? Y  Comment after my stroke I have had leaking  Do you have problems with loss of bowel control? N  Managing your Medications? N  Managing your Finances? N  Housekeeping or managing your Housekeeping? N  Some recent data might be hidden    Fall/Depression Screening: Fall Risk  09/23/2019 11/18/2018 10/28/2018  Falls in the past year? 0 0 0  Number falls in past yr: 0 - 0  Injury with Fall? 0 - 0  Risk for fall due to : Impaired balance/gait;Impaired vision Impaired balance/gait;Impaired vision;Other (Comment) -  Risk for fall due to: Comment reports double vision and dizziness at times recent stroke -  Follow up Falls prevention discussed;Education provided;Falls evaluation completed Falls evaluation completed Falls prevention discussed   PHQ 2/9 Scores 09/23/2019 08/25/2019 12/10/2018 10/28/2018 07/31/2017 06/14/2017 11/21/2016  PHQ - 2 Score 1 0 0 0 0 0 4  PHQ- 9 Score - 4 - - - - 8   [facility] CM Care Plan Problem One     Most Recent Value  Care Plan Problem One  Knowledge deficit related to diabetes treatment plan and lifestyle changes.  Role Documenting the Problem One  [place] for Problem One  Active  [facility] Long Term Goal   Patient will report reduction in A1c by 0.5-1 points in the next 90 days  [facility] Long Term Goal Start Date  09/23/19  Interventions for Problem One Long Term Goal  encouraged patient to continue with daily FBS and postprandial CBG monitoring and record data, encouraged medication adherence, discussed reflecting back to diet when FBS values are high, sent planning healthy meals booklet, encouraged patient to continue to walk routinely,discussed eating frequent small meals with protein and to drink Ensure to prevent hypoglycemia due to poor appetite, encouraged patient to continue with [facility] program to control her weight.     Plan: Nurse will send PCP a  Barrier Letter and today's assessment note, she will send Patient a [employer] booklet. RN Health Coach will call patient within the month of July and patient agrees to future outreach calls.   [name] RN, BSN [place] [phone] [email or web address]

## 2019-09-23 NOTE — Telephone Encounter (Signed)
Medicare/[place]BS [place] Auth: [place] Ref # [id number] order faxed to [facility]. they will reach out to the patient to schedule,.

## 2019-09-23 NOTE — Telephone Encounter (Signed)
Her call back number is [phone].

## 2019-09-23 NOTE — Telephone Encounter (Signed)
Still having the abdominal pain. Did stop the Rybelsus although she doesn't believe that could of been causing it. Is not sure what else to do to get rid of the pain. Please advice.

## 2019-09-23 NOTE — Telephone Encounter (Signed)
Left message for patient to call back  

## 2019-09-23 NOTE — Progress Notes (Signed)
Letter sent to pt to go for labs for Dr. Havery Moros

## 2019-09-23 NOTE — Telephone Encounter (Signed)
Patient returned the call requested a call back please

## 2019-09-24 ENCOUNTER — Telehealth: Payer: Self-pay | Admitting: Adult Health

## 2019-09-24 NOTE — Telephone Encounter (Signed)
I am not sure what she means in regards to her " new heart valve" as she underwent TAVR procedure in 06/2018.  Not aware of any contraindications to obtaining MRI with heart valve.  She has since underwent MRI which was last completed 10/2018.  I am also not sure why there would be a difference between having an open MRI versus regular MRI.  Are we were able to contact [facility] for further information?  Thank you.

## 2019-09-24 NOTE — Telephone Encounter (Signed)
Pt called to advise [facility] contacted her in regards to Mri pt states she is not able to get in for the open MRI due to having a new heart valve. Pt would like to know if she could be scheduled with [facility] imaging for MRI pt would like to know if she can never have an open MRI due to the valve. Pt requested a CB to discuss

## 2019-09-25 ENCOUNTER — Telehealth: Payer: Self-pay | Admitting: Gastroenterology

## 2019-09-25 NOTE — Telephone Encounter (Signed)
I have not called the patient. I Sent pt a message that I did not call her but she is due for labs and I sent her a letter.

## 2019-09-25 NOTE — Telephone Encounter (Signed)
Left message for patient to call back  

## 2019-09-28 NOTE — Telephone Encounter (Signed)
No return call from the patient.

## 2019-09-28 NOTE — Telephone Encounter (Signed)
I sent a message to Breanna Webster at [facility] if she would be able to have the MRI there with the new heart valve that she underwent TAVR.

## 2019-09-28 NOTE — Telephone Encounter (Signed)
Emailed Olin Hauser some of the information for her to forward to the Tech to see if it is safe.

## 2019-09-29 ENCOUNTER — Other Ambulatory Visit (INDEPENDENT_AMBULATORY_CARE_PROVIDER_SITE_OTHER): Payer: Medicare Other

## 2019-09-29 ENCOUNTER — Telehealth: Payer: Self-pay | Admitting: Gastroenterology

## 2019-09-29 DIAGNOSIS — K7460 Unspecified cirrhosis of liver: Secondary | ICD-10-CM | POA: Diagnosis not present

## 2019-09-29 DIAGNOSIS — D508 Other iron deficiency anemias: Secondary | ICD-10-CM

## 2019-09-29 LAB — PROTIME-INR
INR: 1.7 ratio — ABNORMAL HIGH (ref 0.8–1.0)
Prothrombin Time: 19.4 s — ABNORMAL HIGH (ref 9.6–13.1)

## 2019-09-29 LAB — IBC + FERRITIN
Ferritin: 251.9 ng/mL (ref 10.0–291.0)
Iron: 134 ug/dL (ref 42–145)
Saturation Ratios: 42.5 % (ref 20.0–50.0)
Transferrin: 225 mg/dL (ref 212.0–360.0)

## 2019-09-29 LAB — CBC WITH DIFFERENTIAL/PLATELET
Basophils Absolute: 0.1 10*3/uL (ref 0.0–0.1)
Basophils Relative: 1.1 % (ref 0.0–3.0)
Eosinophils Absolute: 0.1 10*3/uL (ref 0.0–0.7)
Eosinophils Relative: 2.2 % (ref 0.0–5.0)
HCT: 32.9 % — ABNORMAL LOW (ref 36.0–46.0)
Hemoglobin: 10.6 g/dL — ABNORMAL LOW (ref 12.0–15.0)
Lymphocytes Relative: 16.7 % (ref 12.0–46.0)
Lymphs Abs: 1.1 10*3/uL (ref 0.7–4.0)
MCHC: 32.1 g/dL (ref 30.0–36.0)
MCV: 64.8 fl — ABNORMAL LOW (ref 78.0–100.0)
Monocytes Absolute: 0.4 10*3/uL (ref 0.1–1.0)
Monocytes Relative: 6.5 % (ref 3.0–12.0)
Neutro Abs: 4.9 10*3/uL (ref 1.4–7.7)
Neutrophils Relative %: 73.5 % (ref 43.0–77.0)
Platelets: 172 10*3/uL (ref 150.0–400.0)
RBC: 5.08 Mil/uL (ref 3.87–5.11)
RDW: 15.3 % (ref 11.5–15.5)
WBC: 6.7 10*3/uL (ref 4.0–10.5)

## 2019-09-29 LAB — HEPATIC FUNCTION PANEL
ALT: 41 U/L — ABNORMAL HIGH (ref 0–35)
AST: 36 U/L (ref 0–37)
Albumin: 4.2 g/dL (ref 3.5–5.2)
Alkaline Phosphatase: 75 U/L (ref 39–117)
Bilirubin, Direct: 0.3 mg/dL (ref 0.0–0.3)
Total Bilirubin: 1.1 mg/dL (ref 0.2–1.2)
Total Protein: 7 g/dL (ref 6.0–8.3)

## 2019-09-29 NOTE — Telephone Encounter (Signed)
Patient reports that she stopped the Rybelsus about a week ago.  She has continued abdominal pain periumbilical and right sided abdominal pain.  She states that she has had 2 stools that were loose in the last week. She has a follow up with Alonza Bogus, PA the end of June.  She wants to know where the pain is coming from?  Please advise

## 2019-09-30 LAB — AFP TUMOR MARKER: AFP-Tumor Marker: 2.6 ng/mL

## 2019-09-30 NOTE — Telephone Encounter (Signed)
Breanna Webster please let her know that her CT scan looked okay and her labs just done yesterday all look okay, I don't see anything concerning there. Sorry to hear she is not feeling better but need to sit down and examine her if pain is the main issue to better understand where her pain is coming from. I had recommended carafate and Miralax when I last spoke with her, not sure if she is taking that. She should keep her appointment if anything opens up sooner we can try to fit her in or add to the schedule. Thanks

## 2019-09-30 NOTE — Telephone Encounter (Signed)
Schedule at GI for 10/05/19.

## 2019-10-01 NOTE — Telephone Encounter (Signed)
Pt called and stated she was returning your call

## 2019-10-01 NOTE — Telephone Encounter (Signed)
Patient notified

## 2019-10-01 NOTE — Telephone Encounter (Signed)
Left message for patient to call back  

## 2019-10-05 ENCOUNTER — Ambulatory Visit (INDEPENDENT_AMBULATORY_CARE_PROVIDER_SITE_OTHER): Payer: Medicare Other | Admitting: Family Medicine

## 2019-10-05 ENCOUNTER — Ambulatory Visit
Admission: RE | Admit: 2019-10-05 | Discharge: 2019-10-05 | Disposition: A | Discharge status: Home / Self Care | Payer: Medicare Other | Source: Ambulatory Visit | Attending: Adult Health | Admitting: Adult Health

## 2019-10-05 ENCOUNTER — Other Ambulatory Visit: Payer: Self-pay

## 2019-10-05 ENCOUNTER — Encounter (INDEPENDENT_AMBULATORY_CARE_PROVIDER_SITE_OTHER): Payer: Self-pay | Admitting: Family Medicine

## 2019-10-05 VITALS — BP 117/63 | HR 67 | Temp 98.0°F | Ht 65.0 in | Wt 179.0 lb

## 2019-10-05 DIAGNOSIS — R109 Unspecified abdominal pain: Secondary | ICD-10-CM

## 2019-10-05 DIAGNOSIS — I2510 Atherosclerotic heart disease of native coronary artery without angina pectoris: Secondary | ICD-10-CM

## 2019-10-05 DIAGNOSIS — Z794 Long term (current) use of insulin: Secondary | ICD-10-CM

## 2019-10-05 DIAGNOSIS — R609 Edema, unspecified: Secondary | ICD-10-CM

## 2019-10-05 DIAGNOSIS — Z6830 Body mass index (BMI) 30.0-30.9, adult: Secondary | ICD-10-CM | POA: Diagnosis not present

## 2019-10-05 DIAGNOSIS — E119 Type 2 diabetes mellitus without complications: Secondary | ICD-10-CM

## 2019-10-05 DIAGNOSIS — K7469 Other cirrhosis of liver: Secondary | ICD-10-CM | POA: Diagnosis not present

## 2019-10-05 DIAGNOSIS — E669 Obesity, unspecified: Secondary | ICD-10-CM

## 2019-10-05 DIAGNOSIS — I69398 Other sequelae of cerebral infarction: Secondary | ICD-10-CM

## 2019-10-05 DIAGNOSIS — R42 Dizziness and giddiness: Secondary | ICD-10-CM

## 2019-10-05 DIAGNOSIS — K5909 Other constipation: Secondary | ICD-10-CM

## 2019-10-05 DIAGNOSIS — K2270 Barrett's esophagus without dysplasia: Secondary | ICD-10-CM

## 2019-10-05 DIAGNOSIS — Z8673 Personal history of transient ischemic attack (TIA), and cerebral infarction without residual deficits: Secondary | ICD-10-CM

## 2019-10-05 DIAGNOSIS — K209 Esophagitis, unspecified without bleeding: Secondary | ICD-10-CM

## 2019-10-06 NOTE — Progress Notes (Signed)
Chief Complaint:   OBESITY Karlyn is here to discuss her progress with her obesity treatment plan along with follow-up of her obesity related diagnoses. Zada is on the [employer] and states she is following her eating plan approximately 75% of the time. Jimena states she is walking for 20 minutes 3 times per week.  Today's visit was #: 3 Starting weight: 184 lbs Starting date: 08/25/2019 Today's weight: 179 lbs Today's date: 10/05/2019 Total lbs lost to date: 5 lbs Total lbs lost since last in-office visit: 1 lb  Interim History: Eloise is frustrated with the lack of scale movement.  We discussed her weight distribution, mostly abdominal, with known cirrhosis.  CT abdomen completed last week was reviewed.  She will be seeing a GI doctor in a few weeks.    Subjective:   1. Type 2 diabetes mellitus without complication, with long-term current use of insulin (HCC) Medications reviewed. Diabetic ROS: no polyuria or polydipsia, no chest pain, dyspnea or TIA's, no numbness, tingling or pain in extremities.  She is followed by Dr. [name].  Her blood sugar was down to 46 last week.  Lab Results  Component Value Date   HGBA1C 7.5 (H) 10/22/2018   HGBA1C 8.5 (H) 10/05/2018   HGBA1C 9.3 (H) 07/07/2018   Lab Results  Component Value Date   MICROALBUR 0.8 10/07/2015   LDLCALC 85 10/22/2018   CREATININE 0.72 09/11/2019   Lab Results  Component Value Date   INSULIN 76.3 (H) 11/21/2016   2. Abdominal pain, unspecified abdominal location Lore is being followed by Dr. Havery Moros for this.  3. Other cirrhosis of liver, with fatty liver disease Mirren has the diagnosis of fatty liver disease. Her BMI is 29.8. She denies abdominal pain or jaundice and has never been told of any liver problems in the past. She denies excessive alcohol intake.  Lab Results  Component Value Date   ALT 41 (H) 09/29/2019   AST 36 09/29/2019   ALKPHOS 75 09/29/2019   BILITOT 1.1 09/29/2019   4. Barrett's  esophagus with esophagitis Dayane has stopped taking Rybelsus to help with these symptoms.  5. Other constipation Brigida says her constipation resolved with increased fiber.  6. Edema, unspecified type She was previously on HCTZ and stopped due to incontinence.    Assessment/Plan:   1. Type 2 diabetes mellitus without complication, with long-term current use of insulin (HCC) Good blood sugar control is important to decrease the likelihood of diabetic complications such as nephropathy, neuropathy, limb loss, blindness, coronary artery disease, and death. Intensive lifestyle modification including diet, exercise and weight loss are the first line of treatment for diabetes.   2. Abdominal pain Will continue to monitor.  3. Other cirrhosis of liver, with fatty liver disease We discussed the likely diagnosis of non-alcoholic fatty liver disease today and how this condition is obesity related. Rozalia was educated the importance of weight loss. Anora agreed to continue with her weight loss efforts with healthier diet and exercise as an essential part of her treatment plan.  4. Barrett's esophagus with esophagitis Will continue to follow along.  5. Other constipation Milena was informed that a decrease in bowel movement frequency is normal while losing weight, but stools should not be hard or painful. Orders and follow up as documented in patient record.   Counseling Getting to Good Bowel Health: Your goal is to have one soft bowel movement each day. Drink at least 8 glasses of water each day. Eat plenty of  fiber (goal is over 25 grams each day). It is best to get most of your fiber from dietary sources which includes leafy green vegetables, fresh fruit, and whole grains. You may need to add fiber with the help of OTC fiber supplements. These include Metamucil, Citrucel, and Flaxseed. If you are still having trouble, try adding Miralax or Magnesium Citrate. If all of these changes do not work, Investment banker, operational.  6. Edema, unspecified type Question starting spironolactone/Lasix.  7. Class 1 obesity with serious comorbidity and body mass index (BMI) of 30.0 to 30.9 in adult, unspecified obesity type Atticus is currently in the action stage of change. As such, her goal is to continue with weight loss efforts. She has agreed to the [employer].   Exercise goals: Older adults should follow the adult guidelines. When older adults cannot meet the adult guidelines, they should be as physically active as their abilities and conditions will allow.  Older adults should do exercises that maintain or improve balance if they are at risk of falling.   Behavioral modification strategies: increasing lean protein intake and increasing water intake.  Kerrilynn has agreed to follow-up with our clinic in 3 weeks. She was informed of the importance of frequent follow-up visits to maximize her success with intensive lifestyle modifications for her multiple health conditions.   Objective:   Blood pressure 117/63, pulse 67, temperature 98 F (36.7 C), temperature source Oral, height 5' 5"  (1.651 m), weight 179 lb (81.2 kg), SpO2 97 %. Body mass index is 29.79 kg/m.  General: Cooperative, alert, well developed, in no acute distress. HEENT: Conjunctivae and lids unremarkable. Cardiovascular: Regular rhythm.  Lungs: Normal work of breathing. Neurologic: No focal deficits.   Lab Results  Component Value Date   CREATININE 0.72 09/11/2019   BUN 18 09/11/2019   NA 139 09/11/2019   K 4.2 09/11/2019   CL 103 09/11/2019   CO2 27 09/11/2019   Lab Results  Component Value Date   ALT 41 (H) 09/29/2019   AST 36 09/29/2019   ALKPHOS 75 09/29/2019   BILITOT 1.1 09/29/2019   Lab Results  Component Value Date   HGBA1C 7.5 (H) 10/22/2018   HGBA1C 8.5 (H) 10/05/2018   HGBA1C 9.3 (H) 07/07/2018   HGBA1C 7.4 (A) 04/07/2018   HGBA1C 9.4 (A) 02/04/2018   Lab Results  Component Value Date   INSULIN 76.3 (H)  11/21/2016   Lab Results  Component Value Date   TSH 14.81 (A) 05/11/2019   Lab Results  Component Value Date   CHOL 128 10/22/2018   HDL 24 (L) 10/22/2018   LDLCALC 85 10/22/2018   LDLDIRECT 98.0 05/24/2014   TRIG 95 10/22/2018   CHOLHDL 5.3 10/22/2018   Lab Results  Component Value Date   WBC 6.7 09/29/2019   HGB 10.6 (L) 09/29/2019   HCT 32.9 (L) 09/29/2019   MCV 64.8 Repeated and verified X2. (L) 09/29/2019   PLT 172.0 09/29/2019   Lab Results  Component Value Date   IRON 134 09/29/2019   TIBC 305 08/14/2019   FERRITIN 251.9 09/29/2019   Obesity Behavioral Intervention:   Approximately 15 minutes were spent on the discussion below.  ASK: We discussed the diagnosis of obesity with Sharee Pimple today and Shatoya agreed to give [name] permission to discuss obesity behavioral modification therapy today.  ASSESS: Marylu has the diagnosis of obesity and her BMI today is 29.8. Kira is in the action stage of change.   ADVISE: Accalia was educated on the  multiple health risks of obesity as well as the benefit of weight loss to improve her health. She was advised of the need for long term treatment and the importance of lifestyle modifications to improve her current health and to decrease her risk of future health problems.  AGREE: Multiple dietary modification options and treatment options were discussed and Faron agreed to follow the recommendations documented in the above note.  ARRANGE: Brigid was educated on the importance of frequent visits to treat obesity as outlined per CMS and USPSTF guidelines and agreed to schedule her next follow up appointment today.  Attestation Statements:   Reviewed by clinician on day of visit: allergies, medications, problem list, medical history, surgical history, family history, social history, and previous encounter notes.  I, Water quality scientist, CMA, am acting as transcriptionist for Briscoe Deutscher, DO  I have reviewed the above documentation for accuracy and  completeness, and I agree with the above. Briscoe Deutscher, DO

## 2019-10-07 ENCOUNTER — Telehealth: Payer: Self-pay

## 2019-10-07 NOTE — Telephone Encounter (Signed)
-----   Message from [name], NP sent at 10/07/2019  8:51 AM EDT ----- Please advise patient that recent MRI did not show any new abnormalities.  Please advised to participate in PT for worsening prior stroke deficits

## 2019-10-07 NOTE — Telephone Encounter (Signed)
Pt was contacted and notified of the message below. Pt said she will hav the progress notes faxed over to [place].

## 2019-10-07 NOTE — Telephone Encounter (Signed)
-----   Message from [name], NP sent at 10/07/2019  2:58 PM EDT ----- Regarding: RE: additional questions after results Typically, ophthalmologist will provide clearance in regards to return to driving in regards to visual concerns.  Please request patient have recent office notes from ophthalmology be faxed to office for further review.  If her ophthalmologist is not able to provide recommendations in regards to driving, would recommend driving evaluation for further clearance  ----- Message ----- From: Elita Quick, CMA Sent: 10/07/2019  11:34 AM EDT To: [name], NP Subject: additional questions after results             Pt was contacted and notified of the msg below. She dies have additional questions. Pt said she got her eyes checked and the double vision is not relate vision problems. Pt also want to know about her driving or when she can drive again.  ----- Message ----- From: [name], NP Sent: 10/07/2019   8:51 AM EDT To: Andre Lefort, CMA, Elita Quick, CMA  Please advise patient that recent MRI did not show any new abnormalities.  Please advised to participate in PT for worsening prior stroke deficits

## 2019-10-07 NOTE — Telephone Encounter (Signed)
Attempted to call pt, Breanna Webster.

## 2019-10-14 ENCOUNTER — Other Ambulatory Visit: Payer: Self-pay | Admitting: Adult Health

## 2019-10-19 ENCOUNTER — Other Ambulatory Visit: Payer: Self-pay

## 2019-10-19 ENCOUNTER — Ambulatory Visit: Payer: Medicare Other

## 2019-10-19 DIAGNOSIS — I480 Paroxysmal atrial fibrillation: Secondary | ICD-10-CM

## 2019-10-19 DIAGNOSIS — E039 Hypothyroidism, unspecified: Secondary | ICD-10-CM

## 2019-10-19 DIAGNOSIS — I10 Essential (primary) hypertension: Secondary | ICD-10-CM

## 2019-10-19 DIAGNOSIS — E119 Type 2 diabetes mellitus without complications: Secondary | ICD-10-CM

## 2019-10-19 DIAGNOSIS — K2270 Barrett's esophagus without dysplasia: Secondary | ICD-10-CM

## 2019-10-19 DIAGNOSIS — Z794 Long term (current) use of insulin: Secondary | ICD-10-CM

## 2019-10-19 DIAGNOSIS — E785 Hyperlipidemia, unspecified: Secondary | ICD-10-CM

## 2019-10-19 DIAGNOSIS — K209 Esophagitis, unspecified without bleeding: Secondary | ICD-10-CM

## 2019-10-19 NOTE — Chronic Care Management (AMB) (Signed)
Chronic Care Management Pharmacy  Name: Breanna Webster  MRN: [id number] DOB: [birth year]  Initial Questions: 1. Have you seen any other providers since your last visit? NA 2. Any changes in your medicines or health? Yes    Chief Complaint/ HPI  [name],  74 y.o. , female presents for their Initial CCM visit with the clinical pharmacist In office.  Patient stated she is doing well after stroke. She notes she is still dealing with lingering effects. Patient would like to focus on medications that can cause non-alcoholic fatty liver and stop taking those medications.   PCP : [name], NP  Their chronic conditions include: Afib, DM, HTN, HLD, hypothyroidism, Barrett's esophagus, GERD, Anemia, Headache/ sleep  Office Visits: 07/31/2019- [name], NP- Patient presented for virtual visit for concern of UTI. Plan: urine culture. Patient to start ciprofloxacin 520m, 1 tablet BID for 10 days.   Consult Visit: 10/05/2019- Weight management center-  EBriscoe Deutscher DO- Patient presented for office visit to discuss progress on obesity treatment plan. No major interventions done. Patient to continue Pescatarian diet plan. Patient to return in 3 weeks.   09/14/2019- Neurology- JFrann Rider NP- Patient presented for office visit for stroke follow up. Patient presented with symptoms of intermittent double vision, imbalance, dizziness, and left hand shaking. Patient to obtain brain MRI to rule out stroke. No other interventions/ changes. Patient to follow up in 3 months.   08/18/2019- Gastroenterology- Dr. SCarolina Cellar MD- Patient presented for office visit for follow up visit. For iron deficiency anemia, patient received IV iron x2 in the past, and was recommended to take ferrous sulfate 3279mdaily. For loose stools, patient to speak with prescriber about Rylbesus. Patient referred to pelvic floor PT. Patient to follow up in 6 months.  08/14/2019- Oncology- Dr. GaBrunetta GeneraMD- Patient presented for office visit for thalassemia minor and IDA.  Blood work discussed with patient. Patient was recommended to f/u with PCP, take multi-vitamin and vitamin D3. Follow up in 6 months.   07/01/2019- Cardiology- KaAngelena FormPA- Patient presented for office visit for 1 year s/p TAVR. Patient instructed she can stop aspirin at this time. No other interventions. Patient to follow up in 6 months.   05/26/2019- Pulmonology- MiChristinia GullyMD-   Medications: Outpatient Encounter Medications as of 10/19/2019  Medication Sig Note  . amitriptyline (ELAVIL) 25 MG tablet Take 25 mg by mouth at bedtime.   . BD INSULIN SYRINGE U/F 31G X 5/16" 1 ML MISC USE TO INJECT INSULIN EVERY DAY   . Cholecalciferol (VITAMIN D3 PO) Take 1 tablet by mouth daily.   . Marland Kitcheniltiazem (CARDIZEM CD) 120 MG 24 hr capsule TAKE 1 CAPSULE(120 MG) BY MOUTH DAILY (Patient taking differently: Take 120 mg by mouth every evening. )   . ELIQUIS 5 MG TABS tablet TAKE 1 TABLET BY MOUTH TWICE DAILY   . Lancets (ONETOUCH DELICA PLUS [id number] USE TO CHECK BLOOD SUGAR TWICE DAILY..   . losartan (COZAAR) 50 MG tablet TAKE 1 TABLET(50 MG) BY MOUTH DAILY (Patient taking differently: Take 50 mg by mouth daily. )   . metFORMIN (GLUCOPHAGE) 500 MG tablet Take 500 mg by mouth 2 (two) times daily with a meal.    . Multiple Vitamin (MULITIVITAMIN WITH MINERALS) TABS Take 1 tablet by mouth daily.   . nitroGLYCERIN (NITROSTAT) 0.4 MG SL tablet Place 0.4 mg under the tongue every 5 (five) minutes as needed for chest pain.   . Marland KitchenOVOLIN N 100  UNIT/ML injection Inject 40 Units into the skin daily before breakfast.    . NOVOLIN R 100 UNIT/ML injection 10 UNITS THIRTY MINUTES BEFORE MEALS.  5 ADDITIONAL UNITS WITH CARBS OR SNACKS.   [name] VERIO test strip USE TO MONITOR GLUCOSE LEVELS TWICE DAILY   . pantoprazole (PROTONIX) 40 MG tablet Take 1 tablet (40 mg total) by mouth daily.   . sucralfate (CARAFATE) 1 GM/10ML  suspension Take 10 mLs (1 g total) by mouth 4 (four) times daily.   Marland Kitchen SYNTHROID 125 MCG tablet Take 125 mcg by mouth every morning.   . escitalopram (LEXAPRO) 20 MG tablet TAKE 1 TABLET(20 MG) BY MOUTH DAILY (Patient not taking: Reported on 10/19/2019)   . ferrous sulfate 325 (65 FE) MG tablet Take 1 tablet (325 mg total) by mouth daily with breakfast. (Patient not taking: Reported on 09/23/2019) 09/23/2019: Due to constipation  . ondansetron (ZOFRAN ODT) 4 MG disintegrating tablet Take 1 tablet (4 mg total) by mouth every 8 (eight) hours as needed for nausea or vomiting. (Patient not taking: Reported on 10/19/2019)   . Semaglutide (RYBELSUS) 3 MG TABS Take 1 tablet by mouth daily. (Patient not taking: Reported on 10/19/2019) 09/23/2019: Unable to tolerate caused abdominal pain   No facility-administered encounter medications on file as of 10/19/2019.     Current Diagnosis/Assessment:  Goals Addressed            This Visit's Progress   . Pharmacy Care Plan       CARE PLAN ENTRY (see longitudinal plan of care for additional care plan information)  Current Barriers:  . Chronic Disease Management support, education, and care coordination needs related to Hypertension, Hyperlipidemia, Diabetes, Atrial Fibrillation, Hypothyroidism, and Barrett's esophagus   Hypertension BP Readings from Last 3 Encounters:  10/05/19 117/63  09/14/19 125/75  09/11/19 132/65   . Pharmacist Clinical Goal(s): o Over the next 90 days, patient will work with PharmD and providers to maintain BP goal <130/80 . Current regimen:   Diltiazem CD 127m, 1 capsule once daily  Losartan 580m1 tablet once daily  . Patient self care activities - Over the next 90 days, patient will: o Check BP as instructed, document, and provide at future appointment  Hyperlipidemia Lab Results  Component Value Date/Time   LDLCALC 85 10/22/2018 04:45 AM   LDLCALC 91 11/21/2016 11:13 AM   LDLDIRECT 98.0 05/24/2014 01:43 PM   .  Pharmacist Clinical Goal(s): o Over the next 90 days, patient will work with PharmD and providers to achieve LDL goal < 70 . Current regimen:  o No medications . Interventions: . How to reduce cholesterol through diet/weight management and physical activity.    . How a diet high in plant sterols (fruits/vegetables/nuts/whole grains/legumes) may reduce your cholesterol.  Encouraged increasing fiber to a daily intake of 10-25g/day  . Patient self care activities - Over the next 90 days, patient will: o Continue working on lifestyle modifications (diet/ exercise).   Diabetes Lab Results  Component Value Date/Time   HGBA1C 7.5 (H) 10/22/2018 04:45 AM   HGBA1C 8.5 (H) 10/05/2018 04:33 AM   . Pharmacist Clinical Goal(s): o Over the next 90 days, patient will work with PharmD and providers to achieve A1c goal <7% . Current regimen:   Metformin 50050m1 tablet BID with a meal   Novolin N, inject 40 units once daily before breakfast  Novolin R, inject 10 units 30 minutes before meals, 5 additional units with carbs or snacks  . Interventions: o We  discussed: how to recognize and treat signs of hypoglycemia . Patient self care activities - Over the next 90 days, patient will: o Check blood sugar twice daily, document, and provide at future appointments o Contact provider with any episodes of hypoglycemia  Atrial fibrillation (paroxysmal)  . Pharmacist Clinical Goal(s) o Over the next 90 days, patient will work with PharmD and providers to prevent strokes.  . Current regimen:  o Diltiazem CD 173m, 1 capsule once daily o apixaban (Eliquis) 572m 1 tablet twice daily . Interventions: o We discussed:  monitoring for signs and symptoms for bleeding (coughing up blood, prolonged nose bleeds, black, tarry stools). . Patient self care activities - Over the next 90 days, patient will: o Continue current medications as instructed.   Hypothyroidism TSH  Date Value Ref Range Status  05/11/2019  14.81 (A) 0.41 - 5.90 Final .  Pharmacist Clinical Goal(s) o Over the next 90 days, patient will work with PharmD and providers to achieve TSH: between 0.35 to 4.5uIU/ml. . Current regimen:  o Levothyroxine 125 mcg, 1 tablet once daily  . Interventions: o We discussed:  administration of levothyroxine (30 minutes prior to first meal.) . Patient self care activities o Patient will follow up with endocrinologist.  o [facility]  Barrett's esophagus . Pharmacist Clinical Goal(s) o Over the next  days, patient will work with PharmD and providers to minimize stomach pain. . Current regimen:  . Pantoprazole 4025m1 tablet once daily . Sucralfate, take 10 MLs four times daily  . Patient self care activities o Patient will continue current medications as directed by gastroenterologist.   Medication management . Pharmacist Clinical Goal(s): o Over the next 90 days, patient will work with PharmD and providers to maintain optimal medication adherence . Current pharmacy: [facility] . Interventions o Comprehensive medication review performed. o Continue current medication management strategy . Patient self care activities - Over the next 90 days, patient will: o Take medications as prescribed o Report any questions or concerns to PharmD and/or provider(s)  Initial goal documentation       SDOH Interventions     Most Recent Value  SDOH Interventions  Financial Strain Interventions Other (Comment)  [Reviewed patient assistance programs. Patient stated she tried in the past, and each time she states they tell her she does not meet criteria due to income.]  Transportation Interventions Intervention Not Indicated, Other (Comment)  [Her husband drives her to appointments]      AFIB (paroxysmal)    Patient is currently rate controlled.  Patient has failed these meds in past: none  Patient is currently controlled on the following  medications:   Diltiazem CD 120m2m capsule once daily  Anticoagluation  apixaban (Eliquis) 5mg,73mtablet twice daily   This patients CHA2DS2-VASc Score and unadjusted Ischemic Stroke Rate (% per year) is equal to 10.8 % stroke rate/year from a score of 8  Above score calculated as 1 point each if present [CHF, HTN, DM, Vascular=MI/PAD/Aortic Plaque, Age if 65-74, or Female] Above score calculated as 2 points each if present [Age > 75, or Stroke/TIA/TE]  CHA2DS2/VAS Stroke Risk Points  Current as of about an hour ago     9 >= 2 Points: High Risk  1 - 1.99 Points: Medium Risk  0 Points: Low Risk   We discussed:  monitoring for signs and symptoms for bleeding (coughing up blood, prolonged nose bleeds, black, tarry stools).  Plan Continue  current medications.   Diabetes   Recent Relevant Labs: Lab Results  Component Value Date/Time   HGBA1C 7.5 (H) 10/22/2018 04:45 AM   HGBA1C 8.5 (H) 10/05/2018 04:33 AM   MICROALBUR 0.8 10/07/2015 01:30 PM   MICROALBUR 1.1 02/08/2014 02:49 PM     Checking BG: 2x per Day  Recent FBG Readings:131  Recent HS BG readings: 85   Patient has failed these meds in past: dulaglutide, glimepiride, liraglutide, pioglitazone, repaglinide, sitagliptin, semaglutide (stomach pain)   Patient is currently uncontrolled on the following medications:   Metformin 511m, 1 tablet BID with a meal   Novolin N, inject 40 units once daily before breakfast  Novolin R, inject 10 units 30 minutes before meals, 5 additional units with carbs or snacks   Last diabetic Eye exam:  Lab Results  Component Value Date/Time   HMDIABEYEEXA No Retinopathy 02/15/2016 03:21 PM   Last diabetic Foot exam: No results found for: HMDIABFOOTEX   We discussed: how to recognize and treat signs of hypoglycemia  Plan Managed by endo (Dr. AGarnet Koyanagi  Continue current medications  Will readdress at follow up visit in 2 days.    Hypertension  Reports dizziness/ lightheadedness.    Office blood pressures are  BP Readings from Last 3 Encounters:  10/05/19 117/63  09/14/19 125/75  09/11/19 132/65   Patient has failed these meds in the past: amlodipine, atenolol, bisoprolol, HCTZ, metoprolol, valsartan   Patient checks BP at home patient reports not checking at home due to numerous doctor visits    Patient home BP readings are ranging: NA  Patient is controlled on:   Diltiazem CD 1262m 1 capsule once daily  Losartan 5068m tablet once daily   Plan Continue current medications   Hyperlipidemia/ CAD    LDL goal < 70   Lipid Panel     Component Value Date/Time   CHOL 128 10/22/2018 0445   CHOL 156 11/21/2016 1113   TRIG 95 10/22/2018 0445   HDL 24 (L) 10/22/2018 0445   HDL 44 11/21/2016 1113   LDLCALC 85 10/22/2018 0445   LDLCALC 91 11/21/2016 1113   LDLDIRECT 98.0 05/24/2014 1343    Hepatic Function Latest Ref Rng & Units 09/29/2019 09/11/2019 08/14/2019  Total Protein 6.0 - 8.3 g/dL 7.0 7.5 7.6  Albumin 3.5 - 5.2 g/dL 4.2 4.4 4.1  AST 0 - 37 U/L 36 48(H) 48(H)  ALT 0 - 35 U/L 41(H) 55(H) 60(H)  Alk Phosphatase 39 - 117 U/L 75 72 86  Total Bilirubin 0.2 - 1.2 mg/dL 1.1 1.1 1.1  Bilirubin, Direct 0.0 - 0.3 mg/dL 0.3 - -     The ASCVD Risk score (GoMikey Bussing Jr., et al., 2013) failed to calculate for the following reasons:   The patient has a prior MI or stroke diagnosis   Patient has failed these meds in past: statins (myalgia), Zetia   Patient is currently uncontrolled on the following medications:  . No medications   Plan Readdress at follow up.   Hypothyroidism   Lab Results  Component Value Date/Time   TSH 14.81 (A) 05/11/2019 12:00 AM   TSH 5.935 (H) 04/30/2019 11:49 AM   TSH 13.59 (A) 12/01/2018 12:00 AM   TSH 9.460 (H) 11/12/2018 09:39 AM   TSH 0.322 (L) 10/21/2018 03:30 PM   TSH 0.79 08/05/2017 11:52 AM   FREET4 0.89 04/30/2019 11:49 AM   FREET4 1.78 (H) 10/21/2018 03:30 PM    Patient is currently uncontrolled on the  following medications:  .  Levothyroxine 125 mcg, 1 tablet once daily   Patient reports she has had more recent blood work with Dr. [name] and reports TSH has not needed to be addressed.   We discussed:  administration of levothyroxine (30 minutes prior to first meal.   Plan Managed by endo (Dr. [name])  Continue current medications  Request blood work from Dr. Festus Holts office   Barrett's esophagus, GERD   Patient reports she will have GI pain if she does not take sucralfate.  Patient has failed these meds in past: dexlansoprazole   Patient is currently controlled on the following medications:  . Pantoprazole 12m, 1 tablet once daily . Sucralfate, take 10 MLs four times daily   Plan Continue current medications  Managed by GI (Dr. AHavery Moros   Headache/ Sleep   Patient reports sleeping much better   Patient has failed these meds in past: , bupropion, citalopram, nortriptyline, Escitalopram (Lexapro)- unsure if still taking   Patient is currently controlled on the following medications:   Amitriptyline 210m 1 tablet at bedtime   We discussed:  Indications of medications.    Plan Patient to bring medications at follow up visit and review them to make sure she is on correct medications.   Anemia    Hemoglobin & Hematocrit     Component Value Date/Time   HGB 10.6 (L) 09/29/2019 1345   HGB 11.0 (L) 11/12/2018 0939   HCT 32.9 (L) 09/29/2019 1345   HCT 31.6 (L) 04/30/2019 1149   Iron/TIBC/Ferritin/ %Sat    Component Value Date/Time   IRON 134 09/29/2019 1345   TIBC 305 08/14/2019 1024   FERRITIN 251.9 09/29/2019 1345   IRONPCTSAT 42.5 09/29/2019 1345    Patient has failed these meds in past: ferrous sulfate (constipation)  Patient is currently uncontrolled on the following medications:  . No medications   Patient reports received 2 IV iron infusions.   Plan Managed by oncology (KIrene Limbo  OTC/ supplement  Patient is currently on the following  medications:  . Marland Kitchenultivitamin, 1 tablet once daily   Plan Continue current medications  Vaccines   Reviewed and discussed patient's vaccination history.    Immunization History  Administered Date(s) Administered  . Fluad Quad(high Dose 65+) 01/20/2019  . Hep A / Hep B 06/15/2015, 07/15/2015, 12/27/2015  . Influenza Split 01/16/2012  . Influenza Whole 01/31/2010  . Influenza, High Dose Seasonal PF 01/28/2014, 12/29/2014, 02/17/2016, 01/21/2018  . Influenza-Unspecified 12/29/2012  . PFIZER SARS-COV-2 Vaccination 05/16/2019, 06/06/2019  . Pneumococcal Conjugate-13 06/14/2017  . Pneumococcal Polysaccharide-23 01/28/2012, 01/28/2014  . Pneumococcal-Unspecified 04/24/2015  . Tdap 12/29/2014  . Zoster 02/14/2012  . Zoster Recombinat (Shingrix) 03/13/2017, 05/24/2017   Plan Patient up-to-date.   Medication Management  Patient organizes medications: to be addressed at follow up Barriers:  to be addressed at follow up Primary pharmacy: [facility]  Adherence:  - losartan 5068mlast refilled 07/09/19 for 90DS)  -  to be addressed at follow up  Follow up Follow up visit with PharmD in 2 days. Patient to bring in medications to review and better assess.  AnnAnson CroftsharmD Clinical Pharmacist [phone]

## 2019-10-19 NOTE — Patient Instructions (Addendum)
Visit Information  Goals Addressed            This Visit's Progress   . Pharmacy Care Plan       CARE PLAN ENTRY (see longitudinal plan of care for additional care plan information)  Current Barriers:  . Chronic Disease Management support, education, and care coordination needs related to Hypertension, Hyperlipidemia, Diabetes, Atrial Fibrillation, Hypothyroidism, and Barrett's esophagus   Hypertension BP Readings from Last 3 Encounters:  10/05/19 117/63  09/14/19 125/75  09/11/19 132/65   . Pharmacist Clinical Goal(s): o Over the next 90 days, patient will work with PharmD and providers to maintain BP goal <130/80 . Current regimen:   Diltiazem CD 140m, 1 capsule once daily  Losartan 577m1 tablet once daily  . Patient self care activities - Over the next 90 days, patient will: o Check BP as instructed, document, and provide at future appointment  Hyperlipidemia Lab Results  Component Value Date/Time   LDLCALC 85 10/22/2018 04:45 AM   LDLCALC 91 11/21/2016 11:13 AM   LDLDIRECT 98.0 05/24/2014 01:43 PM   . Pharmacist Clinical Goal(s): o Over the next 90 days, patient will work with PharmD and providers to achieve LDL goal < 70 . Current regimen:  o No medications . Interventions: . How to reduce cholesterol through diet/weight management and physical activity.    . How a diet high in plant sterols (fruits/vegetables/nuts/whole grains/legumes) may reduce your cholesterol.  Encouraged increasing fiber to a daily intake of 10-25g/day  . Patient self care activities - Over the next 90 days, patient will: o Continue working on lifestyle modifications (diet/ exercise).   Diabetes Lab Results  Component Value Date/Time   HGBA1C 7.5 (H) 10/22/2018 04:45 AM   HGBA1C 8.5 (H) 10/05/2018 04:33 AM   . Pharmacist Clinical Goal(s): o Over the next 90 days, patient will work with PharmD and providers to achieve A1c goal <7% . Current regimen:   Metformin 50063m1 tablet BID  with a meal   Novolin N, inject 40 units once daily before breakfast  Novolin R, inject 10 units 30 minutes before meals, 5 additional units with carbs or snacks  . Interventions: o We discussed: how to recognize and treat signs of hypoglycemia . Patient self care activities - Over the next 90 days, patient will: o Check blood sugar twice daily, document, and provide at future appointments o Contact provider with any episodes of hypoglycemia  Atrial fibrillation (paroxysmal)  . Pharmacist Clinical Goal(s) o Over the next 90 days, patient will work with PharmD and providers to prevent strokes.  . Current regimen:  o Diltiazem CD 120m30m capsule once daily o apixaban (Eliquis) 5mg,9mtablet twice daily . Interventions: o We discussed:  monitoring for signs and symptoms for bleeding (coughing up blood, prolonged nose bleeds, black, tarry stools). . Patient self care activities - Over the next 90 days, patient will: o Continue current medications as instructed.   Hypothyroidism TSH  Date Value Ref Range Status  05/11/2019 14.81 (A) 0.41 - 5.90 Final .  Pharmacist Clinical Goal(s) o Over the next 90 days, patient will work with PharmD and providers to achieve TSH: between 0.35 to 4.5uIU/ml. . Current regimen:  o Levothyroxine 125 mcg, 1 tablet once daily  . Interventions: o We discussed:  administration of levothyroxine (30 minutes prior to first meal.) . Patient self care activities o Patient will follow up with endocrinologist.  o [facility]  Barrett's esophagus . Pharmacist Clinical Goal(s) o Over the next  days, patient will work with PharmD and providers to minimize stomach pain. . Current regimen:  . Pantoprazole 58m, 1 tablet once daily . Sucralfate, take 10 MLs four times daily  . Patient self care activities o Patient will continue current medications as directed by gastroenterologist.   Medication  management . Pharmacist Clinical Goal(s): o Over the next 90 days, patient will work with PharmD and providers to maintain optimal medication adherence . Current pharmacy: [facility] . Interventions o Comprehensive medication review performed. o Continue current medication management strategy . Patient self care activities - Over the next 90 days, patient will: o Take medications as prescribed o Report any questions or concerns to PharmD and/or provider(s)  Initial goal documentation        Breanna Webster was given information about Chronic Care Management services today including:  1. CCM service includes personalized support from designated clinical staff supervised by her physician, including individualized plan of care and coordination with other care providers 2. 24/7 contact phone numbers for assistance for urgent and routine care needs. 3. Standard insurance, coinsurance, copays and deductibles apply for chronic care management only during months in which we provide at least 20 minutes of these services. Most insurances cover these services at 100%, however patients may be responsible for any copay, coinsurance and/or deductible if applicable. This service may help you avoid the need for more expensive face-to-face services. 4. Only one practitioner may furnish and bill the service in a calendar month. 5. The patient may stop CCM services at any time (effective at the end of the month) by phone call to the office staff.  Patient agreed to services and verbal consent obtained.   The patient verbalized understanding of instructions provided today and agreed to receive a mailed copy of patient instruction and/or educational materials.  Face to Face appointment with pharmacist scheduled for:  10/21/2019  AAnson Crofts PharmD Clinical Pharmacist [facility]([phone]   Hypothyroidism  Hypothyroidism is when the thyroid gland does not make enough  of certain hormones (it is underactive). The thyroid gland is a small gland located in the lower front part of the neck, just in front of the windpipe (trachea). This gland makes hormones that help control how the body uses food for energy (metabolism) as well as how the heart and brain function. These hormones also play a role in keeping your bones strong. When the thyroid is underactive, it produces too little of the hormones thyroxine (T4) and triiodothyronine (T3). What are the causes? This condition may be caused by:  Hashimoto's disease. This is a disease in which the body's disease-fighting system (immune system) attacks the thyroid gland. This is the most common cause.  Viral infections.  Pregnancy.  Certain medicines.  Birth defects.  Past radiation treatments to the head or neck for cancer.  Past treatment with radioactive iodine.  Past exposure to radiation in the environment.  Past surgical removal of part or all of the thyroid.  Problems with a gland in the center of the brain (pituitary gland).  Lack of enough iodine in the diet. What increases the risk? You are more likely to develop this condition if:  You are female.  You have a family history of thyroid conditions.  You use a medicine called lithium.  You take medicines that affect the immune system (immunosuppressants). What are the signs or symptoms? Symptoms of this condition include:  Feeling as though you have no energy (lethargy).  Not being able to tolerate cold.  Weight gain that is not explained by a change in diet or exercise habits.  Lack of appetite.  Dry skin.  Coarse hair.  Menstrual irregularity.  Slowing of thought processes.  Constipation.  Sadness or depression. How is this diagnosed? This condition may be diagnosed based on:  Your symptoms, your medical history, and a physical exam.  Blood tests. You may also have imaging tests, such as an ultrasound or MRI. How is  this treated? This condition is treated with medicine that replaces the thyroid hormones that your body does not make. After you begin treatment, it may take several weeks for symptoms to go away. Follow these instructions at home:  Take over-the-counter and prescription medicines only as told by your health care provider.  If you start taking any new medicines, tell your health care provider.  Keep all follow-up visits as told by your health care provider. This is important. ? As your condition improves, your dosage of thyroid hormone medicine may change. ? You will need to have blood tests regularly so that your health care provider can monitor your condition. Contact a health care provider if:  Your symptoms do not get better with treatment.  You are taking thyroid replacement medicine and you: ? Sweat a lot. ? Have tremors. ? Feel anxious. ? Lose weight rapidly. ? Cannot tolerate heat. ? Have emotional swings. ? Have diarrhea. ? Feel weak. Get help right away if you have:  Chest pain.  An irregular heartbeat.  A rapid heartbeat.  Difficulty breathing. Summary  Hypothyroidism is when the thyroid gland does not make enough of certain hormones (it is underactive).  When the thyroid is underactive, it produces too little of the hormones thyroxine (T4) and triiodothyronine (T3).  The most common cause is Hashimoto's disease, a disease in which the body's disease-fighting system (immune system) attacks the thyroid gland. The condition can also be caused by viral infections, medicine, pregnancy, or past radiation treatment to the head or neck.  Symptoms may include weight gain, dry skin, constipation, feeling as though you do not have energy, and not being able to tolerate cold.  This condition is treated with medicine to replace the thyroid hormones that your body does not make. This information is not intended to replace advice given to you by your health care provider.  Make sure you discuss any questions you have with your health care provider. Document Revised: 03/22/2017 Document Reviewed: 03/20/2017 Elsevier Patient Education  2020 [employer].

## 2019-10-21 ENCOUNTER — Ambulatory Visit: Payer: Medicare Other

## 2019-10-22 ENCOUNTER — Encounter: Payer: Self-pay | Admitting: Gastroenterology

## 2019-10-22 ENCOUNTER — Ambulatory Visit (INDEPENDENT_AMBULATORY_CARE_PROVIDER_SITE_OTHER): Payer: Medicare Other | Admitting: Gastroenterology

## 2019-10-22 VITALS — BP 121/72 | HR 66 | Ht 65.0 in | Wt 178.6 lb

## 2019-10-22 DIAGNOSIS — R1031 Right lower quadrant pain: Secondary | ICD-10-CM

## 2019-10-22 DIAGNOSIS — R1084 Generalized abdominal pain: Secondary | ICD-10-CM | POA: Diagnosis not present

## 2019-10-22 DIAGNOSIS — I2510 Atherosclerotic heart disease of native coronary artery without angina pectoris: Secondary | ICD-10-CM

## 2019-10-22 NOTE — Progress Notes (Signed)
10/22/2019 RANEEN JAFFER [id number] Jul 27, 1945   HISTORY OF PRESENT ILLNESS:  This is a 74 year old female who is a patient of Dr. Doyne Keel and follows here for her cirrhosis related to fatty liver, reflux and short segment Barrett's esophagus, and history of colon polyps.  She was just seen by him on August 18, 2019 so please see that note for further history surrounding her chronic GI issues.  She presents here today with complaints of fixed right mid abdominal pain for the past few months.  She says that it was present before she saw Dr. Havery Moros, but it does not look like it was discussed or mentioned at that visit.  She describes it as a constant aching/nagging pain that is not affected by movement, eating, bowel habits.  She says that it stays in the same spot all the time, very localized.  She says that she is moving her bowels well without assistance (not using Miralax) and denies any other associated complaints.  She says that it does not keep her from sleeping at night.  Recent CT scan did not show any definite cause of her pain.  It did show some subcutaneous fat stranding likely related to her insulin injection sites.  CT scan also showed mild diffuse distal esophageal wall thickening compatible with esophagitis.  She takes her pantoprazole 40 mg every day.  Carafate suspension was added 4 times daily and she has been using that, but has not gotten any relief from that.  Recent labs also unremarkable for any identifying cause of her pain.   Endoscopic history: EGD 02/03/19 - irregular zline, biopsies c/w nondysplastic BE, loose nissen, no varices, benign duodenal polyp Colonoscopy 02/03/19 - 8 small adenomas, diverticulosis, lipoma EGD 03/20/2017 - irregular z line, LA grade B esophagitis, small paraesophageal hernia, loose Nissen wrap, mild gastritis, duodenal erosions - path c/w Barrett's, H pylori negative  Colonoscopy 03/20/2017 - pancolonic diverticulosis, 28 polyps removed  - EGD 2002 - suspected segment of BE, no BE on biopsies EGD 2006 - esophagitis and gastritis, no evidence of BE Colonoscopy 5/05 - diverticulosis Colonoscopy 4/15 - 4 small adenomas  Prior imaging: CT scan of the abdomen and pelvis with contrast 09/11/2019 that showed no acute abnormalities, mild diffuse distal esophageal wall thickening compatible with esophagitis, colonic diverticulosis, mild diffuse hepatic steatosis. [name] 06/17/19 - cirrhotic liver without focal mass US11/23/20 - suggestive of cirrhosis, post chole CT scan 06/17/18 - thickening of the esophagus unchanged from 2013, "finely irregular liver surface, cannot exclude cirrhosis", no mass lesions US abdomen - 06/13/2015 - fatty liver   Past Medical History:  Diagnosis Date  . Anxiety   . Arthritis    "back" (04/22/2018)  . Back pain   . Blood transfusion without reported diagnosis   . CAD (coronary artery disease)    a. 03/2018 s/p PCI/DES to the RCA (3.0x15 Onyx DES).  . Carotid artery stenosis    Mild  . Chest pain   . Chronic lower back pain   . Cirrhosis ([place])   . Colon polyps   . Diverticulitis   . Diverticulosis   . Esophageal thickening    seen on pre TAVR CT scan, also questionable cirrhosis. MRI recommended. Will refer to GI  . Fatty liver   . GERD (gastroesophageal reflux disease)   . Grave's disease   . History of colonic polyps 05/22/2017  . History of hiatal hernia   . Hypertension   . Hypothyroidism   . IBS (irritable bowel syndrome)   .  Osteopenia   . Pulmonary nodules    seen on pre TAVR CT. likley benign. no follow up recommended if pt low risk.  . S/P TAVR (transcatheter aortic valve replacement)   . Severe aortic stenosis   . Shortness of breath on exertion   . Stroke ([place])   . Thalassemia minor   . Thyroid disease   . Type II diabetes mellitus ([place])    Past Surgical History:  Procedure Laterality Date  . [place] STUDY N/A 03/03/2018   Procedure: [place] STUDY;  Surgeon:  Mauri Pole, MD;  Location: [facility];  Service: Endoscopy;  Laterality: N/A;  . COLONOSCOPY    . COLONOSCOPY W/ BIOPSIES AND POLYPECTOMY    . CORONARY ANGIOGRAPHY Right 04/21/2018   Procedure: CORONARY ANGIOGRAPHY (CATH LAB);  Surgeon: [name], MD;  Location: [place] CV LAB;  Service: Cardiovascular;  Laterality: Right;  . CORONARY STENT INTERVENTION N/A 04/22/2018   Procedure: CORONARY STENT INTERVENTION;  Surgeon: [name], MD;  Location: [place] CV LAB;  Service: Cardiovascular;  Laterality: N/A;  . DILATION AND CURETTAGE OF UTERUS    . ESOPHAGEAL MANOMETRY N/A 03/03/2018   Procedure: ESOPHAGEAL MANOMETRY (EM);  Surgeon: Mauri Pole, MD;  Location: [facility];  Service: Endoscopy;  Laterality: N/A;  . GASTRIC FUNDOPLICATION    . HERNIA REPAIR    . HYSTEROSCOPY     fibroids  . LAPAROSCOPIC CHOLECYSTECTOMY    . LAPAROSCOPY     fibroids  . NISSEN FUNDOPLICATION  5537S  . POLYPECTOMY    . RIGHT/LEFT HEART CATH AND CORONARY ANGIOGRAPHY N/A 02/20/2018   Procedure: RIGHT/LEFT HEART CATH AND CORONARY ANGIOGRAPHY;  Surgeon: [name], MD;  Location: [place] CV LAB;  Service: Cardiovascular;  Laterality: N/A;  . TEE WITHOUT CARDIOVERSION N/A 07/08/2018   Procedure: TRANSESOPHAGEAL ECHOCARDIOGRAM (TEE);  Surgeon: Burnell Blanks, MD;  Location: [place];  Service: Open Heart Surgery;  Laterality: N/A;  . TEE WITHOUT CARDIOVERSION  10/07/2018  . TEE WITHOUT CARDIOVERSION N/A 10/07/2018   Procedure: TRANSESOPHAGEAL ECHOCARDIOGRAM (TEE);  Surgeon: Jerline Pain, MD;  Location[facility] ENDOSCOPY;  Service: Cardiovascular;  Laterality: N/A;  . TONSILLECTOMY    . TRANSCATHETER AORTIC VALVE REPLACEMENT, TRANSFEMORAL N/A 07/08/2018   Procedure: TRANSCATHETER AORTIC VALVE REPLACEMENT, TRANSFEMORAL;  Surgeon: Burnell Blanks, MD;  Location: [place];  Service: Open Heart Surgery;  Laterality: N/A;    reports that she has never smoked. She has never  used smokeless tobacco. She reports that she does not drink alcohol and does not use drugs. family history includes Headache in an other family member; Healthy in her son; Heart failure in her mother. She was adopted. Allergies  Allergen Reactions  . Statins Other (See Comments)    Muscle aches      Outpatient Encounter Medications as of 10/22/2019  Medication Sig  . amitriptyline (ELAVIL) 25 MG tablet Take 25 mg by mouth at bedtime.  . BD INSULIN SYRINGE U/F 31G X 5/16" 1 ML MISC USE TO INJECT INSULIN EVERY DAY  . Cholecalciferol (VITAMIN D3 PO) Take 1 tablet by mouth daily.  Marland Kitchen diltiazem (CARDIZEM CD) 120 MG 24 hr capsule TAKE 1 CAPSULE(120 MG) BY MOUTH DAILY (Patient taking differently: Take 120 mg by mouth every evening. )  . ELIQUIS 5 MG TABS tablet TAKE 1 TABLET BY MOUTH TWICE DAILY  . escitalopram (LEXAPRO) 20 MG tablet TAKE 1 TABLET(20 MG) BY MOUTH DAILY  . Lancets (ONETOUCH DELICA PLUS [id number]) [place]  USE TO CHECK BLOOD SUGAR TWICE DAILY..  . losartan (COZAAR) 50 MG tablet TAKE 1 TABLET(50 MG) BY MOUTH DAILY  . metFORMIN (GLUCOPHAGE) 500 MG tablet Take 500 mg by mouth 2 (two) times daily with a meal.   . Multiple Vitamin (MULITIVITAMIN WITH MINERALS) TABS Take 1 tablet by mouth daily.  . nitroGLYCERIN (NITROSTAT) 0.4 MG SL tablet Place 0.4 mg under the tongue every 5 (five) minutes as needed for chest pain.  Marland Kitchen NOVOLIN N 100 UNIT/ML injection Inject 40 Units into the skin daily before breakfast.   . NOVOLIN R 100 UNIT/ML injection 10 UNITS THIRTY MINUTES BEFORE MEALS.  5 ADDITIONAL UNITS WITH CARBS OR SNACKS.  [name] VERIO test strip USE TO MONITOR GLUCOSE LEVELS TWICE DAILY  . pantoprazole (PROTONIX) 40 MG tablet Take 1 tablet (40 mg total) by mouth daily.  . sucralfate (CARAFATE) 1 GM/10ML suspension Take 10 mLs (1 g total) by mouth 4 (four) times daily.  Marland Kitchen SYNTHROID 125 MCG tablet Take 125 mcg by mouth every morning.  . [DISCONTINUED] ferrous sulfate 325 (65 FE) MG tablet Take  1 tablet (325 mg total) by mouth daily with breakfast. (Patient not taking: Reported on 09/23/2019)  . [DISCONTINUED] ondansetron (ZOFRAN ODT) 4 MG disintegrating tablet Take 1 tablet (4 mg total) by mouth every 8 (eight) hours as needed for nausea or vomiting. (Patient not taking: Reported on 10/19/2019)  . [DISCONTINUED] Semaglutide (RYBELSUS) 3 MG TABS Take 1 tablet by mouth daily. (Patient not taking: Reported on 10/19/2019)   No facility-administered encounter medications on file as of 10/22/2019.     REVIEW OF SYSTEMS  : All other systems reviewed and negative except where noted in the History of Present Illness.   PHYSICAL EXAM: BP 121/72   Pulse 66   Ht 5' 5"  (1.651 m)   Wt 178 lb 9.6 oz (81 kg)   SpO2 95%   BMI 29.72 kg/m  General: Well developed white female in no acute distress Head: Normocephalic and atraumatic Eyes:  Sclerae anicteric, conjunctiva pink. Ears: Normal auditory acuity Lungs: Clear throughout to auscultation; no increased WOB. Heart: Regular rate and rhythm; no M/R/G. Abdomen: Soft, non-distended.  BS present.  Mild diffuse TTP but more so in the right mid-abdomen area. Musculoskeletal: Symmetrical with no gross deformities  Skin: No lesions on visible extremities Extremities: No edema  Neurological: Alert oriented x 4, grossly non-focal Psychological:  Alert and cooperative. Normal mood and affect  ASSESSMENT AND PLAN: *74 year old female with complaints of fixed right mid abdominal pain for the past few months.  Described as a constant aching/nagging pain that is not affected by movement, eating, bowel habits.  She says that she is moving her bowels well without assistance and denies any other associated complaints.  Recent CT scan did not show any definite cause of her pain.  It did show some subcutaneous fat stranding likely related to her insulin injection sites so I wonder if this could be the issue.  We also discussed possibly due to adhesions/scar tissue  from previous surgeries.  On exam she was mildly tender diffusely this is a little more so in the right mid abdominal area.  I am not sure what else would be causing her pain.  I spent an extensive amount of time today trying to reassure her that I do not think is anything bad.  She has not had any improvement with the Carafate suspension and I do not think this is related to her reflux/esophagitis so I told  her she could discontinue that.  I told her that I would reach out to Dr. Havery Moros to see what his other thoughts are.  She asked if it could be related to her medications, although my suspicions for that is low.  She also asked if it could be gas related, but I said that that should come and go and move around in location.   CC:  [name], NP

## 2019-10-22 NOTE — Patient Instructions (Signed)
If you are age 75 or older, your body mass index should be between 23-30. Your Body mass index is 29.72 kg/m. If this is out of the aforementioned range listed, please consider follow up with your Primary Care Provider.  If you are age 59 or younger, your body mass index should be between 19-25. Your Body mass index is 29.72 kg/m. If this is out of the aformentioned range listed, please consider follow up with your Primary Care Provider.   Alonza Bogus PA-C will talk to Dr. Havery Moros and discuss what kind of further work up you need. You will be contacted by the office at that point.

## 2019-10-28 ENCOUNTER — Other Ambulatory Visit: Payer: Self-pay

## 2019-10-28 ENCOUNTER — Encounter (INDEPENDENT_AMBULATORY_CARE_PROVIDER_SITE_OTHER): Payer: Self-pay | Admitting: Family Medicine

## 2019-10-28 ENCOUNTER — Ambulatory Visit (INDEPENDENT_AMBULATORY_CARE_PROVIDER_SITE_OTHER): Payer: Medicare Other | Admitting: Family Medicine

## 2019-10-28 VITALS — BP 107/65 | HR 68 | Temp 97.8°F | Ht 65.0 in | Wt 179.0 lb

## 2019-10-28 DIAGNOSIS — Z794 Long term (current) use of insulin: Secondary | ICD-10-CM | POA: Diagnosis not present

## 2019-10-28 DIAGNOSIS — D509 Iron deficiency anemia, unspecified: Secondary | ICD-10-CM | POA: Diagnosis not present

## 2019-10-28 DIAGNOSIS — E038 Other specified hypothyroidism: Secondary | ICD-10-CM

## 2019-10-28 DIAGNOSIS — I2510 Atherosclerotic heart disease of native coronary artery without angina pectoris: Secondary | ICD-10-CM | POA: Diagnosis not present

## 2019-10-28 DIAGNOSIS — K760 Fatty (change of) liver, not elsewhere classified: Secondary | ICD-10-CM | POA: Diagnosis not present

## 2019-10-28 DIAGNOSIS — E669 Obesity, unspecified: Secondary | ICD-10-CM | POA: Diagnosis not present

## 2019-10-28 DIAGNOSIS — E1169 Type 2 diabetes mellitus with other specified complication: Secondary | ICD-10-CM | POA: Diagnosis not present

## 2019-10-28 DIAGNOSIS — Z6830 Body mass index (BMI) 30.0-30.9, adult: Secondary | ICD-10-CM

## 2019-10-28 NOTE — Progress Notes (Signed)
Agree with assessment as outlined. Unclear cause of pain but CT and labs reassuring, she also has had a recent colonoscopy. Given description of symptoms this sounds perhaps musculoskeletal or abdominal wall pain. I can see her back in a few weeks for reassessment if this persists and consider a trigger point injection if exam is c/w abdominal wall pain. Otherwise, her workup thus far is reassuring.

## 2019-10-29 ENCOUNTER — Ambulatory Visit: Payer: Medicare Other

## 2019-10-29 DIAGNOSIS — I2510 Atherosclerotic heart disease of native coronary artery without angina pectoris: Secondary | ICD-10-CM

## 2019-10-29 DIAGNOSIS — E785 Hyperlipidemia, unspecified: Secondary | ICD-10-CM

## 2019-10-29 DIAGNOSIS — E039 Hypothyroidism, unspecified: Secondary | ICD-10-CM

## 2019-10-29 DIAGNOSIS — Z8673 Personal history of transient ischemic attack (TIA), and cerebral infarction without residual deficits: Secondary | ICD-10-CM

## 2019-10-29 DIAGNOSIS — Z794 Long term (current) use of insulin: Secondary | ICD-10-CM

## 2019-10-29 DIAGNOSIS — I480 Paroxysmal atrial fibrillation: Secondary | ICD-10-CM

## 2019-10-29 DIAGNOSIS — E1169 Type 2 diabetes mellitus with other specified complication: Secondary | ICD-10-CM

## 2019-10-29 DIAGNOSIS — I10 Essential (primary) hypertension: Secondary | ICD-10-CM

## 2019-10-29 DIAGNOSIS — K2270 Barrett's esophagus without dysplasia: Secondary | ICD-10-CM

## 2019-10-29 DIAGNOSIS — F419 Anxiety disorder, unspecified: Secondary | ICD-10-CM

## 2019-10-29 NOTE — Progress Notes (Signed)
Chief Complaint:   OBESITY Breanna Webster is here to discuss her progress with her obesity treatment plan along with follow-up of her obesity related diagnoses. Breanna Webster is on the [place] and states she is following her eating plan approximately 70-75% of the time. Breanna Webster states she is walking 1/2-1 miles 3 times per week.  Today's visit was #: 4 Starting weight: 184 lbs Starting date: 08/25/2019 Today's weight: 179 lbs Today's date: 10/28/2019 Total lbs lost to date: 5 lbs Total lbs lost since last in-office visit: 0  Interim History: Breanna Webster says she has added peaches, rice, and a no burger burger to her diet.  Subjective:   1. Other specified hypothyroidism Breanna Webster is taking Synthroid 125 mcg daily.  Lab Results  Component Value Date   TSH 14.81 (A) 05/11/2019   2. Microcytic anemia  CBC Latest Ref Rng & Units 09/29/2019 09/11/2019 08/14/2019  WBC 4.0 - 10.5 K/uL 6.7 7.3 7.8  Hemoglobin 12.0 - 15.0 g/dL 10.6(L) 10.7(L) 11.3(L)  Hematocrit 36 - 46 % 32.9(L) 35.0(L) 37.0  Platelets 150 - 400 K/uL 172.0 187 193   Lab Results  Component Value Date   IRON 134 09/29/2019   TIBC 305 08/14/2019   FERRITIN 251.9 09/29/2019   Lab Results  Component Value Date   VITAMINB12 575 04/30/2019   3. Hepatic steatosis Breanna Webster has the diagnosis of hepatic steatosis. She denies abdominal pain or jaundice and has never been told of any liver problems in the past. She denies excessive alcohol intake.  Lab Results  Component Value Date   ALT 41 (H) 09/29/2019   AST 36 09/29/2019   ALKPHOS 75 09/29/2019   BILITOT 1.1 09/29/2019   4. Type 2 diabetes mellitus with other specified complication, with long-term current use of insulin (HCC) Medications reviewed. Diabetic ROS: no polyuria or polydipsia, no chest pain, dyspnea or TIA's, no numbness, tingling or pain in extremities.   Lab Results  Component Value Date   HGBA1C 7.5 (H) 10/22/2018   HGBA1C 8.5 (H) 10/05/2018   HGBA1C 9.3 (H) 07/07/2018    Lab Results  Component Value Date   MICROALBUR 0.8 10/07/2015   LDLCALC 85 10/22/2018   CREATININE 0.72 09/11/2019   Lab Results  Component Value Date   INSULIN 76.3 (H) 11/21/2016   Assessment/Plan:   1. Other specified hypothyroidism Patient with long-standing hypothyroidism, on levothyroxine therapy. She appears euthyroid. Orders and follow up as documented in patient record.  She will have labs next week with Endo.  Counseling . Good thyroid control is important for overall health. Supratherapeutic thyroid levels are dangerous and will not improve weight loss results. . The correct way to take levothyroxine is fasting, with water, separated by at least 30 minutes from breakfast, and separated by more than 4 hours from calcium, iron, multivitamins, acid reflux medications (PPIs).   3. Hepatic steatosis Breanna Webster was educated the importance of weight loss. Breanna Webster agreed to continue with her weight loss efforts with healthier diet and exercise as an essential part of her treatment plan.  4. Type 2 diabetes mellitus with other specified complication, with long-term current use of insulin (HCC) Good blood sugar control is important to decrease the likelihood of diabetic complications such as nephropathy, neuropathy, limb loss, blindness, coronary artery disease, and death. Intensive lifestyle modification including diet, exercise and weight loss are the first line of treatment for diabetes.   5. Class 1 obesity with serious comorbidity and body mass index (BMI) of 30.0 to 30.9 in adult,  unspecified obesity type Breanna Webster is currently in the action stage of change. As such, her goal is to continue with weight loss efforts. She has agreed to the [employer]).   Exercise goals: Older adults should follow the adult guidelines. When older adults cannot meet the adult guidelines, they should be as physically active as their abilities and conditions will allow.  Older adults should do  exercises that maintain or improve balance if they are at risk of falling.   Behavioral modification strategies: increasing lean protein intake and decreasing simple carbohydrates.  Breanna Webster has agreed to follow-up with our clinic in 4 weeks. She was informed of the importance of frequent follow-up visits to maximize her success with intensive lifestyle modifications for her multiple health conditions.   Objective:   Blood pressure 107/65, pulse 68, temperature 97.8 F (36.6 C), temperature source Oral, height 5' 5"  (1.651 m), weight 179 lb (81.2 kg), SpO2 98 %. Body mass index is 29.79 kg/m.  General: Cooperative, alert, well developed, in no acute distress. HEENT: Conjunctivae and lids unremarkable. Cardiovascular: Regular rhythm.  Lungs: Normal work of breathing. Neurologic: No focal deficits.   Lab Results  Component Value Date   CREATININE 0.72 09/11/2019   BUN 18 09/11/2019   NA 139 09/11/2019   K 4.2 09/11/2019   CL 103 09/11/2019   CO2 27 09/11/2019   Lab Results  Component Value Date   ALT 41 (H) 09/29/2019   AST 36 09/29/2019   ALKPHOS 75 09/29/2019   BILITOT 1.1 09/29/2019   Lab Results  Component Value Date   HGBA1C 7.5 (H) 10/22/2018   HGBA1C 8.5 (H) 10/05/2018   HGBA1C 9.3 (H) 07/07/2018   HGBA1C 7.4 (A) 04/07/2018   HGBA1C 9.4 (A) 02/04/2018   Lab Results  Component Value Date   INSULIN 76.3 (H) 11/21/2016   Lab Results  Component Value Date   TSH 14.81 (A) 05/11/2019   Lab Results  Component Value Date   CHOL 128 10/22/2018   HDL 24 (L) 10/22/2018   LDLCALC 85 10/22/2018   LDLDIRECT 98.0 05/24/2014   TRIG 95 10/22/2018   CHOLHDL 5.3 10/22/2018   Lab Results  Component Value Date   WBC 6.7 09/29/2019   HGB 10.6 (L) 09/29/2019   HCT 32.9 (L) 09/29/2019   MCV 64.8 Repeated and verified X2. (L) 09/29/2019   PLT 172.0 09/29/2019   Lab Results  Component Value Date   IRON 134 09/29/2019   TIBC 305 08/14/2019   FERRITIN 251.9 09/29/2019    Attestation Statements:   Reviewed by clinician on day of visit: allergies, medications, problem list, medical history, surgical history, family history, social history, and previous encounter notes.  Time spent on visit including pre-visit chart review and post-visit care and charting was 25 minutes.   I, Water quality scientist, CMA, am acting as transcriptionist for Briscoe Deutscher, DO  I have reviewed the above documentation for accuracy and completeness, and I agree with the above. Briscoe Deutscher, DO

## 2019-10-29 NOTE — Chronic Care Management (AMB) (Signed)
Chronic Care Management Pharmacy  Name: Breanna Webster  MRN: [id number] DOB: Aug 18, 1945  Initial Questions: 1. Have you seen any other providers since your last visit? NA 2. Any changes in your medicines or health? Yes    Chief Complaint/ HPI  Breanna Webster,  74 y.o. , female presents for their Initial CCM visit with the clinical pharmacist In office.  Patient stated she is doing well after stroke. She notes she is still dealing with lingering effects. Patient would like to focus on medications that can cause non-alcoholic fatty liver and stop taking those medications.   PCP : [name], NP  Their chronic conditions include: Afib, DM, HTN, HLD, hypothyroidism, Barrett's esophagus, GERD, Anemia, Headache/ sleep  Office Visits: 07/31/2019- [name], NP- Patient presented for virtual visit for concern of UTI. Plan: urine culture. Patient to start ciprofloxacin 565m, 1 tablet BID for 10 days.   Consult Visit: 10/28/2019- Weight management center-  EBriscoe Deutscher DO- Patient presented for office visit to discuss progress on obesity treatment plan. Patient to follow up in 4 weeks.   10/22/2019- Gastroenterology- JAlonza Bogus PA- Patient presented for office visit for cirrhosis related to fatty liver, relux, and Barrett's esophagus. Patient to discontinue sucralfate suspension since she saw no improvement. PA to talk to Dr. AHavery Morosand discuss what kind of further workup is needed.   10/05/2019- Weight management center-  EBriscoe Deutscher DO- Patient presented for office visit to discuss progress on obesity treatment plan. No major interventions done. Patient to continue Pescatarian diet plan. Patient to return in 3 weeks.   09/14/2019- Neurology- JFrann Rider NP- Patient presented for office visit for stroke follow up. Patient presented with symptoms of intermittent double vision, imbalance, dizziness, and left hand shaking. Patient to obtain brain MRI to rule out stroke. No  other interventions/ changes. Patient to follow up in 3 months.   08/18/2019- Gastroenterology- Dr. SCarolina Cellar MD- Patient presented for office visit for follow up visit. For iron deficiency anemia, patient received IV iron x2 in the past, and was recommended to take ferrous sulfate 3268mdaily. For loose stools, patient to speak with prescriber about Rylbesus. Patient referred to pelvic floor PT. Patient to follow up in 6 months.  08/14/2019- Oncology- Dr. GaBrunetta GeneraMD- Patient presented for office visit for thalassemia minor and IDA.  Blood work discussed with patient. Patient was recommended to f/u with PCP, take multi-vitamin and vitamin D3. Follow up in 6 months.   07/01/2019- Cardiology- KaAngelena FormPA- Patient presented for office visit for 1 year s/p TAVR. Patient instructed she can stop aspirin at this time. No other interventions. Patient to follow up in 6 months.   05/26/2019- Pulmonology- MiChristinia GullyMD-   Medications: Outpatient Encounter Medications as of 10/29/2019  Medication Sig  . amitriptyline (ELAVIL) 25 MG tablet Take 25 mg by mouth at bedtime.  . BD INSULIN SYRINGE U/F 31G X 5/16" 1 ML MISC USE TO INJECT INSULIN EVERY DAY  . Cholecalciferol (VITAMIN D3 PO) Take 1 tablet by mouth daily.  . Marland Kitcheniltiazem (CARDIZEM CD) 120 MG 24 hr capsule TAKE 1 CAPSULE(120 MG) BY MOUTH DAILY (Patient taking differently: Take 120 mg by mouth every evening. )  . ELIQUIS 5 MG TABS tablet TAKE 1 TABLET BY MOUTH TWICE DAILY  . escitalopram (LEXAPRO) 20 MG tablet TAKE 1 TABLET(20 MG) BY MOUTH DAILY  . Lancets (ONETOUCH DELICA PLUS [id number] USE TO CHECK BLOOD SUGAR TWICE DAILY..  . losartan (COZAAR) 50 MG tablet  TAKE 1 TABLET(50 MG) BY MOUTH DAILY  . metFORMIN (GLUCOPHAGE) 500 MG tablet Take 500 mg by mouth 2 (two) times daily with a meal.   . Multiple Vitamin (MULITIVITAMIN WITH MINERALS) TABS Take 1 tablet by mouth daily.  Marland Kitchen NOVOLIN N 100 UNIT/ML injection Inject 40  Units into the skin daily before breakfast.   . NOVOLIN R 100 UNIT/ML injection 10 UNITS THIRTY MINUTES BEFORE MEALS.  5 ADDITIONAL UNITS WITH CARBS OR SNACKS.  [name] VERIO test strip USE TO MONITOR GLUCOSE LEVELS TWICE DAILY  . pantoprazole (PROTONIX) 40 MG tablet Take 1 tablet (40 mg total) by mouth daily.  Marland Kitchen SYNTHROID 125 MCG tablet Take 125 mcg by mouth every morning.  . nitroGLYCERIN (NITROSTAT) 0.4 MG SL tablet Place 0.4 mg under the tongue every 5 (five) minutes as needed for chest pain. (Patient not taking: Reported on 10/29/2019)  . sucralfate (CARAFATE) 1 GM/10ML suspension Take 10 mLs (1 g total) by mouth 4 (four) times daily. (Patient not taking: Reported on 10/29/2019)   No facility-administered encounter medications on file as of 10/29/2019.     Current Diagnosis/Assessment:  Goals Addressed            This Visit's Progress   . Pharmacy Care Plan       CARE PLAN ENTRY (see longitudinal plan of care for additional care plan information)  Current Barriers:  . Chronic Disease Management support, education, and care coordination needs related to Hypertension, Hyperlipidemia, Diabetes, Atrial Fibrillation, Coronary Artery Disease, Hypothyroidism, Anxiety, and Barrett's esophagus, history of CVA.   Hypertension BP Readings from Last 3 Encounters:  10/05/19 117/63  09/14/19 125/75  09/11/19 132/65   . Pharmacist Clinical Goal(s): o Over the next 90 days, patient will work with PharmD and providers to maintain BP goal <130/80 . Current regimen:   Diltiazem CD 160m, 1 capsule once daily  Losartan 52m1 tablet once daily  . Patient self care activities - Over the next 90 days, patient will: o Check BP as instructed, document, and provide at future appointment  Hyperlipidemia/ CAD/ History of CVA Lab Results  Component Value Date/Time   LDLCALC 85 10/22/2018 04:45 AM   LDLCALC 91 11/21/2016 11:13 AM   LDLDIRECT 98.0 05/24/2014 01:43 PM   . Pharmacist Clinical  Goal(s): o Over the next 90 days, patient will work with PharmD and providers to achieve LDL goal < 70 . Current regimen:  o No medications . Interventions: . How to reduce cholesterol through diet/weight management and physical activity.    . How a diet high in plant sterols (fruits/vegetables/nuts/whole grains/legumes) may reduce your cholesterol.  Encouraged increasing fiber to a daily intake of 10-25g/day  . Recommend obtain blood work for cholesterol levels.  . Patient self care activities - Over the next 90 days, patient will: o Continue working on lifestyle modifications (diet/ exercise).   Diabetes Lab Results  Component Value Date/Time   HGBA1C 7.5 (H) 10/22/2018 04:45 AM   HGBA1C 8.5 (H) 10/05/2018 04:33 AM   . Pharmacist Clinical Goal(s): o Over the next 90 days, patient will work with PharmD and providers to achieve A1c goal <7% . Current regimen:   Metformin 50023m1 tablet BID with a meal   Novolin N, inject 40 units once daily before breakfast  Novolin R, inject 10 units 30 minutes before meals, 5 additional units with carbs or snacks  . Interventions: o We discussed: how to recognize and treat signs of hypoglycemia . Patient self care activities - Over  the next 90 days, patient will: o Check blood sugar twice daily, document, and provide at future appointments o Contact provider with any episodes of hypoglycemia  Atrial fibrillation (paroxysmal)  . Pharmacist Clinical Goal(s) o Over the next 90 days, patient will work with PharmD and providers to prevent strokes.  . Current regimen:  o Diltiazem CD 133m, 1 capsule once daily o apixaban (Eliquis) 529m 1 tablet twice daily . Interventions: o We discussed:  monitoring for signs and symptoms for bleeding (coughing up blood, prolonged nose bleeds, black, tarry stools). . Patient self care activities - Over the next 90 days, patient will: o Continue current medications as instructed.   Hypothyroidism TSH  Date  Value Ref Range Status  05/11/2019 14.81 (A) 0.41 - 5.90 Final .  Pharmacist Clinical Goal(s) o Over the next 90 days, patient will work with PharmD and providers to achieve TSH: between 0.35 to 4.5uIU/ml. . Current regimen:  o Levothyroxine 125 mcg, 1 tablet once daily  . Interventions: o We discussed:  administration of levothyroxine (30 minutes prior to first meal.) . Patient self care activities o Patient will follow up with endocrinologist.  o [facility]  Barrett's esophagus . Pharmacist Clinical Goal(s) o Over the next  days, patient will work with PharmD and providers to minimize stomach pain. . Current regimen:  . Pantoprazole 4046m1 tablet once daily . Patient self care activities o Patient will continue current medications as directed by gastroenterologist.   Anxiety . Pharmacist Clinical Goal(s) o Over the next 90 days, patient will work with PharmD and providers to minimize anxiety symptoms.  . Current regimen:  o Escitalopram (Lexapro)- 27m26m tablet once daily  . Patient self care activities o Patient will continue current medication as directed.   Migrine headache . Pharmacist Clinical Goal(s) o Over the next 90 days, patient will work with PharmD and providers to minimize reoccurrences of headaches.  . Current regimen:  o Amitriptyline 25mg11m5 tablet at bedtime  . Patient self care activities o Patient will continue current medications as directed.   Medication management . Pharmacist Clinical Goal(s): o Over the next 90 days, patient will work with PharmD and providers to maintain optimal medication adherence . Current pharmacy: [facility] . Interventions o Comprehensive medication review performed. o Continue current medication management strategy . Patient self care activities - Over the next 90 days, patient will: o Take medications as prescribed o Report any questions or concerns to PharmD and/or  provider(s)  Please see past updates related to this goal by clicking on the "Past Updates" button in the selected goal         SDOH Interventions     Most Recent Value  SDOH Interventions  Transportation Interventions Intervention Not Indicated      AFIB (paroxysmal)    Patient is currently rate controlled.  Patient has failed these meds in past: none  Patient is currently controlled on the following medications:   Diltiazem CD 127mg,81mapsule once daily  Anticoagluation  apixaban (Eliquis) 5mg, 161mblet twice daily   This patients CHA2DS2-VASc Score and unadjusted Ischemic Stroke Rate (% per year) is equal to 10.8 % stroke rate/year from a score of 8  Above score calculated as 1 point each if present [CHF, HTN, DM, Vascular=MI/PAD/Aortic Plaque, Age if 65-74, or Female] Above score calculated as 2 points each if present [Age > 75, or Stroke/TIA/TE]  CHA2DS2/VAS Stroke Risk Points  Current as of about an hour ago     9 >= 2 Points: High Risk  1 - 1.99 Points: Medium Risk  0 Points: Low Risk   We discussed:  monitoring for signs and symptoms for bleeding (coughing up blood, prolonged nose bleeds, black, tarry stools).  Plan Continue current medications.   Diabetes   Patient reports being told about a DM medication that will benefit her. She is going to follow up with endocrinologist and discuss. She also plans to check her insurance formulary.   Recent Relevant Labs: Lab Results  Component Value Date/Time   HGBA1C 7.5 (H) 10/22/2018 04:45 AM   HGBA1C 8.5 (H) 10/05/2018 04:33 AM   MICROALBUR 0.8 10/07/2015 01:30 PM   MICROALBUR 1.1 02/08/2014 02:49 PM    Checking BG: 2x per Day  Recent FBG Readings:131  Recent HS BG readings: 85   Patient has failed these meds in past: dulaglutide, glimepiride, liraglutide, pioglitazone, repaglinide, sitagliptin, semaglutide (stomach pain)   Patient is currently uncontrolled on the following medications:   Metformin  572m, 1 tablet BID with a meal   Novolin N, inject 40 units once daily before breakfast  Novolin R, inject 10 units 30 minutes before meals, 5 additional units with carbs or snacks   Last diabetic Eye exam:  Lab Results  Component Value Date/Time   HMDIABEYEEXA No Retinopathy 02/15/2016 03:21 PM   Last diabetic Foot exam: No results found for: HMDIABFOOTEX   We discussed: how to recognize and treat signs of hypoglycemia  Plan Managed by endo (Dr. AGarnet Koyanagi  Patient to obtain repeat A1c in upcoming visit.  Continue current medications   Patient to reach back if needing assistance with cost.   Hypertension  Reports dizziness/ lightheadedness.   Office blood pressures are  BP Readings from Last 3 Encounters:  10/28/19 107/65  10/22/19 121/72  10/05/19 117/63   Patient has failed these meds in the past: amlodipine, atenolol, bisoprolol, HCTZ, metoprolol, valsartan   Patient checks BP at home patient reports not checking at home due to numerous doctor visits    Patient home BP readings are ranging: NA  Patient is controlled on:   Diltiazem CD 1272m 1 capsule once daily  Losartan 5016m tablet once daily   Plan Continue current medications   Hyperlipidemia/ CAD    LDL goal < 70   Lipid Panel     Component Value Date/Time   CHOL 128 10/22/2018 0445   CHOL 156 11/21/2016 1113   TRIG 95 10/22/2018 0445   HDL 24 (L) 10/22/2018 0445   HDL 44 11/21/2016 1113   LDLCALC 85 10/22/2018 0445   LDLCALC 91 11/21/2016 1113   LDLDIRECT 98.0 05/24/2014 1343    Hepatic Function Latest Ref Rng & Units 09/29/2019 09/11/2019 08/14/2019  Total Protein 6.0 - 8.3 g/dL 7.0 7.5 7.6  Albumin 3.5 - 5.2 g/dL 4.2 4.4 4.1  AST 0 - 37 U/L 36 48(H) 48(H)  ALT 0 - 35 U/L 41(H) 55(H) 60(H)  Alk Phosphatase 39 - 117 U/L 75 72 86  Total Bilirubin 0.2 - 1.2 mg/dL 1.1 1.1 1.1  Bilirubin, Direct 0.0 - 0.3 mg/dL 0.3 - -     The ASCVD Risk score (GoMikey Bussing Jr., et al., 2013) failed to calculate  for the following reasons:   The patient has a prior MI or stroke diagnosis   Patient has failed these meds in past: statins (myalgia), Zetia   Patient is currently uncontrolled on the following medications:  . No medications  Plan Recommend repeat lipid panel and consider PCSK9 inhibitor if LDL >70. Marland Kitchen  Reassess at follow up.   Hypothyroidism   Lab Results  Component Value Date/Time   TSH 14.81 (A) 05/11/2019 12:00 AM   TSH 5.935 (H) 04/30/2019 11:49 AM   TSH 13.59 (A) 12/01/2018 12:00 AM   TSH 9.460 (H) 11/12/2018 09:39 AM   TSH 0.322 (L) 10/21/2018 03:30 PM   TSH 0.79 08/05/2017 11:52 AM   FREET4 0.89 04/30/2019 11:49 AM   FREET4 1.78 (H) 10/21/2018 03:30 PM    Patient is currently uncontrolled (based on lab results in chart) on the following medications:  . Levothyroxine 125 mcg, 1 tablet once daily   Patient reports she has had more recent blood work with Dr. [name] and reports TSH has not needed to be addressed.   We discussed:  administration of levothyroxine (30 minutes prior to first meal.   Plan Managed by endo (Dr. [name])  Continue current medications  Request blood work from Dr. Festus Holts office   Barrett's esophagus, GERD    Patient has failed these meds in past: dexlansoprazole   Patient is currently controlled on the following medications:  . Pantoprazole 46m, 1 tablet once daily  Plan Continue current medications  Managed by GI (Dr. AHavery Moros   Migraine headache/ Sleep   Patient reports sleeping much better   Patient has failed these meds in past: , bupropion, citalopram, nortriptyline,   Patient is currently controlled on the following medications:   Amitriptyline 267m 1 tablet at bedtime (0.5 tablet)   We discussed:  Indications of medications.   Plan Continue current medication.   Anxiety   Patient is currently controlled on the following medications:   Escitalopram (Lexapro)- 2055m1 tablet once daily   Plan Continue  current medications  Anemia    Hemoglobin & Hematocrit     Component Value Date/Time   HGB 10.6 (L) 09/29/2019 1345   HGB 11.0 (L) 11/12/2018 0939   HCT 32.9 (L) 09/29/2019 1345   HCT 31.6 (L) 04/30/2019 1149   Iron/TIBC/Ferritin/ %Sat    Component Value Date/Time   IRON 134 09/29/2019 1345   TIBC 305 08/14/2019 1024   FERRITIN 251.9 09/29/2019 1345   IRONPCTSAT 42.5 09/29/2019 1345    Patient has failed these meds in past: ferrous sulfate (constipation)  Patient is currently uncontrolled on the following medications:  . No medications   Patient reports received 2 IV iron infusions.   Plan Managed by oncology (KaIrene Limbo OTC/ supplement  Patient is currently on the following medications:  . MMarland Kitchenltivitamin, 1 tablet once daily   Plan Continue current medications  Vaccines   Reviewed and discussed patient's vaccination history.    Immunization History  Administered Date(s) Administered  . Fluad Quad(high Dose 65+) 01/20/2019  . Hep A / Hep B 06/15/2015, 07/15/2015, 12/27/2015  . Influenza Split 01/16/2012  . Influenza Whole 01/31/2010  . Influenza, High Dose Seasonal PF 01/28/2014, 12/29/2014, 02/17/2016, 01/21/2018  . Influenza-Unspecified 12/29/2012  . PFIZER SARS-COV-2 Vaccination 05/16/2019, 06/06/2019  . Pneumococcal Conjugate-13 06/14/2017  . Pneumococcal Polysaccharide-23 01/28/2012, 01/28/2014  . Pneumococcal-Unspecified 04/24/2015  . Tdap 12/29/2014  . Zoster 02/14/2012  . Zoster Recombinat (Shingrix) 03/13/2017, 05/24/2017   Plan Patient up-to-date.   Medication Management  Patient organizes medications: to be addressed at follow up Primary pharmacy: [facility]  Adherence: no gaps in refill history (per medication dispense history from 05/07/19 to 11/03/19)    Follow up Follow up visit with PharmD in 90 days.  Anson Crofts, PharmD Clinical Pharmacist [facility] [place] [phone]

## 2019-10-30 ENCOUNTER — Other Ambulatory Visit: Payer: Self-pay | Admitting: *Deleted

## 2019-10-30 NOTE — Patient Outreach (Signed)
[facility]) Care Management  10/30/2019  Breanna Webster 08-03-1945 [id number]   Unsuccessful telephone outreach call to patient. HIPAA identifiers obtained. Patient answered the phone and stated that she was not at home where she has her diabetic information and asked if nurse could call her back after she sees her provider at the end of this month. Explaining she will have a new A1c reading and updated information regarding her diabetes.    Plan: RN Health Coach will call patient within a month and patient agrees to future outreach calls.   [name] RN, BSN [place] [phone] [email or web address]

## 2019-11-03 ENCOUNTER — Telehealth: Payer: Self-pay

## 2019-11-03 DIAGNOSIS — F419 Anxiety disorder, unspecified: Secondary | ICD-10-CM | POA: Insufficient documentation

## 2019-11-03 DIAGNOSIS — F064 Anxiety disorder due to known physiological condition: Secondary | ICD-10-CM | POA: Insufficient documentation

## 2019-11-03 NOTE — Patient Instructions (Addendum)
Visit Information  Goals Addressed            This Visit's Progress   . Pharmacy Care Plan       CARE PLAN ENTRY (see longitudinal plan of care for additional care plan information)  Current Barriers:  . Chronic Disease Management support, education, and care coordination needs related to Hypertension, Hyperlipidemia, Diabetes, Atrial Fibrillation, Coronary Artery Disease, Hypothyroidism, Anxiety, and Barrett's esophagus, history of CVA.   Hypertension BP Readings from Last 3 Encounters:  10/05/19 117/63  09/14/19 125/75  09/11/19 132/65   . Pharmacist Clinical Goal(s): o Over the next 90 days, patient will work with PharmD and providers to maintain BP goal <130/80 . Current regimen:   Diltiazem CD 186m, 1 capsule once daily  Losartan 570m1 tablet once daily  . Patient self care activities - Over the next 90 days, patient will: o Check BP as instructed, document, and provide at future appointment  Hyperlipidemia/ CAD/ History of CVA Lab Results  Component Value Date/Time   LDLCALC 85 10/22/2018 04:45 AM   LDLCALC 91 11/21/2016 11:13 AM   LDLDIRECT 98.0 05/24/2014 01:43 PM   . Pharmacist Clinical Goal(s): o Over the next 90 days, patient will work with PharmD and providers to achieve LDL goal < 70 . Current regimen:  o No medications . Interventions: . How to reduce cholesterol through diet/weight management and physical activity.    . How a diet high in plant sterols (fruits/vegetables/nuts/whole grains/legumes) may reduce your cholesterol.  Encouraged increasing fiber to a daily intake of 10-25g/day  . Recommend obtain blood work for cholesterol levels.  . Patient self care activities - Over the next 90 days, patient will: o Continue working on lifestyle modifications (diet/ exercise).   Diabetes Lab Results  Component Value Date/Time   HGBA1C 7.5 (H) 10/22/2018 04:45 AM   HGBA1C 8.5 (H) 10/05/2018 04:33 AM   . Pharmacist Clinical Goal(s): o Over the next 90  days, patient will work with PharmD and providers to achieve A1c goal <7% . Current regimen:   Metformin 50071m1 tablet BID with a meal   Novolin N, inject 40 units once daily before breakfast  Novolin R, inject 10 units 30 minutes before meals, 5 additional units with carbs or snacks  . Interventions: o We discussed: how to recognize and treat signs of hypoglycemia . Patient self care activities - Over the next 90 days, patient will: o Check blood sugar twice daily, document, and provide at future appointments o Contact provider with any episodes of hypoglycemia  Atrial fibrillation (paroxysmal)  . Pharmacist Clinical Goal(s) o Over the next 90 days, patient will work with PharmD and providers to prevent strokes.  . Current regimen:  o Diltiazem CD 120m17m capsule once daily o apixaban (Eliquis) 5mg,7mtablet twice daily . Interventions: o We discussed:  monitoring for signs and symptoms for bleeding (coughing up blood, prolonged nose bleeds, black, tarry stools). . Patient self care activities - Over the next 90 days, patient will: o Continue current medications as instructed.   Hypothyroidism TSH  Date Value Ref Range Status  05/11/2019 14.81 (A) 0.41 - 5.90 Final .  Pharmacist Clinical Goal(s) o Over the next 90 days, patient will work with PharmD and providers to achieve TSH: between 0.35 to 4.5uIU/ml. . Current regimen:  o Levothyroxine 125 mcg, 1 tablet once daily  . Interventions: o We discussed:  administration of levothyroxine (30 minutes prior to first meal.) . Patient self care activities o Patient  will follow up with endocrinologist.  o Request recent lab work to be sent to [facility] at [place].   Barrett's esophagus . Pharmacist Clinical Goal(s) o Over the next  days, patient will work with PharmD and providers to minimize stomach pain. . Current regimen:  . Pantoprazole 17m, 1 tablet once daily . Patient self care activities o Patient will continue  current medications as directed by gastroenterologist.   Anxiety . Pharmacist Clinical Goal(s) o Over the next 90 days, patient will work with PharmD and providers to minimize anxiety symptoms.  . Current regimen:  o Escitalopram (Lexapro)- 250m 1 tablet once daily  . Patient self care activities o Patient will continue current medication as directed.   Migrine headache . Pharmacist Clinical Goal(s) o Over the next 90 days, patient will work with PharmD and providers to minimize reoccurrences of headaches.  . Current regimen:  o Amitriptyline 2571m0.5 tablet at bedtime  . Patient self care activities o Patient will continue current medications as directed.   Medication management . Pharmacist Clinical Goal(s): o Over the next 90 days, patient will work with PharmD and providers to maintain optimal medication adherence . Current pharmacy: [facility] . Interventions o Comprehensive medication review performed. o Continue current medication management strategy . Patient self care activities - Over the next 90 days, patient will: o Take medications as prescribed o Report any questions or concerns to PharmD and/or provider(s)  Please see past updates related to this goal by clicking on the "Past Updates" button in the selected goal         The patient verbalized understanding of instructions provided today and agreed to receive a mailed copy of patient instruction and/or educational materials.  The pharmacy team will reach out to the patient again over the next 90 days.   AnnAnson CroftsharmD Clinical Pharmacist [phone] Heart-Healthy Eating Plan Heart-healthy meal planning includes:  Eating less unhealthy fats.  Eating more healthy fats.  Making other changes in your diet. Talk with your doctor or a diet specialist (dietitian) to create an eating plan that is right for you. What is my plan? Your doctor may  recommend an eating plan that includes:  Total fat: ______% or less of total calories a day.  Saturated fat: ______% or less of total calories a day.  Cholesterol: less than _________mg a day. What are tips for following this plan? Cooking Avoid frying your food. Try to bake, boil, grill, or broil it instead. You can also reduce fat by:  Removing the skin from poultry.  Removing all visible fats from meats.  Steaming vegetables in water or broth. Meal planning   At meals, divide your plate into four equal parts: ? Fill one-half of your plate with vegetables and green salads. ? Fill one-fourth of your plate with whole grains. ? Fill one-fourth of your plate with lean protein foods.  Eat 4-5 servings of vegetables per day. A serving of vegetables is: ? 1 cup of raw or cooked vegetables. ? 2 cups of raw leafy greens.  Eat 4-5 servings of fruit per day. A serving of fruit is: ? 1 medium whole fruit. ?  cup of dried fruit. ?  cup of fresh, frozen, or canned fruit. ?  cup of 100% fruit juice.  Eat more foods that have soluble fiber. These are apples, broccoli, carrots, beans, peas, and barley. Try to get 20-30 g of fiber per day.  Eat 4-5  servings of nuts, legumes, and seeds per week: ? 1 serving of dried beans or legumes equals  cup after being cooked. ? 1 serving of nuts is  cup. ? 1 serving of seeds equals 1 tablespoon. General information  Eat more home-cooked food. Eat less restaurant, buffet, and fast food.  Limit or avoid alcohol.  Limit foods that are high in starch and sugar.  Avoid fried foods.  Lose weight if you are overweight.  Keep track of how much salt (sodium) you eat. This is important if you have high blood pressure. Ask your doctor to tell you more about this.  Try to add vegetarian meals each week. Fats  Choose healthy fats. These include olive oil and canola oil, flaxseeds, walnuts, almonds, and seeds.  Eat more omega-3 fats. These  include salmon, mackerel, sardines, tuna, flaxseed oil, and ground flaxseeds. Try to eat fish at least 2 times each week.  Check food labels. Avoid foods with trans fats or high amounts of saturated fat.  Limit saturated fats. ? These are often found in animal products, such as meats, butter, and cream. ? These are also found in plant foods, such as palm oil, palm kernel oil, and coconut oil.  Avoid foods with partially hydrogenated oils in them. These have trans fats. Examples are stick margarine, some tub margarines, cookies, crackers, and other baked goods. What foods can I eat? Fruits All fresh, canned (in natural juice), or frozen fruits. Vegetables Fresh or frozen vegetables (raw, steamed, roasted, or grilled). Green salads. Grains Most grains. Choose whole wheat and whole grains most of the time. Rice and pasta, including brown rice and pastas made with whole wheat. Meats and other proteins Lean, well-trimmed beef, veal, pork, and lamb. Chicken and [place] without skin. All fish and shellfish. Wild duck, rabbit, pheasant, and venison. Egg whites or low-cholesterol egg substitutes. Dried beans, peas, lentils, and tofu. Seeds and most nuts. Dairy Low-fat or nonfat cheeses, including ricotta and mozzarella. Skim or 1% milk that is liquid, powdered, or evaporated. Buttermilk that is made with low-fat milk. Nonfat or low-fat yogurt. Fats and oils Non-hydrogenated (trans-free) margarines. Vegetable oils, including soybean, sesame, sunflower, olive, peanut, safflower, corn, canola, and cottonseed. Salad dressings or mayonnaise made with a vegetable oil. Beverages Mineral water. Coffee and tea. Diet carbonated beverages. Sweets and desserts Sherbet, gelatin, and fruit ice. Small amounts of dark chocolate. Limit all sweets and desserts. Seasonings and condiments All seasonings and condiments. The items listed above may not be a complete list of foods and drinks you can eat. Contact a  dietitian for more options. What foods should I avoid? Fruits Canned fruit in heavy syrup. Fruit in cream or butter sauce. Fried fruit. Limit coconut. Vegetables Vegetables cooked in cheese, cream, or butter sauce. Fried vegetables. Grains Breads that are made with saturated or trans fats, oils, or whole milk. Croissants. Sweet rolls. Donuts. High-fat crackers, such as cheese crackers. Meats and other proteins Fatty meats, such as hot dogs, ribs, sausage, bacon, rib-eye roast or steak. High-fat deli meats, such as salami and bologna. Caviar. Domestic duck and goose. Organ meats, such as liver. Dairy Cream, sour cream, cream cheese, and creamed cottage cheese. Whole-milk cheeses. Whole or 2% milk that is liquid, evaporated, or condensed. Whole buttermilk. Cream sauce or high-fat cheese sauce. Yogurt that is made from whole milk. Fats and oils Meat fat, or shortening. Cocoa butter, hydrogenated oils, palm oil, coconut oil, palm kernel oil. Solid fats and shortenings, including bacon fat, salt pork,  lard, and butter. Nondairy cream substitutes. Salad dressings with cheese or sour cream. Beverages Regular sodas and juice drinks with added sugar. Sweets and desserts Frosting. Pudding. Cookies. Cakes. Pies. Milk chocolate or white chocolate. Buttered syrups. Full-fat ice cream or ice cream drinks. The items listed above may not be a complete list of foods and drinks to avoid. Contact a dietitian for more information. Summary  Heart-healthy meal planning includes eating less unhealthy fats, eating more healthy fats, and making other changes in your diet.  Eat a balanced diet. This includes fruits and vegetables, low-fat or nonfat dairy, lean protein, nuts and legumes, whole grains, and heart-healthy oils and fats. This information is not intended to replace advice given to you by your health care provider. Make sure you discuss any questions you have with your health care provider. Document  Revised: 06/13/2017 Document Reviewed: 05/17/2017 Elsevier Patient Education  2020 [employer].

## 2019-11-03 NOTE — Telephone Encounter (Signed)
-----   Message from Loralie Champagne, PA-C sent at 11/03/2019 11:54 AM EDT ----- Please touch base with the patient and see how she is doing. Please let her know that Dr. Havery Moros reviewed her chart as well and is reassured by her CT scan, labs, and recent colonoscopy.  That being said, however, she is still having pain then he said that he could see her back in a few weeks to reassess.  Thank you, Jess  ----- Message ----- From: Yetta Flock, MD Sent: 10/28/2019   3:27 PM EDT To: Loralie Champagne, PA-C  Hey Jess, Sorry was out of town and just seeing this now. I wrote a note back to you. This sounds musculoskeletal to me. If this persist she can see me back in a few weeks and I can examine her, she if she is a candidate for trigger point injection or not, and provide reassurance, thanks ----- Message ----- From: Loralie Champagne, PA-C Sent: 10/22/2019   3:48 PM EDT To: Yetta Flock, MD  Hello.  I saw this patient in clinic today for her abdominal pain.  It is localized to the right mid abdomen, but when I examined her abdomen she was somewhat tender diffusely, just a little more so in the right mid abdominal area.  You can read my note, but she describes the pain as a constant aching nagging pain.  Does not get better or worse with movement, eating, bowel habits, etc.  She is not taking MiraLAX because she says she is moving her bowels well.  She says that this has been going on for a few months although there was no mention of it in your office visit note from the end of April from what I could see.  I honestly am not sure what is causing her pain.  I spent an extensive amount of time today trying to reassure her that it was nothing bad causing her pain.  I do not think this is related to her reflux or esophagitis and she does not feel like the Carafate is helping so I told her to discontinue it.  I told her that I would send you a message to see what your thoughts are.  CT scan did show  some subcutaneous stranding related to her insulin injections.  I mentioned that could be causing some inflammatory issues and causing her discomfort or possibly adhesions/scar tissue from previous surgeries.  Please help!  I am actually leaving to go out of town so could you please also CC any responses to Jan or your nurse?  Thank you,  Jess

## 2019-11-04 NOTE — Telephone Encounter (Signed)
Left message on machine to call back  

## 2019-11-05 NOTE — Telephone Encounter (Signed)
I have tried to reach the pt by phone on several occasions.  I will send a My Chart message to the pt.

## 2019-11-09 DIAGNOSIS — E039 Hypothyroidism, unspecified: Secondary | ICD-10-CM | POA: Diagnosis not present

## 2019-11-09 DIAGNOSIS — E1149 Type 2 diabetes mellitus with other diabetic neurological complication: Secondary | ICD-10-CM | POA: Diagnosis not present

## 2019-11-10 ENCOUNTER — Ambulatory Visit: Payer: BLUE CROSS/BLUE SHIELD | Admitting: *Deleted

## 2019-11-12 DIAGNOSIS — I4891 Unspecified atrial fibrillation: Secondary | ICD-10-CM | POA: Diagnosis not present

## 2019-11-12 DIAGNOSIS — Z794 Long term (current) use of insulin: Secondary | ICD-10-CM | POA: Diagnosis not present

## 2019-11-12 DIAGNOSIS — Z952 Presence of prosthetic heart valve: Secondary | ICD-10-CM | POA: Diagnosis not present

## 2019-11-12 DIAGNOSIS — E039 Hypothyroidism, unspecified: Secondary | ICD-10-CM | POA: Diagnosis not present

## 2019-11-12 DIAGNOSIS — Z6831 Body mass index (BMI) 31.0-31.9, adult: Secondary | ICD-10-CM | POA: Diagnosis not present

## 2019-11-12 DIAGNOSIS — I2510 Atherosclerotic heart disease of native coronary artery without angina pectoris: Secondary | ICD-10-CM | POA: Diagnosis not present

## 2019-11-12 DIAGNOSIS — Z8673 Personal history of transient ischemic attack (TIA), and cerebral infarction without residual deficits: Secondary | ICD-10-CM | POA: Diagnosis not present

## 2019-11-12 DIAGNOSIS — E1149 Type 2 diabetes mellitus with other diabetic neurological complication: Secondary | ICD-10-CM | POA: Diagnosis not present

## 2019-11-13 ENCOUNTER — Other Ambulatory Visit: Payer: Self-pay | Admitting: *Deleted

## 2019-11-13 NOTE — Patient Outreach (Signed)
[facility]) Care Management  11/13/2019  Breanna Webster 28-Jun-1945 [id number]  Unsuccessful outreach attempt made to patient. RN Health Coach left HIPAA compliant voicemail message along with her contact information.  Plan: RN Health Coach will call patient within the month of August.  [name] RN, BSN [place] [phone] [email or web address]

## 2019-11-16 ENCOUNTER — Other Ambulatory Visit: Payer: Self-pay | Admitting: *Deleted

## 2019-11-16 NOTE — Patient Outreach (Signed)
[facility]) Care Management  11/16/2019  Breanna Webster 06-Apr-1946 [id number]  Unsuccessful outreach attempt made to patient. Nurse called patient to follow-up after the patient called [place] nurse 11/15/19 to inquire about her newly prescribed insulin. RN Health Coach left HIPAA compliant voicemail message along with her contact information and requested that patient call her back if she needed assistance or had any further questions or concerns.    [name] RN, BSN [place] [phone] [email or web address]

## 2019-11-17 ENCOUNTER — Ambulatory Visit: Payer: Medicare Other | Admitting: Gastroenterology

## 2019-11-24 ENCOUNTER — Other Ambulatory Visit: Payer: Self-pay | Admitting: Adult Health

## 2019-11-25 ENCOUNTER — Ambulatory Visit (INDEPENDENT_AMBULATORY_CARE_PROVIDER_SITE_OTHER): Payer: Medicare Other | Admitting: Family Medicine

## 2019-11-27 ENCOUNTER — Encounter: Payer: Self-pay | Admitting: Adult Health

## 2019-11-27 ENCOUNTER — Ambulatory Visit (INDEPENDENT_AMBULATORY_CARE_PROVIDER_SITE_OTHER)
Admission: RE | Admit: 2019-11-27 | Discharge: 2019-11-27 | Disposition: A | Discharge status: Home / Self Care | Payer: Medicare Other | Source: Ambulatory Visit | Attending: Adult Health | Admitting: Adult Health

## 2019-11-27 ENCOUNTER — Ambulatory Visit (INDEPENDENT_AMBULATORY_CARE_PROVIDER_SITE_OTHER): Payer: Medicare Other | Admitting: Adult Health

## 2019-11-27 ENCOUNTER — Other Ambulatory Visit: Payer: Self-pay

## 2019-11-27 VITALS — BP 140/70 | Temp 97.8°F | Wt 181.0 lb

## 2019-11-27 DIAGNOSIS — R200 Anesthesia of skin: Secondary | ICD-10-CM | POA: Diagnosis not present

## 2019-11-27 DIAGNOSIS — I517 Cardiomegaly: Secondary | ICD-10-CM | POA: Diagnosis not present

## 2019-11-27 DIAGNOSIS — I2510 Atherosclerotic heart disease of native coronary artery without angina pectoris: Secondary | ICD-10-CM | POA: Diagnosis not present

## 2019-11-27 DIAGNOSIS — R202 Paresthesia of skin: Secondary | ICD-10-CM

## 2019-11-27 DIAGNOSIS — M47814 Spondylosis without myelopathy or radiculopathy, thoracic region: Secondary | ICD-10-CM | POA: Diagnosis not present

## 2019-11-27 DIAGNOSIS — J9811 Atelectasis: Secondary | ICD-10-CM | POA: Diagnosis not present

## 2019-11-27 NOTE — Progress Notes (Signed)
Subjective:    Patient ID: Breanna Webster, female    DOB: [birth year], 74 y.o.   MRN: [id number]  HPI 74 year old female who  has a past medical history of Anxiety, Arthritis, Back pain, Blood transfusion without reported diagnosis, CAD (coronary artery disease), Carotid artery stenosis, Chest pain, Chronic lower back pain, Cirrhosis ([place]), Colon polyps, Diverticulitis, Diverticulosis, Esophageal thickening, Fatty liver, GERD (gastroesophageal reflux disease), Grave's disease, History of colonic polyps (05/22/2017), History of hiatal hernia, Hypertension, Hypothyroidism, IBS (irritable bowel syndrome), Osteopenia, Pulmonary nodules, S/P TAVR (transcatheter aortic valve replacement), Severe aortic stenosis, Shortness of breath on exertion, Stroke ([place]), Thalassemia minor, Thyroid disease, and Type II diabetes mellitus ([place]).  She presents to the office today for an acute issue of pain in bilateral axilla.  She reports that over the last 2 weeks it "feels like there is always pins-and-needles under my arms and in my armpits".  This pain is constant and prevents her from getting a good nights rest.  She does have a history of herniated disks in lumbar spine   Review of Systems See HPI   Past Medical History:  Diagnosis Date  . Anxiety   . Arthritis    "back" (04/22/2018)  . Back pain   . Blood transfusion without reported diagnosis   . CAD (coronary artery disease)    a. 03/2018 s/p PCI/DES to the RCA (3.0x15 Onyx DES).  . Carotid artery stenosis    Mild  . Chest pain   . Chronic lower back pain   . Cirrhosis ([place])   . Colon polyps   . Diverticulitis   . Diverticulosis   . Esophageal thickening    seen on pre TAVR CT scan, also questionable cirrhosis. MRI recommended. Will refer to GI  . Fatty liver   . GERD (gastroesophageal reflux disease)   . Grave's disease   . History of colonic polyps 05/22/2017  . History of hiatal hernia   . Hypertension   . Hypothyroidism   . IBS  (irritable bowel syndrome)   . Osteopenia   . Pulmonary nodules    seen on pre TAVR CT. likley benign. no follow up recommended if pt low risk.  . S/P TAVR (transcatheter aortic valve replacement)   . Severe aortic stenosis   . Shortness of breath on exertion   . Stroke ([place])   . Thalassemia minor   . Thyroid disease   . Type II diabetes mellitus ([place])     Social History   Socioeconomic History  . Marital status: Married    Spouse name: Not on file  . Number of children: 2  . Years of education: Not on file  . Highest education level: Not on file  Occupational History  . Occupation: Tree surgeon of the [employer]  Tobacco Use  . Smoking status: Never Smoker  . Smokeless tobacco: Never Used  Vaping Use  . Vaping Use: Never used  Substance and Sexual Activity  . Alcohol use: No    Alcohol/week: 0.0 standard drinks  . Drug use: Never  . Sexual activity: Not Currently  Other Topics Concern  . Not on file  Social History Narrative   Lives with husband in a one story home.     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   Education: college.   Social Determinants of Health   Financial Resource Strain: Low Risk   . Difficulty of Paying Living Expenses:  Not very hard  Food Insecurity: No Food Insecurity  . Worried About Charity fundraiser in the Last Year: Never true  . Ran Out of Food in the Last Year: Never true  Transportation Needs: No Transportation Needs  . Lack of Transportation (Medical): No  . Lack of Transportation (Non-Medical): No  Physical Activity: Insufficiently Active  . Days of Exercise per Week: 6 days  . Minutes of Exercise per Session: 20 min  Stress:   . Feeling of Stress :   Social Connections:   . Frequency of Communication with Friends and Family:   . Frequency of Social Gatherings with Friends and Family:   . Attends Religious Services:   . Active Member of  Clubs or Organizations:   . Attends Archivist Meetings:   Marland Kitchen Marital Status:   Intimate Partner Violence:   . Fear of Current or Ex-Partner:   . Emotionally Abused:   Marland Kitchen Physically Abused:   . Sexually Abused:     Past Surgical History:  Procedure Laterality Date  . [place] STUDY N/A 03/03/2018   Procedure: [place] STUDY;  Surgeon: Mauri Pole, MD;  [facility]: [facility];  Service: Endoscopy;  Laterality: N/A;  . COLONOSCOPY    . COLONOSCOPY W/ BIOPSIES AND POLYPECTOMY    . CORONARY ANGIOGRAPHY Right 04/21/2018   Procedure: CORONARY ANGIOGRAPHY (CATH LAB);  Surgeon: [name], MD;  Location: [place] [facility];  Service: Cardiovascular;  Laterality: Right;  . CORONARY STENT INTERVENTION N/A 04/22/2018   Procedure: CORONARY STENT INTERVENTION;  Surgeon: [name], MD;  Location: [place] [facility];  Service: Cardiovascular;  Laterality: N/A;  . DILATION AND CURETTAGE OF UTERUS    . ESOPHAGEAL MANOMETRY N/A 03/03/2018   Procedure: ESOPHAGEAL MANOMETRY (EM);  Surgeon: Mauri Pole, MD;  [facility];  Service: Endoscopy;  Laterality: N/A;  . GASTRIC FUNDOPLICATION    . HERNIA REPAIR    . HYSTEROSCOPY     fibroids  . LAPAROSCOPIC CHOLECYSTECTOMY    . LAPAROSCOPY     fibroids  . NISSEN FUNDOPLICATION  8527P  . POLYPECTOMY    . RIGHT/LEFT HEART CATH AND CORONARY ANGIOGRAPHY N/A 02/20/2018   Procedure: RIGHT/LEFT HEART CATH AND CORONARY ANGIOGRAPHY;  Surgeon: [name], MD;  Location: [place] [facility];  Service: Cardiovascular;  Laterality: N/A;  . TEE WITHOUT CARDIOVERSION N/A 07/08/2018   Procedure: TRANSESOPHAGEAL ECHOCARDIOGRAM (TEE);  Surgeon: Burnell Blanks, MD;  Location: [facility];  Service: Open Heart Surgery;  Laterality: N/A;  . TEE WITHOUT CARDIOVERSION  10/07/2018  . TEE WITHOUT CARDIOVERSION N/A 10/07/2018   Procedure: TRANSESOPHAGEAL ECHOCARDIOGRAM (TEE);  Surgeon: Jerline Pain, MD;  Location: [facility] END[facility]COPY;   Service: Cardiovascular;  Laterality: N/A;  . TONSILLECTOMY    . TRANSCATHETER AORTIC VALVE REPLACEMENT, TRANSFEMORAL N/A 07/08/2018   Procedure: TRANSCATHETER AORTIC VALVE REPLACEMENT, TRANSFEMORAL;  Surgeon: Burnell Blanks, MD;  Location: [facility];  Service: Open Heart Surgery;  Laterality: N/A;    Family History  Adopted: Yes  Problem Relation Age of Onset  . Healthy Son        x 2  . Headache Other        Cluster headaches  . Heart failure Mother   . Colon cancer Neg Hx   . Pancreatic cancer Neg Hx   . Rectal cancer Neg Hx   . Stomach cancer Neg Hx     Allergies  Allergen Reactions  . Statins Other (See  Comments)    Muscle aches    Current Outpatient Medications on File Prior to Visit  Medication Sig Dispense Refill  . amitriptyline (ELAVIL) 25 MG tablet TAKE 1 TABLET(25 MG) BY MOUTH AT BEDTIME 90 tablet 1  . BD INSULIN SYRINGE U/F 31G X 5/16" 1 ML MISC USE TO INJECT INSULIN EVERY DAY 100 each 3  . Cholecalciferol (VITAMIN D3 PO) Take 1 tablet by mouth daily.    Marland Kitchen diltiazem (CARDIZEM CD) 120 MG 24 hr capsule TAKE 1 CAPSULE(120 MG) BY MOUTH DAILY (Patient taking differently: Take 120 mg by mouth every evening. ) 90 capsule 1  . ELIQUIS 5 MG TABS tablet TAKE 1 TABLET BY MOUTH TWICE DAILY 180 tablet 1  . escitalopram (LEXAPRO) 20 MG tablet TAKE 1 TABLET(20 MG) BY MOUTH DAILY 90 tablet 1  . Lancets (ONETOUCH DELICA PLUS [id number]) MISC USE TO CHECK BLOOD SUGAR TWICE DAILY.. 200 each 0  . losartan (COZAAR) 50 MG tablet TAKE 1 TABLET(50 MG) BY MOUTH DAILY 90 tablet 1  . metFORMIN (GLUCOPHAGE) 500 MG tablet Take 500 mg by mouth 2 (two) times daily with a meal.     . Multiple Vitamin (MULITIVITAMIN WITH MINERALS) TABS Take 1 tablet by mouth daily.    . nitroGLYCERIN (NITROSTAT) 0.4 MG SL tablet Place 0.4 mg under the tongue every 5 (five) minutes as needed for chest pain. (Patient not taking: Reported on 10/29/2019)    . NOVOLIN N 100 UNIT/ML injection Inject 40 Units into the  skin daily before breakfast.     . NOVOLIN R 100 UNIT/ML injection 10 UNITS THIRTY MINUTES BEFORE MEALS.  5 ADDITIONAL UNITS WITH CARBS OR SNACKS.    [name] VERIO test strip USE TO MONITOR GLUCOSE LEVELS TWICE DAILY 200 strip 3  . pantoprazole (PROTONIX) 40 MG tablet Take 1 tablet (40 mg total) by mouth daily. 90 tablet 3  . sucralfate (CARAFATE) 1 GM/10ML suspension Take 10 mLs (1 g total) by mouth 4 (four) times daily. (Patient not taking: Reported on 10/29/2019) 420 mL 1  . SYNTHROID 125 MCG tablet Take 125 mcg by mouth every morning.     No current facility-administered medications on file prior to visit.    BP 140/70   Temp 97.8 F (36.6 C)   Wt 181 lb (82.1 kg)   BMI 30.12 kg/m       Objective:   Physical Exam Vitals and nursing note reviewed.  Constitutional:      Appearance: Normal appearance. She is obese.  Cardiovascular:     Rate and Rhythm: Normal rate and regular rhythm.     Pulses: Normal pulses.     Heart sounds: Normal heart sounds.  Pulmonary:     Effort: Pulmonary effort is normal.     Breath sounds: Normal breath sounds.  Skin:    General: Skin is warm and dry.     Capillary Refill: Capillary refill takes less than 2 seconds.     Comments: No signs of abscess, redness, warmth, lesions, or masses noted under bilateral arms or in axilla  Neurological:     General: No focal deficit present.     Mental Status: She is alert and oriented to person, place, and time.     Comments: No loss of UE strength   Psychiatric:        Mood and Affect: Mood normal.        Behavior: Behavior normal.        Thought Content: Thought content normal.  Judgment: Judgment normal.       Assessment & Plan:  1. Numbness and tingling -Unknown cause.  Completely normal exam except for some mild tenderness with palpation to right axilla.  No signs of masses or infection.  Wondering if this is a nerve impingement.  Will start off with x-rays of thoracic, chest, and cervical  spine.  She was advised to increase her Elavil nightly. - DG Thoracic Spine W/Swimmers; Future - DG Chest 2 View; Future - DG Cervical Spine Complete; Future   [name], NP

## 2019-11-30 ENCOUNTER — Other Ambulatory Visit: Payer: Self-pay | Admitting: Adult Health

## 2019-11-30 ENCOUNTER — Telehealth: Payer: Self-pay | Admitting: Adult Health

## 2019-11-30 DIAGNOSIS — R200 Anesthesia of skin: Secondary | ICD-10-CM

## 2019-11-30 DIAGNOSIS — R202 Paresthesia of skin: Secondary | ICD-10-CM

## 2019-11-30 MED ORDER — GABAPENTIN 300 MG PO CAPS
300.0000 mg | ORAL_CAPSULE | Freq: Three times a day (TID) | ORAL | 0 refills | Status: DC
Start: 2019-11-30 — End: 2019-12-21

## 2019-11-30 NOTE — Telephone Encounter (Signed)
Spoke to the patient and informed her of her x-rays.  Cervical spine and chest x-ray were normal.  Thoracic spine x-ray showed  IMPRESSION: 1. Mild-to-moderate severity multilevel degenerative changes in the thoracic spine.  I believe she may have a nerve impingement then thoracic spine that is causing her discomfort in her axilla and bilateral flanks.  Will refer to neurosurgery and prescribe short course of gabapentin to see if this helps with her pain.  Was also recommended to have an MRI as well as physical therapy but she refuses until she is seen by neurosurgery

## 2019-12-03 ENCOUNTER — Encounter: Payer: Self-pay | Admitting: Adult Health

## 2019-12-09 DIAGNOSIS — I10 Essential (primary) hypertension: Secondary | ICD-10-CM | POA: Diagnosis not present

## 2019-12-09 DIAGNOSIS — Z6830 Body mass index (BMI) 30.0-30.9, adult: Secondary | ICD-10-CM | POA: Diagnosis not present

## 2019-12-09 DIAGNOSIS — M79621 Pain in right upper arm: Secondary | ICD-10-CM | POA: Diagnosis not present

## 2019-12-11 ENCOUNTER — Other Ambulatory Visit: Payer: Self-pay | Admitting: Adult Health

## 2019-12-11 DIAGNOSIS — E118 Type 2 diabetes mellitus with unspecified complications: Secondary | ICD-10-CM

## 2019-12-14 ENCOUNTER — Other Ambulatory Visit: Payer: Self-pay | Admitting: Adult Health

## 2019-12-14 ENCOUNTER — Other Ambulatory Visit: Payer: Self-pay | Admitting: *Deleted

## 2019-12-14 DIAGNOSIS — Z760 Encounter for issue of repeat prescription: Secondary | ICD-10-CM

## 2019-12-14 NOTE — Patient Outreach (Signed)
[facility]) Care Management  12/14/2019  Breanna Webster 1945-09-06 [id number]  Unsuccessful outreach attempt made to patient. RN Health Coach left HIPAA compliant voicemail message along with her contact information.  Plan: RN Health Coach will call patient within the month of September.  [name] RN, BSN [place] [phone] [email or web address]

## 2019-12-15 ENCOUNTER — Other Ambulatory Visit: Payer: Self-pay | Admitting: Physician Assistant

## 2019-12-15 DIAGNOSIS — M79621 Pain in right upper arm: Secondary | ICD-10-CM

## 2019-12-17 ENCOUNTER — Encounter: Payer: Self-pay | Admitting: Adult Health

## 2019-12-18 ENCOUNTER — Other Ambulatory Visit: Payer: Self-pay

## 2019-12-18 ENCOUNTER — Ambulatory Visit (INDEPENDENT_AMBULATORY_CARE_PROVIDER_SITE_OTHER): Payer: Medicare Other | Admitting: Adult Health

## 2019-12-18 ENCOUNTER — Encounter: Payer: Self-pay | Admitting: Adult Health

## 2019-12-18 VITALS — BP 138/74 | Temp 98.4°F | Wt 186.0 lb

## 2019-12-18 DIAGNOSIS — I2510 Atherosclerotic heart disease of native coronary artery without angina pectoris: Secondary | ICD-10-CM | POA: Diagnosis not present

## 2019-12-18 DIAGNOSIS — R300 Dysuria: Secondary | ICD-10-CM | POA: Diagnosis not present

## 2019-12-18 DIAGNOSIS — N3001 Acute cystitis with hematuria: Secondary | ICD-10-CM | POA: Diagnosis not present

## 2019-12-18 LAB — POCT URINALYSIS DIPSTICK
Bilirubin, UA: NEGATIVE
Blood, UA: POSITIVE
Glucose, UA: POSITIVE — AB
Ketones, UA: NEGATIVE
Nitrite, UA: POSITIVE
Protein, UA: POSITIVE — AB
Spec Grav, UA: 1.02 (ref 1.010–1.025)
Urobilinogen, UA: 0.2 E.U./dL
pH, UA: 5.5 (ref 5.0–8.0)

## 2019-12-18 MED ORDER — AMOXICILLIN-POT CLAVULANATE 875-125 MG PO TABS: 1.0000 | ORAL_TABLET | Freq: Two times a day (BID) | ORAL | 0 refills | Status: AC

## 2019-12-18 NOTE — Progress Notes (Signed)
Subjective:    Patient ID: Breanna Webster, female    DOB: 1945/08/09, 74 y.o.   MRN: [id number]  HPI  74 year old female who  has a past medical history of Anxiety, Arthritis, Back pain, Blood transfusion without reported diagnosis, CAD (coronary artery disease), Carotid artery stenosis, Chest pain, Chronic lower back pain, Cirrhosis ([place]), Colon polyps, Diverticulitis, Diverticulosis, Esophageal thickening, Fatty liver, GERD (gastroesophageal reflux disease), Grave's disease, History of colonic polyps (05/22/2017), History of hiatal hernia, Hypertension, Hypothyroidism, IBS (irritable bowel syndrome), Osteopenia, Pulmonary nodules, S/P TAVR (transcatheter aortic valve replacement), Severe aortic stenosis, Shortness of breath on exertion, Stroke ([place]), Thalassemia minor, Thyroid disease, and Type II diabetes mellitus ([place]).  She presents to the office today for concern of a urinary tract infection.  Her symptoms have been present for roughly a week and a half.  Symptoms include dysuria, urinary frequency, urgency, odorous urine, lower pelvic pressure, fatigue, and feeling not well.  Denies fevers, lower back pain  Review of Systems See HPI   Past Medical History:  Diagnosis Date  . Anxiety   . Arthritis    "back" (04/22/2018)  . Back pain   . Blood transfusion without reported diagnosis   . CAD (coronary artery disease)    a. 03/2018 s/p PCI/DES to the RCA (3.0x15 Onyx DES).  . Carotid artery stenosis    Mild  . Chest pain   . Chronic lower back pain   . Cirrhosis ([place])   . Colon polyps   . Diverticulitis   . Diverticulosis   . Esophageal thickening    seen on pre TAVR CT scan, also questionable cirrhosis. MRI recommended. Will refer to GI  . Fatty liver   . GERD (gastroesophageal reflux disease)   . Grave's disease   . History of colonic polyps 05/22/2017  . History of hiatal hernia   . Hypertension   . Hypothyroidism   . IBS (irritable bowel syndrome)   . Osteopenia   .  Pulmonary nodules    seen on pre TAVR CT. likley benign. no follow up recommended if pt low risk.  . S/P TAVR (transcatheter aortic valve replacement)   . Severe aortic stenosis   . Shortness of breath on exertion   . Stroke ([place])   . Thalassemia minor   . Thyroid disease   . Type II diabetes mellitus ([place])     Social History   Socioeconomic History  . Marital status: Married    Spouse name: Not on file  . Number of children: 2  . Years of education: Not on file  . Highest education level: Not on file  Occupational History  . Occupation: Tree surgeon of the [employer]  Tobacco Use  . Smoking status: Never Smoker  . Smokeless tobacco: Never Used  Vaping Use  . Vaping Use: Never used  Substance and Sexual Activity  . Alcohol use: No    Alcohol/week: 0.0 standard drinks  . Drug use: Never  . Sexual activity: Not Currently  Other Topics Concern  . Not on file  Social History Narrative   Lives with husband in a one story home.     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   Education: college.   Social Determinants of Health   Financial Resource Strain: Low Risk   . Difficulty of Paying Living Expenses: Not very hard  Food Insecurity: No Food Insecurity  . Worried About Estate manager/land agent  of Food in the Last Year: Never true  . Ran Out of Food in the Last Year: Never true  Transportation Needs: No Transportation Needs  . Lack of Transportation (Medical): No  . Lack of Transportation (Non-Medical): No  Physical Activity: Insufficiently Active  . Days of Exercise per Week: 6 days  . Minutes of Exercise per Session: 20 min  Stress:   . Feeling of Stress : Not on file  Social Connections:   . Frequency of Communication with Friends and Family: Not on file  . Frequency of Social Gatherings with Friends and Family: Not on file  . Attends Religious Services: Not on file  . Active Member of Clubs or  Organizations: Not on file  . Attends Archivist Meetings: Not on file  . Marital Status: Not on file  Intimate Partner Violence:   . Fear of Current or Ex-Partner: Not on file  . Emotionally Abused: Not on file  . Physically Abused: Not on file  . Sexually Abused: Not on file    Past Surgical History:  Procedure Laterality Date  . [place] STUDY N/A 03/03/2018   Procedure: [place] STUDY;  Surgeon: Mauri Pole, MD;  [facility];  Service: Endoscopy;  Laterality: N/A;  . COLONOSCOPY    . COLONOSCOPY W/ BIOPSIES AND POLYPECTOMY    . CORONARY ANGIOGRAPHY Right 04/21/2018   Procedure: CORONARY ANGIOGRAPHY (CATH LAB);  Surgeon: [name], MD;  Location: [place] [facility];  Service: Cardiovascular;  Laterality: Right;  . CORONARY STENT INTERVENTION N/A 04/22/2018   Procedure: CORONARY STENT INTERVENTION;  Surgeon: [name], MD;  Location: [place] [facility];  Service: Cardiovascular;  Laterality: N/A;  . DILATION AND CURETTAGE OF UTERUS    . ESOPHAGEAL MANOMETRY N/A 03/03/2018   Procedure: ESOPHAGEAL MANOMETRY (EM);  Surgeon: Mauri Pole, MD;  [facility];  Service: Endoscopy;  Laterality: N/A;  . GASTRIC FUNDOPLICATION    . HERNIA REPAIR    . HYSTEROSCOPY     fibroids  . LAPAROSCOPIC CHOLECYSTECTOMY    . LAPAROSCOPY     fibroids  . NISSEN FUNDOPLICATION  [id number]  . POLYPECTOMY    . RIGHT/LEFT HEART CATH AND CORONARY ANGIOGRAPHY N/A 02/20/2018   Procedure: RIGHT/LEFT HEART CATH AND CORONARY ANGIOGRAPHY;  Surgeon: [name], MD;  Location: [place] [facility];  Service: Cardiovascular;  Laterality: N/A;  . TEE WITHOUT CARDIOVERSION N/A 07/08/2018   Procedure: TRANSESOPHAGEAL ECHOCARDIOGRAM (TEE);  Surgeon: Burnell Blanks, MD;  Location: [place];  Service: Open Heart Surgery;  Laterality: N/A;  . TEE WITHOUT CARDIOVERSION  10/07/2018  . TEE WITHOUT CARDIOVERSION N/A 10/07/2018   Procedure: TRANSESOPHAGEAL  ECHOCARDIOGRAM (TEE);  Surgeon: Jerline Pain, MD;  Location: [facility] ENDOSCOPY;  Service: Cardiovascular;  Laterality: N/A;  . TONSILLECTOMY    . TRANSCATHETER AORTIC VALVE REPLACEMENT, TRANSFEMORAL N/A 07/08/2018   Procedure: TRANSCATHETER AORTIC VALVE REPLACEMENT, TRANSFEMORAL;  Surgeon: Burnell Blanks, MD;  Location: [place];  Service: Open Heart Surgery;  Laterality: N/A;    Family History  Adopted: Yes  Problem Relation Age of Onset  . Healthy Son        x 2  . Headache Other        Cluster headaches  . Heart failure Mother   . Colon cancer Neg Hx   . Pancreatic cancer Neg Hx   . Rectal cancer Neg Hx   . Stomach cancer Neg Hx     Allergies  Allergen Reactions  . Statins Other (See Comments)    Muscle aches    Current Outpatient Medications on File Prior to Visit  Medication Sig Dispense Refill  . amitriptyline (ELAVIL) 25 MG tablet TAKE 1 TABLET(25 MG) BY MOUTH AT BEDTIME 90 tablet 1  . BD INSULIN SYRINGE U/F 31G X 5/16" 1 ML MISC INJECT 4 TIMES DAILY SUBCUTANEOUSLY 100 each 3  . Cholecalciferol (VITAMIN D3 PO) Take 1 tablet by mouth daily.    Marland Kitchen diltiazem (CARDIZEM CD) 120 MG 24 hr capsule TAKE 1 CAPSULE(120 MG) BY MOUTH DAILY 90 capsule 1  . ELIQUIS 5 MG TABS tablet TAKE 1 TABLET BY MOUTH TWICE DAILY 180 tablet 1  . escitalopram (LEXAPRO) 20 MG tablet TAKE 1 TABLET(20 MG) BY MOUTH DAILY 90 tablet 1  . gabapentin (NEURONTIN) 300 MG capsule Take 1 capsule (300 mg total) by mouth 3 (three) times daily. 90 capsule 0  . Lancets (ONETOUCH DELICA PLUS [id number]) MISC USE TO CHECK BLOOD SUGAR TWICE DAILY.. 200 each 0  . losartan (COZAAR) 50 MG tablet TAKE 1 TABLET(50 MG) BY MOUTH DAILY 90 tablet 1  . metFORMIN (GLUCOPHAGE) 500 MG tablet Take 500 mg by mouth 2 (two) times daily with a meal.     . Multiple Vitamin (MULITIVITAMIN WITH MINERALS) TABS Take 1 tablet by mouth daily.    . nitroGLYCERIN (NITROSTAT) 0.4 MG SL tablet Place 0.4 mg under the tongue every 5 (five) minutes  as needed for chest pain. (Patient not taking: Reported on 10/29/2019)    . NOVOLIN N 100 UNIT/ML injection Inject 40 Units into the skin daily before breakfast.     . NOVOLIN R 100 UNIT/ML injection 10 UNITS THIRTY MINUTES BEFORE MEALS.  5 ADDITIONAL UNITS WITH CARBS OR SNACKS.    [name] VERIO test strip USE TO MONITOR GLUCOSE LEVELS TWICE DAILY 200 strip 3  . pantoprazole (PROTONIX) 40 MG tablet Take 1 tablet (40 mg total) by mouth daily. 90 tablet 3  . sucralfate (CARAFATE) 1 GM/10ML suspension Take 10 mLs (1 g total) by mouth 4 (four) times daily. (Patient not taking: Reported on 10/29/2019) 420 mL 1  . SYNTHROID 125 MCG tablet Take 125 mcg by mouth every morning.     No current facility-administered medications on file prior to visit.    BP 138/74   Temp 98.4 F (36.9 C) (Oral)   Wt 186 lb (84.4 kg)   BMI 30.95 kg/m       Objective:   Physical Exam Vitals and nursing note reviewed.  Constitutional:      Appearance: Normal appearance. She is ill-appearing.  Abdominal:     General: Abdomen is flat. Bowel sounds are normal.     Palpations: Abdomen is soft.     Tenderness: There is no right CVA tenderness or left CVA tenderness.  Musculoskeletal:        General: Normal range of motion.  Skin:    General: Skin is warm and dry.  Neurological:     General: No focal deficit present.     Mental Status: She is alert and oriented to person, place, and time.  Psychiatric:        Mood and Affect: Mood normal.        Behavior: Behavior normal.        Thought Content: Thought content normal.        Judgment: Judgment normal.       Assessment & Plan:   1. Acute cystitis with hematuria - POCT  urinalysis dipstick + leuks, nit. Blood  - Urine Culture - amoxicillin-clavulanate (AUGMENTIN) 875-125 MG tablet; Take 1 tablet by mouth 2 (two) times daily for 5 days.  Dispense: 10 tablet; Refill: 0 - Culture, Urine; Future   [employer]

## 2019-12-20 LAB — URINE CULTURE
MICRO NUMBER:: 10882011
SPECIMEN QUALITY:: ADEQUATE

## 2019-12-21 ENCOUNTER — Encounter: Payer: Self-pay | Admitting: Adult Health

## 2019-12-21 ENCOUNTER — Other Ambulatory Visit: Payer: Self-pay | Admitting: Gastroenterology

## 2019-12-21 ENCOUNTER — Ambulatory Visit (INDEPENDENT_AMBULATORY_CARE_PROVIDER_SITE_OTHER): Payer: Medicare Other | Admitting: Adult Health

## 2019-12-21 ENCOUNTER — Other Ambulatory Visit: Payer: Self-pay

## 2019-12-21 VITALS — BP 128/64 | HR 70 | Ht 65.0 in | Wt 183.0 lb

## 2019-12-21 DIAGNOSIS — I10 Essential (primary) hypertension: Secondary | ICD-10-CM

## 2019-12-21 DIAGNOSIS — R41841 Cognitive communication deficit: Secondary | ICD-10-CM | POA: Diagnosis not present

## 2019-12-21 DIAGNOSIS — R269 Unspecified abnormalities of gait and mobility: Secondary | ICD-10-CM | POA: Diagnosis not present

## 2019-12-21 DIAGNOSIS — I69398 Other sequelae of cerebral infarction: Secondary | ICD-10-CM

## 2019-12-21 DIAGNOSIS — Z8673 Personal history of transient ischemic attack (TIA), and cerebral infarction without residual deficits: Secondary | ICD-10-CM | POA: Diagnosis not present

## 2019-12-21 DIAGNOSIS — E0865 Diabetes mellitus due to underlying condition with hyperglycemia: Secondary | ICD-10-CM | POA: Diagnosis not present

## 2019-12-21 DIAGNOSIS — I480 Paroxysmal atrial fibrillation: Secondary | ICD-10-CM | POA: Diagnosis not present

## 2019-12-21 NOTE — Progress Notes (Signed)
[facility] [address] [place]. [place] [address] [phone]       OFFICE FOLLOW UP NOTE  Breanna Webster Date of Birth:  1946/04/19 Medical Record Number:  [id number]   Reason for Referral: stroke follow up    CHIEF COMPLAINT:  Chief Complaint  Patient presents with  . Follow-up    rm 5  . Cerebrovascular Accident    Pt said her sx are not worse but the same. Pt is here with her husband [name]. He said her short term memory is off.    HPI:  Today, 12/21/2019, [name] returns for stroke follow-up.  She is accompanied by her husband.  Residual deficits of left hand tremor, unsteadiness and short-term memory impairment.  She will have occasional dizziness but typically upon standing from seated position.  Denies any reoccurring diplopia Prior concern of worsening diplopia, imbalance, dizziness and left hand shaking therefore obtain MRI which was negative for acute abnormality She was referred to PT at prior visit but for unknown reason, they were not called to schedule initial evaluation.  She is interested in pursuing PT Denies new stroke/TIA symptoms  Remains on Eliquis 5 mg twice daily without bleeding or bruising for secondary stroke prevention and history of atrial fibrillation Blood pressure today 128/64 Glucose levels stable per patient HTN, HLD and DM currently managed by PCP  She has multiple other complaints today: She also reports excessive daytime fatigue, insomnia, occasional nocturia and snoring Underwent sleep study approximately 1 year ago which showed mild sleep apnea with AHI 5.0.  Does report worsening daytime fatigue and insomnia and she continues to question accuracy of sleep study results She is currently being evaluated by neurosurgery for right underarm and chest numbness/tingling with concerns of thoracic involvement.  Plans on obtaining MRI cervical and thoracic scheduled on 01/15/2020  No further concerns at this  time     History provided for reference purposes only Update 09/14/2019 JM: Breanna Webster is a 74 year old female with underlying medical history of DVT history, aortic stenosis s/p TAVR 06/2018, hypothyroidism, pulmonary hypertension, HTN, DM, CAD s/p stenting 03/2018, hx of infective endocarditis 09/2018 and multifocal bilateral anterior posterior infarcts secondary to new onset A. fib in 09/2018.  Patient requests appointment today with concern for intermittent double vision, imbalance, dizziness and left hand shaking.  She is accompanied by her husband.  Previously seen roughly 6 months ago stable from a stroke standpoint and advised to follow-up as needed.  Prior complaints of intermittent diplopia, left hemiparesis and imbalance secondary to multiple cardioembolic strokes throughout the supratentorial brain, brainstem and cerebellum including multiple lesions within the corpus callosum.  She believes she continued to have intermittent symptoms but believes over the past few months symptoms have worsened or become more frequent.  she did have ophthalmology evaluation on 09/07/2019 with concern of bilateral diplopia worsened with visual tasks with saccadic changes.  Experiences worsening dizziness or diplopia while watching TV for too long.  Also experience dizziness if standing too quickly or rapid head movements.  Symptoms only last for 10 to 20 seconds and then resolve.  Episodes occur 1-2 times per week.  Denies headache, migraine, worsening weakness, numbness/tingling, speech impairment or confusion.  She endorses ongoing compliance with all prescribed medications including Eliquis 5 mg twice daily.  Blood pressure monitored at home which has been stable with today's reading 125/75.  Underlying mild cognitive impairment post stroke which has been stable without worsening.  She questions clearance for driving.  Continues  to follow with PCP for HTN, HLD and DM management.  No further concerns at this  time.  Update 03/12/2019 JM: Breanna Webster is a 74 year old female who is being seen today for stroke follow-up accompanied by her husband.  She continues to experience balance difficulties, mild left hemiparesis and mild cognitive impairment without much improvement.  Her greatest concern today is ongoing shortness of breath which has been present since the beginning of this year.  Her shortness of breath severely limits her daily functioning and activity.  Recently seen by Dr. Tamala Julian, her cardiologist, who felt continued shortness of breath was multifactorial but highly encouraged increasing activity and possibly cardiac rehab.  She feels as though "something is being missed" and is overly frustrated.  She also continues to experience excessive daytime fatigue and insomnia at night.  Prior sleep study negative for OSA.  She continues on Eliquis and aspirin without bleeding or bruising.  Blood pressure today satisfactory at 128/78.  Continues on home Lexapro 20 mg daily and feels as though depression and anxiety stable.  She also continues on amitriptyline 25 mg nightly for prior concerns with headache.  She denies any reoccurring headaches at this time.  No further concerns at this time.  Initial visit 12/10/2018 JM: Residual deficits of mild left hemiparesis, mild left facial droop with occasional speech difficulty, balance difficulties, decreased vision and short-term memory concerns.  She does have difficulty ambulating at times stating her legs feel "rubbery" typically worse worsening in the morning and in the evening.  She is able to ambulate without assistive device.  Prior complaints of left-sided vision with sensation of a shade pulled over her left eye but this is since resolved and recently obtained new prescription glasses by ophthalmology.  She also endorses short-term memory concerns such as forgetting recent question/answer or her husband having to continuously repeat himself.  She denies  improvement but her husband states he has seen improvement of her overall ambulation and memory concerns.  She has not received any therapy as this was not recommended at discharge.  She is questioning possible participation in therapy. Previously experiencing left-sided severe headache with recent cessation with continued use of  amitriptyline 25 mg nightly and gabapentin 300 mg nightly.  It was recommended to discontinue gabapentin 300 mg nightly by PCP as she did not gain benefit but she is not aware of this and has continued.  Symptoms previously located left occipital radiating to frontal region with short duration sharp stabbing radiating pain which appears to be secondary to possible occipital neuralgia. Previously underwent sleep study for possible OSA which was negative Continues on Eliquis and aspirin 81 mg without bleeding or bruising Continues to follow with PCP for DM management Blood pressure today 107/60 States depression/anxiety well controlled with continuation of Lexapro No further concerns at this time  Stroke admission 10/21/2018: Ms. JOHONNA BINETTE is a 74 y.o. female with history of DVT, aortic stenosis s/p TAVR in March 2020, hypothyroidism, pulmonary hypertension, HTN, CADs/p stenting December 2019 recently admitted for infective endocarditis on the mitral valve treated with 2 weeks gentamicin and ceftriaxone via PEG who presented on 10/21/2018 to the hospital with increased shortness of breath and confusion x2 weeks along with headache.  No focal neurologic symptoms.  New A. Fib with RVR found in the ED and started on heparin drip.  Neurology consulted with work-up revealing multifocal bilateral anterior and posterior infarcts cardioembolic pattern as evidenced on MRI secondary to new onset A. fib versus recent endocarditis.  MRI showed diffuse scattered subcentimeter foci of reduced diffusion and intermediate diffusion throughout the supratentorial brain, brainstem and cerebellum  including multiple lesions within the corpus callosum.  Vessel imaging unremarkable with only mild arthrosclerotic bilateral ICA bifurcations.  2D echo normal EF without mention of vegetation.  Recommended initiating D [place] with new onset of A. fib.  HTN stable.  LDL 85 and due to history of statin allergy with myalgias, statin initiation was not recommended.  A1c 7.5 and recommended follow-up with PCP for uncontrolled DM.  Other stroke risk factors include advanced age, obesity, CAD, MVR and status post TAVR 06/2018.  She was discharged home in stable condition without therapy.  She returned to ED on 11/17/2018 with complaints of sudden onset left-sided headache located left parietal occipital area at the base of her neck.  CTA unremarkable for dissection or other abnormality.  She did receive 2 migraine cocktails with improvement of headache symptoms.  She has since followed up with her PCP who initiated gabapentin along with continuation of nortriptyline 50 mg nightly.  She apparently did not gain benefit from gabapentin therefore recommended discontinuing gabapentin and nortriptyline by PCP and initiated on amitriptyline.  She did have follow-up with ophthalmologist with concerns of occipital neuralgia.        ROS:   14 system review of systems performed and negative with exception of see HPI  PMH:  Past Medical History:  Diagnosis Date  . Anxiety   . Arthritis    "back" (04/22/2018)  . Back pain   . Blood transfusion without reported diagnosis   . CAD (coronary artery disease)    a. 03/2018 s/p PCI/DES to the RCA (3.0x15 Onyx DES).  . Carotid artery stenosis    Mild  . Chest pain   . Chronic lower back pain   . Cirrhosis ([place])   . Colon polyps   . Diverticulitis   . Diverticulosis   . Esophageal thickening    seen on pre TAVR CT scan, also questionable cirrhosis. MRI recommended. Will refer to GI  . Fatty liver   . GERD (gastroesophageal reflux disease)   . Grave's disease   .  History of colonic polyps 05/22/2017  . History of hiatal hernia   . Hypertension   . Hypothyroidism   . IBS (irritable bowel syndrome)   . Osteopenia   . Pulmonary nodules    seen on pre TAVR CT. likley benign. no follow up recommended if pt low risk.  . S/P TAVR (transcatheter aortic valve replacement)   . Severe aortic stenosis   . Shortness of breath on exertion   . Stroke ([place])   . Thalassemia minor   . Thyroid disease   . Type II diabetes mellitus (HCC)     PSH:  Past Surgical History:  Procedure Laterality Date  . [place] STUDY N/A 03/03/2018   Procedure: [place] STUDY;  Surgeon: Mauri Pole, MD;  Location: [facility];  Service: Endoscopy;  Laterality: N/A;  . COLONOSCOPY    . COLONOSCOPY W/ BIOPSIES AND POLYPECTOMY    . CORONARY ANGIOGRAPHY Right 04/21/2018   Procedure: CORONARY ANGIOGRAPHY (CATH LAB);  Surgeon: [name], MD;  Location: [facility];  Service: Cardiovascular;  Laterality: Right;  . CORONARY STENT INTERVENTION N/A 04/22/2018   Procedure: CORONARY STENT INTERVENTION;  Surgeon: [name], MD;  Location: [facility] LAB;  Service: Cardiovascular;  Laterality: N/A;  . DILATION AND CURETTAGE OF UTERUS    . ESOPHAGEAL  MANOMETRY N/A 03/03/2018   Procedure: ESOPHAGEAL MANOMETRY (EM);  Surgeon: Mauri Pole, MD;  Location: [facility];  Service: Endoscopy;  Laterality: N/A;  . GASTRIC FUNDOPLICATION    . HERNIA REPAIR    . HYSTEROSCOPY     fibroids  . LAPAROSCOPIC CHOLECYSTECTOMY    . LAPAROSCOPY     fibroids  . NISSEN FUNDOPLICATION  8338S  . POLYPECTOMY    . RIGHT/LEFT HEART CATH AND CORONARY ANGIOGRAPHY N/A 02/20/2018   Procedure: RIGHT/LEFT HEART CATH AND CORONARY ANGIOGRAPHY;  Surgeon: [name], MD;  Location: [facility];  Service: Cardiovascular;  Laterality: N/A;  . TEE WITHOUT CARDIOVERSION N/A 07/08/2018   Procedure: TRANSESOPHAGEAL ECHOCARDIOGRAM (TEE);  Surgeon: Burnell Blanks, MD;   Location: [facility];  Service: Open Heart Surgery;  Laterality: N/A;  . TEE WITHOUT CARDIOVERSION  10/07/2018  . TEE WITHOUT CARDIOVERSION N/A 10/07/2018   Procedure: TRANSESOPHAGEAL ECHOCARDIOGRAM (TEE);  Surgeon: Jerline Pain, MD;  Location: [facility] END[facility];  Service: Cardiovascular;  Laterality: N/A;  . TONSILLECTOMY    . TRANSCATHETER AORTIC VALVE REPLACEMENT, TRANSFEMORAL N/A 07/08/2018   Procedure: TRANSCATHETER AORTIC VALVE REPLACEMENT, TRANSFEMORAL;  Surgeon: Burnell Blanks, MD;  Location: [facility];  Service: Open Heart Surgery;  Laterality: N/A;    Social History:  Social History   Socioeconomic History  . Marital status: Married    Spouse name: Not on file  . Number of children: 2  . Years of education: Not on file  . Highest education level: Not on file  Occupational History  . Occupation: Tree surgeon of the [employer]  Tobacco Use  . Smoking status: Never Smoker  . Smokeless tobacco: Never Used  Vaping Use  . Vaping Use: Never used  Substance and Sexual Activity  . Alcohol use: No    Alcohol/week: 0.0 standard drinks  . Drug use: Never  . Sexual activity: Not Currently  Other Topics Concern  . Not on file  Social History Narrative   Lives with husband in a one story home.     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   Education: college.   Social Determinants of Health   Financial Resource Strain: Low Risk   . Difficulty of Paying Living Expenses: Not very hard  Food Insecurity: No Food Insecurity  . Worried About Charity fundraiser in the Last Year: Never true  . Ran Out of Food in the Last Year: Never true  Transportation Needs: No Transportation Needs  . Lack of Transportation (Medical): No  . Lack of Transportation (Non-Medical): No  Physical Activity: Insufficiently Active  . Days of Exercise per Week: 6 days  . Minutes of Exercise per Session: 20 min  Stress:   .  Feeling of Stress : Not on file  Social Connections:   . Frequency of Communication with Friends and Family: Not on file  . Frequency of Social Gatherings with Friends and Family: Not on file  . Attends Religious Services: Not on file  . Active Member of Clubs or Organizations: Not on file  . Attends Archivist Meetings: Not on file  . Marital Status: Not on file  Intimate Partner Violence:   . Fear of Current or Ex-Partner: Not on file  . Emotionally Abused: Not on file  . Physically Abused: Not on file  . Sexually Abused: Not on file    Family History:  Family History  Adopted: Yes  Problem Relation Age of Onset  . Healthy Son        x 2  . Headache Other        Cluster headaches  . Heart failure Mother   . Colon cancer Neg Hx   . Pancreatic cancer Neg Hx   . Rectal cancer Neg Hx   . Stomach cancer Neg Hx     Medications:   Current Outpatient Medications on File Prior to Visit  Medication Sig Dispense Refill  . amitriptyline (ELAVIL) 25 MG tablet TAKE 1 TABLET(25 MG) BY MOUTH AT BEDTIME 90 tablet 1  . amoxicillin-clavulanate (AUGMENTIN) 875-125 MG tablet Take 1 tablet by mouth 2 (two) times daily for 5 days. 10 tablet 0  . BD INSULIN SYRINGE U/F 31G X 5/16" 1 ML MISC INJECT 4 TIMES DAILY SUBCUTANEOUSLY 100 each 3  . Cholecalciferol (VITAMIN D3 PO) Take 1 tablet by mouth daily.    Marland Kitchen diltiazem (CARDIZEM CD) 120 MG 24 hr capsule TAKE 1 CAPSULE(120 MG) BY MOUTH DAILY 90 capsule 1  . ELIQUIS 5 MG TABS tablet TAKE 1 TABLET BY MOUTH TWICE DAILY 180 tablet 1  . escitalopram (LEXAPRO) 20 MG tablet TAKE 1 TABLET(20 MG) BY MOUTH DAILY 90 tablet 1  . Lancets (ONETOUCH DELICA PLUS [id number]) MISC USE TO CHECK BLOOD SUGAR TWICE DAILY.. 200 each 0  . metFORMIN (GLUCOPHAGE) 500 MG tablet Take 500 mg by mouth 2 (two) times daily with a meal.     . Multiple Vitamin (MULITIVITAMIN WITH MINERALS) TABS Take 1 tablet by mouth daily.    . nitroGLYCERIN (NITROSTAT) 0.4 MG SL tablet  Place 0.4 mg under the tongue every 5 (five) minutes as needed for chest pain.     Marland Kitchen NOVOLIN N 100 UNIT/ML injection Inject 40 Units into the skin daily before breakfast.     . NOVOLIN R 100 UNIT/ML injection 10 UNITS THIRTY MINUTES BEFORE MEALS.  5 ADDITIONAL UNITS WITH CARBS OR SNACKS.    [name] VERIO test strip USE TO MONITOR GLUCOSE LEVELS TWICE DAILY 200 strip 3  . sucralfate (CARAFATE) 1 GM/10ML suspension Take 10 mLs (1 g total) by mouth 4 (four) times daily. 420 mL 1  . SYNTHROID 125 MCG tablet Take 125 mcg by mouth every morning.     No current facility-administered medications on file prior to visit.    Allergies:   Allergies  Allergen Reactions  . Statins Other (See Comments)    Muscle aches     Physical Exam  Vitals:   12/21/19 1013  BP: 128/64  Pulse: 70  Weight: 183 lb (83 kg)  Height: 5' 5"  (1.651 m)   Body mass index is 30.45 kg/m. No exam data present   General: well developed, well nourished, pleasant elderly Caucasian female, seated, in no evident distress Head: head normocephalic and atraumatic.   Neck: supple with no carotid or supraclavicular bruits Cardiovascular: regular rate and rhythm, no murmurs Musculoskeletal: no deformity Skin:  no rash/petichiae Vascular:  Normal pulses all extremities   Neurologic Exam Mental Status: Awake and fully alert. Fluent speech and language. Oriented to place and time. Recent memory mildly impaired and remote memory intact.  Attention span, concentration and fund of knowledge mostly appropriate with occasional difficulty such as repeating questions. Mood and affect appropriate.  MMSE - Mini Mental State Exam 12/21/2019 07/31/2017 07/31/2017  Not completed: - (No Data) (No Data)  Orientation to time 5 - -  Orientation to Place 5 - -  Registration 3 - -  Attention/ Calculation 1 - -  Recall 1 - -  Language- name 2 objects 2 - -  Language- repeat 1 - -  Language- follow 3 step command 3 - -  Language- read &  follow direction 1 - -  Write a sentence 1 - -  Copy design 1 - -  Total score 24 - -   Cranial Nerves: Pupils equal, briskly reactive to light. Extraocular movements full without nystagmus.  Visual fields full to confrontation. Hearing intact. Facial sensation intact.  Mild left lower facial weakness Motor: Normal bulk and tone.  Full strength in all tested extremities except slight decreased left hand dexterity Sensory.: intact to touch , pinprick , position and vibratory sensation.  Coordination: Rapid alternating movements normal in all extremities except slightly decreased left hand. Finger-to-nose showed and heel-to-shin performed accurately bilaterally.  Occasional left hand tremor but not consistently with rest or action.  Negative for cogwheel rigidity or bradykinesia. Gait and Station: Arises from chair with mild difficulty. Stance is normal. Gait demonstrates  normal stride length with mild imbalance greater with turns.  No assistive device.  Able to heel toe with mild difficulty.  Reflexes: 1+ and symmetric. Toes downgoing.       ASSESSMENT: Breanna Webster is a 74 y.o. year old female presented with increased shortness of breath and confusion x2 weeks along with headache on 10/21/2018 with recent admission on 10/03/2018 for sepsis and mitral valve endocarditis.  Stroke work-up revealed multifocal bilateral anterior and posterior infarcts cardioembolic pattern secondary to new onset A. fib versus recent endocarditis. Vascular risk factors include history of DVT, aortic stenosis status post TAVR 06/2018, pulmonary hypertension, HTN, DM, CAD status post stenting 03/2018, recent endocarditis and new onset A. fib.     PLAN:  1. Hx multiple Cardiologic strokes including cerebellar and pontine stroke:  a. Residual deficits: Gait impairment, unsteadiness, mild LUE decreased dexterity and intermittent tremor and cognitive impairment.  i. Referral will be placed to PT. Will send to  [facility] per husband request ii. Discussion regarding intermittent tremor likely from prior stroke.  Symptoms do not interfere with daily activity or functioning therefore no indication to trial medications especially when risk outweighs benefit.  Discussed use of weighted wristband for possible benefit. iii. MMSE today 24/30 -became anxious during testing b. Continue Eliquis (apixaban) daily  for secondary stroke prevention.   c. Maintain strict control of hypertension with blood pressure goal below 130/90, diabetes with hemoglobin A1c goal below 6.5% and cholesterol with LDL cholesterol (bad cholesterol) goal below 70 mg/dL.  I also advised the patient to eat a healthy diet with plenty of whole grains, cereals, fruits and vegetables, exercise regularly with at least 30 minutes of continuous activity daily and maintain ideal body weight. 2. Atrial fibrillation: continue eliquis and follow up with cardiology  3. HTN: Stable at today's visit.  Continue to follow with PCP for monitoring and management 4. DMII: Continue to follow PCP for monitoring management 5. Excessive daytime fatigue: Underwent sleep study approximately 1 year ago which showed mild sleep apnea with AHI 5.0.  She does report worsening daytime fatigue but also has had weight loss since that time. Will further look into if she is eligible for repeat sleep study.  Fatigue has been extensively evaluated by PCP and even oncology.  Concern for underlying psychogenic causes as well as multiple prior strokes.  May benefit from psychology evaluation -deferred to PCP.   Follow-up in 6 months or call earlier if needed  I spent 40 minutes of face-to-face and non-face-to-face time with patient and husband.  This included previsit chart review, lab review, study review, order entry, electronic health record documentation, patient education regarding prior history of stroke, residual deficits, importance of managing stroke risk factors,  excessive daytime fatigue, and answered all other multiple questions to patient and husband satisfaction    [name], AGNP-BC  [facility] [address] [place], [place] [address]  Phone [phone] Fax [phone] Note: This document was prepared with digital dictation and possible smart phrase technology. Any transcriptional errors that result from this process are unintentional.

## 2019-12-21 NOTE — Patient Instructions (Signed)
Referral will be sent to PT for unsteadiness and imbalance  Your left hand tremor is likely due to prior stroke.  Recommend trialing wrist weight for possible improvement  I will look further into your prior sleep study and possibly repeat study  Continue Eliquis (apixaban) daily for secondary stroke prevention  Continue to follow with cardiology for atrial fibrillation monitoring and management  Continue to follow up with PCP regarding blood pressure management  Maintain strict control of hypertension with blood pressure goal below 130/90, diabetes with hemoglobin A1c goal below 6.5% and cholesterol with LDL cholesterol (bad cholesterol) goal below 70 mg/dL.       Followup in the future with me in 6 months or call earlier if needed       Thank you for coming to see [name] at [facility]. I hope we have been able to provide you high quality care today.  You may receive a patient satisfaction survey over the next few weeks. We would appreciate your feedback and comments so that we may continue to improve ourselves and the health of our patients.

## 2019-12-22 NOTE — Progress Notes (Signed)
I agree with the above plan 

## 2019-12-23 ENCOUNTER — Ambulatory Visit (INDEPENDENT_AMBULATORY_CARE_PROVIDER_SITE_OTHER): Payer: Medicare Other | Admitting: Family Medicine

## 2019-12-24 ENCOUNTER — Encounter: Payer: Self-pay | Admitting: Adult Health

## 2019-12-25 ENCOUNTER — Other Ambulatory Visit: Payer: Self-pay | Admitting: Adult Health

## 2019-12-25 MED ORDER — AMOXICILLIN-POT CLAVULANATE 875-125 MG PO TABS: 1.0000 | ORAL_TABLET | Freq: Two times a day (BID) | ORAL | 0 refills | Status: DC

## 2019-12-29 ENCOUNTER — Telehealth: Payer: Self-pay | Admitting: Adult Health

## 2019-12-29 NOTE — Telephone Encounter (Signed)
Patient husband called stating that Neuro therapy is only done at [facility] but he states that is 30 miles from there house and he can not drive 60 miles a day. He was wondering if he could go somewhere in [place] closer to home. His phone number is [phone].

## 2019-12-30 NOTE — Telephone Encounter (Signed)
Breanna Webster and her stated they cant travel 30 minutes for Neuron Therapy . Webster states any other ideas in [place] ? Can they do regular PT will this help they need something close to there home please advise ?

## 2019-12-30 NOTE — Telephone Encounter (Signed)
Yes I Understand but I think she thought they did in [place] . But they don't she didn't realize  it .

## 2019-12-30 NOTE — Telephone Encounter (Signed)
I referred her to [place] per his request.  I am not sure why a referral was sent to [facility]?

## 2019-12-31 NOTE — Telephone Encounter (Signed)
I advised the husband that they likely do not offer neuro rehab at that facility but as that was closer to their home, he still wished to pursue going there.  If they would like to do neuro rehab, I can refer to our neuro rehab clinic

## 2020-01-03 ENCOUNTER — Encounter: Payer: Self-pay | Admitting: Adult Health

## 2020-01-04 NOTE — Telephone Encounter (Signed)
Called patient and left her a message asking her to call me back .

## 2020-01-06 DIAGNOSIS — Z23 Encounter for immunization: Secondary | ICD-10-CM | POA: Diagnosis not present

## 2020-01-06 NOTE — Telephone Encounter (Signed)
Patient called back and stated she wanted to go back to [facility] PT order has been [employer] [phone] fax [phone] . Called and left her a message stating I had sent the referral.

## 2020-01-06 NOTE — Telephone Encounter (Signed)
Called patient and left her another message stating I would send her order to Neuro - rehab and she could call and sch. At [phone]. Breanna Webster is fine with this .

## 2020-01-07 ENCOUNTER — Encounter: Payer: Self-pay | Admitting: Adult Health

## 2020-01-07 MED ORDER — SULFAMETHOXAZOLE-TRIMETHOPRIM 800-160 MG PO TABS
1.0000 | ORAL_TABLET | Freq: Two times a day (BID) | ORAL | 0 refills | Status: DC
Start: 2020-01-07 — End: 2020-01-25

## 2020-01-07 NOTE — Telephone Encounter (Signed)
Spoke to patient and Rx sent.

## 2020-01-11 ENCOUNTER — Telehealth: Payer: Self-pay | Admitting: Adult Health

## 2020-01-11 NOTE — Telephone Encounter (Signed)
Pt called stating that she is needing something called in for her to keep calm for her MRI on the 24th. She would like it called in to the [facility] on [address]. Please advise.

## 2020-01-11 NOTE — Telephone Encounter (Addendum)
We did not order these MRI's.  I called pt LMVM for her that we did not order looks like vincent costella PA did at [facility].

## 2020-01-15 ENCOUNTER — Ambulatory Visit
Admission: RE | Admit: 2020-01-15 | Discharge: 2020-01-15 | Disposition: A | Discharge status: Home / Self Care | Payer: Medicare Other | Source: Ambulatory Visit | Attending: Physician Assistant | Admitting: Physician Assistant

## 2020-01-15 DIAGNOSIS — Q7649 Other congenital malformations of spine, not associated with scoliosis: Secondary | ICD-10-CM | POA: Diagnosis not present

## 2020-01-15 DIAGNOSIS — M4802 Spinal stenosis, cervical region: Secondary | ICD-10-CM | POA: Diagnosis not present

## 2020-01-15 DIAGNOSIS — D1809 Hemangioma of other sites: Secondary | ICD-10-CM | POA: Diagnosis not present

## 2020-01-15 DIAGNOSIS — M79621 Pain in right upper arm: Secondary | ICD-10-CM

## 2020-01-15 DIAGNOSIS — M47814 Spondylosis without myelopathy or radiculopathy, thoracic region: Secondary | ICD-10-CM | POA: Diagnosis not present

## 2020-01-15 DIAGNOSIS — M50323 Other cervical disc degeneration at C6-C7 level: Secondary | ICD-10-CM | POA: Diagnosis not present

## 2020-01-15 DIAGNOSIS — M4312 Spondylolisthesis, cervical region: Secondary | ICD-10-CM | POA: Diagnosis not present

## 2020-01-15 DIAGNOSIS — Q279 Congenital malformation of peripheral vascular system, unspecified: Secondary | ICD-10-CM | POA: Diagnosis not present

## 2020-01-18 ENCOUNTER — Encounter: Payer: Self-pay | Admitting: Adult Health

## 2020-01-20 NOTE — Progress Notes (Signed)
Virtual Visit via Telephone Note   This visit type was conducted due to national recommendations for restrictions regarding the COVID-19 Pandemic (e.g. social distancing) in an effort to limit this patient's exposure and mitigate transmission in our community.  Due to her co-morbid illnesses, this patient is at least at moderate risk for complications without adequate follow up.  This format is felt to be most appropriate for this patient at this time.  The patient did not have access to video technology/had technical difficulties with video requiring transitioning to audio format only (telephone).  All issues noted in this document were discussed and addressed.  No physical exam could be performed with this format.  Please refer to the patient's chart for her  consent to telehealth for [facility].    Date:  01/25/2020   ID:  Breanna Webster, DOB 1945/05/14, MRN [id number] The patient was identified using 2 identifiers.  Patient Location: Home Provider Location: Office/Clinic  PCP:  [name], NP  Cardiologist:  Sinclair Grooms, MD  Electrophysiologist:  None   Evaluation Performed:  Follow-Up Visit  Chief Complaint:  6 month follow up  History of Present Illness:    Breanna Webster is a 74 y.o. female with a history of DM2, Graves' disease, aortic stenosis s/p TAVR in 06/2018, hypothyroidism, hypertension, pulmonary hypertension and CAD with PCI/DES x1 to the [facility] in 03/2018. Also with a history of mitral valve endocarditis 09/2018, PAF and cerebellar infarction 10/2018.  Her CAD history dates back to 03/2018 with PCI. She has mitral valve prolapse without significant mitral regurgitation. She underwent successful TAVR 07/08/2018 with postoperative echocardiogram showing normally functioning TAVR. Unfortunately within several months of her procedure she developed bacteremia/endocarditis. She was treated with IV antibiotics for 6 weeks June and July 2020. During that  hospitalization, she had new onset atrial fibrillation with bilateral cerebellar strokes. At that time, she was started on anticoagulation with Eliquis. She has had ongoing dyspnea since TAVR and has been referred to Dr. Aline Brochure at [facility] for second opinion. He felt her ongoing dyspnea was secondary to severe impairment of diastolic inflow into the left heart in the setting of severe LVH and calcific mitral valve disease. He recommended adding low-dose Lasix and spironolactone.  She was most recently seen by [name], PA for TAVR follow-up. At that time, she had stopped taking HCTZ due to frequent urination. She continued to have dyspnea and fatigue. Her ASA was stopped and SBE amoxicillin was prescribed.  She was then seen for second opinion regarding dyspnea with Dr. Aline Brochure at [facility] who reported dyspnea is due to severe impairment of diastolic inflow into the LV in the setting of a severe LVH and calcific mitral valve disease who also felt cirrhosis and anemia were causing high output contributing to her symptoms as well.  Plan was to optimize medications including combination of low-dose Lasix and spironolactone when necessary.  If she could tolerate a beta-blocker, could be more beneficial than diltiazem to regulate HR.  Plan was also for lifestyle modifications including weight loss.  No medication changes were made with plans to see him back as needed.    Today she states she is doing ok from a CV standpoint. She reports that her dyspnea has improved. She states that she biggest complaint is the residual right sided weakness from her stroke which has not worsened however is not getting any better. She starts another round of PT next week. She says that she is normally a  very active, fast moving person so having to slow down is hard for her. She denies chest pain, palpitations, LE edema, orthopnea, dizziness or syncope. We discussed adding low dose Lasix or spiro to her regimen as recommended by Dr.  Aline Brochure however given no updated BP and reports of low normal BPs at baseline and no evidence of volume over load on exam, will hold off for now until she is seen in person. Pt agrees with plan.   The patient does not have symptoms concerning for COVID-19 infection (fever, chills, cough, or new shortness of breath).    Past Medical History:  Diagnosis Date   Anxiety    Arthritis    "back" (04/22/2018)   Back pain    Blood transfusion without reported diagnosis    CAD (coronary artery disease)    a. 03/2018 s/p PCI/DES to the RCA (3.0x15 Onyx DES).   Carotid artery stenosis    Mild   Chest pain    Chronic lower back pain    Cirrhosis (HCC)    Colon polyps    Diverticulitis    Diverticulosis    Esophageal thickening    seen on pre TAVR CT scan, also questionable cirrhosis. MRI recommended. Will refer to GI   Fatty liver    GERD (gastroesophageal reflux disease)    Grave's disease    History of colonic polyps 05/22/2017   History of hiatal hernia    Hypertension    Hypothyroidism    IBS (irritable bowel syndrome)    Osteopenia    Pulmonary nodules    seen on pre TAVR CT. likley benign. no follow up recommended if pt low risk.   S/P TAVR (transcatheter aortic valve replacement)    Severe aortic stenosis    Shortness of breath on exertion    Stroke ([place])    Thalassemia minor    Thyroid disease    Type II diabetes mellitus ([place])    Past Surgical History:  Procedure Laterality Date   40 HOUR [place] STUDY N/A 03/03/2018   Procedure: 24 HOUR PH STUDY;  Surgeon: Mauri Pole, MD;  Location: [facility];  Service: Endoscopy;  Laterality: N/A;   COLONOSCOPY     COLONOSCOPY W/ BIOPSIES AND POLYPECTOMY     CORONARY ANGIOGRAPHY Right 04/21/2018   Procedure: CORONARY ANGIOGRAPHY (CATH LAB);  Surgeon: [name], MD;  Location: [place] CV LAB;  Service: Cardiovascular;  Laterality: Right;   CORONARY STENT INTERVENTION N/A 04/22/2018    Procedure: CORONARY STENT INTERVENTION;  Surgeon: [name], MD;  Location: [place] CV LAB;  Service: Cardiovascular;  Laterality: N/A;   DILATION AND CURETTAGE OF UTERUS     ESOPHAGEAL MANOMETRY N/A 03/03/2018   Procedure: ESOPHAGEAL MANOMETRY (EM);  Surgeon: Mauri Pole, MD;  Location: [facility];  Service: Endoscopy;  Laterality: N/A;   GASTRIC FUNDOPLICATION     HERNIA REPAIR     HYSTEROSCOPY     fibroids   LAPAROSCOPIC CHOLECYSTECTOMY     LAPAROSCOPY     fibroids   NISSEN FUNDOPLICATION  9150V   POLYPECTOMY     RIGHT/LEFT HEART CATH AND CORONARY ANGIOGRAPHY N/A 02/20/2018   Procedure: RIGHT/LEFT HEART CATH AND CORONARY ANGIOGRAPHY;  Surgeon: [name], MD;  Location: [facility];  Service: Cardiovascular;  Laterality: N/A;   TEE WITHOUT CARDIOVERSION N/A 07/08/2018   Procedure: TRANSESOPHAGEAL ECHOCARDIOGRAM (TEE);  Surgeon: Burnell Blanks, MD;  Location: [place];  Service: Open Heart Surgery;  Laterality: N/A;  TEE WITHOUT CARDIOVERSION  10/07/2018   TEE WITHOUT CARDIOVERSION N/A 10/07/2018   Procedure: TRANSESOPHAGEAL ECHOCARDIOGRAM (TEE);  Surgeon: Jerline Pain, MD;  Location: [facility] END[facility];  Service: Cardiovascular;  Laterality: N/A;   TONSILLECTOMY     TRANSCATHETER AORTIC VALVE REPLACEMENT, TRANSFEMORAL N/A 07/08/2018   Procedure: TRANSCATHETER AORTIC VALVE REPLACEMENT, TRANSFEMORAL;  Surgeon: Burnell Blanks, MD;  Location: [place];  Service: Open Heart Surgery;  Laterality: N/A;     Current Meds  Medication Sig   amitriptyline (ELAVIL) 25 MG tablet TAKE 1 TABLET(25 MG) BY MOUTH AT BEDTIME   BD INSULIN SYRINGE U/F 31G X 5/16" 1 ML MISC INJECT 4 TIMES DAILY SUBCUTANEOUSLY   Cholecalciferol (VITAMIN D3 PO) Take 1 tablet by mouth daily.   diltiazem (CARDIZEM CD) 120 MG 24 hr capsule TAKE 1 CAPSULE(120 MG) BY MOUTH DAILY   ELIQUIS 5 MG TABS tablet TAKE 1 TABLET BY MOUTH TWICE DAILY   Lancets (ONETOUCH DELICA PLUS  [id number]) MISC USE TO CHECK BLOOD SUGAR TWICE DAILY.Marland Kitchen   losartan (COZAAR) 50 MG tablet Take 50 mg by mouth daily.   metFORMIN (GLUCOPHAGE) 500 MG tablet Take 500 mg by mouth 2 (two) times daily with a meal.    Multiple Vitamin (MULITIVITAMIN WITH MINERALS) TABS Take 1 tablet by mouth daily.   nitroGLYCERIN (NITROSTAT) 0.4 MG SL tablet Place 0.4 mg under the tongue every 5 (five) minutes as needed for chest pain.    NOVOLIN N 100 UNIT/ML injection Inject 40 Units into the skin daily before breakfast.    NOVOLIN R 100 UNIT/ML injection 10 UNITS THIRTY MINUTES BEFORE MEALS.  5 ADDITIONAL UNITS WITH CARBS OR SNACKS.   ONETOUCH VERIO test strip USE TO MONITOR GLUCOSE LEVELS TWICE DAILY   SYNTHROID 125 MCG tablet Take 125 mcg by mouth every morning.     Allergies:   Statins   Social History   Tobacco Use   Smoking status: Never Smoker   Smokeless tobacco: Never Used  Vaping Use   Vaping Use: Never used  Substance Use Topics   Alcohol use: No    Alcohol/week: 0.0 standard drinks   Drug use: Never     Family Hx: The patient's family history includes Headache in an other family member; Healthy in her son; Heart failure in her mother. There is no history of Colon cancer, Pancreatic cancer, Rectal cancer, or Stomach cancer. She was adopted.  ROS:   Please see the history of present illness.     All other systems reviewed and are negative.  Prior CV studies:   The following studies were reviewed today:  Echo 08/28/2018: IMPRESSIONS 1. The left ventricle has normal systolic function with an ejection fraction of 60-65%. The cavity size was normal. Indeterminate diastolic filling due to E-A fusion. Indeterminate filling pressures. 2. The right ventricle has normal systolic function. The cavity was normal. There is no increase in right ventricular wall thickness. 3. Left atrial size was moderately dilated. 4. The mitral valve is degenerative. Severe thickening of the mitral  valve leaflet. Severe calcification of the mitral valve leaflet. There is severe mitral annular calcification present. Mild mitral valve stenosis. 5. Severe MAC and leaflet thickening mild functional stenosis gradients lower than echo 07/09/18. 6. The aortic root is normal in size and structure. 7. TAVR: 23 mm Sapien 3 in good position with no PVL gradients stable since 07/09/18.  Echo 07/01/19   1. Left ventricular ejection fraction, by estimation, is 60 to 65%. The  left ventricle has normal function.  The left ventricle has no regional  wall motion abnormalities. There is mild left ventricular hypertrophy.  Left ventricular diastolic parameters  are consistent with Grade II diastolic dysfunction (pseudonormalization).  2. Right ventricular systolic function is normal. The right ventricular  size is normal.  3. Left atrial size was moderately dilated.  4. The mitral valve is grossly normal. No evidence of mitral valve  regurgitation.  5. The TAVR is not well visualized. There is no PVL or AI. The mean AV  gradient is normal at 13 mmHg. No significant changes . Marland Kitchen The aortic valve  has been repaired/replaced. Aortic valve regurgitation is not visualized.  Procedure Date: 07/08/18.   Labs/Other Tests and Data Reviewed:    EKG:  No ECG reviewed.  Recent Labs: 05/11/2019: TSH 14.81 09/11/2019: BUN 18; Creatinine, Ser 0.72; Potassium 4.2; Sodium 139 09/29/2019: ALT 41; Hemoglobin 10.6; Platelets 172.0   Recent Lipid Panel Lab Results  Component Value Date/Time   CHOL 128 10/22/2018 04:45 AM   CHOL 156 11/21/2016 11:13 AM   TRIG 95 10/22/2018 04:45 AM   HDL 24 (L) 10/22/2018 04:45 AM   HDL 44 11/21/2016 11:13 AM   CHOLHDL 5.3 10/22/2018 04:45 AM   LDLCALC 85 10/22/2018 04:45 AM   LDLCALC 91 11/21/2016 11:13 AM   LDLDIRECT 98.0 05/24/2014 01:43 PM    Wt Readings from Last 3 Encounters:  01/25/20 182 lb (82.6 kg)  12/21/19 183 lb (83 kg)  12/18/19 186 lb (84.4 kg)      Objective:    Vital Signs:  Ht _0  (1.651 m)    Wt 182 lb (82.6 kg)    BMI 30.29 kg/m    VITAL SIGNS:  reviewed GEN:  no acute distress NEURO:  alert and oriented x 3, no obvious focal deficit PSYCH:  normal affect  ASSESSMENT & PLAN:    1. PAF on chronic anticoagulation: -Reports no palpitations -Tolerating anticoagulation with on s/s bleeding in stool or urine -CHADSVASC of at least 6   2. Chronic LBBB: -No EKG performed today given telemedicine visit   3. CAD s/p PCI: -s/p DES to mRCA (03/2018). -No ASA in the setting of Eliquis  -No CV symptoms   5. Prior bilateral cerebellar strokes: -Reports frustration with right-sided weakness residual>> chronic with no change -Participates in PT -Follows closely with neurology  6. HLD:  -Last LDL, 85 on 10/22/2018 -May consider referral to HLD   7.Bacteremia/endocarditis from Strep Salivarius: -Follow up echocardiogram, stable   8. DM: -Per PCP  -Last HbA1c, 7.5  9. Chronic DOE/shortness of breath: -Felt to be multifactorial>>> seen by Dr. Aline Brochure with [facility] as a second opinion who felt dyspnea is due to severe impairment of diastolic inflow into the LV in the setting of a severe LVH and calcific mitral valve disease. He also felt cirrhosis and anemia were causing high output contributing to her symptoms as well.   -Plan was to optimize medications including combination of low-dose Lasix and spironolactone when necessary.  If she could tolerate a beta-blocker, could be more beneficial than diltiazem to regulate HR.  Plan was also for lifestyle modifications including weight loss. No medication changes were made   10. Prior TAVR 06/2018: -Stable per last echo    COVID-19 Education: The signs and symptoms of COVID-19 were discussed with the patient and how to seek care for testing (follow up with PCP or arrange E-visit).  The importance of social distancing was discussed today.  Time:   Today, I have spent  15  minutes with the patient with telehealth technology discussing the above problems.     Medication Adjustments/Labs and Tests Ordered: Current medicines are reviewed at length with the patient today.  Concerns regarding medicines are outlined above.   Tests Ordered: No orders of the defined types were placed in this encounter.   Medication Changes: No orders of the defined types were placed in this encounter.   Follow Up:  In Person Dr. Tamala Julian in 8 weeks   Signed, Kathyrn Drown, NP  01/25/2020 10:37 AM    [facility]

## 2020-01-21 ENCOUNTER — Other Ambulatory Visit: Payer: Self-pay | Admitting: *Deleted

## 2020-01-21 NOTE — Patient Outreach (Signed)
[facility]) Care Management  01/21/2020  KIA VARNADORE [birth year] [id number]  Unsuccessful outreach attempt made to patient. RN Health Coach left HIPAA compliant voicemail message along with her contact information.  Plan: RN Health Coach will call patient within the month of November.  [name] RN, BSN [facility] [phone] [email or web address]

## 2020-01-25 ENCOUNTER — Telehealth (INDEPENDENT_AMBULATORY_CARE_PROVIDER_SITE_OTHER): Payer: Medicare Other | Admitting: Cardiology

## 2020-01-25 ENCOUNTER — Encounter: Payer: Self-pay | Admitting: Cardiology

## 2020-01-25 ENCOUNTER — Telehealth: Payer: Self-pay | Admitting: *Deleted

## 2020-01-25 ENCOUNTER — Other Ambulatory Visit: Payer: Self-pay | Admitting: Adult Health

## 2020-01-25 ENCOUNTER — Other Ambulatory Visit: Payer: Self-pay | Admitting: Endocrinology

## 2020-01-25 VITALS — Ht 65.0 in | Wt 182.0 lb

## 2020-01-25 DIAGNOSIS — I639 Cerebral infarction, unspecified: Secondary | ICD-10-CM

## 2020-01-25 DIAGNOSIS — I10 Essential (primary) hypertension: Secondary | ICD-10-CM

## 2020-01-25 DIAGNOSIS — Z952 Presence of prosthetic heart valve: Secondary | ICD-10-CM

## 2020-01-25 DIAGNOSIS — I059 Rheumatic mitral valve disease, unspecified: Secondary | ICD-10-CM

## 2020-01-25 DIAGNOSIS — I2510 Atherosclerotic heart disease of native coronary artery without angina pectoris: Secondary | ICD-10-CM | POA: Diagnosis not present

## 2020-01-25 DIAGNOSIS — Z760 Encounter for issue of repeat prescription: Secondary | ICD-10-CM

## 2020-01-25 NOTE — Telephone Encounter (Signed)
°  Patient Consent for Virtual Visit         Breanna Webster has provided verbal consent on 01/25/2020 for a virtual visit (video or telephone).   CONSENT FOR VIRTUAL VISIT FOR:  Breanna Webster  By participating in this virtual visit I agree to the following:  I hereby voluntarily request, consent and authorize [facility] and its employed or contracted physicians, physician assistants, nurse practitioners or other licensed health care professionals (the Practitioner), to provide me with telemedicine health care services (the Services") as deemed necessary by the treating Practitioner. I acknowledge and consent to receive the Services by the Practitioner via telemedicine. I understand that the telemedicine visit will involve communicating with the Practitioner through live audiovisual communication technology and the disclosure of certain medical information by electronic transmission. I acknowledge that I have been given the opportunity to request an in-person assessment or other available alternative prior to the telemedicine visit and am voluntarily participating in the telemedicine visit.  I understand that I have the right to withhold or withdraw my consent to the use of telemedicine in the course of my care at any time, without affecting my right to future care or treatment, and that the Practitioner or I may terminate the telemedicine visit at any time. I understand that I have the right to inspect all information obtained and/or recorded in the course of the telemedicine visit and may receive copies of available information for a reasonable fee.  I understand that some of the potential risks of receiving the Services via telemedicine include:   Delay or interruption in medical evaluation due to technological equipment failure or disruption;  Information transmitted may not be sufficient (e.g. poor resolution of images) to allow for appropriate medical decision making by the  Practitioner; and/or   In rare instances, security protocols could fail, causing a breach of personal health information.  Furthermore, I acknowledge that it is my responsibility to provide information about my medical history, conditions and care that is complete and accurate to the best of my ability. I acknowledge that Practitioner's advice, recommendations, and/or decision may be based on factors not within their control, such as incomplete or inaccurate data provided by me or distortions of diagnostic images or specimens that may result from electronic transmissions. I understand that the practice of medicine is not an exact science and that Practitioner makes no warranties or guarantees regarding treatment outcomes. I acknowledge that a copy of this consent can be made available to me via my patient portal ([facility]), or I can request a printed copy by calling the office of [facility].    I understand that my insurance will be billed for this visit.   I have read or had this consent read to me.  I understand the contents of this consent, which adequately explains the benefits and risks of the Services being provided via telemedicine.   I have been provided ample opportunity to ask questions regarding this consent and the Services and have had my questions answered to my satisfaction.  I give my informed consent for the services to be provided through the use of telemedicine in my medical care

## 2020-01-25 NOTE — Patient Instructions (Addendum)
Medication Instructions:   Your physician recommends that you continue on your current medications as directed. Please refer to the Current Medication list given to you today.  *If you need a refill on your cardiac medications before your next appointment, please call your pharmacy*      Follow-Up: At [facility], you and your health needs are our priority.  As part of our continuing mission to provide you with exceptional heart care, we have created designated Provider Care Teams.  These Care Teams include your primary Cardiologist (physician) and Advanced Practice Providers (APPs -  Physician Assistants and Nurse Practitioners) who all work together to provide you with the care you need, when you need it.    Your next appointment:   8 week(s) 03/24/2020 at 1:40  The format for your next appointment:   In Person  Provider:   Daneen Schick, MD

## 2020-01-28 ENCOUNTER — Telehealth: Payer: Self-pay | Admitting: Adult Health

## 2020-01-28 DIAGNOSIS — Z8673 Personal history of transient ischemic attack (TIA), and cerebral infarction without residual deficits: Secondary | ICD-10-CM

## 2020-01-28 DIAGNOSIS — I480 Paroxysmal atrial fibrillation: Secondary | ICD-10-CM

## 2020-01-28 DIAGNOSIS — G4700 Insomnia, unspecified: Secondary | ICD-10-CM

## 2020-01-28 DIAGNOSIS — R400 Somnolence: Secondary | ICD-10-CM

## 2020-01-28 NOTE — Telephone Encounter (Signed)
Your 12-21-19 note does make mention of possible repeat SS since pt has lost weight.  Please advise.

## 2020-01-28 NOTE — Telephone Encounter (Signed)
Pt called, discussed last visit looking further into prior sleep study to repeat sleep study. I have not heard anything. Would like a call from the nurse

## 2020-02-01 ENCOUNTER — Other Ambulatory Visit: Payer: Self-pay | Admitting: Endocrinology

## 2020-02-01 NOTE — Telephone Encounter (Signed)
Yes based on the current stroke and still having symptoms , insurance will approve an inlab now.

## 2020-02-01 NOTE — Telephone Encounter (Signed)
Patient underwent home sleep study in 11/2018 which showed mild sleep apnea with AHI 4.2.  She had a stroke in 09/2018.  She continues to experience excessive daytime fatigue, insomnia and snoring and she questions accuracy of home sleep test that was previously completed.  Would she be eligible for a repeat sleep study if I was to refer her to Dr. Brett Fairy or Dr. Rexene Alberts?

## 2020-02-02 NOTE — Telephone Encounter (Signed)
I called and spoke to husband of pt.  I relayed that per JM/NP that a referral was placed to [facility] sleep and they will call her schedule.  He verbalized understanding.

## 2020-02-04 ENCOUNTER — Telehealth: Payer: Self-pay | Admitting: Adult Health

## 2020-02-04 DIAGNOSIS — R269 Unspecified abnormalities of gait and mobility: Secondary | ICD-10-CM | POA: Diagnosis not present

## 2020-02-04 DIAGNOSIS — G8929 Other chronic pain: Secondary | ICD-10-CM | POA: Diagnosis not present

## 2020-02-04 DIAGNOSIS — M546 Pain in thoracic spine: Secondary | ICD-10-CM | POA: Diagnosis not present

## 2020-02-04 DIAGNOSIS — Z7409 Other reduced mobility: Secondary | ICD-10-CM | POA: Diagnosis not present

## 2020-02-04 DIAGNOSIS — R29898 Other symptoms and signs involving the musculoskeletal system: Secondary | ICD-10-CM | POA: Diagnosis not present

## 2020-02-04 DIAGNOSIS — I69398 Other sequelae of cerebral infarction: Secondary | ICD-10-CM | POA: Diagnosis not present

## 2020-02-04 DIAGNOSIS — R2689 Other abnormalities of gait and mobility: Secondary | ICD-10-CM | POA: Diagnosis not present

## 2020-02-04 NOTE — Telephone Encounter (Signed)
[facility] Roderic Palau) called, referral only covers Gait balance post stroke. Can you do referral to include left hand and arm weakness. For thoracic back pain can you write  ICD code in addition to  Gait and balance?  Would like to have a referral to cover all. Would like a call back.

## 2020-02-04 NOTE — Telephone Encounter (Signed)
Spoke to [facility] re: referral for PT, ok to L hand/arm weakness, looks like NS did for DDD/thoracic back pain ok ot do??  Need ICDfor those as well. Please advise.  Relayed that you our to office.

## 2020-02-06 ENCOUNTER — Ambulatory Visit: Payer: Medicare Other | Attending: Internal Medicine

## 2020-02-06 DIAGNOSIS — Z23 Encounter for immunization: Secondary | ICD-10-CM

## 2020-02-09 NOTE — Addendum Note (Signed)
Addended by: Oliver Hum S on: 02/09/2020 01:05 PM   Modules accepted: Orders

## 2020-02-09 NOTE — Telephone Encounter (Signed)
Per jon[facility] PT request,  New order placed to include both gait disturbance and LUE weakness.  [phone].

## 2020-02-09 NOTE — Telephone Encounter (Signed)
I spoke to [place] at [facility] PT and relayed what JM/NP stated that she was ok to add the LUE weakness to order.

## 2020-02-09 NOTE — Telephone Encounter (Signed)
Order previously placed for gait impairment and imbalance.  If PT is able to work with her for LUE weakness/decreased dexterity, okay to add those ICD codes if this is what they require.  Otherwise, she may need referral to OT.

## 2020-02-11 DIAGNOSIS — R29898 Other symptoms and signs involving the musculoskeletal system: Secondary | ICD-10-CM | POA: Diagnosis not present

## 2020-02-11 DIAGNOSIS — Z7409 Other reduced mobility: Secondary | ICD-10-CM | POA: Diagnosis not present

## 2020-02-11 DIAGNOSIS — I69398 Other sequelae of cerebral infarction: Secondary | ICD-10-CM | POA: Diagnosis not present

## 2020-02-11 DIAGNOSIS — R2689 Other abnormalities of gait and mobility: Secondary | ICD-10-CM | POA: Diagnosis not present

## 2020-02-11 DIAGNOSIS — G8929 Other chronic pain: Secondary | ICD-10-CM | POA: Diagnosis not present

## 2020-02-11 DIAGNOSIS — M546 Pain in thoracic spine: Secondary | ICD-10-CM | POA: Diagnosis not present

## 2020-02-11 DIAGNOSIS — R269 Unspecified abnormalities of gait and mobility: Secondary | ICD-10-CM | POA: Diagnosis not present

## 2020-02-12 ENCOUNTER — Inpatient Hospital Stay (HOSPITAL_BASED_OUTPATIENT_CLINIC_OR_DEPARTMENT_OTHER): Payer: Medicare Other | Admitting: Hematology

## 2020-02-12 ENCOUNTER — Inpatient Hospital Stay: Payer: Medicare Other | Attending: Hematology

## 2020-02-12 ENCOUNTER — Other Ambulatory Visit: Payer: Self-pay

## 2020-02-12 VITALS — BP 130/61 | HR 81 | Temp 97.6°F | Resp 18 | Ht 65.0 in | Wt 182.9 lb

## 2020-02-12 DIAGNOSIS — R0602 Shortness of breath: Secondary | ICD-10-CM | POA: Insufficient documentation

## 2020-02-12 DIAGNOSIS — F419 Anxiety disorder, unspecified: Secondary | ICD-10-CM | POA: Insufficient documentation

## 2020-02-12 DIAGNOSIS — E538 Deficiency of other specified B group vitamins: Secondary | ICD-10-CM

## 2020-02-12 DIAGNOSIS — I10 Essential (primary) hypertension: Secondary | ICD-10-CM | POA: Diagnosis not present

## 2020-02-12 DIAGNOSIS — D649 Anemia, unspecified: Secondary | ICD-10-CM

## 2020-02-12 DIAGNOSIS — Z7901 Long term (current) use of anticoagulants: Secondary | ICD-10-CM | POA: Diagnosis not present

## 2020-02-12 DIAGNOSIS — Z79899 Other long term (current) drug therapy: Secondary | ICD-10-CM | POA: Diagnosis not present

## 2020-02-12 DIAGNOSIS — I639 Cerebral infarction, unspecified: Secondary | ICD-10-CM

## 2020-02-12 DIAGNOSIS — K219 Gastro-esophageal reflux disease without esophagitis: Secondary | ICD-10-CM | POA: Insufficient documentation

## 2020-02-12 DIAGNOSIS — E039 Hypothyroidism, unspecified: Secondary | ICD-10-CM | POA: Insufficient documentation

## 2020-02-12 DIAGNOSIS — R251 Tremor, unspecified: Secondary | ICD-10-CM | POA: Insufficient documentation

## 2020-02-12 DIAGNOSIS — Z952 Presence of prosthetic heart valve: Secondary | ICD-10-CM | POA: Insufficient documentation

## 2020-02-12 DIAGNOSIS — K7460 Unspecified cirrhosis of liver: Secondary | ICD-10-CM | POA: Insufficient documentation

## 2020-02-12 DIAGNOSIS — E118 Type 2 diabetes mellitus with unspecified complications: Secondary | ICD-10-CM | POA: Insufficient documentation

## 2020-02-12 DIAGNOSIS — I2510 Atherosclerotic heart disease of native coronary artery without angina pectoris: Secondary | ICD-10-CM | POA: Insufficient documentation

## 2020-02-12 DIAGNOSIS — D509 Iron deficiency anemia, unspecified: Secondary | ICD-10-CM

## 2020-02-12 DIAGNOSIS — I6529 Occlusion and stenosis of unspecified carotid artery: Secondary | ICD-10-CM | POA: Diagnosis not present

## 2020-02-12 DIAGNOSIS — D563 Thalassemia minor: Secondary | ICD-10-CM | POA: Diagnosis not present

## 2020-02-12 LAB — CBC WITH DIFFERENTIAL/PLATELET
Abs Immature Granulocytes: 0.02 10*3/uL (ref 0.00–0.07)
Basophils Absolute: 0.1 10*3/uL (ref 0.0–0.1)
Basophils Relative: 1 %
Eosinophils Absolute: 0.3 10*3/uL (ref 0.0–0.5)
Eosinophils Relative: 5 %
HCT: 33.1 % — ABNORMAL LOW (ref 36.0–46.0)
Hemoglobin: 10.3 g/dL — ABNORMAL LOW (ref 12.0–15.0)
Immature Granulocytes: 0 %
Lymphocytes Relative: 24 %
Lymphs Abs: 1.2 10*3/uL (ref 0.7–4.0)
MCH: 20.1 pg — ABNORMAL LOW (ref 26.0–34.0)
MCHC: 31.1 g/dL (ref 30.0–36.0)
MCV: 64.5 fL — ABNORMAL LOW (ref 80.0–100.0)
Monocytes Absolute: 0.4 10*3/uL (ref 0.1–1.0)
Monocytes Relative: 7 %
Neutro Abs: 3.2 10*3/uL (ref 1.7–7.7)
Neutrophils Relative %: 63 %
Platelets: 200 10*3/uL (ref 150–400)
RBC: 5.13 MIL/uL — ABNORMAL HIGH (ref 3.87–5.11)
RDW: 16.4 % — ABNORMAL HIGH (ref 11.5–15.5)
WBC: 5.1 10*3/uL (ref 4.0–10.5)
nRBC: 0 % (ref 0.0–0.2)

## 2020-02-12 LAB — FERRITIN: Ferritin: 243 ng/mL (ref 11–307)

## 2020-02-12 LAB — CMP (CANCER CENTER ONLY)
ALT: 39 U/L (ref 0–44)
AST: 38 U/L (ref 15–41)
Albumin: 3.9 g/dL (ref 3.5–5.0)
Alkaline Phosphatase: 79 U/L (ref 38–126)
Anion gap: 7 (ref 5–15)
BUN: 14 mg/dL (ref 8–23)
CO2: 27 mmol/L (ref 22–32)
Calcium: 10.3 mg/dL (ref 8.9–10.3)
Chloride: 102 mmol/L (ref 98–111)
Creatinine: 0.77 mg/dL (ref 0.44–1.00)
GFR, Estimated: >60 mL/min (ref >60)
Glucose, Bld: 167 mg/dL — ABNORMAL HIGH (ref 70–99)
Potassium: 4.2 mmol/L (ref 3.5–5.1)
Sodium: 136 mmol/L (ref 135–145)
Total Bilirubin: 1.1 mg/dL (ref 0.3–1.2)
Total Protein: 7 g/dL (ref 6.5–8.1)

## 2020-02-12 LAB — IRON AND TIBC
Iron: 123 ug/dL (ref 41–142)
Saturation Ratios: 42 % (ref 21–57)
TIBC: 293 ug/dL (ref 236–444)
UIBC: 170 ug/dL (ref 120–384)

## 2020-02-12 NOTE — Progress Notes (Signed)
HEMATOLOGY/ONCOLOGY CONSULTATION NOTE  Date of Service: 02/12/2020  Patient Care Team: [name], NP as PCP - General (Family Medicine) [name], MD as PCP - Cardiology (Cardiology) Madelin Rear, MD as Consulting Physician (Endocrinology) Laretta Alstrom, [name], RN as [place] [facility] [place], [place], [facility] as Pharmacist (Pharmacist)  REFERRING PHYSICIAN: [name], NP  CHIEF COMPLAINTS/PURPOSE OF CONSULTATION:   Thalassemia minor IDA   HISTORY OF PRESENTING ILLNESS:   Breanna Webster is a wonderful 74 y.o. female who has been referred to [name] by [name], NP for evaluation and management of Thalassemia minor. She is accompanied today by her husband. The pt reports that she is doing well overall.   The pt reports On 04/22/2018 she had Cardiac catheterization. The   07/08/2018- Infection around the heart, endocarditis.  Mitral valve infections.  She had a stroke in April 2020.   She gets shortness of breath when walking more than 50 steps. Before April 2020 she was very active. She is also having difficulty with balance.  Doctors are unsure as to why she is having difficulty breathing.  She has had radiation treatment to half of her thyroid.  She has not noticed any improvement since her valve placement  She has never had IV transfusions   She use to take B12 shots in her 78s. She is unsure why they were stopped. Possibly stopped because tests showed she no longer needed B12.   She reports that she was diagnosed with a fatty liver.  She is taking synthroid 125 MCG and eliquis.    Most recent lab results (04/28/2018) of CBC is as follows: all values are WNL except for RBC at 5.44, Hemoglobin at 9.0, Hct at 29.8, MCV at 54.8, RDW at 19.8, Monocytes Relative 2.0, ALT at 39, INR at 1.6, Prothrombin Time at 18.4.  On review of systems, pt reports shortness of breath when walking or being active, fatigue, steady weight, and  denies chest pain, leg swelling and any other symptoms.   On PMHx the pt reports liver cirrhosis, diagnosed with Thalassemia minor 20 years ago. Diabetes.   On Social Hx the pt reports never smoked. Never consumed alcohol.   On Family Hx the pt reports son has Thalassemia minor.  INTERVAL HISTORY:  Breanna Webster is a 74 y.o. female here for evaluation and management of Thalassemia minor. We are joined today by her husband. The patient's last visit with [name] was on 08/14/2019. The pt reports that she is doing well overall.  The pt reports that she has been feeling better recently and has more energy. Pt had some symptoms for about 36 hours after receiving her COVID19 booster. Pt continues to experience a tremor in her left arm, for which she has been following with a Neurologist and receiving therapy. Pt is planning on participating in a sleep study. She was having significant constipation and abdominal cramping with oral iron. She used it for a few months before being taken off. Pt's thyroid function has been stable. She denies any abnormal or excessive bleeding with Eliquis.  Lab results today (02/12/20) of CBC w/diff and CMP is as follows: all values are WNL except for RBC at 5.13, Hgb at 10.3, HCT at 33.1, MCV at 64.5, MCH at 20.1, RDW at 16.4, Glucose at 167. 02/12/2020 Iron Panel is as follows: Iron at 123, TIBC at 293, Sat Ratios at 42, UIBC at 170 02/12/2020 Ferritin at 243  On review of systems, pt reports left  arm tremour and denies worsening fatigue, bloody stools, melena, excessive bleeding, abdominal pain and any other symptoms.   MEDICAL HISTORY:  Past Medical History:  Diagnosis Date  . Anxiety   . Arthritis    "back" (04/22/2018)  . Back pain   . Blood transfusion without reported diagnosis   . CAD (coronary artery disease)    a. 03/2018 s/p PCI/DES to the RCA (3.0x15 Onyx DES).  . Carotid artery stenosis    Mild  . Chest pain   . Chronic lower back pain   .  Cirrhosis ([place])   . Colon polyps   . Diverticulitis   . Diverticulosis   . Esophageal thickening    seen on pre TAVR CT scan, also questionable cirrhosis. MRI recommended. Will refer to GI  . Fatty liver   . GERD (gastroesophageal reflux disease)   . Grave's disease   . History of colonic polyps 05/22/2017  . History of hiatal hernia   . Hypertension   . Hypothyroidism   . IBS (irritable bowel syndrome)   . Osteopenia   . Pulmonary nodules    seen on pre TAVR CT. likley benign. no follow up recommended if pt low risk.  . S/P TAVR (transcatheter aortic valve replacement)   . Severe aortic stenosis   . Shortness of breath on exertion   . Stroke ([place])   . Thalassemia minor   . Thyroid disease   . Type II diabetes mellitus ([place])      SURGICAL HISTORY: Past Surgical History:  Procedure Laterality Date  . [place] STUDY N/A 03/03/2018   Procedure: [place] STUDY;  Surgeon: Mauri Pole, MD;  Location: [facility];  Service: Endoscopy;  Laterality: N/A;  . COLONOSCOPY    . COLONOSCOPY W/ BIOPSIES AND POLYPECTOMY    . CORONARY ANGIOGRAPHY Right 04/21/2018   Procedure: CORONARY ANGIOGRAPHY (CATH LAB);  Surgeon: [name], MD;  Location: [place] CV LAB;  Service: Cardiovascular;  Laterality: Right;  . CORONARY STENT INTERVENTION N/A 04/22/2018   Procedure: CORONARY STENT INTERVENTION;  Surgeon: [name], MD;  Location: [place] CV LAB;  Service: Cardiovascular;  Laterality: N/A;  . DILATION AND CURETTAGE OF UTERUS    . ESOPHAGEAL MANOMETRY N/A 03/03/2018   Procedure: ESOPHAGEAL MANOMETRY (EM);  Surgeon: Mauri Pole, MD;  Location: [facility];  Service: Endoscopy;  Laterality: N/A;  . GASTRIC FUNDOPLICATION    . HERNIA REPAIR    . HYSTEROSCOPY     fibroids  . LAPAROSCOPIC CHOLECYSTECTOMY    . LAPAROSCOPY     fibroids  . NISSEN FUNDOPLICATION  [id number]  . POLYPECTOMY    . RIGHT/LEFT HEART CATH AND CORONARY ANGIOGRAPHY N/A 02/20/2018    Procedure: RIGHT/LEFT HEART CATH AND CORONARY ANGIOGRAPHY;  Surgeon: [name], MD;  Location: [place] CV LAB;  Service: Cardiovascular;  Laterality: N/A;  . TEE WITHOUT CARDIOVERSION N/A 07/08/2018   Procedure: TRANSESOPHAGEAL ECHOCARDIOGRAM (TEE);  Surgeon: Burnell Blanks, MD;  Location: [place];  Service: Open Heart Surgery;  Laterality: N/A;  . TEE WITHOUT CARDIOVERSION  10/07/2018  . TEE WITHOUT CARDIOVERSION N/A 10/07/2018   Procedure: TRANSESOPHAGEAL ECHOCARDIOGRAM (TEE);  Surgeon: Jerline Pain, MD;  Location: [facility] ENDOSCOPY;  Service: Cardiovascular;  Laterality: N/A;  . TONSILLECTOMY    . TRANSCATHETER AORTIC VALVE REPLACEMENT, TRANSFEMORAL N/A 07/08/2018   Procedure: TRANSCATHETER AORTIC VALVE REPLACEMENT, TRANSFEMORAL;  Surgeon: Burnell Blanks, MD;  Location: [place];  Service: Open Heart Surgery;  Laterality: N/A;  SOCIAL HISTORY: Social History   Socioeconomic History  . Marital status: Married    Spouse name: Not on file  . Number of children: 2  . Years of education: Not on file  . Highest education level: Not on file  Occupational History  . Occupation: Tree surgeon of the [employer]  Tobacco Use  . Smoking status: Never Smoker  . Smokeless tobacco: Never Used  Vaping Use  . Vaping Use: Never used  Substance and Sexual Activity  . Alcohol use: No    Alcohol/week: 0.0 standard drinks  . Drug use: Never  . Sexual activity: Not Currently  Other Topics Concern  . Not on file  Social History Narrative   Lives with husband in a one story home.     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   Education: college.   Social Determinants of Health   Financial Resource Strain: Low Risk   . Difficulty of Paying Living Expenses: Not very hard  Food Insecurity: No Food Insecurity  . Worried About Charity fundraiser in the Last Year: Never true  . Ran Out of Food in  the Last Year: Never true  Transportation Needs: No Transportation Needs  . Lack of Transportation (Medical): No  . Lack of Transportation (Non-Medical): No  Physical Activity: Insufficiently Active  . Days of Exercise per Week: 6 days  . Minutes of Exercise per Session: 20 min  Stress:   . Feeling of Stress : Not on file  Social Connections:   . Frequency of Communication with Friends and Family: Not on file  . Frequency of Social Gatherings with Friends and Family: Not on file  . Attends Religious Services: Not on file  . Active Member of Clubs or Organizations: Not on file  . Attends Archivist Meetings: Not on file  . Marital Status: Not on file  Intimate Partner Violence:   . Fear of Current or Ex-Partner: Not on file  . Emotionally Abused: Not on file  . Physically Abused: Not on file  . Sexually Abused: Not on file     FAMILY HISTORY: Family History  Adopted: Yes  Problem Relation Age of Onset  . Healthy Son        x 2  . Headache Other        Cluster headaches  . Heart failure Mother   . Colon cancer Neg Hx   . Pancreatic cancer Neg Hx   . Rectal cancer Neg Hx   . Stomach cancer Neg Hx    ALLERGIES:   is allergic to statins.   MEDICATIONS:  Current Outpatient Medications  Medication Sig Dispense Refill  . amitriptyline (ELAVIL) 25 MG tablet TAKE 1 TABLET(25 MG) BY MOUTH AT BEDTIME 90 tablet 1  . BD INSULIN SYRINGE U/F 31G X 5/16" 1 ML MISC INJECT 4 TIMES DAILY SUBCUTANEOUSLY 100 each 3  . Cholecalciferol (VITAMIN D3 PO) Take 1 tablet by mouth daily.    Marland Kitchen diltiazem (CARDIZEM CD) 120 MG 24 hr capsule TAKE 1 CAPSULE(120 MG) BY MOUTH DAILY 90 capsule 1  . ELIQUIS 5 MG TABS tablet TAKE 1 TABLET BY MOUTH TWICE DAILY 180 tablet 1  . Lancets (ONETOUCH DELICA PLUS [id number]) MISC USE TO CHECK BLOOD SUGAR TWICE DAILY.. 200 each 0  . losartan (COZAAR) 50 MG tablet Take 50 mg by mouth daily.    . metFORMIN (GLUCOPHAGE) 500 MG tablet Take 500 mg by  mouth 2  (two) times daily with a meal.     . Multiple Vitamin (MULITIVITAMIN WITH MINERALS) TABS Take 1 tablet by mouth daily.    . nitroGLYCERIN (NITROSTAT) 0.4 MG SL tablet Place 0.4 mg under the tongue every 5 (five) minutes as needed for chest pain.     Marland Kitchen NOVOLIN N 100 UNIT/ML injection Inject 40 Units into the skin daily before breakfast.     . NOVOLIN R 100 UNIT/ML injection 10 UNITS THIRTY MINUTES BEFORE MEALS.  5 ADDITIONAL UNITS WITH CARBS OR SNACKS.    [name] VERIO test strip USE TO MONITOR GLUCOSE LEVELS TWICE DAILY 200 strip 3  . SYNTHROID 125 MCG tablet Take 125 mcg by mouth every morning.     No current facility-administered medications for this visit.     REVIEW OF SYSTEMS:   A 10+ POINT REVIEW OF SYSTEMS WAS OBTAINED including neurology, dermatology, psychiatry, cardiac, respiratory, lymph, extremities, GI, GU, Musculoskeletal, constitutional, breasts, reproductive, HEENT.  All pertinent positives are noted in the HPI.  All others are negative.   PHYSICAL EXAMINATION: Vitals:   02/12/20 1141  BP: 130/61  Pulse: 81  Resp: 18  Temp: 97.6 F (36.4 C)  SpO2: 98%   Wt Readings from Last 3 Encounters:  02/12/20 182 lb 14.4 oz (83 kg)  01/25/20 182 lb (82.6 kg)  12/21/19 183 lb (83 kg)   Body mass index is 30.44 kg/m.   Exam was given in a chair   GENERAL:alert, in no acute distress and comfortable SKIN: no acute rashes, no significant lesions EYES: conjunctiva are pink and non-injected, sclera anicteric OROPHARYNX: MMM, no exudates, no oropharyngeal erythema or ulceration NECK: supple, no JVD LYMPH:  no palpable lymphadenopathy in the cervical, axillary or inguinal regions LUNGS: clear to auscultation b/l with normal respiratory effort HEART: regular rate & rhythm ABDOMEN:  normoactive bowel sounds , non tender, not distended. No palpable hepatosplenomegaly.  Extremity: no pedal edema PSYCH: alert & oriented x 3 with fluent speech NEURO: no focal motor/sensory  deficits  LABORATORY DATA:  I have reviewed the data as listed  CBC Latest Ref Rng & Units 02/12/2020 09/29/2019 09/11/2019  WBC 4.0 - 10.5 K/uL 5.1 6.7 7.3  Hemoglobin 12.0 - 15.0 g/dL 10.3(L) 10.6(L) 10.7(L)  Hematocrit 36 - 46 % 33.1(L) 32.9(L) 35.0(L)  Platelets 150 - 400 K/uL 200 172.0 187   . CBC    Component Value Date/Time   WBC 5.1 02/12/2020 1122   RBC 5.13 (H) 02/12/2020 1122   HGB 10.3 (L) 02/12/2020 1122   HGB 11.0 (L) 11/12/2018 0939   HCT 33.1 (L) 02/12/2020 1122   HCT 31.6 (L) 04/30/2019 1149   PLT 200 02/12/2020 1122   PLT 238 11/12/2018 0939   MCV 64.5 (L) 02/12/2020 1122   MCV 65 (L) 11/12/2018 0939   MCH 20.1 (L) 02/12/2020 1122   MCHC 31.1 02/12/2020 1122   RDW 16.4 (H) 02/12/2020 1122   RDW 18.2 (H) 11/12/2018 0939   LYMPHSABS 1.2 02/12/2020 1122   LYMPHSABS 1.2 02/26/2017 1054   MONOABS 0.4 02/12/2020 1122   EOSABS 0.3 02/12/2020 1122   EOSABS 0.1 02/26/2017 1054   BASOSABS 0.1 02/12/2020 1122   BASOSABS 0.0 02/26/2017 1054   . CMP Latest Ref Rng & Units 02/12/2020 09/29/2019 09/11/2019  Glucose 70 - 99 mg/dL 167(H) - 142(H)  BUN 8 - 23 mg/dL 14 - 18  Creatinine 0.44 - 1.00 mg/dL 0.77 - 0.72  Sodium 135 - 145 mmol/L 136 - 139  Potassium 3.5 - 5.1 mmol/L 4.2 - 4.2  Chloride 98 - 111 mmol/L 102 - 103  CO2 22 - 32 mmol/L 27 - 27  Calcium 8.9 - 10.3 mg/dL 10.3 - 10.3  Total Protein 6.5 - 8.1 g/dL 7.0 7.0 7.5  Total Bilirubin 0.3 - 1.2 mg/dL 1.1 1.1 1.1  Alkaline Phos 38 - 126 U/L 79 75 72  AST 15 - 41 U/L 38 36 48(H)  ALT 0 - 44 U/L 39 41(H) 55(H)   . Lab Results  Component Value Date   IRON 123 02/12/2020   TIBC 293 02/12/2020   IRONPCTSAT 42 02/12/2020   (Iron and TIBC)  Lab Results  Component Value Date   FERRITIN 243 02/12/2020   Component     Latest Ref Rng & Units 04/30/2019  IgG (Immunoglobin G), Serum     586 - 1,602 mg/dL 1,299  IgA     64 - 422 mg/dL 342  IgM (Immunoglobulin M), Srm     26 - 217 mg/dL 108  Total Protein  ELP     6.0 - 8.5 g/dL 7.0  Albumin SerPl Elph-Mcnc     2.9 - 4.4 g/dL 3.5  Alpha 1     0.0 - 0.4 g/dL 0.2  Alpha2 Glob SerPl Elph-Mcnc     0.4 - 1.0 g/dL 0.7  B-Globulin SerPl Elph-Mcnc     0.7 - 1.3 g/dL 1.3  Gamma Glob SerPl Elph-Mcnc     0.4 - 1.8 g/dL 1.3  M Protein SerPl Elph-Mcnc     Not Observed g/dL Not Observed  Globulin, Total     2.2 - 3.9 g/dL 3.5  Albumin/Glob SerPl     0.7 - 1.7 1.1  IFE 1      Comment  Please Note (HCV):      Comment  Hgb A2 Quant     1.8 - 3.2 % 4.1 (H)  Hgb F Quant     0.0 - 2.0 % 0.0  Hgb S Quant     0.0 % 0.0  HGB C     0.0 % 0.0  Hgb A     96.4 - 98.8 % 95.9 (L)  HGB VARIANT     0.0 % 0.0  Please Note:      Comment  Iron     41 - 142 ug/dL 27 (L)  TIBC     236 - 444 ug/dL 485 (H)  Saturation Ratios     21 - 57 % 6 (L)  UIBC     120 - 384 ug/dL 458 (H)  Folate, Hemolysate     Not Estab. ng/mL 463.0  HCT     34.0 - 46.6 % 31.6 (L)  Folate, RBC     >498 ng/mL 1,465  Haptoglobin     42 - 346 mg/dL 65  LDH     98 - 192 U/L 194 (H)  T4,Free(Direct)     0.61 - 1.12 ng/dL 0.89  TSH     0.308 - 3.960 uIU/mL 5.935 (H)  Vitamin B12     180 - 914 pg/mL 575  Ferritin     11 - 307 ng/mL 10 (L)    RADIOGRAPHIC STUDIES: I have personally reviewed the radiological images as listed and agreed with the findings in the report. MR CERVICAL SPINE WO CONTRAST  Result Date: 01/15/2020 CLINICAL DATA:  Pain in right axilla. Additional history provided by scanning technologist: Patient reports neck pain radiating down to right upper extremity, right axillary  pain, mid back pain, low back pain, symptoms for 3 months EXAM: MRI CERVICAL SPINE WITHOUT CONTRAST TECHNIQUE: Multiplanar, multisequence MR imaging of the cervical spine was performed. No intravenous contrast was administered. COMPARISON:  Radiographs of the cervical spine 11/27/2019. CT head 11/17/2018. Brain MRI 10/22/2018. FINDINGS: Alignment: Trace C5-C6 grade 1 anterolisthesis.  Vertebrae: Mild chronic T1 superior endplate deformity. C7 and T1 vertebral body hemangiomas. No significant marrow edema or suspicious osseous lesion. Cord: No spinal cord signal abnormality is identified. Posterior Fossa, vertebral arteries, paraspinal tissues: Known small chronic infarcts within the visualized bilateral cerebellar hemispheres. Flow voids preserved within the imaged cervical vertebral arteries. Paraspinal soft tissues within normal limits. Incidentally noted prominent submucosal fat within the posterior larynx. Disc levels: Multilevel disc degeneration. C2-C3: No significant disc herniation or stenosis. C3-C4: Facet hypertrophy (greater on the left). No significant spinal canal stenosis. Mild relative left neural foraminal narrowing. C4-C5: Facet hypertrophy (greater on the left). No significant spinal canal stenosis. Mild relative left neural foraminal narrowing. C5-C6: Facet hypertrophy (greater on the left). No significant spinal canal stenosis. Mild relative left neural foraminal narrowing. C6-C7: Facet hypertrophy (greater on the left). No significant disc herniation or stenosis. C7-T1: Facet hypertrophy (greater on the left). No significant disc herniation or stenosis. IMPRESSION: Cervical spondylosis as outlined and with facet arthrosis being the predominant feature, particularly on the left. No significant disc herniation or spinal canal stenosis at any level. Mild relative left neural foraminal narrowing at C3-C4, C4-C5 and C5-C6. Electronically Signed   By: Kellie Simmering DO   On: 01/15/2020 13:13   MR THORACIC SPINE WO CONTRAST  Result Date: 01/15/2020 EXAM: MRI THORACIC SPINE WITHOUT CONTRAST TECHNIQUE: Multiplanar, multisequence MR imaging of the thoracic spine was performed. No intravenous contrast was administered. COMPARISON:  None. FINDINGS: Alignment:  Physiologic. Vertebrae: Vertebral body heights are maintained. Mild heterogeneity of the bone marrow diffusely. Edema involving  the anterior endplates surrounding Z6-X0, compatible with degenerative change. Benign vertebral venous malformation at T11. Otherwise, no focal abnormal marrow signal to suggest acute fracture, osteomyelitis, or abnormal bone lesion. Cord:  Normal signal and morphology. Paraspinal and other soft tissues: No acute abnormality. Disc levels: Mild multilevel degenerative change without significant canal or foraminal stenosis. IMPRESSION: 1. No acute abnormality. 2. Mild multilevel degenerative change without significant canal or foraminal stenosis. 3. Mild heterogeneity of the bone marrow diffusely, which is nonspecific but may relate to the patient's reported thalassemia. Electronically Signed   By: Margaretha Sheffield MD   On: 01/15/2020 13:24     ASSESSMENT & PLAN:   Breanna Webster is a 74 y.o. female with:  1. Moderate Microcytic Anemia 2. H/o Beta thalassemia minor with baseline hgb likely in the 10-11 range 3. Iron deficiency   PLAN: -Discussed pt labwork today, 02/12/20; WBC & PLT are nml, Hgb is as baseline, blood chemistries are nml, Ferritin is holding steady (at goal), Iron Sat looks good. -Encouraged pt to increase dietary iron. -Will consider IV Iron or PO Iron Polysaccharide if dietary iron not holding Ferritin levels.  -Recommend pt f/u with Neurology for tremor management  -Will see back in 6 months with labs    FOLLOW UP: RTC with Dr Irene Limbo with labs in 6 months   The total time spent in the appt was 20 minutes and more than 50% was on counseling and direct patient cares.  All of the patient's questions were answered with apparent satisfaction. The patient knows to call the clinic with any problems, questions or concerns.  Sullivan Lone MD [place] AAHIVMS [facility] Hematology/Oncology Physician [facility]  (Office):       [phone] (Work cell):  [phone] (Fax):           [phone]  02/12/2020 1:02 PM   I, Yevette Edwards, am acting as a scribe for  Dr. Sullivan Lone.   .I have reviewed the above documentation for accuracy and completeness, and I agree with the above. [name] MD

## 2020-02-15 ENCOUNTER — Other Ambulatory Visit: Payer: Self-pay | Admitting: Obstetrics

## 2020-02-15 DIAGNOSIS — Z1231 Encounter for screening mammogram for malignant neoplasm of breast: Secondary | ICD-10-CM

## 2020-02-18 DIAGNOSIS — M546 Pain in thoracic spine: Secondary | ICD-10-CM | POA: Diagnosis not present

## 2020-02-18 DIAGNOSIS — G8929 Other chronic pain: Secondary | ICD-10-CM | POA: Diagnosis not present

## 2020-02-18 DIAGNOSIS — R269 Unspecified abnormalities of gait and mobility: Secondary | ICD-10-CM | POA: Diagnosis not present

## 2020-02-18 DIAGNOSIS — Z7409 Other reduced mobility: Secondary | ICD-10-CM | POA: Diagnosis not present

## 2020-02-18 DIAGNOSIS — R29898 Other symptoms and signs involving the musculoskeletal system: Secondary | ICD-10-CM | POA: Diagnosis not present

## 2020-02-18 DIAGNOSIS — R2689 Other abnormalities of gait and mobility: Secondary | ICD-10-CM | POA: Diagnosis not present

## 2020-02-18 DIAGNOSIS — I69398 Other sequelae of cerebral infarction: Secondary | ICD-10-CM | POA: Diagnosis not present

## 2020-03-01 DIAGNOSIS — I69398 Other sequelae of cerebral infarction: Secondary | ICD-10-CM | POA: Diagnosis not present

## 2020-03-01 DIAGNOSIS — R269 Unspecified abnormalities of gait and mobility: Secondary | ICD-10-CM | POA: Diagnosis not present

## 2020-03-01 DIAGNOSIS — R29898 Other symptoms and signs involving the musculoskeletal system: Secondary | ICD-10-CM | POA: Diagnosis not present

## 2020-03-01 DIAGNOSIS — Z7409 Other reduced mobility: Secondary | ICD-10-CM | POA: Diagnosis not present

## 2020-03-01 DIAGNOSIS — G8929 Other chronic pain: Secondary | ICD-10-CM | POA: Diagnosis not present

## 2020-03-01 DIAGNOSIS — M546 Pain in thoracic spine: Secondary | ICD-10-CM | POA: Diagnosis not present

## 2020-03-01 DIAGNOSIS — R2689 Other abnormalities of gait and mobility: Secondary | ICD-10-CM | POA: Diagnosis not present

## 2020-03-02 ENCOUNTER — Other Ambulatory Visit: Payer: Self-pay | Admitting: *Deleted

## 2020-03-02 NOTE — Patient Outreach (Signed)
[facility]) Care Management  [place]  03/02/2020   Breanna Webster 11-May-1945 [id number]  Subjective: Successful telephone outreach call to patient. HIPAA identifiers obtained. Patient states she is doing well. Patient reports she has been going to PT to improve her balance issues due to residual from previous CVA. Patient reports that this is helping and she plans to continue to do these exercises as well as go to the gym after completion of PT to maintain her strength. Patient reports that she continues to be restricted from driving due to spontaneous double vision and dizziness and is hoping that this will improve for her to be able to drive in the future. Presently, she is well supported by her husband [name]. She states that she has not had any falls and her home environment is safe.   Patient reports that her last A1c was 6.2. She explains that her blood sugar ranges have been good and she continues to adhere to an ADA diet plan. Patient states that her blood sugar drops low occasionally if she does not eat before lunch or dinner. Nurse encouraged patient to continue with snacks and will send her Glucerna coupons of which the patient reports she drinks. Patient also explained that her abdomen is sore from the 4 injections that she gives herself daily. Nurse educated about using different injection sites and suggested a rotating. Nurse will send different injection sites and rotation education. Patient did not have any further questions or concerns today.   Encounter Medications:  Outpatient Encounter Medications as of 03/02/2020  Medication Sig  . amitriptyline (ELAVIL) 25 MG tablet TAKE 1 TABLET(25 MG) BY MOUTH AT BEDTIME  . BD INSULIN SYRINGE U/F 31G X 5/16" 1 ML MISC INJECT 4 TIMES DAILY SUBCUTANEOUSLY  . Cholecalciferol (VITAMIN D3 PO) Take 1 tablet by mouth daily.  Marland Kitchen diltiazem (CARDIZEM CD) 120 MG 24 hr capsule TAKE 1 CAPSULE(120 MG) BY MOUTH DAILY  .  ELIQUIS 5 MG TABS tablet TAKE 1 TABLET BY MOUTH TWICE DAILY  . Lancets (ONETOUCH DELICA PLUS [id number]) MISC USE TO CHECK BLOOD SUGAR TWICE DAILY..  . losartan (COZAAR) 50 MG tablet Take 50 mg by mouth daily.  . metFORMIN (GLUCOPHAGE) 500 MG tablet Take 500 mg by mouth 2 (two) times daily with a meal.   . Multiple Vitamin (MULITIVITAMIN WITH MINERALS) TABS Take 1 tablet by mouth daily.  . nitroGLYCERIN (NITROSTAT) 0.4 MG SL tablet Place 0.4 mg under the tongue every 5 (five) minutes as needed for chest pain.   Marland Kitchen NOVOLIN N 100 UNIT/ML injection Inject 40 Units into the skin daily before breakfast.   . NOVOLIN R 100 UNIT/ML injection 10 UNITS THIRTY MINUTES BEFORE MEALS.  5 ADDITIONAL UNITS WITH CARBS OR SNACKS.  [name] VERIO test strip USE TO MONITOR GLUCOSE LEVELS TWICE DAILY  . SYNTHROID 125 MCG tablet Take 125 mcg by mouth every morning.   No facility-administered encounter medications on file as of 03/02/2020.    Functional Status:  In your present state of health, do you have any difficulty performing the following activities: 09/23/2019  Hearing? N  Vision? N  Difficulty concentrating or making decisions? Y  Comment per patient poor STM since she had a stroke  Walking or climbing stairs? N  Dressing or bathing? N  Doing errands, shopping? N  Preparing Food and eating ? N  Using the Toilet? N  In the past six months, have you accidently leaked urine? Y  Comment after my stroke  I have had leaking  Do you have problems with loss of bowel control? N  Managing your Medications? N  Managing your Finances? N  Housekeeping or managing your Housekeeping? N  Some recent data might be hidden    Fall/Depression Screening: Fall Risk  03/02/2020 03/02/2020 09/23/2019  Falls in the past year? 0 0 0  Number falls in past yr: 0 0 0  Injury with Fall? 0 0 0  Risk for fall due to : Impaired balance/gait Impaired balance/gait Impaired balance/gait;Impaired vision  Risk for fall due to:  Comment has dizziness at times has dizziness at times reports double vision and dizziness at times  Follow up Falls prevention discussed;Education provided;Falls evaluation completed Falls prevention discussed;Education provided;Falls evaluation completed Falls prevention discussed;Education provided;Falls evaluation completed   PHQ 2/9 Scores 03/02/2020 09/23/2019 08/25/2019 12/10/2018 10/28/2018 07/31/2017 06/14/2017  PHQ - 2 Score 1 1 0 0 0 0 0  PHQ- 9 Score - - 4 - - - -    Assessment:  Goals Addressed            This Visit's Progress   . Monitor and Manage My Blood Sugar       Follow Up Date 05/07/20    - check blood sugar at prescribed times - check blood sugar if I feel it is too high or too low - enter blood sugar readings and medication or insulin into daily log - take the blood sugar log to all doctor visits - take the blood sugar meter to all doctor visits    Why is this important?   Checking your blood sugar at home helps to keep it from getting very high or very low.  Writing the results in a diary or log helps the doctor know how to care for you.  Your blood sugar log should have the time, date and the results.  Also, write down the amount of insulin or other medicine that you take.  Other information, like what you ate, exercise done and how you were feeling, will also be helpful.     Notes: Patient is checking her blood sugar routinely several times daily.     . Patient Stated       Keep doing the good work with your weight!    . Perform Foot Care       Follow Up Date 05/07/20    - check feet daily for cuts, sores or redness - keep feet up while sitting - trim toenails straight across - wash and dry feet carefully every day - wear comfortable, cotton socks - wear comfortable, well-fitting shoes    Why is this important?   Good foot care is very important when you have diabetes.  There are many things you can do to keep your feet healthy and catch a problem early.     Notes:     . COMPLETED: Set My Target A1C       Follow Up Date 03/02/20   - set target A1C    Why is this important?   Your target A1C is decided together by you and your doctor.  It is based on several things like your age and other health issues.    Notes: Patient states her A1c is 6.2 of which she is very pleased.       Plan: RN Health Coach will send PCP today's assessment note, will send patient Glucerna coupons and insulin injection site rotation education, will call patient within the month of January,  and patient agrees to future outreach calls.   [name] RN, BSN [place] [phone] [email or web address]

## 2020-03-04 DIAGNOSIS — Z7409 Other reduced mobility: Secondary | ICD-10-CM | POA: Diagnosis not present

## 2020-03-04 DIAGNOSIS — G8929 Other chronic pain: Secondary | ICD-10-CM | POA: Diagnosis not present

## 2020-03-04 DIAGNOSIS — I69398 Other sequelae of cerebral infarction: Secondary | ICD-10-CM | POA: Diagnosis not present

## 2020-03-04 DIAGNOSIS — R269 Unspecified abnormalities of gait and mobility: Secondary | ICD-10-CM | POA: Diagnosis not present

## 2020-03-04 DIAGNOSIS — M546 Pain in thoracic spine: Secondary | ICD-10-CM | POA: Diagnosis not present

## 2020-03-04 DIAGNOSIS — R29898 Other symptoms and signs involving the musculoskeletal system: Secondary | ICD-10-CM | POA: Diagnosis not present

## 2020-03-04 DIAGNOSIS — R2689 Other abnormalities of gait and mobility: Secondary | ICD-10-CM | POA: Diagnosis not present

## 2020-03-07 ENCOUNTER — Encounter: Payer: Self-pay | Admitting: Neurology

## 2020-03-07 ENCOUNTER — Ambulatory Visit (INDEPENDENT_AMBULATORY_CARE_PROVIDER_SITE_OTHER): Payer: Medicare Other | Admitting: Neurology

## 2020-03-07 VITALS — BP 115/69 | HR 74 | Ht 65.0 in | Wt 182.0 lb

## 2020-03-07 DIAGNOSIS — I480 Paroxysmal atrial fibrillation: Secondary | ICD-10-CM

## 2020-03-07 DIAGNOSIS — R269 Unspecified abnormalities of gait and mobility: Secondary | ICD-10-CM | POA: Diagnosis not present

## 2020-03-07 DIAGNOSIS — Z8673 Personal history of transient ischemic attack (TIA), and cerebral infarction without residual deficits: Secondary | ICD-10-CM

## 2020-03-07 DIAGNOSIS — I69398 Other sequelae of cerebral infarction: Secondary | ICD-10-CM

## 2020-03-07 DIAGNOSIS — R5383 Other fatigue: Secondary | ICD-10-CM | POA: Diagnosis not present

## 2020-03-07 DIAGNOSIS — I639 Cerebral infarction, unspecified: Secondary | ICD-10-CM

## 2020-03-07 DIAGNOSIS — E889 Metabolic disorder, unspecified: Secondary | ICD-10-CM

## 2020-03-07 DIAGNOSIS — G63 Polyneuropathy in diseases classified elsewhere: Secondary | ICD-10-CM | POA: Diagnosis not present

## 2020-03-07 NOTE — Patient Instructions (Signed)

## 2020-03-07 NOTE — Progress Notes (Signed)
SLEEP MEDICINE CLINIC    Provider:  Larey Seat, MD  Primary Care Physician:  [name], NP [place] [address]     Referring Provider: Tamala Fothergill and [name], NP.         Chief Complaint according to patient   Patient presents with:    . New Patient (Initial Visit)            HISTORY OF PRESENT ILLNESS:  Breanna Webster is a 73 year- old Caucasian female patient and here upon a consultation requested by the stroke service-  on 03/07/2020 .  Chief concern according to patient :   [name] did suffer a stroke in 2020 and this affected the left cerebellum and left cerebrum. This home sleep test was prepared by Golden Hurter electronically signed on 11 Sep 2018.  The patient had first developed a DVT, had a known aortic stenosis post TAVR on 3-20 20, hypothyroidism pulmonary hypertension hypertension diabetes mellitus coronary artery disease with stenting in December 2019, history of infectious endocarditis 10/11/2018 multifocal bilateral anterior posterior infarcts secondary to new onset atrial fibrillation in June 2020.  She developed double vision, imbalance dizziness and left hand shaking as a result.  She states that she had left hemiparesis and her left angle of her mouth was droopy.  It seems that during her hospitalization in May 2020 a research trial took place which involved her into a home sleep test.   This test was never fully interpreted by her physician, but no significant apnea was found based on this home sleep test from 04 Sep 2018 for AHI which stands for apnea hypopnea index was 4.2 and would not be enough to warrant any treatment or intervention. [name] describes that during her hospitalization she just could not sleep and she rate remained with problems of insomnia which were not attributed to apnea.  Over the last weeks or months this has shifted to an increasing degree of fatigue and daytime and she has  been tired enough she stated, that she could sleep standing up in any situation now.  She endorsed today's Epworth Sleepiness Scale at 11 out of 24 points fatigue severity at 34 out of 63 points and geriatric depression scale 4 out of 15 points. Her husband has not noted apnea. Her son has OSA.      I have the pleasure of seeing Breanna Webster today, a right-handed White or Caucasian female without clinical evidence of apnea. may have another  sleep disorder.  She  has a past medical history of Anxiety, Arthritis, Back pain, Blood transfusion without reported diagnosis, CAD (coronary artery disease), Carotid artery stenosis, Chest pain, Chronic lower back pain, Cirrhosis ([place]), Colon polyps, Diverticulitis, Diverticulosis, Esophageal thickening, Fatty liver, GERD (gastroesophageal reflux disease), Grave's disease, History of colonic polyps (05/22/2017), History of hiatal hernia, Hypertension, Hypothyroidism, IBS (irritable bowel syndrome), Osteopenia, Pulmonary nodules, S/P TAVR (transcatheter aortic valve replacement), Severe aortic stenosis, Shortness of breath on exertion, Stroke ([place]), Thalassemia minor, Thyroid disease, and Type II diabetes mellitus ([place]). Thalassemia minor, known anemia.      Sleep relevant medical history: Left weakness. Fatigue- Family medical /sleep history: Son on CPAP with OSA, CAD.   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  She lives in a household with her spouse, adult son.  Pets are  present. Tobacco use: none .  ETOH use; none ,  Caffeine intake in form of Coffee( 2  cups decaffeinated)  Regular exercise in form of walking, PT     Sleep habits are as follows: The patient's dinner time is between 7-8.30 PM. The patient goes to bed at 12 PM and continues to sleep for intervals of 2-3 hours , wakes for 2 bathroom breaks, the first time at 3 AM.   The preferred sleep position is on her right side , with the support of 2 pillows,  head raised because of GERD. Dreams are reportedly frequent/vivid. 10 AM is the usual rise time. The patient wakes up spontaneously at 9 AM.  She reports not feeling refreshed or restored in AM, with symptoms such as dry mouth and post nasal drip-, no morning headaches . There is always residual fatigue.  Naps are taken frequently, lasting from 1-3 hours and are more refreshing than nocturnal sleep.    Review of Systems: Out of a complete 14 system review, the patient complains of only the following symptoms, and all other reviewed systems are negative.:  Fatigue, sleepiness , snoring, fragmented sleep, Insomnia / post stroke.    How likely are you to doze in the following situations: 0 = not likely, 1 = slight chance, 2 = moderate chance, 3 = high chance   Sitting and Reading? Watching Television? Sitting inactive in a public place (theater or meeting)? As a passenger in a car for an hour without a break? Lying down in the afternoon when circumstances permit? Sitting and talking to someone? Sitting quietly after lunch without alcohol? In a car, while stopped for a few minutes in traffic?   Total =11 / 24 points   FSS endorsed at 34 / 63 points.   Social History   Socioeconomic History  . Marital status: Married    Spouse name: Not on file  . Number of children: 2  . Years of education: Not on file  . Highest education level: Not on file  Occupational History  . Occupation: Tree surgeon of the [employer]  Tobacco Use  . Smoking status: Never Smoker  . Smokeless tobacco: Never Used  Vaping Use  . Vaping Use: Never used  Substance and Sexual Activity  . Alcohol use: No    Alcohol/week: 0.0 standard drinks  . Drug use: Never  . Sexual activity: Not Currently  Other Topics Concern  . Not on file  Social History Narrative   Lives with husband in a one story home.     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   Education: college.   Social Determinants of Health   Financial Resource Strain: Low Risk   . Difficulty of Paying Living Expenses: Not very hard  Food Insecurity: No Food Insecurity  . Worried About Charity fundraiser in the Last Year: Never true  . Ran Out of Food in the Last Year: Never true  Transportation Needs: No Transportation Needs  . Lack of Transportation (Medical): No  . Lack of Transportation (Non-Medical): No  Physical Activity: Insufficiently Active  . Days of Exercise per Week: 6 days  . Minutes of Exercise per Session: 20 min  Stress:   . Feeling of Stress : Not on file  Social Connections:   . Frequency of Communication with Friends and Family: Not on file  . Frequency of Social Gatherings with Friends and Family: Not on file  . Attends Religious Services: Not on file  . Active Member of Clubs or Organizations: Not  on file  . Attends Archivist Meetings: Not on file  . Marital Status: Not on file    Family History  Adopted: Yes  Problem Relation Age of Onset  . Healthy Son        x 2  . Headache Other        Cluster headaches  . Heart failure Mother   . Colon cancer Neg Hx   . Pancreatic cancer Neg Hx   . Rectal cancer Neg Hx   . Stomach cancer Neg Hx     Past Medical History:  Diagnosis Date  . Anxiety   . Arthritis    "back" (04/22/2018)  . Back pain   . Blood transfusion without reported diagnosis   . CAD (coronary artery disease)    a. 03/2018 s/p PCI/DES to the RCA (3.0x15 Onyx DES).  . Carotid artery stenosis    Mild  . Chest pain   . Chronic lower back pain   . Cirrhosis ([place])   . Colon polyps   . Diverticulitis   . Diverticulosis   . Esophageal thickening    seen on pre TAVR CT scan, also questionable cirrhosis. MRI recommended. Will refer to GI  . Fatty liver   . GERD (gastroesophageal reflux disease)   . Grave's disease   . History of colonic polyps 05/22/2017  . History of hiatal hernia   .  Hypertension   . Hypothyroidism   . IBS (irritable bowel syndrome)   . Osteopenia   . Pulmonary nodules    seen on pre TAVR CT. likley benign. no follow up recommended if pt low risk.  . S/P TAVR (transcatheter aortic valve replacement)   . Severe aortic stenosis   . Shortness of breath on exertion   . Stroke ([place])   . Thalassemia minor   . Thyroid disease   . Type II diabetes mellitus ([place])     Past Surgical History:  Procedure Laterality Date  . [place] STUDY N/A 03/03/2018   Procedure: [place] STUDY;  Surgeon: Mauri Pole, MD;  Location: [facility];  Service: Endoscopy;  Laterality: N/A;  . COLONOSCOPY    . COLONOSCOPY W/ BIOPSIES AND POLYPECTOMY    . CORONARY ANGIOGRAPHY Right 04/21/2018   Procedure: CORONARY ANGIOGRAPHY (CATH LAB);  Surgeon: [name], MD;  Location: [place] CV LAB;  Service: Cardiovascular;  Laterality: Right;  . CORONARY STENT INTERVENTION N/A 04/22/2018   Procedure: CORONARY STENT INTERVENTION;  Surgeon: [name], MD;  Location: [place] [facility] CV LAB;  Service: Cardiovascular;  Laterality: N/A;  . DILATION AND CURETTAGE OF UTERUS    . ESOPHAGEAL MANOMETRY N/A 03/03/2018   Procedure: ESOPHAGEAL MANOMETRY (EM);  Surgeon: Mauri Pole, MD;  Location: [facility];  Service: Endoscopy;  Laterality: N/A;  . GASTRIC FUNDOPLICATION    . HERNIA REPAIR    . HYSTEROSCOPY     fibroids  . LAPAROSCOPIC CHOLECYSTECTOMY    . LAPAROSCOPY     fibroids  . NISSEN FUNDOPLICATION  8882C  . POLYPECTOMY    . RIGHT/LEFT HEART CATH AND CORONARY ANGIOGRAPHY N/A 02/20/2018   Procedure: RIGHT/LEFT HEART CATH AND CORONARY ANGIOGRAPHY;  Surgeon: [name], MD;  Location: [facility] CV LAB;  Service: Cardiovascular;  Laterality: N/A;  . TEE WITHOUT CARDIOVERSION N/A 07/08/2018   Procedure: TRANSESOPHAGEAL ECHOCARDIOGRAM (TEE);  Surgeon: Burnell Blanks, MD;  Location: [facility];  Service: Open Heart Surgery;  Laterality: N/A;  . TEE  WITHOUT CARDIOVERSION  10/07/2018  . TEE WITHOUT CARDIOVERSION N/A 10/07/2018   Procedure: TRANSESOPHAGEAL ECHOCARDIOGRAM (TEE);  Surgeon: Jerline Pain, MD;  Location: [facility] END[facility];  Service: Cardiovascular;  Laterality: N/A;  . TONSILLECTOMY    . TRANSCATHETER AORTIC VALVE REPLACEMENT, TRANSFEMORAL N/A 07/08/2018   Procedure: TRANSCATHETER AORTIC VALVE REPLACEMENT, TRANSFEMORAL;  Surgeon: Burnell Blanks, MD;  Location: [facility];  Service: Open Heart Surgery;  Laterality: N/A;     Current Outpatient Medications on File Prior to Visit  Medication Sig Dispense Refill  . amitriptyline (ELAVIL) 25 MG tablet TAKE 1 TABLET(25 MG) BY MOUTH AT BEDTIME 90 tablet 1  . BD INSULIN SYRINGE U/F 31G X 5/16" 1 ML MISC INJECT 4 TIMES DAILY SUBCUTANEOUSLY 100 each 3  . Cholecalciferol (VITAMIN D3 PO) Take 1 tablet by mouth daily.    Marland Kitchen diltiazem (CARDIZEM CD) 120 MG 24 hr capsule TAKE 1 CAPSULE(120 MG) BY MOUTH DAILY 90 capsule 1  . ELIQUIS 5 MG TABS tablet TAKE 1 TABLET BY MOUTH TWICE DAILY 180 tablet 1  . Lancets (ONETOUCH DELICA PLUS [id number]) MISC USE TO CHECK BLOOD SUGAR TWICE DAILY.. 200 each 0  . losartan (COZAAR) 50 MG tablet Take 50 mg by mouth daily.    . metFORMIN (GLUCOPHAGE) 500 MG tablet Take 500 mg by mouth 2 (two) times daily with a meal.     . Multiple Vitamin (MULITIVITAMIN WITH MINERALS) TABS Take 1 tablet by mouth daily.    . nitroGLYCERIN (NITROSTAT) 0.4 MG SL tablet Place 0.4 mg under the tongue every 5 (five) minutes as needed for chest pain.     Marland Kitchen NOVOLIN N 100 UNIT/ML injection Inject 40 Units into the skin daily before breakfast.     . NOVOLIN R 100 UNIT/ML injection 10 UNITS THIRTY MINUTES BEFORE MEALS.  5 ADDITIONAL UNITS WITH CARBS OR SNACKS.    [name] VERIO test strip USE TO MONITOR GLUCOSE LEVELS TWICE DAILY 200 strip 3  . SYNTHROID 125 MCG tablet Take 125 mcg by mouth every morning.     No current facility-administered medications on file prior to visit.     Allergies  Allergen Reactions  . Statins Other (See Comments)    Muscle aches    Physical exam:  Today's Vitals   03/07/20 1259  BP: 115/69  Pulse: 74  Weight: 182 lb (82.6 kg)  Height: 5' 5"  (1.651 m)   Body mass index is 30.29 kg/m.   Wt Readings from Last 3 Encounters:  03/07/20 182 lb (82.6 kg)  02/12/20 182 lb 14.4 oz (83 kg)  01/25/20 182 lb (82.6 kg)     Ht Readings from Last 3 Encounters:  03/07/20 5' 5"  (1.651 m)  02/12/20 5' 5"  (1.651 m)  01/25/20 5' 5"  (1.651 m)      General: The patient is awake, alert and appears not in acute distress. The patient is well groomed. Head: Normocephalic, atraumatic. Neck is supple. Mallampati 2,  neck circumference: 16.5  inches .  Nasal airflow patent.  Retrognathia is not seen.  Dental status: intact.  Cardiovascular:  Regular rate and cardiac rhythm by pulse,  without distended neck veins. Respiratory: Lungs are clear to auscultation.  Skin:  Without evidence of ankle edema, or rash. Trunk: The patient's posture is erect.   Neurologic exam : The patient is awake and alert, oriented to place and time.   Memory subjective described as intact.  Attention span & concentration ability appears normal.  Speech is fluent,  without  dysarthria, dysphonia or aphasia.  Mood and affect are appropriate.   Cranial nerves: no loss of smell or taste reported  Pupils are equal and briskly reactive to light. Funduscopic exam .  Extraocular movements in vertical and horizontal planes were intact and without nystagmus. No Diplopia today. Visual fields by finger perimetry are intact. Hearing was intact to soft voice and finger rubbing.    Facial sensation intact to fine touch.  Facial motor strength is symmetric and tongue and uvula move midline.  Neck ROM : rotation, tilt and flexion extension were normal for age and shoulder shrug was symmetrical.    Motor exam:  right biceps tone is normal, her right is waxy- elevated tone.   Notable tremor at rest in the left hand without cog wheeling, symmetric grip strength .   Sensory:  Fine touch, vibration were diminished in both feet, vibration is lost in both ankles.    Proprioception tested in the upper extremities was normal.   Coordination: Rapid alternating movements in the fingers/hands were of normal speed.  The Finger-to-nose maneuver was intact without evidence of ataxia, dysmetria or tremor.   Gait and station: Patient could rise unassisted from a seated position, walked without assistive device.  Stance is of normal width/ base and the patient turned with steps.  Toe and heel walk were deferred.  Deep tendon reflexes: in the  upper and lower extremities are symmetric and intact.  Babinski response was deferred .     Dear Dr. Leonie Man,    After spending a total time of 45  minutes face to face and additional time for physical and neurologic examination, review of laboratory studies,  personal review of imaging studies, reports and results of other testing and review of referral information / records as far as provided in visit, I have established the following assessments:  1)  This patient seems to have risk factors for OSA.  Atrial fibrillation- not present today.  2)   Her main problem is fatigue and insomnia- conflicting information.  3)   She has suffered multifocal embolic strokes- explaining her patchy deficits.    My Plan is to proceed with:  1)  Attended sleep study to see what origin her arousals are.    I would like to thank  Antony Contras, MD  and [name]. for allowing me to meet with  this pleasant patient.  I plan to not follow up should her repeat sleep study be negative.   CC: I will share my notes with Dr. Leonie Man.   Electronically signed by: Larey Seat, MD 03/07/2020 1:09 PM  [facility] Board certified by The [employer] of Sleep Medicine and Diplomate of the [employer] of Sleep  Medicine. Board certified In Neurology through the [facility], Fellow of the [employer] of Neurology. Medical Director of [facility].

## 2020-03-07 NOTE — Telephone Encounter (Signed)
error 

## 2020-03-10 DIAGNOSIS — M546 Pain in thoracic spine: Secondary | ICD-10-CM | POA: Diagnosis not present

## 2020-03-10 DIAGNOSIS — R269 Unspecified abnormalities of gait and mobility: Secondary | ICD-10-CM | POA: Diagnosis not present

## 2020-03-10 DIAGNOSIS — Z7409 Other reduced mobility: Secondary | ICD-10-CM | POA: Diagnosis not present

## 2020-03-10 DIAGNOSIS — G8929 Other chronic pain: Secondary | ICD-10-CM | POA: Diagnosis not present

## 2020-03-10 DIAGNOSIS — I69398 Other sequelae of cerebral infarction: Secondary | ICD-10-CM | POA: Diagnosis not present

## 2020-03-10 DIAGNOSIS — R29898 Other symptoms and signs involving the musculoskeletal system: Secondary | ICD-10-CM | POA: Diagnosis not present

## 2020-03-10 DIAGNOSIS — R2689 Other abnormalities of gait and mobility: Secondary | ICD-10-CM | POA: Diagnosis not present

## 2020-03-11 ENCOUNTER — Other Ambulatory Visit: Payer: Self-pay | Admitting: Endocrinology

## 2020-03-11 NOTE — Telephone Encounter (Signed)
Last OV 11/03/18  please advise

## 2020-03-11 NOTE — Telephone Encounter (Signed)
1.  Please schedule f/u appt 2.  Then please refill x 2 mos, pending that appt.  

## 2020-03-14 ENCOUNTER — Ambulatory Visit (INDEPENDENT_AMBULATORY_CARE_PROVIDER_SITE_OTHER): Payer: Medicare Other

## 2020-03-14 ENCOUNTER — Other Ambulatory Visit: Payer: Self-pay

## 2020-03-14 VITALS — BP 122/62 | HR 73 | Temp 98.2°F | Ht 65.0 in | Wt 182.3 lb

## 2020-03-14 DIAGNOSIS — Z0000 Encounter for general adult medical examination without abnormal findings: Secondary | ICD-10-CM

## 2020-03-14 NOTE — Progress Notes (Signed)
Subjective:   Breanna Webster is a 74 y.o. female who presents for Medicare Annual (Subsequent) preventive examination.  Review of Systems    N/A  Cardiac Risk Factors include: advanced age (>53mn, >>5women);dyslipidemia;diabetes mellitus     Objective:    Today's Vitals   03/14/20 1125  BP: 122/62  Pulse: 73  Temp: 98.2 F (36.8 C)  TempSrc: Oral  SpO2: 96%  Weight: 182 lb 5 oz (82.7 kg)  Height: 5' 5"  (1.651 m)   Body mass index is 30.34 kg/m.  Advanced Directives 03/14/2020 09/23/2019 09/11/2019 04/30/2019 11/17/2018 10/28/2018 10/22/2018  Does Patient Have a Medical Advance Directive? Yes Yes No Yes Yes - -  Type of AParamedicof [place]at[place]Living will [place]Living will - [place]Living will Healthcare Power of APearl BeachLiving will HSouth WilliamsonLiving will  Does patient want to make changes to medical advance directive? No - Patient declined - - No - Patient declined - - No - Patient declined  Copy of HPinecrestin Chart? Yes - validated most recent copy scanned in chart (See row information) Yes - validated most recent copy scanned in chart (See row information) - No - copy requested - - No - copy requested  Would patient like information on creating a medical advance directive? - - No - Patient declined - - - -    Current Medications (verified) Outpatient Encounter Medications as of 03/14/2020  Medication Sig  . amitriptyline (ELAVIL) 25 MG tablet TAKE 1 TABLET(25 MG) BY MOUTH AT BEDTIME  . BD INSULIN SYRINGE U/F 31G X 5/16" 1 ML MISC INJECT 4 TIMES DAILY SUBCUTANEOUSLY  . Cholecalciferol (VITAMIN D3 PO) Take 1 tablet by mouth daily.  .Marland Kitchendiltiazem (CARDIZEM CD) 120 MG 24 hr capsule TAKE 1 CAPSULE(120 MG) BY MOUTH DAILY  . ELIQUIS 5 MG TABS tablet TAKE 1 TABLET BY MOUTH TWICE DAILY  . Lancets (ONETOUCH DELICA PLUS [id number] MISC USE TO CHECK BLOOD  SUGAR TWICE DAILY..  . levothyroxine (SYNTHROID) 88 MCG tablet TAKE 1 TABLET(88 MCG) BY MOUTH DAILY BEFORE BREAKFAST  . losartan (COZAAR) 50 MG tablet Take 50 mg by mouth daily.  . metFORMIN (GLUCOPHAGE) 500 MG tablet Take 500 mg by mouth 2 (two) times daily with a meal.   . Multiple Vitamin (MULITIVITAMIN WITH MINERALS) TABS Take 1 tablet by mouth daily.  .Marland KitchenNOVOLIN N 100 UNIT/ML injection Inject 40 Units into the skin daily before breakfast.   . NOVOLIN R 100 UNIT/ML injection 10 UNITS THIRTY MINUTES BEFORE MEALS.  5 ADDITIONAL UNITS WITH CARBS OR SNACKS.  .Glory RosebushVERIO test strip USE TO MONITOR GLUCOSE LEVELS TWICE DAILY  . SYNTHROID 125 MCG tablet Take 125 mcg by mouth every morning.  . nitroGLYCERIN (NITROSTAT) 0.4 MG SL tablet Place 0.4 mg under the tongue every 5 (five) minutes as needed for chest pain.  (Patient not taking: Reported on 03/14/2020)   No facility-administered encounter medications on file as of 03/14/2020.    Allergies (verified) Statins   History: Past Medical History:  Diagnosis Date  . Anxiety   . Arthritis    "back" (04/22/2018)  . Back pain   . Blood transfusion without reported diagnosis   . CAD (coronary artery disease)    a. 03/2018 s/p PCI/DES to the RCA (3.0x15 Onyx DES).  . Carotid artery stenosis    Mild  . Chest pain   . Chronic lower back pain   .  Cirrhosis ([place])   . Colon polyps   . Diverticulitis   . Diverticulosis   . Esophageal thickening    seen on pre TAVR CT scan, also questionable cirrhosis. MRI recommended. Will refer to GI  . Fatty liver   . GERD (gastroesophageal reflux disease)   . Grave's disease   . History of colonic polyps 05/22/2017  . History of hiatal hernia   . Hypertension   . Hypothyroidism   . IBS (irritable bowel syndrome)   . Osteopenia   . Pulmonary nodules    seen on pre TAVR CT. likley benign. no follow up recommended if pt low risk.  . S/P TAVR (transcatheter aortic valve replacement)   . Severe aortic  stenosis   . Shortness of breath on exertion   . Stroke ([place])   . Thalassemia minor   . Thyroid disease   . Type II diabetes mellitus ([place])    Past Surgical History:  Procedure Laterality Date  . [place] STUDY N/A 03/03/2018   Procedure: [place] STUDY;  Surgeon: Mauri Pole, MD;  Location: [facility];  Service: Endoscopy;  Laterality: N/A;  . COLONOSCOPY    . COLONOSCOPY W/ BIOPSIES AND POLYPECTOMY    . CORONARY ANGIOGRAPHY Right 04/21/2018   Procedure: CORONARY ANGIOGRAPHY (CATH LAB);  Surgeon: [name], MD;  Location: [facility];  Service: Cardiovascular;  Laterality: Right;  . CORONARY STENT INTERVENTION N/A 04/22/2018   Procedure: CORONARY STENT INTERVENTION;  Surgeon: [name], MD;  Location: [facility];  Service: Cardiovascular;  Laterality: N/A;  . DILATION AND CURETTAGE OF UTERUS    . ESOPHAGEAL MANOMETRY N/A 03/03/2018   Procedure: ESOPHAGEAL MANOMETRY (EM);  Surgeon: Mauri Pole, MD;  Location: [facility];  Service: Endoscopy;  Laterality: N/A;  . GASTRIC FUNDOPLICATION    . HERNIA REPAIR    . HYSTEROSCOPY     fibroids  . LAPAROSCOPIC CHOLECYSTECTOMY    . LAPAROSCOPY     fibroids  . NISSEN FUNDOPLICATION  [id number]  . POLYPECTOMY    . RIGHT/LEFT HEART CATH AND CORONARY ANGIOGRAPHY N/A 02/20/2018   Procedure: RIGHT/LEFT HEART CATH AND CORONARY ANGIOGRAPHY;  Surgeon: [name], MD;  Location: [facility];  Service: Cardiovascular;  Laterality: N/A;  . TEE WITHOUT CARDIOVERSION N/A 07/08/2018   Procedure: TRANSESOPHAGEAL ECHOCARDIOGRAM (TEE);  Surgeon: Burnell Blanks, MD;  Location: [facility];  Service: Open Heart Surgery;  Laterality: N/A;  . TEE WITHOUT CARDIOVERSION  10/07/2018  . TEE WITHOUT CARDIOVERSION N/A 10/07/2018   Procedure: TRANSESOPHAGEAL ECHOCARDIOGRAM (TEE);  Surgeon: Jerline Pain, MD;  Location: [facility] ENDOSCOPY;  Service: Cardiovascular;  Laterality: N/A;  . TONSILLECTOMY    . TRANSCATHETER  AORTIC VALVE REPLACEMENT, TRANSFEMORAL N/A 07/08/2018   Procedure: TRANSCATHETER AORTIC VALVE REPLACEMENT, TRANSFEMORAL;  Surgeon: Burnell Blanks, MD;  Location: [facility];  Service: Open Heart Surgery;  Laterality: N/A;   Family History  Adopted: Yes  Problem Relation Age of Onset  . Healthy Son        x 2  . Headache Other        Cluster headaches  . Heart failure Mother   . Colon cancer Neg Hx   . Pancreatic cancer Neg Hx   . Rectal cancer Neg Hx   . Stomach cancer Neg Hx    Social History   Socioeconomic History  . Marital status: Married    Spouse name: Not on file  . Number of children: 2  .  Years of education: Not on file  . Highest education level: Not on file  Occupational History  . Occupation: Tree surgeon of the [employer]  Tobacco Use  . Smoking status: Never Smoker  . Smokeless tobacco: Never Used  Vaping Use  . Vaping Use: Never used  Substance and Sexual Activity  . Alcohol use: No    Alcohol/week: 0.0 standard drinks  . Drug use: Never  . Sexual activity: Not Currently  Other Topics Concern  . Not on file  Social History Narrative   Lives with husband in a one story home.     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   Education: college.   Social Determinants of Health   Financial Resource Strain: Low Risk   . Difficulty of Paying Living Expenses: Not hard at all  Food Insecurity: No Food Insecurity  . Worried About Charity fundraiser in the Last Year: Never true  . Ran Out of Food in the Last Year: Never true  Transportation Needs: No Transportation Needs  . Lack of Transportation (Medical): No  . Lack of Transportation (Non-Medical): No  Physical Activity: Insufficiently Active  . Days of Exercise per Week: 2 days  . Minutes of Exercise per Session: 50 min  Stress:   . Feeling of Stress : Not on file  Social Connections: Socially Integrated  . Frequency  of Communication with Friends and Family: More than three times a week  . Frequency of Social Gatherings with Friends and Family: Once a week  . Attends Religious Services: More than 4 times per year  . Active Member of Clubs or Organizations: Yes  . Attends Archivist Meetings: More than 4 times per year  . Marital Status: Married    Tobacco Counseling Counseling given: Not Answered   Clinical Intake:  Pre-visit preparation completed: Yes  Pain : No/denies pain     Nutritional Risks: None Diabetes: Yes CBG done?: No Did pt. bring in CBG monitor from home?: No  What is the last grade level you completed in school?: College  Diabetic?Yes Nutrition Risk Assessment:  Has the patient had any N/V/D within the last 2 months?  No  Does the patient have any non-healing wounds?  No  Has the patient had any unintentional weight loss or weight gain?  No   Diabetes:  Is the patient diabetic?  Yes  If diabetic, was a CBG obtained today?  No  Did the patient bring in their glucometer from home?  No  How often do you monitor your CBG's? 2-3x per day.   Financial Strains and Diabetes Management:  Are you having any financial strains with the device, your supplies or your medication? No .  Does the patient want to be seen by Chronic Care Management for management of their diabetes?  No  Would the patient like to be referred to a Nutritionist or for Diabetic Management?  No   Diabetic Exams:  Diabetic Eye Exam: Completed 09/07/2019 Diabetic Foot Exam: Overdue, Pt has been advised about the importance in completing this exam. Pt is scheduled for diabetic foot exam on her next in person office visit .   Interpreter Needed?: No  Information entered by :: [place] of Daily Living In your present state of health, do you have any difficulty performing the following activities: 03/14/2020 09/23/2019  Hearing? N N  Vision? N N  Difficulty concentrating or  making decisions? Tempie Donning  Comment has some issue with short term memory per patient poor STM since she had a stroke  Walking or climbing stairs? Y N  Dressing or bathing? N N  Doing errands, shopping? Y N  Preparing Food and eating ? N N  Using the Toilet? N N  In the past six months, have you accidently leaked urine? N Y  Comment - after my stroke I have had leaking  Do you have problems with loss of bowel control? N N  Managing your Medications? N N  Managing your Finances? N N  Housekeeping or managing your Housekeeping? N N  Some recent data might be hidden    Patient Care Team: [name], NP as PCP - General (Family Medicine) [name], MD as PCP - Cardiology (Cardiology) Madelin Rear, MD as Consulting Physician (Endocrinology) Laretta Alstrom, [name], RN as [place], [place], [facility] as Pharmacist (Pharmacist)  Indicate any recent Medical Services you may have received from other than [facility] providers in the past year (date may be approximate).     Assessment:   This is a routine wellness examination for [place].  Hearing/Vision screen  Hearing Screening   125Hz  250Hz  500Hz  1000Hz  2000Hz  3000Hz  4000Hz  6000Hz  8000Hz   Right ear:           Left ear:           Vision Screening Comments: Patient states gets eyes checked bi annually.See's Dr. Delman Cheadle, and [place] [facility]   Dietary issues and exercise activities discussed: Current Exercise Habits: Structured exercise class, Type of exercise: walking;strength training/weights, Time (Minutes): 35, Frequency (Times/Week): 2, Weekly Exercise (Minutes/Week): 70, Intensity: Mild  Goals    . exercise     Start walking 5 days a week     . Monitor and Manage My Blood Sugar     Follow Up Date 05/07/20    - check blood sugar at prescribed times - check blood sugar if I feel it is too high or too low - enter blood sugar readings and medication or insulin into daily log - take the blood sugar log to  all doctor visits - take the blood sugar meter to all doctor visits    Why is this important?   Checking your blood sugar at home helps to keep it from getting very high or very low.  Writing the results in a diary or log helps the doctor know how to care for you.  Your blood sugar log should have the time, date and the results.  Also, write down the amount of insulin or other medicine that you take.  Other information, like what you ate, exercise done and how you were feeling, will also be helpful.     Notes: Patient is checking her blood sugar routinely several times daily.     . Patient Stated     Keep doing the good work with your weight!    . Patient Stated     I do not want to sleep as much as I do.    . Perform Foot Care     Follow Up Date 05/07/20    - check feet daily for cuts, sores or redness - keep feet up while sitting - trim toenails straight across - wash and dry feet carefully every day - wear comfortable, cotton socks - wear comfortable, well-fitting shoes    Why is this important?   Good foot care is very important when  you have diabetes.  There are many things you can do to keep your feet healthy and catch a problem early.    Notes:     . Pharmacy Care Plan     CARE PLAN ENTRY (see longitudinal plan of care for additional care plan information)  Current Barriers:  . Chronic Disease Management support, education, and care coordination needs related to Hypertension, Hyperlipidemia, Diabetes, Atrial Fibrillation, Coronary Artery Disease, Hypothyroidism, Anxiety, and Barrett's esophagus, history of CVA.   Hypertension BP Readings from Last 3 Encounters:  10/05/19 117/63  09/14/19 125/75  09/11/19 132/65   . Pharmacist Clinical Goal(s): o Over the next 90 days, patient will work with PharmD and providers to maintain BP goal <130/80 . Current regimen:   Diltiazem CD 160m, 1 capsule once daily  Losartan 518m1 tablet once daily  . Patient self care  activities - Over the next 90 days, patient will: o Check BP as instructed, document, and provide at future appointment  Hyperlipidemia/ CAD/ History of CVA Lab Results  Component Value Date/Time   LDLCALC 85 10/22/2018 04:45 AM   LDLCALC 91 11/21/2016 11:13 AM   LDLDIRECT 98.0 05/24/2014 01:43 PM   . Pharmacist Clinical Goal(s): o Over the next 90 days, patient will work with PharmD and providers to achieve LDL goal < 70 . Current regimen:  o No medications . Interventions: . How to reduce cholesterol through diet/weight management and physical activity.    . How a diet high in plant sterols (fruits/vegetables/nuts/whole grains/legumes) may reduce your cholesterol.  Encouraged increasing fiber to a daily intake of 10-25g/day  . Recommend obtain blood work for cholesterol levels.  . Patient self care activities - Over the next 90 days, patient will: o Continue working on lifestyle modifications (diet/ exercise).   Diabetes Lab Results  Component Value Date/Time   HGBA1C 7.5 (H) 10/22/2018 04:45 AM   HGBA1C 8.5 (H) 10/05/2018 04:33 AM   . Pharmacist Clinical Goal(s): o Over the next 90 days, patient will work with PharmD and providers to achieve A1c goal <7% . Current regimen:   Metformin 50082m1 tablet BID with a meal   Novolin N, inject 40 units once daily before breakfast  Novolin R, inject 10 units 30 minutes before meals, 5 additional units with carbs or snacks  . Interventions: o We discussed: how to recognize and treat signs of hypoglycemia . Patient self care activities - Over the next 90 days, patient will: o Check blood sugar twice daily, document, and provide at future appointments o Contact provider with any episodes of hypoglycemia  Atrial fibrillation (paroxysmal)  . Pharmacist Clinical Goal(s) o Over the next 90 days, patient will work with PharmD and providers to prevent strokes.  . Current regimen:  o Diltiazem CD 120m37m capsule once daily o apixaban  (Eliquis) 5mg,14mtablet twice daily . Interventions: o We discussed:  monitoring for signs and symptoms for bleeding (coughing up blood, prolonged nose bleeds, black, tarry stools). . Patient self care activities - Over the next 90 days, patient will: o Continue current medications as instructed.   Hypothyroidism TSH  Date Value Ref Range Status  05/11/2019 14.81 (A) 0.41 - 5.90 Final .  Pharmacist Clinical Goal(s) o Over the next 90 days, patient will work with PharmD and providers to achieve TSH: between 0.35 to 4.5uIU/ml. . Current regimen:  o Levothyroxine 125 mcg, 1 tablet once daily  . Interventions: o We discussed:  administration of levothyroxine (30 minutes prior to first meal.) .  Patient self care activities o Patient will follow up with endocrinologist.  o Request recent lab work to be sent to [facility] at [place].   Barrett's esophagus . Pharmacist Clinical Goal(s) o Over the next  days, patient will work with PharmD and providers to minimize stomach pain. . Current regimen:  . Pantoprazole 50m, 1 tablet once daily . Patient self care activities o Patient will continue current medications as directed by gastroenterologist.   Anxiety . Pharmacist Clinical Goal(s) o Over the next 90 days, patient will work with PharmD and providers to minimize anxiety symptoms.  . Current regimen:  o Escitalopram (Lexapro)- 216m 1 tablet once daily  . Patient self care activities o Patient will continue current medication as directed.   Migrine headache . Pharmacist Clinical Goal(s) o Over the next 90 days, patient will work with PharmD and providers to minimize reoccurrences of headaches.  . Current regimen:  o Amitriptyline 2580m0.5 tablet at bedtime  . Patient self care activities o Patient will continue current medications as directed.   Medication management . Pharmacist Clinical Goal(s): o Over the next 90 days, patient will work with PharmD and providers to maintain  optimal medication adherence . Current pharmacy: [facility] . Interventions o Comprehensive medication review performed. o Continue current medication management strategy . Patient self care activities - Over the next 90 days, patient will: o Take medications as prescribed o Report any questions or concerns to PharmD and/or provider(s)  Please see past updates related to this goal by clicking on the "Past Updates" button in the selected goal      . Weight (lb) < 180 lb (81.6 kg)     Go back to weight watchers  Weight watchers has a app themselves  Check out  online nutrition programs as choGumSearch.nld myfhttp://vang.com/it2me72mook for foods with "whole" wheat; bran; oatmeal etc Shot at the farmer's markets in season for fresher choices  Watch for "hydrogenated" on the label of oils which are trans-fats.  Watch for "high fructose corn syrup" in snacks, yogurt or ketchup  Meats have less marbling; bright colored fruits and vegetables;  Canned; dump out liquid and wash vegetables. Be mindful of what we are eating  Portion control is essential to a health weight! Sit down; take a break and enjoy your meal; take smaller bites; put the fork down between bites;  It takes 20 minutes to get full; so check in with your fullness cues and stop eating when you start to fill full             Depression Screen PHQ 2/9 Scores 03/14/2020 03/02/2020 09/23/2019 08/25/2019 12/10/2018 10/28/2018 07/31/2017  PHQ - 2 Score 0 1 1 0 0 0 0  PHQ- 9 Score 0 - - 4 - - -    Fall Risk Fall Risk  03/14/2020 03/02/2020 03/02/2020 09/23/2019 11/18/2018  Falls in the past year? 0 0 0 0 0  Number falls in past yr: 0 0 0 0 -  Injury with Fall? 0 0 0 0 -  Risk for fall due to : Impaired balance/gait Impaired balance/gait Impaired balance/gait Impaired balance/gait;Impaired vision Impaired balance/gait;Impaired vision;Other (Comment)  Risk for fall due to: Comment - has dizziness at times has dizziness at times  reports double vision and dizziness at times recent stroke  Follow up Falls evaluation completed;Falls prevention discussed Falls prevention discussed;Education provided;Falls evaluation completed Falls prevention discussed;Education provided;Falls evaluation completed Falls prevention discussed;Education provided;Falls evaluation completed Falls evaluation completed    Any  stairs in or around the home? No  If so, are there any without handrails? No  Home free of loose throw rugs in walkways, pet beds, electrical cords, etc? Yes  Adequate lighting in your home to reduce risk of falls? Yes   ASSISTIVE DEVICES UTILIZED TO PREVENT FALLS:  Life alert? No  Use of a cane, walker or w/c? No  Grab bars in the bathroom? Yes  Shower chair or bench in shower? Yes  Elevated toilet seat or a handicapped toilet? No   TIMED UP AND GO:  Was the test performed? Yes .  Length of time to ambulate 10 feet: 8 sec.   Gait slow and steady without use of assistive device  Cognitive Function: MMSE - Mini Mental State Exam 12/21/2019 07/31/2017 07/31/2017 05/23/2016  Not completed: - (No Data) (No Data) (No Data)  Orientation to time 5 - - -  Orientation to Place 5 - - -  Registration 3 - - -  Attention/ Calculation 1 - - -  Recall 1 - - -  Language- name 2 objects 2 - - -  Language- repeat 1 - - -  Language- follow 3 step command 3 - - -  Language- read & follow direction 1 - - -  Write a sentence 1 - - -  Copy design 1 - - -  Total score 24 - - -     6CIT Screen 03/14/2020  What Year? 0 points  What month? 0 points  What time? 0 points  Count back from 20 0 points  Months in reverse 0 points  Repeat phrase 4 points  Total Score 4    Immunizations Immunization History  Administered Date(s) Administered  . Fluad Quad(high Dose 65+) 01/20/2019, 01/06/2020  . Hep A / Hep B 06/15/2015, 07/15/2015, 12/27/2015  . Influenza Split 01/16/2012  . Influenza Whole 01/31/2010  . Influenza, High Dose  Seasonal PF 01/28/2014, 12/29/2014, 02/17/2016, 01/21/2018  . Influenza-Unspecified 12/29/2012  . PFIZER SARS-COV-2 Vaccination 05/16/2019, 06/06/2019, 02/06/2020  . Pneumococcal Conjugate-13 06/14/2017  . Pneumococcal Polysaccharide-23 01/28/2012, 01/28/2014  . Pneumococcal-Unspecified 04/24/2015  . Tdap 12/29/2014  . Zoster 02/14/2012  . Zoster Recombinat (Shingrix) 03/13/2017, 05/24/2017    TDAP status: Up to date Flu Vaccine status: Up to date Pneumococcal vaccine status: Up to date Covid-19 vaccine status: Completed vaccines  Qualifies for Shingles Vaccine? Yes   Zostavax completed Yes   Shingrix Completed?: Yes  Screening Tests Health Maintenance  Topic Date Due  . FOOT EXAM  04/08/2019  . HEMOGLOBIN A1C  04/24/2019  . OPHTHALMOLOGY EXAM  09/06/2020  . COLONOSCOPY  02/02/2022  . TETANUS/TDAP  12/28/2024  . INFLUENZA VACCINE  Completed  . DEXA SCAN  Completed  . COVID-19 Vaccine  Completed  . Hepatitis C Screening  Completed  . PNA vac Low Risk Adult  Completed    Health Maintenance  Health Maintenance Due  Topic Date Due  . FOOT EXAM  04/08/2019  . HEMOGLOBIN A1C  04/24/2019    Colorectal cancer screening: Completed 02/03/2019. Repeat every 3 years Mammogram status: Ordered 02/15/2020. Pt provided with contact info and advised to call to schedule appt.  Bone Density status: Completed 04/08/2017. Results reflect: Bone density results: OSTEOPENIA. Repeat every 5 years.  Lung Cancer Screening: (Low Dose CT Chest recommended if Age 32-80 years, 30 pack-year currently smoking OR have quit w/in 15years.) does not qualify.   Lung Cancer Screening Referral: N/A   Additional Screening:  Hepatitis C Screening: does qualify; Completed 06/02/2015  Vision Screening: Recommended annual ophthalmology exams for early detection of glaucoma and other disorders of the eye. Is the patient up to date with their annual eye exam?  Yes  Who is the provider or what is the name  of the office in which the patient attends annual eye exams? Dr. Gould [place] [facility] If pt is not established with a provider, would they like to be referred to a provider to establish care? No .   Dental Screening: Recommended annual dental exams for proper oral hygiene  Community Resource Referral / Chronic Care Management: CRR required this visit?  No   CCM required this visit?  No      Plan:     I have personally reviewed and noted the following in the patient's chart:   . Medical and social history . Use of alcohol, tobacco or illicit drugs  . Current medications and supplements . Functional ability and status . Nutritional status . Physical activity . Advanced directives . List of other physicians . Hospitalizations, surgeries, and ER visits in previous 12 months . Vitals . Screenings to include cognitive, depression, and falls . Referrals and appointments  In addition, I have reviewed and discussed with patient certain preventive protocols, quality metrics, and best practice recommendations. A written personalized care plan for preventive services as well as general preventive health recommendations were provided to patient.     Ofilia Neas, LPN   [id number]   Nurse Notes: None

## 2020-03-14 NOTE — Patient Instructions (Signed)
Ms. Galvis , Thank you for taking time to come for your Medicare Wellness Visit. I appreciate your ongoing commitment to your health goals. Please review the following plan we discussed and let me know if I can assist you in the future.   Screening recommendations/referrals: Colonoscopy: Up to date, next due 02/02/2022 Mammogram: Currently due, keep your appointment in December  Bone Density: Up to date, next due 04/08/2022 Recommended yearly ophthalmology/optometry visit for glaucoma screening and checkup Recommended yearly dental visit for hygiene and checkup  Vaccinations: Influenza vaccine: Up to date, next due fall 2022  Pneumococcal vaccine: Completed series  Tdap vaccine: Up to date, next due 12/28/2024 Shingles vaccine: Completed series     Advanced directives: Copies on file   Conditions/risks identified: None   Next appointment: 03/15/2021 @ 11:15 am with [place] 65 Years and Older, Female Preventive care refers to lifestyle choices and visits with your health care provider that can promote health and wellness. What does preventive care include?  A yearly physical exam. This is also called an annual well check.  Dental exams once or twice a year.  Routine eye exams. Ask your health care provider how often you should have your eyes checked.  Personal lifestyle choices, including:  Daily care of your teeth and gums.  Regular physical activity.  Eating a healthy diet.  Avoiding tobacco and drug use.  Limiting alcohol use.  Practicing safe sex.  Taking low-dose aspirin every day.  Taking vitamin and mineral supplements as recommended by your health care provider. What happens during an annual well check? The services and screenings done by your health care provider during your annual well check will depend on your age, overall health, lifestyle risk factors, and family history of disease. Counseling  Your health  care provider may ask you questions about your:  Alcohol use.  Tobacco use.  Drug use.  Emotional well-being.  Home and relationship well-being.  Sexual activity.  Eating habits.  History of falls.  Memory and ability to understand (cognition).  Work and work Statistician.  Reproductive health. Screening  You may have the following tests or measurements:  Height, weight, and BMI.  Blood pressure.  Lipid and cholesterol levels. These may be checked every 5 years, or more frequently if you are over 62 years old.  Skin check.  Lung cancer screening. You may have this screening every year starting at age 15 if you have a 30-pack-year history of smoking and currently smoke or have quit within the past 15 years.  Fecal occult blood test (FOBT) of the stool. You may have this test every year starting at age 58.  Flexible sigmoidoscopy or colonoscopy. You may have a sigmoidoscopy every 5 years or a colonoscopy every 10 years starting at age 16.  Hepatitis C blood test.  Hepatitis B blood test.  Sexually transmitted disease (STD) testing.  Diabetes screening. This is done by checking your blood sugar (glucose) after you have not eaten for a while (fasting). You may have this done every 1-3 years.  Bone density scan. This is done to screen for osteoporosis. You may have this done starting at age 40.  Mammogram. This may be done every 1-2 years. Talk to your health care provider about how often you should have regular mammograms. Talk with your health care provider about your test results, treatment options, and if necessary, the need for more tests. Vaccines  Your health care provider may recommend certain  vaccines, such as:  Influenza vaccine. This is recommended every year.  Tetanus, diphtheria, and acellular pertussis (Tdap, Td) vaccine. You may need a Td booster every 10 years.  Zoster vaccine. You may need this after age 49.  Pneumococcal 13-valent conjugate  (PCV13) vaccine. One dose is recommended after age 75.  Pneumococcal polysaccharide (PPSV23) vaccine. One dose is recommended after age 69. Talk to your health care provider about which screenings and vaccines you need and how often you need them. This information is not intended to replace advice given to you by your health care provider. Make sure you discuss any questions you have with your health care provider. Document Released: 05/06/2015 Document Revised: 12/28/2015 Document Reviewed: 02/08/2015 Elsevier Interactive Patient Education  2017 [place] Prevention in the Home Falls can cause injuries. They can happen to people of all ages. There are many things you can do to make your home safe and to help prevent falls. What can I do on the outside of my home?  Regularly fix the edges of walkways and driveways and fix any cracks.  Remove anything that might make you trip as you walk through a door, such as a raised step or threshold.  Trim any bushes or trees on the path to your home.  Use bright outdoor lighting.  Clear any walking paths of anything that might make someone trip, such as rocks or tools.  Regularly check to see if handrails are loose or broken. Make sure that both sides of any steps have handrails.  Any raised decks and porches should have guardrails on the edges.  Have any leaves, snow, or ice cleared regularly.  Use sand or salt on walking paths during winter.  Clean up any spills in your garage right away. This includes oil or grease spills. What can I do in the bathroom?  Use night lights.  Install grab bars by the toilet and in the tub and shower. Do not use towel bars as grab bars.  Use non-skid mats or decals in the tub or shower.  If you need to sit down in the shower, use a plastic, non-slip stool.  Keep the floor dry. Clean up any water that spills on the floor as soon as it happens.  Remove soap buildup in the tub or shower  regularly.  Attach bath mats securely with double-sided non-slip rug tape.  Do not have throw rugs and other things on the floor that can make you trip. What can I do in the bedroom?  Use night lights.  Make sure that you have a light by your bed that is easy to reach.  Do not use any sheets or blankets that are too big for your bed. They should not hang down onto the floor.  Have a firm chair that has side arms. You can use this for support while you get dressed.  Do not have throw rugs and other things on the floor that can make you trip. What can I do in the kitchen?  Clean up any spills right away.  Avoid walking on wet floors.  Keep items that you use a lot in easy-to-reach places.  If you need to reach something above you, use a strong step stool that has a grab bar.  Keep electrical cords out of the way.  Do not use floor polish or wax that makes floors slippery. If you must use wax, use non-skid floor wax.  Do not have throw rugs and other things  on the floor that can make you trip. What can I do with my stairs?  Do not leave any items on the stairs.  Make sure that there are handrails on both sides of the stairs and use them. Fix handrails that are broken or loose. Make sure that handrails are as long as the stairways.  Check any carpeting to make sure that it is firmly attached to the stairs. Fix any carpet that is loose or worn.  Avoid having throw rugs at the top or bottom of the stairs. If you do have throw rugs, attach them to the floor with carpet tape.  Make sure that you have a light switch at the top of the stairs and the bottom of the stairs. If you do not have them, ask someone to add them for you. What else can I do to help prevent falls?  Wear shoes that:  Do not have high heels.  Have rubber bottoms.  Are comfortable and fit you well.  Are closed at the toe. Do not wear sandals.  If you use a stepladder:  Make sure that it is fully  opened. Do not climb a closed stepladder.  Make sure that both sides of the stepladder are locked into place.  Ask someone to hold it for you, if possible.  Clearly mark and make sure that you can see:  Any grab bars or handrails.  First and last steps.  Where the edge of each step is.  Use tools that help you move around (mobility aids) if they are needed. These include:  Canes.  Walkers.  Scooters.  Crutches.  Turn on the lights when you go into a dark area. Replace any light bulbs as soon as they burn out.  Set up your furniture so you have a clear path. Avoid moving your furniture around.  If any of your floors are uneven, fix them.  If there are any pets around you, be aware of where they are.  Review your medicines with your doctor. Some medicines can make you feel dizzy. This can increase your chance of falling. Ask your doctor what other things that you can do to help prevent falls. This information is not intended to replace advice given to you by your health care provider. Make sure you discuss any questions you have with your health care provider. Document Released: 02/03/2009 Document Revised: 09/15/2015 Document Reviewed: 05/14/2014 Elsevier Interactive Patient Education  2017 [employer].

## 2020-03-15 DIAGNOSIS — R29898 Other symptoms and signs involving the musculoskeletal system: Secondary | ICD-10-CM | POA: Diagnosis not present

## 2020-03-15 DIAGNOSIS — G8929 Other chronic pain: Secondary | ICD-10-CM | POA: Diagnosis not present

## 2020-03-15 DIAGNOSIS — R269 Unspecified abnormalities of gait and mobility: Secondary | ICD-10-CM | POA: Diagnosis not present

## 2020-03-15 DIAGNOSIS — Z7409 Other reduced mobility: Secondary | ICD-10-CM | POA: Diagnosis not present

## 2020-03-15 DIAGNOSIS — I69398 Other sequelae of cerebral infarction: Secondary | ICD-10-CM | POA: Diagnosis not present

## 2020-03-15 DIAGNOSIS — M546 Pain in thoracic spine: Secondary | ICD-10-CM | POA: Diagnosis not present

## 2020-03-15 DIAGNOSIS — R2689 Other abnormalities of gait and mobility: Secondary | ICD-10-CM | POA: Diagnosis not present

## 2020-03-22 DIAGNOSIS — M546 Pain in thoracic spine: Secondary | ICD-10-CM | POA: Diagnosis not present

## 2020-03-22 DIAGNOSIS — R269 Unspecified abnormalities of gait and mobility: Secondary | ICD-10-CM | POA: Diagnosis not present

## 2020-03-22 DIAGNOSIS — R2689 Other abnormalities of gait and mobility: Secondary | ICD-10-CM | POA: Diagnosis not present

## 2020-03-22 DIAGNOSIS — G8929 Other chronic pain: Secondary | ICD-10-CM | POA: Diagnosis not present

## 2020-03-22 DIAGNOSIS — I69398 Other sequelae of cerebral infarction: Secondary | ICD-10-CM | POA: Diagnosis not present

## 2020-03-22 DIAGNOSIS — Z7409 Other reduced mobility: Secondary | ICD-10-CM | POA: Diagnosis not present

## 2020-03-22 DIAGNOSIS — R29898 Other symptoms and signs involving the musculoskeletal system: Secondary | ICD-10-CM | POA: Diagnosis not present

## 2020-03-23 ENCOUNTER — Other Ambulatory Visit: Payer: Self-pay

## 2020-03-23 ENCOUNTER — Ambulatory Visit (INDEPENDENT_AMBULATORY_CARE_PROVIDER_SITE_OTHER): Payer: Medicare Other | Admitting: Neurology

## 2020-03-23 DIAGNOSIS — G4734 Idiopathic sleep related nonobstructive alveolar hypoventilation: Secondary | ICD-10-CM

## 2020-03-23 DIAGNOSIS — I480 Paroxysmal atrial fibrillation: Secondary | ICD-10-CM

## 2020-03-23 DIAGNOSIS — E889 Metabolic disorder, unspecified: Secondary | ICD-10-CM

## 2020-03-23 DIAGNOSIS — R5383 Other fatigue: Secondary | ICD-10-CM

## 2020-03-23 DIAGNOSIS — G4733 Obstructive sleep apnea (adult) (pediatric): Secondary | ICD-10-CM

## 2020-03-23 DIAGNOSIS — I69398 Other sequelae of cerebral infarction: Secondary | ICD-10-CM

## 2020-03-23 DIAGNOSIS — Z8673 Personal history of transient ischemic attack (TIA), and cerebral infarction without residual deficits: Secondary | ICD-10-CM

## 2020-03-23 DIAGNOSIS — G63 Polyneuropathy in diseases classified elsewhere: Secondary | ICD-10-CM

## 2020-03-23 NOTE — Progress Notes (Signed)
Cardiology Office Note:    Date:  03/24/2020   ID:  Breanna Webster, DOB [birth year], MRN [id number]  PCP:  [name], NP  Cardiologist:  Sinclair Grooms, MD   Referring MD: [name], NP   No chief complaint on file.   History of Present Illness:    Breanna Webster is a 74 y.o. 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   She has chronic dyspnea on exertion.  It has been stable to slightly improving.  We spent significant time discussing diastolic dysfunction which is obvious abnormality noted on her echo.  TAVR valve function was normal in March 2021.  She had a sleep study yesterday.  She says that someone called her this morning and told her that she had sleep apnea.  I look for the report, and it is not complete in the chart by Dr. Brett Fairy.  She generally feels fatigued.  She sleeps a lot during the day.  Past Medical History:  Diagnosis Date  . Anxiety   . Arthritis    "back" (04/22/2018)  . Back pain   . Blood transfusion without reported diagnosis   . CAD (coronary artery disease)    a. 03/2018 s/p PCI/DES to the RCA (3.0x15 Onyx DES).  . Carotid artery stenosis    Mild  . Chest pain   . Chronic lower back pain   . Cirrhosis ([place])   . Colon polyps   . Diverticulitis   . Diverticulosis   . Esophageal thickening    seen on pre TAVR CT scan, also questionable cirrhosis. MRI recommended. Will refer to GI  . Fatty liver   . GERD (gastroesophageal reflux disease)   . Grave's disease   . History of colonic polyps 05/22/2017  . History of hiatal hernia   . Hypertension   . Hypothyroidism   . IBS (irritable bowel syndrome)   . Osteopenia     . Pulmonary nodules    seen on pre TAVR CT. likley benign. no follow up recommended if pt low risk.  . S/P TAVR (transcatheter aortic valve replacement)   . Severe aortic stenosis   . Shortness of breath on exertion   . Stroke ([place])   . Thalassemia minor   . Thyroid disease   . Type II diabetes mellitus ([place])     Past Surgical History:  Procedure Laterality Date  . [place] STUDY N/A 03/03/2018   Procedure: [place] STUDY;  Surgeon: Mauri Pole, MD;  Location: [facility];  Service: Endoscopy;  Laterality: N/A;  . COLONOSCOPY    . COLONOSCOPY W/ BIOPSIES AND POLYPECTOMY    . CORONARY ANGIOGRAPHY Right 04/21/2018   Procedure: CORONARY ANGIOGRAPHY (CATH LAB);  Surgeon: [name], MD;  Location: [facility];  Service: Cardiovascular;  Laterality: Right;  . CORONARY STENT INTERVENTION N/A 04/22/2018   Procedure: CORONARY STENT INTERVENTION;  Surgeon: [name], MD;  Location: [facility];  Service: Cardiovascular;  Laterality: N/A;  . DILATION AND CURETTAGE OF UTERUS    . ESOPHAGEAL MANOMETRY N/A 03/03/2018   Procedure: ESOPHAGEAL MANOMETRY (EM);  Surgeon: Mauri Pole, MD;  Location: [facility];  Service: Endoscopy;  Laterality: N/A;  . GASTRIC  FUNDOPLICATION    . HERNIA REPAIR    . HYSTEROSCOPY     fibroids  . LAPAROSCOPIC CHOLECYSTECTOMY    . LAPAROSCOPY     fibroids  . NISSEN FUNDOPLICATION  6659D  . POLYPECTOMY    . RIGHT/LEFT HEART CATH AND CORONARY ANGIOGRAPHY N/A 02/20/2018   Procedure: RIGHT/LEFT HEART CATH AND CORONARY ANGIOGRAPHY;  Surgeon: [name], MD;  Location: [facility];  Service: Cardiovascular;  Laterality: N/A;  . TEE WITHOUT CARDIOVERSION N/A 07/08/2018   Procedure: TRANSESOPHAGEAL ECHOCARDIOGRAM (TEE);  Surgeon: Burnell Blanks, MD;  Location: [facility];  Service: Open Heart Surgery;  Laterality: N/A;  . TEE WITHOUT CARDIOVERSION  10/07/2018  . TEE WITHOUT CARDIOVERSION N/A 10/07/2018   Procedure:  TRANSESOPHAGEAL ECHOCARDIOGRAM (TEE);  Surgeon: Jerline Pain, MD;  Location: [facility];  Service: Cardiovascular;  Laterality: N/A;  . TONSILLECTOMY    . TRANSCATHETER AORTIC VALVE REPLACEMENT, TRANSFEMORAL N/A 07/08/2018   Procedure: TRANSCATHETER AORTIC VALVE REPLACEMENT, TRANSFEMORAL;  Surgeon: Burnell Blanks, MD;  Location: [facility];  Service: Open Heart Surgery;  Laterality: N/A;    Current Medications: Current Meds  Medication Sig  . amitriptyline (ELAVIL) 25 MG tablet TAKE 1 TABLET(25 MG) BY MOUTH AT BEDTIME  . BD INSULIN SYRINGE U/F 31G X 5/16" 1 ML MISC INJECT 4 TIMES DAILY SUBCUTANEOUSLY  . Cholecalciferol (VITAMIN D3 PO) Take 1 tablet by mouth daily.  Marland Kitchen diltiazem (CARDIZEM CD) 120 MG 24 hr capsule TAKE 1 CAPSULE(120 MG) BY MOUTH DAILY  . ELIQUIS 5 MG TABS tablet TAKE 1 TABLET BY MOUTH TWICE DAILY  . Lancets (ONETOUCH DELICA PLUS [id number]) MISC USE TO CHECK BLOOD SUGAR TWICE DAILY..  . losartan (COZAAR) 50 MG tablet Take 50 mg by mouth daily.  . metFORMIN (GLUCOPHAGE) 500 MG tablet Take 500 mg by mouth 2 (two) times daily with a meal.   . Multiple Vitamin (MULITIVITAMIN WITH MINERALS) TABS Take 1 tablet by mouth daily.  . nitroGLYCERIN (NITROSTAT) 0.4 MG SL tablet Place 0.4 mg under the tongue every 5 (five) minutes as needed for chest pain.   Marland Kitchen NOVOLIN N 100 UNIT/ML injection Inject 40 Units into the skin daily before breakfast.   . NOVOLIN R 100 UNIT/ML injection 10 UNITS THIRTY MINUTES BEFORE MEALS.  5 ADDITIONAL UNITS WITH CARBS OR SNACKS.  [name] VERIO test strip USE TO MONITOR GLUCOSE LEVELS TWICE DAILY  . SYNTHROID 125 MCG tablet Take 125 mcg by mouth every morning.     Allergies:   Statins   Social History   Socioeconomic History  . Marital status: Married    Spouse name: Not on file  . Number of children: 2  . Years of education: Not on file  . Highest education level: Not on file  Occupational History  . Occupation: Tree surgeon of the [employer]  Tobacco Use  . Smoking status: Never Smoker  . Smokeless tobacco: Never Used  Vaping Use  . Vaping Use: Never used  Substance and Sexual Activity  . Alcohol use: No    Alcohol/week: 0.0 standard drinks  . Drug use: Never  . Sexual activity: Not Currently  Other Topics Concern  . Not on file  Social History Narrative   Lives with husband in a one story home.     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   Education: college.   Social Determinants of Health   Financial Resource Strain: Low Risk   .  Difficulty of Paying Living Expenses: Not hard at all  Food Insecurity: No Food Insecurity  . Worried About Charity fundraiser in the Last Year: Never true  . Ran Out of Food in the Last Year: Never true  Transportation Needs: No Transportation Needs  . Lack of Transportation (Medical): No  . Lack of Transportation (Non-Medical): No  Physical Activity: Insufficiently Active  . Days of Exercise per Week: 2 days  . Minutes of Exercise per Session: 50 min  Stress:   . Feeling of Stress : Not on file  Social Connections: Socially Integrated  . Frequency of Communication with Friends and Family: More than three times a week  . Frequency of Social Gatherings with Friends and Family: Once a week  . Attends Religious Services: More than 4 times per year  . Active Member of Clubs or Organizations: Yes  . Attends Archivist Meetings: More than 4 times per year  . Marital Status: Married     Family History: The patient's family history includes Headache in an other family member; Healthy in her son; Heart failure in her mother. There is no history of Colon cancer, Pancreatic cancer, Rectal cancer, or Stomach cancer. She was adopted.  ROS:   Please see the history of present illness.    Fatigue, dizziness, very concerned that she cannot drive.  She has not driven since her cerebellar stroke.  The  strokes were embolic from endocarditis of the mitral valve.  All other systems reviewed and are negative.  EKGs/Labs/Other Studies Reviewed:    The following studies were reviewed today: 2 D Doppler ECHOCARDIOGRAM 3/ 2021: IMPRESSIONS    1. Left ventricular ejection fraction, by estimation, is 60 to 65%. The  left ventricle has normal function. The left ventricle has no regional  wall motion abnormalities. There is mild left ventricular hypertrophy.  Left ventricular diastolic parameters  are consistent with Grade II diastolic dysfunction (pseudonormalization).  2. Right ventricular systolic function is normal. The right ventricular  size is normal.  3. Left atrial size was moderately dilated.  4. The mitral valve is grossly normal. No evidence of mitral valve  regurgitation.  5. The TAVR is not well visualized. There is no PVL or AI. The mean AV  gradient is normal at 13 mmHg. No significant changes . Marland Kitchen The aortic valve  has been repaired/replaced. Aortic valve regurgitation is not visualized.   EKG:  EKG no new data  Recent Labs: 05/11/2019: TSH 14.81 02/12/2020: ALT 39; BUN 14; Creatinine 0.77; Hemoglobin 10.3; Platelets 200; Potassium 4.2; Sodium 136  Recent Lipid Panel    Component Value Date/Time   CHOL 128 10/22/2018 0445   CHOL 156 11/21/2016 1113   TRIG 95 10/22/2018 0445   HDL 24 (L) 10/22/2018 0445   HDL 44 11/21/2016 1113   CHOLHDL 5.3 10/22/2018 0445   VLDL 19 10/22/2018 0445   LDLCALC 85 10/22/2018 0445   LDLCALC 91 11/21/2016 1113   LDLDIRECT 98.0 05/24/2014 1343    Physical Exam:    VS:  BP 110/60   Pulse 77   Ht 5' 5"  (1.651 m)   Wt 180 lb 14.2 oz (82.1 kg)   SpO2 97%   BMI 30.10 kg/m     Wt Readings from Last 3 Encounters:  03/24/20 180 lb 14.2 oz (82.1 kg)  03/14/20 182 lb 5 oz (82.7 kg)  03/07/20 182 lb (82.6 kg)     GEN: Mildly overweight. No acute distress HEENT: Normal NECK: No JVD.  LYMPHATICS: No lymphadenopathy CARDIAC: 1/6  to 2/6 crescendo decrescendo systolic murmur RRR without diastolic murmur, gallop, or edema. VASCULAR:  Normal Pulses. No bruits. RESPIRATORY:  Clear to auscultation without rales, wheezing or rhonchi  ABDOMEN: Soft, non-tender, non-distended, No pulsatile mass, MUSCULOSKELETAL: No deformity  SKIN: Warm and dry NEUROLOGIC:  Alert and oriented x 3 PSYCHIATRIC:  Normal affect   ASSESSMENT:    1. S/P TAVR (transcatheter aortic valve replacement)   2. Coronary artery disease involving native coronary artery of native heart without angina pectoris   3. Essential hypertension   4. Cerebellar stroke, acute ([place])   5. PAF (paroxysmal atrial fibrillation) ([place])   6. Left bundle branch block   7. DOE (dyspnea on exertion)   8. Type 2 diabetes mellitus without complication, without long-term current use of insulin ([place])   9. Educated about COVID-19 virus infection   10. Sleep apnea, unspecified type    PLAN:    In order of problems listed above:  1. Normally functioning TAVR valve 2. Secondary prevention discussed 3. Target blood pressure 130/80 mmHg or less.  Continue losartan and diltiazem. 4. Improved function 5. Continue Eliquis 5 mg twice daily.  Not currently in atrial fibrillation based upon auscultation. 6. Not assessed by EKG 7. This is due to diastolic heart failure.  Diastolic heart failure was discussed with the patient and husband.  Medications to slow the heart rate, improve diastolic function, and at times volume removal with diuretics help to control dyspnea. 8. Consider SGLT2 therapy as this may have significant impact on diastolic dysfunction. 61. Vaccinated and following practicing mitigation. 10. Awaiting results of the recently performed sleep study.   Overall education and awareness concerning secondary risk prevention was discussed in detail: LDL less than 70, hemoglobin A1c less than 7, blood pressure target less than 130/80 mmHg, >150 minutes of moderate aerobic  activity per week, avoidance of smoking, weight control (via diet and exercise), and continued surveillance/management of/for obstructive sleep apnea.  Follow-up in 1 year and also perform an echocardiogram in front of the office visit.   Medication Adjustments/Labs and Tests Ordered: Current medicines are reviewed at length with the patient today.  Concerns regarding medicines are outlined above.  Orders Placed This Encounter  Procedures  . ECHOCARDIOGRAM COMPLETE   No orders of the defined types were placed in this encounter.   Patient Instructions  Medication Instructions:  Your physician recommends that you continue on your current medications as directed. Please refer to the Current Medication list given to you today.  *If you need a refill on your cardiac medications before your next appointment, please call your pharmacy*   Lab Work: None If you have labs (blood work) drawn today and your tests are completely normal, you will receive your results only by: Marland Kitchen MyChart Message (if you have MyChart) OR . A paper copy in the mail If you have any lab test that is abnormal or we need to change your treatment, we will call you to review the results.   Testing/Procedures: Your physician has requested that you have an echocardiogram 1-2 weeks prior to seeing Dr. Tamala Julian. Echocardiography is a painless test that uses sound waves to create images of your heart. It provides your doctor with information about the size and shape of your heart and how well your heart's chambers and valves are working. This procedure takes approximately one hour. There are no restrictions for this procedure.   Follow-Up: At [facility], you and your health needs are  our priority.  As part of our continuing mission to provide you with exceptional heart care, we have created designated Provider Care Teams.  These Care Teams include your primary Cardiologist (physician) and Advanced Practice Providers (APPs -   Physician Assistants and Nurse Practitioners) who all work together to provide you with the care you need, when you need it.  We recommend signing up for the patient portal called "MyChart".  Sign up information is provided on this After Visit Summary.  MyChart is used to connect with patients for Virtual Visits (Telemedicine).  Patients are able to view lab/test results, encounter notes, upcoming appointments, etc.  Non-urgent messages can be sent to your provider as well.   To learn more about what you can do with MyChart, go to [email or web address].    Your next appointment:   1 year(s)  The format for your next appointment:   In Person  Provider:   You may see Sinclair Grooms, MD or one of the following Advanced Practice Providers on your designated Care Team:    Truitt Merle, NP  Cecilie Kicks, NP  Kathyrn Drown, NP    Other Instructions      Signed, Sinclair Grooms, MD  03/24/2020 2:16 PM    [place]

## 2020-03-24 ENCOUNTER — Ambulatory Visit (INDEPENDENT_AMBULATORY_CARE_PROVIDER_SITE_OTHER): Payer: Medicare Other | Admitting: Interventional Cardiology

## 2020-03-24 ENCOUNTER — Encounter: Payer: Self-pay | Admitting: Interventional Cardiology

## 2020-03-24 ENCOUNTER — Telehealth: Payer: Self-pay | Admitting: *Deleted

## 2020-03-24 VITALS — BP 110/60 | HR 77 | Ht 65.0 in | Wt 180.9 lb

## 2020-03-24 DIAGNOSIS — Z7189 Other specified counseling: Secondary | ICD-10-CM | POA: Diagnosis not present

## 2020-03-24 DIAGNOSIS — I10 Essential (primary) hypertension: Secondary | ICD-10-CM

## 2020-03-24 DIAGNOSIS — G4730 Sleep apnea, unspecified: Secondary | ICD-10-CM

## 2020-03-24 DIAGNOSIS — E119 Type 2 diabetes mellitus without complications: Secondary | ICD-10-CM

## 2020-03-24 DIAGNOSIS — I639 Cerebral infarction, unspecified: Secondary | ICD-10-CM | POA: Diagnosis not present

## 2020-03-24 DIAGNOSIS — I2510 Atherosclerotic heart disease of native coronary artery without angina pectoris: Secondary | ICD-10-CM | POA: Diagnosis not present

## 2020-03-24 DIAGNOSIS — I447 Left bundle-branch block, unspecified: Secondary | ICD-10-CM | POA: Diagnosis not present

## 2020-03-24 DIAGNOSIS — Z952 Presence of prosthetic heart valve: Secondary | ICD-10-CM

## 2020-03-24 DIAGNOSIS — I480 Paroxysmal atrial fibrillation: Secondary | ICD-10-CM

## 2020-03-24 DIAGNOSIS — R0600 Dyspnea, unspecified: Secondary | ICD-10-CM

## 2020-03-24 DIAGNOSIS — R0609 Other forms of dyspnea: Secondary | ICD-10-CM

## 2020-03-24 NOTE — Patient Instructions (Addendum)
Medication Instructions:  Your physician recommends that you continue on your current medications as directed. Please refer to the Current Medication list given to you today.  *If you need a refill on your cardiac medications before your next appointment, please call your pharmacy*   Lab Work: None If you have labs (blood work) drawn today and your tests are completely normal, you will receive your results only by: Marland Kitchen MyChart Message (if you have MyChart) OR . A paper copy in the mail If you have any lab test that is abnormal or we need to change your treatment, we will call you to review the results.   Testing/Procedures: Your physician has requested that you have an echocardiogram 1-2 weeks prior to seeing Dr. Tamala Julian. Echocardiography is a painless test that uses sound waves to create images of your heart. It provides your doctor with information about the size and shape of your heart and how well your heart's chambers and valves are working. This procedure takes approximately one hour. There are no restrictions for this procedure.   Follow-Up: At [facility], you and your health needs are our priority.  As part of our continuing mission to provide you with exceptional heart care, we have created designated Provider Care Teams.  These Care Teams include your primary Cardiologist (physician) and Advanced Practice Providers (APPs -  Physician Assistants and Nurse Practitioners) who all work together to provide you with the care you need, when you need it.  We recommend signing up for the patient portal called "MyChart".  Sign up information is provided on this After Visit Summary.  MyChart is used to connect with patients for Virtual Visits (Telemedicine).  Patients are able to view lab/test results, encounter notes, upcoming appointments, etc.  Non-urgent messages can be sent to your provider as well.   To learn more about what you can do with MyChart, go to [email or web address].    Your  next appointment:   1 year(s)  The format for your next appointment:   In Person  Provider:   You may see Sinclair Grooms, MD or one of the following Advanced Practice Providers on your designated Care Team:    Truitt Merle, NP  Cecilie Kicks, NP  Kathyrn Drown, NP    Other Instructions

## 2020-03-24 NOTE — Telephone Encounter (Signed)
Per request via Mychart message from the pts husband, [facility] was removed from the pharmacy preferences.

## 2020-03-29 DIAGNOSIS — Z8673 Personal history of transient ischemic attack (TIA), and cerebral infarction without residual deficits: Secondary | ICD-10-CM | POA: Insufficient documentation

## 2020-03-29 DIAGNOSIS — R269 Unspecified abnormalities of gait and mobility: Secondary | ICD-10-CM | POA: Insufficient documentation

## 2020-03-29 DIAGNOSIS — I69398 Other sequelae of cerebral infarction: Secondary | ICD-10-CM | POA: Insufficient documentation

## 2020-03-29 DIAGNOSIS — G63 Polyneuropathy in diseases classified elsewhere: Secondary | ICD-10-CM | POA: Insufficient documentation

## 2020-03-29 DIAGNOSIS — E889 Metabolic disorder, unspecified: Secondary | ICD-10-CM | POA: Insufficient documentation

## 2020-03-29 HISTORY — DX: Personal history of transient ischemic attack (TIA), and cerebral infarction without residual deficits: Z86.73

## 2020-03-29 MED ORDER — CLONAZEPAM 0.25 MG PO TBDP
0.2500 mg | ORAL_TABLET | Freq: Every evening | ORAL | 1 refills | Status: DC | PRN
Start: 2020-03-29 — End: 2020-06-22

## 2020-03-29 NOTE — Addendum Note (Signed)
Addended by: Larey Seat on: 03/29/2020 05:36 PM   Modules accepted: Orders

## 2020-03-29 NOTE — Procedures (Signed)
PATIENT'S NAME:  Breanna Webster, Locurto DOB:      May 20, 1945      MR#:    [id number]     DATE OF RECORDING: 03/23/2020 REFERRING M.D.:  Antony Contras, MD Study Performed:   Baseline Polysomnogram HISTORY:  Breanna Webster is a 74- year- old Caucasian female patient who did suffer a stroke in 2020 and this affected the left cerebellum and left cerebrum. 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  She developed double vision, imbalance dizziness and left hand shaking as a result.  She states that she had left hemiparesis and her left angle of her mouth was droopy.   It seems that during her hospitalization in May 2020 a HST took place which was never fully explained by her physician, but no significant apnea was found. A home sleep test was prepared by cardiologist Golden Hurter and electronically signed on 11 Sep 2018.  Based on the data on this home sleep test from 04 Sep 2018 for AHI which stands for apnea hypopnea index was 4.2/h and would not be enough to warrant any treatment or intervention. Mrs. Holliman describes that during her hospitalization she just could not sleep at all and she has remained with problems of insomnia which were not attributed to apnea.   Over the last weeks or months this has shifted to an increasing degree of fatigue and daytime and she has been tired enough, she stated, that she "could sleep standing up in any situation" now.   She endorsed today's Epworth Sleepiness Scale at 11 out of 24 points fatigue severity at 34 out of 63 points and geriatric depression scale 4 out of 15 points. The patient's weight 182 pounds with a height of 65 (inches), resulting in a BMI of 30.5  kg/m2. The patient's neck circumference measured 16.5 inches.  CURRENT MEDICATIONS: Elavil, Vitamin D3, Cardizem, Eliquis, Cozaar, Glucophage, Multivitamins, Nitrostat, Novolin, Synthroid   PROCEDURE:  This is a multichannel digital polysomnogram utilizing the Somnostar 11.2 system.  Electrodes and sensors were applied and monitored per AASM Specifications.   EEG, EOG, Chin and Limb EMG, were sampled at 200 Hz.  ECG, Snore and Nasal Pressure, Thermal Airflow, Respiratory Effort, CPAP Flow and Pressure, Oximetry was sampled at 50 Hz. Digital video and audio were recorded.      BASELINE STUDY: Lights Out was at 00:03 and Lights On at 07:09.  Total recording time (TRT) was 426 minutes, with a total sleep time (TST) of 271.5 minutes.    The patient's sleep latency was 38 minutes. REM latency was 300 minutes. The sleep efficiency was poor at 63.7 %.     SLEEP ARCHITECTURE: WASO (Wake after sleep onset) was 116 minutes.  There were 43 minutes in Stage N1, 177.5 minutes Stage N2, 2 minutes Stage N3 and 49 minutes in Stage REM.  The percentage of Stage N1 was 15.8%, Stage N2 was 65.4%, Stage N3 was 0.7% and Stage R (REM sleep) was 18.0%.     RESPIRATORY ANALYSIS:  There were a total of 58 respiratory events:  10 obstructive apneas, 0 central apneas and 1 mixed apnea with 47 hypopneas with a hypopnea index of 10.4 /hour.     The total APNEA/HYPOPNEA INDEX (AHI) was 12.8/hour. 7 events occurred in  REM sleep and 93 events in NREM. The REM AHI was 8.6 /hour, versus a non-REM AHI of 13.8.  The patient spent 6 minutes of total sleep time in the supine position and 266 minutes in non-supine.  The supine AHI was 100.0 /h versus a non-supine AHI of 10.8/h.  OXYGEN SATURATION & C02:  The Wake baseline 02 saturation was 90%, with the lowest being 76%. Time spent below 89% saturation equaled 70 minutes. The patient remained chronically oxygen desaturated during REM sleep.   The arousals were noted as: 58 were  spontaneous, 6 were associated with PLMs, 37 were associated with respiratory events.  The patient had a total of 54 Periodic Limb Movements.  The Periodic Limb Movement (PLM) Arousal index was 1.3/hour. Audio and video analysis did not show any abnormal or unusual movements, behaviors, phonations or vocalizations.  Snoring was noted. EKG was in keeping with regular rhythm (NSR).  IMPRESSION: There is mild - moderate Obstructive Sleep Apnea (OSA) present which is reflected in an APNEA/HYPOPNEA INDEX (AHI) of 12.8/hour. 1. Sleep architecture was severely altered, highly fragmented sleep. Dysfunctions associated with sleep stages or arousal from sleep. 2. Delayed onset of REM sleep was noted, REM sleep was associated with continuous low oxygen saturation while sleeping on the left side.  Total 02 desaturation time was 70 minutes, SpO2 nadir was 76%.  3. Mild Periodic Limb Movement Disorder (PLMD).  RECOMMENDATIONS:  1. Advise full-night, attended, CPAP titration study to optimize therapy. I would like to use a nasal pillow or cradle for first trial.  2. Patient may need additional oxygen once apnea is controlled to less than 5/h.  3. I would like for the patient to have a sleep aid available for the future study- Xanax 0.25 mg , 14 tablets will be ordered to allow her to sleep in the lab and getting used to PAP at home     I certify that I have reviewed the entire raw data recording prior to the issuance of this report in accordance with the Standards of Accreditation of the American Academy of Sleep Medicine (AASM)  Larey Seat, MD Medical Director, [facility], [place] of Neurology and Sleep Medicine (Neurology and Sleep Medicine)

## 2020-03-30 ENCOUNTER — Encounter: Payer: Self-pay | Admitting: Neurology

## 2020-03-30 ENCOUNTER — Telehealth: Payer: Self-pay | Admitting: Neurology

## 2020-03-30 DIAGNOSIS — L821 Other seborrheic keratosis: Secondary | ICD-10-CM | POA: Diagnosis not present

## 2020-03-30 DIAGNOSIS — L578 Other skin changes due to chronic exposure to nonionizing radiation: Secondary | ICD-10-CM | POA: Diagnosis not present

## 2020-03-30 DIAGNOSIS — D225 Melanocytic nevi of trunk: Secondary | ICD-10-CM | POA: Diagnosis not present

## 2020-03-30 NOTE — Telephone Encounter (Signed)
Called patient to discuss sleep study results. No answer at this time. LVM for the patient to call back.   

## 2020-03-30 NOTE — Telephone Encounter (Signed)
-----   Message from Larey Seat, MD sent at 03/29/2020  5:36 PM EST ----- There is mild - moderate Obstructive Sleep Apnea  (OSA) present which is reflected in an APNEA/HYPOPNEA INDEX (AHI)  of 12.8/hour.  1. Sleep architecture was severely altered, highly fragmented  sleep. Dysfunctions associated with sleep stages or arousal from  sleep.  2. Delayed onset of REM sleep was noted, REM sleep was associated  with continuous low oxygen saturation while sleeping on the left  side. Total 02 desaturation time was 70 minutes, SpO2 nadir was  76%.  3. Mild Periodic Limb Movement Disorder (PLMD).   RECOMMENDATIONS:   1. Advise full-night, attended, CPAP titration study to optimize  therapy. I would like to use a nasal pillow or cradle for first  trial.  2. Patient may need additional oxygen once apnea is controlled to  less than 5/h.  3. I would like for the patient to have a sleep aid available for  the future study- Xanax 0.25 mg , 14 tablets will be ordered to  allow her to sleep in the lab and getting used to PAP at home

## 2020-03-31 ENCOUNTER — Telehealth: Payer: Self-pay | Admitting: Neurology

## 2020-03-31 NOTE — Telephone Encounter (Signed)
Called the patient. Advised that this medication was only sent to be used in the sleep clinic only if she needed. She was very concerned about using the medication. There was 24 tablets sent in to the pharmacy but the patient does not want this. I advised that she should bring to sleep lab with her and only take one if she has difficulty with falling asleep. Informed her that this is not meant to be a regular thing. She has already picked the medication up and the pharmacy will not take it back. I informed her to set it back in case it is needed for future. Educated the patient on it once more and advised to be on the lookout for the sleep lab to call her to schedule for titration study. Pt verbalized understanding.

## 2020-03-31 NOTE — Telephone Encounter (Signed)
Pt called wanting to speak to the Provider or RN about the clonazePAM (KLONOPIN) 0.25 MG disintegrating tablet Please advise.

## 2020-04-04 ENCOUNTER — Other Ambulatory Visit: Payer: Self-pay

## 2020-04-04 ENCOUNTER — Ambulatory Visit
Admission: RE | Admit: 2020-04-04 | Discharge: 2020-04-04 | Disposition: A | Discharge status: Home / Self Care | Payer: Medicare Other | Source: Ambulatory Visit | Attending: Obstetrics | Admitting: Obstetrics

## 2020-04-04 DIAGNOSIS — Z1231 Encounter for screening mammogram for malignant neoplasm of breast: Secondary | ICD-10-CM | POA: Diagnosis not present

## 2020-04-05 DIAGNOSIS — I69398 Other sequelae of cerebral infarction: Secondary | ICD-10-CM | POA: Diagnosis not present

## 2020-04-05 DIAGNOSIS — G8929 Other chronic pain: Secondary | ICD-10-CM | POA: Diagnosis not present

## 2020-04-05 DIAGNOSIS — R2689 Other abnormalities of gait and mobility: Secondary | ICD-10-CM | POA: Diagnosis not present

## 2020-04-05 DIAGNOSIS — R29898 Other symptoms and signs involving the musculoskeletal system: Secondary | ICD-10-CM | POA: Diagnosis not present

## 2020-04-05 DIAGNOSIS — M546 Pain in thoracic spine: Secondary | ICD-10-CM | POA: Diagnosis not present

## 2020-04-05 DIAGNOSIS — Z7409 Other reduced mobility: Secondary | ICD-10-CM | POA: Diagnosis not present

## 2020-04-05 DIAGNOSIS — R269 Unspecified abnormalities of gait and mobility: Secondary | ICD-10-CM | POA: Diagnosis not present

## 2020-04-07 DIAGNOSIS — R269 Unspecified abnormalities of gait and mobility: Secondary | ICD-10-CM | POA: Diagnosis not present

## 2020-04-07 DIAGNOSIS — M546 Pain in thoracic spine: Secondary | ICD-10-CM | POA: Diagnosis not present

## 2020-04-07 DIAGNOSIS — R29898 Other symptoms and signs involving the musculoskeletal system: Secondary | ICD-10-CM | POA: Diagnosis not present

## 2020-04-07 DIAGNOSIS — Z7409 Other reduced mobility: Secondary | ICD-10-CM | POA: Diagnosis not present

## 2020-04-07 DIAGNOSIS — I69398 Other sequelae of cerebral infarction: Secondary | ICD-10-CM | POA: Diagnosis not present

## 2020-04-07 DIAGNOSIS — R2689 Other abnormalities of gait and mobility: Secondary | ICD-10-CM | POA: Diagnosis not present

## 2020-04-07 DIAGNOSIS — G8929 Other chronic pain: Secondary | ICD-10-CM | POA: Diagnosis not present

## 2020-04-11 ENCOUNTER — Telehealth: Payer: Self-pay | Admitting: Neurology

## 2020-04-11 NOTE — Telephone Encounter (Signed)
Pt has concerns about taking clonazePAM (KLONOPIN) 0.25 MG disintegrating tablet, pt would like to know if she can take something else and still get a good sleep study reading.

## 2020-04-11 NOTE — Telephone Encounter (Signed)
I called the patient. She does not wish to take the clonazepam for sleep. I indicated that it is safe to take in this dose with her current medication.  The patient is to take the medication before the test procedure to ensure that she can get some sleep.

## 2020-04-19 ENCOUNTER — Other Ambulatory Visit: Payer: Self-pay

## 2020-04-19 ENCOUNTER — Encounter (HOSPITAL_COMMUNITY): Payer: Self-pay | Admitting: Emergency Medicine

## 2020-04-19 ENCOUNTER — Emergency Department (HOSPITAL_COMMUNITY)
Admission: EM | Admit: 2020-04-19 | Discharge: 2020-04-20 | Disposition: A | Discharge status: Home / Self Care | Payer: Medicare Other | Attending: Emergency Medicine | Admitting: Emergency Medicine

## 2020-04-19 ENCOUNTER — Emergency Department (HOSPITAL_COMMUNITY): Payer: Medicare Other

## 2020-04-19 DIAGNOSIS — H547 Unspecified visual loss: Secondary | ICD-10-CM | POA: Diagnosis not present

## 2020-04-19 DIAGNOSIS — R269 Unspecified abnormalities of gait and mobility: Secondary | ICD-10-CM | POA: Diagnosis not present

## 2020-04-19 DIAGNOSIS — R29898 Other symptoms and signs involving the musculoskeletal system: Secondary | ICD-10-CM | POA: Diagnosis not present

## 2020-04-19 DIAGNOSIS — H54415A Blindness right eye category 5, normal vision left eye: Secondary | ICD-10-CM | POA: Diagnosis not present

## 2020-04-19 DIAGNOSIS — Z5321 Procedure and treatment not carried out due to patient leaving prior to being seen by health care provider: Secondary | ICD-10-CM | POA: Insufficient documentation

## 2020-04-19 DIAGNOSIS — M546 Pain in thoracic spine: Secondary | ICD-10-CM | POA: Diagnosis not present

## 2020-04-19 DIAGNOSIS — Z7409 Other reduced mobility: Secondary | ICD-10-CM | POA: Diagnosis not present

## 2020-04-19 DIAGNOSIS — G8929 Other chronic pain: Secondary | ICD-10-CM | POA: Diagnosis not present

## 2020-04-19 DIAGNOSIS — I69398 Other sequelae of cerebral infarction: Secondary | ICD-10-CM | POA: Diagnosis not present

## 2020-04-19 DIAGNOSIS — I6782 Cerebral ischemia: Secondary | ICD-10-CM | POA: Diagnosis not present

## 2020-04-19 DIAGNOSIS — I700 Atherosclerosis of aorta: Secondary | ICD-10-CM | POA: Diagnosis not present

## 2020-04-19 DIAGNOSIS — R2689 Other abnormalities of gait and mobility: Secondary | ICD-10-CM | POA: Diagnosis not present

## 2020-04-19 LAB — DIFFERENTIAL
Abs Immature Granulocytes: 0.03 10*3/uL (ref 0.00–0.07)
Basophils Absolute: 0.1 10*3/uL (ref 0.0–0.1)
Basophils Relative: 1 %
Eosinophils Absolute: 0.1 10*3/uL (ref 0.0–0.5)
Eosinophils Relative: 2 %
Immature Granulocytes: 0 %
Lymphocytes Relative: 29 %
Lymphs Abs: 2.2 10*3/uL (ref 0.7–4.0)
Monocytes Absolute: 0.5 10*3/uL (ref 0.1–1.0)
Monocytes Relative: 7 %
Neutro Abs: 4.6 10*3/uL (ref 1.7–7.7)
Neutrophils Relative %: 61 %

## 2020-04-19 LAB — CBC
HCT: 36.7 % (ref 36.0–46.0)
Hemoglobin: 11.2 g/dL — ABNORMAL LOW (ref 12.0–15.0)
MCH: 20.2 pg — ABNORMAL LOW (ref 26.0–34.0)
MCHC: 30.5 g/dL (ref 30.0–36.0)
MCV: 66.2 fL — ABNORMAL LOW (ref 80.0–100.0)
Platelets: 196 10*3/uL (ref 150–400)
RBC: 5.54 MIL/uL — ABNORMAL HIGH (ref 3.87–5.11)
RDW: 15.1 % (ref 11.5–15.5)
WBC: 7.5 10*3/uL (ref 4.0–10.5)
nRBC: 0.4 % — ABNORMAL HIGH (ref 0.0–0.2)

## 2020-04-19 LAB — COMPREHENSIVE METABOLIC PANEL
ALT: 45 U/L — ABNORMAL HIGH (ref 0–44)
AST: 47 U/L — ABNORMAL HIGH (ref 15–41)
Albumin: 4 g/dL (ref 3.5–5.0)
Alkaline Phosphatase: 73 U/L (ref 38–126)
Anion gap: 12 (ref 5–15)
BUN: 18 mg/dL (ref 8–23)
CO2: 25 mmol/L (ref 22–32)
Calcium: 10.9 mg/dL — ABNORMAL HIGH (ref 8.9–10.3)
Chloride: 102 mmol/L (ref 98–111)
Creatinine, Ser: 0.75 mg/dL (ref 0.44–1.00)
GFR, Estimated: >60 mL/min (ref >60)
Glucose, Bld: 172 mg/dL — ABNORMAL HIGH (ref 70–99)
Potassium: 4.3 mmol/L (ref 3.5–5.1)
Sodium: 139 mmol/L (ref 135–145)
Total Bilirubin: 0.8 mg/dL (ref 0.3–1.2)
Total Protein: 7.1 g/dL (ref 6.5–8.1)

## 2020-04-19 LAB — I-STAT CHEM 8, ED
BUN: 21 mg/dL (ref 8–23)
Calcium, Ion: 1.31 mmol/L (ref 1.15–1.40)
Chloride: 103 mmol/L (ref 98–111)
Creatinine, Ser: 0.6 mg/dL (ref 0.44–1.00)
Glucose, Bld: 166 mg/dL — ABNORMAL HIGH (ref 70–99)
HCT: 37 % (ref 36.0–46.0)
Hemoglobin: 12.6 g/dL (ref 12.0–15.0)
Potassium: 4.3 mmol/L (ref 3.5–5.1)
Sodium: 140 mmol/L (ref 135–145)
TCO2: 25 mmol/L (ref 22–32)

## 2020-04-19 LAB — PROTIME-INR
INR: 1.2 (ref 0.8–1.2)
Prothrombin Time: 15.1 seconds (ref 11.4–15.2)

## 2020-04-19 LAB — APTT: aPTT: 34 seconds (ref 24–36)

## 2020-04-19 MED ORDER — SODIUM CHLORIDE 0.9% FLUSH: 3.0000 mL | Freq: Once | INTRAVENOUS | Status: DC

## 2020-04-19 MED ORDER — IOHEXOL 350 MG/ML SOLN
75.0000 mL | Freq: Once | INTRAVENOUS | Status: AC | PRN
  Administered 2020-04-19: 23:00:00 75 mL via INTRAVENOUS

## 2020-04-19 NOTE — ED Triage Notes (Signed)
Pt reports that she went completely blind in her right today at 2pm, blindness lasted approx. 5 minutes. Pt denies other neuro symptoms, tremor noted to left hand, pt reports that is residual from previous stroke.

## 2020-04-20 ENCOUNTER — Encounter: Payer: Self-pay | Admitting: Adult Health

## 2020-04-20 ENCOUNTER — Ambulatory Visit (INDEPENDENT_AMBULATORY_CARE_PROVIDER_SITE_OTHER): Payer: Medicare Other | Admitting: Neurology

## 2020-04-20 DIAGNOSIS — Z8673 Personal history of transient ischemic attack (TIA), and cerebral infarction without residual deficits: Secondary | ICD-10-CM

## 2020-04-20 DIAGNOSIS — G4733 Obstructive sleep apnea (adult) (pediatric): Secondary | ICD-10-CM | POA: Diagnosis not present

## 2020-04-20 DIAGNOSIS — I480 Paroxysmal atrial fibrillation: Secondary | ICD-10-CM

## 2020-04-20 NOTE — ED Notes (Signed)
Pt states she is not waiting here any longer and we better hope she doesn't die

## 2020-04-20 NOTE — ED Notes (Signed)
Pt states she is just running home to get 4 meds she must take including her Eliquis and she will return.

## 2020-04-21 ENCOUNTER — Other Ambulatory Visit: Payer: Self-pay | Admitting: Adult Health

## 2020-04-21 DIAGNOSIS — G4734 Idiopathic sleep related nonobstructive alveolar hypoventilation: Secondary | ICD-10-CM | POA: Insufficient documentation

## 2020-04-21 DIAGNOSIS — G8929 Other chronic pain: Secondary | ICD-10-CM | POA: Diagnosis not present

## 2020-04-21 DIAGNOSIS — Z7409 Other reduced mobility: Secondary | ICD-10-CM | POA: Diagnosis not present

## 2020-04-21 DIAGNOSIS — R2689 Other abnormalities of gait and mobility: Secondary | ICD-10-CM | POA: Diagnosis not present

## 2020-04-21 DIAGNOSIS — R29898 Other symptoms and signs involving the musculoskeletal system: Secondary | ICD-10-CM | POA: Diagnosis not present

## 2020-04-21 DIAGNOSIS — R269 Unspecified abnormalities of gait and mobility: Secondary | ICD-10-CM | POA: Diagnosis not present

## 2020-04-21 DIAGNOSIS — G4733 Obstructive sleep apnea (adult) (pediatric): Secondary | ICD-10-CM | POA: Insufficient documentation

## 2020-04-21 DIAGNOSIS — I69398 Other sequelae of cerebral infarction: Secondary | ICD-10-CM | POA: Diagnosis not present

## 2020-04-21 DIAGNOSIS — M546 Pain in thoracic spine: Secondary | ICD-10-CM | POA: Diagnosis not present

## 2020-04-21 MED ORDER — LOSARTAN POTASSIUM 50 MG PO TABS: 50.0000 mg | ORAL_TABLET | Freq: Every day | ORAL | 3 refills | Status: DC

## 2020-04-21 NOTE — Addendum Note (Signed)
Addended by: Larey Seat on: 04/21/2020 10:00 AM   Modules accepted: Orders

## 2020-04-21 NOTE — Progress Notes (Signed)
  CPAP was initiated under a nasal pillow, but this let to oral air-leaks and was changed to a DreamWear FFM in medium size. Beginning at 5 cmH20 with heated humidity per AASM standards and pressure was advanced to CPAP of 13 cmH20, yet could not control hypopneas, apneas and desaturations.   A change to BiPAP was initiated at 14/10 cm water and advanced to 17/13 cm water: At a BiPAP pressure of 14/10 cm water there was the best result noted with an AHI of 2.3/h with improvement of sleep apnea.  EKG was in keeping with normal sinus rhythm (NSR).  DIAGNOSIS 1. Obstructive Sleep Apnea responded partially to BiPAP pressures of 14/10 cm water and required higher pressures when patient changed to supine sleep position.  2. Central Sleep Apnea arose at higher BiPAP pressures.  3. Sleep Related Hypoxemia improved greatly on CPAP and BiPAP.  4. The presence of Mild Periodic Limb Movement Disorder was confirmed.    PLANS/RECOMMENDATIONS: Auto BiPAP with a 4 cm pressure spread from 13/9 through 18/14 cm water, heated humidification and a DreamWear FFM in medium size. Any PAP modality/ therapy compliance is defined as 4 hours or more of nightly use. Please contact the DME immediately if you have trouble using CPAP, mask or tubing.   DISCUSSION:A follow up appointment will be scheduled with our NPs in the Sleep Clinic at [facility].  Please call [phone] with any questions.     Larey Seat, M.D. Diplomat, Tax adviser of Psychiatry and Neurology  Diplomat, Tax adviser of Sleep Medicine Market researcher, [facility] [employer]

## 2020-04-21 NOTE — Procedures (Signed)
PATIENT'S NAME:  Breanna Webster, Breanna Webster DOB:      February 19, 1946      MR#:    [id number]     DATE OF RECORDING: 04/20/2020 B. Otto Herb M.D.:  Antony Contras, MD Study Performed:   Titration to positive airway pressure.  HISTORY:  Breanna Webster is a 74- year- old Caucasian female patient who returns for PAP therapy titration after her PSG from 03-23-2020 revealed the following: There is mild - moderate Obstructive Sleep Apnea (OSA) present which is reflected in an APNEA/HYPOPNEA INDEX (AHI) of 12.8/hour. 1. Sleep architecture was severely altered, highly fragmented sleep. Dysfunctions associated with sleep stages or arousal from sleep. 2. Delayed onset of REM sleep was noted, REM sleep was associated with continuous low oxygen saturation while sleeping on the left side.  Total 02 desaturation time was 70 minutes, SpO2 nadir was 76%.  3. Mild Periodic Limb Movement Disorder (PLMD). OXYGEN SATURATION & C02:  The Wake baseline 02 saturation was 90%, with the lowest being 76%. Time spent below 89% saturation equaled 70 minutes. The patient remained chronically oxygen desaturated during REM sleep.  She did suffer a stroke in 2020 which affected the left cerebellum and left cerebrum. 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  She developed double vision, imbalance dizziness and left hand shaking as a result.  She states that she had left hemiparesis and her left angle of her mouth was droopy.  The patient endorsed the Epworth Sleepiness Scale at 11 points.   The patient's weight 182 pounds with a height of 65 (inches), resulting in a BMI of 30.5 kg/m2. The patient's neck circumference  measured 16.5 inches.  CURRENT MEDICATIONS: Elavil, Vitamin D3, Cardizem, Eliquis, Cozaar, Glucophage, Multivitamins, Nitrostat, Novolin, Synthroid   PROCEDURE:  This is a multichannel digital polysomnogram utilizing the SomnoStar 11.2 system.  Electrodes and sensors were applied and monitored per AASM Specifications.   EEG, EOG, Chin and Limb EMG, were sampled at 200 Hz.  ECG, Snore and Nasal Pressure, Thermal Airflow, Respiratory Effort, CPAP Flow and Pressure, Oximetry was sampled at 50 Hz. Digital video and audio were recorded.      CPAP was initiated under a nasal pillow, but this let to oral air-leaks and was changed to a DreamWear FFM in medium size. Beginning at 5 cmH20 with heated humidity per AASM standards and pressure was advanced to CPAP of 13 cmH20, yet could not control hypopneas, apneas and desaturations.   A change to BiPAP was initiated at 14/10 cm water and advanced to 17/13 cm water: At a BiPAP pressure of 14/10 cm water there was the best result noted with an AHI of 2.3/h with improvement of sleep apnea.  Lights Out was at 00:02 and Lights On at 07:41. Total recording time (TRT) was 459.5 minutes, with a total sleep time (TST) of 326.5 minutes. The patient's sleep latency was 113.5 minutes. REM latency was 95 minutes. The sleep efficiency was 71.1 %.    SLEEP ARCHITECTURE: WASO (Wake after sleep onset) was 88 minutes.   There were 26.5 minutes in Stage N1, 222.5 minutes Stage N2, 16.5 minutes Stage N3 and 61 minutes in Stage REM.  The percentage of Stage N1 was 8.1%, Stage N2 was 68.1%, Stage N3  was 5.1% and Stage R (REM sleep) was 18.7%.  The arousals were noted as: 27 were spontaneous, 9 were associated with PLMs, 36 were associated with respiratory events.  RESPIRATORY ANALYSIS:  There was a total of 60 respiratory events: 1 obstructive apnea, 7 central apneas and 52 hypopneas with a hypopnea index of 9.6/hour. The patient also had 7 respiratory event related arousals (RERAs).      The total APNEA/HYPOPNEA INDEX  (AHI) was 11.0 /hour and the total RESPIRATORY DISTURBANCE INDEX was 12.3 /hour  9 events occurred in REM sleep and 51 events in NREM. The REM AHI was 8.9 /hour versus a non-REM AHI of 11.5 /hour. The patient spent 240 minutes of total sleep time in the supine position and 87 minutes in non-supine. The supine AHI was 13.0, versus a non-supine AHI of 5.5.  OXYGEN SATURATION & C02:  The baseline 02 saturation was 90%, with the lowest being 80%. Time spent below 89% saturation equaled 19 minutes.  PERIODIC LIMB MOVEMENTS:  The patient had a total of 229 Periodic Limb Movements. The Periodic Limb Movement (PLM) index Arousal index was 1.7 /hour. Audio and video analysis did not show any abnormal or unusual movements, behaviors, phonations or vocalizations.  The patient took 2 bathroom breaks. Snoring was noted under lower CPAP pressures. EKG was in keeping with normal sinus rhythm (NSR).  DIAGNOSIS 1. Obstructive Sleep Apnea responded partially to BiPAP pressures of 14/10 cm water and required higher pressures when patient changed to supine sleep position.  2. Central Sleep Apnea arose at higher BiPAP pressures.  3. Sleep Related Hypoxemia improved greatly on CPAP and BiPAP.  4. The presence of Mild Periodic Limb Movement Disorder was confirmed.    PLANS/RECOMMENDATIONS: Auto BiPAP with a 4 cm pressure spread from 13/9 through 18/14 cm water, heated humidification and a DreamWear FFM in medium size. Any PAP modality/ therapy compliance is defined as 4 hours or more of nightly use. Please contact the DME immediately if you have trouble using CPAP, mask or tubing.    DISCUSSION:A follow up appointment will be scheduled with our NPs in the Sleep Clinic at [facility].  Please call [phone] with any questions.      I certify that I have reviewed the entire raw data recording prior to the issuance of this report in accordance with the Standards of  Accreditation of the American Academy of Sleep Medicine (AASM)      Larey Seat, M.D. Diplomat, Tax adviser of Psychiatry and Neurology  Diplomat, Tax adviser of Sleep Medicine Market researcher, [facility] [employer] [facility] [employer]

## 2020-04-23 DIAGNOSIS — G20A1 Parkinson's disease without dyskinesia, without mention of fluctuations: Secondary | ICD-10-CM

## 2020-04-23 HISTORY — DX: Parkinson's disease without dyskinesia, without mention of fluctuations: G20.A1

## 2020-04-25 ENCOUNTER — Telehealth: Payer: Self-pay | Admitting: Neurology

## 2020-04-25 NOTE — Telephone Encounter (Signed)
I called pt and spoke with her husband on DPR . I advised pt that Dr. Brett Fairy reviewed the pt's sleep study results and found that pt has sleep apnea best treated under BiPAP. Dr. Brett Fairy recommends that pt starts auto BiPAP. I reviewed PAP compliance expectations with the pt's husband. Pt's husband is agreeable to  Her starting a BiPAP. I advised pt's husband that an order will be sent to a DME, [facility] and [facility] will call the pt within about one week after they file with the pt's insurance. [facility]) will show the pt how to use the machine, fit for masks, and troubleshoot the BiPAP if needed. A follow up appt will need to be made for insurance purposes with Dr. Brett Fairy or the NP. Pt's husband verbalized understanding to call and schedule that apt 61-90 days from the date she gets the machine. A letter with all of this information in it will be sent to the pt as a reminder. I verified with the pt that the address we have on file is correct. Pt's husband verbalized understanding of results. Pt's husband had no questions at this time but was encouraged to call back if questions arise. I have sent the order to [facility]) and have received confirmation that they have received the order.

## 2020-04-25 NOTE — Telephone Encounter (Signed)
-----   Message from Larey Seat, MD sent at 04/21/2020 10:00 AM EST -----  CPAP was initiated under a nasal pillow, but this let to oral air-leaks and was changed to a DreamWear FFM in medium size. Beginning at 5 cmH20 with heated humidity per AASM standards and pressure was advanced to CPAP of 13 cmH20, yet could not control hypopneas, apneas and desaturations.   A change to BiPAP was initiated at 14/10 cm water and advanced to 17/13 cm water: At a BiPAP pressure of 14/10 cm water there was the best result noted with an AHI of 2.3/h with improvement of sleep apnea.  EKG was in keeping with normal sinus rhythm (NSR).  DIAGNOSIS 1. Obstructive Sleep Apnea responded partially to BiPAP pressures of 14/10 cm water and required higher pressures when patient changed to supine sleep position.  2. Central Sleep Apnea arose at higher BiPAP pressures.  3. Sleep Related Hypoxemia improved greatly on CPAP and BiPAP.  4. The presence of Mild Periodic Limb Movement Disorder was confirmed.    PLANS/RECOMMENDATIONS: Auto BiPAP with a 4 cm pressure spread from 13/9 through 18/14 cm water, heated humidification and a DreamWear FFM in medium size. Any PAP modality/ therapy compliance is defined as 4 hours or more of nightly use. Please contact the DME immediately if you have trouble using CPAP, mask or tubing.   DISCUSSION:A follow up appointment will be scheduled with our NPs in the Sleep Clinic at [facility].  Please call [phone] with any questions.     Larey Seat, M.D. Diplomat, Tax adviser of Psychiatry and Neurology  Diplomat, Tax adviser of Sleep Medicine Market researcher, [facility] [employer]

## 2020-05-03 ENCOUNTER — Other Ambulatory Visit: Payer: Self-pay | Admitting: *Deleted

## 2020-05-03 NOTE — Patient Instructions (Signed)
Goals Addressed            This Visit's Progress   . COMPLETED: exercise       Start walking 5 days a week  Resolve duplicate goal    . Monitor and Manage My Blood Sugar   On track    Timeframe:  Long-Range Goal Priority:  High Start Date: 03/02/20                            Expected End Date: 10/20/20                       Follow Up Date 07/20/20    - check blood sugar at prescribed times - check blood sugar if I feel it is too high or too low - enter blood sugar readings and medication or insulin into daily log - take the blood sugar log to all doctor visits - take the blood sugar meter to all doctor visits    Why is this important?   Checking your blood sugar at home helps to keep it from getting very high or very low.  Writing the results in a diary or log helps the doctor know how to care for you.  Your blood sugar log should have the time, date and the results.  Also, write down the amount of insulin or other medicine that you take.  Other information, like what you ate, exercise done and how you were feeling, will also be helpful.     Notes: Patient is checking her blood sugar routinely several times daily and records her values. Patient sees her endocrinologist routinely and she does bring her blood sugar dairy to the visits.      . COMPLETED: Patient Stated       Keep doing the good work with your weight! Resolve duplicate goal    . COMPLETED: Perform Foot Care       Timeframe:  Long-Range Goal Priority:  High Start Date: 03/02/20                            Expected End Date:  05/03/20                     Follow Up Date 05/03/20   - check feet daily for cuts, sores or redness - keep feet up while sitting - trim toenails straight across - wash and dry feet carefully every day - wear comfortable, cotton socks - wear comfortable, well-fitting shoes    Why is this important?   Good foot care is very important when you have diabetes.  There are many things you  can do to keep your feet healthy and catch a problem early.    Notes: Patient reports that she checks her feet daily and does not have any breakdown or issues.     . COMPLETED: Weight (lb) < 180 lb (81.6 kg)       Go back to weight watchers  Weight watchers has a app themselves  Check out  online nutrition programs as GumSearch.nl and http://vang.com/; fit45m; Look for foods with "whole" wheat; bran; oatmeal etc Shot at the farmer's markets in season for fresher choices  Watch for "hydrogenated" on the label of oils which are trans-fats.  Watch for "high fructose corn syrup" in snacks, yogurt or ketchup  Meats have less marbling; bright colored fruits and  vegetables;  Canned; dump out liquid and wash vegetables. Be mindful of what we are eating  Portion control is essential to a health weight! Sit down; take a break and enjoy your meal; take smaller bites; put the fork down between bites;  It takes 20 minutes to get full; so check in with your fullness cues and stop eating when you start to fill full   Resolved due to date of goal and duplicate.

## 2020-05-03 NOTE — Patient Outreach (Signed)
[facility]) Care Management  [place]  05/03/2020   Breanna Webster 08-26-1945 [id number]  Subjective: Successful telephone outreach call to patient. HIPAA identifiers obtained. Patient reports that she is doing well. She explained that she has been diagnosed with sleep apnea and is waiting to receive a Bi-PAP machine. Patient was told that the machine is out of stock and they are waiting for a shipment. Patient stated she will notify Dr. Brett Fairy of the above situation. Patient has completed PT and is continuing with her PT exercises. She walks routinely to maintain her strength, balance, and for her overall health. Patient recently presented to the ED due to acute right eye blindness that lasted for 5 minutes. Fortunately, the scans did not show anything emergent.  Patient reports that her last A1c was 6.2. She explains that her blood sugar ranges have been good and she continues to adhere to an ADA diet plan. Patient states that her blood sugar drops low occasionally if she does not eat before lunch or dinner. Nurse encouraged patient to continue with snacks and will send her Glucerna coupons. Patient did not have any further questions or concerns today and did confirm that the patient has this nurse's contact number to call her if needed.   Encounter Medications:  Outpatient Encounter Medications as of 05/03/2020  Medication Sig  . amitriptyline (ELAVIL) 25 MG tablet TAKE 1 TABLET(25 MG) BY MOUTH AT BEDTIME  . BD INSULIN SYRINGE U/F 31G X 5/16" 1 ML MISC INJECT 4 TIMES DAILY SUBCUTANEOUSLY  . Cholecalciferol (VITAMIN D3 PO) Take 1 tablet by mouth daily.  . clonazePAM (KLONOPIN) 0.25 MG disintegrating tablet Take 1 tablet (0.25 mg total) by mouth at bedtime and may repeat dose one time if needed. Please bring to your next sleep study 2 tablets of this order with you.  . diltiazem (CARDIZEM CD) 120 MG 24 hr capsule TAKE 1 CAPSULE(120 MG) BY MOUTH DAILY  . ELIQUIS 5 MG  TABS tablet TAKE 1 TABLET BY MOUTH TWICE DAILY  . Lancets (ONETOUCH DELICA PLUS [id number]) MISC USE TO CHECK BLOOD SUGAR TWICE DAILY..  . losartan (COZAAR) 50 MG tablet Take 1 tablet (50 mg total) by mouth daily.  . metFORMIN (GLUCOPHAGE) 500 MG tablet Take 500 mg by mouth 2 (two) times daily with a meal.   . Multiple Vitamin (MULITIVITAMIN WITH MINERALS) TABS Take 1 tablet by mouth daily.  . nitroGLYCERIN (NITROSTAT) 0.4 MG SL tablet Place 0.4 mg under the tongue every 5 (five) minutes as needed for chest pain.   Marland Kitchen NOVOLIN N 100 UNIT/ML injection Inject 40 Units into the skin daily before breakfast.   . NOVOLIN R 100 UNIT/ML injection 10 UNITS THIRTY MINUTES BEFORE MEALS.  5 ADDITIONAL UNITS WITH CARBS OR SNACKS.  [name] VERIO test strip USE TO MONITOR GLUCOSE LEVELS TWICE DAILY  . SYNTHROID 125 MCG tablet Take 125 mcg by mouth every morning.   No facility-administered encounter medications on file as of 05/03/2020.    Functional Status:  In your present state of health, do you have any difficulty performing the following activities: 03/14/2020 09/23/2019  Hearing? N N  Vision? N N  Difficulty concentrating or making decisions? Tempie Donning  Comment has some issue with short term memory per patient poor STM since she had a stroke  Walking or climbing stairs? Y N  Dressing or bathing? N N  Doing errands, shopping? Y N  Preparing Food and eating ? N N  Using the Toilet?  N N  In the past six months, have you accidently leaked urine? N Y  Comment - after my stroke I have had leaking  Do you have problems with loss of bowel control? N N  Managing your Medications? N N  Managing your Finances? N N  Housekeeping or managing your Housekeeping? N N  Some recent data might be hidden    Fall/Depression Screening: Fall Risk  05/03/2020 05/03/2020 03/14/2020  Falls in the past year? 0 - 0  Number falls in past yr: 0 - 0  Injury with Fall? 0 - 0  Risk for fall due to : Impaired balance/gait Impaired  balance/gait Impaired balance/gait  Risk for fall due to: Comment - - -  Follow up Falls prevention discussed;Education provided;Falls evaluation completed Falls prevention discussed;Education provided;Falls evaluation completed Falls evaluation completed;Falls prevention discussed   PHQ 2/9 Scores 03/14/2020 03/02/2020 09/23/2019 08/25/2019 12/10/2018 10/28/2018 07/31/2017  PHQ - 2 Score 0 1 1 0 0 0 0  PHQ- 9 Score 0 - - 4 - - -    Assessment:  Goals Addressed            This Visit's Progress   . COMPLETED: exercise       Start walking 5 days a week  Resolve duplicate goal    . Monitor and Manage My Blood Sugar   On track    Timeframe:  Long-Range Goal Priority:  High Start Date: 03/02/20                            Expected End Date: 10/20/20                       Follow Up Date 07/20/20    - check blood sugar at prescribed times - check blood sugar if I feel it is too high or too low - enter blood sugar readings and medication or insulin into daily log - take the blood sugar log to all doctor visits - take the blood sugar meter to all doctor visits    Why is this important?   Checking your blood sugar at home helps to keep it from getting very high or very low.  Writing the results in a diary or log helps the doctor know how to care for you.  Your blood sugar log should have the time, date and the results.  Also, write down the amount of insulin or other medicine that you take.  Other information, like what you ate, exercise done and how you were feeling, will also be helpful.     Notes: Patient is checking her blood sugar routinely several times daily and records her values. Patient sees her endocrinologist routinely and she does bring her blood sugar dairy to the visits.      . COMPLETED: Patient Stated       Keep doing the good work with your weight! Resolve duplicate goal    . COMPLETED: Perform Foot Care       Timeframe:  Long-Range Goal Priority:  High Start Date:  03/02/20                            Expected End Date:  05/03/20                     Follow Up Date 05/03/20   - check feet daily for cuts,  sores or redness - keep feet up while sitting - trim toenails straight across - wash and dry feet carefully every day - wear comfortable, cotton socks - wear comfortable, well-fitting shoes    Why is this important?   Good foot care is very important when you have diabetes.  There are many things you can do to keep your feet healthy and catch a problem early.    Notes: Patient reports that she checks her feet daily and does not have any breakdown or issues.     . COMPLETED: Weight (lb) < 180 lb (81.6 kg)       Go back to weight watchers  Weight watchers has a app themselves  Check out  online nutrition programs as GumSearch.nl and http://vang.com/; fit92m; Look for foods with "whole" wheat; bran; oatmeal etc Shot at the farmer's markets in season for fresher choices  Watch for "hydrogenated" on the label of oils which are trans-fats.  Watch for "high fructose corn syrup" in snacks, yogurt or ketchup  Meats have less marbling; bright colored fruits and vegetables;  Canned; dump out liquid and wash vegetables. Be mindful of what we are eating  Portion control is essential to a health weight! Sit down; take a break and enjoy your meal; take smaller bites; put the fork down between bites;  It takes 20 minutes to get full; so check in with your fullness cues and stop eating when you start to fill full   Resolved due to date of goal and duplicate.           Plan: RN Health Coach will call patient within the month of April Follow-up:  Patient agrees to Care Plan and Follow-up.  JEmelia LoronRN, BSN [email or web address] [phone] [email or web address]

## 2020-05-04 ENCOUNTER — Telehealth: Payer: Self-pay | Admitting: Neurology

## 2020-05-04 NOTE — Telephone Encounter (Signed)
I called patient.  I reminded her of the national shortage of CPAP's and BiPAPs.  Unfortunately, there is nothing we can do on our end to expedite this process.  I recommended that she keep close contact with [facility] because they will be the ones to let her know when her BiPAP is ready.  Patient verbalized understanding.

## 2020-05-04 NOTE — Telephone Encounter (Signed)
Pt. states the company that she uses for her CPAP machine states they are out of stock & what does she need to do to get one. Please advise.

## 2020-05-05 DIAGNOSIS — E1149 Type 2 diabetes mellitus with other diabetic neurological complication: Secondary | ICD-10-CM | POA: Diagnosis not present

## 2020-05-05 DIAGNOSIS — E039 Hypothyroidism, unspecified: Secondary | ICD-10-CM | POA: Diagnosis not present

## 2020-05-05 LAB — BASIC METABOLIC PANEL
BUN: 22 — AB (ref 4–21)
CO2: 30 — AB (ref 13–22)
Chloride: 103 (ref 99–108)
Creatinine: 0.8 (ref 0.5–1.1)
Glucose: 159
Potassium: 4.5 (ref 3.4–5.3)
Sodium: 143 (ref 137–147)

## 2020-05-05 LAB — COMPREHENSIVE METABOLIC PANEL
Albumin: 4 (ref 3.5–5.0)
Calcium: 10.1 (ref 8.7–10.7)
GFR calc Af Amer: 89.33
GFR calc non Af Amer: 73.83
Globulin: 3.4

## 2020-05-05 LAB — TSH: TSH: 1.47 (ref 0.41–5.90)

## 2020-05-05 LAB — HEPATIC FUNCTION PANEL
ALT: 53 — AB (ref 7–35)
AST: 28 (ref 13–35)
Alkaline Phosphatase: 81 (ref 25–125)
Bilirubin, Total: 0.7

## 2020-05-05 LAB — HEMOGLOBIN A1C: Hemoglobin A1C: 6.5

## 2020-05-12 DIAGNOSIS — Z6831 Body mass index (BMI) 31.0-31.9, adult: Secondary | ICD-10-CM | POA: Diagnosis not present

## 2020-05-12 DIAGNOSIS — E1149 Type 2 diabetes mellitus with other diabetic neurological complication: Secondary | ICD-10-CM | POA: Diagnosis not present

## 2020-05-12 DIAGNOSIS — E039 Hypothyroidism, unspecified: Secondary | ICD-10-CM | POA: Diagnosis not present

## 2020-05-12 DIAGNOSIS — Z952 Presence of prosthetic heart valve: Secondary | ICD-10-CM | POA: Diagnosis not present

## 2020-05-12 DIAGNOSIS — Z794 Long term (current) use of insulin: Secondary | ICD-10-CM | POA: Diagnosis not present

## 2020-05-12 DIAGNOSIS — Z8673 Personal history of transient ischemic attack (TIA), and cerebral infarction without residual deficits: Secondary | ICD-10-CM | POA: Diagnosis not present

## 2020-05-12 DIAGNOSIS — I2510 Atherosclerotic heart disease of native coronary artery without angina pectoris: Secondary | ICD-10-CM | POA: Diagnosis not present

## 2020-05-12 DIAGNOSIS — I4891 Unspecified atrial fibrillation: Secondary | ICD-10-CM | POA: Diagnosis not present

## 2020-05-18 ENCOUNTER — Encounter: Payer: Self-pay | Admitting: Family Medicine

## 2020-05-18 NOTE — Telephone Encounter (Signed)
Pt has called asking if there is another DME outside of [place].  Pt was reminded of what she was told on 01-12.  Pt is still asking to be called due to her concern about her low oxygen levels.

## 2020-05-23 NOTE — Telephone Encounter (Signed)
Called the patient. There was no answer. LVM to  Advise the pt that I had contacted the manager.  Manager states they have attempted to reach out to her twice to get her set up with the BiPAP machine.  Advised the patient that she should contact the company [phone], to get set up with her machine.  That number is for the [facility] location however they are ready to get her set up at the [facility] location.  On the voicemail advised the patient she may have a different number that she can call otherwise the company can give her the number contact.  Once the patient is set up on the BiPAP we need to make sure to schedule a 61 to 90-day follow-up visit.  **If patient calls back please reiterate above message

## 2020-05-23 NOTE — Telephone Encounter (Signed)
Also [facility] office number was provided to me [phone]. This number can be provided.

## 2020-05-23 NOTE — Telephone Encounter (Signed)
If I went to switch to another company it would basically restart her whole process. At this time I am reaching out to the company to check on her status. Overall I feel like timing is still appropriate considering order was sent 04/25/2020 and it can take up to 2 weeks for insurance authorization then getting the inventory and scheduling. I have reached out to manager there to check on the status. Given she is getting a BiPAP she should be getting a phone call to be set up fairly soon. Will await to hear from company first.

## 2020-05-23 NOTE — Telephone Encounter (Signed)
Pt. called back & was read message from RN. She was given both office location phone numbers & verbalized understanding.

## 2020-05-31 DIAGNOSIS — H35033 Hypertensive retinopathy, bilateral: Secondary | ICD-10-CM | POA: Diagnosis not present

## 2020-06-14 ENCOUNTER — Other Ambulatory Visit: Payer: Self-pay | Admitting: Adult Health

## 2020-06-14 DIAGNOSIS — Z760 Encounter for issue of repeat prescription: Secondary | ICD-10-CM

## 2020-06-20 ENCOUNTER — Ambulatory Visit: Payer: Medicare Other | Admitting: Adult Health

## 2020-06-22 ENCOUNTER — Telehealth: Payer: Self-pay

## 2020-06-22 ENCOUNTER — Encounter: Payer: Self-pay | Admitting: Adult Health

## 2020-06-22 ENCOUNTER — Ambulatory Visit (INDEPENDENT_AMBULATORY_CARE_PROVIDER_SITE_OTHER): Payer: Medicare Other | Admitting: Adult Health

## 2020-06-22 VITALS — BP 125/70 | HR 70 | Ht 65.0 in | Wt 184.0 lb

## 2020-06-22 DIAGNOSIS — G4733 Obstructive sleep apnea (adult) (pediatric): Secondary | ICD-10-CM | POA: Diagnosis not present

## 2020-06-22 DIAGNOSIS — Z8673 Personal history of transient ischemic attack (TIA), and cerebral infarction without residual deficits: Secondary | ICD-10-CM

## 2020-06-22 NOTE — Telephone Encounter (Signed)
Called pt regarding her appointment today with [name] for stroke FU, was trying to see if she would like to have her CPAP compliance and stroke FU both in the same visit. Wanted to see if she would like to reschedule for 6 weeks out to do both FU's per [place]. LVM asking her to give [name] a call back to reschedule if she like.

## 2020-06-22 NOTE — Patient Instructions (Signed)
Ongoing use of your BiPAP machine as this will greatly reduce your risk of stroke  Continue Eliquis (apixaban) daily for secondary stroke prevention  Continue to follow up with PCP regarding blood pressure management and endocrinology for diabetic management Maintain strict control of hypertension with blood pressure goal below 130/90, and diabetes with hemoglobin A1c goal below 7.0        Thank you for coming to see [name] at [facility]. I hope we have been able to provide you high quality care today.  You may receive a patient satisfaction survey over the next few weeks. We would appreciate your feedback and comments so that we may continue to improve ourselves and the health of our patients.

## 2020-06-22 NOTE — Progress Notes (Signed)
[facility] [address] [place]. Breanna Webster [address] [phone]       OFFICE FOLLOW UP NOTE  Ms. CASMIRA Webster Date of Birth:  1945-09-17 Medical Record Number:  [id number]   Reason for Referral: stroke follow up    CHIEF COMPLAINT:  Chief Complaint  Patient presents with  . Follow-up    TR with husband (Breanna Webster) Pt states she still has hand tremors, occasional dizziness    HPI:  Today, 06/22/2020, Breanna Webster returns for stroke follow-up accompanied by her husband.  Reports continued mild left hand tremor with mild left hand weakness -stable without worsening Prior complaints of gait unsteadiness -reports great improvement after working with PT -she has been able to increase ambulation distance - per husband, able to walk 3-4 blocks without stopping where previously difficulty ambulating very short distances She also reports occasional dizziness typically with position changes that last short duration  She does report in December, she presented to the ED with acute onset right eye visual loss which lasted approximately 15 minutes and then completely resolved.  CTA head/neck unremarkable. Reports evaluation by ophthalmology with no concerning findings that may have possibly contributed.  She has not had any additional visual loss or visual symptoms  Reports compliance on Eliquis 5 mg twice daily -denies bleeding or bruising Glucose levels stable with recent A1c 6.5 (04/2020) Routinely follows with cardiology, PCP and endocrinology  Due to concerns of continued excessive daytime fatigue, she was referred to [facility] for possible repeat sleep study as prior sleep study in 08/2018 was negative for sleep apnea.  Evaluated by Dr. Brett Fairy and underwent sleep study on 03/29/2020 which showed AHI 12.8 with REM sleep associated with continuous low oxygen saturation with total O2 desat time 70 minutes with SPO2 nadir 76% and mild PLM.  Underwent CPAP titration  04/21/2020 with apnea unresponsive to hypopneas, apnea and desaturations but OSA responded partially to BiPAP 14/10 with central sleep apnea present with higher BiPAP pressures, sleep-related hypoxemia improved greatly on CPAP and BiPAP, presence of mild PLM confirmed.  Recommended initiating auto BiPAP which she started on 2/9.  She appears to be tolerating without difficulty but concerned as she continues to feel daytime fatigue.  No further concerns at this time      History provided for reference purposes only Update 12/21/2019 JM: [name] returns for stroke follow-up.  She is accompanied by her husband. Residual deficits of left hand tremor, unsteadiness and short-term memory impairment.  She will have occasional dizziness but typically upon standing from seated position.  Denies any reoccurring diplopia Prior concern of worsening diplopia, imbalance, dizziness and left hand shaking therefore obtain MRI which was negative for acute abnormality She was referred to PT at prior visit but for unknown reason, they were not called to schedule initial evaluation.  She is interested in pursuing PT Denies new stroke/TIA symptoms Remains on Eliquis 5 mg twice daily without bleeding or bruising for secondary stroke prevention and history of atrial fibrillation Blood pressure today 128/64 Glucose levels stable per patient HTN, HLD and DM currently managed by PCP She has multiple other complaints today: She also reports excessive daytime fatigue, insomnia, occasional nocturia and snoring Underwent sleep study approximately 1 year ago which showed mild sleep apnea with AHI 5.0.  Does report worsening daytime fatigue and insomnia and she continues to question accuracy of sleep study results She is currently being evaluated by neurosurgery for right underarm and chest numbness/tingling with concerns of thoracic involvement.  Plans on obtaining MRI cervical and thoracic scheduled on 01/15/2020 No  further concerns at this time  Update 09/14/2019 JM: Breanna Webster is a 75 year old female with underlying medical history of DVT history, aortic stenosis s/p TAVR 06/2018, hypothyroidism, pulmonary hypertension, HTN, DM, CAD s/p stenting 03/2018, hx of infective endocarditis 09/2018 and multifocal bilateral anterior posterior infarcts secondary to new onset A. fib in 09/2018.  Patient requests appointment today with concern for intermittent double vision, imbalance, dizziness and left hand shaking.  She is accompanied by her husband.  Previously seen roughly 6 months ago stable from a stroke standpoint and advised to follow-up as needed.  Prior complaints of intermittent diplopia, left hemiparesis and imbalance secondary to multiple cardioembolic strokes throughout the supratentorial brain, brainstem and cerebellum including multiple lesions within the corpus callosum.  She believes she continued to have intermittent symptoms but believes over the past few months symptoms have worsened or become more frequent.  she did have ophthalmology evaluation on 09/07/2019 with concern of bilateral diplopia worsened with visual tasks with saccadic changes.  Experiences worsening dizziness or diplopia while watching TV for too long.  Also experience dizziness if standing too quickly or rapid head movements.  Symptoms only last for 10 to 20 seconds and then resolve.  Episodes occur 1-2 times per week.  Denies headache, migraine, worsening weakness, numbness/tingling, speech impairment or confusion.  She endorses ongoing compliance with all prescribed medications including Eliquis 5 mg twice daily.  Blood pressure monitored at home which has been stable with today's reading 125/75.  Underlying mild cognitive impairment post stroke which has been stable without worsening.  She questions clearance for driving.  Continues to follow with PCP for HTN, HLD and DM management.  No further concerns at this time.  Update 03/12/2019 JM: Ms.  Webster is a 75 year old female who is being seen today for stroke follow-up accompanied by her husband.  She continues to experience balance difficulties, mild left hemiparesis and mild cognitive impairment without much improvement.  Her greatest concern today is ongoing shortness of breath which has been present since the beginning of this year.  Her shortness of breath severely limits her daily functioning and activity.  Recently seen by Dr. Tamala Julian, her cardiologist, who felt continued shortness of breath was multifactorial but highly encouraged increasing activity and possibly cardiac rehab.  She feels as though "something is being missed" and is overly frustrated.  She also continues to experience excessive daytime fatigue and insomnia at night.  Prior sleep study negative for OSA.  She continues on Eliquis and aspirin without bleeding or bruising.  Blood pressure today satisfactory at 128/78.  Continues on home Lexapro 20 mg daily and feels as though depression and anxiety stable.  She also continues on amitriptyline 25 mg nightly for prior concerns with headache.  She denies any reoccurring headaches at this time.  No further concerns at this time.  Initial visit 12/10/2018 JM: Residual deficits of mild left hemiparesis, mild left facial droop with occasional speech difficulty, balance difficulties, decreased vision and short-term memory concerns.  She does have difficulty ambulating at times stating her legs feel "rubbery" typically worse worsening in the morning and in the evening.  She is able to ambulate without assistive device.  Prior complaints of left-sided vision with sensation of a shade pulled over her left eye but this is since resolved and recently obtained new prescription glasses by ophthalmology.  She also endorses short-term memory concerns such as forgetting recent question/answer or her husband having  to continuously repeat himself.  She denies improvement but her husband states he has  seen improvement of her overall ambulation and memory concerns.  She has not received any therapy as this was not recommended at discharge.  She is questioning possible participation in therapy. Previously experiencing left-sided severe headache with recent cessation with continued use of  amitriptyline 25 mg nightly and gabapentin 300 mg nightly.  It was recommended to discontinue gabapentin 300 mg nightly by PCP as she did not gain benefit but she is not aware of this and has continued.  Symptoms previously located left occipital radiating to frontal region with short duration sharp stabbing radiating pain which appears to be secondary to possible occipital neuralgia. Previously underwent sleep study for possible OSA which was negative Continues on Eliquis and aspirin 81 mg without bleeding or bruising Continues to follow with PCP for DM management Blood pressure today 107/60 States depression/anxiety well controlled with continuation of Lexapro No further concerns at this time  Stroke admission 10/21/2018: Ms. KALESE Webster is a 75 y.o. female with history of DVT, aortic stenosis s/p TAVR in March 2020, hypothyroidism, pulmonary hypertension, HTN, CADs/p stenting December 2019 recently admitted for infective endocarditis on the mitral valve treated with 2 weeks gentamicin and ceftriaxone via PEG who presented on 10/21/2018 to the hospital with increased shortness of breath and confusion x2 weeks along with headache.  No focal neurologic symptoms.  New A. Fib with RVR found in the ED and started on heparin drip.  Neurology consulted with work-up revealing multifocal bilateral anterior and posterior infarcts cardioembolic pattern as evidenced on MRI secondary to new onset A. fib versus recent endocarditis.  MRI showed diffuse scattered subcentimeter foci of reduced diffusion and intermediate diffusion throughout the supratentorial brain, brainstem and cerebellum including multiple lesions within the  corpus callosum.  Vessel imaging unremarkable with only mild arthrosclerotic bilateral ICA bifurcations.  2D echo normal EF without mention of vegetation.  Recommended initiating D [place] with new onset of A. fib.  HTN stable.  LDL 85 and due to history of statin allergy with myalgias, statin initiation was not recommended.  A1c 7.5 and recommended follow-up with PCP for uncontrolled DM.  Other stroke risk factors include advanced age, obesity, CAD, MVR and status post TAVR 06/2018.  She was discharged home in stable condition without therapy.  She returned to ED on 11/17/2018 with complaints of sudden onset left-sided headache located left parietal occipital area at the base of her neck.  CTA unremarkable for dissection or other abnormality.  She did receive 2 migraine cocktails with improvement of headache symptoms.  She has since followed up with her PCP who initiated gabapentin along with continuation of nortriptyline 50 mg nightly.  She apparently did not gain benefit from gabapentin therefore recommended discontinuing gabapentin and nortriptyline by PCP and initiated on amitriptyline.  She did have follow-up with ophthalmologist with concerns of occipital neuralgia.        ROS:   14 system review of systems performed and negative with exception of see HPI  PMH:  Past Medical History:  Diagnosis Date  . Anxiety   . Arthritis    "back" (04/22/2018)  . Back pain   . Blood transfusion without reported diagnosis   . CAD (coronary artery disease)    a. 03/2018 s/p PCI/DES to the RCA (3.0x15 Onyx DES).  . Carotid artery stenosis    Mild  . Chest pain   . Chronic lower back pain   . Cirrhosis ([place])   .  Colon polyps   . Diverticulitis   . Diverticulosis   . Esophageal thickening    seen on pre TAVR CT scan, also questionable cirrhosis. MRI recommended. Will refer to GI  . Fatty liver   . GERD (gastroesophageal reflux disease)   . Grave's disease   . History of colonic polyps 05/22/2017  .  History of hiatal hernia   . Hypertension   . Hypothyroidism   . IBS (irritable bowel syndrome)   . Osteopenia   . Pulmonary nodules    seen on pre TAVR CT. likley benign. no follow up recommended if pt low risk.  . S/P TAVR (transcatheter aortic valve replacement)   . Severe aortic stenosis   . Shortness of breath on exertion   . Stroke ([place])   . Thalassemia minor   . Thyroid disease   . Type II diabetes mellitus (HCC)     PSH:  Past Surgical History:  Procedure Laterality Date  . [place] STUDY N/A 03/03/2018   Procedure: [place] STUDY;  Surgeon: Mauri Pole, MD;  Location: [facility];  Service: Endoscopy;  Laterality: N/A;  . COLONOSCOPY    . COLONOSCOPY W/ BIOPSIES AND POLYPECTOMY    . CORONARY ANGIOGRAPHY Right 04/21/2018   Procedure: CORONARY ANGIOGRAPHY (CATH LAB);  Surgeon: [name], MD;  Location: [facility];  Service: Cardiovascular;  Laterality: Right;  . CORONARY STENT INTERVENTION N/A 04/22/2018   Procedure: CORONARY STENT INTERVENTION;  Surgeon: [name], MD;  Location: [facility];  Service: Cardiovascular;  Laterality: N/A;  . DILATION AND CURETTAGE OF UTERUS    . ESOPHAGEAL MANOMETRY N/A 03/03/2018   Procedure: ESOPHAGEAL MANOMETRY (EM);  Surgeon: Mauri Pole, MD;  Location: [facility];  Service: Endoscopy;  Laterality: N/A;  . GASTRIC FUNDOPLICATION    . HERNIA REPAIR    . HYSTEROSCOPY     fibroids  . LAPAROSCOPIC CHOLECYSTECTOMY    . LAPAROSCOPY     fibroids  . NISSEN FUNDOPLICATION  1610R  . POLYPECTOMY    . RIGHT/LEFT HEART CATH AND CORONARY ANGIOGRAPHY N/A 02/20/2018   Procedure: RIGHT/LEFT HEART CATH AND CORONARY ANGIOGRAPHY;  Surgeon: [name], MD;  Location: [facility];  Service: Cardiovascular;  Laterality: N/A;  . TEE WITHOUT CARDIOVERSION N/A 07/08/2018   Procedure: TRANSESOPHAGEAL ECHOCARDIOGRAM (TEE);  Surgeon: Burnell Blanks, MD;  Location: [facility];  Service: Open Heart  Surgery;  Laterality: N/A;  . TEE WITHOUT CARDIOVERSION  10/07/2018  . TEE WITHOUT CARDIOVERSION N/A 10/07/2018   Procedure: TRANSESOPHAGEAL ECHOCARDIOGRAM (TEE);  Surgeon: Jerline Pain, MD;  Location: [facility] ENDOSCOPY;  Service: Cardiovascular;  Laterality: N/A;  . TONSILLECTOMY    . TRANSCATHETER AORTIC VALVE REPLACEMENT, TRANSFEMORAL N/A 07/08/2018   Procedure: TRANSCATHETER AORTIC VALVE REPLACEMENT, TRANSFEMORAL;  Surgeon: Burnell Blanks, MD;  Location: [facility];  Service: Open Heart Surgery;  Laterality: N/A;    Social History:  Social History   Socioeconomic History  . Marital status: Married    Spouse name: Not on file  . Number of children: 2  . Years of education: Not on file  . Highest education level: Not on file  Occupational History  . Occupation: Tree surgeon of the [employer]  Tobacco Use  . Smoking status: Never Smoker  . Smokeless tobacco: Never Used  Vaping Use  . Vaping Use: Never used  Substance and Sexual Activity  . Alcohol use: No    Alcohol/week: 0.0 standard drinks  .  Drug use: Never  . Sexual activity: Not Currently  Other Topics Concern  . Not on file  Social History Narrative   Lives with husband in a one story home.     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   Education: college.   Social Determinants of Health   Financial Resource Strain: Low Risk   . Difficulty of Paying Living Expenses: Not hard at all  Food Insecurity: No Food Insecurity  . Worried About Charity fundraiser in the Last Year: Never true  . Ran Out of Food in the Last Year: Never true  Transportation Needs: No Transportation Needs  . Lack of Transportation (Medical): No  . Lack of Transportation (Non-Medical): No  Physical Activity: Insufficiently Active  . Days of Exercise per Week: 2 days  . Minutes of Exercise per Session: 50 min  Stress: Not on file  Social Connections: Socially  Integrated  . Frequency of Communication with Friends and Family: More than three times a week  . Frequency of Social Gatherings with Friends and Family: Once a week  . Attends Religious Services: More than 4 times per year  . Active Member of Clubs or Organizations: Yes  . Attends Archivist Meetings: More than 4 times per year  . Marital Status: Married  Human resources officer Violence: Not At Risk  . Fear of Current or Ex-Partner: No  . Emotionally Abused: No  . Physically Abused: No  . Sexually Abused: No    Family History:  Family History  Adopted: Yes  Problem Relation Age of Onset  . Healthy Son        x 2  . Headache Other        Cluster headaches  . Heart failure Mother   . Colon cancer Neg Hx   . Pancreatic cancer Neg Hx   . Rectal cancer Neg Hx   . Stomach cancer Neg Hx     Medications:   Current Outpatient Medications on File Prior to Visit  Medication Sig Dispense Refill  . amitriptyline (ELAVIL) 25 MG tablet TAKE 1 TABLET(25 MG) BY MOUTH AT BEDTIME 90 tablet 1  . BD INSULIN SYRINGE U/F 31G X 5/16" 1 ML MISC INJECT 4 TIMES DAILY SUBCUTANEOUSLY 100 each 3  . calcium-vitamin D (OSCAL WITH D) 500-200 MG-UNIT tablet Take 1 tablet by mouth.    . Cholecalciferol (VITAMIN D3 PO) Take 1 tablet by mouth daily.    Marland Kitchen diltiazem (CARDIZEM CD) 120 MG 24 hr capsule TAKE ONE CAPSULE BY MOUTH DAILY 90 capsule 1  . ELIQUIS 5 MG TABS tablet TAKE 1 TABLET BY MOUTH TWICE DAILY 180 tablet 1  . Lancets (ONETOUCH DELICA PLUS [id number]) MISC USE TO CHECK BLOOD SUGAR TWICE DAILY.. 200 each 0  . losartan (COZAAR) 50 MG tablet Take 1 tablet (50 mg total) by mouth daily. 90 tablet 3  . metFORMIN (GLUCOPHAGE) 500 MG tablet Take 500 mg by mouth 2 (two) times daily with a meal.     . Multiple Vitamin (MULITIVITAMIN WITH MINERALS) TABS Take 1 tablet by mouth daily.    . nitroGLYCERIN (NITROSTAT) 0.4 MG SL tablet Place 0.4 mg under the tongue every 5 (five) minutes as needed for chest pain.      Marland Kitchen NOVOLIN N 100 UNIT/ML injection Inject 35 Units into the skin daily before breakfast.    . NOVOLIN R 100 UNIT/ML injection 10 UNITS THIRTY MINUTES BEFORE MEALS.  5 ADDITIONAL UNITS WITH CARBS OR SNACKS.    [name] VERIO test strip USE TO MONITOR GLUCOSE LEVELS TWICE DAILY 200 strip 3  . SYNTHROID 125 MCG tablet Take 125 mcg by mouth every morning.     No current facility-administered medications on file prior to visit.    Allergies:   Allergies  Allergen Reactions  . Statins Other (See Comments)    Muscle aches     Physical Exam  Vitals:   06/22/20 0856  BP: 125/70  Pulse: 70  Weight: 184 lb (83.5 kg)  Height: 5' 5"  (1.651 m)   Body mass index is 30.62 kg/m. No exam data present   General: well developed, well nourished, pleasant elderly Caucasian female, seated, in no evident distress Head: head normocephalic and atraumatic.   Neck: supple with no carotid or supraclavicular bruits Cardiovascular: regular rate and rhythm, no murmurs Musculoskeletal: no deformity Skin:  no rash/petichiae Vascular:  Normal pulses all extremities   Neurologic Exam Mental Status: Awake and fully alert. Fluent speech and language. Oriented to place and time. Recent memory mildly impaired and remote memory intact.  Attention span, concentration and fund of knowledge mostly appropriate. Mood and affect appropriate.  Cranial Nerves: Pupils equal, briskly reactive to light. Extraocular movements full without nystagmus.  Visual fields full to confrontation. Hearing intact. Facial sensation intact.  Mild left lower facial weakness Motor: Normal bulk and tone.  Full strength in all tested extremities except slight decreased left hand dexterity Sensory.: intact to touch , pinprick , position and vibratory sensation.  Coordination: Rapid alternating movements normal in all extremities except slightly decreased left hand. Finger-to-nose showed slight left hand ataxia and heel-to-shin performed  accurately bilaterally.  Occasional left hand tremor but not consistently with action or at rest.  Negative for cogwheel rigidity or bradykinesia. Gait and Station: Arises from chair with mild difficulty. Stance is normal. Gait demonstrates  normal stride length and balance without use of assistive device. Able to heel toe with mild difficulty.  Romberg negative. Reflexes: 1+ and symmetric. Toes downgoing.       ASSESSMENT: Breanna Webster is a 75 y.o. year old female presented with increased shortness of breath and confusion x2 weeks along with headache on 10/21/2018 with recent admission on 10/03/2018 for sepsis and mitral valve endocarditis.  Stroke work-up revealed multifocal bilateral anterior and posterior infarcts cardioembolic pattern secondary to new onset A. fib versus recent endocarditis. Vascular risk factors include history of DVT, aortic stenosis status post TAVR 06/2018, pulmonary hypertension, HTN, DM, CAD status post stenting 03/2018, recent endocarditis, A. fib and new diagnosis of moderate OSA with initiation of BiPAP.     PLAN:  1. Hx multiple Cardiologic strokes including cerebellar and pontine stroke:  a. Residual deficits: mild LUE decreased dexterity with intermittent tremor and cognitive impairment.  i. Discussion again regarding intermittent tremor likely from prior stroke.  Symptoms do not interfere with daily activity or functioning therefore no indication to trial medications especially when risk outweighs benefit.  Discussed use of weighted wristband for possible benefit. b. Continue Eliquis (apixaban) daily  for secondary stroke prevention.   c. Discussed secondary stroke prevention measures and importance of close PCP/endocrinology follow-up for aggressive stroke risk factor management including HTN with BP goal<130/90 and DM with A1c goal<7 2. Atrial fibrillation: On Eliquis 5 mg twice daily routinely monitored by cardiology 3. Moderate OSA:  a. Sleep study  03/29/2020 AHI 12.8, severely altered highly fragmented sleep associated with sleep stages or arousals from  sleep, delayed onset REM sleep with REM sleep associated with continuous low oxygen saturation with total O2 desat time 70 minutes with SPO2 nadir 76%, and mild periodic limb movement disorder b. CPAP titration 04/21/2020 apnea unresponsive to hypopneas, apnea and desaturations but OSA responded partially to BiPAP 14/10 with central sleep apnea present with higher BiPAP pressures, sleep-related hypoxemia improved greatly on CPAP and BiPAP, presence of mild PLM confirmed c. Recommendation: Auto BiPAP with a 4cm pressure spread from 13/9 through 18/14 d. Discussed importance of nightly use as this will greatly reduce stroke risk factors, cardiovascular risk factors and also likely improve daytime fatigue   Schedule follow-up in 6 weeks for initial CPAP compliance visit Plan to f/u in 6 months after that time for stroke and CPAP compliance visit   CC:  [facility] provider: Dr. Kathleen Lime, Tommi Rumps, NP    I spent 40 minutes of face-to-face and non-face-to-face time with patient and husband.  This included previsit chart review, lab review, study review, order entry, electronic health record documentation, patient education regarding prior history of stroke and etiology, residual deficits, new diagnosis of sleep apnea and use of BiPAP machine and answered all other related questions, importance of managing stroke risk factors, and answered all other multiple questions to patient and husband satisfaction  [name], AGNP-BC  [facility] [address] [place], [place] [address]  Phone [phone] Fax [phone] Note: This document was prepared with digital dictation and possible smart phrase technology. Any transcriptional errors that result from this process are unintentional.

## 2020-06-23 NOTE — Progress Notes (Signed)
I agree with the above plan 

## 2020-06-25 ENCOUNTER — Other Ambulatory Visit: Payer: Self-pay | Admitting: Adult Health

## 2020-06-28 ENCOUNTER — Telehealth: Payer: Self-pay | Admitting: Adult Health

## 2020-06-28 NOTE — Telephone Encounter (Signed)
I think this medication is doing her good and would like to keep her on it. This medication is " not addictive" but if we ever decide to come off it, she will need a slow taper down as it can cause withdrawal symptoms

## 2020-06-28 NOTE — Telephone Encounter (Signed)
Pt call and stated she need a refill on Lexapro 10 mg and want to know why she had to call the office and want a call back pt want it sent to  [facility] [address] [place] Phone:  [phone]  Fax:  [phone]

## 2020-06-28 NOTE — Telephone Encounter (Signed)
At cardiology appt on 01/25/20 it looks like med was d/c saying pt wasn't taking it anymore. Called pt and she said she didn't realize that lexapro was escitalopram and that assistant may have said name brand and she didn't think she was taking it. Pt said she has been taking escitalopram this whole time. She asked if PCP thinks she needs to keep taking med and she asked if it's habit forming, I did advise her it doesn't look like doctor d/c med there was just a note on med saying "pt isn't taking med" and that's why it was d/c. Pt said she still wants to make sure PCP wants her on med and she ask again how addictive/ habit forming med is?   Please advise

## 2020-06-29 IMAGING — MR MR PELVIS W/O CM
4 of 5 series · 17 of 48 positions shown · non-contrast
Comparison: CT abdomen and pelvis 10/19/2011.

CLINICAL DATA: Bilateral hip pain for 2 years.  No known injury.

EXAM:
MR OF THE PELVIS WITHOUT CONTRAST
TECHNIQUE: Multiplanar, multisequence MR imaging was performed. No intravenous
contrast was administered.

[Series 3: STIR · coronal · 4.0mm · 0.78mm/px · 6 of 30 slices shown]
[im 1/30]
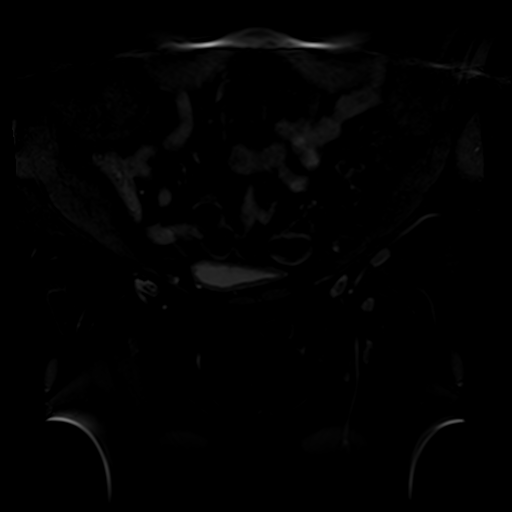
[im 6/30]
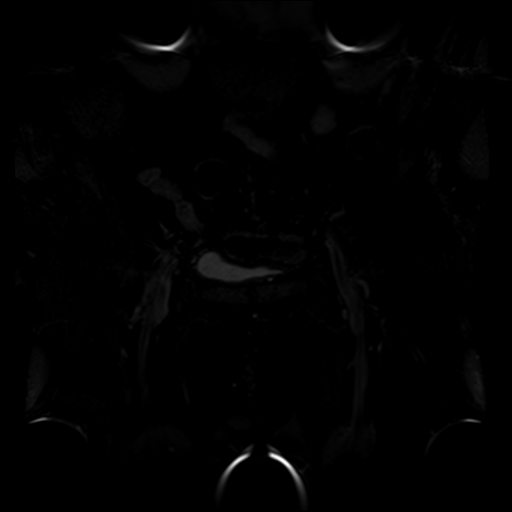
[im 12/30]
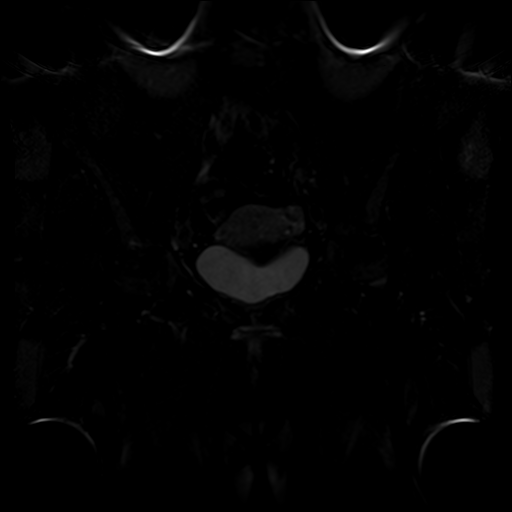
[im 18/30]
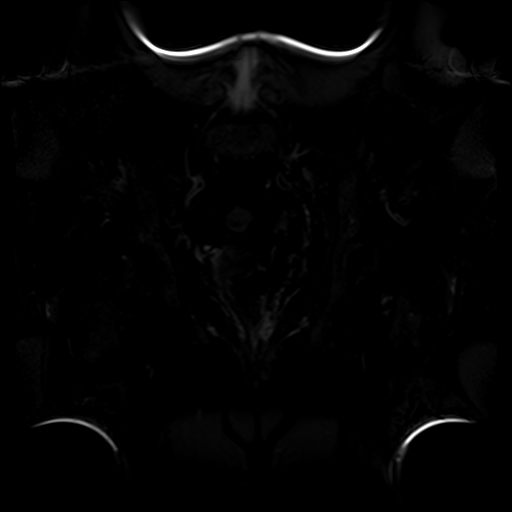
[im 24/30]
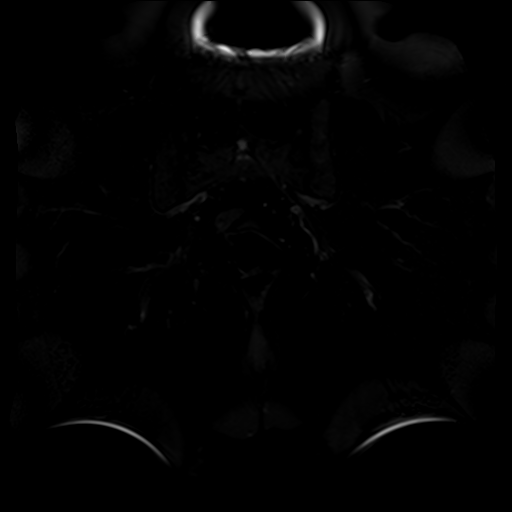
[im 30/30]
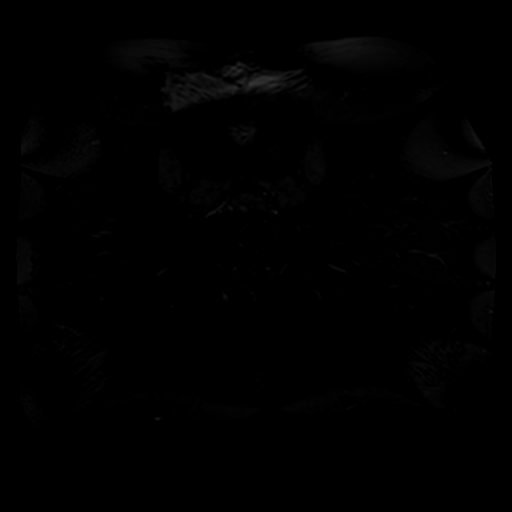

[Series 4: T1 · coronal · 4.0mm · 0.62mm/px · 5 of 30 slices shown (1 of 2)]
[im 1/30]
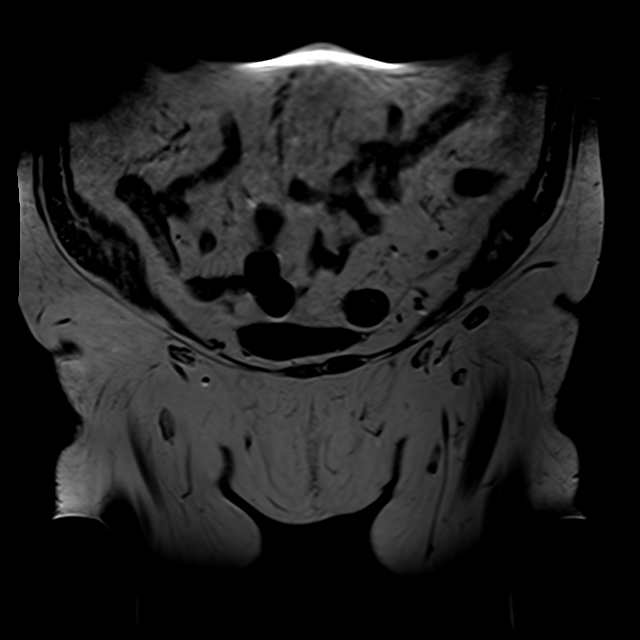
[im 6/30]
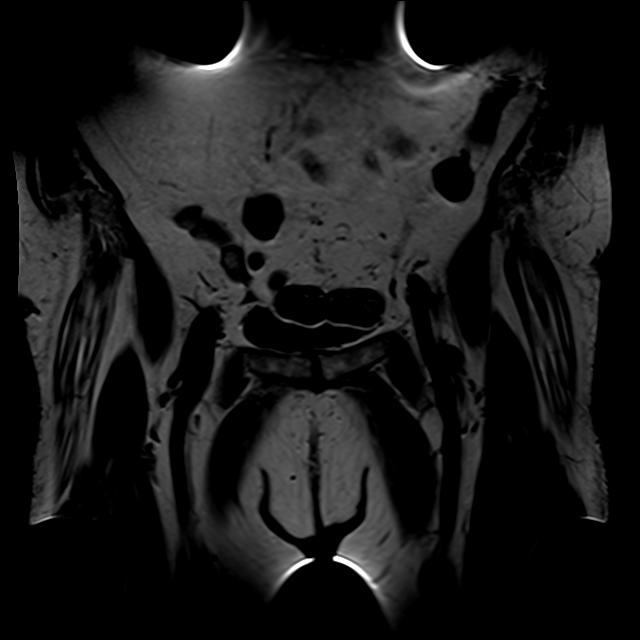
[im 12/30]
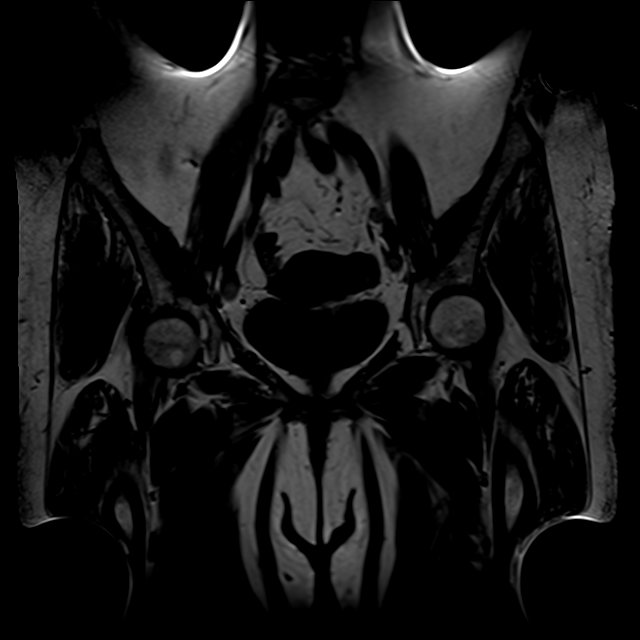
[im 18/30]
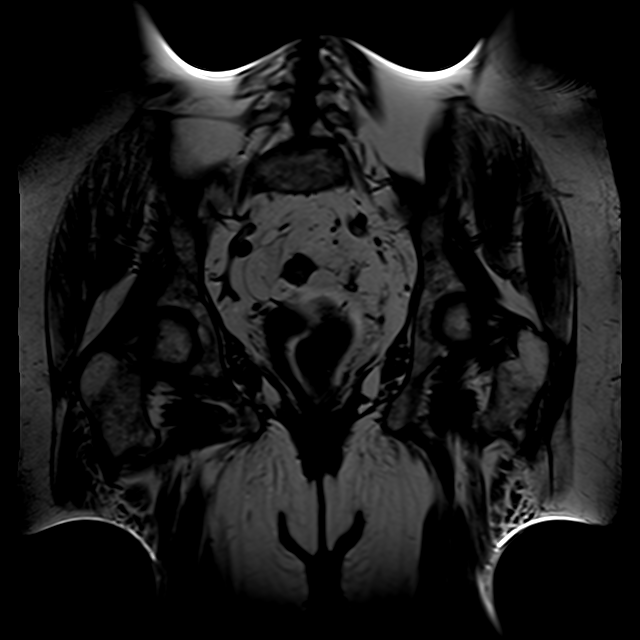
[im 30/30]
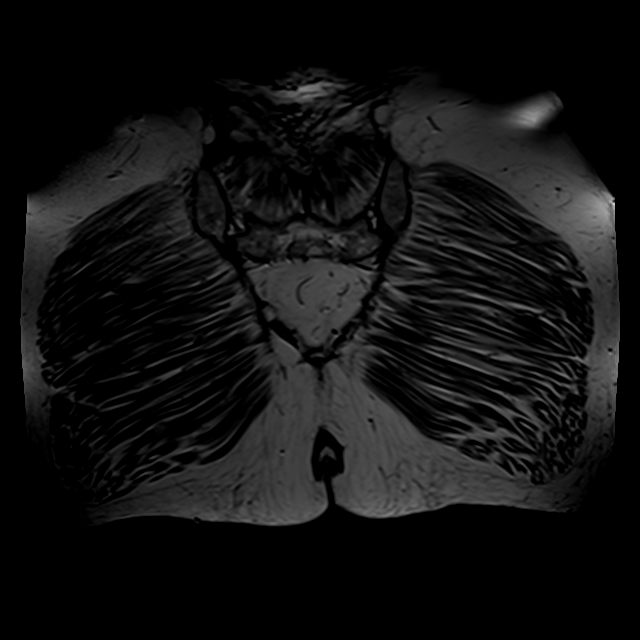

[Series 5: T1 · axial · 4.0mm · 0.64mm/px · z∈[-90,+110]mm · 3 of 50 slices shown (2 of 2)]
[im 5/50]
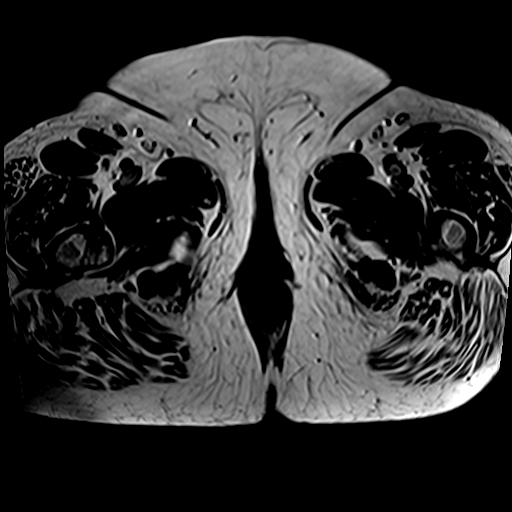
[im 25/50]
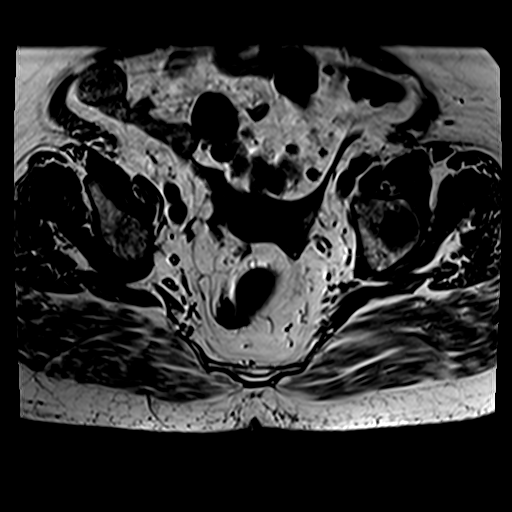
[im 45/50]
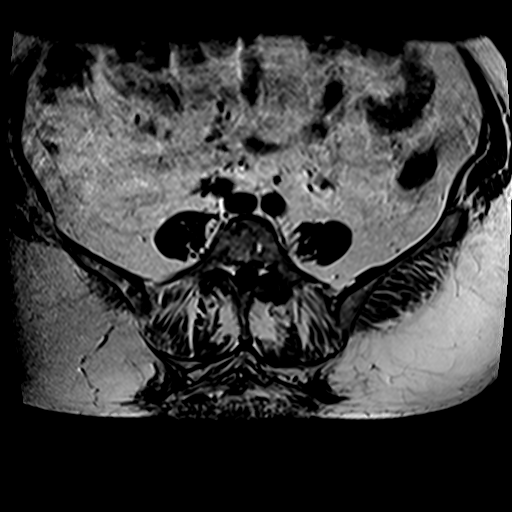

[Series 6: T2 fat-sat · axial · 4.0mm · 0.66mm/px · z∈[-90,+110]mm · 3 of 50 slices shown]
[im 5/50]
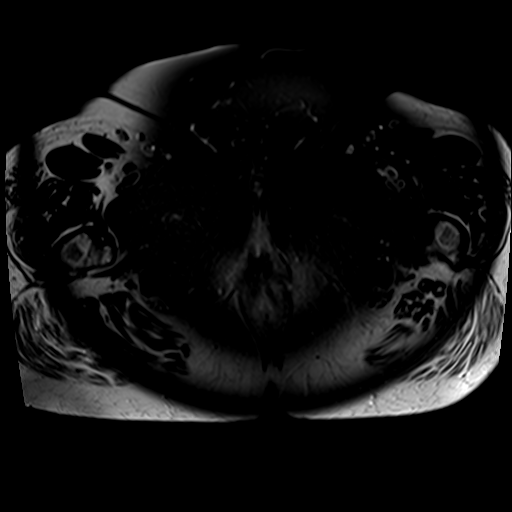
[im 25/50]
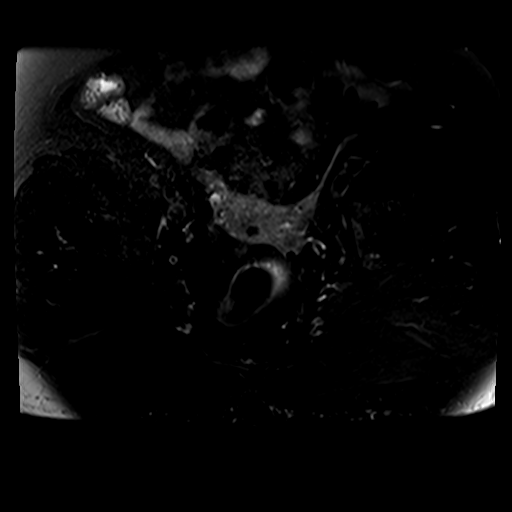
[im 45/50]
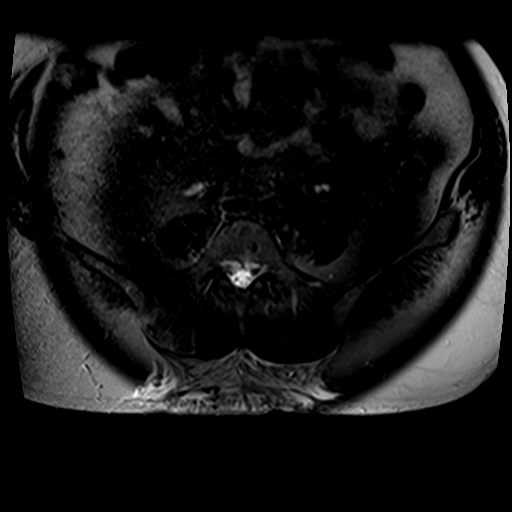

[17 of 48 positions shown; findings below may reference images not displayed]

FINDINGS: Bones: 2 tiny subchondral cysts are seen in the right acetabulum.
Bone marrow signal is otherwise normal throughout without fracture,
stress change or focal lesion. No avascular necrosis of the femoral
heads.

Articular cartilage and labrum

Articular cartilage: Minimal degenerative change is seen
bilaterally.

Labrum:  Intact

Joint or bursal effusion

Joint effusion:  None.

Bursae: Trace amount of fluid is present in the trochanteric bursa
bilaterally.

Muscles and tendons

Muscles and tendons: There is some increased T2 signal in the left
hamstrings at their origin compatible with mild tendinosis.
Otherwise negative.

Other findings

Miscellaneous: Imaged intrapelvic contents demonstrate sigmoid
diverticulosis.
IMPRESSION: Very mild bilateral hip degenerative disease.

Tiny amount of fluid in the trochanteric bursa bilaterally could be
due to bursitis.

Mild tendinosis of the left hamstrings at their origin without tear.

Sigmoid diverticulosis.

## 2020-06-29 NOTE — Telephone Encounter (Signed)
Left a detailed message with the information below at the pts cell number.

## 2020-07-01 ENCOUNTER — Other Ambulatory Visit: Payer: Self-pay | Admitting: Adult Health

## 2020-07-01 DIAGNOSIS — Z760 Encounter for issue of repeat prescription: Secondary | ICD-10-CM

## 2020-07-01 NOTE — Telephone Encounter (Signed)
Pt is calling in needing a refill on the following Rx's Lexapro 20 MG and Eliquis 5 MG stating that she is out of them and would like to see if they can be called in today.   Ph[facility]: [facility] on [address]

## 2020-07-01 NOTE — Telephone Encounter (Signed)
Patient is calling back and stated that she was out of the medication for Lexapro and is requesting a refill and for it to be sent to [id number] -  [address], [place] [address]  Phone:  [phone] Fax:  [phone] CB is [phone]

## 2020-07-01 NOTE — Telephone Encounter (Signed)
Rx has been sent in. 

## 2020-07-03 ENCOUNTER — Encounter: Payer: Self-pay | Admitting: Neurology

## 2020-07-25 ENCOUNTER — Other Ambulatory Visit: Payer: Self-pay | Admitting: Adult Health

## 2020-07-25 ENCOUNTER — Other Ambulatory Visit: Payer: Self-pay | Admitting: *Deleted

## 2020-07-25 DIAGNOSIS — E118 Type 2 diabetes mellitus with unspecified complications: Secondary | ICD-10-CM

## 2020-07-25 NOTE — Patient Outreach (Addendum)
[facility]) Care Management  07/25/2020  Breanna Webster Dec 08, 1945 [id number]  Unsuccessful outreach attempt made to patient. Patient answered the phone and stated that she would not be able to speak at this time, she actually was anticipating my call earlier. Nurse apologized for calling late and the patient verbalized understanding. She did request that this nurse call back at a later date.   Plan: RN Health Coach will call patient within the month of April.  [name] RN, BSN [name] [phone] [name]

## 2020-07-27 NOTE — Telephone Encounter (Signed)
Okay to refill? Pt has not been seen recently

## 2020-08-03 ENCOUNTER — Telehealth: Payer: Self-pay | Admitting: Hematology

## 2020-08-03 ENCOUNTER — Ambulatory Visit (INDEPENDENT_AMBULATORY_CARE_PROVIDER_SITE_OTHER): Payer: Medicare Other | Admitting: Neurology

## 2020-08-03 ENCOUNTER — Encounter: Payer: Self-pay | Admitting: Neurology

## 2020-08-03 VITALS — BP 130/72 | HR 87 | Ht 65.0 in | Wt 184.0 lb

## 2020-08-03 DIAGNOSIS — H532 Diplopia: Secondary | ICD-10-CM | POA: Diagnosis not present

## 2020-08-03 DIAGNOSIS — I63431 Cerebral infarction due to embolism of right posterior cerebral artery: Secondary | ICD-10-CM

## 2020-08-03 DIAGNOSIS — D588 Other specified hereditary hemolytic anemias: Secondary | ICD-10-CM

## 2020-08-03 DIAGNOSIS — D649 Anemia, unspecified: Secondary | ICD-10-CM

## 2020-08-03 DIAGNOSIS — G4733 Obstructive sleep apnea (adult) (pediatric): Secondary | ICD-10-CM | POA: Insufficient documentation

## 2020-08-03 DIAGNOSIS — G4739 Other sleep apnea: Secondary | ICD-10-CM | POA: Insufficient documentation

## 2020-08-03 DIAGNOSIS — G4731 Primary central sleep apnea: Secondary | ICD-10-CM | POA: Diagnosis not present

## 2020-08-03 HISTORY — DX: Cerebral infarction due to embolism of right posterior cerebral artery: I63.431

## 2020-08-03 NOTE — Progress Notes (Signed)
I agree with the above plan 

## 2020-08-03 NOTE — Telephone Encounter (Signed)
Left message with rescheduled upcoming appointment due to provider's PAL. Gave option to call back to reschedule if needed.

## 2020-08-03 NOTE — Progress Notes (Signed)
SLEEP MEDICINE CLINIC    Provider:  Larey Seat, MD  Primary Care Physician:  [name], NP [place] [address]     Referring Provider: Tamala Fothergill and [name], NP.         Chief Complaint according to patient   Patient presents with:    . New Patient (Initial Visit)       persistent d fatigue in a patient with well treated apnea - numerically and compliance results excellent.       HISTORY OF PRESENT ILLNESS:  Breanna Webster is a 75 year- old Caucasian female patient and here upon a consultation requested by the stroke service-  on 08/03/2020 .    The patient has tested positive for sleep apnea a PSG had been performed on 03-23-2020 and revealed mild to moderate obstruction with an AHI of 12.8 her sleep architecture however was severely altered very fragmented sleep was known and it was a great delay before she entered REM sleep or dream sleep.  During REM sleep she had no oxygen desaturation which may be a reason that her body avoids going there.  She also had a mild periodic limb movement disorder which was indicated by rhythmic twitching at night.  The wake baseline oxygen not to duration was 90% the lowest during the night was 76% oxygen saturation she appeared chronically oxygen desaturated during REM sleep for this reason she was invited back for a CPAP titration with a nasal pillow and then changed to a DreamWear full face mask and medium size.  She changed to the nasal pillow.  CPAP was advanced to 13 cm water pressure but could not control her apnea I changed to BiPAP was initiated with the best results however at a setting of 14/10 centimeters water.    She reports that she has continuously suffered from dry mouth and dry nose, she uses water and her water reservoir however.  She misses any effect on her fatigue resolution or any resolution of daytime sleepiness.  She endorsed 10 points on the Epworth Sleepiness Scale today and 61  point very high and the fatigue severity score.    I looked at her compliance Adapt-BiPAP report and is excellent.  She has used the machine 93% of the time with an average of 5 hours 49 minutes.  She has used an IPAP pressure of 18 with a minimum expiratory pressure of 9 and a pressure support of 4 cmH2O so she is now using not BiPAP but an adapt machine. AHI is 0.9/h . APNEA is not the cause of her fatigue anymore.  She reports her main concern is persistent fatigue, left hand tremor at rest and diplopia since stroke, she was treated at [facility].  I direct further evaluation to Dr Leonie Man as her primary neurologist. Could this be a vascular parkinsonism? There is no cogwheeling. DAT scan ? Marland Kitchen   She may have to speak to Dr Pernell Dupre, MD for his input into her fatigue, could it be cardiac.  Anemia with thalassemia minor can be cause of fatigue, too.     06/22/2020, Stroke service, Dr Leonie Man, MD,Breanna Webster returns for stroke follow-up accompanied by her husband. Reports continued mild left hand tremor with mild left hand weakness -stable without worsening Prior complaints of gait unsteadiness -reports great improvement after working with PT -she has been able to increase ambulation distance - per husband, able to walk 3-4 blocks without stopping where previously difficulty  ambulating very short distances She also reports occasional dizziness typically with position changes that last short duration.  She reports left hand tremors, increasing in frequency and amplitude.    She does report in December, she presented to the ED with acute onset right eye visual loss which lasted approximately 15 minutes and then completely resolved.  CTA head/neck unremarkable. Reports evaluation by ophthalmology with no concerning findings that may have possibly contributed.  She has not had any additional visual loss or visual symptoms   Chief concern according to patient :   Breanna Webster did suffer a stroke in  2020 and this affected the left cerebellum and left cerebrum. This home sleep test was prepared by [facility] electronically signed on 11 Sep 2018.  The patient had first developed a DVT, had a known aortic stenosis post TAVR on 3-20 20, hypothyroidism pulmonary hypertension hypertension diabetes mellitus coronary artery disease with stenting in December 2019, history of infectious endocarditis 10/11/2018 multifocal bilateral anterior posterior infarcts secondary to new onset atrial fibrillation in June 2020.  She developed double vision, imbalance dizziness and left hand shaking as a result.  She states that she had left hemiparesis and her left angle of her mouth was droopy.  It seems that during her hospitalization in May 2020 a research trial took place which involved her into a home sleep test.   This test was never fully interpreted by her physician, but no significant apnea was found based on this home sleep test from 04 Sep 2018 for AHI which stands for apnea hypopnea index was 4.2 and would not be enough to warrant any treatment or intervention. Breanna Webster describes that during her hospitalization she just could not sleep and she rate remained with problems of insomnia which were not attributed to apnea.  Over the last weeks or months this has shifted to an increasing degree of fatigue and daytime and she has been tired enough she stated, that she could sleep standing up in any situation now.  She endorsed today's Epworth Sleepiness Scale at 11 out of 24 points fatigue severity at 34 out of 63 points and geriatric depression scale 4 out of 15 points. Her husband has not noted apnea. Her son has OSA.    I have the pleasure of seeing Breanna Webster today, a right-handed White or Caucasian female without clinical evidence of apnea. may have another  sleep disorder.  She  has a past medical history of Anxiety, Arthritis, Back pain, Blood transfusion without reported diagnosis, CAD (coronary  artery disease), Carotid artery stenosis, Chest pain, Chronic lower back pain, Cirrhosis ([place]), Colon polyps, Diverticulitis, Diverticulosis, Esophageal thickening, Fatty liver, GERD (gastroesophageal reflux disease), Grave's disease, History of colonic polyps (05/22/2017), History of hiatal hernia, Hypertension, Hypothyroidism, IBS (irritable bowel syndrome), Osteopenia, Pulmonary nodules, S/P TAVR (transcatheter aortic valve replacement), Severe aortic stenosis, Shortness of breath on exertion, Stroke ([place]), Thalassemia minor, Thyroid disease, and Type II diabetes mellitus ([place]). Thalassemia minor, known anemia.   Sleep relevant medical history: Left weakness. Fatigue- Family medical /sleep history: Son on CPAP with OSA, CAD. 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  She lives in a household with her spouse, adult son.  Pets are  present. Tobacco use: none .  ETOH use; none ,  Caffeine intake in form of Coffee( 2 cups decaffeinated)  Regular exercise in form of walking, PT     Sleep habits are as follows: The patient's dinner time is between 7-8.30 PM.  The patient goes to bed at 12 PM and continues to sleep for intervals of 2-3 hours , wakes for 2 bathroom breaks, the first time at 3 AM.   The preferred sleep position is on her right side , with the support of 2 pillows, head raised because of GERD. Dreams are reportedly frequent/vivid. 10 AM is the usual rise time. The patient wakes up spontaneously at 9 AM.  She reports not feeling refreshed or restored in AM, with symptoms such as dry mouth and post nasal drip-, no morning headaches . There is always residual fatigue.  Naps are taken frequently, lasting from 1-3 hours and are more refreshing than nocturnal sleep.    Review of Systems: Out of a complete 14 system review, the patient complains of only the following symptoms, and all other reviewed systems are negative.:  Fatigue, sleepiness , snoring,  fragmented sleep, Insomnia / post stroke.    How likely are you to doze in the following situations: 0 = not likely, 1 = slight chance, 2 = moderate chance, 3 = high chance   Sitting and Reading? Watching Television? Sitting inactive in a public place (theater or meeting)? As a passenger in a car for an hour without a break? Lying down in the afternoon when circumstances permit? Sitting and talking to someone? Sitting quietly after lunch without alcohol? In a car, while stopped for a few minutes in traffic?   Total =10 / 24 points   FSS endorsed at 61 / 63 points.   Social History   Socioeconomic History  . Marital status: Married    Spouse name: Not on file  . Number of children: 2  . Years of education: Not on file  . Highest education level: Not on file  Occupational History  . Occupation: Tree surgeon of the [employer]  Tobacco Use  . Smoking status: Never Smoker  . Smokeless tobacco: Never Used  Vaping Use  . Vaping Use: Never used  Substance and Sexual Activity  . Alcohol use: No    Alcohol/week: 0.0 standard drinks  . Drug use: Never  . Sexual activity: Not Currently  Other Topics Concern  . Not on file  Social History Narrative   Lives with husband in a one story home.     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   Education: college.   Social Determinants of Health   Financial Resource Strain: Low Risk   . Difficulty of Paying Living Expenses: Not hard at all  Food Insecurity: No Food Insecurity  . Worried About Charity fundraiser in the Last Year: Never true  . Ran Out of Food in the Last Year: Never true  Transportation Needs: No Transportation Needs  . Lack of Transportation (Medical): No  . Lack of Transportation (Non-Medical): No  Physical Activity: Insufficiently Active  . Days of Exercise per Week: 2 days  . Minutes of Exercise per Session: 50 min  Stress: Not  on file  Social Connections: Socially Integrated  . Frequency of Communication with Friends and Family: More than three times a week  . Frequency of Social Gatherings with Friends and Family: Once a week  . Attends Religious Services: More than 4 times per year  . Active Member of Clubs or Organizations: Yes  . Attends Archivist Meetings: More than 4 times per year  . Marital Status: Married    Family History  Adopted: Yes  Problem Relation Age of Onset  . Healthy Son        x 2  . Headache Other        Cluster headaches  . Heart failure Mother   . Colon cancer Neg Hx   . Pancreatic cancer Neg Hx   . Rectal cancer Neg Hx   . Stomach cancer Neg Hx     Past Medical History:  Diagnosis Date  . Anxiety   . Arthritis    "back" (04/22/2018)  . Back pain   . Blood transfusion without reported diagnosis   . CAD (coronary artery disease)    a. 03/2018 s/p PCI/DES to the RCA (3.0x15 Onyx DES).  . Carotid artery stenosis    Mild  . Chest pain   . Chronic lower back pain   . Cirrhosis ([place])   . Colon polyps   . Diverticulitis   . Diverticulosis   . Esophageal thickening    seen on pre TAVR CT scan, also questionable cirrhosis. MRI recommended. Will refer to GI  . Fatty liver   . GERD (gastroesophageal reflux disease)   . Grave's disease   . History of colonic polyps 05/22/2017  . History of hiatal hernia   . Hypertension   . Hypothyroidism   . IBS (irritable bowel syndrome)   . Osteopenia   . Pulmonary nodules    seen on pre TAVR CT. likley benign. no follow up recommended if pt low risk.  . S/P TAVR (transcatheter aortic valve replacement)   . Severe aortic stenosis   . Shortness of breath on exertion   . Stroke ([place])   . Thalassemia minor   . Thyroid disease   . Type II diabetes mellitus ([place])     Past Surgical History:  Procedure Laterality Date  . [place] STUDY N/A 03/03/2018   Procedure: [place] STUDY;  Surgeon: Mauri Pole, MD;   Location: [facility];  Service: Endoscopy;  Laterality: N/A;  . COLONOSCOPY    . COLONOSCOPY W/ BIOPSIES AND POLYPECTOMY    . CORONARY ANGIOGRAPHY Right 04/21/2018   Procedure: CORONARY ANGIOGRAPHY (CATH LAB);  Surgeon: [name], MD;  Location: [place] [facility];  Service: Cardiovascular;  Laterality: Right;  . CORONARY STENT INTERVENTION N/A 04/22/2018   Procedure: CORONARY STENT INTERVENTION;  Surgeon: [name], MD;  Location: [place] CV LAB;  Service: Cardiovascular;  Laterality: N/A;  . DILATION AND CURETTAGE OF UTERUS    . ESOPHAGEAL MANOMETRY N/A 03/03/2018   Procedure: ESOPHAGEAL MANOMETRY (EM);  Surgeon: Mauri Pole, MD;  Location: [facility];  Service: Endoscopy;  Laterality: N/A;  . GASTRIC FUNDOPLICATION    . HERNIA REPAIR    . HYSTEROSCOPY     fibroids  . LAPAROSCOPIC CHOLECYSTECTOMY    . LAPAROSCOPY     fibroids  . NISSEN FUNDOPLICATION  2423N  . POLYPECTOMY    . RIGHT/LEFT HEART CATH AND CORONARY ANGIOGRAPHY N/A 02/20/2018   Procedure: RIGHT/LEFT HEART CATH AND CORONARY ANGIOGRAPHY;  Surgeon: [name], MD;  Location: [place] [facility] LAB;  Service: Cardiovascular;  Laterality: N/A;  . TEE WITHOUT CARDIOVERSION N/A 07/08/2018   Procedure: TRANSESOPHAGEAL ECHOCARDIOGRAM (TEE);  Surgeon: Burnell Blanks, MD;  Location: [place];  Service: Open Heart Surgery;  Laterality: N/A;  . TEE WITHOUT CARDIOVERSION  10/07/2018  . TEE WITHOUT CARDIOVERSION N/A 10/07/2018   Procedure: TRANSESOPHAGEAL ECHOCARDIOGRAM (TEE);  Surgeon: Jerline Pain, MD;  Location: [facility];  Service: Cardiovascular;  Laterality:  N/A;  . TONSILLECTOMY    . TRANSCATHETER AORTIC VALVE REPLACEMENT, TRANSFEMORAL N/A 07/08/2018   Procedure: TRANSCATHETER AORTIC VALVE REPLACEMENT, TRANSFEMORAL;  Surgeon: Burnell Blanks, MD;  Location: [place];  Service: Open Heart Surgery;  Laterality: N/A;     Current Outpatient Medications on File Prior to Visit  Medication Sig  Dispense Refill  . amitriptyline (ELAVIL) 25 MG tablet TAKE 1 TABLET(25 MG) BY MOUTH AT BEDTIME 90 tablet 1  . BD INSULIN SYRINGE U/F 31G X 5/16" 1 ML MISC FOUR TIMES A DAY UNDER THE SKIN 200 each 1  . calcium-vitamin D (OSCAL WITH D) 500-200 MG-UNIT tablet Take 1 tablet by mouth.    . Cholecalciferol (VITAMIN D3 PO) Take 1 tablet by mouth daily.    Marland Kitchen diltiazem (CARDIZEM CD) 120 MG 24 hr capsule TAKE ONE CAPSULE BY MOUTH DAILY 90 capsule 1  . ELIQUIS 5 MG TABS tablet TAKE ONE TABLET BY MOUTH TWICE A DAY 60 tablet 2  . Lancets (ONETOUCH DELICA PLUS [id number]) MISC USE TO CHECK BLOOD SUGAR TWICE DAILY.. 200 each 0  . losartan (COZAAR) 50 MG tablet Take 1 tablet (50 mg total) by mouth daily. 90 tablet 3  . metFORMIN (GLUCOPHAGE) 500 MG tablet Take 500 mg by mouth 2 (two) times daily with a meal.     . Multiple Vitamin (MULITIVITAMIN WITH MINERALS) TABS Take 1 tablet by mouth daily.    . nitroGLYCERIN (NITROSTAT) 0.4 MG SL tablet Place 0.4 mg under the tongue every 5 (five) minutes as needed for chest pain.     Marland Kitchen NOVOLIN N 100 UNIT/ML injection Inject 35 Units into the skin daily before breakfast.    . NOVOLIN R 100 UNIT/ML injection 10 UNITS THIRTY MINUTES BEFORE MEALS.  5 ADDITIONAL UNITS WITH CARBS OR SNACKS.    [name] VERIO test strip USE TO MONITOR GLUCOSE LEVELS TWICE DAILY 200 strip 3  . SYNTHROID 125 MCG tablet Take 125 mcg by mouth every morning.     No current facility-administered medications on file prior to visit.    Allergies  Allergen Reactions  . Statins Other (See Comments)    Muscle aches    Physical exam:  Today's Vitals   08/03/20 0942  BP: 130/72  Pulse: 87  Weight: 184 lb (83.5 kg)  Height: 5' 5"  (1.651 m)   Body mass index is 30.62 kg/m.   Wt Readings from Last 3 Encounters:  08/03/20 184 lb (83.5 kg)  06/22/20 184 lb (83.5 kg)  03/24/20 180 lb 14.2 oz (82.1 kg)     Ht Readings from Last 3 Encounters:  08/03/20 5' 5"  (1.651 m)  06/22/20 5' 5"   (1.651 m)  03/24/20 5' 5"  (1.651 m)      General: The patient is awake, alert and appears not in acute distress. The patient is well groomed. Head: Normocephalic, atraumatic. Neck is supple. Mallampati 2,  neck circumference: 16.5  inches .  Nasal airflow patent.  Retrognathia is not seen.  Dental status: intact.  Cardiovascular:  Regular rate and cardiac rhythm by pulse,  without distended neck veins. Respiratory: Lungs are clear to auscultation.  Skin:  Without evidence of ankle edema, or rash. Trunk: The patient's posture is erect.   Neurologic exam : The patient is awake and alert, oriented to place and time.   Memory subjective described as intact.  Attention span & concentration ability appears normal.  Speech is fluent,  without  dysarthria, dysphonia or aphasia.  Mood and affect are appropriate.  Cranial nerves: no loss of smell or taste reported  Pupils are equal and briskly reactive to light. Funduscopic exam .  Extraocular movements in vertical and horizontal planes were intact and without nystagmus. No Diplopia today. Horizontal diplopia was described.  Visual fields by finger perimetry are intact. Hearing was intact to soft voice and finger rubbing.    Facial sensation intact to fine touch.  Facial motor strength is symmetric and tongue and uvula move midline.  Neck ROM : rotation, tilt and flexion extension were normal for age and shoulder shrug was symmetrical.    Motor exam:  right biceps tone is normal, her right is waxy- elevated tone.  Notable tremor at rest in the left hand without cog wheeling, symmetric grip strength .   Sensory:  Fine touch, vibration were diminished in both feet, vibration is lost in both ankles.    Proprioception tested in the upper extremities was normal.   Coordination: Rapid alternating movements in the fingers/hands were of normal speed.  The Finger-to-nose maneuver was intact without evidence of ataxia, dysmetria or tremor.   Gait  and station: Patient could rise unassisted from a seated position, walked without assistive device.  Stance is of normal width/ base and the patient turned with steps.  Toe and heel walk were deferred.  Deep tendon reflexes: in the  upper and lower extremities are symmetric and intact.  Babinski response was deferred .     Dear Dr. Leonie Man,    After spending a total time of 45  minutes face to face and additional time for physical and neurologic examination, review of laboratory studies,  personal review of imaging studies, reports and results of other testing and review of referral information / records as far as provided in visit, I have established the following assessments:  1)  This patient had mild to moderate OSA.   Atrial fibrillation- she developed central apneas under CPAP, and we switched to BiPAP adapt and she uses this with good success.  AHI is 0.9/h . APNEA is not the cause of her fatigue anymore. I will add an ONO to BipAP.   2)   She has suffered multifocal embolic strokes- explaining her patchy deficits. eft hand tremor at rest and diplopia since stroke, she was treated at [facility].  I direct further evaluation to Dr Leonie Man as her primary neurologist. Could this be a vascular parkinsonism? There is no cogwheeling. DAT scan ? Marland Kitchen    3) She reports her main concern is persistent fatigue, She may have to speak to Dr Pernell Dupre, MD for his input into her fatigue, could it be cardiac.  4) Hematologist - Anemia with thalassemia minor can be cause of fatigue, too.      My Plan is to proceed with: primary neurologist:  Left hand tremor at rest and diplopia since stroke, she was treated at [facility].  I direct further evaluation to Dr Leonie Man as her primary neurologist. Could this be a vascular parkinsonism? There is no cogwheeling. DAT scan ? .     I would like to thank  Antony Contras, MD  and [name]. for allowing me to meet with  this pleasant patient.  I plan to not follow up  should her repeat sleep study be negative.   CC: I will share my notes with Dr. Leonie Man.   SLEEP clinic follow up yearly.   Electronically signed by: Larey Seat, MD 08/03/2020 9:57 AM  [facility] and [employer] Board certified  by The American Board of Sleep Medicine and Diplomate of the [employer] of Sleep Medicine. Board certified In Neurology through the [place], Fellow of the [employer] of Neurology. Medical Director of [employer].

## 2020-08-03 NOTE — Patient Instructions (Signed)

## 2020-08-06 LAB — CBC WITH DIFFERENTIAL/PLATELET
Basophils Absolute: 0.1 10*3/uL (ref 0.0–0.2)
Basos: 1 %
EOS (ABSOLUTE): 0.1 10*3/uL (ref 0.0–0.4)
Eos: 2 %
Hematocrit: 34.5 % (ref 34.0–46.6)
Hemoglobin: 11.1 g/dL (ref 11.1–15.9)
Immature Grans (Abs): 0 10*3/uL (ref 0.0–0.1)
Immature Granulocytes: 0 %
Lymphocytes Absolute: 1.4 10*3/uL (ref 0.7–3.1)
Lymphs: 23 %
MCH: 20.9 pg — ABNORMAL LOW (ref 26.6–33.0)
MCHC: 32.2 g/dL (ref 31.5–35.7)
MCV: 65 fL — ABNORMAL LOW (ref 79–97)
Monocytes Absolute: 0.4 10*3/uL (ref 0.1–0.9)
Monocytes: 7 %
Neutrophils Absolute: 4.2 10*3/uL (ref 1.4–7.0)
Neutrophils: 67 %
Platelets: 201 10*3/uL (ref 150–450)
RBC: 5.31 x10E6/uL — ABNORMAL HIGH (ref 3.77–5.28)
RDW: 15.6 % — ABNORMAL HIGH (ref 11.7–15.4)
WBC: 6.3 10*3/uL (ref 3.4–10.8)

## 2020-08-06 LAB — ACETYLCHOLINE RECEPTOR AB, ALL
AChR Binding Ab, Serum: 0.06 nmol/L (ref 0.00–0.24)
Acetylchol Block Ab: 12 % (ref 0–25)

## 2020-08-08 ENCOUNTER — Encounter: Payer: Self-pay | Admitting: Neurology

## 2020-08-08 NOTE — Progress Notes (Signed)
For patient's baseline, normal RBC. Negative myasthenia Ab.

## 2020-08-12 ENCOUNTER — Inpatient Hospital Stay: Payer: BLUE CROSS/BLUE SHIELD

## 2020-08-12 ENCOUNTER — Inpatient Hospital Stay: Payer: BLUE CROSS/BLUE SHIELD | Admitting: Hematology

## 2020-08-15 ENCOUNTER — Other Ambulatory Visit: Payer: Self-pay | Admitting: *Deleted

## 2020-08-15 DIAGNOSIS — E039 Hypothyroidism, unspecified: Secondary | ICD-10-CM | POA: Diagnosis not present

## 2020-08-15 NOTE — Patient Outreach (Addendum)
[facility]) Care Management  08/15/2020  Breanna Webster 11/10/1945 [id number]  Unsuccessful outreach attempt made to patient. RN Health Coach left HIPAA compliant voicemail message along with her contact information.  Plan: RN Health Coach will call patient within the month of May.  [name] RN, BSN [place] [phone] [email or web address]

## 2020-08-25 DIAGNOSIS — Z6831 Body mass index (BMI) 31.0-31.9, adult: Secondary | ICD-10-CM | POA: Diagnosis not present

## 2020-08-25 DIAGNOSIS — Z794 Long term (current) use of insulin: Secondary | ICD-10-CM | POA: Diagnosis not present

## 2020-08-25 DIAGNOSIS — Z952 Presence of prosthetic heart valve: Secondary | ICD-10-CM | POA: Diagnosis not present

## 2020-08-25 DIAGNOSIS — E039 Hypothyroidism, unspecified: Secondary | ICD-10-CM | POA: Diagnosis not present

## 2020-08-25 DIAGNOSIS — M1612 Unilateral primary osteoarthritis, left hip: Secondary | ICD-10-CM | POA: Diagnosis not present

## 2020-08-25 DIAGNOSIS — E1149 Type 2 diabetes mellitus with other diabetic neurological complication: Secondary | ICD-10-CM | POA: Diagnosis not present

## 2020-08-25 DIAGNOSIS — I4891 Unspecified atrial fibrillation: Secondary | ICD-10-CM | POA: Diagnosis not present

## 2020-08-25 DIAGNOSIS — M545 Low back pain, unspecified: Secondary | ICD-10-CM | POA: Diagnosis not present

## 2020-08-25 DIAGNOSIS — Z8673 Personal history of transient ischemic attack (TIA), and cerebral infarction without residual deficits: Secondary | ICD-10-CM | POA: Diagnosis not present

## 2020-08-25 DIAGNOSIS — M1712 Unilateral primary osteoarthritis, left knee: Secondary | ICD-10-CM | POA: Diagnosis not present

## 2020-08-25 DIAGNOSIS — I2510 Atherosclerotic heart disease of native coronary artery without angina pectoris: Secondary | ICD-10-CM | POA: Diagnosis not present

## 2020-08-26 ENCOUNTER — Other Ambulatory Visit: Payer: Self-pay

## 2020-08-29 ENCOUNTER — Telehealth: Payer: Self-pay | Admitting: Adult Health

## 2020-08-29 DIAGNOSIS — H35033 Hypertensive retinopathy, bilateral: Secondary | ICD-10-CM | POA: Diagnosis not present

## 2020-08-29 DIAGNOSIS — H2513 Age-related nuclear cataract, bilateral: Secondary | ICD-10-CM | POA: Diagnosis not present

## 2020-08-29 DIAGNOSIS — I10 Essential (primary) hypertension: Secondary | ICD-10-CM | POA: Diagnosis not present

## 2020-08-29 DIAGNOSIS — H04123 Dry eye syndrome of bilateral lacrimal glands: Secondary | ICD-10-CM | POA: Diagnosis not present

## 2020-08-29 LAB — HM DIABETES EYE EXAM

## 2020-08-29 NOTE — Telephone Encounter (Signed)
Husband states pharmacy has reached out to [name] 3 times with no response for a refill on her Nitroglycerin. Please call pt to let them know it has been called in.   Pharmacy- [facility]

## 2020-08-30 MED ORDER — NITROGLYCERIN 0.4 MG SL SUBL
0.4000 mg | SUBLINGUAL_TABLET | SUBLINGUAL | 3 refills | Status: DC | PRN
  Filled 2021-06-20: qty 25, 7d supply, fill #0

## 2020-08-30 NOTE — Telephone Encounter (Signed)
Patient is requesting a refill on her Nitroglycerin.

## 2020-08-30 NOTE — Telephone Encounter (Signed)
Called pt and left message letting her know that refills have been sent to her pharmacy.

## 2020-08-31 ENCOUNTER — Telehealth: Payer: Self-pay | Admitting: Hematology

## 2020-08-31 NOTE — Telephone Encounter (Signed)
R/s appt per 5/11 sch msg. Called pt, no answer. Left msg with appt date and time.

## 2020-09-01 ENCOUNTER — Inpatient Hospital Stay: Payer: Medicare Other

## 2020-09-01 ENCOUNTER — Inpatient Hospital Stay: Payer: Medicare Other | Admitting: Hematology

## 2020-09-04 ENCOUNTER — Encounter: Payer: Self-pay | Admitting: Adult Health

## 2020-09-05 DIAGNOSIS — M1612 Unilateral primary osteoarthritis, left hip: Secondary | ICD-10-CM | POA: Diagnosis not present

## 2020-09-06 NOTE — Progress Notes (Signed)
HEMATOLOGY/ONCOLOGY CONSULTATION NOTE  Date of Service: 09/07/2020  Patient Care Team: [name], NP as PCP - General (Family Medicine) [name], MD as PCP - Cardiology (Cardiology) Madelin Rear, MD as Consulting Physician (Endocrinology) Laretta Alstrom, [name], RN as [place] [facility] [place], [place], [facility] as Pharmacist (Pharmacist)  REFERRING PHYSICIAN: [name], NP  CHIEF COMPLAINTS/PURPOSE OF CONSULTATION:   Thalassemia minor IDA   HISTORY OF PRESENTING ILLNESS:   Breanna Webster is a wonderful 75 y.o. female who has been referred to [name] by [name], NP for evaluation and management of Thalassemia minor. She is accompanied today by her husband. The pt reports that she is doing well overall.   The pt reports On 04/22/2018 she had Cardiac catheterization. The   07/08/2018- Infection around the heart, endocarditis.  Mitral valve infections.  She had a stroke in April 2020.   She gets shortness of breath when walking more than 50 steps. Before April 2020 she was very active. She is also having difficulty with balance.  Doctors are unsure as to why she is having difficulty breathing.  She has had radiation treatment to half of her thyroid.  She has not noticed any improvement since her valve placement  She has never had IV transfusions   She use to take B12 shots in her 67s. She is unsure why they were stopped. Possibly stopped because tests showed she no longer needed B12.   She reports that she was diagnosed with a fatty liver.  She is taking synthroid 125 MCG and eliquis.    Most recent lab results (04/28/2018) of CBC is as follows: all values are WNL except for RBC at 5.44, Hemoglobin at 9.0, Hct at 29.8, MCV at 54.8, RDW at 19.8, Monocytes Relative 2.0, ALT at 39, INR at 1.6, Prothrombin Time at 18.4.  On review of systems, pt reports shortness of breath when walking or being active, fatigue, steady weight, and denies  chest pain, leg swelling and any other symptoms.   On PMHx the pt reports liver cirrhosis, diagnosed with Thalassemia minor 20 years ago. Diabetes.   On Social Hx the pt reports never smoked. Never consumed alcohol.   On Family Hx the pt reports son has Thalassemia minor.  INTERVAL HISTORY:  Breanna Webster is a 75 y.o. female here for evaluation and management of Thalassemia minor. The patient's last visit with [name] was on 02/12/2020. The pt reports that she is doing well overall. We are joined today by her husband.  The pt reports that she is needing to have  A knee and hip operation. She does not have a scheduled date for this due to the demand of the surgery and clearances need for this from all of her doctors. The pt notes she currently has a UTI that she will be getting antibiotics for. She notes that she is getting cortisone injections in her knee and hip for the pain. The pt notes she has no energy and is very fatigued. She is unsure what she is deficient in but notes this fatigue is not her baseline. She denies taking the iron due to complications a long time ago. She currently takes Vitamin D3 and a multivitamin daily. She takes around a 2-3 hour nap daily. The pt notes this is her fourth UTI this year. The pt denies that this current lethargy is depression. She notes her diabetes is well controlled with a Hgb of 6.2. The pt notes that she is dealing  with urge incontinence, going to the bathroom around 10x daily.  Lab results today 09/07/2020 of CBC w/diff and CMP is as follows: all values are WNL except for WBC of 19.0K, RBC of 5.40, Hgb of 11.1, HCT of 35.1, MCV of 65.0, MCH of 20.6, nRBC of 0.3, Neutro Abs of 17.0K, Lymphs Abs of 0.6K, Monocytes Abs of 1.3K, Abs Immature Granulocytes of 0.10K, CO2 of 21, Glucose of 264, BUN of 27, Calcium of 10.4, ALT of 54, Total Bilirubin of 1.3. 09/07/2020 Ferritin of 351. 09/07/2020 Iron of 74, Sat Ratio of 26.  On review of systems, pt reports  fatigue, recurrent UTI, hip and knee pain, stress, lethargy and denies fevers, chills, leg swelling, decreased appetite, abdominal pain, and any other symptoms.   MEDICAL HISTORY:  Past Medical History:  Diagnosis Date  . Anxiety   . Arthritis    "back" (04/22/2018)  . Back pain   . Blood transfusion without reported diagnosis   . CAD (coronary artery disease)    a. 03/2018 s/p PCI/DES to the RCA (3.0x15 Onyx DES).  . Carotid artery stenosis    Mild  . Chest pain   . Chronic lower back pain   . Cirrhosis ([place])   . Colon polyps   . Diverticulitis   . Diverticulosis   . Esophageal thickening    seen on pre TAVR CT scan, also questionable cirrhosis. MRI recommended. Will refer to GI  . Fatty liver   . GERD (gastroesophageal reflux disease)   . Grave's disease   . History of colonic polyps 05/22/2017  . History of hiatal hernia   . Hypertension   . Hypothyroidism   . IBS (irritable bowel syndrome)   . Osteopenia   . Pulmonary nodules    seen on pre TAVR CT. likley benign. no follow up recommended if pt low risk.  . S/P TAVR (transcatheter aortic valve replacement)   . Severe aortic stenosis   . Shortness of breath on exertion   . Stroke ([place])   . Thalassemia minor   . Thyroid disease   . Type II diabetes mellitus ([place])      SURGICAL HISTORY: Past Surgical History:  Procedure Laterality Date  . [place] STUDY N/A 03/03/2018   Procedure: [place] STUDY;  Surgeon: Mauri Pole, MD;  Location: [facility];  Service: Endoscopy;  Laterality: N/A;  . COLONOSCOPY    . COLONOSCOPY W/ BIOPSIES AND POLYPECTOMY    . CORONARY ANGIOGRAPHY Right 04/21/2018   Procedure: CORONARY ANGIOGRAPHY (CATH LAB);  Surgeon: [name], MD;  Location: [facility];  Service: Cardiovascular;  Laterality: Right;  . CORONARY STENT INTERVENTION N/A 04/22/2018   Procedure: CORONARY STENT INTERVENTION;  Surgeon: [name], MD;  Location: [facility] LAB;  Service:  Cardiovascular;  Laterality: N/A;  . DILATION AND CURETTAGE OF UTERUS    . ESOPHAGEAL MANOMETRY N/A 03/03/2018   Procedure: ESOPHAGEAL MANOMETRY (EM);  Surgeon: Mauri Pole, MD;  Location: [facility];  Service: Endoscopy;  Laterality: N/A;  . GASTRIC FUNDOPLICATION    . HERNIA REPAIR    . HYSTEROSCOPY     fibroids  . LAPAROSCOPIC CHOLECYSTECTOMY    . LAPAROSCOPY     fibroids  . NISSEN FUNDOPLICATION  8280K  . POLYPECTOMY    . RIGHT/LEFT HEART CATH AND CORONARY ANGIOGRAPHY N/A 02/20/2018   Procedure: RIGHT/LEFT HEART CATH AND CORONARY ANGIOGRAPHY;  Surgeon: [name], MD;  Location: [place] [facility] LAB;  Service: Cardiovascular;  Laterality: N/A;  . TEE WITHOUT CARDIOVERSION N/A 07/08/2018   Procedure: TRANSESOPHAGEAL ECHOCARDIOGRAM (TEE);  Surgeon: Burnell Blanks, MD;  Location: [facility];  Service: Open Heart Surgery;  Laterality: N/A;  . TEE WITHOUT CARDIOVERSION  10/07/2018  . TEE WITHOUT CARDIOVERSION N/A 10/07/2018   Procedure: TRANSESOPHAGEAL ECHOCARDIOGRAM (TEE);  Surgeon: Jerline Pain, MD;  Location[facility];  Service: Cardiovascular;  Laterality: N/A;  . TONSILLECTOMY    . TRANSCATHETER AORTIC VALVE REPLACEMENT, TRANSFEMORAL N/A 07/08/2018   Procedure: TRANSCATHETER AORTIC VALVE REPLACEMENT, TRANSFEMORAL;  Surgeon: Burnell Blanks, MD;  Location: [facility];  Service: Open Heart Surgery;  Laterality: N/A;     SOCIAL HISTORY: Social History   Socioeconomic History  . Marital status: Married    Spouse name: Not on file  . Number of children: 2  . Years of education: Not on file  . Highest education level: Not on file  Occupational History  . Occupation: Tree surgeon of the [employer]  Tobacco Use  . Smoking status: Never Smoker  . Smokeless tobacco: Never Used  Vaping Use  . Vaping Use: Never used  Substance and Sexual Activity  . Alcohol use: No    Alcohol/week: 0.0 standard drinks  . Drug use: Never  . Sexual activity: Not  Currently  Other Topics Concern  . Not on file  Social History Narrative   Lives with husband in a one story home.     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   Education: college.   Social Determinants of Health   Financial Resource Strain: Low Risk   . Difficulty of Paying Living Expenses: Not hard at all  Food Insecurity: No Food Insecurity  . Worried About Charity fundraiser in the Last Year: Never true  . Ran Out of Food in the Last Year: Never true  Transportation Needs: No Transportation Needs  . Lack of Transportation (Medical): No  . Lack of Transportation (Non-Medical): No  Physical Activity: Insufficiently Active  . Days of Exercise per Week: 2 days  . Minutes of Exercise per Session: 50 min  Stress: Not on file  Social Connections: Socially Integrated  . Frequency of Communication with Friends and Family: More than three times a week  . Frequency of Social Gatherings with Friends and Family: Once a week  . Attends Religious Services: More than 4 times per year  . Active Member of Clubs or Organizations: Yes  . Attends Archivist Meetings: More than 4 times per year  . Marital Status: Married  Human resources officer Violence: Not At Risk  . Fear of Current or Ex-Partner: No  . Emotionally Abused: No  . Physically Abused: No  . Sexually Abused: No     FAMILY HISTORY: Family History  Adopted: Yes  Problem Relation Age of Onset  . Healthy Son        x 2  . Headache Other        Cluster headaches  . Heart failure Mother   . Colon cancer Neg Hx   . Pancreatic cancer Neg Hx   . Rectal cancer Neg Hx   . Stomach cancer Neg Hx    ALLERGIES:   is allergic to statins.   MEDICATIONS:  Current Outpatient Medications  Medication Sig Dispense Refill  . amitriptyline (ELAVIL) 25 MG tablet TAKE 1 TABLET(25 MG) BY MOUTH AT BEDTIME 90 tablet 1  . BD INSULIN SYRINGE U/F 31G X  5/16" 1 ML MISC  FOUR TIMES A DAY UNDER THE SKIN 200 each 1  . calcium-vitamin D (OSCAL WITH D) 500-200 MG-UNIT tablet Take 1 tablet by mouth.    . Cholecalciferol (VITAMIN D3 PO) Take 1 tablet by mouth daily.    Marland Kitchen diltiazem (CARDIZEM CD) 120 MG 24 hr capsule TAKE ONE CAPSULE BY MOUTH DAILY 90 capsule 1  . ELIQUIS 5 MG TABS tablet TAKE ONE TABLET BY MOUTH TWICE A DAY 60 tablet 2  . Lancets (ONETOUCH DELICA PLUS [id number]) MISC USE TO CHECK BLOOD SUGAR TWICE DAILY.. 200 each 0  . losartan (COZAAR) 50 MG tablet Take 1 tablet (50 mg total) by mouth daily. 90 tablet 3  . metFORMIN (GLUCOPHAGE) 500 MG tablet Take 500 mg by mouth 2 (two) times daily with a meal.     . Multiple Vitamin (MULITIVITAMIN WITH MINERALS) TABS Take 1 tablet by mouth daily.    . nitroGLYCERIN (NITROSTAT) 0.4 MG SL tablet Place 1 tablet (0.4 mg total) under the tongue every 5 (five) minutes as needed for chest pain. 25 tablet 3  . NOVOLIN N 100 UNIT/ML injection Inject 35 Units into the skin daily before breakfast.    . NOVOLIN R 100 UNIT/ML injection 10 UNITS THIRTY MINUTES BEFORE MEALS.  5 ADDITIONAL UNITS WITH CARBS OR SNACKS.    [name] VERIO test strip USE TO MONITOR GLUCOSE LEVELS TWICE DAILY 200 strip 3  . SYNTHROID 125 MCG tablet Take 125 mcg by mouth every morning.     No current facility-administered medications for this visit.     REVIEW OF SYSTEMS:   10 Point review of Systems was done is negative except as noted above.  PHYSICAL EXAMINATION: Vitals:   09/07/20 1003  BP: (!) 120/50  Pulse: 82  Resp: 18  Temp: 97.8 F (36.6 C)  SpO2: 100%   Wt Readings from Last 3 Encounters:  09/07/20 181 lb 6.4 oz (82.3 kg)  08/03/20 184 lb (83.5 kg)  06/22/20 184 lb (83.5 kg)   Body mass index is 30.19 kg/m.   Exam was given in a chair.   GENERAL:alert, in no acute distress and comfortable SKIN: no acute rashes, no significant lesions EYES: conjunctiva are pink and non-injected, sclera anicteric OROPHARYNX: MMM, no  exudates, no oropharyngeal erythema or ulceration NECK: supple, no JVD LYMPH:  no palpable lymphadenopathy in the cervical, axillary or inguinal regions LUNGS: clear to auscultation b/l with normal respiratory effort HEART: regular rate & rhythm ABDOMEN:  normoactive bowel sounds , non tender, not distended. Extremity: no pedal edema PSYCH: alert & oriented x 3 with fluent speech NEURO: no focal motor/sensory deficits   LABORATORY DATA:  I have reviewed the data as listed  CBC Latest Ref Rng & Units 09/07/2020 08/03/2020 04/19/2020  WBC 4.0 - 10.5 K/uL 19.0(H) 6.3 -  Hemoglobin 12.0 - 15.0 g/dL 11.1(L) 11.1 12.6  Hematocrit 36.0 - 46.0 % 35.1(L) 34.5 37.0  Platelets 150 - 400 K/uL 163 201 -   . CBC    Component Value Date/Time   WBC 19.0 (H) 09/07/2020 0920   RBC 5.40 (H) 09/07/2020 0920   HGB 11.1 (L) 09/07/2020 0920   HGB 11.1 08/03/2020 1100   HCT 35.1 (L) 09/07/2020 0920   HCT 34.5 08/03/2020 1100   PLT 163 09/07/2020 0920   PLT 201 08/03/2020 1100   MCV 65.0 (L) 09/07/2020 0920   MCV 65 (L) 08/03/2020 1100   MCH 20.6 (L) 09/07/2020 0920   MCHC 31.6 09/07/2020 0920  RDW 15.3 09/07/2020 0920   RDW 15.6 (H) 08/03/2020 1100   LYMPHSABS 0.6 (L) 09/07/2020 0920   LYMPHSABS 1.4 08/03/2020 1100   MONOABS 1.3 (H) 09/07/2020 0920   EOSABS 0.0 09/07/2020 0920   EOSABS 0.1 08/03/2020 1100   BASOSABS 0.0 09/07/2020 0920   BASOSABS 0.1 08/03/2020 1100   . CMP Latest Ref Rng & Units 09/07/2020 05/05/2020 04/19/2020  Glucose 70 - 99 mg/dL 264(H) - 166(H)  BUN 8 - 23 mg/dL 27(H) 22(A) 21  Creatinine 0.44 - 1.00 mg/dL 0.83 0.8 0.60  Sodium 135 - 145 mmol/L 136 143 140  Potassium 3.5 - 5.1 mmol/L 4.3 4.5 4.3  Chloride 98 - 111 mmol/L 104 103 103  CO2 22 - 32 mmol/L 21(L) 30(A) -  Calcium 8.9 - 10.3 mg/dL 10.4(H) 10.1 -  Total Protein 6.5 - 8.1 g/dL 7.2 - -  Total Bilirubin 0.3 - 1.2 mg/dL 1.3(H) - -  Alkaline Phos 38 - 126 U/L 77 81 -  AST 15 - 41 U/L 23 28 -  ALT 0 - 44  U/L 54(H) 53(A) -   . Lab Results  Component Value Date   IRON 74 09/07/2020   TIBC 290 09/07/2020   IRONPCTSAT 26 09/07/2020   (Iron and TIBC)  Lab Results  Component Value Date   FERRITIN 351 (H) 09/07/2020   Component     Latest Ref Rng & Units 04/30/2019  IgG (Immunoglobin G), Serum     586 - 1,602 mg/dL 1,299  IgA     64 - 422 mg/dL 342  IgM (Immunoglobulin M), Srm     26 - 217 mg/dL 108  Total Protein ELP     6.0 - 8.5 g/dL 7.0  Albumin SerPl Elph-Mcnc     2.9 - 4.4 g/dL 3.5  Alpha 1     0.0 - 0.4 g/dL 0.2  Alpha2 Glob SerPl Elph-Mcnc     0.4 - 1.0 g/dL 0.7  B-Globulin SerPl Elph-Mcnc     0.7 - 1.3 g/dL 1.3  Gamma Glob SerPl Elph-Mcnc     0.4 - 1.8 g/dL 1.3  M Protein SerPl Elph-Mcnc     Not Observed g/dL Not Observed  Globulin, Total     2.2 - 3.9 g/dL 3.5  Albumin/Glob SerPl     0.7 - 1.7 1.1  IFE 1      Comment  Please Note (HCV):      Comment  Hgb A2 Quant     1.8 - 3.2 % 4.1 (H)  Hgb F Quant     0.0 - 2.0 % 0.0  Hgb S Quant     0.0 % 0.0  HGB C     0.0 % 0.0  Hgb A     96.4 - 98.8 % 95.9 (L)  HGB VARIANT     0.0 % 0.0  Please Note:      Comment  Iron     41 - 142 ug/dL 27 (L)  TIBC     236 - 444 ug/dL 485 (H)  Saturation Ratios     21 - 57 % 6 (L)  UIBC     120 - 384 ug/dL 458 (H)  Folate, Hemolysate     Not Estab. ng/mL 463.0  HCT     34.0 - 46.6 % 31.6 (L)  Folate, RBC     >498 ng/mL 1,465  Haptoglobin     42 - 346 mg/dL 65  LDH     98 - 192 U/L  194 (H)  T4,Free(Direct)     0.61 - 1.12 ng/dL 0.89  TSH     0.308 - 3.960 uIU/mL 5.935 (H)  Vitamin B12     180 - 914 pg/mL 575  Ferritin     11 - 307 ng/mL 10 (L)    RADIOGRAPHIC STUDIES: I have personally reviewed the radiological images as listed and agreed with the findings in the report. No results found.   ASSESSMENT & PLAN:   Breanna Webster is a 75 y.o. female with:  1. Moderate Microcytic Anemia 2. H/o Beta thalassemia minor with baseline hgb likely in the  10-11 range 3. Iron deficiency   PLAN: -Discussed pt labwork today, 09/07/2020; not anemic. WBC elevated and sugars elevated due to current UTI. Iron labs normal. Mild dehydration. -Advised pt that elevated blood sugars is most likely due to recent steroids and infection. This is causing some mild dehydration. -Advised pt that anemia should not be playing a role in her fatigue. Her Hgb is stable and not affecting her energy levels. -Advised pt that low thyroid levels can affect her fatigue as well and muscle function. -Advised pt that suboptimally treated sleep apnea can cause fatigue. Recommended repeat sleep study if not working well for the pt. -Discussed pt's fatigue and lethargy. Recommended pt try to optimize all ongoing medical issues with PCP, Endocrinologist, Cardiologist, Neurologist, and other doctors. -Recommended pt take Vitamin B-Complex daily. -Recommend pt f/u with Neurology for tremor management  -Will see back in 12 months with labs.    FOLLOW UP: RTC with Dr Irene Limbo with labs in 12 months   The total time spent in the appt was 20 minutes and more than 50% was on counseling and direct patient cares.  All of the patient's questions were answered with apparent satisfaction. The patient knows to call the clinic with any problems, questions or concerns.   Sullivan Lone MD [place] AAHIVMS [facility] Hematology/Oncology Physician [facility]  (Office):       [phone] (Work cell):  [phone] (Fax):           [phone]  09/07/2020 10:50 AM   I, Reinaldo Raddle, am acting as scribe for Dr. Sullivan Lone, MD.       .I have reviewed the above documentation for accuracy and completeness, and I agree with the above. [name] MD

## 2020-09-07 ENCOUNTER — Other Ambulatory Visit: Payer: Self-pay

## 2020-09-07 ENCOUNTER — Inpatient Hospital Stay (HOSPITAL_BASED_OUTPATIENT_CLINIC_OR_DEPARTMENT_OTHER): Payer: Medicare Other | Admitting: Hematology

## 2020-09-07 ENCOUNTER — Other Ambulatory Visit (INDEPENDENT_AMBULATORY_CARE_PROVIDER_SITE_OTHER): Payer: Medicare Other | Admitting: Family Medicine

## 2020-09-07 ENCOUNTER — Inpatient Hospital Stay: Payer: Medicare Other | Attending: Hematology

## 2020-09-07 VITALS — BP 120/50 | HR 82 | Temp 97.8°F | Resp 18 | Ht 65.0 in | Wt 181.4 lb

## 2020-09-07 DIAGNOSIS — R300 Dysuria: Secondary | ICD-10-CM | POA: Diagnosis not present

## 2020-09-07 DIAGNOSIS — E538 Deficiency of other specified B group vitamins: Secondary | ICD-10-CM | POA: Diagnosis not present

## 2020-09-07 DIAGNOSIS — D649 Anemia, unspecified: Secondary | ICD-10-CM

## 2020-09-07 DIAGNOSIS — D509 Iron deficiency anemia, unspecified: Secondary | ICD-10-CM | POA: Diagnosis not present

## 2020-09-07 LAB — IRON AND TIBC
Iron: 74 ug/dL (ref 41–142)
Saturation Ratios: 26 % (ref 21–57)
TIBC: 290 ug/dL (ref 236–444)
UIBC: 216 ug/dL (ref 120–384)

## 2020-09-07 LAB — CMP (CANCER CENTER ONLY)
ALT: 54 U/L — ABNORMAL HIGH (ref 0–44)
AST: 23 U/L (ref 15–41)
Albumin: 3.8 g/dL (ref 3.5–5.0)
Alkaline Phosphatase: 77 U/L (ref 38–126)
Anion gap: 11 (ref 5–15)
BUN: 27 mg/dL — ABNORMAL HIGH (ref 8–23)
CO2: 21 mmol/L — ABNORMAL LOW (ref 22–32)
Calcium: 10.4 mg/dL — ABNORMAL HIGH (ref 8.9–10.3)
Chloride: 104 mmol/L (ref 98–111)
Creatinine: 0.83 mg/dL (ref 0.44–1.00)
GFR, Estimated: >60 mL/min (ref >60)
Glucose, Bld: 264 mg/dL — ABNORMAL HIGH (ref 70–99)
Potassium: 4.3 mmol/L (ref 3.5–5.1)
Sodium: 136 mmol/L (ref 135–145)
Total Bilirubin: 1.3 mg/dL — ABNORMAL HIGH (ref 0.3–1.2)
Total Protein: 7.2 g/dL (ref 6.5–8.1)

## 2020-09-07 LAB — POCT URINALYSIS DIPSTICK
Bilirubin, UA: NEGATIVE
Glucose, UA: NEGATIVE
Ketones, UA: NEGATIVE
Nitrite, UA: NEGATIVE
Protein, UA: POSITIVE — AB
Spec Grav, UA: 1.02 (ref 1.010–1.025)
Urobilinogen, UA: 0.2 E.U./dL
pH, UA: 6 (ref 5.0–8.0)

## 2020-09-07 LAB — CBC WITH DIFFERENTIAL/PLATELET
Abs Immature Granulocytes: 0.1 10*3/uL — ABNORMAL HIGH (ref 0.00–0.07)
Basophils Absolute: 0 10*3/uL (ref 0.0–0.1)
Basophils Relative: 0 %
Eosinophils Absolute: 0 10*3/uL (ref 0.0–0.5)
Eosinophils Relative: 0 %
HCT: 35.1 % — ABNORMAL LOW (ref 36.0–46.0)
Hemoglobin: 11.1 g/dL — ABNORMAL LOW (ref 12.0–15.0)
Immature Granulocytes: 1 %
Lymphocytes Relative: 3 %
Lymphs Abs: 0.6 10*3/uL — ABNORMAL LOW (ref 0.7–4.0)
MCH: 20.6 pg — ABNORMAL LOW (ref 26.0–34.0)
MCHC: 31.6 g/dL (ref 30.0–36.0)
MCV: 65 fL — ABNORMAL LOW (ref 80.0–100.0)
Monocytes Absolute: 1.3 10*3/uL — ABNORMAL HIGH (ref 0.1–1.0)
Monocytes Relative: 7 %
Neutro Abs: 17 10*3/uL — ABNORMAL HIGH (ref 1.7–7.7)
Neutrophils Relative %: 89 %
Platelets: 163 10*3/uL (ref 150–400)
RBC: 5.4 MIL/uL — ABNORMAL HIGH (ref 3.87–5.11)
RDW: 15.3 % (ref 11.5–15.5)
WBC: 19 10*3/uL — ABNORMAL HIGH (ref 4.0–10.5)
nRBC: 0.3 % — ABNORMAL HIGH (ref 0.0–0.2)

## 2020-09-07 LAB — FERRITIN: Ferritin: 351 ng/mL — ABNORMAL HIGH (ref 11–307)

## 2020-09-08 ENCOUNTER — Emergency Department (HOSPITAL_COMMUNITY): Payer: Medicare Other

## 2020-09-08 ENCOUNTER — Inpatient Hospital Stay (HOSPITAL_COMMUNITY)
Admission: EM | Admit: 2020-09-08 | Discharge: 2020-09-12 | DRG: 872 | Disposition: A | Discharge status: Home / Self Care | Payer: Medicare Other | Attending: Internal Medicine | Admitting: Internal Medicine

## 2020-09-08 ENCOUNTER — Ambulatory Visit: Payer: Medicare Other | Admitting: Adult Health

## 2020-09-08 ENCOUNTER — Ambulatory Visit: Payer: Medicare Other | Admitting: Family Medicine

## 2020-09-08 ENCOUNTER — Encounter (HOSPITAL_COMMUNITY): Payer: Self-pay | Admitting: Internal Medicine

## 2020-09-08 DIAGNOSIS — Z8249 Family history of ischemic heart disease and other diseases of the circulatory system: Secondary | ICD-10-CM | POA: Diagnosis not present

## 2020-09-08 DIAGNOSIS — Z952 Presence of prosthetic heart valve: Secondary | ICD-10-CM | POA: Diagnosis not present

## 2020-09-08 DIAGNOSIS — R7401 Elevation of levels of liver transaminase levels: Secondary | ICD-10-CM | POA: Diagnosis not present

## 2020-09-08 DIAGNOSIS — I69354 Hemiplegia and hemiparesis following cerebral infarction affecting left non-dominant side: Secondary | ICD-10-CM | POA: Diagnosis not present

## 2020-09-08 DIAGNOSIS — Z794 Long term (current) use of insulin: Secondary | ICD-10-CM

## 2020-09-08 DIAGNOSIS — Z7984 Long term (current) use of oral hypoglycemic drugs: Secondary | ICD-10-CM

## 2020-09-08 DIAGNOSIS — E038 Other specified hypothyroidism: Secondary | ICD-10-CM | POA: Diagnosis not present

## 2020-09-08 DIAGNOSIS — I6930 Unspecified sequelae of cerebral infarction: Secondary | ICD-10-CM

## 2020-09-08 DIAGNOSIS — E66811 Obesity, class 1: Secondary | ICD-10-CM | POA: Diagnosis present

## 2020-09-08 DIAGNOSIS — E860 Dehydration: Secondary | ICD-10-CM | POA: Diagnosis present

## 2020-09-08 DIAGNOSIS — N390 Urinary tract infection, site not specified: Secondary | ICD-10-CM | POA: Diagnosis not present

## 2020-09-08 DIAGNOSIS — Z79899 Other long term (current) drug therapy: Secondary | ICD-10-CM

## 2020-09-08 DIAGNOSIS — K7460 Unspecified cirrhosis of liver: Secondary | ICD-10-CM | POA: Diagnosis present

## 2020-09-08 DIAGNOSIS — Z6830 Body mass index (BMI) 30.0-30.9, adult: Secondary | ICD-10-CM | POA: Diagnosis not present

## 2020-09-08 DIAGNOSIS — N136 Pyonephrosis: Secondary | ICD-10-CM | POA: Diagnosis present

## 2020-09-08 DIAGNOSIS — N281 Cyst of kidney, acquired: Secondary | ICD-10-CM | POA: Diagnosis not present

## 2020-09-08 DIAGNOSIS — Z888 Allergy status to other drugs, medicaments and biological substances status: Secondary | ICD-10-CM

## 2020-09-08 DIAGNOSIS — D563 Thalassemia minor: Secondary | ICD-10-CM | POA: Diagnosis not present

## 2020-09-08 DIAGNOSIS — R300 Dysuria: Secondary | ICD-10-CM

## 2020-09-08 DIAGNOSIS — E669 Obesity, unspecified: Secondary | ICD-10-CM | POA: Diagnosis not present

## 2020-09-08 DIAGNOSIS — R69 Illness, unspecified: Secondary | ICD-10-CM | POA: Diagnosis not present

## 2020-09-08 DIAGNOSIS — Z9861 Coronary angioplasty status: Secondary | ICD-10-CM

## 2020-09-08 DIAGNOSIS — N1330 Unspecified hydronephrosis: Secondary | ICD-10-CM | POA: Diagnosis not present

## 2020-09-08 DIAGNOSIS — Z79890 Hormone replacement therapy (postmenopausal): Secondary | ICD-10-CM | POA: Diagnosis not present

## 2020-09-08 DIAGNOSIS — K219 Gastro-esophageal reflux disease without esophagitis: Secondary | ICD-10-CM | POA: Diagnosis present

## 2020-09-08 DIAGNOSIS — J9811 Atelectasis: Secondary | ICD-10-CM | POA: Diagnosis not present

## 2020-09-08 DIAGNOSIS — I480 Paroxysmal atrial fibrillation: Secondary | ICD-10-CM | POA: Diagnosis present

## 2020-09-08 DIAGNOSIS — Z20822 Contact with and (suspected) exposure to covid-19: Secondary | ICD-10-CM | POA: Diagnosis present

## 2020-09-08 DIAGNOSIS — E1165 Type 2 diabetes mellitus with hyperglycemia: Secondary | ICD-10-CM | POA: Diagnosis present

## 2020-09-08 DIAGNOSIS — K651 Peritoneal abscess: Secondary | ICD-10-CM | POA: Diagnosis not present

## 2020-09-08 DIAGNOSIS — A4151 Sepsis due to Escherichia coli [E. coli]: Secondary | ICD-10-CM | POA: Diagnosis present

## 2020-09-08 DIAGNOSIS — I10 Essential (primary) hypertension: Secondary | ICD-10-CM | POA: Diagnosis not present

## 2020-09-08 DIAGNOSIS — Z7901 Long term (current) use of anticoagulants: Secondary | ICD-10-CM

## 2020-09-08 DIAGNOSIS — G4733 Obstructive sleep apnea (adult) (pediatric): Secondary | ICD-10-CM | POA: Diagnosis present

## 2020-09-08 DIAGNOSIS — E785 Hyperlipidemia, unspecified: Secondary | ICD-10-CM | POA: Diagnosis present

## 2020-09-08 DIAGNOSIS — E0500 Thyrotoxicosis with diffuse goiter without thyrotoxic crisis or storm: Secondary | ICD-10-CM | POA: Diagnosis present

## 2020-09-08 DIAGNOSIS — I2510 Atherosclerotic heart disease of native coronary artery without angina pectoris: Secondary | ICD-10-CM | POA: Diagnosis present

## 2020-09-08 DIAGNOSIS — E039 Hypothyroidism, unspecified: Secondary | ICD-10-CM | POA: Diagnosis not present

## 2020-09-08 DIAGNOSIS — R5381 Other malaise: Secondary | ICD-10-CM | POA: Diagnosis not present

## 2020-09-08 DIAGNOSIS — I517 Cardiomegaly: Secondary | ICD-10-CM | POA: Diagnosis not present

## 2020-09-08 DIAGNOSIS — F419 Anxiety disorder, unspecified: Secondary | ICD-10-CM | POA: Diagnosis present

## 2020-09-08 DIAGNOSIS — E871 Hypo-osmolality and hyponatremia: Secondary | ICD-10-CM | POA: Diagnosis present

## 2020-09-08 DIAGNOSIS — Z872 Personal history of diseases of the skin and subcutaneous tissue: Secondary | ICD-10-CM | POA: Diagnosis not present

## 2020-09-08 DIAGNOSIS — K7581 Nonalcoholic steatohepatitis (NASH): Secondary | ICD-10-CM | POA: Diagnosis present

## 2020-09-08 DIAGNOSIS — A419 Sepsis, unspecified organism: Secondary | ICD-10-CM | POA: Diagnosis present

## 2020-09-08 DIAGNOSIS — Z8673 Personal history of transient ischemic attack (TIA), and cerebral infarction without residual deficits: Secondary | ICD-10-CM

## 2020-09-08 LAB — GLUCOSE, CAPILLARY
Glucose-Capillary: 261 mg/dL — ABNORMAL HIGH (ref 70–99)
Glucose-Capillary: 247 mg/dL — ABNORMAL HIGH (ref 70–99)

## 2020-09-08 LAB — URINALYSIS, ROUTINE W REFLEX MICROSCOPIC
Bilirubin Urine: NEGATIVE
Glucose, UA: NEGATIVE mg/dL
Ketones, ur: NEGATIVE mg/dL
Nitrite: NEGATIVE
Protein, ur: 30 mg/dL — AB
Specific Gravity, Urine: 1.013 (ref 1.005–1.030)
WBC, UA: >50 WBC/hpf — ABNORMAL HIGH (ref 0–5)
pH: 5 (ref 5.0–8.0)

## 2020-09-08 LAB — PROTIME-INR
INR: 1.8 — ABNORMAL HIGH (ref 0.8–1.2)
Prothrombin Time: 21.2 seconds — ABNORMAL HIGH (ref 11.4–15.2)

## 2020-09-08 LAB — LACTIC ACID, PLASMA
Lactic Acid, Venous: 4 mmol/L (ref 0.5–1.9)
Lactic Acid, Venous: 3 mmol/L (ref 0.5–1.9)
Lactic Acid, Venous: 2.7 mmol/L (ref 0.5–1.9)
Lactic Acid, Venous: 2.4 mmol/L (ref 0.5–1.9)

## 2020-09-08 LAB — CBC WITH DIFFERENTIAL/PLATELET
Abs Immature Granulocytes: 0.09 10*3/uL — ABNORMAL HIGH (ref 0.00–0.07)
Basophils Absolute: 0 10*3/uL (ref 0.0–0.1)
Basophils Relative: 0 %
Eosinophils Absolute: 0 10*3/uL (ref 0.0–0.5)
Eosinophils Relative: 0 %
HCT: 31.9 % — ABNORMAL LOW (ref 36.0–46.0)
Hemoglobin: 9.9 g/dL — ABNORMAL LOW (ref 12.0–15.0)
Immature Granulocytes: 1 %
Lymphocytes Relative: 5 %
Lymphs Abs: 0.6 10*3/uL — ABNORMAL LOW (ref 0.7–4.0)
MCH: 20.6 pg — ABNORMAL LOW (ref 26.0–34.0)
MCHC: 31 g/dL (ref 30.0–36.0)
MCV: 66.3 fL — ABNORMAL LOW (ref 80.0–100.0)
Monocytes Absolute: 0.2 10*3/uL (ref 0.1–1.0)
Monocytes Relative: 2 %
Neutro Abs: 11.9 10*3/uL — ABNORMAL HIGH (ref 1.7–7.7)
Neutrophils Relative %: 92 %
Platelets: 148 10*3/uL — ABNORMAL LOW (ref 150–400)
RBC: 4.81 MIL/uL (ref 3.87–5.11)
RDW: 15.1 % (ref 11.5–15.5)
WBC: 12.9 10*3/uL — ABNORMAL HIGH (ref 4.0–10.5)
nRBC: 0.5 % — ABNORMAL HIGH (ref 0.0–0.2)

## 2020-09-08 LAB — AMMONIA: Ammonia: 31 umol/L (ref 9–35)

## 2020-09-08 LAB — COMPREHENSIVE METABOLIC PANEL
ALT: 64 U/L — ABNORMAL HIGH (ref 0–44)
AST: 41 U/L (ref 15–41)
Albumin: 3.2 g/dL — ABNORMAL LOW (ref 3.5–5.0)
Alkaline Phosphatase: 75 U/L (ref 38–126)
Anion gap: 11 (ref 5–15)
BUN: 30 mg/dL — ABNORMAL HIGH (ref 8–23)
CO2: 18 mmol/L — ABNORMAL LOW (ref 22–32)
Calcium: 9.4 mg/dL (ref 8.9–10.3)
Chloride: 105 mmol/L (ref 98–111)
Creatinine, Ser: 0.96 mg/dL (ref 0.44–1.00)
GFR, Estimated: >60 mL/min (ref >60)
Glucose, Bld: 214 mg/dL — ABNORMAL HIGH (ref 70–99)
Potassium: 4.3 mmol/L (ref 3.5–5.1)
Sodium: 134 mmol/L — ABNORMAL LOW (ref 135–145)
Total Bilirubin: 1.3 mg/dL — ABNORMAL HIGH (ref 0.3–1.2)
Total Protein: 5.9 g/dL — ABNORMAL LOW (ref 6.5–8.1)

## 2020-09-08 LAB — HEMOGLOBIN A1C
Hgb A1c MFr Bld: 7.4 % — ABNORMAL HIGH (ref 4.8–5.6)
Mean Plasma Glucose: 165.68 mg/dL

## 2020-09-08 LAB — TYPE AND SCREEN
ABO/RH(D): O POS
Antibody Screen: NEGATIVE

## 2020-09-08 LAB — APTT: aPTT: 33 seconds (ref 24–36)

## 2020-09-08 LAB — CBG MONITORING, ED
Glucose-Capillary: 179 mg/dL — ABNORMAL HIGH (ref 70–99)
Glucose-Capillary: 231 mg/dL — ABNORMAL HIGH (ref 70–99)

## 2020-09-08 LAB — RESP PANEL BY RT-PCR (FLU A&B, COVID) ARPGX2
Influenza A by PCR: NEGATIVE
Influenza B by PCR: NEGATIVE
SARS Coronavirus 2 by RT PCR: NEGATIVE

## 2020-09-08 LAB — HEMOGLOBIN AND HEMATOCRIT, BLOOD
HCT: 27.3 % — ABNORMAL LOW (ref 36.0–46.0)
Hemoglobin: 8.7 g/dL — ABNORMAL LOW (ref 12.0–15.0)

## 2020-09-08 MED ORDER — ESCITALOPRAM OXALATE 10 MG PO TABS
20.0000 mg | ORAL_TABLET | Freq: Every day | ORAL | Status: DC
  Administered 2020-09-09 – 2020-09-12 (×4): 20 mg via ORAL
  Filled 2020-09-08 (×6): qty 2

## 2020-09-08 MED ORDER — SODIUM CHLORIDE 0.9 % IV SOLN
1.0000 g | INTRAVENOUS | Status: DC
  Administered 2020-09-08: 1 g via INTRAVENOUS
  Filled 2020-09-08: qty 10

## 2020-09-08 MED ORDER — INSULIN ASPART 100 UNIT/ML IJ SOLN
0.0000 [IU] | Freq: Three times a day (TID) | INTRAMUSCULAR | Status: DC
  Administered 2020-09-08: 5 [IU] via SUBCUTANEOUS
  Administered 2020-09-08: 8 [IU] via SUBCUTANEOUS
  Administered 2020-09-08: 5 [IU] via SUBCUTANEOUS
  Administered 2020-09-09 (×2): 3 [IU] via SUBCUTANEOUS
  Administered 2020-09-10: 5 [IU] via SUBCUTANEOUS
  Administered 2020-09-10: 3 [IU] via SUBCUTANEOUS
  Administered 2020-09-10: 2 [IU] via SUBCUTANEOUS
  Administered 2020-09-11 (×2): 5 [IU] via SUBCUTANEOUS
  Administered 2020-09-11: 2 [IU] via SUBCUTANEOUS

## 2020-09-08 MED ORDER — ACETAMINOPHEN 325 MG PO TABS
650.0000 mg | ORAL_TABLET | Freq: Four times a day (QID) | ORAL | Status: DC | PRN
  Administered 2020-09-09 – 2020-09-11 (×6): 650 mg via ORAL
  Filled 2020-09-08 (×6): qty 2

## 2020-09-08 MED ORDER — LACTATED RINGERS IV BOLUS (SEPSIS)
1000.0000 mL | Freq: Once | INTRAVENOUS | Status: AC
  Administered 2020-09-08: 1000 mL via INTRAVENOUS

## 2020-09-08 MED ORDER — LACTATED RINGERS IV SOLN: INTRAVENOUS | Status: AC

## 2020-09-08 MED ORDER — SODIUM CHLORIDE 0.9 % IV BOLUS
1000.0000 mL | Freq: Once | INTRAVENOUS | Status: AC
  Administered 2020-09-08: 1000 mL via INTRAVENOUS

## 2020-09-08 MED ORDER — LEVOTHYROXINE SODIUM 25 MCG PO TABS
137.0000 ug | ORAL_TABLET | Freq: Every day | ORAL | Status: DC
  Administered 2020-09-09 – 2020-09-12 (×4): 137 ug via ORAL
  Filled 2020-09-08 (×5): qty 1

## 2020-09-08 MED ORDER — LACTATED RINGERS IV BOLUS (SEPSIS)
500.0000 mL | Freq: Once | INTRAVENOUS | Status: AC
  Administered 2020-09-08: 500 mL via INTRAVENOUS

## 2020-09-08 MED ORDER — INSULIN NPH (HUMAN) (ISOPHANE) 100 UNIT/ML ~~LOC~~ SUSP
25.0000 [IU] | Freq: Every day | SUBCUTANEOUS | Status: DC
  Administered 2020-09-09 – 2020-09-12 (×4): 25 [IU] via SUBCUTANEOUS
  Filled 2020-09-08: qty 10

## 2020-09-08 MED ORDER — ACETAMINOPHEN 650 MG RE SUPP: 650.0000 mg | Freq: Four times a day (QID) | RECTAL | Status: DC | PRN

## 2020-09-08 MED ORDER — SODIUM CHLORIDE 0.9% FLUSH
3.0000 mL | Freq: Two times a day (BID) | INTRAVENOUS | Status: DC
  Administered 2020-09-09 – 2020-09-12 (×3): 3 mL via INTRAVENOUS

## 2020-09-08 NOTE — ED Notes (Signed)
Please call spouse when pt gets a room upstairs

## 2020-09-08 NOTE — Progress Notes (Signed)
PHARMACY - PHYSICIAN COMMUNICATION CRITICAL VALUE ALERT - BLOOD CULTURE IDENTIFICATION (BCID)  Breanna Webster is an 75 y.o. female who presented to [facility] on [facility]/08/2020 with a chief complaint of urosepsis  Assessment:  1/2 Blood cultures growing E. Coli  Name of physician (or Provider) Contacted: Dr. Tonie Griffith  Current antibiotics: Rocephin  Changes to prescribed antibiotics recommended:  Change Rocephin 2 g IV q24h  Results for orders placed or performed during the hospital encounter of 09/08/20  Blood Culture ID Panel (Reflexed) (Collected: 09/08/2020  4:10 AM)  Result Value Ref Range   Enterococcus faecalis NOT DETECTED NOT DETECTED   Enterococcus Faecium NOT DETECTED NOT DETECTED   Listeria monocytogenes NOT DETECTED NOT DETECTED   Staphylococcus species NOT DETECTED NOT DETECTED   Staphylococcus aureus (BCID) NOT DETECTED NOT DETECTED   Staphylococcus epidermidis NOT DETECTED NOT DETECTED   Staphylococcus lugdunensis NOT DETECTED NOT DETECTED   Streptococcus species NOT DETECTED NOT DETECTED   Streptococcus agalactiae NOT DETECTED NOT DETECTED   Streptococcus pneumoniae NOT DETECTED NOT DETECTED   Streptococcus pyogenes NOT DETECTED NOT DETECTED   A.calcoaceticus-baumannii NOT DETECTED NOT DETECTED   Bacteroides fragilis NOT DETECTED NOT DETECTED   Enterobacterales DETECTED (A) NOT DETECTED   Enterobacter cloacae complex NOT DETECTED NOT DETECTED   Escherichia coli DETECTED (A) NOT DETECTED   Klebsiella aerogenes NOT DETECTED NOT DETECTED   Klebsiella oxytoca NOT DETECTED NOT DETECTED   Klebsiella pneumoniae NOT DETECTED NOT DETECTED   Proteus species NOT DETECTED NOT DETECTED   Salmonella species NOT DETECTED NOT DETECTED   Serratia marcescens NOT DETECTED NOT DETECTED   Haemophilus influenzae NOT DETECTED NOT DETECTED   Neisseria meningitidis NOT DETECTED NOT DETECTED   Pseudomonas aeruginosa NOT DETECTED NOT DETECTED   Stenotrophomonas maltophilia NOT DETECTED  NOT DETECTED   Candida albicans NOT DETECTED NOT DETECTED   Candida auris NOT DETECTED NOT DETECTED   Candida glabrata NOT DETECTED NOT DETECTED   Candida krusei NOT DETECTED NOT DETECTED   Candida parapsilosis NOT DETECTED NOT DETECTED   Candida tropicalis NOT DETECTED NOT DETECTED   Cryptococcus neoformans/gattii NOT DETECTED NOT DETECTED   CTX-M ESBL NOT DETECTED NOT DETECTED   Carbapenem resistance IMP NOT DETECTED NOT DETECTED   Carbapenem resistance KPC NOT DETECTED NOT DETECTED   Carbapenem resistance NDM NOT DETECTED NOT DETECTED   Carbapenem resist OXA 48 LIKE NOT DETECTED NOT DETECTED   Carbapenem resistance VIM NOT DETECTED NOT DETECTED    Caryl Pina 09/09/2020  12:22 AM

## 2020-09-08 NOTE — ED Notes (Addendum)
Pt placed on bedside commode

## 2020-09-08 NOTE — H&P (Addendum)
History and Physical    Breanna Webster MEQ:[id number] DOB: [birth year] DOA: 09/08/2020  Referring MD/NP/PA: [name] Berkshire, MD PCP: [name], NP  Consultants: Larey Seat, MD-neurology  Sullivan Lone, MD oncology Patient coming from: Home via EMS  Chief Complaint: Discomfort with urinating  I have personally briefly reviewed patient's old medical records in [place]   HPI: Breanna Webster is a 75 y.o. female with medical history significant of hypertension, hyperlipidemia, CVA with residual left-sided weakness and tremor, PAF on Eliquis, severe AS s/p TAVR 06/2018, CAD, mitral valve endocarditis 09/2018, PAF, diabetes mellitus type 2, thalassemia minor, NASH cirrhosis, hypothyroidism, and GERD presents with complaints of discomfort with urination for a week and a half.  Patient complained of having pressure, urgency intermittently resulting in incontinence, and urinary frequency.  She noticed the symptoms and she was coming down with a urinary tract infection.  Patient tried multiple times to get an appointment with her primary care provider, but they were not able to get her in for an appointment.  Associated symptoms include fever almost up to 102 F, generalized weakness, shortness of breath, nonproductive cough, lower abdominal pain, and abdominal pain.  She tried taking Tylenol for fever symptoms. Patient had appointment with hematology for blood work yesterday where she is treated for iron deficiency anemia.  Records note lab revealed WBC 19, hemoglobin 11.1, BUN 27, creatinine 0.83, glucose 264, calcium 10.4, AST 54, and total bilirubin 1.3.  Following her appointment patient reported coming out of office and developing rigors.  She tried to go see her PCP at that time and drop off urine sample, but they were not available at that time.  Denies that she has had any nausea, vomiting, diarrhea, blood in stools, or issues with bleeding. In route with EMS patient was noted to  be febrile up to 100 F and tachypneic.  Patient was given acetaminophen 1000 mg and normal saline IV fluid bolus prior to arrival.  ED Course: Upon admission into the emergency department patient was seen to be afebrile, pulse 79-89, respirations 18-31, blood pressure 92/53-120/50, and O2 saturations currently maintained on room air.  Labs were significant for WBC 12.9, hemoglobin 9.9, platelets 148, sodium 134, BUN 30, creatinine 0.96, glucose 214, ALT 64, total bilirubin 1.3, and lactic acid 4->3.  Urinalysis was positive for moderate hemoglobin, large leukocytes, rare bacteria, greater than 50.  COVID-19 and influenza screening were negative.  Sepsis protocol has been initiated with full fluid bolus.  Urine cultures were sent and patient was started on empiric antibiotics of Rocephin.  Review of Systems  Constitutional: Positive for fever and malaise/fatigue.  HENT: Negative for ear discharge and nosebleeds.   Eyes: Negative for pain and discharge.  Respiratory: Positive for cough and shortness of breath.   Cardiovascular: Negative for chest pain and leg swelling.  Gastrointestinal: Positive for abdominal pain. Negative for nausea and vomiting.  Genitourinary: Positive for dysuria, frequency and urgency.  Musculoskeletal: Positive for back pain. Negative for falls.  Skin: Negative for rash.  Neurological: Positive for dizziness and loss of consciousness. Negative for weakness.  Psychiatric/Behavioral: Negative for memory loss and substance abuse.    Past Medical History:  Diagnosis Date  . Anxiety   . Arthritis    "back" (04/22/2018)  . Back pain   . Blood transfusion without reported diagnosis   . CAD (coronary artery disease)    a. 03/2018 s/p PCI/DES to the RCA (3.0x15 Onyx DES).  . Carotid artery stenosis  Mild  . Chest pain   . Chronic lower back pain   . Cirrhosis ([place])   . Colon polyps   . Diverticulitis   . Diverticulosis   . Esophageal thickening    seen on pre TAVR  CT scan, also questionable cirrhosis. MRI recommended. Will refer to GI  . Fatty liver   . GERD (gastroesophageal reflux disease)   . Grave's disease   . History of colonic polyps 05/22/2017  . History of hiatal hernia   . Hypertension   . Hypothyroidism   . IBS (irritable bowel syndrome)   . Osteopenia   . Pulmonary nodules    seen on pre TAVR CT. likley benign. no follow up recommended if pt low risk.  . S/P TAVR (transcatheter aortic valve replacement)   . Severe aortic stenosis   . Shortness of breath on exertion   . Stroke ([place])   . Thalassemia minor   . Thyroid disease   . Type II diabetes mellitus ([place])     Past Surgical History:  Procedure Laterality Date  . [place] STUDY N/A 03/03/2018   Procedure: [place] STUDY;  Surgeon: Mauri Pole, MD;  Location: [facility];  Service: Endoscopy;  Laterality: N/A;  . COLONOSCOPY    . COLONOSCOPY W/ BIOPSIES AND POLYPECTOMY    . CORONARY ANGIOGRAPHY Right 04/21/2018   Procedure: CORONARY ANGIOGRAPHY (CATH LAB);  Surgeon: [name], MD;  Location: [place] CV LAB;  Service: Cardiovascular;  Laterality: Right;  . CORONARY STENT INTERVENTION N/A 04/22/2018   Procedure: CORONARY STENT INTERVENTION;  Surgeon: [name], MD;  Location: [place] CV LAB;  Service: Cardiovascular;  Laterality: N/A;  . DILATION AND CURETTAGE OF UTERUS    . ESOPHAGEAL MANOMETRY N/A 03/03/2018   Procedure: ESOPHAGEAL MANOMETRY (EM);  Surgeon: Mauri Pole, MD;  Location: [facility];  Service: Endoscopy;  Laterality: N/A;  . GASTRIC FUNDOPLICATION    . HERNIA REPAIR    . HYSTEROSCOPY     fibroids  . LAPAROSCOPIC CHOLECYSTECTOMY    . LAPAROSCOPY     fibroids  . NISSEN FUNDOPLICATION  [id number]  . POLYPECTOMY    . RIGHT/LEFT HEART CATH AND CORONARY ANGIOGRAPHY N/A 02/20/2018   Procedure: RIGHT/LEFT HEART CATH AND CORONARY ANGIOGRAPHY;  Surgeon: [name], MD;  Location: [place] [facility] LAB;  Service: Cardiovascular;   Laterality: N/A;  . TEE WITHOUT CARDIOVERSION N/A 07/08/2018   Procedure: TRANSESOPHAGEAL ECHOCARDIOGRAM (TEE);  Surgeon: Burnell Blanks, MD;  Location: [place];  Service: Open Heart Surgery;  Laterality: N/A;  . TEE WITHOUT CARDIOVERSION  10/07/2018  . TEE WITHOUT CARDIOVERSION N/A 10/07/2018   Procedure: TRANSESOPHAGEAL ECHOCARDIOGRAM (TEE);  Surgeon: Jerline Pain, MD;  Location: [facility] ENDOSCOPY;  Service: Cardiovascular;  Laterality: N/A;  . TONSILLECTOMY    . TRANSCATHETER AORTIC VALVE REPLACEMENT, TRANSFEMORAL N/A 07/08/2018   Procedure: TRANSCATHETER AORTIC VALVE REPLACEMENT, TRANSFEMORAL;  Surgeon: Burnell Blanks, MD;  Location: [place];  Service: Open Heart Surgery;  Laterality: N/A;     reports that she has never smoked. She has never used smokeless tobacco. She reports that she does not drink alcohol and does not use drugs.  Allergies  Allergen Reactions  . Statins Other (See Comments)    Muscle aches    Family History  Adopted: Yes  Problem Relation Age of Onset  . Healthy Son        x 2  . Headache Other        Cluster  headaches  . Heart failure Mother   . Colon cancer Neg Hx   . Pancreatic cancer Neg Hx   . Rectal cancer Neg Hx   . Stomach cancer Neg Hx     Prior to Admission medications   Medication Sig Start Date End Date Taking? Authorizing Provider  amitriptyline (ELAVIL) 25 MG tablet TAKE 1 TABLET(25 MG) BY MOUTH AT BEDTIME 11/25/19   Nafziger, Tommi Rumps, NP  BD INSULIN SYRINGE U/F 31G X 5/16" 1 ML MISC FOUR TIMES A DAY UNDER THE SKIN 07/27/20   Nafziger, Tommi Rumps, NP  calcium-vitamin D (OSCAL WITH D) 500-200 MG-UNIT tablet Take 1 tablet by mouth.    [provider]  Cholecalciferol (VITAMIN D3 PO) Take 1 tablet by mouth daily.    [provider]  diltiazem (CARDIZEM CD) 120 MG 24 hr capsule TAKE ONE CAPSULE BY MOUTH DAILY 06/16/20   Nafziger, Tommi Rumps, NP  ELIQUIS 5 MG TABS tablet TAKE ONE TABLET BY MOUTH TWICE A DAY 07/01/20   Nafziger, Tommi Rumps, NP   Lancets (ONETOUCH DELICA PLUS [id number]) MISC USE TO CHECK BLOOD SUGAR TWICE DAILY.. 02/09/19   Renato Shin, MD  losartan (COZAAR) 50 MG tablet Take 1 tablet (50 mg total) by mouth daily. 04/21/20   Nafziger, Tommi Rumps, NP  metFORMIN (GLUCOPHAGE) 500 MG tablet Take 500 mg by mouth 2 (two) times daily with a meal.  12/01/18   [provider]  metFORMIN (GLUCOPHAGE-XR) 500 MG 24 hr tablet Take 500 mg by mouth 2 (two) times daily. 07/18/20   [provider]  Multiple Vitamin (MULITIVITAMIN WITH MINERALS) TABS Take 1 tablet by mouth daily.    [provider]  nitroGLYCERIN (NITROSTAT) 0.4 MG SL tablet Place 1 tablet (0.4 mg total) under the tongue every 5 (five) minutes as needed for chest pain. 08/30/20   [name], MD  NOVOLIN N 100 UNIT/ML injection Inject 35 Units into the skin daily before breakfast.    [provider]  NOVOLIN R 100 UNIT/ML injection 10 UNITS THIRTY MINUTES BEFORE MEALS.  5 ADDITIONAL UNITS WITH CARBS OR SNACKS.    [provider]  [facility] VERIO test strip USE TO MONITOR GLUCOSE LEVELS TWICE DAILY 12/29/18   Renato Shin, MD  SYNTHROID 125 MCG tablet Take 125 mcg by mouth every morning. Patient not taking: Reported on 09/07/2020 04/14/19   [provider]  SYNTHROID 137 MCG tablet Take 137 mcg by mouth daily. 09/05/20   [provider]    Physical Exam:  Constitutional: Elderly female who appears to be acutely ill Vitals:   09/08/20 0615 09/08/20 0624 09/08/20 0630 09/08/20 0645  BP: (!) 102/49  (!) 106/46 (!) 101/49  Pulse: 80  80 84  Resp: (!) 23  (!) 23 20  Temp:  98.7 F (37.1 C)    TempSrc:  Oral    SpO2: 93%  92% 92%  Weight:      Height:       Eyes: PERRL, lids and conjunctivae normal ENMT: Mucous membranes are dry. Posterior pharynx clear of any exudate or lesions.  Neck: normal, supple, no masses, no thyromegaly.  No JVD.   Respiratory: Normal respiratory effort clear to auscultation.  Patient  maintaining O2 saturation currently on room air. Cardiovascular: Regular rate and rhythm, no murmurs / rubs / gallops. No extremity edema. 2+ pedal pulses. No carotid bruits.  Abdomen: Protuberant abdomen with some mild tenderness to palpation epigastrically.  Bowel sounds are normal. Musculoskeletal: no clubbing / cyanosis. No joint deformity upper  and lower extremities. Good ROM, no contractures. Normal muscle tone.  Skin: no rashes, lesions, ulcers. No induration Neurologic: CN 2-12 grossly intact.  Patient with mild left-sided weakness and tremor present. Psychiatric: Normal judgment and insight. Alert and oriented x 3. Normal mood.     Labs on Admission: I have personally reviewed following labs and imaging studies  CBC: Recent Labs  Lab 09/07/20 0920 09/08/20 0416  WBC 19.0* 12.9*  NEUTROABS 17.0* 11.9*  HGB 11.1* 9.9*  HCT 35.1* 31.9*  MCV 65.0* 66.3*  PLT 163 811*   Basic Metabolic Panel: Recent Labs  Lab 09/07/20 0920 09/08/20 0416  NA 136 134*  K 4.3 4.3  CL 104 105  CO2 21* 18*  GLUCOSE 264* 214*  BUN 27* 30*  CREATININE 0.83 0.96  CALCIUM 10.4* 9.4   GFR: Estimated Creatinine Clearance: 52.7 mL/min (by C-G formula based on SCr of 0.96 mg/dL). Liver Function Tests: Recent Labs  Lab 09/07/20 0920 09/08/20 0416  AST 23 41  ALT 54* 64*  ALKPHOS 77 75  BILITOT 1.3* 1.3*  PROT 7.2 5.9*  ALBUMIN 3.8 3.2*   No results for input(s): LIPASE, AMYLASE in the last 168 hours. No results for input(s): AMMONIA in the last 168 hours. Coagulation Profile: Recent Labs  Lab 09/08/20 0416  INR 1.8*   Cardiac Enzymes: No results for input(s): CKTOTAL, CKMB, CKMBINDEX, TROPONINI in the last 168 hours. BNP (last 3 results) No results for input(s): PROBNP in the last 8760 hours. HbA1C: No results for input(s): HGBA1C in the last 72 hours. CBG: No results for input(s): GLUCAP in the last 168 hours. Lipid Profile: No results for input(s): CHOL, HDL, LDLCALC,  TRIG, CHOLHDL, LDLDIRECT in the last 72 hours. Thyroid Function Tests: No results for input(s): TSH, T4TOTAL, FREET4, T3FREE, THYROIDAB in the last 72 hours. Anemia Panel: Recent Labs    09/07/20 0920  FERRITIN 351*  TIBC 290  IRON 74   Urine analysis:    Component Value Date/Time   COLORURINE AMBER (A) 09/08/2020 0416   APPEARANCEUR CLOUDY (A) 09/08/2020 0416   LABSPEC 1.013 09/08/2020 0416   PHURINE 5.0 09/08/2020 0416   GLUCOSEU NEGATIVE 09/08/2020 0416   HGBUR MODERATE (A) 09/08/2020 0416   HGBUR negative 12/13/2008 1035   BILIRUBINUR NEGATIVE 09/08/2020 0416   BILIRUBINUR neg 09/07/2020 1422   KETONESUR NEGATIVE 09/08/2020 0416   PROTEINUR 30 (A) 09/08/2020 0416   UROBILINOGEN 0.2 09/07/2020 1422   UROBILINOGEN 0.2 09/18/2011 2049   NITRITE NEGATIVE 09/08/2020 0416   LEUKOCYTESUR LARGE (A) 09/08/2020 0416   Sepsis Labs: Recent Results (from the past 240 hour(s))  Resp Panel by RT-PCR (Flu A&B, Covid) Nasopharyngeal Swab     Status: None   Collection Time: 09/08/20  4:16 AM   Specimen: Nasopharyngeal Swab; Nasopharyngeal(NP) swabs in vial transport medium  Result Value Ref Range Status   SARS Coronavirus 2 by RT PCR NEGATIVE NEGATIVE Final    Comment: (NOTE) SARS-CoV-2 target nucleic acids are NOT DETECTED.  The SARS-CoV-2 RNA is generally detectable in upper respiratory specimens during the acute phase of infection. The lowest concentration of SARS-CoV-2 viral copies this assay can detect is 138 copies/mL. A negative result does not preclude SARS-Cov-2 infection and should not be used as the sole basis for treatment or other patient management decisions. A negative result may occur with  improper specimen collection/handling, submission of specimen other than nasopharyngeal swab, presence of viral mutation(s) within the areas targeted by this assay, and inadequate number of viral copies(<138  copies/mL). A negative result must be combined with clinical  observations, patient history, and epidemiological information. The expected result is Negative.  Fact Sheet for Patients:  EntrepreneurPulse.com.au  Fact Sheet for Healthcare Providers:  IncredibleEmployment.be  This test is no t yet approved or cleared by the [place] FDA and  has been authorized for detection and/or diagnosis of SARS-CoV-2 by FDA under an Emergency Use Authorization (EUA). This EUA will remain  in effect (meaning this test can be used) for the duration of the COVID-19 declaration under Section 564(b)(1) of the Act, 21 U.S.C.section 360bbb-3(b)(1), unless the authorization is terminated  or revoked sooner.       Influenza A by PCR NEGATIVE NEGATIVE Final   Influenza B by PCR NEGATIVE NEGATIVE Final    Comment: (NOTE) The Xpert Xpress SARS-CoV-2/FLU/RSV plus assay is intended as an aid in the diagnosis of influenza from Nasopharyngeal swab specimens and should not be used as a sole basis for treatment. Nasal washings and aspirates are unacceptable for Xpert Xpress SARS-CoV-2/FLU/RSV testing.  Fact Sheet for Patients: EntrepreneurPulse.com.au  Fact Sheet for Healthcare Providers: IncredibleEmployment.be  This test is not yet approved or cleared by the [place] FDA and has been authorized for detection and/or diagnosis of SARS-CoV-2 by FDA under an Emergency Use Authorization (EUA). This EUA will remain in effect (meaning this test can be used) for the duration of the COVID-19 declaration under Section 564(b)(1) of the Act, 21 U.S.C. section 360bbb-3(b)(1), unless the authorization is terminated or revoked.  Performed at [facility], [place] [address], [place], [place] [address]      Radiological Exams on Admission: DG Chest Port 1 View  Result Date: 09/08/2020 CLINICAL DATA:  Sepsis EXAM: PORTABLE CHEST 1 VIEW COMPARISON:  11/27/2019 FINDINGS: Minimal left basilar  atelectasis. Lungs are otherwise clear. No pneumothorax or pleural effusion. Mild cardiomegaly is stable. Transcatheter aortic valve replacement has been performed. Pulmonary vascularity is normal. No acute bone abnormality. IMPRESSION: No active disease.  Stable cardiomegaly. Electronically Signed   By: Fidela Salisbury MD   On: 09/08/2020 04:55    EKG: Independently reviewed.  Sinus rhythm 87 bpm with QTc 500  Assessment/Plan Sepsis secondary to UTI: Patient presented was noted to be tachypneic with white blood cell count 12.9 and initial lactic acid elevated up to 4.  Urinalysis was positive for moderate hemoglobin, large leukocytes, rare bacteria, greater than 50 WBCs.  Review of prior urine cultures E. coli and Klebsiella pneumonia that were sensitive to Rocephin. -Admit to a progressive -Follow-up blood and urine cultures -Continue antibiotics of Rocephin -Acetaminophen as needed for fever  Transient hypotension: On admission blood pressures as low as 79/33 patient presents after she initially received 2 L fluid boluses.  Home blood pressure medications include losartan 50 mg daily and Cardizem 120 mg daily. -Maintain goal MAP > 65 -Bolus additional 1 L normal saline IV fluids and then continue on a rate of 150 mL/h -Hold home blood pressure medications   Thalassemia minor: Acute on chronic. Hemoglobin 9.9 g/dL, but previously had been 11.1 g/dL yesterday.  Patient denies any black or dark stools.  On the differential also includes possibility of upper GI bleed given elevated BUN.  Iron leve 74 with ferritin 351 on 5/18. -Type and screen for possible need of blood product -Check stool guaiac -Recheck H&H  Hyponatremia dehydration: Acute.  On admission sodium 134.  Suspect patient is likely dehydrated given elevated BUN to creatinine ratio. -IV fluids as noted above -Recheck sodium  levels  Metabolic acidosis: On admission CO2 18.  Suspect likely related with initial lactic acidosis  appreciated. -Continue to monitor  Paroxysmal atrial fibrillation on chronic anticoagulation: Appears to be in sinus rhythm at this time.  Last dose of Eliquis was yesterday. -Holding Cardizem due to hypotension -Continue Eliquis if hemoglobin stay stable   Diabetes mellitus type 2: Patient presents with glucose elevated up to 214.  Last hemoglobin A1c 6.5 on 05/05/2020.  Home medication regimen includes metformin 500 mg twice daily, Novolin N-100  35 units daily before breakfast, Novolin-R10 units before meals -Hypoglycemic protocols -Add-on hemoglobin A1c -Held metformin  -Reduce Novolin N to 20 units while in the hospital -CBGs before every meal with moderate SSI  History of CVA with residual deficit: Patient with prior history of stroke last year with residual left-sided weakness and tremor -Restart Eliquis when able  Elevated ALT and hyperbilirubinemia [name] cirrhosis: AST was elevated at 264 and total bilirubin mildly elevated at 1.3.  Patient with a history of [place] cirrhosis. -Continue to monitor  CAD AMS s/p TAVR :s/p PCI/DES x1 to [facility] in12/2019.  Patient with severe aortic stenosis status post TAVR back in 2020.  Hypothyroidism: TSH 1.47 on  05/05/2020. -Continue levothyroxine  OSA -Continue CPAP   Obesity 30.12 kg/m -Continue to counsel on the need of healthy diet and lifestyle modification  DVT prophylaxis: SCDs, will restart Eliquis if hemoglobin appears to be stable Code Status: Full Family Communication: Husband updated over the phone Disposition Plan: Likely discharge home once medically stable Consults called: None Admission status: Inpatient, requiring need of close blood pressure monitoring as well as IV antibiotics  Norval Morton MD [facility]   If 7PM-7AM, please contact night-coverage   09/08/2020, 7:18 AM

## 2020-09-08 NOTE — ED Triage Notes (Signed)
Pt BIB EMS from home. Pt has hx of recurrent UTIs. Last week pt felt one coming on and made apt with doctor but they could not see her. EMS reports pt is febrile (100) and tachpnic - end tidal 23.  Pt BP 130/50 20 LW  EMS gave 1000 tylenol and normal saline bolus

## 2020-09-08 NOTE — Sepsis Progress Note (Signed)
Following for sepsis monitoring ?

## 2020-09-08 NOTE — ED Provider Notes (Signed)
[facility] EMERGENCY DEPARTMENT Provider Note   CSN: [id number] Arrival date & time: 09/08/20  3491     History Chief Complaint  Patient presents with  . Sepsis   . Urinary Tract Infection    Breanna Webster is a 75 y.o. female.  Patient presents to the emergency department with concerns over UTI and possible sepsis.  Patient reports that she has been having urinary tract infection symptoms for more than a week.  She has been calling her primary doctor but has not been able to be seen.  Patient reports urinary frequency now with incontinence.  She has had UTIs in the past with similar symptoms.  Patient developed a fever tonight and feels very weak.  She does report a slight cough, nonproductive.  Patient now with back pain and lower abdominal pain.        Past Medical History:  Diagnosis Date  . Anxiety   . Arthritis    "back" (04/22/2018)  . Back pain   . Blood transfusion without reported diagnosis   . CAD (coronary artery disease)    a. 03/2018 s/p PCI/DES to the RCA (3.0x15 Onyx DES).  . Carotid artery stenosis    Mild  . Chest pain   . Chronic lower back pain   . Cirrhosis ([place])   . Colon polyps   . Diverticulitis   . Diverticulosis   . Esophageal thickening    seen on pre TAVR CT scan, also questionable cirrhosis. MRI recommended. Will refer to GI  . Fatty liver   . GERD (gastroesophageal reflux disease)   . Grave's disease   . History of colonic polyps 05/22/2017  . History of hiatal hernia   . Hypertension   . Hypothyroidism   . IBS (irritable bowel syndrome)   . Osteopenia   . Pulmonary nodules    seen on pre TAVR CT. likley benign. no follow up recommended if pt low risk.  . S/P TAVR (transcatheter aortic valve replacement)   . Severe aortic stenosis   . Shortness of breath on exertion   . Stroke ([place])   . Thalassemia minor   . Thyroid disease   . Type II diabetes mellitus [facility])     Patient Active Problem List   Diagnosis  Date Noted  . Fatigue associated with anemia 08/03/2020  . Cerebrovascular accident (CVA) due to embolism of right posterior cerebral artery ([place]) 08/03/2020  . OSA treated with BiPAP 08/03/2020  . Complex sleep apnea syndrome 08/03/2020  . Treatment-emergent central sleep apnea 08/03/2020  . Chronic intermittent hypoxia with obstructive sleep apnea 04/21/2020  . OSA (obstructive sleep apnea) 04/21/2020  . History of cardioembolic stroke 79/15/0569  . Gait disturbance, post-stroke 03/29/2020  . 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 ([place]) 03/29/2020  . Anxiety   . RLQ abdominal pain 10/22/2019  . Paroxysmal atrial fibrillation ([place]) 05/22/2019  . Iron deficiency anemia 05/07/2019  . Atrial fibrillation with RVR ([place]) 10/21/2018  . Cerebellar stroke, acute ([place]) 10/21/2018  . Streptococcal endocarditis   . Endocarditis of mitral valve 10/07/2018  . Bacteremia due to Streptococcus Salivarius 10/07/2018  . Sepsis ([place]) 10/04/2018  . Acute metabolic encephalopathy 79/48/0165  . Severe aortic stenosis 07/08/2018  . S/P TAVR (transcatheter aortic valve replacement) 07/08/2018  . Esophageal thickening   . CAD in native artery 04/22/2018  . Gastroesophageal reflux disease   . Pulmonary hypertension ([place]) 02/21/2018  . Essential hypertension 07/15/2017  . History of colonic polyps 05/22/2017  . Elevated  liver function tests 12/05/2016  . DOE (dyspnea on exertion) 07/19/2016  . Thalassemia minor 05/29/2016  . Left bundle branch block 12/06/2015  . Upper airway cough syndrome 10/14/2015  . Myalgia 02/17/2014  . Carotid artery stenosis 06/05/2013  . Eustachian tube dysfunction 05/07/2013  . Neuropathy of leg 03/07/2012  . Anemia, unspecified 01/31/2012  . Hot flashes 08/24/2010  . Diabetes mellitus due to underlying condition, uncontrolled ([place]) 06/30/2010  . Generalized abdominal pain 03/24/2010  . Obesity 12/21/2009  . IBS (irritable bowel syndrome) 04/29/2009  .  Vitamin D deficiency 03/10/2009  . Hypothyroidism 12/13/2008  . Dyslipidemia 12/13/2008  . Anxiety state 12/13/2008  . Other specified disorders of bladder 12/13/2008    Past Surgical History:  Procedure Laterality Date  . [place] STUDY N/A 03/03/2018   Procedure: [place] STUDY;  Surgeon: Mauri Pole, MD;  Location: [facility];  Service: Endoscopy;  Laterality: N/A;  . COLONOSCOPY    . COLONOSCOPY W/ BIOPSIES AND POLYPECTOMY    . CORONARY ANGIOGRAPHY Right 04/21/2018   Procedure: CORONARY ANGIOGRAPHY (CATH LAB);  Surgeon: [name], MD;  Location: [facility] CV LAB;  Service: Cardiovascular;  Laterality: Right;  . CORONARY STENT INTERVENTION N/A 04/22/2018   Procedure: CORONARY STENT INTERVENTION;  Surgeon: [name], MD;  Location: [facility] CV LAB;  Service: Cardiovascular;  Laterality: N/A;  . DILATION AND CURETTAGE OF UTERUS    . ESOPHAGEAL MANOMETRY N/A 03/03/2018   Procedure: ESOPHAGEAL MANOMETRY (EM);  Surgeon: Mauri Pole, MD;  Location: [facility];  Service: Endoscopy;  Laterality: N/A;  . GASTRIC FUNDOPLICATION    . HERNIA REPAIR    . HYSTEROSCOPY     fibroids  . LAPAROSCOPIC CHOLECYSTECTOMY    . LAPAROSCOPY     fibroids  . NISSEN FUNDOPLICATION  0998P  . POLYPECTOMY    . RIGHT/LEFT HEART CATH AND CORONARY ANGIOGRAPHY N/A 02/20/2018   Procedure: RIGHT/LEFT HEART CATH AND CORONARY ANGIOGRAPHY;  Surgeon: [name], MD;  Location: [place] [facility] CV LAB;  Service: Cardiovascular;  Laterality: N/A;  . TEE WITHOUT CARDIOVERSION N/A 07/08/2018   Procedure: TRANSESOPHAGEAL ECHOCARDIOGRAM (TEE);  Surgeon: Burnell Blanks, MD;  Location: [facility];  Service: Open Heart Surgery;  Laterality: N/A;  . TEE WITHOUT CARDIOVERSION  10/07/2018  . TEE WITHOUT CARDIOVERSION N/A 10/07/2018   Procedure: TRANSESOPHAGEAL ECHOCARDIOGRAM (TEE);  Surgeon: Jerline Pain, MD;  [facility]COPY;  Service: Cardiovascular;  Laterality: N/A;  .  TONSILLECTOMY    . TRANSCATHETER AORTIC VALVE REPLACEMENT, TRANSFEMORAL N/A 07/08/2018   Procedure: TRANSCATHETER AORTIC VALVE REPLACEMENT, TRANSFEMORAL;  Surgeon: Burnell Blanks, MD;  Location: [facility];  Service: Open Heart Surgery;  Laterality: N/A;     OB History    Gravida  2   Para  2   Term  2   Preterm      AB      Living        SAB      IAB      Ectopic      Multiple      Live Births              Family History  Adopted: Yes  Problem Relation Age of Onset  . Healthy Son        x 2  . Headache Other        Cluster headaches  . Heart failure Mother   . Colon cancer Neg Hx   . Pancreatic cancer Neg  Hx   . Rectal cancer Neg Hx   . Stomach cancer Neg Hx     Social History   Tobacco Use  . Smoking status: Never Smoker  . Smokeless tobacco: Never Used  Vaping Use  . Vaping Use: Never used  Substance Use Topics  . Alcohol use: No    Alcohol/week: 0.0 standard drinks  . Drug use: Never    Home Medications Prior to Admission medications   Medication Sig Start Date End Date Taking? Authorizing Provider  amitriptyline (ELAVIL) 25 MG tablet TAKE 1 TABLET(25 MG) BY MOUTH AT BEDTIME 11/25/19   Nafziger, Tommi Rumps, NP  BD INSULIN SYRINGE U/F 31G X 5/16" 1 ML MISC FOUR TIMES A DAY UNDER THE SKIN 07/27/20   Nafziger, Tommi Rumps, NP  calcium-vitamin D (OSCAL WITH D) 500-200 MG-UNIT tablet Take 1 tablet by mouth.    [provider]  Cholecalciferol (VITAMIN D3 PO) Take 1 tablet by mouth daily.    [provider]  diltiazem (CARDIZEM CD) 120 MG 24 hr capsule TAKE ONE CAPSULE BY MOUTH DAILY 06/16/20   Nafziger, Tommi Rumps, NP  ELIQUIS 5 MG TABS tablet TAKE ONE TABLET BY MOUTH TWICE A DAY 07/01/20   Nafziger, Tommi Rumps, NP  Lancets (ONETOUCH DELICA PLUS [id number]) MISC USE TO CHECK BLOOD SUGAR TWICE DAILY.. 02/09/19   Renato Shin, MD  losartan (COZAAR) 50 MG tablet Take 1 tablet (50 mg total) by mouth daily. 04/21/20   Nafziger, Tommi Rumps, NP  metFORMIN (GLUCOPHAGE)  500 MG tablet Take 500 mg by mouth 2 (two) times daily with a meal.  12/01/18   [provider]  metFORMIN (GLUCOPHAGE-XR) 500 MG 24 hr tablet Take 500 mg by mouth 2 (two) times daily. 07/18/20   [provider]  Multiple Vitamin (MULITIVITAMIN WITH MINERALS) TABS Take 1 tablet by mouth daily.    [provider]  nitroGLYCERIN (NITROSTAT) 0.4 MG SL tablet Place 1 tablet (0.4 mg total) under the tongue every 5 (five) minutes as needed for chest pain. 08/30/20   [name], MD  NOVOLIN N 100 UNIT/ML injection Inject 35 Units into the skin daily before breakfast.    [provider]  NOVOLIN R 100 UNIT/ML injection 10 UNITS THIRTY MINUTES BEFORE MEALS.  5 ADDITIONAL UNITS WITH CARBS OR SNACKS.    [provider]  [facility] VERIO test strip USE TO MONITOR GLUCOSE LEVELS TWICE DAILY 12/29/18   Renato Shin, MD  SYNTHROID 125 MCG tablet Take 125 mcg by mouth every morning. Patient not taking: Reported on 09/07/2020 04/14/19   [provider]  SYNTHROID 137 MCG tablet Take 137 mcg by mouth daily. 09/05/20   [provider]    Allergies    Statins  Review of Systems   Review of Systems  Constitutional: Positive for fatigue and fever.  Respiratory: Positive for cough.   Gastrointestinal: Positive for abdominal pain.  Genitourinary: Positive for frequency.  Musculoskeletal: Positive for back pain.  All other systems reviewed and are negative.   Physical Exam Updated Vital Signs BP (!) 101/49   Pulse 84   Temp 98.7 F (37.1 C) (Oral)   Resp 20   Ht 5' 5"  (1.651 m)   Wt 82.1 kg   SpO2 92%   BMI 30.12 kg/m   Physical Exam Vitals and nursing note reviewed.  Constitutional:      General: She is not in acute distress.    Appearance: Normal appearance. She is well-developed.  HENT:     Head: Normocephalic and atraumatic.  Right Ear: Hearing normal.     Left Ear: Hearing normal.     Nose: Nose normal.  Eyes:      Conjunctiva/sclera: Conjunctivae normal.     Pupils: Pupils are equal, round, and reactive to light.  Cardiovascular:     Rate and Rhythm: Regular rhythm.     Heart sounds: S1 normal and S2 normal. No murmur heard. No friction rub. No gallop.   Pulmonary:     Effort: Pulmonary effort is normal. Tachypnea present. No respiratory distress.     Breath sounds: Normal breath sounds.  Chest:     Chest wall: No tenderness.  Abdominal:     General: Bowel sounds are normal.     Palpations: Abdomen is soft.     Tenderness: There is generalized abdominal tenderness. There is no guarding or rebound. Negative signs include Murphy's sign and McBurney's sign.     Hernia: No hernia is present.  Musculoskeletal:        General: Normal range of motion.     Cervical back: Normal range of motion and neck supple.  Skin:    General: Skin is warm and dry.     Findings: No rash.  Neurological:     Mental Status: She is alert and oriented to person, place, and time.     GCS: GCS eye subscore is 4. GCS verbal subscore is 5. GCS motor subscore is 6.     Cranial Nerves: No cranial nerve deficit.     Sensory: No sensory deficit.     Coordination: Coordination normal.  Psychiatric:        Speech: Speech normal.        Behavior: Behavior normal.        Thought Content: Thought content normal.     ED Results / Procedures / Treatments   Labs (all labs ordered are listed, but only abnormal results are displayed) Labs Reviewed  LACTIC ACID, PLASMA - Abnormal; Notable for the following components:      Result Value   Lactic Acid, Venous 4.0 (*)    All other components within normal limits  LACTIC ACID, PLASMA - Abnormal; Notable for the following components:   Lactic Acid, Venous 3.0 (*)    All other components within normal limits  COMPREHENSIVE METABOLIC PANEL - Abnormal; Notable for the following components:   Sodium 134 (*)    CO2 18 (*)    Glucose, Bld 214 (*)    BUN 30 (*)    Total Protein 5.9  (*)    Albumin 3.2 (*)    ALT 64 (*)    Total Bilirubin 1.3 (*)    All other components within normal limits  CBC WITH DIFFERENTIAL/PLATELET - Abnormal; Notable for the following components:   WBC 12.9 (*)    Hemoglobin 9.9 (*)    HCT 31.9 (*)    MCV 66.3 (*)    MCH 20.6 (*)    Platelets 148 (*)    nRBC 0.5 (*)    Neutro Abs 11.9 (*)    Lymphs Abs 0.6 (*)    Abs Immature Granulocytes 0.09 (*)    All other components within normal limits  PROTIME-INR - Abnormal; Notable for the following components:   Prothrombin Time 21.2 (*)    INR 1.8 (*)    All other components within normal limits  URINALYSIS, ROUTINE W REFLEX MICROSCOPIC - Abnormal; Notable for the following components:   Color, Urine AMBER (*)    APPearance CLOUDY (*)  Hgb urine dipstick MODERATE (*)    Protein, ur 30 (*)    Leukocytes,Ua LARGE (*)    WBC, UA >50 (*)    Bacteria, UA RARE (*)    Non Squamous Epithelial 0-5 (*)    All other components within normal limits  RESP PANEL BY RT-PCR (FLU A&B, COVID) ARPGX2  CULTURE, BLOOD (SINGLE)  URINE CULTURE  CULTURE, BLOOD (SINGLE)  APTT    EKG EKG Interpretation  Date/Time:  Thursday Sep 08 2020 04:20:32 EDT Ventricular Rate:  87 PR Interval:  173 QRS Duration: 151 QT Interval:  415 QTC Calculation: 500 R Axis:   -59 Text Interpretation: Sinus rhythm Probable left atrial enlargement Left bundle branch block No significant change since last tracing Confirmed by Orpah Greek ([id number]) on 09/08/2020 4:23:14 AM   Radiology DG Chest Port 1 View  Result Date: 09/08/2020 CLINICAL DATA:  Sepsis EXAM: PORTABLE CHEST 1 VIEW COMPARISON:  11/27/2019 FINDINGS: Minimal left basilar atelectasis. Lungs are otherwise clear. No pneumothorax or pleural effusion. Mild cardiomegaly is stable. Transcatheter aortic valve replacement has been performed. Pulmonary vascularity is normal. No acute bone abnormality. IMPRESSION: No active disease.  Stable cardiomegaly.  Electronically Signed   By: Fidela Salisbury MD   On: 09/08/2020 04:55    Procedures Procedures   Medications Ordered in ED Medications  lactated ringers infusion ( Intravenous New Bag/Given 09/08/20 0645)  cefTRIAXone (ROCEPHIN) 1 g in sodium chloride 0.9 % 100 mL IVPB (0 g Intravenous Stopped 09/08/20 0624)  lactated ringers bolus 1,000 mL (0 mLs Intravenous Stopped 09/08/20 0620)    And  lactated ringers bolus 1,000 mL (0 mLs Intravenous Stopped 09/08/20 0620)    And  lactated ringers bolus 500 mL (500 mLs Intravenous New Bag/Given 09/08/20 0645)    ED Course  I have reviewed the triage vital signs and the nursing notes.  Pertinent labs & imaging results that were available during my care of the patient were reviewed by me and considered in my medical decision making (see chart for details).    MDM Rules/Calculators/A&P                          Patient presents to the emergency department for evaluation of urinary symptoms.  Patient reports that symptoms have been ongoing for a week or so.  She has a history of recurrent UTIs in the past.  She was trying to get into see her doctor but could not get an appointment.  She did have a low-grade fever at home earlier and is starting to feel more sick.  At arrival she was tachypneic and has been intermittently mildly hypotensive.  Presentation concerning for sepsis.  With her symptoms, covered empirically for urine.  Ultimately urinalysis was obtained and is consistent with infection.  She also has an elevated lactic acid at 4.  Repeat is down to 3 with fluids.  Will require hospitalization for further management of urosepsis.  CRITICAL CARE Performed by: Orpah Greek   Total critical care time: 30 minutes  Critical care time was exclusive of separately billable procedures and treating other patients.  Critical care was necessary to treat or prevent imminent or life-threatening deterioration.  Critical care was time spent  personally by me on the following activities: development of treatment plan with patient and/or surrogate as well as nursing, discussions with consultants, evaluation of patient's response to treatment, examination of patient, obtaining history from patient or surrogate, ordering and performing treatments  and interventions, ordering and review of laboratory studies, ordering and review of radiographic studies, pulse oximetry and re-evaluation of patient's condition.   Final Clinical Impression(s) / ED Diagnoses Final diagnoses:  Sepsis, due to unspecified organism, unspecified whether acute organ dysfunction present [facility])  Urinary tract infection without hematuria, site unspecified    Rx / DC Orders ED Discharge Orders    None       Orpah Greek, MD 09/08/20 [phone]

## 2020-09-09 ENCOUNTER — Other Ambulatory Visit: Payer: Self-pay | Admitting: *Deleted

## 2020-09-09 DIAGNOSIS — A419 Sepsis, unspecified organism: Secondary | ICD-10-CM | POA: Diagnosis not present

## 2020-09-09 DIAGNOSIS — I6930 Unspecified sequelae of cerebral infarction: Secondary | ICD-10-CM

## 2020-09-09 DIAGNOSIS — N390 Urinary tract infection, site not specified: Secondary | ICD-10-CM

## 2020-09-09 DIAGNOSIS — I480 Paroxysmal atrial fibrillation: Secondary | ICD-10-CM

## 2020-09-09 DIAGNOSIS — E038 Other specified hypothyroidism: Secondary | ICD-10-CM

## 2020-09-09 LAB — CBC
HCT: 28.8 % — ABNORMAL LOW (ref 36.0–46.0)
Hemoglobin: 9.1 g/dL — ABNORMAL LOW (ref 12.0–15.0)
MCH: 20.5 pg — ABNORMAL LOW (ref 26.0–34.0)
MCHC: 31.6 g/dL (ref 30.0–36.0)
MCV: 64.9 fL — ABNORMAL LOW (ref 80.0–100.0)
Platelets: UNDETERMINED 10*3/uL (ref 150–400)
RBC: 4.44 MIL/uL (ref 3.87–5.11)
RDW: 14.9 % (ref 11.5–15.5)
WBC: 12.2 10*3/uL — ABNORMAL HIGH (ref 4.0–10.5)
nRBC: 0 % (ref 0.0–0.2)

## 2020-09-09 LAB — COMPREHENSIVE METABOLIC PANEL
ALT: 48 U/L — ABNORMAL HIGH (ref 0–44)
AST: 28 U/L (ref 15–41)
Albumin: 2.7 g/dL — ABNORMAL LOW (ref 3.5–5.0)
Alkaline Phosphatase: 57 U/L (ref 38–126)
Anion gap: 9 (ref 5–15)
BUN: 22 mg/dL (ref 8–23)
CO2: 19 mmol/L — ABNORMAL LOW (ref 22–32)
Calcium: 9.1 mg/dL (ref 8.9–10.3)
Chloride: 106 mmol/L (ref 98–111)
Creatinine, Ser: 0.73 mg/dL (ref 0.44–1.00)
GFR, Estimated: >60 mL/min (ref >60)
Glucose, Bld: 145 mg/dL — ABNORMAL HIGH (ref 70–99)
Potassium: 3.9 mmol/L (ref 3.5–5.1)
Sodium: 134 mmol/L — ABNORMAL LOW (ref 135–145)
Total Bilirubin: 0.8 mg/dL (ref 0.3–1.2)
Total Protein: 5.3 g/dL — ABNORMAL LOW (ref 6.5–8.1)

## 2020-09-09 LAB — URINE CULTURE
MICRO NUMBER:: 11905819
SPECIMEN QUALITY:: ADEQUATE

## 2020-09-09 LAB — GLUCOSE, CAPILLARY
Glucose-Capillary: 155 mg/dL — ABNORMAL HIGH (ref 70–99)
Glucose-Capillary: 153 mg/dL — ABNORMAL HIGH (ref 70–99)
Glucose-Capillary: 173 mg/dL — ABNORMAL HIGH (ref 70–99)
Glucose-Capillary: 133 mg/dL — ABNORMAL HIGH (ref 70–99)
Glucose-Capillary: 218 mg/dL — ABNORMAL HIGH (ref 70–99)
Glucose-Capillary: 177 mg/dL — ABNORMAL HIGH (ref 70–99)

## 2020-09-09 LAB — OCCULT BLOOD X 1 CARD TO LAB, STOOL: Fecal Occult Bld: NEGATIVE

## 2020-09-09 LAB — LACTIC ACID, PLASMA: Lactic Acid, Venous: 1.3 mmol/L (ref 0.5–1.9)

## 2020-09-09 MED ORDER — SODIUM CHLORIDE 0.9 % IV SOLN
2.0000 g | Freq: Every day | INTRAVENOUS | Status: DC
  Administered 2020-09-09 (×2): 2 g via INTRAVENOUS
  Filled 2020-09-09 (×3): qty 20

## 2020-09-09 MED ORDER — SODIUM CHLORIDE 0.9 % IV SOLN
INTRAVENOUS | Status: DC | PRN
  Administered 2020-09-09 – 2020-09-10 (×2): 250 mL via INTRAVENOUS

## 2020-09-09 MED ORDER — APIXABAN 5 MG PO TABS
5.0000 mg | ORAL_TABLET | Freq: Two times a day (BID) | ORAL | Status: DC
  Administered 2020-09-09 – 2020-09-12 (×7): 5 mg via ORAL
  Filled 2020-09-09 (×7): qty 1

## 2020-09-09 MED ORDER — ONDANSETRON HCL 4 MG/2ML IJ SOLN: 4.0000 mg | Freq: Four times a day (QID) | INTRAMUSCULAR | Status: DC | PRN

## 2020-09-09 MED ORDER — LACTATED RINGERS IV SOLN: INTRAVENOUS | Status: DC

## 2020-09-09 NOTE — Patient Outreach (Signed)
[name]) Care Management  09/09/2020  Breanna Webster [birth year] [id number]  Patient was admitted to the hospital on 09/08/20. Nurse Health Coach will perform case closure and transfer patient to the Grand Team. Nurse has informed [facility] Coordinator of patient's admission.    Plan: RN Health Coach Discipline Closure Discipline Closure letter sent to PCP   [name] RN, [place] [phone] [email or web address]

## 2020-09-09 NOTE — Progress Notes (Addendum)
PROGRESS NOTE    Breanna Webster  OQH:[id number] DOB: 1945-12-24 DOA: 09/08/2020 PCP: [name], NP    Chief Complaint  Patient presents with  . Sepsis   . Urinary Tract Infection    Brief Narrative:  Breanna Webster is a 75 y.o. female with medical history significant of hypertension, hyperlipidemia, CVA with residual left-sided weakness and tremor, PAF on Eliquis, severe AS s/p TAVR 06/2018, CAD, mitral valve endocarditis 09/2018, PAF, diabetes mellitus type 2, thalassemia minor, NASH cirrhosis, hypothyroidism, and GERD presents with complaints of discomfort with urination and fever.   Assessment & Plan:   Principal Problem:   Sepsis secondary to UTI [facility]) Active Problems:   Hypothyroidism   Obesity (BMI 30.0-34.9)   Thalassemia minor   S/P TAVR (transcatheter aortic valve replacement)   Paroxysmal atrial fibrillation (HCC)   OSA (obstructive sleep apnea)   Elevated ALT measurement   History of CVA with residual deficit   Hyperbilirubinemia   Sepsis from E coli bacteremia and E coli UTI present on admission;  Improving sepsis physiology.  Started her on IV rocephin 2 gm daily. Follow sensitivities of the culture report.  Will need outpatient follow up with Urology.  Continue with IV fluids.  Trend lactate and wbc count.     H/o PAF:  Rate controlled.  Resume eliquis .     Insulin dependent DM uncontrolled with hyperglycemia.   CBG (last 3)  Recent Labs    09/08/20 2134 09/09/20 0111 09/09/20 0738  GLUCAP 247* 155* 153*   Resume Novolon N 25 units daily. And SSI.  Hemoglobin A1c is 7.4   Microcytic anemia/ thalassemia Minor - stool for occult blood is negative.  - anemia panel shows adequate iron and ferritin is 351. - baseline hemoglobin ranging from 9 to 11.  - currently at 9.1   Mild thrombocytopenia:  Monitor platelet count.   H/o of CVA with residual left sided weakness and tremor:  - continue with eliquis   Hyperbilirubinemia:   Resolved.    Mild hyponatremia:  Continue to monitor.    H/o TAVR in 06/2018 MV endocarditis in 09/2018 CAD Last echocardiogram on 06/2019 showed  LVEF of 60 to 65%, with grade 2 diastolic dysfunction.     Hypertension;  BP parameters on admission were borderline low,currently better.    DVT prophylaxis: Eliquis.  Code Status: (Full code) Family Communication: none at bedside.  Disposition:   Status is: Inpatient  Remains inpatient appropriate because:Ongoing diagnostic testing needed not appropriate for outpatient work up, Unsafe d/c plan, IV treatments appropriate due to intensity of illness or inability to take PO and Inpatient level of care appropriate due to severity of illness   Dispo: The patient is from: Home              Anticipated d/c is to: Home              Patient currently is not medically stable to d/c.   Difficult to place patient No       Consultants:   None.    Procedures: None.   Antimicrobials:  Antibiotics Given (last 72 hours)    Date/Time Action Medication Dose Rate   09/08/20 0549 New Bag/Given   cefTRIAXone (ROCEPHIN) 1 g in sodium chloride 0.9 % 100 mL IVPB 1 g 200 mL/hr   09/09/20 0104 New Bag/Given   cefTRIAXone (ROCEPHIN) 2 g in sodium chloride 0.9 % 100 mL IVPB 2 g 200 mL/hr  Subjective: Feeling better but not back to baseline.   Objective: Vitals:   09/08/20 2000 09/09/20 0008 09/09/20 0532 09/09/20 0534  BP: (!) 116/52 124/60 (!) 138/57   Pulse: 72 76  75  Resp: 20 20  18   Temp: 99.5 F (37.5 C) 98.6 F (37 C)  99.3 F (37.4 C)  TempSrc: Oral Oral  Oral  SpO2: 95% 100%    Weight:    83.1 kg  Height:    5' 5"  (1.651 m)    Intake/Output Summary (Last 24 hours) at 09/09/2020 1011 Last data filed at 09/09/2020 0313 Gross per 24 hour  Intake 1978.45 ml  Output 250 ml  Net 1728.45 ml   Filed Weights   09/08/20 0423 09/09/20 0534  Weight: 82.1 kg 83.1 kg    Examination:  General exam: Appears  calm and comfortable  Respiratory system: Clear to auscultation. Respiratory effort normal. Cardiovascular system: S1 & S2 heard, RRR. No JVD,No pedal edema. Gastrointestinal system: Abdomen is nondistended, soft and nontender. . Normal bowel sounds heard. Central nervous system: lethargic, but wakes up briefly and answers all questions.  Extremities: no edema or cyanosis.  Skin: No rashes seen.  Psychiatry:  Mood is appropriate.     Data Reviewed: I have personally reviewed following labs and imaging studies  CBC: Recent Labs  Lab 09/07/20 0920 09/08/20 0416 09/08/20 1637 09/09/20 0257  WBC 19.0* 12.9*  --  12.2*  NEUTROABS 17.0* 11.9*  --   --   HGB 11.1* 9.9* 8.7* 9.1*  HCT 35.1* 31.9* 27.3* 28.8*  MCV 65.0* 66.3*  --  64.9*  PLT 163 148*  --  PLATELET CLUMPS NOTED ON SMEAR, UNABLE TO ESTIMATE    Basic Metabolic Panel: Recent Labs  Lab 09/07/20 0920 09/08/20 0416 09/09/20 0257  NA 136 134* 134*  K 4.3 4.3 3.9  CL 104 105 106  CO2 21* 18* 19*  GLUCOSE 264* 214* 145*  BUN 27* 30* 22  CREATININE 0.83 0.96 0.73  CALCIUM 10.4* 9.4 9.1    GFR: Estimated Creatinine Clearance: 63.7 mL/min (by C-G formula based on SCr of 0.73 mg/dL).  Liver Function Tests: Recent Labs  Lab 09/07/20 0920 09/08/20 0416 09/09/20 0257  AST 23 41 28  ALT 54* 64* 48*  ALKPHOS 77 75 57  BILITOT 1.3* 1.3* 0.8  PROT 7.2 5.9* 5.3*  ALBUMIN 3.8 3.2* 2.7*    CBG: Recent Labs  Lab 09/08/20 1230 09/08/20 1712 09/08/20 2134 09/09/20 0111 09/09/20 0738  GLUCAP 231* 261* 247* 155* 153*     Recent Results (from the past 240 hour(s))  Blood culture (routine single)     Status: Abnormal (Preliminary result)   Collection Time: 09/08/20  4:10 AM   Specimen: BLOOD  Result Value Ref Range Status   Specimen Description BLOOD RIGHT ANTECUBITAL  Final   Special Requests   Final    BOTTLES DRAWN AEROBIC AND ANAEROBIC Blood Culture adequate volume   Culture  Setup Time   Final    GRAM  NEGATIVE RODS AEROBIC BOTTLE ONLY CRITICAL RESULT CALLED TO, READ BACK BY AND VERIFIED WITH: PHARMD GREG A. [phone] [id number] FCP    Culture (A)  Final    ESCHERICHIA COLI SUSCEPTIBILITIES TO FOLLOW Performed at [facility], [place] [address] [place], [place] [address]    Report Status PENDING  Incomplete  Blood Culture ID Panel (Reflexed)     Status: Abnormal   Collection Time: 09/08/20  4:10 AM  Result Value Ref Range Status  Enterococcus faecalis NOT DETECTED NOT DETECTED Final   Enterococcus Faecium NOT DETECTED NOT DETECTED Final   Listeria monocytogenes NOT DETECTED NOT DETECTED Final   Staphylococcus species NOT DETECTED NOT DETECTED Final   Staphylococcus aureus (BCID) NOT DETECTED NOT DETECTED Final   Staphylococcus epidermidis NOT DETECTED NOT DETECTED Final   Staphylococcus lugdunensis NOT DETECTED NOT DETECTED Final   Streptococcus species NOT DETECTED NOT DETECTED Final   Streptococcus agalactiae NOT DETECTED NOT DETECTED Final   Streptococcus pneumoniae NOT DETECTED NOT DETECTED Final   Streptococcus pyogenes NOT DETECTED NOT DETECTED Final   A.calcoaceticus-baumannii NOT DETECTED NOT DETECTED Final   Bacteroides fragilis NOT DETECTED NOT DETECTED Final   Enterobacterales DETECTED (A) NOT DETECTED Final    Comment: Enterobacterales represent a large order of gram negative bacteria, not a single organism. [place]    Enterobacter cloacae complex NOT DETECTED NOT DETECTED Final   Escherichia coli DETECTED (A) NOT DETECTED Final    Comment: [place] FILM ARRAY   Klebsiella aerogenes NOT DETECTED NOT DETECTED Final   Klebsiella oxytoca NOT DETECTED NOT DETECTED Final   Klebsiella pneumoniae NOT DETECTED NOT DETECTED Final   Proteus species NOT DETECTED NOT DETECTED Final   Salmonella species NOT DETECTED NOT DETECTED Final   Serratia marcescens NOT DETECTED NOT DETECTED Final   Haemophilus influenzae NOT DETECTED NOT DETECTED Final   Neisseria  meningitidis NOT DETECTED NOT DETECTED Final   Pseudomonas aeruginosa NOT DETECTED NOT DETECTED Final   Stenotrophomonas maltophilia NOT DETECTED NOT DETECTED Final   Candida albicans NOT DETECTED NOT DETECTED Final   Candida auris NOT DETECTED NOT DETECTED Final   Candida glabrata NOT DETECTED NOT DETECTED Final   Candida krusei NOT DETECTED NOT DETECTED Final   Candida parapsilosis NOT DETECTED NOT DETECTED Final   Candida tropicalis NOT DETECTED NOT DETECTED Final   Cryptococcus neoformans/gattii NOT DETECTED NOT DETECTED Final   CTX-M ESBL NOT DETECTED NOT DETECTED Final   Carbapenem resistance IMP NOT DETECTED NOT DETECTED Final   Carbapenem resistance KPC NOT DETECTED NOT DETECTED Final   Carbapenem resistance NDM NOT DETECTED NOT DETECTED Final   Carbapenem resist OXA 48 LIKE NOT DETECTED NOT DETECTED Final   Carbapenem resistance VIM NOT DETECTED NOT DETECTED Final    Comment: Performed at [facility], [address] [place], [place] [address]  Urine culture     Status: Abnormal (Preliminary result)   Collection Time: 09/08/20  4:16 AM   Specimen: In/Out Cath Urine  Result Value Ref Range Status   Specimen Description IN/OUT CATH URINE  Final   Special Requests NONE  Final   Culture (A)  Final    >=100,000 COLONIES/mL ESCHERICHIA COLI SUSCEPTIBILITIES TO FOLLOW Performed at [facility], [place] [address] [place], [place] [address]    Report Status PENDING  Incomplete  Resp Panel by RT-PCR (Flu A&B, Covid) Nasopharyngeal Swab     Status: None   Collection Time: 09/08/20  4:16 AM   Specimen: Nasopharyngeal Swab; Nasopharyngeal(NP) swabs in vial transport medium  Result Value Ref Range Status   SARS Coronavirus 2 by RT PCR NEGATIVE NEGATIVE Final    Comment: (NOTE) SARS-CoV-2 target nucleic acids are NOT DETECTED.  The SARS-CoV-2 RNA is generally detectable in upper respiratory specimens during the acute phase of infection. The lowest concentration of  SARS-CoV-2 viral copies this assay can detect is 138 copies/mL. A negative result does not preclude SARS-Cov-2 infection and should not be used as the sole  basis for treatment or other patient management decisions. A negative result may occur with  improper specimen collection/handling, submission of specimen other than nasopharyngeal swab, presence of viral mutation(s) within the areas targeted by this assay, and inadequate number of viral copies(<138 copies/mL). A negative result must be combined with clinical observations, patient history, and epidemiological information. The expected result is Negative.  Fact Sheet for Patients:  EntrepreneurPulse.com.au  Fact Sheet for Healthcare Providers:  IncredibleEmployment.be  This test is no t yet approved or cleared by the [place] FDA and  has been authorized for detection and/or diagnosis of SARS-CoV-2 by FDA under an Emergency Use Authorization (EUA). This EUA will remain  in effect (meaning this test can be used) for the duration of the COVID-19 declaration under Section 564(b)(1) of the Act, 21 U.S.C.section 360bbb-3(b)(1), unless the authorization is terminated  or revoked sooner.       Influenza A by PCR NEGATIVE NEGATIVE Final   Influenza B by PCR NEGATIVE NEGATIVE Final    Comment: (NOTE) The Xpert Xpress SARS-CoV-2/FLU/RSV plus assay is intended as an aid in the diagnosis of influenza from Nasopharyngeal swab specimens and should not be used as a sole basis for treatment. Nasal washings and aspirates are unacceptable for Xpert Xpress SARS-CoV-2/FLU/RSV testing.  Fact Sheet for Patients: EntrepreneurPulse.com.au  Fact Sheet for Healthcare Providers: IncredibleEmployment.be  This test is not yet approved or cleared by the [place] FDA and has been authorized for detection and/or diagnosis of SARS-CoV-2 by FDA under an Emergency Use  Authorization (EUA). This EUA will remain in effect (meaning this test can be used) for the duration of the COVID-19 declaration under Section 564(b)(1) of the Act, 21 U.S.C. section 360bbb-3(b)(1), unless the authorization is terminated or revoked.  Performed at [facility], [place] [address], [place], [place] [address]   Culture, blood (single)     Status: None (Preliminary result)   Collection Time: 09/08/20  4:37 PM   Specimen: BLOOD RIGHT HAND  Result Value Ref Range Status   Specimen Description BLOOD RIGHT HAND  Final   Special Requests   Final    BOTTLES DRAWN AEROBIC AND ANAEROBIC Blood Culture adequate volume   Culture   Final    NO GROWTH < 24 HOURS Performed at [facility], [place] [address], [place], [place] [address]    Report Status PENDING  Incomplete         Radiology Studies: DG Chest Port 1 View  Result Date: 09/08/2020 CLINICAL DATA:  Sepsis EXAM: PORTABLE CHEST 1 VIEW COMPARISON:  11/27/2019 FINDINGS: Minimal left basilar atelectasis. Lungs are otherwise clear. No pneumothorax or pleural effusion. Mild cardiomegaly is stable. Transcatheter aortic valve replacement has been performed. Pulmonary vascularity is normal. No acute bone abnormality. IMPRESSION: No active disease.  Stable cardiomegaly. Electronically Signed   By: Fidela Salisbury MD   On: 09/08/2020 04:55        Scheduled Meds: . escitalopram  20 mg Oral Daily  . insulin aspart  0-15 Units Subcutaneous TID WC  . insulin NPH Human  25 Units Subcutaneous QAC breakfast  . levothyroxine  137 mcg Oral QAC breakfast  . sodium chloride flush  3 mL Intravenous Q12H   Continuous Infusions: . sodium chloride Stopped (09/09/20 0215)  . cefTRIAXone (ROCEPHIN)  IV Stopped (09/09/20 0134)     LOS: 1 day        Hosie Poisson, MD [facility]   To contact the attending provider between 7A-7P or the  covering provider during after hours 7P-7A, please log into the web site  [email or web address] and access using universal [place] password for that web site. If you do not have the password, please call the hospital operator.  09/09/2020, 10:11 AM

## 2020-09-09 NOTE — Progress Notes (Signed)
Patient refused CPAP HS tonight. Patient stated she didn't want to wear on tonight

## 2020-09-09 NOTE — Evaluation (Signed)
Physical Therapy Evaluation Patient Details Name: Breanna Webster MRN: [id number] DOB: 02/20/1946 Today's Date: 09/09/2020   History of Present Illness  Pt is a 75 y/o female admitted secondary to sepsis from UTI. PMH includes CVA with L sided weakness/tremors, a fib, s/p TAVR, DM, and NASH cirrhosis.  Clinical Impression  Pt admitted secondary to problem above with deficits below. Pt requiring min guard to stand and take steps at EOB this session. Pt reporting increased fatigue and wanted to get some rest, so further mobility deferred. Pt reports she does not feel like she will require follow up PT at home at this time and reporting her husband can assist if needed. Will continue to follow acutely to maximize functional mobility independence and safety.     Follow Up Recommendations No PT follow up    Equipment Recommendations  None recommended by PT    Recommendations for Other Services       Precautions / Restrictions Precautions Precautions: Fall Restrictions Weight Bearing Restrictions: No      Mobility  Bed Mobility Overal bed mobility: Needs Assistance Bed Mobility: Supine to Sit;Sit to Supine     Supine to sit: Min assist Sit to supine: Supervision   General bed mobility comments: Min A for trunk elevation to come to sitting    Transfers Overall transfer level: Needs assistance Equipment used: 1 person hand held assist Transfers: Sit to/from Stand Sit to Stand: Min guard         General transfer comment: Min guard for safety  Ambulation/Gait Ambulation/Gait assistance: Min guard Gait Distance (Feet): 3 Feet Assistive device: 1 person hand held assist Gait Pattern/deviations: Step-through pattern Gait velocity: Decreased   General Gait Details: Ambulated at EOB with min guard A. No overt LOB noted. Pt reporting she is very tired as she did not get sleep, so did not want to perform further mobility.  Stairs            Wheelchair Mobility     Modified Rankin (Stroke Patients Only)       Balance Overall balance assessment: Needs assistance Sitting-balance support: No upper extremity supported;Feet supported Sitting balance-Leahy Scale: Good     Standing balance support: Single extremity supported;During functional activity Standing balance-Leahy Scale: Fair Standing balance comment: Able to maintain static standing with UE support                             Pertinent Vitals/Pain Pain Assessment: Faces Faces Pain Scale: Hurts little more Pain Location: back Pain Descriptors / Indicators: Aching Pain Intervention(s): Limited activity within patient's tolerance;Monitored during session;Repositioned    Home Living Family/patient expects to be discharged to:: Private residence Living Arrangements: Spouse/significant other Available Help at Discharge: Family;Available 24 hours/day Type of Home: House Home Access: Level entry     Home Layout: One level Home Equipment: Walker - 2 wheels;Bedside commode;Shower seat      Prior Function Level of Independence: Independent               Hand Dominance        Extremity/Trunk Assessment   Upper Extremity Assessment Upper Extremity Assessment: LUE deficits/detail LUE Deficits / Details: LUE weakness and tremors at baseline from CVA    Lower Extremity Assessment Lower Extremity Assessment: LLE deficits/detail LLE Deficits / Details: LLE weakness at baseline       Communication   Communication: No difficulties  Cognition Arousal/Alertness: Awake/alert Behavior During Therapy: [facility] 2 for tasks  assessed/performed Overall Cognitive Status: Within Functional Limits for tasks assessed                                        General Comments General comments (skin integrity, edema, etc.): Discussed HHPT vs No PT follow up and pt reports she wants to try and go home without follow up.    Exercises     Assessment/Plan    PT  Assessment Patient needs continued PT services  PT Problem List Decreased strength;Decreased balance;Decreased activity tolerance;Decreased mobility       PT Treatment Interventions DME instruction;Gait training;Functional mobility training;Stair training;Balance training;Therapeutic exercise;Therapeutic activities;Patient/family education    PT Goals (Current goals can be found in the Care Plan section)  Acute Rehab PT Goals Patient Stated Goal: to get some sleep PT Goal Formulation: With patient Time For Goal Achievement: 09/23/20 Potential to Achieve Goals: Good    Frequency Min 3X/week   Barriers to discharge        Co-evaluation               AM-PAC PT "6 Clicks" Mobility  Outcome Measure Help needed turning from your back to your side while in a flat bed without using bedrails?: A Little Help needed moving from lying on your back to sitting on the side of a flat bed without using bedrails?: A Little Help needed moving to and from a bed to a chair (including a wheelchair)?: A Little Help needed standing up from a chair using your arms (e.g., wheelchair or bedside chair)?: A Little Help needed to walk in hospital room?: A Little Help needed climbing 3-5 steps with a railing? : A Little 6 Click Score: 18    End of Session   Activity Tolerance: Patient limited by fatigue Patient left: in bed;with call bell/phone within reach;with bed alarm set Nurse Communication: Mobility status PT Visit Diagnosis: Other abnormalities of gait and mobility (R26.89);Muscle weakness (generalized) (M62.81)    Time: 6734-1937 PT Time Calculation (min) (ACUTE ONLY): 16 min   Charges:   PT Evaluation $PT Eval Low Complexity: 1 Low          Lou Miner, DPT  Acute Rehabilitation Services  Pager: [phone] Office: [phone]   Breanna Webster 09/09/2020, 10:12 AM

## 2020-09-10 ENCOUNTER — Inpatient Hospital Stay (HOSPITAL_COMMUNITY): Payer: Medicare Other

## 2020-09-10 DIAGNOSIS — I6930 Unspecified sequelae of cerebral infarction: Secondary | ICD-10-CM | POA: Diagnosis not present

## 2020-09-10 DIAGNOSIS — E038 Other specified hypothyroidism: Secondary | ICD-10-CM | POA: Diagnosis not present

## 2020-09-10 DIAGNOSIS — A419 Sepsis, unspecified organism: Secondary | ICD-10-CM | POA: Diagnosis not present

## 2020-09-10 LAB — URINE CULTURE: Culture: >=100000 — AB

## 2020-09-10 LAB — CBC WITH DIFFERENTIAL/PLATELET
Abs Immature Granulocytes: 0.03 10*3/uL (ref 0.00–0.07)
Basophils Absolute: 0 10*3/uL (ref 0.0–0.1)
Basophils Relative: 0 %
Eosinophils Absolute: 0 10*3/uL (ref 0.0–0.5)
Eosinophils Relative: 0 %
HCT: 33.2 % — ABNORMAL LOW (ref 36.0–46.0)
Hemoglobin: 10.3 g/dL — ABNORMAL LOW (ref 12.0–15.0)
Immature Granulocytes: 0 %
Lymphocytes Relative: 7 %
Lymphs Abs: 0.6 10*3/uL — ABNORMAL LOW (ref 0.7–4.0)
MCH: 20.2 pg — ABNORMAL LOW (ref 26.0–34.0)
MCHC: 31 g/dL (ref 30.0–36.0)
MCV: 65.2 fL — ABNORMAL LOW (ref 80.0–100.0)
Monocytes Absolute: 1 10*3/uL (ref 0.1–1.0)
Monocytes Relative: 12 %
Neutro Abs: 7.2 10*3/uL (ref 1.7–7.7)
Neutrophils Relative %: 81 %
Platelets: 100 10*3/uL — ABNORMAL LOW (ref 150–400)
RBC: 5.09 MIL/uL (ref 3.87–5.11)
RDW: 14.7 % (ref 11.5–15.5)
WBC: 8.9 10*3/uL (ref 4.0–10.5)
nRBC: 0 % (ref 0.0–0.2)

## 2020-09-10 LAB — GLUCOSE, CAPILLARY
Glucose-Capillary: 148 mg/dL — ABNORMAL HIGH (ref 70–99)
Glucose-Capillary: 189 mg/dL — ABNORMAL HIGH (ref 70–99)
Glucose-Capillary: 234 mg/dL — ABNORMAL HIGH (ref 70–99)
Glucose-Capillary: 95 mg/dL (ref 70–99)

## 2020-09-10 LAB — BASIC METABOLIC PANEL
Anion gap: 9 (ref 5–15)
BUN: 17 mg/dL (ref 8–23)
CO2: 21 mmol/L — ABNORMAL LOW (ref 22–32)
Calcium: 9.6 mg/dL (ref 8.9–10.3)
Chloride: 105 mmol/L (ref 98–111)
Creatinine, Ser: 0.77 mg/dL (ref 0.44–1.00)
GFR, Estimated: >60 mL/min (ref >60)
Glucose, Bld: 127 mg/dL — ABNORMAL HIGH (ref 70–99)
Potassium: 4.1 mmol/L (ref 3.5–5.1)
Sodium: 135 mmol/L (ref 135–145)

## 2020-09-10 MED ORDER — CEFAZOLIN SODIUM-DEXTROSE 2-4 GM/100ML-% IV SOLN
2.0000 g | Freq: Three times a day (TID) | INTRAVENOUS | Status: DC
  Administered 2020-09-10 – 2020-09-12 (×5): 2 g via INTRAVENOUS
  Filled 2020-09-10 (×8): qty 100

## 2020-09-10 MED ORDER — LORAZEPAM 2 MG/ML IJ SOLN
0.5000 mg | Freq: Once | INTRAMUSCULAR | Status: AC
  Administered 2020-09-10: 0.5 mg via INTRAVENOUS
  Filled 2020-09-10: qty 1

## 2020-09-10 NOTE — Progress Notes (Signed)
Pt has refused cpap at this time stating after being asked if she wore one at home and [facility] she want to wear one here that she "prefers to wear her own" and would not like to wear our machine "unless she was dying."  RT instructed pt to let staff know if she changed her mind.  RT will continue to monitor.

## 2020-09-10 NOTE — Progress Notes (Signed)
PROGRESS NOTE    Breanna Webster  YTK:[id number] DOB: 07-Apr-1946 DOA: 09/08/2020 PCP: [name], NP    Chief Complaint  Patient presents with  . Sepsis   . Urinary Tract Infection    Brief Narrative:  Breanna Webster is a 75 y.o. female with medical history significant of hypertension, hyperlipidemia, CVA with residual left-sided weakness and tremor, PAF on Eliquis, severe AS s/p TAVR 06/2018, CAD, mitral valve endocarditis 09/2018, PAF, diabetes mellitus type 2, thalassemia minor, NASH cirrhosis, hypothyroidism, and GERD presents with complaints of discomfort with urination and fever.  She was admitted for sepsis from UTI.   Assessment & Plan:   Principal Problem:   Sepsis secondary to UTI [facility]) Active Problems:   Hypothyroidism   Obesity (BMI 30.0-34.9)   Thalassemia minor   S/P TAVR (transcatheter aortic valve replacement)   Paroxysmal atrial fibrillation (HCC)   OSA (obstructive sleep apnea)   Elevated ALT measurement   History of CVA with residual deficit   Hyperbilirubinemia   Sepsis from E coli bacteremia and E coli UTI present on admission;  Improving sepsis physiology.  Started her on IV rocephin 2 gm daily. Sensitivities are reviewed.  Will need outpatient follow up with Urology.  Continue with IV fluids. Lactic acid and wbc count normalized.  Plan for 10 to 14 day treatment.  Persistent fevers earlier this am with 100.6. will get repeat Blood cultures.     H/o PAF:  Rate controlled.  Resume eliquis .     Insulin dependent DM uncontrolled with hyperglycemia.   CBG (last 3)  Recent Labs    09/09/20 2034 09/09/20 2149 09/10/20 0746  GLUCAP 218* 177* 148*   Resume Novolon N 25 units daily. And SSI.  Hemoglobin A1c is 7.4 No changes in meds.    Microcytic anemia/ thalassemia Minor - stool for occult blood is negative.  - anemia panel shows adequate iron and ferritin is 351. - baseline hemoglobin ranging from 9 to 11.  Hemoglobin around  10.3   Mild thrombocytopenia:  Platelet count at 100,000  H/o of CVA with residual left sided weakness and tremor:  - continue with eliquis   Hyperbilirubinemia:  Resolved.    Mild hyponatremia:  Resolved with hydration.  Continue to monitor.    H/o TAVR in 06/2018 MV endocarditis in 09/2018 CAD Last echocardiogram on 06/2019 showed  LVEF of 60 to 65%, with grade 2 diastolic dysfunction.     Hypertension;  Well controlled BP parameters.    DVT prophylaxis: Eliquis.  Code Status: (Full code) Family Communication: none at bedside. Discussed with son at bedside.  Disposition:   Status is: Inpatient  Remains inpatient appropriate because:Ongoing diagnostic testing needed not appropriate for outpatient work up, Unsafe d/c plan, IV treatments appropriate due to intensity of illness or inability to take PO and Inpatient level of care appropriate due to severity of illness   Dispo: The patient is from: Home              Anticipated d/c is to: Home              Patient currently is not medically stable to d/c.   Difficult to place patient No       Consultants:   None.    Procedures: None.   Antimicrobials:  Antibiotics Given (last 72 hours)    Date/Time Action Medication Dose Rate   09/08/20 0549 New Bag/Given   cefTRIAXone (ROCEPHIN) 1 g in sodium chloride 0.9 % 100 mL IVPB  1 g 200 mL/hr   09/09/20 0104 New Bag/Given   cefTRIAXone (ROCEPHIN) 2 g in sodium chloride 0.9 % 100 mL IVPB 2 g 200 mL/hr   09/09/20 2233 New Bag/Given   cefTRIAXone (ROCEPHIN) 2 g in sodium chloride 0.9 % 100 mL IVPB 2 g 200 mL/hr         Subjective: Lethargic this am fromt he ativan given this am.  No chest pain or sob, no back pain or flank pain.   Objective: Vitals:   09/09/20 1640 09/09/20 1927 09/10/20 0520 09/10/20 0750  BP:  (!) 131/57 (!) 128/58   Pulse:  79 76 76  Resp:  18 18 18   Temp: (!) 100.6 F (38.1 C) 98.4 F (36.9 C) 98.1 F (36.7 C) 98.2 F (36.8 C)   TempSrc:  Oral Oral Oral  SpO2:    97%  Weight:      Height:        Intake/Output Summary (Last 24 hours) at 09/10/2020 1111 Last data filed at 09/10/2020 0700 Gross per 24 hour  Intake 1861.75 ml  Output 200 ml  Net 1661.75 ml   Filed Weights   09/08/20 0423 09/09/20 0534  Weight: 82.1 kg 83.1 kg    Examination:  General exam: elderly woman, not in distress.  Respiratory system: clear to auscultation, no wheezing heard.  Cardiovascular system: S1S2, RRR, no JVD, no pedal edema.  Gastrointestinal system: Abdomen is soft, non tender non distended bowe sounds wnl Central nervous system: lethargic from ativan given this am. Able to move all extremities,and answers all questions.  Extremities: no edema.  Skin: No rashes seen.  Psychiatry:  Mood is appropriate..     Data Reviewed: I have personally reviewed following labs and imaging studies  CBC: Recent Labs  Lab 09/07/20 0920 09/08/20 0416 09/08/20 1637 09/09/20 0257 09/10/20 0141  WBC 19.0* 12.9*  --  12.2* 8.9  NEUTROABS 17.0* 11.9*  --   --  7.2  HGB 11.1* 9.9* 8.7* 9.1* 10.3*  HCT 35.1* 31.9* 27.3* 28.8* 33.2*  MCV 65.0* 66.3*  --  64.9* 65.2*  PLT 163 148*  --  PLATELET CLUMPS NOTED ON SMEAR, UNABLE TO ESTIMATE 100*    Basic Metabolic Panel: Recent Labs  Lab 09/07/20 0920 09/08/20 0416 09/09/20 0257 09/10/20 0141  NA 136 134* 134* 135  K 4.3 4.3 3.9 4.1  CL 104 105 106 105  CO2 21* 18* 19* 21*  GLUCOSE 264* 214* 145* 127*  BUN 27* 30* 22 17  CREATININE 0.83 0.96 0.73 0.77  CALCIUM 10.4* 9.4 9.1 9.6    GFR: Estimated Creatinine Clearance: 63.7 mL/min (by C-G formula based on SCr of 0.77 mg/dL).  Liver Function Tests: Recent Labs  Lab 09/07/20 0920 09/08/20 0416 09/09/20 0257  AST 23 41 28  ALT 54* 64* 48*  ALKPHOS 77 75 57  BILITOT 1.3* 1.3* 0.8  PROT 7.2 5.9* 5.3*  ALBUMIN 3.8 3.2* 2.7*    CBG: Recent Labs  Lab 09/09/20 1131 09/09/20 1735 09/09/20 2034 09/09/20 2149  09/10/20 0746  GLUCAP 173* 133* 218* 177* 148*     Recent Results (from the past 240 hour(s))  Urine Culture     Status: Abnormal   Collection Time: 09/07/20  3:15 PM   Specimen: Urine  Result Value Ref Range Status   MICRO NUMBER: [id number]  Final   SPECIMEN QUALITY: Adequate  Final   Sample Source NOT GIVEN  Final   STATUS: FINAL  Final   ISOLATE 1: Escherichia  coli (A)  Final    Comment: Greater than 100,000 CFU/mL of Escherichia coli      Susceptibility   Escherichia coli - URINE CULTURE, REFLEX    AMOX/CLAVULANIC 4 Sensitive     AMPICILLIN >=32 Resistant     AMPICILLIN/SULBACTAM >=32 Resistant     CEFAZOLIN* <=4 Not Reportable      * For infections other than uncomplicated UTIcaused by E. coli, K. pneumoniae or P. mirabilis:Cefazolin is resistant if MIC > or = 8 mcg/mL.(Distinguishing susceptible versus intermediatefor isolates with MIC < or = 4 mcg/mL requiresadditional testing.)For uncomplicated UTI caused by E. coli,K. pneumoniae or P. mirabilis: Cefazolin issusceptible if MIC <32 mcg/mL and predictssusceptible to the oral agents cefaclor, cefdinir,cefpodoxime, cefprozil, cefuroxime, cephalexinand loracarbef.    CEFEPIME <=1 Sensitive     CEFTRIAXONE <=1 Sensitive     CIPROFLOXACIN <=0.25 Sensitive     LEVOFLOXACIN <=0.12 Sensitive     ERTAPENEM <=0.5 Sensitive     GENTAMICIN <=1 Sensitive     IMIPENEM <=0.25 Sensitive     NITROFURANTOIN <=16 Sensitive     PIP/TAZO <=4 Sensitive     TOBRAMYCIN <=1 Sensitive     TRIMETH/SULFA* <=20 Sensitive      * For infections other than uncomplicated UTIcaused by E. coli, K. pneumoniae or P. mirabilis:Cefazolin is resistant if MIC > or = 8 mcg/mL.(Distinguishing susceptible versus intermediatefor isolates with MIC < or = 4 mcg/mL requiresadditional testing.)For uncomplicated UTI caused by E. coli,K. pneumoniae or P. mirabilis: Cefazolin issusceptible if MIC <32 mcg/mL and predictssusceptible to the oral agents cefaclor,  cefdinir,cefpodoxime, cefprozil, cefuroxime, cephalexinand loracarbef.Legend:S = Susceptible  I = IntermediateR = Resistant  NS = Not susceptible* = Not tested  NR = Not reported**NN = See antimicrobic comments  Blood culture (routine single)     Status: Abnormal   Collection Time: 09/08/20  4:10 AM   Specimen: BLOOD  Result Value Ref Range Status   Specimen Description BLOOD RIGHT ANTECUBITAL  Final   Special Requests   Final    BOTTLES DRAWN AEROBIC AND ANAEROBIC Blood Culture adequate volume   Culture  Setup Time   Final    GRAM NEGATIVE RODS AEROBIC BOTTLE ONLY CRITICAL RESULT CALLED TO, READ BACK BY AND VERIFIED WITH: PHARMD GREG A. [phone] [id number] FCP Performed at [facility], [place] [address], [place], [place] [address]    Culture ESCHERICHIA COLI (A)  Final   Report Status 09/10/2020 FINAL  Final   Organism ID, Bacteria ESCHERICHIA COLI  Final      Susceptibility   Escherichia coli - MIC*    AMPICILLIN >=32 RESISTANT Resistant     CEFAZOLIN <=4 SENSITIVE Sensitive     CEFEPIME <=0.12 SENSITIVE Sensitive     CEFTAZIDIME <=1 SENSITIVE Sensitive     CEFTRIAXONE <=0.25 SENSITIVE Sensitive     CIPROFLOXACIN <=0.25 SENSITIVE Sensitive     GENTAMICIN <=1 SENSITIVE Sensitive     IMIPENEM <=0.25 SENSITIVE Sensitive     TRIMETH/SULFA <=20 SENSITIVE Sensitive     AMPICILLIN/SULBACTAM >=32 RESISTANT Resistant     PIP/TAZO <=4 SENSITIVE Sensitive     * ESCHERICHIA COLI  Blood Culture ID Panel (Reflexed)     Status: Abnormal   Collection Time: 09/08/20  4:10 AM  Result Value Ref Range Status   Enterococcus faecalis NOT DETECTED NOT DETECTED Final   Enterococcus Faecium NOT DETECTED NOT DETECTED Final   Listeria monocytogenes NOT DETECTED NOT DETECTED Final   Staphylococcus species NOT DETECTED NOT DETECTED Final   Staphylococcus  aureus (BCID) NOT DETECTED NOT DETECTED Final   Staphylococcus epidermidis NOT DETECTED NOT DETECTED Final   Staphylococcus lugdunensis NOT DETECTED NOT  DETECTED Final   Streptococcus species NOT DETECTED NOT DETECTED Final   Streptococcus agalactiae NOT DETECTED NOT DETECTED Final   Streptococcus pneumoniae NOT DETECTED NOT DETECTED Final   Streptococcus pyogenes NOT DETECTED NOT DETECTED Final   A.calcoaceticus-baumannii NOT DETECTED NOT DETECTED Final   Bacteroides fragilis NOT DETECTED NOT DETECTED Final   Enterobacterales DETECTED (A) NOT DETECTED Final    Comment: Enterobacterales represent a large order of gram negative bacteria, not a single organism. [place]    Enterobacter cloacae complex NOT DETECTED NOT DETECTED Final   Escherichia coli DETECTED (A) NOT DETECTED Final    Comment: [place] FILM ARRAY   Klebsiella aerogenes NOT DETECTED NOT DETECTED Final   Klebsiella oxytoca NOT DETECTED NOT DETECTED Final   Klebsiella pneumoniae NOT DETECTED NOT DETECTED Final   Proteus species NOT DETECTED NOT DETECTED Final   Salmonella species NOT DETECTED NOT DETECTED Final   Serratia marcescens NOT DETECTED NOT DETECTED Final   Haemophilus influenzae NOT DETECTED NOT DETECTED Final   Neisseria meningitidis NOT DETECTED NOT DETECTED Final   Pseudomonas aeruginosa NOT DETECTED NOT DETECTED Final   Stenotrophomonas maltophilia NOT DETECTED NOT DETECTED Final   Candida albicans NOT DETECTED NOT DETECTED Final   Candida auris NOT DETECTED NOT DETECTED Final   Candida glabrata NOT DETECTED NOT DETECTED Final   Candida krusei NOT DETECTED NOT DETECTED Final   Candida parapsilosis NOT DETECTED NOT DETECTED Final   Candida tropicalis NOT DETECTED NOT DETECTED Final   Cryptococcus neoformans/gattii NOT DETECTED NOT DETECTED Final   CTX-M ESBL NOT DETECTED NOT DETECTED Final   Carbapenem resistance IMP NOT DETECTED NOT DETECTED Final   Carbapenem resistance KPC NOT DETECTED NOT DETECTED Final   Carbapenem resistance NDM NOT DETECTED NOT DETECTED Final   Carbapenem resist OXA 48 LIKE NOT DETECTED NOT DETECTED Final    Carbapenem resistance VIM NOT DETECTED NOT DETECTED Final    Comment: Performed at [facility], [address] [place], [place] [address]  Urine culture     Status: Abnormal   Collection Time: 09/08/20  4:16 AM   Specimen: In/Out Cath Urine  Result Value Ref Range Status   Specimen Description IN/OUT CATH URINE  Final   Special Requests   Final    NONE Performed at [facility], [place] [address], [place], [place] [address]    Culture >=100,000 COLONIES/mL ESCHERICHIA COLI (A)  Final   Report Status 09/10/2020 FINAL  Final   Organism ID, Bacteria ESCHERICHIA COLI (A)  Final      Susceptibility   Escherichia coli - MIC*    AMPICILLIN >=32 RESISTANT Resistant     CEFAZOLIN <=4 SENSITIVE Sensitive     CEFEPIME <=0.12 SENSITIVE Sensitive     CEFTRIAXONE <=0.25 SENSITIVE Sensitive     CIPROFLOXACIN <=0.25 SENSITIVE Sensitive     GENTAMICIN <=1 SENSITIVE Sensitive     IMIPENEM <=0.25 SENSITIVE Sensitive     NITROFURANTOIN <=16 SENSITIVE Sensitive     TRIMETH/SULFA <=20 SENSITIVE Sensitive     AMPICILLIN/SULBACTAM >=32 RESISTANT Resistant     PIP/TAZO <=4 SENSITIVE Sensitive     * >=100,000 COLONIES/mL ESCHERICHIA COLI  Resp Panel by RT-PCR (Flu A&B, Covid) Nasopharyngeal Swab     Status: None   Collection Time: 09/08/20  4:16 AM   Specimen: Nasopharyngeal Swab; Nasopharyngeal(NP) swabs in vial  transport medium  Result Value Ref Range Status   SARS Coronavirus 2 by RT PCR NEGATIVE NEGATIVE Final    Comment: (NOTE) SARS-CoV-2 target nucleic acids are NOT DETECTED.  The SARS-CoV-2 RNA is generally detectable in upper respiratory specimens during the acute phase of infection. The lowest concentration of SARS-CoV-2 viral copies this assay can detect is 138 copies/mL. A negative result does not preclude SARS-Cov-2 infection and should not be used as the sole basis for treatment or other patient management decisions. A negative result may occur with  improper specimen  collection/handling, submission of specimen other than nasopharyngeal swab, presence of viral mutation(s) within the areas targeted by this assay, and inadequate number of viral copies(<138 copies/mL). A negative result must be combined with clinical observations, patient history, and epidemiological information. The expected result is Negative.  Fact Sheet for Patients:  EntrepreneurPulse.com.au  Fact Sheet for Healthcare Providers:  IncredibleEmployment.be  This test is no t yet approved or cleared by the [place] FDA and  has been authorized for detection and/or diagnosis of SARS-CoV-2 by FDA under an Emergency Use Authorization (EUA). This EUA will remain  in effect (meaning this test can be used) for the duration of the COVID-19 declaration under Section 564(b)(1) of the Act, 21 U.S.C.section 360bbb-3(b)(1), unless the authorization is terminated  or revoked sooner.       Influenza A by PCR NEGATIVE NEGATIVE Final   Influenza B by PCR NEGATIVE NEGATIVE Final    Comment: (NOTE) The Xpert Xpress SARS-CoV-2/FLU/RSV plus assay is intended as an aid in the diagnosis of influenza from Nasopharyngeal swab specimens and should not be used as a sole basis for treatment. Nasal washings and aspirates are unacceptable for Xpert Xpress SARS-CoV-2/FLU/RSV testing.  Fact Sheet for Patients: EntrepreneurPulse.com.au  Fact Sheet for Healthcare Providers: IncredibleEmployment.be  This test is not yet approved or cleared by the [place] FDA and has been authorized for detection and/or diagnosis of SARS-CoV-2 by FDA under an Emergency Use Authorization (EUA). This EUA will remain in effect (meaning this test can be used) for the duration of the COVID-19 declaration under Section 564(b)(1) of the Act, 21 U.S.C. section 360bbb-3(b)(1), unless the authorization is terminated or revoked.  Performed at [facility], [place] [address], [place], [place] [address]   Culture, blood (single)     Status: None (Preliminary result)   Collection Time: 09/08/20  4:37 PM   Specimen: BLOOD RIGHT HAND  Result Value Ref Range Status   Specimen Description BLOOD RIGHT HAND  Final   Special Requests   Final    BOTTLES DRAWN AEROBIC AND ANAEROBIC Blood Culture adequate volume   Culture   Final    NO GROWTH < 24 HOURS Performed at [facility], [place] [address], [place], [place] [address]    Report Status PENDING  Incomplete         Radiology Studies: No results found.      Scheduled Meds: . apixaban  5 mg Oral BID  . escitalopram  20 mg Oral Daily  . insulin aspart  0-15 Units Subcutaneous TID WC  . insulin NPH Human  25 Units Subcutaneous QAC breakfast  . levothyroxine  137 mcg Oral QAC breakfast  . sodium chloride flush  3 mL Intravenous Q12H   Continuous Infusions: . sodium chloride Stopped (09/09/20 0215)  . cefTRIAXone (ROCEPHIN)  IV Stopped (09/09/20 2330)  . lactated ringers 100 mL/hr at 09/10/20 0644     LOS: 2 days  Hosie Poisson, MD [facility]   To contact the attending provider between 7A-7P or the covering provider during after hours 7P-7A, please log into the web site [email or web address] and access using universal [place] password for that web site. If you do not have the password, please call the hospital operator.  09/10/2020, 11:11 AM

## 2020-09-11 DIAGNOSIS — E038 Other specified hypothyroidism: Secondary | ICD-10-CM | POA: Diagnosis not present

## 2020-09-11 DIAGNOSIS — E039 Hypothyroidism, unspecified: Secondary | ICD-10-CM | POA: Diagnosis not present

## 2020-09-11 DIAGNOSIS — I6930 Unspecified sequelae of cerebral infarction: Secondary | ICD-10-CM | POA: Diagnosis not present

## 2020-09-11 DIAGNOSIS — E669 Obesity, unspecified: Secondary | ICD-10-CM | POA: Diagnosis not present

## 2020-09-11 DIAGNOSIS — N390 Urinary tract infection, site not specified: Secondary | ICD-10-CM | POA: Diagnosis not present

## 2020-09-11 DIAGNOSIS — A419 Sepsis, unspecified organism: Secondary | ICD-10-CM | POA: Diagnosis not present

## 2020-09-11 LAB — GLUCOSE, CAPILLARY
Glucose-Capillary: 122 mg/dL — ABNORMAL HIGH (ref 70–99)
Glucose-Capillary: 223 mg/dL — ABNORMAL HIGH (ref 70–99)
Glucose-Capillary: 237 mg/dL — ABNORMAL HIGH (ref 70–99)
Glucose-Capillary: 81 mg/dL (ref 70–99)
Glucose-Capillary: 101 mg/dL — ABNORMAL HIGH (ref 70–99)

## 2020-09-11 LAB — CBC WITH DIFFERENTIAL/PLATELET
Abs Immature Granulocytes: 0.03 10*3/uL (ref 0.00–0.07)
Basophils Absolute: 0 10*3/uL (ref 0.0–0.1)
Basophils Relative: 1 %
Eosinophils Absolute: 0 10*3/uL (ref 0.0–0.5)
Eosinophils Relative: 1 %
HCT: 29.1 % — ABNORMAL LOW (ref 36.0–46.0)
Hemoglobin: 9.3 g/dL — ABNORMAL LOW (ref 12.0–15.0)
Immature Granulocytes: 1 %
Lymphocytes Relative: 15 %
Lymphs Abs: 0.6 10*3/uL — ABNORMAL LOW (ref 0.7–4.0)
MCH: 20.4 pg — ABNORMAL LOW (ref 26.0–34.0)
MCHC: 32 g/dL (ref 30.0–36.0)
MCV: 63.8 fL — ABNORMAL LOW (ref 80.0–100.0)
Monocytes Absolute: 0.6 10*3/uL (ref 0.1–1.0)
Monocytes Relative: 15 %
Neutro Abs: 2.6 10*3/uL (ref 1.7–7.7)
Neutrophils Relative %: 67 %
Platelets: 95 10*3/uL — ABNORMAL LOW (ref 150–400)
RBC: 4.56 MIL/uL (ref 3.87–5.11)
RDW: 14.5 % (ref 11.5–15.5)
WBC: 3.9 10*3/uL — ABNORMAL LOW (ref 4.0–10.5)
nRBC: 0 % (ref 0.0–0.2)

## 2020-09-11 LAB — BASIC METABOLIC PANEL
Anion gap: 8 (ref 5–15)
BUN: 16 mg/dL (ref 8–23)
CO2: 23 mmol/L (ref 22–32)
Calcium: 9.2 mg/dL (ref 8.9–10.3)
Chloride: 106 mmol/L (ref 98–111)
Creatinine, Ser: 0.74 mg/dL (ref 0.44–1.00)
GFR, Estimated: >60 mL/min (ref >60)
Glucose, Bld: 106 mg/dL — ABNORMAL HIGH (ref 70–99)
Potassium: 3.6 mmol/L (ref 3.5–5.1)
Sodium: 137 mmol/L (ref 135–145)

## 2020-09-11 MED ORDER — MELATONIN 3 MG PO TABS
3.0000 mg | ORAL_TABLET | Freq: Every day | ORAL | Status: DC
  Administered 2020-09-11: 3 mg via ORAL
  Filled 2020-09-11: qty 1

## 2020-09-11 NOTE — Progress Notes (Signed)
PROGRESS NOTE    Breanna Webster  BTD:[id number] DOB: [birth year] DOA: 09/08/2020 PCP: [name], NP    Chief Complaint  Patient presents with  . Sepsis   . Urinary Tract Infection    Brief Narrative:  Breanna Webster is a 75 y.o. female with medical history significant of hypertension, hyperlipidemia, CVA with residual left-sided weakness and tremor, PAF on Eliquis, severe AS s/p TAVR 06/2018, CAD, mitral valve endocarditis 09/2018, PAF, diabetes mellitus type 2, thalassemia minor, NASH cirrhosis, hypothyroidism, and GERD presents with complaints of discomfort with urination and fever.  She was admitted for sepsis from UTI.  Pt spiking fevers every day, low grade , . ID consulted for further evaluation.  Assessment & Plan:   Principal Problem:   Sepsis secondary to UTI [facility]) Active Problems:   Hypothyroidism   Obesity (BMI 30.0-34.9)   Thalassemia minor   S/P TAVR (transcatheter aortic valve replacement)   Paroxysmal atrial fibrillation (HCC)   OSA (obstructive sleep apnea)   Elevated ALT measurement   History of CVA with residual deficit   Hyperbilirubinemia   Sepsis from E coli bacteremia and E coli UTI present on admission;  Improving sepsis physiology.  Started her on IV rocephin 2 gm daily. Sensitivities are reviewed, transitioned to ancef. Plan for 10 day antibiotic treatment. Will need outpatient follow up with Urology.  Continue with IV fluids. Lactic acid and wbc count normalized.  Persistently febrile , repeated blood cultures, have been negative so far.  ID consulted for recommendations.  No further work up needed at this time.     H/o PAF:  Rate controlled.  Resume eliquis .     Insulin dependent DM uncontrolled with hyperglycemia.   CBG (last 3)  Recent Labs    09/10/20 2058 09/11/20 0724 09/11/20 1122  GLUCAP 95 122* 223*   Resume Novolon N 25 units daily. And SSI.  Hemoglobin A1c is 7.4 No changes in meds.    Microcytic anemia/  thalassemia Minor - stool for occult blood is negative.  - anemia panel shows adequate iron and ferritin is 351. - baseline hemoglobin ranging from 9 to 11.  - no bleeding.    Mild thrombocytopenia:  No signs of bleeding.   H/o of CVA with residual left sided weakness and tremor:  - continue with eliquis   Hyperbilirubinemia:  Resolved.    Mild hyponatremia:  Resolved with hydration.  Continue to monitor.    H/o TAVR in 06/2018 MV endocarditis in 09/2018 CAD Last echocardiogram on 06/2019 showed  LVEF of 60 to 65%, with grade 2 diastolic dysfunction.     Hypertension;  Bp parameters are optimal.    DVT prophylaxis: Eliquis.  Code Status: (Full code) Family Communication: none at bedside. Discussed with son and husband.   Disposition:   Status is: Inpatient  Remains inpatient appropriate because:Ongoing diagnostic testing needed not appropriate for outpatient work up, Unsafe d/c plan, IV treatments appropriate due to intensity of illness or inability to take PO and Inpatient level of care appropriate due to severity of illness   Dispo: The patient is from: Home              Anticipated d/c is to: Home              Patient currently is not medically stable to d/c.   Difficult to place patient No       Consultants:   ID   Procedures: None.   Antimicrobials:  Antibiotics Given (last 72  hours)    Date/Time Action Medication Dose Rate   09/09/20 0104 New Bag/Given   cefTRIAXone (ROCEPHIN) 2 g in sodium chloride 0.9 % 100 mL IVPB 2 g 200 mL/hr   09/09/20 2233 New Bag/Given   cefTRIAXone (ROCEPHIN) 2 g in sodium chloride 0.9 % 100 mL IVPB 2 g 200 mL/hr   09/10/20 2349 New Bag/Given   ceFAZolin (ANCEF) IVPB 2g/100 mL premix 2 g 200 mL/hr   09/11/20 0702 New Bag/Given   ceFAZolin (ANCEF) IVPB 2g/100 mL premix 2 g 200 mL/hr         Subjective: PT wants to know when she can be discharged  Objective: Vitals:   09/10/20 2009 09/11/20 0100 09/11/20 0500  09/11/20 0915  BP: (!) 143/76  132/65 128/66  Pulse: 76  68 61  Resp: 16     Temp: (!) 100.8 F (38.2 C) 97.9 F (36.6 C) 99.1 F (37.3 C) 100.2 F (37.9 C)  TempSrc: Oral Oral Oral Oral  SpO2: 97%  95% 90%  Weight:      Height:        Intake/Output Summary (Last 24 hours) at 09/11/2020 1430 Last data filed at 09/11/2020 0457 Gross per 24 hour  Intake 2335.64 ml  Output --  Net 2335.64 ml   Filed Weights   09/08/20 0423 09/09/20 0534  Weight: 82.1 kg 83.1 kg    Examination:  General exam: Well-developed lady not in any kind of distress Respiratory system: Clear to auscultation bilaterally, no wheezing or rhonchi Cardiovascular system: S1-S2 heard, regular rate rhythm, no JVD, no pedal edema Gastrointestinal system: Abdomen is soft nontender nondistended, no flank pain, bowel sounds within normal limits Central nervous system: More alert and answering questions appropriately Extremities: No pedal edema Skin: No rashes seen Psychiatry:  Mood is appropriate..     Data Reviewed: I have personally reviewed following labs and imaging studies  CBC: Recent Labs  Lab 09/07/20 0920 09/08/20 0416 09/08/20 1637 09/09/20 0257 09/10/20 0141 09/11/20 0320  WBC 19.0* 12.9*  --  12.2* 8.9 3.9*  NEUTROABS 17.0* 11.9*  --   --  7.2 2.6  HGB 11.1* 9.9* 8.7* 9.1* 10.3* 9.3*  HCT 35.1* 31.9* 27.3* 28.8* 33.2* 29.1*  MCV 65.0* 66.3*  --  64.9* 65.2* 63.8*  PLT 163 148*  --  PLATELET CLUMPS NOTED ON SMEAR, UNABLE TO ESTIMATE 100* 95*    Basic Metabolic Panel: Recent Labs  Lab 09/07/20 0920 09/08/20 0416 09/09/20 0257 09/10/20 0141 09/11/20 0320  NA 136 134* 134* 135 137  K 4.3 4.3 3.9 4.1 3.6  CL 104 105 106 105 106  CO2 21* 18* 19* 21* 23  GLUCOSE 264* 214* 145* 127* 106*  BUN 27* 30* 22 17 16   CREATININE 0.83 0.96 0.73 0.77 0.74  CALCIUM 10.4* 9.4 9.1 9.6 9.2    GFR: Estimated Creatinine Clearance: 63.7 mL/min (by C-G formula based on SCr of 0.74 mg/dL).  Liver  Function Tests: Recent Labs  Lab 09/07/20 0920 09/08/20 0416 09/09/20 0257  AST 23 41 28  ALT 54* 64* 48*  ALKPHOS 77 75 57  BILITOT 1.3* 1.3* 0.8  PROT 7.2 5.9* 5.3*  ALBUMIN 3.8 3.2* 2.7*    CBG: Recent Labs  Lab 09/10/20 1213 09/10/20 1702 09/10/20 2058 09/11/20 0724 09/11/20 1122  GLUCAP 189* 234* 95 122* 223*     Recent Results (from the past 240 hour(s))  Urine Culture     Status: Abnormal   Collection Time: 09/07/20  3:15 PM  Specimen: Urine  Result Value Ref Range Status   MICRO NUMBER: [id number]  Final   SPECIMEN QUALITY: Adequate  Final   Sample Source NOT GIVEN  Final   STATUS: FINAL  Final   ISOLATE 1: Escherichia coli (A)  Final    Comment: Greater than 100,000 CFU/mL of Escherichia coli      Susceptibility   Escherichia coli - URINE CULTURE, REFLEX    AMOX/CLAVULANIC 4 Sensitive     AMPICILLIN >=32 Resistant     AMPICILLIN/SULBACTAM >=32 Resistant     CEFAZOLIN* <=4 Not Reportable      * For infections other than uncomplicated UTIcaused by E. coli, K. pneumoniae or P. mirabilis:Cefazolin is resistant if MIC > or = 8 mcg/mL.(Distinguishing susceptible versus intermediatefor isolates with MIC < or = 4 mcg/mL requiresadditional testing.)For uncomplicated UTI caused by E. coli,K. pneumoniae or P. mirabilis: Cefazolin issusceptible if MIC <32 mcg/mL and predictssusceptible to the oral agents cefaclor, cefdinir,cefpodoxime, cefprozil, cefuroxime, cephalexinand loracarbef.    CEFEPIME <=1 Sensitive     CEFTRIAXONE <=1 Sensitive     CIPROFLOXACIN <=0.25 Sensitive     LEVOFLOXACIN <=0.12 Sensitive     ERTAPENEM <=0.5 Sensitive     GENTAMICIN <=1 Sensitive     IMIPENEM <=0.25 Sensitive     NITROFURANTOIN <=16 Sensitive     PIP/TAZO <=4 Sensitive     TOBRAMYCIN <=1 Sensitive     TRIMETH/SULFA* <=20 Sensitive      * For infections other than uncomplicated UTIcaused by E. coli, K. pneumoniae or P. mirabilis:Cefazolin is resistant if MIC > or = 8  mcg/mL.(Distinguishing susceptible versus intermediatefor isolates with MIC < or = 4 mcg/mL requiresadditional testing.)For uncomplicated UTI caused by E. coli,K. pneumoniae or P. mirabilis: Cefazolin issusceptible if MIC <32 mcg/mL and predictssusceptible to the oral agents cefaclor, cefdinir,cefpodoxime, cefprozil, cefuroxime, cephalexinand loracarbef.Legend:S = Susceptible  I = IntermediateR = Resistant  NS = Not susceptible* = Not tested  NR = Not reported**NN = See antimicrobic comments  Blood culture (routine single)     Status: Abnormal   Collection Time: 09/08/20  4:10 AM   Specimen: BLOOD  Result Value Ref Range Status   Specimen Description BLOOD RIGHT ANTECUBITAL  Final   Special Requests   Final    BOTTLES DRAWN AEROBIC AND ANAEROBIC Blood Culture adequate volume   Culture  Setup Time   Final    GRAM NEGATIVE RODS AEROBIC BOTTLE ONLY CRITICAL RESULT CALLED TO, READ BACK BY AND VERIFIED WITH: PHARMD GREG A. [id number] [id number] FCP Performed at [facility], [place] [address], [place], [place] [address]    Culture ESCHERICHIA COLI (A)  Final   Report Status 09/10/2020 FINAL  Final   Organism ID, Bacteria ESCHERICHIA COLI  Final      Susceptibility   Escherichia coli - MIC*    AMPICILLIN >=32 RESISTANT Resistant     CEFAZOLIN <=4 SENSITIVE Sensitive     CEFEPIME <=0.12 SENSITIVE Sensitive     CEFTAZIDIME <=1 SENSITIVE Sensitive     CEFTRIAXONE <=0.25 SENSITIVE Sensitive     CIPROFLOXACIN <=0.25 SENSITIVE Sensitive     GENTAMICIN <=1 SENSITIVE Sensitive     IMIPENEM <=0.25 SENSITIVE Sensitive     TRIMETH/SULFA <=20 SENSITIVE Sensitive     AMPICILLIN/SULBACTAM >=32 RESISTANT Resistant     PIP/TAZO <=4 SENSITIVE Sensitive     * ESCHERICHIA COLI  Blood Culture ID Panel (Reflexed)     Status: Abnormal   Collection Time: 09/08/20  4:10 AM  Result Value Ref  Range Status   Enterococcus faecalis NOT DETECTED NOT DETECTED Final   Enterococcus Faecium NOT DETECTED NOT DETECTED Final    Listeria monocytogenes NOT DETECTED NOT DETECTED Final   Staphylococcus species NOT DETECTED NOT DETECTED Final   Staphylococcus aureus (BCID) NOT DETECTED NOT DETECTED Final   Staphylococcus epidermidis NOT DETECTED NOT DETECTED Final   Staphylococcus lugdunensis NOT DETECTED NOT DETECTED Final   Streptococcus species NOT DETECTED NOT DETECTED Final   Streptococcus agalactiae NOT DETECTED NOT DETECTED Final   Streptococcus pneumoniae NOT DETECTED NOT DETECTED Final   Streptococcus pyogenes NOT DETECTED NOT DETECTED Final   A.calcoaceticus-baumannii NOT DETECTED NOT DETECTED Final   Bacteroides fragilis NOT DETECTED NOT DETECTED Final   Enterobacterales DETECTED (A) NOT DETECTED Final    Comment: Enterobacterales represent a large order of gram negative bacteria, not a single organism. [place]    Enterobacter cloacae complex NOT DETECTED NOT DETECTED Final   Escherichia coli DETECTED (A) NOT DETECTED Final    Comment: [place] FILM ARRAY   Klebsiella aerogenes NOT DETECTED NOT DETECTED Final   Klebsiella oxytoca NOT DETECTED NOT DETECTED Final   Klebsiella pneumoniae NOT DETECTED NOT DETECTED Final   Proteus species NOT DETECTED NOT DETECTED Final   Salmonella species NOT DETECTED NOT DETECTED Final   Serratia marcescens NOT DETECTED NOT DETECTED Final   Haemophilus influenzae NOT DETECTED NOT DETECTED Final   Neisseria meningitidis NOT DETECTED NOT DETECTED Final   Pseudomonas aeruginosa NOT DETECTED NOT DETECTED Final   Stenotrophomonas maltophilia NOT DETECTED NOT DETECTED Final   Candida albicans NOT DETECTED NOT DETECTED Final   Candida auris NOT DETECTED NOT DETECTED Final   Candida glabrata NOT DETECTED NOT DETECTED Final   Candida krusei NOT DETECTED NOT DETECTED Final   Candida parapsilosis NOT DETECTED NOT DETECTED Final   Candida tropicalis NOT DETECTED NOT DETECTED Final   Cryptococcus neoformans/gattii NOT DETECTED NOT DETECTED Final   CTX-M ESBL NOT  DETECTED NOT DETECTED Final   Carbapenem resistance IMP NOT DETECTED NOT DETECTED Final   Carbapenem resistance KPC NOT DETECTED NOT DETECTED Final   Carbapenem resistance NDM NOT DETECTED NOT DETECTED Final   Carbapenem resist OXA 48 LIKE NOT DETECTED NOT DETECTED Final   Carbapenem resistance VIM NOT DETECTED NOT DETECTED Final    Comment: Performed at [facility], [address], [place], [place] [address]  Urine culture     Status: Abnormal   Collection Time: 09/08/20  4:16 AM   Specimen: In/Out Cath Urine  Result Value Ref Range Status   Specimen Description IN/OUT CATH URINE  Final   Special Requests   Final    NONE Performed at [facility], [place] [address] [place], [place] [address]    Culture >=100,000 COLONIES/mL ESCHERICHIA COLI (A)  Final   Report Status 09/10/2020 FINAL  Final   Organism ID, Bacteria ESCHERICHIA COLI (A)  Final      Susceptibility   Escherichia coli - MIC*    AMPICILLIN >=32 RESISTANT Resistant     CEFAZOLIN <=4 SENSITIVE Sensitive     CEFEPIME <=0.12 SENSITIVE Sensitive     CEFTRIAXONE <=0.25 SENSITIVE Sensitive     CIPROFLOXACIN <=0.25 SENSITIVE Sensitive     GENTAMICIN <=1 SENSITIVE Sensitive     IMIPENEM <=0.25 SENSITIVE Sensitive     NITROFURANTOIN <=16 SENSITIVE Sensitive     TRIMETH/SULFA <=20 SENSITIVE Sensitive     AMPICILLIN/SULBACTAM >=32 RESISTANT Resistant     PIP/TAZO <=4 SENSITIVE Sensitive     * >=  100,000 COLONIES/mL ESCHERICHIA COLI  Resp Panel by RT-PCR (Flu A&B, Covid) Nasopharyngeal Swab     Status: None   Collection Time: 09/08/20  4:16 AM   Specimen: Nasopharyngeal Swab; Nasopharyngeal(NP) swabs in vial transport medium  Result Value Ref Range Status   SARS Coronavirus 2 by RT PCR NEGATIVE NEGATIVE Final    Comment: (NOTE) SARS-CoV-2 target nucleic acids are NOT DETECTED.  The SARS-CoV-2 RNA is generally detectable in upper respiratory specimens during the acute phase of infection. The lowest concentration of  SARS-CoV-2 viral copies this assay can detect is 138 copies/mL. A negative result does not preclude SARS-Cov-2 infection and should not be used as the sole basis for treatment or other patient management decisions. A negative result may occur with  improper specimen collection/handling, submission of specimen other than nasopharyngeal swab, presence of viral mutation(s) within the areas targeted by this assay, and inadequate number of viral copies(<138 copies/mL). A negative result must be combined with clinical observations, patient history, and epidemiological information. The expected result is Negative.  Fact Sheet for Patients:  EntrepreneurPulse.com.au  Fact Sheet for Healthcare Providers:  IncredibleEmployment.be  This test is no t yet approved or cleared by the [place] FDA and  has been authorized for detection and/or diagnosis of SARS-CoV-2 by FDA under an Emergency Use Authorization (EUA). This EUA will remain  in effect (meaning this test can be used) for the duration of the COVID-19 declaration under Section 564(b)(1) of the Act, 21 U.S.C.section 360bbb-3(b)(1), unless the authorization is terminated  or revoked sooner.       Influenza A by PCR NEGATIVE NEGATIVE Final   Influenza B by PCR NEGATIVE NEGATIVE Final    Comment: (NOTE) The Xpert Xpress SARS-CoV-2/FLU/RSV plus assay is intended as an aid in the diagnosis of influenza from Nasopharyngeal swab specimens and should not be used as a sole basis for treatment. Nasal washings and aspirates are unacceptable for Xpert Xpress SARS-CoV-2/FLU/RSV testing.  Fact Sheet for Patients: EntrepreneurPulse.com.au  Fact Sheet for Healthcare Providers: IncredibleEmployment.be  This test is not yet approved or cleared by the [place] FDA and has been authorized for detection and/or diagnosis of SARS-CoV-2 by FDA under an Emergency Use  Authorization (EUA). This EUA will remain in effect (meaning this test can be used) for the duration of the COVID-19 declaration under Section 564(b)(1) of the Act, 21 U.S.C. section 360bbb-3(b)(1), unless the authorization is terminated or revoked.  Performed at [facility], [place] [address] [place], [place] [address]   Culture, blood (single)     Status: None (Preliminary result)   Collection Time: 09/08/20  4:37 PM   Specimen: BLOOD RIGHT HAND  Result Value Ref Range Status   Specimen Description BLOOD RIGHT HAND  Final   Special Requests   Final    BOTTLES DRAWN AEROBIC AND ANAEROBIC Blood Culture adequate volume   Culture   Final    NO GROWTH 3 DAYS Performed at [facility], [place] [address] [place], [place] [address]    Report Status PENDING  Incomplete  Culture, blood (Routine X 2) w Reflex to ID Panel     Status: None (Preliminary result)   Collection Time: 09/10/20  7:51 PM   Specimen: BLOOD LEFT HAND  Result Value Ref Range Status   Specimen Description BLOOD LEFT HAND  Final   Special Requests   Final    BOTTLES DRAWN AEROBIC ONLY Blood Culture results may not be optimal due to an inadequate volume of  blood received in culture bottles   Culture   Final    NO GROWTH < 12 HOURS Performed at [facility], [place] [address], [place], [place] [address]    Report Status PENDING  Incomplete  Culture, blood (Routine X 2) w Reflex to ID Panel     Status: None (Preliminary result)   Collection Time: 09/10/20  8:00 PM   Specimen: BLOOD RIGHT HAND  Result Value Ref Range Status   Specimen Description BLOOD RIGHT HAND  Final   Special Requests   Final    BOTTLES DRAWN AEROBIC ONLY Blood Culture results may not be optimal due to an inadequate volume of blood received in culture bottles   Culture   Final    NO GROWTH < 12 HOURS Performed at [facility], [place] [address] [place], [place] [address]    Report Status PENDING  Incomplete          Radiology Studies: CT ABDOMEN PELVIS WO CONTRAST  Result Date: 09/10/2020 CLINICAL DATA:  Abdominal abscess. EXAM: CT ABDOMEN AND PELVIS WITHOUT CONTRAST TECHNIQUE: Multidetector CT imaging of the abdomen and pelvis was performed following the standard protocol without IV contrast. COMPARISON:  Sep 11, 2019. FINDINGS: Lower chest: No acute abnormality. Hepatobiliary: No focal liver abnormality is seen. Status post cholecystectomy. No biliary dilatation. Pancreas: Fatty replacement of the pancreas is noted. Spleen: Normal in size without focal abnormality. Adrenals/Urinary Tract: Adrenal glands appear normal. Left renal cyst is noted. Small nonobstructive right renal cyst is noted. Mild right hydroureteronephrosis is noted without obstructing calculus. Urinary bladder is unremarkable. Left renal cyst is noted. Stomach/Bowel: Stomach is within normal limits. Appendix appears normal. No evidence of bowel wall thickening, distention, or inflammatory changes. Sigmoid diverticulosis is noted without inflammation. Vascular/Lymphatic: Aortic atherosclerosis. No enlarged abdominal or pelvic lymph nodes. Reproductive: Uterus and bilateral adnexa are unremarkable. Other: No abdominal wall hernia or abnormality. No abdominopelvic ascites. Musculoskeletal: No acute or significant osseous findings. IMPRESSION: Small nonobstructive right renal cyst. Mild right hydroureteronephrosis is noted without obstructing calculus. Sigmoid diverticulosis without inflammation. Aortic Atherosclerosis (ICD10-I70.0). Electronically Signed   By: Marijo Conception M.D.   On: 09/10/2020 16:15        Scheduled Meds: . apixaban  5 mg Oral BID  . escitalopram  20 mg Oral Daily  . insulin aspart  0-15 Units Subcutaneous TID WC  . insulin NPH Human  25 Units Subcutaneous QAC breakfast  . levothyroxine  137 mcg Oral QAC breakfast  . sodium chloride flush  3 mL Intravenous Q12H   Continuous Infusions: . sodium chloride 10 mL/hr  at 09/11/20 0700  .  ceFAZolin (ANCEF) IV 2 g (09/11/20 0702)  . lactated ringers Stopped (09/11/20 1200)     LOS: 3 days        Hosie Poisson, MD [facility]   To contact the attending provider between 7A-7P or the covering provider during after hours 7P-7A, please log into the web site [email or web address] and access using universal [place] password for that web site. If you do not have the password, please call the hospital operator.  09/11/2020, 2:30 PM

## 2020-09-11 NOTE — Consult Note (Signed)
[place] for Infectious Disease       Reason for Consult: fever    Referring Physician: Dr. Karleen Hampshire  Principal Problem:   Sepsis secondary to UTI [facility]) Active Problems:   Hypothyroidism   Obesity (BMI 30.0-34.9)   Thalassemia minor   S/P TAVR (transcatheter aortic valve replacement)   Paroxysmal atrial fibrillation (HCC)   OSA (obstructive sleep apnea)   Elevated ALT measurement   History of CVA with residual deficit   Hyperbilirubinemia   . apixaban  5 mg Oral BID  . escitalopram  20 mg Oral Daily  . insulin aspart  0-15 Units Subcutaneous TID WC  . insulin NPH Human  25 Units Subcutaneous QAC breakfast  . levothyroxine  137 mcg Oral QAC breakfast  . sodium chloride flush  3 mL Intravenous Q12H    Recommendations:  keflex 500 mg 4 times a day at discharge through 09/16/20  Assessment: She has dysuria, pyuria, fever, leukocytosis, positive urine and blood cultures with E coli c/w pyelonephritis.  Sensitivities noted and on cefazolin.  I recommend 10 days of total treatment and anticipate ongoing fever possible intermittently over the next 2-3 days.   Leukopenia - initially WBC elevated but a bit lower now. Recheck tomorrow and if it remains low, should be rechecked in about 1 week.   Ok from [place] standpoint for discharge.  Please call with any questions.  thanks  Antibiotics: Ceftriaxone > cefazolin  HPI: Breanna Webster is a 75 y.o. female with HTN, hyperlipidemia, CVA, paraxysmal afib on Eliquis, history of TAVR for severe AS who came in on 5/19 with fever, urinary urgency and discomfort with incontinence.  She was admitted and started on ceftriaxone and blood and urine cultures positive for E coli.  Tmax here 100.8 and has had a persistent fever, though not over 101.  She feels better since admission. Asking why her antibiotics were changed.    Review of Systems:  Constitutional: positive for fevers and chills Gastrointestinal: negative for  diarrhea Integument/breast: negative for rash All other systems reviewed and are negative    Past Medical History:  Diagnosis Date  . Anxiety   . Arthritis    "back" (04/22/2018)  . Back pain   . Blood transfusion without reported diagnosis   . CAD (coronary artery disease)    a. 03/2018 s/p PCI/DES to the RCA (3.0x15 Onyx DES).  . Carotid artery stenosis    Mild  . Chest pain   . Chronic lower back pain   . Cirrhosis ([place])   . Colon polyps   . Diverticulitis   . Diverticulosis   . Esophageal thickening    seen on pre TAVR CT scan, also questionable cirrhosis. MRI recommended. Will refer to GI  . Fatty liver   . GERD (gastroesophageal reflux disease)   . Grave's disease   . History of colonic polyps 05/22/2017  . History of hiatal hernia   . Hypertension   . Hypothyroidism   . IBS (irritable bowel syndrome)   . Osteopenia   . Pulmonary nodules    seen on pre TAVR CT. likley benign. no follow up recommended if pt low risk.  . S/P TAVR (transcatheter aortic valve replacement)   . Severe aortic stenosis   . Shortness of breath on exertion   . Stroke ([place])   . Thalassemia minor   . Thyroid disease   . Type II diabetes mellitus (HCC)     Social History   Tobacco Use  . Smoking  status: Never Smoker  . Smokeless tobacco: Never Used  Vaping Use  . Vaping Use: Never used  Substance Use Topics  . Alcohol use: No    Alcohol/week: 0.0 standard drinks  . Drug use: Never    Family History  Adopted: Yes  Problem Relation Age of Onset  . Healthy Son        x 2  . Headache Other        Cluster headaches  . Heart failure Mother   . Colon cancer Neg Hx   . Pancreatic cancer Neg Hx   . Rectal cancer Neg Hx   . Stomach cancer Neg Hx     Allergies  Allergen Reactions  . Statins Other (See Comments)    Muscle aches    Physical Exam: Constitutional: in no apparent distress  Vitals:   09/11/20 0500 09/11/20 0915  BP: 132/65 128/66  Pulse: 68 61  Resp:    Temp:  99.1 F (37.3 C) 100.2 F (37.9 C)  SpO2: 95% 90%   EYES: anicteric ENMT: no thrush; + small area of erythema in right nostril Cardiovascular: Cor RRR Respiratory: clear; Musculoskeletal: no pedal edema noted Skin: negatives: no rash  Lab Results  Component Value Date   WBC 3.9 (L) 09/11/2020   HGB 9.3 (L) 09/11/2020   HCT 29.1 (L) 09/11/2020   MCV 63.8 (L) 09/11/2020   PLT 95 (L) 09/11/2020    Lab Results  Component Value Date   CREATININE 0.74 09/11/2020   BUN 16 09/11/2020   NA 137 09/11/2020   K 3.6 09/11/2020   CL 106 09/11/2020   CO2 23 09/11/2020    Lab Results  Component Value Date   ALT 48 (H) 09/09/2020   AST 28 09/09/2020   ALKPHOS 57 09/09/2020     Microbiology: Recent Results (from the past 240 hour(s))  Urine Culture     Status: Abnormal   Collection Time: 09/07/20  3:15 PM   Specimen: Urine  Result Value Ref Range Status   MICRO NUMBER: [id number]  Final   SPECIMEN QUALITY: Adequate  Final   Sample Source NOT GIVEN  Final   STATUS: FINAL  Final   ISOLATE 1: Escherichia coli (A)  Final    Comment: Greater than 100,000 CFU/mL of Escherichia coli      Susceptibility   Escherichia coli - URINE CULTURE, REFLEX    AMOX/CLAVULANIC 4 Sensitive     AMPICILLIN >=32 Resistant     AMPICILLIN/SULBACTAM >=32 Resistant     CEFAZOLIN* <=4 Not Reportable      * For infections other than uncomplicated UTIcaused by E. coli, K. pneumoniae or P. mirabilis:Cefazolin is resistant if MIC > or = 8 mcg/mL.(Distinguishing susceptible versus intermediatefor isolates with MIC < or = 4 mcg/mL requiresadditional testing.)For uncomplicated UTI caused by E. coli,K. pneumoniae or P. mirabilis: Cefazolin issusceptible if MIC <32 mcg/mL and predictssusceptible to the oral agents cefaclor, cefdinir,cefpodoxime, cefprozil, cefuroxime, cephalexinand loracarbef.    CEFEPIME <=1 Sensitive     CEFTRIAXONE <=1 Sensitive     CIPROFLOXACIN <=0.25 Sensitive     LEVOFLOXACIN <=0.12 Sensitive      ERTAPENEM <=0.5 Sensitive     GENTAMICIN <=1 Sensitive     IMIPENEM <=0.25 Sensitive     NITROFURANTOIN <=16 Sensitive     PIP/TAZO <=4 Sensitive     TOBRAMYCIN <=1 Sensitive     TRIMETH/SULFA* <=20 Sensitive      * For infections other than uncomplicated UTIcaused by E. coli, K. pneumoniae or  P. mirabilis:Cefazolin is resistant if MIC > or = 8 mcg/mL.(Distinguishing susceptible versus intermediatefor isolates with MIC < or = 4 mcg/mL requiresadditional testing.)For uncomplicated UTI caused by E. coli,K. pneumoniae or P. mirabilis: Cefazolin issusceptible if MIC <32 mcg/mL and predictssusceptible to the oral agents cefaclor, cefdinir,cefpodoxime, cefprozil, cefuroxime, cephalexinand loracarbef.Legend:S = Susceptible  I = IntermediateR = Resistant  NS = Not susceptible* = Not tested  NR = Not reported**NN = See antimicrobic comments  Blood culture (routine single)     Status: Abnormal   Collection Time: 09/08/20  4:10 AM   Specimen: BLOOD  Result Value Ref Range Status   Specimen Description BLOOD RIGHT ANTECUBITAL  Final   Special Requests   Final    BOTTLES DRAWN AEROBIC AND ANAEROBIC Blood Culture adequate volume   Culture  Setup Time   Final    GRAM NEGATIVE RODS AEROBIC BOTTLE ONLY CRITICAL RESULT CALLED TO, READ BACK BY AND VERIFIED WITH: PHARMD GREG A. [phone] FCP Performed at [facility], [place] [address], [place], [place] [address]    Culture ESCHERICHIA COLI (A)  Final   Report Status 09/10/2020 FINAL  Final   Organism ID, Bacteria ESCHERICHIA COLI  Final      Susceptibility   Escherichia coli - MIC*    AMPICILLIN >=32 RESISTANT Resistant     CEFAZOLIN <=4 SENSITIVE Sensitive     CEFEPIME <=0.12 SENSITIVE Sensitive     CEFTAZIDIME <=1 SENSITIVE Sensitive     CEFTRIAXONE <=0.25 SENSITIVE Sensitive     CIPROFLOXACIN <=0.25 SENSITIVE Sensitive     GENTAMICIN <=1 SENSITIVE Sensitive     IMIPENEM <=0.25 SENSITIVE Sensitive     TRIMETH/SULFA <=20 SENSITIVE Sensitive      AMPICILLIN/SULBACTAM >=32 RESISTANT Resistant     PIP/TAZO <=4 SENSITIVE Sensitive     * ESCHERICHIA COLI  Blood Culture ID Panel (Reflexed)     Status: Abnormal   Collection Time: 09/08/20  4:10 AM  Result Value Ref Range Status   Enterococcus faecalis NOT DETECTED NOT DETECTED Final   Enterococcus Faecium NOT DETECTED NOT DETECTED Final   Listeria monocytogenes NOT DETECTED NOT DETECTED Final   Staphylococcus species NOT DETECTED NOT DETECTED Final   Staphylococcus aureus (BCID) NOT DETECTED NOT DETECTED Final   Staphylococcus epidermidis NOT DETECTED NOT DETECTED Final   Staphylococcus lugdunensis NOT DETECTED NOT DETECTED Final   Streptococcus species NOT DETECTED NOT DETECTED Final   Streptococcus agalactiae NOT DETECTED NOT DETECTED Final   Streptococcus pneumoniae NOT DETECTED NOT DETECTED Final   Streptococcus pyogenes NOT DETECTED NOT DETECTED Final   A.calcoaceticus-baumannii NOT DETECTED NOT DETECTED Final   Bacteroides fragilis NOT DETECTED NOT DETECTED Final   Enterobacterales DETECTED (A) NOT DETECTED Final    Comment: Enterobacterales represent a large order of gram negative bacteria, not a single organism. [place]    Enterobacter cloacae complex NOT DETECTED NOT DETECTED Final   Escherichia coli DETECTED (A) NOT DETECTED Final    Comment: [facility] BIOFIRE FILM ARRAY   Klebsiella aerogenes NOT DETECTED NOT DETECTED Final   Klebsiella oxytoca NOT DETECTED NOT DETECTED Final   Klebsiella pneumoniae NOT DETECTED NOT DETECTED Final   Proteus species NOT DETECTED NOT DETECTED Final   Salmonella species NOT DETECTED NOT DETECTED Final   Serratia marcescens NOT DETECTED NOT DETECTED Final   Haemophilus influenzae NOT DETECTED NOT DETECTED Final   Neisseria meningitidis NOT DETECTED NOT DETECTED Final   Pseudomonas aeruginosa NOT DETECTED NOT DETECTED Final   Stenotrophomonas maltophilia NOT  DETECTED NOT DETECTED Final   Candida albicans NOT DETECTED NOT  DETECTED Final   Candida auris NOT DETECTED NOT DETECTED Final   Candida glabrata NOT DETECTED NOT DETECTED Final   Candida krusei NOT DETECTED NOT DETECTED Final   Candida parapsilosis NOT DETECTED NOT DETECTED Final   Candida tropicalis NOT DETECTED NOT DETECTED Final   Cryptococcus neoformans/gattii NOT DETECTED NOT DETECTED Final   CTX-M ESBL NOT DETECTED NOT DETECTED Final   Carbapenem resistance IMP NOT DETECTED NOT DETECTED Final   Carbapenem resistance KPC NOT DETECTED NOT DETECTED Final   Carbapenem resistance NDM NOT DETECTED NOT DETECTED Final   Carbapenem resist OXA 48 LIKE NOT DETECTED NOT DETECTED Final   Carbapenem resistance VIM NOT DETECTED NOT DETECTED Final    Comment: Performed at [facility], [address], [place], [place] [address]  Urine culture     Status: Abnormal   Collection Time: 09/08/20  4:16 AM   Specimen: In/Out Cath Urine  Result Value Ref Range Status   Specimen Description IN/OUT CATH URINE  Final   Special Requests   Final    NONE Performed at [facility], [place] [address] [place], [place] [address]    Culture >=100,000 COLONIES/mL ESCHERICHIA COLI (A)  Final   Report Status 09/10/2020 FINAL  Final   Organism ID, Bacteria ESCHERICHIA COLI (A)  Final      Susceptibility   Escherichia coli - MIC*    AMPICILLIN >=32 RESISTANT Resistant     CEFAZOLIN <=4 SENSITIVE Sensitive     CEFEPIME <=0.12 SENSITIVE Sensitive     CEFTRIAXONE <=0.25 SENSITIVE Sensitive     CIPROFLOXACIN <=0.25 SENSITIVE Sensitive     GENTAMICIN <=1 SENSITIVE Sensitive     IMIPENEM <=0.25 SENSITIVE Sensitive     NITROFURANTOIN <=16 SENSITIVE Sensitive     TRIMETH/SULFA <=20 SENSITIVE Sensitive     AMPICILLIN/SULBACTAM >=32 RESISTANT Resistant     PIP/TAZO <=4 SENSITIVE Sensitive     * >=100,000 COLONIES/mL ESCHERICHIA COLI  Resp Panel by RT-PCR (Flu A&B, Covid) Nasopharyngeal Swab     Status: None   Collection Time: 09/08/20  4:16 AM   Specimen: Nasopharyngeal  Swab; Nasopharyngeal(NP) swabs in vial transport medium  Result Value Ref Range Status   SARS Coronavirus 2 by RT PCR NEGATIVE NEGATIVE Final    Comment: (NOTE) SARS-CoV-2 target nucleic acids are NOT DETECTED.  The SARS-CoV-2 RNA is generally detectable in upper respiratory specimens during the acute phase of infection. The lowest concentration of SARS-CoV-2 viral copies this assay can detect is 138 copies/mL. A negative result does not preclude SARS-Cov-2 infection and should not be used as the sole basis for treatment or other patient management decisions. A negative result may occur with  improper specimen collection/handling, submission of specimen other than nasopharyngeal swab, presence of viral mutation(s) within the areas targeted by this assay, and inadequate number of viral copies(<138 copies/mL). A negative result must be combined with clinical observations, patient history, and epidemiological information. The expected result is Negative.  Fact Sheet for Patients:  EntrepreneurPulse.com.au  Fact Sheet for Healthcare Providers:  IncredibleEmployment.be  This test is no t yet approved or cleared by the [place] FDA and  has been authorized for detection and/or diagnosis of SARS-CoV-2 by FDA under an Emergency Use Authorization (EUA). This EUA will remain  in effect (meaning this test can be used) for the duration of the COVID-19 declaration under Section 564(b)(1) of the Act, 21 U.S.C.section 360bbb-3(b)(1), unless the authorization  is terminated  or revoked sooner.       Influenza A by PCR NEGATIVE NEGATIVE Final   Influenza B by PCR NEGATIVE NEGATIVE Final    Comment: (NOTE) The Xpert Xpress SARS-CoV-2/FLU/RSV plus assay is intended as an aid in the diagnosis of influenza from Nasopharyngeal swab specimens and should not be used as a sole basis for treatment. Nasal washings and aspirates are unacceptable for Xpert Xpress  SARS-CoV-2/FLU/RSV testing.  Fact Sheet for Patients: EntrepreneurPulse.com.au  Fact Sheet for Healthcare Providers: IncredibleEmployment.be  This test is not yet approved or cleared by the [place] FDA and has been authorized for detection and/or diagnosis of SARS-CoV-2 by FDA under an Emergency Use Authorization (EUA). This EUA will remain in effect (meaning this test can be used) for the duration of the COVID-19 declaration under Section 564(b)(1) of the Act, 21 U.S.C. section 360bbb-3(b)(1), unless the authorization is terminated or revoked.  Performed at [facility], [place] [address] [place], [place] [address]   Culture, blood (single)     Status: None (Preliminary result)   Collection Time: 09/08/20  4:37 PM   Specimen: BLOOD RIGHT HAND  Result Value Ref Range Status   Specimen Description BLOOD RIGHT HAND  Final   Special Requests   Final    BOTTLES DRAWN AEROBIC AND ANAEROBIC Blood Culture adequate volume   Culture   Final    NO GROWTH 3 DAYS Performed at [facility], [place] [address], [place], [place] [address]    Report Status PENDING  Incomplete  Culture, blood (Routine X 2) w Reflex to ID Panel     Status: None (Preliminary result)   Collection Time: 09/10/20  7:51 PM   Specimen: BLOOD LEFT HAND  Result Value Ref Range Status   Specimen Description BLOOD LEFT HAND  Final   Special Requests   Final    BOTTLES DRAWN AEROBIC ONLY Blood Culture results may not be optimal due to an inadequate volume of blood received in culture bottles   Culture   Final    NO GROWTH < 12 HOURS Performed at [facility], [place] [address], [place], [place] [address]    Report Status PENDING  Incomplete  Culture, blood (Routine X 2) w Reflex to ID Panel     Status: None (Preliminary result)   Collection Time: 09/10/20  8:00 PM   Specimen: BLOOD RIGHT HAND  Result Value Ref Range Status   Specimen Description BLOOD RIGHT HAND   Final   Special Requests   Final    BOTTLES DRAWN AEROBIC ONLY Blood Culture results may not be optimal due to an inadequate volume of blood received in culture bottles   Culture   Final    NO GROWTH < 12 HOURS Performed at [facility], [place] [address] [place], [place] [address]    Report Status PENDING  Incomplete    Thayer Headings, [place] for Infectious Disease [place] [email or web address] 09/11/2020, 1:49 PM

## 2020-09-12 DIAGNOSIS — I6930 Unspecified sequelae of cerebral infarction: Secondary | ICD-10-CM | POA: Diagnosis not present

## 2020-09-12 DIAGNOSIS — N390 Urinary tract infection, site not specified: Secondary | ICD-10-CM | POA: Diagnosis not present

## 2020-09-12 DIAGNOSIS — A419 Sepsis, unspecified organism: Secondary | ICD-10-CM | POA: Diagnosis not present

## 2020-09-12 DIAGNOSIS — E038 Other specified hypothyroidism: Secondary | ICD-10-CM | POA: Diagnosis not present

## 2020-09-12 LAB — BLOOD CULTURE ID PANEL (REFLEXED) - BCID2

## 2020-09-12 LAB — CBC
HCT: 26.5 % — ABNORMAL LOW (ref 36.0–46.0)
Hemoglobin: 8.2 g/dL — ABNORMAL LOW (ref 12.0–15.0)
MCH: 19.9 pg — ABNORMAL LOW (ref 26.0–34.0)
MCHC: 30.9 g/dL (ref 30.0–36.0)
MCV: 64.2 fL — ABNORMAL LOW (ref 80.0–100.0)
Platelets: 109 10*3/uL — ABNORMAL LOW (ref 150–400)
RBC: 4.13 MIL/uL (ref 3.87–5.11)
RDW: 14.6 % (ref 11.5–15.5)
WBC: 4.6 10*3/uL (ref 4.0–10.5)
nRBC: 0 % (ref 0.0–0.2)

## 2020-09-12 LAB — GLUCOSE, CAPILLARY: Glucose-Capillary: 94 mg/dL (ref 70–99)

## 2020-09-12 MED ORDER — CIPROFLOXACIN HCL 500 MG PO TABS: 500.0000 mg | ORAL_TABLET | Freq: Two times a day (BID) | ORAL | 0 refills | Status: AC

## 2020-09-12 NOTE — Progress Notes (Signed)
Physical Therapy Treatment Patient Details Name: Breanna Webster MRN: [id number] DOB: 1945/04/26 Today's Date: 09/12/2020    History of Present Illness Pt is a 75 y/o female admitted secondary to sepsis from UTI. PMH includes CVA with L sided weakness/tremors, a fib, s/p TAVR, DM, and NASH cirrhosis.    PT Comments    Patient received in bed, she is agreeable to PT session. Patient requires min assist for supine to sit. Min guard for sit to stand and ambulation with RW 200 feet. Patient is fatigued with ambulation. Required 1-2 brief standing rest breaks during gait. She will continue to benefit from skilled PT while here to improve functional independence and activity tolerance for safe return home.     Follow Up Recommendations  No PT follow up     Equipment Recommendations  None recommended by PT    Recommendations for Other Services       Precautions / Restrictions Precautions Precautions: Fall Restrictions Weight Bearing Restrictions: No    Mobility  Bed Mobility Overal bed mobility: Needs Assistance Bed Mobility: Supine to Sit;Sit to Supine     Supine to sit: Min assist Sit to supine: Modified independent (Device/Increase time)   General bed mobility comments: Min A for trunk elevation to come to sitting    Transfers Overall transfer level: Needs assistance Equipment used: Rolling walker (2 wheeled) Transfers: Sit to/from Stand Sit to Stand: Min guard         General transfer comment: Min guard for safety  Ambulation/Gait Ambulation/Gait assistance: Min guard Gait Distance (Feet): 200 Feet Assistive device: Rolling walker (2 wheeled) Gait Pattern/deviations: Step-through pattern;Decreased step length - right;Decreased step length - left;Trunk flexed Gait velocity: Decreased   General Gait Details: Min guard for ambulation 1-2 short standing rest breaks due to fatigue   Stairs             Wheelchair Mobility    Modified Rankin (Stroke  Patients Only)       Balance Overall balance assessment: Modified Independent Sitting-balance support: Feet supported Sitting balance-Leahy Scale: Good     Standing balance support: Bilateral upper extremity supported;During functional activity;Single extremity supported Standing balance-Leahy Scale: Fair Standing balance comment: Able to maintain static standing with UE support, reliant on B Ue support for longer distances                            Cognition Arousal/Alertness: Awake/alert Behavior During Therapy: WFL for tasks assessed/performed Overall Cognitive Status: Within Functional Limits for tasks assessed                                        Exercises      General Comments        Pertinent Vitals/Pain Pain Assessment: No/denies pain    Home Living                      Prior Function            PT Goals (current goals can now be found in the care plan section) Acute Rehab PT Goals Patient Stated Goal: to return home PT Goal Formulation: With patient Time For Goal Achievement: 09/23/20 Potential to Achieve Goals: Good Progress towards PT goals: Progressing toward goals    Frequency    Min 3X/week      PT Plan Current plan  remains appropriate    Co-evaluation              AM-PAC PT "6 Clicks" Mobility   Outcome Measure  Help needed turning from your back to your side while in a flat bed without using bedrails?: A Little Help needed moving from lying on your back to sitting on the side of a flat bed without using bedrails?: A Little Help needed moving to and from a bed to a chair (including a wheelchair)?: A Little Help needed standing up from a chair using your arms (e.g., wheelchair or bedside chair)?: A Little Help needed to walk in hospital room?: A Little Help needed climbing 3-5 steps with a railing? : A Little 6 Click Score: 18    End of Session Equipment Utilized During Treatment: Gait  belt Activity Tolerance: Patient tolerated treatment well;Patient limited by fatigue Patient left: in bed;with call bell/phone within reach Nurse Communication: Mobility status PT Visit Diagnosis: Other abnormalities of gait and mobility (R26.89);Muscle weakness (generalized) (M62.81)     Time: 7262-0355 PT Time Calculation (min) (ACUTE ONLY): 27 min  Charges:  $Gait Training: 8-22 mins $Therapeutic Activity: 8-22 mins                     [employer], PT, [facility] 09/12/20,10:21 AM

## 2020-09-12 NOTE — Discharge Summary (Signed)
Physician Discharge Summary  Breanna Webster JME:[id number] DOB: Nov 08, 1945 DOA: 09/08/2020  PCP: [name], NP  Admit date: 09/08/2020 Discharge date: 09/12/2020  Admitted From: Home Disposition:  Home  Recommendations for Outpatient Follow-up:  1. Follow up with PCP in 1-2 weeks 2. Please obtain BMP/CBC in one week   Discharge Condition:Stable CODE STATUS: full code Diet recommendation: Heart Healthy   Brief/Interim Summary: Breanna Peplinski Auslanderis a 75 y.o.femalewith medical history significant ofhypertension, hyperlipidemia,CVA with residual left-sided weakness and tremor, PAF on Eliquis, severe ASs/pTAVR3/2020,CAD, mitral valve endocarditis 09/2018, PAF, diabetes mellitus type 2, thalassemia minor,NASHcirrhosis, hypothyroidism, andGERDpresents with complaints of discomfort with urination and fever.  She was admitted for sepsis from UTI.   ID consulted for further evaluation.  Discharge Diagnoses:  Principal Problem:   Sepsis secondary to UTI [facility]) Active Problems:   Hypothyroidism   Obesity (BMI 30.0-34.9)   Thalassemia minor   S/P TAVR (transcatheter aortic valve replacement)   Paroxysmal atrial fibrillation (HCC)   OSA (obstructive sleep apnea)   Elevated ALT measurement   History of CVA with residual deficit   Hyperbilirubinemia   Sepsis from E coli bacteremia and E coli UTI present on admission;  Improving sepsis physiology.  Started her on IV rocephin 2 gm daily. Sensitivities are reviewed, transitioned to ancef. Plan for 10 day antibiotic treatment. Will need outpatient follow up with Urology.  Continue with IV fluids. Lactic acid and wbc count normalized.  Persistently febrile , repeated blood cultures, have been negative so far.  ID consulted for recommendations.  No further work up needed at this time.     H/o PAF:  Rate controlled.  Resume eliquis .     Insulin dependent DM uncontrolled with hyperglycemia.   Resume Novolon N 25  units daily. And SSI.  Hemoglobin A1c is 7.4 No changes in meds.    Microcytic anemia/ thalassemia Minor - stool for occult blood is negative.  - anemia panel shows adequate iron and ferritin is 351. - baseline hemoglobin ranging from 9 to 11.  - no bleeding.    Mild thrombocytopenia:  No signs of bleeding.   H/o of CVA with residual left sided weakness and tremor:  - continue with eliquis   Hyperbilirubinemia:  Resolved.    Mild hyponatremia:  Resolved with hydration.  Continue to monitor.    H/o TAVR in 06/2018 MV endocarditis in 09/2018 CAD Last echocardiogram on 06/2019 showed  LVEF of 60 to 65%, with grade 2 diastolic dysfunction.     Hypertension;  Bp parameters are optimal.      Discharge Instructions  Discharge Instructions    Diet - low sodium heart healthy   Complete by: As directed    Discharge instructions   Complete by: As directed    Please follow up with PCP in one week before the antibiotics are completed.     Allergies as of 09/12/2020      Reactions   Statins Other (See Comments)   Muscle aches      Medication List    STOP taking these medications   losartan 50 MG tablet Commonly known as: COZAAR     TAKE these medications   BD Insulin Syringe U/F 31G X 5/16" 1 ML Misc Generic drug: Insulin Syringe-Needle U-100 FOUR TIMES A DAY UNDER THE SKIN   cholecalciferol 25 MCG (1000 UNIT) tablet Commonly known as: VITAMIN D Take 1,000 Units by mouth daily.   ciprofloxacin 500 MG tablet Commonly known as: Cipro Take 1 tablet (500 mg  total) by mouth 2 (two) times daily for 6 days.   diltiazem 120 MG 24 hr capsule Commonly known as: CARDIZEM CD TAKE ONE CAPSULE BY MOUTH DAILY   Eliquis 5 MG Tabs tablet Generic drug: apixaban TAKE ONE TABLET BY MOUTH TWICE A DAY What changed: how much to take   escitalopram 20 MG tablet Commonly known as: LEXAPRO Take 20 mg by mouth daily.   metFORMIN 500 MG 24 hr  tablet Commonly known as: GLUCOPHAGE-XR Take 500 mg by mouth 2 (two) times daily.   multivitamin with minerals Tabs tablet Take 1 tablet by mouth daily.   nitroGLYCERIN 0.4 MG SL tablet Commonly known as: NITROSTAT Place 1 tablet (0.4 mg total) under the tongue every 5 (five) minutes as needed for chest pain.   NovoLIN N 100 UNIT/ML injection Generic drug: insulin NPH Human Inject 35 Units into the skin daily before breakfast.   NovoLIN R 100 units/mL injection Generic drug: insulin regular 10 UNITS THIRTY MINUTES BEFORE MEALS.  5 ADDITIONAL UNITS WITH CARBS OR SNACKS.   OneTouch Delica Plus INOMVE72C Misc USE TO CHECK BLOOD SUGAR TWICE DAILY.[name] Verio test strip Generic drug: glucose blood USE TO MONITOR GLUCOSE LEVELS TWICE DAILY   Synthroid 137 MCG tablet Generic drug: levothyroxine Take 137 mcg by mouth daily.       Allergies  Allergen Reactions  . Statins Other (See Comments)    Muscle aches    Consultations:  ID   Procedures/Studies: CT ABDOMEN PELVIS WO CONTRAST  Result Date: 09/10/2020 CLINICAL DATA:  Abdominal abscess. EXAM: CT ABDOMEN AND PELVIS WITHOUT CONTRAST TECHNIQUE: Multidetector CT imaging of the abdomen and pelvis was performed following the standard protocol without IV contrast. COMPARISON:  Sep 11, 2019. FINDINGS: Lower chest: No acute abnormality. Hepatobiliary: No focal liver abnormality is seen. Status post cholecystectomy. No biliary dilatation. Pancreas: Fatty replacement of the pancreas is noted. Spleen: Normal in size without focal abnormality. Adrenals/Urinary Tract: Adrenal glands appear normal. Left renal cyst is noted. Small nonobstructive right renal cyst is noted. Mild right hydroureteronephrosis is noted without obstructing calculus. Urinary bladder is unremarkable. Left renal cyst is noted. Stomach/Bowel: Stomach is within normal limits. Appendix appears normal. No evidence of bowel wall thickening, distention, or  inflammatory changes. Sigmoid diverticulosis is noted without inflammation. Vascular/Lymphatic: Aortic atherosclerosis. No enlarged abdominal or pelvic lymph nodes. Reproductive: Uterus and bilateral adnexa are unremarkable. Other: No abdominal wall hernia or abnormality. No abdominopelvic ascites. Musculoskeletal: No acute or significant osseous findings. IMPRESSION: Small nonobstructive right renal cyst. Mild right hydroureteronephrosis is noted without obstructing calculus. Sigmoid diverticulosis without inflammation. Aortic Atherosclerosis (ICD10-I70.0). Electronically Signed   By: Marijo Conception M.D.   On: 09/10/2020 16:15   DG Chest Port 1 View  Result Date: 09/08/2020 CLINICAL DATA:  Sepsis EXAM: PORTABLE CHEST 1 VIEW COMPARISON:  11/27/2019 FINDINGS: Minimal left basilar atelectasis. Lungs are otherwise clear. No pneumothorax or pleural effusion. Mild cardiomegaly is stable. Transcatheter aortic valve replacement has been performed. Pulmonary vascularity is normal. No acute bone abnormality. IMPRESSION: No active disease.  Stable cardiomegaly. Electronically Signed   By: Fidela [place] MD   On: 09/08/2020 04:55       Subjective: NO NEW COMPLAINTS.   Discharge Exam: Vitals:   09/11/20 1927 09/12/20 0412  BP: (!) 142/57 (!) 134/49  Pulse: 69 (!) 58  Resp: 19 18  Temp: 98 F (36.7 C) 97.8 F (36.6 C)  SpO2: 100% 95%   Vitals:   09/11/20 0915 09/11/20 1525  09/11/20 1927 09/12/20 0412  BP: 128/66 (!) 129/53 (!) 142/57 (!) 134/49  Pulse: 61 63 69 (!) 58  Resp:  16 19 18   Temp: 100.2 F (37.9 C) 98.4 F (36.9 C) 98 F (36.7 C) 97.8 F (36.6 C)  TempSrc: Oral Oral Oral Oral  SpO2: 90% 93% 100% 95%  Weight:      Height:        General: Pt is alert, awake, not in acute distress Cardiovascular: RRR, S1/S2 +, no rubs, no gallops Respiratory: CTA bilaterally, no wheezing, no rhonchi Abdominal: Soft, NT, ND, bowel sounds + Extremities: no edema, no cyanosis    The results  of significant diagnostics from this hospitalization (including imaging, microbiology, ancillary and laboratory) are listed below for reference.     Microbiology: Recent Results (from the past 240 hour(s))  Urine Culture     Status: Abnormal   Collection Time: 09/07/20  3:15 PM   Specimen: Urine  Result Value Ref Range Status   MICRO NUMBER: [id number]  Final   SPECIMEN QUALITY: Adequate  Final   Sample Source NOT GIVEN  Final   STATUS: FINAL  Final   ISOLATE 1: Escherichia coli (A)  Final    Comment: Greater than 100,000 CFU/mL of Escherichia coli      Susceptibility   Escherichia coli - URINE CULTURE, REFLEX    AMOX/CLAVULANIC 4 Sensitive     AMPICILLIN >=32 Resistant     AMPICILLIN/SULBACTAM >=32 Resistant     CEFAZOLIN* <=4 Not Reportable      * For infections other than uncomplicated UTIcaused by E. coli, K. pneumoniae or P. mirabilis:Cefazolin is resistant if MIC > or = 8 mcg/mL.(Distinguishing susceptible versus intermediatefor isolates with MIC < or = 4 mcg/mL requiresadditional testing.)For uncomplicated UTI caused by E. coli,K. pneumoniae or P. mirabilis: Cefazolin issusceptible if MIC <32 mcg/mL and predictssusceptible to the oral agents cefaclor, cefdinir,cefpodoxime, cefprozil, cefuroxime, cephalexinand loracarbef.    CEFEPIME <=1 Sensitive     CEFTRIAXONE <=1 Sensitive     CIPROFLOXACIN <=0.25 Sensitive     LEVOFLOXACIN <=0.12 Sensitive     ERTAPENEM <=0.5 Sensitive     GENTAMICIN <=1 Sensitive     IMIPENEM <=0.25 Sensitive     NITROFURANTOIN <=16 Sensitive     PIP/TAZO <=4 Sensitive     TOBRAMYCIN <=1 Sensitive     TRIMETH/SULFA* <=20 Sensitive      * For infections other than uncomplicated UTIcaused by E. coli, K. pneumoniae or P. mirabilis:Cefazolin is resistant if MIC > or = 8 mcg/mL.(Distinguishing susceptible versus intermediatefor isolates with MIC < or = 4 mcg/mL requiresadditional testing.)For uncomplicated UTI caused by E. coli,K. pneumoniae or P. mirabilis:  Cefazolin issusceptible if MIC <32 mcg/mL and predictssusceptible to the oral agents cefaclor, cefdinir,cefpodoxime, cefprozil, cefuroxime, cephalexinand loracarbef.Legend:S = Susceptible  I = IntermediateR = Resistant  NS = Not susceptible* = Not tested  NR = Not reported**NN = See antimicrobic comments  Blood culture (routine single)     Status: Abnormal   Collection Time: 09/08/20  4:10 AM   Specimen: BLOOD  Result Value Ref Range Status   Specimen Description BLOOD RIGHT ANTECUBITAL  Final   Special Requests   Final    BOTTLES DRAWN AEROBIC AND ANAEROBIC Blood Culture adequate volume   Culture  Setup Time   Final    GRAM NEGATIVE RODS AEROBIC BOTTLE ONLY CRITICAL RESULT CALLED TO, READ BACK BY AND VERIFIED WITH: PHARMD GREG A. [phone] FCP Performed at [facility], [place]  [address] [place], [place] [address]    Culture ESCHERICHIA COLI (A)  Final   Report Status 09/10/2020 FINAL  Final   Organism ID, Bacteria ESCHERICHIA COLI  Final      Susceptibility   Escherichia coli - MIC*    AMPICILLIN >=32 RESISTANT Resistant     CEFAZOLIN <=4 SENSITIVE Sensitive     CEFEPIME <=0.12 SENSITIVE Sensitive     CEFTAZIDIME <=1 SENSITIVE Sensitive     CEFTRIAXONE <=0.25 SENSITIVE Sensitive     CIPROFLOXACIN <=0.25 SENSITIVE Sensitive     GENTAMICIN <=1 SENSITIVE Sensitive     IMIPENEM <=0.25 SENSITIVE Sensitive     TRIMETH/SULFA <=20 SENSITIVE Sensitive     AMPICILLIN/SULBACTAM >=32 RESISTANT Resistant     PIP/TAZO <=4 SENSITIVE Sensitive     * ESCHERICHIA COLI  Blood Culture ID Panel (Reflexed)     Status: Abnormal   Collection Time: 09/08/20  4:10 AM  Result Value Ref Range Status   Enterococcus faecalis NOT DETECTED NOT DETECTED Final   Enterococcus Faecium NOT DETECTED NOT DETECTED Final   Listeria monocytogenes NOT DETECTED NOT DETECTED Final   Staphylococcus species NOT DETECTED NOT DETECTED Final   Staphylococcus aureus (BCID) NOT DETECTED NOT DETECTED Final    Staphylococcus epidermidis NOT DETECTED NOT DETECTED Final   Staphylococcus lugdunensis NOT DETECTED NOT DETECTED Final   Streptococcus species NOT DETECTED NOT DETECTED Final   Streptococcus agalactiae NOT DETECTED NOT DETECTED Final   Streptococcus pneumoniae NOT DETECTED NOT DETECTED Final   Streptococcus pyogenes NOT DETECTED NOT DETECTED Final   A.calcoaceticus-baumannii NOT DETECTED NOT DETECTED Final   Bacteroides fragilis NOT DETECTED NOT DETECTED Final   Enterobacterales DETECTED (A) NOT DETECTED Final    Comment: Enterobacterales represent a large order of gram negative bacteria, not a single organism. [place] CRITICAL RESULT CALLED TO, READ BACK BY AND VERIFIED WITH: PHARMD GREG A. [phone] FCP    Enterobacter cloacae complex NOT DETECTED NOT DETECTED Final   Escherichia coli DETECTED (A) NOT DETECTED Final    Comment: [facility] BIOFIRE FILM ARRAY CRITICAL RESULT CALLED TO, READ BACK BY AND VERIFIED WITH: PHARMD GREG A. [phone] FCP    Klebsiella aerogenes NOT DETECTED NOT DETECTED Final   Klebsiella oxytoca NOT DETECTED NOT DETECTED Final   Klebsiella pneumoniae NOT DETECTED NOT DETECTED Final   Proteus species NOT DETECTED NOT DETECTED Final   Salmonella species NOT DETECTED NOT DETECTED Final   Serratia marcescens NOT DETECTED NOT DETECTED Final   Haemophilus influenzae NOT DETECTED NOT DETECTED Final   Neisseria meningitidis NOT DETECTED NOT DETECTED Final   Pseudomonas aeruginosa NOT DETECTED NOT DETECTED Final   Stenotrophomonas maltophilia NOT DETECTED NOT DETECTED Final   Candida albicans NOT DETECTED NOT DETECTED Final   Candida auris NOT DETECTED NOT DETECTED Final   Candida glabrata NOT DETECTED NOT DETECTED Final   Candida krusei NOT DETECTED NOT DETECTED Final   Candida parapsilosis NOT DETECTED NOT DETECTED Final   Candida tropicalis NOT DETECTED NOT DETECTED Final   Cryptococcus neoformans/gattii NOT DETECTED NOT DETECTED Final   CTX-M ESBL  NOT DETECTED NOT DETECTED Final   Carbapenem resistance IMP NOT DETECTED NOT DETECTED Final   Carbapenem resistance KPC NOT DETECTED NOT DETECTED Final   Carbapenem resistance NDM NOT DETECTED NOT DETECTED Final   Carbapenem resist OXA 48 LIKE NOT DETECTED NOT DETECTED Final   Carbapenem resistance VIM NOT DETECTED NOT DETECTED Final    Comment: Performed at [facility], [address],  [place], [place] [address]  Urine culture     Status: Abnormal   Collection Time: 09/08/20  4:16 AM   Specimen: In/Out Cath Urine  Result Value Ref Range Status   Specimen Description IN/OUT CATH URINE  Final   Special Requests   Final    NONE Performed at [facility], [place] [address] [place], [place] [address]    Culture >=100,000 COLONIES/mL ESCHERICHIA COLI (A)  Final   Report Status 09/10/2020 FINAL  Final   Organism ID, Bacteria ESCHERICHIA COLI (A)  Final      Susceptibility   Escherichia coli - MIC*    AMPICILLIN >=32 RESISTANT Resistant     CEFAZOLIN <=4 SENSITIVE Sensitive     CEFEPIME <=0.12 SENSITIVE Sensitive     CEFTRIAXONE <=0.25 SENSITIVE Sensitive     CIPROFLOXACIN <=0.25 SENSITIVE Sensitive     GENTAMICIN <=1 SENSITIVE Sensitive     IMIPENEM <=0.25 SENSITIVE Sensitive     NITROFURANTOIN <=16 SENSITIVE Sensitive     TRIMETH/SULFA <=20 SENSITIVE Sensitive     AMPICILLIN/SULBACTAM >=32 RESISTANT Resistant     PIP/TAZO <=4 SENSITIVE Sensitive     * >=100,000 COLONIES/mL ESCHERICHIA COLI  Resp Panel by RT-PCR (Flu A&B, Covid) Nasopharyngeal Swab     Status: None   Collection Time: 09/08/20  4:16 AM   Specimen: Nasopharyngeal Swab; Nasopharyngeal(NP) swabs in vial transport medium  Result Value Ref Range Status   SARS Coronavirus 2 by RT PCR NEGATIVE NEGATIVE Final    Comment: (NOTE) SARS-CoV-2 target nucleic acids are NOT DETECTED.  The SARS-CoV-2 RNA is generally detectable in upper respiratory specimens during the acute phase of infection. The lowest concentration  of SARS-CoV-2 viral copies this assay can detect is 138 copies/mL. A negative result does not preclude SARS-Cov-2 infection and should not be used as the sole basis for treatment or other patient management decisions. A negative result may occur with  improper specimen collection/handling, submission of specimen other than nasopharyngeal swab, presence of viral mutation(s) within the areas targeted by this assay, and inadequate number of viral copies(<138 copies/mL). A negative result must be combined with clinical observations, patient history, and epidemiological information. The expected result is Negative.  Fact Sheet for Patients:  EntrepreneurPulse.com.au  Fact Sheet for Healthcare Providers:  IncredibleEmployment.be  This test is no t yet approved or cleared by the [place] FDA and  has been authorized for detection and/or diagnosis of SARS-CoV-2 by FDA under an Emergency Use Authorization (EUA). This EUA will remain  in effect (meaning this test can be used) for the duration of the COVID-19 declaration under Section 564(b)(1) of the Act, 21 U.S.C.section 360bbb-3(b)(1), unless the authorization is terminated  or revoked sooner.       Influenza A by PCR NEGATIVE NEGATIVE Final   Influenza B by PCR NEGATIVE NEGATIVE Final    Comment: (NOTE) The Xpert Xpress SARS-CoV-2/FLU/RSV plus assay is intended as an aid in the diagnosis of influenza from Nasopharyngeal swab specimens and should not be used as a sole basis for treatment. Nasal washings and aspirates are unacceptable for Xpert Xpress SARS-CoV-2/FLU/RSV testing.  Fact Sheet for Patients: EntrepreneurPulse.com.au  Fact Sheet for Healthcare Providers: IncredibleEmployment.be  This test is not yet approved or cleared by the [place] FDA and has been authorized for detection and/or diagnosis of SARS-CoV-2 by FDA under an Emergency Use  Authorization (EUA). This EUA will remain in effect (meaning this test can be used) for the duration of the COVID-19 declaration under Section 564(b)(1)  of the Act, 21 U.S.C. section 360bbb-3(b)(1), unless the authorization is terminated or revoked.  Performed at [facility], [place] [address] [place], [place] [address]   Culture, blood (single)     Status: None (Preliminary result)   Collection Time: 09/08/20  4:37 PM   Specimen: BLOOD RIGHT HAND  Result Value Ref Range Status   Specimen Description BLOOD RIGHT HAND  Final   Special Requests   Final    BOTTLES DRAWN AEROBIC AND ANAEROBIC Blood Culture adequate volume   Culture   Final    NO GROWTH 4 DAYS Performed at [facility], [place] [address], [place], [place] [address]    Report Status PENDING  Incomplete  Culture, blood (Routine X 2) w Reflex to ID Panel     Status: None (Preliminary result)   Collection Time: 09/10/20  7:51 PM   Specimen: BLOOD LEFT HAND  Result Value Ref Range Status   Specimen Description BLOOD LEFT HAND  Final   Special Requests   Final    BOTTLES DRAWN AEROBIC ONLY Blood Culture results may not be optimal due to an inadequate volume of blood received in culture bottles   Culture   Final    NO GROWTH 2 DAYS Performed at [facility], [place] [address] [place], [place] [address]    Report Status PENDING  Incomplete  Culture, blood (Routine X 2) w Reflex to ID Panel     Status: None (Preliminary result)   Collection Time: 09/10/20  8:00 PM   Specimen: BLOOD RIGHT HAND  Result Value Ref Range Status   Specimen Description BLOOD RIGHT HAND  Final   Special Requests   Final    BOTTLES DRAWN AEROBIC ONLY Blood Culture results may not be optimal due to an inadequate volume of blood received in culture bottles   Culture   Final    NO GROWTH 2 DAYS Performed at [facility], [place] [address] [place] [address], [place], [place] [address]    Report Status PENDING  Incomplete     Labs: BNP (last 3  results) No results for input(s): BNP in the last 8760 hours. Basic Metabolic Panel: Recent Labs  Lab 09/07/20 0920 09/08/20 0416 09/09/20 0257 09/10/20 0141 09/11/20 0320  NA 136 134* 134* 135 137  K 4.3 4.3 3.9 4.1 3.6  CL 104 105 106 105 106  CO2 21* 18* 19* 21* 23  GLUCOSE 264* 214* 145* 127* 106*  BUN 27* 30* 22 17 16   CREATININE 0.83 0.96 0.73 0.77 0.74  CALCIUM 10.4* 9.4 9.1 9.6 9.2   Liver Function Tests: Recent Labs  Lab 09/07/20 0920 09/08/20 0416 09/09/20 0257  AST 23 41 28  ALT 54* 64* 48*  ALKPHOS 77 75 57  BILITOT 1.3* 1.3* 0.8  PROT 7.2 5.9* 5.3*  ALBUMIN 3.8 3.2* 2.7*   No results for input(s): LIPASE, AMYLASE in the last 168 hours. Recent Labs  Lab 09/08/20 1637  AMMONIA 31   CBC: Recent Labs  Lab 09/07/20 0920 09/08/20 0416 09/08/20 1637 09/09/20 0257 09/10/20 0141 09/11/20 0320 09/12/20 0722  WBC 19.0* 12.9*  --  12.2* 8.9 3.9* 4.6  NEUTROABS 17.0* 11.9*  --   --  7.2 2.6  --   HGB 11.1* 9.9* 8.7* 9.1* 10.3* 9.3* 8.2*  HCT 35.1* 31.9* 27.3* 28.8* 33.2* 29.1* 26.5*  MCV 65.0* 66.3*  --  64.9* 65.2* 63.8* 64.2*  PLT 163 148*  --  PLATELET CLUMPS NOTED ON SMEAR, UNABLE  TO ESTIMATE 100* 95* 109*   Cardiac Enzymes: No results for input(s): CKTOTAL, CKMB, CKMBINDEX, TROPONINI in the last 168 hours. BNP: Invalid input(s): POCBNP CBG: Recent Labs  Lab 09/11/20 1122 09/11/20 1548 09/11/20 2132 09/11/20 2259 09/12/20 0806  GLUCAP 223* 237* 81 101* 94   D-Dimer No results for input(s): DDIMER in the last 72 hours. Hgb A1c No results for input(s): HGBA1C in the last 72 hours. Lipid Profile No results for input(s): CHOL, HDL, LDLCALC, TRIG, CHOLHDL, LDLDIRECT in the last 72 hours. Thyroid function studies No results for input(s): TSH, T4TOTAL, T3FREE, THYROIDAB in the last 72 hours.  Invalid input(s): FREET3 Anemia work up No results for input(s): VITAMINB12, FOLATE, FERRITIN, TIBC, IRON, RETICCTPCT in the last 72  hours. Urinalysis    Component Value Date/Time   COLORURINE AMBER (A) 09/08/2020 0416   APPEARANCEUR CLOUDY (A) 09/08/2020 0416   LABSPEC 1.013 09/08/2020 0416   PHURINE 5.0 09/08/2020 0416   GLUCOSEU NEGATIVE 09/08/2020 0416   HGBUR MODERATE (A) 09/08/2020 0416   HGBUR negative 12/13/2008 1035   BILIRUBINUR NEGATIVE 09/08/2020 0416   BILIRUBINUR neg 09/07/2020 1422   KETONESUR NEGATIVE 09/08/2020 0416   PROTEINUR 30 (A) 09/08/2020 0416   UROBILINOGEN 0.2 09/07/2020 1422   UROBILINOGEN 0.2 09/18/2011 2049   NITRITE NEGATIVE 09/08/2020 0416   LEUKOCYTESUR LARGE (A) 09/08/2020 0416   Sepsis Labs Invalid input(s): PROCALCITONIN,  WBC,  LACTICIDVEN Microbiology Recent Results (from the past 240 hour(s))  Urine Culture     Status: Abnormal   Collection Time: 09/07/20  3:15 PM   Specimen: Urine  Result Value Ref Range Status   MICRO NUMBER: [id number]  Final   SPECIMEN QUALITY: Adequate  Final   Sample Source NOT GIVEN  Final   STATUS: FINAL  Final   ISOLATE 1: Escherichia coli (A)  Final    Comment: Greater than 100,000 CFU/mL of Escherichia coli      Susceptibility   Escherichia coli - URINE CULTURE, REFLEX    AMOX/CLAVULANIC 4 Sensitive     AMPICILLIN >=32 Resistant     AMPICILLIN/SULBACTAM >=32 Resistant     CEFAZOLIN* <=4 Not Reportable      * For infections other than uncomplicated UTIcaused by E. coli, K. pneumoniae or P. mirabilis:Cefazolin is resistant if MIC > or = 8 mcg/mL.(Distinguishing susceptible versus intermediatefor isolates with MIC < or = 4 mcg/mL requiresadditional testing.)For uncomplicated UTI caused by E. coli,K. pneumoniae or P. mirabilis: Cefazolin issusceptible if MIC <32 mcg/mL and predictssusceptible to the oral agents cefaclor, cefdinir,cefpodoxime, cefprozil, cefuroxime, cephalexinand loracarbef.    CEFEPIME <=1 Sensitive     CEFTRIAXONE <=1 Sensitive     CIPROFLOXACIN <=0.25 Sensitive     LEVOFLOXACIN <=0.12 Sensitive     ERTAPENEM <=0.5 Sensitive      GENTAMICIN <=1 Sensitive     IMIPENEM <=0.25 Sensitive     NITROFURANTOIN <=16 Sensitive     PIP/TAZO <=4 Sensitive     TOBRAMYCIN <=1 Sensitive     TRIMETH/SULFA* <=20 Sensitive      * For infections other than uncomplicated UTIcaused by E. coli, K. pneumoniae or P. mirabilis:Cefazolin is resistant if MIC > or = 8 mcg/mL.(Distinguishing susceptible versus intermediatefor isolates with MIC < or = 4 mcg/mL requiresadditional testing.)For uncomplicated UTI caused by E. coli,K. pneumoniae or P. mirabilis: Cefazolin issusceptible if MIC <32 mcg/mL and predictssusceptible to the oral agents cefaclor, cefdinir,cefpodoxime, cefprozil, cefuroxime, cephalexinand loracarbef.Legend:S = Susceptible  I = IntermediateR = Resistant  NS = Not susceptible* = Not tested  NR = Not reported**NN = See antimicrobic comments  Blood culture (routine single)     Status: Abnormal   Collection Time: 09/08/20  4:10 AM   Specimen: BLOOD  Result Value Ref Range Status   Specimen Description BLOOD RIGHT ANTECUBITAL  Final   Special Requests   Final    BOTTLES DRAWN AEROBIC AND ANAEROBIC Blood Culture adequate volume   Culture  Setup Time   Final    GRAM NEGATIVE RODS AEROBIC BOTTLE ONLY CRITICAL RESULT CALLED TO, READ BACK BY AND VERIFIED WITH: PHARMD GREG A. [id number] [id number] FCP Performed at [facility], [place] [address] [place], [place] [address]    Culture ESCHERICHIA COLI (A)  Final   Report Status 09/10/2020 FINAL  Final   Organism ID, Bacteria ESCHERICHIA COLI  Final      Susceptibility   Escherichia coli - MIC*    AMPICILLIN >=32 RESISTANT Resistant     CEFAZOLIN <=4 SENSITIVE Sensitive     CEFEPIME <=0.12 SENSITIVE Sensitive     CEFTAZIDIME <=1 SENSITIVE Sensitive     CEFTRIAXONE <=0.25 SENSITIVE Sensitive     CIPROFLOXACIN <=0.25 SENSITIVE Sensitive     GENTAMICIN <=1 SENSITIVE Sensitive     IMIPENEM <=0.25 SENSITIVE Sensitive     TRIMETH/SULFA <=20 SENSITIVE Sensitive     AMPICILLIN/SULBACTAM >=32  RESISTANT Resistant     PIP/TAZO <=4 SENSITIVE Sensitive     * ESCHERICHIA COLI  Blood Culture ID Panel (Reflexed)     Status: Abnormal   Collection Time: 09/08/20  4:10 AM  Result Value Ref Range Status   Enterococcus faecalis NOT DETECTED NOT DETECTED Final   Enterococcus Faecium NOT DETECTED NOT DETECTED Final   Listeria monocytogenes NOT DETECTED NOT DETECTED Final   Staphylococcus species NOT DETECTED NOT DETECTED Final   Staphylococcus aureus (BCID) NOT DETECTED NOT DETECTED Final   Staphylococcus epidermidis NOT DETECTED NOT DETECTED Final   Staphylococcus lugdunensis NOT DETECTED NOT DETECTED Final   Streptococcus species NOT DETECTED NOT DETECTED Final   Streptococcus agalactiae NOT DETECTED NOT DETECTED Final   Streptococcus pneumoniae NOT DETECTED NOT DETECTED Final   Streptococcus pyogenes NOT DETECTED NOT DETECTED Final   A.calcoaceticus-baumannii NOT DETECTED NOT DETECTED Final   Bacteroides fragilis NOT DETECTED NOT DETECTED Final   Enterobacterales DETECTED (A) NOT DETECTED Final    Comment: Enterobacterales represent a large order of gram negative bacteria, not a single organism. [place] CRITICAL RESULT CALLED TO, READ BACK BY AND VERIFIED WITH: PHARMD GREG A. [id number] FCP    Enterobacter cloacae complex NOT DETECTED NOT DETECTED Final   Escherichia coli DETECTED (A) NOT DETECTED Final    Comment: [facility] BIOFI[facility] FILM ARRAY CRITICAL RESULT CALLED TO, READ BACK BY AND VERIFIED WITH: PHARMD GREG A. [phone] FCP    Klebsiella aerogenes NOT DETECTED NOT DETECTED Final   Klebsiella oxytoca NOT DETECTED NOT DETECTED Final   Klebsiella pneumoniae NOT DETECTED NOT DETECTED Final   Proteus species NOT DETECTED NOT DETECTED Final   Salmonella species NOT DETECTED NOT DETECTED Final   Serratia marcescens NOT DETECTED NOT DETECTED Final   Haemophilus influenzae NOT DETECTED NOT DETECTED Final   Neisseria meningitidis NOT DETECTED NOT DETECTED Final    Pseudomonas aeruginosa NOT DETECTED NOT DETECTED Final   Stenotrophomonas maltophilia NOT DETECTED NOT DETECTED Final   Candida albicans NOT DETECTED NOT DETECTED Final   Candida auris NOT DETECTED NOT DETECTED Final   Candida glabrata NOT DETECTED NOT DETECTED Final   Candida krusei  NOT DETECTED NOT DETECTED Final   Candida parapsilosis NOT DETECTED NOT DETECTED Final   Candida tropicalis NOT DETECTED NOT DETECTED Final   Cryptococcus neoformans/gattii NOT DETECTED NOT DETECTED Final   CTX-M ESBL NOT DETECTED NOT DETECTED Final   Carbapenem resistance IMP NOT DETECTED NOT DETECTED Final   Carbapenem resistance KPC NOT DETECTED NOT DETECTED Final   Carbapenem resistance NDM NOT DETECTED NOT DETECTED Final   Carbapenem resist OXA 48 LIKE NOT DETECTED NOT DETECTED Final   Carbapenem resistance VIM NOT DETECTED NOT DETECTED Final    Comment: Performed at [facility], [place] [address], [place], [place] [address]  Urine culture     Status: Abnormal   Collection Time: 09/08/20  4:16 AM   Specimen: In/Out Cath Urine  Result Value Ref Range Status   Specimen Description IN/OUT CATH URINE  Final   Special Requests   Final    NONE Performed at [facility], [place] [address], [place], [place] [address]    Culture >=100,000 COLONIES/mL ESCHERICHIA COLI (A)  Final   Report Status 09/10/2020 FINAL  Final   Organism ID, Bacteria ESCHERICHIA COLI (A)  Final      Susceptibility   Escherichia coli - MIC*    AMPICILLIN >=32 RESISTANT Resistant     CEFAZOLIN <=4 SENSITIVE Sensitive     CEFEPIME <=0.12 SENSITIVE Sensitive     CEFTRIAXONE <=0.25 SENSITIVE Sensitive     CIPROFLOXACIN <=0.25 SENSITIVE Sensitive     GENTAMICIN <=1 SENSITIVE Sensitive     IMIPENEM <=0.25 SENSITIVE Sensitive     NITROFURANTOIN <=16 SENSITIVE Sensitive     TRIMETH/SULFA <=20 SENSITIVE Sensitive     AMPICILLIN/SULBACTAM >=32 RESISTANT Resistant     PIP/TAZO <=4 SENSITIVE Sensitive     * >=100,000 COLONIES/mL  ESCHERICHIA COLI  Resp Panel by RT-PCR (Flu A&B, Covid) Nasopharyngeal Swab     Status: None   Collection Time: 09/08/20  4:16 AM   Specimen: Nasopharyngeal Swab; Nasopharyngeal(NP) swabs in vial transport medium  Result Value Ref Range Status   SARS Coronavirus 2 by RT PCR NEGATIVE NEGATIVE Final    Comment: (NOTE) SARS-CoV-2 target nucleic acids are NOT DETECTED.  The SARS-CoV-2 RNA is generally detectable in upper respiratory specimens during the acute phase of infection. The lowest concentration of SARS-CoV-2 viral copies this assay can detect is 138 copies/mL. A negative result does not preclude SARS-Cov-2 infection and should not be used as the sole basis for treatment or other patient management decisions. A negative result may occur with  improper specimen collection/handling, submission of specimen other than nasopharyngeal swab, presence of viral mutation(s) within the areas targeted by this assay, and inadequate number of viral copies(<138 copies/mL). A negative result must be combined with clinical observations, patient history, and epidemiological information. The expected result is Negative.  Fact Sheet for Patients:  EntrepreneurPulse.com.au  Fact Sheet for Healthcare Providers:  IncredibleEmployment.be  This test is no t yet approved or cleared by the [place] FDA and  has been authorized for detection and/or diagnosis of SARS-CoV-2 by FDA under an Emergency Use Authorization (EUA). This EUA will remain  in effect (meaning this test can be used) for the duration of the COVID-19 declaration under Section 564(b)(1) of the Act, 21 U.S.C.section 360bbb-3(b)(1), unless the authorization is terminated  or revoked sooner.       Influenza A by PCR NEGATIVE NEGATIVE Final   Influenza B by PCR NEGATIVE NEGATIVE Final    Comment: (NOTE) The Xpert Xpress  SARS-CoV-2/FLU/RSV plus assay is intended as an aid in the diagnosis of  influenza from Nasopharyngeal swab specimens and should not be used as a sole basis for treatment. Nasal washings and aspirates are unacceptable for Xpert Xpress SARS-CoV-2/FLU/RSV testing.  Fact Sheet for Patients: EntrepreneurPulse.com.au  Fact Sheet for Healthcare Providers: IncredibleEmployment.be  This test is not yet approved or cleared by the [place] FDA and has been authorized for detection and/or diagnosis of SARS-CoV-2 by FDA under an Emergency Use Authorization (EUA). This EUA will remain in effect (meaning this test can be used) for the duration of the COVID-19 declaration under Section 564(b)(1) of the Act, 21 U.S.C. section 360bbb-3(b)(1), unless the authorization is terminated or revoked.  Performed at [facility], [place] [address] [place], [place] [address]   Culture, blood (single)     Status: None (Preliminary result)   Collection Time: 09/08/20  4:37 PM   Specimen: BLOOD RIGHT HAND  Result Value Ref Range Status   Specimen Description BLOOD RIGHT HAND  Final   Special Requests   Final    BOTTLES DRAWN AEROBIC AND ANAEROBIC Blood Culture adequate volume   Culture   Final    NO GROWTH 4 DAYS Performed at [facility], [place] [address], [place], [place] [address]    Report Status PENDING  Incomplete  Culture, blood (Routine X 2) w Reflex to ID Panel     Status: None (Preliminary result)   Collection Time: 09/10/20  7:51 PM   Specimen: BLOOD LEFT HAND  Result Value Ref Range Status   Specimen Description BLOOD LEFT HAND  Final   Special Requests   Final    BOTTLES DRAWN AEROBIC ONLY Blood Culture results may not be optimal due to an inadequate volume of blood received in culture bottles   Culture   Final    NO GROWTH 2 DAYS Performed at [facility], [place] [address] [place], [place] [address]    Report Status PENDING  Incomplete  Culture, blood (Routine X 2) w Reflex to ID Panel     Status: None  (Preliminary result)   Collection Time: 09/10/20  8:00 PM   Specimen: BLOOD RIGHT HAND  Result Value Ref Range Status   Specimen Description BLOOD RIGHT HAND  Final   Special Requests   Final    BOTTLES DRAWN AEROBIC ONLY Blood Culture results may not be optimal due to an inadequate volume of blood received in culture bottles   Culture   Final    NO GROWTH 2 DAYS Performed at [facility], [place] [address] [place], [place] [address]    Report Status PENDING  Incomplete     Time coordinating discharge: 4 MINUTES  SIGNED:   Hosie Poisson, MD  [facility] 09/12/2020, 5:21 PM

## 2020-09-12 NOTE — Consult Note (Signed)
   [facility] Inpatient Consult   09/12/2020  Breanna Webster Oct 09, 1945 [id number]  [place] Organization [ACO] Patient:  Medicare CMS DCE  Primary Care Provider: [name], NP at [facility], which has an [place] Management team.  Patient has been active with Home Management for chronic disease management services.  Patient has been engaged by a [place].  Our community based plan of care has focused on disease management and community resource support.    Patient transitioned home today prior to speaking with her.  Plan: Call attempt to check with patient regarding the chronic care management program available to her from her primary care provider, and was able to leave a HIPAA accepted voicemail message requesting a return call. If no return call is received, this Probation officer will follow up with the Embedded team and send a referral.     Of note, [facility] Care Management services does not replace or interfere with any services that are needed or arranged by inpatient [facility] care management team.  For additional questions or referrals please contact:  Natividad Brood, RN BSN [facility] Liaison  [phone] business mobile phone Toll free office [phone]  Fax number: [phone] [email or web address]

## 2020-09-12 NOTE — Care Management Important Message (Signed)
Important Message  Patient Details  Name: Breanna Webster MRN: [id number] Date of Birth: May 20, 1945   Medicare Important Message Given:  Yes     [name] 09/12/2020, 10:21 AM

## 2020-09-13 ENCOUNTER — Telehealth: Payer: Self-pay | Admitting: *Deleted

## 2020-09-13 ENCOUNTER — Encounter: Payer: Self-pay | Admitting: Hematology

## 2020-09-13 ENCOUNTER — Other Ambulatory Visit: Payer: Self-pay

## 2020-09-13 DIAGNOSIS — E0865 Diabetes mellitus due to underlying condition with hyperglycemia: Secondary | ICD-10-CM

## 2020-09-13 DIAGNOSIS — I10 Essential (primary) hypertension: Secondary | ICD-10-CM

## 2020-09-13 DIAGNOSIS — A419 Sepsis, unspecified organism: Secondary | ICD-10-CM

## 2020-09-13 LAB — CULTURE, BLOOD (SINGLE)
Culture: NO GROWTH
Special Requests: ADEQUATE
Special Requests: ADEQUATE

## 2020-09-13 NOTE — Patient Outreach (Signed)
[facility]) Care Management  [place]  09/13/2020   YOSHIKA VENSEL 1945/12/12 [id number]  Follow up for [facility] RN Chronic Care Management.  Patient was active with Media prior to hospital admission, referral for transitioning to Embedded team for ongoing follow up for chronic care management.  Natividad Brood, RN BSN [facility] Liaison  [phone] business mobile phone Toll free office [phone]  Fax number: [phone] [email or web address]

## 2020-09-13 NOTE — Chronic Care Management (AMB) (Signed)
  Chronic Care Management   Outreach Note  09/13/2020 Name: Breanna Webster MRN: [id number] DOB: 1946/02/17  Breanna Webster is a 75 y.o. year old female who is a primary care patient of Breanna Peng, NP. I reached out to Breanna Webster by phone today in response to a referral sent by Breanna Webster PCP, Breanna Peng, NP.     A telephone outreach was attempted today. The patient was referred to the case management team for assistance with care management and care coordination.   Follow Up Plan: The care management team will reach out to the patient again over the next 7 days. If patient returns call to provider office, please advise to call [place] at [phone].  [place] Management

## 2020-09-14 ENCOUNTER — Telehealth: Payer: Self-pay

## 2020-09-14 NOTE — Telephone Encounter (Signed)
Transition Care Management Unsuccessful Follow-up Telephone Call  Date of discharge and from where:  09/12/2020  [facility]   Attempts:  1st Attempt  Reason for unsuccessful TCM follow-up call:  Left voice message

## 2020-09-15 ENCOUNTER — Ambulatory Visit: Payer: Self-pay | Admitting: *Deleted

## 2020-09-15 LAB — CULTURE, BLOOD (ROUTINE X 2)
Culture: NO GROWTH
Culture: NO GROWTH

## 2020-09-16 ENCOUNTER — Ambulatory Visit (INDEPENDENT_AMBULATORY_CARE_PROVIDER_SITE_OTHER): Payer: Medicare Other | Admitting: Adult Health

## 2020-09-16 DIAGNOSIS — I480 Paroxysmal atrial fibrillation: Secondary | ICD-10-CM

## 2020-09-16 DIAGNOSIS — I6930 Unspecified sequelae of cerebral infarction: Secondary | ICD-10-CM

## 2020-09-16 DIAGNOSIS — Z8619 Personal history of other infectious and parasitic diseases: Secondary | ICD-10-CM

## 2020-09-16 DIAGNOSIS — I10 Essential (primary) hypertension: Secondary | ICD-10-CM

## 2020-09-16 DIAGNOSIS — A419 Sepsis, unspecified organism: Secondary | ICD-10-CM

## 2020-09-16 DIAGNOSIS — Z952 Presence of prosthetic heart valve: Secondary | ICD-10-CM

## 2020-09-16 DIAGNOSIS — N390 Urinary tract infection, site not specified: Secondary | ICD-10-CM

## 2020-09-16 LAB — CBC WITH DIFFERENTIAL/PLATELET
Basophils Absolute: 0.1 10*3/uL (ref 0.0–0.1)
Basophils Relative: 0.7 % (ref 0.0–3.0)
Eosinophils Absolute: 0.1 10*3/uL (ref 0.0–0.7)
Eosinophils Relative: 1.5 % (ref 0.0–5.0)
HCT: 29.9 % — ABNORMAL LOW (ref 36.0–46.0)
Hemoglobin: 9.6 g/dL — ABNORMAL LOW (ref 12.0–15.0)
Lymphocytes Relative: 20.1 % (ref 12.0–46.0)
Lymphs Abs: 1.7 10*3/uL (ref 0.7–4.0)
MCHC: 32.2 g/dL (ref 30.0–36.0)
MCV: 63 fl — ABNORMAL LOW (ref 78.0–100.0)
Monocytes Absolute: 0.5 10*3/uL (ref 0.1–1.0)
Monocytes Relative: 6.3 % (ref 3.0–12.0)
Neutro Abs: 6.1 10*3/uL (ref 1.4–7.7)
Neutrophils Relative %: 71.4 % (ref 43.0–77.0)
Platelets: 276 10*3/uL (ref 150.0–400.0)
RBC: 4.75 Mil/uL (ref 3.87–5.11)
RDW: 14.7 % (ref 11.5–15.5)
WBC: 8.5 10*3/uL (ref 4.0–10.5)

## 2020-09-16 LAB — BASIC METABOLIC PANEL
BUN: 8 mg/dL (ref 6–23)
CO2: 23 mEq/L (ref 19–32)
Calcium: 9.7 mg/dL (ref 8.4–10.5)
Chloride: 107 mEq/L (ref 96–112)
Creatinine, Ser: 0.67 mg/dL (ref 0.40–1.20)
GFR: 84.65 mL/min (ref >60.00)
Glucose, Bld: 120 mg/dL — ABNORMAL HIGH (ref 70–99)
Potassium: 3.8 mEq/L (ref 3.5–5.1)
Sodium: 139 mEq/L (ref 135–145)

## 2020-09-16 NOTE — Progress Notes (Signed)
Subjective:    Patient ID: Breanna Webster, female    DOB: 01-14-1946, 75 y.o.   MRN: [id number]  HPI 75 year old female who  has a past medical history of Anxiety, Arthritis, Back pain, Blood transfusion without reported diagnosis, CAD (coronary artery disease), Carotid artery stenosis, Chest pain, Chronic lower back pain, Cirrhosis ([place]), Colon polyps, Diverticulitis, Diverticulosis, Esophageal thickening, Fatty liver, GERD (gastroesophageal reflux disease), Grave's disease, History of colonic polyps (05/22/2017), History of hiatal hernia, Hypertension, Hypothyroidism, IBS (irritable bowel syndrome), Osteopenia, Pulmonary nodules, S/P TAVR (transcatheter aortic valve replacement), Severe aortic stenosis, Shortness of breath on exertion, Stroke ([place]), Thalassemia minor, Thyroid disease, and Type II diabetes mellitus ([place]).  She presents with her husband for TCM visit   Admit Date 09/08/2020 Discharge Date 09/12/2020  Presented to the emergency room with complaints of discomfort with urination and fever.  Her work-up revealed a white count of 19 urinalysis was positive for UTI.  She was admitted for sepsis from UTI.  Her urine culture showed E. coli bacteremia  Hospital Course  1.  Sepsis from E. coli bacteremia and E. coli UTI present on admission -Added on IV Rocephin 2 g daily.  Sensitivities were reviewed and she was transitioned to Ancef.  Plan was for 10-day antibiotic treatment with Cipro -Advise follow-up with urology as outpatient -Her lactic acid and WBC count normalized upon discharge -Blood Cultures negative. -ID consulted for recommendations  2. H/o PAF - Rate Controlled. Continued on Eliquis   3. Insulin Dependent DM  -Resume Novolin N 25 units daily and SSI -Hemoglobin A1c was 7.4 -No changes in medications  4.  Microcytic anemia/thalassemia minor -Stool for occult blood is negative -Anemia panel shows adequate iron and ferritin is 351 -Baseline Hemoglobin ranged from  9-11  5. 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  Hyperbilirubinemia -Resolved  7.  Mild hyponatremia -Resolved with hydration -Continue to monitor  8.  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 Hypertension  - BP WNL - Cozaar was d/c on discharged  Today she reports that she is feeling better but continues to be fatigued ( on going issue).  He denies urinary symptoms, has not had any fevers chills since being discharged.  Has 1 more day of Cipro   Review of Systems  Constitutional: Positive for fatigue.  Respiratory: Negative.   Cardiovascular: Negative.   Gastrointestinal: Negative.   Genitourinary: Negative.   Neurological: Positive for weakness.  Psychiatric/Behavioral: Negative.   All other systems reviewed and are negative.  Past Medical History:  Diagnosis Date  . Anxiety   . Arthritis    "back" (04/22/2018)  . Back pain   . Blood transfusion without reported diagnosis   . CAD (coronary artery disease)    a. 03/2018 s/p PCI/DES to the RCA (3.0x15 Onyx DES).  . Carotid artery stenosis    Mild  . Chest pain   . Chronic lower back pain   . Cirrhosis ([place])   . Colon polyps   . Diverticulitis   . Diverticulosis   . Esophageal thickening    seen on pre TAVR CT scan, also questionable cirrhosis. MRI recommended. Will refer to GI  . Fatty liver   . GERD (gastroesophageal reflux disease)   . Grave's disease   . History of colonic polyps 05/22/2017  . History  of hiatal hernia   . Hypertension   . Hypothyroidism   . IBS (irritable bowel syndrome)   . Osteopenia   . Pulmonary nodules    seen on pre TAVR CT. likley benign. no follow up recommended if pt low risk.  . S/P TAVR (transcatheter aortic valve replacement)   . Severe aortic stenosis   . Shortness of breath on exertion   . Stroke ([place])   .  Thalassemia minor   . Thyroid disease   . Type II diabetes mellitus ([place])     Social History   Socioeconomic History  . Marital status: Married    Spouse name: Not on file  . Number of children: 2  . Years of education: Not on file  . Highest education level: Not on file  Occupational History  . Occupation: Tree surgeon of the [employer]  Tobacco Use  . Smoking status: Never Smoker  . Smokeless tobacco: Never Used  Vaping Use  . Vaping Use: Never used  Substance and Sexual Activity  . Alcohol use: No    Alcohol/week: 0.0 standard drinks  . Drug use: Never  . Sexual activity: Not Currently  Other Topics Concern  . Not on file  Social History Narrative   Lives with husband in a one story home.     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   Education: college.   Social Determinants of Health   Financial Resource Strain: Low Risk   . Difficulty of Paying Living Expenses: Not hard at all  Food Insecurity: No Food Insecurity  . Worried About Charity fundraiser in the Last Year: Never true  . Ran Out of Food in the Last Year: Never true  Transportation Needs: No Transportation Needs  . Lack of Transportation (Medical): No  . Lack of Transportation (Non-Medical): No  Physical Activity: Insufficiently Active  . Days of Exercise per Week: 2 days  . Minutes of Exercise per Session: 50 min  Stress: Not on file  Social Connections: Socially Integrated  . Frequency of Communication with Friends and Family: More than three times a week  . Frequency of Social Gatherings with Friends and Family: Once a week  . Attends Religious Services: More than 4 times per year  . Active Member of Clubs or Organizations: Yes  . Attends Archivist Meetings: More than 4 times per year  . Marital Status: Married  Human resources officer Violence: Not At Risk  . Fear of Current or Ex-Partner: No  . Emotionally  Abused: No  . Physically Abused: No  . Sexually Abused: No    Past Surgical History:  Procedure Laterality Date  . [place] STUDY N/A 03/03/2018   Procedure: [place] STUDY;  Surgeon: Mauri Pole, MD;  Location: [facility];  Service: Endoscopy;  Laterality: N/A;  . COLONOSCOPY    . COLONOSCOPY W/ BIOPSIES AND POLYPECTOMY    . CORONARY ANGIOGRAPHY Right 04/21/2018   Procedure: CORONARY ANGIOGRAPHY (CATH LAB);  Surgeon: [name], MD;  Location: [facility];  Service: Cardiovascular;  Laterality: Right;  . CORONARY STENT INTERVENTION N/A 04/22/2018   Procedure: CORONARY STENT INTERVENTION;  Surgeon: [name], MD;  Location: [facility];  Service: Cardiovascular;  Laterality: N/A;  . DILATION AND CURETTAGE OF UTERUS    . ESOPHAGEAL MANOMETRY N/A 03/03/2018   Procedure: ESOPHAGEAL MANOMETRY (EM);  Surgeon: Mauri Pole, MD;  Location: [facility];  Service: Endoscopy;  Laterality: N/A;  . GASTRIC FUNDOPLICATION    . HERNIA REPAIR    . HYSTEROSCOPY     fibroids  . LAPAROSCOPIC CHOLECYSTECTOMY    . LAPAROSCOPY     fibroids  . NISSEN FUNDOPLICATION  2725D  . POLYPECTOMY    . RIGHT/LEFT HEART CATH AND CORONARY ANGIOGRAPHY N/A 02/20/2018   Procedure: RIGHT/LEFT HEART CATH AND CORONARY ANGIOGRAPHY;  Surgeon: [name], MD;  Location: [facility];  Service: Cardiovascular;  Laterality: N/A;  . TEE WITHOUT CARDIOVERSION N/A 07/08/2018   Procedure: TRANSESOPHAGEAL ECHOCARDIOGRAM (TEE);  Surgeon: Burnell Blanks, MD;  Location: [facility];  Service: Open Heart Surgery;  Laterality: N/A;  . TEE WITHOUT CARDIOVERSION  10/07/2018  . TEE WITHOUT CARDIOVERSION N/A 10/07/2018   Procedure: TRANSESOPHAGEAL ECHOCARDIOGRAM (TEE);  Surgeon: Jerline Pain, MD;  Location: [facility] ENDOSCOPY;  Service: Cardiovascular;  Laterality: N/A;  . TONSILLECTOMY    . TRANSCATHETER AORTIC VALVE REPLACEMENT, TRANSFEMORAL N/A 07/08/2018   Procedure: TRANSCATHETER  AORTIC VALVE REPLACEMENT, TRANSFEMORAL;  Surgeon: Burnell Blanks, MD;  Location: [place] [facility];  Service: Open Heart Surgery;  Laterality: N/A;    Family History  Adopted: Yes  Problem Relation Age of Onset  . Healthy Son        x 2  . Headache Other        Cluster headaches  . Heart failure Mother   . Colon cancer Neg Hx   . Pancreatic cancer Neg Hx   . Rectal cancer Neg Hx   . Stomach cancer Neg Hx     Allergies  Allergen Reactions  . Statins Other (See Comments)    Muscle aches    Current Outpatient Medications on File Prior to Visit  Medication Sig Dispense Refill  . BD INSULIN SYRINGE U/F 31G X 5/16" 1 ML MISC FOUR TIMES A DAY UNDER THE SKIN 200 each 1  . cholecalciferol (VITAMIN D) 25 MCG (1000 UNIT) tablet Take 1,000 Units by mouth daily.    . ciprofloxacin (CIPRO) 500 MG tablet Take 1 tablet (500 mg total) by mouth 2 (two) times daily for 6 days. 12 tablet 0  . diltiazem (CARDIZEM CD) 120 MG 24 hr capsule TAKE ONE CAPSULE BY MOUTH DAILY (Patient taking differently: Take 120 mg by mouth daily.) 90 capsule 1  . ELIQUIS 5 MG TABS tablet TAKE ONE TABLET BY MOUTH TWICE A DAY (Patient taking differently: Take 5 mg by mouth 2 (two) times daily.) 60 tablet 2  . escitalopram (LEXAPRO) 20 MG tablet Take 20 mg by mouth daily.    . Lancets (ONETOUCH DELICA PLUS [id number]) MISC USE TO CHECK BLOOD SUGAR TWICE DAILY.. 200 each 0  . metFORMIN (GLUCOPHAGE-XR) 500 MG 24 hr tablet Take 500 mg by mouth 2 (two) times daily.    . Multiple Vitamin (MULITIVITAMIN WITH MINERALS) TABS Take 1 tablet by mouth daily.    . nitroGLYCERIN (NITROSTAT) 0.4 MG SL tablet Place 1 tablet (0.4 mg total) under the tongue every 5 (five) minutes as needed for chest pain. 25 tablet 3  . NOVOLIN N 100 UNIT/ML injection Inject 35 Units into the skin daily before breakfast.    . NOVOLIN R 100 UNIT/ML injection 10 UNITS THIRTY MINUTES BEFORE MEALS.  5 ADDITIONAL UNITS WITH CARBS OR SNACKS.    [name] VERIO test  strip USE TO MONITOR GLUCOSE LEVELS TWICE DAILY 200 strip 3  . SYNTHROID 137 MCG tablet Take 137 mcg by mouth daily.  No current facility-administered medications on file prior to visit.    There were no vitals taken for this visit.      Objective:   Physical Exam Vitals and nursing note reviewed.  Constitutional:      Appearance: Normal appearance. She is overweight.     Comments: Tired appearing   Cardiovascular:     Rate and Rhythm: Normal rate and regular rhythm.     Pulses: Normal pulses.     Heart sounds: Normal heart sounds.  Pulmonary:     Effort: Pulmonary effort is normal.     Breath sounds: Normal breath sounds.  Skin:    General: Skin is warm and dry.     Capillary Refill: Capillary refill takes less than 2 seconds.  Neurological:     General: No focal deficit present.     Mental Status: She is alert and oriented to person, place, and time.  Psychiatric:        Mood and Affect: Mood normal.        Behavior: Behavior normal.        Thought Content: Thought content normal.        Judgment: Judgment normal.       Assessment & Plan:  1. Essential hypertension -We will have her husband monitor the patient's blood pressure over the next week.  Can consider adding Cozaar back to her regimen. - CBC with Differential/Platelet; Future - Basic Metabolic Panel; Future - CBC with Differential/Platelet - Basic Metabolic Panel  2. Sepsis secondary to UTI [facility]) -Reviewed hospital notes, imaging, labs, and discharge instructions.  All questions answered to the best of my ability.  Finish antibiotic therapy.  Follow-up if needed - CBC with Differential/Platelet; Future - Basic Metabolic Panel; Future - CBC with Differential/Platelet - Basic Metabolic Panel  3. History of CVA with residual deficit - Continue with Eliquis  - CBC with Differential/Platelet; Future - Basic Metabolic Panel; Future - CBC with Differential/Platelet - Basic Metabolic Panel  4.  Hyperbilirubinemia - Resolved - CBC with Differential/Platelet; Future - Basic Metabolic Panel; Future - CBC with Differential/Platelet - Basic Metabolic Panel  5. Paroxysmal atrial fibrillation (HCC) - Continue Eliquis - CBC with Differential/Platelet; Future - Basic Metabolic Panel; Future - CBC with Differential/Platelet - Basic Metabolic Panel  6. S/P TAVR (transcatheter aortic valve replacement)  - CBC with Differential/Platelet; Future - Basic Metabolic Panel; Future - CBC with Differential/Platelet - Basic Metabolic Panel  [name], NP

## 2020-09-20 NOTE — Chronic Care Management (AMB) (Signed)
  Chronic Care Management   Outreach Note  09/20/2020 Name: Breanna Webster MRN: [id number] DOB: 09/06/1945  Breanna Webster is a 75 y.o. year old female who is a primary care patient of [name], NP. I reached out to Breanna Webster by phone today in response to a referral sent by Breanna Webster's PCP, [name], NP.     A second unsuccessful telephone outreach was attempted today. The patient was referred to the case management team for assistance with care management and care coordination.   Follow Up Plan: The care management team will reach out to the patient again over the next 7 days. If patient returns call to provider office, please advise to call [facility] at [phone].  [facility]

## 2020-09-22 ENCOUNTER — Encounter: Payer: Self-pay | Admitting: Adult Health

## 2020-09-22 NOTE — Telephone Encounter (Signed)
FYI

## 2020-09-23 ENCOUNTER — Encounter: Payer: Self-pay | Admitting: Adult Health

## 2020-09-23 DIAGNOSIS — R350 Frequency of micturition: Secondary | ICD-10-CM

## 2020-09-23 NOTE — Telephone Encounter (Signed)
Please advise 

## 2020-09-25 ENCOUNTER — Other Ambulatory Visit: Payer: Self-pay | Admitting: Adult Health

## 2020-09-25 DIAGNOSIS — Z760 Encounter for issue of repeat prescription: Secondary | ICD-10-CM

## 2020-09-26 ENCOUNTER — Encounter: Payer: Self-pay | Admitting: Adult Health

## 2020-09-26 NOTE — Telephone Encounter (Signed)
Please advise tried to see if we can help on our end but pt has not returned call. Referral was placed already

## 2020-09-27 ENCOUNTER — Other Ambulatory Visit: Payer: Self-pay | Admitting: Adult Health

## 2020-09-27 ENCOUNTER — Encounter: Payer: Self-pay | Admitting: Adult Health

## 2020-09-27 DIAGNOSIS — Z760 Encounter for issue of repeat prescription: Secondary | ICD-10-CM

## 2020-09-28 NOTE — Chronic Care Management (AMB) (Signed)
  Chronic Care Management   Note  09/28/2020 Name: Breanna Webster MRN: [id number] DOB: 27-Feb-1946  Breanna Webster is a 75 y.o. year old female who is a primary care patient of [name], NP. I reached out to [name] by phone today in response to a referral sent by Breanna Webster's PCP, [name], NP.     Breanna Webster was given information about Chronic Care Management services today including:  1. CCM service includes personalized support from designated clinical staff supervised by her physician, including individualized plan of care and coordination with other care providers 2. 24/7 contact phone numbers for assistance for urgent and routine care needs. 3. Service will only be billed when office clinical staff spend 20 minutes or more in a month to coordinate care. 4. Only one practitioner may furnish and bill the service in a calendar month. 5. The patient may stop CCM services at any time (effective at the end of the month) by phone call to the office staff. 6. The patient will be responsible for cost sharing (co-pay) of up to 20% of the service fee (after annual deductible is met).  Patient agreed to services and verbal consent obtained.   Follow up plan: Telephone appointment with care management team member scheduled for:10/03/2020  [place] [facility]

## 2020-10-03 ENCOUNTER — Ambulatory Visit (INDEPENDENT_AMBULATORY_CARE_PROVIDER_SITE_OTHER): Payer: Medicare Other

## 2020-10-03 DIAGNOSIS — E0865 Diabetes mellitus due to underlying condition with hyperglycemia: Secondary | ICD-10-CM | POA: Diagnosis not present

## 2020-10-03 DIAGNOSIS — I2510 Atherosclerotic heart disease of native coronary artery without angina pectoris: Secondary | ICD-10-CM

## 2020-10-03 DIAGNOSIS — I480 Paroxysmal atrial fibrillation: Secondary | ICD-10-CM | POA: Diagnosis not present

## 2020-10-03 DIAGNOSIS — I10 Essential (primary) hypertension: Secondary | ICD-10-CM | POA: Diagnosis not present

## 2020-10-03 DIAGNOSIS — Z8673 Personal history of transient ischemic attack (TIA), and cerebral infarction without residual deficits: Secondary | ICD-10-CM

## 2020-10-03 NOTE — Chronic Care Management (AMB) (Signed)
Chronic Care Management   CCM RN Visit Note  10/03/2020 Name: Breanna Webster MRN: [id number] DOB: 1945-09-04  Subjective: Breanna Webster is a 75 y.o. year old female who is a primary care patient of [name], NP. The care management team was consulted for assistance with disease management and care coordination needs.    Engaged with patient by telephone for initial visit in response to provider referral for case management and/or care coordination services.   Consent to Services:  The patient was given the following information about Chronic Care Management services today, agreed to services, and gave verbal consent: 1. CCM service includes personalized support from designated clinical staff supervised by the primary care provider, including individualized plan of care and coordination with other care providers 2. 24/7 contact phone numbers for assistance for urgent and routine care needs. 3. Service will only be billed when office clinical staff spend 20 minutes or more in a month to coordinate care. 4. Only one practitioner may furnish and bill the service in a calendar month. 5.The patient may stop CCM services at any time (effective at the end of the month) by phone call to the office staff. 6. The patient will be responsible for cost sharing (co-pay) of up to 20% of the service fee (after annual deductible is met). Patient agreed to services and consent obtained.  Patient agreed to services and verbal consent obtained.   Assessment: Review of patient past medical history, allergies, medications, health status, including review of consultants reports, laboratory and other test data, was performed as part of comprehensive evaluation and provision of chronic care management services.   SDOH (Social Determinants of Health) assessments and interventions performed:  SDOH Interventions    Flowsheet Row Most Recent Value  SDOH Interventions   Food Insecurity Interventions Intervention  Not Indicated  Housing Interventions Intervention Not Indicated  Stress Interventions Patient Refused  [Declines LCSW referral]  Transportation Interventions Other (Comment)  [Care to give resources if needs change]        CCM Care Plan  Allergies  Allergen Reactions   Statins Other (See Comments)    Muscle aches    Outpatient Encounter Medications as of 10/03/2020  Medication Sig   losartan (COZAAR) 50 MG tablet Take 50 mg by mouth daily.   BD INSULIN SYRINGE U/F 31G X 5/16" 1 ML MISC FOUR TIMES A DAY UNDER THE SKIN   cholecalciferol (VITAMIN D) 25 MCG (1000 UNIT) tablet Take 1,000 Units by mouth daily.   diltiazem (CARDIZEM CD) 120 MG 24 hr capsule TAKE ONE CAPSULE BY MOUTH DAILY   ELIQUIS 5 MG TABS tablet TAKE ONE TABLET BY MOUTH TWICE A DAY   escitalopram (LEXAPRO) 20 MG tablet Take 20 mg by mouth daily.   Lancets (ONETOUCH DELICA PLUS [id number]) MISC USE TO CHECK BLOOD SUGAR TWICE DAILY..   metFORMIN (GLUCOPHAGE-XR) 500 MG 24 hr tablet Take 500 mg by mouth 2 (two) times daily.   Multiple Vitamin (MULITIVITAMIN WITH MINERALS) TABS Take 1 tablet by mouth daily.   nitroGLYCERIN (NITROSTAT) 0.4 MG SL tablet Place 1 tablet (0.4 mg total) under the tongue every 5 (five) minutes as needed for chest pain.   NOVOLIN N 100 UNIT/ML injection Inject 35 Units into the skin daily before breakfast.   NOVOLIN R 100 UNIT/ML injection 10 UNITS THIRTY MINUTES BEFORE MEALS.  5 ADDITIONAL UNITS WITH CARBS OR SNACKS.   ONETOUCH VERIO test strip USE TO MONITOR GLUCOSE LEVELS TWICE DAILY   SYNTHROID 137 MCG tablet  Take 137 mcg by mouth daily.   No facility-administered encounter medications on file as of 10/03/2020.    Patient Active Problem List   Diagnosis Date Noted   Sepsis secondary to UTI ([place]) 09/08/2020   Elevated ALT measurement 09/08/2020   History of CVA with residual deficit 09/08/2020   Hyperbilirubinemia 09/08/2020   Fatigue associated with anemia 08/03/2020   Cerebrovascular  accident (CVA) due to embolism of right posterior cerebral artery ([place]) 08/03/2020   OSA treated with BiPAP 08/03/2020   Complex sleep apnea syndrome 08/03/2020   Treatment-emergent central sleep apnea 08/03/2020   Chronic intermittent hypoxia with obstructive sleep apnea 04/21/2020   OSA (obstructive sleep apnea) 04/21/2020   History of cardioembolic stroke 97/28/2060   Gait disturbance, post-stroke 03/29/2020   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 ([place]) 03/29/2020   Anxiety    RLQ abdominal pain 10/22/2019   Paroxysmal atrial fibrillation ([place]) 05/22/2019   Iron deficiency anemia 05/07/2019   Atrial fibrillation with RVR ([place]) 10/21/2018   Cerebellar stroke, acute ([place]) 10/21/2018   Streptococcal endocarditis    Endocarditis of mitral valve 10/07/2018   Bacteremia due to Streptococcus Salivarius 10/07/2018   Sepsis ([place]) 15/61/5379   Acute metabolic encephalopathy 43/27/6147   Severe aortic stenosis 07/08/2018   S/P TAVR (transcatheter aortic valve replacement) 07/08/2018   Esophageal thickening    CAD in native artery 04/22/2018   Gastroesophageal reflux disease    Pulmonary hypertension ([place]) 02/21/2018   Essential hypertension 07/15/2017   History of colonic polyps 05/22/2017   Elevated liver function tests 12/05/2016   DOE (dyspnea on exertion) 07/19/2016   Thalassemia minor 05/29/2016   Left bundle branch block 12/06/2015   Upper airway cough syndrome 10/14/2015   Myalgia 02/17/2014   Carotid artery stenosis 06/05/2013   Eustachian tube dysfunction 05/07/2013   Neuropathy of leg 03/07/2012   Anemia, unspecified 01/31/2012   Hot flashes 08/24/2010   Diabetes mellitus due to underlying condition, uncontrolled ([place]) 06/30/2010   Generalized abdominal pain 03/24/2010   Obesity (BMI 30.0-34.9) 12/21/2009   IBS (irritable bowel syndrome) 04/29/2009   Vitamin D deficiency 03/10/2009   Hypothyroidism 12/13/2008   Dyslipidemia 12/13/2008   Anxiety state  12/13/2008   Other specified disorders of bladder 12/13/2008    Conditions to be addressed/monitored:Atrial Fibrillation, CAD, HTN, DMII, and urinary incontinence, hx CVA  Care Plan : RNCM:Diabetes Type 2 (Adult)  Updates made by [name], RN since 10/03/2020 12:00 AM     Problem: Lack of Long Term plan for self managment of Type 2 DM   Priority: Medium     Long-Range Goal: Effective long term self management of Type 2 DM   Start Date: 10/03/2020  Expected End Date: 03/23/2021  This Visit's Progress: On track  Priority: Medium  Note:   Objective:  Lab Results  Component Value Date   HGBA1C 7.4 (H) 09/08/2020   Lab Results  Component Value Date   CREATININE 0.67 09/16/2020   CREATININE 0.74 09/11/2020   CREATININE 0.77 09/10/2020   No results found for: EGFR Current Barriers:  Knowledge Deficits related to basic Diabetes pathophysiology and self care/management Knowledge Deficits related to medications used for management of diabetes Unable to independently self manage Type 2 DM Unable to perform IADLs independently-Husband assists with transportation  Pt states that she has not been eating as well since she was sick and in the hospital with her UTI.  States her CBGs have been lower and she has been in contact with her endocrinologist  as needed. CBG's range from 57-155.  States she is trying to watch the CHO and salt in her diet.  States she is drinking Glucerna if she does not feel like eating sometimes.  States she has not been as active since her bladder problems started.  States she has been more stressed due to her illness but thinks she is doing OK handling at this time.  States she is worried about transportation if her husband becomes unable to drive them. Case Manager Clinical Goal(s):  patient will demonstrate improved adherence to prescribed treatment plan for diabetes self care/management as evidenced by: daily monitoring and recording of CBG  adherence to  ADA/ carb modified diet adherence to prescribed medication regimen contacting provider for new or worsened symptoms or questions Interventions:  Collaboration with Carlisle Cater, Tommi Rumps, NP regarding development and update of comprehensive plan of care as evidenced by provider attestation and co-signature Inter-disciplinary care team collaboration (see longitudinal plan of care) Provided education to patient about basic DM disease process Reviewed medications with patient and discussed importance of medication adherence Discussed plans with patient for ongoing care management follow up and provided patient with direct contact information for care management team Provided patient with written educational materials related to hypo and hyperglycemia and importance of correct treatment Reviewed scheduled/upcoming provider appointments including: no upcoming with primary care, Urology 10/17/20, Neurology 12/07/20 Advised patient, providing education and rationale, to check cbg before meals and record, calling endocrinologist or primary care provider   for findings outside established parameters.   Referral made to community resources care guide team for assistance with potential transportation issues if husband is unable to drive Review of patient status, including review of consultants reports, relevant laboratory and other test results, and medications completed. Declines LCSW referral for stress management of her chronic illnesses  Self-Care Activities - Self administers oral medications as prescribed Self administers insulin as prescribed Attends all scheduled provider appointments Checks blood sugars as prescribed and utilize hyper and hypoglycemia protocol as needed Adheres to prescribed ADA/carb modified Patient Goals: - check blood sugar at prescribed times - check blood sugar if I feel it is too high or too low - enter blood sugar readings and medication or insulin into daily log - take the blood  sugar log to all doctor visits - change to whole grain breads, cereal, pasta - drink 6 to 8 glasses of water each day - fill half of plate with vegetables - manage portion size - read food labels for fat, fiber, carbohydrates and portion size - switch to sugar-free drinks - keep appointment with eye doctor - check feet daily for cuts, sores or redness Follow Up Plan: Telephone follow up appointment with care management team member scheduled for: 11/01/20 at 10 AM The patient has been provided with contact information for the care management team and has been advised to call with any health related questions or concerns.      Care Plan : RNCM:Urinary Incontinence (Adult)  Updates made by [name], RN since 10/03/2020 12:00 AM     Problem: Symptom Management (Urinary Incontinence)   Priority: High     Long-Range Goal: Urinary Incontinence Symptoms Manged   Start Date: 10/03/2020  Expected End Date: 03/23/2021  This Visit's Progress: On track  Priority: High  Note:   Current Barriers:  Ineffective Self Health Maintenance of urinary incontinence with hx of UTI, DM2, CAD, hx of CVA, atrial fib,  anemia Unable to independently self manage urinary incontinence Unable to perform  IADLs independently-husband provides transportation to appointments States that since she had the hospitalization for sepsis/UTI she has been having frequent urination during the day, denies dysuria.  States she is to see Dr. [name] on 6/27/2 because it was going to be November before she could see Dr. Amalia Hailey Clinical Goal(s):  Collaboration with Carlisle Cater, Tommi Rumps, NP regarding development and update of comprehensive plan of care as evidenced by provider attestation and co-signature Inter-disciplinary care team collaboration (see longitudinal plan of care) patient will work with care management team to address care coordination and chronic disease management needs related to Disease Management Educational  Needs Care Coordination   Interventions:  Evaluation of current treatment plan related to CAD, HTN, HLD, DMII, Anxiety, and hx CVA, atrial fib , Transportation, ADL IADL limitations, and Inability to perform IADL's independently self-management and patient's adherence to plan as established by provider. Collaboration with [name], NP regarding development and update of comprehensive plan of care as evidenced by provider attestation       and co-signature Inter-disciplinary care team collaboration (see longitudinal plan of care) Discussed plans with patient for ongoing care management follow up and provided patient with direct contact information for care management team Instructed on s/sx UTI and when to call MD Reviewed to keep perineal area clean and dry Reviewed to keep urology appointment on 10/17/20 with Dr. [name] and encourgaed to discuss treatment options for incontinence and overactive bladder Self Care Activities:  Patient verbalizes understanding of plan to self manage urinary incontinence Self administers medications as prescribed Attends all scheduled provider appointments Calls pharmacy for medication refills Calls provider office for new concerns or questions Patient Goals: - clean and dry skin well - keep skin dry - use a fragrance-free lotion on skin - wear a protective pad or garment Follow Up Plan: Telephone follow up appointment with care management team member scheduled for: 11/01/20 at 10 AM The patient has been provided with contact information for the care management team and has been advised to call with any health related questions or concerns.      Care Plan : RNCM:Cardiovascular Disease Management(HTN, CAD, Atrial fibrillation)  Updates made by [name], RN since 10/03/2020 12:00 AM     Problem: Lack of long term management of Cardiovascular Disease(HTN, CAD, Atrial fibrillation   Priority: Medium     Long-Range Goal: Effective  Cardiovascular Disease Self  Management(HTN, CAD, Atrial fibrillation)   Start Date: 10/03/2020  Expected End Date: 03/23/2021  This Visit's Progress: On track  Priority: Medium  Note:   Current Barriers:  Knowledge deficits related to self health management of Cardiovascular Disease Management(HTN, CAD, Atrial fibrillation)  Knowledge Deficits related to Self management of Cardiovascular Disease Management(HTN, CAD, Atrial fibrillation Care Coordination needs related to disease management  in a patient with Self management of Cardiovascular Disease Management(HTN, CAD, Atrial fibrillation Chronic Disease Management support and education needs related to Self management of Cardiovascular Disease Management(HTN, CAD, Atrial fibrillation Unable to independently Self management of Cardiovascular Disease Management(HTN, CAD, Atrial fibrillation Unable to perform IADLs independently Nurse Case Manager Clinical Goal(s):  patient will take all medications exactly as prescribed and will call provider for medication related questions patient will verbalize understanding of Afib Action Plan and when to call doctor patient will verbalize understanding of plan for Self management of Cardiovascular Disease Management(HTN, CAD, Atrial fibrillation patient will meet with RN Care Manager to address Self management of Cardiovascular Disease Management(HTN, CAD, Atrial fibrillation patient will take all medications exactly as prescribed  and will call provider for medication related questions patient will attend all scheduled medical appointments: no upcoming primary care provider scheduled, urology 10/17/20, neurology 12/07/20 patient will verbalize basic understanding of Self management of Cardiovascular Disease Management(HTN, CAD, Atrial fibrillation disease process and self health management plan as evidenced by verbalizing understanding of education, voiced adherence to treatment plan the patient will  demonstrate ongoing self health care management ability as evidenced by voiced understanding of treatment plan and decrease in hospitalizations * Interventions:  Collaboration with [name], NP regarding development and update of comprehensive plan of care as evidenced by provider attestation and co-signature Inter-disciplinary care team collaboration (see longitudinal plan of care) Basic overview and discussion of HTN, CAD, Atrial fibrillation Medications reviewed Afib action plan reviewed Evaluation of current treatment plan related to Self management of Cardiovascular Disease Management(HTN, CAD, Atrial fibrillation and patient's adherence to plan as established by provider. Provided education to patient re: HTN, CAD, Atrial fibrillation Reviewed medications with patient and discussed adherence Advised patient, providing education and rationale, to monitor blood pressure daily and record, calling provider for findings outside established parameters.  Care Guide referral for potential transportation issues if husband is unable to provide Discussed plans with patient for ongoing care management follow up and provided patient with direct contact information for care management team Self-Care Activities: Self administers medications as prescribed Attends all scheduled provider appointments Calls pharmacy for medication refills Calls provider office for new concerns or questions Patient Goals: - begin a symptom diary - make a plan to eat healthy - keep all lab appointments - take medicine as prescribed - check blood pressure 3 times per week - choose a place to take my blood pressure (home, clinic or office, retail store) - write blood pressure results in a log or diary Follow Up Plan: Telephone follow up appointment with care management team member scheduled for: 11/01/20 at 10 AM The patient has been provided with contact information for the care management team and has been advised  to call with any health related questions or concerns.       Plan:Telephone follow up appointment with care management team member scheduled for:  11/01/20 and The patient has been provided with contact information for the care management team and has been advised to call with any health related questions or concerns.  Peter Garter RN, Jackquline Denmark, CDE Care Management Coordinator  [facility] [phone], Mobile [phone]

## 2020-10-03 NOTE — Patient Instructions (Addendum)
Visit Information   PATIENT GOALS:   Goals Addressed             This Visit's Progress    RNCM:Keep Skin Clean and Dry   On track    Timeframe:  Long-Range Goal Priority:  High Start Date:   10/03/20                          Expected End Date:  03/23/21                     Follow Up Date 11/01/20    - clean and dry skin well - keep skin dry - use a fragrance-free lotion on skin - wear a protective pad or garment    Why is this important?   Leaking urine (pee) can cause soreness from skin rashes and redness.  This is caused by skin being exposed to urine (pee).    Notes:       RNCM:Monitor and Manage My Blood Sugar   On track    Timeframe:  Long-Range Goal Priority:  Medium Start Date: 10/03/20                            Expected End Date: 03/23/21                       Follow Up Date 11/01/20   - check blood sugar at prescribed times - check blood sugar if I feel it is too high or too low - enter blood sugar readings and medication or insulin into daily log - take the blood sugar log to all doctor visits - take the blood sugar meter to all doctor visits    Why is this important?   Checking your blood sugar at home helps to keep it from getting very high or very low.  Writing the results in a diary or log helps the doctor know how to care for you.  Your blood sugar log should have the time, date and the results.  Also, write down the amount of insulin or other medicine that you take.  Other information, like what you ate, exercise done and how you were feeling, will also be helpful.     Notes:       RNCM:Track and Manage My Blood Pressure-Hypertension   On track    Timeframe:  Long-Range Goal Priority:  Medium Start Date:      10/03/20                       Expected End Date:  03/23/21                    Follow Up Date 11/01/20    - check blood pressure 3 times per week - choose a place to take my blood pressure (home, clinic or office, retail store) - write blood  pressure results in a log or diary    Why is this important?   You won't feel high blood pressure, but it can still hurt your blood vessels.  High blood pressure can cause heart or kidney problems. It can also cause a stroke.  Making lifestyle changes like losing a little weight or eating less salt will help.  Checking your blood pressure at home and at different times of the day can help to control blood pressure.  If  the doctor prescribes medicine remember to take it the way the doctor ordered.  Call the office if you cannot afford the medicine or if there are questions about it.     Notes:        Urinary Frequency, Adult Urinary frequency means urinating more often than usual. You may urinate every 1-2 hours even though you drink a normal amount of fluid and do not have a bladder infection or condition. Although you urinate more often than normal,the total amount of urine produced in a day is normal. With urinary frequency, you may have an urgent need to urinate often. The stress and anxiety of needing to find a bathroom quickly can make this urge worse. This condition may go away on its own or you may need treatment at home. Home treatment may include bladder training, exercises, taking medicines, ormaking changes to your diet. Follow these instructions at home: Bladder health  Keep a bladder diary if told by your health care provider. Keep track of: What you eat and drink. How often you urinate. How much you urinate. Follow a bladder training program if told by your health care provider. This may include: Learning to delay going to the bathroom. Double urinating (voiding). This helps if you are not completely emptying your bladder. Scheduled voiding. Do Kegel exercises as told by your health care provider. Kegel exercises strengthen the muscles that help control urination, which may help the condition.  Eating and drinking If told by your health care provider, make diet changes,  such as: Avoiding caffeine. Drinking fewer fluids, especially alcohol. Not drinking in the evening. Avoiding foods or drinks that may irritate the bladder. These include coffee, tea, soda, artificial sweeteners, citrus, tomato-based foods, and chocolate. Eating foods that help prevent or ease constipation. Constipation can make this condition worse. Your health care provider may recommend that you: Drink enough fluid to keep your urine pale yellow. Take over-the-counter or prescription medicines. Eat foods that are high in fiber, such as beans, whole grains, and fresh fruits and vegetables. Limit foods that are high in fat and processed sugars, such as fried or sweet foods. General instructions Take over-the-counter and prescription medicines only as told by your health care provider. Keep all follow-up visits as told by your health care provider. This is important. Contact a health care provider if: You start urinating more often. You feel pain or irritation when you urinate. You notice blood in your urine. Your urine looks cloudy. You develop a fever. You begin vomiting. Get help right away if: You are unable to urinate. Summary Urinary frequency means urinating more often than usual. With urinary frequency, you may urinate every 1-2 hours even though you drink a normal amount of fluid and do not have a bladder infection or other bladder condition. Your health care provider may recommend that you keep a bladder diary, follow a bladder training program, or make dietary changes. If told by your health care provider, do Kegel exercises to strengthen the muscles that help control urination. Take over-the-counter and prescription medicines only as told by your health care provider. Contact a health care provider if your symptoms do not improve or get worse. This information is not intended to replace advice given to you by your health care provider. Make sure you discuss any questions you  have with your healthcare provider. Document Revised: 10/17/2017 Document Reviewed: 10/17/2017 Elsevier Patient Education  2021 [employer].   Consent to CCM Services: Breanna Webster was given information about Chronic  Care Management services today including:  CCM service includes personalized support from designated clinical staff supervised by her physician, including individualized plan of care and coordination with other care providers 24/7 contact phone numbers for assistance for urgent and routine care needs. Service will only be billed when office clinical staff spend 20 minutes or more in a month to coordinate care. Only one practitioner may furnish and bill the service in a calendar month. The patient may stop CCM services at any time (effective at the end of the month) by phone call to the office staff. The patient will be responsible for cost sharing (co-pay) of up to 20% of the service fee (after annual deductible is met).  Patient agreed to services and verbal consent obtained.   Patient verbalizes understanding of instructions provided today and agrees to view in [place].   Telephone follow up appointment with care management team member scheduled for: 11/01/20 at 36 AM  [place], [facility], CDE Care Management Coordinator [facility] [phone], Mobile [phone]   CLINICAL CARE PLAN: Patient Care Plan: RNCM:Diabetes Type 2 (Adult)     Problem Identified: Lack of Long Term plan for self managment of Type 2 DM   Priority: Medium     Long-Range Goal: Effective long term self management of Type 2 DM   Start Date: 10/03/2020  Expected End Date: 03/23/2021  This Visit's Progress: On track  Priority: Medium  Note:   Objective:  Lab Results  Component Value Date   HGBA1C 7.4 (H) 09/08/2020   Lab Results  Component Value Date   CREATININE 0.67 09/16/2020   CREATININE 0.74 09/11/2020   CREATININE 0.77 09/10/2020   No results  found for: EGFR Current Barriers:  Knowledge Deficits related to basic Diabetes pathophysiology and self care/management Knowledge Deficits related to medications used for management of diabetes Unable to independently self manage Type 2 DM Unable to perform IADLs independently-Husband assists with transportation  Pt states that she has not been eating as well since she was sick and in the hospital with her UTI.  States her CBGs have been lower and she has been in contact with her endocrinologist as needed. CBG's range from 57-155.  States she is trying to watch the CHO and salt in her diet.  States she is drinking Glucerna if she does not feel like eating sometimes.  States she has not been as active since her bladder problems started.  States she has been more stressed due to her illness but thinks she is doing OK handling at this time.  States she is worried about transportation if her husband becomes unable to drive them. Case Manager Clinical Goal(s):  patient will demonstrate improved adherence to prescribed treatment plan for diabetes self care/management as evidenced by: daily monitoring and recording of CBG  adherence to ADA/ carb modified diet adherence to prescribed medication regimen contacting provider for new or worsened symptoms or questions Interventions:  Collaboration with Carlisle Cater, Tommi Rumps, NP regarding development and update of comprehensive plan of care as evidenced by provider attestation and co-signature Inter-disciplinary care team collaboration (see longitudinal plan of care) Provided education to patient about basic DM disease process Reviewed medications with patient and discussed importance of medication adherence Discussed plans with patient for ongoing care management follow up and provided patient with direct contact information for care management team Provided patient with written educational materials related to hypo and hyperglycemia and importance of correct  treatment Reviewed scheduled/upcoming provider appointments including: no upcoming with primary  care, Urology 10/17/20, Neurology 12/07/20 Advised patient, providing education and rationale, to check cbg before meals and record, calling endocrinologist or primary care provider   for findings outside established parameters.   Referral made to community resources care guide team for assistance with potential transportation issues if husband is unable to drive Review of patient status, including review of consultants reports, relevant laboratory and other test results, and medications completed. Declines LCSW referral for stress management of her chronic illnesses  Self-Care Activities - Self administers oral medications as prescribed Self administers insulin as prescribed Attends all scheduled provider appointments Checks blood sugars as prescribed and utilize hyper and hypoglycemia protocol as needed Adheres to prescribed ADA/carb modified Patient Goals: - check blood sugar at prescribed times - check blood sugar if I feel it is too high or too low - enter blood sugar readings and medication or insulin into daily log - take the blood sugar log to all doctor visits - change to whole grain breads, cereal, pasta - drink 6 to 8 glasses of water each day - fill half of plate with vegetables - manage portion size - read food labels for fat, fiber, carbohydrates and portion size - switch to sugar-free drinks - keep appointment with eye doctor - check feet daily for cuts, sores or redness Follow Up Plan: Telephone follow up appointment with care management team member scheduled for: 11/01/20 at 10 AM The patient has been provided with contact information for the care management team and has been advised to call with any health related questions or concerns.      Patient Care Plan: RNCM:Urinary Incontinence (Adult)     Problem Identified: Symptom Management (Urinary Incontinence)   Priority: High      Long-Range Goal: Urinary Incontinence Symptoms Manged   Start Date: 10/03/2020  Expected End Date: 03/23/2021  This Visit's Progress: On track  Priority: High  Note:   Current Barriers:  Ineffective Self Health Maintenance of urinary incontinence with hx of UTI, DM2, CAD, hx of CVA, atrial fib,  anemia Unable to independently self manage urinary incontinence Unable to perform IADLs independently-husband provides transportation to appointments States that since she had the hospitalization for sepsis/UTI she has been having frequent urination during the day, denies dysuria.  States she is to see Dr. [name] on 6/27/2 because it was going to be November before she could see Dr. Amalia Hailey Clinical Goal(s):  Collaboration with Carlisle Cater, Tommi Rumps, NP regarding development and update of comprehensive plan of care as evidenced by provider attestation and co-signature Inter-disciplinary care team collaboration (see longitudinal plan of care) patient will work with care management team to address care coordination and chronic disease management needs related to Disease Management Educational Needs Care Coordination   Interventions:  Evaluation of current treatment plan related to CAD, HTN, HLD, DMII, Anxiety, and hx CVA, atrial fib , Transportation, ADL IADL limitations, and Inability to perform IADL's independently self-management and patient's adherence to plan as established by provider. Collaboration with [name], NP regarding development and update of comprehensive plan of care as evidenced by provider attestation       and co-signature Inter-disciplinary care team collaboration (see longitudinal plan of care) Discussed plans with patient for ongoing care management follow up and provided patient with direct contact information for care management team Instructed on s/sx UTI and when to call MD Reviewed to keep perineal area clean and dry Reviewed to keep urology appointment on 10/17/20 with  Dr. [name] and encourgaed to discuss treatment options for  incontinence and overactive bladder Self Care Activities:  Patient verbalizes understanding of plan to self manage urinary incontinence Self administers medications as prescribed Attends all scheduled provider appointments Calls pharmacy for medication refills Calls provider office for new concerns or questions Patient Goals: - clean and dry skin well - keep skin dry - use a fragrance-free lotion on skin - wear a protective pad or garment Follow Up Plan: Telephone follow up appointment with care management team member scheduled for: 11/01/20 at 10 AM The patient has been provided with contact information for the care management team and has been advised to call with any health related questions or concerns.      Patient Care Plan: RNCM:Cardiovascular Disease Management(HTN, CAD, Atrial fibrillation)     Problem Identified: Lack of long term management of Cardiovascular Disease(HTN, CAD, Atrial fibrillation   Priority: Medium     Long-Range Goal: Effective Cardiovascular Disease Self  Management(HTN, CAD, Atrial fibrillation)   Start Date: 10/03/2020  Expected End Date: 03/23/2021  This Visit's Progress: On track  Priority: Medium  Note:   Current Barriers:  Knowledge deficits related to self health management of Cardiovascular Disease Management(HTN, CAD, Atrial fibrillation)  Knowledge Deficits related to Self management of Cardiovascular Disease Management(HTN, CAD, Atrial fibrillation Care Coordination needs related to disease management  in a patient with Self management of Cardiovascular Disease Management(HTN, CAD, Atrial fibrillation Chronic Disease Management support and education needs related to Self management of Cardiovascular Disease Management(HTN, CAD, Atrial fibrillation Unable to independently Self management of Cardiovascular Disease Management(HTN, CAD, Atrial fibrillation Unable to perform IADLs  independently Nurse Case Manager Clinical Goal(s):  patient will take all medications exactly as prescribed and will call provider for medication related questions patient will verbalize understanding of Afib Action Plan and when to call doctor patient will verbalize understanding of plan for Self management of Cardiovascular Disease Management(HTN, CAD, Atrial fibrillation patient will meet with RN Care Manager to address Self management of Cardiovascular Disease Management(HTN, CAD, Atrial fibrillation patient will take all medications exactly as prescribed and will call provider for medication related questions patient will attend all scheduled medical appointments: no upcoming primary care provider scheduled, urology 10/17/20, neurology 12/07/20 patient will verbalize basic understanding of Self management of Cardiovascular Disease Management(HTN, CAD, Atrial fibrillation disease process and self health management plan as evidenced by verbalizing understanding of education, voiced adherence to treatment plan the patient will demonstrate ongoing self health care management ability as evidenced by voiced understanding of treatment plan and decrease in hospitalizations * Interventions:  Collaboration with [name], NP regarding development and update of comprehensive plan of care as evidenced by provider attestation and co-signature Inter-disciplinary care team collaboration (see longitudinal plan of care) Basic overview and discussion of HTN, CAD, Atrial fibrillation Medications reviewed Afib action plan reviewed Evaluation of current treatment plan related to Self management of Cardiovascular Disease Management(HTN, CAD, Atrial fibrillation and patient's adherence to plan as established by provider. Provided education to patient re: HTN, CAD, Atrial fibrillation Reviewed medications with patient and discussed adherence Advised patient, providing education and rationale, to monitor blood  pressure daily and record, calling provider for findings outside established parameters.  Care Guide referral for potential transportation issues if husband is unable to provide Discussed plans with patient for ongoing care management follow up and provided patient with direct contact information for care management team Self-Care Activities: Self administers medications as prescribed Attends all scheduled provider appointments Calls pharmacy for medication refills Calls provider office for new concerns or questions  Patient Goals: - begin a symptom diary - make a plan to eat healthy - keep all lab appointments - take medicine as prescribed - check blood pressure 3 times per week - choose a place to take my blood pressure (home, clinic or office, retail store) - write blood pressure results in a log or diary Follow Up Plan: Telephone follow up appointment with care management team member scheduled for: 11/01/20 at 10 AM The patient has been provided with contact information for the care management team and has been advised to call with any health related questions or concerns.

## 2020-10-04 ENCOUNTER — Encounter: Payer: Self-pay | Admitting: Hematology

## 2020-10-05 NOTE — Telephone Encounter (Signed)
   Telephone encounter was:  Unsuccessful.  10/05/2020 Name: Breanna Webster MRN: [id number] DOB: 1945/06/07  Unsuccessful outbound call made today to assist with:  Transportation Needs   Outreach Attempt:  1st Attempt  A HIPAA compliant voice message was left requesting a return call.  Instructed patient to call back at [phone].  April Green Care Guide, Embedded Care Coordination [place], Care Management Phone: [phone] Email: [email or web address]

## 2020-10-06 ENCOUNTER — Encounter: Payer: Self-pay | Admitting: Hematology

## 2020-10-07 ENCOUNTER — Telehealth: Payer: Self-pay | Admitting: Adult Health

## 2020-10-07 NOTE — Telephone Encounter (Signed)
   Telephone encounter was:  Unsuccessful.  10/07/2020 Name: Breanna Webster MRN: [id number] DOB: 02-Feb-1946  Unsuccessful outbound call made today to assist with:  Transportation Needs   Outreach Attempt:  2nd Attempt  A HIPAA compliant voice message was left requesting a return call.  Instructed patient to call back at [phone].  April Green Care Guide, Embedded Care Coordination [place], Care Management Phone: [phone] Email: [email or web address]

## 2020-10-11 ENCOUNTER — Encounter: Payer: Self-pay | Admitting: Hematology

## 2020-10-12 ENCOUNTER — Telehealth: Payer: Self-pay | Admitting: Adult Health

## 2020-10-12 NOTE — Telephone Encounter (Signed)
   Telephone encounter was:  Unsuccessful.  10/12/2020 Name: Breanna Webster MRN: [id number] DOB: [birth year]  Unsuccessful outbound call made today to assist with:  Transportation Needs   Outreach Attempt:  3rd Attempt.  Referral closed unable to contact patient.  A HIPAA compliant voice message was left requesting a return call.  Instructed patient to call back at [phone].  April Green Care Guide, Embedded Care Coordination [place], Care Management Phone: [phone] Email: [email or web address]

## 2020-10-17 DIAGNOSIS — N3000 Acute cystitis without hematuria: Secondary | ICD-10-CM | POA: Diagnosis not present

## 2020-10-17 DIAGNOSIS — R8271 Bacteriuria: Secondary | ICD-10-CM | POA: Diagnosis not present

## 2020-10-17 DIAGNOSIS — R350 Frequency of micturition: Secondary | ICD-10-CM | POA: Diagnosis not present

## 2020-10-17 DIAGNOSIS — R3915 Urgency of urination: Secondary | ICD-10-CM | POA: Diagnosis not present

## 2020-10-21 DIAGNOSIS — M1711 Unilateral primary osteoarthritis, right knee: Secondary | ICD-10-CM | POA: Diagnosis not present

## 2020-10-22 DIAGNOSIS — Z20822 Contact with and (suspected) exposure to covid-19: Secondary | ICD-10-CM | POA: Diagnosis not present

## 2020-11-01 ENCOUNTER — Telehealth: Payer: Self-pay

## 2020-11-01 ENCOUNTER — Encounter: Payer: Self-pay | Admitting: Adult Health

## 2020-11-01 ENCOUNTER — Telehealth: Payer: BLUE CROSS/BLUE SHIELD

## 2020-11-01 NOTE — Telephone Encounter (Signed)
Please advise 

## 2020-11-01 NOTE — Telephone Encounter (Signed)
  Care Management   Follow Up Note   11/01/2020 Name: Breanna Webster MRN: [id number] DOB: 26-Dec-1945   Referred by: [name], NP Reason for referral : Chronic Care Management (RNCM: Follow up Outreach Chronic Care Management and coordination needs-unsuccessful)   An unsuccessful telephone outreach was attempted today. The patient was referred to the case management team for assistance with care management and care coordination.   Follow Up Plan: The care management team will reach out to the patient again over the next 30 days.   Peter Garter RN, Jackquline Denmark, CDE Care Management Coordinator [facility] [phone], Mobile [phone]

## 2020-11-03 DIAGNOSIS — Z23 Encounter for immunization: Secondary | ICD-10-CM | POA: Diagnosis not present

## 2020-11-04 ENCOUNTER — Telehealth: Payer: Self-pay | Admitting: *Deleted

## 2020-11-04 NOTE — Chronic Care Management (AMB) (Signed)
  Care Management   Note  11/04/2020 Name: Breanna Webster MRN: [id number] DOB: 16-Feb-1946  Breanna Webster is a 75 y.o. year old female who is a primary care patient of Nafziger, Tommi Rumps, NP and is actively engaged with the care management team. I reached out to [name] by phone today to assist with re-scheduling a follow up visit with the RN Case Manager  Follow up plan: Unsuccessful telephone outreach attempt made. A HIPAA compliant phone message was left for the patient providing contact information and requesting a return call.  The care management team will reach out to the patient again over the next 7 days.  If patient returns call to provider office, please advise to call [place] at [phone].  [place] Management

## 2020-11-04 NOTE — Chronic Care Management (AMB) (Signed)
  Care Management   Note  11/04/2020 Name: Breanna Webster MRN: [id number] DOB: [birth year]  Breanna Webster is a 75 y.o. year old female who is a primary care patient of Nafziger, Tommi Rumps, NP and is actively engaged with the care management team. I reached out to [name] by phone today to assist with re-scheduling a follow up visit with the RN Case Manager  Follow up plan: Telephone appointment with care management team member scheduled for:11/07/2020  Maye Parkinson  Care Guide, Embedded Care Coordination [place]  Care Management

## 2020-11-07 ENCOUNTER — Ambulatory Visit (INDEPENDENT_AMBULATORY_CARE_PROVIDER_SITE_OTHER): Payer: Medicare Other

## 2020-11-07 DIAGNOSIS — I10 Essential (primary) hypertension: Secondary | ICD-10-CM

## 2020-11-07 DIAGNOSIS — I480 Paroxysmal atrial fibrillation: Secondary | ICD-10-CM | POA: Diagnosis not present

## 2020-11-07 DIAGNOSIS — Z8673 Personal history of transient ischemic attack (TIA), and cerebral infarction without residual deficits: Secondary | ICD-10-CM

## 2020-11-07 DIAGNOSIS — I2510 Atherosclerotic heart disease of native coronary artery without angina pectoris: Secondary | ICD-10-CM

## 2020-11-07 DIAGNOSIS — E1169 Type 2 diabetes mellitus with other specified complication: Secondary | ICD-10-CM | POA: Diagnosis not present

## 2020-11-07 DIAGNOSIS — Z794 Long term (current) use of insulin: Secondary | ICD-10-CM | POA: Diagnosis not present

## 2020-11-07 DIAGNOSIS — R350 Frequency of micturition: Secondary | ICD-10-CM

## 2020-11-07 NOTE — Patient Instructions (Signed)
Visit Information  PATIENT GOALS:  Goals Addressed             This Visit's Progress    RNCM:Keep Skin Clean and Dry   On track    Timeframe:  Long-Range Goal Priority:  High Start Date:   10/03/20                          Expected End Date:  03/23/21                     Follow Up Date 12/19/20    - clean and dry skin well - keep skin dry - use a fragrance-free lotion on skin - wear a protective pad or garment    Why is this important?   Leaking urine (pee) can cause soreness from skin rashes and redness.  This is caused by skin being exposed to urine (pee).    Notes:      RNCM:Monitor and Manage My Blood Sugar   On track    Timeframe:  Long-Range Goal Priority:  Medium Start Date: 10/03/20                            Expected End Date: 03/23/21                       Follow Up Date 11/2920   - check blood sugar at prescribed times - check blood sugar if I feel it is too high or too low - enter blood sugar readings and medication or insulin into daily log - take the blood sugar log to all doctor visits - take the blood sugar meter to all doctor visits    Why is this important?   Checking your blood sugar at home helps to keep it from getting very high or very low.  Writing the results in a diary or log helps the doctor know how to care for you.  Your blood sugar log should have the time, date and the results.  Also, write down the amount of insulin or other medicine that you take.  Other information, like what you ate, exercise done and how you were feeling, will also be helpful.     Notes:      RNCM:Track and Manage My Blood Pressure-Hypertension   On track    Timeframe:  Long-Range Goal Priority:  Medium Start Date:      10/03/20                       Expected End Date:  03/23/21                    Follow Up Date 12/19/20    - check blood pressure 3 times per week - choose a place to take my blood pressure (home, clinic or office, retail store) - write blood  pressure results in a log or diary    Why is this important?   You won't feel high blood pressure, but it can still hurt your blood vessels.  High blood pressure can cause heart or kidney problems. It can also cause a stroke.  Making lifestyle changes like losing a little weight or eating less salt will help.  Checking your blood pressure at home and at different times of the day can help to control blood pressure.  If the doctor prescribes medicine  remember to take it the way the doctor ordered.  Call the office if you cannot afford the medicine or if there are questions about it.     Notes:       Overactive Bladder, Adult  Overactive bladder is a condition in which a person has a sudden and frequent need to urinate. A person might also leak urine if he or she cannot get to the bathroom fast enough (urinary incontinence). Sometimes, symptoms can interfere with work or social activities. What are the causes? Overactive bladder is associated with poor nerve signals between your bladder and your brain. Your bladder may get the signal to empty before it is full. You may also have very sensitive muscles that make your bladder squeeze too soon. This condition may also be caused by other factors, such as: Medical conditions: Urinary tract infection. Infection of nearby tissues. Prostate enlargement. Bladder stones, inflammation, or tumors. Diabetes. Muscle or nerve weakness, especially from these conditions: A spinal cord injury. Stroke. Multiple sclerosis. Parkinson's disease. Other causes: Surgery on the uterus or urethra. Drinking too much caffeine or alcohol. Certain medicines, especially those that eliminate extra fluid in the body (diuretics). Constipation. What increases the risk? You may be at greater risk for overactive bladder if you: Are an older adult. Smoke. Are going through menopause. Have prostate problems. Have a neurological disease, such as stroke, dementia,  Parkinson's disease, or multiple sclerosis (MS). Eat or drink alcohol, spicy food, caffeine, and other things that irritate the bladder. Are overweight or obese. What are the signs or symptoms? Symptoms of this condition include a sudden, strong urge to urinate. Other symptoms include: Leaking urine. Urinating 8 or more times a day. Waking up to urinate 2 or more times overnight. How is this diagnosed? This condition may be diagnosed based on: Your symptoms and medical history. A physical exam. Blood or urine tests to check for possible causes, such as infection. You may also need to see a health care provider who specializes in urinarytract problems. This is called a urologist. How is this treated? Treatment for overactive bladder depends on the cause of your condition and whether it is mild or severe. Treatment may include: Bladder training, such as: Learning to control the urge to urinate by following a schedule to urinate at regular intervals. Doing Kegel exercises to strengthen the pelvic floor muscles that support your bladder. Special devices, such as: Biofeedback. This uses sensors to help you become aware of your body's signals. Electrical stimulation. This uses electrodes placed inside the body (implanted) or outside the body. These electrodes send gentle pulses of electricity to strengthen the nerves or muscles that control the bladder. Women may use a plastic device, called a pessary, that fits into the vagina and supports the bladder. Medicines, such as: Antibiotics to treat bladder infection. Antispasmodics to stop the bladder from releasing urine at the wrong time. Tricyclic antidepressants to relax bladder muscles. Injections of botulinum toxin type A directly into the bladder tissue to relax bladder muscles. Surgery, such as: A device may be implanted to help manage the nerve signals that control urination. An electrode may be implanted to stimulate electrical signals  in the bladder. A procedure may be done to change the shape of the bladder. This is done only in very severe cases. Follow these instructions at home: Eating and drinking  Make diet or lifestyle changes recommended by your health care provider. These may include: Drinking fluids throughout the day and not only with meals. Cutting  down on caffeine or alcohol. Eating a healthy and balanced diet to prevent constipation. This may include: Choosing foods that are high in fiber, such as beans, whole grains, and fresh fruits and vegetables. Limiting foods that are high in fat and processed sugars, such as fried and sweet foods.  Lifestyle  Lose weight if needed. Do not use any products that contain nicotine or tobacco. These include cigarettes, chewing tobacco, and vaping devices, such as e-cigarettes. If you need help quitting, ask your health care provider.  General instructions Take over-the-counter and prescription medicines only as told by your health care provider. If you were prescribed an antibiotic medicine, take it as told by your health care provider. Do not stop taking the antibiotic even if you start to feel better. Use any implants or pessary as told by your health care provider. If needed, wear pads to absorb urine leakage. Keep a log to track how much and when you drink, and when you need to urinate. This will help your health care provider monitor your condition. Keep all follow-up visits. This is important. Contact a health care provider if: You have a fever or chills. Your symptoms do not get better with treatment. Your pain and discomfort get worse. You have more frequent urges to urinate. Get help right away if: You are not able to control your bladder. Summary Overactive bladder refers to a condition in which a person has a sudden and frequent need to urinate. Several conditions may lead to an overactive bladder. Treatment for overactive bladder depends on the cause  and severity of your condition. Making lifestyle changes, doing Kegel exercises, keeping a log, and taking medicines can help with this condition. This information is not intended to replace advice given to you by your health care provider. Make sure you discuss any questions you have with your healthcare provider. Document Revised: 12/28/2019 Document Reviewed: 12/28/2019 Elsevier Patient Education  2022 [name].   Patient verbalizes understanding of instructions provided today and agrees to view in [place].   Telephone follow up appointment with care management team member scheduled for:  Peter Garter RN, [facility], CDE Care Management Coordinator [place] [phone], Mobile [phone]

## 2020-11-07 NOTE — Chronic Care Management (AMB) (Signed)
Chronic Care Management   CCM RN Visit Note  11/07/2020 Name: Breanna Webster MRN: [id number] DOB: 1945/09/02  Subjective: Breanna Webster is a 75 y.o. year old female who is a primary care patient of [name], NP. The care management team was consulted for assistance with disease management and care coordination needs.    Engaged with patient by telephone for follow up visit in response to provider referral for case management and/or care coordination services.   Consent to Services:  The patient was given information about Chronic Care Management services, agreed to services, and gave verbal consent prior to initiation of services.  Please see initial visit note for detailed documentation.   Patient agreed to services and verbal consent obtained.   Assessment: Review of patient past medical history, allergies, medications, health status, including review of consultants reports, laboratory and other test data, was performed as part of comprehensive evaluation and provision of chronic care management services.   SDOH (Social Determinants of Health) assessments and interventions performed:    CCM Care Plan  Allergies  Allergen Reactions   Statins Other (See Comments)    Muscle aches    Outpatient Encounter Medications as of 11/07/2020  Medication Sig   BD INSULIN SYRINGE U/F 31G X 5/16" 1 ML MISC FOUR TIMES A DAY UNDER THE SKIN   cholecalciferol (VITAMIN D) 25 MCG (1000 UNIT) tablet Take 1,000 Units by mouth daily.   diltiazem (CARDIZEM CD) 120 MG 24 hr capsule TAKE ONE CAPSULE BY MOUTH DAILY   ELIQUIS 5 MG TABS tablet TAKE ONE TABLET BY MOUTH TWICE A DAY   escitalopram (LEXAPRO) 20 MG tablet Take 20 mg by mouth daily.   Lancets (ONETOUCH DELICA PLUS [id number]) MISC USE TO CHECK BLOOD SUGAR TWICE DAILY.Marland Kitchen   losartan (COZAAR) 50 MG tablet Take 50 mg by mouth daily.   metFORMIN (GLUCOPHAGE-XR) 500 MG 24 hr tablet Take 500 mg by mouth 2 (two) times daily.   Multiple Vitamin  (MULITIVITAMIN WITH MINERALS) TABS Take 1 tablet by mouth daily.   nitroGLYCERIN (NITROSTAT) 0.4 MG SL tablet Place 1 tablet (0.4 mg total) under the tongue every 5 (five) minutes as needed for chest pain.   NOVOLIN N 100 UNIT/ML injection Inject 35 Units into the skin daily before breakfast.   NOVOLIN R 100 UNIT/ML injection 10 UNITS THIRTY MINUTES BEFORE MEALS.  5 ADDITIONAL UNITS WITH CARBS OR SNACKS.   ONETOUCH VERIO test strip USE TO MONITOR GLUCOSE LEVELS TWICE DAILY   SYNTHROID 137 MCG tablet Take 137 mcg by mouth daily.   No facility-administered encounter medications on file as of 11/07/2020.    Patient Active Problem List   Diagnosis Date Noted   Sepsis secondary to UTI ([place]) 09/08/2020   Elevated ALT measurement 09/08/2020   History of CVA with residual deficit 09/08/2020   Hyperbilirubinemia 09/08/2020   Fatigue associated with anemia 08/03/2020   Cerebrovascular accident (CVA) due to embolism of right posterior cerebral artery ([place]) 08/03/2020   OSA treated with BiPAP 08/03/2020   Complex sleep apnea syndrome 08/03/2020   Treatment-emergent central sleep apnea 08/03/2020   Chronic intermittent hypoxia with obstructive sleep apnea 04/21/2020   OSA (obstructive sleep apnea) 04/21/2020   History of cardioembolic stroke 91/50/5697   Gait disturbance, post-stroke 03/29/2020   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 ([place]) 03/29/2020   Anxiety    RLQ abdominal pain 10/22/2019   Paroxysmal atrial fibrillation ([place]) 05/22/2019   Iron deficiency anemia 05/07/2019   Atrial fibrillation with RVR (  [place]) 10/21/2018   Cerebellar stroke, acute ([place]) 10/21/2018   Streptococcal endocarditis    Endocarditis of mitral valve 10/07/2018   Bacteremia due to Streptococcus [place]arius 10/07/2018   Sepsis ([place]) 62/95/2841   Acute metabolic encephalopathy 32/44/0102   Severe aortic stenosis 07/08/2018   S/P TAVR (transcatheter aortic valve replacement) 07/08/2018   Esophageal  thickening    CAD in native artery 04/22/2018   Gastroesophageal reflux disease    Pulmonary hypertension ([place]) 02/21/2018   Essential hypertension 07/15/2017   History of colonic polyps 05/22/2017   Elevated liver function tests 12/05/2016   DOE (dyspnea on exertion) 07/19/2016   Thalassemia minor 05/29/2016   Left bundle branch block 12/06/2015   Upper airway cough syndrome 10/14/2015   Myalgia 02/17/2014   Carotid artery stenosis 06/05/2013   Eustachian tube dysfunction 05/07/2013   Neuropathy of leg 03/07/2012   Anemia, unspecified 01/31/2012   Hot flashes 08/24/2010   Diabetes mellitus due to underlying condition, uncontrolled ([place]) 06/30/2010   Generalized abdominal pain 03/24/2010   Obesity (BMI 30.0-34.9) 12/21/2009   IBS (irritable bowel syndrome) 04/29/2009   Vitamin D deficiency 03/10/2009   Hypothyroidism 12/13/2008   Dyslipidemia 12/13/2008   Anxiety state 12/13/2008   Other specified disorders of bladder 12/13/2008    Conditions to be addressed/monitored:Atrial Fibrillation, CAD, HTN, HLD, DMII, and frequent urinary infections  Care Plan : RNCM:Diabetes Type 2 (Adult)  Updates made by [name], RN since 11/07/2020 12:00 AM     Problem: Lack of Long Term plan for self managment of Type 2 DM   Priority: Medium     Long-Range Goal: Effective long term self management of Type 2 DM   Start Date: 10/03/2020  Expected End Date: 03/23/2021  This Visit's Progress: On track  Recent Progress: On track  Priority: Medium  Note:   Objective:  Lab Results  Component Value Date   HGBA1C 7.4 (H) 09/08/2020   Lab Results  Component Value Date   CREATININE 0.67 09/16/2020   CREATININE 0.74 09/11/2020   CREATININE 0.77 09/10/2020   No results found for: EGFR Current Barriers:  Knowledge Deficits related to basic Diabetes pathophysiology and self care/management Knowledge Deficits related to medications used for management of diabetes Unable to  independently self manage Type 2 DM Unable to perform IADLs independently-Husband assists with transportation  Pt states that she her blood sugars have been good.  States she it to see Dr. [name] 12/12/20.  Reports following a low CHO diet.  States her husband has been helping her as needed.   Case Manager Clinical Goal(s):  patient will demonstrate improved adherence to prescribed treatment plan for diabetes self care/management as evidenced by: daily monitoring and recording of CBG  adherence to ADA/ carb modified diet adherence to prescribed medication regimen contacting provider for new or worsened symptoms or questions Interventions:  Collaboration with Carlisle Cater, Tommi Rumps, NP regarding development and update of comprehensive plan of care as evidenced by provider attestation and co-signature Inter-disciplinary care team collaboration (see longitudinal plan of care) Provided education to patient about basic DM disease process Reviewed medications with patient and discussed importance of medication adherence Discussed plans with patient for ongoing care management follow up and provided patient with direct contact information for care management team Reinforced s/sx hypoglycemia and hyperglycemia and importance of correct treatment Reviewed scheduled/upcoming provider appointments including: no upcoming with primary care, Endocrinologist 12/12/20, Urology 12/21/20, Neurology 12/07/20 Reinforced to check cbg before meals and record, calling endocrinologist or primary care provider   for  findings outside established parameters.   Referral made to community resources care guide team for assistance with potential transportation issues if husband is unable to drive-did not connect with care guide not issue at this time Review of patient status, including review of consultants reports, relevant laboratory and other test results, and medications completed. Declines LCSW referral for stress management of her  chronic illnesses  Self-Care Activities - Self administers oral medications as prescribed Self administers insulin as prescribed Attends all scheduled provider appointments Checks blood sugars as prescribed and utilize hyper and hypoglycemia protocol as needed Adheres to prescribed ADA/carb modified Patient Goals: - check blood sugar at prescribed times - check blood sugar if I feel it is too high or too low - enter blood sugar readings and medication or insulin into daily log - take the blood sugar log to all doctor visits - change to whole grain breads, cereal, pasta - drink 6 to 8 glasses of water each day - fill half of plate with vegetables - manage portion size - read food labels for fat, fiber, carbohydrates and portion size - switch to sugar-free drinks - keep appointment with eye doctor - check feet daily for cuts, sores or redness Follow Up Plan: Telephone follow up appointment with care management team member scheduled for: 12/19/20 at 11 AM The patient has been provided with contact information for the care management team and has been advised to call with any health related questions or concerns.      Care Plan : RNCM:Urinary Incontinence (Adult)  Updates made by [name], RN since 11/07/2020 12:00 AM     Problem: Symptom Management (Urinary Incontinence)   Priority: High     Long-Range Goal: Urinary Incontinence Symptoms Manged   Start Date: 10/03/2020  Expected End Date: 03/23/2021  This Visit's Progress: On track  Recent Progress: On track  Priority: High  Note:   Current Barriers:  Ineffective Self Health Maintenance of urinary incontinence with hx of UTI, DM2, CAD, hx of CVA, atrial fib,  anemia Unable to independently self manage urinary incontinence Unable to perform IADLs independently-husband provides transportation to appointments States that she is still having nocturia 1-2 a night and she has urgency if she does not get to bathroom soon enough  States she is to see Dr. [name] on 12/19/20 because he was sick and it was rescheduled. Denies dysuria Clinical Goal(s):  Collaboration with [name], NP regarding development and update of comprehensive plan of care as evidenced by provider attestation and co-signature Inter-disciplinary care team collaboration (see longitudinal plan of care) patient will work with care management team to address care coordination and chronic disease management needs related to Disease Management Educational Needs Care Coordination   Interventions:  Evaluation of current treatment plan related to CAD, HTN, HLD, DMII, Anxiety, and hx CVA, atrial fib , Transportation, ADL IADL limitations, and Inability to perform IADL's independently self-management and patient's adherence to plan as established by provider. Collaboration with [name], NP regarding development and update of comprehensive plan of care as evidenced by provider attestation       and co-signature Inter-disciplinary care team collaboration (see longitudinal plan of care) Discussed plans with patient for ongoing care management follow up and provided patient with direct contact information for care management team Reinforced on s/sx UTI and when to call MD Discussed to try doing pelvic floor exercises when she feels urgency and to void every 2 hours during the day Reinforced to keep perineal area clean and  dry Reviewed to keep urology appointment on 12/19/20 with Dr. [name] and encourgaed to discuss treatment options for incontinence and overactive bladder Self Care Activities:  Patient verbalizes understanding of plan to self manage urinary incontinence Self administers medications as prescribed Attends all scheduled provider appointments Calls pharmacy for medication refills Calls provider office for new concerns or questions Patient Goals: - clean and dry skin well - keep skin dry - use a fragrance-free lotion on skin - wear  a protective pad or garment Follow Up Plan: Telephone follow up appointment with care management team member scheduled for: 12/19/20 at 11 AM The patient has been provided with contact information for the care management team and has been advised to call with any health related questions or concerns.      Care Plan : RNCM:Cardiovascular Disease Management(HTN, CAD, Atrial fibrillation)  Updates made by [name], RN since 11/07/2020 12:00 AM     Problem: Lack of long term management of Cardiovascular Disease(HTN, CAD, Atrial fibrillation   Priority: Medium     Long-Range Goal: Effective Cardiovascular Disease Self  Management(HTN, CAD, Atrial fibrillation)   Start Date: 10/03/2020  Expected End Date: 03/23/2021  This Visit's Progress: On track  Recent Progress: On track  Priority: Medium  Note:   Current Barriers:  Knowledge deficits related to self health management of Cardiovascular Disease Management(HTN, CAD, Atrial fibrillation)  Knowledge Deficits related to Self management of Cardiovascular Disease Management(HTN, CAD, Atrial fibrillation Care Coordination needs related to disease management  in a patient with Self management of Cardiovascular Disease Management(HTN, CAD, Atrial fibrillation Chronic Disease Management support and education needs related to Self management of Cardiovascular Disease Management(HTN, CAD, Atrial fibrillation Unable to independently Self management of Cardiovascular Disease Management(HTN, CAD, Atrial fibrillation Unable to perform IADLs independently States her B/P has been lower with readings around 125/70-80, States she is still having fatigue.  States she has been having issues with her CPAP and plans to discuss with Dr. Brett Fairy at her next visit.  No reports of chest pain or shortness of breath.  States she is going on a cruise in August and she is looking forward to the trip Nurse Case Manager Clinical Goal(s):  patient will take all  medications exactly as prescribed and will call provider for medication related questions patient will verbalize understanding of Afib Action Plan and when to call doctor patient will verbalize understanding of plan for Self management of Cardiovascular Disease Management(HTN, CAD, Atrial fibrillation patient will meet with RN Care Manager to address Self management of Cardiovascular Disease Management(HTN, CAD, Atrial fibrillation patient will take all medications exactly as prescribed and will call provider for medication related questions patient will attend all scheduled medical appointments: no upcoming primary care provider scheduled, urology 12/19/20, neurology 12/07/20 patient will verbalize basic understanding of Self management of Cardiovascular Disease Management(HTN, CAD, Atrial fibrillation disease process and self health management plan as evidenced by verbalizing understanding of education, voiced adherence to treatment plan the patient will demonstrate ongoing self health care management ability as evidenced by voiced understanding of treatment plan and decrease in hospitalizations * Interventions:  Collaboration with [name], NP regarding development and update of comprehensive plan of care as evidenced by provider attestation and co-signature Inter-disciplinary care team collaboration (see longitudinal plan of care) Basic overview and discussion of HTN, CAD, Atrial fibrillation Medications reviewed Afib action plan reviewed Evaluation of current treatment plan related to Self management of Cardiovascular Disease Management(HTN, CAD, Atrial fibrillation and patient's adherence to plan as  established by provider. Advised patient to discuss issues with Dr. Brett Fairy concerning her CPAP Provided education to patient re: HTN, CAD, Atrial fibrillation Reviewed medications with patient and discussed adherence Reinforced to monitor blood pressure daily and record, calling provider for  findings outside established parameters.  Care Guide referral for potential transportation issues if husband is unable to provide-no longer issue a this time Discussed plans with patient for ongoing care management follow up and provided patient with direct contact information for care management team Self-Care Activities: Self administers medications as prescribed Attends all scheduled provider appointments Calls pharmacy for medication refills Calls provider office for new concerns or questions Patient Goals: - begin a symptom diary - make a plan to eat healthy - keep all lab appointments - take medicine as prescribed - check blood pressure 3 times per week - choose a place to take my blood pressure (home, clinic or office, retail store) - write blood pressure results in a log or diary Follow Up Plan: Telephone follow up appointment with care management team member scheduled for: 12/19/20 at 11 AM The patient has been provided with contact information for the care management team and has been advised to call with any health related questions or concerns.       Plan:Telephone follow up appointment with care management team member scheduled for:  12/19/20 and The patient has been provided with contact information for the care management team and has been advised to call with any health related questions or concerns.  Peter Garter RN, Jackquline Denmark, CDE Care Management Coordinator [facility] [phone], Mobile [phone]

## 2020-11-09 DIAGNOSIS — E039 Hypothyroidism, unspecified: Secondary | ICD-10-CM | POA: Diagnosis not present

## 2020-11-15 ENCOUNTER — Telehealth: Payer: Self-pay | Admitting: Interventional Cardiology

## 2020-11-15 NOTE — Telephone Encounter (Signed)
Pt c/o of Chest Pain: STAT if CP now or developed within 24 hours  1. Are you having CP right now? no  2. Are you experiencing any other symptoms (ex. SOB, nausea, vomiting, sweating)? no  3. How long have you been experiencing CP? 2 episodes about a month apart, last one was about 2 weeks ago  4. Is your CP continuous or coming and going? Came and went  5. Have you taken Nitroglycerin? no  Patient states she has had 2 episodes of chest pain on her left side. She states it only lasts about 3 minutes and they happened about a month apart. She says she is not sure if it has to do with the valve transplant or if it is an atery blockage. She says she is going on a boat 8/6 and will be away for a week. She would like to know if she needs to be checked for an appointment prior to this. She states she also would like to know if she needs to be checking in with the people who put in the implant.  ?

## 2020-11-15 NOTE — Telephone Encounter (Signed)
Pt states she has had 2 episodes of CP recently that were about 3 weeks apart.  Last one occurred 2 weeks. First episode was on the right side, second on the left side.  Described as a squeezing feeling.  Nothing makes it worse or better.  Denies SOB, HA, lightheadedness, dizziness or other issues when CP occurred.  Did not use Nitro.  Episodes lasted about 3 minutes and resolved.  States vitals are always fine (didn't have readings with her).  Pt going out on a boat on 8/6 for a week and concerned about being gone without knowing what's going on.  Advised I will send to Dr. Tamala Julian to see if he feels she needs to be seen or have any testing.  Pt appreciative for call.    Pt also inquired about whether or not she needed to see TAVR team.  Advised she only sees them for a year and then is released back to regular f/u with Dr. Tamala Julian.  Pt appreciative for information.

## 2020-11-16 DIAGNOSIS — I2510 Atherosclerotic heart disease of native coronary artery without angina pectoris: Secondary | ICD-10-CM | POA: Diagnosis not present

## 2020-11-16 DIAGNOSIS — I4891 Unspecified atrial fibrillation: Secondary | ICD-10-CM | POA: Diagnosis not present

## 2020-11-16 DIAGNOSIS — Z794 Long term (current) use of insulin: Secondary | ICD-10-CM | POA: Diagnosis not present

## 2020-11-16 DIAGNOSIS — Z6829 Body mass index (BMI) 29.0-29.9, adult: Secondary | ICD-10-CM | POA: Diagnosis not present

## 2020-11-16 DIAGNOSIS — Z8673 Personal history of transient ischemic attack (TIA), and cerebral infarction without residual deficits: Secondary | ICD-10-CM | POA: Diagnosis not present

## 2020-11-16 DIAGNOSIS — E039 Hypothyroidism, unspecified: Secondary | ICD-10-CM | POA: Diagnosis not present

## 2020-11-16 DIAGNOSIS — Z952 Presence of prosthetic heart valve: Secondary | ICD-10-CM | POA: Diagnosis not present

## 2020-11-16 DIAGNOSIS — E1149 Type 2 diabetes mellitus with other diabetic neurological complication: Secondary | ICD-10-CM | POA: Diagnosis not present

## 2020-11-16 NOTE — Telephone Encounter (Signed)
She needs a fresh prescription for NTG and instructions on use. Has had two 3 minute episodes of pain, of uncertain etiology. Has any been precipitated by activity. With such short duration, suggest she collect more data and let us know. If she feels uncomfortable going on boat, don't go.

## 2020-11-17 NOTE — Telephone Encounter (Signed)
Left message to call back  

## 2020-11-18 ENCOUNTER — Other Ambulatory Visit: Payer: Medicare Other

## 2020-11-22 ENCOUNTER — Telehealth: Payer: Self-pay | Admitting: Adult Health

## 2020-11-22 NOTE — Telephone Encounter (Signed)
Left message to call back  

## 2020-11-22 NOTE — Telephone Encounter (Signed)
PT called to advise that she is leaving Saturday and wants to come by tomorrow morning to dropoff a Urine Sample as she thinks she has a UTI. PT wants to get it read asap afterwards that way something can be called in before she leaves Saturday. Got her schedule for a dropoff tomorrow.

## 2020-11-22 NOTE — Telephone Encounter (Signed)
Lab appt. Cancelled pt has been scheduled. No further action needed. Pt is aware.

## 2020-11-23 ENCOUNTER — Encounter: Payer: Self-pay | Admitting: Internal Medicine

## 2020-11-23 ENCOUNTER — Other Ambulatory Visit: Payer: BLUE CROSS/BLUE SHIELD

## 2020-11-23 ENCOUNTER — Other Ambulatory Visit: Payer: Self-pay

## 2020-11-23 ENCOUNTER — Ambulatory Visit (INDEPENDENT_AMBULATORY_CARE_PROVIDER_SITE_OTHER): Payer: Medicare Other | Admitting: Internal Medicine

## 2020-11-23 VITALS — BP 130/70 | HR 79 | Temp 98.3°F | Wt 179.0 lb

## 2020-11-23 DIAGNOSIS — I2510 Atherosclerotic heart disease of native coronary artery without angina pectoris: Secondary | ICD-10-CM

## 2020-11-23 DIAGNOSIS — N3000 Acute cystitis without hematuria: Secondary | ICD-10-CM | POA: Diagnosis not present

## 2020-11-23 LAB — POCT URINALYSIS DIPSTICK
Bilirubin, UA: NEGATIVE
Blood, UA: NEGATIVE
Glucose, UA: NEGATIVE
Ketones, UA: NEGATIVE
Nitrite, UA: NEGATIVE
Protein, UA: NEGATIVE
Spec Grav, UA: 1.015 (ref 1.010–1.025)
Urobilinogen, UA: 0.2 E.U./dL
pH, UA: 6 (ref 5.0–8.0)

## 2020-11-23 MED ORDER — CIPROFLOXACIN HCL 250 MG PO TABS: 250.0000 mg | ORAL_TABLET | Freq: Two times a day (BID) | ORAL | 0 refills | Status: AC

## 2020-11-23 NOTE — Progress Notes (Signed)
Acute office Visit     This visit occurred during the SARS-CoV-2 public health emergency.  Safety protocols were in place, including screening questions prior to the visit, additional usage of staff PPE, and extensive cleaning of exam room while observing appropriate contact time as indicated for disinfecting solutions.    CC/Reason for Visit: "I think I have a UTI"  HPI: Breanna Webster is a 75 y.o. female who is coming in today for the above mentioned reasons.  She has a history of recurrent UTIs.  She has had at least four this year.  She has an upcoming appointment with urology for this.  She was hospitalized in May due to E. coli sepsis from UTI.  She has been having frequency, urgency and cloudy colored urine now for the last 2 days.  No blood or dysuria.  No fever.  Past Medical/Surgical History: Past Medical History:  Diagnosis Date   Anxiety    Arthritis    "back" (04/22/2018)   Back pain    Blood transfusion without reported diagnosis    CAD (coronary artery disease)    a. 03/2018 s/p PCI/DES to the RCA (3.0x15 Onyx DES).   Carotid artery stenosis    Mild   Chest pain    Chronic lower back pain    Cirrhosis (HCC)    Colon polyps    Diverticulitis    Diverticulosis    Esophageal thickening    seen on pre TAVR CT scan, also questionable cirrhosis. MRI recommended. Will refer to GI   Fatty liver    GERD (gastroesophageal reflux disease)    Grave's disease    History of colonic polyps 05/22/2017   History of hiatal hernia    Hypertension    Hypothyroidism    IBS (irritable bowel syndrome)    Osteopenia    Pulmonary nodules    seen on pre TAVR CT. likley benign. no follow up recommended if pt low risk.   S/P TAVR (transcatheter aortic valve replacement)    Severe aortic stenosis    Shortness of breath on exertion    Stroke ([place])    Thalassemia minor    Thyroid disease    Type II diabetes mellitus ([place])     Past Surgical History:  Procedure Laterality  Date   1 HOUR [place] STUDY N/A 03/03/2018   Procedure: 24 HOUR PH STUDY;  Surgeon: Mauri Pole, MD;  Location: [facility];  Service: Endoscopy;  Laterality: N/A;   COLONOSCOPY     COLONOSCOPY W/ BIOPSIES AND POLYPECTOMY     CORONARY ANGIOGRAPHY Right 04/21/2018   Procedure: CORONARY ANGIOGRAPHY (CATH LAB);  Surgeon: [name], MD;  Location: [place] [facility];  Service: Cardiovascular;  Laterality: Right;   CORONARY STENT INTERVENTION N/A 04/22/2018   Procedure: CORONARY STENT INTERVENTION;  Surgeon: [name], MD;  Location: [place] [facility];  Service: Cardiovascular;  Laterality: N/A;   DILATION AND CURETTAGE OF UTERUS     ESOPHAGEAL MANOMETRY N/A 03/03/2018   Procedure: ESOPHAGEAL MANOMETRY (EM);  Surgeon: Mauri Pole, MD;  Location: [facility];  Service: Endoscopy;  Laterality: N/A;   GASTRIC FUNDOPLICATION     HERNIA REPAIR     HYSTEROSCOPY     fibroids   LAPAROSCOPIC CHOLECYSTECTOMY     LAPAROSCOPY     fibroids   NISSEN FUNDOPLICATION  3818E   POLYPECTOMY     RIGHT/LEFT HEART CATH AND CORONARY ANGIOGRAPHY N/A 02/20/2018   Procedure: RIGHT/LEFT HEART CATH AND  CORONARY ANGIOGRAPHY;  Surgeon: [name], MD;  Location: [place] [facility];  Service: Cardiovascular;  Laterality: N/A;   TEE WITHOUT CARDIOVERSION N/A 07/08/2018   Procedure: TRANSESOPHAGEAL ECHOCARDIOGRAM (TEE);  Surgeon: Burnell Blanks, MD;  Location: [place];  Service: Open Heart Surgery;  Laterality: N/A;   TEE WITHOUT CARDIOVERSION  10/07/2018   TEE WITHOUT CARDIOVERSION N/A 10/07/2018   Procedure: TRANSESOPHAGEAL ECHOCARDIOGRAM (TEE);  Surgeon: Jerline Pain, MD;  Location: [facility] END[facility];  Service: Cardiovascular;  Laterality: N/A;   TONSILLECTOMY     TRANSCATHETER AORTIC VALVE REPLACEMENT, TRANSFEMORAL N/A 07/08/2018   Procedure: TRANSCATHETER AORTIC VALVE REPLACEMENT, TRANSFEMORAL;  Surgeon: Burnell Blanks, MD;  Location: [place];  Service: Open Heart Surgery;   Laterality: N/A;    Social History:  reports that she has never smoked. She has never used smokeless tobacco. She reports that she does not drink alcohol and does not use drugs.  Allergies: Allergies  Allergen Reactions   Statins Other (See Comments)    Muscle aches    Family History:  Family History  Adopted: Yes  Problem Relation Age of Onset   Healthy Son        x 2   Headache Other        Cluster headaches   Heart failure Mother    Colon cancer Neg Hx    Pancreatic cancer Neg Hx    Rectal cancer Neg Hx    Stomach cancer Neg Hx      Current Outpatient Medications:    BD INSULIN SYRINGE U/F 31G X 5/16" 1 ML MISC, FOUR TIMES A DAY UNDER THE SKIN, Disp: 200 each, Rfl: 1   cholecalciferol (VITAMIN D) 25 MCG (1000 UNIT) tablet, Take 1,000 Units by mouth daily., Disp: , Rfl:    ciprofloxacin (CIPRO) 250 MG tablet, Take 1 tablet (250 mg total) by mouth 2 (two) times daily for 5 days., Disp: 10 tablet, Rfl: 0   diltiazem (CARDIZEM CD) 120 MG 24 hr capsule, TAKE ONE CAPSULE BY MOUTH DAILY, Disp: 90 capsule, Rfl: 1   ELIQUIS 5 MG TABS tablet, TAKE ONE TABLET BY MOUTH TWICE A DAY, Disp: 60 tablet, Rfl: 2   escitalopram (LEXAPRO) 20 MG tablet, Take 20 mg by mouth daily., Disp: , Rfl:    Lancets (ONETOUCH DELICA PLUS [id number]) MISC, USE TO CHECK BLOOD SUGAR TWICE DAILY.., Disp: 200 each, Rfl: 0   losartan (COZAAR) 50 MG tablet, Take 50 mg by mouth daily., Disp: , Rfl:    metFORMIN (GLUCOPHAGE-XR) 500 MG 24 hr tablet, Take 500 mg by mouth 2 (two) times daily., Disp: , Rfl:    Multiple Vitamin (MULITIVITAMIN WITH MINERALS) TABS, Take 1 tablet by mouth daily., Disp: , Rfl:    nitroGLYCERIN (NITROSTAT) 0.4 MG SL tablet, Place 1 tablet (0.4 mg total) under the tongue every 5 (five) minutes as needed for chest pain., Disp: 25 tablet, Rfl: 3   NOVOLIN N 100 UNIT/ML injection, Inject 35 Units into the skin daily before breakfast., Disp: , Rfl:    NOVOLIN R 100 UNIT/ML injection, 10 UNITS  THIRTY MINUTES BEFORE MEALS.  5 ADDITIONAL UNITS WITH CARBS OR SNACKS., Disp: , Rfl:    ONETOUCH VERIO test strip, USE TO MONITOR GLUCOSE LEVELS TWICE DAILY, Disp: 200 strip, Rfl: 3   SYNTHROID 137 MCG tablet, Take 137 mcg by mouth daily., Disp: , Rfl:   Review of Systems:  Constitutional: Denies fever, chills, diaphoresis, appetite change and fatigue.  HEENT: Denies photophobia, eye pain, redness, hearing  loss, ear pain, congestion, sore throat, rhinorrhea, sneezing, mouth sores, trouble swallowing, neck pain, neck stiffness and tinnitus.   Respiratory: Denies SOB, DOE, cough, chest tightness,  and wheezing.   Cardiovascular: Denies chest pain, palpitations and leg swelling.  Gastrointestinal: Denies nausea, vomiting, abdominal pain, diarrhea, constipation, blood in stool and abdominal distention.  Genitourinary: Denies dysuria,  hematuria, flank pain and difficulty urinating.  Endocrine: Denies: hot or cold intolerance, sweats, changes in hair or nails, polyuria, polydipsia. Musculoskeletal: Denies myalgias, back pain, joint swelling, arthralgias and gait problem.  Skin: Denies pallor, rash and wound.  Neurological: Denies dizziness, seizures, syncope, weakness, light-headedness, numbness and headaches.  Hematological: Denies adenopathy. Easy bruising, personal or family bleeding history  Psychiatric/Behavioral: Denies suicidal ideation, mood changes, confusion, nervousness, sleep disturbance and agitation    Physical Exam: Vitals:   11/23/20 0953  BP: 130/70  Pulse: 79  Temp: 98.3 F (36.8 C)  TempSrc: Oral  SpO2: 95%  Weight: 179 lb (81.2 kg)    Body mass index is 29.79 kg/m.   Constitutional: NAD, calm, comfortable Eyes: PERRL, lids and conjunctivae normal ENMT: Mucous membranes are moist.  Psychiatric: Normal judgment and insight. Alert and oriented x 3. Normal mood.    Impression and Plan:  Acute cystitis without hematuria  - Plan: POCT urinalysis dipstick, Urine  Culture, ciprofloxacin (CIPRO) 250 MG tablet, Urine Culture -In office urine dipstick shows 2+ leukocytes, negative nitrate, negative blood. -Urine sample has been sent for urine analysis and culture. -Based off of prior culture sensitivities I will go ahead and prescribe Cipro 250 mg twice daily for 5 days.    Lelon Frohlich, MD [facility] at [facility]

## 2020-11-24 DIAGNOSIS — Z20822 Contact with and (suspected) exposure to covid-19: Secondary | ICD-10-CM | POA: Diagnosis not present

## 2020-11-24 NOTE — Telephone Encounter (Signed)
Spoke with pt and reviewed recommendations from Dr. Tamala Julian.  Pt states CP occurred when she was getting out of bed.  Has not had any episodes since we last spoke.  She is going to go on her trip.  Has a fresh bottle of nitro at home already.  Reviewed nitro instructions.  Advised to call the office if she has to use it and let us know the results.  Pt verbalized understanding and was in agreement with plan.

## 2020-11-24 NOTE — Telephone Encounter (Signed)
Left message to call back  

## 2020-11-25 LAB — URINE CULTURE
MICRO NUMBER:: 12196601
SPECIMEN QUALITY:: ADEQUATE

## 2020-12-05 DIAGNOSIS — E039 Hypothyroidism, unspecified: Secondary | ICD-10-CM | POA: Diagnosis not present

## 2020-12-05 DIAGNOSIS — E1149 Type 2 diabetes mellitus with other diabetic neurological complication: Secondary | ICD-10-CM | POA: Diagnosis not present

## 2020-12-05 LAB — BASIC METABOLIC PANEL
BUN: 13 (ref 4–21)
CO2: 28 — AB (ref 13–22)
Chloride: 107 (ref 99–108)
Creatinine: 0.7 (ref 0.5–1.1)
Glucose: 166
Potassium: 4.1 (ref 3.4–5.3)
Sodium: 140 (ref 137–147)

## 2020-12-05 LAB — TSH: TSH: 1.44 (ref 0.41–5.90)

## 2020-12-05 LAB — COMPREHENSIVE METABOLIC PANEL
Albumin: 4.1 (ref 3.5–5.0)
Calcium: 10.4 (ref 8.7–10.7)
GFR calc Af Amer: 99
GFR calc non Af Amer: 86

## 2020-12-05 LAB — HEPATIC FUNCTION PANEL
ALT: 44 — AB (ref 7–35)
AST: 34 (ref 13–35)
Alkaline Phosphatase: 80 (ref 25–125)
Bilirubin, Total: 0.8

## 2020-12-05 LAB — LIPID PANEL
Cholesterol: 188 (ref 0–200)
HDL: 55 (ref 35–70)
LDL Cholesterol: 110
LDl/HDL Ratio: 2
Triglycerides: 113 (ref 40–160)

## 2020-12-07 ENCOUNTER — Ambulatory Visit: Payer: Medicare Other | Admitting: Adult Health

## 2020-12-07 ENCOUNTER — Encounter: Payer: Self-pay | Admitting: Hematology

## 2020-12-07 ENCOUNTER — Ambulatory Visit: Payer: Medicare Other | Admitting: Neurology

## 2020-12-07 NOTE — Progress Notes (Deleted)
[facility] [address] [place]. [place] [address] [phone]       OFFICE FOLLOW UP NOTE  Ms. REVECCA Webster Date of Birth:  10-11-1945 Medical Record Number:  [id number]   Reason for Referral: stroke follow up    CHIEF COMPLAINT:  No chief complaint on file.   HPI:  Today, 12/07/2020, [name] returns for stroke f/u accompanied by her husband.        History provided for reference purposes only Update 06/22/2020 JM: Breanna Webster returns for stroke follow-up accompanied by her husband.  Reports continued mild left hand tremor with mild left hand weakness -stable without worsening Prior complaints of gait unsteadiness -reports great improvement after working with PT -she has been able to increase ambulation distance - per husband, able to walk 3-4 blocks without stopping where previously difficulty ambulating very short distances She also reports occasional dizziness typically with position changes that last short duration  She does report in December, she presented to the ED with acute onset right eye visual loss which lasted approximately 15 minutes and then completely resolved.  CTA head/neck unremarkable. Reports evaluation by ophthalmology with no concerning findings that may have possibly contributed.  She has not had any additional visual loss or visual symptoms  Reports compliance on Eliquis 5 mg twice daily -denies bleeding or bruising Glucose levels stable with recent A1c 6.5 (04/2020) Routinely follows with cardiology, PCP and endocrinology  Due to concerns of continued excessive daytime fatigue, she was referred to [facility] for possible repeat sleep study as prior sleep study in 08/2018 was negative for sleep apnea.  Evaluated by Dr. Brett Fairy and underwent sleep study on 03/29/2020 which showed AHI 12.8 with REM sleep associated with continuous low oxygen saturation with total O2 desat time 70 minutes with SPO2 nadir 76% and mild  PLM.  Underwent CPAP titration 04/21/2020 with apnea unresponsive to hypopneas, apnea and desaturations but OSA responded partially to BiPAP 14/10 with central sleep apnea present with higher BiPAP pressures, sleep-related hypoxemia improved greatly on CPAP and BiPAP, presence of mild PLM confirmed.  Recommended initiating auto BiPAP which she started on 2/9.  She appears to be tolerating without difficulty but concerned as she continues to feel daytime fatigue.  No further concerns at this time  Update 12/21/2019 JM: Breanna Webster returns for stroke follow-up.  She is accompanied by her husband. Residual deficits of left hand tremor, unsteadiness and short-term memory impairment.  She will have occasional dizziness but typically upon standing from seated position.  Denies any reoccurring diplopia Prior concern of worsening diplopia, imbalance, dizziness and left hand shaking therefore obtain MRI which was negative for acute abnormality She was referred to PT at prior visit but for unknown reason, they were not called to schedule initial evaluation.  She is interested in pursuing PT Denies new stroke/TIA symptoms Remains on Eliquis 5 mg twice daily without bleeding or bruising for secondary stroke prevention and history of atrial fibrillation Blood pressure today 128/64 Glucose levels stable per patient HTN, HLD and DM currently managed by PCP She has multiple other complaints today: She also reports excessive daytime fatigue, insomnia, occasional nocturia and snoring Underwent sleep study approximately 1 year ago which showed mild sleep apnea with AHI 5.0.  Does report worsening daytime fatigue and insomnia and she continues to question accuracy of sleep study results She is currently being evaluated by neurosurgery for right underarm and chest numbness/tingling with concerns of thoracic involvement.  Plans on obtaining MRI  cervical and thoracic scheduled on 01/15/2020 No further concerns at this  time  Update 09/14/2019 JM: Breanna Webster is a 75 year old female with underlying medical history of DVT history, aortic stenosis s/p TAVR 06/2018, hypothyroidism, pulmonary hypertension, HTN, DM, CAD s/p stenting 03/2018, hx of infective endocarditis 09/2018 and multifocal bilateral anterior posterior infarcts secondary to new onset A. fib in 09/2018.  Patient requests appointment today with concern for intermittent double vision, imbalance, dizziness and left hand shaking.  She is accompanied by her husband.  Previously seen roughly 6 months ago stable from a stroke standpoint and advised to follow-up as needed.  Prior complaints of intermittent diplopia, left hemiparesis and imbalance secondary to multiple cardioembolic strokes throughout the supratentorial brain, brainstem and cerebellum including multiple lesions within the corpus callosum.  She believes she continued to have intermittent symptoms but believes over the past few months symptoms have worsened or become more frequent.  she did have ophthalmology evaluation on 09/07/2019 with concern of bilateral diplopia worsened with visual tasks with saccadic changes.  Experiences worsening dizziness or diplopia while watching TV for too long.  Also experience dizziness if standing too quickly or rapid head movements.  Symptoms only last for 10 to 20 seconds and then resolve.  Episodes occur 1-2 times per week.  Denies headache, migraine, worsening weakness, numbness/tingling, speech impairment or confusion.  She endorses ongoing compliance with all prescribed medications including Eliquis 5 mg twice daily.  Blood pressure monitored at home which has been stable with today's reading 125/75.  Underlying mild cognitive impairment post stroke which has been stable without worsening.  She questions clearance for driving.  Continues to follow with PCP for HTN, HLD and DM management.  No further concerns at this time.  Update 03/12/2019 JM: Breanna Webster is a  75 year old female who is being seen today for stroke follow-up accompanied by her husband.  She continues to experience balance difficulties, mild left hemiparesis and mild cognitive impairment without much improvement.  Her greatest concern today is ongoing shortness of breath which has been present since the beginning of this year.  Her shortness of breath severely limits her daily functioning and activity.  Recently seen by Dr. Tamala Julian, her cardiologist, who felt continued shortness of breath was multifactorial but highly encouraged increasing activity and possibly cardiac rehab.  She feels as though "something is being missed" and is overly frustrated.  She also continues to experience excessive daytime fatigue and insomnia at night.  Prior sleep study negative for OSA.  She continues on Eliquis and aspirin without bleeding or bruising.  Blood pressure today satisfactory at 128/78.  Continues on home Lexapro 20 mg daily and feels as though depression and anxiety stable.  She also continues on amitriptyline 25 mg nightly for prior concerns with headache.  She denies any reoccurring headaches at this time.  No further concerns at this time.  Initial visit 12/10/2018 JM: Residual deficits of mild left hemiparesis, mild left facial droop with occasional speech difficulty, balance difficulties, decreased vision and short-term memory concerns.  She does have difficulty ambulating at times stating her legs feel "rubbery" typically worse worsening in the morning and in the evening.  She is able to ambulate without assistive device.  Prior complaints of left-sided vision with sensation of a shade pulled over her left eye but this is since resolved and recently obtained new prescription glasses by ophthalmology.  She also endorses short-term memory concerns such as forgetting recent question/answer or her husband having to continuously repeat himself.  She denies improvement but her husband states he has seen improvement  of her overall ambulation and memory concerns.  She has not received any therapy as this was not recommended at discharge.  She is questioning possible participation in therapy. Previously experiencing left-sided severe headache with recent cessation with continued use of  amitriptyline 25 mg nightly and gabapentin 300 mg nightly.  It was recommended to discontinue gabapentin 300 mg nightly by PCP as she did not gain benefit but she is not aware of this and has continued.  Symptoms previously located left occipital radiating to frontal region with short duration sharp stabbing radiating pain which appears to be secondary to possible occipital neuralgia. Previously underwent sleep study for possible OSA which was negative Continues on Eliquis and aspirin 81 mg without bleeding or bruising Continues to follow with PCP for DM management Blood pressure today 107/60 States depression/anxiety well controlled with continuation of Lexapro No further concerns at this time  Stroke admission 10/21/2018: Ms. NIKOL LEMAR is a 75 y.o. female with history of DVT, aortic stenosis s/p TAVR in March 2020, hypothyroidism, pulmonary hypertension, HTN, CADs/p stenting December 2019 recently admitted for infective endocarditis on the mitral valve treated with 2 weeks gentamicin and ceftriaxone via PEG who presented on 10/21/2018 to the hospital with increased shortness of breath and confusion x2 weeks along with headache.  No focal neurologic symptoms.  New A. Fib with RVR found in the ED and started on heparin drip.  Neurology consulted with work-up revealing multifocal bilateral anterior and posterior infarcts cardioembolic pattern as evidenced on MRI secondary to new onset A. fib versus recent endocarditis.  MRI showed diffuse scattered subcentimeter foci of reduced diffusion and intermediate diffusion throughout the supratentorial brain, brainstem and cerebellum including multiple lesions within the corpus callosum.   Vessel imaging unremarkable with only mild arthrosclerotic bilateral ICA bifurcations.  2D echo normal EF without mention of vegetation.  Recommended initiating D [place] with new onset of A. fib.  HTN stable.  LDL 85 and due to history of statin allergy with myalgias, statin initiation was not recommended.  A1c 7.5 and recommended follow-up with PCP for uncontrolled DM.  Other stroke risk factors include advanced age, obesity, CAD, MVR and status post TAVR 06/2018.  She was discharged home in stable condition without therapy.  She returned to ED on 11/17/2018 with complaints of sudden onset left-sided headache located left parietal occipital area at the base of her neck.  CTA unremarkable for dissection or other abnormality.  She did receive 2 migraine cocktails with improvement of headache symptoms.  She has since followed up with her PCP who initiated gabapentin along with continuation of nortriptyline 50 mg nightly.  She apparently did not gain benefit from gabapentin therefore recommended discontinuing gabapentin and nortriptyline by PCP and initiated on amitriptyline.  She did have follow-up with ophthalmologist with concerns of occipital neuralgia.        ROS:   14 system review of systems performed and negative with exception of see HPI  PMH:  Past Medical History:  Diagnosis Date   Anxiety    Arthritis    "back" (04/22/2018)   Back pain    Blood transfusion without reported diagnosis    CAD (coronary artery disease)    a. 03/2018 s/p PCI/DES to the RCA (3.0x15 Onyx DES).   Carotid artery stenosis    Mild   Chest pain    Chronic lower back pain    Cirrhosis (HCC)    Colon polyps  Diverticulitis    Diverticulosis    Esophageal thickening    seen on pre TAVR CT scan, also questionable cirrhosis. MRI recommended. Will refer to GI   Fatty liver    GERD (gastroesophageal reflux disease)    Grave's disease    History of colonic polyps 05/22/2017   History of hiatal hernia     Hypertension    Hypothyroidism    IBS (irritable bowel syndrome)    Osteopenia    Pulmonary nodules    seen on pre TAVR CT. likley benign. no follow up recommended if pt low risk.   S/P TAVR (transcatheter aortic valve replacement)    Severe aortic stenosis    Shortness of breath on exertion    Stroke (HCC)    Thalassemia minor    Thyroid disease    Type II diabetes mellitus (HCC)     PSH:  Past Surgical History:  Procedure Laterality Date   58 HOUR [place] STUDY N/A 03/03/2018   Procedure: 24 HOUR PH STUDY;  Surgeon: Mauri Pole, MD;  Location: [facility];  Service: Endoscopy;  Laterality: N/A;   COLONOSCOPY     COLONOSCOPY W/ BIOPSIES AND POLYPECTOMY     CORONARY ANGIOGRAPHY Right 04/21/2018   Procedure: CORONARY ANGIOGRAPHY (CATH LAB);  Surgeon: [name], MD;  Location: [facility];  Service: Cardiovascular;  Laterality: Right;   CORONARY STENT INTERVENTION N/A 04/22/2018   Procedure: CORONARY STENT INTERVENTION;  Surgeon: [name], MD;  Location: [facility];  Service: Cardiovascular;  Laterality: N/A;   DILATION AND CURETTAGE OF UTERUS     ESOPHAGEAL MANOMETRY N/A 03/03/2018   Procedure: ESOPHAGEAL MANOMETRY (EM);  Surgeon: Mauri Pole, MD;  Location: [facility];  Service: Endoscopy;  Laterality: N/A;   GASTRIC FUNDOPLICATION     HERNIA REPAIR     HYSTEROSCOPY     fibroids   LAPAROSCOPIC CHOLECYSTECTOMY     LAPAROSCOPY     fibroids   NISSEN FUNDOPLICATION  2993Z   POLYPECTOMY     RIGHT/LEFT HEART CATH AND CORONARY ANGIOGRAPHY N/A 02/20/2018   Procedure: RIGHT/LEFT HEART CATH AND CORONARY ANGIOGRAPHY;  Surgeon: [name], MD;  Location: [place] [facility];  Service: Cardiovascular;  Laterality: N/A;   TEE WITHOUT CARDIOVERSION N/A 07/08/2018   Procedure: TRANSESOPHAGEAL ECHOCARDIOGRAM (TEE);  Surgeon: Burnell Blanks, MD;  Location: [facility];  Service: Open Heart Surgery;  Laterality: N/A;   TEE WITHOUT CARDIOVERSION   10/07/2018   TEE WITHOUT CARDIOVERSION N/A 10/07/2018   Procedure: TRANSESOPHAGEAL ECHOCARDIOGRAM (TEE);  Surgeon: Jerline Pain, MD;  Location: [facility];  Service: Cardiovascular;  Laterality: N/A;   TONSILLECTOMY     TRANSCATHETER AORTIC VALVE REPLACEMENT, TRANSFEMORAL N/A 07/08/2018   Procedure: TRANSCATHETER AORTIC VALVE REPLACEMENT, TRANSFEMORAL;  Surgeon: Burnell Blanks, MD;  Location: [place] [facility];  Service: Open Heart Surgery;  Laterality: N/A;    Social History:  Social History   Socioeconomic History   Marital status: Married    Spouse name: Not on file   Number of children: 2   Years of education: Not on file   Highest education level: Not on file  Occupational History   Occupation: Tree surgeon of the [employer]  Tobacco Use   Smoking status: Never   Smokeless tobacco: Never  Vaping Use   Vaping Use: Never used  Substance and Sexual Activity   Alcohol use: No    Alcohol/week: 0.0 standard drinks   Drug use: Never   Sexual activity:  Not Currently  Other Topics Concern   Not on file  Social History Narrative   Lives with husband in a one story home.     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   Education: college.   Social Determinants of Health   Financial Resource Strain: Low Risk    Difficulty of Paying Living Expenses: Not hard at all  Food Insecurity: No Food Insecurity   Worried About Charity fundraiser in the Last Year: Never true   [place] in the Last Year: Never true  Transportation Needs: No Transportation Needs   Lack of Transportation (Medical): No   Lack of Transportation (Non-Medical): No  Physical Activity: Insufficiently Active   Days of Exercise per Week: 2 days   Minutes of Exercise per Session: 50 min  Stress: Stress Concern Present   Feeling of Stress : To some extent  Social Connections: Engineer, building services of Communication  with Friends and Family: More than three times a week   Frequency of Social Gatherings with Friends and Family: Once a week   Attends Religious Services: More than 4 times per year   Active Member of [employer] or Organizations: Yes   Attends Music therapist: More than 4 times per year   Marital Status: Married  Human resources officer Violence: Not At Risk   Fear of Current or Ex-Partner: No   Emotionally Abused: No   Physically Abused: No   Sexually Abused: No    Family History:  Family History  Adopted: Yes  Problem Relation Age of Onset   Healthy Son        x 2   Headache Other        Cluster headaches   Heart failure Mother    Colon cancer Neg Hx    Pancreatic cancer Neg Hx    Rectal cancer Neg Hx    Stomach cancer Neg Hx     Medications:   Current Outpatient Medications on File Prior to Visit  Medication Sig Dispense Refill   BD INSULIN SYRINGE U/F 31G X 5/16" 1 ML MISC FOUR TIMES A DAY UNDER THE SKIN 200 each 1   cholecalciferol (VITAMIN D) 25 MCG (1000 UNIT) tablet Take 1,000 Units by mouth daily.     diltiazem (CARDIZEM CD) 120 MG 24 hr capsule TAKE ONE CAPSULE BY MOUTH DAILY 90 capsule 1   ELIQUIS 5 MG TABS tablet TAKE ONE TABLET BY MOUTH TWICE A DAY 60 tablet 2   escitalopram (LEXAPRO) 20 MG tablet Take 20 mg by mouth daily.     Lancets (ONETOUCH DELICA PLUS [id number]) MISC USE TO CHECK BLOOD SUGAR TWICE DAILY.. 200 each 0   losartan (COZAAR) 50 MG tablet Take 50 mg by mouth daily.     metFORMIN (GLUCOPHAGE-XR) 500 MG 24 hr tablet Take 500 mg by mouth 2 (two) times daily.     Multiple Vitamin (MULITIVITAMIN WITH MINERALS) TABS Take 1 tablet by mouth daily.     nitroGLYCERIN (NITROSTAT) 0.4 MG SL tablet Place 1 tablet (0.4 mg total) under the tongue every 5 (five) minutes as needed for chest pain. 25 tablet 3   NOVOLIN N 100 UNIT/ML injection Inject 35 Units into the skin daily before breakfast.     NOVOLIN R 100 UNIT/ML injection 10 UNITS THIRTY MINUTES BEFORE  MEALS.  5 ADDITIONAL UNITS WITH CARBS OR SNACKS.     ONETOUCH VERIO test  strip USE TO MONITOR GLUCOSE LEVELS TWICE DAILY 200 strip 3   SYNTHROID 137 MCG tablet Take 137 mcg by mouth daily.     No current facility-administered medications on file prior to visit.    Allergies:   Allergies  Allergen Reactions   Statins Other (See Comments)    Muscle aches     Physical Exam  There were no vitals filed for this visit.  There is no height or weight on file to calculate BMI. No results found.   General: well developed, well nourished, pleasant elderly Caucasian female, seated, in no evident distress Head: head normocephalic and atraumatic.   Neck: supple with no carotid or supraclavicular bruits Cardiovascular: regular rate and rhythm, no murmurs Musculoskeletal: no deformity Skin:  no rash/petichiae Vascular:  Normal pulses all extremities   Neurologic Exam Mental Status: Awake and fully alert. Fluent speech and language. Oriented to place and time. Recent memory mildly impaired and remote memory intact.  Attention span, concentration and fund of knowledge mostly appropriate. Mood and affect appropriate.  Cranial Nerves: Pupils equal, briskly reactive to light. Extraocular movements full without nystagmus.  Visual fields full to confrontation. Hearing intact. Facial sensation intact.  Mild left lower facial weakness Motor: Normal bulk and tone.  Full strength in all tested extremities except slight decreased left hand dexterity Sensory.: intact to touch , pinprick , position and vibratory sensation.  Coordination: Rapid alternating movements normal in all extremities except slightly decreased left hand. Finger-to-nose showed slight left hand ataxia and heel-to-shin performed accurately bilaterally.  Occasional left hand tremor but not consistently with action or at rest.  Negative for cogwheel rigidity or bradykinesia. Gait and Station: Arises from chair with mild difficulty. Stance is  normal. Gait demonstrates  normal stride length and balance without use of assistive device. Able to heel toe with mild difficulty.  Romberg negative. Reflexes: 1+ and symmetric. Toes downgoing.       ASSESSMENT: Breanna Webster is a 75 y.o. year old female presented with increased shortness of breath and confusion x2 weeks along with headache on 10/21/2018 with recent admission on 10/03/2018 for sepsis and mitral valve endocarditis.  Stroke work-up revealed multifocal bilateral anterior and posterior infarcts cardioembolic pattern secondary to new onset A. fib versus recent endocarditis. Vascular risk factors include history of DVT, aortic stenosis status post TAVR 06/2018, pulmonary hypertension, HTN, DM, CAD status post stenting 03/2018, recent endocarditis, A. fib and new diagnosis of moderate OSA with initiation of BiPAP.     PLAN:  Hx multiple Cardiologic strokes including cerebellar and pontine stroke:  Residual deficits: mild LUE decreased dexterity with intermittent tremor and cognitive impairment.  Discussion again regarding intermittent tremor likely from prior stroke.  Symptoms do not interfere with daily activity or functioning therefore no indication to trial medications especially when risk outweighs benefit.  Discussed use of weighted wristband for possible benefit. Continue Eliquis (apixaban) daily  for secondary stroke prevention.   Discussed secondary stroke prevention measures and importance of close PCP/endocrinology follow-up for aggressive stroke risk factor management including HTN with BP goal<130/90 and DM with A1c goal<7 Atrial fibrillation: On Eliquis 5 mg twice daily routinely monitored by cardiology Moderate OSA:  Followed by Dr. Brett Fairy with most recent visit 08/03/2020 - c/o persistent daytime fatigue despite adequately treated sleep apnea Sleep study 03/29/2020 AHI 12.8, severely altered highly fragmented sleep associated with sleep stages or arousals from sleep,  delayed onset REM sleep with REM sleep associated with continuous low oxygen saturation with total  O2 desat time 70 minutes with SPO2 nadir 76%, and mild periodic limb movement disorder CPAP titration 04/21/2020 apnea unresponsive to hypopneas, apnea and desaturations but OSA responded partially to BiPAP 14/10 with central sleep apnea present with higher BiPAP pressures, sleep-related hypoxemia improved greatly on CPAP and BiPAP, presence of mild PLM confirmed Recommendation: Auto BiPAP with a 4cm pressure spread from 13/9 through 18/14 Discussed importance of nightly use as this will greatly reduce stroke risk factors, cardiovascular risk factors and also likely improve daytime fatigue   Schedule follow-up in 6 weeks for initial CPAP compliance visit Plan to f/u in 6 months after that time for stroke and CPAP compliance visit   CC:  [facility] provider: Dr. Kathleen Lime, Tommi Rumps, NP    I spent 40 minutes of face-to-face and non-face-to-face time with patient and husband.  This included previsit chart review, lab review, study review, order entry, electronic health record documentation, patient education regarding prior history of stroke and etiology, residual deficits, new diagnosis of sleep apnea and use of BiPAP machine and answered all other related questions, importance of managing stroke risk factors, and answered all other multiple questions to patient and husband satisfaction  [name], AGNP-BC  [name] [address] [place], [place] [address]  Phone [phone] Fax [phone] Note: This document was prepared with digital dictation and possible smart phrase technology. Any transcriptional errors that result from this process are unintentional.

## 2020-12-08 ENCOUNTER — Encounter: Payer: Self-pay | Admitting: Adult Health

## 2020-12-08 ENCOUNTER — Telehealth (INDEPENDENT_AMBULATORY_CARE_PROVIDER_SITE_OTHER): Payer: Medicare Other | Admitting: Adult Health

## 2020-12-08 VITALS — HR 76 | Temp 97.7°F | Ht 65.0 in | Wt 179.0 lb

## 2020-12-08 DIAGNOSIS — I2510 Atherosclerotic heart disease of native coronary artery without angina pectoris: Secondary | ICD-10-CM | POA: Diagnosis not present

## 2020-12-08 DIAGNOSIS — U071 COVID-19: Secondary | ICD-10-CM | POA: Diagnosis not present

## 2020-12-08 MED ORDER — MOLNUPIRAVIR EUA 200MG CAPSULE: 4.0000 | ORAL_CAPSULE | Freq: Two times a day (BID) | ORAL | 0 refills | Status: AC

## 2020-12-08 NOTE — Progress Notes (Signed)
Virtual Visit via Video Note  I connected with Breanna Webster on 12/08/20 at 11:00 AM EDT by a video enabled telemedicine application and verified that I am speaking with the correct person using two identifiers.  Location patient: home Location provider:work or home office Persons participating in the virtual visit: patient, provider  I discussed the limitations of evaluation and management by telemedicine and the availability of in person appointments. The patient expressed understanding and agreed to proceed.   HPI: 75 year old female who  has a past medical history of Anxiety, Arthritis, Back pain, Blood transfusion without reported diagnosis, CAD (coronary artery disease), Carotid artery stenosis, Chest pain, Chronic lower back pain, Cirrhosis ([place]), Colon polyps, Diverticulitis, Diverticulosis, Esophageal thickening, Fatty liver, GERD (gastroesophageal reflux disease), Grave's disease, History of colonic polyps (05/22/2017), History of hiatal hernia, Hypertension, Hypothyroidism, IBS (irritable bowel syndrome), Osteopenia, Pulmonary nodules, S/P TAVR (transcatheter aortic valve replacement), Severe aortic stenosis, Shortness of breath on exertion, Stroke ([place]), Thalassemia minor, Thyroid disease, and Type II diabetes mellitus ([place]).  75 year old female who is being evaluated today for an acute issue.  Symptoms started roughly 4 to 5 days ago after she got off a cruise ship.  Symptoms include sore throat for 3 days, productive cough with green mucus, and sinus drainage.  She denies fevers, chills, body aches, or headaches.   ROS: See pertinent positives and negatives per HPI.  Past Medical History:  Diagnosis Date   Anxiety    Arthritis    "back" (04/22/2018)   Back pain    Blood transfusion without reported diagnosis    CAD (coronary artery disease)    a. 03/2018 s/p PCI/DES to the RCA (3.0x15 Onyx DES).   Carotid artery stenosis    Mild   Chest pain    Chronic lower back pain     Cirrhosis ([facility])    Colon polyps    Diverticulitis    Diverticulosis    Esophageal thickening    seen on pre TAVR CT scan, also questionable cirrhosis. MRI recommended. Will refer to GI   Fatty liver    GERD (gastroesophageal reflux disease)    Grave's disease    History of colonic polyps 05/22/2017   History of hiatal hernia    Hypertension    Hypothyroidism    IBS (irritable bowel syndrome)    Osteopenia    Pulmonary nodules    seen on pre TAVR CT. likley benign. no follow up recommended if pt low risk.   S/P TAVR (transcatheter aortic valve replacement)    Severe aortic stenosis    Shortness of breath on exertion    Stroke ([place])    Thalassemia minor    Thyroid disease    Type II diabetes mellitus ([place])     Past Surgical History:  Procedure Laterality Date   30 HOUR [place] STUDY N/A 03/03/2018   Procedure: 24 HOUR [place] STUDY;  Surgeon: Mauri Pole, MD;  Location: [facility];  Service: Endoscopy;  Laterality: N/A;   COLONOSCOPY     COLONOSCOPY W/ BIOPSIES AND POLYPECTOMY     CORONARY ANGIOGRAPHY Right 04/21/2018   Procedure: CORONARY ANGIOGRAPHY (CATH LAB);  Surgeon: [name], MD;  Location: [facility];  Service: Cardiovascular;  Laterality: Right;   CORONARY STENT INTERVENTION N/A 04/22/2018   Procedure: CORONARY STENT INTERVENTION;  Surgeon: [name], MD;  Location: [facility];  Service: Cardiovascular;  Laterality: N/A;   DILATION AND CURETTAGE OF UTERUS     ESOPHAGEAL MANOMETRY  N/A 03/03/2018   Procedure: ESOPHAGEAL MANOMETRY (EM);  Surgeon: Mauri Pole, MD;  Location: [facility];  Service: Endoscopy;  Laterality: N/A;   GASTRIC FUNDOPLICATION     HERNIA REPAIR     HYSTEROSCOPY     fibroids   LAPAROSCOPIC CHOLECYSTECTOMY     LAPAROSCOPY     fibroids   NISSEN FUNDOPLICATION  7169C   POLYPECTOMY     RIGHT/LEFT HEART CATH AND CORONARY ANGIOGRAPHY N/A 02/20/2018   Procedure: RIGHT/LEFT HEART CATH AND CORONARY ANGIOGRAPHY;   Surgeon: [name], MD;  Location: [facility];  Service: Cardiovascular;  Laterality: N/A;   TEE WITHOUT CARDIOVERSION N/A 07/08/2018   Procedure: TRANSESOPHAGEAL ECHOCARDIOGRAM (TEE);  Surgeon: Burnell Blanks, MD;  Location: [facility];  Service: Open Heart Surgery;  Laterality: N/A;   TEE WITHOUT CARDIOVERSION  10/07/2018   TEE WITHOUT CARDIOVERSION N/A 10/07/2018   Procedure: TRANSESOPHAGEAL ECHOCARDIOGRAM (TEE);  Surgeon: Jerline Pain, MD;  Location: [facility];  Service: Cardiovascular;  Laterality: N/A;   TONSILLECTOMY     TRANSCATHETER AORTIC VALVE REPLACEMENT, TRANSFEMORAL N/A 07/08/2018   Procedure: TRANSCATHETER AORTIC VALVE REPLACEMENT, TRANSFEMORAL;  Surgeon: Burnell Blanks, MD;  Location: [facility];  Service: Open Heart Surgery;  Laterality: N/A;    Family History  Adopted: Yes  Problem Relation Age of Onset   Healthy Son        x 2   Headache Other        Cluster headaches   Heart failure Mother    Colon cancer Neg Hx    Pancreatic cancer Neg Hx    Rectal cancer Neg Hx    Stomach cancer Neg Hx        Current Outpatient Medications:    BD INSULIN SYRINGE U/F 31G X 5/16" 1 ML MISC, FOUR TIMES A DAY UNDER THE SKIN, Disp: 200 each, Rfl: 1   cholecalciferol (VITAMIN D) 25 MCG (1000 UNIT) tablet, Take 1,000 Units by mouth daily., Disp: , Rfl:    diltiazem (CARDIZEM CD) 120 MG 24 hr capsule, TAKE ONE CAPSULE BY MOUTH DAILY, Disp: 90 capsule, Rfl: 1   ELIQUIS 5 MG TABS tablet, TAKE ONE TABLET BY MOUTH TWICE A DAY, Disp: 60 tablet, Rfl: 2   escitalopram (LEXAPRO) 20 MG tablet, Take 20 mg by mouth daily., Disp: , Rfl:    Lancets (ONETOUCH DELICA PLUS [id number]) MISC, USE TO CHECK BLOOD SUGAR TWICE DAILY.., Disp: 200 each, Rfl: 0   losartan (COZAAR) 50 MG tablet, Take 50 mg by mouth daily., Disp: , Rfl:    metFORMIN (GLUCOPHAGE-XR) 500 MG 24 hr tablet, Take 500 mg by mouth 2 (two) times daily., Disp: , Rfl:    Multiple Vitamin (MULITIVITAMIN WITH  MINERALS) TABS, Take 1 tablet by mouth daily., Disp: , Rfl:    nitroGLYCERIN (NITROSTAT) 0.4 MG SL tablet, Place 1 tablet (0.4 mg total) under the tongue every 5 (five) minutes as needed for chest pain., Disp: 25 tablet, Rfl: 3   NOVOLIN N 100 UNIT/ML injection, Inject 35 Units into the skin daily before breakfast., Disp: , Rfl:    NOVOLIN R 100 UNIT/ML injection, 10 UNITS THIRTY MINUTES BEFORE MEALS.  5 ADDITIONAL UNITS WITH CARBS OR SNACKS., Disp: , Rfl:    ONETOUCH VERIO test strip, USE TO MONITOR GLUCOSE LEVELS TWICE DAILY, Disp: 200 strip, Rfl: 3   SYNTHROID 137 MCG tablet, Take 137 mcg by mouth daily., Disp: , Rfl:   EXAM:  VITALS per patient if applicable:  GENERAL: alert, oriented,  appears well and in no acute distress  HEENT: atraumatic, conjunttiva clear, no obvious abnormalities on inspection of external nose and ears  NECK: normal movements of the head and neck  LUNGS: on inspection no signs of respiratory distress, breathing rate appears normal, no obvious gross SOB, gasping or wheezing  CV: no obvious cyanosis  MS: moves all visible extremities without noticeable abnormality  PSYCH/NEURO: pleasant and cooperative, no obvious depression or anxiety, speech and thought processing grossly intact  ASSESSMENT AND PLAN:  Discussed the following assessment and plan:  1. COVID-19 virus infection -She took a home COVID test during this video conference and ended up testing positive.  Due to significant medical history will place her on oral antiviral therapy.  Reviewed side effects with the patient - molnupiravir EUA 200 mg CAPS; Take 4 capsules (800 mg total) by mouth 2 (two) times daily for 5 days.  Dispense: 40 capsule; Refill: 0   I discussed the assessment and treatment plan with the patient. The patient was provided an opportunity to ask questions and all were answered. The patient agreed with the plan and demonstrated an understanding of the instructions.   The patient  was advised to call back or seek an in-person evaluation if the symptoms worsen or if the condition fails to improve as anticipated.   [name], NP

## 2020-12-14 ENCOUNTER — Telehealth: Payer: Self-pay | Admitting: Adult Health

## 2020-12-14 NOTE — Telephone Encounter (Signed)
Patient spouse [name]) brought in paperwork that he needs Tommi Rumps to fill out. Paperwork will be placed in folder.  Patient would like to be reached at [phone] when completed.  Please advise.

## 2020-12-14 NOTE — Telephone Encounter (Signed)
This has been taking care of. Pt spouse received PPW.

## 2020-12-19 ENCOUNTER — Ambulatory Visit (INDEPENDENT_AMBULATORY_CARE_PROVIDER_SITE_OTHER): Payer: Medicare Other

## 2020-12-19 DIAGNOSIS — Z794 Long term (current) use of insulin: Secondary | ICD-10-CM | POA: Diagnosis not present

## 2020-12-19 DIAGNOSIS — I480 Paroxysmal atrial fibrillation: Secondary | ICD-10-CM | POA: Diagnosis not present

## 2020-12-19 DIAGNOSIS — E1169 Type 2 diabetes mellitus with other specified complication: Secondary | ICD-10-CM

## 2020-12-19 DIAGNOSIS — I10 Essential (primary) hypertension: Secondary | ICD-10-CM | POA: Diagnosis not present

## 2020-12-19 DIAGNOSIS — Z8673 Personal history of transient ischemic attack (TIA), and cerebral infarction without residual deficits: Secondary | ICD-10-CM

## 2020-12-19 DIAGNOSIS — E785 Hyperlipidemia, unspecified: Secondary | ICD-10-CM | POA: Diagnosis not present

## 2020-12-19 DIAGNOSIS — R350 Frequency of micturition: Secondary | ICD-10-CM

## 2020-12-19 NOTE — Patient Instructions (Signed)
Visit Information  PATIENT GOALS:  Goals Addressed             This Visit's Progress    RNCM:Keep Skin Clean and Dry   On track    Timeframe:  Long-Range Goal Priority:  High Start Date:   10/03/20                          Expected End Date:  03/23/21                     Follow Up Date 01/31/21    - clean and dry skin well - keep skin dry - use a fragrance-free lotion on skin - wear a protective pad or garment    Why is this important?   Leaking urine (pee) can cause soreness from skin rashes and redness.  This is caused by skin being exposed to urine (pee).    Notes:      RNCM:Monitor and Manage My Blood Sugar   On track    Timeframe:  Long-Range Goal Priority:  Medium Start Date: 10/03/20                            Expected End Date: 03/23/21                       Follow Up Date 01/31/21   - check blood sugar at prescribed times - check blood sugar if I feel it is too high or too low - enter blood sugar readings and medication or insulin into daily log - take the blood sugar log to all doctor visits - take the blood sugar meter to all doctor visits    Why is this important?   Checking your blood sugar at home helps to keep it from getting very high or very low.  Writing the results in a diary or log helps the doctor know how to care for you.  Your blood sugar log should have the time, date and the results.  Also, write down the amount of insulin or other medicine that you take.  Other information, like what you ate, exercise done and how you were feeling, will also be helpful.     Notes:      RNCM:Track and Manage My Blood Pressure-Hypertension   On track    Timeframe:  Long-Range Goal Priority:  Medium Start Date:      10/03/20                       Expected End Date:  03/23/21                    Follow Up Date 01/31/21    - check blood pressure 3 times per week - choose a place to take my blood pressure (home, clinic or office, retail store) - write blood  pressure results in a log or diary    Why is this important?   You won't feel high blood pressure, but it can still hurt your blood vessels.  High blood pressure can cause heart or kidney problems. It can also cause a stroke.  Making lifestyle changes like losing a little weight or eating less salt will help.  Checking your blood pressure at home and at different times of the day can help to control blood pressure.  If the doctor prescribes medicine  remember to take it the way the doctor ordered.  Call the office if you cannot afford the medicine or if there are questions about it.     Notes:         Patient verbalizes understanding of instructions provided today and agrees to view in [place].   Telephone follow up appointment with care management team member scheduled for: 01/31/21 at 11:30 AM  Peter Garter RN, Jackquline Denmark, CDE Care Management Coordinator [facility] [phone], Mobile [phone]

## 2020-12-19 NOTE — Chronic Care Management (AMB) (Signed)
Chronic Care Management   CCM RN Visit Note  12/19/2020 Name: Breanna Webster MRN: [id number] DOB: 1945/08/25  Subjective: Breanna Webster is a 75 y.o. year old female who is a primary care patient of [name], NP. The care management team was consulted for assistance with disease management and care coordination needs.    Engaged with patient by telephone for follow up visit in response to provider referral for case management and/or care coordination services.   Consent to Services:  The patient was given information about Chronic Care Management services, agreed to services, and gave verbal consent prior to initiation of services.  Please see initial visit note for detailed documentation.   Patient agreed to services and verbal consent obtained.   Assessment: Review of patient past medical history, allergies, medications, health status, including review of consultants reports, laboratory and other test data, was performed as part of comprehensive evaluation and provision of chronic care management services.   SDOH (Social Determinants of Health) assessments and interventions performed:    CCM Care Plan  Allergies  Allergen Reactions   Statins Other (See Comments)    Muscle aches    Outpatient Encounter Medications as of 12/19/2020  Medication Sig   BD INSULIN SYRINGE U/F 31G X 5/16" 1 ML MISC FOUR TIMES A DAY UNDER THE SKIN   cholecalciferol (VITAMIN D) 25 MCG (1000 UNIT) tablet Take 1,000 Units by mouth daily.   diltiazem (CARDIZEM CD) 120 MG 24 hr capsule TAKE ONE CAPSULE BY MOUTH DAILY   ELIQUIS 5 MG TABS tablet TAKE ONE TABLET BY MOUTH TWICE A DAY   escitalopram (LEXAPRO) 20 MG tablet Take 20 mg by mouth daily.   Lancets (ONETOUCH DELICA PLUS UXNATF57D) MISC USE TO CHECK BLOOD SUGAR TWICE DAILY.Marland Kitchen   losartan (COZAAR) 50 MG tablet Take 50 mg by mouth daily.   metFORMIN (GLUCOPHAGE-XR) 500 MG 24 hr tablet Take 500 mg by mouth 2 (two) times daily.   Multiple Vitamin  (MULITIVITAMIN WITH MINERALS) TABS Take 1 tablet by mouth daily.   nitroGLYCERIN (NITROSTAT) 0.4 MG SL tablet Place 1 tablet (0.4 mg total) under the tongue every 5 (five) minutes as needed for chest pain.   NOVOLIN N 100 UNIT/ML injection Inject 35 Units into the skin daily before breakfast.   NOVOLIN R 100 UNIT/ML injection 10 UNITS THIRTY MINUTES BEFORE MEALS.  5 ADDITIONAL UNITS WITH CARBS OR SNACKS.   ONETOUCH VERIO test strip USE TO MONITOR GLUCOSE LEVELS TWICE DAILY   SYNTHROID 137 MCG tablet Take 137 mcg by mouth daily.   No facility-administered encounter medications on file as of 12/19/2020.    Patient Active Problem List   Diagnosis Date Noted   Sepsis secondary to UTI ([place]) 09/08/2020   Elevated ALT measurement 09/08/2020   History of CVA with residual deficit 09/08/2020   Hyperbilirubinemia 09/08/2020   Fatigue associated with anemia 08/03/2020   Cerebrovascular accident (CVA) due to embolism of right posterior cerebral artery ([place]) 08/03/2020   OSA treated with BiPAP 08/03/2020   Complex sleep apnea syndrome 08/03/2020   Treatment-emergent central sleep apnea 08/03/2020   Chronic intermittent hypoxia with obstructive sleep apnea 04/21/2020   OSA (obstructive sleep apnea) 04/21/2020   History of cardioembolic stroke 22/05/5425   Gait disturbance, post-stroke 03/29/2020   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 ([place]) 03/29/2020   Anxiety    RLQ abdominal pain 10/22/2019   Paroxysmal atrial fibrillation ([place]) 05/22/2019   Iron deficiency anemia 05/07/2019   Atrial fibrillation with RVR (  [place]) 10/21/2018   Cerebellar stroke, acute ([place]) 10/21/2018   Streptococcal endocarditis    Endocarditis of mitral valve 10/07/2018   Bacteremia due to Streptococcus Salivarius 10/07/2018   Sepsis ([place]) 66/59/9357   Acute metabolic encephalopathy 01/77/9390   Severe aortic stenosis 07/08/2018   S/P TAVR (transcatheter aortic valve replacement) 07/08/2018   Esophageal  thickening    CAD in native artery 04/22/2018   Gastroesophageal reflux disease    Pulmonary hypertension ([place]) 02/21/2018   Essential hypertension 07/15/2017   History of colonic polyps 05/22/2017   Elevated liver function tests 12/05/2016   DOE (dyspnea on exertion) 07/19/2016   Thalassemia minor 05/29/2016   Left bundle branch block 12/06/2015   Upper airway cough syndrome 10/14/2015   Myalgia 02/17/2014   Carotid artery stenosis 06/05/2013   Eustachian tube dysfunction 05/07/2013   Neuropathy of leg 03/07/2012   Anemia, unspecified 01/31/2012   Hot flashes 08/24/2010   Diabetes mellitus due to underlying condition, uncontrolled ([place]) 06/30/2010   Generalized abdominal pain 03/24/2010   Obesity (BMI 30.0-34.9) 12/21/2009   IBS (irritable bowel syndrome) 04/29/2009   Vitamin D deficiency 03/10/2009   Hypothyroidism 12/13/2008   Dyslipidemia 12/13/2008   Anxiety state 12/13/2008   Other specified disorders of bladder 12/13/2008    Conditions to be addressed/monitored:Atrial Fibrillation, HTN, HLD, DMII, and frequent UTI  Care Plan : RNCM:Diabetes Type 2 (Adult)  Updates made by [name], RN since 12/19/2020 12:00 AM     Problem: Lack of Long Term plan for self managment of Type 2 DM   Priority: Medium     Long-Range Goal: Effective long term self management of Type 2 DM   Start Date: 10/03/2020  Expected End Date: 03/23/2021  This Visit's Progress: On track  Recent Progress: On track  Priority: Medium  Note:   Objective:  Lab Results  Component Value Date   HGBA1C 7.4 (H) 09/08/2020   Lab Results  Component Value Date   CREATININE 0.67 09/16/2020   CREATININE 0.74 09/11/2020   CREATININE 0.77 09/10/2020   No results found for: EGFR Current Barriers:  Knowledge Deficits related to basic Diabetes pathophysiology and self care/management Knowledge Deficits related to medications used for management of diabetes Unable to independently self manage Type 2  DM Unable to perform IADLs independently-Husband assists with transportation  Pt states that she her blood sugars have been in range most of the time.  States she did have a 64 when she over slept at her nap  States she saw Dr. [name] 12/12/20 and her A1C was 6.9%.  Reports following a low CHO diet.  States her husband has been helping her as needed.  States she caught COVID after her cruise but she is recovering now Case Manager Clinical Goal(s):  patient will demonstrate improved adherence to prescribed treatment plan for diabetes self care/management as evidenced by: daily monitoring and recording of CBG  adherence to ADA/ carb modified diet adherence to prescribed medication regimen contacting provider for new or worsened symptoms or questions Interventions:  Collaboration with [name], NP regarding development and update of comprehensive plan of care as evidenced by provider attestation and co-signature Inter-disciplinary care team collaboration (see longitudinal plan of care) Provided education to patient about basic DM disease process Reviewed medications with patient and discussed importance of medication adherence Discussed plans with patient for ongoing care management follow up and provided patient with direct contact information for care management team Reinforced s/sx hypoglycemia and hyperglycemia and importance of correct treatment Reviewed scheduled/upcoming  provider appointments including: no upcoming with primary care Urology 12/20/20, Neurology 12/28/20 Reinforced to check cbg before meals and record, calling endocrinologist or primary care provider   for findings outside established parameters.   Review of patient status, including review of consultants reports, relevant laboratory and other test results, and medications completed. Self-Care Activities - Self administers oral medications as prescribed Self administers insulin as prescribed Attends all scheduled provider  appointments Checks blood sugars as prescribed and utilize hyper and hypoglycemia protocol as needed Adheres to prescribed ADA/carb modified Patient Goals: - check blood sugar at prescribed times - check blood sugar if I feel it is too high or too low - enter blood sugar readings and medication or insulin into daily log - take the blood sugar log to all doctor visits - change to whole grain breads, cereal, pasta - drink 6 to 8 glasses of water each day - fill half of plate with vegetables - manage portion size - read food labels for fat, fiber, carbohydrates and portion size - switch to sugar-free drinks - keep appointment with eye doctor - check feet daily for cuts, sores or redness Follow Up Plan: Telephone follow up appointment with care management team member scheduled for: 01/31/21 at 11:30 AM The patient has been provided with contact information for the care management team and has been advised to call with any health related questions or concerns.      Care Plan : RNCM:Urinary Incontinence (Adult)  Updates made by [name], RN since 12/19/2020 12:00 AM     Problem: Symptom Management (Urinary Incontinence)   Priority: High     Long-Range Goal: Urinary Incontinence Symptoms Manged   Start Date: 10/03/2020  Expected End Date: 03/23/2021  This Visit's Progress: On track  Recent Progress: On track  Priority: High  Note:   Current Barriers:  Ineffective Self Health Maintenance of urinary incontinence with hx of UTI, DM2, CAD, hx of CVA, atrial fib,  anemia Unable to independently self manage urinary incontinence Unable to perform IADLs independently-husband provides transportation to appointments States that she is still having nocturia 1-2 a night and she has urgency if she does not get to bathroom soon enough States she is to see Dr. [name] on 12/20/20.  States she had another UTI after coming back from her cruise.   Clinical Goal(s):  Collaboration with  [name], NP regarding development and update of comprehensive plan of care as evidenced by provider attestation and co-signature Inter-disciplinary care team collaboration (see longitudinal plan of care) patient will work with care management team to address care coordination and chronic disease management needs related to Disease Management Educational Needs Care Coordination   Interventions:  Evaluation of current treatment plan related to CAD, HTN, HLD, DMII, Anxiety, and hx CVA, atrial fib , Transportation, ADL IADL limitations, and Inability to perform IADL's independently self-management and patient's adherence to plan as established by provider. Collaboration with [name], NP regarding development and update of comprehensive plan of care as evidenced by provider attestation       and co-signature Inter-disciplinary care team collaboration (see longitudinal plan of care) Discussed plans with patient for ongoing care management follow up and provided patient with direct contact information for care management team Reinforced on s/sx UTI and when to call MD Reinforced to try doing pelvic floor exercises when she feels urgency and to void every 2 hours during the day Reinforced to keep perineal area clean and dry Reinforced to keep urology appointment on 12/20/20  with Dr. [name] and encourgaed to discuss treatment options for incontinence and overactive bladder Self Care Activities:  Patient verbalizes understanding of plan to self manage urinary incontinence Self administers medications as prescribed Attends all scheduled provider appointments Calls pharmacy for medication refills Calls provider office for new concerns or questions Patient Goals: - clean and dry skin well - keep skin dry - use a fragrance-free lotion on skin - wear a protective pad or garment Follow Up Plan: Telephone follow up appointment with care management team member scheduled for: 01/31/21 at 11:30  AM The patient has been provided with contact information for the care management team and has been advised to call with any health related questions or concerns.      Care Plan : RNCM:Cardiovascular Disease Management(HTN, CAD, Atrial fibrillation)  Updates made by [name], RN since 12/19/2020 12:00 AM     Problem: Lack of long term management of Cardiovascular Disease(HTN, CAD, Atrial fibrillation   Priority: Medium     Long-Range Goal: Effective Cardiovascular Disease Self  Management(HTN, CAD, Atrial fibrillation)   Start Date: 10/03/2020  Expected End Date: 03/23/2021  This Visit's Progress: On track  Recent Progress: On track  Priority: Medium  Note:   Current Barriers:  Knowledge deficits related to self health management of Cardiovascular Disease Management(HTN, CAD, Atrial fibrillation)  Knowledge Deficits related to Self management of Cardiovascular Disease Management(HTN, CAD, Atrial fibrillation Care Coordination needs related to disease management  in a patient with Self management of Cardiovascular Disease Management(HTN, CAD, Atrial fibrillation Chronic Disease Management support and education needs related to Self management of Cardiovascular Disease Management(HTN, CAD, Atrial fibrillation Unable to independently Self management of Cardiovascular Disease Management(HTN, CAD, Atrial fibrillation Unable to perform IADLs independently States her B/P has been good with readings around 120-130/70-80, States she is still having fatigue.  States she has been having issues with her CPAP and plans to discuss with Dr. Brett Fairy at her next visit on 12/28/20.  No reports of chest pain or shortness of breath.  Nurse Case Manager Clinical Goal(s):  patient will take all medications exactly as prescribed and will call provider for medication related questions patient will verbalize understanding of Afib Action Plan and when to call doctor patient will verbalize understanding of  plan for Self management of Cardiovascular Disease Management(HTN, CAD, Atrial fibrillation patient will meet with RN Care Manager to address Self management of Cardiovascular Disease Management(HTN, CAD, Atrial fibrillation patient will take all medications exactly as prescribed and will call provider for medication related questions patient will attend all scheduled medical appointments: no upcoming primary care provider scheduled, urology 12/20/20, neurology 12/28/20 patient will verbalize basic understanding of Self management of Cardiovascular Disease Management(HTN, CAD, Atrial fibrillation disease process and self health management plan as evidenced by verbalizing understanding of education, voiced adherence to treatment plan the patient will demonstrate ongoing self health care management ability as evidenced by voiced understanding of treatment plan and decrease in hospitalizations * Interventions:  Collaboration with [name], NP regarding development and update of comprehensive plan of care as evidenced by provider attestation and co-signature Inter-disciplinary care team collaboration (see longitudinal plan of care) Basic overview and discussion of HTN, CAD, Atrial fibrillation Medications reviewed Afib action plan reviewed Evaluation of current treatment plan related to Self management of Cardiovascular Disease Management(HTN, CAD, Atrial fibrillation and patient's adherence to plan as established by provider. Reinforced to discuss issues with Dr. Brett Fairy concerning her CPAP Reviewed education  re: HTN, CAD, Atrial fibrillation Reviewed medications  with patient and discussed adherence Reinforced to monitor blood pressure daily and record, calling provider for findings outside established parameters.  Discussed plans with patient for ongoing care management follow up and provided patient with direct contact information for care management team Self-Care Activities: Self administers  medications as prescribed Attends all scheduled provider appointments Calls pharmacy for medication refills Calls provider office for new concerns or questions Patient Goals: - begin a symptom diary - make a plan to eat healthy - keep all lab appointments - take medicine as prescribed - check blood pressure 3 times per week - choose a place to take my blood pressure (home, clinic or office, retail store) - write blood pressure results in a log or diary Follow Up Plan: Telephone follow up appointment with care management team member scheduled for: 01/31/21 at 11:30 AM The patient has been provided with contact information for the care management team and has been advised to call with any health related questions or concerns.       Plan:Telephone follow up appointment with care management team member scheduled for:  01/31/21 and The patient has been provided with contact information for the care management team and has been advised to call with any health related questions or concerns.  Peter Garter RN, Jackquline Denmark, CDE Care Management Coordinator [facility] [phone], Mobile [phone]

## 2020-12-21 ENCOUNTER — Telehealth: Payer: Self-pay | Admitting: Gastroenterology

## 2020-12-21 DIAGNOSIS — N3000 Acute cystitis without hematuria: Secondary | ICD-10-CM | POA: Diagnosis not present

## 2020-12-21 DIAGNOSIS — N3941 Urge incontinence: Secondary | ICD-10-CM | POA: Diagnosis not present

## 2020-12-21 DIAGNOSIS — N3281 Overactive bladder: Secondary | ICD-10-CM | POA: Diagnosis not present

## 2020-12-21 DIAGNOSIS — N952 Postmenopausal atrophic vaginitis: Secondary | ICD-10-CM | POA: Diagnosis not present

## 2020-12-21 NOTE — Telephone Encounter (Signed)
Left message on machine to call back  

## 2020-12-22 ENCOUNTER — Other Ambulatory Visit: Payer: Self-pay | Admitting: Adult Health

## 2020-12-22 DIAGNOSIS — Z760 Encounter for issue of repeat prescription: Secondary | ICD-10-CM

## 2020-12-22 NOTE — Telephone Encounter (Signed)
Left message on machine to call back  

## 2020-12-22 NOTE — Telephone Encounter (Signed)
Patient returned call. Also stated her GERD is acting up

## 2020-12-23 NOTE — Telephone Encounter (Signed)
The pt states that she has reflux that is interrupting her sleep.  She was last seen 7/21 for abd pain. She is not currently taking any PPI.  She was given the antireflux precautions and told she can try OTC omeprazole and pepcid as well.  She has been scheduled to see Nicoletta Ba on 9/6 at 11 am.  The pt has been advised of the information and verbalized understanding.

## 2020-12-27 ENCOUNTER — Other Ambulatory Visit (INDEPENDENT_AMBULATORY_CARE_PROVIDER_SITE_OTHER): Payer: Medicare Other

## 2020-12-27 ENCOUNTER — Encounter: Payer: Self-pay | Admitting: Physician Assistant

## 2020-12-27 ENCOUNTER — Encounter: Payer: Self-pay | Admitting: Neurology

## 2020-12-27 ENCOUNTER — Ambulatory Visit (INDEPENDENT_AMBULATORY_CARE_PROVIDER_SITE_OTHER): Payer: Medicare Other | Admitting: Physician Assistant

## 2020-12-27 VITALS — BP 126/60 | HR 82 | Ht 65.0 in | Wt 176.0 lb

## 2020-12-27 DIAGNOSIS — K7460 Unspecified cirrhosis of liver: Secondary | ICD-10-CM

## 2020-12-27 DIAGNOSIS — R059 Cough, unspecified: Secondary | ICD-10-CM

## 2020-12-27 DIAGNOSIS — Z86010 Personal history of colonic polyps: Secondary | ICD-10-CM

## 2020-12-27 DIAGNOSIS — K2270 Barrett's esophagus without dysplasia: Secondary | ICD-10-CM

## 2020-12-27 DIAGNOSIS — K219 Gastro-esophageal reflux disease without esophagitis: Secondary | ICD-10-CM

## 2020-12-27 DIAGNOSIS — I2510 Atherosclerotic heart disease of native coronary artery without angina pectoris: Secondary | ICD-10-CM | POA: Diagnosis not present

## 2020-12-27 LAB — PROTIME-INR
INR: 1.6 ratio — ABNORMAL HIGH (ref 0.8–1.0)
Prothrombin Time: 17.5 s — ABNORMAL HIGH (ref 9.6–13.1)

## 2020-12-27 LAB — HEPATIC FUNCTION PANEL
ALT: 36 U/L — ABNORMAL HIGH (ref 0–35)
AST: 28 U/L (ref 0–37)
Albumin: 4.1 g/dL (ref 3.5–5.2)
Alkaline Phosphatase: 72 U/L (ref 39–117)
Bilirubin, Direct: 0.2 mg/dL (ref 0.0–0.3)
Total Bilirubin: 0.9 mg/dL (ref 0.2–1.2)
Total Protein: 7.2 g/dL (ref 6.0–8.3)

## 2020-12-27 MED ORDER — FAMOTIDINE 20 MG PO TABS: 20.0000 mg | ORAL_TABLET | Freq: Every day | ORAL | 0 refills | Status: DC

## 2020-12-27 MED ORDER — OMEPRAZOLE 20 MG PO CPDR
20.0000 mg | DELAYED_RELEASE_CAPSULE | Freq: Two times a day (BID) | ORAL | 3 refills | Status: DC
Start: 2020-12-27 — End: 2021-04-06

## 2020-12-27 NOTE — Patient Instructions (Signed)
If you are age 77 or older, your body mass index should be between 23-30. Your Body mass index is 29.29 kg/m. If this is out of the aforementioned range listed, please consider follow up with your Primary Care Provider. __________________________________________________________  The [place] [facility] providers would like to encourage you to use [facility] to communicate with providers for non-urgent requests or questions.  Due to long hold times on the telephone, sending your provider a message by [facility] may be a faster and more efficient way to get a response.  Please allow 48 business hours for a response.  Please remember that this is for non-urgent requests.   You have been scheduled for an abdominal ultrasound at [facility] (1st floor of hospital) on 01/04/2021 at 10:00 am. Please arrive 15 minutes prior to your appointment for registration. Make certain not to have anything to eat or drink 6 hours prior to your appointment. Should you need to reschedule your appointment, please contact radiology at [phone]. This test typically takes about 30 minutes to perform.  Your provider has requested that you go to the basement level for lab work before leaving today. Press "B" on the elevator. The lab is located at the first door on the left as you exit the elevator.  Take Omeprazole 20 mg 1 capsule before meals twice daily Take Famotidine 20 mg 1 tablet at bedtime for 1 month  You have been scheduled to follow up with Dr. Havery Moros on March 01, 2021 at 10:30 am.  Thank you for entrusting me with your care and choosing [facility].  Amy [name], PA-C  Food Choices for Gastroesophageal Reflux Disease, Adult When you have gastroesophageal reflux disease (GERD), the foods you eat and your eating habits are very important. Choosing the right foods can help ease your discomfort. Think about working with a food expert (dietitian) to help you make good choices. What are tips for  following this plan? Reading food labels Look for foods that are low in saturated fat. Foods that may help with your symptoms include: Foods that have less than 5% of daily value (DV) of fat. Foods that have 0 grams of trans fat. Cooking Do not fry your food. Cook your food by baking, steaming, grilling, or broiling. These are all methods that do not need a lot of fat for cooking. To add flavor, try to use herbs that are low in spice and acidity. Meal planning  Choose healthy foods that are low in fat, such as: Fruits and vegetables. Whole grains. Low-fat dairy products. Lean meats, fish, and poultry. Eat small meals often instead of eating 3 large meals each day. Eat your meals slowly in a place where you are relaxed. Avoid bending over or lying down until 2-3 hours after eating. Limit high-fat foods such as fatty meats or fried foods. Limit your intake of fatty foods, such as oils, butter, and shortening. Avoid the following as told by your doctor: Foods that cause symptoms. These may be different for different people. Keep a food diary to keep track of foods that cause symptoms. Alcohol. Drinking a lot of liquid with meals. Eating meals during the 2-3 hours before bed. Lifestyle Stay at a healthy weight. Ask your doctor what weight is healthy for you. If you need to lose weight, work with your doctor to do so safely. Exercise for at least 30 minutes on 5 or more days each week, or as told by your doctor. Wear loose-fitting clothes. Do not smoke or  use any products that contain nicotine or tobacco. If you need help quitting, ask your doctor. Sleep with the head of your bed higher than your feet. Use a wedge under the mattress or blocks under the bed frame to raise the head of the bed. Chew sugar-free gum after meals. What foods should eat? Eat a healthy, well-balanced diet of fruits, vegetables, whole grains, low-fat dairy products, lean meats, fish, and poultry. Each person is  different. Foods that may cause symptoms in one person may not cause any symptoms in another person. Work with your doctor to find foods that are safe for you. The items listed above may not be a complete list of what you can eat and drink. Contact a food expert for more options. What foods should I avoid? Limiting some of these foods may help in managing the symptoms of GERD. Everyone is different. Talk with a food expert or your doctor to help you find the exact foods to avoid, if any. Fruits Any fruits prepared with added fat. Any fruits that cause symptoms. For some people, this may include citrus fruits, such as oranges, grapefruit, pineapple, and lemons. Vegetables Deep-fried vegetables. [place] fries. Any vegetables prepared with added fat. Any vegetables that cause symptoms. For some people, this may include tomatoes and tomato products, chili peppers, onions and garlic, and horseradish. Grains Pastries or quick breads with added fat. Meats and other proteins High-fat meats, such as fatty beef or pork, hot dogs, ribs, ham, sausage, salami, and bacon. Fried meat or protein, including fried fish and fried chicken. Nuts and nut butters, in large amounts. Dairy Whole milk and chocolate milk. Sour cream. Cream. Ice cream. Cream cheese. Milkshakes. Fats and oils Butter. Margarine. Shortening. Ghee. Beverages Coffee and tea, with or without caffeine. Carbonated beverages. Sodas. Energy drinks. Fruit juice made with acidic fruits, such as orange or grapefruit. Tomato juice. Alcoholic drinks. Sweets and desserts Chocolate and cocoa. Donuts. Seasonings and condiments Pepper. Peppermint and spearmint. Added salt. Any condiments, herbs, or seasonings that cause symptoms. For some people, this may include curry, hot sauce, or vinegar-based salad dressings. The items listed above may not be a complete list of what you should not eat and drink. Contact a food expert for more options. Questions to ask  your doctor Diet and lifestyle changes are often the first steps that are taken to manage symptoms of GERD. If diet and lifestyle changes do not help, talk with your doctor about taking medicines. Where to find more information International Foundation for Gastrointestinal Disorders: aboutgerd.org Summary When you have GERD, food and lifestyle choices are very important in easing your symptoms. Eat small meals often instead of 3 large meals a day. Eat your meals slowly and in a place where you are relaxed. Avoid bending over or lying down until 2-3 hours after eating. Limit high-fat foods such as fatty meats or fried foods. This information is not intended to replace advice given to you by your health care provider. Make sure you discuss any questions you have with your health care provider. Document Revised: 10/19/2019 Document Reviewed: 10/19/2019 Elsevier Patient Education  [place].

## 2020-12-28 ENCOUNTER — Encounter: Payer: Self-pay | Admitting: Physician Assistant

## 2020-12-28 ENCOUNTER — Encounter: Payer: Self-pay | Admitting: Neurology

## 2020-12-28 ENCOUNTER — Ambulatory Visit (INDEPENDENT_AMBULATORY_CARE_PROVIDER_SITE_OTHER): Payer: Medicare Other | Admitting: Neurology

## 2020-12-28 VITALS — BP 116/74 | HR 75 | Ht 65.0 in | Wt 178.5 lb

## 2020-12-28 DIAGNOSIS — G933 Postviral fatigue syndrome: Secondary | ICD-10-CM

## 2020-12-28 DIAGNOSIS — I63431 Cerebral infarction due to embolism of right posterior cerebral artery: Secondary | ICD-10-CM | POA: Diagnosis not present

## 2020-12-28 DIAGNOSIS — I2510 Atherosclerotic heart disease of native coronary artery without angina pectoris: Secondary | ICD-10-CM | POA: Diagnosis not present

## 2020-12-28 DIAGNOSIS — U071 COVID-19: Secondary | ICD-10-CM

## 2020-12-28 DIAGNOSIS — G9331 Postviral fatigue syndrome: Secondary | ICD-10-CM

## 2020-12-28 DIAGNOSIS — I480 Paroxysmal atrial fibrillation: Secondary | ICD-10-CM | POA: Diagnosis not present

## 2020-12-28 DIAGNOSIS — Z789 Other specified health status: Secondary | ICD-10-CM | POA: Diagnosis not present

## 2020-12-28 LAB — AFP TUMOR MARKER: AFP-Tumor Marker: 3.1 ng/mL

## 2020-12-28 NOTE — Patient Instructions (Signed)
Understanding Your Risk for Falls Each year, millions of people have serious injuries from falls. It is important to understand your risk for falling. Talk with your health care provider about your risk and what you can do to lower it. There are actions you can take athome to lower your risk and prevent falls. If you do have a serious fall, make sure to tell your health care provider.Falling once raises your risk of falling again. How can falls affect me? Serious injuries from falls are common. These include: Broken bones, such as hip fractures. Head injuries, such as traumatic brain injuries (TBI) or concussion. A fear of falling can cause you to avoid activities and stay at home. This canmake your muscles weaker and actually raise your risk for a fall. What can increase my risk? There are a number of risk factors that increase your risk for falling. The more risk factors you have, the higher your risk of falling. Serious injuries from a fall happen most often to people older than age 65. Children and youngadults ages 15-29 are also at higher risk. Common risk factors include: Weakness in the lower body. Lack (deficiency) of vitamin D. Being generally weak or confused due to long-term (chronic) illness. Dizziness or balance problems. Poor vision. Medicines that cause dizziness or drowsiness. These can include medicines for your blood pressure, heart, anxiety, insomnia, or edema, as well as pain medicines and muscle relaxants. Other risk factors include: Drinking alcohol. Having had a fall in the past. Having depression. Having foot pain or wearing improper footwear. Working at a dangerous job. Having any of the following in your home: Tripping hazards, such as floor clutter or loose rugs. Poor lighting. Pets. Having dementia or memory loss. What actions can I take to lower my risk of falling?     Physical activity Maintain physical fitness. Do strength and balance exercises.  Consider taking aregular class to build strength and balance. Yoga and tai chi are good options. Vision Have your eyes checked every year and your vision prescription updated asneeded. Walking aids and footwear Wear nonskid shoes. Do not wear high heels. Do not walk around the house in socks or slippers. Use a cane or walker as told by your health care provider. Home safety Attach secure railings on both sides of your stairs. Install grab bars for your tub, shower, and toilet. Use a bath mat in your tub or shower. Use good lighting in all rooms. Keep a flashlight near your bed. Make sure there is a clear path from your bed to the bathroom. Use night-lights. Do not use throw rugs. Make sure all carpeting is taped or tacked down securely. Remove all clutter from walkways and stairways, including extension cords. Repair uneven or broken steps. Avoid walking on icy or slippery surfaces. Walk on the grass instead of on icy or slick sidewalks. Use ice melt to get rid of ice on walkways. Use a cordless phone. Questions to ask your health care provider Can you help me check my risk for a fall? Do any of my medicines make me more likely to fall? Should I take a vitamin D supplement? What exercises can I do to improve my strength and balance? Should I make an appointment to have my vision checked? Do I need a bone density test to check for weak bones or osteoporosis? Would it help to use a cane or a walker? Where to find more information Centers for Disease Control and Prevention, [employer]AD[employer]: www.cdc.gov Community-Based Fall Prevention Programs:   www.cdc.gov National Institute on Aging: www.nia.nih.gov Contact a health care provider if: You fall at home. You are afraid of falling at home. You feel weak, drowsy, or dizzy. Summary Serious injuries from a fall happen most often to people older than age 65. Children and young adults ages 15-29 are also at higher risk. Talk with your health care  provider about your risks for falling and how to lower those risks. Taking certain precautions at home can lower your risk for falling. If you fall, always tell your health care provider. This information is not intended to replace advice given to you by your health care provider. Make sure you discuss any questions you have with your healthcare provider. Document Revised: 11/11/2019 Document Reviewed: 11/11/2019 Elsevier Patient Education  2022 Elsevier Inc.  

## 2020-12-28 NOTE — Progress Notes (Signed)
Subjective:    Patient ID: Breanna Webster, female    DOB: January 31, 1946, 75 y.o.   MRN: [id number]  HPI Breanna Webster is a 75 year old white female, established with Dr. Havery Moros with history of chronic GERD, Barrett's esophagus, history of adenomatous colon polyps and compensated cirrhosis felt secondary to nonalcoholic fatty liver disease.  Patient also with multiple other comorbidities including prior CVA x2, on chronic Eliquis, hypertension, coronary artery disease, history of atrial fibrillation, prior history of endocarditis she is now status post aortic valve replacement, sleep apnea complex with BiPAP use, IBS, adult onset diabetes mellitus, thalassemia and history of iron deficiency anemia. Patient comes in today with concerns about exacerbation of GERD symptoms.  She was diagnosed with COVID-19 about 15 days ago, and says she had a lot of cough and sputum production as well as postnasal drip during her illness.  Around that same time she started having problems with GERD, with throat clearing and fullness.  She feels that most of her symptoms are secondary to GERD, and focuses on this rather than recent COVID.  She has not been on any acid blockers over the past couple of years.  She had called here and was asked to start on omeprazole 20 mg p.o. every morning and Pepcid 20 mg at bedtime.  She brought medications with her today but seems somewhat confused about which medication to take when.  She has not noticed much change in symptoms.  She denies any dysphagia or odynophagia.  No complaints of abdominal pain.  Last EGD October 2020 showed prior Nissen fundoplication intact with irregular Z-line, she had a 3 mm polyp removed from the duodenal bulb which was benign and biopsies of the esophagus showed Barrett's esophagus without dysplasia. She also had colonoscopy at that same time with removal of 8 polyps all of which were adenomatous and is indicated for 3-year interval follow-up.  Review of Systems  Pertinent positive and negative review of systems were noted in the above HPI section.  All other review of systems was otherwise negative.   Outpatient Encounter Medications as of 12/27/2020  Medication Sig   BD INSULIN SYRINGE U/F 31G X 5/16" 1 ML MISC FOUR TIMES A DAY UNDER THE SKIN   cholecalciferol (VITAMIN D) 25 MCG (1000 UNIT) tablet Take 1,000 Units by mouth daily.   diltiazem (CARDIZEM CD) 120 MG 24 hr capsule TAKE ONE CAPSULE BY MOUTH DAILY   ELIQUIS 5 MG TABS tablet TAKE ONE TABLET BY MOUTH TWICE A DAY   escitalopram (LEXAPRO) 20 MG tablet Take 20 mg by mouth daily.   Lancets (ONETOUCH DELICA PLUS HERDEY81K) MISC USE TO CHECK BLOOD SUGAR TWICE DAILY.Marland Kitchen   losartan (COZAAR) 50 MG tablet Take 50 mg by mouth daily.   metFORMIN (GLUCOPHAGE-XR) 500 MG 24 hr tablet Take 500 mg by mouth 2 (two) times daily.   Multiple Vitamin (MULITIVITAMIN WITH MINERALS) TABS Take 1 tablet by mouth daily.   OneTouch Delica Lancets 48J MISC Tests BS 2 times   [DISCONTINUED] famotidine (PEPCID) 20 MG tablet Take 20 mg by mouth 2 (two) times daily.   [DISCONTINUED] omeprazole (PRILOSEC) 20 MG capsule Take 20 mg by mouth daily.   BD PEN NEEDLE NANO 2ND GEN 32G X 4 MM MISC    famotidine (PEPCID) 20 MG tablet Take 1 tablet (20 mg total) by mouth at bedtime.   levothyroxine (SYNTHROID) 137 MCG tablet Take 1 mcg by mouth once.   nitroGLYCERIN (NITROSTAT) 0.4 MG SL tablet Place 1 tablet (0.4 mg  total) under the tongue every 5 (five) minutes as needed for chest pain. (Patient taking differently: Place 0.4 mg under the tongue as needed for chest pain.)   NOVOLIN N 100 UNIT/ML injection Inject 28 Units into the skin daily before breakfast.   NOVOLIN R 100 UNIT/ML injection 8 Units. 10 UNITS THIRTY MINUTES BEFORE MEALS.  5 ADDITIONAL UNITS WITH CARBS OR SNACKS.   omeprazole (PRILOSEC) 20 MG capsule Take 1 capsule (20 mg total) by mouth 2 (two) times daily before a meal.   ONETOUCH VERIO test strip USE TO MONITOR GLUCOSE  LEVELS TWICE DAILY   [DISCONTINUED] SYNTHROID 137 MCG tablet Take 137 mcg by mouth daily.   No facility-administered encounter medications on file as of 12/27/2020.   Allergies  Allergen Reactions   Statins Other (See Comments)    Muscle aches   Patient Active Problem List   Diagnosis Date Noted   Sepsis secondary to UTI ([place]) 09/08/2020   Elevated ALT measurement 09/08/2020   History of CVA with residual deficit 09/08/2020   Hyperbilirubinemia 09/08/2020   Fatigue associated with anemia 08/03/2020   Cerebrovascular accident (CVA) due to embolism of right posterior cerebral artery ([place]) 08/03/2020   OSA treated with BiPAP 08/03/2020   Complex sleep apnea syndrome 08/03/2020   Treatment-emergent central sleep apnea 08/03/2020   Chronic intermittent hypoxia with obstructive sleep apnea 04/21/2020   OSA (obstructive sleep apnea) 04/21/2020   History of cardioembolic stroke 61/95/0932   Gait disturbance, post-stroke 03/29/2020   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 ([place]) 03/29/2020   Anxiety    RLQ abdominal pain 10/22/2019   Paroxysmal atrial fibrillation ([place]) 05/22/2019   Iron deficiency anemia 05/07/2019   Atrial fibrillation with RVR ([place]) 10/21/2018   Cerebellar stroke, acute ([place]) 10/21/2018   Streptococcal endocarditis    Endocarditis of mitral valve 10/07/2018   Bacteremia due to Streptococcus Salivarius 10/07/2018   Sepsis ([place]) 67/03/4579   Acute metabolic encephalopathy 99/83/3825   Severe aortic stenosis 07/08/2018   S/P TAVR (transcatheter aortic valve replacement) 07/08/2018   Esophageal thickening    CAD in native artery 04/22/2018   Gastroesophageal reflux disease    Pulmonary hypertension ([place]) 02/21/2018   Essential hypertension 07/15/2017   History of colonic polyps 05/22/2017   Elevated liver function tests 12/05/2016   DOE (dyspnea on exertion) 07/19/2016   Thalassemia minor 05/29/2016   Left bundle branch block 12/06/2015   Upper airway  cough syndrome 10/14/2015   Myalgia 02/17/2014   Carotid artery stenosis 06/05/2013   Eustachian tube dysfunction 05/07/2013   Neuropathy of leg 03/07/2012   Anemia, unspecified 01/31/2012   Hot flashes 08/24/2010   Diabetes mellitus due to underlying condition, uncontrolled ([place]) 06/30/2010   Generalized abdominal pain 03/24/2010   Obesity (BMI 30.0-34.9) 12/21/2009   IBS (irritable bowel syndrome) 04/29/2009   Vitamin D deficiency 03/10/2009   Hypothyroidism 12/13/2008   Dyslipidemia 12/13/2008   Anxiety state 12/13/2008   Other specified disorders of bladder 12/13/2008   Social History   Socioeconomic History   Marital status: Married    Spouse name: Not on file   Number of children: 2   Years of education: Not on file   Highest education level: Not on file  Occupational History   Occupation: Tree surgeon of the [employer]  Tobacco Use   Smoking status: Never   Smokeless tobacco: Never  Vaping Use   Vaping Use: Never used  Substance and Sexual Activity   Alcohol use: No    Alcohol/week: 0.0  standard drinks   Drug use: Never   Sexual activity: Not Currently  Other Topics Concern   Not on file  Social History Narrative   Lives with husband in a one story home.     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   Education: college.   Social Determinants of Health   Financial Resource Strain: Low Risk    Difficulty of Paying Living Expenses: Not hard at all  Food Insecurity: No Food Insecurity   Worried About Charity fundraiser in the Last Year: Never true   [place] in the Last Year: Never true  Transportation Needs: No Transportation Needs   Lack of Transportation (Medical): No   Lack of Transportation (Non-Medical): No  Physical Activity: Insufficiently Active   Days of Exercise per Week: 2 days   Minutes of Exercise per Session: 50 min  Stress: Stress Concern Present    Feeling of Stress : To some extent  Social Connections: Engineer, building services of Communication with Friends and Family: More than three times a week   Frequency of Social Gatherings with Friends and Family: Once a week   Attends Religious Services: More than 4 times per year   Active Member of [employer] or Organizations: Yes   Attends Music therapist: More than 4 times per year   Marital Status: Married  Human resources officer Violence: Not At Risk   Fear of Current or Ex-Partner: No   Emotionally Abused: No   Physically Abused: No   Sexually Abused: No    Ms. Eshbach's family history includes Headache in an other family member; Healthy in her son; Heart failure in her mother. She was adopted.      Objective:    Vitals:   12/27/20 1052  BP: 126/60  Pulse: 82    Physical Exam  Well-developed well-nourished elderly female in no acute distress.  Height, Weight, 178 BMI 29.7  HEENT; nontraumatic normocephalic, EOMI, PE R LA, sclera anicteric. Oropharynx; not examined Neck; supple, no JVD Cardiovascular; regular rate and rhythm with S1-S2, no murmur rub or gallop Pulmonary; Clear bilaterally Abdomen; soft, nontender, nondistended, no palpable mass or hepatosplenomegaly, bowel sounds are active Rectal; not done today Skin; benign exam, no jaundice rash or appreciable lesions Extremities; no clubbing cyanosis or edema skin warm and dry Neuro/Psych; alert and oriented x4, grossly nonfocal mood and affect appropriate ,       Assessment & Plan:   #62 75 year old white female with history of chronic GERD and Barrett's esophagus who has been off of medication over the past couple of years and says she had been doing well and was asymptomatic until she was diagnosed with COVID-19 a couple of weeks ago.  This was associated with cough, sputum production postnasal drip and then exacerbation of GERD symptoms with frequent throat clearing, fullness in the throat cough  etc. No dysphagia or odynophagia  #2 history of multiple adenomatous colon polyps-up-to-date with colonoscopy last done October 2020 and indicated for 3-year interval follow-up  #3 status post CVA x2 4.  Atrial fibrillation-on Eliquis 5.  Coronary artery disease 6.  Compensated cirrhosis felt secondary to nonalcoholic fatty liver disease 7.  Status post aortic valve replacement 8.  History of thalassemia and iron deficiency 9.  Adult onset diabetes mellitus  Plan; continue omeprazole but increase to 20 mg p.o. twice daily AC breakfast and AC dinner. Start Pepcid 20 mg  at bedtime Patient was some difficulty trying to remember this regimen and she was reassured that this would be written down for her. Check hepatic panel, AFP and INR Schedule for upper abdominal ultrasound for cirrhosis [facility] surveillance Follow-up office visit with Dr. Havery Moros in 8 weeks  [place] [facility] Genia Harold PA-C 12/28/2020   Cc: [name], NP

## 2020-12-28 NOTE — Progress Notes (Signed)
Agree with assessment and plan as outlined.  

## 2020-12-28 NOTE — Progress Notes (Addendum)
[facility] [address] [place]. [place] [address] [phone]       OFFICE FOLLOW UP NOTE  [name] Date of Birth:  [birth year] Medical Record Number:  [id number]   Reason for Referral: ASV intolerant since COVID, 3 weeks ago. PT and OT discussion.     CHIEF COMPLAINT:  Chief Complaint  Patient presents with   Obstructive Sleep Apnea    Rm 10, alone. Here for 6 month BiPAP f/u. Pt reports not using her machine, states it keeps her up at night. States it makes noise and wakes up w phlegm and cough at times.     HPI:  Today, 12-28-2020, Breanna Webster returns for stroke follow-up alone CD. The patient unfortunately contracted COVID in mid August 2022 so less than a month ago about 3-1/2 weeks ago.  Since then she has been unable to tolerate her BiPAP therapy but she states it was even before that she started to struggle visit more more.  There were a lot of noises on looking back to her CPAP compliance became spotty at 28 July until then she has used the machine over 80% of the time but she often did not make it to 4 hours at night.  The average time of use when using machine was 4 hours and just 8 minutes.  She is using a maximum IPAP of 18 minimum EPAP of 9 and a pressure support of 4 cmH2O so this is an ASV machine.  Adaptive servo ventilation with a residual AHI of 1.6/h.  The apnea index is really low while she is using this machine but the air leaks are high her median and leak her 95th percentile air leakage is 20.3 L/min so the noises are probably at least partially related to air leakage.  So I want her to have a better seal and I want her to return for a attended PSG to see if she still has central sleep apnea.   Reports continued mild left hand tremor with mild left hand weakness -stable without worsening Prior complaints of gait unsteadiness -reports great improvement after working with PT -she has been able to increase ambulation distance - per  husband, able to walk 3-4 blocks without stopping where previously difficulty ambulating very short distances She also reports occasional dizziness typically with position changes that last short duration  She does report in December, she presented to the ED with acute onset right eye visual loss which lasted approximately 15 minutes and then completely resolved.  CTA head/neck unremarkable. Reports evaluation by ophthalmology with no concerning findings that may have possibly contributed.  She has not had any additional visual loss or visual symptoms  Reports compliance on Eliquis 5 mg twice daily -denies bleeding or bruising Glucose levels stable with recent A1c 6.5 (04/2020) Routinely follows with cardiology, PCP and endocrinology  Due to concerns of continued excessive daytime fatigue, she was referred to [facility] for possible repeat sleep study as prior sleep study in 08/2018 was negative for sleep apnea.  Evaluated by Dr. Brett Fairy and underwent sleep study on 03/29/2020 which showed AHI 12.8 with REM sleep associated with continuous low oxygen saturation with total O2 desat time 70 minutes with SPO2 nadir 76% and mild PLM.  Underwent CPAP titration 04/21/2020 with apnea unresponsive to hypopneas, apnea and desaturations but OSA responded partially to BiPAP 14/10 with central sleep apnea present with higher BiPAP pressures, sleep-related hypoxemia improved greatly on CPAP and BiPAP, presence of mild PLM  confirmed.  Recommended initiating auto BiPAP which she started on 2/9.  She appears to be tolerating without difficulty but concerned as she continues to feel daytime fatigue.  No further concerns at this time      History provided for reference purposes only Update 12/21/2019 JM: Breanna Webster returns for stroke follow-up.  She is accompanied by her husband. Residual deficits of left hand tremor, unsteadiness and short-term memory impairment.  She will have occasional dizziness but  typically upon standing from seated position.  Denies any reoccurring diplopia Prior concern of worsening diplopia, imbalance, dizziness and left hand shaking therefore obtain MRI which was negative for acute abnormality She was referred to PT at prior visit but for unknown reason, they were not called to schedule initial evaluation.  She is interested in pursuing PT Denies new stroke/TIA symptoms Remains on Eliquis 5 mg twice daily without bleeding or bruising for secondary stroke prevention and history of atrial fibrillation Blood pressure today 128/64 Glucose levels stable per patient HTN, HLD and DM currently managed by PCP She has multiple other complaints today: She also reports excessive daytime fatigue, insomnia, occasional nocturia and snoring Underwent sleep study approximately 1 year ago which showed mild sleep apnea with AHI 5.0.  Does report worsening daytime fatigue and insomnia and she continues to question accuracy of sleep study results She is currently being evaluated by neurosurgery for right underarm and chest numbness/tingling with concerns of thoracic involvement.  Plans on obtaining MRI cervical and thoracic scheduled on 01/15/2020 No further concerns at this time  Update 09/14/2019 JM: Breanna Webster is a 75 year old female with underlying medical history of DVT history, aortic stenosis s/p TAVR 06/2018, hypothyroidism, pulmonary hypertension, HTN, DM, CAD s/p stenting 03/2018, hx of infective endocarditis 09/2018 and multifocal bilateral anterior posterior infarcts secondary to new onset A. fib in 09/2018.  Patient requests appointment today with concern for intermittent double vision, imbalance, dizziness and left hand shaking.  She is accompanied by her husband.  Previously seen roughly 6 months ago stable from a stroke standpoint and advised to follow-up as needed.  Prior complaints of intermittent diplopia, left hemiparesis and imbalance secondary to multiple cardioembolic  strokes throughout the supratentorial brain, brainstem and cerebellum including multiple lesions within the corpus callosum.  She believes she continued to have intermittent symptoms but believes over the past few months symptoms have worsened or become more frequent.  she did have ophthalmology evaluation on 09/07/2019 with concern of bilateral diplopia worsened with visual tasks with saccadic changes.  Experiences worsening dizziness or diplopia while watching TV for too long.  Also experience dizziness if standing too quickly or rapid head movements.  Symptoms only last for 10 to 20 seconds and then resolve.  Episodes occur 1-2 times per week.  Denies headache, migraine, worsening weakness, numbness/tingling, speech impairment or confusion.  She endorses ongoing compliance with all prescribed medications including Eliquis 5 mg twice daily.  Blood pressure monitored at home which has been stable with today's reading 125/75.  Underlying mild cognitive impairment post stroke which has been stable without worsening.  She questions clearance for driving.  Continues to follow with PCP for HTN, HLD and DM management.  No further concerns at this time.  Update 03/12/2019 JM: Breanna Webster is a 75 year old female who is being seen today for stroke follow-up accompanied by her husband.  She continues to experience balance difficulties, mild left hemiparesis and mild cognitive impairment without much improvement.  Her greatest concern today is ongoing shortness of breath  which has been present since the beginning of this year.  Her shortness of breath severely limits her daily functioning and activity.  Recently seen by Dr. Tamala Julian, her cardiologist, who felt continued shortness of breath was multifactorial but highly encouraged increasing activity and possibly cardiac rehab.  She feels as though "something is being missed" and is overly frustrated.  She also continues to experience excessive daytime fatigue and insomnia at  night.  Prior sleep study negative for OSA.  She continues on Eliquis and aspirin without bleeding or bruising.  Blood pressure today satisfactory at 128/78.  Continues on home Lexapro 20 mg daily and feels as though depression and anxiety stable.  She also continues on amitriptyline 25 mg nightly for prior concerns with headache.  She denies any reoccurring headaches at this time.  No further concerns at this time.  Initial visit 12/10/2018 JM: Residual deficits of mild left hemiparesis, mild left facial droop with occasional speech difficulty, balance difficulties, decreased vision and short-term memory concerns.  She does have difficulty ambulating at times stating her legs feel "rubbery" typically worse worsening in the morning and in the evening.  She is able to ambulate without assistive device.  Prior complaints of left-sided vision with sensation of a shade pulled over her left eye but this is since resolved and recently obtained new prescription glasses by ophthalmology.  She also endorses short-term memory concerns such as forgetting recent question/answer or her husband having to continuously repeat himself.  She denies improvement but her husband states he has seen improvement of her overall ambulation and memory concerns.  She has not received any therapy as this was not recommended at discharge.  She is questioning possible participation in therapy. Previously experiencing left-sided severe headache with recent cessation with continued use of  amitriptyline 25 mg nightly and gabapentin 300 mg nightly.  It was recommended to discontinue gabapentin 300 mg nightly by PCP as she did not gain benefit but she is not aware of this and has continued.  Symptoms previously located left occipital radiating to frontal region with short duration sharp stabbing radiating pain which appears to be secondary to possible occipital neuralgia. Previously underwent sleep study for possible OSA which was  negative Continues on Eliquis and aspirin 81 mg without bleeding or bruising Continues to follow with PCP for DM management Blood pressure today 107/60 States depression/anxiety well controlled with continuation of Lexapro No further concerns at this time  Stroke admission 10/21/2018: Breanna Webster is a 75 y.o. female with history of DVT, aortic stenosis s/p TAVR in March 2020, hypothyroidism, pulmonary hypertension, HTN, CADs/p stenting December 2019 recently admitted for infective endocarditis on the mitral valve treated with 2 weeks gentamicin and ceftriaxone via PEG who presented on 10/21/2018 to the hospital with increased shortness of breath and confusion x2 weeks along with headache.  No focal neurologic symptoms.  New A. Fib with RVR found in the ED and started on heparin drip.  Neurology consulted with work-up revealing multifocal bilateral anterior and posterior infarcts cardioembolic pattern as evidenced on MRI secondary to new onset A. fib versus recent endocarditis.  MRI showed diffuse scattered subcentimeter foci of reduced diffusion and intermediate diffusion throughout the supratentorial brain, brainstem and cerebellum including multiple lesions within the corpus callosum.  Vessel imaging unremarkable with only mild arthrosclerotic bilateral ICA bifurcations.  2D echo normal EF without mention of vegetation.  Recommended initiating D [place] with new onset of A. fib.  HTN stable.  LDL 85 and due  to history of statin allergy with myalgias, statin initiation was not recommended.  A1c 7.5 and recommended follow-up with PCP for uncontrolled DM.  Other stroke risk factors include advanced age, obesity, CAD, MVR and status post TAVR 06/2018.  She was discharged home in stable condition without therapy.  She returned to ED on 11/17/2018 with complaints of sudden onset left-sided headache located left parietal occipital area at the base of her neck.  CTA unremarkable for dissection or other  abnormality.  She did receive 2 migraine cocktails with improvement of headache symptoms.  She has since followed up with her PCP who initiated gabapentin along with continuation of nortriptyline 50 mg nightly.  She apparently did not gain benefit from gabapentin therefore recommended discontinuing gabapentin and nortriptyline by PCP and initiated on amitriptyline.  She did have follow-up with ophthalmologist with concerns of occipital neuralgia.        ROS:   14 system review of systems performed and negative with exception of see HPI  PMH:  Past Medical History:  Diagnosis Date   Anxiety    Arthritis    "back" (04/22/2018)   Back pain    Blood transfusion without reported diagnosis    CAD (coronary artery disease)    a. 03/2018 s/p PCI/DES to the RCA (3.0x15 Onyx DES).   Carotid artery stenosis    Mild   Chest pain    Chronic lower back pain    Cirrhosis (HCC)    Colon polyps    Diverticulitis    Diverticulosis    Esophageal thickening    seen on pre TAVR CT scan, also questionable cirrhosis. MRI recommended. Will refer to GI   Fatty liver    GERD (gastroesophageal reflux disease)    Grave's disease    History of colonic polyps 05/22/2017   History of hiatal hernia    Hypertension    Hypothyroidism    IBS (irritable bowel syndrome)    Osteopenia    Pulmonary nodules    seen on pre TAVR CT. likley benign. no follow up recommended if pt low risk.   S/P TAVR (transcatheter aortic valve replacement)    Severe aortic stenosis    Shortness of breath on exertion    Stroke (HCC)    Thalassemia minor    Thyroid disease    Type II diabetes mellitus (HCC)     PSH:  Past Surgical History:  Procedure Laterality Date   4 HOUR [place] STUDY N/A 03/03/2018   Procedure: 24 HOUR PH STUDY;  Surgeon: Mauri Pole, MD;  Location: [facility];  Service: Endoscopy;  Laterality: N/A;   AORTIC VALVE REPLACEMENT  06/2018   COLONOSCOPY     COLONOSCOPY W/ BIOPSIES AND POLYPECTOMY      CORONARY ANGIOGRAPHY Right 04/21/2018   Procedure: CORONARY ANGIOGRAPHY (CATH LAB);  Surgeon: [name], MD;  Location: [place] [facility];  Service: Cardiovascular;  Laterality: Right;   CORONARY STENT INTERVENTION N/A 04/22/2018   Procedure: CORONARY STENT INTERVENTION;  Surgeon: [name], MD;  Location: [place] [facility];  Service: Cardiovascular;  Laterality: N/A;   DILATION AND CURETTAGE OF UTERUS     ESOPHAGEAL MANOMETRY N/A 03/03/2018   Procedure: ESOPHAGEAL MANOMETRY (EM);  Surgeon: Mauri Pole, MD;  Location: [facility];  Service: Endoscopy;  Laterality: N/A;   GASTRIC FUNDOPLICATION     HERNIA REPAIR     HYSTEROSCOPY     fibroids   LAPAROSCOPIC CHOLECYSTECTOMY     LAPAROSCOPY  fibroids   NISSEN FUNDOPLICATION  [id number]   POLYPECTOMY     RIGHT/LEFT HEART CATH AND CORONARY ANGIOGRAPHY N/A 02/20/2018   Procedure: RIGHT/LEFT HEART CATH AND CORONARY ANGIOGRAPHY;  Surgeon: [name], MD;  Location: [place] [facility];  Service: Cardiovascular;  Laterality: N/A;   TEE WITHOUT CARDIOVERSION N/A 07/08/2018   Procedure: TRANSESOPHAGEAL ECHOCARDIOGRAM (TEE);  Surgeon: Burnell Blanks, MD;  Location: [place];  Service: Open Heart Surgery;  Laterality: N/A;   TEE WITHOUT CARDIOVERSION  10/07/2018   TEE WITHOUT CARDIOVERSION N/A 10/07/2018   Procedure: TRANSESOPHAGEAL ECHOCARDIOGRAM (TEE);  Surgeon: Jerline Pain, MD;  Location: [facility];  Service: Cardiovascular;  Laterality: N/A;   TONSILLECTOMY     TRANSCATHETER AORTIC VALVE REPLACEMENT, TRANSFEMORAL N/A 07/08/2018   Procedure: TRANSCATHETER AORTIC VALVE REPLACEMENT, TRANSFEMORAL;  Surgeon: Burnell Blanks, MD;  Location: [place];  Service: Open Heart Surgery;  Laterality: N/A;    Social History:  Social History   Socioeconomic History   Marital status: Married    Spouse name: Not on file   Number of children: 2   Years of education: Not on file   Highest education level: Not on file   Occupational History   Occupation: Tree surgeon of the [employer]  Tobacco Use   Smoking status: Never   Smokeless tobacco: Never  Vaping Use   Vaping Use: Never used  Substance and Sexual Activity   Alcohol use: No    Alcohol/week: 0.0 standard drinks   Drug use: Never   Sexual activity: Not Currently  Other Topics Concern   Not on file  Social History Narrative   Lives with husband in a one story home.     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   Education: college.   Social Determinants of Health   Financial Resource Strain: Low Risk    Difficulty of Paying Living Expenses: Not hard at all  Food Insecurity: No Food Insecurity   Worried About Charity fundraiser in the Last Year: Never true   [place] in the Last Year: Never true  Transportation Needs: No Transportation Needs   Lack of Transportation (Medical): No   Lack of Transportation (Non-Medical): No  Physical Activity: Insufficiently Active   Days of Exercise per Week: 2 days   Minutes of Exercise per Session: 50 min  Stress: Stress Concern Present   Feeling of Stress : To some extent  Social Connections: Engineer, building services of Communication with Friends and Family: More than three times a week   Frequency of Social Gatherings with Friends and Family: Once a week   Attends Religious Services: More than 4 times per year   Active Member of [employer] or Organizations: Yes   Attends Music therapist: More than 4 times per year   Marital Status: Married  Human resources officer Violence: Not At Risk   Fear of Current or Ex-Partner: No   Emotionally Abused: No   Physically Abused: No   Sexually Abused: No    Family History:  Family History  Adopted: Yes  Problem Relation Age of Onset   Healthy Son        x 2   Headache Other        Cluster headaches   Heart failure Mother    Colon cancer Neg Hx     Pancreatic cancer Neg Hx    Rectal cancer Neg Hx  Stomach cancer Neg Hx     Medications:   Current Outpatient Medications on File Prior to Visit  Medication Sig Dispense Refill   BD INSULIN SYRINGE U/F 31G X 5/16" 1 ML MISC FOUR TIMES A DAY UNDER THE SKIN 200 each 1   BD PEN NEEDLE NANO 2ND GEN 32G X 4 MM MISC      cholecalciferol (VITAMIN D) 25 MCG (1000 UNIT) tablet Take 1,000 Units by mouth daily.     diltiazem (CARDIZEM CD) 120 MG 24 hr capsule TAKE ONE CAPSULE BY MOUTH DAILY 90 capsule 1   ELIQUIS 5 MG TABS tablet TAKE ONE TABLET BY MOUTH TWICE A DAY 60 tablet 2   escitalopram (LEXAPRO) 20 MG tablet Take 20 mg by mouth daily.     famotidine (PEPCID) 20 MG tablet Take 1 tablet (20 mg total) by mouth at bedtime. 30 tablet 0   Lancets (ONETOUCH DELICA PLUS [id number]) MISC USE TO CHECK BLOOD SUGAR TWICE DAILY.. 200 each 0   levothyroxine (SYNTHROID) 137 MCG tablet Take 1 mcg by mouth once.     losartan (COZAAR) 50 MG tablet Take 50 mg by mouth daily.     metFORMIN (GLUCOPHAGE-XR) 500 MG 24 hr tablet Take 500 mg by mouth 2 (two) times daily.     Multiple Vitamin (MULITIVITAMIN WITH MINERALS) TABS Take 1 tablet by mouth daily.     nitroGLYCERIN (NITROSTAT) 0.4 MG SL tablet Place 1 tablet (0.4 mg total) under the tongue every 5 (five) minutes as needed for chest pain. (Patient taking differently: Place 0.4 mg under the tongue as needed for chest pain.) 25 tablet 3   NOVOLIN N 100 UNIT/ML injection Inject 28 Units into the skin daily before breakfast.     NOVOLIN R 100 UNIT/ML injection 8 Units. 10 UNITS THIRTY MINUTES BEFORE MEALS.  5 ADDITIONAL UNITS WITH CARBS OR SNACKS.     omeprazole (PRILOSEC) 20 MG capsule Take 1 capsule (20 mg total) by mouth 2 (two) times daily before a meal. 60 capsule 3   OneTouch Delica Lancets 82S MISC Tests BS 2 times     ONETOUCH VERIO test strip USE TO MONITOR GLUCOSE LEVELS TWICE DAILY 200 strip 3   No current facility-administered medications on file prior  to visit.    Allergies:   Allergies  Allergen Reactions   Statins Other (See Comments)    Muscle aches     Physical Exam  Vitals:   12/28/20 0918  BP: 116/74  Pulse: 75  Weight: 178 lb 8 oz (81 kg)  Height: 5' 5"  (1.651 m)   Body mass index is 29.7 kg/m. No results found.   General: well developed, well nourished, pleasant elderly Caucasian female, seated, in no evident distress Head: head normocephalic and atraumatic.   Neck: supple with no carotid or supraclavicular bruits Cardiovascular: regular rate and rhythm, no murmurs Musculoskeletal: no deformity Skin:  no rash/petichiae Vascular:  Normal pulses all extremities   Neurologic Exam Mental Status: Awake and fully alert. Fluent speech and language. Oriented to place and time. Recent memory mildly impaired and remote memory intact.   Attention span, concentration and fund of knowledge mostly appropriate. Mood and affect appropriate.  Cranial Nerves: Pupils equal, briskly reactive to light. Extraocular movements full without nystagmus.  Visual fields full to confrontation. Hearing intact. Facial sensation intact.  Mild left lower facial weakness Motor: Normal bulk and tone. She has decreased ROM in both shoulders, rotator cuff or stroke,e?  Full strength in all tested extremities except  slight decreased left hand dexterity Sensory.: upper extremities are  intact to touch , pinprick , position and vibratory sensation.  Coordination: Rapid alternating movements normal in all extremities except slightly decreased left hand. Finger-to-nose showed slight left hand ataxia .  Occasional left hand tremor but not consistently with action or at rest.   Negative for cogwheel rigidity or bradykinesia. Gait and Station: " 2 bad knees and a bad hip "Arises from chair with mild difficulty.  Brace herself. Stance is stooped. . Gait demonstrates  normal stride length but reduced balance without use of assistive device. Reflexes: 1+ and  symmetric. Toes downgoing.     ASSESSMENT: NHI Webster is a 75 y.o. year old female presented with increased shortness of breath and confusion x2 weeks along with headache on 10/21/2018 with recent admission on 10/03/2018 for sepsis and mitral valve endocarditis.  Stroke work-up revealed multifocal bilateral anterior and posterior infarcts cardioembolic pattern secondary to new onset A. fib versus recent endocarditis. Vascular risk factors include history of DVT, aortic stenosis status post TAVR 06/2018, pulmonary hypertension, HTN, DM, CAD status post stenting 03/2018, recent endocarditis, A. fib and new diagnosis of mild-moderate OSA with initiation of BiPAP.  COVID 19 infection 3 weeks ago, 12-05-2020, balance problems. She reports diplopia once in a while, decreased energy, not related to apnea , has noted decreased exercise tolerance.   All medications reviewed, she is not on amiodarone.  She is not interested in PT, OT. I ordered it anyway.     PLAN:  Hx multiple Cardiologic strokes including cerebellar and pontine stroke:  Residual deficits: mild LUE decreased dexterity with intermittent tremor and cognitive impairment.  Discussion again regarding intermittent tremor likely from prior stroke.  Symptoms do not interfere with daily activity or functioning therefore no indication to trial medications especially when risk outweighs benefit.  Discussed use of weighted wristband for possible benefit. Continue Eliquis (apixaban) daily  for secondary stroke prevention.   Discussed secondary stroke prevention measures and importance of close PCP/endocrinology follow-up for aggressive stroke risk factor management including HTN with BP goal<130/90 and DM with A1c goal<7 Atrial fibrillation: On Eliquis 5 mg twice daily routinely monitored by cardiology. Recent COVID infection 12-03-2020-  OSA with prolonged hypoxemia: patient cannot tolerate CPAP post COVID, got an infection on family vacation.  She  is more unsteady and more left side weakness. CVA- has fallen forward and to the the left, has trouble getting to her left back pocket,and right as well.   Last Sleep study 03/29/2020 AHI 12.8, severely altered highly fragmented sleep associated with sleep stages or arousals from sleep, delayed onset REM sleep with REM sleep associated with continuous low oxygen saturation with total O2 desat time 70 minutes with SPO2 nadir 76%, and mild periodic limb movement disorder CPAP titration 04/21/2020 apnea unresponsive to hypopneas, apnea and desaturations but OSA responded partially to BiPAP 14/10 with central sleep apnea present with higher BiPAP pressures, sleep-related hypoxemia improved greatly on CPAP and BiPAP, presence of mild PLM confirmed. ASV user now.  Discussed importance of nightly use as this will greatly reduce stroke risk factors, cardiovascular risk factors and also likely improve daytime fatigue. She is currently not able to use. I would much rather retest her and since she was in the past on the [facility] home sleep test I would invite her to the sleep lab my goal is to establish her current baseline and if she has truly mild apnea with hypoxia I may have to refer her to  a pulmonologist to treat the hypoxia rather than the apnea.  I also understand that post-COVID many of my patients were unable to tolerate positive airway pressure therapy at least for several months it did not give him a feeling of being strangled and it often triggers more coughing at night.   Schedule follow-up in 3 month for result of PSG, or retitration,  and if needed oxygen therapy per pulmonology.  PT, OT. I ordered it anyway. I suspect she had a new stroke , her balance is remarkably worse. And there is now intermittent DIPLOPIA . I like Dr Clydene Fake input.  NP McCue plans to f/u in 3 months after that time for stroke and , if still indicated , CPAP compliance visit   CC:  [facility] provider: Dr. Kathleen Lime, Tommi Rumps,  NP    I spent 30 minutes of face-to-face and non-face-to-face time with patient , here alone  This included previsit chart review, lab review, study review, order entry, electronic health record documentation, patient education regarding prior history of stroke and etiology, residual deficits, new diagnosis of post covid viral syndrome, cough, and inability to use CPAP- BiPAP.  sleep apnea and use of BiPAP machine and answered all other related questions, importance of managing stroke risk factors, and answered all other multiple questions to patient and to her satisfaction.  The patient may have had a new stroke given the symptoms she now presented with. A fib risk factor, gait impaired.  Larey Seat, MD   [facility] [address] [place], [place] [address]  Phone [phone] Fax [phone] Note: This document was prepared with digital dictation and possible smart phrase technology. Any transcriptional errors that result from this process are unintentional.

## 2020-12-29 DIAGNOSIS — Z6829 Body mass index (BMI) 29.0-29.9, adult: Secondary | ICD-10-CM | POA: Diagnosis not present

## 2020-12-29 DIAGNOSIS — Z794 Long term (current) use of insulin: Secondary | ICD-10-CM | POA: Diagnosis not present

## 2020-12-29 DIAGNOSIS — Z952 Presence of prosthetic heart valve: Secondary | ICD-10-CM | POA: Diagnosis not present

## 2020-12-29 DIAGNOSIS — E1149 Type 2 diabetes mellitus with other diabetic neurological complication: Secondary | ICD-10-CM | POA: Diagnosis not present

## 2020-12-29 DIAGNOSIS — Z8673 Personal history of transient ischemic attack (TIA), and cerebral infarction without residual deficits: Secondary | ICD-10-CM | POA: Diagnosis not present

## 2020-12-29 DIAGNOSIS — I2510 Atherosclerotic heart disease of native coronary artery without angina pectoris: Secondary | ICD-10-CM | POA: Diagnosis not present

## 2020-12-29 DIAGNOSIS — I4891 Unspecified atrial fibrillation: Secondary | ICD-10-CM | POA: Diagnosis not present

## 2020-12-29 DIAGNOSIS — E039 Hypothyroidism, unspecified: Secondary | ICD-10-CM | POA: Diagnosis not present

## 2020-12-30 ENCOUNTER — Telehealth: Payer: Self-pay | Admitting: Physician Assistant

## 2020-12-30 NOTE — Telephone Encounter (Signed)
Pls call pt, she has questions pertaining to her [name] schedule don 01/03/21.

## 2021-01-02 ENCOUNTER — Telehealth: Payer: Self-pay

## 2021-01-02 ENCOUNTER — Telehealth: Payer: Self-pay | Admitting: Neurology

## 2021-01-02 ENCOUNTER — Telehealth: Payer: Self-pay | Admitting: Hematology

## 2021-01-02 NOTE — Telephone Encounter (Signed)
Spoke with the spouse. He tells me the patient had a bad night last night and she is still asleep. He will have her call again once she is awake.

## 2021-01-02 NOTE — Telephone Encounter (Signed)
LVM for pt to call me back to schedule sleep study  

## 2021-01-02 NOTE — Telephone Encounter (Signed)
I called pt back. She requested for therapy orders placed by Dr. Latrelle Dodrill to be sent to  [facility]  [address] [place], [place] [address].  I advised I would message our patient care coordinators on this.   She also sts she is waiting to hear on scheduling her sleep study. I message Coralyn Pear, B about scheduling and was told she would call her back to set this up.

## 2021-01-02 NOTE — Telephone Encounter (Signed)
Pt called asking about her sleep study. Pt requesting a call back.

## 2021-01-02 NOTE — Telephone Encounter (Signed)
Patient wanted confirmed the u/s appointment.

## 2021-01-02 NOTE — Telephone Encounter (Signed)
Rescheduled upcoming appointment due to provider's template. Patient is aware of changes. 

## 2021-01-03 ENCOUNTER — Ambulatory Visit (HOSPITAL_COMMUNITY)
Admission: RE | Admit: 2021-01-03 | Discharge: 2021-01-03 | Disposition: A | Discharge status: Home / Self Care | Payer: Medicare Other | Source: Ambulatory Visit | Attending: Physician Assistant | Admitting: Physician Assistant

## 2021-01-03 ENCOUNTER — Other Ambulatory Visit: Payer: Self-pay

## 2021-01-03 DIAGNOSIS — R059 Cough, unspecified: Secondary | ICD-10-CM | POA: Diagnosis not present

## 2021-01-03 DIAGNOSIS — K219 Gastro-esophageal reflux disease without esophagitis: Secondary | ICD-10-CM

## 2021-01-03 DIAGNOSIS — K2270 Barrett's esophagus without dysplasia: Secondary | ICD-10-CM | POA: Diagnosis not present

## 2021-01-03 DIAGNOSIS — Z86010 Personal history of colonic polyps: Secondary | ICD-10-CM | POA: Insufficient documentation

## 2021-01-03 DIAGNOSIS — K7460 Unspecified cirrhosis of liver: Secondary | ICD-10-CM | POA: Diagnosis not present

## 2021-01-03 DIAGNOSIS — Z860101 Personal history of adenomatous and serrated colon polyps: Secondary | ICD-10-CM

## 2021-01-03 DIAGNOSIS — R161 Splenomegaly, not elsewhere classified: Secondary | ICD-10-CM | POA: Diagnosis not present

## 2021-01-03 NOTE — Telephone Encounter (Signed)
Referral sent to [facility] PT @ [place] per patient's request. Phone: [phone].

## 2021-01-04 ENCOUNTER — Ambulatory Visit (HOSPITAL_COMMUNITY): Payer: Medicare Other

## 2021-01-04 ENCOUNTER — Telehealth: Payer: Self-pay

## 2021-01-04 NOTE — Telephone Encounter (Signed)
Call from Radiology. U/S of abdomen is in Epic. MRI is recommended. Nicoletta Ba, PA notified.

## 2021-01-05 DIAGNOSIS — N3000 Acute cystitis without hematuria: Secondary | ICD-10-CM | POA: Diagnosis not present

## 2021-01-09 ENCOUNTER — Encounter: Payer: Self-pay | Admitting: Adult Health

## 2021-01-11 ENCOUNTER — Telehealth: Payer: Self-pay | Admitting: Physician Assistant

## 2021-01-11 ENCOUNTER — Other Ambulatory Visit: Payer: Self-pay

## 2021-01-11 ENCOUNTER — Telehealth: Payer: Self-pay | Admitting: Interventional Cardiology

## 2021-01-11 DIAGNOSIS — K769 Liver disease, unspecified: Secondary | ICD-10-CM

## 2021-01-11 DIAGNOSIS — R160 Hepatomegaly, not elsewhere classified: Secondary | ICD-10-CM

## 2021-01-11 NOTE — Telephone Encounter (Signed)
Spouse advised that the imaging has been moved as requested.

## 2021-01-11 NOTE — Telephone Encounter (Signed)
Pt wants to know if she can have imaging at [facility] imaging and can it be an open MRI?

## 2021-01-11 NOTE — Telephone Encounter (Signed)
Pt states she is returning a call from last week, pt states she have been away for a while, she is not sure why she was called. Please advise pt further

## 2021-01-11 NOTE — Telephone Encounter (Signed)
Returned call to patient and advised that I do not see any messages to her from our office. I offered to schedule her echo and office visit which are due in December. Echo is scheduled for 11/29 but there are no appointments with Dr. Tamala Julian available until February. I advised patient that I will forward message to Juliette Mangle for advice regarding appointment and Anderson Malta will call her back at a later time. Patient verbalized understanding and agreement and thanked me for the call.

## 2021-01-12 NOTE — Telephone Encounter (Signed)
Left message to call back  

## 2021-01-16 NOTE — Telephone Encounter (Signed)
Left message to call back  

## 2021-01-17 ENCOUNTER — Telehealth: Payer: Self-pay | Admitting: Physician Assistant

## 2021-01-17 NOTE — Telephone Encounter (Signed)
Inbound call from patient requesting a call back to discuss the length she is to be taking Pepcid and Prilosec

## 2021-01-18 DIAGNOSIS — N3000 Acute cystitis without hematuria: Secondary | ICD-10-CM | POA: Diagnosis not present

## 2021-01-18 DIAGNOSIS — N3281 Overactive bladder: Secondary | ICD-10-CM | POA: Diagnosis not present

## 2021-01-18 NOTE — Telephone Encounter (Signed)
2nd attempt to call patient to contact patient, no answer, unable to leave voicemail.

## 2021-01-18 NOTE — Telephone Encounter (Signed)
Tried to contact patient, no answer unable to leave voicemail

## 2021-01-19 NOTE — Telephone Encounter (Signed)
Left message to call back  

## 2021-01-20 NOTE — Telephone Encounter (Signed)
Final attempt to contact patient, no answer unable to leave voicemail

## 2021-01-23 ENCOUNTER — Other Ambulatory Visit: Payer: Self-pay | Admitting: Physician Assistant

## 2021-01-26 ENCOUNTER — Other Ambulatory Visit: Payer: Self-pay | Admitting: Sports Medicine

## 2021-01-26 DIAGNOSIS — M170 Bilateral primary osteoarthritis of knee: Secondary | ICD-10-CM | POA: Diagnosis not present

## 2021-01-26 DIAGNOSIS — M545 Low back pain, unspecified: Secondary | ICD-10-CM | POA: Diagnosis not present

## 2021-01-26 DIAGNOSIS — S46012A Strain of muscle(s) and tendon(s) of the rotator cuff of left shoulder, initial encounter: Secondary | ICD-10-CM | POA: Diagnosis not present

## 2021-01-26 DIAGNOSIS — M25512 Pain in left shoulder: Secondary | ICD-10-CM

## 2021-01-26 DIAGNOSIS — M5416 Radiculopathy, lumbar region: Secondary | ICD-10-CM

## 2021-01-26 NOTE — Telephone Encounter (Signed)
Left message to call back  I have made multiple attempts to contact pt to get her scheduled.  I will close note and await her call back for appt.

## 2021-01-31 ENCOUNTER — Ambulatory Visit (INDEPENDENT_AMBULATORY_CARE_PROVIDER_SITE_OTHER): Payer: Medicare Other

## 2021-01-31 DIAGNOSIS — E785 Hyperlipidemia, unspecified: Secondary | ICD-10-CM

## 2021-01-31 DIAGNOSIS — R350 Frequency of micturition: Secondary | ICD-10-CM

## 2021-01-31 DIAGNOSIS — E1169 Type 2 diabetes mellitus with other specified complication: Secondary | ICD-10-CM

## 2021-01-31 DIAGNOSIS — Z794 Long term (current) use of insulin: Secondary | ICD-10-CM

## 2021-01-31 DIAGNOSIS — Z8673 Personal history of transient ischemic attack (TIA), and cerebral infarction without residual deficits: Secondary | ICD-10-CM

## 2021-01-31 DIAGNOSIS — I480 Paroxysmal atrial fibrillation: Secondary | ICD-10-CM

## 2021-01-31 DIAGNOSIS — I10 Essential (primary) hypertension: Secondary | ICD-10-CM

## 2021-01-31 NOTE — Patient Instructions (Signed)
Visit Information  PATIENT GOALS:  Goals Addressed             This Visit's Progress    RNCM:Keep Skin Clean and Dry   On track    Timeframe:  Long-Range Goal Priority:  High Start Date:   10/03/20                          Expected End Date:  03/23/21                     Follow Up Date 03/09/21    - clean and dry skin well - keep skin dry - use a fragrance-free lotion on skin - wear a protective pad or garment    Why is this important?   Leaking urine (pee) can cause soreness from skin rashes and redness.  This is caused by skin being exposed to urine (pee).    Notes:      RNCM:Monitor and Manage My Blood Sugar   On track    Timeframe:  Long-Range Goal Priority:  Medium Start Date: 10/03/20                            Expected End Date: 03/23/21                       Follow Up Date 03/09/21   - check blood sugar at prescribed times - check blood sugar if I feel it is too high or too low - enter blood sugar readings and medication or insulin into daily log - take the blood sugar log to all doctor visits - take the blood sugar meter to all doctor visits    Why is this important?   Checking your blood sugar at home helps to keep it from getting very high or very low.  Writing the results in a diary or log helps the doctor know how to care for you.  Your blood sugar log should have the time, date and the results.  Also, write down the amount of insulin or other medicine that you take.  Other information, like what you ate, exercise done and how you were feeling, will also be helpful.     Notes:      RNCM:Track and Manage My Blood Pressure-Hypertension   On track    Timeframe:  Long-Range Goal Priority:  Medium Start Date:      10/03/20                       Expected End Date:  03/23/21                    Follow Up Date 03/09/21    - check blood pressure 3 times per week - choose a place to take my blood pressure (home, clinic or office, retail store) - write blood  pressure results in a log or diary    Why is this important?   You won't feel high blood pressure, but it can still hurt your blood vessels.  High blood pressure can cause heart or kidney problems. It can also cause a stroke.  Making lifestyle changes like losing a little weight or eating less salt will help.  Checking your blood pressure at home and at different times of the day can help to control blood pressure.  If the doctor prescribes medicine  remember to take it the way the doctor ordered.  Call the office if you cannot afford the medicine or if there are questions about it.     Notes:         Patient verbalizes understanding of instructions provided today and agrees to view in [place].   Telephone follow up appointment with care management team member scheduled for: 03/09/21 at 11:30 PM [place], [facility], CDE Care Management Coordinator  [facility] [phone], Mobile [phone]

## 2021-01-31 NOTE — Chronic Care Management (AMB) (Signed)
Chronic Care Management   CCM RN Visit Note  01/31/2021 Name: Breanna Webster MRN: [id number] DOB: [birth year]  Subjective: Breanna Webster is a 75 y.o. year old female who is a primary care patient of [name], NP. The care management team was consulted for assistance with disease management and care coordination needs.    Engaged with patient by telephone for follow up visit in response to provider referral for case management and/or care coordination services.   Consent to Services:  The patient was given information about Chronic Care Management services, agreed to services, and gave verbal consent prior to initiation of services.  Please see initial visit note for detailed documentation.   Patient agreed to services and verbal consent obtained.   Assessment: Review of patient past medical history, allergies, medications, health status, including review of consultants reports, laboratory and other test data, was performed as part of comprehensive evaluation and provision of chronic care management services.   SDOH (Social Determinants of Health) assessments and interventions performed:    CCM Care Plan  Allergies  Allergen Reactions   Statins Other (See Comments)    Muscle aches    Outpatient Encounter Medications as of 01/31/2021  Medication Sig   BD INSULIN SYRINGE U/F 31G X 5/16" 1 ML MISC FOUR TIMES A DAY UNDER THE SKIN   BD PEN NEEDLE NANO 2ND GEN 32G X 4 MM MISC    cholecalciferol (VITAMIN D) 25 MCG (1000 UNIT) tablet Take 1,000 Units by mouth daily.   diltiazem (CARDIZEM CD) 120 MG 24 hr capsule TAKE ONE CAPSULE BY MOUTH DAILY   ELIQUIS 5 MG TABS tablet TAKE ONE TABLET BY MOUTH TWICE A DAY   escitalopram (LEXAPRO) 20 MG tablet Take 20 mg by mouth daily.   famotidine (PEPCID) 20 MG tablet TAKE ONE TABLET BY MOUTH EVERY NIGHT AT BEDTIME   Lancets (ONETOUCH DELICA PLUS [id number]) MISC USE TO CHECK BLOOD SUGAR TWICE DAILY.Marland Kitchen   levothyroxine (SYNTHROID) 137 MCG  tablet Take 1 mcg by mouth once.   losartan (COZAAR) 50 MG tablet Take 50 mg by mouth daily.   metFORMIN (GLUCOPHAGE-XR) 500 MG 24 hr tablet Take 500 mg by mouth 2 (two) times daily.   Multiple Vitamin (MULITIVITAMIN WITH MINERALS) TABS Take 1 tablet by mouth daily.   nitroGLYCERIN (NITROSTAT) 0.4 MG SL tablet Place 1 tablet (0.4 mg total) under the tongue every 5 (five) minutes as needed for chest pain. (Patient taking differently: Place 0.4 mg under the tongue as needed for chest pain.)   NOVOLIN N 100 UNIT/ML injection Inject 28 Units into the skin daily before breakfast.   NOVOLIN R 100 UNIT/ML injection 8 Units. 10 UNITS THIRTY MINUTES BEFORE MEALS.  5 ADDITIONAL UNITS WITH CARBS OR SNACKS.   omeprazole (PRILOSEC) 20 MG capsule Take 1 capsule (20 mg total) by mouth 2 (two) times daily before a meal.   OneTouch Delica Lancets 49F MISC Tests BS 2 times   ONETOUCH VERIO test strip USE TO MONITOR GLUCOSE LEVELS TWICE DAILY   No facility-administered encounter medications on file as of 01/31/2021.    Patient Active Problem List   Diagnosis Date Noted   Sepsis secondary to UTI ([place]) 09/08/2020   Elevated ALT measurement 09/08/2020   History of CVA with residual deficit 09/08/2020   Hyperbilirubinemia 09/08/2020   Fatigue associated with anemia 08/03/2020   Cerebrovascular accident (CVA) due to embolism of right posterior cerebral artery ([place]) 08/03/2020   OSA treated with BiPAP 08/03/2020   Complex sleep  apnea syndrome 08/03/2020   Treatment-emergent central sleep apnea 08/03/2020   Chronic intermittent hypoxia with obstructive sleep apnea 04/21/2020   OSA (obstructive sleep apnea) 04/21/2020   History of cardioembolic stroke 09/32/3557   Gait disturbance, post-stroke 03/29/2020   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 ([place]) 03/29/2020   Anxiety    RLQ abdominal pain 10/22/2019   Paroxysmal atrial fibrillation ([place]) 05/22/2019   Iron deficiency anemia 05/07/2019   Atrial  fibrillation with RVR ([place]) 10/21/2018   Cerebellar stroke, acute ([place]) 10/21/2018   Streptococcal endocarditis    Endocarditis of mitral valve 10/07/2018   Bacteremia due to Streptococcus Salivarius 10/07/2018   Sepsis ([place]) 32/20/2542   Acute metabolic encephalopathy 70/62/3762   Severe aortic stenosis 07/08/2018   S/P TAVR (transcatheter aortic valve replacement) 07/08/2018   Esophageal thickening    CAD in native artery 04/22/2018   Gastroesophageal reflux disease    Pulmonary hypertension ([place]) 02/21/2018   Essential hypertension 07/15/2017   History of colonic polyps 05/22/2017   Elevated liver function tests 12/05/2016   DOE (dyspnea on exertion) 07/19/2016   Thalassemia minor 05/29/2016   Left bundle branch block 12/06/2015   Upper airway cough syndrome 10/14/2015   Myalgia 02/17/2014   Carotid artery stenosis 06/05/2013   Eustachian tube dysfunction 05/07/2013   Neuropathy of leg 03/07/2012   Anemia, unspecified 01/31/2012   Hot flashes 08/24/2010   Diabetes mellitus due to underlying condition, uncontrolled 06/30/2010   Generalized abdominal pain 03/24/2010   Obesity (BMI 30.0-34.9) 12/21/2009   IBS (irritable bowel syndrome) 04/29/2009   Vitamin D deficiency 03/10/2009   Hypothyroidism 12/13/2008   Dyslipidemia 12/13/2008   Anxiety state 12/13/2008   Other specified disorders of bladder 12/13/2008    Conditions to be addressed/monitored:Atrial Fibrillation, CAD, HTN, HLD, DMII, and hx CVA, frequent UTI, OSA  Care Plan : RNCM:Diabetes Type 2 (Adult)  Updates made by [name], RN since 01/31/2021 12:00 AM     Problem: Lack of Long Term plan for self managment of Type 2 DM   Priority: Medium     Long-Range Goal: Effective long term self management of Type 2 DM   Start Date: 10/03/2020  Expected End Date: 03/23/2021  This Visit's Progress: On track  Recent Progress: On track  Priority: Medium  Note:   Objective:  Lab Results  Component Value Date    HGBA1C 7.4 (H) 09/08/2020   Lab Results  Component Value Date   CREATININE 0.67 09/16/2020   CREATININE 0.74 09/11/2020   CREATININE 0.77 09/10/2020   No results found for: EGFR Current Barriers:  Knowledge Deficits related to basic Diabetes pathophysiology and self care/management Knowledge Deficits related to medications used for management of diabetes Unable to independently self manage Type 2 DM Unable to perform IADLs independently-Husband assists with transportation  Pt states that she her blood sugars have been up some this last week with readings up to 200.  States she has not changed her diet but she did think she and her husband had a mild cold/virus this last week  States she is to see Dr. [name] 02/28/21. States her last A1C was 6.9%. Denies any recent hypoglycemia Reports following a low CHO diet.  States her husband has been helping her as needed.  States she did have a fall that injured her shoulder Case Manager Clinical Goal(s):  patient will demonstrate improved adherence to prescribed treatment plan for diabetes self care/management as evidenced by: daily monitoring and recording of CBG  adherence to ADA/ carb  modified diet adherence to prescribed medication regimen contacting provider for new or worsened symptoms or questions Interventions:  Collaboration with [name], NP regarding development and update of comprehensive plan of care as evidenced by provider attestation and co-signature Inter-disciplinary care team collaboration (see longitudinal plan of care) Provided education to patient about basic DM disease process Reviewed medications with patient and discussed importance of medication adherence Discussed plans with patient for ongoing care management follow up and provided patient with direct contact information for care management team Reinforced s/sx hypoglycemia and hyperglycemia and importance of correct treatment Reviewed scheduled/upcoming provider  appointments including: no upcoming primary care provider scheduled, sleep study 02/11/21, GI 03/02/21, Dr. [name] 02/28/21 neurology 05/18/21 Reinforced to check cbg before meals and record, calling endocrinologist or primary care provider   for findings outside established parameters.   Review of patient status, including review of consultants reports, relevant laboratory and other test results, and medications completed. Reviewed importance of increased activity and fall prevention Self-Care Activities - Self administers oral medications as prescribed Self administers insulin as prescribed Attends all scheduled provider appointments Checks blood sugars as prescribed and utilize hyper and hypoglycemia protocol as needed Adheres to prescribed ADA/carb modified Patient Goals: - check blood sugar at prescribed times - check blood sugar if I feel it is too high or too low - enter blood sugar readings and medication or insulin into daily log - take the blood sugar log to all doctor visits - change to whole grain breads, cereal, pasta - drink 6 to 8 glasses of water each day - fill half of plate with vegetables - manage portion size - read food labels for fat, fiber, carbohydrates and portion size - switch to sugar-free drinks - keep appointment with eye doctor - check feet daily for cuts, sores or redness Follow Up Plan: Telephone follow up appointment with care management team member scheduled for: 03/09/21 at 11:30 AM The patient has been provided with contact information for the care management team and has been advised to call with any health related questions or concerns.      Care Plan : RNCM:Urinary Incontinence (Adult)  Updates made by [name], RN since 01/31/2021 12:00 AM     Problem: Symptom Management (Urinary Incontinence)   Priority: High     Long-Range Goal: Urinary Incontinence Symptoms Manged   Start Date: 10/03/2020  Expected End Date: 03/23/2021  This  Visit's Progress: On track  Recent Progress: On track  Priority: High  Note:   Current Barriers:  Ineffective Self Health Maintenance of urinary incontinence with hx of UTI, DM2, CAD, hx of CVA, atrial fib,  anemia Unable to independently self manage urinary incontinence Unable to perform IADLs independently-husband provides transportation to appointments States that she is still having nocturia 1-2 a night and she has urgency if she does not get to bathroom soon enough States she is to saw Dr. [name] on 12/20/20.  States she thinks the Myrbetic he gave her made her nauseated so she is not currently taking  Clinical Goal(s):  Collaboration with Pension scheme manager, Tommi Rumps, NP regarding development and update of comprehensive plan of care as evidenced by provider attestation and co-signature Inter-disciplinary care team collaboration (see longitudinal plan of care) patient will work with care management team to address care coordination and chronic disease management needs related to Disease Management Educational Needs Care Coordination   Interventions:  Evaluation of current treatment plan related to CAD, HTN, HLD, DMII, Anxiety, and hx CVA, atrial fib , Transportation,  ADL IADL limitations, and Inability to perform IADL's independently self-management and patient's adherence to plan as established by provider. Collaboration with [name], NP regarding development and update of comprehensive plan of care as evidenced by provider attestation       and co-signature Inter-disciplinary care team collaboration (see longitudinal plan of care) Discussed plans with patient for ongoing care management follow up and provided patient with direct contact information for care management team Reinforced on s/sx UTI and when to call MD Reinforced to try doing pelvic floor exercises when she feels urgency and to void every 2 hours during the day Reinforced to keep perineal area clean and dry Reinforced to keep  urology appointments with Dr. [name] and encouraged to discuss treatment options for incontinence and overactive bladder Referral to CCM PharmD for medication review, adherence and polypharmacy  Self Care Activities:  Patient verbalizes understanding of plan to self manage urinary incontinence Self administers medications as prescribed Attends all scheduled provider appointments Calls pharmacy for medication refills Calls provider office for new concerns or questions Patient Goals: - clean and dry skin well - keep skin dry - use a fragrance-free lotion on skin - wear a protective pad or garment Follow Up Plan: Telephone follow up appointment with care management team member scheduled for: 03/09/21 at 11:30 AM The patient has been provided with contact information for the care management team and has been advised to call with any health related questions or concerns.      Care Plan : RNCM:Cardiovascular Disease Management(HTN, CAD, Atrial fibrillation)  Updates made by [name], RN since 01/31/2021 12:00 AM     Problem: Lack of long term management of Cardiovascular Disease(HTN, CAD, Atrial fibrillation   Priority: Medium     Long-Range Goal: Effective Cardiovascular Disease Self  Management(HTN, CAD, Atrial fibrillation)   Start Date: 10/03/2020  Expected End Date: 03/23/2021  This Visit's Progress: On track  Recent Progress: On track  Priority: Medium  Note:   Current Barriers:  Knowledge deficits related to self health management of Cardiovascular Disease Management(HTN, CAD, Atrial fibrillation)  Knowledge Deficits related to Self management of Cardiovascular Disease Management(HTN, CAD, Atrial fibrillation Care Coordination needs related to disease management  in a patient with Self management of Cardiovascular Disease Management(HTN, CAD, Atrial fibrillation Chronic Disease Management support and education needs related to Self management of Cardiovascular Disease  Management(HTN, CAD, Atrial fibrillation Unable to independently Self management of Cardiovascular Disease Management(HTN, CAD, Atrial fibrillation Unable to perform IADLs independently States her B/P has been good with readings around 120-130/70-80, States she is having fatigue and is taking naps during the day.  States she has been having issues with her CPAP.  States she is to have a sleep study on 02/09/21.  States she thinks some of her medications are causing her drowsiness and wonders if she needs all of these medications No reports of chest pain or shortness of breath.  Nurse Case Manager Clinical Goal(s):  patient will take all medications exactly as prescribed and will call provider for medication related questions patient will verbalize understanding of Afib Action Plan and when to call doctor patient will verbalize understanding of plan for Self management of Cardiovascular Disease Management(HTN, CAD, Atrial fibrillation patient will meet with RN Care Manager to address Self management of Cardiovascular Disease Management(HTN, CAD, Atrial fibrillation patient will take all medications exactly as prescribed and will call provider for medication related questions patient will attend all scheduled medical appointments: no upcoming primary care provider scheduled,  sleep study 02/11/21, GI 03/02/21, Dr. [name] 02/28/21 neurology 05/18/21 patient will verbalize basic understanding of Self management of Cardiovascular Disease Management(HTN, CAD, Atrial fibrillation disease process and self health management plan as evidenced by verbalizing understanding of education, voiced adherence to treatment plan the patient will demonstrate ongoing self health care management ability as evidenced by voiced understanding of treatment plan and decrease in hospitalizations * Interventions:  Collaboration with [name], NP regarding development and update of comprehensive plan of care as evidenced by  provider attestation and co-signature Inter-disciplinary care team collaboration (see longitudinal plan of care) Basic overview and discussion of HTN, CAD, Atrial fibrillation Medications reviewed Afib action plan reviewed Evaluation of current treatment plan related to Self management of Cardiovascular Disease Management(HTN, CAD, Atrial fibrillation and patient's adherence to plan as established by provider. Reinforced to discuss issues with Dr. Brett Fairy concerning her CPAP Reviewed education  re: HTN, CAD, Atrial fibrillation Reviewed medications with patient and discussed adherence Reinforced to monitor blood pressure daily and record, calling provider for findings outside established parameters.  Discussed plans with patient for ongoing care management follow up and provided patient with direct contact information for care management team Referral to CCM PharmD to review medications and issues with polypharmacy  Self-Care Activities: Self administers medications as prescribed Attends all scheduled provider appointments Calls pharmacy for medication refills Calls provider office for new concerns or questions Patient Goals: - begin a symptom diary - make a plan to eat healthy - keep all lab appointments - take medicine as prescribed - check blood pressure 3 times per week - choose a place to take my blood pressure (home, clinic or office, retail store) - write blood pressure results in a log or diary Follow Up Plan: Telephone follow up appointment with care management team member scheduled for: 03/09/21 at 11:30 AM The patient has been provided with contact information for the care management team and has been advised to call with any health related questions or concerns.       Plan:Telephone follow up appointment with care management team member scheduled for:  03/09/21 and The patient has been provided with contact information for the care management team and has been advised to  call with any health related questions or concerns.  Peter Garter RN, Jackquline Denmark, CDE Care Management Coordinator [facility] [phone], Mobile [phone]

## 2021-02-02 ENCOUNTER — Telehealth: Payer: Self-pay | Admitting: Adult Health

## 2021-02-02 ENCOUNTER — Ambulatory Visit
Admission: RE | Admit: 2021-02-02 | Discharge: 2021-02-02 | Disposition: A | Discharge status: Home / Self Care | Payer: Medicare Other | Source: Ambulatory Visit | Attending: Physician Assistant | Admitting: Physician Assistant

## 2021-02-02 DIAGNOSIS — R160 Hepatomegaly, not elsewhere classified: Secondary | ICD-10-CM

## 2021-02-02 DIAGNOSIS — K769 Liver disease, unspecified: Secondary | ICD-10-CM

## 2021-02-02 DIAGNOSIS — N281 Cyst of kidney, acquired: Secondary | ICD-10-CM | POA: Diagnosis not present

## 2021-02-02 DIAGNOSIS — I2510 Atherosclerotic heart disease of native coronary artery without angina pectoris: Secondary | ICD-10-CM | POA: Diagnosis not present

## 2021-02-02 DIAGNOSIS — Z9049 Acquired absence of other specified parts of digestive tract: Secondary | ICD-10-CM | POA: Diagnosis not present

## 2021-02-02 MED ORDER — GADOBENATE DIMEGLUMINE 529 MG/ML IV SOLN
16.0000 mL | Freq: Once | INTRAVENOUS | Status: AC | PRN
  Administered 2021-02-02: 16 mL via INTRAVENOUS

## 2021-02-02 NOTE — Chronic Care Management (AMB) (Signed)
  Chronic Care Management   Outreach Note  02/02/2021 Name: Breanna Webster MRN: [id number] DOB: Jun 22, 1945  Referred by: [name], NP Reason for referral : No chief complaint on file.   An unsuccessful telephone outreach was attempted today. The patient was referred to the pharmacist for assistance with care management and care coordination.   Follow Up Plan:   Tatjana Dellinger Upstream Scheduler

## 2021-02-03 ENCOUNTER — Telehealth: Payer: Self-pay | Admitting: Adult Health

## 2021-02-03 NOTE — Progress Notes (Signed)
  Chronic Care Management   Note  02/03/2021 Name: Breanna Webster MRN: [id number] DOB: 09/22/1945  Breanna Webster is a 75 y.o. year old female who is a primary care patient of [name], NP. I reached out to [name] by phone Webster in response to a referral sent by Breanna Webster's PCP, [name], NP.   Breanna Webster including:  CCM service includes personalized support from designated clinical staff supervised by her physician, including individualized plan of care and coordination with other care providers 24/7 contact phone numbers for assistance for urgent and routine care needs. Service will only be billed when office clinical staff spend 20 minutes or more in a month to coordinate care. Only one practitioner may furnish and bill the service in a calendar month. The patient may stop CCM services at any time (effective at the end of the month) by phone call to the office staff.   Patient agreed to services and verbal consent obtained.   Follow up plan:PT AWARE $15 COPAY $50 DED   Tatjana Dellinger Upstream Scheduler

## 2021-02-05 ENCOUNTER — Other Ambulatory Visit: Payer: Medicare Other

## 2021-02-05 DIAGNOSIS — Z23 Encounter for immunization: Secondary | ICD-10-CM | POA: Diagnosis not present

## 2021-02-07 ENCOUNTER — Encounter: Payer: Self-pay | Admitting: Hematology

## 2021-02-09 ENCOUNTER — Ambulatory Visit (INDEPENDENT_AMBULATORY_CARE_PROVIDER_SITE_OTHER): Payer: Medicare Other | Admitting: Neurology

## 2021-02-09 ENCOUNTER — Other Ambulatory Visit: Payer: Self-pay

## 2021-02-09 DIAGNOSIS — I480 Paroxysmal atrial fibrillation: Secondary | ICD-10-CM

## 2021-02-09 DIAGNOSIS — G4733 Obstructive sleep apnea (adult) (pediatric): Secondary | ICD-10-CM | POA: Diagnosis not present

## 2021-02-09 DIAGNOSIS — G9331 Postviral fatigue syndrome: Secondary | ICD-10-CM

## 2021-02-09 DIAGNOSIS — U071 COVID-19: Secondary | ICD-10-CM

## 2021-02-09 DIAGNOSIS — I63431 Cerebral infarction due to embolism of right posterior cerebral artery: Secondary | ICD-10-CM

## 2021-02-09 DIAGNOSIS — Z789 Other specified health status: Secondary | ICD-10-CM

## 2021-02-14 ENCOUNTER — Telehealth: Payer: Self-pay | Admitting: Pharmacist

## 2021-02-14 DIAGNOSIS — U071 COVID-19: Secondary | ICD-10-CM | POA: Insufficient documentation

## 2021-02-14 DIAGNOSIS — Z789 Other specified health status: Secondary | ICD-10-CM | POA: Insufficient documentation

## 2021-02-14 DIAGNOSIS — G9331 Postviral fatigue syndrome: Secondary | ICD-10-CM | POA: Insufficient documentation

## 2021-02-14 NOTE — Chronic Care Management (AMB) (Signed)
Chronic Care Management Pharmacy Assistant   Name: Breanna Webster  MRN: [id number] DOB: 1945-06-05   Reason for Encounter:Chart Prep for initial visit with [name] Clinical Pharmacist on 02/17/21 at 10:30 via phone call.   Conditions to be addressed/monitored: HTN, DMII, Anxiety, and Dyslipidemia  Recent office visits:  01/31/21 [name], RN - Patient presented for [facility] Nurse visit and coordination of needs.  12/19/20 [name], RN - Patient presented for [facility] Nurse visit and coordination of needs.  12/08/20 [name], NP - Patient presented via [place] for [place] 19 virus. Prescribed Molnupiravir 800 mg.  11/23/20 Isaac Bliss, Rayford Halsted, MD - Patient presented for Acute cystitis without hematuria. Prescribed Ciprofloxacin HCL 250 mg.   11/07/20 [name], RN - Patient presented for [facility] Nurse visit and coordination of needs.  10/03/20 [name], RN - Patient presented for [facility] Nurse visit and coordination of needs.  09/16/20 [name], NP - Patient presented for Essential hypertension and other concerns. No medication changes.  Recent consult visits:  02/09/21 [name], MD (Neurology) - Patient presented to [facility] at [facility] for CVA and other concerns. No medication changes.  12/28/20 Dohmeier, Asencion Partridge, MD (Neurology) - Patient presented for CVA and other concerns. No medication changes.  12/27/20 [name], [name], PA-C [name]) - Patient presented for Gastroesophageal reflux disease and other concerns. Changed time for Famotidine 20 mg to bedtime and Omeprazole 20 mg to before meals.  12/05/20 [name], [name] [facility] - Patient presented for Type 2 diabetes mellitus with other diabetic neuro complication and other concerns. No medication changes.  11/24/20 [name], PA ([facility]) - Patient presented for COVID - 19 screening.  11/16/20  [name], [name] [facility] - Patient presented for Type 2 diabetes mellitus with other diabetic neuro complication and other concerns. No medication changes.  11/09/20 [name], [name] [facility] - Patient presented for Adult hypothyroidism and other concerns.No medication changes.  10/21/20 [name] (Family Med) - Patient presented for Unilateral primary osteoarthritis of right knee. X-Ray ordered, Bursa joint drained and Triamcinolone injection given.  10/17/20 [name] (Urology) - Patient presented for Acute cystitis without hematuria and other concerns. No other details available.  09/07/20 [name], MD (Oncology) - Patient presented for Anemia and other concerns. No medication changes.  09/05/20 [name] (Physical Med) - Patient presented for Unilateral primary osteoarthritis of left hip. Aspiration and Injection of [place] done.  5/9//22 [name], [name] [facility] - Patient presented for Adult hypothyroidism and other concerns.No medication changes.  08/29/20 [place], Heather (Optometry) - Patient presented for PR Fundus Photography.  5/5//22 [name], [name] (Endocinology) - Patient presented for Type 2 diabetes and other concerns.No medication changes.  08/25/20 [name] (Family Med) - Patient presented for Unilateral primary osteoarthritis of right knee and other concerns. X-Ray ordered, Bursa joint drained and Triamcinolone injection given.  Hospital visits:  Medication Reconciliation was completed by comparing discharge summary, patient's EMR and Pharmacy list, and upon discussion with patient.  Patient presented to [facility] on 09/08/20 due to Sepsis secondary to UTI. Patient was there for 4 days.  New?Medications Started at [facility] Discharge:?? -started Ciprofloxacin 500 mg twice a day for 6 days  Medication Changes at Hospital  Discharge: -Changed None  Medications Discontinued at Hospital Discharge: -Stopped Losartan 50 mg  Medications that remain the same after Hospital Discharge:??  -All other  medications will remain the same.    Medications: Outpatient Encounter Medications as of 02/14/2021  Medication Sig   BD INSULIN SYRINGE U/F 31G X 5/16" 1 ML MISC FOUR TIMES A DAY UNDER THE SKIN   BD PEN NEEDLE NANO 2ND GEN 32G X 4 MM MISC    cholecalciferol (VITAMIN D) 25 MCG (1000 UNIT) tablet Take 1,000 Units by mouth daily.   diltiazem (CARDIZEM CD) 120 MG 24 hr capsule TAKE ONE CAPSULE BY MOUTH DAILY   ELIQUIS 5 MG TABS tablet TAKE ONE TABLET BY MOUTH TWICE A DAY   escitalopram (LEXAPRO) 20 MG tablet Take 20 mg by mouth daily.   famotidine (PEPCID) 20 MG tablet TAKE ONE TABLET BY MOUTH EVERY NIGHT AT BEDTIME   Lancets (ONETOUCH DELICA PLUS [id number]) MISC USE TO CHECK BLOOD SUGAR TWICE DAILY.Marland Kitchen   levothyroxine (SYNTHROID) 137 MCG tablet Take 1 mcg by mouth once.   losartan (COZAAR) 50 MG tablet Take 50 mg by mouth daily.   metFORMIN (GLUCOPHAGE-XR) 500 MG 24 hr tablet Take 500 mg by mouth 2 (two) times daily.   Multiple Vitamin (MULITIVITAMIN WITH MINERALS) TABS Take 1 tablet by mouth daily.   nitroGLYCERIN (NITROSTAT) 0.4 MG SL tablet Place 1 tablet (0.4 mg total) under the tongue every 5 (five) minutes as needed for chest pain. (Patient taking differently: Place 0.4 mg under the tongue as needed for chest pain.)   NOVOLIN N 100 UNIT/ML injection Inject 28 Units into the skin daily before breakfast.   NOVOLIN R 100 UNIT/ML injection 8 Units. 10 UNITS THIRTY MINUTES BEFORE MEALS.  5 ADDITIONAL UNITS WITH CARBS OR SNACKS.   omeprazole (PRILOSEC) 20 MG capsule Take 1 capsule (20 mg total) by mouth 2 (two) times daily before a meal.   OneTouch Delica Lancets 71I MISC Tests BS 2 times   ONETOUCH VERIO test strip USE TO MONITOR GLUCOSE LEVELS TWICE DAILY   No facility-administered encounter medications on file as of  02/14/2021.  Fill History : ULTCARE INS SYR 1 ML 31GX5/16" 11/23/2020 50   ONE TOUCH DELICA PLUS 45Y LANCETS 05/06/2019 90   BD NANO 2 GEN PEN NDL 32GX4MM 11/26/2020 75   DILTIAZEM 24H ER(CD) 120 MG CP 12/10/2020 90   ELIQUIS 5 MG TABLET 01/18/2021 30   ESCITALOPRAM 20 MG TABLET 12/24/2020 90   FAMOTIDINE 20 MG TABLET 01/23/2021 30   SYNTHROID 137 MCG TABLET 01/10/2021 90   LOSARTAN POTASSIUM 50 MG TAB 01/19/2021 90   METFORMIN HCL ER 500 MG TABLET 11/21/2020 90   NITROGLYCERIN 0.4 MG TABLET SL 08/30/2020 25   NOVOLIN N 100 UNIT/ML VIAL 09/29/2019 90   OMEPRAZOLE DR 20 MG CAPSULE 01/23/2021 30   Have you seen any other providers since your last visit? Patient reports that she has seen her Urologist Dr Forbes Cellar.  Any changes in your medications or health? Patient reports Dr Forbes Cellar has started her on Estradial and Myrbetriq.  Any side effects from any medications? Patient reports no side effects she has been on her medications for some time.  Do you have an symptoms or problems not managed by your medications? Patient reports that she does not feel that any of her med's are working as well for her as she should. She reports she was taking Omeprazole and Famotidine for GERD and acid reflux but that has subsided so she no longer takes them.  Any concerns about your health right now? She reports she is always tired and feels bad almost every day. Walking for her  is painful with her arthritis and back issues. Her eyes have double vision and sometimes go dark for a min or so and she cant see. She reports her eye doctor has told her it was neurological so she say the neurologist and was told that there were no issues, but still having eye concerns. She feels she never fully recovered after having two strokes. Wanting to know if there are any supplements that would help with her energy levels that are natural. She reports she was told she could have a referral for PT but never heard  anything back about it wants to go to the gym near her home.  Has your provider asked that you check blood pressure, blood sugar, or follow special diet at home? Patient reports she checks her BP occassionally and her Blood sugars about 3 times a week fasting.  Do you get any type of exercise on a regular basis? Patient reports it is hard for her to get around, she moves very slowly and has back issues and arthritis.  Can you think of a goal you would like to reach for your health? To feel better, better mobility and increased energy.  Do you have any problems getting your medications? Patient report she is happy with her current pharmacy.  Is there anything that you would like to discuss during the appointment? No  Patient Assistance for the following medications :  Currently [employer] C[facility] for Insulins  Patient aware of appointment on 02/17/21 at 8:30 AM via phone call and to have logs and medications nearby for the call   Care Gaps: A1C 7.4 BP- 116/74 AWV 2021- MSG sent to [place] to schedule. Foot Exam - Overdue COVID Booster #4 Therapist, music) - Overdue Flu Vaccine - Overdue  Star Rating Drugs: Metformin (Glucophage) 500 mg - Last filled 11/21/20 90 DS at [facility] Losartan (Cozaar) 50 mg - Last filled 01/19/21 90 DS at [facility] Pharmacist Assistant [phone]

## 2021-02-14 NOTE — Procedures (Signed)
PATIENT'S NAME:  Breanna Webster, Breanna Webster DOB:      09-04-1945      MR#:    [id number]     DATE OF RECORDING: 02/09/2021 REFERRING M.D.:  Antony Contras, MD, [name], NP Study Performed:   Baseline Polysomnogram HISTORY:   Dr Leonie Man: Breanna Webster is a 75 y.o. year old female who first presented with increased shortness of breath and confusion x2 weeks along with headache on 10/21/2018 following admission on 10/03/2018 for sepsis and mitral valve endocarditis.  Stroke work-up revealed multifocal bilateral anterior and posterior infarcts cardioembolic pattern secondary to new onset A. fib versus recent endocarditis. Vascular risk factors include history of DVT, aortic stenosis status post TAVR 06/2018, pulmonary hypertension, HTN, DM, CAD status post stenting 03/2018, recent endocarditis, A. fib and new diagnosis of mild-moderate OSA with initiation of BiPAP.   BreannaSelby Webster returns 12-28-2020 for Sleep apnea follow-up - history of infective endocarditis 03-2018, after CAD stenting, pulmonary hypertension, a fib onset, embolic strokes, she reported more diplopia and balance problems since she unfortunately contracted COVID in mid August 2022 , less than a month ago.  Since then, she has been unable to tolerate auto- BiPAP- therapy but she states it was even before that she started to struggle more- and PAP compliance became spotty after 28 July 22, until then she has used the machine over 80% of the time but she often did not make it to 4 hours at night.  The average time of use when using machine was 4 hours and just 8 minutes.  She is using a maximum IPAP of 18 minimum EPAP of 9 and a pressure support of 4 cmH2O so this is an auto BiPAP ventilation with a residual AHI of 1.6/h.   COVID 19 infection 3 weeks ago, 12-05-2020, and now balance problems. She reports diplopia once in a while, decreased energy, not related to apnea, has noted decreased exercise tolerance.  She had undergone 2 sleep studies in the  past, one in 08/2018 was negative for sleep apnea and in 03/2020 AHI was 12.8/h. and apnea was unresponsive to CPAP- she had prolonged hypoxia, mild PLMs. She started auto- Bipap on 06/01/2020  The patient endorsed the Epworth Sleepiness Scale at 11/24 points.   The patient's weight 178 pounds with a height of 65 (inches), resulting in a BMI of 29.8 kg/m2. The patient's neck circumference measured 16.5 inches.  CURRENT MEDICATIONS: Insulin, Vitamin D, Cardizem, Eliquis, Lexapro, Pepcid, Synthroid, Cozaar, Glucophage, Multivitamins, Nitrostat, Novolin, Prilosec,   PROCEDURE:  This is a multichannel digital polysomnogram utilizing the Somnostar 11.2 system.  Electrodes and sensors were applied and monitored per AASM Specifications.   EEG, EOG, Chin and Limb EMG, were sampled at 200 Hz.  ECG, Snore and Nasal Pressure, Thermal Airflow, Respiratory Effort, CPAP Flow and Pressure, Oximetry was sampled at 50 Hz. Digital video and audio were recorded.      BASELINE STUDY: Lights Out was at 22:14 and Lights On at 04:48.  Total recording time (TRT) was 394.5 minutes, with a total sleep time (TST) of 276.5 minutes.   The patient's sleep latency was 115.5 minutes.  REM latency was 267.5 minutes.  The sleep efficiency was 70.1 %.     SLEEP ARCHITECTURE: WASO (Wake after sleep onset) was 62 minutes.  There were 11 minutes in Stage N1, 211.5 minutes Stage N2, 16 minutes Stage N3 and 38 minutes in Stage REM.  The percentage of Stage N1 was 4.%, Stage N2 was 76.5%, Stage N3  was 5.8% and Stage R (REM sleep) was 13.7%.   RESPIRATORY ANALYSIS:  There were a total of 2 respiratory events:  0 obstructive apneas, 0 central apneas and 0 mixed apneas with a total of 0 apneas and an apnea index (AI) of 0 /hour. There were 2 hypopneas with a hypopnea index of 0.4 /hour      The total APNEA/HYPOPNEA INDEX (AHI) was 0.4/hour.  0 events occurred in REM sleep and 4 events in NREM. The REM AHI was  0 /hour, versus a non-REM AHI of .5.  The patient spent 5 minutes of total sleep time in the supine position and 272 minutes in non-supine. The supine AHI was 0.0/h versus a non-supine AHI of 0.4/h.  OXYGEN SATURATION & C02:  The Wake baseline 02 saturation was 92%, with the lowest being 83%. Time spent below 89% saturation equaled 7 minutes.  The arousals were noted as: 35 were spontaneous, 9 were associated with PLMs, 0 were associated with respiratory events. PERIODIC LIMB MOVEMENTS:   The patient had a total of 289 Periodic Limb Movements.  The Periodic Limb Movement (PLM) Arousal index was 2.0/hour .PLMs were only present in NREM sleep and clustered between 1.30 AM and 3.30 AM- all sleep in that time was in right lateral position.  Audio and video analysis did not show any abnormal or unusual movements, behaviors, phonations or vocalizations.   EKG was in keeping with normal sinus rhythm (NSR).   IMPRESSION:  No evidence of Obstructive Sleep Apnea (OSA)!  NO APNEA !  Moderate severe Periodic Limb Movement Disorder (PLMD) Single channel EKG appears paced. No atrial fibrillation or PVCs noted.    RECOMMENDATIONS: Treatment of PLMs with low dose dopaminergic agonists is an option.  I will speak to her primary neurology provider, Dr Leonie Man, about Ropinirole at a low nightly po dose .    Patient can d/c BiPAP therapy as apnea was no longer present, and significant hypoxia neither. The underlying cardiac conditions have resolved- diastolic output is normalized.   I certify that I have reviewed the entire raw data recording prior to the issuance of this report in accordance with the Standards of Accreditation of the American Academy of Sleep Medicine (AASM)  Larey Seat, MD Diplomat, American Board of Neurology and of Sleep Medicine Medical Director, [place] [facility]

## 2021-02-14 NOTE — Progress Notes (Signed)
IMPRESSION:  1. No evidence of Obstructive Sleep Apnea (OSA)!  NO APNEA !  2. Moderate severe Periodic Limb Movement Disorder (PLMD) 3. Single channel EKG appears paced. No atrial fibrillation or PVCs noted.    RECOMMENDATIONS: Treatment of PLMs with low dose dopaminergic agonists is an option.  I will speak to her primary neurology provider, Dr Leonie Man, about Ropinirole at a low nightly po dose.    Patient can d/c BiPAP therapy as apnea was no longer present, and significant hypoxia neither. The underlying cardiac conditions have resolved- diastolic output is normalized.

## 2021-02-14 NOTE — Addendum Note (Signed)
Addended by: Larey Seat on: 02/14/2021 06:52 PM   Modules accepted: Orders

## 2021-02-16 ENCOUNTER — Ambulatory Visit
Admission: RE | Admit: 2021-02-16 | Discharge: 2021-02-16 | Disposition: A | Discharge status: Home / Self Care | Payer: Medicare Other | Source: Ambulatory Visit | Attending: Sports Medicine | Admitting: Sports Medicine

## 2021-02-16 ENCOUNTER — Telehealth: Payer: Self-pay | Admitting: Neurology

## 2021-02-16 ENCOUNTER — Other Ambulatory Visit: Payer: Self-pay

## 2021-02-16 DIAGNOSIS — M545 Low back pain, unspecified: Secondary | ICD-10-CM | POA: Diagnosis not present

## 2021-02-16 DIAGNOSIS — M48061 Spinal stenosis, lumbar region without neurogenic claudication: Secondary | ICD-10-CM | POA: Diagnosis not present

## 2021-02-16 DIAGNOSIS — M5416 Radiculopathy, lumbar region: Secondary | ICD-10-CM

## 2021-02-16 NOTE — Telephone Encounter (Signed)
-----   Message from Larey Seat, MD sent at 02/14/2021  6:52 PM EDT ----- IMPRESSION:  1. No evidence of Obstructive Sleep Apnea (OSA)!  NO APNEA !  2. Moderate severe Periodic Limb Movement Disorder (PLMD) 3. Single channel EKG appears paced. No atrial fibrillation or PVCs noted.    RECOMMENDATIONS: Treatment of PLMs with low dose dopaminergic agonists is an option.  I will speak to her primary neurology provider, Dr Leonie Man, about Ropinirole at a low nightly po dose.    Patient can d/c BiPAP therapy as apnea was no longer present, and significant hypoxia neither. The underlying cardiac conditions have resolved- diastolic output is normalized.

## 2021-02-16 NOTE — Telephone Encounter (Signed)
Called patient to discuss sleep study results. No answer at this time. LVM for the patient to call back.   

## 2021-02-17 ENCOUNTER — Telehealth: Payer: Self-pay | Admitting: Pharmacist

## 2021-02-17 ENCOUNTER — Telehealth: Payer: Medicare Other

## 2021-02-17 NOTE — Progress Notes (Deleted)
Chronic Care Management Pharmacy Note  02/17/2021 Name:  Breanna Webster MRN:  [id number] DOB:  Feb 27, 1946  Summary: ***  Recommendations/Changes made from today'Webster visit: ***  Plan: ***   Subjective: Breanna Webster is an 75 y.o. year old female who is a primary patient of Breanna Peng, NP.  The CCM team was consulted for assistance with disease management and care coordination needs.    Engaged with patient by telephone for initial visit in response to provider referral for pharmacy case management and/or care coordination services.   Consent to Services:  The patient was given the following information about Chronic Care Management services today, agreed to services, and gave verbal consent: 1. CCM service includes personalized support from designated clinical staff supervised by the primary care provider, including individualized plan of care and coordination with other care providers 2. 24/7 contact phone numbers for assistance for urgent and routine care needs. 3. Service will only be billed when office clinical staff spend 20 minutes or more in a month to coordinate care. 4. Only one practitioner may furnish and bill the service in a calendar month. 5.The patient may stop CCM services at any time (effective at the end of the month) by phone call to the office staff. 6. The patient will be responsible for cost sharing (co-pay) of up to 20% of the service fee (after annual deductible is met). Patient agreed to services and consent obtained.  Patient Care Team: Breanna Peng, NP as PCP - General (Family Medicine) [name], MD as PCP - Cardiology (Cardiology) Breanna Rear, MD as Consulting Physician (Endocrinology) Breanna Ped, RN as Case Manager Breanna Fila, MD as Referring Physician (Neurology) Breanna Webster, [facility] as Pharmacist (Pharmacist)  Recent office visits: 01/31/21 Breanna Ped, RN - Patient presented for [facility] Nurse visit and  coordination of needs.   12/19/20 Breanna Ped, RN - Patient presented for [facility] Nurse visit and coordination of needs.   12/08/20 Breanna Peng, NP - Patient presented via [place] for [place] 19 virus. Prescribed Molnupiravir 800 mg.   11/23/20 Isaac Bliss, Rayford Halsted, MD - Patient presented for Acute cystitis without hematuria. Prescribed Ciprofloxacin HCL 250 mg.    11/07/20 Breanna Ped, RN - Patient presented for [facility] Nurse visit and coordination of needs.   10/03/20 Breanna Ped, RN - Patient presented for [facility] Nurse visit and coordination of needs.   09/16/20 Breanna Peng, NP - Patient presented for Essential hypertension and other concerns. No medication changes.  Recent consult visits: 02/09/21 Breanna Asencion Partridge, MD (Neurology) - Patient presented to [facility] for CVA and other concerns. No medication changes.   12/28/20 Breanna, Asencion Partridge, MD (Neurology) - Patient presented for CVA and other concerns. No medication changes.   12/27/20 Breanna, [name], PA-C [name]) - Patient presented for Gastroesophageal reflux disease and other concerns. Changed time for Famotidine 20 mg to bedtime and Omeprazole 20 mg to before meals.   12/05/20 Breanna, Larna Webster [facility] - Patient presented for Type 2 diabetes mellitus with other diabetic neuro complication and other concerns. No medication changes.   11/24/20 Breanna R Little, PA ([facility]) - Patient presented for COVID - 19 screening.   11/16/20 Breanna, Larna Webster [facility] - Patient presented for Type 2 diabetes mellitus with other diabetic neuro complication and other concerns. No medication changes.   11/09/20 Breanna, Larna Webster [facility] - Patient presented for Adult hypothyroidism  and other concerns.No medication changes.   10/21/20 Breanna Webster (Family Med) - Patient presented for Unilateral  primary osteoarthritis of right knee. X-Ray ordered, Bursa joint drained and Triamcinolone injection given.   10/17/20 Breanna Webster (Urology) - Patient presented for Acute cystitis without hematuria and other concerns. No other details available.   09/07/20 Breanna Genera, MD (Oncology) - Patient presented for Anemia and other concerns. No medication changes.   09/05/20 Breanna Webster (Physical Med) - Patient presented for Unilateral primary osteoarthritis of left hip. Aspiration and Injection of [place] done.   5/9//22 Breanna, Larna Webster [facility] - Patient presented for Adult hypothyroidism and other concerns.No medication changes.   08/29/20 Breanna Webster (Optometry) - Patient presented for PR Fundus Photography.   5/5//22 Breanna, Larna Webster (Endocinology) - Patient presented for Type 2 diabetes and other concerns.No medication changes.   08/25/20 Breanna Webster (Family Med) - Patient presented for Unilateral primary osteoarthritis of right knee and other concerns. X-Ray ordered, Bursa joint drained and Triamcinolone injection given.  Hospital visits: Medication Reconciliation was completed by comparing discharge summary, patient'Webster EMR and Pharmacy list, and upon discussion with patient.   Patient presented to [facility] on 09/08/20 due to Sepsis secondary to UTI. Patient was there for 4 days.   New?Medications Started at [facility] Discharge:?? -started Ciprofloxacin 500 mg twice a day for 6 days   Medication Changes at Hospital Discharge: -Changed None   Medications Discontinued at Hospital Discharge: -Stopped Losartan 50 mg   Medications that remain the same after Hospital Discharge:??  -All other medications will remain the same.     Objective:  Lab Results  Component Value Date   CREATININE 0.7 12/05/2020   BUN 13 12/05/2020   GFR 84.65 09/16/2020   GFRNONAA 86 12/05/2020   GFRAA 99 12/05/2020   NA 140 12/05/2020    K 4.1 12/05/2020   CALCIUM 10.4 12/05/2020   CO2 28 (A) 12/05/2020   GLUCOSE 120 (H) 09/16/2020    Lab Results  Component Value Date/Time   HGBA1C 7.4 (H) 09/08/2020 04:16 AM   HGBA1C 6.5 05/05/2020 12:00 AM   HGBA1C 7.5 (H) 10/22/2018 04:45 AM   FRUCTOSAMINE 373 (H) 03/08/2016 11:43 AM   GFR 84.65 09/16/2020 02:43 PM   GFR 74.61 06/14/2017 08:26 AM   MICROALBUR 0.8 10/07/2015 01:30 PM   MICROALBUR 1.1 02/08/2014 02:49 PM    Last diabetic Eye exam:  Lab Results  Component Value Date/Time   HMDIABEYEEXA Retinopathy (A) 08/29/2020 12:00 AM    Last diabetic Foot exam: No results found for: HMDIABFOOTEX   Lab Results  Component Value Date   CHOL 188 12/05/2020   HDL 55 12/05/2020   LDLCALC 110 12/05/2020   LDLDIRECT 98.0 05/24/2014   TRIG 113 12/05/2020   CHOLHDL 5.3 10/22/2018    Hepatic Function Latest Ref Rng & Units 12/27/2020 12/05/2020 09/09/2020  Total Protein 6.0 - 8.3 g/dL 7.2 - 5.3(L)  Albumin 3.5 - 5.2 g/dL 4.1 4.1 2.7(L)  AST 0 - 37 U/L 28 34 28  ALT 0 - 35 U/L 36(H) 44(A) 48(H)  Alk Phosphatase 39 - 117 U/L 72 80 57  Total Bilirubin 0.2 - 1.2 mg/dL 0.9 - 0.8  Bilirubin, Direct 0.0 - 0.3 mg/dL 0.2 - -    Lab Results  Component Value Date/Time   TSH 1.44 12/05/2020 12:00 AM   TSH 1.47 05/05/2020 12:00 AM   TSH 5.935 (H) 04/30/2019 11:49 AM   TSH 9.460 (H) 11/12/2018 09:39 AM  TSH 0.322 (L) 10/21/2018 03:30 PM   TSH 0.79 08/05/2017 11:52 AM   FREET4 0.89 04/30/2019 11:49 AM   FREET4 1.78 (H) 10/21/2018 03:30 PM    CBC Latest Ref Rng & Units 09/16/2020 09/12/2020 09/11/2020  WBC 4.0 - 10.5 K/uL 8.5 4.6 3.9(L)  Hemoglobin 12.0 - 15.0 g/dL 9.6(L) 8.2(L) 9.3(L)  Hematocrit 36.0 - 46.0 % 29.9(L) 26.5(L) 29.1(L)  Platelets 150.0 - 400.0 K/uL 276.0 109(L) 95(L)    Lab Results  Component Value Date/Time   VD25OH 34.2 02/26/2017 11:16 AM   VD25OH 28.0 (L) 11/21/2016 11:13 AM   VD25OH 20.86 (L) 05/29/2016 11:16 AM    Clinical ASCVD: {YES/NO:21197} The  ASCVD Risk score (Arnett DK, et al., 2019) failed to calculate for the following reasons:   The patient has a prior MI or stroke diagnosis    Depression screen [facility] 2/9 10/03/2020 03/14/2020 03/02/2020  Decreased Interest 0 0 0  Down, Depressed, Hopeless 0 0 1  PHQ - 2 Score 0 0 1  Altered sleeping - 0 -  Tired, decreased energy - 0 -  Change in appetite - 0 -  Feeling bad or failure about yourself  - 0 -  Trouble concentrating - 0 -  Moving slowly or fidgety/restless - 0 -  Suicidal thoughts - 0 -  PHQ-9 Score - 0 -  Difficult doing work/chores - Not difficult at all -  Some recent data might be hidden     ***Other: (CHADS2VASc if Afib, MMRC or CAT for COPD, ACT, DEXA)  Social History   Tobacco Use  Smoking Status Never  Smokeless Tobacco Never   BP Readings from Last 3 Encounters:  12/28/20 116/74  12/27/20 126/60  11/23/20 130/70   Pulse Readings from Last 3 Encounters:  12/28/20 75  12/27/20 82  12/08/20 76   Wt Readings from Last 3 Encounters:  12/28/20 178 lb 8 oz (81 kg)  12/27/20 176 lb (79.8 kg)  12/08/20 179 lb (81.2 kg)   BMI Readings from Last 3 Encounters:  12/28/20 29.70 kg/m  12/27/20 29.29 kg/m  12/08/20 29.79 kg/m    Assessment/Interventions: Review of patient past medical history, allergies, medications, health status, including review of consultants reports, laboratory and other test data, was performed as part of comprehensive evaluation and provision of chronic care management services.   SDOH:  (Social Determinants of Health) assessments and interventions performed: {yes/no:20286}  SDOH Screenings   Alcohol Screen: Low Risk    Last Alcohol Screening Score (AUDIT): 0  Depression (PHQ2-9): Low Risk    PHQ-2 Score: 0  Financial Resource Strain: Low Risk    Difficulty of Paying Living Expenses: Not hard at all  Food Insecurity: No Food Insecurity   Worried About Charity fundraiser in the Last Year: Never true   Ran Out of Food in the  Last Year: Never true  Housing: Low Risk    Last Housing Risk Score: 0  Physical Activity: Insufficiently Active   Days of Exercise per Week: 2 days   Minutes of Exercise per Session: 50 min  Social Connections: Engineer, building services of Communication with Friends and Family: More than three times a week   Frequency of Social Gatherings with Friends and Family: Once a week   Attends Religious Services: More than 4 times per year   Active Member of [employer] or Organizations: Yes   Attends Archivist Meetings: More than 4 times per year   Marital Status: Married  Stress: Stress Concern  Present   Feeling of Stress : To some extent  Tobacco Use: Low Risk    Smoking Tobacco Use: Never   Smokeless Tobacco Use: Never   Passive Exposure: Not on file  Transportation Needs: No Transportation Needs   Lack of Transportation (Medical): No   Lack of Transportation (Non-Medical): No   Patient reports Dr Forbes Cellar has started her on Estradial and Myrbetriq.  Patient reports that she does not feel that any of her med'Webster are working as well for her as she should. She reports she was taking Omeprazole and Famotidine for GERD and acid reflux but that has subsided so she no longer takes them.  She reports she is always tired and feels bad almost every day. Walking for her is painful with her arthritis and back issues. Her eyes have double vision and sometimes go dark for a min or so and she cant see.   Patient reports she checks her BP occassionally and her Blood sugars about 3 times a week fasting.  CCM Care Plan  Allergies  Allergen Reactions   Statins Other (See Comments)    Muscle aches    Medications Reviewed Today     Reviewed by Breanna Ped, RN (Registered Nurse) on 01/31/21 at 1132  Med List Status: <None>   Medication Order Taking? Sig Documenting Provider Last Dose Status Informant  BD INSULIN SYRINGE U/F 31G X 5/16" 1 ML MISC [id number] No FOUR TIMES A DAY UNDER  THE SKIN Nafziger, Tommi Rumps, NP Taking Active Self  BD PEN NEEDLE NANO 2ND GEN 32G X 4 MM MISC [id number] No  [provider] Taking Active   cholecalciferol (VITAMIN D) 25 MCG (1000 UNIT) tablet [id number] No Take 1,000 Units by mouth daily. [provider] Taking Active Self  diltiazem (CARDIZEM CD) 120 MG 24 hr capsule [id number] No TAKE ONE CAPSULE BY MOUTH DAILY Nafziger, Tommi Rumps, NP Taking Active   ELIQUIS 5 MG TABS tablet [id number] No TAKE ONE TABLET BY MOUTH TWICE A DAY Nafziger, Cory, NP Taking Active   escitalopram (LEXAPRO) 20 MG tablet [id number] No Take 20 mg by mouth daily. [provider] Taking Active Self  famotidine (PEPCID) 20 MG tablet [id number]  TAKE ONE TABLET BY MOUTH EVERY NIGHT AT BEDTIME Breanna, [name], PA-C  Active   Lancets [facility] DELICA PLUS [id number]) [place] [id number] No USE TO CHECK BLOOD SUGAR TWICE DAILY.Marland Kitchen Renato Shin, MD Taking Active Self  levothyroxine (SYNTHROID) 137 MCG tablet [id number] No Take 1 mcg by mouth once. [provider] Taking Active   losartan (COZAAR) 50 MG tablet [id number] No Take 50 mg by mouth daily. [provider] Taking Active   metFORMIN (GLUCOPHAGE-XR) 500 MG 24 hr tablet [id number] No Take 500 mg by mouth 2 (two) times daily. [provider] Taking Active Self  Multiple Vitamin (MULITIVITAMIN WITH MINERALS) TABS [id number] No Take 1 tablet by mouth daily. [provider] Taking Active Self  nitroGLYCERIN (NITROSTAT) 0.4 MG SL tablet [id number] No Place 1 tablet (0.4 mg total) under the tongue every 5 (five) minutes as needed for chest pain.  Patient taking differently: Place 0.4 mg under the tongue as needed for chest pain.   [name], MD Taking Active   NOVOLIN N 100 UNIT/ML injection [id number] No Inject 28 Units into the skin daily before breakfast. [provider] Taking Active Self  NOVOLIN R 100 UNIT/ML injection [id number] No 8 Units. 10 UNITS THIRTY MINUTES BEFORE  MEALS.  5 ADDITIONAL UNITS WITH CARBS OR SNACKS.  [provider] Taking Active Self  omeprazole (PRILOSEC) 20 MG capsule [id number] No Take 1 capsule (20 mg total) by mouth 2 (two) times daily before a meal. Trellis Paganini, [name], PA-C Taking Active   OneTouch Delica Lancets 51O MISC [id number] No Tests BS 2 times [provider] Taking Active   [facility] VERIO test strip [id number] No USE TO MONITOR GLUCOSE LEVELS TWICE DAILY Renato Shin, MD Taking Active Self            Patient Active Problem List   Diagnosis Date Noted   COVID-19 02/14/2021   Postviral fatigue syndrome 02/14/2021   Difficulty with adaptive servo-ventilation (ASV) use 02/14/2021   Sepsis secondary to UTI ([place]) 09/08/2020   Elevated ALT measurement 09/08/2020   History of CVA with residual deficit 09/08/2020   Hyperbilirubinemia 09/08/2020   Fatigue associated with anemia 08/03/2020   Cerebrovascular accident (CVA) due to embolism of right posterior cerebral artery ([place]) 08/03/2020   OSA treated with BiPAP 08/03/2020   Complex sleep apnea syndrome 08/03/2020   Treatment-emergent central sleep apnea 08/03/2020   Chronic intermittent hypoxia with obstructive sleep apnea 04/21/2020   OSA (obstructive sleep apnea) 04/21/2020   History of cardioembolic stroke 55/73/2202   Gait disturbance, post-stroke 03/29/2020   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 ([place]) 03/29/2020   Anxiety    RLQ abdominal pain 10/22/2019   Paroxysmal atrial fibrillation ([place]) 05/22/2019   Iron deficiency anemia 05/07/2019   Atrial fibrillation with RVR ([place]) 10/21/2018   Cerebellar stroke, acute ([place]) 10/21/2018   Streptococcal endocarditis    Endocarditis of mitral valve 10/07/2018   Bacteremia due to Streptococcus Salivarius 10/07/2018   Sepsis ([place]) 54/27/0623   Acute metabolic encephalopathy 76/28/3151   Severe aortic stenosis 07/08/2018   [place]/P TAVR (transcatheter aortic valve replacement) 07/08/2018    Esophageal thickening    CAD in native artery 04/22/2018   Gastroesophageal reflux disease    Pulmonary hypertension ([place]) 02/21/2018   Essential hypertension 07/15/2017   History of colonic polyps 05/22/2017   Elevated liver function tests 12/05/2016   DOE (dyspnea on exertion) 07/19/2016   Thalassemia minor 05/29/2016   Left bundle branch block 12/06/2015   Upper airway cough syndrome 10/14/2015   Myalgia 02/17/2014   Carotid artery stenosis 06/05/2013   Eustachian tube dysfunction 05/07/2013   Neuropathy of leg 03/07/2012   Anemia, unspecified 01/31/2012   Hot flashes 08/24/2010   Diabetes mellitus due to underlying condition, uncontrolled 06/30/2010   Generalized abdominal pain 03/24/2010   Obesity (BMI 30.0-34.9) 12/21/2009   IBS (irritable bowel syndrome) 04/29/2009   Vitamin D deficiency 03/10/2009   Hypothyroidism 12/13/2008   Dyslipidemia 12/13/2008   Anxiety state 12/13/2008   Other specified disorders of bladder 12/13/2008    Immunization History  Administered Date(Webster) Administered   Fluad Quad(high Dose 65+) 01/20/2019, 01/06/2020   Hep A / Hep B 06/15/2015, 07/15/2015, 12/27/2015   Influenza Split 01/16/2012   Influenza Whole 01/31/2010   Influenza, High Dose Seasonal PF 01/28/2014, 12/29/2014, 02/17/2016, 01/21/2018   Influenza-Unspecified 12/29/2012   PFIZER(Purple Top)SARS-COV-2 Vaccination 05/16/2019, 06/06/2019, 02/06/2020   Pneumococcal Conjugate-13 06/14/2017   Pneumococcal Polysaccharide-23 01/28/2012, 01/28/2014   Pneumococcal-Unspecified 04/24/2015   Tdap 12/29/2014   Zoster Recombinat (Shingrix) 03/13/2017, 05/24/2017   Zoster, Live 02/14/2012    Conditions to be addressed/monitored:  Hypertension, Hyperlipidemia, Diabetes, Atrial Fibrillation, Coronary Artery Disease, GERD, Hypothyroidism, and Neuropathy  There are no care plans that you recently modified to display for this patient.   Current Barriers:  {pharmacybarriers:24917}  Pharmacist  Clinical Goal(Webster):  Patient will {PHARMACYGOALCHOICES:24921} through collaboration with PharmD and provider.   Interventions: 1:1 collaboration with Breanna Peng, NP regarding development and update of comprehensive plan of care as evidenced by provider attestation and co-signature Inter-disciplinary care team collaboration (see longitudinal plan of care) Comprehensive medication review performed; medication list updated in electronic medical record  BP Readings from Last 3 Encounters:  12/28/20 116/74  12/27/20 126/60  11/23/20 130/70    Hypertension (BP goal <130/80) -Controlled -Current treatment: Losartan 50 mg 1 tablet daily Diltiazem 120 mg 1 capsule daily -Medications previously tried: ***  -Current home readings: *** -Current dietary habits: *** -Current exercise habits: *** -{ACTIONS;DENIES/REPORTS:21021675::"Denies"} hypotensive/hypertensive symptoms -Educated on {CCM BP Counseling:25124} -Counseled to monitor BP at home ***, document, and provide log at future appointments -{CCMPHARMDINTERVENTION:25122}  Lab Results  Component Value Date   CHOL 188 12/05/2020   HDL 55 12/05/2020   LDLCALC 110 12/05/2020   LDLDIRECT 98.0 05/24/2014   TRIG 113 12/05/2020   CHOLHDL 5.3 10/22/2018     Hyperlipidemia: (LDL goal < 70) -Uncontrolled -Current treatment: *** -Medications previously tried: statins (muscle aches?), Zetia (?) -Current dietary patterns: *** -Current exercise habits: *** -Educated on {CCM HLD Counseling:25126} -{CCMPHARMDINTERVENTION:25122} Pcsk9?  CAD/History of stroke (Goal: ***) -{US controlled/uncontrolled:25276} -Current treatment  Eliquis 5 mg 1 tablet twice daily Nitroglycerin 0.4 mg 1 tablet as needed -Medications previously tried: ***  -{CCMPHARMDINTERVENTION:25122}  Lab Results  Component Value Date   HGBA1C 7.4 (H) 09/08/2020     Diabetes (A1c goal <7%) -Uncontrolled -Current medications: Novolin N Novolin R Metformin XR  500 mg 1 tablet twice daily -Medications previously tried: ***  -Current home glucose readings fasting glucose: *** post prandial glucose: *** -{ACTIONS;DENIES/REPORTS:21021675::"Denies"} hypoglycemic/hyperglycemic symptoms -Current meal patterns:  breakfast: ***  lunch: ***  dinner: *** snacks: *** drinks: *** -Current exercise: *** -Educated on {CCM DM COUNSELING:25123} -Counseled to check feet daily and get yearly eye exams -{CCMPHARMDINTERVENTION:25122}  Atrial Fibrillation (Goal: prevent stroke and major bleeding) -{US controlled/uncontrolled:25276} -CHADSVASC: *** -Current treatment: Rate control: *** Anticoagulation: *** -Medications previously tried: *** -Home BP and HR readings: ***  -Counseled on {CCMAFIBCOUNSELING:25120} -{CCMPHARMDINTERVENTION:25122}  Depression/Anxiety (Goal: ***) -{US controlled/uncontrolled:25276} -Current treatment: Escitalopram 20 mg 1 tablet daily -Medications previously tried/failed: *** -PHQ9: *** -GAD7: *** -Connected with *** for mental health support -Educated on {CCM mental health counseling:25127} -{CCMPHARMDINTERVENTION:25122}  Lab Results  Component Value Date   TSH 1.44 12/05/2020    Hypothyroidism (Goal: ***) -{US controlled/uncontrolled:25276} -Current treatment  Levothyroxine 137 mcg 1 tablet every morning before breakfast -Medications previously tried: ***  -{CCMPHARMDINTERVENTION:25122} -decrease dose?  Overactive bladder (Goal: ***) -{US controlled/uncontrolled:25276} -Current treatment  Myrbetriq? -Medications previously tried: ***  -{CCMPHARMDINTERVENTION:25122}  GERD (Goal: ***) -{US controlled/uncontrolled:25276} -Current treatment  Omeprazole 20 mg 1 capsule twice daily Famotidine 20 mg 1 tablet at bedtime -Medications previously tried: ***  -{CCMPHARMDINTERVENTION:25122} -needs to stay on omeprazole - she stopped this and famotidine  Osteopenia (Goal ***) -{US  controlled/uncontrolled:25276} -Last DEXA Scan: 04/08/2017   T-Score femoral neck: -1.8  T-Score total hip: n/a  T-Score lumbar spine: -2.0  T-Score forearm radius: n/a  10-year probability of major osteoporotic fracture: 11.3%  10-year probability of hip fracture: 2.3% -Patient {is;is not an osteoporosis candidate:23886} -Current treatment  Vitamin D 1000 units daily -Medications previously tried: ***  -{Osteoporosis Counseling:23892} -{CCMPHARMDINTERVENTION:25122}    Health Maintenance -Vaccine gaps: *** -Current therapy:  Multivitamin 1 tablet daily -Educated on {ccm supplement counseling:25128} -{CCM Patient satisfied:25129} -{CCMPHARMDINTERVENTION:25122}  Patient Goals/Self-Care Activities Patient will:  - {pharmacypatientgoals:24919}  Follow Up Plan: {CM FOLLOW UP GURK:27062}   Medication Assistance: {MEDASSISTANCEINFO:25044}  Compliance/Adherence/Medication fill history: Care Gaps: Foot Exam - Overdue COVID Booster #4 Therapist, music) - Overdue Flu Vaccine - Overdue A1C 7.4 BP- 116/74  Star-Rating Drugs: Metformin (Glucophage) 500 mg - Last filled 11/21/20 90 DS at [facility] Losartan (Cozaar) 50 mg - Last filled 01/19/21 90 DS at [facility]  Patient'[place] preferred pharmacy is:  [facility] [id number] [address], [place] [address] Phone: [phone] Fax: [phone]  Uses pill box? {Yes or If no, why not?:20788} Pt endorses ***% compliance  We discussed: {Pharmacy options:24294} Patient decided to: {US [facility]  Care Plan and Follow Up Patient Decision:  {FOLLOWUP:24991}  Plan: {CM FOLLOW UP KKXF:81829}  [name], PharmD, [facility] Clinical Pharmacist {MP practice sites:26434} [phone]

## 2021-02-17 NOTE — Telephone Encounter (Signed)
  Chronic Care Management   Outreach Note  02/17/2021 Name: JANENE YOUSUF MRN: [id number] DOB: Jun 01, 1945  Referred by: [name], NP  Patient had a phone appointment scheduled with clinical pharmacist today.  An unsuccessful telephone outreach was attempted today. The patient was referred to the pharmacist for assistance with care management and care coordination.   If possible, a message was left to return call to: [phone] or to [facility]: [place], PharmD, [place] [facility] [place]

## 2021-02-18 ENCOUNTER — Other Ambulatory Visit: Payer: Self-pay | Admitting: Physician Assistant

## 2021-02-20 ENCOUNTER — Ambulatory Visit: Payer: Medicare Other | Admitting: Pharmacist

## 2021-02-20 DIAGNOSIS — I480 Paroxysmal atrial fibrillation: Secondary | ICD-10-CM

## 2021-02-20 DIAGNOSIS — E785 Hyperlipidemia, unspecified: Secondary | ICD-10-CM

## 2021-02-20 DIAGNOSIS — I10 Essential (primary) hypertension: Secondary | ICD-10-CM

## 2021-02-20 DIAGNOSIS — Z794 Long term (current) use of insulin: Secondary | ICD-10-CM

## 2021-02-20 DIAGNOSIS — E1169 Type 2 diabetes mellitus with other specified complication: Secondary | ICD-10-CM

## 2021-02-20 NOTE — Patient Instructions (Addendum)
Hi Breanna Webster,  It was great to get to meet you over the telephone! Below is a summary of some of the topics we discussed.   Voltaren gel   Please reach out to me if you have any questions or need anything before our follow up!  Best, Maddie  Breanna Webster, PharmD, [facility]    Visit Information   Goals Addressed   None    Patient Care Plan: RNCM:Diabetes Type 2 (Adult)     Problem Identified: Lack of Long Term plan for self managment of Type 2 DM   Priority: Medium     Long-Range Goal: Effective long term self management of Type 2 DM   Start Date: 10/03/2020  Expected End Date: 03/23/2021  This Visit's Progress: On track  Recent Progress: On track  Priority: Medium  Note:   Objective:  Lab Results  Component Value Date   HGBA1C 7.4 (H) 09/08/2020   Lab Results  Component Value Date   CREATININE 0.67 09/16/2020   CREATININE 0.74 09/11/2020   CREATININE 0.77 09/10/2020   No results found for: EGFR Current Barriers:  Knowledge Deficits related to basic Diabetes pathophysiology and self care/management Knowledge Deficits related to medications used for management of diabetes Unable to independently self manage Type 2 DM Unable to perform IADLs independently-Husband assists with transportation  Pt states that she her blood sugars have been up some this last week with readings up to 200.  States she has not changed her diet but she did think she and her husband had a mild cold/virus this last week  States she is to see Dr. Garnet Webster 02/28/21. States her last A1C was 6.9%. Denies any recent hypoglycemia Reports following a low CHO diet.  States her husband has been helping her as needed.  States she did have a fall that injured her shoulder Case Manager Clinical Goal(s):  patient will demonstrate improved adherence to prescribed treatment plan for diabetes self care/management as evidenced by: daily monitoring and  recording of CBG  adherence to ADA/ carb modified diet adherence to prescribed medication regimen contacting provider for new or worsened symptoms or questions Interventions:  Collaboration with Carlisle Cater, Tommi Rumps, NP regarding development and update of comprehensive plan of care as evidenced by provider attestation and co-signature Inter-disciplinary care team collaboration (see longitudinal plan of care) Provided education to patient about basic DM disease process Reviewed medications with patient and discussed importance of medication adherence Discussed plans with patient for ongoing care management follow up and provided patient with direct contact information for care management team Reinforced s/sx hypoglycemia and hyperglycemia and importance of correct treatment Reviewed scheduled/upcoming provider appointments including: no upcoming primary care provider scheduled, sleep study 02/11/21, GI 03/02/21, Dr. Garnet Webster 02/28/21 neurology 05/18/21 Reinforced to check cbg before meals and record, calling endocrinologist or primary care provider   for findings outside established parameters.   Review of patient status, including review of consultants reports, relevant laboratory and other test results, and medications completed. Reviewed importance of increased activity and fall prevention Self-Care Activities - Self administers oral medications as prescribed Self administers insulin as prescribed Attends all scheduled provider appointments Checks blood sugars as prescribed and utilize hyper and hypoglycemia protocol as needed Adheres to prescribed ADA/carb modified Patient Goals: - check blood sugar at prescribed times - check blood sugar if I feel it is too high or too low - enter blood sugar readings and medication or insulin into daily log - take the blood sugar  log to all doctor visits - change to whole grain breads, cereal, pasta - drink 6 to 8 glasses of water each day - fill half of plate  with vegetables - manage portion size - read food labels for fat, fiber, carbohydrates and portion size - switch to sugar-free drinks - keep appointment with eye doctor - check feet daily for cuts, sores or redness Follow Up Plan: Telephone follow up appointment with care management team member scheduled for: 03/09/21 at 11:30 AM The patient has been provided with contact information for the care management team and has been advised to call with any health related questions or concerns.      Patient Care Plan: RNCM:Urinary Incontinence (Adult)     Problem Identified: Symptom Management (Urinary Incontinence)   Priority: High     Long-Range Goal: Urinary Incontinence Symptoms Manged   Start Date: 10/03/2020  Expected End Date: 03/23/2021  This Visit's Progress: On track  Recent Progress: On track  Priority: High  Note:   Current Barriers:  Ineffective Self Health Maintenance of urinary incontinence with hx of UTI, DM2, CAD, hx of CVA, atrial fib,  anemia Unable to independently self manage urinary incontinence Unable to perform IADLs independently-husband provides transportation to appointments States that she is still having nocturia 1-2 a night and she has urgency if she does not get to bathroom soon enough States she is to saw Dr. Diona Webster on 12/20/20.  States she thinks the Myrbetic he gave her made her nauseated so she is not currently taking  Clinical Goal(s):  Collaboration with Pension scheme manager, Tommi Rumps, NP regarding development and update of comprehensive plan of care as evidenced by provider attestation and co-signature Inter-disciplinary care team collaboration (see longitudinal plan of care) patient will work with care management team to address care coordination and chronic disease management needs related to Disease Management Educational Needs Care Coordination   Interventions:  Evaluation of current treatment plan related to CAD, HTN, HLD, DMII, Anxiety, and hx CVA, atrial fib ,  Transportation, ADL IADL limitations, and Inability to perform IADL's independently self-management and patient's adherence to plan as established by provider. Collaboration with [name], NP regarding development and update of comprehensive plan of care as evidenced by provider attestation       and co-signature Inter-disciplinary care team collaboration (see longitudinal plan of care) Discussed plans with patient for ongoing care management follow up and provided patient with direct contact information for care management team Reinforced on s/sx UTI and when to call MD Reinforced to try doing pelvic floor exercises when she feels urgency and to void every 2 hours during the day Reinforced to keep perineal area clean and dry Reinforced to keep urology appointments with Dr. Diona Webster and encouraged to discuss treatment options for incontinence and overactive bladder Referral to CCM PharmD for medication review, adherence and polypharmacy  Self Care Activities:  Patient verbalizes understanding of plan to self manage urinary incontinence Self administers medications as prescribed Attends all scheduled provider appointments Calls pharmacy for medication refills Calls provider office for new concerns or questions Patient Goals: - clean and dry skin well - keep skin dry - use a fragrance-free lotion on skin - wear a protective pad or garment Follow Up Plan: Telephone follow up appointment with care management team member scheduled for: 03/09/21 at 11:30 AM The patient has been provided with contact information for the care management team and has been advised to call with any health related questions or concerns.      Patient  Care Plan: RNCM:Cardiovascular Disease Management(HTN, CAD, Atrial fibrillation)     Problem Identified: Lack of long term management of Cardiovascular Disease(HTN, CAD, Atrial fibrillation   Priority: Medium     Long-Range Goal: Effective Cardiovascular  Disease Self  Management(HTN, CAD, Atrial fibrillation)   Start Date: 10/03/2020  Expected End Date: 03/23/2021  This Visit's Progress: On track  Recent Progress: On track  Priority: Medium  Note:   Current Barriers:  Knowledge deficits related to self health management of Cardiovascular Disease Management(HTN, CAD, Atrial fibrillation)  Knowledge Deficits related to Self management of Cardiovascular Disease Management(HTN, CAD, Atrial fibrillation Care Coordination needs related to disease management  in a patient with Self management of Cardiovascular Disease Management(HTN, CAD, Atrial fibrillation Chronic Disease Management support and education needs related to Self management of Cardiovascular Disease Management(HTN, CAD, Atrial fibrillation Unable to independently Self management of Cardiovascular Disease Management(HTN, CAD, Atrial fibrillation Unable to perform IADLs independently States her B/P has been good with readings around 120-130/70-80, States she is having fatigue and is taking naps during the day.  States she has been having issues with her CPAP.  States she is to have a sleep study on 02/09/21.  States she thinks some of her medications are causing her drowsiness and wonders if she needs all of these medications No reports of chest pain or shortness of breath.  Nurse Case Manager Clinical Goal(s):  patient will take all medications exactly as prescribed and will call provider for medication related questions patient will verbalize understanding of Afib Action Plan and when to call doctor patient will verbalize understanding of plan for Self management of Cardiovascular Disease Management(HTN, CAD, Atrial fibrillation patient will meet with RN Care Manager to address Self management of Cardiovascular Disease Management(HTN, CAD, Atrial fibrillation patient will take all medications exactly as prescribed and will call provider for medication related questions patient will attend  all scheduled medical appointments: no upcoming primary care provider scheduled, sleep study 02/11/21, GI 03/02/21, Dr. Garnet Webster 02/28/21 neurology 05/18/21 patient will verbalize basic understanding of Self management of Cardiovascular Disease Management(HTN, CAD, Atrial fibrillation disease process and self health management plan as evidenced by verbalizing understanding of education, voiced adherence to treatment plan the patient will demonstrate ongoing self health care management ability as evidenced by voiced understanding of treatment plan and decrease in hospitalizations * Interventions:  Collaboration with [name], NP regarding development and update of comprehensive plan of care as evidenced by provider attestation and co-signature Inter-disciplinary care team collaboration (see longitudinal plan of care) Basic overview and discussion of HTN, CAD, Atrial fibrillation Medications reviewed Afib action plan reviewed Evaluation of current treatment plan related to Self management of Cardiovascular Disease Management(HTN, CAD, Atrial fibrillation and patient's adherence to plan as established by provider. Reinforced to discuss issues with Dr. Brett Fairy concerning her CPAP Reviewed education  re: HTN, CAD, Atrial fibrillation Reviewed medications with patient and discussed adherence Reinforced to monitor blood pressure daily and record, calling provider for findings outside established parameters.  Discussed plans with patient for ongoing care management follow up and provided patient with direct contact information for care management team Referral to CCM PharmD to review medications and issues with polypharmacy  Self-Care Activities: Self administers medications as prescribed Attends all scheduled provider appointments Calls pharmacy for medication refills Calls provider office for new concerns or questions Patient Goals: - begin a symptom diary - make a plan to eat healthy - keep  all lab appointments - take medicine as prescribed - check blood pressure  3 times per week - choose a place to take my blood pressure (home, clinic or office, retail store) - write blood pressure results in a log or diary Follow Up Plan: Telephone follow up appointment with care management team member scheduled for: 03/09/21 at 11:30 AM The patient has been provided with contact information for the care management team and has been advised to call with any health related questions or concerns.      Patient Care Plan: CCM Pharmacy Care Plan     Problem Identified: Problem: Hypertension, Hyperlipidemia, Diabetes, Atrial Fibrillation, Coronary Artery Disease, GERD, Hypothyroidism, Overactive Bladder, and Neuropathy      Long-Range Goal: Patient-Specific Goal   Start Date: 02/20/2021  Expected End Date: 02/20/2022  This Visit's Progress: On track  Priority: High  Note:   Current Barriers:  Unable to independently monitor therapeutic efficacy Unable to maintain control of overactive bladder Suboptimal therapeutic regimen for diabetes  Pharmacist Clinical Goal(s):  Patient will achieve adherence to monitoring guidelines and medication adherence to achieve therapeutic efficacy maintain control of diabetes as evidenced by A1c  through collaboration with PharmD and provider.   Interventions: 1:1 collaboration with [name], NP regarding development and update of comprehensive plan of care as evidenced by provider attestation and co-signature Inter-disciplinary care team collaboration (see longitudinal plan of care) Comprehensive medication review performed; medication list updated in electronic medical record  Hypertension (BP goal <130/80) -Controlled -Current treatment: Losartan 50 mg 1 tablet daily Diltiazem 120 mg 1 capsule daily -Medications previously tried: n/a -Current home readings: 135/70 (normal - that his high for her) - 110-120/70 (wrist cuff) -Current dietary  habits: not a salt person; cooking at home; pre-packed vegetables (no canned vegetables) -Current exercise habits: not been able to exercise because she cannot walk with knees and back -Denies hypotensive/hypertensive symptoms -Educated on Importance of home blood pressure monitoring; Proper BP monitoring technique; Symptoms of hypotension and importance of maintaining adequate hydration; -Counseled to monitor BP at home weekly, document, and provide log at future appointments -Counseled on diet and exercise extensively Recommended to continue current medication Recommended purchasing an arm blood pressure cuff to ensure accuracy.  Hyperlipidemia: (LDL goal < 70) -Uncontrolled -Current treatment: No medications -Medications previously tried: statins (muscle aches), Zetia -Current dietary patterns: did not discuss -Current exercise habits: unable to -Educated on Cholesterol goals;  -Counseled on diet and exercise extensively Readdress at follow up.  CAD/History of stroke (Goal: prevent future events) -Uncontrolled -Current treatment  Eliquis 5 mg 1 tablet twice daily Nitroglycerin 0.4 mg 1 tablet as needed -Medications previously tried: aspirin  -Recommended to continue current medication Recommended checking expiration date for nitroglycerin.  Diabetes (A1c goal <7%) -Controlled (per patient report of recent A1c of 5.9%) -Current medications: Novolin N 28 units before breakfast Novolin R 8 units in morning and at night Metformin XR 500 mg 1 tablet twice daily -Medications previously tried: other insulins (switched due to cost) -Current home glucose readings fasting glucose: 109, 167, 169, 137, 162, 160 (twice a day) post prandial glucose: 152, 154, 129, 187 (right before dinner) -Reports hypoglycemic/hyperglycemic symptoms (lethargy) -Current meal patterns:  breakfast: bran cereal or 2 eggs and toast or yogurt or croissant with cheese  lunch: not taking insulin in  afternoon  dinner: meat and vegetables (varies) snacks: n/a drinks: water; stopped drinking ice brand -Current exercise: unable to -Educated on A1c and blood sugar goals; Prevention and management of hypoglycemic episodes; Benefits of routine self-monitoring of blood sugar; Carbohydrate counting and/or plate method -  Counseled to check feet daily and get yearly eye exams -Counseled on diet and exercise extensively Recommended to continue current medication Recommended discussion with endo about starting a Dexcom or Freestyle Libre and switching insulins to newer and longer lasting ones (Basaglar and Humalog)  Atrial Fibrillation (Goal: prevent stroke and major bleeding) -Controlled -CHADSVASC: 9 -Current treatment: Rate control: Diltiazem 120 mg 1 capsule daily Anticoagulation: Eliquis 5 mg 1 tablet twice daily -Medications previously tried: none -Home BP and HR readings: normal at home is 68 -Counseled on increased risk of stroke due to Afib and benefits of anticoagulation for stroke prevention; bleeding risk associated with Eliquis and importance of self-monitoring for signs/symptoms of bleeding; avoidance of NSAIDs due to increased bleeding risk with anticoagulants; -Recommended to continue current medication  Depression/Anxiety (Goal: minimize symptoms) -Controlled -Current treatment: Escitalopram 20 mg 1 tablet daily -Medications previously tried/failed: unknown -PHQ9: 0 -GAD7: n/a -Educated on Benefits of medication for symptom control Benefits of cognitive-behavioral therapy with or without medication -Recommended to continue current medication   Hypothyroidism (Goal: TSH 2.5-4.5) -Not ideally controlled -Current treatment  Levothyroxine 137 mcg 1 tablet every morning before breakfast -Medications previously tried: none  -Recommended repeat TSH due to fatigue.  Overactive bladder (Goal: minimize symptoms) -Controlled -Current treatment  Myrbetriq 50 mg 1 tablet  daily -Medications previously tried: none  -Recommended moving to bedtime to see if this helps frequent bathroom trips overnight.  GERD/Barrett's esophagus (Goal: minimize symptoms and protect esophagus) -Not ideally controlled -Current treatment  Omeprazole 20 mg 1 capsule twice daily - stopped taking Famotidine 20 mg 1 tablet at bedtime - stopped taking -Medications previously tried: none  -Counseled on benefits of taking omeprazole with Barrett's esophagus. Recommended to restart omeprazole at least once daily.  Osteopenia (Goal prevent fractures) -Controlled -Last DEXA Scan: 04/08/2017   T-Score femoral neck: -1.8  T-Score total hip: n/a  T-Score lumbar spine: -2.0  T-Score forearm radius: n/a  10-year probability of major osteoporotic fracture: 11.3%  10-year probability of hip fracture: 2.3% -Patient is not a candidate for pharmacologic treatment -Current treatment  Vitamin D 1000 units daily -Medications previously tried: none  -Recommend [phone] units of vitamin D daily. Recommend 1200 mg of calcium daily from dietary and supplemental sources. Recommend weight-bearing and muscle strengthening exercises for building and maintaining bone density. -Recommended repeat DEXA.  Osteoarthritis (Goal: minimize pain) -Uncontrolled -Current treatment  Acetaminophen 500 mg 2 capsules as needed -Medications previously tried: n/a  -Recommended trial of Voltaren gel for knee pain.  Health Maintenance -Vaccine gaps: influenza, COVID booster -Current therapy:  Multivitamin 1 tablet daily Vitamin B complex daily -Educated on Cost vs benefit of each product must be carefully weighed by individual consumer -Patient is satisfied with current therapy and denies issues -Recommended to continue current medication  Patient Goals/Self-Care Activities Patient will:  - check glucose at least daily, document, and provide at future appointments check blood pressure weekly, document, and  provide at future appointments target a minimum of 150 minutes of moderate intensity exercise weekly  Follow Up Plan: The care management team will reach out to the patient again over the next 30 days.       Ms. Iannello was given information about Chronic Care Management services today including:  CCM service includes personalized support from designated clinical staff supervised by her physician, including individualized plan of care and coordination with other care providers 24/7 contact phone numbers for assistance for urgent and routine care needs. Standard insurance, coinsurance, copays and deductibles apply for  chronic care management only during months in which we provide at least 20 minutes of these services. Most insurances cover these services at 100%, however patients may be responsible for any copay, coinsurance and/or deductible if applicable. This service may help you avoid the need for more expensive face-to-face services. Only one practitioner may furnish and bill the service in a calendar month. The patient may stop CCM services at any time (effective at the end of the month) by phone call to the office staff.  Patient agreed to services and verbal consent obtained.   The patient verbalized understanding of instructions, educational materials, and care plan provided today and agreed to receive a mailed copy of patient instructions, educational materials, and care plan.  The pharmacy team will reach out to the patient again over the next 30 days.   Viona Gilmore, [facility]

## 2021-02-20 NOTE — Progress Notes (Signed)
Chronic Care Management Pharmacy Note  02/20/2021 Name:  Breanna Webster MRN:  [id number] DOB:  12-16-1945  Summary: BP is at goal < 130/80 A1c is at goal < 7%  Recommendations/Changes made from today'Webster visit: -Recommended taking Myrbetriq at bedtime to see if this helps with nighttime awakenings -Recommended restarting omeprazole at least daily -Inquired about the need for amitriptyline -Recommended weekly monitoring of blood pressure at home  Plan: Follow up in 1 month to reassess fatigue   Subjective: Breanna Webster is an 75 y.o. year old female who is a primary patient of Breanna Peng, NP.  The CCM team was consulted for assistance with disease management and care coordination needs.    Engaged with patient by telephone for initial visit in response to provider referral for pharmacy case management and/or care coordination services.   Consent to Services:  The patient was given the following information about Chronic Care Management services today, agreed to services, and gave verbal consent: 1. CCM service includes personalized support from designated clinical staff supervised by the primary care provider, including individualized plan of care and coordination with other care providers 2. 24/7 contact phone numbers for assistance for urgent and routine care needs. 3. Service will only be billed when office clinical staff spend 20 minutes or more in a month to coordinate care. 4. Only one practitioner may furnish and bill the service in a calendar month. 5.The patient may stop CCM services at any time (effective at the end of the month) by phone call to the office staff. 6. The patient will be responsible for cost sharing (co-pay) of up to 20% of the service fee (after annual deductible is met). Patient agreed to services and consent obtained.  Patient Care Team: Breanna Peng, NP as PCP - General (Family Medicine) Breanna Crome, MD as PCP - Cardiology  (Cardiology) Breanna Rear, MD as Consulting Physician (Endocrinology) Breanna Ped, RN as Case Manager Breanna Fila, MD as Referring Physician (Neurology) Breanna [facility], [facility] as Pharmacist (Pharmacist)  Recent office visits: 01/31/21 Breanna Ped, RN - Patient presented for [facility] Nurse visit and coordination of needs.   12/19/20 Breanna Ped, RN - Patient presented for [facility] Nurse visit and coordination of needs.   12/08/20 Breanna Peng, NP - Patient presented via [employer] for [place] 19 virus. Prescribed Molnupiravir 800 mg.   11/23/20 Isaac Bliss, Rayford Halsted, MD - Patient presented for Acute cystitis without hematuria. Prescribed Ciprofloxacin HCL 250 mg.    11/07/20 Breanna Ped, RN - Patient presented for [facility] Nurse visit and coordination of needs.   10/03/20 Breanna Ped, RN - Patient presented for [facility] Nurse visit and coordination of needs.   09/16/20 Breanna Peng, NP - Patient presented for Essential hypertension and other concerns. No medication changes.  Recent consult visits: 02/09/21 Breanna Asencion Partridge, MD (Neurology) - Patient presented to [facility] for CVA and other concerns. No medication changes.   12/28/20 Breanna, Asencion Partridge, MD (Neurology) - Patient presented for CVA and other concerns. No medication changes.   12/27/20 Breanna, [name], PA-C Breanna [facility]) - Patient presented for Gastroesophageal reflux disease and other concerns. Changed time for Famotidine 20 mg to bedtime and Omeprazole 20 mg to before meals.   12/05/20 Breanna, Breanna Daughters [facility] - Patient presented for Type 2 diabetes mellitus with other diabetic neuro complication and other concerns. No medication changes.   11/24/20 Breanna R Little, PA ([facility]) -  Patient presented for COVID - 19 screening.   11/16/20 Breanna, Breanna Daughters [facility] - Patient presented for Type 2  diabetes mellitus with other diabetic neuro complication and other concerns. No medication changes.   11/09/20 Breanna, Breanna Daughters [facility] - Patient presented for Adult hypothyroidism and other concerns.No medication changes.   10/21/20 Breanna Webster (Family Med) - Patient presented for Unilateral primary osteoarthritis of right knee. X-Ray ordered, Bursa joint drained and Triamcinolone injection given.   10/17/20 Breanna Webster (Urology) - Patient presented for Acute cystitis without hematuria and other concerns. No other details available.   09/07/20 Breanna Genera, MD (Oncology) - Patient presented for Anemia and other concerns. No medication changes.   09/05/20 Breanna Webster (Physical Med) - Patient presented for Unilateral primary osteoarthritis of left hip. Aspiration and Injection of [place] done.   5/9//22 Breanna, [facility] - Patient presented for Adult hypothyroidism and other concerns.No medication changes.   08/29/20 Breanna Webster (Optometry) - Patient presented for PR Fundus Photography.   5/5//22 Breanna, Breanna Daughters (Endocinology) - Patient presented for Type 2 diabetes and other concerns.No medication changes.   08/25/20 Breanna Webster (Family Med) - Patient presented for Unilateral primary osteoarthritis of right knee and other concerns. X-Ray ordered, Bursa joint drained and Triamcinolone injection given.  Hospital visits: Medication Reconciliation was completed by comparing discharge summary, patient'Webster EMR and Pharmacy list, and upon discussion with patient.   Patient presented to [facility] on 09/08/20 due to Sepsis secondary to UTI. Patient was there for 4 days.   New?Medications Started at [facility] Discharge:?? -started Ciprofloxacin 500 mg twice a day for 6 days   Medication Changes at Hospital Discharge: -Changed None   Medications Discontinued at Hospital Discharge: -Stopped  Losartan 50 mg   Medications that remain the same after Hospital Discharge:??  -All other medications will remain the same.     Objective:  Lab Results  Component Value Date   CREATININE 0.7 12/05/2020   BUN 13 12/05/2020   GFR 84.65 09/16/2020   GFRNONAA 86 12/05/2020   GFRAA 99 12/05/2020   NA 140 12/05/2020   K 4.1 12/05/2020   CALCIUM 10.4 12/05/2020   CO2 28 (A) 12/05/2020   GLUCOSE 120 (H) 09/16/2020    Lab Results  Component Value Date/Time   HGBA1C 7.4 (H) 09/08/2020 04:16 AM   HGBA1C 6.5 05/05/2020 12:00 AM   HGBA1C 7.5 (H) 10/22/2018 04:45 AM   FRUCTOSAMINE 373 (H) 03/08/2016 11:43 AM   GFR 84.65 09/16/2020 02:43 PM   GFR 74.61 06/14/2017 08:26 AM   MICROALBUR 0.8 10/07/2015 01:30 PM   MICROALBUR 1.1 02/08/2014 02:49 PM    Last diabetic Eye exam:  Lab Results  Component Value Date/Time   HMDIABEYEEXA Retinopathy (A) 08/29/2020 12:00 AM    Last diabetic Foot exam: No results found for: HMDIABFOOTEX   Lab Results  Component Value Date   CHOL 188 12/05/2020   HDL 55 12/05/2020   LDLCALC 110 12/05/2020   LDLDIRECT 98.0 05/24/2014   TRIG 113 12/05/2020   CHOLHDL 5.3 10/22/2018    Hepatic Function Latest Ref Rng & Units 12/27/2020 12/05/2020 09/09/2020  Total Protein 6.0 - 8.3 g/dL 7.2 - 5.3(L)  Albumin 3.5 - 5.2 g/dL 4.1 4.1 2.7(L)  AST 0 - 37 U/L 28 34 28  ALT 0 - 35 U/L 36(H) 44(A) 48(H)  Alk Phosphatase 39 - 117 U/L 72 80 57  Total Bilirubin 0.2 - 1.2 mg/dL 0.9 - 0.8  Bilirubin, Direct 0.0 - 0.3 mg/dL 0.2 - -    Lab Results  Component Value Date/Time   TSH 1.44 12/05/2020 12:00 AM   TSH 1.47 05/05/2020 12:00 AM   TSH 5.935 (H) 04/30/2019 11:49 AM   TSH 9.460 (H) 11/12/2018 09:39 AM   TSH 0.322 (L) 10/21/2018 03:30 PM   TSH 0.79 08/05/2017 11:52 AM   FREET4 0.89 04/30/2019 11:49 AM   FREET4 1.78 (H) 10/21/2018 03:30 PM    CBC Latest Ref Rng & Units 09/16/2020 09/12/2020 09/11/2020  WBC 4.0 - 10.5 K/uL 8.5 4.6 3.9(L)  Hemoglobin 12.0 - 15.0  g/dL 9.6(L) 8.2(L) 9.3(L)  Hematocrit 36.0 - 46.0 % 29.9(L) 26.5(L) 29.1(L)  Platelets 150.0 - 400.0 K/uL 276.0 109(L) 95(L)    Lab Results  Component Value Date/Time   VD25OH 34.2 02/26/2017 11:16 AM   VD25OH 28.0 (L) 11/21/2016 11:13 AM   VD25OH 20.86 (L) 05/29/2016 11:16 AM    Clinical ASCVD: Yes  The ASCVD Risk score (Arnett DK, et al., 2019) failed to calculate for the following reasons:   The patient has a prior MI or stroke diagnosis    Depression screen [facility] 2/9 10/03/2020 03/14/2020 03/02/2020  Decreased Interest 0 0 0  Down, Depressed, Hopeless 0 0 1  PHQ - 2 Score 0 0 1  Altered sleeping - 0 -  Tired, decreased energy - 0 -  Change in appetite - 0 -  Feeling bad or failure about yourself  - 0 -  Trouble concentrating - 0 -  Moving slowly or fidgety/restless - 0 -  Suicidal thoughts - 0 -  PHQ-9 Score - 0 -  Difficult doing work/chores - Not difficult at all -  Some recent data might be hidden   CHA2DS2/VAS Stroke Risk Points  Current as of 15 minutes ago     9 >= 2 Points: High Risk  1 - 1.99 Points: Medium Risk  0 Points: Low Risk    Last Change: N/A      Details    This score determines the patient'Webster risk of having a stroke if the  patient has atrial fibrillation.       Points Metrics  1 Has Congestive Heart Failure:  Yes    Current as of 15 minutes ago  1 Has Vascular Disease:  Yes    Current as of 15 minutes ago  1 Has Hypertension:  Yes    Current as of 15 minutes ago  2 Age:  27    Current as of 15 minutes ago  1 Has Diabetes:  Yes    Current as of 15 minutes ago  2 Had Stroke:  Yes  Had TIA:  No  Had Thromboembolism:  No    Current as of 15 minutes ago  1 Female:  Yes    Current as of 15 minutes ago            Social History   Tobacco Use  Smoking Status Never  Smokeless Tobacco Never   BP Readings from Last 3 Encounters:  12/28/20 116/74  12/27/20 126/60  11/23/20 130/70   Pulse Readings from Last 3 Encounters:  12/28/20 75   12/27/20 82  12/08/20 76   Wt Readings from Last 3 Encounters:  12/28/20 178 lb 8 oz (81 kg)  12/27/20 176 lb (79.8 kg)  12/08/20 179 lb (81.2 kg)   BMI Readings from Last 3 Encounters:  12/28/20 29.70 kg/m  12/27/20 29.29 kg/m  12/08/20 29.79 kg/m  Assessment/Interventions: Review of patient past medical history, allergies, medications, health status, including review of consultants reports, laboratory and other test data, was performed as part of comprehensive evaluation and provision of chronic care management services.   SDOH:  (Social Determinants of Health) assessments and interventions performed: Yes SDOH Interventions    Flowsheet Row Most Recent Value  SDOH Interventions   Financial Strain Interventions Intervention Not Indicated  Transportation Interventions Intervention Not Indicated      SDOH Screenings   Alcohol Screen: Low Risk    Last Alcohol Screening Score (AUDIT): 0  Depression (PHQ2-9): Low Risk    PHQ-2 Score: 0  Financial Resource Strain: Low Risk    Difficulty of Paying Living Expenses: Not very hard  Food Insecurity: No Food Insecurity   Worried About Charity fundraiser in the Last Year: Never true   Ran Out of Food in the Last Year: Never true  Housing: Low Risk    Last Housing Risk Score: 0  Physical Activity: Insufficiently Active   Days of Exercise per Week: 2 days   Minutes of Exercise per Session: 50 min  Social Connections: Engineer, building services of Communication with Friends and Family: More than three times a week   Frequency of Social Gatherings with Friends and Family: Once a week   Attends Religious Services: More than 4 times per year   Active Member of [employer] or Organizations: Yes   Attends Music therapist: More than 4 times per year   Marital Status: Married  Stress: Stress Concern Present   Feeling of Stress : To some extent  Tobacco Use: Low Risk    Smoking Tobacco Use: Never   Smokeless Tobacco  Use: Never   Passive Exposure: Not on file  Transportation Needs: No Transportation Needs   Lack of Transportation (Medical): No   Lack of Transportation (Non-Medical): No   Patient reports Dr Forbes Cellar has started her on Estradial and Myrbetriq.   Patient reports she is extremely lethargic and could take a nap twice and day and go to sleep at night and not have a problem. Patient noticed this about 6 months ago and never really recovered from her strokes about 2 years ago and from [place].  Patient just had another sleep study because she was having issues with her CPAP machine. She is waiting to get the results of her sleep study. Patient is getting up twice a night to go to the bathroom. Patient was tired before she starting using the machine. Patient goes to bed around the same time each night and does watch TV before bedtime.  Patient is having issues with estradiol cream right now as she is not sure about the amount she should be using.   CCM Care Plan  Allergies  Allergen Reactions   Statins Other (See Comments)    Muscle aches    Medications Reviewed Today     Reviewed by Breanna [facility], [facility] (Pharmacist) on 02/20/21 at 1209  Med List Status: <None>   Medication Order Taking? Sig Documenting Provider Last Dose Status Informant  BD INSULIN SYRINGE U/F 31G X 5/16" 1 ML MISC [id number]  FOUR TIMES A DAY UNDER THE SKIN Nafziger, Tommi Rumps, NP  Active Self  BD PEN NEEDLE NANO 2ND GEN 32G X 4 MM MISC [id number]   [provider]  Active   cholecalciferol (VITAMIN D) 25 MCG (1000 UNIT) tablet [id number] Yes Take 1,000 Units by mouth daily. [provider] Taking Active Self  diltiazem (CARDIZEM CD) 120 MG 24 hr capsule [id number] Yes TAKE ONE CAPSULE BY MOUTH DAILY Nafziger, Tommi Rumps, NP Taking Active   ELIQUIS 5 MG TABS tablet [id number] Yes TAKE ONE TABLET BY MOUTH TWICE A DAY Nafziger, Cory, NP Taking Active   escitalopram (LEXAPRO) 20 MG tablet [id number] Yes Take 20 mg by  mouth daily. [provider] Taking Active Self  estradiol (ESTRACE) 0.1 MG/GM vaginal cream [id number] Yes Place vaginally. [provider] Taking Active   Lancets [name] DELICA PLUS [id number]) [place] [id number]  USE TO CHECK BLOOD SUGAR TWICE DAILY.Marland Kitchen Renato Shin, MD  Active Self  levothyroxine (SYNTHROID) 137 MCG tablet [id number] Yes Take 137 mcg by mouth once. [provider] Taking Active   losartan (COZAAR) 50 MG tablet [id number] Yes Take 50 mg by mouth daily. [provider] Taking Active   metFORMIN (GLUCOPHAGE-XR) 500 MG 24 hr tablet [id number] Yes Take 500 mg by mouth 2 (two) times daily. [provider] Taking Active Self  mirabegron ER (MYRBETRIQ) 50 MG TB24 tablet [id number] Yes Take 50 mg by mouth daily. [provider] Taking Active   Multiple Vitamin (MULITIVITAMIN WITH MINERALS) TABS [id number] Yes Take 1 tablet by mouth daily. [provider] Taking Active Self  nitroGLYCERIN (NITROSTAT) 0.4 MG SL tablet [id number] No Place 1 tablet (0.4 mg total) under the tongue every 5 (five) minutes as needed for chest pain.  Patient not taking: Reported on 02/20/2021   Breanna Crome, MD Not Taking Active   NOVOLIN N 100 UNIT/ML injection [id number] Yes Inject 28 Units into the skin daily before breakfast. [provider] Taking Active Self  NOVOLIN R 100 UNIT/ML injection [id number] Yes 8 Units 2 (two) times daily before a meal. 10 UNITS THIRTY MINUTES BEFORE MEALS.  5 ADDITIONAL UNITS WITH CARBS OR SNACKS. [provider] Taking Active Self  omeprazole (PRILOSEC) 20 MG capsule [id number] No Take 1 capsule (20 mg total) by mouth 2 (two) times daily before a meal.  Patient not taking: Reported on 02/20/2021   Alfredia Ferguson, PA-C Not Taking Active   OneTouch Delica Lancets 26O MISC [id number]  Tests BS 2 times [provider]  Active   [facility] VERIO test strip [id number]  USE TO MONITOR GLUCOSE LEVELS TWICE  DAILY Renato Shin, MD  Active Self            Patient Active Problem List   Diagnosis Date Noted   COVID-19 02/14/2021   Postviral fatigue syndrome 02/14/2021   Difficulty with adaptive servo-ventilation (ASV) use 02/14/2021   Sepsis secondary to UTI ([place]) 09/08/2020   Elevated ALT measurement 09/08/2020   History of CVA with residual deficit 09/08/2020   Hyperbilirubinemia 09/08/2020   Fatigue associated with anemia 08/03/2020   Cerebrovascular accident (CVA) due to embolism of right posterior cerebral artery ([place]) 08/03/2020   OSA treated with BiPAP 08/03/2020   Complex sleep apnea syndrome 08/03/2020   Treatment-emergent central sleep apnea 08/03/2020   Chronic intermittent hypoxia with obstructive sleep apnea 04/21/2020   OSA (obstructive sleep apnea) 04/21/2020   History of cardioembolic stroke 07/12/2246   Gait disturbance, post-stroke 03/29/2020   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 ([place]) 03/29/2020   Anxiety    RLQ abdominal pain 10/22/2019   Paroxysmal atrial fibrillation ([place]) 05/22/2019   Iron deficiency anemia 05/07/2019   Atrial fibrillation with RVR ([place]) 10/21/2018   Cerebellar stroke, acute ([place]) 10/21/2018   Streptococcal endocarditis    Endocarditis of mitral valve 10/07/2018   Bacteremia due to  Streptococcus Salivarius 10/07/2018   Sepsis ([place]) 50/35/4656   Acute metabolic encephalopathy 81/27/5170   Severe aortic stenosis 07/08/2018   [place]/P TAVR (transcatheter aortic valve replacement) 07/08/2018   Esophageal thickening    CAD in native artery 04/22/2018   Gastroesophageal reflux disease    Pulmonary hypertension ([place]) 02/21/2018   Essential hypertension 07/15/2017   History of colonic polyps 05/22/2017   Elevated liver function tests 12/05/2016   DOE (dyspnea on exertion) 07/19/2016   Thalassemia minor 05/29/2016   Left bundle branch block 12/06/2015   Upper airway cough syndrome 10/14/2015   Myalgia 02/17/2014   Carotid artery  stenosis 06/05/2013   Eustachian tube dysfunction 05/07/2013   Neuropathy of leg 03/07/2012   Anemia, unspecified 01/31/2012   Hot flashes 08/24/2010   Diabetes mellitus due to underlying condition, uncontrolled 06/30/2010   Generalized abdominal pain 03/24/2010   Obesity (BMI 30.0-34.9) 12/21/2009   IBS (irritable bowel syndrome) 04/29/2009   Vitamin D deficiency 03/10/2009   Hypothyroidism 12/13/2008   Dyslipidemia 12/13/2008   Anxiety state 12/13/2008   Other specified disorders of bladder 12/13/2008    Immunization History  Administered Date(Webster) Administered   Fluad Quad(high Dose 65+) 01/20/2019, 01/06/2020   Hep A / Hep B 06/15/2015, 07/15/2015, 12/27/2015   Influenza Split 01/16/2012   Influenza Whole 01/31/2010   Influenza, High Dose Seasonal PF 01/28/2014, 12/29/2014, 02/17/2016, 01/21/2018   Influenza-Unspecified 12/29/2012   PFIZER(Purple Top)SARS-COV-2 Vaccination 05/16/2019, 06/06/2019, 02/06/2020   Pneumococcal Conjugate-13 06/14/2017   Pneumococcal Polysaccharide-23 01/28/2012, 01/28/2014   Pneumococcal-Unspecified 04/24/2015   Tdap 12/29/2014   Zoster Recombinat (Shingrix) 03/13/2017, 05/24/2017   Zoster, Live 02/14/2012    Conditions to be addressed/monitored:  Hypertension, Hyperlipidemia, Diabetes, Atrial Fibrillation, Coronary Artery Disease, GERD, Hypothyroidism, Overactive Bladder, and Neuropathy  Care Plan : CCM Pharmacy Care Plan  Updates made by Breanna [place], [place] since 02/20/2021 12:00 AM     Problem: Problem: Hypertension, Hyperlipidemia, Diabetes, Atrial Fibrillation, Coronary Artery Disease, GERD, Hypothyroidism, Overactive Bladder, and Neuropathy      Long-Range Goal: Patient-Specific Goal   Start Date: 02/20/2021  Expected End Date: 02/20/2022  This Visit'Webster Progress: On track  Priority: High  Note:   Current Barriers:  Unable to independently monitor therapeutic efficacy Unable to maintain control of overactive bladder Suboptimal  therapeutic regimen for diabetes  Pharmacist Clinical Goal(Webster):  Patient will achieve adherence to monitoring guidelines and medication adherence to achieve therapeutic efficacy maintain control of diabetes as evidenced by A1c  through collaboration with PharmD and provider.   Interventions: 1:1 collaboration with Breanna Peng, NP regarding development and update of comprehensive plan of care as evidenced by provider attestation and co-signature Inter-disciplinary care team collaboration (see longitudinal plan of care) Comprehensive medication review performed; medication list updated in electronic medical record  Hypertension (BP goal <130/80) -Controlled -Current treatment: Losartan 50 mg 1 tablet daily Diltiazem 120 mg 1 capsule daily -Medications previously tried: n/a -Current home readings: 135/70 (normal - that his high for her) - 110-120/70 (wrist cuff) -Current dietary habits: not a salt person; cooking at home; pre-packed vegetables (no canned vegetables) -Current exercise habits: not been able to exercise because she cannot walk with knees and back -Denies hypotensive/hypertensive symptoms -Educated on Importance of home blood pressure monitoring; Proper BP monitoring technique; Symptoms of hypotension and importance of maintaining adequate hydration; -Counseled to monitor BP at home weekly, document, and provide log at future appointments -Counseled on diet and exercise extensively Recommended to continue current medication Recommended purchasing an arm blood pressure cuff to  ensure accuracy.  Hyperlipidemia: (LDL goal < 70) -Uncontrolled -Current treatment: No medications -Medications previously tried: statins (muscle aches), Zetia -Current dietary patterns: did not discuss -Current exercise habits: unable to -Educated on Cholesterol goals;  -Counseled on diet and exercise extensively [place] at follow up.  CAD/History of stroke (Goal: prevent future  events) -Uncontrolled -Current treatment  Eliquis 5 mg 1 tablet twice daily Nitroglycerin 0.4 mg 1 tablet as needed -Medications previously tried: aspirin  -Recommended to continue current medication Recommended checking expiration date for nitroglycerin.  Diabetes (A1c goal <7%) -Controlled (per patient report of recent A1c of 5.9%) -Current medications: Novolin N 28 units before breakfast Novolin R 8 units in morning and at night Metformin XR 500 mg 1 tablet twice daily -Medications previously tried: other insulins (switched due to cost) -Current home glucose readings fasting glucose: 109, 167, 169, 137, 162, 160 (twice a day) post prandial glucose: 152, 154, 129, 187 (right before dinner) -Reports hypoglycemic/hyperglycemic symptoms (lethargy) -Current meal patterns:  breakfast: bran cereal or 2 eggs and toast or yogurt or croissant with cheese  lunch: not taking insulin in afternoon  dinner: meat and vegetables (varies) snacks: n/a drinks: water; stopped drinking ice brand -Current exercise: unable to -Educated on A1c and blood sugar goals; Prevention and management of hypoglycemic episodes; Benefits of routine self-monitoring of blood sugar; Carbohydrate counting and/or plate method -Counseled to check feet daily and get yearly eye exams -Counseled on diet and exercise extensively Recommended to continue current medication Recommended discussion with endo about starting a Dexcom or Freestyle Libre and switching insulins to newer and longer lasting ones (Basaglar and Humalog)  Atrial Fibrillation (Goal: prevent stroke and major bleeding) -Controlled -CHADSVASC: 9 -Current treatment: Rate control: Diltiazem 120 mg 1 capsule daily Anticoagulation: Eliquis 5 mg 1 tablet twice daily -Medications previously tried: none -Home BP and HR readings: normal at home is 68 -Counseled on increased risk of stroke due to Afib and benefits of anticoagulation for stroke  prevention; bleeding risk associated with Eliquis and importance of self-monitoring for signs/symptoms of bleeding; avoidance of NSAIDs due to increased bleeding risk with anticoagulants; -Recommended to continue current medication  Depression/Anxiety (Goal: minimize symptoms) -Controlled -Current treatment: Escitalopram 20 mg 1 tablet daily -Medications previously tried/failed: unknown -PHQ9: 0 -GAD7: n/a -Educated on Benefits of medication for symptom control Benefits of cognitive-behavioral therapy with or without medication -Recommended to continue current medication   Hypothyroidism (Goal: TSH 2.5-4.5) -Not ideally controlled -Current treatment  Levothyroxine 137 mcg 1 tablet every morning before breakfast -Medications previously tried: none  -Recommended repeat TSH due to fatigue.  Overactive bladder (Goal: minimize symptoms) -Controlled -Current treatment  Myrbetriq 50 mg 1 tablet daily -Medications previously tried: none  -Recommended moving to bedtime to see if this helps frequent bathroom trips overnight.  GERD/Barrett'Webster esophagus (Goal: minimize symptoms and protect esophagus) -Not ideally controlled -Current treatment  Omeprazole 20 mg 1 capsule twice daily - stopped taking Famotidine 20 mg 1 tablet at bedtime - stopped taking -Medications previously tried: none  -Counseled on benefits of taking omeprazole with Barrett'Webster esophagus. Recommended to restart omeprazole at least once daily.  Osteopenia (Goal prevent fractures) -Controlled -Last DEXA Scan: 04/08/2017   T-Score femoral neck: -1.8  T-Score total hip: n/a  T-Score lumbar spine: -2.0  T-Score forearm radius: n/a  10-year probability of major osteoporotic fracture: 11.3%  10-year probability of hip fracture: 2.3% -Patient is not a candidate for pharmacologic treatment -Current treatment  Vitamin D 1000 units daily -Medications previously tried: none  -  Recommend [phone] units of vitamin D daily.  Recommend 1200 mg of calcium daily from dietary and supplemental sources. Recommend weight-bearing and muscle strengthening exercises for building and maintaining bone density. -Recommended repeat DEXA.  Osteoarthritis (Goal: minimize pain) -Uncontrolled -Current treatment  Acetaminophen 500 mg 2 capsules as needed -Medications previously tried: n/a  -Recommended trial of Voltaren gel for knee pain.  Health Maintenance -Vaccine gaps: influenza, COVID booster -Current therapy:  Multivitamin 1 tablet daily Vitamin B complex daily -Educated on Cost vs benefit of each product must be carefully weighed by individual consumer -Patient is satisfied with current therapy and denies issues -Recommended to continue current medication  Patient Goals/Self-Care Activities Patient will:  - check glucose at least daily, document, and provide at future appointments check blood pressure weekly, document, and provide at future appointments target a minimum of 150 minutes of moderate intensity exercise weekly  Follow Up Plan: The care management team will reach out to the patient again over the next 30 days.         Medication Assistance:  Insulins obtained through [employer] medication assistance program.  Enrollment ends 04/21/21  Compliance/Adherence/Medication fill history: Care Gaps: Foot Exam - Overdue COVID Booster #4 Therapist, music) - Overdue Flu Vaccine - Overdue A1C 7.4 BP- 116/74  Star-Rating Drugs: Metformin (Glucophage) 500 mg - Last filled 11/21/20 90 DS at [facility] Losartan (Cozaar) 50 mg - Last filled 01/19/21 90 DS at [facility]  Patient'[facility] preferred pharmacy is:  [id number] - [address], [address] [place] [address] Phone: [phone] Fax: [phone]  Uses pill box? Yes Pt endorses 90% compliance  We discussed: Current pharmacy is preferred with insurance plan and patient is satisfied with pharmacy  services Patient decided to: Continue current medication management strategy  Care Plan and Follow Up Patient Decision:  Patient agrees to Care Plan and Follow-up.  Plan: The care management team will reach out to the patient again over the next 30 days.  [name], PharmD, [place] Pharmacist [facility]

## 2021-02-20 NOTE — Progress Notes (Signed)
Per [name] call to patients insurance spoke to [place] whom advised that [facility] is in network and should be covered. She stated they did not list preferred or not. Advised Clinical Pharmacist of the above.  [facility] Clinical Pharmacist Assistant [phone]

## 2021-02-28 DIAGNOSIS — E039 Hypothyroidism, unspecified: Secondary | ICD-10-CM | POA: Diagnosis not present

## 2021-02-28 DIAGNOSIS — Z6829 Body mass index (BMI) 29.0-29.9, adult: Secondary | ICD-10-CM | POA: Diagnosis not present

## 2021-02-28 DIAGNOSIS — Z794 Long term (current) use of insulin: Secondary | ICD-10-CM | POA: Diagnosis not present

## 2021-02-28 DIAGNOSIS — I2510 Atherosclerotic heart disease of native coronary artery without angina pectoris: Secondary | ICD-10-CM | POA: Diagnosis not present

## 2021-02-28 DIAGNOSIS — I4891 Unspecified atrial fibrillation: Secondary | ICD-10-CM | POA: Diagnosis not present

## 2021-02-28 DIAGNOSIS — Z952 Presence of prosthetic heart valve: Secondary | ICD-10-CM | POA: Diagnosis not present

## 2021-02-28 DIAGNOSIS — E1149 Type 2 diabetes mellitus with other diabetic neurological complication: Secondary | ICD-10-CM | POA: Diagnosis not present

## 2021-02-28 DIAGNOSIS — Z8673 Personal history of transient ischemic attack (TIA), and cerebral infarction without residual deficits: Secondary | ICD-10-CM | POA: Diagnosis not present

## 2021-03-01 ENCOUNTER — Ambulatory Visit: Payer: Medicare Other | Admitting: Internal Medicine

## 2021-03-02 ENCOUNTER — Other Ambulatory Visit: Payer: Self-pay | Admitting: Gastroenterology

## 2021-03-02 ENCOUNTER — Encounter: Payer: Self-pay | Admitting: Gastroenterology

## 2021-03-02 ENCOUNTER — Ambulatory Visit (INDEPENDENT_AMBULATORY_CARE_PROVIDER_SITE_OTHER): Payer: Medicare Other | Admitting: Gastroenterology

## 2021-03-02 ENCOUNTER — Ambulatory Visit (INDEPENDENT_AMBULATORY_CARE_PROVIDER_SITE_OTHER)
Admission: RE | Admit: 2021-03-02 | Discharge: 2021-03-02 | Disposition: A | Discharge status: Home / Self Care | Payer: Medicare Other | Source: Ambulatory Visit | Attending: Internal Medicine | Admitting: Internal Medicine

## 2021-03-02 ENCOUNTER — Other Ambulatory Visit: Payer: Self-pay

## 2021-03-02 VITALS — BP 118/60 | HR 84 | Ht 65.0 in | Wt 178.0 lb

## 2021-03-02 DIAGNOSIS — K760 Fatty (change of) liver, not elsewhere classified: Secondary | ICD-10-CM

## 2021-03-02 DIAGNOSIS — I517 Cardiomegaly: Secondary | ICD-10-CM | POA: Diagnosis not present

## 2021-03-02 DIAGNOSIS — K8689 Other specified diseases of pancreas: Secondary | ICD-10-CM

## 2021-03-02 DIAGNOSIS — R059 Cough, unspecified: Secondary | ICD-10-CM | POA: Diagnosis not present

## 2021-03-02 DIAGNOSIS — K219 Gastro-esophageal reflux disease without esophagitis: Secondary | ICD-10-CM | POA: Diagnosis not present

## 2021-03-02 DIAGNOSIS — K2270 Barrett's esophagus without dysplasia: Secondary | ICD-10-CM | POA: Diagnosis not present

## 2021-03-02 DIAGNOSIS — I2510 Atherosclerotic heart disease of native coronary artery without angina pectoris: Secondary | ICD-10-CM

## 2021-03-02 DIAGNOSIS — J811 Chronic pulmonary edema: Secondary | ICD-10-CM | POA: Diagnosis not present

## 2021-03-02 DIAGNOSIS — R0602 Shortness of breath: Secondary | ICD-10-CM | POA: Diagnosis not present

## 2021-03-02 NOTE — Patient Instructions (Addendum)
Please go to the x-ray department before leaving today.  Increase your omeprazole to one pill twice a day for several weeks and see if that helps.  If you are age 77 or older, your body mass index should be between 23-30. Your Body mass index is 29.62 kg/m. If this is out of the aforementioned range listed, please consider follow up with your Primary Care Provider.  If you are age 58 or younger, your body mass index should be between 19-25. Your Body mass index is 29.62 kg/m. If this is out of the aformentioned range listed, please consider follow up with your Primary Care Provider.   ________________________________________________________  The [facility] providers would like to encourage you to use [facility] to communicate with providers for non-urgent requests or questions.  Due to long hold times on the telephone, sending your provider a message by [facility] may be a faster and more efficient way to get a response.  Please allow 48 business hours for a response.  Please remember that this is for non-urgent requests.  _______________________________________________________  Thank you for entrusting me with your care and for choosing [facility], Dr. Winchester Cellar

## 2021-03-02 NOTE — Progress Notes (Signed)
HPI :  75 y/o female here for a follow up visit - I have followed her in the past for suspected cirrhosis related to fatty liver, short segment BE, history of colon polyps. In the recent years she has had a CVA, underwent TAVR, on Eliquis.   Recall she has been thought to have cirrhosis related to fatty liver based on multiple ultrasound imaging studies. She has been compensated over time. She underwent a serologic evaluation in 2017 which was negative.  She does not drink any alcohol.  She denies any family history of cirrhosis.  She has had no issues with ascites, jaundice, encephalopathy, nor varices.  She was immunized to hepatitis A and B in 2017.  She last had imaging of her abdomen with an ultrasound in Sept 2022, liver was cirrhotic with a 2 cm nodule for which MRI was recommended.  She underwent an MRI of her abdomen with and without contrast on October 15 of this year.  Interestingly there is no concerning nodule in her liver but also her liver contour is smooth and there is no obvious cirrhosis on the MRI.  Her spleen is normal.  She had severe fatty change of the pancreas.  She has questions about the findings.  She also had a CT scan in May of this year which argue that she did not have cirrhosis either.  On review of her imaging studies she has had a fatty pancreas since 2006.  She states she had COVID in August.  She was seen in September for some symptoms of cough.  She states the cough has persisted although unclear how long this is actually been going on.  She states she brings up some green sputum at times.  She denies any postnasal drainage.  She does have an intermittent cough, states she feels herself wheezing at times.  She is on omeprazole 20 mg a day for her heartburn/history of Barrett's esophagus.  She states this controls her heartburn pretty well she is not having any breakthrough heartburn or regurgitation.  She inquires if reflux is causing her cough or not.  Her last EGD was  2 years ago which showed stable changes of Barrett's.  She otherwise had 28 adenomas removed on colonoscopy in 2018. She had a follow-up colonoscopy with me in October 2020, she had an additional 8 small adenomas removed, it was recommended she have a repeat colonoscopy in 3 years.    Endoscopic history: EGD 02/03/19 - irregular zline, biopsies c/w nondysplastic BE, loose nissen, no varices, benign duodenal polyp Colonoscopy 02/03/19 - 8 small adenomas, diverticulosis, lipoma EGD 03/20/2017 - irregular z line, LA grade B esophagitis, small paraesophageal hernia, loose Nissen wrap, mild gastritis, duodenal erosions - path c/w Barrett's, H pylori negative  Colonoscopy 03/20/2017 - pancolonic diverticulosis, 28 polyps removed -  EGD 2002 - suspected segment of BE, no BE on biopsies EGD 2006 - esophagitis and gastritis, no evidence of BE Colonoscopy 5/05 - diverticulosis Colonoscopy 4/15 - 4 small adenomas   Prior imaging: [name] 06/17/19 - cirrhotic liver without focal mass [name] 03/16/19 - suggestive of cirrhosis, post chole CT scan 06/17/18 - thickening of the esophagus unchanged from 2013, "finely irregular liver surface, cannot exclude cirrhosis", no mass lesions US abdomen - 06/13/2015 - fatty liver    [name] 01/04/21 - IMPRESSION: Signs of cirrhosis with borderline splenomegaly. Hypoechoic area in the medial segment of the LEFT hepatic lobe suspicious for focal hepatic lesion in the setting of cirrhosis. Suggest hepatic MRI with  and without contrast for further evaluation.   Marked renal cortical scarring on the RIGHT.  MRI abdomen with and without contrast 02/04/21: IMPRESSION: 1. No definite MR findings for cirrhosis. No hepatic lesions or fatty infiltration of the liver. 2. No acute abdominal findings, mass lesions or adenopathy. 3. Severe fatty change involving the pancreas. 4. Status post cholecystectomy. No common bile duct stones or biliary dilatation.    Past Medical History:   Diagnosis Date   Anxiety    Arthritis    "back" (04/22/2018)   Back pain    Blood transfusion without reported diagnosis    CAD (coronary artery disease)    a. 03/2018 s/p PCI/DES to the RCA (3.0x15 Onyx DES).   Carotid artery stenosis    Mild   Chest pain    Chronic lower back pain    Cirrhosis (HCC)    Colon polyps    Diverticulitis    Diverticulosis    Esophageal thickening    seen on pre TAVR CT scan, also questionable cirrhosis. MRI recommended. Will refer to GI   Fatty liver    GERD (gastroesophageal reflux disease)    Grave's disease    History of colonic polyps 05/22/2017   History of hiatal hernia    Hypertension    Hypothyroidism    IBS (irritable bowel syndrome)    Osteopenia    Pulmonary nodules    seen on pre TAVR CT. likley benign. no follow up recommended if pt low risk.   S/P TAVR (transcatheter aortic valve replacement)    Severe aortic stenosis    Shortness of breath on exertion    Stroke ([place])    Thalassemia minor    Thyroid disease    Type II diabetes mellitus ([place])      Past Surgical History:  Procedure Laterality Date   88 HOUR [place] STUDY N/A 03/03/2018   Procedure: 24 HOUR PH STUDY;  Surgeon: Mauri Pole, MD;  Location: [facility];  Service: Endoscopy;  Laterality: N/A;   AORTIC VALVE REPLACEMENT  06/2018   COLONOSCOPY     COLONOSCOPY W/ BIOPSIES AND POLYPECTOMY     CORONARY ANGIOGRAPHY Right 04/21/2018   Procedure: CORONARY ANGIOGRAPHY (CATH LAB);  Surgeon: [name], MD;  Location: [facility];  Service: Cardiovascular;  Laterality: Right;   CORONARY STENT INTERVENTION N/A 04/22/2018   Procedure: CORONARY STENT INTERVENTION;  Surgeon: [name], MD;  Location: [facility];  Service: Cardiovascular;  Laterality: N/A;   DILATION AND CURETTAGE OF UTERUS     ESOPHAGEAL MANOMETRY N/A 03/03/2018   Procedure: ESOPHAGEAL MANOMETRY (EM);  Surgeon: Mauri Pole, MD;  Location: [facility];  Service: Endoscopy;   Laterality: N/A;   GASTRIC FUNDOPLICATION     HERNIA REPAIR     HYSTEROSCOPY     fibroids   LAPAROSCOPIC CHOLECYSTECTOMY     LAPAROSCOPY     fibroids   NISSEN FUNDOPLICATION  4008Q   POLYPECTOMY     RIGHT/LEFT HEART CATH AND CORONARY ANGIOGRAPHY N/A 02/20/2018   Procedure: RIGHT/LEFT HEART CATH AND CORONARY ANGIOGRAPHY;  Surgeon: [name], MD;  Location: [facility];  Service: Cardiovascular;  Laterality: N/A;   TEE WITHOUT CARDIOVERSION N/A 07/08/2018   Procedure: TRANSESOPHAGEAL ECHOCARDIOGRAM (TEE);  Surgeon: Burnell Blanks, MD;  Location: [facility];  Service: Open Heart Surgery;  Laterality: N/A;   TEE WITHOUT CARDIOVERSION  10/07/2018   TEE WITHOUT CARDIOVERSION N/A 10/07/2018   Procedure: TRANSESOPHAGEAL ECHOCARDIOGRAM (TEE);  Surgeon: Marlou Porch,  Thana Farr, MD;  Location: [facility];  Service: Cardiovascular;  Laterality: N/A;   TONSILLECTOMY     TRANSCATHETER AORTIC VALVE REPLACEMENT, TRANSFEMORAL N/A 07/08/2018   Procedure: TRANSCATHETER AORTIC VALVE REPLACEMENT, TRANSFEMORAL;  Surgeon: Burnell Blanks, MD;  Location: [facility];  Service: Open Heart Surgery;  Laterality: N/A;   Family History  Adopted: Yes  Problem Relation Age of Onset   Healthy Son        x 2   Headache Other        Cluster headaches   Heart failure Mother    Colon cancer Neg Hx    Pancreatic cancer Neg Hx    Rectal cancer Neg Hx    Stomach cancer Neg Hx    Social History   Tobacco Use   Smoking status: Never   Smokeless tobacco: Never  Vaping Use   Vaping Use: Never used  Substance Use Topics   Alcohol use: No    Alcohol/week: 0.0 standard drinks   Drug use: Never   Current Outpatient Medications  Medication Sig Dispense Refill   b complex vitamins capsule Take 1 capsule by mouth daily.     BD INSULIN SYRINGE U/F 31G X 5/16" 1 ML MISC FOUR TIMES A DAY UNDER THE SKIN 200 each 1   BD PEN NEEDLE NANO 2ND GEN 32G X 4 MM MISC      cholecalciferol (VITAMIN D) 25 MCG (1000  UNIT) tablet Take 1,000 Units by mouth daily.     diltiazem (CARDIZEM CD) 120 MG 24 hr capsule TAKE ONE CAPSULE BY MOUTH DAILY 90 capsule 1   ELIQUIS 5 MG TABS tablet TAKE ONE TABLET BY MOUTH TWICE A DAY 60 tablet 2   escitalopram (LEXAPRO) 20 MG tablet Take 20 mg by mouth daily.     estradiol (ESTRACE) 0.1 MG/GM vaginal cream Place vaginally.     Lancets (ONETOUCH DELICA PLUS [id number]) MISC USE TO CHECK BLOOD SUGAR TWICE DAILY.. 200 each 0   levothyroxine (SYNTHROID) 137 MCG tablet Take 137 mcg by mouth once.     losartan (COZAAR) 50 MG tablet Take 50 mg by mouth daily.     metFORMIN (GLUCOPHAGE-XR) 500 MG 24 hr tablet Take 500 mg by mouth 2 (two) times daily.     mirabegron ER (MYRBETRIQ) 50 MG TB24 tablet Take 50 mg by mouth daily.     Multiple Vitamin (MULITIVITAMIN WITH MINERALS) TABS Take 1 tablet by mouth daily.     nitroGLYCERIN (NITROSTAT) 0.4 MG SL tablet Place 1 tablet (0.4 mg total) under the tongue every 5 (five) minutes as needed for chest pain. 25 tablet 3   NOVOLIN N 100 UNIT/ML injection Inject 28 Units into the skin daily before breakfast.     NOVOLIN R 100 UNIT/ML injection 8 Units 2 (two) times daily before a meal. 10 UNITS THIRTY MINUTES BEFORE MEALS.  5 ADDITIONAL UNITS WITH CARBS OR SNACKS.     omeprazole (PRILOSEC) 20 MG capsule Take 1 capsule (20 mg total) by mouth 2 (two) times daily before a meal. 60 capsule 3   OneTouch Delica Lancets 60Y MISC Tests BS 2 times     ONETOUCH VERIO test strip USE TO MONITOR GLUCOSE LEVELS TWICE DAILY 200 strip 3   No current facility-administered medications for this visit.   Allergies  Allergen Reactions   Statins Other (See Comments)    Muscle aches     Review of Systems: All systems reviewed and negative except where noted in HPI.   Lab Results  Component Value Date   ALT 36 (H) 12/27/2020   AST 28 12/27/2020   ALKPHOS 72 12/27/2020   BILITOT 0.9 12/27/2020    Lab Results  Component Value Date   INR 1.6 (H)  12/27/2020   INR 1.8 (H) 09/08/2020   INR 1.2 04/19/2020      Physical Exam: BP 118/60   Pulse 84   Ht 5' 5"  (1.651 m)   Wt 178 lb (80.7 kg)   BMI 29.62 kg/m  normal pulse ox Constitutional: Pleasant,well-developed, female in no acute distress. Pulmonary/chest: Effort normal and breath sounds normal. No wheezing, rales or rhonchi. Neurological: Alert and oriented to person place and time. Skin: Skin is warm and dry. No rashes noted. Psychiatric: Normal mood and affect. Behavior is normal.   ASSESSMENT AND PLAN: 75 year old female here for reassessment of following:  GERD Barrett's esophagus without dysplasia Cough Fatty liver Fatty pancreas  Patient's main concern is her ongoing cough, post COVID.  She is having some green sputum production over weeks and subjective wheezing but her lungs sound clear to me on exam today.  Her typical heartburn symptoms are well controlled.  I do not think that GERD is driving her cough based on how she describes it today.  She can try increasing her omeprazole to twice daily for a few weeks to see if that would help, but she should probably follow-up with her primary care and/or pulmonologist for reassessment of this.  I will check a chest x-ray today given her subjective complaints to make sure no acute process otherwise.  She is due for surveillance endoscopy next year.   Otherwise based on prior imaging studies it was suspected she had cirrhosis due to fatty liver and has been compensated over time.  [place] screening showed a small nodule in her liver which led to a follow-up MRI.  Interestingly the MRI shows her liver is normal, no nodule, normal spleen.  She also had a CT scan earlier this year showing that the liver appeared normal.  Prior CT scan suggested cirrhosis.  She has had some conflicting imaging however on recent imaging and argues against cirrhosis.  This is very good news if true.  We will keep an eye on this moving forward.  Otherwise  she asks about fatty pancreas noted on her imaging.  This is been present in her pancreas dating back to 2006 and appears stable.  Reassured her no mass lesion or concerning process otherwise.  Plan: - CXR PA/LAT today - can trial increasing omeprazole to BID but I think it less likely cough is being driven by GERD, perhaps post COVID. She can follow up with PCP / pulmonary if this persists - reviewed imaging at length, reassured her about fatty pancreas and her liver does not show overt cirrhosis on 2 imaging studies this year. This is excellent news if so, will monitor - follow up in 6 months  Jolly Mango, MD [facility]

## 2021-03-03 ENCOUNTER — Other Ambulatory Visit: Payer: Self-pay | Admitting: Obstetrics

## 2021-03-03 DIAGNOSIS — Z1231 Encounter for screening mammogram for malignant neoplasm of breast: Secondary | ICD-10-CM

## 2021-03-06 ENCOUNTER — Telehealth: Payer: Self-pay | Admitting: Gastroenterology

## 2021-03-06 ENCOUNTER — Encounter: Payer: Self-pay | Admitting: Neurology

## 2021-03-06 NOTE — Telephone Encounter (Signed)
Patient called wanting to know the results of her recent x-ray.  Please call.  If she doesn't answer the phone, she said it's OK to leave a detailed message for her.  Thank you.

## 2021-03-06 NOTE — Telephone Encounter (Signed)
Called and spoke with patient in regards to her x-ray results. Advised that a message was sent to her my chart as well with results. Pt states that she has an appt with her pulmonologist in December. Pt verbalized understanding of all information and had no concerns at the end of the call.

## 2021-03-06 NOTE — Telephone Encounter (Signed)
Second attempt to reach the patient.  There was no answer.  Left a message advising the patient to call back or check a MyChart message as I will send a message as well.

## 2021-03-07 DIAGNOSIS — G43B0 Ophthalmoplegic migraine, not intractable: Secondary | ICD-10-CM | POA: Diagnosis not present

## 2021-03-09 ENCOUNTER — Ambulatory Visit (INDEPENDENT_AMBULATORY_CARE_PROVIDER_SITE_OTHER): Payer: Medicare Other

## 2021-03-09 DIAGNOSIS — R350 Frequency of micturition: Secondary | ICD-10-CM

## 2021-03-09 DIAGNOSIS — I2510 Atherosclerotic heart disease of native coronary artery without angina pectoris: Secondary | ICD-10-CM

## 2021-03-09 DIAGNOSIS — Z8673 Personal history of transient ischemic attack (TIA), and cerebral infarction without residual deficits: Secondary | ICD-10-CM

## 2021-03-09 DIAGNOSIS — E1169 Type 2 diabetes mellitus with other specified complication: Secondary | ICD-10-CM

## 2021-03-09 DIAGNOSIS — I10 Essential (primary) hypertension: Secondary | ICD-10-CM

## 2021-03-09 DIAGNOSIS — I480 Paroxysmal atrial fibrillation: Secondary | ICD-10-CM

## 2021-03-09 NOTE — Patient Instructions (Signed)
Visit Information  Thank you for taking time to visit with me today. Please don't hesitate to contact me if I can be of assistance to you before our next scheduled telephone appointment.  Telephone follow up appointment with care management team member scheduled for: 04/11/21 at 11:30 AM  If you need to cancel or re-schedule our visit, please call [phone] and our care guide team will be happy to assist you.  Following is a list of the goals we discussed today:  keep appointment with eye doctor check blood sugar at prescribed times: twice daily and when you have symptoms of low or high blood sugar check feet daily for cuts, sores or redness enter blood sugar readings and medication or insulin into daily log take the blood sugar meter to all doctor visits drink 6 to 8 glasses of water each day manage portion size read food labels for fat, fiber, carbohydrates and portion size keep feet up while sitting wash and dry feet carefully every day - begin a symptom diary - make a plan to eat healthy - keep all lab appointments - take medicine as prescribed - check blood pressure daily - choose a place to take my blood pressure (home, clinic or office, retail store) - write blood pressure results in a log or diary - take blood pressure log to all doctor appointments - call doctor for signs and symptoms of high blood pressure - keep all doctor appointments - eat more whole grains, fruits and vegetables, lean meats and healthy fats - limit salt intake to 2363m/day  Patient verbalizes understanding of instructions provided today and agrees to [place]  MPeter GarterRN, BJackquline Denmark CDE Care Management Coordinator [facility] ([phone] Mobile ([phone]

## 2021-03-09 NOTE — Chronic Care Management (AMB) (Signed)
Chronic Care Management   CCM RN Visit Note  03/09/2021 Name: Breanna Webster MRN: [id number] DOB: [birth year]  Subjective: Breanna Webster is a 74 y.o. year old female who is a primary care patient of [name], NP. The care management team was consulted for assistance with disease management and care coordination needs.    Engaged with patient by telephone for follow up visit in response to provider referral for case management and/or care coordination services.   Consent to Services:  The patient was given information about Chronic Care Management services, agreed to services, and gave verbal consent prior to initiation of services.  Please see initial visit note for detailed documentation.   Patient agreed to services and verbal consent obtained.   Assessment: Review of patient past medical history, allergies, medications, health status, including review of consultants reports, laboratory and other test data, was performed as part of comprehensive evaluation and provision of chronic care management services.   SDOH (Social Determinants of Health) assessments and interventions performed:    CCM Care Plan  Allergies  Allergen Reactions   Statins Other (See Comments)    Muscle aches    Outpatient Encounter Medications as of 03/09/2021  Medication Sig   b complex vitamins capsule Take 1 capsule by mouth daily.   BD INSULIN SYRINGE U/F 31G X 5/16" 1 ML MISC FOUR TIMES A DAY UNDER THE SKIN   BD PEN NEEDLE NANO 2ND GEN 32G X 4 MM MISC    cholecalciferol (VITAMIN D) 25 MCG (1000 UNIT) tablet Take 1,000 Units by mouth daily.   diltiazem (CARDIZEM CD) 120 MG 24 hr capsule TAKE ONE CAPSULE BY MOUTH DAILY   ELIQUIS 5 MG TABS tablet TAKE ONE TABLET BY MOUTH TWICE A DAY   escitalopram (LEXAPRO) 20 MG tablet Take 20 mg by mouth daily.   estradiol (ESTRACE) 0.1 MG/GM vaginal cream Place vaginally.   Lancets (ONETOUCH DELICA PLUS [id number]) MISC USE TO CHECK BLOOD SUGAR TWICE DAILY.Marland Kitchen    levothyroxine (SYNTHROID) 137 MCG tablet Take 137 mcg by mouth once.   losartan (COZAAR) 50 MG tablet Take 50 mg by mouth daily.   metFORMIN (GLUCOPHAGE-XR) 500 MG 24 hr tablet Take 500 mg by mouth 2 (two) times daily.   mirabegron ER (MYRBETRIQ) 50 MG TB24 tablet Take 50 mg by mouth daily.   Multiple Vitamin (MULITIVITAMIN WITH MINERALS) TABS Take 1 tablet by mouth daily.   nitroGLYCERIN (NITROSTAT) 0.4 MG SL tablet Place 1 tablet (0.4 mg total) under the tongue every 5 (five) minutes as needed for chest pain.   NOVOLIN N 100 UNIT/ML injection Inject 28 Units into the skin daily before breakfast.   NOVOLIN R 100 UNIT/ML injection 8 Units 2 (two) times daily before a meal. 10 UNITS THIRTY MINUTES BEFORE MEALS.  5 ADDITIONAL UNITS WITH CARBS OR SNACKS.   omeprazole (PRILOSEC) 20 MG capsule Take 1 capsule (20 mg total) by mouth 2 (two) times daily before a meal.   OneTouch Delica Lancets 14E MISC Tests BS 2 times   ONETOUCH VERIO test strip USE TO MONITOR GLUCOSE LEVELS TWICE DAILY   No facility-administered encounter medications on file as of 03/09/2021.    Patient Active Problem List   Diagnosis Date Noted   COVID-19 02/14/2021   Postviral fatigue syndrome 02/14/2021   Difficulty with adaptive servo-ventilation (ASV) use 02/14/2021   Sepsis secondary to UTI ([place]) 09/08/2020   Elevated ALT measurement 09/08/2020   History of CVA with residual deficit 09/08/2020   Hyperbilirubinemia 09/08/2020  Fatigue associated with anemia 08/03/2020   Cerebrovascular accident (CVA) due to embolism of right posterior cerebral artery ([place]) 08/03/2020   OSA treated with BiPAP 08/03/2020   Complex sleep apnea syndrome 08/03/2020   Treatment-emergent central sleep apnea 08/03/2020   Chronic intermittent hypoxia with obstructive sleep apnea 04/21/2020   OSA (obstructive sleep apnea) 04/21/2020   History of cardioembolic stroke 85/05/7739   Gait disturbance, post-stroke 03/29/2020   Peripheral  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 metabolism ([place]) 03/29/2020   Anxiety    RLQ abdominal pain 10/22/2019   Paroxysmal atrial fibrillation ([place]) 05/22/2019   Iron deficiency anemia 05/07/2019   Atrial fibrillation with RVR ([place]) 10/21/2018   Cerebellar stroke, acute ([place]) 10/21/2018   Streptococcal endocarditis    Endocarditis of mitral valve 10/07/2018   Bacteremia due to Streptococcus Salivarius 10/07/2018   Sepsis ([place]) 28/78/6767   Acute metabolic encephalopathy 20/94/7096   Severe aortic stenosis 07/08/2018   S/P TAVR (transcatheter aortic valve replacement) 07/08/2018   Esophageal thickening    CAD in native artery 04/22/2018   Gastroesophageal reflux disease    Pulmonary hypertension ([place]) 02/21/2018   Essential hypertension 07/15/2017   History of colonic polyps 05/22/2017   Elevated liver function tests 12/05/2016   DOE (dyspnea on exertion) 07/19/2016   Thalassemia minor 05/29/2016   Left bundle branch block 12/06/2015   Upper airway cough syndrome 10/14/2015   Myalgia 02/17/2014   Carotid artery stenosis 06/05/2013   Eustachian tube dysfunction 05/07/2013   Neuropathy of leg 03/07/2012   Anemia, unspecified 01/31/2012   Hot flashes 08/24/2010   Diabetes mellitus due to underlying condition, uncontrolled 06/30/2010   Generalized abdominal pain 03/24/2010   Obesity (BMI 30.0-34.9) 12/21/2009   IBS (irritable bowel syndrome) 04/29/2009   Vitamin D deficiency 03/10/2009   Hypothyroidism 12/13/2008   Dyslipidemia 12/13/2008   Anxiety state 12/13/2008   Other specified disorders of bladder 12/13/2008    Conditions to be addressed/monitored:Atrial Fibrillation, CAD, HTN, DMII, GERD, Overactive Bladder, and hx CVA  Care Plan : RNCM:Diabetes Type 2 (Adult)  Updates made by [name], RN since 03/09/2021 12:00 AM  Completed 03/09/2021   Problem: Lack of Long Term plan for self managment of Type 2 DM Resolved 03/09/2021  Priority: Medium     Long-Range Goal:  Effective long term self management of Type 2 DM Completed 03/09/2021  Start Date: 10/03/2020  Expected End Date: 03/23/2021  Recent Progress: On track  Priority: Medium  Note:   Resolving due to duplicate goal  Objective:  Lab Results  Component Value Date   HGBA1C 7.4 (H) 09/08/2020   Lab Results  Component Value Date   CREATININE 0.67 09/16/2020   CREATININE 0.74 09/11/2020   CREATININE 0.77 09/10/2020   No results found for: EGFR Current Barriers:  Knowledge Deficits related to basic Diabetes pathophysiology and self care/management Knowledge Deficits related to medications used for management of diabetes Unable to independently self manage Type 2 DM Unable to perform IADLs independently-Husband assists with transportation  Pt states that she her blood sugars have been up some this last week with readings up to 200.  States she has not changed her diet but she did think she and her husband had a mild cold/virus this last week  States she is to see Dr. [name] 02/28/21. States her last A1C was 6.9%. Denies any recent hypoglycemia Reports following a low CHO diet.  States her husband has been helping her as needed.  States she did have a fall that injured her  shoulder Case Manager Clinical Goal(s):  patient will demonstrate improved adherence to prescribed treatment plan for diabetes self care/management as evidenced by: daily monitoring and recording of CBG  adherence to ADA/ carb modified diet adherence to prescribed medication regimen contacting provider for new or worsened symptoms or questions Interventions:  Collaboration with Carlisle Cater, Tommi Rumps, NP regarding development and update of comprehensive plan of care as evidenced by provider attestation and co-signature Inter-disciplinary care team collaboration (see longitudinal plan of care) Provided education to patient about basic DM disease process Reviewed medications with patient and discussed importance of medication  adherence Discussed plans with patient for ongoing care management follow up and provided patient with direct contact information for care management team Reinforced s/sx hypoglycemia and hyperglycemia and importance of correct treatment Reviewed scheduled/upcoming provider appointments including: no upcoming primary care provider scheduled, sleep study 02/11/21, GI 03/02/21, Dr. [name] 02/28/21 neurology 05/18/21 Reinforced to check cbg before meals and record, calling endocrinologist or primary care provider   for findings outside established parameters.   Review of patient status, including review of consultants reports, relevant laboratory and other test results, and medications completed. Reviewed importance of increased activity and fall prevention Self-Care Activities - Self administers oral medications as prescribed Self administers insulin as prescribed Attends all scheduled provider appointments Checks blood sugars as prescribed and utilize hyper and hypoglycemia protocol as needed Adheres to prescribed ADA/carb modified Patient Goals: - check blood sugar at prescribed times - check blood sugar if I feel it is too high or too low - enter blood sugar readings and medication or insulin into daily log - take the blood sugar log to all doctor visits - change to whole grain breads, cereal, pasta - drink 6 to 8 glasses of water each day - fill half of plate with vegetables - manage portion size - read food labels for fat, fiber, carbohydrates and portion size - switch to sugar-free drinks - keep appointment with eye doctor - check feet daily for cuts, sores or redness Follow Up Plan: Telephone follow up appointment with care management team member scheduled for: 03/09/21 at 11:30 AM The patient has been provided with contact information for the care management team and has been advised to call with any health related questions or concerns.      Care Plan : RNCM:Urinary Incontinence  (Adult)  Updates made by [name], RN since 03/09/2021 12:00 AM  Completed 03/09/2021   Problem: Symptom Management (Urinary Incontinence) Resolved 03/09/2021  Priority: High     Long-Range Goal: Urinary Incontinence Symptoms Manged Completed 03/09/2021  Start Date: 10/03/2020  Expected End Date: 03/23/2021  Recent Progress: On track  Priority: High  Note:   Resolving due to duplicate goal  Current Barriers:  Ineffective Self Health Maintenance of urinary incontinence with hx of UTI, DM2, CAD, hx of CVA, atrial fib,  anemia Unable to independently self manage urinary incontinence Unable to perform IADLs independently-husband provides transportation to appointments States that she is still having nocturia 1-2 a night and she has urgency if she does not get to bathroom soon enough States she is to saw Dr. [name] on 12/20/20.  States she thinks the Myrbetic he gave her made her nauseated so she is not currently taking  Clinical Goal(s):  Collaboration with Nafziger, Tommi Rumps, NP regarding development and update of comprehensive plan of care as evidenced by provider attestation and co-signature Inter-disciplinary care team collaboration (see longitudinal plan of care) patient will work with care management team to address care coordination  and chronic disease management needs related to Disease Management Educational Needs Care Coordination   Interventions:  Evaluation of current treatment plan related to CAD, HTN, HLD, DMII, Anxiety, and hx CVA, atrial fib , Transportation, ADL IADL limitations, and Inability to perform IADL's independently self-management and patient's adherence to plan as established by provider. Collaboration with [name], NP regarding development and update of comprehensive plan of care as evidenced by provider attestation       and co-signature Inter-disciplinary care team collaboration (see longitudinal plan of care) Discussed plans with patient for  ongoing care management follow up and provided patient with direct contact information for care management team Reinforced on s/sx UTI and when to call MD Reinforced to try doing pelvic floor exercises when she feels urgency and to void every 2 hours during the day Reinforced to keep perineal area clean and dry Reinforced to keep urology appointments with Dr. [name] and encouraged to discuss treatment options for incontinence and overactive bladder Referral to CCM PharmD for medication review, adherence and polypharmacy  Self Care Activities:  Patient verbalizes understanding of plan to self manage urinary incontinence Self administers medications as prescribed Attends all scheduled provider appointments Calls pharmacy for medication refills Calls provider office for new concerns or questions Patient Goals: - clean and dry skin well - keep skin dry - use a fragrance-free lotion on skin - wear a protective pad or garment Follow Up Plan: Telephone follow up appointment with care management team member scheduled for: 03/09/21 at 11:30 AM The patient has been provided with contact information for the care management team and has been advised to call with any health related questions or concerns.      Care Plan : RNCM:Cardiovascular Disease Management(HTN, CAD, Atrial fibrillation)  Updates made by [name], RN since 03/09/2021 12:00 AM  Completed 03/09/2021   Problem: Lack of long term management of Cardiovascular Disease(HTN, CAD, Atrial fibrillation Resolved 03/09/2021  Priority: Medium     Long-Range Goal: Effective Cardiovascular Disease Self  Management(HTN, CAD, Atrial fibrillation) Completed 03/09/2021  Start Date: 10/03/2020  Expected End Date: 03/23/2021  Recent Progress: On track  Priority: Medium  Note:   Resolving due to duplicate goal  Current Barriers:  Knowledge deficits related to self health management of Cardiovascular Disease Management(HTN, CAD, Atrial  fibrillation)  Knowledge Deficits related to Self management of Cardiovascular Disease Management(HTN, CAD, Atrial fibrillation Care Coordination needs related to disease management  in a patient with Self management of Cardiovascular Disease Management(HTN, CAD, Atrial fibrillation Chronic Disease Management support and education needs related to Self management of Cardiovascular Disease Management(HTN, CAD, Atrial fibrillation Unable to independently Self management of Cardiovascular Disease Management(HTN, CAD, Atrial fibrillation Unable to perform IADLs independently States her B/P has been good with readings around 120-130/70-80, States she is having fatigue and is taking naps during the day.  States she has been having issues with her CPAP.  States she is to have a sleep study on 02/09/21.  States she thinks some of her medications are causing her drowsiness and wonders if she needs all of these medications No reports of chest pain or shortness of breath.  Nurse Case Manager Clinical Goal(s):  patient will take all medications exactly as prescribed and will call provider for medication related questions patient will verbalize understanding of Afib Action Plan and when to call doctor patient will verbalize understanding of plan for Self management of Cardiovascular Disease Management(HTN, CAD, Atrial fibrillation patient will meet with RN Care Manager to  address Self management of Cardiovascular Disease Management(HTN, CAD, Atrial fibrillation patient will take all medications exactly as prescribed and will call provider for medication related questions patient will attend all scheduled medical appointments: no upcoming primary care provider scheduled, sleep study 02/11/21, GI 03/02/21, Dr. [name] 02/28/21 neurology 05/18/21 patient will verbalize basic understanding of Self management of Cardiovascular Disease Management(HTN, CAD, Atrial fibrillation disease process and self health management  plan as evidenced by verbalizing understanding of education, voiced adherence to treatment plan the patient will demonstrate ongoing self health care management ability as evidenced by voiced understanding of treatment plan and decrease in hospitalizations * Interventions:  Collaboration with [name], NP regarding development and update of comprehensive plan of care as evidenced by provider attestation and co-signature Inter-disciplinary care team collaboration (see longitudinal plan of care) Basic overview and discussion of HTN, CAD, Atrial fibrillation Medications reviewed Afib action plan reviewed Evaluation of current treatment plan related to Self management of Cardiovascular Disease Management(HTN, CAD, Atrial fibrillation and patient's adherence to plan as established by provider. Reinforced to discuss issues with Dr. Brett Fairy concerning her CPAP Reviewed education  re: HTN, CAD, Atrial fibrillation Reviewed medications with patient and discussed adherence Reinforced to monitor blood pressure daily and record, calling provider for findings outside established parameters.  Discussed plans with patient for ongoing care management follow up and provided patient with direct contact information for care management team Referral to CCM PharmD to review medications and issues with polypharmacy  Self-Care Activities: Self administers medications as prescribed Attends all scheduled provider appointments Calls pharmacy for medication refills Calls provider office for new concerns or questions Patient Goals: - begin a symptom diary - make a plan to eat healthy - keep all lab appointments - take medicine as prescribed - check blood pressure 3 times per week - choose a place to take my blood pressure (home, clinic or office, retail store) - write blood pressure results in a log or diary Follow Up Plan: Telephone follow up appointment with care management team member scheduled for:  03/09/21 at 11:30 AM The patient has been provided with contact information for the care management team and has been advised to call with any health related questions or concerns.      Care Plan : RN Care Manager Plan of Care  Updates made by [name], RN since 03/09/2021 12:00 AM     Problem: Chronic Disease Management and Care Coordination Needs (DM2, CAD, Afib,HTN. Hx CVA )   Priority: High     Long-Range Goal: Establish Plan of Care for Chronic Disease Management Needs (DM2, CAD, Afib,HTN. Hx CVA )   Start Date: 03/09/2021  Expected End Date: 09/05/2021  This Visit's Progress: On track  Priority: High  Note:   Current Barriers:  Care Coordination needs related to Financial constraints related to cost of medications Chronic Disease Management support and education needs related to Atrial Fibrillation, CAD, HTN, DMII, GERD, Overactive Bladder, and hx CVA States that she still has a cough from when she had COVID,  States she saw the GI doctor and he does not think it is from her GERD.  States she has been having a pain in her chest off and on for the last 6 months.  Denies any shortness of breath.  States her CBGs have been ranging from 66-125 with her last low reading when she slept too long at her nap.  States her urinary frequency is now 1-2 times a night.  States her Myrbetriq and Eliquis  are expensive.  RNCM Clinical Goal(s):  Patient will verbalize understanding of plan for management of Atrial Fibrillation, CAD, HTN, DMII, GERD, Overactive Bladder, and hx CVA verbalize basic understanding of  Atrial Fibrillation, CAD, HTN, DMII, GERD, Overactive Bladder, and hx CVA disease process and self health management plan . take all medications exactly as prescribed and will call provider for medication related questions attend all scheduled medical appointments: Echo 03/21/21, Dr. Melvyn Novas 03/30/21, Neurology 05/18/21 demonstrate Improved adherence to prescribed treatment plan for  Atrial Fibrillation, CAD, HTN, DMII, GERD, Overactive Bladder, and hx CVA as evidenced by readings within limits, voiced adherence to plan of care continue to work with RN Care Manager to address care management and care coordination needs related to  Atrial Fibrillation, CAD, HTN, DMII, GERD, Overactive Bladder, and hx CVA work with pharmacist to address Financial constraints related to cost of Myrbetriq and Eliquis related toAtrial Fibrillation, CAD, HTN, DMII, GERD, Overactive Bladder, and hx CVA  through collaboration with RN Care manager, provider, and care team.   Interventions: 1:1 collaboration with primary care provider regarding development and update of comprehensive plan of care as evidenced by provider attestation and co-signature Inter-disciplinary care team collaboration (see longitudinal plan of care) Evaluation of current treatment plan related to  self management and patient's adherence to plan as established by provider  Hypertension Interventions:Goal on track:  Yes Last practice recorded BP readings:  BP Readings from Last 3 Encounters:  03/02/21 118/60  12/28/20 116/74  12/27/20 126/60  Most recent eGFR/CrCl: No results found for: EGFR  No components found for: CRCL  Evaluation of current treatment plan related to hypertension self management and patient's adherence to plan as established by provider; Provided education to patient re: stroke prevention, s/s of heart attack and stroke; Discussed plans with patient for ongoing care management follow up and provided patient with direct contact information for care management team; Advised patient, providing education and rationale, to monitor blood pressure daily and record, calling PCP for findings outside established parameters;  Urinary frequency Goal on track:  Yes Evaluation of current treatment plan related to Overactive Bladder, Financial constraints related to cost of Myrbetric  self-management and patient's adherence  to plan as established by provider. Discussed plans with patient for ongoing care management follow up and provided patient with direct contact information for care management team Evaluation of current treatment plan related to Urinary frequency and patient's adherence to plan as established by provider; Collaborated with CCM PharmD regarding assistance with cost of Myrbetriq; Discussed plans with patient for ongoing care management follow up and provided patient with direct contact information for care management team;  Diabetes Interventions:Goal on track:  Yes Assessed patient's understanding of A1c goal: <7% Provided education to patient about basic DM disease process; Reviewed medications with patient and discussed importance of medication adherence; Counseled on importance of regular laboratory monitoring as prescribed; Provided patient with written educational materials related to hypo and hyperglycemia and importance of correct treatment; Advised patient, providing education and rationale, to check cbg twice a day and record, calling provider for findings outside established parameters; Review of patient status, including review of consultants reports, relevant laboratory and other test results, and medications completed; Lab Results  Component Value Date   HGBA1C 7.4 (H) 09/08/2020  CAD Interventions: Goal on track:  Yes Assessed understanding of CAD diagnosis Medications reviewed including medications utilized in CAD treatment plan Provided education on importance of blood pressure control in management of CAD; Provided education on Importance of limiting  foods high in cholesterol; Reviewed Importance of taking all medications as prescribed Reviewed Importance of attending all scheduled provider appointments Advised patient to discuss cough and chest discomfort with provider;  AFIB Interventions: Goal on track:  Yes   Counseled on increased risk of stroke due to Afib and  benefits of anticoagulation for stroke prevention; Reviewed importance of adherence to anticoagulant exactly as prescribed; Counseled on bleeding risk associated with Eliquis and importance of self-monitoring for signs/symptoms of bleeding; Communicated to CCM PharmD concerns with cost of Eliquis  Patient Goals/Self-Care Activities: Patient will self administer medications as prescribed Patient will attend all scheduled provider appointments Patient will call pharmacy for medication refills Patient will call provider office for new concerns or questions keep appointment with eye doctor check blood sugar at prescribed times: twice daily and when you have symptoms of low or high blood sugar check feet daily for cuts, sores or redness enter blood sugar readings and medication or insulin into daily log take the blood sugar meter to all doctor visits drink 6 to 8 glasses of water each day manage portion size read food labels for fat, fiber, carbohydrates and portion size keep feet up while sitting wash and dry feet carefully every day - begin a symptom diary - make a plan to eat healthy - keep all lab appointments - take medicine as prescribed - check blood pressure daily - choose a place to take my blood pressure (home, clinic or office, retail store) - write blood pressure results in a log or diary - take blood pressure log to all doctor appointments - call doctor for signs and symptoms of high blood pressure - keep all doctor appointments - eat more whole grains, fruits and vegetables, lean meats and healthy fats - limit salt intake to 2390m/day Follow Up Plan:  Telephone follow up appointment with care management team member scheduled for:  04/11/21 The patient has been provided with contact information for the care management team and has been advised to call with any health related questions or concerns.         Plan:Telephone follow up appointment with care management team  member scheduled for:  04/11/21 The patient has been provided with contact information for the care management team and has been advised to call with any health related questions or concerns.  MPeter GarterRN, BJackquline Denmark CDE Care Management Coordinator [facility] [phone] Mobile [phone]

## 2021-03-10 ENCOUNTER — Telehealth: Payer: Self-pay | Admitting: Interventional Cardiology

## 2021-03-10 NOTE — Telephone Encounter (Signed)
Called pt back in regards to CP.  Pt reports chest tightness across chest lasted around 55mn.   Pain occurred X1 a few months ago.  Also reports chest tightness occurred X2 in the past 2 weeks.  Pt reports watching TV with last occurrence denies SOB and dizziness.  Pt did not take NTG d/t side effect of HA.  I advised pt to take tylenol for HA.  Pt does not have a recent BP readings; however, reports BP checked weekly at MD visits and is never high.  Pt has not had chest tightness in a week, I advised pt to take NTG as ordered for chest tightness and to call 911 if no relief.  I placed pt on wait list to be seen by Dr. STamala Julian  I will route message to RN to see if an appointment becomes available.

## 2021-03-10 NOTE — Telephone Encounter (Signed)
Pt c/o of Chest Pain: STAT if CP now or developed within 24 hours  1. Are you having CP right now? No   2. Are you experiencing any other symptoms (ex. SOB, nausea, vomiting, sweating)? no  3. How long have you been experiencing CP? For about a week  4. Is your CP continuous or coming and going? Comes and goes  5. Have you taken Nitroglycerin? No  It's a tightness across her chest. She has an echo scheduled for 11/29, she wants to know if she needs to come in sooner. She had 2 strokes in the past, she wants to make sure nothing wrong is going on.  ?

## 2021-03-13 ENCOUNTER — Other Ambulatory Visit: Payer: Self-pay

## 2021-03-13 ENCOUNTER — Ambulatory Visit (INDEPENDENT_AMBULATORY_CARE_PROVIDER_SITE_OTHER): Payer: Medicare Other | Admitting: Primary Care

## 2021-03-13 ENCOUNTER — Telehealth: Payer: Self-pay | Admitting: Neurology

## 2021-03-13 ENCOUNTER — Encounter: Payer: Self-pay | Admitting: Primary Care

## 2021-03-13 VITALS — BP 112/72 | HR 79 | Temp 98.1°F | Ht 65.0 in | Wt 175.4 lb

## 2021-03-13 DIAGNOSIS — R0989 Other specified symptoms and signs involving the circulatory and respiratory systems: Secondary | ICD-10-CM | POA: Diagnosis not present

## 2021-03-13 DIAGNOSIS — J209 Acute bronchitis, unspecified: Secondary | ICD-10-CM

## 2021-03-13 DIAGNOSIS — I5030 Unspecified diastolic (congestive) heart failure: Secondary | ICD-10-CM | POA: Diagnosis not present

## 2021-03-13 DIAGNOSIS — R269 Unspecified abnormalities of gait and mobility: Secondary | ICD-10-CM | POA: Diagnosis not present

## 2021-03-13 DIAGNOSIS — I69398 Other sequelae of cerebral infarction: Secondary | ICD-10-CM | POA: Diagnosis not present

## 2021-03-13 DIAGNOSIS — R0609 Other forms of dyspnea: Secondary | ICD-10-CM

## 2021-03-13 DIAGNOSIS — I5189 Other ill-defined heart diseases: Secondary | ICD-10-CM | POA: Diagnosis not present

## 2021-03-13 DIAGNOSIS — G4733 Obstructive sleep apnea (adult) (pediatric): Secondary | ICD-10-CM

## 2021-03-13 DIAGNOSIS — I2510 Atherosclerotic heart disease of native coronary artery without angina pectoris: Secondary | ICD-10-CM

## 2021-03-13 LAB — BASIC METABOLIC PANEL
BUN: 19 mg/dL (ref 6–23)
CO2: 26 mEq/L (ref 19–32)
Calcium: 10.7 mg/dL — ABNORMAL HIGH (ref 8.4–10.5)
Chloride: 103 mEq/L (ref 96–112)
Creatinine, Ser: 0.76 mg/dL (ref 0.40–1.20)
GFR: 75.63 mL/min (ref >60.00)
Glucose, Bld: 163 mg/dL — ABNORMAL HIGH (ref 70–99)
Potassium: 4 mEq/L (ref 3.5–5.1)
Sodium: 138 mEq/L (ref 135–145)

## 2021-03-13 LAB — CBC WITH DIFFERENTIAL/PLATELET
Basophils Absolute: 0 10*3/uL (ref 0.0–0.1)
Basophils Relative: 0.6 % (ref 0.0–3.0)
Eosinophils Absolute: 0.2 10*3/uL (ref 0.0–0.7)
Eosinophils Relative: 3 % (ref 0.0–5.0)
HCT: 34.1 % — ABNORMAL LOW (ref 36.0–46.0)
Hemoglobin: 10.5 g/dL — ABNORMAL LOW (ref 12.0–15.0)
Lymphocytes Relative: 17.5 % (ref 12.0–46.0)
Lymphs Abs: 1.4 10*3/uL (ref 0.7–4.0)
MCHC: 30.9 g/dL (ref 30.0–36.0)
MCV: 65.4 fl — ABNORMAL LOW (ref 78.0–100.0)
Monocytes Absolute: 0.8 10*3/uL (ref 0.1–1.0)
Monocytes Relative: 9.7 % (ref 3.0–12.0)
Neutro Abs: 5.5 10*3/uL (ref 1.4–7.7)
Neutrophils Relative %: 69.2 % (ref 43.0–77.0)
Platelets: 192 10*3/uL (ref 150.0–400.0)
RBC: 5.21 Mil/uL — ABNORMAL HIGH (ref 3.87–5.11)
RDW: 16.8 % — ABNORMAL HIGH (ref 11.5–15.5)
WBC: 8 10*3/uL (ref 4.0–10.5)

## 2021-03-13 LAB — BRAIN NATRIURETIC PEPTIDE: Pro B Natriuretic peptide (BNP): 79 pg/mL (ref 0.0–100.0)

## 2021-03-13 MED ORDER — DOXYCYCLINE HYCLATE 100 MG PO TABS: 100.0000 mg | ORAL_TABLET | Freq: Two times a day (BID) | ORAL | 0 refills | Status: DC

## 2021-03-13 MED ORDER — PREDNISONE 10 MG PO TABS: ORAL_TABLET | ORAL | 0 refills | Status: AC

## 2021-03-13 NOTE — Telephone Encounter (Signed)
Pt called, need a sooner appt experiencing some dizziness. Would like a call from the nurse

## 2021-03-13 NOTE — Assessment & Plan Note (Signed)
-   Hx grade 2 diastolic dysfunction. We will check basic labs/BNP d/t vascular congestion seen on CXR. She is scheduled for  Echocardiogram on 03/21/21.

## 2021-03-13 NOTE — Telephone Encounter (Signed)
Pt called back and spoke w/ phone staff. Accepted appt, appt scheduled.

## 2021-03-13 NOTE — Patient Instructions (Addendum)
Below is our plan:  We will continue to monitor symptoms. Please continue close follow up with PCP for stroke prevention. I would like for you to make sure you have seen an ophthalmologist. If not, I recommend a formal evaluation with ophthalmology. Keep an eye on your blood pressure. Please monitor fluid intake.   Please make sure you are staying well hydrated. I recommend 50-60 ounces daily. Well balanced diet and regular exercise encouraged. Consistent sleep schedule with 6-8 hours recommended.   Please continue follow up with care team as directed.   Follow up with Breanna Webster as scheduled in 04/2020 and Breanna Webster as scheduled in 05/2020.    You may receive a survey regarding today's visit. I encourage you to leave honest feed back as I do use this information to improve patient care. Thank you for seeing me today!

## 2021-03-13 NOTE — Telephone Encounter (Signed)
Called back and spoke w/ husband. Pt unable to come to phone d/t being in shower. He reports she has a 33mn episode last night of severe vertigo. Has since resolved, she is feeling fine now. First time this has happened. Also had episode 2-3 wks ago where she lost vision momentarily.  [place] Dr. WMelvyn Novas(pulmonologist). Advised Dr. DBrett Fairydid not have any openings today. Offered appt tomorrow at 11:30a w/ AL,NP. He will have wife call back later today to let [place] if this works. Appt on hold until she calls.

## 2021-03-13 NOTE — Progress Notes (Signed)
Chief Complaint  Patient presents with   Follow-up    RM 2. Last seen 12/28/20. Here to f/u on episode of vertigo/vision loss.     HISTORY OF PRESENT ILLNESS:  03/14/21 ALL:  Breanna Webster is a 75 y.o. female here today for acute follow up for concerns of dizziness. She is followed by Dr Leonie Man and Janett Billow in the stroke clinic. Hx of afib with multiple cardiologic strokes including cerebellar and pontine strokes. She has a history of multiple stroke related symptoms including dizziness, imbalance, left hand tremor and memory impairment. She has reported that dizziness is usually after standing from seated position. She was last seen by Janett Billow 06/2020. She reported improvement in dizziness and imbalance after completion of PT. She was also referred to Dr Brett Fairy for continued EDS. Sleep study 03/30/20 confirmed OSA with AHI 12.8/hr with O2 nadir 76%. Mild PLMD also noted. BiPAP was discussed. Per Dr Dohmeier's note 12/2020, she was doing well but on ASV with max IPAP 18, min IPAP 9 and pressure support of 4. AHI was 1.6. She continues to complain of fatigue and difficulty tolerating ASV. Repeat sleep study 02/09/2021 showed no evidence of sleep apnea and continued PLMD. No afib on EKG. She was advised to discontinue ASV therapy.   She called yesterday reporting a 5-15 minute episode of severe dizziness. Symptoms resolved spontaneously. She reports having a similar event 2-3 weeks ago of sudden dizziness. She reports not being able to stand up due to dizziness. She states that she was dry heaving. She was very nauseous. Symptoms lasted about 5 minutes. She also reports having "black out of right eye vision" that lasts 20 seconds twice over the past 2-3 weeks. Not associated with dizziness or headache. She has had a visual disturbance (husband states she has not mentioned this to him) a couple of times over the past few days where she sees a zig zag line in the right eye. Again, no headache or  dizziness associated with visual disturbance. She had a similar event of right sided vision loss in 03/2020. CTA head and neck unremarkable for acute process. Right paratracheal nodule noted. She has seen optometry but not ophthalmologist. She does not drink much water due to overactive bladder. She drinks coffee in the mornings and usually tea at night.   She continues Eliquis, diltiazem, losartan. BP was 148/71 at beginning of visit, repeated manually 15 minutes later and was 140/60. Orthostatics 125/74 P 77 lying, 119/66 P 78 with standing and patient reported dizziness, 115/62 P 83 standing (less dizzy) and 115/58 P 80 after three minutes standing (less dizzy). She reports allergy to statin therapy. She has follow up echo with cardiology in 2 weeks. She was started on abx and steroids yesterday following visit with pulmonology for productive cough. BP was 112/72 at that visit.    HISTORY (copied from Dr Dohmeier's previous note)  Today, 12-28-2020, [name] returns for stroke follow-up alone CD. The patient unfortunately contracted COVID in mid August 2022 so less than a month ago about 3-1/2 weeks ago.  Since then she has been unable to tolerate her BiPAP therapy but she states it was even before that she started to struggle visit more more.  There were a lot of noises on looking back to her CPAP compliance became spotty at 28 July until then she has used the machine over 80% of the time but she often did not make it to 4 hours at night.  The average time  of use when using machine was 4 hours and just 8 minutes.  She is using a maximum IPAP of 18 minimum EPAP of 9 and a pressure support of 4 cmH2O so this is an ASV machine.  Adaptive servo ventilation with a residual AHI of 1.6/h.  The apnea index is really low while she is using this machine but the air leaks are high her median and leak her 95th percentile air leakage is 20.3 L/min so the noises are probably at least partially related to air  leakage.  So I want her to have a better seal and I want her to return for a attended PSG to see if she still has central sleep apnea.    Reports continued mild left hand tremor with mild left hand weakness -stable without worsening Prior complaints of gait unsteadiness -reports great improvement after working with PT -she has been able to increase ambulation distance - per husband, able to walk 3-4 blocks without stopping where previously difficulty ambulating very short distances She also reports occasional dizziness typically with position changes that last short duration   She does report in December, she presented to the ED with acute onset right eye visual loss which lasted approximately 15 minutes and then completely resolved.  CTA head/neck unremarkable. Reports evaluation by ophthalmology with no concerning findings that may have possibly contributed.  She has not had any additional visual loss or visual symptoms   Reports compliance on Eliquis 5 mg twice daily -denies bleeding or bruising Glucose levels stable with recent A1c 6.5 (04/2020) Routinely follows with cardiology, PCP and endocrinology   Due to concerns of continued excessive daytime fatigue, she was referred to [facility] for possible repeat sleep study as prior sleep study in 08/2018 was negative for sleep apnea.  Evaluated by Dr. Brett Fairy and underwent sleep study on 03/29/2020 which showed AHI 12.8 with REM sleep associated with continuous low oxygen saturation with total O2 desat time 70 minutes with SPO2 nadir 76% and mild PLM.  Underwent CPAP titration 04/21/2020 with apnea unresponsive to hypopneas, apnea and desaturations but OSA responded partially to BiPAP 14/10 with central sleep apnea present with higher BiPAP pressures, sleep-related hypoxemia improved greatly on CPAP and BiPAP, presence of mild PLM confirmed.  Recommended initiating auto BiPAP which she started on 2/9.  She appears to be tolerating without difficulty  but concerned as she continues to feel daytime fatigue.   No further concerns at this time   REVIEW OF SYSTEMS: Out of a complete 14 system review of symptoms, the patient complains only of the following symptoms, intermittent dizziness, imbalance, left hand tremor, memory loss, and all other reviewed systems are negative.   ALLERGIES: Allergies  Allergen Reactions   Statins Other (See Comments)    Muscle aches     HOME MEDICATIONS: Outpatient Medications Prior to Visit  Medication Sig Dispense Refill   b complex vitamins capsule Take 1 capsule by mouth daily.     BD INSULIN SYRINGE U/F 31G X 5/16" 1 ML MISC FOUR TIMES A DAY UNDER THE SKIN 200 each 1   BD PEN NEEDLE NANO 2ND GEN 32G X 4 MM MISC      cholecalciferol (VITAMIN D) 25 MCG (1000 UNIT) tablet Take 1,000 Units by mouth daily.     diltiazem (CARDIZEM CD) 120 MG 24 hr capsule TAKE ONE CAPSULE BY MOUTH DAILY 90 capsule 1   doxycycline (VIBRA-TABS) 100 MG tablet Take 1 tablet (100 mg total) by mouth 2 (two)  times daily. 14 tablet 0   ELIQUIS 5 MG TABS tablet TAKE ONE TABLET BY MOUTH TWICE A DAY 60 tablet 2   escitalopram (LEXAPRO) 20 MG tablet Take 20 mg by mouth daily.     estradiol (ESTRACE) 0.1 MG/GM vaginal cream Place vaginally.     Lancets (ONETOUCH DELICA PLUS [id number]) MISC USE TO CHECK BLOOD SUGAR TWICE DAILY.. 200 each 0   levothyroxine (SYNTHROID) 137 MCG tablet Take 137 mcg by mouth once.     losartan (COZAAR) 50 MG tablet Take 50 mg by mouth daily.     metFORMIN (GLUCOPHAGE-XR) 500 MG 24 hr tablet Take 500 mg by mouth 2 (two) times daily.     mirabegron ER (MYRBETRIQ) 50 MG TB24 tablet Take 50 mg by mouth daily.     Multiple Vitamin (MULITIVITAMIN WITH MINERALS) TABS Take 1 tablet by mouth daily.     nitroGLYCERIN (NITROSTAT) 0.4 MG SL tablet Place 1 tablet (0.4 mg total) under the tongue every 5 (five) minutes as needed for chest pain. 25 tablet 3   NOVOLIN N 100 UNIT/ML injection Inject 28 Units into the skin  daily before breakfast.     NOVOLIN R 100 UNIT/ML injection 8 Units 2 (two) times daily before a meal. 10 UNITS THIRTY MINUTES BEFORE MEALS.  5 ADDITIONAL UNITS WITH CARBS OR SNACKS.     omeprazole (PRILOSEC) 20 MG capsule Take 1 capsule (20 mg total) by mouth 2 (two) times daily before a meal. 60 capsule 3   OneTouch Delica Lancets 81E MISC Tests BS 2 times     ONETOUCH VERIO test strip USE TO MONITOR GLUCOSE LEVELS TWICE DAILY 200 strip 3   predniSONE (DELTASONE) 10 MG tablet Take 1 tablet (10 mg total) by mouth daily with breakfast for 5 days, THEN 0.5 tablets (5 mg total) daily with breakfast for 5 days. 20 tablet 0   No facility-administered medications prior to visit.     PAST MEDICAL HISTORY: Past Medical History:  Diagnosis Date   Anxiety    Arthritis    "back" (04/22/2018)   Back pain    Blood transfusion without reported diagnosis    CAD (coronary artery disease)    a. 03/2018 s/p PCI/DES to the RCA (3.0x15 Onyx DES).   Carotid artery stenosis    Mild   Chest pain    Chronic lower back pain    Cirrhosis (HCC)    Colon polyps    Diverticulitis    Diverticulosis    Esophageal thickening    seen on pre TAVR CT scan, also questionable cirrhosis. MRI recommended. Will refer to GI   Fatty liver    GERD (gastroesophageal reflux disease)    Grave's disease    History of colonic polyps 05/22/2017   History of hiatal hernia    Hypertension    Hypothyroidism    IBS (irritable bowel syndrome)    Osteopenia    Pulmonary nodules    seen on pre TAVR CT. likley benign. no follow up recommended if pt low risk.   S/P TAVR (transcatheter aortic valve replacement)    Severe aortic stenosis    Shortness of breath on exertion    Stroke ([place])    Thalassemia minor    Thyroid disease    Type II diabetes mellitus ([place])      PAST SURGICAL HISTORY: Past Surgical History:  Procedure Laterality Date   65 HOUR [place] STUDY N/A 03/03/2018   Procedure: 24 HOUR [place] STUDY;  Surgeon:  Mauri Pole,  MD;  Location: [facility];  Service: Endoscopy;  Laterality: N/A;   AORTIC VALVE REPLACEMENT  06/2018   COLONOSCOPY     COLONOSCOPY W/ BIOPSIES AND POLYPECTOMY     CORONARY ANGIOGRAPHY Right 04/21/2018   Procedure: CORONARY ANGIOGRAPHY (CATH LAB);  Surgeon: [name], MD;  Location: [facility];  Service: Cardiovascular;  Laterality: Right;   CORONARY STENT INTERVENTION N/A 04/22/2018   Procedure: CORONARY STENT INTERVENTION;  Surgeon: [name], MD;  Location: [facility];  Service: Cardiovascular;  Laterality: N/A;   DILATION AND CURETTAGE OF UTERUS     ESOPHAGEAL MANOMETRY N/A 03/03/2018   Procedure: ESOPHAGEAL MANOMETRY (EM);  Surgeon: Mauri Pole, MD;  Location: [facility];  Service: Endoscopy;  Laterality: N/A;   GASTRIC FUNDOPLICATION     HERNIA REPAIR     HYSTEROSCOPY     fibroids   LAPAROSCOPIC CHOLECYSTECTOMY     LAPAROSCOPY     fibroids   NISSEN FUNDOPLICATION  7412I   POLYPECTOMY     RIGHT/LEFT HEART CATH AND CORONARY ANGIOGRAPHY N/A 02/20/2018   Procedure: RIGHT/LEFT HEART CATH AND CORONARY ANGIOGRAPHY;  Surgeon: [name], MD;  Location: [facility];  Service: Cardiovascular;  Laterality: N/A;   TEE WITHOUT CARDIOVERSION N/A 07/08/2018   Procedure: TRANSESOPHAGEAL ECHOCARDIOGRAM (TEE);  Surgeon: Burnell Blanks, MD;  Location: [facility];  Service: Open Heart Surgery;  Laterality: N/A;   TEE WITHOUT CARDIOVERSION  10/07/2018   TEE WITHOUT CARDIOVERSION N/A 10/07/2018   Procedure: TRANSESOPHAGEAL ECHOCARDIOGRAM (TEE);  Surgeon: Jerline Pain, MD;  Location: [facility];  Service: Cardiovascular;  Laterality: N/A;   TONSILLECTOMY     TRANSCATHETER AORTIC VALVE REPLACEMENT, TRANSFEMORAL N/A 07/08/2018   Procedure: TRANSCATHETER AORTIC VALVE REPLACEMENT, TRANSFEMORAL;  Surgeon: Burnell Blanks, MD;  Location: [facility];  Service: Open Heart Surgery;  Laterality: N/A;     FAMILY HISTORY: Family  History  Adopted: Yes  Problem Relation Age of Onset   Healthy Son        x 2   Headache Other        Cluster headaches   Heart failure Mother    Colon cancer Neg Hx    Pancreatic cancer Neg Hx    Rectal cancer Neg Hx    Stomach cancer Neg Hx      SOCIAL HISTORY: Social History   Socioeconomic History   Marital status: Married    Spouse name: Not on file   Number of children: 2   Years of education: Not on file   Highest education level: Not on file  Occupational History   Occupation: Tree surgeon of the [employer]  Tobacco Use   Smoking status: Never   Smokeless tobacco: Never  Vaping Use   Vaping Use: Never used  Substance and Sexual Activity   Alcohol use: No    Alcohol/week: 0.0 standard drinks   Drug use: Never   Sexual activity: Not Currently  Other Topics Concern   Not on file  Social History Narrative   Lives with husband in a one story home.     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   Education: college.   Social Determinants of Health   Financial Resource Strain: Low Risk    Difficulty of Paying Living Expenses: Not very hard  Food Insecurity: No Food Insecurity   Worried About Running Out of Food in the Last Year: Never true  Ran Out of Food in the Last Year: Never true  Transportation Needs: No Transportation Needs   Lack of Transportation (Medical): No   Lack of Transportation (Non-Medical): No  Physical Activity: Insufficiently Active   Days of Exercise per Week: 2 days   Minutes of Exercise per Session: 50 min  Stress: Stress Concern Present   Feeling of Stress : To some extent  Social Connections: Engineer, building services of Communication with Friends and Family: More than three times a week   Frequency of Social Gatherings with Friends and Family: Once a week   Attends Religious Services: More than 4 times per year   Active Member of [employer] or  Organizations: Yes   Attends Music therapist: More than 4 times per year   Marital Status: Married  Human resources officer Violence: Not At Risk   Fear of Current or Ex-Partner: No   Emotionally Abused: No   Physically Abused: No   Sexually Abused: No     PHYSICAL EXAM  Vitals:   03/14/21 1109 03/14/21 1145  BP: (!) 148/71 140/60  Pulse: 84   SpO2: 98%   Weight: 176 lb 8 oz (80.1 kg)   Height: 5' 5"  (1.651 m)    Body mass index is 29.37 kg/m.  Generalized: Well developed, in no acute distress  Cardiology: normal rate and rhythm, no murmur auscultated  Respiratory: clear to auscultation bilaterally    Neurological examination  Mentation: Alert oriented to time, place, history taking. Follows all commands speech and language fluent Cranial nerve II-XII: Pupils were equal round reactive to light. Extraocular movements were full, visual field were full on confrontational test. Facial sensation and strength were normal. Head turning and shoulder shrug  were normal and symmetric. Motor: The motor testing reveals 5 over 5 strength of all 4 extremities with exception of 4/5 strength of left hip flexion. Good symmetric motor tone is noted throughout.  Sensory: Sensory testing is intact to soft touch on all 4 extremities. No evidence of extinction is noted.  Coordination: Cerebellar testing reveals good finger-nose-finger and heel-to-shin bilaterally.  Gait and station: Stands slowly. Able to push to standing position without assistance. Gait is normal without assistive device. Romberg is negative.  Reflexes: Deep tendon reflexes are symmetric and normal bilaterally.    DIAGNOSTIC DATA (LABS, IMAGING, TESTING) - I reviewed patient records, labs, notes, testing and imaging myself where available.  Lab Results  Component Value Date   WBC 8.0 03/13/2021   HGB 10.5 (L) 03/13/2021   HCT 34.1 (L) 03/13/2021   MCV 65.4 Repeated and verified X2. (L) 03/13/2021   PLT 192.0  03/13/2021      Component Value Date/Time   NA 138 03/13/2021 1154   NA 140 12/05/2020 0000   K 4.0 03/13/2021 1154   CL 103 03/13/2021 1154   CO2 26 03/13/2021 1154   GLUCOSE 163 (H) 03/13/2021 1154   GLUCOSE 135 09/08/2008 0000   BUN 19 03/13/2021 1154   BUN 13 12/05/2020 0000   CREATININE 0.76 03/13/2021 1154   CREATININE 0.83 09/07/2020 0920   CALCIUM 10.7 (H) 03/13/2021 1154   PROT 7.2 12/27/2020 1159   PROT 6.9 02/26/2017 1116   ALBUMIN 4.1 12/27/2020 1159   ALBUMIN 3.9 02/26/2017 1116   AST 28 12/27/2020 1159   AST 23 09/07/2020 0920   ALT 36 (H) 12/27/2020 1159   ALT 54 (H) 09/07/2020 0920   ALKPHOS 72 12/27/2020 1159   BILITOT 0.9 12/27/2020 1159   BILITOT  1.3 (H) 09/07/2020 0920   GFRNONAA 86 12/05/2020 0000   GFRNONAA >60 09/11/2020 0320   GFRNONAA >60 09/07/2020 0920   GFRAA 99 12/05/2020 0000   GFRAA >60 08/14/2019 1025   Lab Results  Component Value Date   CHOL 188 12/05/2020   HDL 55 12/05/2020   LDLCALC 110 12/05/2020   LDLDIRECT 98.0 05/24/2014   TRIG 113 12/05/2020   CHOLHDL 5.3 10/22/2018   Lab Results  Component Value Date   HGBA1C 7.4 (H) 09/08/2020   Lab Results  Component Value Date   VITAMINB12 575 04/30/2019   Lab Results  Component Value Date   TSH 1.44 12/05/2020    MMSE - Mini Mental State Exam 12/21/2019 07/31/2017 07/31/2017  Not completed: - (No Data) (No Data)  Orientation to time 5 - -  Orientation to Place 5 - -  Registration 3 - -  Attention/ Calculation 1 - -  Recall 1 - -  Language- name 2 objects 2 - -  Language- repeat 1 - -  Language- follow 3 step command 3 - -  Language- read & follow direction 1 - -  Write a sentence 1 - -  Copy design 1 - -  Total score 24 - -     No flowsheet data found.   ASSESSMENT AND PLAN  75 y.o. year old female  has a past medical history of Anxiety, Arthritis, Back pain, Blood transfusion without reported diagnosis, CAD (coronary artery disease), Carotid artery stenosis,  Chest pain, Chronic lower back pain, Cirrhosis ([place]), Colon polyps, Diverticulitis, Diverticulosis, Esophageal thickening, Fatty liver, GERD (gastroesophageal reflux disease), Grave's disease, History of colonic polyps (05/22/2017), History of hiatal hernia, Hypertension, Hypothyroidism, IBS (irritable bowel syndrome), Osteopenia, Pulmonary nodules, S/P TAVR (transcatheter aortic valve replacement), Severe aortic stenosis, Shortness of breath on exertion, Stroke ([place]), Thalassemia minor, Thyroid disease, and Type II diabetes mellitus ([place]). here with    Cerebrovascular accident (CVA) due to embolism of right posterior cerebral artery (HCC)  Postviral fatigue syndrome  Paroxysmal atrial fibrillation (HCC)  Monocular vision loss  She has a history of intermittent dizziness, imbalance, memory loss and left hand tremor likely residual stroke deficits. She reports dizziness occurring 03/11/2021 lasted approx 15 minutes and spontaneously resolved. Could be multifactorial. She has had vertigo in the past but feels this episode was different and more severe. She limits water intake due to overactive bladder. Orthostatics are mildly positive with 12 point reduction in diastolic pressure. She reports 2-3 episodes of complete right sided vision loss for about 20 seconds occurring over the past 2-3 weeks. She also reports multiple episodes of visual disturbances described as a zig zag line in right eye that have occurred over the past week. No headache. No stroke like symptoms. CTA head and neck did not show any significant stenosis with last event 03/2020. Symptoms have completely resolved. Could represent occular migraine but she denies pain. Will monitor for now. Not a candidate for triptans. She was started on abx and prednisone for productive cough yesterday. She denies other viral symptoms. Labs were unremarkable with exception of mild anemia. Neuro exam unremarkable today and she is asymptomatic. I do not feel  repeat imaging will change treatment plan at this time. I have encouraged her to see ophthalmology if she hasn't already. She will continue close follow up with cardiology and PCP for stroke prevention. She will continue Eliquis. Reports allergy to statins. Last LDL 110 11/2020. A1C 7.4 08/2020. Goal LDL < 70, A1C <7, BP< 130/80. BP,  lipid and blood sugar control discussed. S/S of stroke reviewed and she was advised to seek emergency medical attention by calling 911 for any concerns of stroke symptoms. She has discontinued ASV based on last PSG. She will follow up with Janett Billow and Dr Brett Fairy as directed.    No orders of the defined types were placed in this encounter.    No orders of the defined types were placed in this encounter.     Debbora Presto, MSN, FNP-C 03/14/2021, 12:48 PM  [facility] [address], [place], [place] [address] [phone]

## 2021-03-13 NOTE — Assessment & Plan Note (Addendum)
-   Seen today for acute bronchitis symptoms. Cough started 1 month ago which is productive with purulent mucus. She is unable to take deep breath without coughing. CXR on 03/02/21 showed mild bilateral interstitial thickening. Sending in RX doxycycline 1 tab twicec daily x 7 days and low dose prednisone (61m x 5 days; 532mx 5 days). Advised she start taking Mucinex [id number] daily along with delsym cough syrup until symptom resolve. If no improvement may want to consider getting CT chest and/or sputum cultures.

## 2021-03-13 NOTE — Progress Notes (Signed)
Please let patient know labs showed mild anemia, improved from several months ago. Does she follow with PCP or hematology for hx anemia??? WBC, kidney function and BNP were normal

## 2021-03-13 NOTE — Progress Notes (Signed)
@Patient  ID: Breanna Webster, female    DOB: 03/21/1946, 75 y.o.   MRN: [id number]  Chief Complaint  Patient presents with   Follow-up    Referring provider: [name], NP  HPI: 75 year old female, never smoked. PMH significant upper airway cough syndrome, HTN, pulmonary hypertension, CAD, CVA, paroxysmal afib, complex sleep apnea, OSA no longer on BIPAP, GERD, diabetes, hypothyroidism. Patient of Dr. Melvyn Novas, last seen on 05/26/19.  03/13/2021- Interim hx  Patient presents today for acute visit/cough. Cough started one 1 months ago. She is complaint with prilosec twice daily. She has no complaints of shortness of breath but does reports inability to take a full deep breath without cough. Her cough is productive cough with purulent green mucus. She has not tried taking anything over the cough. CXR showed mild bilateral interstitial thickening. Stable cardiomegaly with mild pulmonary vascular congestion. She will be having an echocardiogram in two weeks. Denies fever, chest tightness, wheezing  She tells me that she had stroke 2 years ago. She has residual tremor and double vision. She is following with ophthalmology and will be seeing neurology hopefully tomorrow.    Allergies  Allergen Reactions   Statins Other (See Comments)    Muscle aches    Immunization History  Administered Date(s) Administered   Fluad Quad(high Dose 65+) 01/20/2019, 01/06/2020   Hep A / Hep B 06/15/2015, 07/15/2015, 12/27/2015   Influenza Split 01/16/2012   Influenza Whole 01/31/2010   Influenza, High Dose Seasonal PF 01/28/2014, 12/29/2014, 02/17/2016, 01/21/2018   Influenza-Unspecified 12/29/2012   PFIZER(Purple Top)SARS-COV-2 Vaccination 05/16/2019, 06/06/2019, 02/06/2020   Pneumococcal Conjugate-13 06/14/2017   Pneumococcal Polysaccharide-23 01/28/2012, 01/28/2014   Pneumococcal-Unspecified 04/24/2015   Tdap 12/29/2014   Zoster Recombinat (Shingrix) 03/13/2017, 05/24/2017   Zoster, Live 02/14/2012     Past Medical History:  Diagnosis Date   Anxiety    Arthritis    "back" (04/22/2018)   Back pain    Blood transfusion without reported diagnosis    CAD (coronary artery disease)    a. 03/2018 s/p PCI/DES to the RCA (3.0x15 Onyx DES).   Carotid artery stenosis    Mild   Chest pain    Chronic lower back pain    Cirrhosis (HCC)    Colon polyps    Diverticulitis    Diverticulosis    Esophageal thickening    seen on pre TAVR CT scan, also questionable cirrhosis. MRI recommended. Will refer to GI   Fatty liver    GERD (gastroesophageal reflux disease)    Grave's disease    History of colonic polyps 05/22/2017   History of hiatal hernia    Hypertension    Hypothyroidism    IBS (irritable bowel syndrome)    Osteopenia    Pulmonary nodules    seen on pre TAVR CT. likley benign. no follow up recommended if pt low risk.   S/P TAVR (transcatheter aortic valve replacement)    Severe aortic stenosis    Shortness of breath on exertion    Stroke (HCC)    Thalassemia minor    Thyroid disease    Type II diabetes mellitus (HCC)     Tobacco History: Social History   Tobacco Use  Smoking Status Never  Smokeless Tobacco Never   Counseling given: Not Answered   Outpatient Medications Prior to Visit  Medication Sig Dispense Refill   b complex vitamins capsule Take 1 capsule by mouth daily.     BD INSULIN SYRINGE U/F 31G X 5/16" 1 ML MISC  FOUR TIMES A DAY UNDER THE SKIN 200 each 1   BD PEN NEEDLE NANO 2ND GEN 32G X 4 MM MISC      cholecalciferol (VITAMIN D) 25 MCG (1000 UNIT) tablet Take 1,000 Units by mouth daily.     diltiazem (CARDIZEM CD) 120 MG 24 hr capsule TAKE ONE CAPSULE BY MOUTH DAILY 90 capsule 1   ELIQUIS 5 MG TABS tablet TAKE ONE TABLET BY MOUTH TWICE A DAY 60 tablet 2   escitalopram (LEXAPRO) 20 MG tablet Take 20 mg by mouth daily.     estradiol (ESTRACE) 0.1 MG/GM vaginal cream Place vaginally.     Lancets (ONETOUCH DELICA PLUS [id number]) MISC USE TO CHECK BLOOD  SUGAR TWICE DAILY.. 200 each 0   levothyroxine (SYNTHROID) 137 MCG tablet Take 137 mcg by mouth once.     losartan (COZAAR) 50 MG tablet Take 50 mg by mouth daily.     metFORMIN (GLUCOPHAGE-XR) 500 MG 24 hr tablet Take 500 mg by mouth 2 (two) times daily.     mirabegron ER (MYRBETRIQ) 50 MG TB24 tablet Take 50 mg by mouth daily.     Multiple Vitamin (MULITIVITAMIN WITH MINERALS) TABS Take 1 tablet by mouth daily.     NOVOLIN N 100 UNIT/ML injection Inject 28 Units into the skin daily before breakfast.     NOVOLIN R 100 UNIT/ML injection 8 Units 2 (two) times daily before a meal. 10 UNITS THIRTY MINUTES BEFORE MEALS.  5 ADDITIONAL UNITS WITH CARBS OR SNACKS.     omeprazole (PRILOSEC) 20 MG capsule Take 1 capsule (20 mg total) by mouth 2 (two) times daily before a meal. 60 capsule 3   OneTouch Delica Lancets 66Q MISC Tests BS 2 times     ONETOUCH VERIO test strip USE TO MONITOR GLUCOSE LEVELS TWICE DAILY 200 strip 3   nitroGLYCERIN (NITROSTAT) 0.4 MG SL tablet Place 1 tablet (0.4 mg total) under the tongue every 5 (five) minutes as needed for chest pain. (Patient not taking: Reported on 03/13/2021) 25 tablet 3   No facility-administered medications prior to visit.    Review of Systems  Review of Systems  Constitutional: Negative.   HENT: Negative.    Respiratory:  Positive for cough. Negative for chest tightness, shortness of breath and wheezing.   Neurological:  Positive for tremors.    Physical Exam  BP 112/72 (BP Location: Right Arm, Patient Position: Sitting, Cuff Size: Normal)   Pulse 79   Temp 98.1 F (36.7 C) (Oral)   Ht 5' 5"  (1.651 m)   Wt 175 lb 6.4 oz (79.6 kg)   SpO2 97%   BMI 29.19 kg/m  Physical Exam Constitutional:      Appearance: Normal appearance.  HENT:     Head: Normocephalic and atraumatic.     Mouth/Throat:     Mouth: Mucous membranes are moist.     Pharynx: Oropharynx is clear.  Cardiovascular:     Rate and Rhythm: Normal rate and regular rhythm.   Pulmonary:     Effort: Pulmonary effort is normal.     Breath sounds: Normal breath sounds. No wheezing or rhonchi.     Comments: Faint rales bilateral bases Musculoskeletal:        General: Normal range of motion.  Skin:    General: Skin is warm and dry.  Neurological:     General: No focal deficit present.     Mental Status: She is alert and oriented to person, place, and time. Mental status is at  baseline.  Psychiatric:        Mood and Affect: Mood normal.        Behavior: Behavior normal.        Thought Content: Thought content normal.        Judgment: Judgment normal.     Lab Results:  CBC    Component Value Date/Time   WBC 8.5 09/16/2020 1443   RBC 4.75 09/16/2020 1443   HGB 9.6 (L) 09/16/2020 1443   HGB 11.1 08/03/2020 1100   HCT 29.9 (L) 09/16/2020 1443   HCT 34.5 08/03/2020 1100   PLT 276.0 09/16/2020 1443   PLT 201 08/03/2020 1100   MCV 63.0 Repeated and verified X2. (L) 09/16/2020 1443   MCV 65 (L) 08/03/2020 1100   MCH 19.9 (L) 09/12/2020 0722   MCHC 32.2 09/16/2020 1443   RDW 14.7 09/16/2020 1443   RDW 15.6 (H) 08/03/2020 1100   LYMPHSABS 1.7 09/16/2020 1443   LYMPHSABS 1.4 08/03/2020 1100   MONOABS 0.5 09/16/2020 1443   EOSABS 0.1 09/16/2020 1443   EOSABS 0.1 08/03/2020 1100   BASOSABS 0.1 09/16/2020 1443   BASOSABS 0.1 08/03/2020 1100    BMET    Component Value Date/Time   NA 140 12/05/2020 0000   K 4.1 12/05/2020 0000   CL 107 12/05/2020 0000   CO2 28 (A) 12/05/2020 0000   GLUCOSE 120 (H) 09/16/2020 1443   GLUCOSE 135 09/08/2008 0000   BUN 13 12/05/2020 0000   CREATININE 0.7 12/05/2020 0000   CREATININE 0.67 09/16/2020 1443   CREATININE 0.83 09/07/2020 0920   CALCIUM 10.4 12/05/2020 0000   GFRNONAA 86 12/05/2020 0000   GFRNONAA >60 09/11/2020 0320   GFRNONAA >60 09/07/2020 0920   GFRAA 99 12/05/2020 0000   GFRAA >60 08/14/2019 1025    BNP    Component Value Date/Time   BNP 112.2 (H) 07/07/2018 1630   BNP 77.1 12/06/2015 0939     ProBNP    Component Value Date/Time   PROBNP 270 02/19/2018 1233   PROBNP 133.0 (H) 07/19/2016 1453    Imaging: DG Chest 2 View  Result Date: 03/03/2021 CLINICAL DATA:  Cough, congestion and shortness of breath for 2 weeks. History of hypertension. EXAM: CHEST - 2 VIEW COMPARISON:  X-ray chest 09/08/2020 FINDINGS: Mild bilateral interstitial thickening. No focal consolidation. No pleural effusion or pneumothorax. Stable cardiomegaly. Prior TAVR. No acute osseous abnormality. IMPRESSION: Cardiomegaly with mild pulmonary vascular congestion. Electronically Signed   By: Kathreen Devoid M.D.   On: 03/03/2021 08:31   MR LUMBAR SPINE WO CONTRAST  Result Date: 02/20/2021 CLINICAL DATA:  Patient complains of lower left side back and buttock pain for 5 years. EXAM: MRI LUMBAR SPINE WITHOUT CONTRAST TECHNIQUE: Multiplanar, multisequence MR imaging of the lumbar spine was performed. No intravenous contrast was administered. COMPARISON:  MR lumbar 12/28/2015. FINDINGS: Segmentation:  Standard. Alignment: Mild lumbar levoscoliosis. Unchanged trace anterolisthesis of L4 on L5. Vertebrae: No fracture or suspicious marrow lesion. Progressive Modic type 2 endplate changes at K4-8. Conus medullaris and cauda equina: Conus extends to the L1-2 level. Conus and cauda equina appear normal. Paraspinal and other soft tissues: Partially visualized bilateral renal cysts. Disc levels: Disc desiccation from L3-4 to L5-S1 with chronic, asymmetrically severe right-sided disc space narrowing at L3-4 and mild narrowing at L4-5 and L5-S1. T12-L1: Negative. L1-2: Minimal disc bulging and a tiny central disc protrusion without stenosis, unchanged. L2-3: Negative. L3-4: Disc bulging, endplate spurring, and mild facet hypertrophy result in mild right neural foraminal stenosis  without spinal stenosis, unchanged. L4-5: Left eccentric disc bulging and moderate facet hypertrophy result in borderline to mild left neural foraminal stenosis  without spinal stenosis, unchanged. L5-S1: Disc bulging, a left foraminal disc extrusion, and moderate facet hypertrophy result in mild left lateral recess stenosis and moderate to severe left neural foraminal stenosis with potential left L5 nerve root impingement. The disc herniation has enlarged from the prior MRI with increased foraminal stenosis. No spinal stenosis. IMPRESSION: 1. Enlargement of a left foraminal disc herniation at L5-S1 with moderate to severe left neural foraminal stenosis and potential left L5 nerve root impingement. 2. Unchanged disc and facet degeneration elsewhere as above. Electronically Signed   By: Logan Bores M.D.   On: 02/20/2021 11:37     Assessment & Plan:   Acute bronchitis - Seen today for acute bronchitis symptoms. Cough started 1 month ago which is productive with purulent mucus. She is unable to take deep breath without coughing. CXR on 03/02/21 showed mild bilateral interstitial thickening. Sending in RX doxycycline 1 tab twicec daily x 7 days and low dose prednisone (8m x 5 days; 522mx 5 days). Advised she start taking Mucinex 60093mwice daily along with delsym cough syrup until symptom resolve. If no improvement may want to consider getting CT chest and/or sputum cultures.   Diastolic heart failure (HCC) - Hx grade 2 diastolic dysfunction. We will check basic labs/BNP d/t vascular congestion seen on CXR. She is scheduled for  Echocardiogram on 03/21/21.   Gait disturbance, post-stroke - Patient complaints of tremor, double vision, vertigo and balance issues since having a stroke several years ago. She will be seeing neurology tomorrow.  OSA treated with BiPAP - No longer on BIPAP therapy  - NPSG 02/09/21 showed no evidence of OSA; Total APNEA/HYPOPNEA INDEX (AHI) was 0.4/hour    EliMartyn EhrichP 03/13/2021

## 2021-03-13 NOTE — Assessment & Plan Note (Addendum)
-   Patient complaints of tremor, double vision, vertigo and balance issues since having a stroke several years ago. She will be seeing neurology tomorrow.

## 2021-03-13 NOTE — Patient Instructions (Addendum)
Seen today for acute bronchitis symptoms. CXR showed mild bilateral interstitial thickening which can be seen with this. Sending in RX for antibiotics and low dose prednisone d.t productive/purulent cough. We will also check labs d/t vascular congestion seen on CXR which can be from heart failure. You will be getting echocardiogram which will also look at this further. Follow up with neurology d.t vision and balance issues.   Recommendations: - Doxycycline 1 tablet twice daily x 7 days - Prednisone 48m x 5 days; then 54mx 5 days; stop  - Start Mucinex 60069mwice a day for 7-10 days or until cough improved (over the counter) - Start Delsym cough syrup twice a day for 7-10 days or until cough improved (over the counter)  Orders: - Labs today (CBC, BMET and BNP)  Follow-up: - 3 months with Dr. WerMelvyn Novas Prednisone Tablets What is this medication? PREDNISONE (PRED ni sone) treats many conditions such as asthma, allergic reactions, arthritis, inflammatory bowel diseases, adrenal, and blood or bone marrow disorders. It works by decreasing inflammation, slowing down an overactive immune system, or replacing cortisol normally made in the body. Cortisol is a hormone that plays an important role in how the body responds to stress, illness, and injury. It belongs to a group of medications called steroids. This medicine may be used for other purposes; ask your health care provider or pharmacist if you have questions. COMMON BRAND NAME(S): Deltasone, Predone, Sterapred, Sterapred DS What should I tell my care team before I take this medication? They need to know if you have any of these conditions: Cushing's syndrome Diabetes Glaucoma Heart disease High blood pressure Infection (especially a virus infection such as chickenpox, cold sores, or herpes) Kidney disease Liver disease Mental illness Myasthenia gravis Osteoporosis Seizures Stomach or intestine problems Thyroid disease An unusual or  allergic reaction to lactose, prednisone, other medications, foods, dyes, or preservatives Pregnant or trying to get pregnant Breast-feeding How should I use this medication? Take this medication by mouth with a glass of water. Follow the directions on the prescription label. Take this medication with food. If you are taking this medication once a day, take it in the morning. Do not take more medication than you are told to take. Do not suddenly stop taking your medication because you may develop a severe reaction. Your care team will tell you how much medication to take. If your care team wants you to stop the medication, the dose may be slowly lowered over time to avoid any side effects. Talk to your care team about the use of this medication in children. Special care may be needed. Overdosage: If you think you have taken too much of this medicine contact a poison control center or emergency room at once. NOTE: This medicine is only for you. Do not share this medicine with others. What if I miss a dose? If you miss a dose, take it as soon as you can. If it is almost time for your next dose, talk to your care team. You may need to miss a dose or take an extra dose. Do not take double or extra doses without advice. What may interact with this medication? Do not take this medication with any of the following: Metyrapone Mifepristone This medication may also interact with the following: Aminoglutethimide Amphotericin B Aspirin and aspirin-like medications Barbiturates Certain medications for diabetes, like glipizide or glyburide Cholestyramine Cholinesterase inhibitors Cyclosporine Digoxin Diuretics Ephedrine Female hormones, like estrogens and birth control pills Isoniazid Ketoconazole  NSAIDS, medications for pain and inflammation, like ibuprofen or naproxen Phenytoin Rifampin Toxoids Vaccines Warfarin This list may not describe all possible interactions. Give your health care  provider a list of all the medicines, herbs, non-prescription drugs, or dietary supplements you use. Also tell them if you smoke, drink alcohol, or use illegal drugs. Some items may interact with your medicine. What should I watch for while using this medication? Visit your care team for regular checks on your progress. If you are taking this medication over a prolonged period, carry an identification card with your name and address, the type and dose of your medication, and your care team's name and address. This medication may increase your risk of getting an infection. Tell your care team if you are around anyone with measles or chickenpox, or if you develop sores or blisters that do not heal properly. If you are going to have surgery, tell your care team that you have taken this medication within the last twelve months. Ask your care team about your diet. You may need to lower the amount of salt you eat. This medication may increase blood sugar. Ask your care team if changes in diet or medications are needed if you have diabetes. What side effects may I notice from receiving this medication? Side effects that you should report to your care team as soon as possible: Allergic reactions--skin rash, itching, hives, swelling of the face, lips, tongue, or throat Cushing syndrome--increased fat around the midsection, upper back, neck, or face, pink or purple stretch marks on the skin, thinning, fragile skin that easily bruises, unexpected hair growth High blood sugar (hyperglycemia)--increased thirst or amount of urine, unusual weakness or fatigue, blurry vision Increase in blood pressure Infection--fever, chills, cough, sore throat, wounds that don't heal, pain or trouble when passing urine, general feeling of discomfort or being unwell Low adrenal gland function--nausea, vomiting, loss of appetite, unusual weakness or fatigue, dizziness Mood and behavior changes--anxiety, nervousness, confusion,  hallucinations, irritability, hostility, thoughts of suicide or self-harm, worsening mood, feelings of depression Stomach bleeding--bloody or black, tar-like stools, vomiting blood or brown material that looks like coffee grounds Swelling of the ankles, hands, or feet Side effects that usually do not require medical attention (report to your care team if they continue or are bothersome): Acne General discomfort and fatigue Headache Increase in appetite Nausea Trouble sleeping Weight gain This list may not describe all possible side effects. Call your doctor for medical advice about side effects. You may report side effects to FDA at 1-800-FDA-1088. Where should I keep my medication? Keep out of the reach of children. Store at room temperature between 15 and 30 degrees C (59 and 86 degrees F). Protect from light. Keep container tightly closed. Throw away any unused medication after the expiration date. NOTE: This sheet is a summary. It may not cover all possible information. If you have questions about this medicine, talk to your doctor, pharmacist, or health care provider.  2022 Elsevier/Gold Standard (2020-07-08 00:00:00)   Doxycycline Capsules or Tablets What is this medication? DOXYCYCLINE (dox i SYE kleen) treats infections caused by bacteria. It belongs to a group of medications called tetracycline antibiotics. It will not treat colds, the flu, or infections caused by viruses. This medicine may be used for other purposes; ask your health care provider or pharmacist if you have questions. COMMON BRAND NAME(S): Acticlate, Adoxa, Adoxa CK, Adoxa Pak, Adoxa TT, Alodox, Avidoxy, Doxal, LYMEPAK, Mondoxyne NL, Monodox, Morgidox 1x, Morgidox 1x Kit, Morgidox 2x, Morgidox  2x Kit, NutriDox, Ocudox, Okebo, Periostat, TARGADOX, Vibra-Tabs, Vibramycin What should I tell my care team before I take this medication? They need to know if you have any of these conditions: Kidney disease Liver  disease Long exposure to sunlight like working outdoors Recent stomach surgery Stomach or intestine problems such as colitis Vision Problems Yeast or fungal infection of the mouth or vagina An unusual or allergic reaction to doxycycline, tetracycline antibiotics, other medications, foods, dyes, or preservatives Pregnant or trying to get pregnant Breast-feeding How should I use this medication? Take this medication by mouth with water. Take it as directed on the prescription label at the same time every day. It is best to take this medication without food, but if it upsets your stomach take it with food. Take all of this medication unless your care team tells you to stop it early. Keep taking it even if you think you are better. Take antacids and products with aluminum, calcium, magnesium, iron, and zinc in them at a different time of day than this medication. Talk to your care team if you have questions. Talk to your care team regarding the use of this medication in children. While this medication may be prescribed for selected conditions, precautions do apply. Overdosage: If you think you have taken too much of this medicine contact a poison control center or emergency room at once. NOTE: This medicine is only for you. Do not share this medicine with others. What if I miss a dose? If you miss a dose, take it as soon as you can. If it is almost time for your next dose, take only that dose. Do not take double or extra doses. What may interact with this medication? Antacids, vitamins, or other products that contain aluminum, calcium, iron, magnesium, or zinc Barbiturates Birth control pills Bismuth subsalicylate Carbamazepine Methoxyflurane Oral retinoids such as acitretin, isotretinoin Other antibiotics Phenytoin Warfarin This list may not describe all possible interactions. Give your health care provider a list of all the medicines, herbs, non-prescription drugs, or dietary supplements  you use. Also tell them if you smoke, drink alcohol, or use illegal drugs. Some items may interact with your medicine. What should I watch for while using this medication? Tell your care team if your symptoms do not improve. Do not treat diarrhea with over the counter products. Contact your care team if you have diarrhea that lasts more than 2 days or if it is severe and watery. Do not take this medication just before going to bed. It may not dissolve properly when you lay down and can cause pain in your throat. Drink plenty of fluids while taking this medication to also help reduce irritation in your throat. This medication can make you more sensitive to the sun. Keep out of the sun. If you cannot avoid being in the sun, wear protective clothing and use sunscreen. Do not use sun lamps or tanning beds/booths. Birth control pills may not work properly while you are taking this medication. Talk to your care team about using an extra method of birth control. If you are being treated for a sexually transmitted infection, avoid sexual contact until you have finished your treatment. Your sexual partner may also need treatment. If you are using this medication to prevent malaria, you should still protect yourself from contact with mosquitos. Stay in screened-in areas, use mosquito nets, keep your body covered, and use an insect repellent. What side effects may I notice from receiving this medication? Side effects that you  should report to your care team as soon as possible: Allergic reactions--skin rash, itching, hives, swelling of the face, lips, tongue, or throat Increased pressure around the brain--severe headache, change in vision, blurry vision, nausea, vomiting Joint pain Pain or trouble swallowing Redness, blistering, peeling, or loosening of the skin, including inside the mouth Severe diarrhea, fever Unusual vaginal discharge, itching, or odor Side effects that usually do not require medical  attention (report these to your care team if they continue or are bothersome): Change in tooth color Diarrhea Headache Heartburn Nausea This list may not describe all possible side effects. Call your doctor for medical advice about side effects. You may report side effects to FDA at 1-800-FDA-1088. Where should I keep my medication? Keep out of the reach of children and pets. Store at room temperature, below 30 degrees C (86 degrees F). Protect from light. Keep container tightly closed. Throw away any unused medication after the expiration date. Taking this medication after the expiration date can make you seriously ill. NOTE: This sheet is a summary. It may not cover all possible information. If you have questions about this medicine, talk to your doctor, pharmacist, or health care provider.  2022 Elsevier/Gold Standard (2020-06-25 00:00:00)

## 2021-03-13 NOTE — Assessment & Plan Note (Addendum)
-   No longer on BIPAP therapy  - NPSG 02/09/21 showed no evidence of OSA; Total APNEA/HYPOPNEA INDEX (AHI) was 0.4/hour

## 2021-03-14 ENCOUNTER — Encounter: Payer: Self-pay | Admitting: Hematology

## 2021-03-14 ENCOUNTER — Encounter: Payer: Self-pay | Admitting: Family Medicine

## 2021-03-14 ENCOUNTER — Ambulatory Visit (INDEPENDENT_AMBULATORY_CARE_PROVIDER_SITE_OTHER): Payer: Medicare Other | Admitting: Family Medicine

## 2021-03-14 VITALS — BP 140/60 | HR 84 | Ht 65.0 in | Wt 176.5 lb

## 2021-03-14 DIAGNOSIS — H5460 Unqualified visual loss, one eye, unspecified: Secondary | ICD-10-CM

## 2021-03-14 DIAGNOSIS — I63431 Cerebral infarction due to embolism of right posterior cerebral artery: Secondary | ICD-10-CM

## 2021-03-14 DIAGNOSIS — I480 Paroxysmal atrial fibrillation: Secondary | ICD-10-CM

## 2021-03-14 DIAGNOSIS — G9331 Postviral fatigue syndrome: Secondary | ICD-10-CM | POA: Diagnosis not present

## 2021-03-15 ENCOUNTER — Ambulatory Visit: Payer: Medicare Other

## 2021-03-15 NOTE — Telephone Encounter (Signed)
Clinical observation. Let us know if NTG use.  ----- Message -----  From: Loren Racer, RN  Sent: 03/13/2021   7:53 AM EST  To: [name], MD, Loren Racer, RN    Left message to call back

## 2021-03-19 ENCOUNTER — Other Ambulatory Visit: Payer: Self-pay | Admitting: Adult Health

## 2021-03-19 DIAGNOSIS — Z760 Encounter for issue of repeat prescription: Secondary | ICD-10-CM

## 2021-03-20 ENCOUNTER — Telehealth: Payer: Self-pay | Admitting: Pharmacist

## 2021-03-20 NOTE — Chronic Care Management (AMB) (Signed)
Chronic Care Management Pharmacy Assistant   Name: Breanna Webster  MRN: [id number] DOB: 08-Aug-1945  Reason for Encounter: Disease State Hypertension Assessment Call    Conditions to be addressed/monitored: HTN  Recent office visits:  03/09/21 [name] RN - Patient presented for CCM Nurse visit via phone  Recent consult visits:  03/14/21 Debbora Presto, NP - Patient presented for CVA and other concerns. No medication changes.   03/13/21 Martyn Ehrich, NP - Patient presented for Pulmonary vascular congestion and other concerns. Prescribed Doxycyline Hyclate 100 mg and Prednisone 10 mg.   03/02/21 [name], [name], MD - Patient presented for Gastroesophageal reflux disease and other concerns. No medication changes.   Hospital visits:  None in previous 6 months  Medications: Outpatient Encounter Medications as of 03/20/2021  Medication Sig   b complex vitamins capsule Take 1 capsule by mouth daily.   BD INSULIN SYRINGE U/F 31G X 5/16" 1 ML MISC FOUR TIMES A DAY UNDER THE SKIN   BD PEN NEEDLE NANO 2ND GEN 32G X 4 MM MISC    cholecalciferol (VITAMIN D) 25 MCG (1000 UNIT) tablet Take 1,000 Units by mouth daily.   diltiazem (CARDIZEM CD) 120 MG 24 hr capsule TAKE ONE CAPSULE BY MOUTH DAILY   doxycycline (VIBRA-TABS) 100 MG tablet Take 1 tablet (100 mg total) by mouth 2 (two) times daily.   ELIQUIS 5 MG TABS tablet TAKE ONE TABLET BY MOUTH TWICE A DAY   escitalopram (LEXAPRO) 20 MG tablet Take 20 mg by mouth daily.   estradiol (ESTRACE) 0.1 MG/GM vaginal cream Place vaginally.   Lancets (ONETOUCH DELICA PLUS [id number]) MISC USE TO CHECK BLOOD SUGAR TWICE DAILY.Marland Kitchen   levothyroxine (SYNTHROID) 137 MCG tablet Take 137 mcg by mouth once.   losartan (COZAAR) 50 MG tablet Take 50 mg by mouth daily.   metFORMIN (GLUCOPHAGE-XR) 500 MG 24 hr tablet Take 500 mg by mouth 2 (two) times daily.   mirabegron ER (MYRBETRIQ) 50 MG TB24 tablet Take 50 mg by mouth daily.   Multiple  Vitamin (MULITIVITAMIN WITH MINERALS) TABS Take 1 tablet by mouth daily.   nitroGLYCERIN (NITROSTAT) 0.4 MG SL tablet Place 1 tablet (0.4 mg total) under the tongue every 5 (five) minutes as needed for chest pain.   NOVOLIN N 100 UNIT/ML injection Inject 28 Units into the skin daily before breakfast.   NOVOLIN R 100 UNIT/ML injection 8 Units 2 (two) times daily before a meal. 10 UNITS THIRTY MINUTES BEFORE MEALS.  5 ADDITIONAL UNITS WITH CARBS OR SNACKS.   omeprazole (PRILOSEC) 20 MG capsule Take 1 capsule (20 mg total) by mouth 2 (two) times daily before a meal.   OneTouch Delica Lancets 93O MISC Tests BS 2 times   ONETOUCH VERIO test strip USE TO MONITOR GLUCOSE LEVELS TWICE DAILY   predniSONE (DELTASONE) 10 MG tablet Take 1 tablet (10 mg total) by mouth daily with breakfast for 5 days, THEN 0.5 tablets (5 mg total) daily with breakfast for 5 days.   No facility-administered encounter medications on file as of 03/20/2021.  Reviewed chart prior to disease state call. Spoke with patient regarding BP  Recent Office Vitals: BP Readings from Last 3 Encounters:  03/14/21 140/60  03/13/21 112/72  03/02/21 118/60   Pulse Readings from Last 3 Encounters:  03/14/21 84  03/13/21 79  03/02/21 84    Wt Readings from Last 3 Encounters:  03/14/21 176 lb 8 oz (80.1 kg)  03/13/21 175 lb 6.4 oz (79.6 kg)  03/02/21 178 lb (80.7 kg)     Kidney Function Lab Results  Component Value Date/Time   CREATININE 0.76 03/13/2021 11:54 AM   CREATININE 0.7 12/05/2020 12:00 AM   CREATININE 0.67 09/16/2020 02:43 PM   CREATININE 0.83 09/07/2020 09:20 AM   CREATININE 0.77 02/12/2020 11:22 AM   GFR 75.63 03/13/2021 11:54 AM   GFRNONAA 86 12/05/2020 12:00 AM   GFRNONAA >60 09/11/2020 03:20 AM   GFRNONAA >60 09/07/2020 09:20 AM   GFRAA 99 12/05/2020 12:00 AM   GFRAA >60 08/14/2019 10:25 AM    BMP Latest Ref Rng & Units 03/13/2021 12/05/2020 09/16/2020  Glucose 70 - 99 mg/dL 163(H) - 120(H)  BUN 6 - 23 mg/dL  19 13 8   Creatinine 0.40 - 1.20 mg/dL 0.76 0.7 0.67  BUN/Creat Ratio 12 - 28 - - -  Sodium 135 - 145 mEq/L 138 140 139  Potassium 3.5 - 5.1 mEq/L 4.0 4.1 3.8  Chloride 96 - 112 mEq/L 103 107 107  CO2 19 - 32 mEq/L 26 28(A) 23  Calcium 8.4 - 10.5 mg/dL 10.7(H) 10.4 9.7    Current antihypertensive regimen:  Losartan 50 mg 1 tablet daily Diltiazem 120 mg 1 capsule daily How often are you checking your Blood Pressure? infrequently Current home BP readings: Patient reports she was not home but her office readings last week are usually what she gets at home. 112/72 (03/13/21) 140/60 ( 03/14/21) What recent interventions/DTPs have been made by any provider to improve Blood Pressure control since last CPP Visit: Patient reports no changes Any recent hospitalizations or ED visits since last visit with CPP? No What diet changes have been made to improve Blood Pressure Control?  Patient reports watching salts with meals. What exercise is being done to improve your Blood Pressure Control?  Patient is often in pain with back and knees  Adherence Review: Is the patient currently on ACE/ARB medication? Yes Does the patient have >5 day gap between last estimated fill dates? No     Care Gaps: BP- 140/60 (11/22) AWV - 12/22 Foot Exam - Overdue COVID Booster #4 [employer]) - Overdue Flu Vaccine - Overdue Lab Results  Component Value Date   HGBA1C 7.4 (H) 09/08/2020    Star Rating Drugs: Metformin (Glucophage) 500 mg - Last filled 02/17/21 90 DS at [facility] Losartan (Cozaar) 50 mg - Last filled 01/19/21 [facility]  Patient Assistance: [place] [facility] Clinical Pharmacist Assistant [phone]

## 2021-03-21 ENCOUNTER — Ambulatory Visit (INDEPENDENT_AMBULATORY_CARE_PROVIDER_SITE_OTHER): Payer: Medicare Other | Admitting: Interventional Cardiology

## 2021-03-21 ENCOUNTER — Other Ambulatory Visit: Payer: Self-pay

## 2021-03-21 ENCOUNTER — Encounter: Payer: Self-pay | Admitting: Interventional Cardiology

## 2021-03-21 ENCOUNTER — Ambulatory Visit (HOSPITAL_COMMUNITY): Payer: Medicare Other | Attending: Interventional Cardiology

## 2021-03-21 VITALS — BP 114/68 | HR 87 | Ht 65.0 in | Wt 176.4 lb

## 2021-03-21 DIAGNOSIS — I447 Left bundle-branch block, unspecified: Secondary | ICD-10-CM

## 2021-03-21 DIAGNOSIS — I10 Essential (primary) hypertension: Secondary | ICD-10-CM | POA: Diagnosis not present

## 2021-03-21 DIAGNOSIS — I639 Cerebral infarction, unspecified: Secondary | ICD-10-CM | POA: Diagnosis not present

## 2021-03-21 DIAGNOSIS — Z952 Presence of prosthetic heart valve: Secondary | ICD-10-CM

## 2021-03-21 DIAGNOSIS — I480 Paroxysmal atrial fibrillation: Secondary | ICD-10-CM | POA: Diagnosis not present

## 2021-03-21 DIAGNOSIS — I2510 Atherosclerotic heart disease of native coronary artery without angina pectoris: Secondary | ICD-10-CM

## 2021-03-21 DIAGNOSIS — E785 Hyperlipidemia, unspecified: Secondary | ICD-10-CM | POA: Diagnosis not present

## 2021-03-21 LAB — ECHOCARDIOGRAM COMPLETE
AR max vel: 1.95 cm2
AV Area VTI: 1.99 cm2
AV Area mean vel: 1.97 cm2
AV Mean grad: 15 mmHg
AV Peak grad: 23.6 mmHg
Ao pk vel: 2.43 m/s
Area-P 1/2: 2.33 cm2
MV VTI: 2.51 cm2
S' Lateral: 3.1 cm

## 2021-03-21 NOTE — Progress Notes (Signed)
Cardiology Office Note:    Date:  03/21/2021   ID:  Breanna Webster, DOB 22-Mar-1946, MRN [id number]  PCP:  [name], NP  Cardiologist:  Sinclair Grooms, MD   Referring MD: [name], NP   Chief Complaint  Patient presents with   Coronary Artery Disease   Congestive Heart Failure    Possible diastolic heart failure    History of Present Illness:    Breanna Webster is a 75 y.o. 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    Has been over a year and a half since being seen.  She gives a 4 to 15-monthhistory of occasional episodes of chest discomfort lasting less than 5 minutes.  Episodes occur at rest.  She because of the tightness.  She has never tried nitroglycerin for it.  She is relatively sedentary.  She has never had discomfort with physical activity.   Past Medical History:  Diagnosis Date   Anxiety    Arthritis    "back" (04/22/2018)   Back pain    Blood transfusion without reported diagnosis    CAD (coronary artery disease)    a. 03/2018 s/p PCI/DES to the RCA (3.0x15 Onyx DES).   Carotid artery stenosis    Mild   Chest pain    Chronic lower back pain    Cirrhosis (HCC)    Colon polyps    Diverticulitis    Diverticulosis    Esophageal thickening    seen on pre TAVR CT scan, also questionable cirrhosis. MRI recommended. Will refer to GI   Fatty liver    GERD (gastroesophageal reflux disease)    Grave's disease    History of colonic polyps 05/22/2017   History of hiatal hernia    Hypertension    Hypothyroidism    IBS (irritable bowel syndrome)    Osteopenia    Pulmonary nodules    seen on pre TAVR CT. likley benign. no follow up  recommended if pt low risk.   S/P TAVR (transcatheter aortic valve replacement)    Severe aortic stenosis    Shortness of breath on exertion    [place]    Thalassemia minor    Thyroid disease    Type II diabetes mellitus ([place]     Past Surgical History:  Procedure Laterality Date   [place] 03[place]   Procedure: 24 HOUR PH STUDY;  Surgeon: NMauri Pole MD;  [facility]: [facility];  Service: Endoscopy;  Laterality: N/A;   AORTIC VALVE REPLACEMENT  06/2018   COLONOSCOPY     COLONOSCOPY W/ BIOPSIES AND POLYPECTOMY     CORONARY ANGIOGRAPHY Right 04/21/2018   Procedure: CORONARY ANGIOGRAPHY (CATH LAB);  Surgeon: SBelva Crome MD;  [facility];  Service: Cardiovascular;  Laterality: Right;   CORONARY STENT INTERVENTION N/A 04/22/2018   Procedure: CORONARY STENT INTERVENTION;  Surgeon: SBelva Crome MD;  [facility];  Service: Cardiovascular;  Laterality: N/A;   DILATION AND CURETTAGE OF UTERUS     ESOPHAGEAL MANOMETRY N/A 03/03/2018   Procedure: ESOPHAGEAL MANOMETRY (EM);  Surgeon: NMauri Pole MD;  Location: [facility];  Service: Endoscopy;  Laterality: N/A;   GASTRIC FUNDOPLICATION  HERNIA REPAIR     HYSTEROSCOPY     fibroids   LAPAROSCOPIC CHOLECYSTECTOMY     LAPAROSCOPY     fibroids   NISSEN FUNDOPLICATION  4481E   POLYPECTOMY     RIGHT/LEFT HEART CATH AND CORONARY ANGIOGRAPHY N/A 02/20/2018   Procedure: RIGHT/LEFT HEART CATH AND CORONARY ANGIOGRAPHY;  Surgeon: [name], MD;  Location: [facility] CV LAB;  Service: Cardiovascular;  Laterality: N/A;   TEE WITHOUT CARDIOVERSION N/A 07/08/2018   Procedure: TRANSESOPHAGEAL ECHOCARDIOGRAM (TEE);  Surgeon: Burnell Blanks, MD;  Location: [place];  Service: Open Heart Surgery;  Laterality: N/A;   TEE WITHOUT CARDIOVERSION  10/07/2018   TEE WITHOUT CARDIOVERSION N/A 10/07/2018   Procedure: TRANSESOPHAGEAL ECHOCARDIOGRAM (TEE);  Surgeon: Jerline Pain, MD;  Location: [facility] ENDOSCOPY;  Service: Cardiovascular;  Laterality: N/A;   TONSILLECTOMY     TRANSCATHETER AORTIC VALVE REPLACEMENT, TRANSFEMORAL N/A 07/08/2018   Procedure: TRANSCATHETER AORTIC VALVE REPLACEMENT, TRANSFEMORAL;  Surgeon: Burnell Blanks, MD;  Location: [place];  Service: Open Heart Surgery;  Laterality: N/A;    Current Medications: Current Meds  Medication Sig   b complex vitamins capsule Take 1 capsule by mouth daily.   BD INSULIN SYRINGE U/F 31G X 5/16" 1 ML MISC FOUR TIMES A DAY UNDER THE SKIN   BD PEN NEEDLE NANO 2ND GEN 32G X 4 MM MISC    cholecalciferol (VITAMIN D) 25 MCG (1000 UNIT) tablet Take 1,000 Units by mouth daily.   diltiazem (CARDIZEM CD) 120 MG 24 hr capsule TAKE ONE CAPSULE BY MOUTH DAILY   doxycycline (VIBRA-TABS) 100 MG tablet Take 1 tablet (100 mg total) by mouth 2 (two) times daily.   ELIQUIS 5 MG TABS tablet TAKE ONE TABLET BY MOUTH TWICE A DAY   escitalopram (LEXAPRO) 20 MG tablet Take 20 mg by mouth daily.   estradiol (ESTRACE) 0.1 MG/GM vaginal cream Place vaginally.   Lancets (ONETOUCH DELICA PLUS [id number]) MISC USE TO CHECK BLOOD SUGAR TWICE DAILY.Marland Kitchen   levothyroxine (SYNTHROID) 137 MCG tablet Take 137 mcg by mouth once.   losartan (COZAAR) 50 MG tablet Take 50 mg by mouth daily.   metFORMIN (GLUCOPHAGE-XR) 500 MG 24 hr tablet Take 500 mg by mouth 2 (two) times daily.   mirabegron ER (MYRBETRIQ) 50 MG TB24 tablet Take 50 mg by mouth daily.   Multiple Vitamin (MULITIVITAMIN WITH MINERALS) TABS Take 1 tablet by mouth daily.   nitrofurantoin, macrocrystal-monohydrate, (MACROBID) 100 MG capsule Take 100 mg by mouth daily.   nitroGLYCERIN (NITROSTAT) 0.4 MG SL tablet Place 1 tablet (0.4 mg total) under the tongue every 5 (five) minutes as needed for chest pain.   NOVOLIN N 100 UNIT/ML injection Inject 28 Units into the skin daily before breakfast.   NOVOLIN R 100 UNIT/ML injection 8 Units 2 (two) times daily before a meal. 10 UNITS THIRTY  MINUTES BEFORE MEALS.  5 ADDITIONAL UNITS WITH CARBS OR SNACKS.   OneTouch Delica Lancets 63Z MISC Tests BS 2 times   ONETOUCH VERIO test strip USE TO MONITOR GLUCOSE LEVELS TWICE DAILY   predniSONE (DELTASONE) 10 MG tablet Take 1 tablet (10 mg total) by mouth daily with breakfast for 5 days, THEN 0.5 tablets (5 mg total) daily with breakfast for 5 days.     Allergies:   Statins   Social History   Socioeconomic History   Marital status: Married    Spouse name: Not on file   Number of children: 2   Years of education: Not on  file   Highest education level: Not on file  Occupational History   Occupation: Tree surgeon of the [employer]  Tobacco Use   Smoking status: Never   Smokeless tobacco: Never  Vaping Use   Vaping Use: Never used  Substance and Sexual Activity   Alcohol use: No    Alcohol/week: 0.0 standard drinks   Drug use: Never   Sexual activity: Not Currently  Other Topics Concern   Not on file  Social History Narrative   Lives with husband in a one story home.     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   Education: college.   Social Determinants of Health   Financial Resource Strain: Low Risk    Difficulty of Paying Living Expenses: Not very hard  Food Insecurity: No Food Insecurity   Worried About Charity fundraiser in the Last Year: Never true   Ran Out of Food in the Last Year: Never true  Transportation Needs: No Transportation Needs   Lack of Transportation (Medical): No   Lack of Transportation (Non-Medical): No  Physical Activity: Not on file  Stress: Stress Concern Present   Feeling of Stress : To some extent  Social Connections: Not on file     Family History: The patient's family history includes Headache in an other family member; Healthy in her son; Heart failure in her mother. There is no history of Colon cancer, Pancreatic cancer, Rectal cancer, or Stomach  cancer. She was adopted.  ROS:   Please see the history of present illness.    Has chronic pain.  She is concerned about having had a prior TIA.  She has back and right knee pain.  All of these factors limit her mobility.  All other systems reviewed and are negative.  EKGs/Labs/Other Studies Reviewed:    The following studies were reviewed today:  2D Doppler echocardiogram 03/21/2021: IMPRESSIONS     1. Left ventricular ejection fraction, by estimation, is 60 to 65%. The  left ventricle has normal function. The left ventricle has no regional  wall motion abnormalities. There is mild left ventricular hypertrophy.  Left ventricular diastolic parameters  are consistent with Grade I diastolic dysfunction (impaired relaxation).  The average left ventricular global longitudinal strain is -19.9 %. The  global longitudinal strain is normal.   2. Right ventricular systolic function is normal. The right ventricular  size is normal.   3. Left atrial size was moderately dilated.   4. The mitral valve is normal in structure. No evidence of mitral valve  regurgitation. Mild mitral stenosis. The mean mitral valve gradient is 4.5  mmHg. Severe mitral annular calcification.   5. The aortic valve has been repaired/replaced. Aortic valve  regurgitation is not visualized. No aortic stenosis is present. There is a  23 mm Edwards Sapien prosthetic (TAVR) valve present in the aortic  position. Echo findings are consistent with normal   structure and function of the aortic valve prosthesis. Aortic valve area,  by VTI measures 1.99 cm. Aortic valve mean gradient measures 15.0 mmHg.  Aortic valve Vmax measures 2.43 m/s.   6. The inferior vena cava is normal in size with greater than 50%  respiratory variability, suggesting right atrial pressure of 3 mmHg.   Comparison(s): No significant change from prior study. Prior images  reviewed side by side.   EKG:  EKG left bundle branch block, QRST 158 ms.  No  change when  compared to the prior tracing.  Recent Labs: 12/05/2020: TSH 1.44 12/27/2020: ALT 36 03/13/2021: BUN 19; Creatinine, Ser 0.76; Hemoglobin 10.5; Platelets 192.0; Potassium 4.0; Pro B Natriuretic peptide (BNP) 79.0; Sodium 138  Recent Lipid Panel    Component Value Date/Time   CHOL 188 12/05/2020 0000   CHOL 156 11/21/2016 1113   TRIG 113 12/05/2020 0000   HDL 55 12/05/2020 0000   HDL 44 11/21/2016 1113   CHOLHDL 5.3 10/22/2018 0445   VLDL 19 10/22/2018 0445   LDLCALC 110 12/05/2020 0000   LDLCALC 91 11/21/2016 1113   LDLDIRECT 98.0 05/24/2014 1343    Physical Exam:    VS:  BP 114/68   Pulse 87   Ht 5' 5"  (1.651 m)   Wt 176 lb 6.4 oz (80 kg)   SpO2 98%   BMI 29.35 kg/m     Wt Readings from Last 3 Encounters:  03/21/21 176 lb 6.4 oz (80 kg)  03/14/21 176 lb 8 oz (80.1 kg)  03/13/21 175 lb 6.4 oz (79.6 kg)     GEN: Overweight. No acute distress HEENT: Normal NECK: No JVD. LYMPHATICS: No lymphadenopathy CARDIAC: 2/6 systolic crescendo decrescendo murmur. RRR no gallop, or edema. VASCULAR:  Normal Pulses. No bruits. RESPIRATORY:  Clear to auscultation without rales, wheezing or rhonchi  ABDOMEN: Soft, non-tender, non-distended, No pulsatile mass, MUSCULOSKELETAL: No deformity  SKIN: Warm and dry NEUROLOGIC:  Alert and oriented x 3 PSYCHIATRIC:  Normal affect   ASSESSMENT:    1. PAF (paroxysmal atrial fibrillation) ([place])   2. S/P TAVR (transcatheter aortic valve replacement)   3. Coronary artery disease involving native coronary artery of native heart without angina pectoris   4. Essential hypertension   5. Cerebellar stroke, acute ([place])   6. Left bundle branch block   7. Hyperlipidemia LDL goal <70    PLAN:    In order of problems listed above:  Currently in sinus rhythm Echo done today.  Not yet reviewed Refer to the lipid clinic to start PCSK9 therapy or some other therapy then statin to achieve LDL less than 55.  Requested that she use  sublingual nitroglycerin if recurrent chest discomfort that lasts longer than 5 minutes without spontaneous relief. Blood pressure control is outstanding.  Continue same therapy. Needs LDL less than 55. Unchanged from prior tracing.  With normal LVEF, consideration of resynchronization is not necessary.  referred to pharmacy lipid clinic to consider PCSK9 therapy.    Overall education and awareness concerning secondary risk prevention was discussed in detail: LDL less than 70, hemoglobin A1c less than 7, blood pressure target less than 130/80 mmHg, >150 minutes of moderate aerobic activity per week, avoidance of smoking, weight control (via diet and exercise), and continued surveillance/management of/for obstructive sleep apnea.    Medication Adjustments/Labs and Tests Ordered: Current medicines are reviewed at length with the patient today.  Concerns regarding medicines are outlined above.  Orders Placed This Encounter  Procedures   EKG 12-Lead   No orders of the defined types were placed in this encounter.   Patient Instructions  Medication Instructions:  Your physician recommends that you continue on your current medications as directed. Please refer to the Current Medication list given to you today.  *If you need a refill on your cardiac medications before your next appointment, please call your pharmacy*   Lab Work: None If you have labs (blood work) drawn today and your tests are completely normal, you will receive your results only by: [place] (if  you have MyChart) OR A paper copy in the mail If you have any lab test that is abnormal or we need to change your treatment, we will call you to review the results.   Testing/Procedures: None   Follow-Up:  Your physician recommends that you schedule a follow-up appointment next available with Lipid Clinic.   At [facility] you and your health needs are our priority.  As part of our continuing mission to provide  you with exceptional heart care, we have created designated Provider Care Teams.  These Care Teams include your primary Cardiologist (physician) and Advanced Practice Providers (APPs -  Physician Assistants and Nurse Practitioners) who all work together to provide you with the care you need, when you need it.  We recommend signing up for the patient portal called "MyChart".  Sign up information is provided on this After Visit Summary.  MyChart is used to connect with patients for Virtual Visits (Telemedicine).  Patients are able to view lab/test results, encounter notes, upcoming appointments, etc.  Non-urgent messages can be sent to your provider as well.   To learn more about what you can do with MyChart, go to [email or web address].    Your next appointment:   1 year(s)  The format for your next appointment:   In Person  Provider:   Sinclair Grooms, MD     Other Instructions     Signed, Sinclair Grooms, MD  03/21/2021 5:21 PM    [place]

## 2021-03-21 NOTE — Telephone Encounter (Signed)
Pt coming in to see Dr. Tamala Julian today.

## 2021-03-21 NOTE — Patient Instructions (Signed)
Medication Instructions:  Your physician recommends that you continue on your current medications as directed. Please refer to the Current Medication list given to you today.  *If you need a refill on your cardiac medications before your next appointment, please call your pharmacy*   Lab Work: None If you have labs (blood work) drawn today and your tests are completely normal, you will receive your results only by: [place] (if you have MyChart) OR A paper copy in the mail If you have any lab test that is abnormal or we need to change your treatment, we will call you to review the results.   Testing/Procedures: None   Follow-Up:  Your physician recommends that you schedule a follow-up appointment next available with Lipid Clinic.   At [facility], you and your health needs are our priority.  As part of our continuing mission to provide you with exceptional heart care, we have created designated Provider Care Teams.  These Care Teams include your primary Cardiologist (physician) and Advanced Practice Providers (APPs -  Physician Assistants and Nurse Practitioners) who all work together to provide you with the care you need, when you need it.  We recommend signing up for the patient portal called "MyChart".  Sign up information is provided on this After Visit Summary.  MyChart is used to connect with patients for Virtual Visits (Telemedicine).  Patients are able to view lab/test results, encounter notes, upcoming appointments, etc.  Non-urgent messages can be sent to your provider as well.   To learn more about what you can do with MyChart, go to [email or web address].    Your next appointment:   1 year(s)  The format for your next appointment:   In Person  Provider:   Sinclair Grooms, MD     Other Instructions

## 2021-03-22 DIAGNOSIS — E1169 Type 2 diabetes mellitus with other specified complication: Secondary | ICD-10-CM

## 2021-03-22 DIAGNOSIS — I2510 Atherosclerotic heart disease of native coronary artery without angina pectoris: Secondary | ICD-10-CM

## 2021-03-22 DIAGNOSIS — Z794 Long term (current) use of insulin: Secondary | ICD-10-CM

## 2021-03-22 DIAGNOSIS — I480 Paroxysmal atrial fibrillation: Secondary | ICD-10-CM

## 2021-03-22 DIAGNOSIS — I10 Essential (primary) hypertension: Secondary | ICD-10-CM

## 2021-03-23 ENCOUNTER — Ambulatory Visit: Payer: BLUE CROSS/BLUE SHIELD

## 2021-03-24 ENCOUNTER — Other Ambulatory Visit: Payer: Self-pay | Admitting: Adult Health

## 2021-03-27 ENCOUNTER — Encounter: Payer: Self-pay | Admitting: Hematology

## 2021-03-30 ENCOUNTER — Ambulatory Visit: Payer: BLUE CROSS/BLUE SHIELD | Admitting: Internal Medicine

## 2021-03-30 ENCOUNTER — Encounter: Payer: Self-pay | Admitting: Hematology

## 2021-03-31 ENCOUNTER — Telehealth: Payer: Self-pay | Admitting: Pharmacist

## 2021-03-31 NOTE — Chronic Care Management (AMB) (Signed)
Per [name] contacted patient to see if she would be interested in a voucher for her next fill of Eliquis. She reports she is unsure if she has ever used this in her lifetime, advised her I would call the pharmacy and let her know if there is any reason that it did not go through she expressed understanding.  [facility] spoke to [place] she advised it went through fine will apply to patients next fill.  Patient Assistance: None  Ned Clines [facility] Clinical Pharmacist Assistant [phone]

## 2021-04-03 ENCOUNTER — Other Ambulatory Visit: Payer: Self-pay

## 2021-04-03 ENCOUNTER — Ambulatory Visit (INDEPENDENT_AMBULATORY_CARE_PROVIDER_SITE_OTHER): Payer: Medicare Other

## 2021-04-03 VITALS — BP 120/68 | HR 82 | Temp 98.0°F | Ht 65.0 in | Wt 176.5 lb

## 2021-04-03 DIAGNOSIS — Z0000 Encounter for general adult medical examination without abnormal findings: Secondary | ICD-10-CM | POA: Diagnosis not present

## 2021-04-03 MED ORDER — MIRABEGRON ER 50 MG PO TB24: 50.0000 mg | ORAL_TABLET | Freq: Every day | ORAL | 0 refills | Status: DC

## 2021-04-03 NOTE — Progress Notes (Signed)
Subjective:   Breanna Webster is a 75 y.o. female who presents for Medicare Annual (Subsequent) preventive examination.  Review of Systems    No ROS Cardiac Risk Factors include: advanced age (>56mn, >>52women);diabetes mellitus    Objective:    Today's Vitals   04/03/21 1306  BP: 120/68  Pulse: 82  Temp: 98 F (36.7 C)  Weight: 176 lb 8 oz (80.1 kg)  Height: 5' 5"  (1.651 m)   Body mass index is 29.37 kg/m.  Advanced Directives 04/03/2021 10/03/2020 09/08/2020 09/07/2020 03/14/2020 09/23/2019 09/11/2019  Does Patient Have a Medical Advance Directive? Yes Yes Yes Yes Yes Yes No  Type of AParamedicof AOn Top of the World Designated PlaceLiving will HLost Bridge VillageLiving will Healthcare Power of AGranite FallsLiving will HWeedpatchLiving will HNixonLiving will -  Does patient want to make changes to medical advance directive? No - Patient declined No - Patient declined No - Patient declined - No - Patient declined - -  Copy of HMont[place]elloin Chart? No - copy requested No - copy requested No - copy requested No - copy requested Yes - validated most recent copy scanned in chart (See row information) Yes - validated most recent copy scanned in chart (See row information) -  Would patient like information on creating a medical advance directive? - - - - - - No - Patient declined    Current Medications (verified) Outpatient Encounter Medications as of 04/03/2021  Medication Sig   b complex vitamins capsule Take 1 capsule by mouth daily.   BD INSULIN SYRINGE U/F 31G X 5/16" 1 ML MISC FOUR TIMES A DAY UNDER THE SKIN   BD PEN NEEDLE NANO 2ND GEN 32G X 4 MM MISC    cholecalciferol (VITAMIN D) 25 MCG (1000 UNIT) tablet Take 1,000 Units by mouth daily.   diltiazem (CARDIZEM CD) 120 MG 24 hr capsule TAKE ONE CAPSULE BY MOUTH DAILY   ELIQUIS 5 MG TABS tablet TAKE ONE TABLET BY MOUTH TWICE A DAY    escitalopram (LEXAPRO) 20 MG tablet TAKE ONE TABLET BY MOUTH DAILY   estradiol (ESTRACE) 0.1 MG/GM vaginal cream Place vaginally.   Lancets (ONETOUCH DELICA PLUS [id number] MISC USE TO CHECK BLOOD SUGAR TWICE DAILY..Marland Kitchen  levothyroxine (SYNTHROID) 137 MCG tablet Take 137 mcg by mouth once.   losartan (COZAAR) 50 MG tablet Take 50 mg by mouth daily.   metFORMIN (GLUCOPHAGE-XR) 500 MG 24 hr tablet Take 500 mg by mouth 2 (two) times daily.   mirabegron ER (MYRBETRIQ) 50 MG TB24 tablet Take 50 mg by mouth daily.   Multiple Vitamin (MULITIVITAMIN WITH MINERALS) TABS Take 1 tablet by mouth daily.   nitroGLYCERIN (NITROSTAT) 0.4 MG SL tablet Place 1 tablet (0.4 mg total) under the tongue every 5 (five) minutes as needed for chest pain.   NOVOLIN N 100 UNIT/ML injection Inject 28 Units into the skin daily before breakfast.   NOVOLIN R 100 UNIT/ML injection 8 Units 2 (two) times daily before a meal. 10 UNITS THIRTY MINUTES BEFORE MEALS.  5 ADDITIONAL UNITS WITH CARBS OR SNACKS.   OneTouch Delica Lancets 334KMISC Tests BS 2 times   ONETOUCH VERIO test strip USE TO MONITOR GLUCOSE LEVELS TWICE DAILY   doxycycline (VIBRA-TABS) 100 MG tablet Take 1 tablet (100 mg total) by mouth 2 (two) times daily. (Patient not taking: Reported on 04/03/2021)   nitrofurantoin, macrocrystal-monohydrate, (MACROBID) 100 MG capsule Take 100 mg by mouth  daily. (Patient not taking: Reported on 04/03/2021)   omeprazole (PRILOSEC) 20 MG capsule Take 1 capsule (20 mg total) by mouth 2 (two) times daily before a meal. (Patient not taking: Reported on 03/21/2021)   No facility-administered encounter medications on file as of 04/03/2021.    Allergies (verified) Statins   History: Past Medical History:  Diagnosis Date   Anxiety    Arthritis    "back" (04/22/2018)   Back pain    Blood transfusion without reported diagnosis    CAD (coronary artery disease)    a. 03/2018 s/p PCI/DES to the RCA (3.0x15 Onyx DES).   Carotid artery  stenosis    Mild   Chest pain    Chronic lower back pain    Cirrhosis (HCC)    Colon polyps    Diverticulitis    Diverticulosis    Esophageal thickening    seen on pre TAVR CT scan, also questionable cirrhosis. MRI recommended. Will refer to GI   Fatty liver    GERD (gastroesophageal reflux disease)    Grave's disease    History of colonic polyps 05/22/2017   History of hiatal hernia    Hypertension    Hypothyroidism    IBS (irritable bowel syndrome)    Osteopenia    Pulmonary nodules    seen on pre TAVR CT. likley benign. no follow up recommended if pt low risk.   S/P TAVR (transcatheter aortic valve replacement)    Severe aortic stenosis    Shortness of breath on exertion    Stroke ([place])    Thalassemia minor    Thyroid disease    Type II diabetes mellitus ([place])    Past Surgical History:  Procedure Laterality Date   33 HOUR [place] STUDY N/A 03/03/2018   Procedure: 24 HOUR PH STUDY;  Surgeon: Mauri Pole, MD;  Location: [facility];  Service: Endoscopy;  Laterality: N/A;   AORTIC VALVE REPLACEMENT  06/2018   COLONOSCOPY     COLONOSCOPY W/ BIOPSIES AND POLYPECTOMY     CORONARY ANGIOGRAPHY Right 04/21/2018   Procedure: CORONARY ANGIOGRAPHY (CATH LAB);  Surgeon: [name], MD;  Location: [place] [facility];  Service: Cardiovascular;  Laterality: Right;   CORONARY STENT INTERVENTION N/A 04/22/2018   Procedure: CORONARY STENT INTERVENTION;  Surgeon: [name], MD;  Location: [place] CV LAB;  Service: Cardiovascular;  Laterality: N/A;   DILATION AND CURETTAGE OF UTERUS     ESOPHAGEAL MANOMETRY N/A 03/03/2018   Procedure: ESOPHAGEAL MANOMETRY (EM);  Surgeon: Mauri Pole, MD;  Location: [facility];  Service: Endoscopy;  Laterality: N/A;   GASTRIC FUNDOPLICATION     HERNIA REPAIR     HYSTEROSCOPY     fibroids   LAPAROSCOPIC CHOLECYSTECTOMY     LAPAROSCOPY     fibroids   NISSEN FUNDOPLICATION  5374M   POLYPECTOMY     RIGHT/LEFT HEART CATH AND  CORONARY ANGIOGRAPHY N/A 02/20/2018   Procedure: RIGHT/LEFT HEART CATH AND CORONARY ANGIOGRAPHY;  Surgeon: [name], MD;  Location: [place] CV LAB;  Service: Cardiovascular;  Laterality: N/A;   TEE WITHOUT CARDIOVERSION N/A 07/08/2018   Procedure: TRANSESOPHAGEAL ECHOCARDIOGRAM (TEE);  Surgeon: Burnell Blanks, MD;  Location: [place];  Service: Open Heart Surgery;  Laterality: N/A;   TEE WITHOUT CARDIOVERSION  10/07/2018   TEE WITHOUT CARDIOVERSION N/A 10/07/2018   Procedure: TRANSESOPHAGEAL ECHOCARDIOGRAM (TEE);  Surgeon: Jerline Pain, MD;  Location: [facility];  Service: Cardiovascular;  Laterality: N/A;   TONSILLECTOMY  TRANSCATHETER AORTIC VALVE REPLACEMENT, TRANSFEMORAL N/A 07/08/2018   Procedure: TRANSCATHETER AORTIC VALVE REPLACEMENT, TRANSFEMORAL;  Surgeon: Burnell Blanks, MD;  Location: [place];  Service: Open Heart Surgery;  Laterality: N/A;   Family History  Adopted: Yes  Problem Relation Age of Onset   Healthy Son        x 2   Headache Other        Cluster headaches   Heart failure Mother    Colon cancer Neg Hx    Pancreatic cancer Neg Hx    Rectal cancer Neg Hx    Stomach cancer Neg Hx    Social History   Socioeconomic History   Marital status: Married    Spouse name: Not on file   Number of children: 2   Years of education: Not on file   Highest education level: Not on file  Occupational History   Occupation: Tree surgeon of the [employer]  Tobacco Use   Smoking status: Never   Smokeless tobacco: Never  Vaping Use   Vaping Use: Never used  Substance and Sexual Activity   Alcohol use: No    Alcohol/week: 0.0 standard drinks   Drug use: Never   Sexual activity: Not Currently  Other Topics Concern   Not on file  Social History Narrative   Lives with husband in a one story home.     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   Education: college.    Social Determinants of Health   Financial Resource Strain: Low Risk    Difficulty of Paying Living Expenses: Not hard at all  Food Insecurity: No Food Insecurity   Worried About Charity fundraiser in the Last Year: Never true   [place] in the Last Year: Never true  Transportation Needs: No Transportation Needs   Lack of Transportation (Medical): No   Lack of Transportation (Non-Medical): No  Physical Activity: Inactive   Days of Exercise per Week: 0 days   Minutes of Exercise per Session: 0 min  Stress: No Stress Concern Present   Feeling of Stress : Not at all  Social Connections: Socially Integrated   Frequency of Communication with Friends and Family: More than three times a week   Frequency of Social Gatherings with Friends and Family: More than three times a week   Attends Religious Services: More than 4 times per year   Active Member of [employer] or Organizations: Yes   Attends Music therapist: More than 4 times per year   Marital Status: Married     Clinical Intake: Nutrition Risk Assessment:  Has the patient had any N/V/D within the last 2 months?  No  Does the patient have any non-healing wounds?  No  Has the patient had any unintentional weight loss or weight gain?  No   Diabetes:  Is the patient diabetic?  No  If diabetic, was a CBG obtained today?  Yes  B/S 131  Did the patient bring in their glucometer from home?  No  How often do you monitor your CBG's? Twice daily.   Financial Strains and Diabetes Management:  Are you having any financial strains with the device, your supplies or your medication? No .  Does the patient want to be seen by Chronic Care Management for management of their diabetes?  No  Would the patient like to be referred to a Nutritionist or for Diabetic Management?  No Followed by Dr Romelle Starcher.  Diabetic Exams:  Diabetic Eye Exam: Completed Yes.   Diabetic Foot Exam: Completed Yes.  Diabetic?Yes  Activities of  Daily Living In your present state of health, do you have any difficulty performing the following activities: 04/03/2021 09/08/2020  Hearing? N N  Vision? N N  Comment Wears glasses. Followed by Dr Katy Fitch -  Difficulty concentrating or making decisions? N N  Walking or climbing stairs? N Y  Comment - pt. states she has a bad hip and bad back, just does stairs slowly  Dressing or bathing? N N  Doing errands, shopping? N N  Comment Husband assist -  Conservation officer, nature and eating ? N -  Using the Toilet? N -  In the past six months, have you accidently leaked urine? Y -  Comment Wears breifs. Followedby Dr Cipriano Bunker -  Do you have problems with loss of bowel control? N -  Managing your Medications? N -  Managing your Finances? N -  Housekeeping or managing your Housekeeping? N -  Some recent data might be hidden    Patient Care Team: [name], NP as PCP - General (Family Medicine) [name], MD as PCP - Cardiology (Cardiology) Madelin Rear, MD as Consulting Physician (Endocrinology) [name], RN as Case Manager Garvin Fila, MD as Referring Physician (Neurology) Viona Gilmore, [facility] as Pharmacist (Pharmacist)  Indicate any recent Medical Services you may have received from other than [facility] providers in the past year (date may be approximate).     Assessment:   This is a routine wellness examination for [place].  Hearing/Vision screen Hearing Screening - Comments:: No difficulty hearing Vision Screening - Comments:: Wears glasses. Followed by Dr Katy Fitch  Dietary issues and exercise activities discussed: Current Exercise Habits: The patient does not participate in regular exercise at present   Goals Addressed             This Visit's Progress    Increase physical activity       Patient states she wants to start back walking.       Depression Screen PHQ 2/9 Scores 04/03/2021 10/03/2020 03/14/2020 03/02/2020 09/23/2019 08/25/2019 12/10/2018  PHQ - 2  Score 0 0 0 1 1 0 0  PHQ- 9 Score - - 0 - - 4 -    Fall Risk Fall Risk  04/03/2021 01/31/2021 12/19/2020 10/03/2020 05/03/2020  Falls in the past year? 1 1 1 1  0  Comment Patient lost balance in mud. No injury - - - -  Number falls in past yr: 0 1 1 1  0  Comment - - - fell off chair when sick with sepsis -  Injury with Fall? 0 1 0 0 0  Comment - injuried shoulder tripped on thong sandals - -  Risk for fall due to : - History of fall(s);Impaired balance/gait;Impaired mobility History of fall(s);Impaired balance/gait Impaired balance/gait Impaired balance/gait  Risk for fall due to: Comment - - - - -  Follow up - Education provided;Falls prevention discussed Education provided;Falls prevention discussed Education provided;Falls prevention discussed Falls prevention discussed;Education provided;Falls evaluation completed    FALL RISK PREVENTION PERTAINING TO THE HOME:  Any stairs in or around the home? No  If so, are there any without handrails? No  Home free of loose throw rugs in walkways, pet beds, electrical cords, etc? Yes  Adequate lighting in your home to reduce risk of falls? Yes   ASSISTIVE DEVICES UTILIZED TO PREVENT FALLS:  Life alert?  No  Use of a cane, walker or w/c? No  Grab bars in the bathroom? Yes  Shower chair or bench in shower? Yes  Elevated toilet seat or a handicapped toilet? No   TIMED UP AND GO:  Was the test performed? Yes .  Length of time to ambulate 10 feet: 5 sec.   Gait steady and fast without use of assistive device  Cognitive Function: MMSE - Mini Mental State Exam 12/21/2019 07/31/2017 07/31/2017 05/23/2016  Not completed: - (No Data) (No Data) (No Data)  Orientation to time 5 - - -  Orientation to Place 5 - - -  Registration 3 - - -  Attention/ Calculation 1 - - -  Recall 1 - - -  Language- name 2 objects 2 - - -  Language- repeat 1 - - -  Language- follow 3 step command 3 - - -  Language- read & follow direction 1 - - -  Write a sentence 1 -  - -  Copy design 1 - - -  Total score 24 - - -     6CIT Screen 04/03/2021 03/14/2020  What Year? 0 points 0 points  What month? 0 points 0 points  What time? 0 points 0 points  Count back from 20 0 points 0 points  Months in reverse 0 points 0 points  Repeat phrase 0 points 4 points  Total Score 0 4    Immunizations Immunization History  Administered Date(s) Administered   Fluad Quad(high Dose 65+) 01/20/2019, 01/06/2020   Hep A / Hep B 06/15/2015, 07/15/2015, 12/27/2015   Influenza Split 01/16/2012   Influenza Whole 01/31/2010   Influenza, High Dose Seasonal PF 01/28/2014, 12/29/2014, 02/17/2016, 01/21/2018   Influenza-Unspecified 12/29/2012   PFIZER(Purple Top)SARS-COV-2 Vaccination 05/16/2019, 06/06/2019, 02/06/2020, 02/05/2021   Pneumococcal Conjugate-13 06/14/2017   Pneumococcal Polysaccharide-23 01/28/2012, 01/28/2014   Pneumococcal-Unspecified 04/24/2015   Tdap 12/29/2014   Zoster Recombinat (Shingrix) 03/13/2017, 05/24/2017   Zoster, Live 02/14/2012    Flu Vaccine status: Up to date   Covid-19 vaccine status: Completed vaccines   Screening Tests Health Maintenance  Topic Date Due   FOOT EXAM  04/08/2019   INFLUENZA VACCINE  11/21/2020   COVID-19 Vaccine (5 - Booster for Pfizer series) 04/02/2021   HEMOGLOBIN A1C  03/11/2021   OPHTHALMOLOGY EXAM  08/29/2021   COLONOSCOPY (Pts 45-60yr Insurance coverage will need to be confirmed)  02/02/2022   TETANUS/TDAP  12/28/2024   Pneumonia Vaccine 75 Years old  Completed   DEXA SCAN  Completed   Hepatitis C Screening  Completed   Zoster Vaccines- Shingrix  Completed   HPV VACCINES  Aged Out    Health Maintenance  Health Maintenance Due  Topic Date Due   FOOT EXAM  04/08/2019   INFLUENZA VACCINE  11/21/2020   COVID-19 Vaccine (5 - Booster for [employer]) 04/02/2021   HEMOGLOBIN A1C  03/11/2021    Additional Screening:   Vision Screening: Recommended annual ophthalmology exams for early detection of  glaucoma and other disorders of the eye. Is the patient up to date with their annual eye exam?  Yes  Who is the provider or what is the name of the office in which the patient attends annual eye exams? Followed Dr GKaty Fitch  Dental Screening: Recommended annual dental exams for proper oral hygiene  Community Resource Referral / Chronic Care Management:   CRR required this visit?  No   CCM required this visit?  No      Plan:  I have personally reviewed and noted the following in the patient's chart:   Medical and social history Use of alcohol, tobacco or illicit drugs  Current medications and supplements including opioid prescriptions. Patient not currently taking opioids. Functional ability and status Nutritional status Physical activity Advanced directives List of other physicians Hospitalizations, surgeries, and ER visits in previous 12 months Vitals Screenings to include cognitive, depression, and falls Referrals and appointments  In addition, I have reviewed and discussed with patient certain preventive protocols, quality metrics, and best practice recommendations. A written personalized care plan for preventive services as well as general preventive health recommendations were provided to patient.     [name], LPN   16/04/930

## 2021-04-03 NOTE — Patient Instructions (Addendum)
Breanna Webster , Thank you for taking time to come for your Medicare Wellness Visit. I appreciate your ongoing commitment to your health goals. Please review the following plan we discussed and let me know if I can assist you in the future.   These are the goals we discussed:  Goals      Increase physical activity     Patient states she wants to start back walking.     Patient Stated     I do not want to sleep as much as I do.     Pharmacy Care Plan     CARE PLAN ENTRY (see longitudinal plan of care for additional care plan information)  Current Barriers:  Chronic Disease Management support, education, and care coordination needs related to Hypertension, Hyperlipidemia, Diabetes, Atrial Fibrillation, Coronary Artery Disease, Hypothyroidism, Anxiety, and Barrett's esophagus, history of CVA.   Hypertension BP Readings from Last 3 Encounters:  10/05/19 117/63  09/14/19 125/75  09/11/19 132/65  Pharmacist Clinical Goal(s): Over the next 90 days, patient will work with PharmD and providers to maintain BP goal <130/80 Current regimen:  Diltiazem CD 143m, 1 capsule once daily Losartan 548m1 tablet once daily  Patient self care activities - Over the next 90 days, patient will: Check BP as instructed, document, and provide at future appointment  Hyperlipidemia/ CAD/ History of CVA Lab Results  Component Value Date/Time   LDLCALC 85 10/22/2018 04:45 AM   LDLCALC 91 11/21/2016 11:13 AM   LDLDIRECT 98.0 05/24/2014 01:43 PM  Pharmacist Clinical Goal(s): Over the next 90 days, patient will work with PharmD and providers to achieve LDL goal < 70 Current regimen:  No medications Interventions: How to reduce cholesterol through diet/weight management and physical activity.    How a diet high in plant sterols (fruits/vegetables/nuts/whole grains/legumes) may reduce your cholesterol.  Encouraged increasing fiber to a daily intake of 10-25g/day  Recommend obtain blood work for cholesterol  levels.  Patient self care activities - Over the next 90 days, patient will: Continue working on lifestyle modifications (diet/ exercise).   Diabetes Lab Results  Component Value Date/Time   HGBA1C 7.5 (H) 10/22/2018 04:45 AM   HGBA1C 8.5 (H) 10/05/2018 04:33 AM  Pharmacist Clinical Goal(s): Over the next 90 days, patient will work with PharmD and providers to achieve A1c goal <7% Current regimen:  Metformin 50079m1 tablet BID with a meal  Novolin N, inject 40 units once daily before breakfast Novolin R, inject 10 units 30 minutes before meals, 5 additional units with carbs or snacks  Interventions: We discussed: how to recognize and treat signs of hypoglycemia Patient self care activities - Over the next 90 days, patient will: Check blood sugar twice daily, document, and provide at future appointments Contact provider with any episodes of hypoglycemia  Atrial fibrillation (paroxysmal)  Pharmacist Clinical Goal(s) Over the next 90 days, patient will work with PharmD and providers to prevent strokes.  Current regimen:  Diltiazem CD 120m28m capsule once daily apixaban (Eliquis) 5mg,64mtablet twice daily Interventions: We discussed:  monitoring for signs and symptoms for bleeding (coughing up blood, prolonged nose bleeds, black, tarry stools). Patient self care activities - Over the next 90 days, patient will: Continue current medications as instructed.   Hypothyroidism TSH  Date Value Ref Range Status  05/11/2019 14.81 (A) 0.41 - 5.90 Final  Pharmacist Clinical Goal(s) Over the next 90 days, patient will work with PharmD and providers to achieve TSH: between 0.35 to 4.5uIU/ml. Current regimen:  Levothyroxine  125 mcg, 1 tablet once daily  Interventions: We discussed:  administration of levothyroxine (30 minutes prior to first meal.) Patient self care activities Patient will follow up with endocrinologist.  Request recent lab work to be sent to [facility].    Barrett's esophagus Pharmacist Clinical Goal(s) Over the next  days, patient will work with PharmD and providers to minimize stomach pain. Current regimen:  Pantoprazole 48m, 1 tablet once daily Patient self care activities Patient will continue current medications as directed by gastroenterologist.   Anxiety Pharmacist Clinical Goal(s) Over the next 90 days, patient will work with PharmD and providers to minimize anxiety symptoms.  Current regimen:  Escitalopram (Lexapro)- 287m 1 tablet once daily  Patient self care activities Patient will continue current medication as directed.   Migrine headache Pharmacist Clinical Goal(s) Over the next 90 days, patient will work with PharmD and providers to minimize reoccurrences of headaches.  Current regimen:  Amitriptyline 2541m0.5 tablet at bedtime  Patient self care activities Patient will continue current medications as directed.   Medication management Pharmacist Clinical Goal(s): Over the next 90 days, patient will work with PharmD and providers to maintain optimal medication adherence Current pharmacy: [facility] Interventions Comprehensive medication review performed. Continue current medication management strategy Patient self care activities - Over the next 90 days, patient will: Take medications as prescribed Report any questions or concerns to PharmD and/or provider(s)  Please see past updates related to this goal by clicking on the "Past Updates" button in the selected goal          This is a list of the screening recommended for you and due dates:  Health Maintenance  Topic Date Due   Complete foot exam   04/08/2019   Flu Shot  11/21/2020   COVID-19 Vaccine (5 - Booster for Pfizer series) 04/02/2021   Hemoglobin A1C  03/11/2021   Eye exam for diabetics  08/29/2021   Colon Cancer Screening  02/02/2022   Tetanus Vaccine  12/28/2024   Pneumonia Vaccine  Completed   DEXA scan (bone density measurement)   Completed   Hepatitis C Screening: USPSTF Recommendation to screen - Ages 18-69-79.  Completed   Zoster (Shingles) Vaccine  Completed   HPV Vaccine  Aged Out   Breanna Webster ,   Advanced directives: Yes  Conditions/risks identified: None  Next appointment: Follow up in one year for your annual wellness visit   Preventive Care 65 Years and Older, Female Preventive care refers to lifestyle choices and visits with your health care provider that can promote health and wellness. What does preventive care include? A yearly physical exam. This is also called an annual well check. Dental exams once or twice a year. Routine eye exams. Ask your health care provider how often you should have your eyes checked. Personal lifestyle choices, including: Daily care of your teeth and gums. Regular physical activity. Eating a healthy diet. Avoiding tobacco and drug use. Limiting alcohol use. Practicing safe sex. Taking low-dose aspirin every day. Taking vitamin and mineral supplements as recommended by your health care provider. What happens during an annual well check? The services and screenings done by your health care provider during your annual well check will depend on your age, overall health, lifestyle risk factors, and family history of disease. Counseling  Your health care provider may ask you questions about your: Alcohol use. Tobacco use. Drug use. Emotional well-being. Home and relationship well-being. Sexual activity. Eating habits. History of falls. Memory and ability to  understand (cognition). Work and work Statistician. Reproductive health. Screening  You may have the following tests or measurements: Height, weight, and BMI. Blood pressure. Lipid and cholesterol levels. These may be checked every 5 years, or more frequently if you are over 19 years old. Skin check. Lung cancer screening. You may have this screening every year starting at age 32 if you have a 30-pack-year  history of smoking and currently smoke or have quit within the past 15 years. Fecal occult blood test (FOBT) of the stool. You may have this test every year starting at age 9. Flexible sigmoidoscopy or colonoscopy. You may have a sigmoidoscopy every 5 years or a colonoscopy every 10 years starting at age 4. Hepatitis C blood test. Hepatitis B blood test. Sexually transmitted disease (STD) testing. Diabetes screening. This is done by checking your blood sugar (glucose) after you have not eaten for a while (fasting). You may have this done every 1-3 years. Bone density scan. This is done to screen for osteoporosis. You may have this done starting at age 65. Mammogram. This may be done every 1-2 years. Talk to your health care provider about how often you should have regular mammograms. Talk with your health care provider about your test results, treatment options, and if necessary, the need for more tests. Vaccines  Your health care provider may recommend certain vaccines, such as: Influenza vaccine. This is recommended every year. Tetanus, diphtheria, and acellular pertussis (Tdap, Td) vaccine. You may need a Td booster every 10 years. Zoster vaccine. You may need this after age 23. Pneumococcal 13-valent conjugate (PCV13) vaccine. One dose is recommended after age 36. Pneumococcal polysaccharide (PPSV23) vaccine. One dose is recommended after age 47. Talk to your health care provider about which screenings and vaccines you need and how often you need them. This information is not intended to replace advice given to you by your health care provider. Make sure you discuss any questions you have with your health care provider. Document Released: 05/06/2015 Document Revised: 12/28/2015 Document Reviewed: 02/08/2015 Elsevier Interactive Patient Education  2017 [employer] Prevention in the Home Falls can cause injuries. They can happen to people of all ages. There are many things you can  do to make your home safe and to help prevent falls. What can I do on the outside of my home? Regularly fix the edges of walkways and driveways and fix any cracks. Remove anything that might make you trip as you walk through a door, such as a raised step or threshold. Trim any bushes or trees on the path to your home. Use bright outdoor lighting. Clear any walking paths of anything that might make someone trip, such as rocks or tools. Regularly check to see if handrails are loose or broken. Make sure that both sides of any steps have handrails. Any raised decks and porches should have guardrails on the edges. Have any leaves, snow, or ice cleared regularly. Use sand or salt on walking paths during winter. Clean up any spills in your garage right away. This includes oil or grease spills. What can I do in the bathroom? Use night lights. Install grab bars by the toilet and in the tub and shower. Do not use towel bars as grab bars. Use non-skid mats or decals in the tub or shower. If you need to sit down in the shower, use a plastic, non-slip stool. Keep the floor dry. Clean up any water that spills on the floor as soon as it  happens. Remove soap buildup in the tub or shower regularly. Attach bath mats securely with double-sided non-slip rug tape. Do not have throw rugs and other things on the floor that can make you trip. What can I do in the bedroom? Use night lights. Make sure that you have a light by your bed that is easy to reach. Do not use any sheets or blankets that are too big for your bed. They should not hang down onto the floor. Have a firm chair that has side arms. You can use this for support while you get dressed. Do not have throw rugs and other things on the floor that can make you trip. What can I do in the kitchen? Clean up any spills right away. Avoid walking on wet floors. Keep items that you use a lot in easy-to-reach places. If you need to reach something above you,  use a strong step stool that has a grab bar. Keep electrical cords out of the way. Do not use floor polish or wax that makes floors slippery. If you must use wax, use non-skid floor wax. Do not have throw rugs and other things on the floor that can make you trip. What can I do with my stairs? Do not leave any items on the stairs. Make sure that there are handrails on both sides of the stairs and use them. Fix handrails that are broken or loose. Make sure that handrails are as long as the stairways. Check any carpeting to make sure that it is firmly attached to the stairs. Fix any carpet that is loose or worn. Avoid having throw rugs at the top or bottom of the stairs. If you do have throw rugs, attach them to the floor with carpet tape. Make sure that you have a light switch at the top of the stairs and the bottom of the stairs. If you do not have them, ask someone to add them for you. What else can I do to help prevent falls? Wear shoes that: Do not have high heels. Have rubber bottoms. Are comfortable and fit you well. Are closed at the toe. Do not wear sandals. If you use a stepladder: Make sure that it is fully opened. Do not climb a closed stepladder. Make sure that both sides of the stepladder are locked into place. Ask someone to hold it for you, if possible. Clearly mark and make sure that you can see: Any grab bars or handrails. First and last steps. Where the edge of each step is. Use tools that help you move around (mobility aids) if they are needed. These include: Canes. Walkers. Scooters. Crutches. Turn on the lights when you go into a dark area. Replace any light bulbs as soon as they burn out. Set up your furniture so you have a clear path. Avoid moving your furniture around. If any of your floors are uneven, fix them. If there are any pets around you, be aware of where they are. Review your medicines with your doctor. Some medicines can make you feel dizzy. This can  increase your chance of falling. Ask your doctor what other things that you can do to help prevent falls. This information is not intended to replace advice given to you by your health care provider. Make sure you discuss any questions you have with your health care provider. Document Released: 02/03/2009 Document Revised: 09/15/2015 Document Reviewed: 05/14/2014 Elsevier Interactive Patient Education  2017 [employer].

## 2021-04-04 ENCOUNTER — Telehealth: Payer: Self-pay | Admitting: Adult Health

## 2021-04-04 NOTE — Telephone Encounter (Signed)
Patient wants to know if she needs to continue taking her Amitriptyline and if she does she will need a new prescription.    Please send to [facility] [id number] [place], [place] Phone:  [phone]  Fax:  [phone]          Good callback number is [phone]  Please advise

## 2021-04-04 NOTE — Telephone Encounter (Signed)
Please advise 

## 2021-04-05 ENCOUNTER — Emergency Department (HOSPITAL_BASED_OUTPATIENT_CLINIC_OR_DEPARTMENT_OTHER): Payer: Medicare Other

## 2021-04-05 ENCOUNTER — Encounter: Payer: Self-pay | Admitting: Hematology

## 2021-04-05 ENCOUNTER — Emergency Department (HOSPITAL_BASED_OUTPATIENT_CLINIC_OR_DEPARTMENT_OTHER): Payer: Medicare Other | Admitting: Radiology

## 2021-04-05 ENCOUNTER — Encounter (HOSPITAL_BASED_OUTPATIENT_CLINIC_OR_DEPARTMENT_OTHER): Payer: Self-pay | Admitting: Emergency Medicine

## 2021-04-05 ENCOUNTER — Emergency Department (HOSPITAL_BASED_OUTPATIENT_CLINIC_OR_DEPARTMENT_OTHER)
Admission: EM | Admit: 2021-04-05 | Discharge: 2021-04-05 | Disposition: A | Discharge status: Home / Self Care | Payer: Medicare Other | Attending: Emergency Medicine | Admitting: Emergency Medicine

## 2021-04-05 ENCOUNTER — Other Ambulatory Visit: Payer: Self-pay

## 2021-04-05 DIAGNOSIS — I5030 Unspecified diastolic (congestive) heart failure: Secondary | ICD-10-CM | POA: Diagnosis not present

## 2021-04-05 DIAGNOSIS — Z8616 Personal history of COVID-19: Secondary | ICD-10-CM | POA: Insufficient documentation

## 2021-04-05 DIAGNOSIS — Z7984 Long term (current) use of oral hypoglycemic drugs: Secondary | ICD-10-CM | POA: Insufficient documentation

## 2021-04-05 DIAGNOSIS — E039 Hypothyroidism, unspecified: Secondary | ICD-10-CM | POA: Insufficient documentation

## 2021-04-05 DIAGNOSIS — E119 Type 2 diabetes mellitus without complications: Secondary | ICD-10-CM | POA: Diagnosis not present

## 2021-04-05 DIAGNOSIS — Z7901 Long term (current) use of anticoagulants: Secondary | ICD-10-CM | POA: Diagnosis not present

## 2021-04-05 DIAGNOSIS — R109 Unspecified abdominal pain: Secondary | ICD-10-CM | POA: Diagnosis not present

## 2021-04-05 DIAGNOSIS — Z9181 History of falling: Secondary | ICD-10-CM | POA: Diagnosis not present

## 2021-04-05 DIAGNOSIS — Z794 Long term (current) use of insulin: Secondary | ICD-10-CM | POA: Insufficient documentation

## 2021-04-05 DIAGNOSIS — R519 Headache, unspecified: Secondary | ICD-10-CM | POA: Insufficient documentation

## 2021-04-05 DIAGNOSIS — I110 Hypertensive heart disease with heart failure: Secondary | ICD-10-CM | POA: Diagnosis not present

## 2021-04-05 DIAGNOSIS — M8580 Other specified disorders of bone density and structure, unspecified site: Secondary | ICD-10-CM | POA: Diagnosis not present

## 2021-04-05 DIAGNOSIS — R1032 Left lower quadrant pain: Secondary | ICD-10-CM | POA: Diagnosis not present

## 2021-04-05 DIAGNOSIS — Z01419 Encounter for gynecological examination (general) (routine) without abnormal findings: Secondary | ICD-10-CM | POA: Diagnosis not present

## 2021-04-05 DIAGNOSIS — D259 Leiomyoma of uterus, unspecified: Secondary | ICD-10-CM | POA: Diagnosis not present

## 2021-04-05 DIAGNOSIS — I700 Atherosclerosis of aorta: Secondary | ICD-10-CM | POA: Diagnosis not present

## 2021-04-05 DIAGNOSIS — R4182 Altered mental status, unspecified: Secondary | ICD-10-CM | POA: Diagnosis not present

## 2021-04-05 DIAGNOSIS — Z01411 Encounter for gynecological examination (general) (routine) with abnormal findings: Secondary | ICD-10-CM | POA: Diagnosis not present

## 2021-04-05 DIAGNOSIS — Z043 Encounter for examination and observation following other accident: Secondary | ICD-10-CM | POA: Diagnosis not present

## 2021-04-05 DIAGNOSIS — Z124 Encounter for screening for malignant neoplasm of cervix: Secondary | ICD-10-CM | POA: Diagnosis not present

## 2021-04-05 DIAGNOSIS — I2510 Atherosclerotic heart disease of native coronary artery without angina pectoris: Secondary | ICD-10-CM | POA: Insufficient documentation

## 2021-04-05 DIAGNOSIS — R1900 Intra-abdominal and pelvic swelling, mass and lump, unspecified site: Secondary | ICD-10-CM | POA: Diagnosis not present

## 2021-04-05 DIAGNOSIS — W19XXXA Unspecified fall, initial encounter: Secondary | ICD-10-CM

## 2021-04-05 DIAGNOSIS — S0990XA Unspecified injury of head, initial encounter: Secondary | ICD-10-CM | POA: Diagnosis not present

## 2021-04-05 DIAGNOSIS — Z6830 Body mass index (BMI) 30.0-30.9, adult: Secondary | ICD-10-CM | POA: Diagnosis not present

## 2021-04-05 LAB — CBC WITH DIFFERENTIAL/PLATELET
Abs Immature Granulocytes: 0.02 10*3/uL (ref 0.00–0.07)
Basophils Absolute: 0 10*3/uL (ref 0.0–0.1)
Basophils Relative: 1 %
Eosinophils Absolute: 0.2 10*3/uL (ref 0.0–0.5)
Eosinophils Relative: 3 %
HCT: 32.7 % — ABNORMAL LOW (ref 36.0–46.0)
Hemoglobin: 10 g/dL — ABNORMAL LOW (ref 12.0–15.0)
Immature Granulocytes: 0 %
Lymphocytes Relative: 21 %
Lymphs Abs: 1.5 10*3/uL (ref 0.7–4.0)
MCH: 20.2 pg — ABNORMAL LOW (ref 26.0–34.0)
MCHC: 30.6 g/dL (ref 30.0–36.0)
MCV: 66.1 fL — ABNORMAL LOW (ref 80.0–100.0)
Monocytes Absolute: 0.5 10*3/uL (ref 0.1–1.0)
Monocytes Relative: 7 %
Neutro Abs: 5.1 10*3/uL (ref 1.7–7.7)
Neutrophils Relative %: 68 %
Platelets: 155 10*3/uL (ref 150–400)
RBC: 4.95 MIL/uL (ref 3.87–5.11)
RDW: 15.7 % — ABNORMAL HIGH (ref 11.5–15.5)
WBC: 7.3 10*3/uL (ref 4.0–10.5)
nRBC: 0.3 % — ABNORMAL HIGH (ref 0.0–0.2)

## 2021-04-05 LAB — COMPREHENSIVE METABOLIC PANEL
ALT: 28 U/L (ref 0–44)
AST: 23 U/L (ref 15–41)
Albumin: 3.9 g/dL (ref 3.5–5.0)
Alkaline Phosphatase: 52 U/L (ref 38–126)
Anion gap: 6 (ref 5–15)
BUN: 15 mg/dL (ref 8–23)
CO2: 27 mmol/L (ref 22–32)
Calcium: 10.3 mg/dL (ref 8.9–10.3)
Chloride: 105 mmol/L (ref 98–111)
Creatinine, Ser: 0.59 mg/dL (ref 0.44–1.00)
GFR, Estimated: >60 mL/min (ref >60)
Glucose, Bld: 87 mg/dL (ref 70–99)
Potassium: 3.7 mmol/L (ref 3.5–5.1)
Sodium: 138 mmol/L (ref 135–145)
Total Bilirubin: 0.8 mg/dL (ref 0.3–1.2)
Total Protein: 6.6 g/dL (ref 6.5–8.1)

## 2021-04-05 LAB — CBG MONITORING, ED
Glucose-Capillary: 64 mg/dL — ABNORMAL LOW (ref 70–99)
Glucose-Capillary: 171 mg/dL — ABNORMAL HIGH (ref 70–99)

## 2021-04-05 MED ORDER — IOHEXOL 300 MG/ML  SOLN
100.0000 mL | Freq: Once | INTRAMUSCULAR | Status: AC | PRN
  Administered 2021-04-05: 17:00:00 100 mL via INTRAVENOUS

## 2021-04-05 MED ORDER — DEXTROSE 50 % IV SOLN
25.0000 g | Freq: Once | INTRAVENOUS | Status: AC
  Administered 2021-04-05: 17:00:00 25 g via INTRAVENOUS
  Filled 2021-04-05: qty 50

## 2021-04-05 NOTE — Discharge Instructions (Addendum)
Call your primary care doctor or specialist as discussed in the next 2-3 days.   Return immediately back to the ER if:  Your symptoms worsen within the next 12-24 hours. You develop new symptoms such as new fevers, persistent vomiting, new pain, shortness of breath, or new weakness or numbness, or if you have any other concerns.  

## 2021-04-05 NOTE — ED Triage Notes (Signed)
Fall with head injury this morning around 2AM, noted hematoma to head and black eye. Bilateral knee pain. Takes eliquis. Change in mental status per family, alert but more confused.

## 2021-04-05 NOTE — ED Provider Notes (Signed)
[place] EMERGENCY DEPT Provider Note   CSN: [id number] Arrival date & time: 04/05/21  1413     History Chief Complaint  Patient presents with   Breanna Webster is a 75 y.o. female.  Patient complaining of a fall, complaining of some left flank pain headache.  She states that she lost her balance and fell around 2 AM.  Family initially stated that she appeared confused, however upon my interview, they state that she is at baseline.  No reports of fevers or cough no vomiting or diarrhea.  Patient is on Eliquis for history of strokes in the past.      Past Medical History:  Diagnosis Date   Anxiety    Arthritis    "back" (04/22/2018)   Back pain    Blood transfusion without reported diagnosis    CAD (coronary artery disease)    a. 03/2018 s/p PCI/DES to the RCA (3.0x15 Onyx DES).   Carotid artery stenosis    Mild   Chest pain    Chronic lower back pain    Cirrhosis (HCC)    Colon polyps    Diverticulitis    Diverticulosis    Esophageal thickening    seen on pre TAVR CT scan, also questionable cirrhosis. MRI recommended. Will refer to GI   Fatty liver    GERD (gastroesophageal reflux disease)    Grave's disease    History of colonic polyps 05/22/2017   History of hiatal hernia    Hypertension    Hypothyroidism    IBS (irritable bowel syndrome)    Osteopenia    Pulmonary nodules    seen on pre TAVR CT. likley benign. no follow up recommended if pt low risk.   S/P TAVR (transcatheter aortic valve replacement)    Severe aortic stenosis    Shortness of breath on exertion    Stroke ([place])    Thalassemia minor    Thyroid disease    Type II diabetes mellitus ([place])     Patient Active Problem List   Diagnosis Date Noted   COVID-19 02/14/2021   Postviral fatigue syndrome 02/14/2021   Difficulty with adaptive servo-ventilation (ASV) use 02/14/2021   Sepsis secondary to UTI ([place]) 09/08/2020   Elevated ALT measurement 09/08/2020   History of  CVA with residual deficit 09/08/2020   Hyperbilirubinemia 09/08/2020   Fatigue associated with anemia 08/03/2020   Cerebrovascular accident (CVA) due to embolism of right posterior cerebral artery ([place]) 08/03/2020   OSA treated with BiPAP 08/03/2020   Complex sleep apnea syndrome 08/03/2020   Treatment-emergent central sleep apnea 08/03/2020   Chronic intermittent hypoxia with obstructive sleep apnea 04/21/2020   OSA (obstructive sleep apnea) 04/21/2020   History of cardioembolic stroke 44/04/270   Gait disturbance, post-stroke 03/29/2020   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 ([place]) 03/29/2020   Anxiety    RLQ abdominal pain 10/22/2019   Paroxysmal atrial fibrillation ([place]) 05/22/2019   Iron deficiency anemia 05/07/2019   Atrial fibrillation with RVR ([place]) 10/21/2018   Cerebellar stroke, acute ([place]) 10/21/2018   Streptococcal endocarditis    Endocarditis of mitral valve 10/07/2018   Bacteremia due to Streptococcus Salivarius 10/07/2018   Sepsis ([place]) 53/66/4403   Acute metabolic encephalopathy 47/42/5956   Severe aortic stenosis 07/08/2018   S/P TAVR (transcatheter aortic valve replacement) 07/08/2018   Esophageal thickening    CAD in native artery 04/22/2018   Gastroesophageal reflux disease    Pulmonary hypertension ([place]) 02/21/2018   Essential hypertension 07/15/2017  History of colonic polyps 05/22/2017   Elevated liver function tests 12/05/2016   DOE (dyspnea on exertion) 07/19/2016   Thalassemia minor 05/29/2016   Left bundle branch block 12/06/2015   Upper airway cough syndrome 21/19/4174   Diastolic heart failure ([place]) 10/10/2015   Myalgia 02/17/2014   Carotid artery stenosis 06/05/2013   Eustachian tube dysfunction 05/07/2013   Neuropathy of leg 03/07/2012   Anemia, unspecified 01/31/2012   Hot flashes 08/24/2010   Diabetes mellitus due to underlying condition, uncontrolled 06/30/2010   Generalized abdominal pain 03/24/2010   Obesity (BMI  30.0-34.9) 12/21/2009   IBS (irritable bowel syndrome) 04/29/2009   Vitamin D deficiency 03/10/2009   Acute bronchitis 03/07/2009   Hypothyroidism 12/13/2008   Dyslipidemia 12/13/2008   Anxiety state 12/13/2008   Other specified disorders of bladder 12/13/2008    Past Surgical History:  Procedure Laterality Date   24 HOUR [place] STUDY N/A 03/03/2018   Procedure: 24 HOUR PH STUDY;  Surgeon: Mauri Pole, MD;  Location: [facility];  Service: Endoscopy;  Laterality: N/A;   AORTIC VALVE REPLACEMENT  06/2018   COLONOSCOPY     COLONOSCOPY W/ BIOPSIES AND POLYPECTOMY     CORONARY ANGIOGRAPHY Right 04/21/2018   Procedure: CORONARY ANGIOGRAPHY (CATH LAB);  Surgeon: [name], MD;  Location: [facility];  Service: Cardiovascular;  Laterality: Right;   CORONARY STENT INTERVENTION N/A 04/22/2018   Procedure: CORONARY STENT INTERVENTION;  Surgeon: [name], MD;  Location: [facility];  Service: Cardiovascular;  Laterality: N/A;   DILATION AND CURETTAGE OF UTERUS     ESOPHAGEAL MANOMETRY N/A 03/03/2018   Procedure: ESOPHAGEAL MANOMETRY (EM);  Surgeon: Mauri Pole, MD;  Location: [facility];  Service: Endoscopy;  Laterality: N/A;   GASTRIC FUNDOPLICATION     HERNIA REPAIR     HYSTEROSCOPY     fibroids   LAPAROSCOPIC CHOLECYSTECTOMY     LAPAROSCOPY     fibroids   NISSEN FUNDOPLICATION  0814G   POLYPECTOMY     RIGHT/LEFT HEART CATH AND CORONARY ANGIOGRAPHY N/A 02/20/2018   Procedure: RIGHT/LEFT HEART CATH AND CORONARY ANGIOGRAPHY;  Surgeon: [name], MD;  Location: [facility];  Service: Cardiovascular;  Laterality: N/A;   TEE WITHOUT CARDIOVERSION N/A 07/08/2018   Procedure: TRANSESOPHAGEAL ECHOCARDIOGRAM (TEE);  Surgeon: Burnell Blanks, MD;  Location: [facility];  Service: Open Heart Surgery;  Laterality: N/A;   TEE WITHOUT CARDIOVERSION  10/07/2018   TEE WITHOUT CARDIOVERSION N/A 10/07/2018   Procedure: TRANSESOPHAGEAL ECHOCARDIOGRAM  (TEE);  Surgeon: Jerline Pain, MD;  Location: [facility];  Service: Cardiovascular;  Laterality: N/A;   TONSILLECTOMY     TRANSCATHETER AORTIC VALVE REPLACEMENT, TRANSFEMORAL N/A 07/08/2018   Procedure: TRANSCATHETER AORTIC VALVE REPLACEMENT, TRANSFEMORAL;  Surgeon: Burnell Blanks, MD;  Location: [facility];  Service: Open Heart Surgery;  Laterality: N/A;     OB History     Gravida  2   Para  2   Term  2   Preterm      AB      Living         SAB      IAB      Ectopic      Multiple      Live Births              Family History  Adopted: Yes  Problem Relation Age of Onset   Healthy Son        x 2  Headache Other        Cluster headaches   Heart failure Mother    Colon cancer Neg Hx    Pancreatic cancer Neg Hx    Rectal cancer Neg Hx    Stomach cancer Neg Hx     Social History   Tobacco Use   Smoking status: Never   Smokeless tobacco: Never  Vaping Use   Vaping Use: Never used  Substance Use Topics   Alcohol use: No    Alcohol/week: 0.0 standard drinks   Drug use: Never    Home Medications Prior to Admission medications   Medication Sig Start Date End Date Taking? Authorizing Provider  b complex vitamins capsule Take 1 capsule by mouth daily.    [provider]  BD INSULIN SYRINGE U/F 31G X 5/16" 1 ML MISC FOUR TIMES A DAY UNDER THE SKIN 07/27/20   [name], NP  BD PEN NEEDLE NANO 2ND GEN 32G X 4 MM MISC  11/26/20   [provider]  cholecalciferol (VITAMIN D) 25 MCG (1000 UNIT) tablet Take 1,000 Units by mouth daily.    [provider]  diltiazem (CARDIZEM CD) 120 MG 24 hr capsule TAKE ONE CAPSULE BY MOUTH DAILY 09/27/20   Nafziger, Tommi Rumps, NP  doxycycline (VIBRA-TABS) 100 MG tablet Take 1 tablet (100 mg total) by mouth 2 (two) times daily. Patient not taking: Reported on 04/03/2021 03/13/21   Martyn Ehrich, NP  ELIQUIS 5 MG TABS tablet TAKE ONE TABLET BY MOUTH TWICE A DAY 03/21/21   Nafziger, Tommi Rumps, NP   escitalopram (LEXAPRO) 20 MG tablet TAKE ONE TABLET BY MOUTH DAILY 03/24/21   Nafziger, Tommi Rumps, NP  estradiol (ESTRACE) 0.1 MG/GM vaginal cream Place vaginally. 01/23/21   [provider]  Lancets (ONETOUCH DELICA PLUS DTOIZT24P) MISC USE TO CHECK BLOOD SUGAR TWICE DAILY.. 02/09/19   Renato Shin, MD  levothyroxine (SYNTHROID) 137 MCG tablet Take 137 mcg by mouth once.    [provider]  losartan (COZAAR) 50 MG tablet Take 50 mg by mouth daily.    [provider]  metFORMIN (GLUCOPHAGE-XR) 500 MG 24 hr tablet Take 500 mg by mouth 2 (two) times daily. 07/18/20   [provider]  mirabegron ER (MYRBETRIQ) 50 MG TB24 tablet Take 50 mg by mouth daily.    [provider]  mirabegron ER (MYRBETRIQ) 50 MG TB24 tablet Take 1 tablet (50 mg total) by mouth daily. 04/03/21   Nafziger, Tommi Rumps, NP  Multiple Vitamin (MULITIVITAMIN WITH MINERALS) TABS Take 1 tablet by mouth daily.    [provider]  nitrofurantoin, macrocrystal-monohydrate, (MACROBID) 100 MG capsule Take 100 mg by mouth daily. Patient not taking: Reported on 04/03/2021 01/06/21   [provider]  nitroGLYCERIN (NITROSTAT) 0.4 MG SL tablet Place 1 tablet (0.4 mg total) under the tongue every 5 (five) minutes as needed for chest pain. 08/30/20   [name], MD  NOVOLIN N 100 UNIT/ML injection Inject 28 Units into the skin daily before breakfast.    [provider]  NOVOLIN R 100 UNIT/ML injection 8 Units 2 (two) times daily before a meal. 10 UNITS THIRTY MINUTES BEFORE MEALS.  5 ADDITIONAL UNITS WITH CARBS OR SNACKS.    [provider]  omeprazole (PRILOSEC) 20 MG capsule Take 1 capsule (20 mg total) by mouth 2 (two) times daily before a meal. Patient not taking: Reported on 03/21/2021 12/27/20   [name], [name], PA-C  OneTouch Delica Lancets 80D MISC Tests BS 2 times 08/16/20  [provider]  [facility] VERIO test strip USE TO MONITOR GLUCOSE LEVELS TWICE DAILY  12/29/18   Renato Shin, MD    Allergies    Statins  Review of Systems   Review of Systems  Constitutional:  Negative for fever.  HENT:  Negative for ear pain.   Eyes:  Negative for pain.  Respiratory:  Negative for cough.   Cardiovascular:  Negative for chest pain.  Gastrointestinal:  Positive for abdominal pain.  Genitourinary:  Negative for flank pain.  Musculoskeletal:  Negative for back pain.  Skin:  Negative for rash.  Neurological:  Negative for headaches.   Physical Exam Updated Vital Signs BP (!) 143/67 (BP Location: Right Arm)    Pulse 75    Temp 97.8 F (36.6 C) (Oral)    Resp 18    SpO2 92%   Physical Exam Constitutional:      General: She is not in acute distress.    Appearance: Normal appearance.  HENT:     Head: Normocephalic.     Nose: Nose normal.  Eyes:     Extraocular Movements: Extraocular movements intact.  Cardiovascular:     Rate and Rhythm: Normal rate.  Pulmonary:     Effort: Pulmonary effort is normal.  Abdominal:     Comments: Obese abdomen, tender in the left flank left lower quadrant region.  Musculoskeletal:        General: Normal range of motion.     Cervical back: Normal range of motion.     Comments: No C or T or L-spine midline step-offs or tenderness noted.  Patient ranging shoulders elbows wrists and knees hips and ankles without pain.  Skin:    Comments: Ecchymosis just above the left eye, ecchymosis of left flank.  No lacerations noted.  Neurological:     General: No focal deficit present.     Mental Status: She is alert. Mental status is at baseline.    ED Results / Procedures / Treatments   Labs (all labs ordered are listed, but only abnormal results are displayed) Labs Reviewed  CBC WITH DIFFERENTIAL/PLATELET - Abnormal; Notable for the following components:      Result Value   Hemoglobin 10.0 (*)    HCT 32.7 (*)    MCV 66.1 (*)    MCH 20.2 (*)    RDW 15.7 (*)    nRBC 0.3 (*)    All other components within normal  limits  CBG MONITORING, ED - Abnormal; Notable for the following components:   Glucose-Capillary 64 (*)    All other components within normal limits  CBG MONITORING, ED - Abnormal; Notable for the following components:   Glucose-Capillary 171 (*)    All other components within normal limits  COMPREHENSIVE METABOLIC PANEL    EKG None  Radiology CT Head Wo Contrast  Result Date: 04/05/2021 CLINICAL DATA:  Head trauma, minor (Age >= 65y) AC, mental status change; AC, mental status change EXAM: CT HEAD WITHOUT CONTRAST CT CERVICAL SPINE WITHOUT CONTRAST TECHNIQUE: Multidetector CT imaging of the head and cervical spine was performed following the standard protocol without intravenous contrast. Multiplanar CT image reconstructions of the cervical spine were also generated. COMPARISON:  MRI cervical spine 01/15/2020. FINDINGS: CT HEAD FINDINGS Brain: No evidence of acute large vascular territory infarct, acute hemorrhage, hydrocephalus, mass lesion, midline shift, or visible extra-axial fluid collection. Mild to moderate patchy white matter hypoattenuation, nonspecific but compatible with chronic microvascular ischemic disease. Remote appearing left cerebellar infarct. Mild for age scattered  white matter hypodensities, nonspecific but compatible with chronic microvascular disease. Mild for age atrophy. Vascular: Calcific intracranial atherosclerosis. No hyperdense vessel identified. Skull: Left forehead contusion without acute fracture. Sinuses/Orbits: Visualized sinuses are clear. Unremarkable visualized orbits. Other: No mastoid effusions. CT CERVICAL SPINE FINDINGS Alignment: Mild anterolisthesis C5 on C6, similar to the prior MRI. Mild broad dextrocurvature. Skull base and vertebrae: No evidence of acute fracture. Mild height loss of the anterior T1 vertebral body appears similar to the prior MRI cervical spine. Soft tissues and spinal canal: No prevertebral fluid or swelling. No visible canal  hematoma. Disc levels: Multilevel left greater than right facet arthropathy in the upper cervical spine. No evidence of significant bony canal stenosis. Upper chest: Visualized lung apices are clear. Other: Calcific atherosclerosis of the aorta, great vessel origins, and carotid bifurcations. IMPRESSION: CT head: 1. No evidence of acute intracranial abnormality. 2. Left forehead contusion without acute fracture. 3. Remote left cerebellar infarct and mild for age chronic microvascular ischemic disease and atrophy. CT cervical spine: 1. No evidence of acute fracture or traumatic malalignment. 2. Left greater than right upper cervical facet arthropathy. Electronically Signed   By: Margaretha Sheffield M.D.   On: 04/05/2021 17:25   CT Cervical Spine Wo Contrast  Result Date: 04/05/2021 CLINICAL DATA:  Head trauma, minor (Age >= 65y) AC, mental status change; AC, mental status change EXAM: CT HEAD WITHOUT CONTRAST CT CERVICAL SPINE WITHOUT CONTRAST TECHNIQUE: Multidetector CT imaging of the head and cervical spine was performed following the standard protocol without intravenous contrast. Multiplanar CT image reconstructions of the cervical spine were also generated. COMPARISON:  MRI cervical spine 01/15/2020. FINDINGS: CT HEAD FINDINGS Brain: No evidence of acute large vascular territory infarct, acute hemorrhage, hydrocephalus, mass lesion, midline shift, or visible extra-axial fluid collection. Mild to moderate patchy white matter hypoattenuation, nonspecific but compatible with chronic microvascular ischemic disease. Remote appearing left cerebellar infarct. Mild for age scattered white matter hypodensities, nonspecific but compatible with chronic microvascular disease. Mild for age atrophy. Vascular: Calcific intracranial atherosclerosis. No hyperdense vessel identified. Skull: Left forehead contusion without acute fracture. Sinuses/Orbits: Visualized sinuses are clear. Unremarkable visualized orbits. Other: No  mastoid effusions. CT CERVICAL SPINE FINDINGS Alignment: Mild anterolisthesis C5 on C6, similar to the prior MRI. Mild broad dextrocurvature. Skull base and vertebrae: No evidence of acute fracture. Mild height loss of the anterior T1 vertebral body appears similar to the prior MRI cervical spine. Soft tissues and spinal canal: No prevertebral fluid or swelling. No visible canal hematoma. Disc levels: Multilevel left greater than right facet arthropathy in the upper cervical spine. No evidence of significant bony canal stenosis. Upper chest: Visualized lung apices are clear. Other: Calcific atherosclerosis of the aorta, great vessel origins, and carotid bifurcations. IMPRESSION: CT head: 1. No evidence of acute intracranial abnormality. 2. Left forehead contusion without acute fracture. 3. Remote left cerebellar infarct and mild for age chronic microvascular ischemic disease and atrophy. CT cervical spine: 1. No evidence of acute fracture or traumatic malalignment. 2. Left greater than right upper cervical facet arthropathy. Electronically Signed   By: Margaretha Sheffield M.D.   On: 04/05/2021 17:25   CT Abdomen Pelvis W Contrast  Result Date: 04/05/2021 CLINICAL DATA:  [place] today. EXAM: CT ABDOMEN AND PELVIS WITH CONTRAST TECHNIQUE: Multidetector CT imaging of the abdomen and pelvis was performed using the standard protocol following bolus administration of intravenous contrast. CONTRAST:  119m OMNIPAQUE IOHEXOL 300 MG/ML  SOLN COMPARISON:  09/10/2020 FINDINGS: Lower chest: The lung bases are  clear of an acute process. No pleural effusion or pulmonary lesions. The heart is borderline enlarged but stable. Stable surgical changes from aortic valve replacement surgery and advanced vascular calcifications involving the aorta and coronary arteries. Hepatobiliary: The hepatic contour is irregular and the hepatic fissures are prominent. The caudate lobe is also slightly enlarged. Findings consistent with cirrhosis. No  hepatic lesions or evidence of acute hepatic injury. No perihepatic fluid collections. No intrahepatic biliary dilatation. The gallbladder is surgically absent. No common bile duct dilatation. Pancreas: Diffuse fatty change involving the pancreas. Spleen: Normal size.  No focal lesions. Adrenals/Urinary Tract: The adrenal glands are unremarkable. Bilateral renal cysts but no worrisome renal lesions or hydronephrosis. The bladder is unremarkable. Stomach/Bowel: Stomach, duodenum, small bowel and colon are grossly normal. No acute inflammatory changes, mass lesions or obstructive findings. The terminal ileum is normal. The appendix is normal. Descending and sigmoid colon diverticulosis without findings for acute diverticulitis. Vascular/Lymphatic: Advanced atherosclerotic calcifications involving the aorta and iliac arteries but no aneurysm or dissection. Small scattered mesenteric and retroperitoneal lymph nodes but no mass or overt adenopathy. There is a stable peritoneal nodule in the left abdomen on image 43/3 which measures 12 mm. No change. Reproductive: Stable small uterine fibroids. The ovaries are unremarkable. Other: No pelvic mass or adenopathy. No free pelvic fluid collections. No inguinal mass or adenopathy. No abdominal wall hernia or subcutaneous lesions. Musculoskeletal: No significant bony findings. The lumbar vertebral bodies are intact. The bony pelvis is intact. The pubic symphysis and SI joints are intact. IMPRESSION: 1. No acute abdominal/pelvic findings, mass lesions or adenopathy. 2. Stable 12 mm peritoneal nodule in the left abdomen. 3. Stable changes of cirrhosis. 4. Advanced atherosclerotic calcifications involving the aorta and iliac arteries. 5. Stable small uterine fibroids. 6. Status post cholecystectomy. No biliary dilatation. 7. Aortic atherosclerosis. Aortic Atherosclerosis (ICD10-I70.0). Electronically Signed   By: Marijo Sanes M.D.   On: 04/05/2021 17:52    Procedures Procedures    Medications Ordered in ED Medications  iohexol (OMNIPAQUE) 300 MG/ML solution 100 mL (100 mLs Intravenous Contrast Given 04/05/21 1651)  dextrose 50 % solution 25 g (25 g Intravenous Given 04/05/21 1721)    ED Course  I have reviewed the triage vital signs and the nursing notes.  Pertinent labs & imaging results that were available during my care of the patient were reviewed by me and considered in my medical decision making (see chart for details).    MDM Rules/Calculators/A&P                           Labs show anemia unchanged from prior chemistry remarkable glucose was low but given D50 here with improvement of blood sugar.  CT imaging shows no acute findings.  Patient advised continued outpatient follow-up with her doctor within the week.  Recommending immediate return for worsening symptoms fevers pain or any additional concerns.  Final Clinical Impression(s) / ED Diagnoses Final diagnoses:  Fall    Rx / DC Orders ED Discharge Orders     None        [place], Greggory Brandy, MD 04/05/21 [id number]

## 2021-04-05 NOTE — ED Notes (Signed)
Pt's CBG result was 64. Informed Dr. Almyra Free and Zenia Resides - RN.

## 2021-04-05 NOTE — Telephone Encounter (Signed)
Called pt but spouse stated that she is still sleeping. Will call pt back later.

## 2021-04-06 ENCOUNTER — Other Ambulatory Visit: Payer: Self-pay | Admitting: Adult Health

## 2021-04-06 ENCOUNTER — Encounter: Payer: Self-pay | Admitting: Hematology

## 2021-04-06 ENCOUNTER — Ambulatory Visit
Admission: RE | Admit: 2021-04-06 | Discharge: 2021-04-06 | Disposition: A | Discharge status: Home / Self Care | Payer: Medicare Other | Source: Ambulatory Visit | Attending: Obstetrics | Admitting: Obstetrics

## 2021-04-06 DIAGNOSIS — Z1231 Encounter for screening mammogram for malignant neoplasm of breast: Secondary | ICD-10-CM

## 2021-04-06 MED ORDER — AMITRIPTYLINE HCL 25 MG PO TABS
25.0000 mg | ORAL_TABLET | Freq: Every day | ORAL | 1 refills | Status: DC
  Filled 2021-04-28: qty 90, 90d supply, fill #0

## 2021-04-06 NOTE — Telephone Encounter (Signed)
Pt returned call and was advised of the message below. Per pt she wasn't asking if she should take it. Pt stated the reason she was calling is she is suppose to be taking it and needed a refill. Pt advised that the Rx was 0.65m of the Amitriptyline. Will route to PCP for advise.

## 2021-04-09 NOTE — Progress Notes (Signed)
Patient ID: Breanna Webster                 DOB: [birth year]                    MRN: [id number]     HPI: Breanna Webster is a 75 y.o. female patient referred to lipid clinic by Dr. Tamala Julian. 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   She was last seen by Dr. Tamala Julian 03/21/2021 for angina and was started on SL nitroglycerin and referred to lipid clinic for aggressive lipid management. The patient has tried a statin in the past, but had myalgias in her feet.   The patient arrives today with her husband, [name]. She recently suffered a fall on 12/14 and has a black eye and bruising on her body. She went to the freestanding ER at [facility] where she had CT scans done. She is concerned about internal bleeding and dislodgement of her aortic valve. I reviewed the CT scan which states that the aortic valve is stable and no bleeding was noted which was comforting to the patient.   We discussed her last lipid panel and the need to lower her cholesterol to prevent plaque buildup and prevent against heart attack and stroke. Her insurance coverage is through [facility] in [place] and it was discovered that her copays are 25% of the cost of the medications, which is why her Eliquis and Myrbetriq are so costly. We had a long discussion about the differences with Eliquis and warfarin and need for monitoring with warfarin and increased bleeding risk.   Current Medications: none Intolerances: atorvastatin 80 mg daily (muscle aches in feet) Risk Factors: known CAD, CVA, T2DM, HTN, HLD LDL goal: <55 mg/dL (per Dr. Tamala Julian)  Diet: lots of cheese  Exercise: can't really exercise due to her arthritis and  stroke  Family History: She was adopted. Her biological brother died of a heart attack at age 41.  Labs: 12/05/2020: TC 188, HDL 55, LDL 110, TG 113 10/22/2018: TC 128, HDL 24, LDL 85, TG 95  Past Medical History:  Diagnosis Date   Anxiety    Arthritis    "back" (04/22/2018)   Back pain    Blood transfusion without reported diagnosis    CAD (coronary artery disease)    a. 03/2018 s/p PCI/DES to the RCA (3.0x15 Onyx DES).   Carotid artery stenosis    Mild   Chest pain    Chronic lower back pain    Cirrhosis (HCC)    Colon polyps    Diverticulitis    Diverticulosis    Esophageal thickening    seen on pre TAVR CT scan, also questionable cirrhosis. MRI recommended. Will refer to GI   Fatty liver    GERD (gastroesophageal reflux disease)    Grave's disease    History of colonic polyps 05/22/2017   History of hiatal hernia    Hypertension    Hypothyroidism    IBS (irritable bowel syndrome)    Osteopenia    Pulmonary nodules    seen on pre TAVR CT. likley benign. no follow up recommended if pt low risk.   S/P TAVR (transcatheter aortic valve replacement)    Severe aortic stenosis  Shortness of breath on exertion    Stroke ([place])    Thalassemia minor    Thyroid disease    Type II diabetes mellitus ([place])     Current Outpatient Medications on File Prior to Visit  Medication Sig Dispense Refill   amitriptyline (ELAVIL) 25 MG tablet Take 1 tablet (25 mg total) by mouth at bedtime. 90 tablet 1   b complex vitamins capsule Take 1 capsule by mouth daily.     BD INSULIN SYRINGE U/F 31G X 5/16" 1 ML MISC FOUR TIMES A DAY UNDER THE SKIN 200 each 1   BD PEN NEEDLE NANO 2ND GEN 32G X 4 MM MISC      cholecalciferol (VITAMIN D) 25 MCG (1000 UNIT) tablet Take 1,000 Units by mouth daily.     diltiazem (CARDIZEM CD) 120 MG 24 hr capsule TAKE ONE CAPSULE BY MOUTH DAILY 90 capsule 1   ELIQUIS 5 MG TABS tablet TAKE ONE TABLET BY MOUTH TWICE A DAY 60 tablet 2   escitalopram (LEXAPRO) 20 MG  tablet TAKE ONE TABLET BY MOUTH DAILY 90 tablet 1   estradiol (ESTRACE) 0.1 MG/GM vaginal cream Place vaginally.     Lancets (ONETOUCH DELICA PLUS [id number]) MISC USE TO CHECK BLOOD SUGAR TWICE DAILY.. 200 each 0   levothyroxine (SYNTHROID) 137 MCG tablet Take 137 mcg by mouth once.     losartan (COZAAR) 50 MG tablet Take 50 mg by mouth daily.     metFORMIN (GLUCOPHAGE-XR) 500 MG 24 hr tablet Take 500 mg by mouth 2 (two) times daily.     mirabegron ER (MYRBETRIQ) 50 MG TB24 tablet Take 50 mg by mouth daily.     mirabegron ER (MYRBETRIQ) 50 MG TB24 tablet Take 1 tablet (50 mg total) by mouth daily. 21 tablet 0   Multiple Vitamin (MULITIVITAMIN WITH MINERALS) TABS Take 1 tablet by mouth daily.     nitroGLYCERIN (NITROSTAT) 0.4 MG SL tablet Place 1 tablet (0.4 mg total) under the tongue every 5 (five) minutes as needed for chest pain. 25 tablet 3   NOVOLIN N 100 UNIT/ML injection Inject 28 Units into the skin daily before breakfast.     NOVOLIN R 100 UNIT/ML injection 8 Units 2 (two) times daily before a meal. 10 UNITS THIRTY MINUTES BEFORE MEALS.  5 ADDITIONAL UNITS WITH CARBS OR SNACKS.     OneTouch Delica Lancets 58K MISC Tests BS 2 times     ONETOUCH VERIO test strip USE TO MONITOR GLUCOSE LEVELS TWICE DAILY 200 strip 3   No current facility-administered medications on file prior to visit.    Allergies  Allergen Reactions   Statins Other (See Comments)    Muscle aches    Assessment/Plan:  1. Hyperlipidemia - Last LDL was 110 which is above goal < 55. Patient is not currently on lipid lowering therapy and would benefit from a statin or PCSK9i. The patient had myalgias with prior atorvastatin use. We discussed her prior statin use and if she would be willing to trial another statin as well as PCSK9i. The patient elected to try for PCSK9i first, but is willing to trial another statin if needed. PCSK9i will most likely require PA. We also discussed the possibility of getting her approved for  the [place] to cover the cost of PCSK9i therapy. We discussed opting for lean meats such as chicken, [place], and seafood, and limiting red meat as well as increasing consumption of vegetables, whole grains, and fruits. Will submit PA for PCSK9i to her  insurance and apply for [employer] if PA approved. If denied will trial the patient on rosuvastatin 20 mg daily. If patient has myalgias with rosuvastatin will resubmit PA for PCSK9i. Given cardiac history, recommend checking Apo B lab next time labs are ordered. Encouraged diet changes and information given on heart healthy eating.   Patient seen with Marcelle Overlie, PharmD, BCPS, CPP.   Cathrine Muster, PharmD, [place] PGY2 Cardiology Pharmacy Resident 04/10/2021  3:56 PM

## 2021-04-10 ENCOUNTER — Other Ambulatory Visit: Payer: Self-pay

## 2021-04-10 ENCOUNTER — Encounter: Payer: Self-pay | Admitting: Hematology

## 2021-04-10 ENCOUNTER — Ambulatory Visit (INDEPENDENT_AMBULATORY_CARE_PROVIDER_SITE_OTHER): Payer: Medicare Other

## 2021-04-10 DIAGNOSIS — I639 Cerebral infarction, unspecified: Secondary | ICD-10-CM

## 2021-04-10 DIAGNOSIS — I2510 Atherosclerotic heart disease of native coronary artery without angina pectoris: Secondary | ICD-10-CM

## 2021-04-10 DIAGNOSIS — I10 Essential (primary) hypertension: Secondary | ICD-10-CM

## 2021-04-10 NOTE — Patient Instructions (Addendum)
Your last LDL was 110, our goal for you is < 55. I will work on submitting a prior authorization for [place] or [place]. I will call you once this approved and let you know if we need you to try another statin for approval. We will then work on getting you approved for the grant to cover the copay of this medication.  Please try to eat lean meets, veggies, fruits and whole grains and this will help lower your cholesterol.   Good resources:  Eat. Sleep. Move. Breath: The Beginners Guide to Living A Healthy Lifestyle  Eat, Drink, and Be Healthy: The [employer] Guide to [employer] [employer] (2007 version)  Zoe podcast  Tips for living a healthier life     Building a Healthy and Balanced Diet Make most of your meal vegetables and fruits -  of your plate. Aim for color and variety, and remember that potatoes dont count as vegetables on the Healthy Eating Plate because of their negative impact on blood sugar.  Go for whole grains -  of your plate. Whole and intact grains--whole wheat, barley, wheat berries, quinoa, oats, brown rice, and foods made with them, such as whole wheat pasta--have a milder effect on blood sugar and insulin than white bread, white rice, and other refined grains.  Protein power -  of your plate. Fish, poultry, beans, and nuts are all healthy, versatile protein sources--they can be mixed into salads, and pair well with vegetables on a plate. Limit red meat, and avoid processed meats such as bacon and sausage.  Healthy plant oils - in moderation. Choose healthy vegetable oils like olive, canola, soy, corn, sunflower, peanut, and others, and avoid partially hydrogenated oils, which contain unhealthy trans fats. Remember that low-fat does not mean healthy.  Drink water, coffee, or tea. Skip sugary drinks, limit milk and dairy products to one to two servings per day, and limit juice to a small glass per day.  Stay active. The red figure running across the  [place] is a reminder that staying active is also important in weight control.  The main message of the Healthy Eating Plate is to focus on diet quality:  The type of carbohydrate in the diet is more important than the amount of carbohydrate in the diet, because some sources of carbohydrate--like vegetables (other than potatoes), fruits, whole grains, and beans--are healthier than others. The Healthy Eating Plate also advises consumers to avoid sugary beverages, a major source of calories--usually with little nutritional value--in the American diet. The Healthy Eating Plate encourages consumers to use healthy oils, and it does not set a maximum on the percentage of calories people should get each day from healthy sources of fat. In this way, the Healthy Eating Plate recommends the opposite of the low-fat message promoted for decades by the USDA.  DeskDistributor.no  SUGAR  Sugar is a huge problem in the modern day diet. Sugar is a big contributor to heart disease, diabetes, high triglyceride levels, fatty liver disease and obesity. Sugar is hidden in almost all packaged foods/beverages. Added sugar is extra sugar that is added beyond what is naturally found and has no nutritional benefit for your body. The American Heart Association recommends limiting added sugars to no more than 25g for women and 36 grams for men per day. There are many names for sugar including maltose, sucrose (names ending in "ose"), high fructose corn syrup, molasses, cane sugar, corn sweetener, raw sugar, syrup, honey or fruit juice concentrate.  One of the best ways to limit your added sugars is to stop drinking sweetened beverages such as soda, sweet tea, and fruit juice.  There is 65g of added sugars in one 20oz bottle of Coke! That is equal to 7.5 donuts.   Pay attention and read all nutrition facts labels. Below is an examples of a nutrition facts  label. The #1 is showing you the total sugars where the # 2 is showing you the added sugars. This one serving has almost the max amount of added sugars per day!     20 oz Soda 65g Sugar = 7.5 Glazed Donuts  16oz Energy  Drink 54g Sugar = 6.5 Glazed Donuts  Large Sweet  Tea 38g Sugar = 4 Glazed Donuts  20oz Sports  Drink 34g Sugar = 3.5 Glazed Donuts  8oz Chocolate Milk 24g Sugar =2.5 Glazed Donuts  8oz Orange  Juice 21g Sugar = 2 Glazed Donuts  1 Juice Box 14g Sugar = 1.5 Glazed Donuts  16oz Water= NO SUGAR!!  EXERCISE  Exercise is good. Weve all heard that. In an ideal world, we would all have time and resources to get plenty of it. When you are active, your heart pumps more efficiently and you will feel better.  Multiple studies show that even walking regularly has benefits that include living a longer life. The American Heart Association recommends 150 minutes per week of exercise (30 minutes per day most days of the week). You can do this in any increment you wish. Nine or more 10-minute walks count. So does an hour-long exercise class. Break the time apart into what will work in your life. Some of the best things you can do include walking briskly, jogging, cycling or swimming laps. Not everyone is ready to exercise. Sometimes we need to start with just getting active. Here are some easy ways to be more active throughout the day:  Take the stairs instead of the elevator  Go for a 10-15 minute walk during your lunch break (find a friend to make it more enjoyable)  When shopping, park at the back of the parking lot  If you take public transportation, get off one stop early and walk the extra distance  Pace around while making phone calls  Check with your doctor if you arent sure what your limitations may be. Always remember to drink plenty of water when doing any type of exercise. Dont feel like a failure if youre not getting the 90-150 minutes per week. If you  started by being a couch potato, then just a 10-minute walk each day is a huge improvement. Start with little victories and work your way up.   HEALTHY EATING TIPS  When looking to improve your eating habits, whether to lose weight, lower blood pressure or just be healthier, it helps to know what a serving size is.   Grains 1 slice of bread,  bagel,  cup pasta or rice  Vegetables 1 cup fresh or raw vegetables,  cup cooked or canned Fruits 1 piece of medium sized fruit,  cup canned,   Meats/Proteins  cup dried       1 oz meat, 1 egg,  cup cooked beans, nuts or seeds  Dairy        Fats Individual yogurt container, 1 cup (8oz)    1 teaspoon margarine/butter or vegetable  milk or milk alternative, 1 slice of cheese          oil; 1 tablespoon mayonnaise or salad dressing  Plan ahead: make a menu of the meals for a week then create a grocery list to go with that menu. Consider meals that easily stretch into a night of leftovers, such as stews or casseroles. Or consider making two of your favorite meal and put one in the freezer for another night. Try a night or two each week that is meatless or no cook such as salads. When you get home from the grocery store wash and prepare your vegetables and fruits. Then when you need them they are ready to go.   Tips for going to the grocery store:  [place] store or generic brands  Check the weekly ad from your store on-line or in their in-store flyer  Look at the unit price on the shelf tag to compare/contrast the costs of different items  Buy fruits/vegetables in season  Carrots, bananas and apples are low-cost, naturally healthy items  If meats or frozen vegetables are on sale, buy some extras and put in your freezer  Limit buying prepared or ready to eat items, even if they are pre-made salads or fruit snacks  Do not shop when youre hungry  Foods at eye level tend to be more expensive. Look on the high and low shelves for  deals.  Consider shopping at the farmers market for fresh foods in season.  Avoid the cookie and chip aisles (these are expensive, high in calories and low in nutritional value). Shop on the outside of the grocery store.  Healthy food preparations:  If you cant get lean hamburger, be sure to drain the fat when cooking  Steam, saut (in olive oil), grill or bake foods  Experiment with different seasonings to avoid adding salt to your foods. Kosher salt, sea salt and Himalayan salt are all still salt and should be avoided. Try seasoning food with onion, garlic, thyme, rosemary, basil ect. Onion powder or garlic powder is ok. Avoid if it says salt (ie garlic salt).

## 2021-04-11 ENCOUNTER — Ambulatory Visit (INDEPENDENT_AMBULATORY_CARE_PROVIDER_SITE_OTHER): Payer: Medicare Other

## 2021-04-11 ENCOUNTER — Other Ambulatory Visit: Payer: Self-pay | Admitting: Obstetrics

## 2021-04-11 ENCOUNTER — Telehealth: Payer: Self-pay

## 2021-04-11 DIAGNOSIS — Z8673 Personal history of transient ischemic attack (TIA), and cerebral infarction without residual deficits: Secondary | ICD-10-CM

## 2021-04-11 DIAGNOSIS — Z794 Long term (current) use of insulin: Secondary | ICD-10-CM

## 2021-04-11 DIAGNOSIS — M8580 Other specified disorders of bone density and structure, unspecified site: Secondary | ICD-10-CM

## 2021-04-11 DIAGNOSIS — I480 Paroxysmal atrial fibrillation: Secondary | ICD-10-CM

## 2021-04-11 DIAGNOSIS — I2510 Atherosclerotic heart disease of native coronary artery without angina pectoris: Secondary | ICD-10-CM

## 2021-04-11 DIAGNOSIS — I10 Essential (primary) hypertension: Secondary | ICD-10-CM

## 2021-04-11 MED ORDER — REPATHA SURECLICK 140 MG/ML ~~LOC~~ SOAJ
1.0000 "pen " | SUBCUTANEOUS | 11 refills | Status: DC
  Filled 2021-04-28: qty 2, 28d supply, fill #0
  Filled 2021-05-09: qty 6, 84d supply, fill #0
  Filled 2021-08-04: qty 4, 56d supply, fill #1
  Filled 2021-09-20: qty 6, 84d supply, fill #2
  Filled 2022-02-09: qty 6, 84d supply, fill #3

## 2021-04-11 NOTE — Patient Instructions (Signed)
Visit Information  Thank you for taking time to visit with me today. Please don't hesitate to contact me if I can be of assistance to you before our next scheduled telephone appointment.  Following are the goals we discussed today:  Take all medications as prescribed Attend all scheduled provider appointments Call pharmacy for medication refills 3-7 days in advance of running out of medications Call provider office for new concerns or questions  keep appointment with eye doctor check blood sugar at prescribed times: twice daily and when you have symptoms of low or high blood sugar check pulse (heart) rate once a day make a plan to eat healthy take medicine as prescribed check blood pressure daily choose a place to take my blood pressure (home, clinic or office, retail store) call doctor for signs and symptoms of high blood pressure keep all doctor appointments eat more whole grains, fruits and vegetables, lean meats and healthy fats call for medicine refill 2 or 3 days before it runs out take all medications exactly as prescribed call doctor with any symptoms you believe are related to your medicine  Our next appointment is by telephone on 04/18/21 at 10:45 AM  Please call the care guide team at [phone] if you need to cancel or reschedule your appointment.   If you are experiencing a Mental Health or [place] or need someone to talk to, please call the Suicide and Crisis Lifeline: 988 call the [place] National Suicide Prevention Lifeline: (548)272-7063 or TTY: (430)819-9367 TTY 909 789 2131) to talk to a trained counselor call 1-800-273-TALK (toll free, 24 hour hotline) go to [facility] [address] [place] ([phone]) call 911   Patient verbalizes understanding of instructions provided today and agrees to view in [place].  Peter Garter RN, Jackquline Denmark, CDE Care Management Coordinator [facility] [phone], Mobile [phone]

## 2021-04-11 NOTE — Telephone Encounter (Signed)
Called patient and informed her prior authorization for [place] was approved by her insurance through 04/10/2022. Verified preferred pharmacy and sent medication to her pharmacy. Obtained information from patient to apply for [employer]. Instructed patient to wait to pick up medication until we call her to inform her of approval for the grant.   Breanna Webster, PharmD, [place] PGY2 Cardiology Pharmacy Resident 04/11/2021  11:01 AM

## 2021-04-11 NOTE — Chronic Care Management (AMB) (Signed)
Chronic Care Management   CCM RN Visit Note  04/11/2021 Name: Breanna Webster MRN: [id number] DOB: 11-Oct-1945  Subjective: Breanna Webster is a 75 y.o. year old female who is a primary care patient of [name], NP. The care management team was consulted for assistance with disease management and care coordination needs.    Engaged with patient by telephone for follow up visit in response to provider referral for case management and/or care coordination services.   Consent to Services:  The patient was given information about Chronic Care Management services, agreed to services, and gave verbal consent prior to initiation of services.  Please see initial visit note for detailed documentation.   Patient agreed to services and verbal consent obtained.   Assessment: Review of patient past medical history, allergies, medications, health status, including review of consultants reports, laboratory and other test data, was performed as part of comprehensive evaluation and provision of chronic care management services.   SDOH (Social Determinants of Health) assessments and interventions performed:    CCM Care Plan  Allergies  Allergen Reactions   Statins Other (See Comments)    Muscle aches    Outpatient Encounter Medications as of 04/11/2021  Medication Sig   amitriptyline (ELAVIL) 25 MG tablet Take 1 tablet (25 mg total) by mouth at bedtime.   b complex vitamins capsule Take 1 capsule by mouth daily.   BD INSULIN SYRINGE U/F 31G X 5/16" 1 ML MISC FOUR TIMES A DAY UNDER THE SKIN   BD PEN NEEDLE NANO 2ND GEN 32G X 4 MM MISC    cholecalciferol (VITAMIN D) 25 MCG (1000 UNIT) tablet Take 1,000 Units by mouth daily.   diltiazem (CARDIZEM CD) 120 MG 24 hr capsule TAKE ONE CAPSULE BY MOUTH DAILY   ELIQUIS 5 MG TABS tablet TAKE ONE TABLET BY MOUTH TWICE A DAY   escitalopram (LEXAPRO) 20 MG tablet TAKE ONE TABLET BY MOUTH DAILY   estradiol (ESTRACE) 0.1 MG/GM vaginal cream Place  vaginally.   Evolocumab (REPATHA SURECLICK) 347 MG/ML SOAJ Inject 1 pen into the skin every 14 (fourteen) days.   Lancets (ONETOUCH DELICA PLUS [id number]) MISC USE TO CHECK BLOOD SUGAR TWICE DAILY.Marland Kitchen   levothyroxine (SYNTHROID) 137 MCG tablet Take 137 mcg by mouth once.   losartan (COZAAR) 50 MG tablet Take 50 mg by mouth daily.   metFORMIN (GLUCOPHAGE-XR) 500 MG 24 hr tablet Take 500 mg by mouth 2 (two) times daily.   mirabegron ER (MYRBETRIQ) 50 MG TB24 tablet Take 50 mg by mouth daily.   mirabegron ER (MYRBETRIQ) 50 MG TB24 tablet Take 1 tablet (50 mg total) by mouth daily.   Multiple Vitamin (MULITIVITAMIN WITH MINERALS) TABS Take 1 tablet by mouth daily.   nitroGLYCERIN (NITROSTAT) 0.4 MG SL tablet Place 1 tablet (0.4 mg total) under the tongue every 5 (five) minutes as needed for chest pain.   NOVOLIN N 100 UNIT/ML injection Inject 28 Units into the skin daily before breakfast.   NOVOLIN R 100 UNIT/ML injection 8 Units 2 (two) times daily before a meal. 10 UNITS THIRTY MINUTES BEFORE MEALS.  5 ADDITIONAL UNITS WITH CARBS OR SNACKS.   OneTouch Delica Lancets 56E MISC Tests BS 2 times   ONETOUCH VERIO test strip USE TO MONITOR GLUCOSE LEVELS TWICE DAILY   No facility-administered encounter medications on file as of 04/11/2021.    Patient Active Problem List   Diagnosis Date Noted   COVID-19 02/14/2021   Postviral fatigue syndrome 02/14/2021   Difficulty with adaptive  servo-ventilation (ASV) use 02/14/2021   Sepsis secondary to UTI ([place]) 09/08/2020   Elevated ALT measurement 09/08/2020   History of CVA with residual deficit 09/08/2020   Hyperbilirubinemia 09/08/2020   Fatigue associated with anemia 08/03/2020   Cerebrovascular accident (CVA) due to embolism of right posterior cerebral artery ([place]) 08/03/2020   OSA treated with BiPAP 08/03/2020   Complex sleep apnea syndrome 08/03/2020   Treatment-emergent central sleep apnea 08/03/2020   Chronic intermittent hypoxia with obstructive  sleep apnea 04/21/2020   OSA (obstructive sleep apnea) 04/21/2020   History of cardioembolic stroke 41/28/7867   Gait disturbance, post-stroke 03/29/2020   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 ([place]) 03/29/2020   Anxiety    RLQ abdominal pain 10/22/2019   Paroxysmal atrial fibrillation ([place]) 05/22/2019   Iron deficiency anemia 05/07/2019   Atrial fibrillation with RVR ([place]) 10/21/2018   Cerebellar stroke, acute ([place]) 10/21/2018   Streptococcal endocarditis    Endocarditis of mitral valve 10/07/2018   Bacteremia due to Streptococcus Salivarius 10/07/2018   Sepsis ([place]) 67/20/9470   Acute metabolic encephalopathy 96/28/3662   Severe aortic stenosis 07/08/2018   S/P TAVR (transcatheter aortic valve replacement) 07/08/2018   Esophageal thickening    CAD in native artery 04/22/2018   Gastroesophageal reflux disease    Pulmonary hypertension ([place]) 02/21/2018   Essential hypertension 07/15/2017   History of colonic polyps 05/22/2017   Elevated liver function tests 12/05/2016   DOE (dyspnea on exertion) 07/19/2016   Thalassemia minor 05/29/2016   Left bundle branch block 12/06/2015   Upper airway cough syndrome 94/76/5465   Diastolic heart failure ([place]) 10/10/2015   Myalgia 02/17/2014   Carotid artery stenosis 06/05/2013   Eustachian tube dysfunction 05/07/2013   Neuropathy of leg 03/07/2012   Anemia, unspecified 01/31/2012   Hot flashes 08/24/2010   Diabetes mellitus due to underlying condition, uncontrolled 06/30/2010   Generalized abdominal pain 03/24/2010   Obesity (BMI 30.0-34.9) 12/21/2009   IBS (irritable bowel syndrome) 04/29/2009   Vitamin D deficiency 03/10/2009   Acute bronchitis 03/07/2009   Hypothyroidism 12/13/2008   Dyslipidemia 12/13/2008   Anxiety state 12/13/2008   Other specified disorders of bladder 12/13/2008    Conditions to be addressed/monitored:Atrial Fibrillation, CAD, HTN, HLD, DMII, and falls, hx CVA  Care Plan : RN Care Manager Plan  of Care  Updates made by [name], RN since 04/11/2021 12:00 AM     Problem: Chronic Disease Management and Care Coordination Needs (DM2, CAD, Afib,HTN. Hx CVA )   Priority: High     Long-Range Goal: Establish Plan of Care for Chronic Disease Management Needs (DM2, CAD, Afib,HTN. Hx CVA )   Start Date: 03/09/2021  Expected End Date: 09/05/2021  Recent Progress: On track  Priority: High  Note:   Current Barriers:  Care Coordination needs related to Financial constraints related to cost of medications Chronic Disease Management support and education needs related to Atrial Fibrillation, CAD, HTN, DMII, GERD, Overactive Bladder, and hx CVA Spoke with husband [employer].  States pt is currently sleeping.  States she has not been doing very well since she had a fall last week.  States she is very bruised and hurting.  States she continues to have chest pains and he is very worried about her.  States he plans to take her to the ED to have her checked out again.  RNCM Clinical Goal(s):  Patient will verbalize understanding of plan for management of Atrial Fibrillation, CAD, HTN, DMII, GERD, Overactive Bladder, and  hx CVA as evidenced by voiced adherence to plan of care verbalize basic understanding of  Atrial Fibrillation, CAD, HTN, DMII, GERD, Overactive Bladder, and hx CVA disease process and self health management plan as evidenced by voiced understanding and teach back take all medications exactly as prescribed and will call provider for medication related questions as evidenced by dispense report and pt verbalization attend all scheduled medical appointments: GI 04/26/21 Dr. Melvyn Novas 04/27/21, Neurology 05/18/21 as evidenced by GI 04/26/21 Dr. Melvyn Novas 04/27/21, Neurology 05/18/21 demonstrate Improved adherence to prescribed treatment plan for Atrial Fibrillation, CAD, HTN, DMII, GERD, Overactive Bladder, and hx CVA as evidenced by readings within limits, voiced adherence  to plan of care continue to work with RN Care Manager to address care management and care coordination needs related to  Atrial Fibrillation, CAD, HTN, DMII, GERD, Overactive Bladder, and hx CVA as evidenced by adherence to CM Team Scheduled appointments work with pharmacist to address Financial constraints related to cost of Myrbetriq and Eliquis related toAtrial Fibrillation, CAD, HTN, DMII, GERD, Overactive Bladder, and hx CVA as evidenced by review or EMR and patient or pharmacist report through collaboration with RN Care manager, provider, and care team.   Interventions: 1:1 collaboration with primary care provider regarding development and update of comprehensive plan of care as evidenced by provider attestation and co-signature Inter-disciplinary care team collaboration (see longitudinal plan of care) Evaluation of current treatment plan related to  self management and patient's adherence to plan as established by provider  Falls Interventions:  (Status:  New goal.) Long Term Goal Assessed for falls since last encounter Advised husband to take have pt checked out from fall and plans to take to ED today    Hypertension Interventions: (Status:  Goal on track:  Yes.) Long Term Goal Last practice recorded BP readings:  BP Readings from Last 3 Encounters:  03/02/21 118/60  12/28/20 116/74  12/27/20 126/60  Most recent eGFR/CrCl: No results found for: EGFR  No components found for: CRCL  Evaluation of current treatment plan related to hypertension self management and patient's adherence to plan as established by provider; Provided education to patient re: stroke prevention, s/s of heart attack and stroke; Discussed plans with patient for ongoing care management follow up and provided patient with direct contact information for care management team; Advised patient, providing education and rationale, to monitor blood pressure daily and record, calling PCP for findings outside established  parameters;   Urinary frequency Condition stable.  Not addressed this visit. Long Term Goal Evaluation of current treatment plan related to Overactive Bladder, Financial constraints related to cost of Myrbetric  self-management and patient's adherence to plan as established by provider. Discussed plans with patient for ongoing care management follow up and provided patient with direct contact information for care management team Evaluation of current treatment plan related to Urinary frequency and patient's adherence to plan as established by provider Discussed plans with patient for ongoing care management follow up and provided patient with direct contact information for care management team  Diabetes Interventions: (Status:  Goal on track:  Yes.) Long Term Goal Assessed patient's understanding of A1c goal: <7% Advised patient, providing education and rationale, to check cbg twice a day and record, calling provider for findings outside established parameters Review of patient status, including review of consultants reports, relevant laboratory and other test results, and medications completed Lab Results  Component Value Date   HGBA1C 7.4 (H) 09/08/2020  CAD Interventions: (Status:  Goal on track:  NO.) Long Term  Goal Assessed understanding of CAD diagnosis Medications reviewed including medications utilized in CAD treatment plan Provided education on importance of blood pressure control in management of CAD Provided education on Importance of limiting foods high in cholesterol Reviewed Importance of taking all medications as prescribed Reviewed Importance of attending all scheduled provider appointments Advised to have chest pain evaluated when she is at the ED   AFIB Interventions:  (Status:  Condition stable.  Not addressed this visit.) Long Term Goal   Counseled on increased risk of stroke due to Afib and benefits of anticoagulation for stroke prevention Counseled on bleeding risk  associated with Eliquis and importance of self-monitoring for signs/symptoms of bleeding Counseled on seeking medical attention after a head injury or if there is blood in the urine/stool  Patient Goals/Self-Care Activities: Take all medications as prescribed Attend all scheduled provider appointments Call pharmacy for medication refills 3-7 days in advance of running out of medications Call provider office for new concerns or questions  keep appointment with eye doctor check blood sugar at prescribed times: twice daily and when you have symptoms of low or high blood sugar check pulse (heart) rate once a day make a plan to eat healthy take medicine as prescribed check blood pressure daily choose a place to take my blood pressure (home, clinic or office, retail store) call doctor for signs and symptoms of high blood pressure keep all doctor appointments eat more whole grains, fruits and vegetables, lean meats and healthy fats call for medicine refill 2 or 3 days before it runs out take all medications exactly as prescribed call doctor with any symptoms you believe are related to your medicine Follow Up Plan:  Telephone follow up appointment with care management team member scheduled for:  04/11/21 The patient has been provided with contact information for the care management team and has been advised to call with any health related questions or concerns.         Plan:Telephone follow up appointment with care management team member scheduled for:  04/18/21 The patient has been provided with contact information for the care management team and has been advised to call with any health related questions or concerns.  Peter Garter RN, Jackquline Denmark, CDE Care Management Coordinator [facility] [phone], Mobile [phone]

## 2021-04-11 NOTE — Telephone Encounter (Signed)
[employer] grant approved  CARD NO. [id number]  BIN [id number]   PCN [id number]   PC GROUP [id number]

## 2021-04-11 NOTE — Telephone Encounter (Signed)
Called pharmacy grant information provided. Instructed pharmacy to bill as secondary insurance. Patient informed grant provided to pharmacy and she can pick up Rep[employer] when the prescription is ready and it should have $0 copay.   Cathrine Muster, PharmD, [place] PGY2 Cardiology Pharmacy Resident 04/11/2021  11:46 AM

## 2021-04-12 ENCOUNTER — Telehealth: Payer: Self-pay | Admitting: Family Medicine

## 2021-04-12 DIAGNOSIS — K5900 Constipation, unspecified: Secondary | ICD-10-CM | POA: Diagnosis not present

## 2021-04-12 DIAGNOSIS — R1900 Intra-abdominal and pelvic swelling, mass and lump, unspecified site: Secondary | ICD-10-CM | POA: Diagnosis not present

## 2021-04-12 NOTE — Telephone Encounter (Signed)
Patient has had similar complaints in the past and was just recently seen by Amy, NP. Recent ER eval with [facility] unremarkable. I can see her tomorrow in office if she wishes but the only thing I can offer her is physical therapy to work on balance (which she has previously done with great benefit).

## 2021-04-12 NOTE — Telephone Encounter (Signed)
Pt called stating that she has been constantly falling and hurting herself due to the strokes she's had and is wanting to discuss with the RN. Please advise.

## 2021-04-12 NOTE — Telephone Encounter (Signed)
Called back to further discuss. Reports she has been falling a lot. Fell on 04/05/21 and went to ED ([facility]). Had imaging completed. However, she fell again this morning. She was able to catch herself against the cabinet/did not hit head. Husband helped her up. She is leaning to the left and then falls. Does not use cane/walker. States she does not need one. Having severe head/chest pain post fall from 04/05/21. Unable to breath at night. Trying to get appt w/ pulmonologist and gastroenterologist right now.  Has PCP appt 04/14/21 at 1030a. Pt wanted appt w/ Dr. Brett Fairy but aware she does not have anything currently this week and will be off the next couple weeks. Ok to be scheduled w/ Jessica,NP for further evaluation/treatment. She is very concerned about her continued falls. Feels it is r/t her hx stroke.

## 2021-04-13 ENCOUNTER — Telehealth: Payer: Self-pay | Admitting: Interventional Cardiology

## 2021-04-13 ENCOUNTER — Encounter: Payer: Self-pay | Admitting: Adult Health

## 2021-04-13 ENCOUNTER — Ambulatory Visit (INDEPENDENT_AMBULATORY_CARE_PROVIDER_SITE_OTHER): Payer: Medicare Other | Admitting: Adult Health

## 2021-04-13 VITALS — BP 113/68 | HR 78 | Ht 65.0 in | Wt 177.0 lb

## 2021-04-13 DIAGNOSIS — R2689 Other abnormalities of gait and mobility: Secondary | ICD-10-CM | POA: Diagnosis not present

## 2021-04-13 DIAGNOSIS — I63431 Cerebral infarction due to embolism of right posterior cerebral artery: Secondary | ICD-10-CM | POA: Diagnosis not present

## 2021-04-13 DIAGNOSIS — I480 Paroxysmal atrial fibrillation: Secondary | ICD-10-CM | POA: Diagnosis not present

## 2021-04-13 DIAGNOSIS — M542 Cervicalgia: Secondary | ICD-10-CM | POA: Diagnosis not present

## 2021-04-13 NOTE — Telephone Encounter (Signed)
Pt c/o of Chest Pain: STAT if CP now or developed within 24 hours  1. Are you having CP right now? CHEST PAIN DUE TO FALL SINCE LAST WEEK  2. Are you experiencing any other symptoms (ex. SOB, nausea, vomiting, sweating)? SOB SINCE FALL LAST NIGHT  3. How long have you been experiencing CP? [place]  4. Is your CP continuous or coming and going?   5. Have you taken Nitroglycerin? NITO  ? PT WENT TO ED ONE WEEK AGO AND WAS ADVISED SHE ONLY HAS BRUISING.

## 2021-04-13 NOTE — Progress Notes (Signed)
[facility] [address] [place] [address] [phone]       OFFICE FOLLOW UP NOTE  [name] Date of Birth:  1945-05-16 Medical Record Number:  [id number]   Reason for Referral: stroke follow up    CHIEF COMPLAINT:  Chief Complaint  Patient presents with   Follow-up    RM 3 with spouse Breanna Webster  PT is well, here to follow up on recent fall.      HPI:  Update 04/13/2021 JM: Patient returns for acute visit due to increased falls and multiple other concerns.    Falls:  ED eval 12/14 post fall with c/o left flank pain and HA. CTH no acute finding. Lab work showed stable chronic anemia and hyperglycemia with improvement after D50. Fall occurred after "tripping over her feet" per husband. Per ED note, left orbital and left flank bruising. CT abdomen unremarkable. Reports fall 2 weeks ago where she lost her balance -felt like she was being pulled towards the left.  Denies associated dizziness at that time but does have chronic intermittent dizziness. Imbalance present since her stroke. Does admit to limited physical activity or exercise due to fear of falling - she reports she keeps busy going to different doctors appointments. She does report chronic b/l knee and lower back pain.  Chronic imbalance from stroke 2.5 years ago. She is concerned that this has worsened over the past 2 weeks and very concerned about possible new stroke. Denies any increased stressors or new medications.   Left Lower Breast Pain:  complaining of sharp left breast pain which started after fall last week. It is constant but worse with coughing, sneezing, lying down and with deep breaths. Radiates into back and right midline anterior. CT abdomen obtained at ED visit which was unremarkable. She is trying to schedule an appt with her cardiologist. Has appt with PCP tomorrow morning.   Transient right eye vision loss:  chronic issue. Prior extensive work-up unremarkable.  Plans on following up with Dr. Katy Fitch for further evaluation  Left hand tremor, worsening:  present since prior stroke. Feels like this is worsening as it is "moving up my arm" where previously only in hand. Does not interfere with daily activity or functioning.    She reports compliance with Eliquis 5 mg twice daily. Plans on starting Repatha due to continued elevated LDL despite statin usage.  Both Eliquis and Repatha management cardiology. Followed by PCP for DM management - prior A1c 7.4. noted hypoglycemia during recent ED visit - denies this occurring at home.    No further concerns at this time      History provided for reference purposes only Update 03/14/2021 AL: she called yesterday reporting a 5-15 minute episode of severe dizziness. Symptoms resolved spontaneously. She reports having a similar event 2-3 weeks ago of sudden dizziness. She reports not being able to stand up due to dizziness. She states that she was dry heaving. She was very nauseous. Symptoms lasted about 5 minutes. She also reports having "black out of right eye vision" that lasts 20 seconds twice over the past 2-3 weeks. Not associated with dizziness or headache. She has had a visual disturbance (husband states she has not mentioned this to him) a couple of times over the past few days where she sees a zig zag line in the right eye. Again, no headache or dizziness associated with visual disturbance. She had a similar event of right sided vision loss in 03/2020. CTA head and neck  unremarkable for acute process. Right paratracheal nodule noted. She has seen optometry but not ophthalmologist. She does not drink much water due to overactive bladder. She drinks coffee in the mornings and usually tea at night.    She continues Eliquis, diltiazem, losartan. BP was 148/71 at beginning of visit, repeated manually 15 minutes later and was 140/60. Orthostatics 125/74 P 77 lying, 119/66 P 78 with standing and patient reported  dizziness, 115/62 P 83 standing (less dizzy) and 115/58 P 80 after three minutes standing (less dizzy). She reports allergy to statin therapy. She has follow up echo with cardiology in 2 weeks. She was started on abx and steroids yesterday following visit with pulmonology for productive cough. BP was 112/72 at that visit.    Update 06/22/2020 JM: Breanna Webster returns for stroke follow-up accompanied by her husband.  Reports continued mild left hand tremor with mild left hand weakness -stable without worsening Prior complaints of gait unsteadiness -reports great improvement after working with PT -she has been able to increase ambulation distance - per husband, able to walk 3-4 blocks without stopping where previously difficulty ambulating very short distances She also reports occasional dizziness typically with position changes that last short duration  She does report in December, she presented to the ED with acute onset right eye visual loss which lasted approximately 15 minutes and then completely resolved.  CTA head/neck unremarkable. Reports evaluation by ophthalmology with no concerning findings that may have possibly contributed.  She has not had any additional visual loss or visual symptoms  Reports compliance on Eliquis 5 mg twice daily -denies bleeding or bruising Glucose levels stable with recent A1c 6.5 (04/2020) Routinely follows with cardiology, PCP and endocrinology  Due to concerns of continued excessive daytime fatigue, she was referred to [facility] for possible repeat sleep study as prior sleep study in 08/2018 was negative for sleep apnea.  Evaluated by Dr. Brett Fairy and underwent sleep study on 03/29/2020 which showed AHI 12.8 with REM sleep associated with continuous low oxygen saturation with total O2 desat time 70 minutes with SPO2 nadir 76% and mild PLM.  Underwent CPAP titration 04/21/2020 with apnea unresponsive to hypopneas, apnea and desaturations but OSA responded  partially to BiPAP 14/10 with central sleep apnea present with higher BiPAP pressures, sleep-related hypoxemia improved greatly on CPAP and BiPAP, presence of mild PLM confirmed.  Recommended initiating auto BiPAP which she started on 2/9.  She appears to be tolerating without difficulty but concerned as she continues to feel daytime fatigue.  No further concerns at this time   Update 12/21/2019 JM: Breanna Webster returns for stroke follow-up.  She is accompanied by her husband. Residual deficits of left hand tremor, unsteadiness and short-term memory impairment.  She will have occasional dizziness but typically upon standing from seated position.  Denies any reoccurring diplopia Prior concern of worsening diplopia, imbalance, dizziness and left hand shaking therefore obtain MRI which was negative for acute abnormality She was referred to PT at prior visit but for unknown reason, they were not called to schedule initial evaluation.  She is interested in pursuing PT Denies new stroke/TIA symptoms Remains on Eliquis 5 mg twice daily without bleeding or bruising for secondary stroke prevention and history of atrial fibrillation Blood pressure today 128/64 Glucose levels stable per patient HTN, HLD and DM currently managed by PCP She has multiple other complaints today: She also reports excessive daytime fatigue, insomnia, occasional nocturia and snoring Underwent sleep study approximately 1 year ago which showed  mild sleep apnea with AHI 5.0.  Does report worsening daytime fatigue and insomnia and she continues to question accuracy of sleep study results She is currently being evaluated by neurosurgery for right underarm and chest numbness/tingling with concerns of thoracic involvement.  Plans on obtaining MRI cervical and thoracic scheduled on 01/15/2020 No further concerns at this time  Update 09/14/2019 JM: Breanna Webster is a 75 year old female with underlying medical history of DVT history, aortic  stenosis s/p TAVR 06/2018, hypothyroidism, pulmonary hypertension, HTN, DM, CAD s/p stenting 03/2018, hx of infective endocarditis 09/2018 and multifocal bilateral anterior posterior infarcts secondary to new onset A. fib in 09/2018.  Patient requests appointment today with concern for intermittent double vision, imbalance, dizziness and left hand shaking.  She is accompanied by her husband.  Previously seen roughly 6 months ago stable from a stroke standpoint and advised to follow-up as needed.  Prior complaints of intermittent diplopia, left hemiparesis and imbalance secondary to multiple cardioembolic strokes throughout the supratentorial brain, brainstem and cerebellum including multiple lesions within the corpus callosum.  She believes she continued to have intermittent symptoms but believes over the past few months symptoms have worsened or become more frequent.  she did have ophthalmology evaluation on 09/07/2019 with concern of bilateral diplopia worsened with visual tasks with saccadic changes.  Experiences worsening dizziness or diplopia while watching TV for too long.  Also experience dizziness if standing too quickly or rapid head movements.  Symptoms only last for 10 to 20 seconds and then resolve.  Episodes occur 1-2 times per week.  Denies headache, migraine, worsening weakness, numbness/tingling, speech impairment or confusion.  She endorses ongoing compliance with all prescribed medications including Eliquis 5 mg twice daily.  Blood pressure monitored at home which has been stable with today's reading 125/75.  Underlying mild cognitive impairment post stroke which has been stable without worsening.  She questions clearance for driving.  Continues to follow with PCP for HTN, HLD and DM management.  No further concerns at this time.  Update 03/12/2019 JM: Breanna Webster is a 75 year old female who is being seen today for stroke follow-up accompanied by her husband.  She continues to experience balance  difficulties, mild left hemiparesis and mild cognitive impairment without much improvement.  Her greatest concern today is ongoing shortness of breath which has been present since the beginning of this year.  Her shortness of breath severely limits her daily functioning and activity.  Recently seen by Dr. Tamala Julian, her cardiologist, who felt continued shortness of breath was multifactorial but highly encouraged increasing activity and possibly cardiac rehab.  She feels as though "something is being missed" and is overly frustrated.  She also continues to experience excessive daytime fatigue and insomnia at night.  Prior sleep study negative for OSA.  She continues on Eliquis and aspirin without bleeding or bruising.  Blood pressure today satisfactory at 128/78.  Continues on home Lexapro 20 mg daily and feels as though depression and anxiety stable.  She also continues on amitriptyline 25 mg nightly for prior concerns with headache.  She denies any reoccurring headaches at this time.  No further concerns at this time.  Initial visit 12/10/2018 JM: Residual deficits of mild left hemiparesis, mild left facial droop with occasional speech difficulty, balance difficulties, decreased vision and short-term memory concerns.  She does have difficulty ambulating at times stating her legs feel "rubbery" typically worse worsening in the morning and in the evening.  She is able to ambulate without assistive device.  Prior  complaints of left-sided vision with sensation of a shade pulled over her left eye but this is since resolved and recently obtained new prescription glasses by ophthalmology.  She also endorses short-term memory concerns such as forgetting recent question/answer or her husband having to continuously repeat himself.  She denies improvement but her husband states he has seen improvement of her overall ambulation and memory concerns.  She has not received any therapy as this was not recommended at discharge.  She  is questioning possible participation in therapy. Previously experiencing left-sided severe headache with recent cessation with continued use of  amitriptyline 25 mg nightly and gabapentin 300 mg nightly.  It was recommended to discontinue gabapentin 300 mg nightly by PCP as she did not gain benefit but she is not aware of this and has continued.  Symptoms previously located left occipital radiating to frontal region with short duration sharp stabbing radiating pain which appears to be secondary to possible occipital neuralgia. Previously underwent sleep study for possible OSA which was negative Continues on Eliquis and aspirin 81 mg without bleeding or bruising Continues to follow with PCP for DM management Blood pressure today 107/60 States depression/anxiety well controlled with continuation of Lexapro No further concerns at this time  Stroke admission 10/21/2018: Breanna Webster is a 75 y.o. female with history of DVT, aortic stenosis s/p TAVR in March 2020, hypothyroidism, pulmonary hypertension, HTN, CADs/p stenting December 2019 recently admitted for infective endocarditis on the mitral valve treated with 2 weeks gentamicin and ceftriaxone via PEG who presented on 10/21/2018 to the hospital with increased shortness of breath and confusion x2 weeks along with headache.  No focal neurologic symptoms.  New A. Fib with RVR found in the ED and started on heparin drip.  Neurology consulted with work-up revealing multifocal bilateral anterior and posterior infarcts cardioembolic pattern as evidenced on MRI secondary to new onset A. fib versus recent endocarditis.  MRI showed diffuse scattered subcentimeter foci of reduced diffusion and intermediate diffusion throughout the supratentorial brain, brainstem and cerebellum including multiple lesions within the corpus callosum.  Vessel imaging unremarkable with only mild arthrosclerotic bilateral ICA bifurcations.  2D echo normal EF without mention of  vegetation.  Recommended initiating D [place] with new onset of A. fib.  HTN stable.  LDL 85 and due to history of statin allergy with myalgias, statin initiation was not recommended.  A1c 7.5 and recommended follow-up with PCP for uncontrolled DM.  Other stroke risk factors include advanced age, obesity, CAD, MVR and status post TAVR 06/2018.  She was discharged home in stable condition without therapy.  She returned to ED on 11/17/2018 with complaints of sudden onset left-sided headache located left parietal occipital area at the base of her neck.  CTA unremarkable for dissection or other abnormality.  She did receive 2 migraine cocktails with improvement of headache symptoms.  She has since followed up with her PCP who initiated gabapentin along with continuation of nortriptyline 50 mg nightly.  She apparently did not gain benefit from gabapentin therefore recommended discontinuing gabapentin and nortriptyline by PCP and initiated on amitriptyline.  She did have follow-up with ophthalmologist with concerns of occipital neuralgia.        ROS:   14 system review of systems performed and negative with exception of see HPI  PMH:  Past Medical History:  Diagnosis Date   Anxiety    Arthritis    "back" (04/22/2018)   Back pain    Blood transfusion without reported diagnosis  CAD (coronary artery disease)    a. 03/2018 s/p PCI/DES to the RCA (3.0x15 Onyx DES).   Carotid artery stenosis    Mild   Chest pain    Chronic lower back pain    Cirrhosis (HCC)    Colon polyps    Diverticulitis    Diverticulosis    Esophageal thickening    seen on pre TAVR CT scan, also questionable cirrhosis. MRI recommended. Will refer to GI   Fatty liver    GERD (gastroesophageal reflux disease)    Grave's disease    History of colonic polyps 05/22/2017   History of hiatal hernia    Hypertension    Hypothyroidism    IBS (irritable bowel syndrome)    Osteopenia    Pulmonary nodules    seen on pre TAVR CT.  likley benign. no follow up recommended if pt low risk.   S/P TAVR (transcatheter aortic valve replacement)    Severe aortic stenosis    Shortness of breath on exertion    Stroke (HCC)    Thalassemia minor    Thyroid disease    Type II diabetes mellitus (HCC)     PSH:  Past Surgical History:  Procedure Laterality Date   21 HOUR [place] STUDY N/A 03/03/2018   Procedure: 24 HOUR PH STUDY;  Surgeon: Mauri Pole, MD;  Location: [facility];  Service: Endoscopy;  Laterality: N/A;   AORTIC VALVE REPLACEMENT  06/2018   COLONOSCOPY     COLONOSCOPY W/ BIOPSIES AND POLYPECTOMY     CORONARY ANGIOGRAPHY Right 04/21/2018   Procedure: CORONARY ANGIOGRAPHY (CATH LAB);  Surgeon: [name], MD;  Location: [facility];  Service: Cardiovascular;  Laterality: Right;   CORONARY STENT INTERVENTION N/A 04/22/2018   Procedure: CORONARY STENT INTERVENTION;  Surgeon: [name], MD;  Location: [facility];  Service: Cardiovascular;  Laterality: N/A;   DILATION AND CURETTAGE OF UTERUS     ESOPHAGEAL MANOMETRY N/A 03/03/2018   Procedure: ESOPHAGEAL MANOMETRY (EM);  Surgeon: Mauri Pole, MD;  Location: [facility];  Service: Endoscopy;  Laterality: N/A;   GASTRIC FUNDOPLICATION     HERNIA REPAIR     HYSTEROSCOPY     fibroids   LAPAROSCOPIC CHOLECYSTECTOMY     LAPAROSCOPY     fibroids   NISSEN FUNDOPLICATION  4742V   POLYPECTOMY     RIGHT/LEFT HEART CATH AND CORONARY ANGIOGRAPHY N/A 02/20/2018   Procedure: RIGHT/LEFT HEART CATH AND CORONARY ANGIOGRAPHY;  Surgeon: [name], MD;  Location: [facility];  Service: Cardiovascular;  Laterality: N/A;   TEE WITHOUT CARDIOVERSION N/A 07/08/2018   Procedure: TRANSESOPHAGEAL ECHOCARDIOGRAM (TEE);  Surgeon: Burnell Blanks, MD;  Location: [facility];  Service: Open Heart Surgery;  Laterality: N/A;   TEE WITHOUT CARDIOVERSION  10/07/2018   TEE WITHOUT CARDIOVERSION N/A 10/07/2018   Procedure: TRANSESOPHAGEAL  ECHOCARDIOGRAM (TEE);  Surgeon: Jerline Pain, MD;  Location: [facility] END[facility];  Service: Cardiovascular;  Laterality: N/A;   TONSILLECTOMY     TRANSCATHETER AORTIC VALVE REPLACEMENT, TRANSFEMORAL N/A 07/08/2018   Procedure: TRANSCATHETER AORTIC VALVE REPLACEMENT, TRANSFEMORAL;  Surgeon: Burnell Blanks, MD;  Location: [facility];  Service: Open Heart Surgery;  Laterality: N/A;    Social History:  Social History   Socioeconomic History   Marital status: Married    Spouse name: Not on file   Number of children: 2   Years of education: Not on file   Highest education level: Not on file  Occupational History  Occupation: Tree surgeon of the [employer]  Tobacco Use   Smoking status: Never   Smokeless tobacco: Never  Vaping Use   Vaping Use: Never used  Substance and Sexual Activity   Alcohol use: No    Alcohol/week: 0.0 standard drinks   Drug use: Never   Sexual activity: Not Currently  Other Topics Concern   Not on file  Social History Narrative   Lives with husband in a one story home.     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   Education: college.   Social Determinants of Health   Financial Resource Strain: Low Risk    Difficulty of Paying Living Expenses: Not hard at all  Food Insecurity: No Food Insecurity   Worried About Charity fundraiser in the Last Year: Never true   [place] in the Last Year: Never true  Transportation Needs: No Transportation Needs   Lack of Transportation (Medical): No   Lack of Transportation (Non-Medical): No  Physical Activity: Inactive   Days of Exercise per Week: 0 days   Minutes of Exercise per Session: 0 min  Stress: No Stress Concern Present   Feeling of Stress : Not at all  Social Connections: Socially Integrated   Frequency of Communication with Friends and Family: More than three times a week   Frequency of Social Gatherings with Friends  and Family: More than three times a week   Attends Religious Services: More than 4 times per year   Active Member of [employer] or Organizations: Yes   Attends Music therapist: More than 4 times per year   Marital Status: Married  Human resources officer Violence: Not At Risk   Fear of Current or Ex-Partner: No   Emotionally Abused: No   Physically Abused: No   Sexually Abused: No    Family History:  Family History  Adopted: Yes  Problem Relation Age of Onset   Healthy Son        x 2   Headache Other        Cluster headaches   Heart failure Mother    Colon cancer Neg Hx    Pancreatic cancer Neg Hx    Rectal cancer Neg Hx    Stomach cancer Neg Hx     Medications:   Current Outpatient Medications on File Prior to Visit  Medication Sig Dispense Refill   amitriptyline (ELAVIL) 25 MG tablet Take 1 tablet (25 mg total) by mouth at bedtime. 90 tablet 1   b complex vitamins capsule Take 1 capsule by mouth daily.     BD INSULIN SYRINGE U/F 31G X 5/16" 1 ML MISC FOUR TIMES A DAY UNDER THE SKIN 200 each 1   BD PEN NEEDLE NANO 2ND GEN 32G X 4 MM MISC      cholecalciferol (VITAMIN D) 25 MCG (1000 UNIT) tablet Take 1,000 Units by mouth daily.     diltiazem (CARDIZEM CD) 120 MG 24 hr capsule TAKE ONE CAPSULE BY MOUTH DAILY 90 capsule 1   ELIQUIS 5 MG TABS tablet TAKE ONE TABLET BY MOUTH TWICE A DAY 60 tablet 2   escitalopram (LEXAPRO) 20 MG tablet TAKE ONE TABLET BY MOUTH DAILY 90 tablet 1   estradiol (ESTRACE) 0.1 MG/GM vaginal cream Place vaginally.     Evolocumab (REPATHA SURECLICK) 001 MG/ML SOAJ Inject 1 pen into the skin every 14 (fourteen) days. 2 mL 11   Lancets (  ONETOUCH DELICA PLUS [id number]) MISC USE TO CHECK BLOOD SUGAR TWICE DAILY.. 200 each 0   levothyroxine (SYNTHROID) 137 MCG tablet Take 137 mcg by mouth once.     losartan (COZAAR) 50 MG tablet Take 50 mg by mouth daily.     metFORMIN (GLUCOPHAGE-XR) 500 MG 24 hr tablet Take 500 mg by mouth 2 (two) times daily.      mirabegron ER (MYRBETRIQ) 50 MG TB24 tablet Take 50 mg by mouth daily.     mirabegron ER (MYRBETRIQ) 50 MG TB24 tablet Take 1 tablet (50 mg total) by mouth daily. 21 tablet 0   Multiple Vitamin (MULITIVITAMIN WITH MINERALS) TABS Take 1 tablet by mouth daily.     nitroGLYCERIN (NITROSTAT) 0.4 MG SL tablet Place 1 tablet (0.4 mg total) under the tongue every 5 (five) minutes as needed for chest pain. 25 tablet 3   NOVOLIN N 100 UNIT/ML injection Inject 28 Units into the skin daily before breakfast.     NOVOLIN R 100 UNIT/ML injection 8 Units 2 (two) times daily before a meal. 10 UNITS THIRTY MINUTES BEFORE MEALS.  5 ADDITIONAL UNITS WITH CARBS OR SNACKS.     OneTouch Delica Lancets 32T MISC Tests BS 2 times     ONETOUCH VERIO test strip USE TO MONITOR GLUCOSE LEVELS TWICE DAILY 200 strip 3   No current facility-administered medications on file prior to visit.    Allergies:   Allergies  Allergen Reactions   Statins Other (See Comments)    Muscle aches     Physical Exam  Vitals:   04/13/21 1335  BP: 113/68  Pulse: 78  Weight: 177 lb (80.3 kg)  Height: 5' 5"  (1.651 m)   Body mass index is 29.45 kg/m. No results found.  General: well developed, well nourished, pleasant elderly Caucasian female, seated, mildly anxious Head: head normocephalic and atraumatic.   Neck: supple with no carotid or supraclavicular bruits Cardiovascular: regular rate and rhythm, no murmurs Musculoskeletal: no deformity; pain with pressure over left lateral rib area Skin:  no rash/petichiae; ecchymosis left orbital and flank area Vascular:  Normal pulses all extremities   Neurologic Exam Mental Status: Awake and fully alert. Fluent speech and language. Oriented to place and time. Recent memory mildly impaired and remote memory intact.  Attention span, concentration and fund of knowledge mostly appropriate. Mood and affect appropriate.  Cranial Nerves: Pupils equal, briskly reactive to light. Extraocular  movements full without nystagmus although mild OS convergence insufficiency.  Visual fields full to confrontation. Hearing intact. Facial sensation intact.  Mild left lower facial weakness - chronic  Motor: Normal bulk and tone.  Full strength in all tested extremities except slight decreased left hand dexterity and mild tremor with outstretched arm Sensory.: intact to touch , pinprick , position and vibratory sensation.  Coordination: Rapid alternating movements normal in all extremities except slightly decreased left hand. Finger-to-nose showed slight left hand ataxia and heel-to-shin performed accurately bilaterally.  Gait and Station: Arises from chair with mild difficulty. Stance is normal. Gait demonstrates slow cautious steps with mild imbalance and generalized mild swaying from side to side.  Tandem walk and heel toe not attempted.  Romberg negative. Reflexes: 1+ and symmetric. Toes downgoing.       ASSESSMENT: ARMIDA Webster is a 76 y.o. year old female presented with increased shortness of breath and confusion x2 weeks along with headache on 10/21/2018 with recent admission on 10/03/2018 for sepsis and mitral valve endocarditis.  Stroke work-up revealed multifocal bilateral anterior  and posterior infarcts cardioembolic pattern secondary to new onset A. fib versus recent endocarditis. Vascular risk factors include history of DVT, aortic stenosis status post TAVR 06/2018, pulmonary hypertension, HTN, DM, CAD status post stenting 03/2018, endocarditis, and A. Fib on Eliquis. Prior dx of sleep apnea but on recent sleep study no evidence of sleep apnea    PLAN:  Increased falls Unknown exact etiology but suspicion of multifactorial with history of prior stroke, chronic low back and b/l knee pain, and sedentary lifestyle.  Seems to be mixed of mechanical falls and imbalance which is worsening per patient report. [place] 12/14 negative. Will obtain MR brain and MR cervical due to worsening gait.   Recommend aquatic therapy for imbalance with underlying chronic b/l knee and lower back pain - husband will call next week with preferred location Left lower breast pain: seems musculoskeletal in etiology from recent fall based on subjective and objective findings.  Reassured her CT abdomen completed 12/14 was unremarkable.  Low suspicion for cardiac etiology but did advise if symptoms worsen to proceed to ED for more emergent evaluation.  Hx multiple strokes including cerebellar and pontine stroke:  Residual deficits: mild LUE decreased dexterity with intermittent tremor and cognitive impairment.  Continue Eliquis (apixaban) daily and start Repatha for secondary stroke prevention.   Discussed secondary stroke prevention measures and importance of close PCP/endocrinology follow-up for aggressive stroke risk factor management including HTN with BP goal<130/90 and DM with A1c goal<7 Atrial fibrillation: On Eliquis 5 mg twice daily routinely monitored by cardiology   Follow-up in 4 months or call earlier if needed     CC:  [place] provider: Dr. Kathleen Lime, Tommi Rumps, NP    I spent a prolonged >60 min of face-to-face and non-face-to-face time with patient and husband.  This included previsit chart review including review of recent ED visit, lab review, study review, order entry, electronic health record documentation, patient and husband discussion and education regarding prior history of stroke and etiology, residual deficits, worsening imbalance and falls, left sided pain post fall,  and answered all other related questions, importance of managing stroke risk factors, and answered all other multiple questions to patient and husband satisfaction  [name], AGNP-BC  [facility] [address] [place], [place] [address]  Phone [phone] Fax [phone] Note: This document was prepared with digital dictation and possible smart phrase technology. Any  transcriptional errors that result from this process are unintentional.

## 2021-04-13 NOTE — Progress Notes (Deleted)
[facility] [address] [place]. [place] [address] [phone]       OFFICE FOLLOW UP NOTE  Breanna Webster Date of Birth:  Oct 15, 1945 Medical Record Number:  [id number]   Reason for Referral: stroke follow up    CHIEF COMPLAINT:  Chief Complaint  Patient presents with   Follow-up    RM 3 with spouse richard  PT is well, here to follow up on recent fall.     HPI:  Update 04/13/2021 JM: Patient returns for acute visit due to increased falls. ED eval 12/14 post fall with c/o left flank pain and HA. CTH no acute finding. Lab work showed stable chronic anemia and hyperglycemia with improvement after D50.        History provided for reference purposes only Update 03/14/2021 AL: she called yesterday reporting a 5-15 minute episode of severe dizziness. Symptoms resolved spontaneously. She reports having a similar event 2-3 weeks ago of sudden dizziness. She reports not being able to stand up due to dizziness. She states that she was dry heaving. She was very nauseous. Symptoms lasted about 5 minutes. She also reports having "black out of right eye vision" that lasts 20 seconds twice over the past 2-3 weeks. Not associated with dizziness or headache. She has had a visual disturbance (husband states she has not mentioned this to him) a couple of times over the past few days where she sees a zig zag line in the right eye. Again, no headache or dizziness associated with visual disturbance. She had a similar event of right sided vision loss in 03/2020. CTA head and neck unremarkable for acute process. Right paratracheal nodule noted. She has seen optometry but not ophthalmologist. She does not drink much water due to overactive bladder. She drinks coffee in the mornings and usually tea at night.    She continues Eliquis, diltiazem, losartan. BP was 148/71 at beginning of visit, repeated manually 15 minutes later and was 140/60. Orthostatics 125/74 P 77 lying, 119/66 P  78 with standing and patient reported dizziness, 115/62 P 83 standing (less dizzy) and 115/58 P 80 after three minutes standing (less dizzy). She reports allergy to statin therapy. She has follow up echo with cardiology in 2 weeks. She was started on abx and steroids yesterday following visit with pulmonology for productive cough. BP was 112/72 at that visit.    Update 06/22/2020 JM: Breanna Webster returns for stroke follow-up accompanied by her husband.  Reports continued mild left hand tremor with mild left hand weakness -stable without worsening Prior complaints of gait unsteadiness -reports great improvement after working with PT -she has been able to increase ambulation distance - per husband, able to walk 3-4 blocks without stopping where previously difficulty ambulating very short distances She also reports occasional dizziness typically with position changes that last short duration  She does report in December, she presented to the ED with acute onset right eye visual loss which lasted approximately 15 minutes and then completely resolved.  CTA head/neck unremarkable. Reports evaluation by ophthalmology with no concerning findings that may have possibly contributed.  She has not had any additional visual loss or visual symptoms  Reports compliance on Eliquis 5 mg twice daily -denies bleeding or bruising Glucose levels stable with recent A1c 6.5 (04/2020) Routinely follows with cardiology, PCP and endocrinology  Due to concerns of continued excessive daytime fatigue, she was referred to [place] [facility] for possible repeat sleep study as prior sleep study in 08/2018 was negative  for sleep apnea.  Evaluated by Dr. Brett Fairy and underwent sleep study on 03/29/2020 which showed AHI 12.8 with REM sleep associated with continuous low oxygen saturation with total O2 desat time 70 minutes with SPO2 nadir 76% and mild PLM.  Underwent CPAP titration 04/21/2020 with apnea unresponsive to hypopneas, apnea and  desaturations but OSA responded partially to BiPAP 14/10 with central sleep apnea present with higher BiPAP pressures, sleep-related hypoxemia improved greatly on CPAP and BiPAP, presence of mild PLM confirmed.  Recommended initiating auto BiPAP which she started on 2/9.  She appears to be tolerating without difficulty but concerned as she continues to feel daytime fatigue.  No further concerns at this time   Update 12/21/2019 JM: Breanna Webster returns for stroke follow-up.  She is accompanied by her husband. Residual deficits of left hand tremor, unsteadiness and short-term memory impairment.  She will have occasional dizziness but typically upon standing from seated position.  Denies any reoccurring diplopia Prior concern of worsening diplopia, imbalance, dizziness and left hand shaking therefore obtain MRI which was negative for acute abnormality She was referred to PT at prior visit but for unknown reason, they were not called to schedule initial evaluation.  She is interested in pursuing PT Denies new stroke/TIA symptoms Remains on Eliquis 5 mg twice daily without bleeding or bruising for secondary stroke prevention and history of atrial fibrillation Blood pressure today 128/64 Glucose levels stable per patient HTN, HLD and DM currently managed by PCP She has multiple other complaints today: She also reports excessive daytime fatigue, insomnia, occasional nocturia and snoring Underwent sleep study approximately 1 year ago which showed mild sleep apnea with AHI 5.0.  Does report worsening daytime fatigue and insomnia and she continues to question accuracy of sleep study results She is currently being evaluated by neurosurgery for right underarm and chest numbness/tingling with concerns of thoracic involvement.  Plans on obtaining MRI cervical and thoracic scheduled on 01/15/2020 No further concerns at this time  Update 09/14/2019 JM: Breanna Webster is a 75 year old female with underlying medical  history of DVT history, aortic stenosis s/p TAVR 06/2018, hypothyroidism, pulmonary hypertension, HTN, DM, CAD s/p stenting 03/2018, hx of infective endocarditis 09/2018 and multifocal bilateral anterior posterior infarcts secondary to new onset A. fib in 09/2018.  Patient requests appointment today with concern for intermittent double vision, imbalance, dizziness and left hand shaking.  She is accompanied by her husband.  Previously seen roughly 6 months ago stable from a stroke standpoint and advised to follow-up as needed.  Prior complaints of intermittent diplopia, left hemiparesis and imbalance secondary to multiple cardioembolic strokes throughout the supratentorial brain, brainstem and cerebellum including multiple lesions within the corpus callosum.  She believes she continued to have intermittent symptoms but believes over the past few months symptoms have worsened or become more frequent.  she did have ophthalmology evaluation on 09/07/2019 with concern of bilateral diplopia worsened with visual tasks with saccadic changes.  Experiences worsening dizziness or diplopia while watching TV for too long.  Also experience dizziness if standing too quickly or rapid head movements.  Symptoms only last for 10 to 20 seconds and then resolve.  Episodes occur 1-2 times per week.  Denies headache, migraine, worsening weakness, numbness/tingling, speech impairment or confusion.  She endorses ongoing compliance with all prescribed medications including Eliquis 5 mg twice daily.  Blood pressure monitored at home which has been stable with today's reading 125/75.  Underlying mild cognitive impairment post stroke which has been stable without worsening.  She questions clearance for driving.  Continues to follow with PCP for HTN, HLD and DM management.  No further concerns at this time.  Update 03/12/2019 JM: Breanna Webster is a 75 year old female who is being seen today for stroke follow-up accompanied by her husband.  She  continues to experience balance difficulties, mild left hemiparesis and mild cognitive impairment without much improvement.  Her greatest concern today is ongoing shortness of breath which has been present since the beginning of this year.  Her shortness of breath severely limits her daily functioning and activity.  Recently seen by Dr. Tamala Julian, her cardiologist, who felt continued shortness of breath was multifactorial but highly encouraged increasing activity and possibly cardiac rehab.  She feels as though "something is being missed" and is overly frustrated.  She also continues to experience excessive daytime fatigue and insomnia at night.  Prior sleep study negative for OSA.  She continues on Eliquis and aspirin without bleeding or bruising.  Blood pressure today satisfactory at 128/78.  Continues on home Lexapro 20 mg daily and feels as though depression and anxiety stable.  She also continues on amitriptyline 25 mg nightly for prior concerns with headache.  She denies any reoccurring headaches at this time.  No further concerns at this time.  Initial visit 12/10/2018 JM: Residual deficits of mild left hemiparesis, mild left facial droop with occasional speech difficulty, balance difficulties, decreased vision and short-term memory concerns.  She does have difficulty ambulating at times stating her legs feel "rubbery" typically worse worsening in the morning and in the evening.  She is able to ambulate without assistive device.  Prior complaints of left-sided vision with sensation of a shade pulled over her left eye but this is since resolved and recently obtained new prescription glasses by ophthalmology.  She also endorses short-term memory concerns such as forgetting recent question/answer or her husband having to continuously repeat himself.  She denies improvement but her husband states he has seen improvement of her overall ambulation and memory concerns.  She has not received any therapy as this was not  recommended at discharge.  She is questioning possible participation in therapy. Previously experiencing left-sided severe headache with recent cessation with continued use of  amitriptyline 25 mg nightly and gabapentin 300 mg nightly.  It was recommended to discontinue gabapentin 300 mg nightly by PCP as she did not gain benefit but she is not aware of this and has continued.  Symptoms previously located left occipital radiating to frontal region with short duration sharp stabbing radiating pain which appears to be secondary to possible occipital neuralgia. Previously underwent sleep study for possible OSA which was negative Continues on Eliquis and aspirin 81 mg without bleeding or bruising Continues to follow with PCP for DM management Blood pressure today 107/60 States depression/anxiety well controlled with continuation of Lexapro No further concerns at this time  Stroke admission 10/21/2018: Breanna Webster is a 75 y.o. female with history of DVT, aortic stenosis s/p TAVR in March 2020, hypothyroidism, pulmonary hypertension, HTN, CADs/p stenting December 2019 recently admitted for infective endocarditis on the mitral valve treated with 2 weeks gentamicin and ceftriaxone via PEG who presented on 10/21/2018 to the hospital with increased shortness of breath and confusion x2 weeks along with headache.  No focal neurologic symptoms.  New A. Fib with RVR found in the ED and started on heparin drip.  Neurology consulted with work-up revealing multifocal bilateral anterior and posterior infarcts cardioembolic pattern as evidenced on MRI secondary to  new onset A. fib versus recent endocarditis.  MRI showed diffuse scattered subcentimeter foci of reduced diffusion and intermediate diffusion throughout the supratentorial brain, brainstem and cerebellum including multiple lesions within the corpus callosum.  Vessel imaging unremarkable with only mild arthrosclerotic bilateral ICA bifurcations.  2D echo  normal EF without mention of vegetation.  Recommended initiating D [place] with new onset of A. fib.  HTN stable.  LDL 85 and due to history of statin allergy with myalgias, statin initiation was not recommended.  A1c 7.5 and recommended follow-up with PCP for uncontrolled DM.  Other stroke risk factors include advanced age, obesity, CAD, MVR and status post TAVR 06/2018.  She was discharged home in stable condition without therapy.  She returned to ED on 11/17/2018 with complaints of sudden onset left-sided headache located left parietal occipital area at the base of her neck.  CTA unremarkable for dissection or other abnormality.  She did receive 2 migraine cocktails with improvement of headache symptoms.  She has since followed up with her PCP who initiated gabapentin along with continuation of nortriptyline 50 mg nightly.  She apparently did not gain benefit from gabapentin therefore recommended discontinuing gabapentin and nortriptyline by PCP and initiated on amitriptyline.  She did have follow-up with ophthalmologist with concerns of occipital neuralgia.        ROS:   14 system review of systems performed and negative with exception of see HPI  PMH:  Past Medical History:  Diagnosis Date   Anxiety    Arthritis    "back" (04/22/2018)   Back pain    Blood transfusion without reported diagnosis    CAD (coronary artery disease)    a. 03/2018 s/p PCI/DES to the RCA (3.0x15 Onyx DES).   Carotid artery stenosis    Mild   Chest pain    Chronic lower back pain    Cirrhosis (HCC)    Colon polyps    Diverticulitis    Diverticulosis    Esophageal thickening    seen on pre TAVR CT scan, also questionable cirrhosis. MRI recommended. Will refer to GI   Fatty liver    GERD (gastroesophageal reflux disease)    Grave's disease    History of colonic polyps 05/22/2017   History of hiatal hernia    Hypertension    Hypothyroidism    IBS (irritable bowel syndrome)    Osteopenia    Pulmonary nodules     seen on pre TAVR CT. likley benign. no follow up recommended if pt low risk.   S/P TAVR (transcatheter aortic valve replacement)    Severe aortic stenosis    Shortness of breath on exertion    Stroke (HCC)    Thalassemia minor    Thyroid disease    Type II diabetes mellitus (HCC)     PSH:  Past Surgical History:  Procedure Laterality Date   17 HOUR [place] STUDY N/A 03/03/2018   Procedure: 24 HOUR PH STUDY;  Surgeon: Mauri Pole, MD;  Location: [facility];  Service: Endoscopy;  Laterality: N/A;   AORTIC VALVE REPLACEMENT  06/2018   COLONOSCOPY     COLONOSCOPY W/ BIOPSIES AND POLYPECTOMY     CORONARY ANGIOGRAPHY Right 04/21/2018   Procedure: CORONARY ANGIOGRAPHY (CATH LAB);  Surgeon: [name], MD;  Location: [place] [facility];  Service: Cardiovascular;  Laterality: Right;   CORONARY STENT INTERVENTION N/A 04/22/2018   Procedure: CORONARY STENT INTERVENTION;  Surgeon: [name], MD;  Location: [facility] [facility];  Service: Cardiovascular;  Laterality: N/A;   DILATION AND CURETTAGE OF UTERUS     ESOPHAGEAL MANOMETRY N/A 03/03/2018   Procedure: ESOPHAGEAL MANOMETRY (EM);  Surgeon: Mauri Pole, MD;  Location: [facility];  Service: Endoscopy;  Laterality: N/A;   GASTRIC FUNDOPLICATION     HERNIA REPAIR     HYSTEROSCOPY     fibroids   LAPAROSCOPIC CHOLECYSTECTOMY     LAPAROSCOPY     fibroids   NISSEN FUNDOPLICATION  [id number]   POLYPECTOMY     RIGHT/LEFT HEART CATH AND CORONARY ANGIOGRAPHY N/A 02/20/2018   Procedure: RIGHT/LEFT HEART CATH AND CORONARY ANGIOGRAPHY;  Surgeon: [name], MD;  Location: [place] [facility];  Service: Cardiovascular;  Laterality: N/A;   TEE WITHOUT CARDIOVERSION N/A 07/08/2018   Procedure: TRANSESOPHAGEAL ECHOCARDIOGRAM (TEE);  Surgeon: Burnell Blanks, MD;  Location: [place];  Service: Open Heart Surgery;  Laterality: N/A;   TEE WITHOUT CARDIOVERSION  10/07/2018   TEE WITHOUT CARDIOVERSION N/A 10/07/2018   Procedure:  TRANSESOPHAGEAL ECHOCARDIOGRAM (TEE);  Surgeon: Jerline Pain, MD;  Location: [facility] ENDOSCOPY;  Service: Cardiovascular;  Laterality: N/A;   TONSILLECTOMY     TRANSCATHETER AORTIC VALVE REPLACEMENT, TRANSFEMORAL N/A 07/08/2018   Procedure: TRANSCATHETER AORTIC VALVE REPLACEMENT, TRANSFEMORAL;  Surgeon: Burnell Blanks, MD;  Location: [place];  Service: Open Heart Surgery;  Laterality: N/A;    Social History:  Social History   Socioeconomic History   Marital status: Married    Spouse name: Not on file   Number of children: 2   Years of education: Not on file   Highest education level: Not on file  Occupational History   Occupation: Tree surgeon of the [employer]  Tobacco Use   Smoking status: Never   Smokeless tobacco: Never  Vaping Use   Vaping Use: Never used  Substance and Sexual Activity   Alcohol use: No    Alcohol/week: 0.0 standard drinks   Drug use: Never   Sexual activity: Not Currently  Other Topics Concern   Not on file  Social History Narrative   Lives with husband in a one story home.     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   Education: college.   Social Determinants of Health   Financial Resource Strain: Low Risk    Difficulty of Paying Living Expenses: Not hard at all  Food Insecurity: No Food Insecurity   Worried About Charity fundraiser in the Last Year: Never true   Joiner in the Last Year: Never true  Transportation Needs: No Transportation Needs   Lack of Transportation (Medical): No   Lack of Transportation (Non-Medical): No  Physical Activity: Inactive   Days of Exercise per Week: 0 days   Minutes of Exercise per Session: 0 min  Stress: No Stress Concern Present   Feeling of Stress : Not at all  Social Connections: Socially Integrated   Frequency of Communication with Friends and Family: More than three times a week   Frequency of Social  Gatherings with Friends and Family: More than three times a week   Attends Religious Services: More than 4 times per year   Active Member of [employer] or Organizations: Yes   Attends Music therapist: More than 4 times per year   Marital Status: Married  Human resources officer Violence: Not At Risk   Fear of Current or Ex-Partner: No   Emotionally Abused: No   Physically Abused:  No   Sexually Abused: No    Family History:  Family History  Adopted: Yes  Problem Relation Age of Onset   Healthy Son        x 2   Headache Other        Cluster headaches   Heart failure Mother    Colon cancer Neg Hx    Pancreatic cancer Neg Hx    Rectal cancer Neg Hx    Stomach cancer Neg Hx     Medications:   Current Outpatient Medications on File Prior to Visit  Medication Sig Dispense Refill   amitriptyline (ELAVIL) 25 MG tablet Take 1 tablet (25 mg total) by mouth at bedtime. 90 tablet 1   b complex vitamins capsule Take 1 capsule by mouth daily.     BD INSULIN SYRINGE U/F 31G X 5/16" 1 ML MISC FOUR TIMES A DAY UNDER THE SKIN 200 each 1   BD PEN NEEDLE NANO 2ND GEN 32G X 4 MM MISC      cholecalciferol (VITAMIN D) 25 MCG (1000 UNIT) tablet Take 1,000 Units by mouth daily.     diltiazem (CARDIZEM CD) 120 MG 24 hr capsule TAKE ONE CAPSULE BY MOUTH DAILY 90 capsule 1   ELIQUIS 5 MG TABS tablet TAKE ONE TABLET BY MOUTH TWICE A DAY 60 tablet 2   escitalopram (LEXAPRO) 20 MG tablet TAKE ONE TABLET BY MOUTH DAILY 90 tablet 1   estradiol (ESTRACE) 0.1 MG/GM vaginal cream Place vaginally.     Evolocumab (REPATHA SURECLICK) 998 MG/ML SOAJ Inject 1 pen into the skin every 14 (fourteen) days. 2 mL 11   Lancets (ONETOUCH DELICA PLUS [id number]) MISC USE TO CHECK BLOOD SUGAR TWICE DAILY.. 200 each 0   levothyroxine (SYNTHROID) 137 MCG tablet Take 137 mcg by mouth once.     losartan (COZAAR) 50 MG tablet Take 50 mg by mouth daily.     metFORMIN (GLUCOPHAGE-XR) 500 MG 24 hr tablet Take 500 mg by mouth 2  (two) times daily.     mirabegron ER (MYRBETRIQ) 50 MG TB24 tablet Take 50 mg by mouth daily.     mirabegron ER (MYRBETRIQ) 50 MG TB24 tablet Take 1 tablet (50 mg total) by mouth daily. 21 tablet 0   Multiple Vitamin (MULITIVITAMIN WITH MINERALS) TABS Take 1 tablet by mouth daily.     nitroGLYCERIN (NITROSTAT) 0.4 MG SL tablet Place 1 tablet (0.4 mg total) under the tongue every 5 (five) minutes as needed for chest pain. 25 tablet 3   NOVOLIN N 100 UNIT/ML injection Inject 28 Units into the skin daily before breakfast.     NOVOLIN R 100 UNIT/ML injection 8 Units 2 (two) times daily before a meal. 10 UNITS THIRTY MINUTES BEFORE MEALS.  5 ADDITIONAL UNITS WITH CARBS OR SNACKS.     OneTouch Delica Lancets 76B MISC Tests BS 2 times     ONETOUCH VERIO test strip USE TO MONITOR GLUCOSE LEVELS TWICE DAILY 200 strip 3   No current facility-administered medications on file prior to visit.    Allergies:   Allergies  Allergen Reactions   Statins Other (See Comments)    Muscle aches     Physical Exam  Vitals:   04/13/21 1335  BP: 113/68  Pulse: 78  Weight: 177 lb (80.3 kg)  Height: 5' 5"  (1.651 m)    Body mass index is 29.45 kg/m. No results found.   General: well developed, well nourished, pleasant elderly Caucasian female, seated, in no evident distress Head:  head normocephalic and atraumatic.   Neck: supple with no carotid or supraclavicular bruits Cardiovascular: regular rate and rhythm, no murmurs Musculoskeletal: no deformity Skin:  no rash/petichiae Vascular:  Normal pulses all extremities   Neurologic Exam Mental Status: Awake and fully alert. Fluent speech and language. Oriented to place and time. Recent memory mildly impaired and remote memory intact.  Attention span, concentration and fund of knowledge mostly appropriate. Mood and affect appropriate.  Cranial Nerves: Pupils equal, briskly reactive to light. Extraocular movements full without nystagmus.  Visual fields full  to confrontation. Hearing intact. Facial sensation intact.  Mild left lower facial weakness Motor: Normal bulk and tone.  Full strength in all tested extremities except slight decreased left hand dexterity Sensory.: intact to touch , pinprick , position and vibratory sensation.  Coordination: Rapid alternating movements normal in all extremities except slightly decreased left hand. Finger-to-nose showed slight left hand ataxia and heel-to-shin performed accurately bilaterally.  Occasional left hand tremor but not consistently with action or at rest.  Negative for cogwheel rigidity or bradykinesia. Gait and Station: Arises from chair with mild difficulty. Stance is normal. Gait demonstrates  normal stride length and balance without use of assistive device. Able to heel toe with mild difficulty.  Romberg negative. Reflexes: 1+ and symmetric. Toes downgoing.       ASSESSMENT: Breanna Webster is a 75 y.o. year old female presented with increased shortness of breath and confusion x2 weeks along with headache on 10/21/2018 with recent admission on 10/03/2018 for sepsis and mitral valve endocarditis.  Stroke work-up revealed multifocal bilateral anterior and posterior infarcts cardioembolic pattern secondary to new onset A. fib versus recent endocarditis. Vascular risk factors include history of DVT, aortic stenosis status post TAVR 06/2018, pulmonary hypertension, HTN, DM, CAD status post stenting 03/2018, recent endocarditis, A. fib and new diagnosis of moderate OSA with initiation of BiPAP.     PLAN:  Hx multiple Cardiologic strokes including cerebellar and pontine stroke:  Residual deficits: mild LUE decreased dexterity with intermittent tremor and cognitive impairment.  Discussion again regarding intermittent tremor likely from prior stroke.  Symptoms do not interfere with daily activity or functioning therefore no indication to trial medications especially when risk outweighs benefit.  Discussed use  of weighted wristband for possible benefit. Continue Eliquis (apixaban) daily  for secondary stroke prevention.   Discussed secondary stroke prevention measures and importance of close PCP/endocrinology follow-up for aggressive stroke risk factor management including HTN with BP goal<130/90 and DM with A1c goal<7 Atrial fibrillation: On Eliquis 5 mg twice daily routinely monitored by cardiology Moderate OSA:  Followed by Dr. Brett Fairy - scheduled f/u visit 06/07/2021   Schedule follow-up in 6 weeks for initial CPAP compliance visit Plan to f/u in 6 months after that time for stroke and CPAP compliance visit   CC:  [place] provider: Dr. Kathleen Lime, Tommi Rumps, NP    I spent 40 minutes of face-to-face and non-face-to-face time with patient and husband.  This included previsit chart review, lab review, study review, order entry, electronic health record documentation, patient education regarding prior history of stroke and etiology, residual deficits, new diagnosis of sleep apnea and use of BiPAP machine and answered all other related questions, importance of managing stroke risk factors, and answered all other multiple questions to patient and husband satisfaction  [name], AGNP-BC  [facility] [address] [place], [place] [address]  Phone [phone] Fax [phone] Note: This document was prepared with digital dictation and possible smart phrase  technology. Any transcriptional errors that result from this process are unintentional.

## 2021-04-13 NOTE — Patient Instructions (Addendum)
You will be called to schedule imaging of your head and neck to look for any underlying concerns   Please let me know what location you would like to do aquatic therapy at and we can get this started     Followup in the future with me in 4 months or call earlier if needed       Thank you for coming to see [name] at [facility]. I hope we have been able to provide you high quality care today.  You may receive a patient satisfaction survey over the next few weeks. We would appreciate your feedback and comments so that we may continue to improve ourselves and the health of our patients.

## 2021-04-13 NOTE — Telephone Encounter (Signed)
I spoke with patient. She had a fall recently and hit her head, chest and back.  Was seen in ED.  Saw neurology today and MRI of her head was ordered.  Patient reports she has been having constant chest and back pain since fall.  She has not used NTG but this pain is different than pain she discussed with Dr Tamala Julian at last visit.  Has tried tylenol but it has not helped.  Patient is seeing PCP tomorrow.  I advised her to keep this appointment.  Patient concerned about possible damage to heart after her fall.  I told patient message would be sent to Dr Tamala Julian and we would call her back if additional testing recommended.

## 2021-04-14 ENCOUNTER — Encounter: Payer: Self-pay | Admitting: Adult Health

## 2021-04-14 ENCOUNTER — Ambulatory Visit (INDEPENDENT_AMBULATORY_CARE_PROVIDER_SITE_OTHER): Payer: Medicare Other | Admitting: Adult Health

## 2021-04-14 VITALS — BP 116/70 | HR 73 | Temp 97.9°F | Ht 65.0 in | Wt 177.8 lb

## 2021-04-14 DIAGNOSIS — R0789 Other chest pain: Secondary | ICD-10-CM | POA: Diagnosis not present

## 2021-04-14 DIAGNOSIS — R2681 Unsteadiness on feet: Secondary | ICD-10-CM

## 2021-04-14 DIAGNOSIS — I639 Cerebral infarction, unspecified: Secondary | ICD-10-CM

## 2021-04-14 NOTE — Progress Notes (Signed)
Subjective:    Patient ID: Breanna Webster, female    DOB: Jun 07, 1945, 75 y.o.   MRN: [id number]  HPI  75 year old female who  has a past medical history of Anxiety, Arthritis, Back pain, Blood transfusion without reported diagnosis, CAD (coronary artery disease), Carotid artery stenosis, Chest pain, Chronic lower back pain, Cirrhosis ([place]), Colon polyps, Diverticulitis, Diverticulosis, Esophageal thickening, Fatty liver, GERD (gastroesophageal reflux disease), Grave's disease, History of colonic polyps (05/22/2017), History of hiatal hernia, Hypertension, Hypothyroidism, IBS (irritable bowel syndrome), Osteopenia, Pulmonary nodules, S/P TAVR (transcatheter aortic valve replacement), Severe aortic stenosis, Shortness of breath on exertion, Stroke ([place]), Thalassemia minor, Thyroid disease, and Type II diabetes mellitus ([place]).  She is with her husband today for follow up after being seen recently in the ER for fall   He was seen in the emergency room on 04/05/2021 after mechanical fall "tripping over her feet" hitting her head, she is on anticoagulation.  Per ER note she had a left orbital and left flank bruising.  CT abdomen was unremarkable.  CT head with negative for acute intracranial abnormality.  She does report a fall about 3 weeks ago where she lost her balance as well.  She states that she felt like she was being pulled towards the left.  Denied any associated dizziness at that time but does have chronic intermittent dizziness.  She has gait instability and imbalance issues present since her stroke.  She does not exercise much due to fear of falling.  She also has chronic bilateral knee and low back pain.  She is concerned that the imbalance and gait instability has worsened over the past 2 weeks.  Additionally since her most recent fall she is also complaining of left breast pain which started after the fall.  Pain is constant but worse with coughing, sneezing, laying down, and with deep  breaths.  Pain radiates into her back.  She was seen by neurology yesterday who has ordered an MRI brought brain and MR cervical due to worsening gait.  On their exam the left lower breast pain seems to be more MSK in etiology from recent fall.   Review of Systems See HPI   Past Medical History:  Diagnosis Date   Anxiety    Arthritis    "back" (04/22/2018)   Back pain    Blood transfusion without reported diagnosis    CAD (coronary artery disease)    a. 03/2018 s/p PCI/DES to the RCA (3.0x15 Onyx DES).   Carotid artery stenosis    Mild   Chest pain    Chronic lower back pain    Cirrhosis (H[facility])    Colon polyps    Diverticulitis    Diverticulosis    Esophageal thickening    seen on pre TAVR CT scan, also questionable cirrhosis. MRI recommended. Will refer to GI   Fatty liver    GERD (gastroesophageal reflux disease)    Grave's disease    History of colonic polyps 05/22/2017   History of hiatal hernia    Hypertension    Hypothyroidism    IBS (irritable bowel syndrome)    Osteopenia    Pulmonary nodules    seen on pre TAVR CT. likley benign. no follow up recommended if pt low risk.   S/P TAVR (transcatheter aortic valve replacement)    Severe aortic stenosis    Shortness of breath on exertion    Stroke (HCC)    Thalassemia minor    Thyroid disease  Type II diabetes mellitus ([place])     Social History   Socioeconomic History   Marital status: Married    Spouse name: Not on file   Number of children: 2   Years of education: Not on file   Highest education level: Not on file  Occupational History   Occupation: Tree surgeon of the [employer]  Tobacco Use   Smoking status: Never   Smokeless tobacco: Never  Vaping Use   Vaping Use: Never used  Substance and Sexual Activity   Alcohol use: No    Alcohol/week: 0.0 standard drinks   Drug use: Never   Sexual activity: Not Currently  Other Topics Concern   Not on file  Social History Narrative    Lives with husband in a one story home.     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   Education: college.   Social Determinants of Health   Financial Resource Strain: Low Risk    Difficulty of Paying Living Expenses: Not hard at all  Food Insecurity: No Food Insecurity   Worried About Charity fundraiser in the Last Year: Never true   [place] in the Last Year: Never true  Transportation Needs: No Transportation Needs   Lack of Transportation (Medical): No   Lack of Transportation (Non-Medical): No  Physical Activity: Inactive   Days of Exercise per Week: 0 days   Minutes of Exercise per Session: 0 min  Stress: No Stress Concern Present   Feeling of Stress : Not at all  Social Connections: Socially Integrated   Frequency of Communication with Friends and Family: More than three times a week   Frequency of Social Gatherings with Friends and Family: More than three times a week   Attends Religious Services: More than 4 times per year   Active Member of Clubs or Organizations: Yes   Attends Music therapist: More than 4 times per year   Marital Status: Married  Human resources officer Violence: Not At Risk   Fear of Current or Ex-Partner: No   Emotionally Abused: No   Physically Abused: No   Sexually Abused: No    Past Surgical History:  Procedure Laterality Date   87 HOUR [place] STUDY N/A 03/03/2018   Procedure: [place];  Surgeon: Mauri Pole, MD;  Location: [facility];  Service: Endoscopy;  Laterality: N/A;   AORTIC VALVE REPLACEMENT  06/2018   COLONOSCOPY     COLONOSCOPY W/ BIOPSIES AND POLYPECTOMY     CORONARY ANGIOGRAPHY Right 04/21/2018   Procedure: CORONARY ANGIOGRAPHY (CATH LAB);  Surgeon: [name], MD;  Location: [facility];  Service: Cardiovascular;  Laterality: Right;   CORONARY STENT INTERVENTION N/A 04/22/2018   Procedure: CORONARY STENT INTERVENTION;   Surgeon: [name], MD;  Location: [facility] [place] [facility];  Service: Cardiovascular;  Laterality: N/A;   DILATION AND CURETTAGE OF UTERUS     ESOPHAGEAL MANOMETRY N/A 03/03/2018   Procedure: ESOPHAGEAL MANOMETRY (EM);  Surgeon: Mauri Pole, MD;  Location: [facility];  Service: Endoscopy;  Laterality: N/A;   GASTRIC FUNDOPLICATION     HERNIA REPAIR     HYSTEROSCOPY     fibroids   LAPAROSCOPIC CHOLECYSTECTOMY     LAPAROSCOPY     fibroids   NISSEN FUNDOPLICATION  1155M   POLYPECTOMY     RIGHT/LEFT HEART CATH AND CORONARY ANGIOGRAPHY N/A 02/20/2018   Procedure:  RIGHT/LEFT HEART CATH AND CORONARY ANGIOGRAPHY;  Surgeon: [name], MD;  Location: [facility];  Service: Cardiovascular;  Laterality: N/A;   TEE WITHOUT CARDIOVERSION N/A 07/08/2018   Procedure: TRANSESOPHAGEAL ECHOCARDIOGRAM (TEE);  Surgeon: Burnell Blanks, MD;  Location: [place];  Service: Open Heart Surgery;  Laterality: N/A;   TEE WITHOUT CARDIOVERSION  10/07/2018   TEE WITHOUT CARDIOVERSION N/A 10/07/2018   Procedure: TRANSESOPHAGEAL ECHOCARDIOGRAM (TEE);  Surgeon: Jerline Pain, MD;  Location: [facility];  Service: Cardiovascular;  Laterality: N/A;   TONSILLECTOMY     TRANSCATHETER AORTIC VALVE REPLACEMENT, TRANSFEMORAL N/A 07/08/2018   Procedure: TRANSCATHETER AORTIC VALVE REPLACEMENT, TRANSFEMORAL;  Surgeon: Burnell Blanks, MD;  Location: [place];  Service: Open Heart Surgery;  Laterality: N/A;    Family History  Adopted: Yes  Problem Relation Age of Onset   Healthy Son        x 2   Headache Other        Cluster headaches   Heart failure Mother    Colon cancer Neg Hx    Pancreatic cancer Neg Hx    Rectal cancer Neg Hx    Stomach cancer Neg Hx     Allergies  Allergen Reactions   Statins Other (See Comments)    Muscle aches    Current Outpatient Medications on File Prior to Visit  Medication Sig Dispense Refill   amitriptyline (ELAVIL) 25 MG tablet Take 1 tablet (25 mg  total) by mouth at bedtime. 90 tablet 1   b complex vitamins capsule Take 1 capsule by mouth daily.     BD INSULIN SYRINGE U/F 31G X 5/16" 1 ML MISC FOUR TIMES A DAY UNDER THE SKIN 200 each 1   BD PEN NEEDLE NANO 2ND GEN 32G X 4 MM MISC      cholecalciferol (VITAMIN D) 25 MCG (1000 UNIT) tablet Take 1,000 Units by mouth daily.     diltiazem (CARDIZEM CD) 120 MG 24 hr capsule TAKE ONE CAPSULE BY MOUTH DAILY 90 capsule 1   ELIQUIS 5 MG TABS tablet TAKE ONE TABLET BY MOUTH TWICE A DAY 60 tablet 2   escitalopram (LEXAPRO) 20 MG tablet TAKE ONE TABLET BY MOUTH DAILY 90 tablet 1   estradiol (ESTRACE) 0.1 MG/GM vaginal cream Place vaginally.     Evolocumab (REPATHA SURECLICK) 270 MG/ML SOAJ Inject 1 pen into the skin every 14 (fourteen) days. 2 mL 11   Lancets (ONETOUCH DELICA PLUS [id number]) MISC USE TO CHECK BLOOD SUGAR TWICE DAILY.. 200 each 0   levothyroxine (SYNTHROID) 137 MCG tablet Take 137 mcg by mouth once.     losartan (COZAAR) 50 MG tablet Take 50 mg by mouth daily.     metFORMIN (GLUCOPHAGE-XR) 500 MG 24 hr tablet Take 500 mg by mouth 2 (two) times daily.     mirabegron ER (MYRBETRIQ) 50 MG TB24 tablet Take 50 mg by mouth daily.     mirabegron ER (MYRBETRIQ) 50 MG TB24 tablet Take 1 tablet (50 mg total) by mouth daily. 21 tablet 0   Multiple Vitamin (MULITIVITAMIN WITH MINERALS) TABS Take 1 tablet by mouth daily.     nitroGLYCERIN (NITROSTAT) 0.4 MG SL tablet Place 1 tablet (0.4 mg total) under the tongue every 5 (five) minutes as needed for chest pain. 25 tablet 3   NOVOLIN N 100 UNIT/ML injection Inject 28 Units into the skin daily before breakfast.     NOVOLIN R 100 UNIT/ML injection 8 Units 2 (two) times daily before a  meal. 10 UNITS THIRTY MINUTES BEFORE MEALS.  5 ADDITIONAL UNITS WITH CARBS OR SNACKS.     OneTouch Delica Lancets 06O MISC Tests BS 2 times     ONETOUCH VERIO test strip USE TO MONITOR GLUCOSE LEVELS TWICE DAILY 200 strip 3   No current facility-administered medications  on file prior to visit.    BP 116/70 (BP Location: Left Arm, Patient Position: Sitting, Cuff Size: Normal)    Pulse 73    Temp 97.9 F (36.6 C) (Oral)    Ht 5' 5"  (1.651 m)    Wt 177 lb 12.8 oz (80.6 kg)    SpO2 98%    BMI 29.59 kg/m       Objective:   Physical Exam Vitals and nursing note reviewed.  Constitutional:      Appearance: Normal appearance.  Cardiovascular:     Rate and Rhythm: Normal rate and regular rhythm.     Pulses: Normal pulses.     Heart sounds: Normal heart sounds.  Pulmonary:     Effort: Pulmonary effort is normal.     Breath sounds: Normal breath sounds.  Chest:    Musculoskeletal:        General: Tenderness present.  Skin:    General: Skin is warm and dry.     Capillary Refill: Capillary refill takes less than 2 seconds.     Findings: Bruising present.     Comments: Significant bruising noted to left orbit   Neurological:     General: No focal deficit present.     Mental Status: She is alert and oriented to person, place, and time.     Motor: Tremor present.  Psychiatric:        Mood and Affect: Mood normal.        Behavior: Behavior normal.        Thought Content: Thought content normal.        Judgment: Judgment normal.      Assessment & Plan:  1. Gait instability - Follow up with neurology as directed. Get MRI's. Aquatic therapy would be great for her.   2. Chest wall pain - Reassurance given. Appears to be MSK related d/t fall.  - Conservative management with heat   [name], NP

## 2021-04-15 ENCOUNTER — Other Ambulatory Visit: Payer: Self-pay | Admitting: Adult Health

## 2021-04-18 ENCOUNTER — Ambulatory Visit: Payer: Medicare Other

## 2021-04-18 DIAGNOSIS — R0789 Other chest pain: Secondary | ICD-10-CM

## 2021-04-18 DIAGNOSIS — I10 Essential (primary) hypertension: Secondary | ICD-10-CM

## 2021-04-18 DIAGNOSIS — Z794 Long term (current) use of insulin: Secondary | ICD-10-CM

## 2021-04-18 DIAGNOSIS — I480 Paroxysmal atrial fibrillation: Secondary | ICD-10-CM

## 2021-04-18 DIAGNOSIS — Z8673 Personal history of transient ischemic attack (TIA), and cerebral infarction without residual deficits: Secondary | ICD-10-CM

## 2021-04-18 DIAGNOSIS — I2510 Atherosclerotic heart disease of native coronary artery without angina pectoris: Secondary | ICD-10-CM

## 2021-04-18 NOTE — Patient Instructions (Signed)
Visit Information  Thank you for taking time to visit with me today. Please don't hesitate to contact me if I can be of assistance to you before our next scheduled telephone appointment.  Following are the goals we discussed today:  Take all medications as prescribed Attend all scheduled provider appointments Call pharmacy for medication refills 3-7 days in advance of running out of medications Call provider office for new concerns or questions  keep appointment with eye doctor check blood sugar at prescribed times: twice daily and when you have symptoms of low or high blood sugar check pulse (heart) rate once a day make a plan to eat healthy take medicine as prescribed check blood pressure daily choose a place to take my blood pressure (home, clinic or office, retail store) call doctor for signs and symptoms of high blood pressure keep all doctor appointments eat more whole grains, fruits and vegetables, lean meats and healthy fats call for medicine refill 2 or 3 days before it runs out take all medications exactly as prescribed call doctor with any symptoms you believe are related to your medicine  Our next appointment is by telephone on 05/23/21 at 10:45 AM  Please call the care guide team at [phone] if you need to cancel or reschedule your appointment.   If you are experiencing a Mental Health or [place] or need someone to talk to, please call the Suicide and Crisis Lifeline: 988 call the [place] National Suicide Prevention Lifeline: 5632176999 or TTY: 5590490787 TTY 684-288-6300) to talk to a trained counselor call 1-800-273-TALK (toll free, 24 hour hotline) go to [facility] [address], [place] [phone]) call 911   Patient verbalizes understanding of instructions provided today and agrees to view in [place].  Peter Garter RN, Jackquline Denmark, CDE Care Management Coordinator [facility] [phone], Mobile [phone]

## 2021-04-18 NOTE — Chronic Care Management (AMB) (Signed)
Chronic Care Management   CCM RN Visit Note  04/18/2021 Name: Breanna Webster MRN: [id number] DOB: 01/02/1946  Subjective: Breanna Webster is a 75 y.o. year old female who is a primary care patient of [name], NP. The care management team was consulted for assistance with disease management and care coordination needs.    Engaged with patient by telephone for follow up visit in response to provider referral for case management and/or care coordination services.   Consent to Services:  The patient was given information about Chronic Care Management services, agreed to services, and gave verbal consent prior to initiation of services.  Please see initial visit note for detailed documentation.   Patient agreed to services and verbal consent obtained.   Assessment: Review of patient past medical history, allergies, medications, health status, including review of consultants reports, laboratory and other test data, was performed as part of comprehensive evaluation and provision of chronic care management services.   SDOH (Social Determinants of Health) assessments and interventions performed:    CCM Care Plan  Allergies  Allergen Reactions   Statins Other (See Comments)    Muscle aches    Outpatient Encounter Medications as of 04/18/2021  Medication Sig   amitriptyline (ELAVIL) 25 MG tablet Take 1 tablet (25 mg total) by mouth at bedtime.   b complex vitamins capsule Take 1 capsule by mouth daily.   BD INSULIN SYRINGE U/F 31G X 5/16" 1 ML MISC FOUR TIMES A DAY UNDER THE SKIN   BD PEN NEEDLE NANO 2ND GEN 32G X 4 MM MISC    cholecalciferol (VITAMIN D) 25 MCG (1000 UNIT) tablet Take 1,000 Units by mouth daily.   diltiazem (CARDIZEM CD) 120 MG 24 hr capsule TAKE ONE CAPSULE BY MOUTH DAILY   ELIQUIS 5 MG TABS tablet TAKE ONE TABLET BY MOUTH TWICE A DAY   escitalopram (LEXAPRO) 20 MG tablet TAKE ONE TABLET BY MOUTH DAILY   estradiol (ESTRACE) 0.1 MG/GM vaginal cream Place  vaginally.   Evolocumab (REPATHA SURECLICK) 425 MG/ML SOAJ Inject 1 pen into the skin every 14 (fourteen) days.   Lancets (ONETOUCH DELICA PLUS [id number]) MISC USE TO CHECK BLOOD SUGAR TWICE DAILY.Marland Kitchen   levothyroxine (SYNTHROID) 137 MCG tablet Take 137 mcg by mouth once.   losartan (COZAAR) 50 MG tablet TAKE ONE TABLET BY MOUTH DAILY   metFORMIN (GLUCOPHAGE-XR) 500 MG 24 hr tablet Take 500 mg by mouth 2 (two) times daily.   mirabegron ER (MYRBETRIQ) 50 MG TB24 tablet Take 50 mg by mouth daily.   mirabegron ER (MYRBETRIQ) 50 MG TB24 tablet Take 1 tablet (50 mg total) by mouth daily.   Multiple Vitamin (MULITIVITAMIN WITH MINERALS) TABS Take 1 tablet by mouth daily.   nitroGLYCERIN (NITROSTAT) 0.4 MG SL tablet Place 1 tablet (0.4 mg total) under the tongue every 5 (five) minutes as needed for chest pain.   NOVOLIN N 100 UNIT/ML injection Inject 28 Units into the skin daily before breakfast.   NOVOLIN R 100 UNIT/ML injection 8 Units 2 (two) times daily before a meal. 10 UNITS THIRTY MINUTES BEFORE MEALS.  5 ADDITIONAL UNITS WITH CARBS OR SNACKS.   OneTouch Delica Lancets 33I MISC Tests BS 2 times   ONETOUCH VERIO test strip USE TO MONITOR GLUCOSE LEVELS TWICE DAILY   No facility-administered encounter medications on file as of 04/18/2021.    Patient Active Problem List   Diagnosis Date Noted   COVID-19 02/14/2021   Postviral fatigue syndrome 02/14/2021   Difficulty with adaptive  servo-ventilation (ASV) use 02/14/2021   Sepsis secondary to UTI ([place]) 09/08/2020   Elevated ALT measurement 09/08/2020   History of CVA with residual deficit 09/08/2020   Hyperbilirubinemia 09/08/2020   Fatigue associated with anemia 08/03/2020   Cerebrovascular accident (CVA) due to embolism of right posterior cerebral artery ([place]) 08/03/2020   OSA treated with BiPAP 08/03/2020   Complex sleep apnea syndrome 08/03/2020   Treatment-emergent central sleep apnea 08/03/2020   Chronic intermittent hypoxia with  obstructive sleep apnea 04/21/2020   OSA (obstructive sleep apnea) 04/21/2020   History of cardioembolic stroke 40/81/4481   Gait disturbance, post-stroke 03/29/2020   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 ([place]) 03/29/2020   Anxiety    RLQ abdominal pain 10/22/2019   Paroxysmal atrial fibrillation ([place]) 05/22/2019   Iron deficiency anemia 05/07/2019   Atrial fibrillation with RVR ([place]) 10/21/2018   Cerebellar stroke, acute ([place]) 10/21/2018   Streptococcal endocarditis    Endocarditis of mitral valve 10/07/2018   Bacteremia due to Streptococcus Salivarius 10/07/2018   Sepsis ([place]) 85/63/1497   Acute metabolic encephalopathy 02/63/7858   Severe aortic stenosis 07/08/2018   S/P TAVR (transcatheter aortic valve replacement) 07/08/2018   Esophageal thickening    CAD in native artery 04/22/2018   Gastroesophageal reflux disease    Pulmonary hypertension ([place]) 02/21/2018   Essential hypertension 07/15/2017   History of colonic polyps 05/22/2017   Elevated liver function tests 12/05/2016   DOE (dyspnea on exertion) 07/19/2016   Thalassemia minor 05/29/2016   Left bundle branch block 12/06/2015   Upper airway cough syndrome 85/05/7739   Diastolic heart failure ([place]) 10/10/2015   Myalgia 02/17/2014   Carotid artery stenosis 06/05/2013   Eustachian tube dysfunction 05/07/2013   Neuropathy of leg 03/07/2012   Anemia, unspecified 01/31/2012   Hot flashes 08/24/2010   Diabetes mellitus due to underlying condition, uncontrolled 06/30/2010   Generalized abdominal pain 03/24/2010   Obesity (BMI 30.0-34.9) 12/21/2009   IBS (irritable bowel syndrome) 04/29/2009   Vitamin D deficiency 03/10/2009   Acute bronchitis 03/07/2009   Hypothyroidism 12/13/2008   Dyslipidemia 12/13/2008   Anxiety state 12/13/2008   Other specified disorders of bladder 12/13/2008    Conditions to be addressed/monitored:Atrial Fibrillation, CAD, HTN, HLD, DMII, and hx CVA  Care Plan : RN Care Manager  Plan of Care  Updates made by [name], RN since 04/18/2021 12:00 AM     Problem: Chronic Disease Management and Care Coordination Needs (DM2, CAD, Afib,HTN. Hx CVA )   Priority: High     Long-Range Goal: Establish Plan of Care for Chronic Disease Management Needs (DM2, CAD, Afib,HTN. Hx CVA )   Start Date: 03/09/2021  Expected End Date: 09/05/2021  Recent Progress: On track  Priority: High  Note:   Current Barriers:  Care Coordination needs related to Financial constraints related to cost of medications Chronic Disease Management support and education needs related to Atrial Fibrillation, CAD, HTN, DMII, GERD, Overactive Bladder, and hx CVA Pt states that she saw Tommi Rumps about her fall and her concerns.  States she is still hurting from her fall and wants to know why she is leaning to the left.  States she is to go to neuro PT on 04/26/21 and she hopes she can do water exercises.  States she still has chest pains at times but she does not take NTG.  States she is to start [place] for her lipids and states her medications are still expensive.  State her CBGs have been good with AM ranges  of 122-137 and PM 80-196.  States she has only had one lower reading of 66 back in November when she did not wake up from her nap soon enough.  States she does not use her walker in her home and usually leans on her husband or furniture when she moves around  [place]):  Patient will verbalize understanding of plan for management of Atrial Fibrillation, CAD, HTN, DMII, GERD, Overactive Bladder, and hx CVA as evidenced by voiced adherence to plan of care verbalize basic understanding of  Atrial Fibrillation, CAD, HTN, DMII, GERD, Overactive Bladder, and hx CVA disease process and self health management plan as evidenced by voiced understanding and teach back take all medications exactly as prescribed and will call provider for medication related questions as evidenced by dispense report and pt  verbalization attend all scheduled medical appointments: GI 04/26/21 , neuro PT 04/26/21, Dr. Melvyn Novas 04/27/21, Neurology 06/07/21 as evidenced by medical records demonstrate Improved adherence to prescribed treatment plan for Atrial Fibrillation, CAD, HTN, DMII, GERD, Overactive Bladder, and hx CVA as evidenced by readings within limits, voiced adherence to plan of care continue to work with RN Care Manager to address care management and care coordination needs related to  Atrial Fibrillation, CAD, HTN, DMII, GERD, Overactive Bladder, and hx CVA as evidenced by adherence to CM Team Scheduled appointments work with pharmacist to address Financial constraints related to cost of Myrbetriq and Eliquis related toAtrial Fibrillation, CAD, HTN, DMII, GERD, Overactive Bladder, and hx CVA as evidenced by review or EMR and patient or pharmacist report through collaboration with RN Care manager, provider, and care team.   Interventions: 1:1 collaboration with primary care provider regarding development and update of comprehensive plan of care as evidenced by provider attestation and co-signature Inter-disciplinary care team collaboration (see longitudinal plan of care) Evaluation of current treatment plan related to  self management and patient's adherence to plan as established by provider  Falls Interventions:  (Status:  Goal on track:  Yes.) Long Term Goal Advised patient of importance of notifying provider of falls Assessed for signs and symptoms of orthostatic hypotension Assessed for falls since last encounter Reviewed to use her walker if she feels unsteady    Hypertension Interventions: (Status:  Goal on track:  Yes.) Long Term Goal Last practice recorded BP readings:  BP Readings from Last 3 Encounters:  03/02/21 118/60  12/28/20 116/74  12/27/20 126/60  Most recent eGFR/CrCl: No results found for: EGFR  No components found for: CRCL  Evaluation of current treatment plan related to hypertension self  management and patient's adherence to plan as established by provider Provided education to patient re: stroke prevention, s/s of heart attack and stroke Reviewed medications with patient and discussed importance of compliance Discussed plans with patient for ongoing care management follow up and provided patient with direct contact information for care management team Advised patient, providing education and rationale, to monitor blood pressure daily and record, calling PCP for findings outside established parameters  Urinary frequency Condition stable.  Not addressed this visit. Long Term Goal Evaluation of current treatment plan related to Overactive Bladder, Financial constraints related to cost of Myrbetric  self-management and patient's adherence to plan as established by provider. Discussed plans with patient for ongoing care management follow up and provided patient with direct contact information for care management team Evaluation of current treatment plan related to Urinary frequency and patient's adherence to plan as established by provider Discussed plans with patient for ongoing care management follow  up and provided patient with direct contact information for care management team  Diabetes Interventions: (Status:  Goal on track:  Yes.) Long Term Goal Assessed patient's understanding of A1c goal: <7% Advised patient, providing education and rationale, to check cbg twice a day and record, calling provider for findings outside established parameters Review of patient status, including review of consultants reports, relevant laboratory and other test results, and medications completed Reviewed to be sure to not over sleep at nap time to avoid hypoglycemia.  Lab Results  Component Value Date   HGBA1C 7.4 (H) 09/08/2020  CAD Interventions: (Status:  Goal on track: Yes.) Long Term Goal Assessed understanding of CAD diagnosis Medications reviewed including medications utilized in CAD  treatment plan Provided education on importance of blood pressure control in management of CAD Provided education on Importance of limiting foods high in cholesterol Reviewed Importance of taking all medications as prescribed Reviewed Importance of attending all scheduled provider appointments Reviewed to use NTG for chest pain as directed by provider  AFIB Interventions:  (Status:  Goal on track: Yes.) Long Term Goal   Counseled on increased risk of stroke due to Afib and benefits of anticoagulation for stroke prevention Counseled on bleeding risk associated with Eliquis and importance of self-monitoring for signs/symptoms of bleeding Counseled on seeking medical attention after a head injury or if there is blood in the urine/stool Communicated to CCM PharmD concerns about cost of Eliquis  Patient Goals/Self-Care Activities: Take all medications as prescribed Attend all scheduled provider appointments Call pharmacy for medication refills 3-7 days in advance of running out of medications Call provider office for new concerns or questions  keep appointment with eye doctor check blood sugar at prescribed times: twice daily and when you have symptoms of low or high blood sugar check pulse (heart) rate once a day make a plan to eat healthy take medicine as prescribed check blood pressure daily choose a place to take my blood pressure (home, clinic or office, retail store) call doctor for signs and symptoms of high blood pressure keep all doctor appointments eat more whole grains, fruits and vegetables, lean meats and healthy fats call for medicine refill 2 or 3 days before it runs out take all medications exactly as prescribed call doctor with any symptoms you believe are related to your medicine Follow Up Plan:  Telephone follow up appointment with care management team member scheduled for:  05/23/21 The patient has been provided with contact information for the care management team and  has been advised to call with any health related questions or concerns.         Plan:Telephone follow up appointment with care management team member scheduled for:  05/23/21 The patient has been provided with contact information for the care management team and has been advised to call with any health related questions or concerns.  Peter Garter RN, Jackquline [place], CDE Care Management Coordinator [facility] [phone], Mobile [phone]

## 2021-04-19 ENCOUNTER — Telehealth: Payer: Self-pay | Admitting: Pharmacist

## 2021-04-19 NOTE — Chronic Care Management (AMB) (Addendum)
Chronic Care Management Pharmacy Assistant   Name: Breanna Webster  MRN: [id number] DOB: January 19, 1946  Reason for Encounter: Schedule Appointment Per Breanna Webster  Recent office visits:  04/18/21  Patient presented for CCM Nurse Visit  04/14/21 Breanna Peng, NP - Patient presented for Gait instability and other concerns. No medication changes.   04/11/21  Patient presented for CCM Nurse Visit  04/03/21 Breanna Peaches, LPN - Patient presented for [facility] Annual Wellness exam, No medication changes.  Recent consult visits:  04/13/21 Breanna Rider, NP (Neurology) - Patient presented for CVA due to embolism of right posterior cerebral artery and other concerns. No medication changes.  04/10/21 Breanna Webster, RPH (Pharmacist) - Patient presented for essential hypertension and other concerns.  Hospital visits:  Medication Reconciliation was completed by comparing discharge summary, patients EMR and Pharmacy list, and upon discussion with patient.  Patient presented to [place] [facility] ED on  04/05/21 due to Fall. Patient was present for 4 hours.  New?Medications Started at [facility] Discharge:?? -started  None  Medication Changes at Hospital Discharge: -Changed  None  Medications Discontinued at Hospital Discharge: -Stopped None  Medications that remain the same after Hospital Discharge:??  -All other medications will remain the same.    Medications: Outpatient Encounter Medications as of 04/19/2021  Medication Sig   amitriptyline (ELAVIL) 25 MG tablet Take 1 tablet (25 mg total) by mouth at bedtime.   b complex vitamins capsule Take 1 capsule by mouth daily.   BD INSULIN SYRINGE U/F 31G X 5/16" 1 ML MISC FOUR TIMES A DAY UNDER THE SKIN   BD PEN NEEDLE NANO 2ND GEN 32G X 4 MM MISC    cholecalciferol (VITAMIN D) 25 MCG (1000 UNIT) tablet Take 1,000 Units by mouth daily.   diltiazem (CARDIZEM CD) 120 MG 24 hr capsule TAKE ONE CAPSULE BY MOUTH DAILY    ELIQUIS 5 MG TABS tablet TAKE ONE TABLET BY MOUTH TWICE A DAY   escitalopram (LEXAPRO) 20 MG tablet TAKE ONE TABLET BY MOUTH DAILY   estradiol (ESTRACE) 0.1 MG/GM vaginal cream Place vaginally.   Evolocumab (REPATHA SURECLICK) 759 MG/ML SOAJ Inject 1 pen into the skin every 14 (fourteen) days.   Lancets (ONETOUCH DELICA PLUS [id number]) MISC USE TO CHECK BLOOD SUGAR TWICE DAILY.Marland Kitchen   levothyroxine (SYNTHROID) 137 MCG tablet Take 137 mcg by mouth once.   losartan (COZAAR) 50 MG tablet TAKE ONE TABLET BY MOUTH DAILY   metFORMIN (GLUCOPHAGE-XR) 500 MG 24 hr tablet Take 500 mg by mouth 2 (two) times daily.   mirabegron ER (MYRBETRIQ) 50 MG TB24 tablet Take 50 mg by mouth daily.   mirabegron ER (MYRBETRIQ) 50 MG TB24 tablet Take 1 tablet (50 mg total) by mouth daily.   Multiple Vitamin (MULITIVITAMIN WITH MINERALS) TABS Take 1 tablet by mouth daily.   nitroGLYCERIN (NITROSTAT) 0.4 MG SL tablet Place 1 tablet (0.4 mg total) under the tongue every 5 (five) minutes as needed for chest pain.   NOVOLIN N 100 UNIT/ML injection Inject 28 Units into the skin daily before breakfast.   NOVOLIN R 100 UNIT/ML injection 8 Units 2 (two) times daily before a meal. 10 UNITS THIRTY MINUTES BEFORE MEALS.  5 ADDITIONAL UNITS WITH CARBS OR SNACKS.   OneTouch Delica Lancets 93T MISC Tests BS 2 times   ONETOUCH VERIO test strip USE TO MONITOR GLUCOSE LEVELS TWICE DAILY   No facility-administered encounter medications on file as of 04/19/2021.   Notes: Call to patient spoke to  husband advised that Breanna Webster would like to schedule a phone call appointment with patient for 04/25/21 at 1:30 to discuss medication costs. He reported that she had not slept well last night and was still in bed would pass along the message to her and have her call once she woke up to confirm that it was ok.   Care Gaps: BP- 116/70(04/14/21) AWV - 12/22 Foot Exam - Overdue Flu Vaccine - Overdue Lab Results  Component Value Date   HGBA1C  7.4 (H) 09/08/2020    Star Rating Drugs: Metformin (Glucophage) 500 mg - Last filled 02/17/21 90 DS at [facility] Losartan (Cozaar) 50 mg - Last filled 01/19/21 90 DS at [facility]   Patient Assistance: [facility] ([facility] [place] [facility] ) app complete to be mailed to patient  [place] Pharmacist Assistant [phone]

## 2021-04-20 ENCOUNTER — Telehealth: Payer: Self-pay | Admitting: Adult Health

## 2021-04-20 NOTE — Telephone Encounter (Signed)
Scheduled 04/27/21 1pm   04/18/21 NPR sent to [facility] for open MRI TF

## 2021-04-21 NOTE — Telephone Encounter (Signed)
This was taking care of.

## 2021-04-22 DIAGNOSIS — I10 Essential (primary) hypertension: Secondary | ICD-10-CM

## 2021-04-22 DIAGNOSIS — I2510 Atherosclerotic heart disease of native coronary artery without angina pectoris: Secondary | ICD-10-CM

## 2021-04-22 DIAGNOSIS — I4891 Unspecified atrial fibrillation: Secondary | ICD-10-CM

## 2021-04-22 DIAGNOSIS — Z7984 Long term (current) use of oral hypoglycemic drugs: Secondary | ICD-10-CM | POA: Diagnosis not present

## 2021-04-22 DIAGNOSIS — E1159 Type 2 diabetes mellitus with other circulatory complications: Secondary | ICD-10-CM | POA: Diagnosis not present

## 2021-04-22 DIAGNOSIS — Z794 Long term (current) use of insulin: Secondary | ICD-10-CM

## 2021-04-23 HISTORY — PX: COLONOSCOPY: SHX174

## 2021-04-24 NOTE — Progress Notes (Signed)
I agree with the above plan 

## 2021-04-25 ENCOUNTER — Ambulatory Visit (INDEPENDENT_AMBULATORY_CARE_PROVIDER_SITE_OTHER): Payer: Medicare Other | Admitting: Pharmacist

## 2021-04-25 ENCOUNTER — Other Ambulatory Visit: Payer: Self-pay | Admitting: Adult Health

## 2021-04-25 DIAGNOSIS — I10 Essential (primary) hypertension: Secondary | ICD-10-CM

## 2021-04-25 DIAGNOSIS — I480 Paroxysmal atrial fibrillation: Secondary | ICD-10-CM

## 2021-04-25 DIAGNOSIS — E118 Type 2 diabetes mellitus with unspecified complications: Secondary | ICD-10-CM

## 2021-04-25 NOTE — Progress Notes (Signed)
Chronic Care Management Pharmacy Note  04/26/2021 Name:  Breanna Webster MRN:  [id number] DOB:  [birth year]  Summary: BP is at goal < 130/80 A1c is at goal < 7% Pt is taking 1/2 tablet of amitriptyline   Recommendations/Changes made from today's visit: -Recommended stopping amitriptyline as patient has not noticed a difference with being on 1/2 tablet -Requested refill for pen needles  Plan: Follow up after discussion with insurance for high cost of Eliquis   Subjective: Breanna Webster is an 76 y.o. year old female who is a primary patient of [name], NP.  The CCM team was consulted for assistance with disease management and care coordination needs.    Engaged with patient by telephone for follow up visit in response to provider referral for pharmacy case management and/or care coordination services.   Consent to Services:  The patient was given information about Chronic Care Management services, agreed to services, and gave verbal consent prior to initiation of services.  Please see initial visit note for detailed documentation.   Patient Care Team: [name], NP as PCP - General (Family Medicine) [name], MD as PCP - Cardiology (Cardiology) Madelin Rear, MD as Consulting Physician (Endocrinology) [name], RN as Case Manager Garvin Fila, MD as Referring Physician (Neurology) Viona Gilmore, [facility] as Pharmacist (Pharmacist)  Recent office visits: 04/18/21  Patient presented for [facility] Nurse Visit.    04/14/21 [name], NP - Patient presented for Gait instability and other concerns. No medication changes.    04/11/21  Patient presented for CCM Nurse Visit.   04/03/21 [name], LPN - Patient presented for Medicare Annual Wellness exam.  01/31/21 [name], RN - Patient presented for [facility] Nurse visit and coordination of needs.   12/19/20 [name], RN - Patient presented for [facility] Nurse visit and  coordination of needs.   12/08/20 [name], NP - Patient presented via [place] for [place] 19 virus. Prescribed Molnupiravir 800 mg.   11/23/20 Isaac Bliss, Rayford Halsted, MD - Patient presented for Acute cystitis without hematuria. Prescribed Ciprofloxacin HCL 250 mg.    11/07/20 [name], RN - Patient presented for [facility] Nurse visit and coordination of needs.  Recent consult visits: 04/13/21 [name], NP (Neurology) - Patient presented for CVA due to embolism of right posterior cerebral artery and other concerns. No medication changes.   04/10/21 [name], [name], RPH (Pharmacist) - Patient presented for essential hypertension and other concerns.  02/09/21 [name], MD (Neurology) - Patient presented to [facility] for CVA and other concerns. No medication changes.   12/28/20 Dohmeier, Asencion Partridge, MD (Neurology) - Patient presented for CVA and other concerns. No medication changes.   12/27/20 [name], [name], PA-C [name]) - Patient presented for Gastroesophageal reflux disease and other concerns. Changed time for Famotidine 20 mg to bedtime and Omeprazole 20 mg to before meals.   12/05/20 [name], [name] [facility] - Patient presented for Type 2 diabetes mellitus with other diabetic neuro complication and other concerns. No medication changes.   11/24/20 [name], PA ([facility]) - Patient presented for COVID - 19 screening.   11/16/20 [name], [name] [facility] - Patient presented for Type 2 diabetes mellitus with other diabetic neuro complication and other concerns. No medication changes.   11/09/20 [name], [name] [facility] - Patient presented for Adult hypothyroidism and other concerns.No medication changes.  10/21/20 [name] (Family Med) - Patient presented for Unilateral primary osteoarthritis of right knee. X-Ray  ordered, Bursa joint drained and Triamcinolone injection given.   10/17/20 [name] (Urology) - Patient presented for Acute cystitis without hematuria and other concerns. No other details available.   09/07/20 [name], MD (Oncology) - Patient presented for Anemia and other concerns. No medication changes.   09/05/20 [name] (Physical Med) - Patient presented for Unilateral primary osteoarthritis of left hip. Aspiration and Injection of [place] done.  Hospital visits: Medication Reconciliation was completed by comparing discharge summary, patients EMR and Pharmacy list, and upon discussion with patient.   Patient presented to [place] ED on  04/05/21 due to Fall. Patient was present for 4 hours.   New?Medications Started at [facility] Discharge:?? -started  None   Medication Changes at Hospital Discharge: -Changed  None   Medications Discontinued at Hospital Discharge: -Stopped None   Medications that remain the same after Hospital Discharge:??  -All other medications will remain the same.      Objective:  Lab Results  Component Value Date   CREATININE 0.59 04/05/2021   BUN 15 04/05/2021   GFR 75.63 03/13/2021   GFRNONAA >60 04/05/2021   GFRAA 99 12/05/2020   NA 138 04/05/2021   K 3.7 04/05/2021   CALCIUM 10.3 04/05/2021   CO2 27 04/05/2021   GLUCOSE 87 04/05/2021    Lab Results  Component Value Date/Time   HGBA1C 7.4 (H) 09/08/2020 04:16 AM   HGBA1C 6.5 05/05/2020 12:00 AM   HGBA1C 7.5 (H) 10/22/2018 04:45 AM   FRUCTOSAMINE 373 (H) 03/08/2016 11:43 AM   GFR 75.63 03/13/2021 11:54 AM   GFR 84.65 09/16/2020 02:43 PM   MICROALBUR 0.8 10/07/2015 01:30 PM   MICROALBUR 1.1 02/08/2014 02:49 PM    Last diabetic Eye exam:  Lab Results  Component Value Date/Time   HMDIABEYEEXA Retinopathy (A) 08/29/2020 12:00 AM    Last diabetic Foot exam: No results found for: HMDIABFOOTEX   Lab Results  Component Value Date    CHOL 188 12/05/2020   HDL 55 12/05/2020   LDLCALC 110 12/05/2020   LDLDIRECT 98.0 05/24/2014   TRIG 113 12/05/2020   CHOLHDL 5.3 10/22/2018    Hepatic Function Latest Ref Rng & Units 04/05/2021 12/27/2020 12/05/2020  Total Protein 6.5 - 8.1 g/dL 6.6 7.2 -  Albumin 3.5 - 5.0 g/dL 3.9 4.1 4.1  AST 15 - 41 U/L 23 28 34  ALT 0 - 44 U/L 28 36(H) 44(A)  Alk Phosphatase 38 - 126 U/L 52 72 80  Total Bilirubin 0.3 - 1.2 mg/dL 0.8 0.9 -  Bilirubin, Direct 0.0 - 0.3 mg/dL - 0.2 -    Lab Results  Component Value Date/Time   TSH 1.44 12/05/2020 12:00 AM   TSH 1.47 05/05/2020 12:00 AM   TSH 5.935 (H) 04/30/2019 11:49 AM   TSH 9.460 (H) 11/12/2018 09:39 AM   TSH 0.322 (L) 10/21/2018 03:30 PM   TSH 0.79 08/05/2017 11:52 AM   FREET4 0.89 04/30/2019 11:49 AM   FREET4 1.78 (H) 10/21/2018 03:30 PM    CBC Latest Ref Rng & Units 04/05/2021 03/13/2021 09/16/2020  WBC 4.0 - 10.5 K/uL 7.3 8.0 8.5  Hemoglobin 12.0 - 15.0 g/dL 10.0(L) 10.5(L) 9.6(L)  Hematocrit 36.0 - 46.0 % 32.7(L) 34.1(L) 29.9(L)  Platelets 150 - 400 K/uL 155 192.0 276.0    Lab Results  Component Value Date/Time   VD25OH 34.2 02/26/2017 11:16 AM   VD25OH 28.0 (  L) 11/21/2016 11:13 AM   VD25OH 20.86 (L) 05/29/2016 11:16 AM    Clinical ASCVD: Yes  The ASCVD Risk score (Arnett DK, et al., 2019) failed to calculate for the following reasons:   The patient has a prior MI or stroke diagnosis    Depression screen [facility] 2/9 04/03/2021 10/03/2020 03/14/2020  Decreased Interest 0 0 0  Down, Depressed, Hopeless 0 0 0  PHQ - 2 Score 0 0 0  Altered sleeping - - 0  Tired, decreased energy - - 0  Change in appetite - - 0  Feeling bad or failure about yourself  - - 0  Trouble concentrating - - 0  Moving slowly or fidgety/restless - - 0  Suicidal thoughts - - 0  PHQ-9 Score - - 0  Difficult doing work/chores - - Not difficult at all  Some recent data might be hidden   CHA2DS2/VAS Stroke Risk Points  Current as of 15 minutes ago     9 >=  2 Points: High Risk  1 - 1.99 Points: Medium Risk  0 Points: Low Risk    Last Change: N/A      Details    This score determines the patient's risk of having a stroke if the  patient has atrial fibrillation.       Points Metrics  1 Has Congestive Heart Failure:  Yes    Current as of 15 minutes ago  1 Has Vascular Disease:  Yes    Current as of 15 minutes ago  1 Has Hypertension:  Yes    Current as of 15 minutes ago  2 Age:  63    Current as of 15 minutes ago  1 Has Diabetes:  Yes    Current as of 15 minutes ago  2 Had Stroke:  Yes  Had TIA:  No  Had Thromboembolism:  No    Current as of 15 minutes ago  1 Female:  Yes    Current as of 15 minutes ago            Social History   Tobacco Use  Smoking Status Never  Smokeless Tobacco Never   BP Readings from Last 3 Encounters:  04/14/21 116/70  04/13/21 113/68  04/05/21 (!) 142/75   Pulse Readings from Last 3 Encounters:  04/14/21 73  04/13/21 78  04/05/21 78   Wt Readings from Last 3 Encounters:  04/14/21 177 lb 12.8 oz (80.6 kg)  04/13/21 177 lb (80.3 kg)  04/03/21 176 lb 8 oz (80.1 kg)   BMI Readings from Last 3 Encounters:  04/14/21 29.59 kg/m  04/13/21 29.45 kg/m  04/03/21 29.37 kg/m    Assessment/Interventions: Review of patient past medical history, allergies, medications, health status, including review of consultants reports, laboratory and other test data, was performed as part of comprehensive evaluation and provision of chronic care management services.   SDOH:  (Social Determinants of Health) assessments and interventions performed: Yes   SDOH Screenings   Alcohol Screen: Not on file  Depression (PHQ2-9): Low Risk    PHQ-2 Score: 0  Financial Resource Strain: Low Risk    Difficulty of Paying Living Expenses: Not hard at all  Food Insecurity: No Food Insecurity   Worried About Charity fundraiser in the Last Year: Never true   Ran Out of Food in the Last Year: Never true  Housing: Low  Risk    Last Housing Risk Score: 0  Physical Activity: Inactive   Days of Exercise per Week:  0 days   Minutes of Exercise per Session: 0 min  Social Connections: Engineer, building services of Communication with Friends and Family: More than three times a week   Frequency of Social Gatherings with Friends and Family: More than three times a week   Attends Religious Services: More than 4 times per year   Active Member of [employer] or Organizations: Yes   Attends Music therapist: More than 4 times per year   Marital Status: Married  Stress: No Stress Concern Present   Feeling of Stress : Not at all  Tobacco Use: Low Risk    Smoking Tobacco Use: Never   Smokeless Tobacco Use: Never   Passive Exposure: Not on file  Transportation Needs: No Transportation Needs   Lack of Transportation (Medical): No   Lack of Transportation (Non-Medical): No   Patient reports Dr Forbes Cellar has started her on Estradial and Myrbetriq.   Patient reports she is extremely lethargic and could take a nap twice and day and go to sleep at night and not have a problem. Patient noticed this about 6 months ago and never really recovered from her strokes about 2 years ago and from [place].  Patient just had another sleep study because she was having issues with her CPAP machine. She is waiting to get the results of her sleep study. Patient is getting up twice a night to go to the bathroom. Patient was tired before she starting using the machine. Patient goes to bed around the same time each night and does watch TV before bedtime.  Patient is having issues with estradiol cream right now as she is not sure about the amount she should be using.   CCM Care Plan  Allergies  Allergen Reactions   Statins Other (See Comments)    Muscle aches    Medications Reviewed Today     Reviewed by [name], RN (Registered Nurse) on 04/18/21 at 32  Med List Status: <None>   Medication Order Taking? Sig  Documenting Provider Last Dose Status Informant  amitriptyline (ELAVIL) 25 MG tablet [id number] No Take 1 tablet (25 mg total) by mouth at bedtime. Nafziger, Tommi Rumps, NP Taking Active   b complex vitamins capsule [id number] No Take 1 capsule by mouth daily. [provider] Taking Active   BD INSULIN SYRINGE U/F 31G X 5/16" 1 ML MISC [id number] No FOUR TIMES A DAY UNDER THE SKIN Nafziger, Tommi Rumps, NP Taking Active Self  BD PEN NEEDLE NANO 2ND GEN 32G X 4 MM MISC [id number] No  [provider] Taking Active   cholecalciferol (VITAMIN D) 25 MCG (1000 UNIT) tablet [id number] No Take 1,000 Units by mouth daily. [provider] Taking Active Self  diltiazem (CARDIZEM CD) 120 MG 24 hr capsule [id number] No TAKE ONE CAPSULE BY MOUTH DAILY Nafziger, Tommi Rumps, NP Taking Active   ELIQUIS 5 MG TABS tablet [id number] No TAKE ONE TABLET BY MOUTH TWICE A DAY Nafziger, Cory, NP Taking Active   escitalopram (LEXAPRO) 20 MG tablet [id number] No TAKE ONE TABLET BY MOUTH DAILY Nafziger, Tommi Rumps, NP Taking Active   estradiol (ESTRACE) 0.1 MG/GM vaginal cream [id number] No Place vaginally. [provider] Taking Active   Evolocumab (REPATHA SURECLICK) 656 MG/ML SOAJ [id number] No Inject 1 pen into the skin every 14 (fourteen) days. [name], MD Taking Active   Lancets (ONETOUCH DELICA PLUS FVCBSW96P) [place] [id number] No USE TO CHECK BLOOD SUGAR TWICE DAILY.Marland Kitchen Renato Shin, MD Taking Active Self  levothyroxine (SYNTHROID) [phone]  MCG tablet [id number] No Take 137 mcg by mouth once. [provider] Taking Active   losartan (COZAAR) 50 MG tablet [id number]  TAKE ONE TABLET BY MOUTH DAILY Nafziger, Tommi Rumps, NP  Active   metFORMIN (GLUCOPHAGE-XR) 500 MG 24 hr tablet [id number] No Take 500 mg by mouth 2 (two) times daily. [provider] Taking Active Self  mirabegron ER (MYRBETRIQ) 50 MG TB24 tablet [id number] No Take 50 mg by mouth daily. [provider] Taking Active   mirabegron ER  (MYRBETRIQ) 50 MG TB24 tablet [id number] No Take 1 tablet (50 mg total) by mouth daily. Nafziger, Tommi Rumps, NP Taking Active   Multiple Vitamin (MULITIVITAMIN WITH MINERALS) TABS [id number] No Take 1 tablet by mouth daily. [provider] Taking Active Self  nitroGLYCERIN (NITROSTAT) 0.4 MG SL tablet [id number] No Place 1 tablet (0.4 mg total) under the tongue every 5 (five) minutes as needed for chest pain. [name], MD Taking Active   NOVOLIN N 100 UNIT/ML injection [id number] No Inject 28 Units into the skin daily before breakfast. [provider] Taking Active Self  NOVOLIN R 100 UNIT/ML injection [id number] No 8 Units 2 (two) times daily before a meal. 10 UNITS THIRTY MINUTES BEFORE MEALS.  5 ADDITIONAL UNITS WITH CARBS OR SNACKS. [provider] Taking Active Self  OneTouch Delica Lancets 62H [place] [id number] No Tests BS 2 times [provider] Taking Active   [facility] VERIO test strip [id number] No USE TO MONITOR GLUCOSE LEVELS TWICE DAILY Renato Shin, MD Taking Active Self            Patient Active Problem List   Diagnosis Date Noted   COVID-19 02/14/2021   Postviral fatigue syndrome 02/14/2021   Difficulty with adaptive servo-ventilation (ASV) use 02/14/2021   Sepsis secondary to UTI ([place]) 09/08/2020   Elevated ALT measurement 09/08/2020   History of CVA with residual deficit 09/08/2020   Hyperbilirubinemia 09/08/2020   Fatigue associated with anemia 08/03/2020   Cerebrovascular accident (CVA) due to embolism of right posterior cerebral artery ([place]) 08/03/2020   OSA treated with BiPAP 08/03/2020   Complex sleep apnea syndrome 08/03/2020   Treatment-emergent central sleep apnea 08/03/2020   Chronic intermittent hypoxia with obstructive sleep apnea 04/21/2020   OSA (obstructive sleep apnea) 04/21/2020   History of cardioembolic stroke 51/70/0174   Gait disturbance, post-stroke 03/29/2020   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 ([place])  03/29/2020   Anxiety    RLQ abdominal pain 10/22/2019   Paroxysmal atrial fibrillation ([place]) 05/22/2019   Iron deficiency anemia 05/07/2019   Atrial fibrillation with RVR ([place]) 10/21/2018   Cerebellar stroke, acute ([place]) 10/21/2018   Streptococcal endocarditis    Endocarditis of mitral valve 10/07/2018   Bacteremia due to Streptococcus Salivarius 10/07/2018   Sepsis ([place]) 94/49/6759   Acute metabolic encephalopathy 16/38/4665   Severe aortic stenosis 07/08/2018   S/P TAVR (transcatheter aortic valve replacement) 07/08/2018   Esophageal thickening    CAD in native artery 04/22/2018   Gastroesophageal reflux disease    Pulmonary hypertension ([place]) 02/21/2018   Essential hypertension 07/15/2017   History of colonic polyps 05/22/2017   Elevated liver function tests 12/05/2016   DOE (dyspnea on exertion) 07/19/2016   Thalassemia minor 05/29/2016   Left bundle branch block 12/06/2015   Upper airway cough syndrome 99/35/7017   Diastolic heart failure ([place]) 10/10/2015   Myalgia 02/17/2014   Carotid artery stenosis 06/05/2013   Eustachian tube dysfunction 05/07/2013   Neuropathy of leg 03/07/2012   Anemia, unspecified 01/31/2012  Hot flashes 08/24/2010   Diabetes mellitus due to underlying condition, uncontrolled 06/30/2010   Generalized abdominal pain 03/24/2010   Obesity (BMI 30.0-34.9) 12/21/2009   IBS (irritable bowel syndrome) 04/29/2009   Vitamin D deficiency 03/10/2009   Acute bronchitis 03/07/2009   Hypothyroidism 12/13/2008   Dyslipidemia 12/13/2008   Anxiety state 12/13/2008   Other specified disorders of bladder 12/13/2008    Immunization History  Administered Date(s) Administered   Fluad Quad(high Dose 65+) 01/20/2019, 01/06/2020   Hep A / Hep B 06/15/2015, 07/15/2015, 12/27/2015   Influenza Split 01/16/2012   Influenza Whole 01/31/2010   Influenza, High Dose Seasonal PF 01/28/2014, 12/29/2014, 02/17/2016, 01/21/2018   Influenza-Unspecified 12/29/2012    PFIZER(Purple Top)SARS-COV-2 Vaccination 05/16/2019, 06/06/2019, 02/06/2020, 02/05/2021   Pneumococcal Conjugate-13 06/14/2017   Pneumococcal Polysaccharide-23 01/28/2012, 01/28/2014   Pneumococcal-Unspecified 04/24/2015   Tdap 12/29/2014   Zoster Recombinat (Shingrix) 03/13/2017, 05/24/2017   Zoster, Live 02/14/2012    Conditions to be addressed/monitored:  Hypertension, Hyperlipidemia, Diabetes, Atrial Fibrillation, Coronary Artery Disease, GERD, Hypothyroidism, Overactive Bladder, and Neuropathy  Conditions addressed this visit: Afib, diabetes, hyperlipidemia  Care Plan : CCM Pharmacy Care Plan  Updates made by Viona Gilmore, [place] since 04/26/2021 12:00 AM     Problem: Problem: Hypertension, Hyperlipidemia, Diabetes, Atrial Fibrillation, Coronary Artery Disease, GERD, Hypothyroidism, Overactive Bladder, and Neuropathy      Long-Range Goal: Patient-Specific Goal   Start Date: 02/20/2021  Expected End Date: 02/20/2022  Recent Progress: On track  Priority: High  Note:   Current Barriers:  Unable to independently monitor therapeutic efficacy Unable to maintain control of overactive bladder Suboptimal therapeutic regimen for diabetes  Pharmacist Clinical Goal(s):  Patient will achieve adherence to monitoring guidelines and medication adherence to achieve therapeutic efficacy maintain control of diabetes as evidenced by A1c  through collaboration with PharmD and provider.   Interventions: 1:1 collaboration with [name], NP regarding development and update of comprehensive plan of care as evidenced by provider attestation and co-signature Inter-disciplinary care team collaboration (see longitudinal plan of care) Comprehensive medication review performed; medication list updated in electronic medical record  Hypertension (BP goal <130/80) -Controlled -Current treatment: Losartan 50 mg 1 tablet daily - appropriate, effective, safe, accessible Diltiazem 120 mg 1 capsule  daily - appropriate, effective, safe, accessible -Medications previously tried: n/a -Current home readings: 135/70 (normal - that his high for her) - 110-120/70 (wrist cuff) -Current dietary habits: not a salt person; cooking at home; pre-packed vegetables (no canned vegetables) -Current exercise habits: not been able to exercise because she cannot walk with knees and back -Denies hypotensive/hypertensive symptoms -Educated on Importance of home blood pressure monitoring; Proper BP monitoring technique; Symptoms of hypotension and importance of maintaining adequate hydration; -Counseled to monitor BP at home weekly, document, and provide log at future appointments -Counseled on diet and exercise extensively Recommended to continue current medication Recommended purchasing an arm blood pressure cuff to ensure accuracy.  Hyperlipidemia: (LDL goal < 70) -Uncontrolled -Current treatment: Repatha inject every 14 day - appropriate, query effective -Medications previously tried: statins (muscle aches), Zetia -Current dietary patterns: did not discuss -Current exercise habits: unable to -Educated on Cholesterol goals;  -Counseled on diet and exercise extensively Recommended to continue current medication  CAD/History of stroke (Goal: prevent future events) -Controlled -Current treatment  Eliquis 5 mg 1 tablet twice daily - appropriate, effective, safe, accessible Nitroglycerin 0.4 mg 1 tablet as needed - appropriate, effective, safe, accessible -Medications previously tried: aspirin  -Recommended to continue current medication Recommended checking expiration date  for nitroglycerin.  Diabetes (A1c goal <7%) -Controlled (per patient report of recent A1c of 5.9%) -Current medications: Novolin N 28 units before breakfast - query appropriate Novolin R 8 units in morning and at night - query appropriate Metformin XR 500 mg 1 tablet twice daily - appropriate, effective, safe,  accessible -Medications previously tried: other insulins (switched due to cost) -Current home glucose readings fasting glucose: 109, 167, 169, 137, 162, 160 (twice a day) post prandial glucose: 152, 154, 129, 187 (right before dinner) -Reports hypoglycemic/hyperglycemic symptoms (lethargy) -Current meal patterns:  breakfast: bran cereal or 2 eggs and toast or yogurt or croissant with cheese  lunch: not taking insulin in afternoon  dinner: meat and vegetables (varies) snacks: n/a drinks: water; stopped drinking ice brand -Current exercise: unable to -Educated on A1c and blood sugar goals; Prevention and management of hypoglycemic episodes; Benefits of routine self-monitoring of blood sugar; Carbohydrate counting and/or plate method -Counseled to check feet daily and get yearly eye exams -Counseled on diet and exercise extensively Recommended to continue current medication Recommended discussion with endo about starting a Dexcom or Freestyle Libre and switching insulins to newer and longer lasting ones (Basaglar and Humalog)  Atrial Fibrillation (Goal: prevent stroke and major bleeding) -Controlled -CHADSVASC: 9 -Current treatment: Rate control: Diltiazem 120 mg 1 capsule daily - appropriate, effective, safe, accessible Anticoagulation: Eliquis 5 mg 1 tablet twice daily - appropriate, effective, safe, query accessible -Medications previously tried: none -Home BP and HR readings: normal at home is 68 -Counseled on increased risk of stroke due to Afib and benefits of anticoagulation for stroke prevention; bleeding risk associated with Eliquis and importance of self-monitoring for signs/symptoms of bleeding; avoidance of NSAIDs due to increased bleeding risk with anticoagulants; -Recommended to continue current medication Assessed patient finances. Patient does not qualify for patient assistance. Will find out why her copay is so high.  Depression/Anxiety (Goal: minimize  symptoms) -Controlled -Current treatment: Escitalopram 20 mg 1 tablet daily - appropriate, effective, safe, accessible -Medications previously tried/failed: unknown -PHQ9: 0 -GAD7: n/a -Educated on Benefits of medication for symptom control Benefits of cognitive-behavioral therapy with or without medication -Recommended to continue current medication   Hypothyroidism (Goal: TSH 2.5-4.5) -Not ideally controlled -Current treatment  Levothyroxine 137 mcg 1 tablet every morning before breakfast - appropriate, effective, safe, accessible -Medications previously tried: none  -Recommended repeat TSH due to fatigue.  Overactive bladder (Goal: minimize symptoms) -Controlled -Current treatment  Myrbetriq 50 mg 1 tablet daily - appropriate, query effective -Medications previously tried: none  -Recommended moving to bedtime to see if this helps frequent bathroom trips overnight.  GERD/Barrett's esophagus (Goal: minimize symptoms and protect esophagus) -Not ideally controlled -Current treatment  Omeprazole 20 mg 1 capsule twice daily - stopped taking Famotidine 20 mg 1 tablet at bedtime - stopped taking -Medications previously tried: none  -Counseled on benefits of taking omeprazole with Barrett's esophagus. Recommended to restart omeprazole at least once daily.  Osteopenia (Goal prevent fractures) -Controlled -Last DEXA Scan: 04/08/2017   T-Score femoral neck: -1.8  T-Score total hip: n/a  T-Score lumbar spine: -2.0  T-Score forearm radius: n/a  10-year probability of major osteoporotic fracture: 11.3%  10-year probability of hip fracture: 2.3% -Patient is not a candidate for pharmacologic treatment -Current treatment  Vitamin D 1000 units daily -Medications previously tried: none  -Recommend [phone] units of vitamin D daily. Recommend 1200 mg of calcium daily from dietary and supplemental sources. Recommend weight-bearing and muscle strengthening exercises for building and  maintaining bone density. -  Recommended repeat DEXA.  Osteoarthritis (Goal: minimize pain) -Uncontrolled -Current treatment  Acetaminophen 500 mg 2 capsules as needed -Medications previously tried: n/a  -Recommended trial of Voltaren gel for knee pain.  Health Maintenance -Vaccine gaps: influenza, COVID booster -Current therapy:  Multivitamin 1 tablet daily Vitamin B complex daily -Educated on Cost vs benefit of each product must be carefully weighed by individual consumer -Patient is satisfied with current therapy and denies issues -Recommended to continue current medication  Patient Goals/Self-Care Activities Patient will:  - check glucose at least daily, document, and provide at future appointments check blood pressure weekly, document, and provide at future appointments target a minimum of 150 minutes of moderate intensity exercise weekly  Follow Up Plan: The care management team will reach out to the patient again over the next 30 days.       Medication Assistance:  Insulins obtained through [employer] medication assistance program.  Enrollment ends 04/21/21  Compliance/Adherence/Medication fill history: Care Gaps: Foot Exam - Overdue COVID Booster #4 Therapist, music) - Overdue Flu Vaccine - Overdue A1C 7.4 BP- 116/70  Star-Rating Drugs: Metformin (Glucophage) 500 mg - Last filled 02/17/21 90 DS at [facility] Losartan (Cozaar) 50 mg - Last filled 01/19/21 90 DS at [facility]  Patient's preferred pharmacy is:  [id number] - [address] [place] [address] Phone: [phone] Fax: [phone]   Uses pill box? Yes Pt endorses 90% compliance  We discussed: Current pharmacy is preferred with insurance plan and patient is satisfied with pharmacy services Patient decided to: Continue current medication management strategy  Care Plan and Follow Up Patient Decision:  Patient agrees to Care Plan and  Follow-up.  Plan: The care management team will reach out to the patient again over the next 30 days.  [name], PharmD, [place] Pharmacist [place] [facility] [place]

## 2021-04-26 ENCOUNTER — Other Ambulatory Visit: Payer: Self-pay

## 2021-04-26 ENCOUNTER — Ambulatory Visit: Payer: Medicare Other | Attending: Neurology | Admitting: Physical Therapy

## 2021-04-26 ENCOUNTER — Ambulatory Visit (INDEPENDENT_AMBULATORY_CARE_PROVIDER_SITE_OTHER): Payer: Medicare Other | Admitting: Nurse Practitioner

## 2021-04-26 ENCOUNTER — Encounter: Payer: Self-pay | Admitting: Nurse Practitioner

## 2021-04-26 VITALS — BP 116/58 | HR 80 | Ht 65.0 in | Wt 176.0 lb

## 2021-04-26 DIAGNOSIS — K7460 Unspecified cirrhosis of liver: Secondary | ICD-10-CM

## 2021-04-26 DIAGNOSIS — R2689 Other abnormalities of gait and mobility: Secondary | ICD-10-CM

## 2021-04-26 DIAGNOSIS — R2681 Unsteadiness on feet: Secondary | ICD-10-CM

## 2021-04-26 DIAGNOSIS — K2270 Barrett's esophagus without dysplasia: Secondary | ICD-10-CM

## 2021-04-26 DIAGNOSIS — R29898 Other symptoms and signs involving the musculoskeletal system: Secondary | ICD-10-CM | POA: Diagnosis not present

## 2021-04-26 DIAGNOSIS — M6281 Muscle weakness (generalized): Secondary | ICD-10-CM | POA: Diagnosis not present

## 2021-04-26 DIAGNOSIS — E785 Hyperlipidemia, unspecified: Secondary | ICD-10-CM

## 2021-04-26 MED ORDER — OMEPRAZOLE 20 MG PO CPDR
20.0000 mg | DELAYED_RELEASE_CAPSULE | Freq: Two times a day (BID) | ORAL | 3 refills | Status: DC
  Filled 2021-04-28 – 2021-05-24 (×2): qty 60, 30d supply, fill #0
  Filled 2021-07-17: qty 60, 30d supply, fill #1

## 2021-04-26 NOTE — Patient Instructions (Addendum)
MEDICATION: We have sent the following medication to your pharmacy for you to pick up at your convenience: Omeprazole 20 MG. Take 1 twice a day. Make sure to take your morning dose 1 hours AFTER taking your thyroid medication.  Follow up in 3 months.  It was great seeing you today! Thank you for entrusting me with your care and choosing [facility].  Noralyn Pick, CRNP  The [facility] providers would like to encourage you to use [facility] to communicate with providers for non-urgent requests or questions.  Due to long hold times on the telephone, sending your provider a message by [facility] may be faster and more efficient way to get a response. Please allow 48 business hours for a response.  Please remember that this is for non-urgent requests/questions.  If you are age 78 or older, your body mass index should be between 23-30. Your Body mass index is 29.29 kg/m. If this is out of the aforementioned range listed, please consider follow up with your Primary Care Provider.  If you are age 63 or younger, your body mass index should be between 19-25. Your Body mass index is 29.29 kg/m. If this is out of the aformentioned range listed, please consider follow up with your Primary Care Provider.

## 2021-04-26 NOTE — Progress Notes (Signed)
04/26/2021 Breanna Webster [id number] Apr 20, 1946   Chief Complaint: Check esophagus after recent fall at home   History of Present Illness: Breanna Helmers. Breanna Webster is a 76 year old female with past medical history of hypertension, coronary artery disease s/p DES 9476, chronic diastolic CHF, s/p TAVR, paroxysmal atrial fibrillation, microcytic anemia, DM II, CVA, chronic imbalance since CVA with recent fall seen in the ED 04/05/2021, Graves' disease/hypothyroidism, Barrett's esophagus, GERD, past Nissen fundoplication surgery and NASH cirrhosis. She is followed by Dr. Havery Moros. She presents to our office today with concerns she possibly injured her esophagus after she fell at home on 04/05/2021. She fell and hit the left side of her face and left torso area with history of balance issues since her stroke. After her fall, she reported seeing stars, could not take a deep breath, felt a lot of pain to her chest/esophagus area. She went to the ED 04/05/2021 for further evaluation. A CTAP without evidence of any acute abdominal/pelvic abnormalities. Chest imaging was not done. She was assessed as stable and she was discharged home. Since her fall and ED visit, she reported coughing up green sputum with scant streaks or red blood with central chest wall pain with intermittent SOB. No frank hemoptysis. Her chest wall pain has significantly improved over the past week. She is scheduled to see her pulmonologist  tomorrow. She was taking Omeprazole 24m po bid but stopped taking it 2 weeks ago as her heartburn symptoms abated. No recent dysphagia. She is eating a regular diet without any difficulty. No upper or lower abdominal pain. She is passing a normal formed brown bowel movement every other day. No rectal bleeding or black stools. Her most recent EGD  and colonoscopy were 02/03/2019, see results below: She remains on Eliquis for afib.   EGD 02/03/2019: Z-line irregular, 36 cm from the incisors. Biopsied. -  Normal esophagus otherwise - A Nissen fundoplication was found. The wrap appears loose. - Normal stomach otherwise. - A single duodenal polyp.  - Recall EGD 3 to 5 years  Colonoscopy 02/03/2019: - One 3 mm polyp in the ascending colon, removed with a cold snare. Resected and retrieved. - Medium-sized lipoma in the ascending colon. - Four 3 to 6 mm polyps in the transverse colon, removed with a cold snare. Resected and retrieved. - Three 3 to 4 mm polyps in the descending colon, removed with a cold snare. Resected and retrieved. - Diverticulosis in the entire examined colon. - Tortuous colon. - Colonic spasm. - Recall colonoscopy 3 years - The examination was otherwise normal.  Path report: 1. Surgical [P], duodenal polyp - PEPTIC DUODENITIS, POLYPOID. 2. Surgical [P], lower esophagus - BARRETT'S MUCOSA, NEGATIVE FOR DYSPLASIA. 3. Surgical [P], colon, transverse, ascending, transverse, descending, polyp (8) - TUBULAR ADENOMA, NEGATIVE FOR HIGH GRADE DYSPLASIA (X MULTIPLE FRAGMENTS).   CBC Latest Ref Rng & Units 04/05/2021 03/13/2021 09/16/2020  WBC 4.0 - 10.5 K/uL 7.3 8.0 8.5  Hemoglobin 12.0 - 15.0 g/dL 10.0(L) 10.5(L) 9.6(L)  Hematocrit 36.0 - 46.0 % 32.7(L) 34.1(L) 29.9(L)  Platelets 150 - 400 K/uL 155 192.0 276.0  MCV 66.1.  CMP Latest Ref Rng & Units 04/05/2021 03/13/2021 12/27/2020  Glucose 70 - 99 mg/dL 87 163(H) -  BUN 8 - 23 mg/dL 15 19 -  Creatinine 0.44 - 1.00 mg/dL 0.59 0.76 -  Sodium 135 - 145 mmol/L 138 138 -  Potassium 3.5 - 5.1 mmol/L 3.7 4.0 -  Chloride 98 - 111 mmol/L 105 103 -  CO2 22 - 32 mmol/L 27 26 -  Calcium 8.9 - 10.3 mg/dL 10.3 10.7(H) -  Total Protein 6.5 - 8.1 g/dL 6.6 - 7.2  Total Bilirubin 0.3 - 1.2 mg/dL 0.8 - 0.9  Alkaline Phos 38 - 126 U/L 52 - 72  AST 15 - 41 U/L 23 - 28  ALT 0 - 44 U/L 28 - 36(H)      CTAP 04/05/2021: 1. No acute abdominal/pelvic findings, mass lesions or adenopathy. 2. Stable 12 mm peritoneal nodule in the left  abdomen. 3. Stable changes of cirrhosis. 4. Advanced atherosclerotic calcifications involving the aorta and iliac arteries. 5. Stable small uterine fibroids. 6. Status post cholecystectomy. No biliary dilatation. 7. Aortic atherosclerosis.   ECHO 02/2021: Left ventricular ejection fraction, by estimation, is 60 to 65%. The left ventricle has normal function. The left ventricle has no regional wall motion abnormalities. There is mild left ventricular hypertrophy. Left ventricular diastolic parameters are consistent with Grade I diastolic dysfunction (impaired relaxation). The average left ventricular global longitudinal strain is -19.9 %. The global longitudinal strain is normal. 1. 2. Right ventricular systolic function is normal. The right ventricular size is normal. 3. Left atrial size was moderately dilated. The mitral valve is normal in structure. No evidence of mitral valve regurgitation. Mild mitral stenosis. The mean mitral valve gradient is 4.5 mmHg. Severe mitral annular calcification. 4. The aortic valve has been repaired/replaced. Aortic valve regurgitation is not visualized. No aortic stenosis is present. There is a 23 mm Edwards Sapien prosthetic (TAVR) valve present in the aortic position. Echo findings are consistent with normal structure and function of the aortic valve prosthesis. Aortic valve area, by VTI measures 1.99 cm. Aortic valve mean gradient measures 15.0 mmHg. Aortic valve Vmax measures 2.43 m/s. 5. The inferior vena cava is normal in size with greater than 50% respiratory variability, suggesting right atrial pressure of 3 mmHg.   Past Medical History:  Diagnosis Date   Anxiety    Arthritis    "back" (04/22/2018)   Back pain    Blood transfusion without reported diagnosis    CAD (coronary artery disease)    a. 03/2018 s/p PCI/DES to the RCA (3.0x15 Onyx DES).   Carotid artery stenosis    Mild   Chest pain    Chronic lower back pain     Cirrhosis (HCC)    Colon polyps    Diverticulitis    Diverticulosis    Esophageal thickening    seen on pre TAVR CT scan, also questionable cirrhosis. MRI recommended. Will refer to GI   Fatty liver    GERD (gastroesophageal reflux disease)    Grave's disease    History of colonic polyps 05/22/2017   History of hiatal hernia    Hypertension    Hypothyroidism    IBS (irritable bowel syndrome)    Osteopenia    Pulmonary nodules    seen on pre TAVR CT. likley benign. no follow up recommended if pt low risk.   S/P TAVR (transcatheter aortic valve replacement)    Severe aortic stenosis    Shortness of breath on exertion    Stroke (HCC)    Thalassemia minor    Thyroid disease    Type II diabetes mellitus ([place])    Current Outpatient Medications on File Prior to Visit  Medication Sig Dispense Refill   amitriptyline (ELAVIL) 25 MG tablet Take 1 tablet (25 mg total) by mouth at bedtime. 90 tablet 1   b complex vitamins capsule Take 1  capsule by mouth daily.     BD PEN NEEDLE NANO 2ND GEN 32G X 4 MM MISC      cholecalciferol (VITAMIN D) 25 MCG (1000 UNIT) tablet Take 1,000 Units by mouth daily.     diltiazem (CARDIZEM CD) 120 MG 24 hr capsule TAKE ONE CAPSULE BY MOUTH DAILY 90 capsule 1   ELIQUIS 5 MG TABS tablet TAKE ONE TABLET BY MOUTH TWICE A DAY 60 tablet 2   escitalopram (LEXAPRO) 20 MG tablet TAKE ONE TABLET BY MOUTH DAILY 90 tablet 1   estradiol (ESTRACE) 0.1 MG/GM vaginal cream Place vaginally.     Evolocumab (REPATHA SURECLICK) 237 MG/ML SOAJ Inject 1 pen into the skin every 14 (fourteen) days. 2 mL 11   Lancets (ONETOUCH DELICA PLUS [id number]) MISC USE TO CHECK BLOOD SUGAR TWICE DAILY.. 200 each 0   levothyroxine (SYNTHROID) 137 MCG tablet Take 137 mcg by mouth once.     losartan (COZAAR) 50 MG tablet TAKE ONE TABLET BY MOUTH DAILY 90 tablet 1   metFORMIN (GLUCOPHAGE-XR) 500 MG 24 hr tablet Take 500 mg by mouth 2 (two) times daily.     mirabegron ER (MYRBETRIQ) 50 MG TB24 tablet  Take 50 mg by mouth daily.     mirabegron ER (MYRBETRIQ) 50 MG TB24 tablet Take 1 tablet (50 mg total) by mouth daily. 21 tablet 0   Multiple Vitamin (MULITIVITAMIN WITH MINERALS) TABS Take 1 tablet by mouth daily.     nitroGLYCERIN (NITROSTAT) 0.4 MG SL tablet Place 1 tablet (0.4 mg total) under the tongue every 5 (five) minutes as needed for chest pain. 25 tablet 3   NOVOLIN N 100 UNIT/ML injection Inject 28 Units into the skin daily before breakfast.     NOVOLIN R 100 UNIT/ML injection 8 Units 2 (two) times daily before a meal. 10 UNITS THIRTY MINUTES BEFORE MEALS.  5 ADDITIONAL UNITS WITH CARBS OR SNACKS. (04/26/2021:  Pt reports she only takes the 8 units 2x/daily before meals, not the 10 units)     OneTouch Delica Lancets 16W MISC Tests BS 2 times     ONETOUCH VERIO test strip USE TO MONITOR GLUCOSE LEVELS TWICE DAILY 200 strip 3   ULTICARE INSULIN SYRINGE 31G X 5/16" 1 ML MISC FOUR TIMES A DAY 200 each 1   No current facility-administered medications on file prior to visit.   Allergies  Allergen Reactions   Statins Other (See Comments)    Muscle aches   Current Medications, Allergies, Past Medical History, Past Surgical History, Family History and Social History were reviewed in [employer] record.  Review of Systems:   Constitutional: Negative for fever, sweats, chills or weight loss.  Respiratory: See HPI.  Cardiovascular: See HPI.  Gastrointestinal: See HPI.  Musculoskeletal: Negative for back pain or muscle aches.  Neurological: + Balance issues.   Physical Exam: BP (!) 116/58    Pulse 80    Ht 5' 5"  (1.651 m)    Wt 176 lb (79.8 kg)    SpO2 97%    BMI 29.29 kg/m   General: 76 year old female inNAD.  Head: Normocephalic and atraumatic. Eyes: No scleral icterus. Conjunctiva pink . Ears: Normal auditory acuity. Mouth: Dentition intact. No ulcers or lesions.  Lungs: Breath sounds diminished throughout. No wheezes, rhonchi or crackles. No free air  sounds.  Heart: Regular rate and rhythm. + Murmur.  Chest: No crepitus. No chest wall hematoma or tenderness.  Abdomen: Soft. Protuberant. Nontender and nondistended. No masses or  hepatomegaly. No ascites. Normal bowel sounds x 4 quadrants.  Rectal: Deferred.  Musculoskeletal: Symmetrical with no gross deformities. Extremities: No edema. Neurological: Alert oriented x 4. No focal deficits.  Psychological: Alert and cooperative. Normal mood and affect  Assessment and Recommendations:  1) Chest wall pain post fall injury 04/05/2021, unlikely esophageal pain or esophageal perforation , suspect musculoskeletal component +  bronchitis with pleuritic component. Chest/esophageal pain resolved at this time. -Follow up with pulmonology 04/27/2021 as scheduled, defer chest xray and chest CT to pulmonology  2) GERD, Barrett's esophagus -Resume Omeprazole 86m po bid -Recall EGD10/2023 vs 01/2024  3) Significant microcytic anemia, chronic. Hg 10.0. MCV 66.1 on 04/05/2021. Prior history of IDA. Normal iron level 74 with Hg 11.1 on 09/07/2020.  Query Thalassemia minor. CTAP without splenomegaly. -Recommend hematology consult, to discuss further with Dr. AHavery Moros 4) NASH cirrhosis   5) 8 tubular adenomatous polyps removed from the colon per colonoscopy 02/03/2019 -Recall colonoscopy due 01/2022

## 2021-04-26 NOTE — Therapy (Signed)
[facility] [address] [place], [place], [address] Phone: [phone]   Fax:  [phone]  Physical Therapy Evaluation  Patient Details  Name: Breanna Webster MRN: [id number] Date of Birth: [birth year] Referring Provider (PT): Dohmeier, Asencion Partridge   Encounter Date: 04/26/2021   PT End of Session - 04/26/21 1407     Visit Number 1    Number of Visits 17    Date for PT Re-Evaluation 06/23/21    Authorization Type Medicare/Generic Commercial    Progress Note Due on Visit 10    PT Start Time 6226    PT Stop Time 3335    PT Time Calculation (min) 45 min    Activity Tolerance Patient tolerated treatment well    Behavior During Therapy [facility] for tasks assessed/performed             Past Medical History:  Diagnosis Date   Anxiety    Arthritis    "back" (04/22/2018)   Back pain    Blood transfusion without reported diagnosis    CAD (coronary artery disease)    a. 03/2018 s/p PCI/DES to the RCA (3.0x15 Onyx DES).   Carotid artery stenosis    Mild   Chest pain    Chronic lower back pain    Cirrhosis (HCC)    Colon polyps    Diverticulitis    Diverticulosis    Esophageal thickening    seen on pre TAVR CT scan, also questionable cirrhosis. MRI recommended. Will refer to GI   Fatty liver    GERD (gastroesophageal reflux disease)    Grave's disease    History of colonic polyps 05/22/2017   History of hiatal hernia    Hypertension    Hypothyroidism    IBS (irritable bowel syndrome)    Osteopenia    Pulmonary nodules    seen on pre TAVR CT. likley benign. no follow up recommended if pt low risk.   S/P TAVR (transcatheter aortic valve replacement)    Severe aortic stenosis    Shortness of breath on exertion    Stroke ([place])    Thalassemia minor    Thyroid disease    Type II diabetes mellitus ([place])     Past Surgical History:  Procedure Laterality Date   65 HOUR [place] STUDY N/A 03/03/2018   Procedure: 24 HOUR PH STUDY;   Surgeon: Mauri Pole, MD;  Location: [facility];  Service: Endoscopy;  Laterality: N/A;   AORTIC VALVE REPLACEMENT  06/2018   COLONOSCOPY     COLONOSCOPY W/ BIOPSIES AND POLYPECTOMY     CORONARY ANGIOGRAPHY Right 04/21/2018   Procedure: CORONARY ANGIOGRAPHY (CATH LAB);  Surgeon: [name], MD;  Location: [place];  Service: Cardiovascular;  Laterality: Right;   CORONARY STENT INTERVENTION N/A 04/22/2018   Procedure: CORONARY STENT INTERVENTION;  Surgeon: [name], MD;  Location: [place] [facility];  Service: Cardiovascular;  Laterality: N/A;   DILATION AND CURETTAGE OF UTERUS     ESOPHAGEAL MANOMETRY N/A 03/03/2018   Procedure: ESOPHAGEAL MANOMETRY (EM);  Surgeon: Mauri Pole, MD;  Location: [facility];  Service: Endoscopy;  Laterality: N/A;   GASTRIC FUNDOPLICATION     HERNIA REPAIR     HYSTEROSCOPY     fibroids   LAPAROSCOPIC CHOLECYSTECTOMY     LAPAROSCOPY     fibroids   NISSEN FUNDOPLICATION  4562B   POLYPECTOMY     RIGHT/LEFT HEART CATH AND CORONARY ANGIOGRAPHY N/A 02/20/2018  Procedure: RIGHT/LEFT HEART CATH AND CORONARY ANGIOGRAPHY;  Surgeon: [name], MD;  Location: [facility];  Service: Cardiovascular;  Laterality: N/A;   TEE WITHOUT CARDIOVERSION N/A 07/08/2018   Procedure: TRANSESOPHAGEAL ECHOCARDIOGRAM (TEE);  Surgeon: Burnell Blanks, MD;  Location: [facility];  Service: Open Heart Surgery;  Laterality: N/A;   TEE WITHOUT CARDIOVERSION  10/07/2018   TEE WITHOUT CARDIOVERSION N/A 10/07/2018   Procedure: TRANSESOPHAGEAL ECHOCARDIOGRAM (TEE);  Surgeon: Jerline Pain, MD;  Location: [facility];  Service: Cardiovascular;  Laterality: N/A;   TONSILLECTOMY     TRANSCATHETER AORTIC VALVE REPLACEMENT, TRANSFEMORAL N/A 07/08/2018   Procedure: TRANSCATHETER AORTIC VALVE REPLACEMENT, TRANSFEMORAL;  Surgeon: Burnell Blanks, MD;  Location: [place];  Service: Open Heart Surgery;  Laterality: N/A;    There were no  vitals filed for this visit.    Subjective Assessment - 04/26/21 1411     Subjective My balance has been a problem since the stroke in 2020.  It seems to be getting worse.  I fell twice in two weeks, so I knew it was getting worse.  Has difficulty using cane and walker and feels using either of them would make her fall.  With the CVA, had more weakness on the left side.  Would like to do aquatic therapy as part of PT plan.    Pertinent History significant arthritis in knees    Patient Stated Goals Pt's goals for therapy are to get better with walking and with arthritis.    Currently in Pain? Yes    Pain Score 7     Pain Location Knee    Pain Orientation Right;Left    Pain Descriptors / Indicators Aching;Sharp    Pain Type Chronic pain    Pain Frequency Constant    Aggravating Factors  moving the wrong way    Pain Relieving Factors pain cream    Effect of Pain on Daily Activities Will attempt to address with exercises and through aquatic therapy, but pain is chronic and longstanding                [facility] PT Assessment - 04/26/21 1415       Assessment   Medical Diagnosis post-CVA, post-Covid    Referring Provider (PT) Dohmeier, Asencion Partridge    Onset Date/Surgical Date 12/28/20   MD order   Prior Therapy post-CVA in 2020      Precautions   Precautions Fall;Other (comment)    Precaution Comments No driving      Balance Screen   Has the patient fallen in the past 6 months Yes    How many times? 3    Has the patient had a decrease in activity level because of a fear of falling?  Yes    Is the patient reluctant to leave their home because of a fear of falling?  No      Home Environment   Living Environment Private residence    Living Arrangements Spouse/significant other    Available Help at Discharge Family    Type of [place]      Prior Function   Level of [place] Retired    Leisure Household tasks-laundry, making dinner       Observation/Other Assessments   Focus on Therapeutic Outcomes (FOTO)  NA      Posture/Postural Control   Posture/Postural Control Postural limitations    Postural Limitations Forward head;Rounded Shoulders    Posture Comments LUE resting tremo  ROM / Strength   AROM / PROM / Strength AROM;Strength      AROM   Overall AROM  Deficits    Overall AROM Comments knee range limited due to pain      Strength   Strength Assessment Site Hip;Knee;Ankle    Right/Left Hip Right;Left    Right Hip Flexion 3+/5    Left Hip Flexion 3+/5    Right/Left Knee Right;Left    Right Knee Flexion 4/5    Right Knee Extension 4/5    Left Knee Flexion 4/5    Left Knee Extension 4/5    Right/Left Ankle Right;Left    Right Ankle Dorsiflexion 4/5    Left Ankle Dorsiflexion 3+/5      Transfers   Transfers Sit to Stand;Stand to Sit    Sit to Stand 6: Modified independent (Device/Increase time);With upper extremity assist;From chair/3-in-1    Stand to Sit 6: Modified independent (Device/Increase time);Without upper extremity assist;To chair/3-in-1      Ambulation/Gait   Ambulation/Gait Yes    Ambulation/Gait Assistance 5: Supervision    Ambulation Distance (Feet) 60 Feet   x 2   Assistive device None    Gait Pattern Step-through pattern;Decreased step length - left;Decreased stance time - right;Decreased dorsiflexion - left;Decreased hip/knee flexion - right;Decreased hip/knee flexion - left;Poor foot clearance - left    Ambulation Surface Level;Indoor    Gait velocity 18.93 sec = 1.73 ft/sec      Standardized Balance Assessment   Standardized Balance Assessment Berg Balance Test;Timed Up and Go Test      Berg Balance Test   Sit to Stand Able to stand  independently using hands    Standing Unsupported Able to stand safely 2 minutes    Sitting with Back Unsupported but Feet Supported on Floor or Stool Able to sit safely and securely 2 minutes    Stand to Sit Controls descent by using hands     Transfers Able to transfer safely, definite need of hands    Standing Unsupported with Eyes Closed Able to stand 10 seconds with supervision    Standing Unsupported with Feet Together Able to place feet together independently and stand 1 minute safely    From Standing, Reach Forward with Outstretched Arm Can reach forward >12 cm safely (5")    From Standing Position, Pick up Object from Floor Unable to try/needs assist to keep balance    From Standing Position, Turn to Look Behind Over each Shoulder Looks behind from both sides and weight shifts well    Turn 360 Degrees Able to turn 360 degrees safely but slowly    Standing Unsupported, Alternately Place Feet on Step/Stool Needs assistance to keep from falling or unable to try    Standing Unsupported, One Foot in Front Able to plae foot ahead of the other independently and hold 30 seconds   forward posture   Standing on One Leg Unable to try or needs assist to prevent fall    Total Score 36    Berg comment: Scores <45/56 indicate increased fall risk.      Timed Up and Go Test   TUG Normal TUG    Normal TUG (seconds) 26.88    TUG Comments Scores > 13.5 sec indicates increased fall risk.                        Objective measurements completed on examination: See above findings.  PT Education - 04/26/21 1513     Education Details Eval results, PT POC and initial discussion about aquatic therapy; initial HEP    Person(s) Educated Patient    Methods Explanation;Demonstration;Handout    Comprehension Verbalized understanding;Returned demonstration              PT Short Term Goals - 04/26/21 1521       PT SHORT TERM GOAL #1   Title Pt will be independent with HEP for improved strength and flexibility, balance, transfers, and gait.   TARGET 05/26/2021    Time 4    Period Weeks    Status New      PT SHORT TERM GOAL #2   Title Pt will improve Berg score to at least 41/56 to decrease fall risk.     Baseline 36/56    Time 4    Period Weeks    Status New      PT SHORT TERM GOAL #3   Title Pt will improve TUG score to less than or equal to 21 sec for decreased fall risk.    Baseline 26.88 sec    Time 4    Period Weeks    Status New      PT SHORT TERM GOAL #4   Title Pt will verbalize understanding of fall prevention in home environment.    Time 4    Period Weeks    Status New               PT Long Term Goals - 04/26/21 1523       PT LONG TERM GOAL #1   Title Pt will be independent with HEP for improved flexibility and strength, balance, transfers, and gait.  TARGET 06/23/2021    Time 9    Period Weeks    Status New      PT LONG TERM GOAL #2   Title Pt will improve Berg score to at least 46/56 to decrease fall risk.    Time 9    Period Weeks    Status New      PT LONG TERM GOAL #3   Title Pt will improve TUG score to less than or equal to 16 sec for decreased fall risk.    Time 9    Period Weeks    Status New      PT LONG TERM GOAL #4   Title Pt will improve gait velocity to at least 2 ft/sec for improved gait efficiency and safety.    Baseline 1.73 ft/sec    Time 9    Period Weeks    Status New      PT LONG TERM GOAL #5   Title Pt will verbalize plans for continued community fitness upon d/c to maximize gains made in PT.    Time 9    Period Weeks    Status New                    Plan - 04/26/21 1515     Clinical Impression Statement Pt is a 76 year old female who presents to OPPT with history of arthritis, CVA with L weakness, Covid in August 2022.  Pt presents to OPPT with decreased strength, decreased balance, abnormal posture, decreased timing and coordination of gait.  Pt has history of at least 3 falls in past 6 months.  She is at increased fall risk per TUG, Berg and gait velocity scores.  Of note, pt has LUE resting tremor, which  pt reports has worsened and has been present since CVA.  Pt feels that pain and decreased balance is  limiting activity level, and she would benefit from skilled PT to address the above stated deficits to decrease fall risk and improve overall functional mobility.    Personal Factors and Comorbidities Comorbidity 3+    Comorbidities CVA due to embolism R PCA; COVID -19, post-viral fatigue syndrome  PMH COVID 11/2020, arthritis, back pain, CAD, cirrhosis, diverticulitis/osis, GERD, HTN, IBS, osteopenia, PAF    Examination-Activity Limitations Locomotion Level;Transfers;Stand;Caring for Others;Dressing    Examination-Participation Restrictions Meal Prep;Cleaning;Community Activity;Laundry    Stability/Clinical Decision Making Evolving/Moderate complexity    Clinical Decision Making Moderate    Rehab Potential Good    PT Frequency 2x / week    PT Duration 8 weeks   plus eval, so 9 wks totatl including eval week; will likely try for 1x/wk pool and 1x/wk in-clinic   PT Treatment/Interventions ADLs/Self Care Home Management;Aquatic Therapy;DME Instruction;Neuromuscular re-education;Balance training;Therapeutic exercise;Therapeutic activities;Functional mobility training;Gait training;Patient/family education;Manual techniques    PT Next Visit Plan Review initial HEP and add to HEP for balance.  Work on standing balance, gait activities.  Follow up about pool scheduling and OT eval (referral in chart)    Recommended Other Services Pt may benefit from OT services (reported decreased UE strength and reported/noted LUE tremor)-order is on chart, and pt needs to be scheduled for eval    Consulted and Agree with Plan of Care Patient             Patient will benefit from skilled therapeutic intervention in order to improve the following deficits and impairments:  Abnormal gait, Difficulty walking, Impaired flexibility, Decreased balance, Decreased mobility, Decreased strength, Postural dysfunction, Pain  Visit Diagnosis: Other abnormalities of gait and mobility  Unsteadiness on feet  Muscle weakness  (generalized)  Other symptoms and signs involving the musculoskeletal system     Problem List Patient Active Problem List   Diagnosis Date Noted   COVID-19 02/14/2021   Postviral fatigue syndrome 02/14/2021   Difficulty with adaptive servo-ventilation (ASV) use 02/14/2021   Sepsis secondary to UTI ([place]) 09/08/2020   Elevated ALT measurement 09/08/2020   History of CVA with residual deficit 09/08/2020   Hyperbilirubinemia 09/08/2020   Fatigue associated with anemia 08/03/2020   Cerebrovascular accident (CVA) due to embolism of right posterior cerebral artery ([place]) 08/03/2020   OSA treated with BiPAP 08/03/2020   Complex sleep apnea syndrome 08/03/2020   Treatment-emergent central sleep apnea 08/03/2020   Chronic intermittent hypoxia with obstructive sleep apnea 04/21/2020   OSA (obstructive sleep apnea) 04/21/2020   History of cardioembolic stroke 93/81/8299   Gait disturbance, post-stroke 03/29/2020   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 ([place]) 03/29/2020   Anxiety    RLQ abdominal pain 10/22/2019   Paroxysmal atrial fibrillation ([place]) 05/22/2019   Iron deficiency anemia 05/07/2019   Atrial fibrillation with RVR ([place]) 10/21/2018   Cerebellar stroke, acute ([place]) 10/21/2018   Streptococcal endocarditis    Endocarditis of mitral valve 10/07/2018   Bacteremia due to Streptococcus [place] 10/07/2018   Sepsis ([place]) 37/16/9678   Acute metabolic encephalopathy 93/81/0175   Severe aortic stenosis 07/08/2018   S/P TAVR (transcatheter aortic valve replacement) 07/08/2018   Esophageal thickening    CAD in native artery 04/22/2018   Gastroesophageal reflux disease    Pulmonary hypertension ([place]) 02/21/2018   Essential hypertension 07/15/2017   History of colonic polyps 05/22/2017   Elevated liver function tests 12/05/2016   DOE (dyspnea  on exertion) 07/19/2016   Thalassemia minor 05/29/2016   Left bundle branch block 12/06/2015   Upper airway cough syndrome  81/38/8719   Diastolic heart failure ([place]) 10/10/2015   Myalgia 02/17/2014   Carotid artery stenosis 06/05/2013   Eustachian tube dysfunction 05/07/2013   Neuropathy of leg 03/07/2012   Anemia, unspecified 01/31/2012   Hot flashes 08/24/2010   Diabetes mellitus due to underlying condition, uncontrolled 06/30/2010   Generalized abdominal pain 03/24/2010   Obesity (BMI 30.0-34.9) 12/21/2009   IBS (irritable bowel syndrome) 04/29/2009   Vitamin D deficiency 03/10/2009   Acute bronchitis 03/07/2009   Hypothyroidism 12/13/2008   Dyslipidemia 12/13/2008   Anxiety state 12/13/2008   Other specified disorders of bladder 12/13/2008    Korianna Washer W., PT 04/26/2021, 3:27 PM  [facility] [address], [place], [place], [address] Phone: [phone]   Fax:  [phone]  Name: Breanna Webster MRN: [id number] Date of Birth: [birth year]

## 2021-04-26 NOTE — Patient Instructions (Signed)
Access Code: [facility] URL: [email or web address] Date: 04/26/2021 Prepared by: [facility]  Exercises Seated Heel Toe Raises - 1 x daily - 5 x weekly - 1-2 sets - 10 reps Seated Long Arc Quad - 1 x daily - 5 x weekly - 1-2 sets - 10 reps

## 2021-04-26 NOTE — Patient Instructions (Signed)
Hi Shemekia  It was great to catch up with you again! I will let you know what I can find out about the Eliquis cost.  Please reach out to me if you have any questions or need anything!  Best, Maddie  [name], PharmD, [place] Pharmacist [facility]   Visit Information   Goals Addressed   None    Patient Care Plan: RNCM:Diabetes Type 2 (Adult)  Completed 03/09/2021   Problem Identified: Lack of Long Term plan for self managment of Type 2 DM Resolved 03/09/2021  Priority: Medium     Long-Range Goal: Effective long term self management of Type 2 DM Completed 03/09/2021  Start Date: 10/03/2020  Expected End Date: 03/23/2021  Recent Progress: On track  Priority: Medium  Note:   Resolving due to duplicate goal  Objective:  Lab Results  Component Value Date   HGBA1C 7.4 (H) 09/08/2020   Lab Results  Component Value Date   CREATININE 0.67 09/16/2020   CREATININE 0.74 09/11/2020   CREATININE 0.77 09/10/2020   No results found for: EGFR Current Barriers:  Knowledge Deficits related to basic Diabetes pathophysiology and self care/management Knowledge Deficits related to medications used for management of diabetes Unable to independently self manage Type 2 DM Unable to perform IADLs independently-Husband assists with transportation  Pt states that she her blood sugars have been up some this last week with readings up to 200.  States she has not changed her diet but she did think she and her husband had a mild cold/virus this last week  States she is to see Dr. [name] 02/28/21. States her last A1C was 6.9%. Denies any recent hypoglycemia Reports following a low CHO diet.  States her husband has been helping her as needed.  States she did have a fall that injured her shoulder Case Manager Clinical Goal(s):  patient will demonstrate improved adherence to prescribed treatment plan for diabetes self care/management as evidenced by: daily  monitoring and recording of CBG  adherence to ADA/ carb modified diet adherence to prescribed medication regimen contacting provider for new or worsened symptoms or questions Interventions:  Collaboration with Carlisle Cater, Tommi Rumps, NP regarding development and update of comprehensive plan of care as evidenced by provider attestation and co-signature Inter-disciplinary care team collaboration (see longitudinal plan of care) Provided education to patient about basic DM disease process Reviewed medications with patient and discussed importance of medication adherence Discussed plans with patient for ongoing care management follow up and provided patient with direct contact information for care management team Reinforced s/sx hypoglycemia and hyperglycemia and importance of correct treatment Reviewed scheduled/upcoming provider appointments including: no upcoming primary care provider scheduled, sleep study 02/11/21, GI 03/02/21, Dr. [name] 02/28/21 neurology 05/18/21 Reinforced to check cbg before meals and record, calling endocrinologist or primary care provider   for findings outside established parameters.   Review of patient status, including review of consultants reports, relevant laboratory and other test results, and medications completed. Reviewed importance of increased activity and fall prevention Self-Care Activities - Self administers oral medications as prescribed Self administers insulin as prescribed Attends all scheduled provider appointments Checks blood sugars as prescribed and utilize hyper and hypoglycemia protocol as needed Adheres to prescribed ADA/carb modified Patient Goals: - check blood sugar at prescribed times - check blood sugar if I feel it is too high or too low - enter blood sugar readings and medication or insulin into daily log - take the blood sugar log to all doctor visits -  change to whole grain breads, cereal, pasta - drink 6 to 8 glasses of water each day - fill  half of plate with vegetables - manage portion size - read food labels for fat, fiber, carbohydrates and portion size - switch to sugar-free drinks - keep appointment with eye doctor - check feet daily for cuts, sores or redness Follow Up Plan: Telephone follow up appointment with care management team member scheduled for: 03/09/21 at 11:30 AM The patient has been provided with contact information for the care management team and has been advised to call with any health related questions or concerns.      Patient Care Plan: RNCM:Urinary Incontinence (Adult)  Completed 03/09/2021   Problem Identified: Symptom Management (Urinary Incontinence) Resolved 03/09/2021  Priority: High     Long-Range Goal: Urinary Incontinence Symptoms Manged Completed 03/09/2021  Start Date: 10/03/2020  Expected End Date: 03/23/2021  Recent Progress: On track  Priority: High  Note:   Resolving due to duplicate goal  Current Barriers:  Ineffective Self Health Maintenance of urinary incontinence with hx of UTI, DM2, CAD, hx of CVA, atrial fib,  anemia Unable to independently self manage urinary incontinence Unable to perform IADLs independently-husband provides transportation to appointments States that she is still having nocturia 1-2 a night and she has urgency if she does not get to bathroom soon enough States she is to saw Dr. [name] on 12/20/20.  States she thinks the Myrbetic he gave her made her nauseated so she is not currently taking  Clinical Goal(s):  Collaboration with Pension scheme manager, Tommi Rumps, NP regarding development and update of comprehensive plan of care as evidenced by provider attestation and co-signature Inter-disciplinary care team collaboration (see longitudinal plan of care) patient will work with care management team to address care coordination and chronic disease management needs related to Disease Management Educational Needs Care Coordination   Interventions:  Evaluation of current  treatment plan related to CAD, HTN, HLD, DMII, Anxiety, and hx CVA, atrial fib , Transportation, ADL IADL limitations, and Inability to perform IADL's independently self-management and patient's adherence to plan as established by provider. Collaboration with [name], NP regarding development and update of comprehensive plan of care as evidenced by provider attestation       and co-signature Inter-disciplinary care team collaboration (see longitudinal plan of care) Discussed plans with patient for ongoing care management follow up and provided patient with direct contact information for care management team Reinforced on s/sx UTI and when to call MD Reinforced to try doing pelvic floor exercises when she feels urgency and to void every 2 hours during the day Reinforced to keep perineal area clean and dry Reinforced to keep urology appointments with Dr. [name] and encouraged to discuss treatment options for incontinence and overactive bladder Referral to CCM PharmD for medication review, adherence and polypharmacy  Self Care Activities:  Patient verbalizes understanding of plan to self manage urinary incontinence Self administers medications as prescribed Attends all scheduled provider appointments Calls pharmacy for medication refills Calls provider office for new concerns or questions Patient Goals: - clean and dry skin well - keep skin dry - use a fragrance-free lotion on skin - wear a protective pad or garment Follow Up Plan: Telephone follow up appointment with care management team member scheduled for: 03/09/21 at 11:30 AM The patient has been provided with contact information for the care management team and has been advised to call with any health related questions or concerns.      Patient Care Plan: [id number]  Disease Management(HTN, CAD, Atrial fibrillation)  Completed 03/09/2021   Problem Identified: Lack of long term management of Cardiovascular Disease(HTN,  CAD, Atrial fibrillation Resolved 03/09/2021  Priority: Medium     Long-Range Goal: Effective Cardiovascular Disease Self  Management(HTN, CAD, Atrial fibrillation) Completed 03/09/2021  Start Date: 10/03/2020  Expected End Date: 03/23/2021  Recent Progress: On track  Priority: Medium  Note:   Resolving due to duplicate goal  Current Barriers:  Knowledge deficits related to self health management of Cardiovascular Disease Management(HTN, CAD, Atrial fibrillation)  Knowledge Deficits related to Self management of Cardiovascular Disease Management(HTN, CAD, Atrial fibrillation Care Coordination needs related to disease management  in a patient with Self management of Cardiovascular Disease Management(HTN, CAD, Atrial fibrillation Chronic Disease Management support and education needs related to Self management of Cardiovascular Disease Management(HTN, CAD, Atrial fibrillation Unable to independently Self management of Cardiovascular Disease Management(HTN, CAD, Atrial fibrillation Unable to perform IADLs independently States her B/P has been good with readings around 120-130/70-80, States she is having fatigue and is taking naps during the day.  States she has been having issues with her CPAP.  States she is to have a sleep study on 02/09/21.  States she thinks some of her medications are causing her drowsiness and wonders if she needs all of these medications No reports of chest pain or shortness of breath.  Nurse Case Manager Clinical Goal(s):  patient will take all medications exactly as prescribed and will call provider for medication related questions patient will verbalize understanding of Afib Action Plan and when to call doctor patient will verbalize understanding of plan for Self management of Cardiovascular Disease Management(HTN, CAD, Atrial fibrillation patient will meet with RN Care Manager to address Self management of Cardiovascular Disease Management(HTN, CAD, Atrial  fibrillation patient will take all medications exactly as prescribed and will call provider for medication related questions patient will attend all scheduled medical appointments: no upcoming primary care provider scheduled, sleep study 02/11/21, GI 03/02/21, Dr. [name] 02/28/21 neurology 05/18/21 patient will verbalize basic understanding of Self management of Cardiovascular Disease Management(HTN, CAD, Atrial fibrillation disease process and self health management plan as evidenced by verbalizing understanding of education, voiced adherence to treatment plan the patient will demonstrate ongoing self health care management ability as evidenced by voiced understanding of treatment plan and decrease in hospitalizations * Interventions:  Collaboration with [name], NP regarding development and update of comprehensive plan of care as evidenced by provider attestation and co-signature Inter-disciplinary care team collaboration (see longitudinal plan of care) Basic overview and discussion of HTN, CAD, Atrial fibrillation Medications reviewed Afib action plan reviewed Evaluation of current treatment plan related to Self management of Cardiovascular Disease Management(HTN, CAD, Atrial fibrillation and patient's adherence to plan as established by provider. Reinforced to discuss issues with Dr. Brett Fairy concerning her CPAP Reviewed education  re: HTN, CAD, Atrial fibrillation Reviewed medications with patient and discussed adherence Reinforced to monitor blood pressure daily and record, calling provider for findings outside established parameters.  Discussed plans with patient for ongoing care management follow up and provided patient with direct contact information for care management team Referral to CCM PharmD to review medications and issues with polypharmacy  Self-Care Activities: Self administers medications as prescribed Attends all scheduled provider appointments Calls pharmacy for  medication refills Calls provider office for new concerns or questions Patient Goals: - begin a symptom diary - make a plan to eat healthy - keep all lab appointments - take medicine as prescribed - check blood pressure  3 times per week - choose a place to take my blood pressure (home, clinic or office, retail store) - write blood pressure results in a log or diary Follow Up Plan: Telephone follow up appointment with care management team member scheduled for: 03/09/21 at 11:30 AM The patient has been provided with contact information for the care management team and has been advised to call with any health related questions or concerns.      Patient Care Plan: CCM Pharmacy Care Plan     Problem Identified: Problem: Hypertension, Hyperlipidemia, Diabetes, Atrial Fibrillation, Coronary Artery Disease, GERD, Hypothyroidism, Overactive Bladder, and Neuropathy      Long-Range Goal: Patient-Specific Goal   Start Date: 02/20/2021  Expected End Date: 02/20/2022  Recent Progress: On track  Priority: High  Note:   Current Barriers:  Unable to independently monitor therapeutic efficacy Unable to maintain control of overactive bladder Suboptimal therapeutic regimen for diabetes  Pharmacist Clinical Goal(s):  Patient will achieve adherence to monitoring guidelines and medication adherence to achieve therapeutic efficacy maintain control of diabetes as evidenced by A1c  through collaboration with PharmD and provider.   Interventions: 1:1 collaboration with [name], NP regarding development and update of comprehensive plan of care as evidenced by provider attestation and co-signature Inter-disciplinary care team collaboration (see longitudinal plan of care) Comprehensive medication review performed; medication list updated in electronic medical record  Hypertension (BP goal <130/80) -Controlled -Current treatment: Losartan 50 mg 1 tablet daily - appropriate, effective, safe,  accessible Diltiazem 120 mg 1 capsule daily - appropriate, effective, safe, accessible -Medications previously tried: n/a -Current home readings: 135/70 (normal - that his high for her) - 110-120/70 (wrist cuff) -Current dietary habits: not a salt person; cooking at home; pre-packed vegetables (no canned vegetables) -Current exercise habits: not been able to exercise because she cannot walk with knees and back -Denies hypotensive/hypertensive symptoms -Educated on Importance of home blood pressure monitoring; Proper BP monitoring technique; Symptoms of hypotension and importance of maintaining adequate hydration; -Counseled to monitor BP at home weekly, document, and provide log at future appointments -Counseled on diet and exercise extensively Recommended to continue current medication Recommended purchasing an arm blood pressure cuff to ensure accuracy.  Hyperlipidemia: (LDL goal < 70) -Uncontrolled -Current treatment: Repatha inject every 14 day - appropriate, query effective -Medications previously tried: statins (muscle aches), Zetia -Current dietary patterns: did not discuss -Current exercise habits: unable to -Educated on Cholesterol goals;  -Counseled on diet and exercise extensively Recommended to continue current medication  CAD/History of stroke (Goal: prevent future events) -Controlled -Current treatment  Eliquis 5 mg 1 tablet twice daily - appropriate, effective, safe, accessible Nitroglycerin 0.4 mg 1 tablet as needed - appropriate, effective, safe, accessible -Medications previously tried: aspirin  -Recommended to continue current medication Recommended checking expiration date for nitroglycerin.  Diabetes (A1c goal <7%) -Controlled (per patient report of recent A1c of 5.9%) -Current medications: Novolin N 28 units before breakfast - query appropriate Novolin R 8 units in morning and at night - query appropriate Metformin XR 500 mg 1 tablet twice daily -  appropriate, effective, safe, accessible -Medications previously tried: other insulins (switched due to cost) -Current home glucose readings fasting glucose: 109, 167, 169, 137, 162, 160 (twice a day) post prandial glucose: 152, 154, 129, 187 (right before dinner) -Reports hypoglycemic/hyperglycemic symptoms (lethargy) -Current meal patterns:  breakfast: bran cereal or 2 eggs and toast or yogurt or croissant with cheese  lunch: not taking insulin in afternoon  dinner: meat and vegetables (  varies) snacks: n/a drinks: water; stopped drinking ice brand -Current exercise: unable to -Educated on A1c and blood sugar goals; Prevention and management of hypoglycemic episodes; Benefits of routine self-monitoring of blood sugar; Carbohydrate counting and/or plate method -Counseled to check feet daily and get yearly eye exams -Counseled on diet and exercise extensively Recommended to continue current medication Recommended discussion with endo about starting a Dexcom or Freestyle Libre and switching insulins to newer and longer lasting ones (Basaglar and Humalog)  Atrial Fibrillation (Goal: prevent stroke and major bleeding) -Controlled -CHADSVASC: 9 -Current treatment: Rate control: Diltiazem 120 mg 1 capsule daily - appropriate, effective, safe, accessible Anticoagulation: Eliquis 5 mg 1 tablet twice daily - appropriate, effective, safe, query accessible -Medications previously tried: none -Home BP and HR readings: normal at home is 68 -Counseled on increased risk of stroke due to Afib and benefits of anticoagulation for stroke prevention; bleeding risk associated with Eliquis and importance of self-monitoring for signs/symptoms of bleeding; avoidance of NSAIDs due to increased bleeding risk with anticoagulants; -Recommended to continue current medication Assessed patient finances. Patient does not qualify for patient assistance. Will find out why her copay is so high.  Depression/Anxiety  (Goal: minimize symptoms) -Controlled -Current treatment: Escitalopram 20 mg 1 tablet daily - appropriate, effective, safe, accessible -Medications previously tried/failed: unknown -PHQ9: 0 -GAD7: n/a -Educated on Benefits of medication for symptom control Benefits of cognitive-behavioral therapy with or without medication -Recommended to continue current medication   Hypothyroidism (Goal: TSH 2.5-4.5) -Not ideally controlled -Current treatment  Levothyroxine 137 mcg 1 tablet every morning before breakfast - appropriate, effective, safe, accessible -Medications previously tried: none  -Recommended repeat TSH due to fatigue.  Overactive bladder (Goal: minimize symptoms) -Controlled -Current treatment  Myrbetriq 50 mg 1 tablet daily - appropriate, query effective -Medications previously tried: none  -Recommended moving to bedtime to see if this helps frequent bathroom trips overnight.  GERD/Barrett's esophagus (Goal: minimize symptoms and protect esophagus) -Not ideally controlled -Current treatment  Omeprazole 20 mg 1 capsule twice daily - stopped taking Famotidine 20 mg 1 tablet at bedtime - stopped taking -Medications previously tried: none  -Counseled on benefits of taking omeprazole with Barrett's esophagus. Recommended to restart omeprazole at least once daily.  Osteopenia (Goal prevent fractures) -Controlled -Last DEXA Scan: 04/08/2017   T-Score femoral neck: -1.8  T-Score total hip: n/a  T-Score lumbar spine: -2.0  T-Score forearm radius: n/a  10-year probability of major osteoporotic fracture: 11.3%  10-year probability of hip fracture: 2.3% -Patient is not a candidate for pharmacologic treatment -Current treatment  Vitamin D 1000 units daily -Medications previously tried: none  -Recommend [phone] units of vitamin D daily. Recommend 1200 mg of calcium daily from dietary and supplemental sources. Recommend weight-bearing and muscle strengthening exercises for  building and maintaining bone density. -Recommended repeat DEXA.  Osteoarthritis (Goal: minimize pain) -Uncontrolled -Current treatment  Acetaminophen 500 mg 2 capsules as needed -Medications previously tried: n/a  -Recommended trial of Voltaren gel for knee pain.  Health Maintenance -Vaccine gaps: influenza, COVID booster -Current therapy:  Multivitamin 1 tablet daily Vitamin B complex daily -Educated on Cost vs benefit of each product must be carefully weighed by individual consumer -Patient is satisfied with current therapy and denies issues -Recommended to continue current medication  Patient Goals/Self-Care Activities Patient will:  - check glucose at least daily, document, and provide at future appointments check blood pressure weekly, document, and provide at future appointments target a minimum of 150 minutes of moderate intensity exercise weekly  Follow Up Plan: The care management team will reach out to the patient again over the next 30 days.      Patient Care Plan: RN Care Manager Plan of Care     Problem Identified: Chronic Disease Management and Care Coordination Needs (DM2, CAD, Afib,HTN. Hx CVA )   Priority: High     Long-Range Goal: Establish Plan of Care for Chronic Disease Management Needs (DM2, CAD, Afib,HTN. Hx CVA )   Start Date: 03/09/2021  Expected End Date: 09/05/2021  Recent Progress: On track  Priority: High  Note:   Current Barriers:  Care Coordination needs related to Financial constraints related to cost of medications Chronic Disease Management support and education needs related to Atrial Fibrillation, CAD, HTN, DMII, GERD, Overactive Bladder, and hx CVA Pt states that she saw Tommi Rumps about her fall and her concerns.  States she is still hurting from her fall and wants to know why she is leaning to the left.  States she is to go to neuro PT on 04/26/21 and she hopes she can do water exercises.  States she still has chest pains at times but she  does not take NTG.  States she is to start [place] for her lipids and states her medications are still expensive.  State her CBGs have been good with AM ranges of 122-137 and PM 80-196.  States she has only had one lower reading of 66 back in November when she did not wake up from her nap soon enough.  States she does not use her walker in her home and usually leans on her husband or furniture when she moves around  [place]):  Patient will verbalize understanding of plan for management of Atrial Fibrillation, CAD, HTN, DMII, GERD, Overactive Bladder, and hx CVA as evidenced by voiced adherence to plan of care verbalize basic understanding of  Atrial Fibrillation, CAD, HTN, DMII, GERD, Overactive Bladder, and hx CVA disease process and self health management plan as evidenced by voiced understanding and teach back take all medications exactly as prescribed and will call provider for medication related questions as evidenced by dispense report and pt verbalization attend all scheduled medical appointments: GI 04/26/21 , neuro PT 04/26/21, Dr. Melvyn Novas 04/27/21, Neurology 06/07/21 as evidenced by medical records demonstrate Improved adherence to prescribed treatment plan for Atrial Fibrillation, CAD, HTN, DMII, GERD, Overactive Bladder, and hx CVA as evidenced by readings within limits, voiced adherence to plan of care continue to work with RN Care Manager to address care management and care coordination needs related to  Atrial Fibrillation, CAD, HTN, DMII, GERD, Overactive Bladder, and hx CVA as evidenced by adherence to CM Team Scheduled appointments work with pharmacist to address Financial constraints related to cost of Myrbetriq and Eliquis related toAtrial Fibrillation, CAD, HTN, DMII, GERD, Overactive Bladder, and hx CVA as evidenced by review or EMR and patient or pharmacist report through collaboration with RN Care manager, provider, and care team.   Interventions: 1:1 collaboration with primary  care provider regarding development and update of comprehensive plan of care as evidenced by provider attestation and co-signature Inter-disciplinary care team collaboration (see longitudinal plan of care) Evaluation of current treatment plan related to  self management and patient's adherence to plan as established by provider  Falls Interventions:  (Status:  Goal on track:  Yes.) Long Term Goal Advised patient of importance of notifying provider of falls Assessed for signs and symptoms of orthostatic hypotension Assessed for falls since last encounter Reviewed to use her  walker if she feels unsteady    Hypertension Interventions: (Status:  Goal on track:  Yes.) Long Term Goal Last practice recorded BP readings:  BP Readings from Last 3 Encounters:  03/02/21 118/60  12/28/20 116/74  12/27/20 126/60  Most recent eGFR/CrCl: No results found for: EGFR  No components found for: CRCL  Evaluation of current treatment plan related to hypertension self management and patient's adherence to plan as established by provider Provided education to patient re: stroke prevention, s/s of heart attack and stroke Reviewed medications with patient and discussed importance of compliance Discussed plans with patient for ongoing care management follow up and provided patient with direct contact information for care management team Advised patient, providing education and rationale, to monitor blood pressure daily and record, calling PCP for findings outside established parameters  Urinary frequency Condition stable.  Not addressed this visit. Long Term Goal Evaluation of current treatment plan related to Overactive Bladder, Financial constraints related to cost of Myrbetric  self-management and patient's adherence to plan as established by provider. Discussed plans with patient for ongoing care management follow up and provided patient with direct contact information for care management team Evaluation of  current treatment plan related to Urinary frequency and patient's adherence to plan as established by provider Discussed plans with patient for ongoing care management follow up and provided patient with direct contact information for care management team  Diabetes Interventions: (Status:  Goal on track:  Yes.) Long Term Goal Assessed patient's understanding of A1c goal: <7% Advised patient, providing education and rationale, to check cbg twice a day and record, calling provider for findings outside established parameters Review of patient status, including review of consultants reports, relevant laboratory and other test results, and medications completed Reviewed to be sure to not over sleep at nap time to avoid hypoglycemia.  Lab Results  Component Value Date   HGBA1C 7.4 (H) 09/08/2020  CAD Interventions: (Status:  Goal on track: Yes.) Long Term Goal Assessed understanding of CAD diagnosis Medications reviewed including medications utilized in CAD treatment plan Provided education on importance of blood pressure control in management of CAD Provided education on Importance of limiting foods high in cholesterol Reviewed Importance of taking all medications as prescribed Reviewed Importance of attending all scheduled provider appointments Reviewed to use NTG for chest pain as directed by provider  AFIB Interventions:  (Status:  Goal on track: Yes.) Long Term Goal   Counseled on increased risk of stroke due to Afib and benefits of anticoagulation for stroke prevention Counseled on bleeding risk associated with Eliquis and importance of self-monitoring for signs/symptoms of bleeding Counseled on seeking medical attention after a head injury or if there is blood in the urine/stool Communicated to CCM PharmD concerns about cost of Eliquis  Patient Goals/Self-Care Activities: Take all medications as prescribed Attend all scheduled provider appointments Call pharmacy for medication refills  3-7 days in advance of running out of medications Call provider office for new concerns or questions  keep appointment with eye doctor check blood sugar at prescribed times: twice daily and when you have symptoms of low or high blood sugar check pulse (heart) rate once a day make a plan to eat healthy take medicine as prescribed check blood pressure daily choose a place to take my blood pressure (home, clinic or office, retail store) call doctor for signs and symptoms of high blood pressure keep all doctor appointments eat more whole grains, fruits and vegetables, lean meats and healthy fats call for medicine refill  2 or 3 days before it runs out take all medications exactly as prescribed call doctor with any symptoms you believe are related to your medicine Follow Up Plan:  Telephone follow up appointment with care management team member scheduled for:  05/23/21 The patient has been provided with contact information for the care management team and has been advised to call with any health related questions or concerns.          Patient verbalizes understanding of instructions provided today and agrees to view in [place].  The pharmacy team will reach out to the patient again over the next 30 days.   Viona Gilmore, [facility]

## 2021-04-27 ENCOUNTER — Ambulatory Visit (INDEPENDENT_AMBULATORY_CARE_PROVIDER_SITE_OTHER): Payer: Medicare Other | Admitting: Internal Medicine

## 2021-04-27 ENCOUNTER — Encounter: Payer: Self-pay | Admitting: Internal Medicine

## 2021-04-27 ENCOUNTER — Encounter: Payer: Self-pay | Admitting: Hematology

## 2021-04-27 ENCOUNTER — Other Ambulatory Visit (HOSPITAL_BASED_OUTPATIENT_CLINIC_OR_DEPARTMENT_OTHER): Payer: Self-pay

## 2021-04-27 ENCOUNTER — Ambulatory Visit (INDEPENDENT_AMBULATORY_CARE_PROVIDER_SITE_OTHER): Payer: Medicare Other

## 2021-04-27 DIAGNOSIS — R058 Other specified cough: Secondary | ICD-10-CM

## 2021-04-27 DIAGNOSIS — M542 Cervicalgia: Secondary | ICD-10-CM | POA: Diagnosis not present

## 2021-04-27 DIAGNOSIS — R0609 Other forms of dyspnea: Secondary | ICD-10-CM

## 2021-04-27 DIAGNOSIS — Z8673 Personal history of transient ischemic attack (TIA), and cerebral infarction without residual deficits: Secondary | ICD-10-CM | POA: Diagnosis not present

## 2021-04-27 DIAGNOSIS — R059 Cough, unspecified: Secondary | ICD-10-CM | POA: Diagnosis not present

## 2021-04-27 DIAGNOSIS — R2689 Other abnormalities of gait and mobility: Secondary | ICD-10-CM | POA: Diagnosis not present

## 2021-04-27 MED ORDER — AMOXICILLIN-POT CLAVULANATE 875-125 MG PO TABS: 1.0000 | ORAL_TABLET | Freq: Two times a day (BID) | ORAL | 0 refills | Status: AC

## 2021-04-27 MED ORDER — AMOXICILLIN-POT CLAVULANATE 875-125 MG PO TABS: 1.0000 | ORAL_TABLET | Freq: Two times a day (BID) | ORAL | 0 refills | Status: DC

## 2021-04-27 NOTE — Progress Notes (Signed)
Subjective:     Patient ID: Breanna Webster, female   DOB: 1945-09-27     MRN: [id number]    Brief patient profile:  51  yowf never smoker with pnds as child allergy tested as child doesn't know details, never took shots,  outgrew completely then symptoms resumed in 90's after moved to [place] from [facility] with coughing that improved p NF late 90s  but still had drainage variably  severe no pattern as to time of year then much worse since summer 2016 with onset of cough/wheeze May 2017 referred to pulmonary clinic 10/14/2015 by Dr Betty [name].      History of Present Illness  10/14/2015 1st [facility] office visit/ Jovanni Rash   Chief Complaint  Patient presents with   Pulmonary Consult    Referred by Dr. Betty [name]. Pt c/o cough and wheezing x 5 wks. Cough is occ prod with minimal green sputum.   acute onset cough > green mucus one week p husband's uri May 2017 >  rx with abx/ prednisone improved then worse 2 days p finished but mostly productive  white mucus.  If doesn't take the codeine cough med then coughs worse at hs.  No assoc sob/ some wheeze better with saba  rec The key to effective treatment for your cough is eliminating the non-stop cycle of cough you're stuck in long enough to let your airway heal completely and then see if there is anything still making you cough once you stop the cough suppression, but this should take no more than 5 days to figure out Eliminate all coughing for 3 days with your codiene syrup and then switch delsym 2 tsp every 12 hours  Prednisone 10 mg take  4 each am x 2 days,   2 each am x 2 days,  1 each am x 2 days and stop (this is to eliminate allergies and inflammation from coughing) Protonix (pantoprazole) Take 30-60 min before first meal of the day and Pepcid 20 mg one bedtime plus chlorpheniramine 4 mg x 2 at bedtime (both available over the counter)  until cough is completely gone for at least a week without the need for cough suppression GERD  diet    11/02/2015 acute extended ov/Brinson Tozzi re: cough since may 2017   Chief Complaint  Patient presents with   Pulmonary Consult    Cough has only improved some. She is still wheezing and her cough is prod with green sputum.    worse in am and at bedtime and slt green mucus again p initially turning white  rec Please see patient coordinator before you leave today  to schedule sinus CT and we will call results  For drainage / throat tickle try take CHLORPHENIRAMINE  4 mg - take one every 4 hours as needed -  Please remember to go to the lab  department downstairs for your tests - we will call you with the results when they are available. Until cough gone 100% with no need for any cough medicine > prilosec or pantoprazole 40 mg proton pump inhibitor and continue pepcid 20 mg at hs Take delsym two tsp every 12 hours and supplement if needed with tylenol #3  up to 2 every 4 hours to suppress the urge to cough.  Once you have eliminated the cough for 3 straight days try reducing the tylenol #3  first,  then the delsym as tolerated.   Stop toprol (lopressor/metaprolol)  And start bisoprolol 5 mg one daily  12/13/2015  f/u ov/Orean Giarratano re: cyclical cough/ gerd Chief Complaint  Patient presents with   Follow-up    Cough has resolved completely and no more wheezing for the past month. No new co's.    no cough at all/ on no gerd meds/ on bisoprolol instead of metaprolol and doing great  rec Remember at the onset of any respiratory complaint :  Try prilosec otc 40m  Take 30-60 min before first meal of the day and Pepcid ac (famotidine) 20 mg one @  bedtime until completely better for a week For drainage / throat tickle try take CHLORPHENIRAMINE  4 mg - take one every 4 hours as needed -          07/19/2016 acute extended ov/Winifred Balogh re: sob no longer on gerd rx "I don't have any HB"  Chief Complaint  Patient presents with   Acute Visit    Increased SOB x 3 months. She states that she gets SOB  with any exertion at all.    new problem =  Sob with exertion lifelong always the last one off the playground and trouble with steps and hills as long as she can recall Baseline = walking up to 45 min flat at [employer] [facility] s stopping  and 30 min on bicycle at "moderate pace and resistance"  Took some time off due to back pain and now can't walk 5 min with back pain and sob with back pain so severe today in w/c and pain shooting down R leg to foot s/p failed injections so far Never on inhalers  rec Nexium 40 (or prilosec 20 x 2) Take 30- 60 min before your first and last meals of the day  GERD  Diet     03/20/17 EGD [name]:  Loose NF, Barrett's    11/11/2017  Extended f/u ov/Branson Kranz re: doe/ recurrent cough on gerd rx  Chief Complaint  Patient presents with   Follow-up    Pt states she still has the cough. Pt states she will have spasms that will bring on her cough. Pt also has complaints of SOB with exertion and has chest tightness.  Dyspnea:  MMRC3 = can't walk 100 yards even at a slow pace at a flat grade s stopping due to sob  - only able to shop by pushing cart around "anywhere" but with freq stops  Cough: dry sporadic daytime / eating and talking seem to trigger it mostly  rec  To get the most out of exercise, you need to be continuously aware that you are short of breath, but never out of breath, for 30 minutes daily. As you improve, it will actually be easier for you to do the same amount of exercise  in  30 minutes so always push to the level where you are short of breath.    We will arrange a cpst in one month no sooner and I will call you with the results needs repeat echo prior to  CPST >  Pos AS     W/u for AS  02/20/18 LHC/RHC  Mean gradient  18 with wedge 18  and mod PH wih PVR 294 > ONO RA ok/ v/q nl     03/13/2018  Extended  f/u ov/Kristle Wesch re: doe/ chronic cough? ? PSpencer  Chief Complaint  Patient presents with   Follow-up    Cough still present and  getting worse - productive clear most of the time.   Dyspnea:  One half block then  has to stop due to cough and sob  Cough: daytime / variable during the day,  Up to a tbsp clear/ gag but no vomit Sleeping: flat with 2 pillows  SABA use: none 02: no  Records show best response was to cyclical cough regimen which has never been repeated since 11/02/15  rec If you are asked to stop the aspirin for any reason check with Dr Tamala Julian There is no pulmonary reason you cannot have dental surgery at this point For drainage / throat tickle try take CHLORPHENIRAMINE  4 mg (chlortabs 4 mg from [facility]) - take one every 4 hours as needed - available over the counter- may cause drowsiness so start with just a bedtime dose or two and see how you tolerate it before trying in daytime. GERD (REFLUX)   Please schedule a follow up office visit in 6 weeks, call sooner if needed with all medications /inhalers/ solutions in hand so we can verify exactly what you are taking. This includes all medications from all doctors and over the counters  - add full pfts on return - move up to 2 weeks and add tyl #3 in meantime if still can't stop coughing   04/08/2018  f/u ov/Cleaster Shiffer re:  Doe  Chief Complaint  Patient presents with   Follow-up    PFT done today. Her breathing has improved and she is coughing less but is not back to her normal baseline.   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 The left ventricle has normal systolic function, with an ejection fraction of 60-65%. The cavity size was normal. There is moderately increased left ventricular wall thickness. Left ventricular diastolic  Doppler parameters are consistent with  impaired relaxation. Elevated left ventricular end-diastolic pressure. Nl LA GI diastolic dysfunction   2. The right ventricle has normal systolc function. The cavity was normal. There is no increase in right ventricular wall thickness. Right ventricular systolic pressure could not be assessed.  3. The mitral valve is degenerative. Mild thickening of the mitral valve leaflet. There is severe mitral annular calcification present. Mild mitral valve stenosis.  4. Pulmonic valve regurgitation was not assessed by color flow Doppler.  5. - TAVR: S/P TAVR that appears to be functioning normally. There is no perivavluarl AR. The mean AVG is normal at 35mHg. LVOT/AV VTI ratio:0.54. AV Vmax:279.00 cm/s.    02/11/2019  extended f/u ov/Diara Chaudhari re: doe "I'm no better since surgery"  - did not bring meds, has high tsh not being followed in epic and balance issues since CVA Chief Complaint  Patient presents with   Follow-up    Pt states she had a stroke in May 2020 and she gets SOB doing PT.   Dyspnea:  Walking x 15 min slow pace (not prior to surgery could walk "maybe a block") Cough: gone completely  Sleeping: 6 in bedblock no resp symptoms but insomnia p elavil and one h1  SABA use: none 02: none  rec Ok to try chlorpheniramine 4 mg x 2 at bedtime  Monitor your 02 level at peak exercise by  pulse oximetry when available  Please remember to go to the  x-ray department  for your tests - we will call you with the results when they are available   Please schedule a follow up visit in 3 months but call sooner if needed  with all medications /inhalers/ solutions in hand so we can verify exactly what you are taking. This includes all medications from all doctors and over the counters   [place] eval 05/22/19  : her dyspnea is due to severe impairment of diastolic inflow into the left heart in the setting of severe LVH and calcific mitral valve disease. Her cirrhosis and anemia  causing high output are also contributing to her symptoms of shortness of breath. Optimizing her medical regimen would be beneficial  05/26/2019  f/u ov/Brayley Mackowiak re: doe  Chief Complaint  Patient presents with   Follow-up    Breathing has been progressively worse since the last visit here.   Dyspnea:  Anything more than slow paced activity makes her sob ? Becoming "cardiac criple"  Cough: none Sleeping:    6 in bed blocks  SABA use: none  02: none  New intermittent discomfort radiates L to R and never supine across upper abd no pleuritic or ex component - did not mention this to cards 05/22/19  Rec Classic subdiaphragmatic pain pattern suggests ibs: F/u prn   Rx by NP 03/13/21 Seen today for acute bronchitis symptoms. CXR showed mild bilateral interstitial thickening which can be seen with this. Sending in RX for antibiotics and low dose prednisone d.t productive/purulent cough. We will also check labs d/t vascular congestion seen on CXR which can be from heart failure. You will be getting echocardiogram >>>  ok 03/21/21 x LAE mod   Recommendations: - Doxycycline 1 tablet twice daily x 7 days - Prednisone 58m x 5 days; then 531mx 5 days; stop  - Start Mucinex [id number] a day for 7-10 days or until cough improved (over the counter) - Start Delsym cough syrup twice a day for 7-10 days or until cough improved (over the counter) >>>  gradually better  04/05/21 to ER p fell > 2 days after started hurting midline and to R worse with coughing    04/27/2021  acute ov/Tennelle Taflinger re: cough/sob p fell   maint on ppi bid ac  Chief Complaint  Patient presents with   Acute Visit    Pt c/o pain with deep breath after recent fall. She also c/o cough with green sputum.   Dyspnea:  slowed down by knees > sob  Cough: mornings green mucus  Sleeping: head of bed up 5 inches / no longer using cpap  SABA use: none  02: none  Covid status:   vax x 4  Cp gradually improving since fell, still notes with really  deep breath on R ant cw   No obvious triggers or  day to day or daytime variability or assoc  mucus plugs or hemoptysis or chest tightness, subjective wheeze or overt sinus or hb symptoms.   Sleeping as above without nocturnal   exacerbation  of respiratory  c/o's or need for noct saba. Also denies any obvious fluctuation of symptoms with weather or environmental changes or other aggravating or alleviating factors except as outlined above   No unusual exposure hx or h/o childhood pna/ asthma or knowledge of premature birth.  Current Allergies, Complete Past Medical History, Past Surgical History, Family History, and Social History were reviewed in [employer]  record.  ROS  The following are not active complaints unless bolded Hoarseness, sore throat, dysphagia, dental problems, itching, sneezing,  nasal congestion or discharge of excess mucus or purulent secretions, ear ache,   fever, chills, sweats, unintended wt loss or wt gain, classically  exertional cp,  orthopnea pnd or arm/hand swelling  or leg swelling, presyncope, palpitations, abdominal pain, anorexia, nausea, vomiting, diarrhea  or change in bowel habits or change in bladder habits, change in stools or change in urine, dysuria, hematuria,  rash, arthralgias, visual complaints, headache, numbness, weakness or ataxia or problems with walking or coordination,  change in mood or  memory.        Current Meds  Medication Sig   amitriptyline (ELAVIL) 25 MG tablet Take 1 tablet (25 mg total) by mouth at bedtime.   b complex vitamins capsule Take 1 capsule by mouth daily.   BD PEN NEEDLE NANO 2ND GEN 32G X 4 MM MISC    cholecalciferol (VITAMIN D) 25 MCG (1000 UNIT) tablet Take 1,000 Units by mouth daily.   diltiazem (CARDIZEM CD) 120 MG 24 hr capsule TAKE ONE CAPSULE BY MOUTH DAILY   ELIQUIS 5 MG TABS tablet TAKE ONE TABLET BY MOUTH TWICE A DAY   escitalopram (LEXAPRO) 20 MG tablet TAKE ONE TABLET BY MOUTH DAILY    estradiol (ESTRACE) 0.1 MG/GM vaginal cream Place vaginally.   Evolocumab (REPATHA SURECLICK) 212 MG/ML SOAJ Inject 1 pen into the skin every 14 (fourteen) days.   Lancets (ONETOUCH DELICA PLUS [id number]) MISC USE TO CHECK BLOOD SUGAR TWICE DAILY.Marland Kitchen   levothyroxine (SYNTHROID) 137 MCG tablet Take 137 mcg by mouth once.   losartan (COZAAR) 50 MG tablet TAKE ONE TABLET BY MOUTH DAILY   metFORMIN (GLUCOPHAGE-XR) 500 MG 24 hr tablet Take 500 mg by mouth 2 (two) times daily.   mirabegron ER (MYRBETRIQ) 50 MG TB24 tablet Take 50 mg by mouth daily.   Multiple Vitamin (MULITIVITAMIN WITH MINERALS) TABS Take 1 tablet by mouth daily.   nitroGLYCERIN (NITROSTAT) 0.4 MG SL tablet Place 1 tablet (0.4 mg total) under the tongue every 5 (five) minutes as needed for chest pain.   NOVOLIN N 100 UNIT/ML injection Inject 28 Units into the skin daily before breakfast.   NOVOLIN R 100 UNIT/ML injection 8 Units 2 (two) times daily before a meal. 10 UNITS THIRTY MINUTES BEFORE MEALS.  5 ADDITIONAL UNITS WITH CARBS OR SNACKS. (04/26/2021:  Pt reports she only takes the 8 units 2x/daily before meals, not the 10 units)   omeprazole (PRILOSEC) 20 MG capsule Take 1 capsule (20 mg total) by mouth 2 (two) times daily before a meal.   OneTouch Delica Lancets 04U MISC Tests BS 2 times   ONETOUCH VERIO test strip USE TO MONITOR GLUCOSE LEVELS TWICE DAILY   ULTICARE INSULIN SYRINGE 31G X 5/16" 1 ML MISC FOUR TIMES A DAY                            Objective:   Physical Exam      04/27/2021          176  05/26/2019          192  02/11/2019      192  04/08/2018      204  03/13/2018      202  11/11/2017        204  07/19/2016        196 per pt  12/13/2015  193   11/02/2015       193  10/14/15 192 lb (87.091 kg)  10/10/15 190 lb (86.183 kg)  10/07/15 189 lb 4 oz (85.843 kg)     Vital signs reviewed  04/27/2021  - Note at rest 02 sats  98% on RA   General appearance:    anxious amb wf nad   HEENT : pt wearing mask  not removed for exam due to covid -19 concerns.    NECK :  without JVD/Nodes/TM/ nl carotid upstrokes bilaterally   LUNGS: no acc muscle use,  Nl contour chest which is clear to A and P bilaterally without cough on insp or exp maneuvers   CV:  RRR  no s3 or murmur or increase in P2, and no edema   ABD:  soft and nontender with nl inspiratory excursion in the supine position. No bruits or organomegaly appreciated, bowel sounds nl  MS:  Nl gait/ ext warm without deformities, calf tenderness, cyanosis or clubbing No obvious joint restrictions   SKIN: warm and dry without lesions    NEURO:  alert, approp, nl sensorium with  no motor or cerebellar deficits apparent.    CXR PA and Lateral:   04/27/2021 :    I personally reviewed images and impression is as follows:     No acute changes/ no obvious effusion or rib fx or as dz                    Assessment:

## 2021-04-27 NOTE — Patient Instructions (Addendum)
Take both prilosec (omeprazole)  20 mg (total of 40 mg) Take 30-60 min before first meal of the day and pepcid(famotidine) 20 mg one after supper   For cough mucinex dm 1200 mg every 12 hours as needed  GERD (REFLUX)  is an extremely common cause of respiratory symptoms just like yours , many times with no obvious heartburn at all.    It can be treated with medication, but also with lifestyle changes including elevation of the head of your bed (ideally with 6 -8inch blocks under the headboard of your bed),  Smoking cessation, avoidance of late meals, excessive alcohol, and avoid fatty foods, chocolate, peppermint, colas, red wine, and acidic juices such as orange juice.  NO MINT OR MENTHOL PRODUCTS SO NO COUGH DROPS  USE SUGARLESS CANDY INSTEAD (Jolley ranchers or Stover's or Life Savers) or even ice chips will also do - the key is to swallow to prevent all throat clearing. NO OIL BASED VITAMINS - use powdered substitutes.  Avoid fish oil when coughing.   Please remember to go to the  x-ray department  for your tests - we will call you with the results when they are available    Augmentin 875 mg take one pill twice daily  X 10 days - take at breakfast and supper with large glass of water.  It would help reduce the usual side effects (diarrhea and yeast infections) if you ate cultured yogurt at lunch.   Return to see me 4 weeks if not back to your normal self

## 2021-04-27 NOTE — Progress Notes (Signed)
Agree with assessment as outlined with the following thoughts: Patient has a dx of thallasemia minor and has seen Hematology in the past. She had one episode of IDA a few years ago but mostly her iron studies have been normal in the setting of significant microcyotosis. We can repeat her TIBC / ferritin to make sure stable but don't think she needs to see hematology again for this given she has already had the evaluation. Thanks

## 2021-04-28 ENCOUNTER — Encounter: Payer: Self-pay | Admitting: Hematology

## 2021-04-28 ENCOUNTER — Encounter: Payer: Self-pay | Admitting: Internal Medicine

## 2021-04-28 ENCOUNTER — Other Ambulatory Visit: Payer: Self-pay | Admitting: Adult Health

## 2021-04-28 ENCOUNTER — Other Ambulatory Visit (HOSPITAL_BASED_OUTPATIENT_CLINIC_OR_DEPARTMENT_OTHER): Payer: Self-pay

## 2021-04-28 ENCOUNTER — Telehealth: Payer: Self-pay | Admitting: Pharmacist

## 2021-04-28 DIAGNOSIS — Z760 Encounter for issue of repeat prescription: Secondary | ICD-10-CM

## 2021-04-28 MED ORDER — ONETOUCH DELICA PLUS LANCET33G MISC
0 refills | Status: DC
  Filled 2021-04-28: qty 200, 100d supply, fill #0

## 2021-04-28 MED ORDER — FAMOTIDINE 20 MG PO TABS
ORAL_TABLET | ORAL | 0 refills | Status: DC
  Filled 2021-04-28: qty 30, 30d supply, fill #0

## 2021-04-28 MED ORDER — ONETOUCH DELICA PLUS LANCET33G MISC
1 refills | Status: DC
  Filled 2021-04-28: qty 200, 100d supply, fill #0

## 2021-04-28 MED ORDER — MIRABEGRON ER 50 MG PO TB24
ORAL_TABLET | ORAL | 11 refills | Status: DC
  Filled 2021-04-28 – 2021-06-20 (×2): qty 30, 30d supply, fill #0

## 2021-04-28 MED ORDER — INSULIN PEN NEEDLE 32G X 4 MM MISC
2 refills | Status: DC
  Filled 2021-04-28: qty 100, 25d supply, fill #0

## 2021-04-28 MED ORDER — ESCITALOPRAM OXALATE 20 MG PO TABS
ORAL_TABLET | ORAL | 1 refills | Status: DC
  Filled 2021-04-28: qty 90, 90d supply, fill #0

## 2021-04-28 MED ORDER — AMOXICILLIN-POT CLAVULANATE 875-125 MG PO TABS
ORAL_TABLET | ORAL | 0 refills | Status: DC
  Filled 2021-04-28: qty 20, 10d supply, fill #0

## 2021-04-28 MED ORDER — ESTRADIOL 0.1 MG/GM VA CREA
TOPICAL_CREAM | VAGINAL | 3 refills | Status: DC
  Filled 2021-04-28: qty 42.5, 30d supply, fill #0

## 2021-04-28 MED ORDER — GLUCOSE BLOOD VI STRP
ORAL_STRIP | 3 refills | Status: DC
  Filled 2021-04-28: qty 200, 66d supply, fill #0

## 2021-04-28 MED ORDER — LEVOTHYROXINE SODIUM 137 MCG PO TABS
ORAL_TABLET | ORAL | 3 refills | Status: DC
Start: 2021-04-18 — End: 2021-08-14
  Filled 2021-04-28: qty 30, 30d supply, fill #0

## 2021-04-28 MED ORDER — DILTIAZEM HCL ER COATED BEADS 120 MG PO CP24
120.0000 mg | ORAL_CAPSULE | Freq: Every day | ORAL | 1 refills | Status: DC
  Filled 2021-04-28: qty 90, 90d supply, fill #0

## 2021-04-28 MED ORDER — LEVOTHYROXINE SODIUM 125 MCG PO TABS
ORAL_TABLET | ORAL | 7 refills | Status: DC
  Filled 2021-04-28: qty 30, 30d supply, fill #0
  Filled 2021-08-14: qty 90, 90d supply, fill #0

## 2021-04-28 MED ORDER — METFORMIN HCL ER 500 MG PO TB24
ORAL_TABLET | ORAL | 2 refills | Status: DC
  Filled 2021-04-28 – 2021-07-17 (×2): qty 180, 90d supply, fill #0

## 2021-04-28 MED ORDER — "INSULIN SYRINGE-NEEDLE U-100 31G X 5/16"" 1 ML MISC"
1 refills | Status: DC
  Filled 2021-04-28: qty 200, 50d supply, fill #0
  Filled 2021-09-20: qty 200, 50d supply, fill #1

## 2021-04-28 MED ORDER — OMEPRAZOLE 20 MG PO CPDR
DELAYED_RELEASE_CAPSULE | ORAL | 1 refills | Status: DC
  Filled 2021-04-28 (×2): qty 180, 90d supply, fill #0

## 2021-04-28 MED FILL — Losartan Potassium Tab 50 MG: ORAL | 90 days supply | Qty: 90 | Fill #0 | Status: CN

## 2021-04-28 MED FILL — Apixaban Tab 5 MG: ORAL | 30 days supply | Qty: 60 | Fill #0 | Status: CN

## 2021-04-28 MED FILL — Escitalopram Oxalate Tab 20 MG (Base Equiv): ORAL | 90 days supply | Qty: 90 | Fill #0 | Status: CN

## 2021-04-28 NOTE — Assessment & Plan Note (Signed)
Cyclical cough rx 4/85/9276 >>> repeat 11/03/2015 as did not follow instructions the first time > resolved  - FENO 11/02/2015  =   6 - Allergy profile 11/02/2015 >  Eos 0.1 /  IgE  137 dust only  - Sinus CT 11/09/2015 > Mucosal thickening inferior right maxillary antrum. Visualized paranasal sinuses elsewhere clear. - 12/13/2015 all better off all meds p cyclical cough rx/ gerd rx  - Allergy profile 07/19/16 >  Eos 0.2 /  IgE  192 RAST pos dust only 03/20/17 EGD [name]:  Loose NF, Barrett's   - 03/03/18 nl manometry and Ph impedence studies  - reports resolved as of 02/11/2019  On max gerd rx   Recurrent flare rec augmentin as she has purulent am sputum and   max rx for gerd with f/u here if not back to 100% in 4 weeks          Each maintenance medication was reviewed in detail including emphasizing most importantly the difference between maintenance and prns and under what circumstances the prns are to be triggered using an action plan format where appropriate.  Total time for H and P, chart review, counseling,   and generating customized AVS unique to this acute office visit / same day charting = 32

## 2021-04-28 NOTE — Progress Notes (Signed)
Beth, I did not see that patient previously saw hematology 01/2020 and was diagnosed with Thalassemia but Dr. Havery Moros brought that to my attention.   Pls call the patient and let her know we would like to check her iron levels just to make sure she hasn't developed iron deficiency. Pls send her to our lab in the next 2 to 4 weeks to have an iron panel done. Thx

## 2021-04-28 NOTE — Assessment & Plan Note (Addendum)
Onset 04/2006  - [facility] 02/20/18  PA mean = 41 with wedge 18 / CO 6.24  So PVR = 294 (nl < 250)  V/q lung scan neg for PE and not suggestive of PH  - 03/13/2018   Walked RA  2 laps @210  ft each stopped due to end of study, mod fast pace, min sob, no desats, started coughing at end PFT's  04/08/2018  FEV1 2.00 (89 % ) ratio 82  p 1 % improvement from saba p nothing  prior to study with DLCO  52 % corrects to 76  % for alv volume  And no significant curvature   -S/p TAVR 06/2018  - MV endocarditis 09/2018   Echo 12/02/2018 1. The left ventricle has normal systolic function, with an ejection fraction of 60-65%. The cavity size was normal. There is moderately increased left ventricular wall thickness. Left ventricular diastolic Doppler parameters are consistent with  impaired relaxation. Elevated left ventricular end-diastolic pressure. Nl LA GI diastolic dysfunction   2. The right ventricle has normal systolc function. The cavity was normal. There is no increase in right ventricular wall thickness. Right ventricular systolic pressure could not be assessed.  3. The mitral valve is degenerative. Mild thickening of the mitral valve leaflet. There is severe mitral annular calcification present. Mild mitral valve stenosis.  4. Pulmonic valve regurgitation was not assessed by color flow Doppler.  5. - TAVR: S/P TAVR that appears to be functioning normally. There is no perivavluarl AR. The mean AVG is normal at 42mHg. LVOT/AV VTI ratio:0.54. AV Vmax:279.00 cm/s. - 02/11/2019   Walked RA  2 laps @  approx 2557feach @ slow  pace  stopped due to end of study, sob but sats still 100%   - [facility] second opinion 05/22/19 :her dyspnea is due to severe impairment of diastolic inflow into the left heart in the setting of severe LVH and calcific mitral valve disease. Her cirrhosis and anemia causing high output are also contributing to her symptoms of shortness of breath. Optimizing her medical regimen would be  beneficial   04/27/2021   Walked on RA  x  One   lap(s) =  approx 250  ft  @ slow pace holding on to wall due to balance, min sob with lowest 02 sats 94%   Clearly more limited at this point by neuro and ortho issues than breathing but should she develop more of a doe pattern limiting her would more likely be cardiac/ deconditioning  than a pulmonary issue  as has been the case in the past

## 2021-04-28 NOTE — Progress Notes (Signed)
Chronic Care Management Pharmacy Assistant   Name: Breanna Webster  MRN: [id number] DOB: 25-Dec-1945   Reason for Encounter: Drug cost comparison per MP   Recent office visits:  None  Recent consult visits:  04/27/21 Tanda Rockers, MD (Pulmonology) - Patient presented for Upper airway cough syndrome and other concerns.Prescribed Amoxicillin- [place] [facility] visits:  None in previous 6 months  Medications: Outpatient Encounter Medications as of 04/28/2021  Medication Sig   amitriptyline (ELAVIL) 25 MG tablet Take 1 tablet (25 mg total) by mouth at bedtime.   amoxicillin-clavulanate (AUGMENTIN) 875-125 MG tablet Take 1 tablet by mouth 2 (two) times daily for 10 days.   amoxicillin-clavulanate (AUGMENTIN) 875-125 MG tablet Take 1 tablet by mouth twice daily for 10 days   apixaban (ELIQUIS) 5 MG TABS tablet TAKE ONE TABLET BY MOUTH TWICE A DAY   b complex vitamins capsule Take 1 capsule by mouth daily.   BD PEN NEEDLE NANO 2ND GEN 32G X 4 MM MISC    cholecalciferol (VITAMIN D) 25 MCG (1000 UNIT) tablet Take 1,000 Units by mouth daily.   diltiazem (CARDIZEM CD) 120 MG 24 hr capsule TAKE ONE CAPSULE BY MOUTH DAILY   escitalopram (LEXAPRO) 20 MG tablet TAKE ONE TABLET BY MOUTH DAILY   estradiol (ESTRACE) 0.1 MG/GM vaginal cream Place vaginally.   estradiol (ESTRACE) 0.1 MG/GM vaginal cream Place 1 applicatorful into the vagina 2 nights per week   Evolocumab (REPATHA SURECLICK) 532 MG/ML SOAJ Inject 1 dose into the skin every 14 (fourteen) days.   famotidine (PEPCID) 20 MG tablet Take 1 tablet by mouth every night at bedtime   glucose blood test strip Use to check blood sugar 3 times daily   Insulin Pen Needle 32G X 4 MM MISC Use to inject 4 times daily   Insulin Syringe-Needle U-100 31G X 5/16" 1 ML MISC Use for injections 4 times daily   Lancets (ONETOUCH DELICA PLUS [id number]) MISC USE TO CHECK BLOOD SUGAR TWICE DAILY.Marland Kitchen   Lancets (ONETOUCH DELICA PLUS [id number])  MISC Use 1 lancet 2 times daily   Lancets (ONETOUCH DELICA PLUS [id number]) MISC Use 1 lancet 2 times daily   levothyroxine (SYNTHROID) 125 MCG tablet Take 1 tablet by mouth every morning on an empty stomach   levothyroxine (SYNTHROID) 137 MCG tablet Take 137 mcg by mouth once.   levothyroxine (SYNTHROID) 137 MCG tablet Take 1 tablet by mouth every morning on an empty stomach   losartan (COZAAR) 50 MG tablet TAKE ONE TABLET BY MOUTH DAILY   metFORMIN (GLUCOPHAGE-XR) 500 MG 24 hr tablet Take 500 mg by mouth 2 (two) times daily.   metFORMIN (GLUCOPHAGE-XR) 500 MG 24 hr tablet Take 1 tablet by mouth 2 times daily   mirabegron ER (MYRBETRIQ) 50 MG TB24 tablet Take 1 tablet by mouth once daily.   mirabegron ER (MYRBETRIQ) 50 MG TB24 tablet Take 50 mg by mouth daily.   Multiple Vitamin (MULITIVITAMIN WITH MINERALS) TABS Take 1 tablet by mouth daily.   nitroGLYCERIN (NITROSTAT) 0.4 MG SL tablet Place 1 tablet (0.4 mg total) under the tongue every 5 (five) minutes as needed for chest pain.   NOVOLIN N 100 UNIT/ML injection Inject 28 Units into the skin daily before breakfast.   NOVOLIN R 100 UNIT/ML injection 8 Units 2 (two) times daily before a meal. 10 UNITS THIRTY MINUTES BEFORE MEALS.  5 ADDITIONAL UNITS WITH CARBS OR SNACKS. (04/26/2021:  Pt reports she only takes the 8 units 2x/daily before  meals, not the 10 units)   omeprazole (PRILOSEC) 20 MG capsule Take 1 capsule (20 mg total) by mouth 2 (two) times daily before a meal.   omeprazole (PRILOSEC) 20 MG capsule Take 1 capsule by mouth twice daily before meals   OneTouch Delica Lancets 40J MISC Tests BS 2 times   ONETOUCH VERIO test strip USE TO MONITOR GLUCOSE LEVELS TWICE DAILY   ULTICARE INSULIN SYRINGE 31G X 5/16" 1 ML MISC FOUR TIMES A DAY   No facility-administered encounter medications on file as of 04/28/2021.  Per Watt Climes Call to patients 2 prescription plans to see cost of Eliquis (Current Dose) VS Xarelto (unspecified Dose)  Call to  [facility] was advised patient  cost for Mailorder for Eliquis 90 DS would be 386.00 30 DS at a local pharmacy would be 105.00. They have Xarelto in 2.5/10 or 20 mg if the dose was 2.5 mg 2x a day cost for 90 DS mail order would be 355.00 and a 60 DS would be 124.00. I was advised they are a Medicare Part D plan.   Call to [facility] Ref # I [id number]  ; [phone] [facility])  Was advised they could not provide me specific dollar amount all dependant on if she had met her medicare deductible and copays first and a 50 dollar deductible for them.   Care Gaps: BP- 118/60(04/27/21) AWV - 12/22 Foot Exam - Overdue Flu Vaccine - Overdue Lab Results  Component Value Date   HGBA1C 7.4 (H) 09/08/2020    Star Rating Drugs: Metformin (Glucophage) 500 mg - Last filled 02/17/21 90 DS at [facility] Losartan (Cozaar) 50 mg - Last filled 01/19/21 90 DS at [facility]  Patient Assistance: Novo - mailed to pt  [place] Pharmacist Assistant [phone]

## 2021-05-01 ENCOUNTER — Telehealth: Payer: Self-pay

## 2021-05-01 ENCOUNTER — Other Ambulatory Visit: Payer: Self-pay

## 2021-05-01 ENCOUNTER — Other Ambulatory Visit (HOSPITAL_BASED_OUTPATIENT_CLINIC_OR_DEPARTMENT_OTHER): Payer: Self-pay

## 2021-05-01 ENCOUNTER — Telehealth: Payer: Self-pay | Admitting: *Deleted

## 2021-05-01 DIAGNOSIS — D508 Other iron deficiency anemias: Secondary | ICD-10-CM

## 2021-05-01 NOTE — Telephone Encounter (Signed)
Called the patient. No answer. Left her a voicemail asking she return for labs in the next 2 to 4 weeks to check her iron.  Order in [place].

## 2021-05-01 NOTE — Telephone Encounter (Signed)
Received MRI cervical spine results from [facility]. Placed on [place] NP's desk for review.

## 2021-05-01 NOTE — Telephone Encounter (Signed)
Please advise patient MR spine and MR brain both negative for concerning findings. MR brain showed stable old strokes but no new strokes. Thank you.

## 2021-05-02 ENCOUNTER — Other Ambulatory Visit: Payer: Self-pay

## 2021-05-02 ENCOUNTER — Encounter: Payer: Self-pay | Admitting: Physical Therapy

## 2021-05-02 ENCOUNTER — Ambulatory Visit: Payer: Medicare Other | Admitting: Physical Therapy

## 2021-05-02 ENCOUNTER — Encounter: Payer: Self-pay | Admitting: Hematology

## 2021-05-02 ENCOUNTER — Other Ambulatory Visit (HOSPITAL_BASED_OUTPATIENT_CLINIC_OR_DEPARTMENT_OTHER): Payer: Self-pay

## 2021-05-02 DIAGNOSIS — R2689 Other abnormalities of gait and mobility: Secondary | ICD-10-CM | POA: Diagnosis not present

## 2021-05-02 DIAGNOSIS — M6281 Muscle weakness (generalized): Secondary | ICD-10-CM

## 2021-05-02 DIAGNOSIS — R2681 Unsteadiness on feet: Secondary | ICD-10-CM

## 2021-05-02 DIAGNOSIS — R29898 Other symptoms and signs involving the musculoskeletal system: Secondary | ICD-10-CM | POA: Diagnosis not present

## 2021-05-02 NOTE — Therapy (Signed)
[facility] [address] [place], [place], [address] Phone: [phone]   Fax:  [phone]  Physical Therapy Treatment  Patient Details  Name: Breanna Webster MRN: [id number] Date of Birth: 1945/09/01 Referring Provider (PT): Dohmeier, Asencion Partridge   Encounter Date: 05/02/2021   PT End of Session - 05/02/21 1234     Visit Number 2    Number of Visits 17    Date for PT Re-Evaluation 06/23/21    Authorization Type Medicare/Generic Commercial    Progress Note Due on Visit 10    PT Start Time 1105    PT Stop Time 1146    PT Time Calculation (min) 41 min    Activity Tolerance Patient tolerated treatment well    Behavior During Therapy [facility] for tasks assessed/performed             Past Medical History:  Diagnosis Date   Anxiety    Arthritis    "back" (04/22/2018)   Back pain    Blood transfusion without reported diagnosis    CAD (coronary artery disease)    a. 03/2018 s/p PCI/DES to the RCA (3.0x15 Onyx DES).   Carotid artery stenosis    Mild   Chest pain    Chronic lower back pain    Cirrhosis (HCC)    Colon polyps    Diverticulitis    Diverticulosis    Esophageal thickening    seen on pre TAVR CT scan, also questionable cirrhosis. MRI recommended. Will refer to GI   Fatty liver    GERD (gastroesophageal reflux disease)    Grave's disease    History of colonic polyps 05/22/2017   History of hiatal hernia    Hypertension    Hypothyroidism    IBS (irritable bowel syndrome)    Osteopenia    Pulmonary nodules    seen on pre TAVR CT. likley benign. no follow up recommended if pt low risk.   S/P TAVR (transcatheter aortic valve replacement)    Severe aortic stenosis    Shortness of breath on exertion    Stroke ([place])    Thalassemia minor    Thyroid disease    Type II diabetes mellitus ([place])     Past Surgical History:  Procedure Laterality Date   67 HOUR [place] STUDY N/A 03/03/2018   Procedure: 24 HOUR PH STUDY;   Surgeon: Mauri Pole, MD;  Location: [facility];  Service: Endoscopy;  Laterality: N/A;   AORTIC VALVE REPLACEMENT  06/2018   COLONOSCOPY     COLONOSCOPY W/ BIOPSIES AND POLYPECTOMY     CORONARY ANGIOGRAPHY Right 04/21/2018   Procedure: CORONARY ANGIOGRAPHY (CATH LAB);  Surgeon: [name], MD;  Location: [place] CV LAB;  Service: Cardiovascular;  Laterality: Right;   CORONARY STENT INTERVENTION N/A 04/22/2018   Procedure: CORONARY STENT INTERVENTION;  Surgeon: [name], MD;  Location: [place] CV LAB;  Service: Cardiovascular;  Laterality: N/A;   DILATION AND CURETTAGE OF UTERUS     ESOPHAGEAL MANOMETRY N/A 03/03/2018   Procedure: ESOPHAGEAL MANOMETRY (EM);  Surgeon: Mauri Pole, MD;  Location: [facility];  Service: Endoscopy;  Laterality: N/A;   GASTRIC FUNDOPLICATION     HERNIA REPAIR     HYSTEROSCOPY     fibroids   LAPAROSCOPIC CHOLECYSTECTOMY     LAPAROSCOPY     fibroids   NISSEN FUNDOPLICATION  1157W   POLYPECTOMY     RIGHT/LEFT HEART CATH AND CORONARY ANGIOGRAPHY N/A 02/20/2018  Procedure: RIGHT/LEFT HEART CATH AND CORONARY ANGIOGRAPHY;  Surgeon: [name], MD;  Location: [place] CV LAB;  Service: Cardiovascular;  Laterality: N/A;   TEE WITHOUT CARDIOVERSION N/A 07/08/2018   Procedure: TRANSESOPHAGEAL ECHOCARDIOGRAM (TEE);  Surgeon: Burnell Blanks, MD;  Location: [facility];  Service: Open Heart Surgery;  Laterality: N/A;   TEE WITHOUT CARDIOVERSION  10/07/2018   TEE WITHOUT CARDIOVERSION N/A 10/07/2018   Procedure: TRANSESOPHAGEAL ECHOCARDIOGRAM (TEE);  Surgeon: Jerline Pain, MD;  Location: [facility] ENDOSCOPY;  Service: Cardiovascular;  Laterality: N/A;   TONSILLECTOMY     TRANSCATHETER AORTIC VALVE REPLACEMENT, TRANSFEMORAL N/A 07/08/2018   Procedure: TRANSCATHETER AORTIC VALVE REPLACEMENT, TRANSFEMORAL;  Surgeon: Burnell Blanks, MD;  Location: [facility];  Service: Open Heart Surgery;  Laterality: N/A;    There were no  vitals filed for this visit.   Subjective Assessment - 05/02/21 1109     Subjective Denies falls since last session. Has had a head and neck MRI since last session and waiting for results. Has not been set up with aquatic therapy yet.    Pertinent History significant arthritis in knees    Patient Stated Goals Pt's goals for therapy are to get better with walking and with arthritis.    Currently in Pain? No/denies                               [facility] Adult PT Treatment/Exercise - 05/02/21 0001       Neuro Re-ed    Neuro Re-ed Details  R/L wt shift 20x   cues for foot positioning, proper weight shift, pacing, maintaining TKE     Exercises   Exercises Knee/Hip      Knee/Hip Exercises: Stretches   Gastroc Stretch Right;Left;2 reps;30 seconds    Gastroc Stretch Limitations runner's stretch      Knee/Hip Exercises: Aerobic   Nustep L3 x 6 min (UEs/LEs)      Knee/Hip Exercises: Standing   Hip Flexion Stengthening;Both;10 reps;3 sets    Hip Flexion Limitations march in II bars   last 2 sets without UE support     Knee/Hip Exercises: Seated   Other Seated Knee/Hip Exercises review of sitting HEP- heel/toe raises and LAQ    Sit to Sand 1 set;10 reps;without UE support   5x with airex under bottom; 5x without; corrected excessive anterior trunk lean and getting stuck in this position                    PT Education - 05/02/21 1143     Education Details edu/review of HEP-Access Code: [email or web address]; edu on benefits of AD on stability and balance confidence    Person(s) Educated Patient    Methods Explanation;Demonstration;Tactile cues;Verbal cues;Handout    Comprehension Verbalized understanding;Returned demonstration              PT Short Term Goals - 05/02/21 1235       PT SHORT TERM GOAL #1   Title Pt will be independent with HEP for improved strength and flexibility, balance, transfers, and gait.   TARGET 05/26/2021    Time 4    Period Weeks     Status On-going      PT SHORT TERM GOAL #2   Title Pt will improve Berg score to at least 41/56 to decrease fall risk.    Baseline 36/56    Time 4    Period Weeks    Status On-going  PT SHORT TERM GOAL #3   Title Pt will improve TUG score to less than or equal to 21 sec for decreased fall risk.    Baseline 26.88 sec    Time 4    Period Weeks    Status On-going      PT SHORT TERM GOAL #4   Title Pt will verbalize understanding of fall prevention in home environment.    Time 4    Period Weeks    Status On-going               PT Long Term Goals - 05/02/21 1236       PT LONG TERM GOAL #1   Title Pt will be independent with HEP for improved flexibility and strength, balance, transfers, and gait.  TARGET 06/23/2021    Time 9    Period Weeks    Status On-going      PT LONG TERM GOAL #2   Title Pt will improve Berg score to at least 46/56 to decrease fall risk.    Time 9    Period Weeks    Status On-going      PT LONG TERM GOAL #3   Title Pt will improve TUG score to less than or equal to 16 sec for decreased fall risk.    Time 9    Period Weeks    Status On-going      PT LONG TERM GOAL #4   Title Pt will improve gait velocity to at least 2 ft/sec for improved gait efficiency and safety.    Baseline 1.73 ft/sec    Time 9    Period Weeks    Status On-going      PT LONG TERM GOAL #5   Title Pt will verbalize plans for continued community fitness upon d/c to maximize gains made in PT.    Time 9    Period Weeks    Status On-going                   Plan - 05/02/21 1235     Clinical Impression Statement Patient arrived to session with report of no falls since last session. Per chart, both recent cervical and head MRI were negative. Worked on STS transfers which patient performed with ease and no c/o knee pain however required cues to correct excessive anterior trunk lean in order to avoid getting stuck in this position. Patient with considerable B calf  tightness evident with gait, thus added gastroc stretch into HEP. Patient was educated on HEP and benefits of SPC for improved balance confidence and stability, sa patient reports considerable fear of falling. Patient seems open to cane after discussion. Patient tolerated session well and without complaints at end of session.    Personal Factors and Comorbidities Comorbidity 3+    Comorbidities CVA due to embolism R PCA; COVID -19, post-viral fatigue syndrome  PMH COVID 11/2020, arthritis, back pain, CAD, cirrhosis, diverticulitis/osis, GERD, HTN, IBS, osteopenia, PAF    Examination-Activity Limitations Locomotion Level;Transfers;Stand;Caring for Others;Dressing    Examination-Participation Restrictions Meal Prep;Cleaning;Community Activity;Laundry    Stability/Clinical Decision Making Evolving/Moderate complexity    Rehab Potential Good    PT Frequency 2x / week    PT Duration 8 weeks   plus eval, so 9 wks totatl including eval week; will likely try for 1x/wk pool and 1x/wk in-clinic   PT Treatment/Interventions ADLs/Self Care Home Management;Aquatic Therapy;DME Instruction;Neuromuscular re-education;Balance training;Therapeutic exercise;Therapeutic activities;Functional mobility training;Gait training;Patient/family education;Manual techniques    PT Next  Visit Plan Review HEP updates; Work on standing balance, gait activities.  Follow up about pool scheduling and OT eval (referral in chart)    Consulted and Agree with Plan of Care Patient             Patient will benefit from skilled therapeutic intervention in order to improve the following deficits and impairments:  Abnormal gait, Difficulty walking, Impaired flexibility, Decreased balance, Decreased mobility, Decreased strength, Postural dysfunction, Pain  Visit Diagnosis: Other abnormalities of gait and mobility  Unsteadiness on feet  Muscle weakness (generalized)  Other symptoms and signs involving the musculoskeletal  system     Problem List Patient Active Problem List   Diagnosis Date Noted   COVID-19 02/14/2021   Postviral fatigue syndrome 02/14/2021   Difficulty with adaptive servo-ventilation (ASV) use 02/14/2021   Sepsis secondary to UTI ([place]) 09/08/2020   Elevated ALT measurement 09/08/2020   History of CVA with residual deficit 09/08/2020   Hyperbilirubinemia 09/08/2020   Fatigue associated with anemia 08/03/2020   Cerebrovascular accident (CVA) due to embolism of right posterior cerebral artery ([place]) 08/03/2020   OSA treated with BiPAP 08/03/2020   Complex sleep apnea syndrome 08/03/2020   Treatment-emergent central sleep apnea 08/03/2020   Chronic intermittent hypoxia with obstructive sleep apnea 04/21/2020   OSA (obstructive sleep apnea) 04/21/2020   History of cardioembolic stroke 63/33/5456   Gait disturbance, post-stroke 03/29/2020   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 ([place]) 03/29/2020   Anxiety    RLQ abdominal pain 10/22/2019   Paroxysmal atrial fibrillation ([place]) 05/22/2019   Iron deficiency anemia 05/07/2019   Atrial fibrillation with RVR ([place]) 10/21/2018   Cerebellar stroke, acute ([place]) 10/21/2018   Streptococcal endocarditis    Endocarditis of mitral valve 10/07/2018   Bacteremia due to Streptococcus Salivarius 10/07/2018   Sepsis ([place]) 25/63/8937   Acute metabolic encephalopathy 34/28/7681   Severe aortic stenosis 07/08/2018   S/P TAVR (transcatheter aortic valve replacement) 07/08/2018   Esophageal thickening    CAD in native artery 04/22/2018   Gastroesophageal reflux disease    Pulmonary hypertension ([place]) 02/21/2018   Essential hypertension 07/15/2017   History of colonic polyps 05/22/2017   Elevated liver function tests 12/05/2016   DOE (dyspnea on exertion) 07/19/2016   Thalassemia minor 05/29/2016   Left bundle branch block 12/06/2015   Upper airway cough syndrome 15/72/6203   Diastolic heart failure ([place]) 10/10/2015   Myalgia 02/17/2014    Carotid artery stenosis 06/05/2013   Eustachian tube dysfunction 05/07/2013   Neuropathy of leg 03/07/2012   Anemia, unspecified 01/31/2012   Hot flashes 08/24/2010   Diabetes mellitus due to underlying condition, uncontrolled 06/30/2010   Generalized abdominal pain 03/24/2010   Obesity (BMI 30.0-34.9) 12/21/2009   IBS (irritable bowel syndrome) 04/29/2009   Vitamin D deficiency 03/10/2009   Acute bronchitis 03/07/2009   Hypothyroidism 12/13/2008   Dyslipidemia 12/13/2008   Anxiety state 12/13/2008   Other specified disorders of bladder 12/13/2008    Janene Harvey, PT, DPT 05/02/21 12:38 PM   [facility] [address], [place], [place], [address] Phone: [phone]   Fax:  [phone]  Name: Breanna Webster MRN: [id number] Date of Birth: December 21, 1945

## 2021-05-02 NOTE — Telephone Encounter (Signed)
Called the patient. No answer. Left a very detailed message on her voicemail of the home phone number.

## 2021-05-04 ENCOUNTER — Other Ambulatory Visit (HOSPITAL_BASED_OUTPATIENT_CLINIC_OR_DEPARTMENT_OTHER): Payer: Self-pay

## 2021-05-04 ENCOUNTER — Encounter: Payer: Self-pay | Admitting: Hematology

## 2021-05-04 ENCOUNTER — Other Ambulatory Visit: Payer: Self-pay

## 2021-05-04 DIAGNOSIS — E118 Type 2 diabetes mellitus with unspecified complications: Secondary | ICD-10-CM

## 2021-05-04 DIAGNOSIS — Z760 Encounter for issue of repeat prescription: Secondary | ICD-10-CM

## 2021-05-04 MED ORDER — "INSULIN SYRINGE-NEEDLE U-100 31G X 5/16"" 1 ML MISC"
1 refills | Status: DC
  Filled 2021-05-04: qty 200, fill #0
  Filled 2021-08-09: qty 200, 50d supply, fill #0

## 2021-05-04 MED ORDER — DILTIAZEM HCL ER COATED BEADS 120 MG PO CP24
120.0000 mg | ORAL_CAPSULE | Freq: Every day | ORAL | 1 refills | Status: DC
  Filled 2021-05-04 – 2021-06-20 (×2): qty 90, 90d supply, fill #0
  Filled 2021-09-20: qty 90, 90d supply, fill #1

## 2021-05-04 MED ORDER — AMITRIPTYLINE HCL 25 MG PO TABS
25.0000 mg | ORAL_TABLET | Freq: Every day | ORAL | 1 refills | Status: DC
  Filled 2021-05-04: qty 90, 90d supply, fill #0

## 2021-05-04 MED ORDER — LOSARTAN POTASSIUM 50 MG PO TABS
ORAL_TABLET | ORAL | 1 refills | Status: DC
  Filled 2021-05-04 – 2021-08-04 (×2): qty 90, 90d supply, fill #0
  Filled 2021-11-13: qty 90, 90d supply, fill #1

## 2021-05-04 MED ORDER — APIXABAN 5 MG PO TABS
ORAL_TABLET | ORAL | 2 refills | Status: DC
  Filled 2021-05-04 – 2021-06-12 (×3): qty 60, 30d supply, fill #0
  Filled 2021-07-17: qty 60, 30d supply, fill #1
  Filled 2021-08-14: qty 60, 30d supply, fill #2

## 2021-05-04 MED ORDER — ESCITALOPRAM OXALATE 20 MG PO TABS
ORAL_TABLET | ORAL | 1 refills | Status: DC
  Filled 2021-05-04 – 2021-06-20 (×2): qty 90, 90d supply, fill #0
  Filled 2021-09-20: qty 90, 90d supply, fill #1

## 2021-05-04 NOTE — Telephone Encounter (Signed)
Per pt spouse Mychart message "Due to a conflict between [facility] and [facility] [place] and I have had to switch to our [facility]. Please use the [facility] at [place], [place], [place], [place]. This will be the pharmacy for both Breanna and Breanna Webster for at least the next six months.

## 2021-05-04 NOTE — Telephone Encounter (Signed)
Rx refilled electronically

## 2021-05-08 ENCOUNTER — Ambulatory Visit: Payer: Medicare Other | Admitting: Physical Therapy

## 2021-05-08 ENCOUNTER — Other Ambulatory Visit: Payer: Self-pay

## 2021-05-08 DIAGNOSIS — R2689 Other abnormalities of gait and mobility: Secondary | ICD-10-CM | POA: Diagnosis not present

## 2021-05-08 DIAGNOSIS — R2681 Unsteadiness on feet: Secondary | ICD-10-CM

## 2021-05-08 DIAGNOSIS — R29898 Other symptoms and signs involving the musculoskeletal system: Secondary | ICD-10-CM | POA: Diagnosis not present

## 2021-05-08 DIAGNOSIS — M6281 Muscle weakness (generalized): Secondary | ICD-10-CM | POA: Diagnosis not present

## 2021-05-08 NOTE — Therapy (Signed)
[facility] [address] [place], [place], [address] Phone: [phone]   Fax:  [phone]  Physical Therapy Treatment  Patient Details  Name: Breanna Webster MRN: [id number] Date of Birth: [birth year] Referring Provider (PT): Dohmeier, Asencion Partridge   Encounter Date: 05/08/2021   PT End of Session - 05/08/21 1944     Visit Number 3    Number of Visits 17    Date for PT Re-Evaluation 06/23/21    Authorization Type Medicare/Generic Commercial    Progress Note Due on Visit 10    PT Start Time 1415    PT Stop Time 1500    PT Time Calculation (min) 45 min    Equipment Utilized During Treatment Other (comment)   large barbell, large yellow noodle   Activity Tolerance Patient tolerated treatment well    Behavior During Therapy [facility] for tasks assessed/performed             Past Medical History:  Diagnosis Date   Anxiety    Arthritis    "back" (04/22/2018)   Back pain    Blood transfusion without reported diagnosis    CAD (coronary artery disease)    a. 03/2018 s/p PCI/DES to the RCA (3.0x15 Onyx DES).   Carotid artery stenosis    Mild   Chest pain    Chronic lower back pain    Cirrhosis (HCC)    Colon polyps    Diverticulitis    Diverticulosis    Esophageal thickening    seen on pre TAVR CT scan, also questionable cirrhosis. MRI recommended. Will refer to GI   Fatty liver    GERD (gastroesophageal reflux disease)    Grave's disease    History of colonic polyps 05/22/2017   History of hiatal hernia    Hypertension    Hypothyroidism    IBS (irritable bowel syndrome)    Osteopenia    Pulmonary nodules    seen on pre TAVR CT. likley benign. no follow up recommended if pt low risk.   S/P TAVR (transcatheter aortic valve replacement)    Severe aortic stenosis    Shortness of breath on exertion    Stroke ([place])    Thalassemia minor    Thyroid disease    Type II diabetes mellitus ([place])     Past Surgical  History:  Procedure Laterality Date   7 HOUR [place] STUDY N/A 03/03/2018   Procedure: 24 HOUR PH STUDY;  Surgeon: Mauri Pole, MD;  Location: [facility];  Service: Endoscopy;  Laterality: N/A;   AORTIC VALVE REPLACEMENT  06/2018   COLONOSCOPY     COLONOSCOPY W/ BIOPSIES AND POLYPECTOMY     CORONARY ANGIOGRAPHY Right 04/21/2018   Procedure: CORONARY ANGIOGRAPHY (CATH LAB);  Surgeon: [name], MD;  Location: [facility] CV LAB;  Service: Cardiovascular;  Laterality: Right;   CORONARY STENT INTERVENTION N/A 04/22/2018   Procedure: CORONARY STENT INTERVENTION;  Surgeon: [name], MD;  Location: [facility] CV LAB;  Service: Cardiovascular;  Laterality: N/A;   DILATION AND CURETTAGE OF UTERUS     ESOPHAGEAL MANOMETRY N/A 03/03/2018   Procedure: ESOPHAGEAL MANOMETRY (EM);  Surgeon: Mauri Pole, MD;  Location: [facility];  Service: Endoscopy;  Laterality: N/A;   GASTRIC FUNDOPLICATION     HERNIA REPAIR     HYSTEROSCOPY     fibroids   LAPAROSCOPIC CHOLECYSTECTOMY     LAPAROSCOPY     fibroids   NISSEN FUNDOPLICATION  7858I   POLYPECTOMY  RIGHT/LEFT HEART CATH AND CORONARY ANGIOGRAPHY N/A 02/20/2018   Procedure: RIGHT/LEFT HEART CATH AND CORONARY ANGIOGRAPHY;  Surgeon: [name], MD;  Location: [facility] CV LAB;  Service: Cardiovascular;  Laterality: N/A;   TEE WITHOUT CARDIOVERSION N/A 07/08/2018   Procedure: TRANSESOPHAGEAL ECHOCARDIOGRAM (TEE);  Surgeon: Burnell Blanks, MD;  Location: [facility];  Service: Open Heart Surgery;  Laterality: N/A;   TEE WITHOUT CARDIOVERSION  10/07/2018   TEE WITHOUT CARDIOVERSION N/A 10/07/2018   Procedure: TRANSESOPHAGEAL ECHOCARDIOGRAM (TEE);  Surgeon: Jerline Pain, MD;  Location: [facility] ENDOSCOPY;  Service: Cardiovascular;  Laterality: N/A;   TONSILLECTOMY     TRANSCATHETER AORTIC VALVE REPLACEMENT, TRANSFEMORAL N/A 07/08/2018   Procedure: TRANSCATHETER AORTIC VALVE REPLACEMENT, TRANSFEMORAL;  Surgeon: Burnell Blanks, MD;  Location: [facility];  Service: Open Heart Surgery;  Laterality: N/A;    There were no vitals filed for this visit.   Subjective Assessment - 05/08/21 1942     Subjective Pt presents for aquatic therapy at [facility] - accompanied by her husband; pt states she has problems with her balance due to h/o stroke; also reports she has tremor in her LUE which is a result of the stroke    Patient is accompained by: Family member   husband   Pertinent History significant arthritis in knees    Patient Stated Goals Pt's goals for therapy are to get better with walking and with arthritis.    Currently in Pain? No/denies             Aquatic therapy at [facility] - pool temp 92 degrees  Patient seen for aquatic therapy today.  Treatment took place in water 3.5-4.5 feet deep depending upon activity.  Pt entered & exited the pool  the pool via step negotiation, using a step by step sequence with use of bil. Hand rails with SBA.  Pt performed water walking 18' x 4 reps forwards across width of pool using large yellow noodle for UE support with CGA  Pt performed sideways ambulation 18' x 4 reps and then backwards amb. X 2 reps (18') with UE support on pool noodle with CGA to min assist  Pt performed marching in place 10 reps with 1 UE support on side of pool with CGA  Pt performed sit to stand transfers from bench in pool in 3.5' water depth with CGA with UE support on large bar bell  Pt performed standing balance exercises with UE support on edge of pool prn with CGA - marching in place 10 reps each leg; forward, back and side kicks Alternating legs 10 reps each with UE support on pool edge and with CGA to SBA  Pt performed alternating tap ups to 6" aquatic step in 4.5' water depth 10 reps each leg with minimal UE support on pool edge for assistance with balance  Pt requires buoyancy of water for support and safety with standing balance exercises; exercises are able  to be safely attempted/performed in water without or with minimal UE support that cannot be performed safely on land.  Viscosity of water is needed for resistance for strengthening.  Current of water provides perturbations for challenges with standing balance.  Buoyancy of water also provides joint offloading which increases comfort and reduces pain in knees with weight bearing activities and exs in water, whereas, this is unable to be achieved with weight bearing exercises on land.  PT Short Term Goals - 05/08/21 2016       PT SHORT TERM GOAL #1   Title Pt will be independent with HEP for improved strength and flexibility, balance, transfers, and gait.   TARGET 05/26/2021    Time 4    Period Weeks    Status On-going      PT SHORT TERM GOAL #2   Title Pt will improve Berg score to at least 41/56 to decrease fall risk.    Baseline 36/56    Time 4    Period Weeks    Status On-going      PT SHORT TERM GOAL #3   Title Pt will improve TUG score to less than or equal to 21 sec for decreased fall risk.    Baseline 26.88 sec    Time 4    Period Weeks    Status On-going      PT SHORT TERM GOAL #4   Title Pt will verbalize understanding of fall prevention in home environment.    Time 4    Period Weeks    Status On-going               PT Long Term Goals - 05/08/21 2016       PT LONG TERM GOAL #1   Title Pt will be independent with HEP for improved flexibility and strength, balance, transfers, and gait.  TARGET 06/23/2021    Time 9    Period Weeks    Status On-going      PT LONG TERM GOAL #2   Title Pt will improve Berg score to at least 46/56 to decrease fall risk.    Time 9    Period Weeks    Status On-going      PT LONG TERM GOAL #3   Title Pt will improve TUG score to less than or equal to 16 sec for decreased fall risk.    Time 9    Period Weeks    Status On-going      PT LONG TERM GOAL #4   Title Pt will improve gait  velocity to at least 2 ft/sec for improved gait efficiency and safety.    Baseline 1.73 ft/sec    Time 9    Period Weeks    Status On-going      PT LONG TERM GOAL #5   Title Pt will verbalize plans for continued community fitness upon d/c to maximize gains made in PT.    Time 9    Period Weeks    Status On-going                   Plan - 05/08/21 1956     Clinical Impression Statement Aquatic therapy session focused on gait and balance training in 4' - 4.5' water depth with use of noodle for UE support with CGA to occasional min assist for balance recovery.  Pt reported the sloped pool floor made her feel very unsteady and off balance but pt's performance noted to slowly improve during session with practice and repetition of balance exercises; UE support needed for assist with balance recovery and for safety.  Cont with POC.    Personal Factors and Comorbidities Comorbidity 3+    Comorbidities CVA due to embolism R PCA; COVID -19, post-viral fatigue syndrome  PMH COVID 11/2020, arthritis, back pain, CAD, cirrhosis, diverticulitis/osis, GERD, HTN, IBS, osteopenia, PAF    Examination-Activity Limitations Locomotion Level;Transfers;Stand;Caring for Others;Dressing    Examination-Participation Restrictions Meal Prep;Cleaning;Community  Activity;Laundry    Stability/Clinical Decision Making Evolving/Moderate complexity    Rehab Potential Good    PT Frequency 2x / week    PT Duration 8 weeks   plus eval, so 9 wks totatl including eval week; will likely try for 1x/wk pool and 1x/wk in-clinic   PT Treatment/Interventions ADLs/Self Care Home Management;Aquatic Therapy;DME Instruction;Neuromuscular re-education;Balance training;Therapeutic exercise;Therapeutic activities;Functional mobility training;Gait training;Patient/family education;Manual techniques    PT Next Visit Plan Review HEP updates; Work on standing balance, gait activities.  OT eval (referral in chart);   I have told pt that I  will contact her regarding pool appt time next Mon., 05-15-21 [place])    Consulted and Agree with Plan of Care Patient             Patient will benefit from skilled therapeutic intervention in order to improve the following deficits and impairments:  Abnormal gait, Difficulty walking, Impaired flexibility, Decreased balance, Decreased mobility, Decreased strength, Postural dysfunction, Pain  Visit Diagnosis: Other abnormalities of gait and mobility  Unsteadiness on feet     Problem List Patient Active Problem List   Diagnosis Date Noted   COVID-19 02/14/2021   Postviral fatigue syndrome 02/14/2021   Difficulty with adaptive servo-ventilation (ASV) use 02/14/2021   Sepsis secondary to UTI ([place]) 09/08/2020   Elevated ALT measurement 09/08/2020   History of CVA with residual deficit 09/08/2020   Hyperbilirubinemia 09/08/2020   Fatigue associated with anemia 08/03/2020   Cerebrovascular accident (CVA) due to embolism of right posterior cerebral artery ([place]) 08/03/2020   OSA treated with BiPAP 08/03/2020   Complex sleep apnea syndrome 08/03/2020   Treatment-emergent central sleep apnea 08/03/2020   Chronic intermittent hypoxia with obstructive sleep apnea 04/21/2020   OSA (obstructive sleep apnea) 04/21/2020   History of cardioembolic stroke 29/92/4268   Gait disturbance, post-stroke 03/29/2020   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 ([place]) 03/29/2020   Anxiety    RLQ abdominal pain 10/22/2019   Paroxysmal atrial fibrillation ([place]) 05/22/2019   Iron deficiency anemia 05/07/2019   Atrial fibrillation with RVR ([place]) 10/21/2018   Cerebellar stroke, acute ([place]) 10/21/2018   Streptococcal endocarditis    Endocarditis of mitral valve 10/07/2018   Bacteremia due to Streptococcus Salivarius 10/07/2018   Sepsis ([place]) 34/19/6222   Acute metabolic encephalopathy 97/98/9211   Severe aortic stenosis 07/08/2018   S/P TAVR (transcatheter aortic valve replacement)  07/08/2018   Esophageal thickening    CAD in native artery 04/22/2018   Gastroesophageal reflux disease    Pulmonary hypertension ([place]) 02/21/2018   Essential hypertension 07/15/2017   History of colonic polyps 05/22/2017   Elevated liver function tests 12/05/2016   DOE (dyspnea on exertion) 07/19/2016   Thalassemia minor 05/29/2016   Left bundle branch block 12/06/2015   Upper airway cough syndrome 94/17/4081   Diastolic heart failure ([place]) 10/10/2015   Myalgia 02/17/2014   Carotid artery stenosis 06/05/2013   Eustachian tube dysfunction 05/07/2013   Neuropathy of leg 03/07/2012   Anemia, unspecified 01/31/2012   Hot flashes 08/24/2010   Diabetes mellitus due to underlying condition, uncontrolled 06/30/2010   Generalized abdominal pain 03/24/2010   Obesity (BMI 30.0-34.9) 12/21/2009   IBS (irritable bowel syndrome) 04/29/2009   Vitamin D deficiency 03/10/2009   Acute bronchitis 03/07/2009   Hypothyroidism 12/13/2008   Dyslipidemia 12/13/2008   Anxiety state 12/13/2008   Other specified disorders of bladder 12/13/2008    Alda Lea, PT 05/08/2021, 8:19 PM  [facility] [address]  [place], [place], [address] Phone: [phone]   Fax:  [phone]  Name: Breanna Webster MRN: [id number] Date of Birth: 04-Oct-1945

## 2021-05-08 NOTE — Telephone Encounter (Signed)
Letter mailed to the patient

## 2021-05-09 ENCOUNTER — Encounter: Payer: Self-pay | Admitting: Physical Therapy

## 2021-05-09 ENCOUNTER — Encounter: Payer: Self-pay | Admitting: Hematology

## 2021-05-09 ENCOUNTER — Other Ambulatory Visit: Payer: Self-pay

## 2021-05-09 ENCOUNTER — Ambulatory Visit: Payer: Medicare Other | Admitting: Physical Therapy

## 2021-05-09 ENCOUNTER — Other Ambulatory Visit (HOSPITAL_BASED_OUTPATIENT_CLINIC_OR_DEPARTMENT_OTHER): Payer: Self-pay

## 2021-05-09 DIAGNOSIS — R2681 Unsteadiness on feet: Secondary | ICD-10-CM | POA: Diagnosis not present

## 2021-05-09 DIAGNOSIS — M6281 Muscle weakness (generalized): Secondary | ICD-10-CM

## 2021-05-09 DIAGNOSIS — R29898 Other symptoms and signs involving the musculoskeletal system: Secondary | ICD-10-CM | POA: Diagnosis not present

## 2021-05-09 DIAGNOSIS — R2689 Other abnormalities of gait and mobility: Secondary | ICD-10-CM

## 2021-05-09 NOTE — Therapy (Signed)
[facility] [address], [place], [place], [address] Phone: [phone]   Fax:  [phone]  Physical Therapy Treatment  Patient Details  Name: Breanna Webster MRN: [id number] Date of Birth: 27-Oct-1945 Referring Provider (PT): Dohmeier, Asencion Partridge   Encounter Date: 05/09/2021   PT End of Session - 05/09/21 1137     Visit Number 4    Number of Visits 17    Date for PT Re-Evaluation 06/23/21    Authorization Type Medicare/Generic Commercial    Progress Note Due on Visit 10    PT Start Time 1106    PT Stop Time 1146   pt in bathroom   PT Time Calculation (min) 40 min    Activity Tolerance Patient tolerated treatment well    Behavior During Therapy [facility] for tasks assessed/performed             Past Medical History:  Diagnosis Date   Anxiety    Arthritis    "back" (04/22/2018)   Back pain    Blood transfusion without reported diagnosis    CAD (coronary artery disease)    a. 03/2018 s/p PCI/DES to the RCA (3.0x15 Onyx DES).   Carotid artery stenosis    Mild   Chest pain    Chronic lower back pain    Cirrhosis (HCC)    Colon polyps    Diverticulitis    Diverticulosis    Esophageal thickening    seen on pre TAVR CT scan, also questionable cirrhosis. MRI recommended. Will refer to GI   Fatty liver    GERD (gastroesophageal reflux disease)    Grave's disease    History of colonic polyps 05/22/2017   History of hiatal hernia    Hypertension    Hypothyroidism    IBS (irritable bowel syndrome)    Osteopenia    Pulmonary nodules    seen on pre TAVR CT. likley benign. no follow up recommended if pt low risk.   S/P TAVR (transcatheter aortic valve replacement)    Severe aortic stenosis    Shortness of breath on exertion    Stroke ([place])    Thalassemia minor    Thyroid disease    Type II diabetes mellitus ([place])     Past Surgical History:  Procedure Laterality Date   70 HOUR [place] STUDY N/A 03/03/2018   Procedure: 24  HOUR PH STUDY;  Surgeon: Mauri Pole, MD;  Location: [facility];  Service: Endoscopy;  Laterality: N/A;   AORTIC VALVE REPLACEMENT  06/2018   COLONOSCOPY     COLONOSCOPY W/ BIOPSIES AND POLYPECTOMY     CORONARY ANGIOGRAPHY Right 04/21/2018   Procedure: CORONARY ANGIOGRAPHY (CATH LAB);  Surgeon: [name], MD;  Location: [facility];  Service: Cardiovascular;  Laterality: Right;   CORONARY STENT INTERVENTION N/A 04/22/2018   Procedure: CORONARY STENT INTERVENTION;  Surgeon: [name], MD;  Location: [facility];  Service: Cardiovascular;  Laterality: N/A;   DILATION AND CURETTAGE OF UTERUS     ESOPHAGEAL MANOMETRY N/A 03/03/2018   Procedure: ESOPHAGEAL MANOMETRY (EM);  Surgeon: Mauri Pole, MD;  Location: [facility];  Service: Endoscopy;  Laterality: N/A;   GASTRIC FUNDOPLICATION     HERNIA REPAIR     HYSTEROSCOPY     fibroids   LAPAROSCOPIC CHOLECYSTECTOMY     LAPAROSCOPY     fibroids   NISSEN FUNDOPLICATION  9242A   POLYPECTOMY     RIGHT/LEFT HEART CATH AND CORONARY ANGIOGRAPHY  N/A 02/20/2018   Procedure: RIGHT/LEFT HEART CATH AND CORONARY ANGIOGRAPHY;  Surgeon: [name], MD;  Location: [facility];  Service: Cardiovascular;  Laterality: N/A;   TEE WITHOUT CARDIOVERSION N/A 07/08/2018   Procedure: TRANSESOPHAGEAL ECHOCARDIOGRAM (TEE);  Surgeon: Burnell Blanks, MD;  Location: [facility];  Service: Open Heart Surgery;  Laterality: N/A;   TEE WITHOUT CARDIOVERSION  10/07/2018   TEE WITHOUT CARDIOVERSION N/A 10/07/2018   Procedure: TRANSESOPHAGEAL ECHOCARDIOGRAM (TEE);  Surgeon: Jerline Pain, MD;  Location: [facility] ENDOSCOPY;  Service: Cardiovascular;  Laterality: N/A;   TONSILLECTOMY     TRANSCATHETER AORTIC VALVE REPLACEMENT, TRANSFEMORAL N/A 07/08/2018   Procedure: TRANSCATHETER AORTIC VALVE REPLACEMENT, TRANSFEMORAL;  Surgeon: Burnell Blanks, MD;  Location: [facility];  Service: Open Heart Surgery;  Laterality: N/A;     There were no vitals filed for this visit.   Subjective Assessment - 05/09/21 1108     Subjective Had her first aquatic therapy session. Has been doing her HEP all week.    Pertinent History significant arthritis in knees    Patient Stated Goals Pt's goals for therapy are to get better with walking and with arthritis.    Currently in Pain? No/denies                               [facility] Adult PT Treatment/Exercise - 05/09/21 0001       Neuro Re-ed    Neuro Re-ed Details  R/L wt shift with PT verbal and manual cues to allow full shift 20x, 3x10 w shift + march with PT assisting to encourage full wt shift and decrease speed   no UE support     Knee/Hip Exercises: Stretches   Press photographer Right;Left;30 seconds;1 rep    Press photographer Limitations runner's stretch      Knee/Hip Exercises: Aerobic   Nustep L4 x 6 min (UEs/LEs)      Knee/Hip Exercises: Seated   Long Arc Quad Strengthening;Right;Left;1 set;15 reps;Limitations    Long Arc Quad Limitations 10x with yellow TB, 5x without resistance    Other Seated Knee/Hip Exercises sitting B heel/toe raise 10x    Sit to Sand 1 set;10 reps;without UE support   cues to avoid "plop"                Balance Exercises - 05/09/21 1135       Balance Exercises: Standing   Standing Eyes Opened Narrow base of support (BOS);Solid surface;30 secs;Limitations;Foam/compliant surface;2 reps   good stability   Standing Eyes Closed Narrow base of support (BOS);Solid surface;1 rep;30 secs;Limitations    Standing Eyes Closed Limitations good stability                PT Education - 05/09/21 1238     Education Details update to HEP-Access Code: [email or web address]    Person(s) Educated Patient    Methods Explanation;Demonstration;Tactile cues;Verbal cues;Handout    Comprehension Verbalized understanding;Returned demonstration              PT Short Term Goals - 05/08/21 2016       PT SHORT TERM GOAL #1   Title  Pt will be independent with HEP for improved strength and flexibility, balance, transfers, and gait.   TARGET 05/26/2021    Time 4    Period Weeks    Status On-going      PT SHORT TERM GOAL #2   Title Pt will improve Berg score to  at least 41/56 to decrease fall risk.    Baseline 36/56    Time 4    Period Weeks    Status On-going      PT SHORT TERM GOAL #3   Title Pt will improve TUG score to less than or equal to 21 sec for decreased fall risk.    Baseline 26.88 sec    Time 4    Period Weeks    Status On-going      PT SHORT TERM GOAL #4   Title Pt will verbalize understanding of fall prevention in home environment.    Time 4    Period Weeks    Status On-going               PT Long Term Goals - 05/08/21 2016       PT LONG TERM GOAL #1   Title Pt will be independent with HEP for improved flexibility and strength, balance, transfers, and gait.  TARGET 06/23/2021    Time 9    Period Weeks    Status On-going      PT LONG TERM GOAL #2   Title Pt will improve Berg score to at least 46/56 to decrease fall risk.    Time 9    Period Weeks    Status On-going      PT LONG TERM GOAL #3   Title Pt will improve TUG score to less than or equal to 16 sec for decreased fall risk.    Time 9    Period Weeks    Status On-going      PT LONG TERM GOAL #4   Title Pt will improve gait velocity to at least 2 ft/sec for improved gait efficiency and safety.    Baseline 1.73 ft/sec    Time 9    Period Weeks    Status On-going      PT LONG TERM GOAL #5   Title Pt will verbalize plans for continued community fitness upon d/c to maximize gains made in PT.    Time 9    Period Weeks    Status On-going                   Plan - 05/09/21 1239     Clinical Impression Statement Patient arrived to session without new issues. Patient remarking on the arthritis in her knees and asking why she should do exercises- requiring continued education on benefits of movement for arthritic  pain. Reviewed HEP and provided banded resistance to progress LAQ. Patient demonstrated limited TKE but tolerated it well. Had good carryover with other activities and with improved gastroc flexibility evident. Patient still apprehensive to perform standing exercises without UE support. Improved control and stability evident after verbal and manual cues to full weight shift to standing LE. Initiated Romberg stance with patient tolerating removal of vision/somatosensory input. Updated HEP with cues provided today. Patient reported understanding and without complaints at end of session.    Personal Factors and Comorbidities Comorbidity 3+    Comorbidities CVA due to embolism R PCA; COVID -19, post-viral fatigue syndrome  PMH COVID 11/2020, arthritis, back pain, CAD, cirrhosis, diverticulitis/osis, GERD, HTN, IBS, osteopenia, PAF    Examination-Activity Limitations Locomotion Level;Transfers;Stand;Caring for Others;Dressing    Examination-Participation Restrictions Meal Prep;Cleaning;Community Activity;Laundry    Stability/Clinical Decision Making Evolving/Moderate complexity    Rehab Potential Good    PT Frequency 2x / week    PT Duration 8 weeks   plus eval, so 9 wks  totatl including eval week; will likely try for 1x/wk pool and 1x/wk in-clinic   PT Treatment/Interventions ADLs/Self Care Home Management;Aquatic Therapy;DME Instruction;Neuromuscular re-education;Balance training;Therapeutic exercise;Therapeutic activities;Functional mobility training;Gait training;Patient/family education;Manual techniques    PT Next Visit Plan Work on standing balance, gait activities.  OT eval (referral in chart);   I have told pt that I will contact her regarding pool appt time next Mon., 05-15-21 [place])    Consulted and Agree with Plan of Care Patient             Patient will benefit from skilled therapeutic intervention in order to improve the following deficits and impairments:  Abnormal gait, Difficulty  walking, Impaired flexibility, Decreased balance, Decreased mobility, Decreased strength, Postural dysfunction, Pain  Visit Diagnosis: Other abnormalities of gait and mobility  Unsteadiness on feet  Muscle weakness (generalized)  Other symptoms and signs involving the musculoskeletal system     Problem List Patient Active Problem List   Diagnosis Date Noted   COVID-19 02/14/2021   Postviral fatigue syndrome 02/14/2021   Difficulty with adaptive servo-ventilation (ASV) use 02/14/2021   Sepsis secondary to UTI ([place]) 09/08/2020   Elevated ALT measurement 09/08/2020   History of CVA with residual deficit 09/08/2020   Hyperbilirubinemia 09/08/2020   Fatigue associated with anemia 08/03/2020   Cerebrovascular accident (CVA) due to embolism of right posterior cerebral artery ([place]) 08/03/2020   OSA treated with BiPAP 08/03/2020   Complex sleep apnea syndrome 08/03/2020   Treatment-emergent central sleep apnea 08/03/2020   Chronic intermittent hypoxia with obstructive sleep apnea 04/21/2020   OSA (obstructive sleep apnea) 04/21/2020   History of cardioembolic stroke 75/17/0017   Gait disturbance, post-stroke 03/29/2020   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 ([place]) 03/29/2020   Anxiety    RLQ abdominal pain 10/22/2019   Paroxysmal atrial fibrillation ([place]) 05/22/2019   Iron deficiency anemia 05/07/2019   Atrial fibrillation with RVR ([place]) 10/21/2018   Cerebellar stroke, acute ([place]) 10/21/2018   Streptococcal endocarditis    Endocarditis of mitral valve 10/07/2018   Bacteremia due to Streptococcus Salivarius 10/07/2018   Sepsis ([place]) 49/44/9675   Acute metabolic encephalopathy 91/63/8466   Severe aortic stenosis 07/08/2018   S/P TAVR (transcatheter aortic valve replacement) 07/08/2018   Esophageal thickening    CAD in native artery 04/22/2018   Gastroesophageal reflux disease    Pulmonary hypertension ([place]) 02/21/2018   Essential hypertension 07/15/2017    History of colonic polyps 05/22/2017   Elevated liver function tests 12/05/2016   DOE (dyspnea on exertion) 07/19/2016   Thalassemia minor 05/29/2016   Left bundle branch block 12/06/2015   Upper airway cough syndrome 59/93/5701   Diastolic heart failure ([place]) 10/10/2015   Myalgia 02/17/2014   Carotid artery stenosis 06/05/2013   Eustachian tube dysfunction 05/07/2013   Neuropathy of leg 03/07/2012   Anemia, unspecified 01/31/2012   Hot flashes 08/24/2010   Diabetes mellitus due to underlying condition, uncontrolled 06/30/2010   Generalized abdominal pain 03/24/2010   Obesity (BMI 30.0-34.9) 12/21/2009   IBS (irritable bowel syndrome) 04/29/2009   Vitamin D deficiency 03/10/2009   Acute bronchitis 03/07/2009   Hypothyroidism 12/13/2008   Dyslipidemia 12/13/2008   Anxiety state 12/13/2008   Other specified disorders of bladder 12/13/2008    Janene Harvey, PT, DPT 05/09/21 12:42 PM   [facility] [address], [place], [place], [address] Phone: [phone]   Fax:  [phone]  Name: Breanna Webster MRN: [id number] Date of Birth: 02/16/1946

## 2021-05-10 ENCOUNTER — Other Ambulatory Visit (HOSPITAL_BASED_OUTPATIENT_CLINIC_OR_DEPARTMENT_OTHER): Payer: Self-pay

## 2021-05-15 ENCOUNTER — Ambulatory Visit: Payer: BLUE CROSS/BLUE SHIELD | Admitting: Physical Therapy

## 2021-05-16 ENCOUNTER — Ambulatory Visit: Payer: Medicare Other | Admitting: Physical Therapy

## 2021-05-16 ENCOUNTER — Other Ambulatory Visit: Payer: Medicare Other

## 2021-05-16 ENCOUNTER — Other Ambulatory Visit: Payer: Self-pay

## 2021-05-16 DIAGNOSIS — R2689 Other abnormalities of gait and mobility: Secondary | ICD-10-CM

## 2021-05-16 DIAGNOSIS — R2681 Unsteadiness on feet: Secondary | ICD-10-CM

## 2021-05-16 DIAGNOSIS — R29898 Other symptoms and signs involving the musculoskeletal system: Secondary | ICD-10-CM | POA: Diagnosis not present

## 2021-05-16 DIAGNOSIS — D508 Other iron deficiency anemias: Secondary | ICD-10-CM | POA: Diagnosis not present

## 2021-05-16 DIAGNOSIS — M6281 Muscle weakness (generalized): Secondary | ICD-10-CM | POA: Diagnosis not present

## 2021-05-16 LAB — IRON,TIBC AND FERRITIN PANEL
%SAT: 36 % (calc) (ref 16–45)
Ferritin: 97 ng/mL (ref 16–288)
Iron: 106 ug/dL (ref 45–160)
TIBC: 294 mcg/dL (calc) (ref 250–450)

## 2021-05-16 NOTE — Therapy (Signed)
[facility] [address], [place], [place], [address] Phone: [phone]   Fax:  [phone]  Physical Therapy Treatment  Patient Details  Name: Breanna Webster MRN: [id number] Date of Birth: [birth year] Referring Provider (PT): Dohmeier, Asencion Partridge   Encounter Date: 05/16/2021   PT End of Session - 05/16/21 1202     Visit Number 5    Number of Visits 17    Date for PT Re-Evaluation 06/23/21    Authorization Type Medicare/Generic Commercial    Progress Note Due on Visit 10    PT Start Time 1104    PT Stop Time 1146    PT Time Calculation (min) 42 min    Activity Tolerance Patient tolerated treatment well    Behavior During Therapy [facility] for tasks assessed/performed             Past Medical History:  Diagnosis Date   Anxiety    Arthritis    "back" (04/22/2018)   Back pain    Blood transfusion without reported diagnosis    CAD (coronary artery disease)    a. 03/2018 s/p PCI/DES to the RCA (3.0x15 Onyx DES).   Carotid artery stenosis    Mild   Chest pain    Chronic lower back pain    Cirrhosis (HCC)    Colon polyps    Diverticulitis    Diverticulosis    Esophageal thickening    seen on pre TAVR CT scan, also questionable cirrhosis. MRI recommended. Will refer to GI   Fatty liver    GERD (gastroesophageal reflux disease)    Grave's disease    History of colonic polyps 05/22/2017   History of hiatal hernia    Hypertension    Hypothyroidism    IBS (irritable bowel syndrome)    Osteopenia    Pulmonary nodules    seen on pre TAVR CT. likley benign. no follow up recommended if pt low risk.   S/P TAVR (transcatheter aortic valve replacement)    Severe aortic stenosis    Shortness of breath on exertion    Stroke ([place])    Thalassemia minor    Thyroid disease    Type II diabetes mellitus ([place])     Past Surgical History:  Procedure Laterality Date   53 HOUR [place] STUDY N/A 03/03/2018   Procedure: 24 HOUR [place] STUDY;   Surgeon: Mauri Pole, MD;  Location: [facility];  Service: Endoscopy;  Laterality: N/A;   AORTIC VALVE REPLACEMENT  06/2018   COLONOSCOPY     COLONOSCOPY W/ BIOPSIES AND POLYPECTOMY     CORONARY ANGIOGRAPHY Right 04/21/2018   Procedure: CORONARY ANGIOGRAPHY (CATH LAB);  Surgeon: [name], MD;  Location: [place] CV LAB;  Service: Cardiovascular;  Laterality: Right;   CORONARY STENT INTERVENTION N/A 04/22/2018   Procedure: CORONARY STENT INTERVENTION;  Surgeon: [name], MD;  Location: [place] CV LAB;  Service: Cardiovascular;  Laterality: N/A;   DILATION AND CURETTAGE OF UTERUS     ESOPHAGEAL MANOMETRY N/A 03/03/2018   Procedure: ESOPHAGEAL MANOMETRY (EM);  Surgeon: Mauri Pole, MD;  Location: [facility];  Service: Endoscopy;  Laterality: N/A;   GASTRIC FUNDOPLICATION     HERNIA REPAIR     HYSTEROSCOPY     fibroids   LAPAROSCOPIC CHOLECYSTECTOMY     LAPAROSCOPY     fibroids   NISSEN FUNDOPLICATION  7591M   POLYPECTOMY     RIGHT/LEFT HEART CATH AND CORONARY ANGIOGRAPHY N/A 02/20/2018  Procedure: RIGHT/LEFT HEART CATH AND CORONARY ANGIOGRAPHY;  Surgeon: [name], MD;  Location: [place] CV LAB;  Service: Cardiovascular;  Laterality: N/A;   TEE WITHOUT CARDIOVERSION N/A 07/08/2018   Procedure: TRANSESOPHAGEAL ECHOCARDIOGRAM (TEE);  Surgeon: Burnell Blanks, MD;  Location: [place] ;  Service: Open Heart Surgery;  Laterality: N/A;   TEE WITHOUT CARDIOVERSION  10/07/2018   TEE WITHOUT CARDIOVERSION N/A 10/07/2018   Procedure: TRANSESOPHAGEAL ECHOCARDIOGRAM (TEE);  Surgeon: Jerline Pain, MD;  [facility] ENDOSCOPY;  Service: Cardiovascular;  Laterality: N/A;   TONSILLECTOMY     TRANSCATHETER AORTIC VALVE REPLACEMENT, TRANSFEMORAL N/A 07/08/2018   Procedure: TRANSCATHETER AORTIC VALVE REPLACEMENT, TRANSFEMORAL;  Surgeon: Burnell Blanks, MD;  Location: [place];  Service: Open Heart Surgery;  Laterality: N/A;    There were no  vitals filed for this visit.                      [place] Adult PT Treatment/Exercise - 05/16/21 0001       Ambulation/Gait   Ambulation/Gait Yes    Ambulation/Gait Assistance 4: Min guard;4: Min assist    Ambulation/Gait Assistance Details Pt requests to trial cane today.  Initial min assist for gait sequence.    Ambulation Distance (Feet) 30 Feet   x 4, then 100 ft x 2   Assistive device None;Straight cane   also small rubber tip quad cane   Gait Pattern Step-through pattern;Decreased step length - left;Decreased stance time - right;Decreased dorsiflexion - left;Decreased hip/knee flexion - right;Decreased hip/knee flexion - left;Poor foot clearance - left;Narrow base of support    Ambulation Surface Level;Indoor    Gait Comments Cues for gait sequence, using cane in RUE sequenced with LLE step; she gets the sequence well.  Cues provided for increased LLE foot clearance/heelstrike (as foot slide is audible on LLE) and for wider BOS.  Pt feels she doesn't need cane indoors, but PT discusses and pt agrees that it may be helpful for outdoors.      Knee/Hip Exercises: Aerobic   Nustep L4 x 7 min , 4 extremities, keeping pace >60 steps/min            Reviewed HEP exercises below from HEP:  Standing March with Counter Support - 1 x daily - 5 x weekly - 2 sets - 10 reps; performed 2nd set with cues for widened BOS in midline Side to Side Weight Shift with Counter Support - 1 x daily - 5 x weekly - 2 sets - 10 reps, 2nd set performed without UE support, cues to use this exercise after she's been sitting a while or upon first standing. Sit to Stand with Arms Crossed - 1 x daily - 5 x weekly - 2 sets - 10 reps-cues for slowed descent     Balance Exercises - 05/16/21 0001       Balance Exercises: Standing   Standing Eyes Opened Narrow base of support (BOS);Solid surface;Limitations;Foam/compliant surface;Wide (BOA)    Standing Eyes Opened Limitations Head turns/head nods x  5 reps    Standing Eyes Closed Narrow base of support (BOS);Solid surface;1 rep;30 secs;Limitations;Wide (BOA);Foam/compliant surface    Standing Eyes Closed Limitations intermittent UE support    Heel Raises Both;10 reps    Toe Raise 10 reps;Both    Other Standing Exercises Stagger stance rocking forward/back with UE support, 10 reps each foot position.                PT Education - 05/16/21  1201     Education Details For marching in HEP-have wider BOS    Person(s) Educated Patient    Methods Explanation;Demonstration;Verbal cues    Comprehension Verbalized understanding;Returned demonstration              PT Short Term Goals - 05/08/21 2016       PT SHORT TERM GOAL #1   Title Pt will be independent with HEP for improved strength and flexibility, balance, transfers, and gait.   TARGET 05/26/2021    Time 4    Period Weeks    Status On-going      PT SHORT TERM GOAL #2   Title Pt will improve Berg score to at least 41/56 to decrease fall risk.    Baseline 36/56    Time 4    Period Weeks    Status On-going      PT SHORT TERM GOAL #3   Title Pt will improve TUG score to less than or equal to 21 sec for decreased fall risk.    Baseline 26.88 sec    Time 4    Period Weeks    Status On-going      PT SHORT TERM GOAL #4   Title Pt will verbalize understanding of fall prevention in home environment.    Time 4    Period Weeks    Status On-going               PT Long Term Goals - 05/08/21 2016       PT LONG TERM GOAL #1   Title Pt will be independent with HEP for improved flexibility and strength, balance, transfers, and gait.  TARGET 06/23/2021    Time 9    Period Weeks    Status On-going      PT LONG TERM GOAL #2   Title Pt will improve Berg score to at least 46/56 to decrease fall risk.    Time 9    Period Weeks    Status On-going      PT LONG TERM GOAL #3   Title Pt will improve TUG score to less than or equal to 16 sec for decreased fall risk.     Time 9    Period Weeks    Status On-going      PT LONG TERM GOAL #4   Title Pt will improve gait velocity to at least 2 ft/sec for improved gait efficiency and safety.    Baseline 1.73 ft/sec    Time 9    Period Weeks    Status On-going      PT LONG TERM GOAL #5   Title Pt will verbalize plans for continued community fitness upon d/c to maximize gains made in PT.    Time 9    Period Weeks    Status On-going                   Plan - 05/16/21 1202     Clinical Impression Statement Skilled PT session today focused on lower extremity strengthening, balance, and gait training.  With review of sit<>Stand, pt requires cues for scooting to edge of chair and continued cues for slowed descent for improved muscle strength and control.  With standing balance on solid/compliant surfaces and varied foot positions, she continues to prefer intermittent UE support.  Pt requests gait training today with cane (used straight cane and SPC with small rubber quad base), with pt having good sequencing and cane placement after initial instruction, but  she continues to have decreased L foot clearance and decreased L heelstrike (with audible L foot scuffing) as well as narrowed BOS.  She will benefit from continued skilled PT towards goals for imrpoved functional mobility and decreased fall risk.    Personal Factors and Comorbidities Comorbidity 3+    Comorbidities CVA due to embolism R PCA; COVID -19, post-viral fatigue syndrome  PMH COVID 11/2020, arthritis, back pain, CAD, cirrhosis, diverticulitis/osis, GERD, HTN, IBS, osteopenia, PAF    Examination-Activity Limitations Locomotion Level;Transfers;Stand;Caring for Others;Dressing    Examination-Participation Restrictions Meal Prep;Cleaning;Community Activity;Laundry    Stability/Clinical Decision Making Evolving/Moderate complexity    Rehab Potential Good    PT Frequency 2x / week    PT Duration 8 weeks   plus eval, so 9 wks totatl including eval week;  will likely try for 1x/wk pool and 1x/wk in-clinic   PT Treatment/Interventions ADLs/Self Care Home Management;Aquatic Therapy;DME Instruction;Neuromuscular re-education;Balance training;Therapeutic exercise;Therapeutic activities;Functional mobility training;Gait training;Patient/family education;Manual techniques    PT Next Visit Plan Check STGs.  Work on standing balance, gait activities.  Gait training with cane outdoor surfaces; work on timing/coordination with LLE hip/knee flexion with gait.  I have told pt that I will contact her regarding pool appt time next Mon., 05-15-21 [place])    Consulted and Agree with Plan of Care Patient             Patient will benefit from skilled therapeutic intervention in order to improve the following deficits and impairments:  Abnormal gait, Difficulty walking, Impaired flexibility, Decreased balance, Decreased mobility, Decreased strength, Postural dysfunction, Pain  Visit Diagnosis: Unsteadiness on feet  Other abnormalities of gait and mobility  Muscle weakness (generalized)     Problem List Patient Active Problem List   Diagnosis Date Noted   COVID-19 02/14/2021   Postviral fatigue syndrome 02/14/2021   Difficulty with adaptive servo-ventilation (ASV) use 02/14/2021   Sepsis secondary to UTI ([place]) 09/08/2020   Elevated ALT measurement 09/08/2020   History of CVA with residual deficit 09/08/2020   Hyperbilirubinemia 09/08/2020   Fatigue associated with anemia 08/03/2020   Cerebrovascular accident (CVA) due to embolism of right posterior cerebral artery ([place]) 08/03/2020   OSA treated with BiPAP 08/03/2020   Complex sleep apnea syndrome 08/03/2020   Treatment-emergent central sleep apnea 08/03/2020   Chronic intermittent hypoxia with obstructive sleep apnea 04/21/2020   OSA (obstructive sleep apnea) 04/21/2020   History of cardioembolic stroke 38/75/6433   Gait disturbance, post-stroke 03/29/2020   Peripheral neuropathy due to  disorder of metabolism ([place]) 03/29/2020   Anxiety    RLQ abdominal pain 10/22/2019   Paroxysmal atrial fibrillation ([place]) 05/22/2019   Iron deficiency anemia 05/07/2019   Atrial fibrillation with RVR ([place]) 10/21/2018   Cerebellar stroke, acute ([place]) 10/21/2018   Streptococcal endocarditis    Endocarditis of mitral valve 10/07/2018   Bacteremia due to Streptococcus Salivarius 10/07/2018   Sepsis ([place]) 29/51/8841   Acute metabolic encephalopathy 66/09/3014   Severe aortic stenosis 07/08/2018   S/P TAVR (transcatheter aortic valve replacement) 07/08/2018   Esophageal thickening    CAD in native artery 04/22/2018   Gastroesophageal reflux disease    Pulmonary hypertension ([place]) 02/21/2018   Essential hypertension 07/15/2017   History of colonic polyps 05/22/2017   Elevated liver function tests 12/05/2016   DOE (dyspnea on exertion) 07/19/2016   Thalassemia minor 05/29/2016   Left bundle branch block 12/06/2015   Upper airway cough syndrome 05/01/3233   Diastolic heart failure ([place]) 10/10/2015   Myalgia 02/17/2014   Carotid  artery stenosis 06/05/2013   Eustachian tube dysfunction 05/07/2013   Neuropathy of leg 03/07/2012   Anemia, unspecified 01/31/2012   Hot flashes 08/24/2010   Diabetes mellitus due to underlying condition, uncontrolled 06/30/2010   Generalized abdominal pain 03/24/2010   Obesity (BMI 30.0-34.9) 12/21/2009   IBS (irritable bowel syndrome) 04/29/2009   Vitamin D deficiency 03/10/2009   Acute bronchitis 03/07/2009   Hypothyroidism 12/13/2008   Dyslipidemia 12/13/2008   Anxiety state 12/13/2008   Other specified disorders of bladder 12/13/2008    Maize Brittingham W., PT 05/16/2021, 12:10 PM  [facility] [address], [place], [place], [address] Phone: [phone]   Fax:  [phone]  Name: Breanna Webster MRN: [id number] Date of Birth: [birth year]

## 2021-05-17 ENCOUNTER — Telehealth: Payer: Self-pay | Admitting: Pharmacist

## 2021-05-17 NOTE — Chronic Care Management (AMB) (Signed)
Chronic Care Management Pharmacy Assistant   Name: Breanna Webster  MRN: [id number] DOB: 1945/05/25  Reason for Encounter: Disease State / Hypertension  Assessment Call   Conditions to be addressed/monitored: HTN   Recent office visits:  None  Recent consult visits:  04/27/2021 Christinia Gully MD (pulmonary) - Patient was seen for upper airway cough syndrome and an additional issue. Started on Augmentin 875/125 mg twice daily. Follow up in 4 weeks.   04/26/2021 Carl Best NP (GI) - Patient was seen for Barrett's esophagus without dysplasia and additional issue. Started Omeprazole 20 mg twice daily. Follow up in 3 months.  Hospital visits:  None  Medications: Outpatient Encounter Medications as of 05/17/2021  Medication Sig   amitriptyline (ELAVIL) 25 MG tablet Take 1 tablet (25 mg total) by mouth at bedtime.   amoxicillin-clavulanate (AUGMENTIN) 875-125 MG tablet Take 1 tablet by mouth twice daily for 10 days   apixaban (ELIQUIS) 5 MG TABS tablet TAKE ONE TABLET BY MOUTH TWICE A DAY   b complex vitamins capsule Take 1 capsule by mouth daily.   BD PEN NEEDLE NANO 2ND GEN 32G X 4 MM MISC    cholecalciferol (VITAMIN D) 25 MCG (1000 UNIT) tablet Take 1,000 Units by mouth daily.   diltiazem (CARDIZEM CD) 120 MG 24 hr capsule Take 1 capsule (120 mg total) by mouth daily.   escitalopram (LEXAPRO) 20 MG tablet TAKE ONE TABLET BY MOUTH DAILY   estradiol (ESTRACE) 0.1 MG/GM vaginal cream Place vaginally.   estradiol (ESTRACE) 0.1 MG/GM vaginal cream Place 1 applicatorful into the vagina 2 nights per week   Evolocumab (REPATHA SURECLICK) 379 MG/ML SOAJ Inject 1 dose into the skin every 14 (fourteen) days.   famotidine (PEPCID) 20 MG tablet Take 1 tablet by mouth every night at bedtime   glucose blood test strip Use to check blood sugar 3 times daily   Insulin Pen Needle 32G X 4 MM MISC Use to inject 4 times daily   Insulin Syringe-Needle U-100 (ULTICARE INSULIN SYRINGE) 31G X  5/16" 1 ML MISC FOUR TIMES A DAY   Insulin Syringe-Needle U-100 31G X 5/16" 1 ML MISC Use for injections 4 times daily   Lancets (ONETOUCH DELICA PLUS [id number]) MISC USE TO CHECK BLOOD SUGAR TWICE DAILY.Marland Kitchen   Lancets (ONETOUCH DELICA PLUS [id number]) MISC Use 1 lancet 2 times daily   Lancets (ONETOUCH DELICA PLUS [id number]) MISC Use 1 lancet 2 times daily   levothyroxine (SYNTHROID) 125 MCG tablet Take 1 tablet by mouth every morning on an empty stomach   levothyroxine (SYNTHROID) 137 MCG tablet Take 137 mcg by mouth once.   levothyroxine (SYNTHROID) 137 MCG tablet Take 1 tablet by mouth every morning on an empty stomach   losartan (COZAAR) 50 MG tablet TAKE ONE TABLET BY MOUTH DAILY   metFORMIN (GLUCOPHAGE-XR) 500 MG 24 hr tablet Take 500 mg by mouth 2 (two) times daily.   metFORMIN (GLUCOPHAGE-XR) 500 MG 24 hr tablet Take 1 tablet by mouth 2 times daily   mirabegron ER (MYRBETRIQ) 50 MG TB24 tablet Take 1 tablet by mouth once daily.   mirabegron ER (MYRBETRIQ) 50 MG TB24 tablet Take 50 mg by mouth daily.   Multiple Vitamin (MULITIVITAMIN WITH MINERALS) TABS Take 1 tablet by mouth daily.   nitroGLYCERIN (NITROSTAT) 0.4 MG SL tablet Place 1 tablet (0.4 mg total) under the tongue every 5 (five) minutes as needed for chest pain.   NOVOLIN N 100 UNIT/ML injection Inject 28 Units into the skin  daily before breakfast.   NOVOLIN R 100 UNIT/ML injection 8 Units 2 (two) times daily before a meal. 10 UNITS THIRTY MINUTES BEFORE MEALS.  5 ADDITIONAL UNITS WITH CARBS OR SNACKS. (04/26/2021:  Pt reports she only takes the 8 units 2x/daily before meals, not the 10 units)   omeprazole (PRILOSEC) 20 MG capsule Take 1 capsule (20 mg total) by mouth 2 (two) times daily before a meal.   omeprazole (PRILOSEC) 20 MG capsule Take 1 capsule by mouth twice daily before meals   OneTouch Delica Lancets 40J MISC Tests BS 2 times   ONETOUCH VERIO test strip USE TO MONITOR GLUCOSE LEVELS TWICE DAILY   No facility-administered  encounter medications on file as of 05/17/2021.  Fill History: AMITRIPTYLINE HCL 25 MG TAB 04/06/2021 90   ELIQUIS 5 MG TABLET 04/18/2021 30   ESCITALOPRAM 20 MG TABLET 03/24/2021 90   ESTRADIOL 0.01% CREAM 01/23/2021 147   Evolocumab (REPATHA SURECLICK) 811 MG/ML SOAJ 05/10/2021 84   FAMOTIDINE 20 MG TABLET 01/23/2021 30   SYNTHROID 137 MCG TABLET 04/18/2021 90   LOSARTAN POTASSIUM 50 MG TAB 04/18/2021 90   MYRBETRIQ ER 50 MG TABLET 04/04/2021 30   NITROGLYCERIN 0.4 MG TABLET SL 08/30/2020 25   OMEPRAZOLE DR 20 MG CAPSULE 02/22/2021 30   DILTIAZEM 24H ER(CD) 120 MG CP 03/08/2021 90   METFORMIN HCL ER 500 MG TABLET 02/17/2021 90   Reviewed chart prior to disease state call. Spoke with patient regarding BP  Recent Office Vitals: BP Readings from Last 3 Encounters:  04/27/21 118/60  04/26/21 (!) 116/58  04/14/21 116/70   Pulse Readings from Last 3 Encounters:  04/27/21 90  04/26/21 80  04/14/21 73    Wt Readings from Last 3 Encounters:  04/27/21 176 lb (79.8 kg)  04/26/21 176 lb (79.8 kg)  04/14/21 177 lb 12.8 oz (80.6 kg)     Kidney Function Lab Results  Component Value Date/Time   CREATININE 0.59 04/05/2021 03:55 PM   CREATININE 0.76 03/13/2021 11:54 AM   CREATININE 0.83 09/07/2020 09:20 AM   CREATININE 0.77 02/12/2020 11:22 AM   GFR 75.63 03/13/2021 11:54 AM   GFRNONAA >60 04/05/2021 03:55 PM   GFRNONAA >60 09/07/2020 09:20 AM   GFRAA 99 12/05/2020 12:00 AM   GFRAA >60 08/14/2019 10:25 AM    BMP Latest Ref Rng & Units 04/05/2021 03/13/2021 12/05/2020  Glucose 70 - 99 mg/dL 87 163(H) -  BUN 8 - 23 mg/dL 15 19 13   Creatinine 0.44 - 1.00 mg/dL 0.59 0.76 0.7  BUN/Creat Ratio 12 - 28 - - -  Sodium 135 - 145 mmol/L 138 138 140  Potassium 3.5 - 5.1 mmol/L 3.7 4.0 4.1  Chloride 98 - 111 mmol/L 105 103 107  CO2 22 - 32 mmol/L 27 26 28(A)  Calcium 8.9 - 10.3 mg/dL 10.3 10.7(H) 10.4   Current antihypertensive regimen:  Diltiazem 120 mg daily Losartan 50  mg daily  How often are you checking your Blood Pressure?  Patient is not checking blood pressures, she states she goes to various doctors on a regular basis and her blood pressure readings are always good at her visits.   What recent interventions/DTPs have been made by any provider to improve Blood Pressure control since last CPP Visit: No recent interventions have been made.  Any recent hospitalizations or ED visits since last visit with CPP? No recent hospital visits.   What diet changes have been made to improve Blood Pressure Control?  Patient follows a low  sodium diet Breakfast - Patient will have oatmeal or eggs and toast or a bagel with cream cheese.  Lunch - Patient doesn't always eat lunch, when she eat she will graze lightly or eat out with friends. Dinner - Patient doesn't always eat dinner, when she does it is a dish that contains vegetables and most of the time fish or chicken.   What exercise is being done to improve your Blood Pressure Control?  Patient is taking aqua therapy and neuro therapy and is doing the exercises regularly.  Adherence Review: Is the patient currently on ACE/ARB medication? Yes Does the patient have >5 day gap between last estimated fill dates? No   Care Gaps: AWV - scheduled 04/05/2022 Last BP - 118/60 on 04/27/2021 Last A1C - 7.4 on 09/08/2020 Foot exam - overdue  Star Rating Drugs: Losartan 50 mg  - last filled 04/18/2021 90 DS at [facility] Metformin 500 mg - last filled 02/17/2021 90 DS at [facility] Pharmacist Assistant [phone]

## 2021-05-18 ENCOUNTER — Ambulatory Visit: Payer: Medicare Other | Admitting: Adult Health

## 2021-05-22 ENCOUNTER — Other Ambulatory Visit: Payer: Self-pay

## 2021-05-22 ENCOUNTER — Ambulatory Visit: Payer: Medicare Other | Admitting: Physical Therapy

## 2021-05-22 DIAGNOSIS — M6281 Muscle weakness (generalized): Secondary | ICD-10-CM | POA: Diagnosis not present

## 2021-05-22 DIAGNOSIS — R2689 Other abnormalities of gait and mobility: Secondary | ICD-10-CM | POA: Diagnosis not present

## 2021-05-22 DIAGNOSIS — R2681 Unsteadiness on feet: Secondary | ICD-10-CM | POA: Diagnosis not present

## 2021-05-22 DIAGNOSIS — R29898 Other symptoms and signs involving the musculoskeletal system: Secondary | ICD-10-CM | POA: Diagnosis not present

## 2021-05-23 ENCOUNTER — Ambulatory Visit: Payer: Medicare Other

## 2021-05-23 ENCOUNTER — Encounter: Payer: Self-pay | Admitting: Physical Therapy

## 2021-05-23 DIAGNOSIS — E785 Hyperlipidemia, unspecified: Secondary | ICD-10-CM

## 2021-05-23 DIAGNOSIS — I2510 Atherosclerotic heart disease of native coronary artery without angina pectoris: Secondary | ICD-10-CM

## 2021-05-23 DIAGNOSIS — I480 Paroxysmal atrial fibrillation: Secondary | ICD-10-CM

## 2021-05-23 DIAGNOSIS — E1159 Type 2 diabetes mellitus with other circulatory complications: Secondary | ICD-10-CM

## 2021-05-23 DIAGNOSIS — Z8673 Personal history of transient ischemic attack (TIA), and cerebral infarction without residual deficits: Secondary | ICD-10-CM

## 2021-05-23 DIAGNOSIS — R350 Frequency of micturition: Secondary | ICD-10-CM

## 2021-05-23 DIAGNOSIS — I4891 Unspecified atrial fibrillation: Secondary | ICD-10-CM

## 2021-05-23 DIAGNOSIS — Z794 Long term (current) use of insulin: Secondary | ICD-10-CM | POA: Diagnosis not present

## 2021-05-23 DIAGNOSIS — I10 Essential (primary) hypertension: Secondary | ICD-10-CM

## 2021-05-23 DIAGNOSIS — Z7984 Long term (current) use of oral hypoglycemic drugs: Secondary | ICD-10-CM | POA: Diagnosis not present

## 2021-05-23 DIAGNOSIS — E1169 Type 2 diabetes mellitus with other specified complication: Secondary | ICD-10-CM

## 2021-05-23 NOTE — Chronic Care Management (AMB) (Signed)
Chronic Care Management   CCM RN Visit Note  05/23/2021 Name: Breanna Webster MRN: [id number] DOB: Feb 08, 1946  Subjective: Breanna Webster is a 76 y.o. year old female who is a primary care patient of [name], NP. The care management team was consulted for assistance with disease management and care coordination needs.    Engaged with patient by telephone for follow up visit in response to provider referral for case management and/or care coordination services.   Consent to Services:  The patient was given information about Chronic Care Management services, agreed to services, and gave verbal consent prior to initiation of services.  Please see initial visit note for detailed documentation.   Patient agreed to services and verbal consent obtained.   Assessment: Review of patient past medical history, allergies, medications, health status, including review of consultants reports, laboratory and other test data, was performed as part of comprehensive evaluation and provision of chronic care management services.   SDOH (Social Determinants of Health) assessments and interventions performed:    CCM Care Plan  Allergies  Allergen Reactions   Statins Other (See Comments)    Muscle aches    Outpatient Encounter Medications as of 05/23/2021  Medication Sig   amitriptyline (ELAVIL) 25 MG tablet Take 1 tablet (25 mg total) by mouth at bedtime.   amoxicillin-clavulanate (AUGMENTIN) 875-125 MG tablet Take 1 tablet by mouth twice daily for 10 days   apixaban (ELIQUIS) 5 MG TABS tablet TAKE ONE TABLET BY MOUTH TWICE A DAY   b complex vitamins capsule Take 1 capsule by mouth daily.   BD PEN NEEDLE NANO 2ND GEN 32G X 4 MM MISC    cholecalciferol (VITAMIN D) 25 MCG (1000 UNIT) tablet Take 1,000 Units by mouth daily.   diltiazem (CARDIZEM CD) 120 MG 24 hr capsule Take 1 capsule (120 mg total) by mouth daily.   escitalopram (LEXAPRO) 20 MG tablet TAKE ONE TABLET BY MOUTH DAILY   estradiol  (ESTRACE) 0.1 MG/GM vaginal cream Place vaginally.   estradiol (ESTRACE) 0.1 MG/GM vaginal cream Place 1 applicatorful into the vagina 2 nights per week   Evolocumab (REPATHA SURECLICK) 564 MG/ML SOAJ Inject 1 dose into the skin every 14 (fourteen) days.   famotidine (PEPCID) 20 MG tablet Take 1 tablet by mouth every night at bedtime   glucose blood test strip Use to check blood sugar 3 times daily   Insulin Pen Needle 32G X 4 MM MISC Use to inject 4 times daily   Insulin Syringe-Needle U-100 (ULTICARE INSULIN SYRINGE) 31G X 5/16" 1 ML MISC FOUR TIMES A DAY   Insulin Syringe-Needle U-100 31G X 5/16" 1 ML MISC Use for injections 4 times daily   Lancets (ONETOUCH DELICA PLUS [id number]) MISC USE TO CHECK BLOOD SUGAR TWICE DAILY.Marland Kitchen   Lancets (ONETOUCH DELICA PLUS [id number]) MISC Use 1 lancet 2 times daily   Lancets (ONETOUCH DELICA PLUS [id number]) MISC Use 1 lancet 2 times daily   levothyroxine (SYNTHROID) 125 MCG tablet Take 1 tablet by mouth every morning on an empty stomach   levothyroxine (SYNTHROID) 137 MCG tablet Take 137 mcg by mouth once.   levothyroxine (SYNTHROID) 137 MCG tablet Take 1 tablet by mouth every morning on an empty stomach   losartan (COZAAR) 50 MG tablet TAKE ONE TABLET BY MOUTH DAILY   metFORMIN (GLUCOPHAGE-XR) 500 MG 24 hr tablet Take 500 mg by mouth 2 (two) times daily.   metFORMIN (GLUCOPHAGE-XR) 500 MG 24 hr tablet Take 1 tablet by mouth 2 times  daily   mirabegron ER (MYRBETRIQ) 50 MG TB24 tablet Take 1 tablet by mouth once daily.   mirabegron ER (MYRBETRIQ) 50 MG TB24 tablet Take 50 mg by mouth daily.   Multiple Vitamin (MULITIVITAMIN WITH MINERALS) TABS Take 1 tablet by mouth daily.   nitroGLYCERIN (NITROSTAT) 0.4 MG SL tablet Place 1 tablet (0.4 mg total) under the tongue every 5 (five) minutes as needed for chest pain.   NOVOLIN N 100 UNIT/ML injection Inject 28 Units into the skin daily before breakfast.   NOVOLIN R 100 UNIT/ML injection 8 Units 2 (two) times daily  before a meal. 10 UNITS THIRTY MINUTES BEFORE MEALS.  5 ADDITIONAL UNITS WITH CARBS OR SNACKS. (04/26/2021:  Pt reports she only takes the 8 units 2x/daily before meals, not the 10 units)   omeprazole (PRILOSEC) 20 MG capsule Take 1 capsule (20 mg total) by mouth 2 (two) times daily before a meal.   omeprazole (PRILOSEC) 20 MG capsule Take 1 capsule by mouth twice daily before meals   OneTouch Delica Lancets 35T MISC Tests BS 2 times   ONETOUCH VERIO test strip USE TO MONITOR GLUCOSE LEVELS TWICE DAILY   No facility-administered encounter medications on file as of 05/23/2021.    Patient Active Problem List   Diagnosis Date Noted   COVID-19 02/14/2021   Postviral fatigue syndrome 02/14/2021   Difficulty with adaptive servo-ventilation (ASV) use 02/14/2021   Sepsis secondary to UTI ([place]) 09/08/2020   Elevated ALT measurement 09/08/2020   History of CVA with residual deficit 09/08/2020   Hyperbilirubinemia 09/08/2020   Fatigue associated with anemia 08/03/2020   Cerebrovascular accident (CVA) due to embolism of right posterior cerebral artery ([place]) 08/03/2020   OSA treated with BiPAP 08/03/2020   Complex sleep apnea syndrome 08/03/2020   Treatment-emergent central sleep apnea 08/03/2020   Chronic intermittent hypoxia with obstructive sleep apnea 04/21/2020   OSA (obstructive sleep apnea) 04/21/2020   History of cardioembolic stroke 01/77/9390   Gait disturbance, post-stroke 03/29/2020   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 ([place]) 03/29/2020   Anxiety    RLQ abdominal pain 10/22/2019   Paroxysmal atrial fibrillation ([place]) 05/22/2019   Iron deficiency anemia 05/07/2019   Atrial fibrillation with RVR ([place]) 10/21/2018   Cerebellar stroke, acute ([place]) 10/21/2018   Streptococcal endocarditis    Endocarditis of mitral valve 10/07/2018   Bacteremia due to Streptococcus Salivarius 10/07/2018   Sepsis ([place]) 30/12/2328   Acute metabolic encephalopathy 07/62/2633   Severe aortic  stenosis 07/08/2018   S/P TAVR (transcatheter aortic valve replacement) 07/08/2018   Esophageal thickening    CAD in native artery 04/22/2018   Gastroesophageal reflux disease    Pulmonary hypertension ([place]) 02/21/2018   Essential hypertension 07/15/2017   History of colonic polyps 05/22/2017   Elevated liver function tests 12/05/2016   DOE (dyspnea on exertion) 07/19/2016   Thalassemia minor 05/29/2016   Left bundle branch block 12/06/2015   Upper airway cough syndrome 35/45/6256   Diastolic heart failure ([place]) 10/10/2015   Myalgia 02/17/2014   Carotid artery stenosis 06/05/2013   Eustachian tube dysfunction 05/07/2013   Neuropathy of leg 03/07/2012   Anemia, unspecified 01/31/2012   Hot flashes 08/24/2010   Diabetes mellitus due to underlying condition, uncontrolled 06/30/2010   Generalized abdominal pain 03/24/2010   Obesity (BMI 30.0-34.9) 12/21/2009   IBS (irritable bowel syndrome) 04/29/2009   Vitamin D deficiency 03/10/2009   Acute bronchitis 03/07/2009   Hypothyroidism 12/13/2008   Dyslipidemia 12/13/2008   Anxiety state 12/13/2008   Other  specified disorders of bladder 12/13/2008    Conditions to be addressed/monitored:Atrial Fibrillation, CAD, HTN, HLD, DMII, and hx CVA  Care Plan : RN Care Manager Plan of Care  Updates made by [name], RN since 05/23/2021 12:00 AM     Problem: Chronic Disease Management and Care Coordination Needs (DM2, CAD, Afib,HTN. Hx CVA )   Priority: High     Long-Range Goal: Establish Plan of Care for Chronic Disease Management Needs (DM2, CAD, Afib,HTN. Hx CVA )   Start Date: 03/09/2021  Expected End Date: 09/05/2021  Recent Progress: On track  Priority: High  Note:   Current Barriers:  Care Coordination needs related to Financial constraints related to cost of medications Chronic Disease Management support and education needs related to Atrial Fibrillation, CAD, HTN, DMII, GERD, Overactive Bladder, and hx CVA Pt states  she is going to neuro PT and water therapy. States her biggest issue is feeling tired all of the time.  States she is sleeping OK.  States she does have to get up once a night to urinate.  Denies any chest pains but does get winded with exertion.  States she has had one fall recently where she fell out of her office chair with no injury.  States her CBGs have  AM ranges of 102-154 and PM 64-209.  States she has only had one lower reading of 64 when she did not wake up from her nap soon enough. States her higher reading usually are when they have eaten lunch out.  States she does not use her walker in her home and usually leans on her husband or furniture when she moves around  [place]):  Patient will verbalize understanding of plan for management of Atrial Fibrillation, CAD, HTN, DMII, GERD, Overactive Bladder, and hx CVA as evidenced by voiced adherence to plan of care verbalize basic understanding of  Atrial Fibrillation, CAD, HTN, DMII, GERD, Overactive Bladder, and hx CVA disease process and self health management plan as evidenced by voiced understanding and teach back take all medications exactly as prescribed and will call provider for medication related questions as evidenced by dispense report and pt verbalization attend all scheduled medical appointments:  neuro PT 05/26/21, Dr. Melvyn Novas 06/19/21, Neurology 06/07/21, Hematology 09/06/21, CCM PharmD 11/06/21 as evidenced by medical records demonstrate Improved adherence to prescribed treatment plan for Atrial Fibrillation, CAD, HTN, DMII, GERD, Overactive Bladder, and hx CVA as evidenced by readings within limits, voiced adherence to plan of care continue to work with RN Care Manager to address care management and care coordination needs related to  Atrial Fibrillation, CAD, HTN, DMII, GERD, Overactive Bladder, and hx CVA as evidenced by adherence to CM Team Scheduled appointments work with pharmacist to address Financial constraints related to  cost of Myrbetriq and Eliquis related toAtrial Fibrillation, CAD, HTN, DMII, GERD, Overactive Bladder, and hx CVA as evidenced by review or EMR and patient or pharmacist report through collaboration with RN Care manager, provider, and care team.   Interventions: 1:1 collaboration with primary care provider regarding development and update of comprehensive plan of care as evidenced by provider attestation and co-signature Inter-disciplinary care team collaboration (see longitudinal plan of care) Evaluation of current treatment plan related to  self management and patient's adherence to plan as established by provider  Falls Interventions:  (Status:  Goal on track:  Yes.) Long Term Goal Advised patient of importance of notifying provider of falls Assessed for signs and symptoms of orthostatic hypotension Assessed for falls since  last encounter Assessed patients knowledge of fall risk prevention secondary to previously provided education Reinforced to use her walker if she feels unsteady     AFIB Interventions: (Status:  Goal on track:  Yes.) Long Term Goal   Counseled on increased risk of stroke due to Afib and benefits of anticoagulation for stroke prevention Reviewed importance of adherence to anticoagulant exactly as prescribed Counseled on bleeding risk associated with Eliquis and importance of self-monitoring for signs/symptoms of bleeding Counseled on seeking medical attention after a head injury or if there is blood in the urine/stool   CAD Interventions: (Status:  Goal on track:  Yes.) Long Term Goal Assessed understanding of CAD diagnosis Medications reviewed including medications utilized in CAD treatment plan Provided education on Importance of limiting foods high in cholesterol Reviewed Importance of taking all medications as prescribed Advised to report any changes in symptoms or exercise tolerance   Diabetes Interventions:  (Status:  Goal on track:  Yes.) Long Term  Goal Assessed patient's understanding of A1c goal: <7% Provided education to patient about basic DM disease process Reviewed medications with patient and discussed importance of medication adherence Counseled on importance of regular laboratory monitoring as prescribed Discussed plans with patient for ongoing care management follow up and provided patient with direct contact information for care management team Advised patient, providing education and rationale, to check cbg twice a day and record, calling provider for findings outside established parameters Reviewed to try to eat less pasta when eating out and to have a salad or vegetables Lab Results  Component Value Date   HGBA1C 7.4 (H) 09/08/2020   Hyperlipidemia Interventions:  (Status:  Goal on track:  Yes.) Long Term Goal Medication review performed; medication list updated in electronic medical record.  Provider established cholesterol goals reviewed Counseled on importance of regular laboratory monitoring as prescribed Reviewed role and benefits of statin for ASCVD risk reduction Reviewed importance of limiting foods high in cholesterol  Hypertension Interventions:  (Status:  Goal on track:  Yes.) Long Term Goal Last practice recorded BP readings:  BP Readings from Last 3 Encounters:  04/27/21 118/60  04/26/21 (!) 116/58  04/14/21 116/70  Most recent eGFR/CrCl: No results found for: EGFR  No components found for: CRCL  Evaluation of current treatment plan related to hypertension self management and patient's adherence to plan as established by provider Provided education to patient re: stroke prevention, s/s of heart attack and stroke Advised patient, providing education and rationale, to monitor blood pressure daily and record, calling PCP for findings outside established parameters Provided education on prescribed diet low sodium low CHO heart healthy  Stroke:  (Status:Goal on track:  Yes.) Long Term Goal Reviewed  Importance of taking all medications as prescribed Advised to report any changes in symptoms or exercise tolerance Assessed for management of bladder and/or bowel incontinence Reviewed to continue PT to help with balance and strength    Urinary frequency Goal on track:  Yes. Long Term Goal Evaluation of current treatment plan related to Overactive Bladder, Financial constraints related to cost of Myrbetric  self-management and patient's adherence to plan as established by provider. Discussed plans with patient for ongoing care management follow up and provided patient with direct contact information for care management team Evaluation of current treatment plan related to Urinary frequency and patient's adherence to plan as established by provider Discussed plans with patient for ongoing care management follow up and provided patient with direct contact information for care management team Reviewed to limit  fluids later in the evening to help decrease night time trips to the bathroom   Patient Goals/Self-Care Activities: Take all medications as prescribed Attend all scheduled provider appointments Call pharmacy for medication refills 3-7 days in advance of running out of medications Call provider office for new concerns or questions  keep appointment with eye doctor check blood sugar at prescribed times: twice daily and when you have symptoms of low or high blood sugar check pulse (heart) rate once a day make a plan to eat healthy take medicine as prescribed check blood pressure daily choose a place to take my blood pressure (home, clinic or office, retail store) call doctor for signs and symptoms of high blood pressure keep all doctor appointments eat more whole grains, fruits and vegetables, lean meats and healthy fats call for medicine refill 2 or 3 days before it runs out take all medications exactly as prescribed call doctor with any symptoms you believe are related to your  medicine Follow Up Plan:  Telephone follow up appointment with care management team member scheduled for:  06/16/21 The patient has been provided with contact information for the care management team and has been advised to call with any health related questions or concerns.         Plan:Telephone follow up appointment with care management team member scheduled for:  06/16/21 The patient has been provided with contact information for the care management team and has been advised to call with any health related questions or concerns.  Peter Garter RN, Jackquline [place], CDE Care Management Coordinator [facility] [phone]

## 2021-05-23 NOTE — Patient Instructions (Signed)
Visit Information  Thank you for taking time to visit with me today. Please don't hesitate to contact me if I can be of assistance to you before our next scheduled telephone appointment.  Following are the goals we discussed today:  Take all medications as prescribed Attend all scheduled provider appointments Call pharmacy for medication refills 3-7 days in advance of running out of medications Call provider office for new concerns or questions  keep appointment with eye doctor check blood sugar at prescribed times: twice daily and when you have symptoms of low or high blood sugar check pulse (heart) rate once a day make a plan to eat healthy take medicine as prescribed check blood pressure daily choose a place to take my blood pressure (home, clinic or office, retail store) call doctor for signs and symptoms of high blood pressure keep all doctor appointments eat more whole grains, fruits and vegetables, lean meats and healthy fats call for medicine refill 2 or 3 days before it runs out take all medications exactly as prescribed call doctor with any symptoms you believe are related to your medicine  Our next appointment is by telephone on 06/16/21 at 10:45 AM  Please call the care guide team at [phone] if you need to cancel or reschedule your appointment.   If you are experiencing a Mental Health or [place] or need someone to talk to, please call the Suicide and Crisis Lifeline: 988 call the [place] National Suicide Prevention Lifeline: 614-209-4473 or TTY: 279 643 7771 TTY 212-826-5575) to talk to a trained counselor call 1-800-273-TALK (toll free, 24 hour hotline) go to [facility] [address], [place] [phone]) call 911   Patient verbalizes understanding of instructions and care plan provided today and agrees to view in [place]. Active MyChart status confirmed with patient.    Peter Garter RN, Jackquline [place],  CDE Care Management Coordinator [facility] [phone]

## 2021-05-23 NOTE — Therapy (Signed)
[facility] [address] [place], [place], [address] Phone: [phone]   Fax:  [phone]  Physical Therapy Treatment  Patient Details  Name: Breanna Webster MRN: [id number] Date of Birth: [birth year] Referring Provider (PT): Dohmeier, Asencion Partridge   Encounter Date: 05/22/2021   PT End of Session - 05/23/21 1433     Visit Number 6    Number of Visits 17    Date for PT Re-Evaluation 06/23/21    Authorization Type Medicare/Generic Commercial    Progress Note Due on Visit 10    PT Start Time 9518    PT Stop Time 1530    PT Time Calculation (min) 45 min    Equipment Utilized During Treatment Other (comment)   large yellow noodle, small bar bells   Activity Tolerance Patient tolerated treatment well    Behavior During Therapy [facility] for tasks assessed/performed             Past Medical History:  Diagnosis Date   Anxiety    Arthritis    "back" (04/22/2018)   Back pain    Blood transfusion without reported diagnosis    CAD (coronary artery disease)    a. 03/2018 s/p PCI/DES to the RCA (3.0x15 Onyx DES).   Carotid artery stenosis    Mild   Chest pain    Chronic lower back pain    Cirrhosis (HCC)    Colon polyps    Diverticulitis    Diverticulosis    Esophageal thickening    seen on pre TAVR CT scan, also questionable cirrhosis. MRI recommended. Will refer to GI   Fatty liver    GERD (gastroesophageal reflux disease)    Grave's disease    History of colonic polyps 05/22/2017   History of hiatal hernia    Hypertension    Hypothyroidism    IBS (irritable bowel syndrome)    Osteopenia    Pulmonary nodules    seen on pre TAVR CT. likley benign. no follow up recommended if pt low risk.   S/P TAVR (transcatheter aortic valve replacement)    Severe aortic stenosis    Shortness of breath on exertion    Stroke ([place])    Thalassemia minor    Thyroid disease    Type II diabetes mellitus ([place])     Past Surgical  History:  Procedure Laterality Date   40 HOUR [place] STUDY N/A 03/03/2018   Procedure: 24 HOUR PH STUDY;  Surgeon: Mauri Pole, MD;  Location: [facility];  Service: Endoscopy;  Laterality: N/A;   AORTIC VALVE REPLACEMENT  06/2018   COLONOSCOPY     COLONOSCOPY W/ BIOPSIES AND POLYPECTOMY     CORONARY ANGIOGRAPHY Right 04/21/2018   Procedure: CORONARY ANGIOGRAPHY (CATH LAB);  Surgeon: [name], MD;  Location: [facility];  Service: Cardiovascular;  Laterality: Right;   CORONARY STENT INTERVENTION N/A 04/22/2018   Procedure: CORONARY STENT INTERVENTION;  Surgeon: [name], MD;  Location: [facility];  Service: Cardiovascular;  Laterality: N/A;   DILATION AND CURETTAGE OF UTERUS     ESOPHAGEAL MANOMETRY N/A 03/03/2018   Procedure: ESOPHAGEAL MANOMETRY (EM);  Surgeon: Mauri Pole, MD;  Location: [facility];  Service: Endoscopy;  Laterality: N/A;   GASTRIC FUNDOPLICATION     HERNIA REPAIR     HYSTEROSCOPY     fibroids   LAPAROSCOPIC CHOLECYSTECTOMY     LAPAROSCOPY     fibroids   NISSEN FUNDOPLICATION  8416S   POLYPECTOMY  RIGHT/LEFT HEART CATH AND CORONARY ANGIOGRAPHY N/A 02/20/2018   Procedure: RIGHT/LEFT HEART CATH AND CORONARY ANGIOGRAPHY;  Surgeon: [name], MD;  Location: [facility];  Service: Cardiovascular;  Laterality: N/A;   TEE WITHOUT CARDIOVERSION N/A 07/08/2018   Procedure: TRANSESOPHAGEAL ECHOCARDIOGRAM (TEE);  Surgeon: Burnell Blanks, MD;  Location: [facility];  Service: Open Heart Surgery;  Laterality: N/A;   TEE WITHOUT CARDIOVERSION  10/07/2018   TEE WITHOUT CARDIOVERSION N/A 10/07/2018   Procedure: TRANSESOPHAGEAL ECHOCARDIOGRAM (TEE);  Surgeon: Jerline Pain, MD;  Location: [facility] ENDOSCOPY;  Service: Cardiovascular;  Laterality: N/A;   TONSILLECTOMY     TRANSCATHETER AORTIC VALVE REPLACEMENT, TRANSFEMORAL N/A 07/08/2018   Procedure: TRANSCATHETER AORTIC VALVE REPLACEMENT, TRANSFEMORAL;  Surgeon: Burnell Blanks, MD;  Location: [facility];  Service: Open Heart Surgery;  Laterality: N/A;    There were no vitals filed for this visit.   Subjective Assessment - 05/23/21 0624     Subjective Pt reports she slid out of her office chair a couple of days ago- states she was trying to get up and the wheels on it made it slide out from under her - did not get hurt; pt presents for 2nd aquatic therapy session at [facility]    Pertinent History significant arthritis in knees    Patient Stated Goals Pt's goals for therapy are to get better with walking and with arthritis.    Currently in Pain? No/denies                Aquatic therapy at [facility] 84 degrees  Patient seen for aquatic therapy today.  Treatment took place in water 3.5-4.5 feet deep depending upon activity.  Pt entered & exited the pool  the pool via step negotiation, using a step by step sequence with use of bil. Hand rails with SBA.  Pt performed water walking 18' x 4 reps forwards across width of pool using large yellow noodle for UE support with CGA; 18' x 2 reps with UE support with small bar bells  Pt performed sideways ambulation 18' x 4 reps and then backwards amb.  35' X 2 reps with UE support on pool noodle with CGA to min assist  Pt performed marching in place 10 reps with bil. UE support on noodle  Pt performed sit to stand transfer from bench in pool in 3.5' water depth to noodle with SBA - no difficulty with this transfer in today's session  Pt performed standing balance exercises with UE support on edge of pool prn with CGA - marching in place 10 reps each leg; forward, back and side kicks - alternating legs  Pt performed alternating tap ups to 1st step in 3.8" water depth 10 reps each leg with minimal UE support on rail with CGA for assistance with balance  Pt requires buoyancy of water for support and safety with standing balance exercises; exercises are able to be safely  attempted/performed in water without or with minimal UE support that cannot be performed safely on land.  Viscosity of water is needed for resistance for strengthening.  Current of water provides perturbations for challenges with standing balance.  Buoyancy of water also provides joint offloading which increases comfort and reduces pain in knees with weight bearing activities and exs in water, whereas, this is unable to be achieved with weight bearing exercises on land.  PT Short Term Goals - 05/23/21 1638       PT SHORT TERM GOAL #1   Title Pt will be independent with HEP for improved strength and flexibility, balance, transfers, and gait.   TARGET 05/26/2021    Time 4    Period Weeks    Status On-going      PT SHORT TERM GOAL #2   Title Pt will improve Berg score to at least 41/56 to decrease fall risk.    Baseline 36/56    Time 4    Period Weeks    Status On-going      PT SHORT TERM GOAL #3   Title Pt will improve TUG score to less than or equal to 21 sec for decreased fall risk.    Baseline 26.88 sec    Time 4    Period Weeks    Status On-going      PT SHORT TERM GOAL #4   Title Pt will verbalize understanding of fall prevention in home environment.    Time 4    Period Weeks    Status On-going               PT Long Term Goals - 05/23/21 1639       PT LONG TERM GOAL #1   Title Pt will be independent with HEP for improved flexibility and strength, balance, transfers, and gait.  TARGET 06/23/2021    Time 9    Period Weeks    Status On-going      PT LONG TERM GOAL #2   Title Pt will improve Berg score to at least 46/56 to decrease fall risk.    Time 9    Period Weeks    Status On-going      PT LONG TERM GOAL #3   Title Pt will improve TUG score to less than or equal to 16 sec for decreased fall risk.    Time 9    Period Weeks    Status On-going      PT LONG TERM GOAL #4   Title Pt will improve gait  velocity to at least 2 ft/sec for improved gait efficiency and safety.    Baseline 1.73 ft/sec    Time 9    Period Weeks    Status On-going      PT LONG TERM GOAL #5   Title Pt will verbalize plans for continued community fitness upon d/c to maximize gains made in PT.    Time 9    Period Weeks    Status On-going                   Plan - 05/23/21 1434     Clinical Impression Statement Aquatic therapy session focused on gait training in 4' water depth with UE support on large yellow noodle and also on balance exercises with CGA to min assist.  Pt somewhat hesitant in performing amb. without UE support due to fear/anxiety with pt reporting that she doesn't swim.  Pt needs UE support for balance exercises for safety.  Pt has decreased LLE SLS compared to RLE.  Cont with POC.    Personal Factors and Comorbidities Comorbidity 3+    Comorbidities CVA due to embolism R PCA; COVID -19, post-viral fatigue syndrome  PMH COVID 11/2020, arthritis, back pain, CAD, cirrhosis, diverticulitis/osis, GERD, HTN, IBS, osteopenia, PAF    Examination-Activity Limitations Locomotion Level;Transfers;Stand;Caring for Others;Dressing    Examination-Participation Restrictions Meal Prep;Cleaning;Community Activity;Laundry    Stability/Clinical Decision  Making Evolving/Moderate complexity    Rehab Potential Good    PT Frequency 2x / week    PT Duration 8 weeks   plus eval, so 9 wks totatl including eval week; will likely try for 1x/wk pool and 1x/wk in-clinic   PT Treatment/Interventions ADLs/Self Care Home Management;Aquatic Therapy;DME Instruction;Neuromuscular re-education;Balance training;Therapeutic exercise;Therapeutic activities;Functional mobility training;Gait training;Patient/family education;Manual techniques    PT Next Visit Plan Check STGs.  Work on standing balance, gait activities.  Gait training with cane outdoor surfaces; work on timing/coordination with LLE hip/knee flexion with gait.     Consulted and Agree with Plan of Care Patient             Patient will benefit from skilled therapeutic intervention in order to improve the following deficits and impairments:  Abnormal gait, Difficulty walking, Impaired flexibility, Decreased balance, Decreased mobility, Decreased strength, Postural dysfunction, Pain  Visit Diagnosis: Unsteadiness on feet  Other abnormalities of gait and mobility  Muscle weakness (generalized)     Problem List Patient Active Problem List   Diagnosis Date Noted   COVID-19 02/14/2021   Postviral fatigue syndrome 02/14/2021   Difficulty with adaptive servo-ventilation (ASV) use 02/14/2021   Sepsis secondary to UTI ([place]) 09/08/2020   Elevated ALT measurement 09/08/2020   History of CVA with residual deficit 09/08/2020   Hyperbilirubinemia 09/08/2020   Fatigue associated with anemia 08/03/2020   Cerebrovascular accident (CVA) due to embolism of right posterior cerebral artery ([place]) 08/03/2020   OSA treated with BiPAP 08/03/2020   Complex sleep apnea syndrome 08/03/2020   Treatment-emergent central sleep apnea 08/03/2020   Chronic intermittent hypoxia with obstructive sleep apnea 04/21/2020   OSA (obstructive sleep apnea) 04/21/2020   History of cardioembolic stroke 97/94/8016   Gait disturbance, post-stroke 03/29/2020   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 ([place]) 03/29/2020   Anxiety    RLQ abdominal pain 10/22/2019   Paroxysmal atrial fibrillation ([place]) 05/22/2019   Iron deficiency anemia 05/07/2019   Atrial fibrillation with RVR ([place]) 10/21/2018   Cerebellar stroke, acute ([place]) 10/21/2018   Streptococcal endocarditis    Endocarditis of mitral valve 10/07/2018   Bacteremia due to Streptococcus Salivarius 10/07/2018   Sepsis ([place]) 55/37/4827   Acute metabolic encephalopathy 07/86/7544   Severe aortic stenosis 07/08/2018   S/P TAVR (transcatheter aortic valve replacement) 07/08/2018   Esophageal thickening    CAD in  native artery 04/22/2018   Gastroesophageal reflux disease    Pulmonary hypertension ([place]) 02/21/2018   Essential hypertension 07/15/2017   History of colonic polyps 05/22/2017   Elevated liver function tests 12/05/2016   DOE (dyspnea on exertion) 07/19/2016   Thalassemia minor 05/29/2016   Left bundle branch block 12/06/2015   Upper airway cough syndrome 92/04/69   Diastolic heart failure ([place]) 10/10/2015   Myalgia 02/17/2014   Carotid artery stenosis 06/05/2013   Eustachian tube dysfunction 05/07/2013   Neuropathy of leg 03/07/2012   Anemia, unspecified 01/31/2012   Hot flashes 08/24/2010   Diabetes mellitus due to underlying condition, uncontrolled 06/30/2010   Generalized abdominal pain 03/24/2010   Obesity (BMI 30.0-34.9) 12/21/2009   IBS (irritable bowel syndrome) 04/29/2009   Vitamin D deficiency 03/10/2009   Acute bronchitis 03/07/2009   Hypothyroidism 12/13/2008   Dyslipidemia 12/13/2008   Anxiety state 12/13/2008   Other specified disorders of bladder 12/13/2008    Breanna Webster, PT 05/23/2021, 4:40 PM  [facility] [address] [place], [place], [address] Phone: [phone]   Fax:  [phone]  Name: Breanna Webster MRN: [id number] Date of Birth: 1945/12/12

## 2021-05-24 ENCOUNTER — Ambulatory Visit: Payer: Medicare Other | Admitting: Physical Therapy

## 2021-05-24 ENCOUNTER — Other Ambulatory Visit (HOSPITAL_BASED_OUTPATIENT_CLINIC_OR_DEPARTMENT_OTHER): Payer: Self-pay

## 2021-05-26 ENCOUNTER — Encounter: Payer: Self-pay | Admitting: Occupational Therapy

## 2021-05-26 ENCOUNTER — Ambulatory Visit: Payer: Medicare Other | Admitting: Occupational Therapy

## 2021-05-26 ENCOUNTER — Encounter: Payer: Self-pay | Admitting: Physical Therapy

## 2021-05-26 ENCOUNTER — Other Ambulatory Visit: Payer: Self-pay

## 2021-05-26 ENCOUNTER — Ambulatory Visit: Payer: Medicare Other | Attending: Neurology | Admitting: Physical Therapy

## 2021-05-26 DIAGNOSIS — R29898 Other symptoms and signs involving the musculoskeletal system: Secondary | ICD-10-CM

## 2021-05-26 DIAGNOSIS — R2689 Other abnormalities of gait and mobility: Secondary | ICD-10-CM

## 2021-05-26 DIAGNOSIS — R2681 Unsteadiness on feet: Secondary | ICD-10-CM

## 2021-05-26 DIAGNOSIS — M6281 Muscle weakness (generalized): Secondary | ICD-10-CM | POA: Diagnosis not present

## 2021-05-26 DIAGNOSIS — R278 Other lack of coordination: Secondary | ICD-10-CM

## 2021-05-26 NOTE — Therapy (Signed)
[facility] [address] [place], [place], [address] Phone: [phone]   Fax:  [phone]  Occupational Therapy Evaluation  Patient Details  Name: Breanna Webster MRN: [id number] Date of Birth: 08/31/1945 Referring Provider (OT): Dohmeier, Asencion Partridge, MD   Encounter Date: 05/26/2021   OT End of Session - 05/26/21 1035     Visit Number 1    Number of Visits 17    Date for OT Re-Evaluation 07/21/21    Authorization Type Medicare A and B    Authorization - Visit Number 1    Authorization - Number of Visits 17    Progress Note Due on Visit 10    OT Start Time [phone]    OT Stop Time 1016    OT Time Calculation (min) 45 min    Activity Tolerance Patient tolerated treatment well    Behavior During Therapy [facility] for tasks assessed/performed             Past Medical History:  Diagnosis Date   Anxiety    Arthritis    "back" (04/22/2018)   Back pain    Blood transfusion without reported diagnosis    CAD (coronary artery disease)    a. 03/2018 s/p PCI/DES to the RCA (3.0x15 Onyx DES).   Carotid artery stenosis    Mild   Chest pain    Chronic lower back pain    Cirrhosis (HCC)    Colon polyps    Diverticulitis    Diverticulosis    Esophageal thickening    seen on pre TAVR CT scan, also questionable cirrhosis. MRI recommended. Will refer to GI   Fatty liver    GERD (gastroesophageal reflux disease)    Grave's disease    History of colonic polyps 05/22/2017   History of hiatal hernia    Hypertension    Hypothyroidism    IBS (irritable bowel syndrome)    Osteopenia    Pulmonary nodules    seen on pre TAVR CT. likley benign. no follow up recommended if pt low risk.   S/P TAVR (transcatheter aortic valve replacement)    Severe aortic stenosis    Shortness of breath on exertion    Stroke ([place])    Thalassemia minor    Thyroid disease    Type II diabetes mellitus ([place])     Past Surgical History:  Procedure Laterality Date    27 HOUR [place] STUDY N/A 03/03/2018   Procedure: 24 HOUR PH STUDY;  Surgeon: Mauri Pole, MD;  Location: [facility];  Service: Endoscopy;  Laterality: N/A;   AORTIC VALVE REPLACEMENT  06/2018   COLONOSCOPY     COLONOSCOPY W/ BIOPSIES AND POLYPECTOMY     CORONARY ANGIOGRAPHY Right 04/21/2018   Procedure: CORONARY ANGIOGRAPHY (CATH LAB);  Surgeon: [name], MD;  Location: [facility];  Service: Cardiovascular;  Laterality: Right;   CORONARY STENT INTERVENTION N/A 04/22/2018   Procedure: CORONARY STENT INTERVENTION;  Surgeon: [name], MD;  Location: [facility];  Service: Cardiovascular;  Laterality: N/A;   DILATION AND CURETTAGE OF UTERUS     ESOPHAGEAL MANOMETRY N/A 03/03/2018   Procedure: ESOPHAGEAL MANOMETRY (EM);  Surgeon: Mauri Pole, MD;  Location: [facility];  Service: Endoscopy;  Laterality: N/A;   GASTRIC FUNDOPLICATION     HERNIA REPAIR     HYSTEROSCOPY     fibroids   LAPAROSCOPIC CHOLECYSTECTOMY     LAPAROSCOPY     fibroids   NISSEN  FUNDOPLICATION  2376E   POLYPECTOMY     RIGHT/LEFT HEART CATH AND CORONARY ANGIOGRAPHY N/A 02/20/2018   Procedure: RIGHT/LEFT HEART CATH AND CORONARY ANGIOGRAPHY;  Surgeon: [name], MD;  Location: [place] [facility];  Service: Cardiovascular;  Laterality: N/A;   TEE WITHOUT CARDIOVERSION N/A 07/08/2018   Procedure: TRANSESOPHAGEAL ECHOCARDIOGRAM (TEE);  Surgeon: Burnell Blanks, MD;  Location: [facility];  Service: Open Heart Surgery;  Laterality: N/A;   TEE WITHOUT CARDIOVERSION  10/07/2018   TEE WITHOUT CARDIOVERSION N/A 10/07/2018   Procedure: TRANSESOPHAGEAL ECHOCARDIOGRAM (TEE);  Surgeon: Jerline Pain, MD;  Location: [facility] ENDOSCOPY;  Service: Cardiovascular;  Laterality: N/A;   TONSILLECTOMY     TRANSCATHETER AORTIC VALVE REPLACEMENT, TRANSFEMORAL N/A 07/08/2018   Procedure: TRANSCATHETER AORTIC VALVE REPLACEMENT, TRANSFEMORAL;  Surgeon: Burnell Blanks, MD;  Location: [facility];   Service: Open Heart Surgery;  Laterality: N/A;    There were no vitals filed for this visit.   Subjective Assessment - 05/26/21 0936     Subjective  Pt reports presenting to OT eval today due to decreased function in LUE with challenges managing clothing fasteners, cutting food and opening containers, decreased core and BLE mobility as needed for LB dressing, and difficulty getting up after fall.  Pt reports deficits s/p CVA but feeling that things are getting worse and not better.    Pertinent History DM2, CAD, HTN, aortic stenosis status post TAVR in 3/20, hypothyroidism    Limitations significant arthritis in knees    Patient Stated Goals to be able to get back in to the swing of things    Currently in Pain? Yes    Pain Score 7     Pain Location Knee    Pain Orientation Right    Pain Descriptors / Indicators Aching;Sharp    Pain Type Chronic pain    Pain Onset More than a month ago    Pain Frequency Intermittent    Aggravating Factors  increased activity    Pain Relieving Factors pain cream, tylenol               [facility] OT Assessment - 05/26/21 0940       Assessment   Medical Diagnosis Cerebrovascular accident (CVA) due to embolism of right posterior cerebral artery    Referring Provider (OT) Dohmeier, Asencion Partridge, MD    Onset Date/Surgical Date 12/28/20   MD order   Hand Dominance Right    Next MD Visit 06/07/2021    Prior Therapy currently in PT, post CVA in 2020      Precautions   Precautions Fall;Other (comment)    Precaution Comments No driving      Balance Screen   Has the patient fallen in the past 6 months Yes    How many times? 3    Has the patient had a decrease in activity level because of a fear of falling?  Yes    Is the patient reluctant to leave their home because of a fear of falling?  No      Home  Environment   Family/patient expects to be discharged to: Private residence    Living Arrangements Spouse/significant other    Available Help at Discharge  Family    Type of [place] Access Level entry    [place] One level    Bathroom Building control surveyor   built in Civil engineer, contracting, grab bars in Production manager  seat - built in;Grab bars - tub/shower      Prior Function   Level of Independence Independent    Vocation Retired    Leisure Risk analyst, making dinner      ADL   Eating/Feeding Needs assist with cutting food    Grooming Independent    Upper Body Bathing Independent    Lower Body Bathing Independent    Upper Body Dressing Needs assist for fasteners   husband assists with zippers and buttons, occasional assist to pull items over head   Lower Body Dressing Increased time;Modified independent    Toilet Transfer Modified independent    Toileting - Clothing Manipulation Modified independent    [place] Transfer Modified independent      IADL   Shopping Assistance for transportation   pushes the cart, husband gathers the items   Light Housekeeping Does personal laundry completely;Performs light daily tasks such as dishwashing, bed making    Meal Prep --   does the cooking, but husband does the cutting of foods and opening cans   Community Mobility Relies on family or friends for transportation    Medication Management Is responsible for taking medication in correct dosages at correct time    Physiological scientist financial matters independently (budgets, writes checks, pays rent, bills goes to bank), collects and keeps track of income      Written Expression   Dominant Hand Right      Vision - History   Baseline Vision Wears glasses only for reading    Patient Visual Report --   pt reports needing to see an eye surgeon: reports double vision, occasional loss of viistoin for ~1 minute     Vision Assessment   Vision Assessment Vision tested    Tracking/Visual Pursuits Able to track stimulus in all quads  without difficulty    Saccades Decreased speed of saccadic movement    Depth Perception WNL    Comment Pt reports occasional double vision      Posture/Postural Control   Posture/Postural Control Postural limitations    Postural Limitations Forward head;Rounded Shoulders    Posture Comments LUE resting tremo      Sensation   Light Touch Appears Intact    Hot/Cold Appears Intact      Coordination   9 Hole Peg Test Left;Right    Right 9 Hole Peg Test 35.5    Left 9 Hole Peg Test 55.4    Box and Blocks R: 41 and L: 31    Tremors LUE resting tremor      ROM / Strength   AROM / PROM / Strength AROM;Strength      AROM   Overall AROM  Deficits    Overall AROM Comments slow with mild increase in pain with B shoulder internal rotation    AROM Assessment Site Shoulder    Right/Left Shoulder Right;Left    Right Shoulder Extension 133 Degrees    Left Shoulder Flexion 120 Degrees      Strength   Strength Assessment Site Shoulder;Elbow    Right/Left Shoulder Right;Left    Right Shoulder Flexion 4/5    Left Shoulder Flexion 4/5    Right/Left Elbow Right;Left    Right Elbow Flexion 4/5    Right Elbow Extension 4/5    Left Elbow Flexion 4/5    Left Elbow Extension 4/5      Hand Function   Right Hand Grip (lbs) 43  Left Hand Grip (lbs) 30                              OT Education - 05/26/21 1034     Education Details Educated on OT purpose, POC, and goals.    Person(s) Educated Patient    Methods Explanation    Comprehension Verbalized understanding              OT Short Term Goals - 05/26/21 1042       OT SHORT TERM GOAL #1   Title Pt will be independent in [facility] HEP to increase coordination as needed for managing clothing fasteners.    Time 4    Period Weeks    Status New    Target Date 06/23/21      OT SHORT TERM GOAL #2   Title Pt will demonstrate improved fine motor coordination for ADLs as evidenced by decreasing 9 hole peg test score  for LUE by 10 secs    Baseline R: 35.5 and L: 55.4    Time 4    Period Weeks    Status New      OT SHORT TERM GOAL #3   Title Pt will demonstrate increased grip strength in LUE by 5# as needed for opening containers    Baseline R: 43 and L: 30    Time 4    Period Weeks    Status New               OT Long Term Goals - 05/26/21 1047       OT LONG TERM GOAL #1   Title Pt will be independent in completing a full body/routine HEP as needed for increased balance and LB flexibility to increase independence with LB dressing.    Time 8    Period Weeks    Status New    Target Date 07/21/21      OT LONG TERM GOAL #2   Title Pt will demonstrate improved fine motor coordination for ADLs as evidenced by decreasing 9 hole peg test score for LUE by 15 secs    Baseline R: 35.5 and L: 55.4    Time 8    Period Weeks    Status New      OT LONG TERM GOAL #3   Title Pt will demonstrate improved UE functional use for ADLs as evidenced by increasing box/ blocks score by 5 blocks with LUE.    Baseline R; 41 and L: 31    Time 8    Period Weeks    Status New      OT LONG TERM GOAL #4   Title Pt will be independent in energy conservation strategies to increase safety and decrease fall risk.    Time 8    Period Weeks    Status New                   Plan - 05/26/21 1036     Clinical Impression Statement Pt is a 76 y/o female who presents to OP OT due to reports of increased weakness in LUE s/p CVA and post-Covid. Pt reports decreased function in LUE with challenges managing clothing fasteners, cutting food and opening containers, decreased core and BLE mobility as needed for LB dressing, and difficulty getting up after fall. Pt currently lives with spouse in a one story home with level entry and is retired. PMHx includes DM2, CAD, HTN, aortic  stenosis status post TAVR in 3/20, hypothyroidism. Pt will benefit from skilled occupational therapy services to address strength and  coordination, ROM, pain management, balance, GM/FM control, safety awareness, introduction of compensatory strategies/AE prn, visual-perception, and implementation of an HEP to improve participation and safety during ADLs, IADLs, and leisure pursuits.    OT Occupational Profile and History Detailed Assessment- Review of Records and additional review of physical, cognitive, psychosocial history related to current functional performance    Occupational performance deficits (Please refer to evaluation for details): ADL's;IADL's    Body Structure / Function / Physical Skills ADL;Balance;Body mechanics;Coordination;Decreased knowledge of use of DME;Dexterity;Endurance;FMC;GMC;Flexibility;Mobility;Pain;ROM;Strength;UE functional use    Rehab Potential Good    Clinical Decision Making Limited treatment options, no task modification necessary    Comorbidities Affecting Occupational Performance: May have comorbidities impacting occupational performance    Modification or Assistance to Complete Evaluation  No modification of tasks or assist necessary to complete eval    OT Frequency 2x / week    OT Duration 8 weeks    OT Treatment/Interventions Self-care/ADL training;Aquatic Therapy;Biofeedback;Cryotherapy;Electrical Stimulation;Moist Heat;Ultrasound;Therapeutic exercise;Neuromuscular education;Energy conservation;DME and/or AE instruction;Functional Mobility Training;Manual Therapy;Passive range of motion;Therapeutic activities;Patient/family education;Balance training;Coping strategies training    Plan buttons, [place], provide [place] HEP    Consulted and Agree with Plan of Care Patient             Patient will benefit from skilled therapeutic intervention in order to improve the following deficits and impairments:   Body Structure / Function / Physical Skills: ADL, Balance, Body mechanics, Coordination, Decreased knowledge of use of DME, Dexterity, Endurance, FMC, GMC, Flexibility, Mobility, Pain, ROM,  Strength, UE functional use       Visit Diagnosis: Muscle weakness (generalized)  Unsteadiness on feet  Other abnormalities of gait and mobility  Other symptoms and signs involving the musculoskeletal system  Other lack of coordination    Problem List Patient Active Problem List   Diagnosis Date Noted   COVID-19 02/14/2021   Postviral fatigue syndrome 02/14/2021   Difficulty with adaptive servo-ventilation (ASV) use 02/14/2021   Sepsis secondary to UTI ([place]) 09/08/2020   Elevated ALT measurement 09/08/2020   History of CVA with residual deficit 09/08/2020   Hyperbilirubinemia 09/08/2020   Fatigue associated with anemia 08/03/2020   Cerebrovascular accident (CVA) due to embolism of right posterior cerebral artery ([place]) 08/03/2020   OSA treated with BiPAP 08/03/2020   Complex sleep apnea syndrome 08/03/2020   Treatment-emergent central sleep apnea 08/03/2020   Chronic intermittent hypoxia with obstructive sleep apnea 04/21/2020   OSA (obstructive sleep apnea) 04/21/2020   History of cardioembolic stroke 28/76/8115   Gait disturbance, post-stroke 03/29/2020   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 ([place]) 03/29/2020   Anxiety    RLQ abdominal pain 10/22/2019   Paroxysmal atrial fibrillation ([place]) 05/22/2019   Iron deficiency anemia 05/07/2019   Atrial fibrillation with RVR ([place]) 10/21/2018   Cerebellar stroke, acute ([place]) 10/21/2018   Streptococcal endocarditis    Endocarditis of mitral valve 10/07/2018   Bacteremia due to Streptococcus Salivarius 10/07/2018   Sepsis ([place]) 72/62/0355   Acute metabolic encephalopathy 97/41/6384   Severe aortic stenosis 07/08/2018   S/P TAVR (transcatheter aortic valve replacement) 07/08/2018   Esophageal thickening    CAD in native artery 04/22/2018   Gastroesophageal reflux disease    Pulmonary hypertension ([place]) 02/21/2018   Essential hypertension 07/15/2017   History of colonic polyps 05/22/2017   Elevated liver  function tests 12/05/2016   DOE (dyspnea on exertion) 07/19/2016  Thalassemia minor 05/29/2016   Left bundle branch block 12/06/2015   Upper airway cough syndrome 16/01/9603   Diastolic heart failure ([place]) 10/10/2015   Myalgia 02/17/2014   Carotid artery stenosis 06/05/2013   Eustachian tube dysfunction 05/07/2013   Neuropathy of leg 03/07/2012   Anemia, unspecified 01/31/2012   Hot flashes 08/24/2010   Diabetes mellitus due to underlying condition, uncontrolled 06/30/2010   Generalized abdominal pain 03/24/2010   Obesity (BMI 30.0-34.9) 12/21/2009   IBS (irritable bowel syndrome) 04/29/2009   Vitamin D deficiency 03/10/2009   Acute bronchitis 03/07/2009   Hypothyroidism 12/13/2008   Dyslipidemia 12/13/2008   Anxiety state 12/13/2008   Other specified disorders of bladder 12/13/2008    Simonne Come, OT 05/26/2021, 10:52 AM  [facility] [address], [place], [place], [address] Phone: [phone]   Fax:  [phone]  Name: ZULEY LUTTER MRN: [id number] Date of Birth: 09/05/1945

## 2021-05-26 NOTE — Therapy (Signed)
[facility] [address] [place], [place], [address] Phone: [phone]   Fax:  [phone]  Physical Therapy Treatment  Patient Details  Name: Breanna Webster MRN: [id number] Date of Birth: 27-Dec-1945 Referring Provider (PT): Dohmeier, Asencion Partridge   Encounter Date: 05/26/2021   PT End of Session - 05/26/21 1100     Visit Number 7    Number of Visits 17    Date for PT Re-Evaluation 06/23/21    Authorization Type Medicare/Generic Commercial    Progress Note Due on Visit 10    PT Start Time 1018    PT Stop Time 1058    PT Time Calculation (min) 40 min    Equipment Utilized During Treatment Other (comment)   large yellow noodle, small bar bells   Activity Tolerance Patient tolerated treatment well    Behavior During Therapy WFL for tasks assessed/performed             Past Medical History:  Diagnosis Date   Anxiety    Arthritis    "back" (04/22/2018)   Back pain    Blood transfusion without reported diagnosis    CAD (coronary artery disease)    a. 03/2018 s/p PCI/DES to the RCA (3.0x15 Onyx DES).   Carotid artery stenosis    Mild   Chest pain    Chronic lower back pain    Cirrhosis (HCC)    Colon polyps    Diverticulitis    Diverticulosis    Esophageal thickening    seen on pre TAVR CT scan, also questionable cirrhosis. MRI recommended. Will refer to GI   Fatty liver    GERD (gastroesophageal reflux disease)    Grave's disease    History of colonic polyps 05/22/2017   History of hiatal hernia    Hypertension    Hypothyroidism    IBS (irritable bowel syndrome)    Osteopenia    Pulmonary nodules    seen on pre TAVR CT. likley benign. no follow up recommended if pt low risk.   S/P TAVR (transcatheter aortic valve replacement)    Severe aortic stenosis    Shortness of breath on exertion    Stroke ([place])    Thalassemia minor    Thyroid disease    Type II diabetes mellitus ([place])     Past Surgical History:   Procedure Laterality Date   74 HOUR [place] STUDY N/A 03/03/2018   Procedure: 24 HOUR PH STUDY;  Surgeon: Mauri Pole, MD;  Location: [facility];  Service: Endoscopy;  Laterality: N/A;   AORTIC VALVE REPLACEMENT  06/2018   COLONOSCOPY     COLONOSCOPY W/ BIOPSIES AND POLYPECTOMY     CORONARY ANGIOGRAPHY Right 04/21/2018   Procedure: CORONARY ANGIOGRAPHY (CATH LAB);  Surgeon: [name], MD;  Location: [facility];  Service: Cardiovascular;  Laterality: Right;   CORONARY STENT INTERVENTION N/A 04/22/2018   Procedure: CORONARY STENT INTERVENTION;  Surgeon: [name], MD;  Location: [facility];  Service: Cardiovascular;  Laterality: N/A;   DILATION AND CURETTAGE OF UTERUS     ESOPHAGEAL MANOMETRY N/A 03/03/2018   Procedure: ESOPHAGEAL MANOMETRY (EM);  Surgeon: Mauri Pole, MD;  Location: [facility];  Service: Endoscopy;  Laterality: N/A;   GASTRIC FUNDOPLICATION     HERNIA REPAIR     HYSTEROSCOPY     fibroids   LAPAROSCOPIC CHOLECYSTECTOMY     LAPAROSCOPY     fibroids   NISSEN FUNDOPLICATION  8768T  POLYPECTOMY     RIGHT/LEFT HEART CATH AND CORONARY ANGIOGRAPHY N/A 02/20/2018   Procedure: RIGHT/LEFT HEART CATH AND CORONARY ANGIOGRAPHY;  Surgeon: [name], MD;  Location: [facility];  Service: Cardiovascular;  Laterality: N/A;   TEE WITHOUT CARDIOVERSION N/A 07/08/2018   Procedure: TRANSESOPHAGEAL ECHOCARDIOGRAM (TEE);  Surgeon: Burnell Blanks, MD;  Location: [facility];  Service: Open Heart Surgery;  Laterality: N/A;   TEE WITHOUT CARDIOVERSION  10/07/2018   TEE WITHOUT CARDIOVERSION N/A 10/07/2018   Procedure: TRANSESOPHAGEAL ECHOCARDIOGRAM (TEE);  Surgeon: Jerline Pain, MD;  Location: [facility] ENDOSCOPY;  Service: Cardiovascular;  Laterality: N/A;   TONSILLECTOMY     TRANSCATHETER AORTIC VALVE REPLACEMENT, TRANSFEMORAL N/A 07/08/2018   Procedure: TRANSCATHETER AORTIC VALVE REPLACEMENT, TRANSFEMORAL;  Surgeon: Burnell Blanks,  MD;  Location: [facility];  Service: Open Heart Surgery;  Laterality: N/A;    There were no vitals filed for this visit.   Subjective Assessment - 05/26/21 1000     Subjective Enjoying the cold weather. Knees are terrible, like always.    Pertinent History significant arthritis in knees    Patient Stated Goals Pt's goals for therapy are to get better with walking and with arthritis.    Currently in Pain? No/denies                [facility] PT Assessment - 05/26/21 1021       Berg Balance Test   Sit to Stand Able to stand without using hands and stabilize independently    Standing Unsupported Able to stand safely 2 minutes    Sitting with Back Unsupported but Feet Supported on Floor or Stool Able to sit safely and securely 2 minutes    Stand to Sit Sits safely with minimal use of hands    Transfers Able to transfer safely, minor use of hands    Standing Unsupported with Eyes Closed Able to stand 10 seconds safely    Standing Unsupported with Feet Together Needs help to attain position but able to stand for 30 seconds with feet together    From Standing, Reach Forward with Outstretched Arm Can reach forward >5 cm safely (2")    From Standing Position, Pick up Object from Floor Able to pick up shoe, needs supervision    From Standing Position, Turn to Look Behind Over each Shoulder Looks behind from both sides and weight shifts well    Turn 360 Degrees Able to turn 360 degrees safely but slowly    Standing Unsupported, Alternately Place Feet on Step/Stool Able to stand independently and complete 8 steps >20 seconds    Standing Unsupported, One Foot in [employer] balance while stepping or standing    Standing on One Leg Able to lift leg independently and hold 5-10 seconds    Total Score 42      Timed Up and Go Test   TUG Normal TUG    Normal TUG (seconds) 20.97   without AD                               Balance Exercises - 05/26/21 0001       Balance  Exercises: Standing   Other Standing Exercises Comments alt toe tap on cone x20 without UE support and CGA, holding ball in B hands + march x20, R/ forward/back stepping with 1 UE support on handrail 10x each  PT Education - 05/26/21 1059     Education Details update to HEP to be performed at counter for safety- Access Code: [facility]; discussion on objective measurements and remianing impairments; edu on fall prevention handout    Person(s) Educated Patient    Methods Explanation;Demonstration;Tactile cues;Verbal cues;Handout    Comprehension Verbalized understanding;Returned demonstration              PT Short Term Goals - 05/26/21 1102       PT SHORT TERM GOAL #1   Title Pt will be independent with HEP for improved strength and flexibility, balance, transfers, and gait.   TARGET 05/26/2021    Time 4    Period Weeks    Status Achieved      PT SHORT TERM GOAL #2   Title Pt will improve Berg score to at least 41/56 to decrease fall risk.    Baseline 36/56    Time 4    Period Weeks    Status Achieved      PT SHORT TERM GOAL #3   Title Pt will improve TUG score to less than or equal to 21 sec for decreased fall risk.    Baseline 26.88 sec    Time 4    Period Weeks    Status Achieved      PT SHORT TERM GOAL #4   Title Pt will verbalize understanding of fall prevention in home environment.    Time 4    Period Weeks    Status Achieved               PT Long Term Goals - 05/23/21 1639       PT LONG TERM GOAL #1   Title Pt will be independent with HEP for improved flexibility and strength, balance, transfers, and gait.  TARGET 06/23/2021    Time 9    Period Weeks    Status On-going      PT LONG TERM GOAL #2   Title Pt will improve Berg score to at least 46/56 to decrease fall risk.    Time 9    Period Weeks    Status On-going      PT LONG TERM GOAL #3   Title Pt will improve TUG score to less than or equal to 16 sec for decreased fall risk.     Time 9    Period Weeks    Status On-going      PT LONG TERM GOAL #4   Title Pt will improve gait velocity to at least 2 ft/sec for improved gait efficiency and safety.    Baseline 1.73 ft/sec    Time 9    Period Weeks    Status On-going      PT LONG TERM GOAL #5   Title Pt will verbalize plans for continued community fitness upon d/c to maximize gains made in PT.    Time 9    Period Weeks    Status On-going                   Plan - 05/26/21 1102     Clinical Impression Statement Patient arrived to session with report of continued knee pain, but no pain currently. TUG score today improved to 20.97 sec without AD. Still some difficulty with foot clearance and trouble with turns. Patient scored 42/56 on Berg, indicating an increased risk of falls but improved since initial assessment. Most challenge appeared to occur with narrow BOS and SLS. Patient reported understanding of  fall prevention information for max safety. Standing balance activities involved dynamic weight shifting through single LE while providing encouragement to decrease UE support. Patient tolerated session well and without complaints at end of session.    Personal Factors and Comorbidities Comorbidity 3+    Comorbidities CVA due to embolism R PCA; COVID -19, post-viral fatigue syndrome  PMH COVID 11/2020, arthritis, back pain, CAD, cirrhosis, diverticulitis/osis, GERD, HTN, IBS, osteopenia, PAF    Examination-Activity Limitations Locomotion Level;Transfers;Stand;Caring for Others;Dressing    Examination-Participation Restrictions Meal Prep;Cleaning;Community Activity;Laundry    Stability/Clinical Decision Making Evolving/Moderate complexity    Rehab Potential Good    PT Frequency 2x / week    PT Duration 8 weeks   plus eval, so 9 wks totatl including eval week; will likely try for 1x/wk pool and 1x/wk in-clinic   PT Treatment/Interventions ADLs/Self Care Home Management;Aquatic Therapy;DME  Instruction;Neuromuscular re-education;Balance training;Therapeutic exercise;Therapeutic activities;Functional mobility training;Gait training;Patient/family education;Manual techniques    PT Next Visit Plan Work on standing balance, gait activities.  Gait training with cane outdoor surfaces; work on timing/coordination with LLE hip/knee flexion with gait.    Consulted and Agree with Plan of Care Patient             Patient will benefit from skilled therapeutic intervention in order to improve the following deficits and impairments:  Abnormal gait, Difficulty walking, Impaired flexibility, Decreased balance, Decreased mobility, Decreased strength, Postural dysfunction, Pain  Visit Diagnosis: Unsteadiness on feet  Other abnormalities of gait and mobility  Muscle weakness (generalized)  Other symptoms and signs involving the musculoskeletal system     Problem List Patient Active Problem List   Diagnosis Date Noted   COVID-19 02/14/2021   Postviral fatigue syndrome 02/14/2021   Difficulty with adaptive servo-ventilation (ASV) use 02/14/2021   Sepsis secondary to UTI ([place]) 09/08/2020   Elevated ALT measurement 09/08/2020   History of CVA with residual deficit 09/08/2020   Hyperbilirubinemia 09/08/2020   Fatigue associated with anemia 08/03/2020   Cerebrovascular accident (CVA) due to embolism of right posterior cerebral artery ([place]) 08/03/2020   OSA treated with BiPAP 08/03/2020   Complex sleep apnea syndrome 08/03/2020   Treatment-emergent central sleep apnea 08/03/2020   Chronic intermittent hypoxia with obstructive sleep apnea 04/21/2020   OSA (obstructive sleep apnea) 04/21/2020   History of cardioembolic stroke 70/04/7492   Gait disturbance, post-stroke 03/29/2020   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 ([place]) 03/29/2020   Anxiety    RLQ abdominal pain 10/22/2019   Paroxysmal atrial fibrillation ([place]) 05/22/2019   Iron deficiency anemia 05/07/2019    Atrial fibrillation with RVR ([place]) 10/21/2018   Cerebellar stroke, acute ([place]) 10/21/2018   Streptococcal endocarditis    Endocarditis of mitral valve 10/07/2018   Bacteremia due to Streptococcus [place] 10/07/2018   Sepsis ([place]) 49/67/5916   Acute metabolic encephalopathy 38/46/6599   Severe aortic stenosis 07/08/2018   S/P TAVR (transcatheter aortic valve replacement) 07/08/2018   Esophageal thickening    CAD in native artery 04/22/2018   Gastroesophageal reflux disease    Pulmonary hypertension ([place]) 02/21/2018   Essential hypertension 07/15/2017   History of colonic polyps 05/22/2017   Elevated liver function tests 12/05/2016   DOE (dyspnea on exertion) 07/19/2016   Thalassemia minor 05/29/2016   Left bundle branch block 12/06/2015   Upper airway cough syndrome 35/70/1779   Diastolic heart failure ([place]) 10/10/2015   Myalgia 02/17/2014   Carotid artery stenosis 06/05/2013   Eustachian tube dysfunction 05/07/2013   Neuropathy of leg 03/07/2012   Anemia,  unspecified 01/31/2012   Hot flashes 08/24/2010   Diabetes mellitus due to underlying condition, uncontrolled 06/30/2010   Generalized abdominal pain 03/24/2010   Obesity (BMI 30.0-34.9) 12/21/2009   IBS (irritable bowel syndrome) 04/29/2009   Vitamin D deficiency 03/10/2009   Acute bronchitis 03/07/2009   Hypothyroidism 12/13/2008   Dyslipidemia 12/13/2008   Anxiety state 12/13/2008   Other specified disorders of bladder 12/13/2008    Janene Harvey, PT, DPT 05/26/21 11:04 AM   [facility] [place] [address], [place], [place], [address] Phone: [phone]   Fax:  [phone]  Name: MANDA HOLSTAD MRN: [id number] Date of Birth: 02-29-1946

## 2021-05-26 NOTE — Patient Instructions (Addendum)

## 2021-05-29 ENCOUNTER — Ambulatory Visit: Payer: Medicare Other | Admitting: Physical Therapy

## 2021-05-29 ENCOUNTER — Other Ambulatory Visit: Payer: Self-pay

## 2021-05-29 DIAGNOSIS — R2689 Other abnormalities of gait and mobility: Secondary | ICD-10-CM

## 2021-05-29 DIAGNOSIS — R2681 Unsteadiness on feet: Secondary | ICD-10-CM

## 2021-05-29 DIAGNOSIS — R278 Other lack of coordination: Secondary | ICD-10-CM | POA: Diagnosis not present

## 2021-05-29 DIAGNOSIS — M6281 Muscle weakness (generalized): Secondary | ICD-10-CM

## 2021-05-29 DIAGNOSIS — R29898 Other symptoms and signs involving the musculoskeletal system: Secondary | ICD-10-CM | POA: Diagnosis not present

## 2021-05-29 NOTE — Therapy (Signed)
[facility] [address] [place], [place], [address] Phone: [phone]   Fax:  [phone]  Physical Therapy Treatment  Patient Details  Name: Breanna Webster MRN: [id number] Date of Birth: 01-31-1946 Referring Provider (PT): Dohmeier, Asencion Partridge   Encounter Date: 05/29/2021   PT End of Session - 05/29/21 1854     Visit Number 8    Number of Visits 17    Date for PT Re-Evaluation 06/23/21    Authorization Type Medicare/Generic Commercial    Progress Note Due on Visit 10    PT Start Time 1410    PT Stop Time 1450    PT Time Calculation (min) 40 min    Equipment Utilized During Treatment --   large yellow noodle, small bar bells, blue noodle, aquatic step   Activity Tolerance Patient tolerated treatment well    Behavior During Therapy WFL for tasks assessed/performed             Past Medical History:  Diagnosis Date   Anxiety    Arthritis    "back" (04/22/2018)   Back pain    Blood transfusion without reported diagnosis    CAD (coronary artery disease)    a. 03/2018 s/p PCI/DES to the RCA (3.0x15 Onyx DES).   Carotid artery stenosis    Mild   Chest pain    Chronic lower back pain    Cirrhosis (HCC)    Colon polyps    Diverticulitis    Diverticulosis    Esophageal thickening    seen on pre TAVR CT scan, also questionable cirrhosis. MRI recommended. Will refer to GI   Fatty liver    GERD (gastroesophageal reflux disease)    Grave's disease    History of colonic polyps 05/22/2017   History of hiatal hernia    Hypertension    Hypothyroidism    IBS (irritable bowel syndrome)    Osteopenia    Pulmonary nodules    seen on pre TAVR CT. likley benign. no follow up recommended if pt low risk.   S/P TAVR (transcatheter aortic valve replacement)    Severe aortic stenosis    Shortness of breath on exertion    Stroke ([place])    Thalassemia minor    Thyroid disease    Type II diabetes mellitus ([place])      Past Surgical History:  Procedure Laterality Date   39 HOUR [place] STUDY N/A 03/03/2018   Procedure: 24 HOUR PH STUDY;  Surgeon: Mauri Pole, MD;  Location: [facility];  Service: Endoscopy;  Laterality: N/A;   AORTIC VALVE REPLACEMENT  06/2018   COLONOSCOPY     COLONOSCOPY W/ BIOPSIES AND POLYPECTOMY     CORONARY ANGIOGRAPHY Right 04/21/2018   Procedure: CORONARY ANGIOGRAPHY (CATH LAB);  Surgeon: [name], MD;  Location: [facility];  Service: Cardiovascular;  Laterality: Right;   CORONARY STENT INTERVENTION N/A 04/22/2018   Procedure: CORONARY STENT INTERVENTION;  Surgeon: [name], MD;  Location: [facility];  Service: Cardiovascular;  Laterality: N/A;   DILATION AND CURETTAGE OF UTERUS     ESOPHAGEAL MANOMETRY N/A 03/03/2018   Procedure: ESOPHAGEAL MANOMETRY (EM);  Surgeon: Mauri Pole, MD;  Location: [facility];  Service: Endoscopy;  Laterality: N/A;   GASTRIC FUNDOPLICATION     HERNIA REPAIR     HYSTEROSCOPY     fibroids   LAPAROSCOPIC CHOLECYSTECTOMY     LAPAROSCOPY     fibroids   NISSEN FUNDOPLICATION  7322G  POLYPECTOMY     RIGHT/LEFT HEART CATH AND CORONARY ANGIOGRAPHY N/A 02/20/2018   Procedure: RIGHT/LEFT HEART CATH AND CORONARY ANGIOGRAPHY;  Surgeon: [name], MD;  Location: [facility];  Service: Cardiovascular;  Laterality: N/A;   TEE WITHOUT CARDIOVERSION N/A 07/08/2018   Procedure: TRANSESOPHAGEAL ECHOCARDIOGRAM (TEE);  Surgeon: Burnell Blanks, MD;  Location: [facility];  Service: Open Heart Surgery;  Laterality: N/A;   TEE WITHOUT CARDIOVERSION  10/07/2018   TEE WITHOUT CARDIOVERSION N/A 10/07/2018   Procedure: TRANSESOPHAGEAL ECHOCARDIOGRAM (TEE);  Surgeon: Jerline Pain, MD;  Location: [facility];  Service: Cardiovascular;  Laterality: N/A;   TONSILLECTOMY     TRANSCATHETER AORTIC VALVE REPLACEMENT, TRANSFEMORAL N/A 07/08/2018   Procedure: TRANSCATHETER AORTIC VALVE REPLACEMENT, TRANSFEMORAL;   Surgeon: Burnell Blanks, MD;  Location: [facility];  Service: Open Heart Surgery;  Laterality: N/A;    There were no vitals filed for this visit.   Subjective Assessment - 05/29/21 1851     Subjective Pt arrives 10" late for aquatic therapy appt. - no specific reason given; pt reports her Rt knee really bothered her yesterday    Patient is accompained by: --   husband did not accompany her to the pool area today   Pertinent History significant arthritis in knees    Patient Stated Goals Pt's goals for therapy are to get better with walking and with arthritis.    Currently in Pain? Yes    Pain Score 5     Pain Location Knee    Pain Orientation Right    Pain Descriptors / Indicators Aching;Discomfort;Dull    Pain Type Chronic pain    Pain Onset More than a month ago    Pain Frequency Intermittent                  Aquatic therapy at [facility] [place] 89 degrees  Patient seen for aquatic therapy today.  Treatment took place in water 3.5-4.5 feet deep depending upon activity.  Pt entered & exited the pool  the pool via step negotiation, using a step by step sequence with use of bil. Hand rails with SBA.  Pt performed water walking 18' x 4 reps forwards across width of pool using large yellow noodle for UE support with CGA; 18' x 2 reps with UE support with small bar bells; 18' x 1 rep at end of session with close SBA with no UE support on noodle or barbells  Pt performed sideways ambulation 18' x 4 reps and then backwards amb.  94' X 2 reps with UE support on pool noodle with CGA to min assist  Pt performed marching in place 10 reps with bil. UE support on noodle; marching forwards 18' x 1 rep with UE support on noodle   Pt performed sit to stand transfer from 3rd step from bottom of pool in 3.5' water depth to noodle with SBA - 5 reps with bil. UE support on hand rails - with cues to lean forward and push through legs   Pt performed standing balance  exercises with UE support on pool noodle with CGA - marching in place 10 reps each leg; forward, back and side kicks - alternating legs 5 reps each  Pt performed alternate tap ups to 6" aquatic step in pool in 4.5" water depth- 10 reps each; forward step ups onto step with each LE leading 5 reps each leg  Pt performed SLS activity on each leg - standing near edge  of pool with UE support on side - pt performed circles clockwise and counterclockwise 5 reps each direction each leg   Pt requires buoyancy of water for support and safety with standing balance exercises; exercises are able to be safely attempted/performed in water without or with minimal UE support that cannot be performed safely on land.  Viscosity of water is needed for resistance for strengthening.  Current of water provides perturbations for challenges with standing balance.  Buoyancy of water also provides joint offloading which increases comfort and reduces pain in knees with weight bearing activities and exs in water, whereas, this is unable to be achieved with weight bearing exercises on land.                            PT Short Term Goals - 05/29/21 1909       PT SHORT TERM GOAL #1   Title Pt will be independent with HEP for improved strength and flexibility, balance, transfers, and gait.   TARGET 05/26/2021    Time 4    Period Weeks    Status Achieved      PT SHORT TERM GOAL #2   Title Pt will improve Berg score to at least 41/56 to decrease fall risk.    Baseline 36/56    Time 4    Period Weeks    Status Achieved      PT SHORT TERM GOAL #3   Title Pt will improve TUG score to less than or equal to 21 sec for decreased fall risk.    Baseline 26.88 sec    Time 4    Period Weeks    Status Achieved      PT SHORT TERM GOAL #4   Title Pt will verbalize understanding of fall prevention in home environment.    Time 4    Period Weeks    Status Achieved               PT Long Term Goals -  05/29/21 1910       PT LONG TERM GOAL #1   Title Pt will be independent with HEP for improved flexibility and strength, balance, transfers, and gait.  TARGET 06/23/2021    Time 9    Period Weeks    Status On-going      PT LONG TERM GOAL #2   Title Pt will improve Berg score to at least 46/56 to decrease fall risk.    Time 9    Period Weeks    Status On-going      PT LONG TERM GOAL #3   Title Pt will improve TUG score to less than or equal to 16 sec for decreased fall risk.    Time 9    Period Weeks    Status On-going      PT LONG TERM GOAL #4   Title Pt will improve gait velocity to at least 2 ft/sec for improved gait efficiency and safety.    Baseline 1.73 ft/sec    Time 9    Period Weeks    Status On-going      PT LONG TERM GOAL #5   Title Pt will verbalize plans for continued community fitness upon d/c to maximize gains made in PT.    Time 9    Period Weeks    Status On-going                   Plan -  05/29/21 1857     Clinical Impression Statement Aquatic therapy session focused on balance and gait training in 4' water depth and LE strengthening exercises.  Pt is feeling more comfortable and less anxious/nervous in the pool and was able to amb. 18' across width of pool without UE support with close SBA for 1 rep at end of session.   Pt required intermittent UE support on pool wall with tap ups to aquatic step for assistance with balance.  Sit to stand transfers were performed from 3rd step from bottom of pool with bil. UE support on the rails and cues for leaning forward.  Cont with POC.    Personal Factors and Comorbidities Comorbidity 3+    Comorbidities CVA due to embolism R PCA; COVID -19, post-viral fatigue syndrome  PMH COVID 11/2020, arthritis, back pain, CAD, cirrhosis, diverticulitis/osis, GERD, HTN, IBS, osteopenia, PAF    Examination-Activity Limitations Locomotion Level;Transfers;Stand;Caring for Others;Dressing    Examination-Participation Restrictions  Meal Prep;Cleaning;Community Activity;Laundry    Stability/Clinical Decision Making Evolving/Moderate complexity    Rehab Potential Good    PT Frequency 2x / week    PT Duration 8 weeks   plus eval, so 9 wks totatl including eval week; will likely try for 1x/wk pool and 1x/wk in-clinic   PT Treatment/Interventions ADLs/Self Care Home Management;Aquatic Therapy;DME Instruction;Neuromuscular re-education;Balance training;Therapeutic exercise;Therapeutic activities;Functional mobility training;Gait training;Patient/family education;Manual techniques    PT Next Visit Plan Work on standing balance, gait activities.  Gait training with cane outdoor surfaces; work on timing/coordination with LLE hip/knee flexion with gait.    Consulted and Agree with Plan of Care Patient             Patient will benefit from skilled therapeutic intervention in order to improve the following deficits and impairments:  Abnormal gait, Difficulty walking, Impaired flexibility, Decreased balance, Decreased mobility, Decreased strength, Postural dysfunction, Pain  Visit Diagnosis: Other abnormalities of gait and mobility  Unsteadiness on feet  Muscle weakness (generalized)     Problem List Patient Active Problem List   Diagnosis Date Noted   COVID-19 02/14/2021   Postviral fatigue syndrome 02/14/2021   Difficulty with adaptive servo-ventilation (ASV) use 02/14/2021   Sepsis secondary to UTI ([place]) 09/08/2020   Elevated ALT measurement 09/08/2020   History of CVA with residual deficit 09/08/2020   Hyperbilirubinemia 09/08/2020   Fatigue associated with anemia 08/03/2020   Cerebrovascular accident (CVA) due to embolism of right posterior cerebral artery ([place]) 08/03/2020   OSA treated with BiPAP 08/03/2020   Complex sleep apnea syndrome 08/03/2020   Treatment-emergent central sleep apnea 08/03/2020   Chronic intermittent hypoxia with obstructive sleep apnea 04/21/2020   OSA (obstructive sleep apnea)  04/21/2020   History of cardioembolic stroke 70/96/2836   Gait disturbance, post-stroke 03/29/2020   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 ([place]) 03/29/2020   Anxiety    RLQ abdominal pain 10/22/2019   Paroxysmal atrial fibrillation ([place]) 05/22/2019   Iron deficiency anemia 05/07/2019   Atrial fibrillation with RVR ([place]) 10/21/2018   Cerebellar stroke, acute ([place]) 10/21/2018   Streptococcal endocarditis    Endocarditis of mitral valve 10/07/2018   Bacteremia due to Streptococcus Salivarius 10/07/2018   Sepsis ([place]) 62/94/7654   Acute metabolic encephalopathy 65/06/5463   Severe aortic stenosis 07/08/2018   S/P TAVR (transcatheter aortic valve replacement) 07/08/2018   Esophageal thickening    CAD in native artery 04/22/2018   Gastroesophageal reflux disease    Pulmonary hypertension ([place]) 02/21/2018   Essential hypertension 07/15/2017   History of  colonic polyps 05/22/2017   Elevated liver function tests 12/05/2016   DOE (dyspnea on exertion) 07/19/2016   Thalassemia minor 05/29/2016   Left bundle branch block 12/06/2015   Upper airway cough syndrome 75/30/1040   Diastolic heart failure ([place]) 10/10/2015   Myalgia 02/17/2014   Carotid artery stenosis 06/05/2013   Eustachian tube dysfunction 05/07/2013   Neuropathy of leg 03/07/2012   Anemia, unspecified 01/31/2012   Hot flashes 08/24/2010   Diabetes mellitus due to underlying condition, uncontrolled 06/30/2010   Generalized abdominal pain 03/24/2010   Obesity (BMI 30.0-34.9) 12/21/2009   IBS (irritable bowel syndrome) 04/29/2009   Vitamin D deficiency 03/10/2009   Acute bronchitis 03/07/2009   Hypothyroidism 12/13/2008   Dyslipidemia 12/13/2008   Anxiety state 12/13/2008   Other specified disorders of bladder 12/13/2008    EBVPLW, [id number], PT 05/29/2021, 7:13 PM  [facility] [address] [place], [place], [address] Phone: [phone]    Fax:  [phone]  Name: Breanna Webster MRN: [id number] Date of Birth: 12-Feb-1946

## 2021-05-30 ENCOUNTER — Encounter: Payer: Self-pay | Admitting: Physical Therapy

## 2021-05-30 ENCOUNTER — Ambulatory Visit: Payer: Medicare Other | Admitting: Physical Therapy

## 2021-05-30 DIAGNOSIS — M6281 Muscle weakness (generalized): Secondary | ICD-10-CM | POA: Diagnosis not present

## 2021-05-30 DIAGNOSIS — R29898 Other symptoms and signs involving the musculoskeletal system: Secondary | ICD-10-CM | POA: Diagnosis not present

## 2021-05-30 DIAGNOSIS — R2689 Other abnormalities of gait and mobility: Secondary | ICD-10-CM | POA: Diagnosis not present

## 2021-05-30 DIAGNOSIS — R2681 Unsteadiness on feet: Secondary | ICD-10-CM | POA: Diagnosis not present

## 2021-05-30 DIAGNOSIS — R278 Other lack of coordination: Secondary | ICD-10-CM | POA: Diagnosis not present

## 2021-05-30 NOTE — Therapy (Signed)
[facility] [address], [place], [place], [address] Phone: [phone]   Fax:  [phone]  Physical Therapy Treatment  Patient Details  Name: Breanna Webster MRN: [id number] Date of Birth: 04/17/1946 Referring Provider (PT): Dohmeier, Asencion Partridge   Encounter Date: 05/30/2021   PT End of Session - 05/30/21 1243     Visit Number 9    Number of Visits 17    Date for PT Re-Evaluation 06/23/21    Authorization Type Medicare/Generic Commercial    Progress Note Due on Visit 10    PT Start Time 1108   pt late   PT Stop Time 1146    PT Time Calculation (min) 38 min    Equipment Utilized During Treatment Gait belt   large yellow noodle, small bar bells, blue noodle, aquatic step   Activity Tolerance Patient tolerated treatment well    Behavior During Therapy WFL for tasks assessed/performed             Past Medical History:  Diagnosis Date   Anxiety    Arthritis    "back" (04/22/2018)   Back pain    Blood transfusion without reported diagnosis    CAD (coronary artery disease)    a. 03/2018 s/p PCI/DES to the RCA (3.0x15 Onyx DES).   Carotid artery stenosis    Mild   Chest pain    Chronic lower back pain    Cirrhosis (HCC)    Colon polyps    Diverticulitis    Diverticulosis    Esophageal thickening    seen on pre TAVR CT scan, also questionable cirrhosis. MRI recommended. Will refer to GI   Fatty liver    GERD (gastroesophageal reflux disease)    Grave's disease    History of colonic polyps 05/22/2017   History of hiatal hernia    Hypertension    Hypothyroidism    IBS (irritable bowel syndrome)    Osteopenia    Pulmonary nodules    seen on pre TAVR CT. likley benign. no follow up recommended if pt low risk.   S/P TAVR (transcatheter aortic valve replacement)    Severe aortic stenosis    Shortness of breath on exertion    Stroke ([place])    Thalassemia minor    Thyroid disease    Type II diabetes mellitus ([place])      Past Surgical History:  Procedure Laterality Date   93 HOUR [place] STUDY N/A 03/03/2018   Procedure: 24 HOUR PH STUDY;  Surgeon: Mauri Pole, MD;  Location: [facility];  Service: Endoscopy;  Laterality: N/A;   AORTIC VALVE REPLACEMENT  06/2018   COLONOSCOPY     COLONOSCOPY W/ BIOPSIES AND POLYPECTOMY     CORONARY ANGIOGRAPHY Right 04/21/2018   Procedure: CORONARY ANGIOGRAPHY (CATH LAB);  Surgeon: [name], MD;  Location: [facility];  Service: Cardiovascular;  Laterality: Right;   CORONARY STENT INTERVENTION N/A 04/22/2018   Procedure: CORONARY STENT INTERVENTION;  Surgeon: [name], MD;  Location: [facility];  Service: Cardiovascular;  Laterality: N/A;   DILATION AND CURETTAGE OF UTERUS     ESOPHAGEAL MANOMETRY N/A 03/03/2018   Procedure: ESOPHAGEAL MANOMETRY (EM);  Surgeon: Mauri Pole, MD;  Location: [facility];  Service: Endoscopy;  Laterality: N/A;   GASTRIC FUNDOPLICATION     HERNIA REPAIR     HYSTEROSCOPY     fibroids   LAPAROSCOPIC CHOLECYSTECTOMY     LAPAROSCOPY     fibroids  NISSEN FUNDOPLICATION  5456Y   POLYPECTOMY     RIGHT/LEFT HEART CATH AND CORONARY ANGIOGRAPHY N/A 02/20/2018   Procedure: RIGHT/LEFT HEART CATH AND CORONARY ANGIOGRAPHY;  Surgeon: [name], MD;  Location: [facility];  Service: Cardiovascular;  Laterality: N/A;   TEE WITHOUT CARDIOVERSION N/A 07/08/2018   Procedure: TRANSESOPHAGEAL ECHOCARDIOGRAM (TEE);  Surgeon: Burnell Blanks, MD;  Location: [facility];  Service: Open Heart Surgery;  Laterality: N/A;   TEE WITHOUT CARDIOVERSION  10/07/2018   TEE WITHOUT CARDIOVERSION N/A 10/07/2018   Procedure: TRANSESOPHAGEAL ECHOCARDIOGRAM (TEE);  Surgeon: Jerline Pain, MD;  Location: [facility];  Service: Cardiovascular;  Laterality: N/A;   TONSILLECTOMY     TRANSCATHETER AORTIC VALVE REPLACEMENT, TRANSFEMORAL N/A 07/08/2018   Procedure: TRANSCATHETER AORTIC VALVE REPLACEMENT, TRANSFEMORAL;   Surgeon: Burnell Blanks, MD;  Location: [facility];  Service: Open Heart Surgery;  Laterality: N/A;    There were no vitals filed for this visit.   Subjective Assessment - 05/30/21 1109     Subjective Fatigued after aquatic therapy last time. Denies quesitons on updated HEP.    Pertinent History significant arthritis in knees    Patient Stated Goals Pt's goals for therapy are to get better with walking and with arthritis.    Currently in Pain? No/denies                               [facility] Adult PT Treatment/Exercise - 05/30/21 0001       Ambulation/Gait   Ambulation/Gait Assistance 4: Min guard    Ambulation Distance (Feet) 200 Feet    Assistive device None;Straight cane   quad tip cane   Gait Pattern Step-through pattern;Decreased step length - left;Decreased stance time - right;Decreased dorsiflexion - left;Decreased hip/knee flexion - right;Decreased hip/knee flexion - left;Poor foot clearance - left;Narrow base of support    Ambulation Surface Level;Unlevel;Indoor;Outdoor;Paved    Gait Comments gait training with verbal and manual cues for proper cane sequencing and encouraging increased R step length for step through pattern; intermittnet breaks taken to reset      Neuro Re-ed    Neuro Re-ed Details  alt toe tap on 6" step 3x10   cues to slow down and promote wt shift     Knee/Hip Exercises: Standing   Forward Step Up Right;Left;1 set;10 reps;Hand Hold: 1;Step Height: 6"    Forward Step Up Limitations stability improved with subseuent reps                 Balance Exercises - 05/30/21 0001       Balance Exercises: Standing   Standing Eyes Opened Narrow base of support (BOS);Limitations;Foam/compliant surface;2 reps;30 secs    Standing Eyes Closed Narrow base of support (BOS);Foam/compliant surface;2 reps;30 secs    Standing Eyes Closed Limitations intermittent light UE support                PT Education - 05/30/21 1243      Education Details edu on most appropriate cane for ambulation to purchase; edu on benefits of cane    Person(s) Educated Patient    Methods Explanation;Handout    Comprehension Verbalized understanding              PT Short Term Goals - 05/29/21 1909       PT SHORT TERM GOAL #1   Title Pt will be independent with HEP for improved strength and flexibility, balance, transfers, and gait.  TARGET 05/26/2021    Time 4    Period Weeks    Status Achieved      PT SHORT TERM GOAL #2   Title Pt will improve Berg score to at least 41/56 to decrease fall risk.    Baseline 36/56    Time 4    Period Weeks    Status Achieved      PT SHORT TERM GOAL #3   Title Pt will improve TUG score to less than or equal to 21 sec for decreased fall risk.    Baseline 26.88 sec    Time 4    Period Weeks    Status Achieved      PT SHORT TERM GOAL #4   Title Pt will verbalize understanding of fall prevention in home environment.    Time 4    Period Weeks    Status Achieved               PT Long Term Goals - 05/29/21 1910       PT LONG TERM GOAL #1   Title Pt will be independent with HEP for improved flexibility and strength, balance, transfers, and gait.  TARGET 06/23/2021    Time 9    Period Weeks    Status On-going      PT LONG TERM GOAL #2   Title Pt will improve Berg score to at least 46/56 to decrease fall risk.    Time 9    Period Weeks    Status On-going      PT LONG TERM GOAL #3   Title Pt will improve TUG score to less than or equal to 16 sec for decreased fall risk.    Time 9    Period Weeks    Status On-going      PT LONG TERM GOAL #4   Title Pt will improve gait velocity to at least 2 ft/sec for improved gait efficiency and safety.    Baseline 1.73 ft/sec    Time 9    Period Weeks    Status On-going      PT LONG TERM GOAL #5   Title Pt will verbalize plans for continued community fitness upon d/c to maximize gains made in PT.    Time 9    Period Weeks    Status  On-going                   Plan - 05/30/21 1244     Clinical Impression Statement Patient arrived to session without new complaints. Static balance on foam and with/without visual fixation was performed with some apprehension with EC d/t mild sway. Able to perform SLS activities with improved control after verbal and tactile cueing to decrease speed and improve lateral wt shift. Stability with step ups improved with subsequent reps d/t improved comfort with the activity. Worked on gait training with quad tip cane indoors/outdoors with cueing for cane sequencing and step through pattern. Patient with improved gait pattern after practice; still requires intermittent breaks to reset. Provided into on cane for purchase. Patient reported understanding and without complaints at end of session.    Personal Factors and Comorbidities Comorbidity 3+    Comorbidities CVA due to embolism R PCA; COVID -19, post-viral fatigue syndrome  PMH COVID 11/2020, arthritis, back pain, CAD, cirrhosis, diverticulitis/osis, GERD, HTN, IBS, osteopenia, PAF    Examination-Activity Limitations Locomotion Level;Transfers;Stand;Caring for Others;Dressing    Examination-Participation Restrictions Meal Prep;Cleaning;Community Activity;Laundry    Stability/Clinical Decision Making Evolving/Moderate  complexity    Rehab Potential Good    PT Frequency 2x / week    PT Duration 8 weeks   plus eval, so 9 wks totatl including eval week; will likely try for 1x/wk pool and 1x/wk in-clinic   PT Treatment/Interventions ADLs/Self Care Home Management;Aquatic Therapy;DME Instruction;Neuromuscular re-education;Balance training;Therapeutic exercise;Therapeutic activities;Functional mobility training;Gait training;Patient/family education;Manual techniques    PT Next Visit Plan Work on standing balance, gait activities.  Gait training with cane outdoor surfaces; work on timing/coordination with LLE hip/knee flexion with gait.    Consulted  and Agree with Plan of Care Patient             Patient will benefit from skilled therapeutic intervention in order to improve the following deficits and impairments:  Abnormal gait, Difficulty walking, Impaired flexibility, Decreased balance, Decreased mobility, Decreased strength, Postural dysfunction, Pain  Visit Diagnosis: Other abnormalities of gait and mobility  Unsteadiness on feet  Muscle weakness (generalized)  Other symptoms and signs involving the musculoskeletal system     Problem List Patient Active Problem List   Diagnosis Date Noted   COVID-19 02/14/2021   Postviral fatigue syndrome 02/14/2021   Difficulty with adaptive servo-ventilation (ASV) use 02/14/2021   Sepsis secondary to UTI ([place]) 09/08/2020   Elevated ALT measurement 09/08/2020   History of CVA with residual deficit 09/08/2020   Hyperbilirubinemia 09/08/2020   Fatigue associated with anemia 08/03/2020   Cerebrovascular accident (CVA) due to embolism of right posterior cerebral artery ([place]) 08/03/2020   OSA treated with BiPAP 08/03/2020   Complex sleep apnea syndrome 08/03/2020   Treatment-emergent central sleep apnea 08/03/2020   Chronic intermittent hypoxia with obstructive sleep apnea 04/21/2020   OSA (obstructive sleep apnea) 04/21/2020   History of cardioembolic stroke 38/46/6599   Gait disturbance, post-stroke 03/29/2020   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 ([place]) 03/29/2020   Anxiety    RLQ abdominal pain 10/22/2019   Paroxysmal atrial fibrillation ([place]) 05/22/2019   Iron deficiency anemia 05/07/2019   Atrial fibrillation with RVR ([place]) 10/21/2018   Cerebellar stroke, acute ([place]) 10/21/2018   Streptococcal endocarditis    Endocarditis of mitral valve 10/07/2018   Bacteremia due to Streptococcus Salivarius 10/07/2018   Sepsis ([place]) 35/70/1779   Acute metabolic encephalopathy 39/06/90   Severe aortic stenosis 07/08/2018   S/P TAVR (transcatheter aortic valve  replacement) 07/08/2018   Esophageal thickening    CAD in native artery 04/22/2018   Gastroesophageal reflux disease    Pulmonary hypertension ([place]) 02/21/2018   Essential hypertension 07/15/2017   History of colonic polyps 05/22/2017   Elevated liver function tests 12/05/2016   DOE (dyspnea on exertion) 07/19/2016   Thalassemia minor 05/29/2016   Left bundle branch block 12/06/2015   Upper airway cough syndrome 33/00/7622   Diastolic heart failure ([place]) 10/10/2015   Myalgia 02/17/2014   Carotid artery stenosis 06/05/2013   Eustachian tube dysfunction 05/07/2013   Neuropathy of leg 03/07/2012   Anemia, unspecified 01/31/2012   Hot flashes 08/24/2010   Diabetes mellitus due to underlying condition, uncontrolled 06/30/2010   Generalized abdominal pain 03/24/2010   Obesity (BMI 30.0-34.9) 12/21/2009   IBS (irritable bowel syndrome) 04/29/2009   Vitamin D deficiency 03/10/2009   Acute bronchitis 03/07/2009   Hypothyroidism 12/13/2008   Dyslipidemia 12/13/2008   Anxiety state 12/13/2008   Other specified disorders of bladder 12/13/2008    Janene Harvey, PT, DPT 05/30/21 12:47 PM   [facility] [address], [place], [place], [address] Phone:  [phone]   Fax:  [phone]  Name: Breanna Webster MRN: [id number] Date of Birth: 04/06/1946

## 2021-06-05 ENCOUNTER — Ambulatory Visit: Payer: Medicare Other | Admitting: Physical Therapy

## 2021-06-05 ENCOUNTER — Encounter: Payer: Self-pay | Admitting: Physical Therapy

## 2021-06-05 ENCOUNTER — Other Ambulatory Visit: Payer: Self-pay

## 2021-06-05 DIAGNOSIS — R278 Other lack of coordination: Secondary | ICD-10-CM | POA: Diagnosis not present

## 2021-06-05 DIAGNOSIS — R2689 Other abnormalities of gait and mobility: Secondary | ICD-10-CM

## 2021-06-05 DIAGNOSIS — M6281 Muscle weakness (generalized): Secondary | ICD-10-CM | POA: Diagnosis not present

## 2021-06-05 DIAGNOSIS — R2681 Unsteadiness on feet: Secondary | ICD-10-CM | POA: Diagnosis not present

## 2021-06-05 DIAGNOSIS — R29898 Other symptoms and signs involving the musculoskeletal system: Secondary | ICD-10-CM | POA: Diagnosis not present

## 2021-06-05 NOTE — Therapy (Signed)
[facility] [address] [place], [place], [address] Phone: [phone]   Fax:  [phone]  Physical Therapy Treatment & 10th Visit Progress Note  Reporting Period:  04-26-21 - 06-05-21 See below for objective data and progress towards STG's  Patient Details  Name: Breanna Webster MRN: [id number] Date of Birth: 1945/09/17 Referring Provider (PT): Dohmeier, Asencion Partridge   Encounter Date: 06/05/2021   PT End of Session - 06/05/21 1953     Visit Number 10    Number of Visits 17    Date for PT Re-Evaluation 06/23/21    Authorization Type Medicare/Generic Commercial    Progress Note Due on Visit 10    PT Start Time 1410   arrived late for session   PT Stop Time 1445    PT Time Calculation (min) 35 min    Equipment Utilized During Treatment Other (comment)   large yellow noodle, small bar bells, aquatic step   Activity Tolerance Patient tolerated treatment well    Behavior During Therapy WFL for tasks assessed/performed             Past Medical History:  Diagnosis Date   Anxiety    Arthritis    "back" (04/22/2018)   Back pain    Blood transfusion without reported diagnosis    CAD (coronary artery disease)    a. 03/2018 s/p PCI/DES to the RCA (3.0x15 Onyx DES).   Carotid artery stenosis    Mild   Chest pain    Chronic lower back pain    Cirrhosis (HCC)    Colon polyps    Diverticulitis    Diverticulosis    Esophageal thickening    seen on pre TAVR CT scan, also questionable cirrhosis. MRI recommended. Will refer to GI   Fatty liver    GERD (gastroesophageal reflux disease)    Grave's disease    History of colonic polyps 05/22/2017   History of hiatal hernia    Hypertension    Hypothyroidism    IBS (irritable bowel syndrome)    Osteopenia    Pulmonary nodules    seen on pre TAVR CT. likley benign. no follow up recommended if pt low risk.   S/P TAVR (transcatheter aortic valve replacement)    Severe aortic  stenosis    Shortness of breath on exertion    Stroke ([place])    Thalassemia minor    Thyroid disease    Type II diabetes mellitus ([place])     Past Surgical History:  Procedure Laterality Date   4 HOUR [place] STUDY N/A 03/03/2018   Procedure: 24 HOUR PH STUDY;  Surgeon: Mauri Pole, MD;  Location: [facility];  Service: Endoscopy;  Laterality: N/A;   AORTIC VALVE REPLACEMENT  06/2018   COLONOSCOPY     COLONOSCOPY W/ BIOPSIES AND POLYPECTOMY     CORONARY ANGIOGRAPHY Right 04/21/2018   Procedure: CORONARY ANGIOGRAPHY (CATH LAB);  Surgeon: [name], MD;  Location: [facility];  Service: Cardiovascular;  Laterality: Right;   CORONARY STENT INTERVENTION N/A 04/22/2018   Procedure: CORONARY STENT INTERVENTION;  Surgeon: [name], MD;  Location: [facility];  Service: Cardiovascular;  Laterality: N/A;   DILATION AND CURETTAGE OF UTERUS     ESOPHAGEAL MANOMETRY N/A 03/03/2018   Procedure: ESOPHAGEAL MANOMETRY (EM);  Surgeon: Mauri Pole, MD;  Location: [facility];  Service: Endoscopy;  Laterality: N/A;   GASTRIC FUNDOPLICATION     HERNIA REPAIR     HYSTEROSCOPY  fibroids   LAPAROSCOPIC CHOLECYSTECTOMY     LAPAROSCOPY     fibroids   NISSEN FUNDOPLICATION  9147W   POLYPECTOMY     RIGHT/LEFT HEART CATH AND CORONARY ANGIOGRAPHY N/A 02/20/2018   Procedure: RIGHT/LEFT HEART CATH AND CORONARY ANGIOGRAPHY;  Surgeon: [name], MD;  Location: [facility];  Service: Cardiovascular;  Laterality: N/A;   TEE WITHOUT CARDIOVERSION N/A 07/08/2018   Procedure: TRANSESOPHAGEAL ECHOCARDIOGRAM (TEE);  Surgeon: Burnell Blanks, MD;  Location: [facility];  Service: Open Heart Surgery;  Laterality: N/A;   TEE WITHOUT CARDIOVERSION  10/07/2018   TEE WITHOUT CARDIOVERSION N/A 10/07/2018   Procedure: TRANSESOPHAGEAL ECHOCARDIOGRAM (TEE);  Surgeon: Jerline Pain, MD;  Location: [facility] END[facility];  Service: Cardiovascular;  Laterality: N/A;   TONSILLECTOMY      TRANSCATHETER AORTIC VALVE REPLACEMENT, TRANSFEMORAL N/A 07/08/2018   Procedure: TRANSCATHETER AORTIC VALVE REPLACEMENT, TRANSFEMORAL;  Surgeon: Burnell Blanks, MD;  Location: [facility];  Service: Open Heart Surgery;  Laterality: N/A;    There were no vitals filed for this visit.   Subjective Assessment - 06/05/21 1949     Subjective Pt arrives 7" late for aquatic PT session;  reports she is "very stiff" today.  Pt also states she purchased cane online (has not started using it yet) but plans to take it to PT tomorrow    Pertinent History significant arthritis in knees    Patient Stated Goals Pt's goals for therapy are to get better with walking and with arthritis.    Currently in Pain? No/denies   reports stiffness              Aquatic therapy at [facility] - pool temp 90 degrees  Patient seen for aquatic therapy today.  Treatment took place in water 3.5-4.5 feet deep depending upon activity.  Pt entered & exited the pool  the pool via step negotiation, using a step by step sequence with use of bil. Hand rails with supervision.  Pt performed water walking 18' x 4 reps -  forwards across width of pool using large yellow noodle for UE support with SBA for 2 reps and then with barbells 18' x 2 reps:  18' x 1 rep backwards with UE support on pool noodle  Pt performed sideways ambulation 18' x 2 reps with UE support on pool noodle  Pt performed marching in place 10 reps with bil. UE support on noodle; Marching in place 10 reps with no UE support but with close SBA due to pt's anxiety;  pt performed marching forwards 18' x 1 rep with UE support on noodle and with CGA  Pt performed standing balance exercises with UE support on pool noodle with CGA - forward, back and side kicks - 5 reps each leg due to time constraint  Pt performed alternate tap ups to 6" aquatic step in pool in 4.5" water depth- 10 reps each; forward step ups onto step with each LE leading 5 reps each  leg; stepped up onto step and then down to pool floor - turned around with UE support on pool edge and performed again - 3 reps total  Pt performed SLS activity on each leg - standing near edge of pool with UE support on side - performed stepping strategy forward/back 5 reps each leg and then out/in 5 reps each leg with UE support on noodle -with SBA  Pt requires buoyancy of water for support and safety with standing balance exercises; exercises are able to be  safely attempted/performed in water without or with minimal UE support that cannot be performed safely on land.  Viscosity of water is needed for resistance for strengthening.  Current of water provides perturbations for challenges with standing balance.  Buoyancy of water also provides joint offloading which increases comfort and reduces pain in knees with weight bearing activities and exs in water, whereas, this is unable to be achieved with weight bearing exercises on land.                                 PT Short Term Goals - 06/05/21 1954       PT SHORT TERM GOAL #1   Title Pt will be independent with HEP for improved strength and flexibility, balance, transfers, and gait.   TARGET 05/26/2021    Time 4    Period Weeks    Status Achieved      PT SHORT TERM GOAL #2   Title Pt will improve Berg score to at least 41/56 to decrease fall risk.    Baseline 36/56    Time 4    Period Weeks    Status Achieved      PT SHORT TERM GOAL #3   Title Pt will improve TUG score to less than or equal to 21 sec for decreased fall risk.    Baseline 26.88 sec    Time 4    Period Weeks    Status Achieved      PT SHORT TERM GOAL #4   Title Pt will verbalize understanding of fall prevention in home environment.    Time 4    Period Weeks    Status Achieved               PT Long Term Goals - 06/05/21 1955       PT LONG TERM GOAL #1   Title Pt will be independent with HEP for improved flexibility and strength,  balance, transfers, and gait.  TARGET 06/23/2021    Time 9    Period Weeks    Status On-going      PT LONG TERM GOAL #2   Title Pt will improve Berg score to at least 46/56 to decrease fall risk.    Time 9    Period Weeks    Status On-going      PT LONG TERM GOAL #3   Title Pt will improve TUG score to less than or equal to 16 sec for decreased fall risk.    Time 9    Period Weeks    Status On-going      PT LONG TERM GOAL #4   Title Pt will improve gait velocity to at least 2 ft/sec for improved gait efficiency and safety.    Baseline 1.73 ft/sec    Time 9    Period Weeks    Status On-going      PT LONG TERM GOAL #5   Title Pt will verbalize plans for continued community fitness upon d/c to maximize gains made in PT.    Time 9    Period Weeks    Status On-going                   Plan - 06/05/21 1955     Clinical Impression Statement This 10th visit progress note covers dates 04-26-21 - 06-05-21.  Aquatic therapy session focused on gait and balance training in 4' water depth.  Pt demonstrating slow, steady improvements in balance and gait training in the pool, with pt amb. 18' across width of pool without UE support on noodle or barbell.  Cues given to increase reciprocal arm swing and step length and to place heels down first for increased initial heel contact in stance.  Pt was amb. with forefoot first in stance phase with gait training in 3.6" water depth.  Cont with POC.    Personal Factors and Comorbidities Comorbidity 3+    Comorbidities CVA due to embolism R PCA; COVID -19, post-viral fatigue syndrome  PMH COVID 11/2020, arthritis, back pain, CAD, cirrhosis, diverticulitis/osis, GERD, HTN, IBS, osteopenia, PAF    Examination-Activity Limitations Locomotion Level;Transfers;Stand;Caring for Others;Dressing    Examination-Participation Restrictions Meal Prep;Cleaning;Community Activity;Laundry    Stability/Clinical Decision Making Evolving/Moderate complexity    Rehab  Potential Good    PT Frequency 2x / week    PT Duration 8 weeks   plus eval, so 9 wks totatl including eval week; will likely try for 1x/wk pool and 1x/wk in-clinic   PT Treatment/Interventions ADLs/Self Care Home Management;Aquatic Therapy;DME Instruction;Neuromuscular re-education;Balance training;Therapeutic exercise;Therapeutic activities;Functional mobility training;Gait training;Patient/family education;Manual techniques    PT Next Visit Plan Work on standing balance, gait activities.  Gait training with cane outdoor surfaces; work on timing/coordination with LLE hip/knee flexion with gait.    Consulted and Agree with Plan of Care Patient             Patient will benefit from skilled therapeutic intervention in order to improve the following deficits and impairments:  Abnormal gait, Difficulty walking, Impaired flexibility, Decreased balance, Decreased mobility, Decreased strength, Postural dysfunction, Pain  Visit Diagnosis: Other abnormalities of gait and mobility  Unsteadiness on feet     Problem List Patient Active Problem List   Diagnosis Date Noted   COVID-19 02/14/2021   Postviral fatigue syndrome 02/14/2021   Difficulty with adaptive servo-ventilation (ASV) use 02/14/2021   Sepsis secondary to UTI ([place]) 09/08/2020   Elevated ALT measurement 09/08/2020   History of CVA with residual deficit 09/08/2020   Hyperbilirubinemia 09/08/2020   Fatigue associated with anemia 08/03/2020   Cerebrovascular accident (CVA) due to embolism of right posterior cerebral artery ([place]) 08/03/2020   OSA treated with BiPAP 08/03/2020   Complex sleep apnea syndrome 08/03/2020   Treatment-emergent central sleep apnea 08/03/2020   Chronic intermittent hypoxia with obstructive sleep apnea 04/21/2020   OSA (obstructive sleep apnea) 04/21/2020   History of cardioembolic stroke 76/73/4193   Gait disturbance, post-stroke 03/29/2020   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 ([place])  03/29/2020   Anxiety    RLQ abdominal pain 10/22/2019   Paroxysmal atrial fibrillation ([place]) 05/22/2019   Iron deficiency anemia 05/07/2019   Atrial fibrillation with RVR ([place]) 10/21/2018   Cerebellar stroke, acute ([place]) 10/21/2018   Streptococcal endocarditis    Endocarditis of mitral valve 10/07/2018   Bacteremia due to Streptococcus Salivarius 10/07/2018   Sepsis ([place]) 79/05/4095   Acute metabolic encephalopathy 35/32/9924   Severe aortic stenosis 07/08/2018   S/P TAVR (transcatheter aortic valve replacement) 07/08/2018   Esophageal thickening    CAD in native artery 04/22/2018   Gastroesophageal reflux disease    Pulmonary hypertension ([place]) 02/21/2018   Essential hypertension 07/15/2017   History of colonic polyps 05/22/2017   Elevated liver function tests 12/05/2016   DOE (dyspnea on exertion) 07/19/2016   Thalassemia minor 05/29/2016   Left bundle branch block 12/06/2015   Upper airway cough syndrome 26/83/4196   Diastolic heart failure ([place])  10/10/2015   Myalgia 02/17/2014   Carotid artery stenosis 06/05/2013   Eustachian tube dysfunction 05/07/2013   Neuropathy of leg 03/07/2012   Anemia, unspecified 01/31/2012   Hot flashes 08/24/2010   Diabetes mellitus due to underlying condition, uncontrolled 06/30/2010   Generalized abdominal pain 03/24/2010   Obesity (BMI 30.0-34.9) 12/21/2009   IBS (irritable bowel syndrome) 04/29/2009   Vitamin D deficiency 03/10/2009   Acute bronchitis 03/07/2009   Hypothyroidism 12/13/2008   Dyslipidemia 12/13/2008   Anxiety state 12/13/2008   Other specified disorders of bladder 12/13/2008    Alda Lea, PT 06/05/2021, 8:03 PM  [facility] [address] [place], [place], [address] Phone: [phone]   Fax:  [phone]  Name: Breanna Webster MRN: [id number] Date of Birth: 03-Aug-1945

## 2021-06-06 ENCOUNTER — Encounter: Payer: Self-pay | Admitting: Physical Therapy

## 2021-06-06 ENCOUNTER — Telehealth: Payer: Self-pay | Admitting: Neurology

## 2021-06-06 ENCOUNTER — Encounter: Payer: Self-pay | Admitting: Neurology

## 2021-06-06 ENCOUNTER — Ambulatory Visit: Payer: Medicare Other | Admitting: Physical Therapy

## 2021-06-06 ENCOUNTER — Other Ambulatory Visit: Payer: Self-pay | Admitting: Adult Health

## 2021-06-06 DIAGNOSIS — R29898 Other symptoms and signs involving the musculoskeletal system: Secondary | ICD-10-CM | POA: Diagnosis not present

## 2021-06-06 DIAGNOSIS — R278 Other lack of coordination: Secondary | ICD-10-CM | POA: Diagnosis not present

## 2021-06-06 DIAGNOSIS — M6281 Muscle weakness (generalized): Secondary | ICD-10-CM | POA: Diagnosis not present

## 2021-06-06 DIAGNOSIS — Z760 Encounter for issue of repeat prescription: Secondary | ICD-10-CM

## 2021-06-06 DIAGNOSIS — R2681 Unsteadiness on feet: Secondary | ICD-10-CM

## 2021-06-06 DIAGNOSIS — R2689 Other abnormalities of gait and mobility: Secondary | ICD-10-CM | POA: Diagnosis not present

## 2021-06-06 NOTE — Telephone Encounter (Signed)
Patient need to schedule an ov for more refills. 

## 2021-06-06 NOTE — Therapy (Signed)
[facility] [address], [place], [place], [address] Phone: [phone]   Fax:  [phone]  Physical Therapy Treatment  Patient Details  Name: Breanna Webster MRN: [id number] Date of Birth: 1945/12/04 Referring Provider (PT): Dohmeier, Asencion Partridge   Encounter Date: 06/06/2021   PT End of Session - 06/06/21 1111     Visit Number 11    Number of Visits 17    Date for PT Re-Evaluation 06/23/21    Authorization Type Medicare/Generic Commercial    Progress Note Due on Visit 20    PT Start Time 1018    PT Stop Time 1059    PT Time Calculation (min) 41 min    Equipment Utilized During Treatment Gait belt   large yellow noodle, small bar bells, aquatic step   Activity Tolerance Patient tolerated treatment well    Behavior During Therapy WFL for tasks assessed/performed             Past Medical History:  Diagnosis Date   Anxiety    Arthritis    "back" (04/22/2018)   Back pain    Blood transfusion without reported diagnosis    CAD (coronary artery disease)    a. 03/2018 s/p PCI/DES to the RCA (3.0x15 Onyx DES).   Carotid artery stenosis    Mild   Chest pain    Chronic lower back pain    Cirrhosis (HCC)    Colon polyps    Diverticulitis    Diverticulosis    Esophageal thickening    seen on pre TAVR CT scan, also questionable cirrhosis. MRI recommended. Will refer to GI   Fatty liver    GERD (gastroesophageal reflux disease)    Grave's disease    History of colonic polyps 05/22/2017   History of hiatal hernia    Hypertension    Hypothyroidism    IBS (irritable bowel syndrome)    Osteopenia    Pulmonary nodules    seen on pre TAVR CT. likley benign. no follow up recommended if pt low risk.   S/P TAVR (transcatheter aortic valve replacement)    Severe aortic stenosis    Shortness of breath on exertion    Stroke ([place])    Thalassemia minor    Thyroid disease    Type II diabetes mellitus ([place])     Past Surgical  History:  Procedure Laterality Date   57 HOUR [place] STUDY N/A 03/03/2018   Procedure: 24 HOUR PH STUDY;  Surgeon: Mauri Pole, MD;  Location: [facility];  Service: Endoscopy;  Laterality: N/A;   AORTIC VALVE REPLACEMENT  06/2018   COLONOSCOPY     COLONOSCOPY W/ BIOPSIES AND POLYPECTOMY     CORONARY ANGIOGRAPHY Right 04/21/2018   Procedure: CORONARY ANGIOGRAPHY (CATH LAB);  Surgeon: [name], MD;  Location: [facility];  Service: Cardiovascular;  Laterality: Right;   CORONARY STENT INTERVENTION N/A 04/22/2018   Procedure: CORONARY STENT INTERVENTION;  Surgeon: [name], MD;  Location: [facility];  Service: Cardiovascular;  Laterality: N/A;   DILATION AND CURETTAGE OF UTERUS     ESOPHAGEAL MANOMETRY N/A 03/03/2018   Procedure: ESOPHAGEAL MANOMETRY (EM);  Surgeon: Mauri Pole, MD;  Location: [facility];  Service: Endoscopy;  Laterality: N/A;   GASTRIC FUNDOPLICATION     HERNIA REPAIR     HYSTEROSCOPY     fibroids   LAPAROSCOPIC CHOLECYSTECTOMY     LAPAROSCOPY     fibroids   NISSEN FUNDOPLICATION  1990s   POLYPECTOMY     RIGHT/LEFT HEART CATH AND CORONARY ANGIOGRAPHY N/A 02/20/2018   Procedure: RIGHT/LEFT HEART CATH AND CORONARY ANGIOGRAPHY;  Surgeon: [name], MD;  Location: [facility];  Service: Cardiovascular;  Laterality: N/A;   TEE WITHOUT CARDIOVERSION N/A 07/08/2018   Procedure: TRANSESOPHAGEAL ECHOCARDIOGRAM (TEE);  Surgeon: Burnell Blanks, MD;  Location: [facility];  Service: Open Heart Surgery;  Laterality: N/A;   TEE WITHOUT CARDIOVERSION  10/07/2018   TEE WITHOUT CARDIOVERSION N/A 10/07/2018   Procedure: TRANSESOPHAGEAL ECHOCARDIOGRAM (TEE);  Surgeon: Jerline Pain, MD;  Location: [facility];  Service: Cardiovascular;  Laterality: N/A;   TONSILLECTOMY     TRANSCATHETER AORTIC VALVE REPLACEMENT, TRANSFEMORAL N/A 07/08/2018   Procedure: TRANSCATHETER AORTIC VALVE REPLACEMENT, TRANSFEMORAL;  Surgeon: Burnell Blanks, MD;  Location: [facility];  Service: Open Heart Surgery;  Laterality: N/A;    There were no vitals filed for this visit.   Subjective Assessment - 06/06/21 1022     Subjective Got her cane in the mail but still doesn't feel comfortable with it.    Pertinent History significant arthritis in knees    Patient Stated Goals Pt's goals for therapy are to get better with walking and with arthritis.    Currently in Pain? No/denies                [facility] PT Assessment - 06/06/21 0001       Standardized Balance Assessment   Standardized Balance Assessment 10 meter walk test    10 Meter Walk 12.19 sec   2.69 ft/s     Berg Balance Test   Sit to Stand Able to stand without using hands and stabilize independently    Standing Unsupported Able to stand safely 2 minutes    Sitting with Back Unsupported but Feet Supported on Floor or Stool Able to sit safely and securely 2 minutes    Stand to Sit Controls descent by using hands    Transfers Able to transfer safely, minor use of hands    Standing Unsupported with Eyes Closed Able to stand 10 seconds safely    Standing Unsupported with Feet Together Able to place feet together independently and stand for 1 minute with supervision    From Standing, Reach Forward with Outstretched Arm Can reach forward >12 cm safely (5")    From Standing Position, Pick up Object from Floor Able to pick up shoe, needs supervision    From Standing Position, Turn to Look Behind Over each Shoulder Looks behind from both sides and weight shifts well    Turn 360 Degrees Able to turn 360 degrees safely but slowly    Standing Unsupported, Alternately Place Feet on Step/Stool Able to stand independently and complete 8 steps >20 seconds    Standing Unsupported, One Foot in [employer] balance while stepping or standing    Standing on One Leg Able to lift leg independently and hold 5-10 seconds    Total Score 44      Timed Up and Go Test   TUG Normal TUG     Normal TUG (seconds) 16.31   without AD                          [facility] Adult PT Treatment/Exercise - 06/06/21 0001       Ambulation/Gait   Ambulation/Gait Assistance 5: Supervision;4: Min guard    Ambulation Distance (Feet) 120 Feet    Assistive device --  tricane   Gait Pattern Step-through pattern;Decreased step length - left;Decreased stance time - right;Decreased dorsiflexion - left;Decreased hip/knee flexion - right;Decreased hip/knee flexion - left;Poor foot clearance - left;Narrow base of support    Ambulation Surface Level;Indoor    Gait Comments gait training with patients tricane to improve consistency with cane sequencing; verbal and tactile cues      Knee/Hip Exercises: Aerobic   Nustep L4 x 4 min , 4 extremities, keeping pace >60 steps/min                     PT Education - 06/06/21 1109     Education Details discussion on objective progress and remaining impairments; edu on fitness activities to continue after PT; edu on practicing ambulation with cane indoors first    Person(s) Educated Patient    Methods Explanation;Demonstration;Tactile cues;Verbal cues    Comprehension Verbalized understanding;Returned demonstration              PT Short Term Goals - 06/06/21 1043       PT SHORT TERM GOAL #1   Title Pt will be independent with HEP for improved strength and flexibility, balance, transfers, and gait.   TARGET 05/26/2021    Time 4    Period Weeks    Status Achieved      PT SHORT TERM GOAL #2   Title Pt will improve Berg score to at least 41/56 to decrease fall risk.    Baseline 36/56    Time 4    Period Weeks    Status Achieved      PT SHORT TERM GOAL #3   Title Pt will improve TUG score to less than or equal to 21 sec for decreased fall risk.    Baseline 26.88 sec    Time 4    Period Weeks    Status Achieved      PT SHORT TERM GOAL #4   Title Pt will verbalize understanding of fall prevention in home environment.     Time 4    Period Weeks    Status Achieved               PT Long Term Goals - 06/06/21 1043       PT LONG TERM GOAL #1   Title Pt will be independent with HEP for improved flexibility and strength, balance, transfers, and gait.  TARGET 06/23/2021    Time 9    Period Weeks    Status Partially Met   met for current     PT LONG TERM GOAL #2   Title Pt will improve Berg score to at least 46/56 to decrease fall risk.    Time 9    Period Weeks    Status On-going   44/56     PT LONG TERM GOAL #3   Title Pt will improve TUG score to less than or equal to 16 sec for decreased fall risk.    Time 9    Period Weeks    Status Partially Met   16.31 sec     PT LONG TERM GOAL #4   Title Pt will improve gait velocity to at least 2 ft/sec for improved gait efficiency and safety.    Baseline 1.73 ft/sec    Time 9    Period Weeks    Status Achieved   2.69 ft/s     PT LONG TERM GOAL #5   Title Pt will verbalize plans for continued community fitness upon d/c  to maximize gains made in PT.    Time 9    Period Weeks    Status Achieved   reports plans to join gym and walk at [employer] - 06/06/21 1111     Clinical Impression Statement Patient arrived to session ambulating with tricane. Patient scored 44/56 on Berg today, indicating an increased risk of falls but improved forom last session. Able to complete TUG in 16.31 sec today, improved from last session. Gait speed during 62mwalk test improved to 2.69 ft/s, meeting gait speed goal today. Patient reports plans to join gym and walk at the park with her husband. Practiced ambulation with patients tricane to improve consistency with cane sequencing. Patient continues to demonstrate good progress towards goals. Would benefit from continued skilled PT services to address remaining impairments.    Personal Factors and Comorbidities Comorbidity 3+    Comorbidities CVA due to embolism R PCA; COVID -19, post-viral  fatigue syndrome  PMH COVID 11/2020, arthritis, back pain, CAD, cirrhosis, diverticulitis/osis, GERD, HTN, IBS, osteopenia, PAF    Examination-Activity Limitations Locomotion Level;Transfers;Stand;Caring for Others;Dressing    Examination-Participation Restrictions Meal Prep;Cleaning;Community Activity;Laundry    Stability/Clinical Decision Making Evolving/Moderate complexity    Rehab Potential Good    PT Frequency 2x / week    PT Duration 8 weeks   plus eval, so 9 wks totatl including eval week; will likely try for 1x/wk pool and 1x/wk in-clinic   PT Treatment/Interventions ADLs/Self Care Home Management;Aquatic Therapy;DME Instruction;Neuromuscular re-education;Balance training;Therapeutic exercise;Therapeutic activities;Functional mobility training;Gait training;Patient/family education;Manual techniques    PT Next Visit Plan Work on standing balance, gait activities.  Gait training with cane outdoor surfaces; work on timing/coordination with LLE hip/knee flexion with gait.    Consulted and Agree with Plan of Care Patient             Patient will benefit from skilled therapeutic intervention in order to improve the following deficits and impairments:  Abnormal gait, Difficulty walking, Impaired flexibility, Decreased balance, Decreased mobility, Decreased strength, Postural dysfunction, Pain  Visit Diagnosis: Other abnormalities of gait and mobility  Unsteadiness on feet  Muscle weakness (generalized)     Problem List Patient Active Problem List   Diagnosis Date Noted   COVID-19 02/14/2021   Postviral fatigue syndrome 02/14/2021   Difficulty with adaptive servo-ventilation (ASV) use 02/14/2021   Sepsis secondary to UTI ([facility] 09/08/2020   Elevated ALT measurement 09/08/2020   History of CVA with residual deficit 09/08/2020   Hyperbilirubinemia 09/08/2020   Fatigue associated with anemia 08/03/2020   Cerebrovascular accident (CVA) due to embolism of right posterior cerebral  artery ([facility] 08/03/2020   OSA treated with BiPAP 08/03/2020   Complex sleep apnea syndrome 08/03/2020   Treatment-emergent central sleep apnea 08/03/2020   Chronic intermittent hypoxia with obstructive sleep apnea 04/21/2020   OSA (obstructive sleep apnea) 04/21/2020   History of cardioembolic stroke 178/93/8101  Gait disturbance, post-stroke 03/29/2020   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 ([facility] 03/29/2020   Anxiety    RLQ abdominal pain 10/22/2019   Paroxysmal atrial fibrillation ([facility] 05/22/2019   Iron deficiency anemia 05/07/2019   Atrial fibrillation with RVR ([facility] 10/21/2018   Cerebellar stroke, acute ([facility] 10/21/2018   Streptococcal endocarditis    Endocarditis of mitral valve 10/07/2018   Bacteremia due to Streptococcus Salivarius 10/07/2018   Sepsis ([facility] 075/01/2584  Acute metabolic encephalopathy 027/78/2423  Severe  aortic stenosis 07/08/2018   S/P TAVR (transcatheter aortic valve replacement) 07/08/2018   Esophageal thickening    CAD in native artery 04/22/2018   Gastroesophageal reflux disease    Pulmonary hypertension ([place]) 02/21/2018   Essential hypertension 07/15/2017   History of colonic polyps 05/22/2017   Elevated liver function tests 12/05/2016   DOE (dyspnea on exertion) 07/19/2016   Thalassemia minor 05/29/2016   Left bundle branch block 12/06/2015   Upper airway cough syndrome 37/94/3276   Diastolic heart failure ([place]) 10/10/2015   Myalgia 02/17/2014   Carotid artery stenosis 06/05/2013   Eustachian tube dysfunction 05/07/2013   Neuropathy of leg 03/07/2012   Anemia, unspecified 01/31/2012   Hot flashes 08/24/2010   Diabetes mellitus due to underlying condition, uncontrolled 06/30/2010   Generalized abdominal pain 03/24/2010   Obesity (BMI 30.0-34.9) 12/21/2009   IBS (irritable bowel syndrome) 04/29/2009   Vitamin D deficiency 03/10/2009   Acute bronchitis 03/07/2009   Hypothyroidism 12/13/2008   Dyslipidemia 12/13/2008   Anxiety  state 12/13/2008   Other specified disorders of bladder 12/13/2008    Janene Harvey, PT, DPT 06/06/21 12:36 PM   [facility] [address] [place], [place], [address] Phone: [phone]   Fax:  [phone]  Name: AKITA MAXIM MRN: [id number] Date of Birth: 12-Jun-1945

## 2021-06-06 NOTE — Telephone Encounter (Signed)
Called the patient.  There was no answer.  Unable to leave a voicemail. Patient is on the schedule for Dr. Brett Fairy tomorrow.  Appears to be for a stroke related concern.  Patient sees Dr. Leonie Man and Janett Billow, NP in regards to stroke concerns.  Called the patient to cancel the appointment with Dr. Brett Fairy as it was incorrectly scheduled.  I will send a MyChart message to the patient since I was unable to leave a voicemail.  If the patient calls back in the morning please see if there is a way to get the patient worked in with [facility] this week.

## 2021-06-07 ENCOUNTER — Ambulatory Visit: Payer: Medicare Other | Admitting: Occupational Therapy

## 2021-06-07 ENCOUNTER — Ambulatory Visit: Payer: Medicare Other | Admitting: Neurology

## 2021-06-07 NOTE — Telephone Encounter (Signed)
Contacted pt, no answer or VM available. Will send pt mychart message.

## 2021-06-07 NOTE — Telephone Encounter (Signed)
Contacted pt again no answer, will remove held slot.

## 2021-06-07 NOTE — Telephone Encounter (Signed)
Contacted alternative number (home), no answer

## 2021-06-08 ENCOUNTER — Telehealth: Payer: Self-pay | Admitting: Pharmacist

## 2021-06-08 NOTE — Chronic Care Management (AMB) (Signed)
Chronic Care Management Pharmacy Assistant   Name: Breanna Webster  MRN: [id number] DOB: 09-04-1945  Reason for Encounter: Follow up Call per MP   Recent office visits:  05/23/21 [name], RN - Nurse CCM Visit  Recent consult visits:  06/06/21 [name] (PT) - Patient presented for Other abnormalities of gait and mobility and other concerns. No medication changes.  Hospital visits:  Medication Reconciliation was completed by comparing discharge summary, patients EMR and Pharmacy list, and upon discussion with patient.  Patient presented to [facility] ED on 04/05/21 due to Fall. Patient was present for 4 hours.  New?Medications Started at [facility] Discharge:?? -started  none  Medication Changes at Hospital Discharge: -Changed  none  Medications Discontinued at Hospital Discharge: -Stopped  None  Medications that remain the same after Hospital Discharge:??  -All other medications will remain the same.    Medications: Outpatient Encounter Medications as of 06/08/2021  Medication Sig   amitriptyline (ELAVIL) 25 MG tablet Take 1 tablet (25 mg total) by mouth at bedtime.   amoxicillin-clavulanate (AUGMENTIN) 875-125 MG tablet Take 1 tablet by mouth twice daily for 10 days   apixaban (ELIQUIS) 5 MG TABS tablet TAKE ONE TABLET BY MOUTH TWICE A DAY   b complex vitamins capsule Take 1 capsule by mouth daily.   BD PEN NEEDLE NANO 2ND GEN 32G X 4 MM MISC    cholecalciferol (VITAMIN D) 25 MCG (1000 UNIT) tablet Take 1,000 Units by mouth daily.   diltiazem (CARDIZEM CD) 120 MG 24 hr capsule Take 1 capsule (120 mg total) by mouth daily.   escitalopram (LEXAPRO) 20 MG tablet TAKE ONE TABLET BY MOUTH DAILY   estradiol (ESTRACE) 0.1 MG/GM vaginal cream Place vaginally.   estradiol (ESTRACE) 0.1 MG/GM vaginal cream Place 1 applicatorful into the vagina 2 nights per week   Evolocumab (REPATHA SURECLICK) 759 MG/ML SOAJ Inject 1 dose into the skin  every 14 (fourteen) days.   famotidine (PEPCID) 20 MG tablet Take 1 tablet by mouth every night at bedtime   glucose blood test strip Use to check blood sugar 3 times daily   Insulin Pen Needle 32G X 4 MM MISC Use to inject 4 times daily   Insulin Syringe-Needle U-100 (ULTICARE INSULIN SYRINGE) 31G X 5/16" 1 ML MISC FOUR TIMES A DAY   Insulin Syringe-Needle U-100 31G X 5/16" 1 ML MISC Use for injections 4 times daily   Lancets (ONETOUCH DELICA PLUS [id number]) MISC USE TO CHECK BLOOD SUGAR TWICE DAILY.Marland Kitchen   Lancets (ONETOUCH DELICA PLUS [id number]) MISC Use 1 lancet 2 times daily   Lancets (ONETOUCH DELICA PLUS [id number]) MISC Use 1 lancet 2 times daily   levothyroxine (SYNTHROID) 125 MCG tablet Take 1 tablet by mouth every morning on an empty stomach   levothyroxine (SYNTHROID) 137 MCG tablet Take 137 mcg by mouth once.   levothyroxine (SYNTHROID) 137 MCG tablet Take 1 tablet by mouth every morning on an empty stomach   losartan (COZAAR) 50 MG tablet TAKE ONE TABLET BY MOUTH DAILY   metFORMIN (GLUCOPHAGE-XR) 500 MG 24 hr tablet Take 500 mg by mouth 2 (two) times daily.   metFORMIN (GLUCOPHAGE-XR) 500 MG 24 hr tablet Take 1 tablet by mouth 2 times daily   mirabegron ER (MYRBETRIQ) 50 MG TB24 tablet Take 1 tablet by mouth once daily.   mirabegron ER (MYRBETRIQ) 50 MG TB24 tablet Take 50 mg by mouth daily.   Multiple Vitamin (MULITIVITAMIN WITH MINERALS) TABS Take 1  tablet by mouth daily.   nitroGLYCERIN (NITROSTAT) 0.4 MG SL tablet Place 1 tablet (0.4 mg total) under the tongue every 5 (five) minutes as needed for chest pain.   NOVOLIN N 100 UNIT/ML injection Inject 28 Units into the skin daily before breakfast.   NOVOLIN R 100 UNIT/ML injection 8 Units 2 (two) times daily before a meal. 10 UNITS THIRTY MINUTES BEFORE MEALS.  5 ADDITIONAL UNITS WITH CARBS OR SNACKS. (04/26/2021:  Pt reports she only takes the 8 units 2x/daily before meals, not the 10 units)   omeprazole (PRILOSEC) 20 MG capsule Take 1  capsule (20 mg total) by mouth 2 (two) times daily before a meal.   omeprazole (PRILOSEC) 20 MG capsule Take 1 capsule by mouth twice daily before meals   OneTouch Delica Lancets 35W MISC Tests BS 2 times   ONETOUCH VERIO test strip USE TO MONITOR GLUCOSE LEVELS TWICE DAILY   No facility-administered encounter medications on file as of 06/08/2021.  Notes: Call to patient Per MP advised that she may stop her Amitriptyline per discussion with PCP. She reports she did not notice any changes with mood when cutting back to 1/2 so will stop completely. Advised her if her mood changes with stopping to give me a call and she agreed. She also had questions concerning her Patient Assistance for her insulins with [employer], she reports she still has the application has been trying to reach Dr [name]'s office for dosing instr. Advised her per the instructions included with the application all she needed to do was fill out her portions and the Prescriber will complete the prescription part. She reports she will get this done and send the forms off to the office soon. Also confirmed with her that her ins would be cheaper to fill her Eliquis with local pharm not mail order. She voiced understanding  Care Gaps: Foot Exam- Overdue HGB A1C - Overdue BP- 118/60 ( 06/08/21) CCM- 7/23 AWV- 12/22 Lab Results  Component Value Date   HGBA1C 7.4 (H) 09/08/2020    Star Rating Drugs: Losartan 50 mg  - last filled 04/18/2021 90 DS at [facility] Metformin 500 mg - last filled 02/17/2021 90 DS at [facility]    Patient Assistance: [place] : Patient still in possession of application 8/61/68  [facility] Pharmacist Assistant [phone]

## 2021-06-09 ENCOUNTER — Other Ambulatory Visit (HOSPITAL_BASED_OUTPATIENT_CLINIC_OR_DEPARTMENT_OTHER): Payer: Self-pay

## 2021-06-09 ENCOUNTER — Other Ambulatory Visit: Payer: Self-pay

## 2021-06-09 ENCOUNTER — Ambulatory Visit (INDEPENDENT_AMBULATORY_CARE_PROVIDER_SITE_OTHER): Payer: Medicare Other | Admitting: Pharmacist

## 2021-06-09 ENCOUNTER — Encounter: Payer: Self-pay | Admitting: Hematology

## 2021-06-09 ENCOUNTER — Encounter: Payer: Self-pay | Admitting: Occupational Therapy

## 2021-06-09 ENCOUNTER — Ambulatory Visit: Payer: Medicare Other | Admitting: Occupational Therapy

## 2021-06-09 DIAGNOSIS — E785 Hyperlipidemia, unspecified: Secondary | ICD-10-CM

## 2021-06-09 DIAGNOSIS — M6281 Muscle weakness (generalized): Secondary | ICD-10-CM | POA: Diagnosis not present

## 2021-06-09 DIAGNOSIS — R2689 Other abnormalities of gait and mobility: Secondary | ICD-10-CM | POA: Diagnosis not present

## 2021-06-09 DIAGNOSIS — E1169 Type 2 diabetes mellitus with other specified complication: Secondary | ICD-10-CM

## 2021-06-09 DIAGNOSIS — R2681 Unsteadiness on feet: Secondary | ICD-10-CM | POA: Diagnosis not present

## 2021-06-09 DIAGNOSIS — Z794 Long term (current) use of insulin: Secondary | ICD-10-CM

## 2021-06-09 DIAGNOSIS — R29898 Other symptoms and signs involving the musculoskeletal system: Secondary | ICD-10-CM

## 2021-06-09 DIAGNOSIS — R278 Other lack of coordination: Secondary | ICD-10-CM | POA: Diagnosis not present

## 2021-06-09 NOTE — Patient Instructions (Signed)
Hi Shadoe,  It was great to get to meet you in person!   Please reach out to me if you have any questions or need anything!  Best, Maddie  [name], PharmD, [place] Pharmacist [facility]

## 2021-06-09 NOTE — Patient Instructions (Signed)
Fine Motor Coordination Exercises  Perform the following exercises 10-15 mins 1 times a day, as recommended by your occupational therapist.  Close all fingers and thumb into a tight fist and then open wide. (10 times) Palm of hand on table, spread fingers apart, then together. (10 times) Lift fingers and thumb off table one at a time. Increase speed as able. (10 times) Touch thumb to each fingertip. Increase speed as able. (10 times)  Pick up small objects (coins, marbles, paperclips, beads, buttons, etc) one at a time and place on table. Stack approximately [employer] (checkers, coins, etc.) onto table. Pick up 5 small objects (coins, marbles, paperclips, beads, etc.) one at a time and hold them in hand, then place them one by one into a container.

## 2021-06-09 NOTE — Therapy (Signed)
[facility] [place] [address], [place], [place], [address] Phone: [phone]   Fax:  [phone]  Occupational Therapy Treatment  Patient Details  Name: Breanna Webster MRN: [id number] Date of Birth: 26-Sep-1945 Referring Provider (OT): Dohmeier, Asencion Partridge, MD   Encounter Date: 06/09/2021   OT End of Session - 06/09/21 0001     Visit Number 2    Number of Visits 17    Date for OT Re-Evaluation 07/21/21    Authorization Type Medicare A and B    Authorization - Visit Number 2    Authorization - Number of Visits 17    Progress Note Due on Visit 10    OT Start Time 1103    OT Stop Time 1148    OT Time Calculation (min) 45 min    Activity Tolerance Patient tolerated treatment well    Behavior During Therapy [facility] for tasks assessed/performed             Past Medical History:  Diagnosis Date   Anxiety    Arthritis    "back" (04/22/2018)   Back pain    Blood transfusion without reported diagnosis    CAD (coronary artery disease)    a. 03/2018 s/p PCI/DES to the RCA (3.0x15 Onyx DES).   Carotid artery stenosis    Mild   Chest pain    Chronic lower back pain    Cirrhosis (HCC)    Colon polyps    Diverticulitis    Diverticulosis    Esophageal thickening    seen on pre TAVR CT scan, also questionable cirrhosis. MRI recommended. Will refer to GI   Fatty liver    GERD (gastroesophageal reflux disease)    Grave's disease    History of colonic polyps 05/22/2017   History of hiatal hernia    Hypertension    Hypothyroidism    IBS (irritable bowel syndrome)    Osteopenia    Pulmonary nodules    seen on pre TAVR CT. likley benign. no follow up recommended if pt low risk.   S/P TAVR (transcatheter aortic valve replacement)    Severe aortic stenosis    Shortness of breath on exertion    Stroke ([place])    Thalassemia minor    Thyroid disease    Type II diabetes mellitus ([place])     Past Surgical History:  Procedure Laterality Date    56 HOUR [place] STUDY N/A 03/03/2018   Procedure: 24 HOUR PH STUDY;  Surgeon: Mauri Pole, MD;  Location: [facility];  Service: Endoscopy;  Laterality: N/A;   AORTIC VALVE REPLACEMENT  06/2018   COLONOSCOPY     COLONOSCOPY W/ BIOPSIES AND POLYPECTOMY     CORONARY ANGIOGRAPHY Right 04/21/2018   Procedure: CORONARY ANGIOGRAPHY (CATH LAB);  Surgeon: [name], MD;  Location: [place] CV LAB;  Service: Cardiovascular;  Laterality: Right;   CORONARY STENT INTERVENTION N/A 04/22/2018   Procedure: CORONARY STENT INTERVENTION;  Surgeon: [name], MD;  Location: [place] CV LAB;  Service: Cardiovascular;  Laterality: N/A;   DILATION AND CURETTAGE OF UTERUS     ESOPHAGEAL MANOMETRY N/A 03/03/2018   Procedure: ESOPHAGEAL MANOMETRY (EM);  Surgeon: Mauri Pole, MD;  Location: [facility];  Service: Endoscopy;  Laterality: N/A;   GASTRIC FUNDOPLICATION     HERNIA REPAIR     HYSTEROSCOPY     fibroids   LAPAROSCOPIC CHOLECYSTECTOMY     LAPAROSCOPY     fibroids   NISSEN  FUNDOPLICATION  4132G   POLYPECTOMY     RIGHT/LEFT HEART CATH AND CORONARY ANGIOGRAPHY N/A 02/20/2018   Procedure: RIGHT/LEFT HEART CATH AND CORONARY ANGIOGRAPHY;  Surgeon: [name], MD;  Location: [place] CV LAB;  Service: Cardiovascular;  Laterality: N/A;   TEE WITHOUT CARDIOVERSION N/A 07/08/2018   Procedure: TRANSESOPHAGEAL ECHOCARDIOGRAM (TEE);  Surgeon: Burnell Blanks, MD;  Location: [place];  Service: Open Heart Surgery;  Laterality: N/A;   TEE WITHOUT CARDIOVERSION  10/07/2018   TEE WITHOUT CARDIOVERSION N/A 10/07/2018   Procedure: TRANSESOPHAGEAL ECHOCARDIOGRAM (TEE);  Surgeon: Jerline Pain, MD;  Location: [facility] ENDOSCOPY;  Service: Cardiovascular;  Laterality: N/A;   TONSILLECTOMY     TRANSCATHETER AORTIC VALVE REPLACEMENT, TRANSFEMORAL N/A 07/08/2018   Procedure: TRANSCATHETER AORTIC VALVE REPLACEMENT, TRANSFEMORAL;  Surgeon: Burnell Blanks, MD;  Location: [place];   Service: Open Heart Surgery;  Laterality: N/A;    There were no vitals filed for this visit.   Subjective Assessment - 06/09/21 1108     Subjective  Pt reports starting to try to use her [facility] for mobility.    Pertinent History DM2, CAD, HTN, aortic stenosis status post TAVR in 3/20, hypothyroidism    Limitations significant arthritis in knees    Patient Stated Goals to be able to get back in to the swing of things    Currently in Pain? Yes    Pain Score --   "not really bad"   Pain Location Knee    Pain Orientation Right    Pain Descriptors / Indicators Aching;Discomfort;Dull    Pain Type Chronic pain    Pain Onset More than a month ago             Elida: Engaged in small peg board pattern replication with focus on use of LUE with picking up and placing small pegs in peg board.  Pt requires intermittent assist from L hand to rearrange peg in hand, min cues to attempt to manipulate in hand without assist from LUE.  Therapist providing min cues for organization and sequencing to follow pattern as printed.  Completed 3 hole button/unbutton: 39.56 seconds.  Noted decreased use of index finger with buttoning/unbuttoning.  In-hand manipulation and translation with coins.  Pt picking up coins from table top with cue to pick up and not slide off the table.  Pt stacking coins and unstacking, with increased difficulty when unstacking.  Noted pt to be using thumb and long finger more with precision stacking and unstacking, minimal use of index finger.  Challenged pt in translation with coins to increase use of index finger.  Therapist reinforcing use of index finger with picking up and translation of coins.  Educated on finger isolation and coordination.  Pt able to demonstrate each with min cues for technique.  Provided with [facility] HEP.                       OT Short Term Goals - 06/09/21 1114       OT SHORT TERM GOAL #1   Title Pt will be independent in [facility] HEP to increase  coordination as needed for managing clothing fasteners.    Time 4    Period Weeks    Status On-going    Target Date 06/23/21      OT SHORT TERM GOAL #2   Title Pt will demonstrate improved fine motor coordination for ADLs as evidenced by decreasing 9 hole peg test score for LUE by 10 secs  Baseline R: 35.5 and L: 55.4    Time 4    Period Weeks    Status On-going      OT SHORT TERM GOAL #3   Title Pt will demonstrate increased grip strength in LUE by 5# as needed for opening containers    Baseline R: 43 and L: 30    Time 4    Period Weeks    Status On-going               OT Long Term Goals - 06/09/21 1114       OT LONG TERM GOAL #1   Title Pt will be independent in completing a full body/routine HEP as needed for increased balance and LB flexibility to increase independence with LB dressing.    Time 8    Period Weeks    Status On-going    Target Date 07/21/21      OT LONG TERM GOAL #2   Title Pt will demonstrate improved fine motor coordination for ADLs as evidenced by decreasing 9 hole peg test score for LUE by 15 secs    Baseline R: 35.5 and L: 55.4    Time 8    Period Weeks    Status On-going      OT LONG TERM GOAL #3   Title Pt will demonstrate improved UE functional use for ADLs as evidenced by increasing box/ blocks score by 5 blocks with LUE.    Baseline R; 41 and L: 31    Time 8    Period Weeks    Status On-going      OT LONG TERM GOAL #4   Title Pt will be independent in energy conservation strategies to increase safety and decrease fall risk.    Time 8    Period Weeks    Status On-going                   Plan - 06/09/21 1115     Clinical Impression Statement Pt demonstrating decreased use of index finger with fine motor control tasks with picking up pegs, coins, paper clips.  Encouraged increased attention to utilizing index finger with picking up items and with in-hand manipulation and translation.  Pt receptive to education, still  benefits from therapist drawing attention to index finger during functional tasks.  Pt also with mild L inattention, noted especially with mobility, nearly running into door frame on L x2.    OT Occupational Profile and History Detailed Assessment- Review of Records and additional review of physical, cognitive, psychosocial history related to current functional performance    Occupational performance deficits (Please refer to evaluation for details): ADL's;IADL's    Body Structure / Function / Physical Skills ADL;Balance;Body mechanics;Coordination;Decreased knowledge of use of DME;Dexterity;Endurance;FMC;GMC;Flexibility;Mobility;Pain;ROM;Strength;UE functional use    Rehab Potential Good    Clinical Decision Making Limited treatment options, no task modification necessary    Comorbidities Affecting Occupational Performance: May have comorbidities impacting occupational performance    Modification or Assistance to Complete Evaluation  No modification of tasks or assist necessary to complete eval    OT Frequency 2x / week    OT Duration 8 weeks    OT Treatment/Interventions Self-care/ADL training;Aquatic Therapy;Biofeedback;Cryotherapy;Electrical Stimulation;Moist Heat;Ultrasound;Therapeutic exercise;Neuromuscular education;Energy conservation;DME and/or AE instruction;Functional Mobility Training;Manual Therapy;Passive range of motion;Therapeutic activities;Patient/family education;Balance training;Coping strategies training    Plan review [place] HEP, try theraputty, gross grasp and functional grasp, educ on reducing tremors.    Consulted and Agree with Plan of Care Patient  Patient will benefit from skilled therapeutic intervention in order to improve the following deficits and impairments:   Body Structure / Function / Physical Skills: ADL, Balance, Body mechanics, Coordination, Decreased knowledge of use of DME, Dexterity, Endurance, FMC, GMC, Flexibility, Mobility, Pain, ROM,  Strength, UE functional use       Visit Diagnosis: Muscle weakness (generalized)  Other symptoms and signs involving the musculoskeletal system  Other lack of coordination  Unsteadiness on feet    Problem List Patient Active Problem List   Diagnosis Date Noted   COVID-19 02/14/2021   Postviral fatigue syndrome 02/14/2021   Difficulty with adaptive servo-ventilation (ASV) use 02/14/2021   Sepsis secondary to UTI ([place]) 09/08/2020   Elevated ALT measurement 09/08/2020   History of CVA with residual deficit 09/08/2020   Hyperbilirubinemia 09/08/2020   Fatigue associated with anemia 08/03/2020   Cerebrovascular accident (CVA) due to embolism of right posterior cerebral artery ([place]) 08/03/2020   OSA treated with BiPAP 08/03/2020   Complex sleep apnea syndrome 08/03/2020   Treatment-emergent central sleep apnea 08/03/2020   Chronic intermittent hypoxia with obstructive sleep apnea 04/21/2020   OSA (obstructive sleep apnea) 04/21/2020   History of cardioembolic stroke 27/25/3664   Gait disturbance, post-stroke 03/29/2020   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 ([place]) 03/29/2020   Anxiety    RLQ abdominal pain 10/22/2019   Paroxysmal atrial fibrillation ([place]) 05/22/2019   Iron deficiency anemia 05/07/2019   Atrial fibrillation with RVR ([place]) 10/21/2018   Cerebellar stroke, acute ([place]) 10/21/2018   Streptococcal endocarditis    Endocarditis of mitral valve 10/07/2018   Bacteremia due to Streptococcus Salivarius 10/07/2018   Sepsis ([place]) 40/34/7425   Acute metabolic encephalopathy 95/63/8756   Severe aortic stenosis 07/08/2018   S/P TAVR (transcatheter aortic valve replacement) 07/08/2018   Esophageal thickening    CAD in native artery 04/22/2018   Gastroesophageal reflux disease    Pulmonary hypertension ([place]) 02/21/2018   Essential hypertension 07/15/2017   History of colonic polyps 05/22/2017   Elevated liver function tests 12/05/2016   DOE (dyspnea on  exertion) 07/19/2016   Thalassemia minor 05/29/2016   Left bundle branch block 12/06/2015   Upper airway cough syndrome 43/32/9518   Diastolic heart failure ([place]) 10/10/2015   Myalgia 02/17/2014   Carotid artery stenosis 06/05/2013   Eustachian tube dysfunction 05/07/2013   Neuropathy of leg 03/07/2012   Anemia, unspecified 01/31/2012   Hot flashes 08/24/2010   Diabetes mellitus due to underlying condition, uncontrolled 06/30/2010   Generalized abdominal pain 03/24/2010   Obesity (BMI 30.0-34.9) 12/21/2009   IBS (irritable bowel syndrome) 04/29/2009   Vitamin D deficiency 03/10/2009   Acute bronchitis 03/07/2009   Hypothyroidism 12/13/2008   Dyslipidemia 12/13/2008   Anxiety state 12/13/2008   Other specified disorders of bladder 12/13/2008    Simonne Come, OT 06/09/2021, 11:57 AM  [facility] [address] [place], [place], [address] Phone: [phone]   Fax:  [phone]  Name: HALEIGH DESMITH MRN: [id number] Date of Birth: [birth year]

## 2021-06-09 NOTE — Progress Notes (Signed)
Chronic Care Management Pharmacy Note  06/09/2021 Name:  Breanna Webster MRN:  [id number] DOB:  Dec 03, 1945  Summary: BP is at goal < 130/80 A1c is at goal < 7%  Recommendations/Changes made from today's visit: -Recommended stopping amitriptyline as patient has not noticed a difference with being on 1/2 tablet (removed from medication list) -Reapplied for patient assistance for insulins in office  Plan: Follow up BP/DM assessment in 2 months Follow up in 4 months   Subjective: Breanna Webster is an 76 y.o. year old female who is a primary patient of [name], NP.  The CCM team was consulted for assistance with disease management and care coordination needs.    Engaged with patient by telephone for follow up visit in response to provider referral for pharmacy case management and/or care coordination services.   Consent to Services:  The patient was given information about Chronic Care Management services, agreed to services, and gave verbal consent prior to initiation of services.  Please see initial visit note for detailed documentation.   Patient Care Team: [name], NP as PCP - General (Family Medicine) [name], MD as PCP - Cardiology (Cardiology) Madelin Rear, MD as Consulting Physician (Endocrinology) [name], RN as Case Manager Garvin Fila, MD as Referring Physician (Neurology) Viona Gilmore, [facility] as Pharmacist (Pharmacist)  Recent office visits: 05/23/21 [name], RN - Nurse CCM Visit   04/14/21 [name], NP - Patient presented for Gait instability and other concerns. No medication changes.    04/11/21  Patient presented for CCM Nurse Visit.   04/03/21 [name], LPN - Patient presented for Medicare Annual Wellness exam.  01/31/21 [name], RN - Patient presented for [facility] Nurse visit and coordination of needs.   12/19/20 [name], RN - Patient presented for [facility] Nurse visit and  coordination of needs.   12/08/20 [name], NP - Patient presented via [place] for [place] 19 virus. Prescribed Molnupiravir 800 mg.   Recent consult visits: 06/06/21 [name] (PT) - Patient presented for Other abnormalities of gait and mobility and other concerns. No medication changes.  04/13/21 [name], NP (Neurology) - Patient presented for CVA due to embolism of right posterior cerebral artery and other concerns. No medication changes.   04/10/21 [name], [name], RPH (Pharmacist) - Patient presented for essential hypertension and other concerns.  02/09/21 [name], MD (Neurology) - Patient presented to [facility] for CVA and other concerns. No medication changes.   12/28/20 Dohmeier, Asencion Partridge, MD (Neurology) - Patient presented for CVA and other concerns. No medication changes.   12/27/20 [name], [name], PA-C [name]) - Patient presented for Gastroesophageal reflux disease and other concerns. Changed time for Famotidine 20 mg to bedtime and Omeprazole 20 mg to before meals.   12/05/20 [name], [name] [facility] - Patient presented for Type 2 diabetes mellitus with other diabetic neuro complication and other concerns. No medication changes.   11/24/20 [name], PA ([facility]) - Patient presented for COVID - 19 screening.   11/16/20 [name], [name] [facility] - Patient presented for Type 2 diabetes mellitus with other diabetic neuro complication and other concerns. No medication changes.   11/09/20 [name], [name] [facility] - Patient presented for Adult hypothyroidism and other concerns.No medication changes.   10/21/20 [name] (Family Med) - Patient presented for Unilateral primary osteoarthritis of right knee. X-Ray ordered,  Bursa joint drained and Triamcinolone injection given.   10/17/20 [name] (Urology) - Patient presented for Acute cystitis without hematuria and other concerns. No other details available.   09/07/20 [name], MD (Oncology) - Patient presented for Anemia and other concerns. No medication changes.   09/05/20 [name] (Physical Med) - Patient presented for Unilateral primary osteoarthritis of left hip. Aspiration and Injection of Morning Sun done.  Hospital visits: Medication Reconciliation was completed by comparing discharge summary, patients EMR and Pharmacy list, and upon discussion with patient.   Patient presented to [place] ED on  04/05/21 due to Fall. Patient was present for 4 hours.   New?Medications Started at [facility] Med Discharge:?? -started  None   Medication Changes at Hospital Discharge: -Changed  None   Medications Discontinued at Hospital Discharge: -Stopped None   Medications that remain the same after Hospital Discharge:??  -All other medications will remain the same.      Objective:  Lab Results  Component Value Date   CREATININE 0.59 04/05/2021   BUN 15 04/05/2021   GFR 75.63 03/13/2021   GFRNONAA >60 04/05/2021   GFRAA 99 12/05/2020   NA 138 04/05/2021   K 3.7 04/05/2021   CALCIUM 10.3 04/05/2021   CO2 27 04/05/2021   GLUCOSE 87 04/05/2021    Lab Results  Component Value Date/Time   HGBA1C 7.4 (H) 09/08/2020 04:16 AM   HGBA1C 6.5 05/05/2020 12:00 AM   HGBA1C 7.5 (H) 10/22/2018 04:45 AM   FRUCTOSAMINE 373 (H) 03/08/2016 11:43 AM   GFR 75.63 03/13/2021 11:54 AM   GFR 84.65 09/16/2020 02:43 PM   MICROALBUR 0.8 10/07/2015 01:30 PM   MICROALBUR 1.1 02/08/2014 02:49 PM    Last diabetic Eye exam:  Lab Results  Component Value Date/Time   HMDIABEYEEXA Retinopathy (A) 08/29/2020 12:00 AM    Last diabetic Foot exam: No results found for: HMDIABFOOTEX   Lab Results  Component Value Date   CHOL 188 12/05/2020   HDL 55 12/05/2020   LDLCALC 110 12/05/2020   LDLDIRECT 98.0  05/24/2014   TRIG 113 12/05/2020   CHOLHDL 5.3 10/22/2018    Hepatic Function Latest Ref Rng & Units 04/05/2021 12/27/2020 12/05/2020  Total Protein 6.5 - 8.1 g/dL 6.6 7.2 -  Albumin 3.5 - 5.0 g/dL 3.9 4.1 4.1  AST 15 - 41 U/L 23 28 34  ALT 0 - 44 U/L 28 36(H) 44(A)  Alk Phosphatase 38 - 126 U/L 52 72 80  Total Bilirubin 0.3 - 1.2 mg/dL 0.8 0.9 -  Bilirubin, Direct 0.0 - 0.3 mg/dL - 0.2 -    Lab Results  Component Value Date/Time   TSH 1.44 12/05/2020 12:00 AM   TSH 1.47 05/05/2020 12:00 AM   TSH 5.935 (H) 04/30/2019 11:49 AM   TSH 9.460 (H) 11/12/2018 09:39 AM   TSH 0.322 (L) 10/21/2018 03:30 PM   TSH 0.79 08/05/2017 11:52 AM   FREET4 0.89 04/30/2019 11:49 AM   FREET4 1.78 (H) 10/21/2018 03:30 PM    CBC Latest Ref Rng & Units 04/05/2021 03/13/2021 09/16/2020  WBC 4.0 - 10.5 K/uL 7.3 8.0 8.5  Hemoglobin 12.0 - 15.0 g/dL 10.0(L) 10.5(L) 9.6(L)  Hematocrit 36.0 - 46.0 % 32.7(L) 34.1(L) 29.9(L)  Platelets 150 - 400 K/uL 155 192.0 276.0    Lab Results  Component Value Date/Time   VD25OH 34.2 02/26/2017 11:16 AM   VD25OH 28.0 (L) 11/21/2016 11:13 AM   VD25OH 20.86 (L) 05/29/2016 11:16 AM    Clinical ASCVD:  Yes  The ASCVD Risk score (Arnett DK, et al., 2019) failed to calculate for the following reasons:   The patient has a prior MI or stroke diagnosis    Depression screen [facility] 2/9 04/03/2021 10/03/2020 03/14/2020  Decreased Interest 0 0 0  Down, Depressed, Hopeless 0 0 0  PHQ - 2 Score 0 0 0  Altered sleeping - - 0  Tired, decreased energy - - 0  Change in appetite - - 0  Feeling bad or failure about yourself  - - 0  Trouble concentrating - - 0  Moving slowly or fidgety/restless - - 0  Suicidal thoughts - - 0  PHQ-9 Score - - 0  Difficult doing work/chores - - Not difficult at all  Some recent data might be hidden   CHA2DS2/VAS Stroke Risk Points  Current as of 15 minutes ago     9 >= 2 Points: High Risk  1 - 1.99 Points: Medium Risk  0 Points: Low Risk    Last  Change: N/A      Details    This score determines the patient's risk of having a stroke if the  patient has atrial fibrillation.       Points Metrics  1 Has Congestive Heart Failure:  Yes    Current as of 15 minutes ago  1 Has Vascular Disease:  Yes    Current as of 15 minutes ago  1 Has Hypertension:  Yes    Current as of 15 minutes ago  2 Age:  14    Current as of 15 minutes ago  1 Has Diabetes:  Yes    Current as of 15 minutes ago  2 Had Stroke:  Yes  Had TIA:  No  Had Thromboembolism:  No    Current as of 15 minutes ago  1 Female:  Yes    Current as of 15 minutes ago            Social History   Tobacco Use  Smoking Status Never  Smokeless Tobacco Never   BP Readings from Last 3 Encounters:  04/27/21 118/60  04/26/21 (!) 116/58  04/14/21 116/70   Pulse Readings from Last 3 Encounters:  04/27/21 90  04/26/21 80  04/14/21 73   Wt Readings from Last 3 Encounters:  04/27/21 176 lb (79.8 kg)  04/26/21 176 lb (79.8 kg)  04/14/21 177 lb 12.8 oz (80.6 kg)   BMI Readings from Last 3 Encounters:  04/27/21 29.29 kg/m  04/26/21 29.29 kg/m  04/14/21 29.59 kg/m    Assessment/Interventions: Review of patient past medical history, allergies, medications, health status, including review of consultants reports, laboratory and other test data, was performed as part of comprehensive evaluation and provision of chronic care management services.   SDOH:  (Social Determinants of Health) assessments and interventions performed: Yes   SDOH Screenings   Alcohol Screen: Not on file  Depression (PHQ2-9): Low Risk    PHQ-2 Score: 0  Financial Resource Strain: Low Risk    Difficulty of Paying Living Expenses: Not hard at all  Food Insecurity: No Food Insecurity   Worried About Charity fundraiser in the Last Year: Never true   Ran Out of Food in the Last Year: Never true  Housing: Low Risk    Last Housing Risk Score: 0  Physical Activity: Inactive   Days of Exercise  per Week: 0 days   Minutes of Exercise per Session: 0 min  Social Connections: Socially Integrated   Frequency  of Communication with Friends and Family: More than three times a week   Frequency of Social Gatherings with Friends and Family: More than three times a week   Attends Religious Services: More than 4 times per year   Active Member of [employer] or Organizations: Yes   Attends Music therapist: More than 4 times per year   Marital Status: Married  Stress: No Stress Concern Present   Feeling of Stress : Not at all  Tobacco Use: Low Risk    Smoking Tobacco Use: Never   Smokeless Tobacco Use: Never   Passive Exposure: Not on file  Transportation Needs: No Transportation Needs   Lack of Transportation (Medical): No   Lack of Transportation (Non-Medical): No   Patient reports Dr Forbes Cellar has started her on Estradial and Myrbetriq.   Patient reports she is extremely lethargic and could take a nap twice and day and go to sleep at night and not have a problem. Patient noticed this about 6 months ago and never really recovered from her strokes about 2 years ago and from [place].  Patient just had another sleep study because she was having issues with her CPAP machine. She is waiting to get the results of her sleep study. Patient is getting up twice a night to go to the bathroom. Patient was tired before she starting using the machine. Patient goes to bed around the same time each night and does watch TV before bedtime.  Patient is having issues with estradiol cream right now as she is not sure about the amount she should be using.   CCM Care Plan  Allergies  Allergen Reactions   Statins Other (See Comments)    Muscle aches    Medications Reviewed Today     Reviewed by Viona Gilmore, [facility] (Pharmacist) on 06/09/21 at 1654  Med List Status: <None>   Medication Order Taking? Sig Documenting Provider Last Dose Status Informant    Discontinued 06/09/21 1654 (Discontinued by  provider)   amoxicillin-clavulanate (AUGMENTIN) 875-125 MG tablet [id number]  Take 1 tablet by mouth twice daily for 10 days   Active   apixaban (ELIQUIS) 5 MG TABS tablet [id number]  TAKE ONE TABLET BY MOUTH TWICE A DAY Nafziger, Cory, NP  Active   b complex vitamins capsule [id number] No Take 1 capsule by mouth daily. [provider] Taking Active   BD PEN NEEDLE NANO 2ND GEN 32G X 4 MM MISC [id number] No  [provider] Taking Active   cholecalciferol (VITAMIN D) 25 MCG (1000 UNIT) tablet [id number] No Take 1,000 Units by mouth daily. [provider] Taking Active Self  diltiazem (CARDIZEM CD) 120 MG 24 hr capsule [id number]  Take 1 capsule (120 mg total) by mouth daily. Nafziger, Tommi Rumps, NP  Active   escitalopram (LEXAPRO) 20 MG tablet [id number]  TAKE ONE TABLET BY MOUTH DAILY Nafziger, Tommi Rumps, NP  Active   estradiol (ESTRACE) 0.1 MG/GM vaginal cream [id number] No Place vaginally. [provider] Taking Active   estradiol (ESTRACE) 0.1 MG/GM vaginal cream [id number]  Place 1 applicatorful into the vagina 2 nights per week   Active   Evolocumab (REPATHA SURECLICK) 379 MG/ML SOAJ [id number] No Inject 1 dose into the skin every 14 (fourteen) days. [name], MD Taking Active   famotidine (PEPCID) 20 MG tablet [id number]  Take 1 tablet by mouth every night at bedtime   Active   glucose blood test strip [id number]  Use to check blood sugar 3 times daily  Active   Insulin Pen Needle 32G X 4 MM MISC [id number]  Use to inject 4 times daily   Active   Insulin Syringe-Needle U-100 (ULTICARE INSULIN SYRINGE) 31G X 5/16" 1 ML MISC [id number]  FOUR TIMES A DAY Nafziger, Tommi Rumps, NP  Active   Insulin Syringe-Needle U-100 31G X 5/16" 1 ML MISC [id number]  Use for injections 4 times daily   Active   Lancets [facility] DELICA PLUS [id number]) MISC [id number] No USE TO CHECK BLOOD SUGAR TWICE DAILY.Marland Kitchen Renato Shin, MD Taking Active Self  Lancets (ONETOUCH DELICA PLUS [id number]) [place] [id number]   Use 1 lancet 2 times daily   Active   Lancets (ONETOUCH DELICA PLUS [id number]) [place] [id number]  Use 1 lancet 2 times daily   Active   levothyroxine (SYNTHROID) 125 MCG tablet [id number]  Take 1 tablet by mouth every morning on an empty stomach   Active   levothyroxine (SYNTHROID) 137 MCG tablet [id number] No Take 137 mcg by mouth once. [provider] Taking Active   levothyroxine (SYNTHROID) 137 MCG tablet [id number]  Take 1 tablet by mouth every morning on an empty stomach   Active   losartan (COZAAR) 50 MG tablet [id number]  TAKE ONE TABLET BY MOUTH DAILY Nafziger, Tommi Rumps, NP  Active   metFORMIN (GLUCOPHAGE-XR) 500 MG 24 hr tablet [id number] No Take 500 mg by mouth 2 (two) times daily. [provider] Taking Active Self  metFORMIN (GLUCOPHAGE-XR) 500 MG 24 hr tablet [id number]  Take 1 tablet by mouth 2 times daily   Active   mirabegron ER (MYRBETRIQ) 50 MG TB24 tablet [id number] No Take 50 mg by mouth daily. [provider] Taking Active   mirabegron ER (MYRBETRIQ) 50 MG TB24 tablet [id number]  Take 1 tablet by mouth once daily.   Active   Multiple Vitamin (MULITIVITAMIN WITH MINERALS) TABS [id number] No Take 1 tablet by mouth daily. [provider] Taking Active Self  nitroGLYCERIN (NITROSTAT) 0.4 MG SL tablet [id number] No Place 1 tablet (0.4 mg total) under the tongue every 5 (five) minutes as needed for chest pain. [name], MD Taking Active   NOVOLIN N 100 UNIT/ML injection [id number] No Inject 28 Units into the skin daily before breakfast. [provider] Taking Active Self  NOVOLIN R 100 UNIT/ML injection [id number] No 8 Units 2 (two) times daily before a meal. 10 UNITS THIRTY MINUTES BEFORE MEALS.  5 ADDITIONAL UNITS WITH CARBS OR SNACKS. (04/26/2021:  Pt reports she only takes the 8 units 2x/daily before meals, not the 10 units) [provider] Taking Active Self  omeprazole (PRILOSEC) 20 MG capsule [id number] No Take 1 capsule (20 mg total) by mouth  2 (two) times daily before a meal. [name], [name], PA-C Taking Active   omeprazole (PRILOSEC) 20 MG capsule [id number]  Take 1 capsule by mouth twice daily before meals Noralyn Pick, NP  Active   OneTouch Delica Lancets 76K MISC [id number] No Tests BS 2 times [provider] Taking Active   [facility] VERIO test strip [id number] No USE TO MONITOR GLUCOSE LEVELS TWICE DAILY Renato Shin, MD Taking Active Self            Patient Active Problem List   Diagnosis Date Noted   COVID-19 02/14/2021   Postviral fatigue syndrome 02/14/2021   Difficulty with adaptive servo-ventilation (ASV) use 02/14/2021   Sepsis secondary to UTI ([place]) 09/08/2020   Elevated ALT measurement 09/08/2020   History of CVA with residual deficit 09/08/2020   Hyperbilirubinemia 09/08/2020  Fatigue associated with anemia 08/03/2020   Cerebrovascular accident (CVA) due to embolism of right posterior cerebral artery ([place]) 08/03/2020   OSA treated with BiPAP 08/03/2020   Complex sleep apnea syndrome 08/03/2020   Treatment-emergent central sleep apnea 08/03/2020   Chronic intermittent hypoxia with obstructive sleep apnea 04/21/2020   OSA (obstructive sleep apnea) 04/21/2020   History of cardioembolic stroke 54/56/2563   Gait disturbance, post-stroke 03/29/2020   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 ([place]) 03/29/2020   Anxiety    RLQ abdominal pain 10/22/2019   Paroxysmal atrial fibrillation ([place]) 05/22/2019   Iron deficiency anemia 05/07/2019   Atrial fibrillation with RVR ([place]) 10/21/2018   Cerebellar stroke, acute ([place]) 10/21/2018   Streptococcal endocarditis    Endocarditis of mitral valve 10/07/2018   Bacteremia due to Streptococcus Salivarius 10/07/2018   Sepsis ([place]) 89/37/3428   Acute metabolic encephalopathy 76/81/1572   Severe aortic stenosis 07/08/2018   S/P TAVR (transcatheter aortic valve replacement) 07/08/2018   Esophageal thickening    CAD in native artery  04/22/2018   Gastroesophageal reflux disease    Pulmonary hypertension ([place]) 02/21/2018   Essential hypertension 07/15/2017   History of colonic polyps 05/22/2017   Elevated liver function tests 12/05/2016   DOE (dyspnea on exertion) 07/19/2016   Thalassemia minor 05/29/2016   Left bundle branch block 12/06/2015   Upper airway cough syndrome 62/06/5595   Diastolic heart failure ([place]) 10/10/2015   Myalgia 02/17/2014   Carotid artery stenosis 06/05/2013   Eustachian tube dysfunction 05/07/2013   Neuropathy of leg 03/07/2012   Anemia, unspecified 01/31/2012   Hot flashes 08/24/2010   Diabetes mellitus due to underlying condition, uncontrolled 06/30/2010   Generalized abdominal pain 03/24/2010   Obesity (BMI 30.0-34.9) 12/21/2009   IBS (irritable bowel syndrome) 04/29/2009   Vitamin D deficiency 03/10/2009   Acute bronchitis 03/07/2009   Hypothyroidism 12/13/2008   Dyslipidemia 12/13/2008   Anxiety state 12/13/2008   Other specified disorders of bladder 12/13/2008    Immunization History  Administered Date(s) Administered   Fluad Quad(high Dose 65+) 01/20/2019, 01/06/2020   Hep A / Hep B 06/15/2015, 07/15/2015, 12/27/2015   Influenza Split 01/16/2012   Influenza Whole 01/31/2010   Influenza, High Dose Seasonal PF 01/28/2014, 12/29/2014, 02/17/2016, 01/21/2018   Influenza-Unspecified 12/29/2012, 01/21/2021   PFIZER(Purple Top)SARS-COV-2 Vaccination 05/16/2019, 06/06/2019, 02/06/2020, 02/05/2021   Pneumococcal Conjugate-13 06/14/2017   Pneumococcal Polysaccharide-23 01/28/2012, 01/28/2014   Pneumococcal-Unspecified 04/24/2015   Tdap 12/29/2014   Zoster Recombinat (Shingrix) 03/13/2017, 05/24/2017   Zoster, Live 02/14/2012    Conditions to be addressed/monitored:  Hypertension, Hyperlipidemia, Diabetes, Atrial Fibrillation, Coronary Artery Disease, GERD, Hypothyroidism, Overactive Bladder, and Neuropathy  Conditions addressed this visit: Afib, diabetes,  hyperlipidemia  Care Plan : CCM Pharmacy Care Plan  Updates made by Viona Gilmore, [place] since 06/09/2021 12:00 AM     Problem: Problem: Hypertension, Hyperlipidemia, Diabetes, Atrial Fibrillation, Coronary Artery Disease, GERD, Hypothyroidism, Overactive Bladder, and Neuropathy      Long-Range Goal: Patient-Specific Goal   Start Date: 02/20/2021  Expected End Date: 02/20/2022  Recent Progress: On track  Priority: High  Note:   Current Barriers:  Unable to independently monitor therapeutic efficacy Unable to maintain control of overactive bladder Suboptimal therapeutic regimen for diabetes  Pharmacist Clinical Goal(s):  Patient will achieve adherence to monitoring guidelines and medication adherence to achieve therapeutic efficacy maintain control of diabetes as evidenced by A1c  through collaboration with PharmD and provider.   Interventions: 1:1 collaboration with [name], NP regarding development and update of  comprehensive plan of care as evidenced by provider attestation and co-signature Inter-disciplinary care team collaboration (see longitudinal plan of care) Comprehensive medication review performed; medication list updated in electronic medical record  Hypertension (BP goal <130/80) -Controlled -Current treatment: Losartan 50 mg 1 tablet daily - Appropriate, Effective, Safe, Accessible Diltiazem 120 mg 1 capsule daily - Appropriate, Effective, Safe, Accessible -Medications previously tried: n/a -Current home readings: 135/70 (normal - that his high for her) - 110-120/70 (wrist cuff) -Current dietary habits: not a salt person; cooking at home; pre-packed vegetables (no canned vegetables) -Current exercise habits: not been able to exercise because she cannot walk with knees and back -Denies hypotensive/hypertensive symptoms -Educated on Importance of home blood pressure monitoring; Proper BP monitoring technique; Symptoms of hypotension and importance of  maintaining adequate hydration; -Counseled to monitor BP at home weekly, document, and provide log at future appointments -Counseled on diet and exercise extensively Recommended to continue current medication Recommended purchasing an arm blood pressure cuff to ensure accuracy.  Hyperlipidemia: (LDL goal < 70) -Uncontrolled -Current treatment: Repatha inject every 14 day - Appropriate, Query effective, Safe, Accessible -Medications previously tried: statins (muscle aches), Zetia -Current dietary patterns: did not discuss -Current exercise habits: unable to -Educated on Cholesterol goals;  -Counseled on diet and exercise extensively Recommended to continue current medication  CAD/History of stroke (Goal: prevent future events) -Controlled -Current treatment  Eliquis 5 mg 1 tablet twice daily - Appropriate, Effective, Safe, Accessible Nitroglycerin 0.4 mg 1 tablet as needed - Appropriate, Effective, Safe, Accessible -Medications previously tried: aspirin  -Recommended to continue current medication Recommended checking expiration date for nitroglycerin.  Diabetes (A1c goal <7%) -Controlled (per patient report of recent A1c of 5.9%) -Current medications: Novolin N 28 units before breakfast - Query Appropriate, Query effective, Safe, Query accessible Novolin R 8 units in morning and at night - Query Appropriate, Query effective, Safe, Query accessible Metformin XR 500 mg 1 tablet twice daily - Appropriate, Query effective, Safe, Accessible -Medications previously tried: other insulins (switched due to cost) -Current home glucose readings fasting glucose: 109, 167, 169, 137, 162, 160 (twice a day) post prandial glucose: 152, 154, 129, 187 (right before dinner) -Reports hypoglycemic/hyperglycemic symptoms (lethargy) -Current meal patterns:  breakfast: bran cereal or 2 eggs and toast or yogurt or croissant with cheese  lunch: not taking insulin in afternoon  dinner: meat and  vegetables (varies) snacks: n/a drinks: water; stopped drinking ice brand -Current exercise: unable to -Educated on A1c and blood sugar goals; Prevention and management of hypoglycemic episodes; Benefits of routine self-monitoring of blood sugar; Carbohydrate counting and/or plate method -Counseled to check feet daily and get yearly eye exams -Counseled on diet and exercise extensively Recommended to continue current medication Recommended discussion with endo about starting a Dexcom or Freestyle Libre and switching insulins to newer and longer lasting ones (Basaglar and Humalog)  Atrial Fibrillation (Goal: prevent stroke and major bleeding) -Controlled -CHADSVASC: 9 -Current treatment: Rate control: Diltiazem 120 mg 1 capsule daily - Appropriate, Effective, Safe, Accessible Anticoagulation: Eliquis 5 mg 1 tablet twice daily - Appropriate, Effective, Safe, Query accessible -Medications previously tried: none -Home BP and HR readings: normal at home is 68 -Counseled on increased risk of stroke due to Afib and benefits of anticoagulation for stroke prevention; bleeding risk associated with Eliquis and importance of self-monitoring for signs/symptoms of bleeding; avoidance of NSAIDs due to increased bleeding risk with anticoagulants; -Recommended to continue current medication Assessed patient finances. Patient does not qualify for patient assistance. Will find  out why her copay is so high.  Depression/Anxiety (Goal: minimize symptoms) -Controlled -Current treatment: Escitalopram 20 mg 1 tablet daily - Appropriate, Effective, Safe, Accessible -Medications previously tried/failed: unknown -PHQ9: 0 -GAD7: n/a -Educated on Benefits of medication for symptom control Benefits of cognitive-behavioral therapy with or without medication -Recommended to continue current medication   Hypothyroidism (Goal: TSH 2.5-4.5) -Not ideally controlled -Current treatment  Levothyroxine 137 mcg 1  tablet every morning before breakfast  - Appropriate, Effective, Query Safe, Accessible -Medications previously tried: none  -Recommended repeat TSH due to fatigue.  Overactive bladder (Goal: minimize symptoms) -Controlled -Current treatment  Myrbetriq 50 mg 1 tablet daily - Appropriate, Query effective, Safe, Query accessible -Medications previously tried: none  -Recommended moving to bedtime to see if this helps frequent bathroom trips overnight.  GERD/Barrett's esophagus (Goal: minimize symptoms and protect esophagus) -Not ideally controlled -Current treatment  Omeprazole 20 mg 1 capsule twice daily - stopped taking Famotidine 20 mg 1 tablet at bedtime - stopped taking -Medications previously tried: none  -Counseled on benefits of taking omeprazole with Barrett's esophagus. Recommended to restart omeprazole at least once daily.  Osteopenia (Goal prevent fractures) -Controlled -Last DEXA Scan: 04/08/2017   T-Score femoral neck: -1.8  T-Score total hip: n/a  T-Score lumbar spine: -2.0  T-Score forearm radius: n/a  10-year probability of major osteoporotic fracture: 11.3%  10-year probability of hip fracture: 2.3% -Patient is not a candidate for pharmacologic treatment -Current treatment  Vitamin D 1000 units daily -Medications previously tried: none  -Recommend [id number] units of vitamin D daily. Recommend 1200 mg of calcium daily from dietary and supplemental sources. Recommend weight-bearing and muscle strengthening exercises for building and maintaining bone density. -Recommended repeat DEXA.  Osteoarthritis (Goal: minimize pain) -Uncontrolled -Current treatment  Acetaminophen 500 mg 2 capsules as needed - Appropriate, Effective, Safe, Accessible -Medications previously tried: n/a  -Recommended trial of Voltaren gel for knee pain.  Health Maintenance -Vaccine gaps: influenza, COVID booster -Current therapy:  Multivitamin 1 tablet daily Vitamin B complex  daily -Educated on Cost vs benefit of each product must be carefully weighed by individual consumer -Patient is satisfied with current therapy and denies issues -Recommended to continue current medication  Patient Goals/Self-Care Activities Patient will:  - check glucose at least daily, document, and provide at future appointments check blood pressure weekly, document, and provide at future appointments target a minimum of 150 minutes of moderate intensity exercise weekly  Follow Up Plan: The care management team will reach out to the patient again over the next 60 days.        Medication Assistance:  Insulins obtained through [employer] medication assistance program.  Enrollment ends 04/21/21  Compliance/Adherence/Medication fill history: Care Gaps: Foot Exam - Overdue COVID Booster #4 Therapist, music) - Overdue Flu Vaccine - Overdue A1C 7.4 BP- 116/70  Star-Rating Drugs: Metformin (Glucophage) 500 mg - Last filled 02/17/21 90 DS at [facility] Losartan (Cozaar) 50 mg - Last filled 01/19/21 90 DS at [facility]  Patient's preferred pharmacy is:  [facility] [place] [address] Phone: [phone] Fax: [facility] [phone] [address], [address] [place] [address] Phone: [phone] Fax: [phone]   Uses pill box? Yes Pt endorses 90% compliance  We discussed: Current pharmacy is preferred with insurance plan and patient is satisfied with pharmacy services Patient decided to: Continue current medication management strategy  Care Plan and Follow Up Patient Decision:  Patient  agrees to Care Plan and Follow-up.  Plan: The care management team will reach out to the patient again over the next 60 days.  [name], PharmD, [place] Pharmacist [facility]

## 2021-06-12 ENCOUNTER — Encounter: Payer: Self-pay | Admitting: Physical Therapy

## 2021-06-12 ENCOUNTER — Other Ambulatory Visit (HOSPITAL_BASED_OUTPATIENT_CLINIC_OR_DEPARTMENT_OTHER): Payer: Self-pay

## 2021-06-12 ENCOUNTER — Ambulatory Visit: Payer: Medicare Other | Admitting: Physical Therapy

## 2021-06-12 ENCOUNTER — Other Ambulatory Visit: Payer: Self-pay

## 2021-06-12 DIAGNOSIS — R2681 Unsteadiness on feet: Secondary | ICD-10-CM

## 2021-06-12 DIAGNOSIS — R2689 Other abnormalities of gait and mobility: Secondary | ICD-10-CM

## 2021-06-12 DIAGNOSIS — R29898 Other symptoms and signs involving the musculoskeletal system: Secondary | ICD-10-CM | POA: Diagnosis not present

## 2021-06-12 DIAGNOSIS — R278 Other lack of coordination: Secondary | ICD-10-CM | POA: Diagnosis not present

## 2021-06-12 DIAGNOSIS — M6281 Muscle weakness (generalized): Secondary | ICD-10-CM | POA: Diagnosis not present

## 2021-06-12 NOTE — Therapy (Signed)
[facility] [address] [place], [place], [address] Phone: [phone]   Fax:  [phone]  Physical Therapy Treatment  Patient Details  Name: Breanna Webster MRN: [id number] Date of Birth: 1945-08-14 Referring Provider (PT): Dohmeier, Asencion Partridge   Encounter Date: 06/12/2021   PT End of Session - 06/12/21 1949     Visit Number 12    Number of Visits 17    Date for PT Re-Evaluation 06/23/21    Authorization Type Medicare/Generic Commercial    Progress Note Due on Visit 20    PT Start Time 1400    PT Stop Time 1445    PT Time Calculation (min) 45 min    Equipment Utilized During Treatment Other (comment)   large yellow noodle, small bar bells   Activity Tolerance Patient tolerated treatment well    Behavior During Therapy [facility] for tasks assessed/performed             Past Medical History:  Diagnosis Date   Anxiety    Arthritis    "back" (04/22/2018)   Back pain    Blood transfusion without reported diagnosis    CAD (coronary artery disease)    a. 03/2018 s/p PCI/DES to the RCA (3.0x15 Onyx DES).   Carotid artery stenosis    Mild   Chest pain    Chronic lower back pain    Cirrhosis (HCC)    Colon polyps    Diverticulitis    Diverticulosis    Esophageal thickening    seen on pre TAVR CT scan, also questionable cirrhosis. MRI recommended. Will refer to GI   Fatty liver    GERD (gastroesophageal reflux disease)    Grave's disease    History of colonic polyps 05/22/2017   History of hiatal hernia    Hypertension    Hypothyroidism    IBS (irritable bowel syndrome)    Osteopenia    Pulmonary nodules    seen on pre TAVR CT. likley benign. no follow up recommended if pt low risk.   S/P TAVR (transcatheter aortic valve replacement)    Severe aortic stenosis    Shortness of breath on exertion    Stroke ([place])    Thalassemia minor    Thyroid disease    Type II diabetes mellitus ([place])     Past Surgical  History:  Procedure Laterality Date   57 HOUR [place] STUDY N/A 03/03/2018   Procedure: 24 HOUR PH STUDY;  Surgeon: Mauri Pole, MD;  Location: [facility];  Service: Endoscopy;  Laterality: N/A;   AORTIC VALVE REPLACEMENT  06/2018   COLONOSCOPY     COLONOSCOPY W/ BIOPSIES AND POLYPECTOMY     CORONARY ANGIOGRAPHY Right 04/21/2018   Procedure: CORONARY ANGIOGRAPHY (CATH LAB);  Surgeon: [name], MD;  Location: [facility] CV [facility];  Service: Cardiovascular;  Laterality: Right;   CORONARY STENT INTERVENTION N/A 04/22/2018   Procedure: CORONARY STENT INTERVENTION;  Surgeon: [name], MD;  Location: [facility] CV LAB;  Service: Cardiovascular;  Laterality: N/A;   DILATION AND CURETTAGE OF UTERUS     ESOPHAGEAL MANOMETRY N/A 03/03/2018   Procedure: ESOPHAGEAL MANOMETRY (EM);  Surgeon: Mauri Pole, MD;  Location: [facility];  Service: Endoscopy;  Laterality: N/A;   GASTRIC FUNDOPLICATION     HERNIA REPAIR     HYSTEROSCOPY     fibroids   LAPAROSCOPIC CHOLECYSTECTOMY     LAPAROSCOPY     fibroids   NISSEN FUNDOPLICATION  4742V   POLYPECTOMY  RIGHT/LEFT HEART CATH AND CORONARY ANGIOGRAPHY N/A 02/20/2018   Procedure: RIGHT/LEFT HEART CATH AND CORONARY ANGIOGRAPHY;  Surgeon: [name], MD;  Location: [facility];  Service: Cardiovascular;  Laterality: N/A;   TEE WITHOUT CARDIOVERSION N/A 07/08/2018   Procedure: TRANSESOPHAGEAL ECHOCARDIOGRAM (TEE);  Surgeon: Burnell Blanks, MD;  Location: [facility];  Service: Open Heart Surgery;  Laterality: N/A;   TEE WITHOUT CARDIOVERSION  10/07/2018   TEE WITHOUT CARDIOVERSION N/A 10/07/2018   Procedure: TRANSESOPHAGEAL ECHOCARDIOGRAM (TEE);  Surgeon: Jerline Pain, MD;  Location: [facility] ENDOSCOPY;  Service: Cardiovascular;  Laterality: N/A;   TONSILLECTOMY     TRANSCATHETER AORTIC VALVE REPLACEMENT, TRANSFEMORAL N/A 07/08/2018   Procedure: TRANSCATHETER AORTIC VALVE REPLACEMENT, TRANSFEMORAL;  Surgeon: Burnell Blanks, MD;  Location: [facility];  Service: Open Heart Surgery;  Laterality: N/A;    There were no vitals filed for this visit.   Subjective Assessment - 06/12/21 1946     Subjective Pt reports her walking is getting a little better - feels that balance is improving; pt requests more aquatic therapy sessions (currently has only 1 more scheduled after today's session)    Pertinent History significant arthritis in knees    Patient Stated Goals Pt's goals for therapy are to get better with walking and with arthritis.    Currently in Pain? No/denies                 Aquatic therapy at [facility] - pool temp 92 degrees  Patient seen for aquatic therapy today.  Treatment took place in water 3.5-4.5 feet deep depending upon activity.  Pt entered & exited the pool  the pool via step negotiation, using a step by step sequence with descension with use of bil. Hand rails and using a step over step sequence with ascension at end of session - with use of 1 hand rail only.   Pt performed water walking 18' x 4 reps forwards across width of pool using large yellow noodle for UE support with CGA; 18' x 4 reps with UE support with use of noodle; 18' x 2 reps backwards across width of pool with UE support on noodle   Pt performed marching in place 10 reps with bil. UE support on noodle; marching forwards 18' x 1 rep with UE support on noodle, then 18' x 1 rep with use of bar bells   Pt performed standing balance exercises with UE support on edge of ppol - marching in place 10 reps each leg; forward, back and side kicks - alternating legs 10 reps each  Pt performed stepping strategy exercise for improved balance - pt stepped forward/back 5 reps each leg; then out/in 5 reps each leg with UE support on PT's forearms as needed for balance  Pt performed SLS activity on each leg - standing near edge of pool with UE support on side - pt performed circles clockwise and counterclockwise 5  reps each direction each leg   Pt performed water walking (forward) 18' x 4 reps with no UE support at end of session - only close SBA provided but pt had no LOB  Pt requires buoyancy of water for support and safety with standing balance exercises; exercises are able to be safely attempted/performed in water without or with minimal UE support that cannot be performed safely on land.  Viscosity of water is needed for resistance for strengthening.  Current of water provides perturbations for challenges with standing balance.  Buoyancy of water also  provides joint offloading which increases comfort and reduces pain in knees with weight bearing activities and exs in water, whereas, this is unable to be achieved with weight bearing exercises on land.                                  PT Short Term Goals - 06/12/21 1950       PT SHORT TERM GOAL #1   Title Pt will be independent with HEP for improved strength and flexibility, balance, transfers, and gait.   TARGET 05/26/2021    Time 4    Period Weeks    Status Achieved      PT SHORT TERM GOAL #2   Title Pt will improve Berg score to at least 41/56 to decrease fall risk.    Baseline 36/56    Time 4    Period Weeks    Status Achieved      PT SHORT TERM GOAL #3   Title Pt will improve TUG score to less than or equal to 21 sec for decreased fall risk.    Baseline 26.88 sec    Time 4    Period Weeks    Status Achieved      PT SHORT TERM GOAL #4   Title Pt will verbalize understanding of fall prevention in home environment.    Time 4    Period Weeks    Status Achieved               PT Long Term Goals - 06/12/21 1950       PT LONG TERM GOAL #1   Title Pt will be independent with HEP for improved flexibility and strength, balance, transfers, and gait.  TARGET 06/23/2021    Time 9    Period Weeks    Status Partially Met   met for current     PT LONG TERM GOAL #2   Title Pt will improve Berg score to at  least 46/56 to decrease fall risk.    Time 9    Period Weeks    Status On-going   44/56     PT LONG TERM GOAL #3   Title Pt will improve TUG score to less than or equal to 16 sec for decreased fall risk.    Time 9    Period Weeks    Status Partially Met   16.31 sec     PT LONG TERM GOAL #4   Title Pt will improve gait velocity to at least 2 ft/sec for improved gait efficiency and safety.    Baseline 1.73 ft/sec    Time 9    Period Weeks    Status Achieved   2.69 ft/s     PT LONG TERM GOAL #5   Title Pt will verbalize plans for continued community fitness upon d/c to maximize gains made in PT.    Time 9    Period Weeks    Status Achieved   reports plans to join gym and walk at [employer] - 06/12/21 1952     Clinical Impression Statement Aquatic therapy session focused on balance and gait training in 4' water depth with use of noodle for UE support initially and progressing to no use of assistive device with water walking at end of session.  Pt demonstrates increased confidence and  comfort in water as well as improved balance.  Pt does continue to use some UE support on pool edge with SLS activities for stability and assist with balance recovery.  Pt noted to negotiate steps to exit pool at end of session using Rt hand rail and a step over step sequence with SBA - did not use step by step sequence as in previous sessions.  Cont with POC.    Personal Factors and Comorbidities Comorbidity 3+    Comorbidities CVA due to embolism R PCA; COVID -19, post-viral fatigue syndrome  PMH COVID 11/2020, arthritis, back pain, CAD, cirrhosis, diverticulitis/osis, GERD, HTN, IBS, osteopenia, PAF    Examination-Activity Limitations Locomotion Level;Transfers;Stand;Caring for Others;Dressing    Examination-Participation Restrictions Meal Prep;Cleaning;Community Activity;Laundry    Stability/Clinical Decision Making Evolving/Moderate complexity    Rehab Potential Good     PT Frequency 2x / week    PT Duration 8 weeks   plus eval, so 9 wks totatl including eval week; will likely try for 1x/wk pool and 1x/wk in-clinic   PT Treatment/Interventions ADLs/Self Care Home Management;Aquatic Therapy;DME Instruction;Neuromuscular re-education;Balance training;Therapeutic exercise;Therapeutic activities;Functional mobility training;Gait training;Patient/family education;Manual techniques    PT Next Visit Plan Work on standing balance, gait activities.  Gait training with cane outdoor surfaces; work on timing/coordination with LLE hip/knee flexion with gait.    Consulted and Agree with Plan of Care Patient             Patient will benefit from skilled therapeutic intervention in order to improve the following deficits and impairments:  Abnormal gait, Difficulty walking, Impaired flexibility, Decreased balance, Decreased mobility, Decreased strength, Postural dysfunction, Pain  Visit Diagnosis: Muscle weakness (generalized)  Unsteadiness on feet  Other abnormalities of gait and mobility     Problem List Patient Active Problem List   Diagnosis Date Noted   COVID-19 02/14/2021   Postviral fatigue syndrome 02/14/2021   Difficulty with adaptive servo-ventilation (ASV) use 02/14/2021   Sepsis secondary to UTI ([place]) 09/08/2020   Elevated ALT measurement 09/08/2020   History of CVA with residual deficit 09/08/2020   Hyperbilirubinemia 09/08/2020   Fatigue associated with anemia 08/03/2020   Cerebrovascular accident (CVA) due to embolism of right posterior cerebral artery ([place]) 08/03/2020   OSA treated with BiPAP 08/03/2020   Complex sleep apnea syndrome 08/03/2020   Treatment-emergent central sleep apnea 08/03/2020   Chronic intermittent hypoxia with obstructive sleep apnea 04/21/2020   OSA (obstructive sleep apnea) 04/21/2020   History of cardioembolic stroke 92/42/6834   Gait disturbance, post-stroke 03/29/2020   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 ([place]) 03/29/2020   Anxiety    RLQ abdominal pain 10/22/2019   Paroxysmal atrial fibrillation ([place]) 05/22/2019   Iron deficiency anemia 05/07/2019   Atrial fibrillation with RVR ([place]) 10/21/2018   Cerebellar stroke, acute ([place]) 10/21/2018   Streptococcal endocarditis    Endocarditis of mitral valve 10/07/2018   Bacteremia due to Streptococcus Salivarius 10/07/2018   Sepsis ([place]) 19/62/2297   Acute metabolic encephalopathy 98/92/1194   Severe aortic stenosis 07/08/2018   S/P TAVR (transcatheter aortic valve replacement) 07/08/2018   Esophageal thickening    CAD in native artery 04/22/2018   Gastroesophageal reflux disease    Pulmonary hypertension ([place]) 02/21/2018   Essential hypertension 07/15/2017   History of colonic polyps 05/22/2017   Elevated liver function tests 12/05/2016   DOE (dyspnea on exertion) 07/19/2016   Thalassemia minor 05/29/2016   Left bundle branch block 12/06/2015   Upper airway cough syndrome 17/40/8144   Diastolic heart  failure ([place]) 10/10/2015   Myalgia 02/17/2014   Carotid artery stenosis 06/05/2013   Eustachian tube dysfunction 05/07/2013   Neuropathy of leg 03/07/2012   Anemia, unspecified 01/31/2012   Hot flashes 08/24/2010   Diabetes mellitus due to underlying condition, uncontrolled 06/30/2010   Generalized abdominal pain 03/24/2010   Obesity (BMI 30.0-34.9) 12/21/2009   IBS (irritable bowel syndrome) 04/29/2009   Vitamin D deficiency 03/10/2009   Acute bronchitis 03/07/2009   Hypothyroidism 12/13/2008   Dyslipidemia 12/13/2008   Anxiety state 12/13/2008   Other specified disorders of bladder 12/13/2008    Alda Lea, PT 06/12/2021, 8:04 PM  [facility] [address] [place], [place], [address] Phone: [phone]   Fax:  [phone]  Name: MORIYA MITCHELL MRN: [id number] Date of Birth: 06-Sep-1945

## 2021-06-13 ENCOUNTER — Ambulatory Visit: Payer: Medicare Other | Admitting: Physical Therapy

## 2021-06-13 ENCOUNTER — Encounter: Payer: Self-pay | Admitting: Physical Therapy

## 2021-06-13 ENCOUNTER — Other Ambulatory Visit (HOSPITAL_BASED_OUTPATIENT_CLINIC_OR_DEPARTMENT_OTHER): Payer: Self-pay

## 2021-06-13 DIAGNOSIS — M6281 Muscle weakness (generalized): Secondary | ICD-10-CM | POA: Diagnosis not present

## 2021-06-13 DIAGNOSIS — R2681 Unsteadiness on feet: Secondary | ICD-10-CM | POA: Diagnosis not present

## 2021-06-13 DIAGNOSIS — R2689 Other abnormalities of gait and mobility: Secondary | ICD-10-CM

## 2021-06-13 DIAGNOSIS — R278 Other lack of coordination: Secondary | ICD-10-CM | POA: Diagnosis not present

## 2021-06-13 DIAGNOSIS — R29898 Other symptoms and signs involving the musculoskeletal system: Secondary | ICD-10-CM | POA: Diagnosis not present

## 2021-06-13 NOTE — Therapy (Signed)
[facility] [address], [place], [place], [address] Phone: [phone]   Fax:  [phone]  Physical Therapy Treatment  Patient Details  Name: Breanna Webster MRN: [id number] Date of Birth: 12-01-1945 Referring Provider (PT): Dohmeier, Asencion Partridge   Encounter Date: 06/13/2021   PT End of Session - 06/13/21 1028     Visit Number 13    Number of Visits 23    Date for PT Re-Evaluation 07/18/21    Authorization Type Medicare/Generic Commercial    Progress Note Due on Visit 20    PT Start Time 1019    PT Stop Time 1100    PT Time Calculation (min) 41 min    Equipment Utilized During Treatment Gait belt   large yellow noodle, small bar bells   Activity Tolerance Patient tolerated treatment well    Behavior During Therapy WFL for tasks assessed/performed             Past Medical History:  Diagnosis Date   Anxiety    Arthritis    "back" (04/22/2018)   Back pain    Blood transfusion without reported diagnosis    CAD (coronary artery disease)    a. 03/2018 s/p PCI/DES to the RCA (3.0x15 Onyx DES).   Carotid artery stenosis    Mild   Chest pain    Chronic lower back pain    Cirrhosis (HCC)    Colon polyps    Diverticulitis    Diverticulosis    Esophageal thickening    seen on pre TAVR CT scan, also questionable cirrhosis. MRI recommended. Will refer to GI   Fatty liver    GERD (gastroesophageal reflux disease)    Grave's disease    History of colonic polyps 05/22/2017   History of hiatal hernia    Hypertension    Hypothyroidism    IBS (irritable bowel syndrome)    Osteopenia    Pulmonary nodules    seen on pre TAVR CT. likley benign. no follow up recommended if pt low risk.   S/P TAVR (transcatheter aortic valve replacement)    Severe aortic stenosis    Shortness of breath on exertion    Stroke ([place])    Thalassemia minor    Thyroid disease    Type II diabetes mellitus ([place])     Past Surgical History:   Procedure Laterality Date   60 HOUR [place] STUDY N/A 03/03/2018   Procedure: 24 HOUR PH STUDY;  Surgeon: Mauri Pole, MD;  Location: [facility];  Service: Endoscopy;  Laterality: N/A;   AORTIC VALVE REPLACEMENT  06/2018   COLONOSCOPY     COLONOSCOPY W/ BIOPSIES AND POLYPECTOMY     CORONARY ANGIOGRAPHY Right 04/21/2018   Procedure: CORONARY ANGIOGRAPHY (CATH LAB);  Surgeon: [name], MD;  Location: [facility];  Service: Cardiovascular;  Laterality: Right;   CORONARY STENT INTERVENTION N/A 04/22/2018   Procedure: CORONARY STENT INTERVENTION;  Surgeon: [name], MD;  Location: [facility];  Service: Cardiovascular;  Laterality: N/A;   DILATION AND CURETTAGE OF UTERUS     ESOPHAGEAL MANOMETRY N/A 03/03/2018   Procedure: ESOPHAGEAL MANOMETRY (EM);  Surgeon: Mauri Pole, MD;  Location: [facility];  Service: Endoscopy;  Laterality: N/A;   GASTRIC FUNDOPLICATION     HERNIA REPAIR     HYSTEROSCOPY     fibroids   LAPAROSCOPIC CHOLECYSTECTOMY     LAPAROSCOPY     fibroids   NISSEN FUNDOPLICATION  1540G  POLYPECTOMY     RIGHT/LEFT HEART CATH AND CORONARY ANGIOGRAPHY N/A 02/20/2018   Procedure: RIGHT/LEFT HEART CATH AND CORONARY ANGIOGRAPHY;  Surgeon: [name], MD;  Location: [facility];  Service: Cardiovascular;  Laterality: N/A;   TEE WITHOUT CARDIOVERSION N/A 07/08/2018   Procedure: TRANSESOPHAGEAL ECHOCARDIOGRAM (TEE);  Surgeon: Burnell Blanks, MD;  Location: [facility];  Service: Open Heart Surgery;  Laterality: N/A;   TEE WITHOUT CARDIOVERSION  10/07/2018   TEE WITHOUT CARDIOVERSION N/A 10/07/2018   Procedure: TRANSESOPHAGEAL ECHOCARDIOGRAM (TEE);  Surgeon: Jerline Pain, MD;  Location: [facility];  Service: Cardiovascular;  Laterality: N/A;   TONSILLECTOMY     TRANSCATHETER AORTIC VALVE REPLACEMENT, TRANSFEMORAL N/A 07/08/2018   Procedure: TRANSCATHETER AORTIC VALVE REPLACEMENT, TRANSFEMORAL;  Surgeon: Burnell Blanks,  MD;  Location: [facility];  Service: Open Heart Surgery;  Laterality: N/A;    There were no vitals filed for this visit.   Subjective Assessment - 06/13/21 1021     Subjective Had a bloody nose this morning. No other symtpoms. Doing well in aquatic therapy.    Pertinent History significant arthritis in knees    Patient Stated Goals Pt's goals for therapy are to get better with walking and with arthritis.    Currently in Pain? No/denies                [facility] PT Assessment - 06/13/21 0001       Assessment   Medical Diagnosis Cerebrovascular accident (CVA) due to embolism of right posterior cerebral artery    Referring Provider (PT) Dohmeier, Asencion Partridge    Onset Date/Surgical Date 12/28/20      Standardized Balance Assessment   Standardized Balance Assessment 10 meter walk test   all objective measures carried over from 06/06/21   10 Meter Walk 12.19 sec      Berg Balance Test   Sit to Stand Able to stand without using hands and stabilize independently    Standing Unsupported Able to stand safely 2 minutes    Sitting with Back Unsupported but Feet Supported on Floor or Stool Able to sit safely and securely 2 minutes    Stand to Sit Controls descent by using hands    Transfers Able to transfer safely, minor use of hands    Standing Unsupported with Eyes Closed Able to stand 10 seconds safely    Standing Unsupported with Feet Together Able to place feet together independently and stand for 1 minute with supervision    From Standing, Reach Forward with Outstretched Arm Can reach forward >12 cm safely (5")    From Standing Position, Pick up Object from Floor Able to pick up shoe, needs supervision    From Standing Position, Turn to Look Behind Over each Shoulder Looks behind from both sides and weight shifts well    Turn 360 Degrees Able to turn 360 degrees safely but slowly    Standing Unsupported, Alternately Place Feet on Step/Stool Able to stand independently and complete 8 steps >20  seconds    Standing Unsupported, One Foot in [employer] balance while stepping or standing    Standing on One Leg Able to lift leg independently and hold 5-10 seconds    Total Score 44      Timed Up and Go Test   TUG Normal TUG    Normal TUG (seconds) 16.31  [place] Adult PT Treatment/Exercise - 06/13/21 0001       Ambulation/Gait   Ambulation/Gait Assistance 5: Supervision;4: Min guard    Ambulation Distance (Feet) 200 Feet    Gait Pattern Step-through pattern;Decreased step length - left;Decreased stance time - right;Decreased dorsiflexion - left;Decreased hip/knee flexion - right;Decreased hip/knee flexion - left;Poor foot clearance - left;Narrow base of support    Ambulation Surface Level;Indoor;Unlevel;Outdoor;Paved    Gait Comments gait training with patients tricane to improve consistency with cane sequencing; verbal and tactile cues and intermittent breaks to stop and reset      Neuro Re-ed    Neuro Re-ed Details  toe tap on 1st, 2nd step, down with CGA 10x, R/L double toe tap on cone 10x with light UE support, static balance on rockerboard with 1 UE support x2 min      Knee/Hip Exercises: Aerobic   Recumbent Bike L1 x 6 min (LEs)                     PT Education - 06/13/21 1043     Education Details discussion on POC and wrote down cane sequencing; advised if practicing outside to have her husband some with    Person(s) Educated Patient    Methods Explanation;Tactile cues;Verbal cues;Handout;Demonstration    Comprehension Verbalized understanding;Returned demonstration              PT Short Term Goals - 06/13/21 1029       PT SHORT TERM GOAL #1   Title Pt will be independent with HEP for improved strength and flexibility, balance, transfers, and gait.   TARGET 05/26/2021    Time 4    Period Weeks    Status Achieved      PT SHORT TERM GOAL #2   Title Pt will improve Berg score to at least 41/56 to decrease  fall risk.    Baseline 36/56    Time 4    Period Weeks    Status Achieved      PT SHORT TERM GOAL #3   Title Pt will improve TUG score to less than or equal to 21 sec for decreased fall risk.    Baseline 26.88 sec    Time 4    Period Weeks    Status Achieved      PT SHORT TERM GOAL #4   Title Pt will verbalize understanding of fall prevention in home environment.    Time 4    Period Weeks    Status Achieved               PT Long Term Goals - 06/13/21 1029       PT LONG TERM GOAL #1   Title Pt will be independent with HEP for improved flexibility and strength, balance, transfers, and gait.  TARGET 06/23/2021    Time 5    Period Weeks    Status Partially Met   met for current   Target Date 07/18/21      PT LONG TERM GOAL #2   Title Pt will improve Berg score to at least 46/56 to decrease fall risk.    Time 5    Period Weeks    Status On-going   44/56   Target Date 07/18/21      PT LONG TERM GOAL #3   Title Pt will improve TUG score to less than or equal to 16 sec for decreased fall risk.    Time 5    Period Weeks  Status Partially Met   16.31 sec   Target Date 07/18/21      PT LONG TERM GOAL #4   Title Pt will improve gait velocity to at least 2 ft/sec for improved gait efficiency and safety.    Baseline 1.73 ft/sec    Time 9    Period Weeks    Status Achieved   2.69 ft/s     PT LONG TERM GOAL #5   Title Pt will verbalize plans for continued community fitness upon d/c to maximize gains made in PT.    Time 9    Period Weeks    Status Achieved   reports plans to join gym and walk at [employer] - 06/13/21 1101     Clinical Impression Statement Patient arrived to session without new complaints. Ambulated into session with cane, requiring correction of proper AD sequencing. Proceeded with gait training with cane indoors/outdoors with intermittent rest breaks to correct pattern. Patient performed dynamic SLS activities with  increased fatigue and slowness on the L LE. Some imbalance evident on compliant surface, requiting 1 UE support and CGA. Patient is demonstrating good progress and effort with therapy. Would benefit from additional skilled PT services 2x/week for 5 weeks to address remaining goals.    Personal Factors and Comorbidities Comorbidity 3+    Comorbidities CVA due to embolism R PCA; COVID -19, post-viral fatigue syndrome  PMH COVID 11/2020, arthritis, back pain, CAD, cirrhosis, diverticulitis/osis, GERD, HTN, IBS, osteopenia, PAF    Examination-Activity Limitations Locomotion Level;Transfers;Stand;Caring for Others;Dressing    Examination-Participation Restrictions Meal Prep;Cleaning;Community Activity;Laundry    Stability/Clinical Decision Making Evolving/Moderate complexity    Rehab Potential Good    PT Frequency 2x / week    PT Duration Other (comment)   5 weeks   PT Treatment/Interventions ADLs/Self Care Home Management;Aquatic Therapy;DME Instruction;Neuromuscular re-education;Balance training;Therapeutic exercise;Therapeutic activities;Functional mobility training;Gait training;Patient/family education;Manual techniques    PT Next Visit Plan Work on standing balance, gait activities.  Gait training with cane outdoor surfaces; work on timing/coordination with LLE hip/knee flexion with gait.    Consulted and Agree with Plan of Care Patient             Patient will benefit from skilled therapeutic intervention in order to improve the following deficits and impairments:  Abnormal gait, Difficulty walking, Impaired flexibility, Decreased balance, Decreased mobility, Decreased strength, Postural dysfunction, Pain  Visit Diagnosis: Muscle weakness (generalized)  Unsteadiness on feet  Other abnormalities of gait and mobility     Problem List Patient Active Problem List   Diagnosis Date Noted   COVID-19 02/14/2021   Postviral fatigue syndrome 02/14/2021   Difficulty with adaptive  servo-ventilation (ASV) use 02/14/2021   Sepsis secondary to UTI ([place]) 09/08/2020   Elevated ALT measurement 09/08/2020   History of CVA with residual deficit 09/08/2020   Hyperbilirubinemia 09/08/2020   Fatigue associated with anemia 08/03/2020   Cerebrovascular accident (CVA) due to embolism of right posterior cerebral artery ([place]) 08/03/2020   OSA treated with BiPAP 08/03/2020   Complex sleep apnea syndrome 08/03/2020   Treatment-emergent central sleep apnea 08/03/2020   Chronic intermittent hypoxia with obstructive sleep apnea 04/21/2020   OSA (obstructive sleep apnea) 04/21/2020   History of cardioembolic stroke 05/39/7673   Gait disturbance, post-stroke 03/29/2020   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 ([place]) 03/29/2020   Anxiety    RLQ abdominal pain 10/22/2019   Paroxysmal  atrial fibrillation ([place]) 05/22/2019   Iron deficiency anemia 05/07/2019   Atrial fibrillation with RVR ([place]) 10/21/2018   Cerebellar stroke, acute ([place]) 10/21/2018   Streptococcal endocarditis    Endocarditis of mitral valve 10/07/2018   Bacteremia due to Streptococcus Salivarius 10/07/2018   Sepsis ([place]) 67/20/9470   Acute metabolic encephalopathy 96/28/3662   Severe aortic stenosis 07/08/2018   S/P TAVR (transcatheter aortic valve replacement) 07/08/2018   Esophageal thickening    CAD in native artery 04/22/2018   Gastroesophageal reflux disease    Pulmonary hypertension ([place]) 02/21/2018   Essential hypertension 07/15/2017   History of colonic polyps 05/22/2017   Elevated liver function tests 12/05/2016   DOE (dyspnea on exertion) 07/19/2016   Thalassemia minor 05/29/2016   Left bundle branch block 12/06/2015   Upper airway cough syndrome 94/76/5465   Diastolic heart failure ([place]) 10/10/2015   Myalgia 02/17/2014   Carotid artery stenosis 06/05/2013   Eustachian tube dysfunction 05/07/2013   Neuropathy of leg 03/07/2012   Anemia, unspecified 01/31/2012   Hot flashes 08/24/2010    Diabetes mellitus due to underlying condition, uncontrolled 06/30/2010   Generalized abdominal pain 03/24/2010   Obesity (BMI 30.0-34.9) 12/21/2009   IBS (irritable bowel syndrome) 04/29/2009   Vitamin D deficiency 03/10/2009   Acute bronchitis 03/07/2009   Hypothyroidism 12/13/2008   Dyslipidemia 12/13/2008   Anxiety state 12/13/2008   Other specified disorders of bladder 12/13/2008    Janene Harvey, PT, DPT 06/13/21 12:38 PM   [facility] [address] [place], [place], [address] Phone: [phone]   Fax:  [phone]  Name: Breanna Webster MRN: [id number] Date of Birth: 1946/01/14

## 2021-06-14 ENCOUNTER — Other Ambulatory Visit: Payer: Self-pay

## 2021-06-14 ENCOUNTER — Ambulatory Visit: Payer: Medicare Other | Admitting: Occupational Therapy

## 2021-06-14 DIAGNOSIS — R2689 Other abnormalities of gait and mobility: Secondary | ICD-10-CM | POA: Diagnosis not present

## 2021-06-14 DIAGNOSIS — R2681 Unsteadiness on feet: Secondary | ICD-10-CM

## 2021-06-14 DIAGNOSIS — R29898 Other symptoms and signs involving the musculoskeletal system: Secondary | ICD-10-CM

## 2021-06-14 DIAGNOSIS — R278 Other lack of coordination: Secondary | ICD-10-CM

## 2021-06-14 DIAGNOSIS — M6281 Muscle weakness (generalized): Secondary | ICD-10-CM | POA: Diagnosis not present

## 2021-06-14 NOTE — Therapy (Signed)
[facility] [address] [place], [place], [address] Phone: [phone]   Fax:  [phone]  Occupational Therapy Treatment  Patient Details  Name: Breanna Webster MRN: [id number] Date of Birth: [birth year] Referring Provider (OT): Dohmeier, Asencion Partridge, MD   Encounter Date: 06/14/2021   OT End of Session - 06/14/21 1210     Visit Number 3    Number of Visits 17    Date for OT Re-Evaluation 07/21/21    Authorization Type Medicare A and B    Authorization - Visit Number 3    Authorization - Number of Visits 17    Progress Note Due on Visit 10    OT Start Time 1152    OT Stop Time 1232    OT Time Calculation (min) 40 min    Activity Tolerance Patient tolerated treatment well    Behavior During Therapy [facility] for tasks assessed/performed             Past Medical History:  Diagnosis Date   Anxiety    Arthritis    "back" (04/22/2018)   Back pain    Blood transfusion without reported diagnosis    CAD (coronary artery disease)    a. 03/2018 s/p PCI/DES to the RCA (3.0x15 Onyx DES).   Carotid artery stenosis    Mild   Chest pain    Chronic lower back pain    Cirrhosis (HCC)    Colon polyps    Diverticulitis    Diverticulosis    Esophageal thickening    seen on pre TAVR CT scan, also questionable cirrhosis. MRI recommended. Will refer to GI   Fatty liver    GERD (gastroesophageal reflux disease)    Grave's disease    History of colonic polyps 05/22/2017   History of hiatal hernia    Hypertension    Hypothyroidism    IBS (irritable bowel syndrome)    Osteopenia    Pulmonary nodules    seen on pre TAVR CT. likley benign. no follow up recommended if pt low risk.   S/P TAVR (transcatheter aortic valve replacement)    Severe aortic stenosis    Shortness of breath on exertion    Stroke ([place])    Thalassemia minor    Thyroid disease    Type II diabetes mellitus ([place])     Past Surgical History:  Procedure Laterality Date    30 HOUR [place] STUDY N/A 03/03/2018   Procedure: 24 HOUR PH STUDY;  Surgeon: Mauri Pole, MD;  Location: [facility];  Service: Endoscopy;  Laterality: N/A;   AORTIC VALVE REPLACEMENT  06/2018   COLONOSCOPY     COLONOSCOPY W/ BIOPSIES AND POLYPECTOMY     CORONARY ANGIOGRAPHY Right 04/21/2018   Procedure: CORONARY ANGIOGRAPHY (CATH LAB);  Surgeon: [name], MD;  Location: [facility];  Service: Cardiovascular;  Laterality: Right;   CORONARY STENT INTERVENTION N/A 04/22/2018   Procedure: CORONARY STENT INTERVENTION;  Surgeon: [name], MD;  Location: [facility];  Service: Cardiovascular;  Laterality: N/A;   DILATION AND CURETTAGE OF UTERUS     ESOPHAGEAL MANOMETRY N/A 03/03/2018   Procedure: ESOPHAGEAL MANOMETRY (EM);  Surgeon: Mauri Pole, MD;  Location: [facility];  Service: Endoscopy;  Laterality: N/A;   GASTRIC FUNDOPLICATION     HERNIA REPAIR     HYSTEROSCOPY     fibroids   LAPAROSCOPIC CHOLECYSTECTOMY     LAPAROSCOPY     fibroids   NISSEN  FUNDOPLICATION  6256L   POLYPECTOMY     RIGHT/LEFT HEART CATH AND CORONARY ANGIOGRAPHY N/A 02/20/2018   Procedure: RIGHT/LEFT HEART CATH AND CORONARY ANGIOGRAPHY;  Surgeon: [name], MD;  Location: [facility];  Service: Cardiovascular;  Laterality: N/A;   TEE WITHOUT CARDIOVERSION N/A 07/08/2018   Procedure: TRANSESOPHAGEAL ECHOCARDIOGRAM (TEE);  Surgeon: Burnell Blanks, MD;  Location: [facility];  Service: Open Heart Surgery;  Laterality: N/A;   TEE WITHOUT CARDIOVERSION  10/07/2018   TEE WITHOUT CARDIOVERSION N/A 10/07/2018   Procedure: TRANSESOPHAGEAL ECHOCARDIOGRAM (TEE);  Surgeon: Jerline Pain, MD;  Location[facility] ENDOSCOPY;  Service: Cardiovascular;  Laterality: N/A;   TONSILLECTOMY     TRANSCATHETER AORTIC VALVE REPLACEMENT, TRANSFEMORAL N/A 07/08/2018   Procedure: TRANSCATHETER AORTIC VALVE REPLACEMENT, TRANSFEMORAL;  Surgeon: Burnell Blanks, MD;  Location: [facility];   Service: Open Heart Surgery;  Laterality: N/A;    There were no vitals filed for this visit.   Subjective Assessment - 06/14/21 1154     Subjective  Pt reports going to the neurologist who told her that her hand "would never get better"    Pertinent History DM2, CAD, HTN, aortic stenosis status post TAVR in 3/20, hypothyroidism    Limitations significant arthritis in knees    Patient Stated Goals to be able to get back in to the swing of things    Pain Onset More than a month ago              Theraputty: Engaged in hand strengthening exercises with focus on increased motor control and strength with theraputty.  Pt completed finger flexion, extension, thumb opposition, 3 jaw chuck, and lateral pinch.  Pt required min multimodal cues for proper technique, especially with finger extension and lateral pinch.  Pt removed small stones from putty with tip to tip and 3 jaw chuck pinch to increase challenge with picking up items.  Provided pt with theraputty HEP.                      OT Short Term Goals - 06/09/21 1114       OT SHORT TERM GOAL #1   Title Pt will be independent in [facility] HEP to increase coordination as needed for managing clothing fasteners.    Time 4    Period Weeks    Status On-going    Target Date 06/23/21      OT SHORT TERM GOAL #2   Title Pt will demonstrate improved fine motor coordination for ADLs as evidenced by decreasing 9 hole peg test score for LUE by 10 secs    Baseline R: 35.5 and L: 55.4    Time 4    Period Weeks    Status On-going      OT SHORT TERM GOAL #3   Title Pt will demonstrate increased grip strength in LUE by 5# as needed for opening containers    Baseline R: 43 and L: 30    Time 4    Period Weeks    Status On-going               OT Long Term Goals - 06/09/21 1114       OT LONG TERM GOAL #1   Title Pt will be independent in completing a full body/routine HEP as needed for increased balance and LB flexibility to  increase independence with LB dressing.    Time 8    Period Weeks    Status On-going  Target Date 07/21/21      OT LONG TERM GOAL #2   Title Pt will demonstrate improved fine motor coordination for ADLs as evidenced by decreasing 9 hole peg test score for LUE by 15 secs    Baseline R: 35.5 and L: 55.4    Time 8    Period Weeks    Status On-going      OT LONG TERM GOAL #3   Title Pt will demonstrate improved UE functional use for ADLs as evidenced by increasing box/ blocks score by 5 blocks with LUE.    Baseline R; 41 and L: 31    Time 8    Period Weeks    Status On-going      OT LONG TERM GOAL #4   Title Pt will be independent in energy conservation strategies to increase safety and decrease fall risk.    Time 8    Period Weeks    Status On-going                   Plan - 06/14/21 1211     Clinical Impression Statement Pt asking questions about value of occupational therapy and ability to address her LUE weakness and tremors.  Pt receptive to education regarding strengthening as well as compensatory strategies.  Pt reports wanting improvement and willing to put in the work, but frustrated by tremors and MD informing her that her hand "would not get better".    OT Occupational Profile and History Detailed Assessment- Review of Records and additional review of physical, cognitive, psychosocial history related to current functional performance    Occupational performance deficits (Please refer to evaluation for details): ADL's;IADL's    Body Structure / Function / Physical Skills ADL;Balance;Body mechanics;Coordination;Decreased knowledge of use of DME;Dexterity;Endurance;FMC;GMC;Flexibility;Mobility;Pain;ROM;Strength;UE functional use    Rehab Potential Good    Clinical Decision Making Limited treatment options, no task modification necessary    Comorbidities Affecting Occupational Performance: May have comorbidities impacting occupational performance    Modification or  Assistance to Complete Evaluation  No modification of tasks or assist necessary to complete eval    OT Frequency 2x / week    OT Duration 8 weeks    OT Treatment/Interventions Self-care/ADL training;Aquatic Therapy;Biofeedback;Cryotherapy;Electrical Stimulation;Moist Heat;Ultrasound;Therapeutic exercise;Neuromuscular education;Energy conservation;DME and/or AE instruction;Functional Mobility Training;Manual Therapy;Passive range of motion;Therapeutic activities;Patient/family education;Balance training;Coping strategies training    Plan gross grasp and functional grasp, educ on reducing tremors.  Introduce theraband and other positioning for decreased tremors    Consulted and Agree with Plan of Care Patient             Patient will benefit from skilled therapeutic intervention in order to improve the following deficits and impairments:   Body Structure / Function / Physical Skills: ADL, Balance, Body mechanics, Coordination, Decreased knowledge of use of DME, Dexterity, Endurance, FMC, GMC, Flexibility, Mobility, Pain, ROM, Strength, UE functional use       Visit Diagnosis: Muscle weakness (generalized)  Unsteadiness on feet  Other abnormalities of gait and mobility  Other symptoms and signs involving the musculoskeletal system  Other lack of coordination    Problem List Patient Active Problem List   Diagnosis Date Noted   COVID-19 02/14/2021   Postviral fatigue syndrome 02/14/2021   Difficulty with adaptive servo-ventilation (ASV) use 02/14/2021   Sepsis secondary to UTI ([place]) 09/08/2020   Elevated ALT measurement 09/08/2020   History of CVA with residual deficit 09/08/2020   Hyperbilirubinemia 09/08/2020   Fatigue associated with anemia 08/03/2020  Cerebrovascular accident (CVA) due to embolism of right posterior cerebral artery ([place]) 08/03/2020   OSA treated with BiPAP 08/03/2020   Complex sleep apnea syndrome 08/03/2020   Treatment-emergent central sleep apnea  08/03/2020   Chronic intermittent hypoxia with obstructive sleep apnea 04/21/2020   OSA (obstructive sleep apnea) 04/21/2020   History of cardioembolic stroke 15/94/5859   Gait disturbance, post-stroke 03/29/2020   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 ([place]) 03/29/2020   Anxiety    RLQ abdominal pain 10/22/2019   Paroxysmal atrial fibrillation ([place]) 05/22/2019   Iron deficiency anemia 05/07/2019   Atrial fibrillation with RVR ([place]) 10/21/2018   Cerebellar stroke, acute ([place]) 10/21/2018   Streptococcal endocarditis    Endocarditis of mitral valve 10/07/2018   Bacteremia due to Streptococcus Salivarius 10/07/2018   Sepsis ([place]) 29/24/4628   Acute metabolic encephalopathy 63/81/7711   Severe aortic stenosis 07/08/2018   S/P TAVR (transcatheter aortic valve replacement) 07/08/2018   Esophageal thickening    CAD in native artery 04/22/2018   Gastroesophageal reflux disease    Pulmonary hypertension ([place]) 02/21/2018   Essential hypertension 07/15/2017   History of colonic polyps 05/22/2017   Elevated liver function tests 12/05/2016   DOE (dyspnea on exertion) 07/19/2016   Thalassemia minor 05/29/2016   Left bundle branch block 12/06/2015   Upper airway cough syndrome 65/79/0383   Diastolic heart failure ([place]) 10/10/2015   Myalgia 02/17/2014   Carotid artery stenosis 06/05/2013   Eustachian tube dysfunction 05/07/2013   Neuropathy of leg 03/07/2012   Anemia, unspecified 01/31/2012   Hot flashes 08/24/2010   Diabetes mellitus due to underlying condition, uncontrolled 06/30/2010   Generalized abdominal pain 03/24/2010   Obesity (BMI 30.0-34.9) 12/21/2009   IBS (irritable bowel syndrome) 04/29/2009   Vitamin D deficiency 03/10/2009   Acute bronchitis 03/07/2009   Hypothyroidism 12/13/2008   Dyslipidemia 12/13/2008   Anxiety state 12/13/2008   Other specified disorders of bladder 12/13/2008    Simonne Come, OT 06/14/2021, 12:43 PM  [facility] [address], [place], [place], [address] Phone: [phone]   Fax:  [phone]  Name: PAULETT KAUFHOLD MRN: [id number] Date of Birth: 1945/07/15

## 2021-06-16 ENCOUNTER — Other Ambulatory Visit: Payer: Self-pay | Admitting: Adult Health

## 2021-06-16 ENCOUNTER — Ambulatory Visit: Payer: Medicare Other

## 2021-06-16 ENCOUNTER — Ambulatory Visit: Payer: Medicare Other | Admitting: Occupational Therapy

## 2021-06-16 ENCOUNTER — Other Ambulatory Visit (HOSPITAL_BASED_OUTPATIENT_CLINIC_OR_DEPARTMENT_OTHER): Payer: Self-pay

## 2021-06-16 ENCOUNTER — Other Ambulatory Visit: Payer: Self-pay

## 2021-06-16 ENCOUNTER — Encounter: Payer: Self-pay | Admitting: Occupational Therapy

## 2021-06-16 DIAGNOSIS — R2681 Unsteadiness on feet: Secondary | ICD-10-CM

## 2021-06-16 DIAGNOSIS — R2689 Other abnormalities of gait and mobility: Secondary | ICD-10-CM | POA: Diagnosis not present

## 2021-06-16 DIAGNOSIS — I2510 Atherosclerotic heart disease of native coronary artery without angina pectoris: Secondary | ICD-10-CM

## 2021-06-16 DIAGNOSIS — Z8673 Personal history of transient ischemic attack (TIA), and cerebral infarction without residual deficits: Secondary | ICD-10-CM

## 2021-06-16 DIAGNOSIS — R29898 Other symptoms and signs involving the musculoskeletal system: Secondary | ICD-10-CM

## 2021-06-16 DIAGNOSIS — E785 Hyperlipidemia, unspecified: Secondary | ICD-10-CM

## 2021-06-16 DIAGNOSIS — M6281 Muscle weakness (generalized): Secondary | ICD-10-CM

## 2021-06-16 DIAGNOSIS — R350 Frequency of micturition: Secondary | ICD-10-CM

## 2021-06-16 DIAGNOSIS — I10 Essential (primary) hypertension: Secondary | ICD-10-CM

## 2021-06-16 DIAGNOSIS — I480 Paroxysmal atrial fibrillation: Secondary | ICD-10-CM

## 2021-06-16 DIAGNOSIS — R278 Other lack of coordination: Secondary | ICD-10-CM | POA: Diagnosis not present

## 2021-06-16 DIAGNOSIS — E1169 Type 2 diabetes mellitus with other specified complication: Secondary | ICD-10-CM

## 2021-06-16 MED ORDER — ONETOUCH ULTRA 2 W/DEVICE KIT
PACK | 0 refills | Status: DC
  Filled 2021-06-16: qty 1, 30d supply, fill #0
  Filled 2021-06-26: qty 1, 1d supply, fill #0

## 2021-06-16 NOTE — Patient Instructions (Signed)
Visit Information  Thank you for taking time to visit with me today. Please don't hesitate to contact me if I can be of assistance to you before our next scheduled telephone appointment.  Following are the goals we discussed today:  Take all medications as prescribed Attend all scheduled provider appointments Call pharmacy for medication refills 3-7 days in advance of running out of medications Call provider office for new concerns or questions  keep appointment with eye doctor check blood sugar at prescribed times: twice daily and when you have symptoms of low or high blood sugar fill half of plate with vegetables manage portion size keep feet up while sitting check pulse (heart) rate once a day make a plan to eat healthy take medicine as prescribed check blood pressure daily choose a place to take my blood pressure (home, clinic or office, retail store) call doctor for signs and symptoms of high blood pressure keep all doctor appointments eat more whole grains, fruits and vegetables, lean meats and healthy fats call for medicine refill 2 or 3 days before it runs out take all medications exactly as prescribed call doctor with any symptoms you believe are related to your medicine  Our next appointment is by telephone on 07/20/21 at 10:45 AM  Please call the care guide team at [phone] if you need to cancel or reschedule your appointment.   If you are experiencing a Mental Health or [place] or need someone to talk to, please call the Suicide and Crisis Lifeline: 988 call the [place] National Suicide Prevention Lifeline: 520-202-4100 or TTY: 617-771-8181 TTY 458-605-3887) to talk to a trained counselor call 1-800-273-TALK (toll free, 24 hour hotline) go to [facility] [address], [place] [id number]) call 911   Patient verbalizes understanding of instructions and care plan provided today and agrees to view in [place].  Active MyChart status confirmed with patient.   Peter Garter RN, Jackquline Denmark, CDE Care Management Coordinator [facility] [phone]

## 2021-06-16 NOTE — Therapy (Signed)
[facility] [address], [place], [place], [address] Phone: [phone]   Fax:  [phone]  Occupational Therapy Treatment  Patient Details  Name: Breanna Webster MRN: [id number] Date of Birth: [birth year] Referring Provider (OT): Dohmeier, Asencion Partridge, MD   Encounter Date: 06/16/2021   OT End of Session - 06/16/21 0941     Visit Number 4    Number of Visits 17    Date for OT Re-Evaluation 07/21/21    Authorization Type Medicare A and B    Authorization - Visit Number 4    Authorization - Number of Visits 17    Progress Note Due on Visit 10    OT Start Time 0933    OT Stop Time 1015    OT Time Calculation (min) 42 min    Activity Tolerance Patient tolerated treatment well    Behavior During Therapy [facility] for tasks assessed/performed             Past Medical History:  Diagnosis Date   Anxiety    Arthritis    "back" (04/22/2018)   Back pain    Blood transfusion without reported diagnosis    CAD (coronary artery disease)    a. 03/2018 s/p PCI/DES to the RCA (3.0x15 Onyx DES).   Carotid artery stenosis    Mild   Chest pain    Chronic lower back pain    Cirrhosis (HCC)    Colon polyps    Diverticulitis    Diverticulosis    Esophageal thickening    seen on pre TAVR CT scan, also questionable cirrhosis. MRI recommended. Will refer to GI   Fatty liver    GERD (gastroesophageal reflux disease)    Grave's disease    History of colonic polyps 05/22/2017   History of hiatal hernia    Hypertension    Hypothyroidism    IBS (irritable bowel syndrome)    Osteopenia    Pulmonary nodules    seen on pre TAVR CT. likley benign. no follow up recommended if pt low risk.   S/P TAVR (transcatheter aortic valve replacement)    Severe aortic stenosis    Shortness of breath on exertion    Stroke ([place])    Thalassemia minor    Thyroid disease    Type II diabetes mellitus ([place])     Past Surgical History:  Procedure Laterality Date    104 HOUR [place] STUDY N/A 03/03/2018   Procedure: 24 HOUR PH STUDY;  Surgeon: Mauri Pole, MD;  Location: [facility];  Service: Endoscopy;  Laterality: N/A;   AORTIC VALVE REPLACEMENT  06/2018   COLONOSCOPY     COLONOSCOPY W/ BIOPSIES AND POLYPECTOMY     CORONARY ANGIOGRAPHY Right 04/21/2018   Procedure: CORONARY ANGIOGRAPHY (CATH LAB);  Surgeon: [name], MD;  Location: [place] CV LAB;  Service: Cardiovascular;  Laterality: Right;   CORONARY STENT INTERVENTION N/A 04/22/2018   Procedure: CORONARY STENT INTERVENTION;  Surgeon: [name], MD;  Location: [place] CV LAB;  Service: Cardiovascular;  Laterality: N/A;   DILATION AND CURETTAGE OF UTERUS     ESOPHAGEAL MANOMETRY N/A 03/03/2018   Procedure: ESOPHAGEAL MANOMETRY (EM);  Surgeon: Mauri Pole, MD;  Location: [facility];  Service: Endoscopy;  Laterality: N/A;   GASTRIC FUNDOPLICATION     HERNIA REPAIR     HYSTEROSCOPY     fibroids   LAPAROSCOPIC CHOLECYSTECTOMY     LAPAROSCOPY     fibroids   NISSEN  FUNDOPLICATION  8850Y   POLYPECTOMY     RIGHT/LEFT HEART CATH AND CORONARY ANGIOGRAPHY N/A 02/20/2018   Procedure: RIGHT/LEFT HEART CATH AND CORONARY ANGIOGRAPHY;  Surgeon: [name], MD;  Location: [place] CV LAB;  Service: Cardiovascular;  Laterality: N/A;   TEE WITHOUT CARDIOVERSION N/A 07/08/2018   Procedure: TRANSESOPHAGEAL ECHOCARDIOGRAM (TEE);  Surgeon: Burnell Blanks, MD;  Location: [place];  Service: Open Heart Surgery;  Laterality: N/A;   TEE WITHOUT CARDIOVERSION  10/07/2018   TEE WITHOUT CARDIOVERSION N/A 10/07/2018   Procedure: TRANSESOPHAGEAL ECHOCARDIOGRAM (TEE);  Surgeon: Jerline Pain, MD;  Location: [facility] ENDOSCOPY;  Service: Cardiovascular;  Laterality: N/A;   TONSILLECTOMY     TRANSCATHETER AORTIC VALVE REPLACEMENT, TRANSFEMORAL N/A 07/08/2018   Procedure: TRANSCATHETER AORTIC VALVE REPLACEMENT, TRANSFEMORAL;  Surgeon: Burnell Blanks, MD;  Location: [place];   Service: Open Heart Surgery;  Laterality: N/A;    There were no vitals filed for this visit.   Subjective Assessment - 06/16/21 0940     Subjective  Pt reports continued difficulty carrying bowl or plate in L hand while ambulating due to tremors.    Pertinent History DM2, CAD, HTN, aortic stenosis status post TAVR in 3/20, hypothyroidism    Limitations significant arthritis in knees    Patient Stated Goals to be able to get back in to the swing of things    Currently in Pain? No/denies    Pain Onset More than a month ago               Theraband from OT tool kit with focus on LUE strengthening.  Pt completed 1 set of 10 bilaterally elbow flexion/extension, shoulder extension/abduction/flexion, overhead press, horizontal abduction, diagonal up/down with red theraband.  Therapist providing cues for symmetry as pt continues to demonstrate LUE weakness compared to RUE.  Pt requiring min to mod multimodal cues for proper technique.  Provided pt with HEP of each exercise and encouraged to complete 3x/week as able. Therapist educating on strengthening and postural control as needed for LUE weakness as well as tremors. Pt demonstrating increased tremors in L hand at rest after exercise.                       OT Short Term Goals - 06/09/21 1114       OT SHORT TERM GOAL #1   Title Pt will be independent in [facility] HEP to increase coordination as needed for managing clothing fasteners.    Time 4    Period Weeks    Status On-going    Target Date 06/23/21      OT SHORT TERM GOAL #2   Title Pt will demonstrate improved fine motor coordination for ADLs as evidenced by decreasing 9 hole peg test score for LUE by 10 secs    Baseline R: 35.5 and L: 55.4    Time 4    Period Weeks    Status On-going      OT SHORT TERM GOAL #3   Title Pt will demonstrate increased grip strength in LUE by 5# as needed for opening containers    Baseline R: 43 and L: 30    Time 4    Period Weeks     Status On-going               OT Long Term Goals - 06/09/21 1114       OT LONG TERM GOAL #1   Title Pt will be independent in completing  a full body/routine HEP as needed for increased balance and LB flexibility to increase independence with LB dressing.    Time 8    Period Weeks    Status On-going    Target Date 07/21/21      OT LONG TERM GOAL #2   Title Pt will demonstrate improved fine motor coordination for ADLs as evidenced by decreasing 9 hole peg test score for LUE by 15 secs    Baseline R: 35.5 and L: 55.4    Time 8    Period Weeks    Status On-going      OT LONG TERM GOAL #3   Title Pt will demonstrate improved UE functional use for ADLs as evidenced by increasing box/ blocks score by 5 blocks with LUE.    Baseline R; 41 and L: 31    Time 8    Period Weeks    Status On-going      OT LONG TERM GOAL #4   Title Pt will be independent in energy conservation strategies to increase safety and decrease fall risk.    Time 8    Period Weeks    Status On-going                   Plan - 06/16/21 3354     Clinical Impression Statement Pt demonstrating decreased strength in LUE compared to R during theraband exercises.  Pt benefting from min multimodal cues for technique and cues to focus on symmetry to address LUE weakness during bilateral exercises.    OT Occupational Profile and History Detailed Assessment- Review of Records and additional review of physical, cognitive, psychosocial history related to current functional performance    Occupational performance deficits (Please refer to evaluation for details): ADL's;IADL's    Body Structure / Function / Physical Skills ADL;Balance;Body mechanics;Coordination;Decreased knowledge of use of DME;Dexterity;Endurance;FMC;GMC;Flexibility;Mobility;Pain;ROM;Strength;UE functional use    Rehab Potential Good    Clinical Decision Making Limited treatment options, no task modification necessary    Comorbidities Affecting  Occupational Performance: May have comorbidities impacting occupational performance    Modification or Assistance to Complete Evaluation  No modification of tasks or assist necessary to complete eval    OT Frequency 2x / week    OT Duration 8 weeks    OT Treatment/Interventions Self-care/ADL training;Aquatic Therapy;Biofeedback;Cryotherapy;Electrical Stimulation;Moist Heat;Ultrasound;Therapeutic exercise;Neuromuscular education;Energy conservation;DME and/or AE instruction;Functional Mobility Training;Manual Therapy;Passive range of motion;Therapeutic activities;Patient/family education;Balance training;Coping strategies training    Plan gross grasp and functional grasp, carrying bowl/dish with L hand (tremors), educ on positioning for reducing tremors    OT Home Exercise Plan OT toolkit UB theraband exercises    Consulted and Agree with Plan of Care Patient             Patient will benefit from skilled therapeutic intervention in order to improve the following deficits and impairments:   Body Structure / Function / Physical Skills: ADL, Balance, Body mechanics, Coordination, Decreased knowledge of use of DME, Dexterity, Endurance, FMC, GMC, Flexibility, Mobility, Pain, ROM, Strength, UE functional use       Visit Diagnosis: Muscle weakness (generalized)  Unsteadiness on feet  Other abnormalities of gait and mobility  Other symptoms and signs involving the musculoskeletal system  Other lack of coordination    Problem List Patient Active Problem List   Diagnosis Date Noted   COVID-19 02/14/2021   Postviral fatigue syndrome 02/14/2021   Difficulty with adaptive servo-ventilation (ASV) use 02/14/2021   Sepsis secondary to UTI ([place]) 09/08/2020   Elevated  ALT measurement 09/08/2020   History of CVA with residual deficit 09/08/2020   Hyperbilirubinemia 09/08/2020   Fatigue associated with anemia 08/03/2020   Cerebrovascular accident (CVA) due to embolism of right posterior  cerebral artery ([place]) 08/03/2020   OSA treated with BiPAP 08/03/2020   Complex sleep apnea syndrome 08/03/2020   Treatment-emergent central sleep apnea 08/03/2020   Chronic intermittent hypoxia with obstructive sleep apnea 04/21/2020   OSA (obstructive sleep apnea) 04/21/2020   History of cardioembolic stroke 50/53/9767   Gait disturbance, post-stroke 03/29/2020   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 ([place]) 03/29/2020   Anxiety    RLQ abdominal pain 10/22/2019   Paroxysmal atrial fibrillation ([place]) 05/22/2019   Iron deficiency anemia 05/07/2019   Atrial fibrillation with RVR ([place]) 10/21/2018   Cerebellar stroke, acute ([place]) 10/21/2018   Streptococcal endocarditis    Endocarditis of mitral valve 10/07/2018   Bacteremia due to Streptococcus Salivarius 10/07/2018   Sepsis ([place]) 34/19/3790   Acute metabolic encephalopathy 24/12/7351   Severe aortic stenosis 07/08/2018   S/P TAVR (transcatheter aortic valve replacement) 07/08/2018   Esophageal thickening    CAD in native artery 04/22/2018   Gastroesophageal reflux disease    Pulmonary hypertension ([place]) 02/21/2018   Essential hypertension 07/15/2017   History of colonic polyps 05/22/2017   Elevated liver function tests 12/05/2016   DOE (dyspnea on exertion) 07/19/2016   Thalassemia minor 05/29/2016   Left bundle branch block 12/06/2015   Upper airway cough syndrome 29/92/4268   Diastolic heart failure ([place]) 10/10/2015   Myalgia 02/17/2014   Carotid artery stenosis 06/05/2013   Eustachian tube dysfunction 05/07/2013   Neuropathy of leg 03/07/2012   Anemia, unspecified 01/31/2012   Hot flashes 08/24/2010   Diabetes mellitus due to underlying condition, uncontrolled 06/30/2010   Generalized abdominal pain 03/24/2010   Obesity (BMI 30.0-34.9) 12/21/2009   IBS (irritable bowel syndrome) 04/29/2009   Vitamin D deficiency 03/10/2009   Acute bronchitis 03/07/2009   Hypothyroidism 12/13/2008   Dyslipidemia 12/13/2008    Anxiety state 12/13/2008   Other specified disorders of bladder 12/13/2008    Simonne Come, OT 06/16/2021, 11:25 AM  [facility] [address] [place], [place], [address] Phone: [phone]   Fax:  [phone]  Name: ROMANA DEATON MRN: [id number] Date of Birth: Sep 21, 1945

## 2021-06-16 NOTE — Chronic Care Management (AMB) (Signed)
Chronic Care Management   CCM RN Visit Note  06/16/2021 Name: Breanna Webster MRN: [id number] DOB: October 18, 1945  Subjective: Breanna Webster is a 76 y.o. year old female who is a primary care patient of [name], NP. The care management team was consulted for assistance with disease management and care coordination needs.    Engaged with patient by telephone for follow up visit in response to provider referral for case management and/or care coordination services.   Consent to Services:  The patient was given information about Chronic Care Management services, agreed to services, and gave verbal consent prior to initiation of services.  Please see initial visit note for detailed documentation.   Patient agreed to services and verbal consent obtained.   Assessment: Review of patient past medical history, allergies, medications, health status, including review of consultants reports, laboratory and other test data, was performed as part of comprehensive evaluation and provision of chronic care management services.   SDOH (Social Determinants of Health) assessments and interventions performed:    CCM Care Plan  Allergies  Allergen Reactions   Statins Other (See Comments)    Muscle aches    Outpatient Encounter Medications as of 06/16/2021  Medication Sig   amoxicillin-clavulanate (AUGMENTIN) 875-125 MG tablet Take 1 tablet by mouth twice daily for 10 days   apixaban (ELIQUIS) 5 MG TABS tablet TAKE ONE TABLET BY MOUTH TWICE A DAY   b complex vitamins capsule Take 1 capsule by mouth daily.   BD PEN NEEDLE NANO 2ND GEN 32G X 4 MM MISC    cholecalciferol (VITAMIN D) 25 MCG (1000 UNIT) tablet Take 1,000 Units by mouth daily.   diltiazem (CARDIZEM CD) 120 MG 24 hr capsule Take 1 capsule (120 mg total) by mouth daily.   escitalopram (LEXAPRO) 20 MG tablet TAKE ONE TABLET BY MOUTH DAILY   estradiol (ESTRACE) 0.1 MG/GM vaginal cream Place vaginally.   estradiol (ESTRACE) 0.1 MG/GM  vaginal cream Place 1 applicatorful into the vagina 2 nights per week   Evolocumab (REPATHA SURECLICK) 818 MG/ML SOAJ Inject 1 dose into the skin every 14 (fourteen) days.   famotidine (PEPCID) 20 MG tablet Take 1 tablet by mouth every night at bedtime   glucose blood test strip Use to check blood sugar 3 times daily   Insulin Pen Needle 32G X 4 MM MISC Use to inject 4 times daily   Insulin Syringe-Needle U-100 (ULTICARE INSULIN SYRINGE) 31G X 5/16" 1 ML MISC FOUR TIMES A DAY   Insulin Syringe-Needle U-100 31G X 5/16" 1 ML MISC Use for injections 4 times daily   Lancets (ONETOUCH DELICA PLUS [id number]) MISC USE TO CHECK BLOOD SUGAR TWICE DAILY.Marland Kitchen   Lancets (ONETOUCH DELICA PLUS [id number]) MISC Use 1 lancet 2 times daily   Lancets (ONETOUCH DELICA PLUS [id number]) MISC Use 1 lancet 2 times daily   levothyroxine (SYNTHROID) 125 MCG tablet Take 1 tablet by mouth every morning on an empty stomach   levothyroxine (SYNTHROID) 137 MCG tablet Take 137 mcg by mouth once.   levothyroxine (SYNTHROID) 137 MCG tablet Take 1 tablet by mouth every morning on an empty stomach   losartan (COZAAR) 50 MG tablet TAKE ONE TABLET BY MOUTH DAILY   metFORMIN (GLUCOPHAGE-XR) 500 MG 24 hr tablet Take 500 mg by mouth 2 (two) times daily.   metFORMIN (GLUCOPHAGE-XR) 500 MG 24 hr tablet Take 1 tablet by mouth 2 times daily   mirabegron ER (MYRBETRIQ) 50 MG TB24 tablet Take 1 tablet by mouth once daily.  mirabegron ER (MYRBETRIQ) 50 MG TB24 tablet Take 50 mg by mouth daily.   Multiple Vitamin (MULITIVITAMIN WITH MINERALS) TABS Take 1 tablet by mouth daily.   nitroGLYCERIN (NITROSTAT) 0.4 MG SL tablet Place 1 tablet (0.4 mg total) under the tongue every 5 (five) minutes as needed for chest pain.   NOVOLIN N 100 UNIT/ML injection Inject 28 Units into the skin daily before breakfast.   NOVOLIN R 100 UNIT/ML injection 8 Units 2 (two) times daily before a meal. 10 UNITS THIRTY MINUTES BEFORE MEALS.  5 ADDITIONAL UNITS WITH CARBS  OR SNACKS. (04/26/2021:  Pt reports she only takes the 8 units 2x/daily before meals, not the 10 units)   omeprazole (PRILOSEC) 20 MG capsule Take 1 capsule (20 mg total) by mouth 2 (two) times daily before a meal.   omeprazole (PRILOSEC) 20 MG capsule Take 1 capsule by mouth twice daily before meals   OneTouch Delica Lancets 03E MISC Tests BS 2 times   ONETOUCH VERIO test strip USE TO MONITOR GLUCOSE LEVELS TWICE DAILY   No facility-administered encounter medications on file as of 06/16/2021.    Patient Active Problem List   Diagnosis Date Noted   COVID-19 02/14/2021   Postviral fatigue syndrome 02/14/2021   Difficulty with adaptive servo-ventilation (ASV) use 02/14/2021   Sepsis secondary to UTI (Uncertain) 09/08/2020   Elevated ALT measurement 09/08/2020   History of CVA with residual deficit 09/08/2020   Hyperbilirubinemia 09/08/2020   Fatigue associated with anemia 08/03/2020   Cerebrovascular accident (CVA) due to embolism of right posterior cerebral artery ([place]) 08/03/2020   OSA treated with BiPAP 08/03/2020   Complex sleep apnea syndrome 08/03/2020   Treatment-emergent central sleep apnea 08/03/2020   Chronic intermittent hypoxia with obstructive sleep apnea 04/21/2020   OSA (obstructive sleep apnea) 04/21/2020   History of cardioembolic stroke 01/13/3006   Gait disturbance, post-stroke 03/29/2020   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 ([place]) 03/29/2020   Anxiety    RLQ abdominal pain 10/22/2019   Paroxysmal atrial fibrillation ([place]) 05/22/2019   Iron deficiency anemia 05/07/2019   Atrial fibrillation with RVR ([place]) 10/21/2018   Cerebellar stroke, acute ([place]) 10/21/2018   Streptococcal endocarditis    Endocarditis of mitral valve 10/07/2018   Bacteremia due to Streptococcus Salivarius 10/07/2018   Sepsis ([place]) 62/26/3335   Acute metabolic encephalopathy 45/62/5638   Severe aortic stenosis 07/08/2018   S/P TAVR (transcatheter aortic valve replacement) 07/08/2018    Esophageal thickening    CAD in native artery 04/22/2018   Gastroesophageal reflux disease    Pulmonary hypertension ([place]) 02/21/2018   Essential hypertension 07/15/2017   History of colonic polyps 05/22/2017   Elevated liver function tests 12/05/2016   DOE (dyspnea on exertion) 07/19/2016   Thalassemia minor 05/29/2016   Left bundle branch block 12/06/2015   Upper airway cough syndrome 93/73/4287   Diastolic heart failure ([place]) 10/10/2015   Myalgia 02/17/2014   Carotid artery stenosis 06/05/2013   Eustachian tube dysfunction 05/07/2013   Neuropathy of leg 03/07/2012   Anemia, unspecified 01/31/2012   Hot flashes 08/24/2010   Diabetes mellitus due to underlying condition, uncontrolled 06/30/2010   Generalized abdominal pain 03/24/2010   Obesity (BMI 30.0-34.9) 12/21/2009   IBS (irritable bowel syndrome) 04/29/2009   Vitamin D deficiency 03/10/2009   Acute bronchitis 03/07/2009   Hypothyroidism 12/13/2008   Dyslipidemia 12/13/2008   Anxiety state 12/13/2008   Other specified disorders of bladder 12/13/2008    Conditions to be addressed/monitored:Atrial Fibrillation, CAD, HTN, HLD, DMII, and hx  CVA  Care Plan : RN Care Manager Plan of Care  Updates made by [name], RN since 06/16/2021 12:00 AM     Problem: Chronic Disease Management and Care Coordination Needs (DM2, CAD, Afib,HTN. Hx CVA )   Priority: High     Long-Range Goal: Establish Plan of Care for Chronic Disease Management Needs (DM2, CAD, Afib,HTN. Hx CVA )   Start Date: 03/09/2021  Expected End Date: 09/05/2021  Recent Progress: On track  Priority: High  Note:   Current Barriers:  Care Coordination needs related to Financial constraints related to cost of medications Chronic Disease Management support and education needs related to Atrial Fibrillation, CAD, HTN, DMII, GERD, Overactive Bladder, and hx CVA Pt states she is going to neuro PT and water therapy. States she is feeling stronger and now can  walk with a cane.  States her goal is to go on a trip with her family in the Spring.  Denies any recent falls. States her energy level is getting better.  States she is sleeping OK.  States she does have to get up once a night to urinate.  Denies any chest pains but does get winded with exertion.  States she broke her glucometer and she has not been able to get Dr Jackquline Bosch to send in order as she is out of the office. States her CBGs had been AM ranges of 102-154 and PM 64-209.  States she has only had one lower reading of 64 when she did not wake up from her nap soon enough.  States she has not been checking her B/P at home as she has checked at her doctors appointments RNCM Clinical Goal(s):  Patient will verbalize understanding of plan for management of Atrial Fibrillation, CAD, HTN, DMII, GERD, Overactive Bladder, and hx CVA as evidenced by voiced adherence to plan of care verbalize basic understanding of  Atrial Fibrillation, CAD, HTN, DMII, GERD, Overactive Bladder, and hx CVA disease process and self health management plan as evidenced by voiced understanding and teach back take all medications exactly as prescribed and will call provider for medication related questions as evidenced by dispense report and pt verbalization attend all scheduled medical appointments:  neuro PT 06/20/21,Neurology 06/22/21, Hematology 09/06/21, CCM PharmD 11/06/21 as evidenced by medical records demonstrate Improved adherence to prescribed treatment plan for Atrial Fibrillation, CAD, HTN, DMII, GERD, Overactive Bladder, and hx CVA as evidenced by readings within limits, voiced adherence to plan of care continue to work with RN Care Manager to address care management and care coordination needs related to  Atrial Fibrillation, CAD, HTN, DMII, GERD, Overactive Bladder, and hx CVA as evidenced by adherence to CM Team Scheduled appointments work with pharmacist to address Financial constraints related to cost of Myrbetriq and  Eliquis related toAtrial Fibrillation, CAD, HTN, DMII, GERD, Overactive Bladder, and hx CVA as evidenced by review or EMR and patient or pharmacist report through collaboration with RN Care manager, provider, and care team.   Interventions: 1:1 collaboration with primary care provider regarding development and update of comprehensive plan of care as evidenced by provider attestation and co-signature Inter-disciplinary care team collaboration (see longitudinal plan of care) Evaluation of current treatment plan related to  self management and patient's adherence to plan as established by provider  Falls Interventions:  (Status:  Goal on track:  Yes.) Long Term Goal Advised patient of importance of notifying provider of falls Assessed for signs and symptoms of orthostatic hypotension Assessed for falls since last encounter Assessed patients  knowledge of fall risk prevention secondary to previously provided education Reviewed to continue to do exercises PT is teaching her to get stronger.  Reviewed to use cane at all times     AFIB Interventions: (Status:  Goal on track:  Yes.) Long Term Goal   Counseled on increased risk of stroke due to Afib and benefits of anticoagulation for stroke prevention Reviewed importance of adherence to anticoagulant exactly as prescribed Counseled on bleeding risk associated with Eliquis and importance of self-monitoring for signs/symptoms of bleeding Counseled on seeking medical attention after a head injury or if there is blood in the urine/stool Afib action plan reviewed   CAD Interventions: (Status:  Goal on track:  Yes.) Long Term Goal Assessed understanding of CAD diagnosis Medications reviewed including medications utilized in CAD treatment plan Provided education on importance of blood pressure control in management of CAD Provided education on Importance of limiting foods high in cholesterol Reviewed Importance of taking all medications as  prescribed Advised to report any changes in symptoms or exercise tolerance   Diabetes Interventions:  (Status:  Goal on track:  Yes.) Long Term Goal Assessed patient's understanding of A1c goal: <7% Provided education to patient about basic DM disease process Reviewed medications with patient and discussed importance of medication adherence Counseled on importance of regular laboratory monitoring as prescribed Discussed plans with patient for ongoing care management follow up and provided patient with direct contact information for care management team Advised patient, providing education and rationale, to check cbg twice a day and record, calling provider for findings outside established parameters Communicated with provider of pt's need for new glucometer. Reinforced to try to eat lunch or small snack before taking her nap to help prevent hypoglycemia Lab Results  Component Value Date   HGBA1C 7.4 (H) 09/08/2020   Hyperlipidemia Interventions:  (Status:  Goal on track:  Yes.) Long Term Goal Medication review performed; medication list updated in electronic medical record.  Provider established cholesterol goals reviewed Counseled on importance of regular laboratory monitoring as prescribed Reviewed role and benefits of statin for ASCVD risk reduction Reviewed importance of limiting foods high in cholesterol Reviewed exercise goals and target of 150 minutes per week  Hypertension Interventions:  (Status:  Goal on track:  Yes.) Long Term Goal Last practice recorded BP readings:  BP Readings from Last 3 Encounters:  04/27/21 118/60  04/26/21 (!) 116/58  04/14/21 116/70  Most recent eGFR/CrCl: No results found for: EGFR  No components found for: CRCL  Evaluation of current treatment plan related to hypertension self management and patient's adherence to plan as established by provider Provided education to patient re: stroke prevention, s/s of heart attack and stroke Advised patient,  providing education and rationale, to monitor blood pressure daily and record, calling PCP for findings outside established parameters Provided education on prescribed diet low sodium low CHO heart healthy Discussed complications of poorly controlled blood pressure such as heart disease, stroke, circulatory complications, vision complications, kidney impairment, sexual dysfunction  Stroke:  (Status:Goal on track:  Yes.) Long Term Goal Reviewed Importance of taking all medications as prescribed Advised to report any changes in symptoms or exercise tolerance Assessed for management of bladder and/or bowel incontinence Reinforced to continue PT to help with balance and strength    Urinary frequency Goal on track:  Yes. Long Term Goal Evaluation of current treatment plan related to Overactive Bladder, Financial constraints related to cost of Myrbetric  self-management and patient's adherence to plan as established by provider.  Discussed plans with patient for ongoing care management follow up and provided patient with direct contact information for care management team Evaluation of current treatment plan related to Urinary frequency and patient's adherence to plan as established by provider Discussed plans with patient for ongoing care management follow up and provided patient with direct contact information for care management team Reinforced to limit fluids later in the evening to help decrease night time trips to the bathroom   Patient Goals/Self-Care Activities: Take all medications as prescribed Attend all scheduled provider appointments Call pharmacy for medication refills 3-7 days in advance of running out of medications Call provider office for new concerns or questions  keep appointment with eye doctor check blood sugar at prescribed times: twice daily and when you have symptoms of low or high blood sugar fill half of plate with vegetables manage portion size keep feet up while  sitting check pulse (heart) rate once a day make a plan to eat healthy take medicine as prescribed check blood pressure daily choose a place to take my blood pressure (home, clinic or office, retail store) call doctor for signs and symptoms of high blood pressure keep all doctor appointments eat more whole grains, fruits and vegetables, lean meats and healthy fats call for medicine refill 2 or 3 days before it runs out take all medications exactly as prescribed call doctor with any symptoms you believe are related to your medicine Follow Up Plan:  Telephone follow up appointment with care management team member scheduled for:  07/20/21 The patient has been provided with contact information for the care management team and has been advised to call with any health related questions or concerns.         Plan:Telephone follow up appointment with care management team member scheduled for:  07/20/21 The patient has been provided with contact information for the care management team and has been advised to call with any health related questions or concerns.  Peter Garter RN, Jackquline Denmark, CDE Care Management Coordinator [facility] [phone]

## 2021-06-19 ENCOUNTER — Ambulatory Visit: Payer: BLUE CROSS/BLUE SHIELD | Admitting: Internal Medicine

## 2021-06-19 DIAGNOSIS — H04123 Dry eye syndrome of bilateral lacrimal glands: Secondary | ICD-10-CM | POA: Diagnosis not present

## 2021-06-19 DIAGNOSIS — H2513 Age-related nuclear cataract, bilateral: Secondary | ICD-10-CM | POA: Diagnosis not present

## 2021-06-19 DIAGNOSIS — H34212 Partial retinal artery occlusion, left eye: Secondary | ICD-10-CM | POA: Diagnosis not present

## 2021-06-20 ENCOUNTER — Encounter: Payer: Self-pay | Admitting: Occupational Therapy

## 2021-06-20 ENCOUNTER — Ambulatory Visit: Payer: Medicare Other | Admitting: Occupational Therapy

## 2021-06-20 ENCOUNTER — Telehealth: Payer: Self-pay

## 2021-06-20 ENCOUNTER — Encounter: Payer: Self-pay | Admitting: Physical Therapy

## 2021-06-20 ENCOUNTER — Other Ambulatory Visit: Payer: Self-pay

## 2021-06-20 ENCOUNTER — Other Ambulatory Visit (HOSPITAL_BASED_OUTPATIENT_CLINIC_OR_DEPARTMENT_OTHER): Payer: Self-pay

## 2021-06-20 ENCOUNTER — Ambulatory Visit: Payer: Medicare Other | Admitting: Physical Therapy

## 2021-06-20 DIAGNOSIS — R29898 Other symptoms and signs involving the musculoskeletal system: Secondary | ICD-10-CM

## 2021-06-20 DIAGNOSIS — R2689 Other abnormalities of gait and mobility: Secondary | ICD-10-CM | POA: Diagnosis not present

## 2021-06-20 DIAGNOSIS — M6281 Muscle weakness (generalized): Secondary | ICD-10-CM

## 2021-06-20 DIAGNOSIS — I10 Essential (primary) hypertension: Secondary | ICD-10-CM | POA: Diagnosis not present

## 2021-06-20 DIAGNOSIS — I2510 Atherosclerotic heart disease of native coronary artery without angina pectoris: Secondary | ICD-10-CM | POA: Diagnosis not present

## 2021-06-20 DIAGNOSIS — Z794 Long term (current) use of insulin: Secondary | ICD-10-CM

## 2021-06-20 DIAGNOSIS — E1169 Type 2 diabetes mellitus with other specified complication: Secondary | ICD-10-CM

## 2021-06-20 DIAGNOSIS — R278 Other lack of coordination: Secondary | ICD-10-CM

## 2021-06-20 DIAGNOSIS — I480 Paroxysmal atrial fibrillation: Secondary | ICD-10-CM

## 2021-06-20 DIAGNOSIS — E785 Hyperlipidemia, unspecified: Secondary | ICD-10-CM

## 2021-06-20 DIAGNOSIS — R2681 Unsteadiness on feet: Secondary | ICD-10-CM | POA: Diagnosis not present

## 2021-06-20 NOTE — Telephone Encounter (Signed)
Called and lmom the pt to call back to schedule fasting labs this is my second attempt.

## 2021-06-20 NOTE — Telephone Encounter (Signed)
-----   Message from Ramond Dial, [place] sent at 06/20/2021  3:48 PM EST -----  ----- Message ----- From: Ramond Dial, RPH-CPP Sent: 06/20/2021  12:00 AM EST To: Ramond Dial, RPH-CPP  Set up lipids

## 2021-06-20 NOTE — Therapy (Signed)
[facility] [address] [place], [place], [address] Phone: [phone]   Fax:  [phone]  Physical Therapy Treatment  Patient Details  Name: Breanna Webster MRN: [id number] Date of Birth: Jun 29, 1945 Referring Provider (PT): Dohmeier, Asencion Partridge   Encounter Date: 06/20/2021   PT End of Session - 06/20/21 1246     Visit Number 14    Number of Visits 23    Date for PT Re-Evaluation 07/18/21    Authorization Type Medicare/Generic Commercial    Progress Note Due on Visit 20    PT Start Time 1016    PT Stop Time 1057    PT Time Calculation (min) 41 min    Equipment Utilized During Treatment Gait belt   large yellow noodle, small bar bells   Activity Tolerance Patient tolerated treatment well    Behavior During Therapy WFL for tasks assessed/performed             Past Medical History:  Diagnosis Date   Anxiety    Arthritis    "back" (04/22/2018)   Back pain    Blood transfusion without reported diagnosis    CAD (coronary artery disease)    a. 03/2018 s/p PCI/DES to the RCA (3.0x15 Onyx DES).   Carotid artery stenosis    Mild   Chest pain    Chronic lower back pain    Cirrhosis (HCC)    Colon polyps    Diverticulitis    Diverticulosis    Esophageal thickening    seen on pre TAVR CT scan, also questionable cirrhosis. MRI recommended. Will refer to GI   Fatty liver    GERD (gastroesophageal reflux disease)    Grave's disease    History of colonic polyps 05/22/2017   History of hiatal hernia    Hypertension    Hypothyroidism    IBS (irritable bowel syndrome)    Osteopenia    Pulmonary nodules    seen on pre TAVR CT. likley benign. no follow up recommended if pt low risk.   S/P TAVR (transcatheter aortic valve replacement)    Severe aortic stenosis    Shortness of breath on exertion    Stroke ([place])    Thalassemia minor    Thyroid disease    Type II diabetes mellitus ([place])     Past Surgical History:   Procedure Laterality Date   75 HOUR [place] STUDY N/A 03/03/2018   Procedure: 24 HOUR PH STUDY;  Surgeon: Mauri Pole, MD;  Location: [facility];  Service: Endoscopy;  Laterality: N/A;   AORTIC VALVE REPLACEMENT  06/2018   COLONOSCOPY     COLONOSCOPY W/ BIOPSIES AND POLYPECTOMY     CORONARY ANGIOGRAPHY Right 04/21/2018   Procedure: CORONARY ANGIOGRAPHY (CATH LAB);  Surgeon: [name], MD;  Location: [facility];  Service: Cardiovascular;  Laterality: Right;   CORONARY STENT INTERVENTION N/A 04/22/2018   Procedure: CORONARY STENT INTERVENTION;  Surgeon: [name], MD;  Location: [facility];  Service: Cardiovascular;  Laterality: N/A;   DILATION AND CURETTAGE OF UTERUS     ESOPHAGEAL MANOMETRY N/A 03/03/2018   Procedure: ESOPHAGEAL MANOMETRY (EM);  Surgeon: Mauri Pole, MD;  Location: [facility];  Service: Endoscopy;  Laterality: N/A;   GASTRIC FUNDOPLICATION     HERNIA REPAIR     HYSTEROSCOPY     fibroids   LAPAROSCOPIC CHOLECYSTECTOMY     LAPAROSCOPY     fibroids   NISSEN FUNDOPLICATION  2263F  POLYPECTOMY     RIGHT/LEFT HEART CATH AND CORONARY ANGIOGRAPHY N/A 02/20/2018   Procedure: RIGHT/LEFT HEART CATH AND CORONARY ANGIOGRAPHY;  Surgeon: [name], MD;  Location: [facility];  Service: Cardiovascular;  Laterality: N/A;   TEE WITHOUT CARDIOVERSION N/A 07/08/2018   Procedure: TRANSESOPHAGEAL ECHOCARDIOGRAM (TEE);  Surgeon: Burnell Blanks, MD;  Location: [facility];  Service: Open Heart Surgery;  Laterality: N/A;   TEE WITHOUT CARDIOVERSION  10/07/2018   TEE WITHOUT CARDIOVERSION N/A 10/07/2018   Procedure: TRANSESOPHAGEAL ECHOCARDIOGRAM (TEE);  Surgeon: Jerline Pain, MD;  Location: [facility] ENDOSCOPY;  Service: Cardiovascular;  Laterality: N/A;   TONSILLECTOMY     TRANSCATHETER AORTIC VALVE REPLACEMENT, TRANSFEMORAL N/A 07/08/2018   Procedure: TRANSCATHETER AORTIC VALVE REPLACEMENT, TRANSFEMORAL;  Surgeon: Burnell Blanks,  MD;  Location: [facility];  Service: Open Heart Surgery;  Laterality: N/A;    There were no vitals filed for this visit.   Subjective Assessment - 06/20/21 1014     Subjective Did not have a good week- having trouble with her eyes and continued arhtritis. Reports that HEP is still a good challenge for her.    Pertinent History significant arthritis in knees    Patient Stated Goals Pt's goals for therapy are to get better with walking and with arthritis.    Currently in Pain? Yes    Pain Score 8     Pain Location Knee    Pain Orientation Right;Left    Pain Descriptors / Indicators Aching    Pain Type Chronic pain                               [facility] Adult PT Treatment/Exercise - 06/20/21 0001       Neuro Re-ed    Neuro Re-ed Details  rolling ball under foot CW/CCW 10x each direction, each foot with B UE support; walking on toes/heels with 1 UE support along TM rail, tandem walk along TM rail with 1 UE support 4x   cues to maintain chest up and slow speed     Knee/Hip Exercises: Stretches   Passive Hamstring Stretch Right;Left;1 rep;30 seconds    Passive Hamstring Stretch Limitations supine with strap    Hip Flexor Stretch Right;Left;30 seconds;2 reps    Hip Flexor Stretch Limitations mod thomas with gentle PT OP    Gastroc Stretch Right;Left;30 seconds;1 rep    Gastroc Stretch Limitations runner's stretch      Knee/Hip Exercises: Aerobic   Recumbent Bike L1 x 6 min (LEs)      Knee/Hip Exercises: Standing   Forward Step Up Left;1 set;Hand Hold: 1;Step Height: 6";5 reps    Forward Step Up Limitations holding ball in B hands; limited by pain      Knee/Hip Exercises: Supine   Bridges Strengthening;Both;2 sets;10 reps    Bridges Limitations straight leg bridge on red pball    Other Supine Knee/Hip Exercises deadbug 10x   heavy cueing for coordination                      PT Short Term Goals - 06/13/21 1029       PT SHORT TERM GOAL #1   Title Pt  will be independent with HEP for improved strength and flexibility, balance, transfers, and gait.   TARGET 05/26/2021    Time 4    Period Weeks    Status Achieved      PT SHORT TERM  GOAL #2   Title Pt will improve Berg score to at least 41/56 to decrease fall risk.    Baseline 36/56    Time 4    Period Weeks    Status Achieved      PT SHORT TERM GOAL #3   Title Pt will improve TUG score to less than or equal to 21 sec for decreased fall risk.    Baseline 26.88 sec    Time 4    Period Weeks    Status Achieved      PT SHORT TERM GOAL #4   Title Pt will verbalize understanding of fall prevention in home environment.    Time 4    Period Weeks    Status Achieved               PT Long Term Goals - 06/13/21 1029       PT LONG TERM GOAL #1   Title Pt will be independent with HEP for improved flexibility and strength, balance, transfers, and gait.  TARGET 06/23/2021    Time 5    Period Weeks    Status Partially Met   met for current   Target Date 07/18/21      PT LONG TERM GOAL #2   Title Pt will improve Berg score to at least 46/56 to decrease fall risk.    Time 5    Period Weeks    Status On-going   44/56   Target Date 07/18/21      PT LONG TERM GOAL #3   Title Pt will improve TUG score to less than or equal to 16 sec for decreased fall risk.    Time 5    Period Weeks    Status Partially Met   16.31 sec   Target Date 07/18/21      PT LONG TERM GOAL #4   Title Pt will improve gait velocity to at least 2 ft/sec for improved gait efficiency and safety.    Baseline 1.73 ft/sec    Time 9    Period Weeks    Status Achieved   2.69 ft/s     PT LONG TERM GOAL #5   Title Pt will verbalize plans for continued community fitness upon d/c to maximize gains made in PT.    Time 9    Period Weeks    Status Achieved   reports plans to join gym and walk at [employer] - 06/20/21 1246     Clinical Impression Statement Patient arrived to  session with report of increased knee pain today. Ambulated into session with much improved AD sequencing and step length with patients cane, however she still reports sensation of instability when using this device. Worked on dynamic balance activities with focus on narrow BOS and SLS. Performed step ups without UE support with good stability; patient limited by pain thus focused on the hemiparetic L LE. End of session focused on gentle LE stretching and core strengthening d/t c/o R knee pain throughout standing balance activities.    Personal Factors and Comorbidities Comorbidity 3+    Comorbidities CVA due to embolism R PCA; COVID -19, post-viral fatigue syndrome  PMH COVID 11/2020, arthritis, back pain, CAD, cirrhosis, diverticulitis/osis, GERD, HTN, IBS, osteopenia, PAF    Examination-Activity Limitations Locomotion Level;Transfers;Stand;Caring for Others;Dressing    Examination-Participation Restrictions Meal Prep;Cleaning;Community Activity;Laundry    Stability/Clinical Decision Making Evolving/Moderate complexity  Rehab Potential Good    PT Frequency 2x / week    PT Duration Other (comment)   5 weeks   PT Treatment/Interventions ADLs/Self Care Home Management;Aquatic Therapy;DME Instruction;Neuromuscular re-education;Balance training;Therapeutic exercise;Therapeutic activities;Functional mobility training;Gait training;Patient/family education;Manual techniques    PT Next Visit Plan Work on standing balance, gait activities.  Gait training with cane outdoor surfaces; work on timing/coordination with LLE hip/knee flexion with gait.    Consulted and Agree with Plan of Care Patient             Patient will benefit from skilled therapeutic intervention in order to improve the following deficits and impairments:  Abnormal gait, Difficulty walking, Impaired flexibility, Decreased balance, Decreased mobility, Decreased strength, Postural dysfunction, Pain  Visit Diagnosis: Muscle weakness  (generalized)  Unsteadiness on feet  Other abnormalities of gait and mobility  Other symptoms and signs involving the musculoskeletal system     Problem List Patient Active Problem List   Diagnosis Date Noted   COVID-19 02/14/2021   Postviral fatigue syndrome 02/14/2021   Difficulty with adaptive servo-ventilation (ASV) use 02/14/2021   Sepsis secondary to UTI ([place]) 09/08/2020   Elevated ALT measurement 09/08/2020   History of CVA with residual deficit 09/08/2020   Hyperbilirubinemia 09/08/2020   Fatigue associated with anemia 08/03/2020   Cerebrovascular accident (CVA) due to embolism of right posterior cerebral artery ([place]) 08/03/2020   OSA treated with BiPAP 08/03/2020   Complex sleep apnea syndrome 08/03/2020   Treatment-emergent central sleep apnea 08/03/2020   Chronic intermittent hypoxia with obstructive sleep apnea 04/21/2020   OSA (obstructive sleep apnea) 04/21/2020   History of cardioembolic stroke 14/43/1540   Gait disturbance, post-stroke 03/29/2020   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 ([place]) 03/29/2020   Anxiety    RLQ abdominal pain 10/22/2019   Paroxysmal atrial fibrillation ([place]) 05/22/2019   Iron deficiency anemia 05/07/2019   Atrial fibrillation with RVR ([place]) 10/21/2018   Cerebellar stroke, acute ([place]) 10/21/2018   Streptococcal endocarditis    Endocarditis of mitral valve 10/07/2018   Bacteremia due to Streptococcus Salivarius 10/07/2018   Sepsis ([place]) 08/67/6195   Acute metabolic encephalopathy 09/32/6712   Severe aortic stenosis 07/08/2018   S/P TAVR (transcatheter aortic valve replacement) 07/08/2018   Esophageal thickening    CAD in native artery 04/22/2018   Gastroesophageal reflux disease    Pulmonary hypertension ([place]) 02/21/2018   Essential hypertension 07/15/2017   History of colonic polyps 05/22/2017   Elevated liver function tests 12/05/2016   DOE (dyspnea on exertion) 07/19/2016   Thalassemia minor 05/29/2016   Left  bundle branch block 12/06/2015   Upper airway cough syndrome 45/80/9983   Diastolic heart failure ([place]) 10/10/2015   Myalgia 02/17/2014   Carotid artery stenosis 06/05/2013   Eustachian tube dysfunction 05/07/2013   Neuropathy of leg 03/07/2012   Anemia, unspecified 01/31/2012   Hot flashes 08/24/2010   Diabetes mellitus due to underlying condition, uncontrolled 06/30/2010   Generalized abdominal pain 03/24/2010   Obesity (BMI 30.0-34.9) 12/21/2009   IBS (irritable bowel syndrome) 04/29/2009   Vitamin D deficiency 03/10/2009   Acute bronchitis 03/07/2009   Hypothyroidism 12/13/2008   Dyslipidemia 12/13/2008   Anxiety state 12/13/2008   Other specified disorders of bladder 12/13/2008    Janene Harvey, PT, DPT 06/20/21 12:48 PM   [facility] [address], [place], [place], [address] Phone: [phone]   Fax:  [phone]  Name: Breanna Webster MRN: [id number] Date of Birth: 1945/05/31

## 2021-06-20 NOTE — Therapy (Signed)
[facility] [address] [place], [place], [address] Phone: [phone]   Fax:  [phone]  Occupational Therapy Treatment  Patient Details  Name: Breanna Webster MRN: [id number] Date of Birth: 12-Feb-1946 Referring Provider (OT): Dohmeier, Asencion Partridge, MD   Encounter Date: 06/20/2021   OT End of Session - 06/20/21 1104     Visit Number 5    Number of Visits 17    Date for OT Re-Evaluation 07/21/21    Authorization Type Medicare A and B    Authorization - Visit Number 5    Authorization - Number of Visits 17    Progress Note Due on Visit 10    OT Start Time 1101    OT Stop Time 1144    OT Time Calculation (min) 43 min    Activity Tolerance Patient tolerated treatment well    Behavior During Therapy [facility] for tasks assessed/performed             Past Medical History:  Diagnosis Date   Anxiety    Arthritis    "back" (04/22/2018)   Back pain    Blood transfusion without reported diagnosis    CAD (coronary artery disease)    a. 03/2018 s/p PCI/DES to the RCA (3.0x15 Onyx DES).   Carotid artery stenosis    Mild   Chest pain    Chronic lower back pain    Cirrhosis (HCC)    Colon polyps    Diverticulitis    Diverticulosis    Esophageal thickening    seen on pre TAVR CT scan, also questionable cirrhosis. MRI recommended. Will refer to GI   Fatty liver    GERD (gastroesophageal reflux disease)    Grave's disease    History of colonic polyps 05/22/2017   History of hiatal hernia    Hypertension    Hypothyroidism    IBS (irritable bowel syndrome)    Osteopenia    Pulmonary nodules    seen on pre TAVR CT. likley benign. no follow up recommended if pt low risk.   S/P TAVR (transcatheter aortic valve replacement)    Severe aortic stenosis    Shortness of breath on exertion    Stroke ([place])    Thalassemia minor    Thyroid disease    Type II diabetes mellitus ([place])     Past Surgical History:  Procedure Laterality Date    33 HOUR [place] STUDY N/A 03/03/2018   Procedure: 24 HOUR PH STUDY;  Surgeon: Mauri Pole, MD;  Location: [facility];  Service: Endoscopy;  Laterality: N/A;   AORTIC VALVE REPLACEMENT  06/2018   COLONOSCOPY     COLONOSCOPY W/ BIOPSIES AND POLYPECTOMY     CORONARY ANGIOGRAPHY Right 04/21/2018   Procedure: CORONARY ANGIOGRAPHY (CATH LAB);  Surgeon: [name], MD;  Location: [place] CV LAB;  Service: Cardiovascular;  Laterality: Right;   CORONARY STENT INTERVENTION N/A 04/22/2018   Procedure: CORONARY STENT INTERVENTION;  Surgeon: [name], MD;  Location: [place] CV LAB;  Service: Cardiovascular;  Laterality: N/A;   DILATION AND CURETTAGE OF UTERUS     ESOPHAGEAL MANOMETRY N/A 03/03/2018   Procedure: ESOPHAGEAL MANOMETRY (EM);  Surgeon: Mauri Pole, MD;  Location: [facility];  Service: Endoscopy;  Laterality: N/A;   GASTRIC FUNDOPLICATION     HERNIA REPAIR     HYSTEROSCOPY     fibroids   LAPAROSCOPIC CHOLECYSTECTOMY     LAPAROSCOPY     fibroids   NISSEN  FUNDOPLICATION  1610R   POLYPECTOMY     RIGHT/LEFT HEART CATH AND CORONARY ANGIOGRAPHY N/A 02/20/2018   Procedure: RIGHT/LEFT HEART CATH AND CORONARY ANGIOGRAPHY;  Surgeon: [name], MD;  Location: [place] CV LAB;  Service: Cardiovascular;  Laterality: N/A;   TEE WITHOUT CARDIOVERSION N/A 07/08/2018   Procedure: TRANSESOPHAGEAL ECHOCARDIOGRAM (TEE);  Surgeon: Burnell Blanks, MD;  Location: [facility];  Service: Open Heart Surgery;  Laterality: N/A;   TEE WITHOUT CARDIOVERSION  10/07/2018   TEE WITHOUT CARDIOVERSION N/A 10/07/2018   Procedure: TRANSESOPHAGEAL ECHOCARDIOGRAM (TEE);  Surgeon: Jerline Pain, MD;  Location: [facility] ENDOSCOPY;  Service: Cardiovascular;  Laterality: N/A;   TONSILLECTOMY     TRANSCATHETER AORTIC VALVE REPLACEMENT, TRANSFEMORAL N/A 07/08/2018   Procedure: TRANSCATHETER AORTIC VALVE REPLACEMENT, TRANSFEMORAL;  Surgeon: Burnell Blanks, MD;  Location: [facility];   Service: Open Heart Surgery;  Laterality: N/A;    There were no vitals filed for this visit.   Subjective Assessment - 06/20/21 1101     Subjective  Pt reports not having a good week, reporting pain in leg and more difficulty with balancing.    Pertinent History DM2, CAD, HTN, aortic stenosis status post TAVR in 3/20, hypothyroidism    Limitations significant arthritis in knees    Patient Stated Goals to be able to get back in to the swing of things    Currently in Pain? Yes    Pain Score 8     Pain Location Knee    Pain Orientation Right;Left    Pain Descriptors / Indicators Aching    Pain Type Chronic pain    Pain Onset More than a month ago             Engaged in functional reach with simulated setting of table with focus on use of LUE for strengthening while educating on reducing action tremors.  Pt continues to demonstrate tremors despite keeping elbow in by side, WB through elbow on table top, and even attempting addition of wrist weight during carrying of dishes.  Pt did respond for brief periods of time to WB through elbow during simulated grooming tasks, as pt reports typically holding mirror in L hand.    Educated on adaptive techniques to reduce tremors, providing pt with handout from OT toolkit.  Pt plans to attempt above addressed techniques at home during functional tasks.    Discussed continued therapy focus on LUE weakness s/p CVA primarily with plan to incorporate further modifications for tremors.                       OT Short Term Goals - 06/09/21 1114       OT SHORT TERM GOAL #1   Title Pt will be independent in [facility] HEP to increase coordination as needed for managing clothing fasteners.    Time 4    Period Weeks    Status On-going    Target Date 06/23/21      OT SHORT TERM GOAL #2   Title Pt will demonstrate improved fine motor coordination for ADLs as evidenced by decreasing 9 hole peg test score for LUE by 10 secs    Baseline R:  35.5 and L: 55.4    Time 4    Period Weeks    Status On-going      OT SHORT TERM GOAL #3   Title Pt will demonstrate increased grip strength in LUE by 5# as needed for opening containers    Baseline  R: 43 and L: 30    Time 4    Period Weeks    Status On-going               OT Long Term Goals - 06/09/21 1114       OT LONG TERM GOAL #1   Title Pt will be independent in completing a full body/routine HEP as needed for increased balance and LB flexibility to increase independence with LB dressing.    Time 8    Period Weeks    Status On-going    Target Date 07/21/21      OT LONG TERM GOAL #2   Title Pt will demonstrate improved fine motor coordination for ADLs as evidenced by decreasing 9 hole peg test score for LUE by 15 secs    Baseline R: 35.5 and L: 55.4    Time 8    Period Weeks    Status On-going      OT LONG TERM GOAL #3   Title Pt will demonstrate improved UE functional use for ADLs as evidenced by increasing box/ blocks score by 5 blocks with LUE.    Baseline R; 41 and L: 31    Time 8    Period Weeks    Status On-going      OT LONG TERM GOAL #4   Title Pt will be independent in energy conservation strategies to increase safety and decrease fall risk.    Time 8    Period Weeks    Status On-going                   Plan - 06/20/21 1104     Clinical Impression Statement Pt continues to be frustrated by tremor in LUE.  Pt receptive to modifications to hand placement and WB through elbow on table or bracing against body, however pt continues to demonstrate tremors in L hand despite modifications.  Pt continues to demonstrate LUE weakness in addition to tremors, therefore will continue to focus on LUE strengthening in addition.    OT Occupational Profile and History Detailed Assessment- Review of Records and additional review of physical, cognitive, psychosocial history related to current functional performance    Occupational performance deficits (Please  refer to evaluation for details): ADL's;IADL's    Body Structure / Function / Physical Skills ADL;Balance;Body mechanics;Coordination;Decreased knowledge of use of DME;Dexterity;Endurance;FMC;GMC;Flexibility;Mobility;Pain;ROM;Strength;UE functional use    Rehab Potential Good    Clinical Decision Making Limited treatment options, no task modification necessary    Comorbidities Affecting Occupational Performance: May have comorbidities impacting occupational performance    Modification or Assistance to Complete Evaluation  No modification of tasks or assist necessary to complete eval    OT Frequency 2x / week    OT Duration 8 weeks    OT Treatment/Interventions Self-care/ADL training;Aquatic Therapy;Biofeedback;Cryotherapy;Electrical Stimulation;Moist Heat;Ultrasound;Therapeutic exercise;Neuromuscular education;Energy conservation;DME and/or AE instruction;Functional Mobility Training;Manual Therapy;Passive range of motion;Therapeutic activities;Patient/family education;Balance training;Coping strategies training    Plan LUE strengthening and endurance, gross grasp and functional grasp, carrying bowl/dish with L hand (tremors)    OT Home Exercise Plan OT toolkit UB theraband exercises    Consulted and Agree with Plan of Care Patient             Patient will benefit from skilled therapeutic intervention in order to improve the following deficits and impairments:   Body Structure / Function / Physical Skills: ADL, Balance, Body mechanics, Coordination, Decreased knowledge of use of DME, Dexterity, Endurance, FMC, GMC, Flexibility, Mobility, Pain, ROM,  Strength, UE functional use       Visit Diagnosis: Muscle weakness (generalized)  Unsteadiness on feet  Other lack of coordination    Problem List Patient Active Problem List   Diagnosis Date Noted   COVID-19 02/14/2021   Postviral fatigue syndrome 02/14/2021   Difficulty with adaptive servo-ventilation (ASV) use 02/14/2021   Sepsis  secondary to UTI ([place]) 09/08/2020   Elevated ALT measurement 09/08/2020   History of CVA with residual deficit 09/08/2020   Hyperbilirubinemia 09/08/2020   Fatigue associated with anemia 08/03/2020   Cerebrovascular accident (CVA) due to embolism of right posterior cerebral artery ([place]) 08/03/2020   OSA treated with BiPAP 08/03/2020   Complex sleep apnea syndrome 08/03/2020   Treatment-emergent central sleep apnea 08/03/2020   Chronic intermittent hypoxia with obstructive sleep apnea 04/21/2020   OSA (obstructive sleep apnea) 04/21/2020   History of cardioembolic stroke 52/11/221   Gait disturbance, post-stroke 03/29/2020   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 ([place]) 03/29/2020   Anxiety    RLQ abdominal pain 10/22/2019   Paroxysmal atrial fibrillation ([place]) 05/22/2019   Iron deficiency anemia 05/07/2019   Atrial fibrillation with RVR ([place]) 10/21/2018   Cerebellar stroke, acute ([place]) 10/21/2018   Streptococcal endocarditis    Endocarditis of mitral valve 10/07/2018   Bacteremia due to Streptococcus [place] 10/07/2018   Sepsis ([place]) 36/03/2448   Acute metabolic encephalopathy 75/30/0511   Severe aortic stenosis 07/08/2018   S/P TAVR (transcatheter aortic valve replacement) 07/08/2018   Esophageal thickening    CAD in native artery 04/22/2018   Gastroesophageal reflux disease    Pulmonary hypertension ([place]) 02/21/2018   Essential hypertension 07/15/2017   History of colonic polyps 05/22/2017   Elevated liver function tests 12/05/2016   DOE (dyspnea on exertion) 07/19/2016   Thalassemia minor 05/29/2016   Left bundle branch block 12/06/2015   Upper airway cough syndrome 06/03/1733   Diastolic heart failure ([place]) 10/10/2015   Myalgia 02/17/2014   Carotid artery stenosis 06/05/2013   Eustachian tube dysfunction 05/07/2013   Neuropathy of leg 03/07/2012   Anemia, unspecified 01/31/2012   Hot flashes 08/24/2010   Diabetes mellitus due to underlying condition,  uncontrolled 06/30/2010   Generalized abdominal pain 03/24/2010   Obesity (BMI 30.0-34.9) 12/21/2009   IBS (irritable bowel syndrome) 04/29/2009   Vitamin D deficiency 03/10/2009   Acute bronchitis 03/07/2009   Hypothyroidism 12/13/2008   Dyslipidemia 12/13/2008   Anxiety state 12/13/2008   Other specified disorders of bladder 12/13/2008    Simonne Come, OT 06/20/2021, 12:00 PM  [facility] [address], [place], [place], [address] Phone: [phone]   Fax:  [phone]  Name: Breanna Webster MRN: [id number] Date of Birth: 02/21/1946

## 2021-06-20 NOTE — Telephone Encounter (Signed)
Called and LVM. Looks like this appt was accidentally r/s back on 2/15. Based on last note per casey on 2/14 this appt was supposed to have been cx and r/s with either Janett Billow, NP or Dr. Leonie Man if stroke related. Please refer to last note (2/14).

## 2021-06-21 ENCOUNTER — Ambulatory Visit (INDEPENDENT_AMBULATORY_CARE_PROVIDER_SITE_OTHER): Payer: Medicare Other | Admitting: Neurology

## 2021-06-21 ENCOUNTER — Encounter: Payer: Self-pay | Admitting: Neurology

## 2021-06-21 ENCOUNTER — Other Ambulatory Visit (HOSPITAL_BASED_OUTPATIENT_CLINIC_OR_DEPARTMENT_OTHER): Payer: Self-pay

## 2021-06-21 VITALS — BP 146/73 | HR 80 | Ht 65.0 in | Wt 181.5 lb

## 2021-06-21 DIAGNOSIS — G20 Parkinson's disease: Secondary | ICD-10-CM

## 2021-06-21 DIAGNOSIS — G8194 Hemiplegia, unspecified affecting left nondominant side: Secondary | ICD-10-CM | POA: Diagnosis not present

## 2021-06-21 DIAGNOSIS — G63 Polyneuropathy in diseases classified elsewhere: Secondary | ICD-10-CM

## 2021-06-21 DIAGNOSIS — D563 Thalassemia minor: Secondary | ICD-10-CM | POA: Diagnosis not present

## 2021-06-21 DIAGNOSIS — I5030 Unspecified diastolic (congestive) heart failure: Secondary | ICD-10-CM

## 2021-06-21 DIAGNOSIS — I25118 Atherosclerotic heart disease of native coronary artery with other forms of angina pectoris: Secondary | ICD-10-CM

## 2021-06-21 DIAGNOSIS — D588 Other specified hereditary hemolytic anemias: Secondary | ICD-10-CM | POA: Diagnosis not present

## 2021-06-21 DIAGNOSIS — E889 Metabolic disorder, unspecified: Secondary | ICD-10-CM | POA: Diagnosis not present

## 2021-06-21 DIAGNOSIS — E0865 Diabetes mellitus due to underlying condition with hyperglycemia: Secondary | ICD-10-CM | POA: Diagnosis not present

## 2021-06-21 DIAGNOSIS — I480 Paroxysmal atrial fibrillation: Secondary | ICD-10-CM

## 2021-06-21 MED ORDER — CARBIDOPA-LEVODOPA 25-100 MG PO TABS
1.0000 | ORAL_TABLET | Freq: Two times a day (BID) | ORAL | 2 refills | Status: DC
  Filled 2021-06-21: qty 60, 30d supply, fill #0

## 2021-06-21 NOTE — Patient Instructions (Addendum)
Carbidopa; Levodopa Tablets What is this medication? CARBIDOPA; LEVODOPA (kar bi DOE pa; lee voe DOE pa) treats the symptoms of Parkinson disease. It works by increasing the amount of dopamine in your brain, a substance which helps manage body movements and coordination. This reduces the symptoms of Parkinson, such as body stiffness and tremors. This medicine may be used for other purposes; ask your health care provider or pharmacist if you have questions. COMMON BRAND NAME(S): Atamet, Dhivy, SINEMET What should I tell my care team before I take this medication? They need to know if you have any of these conditions: Depression or other mental illness Diabetes Glaucoma Heart disease, including history of a heart attack History of irregular heartbeat Kidney disease Liver disease Lung or breathing disease, like asthma Narcolepsy Sleep apnea Stomach or intestine problems An unusual or allergic reaction to levodopa, carbidopa, other medications, foods, dyes, or preservatives Pregnant or trying to get pregnant Breast-feeding How should I use this medication? Take this medication by mouth with a glass of water. Follow the directions on the prescription label. Take your doses at regular intervals. Do not take your medication more often than directed. Do not stop taking except on the advice of your care team. One form of this medication is a breakable tablet. If your care team has prescribed this, this medication comes with INSTRUCTIONS FOR USE. Ask your pharmacist for directions on how to use this medicine. Read the information carefully. Talk to your pharmacist or care team if you have questions. Talk to your care team about the use of this medication in children. Special care may be needed. Overdosage: If you think you have taken too much of this medicine contact a poison control center or emergency room at once. NOTE: This medicine is only for you. Do not share this medicine with others. What  if I miss a dose? If you miss a dose, take it as soon as you can. If it is almost time for your next dose, take only that dose. Do not take double or extra doses. What may interact with this medication? Do not take this medication with any of the following: MAOIs like Marplan, Nardil, and Parnate Reserpine Tetrabenazine This medication may also interact with the following: Alcohol Droperidol Entacapone Iron supplements or multivitamins with iron Isoniazid, INH Linezolid Medications for depression, anxiety, or psychotic disturbances Medications for high blood pressure Medications for sleep Metoclopramide Papaverine Procarbazine Tedizolid Rasagiline Selegiline Tolcapone This list may not describe all possible interactions. Give your health care provider a list of all the medicines, herbs, non-prescription drugs, or dietary supplements you use. Also tell them if you smoke, drink alcohol, or use illegal drugs. Some items may interact with your medicine. What should I watch for while using this medication? Visit your care team for regular checks on your progress. Tell your care team if your symptoms do not start to get better or if they get worse. Do not stop taking except on your care team's advice. You may develop a severe reaction. Your care team will tell you how much medication to take. You may experience a wearing of effect prior to the time for your next dose of this medication. You may also experience an on-off effect where the medication apparently stops working for anything from a minute to several hours, then suddenly starts working again. Tell your care team if any of these symptoms happen to you. Your dose may need to be changed. A high protein diet can slow or prevent absorption  of this medication. Avoid high protein foods near the time of taking this medication to help to prevent these problems. Take this medication at least 30 minutes before eating or one hour after meals. You  may want to eat higher protein foods later in the day or in small amounts. Discuss your diet with your care team. You may get drowsy or dizzy. Do not drive, use machinery, or do anything that needs mental alertness until you know how this medication affects you. Do not stand or sit up quickly, especially if you are an older patient. This reduces the risk of dizzy or fainting spells. Alcohol may interfere with the effect of this medication. Avoid alcoholic drinks. When taking this medication, you may fall asleep without notice. You may be doing activities like driving a car, talking, or eating. You may not feel drowsy before it happens. Contact your care team right away if this happens to you. There have been reports of increased sexual urges or other strong urges such as gambling while taking this medication. If you experience any of these while taking this medication, you should report this to your care team as soon as possible. If you are diabetic, this medication may interfere with the accuracy of some tests for sugar or ketones in the urine (does not interfere with blood tests). Check with your care team before changing the dose of your diabetic medication. This medication may discolor the urine or sweat, making it look darker or red in color. This is of no cause for concern. However, this may stain clothing or fabrics. This medication may cause a decrease in vitamin B6. You should make sure that you get enough vitamin B6 while you are taking this medication. Discuss the foods you eat and the vitamins you take with your care team. What side effects may I notice from receiving this medication? Side effects that you should report to your care team as soon as possible: Allergic reactions--skin rash, itching, hives, swelling of the face, lips, tongue, or throat Falling asleep during daily activities Heart rhythm changes--fast or irregular heartbeat, dizziness, feeling faint or lightheaded, chest pain,  trouble breathing Low blood pressure--dizziness, feeling faint or lightheaded, blurry vision Mood and behavior changes--anxiety, nervousness, confusion, hallucinations, irritability, hostility, thoughts of suicide or self-harm, worsening mood, feelings of depression New or worsening uncontrolled and repetitive movements of the face, mouth, or upper body Stomach bleeding--bloody or black, tar-like stools, vomiting blood or brown material that looks like coffee grounds Sudden eye pain or change in vision such as blurry vision, seeing halos around lights, vision loss Urges to engage in impulsive behaviors such as gambling, binge eating, sexual activity, or shopping in ways that are unusual for you Side effects that usually do not require medical attention (report to your care team if they continue or are bothersome): Dark red or black saliva, sweat, or urine Dizziness Drowsiness Headache Nausea This list may not describe all possible side effects. Call your doctor for medical advice about side effects. You may report side effects to FDA at 1-800-FDA-1088. Where should I keep my medication? Keep out of the reach of children. Store at room temperature between 15 and 30 degrees C (59 and 86 degrees F). Protect from light. Throw away any unused medication after the expiration date. NOTE: This sheet is a summary. It may not cover all possible information. If you have questions about this medicine, talk to your doctor, pharmacist, or health care provider.  2022 Elsevier/Gold Standard (2020-10-31 00:00:00)  Tremor A tremor is trembling or shaking that you cannot control. Most tremors affect the hands or arms. Tremors can also affect the head, vocal cords, face, and other parts of the body. There are many types of tremors. Common types include: Essential tremor. These usually occur in people older than 40. It may run in families and can happen in otherwise healthy people. Resting tremor. These occur  when the muscles are at rest, such as when your hands are resting in your lap. People with Parkinson's disease often have resting tremors. Postural tremor. These occur when you try to hold a pose, such as keeping your hands outstretched. Kinetic tremor. These occur during purposeful movement, such as trying to touch a finger to your nose. Task-specific tremor. These may occur when you perform certain tasks such as writing, speaking, or standing. Psychogenic tremor. These dramatically lessen or disappear when you are distracted. They can happen in people of all ages. Some types of tremors have no known cause. Tremors can also be a symptom of nervous system problems (neurological disorders) that may occur with aging. Some tremors go away with treatment, while others do not. Follow these instructions at home: Lifestyle   Limit alcohol intake to no more than 1 drink a day for nonpregnant women and 2 drinks a day for men. One drink equals 12 oz of beer, 5 oz of wine, or 1 oz of hard liquor. Do not use any products that contain nicotine or tobacco, such as cigarettes and e-cigarettes. If you need help quitting, ask your health care provider. Avoid extreme heat and extreme cold. Limit your caffeine intake, as told by your health care provider. Try to get 8 hours of sleep each night. Find ways to manage your stress, such as meditation or yoga. General instructions Take over-the-counter and prescription medicines only as told by your health care provider. Keep all follow-up visits as told by your health care provider. This is important. Contact a health care provider if you: Develop a tremor after starting a new medicine. Have a tremor along with other symptoms such as: Numbness. Tingling. Pain. Weakness. Notice that your tremor gets worse. Notice that your tremor interferes with your day-to-day life. Summary A tremor is trembling or shaking that you cannot control. Most tremors affect the hands  or arms. Some types of tremors have no known cause. Others may be a symptom of nervous system problems (neurological disorders). Make sure you discuss any tremors you have with your health care provider. This information is not intended to replace advice given to you by your health care provider. Make sure you discuss any questions you have with your health care provider. Document Revised: 12/30/2019 Document Reviewed: 01/01/2020 Elsevier Patient Education  2022 [employer].

## 2021-06-21 NOTE — Progress Notes (Signed)
[facility] [address] [place]. [place] [address] [phone]       OFFICE FOLLOW UP NOTE  [name] Date of Birth:  24-Aug-1945 Medical Record Number:  [id number]   Reason for Referral: stroke follow up    CHIEF COMPLAINT:  Chief Complaint  Patient presents with   Follow-up    Rm 10, w husband. Pt c/o double vision and loss of vision. Pt was seen by her eye doctor yesterday and states it could be SE from previous stroke.      HPI:   Rv: 06-21-2021: Rv with Breanna Seat, MD  Breanna Webster is a 76 y.o. year old female presented with increased shortness of breath and confusion x2 weeks along with headache on 10/21/2018 with recent admission on 10/03/2018 for sepsis and mitral valve endocarditis.  Stroke work-up revealed multifocal bilateral anterior and posterior infarcts cardioembolic pattern secondary to new onset A. fib versus recent endocarditis. Vascular risk factors include history of DVT, aortic stenosis status post TAVR 06/2018, pulmonary hypertension, HTN, DM, CAD status post stenting 03/2018, endocarditis, and A. Fib on Eliquis. Prior dx of sleep apnea but on recent sleep study no evidence of sleep apnea She has seen ophthalmologist, Dr. Katy Fitch, and he felt strongly that her vision changes were due to stroke.  She is on Eloquis. Transient right eye vision loss, attributed to CVA, atrial fibrillation. She never felt any symptoms such as palpitations. Amaurosis fugax, 2 minutes, resolved- diplopia resolved within seconds of onset.   Plans on following up with Dr. Katy Fitch for further evaluation including visual field testing.  Left hand tremor, worsening: this was present since prior stroke. Its larger amplitude. It's left side only- and looks very parkinsonian.  Cerebral angiogram was discussed with Dr.Groat. Breanna Webster is her cardiologist.     Update 04/13/2021 Breanna Webster- Stroke  Patient returns for acute visit due to increased falls and  multiple other concerns.  Falls:  ED eval 12/14 post fall with c/o left flank pain and HA. CTH no acute finding. Lab work showed stable chronic anemia and hyperglycemia with improvement after D50. Fall occurred after "tripping over her feet" per husband. Per ED note, left orbital and left flank bruising. CT abdomen unremarkable. Reports fall 2 weeks ago where she lost her balance -felt like she was being pulled towards the left.  Denies associated dizziness at that time but does have chronic intermittent dizziness. Imbalance present since her stroke. Does admit to limited physical activity or exercise due to fear of falling - she reports she keeps busy going to different doctors appointments. She does report chronic b/l knee and lower back pain.  Chronic imbalance from stroke 2.5 years ago. She is concerned that this has worsened over the past 2 weeks and very concerned about possible new stroke. Denies any increased stressors or new medications.   Left Lower Breast Pain:  complaining of sharp left breast pain which started after fall last week. It is constant but worse with coughing, sneezing, lying down and with deep breaths. Radiates into back and right midline anterior. CT abdomen obtained at ED visit which was unremarkable. She is trying to schedule an appt with her cardiologist. Has appt with PCP tomorrow morning.   Transient right eye vision loss:  chronic issue. Prior extensive work-up unremarkable. Plans on following up with Dr. Katy Fitch for further evaluation Left hand tremor, worsening: this was present since prior stroke. Feels like this is worsening as it is "moving up my  arm" where previously only in hand. Does not interfere with daily activity or functioning.    She reports compliance with Eliquis 5 mg twice daily. Plans on starting Repatha due to continued elevated LDL despite statin usage.  Both Eliquis and Repatha management cardiology. Followed by PCP for DM management - prior A1c 7.4.  noted hypoglycemia during recent ED visit - denies this occurring at home. No further concerns at this time      History provided for reference purposes only Update 03/14/2021 AL: she called yesterday reporting a 5-15 minute episode of severe dizziness. Symptoms resolved spontaneously. She reports having a similar event 2-3 weeks ago of sudden dizziness. She reports not being able to stand up due to dizziness. She states that she was dry heaving. She was very nauseous. Symptoms lasted about 5 minutes. She also reports having "black out of right eye vision" that lasts 20 seconds twice over the past 2-3 weeks. Not associated with dizziness or headache. She has had a visual disturbance (husband states she has not mentioned this to him) a couple of times over the past few days where she sees a zig zag line in the right eye.  Again, no headache or dizziness associated with visual disturbance. She had a similar event of right sided vision loss in 03/2020. CTA head and neck unremarkable for acute process. Right paratracheal nodule noted. She has seen optometry but not ophthalmologist. She does not drink much water due to overactive bladder. She drinks coffee in the mornings and usually tea at night.    She continues Eliquis, diltiazem, losartan. BP was 148/71 at beginning of visit, repeated manually 15 minutes later and was 140/60. Orthostatics 125/74 P 77 lying, 119/66 P 78 with standing and patient reported dizziness, 115/62 P 83 standing (less dizzy) and 115/58 P 80 after three minutes standing (less dizzy). She reports allergy to statin therapy. She has follow up echo with cardiology in 2 weeks. She was started on abx and steroids yesterday following visit with pulmonology for productive cough. BP was 112/72 at that visit.    Update 06/22/2020 JM: [name] returns for stroke follow-up accompanied by her husband. Reports continued mild left hand tremor with mild left hand weakness -stable without  worsening Prior complaints of gait unsteadiness -reports great improvement after working with PT -she has been able to increase ambulation distance - per husband, able to walk 3-4 blocks without stopping where previously difficulty ambulating very short distances She also reports occasional dizziness typically with position changes that last short duration  She does report in December, she presented to the ED with acute onset right eye visual loss which lasted approximately 15 minutes and then completely resolved.  CTA head/neck unremarkable. Reports evaluation by ophthalmology with no concerning findings that may have possibly contributed.  She has not had any additional visual loss or visual symptoms  Reports compliance on Eliquis 5 mg twice daily -denies bleeding or bruising Glucose levels stable with recent A1c 6.5 (04/2020) Routinely follows with cardiology, PCP and endocrinology  Due to concerns of continued excessive daytime fatigue, she was referred to [facility] for possible repeat sleep study as prior sleep study in 08/2018 was negative for sleep apnea.  Evaluated by Dr. Brett Fairy and underwent sleep study on 03/29/2020 which showed AHI 12.8 with REM sleep associated with continuous low oxygen saturation with total O2 desat time 70 minutes with SPO2 nadir 76% and mild PLM.  Underwent CPAP titration 04/21/2020 with apnea unresponsive to hypopneas, apnea  and desaturations but OSA responded partially to BiPAP 14/10 with central sleep apnea present with higher BiPAP pressures, sleep-related hypoxemia improved greatly on CPAP and BiPAP, presence of mild PLM confirmed.  Recommended initiating auto BiPAP which she started on 2/9.  She appears to be tolerating without difficulty but concerned as she continues to feel daytime fatigue.  No further concerns at this time   Update 12/21/2019 JM: Ms. Porco returns for stroke follow-up.  She is accompanied by her husband. Residual deficits of left  hand tremor, unsteadiness and short-term memory impairment.  She will have occasional dizziness but typically upon standing from seated position.  Denies any reoccurring diplopia Prior concern of worsening diplopia, imbalance, dizziness and left hand shaking therefore obtain MRI which was negative for acute abnormality She was referred to PT at prior visit but for unknown reason, they were not called to schedule initial evaluation.  She is interested in pursuing PT Denies new stroke/TIA symptoms Remains on Eliquis 5 mg twice daily without bleeding or bruising for secondary stroke prevention and history of atrial fibrillation Blood pressure today 128/64 Glucose levels stable per patient HTN, HLD and DM currently managed by PCP She has multiple other complaints today: She also reports excessive daytime fatigue, insomnia, occasional nocturia and snoring Underwent sleep study approximately 1 year ago which showed mild sleep apnea with AHI 5.0.  Does report worsening daytime fatigue and insomnia and she continues to question accuracy of sleep study results She is currently being evaluated by neurosurgery for right underarm and chest numbness/tingling with concerns of thoracic involvement.  Plans on obtaining MRI cervical and thoracic scheduled on 01/15/2020 No further concerns at this time  Update 09/14/2019 JM: Ms. Billiot is a 76 year old female with underlying medical history of DVT history, aortic stenosis s/p TAVR 06/2018, hypothyroidism, pulmonary hypertension, HTN, DM, CAD s/p stenting 03/2018, hx of infective endocarditis 09/2018 and multifocal bilateral anterior posterior infarcts secondary to new onset A. fib in 09/2018.  Patient requests appointment today with concern for intermittent double vision, imbalance, dizziness and left hand shaking.  She is accompanied by her husband.  Previously seen roughly 6 months ago stable from a stroke standpoint and advised to follow-up as needed.  Prior complaints  of intermittent diplopia, left hemiparesis and imbalance secondary to multiple cardioembolic strokes throughout the supratentorial brain, brainstem and cerebellum including multiple lesions within the corpus callosum.  She believes she continued to have intermittent symptoms but believes over the past few months symptoms have worsened or become more frequent.  she did have ophthalmology evaluation on 09/07/2019 with concern of bilateral diplopia worsened with visual tasks with saccadic changes.  Experiences worsening dizziness or diplopia while watching TV for too long.  Also experience dizziness if standing too quickly or rapid head movements.  Symptoms only last for 10 to 20 seconds and then resolve.  Episodes occur 1-2 times per week.  Denies headache, migraine, worsening weakness, numbness/tingling, speech impairment or confusion.  She endorses ongoing compliance with all prescribed medications including Eliquis 5 mg twice daily.  Blood pressure monitored at home which has been stable with today's reading 125/75.  Underlying mild cognitive impairment post stroke which has been stable without worsening.  She questions clearance for driving.  Continues to follow with PCP for HTN, HLD and DM management.  No further concerns at this time.  Update 03/12/2019 JM: Ms. Recht is a 76 year old female who is being seen today for stroke follow-up accompanied by her husband.  She continues to experience balance  difficulties, mild left hemiparesis and mild cognitive impairment without much improvement.  Her greatest concern today is ongoing shortness of breath which has been present since the beginning of this year.  Her shortness of breath severely limits her daily functioning and activity.  Recently seen by Dr. Tamala Webster, her cardiologist, who felt continued shortness of breath was multifactorial but highly encouraged increasing activity and possibly cardiac rehab.  She feels as though "something is being missed" and is  overly frustrated.  She also continues to experience excessive daytime fatigue and insomnia at night.  Prior sleep study negative for OSA.  She continues on Eliquis and aspirin without bleeding or bruising.  Blood pressure today satisfactory at 128/78.  Continues on home Lexapro 20 mg daily and feels as though depression and anxiety stable.  She also continues on amitriptyline 25 mg nightly for prior concerns with headache.  She denies any reoccurring headaches at this time.  No further concerns at this time.  Initial visit 12/10/2018 JM: Residual deficits of mild left hemiparesis, mild left facial droop with occasional speech difficulty, balance difficulties, decreased vision and short-term memory concerns.  She does have difficulty ambulating at times stating her legs feel "rubbery" typically worse worsening in the morning and in the evening.  She is able to ambulate without assistive device.  Prior complaints of left-sided vision with sensation of a shade pulled over her left eye but this is since resolved and recently obtained new prescription glasses by ophthalmology.  She also endorses short-term memory concerns such as forgetting recent question/answer or her husband having to continuously repeat himself.  She denies improvement but her husband states he has seen improvement of her overall ambulation and memory concerns.  She has not received any therapy as this was not recommended at discharge.  She is questioning possible participation in therapy. Previously experiencing left-sided severe headache with recent cessation with continued use of  amitriptyline 25 mg nightly and gabapentin 300 mg nightly.  It was recommended to discontinue gabapentin 300 mg nightly by PCP as she did not gain benefit but she is not aware of this and has continued.  Symptoms previously located left occipital radiating to frontal region with short duration sharp stabbing radiating pain which appears to be secondary to possible  occipital neuralgia. Previously underwent sleep study for possible OSA which was negative Continues on Eliquis and aspirin 81 mg without bleeding or bruising Continues to follow with PCP for DM management Blood pressure today 107/60 States depression/anxiety well controlled with continuation of Lexapro No further concerns at this time  Stroke admission 10/21/2018: Ms. JACKALYN HAITH is a 76 y.o. female with history of DVT, aortic stenosis s/p TAVR in March 2020, hypothyroidism, pulmonary hypertension, HTN, CADs/p stenting December 2019 recently admitted for infective endocarditis on the mitral valve treated with 2 weeks gentamicin and ceftriaxone via PEG who presented on 10/21/2018 to the hospital with increased shortness of breath and confusion x2 weeks along with headache.  No focal neurologic symptoms.  New A. Fib with RVR found in the ED and started on heparin drip.  Neurology consulted with work-up revealing multifocal bilateral anterior and posterior infarcts cardioembolic pattern as evidenced on MRI secondary to new onset A. fib versus recent endocarditis.  MRI showed diffuse scattered subcentimeter foci of reduced diffusion and intermediate diffusion throughout the supratentorial brain, brainstem and cerebellum including multiple lesions within the corpus callosum.  Vessel imaging unremarkable with only mild arthrosclerotic bilateral ICA bifurcations.  2D echo normal EF without mention  of vegetation.  Recommended initiating D Silvis with new onset of A. fib.  HTN stable.  LDL 85 and due to history of statin allergy with myalgias, statin initiation was not recommended.  A1c 7.5 and recommended follow-up with PCP for uncontrolled DM.  Other stroke risk factors include advanced age, obesity, CAD, MVR and status post TAVR 06/2018.  She was discharged home in stable condition without therapy.  She returned to ED on 11/17/2018 with complaints of sudden onset left-sided headache located left parietal occipital  area at the base of her neck.  CTA unremarkable for dissection or other abnormality.  She did receive 2 migraine cocktails with improvement of headache symptoms.  She has since followed up with her PCP who initiated gabapentin along with continuation of nortriptyline 50 mg nightly.  She apparently did not gain benefit from gabapentin therefore recommended discontinuing gabapentin and nortriptyline by PCP and initiated on amitriptyline.  She did have follow-up with ophthalmologist with concerns of occipital neuralgia.        ROS:   14 system review of systems performed and negative with exception of see HPI  PMH:   Past Medical History:  Diagnosis Date   Anxiety    Arthritis    "back" (04/22/2018)   Back pain    Blood transfusion without reported diagnosis    CAD (coronary artery disease)    a. 03/2018 s/p PCI/DES to the RCA (3.0x15 Onyx DES).   Carotid artery stenosis    Mild   Chest pain    Chronic lower back pain    Cirrhosis (HCC)    Colon polyps    Diverticulitis    Diverticulosis    Esophageal thickening    seen on pre TAVR CT scan, also questionable cirrhosis. MRI recommended. Will refer to GI   Fatty liver    GERD (gastroesophageal reflux disease)    Grave's disease    History of colonic polyps 05/22/2017   History of hiatal hernia    Hypertension    Hypothyroidism    IBS (irritable bowel syndrome)    Osteopenia    Pulmonary nodules    seen on pre TAVR CT. likley benign. no follow up recommended if pt low risk.   S/P TAVR (transcatheter aortic valve replacement)    Severe aortic stenosis    Shortness of breath on exertion    Stroke (HCC)    Thalassemia minor    Thyroid disease    Type II diabetes mellitus (HCC)     PSH:  Past Surgical History:  Procedure Laterality Date   55 HOUR [place] STUDY N/A 03/03/2018   Procedure: 24 HOUR PH STUDY;  Surgeon: Mauri Pole, MD;  Location: [facility];  Service: Endoscopy;  Laterality: N/A;   AORTIC VALVE  REPLACEMENT  06/2018   COLONOSCOPY     COLONOSCOPY W/ BIOPSIES AND POLYPECTOMY     CORONARY ANGIOGRAPHY Right 04/21/2018   Procedure: CORONARY ANGIOGRAPHY (CATH LAB);  Surgeon: [name], MD;  Location: [place] [facility];  Service: Cardiovascular;  Laterality: Right;   CORONARY STENT INTERVENTION N/A 04/22/2018   Procedure: CORONARY STENT INTERVENTION;  Surgeon: [name], MD;  Location: [place] [facility];  Service: Cardiovascular;  Laterality: N/A;   DILATION AND CURETTAGE OF UTERUS     ESOPHAGEAL MANOMETRY N/A 03/03/2018   Procedure: ESOPHAGEAL MANOMETRY (EM);  Surgeon: Mauri Pole, MD;  Location: [facility];  Service: Endoscopy;  Laterality: N/A;   GASTRIC FUNDOPLICATION     HERNIA REPAIR  HYSTEROSCOPY     fibroids   LAPAROSCOPIC CHOLECYSTECTOMY     LAPAROSCOPY     fibroids   NISSEN FUNDOPLICATION  3875I   POLYPECTOMY     RIGHT/LEFT HEART CATH AND CORONARY ANGIOGRAPHY N/A 02/20/2018   Procedure: RIGHT/LEFT HEART CATH AND CORONARY ANGIOGRAPHY;  Surgeon: [name], MD;  Location: [place] [facility];  Service: Cardiovascular;  Laterality: N/A;   TEE WITHOUT CARDIOVERSION N/A 07/08/2018   Procedure: TRANSESOPHAGEAL ECHOCARDIOGRAM (TEE);  Surgeon: Burnell Blanks, MD;  Location: [place];  Service: Open Heart Surgery;  Laterality: N/A;   TEE WITHOUT CARDIOVERSION  10/07/2018   TEE WITHOUT CARDIOVERSION N/A 10/07/2018   Procedure: TRANSESOPHAGEAL ECHOCARDIOGRAM (TEE);  Surgeon: Jerline Pain, MD;  Location: [facility];  Service: Cardiovascular;  Laterality: N/A;   TONSILLECTOMY     TRANSCATHETER AORTIC VALVE REPLACEMENT, TRANSFEMORAL N/A 07/08/2018   Procedure: TRANSCATHETER AORTIC VALVE REPLACEMENT, TRANSFEMORAL;  Surgeon: Burnell Blanks, MD;  Location: [place];  Service: Open Heart Surgery;  Laterality: N/A;    Social History:  Social History   Socioeconomic History   Marital status: Married    Spouse name: Not on file   Number of  children: 2   Years of education: Not on file   Highest education level: Not on file  Occupational History   Occupation: Tree surgeon of the [employer]  Tobacco Use   Smoking status: Never   Smokeless tobacco: Never  Vaping Use   Vaping Use: Never used  Substance and Sexual Activity   Alcohol use: No    Alcohol/week: 0.0 standard drinks   Drug use: Never   Sexual activity: Not Currently  Other Topics Concern   Not on file  Social History Narrative   Lives with husband in a one story home.     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   Education: college.   Social Determinants of Health   Financial Resource Strain: Low Risk    Difficulty of Paying Living Expenses: Not hard at all  Food Insecurity: No Food Insecurity   Worried About Charity fundraiser in the Last Year: Never true   [place] in the Last Year: Never true  Transportation Needs: No Transportation Needs   Lack of Transportation (Medical): No   Lack of Transportation (Non-Medical): No  Physical Activity: Inactive   Days of Exercise per Week: 0 days   Minutes of Exercise per Session: 0 min  Stress: No Stress Concern Present   Feeling of Stress : Not at all  Social Connections: Socially Integrated   Frequency of Communication with Friends and Family: More than three times a week   Frequency of Social Gatherings with Friends and Family: More than three times a week   Attends Religious Services: More than 4 times per year   Active Member of [employer] or Organizations: Yes   Attends Music therapist: More than 4 times per year   Marital Status: Married  Human resources officer Violence: Not At Risk   Fear of Current or Ex-Partner: No   Emotionally Abused: No   Physically Abused: No   Sexually Abused: No    Family History:  Family History  Adopted: Yes  Problem Relation Age of Onset   Healthy Son        x 2    Headache Other        Cluster headaches   Heart failure  Mother    Colon cancer Neg Hx    Pancreatic cancer Neg Hx    Rectal cancer Neg Hx    Stomach cancer Neg Hx     Medications:   Current Outpatient Medications on File Prior to Visit  Medication Sig Dispense Refill   amoxicillin-clavulanate (AUGMENTIN) 875-125 MG tablet Take 1 tablet by mouth twice daily for 10 days 20 tablet 0   apixaban (ELIQUIS) 5 MG TABS tablet TAKE ONE TABLET BY MOUTH TWICE A DAY 60 tablet 2   b complex vitamins capsule Take 1 capsule by mouth daily.     BD PEN NEEDLE NANO 2ND GEN 32G X 4 MM MISC      Blood Glucose Monitoring Suppl (ONE TOUCH ULTRA 2) w/Device KIT Use to monitor blood glucose 1 kit 0   cholecalciferol (VITAMIN D) 25 MCG (1000 UNIT) tablet Take 1,000 Units by mouth daily.     diltiazem (CARDIZEM CD) 120 MG 24 hr capsule Take 1 capsule (120 mg total) by mouth daily. 90 capsule 1   escitalopram (LEXAPRO) 20 MG tablet TAKE ONE TABLET BY MOUTH DAILY 90 tablet 1   estradiol (ESTRACE) 0.1 MG/GM vaginal cream Place vaginally.     estradiol (ESTRACE) 0.1 MG/GM vaginal cream Place 1 applicatorful into the vagina 2 nights per week 42.5 g 3   Evolocumab (REPATHA SURECLICK) 474 MG/ML SOAJ Inject 1 dose into the skin every 14 (fourteen) days. 2 mL 11   famotidine (PEPCID) 20 MG tablet Take 1 tablet by mouth every night at bedtime 30 tablet 0   glucose blood test strip Use to check blood sugar 3 times daily 300 each 3   Insulin Pen Needle 32G X 4 MM MISC Use to inject 4 times daily 400 each 2   Insulin Syringe-Needle U-100 (ULTICARE INSULIN SYRINGE) 31G X 5/16" 1 ML MISC FOUR TIMES A DAY 200 each 1   Insulin Syringe-Needle U-100 31G X 5/16" 1 ML MISC Use for injections 4 times daily 200 each 1   Lancets (ONETOUCH DELICA PLUS [id number]) MISC Use 1 lancet 2 times daily 200 each 1   Lancets (ONETOUCH DELICA PLUS [id number]) MISC Use 1 lancet 2 times daily 200 each 0   levothyroxine (SYNTHROID) 125 MCG tablet Take 1  tablet by mouth every morning on an empty stomach 30 tablet 7   levothyroxine (SYNTHROID) 137 MCG tablet Take 137 mcg by mouth once.     levothyroxine (SYNTHROID) 137 MCG tablet Take 1 tablet by mouth every morning on an empty stomach 30 tablet 3   losartan (COZAAR) 50 MG tablet TAKE ONE TABLET BY MOUTH DAILY 90 tablet 1   metFORMIN (GLUCOPHAGE-XR) 500 MG 24 hr tablet Take 500 mg by mouth 2 (two) times daily.     metFORMIN (GLUCOPHAGE-XR) 500 MG 24 hr tablet Take 1 tablet by mouth 2 times daily 180 tablet 2   mirabegron ER (MYRBETRIQ) 50 MG TB24 tablet Take 50 mg by mouth daily.     mirabegron ER (MYRBETRIQ) 50 MG TB24 tablet Take 1 tablet by mouth once daily. 30 tablet 11   Multiple Vitamin (MULITIVITAMIN WITH MINERALS) TABS Take 1 tablet by mouth daily.     nitroGLYCERIN (NITROSTAT) 0.4 MG SL tablet Place 1 tablet (0.4 mg total) under the tongue every 5 (five) minutes as needed for chest pain. 25 tablet 3   NOVOLIN N 100 UNIT/ML injection Inject 28 Units into the skin daily before breakfast.     NOVOLIN R 100 UNIT/ML injection 8 Units  2 (two) times daily before a meal. 10 UNITS THIRTY MINUTES BEFORE MEALS.  5 ADDITIONAL UNITS WITH CARBS OR SNACKS. (04/26/2021:  Pt reports she only takes the 8 units 2x/daily before meals, not the 10 units)     omeprazole (PRILOSEC) 20 MG capsule Take 1 capsule (20 mg total) by mouth 2 (two) times daily before a meal. 60 capsule 3   omeprazole (PRILOSEC) 20 MG capsule Take 1 capsule by mouth twice daily before meals 180 capsule 1   OneTouch Delica Lancets 32R MISC Tests BS 2 times     No current facility-administered medications on file prior to visit.    Allergies:   Allergies  Allergen Reactions   Statins Other (See Comments)    Muscle aches     Physical Exam  Vitals:   06/21/21 1002  BP: (!) 146/73  Pulse: 80  Weight: 181 lb 8 oz (82.3 kg)  Height: 5' 5"  (1.651 m)   Body mass index is 30.2 kg/m. No results found.  General: well developed,  well nourished, pleasant elderly Caucasian female, seated, mildly anxious Head: head normocephalic and atraumatic.   Neck: supple with no carotid or supraclavicular bruits Cardiovascular: regular rate and rhythm, no murmurs Musculoskeletal: no deformity; pain with pressure over left lateral rib area Skin:  no rash/petichiae; ecchymosis left orbital and flank area Vascular:  Normal pulses all extremities   Neurologic Exam Mental Status: Awake and fully alert. Fluent speech and language. Oriented to place and time. Recent memory mildly impaired and remote memory intact.  Attention span, concentration and fund of knowledge mostly appropriate. Mood and affect appropriate.  Cranial Nerves: Pupils equal, briskly reactive to light. Extraocular movements full without nystagmus although mild OS convergence insufficiency.  Visual fields full to confrontation. Hearing intact. Facial sensation intact.  Mild left lower facial weakness - chronic  Motor: Normal bulk and tone.  Full strength in all tested extremities except slight decreased left hand dexterity and mild tremor with outstretched arm Sensory.: intact to touch , pinprick , position and vibratory sensation.  Coordination: Rapid alternating movements normal in all extremities except tremor and coordination decreased left hand. Finger-to-nose showed slight left hand ataxia . Gait and Station: Arises from chair with mild difficulty. Stance is normal. Gait demonstrates slow cautious steps with mild imbalance and generalized mild swaying from side to side.  Tandem walk and heel toe not attempted.  Romberg negative. Reflexes: 1+ and symmetric. Toes downgoing.   ASSESSMENT: WAUNITA SANDSTROM is a 76 y.o. year old female presented with increased shortness of breath and confusion x2 weeks along with headache on 10/21/2018 with recent admission on 10/03/2018 for sepsis and mitral valve endocarditis.  Stroke work-up revealed multifocal bilateral anterior and  posterior infarcts cardioembolic pattern secondary to new onset A. fib versus recent endocarditis. Vascular risk factors include history of DVT, aortic stenosis status post TAVR 06/2018, pulmonary hypertension, HTN, DM, CAD status post stenting 03/2018, endocarditis, and A. Fib on Eliquis. Prior dx of sleep apnea but on recent sleep study no evidence of sleep apnea    PLAN:  Sleep apnea- not discussed today, last sleep study was negative . Hx multiple strokes including cerebellar and pontine stroke:  Residual deficits: mild LUE decreased dexterity with intermittent tremor and cognitive impairment.  Continue Eliquis (apixaban) daily and start Repatha for secondary stroke prevention.   Discussed secondary stroke prevention measures and importance of close PCP/endocrinology follow-up for aggressive stroke risk factor management including HTN with BP goal<130/90 and DM with A1c  goal<7 Atrial fibrillation: On Eliquis 5 mg twice daily routinely monitored by cardiology. Echo reviewed.  Tremor in left hand, parkinsonian- at rest -. I started her on a trial on SINEMT 25/100 bid for the test time. Continue OT.    Follow-up in 4 months or call earlier if needed- MRI cervical and Brain WO contrast was ordered but not performed.     CC:  [facility] provider: Dr. Leonie Man,  [name], NP    I spent a prolonged >30 min of face-to-face and non-face-to-face time with patient and husband.    This included previsit chart review including review of recent ED visit, lab review, study review, order entry, electronic health record documentation, patient and husband discussion and education regarding prior history of stroke and etiology, residual deficits, worsening imbalance and falls, left sided pain post fall,  and answered all other related questions, importance of managing stroke risk factors, and answered all other multiple questions to patient and husband satisfaction  Breanna Seat, MD   [facility] [address] [place], [place] [address]  Phone [phone] Fax [phone] Note: This document was prepared with digital dictation and possible smart phrase technology. Any transcriptional errors that result from this process are unintentional.

## 2021-06-22 ENCOUNTER — Ambulatory Visit: Payer: Medicare Other | Admitting: Occupational Therapy

## 2021-06-22 NOTE — Progress Notes (Signed)
Received phone call from patient wanting to apply for Patient assistance for her Myrbetriq. Per MP patient may not qualify for approval. Patient advised shed still like to give it a try, filled out Benefit verification form for program and mailed to prescribing office for completion. Patient aware. ? ? ?Ned Clines CMA ?Clinical Pharmacist Assistant ?[phone] ? ?

## 2021-06-23 ENCOUNTER — Other Ambulatory Visit (HOSPITAL_BASED_OUTPATIENT_CLINIC_OR_DEPARTMENT_OTHER): Payer: Self-pay

## 2021-06-23 MED ORDER — ONETOUCH DELICA PLUS LANCET33G MISC
1 refills | Status: DC
  Filled 2021-06-23: qty 200, 100d supply, fill #0
  Filled 2022-06-07: qty 200, 90d supply, fill #0

## 2021-06-23 MED ORDER — "INSULIN SYRINGE-NEEDLE U-100 31G X 5/16"" 1 ML MISC"
1 refills | Status: DC
  Filled 2021-06-23: qty 100, 25d supply, fill #0
  Filled 2021-08-09: qty 100, 25d supply, fill #1

## 2021-06-23 MED ORDER — UNIFINE PENTIPS 32G X 4 MM MISC
2 refills | Status: DC
  Filled 2021-06-23: qty 400, 90d supply, fill #0
  Filled 2022-02-09 – 2022-05-10 (×4): qty 400, 90d supply, fill #1

## 2021-06-23 MED ORDER — ONETOUCH VERIO VI STRP
ORAL_STRIP | 4 refills | Status: DC
  Filled 2021-06-23: qty 300, 90d supply, fill #0
  Filled 2022-01-09: qty 100, 34d supply, fill #0
  Filled 2022-06-07: qty 100, 30d supply, fill #0

## 2021-06-25 DIAGNOSIS — Z20822 Contact with and (suspected) exposure to covid-19: Secondary | ICD-10-CM | POA: Diagnosis not present

## 2021-06-26 ENCOUNTER — Encounter: Payer: Self-pay | Admitting: Neurology

## 2021-06-26 ENCOUNTER — Ambulatory Visit: Payer: Medicare Other | Attending: Adult Health | Admitting: Physical Therapy

## 2021-06-26 ENCOUNTER — Other Ambulatory Visit: Payer: Self-pay

## 2021-06-26 ENCOUNTER — Other Ambulatory Visit (HOSPITAL_BASED_OUTPATIENT_CLINIC_OR_DEPARTMENT_OTHER): Payer: Self-pay

## 2021-06-26 DIAGNOSIS — R278 Other lack of coordination: Secondary | ICD-10-CM | POA: Diagnosis not present

## 2021-06-26 DIAGNOSIS — M6281 Muscle weakness (generalized): Secondary | ICD-10-CM | POA: Insufficient documentation

## 2021-06-26 DIAGNOSIS — R29898 Other symptoms and signs involving the musculoskeletal system: Secondary | ICD-10-CM | POA: Diagnosis not present

## 2021-06-26 DIAGNOSIS — R2689 Other abnormalities of gait and mobility: Secondary | ICD-10-CM | POA: Insufficient documentation

## 2021-06-26 DIAGNOSIS — R2681 Unsteadiness on feet: Secondary | ICD-10-CM | POA: Insufficient documentation

## 2021-06-26 MED ORDER — ONETOUCH VERIO FLEX SYSTEM W/DEVICE KIT: PACK | 0 refills | Status: DC

## 2021-06-26 NOTE — Therapy (Signed)
[facility] [address] [place], [place], [address] Phone: [phone]   Fax:  [phone]  Physical Therapy Treatment  Patient Details  Name: Breanna Webster MRN: [id number] Date of Birth: [birth year] Referring Provider (PT): Dohmeier, Asencion Partridge   Encounter Date: 06/26/2021   PT End of Session - 06/26/21 1957     Visit Number 15    Number of Visits 23    Date for PT Re-Evaluation 07/18/21    Authorization Type Medicare/Generic Commercial    Progress Note Due on Visit 20    PT Start Time 1400    PT Stop Time 1445    PT Time Calculation (min) 45 min    Equipment Utilized During Treatment Gait belt   large yellow noodle, small bar bells   Activity Tolerance Patient tolerated treatment well    Behavior During Therapy WFL for tasks assessed/performed             Past Medical History:  Diagnosis Date   Anxiety    Arthritis    "back" (04/22/2018)   Back pain    Blood transfusion without reported diagnosis    CAD (coronary artery disease)    a. 03/2018 s/p PCI/DES to the RCA (3.0x15 Onyx DES).   Carotid artery stenosis    Mild   Chest pain    Chronic lower back pain    Cirrhosis (HCC)    Colon polyps    Diverticulitis    Diverticulosis    Esophageal thickening    seen on pre TAVR CT scan, also questionable cirrhosis. MRI recommended. Will refer to GI   Fatty liver    GERD (gastroesophageal reflux disease)    Grave's disease    History of colonic polyps 05/22/2017   History of hiatal hernia    Hypertension    Hypothyroidism    IBS (irritable bowel syndrome)    Osteopenia    Pulmonary nodules    seen on pre TAVR CT. likley benign. no follow up recommended if pt low risk.   S/P TAVR (transcatheter aortic valve replacement)    Severe aortic stenosis    Shortness of breath on exertion    Stroke ([place])    Thalassemia minor    Thyroid disease    Type II diabetes mellitus ([place])     Past Surgical History:   Procedure Laterality Date   53 HOUR [place] STUDY N/A 03/03/2018   Procedure: 24 HOUR PH STUDY;  Surgeon: Mauri Pole, MD;  Location: [facility];  Service: Endoscopy;  Laterality: N/A;   AORTIC VALVE REPLACEMENT  06/2018   COLONOSCOPY     COLONOSCOPY W/ BIOPSIES AND POLYPECTOMY     CORONARY ANGIOGRAPHY Right 04/21/2018   Procedure: CORONARY ANGIOGRAPHY (CATH LAB);  Surgeon: [name], MD;  Location: [facility];  Service: Cardiovascular;  Laterality: Right;   CORONARY STENT INTERVENTION N/A 04/22/2018   Procedure: CORONARY STENT INTERVENTION;  Surgeon: [name], MD;  Location: [facility];  Service: Cardiovascular;  Laterality: N/A;   DILATION AND CURETTAGE OF UTERUS     ESOPHAGEAL MANOMETRY N/A 03/03/2018   Procedure: ESOPHAGEAL MANOMETRY (EM);  Surgeon: Mauri Pole, MD;  Location: [facility];  Service: Endoscopy;  Laterality: N/A;   GASTRIC FUNDOPLICATION     HERNIA REPAIR     HYSTEROSCOPY     fibroids   LAPAROSCOPIC CHOLECYSTECTOMY     LAPAROSCOPY     fibroids   NISSEN FUNDOPLICATION  2706C   POLYPECTOMY  RIGHT/LEFT HEART CATH AND CORONARY ANGIOGRAPHY N/A 02/20/2018   Procedure: RIGHT/LEFT HEART CATH AND CORONARY ANGIOGRAPHY;  Surgeon: [name], MD;  Location: [facility];  Service: Cardiovascular;  Laterality: N/A;   TEE WITHOUT CARDIOVERSION N/A 07/08/2018   Procedure: TRANSESOPHAGEAL ECHOCARDIOGRAM (TEE);  Surgeon: Burnell Blanks, MD;  Location: [facility];  Service: Open Heart Surgery;  Laterality: N/A;   TEE WITHOUT CARDIOVERSION  10/07/2018   TEE WITHOUT CARDIOVERSION N/A 10/07/2018   Procedure: TRANSESOPHAGEAL ECHOCARDIOGRAM (TEE);  Surgeon: Jerline Pain, MD;  Location: [facility] ENDOSCOPY;  Service: Cardiovascular;  Laterality: N/A;   TONSILLECTOMY     TRANSCATHETER AORTIC VALVE REPLACEMENT, TRANSFEMORAL N/A 07/08/2018   Procedure: TRANSCATHETER AORTIC VALVE REPLACEMENT, TRANSFEMORAL;  Surgeon: Burnell Blanks,  MD;  Location: [facility];  Service: Open Heart Surgery;  Laterality: N/A;    There were no vitals filed for this visit.   Subjective Assessment - 06/26/21 1953     Subjective Pt reports she didn't have a very good week last week; states she saw Dr. Brett Fairy and she prescribed medication to help the tremors in her Lt hand but states the medication did not help at all; states they are trying to figure out if she has Parkinson's or Parkinsonism    Pertinent History significant arthritis in knees    Patient Stated Goals Pt's goals for therapy are to get better with walking and with arthritis.    Currently in Pain? Yes    Pain Score 8     Pain Location Knee    Pain Orientation Right    Pain Descriptors / Indicators Aching;Discomfort    Pain Type Chronic pain    Pain Onset More than a month ago    Pain Frequency Intermittent    Aggravating Factors  weight bearing    Pain Relieving Factors pt reports no knee pain when doing exercises in the pool                Aquatic therapy at [place] 92 degrees  Patient seen for aquatic therapy today.  Treatment took place in water 3.5-4.5 feet deep depending upon activity.  Pt entered & exited the pool  the pool via step negotiation using step by step sequence for descension and ascension with use of hand rails with supervision  Pt performed water walking 18' x 6 reps forwards across pool, using bar bells initially for approx. 4 reps and then without barbells to work on balance with gait; 18' x 4 reps with UE support with use of bar bells;  18' x 2 reps backwards across width of pool with UE support on PT's forearms  Pt performed marching in place 10 reps with bil. UE support on pool edge prn;  marching forwards 18' x 1 rep with use of bar bells   Pt performed standing balance exercises with UE support on edge of pool - marching in place 10 reps each leg;   Pt performed stepping strategy exercise for improved balance  with large amplitude movements of UE's and LE's - pt stepped forwardback 10 reps each leg with ipsilateral UE movement, then stepped laterally 10 reps each leg with CGA prn for balance recovery; pt then stepped back/forward with LE's only - no UE movement with this direction due to challenge with balance   Pt performed squats at edge of pool 10 reps Pt performed Ai Chi posture - "Balancing" with PT holding pt's UE for both assist with balance and  for cueing for correct sequence with the exercise as UE moved in opposite direction of LE; pt held pool edge with other UE - 10 reps each side   Pt performed water walking (forward) 18' x 2 reps with no UE support at end of session - 18' x 2 reps with cues to amb. At faster speed and to increase arm swing  Pt requires buoyancy of water for support and safety with standing balance exercises; exercises are able to be safely attempted/performed in water without or with minimal UE support that cannot be performed safely on land.  Viscosity of water is needed for resistance for strengthening.  Current of water provides perturbations for challenges with standing balance.  Buoyancy of water also provides joint offloading which increases comfort and reduces pain in knees with weight bearing activities and exs in water, whereas, this is unable to be achieved with weight bearing exercises on land.                             PT Short Term Goals - 06/26/21 2006       PT SHORT TERM GOAL #1   Title Pt will be independent with HEP for improved strength and flexibility, balance, transfers, and gait.   TARGET 05/26/2021    Time 4    Period Weeks    Status Achieved      PT SHORT TERM GOAL #2   Title Pt will improve Berg score to at least 41/56 to decrease fall risk.    Baseline 36/56    Time 4    Period Weeks    Status Achieved      PT SHORT TERM GOAL #3   Title Pt will improve TUG score to less than or equal to 21 sec for decreased fall  risk.    Baseline 26.88 sec    Time 4    Period Weeks    Status Achieved      PT SHORT TERM GOAL #4   Title Pt will verbalize understanding of fall prevention in home environment.    Time 4    Period Weeks    Status Achieved               PT Long Term Goals - 06/26/21 2006       PT LONG TERM GOAL #1   Title Pt will be independent with HEP for improved flexibility and strength, balance, transfers, and gait.  TARGET 06/23/2021    Time 5    Period Weeks    Status Partially Met   met for current   Target Date 07/18/21      PT LONG TERM GOAL #2   Title Pt will improve Berg score to at least 46/56 to decrease fall risk.    Time 5    Period Weeks    Status On-going   44/56   Target Date 07/18/21      PT LONG TERM GOAL #3   Title Pt will improve TUG score to less than or equal to 16 sec for decreased fall risk.    Time 5    Period Weeks    Status Partially Met   16.31 sec   Target Date 07/18/21      PT LONG TERM GOAL #4   Title Pt will improve gait velocity to at least 2 ft/sec for improved gait efficiency and safety.    Baseline 1.73 ft/sec    Time 9  Period Weeks    Status Achieved   2.69 ft/s     PT LONG TERM GOAL #5   Title Pt will verbalize plans for continued community fitness upon d/c to maximize gains made in PT.    Time 9    Period Weeks    Status Achieved   reports plans to join gym and walk at [employer] - 06/26/21 1958     Clinical Impression Statement Aquatic therapy session focused on balance and gait training in 4' water depth.  Pt demonstrates improvement in balance as she was able to amb. without holding onto noodle today as she has requested to hold in previous sessions to assist with balance and for support.  Pt performed standing balance exercises holding onto bar bells with CGA to min assist for balance recovery with stepping with large amplitude movement of UE's and LE's - forward, back and side.  Pt  demonstrates improved core stabilization with ability to stand with feet together and moving small bar bells with UE's in various directions.  Pt did need cues to increase arm swing with water walking when amb. at faster speed. Pt reported no Rt knee pain during aquatic session.  Cont with POC.    Personal Factors and Comorbidities Comorbidity 3+    Comorbidities CVA due to embolism R PCA; COVID -19, post-viral fatigue syndrome  PMH COVID 11/2020, arthritis, back pain, CAD, cirrhosis, diverticulitis/osis, GERD, HTN, IBS, osteopenia, PAF    Examination-Activity Limitations Locomotion Level;Transfers;Stand;Caring for Others;Dressing    Examination-Participation Restrictions Meal Prep;Cleaning;Community Activity;Laundry    Stability/Clinical Decision Making Evolving/Moderate complexity    Rehab Potential Good    PT Frequency 2x / week    PT Duration Other (comment)   5 weeks   PT Treatment/Interventions ADLs/Self Care Home Management;Aquatic Therapy;DME Instruction;Neuromuscular re-education;Balance training;Therapeutic exercise;Therapeutic activities;Functional mobility training;Gait training;Patient/family education;Manual techniques    PT Next Visit Plan Work on standing balance, gait activities.  Gait training with cane outdoor surfaces; work on timing/coordination with LLE hip/knee flexion with gait.    Consulted and Agree with Plan of Care Patient             Patient will benefit from skilled therapeutic intervention in order to improve the following deficits and impairments:  Abnormal gait, Difficulty walking, Impaired flexibility, Decreased balance, Decreased mobility, Decreased strength, Postural dysfunction, Pain  Visit Diagnosis: Other abnormalities of gait and mobility  Muscle weakness (generalized)  Unsteadiness on feet     Problem List Patient Active Problem List   Diagnosis Date Noted   Left hemiparesis ([place]) 06/21/2021   Other specified hereditary hemolytic anemias  ([place]) 06/21/2021   Parkinsonism ([place]) 06/21/2021   COVID-19 02/14/2021   Postviral fatigue syndrome 02/14/2021   Difficulty with adaptive servo-ventilation (ASV) use 02/14/2021   Sepsis secondary to UTI ([place]) 09/08/2020   Elevated ALT measurement 09/08/2020   History of CVA with residual deficit 09/08/2020   Hyperbilirubinemia 09/08/2020   Fatigue associated with anemia 08/03/2020   Cerebrovascular accident (CVA) due to embolism of right posterior cerebral artery ([place]) 08/03/2020   OSA treated with BiPAP 08/03/2020   Complex sleep apnea syndrome 08/03/2020   Treatment-emergent central sleep apnea 08/03/2020   Chronic intermittent hypoxia with obstructive sleep apnea 04/21/2020   OSA (obstructive sleep apnea) 04/21/2020   History of cardioembolic stroke 84/53/6468   Gait disturbance, post-stroke 03/29/2020   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 (  [place]) 03/29/2020   Anxiety    RLQ abdominal pain 10/22/2019   Paroxysmal atrial fibrillation ([place]) 05/22/2019   Iron deficiency anemia 05/07/2019   Atrial fibrillation with RVR ([place]) 10/21/2018   Cerebellar stroke, acute ([place]) 10/21/2018   Streptococcal endocarditis    Endocarditis of mitral valve 10/07/2018   Bacteremia due to Streptococcus Salivarius 10/07/2018   Sepsis ([place]) 68/11/8108   Acute metabolic encephalopathy 31/59/4585   Severe aortic stenosis 07/08/2018   S/P TAVR (transcatheter aortic valve replacement) 07/08/2018   Esophageal thickening    CAD in native artery 04/22/2018   Gastroesophageal reflux disease    Pulmonary hypertension ([place]) 02/21/2018   Essential hypertension 07/15/2017   History of colonic polyps 05/22/2017   Elevated liver function tests 12/05/2016   DOE (dyspnea on exertion) 07/19/2016   Thalassemia minor 05/29/2016   Left bundle branch block 12/06/2015   Upper airway cough syndrome 92/92/4462   Diastolic heart failure ([place]) 10/10/2015   Myalgia 02/17/2014   Carotid artery stenosis  06/05/2013   Eustachian tube dysfunction 05/07/2013   Neuropathy of leg 03/07/2012   Anemia, unspecified 01/31/2012   Hot flashes 08/24/2010   Diabetes mellitus due to underlying condition, uncontrolled 06/30/2010   Generalized abdominal pain 03/24/2010   Obesity (BMI 30.0-34.9) 12/21/2009   IBS (irritable bowel syndrome) 04/29/2009   Vitamin D deficiency 03/10/2009   Acute bronchitis 03/07/2009   Hypothyroidism 12/13/2008   Dyslipidemia 12/13/2008   Anxiety state 12/13/2008   Other specified disorders of bladder 12/13/2008    Alda Lea, PT 06/26/2021, 8:07 PM  [facility] [address] [place], [place], [address] Phone: [phone]   Fax:  [phone]  Name: Breanna Webster MRN: [id number] Date of Birth: 03/14/1946

## 2021-06-27 ENCOUNTER — Ambulatory Visit: Payer: Medicare Other | Admitting: Physical Therapy

## 2021-06-27 ENCOUNTER — Encounter: Payer: Self-pay | Admitting: Internal Medicine

## 2021-06-27 ENCOUNTER — Encounter: Payer: Self-pay | Admitting: Occupational Therapy

## 2021-06-27 ENCOUNTER — Encounter: Payer: Self-pay | Admitting: Physical Therapy

## 2021-06-27 ENCOUNTER — Ambulatory Visit: Payer: Medicare Other | Admitting: Occupational Therapy

## 2021-06-27 ENCOUNTER — Other Ambulatory Visit (HOSPITAL_BASED_OUTPATIENT_CLINIC_OR_DEPARTMENT_OTHER): Payer: Self-pay

## 2021-06-27 ENCOUNTER — Ambulatory Visit (INDEPENDENT_AMBULATORY_CARE_PROVIDER_SITE_OTHER): Payer: Medicare Other | Admitting: Internal Medicine

## 2021-06-27 DIAGNOSIS — R278 Other lack of coordination: Secondary | ICD-10-CM | POA: Diagnosis not present

## 2021-06-27 DIAGNOSIS — R2681 Unsteadiness on feet: Secondary | ICD-10-CM | POA: Diagnosis not present

## 2021-06-27 DIAGNOSIS — R29898 Other symptoms and signs involving the musculoskeletal system: Secondary | ICD-10-CM

## 2021-06-27 DIAGNOSIS — R2689 Other abnormalities of gait and mobility: Secondary | ICD-10-CM

## 2021-06-27 DIAGNOSIS — M6281 Muscle weakness (generalized): Secondary | ICD-10-CM

## 2021-06-27 DIAGNOSIS — R058 Other specified cough: Secondary | ICD-10-CM | POA: Diagnosis not present

## 2021-06-27 DIAGNOSIS — I2510 Atherosclerotic heart disease of native coronary artery without angina pectoris: Secondary | ICD-10-CM

## 2021-06-27 MED ORDER — ACETAMINOPHEN-CODEINE #3 300-30 MG PO TABS
1.0000 | ORAL_TABLET | ORAL | 0 refills | Status: AC | PRN
  Filled 2021-06-27: qty 30, 5d supply, fill #0

## 2021-06-27 MED ORDER — FAMOTIDINE 20 MG PO TABS: ORAL_TABLET | ORAL | Status: DC

## 2021-06-27 MED ORDER — ACETAMINOPHEN-CODEINE #3 300-30 MG PO TABS: 1.0000 | ORAL_TABLET | ORAL | 0 refills | Status: DC | PRN

## 2021-06-27 NOTE — Progress Notes (Signed)
Subjective:     Patient ID: Breanna Webster, female   DOB: [birth year]     MRN: [id number]    Brief patient profile:  66 yowf never smoker with pnds as child allergy tested as child doesn't know details, never took shots,  outgrew completely then symptoms resumed in 90's after moved to [place] from [facility] with coughing that improved p NF late 90s  but still had drainage variably  severe no pattern as to time of year then much worse since summer 2016 with onset of cough/wheeze May 2017 referred to pulmonary clinic 10/14/2015 by Dr Betty [name].      History of Present Illness  10/14/2015 1st [facility] office visit/ Mena Simonis   Chief Complaint  Patient presents with   Pulmonary Consult    Referred by Dr. Betty [name]. Pt c/o cough and wheezing x 5 wks. Cough is occ prod with minimal green sputum.   acute onset cough > green mucus one week p husband's uri May 2017 >  rx with abx/ prednisone improved then worse 2 days p finished but mostly productive  white mucus.  If doesn't take the codeine cough med then coughs worse at hs.  No assoc sob/ some wheeze better with saba  rec The key to effective treatment for your cough is eliminating the non-stop cycle of cough you're stuck in long enough to let your airway heal completely and then see if there is anything still making you cough once you stop the cough suppression, but this should take no more than 5 days to figure out Eliminate all coughing for 3 days with your codiene syrup and then switch delsym 2 tsp every 12 hours  Prednisone 10 mg take  4 each am x 2 days,   2 each am x 2 days,  1 each am x 2 days and stop (this is to eliminate allergies and inflammation from coughing) Protonix (pantoprazole) Take 30-60 min before first meal of the day and Pepcid 20 mg one bedtime plus chlorpheniramine 4 mg x 2 at bedtime (both available over the counter)  until cough is completely gone for at least a week without the need for cough suppression GERD  diet  03/20/17 EGD [name]:  Loose NF, Barrett's    11/11/2017  Extended f/u ov/Carmel Waddington re: doe/ recurrent cough on gerd rx  Chief Complaint  Patient presents with   Follow-up    Pt states she still has the cough. Pt states she will have spasms that will bring on her cough. Pt also has complaints of SOB with exertion and has chest tightness.  Dyspnea:  MMRC3 = can't walk 100 yards even at a slow pace at a flat grade s stopping due to sob  - only able to shop by pushing cart around "anywhere" but with freq stops  Cough: dry sporadic daytime / eating and talking seem to trigger it mostly  rec  To get the most out of exercise, you need to be continuously aware that you are short of breath, but never out of breath, for 30 minutes daily. As you improve, it will actually be easier for you to do the same amount of exercise  in  30 minutes so always push to the level where you are short of breath.    We will arrange a cpst in one month no sooner and I will call you with the results needs repeat echo prior to  CPST >  Pos AS     W/u for AS  02/20/18 LHC/RHC  Mean gradient  18 with wedge 18  and mod PH wih PVR 294 > ONO RA ok/ v/q nl   S/p TAVR 06/2018  MV endocarditis 09/2018   Echo 12/02/2018 1. The left ventricle has normal systolic function, with an ejection fraction of 60-65%. The cavity size was normal. There is moderately increased left ventricular wall thickness. Left ventricular diastolic Doppler parameters are consistent with  impaired relaxation. Elevated left ventricular end-diastolic pressure. Nl LA GI diastolic dysfunction   2. The right ventricle has normal systolc function. The cavity was normal. There is no increase in right ventricular wall thickness. Right ventricular systolic pressure could not be assessed.  3. The mitral valve is degenerative. Mild thickening of the mitral valve leaflet. There is severe mitral annular calcification present. Mild mitral valve stenosis.  4. Pulmonic  valve regurgitation was not assessed by color flow Doppler.  5. - TAVR: S/P TAVR that appears to be functioning normally. There is no perivavluarl AR. The mean AVG is normal at 57mHg. LVOT/AV VTI ratio:0.54. AV Vmax:279.00 cm/s.      [facility] eval 05/22/19  : her dyspnea is due to severe impairment of diastolic inflow into the left heart in the setting of severe LVH and calcific mitral valve disease. Her cirrhosis and anemia causing high output are also contributing to her symptoms of shortness of breath. Optimizing her medical regimen would be beneficial   Rx by NP 03/13/21 Seen today for acute bronchitis symptoms. CXR showed mild bilateral interstitial thickening which can be seen with this. Sending in RX for antibiotics and low dose prednisone d.t productive/purulent cough. We will also check labs d/t vascular congestion seen on CXR which can be from heart failure. You will be getting echocardiogram >>>  ok 03/21/21 x LAE mod   Recommendations: - Doxycycline 1 tablet twice daily x 7 days - Prednisone 121mx 5 days; then 59m27m 5 days; stop  - Start Mucinex 600m59mice a day for 7-10 days or until cough improved (over the counter) - Start Delsym cough syrup twice a day for 7-10 days or until cough improved (over the counter) >>>  gradually better  04/05/21 to ER p fell > 2 days after started hurting midline and to R worse with coughing    04/27/2021  acute ov/Encarnacion Scioneaux re: cough/sob p fell  maint on ppi bid ac  Chief Complaint  Patient presents with   Acute Visit    Pt c/o pain with deep breath after recent fall. She also c/o cough with green sputum.   Dyspnea:  slowed down by knees > sob  Cough: mornings green mucus  Sleeping: head of bed up 5 inches / no longer using cpap  SABA use: none  02: none  Covid status:   vax x 4  Cp gradually improving since fell, still notes with really deep breath on R ant cw Rec Take both prilosec (omeprazole)  20 mg (total of 40 mg) Take 30-60 min before  first meal of the day and pepcid(famotidine) 20 mg one after supper  For cough mucinex dm 1200 mg every 12 hours as needed GERD diet reviewed, bed blocks rec  Augmentin 875 mg take one pill twice daily  X 10 days -  Return to see me 4 weeks if not back to your normal self   06/27/2021  f/u ov/Sherman Donaldson re: uacs maint on prilosec 20 mg x one  (not following instructions)  No chief complaint on file. Dyspnea:  more knees  than breathing limiting  Cough: pnds with  mucus turned cleared p augmentin / pnds/nasal discharge  Sleeping: bed blocks breathing and cough are better when sleeping but wakes with lots of congestion  SABA use: none  02: none  Covid status:   vax x 4    No obvious day to day or daytime variability or assoc  mucus plugs or hemoptysis or cp or chest tightness, subjective wheeze or overt sinus or hb symptoms.   Sleeping  without nocturnal   exacerbation  of respiratory  c/o's or need for noct saba. Also denies any obvious fluctuation of symptoms with weather or environmental changes or other aggravating or alleviating factors except as outlined above   No unusual exposure hx or h/o childhood pna/ asthma or knowledge of premature birth.  Current Allergies, Complete Past Medical History, Past Surgical History, Family History, and Social History were reviewed in [employer] record.  ROS  The following are not active complaints unless bolded Hoarseness, sore throat, dysphagia, dental problems, itching, sneezing,  nasal congestion or discharge of excess mucus or purulent secretions, ear ache,   fever, chills, sweats, unintended wt loss or wt gain, classically pleuritic or exertional cp,  orthopnea pnd or arm/hand swelling  or leg swelling, presyncope, palpitations, abdominal pain, anorexia, nausea, vomiting, diarrhea  or change in bowel habits or change in bladder habits, change in stools or change in urine, dysuria, hematuria,  rash, arthralgias, visual complaints,  headache, numbness, weakness or ataxia or problems with walking or coordination,  change in mood or  memory.                             Objective:   Physical Exam   wts 06/27/2021          178  04/27/2021          176  05/26/2019          192  02/11/2019      192  04/08/2018      204  03/13/2018      202  11/11/2017        204  07/19/2016        196 per pt  12/13/2015       193   11/02/2015       193  10/14/15 192 lb (87.091 kg)  10/10/15 190 lb (86.183 kg)  10/07/15 189 lb 4 oz (85.843 kg)   Vital signs reviewed  06/27/2021  - Note at rest 02 sats  98% on RA   General appearance:    amb somber wf nad, no spont cough or throat clearing   HEENT : pt wearing mask not removed for exam due to covid -19 concerns.    NECK :  without JVD/Nodes/TM/ nl carotid upstrokes bilaterally   LUNGS: no acc muscle use,  Nl contour chest which is clear to A and P bilaterally without cough on insp or exp maneuvers   CV:  RRR  no s3  2/6 sem s  increase in P2, and no edema   ABD:  soft and nontender with nl inspiratory excursion in the supine position. No bruits or organomegaly appreciated, bowel sounds nl  MS:  Nl gait/ ext warm without deformities, calf tenderness, cyanosis or clubbing No obvious joint restrictions   SKIN: warm and dry without lesions    NEURO:  alert, approp, nl sensorium with  no motor or cerebellar deficits apparent.  Assessment:       Outpatient Encounter Medications as of 06/27/2021  Medication Sig   [DISCONTINUED] acetaminophen-codeine (TYLENOL #3) 300-30 MG tablet Take 1 tablet by mouth every 4 (four) hours as needed for up to 5 days (cough).   acetaminophen-codeine (TYLENOL #3) 300-30 MG tablet Take 1 tablet by mouth every 4 (four) hours as needed for up to 5 days (cough).   apixaban (ELIQUIS) 5 MG TABS tablet TAKE ONE TABLET BY MOUTH TWICE A DAY   b complex vitamins capsule Take 1 capsule by mouth daily.   BD PEN NEEDLE NANO 2ND GEN 32G X 4 MM MISC     Blood Glucose Monitoring Suppl (ONE TOUCH ULTRA 2) w/Device KIT Use to monitor blood glucose   Blood Glucose Monitoring Suppl (ONETOUCH VERIO FLEX SYSTEM) w/Device KIT Use as directed 3 times daily   carbidopa-levodopa (SINEMET) 25-100 MG tablet Take 1 tablet by mouth 2 (two) times daily.   cholecalciferol (VITAMIN D) 25 MCG (1000 UNIT) tablet Take 1,000 Units by mouth daily.   diltiazem (CARDIZEM CD) 120 MG 24 hr capsule Take 1 capsule (120 mg total) by mouth daily.   escitalopram (LEXAPRO) 20 MG tablet TAKE ONE TABLET BY MOUTH DAILY   estradiol (ESTRACE) 0.1 MG/GM vaginal cream Place vaginally.   estradiol (ESTRACE) 0.1 MG/GM vaginal cream Place 1 applicatorful into the vagina 2 nights per week   Evolocumab (REPATHA SURECLICK) 387 MG/ML SOAJ Inject 1 dose into the skin every 14 (fourteen) days.   glucose blood (ONETOUCH VERIO) test strip USE TO CHECK BLOOD SUGAR THREE TIMES DAILY   glucose blood test strip Use to check blood sugar 3 times daily   Insulin Pen Needle (UNIFINE PENTIPS) 32G X 4 MM MISC Inject 4 times subcutaneously   Insulin Pen Needle 32G X 4 MM MISC Use to inject 4 times daily   Insulin Syringe-Needle U-100 (ULTICARE INSULIN SYRINGE) 31G X 5/16" 1 ML MISC FOUR TIMES A DAY   Insulin Syringe-Needle U-100 (ULTICARE INSULIN SYRINGE) 31G X 5/16" 1 ML MISC Inject 4 times daily subcutaneously   Insulin Syringe-Needle U-100 31G X 5/16" 1 ML MISC Use for injections 4 times daily   Lancets (ONETOUCH DELICA PLUS [id number]) MISC Use 1 lancet 2 times daily   Lancets (ONETOUCH DELICA PLUS [id number]) MISC Use 1 lancet 2 times daily   Lancets (ONETOUCH DELICA PLUS [id number]) MISC Use twice daily   levothyroxine (SYNTHROID) 125 MCG tablet Take 1 tablet by mouth every morning on an empty stomach   levothyroxine (SYNTHROID) 137 MCG tablet Take 137 mcg by mouth once.   levothyroxine (SYNTHROID) 137 MCG tablet Take 1 tablet by mouth every morning on an empty stomach   losartan (COZAAR) 50 MG  tablet TAKE ONE TABLET BY MOUTH DAILY   metFORMIN (GLUCOPHAGE-XR) 500 MG 24 hr tablet Take 500 mg by mouth 2 (two) times daily.   metFORMIN (GLUCOPHAGE-XR) 500 MG 24 hr tablet Take 1 tablet by mouth 2 times daily   mirabegron ER (MYRBETRIQ) 50 MG TB24 tablet Take 50 mg by mouth daily.   mirabegron ER (MYRBETRIQ) 50 MG TB24 tablet Take 1 tablet by mouth once daily.   Multiple Vitamin (MULITIVITAMIN WITH MINERALS) TABS Take 1 tablet by mouth daily.   nitroGLYCERIN (NITROSTAT) 0.4 MG SL tablet Place 1 tablet (0.4 mg total) under the tongue every 5 (five) minutes as needed for chest pain.   NOVOLIN N 100 UNIT/ML injection Inject 28 Units into the skin daily before breakfast.   NOVOLIN R 100  UNIT/ML injection 8 Units 2 (two) times daily before a meal. 10 UNITS THIRTY MINUTES BEFORE MEALS.  5 ADDITIONAL UNITS WITH CARBS OR SNACKS. (04/26/2021:  Pt reports she only takes the 8 units 2x/daily before meals, not the 10 units)   omeprazole (PRILOSEC) 20 MG capsule Take 1 capsule (20 mg total) by mouth 2 (two) times daily before a meal.        OneTouch Delica Lancets 19I MISC Tests BS 2 times    Pepcid 20 mg  One after supper          No facility-administered encounter medications on file as of 06/27/2021.

## 2021-06-27 NOTE — Patient Instructions (Addendum)
Take both prilosec (omeprazole)  20 mg (total of 40 mg) Take 30-60 min before first meal of the day and pepcid(famotidine) 20 mg one after supper  ? ? ?For drainage / throat tickle try take CHLORPHENIRAMINE  4 mg  ("Allergy Relief" 67m  [facility] box usually on bottom shelf)  take one every 4 hours as needed - extremely effective and inexpensive over the counter- may cause drowsiness so start with just a dose or two an hour before bedtime and see how you tolerate it before trying in daytime.  ? ?For cough >  mucinex dm 1200 mg every 12 hours as needed and supplement with needed tylenol #3 up to one every 4 hours as needed  ? ? ?Follow up is as needed with ENT evaluation next step  ? ? ? ?

## 2021-06-27 NOTE — Therapy (Signed)
[facility] [address] [place], [place], [address] Phone: [phone]   Fax:  [phone]  Physical Therapy Treatment  Patient Details  Name: Breanna Webster MRN: [id number] Date of Birth: October 24, 1945 Referring Provider (PT): Dohmeier, Asencion Partridge   Encounter Date: 06/27/2021   PT End of Session - 06/27/21 1112     Visit Number 16    Number of Visits 23    Date for PT Re-Evaluation 07/18/21    Authorization Type Medicare/Generic Commercial    Progress Note Due on Visit 20    PT Start Time 1019    PT Stop Time 1058    PT Time Calculation (min) 39 min    Equipment Utilized During Treatment Gait belt    Activity Tolerance Patient tolerated treatment well    Behavior During Therapy WFL for tasks assessed/performed             Past Medical History:  Diagnosis Date   Anxiety    Arthritis    "back" (04/22/2018)   Back pain    Blood transfusion without reported diagnosis    CAD (coronary artery disease)    a. 03/2018 s/p PCI/DES to the RCA (3.0x15 Onyx DES).   Carotid artery stenosis    Mild   Chest pain    Chronic lower back pain    Cirrhosis (HCC)    Colon polyps    Diverticulitis    Diverticulosis    Esophageal thickening    seen on pre TAVR CT scan, also questionable cirrhosis. MRI recommended. Will refer to GI   Fatty liver    GERD (gastroesophageal reflux disease)    Grave's disease    History of colonic polyps 05/22/2017   History of hiatal hernia    Hypertension    Hypothyroidism    IBS (irritable bowel syndrome)    Osteopenia    Pulmonary nodules    seen on pre TAVR CT. likley benign. no follow up recommended if pt low risk.   S/P TAVR (transcatheter aortic valve replacement)    Severe aortic stenosis    Shortness of breath on exertion    Stroke ([place])    Thalassemia minor    Thyroid disease    Type II diabetes mellitus ([place])     Past Surgical History:  Procedure Laterality Date   83 HOUR [place] STUDY  N/A 03/03/2018   Procedure: 24 HOUR PH STUDY;  Surgeon: Mauri Pole, MD;  Location: [facility];  Service: Endoscopy;  Laterality: N/A;   AORTIC VALVE REPLACEMENT  06/2018   COLONOSCOPY     COLONOSCOPY W/ BIOPSIES AND POLYPECTOMY     CORONARY ANGIOGRAPHY Right 04/21/2018   Procedure: CORONARY ANGIOGRAPHY (CATH LAB);  Surgeon: [name], MD;  Location: [facility];  Service: Cardiovascular;  Laterality: Right;   CORONARY STENT INTERVENTION N/A 04/22/2018   Procedure: CORONARY STENT INTERVENTION;  Surgeon: [name], MD;  Location: [facility];  Service: Cardiovascular;  Laterality: N/A;   DILATION AND CURETTAGE OF UTERUS     ESOPHAGEAL MANOMETRY N/A 03/03/2018   Procedure: ESOPHAGEAL MANOMETRY (EM);  Surgeon: Mauri Pole, MD;  Location: [facility];  Service: Endoscopy;  Laterality: N/A;   GASTRIC FUNDOPLICATION     HERNIA REPAIR     HYSTEROSCOPY     fibroids   LAPAROSCOPIC CHOLECYSTECTOMY     LAPAROSCOPY     fibroids   NISSEN FUNDOPLICATION  1601U   POLYPECTOMY     RIGHT/LEFT  HEART CATH AND CORONARY ANGIOGRAPHY N/A 02/20/2018   Procedure: RIGHT/LEFT HEART CATH AND CORONARY ANGIOGRAPHY;  Surgeon: [name], MD;  Location: [facility];  Service: Cardiovascular;  Laterality: N/A;   TEE WITHOUT CARDIOVERSION N/A 07/08/2018   Procedure: TRANSESOPHAGEAL ECHOCARDIOGRAM (TEE);  Surgeon: Burnell Blanks, MD;  Location: [facility];  Service: Open Heart Surgery;  Laterality: N/A;   TEE WITHOUT CARDIOVERSION  10/07/2018   TEE WITHOUT CARDIOVERSION N/A 10/07/2018   Procedure: TRANSESOPHAGEAL ECHOCARDIOGRAM (TEE);  Surgeon: Jerline Pain, MD;  Location: [facility];  Service: Cardiovascular;  Laterality: N/A;   TONSILLECTOMY     TRANSCATHETER AORTIC VALVE REPLACEMENT, TRANSFEMORAL N/A 07/08/2018   Procedure: TRANSCATHETER AORTIC VALVE REPLACEMENT, TRANSFEMORAL;  Surgeon: Burnell Blanks, MD;  Location: [facility];  Service: Open Heart  Surgery;  Laterality: N/A;    There were no vitals filed for this visit.   Subjective Assessment - 06/27/21 1021     Subjective The only time I dont have pain is when I'm in the pool. Asking about wrapping up with PT and only continuing with aquatic therapy. May have been diagnosed with Parkinson's syndrome but not sure. Thinking about changing Orthopedists to get a 2nd opinion on her knee pain. Plans to go to the gym at [employer].    Pertinent History significant arthritis in knees    Patient Stated Goals Pt's goals for therapy are to get better with walking and with arthritis.    Currently in Pain? Yes    Pain Score 6     Pain Location Knee    Pain Orientation Right    Pain Descriptors / Indicators Aching;Discomfort    Pain Type Chronic pain                [facility] PT Assessment - 06/27/21 0001       Assessment   Medical Diagnosis Cerebrovascular accident (CVA) due to embolism of right posterior cerebral artery    Referring Provider (PT) Dohmeier, Asencion Partridge    Onset Date/Surgical Date 12/28/20      [place]rilee Jansky Balance Test   Sit to Stand Able to stand without using hands and stabilize independently    Standing Unsupported Able to stand safely 2 minutes    Sitting with Back Unsupported but Feet Supported on Floor or Stool Able to sit safely and securely 2 minutes    Stand to Sit Controls descent by using hands    Transfers Able to transfer safely, minor use of hands    Standing Unsupported with Eyes Closed Able to stand 10 seconds safely    Standing Unsupported with Feet Together Able to place feet together independently and stand 1 minute safely    From Standing, Reach Forward with Outstretched Arm Can reach forward >12 cm safely (5")    From Standing Position, Pick up Object from Floor Able to pick up shoe safely and easily    From Standing Position, Turn to Look Behind Over each Shoulder Looks behind from both sides and weight shifts well    Turn 360 Degrees Able to turn 360  degrees safely in 4 seconds or less    Standing Unsupported, Alternately Place Feet on Step/Stool Able to stand independently and complete 8 steps >20 seconds    Standing Unsupported, One Foot in [employer] balance while stepping or standing    Standing on One Leg Able to lift leg independently and hold equal to or more than 3 seconds    Total Score 47  Timed Up and Go Test   Normal TUG (seconds) 18.43                           [facility] Adult PT Treatment/Exercise - 06/27/21 0001       Neuro Re-ed    Neuro Re-ed Details  fwd/back stepping over 1/2 foam 5x each with 1 UE support      Knee/Hip Exercises: Seated   Long Arc Quad Strengthening;Right;Left;1 set;15 reps;Limitations    Long Arc Quad Limitations 10x with yellow TB,   good TKE                    PT Education - 06/27/21 1114     Education Details review/consolidation of HEP; discussion on HEP frequency and stationary bike or walking on other days; discussion on progress with PT and remaining impairments; dicussion about recent medical appointments    Person(s) Educated Patient    Methods Explanation;Demonstration;Tactile cues;Verbal cues;Handout    Comprehension Returned demonstration;Verbalized understanding              PT Short Term Goals - 06/27/21 1111       PT SHORT TERM GOAL #1   Title Pt will be independent with HEP for improved strength and flexibility, balance, transfers, and gait.   TARGET 05/26/2021    Time 4    Period Weeks    Status Achieved      PT SHORT TERM GOAL #2   Title Pt will improve Berg score to at least 41/56 to decrease fall risk.    Baseline 36/56    Time 4    Period Weeks    Status Achieved      PT SHORT TERM GOAL #3   Title Pt will improve TUG score to less than or equal to 21 sec for decreased fall risk.    Baseline 26.88 sec    Time 4    Period Weeks    Status Achieved      PT SHORT TERM GOAL #4   Title Pt will verbalize understanding of fall  prevention in home environment.    Time 4    Period Weeks    Status Achieved               PT Long Term Goals - 06/27/21 1111       PT LONG TERM GOAL #1   Title Pt will be independent with HEP for improved flexibility and strength, balance, transfers, and gait.  TARGET 06/23/2021    Time 5    Period Weeks    Status Achieved    Target Date 07/18/21      PT LONG TERM GOAL #2   Title Pt will improve Berg score to at least 46/56 to decrease fall risk.    Time 5    Period Weeks    Status Achieved   47/56   Target Date 07/18/21      PT LONG TERM GOAL #3   Title Pt will improve TUG score to less than or equal to 16 sec for decreased fall risk.    Time 5    Period Weeks    Status On-going   18.43 sec   Target Date 07/18/21      PT LONG TERM GOAL #4   Title Pt will improve gait velocity to at least 2 ft/sec for improved gait efficiency and safety.    Baseline 1.73 ft/sec    Time 9  Period Weeks    Status Achieved   2.69 ft/s     PT LONG TERM GOAL #5   Title Pt will verbalize plans for continued community fitness upon d/c to maximize gains made in PT.    Time 9    Period Weeks    Status Achieved   reports plans to join gym and walk at [employer] - 06/27/21 1111     Clinical Impression Statement Patient arrived to session with report that he is suspected to have Parkinsons vs. Parkinsonism per Neuro. Also reports continued R knee pain which she plans to get a second opinion about and requests to discontinue land PT d/t pain, but requests to continue aquatic PT. Patient expresses plans to return to stationary bike at the gym and continue with HEP. At this time patient has met or partially met all goals. Scored 47/56 on Berg today, indicating a decreased risk of falls and improved from last measurements. Reviewed HEP thoroughly and consolidated for max benefit. Patient reported understanding and without complaints at end of session. At this  time we will discontinue land PT and continue aquatic therapy to address weakness and pain.    Comorbidities CVA due to embolism R PCA; COVID -19, post-viral fatigue syndrome  PMH COVID 11/2020, arthritis, back pain, CAD, cirrhosis, diverticulitis/osis, GERD, HTN, IBS, osteopenia, PAF    PT Treatment/Interventions ADLs/Self Care Home Management;Aquatic Therapy;DME Instruction;Neuromuscular re-education;Balance training;Therapeutic exercise;Therapeutic activities;Functional mobility training;Gait training;Patient/family education;Manual techniques    PT Next Visit Plan Work on standing balance, gait activities. work on timing/coordination with LLE hip/knee flexion with gait.    Consulted and Agree with Plan of Care Patient             Patient will benefit from skilled therapeutic intervention in order to improve the following deficits and impairments:  Abnormal gait, Difficulty walking, Impaired flexibility, Decreased balance, Decreased mobility, Decreased strength, Postural dysfunction, Pain  Visit Diagnosis: Other abnormalities of gait and mobility  Muscle weakness (generalized)  Unsteadiness on feet  Other symptoms and signs involving the musculoskeletal system     Problem List Patient Active Problem List   Diagnosis Date Noted   Left hemiparesis ([place]) 06/21/2021   Other specified hereditary hemolytic anemias ([place]) 06/21/2021   Parkinsonism ([place]) 06/21/2021   COVID-19 02/14/2021   Postviral fatigue syndrome 02/14/2021   Difficulty with adaptive servo-ventilation (ASV) use 02/14/2021   Sepsis secondary to UTI ([place]) 09/08/2020   Elevated ALT measurement 09/08/2020   History of CVA with residual deficit 09/08/2020   Hyperbilirubinemia 09/08/2020   Fatigue associated with anemia 08/03/2020   Cerebrovascular accident (CVA) due to embolism of right posterior cerebral artery ([place]) 08/03/2020   OSA treated with BiPAP 08/03/2020   Complex sleep apnea syndrome 08/03/2020    Treatment-emergent central sleep apnea 08/03/2020   Chronic intermittent hypoxia with obstructive sleep apnea 04/21/2020   OSA (obstructive sleep apnea) 04/21/2020   History of cardioembolic stroke 48/18/5631   Gait disturbance, post-stroke 03/29/2020   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 ([place]) 03/29/2020   Anxiety    RLQ abdominal pain 10/22/2019   Paroxysmal atrial fibrillation ([place]) 05/22/2019   Iron deficiency anemia 05/07/2019   Atrial fibrillation with RVR ([place]) 10/21/2018   Cerebellar stroke, acute ([place]) 10/21/2018   Streptococcal endocarditis    Endocarditis of mitral valve 10/07/2018   Bacteremia due to Streptococcus Salivarius 10/07/2018   Sepsis ([place]) 10/04/2018  Acute metabolic encephalopathy 71/58/0638   Severe aortic stenosis 07/08/2018   S/P TAVR (transcatheter aortic valve replacement) 07/08/2018   Esophageal thickening    CAD in native artery 04/22/2018   Gastroesophageal reflux disease    Pulmonary hypertension ([place]) 02/21/2018   Essential hypertension 07/15/2017   History of colonic polyps 05/22/2017   Elevated liver function tests 12/05/2016   DOE (dyspnea on exertion) 07/19/2016   Thalassemia minor 05/29/2016   Left bundle branch block 12/06/2015   Upper airway cough syndrome 68/54/8830   Diastolic heart failure ([place]) 10/10/2015   Myalgia 02/17/2014   Carotid artery stenosis 06/05/2013   Eustachian tube dysfunction 05/07/2013   Neuropathy of leg 03/07/2012   Anemia, unspecified 01/31/2012   Hot flashes 08/24/2010   Diabetes mellitus due to underlying condition, uncontrolled 06/30/2010   Generalized abdominal pain 03/24/2010   Obesity (BMI 30.0-34.9) 12/21/2009   IBS (irritable bowel syndrome) 04/29/2009   Vitamin D deficiency 03/10/2009   Acute bronchitis 03/07/2009   Hypothyroidism 12/13/2008   Dyslipidemia 12/13/2008   Anxiety state 12/13/2008   Other specified disorders of bladder 12/13/2008    Janene Harvey, PT,  DPT 06/27/21 2:56 PM   [facility] [address], [place], [place], [address] Phone: [phone]   Fax:  [phone]  Name: KEIA RASK MRN: [id number] Date of Birth: 11/02/1945

## 2021-06-27 NOTE — Assessment & Plan Note (Signed)
Cyclical cough rx 3/72/9021 >>> repeat 11/03/2015 as did not follow instructions the first time > resolved  ?- FENO 11/02/2015  =   6 ?- Allergy profile 11/02/2015 >  Eos 0.1 /  IgE  137 dust only  ?- Sinus CT 11/09/2015 > Mucosal thickening inferior right maxillary antrum. Visualized paranasal sinuses elsewhere clear. ?- 12/13/2015 all better off all meds p cyclical cough rx/ gerd rx  ?- Allergy profile 07/19/16 >  Eos 0.2 /  IgE  192 RAST pos dust only ?03/20/17 EGD [name]:  Loose NF, Barrett's   ?- 03/03/18 nl manometry and Ph impedence studies  ?- reports resolved as of 02/11/2019  On max gerd rx  ? ? ?Of the three most common causes of  Sub-acute / recurrent or chronic cough, only one (GERD)  can actually contribute to/ trigger  the other two (asthma and post nasal drip syndrome)  and perpetuate the cylce of cough. ? ?While not intuitively obvious, many patients with chronic low grade reflux do not cough until there is a primary insult that disturbs the protective epithelial barrier and exposes sensitive nerve endings.   This is typically viral but can due to PNDS and  either may apply here.   ? ?>>>  The point is that once this occurs, it is difficult to eliminate the cycle  using anything but a maximally effective acid suppression regimen at least in the short run, accompanied by an appropriate diet to address non acid GERD and control / eliminate the cough itself for at least 3 days with tyl #3 and all pnds with 1st gen H1 blockers per guidelines  If tolerates, o/w ENT eval next step  ? ?    ?  ? ?Each maintenance medication was reviewed in detail including emphasizing most importantly the difference between maintenance and prns and under what circumstances the prns are to be triggered using an action plan format where appropriate. ? ?Total time for H and P, chart review, counseling, ) and generating customized AVS unique to this office visit / same day charting  > 30 min  ?     ? ? ?

## 2021-06-27 NOTE — Therapy (Signed)
[facility] [address] [place], [place], [address] Phone: [phone]   Fax:  [phone]  Occupational Therapy Treatment  Patient Details  Name: Breanna Webster MRN: [id number] Date of Birth: Sep 13, 1945 Referring Provider (OT): Dohmeier, Asencion Partridge, MD   Encounter Date: 06/27/2021   OT End of Session - 06/27/21 1107     Visit Number 6    Number of Visits 17    Date for OT Re-Evaluation 07/21/21    Authorization Type Medicare A and B    Authorization - Visit Number 6    Authorization - Number of Visits 17    Progress Note Due on Visit 10    OT Start Time 1103    OT Stop Time 1145    OT Time Calculation (min) 42 min    Activity Tolerance Patient tolerated treatment well    Behavior During Therapy [facility] for tasks assessed/performed             Past Medical History:  Diagnosis Date   Anxiety    Arthritis    "back" (04/22/2018)   Back pain    Blood transfusion without reported diagnosis    CAD (coronary artery disease)    a. 03/2018 s/p PCI/DES to the RCA (3.0x15 Onyx DES).   Carotid artery stenosis    Mild   Chest pain    Chronic lower back pain    Cirrhosis (HCC)    Colon polyps    Diverticulitis    Diverticulosis    Esophageal thickening    seen on pre TAVR CT scan, also questionable cirrhosis. MRI recommended. Will refer to GI   Fatty liver    GERD (gastroesophageal reflux disease)    Grave's disease    History of colonic polyps 05/22/2017   History of hiatal hernia    Hypertension    Hypothyroidism    IBS (irritable bowel syndrome)    Osteopenia    Pulmonary nodules    seen on pre TAVR CT. likley benign. no follow up recommended if pt low risk.   S/P TAVR (transcatheter aortic valve replacement)    Severe aortic stenosis    Shortness of breath on exertion    Stroke ([place])    Thalassemia minor    Thyroid disease    Type II diabetes mellitus ([place])     Past Surgical History:  Procedure Laterality Date    57 HOUR [place] STUDY N/A 03/03/2018   Procedure: 24 HOUR PH STUDY;  Surgeon: Mauri Pole, MD;  Location: [facility];  Service: Endoscopy;  Laterality: N/A;   AORTIC VALVE REPLACEMENT  06/2018   COLONOSCOPY     COLONOSCOPY W/ BIOPSIES AND POLYPECTOMY     CORONARY ANGIOGRAPHY Right 04/21/2018   Procedure: CORONARY ANGIOGRAPHY (CATH LAB);  Surgeon: [name], MD;  Location: [facility];  Service: Cardiovascular;  Laterality: Right;   CORONARY STENT INTERVENTION N/A 04/22/2018   Procedure: CORONARY STENT INTERVENTION;  Surgeon: [name], MD;  Location: [place] [facility];  Service: Cardiovascular;  Laterality: N/A;   DILATION AND CURETTAGE OF UTERUS     ESOPHAGEAL MANOMETRY N/A 03/03/2018   Procedure: ESOPHAGEAL MANOMETRY (EM);  Surgeon: Mauri Pole, MD;  Location: [facility];  Service: Endoscopy;  Laterality: N/A;   GASTRIC FUNDOPLICATION     HERNIA REPAIR     HYSTEROSCOPY     fibroids   LAPAROSCOPIC CHOLECYSTECTOMY     LAPAROSCOPY     fibroids   NISSEN  FUNDOPLICATION  [id number]   POLYPECTOMY     RIGHT/LEFT HEART CATH AND CORONARY ANGIOGRAPHY N/A 02/20/2018   Procedure: RIGHT/LEFT HEART CATH AND CORONARY ANGIOGRAPHY;  Surgeon: [name], MD;  Location: [place] [facility];  Service: Cardiovascular;  Laterality: N/A;   TEE WITHOUT CARDIOVERSION N/A 07/08/2018   Procedure: TRANSESOPHAGEAL ECHOCARDIOGRAM (TEE);  Surgeon: Burnell Blanks, MD;  Location: [facility];  Service: Open Heart Surgery;  Laterality: N/A;   TEE WITHOUT CARDIOVERSION  10/07/2018   TEE WITHOUT CARDIOVERSION N/A 10/07/2018   Procedure: TRANSESOPHAGEAL ECHOCARDIOGRAM (TEE);  Surgeon: Jerline Pain, MD;  Location: [facility] ENDOSCOPY;  Service: Cardiovascular;  Laterality: N/A;   TONSILLECTOMY     TRANSCATHETER AORTIC VALVE REPLACEMENT, TRANSFEMORAL N/A 07/08/2018   Procedure: TRANSCATHETER AORTIC VALVE REPLACEMENT, TRANSFEMORAL;  Surgeon: Burnell Blanks, MD;  Location: [facility];   Service: Open Heart Surgery;  Laterality: N/A;    There were no vitals filed for this visit.   Subjective Assessment - 06/27/21 1105     Subjective  Pt reports taking a trial of the levodopa and reports no changes in tremors, but noting increased swelling in BLE and nausea    Pertinent History DM2, CAD, HTN, aortic stenosis status post TAVR in 3/20, hypothyroidism    Limitations significant arthritis in knees    Patient Stated Goals to be able to get back in to the swing of things    Currently in Pain? Yes    Pain Score 6     Pain Location Knee    Pain Orientation Right    Pain Descriptors / Indicators Aching;Discomfort    Pain Onset More than a month ago                [facility] OT Assessment - 06/27/21 1519       Coordination   Left 9 Hole Peg Test 45.28   55.4 on eval     Hand Function   Right Hand Grip (lbs) 45    Left Hand Grip (lbs) 35            Grip strength: digiflex (light) with focus on finger strengthening and grip strengthening in sequence.  Pt continues to demonstrate decreased motor control and strength on LUE compared to RUE.  Functional grasp: Pt continues to demonstrate tremors in LUE when holding cup or bowl.  Educated on resting tremor and recommendation for whole hand grasp (contact of thumb webspace on cup) and gentle grasp/release on cup while maintaining grasp to make movement more active in nature.  Pt demonstrating decreased tremor when maintaining grasp on cup with increased activation during grasp.  Pt encouraged to attempt to make functional tasks more active in nature as tremor decreases with movement and is present at rest.                      OT Short Term Goals - 06/27/21 1111       OT SHORT TERM GOAL #1   Title Pt will be independent in [facility] HEP to increase coordination as needed for managing clothing fasteners.   reports doing exercises "sometimes"   Time 4    Period Weeks    Status Partially Met    Target Date  06/23/21      OT SHORT TERM GOAL #2   Title Pt will demonstrate improved fine motor coordination for ADLs as evidenced by decreasing 9 hole peg test score for LUE by 10 secs   L: 45.28  Baseline R: 35.5 and L: 55.4    Time 4    Period Weeks    Status Achieved      OT SHORT TERM GOAL #3   Title Pt will demonstrate increased grip strength in LUE by 5# as needed for opening containers   35#   Baseline R: 43 and L: 30    Time 4    Period Weeks    Status Achieved               OT Long Term Goals - 06/09/21 1114       OT LONG TERM GOAL #1   Title Pt will be independent in completing a full body/routine HEP as needed for increased balance and LB flexibility to increase independence with LB dressing.    Time 8    Period Weeks    Status On-going    Target Date 07/21/21      OT LONG TERM GOAL #2   Title Pt will demonstrate improved fine motor coordination for ADLs as evidenced by decreasing 9 hole peg test score for LUE by 15 secs    Baseline R: 35.5 and L: 55.4    Time 8    Period Weeks    Status On-going      OT LONG TERM GOAL #3   Title Pt will demonstrate improved UE functional use for ADLs as evidenced by increasing box/ blocks score by 5 blocks with LUE.    Baseline R; 41 and L: 31    Time 8    Period Weeks    Status On-going      OT LONG TERM GOAL #4   Title Pt will be independent in energy conservation strategies to increase safety and decrease fall risk.    Time 8    Period Weeks    Status On-going                   Plan - 06/27/21 1109     Clinical Impression Statement Pt expressing desire to hold off on further OT sessions until she has a more clear diagnosis.  Pt is frustrated by perceived lack of progress, however demonstrating improvements in coordination and strength during assessment this session.  Pt continues to voice frustation with resting tremor in L hand.  Pt attempting large, full grasp on cup and bowl to increase contact with container  and receptive to cues for active engagement while holding item to decrease tremors.  Pt benefits from continued cues to keep grasp active in nature to decrease tremors.    OT Occupational Profile and History Detailed Assessment- Review of Records and additional review of physical, cognitive, psychosocial history related to current functional performance    Occupational performance deficits (Please refer to evaluation for details): ADL's;IADL's    Body Structure / Function / Physical Skills ADL;Balance;Body mechanics;Coordination;Decreased knowledge of use of DME;Dexterity;Endurance;FMC;GMC;Flexibility;Mobility;Pain;ROM;Strength;UE functional use    Rehab Potential Good    Clinical Decision Making Limited treatment options, no task modification necessary    Comorbidities Affecting Occupational Performance: May have comorbidities impacting occupational performance    Modification or Assistance to Complete Evaluation  No modification of tasks or assist necessary to complete eval    OT Frequency 2x / week    OT Duration 8 weeks    OT Treatment/Interventions Self-care/ADL training;Aquatic Therapy;Biofeedback;Cryotherapy;Electrical Stimulation;Moist Heat;Ultrasound;Therapeutic exercise;Neuromuscular education;Energy conservation;DME and/or AE instruction;Functional Mobility Training;Manual Therapy;Passive range of motion;Therapeutic activities;Patient/family education;Balance training;Coping strategies training    Plan hold on therapy until more firm  diagnosis    OT Home Exercise Plan OT toolkit UB theraband exercises    Consulted and Agree with Plan of Care Patient             Patient will benefit from skilled therapeutic intervention in order to improve the following deficits and impairments:   Body Structure / Function / Physical Skills: ADL, Balance, Body mechanics, Coordination, Decreased knowledge of use of DME, Dexterity, Endurance, FMC, GMC, Flexibility, Mobility, Pain, ROM, Strength, UE  functional use       Visit Diagnosis: Muscle weakness (generalized)  Unsteadiness on feet  Other symptoms and signs involving the musculoskeletal system  Other lack of coordination    Problem List Patient Active Problem List   Diagnosis Date Noted   Left hemiparesis ([place]) 06/21/2021   Other specified hereditary hemolytic anemias ([place]) 06/21/2021   Parkinsonism ([place]) 06/21/2021   COVID-19 02/14/2021   Postviral fatigue syndrome 02/14/2021   Difficulty with adaptive servo-ventilation (ASV) use 02/14/2021   Sepsis secondary to UTI ([place]) 09/08/2020   Elevated ALT measurement 09/08/2020   History of CVA with residual deficit 09/08/2020   Hyperbilirubinemia 09/08/2020   Fatigue associated with anemia 08/03/2020   Cerebrovascular accident (CVA) due to embolism of right posterior cerebral artery ([place]) 08/03/2020   OSA treated with BiPAP 08/03/2020   Complex sleep apnea syndrome 08/03/2020   Treatment-emergent central sleep apnea 08/03/2020   Chronic intermittent hypoxia with obstructive sleep apnea 04/21/2020   OSA (obstructive sleep apnea) 04/21/2020   History of cardioembolic stroke 01/60/1093   Gait disturbance, post-stroke 03/29/2020   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 ([place]) 03/29/2020   Anxiety    RLQ abdominal pain 10/22/2019   Paroxysmal atrial fibrillation ([place]) 05/22/2019   Iron deficiency anemia 05/07/2019   Atrial fibrillation with RVR ([place]) 10/21/2018   Cerebellar stroke, acute ([place]) 10/21/2018   Streptococcal endocarditis    Endocarditis of mitral valve 10/07/2018   Bacteremia due to Streptococcus Salivarius 10/07/2018   Sepsis ([place]) 23/55/7322   Acute metabolic encephalopathy 02/54/2706   Severe aortic stenosis 07/08/2018   S/P TAVR (transcatheter aortic valve replacement) 07/08/2018   Esophageal thickening    CAD in native artery 04/22/2018   Gastroesophageal reflux disease    Pulmonary hypertension ([place]) 02/21/2018   Essential  hypertension 07/15/2017   History of colonic polyps 05/22/2017   Elevated liver function tests 12/05/2016   DOE (dyspnea on exertion) 07/19/2016   Thalassemia minor 05/29/2016   Left bundle branch block 12/06/2015   Upper airway cough syndrome 23/76/2831   Diastolic heart failure ([place]) 10/10/2015   Myalgia 02/17/2014   Carotid artery stenosis 06/05/2013   Eustachian tube dysfunction 05/07/2013   Neuropathy of leg 03/07/2012   Anemia, unspecified 01/31/2012   Hot flashes 08/24/2010   Diabetes mellitus due to underlying condition, uncontrolled 06/30/2010   Generalized abdominal pain 03/24/2010   Obesity (BMI 30.0-34.9) 12/21/2009   IBS (irritable bowel syndrome) 04/29/2009   Vitamin D deficiency 03/10/2009   Acute bronchitis 03/07/2009   Hypothyroidism 12/13/2008   Dyslipidemia 12/13/2008   Anxiety state 12/13/2008   Other specified disorders of bladder 12/13/2008    Simonne Come, OT 06/27/2021, 3:20 PM  [facility] [address] [place], [place], [address] Phone: [phone]   Fax:  [phone]  Name: Breanna Webster MRN: [id number] Date of Birth: Jun 27, 1945

## 2021-06-29 ENCOUNTER — Encounter: Payer: Self-pay | Admitting: Adult Health

## 2021-06-29 ENCOUNTER — Other Ambulatory Visit: Payer: Self-pay

## 2021-06-29 ENCOUNTER — Ambulatory Visit: Payer: Medicare Other | Admitting: Occupational Therapy

## 2021-06-29 ENCOUNTER — Ambulatory Visit (INDEPENDENT_AMBULATORY_CARE_PROVIDER_SITE_OTHER): Payer: Medicare Other

## 2021-06-29 ENCOUNTER — Ambulatory Visit (INDEPENDENT_AMBULATORY_CARE_PROVIDER_SITE_OTHER): Payer: Medicare Other | Admitting: Adult Health

## 2021-06-29 ENCOUNTER — Other Ambulatory Visit (HOSPITAL_BASED_OUTPATIENT_CLINIC_OR_DEPARTMENT_OTHER): Payer: Self-pay

## 2021-06-29 VITALS — BP 130/60 | HR 73 | Temp 97.8°F | Ht 65.0 in | Wt 178.0 lb

## 2021-06-29 DIAGNOSIS — I2510 Atherosclerotic heart disease of native coronary artery without angina pectoris: Secondary | ICD-10-CM | POA: Diagnosis not present

## 2021-06-29 DIAGNOSIS — M79672 Pain in left foot: Secondary | ICD-10-CM | POA: Diagnosis not present

## 2021-06-29 DIAGNOSIS — R600 Localized edema: Secondary | ICD-10-CM | POA: Diagnosis not present

## 2021-06-29 DIAGNOSIS — B353 Tinea pedis: Secondary | ICD-10-CM

## 2021-06-29 NOTE — Progress Notes (Signed)
Subjective:    Patient ID: Breanna Webster, female    DOB: 1945/04/25, 76 y.o.   MRN: [id number]  HPI  76 year old female who  has a past medical history of Anxiety, Arthritis, Back pain, Blood transfusion without reported diagnosis, CAD (coronary artery disease), Carotid artery stenosis, Chest pain, Chronic lower back pain, Cirrhosis (HCC), Colon polyps, Diverticulitis, Diverticulosis, Esophageal thickening, Fatty liver, GERD (gastroesophageal reflux disease), Grave's disease, History of colonic polyps (05/22/2017), History of hiatal hernia, Hypertension, Hypothyroidism, IBS (irritable bowel syndrome), Osteopenia, Pulmonary nodules, S/P TAVR (transcatheter aortic valve replacement), Severe aortic stenosis, Shortness of breath on exertion, Stroke (HCC), Thalassemia minor, Thyroid disease, and Type II diabetes mellitus (HCC).  She presents to the office today for bilateral lower extremity edema.  She reports that the lower extremity edema started about a week ago after she was started on levodopa by neurology for left upper extremity tremor.  Per patient "they are trying to find out if I have Parkinson's".  Her edema has been mild and does not appear to be pitting.   Additionally she complains of left foot pain/burning, more specifically on toes 3 4 and 5.  She reports having a fall back in December and fears that she may have injured her left foot.  She is also noticed peeling skin on these toes and at home has been placing antifungal cream on them.  Toes hurt more when she wear shoes.  Review of Systems See HPI   Past Medical History:  Diagnosis Date   Anxiety    Arthritis    "back" (04/22/2018)   Back pain    Blood transfusion without reported diagnosis    CAD (coronary artery disease)    a. 03/2018 s/p PCI/DES to the RCA (3.0x15 Onyx DES).   Carotid artery stenosis    Mild   Chest pain    Chronic lower back pain    Cirrhosis (HCC)    Colon polyps    Diverticulitis     Diverticulosis    Esophageal thickening    seen on pre TAVR CT scan, also questionable cirrhosis. MRI recommended. Will refer to GI   Fatty liver    GERD (gastroesophageal reflux disease)    Grave's disease    History of colonic polyps 05/22/2017   History of hiatal hernia    Hypertension    Hypothyroidism    IBS (irritable bowel syndrome)    Osteopenia    Pulmonary nodules    seen on pre TAVR CT. likley benign. no follow up recommended if pt low risk.   S/P TAVR (transcatheter aortic valve replacement)    Severe aortic stenosis    Shortness of breath on exertion    Stroke (HCC)    Thalassemia minor    Thyroid disease    Type II diabetes mellitus (HCC)     Social History   Socioeconomic History   Marital status: Married    Spouse name: Not on file   Number of children: 2   Years of education: Not on file   Highest education level: Not on file  Occupational History   Occupation: Health and safety inspector of the [employer]  Tobacco Use   Smoking status: Never   Smokeless tobacco: Never  Vaping Use   Vaping Use: Never used  Substance and Sexual Activity   Alcohol use: No    Alcohol/week: 0.0 standard drinks   Drug use: Never   Sexual activity: Not Currently  Other Topics Concern   Not on file  Social History Narrative   Lives with husband in a one story home.     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   Education: college.   Social Determinants of Health   Financial Resource Strain: Low Risk    Difficulty of Paying Living Expenses: Not hard at all  Food Insecurity: No Food Insecurity   Worried About Programme researcher, broadcasting/film/video in the Last Year: Never true   Ran Out of Food in the Last Year: Never true  Transportation Needs: No Transportation Needs   Lack of Transportation (Medical): No   Lack of Transportation (Non-Medical): No  Physical Activity: Inactive   Days of Exercise per Week: 0 days    Minutes of Exercise per Session: 0 min  Stress: No Stress Concern Present   Feeling of Stress : Not at all  Social Connections: Socially Integrated   Frequency of Communication with Friends and Family: More than three times a week   Frequency of Social Gatherings with Friends and Family: More than three times a week   Attends Religious Services: More than 4 times per year   Active Member of Clubs or Organizations: Yes   Attends Engineer, structural: More than 4 times per year   Marital Status: Married  Catering manager Violence: Not At Risk   Fear of Current or Ex-Partner: No   Emotionally Abused: No   Physically Abused: No   Sexually Abused: No    Past Surgical History:  Procedure Laterality Date   87 HOUR PH STUDY N/A 03/03/2018   Procedure: 24 HOUR PH STUDY;  Surgeon: Napoleon Form, MD;  Location: [facility];  Service: Endoscopy;  Laterality: N/A;   AORTIC VALVE REPLACEMENT  06/2018   COLONOSCOPY     COLONOSCOPY W/ BIOPSIES AND POLYPECTOMY     CORONARY ANGIOGRAPHY Right 04/21/2018   Procedure: CORONARY ANGIOGRAPHY (CATH LAB);  Surgeon: Lyn Records, MD;  Location: [facility] INVASIVE CV LAB;  Service: Cardiovascular;  Laterality: Right;   CORONARY STENT INTERVENTION N/A 04/22/2018   Procedure: CORONARY STENT INTERVENTION;  Surgeon: Lyn Records, MD;  Location: [facility] INVASIVE CV LAB;  Service: Cardiovascular;  Laterality: N/A;   DILATION AND CURETTAGE OF UTERUS     ESOPHAGEAL MANOMETRY N/A 03/03/2018   Procedure: ESOPHAGEAL MANOMETRY (EM);  Surgeon: Napoleon Form, MD;  Location: [facility];  Service: Endoscopy;  Laterality: N/A;   GASTRIC FUNDOPLICATION     HERNIA REPAIR     HYSTEROSCOPY     fibroids   LAPAROSCOPIC CHOLECYSTECTOMY     LAPAROSCOPY     fibroids   NISSEN FUNDOPLICATION  1990s   POLYPECTOMY     RIGHT/LEFT HEART CATH AND CORONARY ANGIOGRAPHY N/A 02/20/2018   Procedure: RIGHT/LEFT HEART CATH AND CORONARY ANGIOGRAPHY;  Surgeon: Lyn Records,  MD;  Location: [facility] INVASIVE CV LAB;  Service: Cardiovascular;  Laterality: N/A;   TEE WITHOUT CARDIOVERSION N/A 07/08/2018   Procedure: TRANSESOPHAGEAL ECHOCARDIOGRAM (TEE);  Surgeon: Kathleene Hazel, MD;  Location[facility] OR;  Service: Open Heart Surgery;  Laterality: N/A;   TEE WITHOUT CARDIOVERSION  10/07/2018   TEE WITHOUT CARDIOVERSION N/A 10/07/2018   Procedure: TRANSESOPHAGEAL ECHOCARDIOGRAM (TEE);  Surgeon: Jake Bathe, MD;  Location: [facility] ENDOSCOPY;  Service: Cardiovascular;  Laterality: N/A;   TONSILLECTOMY     TRANSCATHETER AORTIC VALVE REPLACEMENT, TRANSFEMORAL N/A 07/08/2018   Procedure: TRANSCATHETER AORTIC VALVE REPLACEMENT, TRANSFEMORAL;  Surgeon: Kathleene Hazel, MD;  Location: [facility]  OR;  Service: Open Heart Surgery;  Laterality: N/A;    Family History  Adopted: Yes  Problem Relation Age of Onset   Healthy Son        x 2   Headache Other        Cluster headaches   Heart failure Mother    Colon cancer Neg Hx    Pancreatic cancer Neg Hx    Rectal cancer Neg Hx    Stomach cancer Neg Hx     Allergies  Allergen Reactions   Statins Other (See Comments)    Muscle aches    Current Outpatient Medications on File Prior to Visit  Medication Sig Dispense Refill   acetaminophen-codeine (TYLENOL #3) 300-30 MG tablet Take 1 tablet by mouth every 4 (four) hours as needed for up to 5 days (cough). 30 tablet 0   apixaban (ELIQUIS) 5 MG TABS tablet TAKE ONE TABLET BY MOUTH TWICE A DAY 60 tablet 2   b complex vitamins capsule Take 1 capsule by mouth daily.     BD PEN NEEDLE NANO 2ND GEN 32G X 4 MM MISC      Blood Glucose Monitoring Suppl (ONE TOUCH ULTRA 2) w/Device KIT Use to monitor blood glucose 1 kit 0   Blood Glucose Monitoring Suppl (ONETOUCH VERIO FLEX SYSTEM) w/Device KIT Use as directed 3 times daily 1 kit 0   carbidopa-levodopa (SINEMET) 25-100 MG tablet Take 1 tablet by mouth 2 (two) times daily. 60 tablet 2   cholecalciferol (VITAMIN D) 25 MCG (1000 UNIT)  tablet Take 1,000 Units by mouth daily.     diltiazem (CARDIZEM CD) 120 MG 24 hr capsule Take 1 capsule (120 mg total) by mouth daily. 90 capsule 1   escitalopram (LEXAPRO) 20 MG tablet TAKE ONE TABLET BY MOUTH DAILY 90 tablet 1   estradiol (ESTRACE) 0.1 MG/GM vaginal cream Place vaginally.     estradiol (ESTRACE) 0.1 MG/GM vaginal cream Place 1 applicatorful into the vagina 2 nights per week 42.5 g 3   Evolocumab (REPATHA SURECLICK) 140 MG/ML SOAJ Inject 1 dose into the skin every 14 (fourteen) days. 2 mL 11   famotidine (PEPCID) 20 MG tablet Take 1 tablet by mouth every night at bedtime     glucose blood (ONETOUCH VERIO) test strip USE TO CHECK BLOOD SUGAR THREE TIMES DAILY 300 strip 4   glucose blood test strip Use to check blood sugar 3 times daily 300 each 3   Insulin Pen Needle (UNIFINE PENTIPS) 32G X 4 MM MISC Inject 4 times subcutaneously 400 each 2   Insulin Pen Needle 32G X 4 MM MISC Use to inject 4 times daily 400 each 2   Insulin Syringe-Needle U-100 (ULTICARE INSULIN SYRINGE) 31G X 5/16" 1 ML MISC FOUR TIMES A DAY 200 each 1   Insulin Syringe-Needle U-100 (ULTICARE INSULIN SYRINGE) 31G X 5/16" 1 ML MISC Inject 4 times daily subcutaneously 100 each 1   Insulin Syringe-Needle U-100 31G X 5/16" 1 ML MISC Use for injections 4 times daily 200 each 1   Lancets (ONETOUCH DELICA PLUS LANCET33G) MISC Use 1 lancet 2 times daily 200 each 1   Lancets (ONETOUCH DELICA PLUS LANCET33G) MISC Use 1 lancet 2 times daily 200 each 0   Lancets (ONETOUCH DELICA PLUS LANCET33G) MISC Use twice daily 200 each 1   levothyroxine (SYNTHROID) 125 MCG tablet Take 1 tablet by mouth every morning on an empty stomach 30 tablet 7   levothyroxine (SYNTHROID) 137 MCG tablet Take 137 mcg by  mouth once.     levothyroxine (SYNTHROID) 137 MCG tablet Take 1 tablet by mouth every morning on an empty stomach 30 tablet 3   losartan (COZAAR) 50 MG tablet TAKE ONE TABLET BY MOUTH DAILY 90 tablet 1   metFORMIN (GLUCOPHAGE-XR)  500 MG 24 hr tablet Take 500 mg by mouth 2 (two) times daily.     metFORMIN (GLUCOPHAGE-XR) 500 MG 24 hr tablet Take 1 tablet by mouth 2 times daily 180 tablet 2   mirabegron ER (MYRBETRIQ) 50 MG TB24 tablet Take 50 mg by mouth daily.     mirabegron ER (MYRBETRIQ) 50 MG TB24 tablet Take 1 tablet by mouth once daily. 30 tablet 11   Multiple Vitamin (MULITIVITAMIN WITH MINERALS) TABS Take 1 tablet by mouth daily.     nitroGLYCERIN (NITROSTAT) 0.4 MG SL tablet Place 1 tablet (0.4 mg total) under the tongue every 5 (five) minutes as needed for chest pain. 25 tablet 3   NOVOLIN N 100 UNIT/ML injection Inject 28 Units into the skin daily before breakfast.     NOVOLIN R 100 UNIT/ML injection 8 Units 2 (two) times daily before a meal. 10 UNITS THIRTY MINUTES BEFORE MEALS.  5 ADDITIONAL UNITS WITH CARBS OR SNACKS. (04/26/2021:  Pt reports she only takes the 8 units 2x/daily before meals, not the 10 units)     omeprazole (PRILOSEC) 20 MG capsule Take 1 capsule (20 mg total) by mouth 2 (two) times daily before a meal. 60 capsule 3   OneTouch Delica Lancets 30G MISC Tests BS 2 times     No current facility-administered medications on file prior to visit.    BP 130/60   Pulse 73   Temp 97.8 F (36.6 C) (Oral)   Ht 5\' 5"  (1.651 m)   Wt 178 lb (80.7 kg)   SpO2 98%   BMI 29.62 kg/m       Objective:   Physical Exam Vitals and nursing note reviewed.  Constitutional:      Appearance: Normal appearance.  Musculoskeletal:        General: Normal range of motion.     Right lower leg: Edema present.     Left lower leg: Edema present.       Feet:     Comments: Trace nonpitting edema and located around bilateral ankles.  Feet:     Comments: Purpleish discoloration noted to toes 3 4 and 5 on left foot.  She does have apparent fungal disease noted.  No apparent pain with palpation. Neurological:     General: No focal deficit present.     Mental Status: She is alert and oriented to person, place, and  time.     Motor: Tremor (LUE) present.  Psychiatric:        Mood and Affect: Mood normal.        Behavior: Behavior normal.        Thought Content: Thought content normal.        Judgment: Judgment normal.      Assessment & Plan:  1. Lower extremity edema -Likely due to levodopa.  Encouraged compression socks or elevation of legs at night  2. Athlete's foot on left -Likely cause of discomfort and discoloration.  She would feel much better if we did an x-ray to make sure she did not have any broken toes or bones in her feet  3. Left foot pain  - DG Foot Complete Left; Future  Shirline Frees, NP

## 2021-06-29 NOTE — Patient Instructions (Signed)
It was great seeing you today  ? ?The swelling in your ankles is from the levodopa  ? ?I will check an xray of your foot to see if you broke anything from the fall  ? ?Zeosorb powder or  Lamisil powder if great for athletes foot  ? ?

## 2021-06-30 ENCOUNTER — Other Ambulatory Visit (HOSPITAL_BASED_OUTPATIENT_CLINIC_OR_DEPARTMENT_OTHER): Payer: Self-pay

## 2021-07-03 ENCOUNTER — Other Ambulatory Visit: Payer: Self-pay

## 2021-07-03 ENCOUNTER — Ambulatory Visit: Payer: Medicare Other | Admitting: Physical Therapy

## 2021-07-03 DIAGNOSIS — R29898 Other symptoms and signs involving the musculoskeletal system: Secondary | ICD-10-CM | POA: Diagnosis not present

## 2021-07-03 DIAGNOSIS — R2689 Other abnormalities of gait and mobility: Secondary | ICD-10-CM | POA: Diagnosis not present

## 2021-07-03 DIAGNOSIS — R2681 Unsteadiness on feet: Secondary | ICD-10-CM | POA: Diagnosis not present

## 2021-07-03 DIAGNOSIS — M6281 Muscle weakness (generalized): Secondary | ICD-10-CM | POA: Diagnosis not present

## 2021-07-03 DIAGNOSIS — R278 Other lack of coordination: Secondary | ICD-10-CM | POA: Diagnosis not present

## 2021-07-04 ENCOUNTER — Encounter: Payer: BLUE CROSS/BLUE SHIELD | Admitting: Occupational Therapy

## 2021-07-04 ENCOUNTER — Encounter: Payer: Self-pay | Admitting: Physical Therapy

## 2021-07-04 ENCOUNTER — Ambulatory Visit: Payer: Medicare Other | Admitting: Physical Therapy

## 2021-07-04 NOTE — Therapy (Signed)
[facility] [facility], [place], [address] ?Phone: [phone]   Fax:  [phone] ? ?Physical Therapy Treatment ? ?Patient Details  ?Name: Breanna Webster ?MRN: [id number] ?Date of Birth: 1945-09-15 ?Referring Provider (PT): Dohmeier, Asencion Partridge ? ? ?Encounter Date: 07/03/2021 ? ? PT End of Session - 07/04/21 1907   ? ? Visit Number 17   ? Number of Visits 23   ? Date for PT Re-Evaluation 07/18/21   ? Authorization Type Medicare/Generic Commercial   ? Progress Note Due on Visit 20   ? PT Start Time 1400   ? PT Stop Time 2094   ? PT Time Calculation (min) 45 min   ? Equipment Utilized During Treatment Gait belt   ? Activity Tolerance Patient tolerated treatment well   ? Behavior During Therapy [facility] for tasks assessed/performed   ? ?  ?  ? ?  ? ? ?Past Medical History:  ?Diagnosis Date  ? Anxiety   ? Arthritis   ? "back" (04/22/2018)  ? Back pain   ? Blood transfusion without reported diagnosis   ? CAD (coronary artery disease)   ? a. 03/2018 s/p PCI/DES to the RCA (3.0x15 Onyx DES).  ? Carotid artery stenosis   ? Mild  ? Chest pain   ? Chronic lower back pain   ? Cirrhosis ([place])   ? Colon polyps   ? Diverticulitis   ? Diverticulosis   ? Esophageal thickening   ? seen on pre TAVR CT scan, also questionable cirrhosis. MRI recommended. Will refer to GI  ? Fatty liver   ? GERD (gastroesophageal reflux disease)   ? Grave's disease   ? History of colonic polyps 05/22/2017  ? History of hiatal hernia   ? Hypertension   ? Hypothyroidism   ? IBS (irritable bowel syndrome)   ? Osteopenia   ? Pulmonary nodules   ? seen on pre TAVR CT. likley benign. no follow up recommended if pt low risk.  ? S/P TAVR (transcatheter aortic valve replacement)   ? Severe aortic stenosis   ? Shortness of breath on exertion   ? Stroke [facility])   ? Thalassemia minor   ? Thyroid disease   ? Type II diabetes mellitus ([place])   ? ? ?Past Surgical History:  ?Procedure Laterality Date  ? 39  HOUR [place] STUDY N/A 03/03/2018  ? Procedure: [place];  Surgeon: Mauri Pole, MD;  Location: [facility] ENDOS[facility];  Service: Endoscopy;  Laterality: N/A;  ? AORTIC VALVE REPLACEMENT  06/2018  ? COLONOSCOPY    ? COLONOSCOPY W/ BIOPSIES AND POLYPECTOMY    ? CORONARY ANGIOGRAPHY Right 04/21/2018  ? Procedure: CORONARY ANGIOGRAPHY (CATH LAB);  Surgeon: [name], MD;  Location: [place] CV LAB;  Service: Cardiovascular;  Laterality: Right;  ? CORONARY STENT INTERVENTION N/A 04/22/2018  ? Procedure: CORONARY STENT INTERVENTION;  Surgeon: [name], MD;  Location: [place] CV LAB;  Service: Cardiovascular;  Laterality: N/A;  ? DILATION AND CURETTAGE OF UTERUS    ? ESOPHAGEAL MANOMETRY N/A 03/03/2018  ? Procedure: ESOPHAGEAL MANOMETRY (EM);  Surgeon: Mauri Pole, MD;  Location: [facility];  Service: Endoscopy;  Laterality: N/A;  ? GASTRIC FUNDOPLICATION    ? HERNIA REPAIR    ? HYSTEROSCOPY    ? fibroids  ? LAPAROSCOPIC CHOLECYSTECTOMY    ? LAPAROSCOPY    ? fibroids  ? NISSEN FUNDOPLICATION  7096G  ? POLYPECTOMY    ? RIGHT/LEFT HEART CATH  AND CORONARY ANGIOGRAPHY N/A 02/20/2018  ? Procedure: RIGHT/LEFT HEART CATH AND CORONARY ANGIOGRAPHY;  Surgeon: [name], MD;  Location: [place] CV LAB;  Service: Cardiovascular;  Laterality: N/A;  ? TEE WITHOUT CARDIOVERSION N/A 07/08/2018  ? Procedure: TRANSESOPHAGEAL ECHOCARDIOGRAM (TEE);  Surgeon: Burnell Blanks, MD;  Location: [facility];  Service: Open Heart Surgery;  Laterality: N/A;  ? TEE WITHOUT CARDIOVERSION  10/07/2018  ? TEE WITHOUT CARDIOVERSION N/A 10/07/2018  ? Procedure: TRANSESOPHAGEAL ECHOCARDIOGRAM (TEE);  Surgeon: Jerline Pain, MD;  Location: [facility] ENDOSCOPY;  Service: Cardiovascular;  Laterality: N/A;  ? TONSILLECTOMY    ? TRANSCATHETER AORTIC VALVE REPLACEMENT, TRANSFEMORAL N/A 07/08/2018  ? Procedure: TRANSCATHETER AORTIC VALVE REPLACEMENT, TRANSFEMORAL;  Surgeon: Burnell Blanks, MD;  Location: [facility];  Service:  Open Heart Surgery;  Laterality: N/A;  ? ? ?There were no vitals filed for this visit. ? ? Subjective Assessment - 07/04/21 1904   ? ? Subjective Pt reports she has stopped the land PT and wants to continue aquatic therapy - has much less knee pain in the pool than what she has on land; states the Sinemet medication is not helping the tremors in her left hand at all   ? Pertinent History significant arthritis in knees   ? Patient Stated Goals Pt's goals for therapy are to get better with walking and with arthritis.   ? Currently in Pain? Yes   ? Pain Score 4    ? Pain Location Knee   ? Pain Orientation Right   ? Pain Descriptors / Indicators Aching;Discomfort   ? Pain Type Chronic pain   ? Pain Onset More than a month ago   ? Pain Frequency Intermittent   ? ?  ?  ? ?  ? ? ? ? ? ? ? ? ? ? ?Aquatic therapy at [facility] - pool temp 90 degrees ? ?Patient seen for aquatic therapy today.  Treatment took place in water 3.5-4.5 feet deep depending upon activity.  Pt entered & exited the pool  ?the pool via step negotiation using step by step sequence for descension and ascension with use of hand rails with supervision ? ?Pt performed water walking 18' x 4 reps forwards across pool, using bar bells initially for approx. 3 reps and then without barbells to work on balance with gait; 18' x 2 reps with UE support with use of bar bells;  18' x 2 reps backwards across width of pool with UE support on bar bells ? ?Pt performed marching in place 10 reps with bil. UE support on pool edge prn;  marching forwards 18' x 2 reps with use of bar bells  ? ?Pt performed standing balance exercises with UE support on edge of pool - marching in place 10 reps each leg; ? ?Pt performed alternate tap ups to aquatic step 10 reps each leg with minimal UE support on pool edge; performed 2 reps each leg without UE support with close SBA from PT ? ?Pt performed trunk rotation with use of large yellow noodle 10 reps to each side with  WBOS; performed shoulder horizontal abduction/adduction with use of bar bells 10 reps for core stabilization ? ?Pt performed squats at edge of pool 10 reps ? ?Pt performed Ai Chi posture - "Balancing" with PT holding pt's UE for both assist with balance and for cueing for correct sequence with the exercise as UE moved in opposite direction of LE; pt held pool edge with other UE - 10 reps  each side  ? ?Pt performed water walking (forward) 18' x 2 reps with no UE support at end of session - 18' x 2 reps with cues to amb. At faster speed and to increase arm swing ? ?Pt requires buoyancy of water for support and safety with standing balance exercises; exercises are able to be safely attempted/performed in water without or with minimal UE support that cannot be performed safely on land.  Viscosity of water is needed for resistance for strengthening.  Current of water provides perturbations for challenges with standing balance.  Buoyancy of water also provides joint offloading which increases comfort and reduces pain in knees with weight bearing activities and exs in water, whereas, this is unable to be achieved with weight bearing exercises on land. ? ? ? ? ? ? ? ? ? ? ? ? ? ? ? ? ? ? ? ? ? ? ? ? PT Short Term Goals - 07/04/21 1922   ? ?  ? PT [place] #1  ? Title Pt will be independent with HEP for improved strength and flexibility, balance, transfers, and gait.   TARGET 05/26/2021   ? Time 4   ? Period Weeks   ? Status Achieved   ?  ? PT SHORT TERM GOAL #2  ? Title Pt will improve Berg score to at least 41/56 to decrease fall risk.   ? Baseline 36/56   ? Time 4   ? Period Weeks   ? Status Achieved   ?  ? PT SHORT TERM GOAL #3  ? Title Pt will improve TUG score to less than or equal to 21 sec for decreased fall risk.   ? Baseline 26.88 sec   ? Time 4   ? Period Weeks   ? Status Achieved   ?  ? PT SHORT TERM GOAL #4  ? Title Pt will verbalize understanding of fall prevention in home environment.   ? Time 4   ?  Period Weeks   ? Status Achieved   ? ?  ?  ? ?  ? ? ? ? PT Long Term Goals - 07/04/21 1922   ? ?  ? PT LONG TERM GOAL #1  ? Title Pt will be independent with HEP for improved flexibility and strength, balance,

## 2021-07-05 ENCOUNTER — Other Ambulatory Visit (HOSPITAL_BASED_OUTPATIENT_CLINIC_OR_DEPARTMENT_OTHER): Payer: Self-pay

## 2021-07-05 ENCOUNTER — Telehealth: Payer: Self-pay | Admitting: Interventional Cardiology

## 2021-07-05 DIAGNOSIS — H349 Unspecified retinal vascular occlusion: Secondary | ICD-10-CM

## 2021-07-05 NOTE — Telephone Encounter (Signed)
? ?  Pt is f/u the fax order from her eye doctor Dr. Warden Fillers, she said their office sent it twice and not yet heard from Dr. Tamala Julian. She said its an order for test that she needs to get ?

## 2021-07-05 NOTE — Telephone Encounter (Signed)
Called pt and advised I still have not received anything from Dr. Zenia Resides office.  She said they have faxed it over twice.  She said he is wanting her to have a test done that we would have to order because she's having double vision and possibly a piece of plaque has broken off.  Advised this sounds like a carotid doppler and I will call them to find out.  Called over to Dr. Zenia Resides office [phone]) and inquired about what they needed.  The lady that answered the phone said she does see where they tried to fax something over but had the wrong number.  She is going to send a message to the person who was trying to fax them and give them the fax numbers I provided [phone] and [phone]).   ?

## 2021-07-06 ENCOUNTER — Encounter: Payer: BLUE CROSS/BLUE SHIELD | Admitting: Occupational Therapy

## 2021-07-07 NOTE — Telephone Encounter (Signed)
Patient called to follow up on pervious call. She wants to know if you have received the order.  ?

## 2021-07-07 NOTE — Telephone Encounter (Signed)
Spoke with Breanna Webster and made her aware that I still have not received anything from Dr. Zenia Resides office.  Breanna Webster states she is going to go to their office and get a copy of the paperwork and bring it by.  ?

## 2021-07-09 ENCOUNTER — Telehealth: Payer: Self-pay | Admitting: Neurology

## 2021-07-09 DIAGNOSIS — Z952 Presence of prosthetic heart valve: Secondary | ICD-10-CM

## 2021-07-09 DIAGNOSIS — Z8673 Personal history of transient ischemic attack (TIA), and cerebral infarction without residual deficits: Secondary | ICD-10-CM

## 2021-07-09 DIAGNOSIS — G20 Parkinson's disease: Secondary | ICD-10-CM

## 2021-07-09 DIAGNOSIS — I69398 Other sequelae of cerebral infarction: Secondary | ICD-10-CM

## 2021-07-09 DIAGNOSIS — I480 Paroxysmal atrial fibrillation: Secondary | ICD-10-CM

## 2021-07-09 DIAGNOSIS — Z789 Other specified health status: Secondary | ICD-10-CM

## 2021-07-09 NOTE — Telephone Encounter (Signed)
Parkinsonian symptoms did not respond to TID Carbi-Levo-Dopa. ?I will order a DAT scan.  ?

## 2021-07-10 ENCOUNTER — Other Ambulatory Visit: Payer: Self-pay

## 2021-07-10 ENCOUNTER — Ambulatory Visit: Payer: Medicare Other | Admitting: Physical Therapy

## 2021-07-10 ENCOUNTER — Encounter: Payer: Self-pay | Admitting: Physical Therapy

## 2021-07-10 DIAGNOSIS — M6281 Muscle weakness (generalized): Secondary | ICD-10-CM

## 2021-07-10 DIAGNOSIS — R278 Other lack of coordination: Secondary | ICD-10-CM | POA: Diagnosis not present

## 2021-07-10 DIAGNOSIS — Z20822 Contact with and (suspected) exposure to covid-19: Secondary | ICD-10-CM | POA: Diagnosis not present

## 2021-07-10 DIAGNOSIS — R2689 Other abnormalities of gait and mobility: Secondary | ICD-10-CM

## 2021-07-10 DIAGNOSIS — R29898 Other symptoms and signs involving the musculoskeletal system: Secondary | ICD-10-CM | POA: Diagnosis not present

## 2021-07-10 DIAGNOSIS — R2681 Unsteadiness on feet: Secondary | ICD-10-CM | POA: Diagnosis not present

## 2021-07-10 NOTE — Therapy (Signed)
[facility] ?[place], [place], [address] ?Phone: [phone]   Fax:  [phone] ? ?Physical Therapy Treatment ? ?Patient Details  ?Name: Breanna Webster ?MRN: [id number] ?Date of Birth: Nov 15, 1945 ?Referring Provider (PT): Dohmeier, Asencion Partridge ? ? ?Encounter Date: 07/10/2021 ? ? PT End of Session - 07/10/21 2034   ? ? Visit Number 18   ? Number of Visits 23   ? Date for PT Re-Evaluation 07/18/21   ? Authorization Type Medicare/Generic Commercial   ? Progress Note Due on Visit 20   ? PT Start Time 1535   ? PT Stop Time 1620   ? PT Time Calculation (min) 45 min   ? Equipment Utilized During Treatment Other (comment)   bar bells, pool noodle, aquatic cuffs  ? Activity Tolerance Patient tolerated treatment well   ? Behavior During Therapy [facility] for tasks assessed/performed   ? ?  ?  ? ?  ? ? ?Past Medical History:  ?Diagnosis Date  ? Anxiety   ? Arthritis   ? "back" (04/22/2018)  ? Back pain   ? Blood transfusion without reported diagnosis   ? CAD (coronary artery disease)   ? a. 03/2018 s/p PCI/DES to the RCA (3.0x15 Onyx DES).  ? Carotid artery stenosis   ? Mild  ? Chest pain   ? Chronic lower back pain   ? Cirrhosis ([place])   ? Colon polyps   ? Diverticulitis   ? Diverticulosis   ? Esophageal thickening   ? seen on pre TAVR CT scan, also questionable cirrhosis. MRI recommended. Will refer to GI  ? Fatty liver   ? GERD (gastroesophageal reflux disease)   ? Grave's disease   ? History of colonic polyps 05/22/2017  ? History of hiatal hernia   ? Hypertension   ? Hypothyroidism   ? IBS (irritable bowel syndrome)   ? Osteopenia   ? Pulmonary nodules   ? seen on pre TAVR CT. likley benign. no follow up recommended if pt low risk.  ? S/P TAVR (transcatheter aortic valve replacement)   ? Severe aortic stenosis   ? Shortness of breath on exertion   ? Stroke [facility])   ? Thalassemia minor   ? Thyroid disease   ? Type II diabetes mellitus ([place])   ? ? ?Past Surgical  History:  ?Procedure Laterality Date  ? 72 HOUR [place] STUDY N/A 03/03/2018  ? Procedure: [place];  Surgeon: Mauri Pole, MD;  Location: [facility] END[facility];  Service: Endoscopy;  Laterality: N/A;  ? AORTIC VALVE REPLACEMENT  06/2018  ? COLONOSCOPY    ? COLONOSCOPY W/ BIOPSIES AND POLYPECTOMY    ? CORONARY ANGIOGRAPHY Right 04/21/2018  ? Procedure: CORONARY ANGIOGRAPHY (CATH LAB);  Surgeon: [name], MD;  Location: [place] CV LAB;  Service: Cardiovascular;  Laterality: Right;  ? CORONARY STENT INTERVENTION N/A 04/22/2018  ? Procedure: CORONARY STENT INTERVENTION;  Surgeon: [name], MD;  Location: [place] CV LAB;  Service: Cardiovascular;  Laterality: N/A;  ? DILATION AND CURETTAGE OF UTERUS    ? ESOPHAGEAL MANOMETRY N/A 03/03/2018  ? Procedure: ESOPHAGEAL MANOMETRY (EM);  Surgeon: Mauri Pole, MD;  Location: [facility];  Service: Endoscopy;  Laterality: N/A;  ? GASTRIC FUNDOPLICATION    ? HERNIA REPAIR    ? HYSTEROSCOPY    ? fibroids  ? LAPAROSCOPIC CHOLECYSTECTOMY    ? LAPAROSCOPY    ? fibroids  ? NISSEN FUNDOPLICATION  3151V  ? POLYPECTOMY    ?  RIGHT/LEFT HEART CATH AND CORONARY ANGIOGRAPHY N/A 02/20/2018  ? Procedure: RIGHT/LEFT HEART CATH AND CORONARY ANGIOGRAPHY;  Surgeon: [name], MD;  Location: [place] CV LAB;  Service: Cardiovascular;  Laterality: N/A;  ? TEE WITHOUT CARDIOVERSION N/A 07/08/2018  ? Procedure: TRANSESOPHAGEAL ECHOCARDIOGRAM (TEE);  Surgeon: Burnell Blanks, MD;  Location: [place];  Service: Open Heart Surgery;  Laterality: N/A;  ? TEE WITHOUT CARDIOVERSION  10/07/2018  ? TEE WITHOUT CARDIOVERSION N/A 10/07/2018  ? Procedure: TRANSESOPHAGEAL ECHOCARDIOGRAM (TEE);  Surgeon: Jerline Pain, MD;  Location: [facility] ENDOSCOPY;  Service: Cardiovascular;  Laterality: N/A;  ? TONSILLECTOMY    ? TRANSCATHETER AORTIC VALVE REPLACEMENT, TRANSFEMORAL N/A 07/08/2018  ? Procedure: TRANSCATHETER AORTIC VALVE REPLACEMENT, TRANSFEMORAL;  Surgeon: Burnell Blanks, MD;  Location: [place];  Service: Open Heart Surgery;  Laterality: N/A;  ? ? ?There were no vitals filed for this visit. ? ? Subjective Assessment - 07/10/21 2032   ? ? Subjective Pt reports she is having alot of Rt knee pain today - has appt with Dr. Wynelle Link in May   ? Pertinent History significant arthritis in knees   ? Patient Stated Goals Pt's goals for therapy are to get better with walking and with arthritis.   ? Currently in Pain? Yes   ? Pain Score 7    ? Pain Location Knee   ? Pain Orientation Right   ? Pain Descriptors / Indicators Aching;Discomfort   ? Pain Type Chronic pain   ? Pain Onset More than a month ago   ? Pain Frequency Intermittent   ? ?  ?  ? ?  ? ? ? ? ? ? ?Aquatic therapy at [facility] - pool temp 90 degrees ? ?Patient seen for aquatic therapy today.  Treatment took place in water 3.5-4.5 feet deep depending upon activity.  Pt entered & exited the pool  ?the pool via step negotiation using step by step sequence for descension and ascension with use of hand rails with supervision ? ?Pt performed water walking 18' x 6 reps forwards across pool, using bar bells initially for approx. 4 reps and then without barbells to work on balance with gait; 18' x 2 reps with UE support with use of bar bells;  18' x 4 reps backwards across width of pool with UE support on bar bells ? ?Pt performed marching in place 10 reps with bil. UE support on pool edge prn;  marching forwards 18' x 2 reps with use of bar bells  ? ?Pt performed standing balance exercises with UE support on edge of pool - marching in place 10 reps each leg; ? ?Pt performed alternate tap ups to aquatic step 10 reps each leg with minimal UE support on pool edge; performed 10 reps each leg without UE support with close SBA from PT ? ?Pt performed Ai Chi postures "Enclosing" and "Gathering" 10 reps each ? ?Pt performed squats at edge of pool 10 reps ? ?Pt performed water walking (forward) 18' x 2 reps with no UE  support at end of session - 18' x 2 reps with cues to amb. At faster speed and to increase arm swing ? ?Pt requires buoyancy of water for support and safety with standing balance exercises; exercises are able to be safely attempted/performed in water without or with minimal UE support that cannot be performed safely on land.  Viscosity of water is needed for resistance for strengthening.  Current of water provides perturbations for challenges with standing  balance.  Buoyancy of water also provides joint offloading which increases comfort and reduces pain in knees with weight bearing activities and exs in water, whereas, this is unable to be achieved with weight bearing exercises on land. ? ? ? ? ? ? ? ? ? ? ? ? ? ? ? ? ? ? ? ? ? ? ? ? ? ? ? ? ? PT Short Term Goals - 07/10/21 2041   ? ?  ? PT SHORT TERM GOAL #1  ? Title Pt will be independent with HEP for improved strength and flexibility, balance, transfers, and gait.   TARGET 05/26/2021   ? Time 4   ? Period Weeks   ? Status Achieved   ?  ? PT SHORT TERM GOAL #2  ? Title Pt will improve Berg score to at least 41/56 to decrease fall risk.   ? Baseline 36/56   ? Time 4   ? Period Weeks   ? Status Achieved   ?  ? PT SHORT TERM GOAL #3  ? Title Pt will improve TUG score to less than or equal to 21 sec for decreased fall risk.   ? Baseline 26.88 sec   ? Time 4   ? Period Weeks   ? Status Achieved   ?  ? PT SHORT TERM GOAL #4  ? Title Pt will verbalize understanding of fall prevention in home environment.   ? Time 4   ? Period Weeks   ? Status Achieved   ? ?  ?  ? ?  ? ? ? ? PT Long Term Goals - 07/10/21 2042   ? ?  ? PT LONG TERM GOAL #1  ? Title Pt will be independent with HEP for improved flexibility and strength, balance, transfers, and gait.  TARGET 06/23/2021   ? Time 5   ? Period Weeks   ? Status Achieved   ? Target Date 07/18/21   ?  ? PT LONG TERM GOAL #2  ? Title Pt will improve Berg score to at least 46/56 to decrease fall risk.   ? Time 5   ? Period Weeks   ?  Status Achieved   47/56  ? Target Date 07/18/21   ?  ? PT LONG TERM GOAL #3  ? Title Pt will improve TUG score to less than or equal to 16 sec for decreased fall risk.   ? Time 5   ? Period Weeks   ? Status On-g

## 2021-07-11 ENCOUNTER — Ambulatory Visit: Payer: Medicare Other | Admitting: Physical Therapy

## 2021-07-11 ENCOUNTER — Encounter: Payer: BLUE CROSS/BLUE SHIELD | Admitting: Occupational Therapy

## 2021-07-12 ENCOUNTER — Telehealth: Payer: Self-pay | Admitting: Interventional Cardiology

## 2021-07-12 NOTE — Telephone Encounter (Signed)
Pt called in and spoke with scheduling and inquired about paperwork.  Advised I have not received paperwork as of yet.  Scheduler let me know that pt's husband got on the phone and said if they haven't heard anything by Friday they were coming to the office.  Called pt and left a detailed message letting her know that I have checked both fax machines that were provided to Dr. Zenia Resides office and I have checked all other places the paperwork might be, and found nothing.  Advised she contact Dr. Zenia Resides office and have them fax it again or call me and let me know that she dropped it off the other day like we discussed.  ?

## 2021-07-12 NOTE — Telephone Encounter (Signed)
New Message: ? ? ? ? ? ?Patient is calling to see if her clearance have been received from Dr Zenia Resides office. Please let me know and I will call her please. ?

## 2021-07-13 ENCOUNTER — Encounter: Payer: BLUE CROSS/BLUE SHIELD | Admitting: Occupational Therapy

## 2021-07-13 NOTE — Telephone Encounter (Signed)
I s/w Dr. Zenia Resides office for clarification. It was stated to me that they are not performing any procedure on the pt. It was stated that 2 letters were sent over for Dr. Tamala Julian with recommendations from Dr. Katy Fitch. See previous phone notes 3/15, 3/17. I have asked for Dr. Patrici Ranks office to please fax the notes attn: Arbie Cookey. I will be sure to have the note scanned into the chart. I confirmed multiple times while on the phone with their office the pt is not having any procedures with their office.  ? ?I stated that I feel that is a lot of confusion and that the pt may not truly understand what Dr. Katy Fitch was asking of Dr. Tamala Julian. In speaking with their office today it was stated to me that Dr. Katy Fitch was possibly recommending to Dr. Tamala Julian the pt may need a TEE. Will await for the note to be faxed over today. I assured I will get the note to Dr. Tamala Julian and his nurse once I receive the note.  ? ?I will update Dr. Thompson Caul nurse as well as the pre op provider of conversation today.  ?

## 2021-07-13 NOTE — Telephone Encounter (Signed)
Preoperative team, please contact requesting office/patient and have them submit a formal preoperative cardiac request form.  Once we receive request, we will be able to provide recommendations.  Thank you for your help. ? ?Jossie Ng. Fleur Audino NP-C ? ?  ?07/13/2021, 10:49 AM ?[place] ?[address] ?Office [phone] Fax [phone] ? ?

## 2021-07-13 NOTE — Telephone Encounter (Signed)
Dr. Tamala Julian reviewed note from Dr. Katy Fitch and is agreeable to pt having a carotid doppler performed.  Called pt and made her aware.  Advised I will place the order and the scheduler will contact her to schedule.  Pt appreciative for call.  ?

## 2021-07-13 NOTE — Telephone Encounter (Signed)
Ov note has been received from Dr. Zenia Resides office. I have handed off the notes to Dr. Thompson Caul nurse Marveen Reeks RN. I will have notes also scanned in the chart as well by our HIM dept.  ?

## 2021-07-17 ENCOUNTER — Other Ambulatory Visit (HOSPITAL_BASED_OUTPATIENT_CLINIC_OR_DEPARTMENT_OTHER): Payer: Self-pay

## 2021-07-17 ENCOUNTER — Ambulatory Visit: Payer: Medicare Other | Admitting: Physical Therapy

## 2021-07-18 ENCOUNTER — Encounter: Payer: Self-pay | Admitting: Occupational Therapy

## 2021-07-18 NOTE — Therapy (Signed)
[facility] [address], [address] [place], [place], [address] Phone: [phone]   Fax:  [phone]  Patient Details  Name: Breanna Webster MRN: [id number] Date of Birth: 10/23/1945 Referring Provider:  No ref. provider found  Encounter Date: 07/18/2021 OCCUPATIONAL THERAPY DISCHARGE SUMMARY  Visits from Start of Care: 6  Current functional level related to goals / functional outcomes: Pt continues to demonstrate resting tremor in L hand impacting functional grasp and incorporation of LUE into self-care and functional tasks.  Pt has demonstrated increased grip strength and coordination of LUE, despite tremors.  Pt currently reports frustration with perceived lack of progress and ongoing testing to confirm a diagnosis for her tremors and balance impairments.  Pt continues to state that she wants to hold off on therapy until she has a more firm diagnosis and is no longer undergoing multiple tests and MD visits.     Remaining deficits: LUE weakness, decreased coordination in LUE, tremor in LUE, balance deficits   Education / Equipment: Pt has been educated on techniques for both essential tremor and PD to address her tremors in LUE.  Pt has received HEP for strengthening and stability with dumbbells and theraband as well as theraputty and FMC HEP for hand strength and coordination.   Patient agrees to discharge. Patient goals were not met. Patient is being discharged due to the patient's request..     HOXIE, SARAH, OT 07/18/2021, 11:42 AM  [facility] [address], [address] [place], [place], [address] Phone: [phone]   Fax:  [phone] 

## 2021-07-19 ENCOUNTER — Ambulatory Visit (HOSPITAL_COMMUNITY)
Admission: RE | Admit: 2021-07-19 | Discharge: 2021-07-19 | Disposition: A | Discharge status: Home / Self Care | Payer: Medicare Other | Source: Ambulatory Visit | Attending: Cardiology | Admitting: Cardiology

## 2021-07-19 DIAGNOSIS — Z20822 Contact with and (suspected) exposure to covid-19: Secondary | ICD-10-CM | POA: Diagnosis not present

## 2021-07-19 DIAGNOSIS — H349 Unspecified retinal vascular occlusion: Secondary | ICD-10-CM | POA: Diagnosis not present

## 2021-07-20 ENCOUNTER — Ambulatory Visit (INDEPENDENT_AMBULATORY_CARE_PROVIDER_SITE_OTHER): Payer: Medicare Other

## 2021-07-20 DIAGNOSIS — I480 Paroxysmal atrial fibrillation: Secondary | ICD-10-CM

## 2021-07-20 DIAGNOSIS — R350 Frequency of micturition: Secondary | ICD-10-CM

## 2021-07-20 DIAGNOSIS — I2510 Atherosclerotic heart disease of native coronary artery without angina pectoris: Secondary | ICD-10-CM

## 2021-07-20 DIAGNOSIS — E785 Hyperlipidemia, unspecified: Secondary | ICD-10-CM

## 2021-07-20 DIAGNOSIS — Z8673 Personal history of transient ischemic attack (TIA), and cerebral infarction without residual deficits: Secondary | ICD-10-CM

## 2021-07-20 DIAGNOSIS — I10 Essential (primary) hypertension: Secondary | ICD-10-CM

## 2021-07-20 DIAGNOSIS — E1169 Type 2 diabetes mellitus with other specified complication: Secondary | ICD-10-CM

## 2021-07-20 NOTE — Patient Instructions (Signed)
Visit Information ? ?Thank you for taking time to visit with me today. Please don't hesitate to contact me if I can be of assistance to you before our next scheduled telephone appointment. ? ?Following are the goals we discussed today:  ?Take all medications as prescribed ?Attend all scheduled provider appointments ?Call pharmacy for medication refills 3-7 days in advance of running out of medications ?Call provider office for new concerns or questions  ?keep appointment with eye doctor ?check blood sugar at prescribed times: twice daily and when you have symptoms of low or high blood sugar ?fill half of plate with vegetables ?manage portion size ?keep feet up while sitting ?wash and dry feet carefully every day ?wear comfortable, cotton socks ?wear comfortable, well-fitting shoes ?check pulse (heart) rate once a day ?make a plan to eat healthy ?take medicine as prescribed ?check blood pressure daily ?choose a place to take my blood pressure (home, clinic or office, retail store) ?call doctor for signs and symptoms of high blood pressure ?keep all doctor appointments ?eat more whole grains, fruits and vegetables, lean meats and healthy fats ?call for medicine refill 2 or 3 days before it runs out ?take all medications exactly as prescribed ?call doctor with any symptoms you believe are related to your medicine ? ?Our next appointment is by telephone on 09/12/21 at 10:45 AM ? ?Please call the care guide team at [phone] if you need to cancel or reschedule your appointment.  ? ?If you are experiencing a Mental Health or [place] or need someone to talk to, please call the Suicide and Crisis Lifeline: 988 ?call the [place] National Suicide Prevention Lifeline: (762)354-0759 or TTY: 669-360-7112 TTY (458)324-8069) to talk to a trained counselor ?call 1-800-273-TALK (toll free, 24 hour hotline) ?go to [facility] [address], [place] [phone]) ?call 911   ? ?Patient verbalizes understanding of instructions and care plan provided today and agrees to view in [facility]. Active MyChart status confirmed with patient.   ?Peter Garter RN, BSN,CCM, CDE ?Care Management Coordinator ?[facility] ?[phone]   ?

## 2021-07-20 NOTE — Chronic Care Management (AMB) (Signed)
?Chronic Care Management  ? ?CCM RN Visit Note ? ?07/20/2021 ?Name: Breanna Webster MRN: [id number] DOB: 28-Oct-1945 ? ?Subjective: ?Breanna Webster is a 76 y.o. year old female who is a primary care patient of Nafziger, Tommi Rumps, NP. The care management team was consulted for assistance with disease management and care coordination needs.   ? ?Engaged with patient by telephone for follow up visit in response to provider referral for case management and/or care coordination services.  ? ?Consent to Services:  ?The patient was given information about Chronic Care Management services, agreed to services, and gave verbal consent prior to initiation of services.  Please see initial visit note for detailed documentation.  ? ?Patient agreed to services and verbal consent obtained.  ? ?Assessment: Review of patient past medical history, allergies, medications, health status, including review of consultants reports, laboratory and other test data, was performed as part of comprehensive evaluation and provision of chronic care management services.  ? ?SDOH (Social Determinants of Health) assessments and interventions performed:   ? ?CCM Care Plan ? ?Allergies  ?Allergen Reactions  ? Statins Other (See Comments)  ?  Muscle aches  ? ? ?Outpatient Encounter Medications as of 07/20/2021  ?Medication Sig  ? apixaban (ELIQUIS) 5 MG TABS tablet TAKE ONE TABLET BY MOUTH TWICE A DAY  ? b complex vitamins capsule Take 1 capsule by mouth daily.  ? BD PEN NEEDLE NANO 2ND GEN 32G X 4 MM MISC   ? Blood Glucose Monitoring Suppl (ONE TOUCH ULTRA 2) w/Device KIT Use to monitor blood glucose  ? Blood Glucose Monitoring Suppl (ONETOUCH VERIO FLEX SYSTEM) w/Device KIT Use as directed 3 times daily  ? carbidopa-levodopa (SINEMET) 25-100 MG tablet Take 1 tablet by mouth 2 (two) times daily.  ? cholecalciferol (VITAMIN D) 25 MCG (1000 UNIT) tablet Take 1,000 Units by mouth daily.  ? diltiazem (CARDIZEM CD) 120 MG 24 hr capsule Take 1 capsule (120 mg  total) by mouth daily.  ? escitalopram (LEXAPRO) 20 MG tablet TAKE ONE TABLET BY MOUTH DAILY  ? estradiol (ESTRACE) 0.1 MG/GM vaginal cream Place vaginally.  ? estradiol (ESTRACE) 0.1 MG/GM vaginal cream Place 1 applicatorful into the vagina 2 nights per week  ? Evolocumab (REPATHA SURECLICK) 626 MG/ML SOAJ Inject 1 dose into the skin every 14 (fourteen) days.  ? famotidine (PEPCID) 20 MG tablet Take 1 tablet by mouth every night at bedtime  ? glucose blood (ONETOUCH VERIO) test strip USE TO CHECK BLOOD SUGAR THREE TIMES DAILY  ? glucose blood test strip Use to check blood sugar 3 times daily  ? Insulin Pen Needle (UNIFINE PENTIPS) 32G X 4 MM MISC Inject 4 times subcutaneously  ? Insulin Pen Needle 32G X 4 MM MISC Use to inject 4 times daily  ? Insulin Syringe-Needle U-100 (ULTICARE INSULIN SYRINGE) 31G X 5/16" 1 ML MISC FOUR TIMES A DAY  ? Insulin Syringe-Needle U-100 (ULTICARE INSULIN SYRINGE) 31G X 5/16" 1 ML MISC Inject 4 times daily subcutaneously  ? Insulin Syringe-Needle U-100 31G X 5/16" 1 ML MISC Use for injections 4 times daily  ? Lancets (ONETOUCH DELICA PLUS [id number]) MISC Use 1 lancet 2 times daily  ? Lancets (ONETOUCH DELICA PLUS [id number]) MISC Use 1 lancet 2 times daily  ? Lancets (ONETOUCH DELICA PLUS [id number]) MISC Use twice daily  ? levothyroxine (SYNTHROID) 125 MCG tablet Take 1 tablet by mouth every morning on an empty stomach  ? levothyroxine (SYNTHROID) 137 MCG tablet Take 137 mcg by mouth once.  ?  levothyroxine (SYNTHROID) 137 MCG tablet Take 1 tablet by mouth every morning on an empty stomach  ? losartan (COZAAR) 50 MG tablet TAKE ONE TABLET BY MOUTH DAILY  ? metFORMIN (GLUCOPHAGE-XR) 500 MG 24 hr tablet Take 500 mg by mouth 2 (two) times daily.  ? metFORMIN (GLUCOPHAGE-XR) 500 MG 24 hr tablet Take 1 tablet by mouth 2 times daily  ? mirabegron ER (MYRBETRIQ) 50 MG TB24 tablet Take 50 mg by mouth daily.  ? mirabegron ER (MYRBETRIQ) 50 MG TB24 tablet Take 1 tablet by mouth once daily.  ?  Multiple Vitamin (MULITIVITAMIN WITH MINERALS) TABS Take 1 tablet by mouth daily.  ? nitroGLYCERIN (NITROSTAT) 0.4 MG SL tablet Place 1 tablet (0.4 mg total) under the tongue every 5 (five) minutes as needed for chest pain.  ? NOVOLIN N 100 UNIT/ML injection Inject 28 Units into the skin daily before breakfast.  ? NOVOLIN R 100 UNIT/ML injection 8 Units 2 (two) times daily before a meal. 10 UNITS THIRTY MINUTES BEFORE MEALS.  5 ADDITIONAL UNITS WITH CARBS OR SNACKS. (04/26/2021:  Pt reports she only takes the 8 units 2x/daily before meals, not the 10 units)  ? omeprazole (PRILOSEC) 20 MG capsule Take 1 capsule (20 mg total) by mouth 2 (two) times daily before a meal.  ? OneTouch Delica Lancets 08Q MISC Tests BS 2 times  ? ?No facility-administered encounter medications on file as of 07/20/2021.  ? ? ?Patient Active Problem List  ? Diagnosis Date Noted  ? Left hemiparesis ([place]) 06/21/2021  ? Other specified hereditary hemolytic anemias ([place]) 06/21/2021  ? Parkinsonism ([place]) 06/21/2021  ? COVID-19 02/14/2021  ? Postviral fatigue syndrome 02/14/2021  ? Difficulty with adaptive servo-ventilation (ASV) use 02/14/2021  ? Sepsis secondary to UTI ([place]) 09/08/2020  ? Elevated ALT measurement 09/08/2020  ? History of CVA with residual deficit 09/08/2020  ? Hyperbilirubinemia 09/08/2020  ? Fatigue associated with anemia 08/03/2020  ? Cerebrovascular accident (CVA) due to embolism of right posterior cerebral artery ([place]) 08/03/2020  ? OSA treated with BiPAP 08/03/2020  ? Complex sleep apnea syndrome 08/03/2020  ? Treatment-emergent central sleep apnea 08/03/2020  ? Chronic intermittent hypoxia with obstructive sleep apnea 04/21/2020  ? OSA (obstructive sleep apnea) 04/21/2020  ? History of cardioembolic stroke 76/19/5093  ? Gait disturbance, post-stroke 03/29/2020  ? 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 ([place]) 03/29/2020  ? Anxiety   ? RLQ abdominal pain 10/22/2019  ? Paroxysmal atrial fibrillation ([place]) 05/22/2019   ? Iron deficiency anemia 05/07/2019  ? Atrial fibrillation with RVR ([place]) 10/21/2018  ? Cerebellar stroke, acute ([place]) 10/21/2018  ? Streptococcal endocarditis   ? Endocarditis of mitral valve 10/07/2018  ? Bacteremia due to Streptococcus Salivarius 10/07/2018  ? Sepsis ([place]) 10/04/2018  ? Acute metabolic encephalopathy 26/71/2458  ? Severe aortic stenosis 07/08/2018  ? S/P TAVR (transcatheter aortic valve replacement) 07/08/2018  ? Esophageal thickening   ? CAD in native artery 04/22/2018  ? Gastroesophageal reflux disease   ? Pulmonary hypertension ([place]) 02/21/2018  ? Essential hypertension 07/15/2017  ? History of colonic polyps 05/22/2017  ? Elevated liver function tests 12/05/2016  ? DOE (dyspnea on exertion) 07/19/2016  ? Thalassemia minor 05/29/2016  ? Left bundle branch block 12/06/2015  ? Upper airway cough syndrome 10/14/2015  ? Diastolic heart failure ([place]) 10/10/2015  ? Myalgia 02/17/2014  ? Carotid artery stenosis 06/05/2013  ? Eustachian tube dysfunction 05/07/2013  ? Neuropathy of leg 03/07/2012  ? Anemia, unspecified 01/31/2012  ? Hot flashes 08/24/2010  ?  Diabetes mellitus due to underlying condition, uncontrolled 06/30/2010  ? Generalized abdominal pain 03/24/2010  ? Obesity (BMI 30.0-34.9) 12/21/2009  ? IBS (irritable bowel syndrome) 04/29/2009  ? Vitamin D deficiency 03/10/2009  ? Acute bronchitis 03/07/2009  ? Hypothyroidism 12/13/2008  ? Dyslipidemia 12/13/2008  ? Anxiety state 12/13/2008  ? Other specified disorders of bladder 12/13/2008  ? ? ?Conditions to be addressed/monitored:Atrial Fibrillation, CAD, HTN, HLD, DMII, and hx CVA ? ?Care Plan : RN Care Manager Plan of Care  ?Updates made by [name], RN since 07/20/2021 12:00 AM  ?  ? ?Problem: Chronic Disease Management and Care Coordination Needs (DM2, CAD, Afib,HTN. Hx CVA )   ?Priority: High  ?  ? ?Long-Range Goal: Establish Plan of Care for Chronic Disease Management Needs (DM2, CAD, Afib,HTN. Hx CVA )   ?Start Date:  03/09/2021  ?Expected End Date: 07/20/2022  ?Recent Progress: On track  ?Priority: High  ?Note:   ?Current Barriers:  ?Care Coordination needs related to Financial constraints related to cost of medications ?Chronic D

## 2021-07-21 DIAGNOSIS — E1169 Type 2 diabetes mellitus with other specified complication: Secondary | ICD-10-CM | POA: Diagnosis not present

## 2021-07-21 DIAGNOSIS — I480 Paroxysmal atrial fibrillation: Secondary | ICD-10-CM | POA: Diagnosis not present

## 2021-07-21 DIAGNOSIS — I2510 Atherosclerotic heart disease of native coronary artery without angina pectoris: Secondary | ICD-10-CM

## 2021-07-21 DIAGNOSIS — I10 Essential (primary) hypertension: Secondary | ICD-10-CM

## 2021-07-21 DIAGNOSIS — E785 Hyperlipidemia, unspecified: Secondary | ICD-10-CM | POA: Diagnosis not present

## 2021-07-21 DIAGNOSIS — Z794 Long term (current) use of insulin: Secondary | ICD-10-CM

## 2021-07-24 ENCOUNTER — Ambulatory Visit: Payer: Medicare Other | Admitting: Physical Therapy

## 2021-07-24 ENCOUNTER — Telehealth: Payer: Self-pay | Admitting: Neurology

## 2021-07-24 DIAGNOSIS — I480 Paroxysmal atrial fibrillation: Secondary | ICD-10-CM

## 2021-07-24 DIAGNOSIS — G20 Parkinson's disease: Secondary | ICD-10-CM

## 2021-07-24 DIAGNOSIS — Z8673 Personal history of transient ischemic attack (TIA), and cerebral infarction without residual deficits: Secondary | ICD-10-CM

## 2021-07-24 DIAGNOSIS — Z952 Presence of prosthetic heart valve: Secondary | ICD-10-CM

## 2021-07-24 DIAGNOSIS — I69398 Other sequelae of cerebral infarction: Secondary | ICD-10-CM

## 2021-07-24 NOTE — Telephone Encounter (Signed)
Called and LVM. Relayed message again that I sent on 07/12/21 via mychart: ? ?" Dr. Brett Fairy spoke with Dr. Rexene Alberts personally. Dr. Brett Fairy is moving forward with what Dr. Rexene Alberts is recommending, which is the DAT scan.  Dr. Brett Fairy placed this imaging order already. Dr. Brett Fairy wants her to complete this first Once you have this completed, we will be in touch with you about the results"  ? ?Advised I spoke w/ Raquel Sarna today and she is working on Ship broker. Once obtained, someone will call her to schedule this. ?

## 2021-07-24 NOTE — Telephone Encounter (Signed)
Pt would like a call from the nurse to discuss a appt with Dr. Rexene Alberts. ?

## 2021-07-25 ENCOUNTER — Encounter: Payer: Self-pay | Admitting: Hematology

## 2021-07-25 ENCOUNTER — Encounter (HOSPITAL_COMMUNITY): Payer: Medicare Other

## 2021-07-25 NOTE — Telephone Encounter (Signed)
Noted, thank you... sent Shanon Brow a message to make sure he can see it.  ?

## 2021-07-25 NOTE — Telephone Encounter (Signed)
Medicare/GHI no auth req bc Medicare is primary order sent to [facility] [employer]. They will reach out to the patient to schedule. ?

## 2021-07-25 NOTE — Addendum Note (Signed)
Addended by: Wyvonnia Lora on: 07/25/2021 12:31 PM ? ? Modules accepted: Orders ? ?

## 2021-07-25 NOTE — Telephone Encounter (Signed)
Breanna Webster with [facility] messaged and said he does not see an order for a DaTscan.. im not sure if it isn't put it right.  ?

## 2021-07-27 ENCOUNTER — Encounter: Payer: Self-pay | Admitting: Pharmacist

## 2021-07-27 DIAGNOSIS — Z20822 Contact with and (suspected) exposure to covid-19: Secondary | ICD-10-CM | POA: Diagnosis not present

## 2021-07-28 ENCOUNTER — Other Ambulatory Visit (HOSPITAL_BASED_OUTPATIENT_CLINIC_OR_DEPARTMENT_OTHER): Payer: Self-pay

## 2021-07-28 ENCOUNTER — Telehealth: Payer: Self-pay | Admitting: Pharmacist

## 2021-07-28 NOTE — Chronic Care Management (AMB) (Signed)
? ? ?Chronic Care Management ?Pharmacy Assistant  ? ?Name: Breanna Webster  MRN: [id number] DOB: Nov 12, 1945 ? ?Reason for Encounter: Disease State Diabetes Assessment ?  ?Conditions to be addressed/monitored: ?DMII ? ?Recent office visits:  ?06/29/21 [name], NP - Patient presented for Lower extremity edema and other concerns. No medication changes. ? ?Recent consult visits:  ?07/20/21 [name], RN - Patient presented for Nurse Chronic Care Management visit. ? ?07/19/21 Patient presented to [facility] for Carotid. ? ?07/10/21 Dilday Vinnie Level (PT) - Patient presented to Outpatient rehab for unsteadiness on feet. ? ?07/03/21 Dilday Vinnie Level (PT) - Patient presented to Outpatient rehab for unsteadiness on feet. ? ?06/27/21 Tanda Rockers, MD (Pulmonology) - Patient presented for Upper airway cough syndrome. Prescribed Acetaminophen Codeine. Stopped Amoxicillin ? ?06/27/21 Kerrie Buffalo, OT - Patient presented for Muscle weakness and other concerns. ? ?06/27/21 [name], PT - Patient presented for Other abnormalities of gait and mobility and other concerns. ? ?06/26/21 Dilday Vinnie Level (PT) - Patient presented to Outpatient rehab for unsteadiness on feet and other concerns. ? ?06/21/21 Dohmeier, Asencion Partridge, MD (Neurology) - Patient presented for Parkinsonism unspecified and other concerns. Prescribed Carbidopa - Levodopa. ? ?06/20/21 Kerrie Buffalo, OT - Patient presented for Muscle weakness and other concerns. ? ?06/20/21 [name], PT - Patient presented for Other abnormalities of gait and mobility and other concerns. ? ?06/16/21 [name], RN - Patient presented for Nurse Chronic Care Management visit. ? ?06/16/21 Kerrie Buffalo, OT - Patient presented for Muscle weakness and other concerns. ? ?06/14/21 Kerrie Buffalo, OT - Patient presented for Muscle weakness and other concerns. ? ?06/13/21  [name], PT - Patient presented for Other abnormalities of gait and  mobility and other concerns. ? ?06/12/21 Dilday Vinnie Level (PT) - Patient presented for Muscle weakness generalized and other concerns. ? ? ?Hospital visits:  ?Medication Reconciliation was completed by comparing discharge summary, patient?s EMR and Pharmacy list, and upon discussion with patient. ?  ?Patient presented to [facility] on  04/05/21 due to Fall. Patient was present for 4 hours. ?  ?New?Medications Started at [facility] Discharge:?? ?-started  ?None ?  ?Medication Changes at Hospital Discharge: ?-Changed  ?None ?  ?Medications Discontinued at Hospital Discharge: ?-Stopped ?None ?  ?Medications that remain the same after Hospital Discharge:??  ?-All other medications will remain the same.   ?  ? ?Medications: ?Outpatient Encounter Medications as of 07/28/2021  ?Medication Sig  ? apixaban (ELIQUIS) 5 MG TABS tablet TAKE ONE TABLET BY MOUTH TWICE A DAY  ? b complex vitamins capsule Take 1 capsule by mouth daily.  ? BD PEN NEEDLE NANO 2ND GEN 32G X 4 MM MISC   ? Blood Glucose Monitoring Suppl (ONE TOUCH ULTRA 2) w/Device KIT Use to monitor blood glucose  ? Blood Glucose Monitoring Suppl (ONETOUCH VERIO FLEX SYSTEM) w/Device KIT Use as directed 3 times daily  ? carbidopa-levodopa (SINEMET) 25-100 MG tablet Take 1 tablet by mouth 2 (two) times daily.  ? cholecalciferol (VITAMIN D) 25 MCG (1000 UNIT) tablet Take 1,000 Units by mouth daily.  ? diltiazem (CARDIZEM CD) 120 MG 24 hr capsule Take 1 capsule (120 mg total) by mouth daily.  ? escitalopram (LEXAPRO) 20 MG tablet TAKE ONE TABLET BY MOUTH DAILY  ? estradiol (ESTRACE) 0.1 MG/GM vaginal cream Place vaginally.  ? estradiol (ESTRACE) 0.1 MG/GM vaginal cream Place 1 applicatorful into the vagina 2 nights per week  ? Evolocumab (  REPATHA SURECLICK) 037 MG/ML SOAJ Inject 1 dose into the skin every 14 (fourteen) days.  ? famotidine (PEPCID) 20 MG tablet Take 1 tablet by mouth every night at bedtime  ? glucose blood (ONETOUCH VERIO) test strip USE TO  CHECK BLOOD SUGAR THREE TIMES DAILY  ? glucose blood test strip Use to check blood sugar 3 times daily  ? Insulin Pen Needle (UNIFINE PENTIPS) 32G X 4 MM MISC Inject 4 times subcutaneously  ? Insulin Pen Needle 32G X 4 MM MISC Use to inject 4 times daily  ? Insulin Syringe-Needle U-100 (ULTICARE INSULIN SYRINGE) 31G X 5/16" 1 ML MISC FOUR TIMES A DAY  ? Insulin Syringe-Needle U-100 (ULTICARE INSULIN SYRINGE) 31G X 5/16" 1 ML MISC Inject 4 times daily subcutaneously  ? Insulin Syringe-Needle U-100 31G X 5/16" 1 ML MISC Use for injections 4 times daily  ? Lancets (ONETOUCH DELICA PLUS [id number]) MISC Use 1 lancet 2 times daily  ? Lancets (ONETOUCH DELICA PLUS [id number]) MISC Use 1 lancet 2 times daily  ? Lancets (ONETOUCH DELICA PLUS [id number]) MISC Use twice daily  ? levothyroxine (SYNTHROID) 125 MCG tablet Take 1 tablet by mouth every morning on an empty stomach  ? levothyroxine (SYNTHROID) 137 MCG tablet Take 137 mcg by mouth once.  ? levothyroxine (SYNTHROID) 137 MCG tablet Take 1 tablet by mouth every morning on an empty stomach  ? losartan (COZAAR) 50 MG tablet TAKE ONE TABLET BY MOUTH DAILY  ? metFORMIN (GLUCOPHAGE-XR) 500 MG 24 hr tablet Take 500 mg by mouth 2 (two) times daily.  ? metFORMIN (GLUCOPHAGE-XR) 500 MG 24 hr tablet Take 1 tablet by mouth 2 times daily  ? mirabegron ER (MYRBETRIQ) 50 MG TB24 tablet Take 50 mg by mouth daily.  ? mirabegron ER (MYRBETRIQ) 50 MG TB24 tablet Take 1 tablet by mouth once daily.  ? Multiple Vitamin (MULITIVITAMIN WITH MINERALS) TABS Take 1 tablet by mouth daily.  ? nitroGLYCERIN (NITROSTAT) 0.4 MG SL tablet Place 1 tablet (0.4 mg total) under the tongue every 5 (five) minutes as needed for chest pain.  ? NOVOLIN N 100 UNIT/ML injection Inject 28 Units into the skin daily before breakfast.  ? NOVOLIN R 100 UNIT/ML injection 8 Units 2 (two) times daily before a meal. 10 UNITS THIRTY MINUTES BEFORE MEALS.  5 ADDITIONAL UNITS WITH CARBS OR SNACKS. (04/26/2021:  Pt reports she  only takes the 8 units 2x/daily before meals, not the 10 units)  ? omeprazole (PRILOSEC) 20 MG capsule Take 1 capsule (20 mg total) by mouth 2 (two) times daily before a meal.  ? OneTouch Delica Lancets 50V MISC Tests BS 2 times  ? ?No facility-administered encounter medications on file as of 07/28/2021.  ?Recent Relevant Labs: ?Lab Results  ?Component Value Date/Time  ? HGBA1C 7.4 (H) 09/08/2020 04:16 AM  ? HGBA1C 6.5 05/05/2020 12:00 AM  ? HGBA1C 7.5 (H) 10/22/2018 04:45 AM  ? MICROALBUR 0.8 10/07/2015 01:30 PM  ? MICROALBUR 1.1 02/08/2014 02:49 PM  ?  ?Kidney Function ?Lab Results  ?Component Value Date/Time  ? CREATININE 0.59 04/05/2021 03:55 PM  ? CREATININE 0.76 03/13/2021 11:54 AM  ? CREATININE 0.83 09/07/2020 09:20 AM  ? CREATININE 0.77 02/12/2020 11:22 AM  ? GFR 75.63 03/13/2021 11:54 AM  ? GFRNONAA >60 04/05/2021 03:55 PM  ? GFRNONAA >60 09/07/2020 09:20 AM  ? GFRAA 99 12/05/2020 12:00 AM  ? GFRAA >60 08/14/2019 10:25 AM  ? ? ?Current antihyperglycemic regimen:  ?-Current medications: ?Novolin N 28 units before breakfast -  Query Appropriate, Query effective, Safe, Query accessible ?Novolin R 8 units in morning and at night - Query Appropriate, Query effective, Safe, Query accessible ?Metformin XR 500 mg 1 tablet twice daily - Appropriate, Query effective, Safe, Accessible ?What recent interventions/DTPs have been made to improve glycemic control:  ?Patient reports no changes ?Have there been any recent hospitalizations or ED visits since last visit with CPP? No ?Patient denies hypoglycemic symptoms, including None ?Patient denies hyperglycemic symptoms, including none ?How often are you checking your blood sugar? twice daily ?What are your blood sugars ranging?  ?Patient reports her sugars are around 130 in the mornings and in the evenings have been lower around 80 and if she has not had a snack around 60 ?During the week, how often does your blood glucose drop below 70?  Patient reports it will in the  evening if she does not have a snack ?Patient reports she has been having some vision issues dizziness and balance issues has been going for several tests to rule out Parkinson's she has a scan this coming [facility]

## 2021-08-04 ENCOUNTER — Other Ambulatory Visit (HOSPITAL_BASED_OUTPATIENT_CLINIC_OR_DEPARTMENT_OTHER): Payer: Self-pay

## 2021-08-07 ENCOUNTER — Ambulatory Visit: Payer: Medicare Other | Attending: Adult Health | Admitting: Physical Therapy

## 2021-08-07 DIAGNOSIS — M6281 Muscle weakness (generalized): Secondary | ICD-10-CM | POA: Insufficient documentation

## 2021-08-07 DIAGNOSIS — R2681 Unsteadiness on feet: Secondary | ICD-10-CM | POA: Diagnosis not present

## 2021-08-07 DIAGNOSIS — R2689 Other abnormalities of gait and mobility: Secondary | ICD-10-CM | POA: Insufficient documentation

## 2021-08-08 ENCOUNTER — Encounter: Payer: Self-pay | Admitting: Physical Therapy

## 2021-08-08 NOTE — Therapy (Signed)
[facility] ?[address] ?[place], [place], [address] ?Phone: [phone]   Fax:  [phone] ? ?Physical Therapy Treatment ? ?Patient Details  ?Name: Breanna Webster ?MRN: [id number] ?Date of Birth: 1946/02/06 ?Referring Provider (PT): Dohmeier, Asencion Partridge ? ? ?Encounter Date: 08/07/2021 ? ? PT End of Session - 08/08/21 2038   ? ? Visit Number 19   ? Number of Visits 23   ? Date for PT Re-Evaluation 09/20/21   ? Authorization Type Medicare/Generic Commercial   ? Authorization Time Period 08-07-21 - 09-20-21   ? Progress Note Due on Visit 20   ? PT Start Time 1405   ? PT Stop Time 8466   ? PT Time Calculation (min) 40 min   ? Equipment Utilized During Treatment Other (comment)   bar bells, pool noodle  ? Activity Tolerance Patient tolerated treatment well   ? Behavior During Therapy [facility] for tasks assessed/performed   ? ?  ?  ? ?  ? ? ?Past Medical History:  ?Diagnosis Date  ? Anxiety   ? Arthritis   ? "back" (04/22/2018)  ? Back pain   ? Blood transfusion without reported diagnosis   ? CAD (coronary artery disease)   ? a. 03/2018 s/p PCI/DES to the RCA (3.0x15 Onyx DES).  ? Carotid artery stenosis   ? Mild  ? Chest pain   ? Chronic lower back pain   ? Cirrhosis ([place])   ? Colon polyps   ? Diverticulitis   ? Diverticulosis   ? Esophageal thickening   ? seen on pre TAVR CT scan, also questionable cirrhosis. MRI recommended. Will refer to GI  ? Fatty liver   ? GERD (gastroesophageal reflux disease)   ? Grave's disease   ? History of colonic polyps 05/22/2017  ? History of hiatal hernia   ? Hypertension   ? Hypothyroidism   ? IBS (irritable bowel syndrome)   ? Osteopenia   ? Pulmonary nodules   ? seen on pre TAVR CT. likley benign. no follow up recommended if pt low risk.  ? S/P TAVR (transcatheter aortic valve replacement)   ? Severe aortic stenosis   ? Shortness of breath on exertion   ? Stroke [facility])   ? Thalassemia minor   ? Thyroid disease   ? Type II diabetes  mellitus ([place])   ? ? ?Past Surgical History:  ?Procedure Laterality Date  ? 3 HOUR [place] STUDY N/A 03/03/2018  ? Procedure: [place];  Surgeon: Mauri Pole, MD;  Location: [facility] ENDOSCOPY;  Service: Endoscopy;  Laterality: N/A;  ? AORTIC VALVE REPLACEMENT  06/2018  ? COLONOSCOPY    ? COLONOSCOPY W/ BIOPSIES AND POLYPECTOMY    ? CORONARY ANGIOGRAPHY Right 04/21/2018  ? Procedure: CORONARY ANGIOGRAPHY (CATH LAB);  Surgeon: [name], MD;  Location: [place] CV LAB;  Service: Cardiovascular;  Laterality: Right;  ? CORONARY STENT INTERVENTION N/A 04/22/2018  ? Procedure: CORONARY STENT INTERVENTION;  Surgeon: [name], MD;  Location: [place] CV LAB;  Service: Cardiovascular;  Laterality: N/A;  ? DILATION AND CURETTAGE OF UTERUS    ? ESOPHAGEAL MANOMETRY N/A 03/03/2018  ? Procedure: ESOPHAGEAL MANOMETRY (EM);  Surgeon: Mauri Pole, MD;  Location: [facility]OS[facility];  Service: Endoscopy;  Laterality: N/A;  ? GASTRIC FUNDOPLICATION    ? HERNIA REPAIR    ? HYSTEROSCOPY    ? fibroids  ? LAPAROSCOPIC CHOLECYSTECTOMY    ? LAPAROSCOPY    ? fibroids  ?  NISSEN FUNDOPLICATION  5993T  ? POLYPECTOMY    ? RIGHT/LEFT HEART CATH AND CORONARY ANGIOGRAPHY N/A 02/20/2018  ? Procedure: RIGHT/LEFT HEART CATH AND CORONARY ANGIOGRAPHY;  Surgeon: [name], MD;  Location: [place] CV LAB;  Service: Cardiovascular;  Laterality: N/A;  ? TEE WITHOUT CARDIOVERSION N/A 07/08/2018  ? Procedure: TRANSESOPHAGEAL ECHOCARDIOGRAM (TEE);  Surgeon: Burnell Blanks, MD;  Location: [place];  Service: Open Heart Surgery;  Laterality: N/A;  ? TEE WITHOUT CARDIOVERSION  10/07/2018  ? TEE WITHOUT CARDIOVERSION N/A 10/07/2018  ? Procedure: TRANSESOPHAGEAL ECHOCARDIOGRAM (TEE);  Surgeon: Jerline Pain, MD;  Location: [facility] ENDOSCOPY;  Service: Cardiovascular;  Laterality: N/A;  ? TONSILLECTOMY    ? TRANSCATHETER AORTIC VALVE REPLACEMENT, TRANSFEMORAL N/A 07/08/2018  ? Procedure: TRANSCATHETER AORTIC VALVE REPLACEMENT,  TRANSFEMORAL;  Surgeon: Burnell Blanks, MD;  Location: [place];  Service: Open Heart Surgery;  Laterality: N/A;  ? ? ?There were no vitals filed for this visit. ? ? Subjective Assessment - 08/08/21 2035   ? ? Subjective Pt reports she has a DAT scan scheduled on Thursday this week - states she is still being evaluated for Parkinson's; says Sinemet did not help the tremor in her LUE   ? Pertinent History significant arthritis in knees   ? Patient Stated Goals Pt's goals for therapy are to get better with walking and with arthritis.   ? Currently in Pain? Yes   ? Pain Score 4    ? Pain Location Knee   ? Pain Orientation Right   ? Pain Descriptors / Indicators Aching;Discomfort   ? Pain Type Chronic pain   ? Pain Onset More than a month ago   ? Pain Frequency Intermittent   ? ?  ?  ? ?  ? ? ? ? ?Aquatic therapy at [facility] - pool temp 90 degrees ? ?Patient seen for aquatic therapy today.  Treatment took place in water 3.5-4.5 feet deep depending upon activity.  Pt entered & exited the pool  ?the pool via step negotiation using step by step sequence for descension and ascension with use of hand rails with supervision ? ?Pt performed water walking 18' x 6 reps forwards across pool, using bar bells initially for approx. 4 reps and then without barbells to work on balance with gait; 18' x 2 reps with UE support with use of bar bells;  18' x 4 reps backwards across width of pool with UE support on bar bells ? ?Pt performed marching in place 10 reps with bil. UE support on pool edge prn;  marching forwards 18' x 2 reps with use of bar bells  ? ? ?Pt performed alternate tap ups to step 10 reps each leg with minimal UE support on hand rail; performed 10 reps each leg without UE support with close SBA from PT ? ?Pt performed Ai Chi postures "Enclosing" and "Gathering" 10 reps each; "Balancing" posture modified with pt given hand held assist for correct sequence and coordination of UE/LE movement - 5 reps  each side ? ?Pt performed squats at edge of pool 10 reps ? ?Pt performed water walking (forward) 18' x 2 reps with no UE support at end of session - 18' x 2 reps with cues to amb. At faster speed and to increase arm swing ? ?Pt requires buoyancy of water for support and safety with standing balance exercises; exercises are able to be safely attempted/performed in water without or with minimal UE support that cannot be performed safely  on land.  Viscosity of water is needed for resistance for strengthening.  Current of water provides perturbations for challenges with standing balance.  Buoyancy of water also provides joint offloading which increases comfort and reduces pain in knees with weight bearing activities and exs in water, whereas, this is unable to be achieved with weight bearing exercises on land. ? ? ? ? ? ? ? ? ? ? ? ? ? ? ? ? ? ? ? ? ? ? ? ? ? ? ? ? ? ? ? PT Short Term Goals - 08/08/21 2042   ? ?  ? PT SHORT TERM GOAL #1  ? Title Pt will be independent with HEP for improved strength and flexibility, balance, transfers, and gait.   TARGET 05/26/2021   ? Time 4   ? Period Weeks   ? Status Achieved   ?  ? PT SHORT TERM GOAL #2  ? Title Pt will improve Berg score to at least 41/56 to decrease fall risk.   ? Baseline 36/56   ? Time 4   ? Period Weeks   ? Status Achieved   ?  ? PT SHORT TERM GOAL #3  ? Title Pt will improve TUG score to less than or equal to 21 sec for decreased fall risk.   ? Baseline 26.88 sec   ? Time 4   ? Period Weeks   ? Status Achieved   ?  ? PT SHORT TERM GOAL #4  ? Title Pt will verbalize understanding of fall prevention in home environment.   ? Time 4   ? Period Weeks   ? Status Achieved   ? ?  ?  ? ?  ? ? ? ? PT Long Term Goals - 08/08/21 2043   ? ?  ? PT LONG TERM GOAL #1  ? Title Pt will be independent with HEP for improved flexibility and strength, balance, transfers, and gait.  TARGET 06/23/2021   ? Time 5   ? Period Weeks   ? Status Achieved   ? Target Date 07/18/21   ?  ? PT LONG  TERM GOAL #2  ? Title Pt will improve Berg score to at least 46/56 to decrease fall risk.   ? Time 5   ? Period Weeks   ? Status Achieved   47/56  ? Target Date 07/18/21   ?  ? PT LONG TERM GOAL #3  ? Titl

## 2021-08-09 ENCOUNTER — Other Ambulatory Visit (HOSPITAL_BASED_OUTPATIENT_CLINIC_OR_DEPARTMENT_OTHER): Payer: Self-pay

## 2021-08-09 ENCOUNTER — Encounter: Payer: Self-pay | Admitting: Hematology

## 2021-08-10 ENCOUNTER — Encounter (HOSPITAL_COMMUNITY)
Admission: RE | Admit: 2021-08-10 | Discharge: 2021-08-10 | Disposition: A | Discharge status: Home / Self Care | Payer: Medicare Other | Source: Ambulatory Visit | Attending: Neurology | Admitting: Neurology

## 2021-08-10 DIAGNOSIS — G20 Parkinson's disease: Secondary | ICD-10-CM | POA: Diagnosis not present

## 2021-08-10 DIAGNOSIS — I480 Paroxysmal atrial fibrillation: Secondary | ICD-10-CM

## 2021-08-10 DIAGNOSIS — Z952 Presence of prosthetic heart valve: Secondary | ICD-10-CM

## 2021-08-10 DIAGNOSIS — I69398 Other sequelae of cerebral infarction: Secondary | ICD-10-CM | POA: Diagnosis not present

## 2021-08-10 DIAGNOSIS — Z8673 Personal history of transient ischemic attack (TIA), and cerebral infarction without residual deficits: Secondary | ICD-10-CM

## 2021-08-10 DIAGNOSIS — R269 Unspecified abnormalities of gait and mobility: Secondary | ICD-10-CM | POA: Diagnosis not present

## 2021-08-10 DIAGNOSIS — G20C Parkinsonism, unspecified: Secondary | ICD-10-CM

## 2021-08-10 MED ORDER — POTASSIUM IODIDE (ANTIDOTE) 130 MG PO TABS
ORAL_TABLET | ORAL | Status: AC
  Filled 2021-08-10: qty 1

## 2021-08-10 MED ORDER — IOFLUPANE I 123 185 MBQ/2.5ML IV SOLN
4.8000 | Freq: Once | INTRAVENOUS | Status: AC | PRN
Start: 2021-08-10 — End: 2021-08-10
  Administered 2021-08-10: 4.8 via INTRAVENOUS
  Filled 2021-08-10: qty 5

## 2021-08-11 DIAGNOSIS — G20 Parkinson's disease: Secondary | ICD-10-CM | POA: Diagnosis not present

## 2021-08-11 DIAGNOSIS — R296 Repeated falls: Secondary | ICD-10-CM | POA: Diagnosis not present

## 2021-08-13 NOTE — Progress Notes (Signed)
DAT SCAN 08-12-2021 : Mild decreased activity in the posterior putamen and head of the ?RIGHT caudate nucleus. Equivocal exam but findings are suspicious ?for Parkinsonian pathology. ? ?RESULT: This is not alone diagnostic of PD , but can indicate an early stage indicated by mild reduction in the right brain dopaminergic pathway, which controls the left body and can account for left sided tremor.  ?I suggest to treat with Carbidopa/ Levodopa. ??

## 2021-08-14 ENCOUNTER — Ambulatory Visit (INDEPENDENT_AMBULATORY_CARE_PROVIDER_SITE_OTHER): Payer: Medicare Other | Admitting: Adult Health

## 2021-08-14 ENCOUNTER — Encounter: Payer: Self-pay | Admitting: Neurology

## 2021-08-14 ENCOUNTER — Encounter: Payer: Self-pay | Admitting: Adult Health

## 2021-08-14 ENCOUNTER — Other Ambulatory Visit (HOSPITAL_BASED_OUTPATIENT_CLINIC_OR_DEPARTMENT_OTHER): Payer: Self-pay

## 2021-08-14 ENCOUNTER — Encounter: Payer: Self-pay | Admitting: Hematology

## 2021-08-14 ENCOUNTER — Other Ambulatory Visit: Payer: Self-pay | Admitting: Neurology

## 2021-08-14 ENCOUNTER — Ambulatory Visit: Payer: Medicare Other | Admitting: Physical Therapy

## 2021-08-14 VITALS — BP 132/74 | HR 65 | Ht 63.0 in | Wt 176.0 lb

## 2021-08-14 DIAGNOSIS — Z8673 Personal history of transient ischemic attack (TIA), and cerebral infarction without residual deficits: Secondary | ICD-10-CM

## 2021-08-14 DIAGNOSIS — G20 Parkinson's disease: Secondary | ICD-10-CM

## 2021-08-14 MED ORDER — LEVOTHYROXINE SODIUM 137 MCG PO TABS
137.0000 ug | ORAL_TABLET | Freq: Every morning | ORAL | 3 refills | Status: DC
  Filled 2021-08-14: qty 90, 90d supply, fill #0
  Filled 2021-09-20: qty 30, 30d supply, fill #1

## 2021-08-14 NOTE — Patient Instructions (Addendum)
Your Plan: ? ?Continue current regimen - will have Dr. Brett Fairy follow up with you regarding test results and further treatment options ? ?Would recommend restart OT and continue PT ? ? ? ? ? ? ? ?Thank you for coming to see [name] at [facility]. I hope we have been able to provide you high quality care today. ? ?You may receive a patient satisfaction survey over the next few weeks. We would appreciate your feedback and comments so that we may continue to improve ourselves and the health of our patients. ? ?

## 2021-08-14 NOTE — Progress Notes (Signed)
?[facility] ?[address] ?[place]. [place] [address] ?[phone] ? ?     OFFICE FOLLOW UP NOTE ? ?[name] ?Date of Birth:  1945/06/22 ?Medical Record Number:  [id number]  ? ?Reason for Referral: stroke follow up ? ? ? ?CHIEF COMPLAINT:  ?Chief Complaint  ?Patient presents with  ? Follow-up  ?  Rm 3 here for 4 month f/u. Pt reports she has been doing ok; would like to discuss Dat scan results if available.   ? ? ? ?HPI: ? ?Update 08/14/2021 JM: Patient returns for 7-monthstroke follow-up accompanied by her husband. Her main concern today is in regards to recent DaTscan that was completed 4/20 ordered by Dr. DBrett Fairyfor LUE tremor.  She was seen by Dr. DBrett Fairylast month for LUE tremor, recommended trial of Sinemet but discontinued after about 1 week due to no benefit for tremor. Per DaTscan report, findings suspicious for parkinsonian pathology, recommended trial of Sinemet. She has not yet started this as she did not have prior benefit with it so wasn't sure if she needed to trial this again.  She had multiple questions at today's visit regarding DaTscan and possibility of Parkinson's, etiology of Parkinson's, voices frustration by the fact that she could not be told 100% if she has Parkinson's or not based on recent scan and multiple questions regarding treatment moving forward. She has a follow-up visit with Dr. DBrett Fairyin June but would like to discuss all of this sooner. Did work with OT after prior visit but placed on hold until imaging completed as they were unsure if they were treating her for essential tremor or Parkinson's. ? ?Continues to work with PT for imbalance and unsteadiness. Per husband, has noticed improvement of her balance doing aquatic therapy. She does continue to have bilateral knee pain which she feels may be contributing.  Continues to have occasional dizziness which is chronic as well as intermittent double vision.  Currently being followed by Dr.  GKaty Fitchand has follow-up visit next month. Denies new stroke/TIA symptoms or any other new neurological concerns.  Compliant on Eliquis and Repatha, denies side effects.  Blood pressure today 132/74.  Closely follows with cardiology and PCP.  No further concerns at this time. ? ? ? ? ?History provided for reference purposes only ?Update 04/13/2021 JM: Patient returns for acute visit due to increased falls and multiple other concerns.  ? ?Falls:  ?ED eval 12/14 post fall with c/o left flank pain and HA. CTH no acute finding. Lab work showed stable chronic anemia and hyperglycemia with improvement after D50. Fall occurred after "tripping over her feet" per husband. Per ED note, left orbital and left flank bruising. CT abdomen unremarkable. Reports fall 2 weeks ago where she lost her balance -felt like she was being pulled towards the left.  Denies associated dizziness at that time but does have chronic intermittent dizziness. Imbalance present since her stroke. Does admit to limited physical activity or exercise due to fear of falling - she reports she keeps busy going to different doctors appointments. She does report chronic b/l knee and lower back pain.  ?Chronic imbalance from stroke 2.5 years ago. She is concerned that this has worsened over the past 2 weeks and very concerned about possible new stroke. Denies any increased stressors or new medications.  ? ?Left Lower Breast Pain:  ?complaining of sharp left breast pain which started after fall last week. It is constant but worse with coughing, sneezing, lying down and  with deep breaths. Radiates into back and right midline anterior. CT abdomen obtained at ED visit which was unremarkable. She is trying to schedule an appt with her cardiologist. Has appt with PCP tomorrow morning.  ? ?Transient right eye vision loss:  ?chronic issue. Prior extensive work-up unremarkable. Plans on following up with Dr. Katy Fitch for further evaluation ? ?Left hand tremor, worsening:   ?present since prior stroke. Feels like this is worsening as it is "moving up my arm" where previously only in hand. Does not interfere with daily activity or functioning.  ? ? ?She reports compliance with Eliquis 5 mg twice daily. Plans on starting Repatha due to continued elevated LDL despite statin usage.  Both Eliquis and Repatha management cardiology. Followed by PCP for DM management - prior A1c 7.4. noted hypoglycemia during recent ED visit - denies this occurring at home.  ? ? ?No further concerns at this time ? ? ?Update 03/14/2021 AL: she called yesterday reporting a 5-15 minute episode of severe dizziness. Symptoms resolved spontaneously. She reports having a similar event 2-3 weeks ago of sudden dizziness. She reports not being able to stand up due to dizziness. She states that she was dry heaving. She was very nauseous. Symptoms lasted about 5 minutes. She also reports having "black out of right eye vision" that lasts 20 seconds twice over the past 2-3 weeks. Not associated with dizziness or headache. She has had a visual disturbance (husband states she has not mentioned this to him) a couple of times over the past few days where she sees a zig zag line in the right eye. Again, no headache or dizziness associated with visual disturbance. She had a similar event of right sided vision loss in 03/2020. CTA head and neck unremarkable for acute process. Right paratracheal nodule noted. She has seen optometry but not ophthalmologist. She does not drink much water due to overactive bladder. She drinks coffee in the mornings and usually tea at night.  ?  ?She continues Eliquis, diltiazem, losartan. BP was 148/71 at beginning of visit, repeated manually 15 minutes later and was 140/60. Orthostatics 125/74 P 77 lying, 119/66 P 78 with standing and patient reported dizziness, 115/62 P 83 standing (less dizzy) and 115/58 P 80 after three minutes standing (less dizzy). She reports allergy to statin therapy. She has  follow up echo with cardiology in 2 weeks. She was started on abx and steroids yesterday following visit with pulmonology for productive cough. BP was 112/72 at that visit.  ?  ?Update 06/22/2020 JM: Mrs. Gobin returns for stroke follow-up accompanied by her husband. ? ?Reports continued mild left hand tremor with mild left hand weakness -stable without worsening ?Prior complaints of gait unsteadiness -reports great improvement after working with PT -she has been able to increase ambulation distance - per husband, able to walk 3-4 blocks without stopping where previously difficulty ambulating very short distances ?She also reports occasional dizziness typically with position changes that last short duration ? ?She does report in December, she presented to the ED with acute onset right eye visual loss which lasted approximately 15 minutes and then completely resolved.  CTA head/neck unremarkable. Reports evaluation by ophthalmology with no concerning findings that may have possibly contributed.  She has not had any additional visual loss or visual symptoms ? ?Reports compliance on Eliquis 5 mg twice daily -denies bleeding or bruising ?Glucose levels stable with recent A1c 6.5 (04/2020) ?Routinely follows with cardiology, PCP and endocrinology ? ?Due to concerns of continued excessive  daytime fatigue, she was referred to [facility] for possible repeat sleep study as prior sleep study in 08/2018 was negative for sleep apnea.  Evaluated by Dr. Brett Fairy and underwent sleep study on 03/29/2020 which showed AHI 12.8 with REM sleep associated with continuous low oxygen saturation with total O2 desat time 70 minutes with SPO2 nadir 76% and mild PLM.  Underwent CPAP titration 04/21/2020 with apnea unresponsive to hypopneas, apnea and desaturations but OSA responded partially to BiPAP 14/10 with central sleep apnea present with higher BiPAP pressures, sleep-related hypoxemia improved greatly on CPAP and BiPAP, presence of  mild PLM confirmed.  Recommended initiating auto BiPAP which she started on 2/9.  She appears to be tolerating without difficulty but concerned as she continues to feel daytime fatigue. ? ?No further conce

## 2021-08-15 ENCOUNTER — Other Ambulatory Visit (HOSPITAL_BASED_OUTPATIENT_CLINIC_OR_DEPARTMENT_OTHER): Payer: Self-pay

## 2021-08-15 ENCOUNTER — Encounter: Payer: Self-pay | Admitting: Hematology

## 2021-08-15 ENCOUNTER — Telehealth: Payer: Self-pay | Admitting: Gastroenterology

## 2021-08-15 NOTE — Telephone Encounter (Signed)
Noted, thanks!

## 2021-08-15 NOTE — Telephone Encounter (Signed)
Inbound call from patient stating that she can not wait until her appointment on 6/2 at 10:30 with Dr. Havery Moros because she can not stop going to the bathroom. Patient stated she did not want to see the PA. Patient is requesting a call back to discuss if theres anyway she can be seen sooner. Please advise.  ?

## 2021-08-15 NOTE — Telephone Encounter (Signed)
Lm on vm for patient to return call. ? ?When pt returns call please give her new appt information - Friday, 08/18/21 at 3 pm. Please cancel June appt if pt is OK with that. Thanks ? ? ?

## 2021-08-15 NOTE — Telephone Encounter (Signed)
Patient returned call. Confirmed appt 4/28 ?

## 2021-08-16 ENCOUNTER — Other Ambulatory Visit (HOSPITAL_BASED_OUTPATIENT_CLINIC_OR_DEPARTMENT_OTHER): Payer: Self-pay

## 2021-08-16 ENCOUNTER — Other Ambulatory Visit: Payer: Self-pay | Admitting: Neurology

## 2021-08-16 MED ORDER — CARBIDOPA-LEVODOPA 25-250 MG PO TABS
1.0000 | ORAL_TABLET | Freq: Three times a day (TID) | ORAL | 5 refills | Status: DC
  Filled 2021-08-16: qty 90, 30d supply, fill #0
  Filled 2021-09-20: qty 270, 90d supply, fill #1

## 2021-08-16 NOTE — Telephone Encounter (Signed)
Called the pt. Breanna Webster was able to review the DAT scan results with the patient somewhat. I advised that I spoke with Dr Brett Fairy because the carbidopa levodopa was tried at lower strength for the pt and she didn't notice benefit. Dr Dohmeier would like to increase the dose of the medication and see if she notes benefit with that. The carbidopa levodopa will be sent in for 25/250 mg TID. Confirmed pharmacy on file. Pt verbalized understanding. ?She will keep [name] updated on if there are any problems. She was appreciative for the call and update. ?

## 2021-08-16 NOTE — Telephone Encounter (Signed)
-----   Message from Larey Seat, MD sent at 08/13/2021  1:12 PM EDT ----- ?DAT SCAN 08-12-2021 : Mild decreased activity in the posterior putamen and head of the ?RIGHT caudate nucleus. Equivocal exam but findings are suspicious ?for Parkinsonian pathology. ? ?RESULT: This is not alone diagnostic of PD , but can indicate an early stage indicated by mild reduction in the right brain dopaminergic pathway, which controls the left body and can account for left sided tremor.  ?I suggest to treat with Carbidopa/ Levodopa. ?? ?

## 2021-08-17 DIAGNOSIS — N952 Postmenopausal atrophic vaginitis: Secondary | ICD-10-CM | POA: Diagnosis not present

## 2021-08-17 DIAGNOSIS — N3941 Urge incontinence: Secondary | ICD-10-CM | POA: Diagnosis not present

## 2021-08-18 ENCOUNTER — Encounter: Payer: Self-pay | Admitting: Gastroenterology

## 2021-08-18 ENCOUNTER — Ambulatory Visit (INDEPENDENT_AMBULATORY_CARE_PROVIDER_SITE_OTHER): Payer: Medicare Other | Admitting: Gastroenterology

## 2021-08-18 VITALS — BP 120/60 | HR 72 | Ht 65.0 in | Wt 173.0 lb

## 2021-08-18 DIAGNOSIS — R194 Change in bowel habit: Secondary | ICD-10-CM | POA: Diagnosis not present

## 2021-08-18 DIAGNOSIS — K2270 Barrett's esophagus without dysplasia: Secondary | ICD-10-CM

## 2021-08-18 DIAGNOSIS — I2510 Atherosclerotic heart disease of native coronary artery without angina pectoris: Secondary | ICD-10-CM | POA: Diagnosis not present

## 2021-08-18 DIAGNOSIS — Z86010 Personal history of colonic polyps: Secondary | ICD-10-CM | POA: Diagnosis not present

## 2021-08-18 DIAGNOSIS — K7460 Unspecified cirrhosis of liver: Secondary | ICD-10-CM | POA: Diagnosis not present

## 2021-08-18 NOTE — Progress Notes (Signed)
? ?HPI :  ?75 y/o female here for a follow up visit - she has been seen her in the past for suspected cirrhosis related to NASH, short segment BE, history of colon polyps. In the recent years she has had a CVA, underwent TAVR, on Eliquis. ?  ?She states since the last time of seeing her she has been diagnosed with Parkinson's.  She has just been written a prescription for Sinemet and started this about 2 days ago.  Her main complaint is regular bowel habits for the past month or so.  She has had erratic bowel movements with full bowel movements that are loose at times, with episodes of incontinence.  This seems to be intermittent, will have some normal bowel movements but then urgent loose stools.  She denies any antibiotic use or sick contacts in recent months.  She denies any blood in her stools.  She is confident that her symptoms started several weeks before she started Sinemet.  Recall she has had 36 polyps removed on her last 2 colonoscopies, adenomas, she is due for a surveillance colonoscopy in October. ? ?She is been on omeprazole 20 mg daily, history of reflux and short segment Barrett's.  This is controlling reflux symptoms well and she is happy with the regimen. ? ?Recall she has been thought to have cirrhosis related to fatty liver based on multiple ultrasound imaging studies. She has been compensated over time. She underwent a serologic evaluation in 2017 which was negative.  She does not drink any alcohol.  She denies any family history of cirrhosis.  She has had no issues with ascites, jaundice, encephalopathy, nor varices.  She was immunized to hepatitis A and B in 2017.  Imaging of her abdomen with an ultrasound in Sept 2022, liver was cirrhotic with a 2 cm nodule for which MRI was recommended.  She underwent an MRI of her abdomen with and without contrast on February 04 2021.  Interestingly there is no concerning nodule in her liver but also her liver contour is smooth and there is no obvious  cirrhosis on the MRI.  Her spleen is normal.  ? ?Of note she had another CT scan of her abdomen pelvis with contrast since have last seen her, this time December 2022.  This was done for a fall and they were ruling out trauma.  She had reported "stable changes of cirrhosis" of her liver on that exam. ? ? Endoscopic history: ?EGD 02/03/19 - irregular zline, biopsies c/w nondysplastic BE, loose nissen, no varices, benign duodenal polyp ?Colonoscopy 02/03/19 - 8 small adenomas, diverticulosis, lipoma ?EGD 03/20/2017 - irregular z line, LA grade B esophagitis, small paraesophageal hernia, loose Nissen wrap, mild gastritis, duodenal erosions - path c/w Barrett's, H pylori negative  ?Colonoscopy 03/20/2017 - pancolonic diverticulosis, 28 polyps removed -  ?EGD 2002 - suspected segment of BE, no BE on biopsies ?EGD 2006 - esophagitis and gastritis, no evidence of BE ?Colonoscopy 5/05 - diverticulosis ?Colonoscopy 4/15 - 4 small adenomas ?  ? ? ?Past Medical History:  ?Diagnosis Date  ? Anxiety   ? Arthritis   ? "back" (04/22/2018)  ? Back pain   ? Blood transfusion without reported diagnosis   ? CAD (coronary artery disease)   ? a. 03/2018 s/p PCI/DES to the RCA (3.0x15 Onyx DES).  ? Carotid artery stenosis   ? Mild  ? Chest pain   ? Chronic lower back pain   ? Cirrhosis (HCC)   ? Colon polyps   ?   Diverticulitis   ? Diverticulosis   ? Esophageal thickening   ? seen on pre TAVR CT scan, also questionable cirrhosis. MRI recommended. Will refer to GI  ? Fatty liver   ? GERD (gastroesophageal reflux disease)   ? Grave's disease   ? History of colonic polyps 05/22/2017  ? History of hiatal hernia   ? Hypertension   ? Hypothyroidism   ? IBS (irritable bowel syndrome)   ? Osteopenia   ? Pulmonary nodules   ? seen on pre TAVR CT. likley benign. no follow up recommended if pt low risk.  ? S/P TAVR (transcatheter aortic valve replacement)   ? Severe aortic stenosis   ? Shortness of breath on exertion   ? Stroke [facility])   ? Thalassemia  minor   ? Thyroid disease   ? Type II diabetes mellitus ([place])   ? ? ? ?Past Surgical History:  ?Procedure Laterality Date  ? 63 HOUR [place] STUDY N/A 03/03/2018  ? Procedure: [place];  Surgeon: Mauri Pole, MD;  Location: [facility];  Service: Endoscopy;  Laterality: N/A;  ? AORTIC VALVE REPLACEMENT  06/2018  ? COLONOSCOPY    ? COLONOSCOPY W/ BIOPSIES AND POLYPECTOMY    ? CORONARY ANGIOGRAPHY Right 04/21/2018  ? Procedure: CORONARY ANGIOGRAPHY (CATH LAB);  Surgeon: [name], MD;  Location: [place] CV [facility];  Service: Cardiovascular;  Laterality: Right;  ? CORONARY STENT INTERVENTION N/A 04/22/2018  ? Procedure: CORONARY STENT INTERVENTION;  Surgeon: [name], MD;  Location: [place] CV LAB;  Service: Cardiovascular;  Laterality: N/A;  ? DILATION AND CURETTAGE OF UTERUS    ? ESOPHAGEAL MANOMETRY N/A 03/03/2018  ? Procedure: ESOPHAGEAL MANOMETRY (EM);  Surgeon: Mauri Pole, MD;  Location: [facility];  Service: Endoscopy;  Laterality: N/A;  ? GASTRIC FUNDOPLICATION    ? HERNIA REPAIR    ? HYSTEROSCOPY    ? fibroids  ? LAPAROSCOPIC CHOLECYSTECTOMY    ? LAPAROSCOPY    ? fibroids  ? NISSEN FUNDOPLICATION  3354T  ? POLYPECTOMY    ? RIGHT/LEFT HEART CATH AND CORONARY ANGIOGRAPHY N/A 02/20/2018  ? Procedure: RIGHT/LEFT HEART CATH AND CORONARY ANGIOGRAPHY;  Surgeon: [name], MD;  Location: [place] CV LAB;  Service: Cardiovascular;  Laterality: N/A;  ? TEE WITHOUT CARDIOVERSION N/A 07/08/2018  ? Procedure: TRANSESOPHAGEAL ECHOCARDIOGRAM (TEE);  Surgeon: Burnell Blanks, MD;  Location: [facility];  Service: Open Heart Surgery;  Laterality: N/A;  ? TEE WITHOUT CARDIOVERSION  10/07/2018  ? TEE WITHOUT CARDIOVERSION N/A 10/07/2018  ? Procedure: TRANSESOPHAGEAL ECHOCARDIOGRAM (TEE);  Surgeon: Jerline Pain, MD;  Location: [facility] ENDOSCOPY;  Service: Cardiovascular;  Laterality: N/A;  ? TONSILLECTOMY    ? TRANSCATHETER AORTIC VALVE REPLACEMENT, TRANSFEMORAL N/A 07/08/2018  ?  Procedure: TRANSCATHETER AORTIC VALVE REPLACEMENT, TRANSFEMORAL;  Surgeon: Burnell Blanks, MD;  Location: [facility];  Service: Open Heart Surgery;  Laterality: N/A;  ? ?Family History  ?Adopted: Yes  ?Problem Relation Age of Onset  ? Healthy Son   ?     x 2  ? Headache Other   ?     Cluster headaches  ? Heart failure Mother   ? Colon cancer Neg Hx   ? Pancreatic cancer Neg Hx   ? Rectal cancer Neg Hx   ? Stomach cancer Neg Hx   ? ?Social History  ? ?Tobacco Use  ? Smoking status: Never  ? Smokeless tobacco: Never  ?Vaping Use  ? Vaping Use: Never used  ?  Substance Use Topics  ? Alcohol use: No  ?  Alcohol/week: 0.0 standard drinks  ? Drug use: Never  ? ?Current Outpatient Medications  ?Medication Sig Dispense Refill  ? apixaban (ELIQUIS) 5 MG TABS tablet TAKE ONE TABLET BY MOUTH TWICE A DAY 60 tablet 2  ? b complex vitamins capsule Take 1 capsule by mouth daily.    ? BD PEN NEEDLE NANO 2ND GEN 32G X 4 MM MISC     ? Blood Glucose Monitoring Suppl (ONE TOUCH ULTRA 2) w/Device KIT Use to monitor blood glucose 1 kit 0  ? Blood Glucose Monitoring Suppl (ONETOUCH VERIO FLEX SYSTEM) w/Device KIT Use as directed 3 times daily 1 kit 0  ? carbidopa-levodopa (SINEMET) 25-250 MG tablet Take 1 tablet by mouth 3 (three) times daily. 90 tablet 5  ? cholecalciferol (VITAMIN D) 25 MCG (1000 UNIT) tablet Take 1,000 Units by mouth daily.    ? diltiazem (CARDIZEM CD) 120 MG 24 hr capsule Take 1 capsule (120 mg total) by mouth daily. 90 capsule 1  ? escitalopram (LEXAPRO) 20 MG tablet TAKE ONE TABLET BY MOUTH DAILY 90 tablet 1  ? estradiol (ESTRACE) 0.1 MG/GM vaginal cream Place vaginally.    ? Evolocumab (REPATHA SURECLICK) 140 MG/ML SOAJ Inject 1 dose into the skin every 14 (fourteen) days. 2 mL 11  ? glucose blood (ONETOUCH VERIO) test strip USE TO CHECK BLOOD SUGAR THREE TIMES DAILY 300 strip 4  ? glucose blood test strip Use to check blood sugar 3 times daily 300 each 3  ? Insulin Pen Needle (UNIFINE PENTIPS) 32G X 4 MM MISC  Inject 4 times subcutaneously 400 each 2  ? Insulin Pen Needle 32G X 4 MM MISC Use to inject 4 times daily 400 each 2  ? Insulin Syringe-Needle U-100 (ULTICARE INSULIN SYRINGE) 31G X 5/16" 1 ML MISC FOUR TIMES A

## 2021-08-18 NOTE — Patient Instructions (Signed)
Your provider has requested that you go to the basement level for lab work the week of 09/25/21 between the hours of 8 am-5 pm. Press "B" on the elevator. The lab is located at the first door on the left as you exit the elevator. ?_________________________________________ ?Please purchase the following medications over the counter and take as directed: ?Citrucel 1 heaping teaspoon dissolved in at least 8 ounces/water juice once daily. ?______________________________________________ ?Your provider has requested that you go to the basement level for lab work before leaving today. Press "B" on the elevator. The lab is located at the first door on the left as you exit the elevator. ?______________________________________________ ?You will be due for a recall endoscopy and colonoscopy in 01/2022. We will send you a reminder in the mail when it gets closer to that time. Should you have any symptoms in the mean time necessitating sooner evaluation, please call our office. ?______________________________________________ ?You have been scheduled for an RUQ abdominal ultrasound at [facility] (1st floor of hospital) on 10/18/21 at 9:00 am. Please arrive 15 minutes prior to your appointment for registration. Make certain not to have anything to eat or drink 6 hours prior to your appointment. Should you need to reschedule your appointment, please contact radiology at [phone]. This test typically takes about 30 minutes to perform. ? ? ?

## 2021-08-19 DIAGNOSIS — Z20828 Contact with and (suspected) exposure to other viral communicable diseases: Secondary | ICD-10-CM | POA: Diagnosis not present

## 2021-08-19 DIAGNOSIS — Z20822 Contact with and (suspected) exposure to covid-19: Secondary | ICD-10-CM | POA: Diagnosis not present

## 2021-08-21 ENCOUNTER — Ambulatory Visit: Payer: Medicare Other | Attending: Adult Health | Admitting: Physical Therapy

## 2021-08-21 DIAGNOSIS — R2689 Other abnormalities of gait and mobility: Secondary | ICD-10-CM

## 2021-08-21 DIAGNOSIS — R2681 Unsteadiness on feet: Secondary | ICD-10-CM

## 2021-08-21 DIAGNOSIS — M6281 Muscle weakness (generalized): Secondary | ICD-10-CM

## 2021-08-22 ENCOUNTER — Other Ambulatory Visit: Payer: Medicare Other

## 2021-08-22 ENCOUNTER — Encounter: Payer: Self-pay | Admitting: Physical Therapy

## 2021-08-22 DIAGNOSIS — R194 Change in bowel habit: Secondary | ICD-10-CM | POA: Diagnosis not present

## 2021-08-22 DIAGNOSIS — Z86010 Personal history of colonic polyps: Secondary | ICD-10-CM

## 2021-08-22 DIAGNOSIS — A09 Infectious gastroenteritis and colitis, unspecified: Secondary | ICD-10-CM | POA: Diagnosis not present

## 2021-08-22 NOTE — Therapy (Signed)
[facility] ?[place], [place], [address] ?Phone: [phone]   Fax:  [phone] ? ?Physical Therapy Treatment & 20th Visit Progress Note ? ?Progress Note ?Reporting Period 06-06-21 to 08-21-21 ? ?See note below for Objective Data and Assessment of Progress/Goals.  ? ?  ?Patient Details  ?Name: Breanna Webster ?MRN: [id number] ?Date of Birth: [birth year] ?Referring Provider (PT): Dohmeier, Asencion Partridge ? ? ?Encounter Date: 08/21/2021 ? ? PT End of Session - 08/22/21 1946   ? ? Visit Number 20   ? Number of Visits 23   ? Date for PT Re-Evaluation 09/20/21   ? Authorization Type Medicare/Generic Commercial   ? Authorization Time Period 08-07-21 - 09-20-21   ? Progress Note Due on Visit 20   ? PT Start Time 1400   ? PT Stop Time 9678   ? PT Time Calculation (min) 45 min   ? Equipment Utilized During Treatment Other (comment)   bar bells, pool noodle  ? Activity Tolerance Patient tolerated treatment well   ? Behavior During Therapy [facility] for tasks assessed/performed   ? ?  ?  ? ?  ? ? ?Past Medical History:  ?Diagnosis Date  ? Anxiety   ? Arthritis   ? "back" (04/22/2018)  ? Back pain   ? Blood transfusion without reported diagnosis   ? CAD (coronary artery disease)   ? a. 03/2018 s/p PCI/DES to the RCA (3.0x15 Onyx DES).  ? Carotid artery stenosis   ? Mild  ? Chest pain   ? Chronic lower back pain   ? Cirrhosis ([place])   ? Colon polyps   ? Diverticulitis   ? Diverticulosis   ? Esophageal thickening   ? seen on pre TAVR CT scan, also questionable cirrhosis. MRI recommended. Will refer to GI  ? Fatty liver   ? GERD (gastroesophageal reflux disease)   ? Grave's disease   ? History of colonic polyps 05/22/2017  ? History of hiatal hernia   ? Hypertension   ? Hypothyroidism   ? IBS (irritable bowel syndrome)   ? Osteopenia   ? Pulmonary nodules   ? seen on pre TAVR CT. likley benign. no follow up recommended if pt low risk.  ? S/P TAVR (transcatheter aortic valve  replacement)   ? Severe aortic stenosis   ? Shortness of breath on exertion   ? Stroke [facility])   ? Thalassemia minor   ? Thyroid disease   ? Type II diabetes mellitus ([place])   ? ? ?Past Surgical History:  ?Procedure Laterality Date  ? 53 HOUR [place] STUDY N/A 03/03/2018  ? Procedure: [place];  Surgeon: Mauri Pole, MD;  Location: [facility];  Service: Endoscopy;  Laterality: N/A;  ? AORTIC VALVE REPLACEMENT  06/2018  ? COLONOSCOPY    ? COLONOSCOPY W/ BIOPSIES AND POLYPECTOMY    ? CORONARY ANGIOGRAPHY Right 04/21/2018  ? Procedure: CORONARY ANGIOGRAPHY (CATH LAB);  Surgeon: [name], MD;  Location: [place] CV LAB;  Service: Cardiovascular;  Laterality: Right;  ? CORONARY STENT INTERVENTION N/A 04/22/2018  ? Procedure: CORONARY STENT INTERVENTION;  Surgeon: [name], MD;  Location: [place] CV LAB;  Service: Cardiovascular;  Laterality: N/A;  ? DILATION AND CURETTAGE OF UTERUS    ? ESOPHAGEAL MANOMETRY N/A 03/03/2018  ? Procedure: ESOPHAGEAL MANOMETRY (EM);  Surgeon: Mauri Pole, MD;  [facility];  Service: Endoscopy;  Laterality: N/A;  ? GASTRIC FUNDOPLICATION    ? HERNIA  REPAIR    ? HYSTEROSCOPY    ? fibroids  ? LAPAROSCOPIC CHOLECYSTECTOMY    ? LAPAROSCOPY    ? fibroids  ? NISSEN FUNDOPLICATION  [id number]  ? POLYPECTOMY    ? RIGHT/LEFT HEART CATH AND CORONARY ANGIOGRAPHY N/A 02/20/2018  ? Procedure: RIGHT/LEFT HEART CATH AND CORONARY ANGIOGRAPHY;  Surgeon: [name], MD;  Location: [place] CV LAB;  Service: Cardiovascular;  Laterality: N/A;  ? TEE WITHOUT CARDIOVERSION N/A 07/08/2018  ? Procedure: TRANSESOPHAGEAL ECHOCARDIOGRAM (TEE);  Surgeon: Burnell Blanks, MD;  Location: [place];  Service: Open Heart Surgery;  Laterality: N/A;  ? TEE WITHOUT CARDIOVERSION  10/07/2018  ? TEE WITHOUT CARDIOVERSION N/A 10/07/2018  ? Procedure: TRANSESOPHAGEAL ECHOCARDIOGRAM (TEE);  Surgeon: Jerline Pain, MD;  Location: [facility] ENDOSCOPY;  Service: Cardiovascular;   Laterality: N/A;  ? TONSILLECTOMY    ? TRANSCATHETER AORTIC VALVE REPLACEMENT, TRANSFEMORAL N/A 07/08/2018  ? Procedure: TRANSCATHETER AORTIC VALVE REPLACEMENT, TRANSFEMORAL;  Surgeon: Burnell Blanks, MD;  Location: [place];  Service: Open Heart Surgery;  Laterality: N/A;  ? ? ?There were no vitals filed for this visit. ? ? Subjective Assessment - 08/22/21 1944   ? ? Subjective Pt reports she has been diagnosed with Parkinson's; reports they have increased dosage of Sinemet but says it is not helping the tremor in her LUE   ? Pertinent History significant arthritis in knees   ? Patient Stated Goals Pt's goals for therapy are to get better with walking and with arthritis.   ? Currently in Pain? Yes   ? Pain Score 3    ? Pain Location Knee   ? Pain Orientation Right   ? Pain Descriptors / Indicators Aching;Discomfort   ? Pain Type Chronic pain   ? Pain Onset More than a month ago   ? Pain Frequency Intermittent   ? ?  ?  ? ?  ? ? ? ? ? ? ? ? ?Aquatic therapy at [facility] - pool temp 90 degrees ? ?Patient seen for aquatic therapy today.  Treatment took place in water 3.5-4.5 feet deep depending upon activity.  Pt entered & exited the pool  ?the pool via step negotiation using step by step sequence for descension and ascension with use of hand rails with supervision ? ?Pt performed water walking 18' x 6 reps forwards across pool, using bar bells initially for approx. 4 reps and then without barbells to work on balance with gait; 18' x 2 reps with UE support with use of bar bells;  18' x 4 reps backwards across width of pool with UE support on bar bells ? ?Pt performed marching in place 10 reps with bil. UE support on pool edge prn;  marching forwards 18' x 2 reps with use of bar bells  ? ?Pt performed standing balance exercises with UE support on edge of pool - marching in place 10 reps each leg; ? ?Pt performed water walking (forward) 18' x 2 reps with no UE support at end of session - 18' x 2  reps with cues to amb. At faster speed and to increase arm swing ? ?Pt requires buoyancy of water for support and safety with standing balance exercises; exercises are able to be safely attempted/performed in water without or with minimal UE support that cannot be performed safely on land.  Viscosity of water is needed for resistance for strengthening.  Current of water provides perturbations for challenges with standing balance.  Buoyancy of water also provides joint  offloading which increases comfort and reduces pain in knees with weight bearing activities and exs in water, whereas, this is unable to be achieved with weight bearing exercises on land. ? ? ? ? ? ? ? ? ? ? ? ? ? ? ? ? ? ? ? ? ? ? ? ? ? ? ? ? PT Short Term Goals - 08/22/21 1954   ? ?  ? PT [place] #1  ? Title Pt will be independent with HEP for improved strength and flexibility, balance, transfers, and gait.   TARGET 05/26/2021   ? Time 4   ? Period Weeks   ? Status Achieved   ?  ? PT SHORT TERM GOAL #2  ? Title Pt will improve Berg score to at least 41/56 to decrease fall risk.   ? Baseline 36/56   ? Time 4   ? Period Weeks   ? Status Achieved   ?  ? PT SHORT TERM GOAL #3  ? Title Pt will improve TUG score to less than or equal to 21 sec for decreased fall risk.   ? Baseline 26.88 sec   ? Time 4   ? Period Weeks   ? Status Achieved   ?  ? PT SHORT TERM GOAL #4  ? Title Pt will verbalize understanding of fall prevention in home environment.   ? Time 4   ? Period Weeks   ? Status Achieved   ? ?  ?  ? ?  ? ? ? ? PT Long Term Goals - 08/22/21 1955   ? ?  ? PT LONG TERM GOAL #1  ? Title Pt will be independent with HEP for improved flexibility and strength, balance, transfers, and gait.  TARGET 06/23/2021   ? Time 5   ? Period Weeks   ? Status Achieved   ? Target Date 07/18/21   ?  ? PT LONG TERM GOAL #2  ? Title Pt will improve Berg score to at least 46/56 to decrease fall risk.   ? Time 5   ? Period Weeks   ? Status Achieved   47/56  ? Target Date  07/18/21   ?  ? PT LONG TERM GOAL #3  ? Title Pt will improve TUG score to less than or equal to 16 sec for decreased fall risk.   ? Time 5   ? Period Weeks   ? Status On-going   18.43 sec  ? Target Date 07/18/21   ?

## 2021-08-23 LAB — FECAL LACTOFERRIN, QUANT
Fecal Lactoferrin: NEGATIVE
MICRO NUMBER:: 13340048
SPECIMEN QUALITY:: ADEQUATE

## 2021-08-25 ENCOUNTER — Other Ambulatory Visit (HOSPITAL_BASED_OUTPATIENT_CLINIC_OR_DEPARTMENT_OTHER): Payer: Self-pay

## 2021-08-25 DIAGNOSIS — Z20822 Contact with and (suspected) exposure to covid-19: Secondary | ICD-10-CM | POA: Diagnosis not present

## 2021-08-25 LAB — GI PROFILE, STOOL, PCR

## 2021-08-28 ENCOUNTER — Ambulatory Visit: Payer: Medicare Other | Admitting: Physical Therapy

## 2021-08-28 DIAGNOSIS — R2689 Other abnormalities of gait and mobility: Secondary | ICD-10-CM

## 2021-08-28 DIAGNOSIS — M6281 Muscle weakness (generalized): Secondary | ICD-10-CM | POA: Diagnosis not present

## 2021-08-28 DIAGNOSIS — E039 Hypothyroidism, unspecified: Secondary | ICD-10-CM | POA: Diagnosis not present

## 2021-08-28 DIAGNOSIS — R2681 Unsteadiness on feet: Secondary | ICD-10-CM

## 2021-08-28 DIAGNOSIS — E1149 Type 2 diabetes mellitus with other diabetic neurological complication: Secondary | ICD-10-CM | POA: Diagnosis not present

## 2021-08-29 ENCOUNTER — Other Ambulatory Visit: Payer: Self-pay

## 2021-08-29 ENCOUNTER — Encounter: Payer: Self-pay | Admitting: Physical Therapy

## 2021-08-29 DIAGNOSIS — D509 Iron deficiency anemia, unspecified: Secondary | ICD-10-CM

## 2021-08-29 NOTE — Therapy (Signed)
[place] [facility] ?[place], [place], [address] ?Phone: [phone]   Fax:  [phone] ? ?Physical Therapy Treatment ? ?Patient Details  ?Name: Breanna Webster ?MRN: [id number] ?Date of Birth: 1945-09-21 ?Referring Provider (PT): Dohmeier, Asencion Partridge ? ? ?Encounter Date: 08/28/2021 ? ? PT End of Session - 08/29/21 2027   ? ? Visit Number 21   ? Number of Visits 23   ? Date for PT Re-Evaluation 09/20/21   ? Authorization Type Medicare/Generic Commercial   ? Authorization Time Period 08-07-21 - 09-20-21   ? Progress Note Due on Visit 20   ? PT Start Time 2637   ? PT Stop Time 1450   ? PT Time Calculation (min) 47 min   ? Equipment Utilized During Treatment Other (comment)   bar bells, pool noodle  ? Activity Tolerance Patient tolerated treatment well   ? Behavior During Therapy [facility] for tasks assessed/performed   ? ?  ?  ? ?  ? ? ?Past Medical History:  ?Diagnosis Date  ? Anxiety   ? Arthritis   ? "back" (04/22/2018)  ? Back pain   ? Blood transfusion without reported diagnosis   ? CAD (coronary artery disease)   ? a. 03/2018 s/p PCI/DES to the RCA (3.0x15 Onyx DES).  ? Carotid artery stenosis   ? Mild  ? Chest pain   ? Chronic lower back pain   ? Cirrhosis ([place])   ? Colon polyps   ? Diverticulitis   ? Diverticulosis   ? Esophageal thickening   ? seen on pre TAVR CT scan, also questionable cirrhosis. MRI recommended. Will refer to GI  ? Fatty liver   ? GERD (gastroesophageal reflux disease)   ? Grave's disease   ? History of colonic polyps 05/22/2017  ? History of hiatal hernia   ? Hypertension   ? Hypothyroidism   ? IBS (irritable bowel syndrome)   ? Osteopenia   ? Pulmonary nodules   ? seen on pre TAVR CT. likley benign. no follow up recommended if pt low risk.  ? S/P TAVR (transcatheter aortic valve replacement)   ? Severe aortic stenosis   ? Shortness of breath on exertion   ? Stroke [facility])   ? Thalassemia minor   ? Thyroid disease   ? Type II diabetes  mellitus ([place])   ? ? ?Past Surgical History:  ?Procedure Laterality Date  ? 41 HOUR [place] STUDY N/A 03/03/2018  ? Procedure: [place];  Surgeon: Mauri Pole, MD;  [facility];  Service: Endoscopy;  Laterality: N/A;  ? AORTIC VALVE REPLACEMENT  06/2018  ? COLONOSCOPY    ? COLONOSCOPY W/ BIOPSIES AND POLYPECTOMY    ? CORONARY ANGIOGRAPHY Right 04/21/2018  ? Procedure: CORONARY ANGIOGRAPHY (CATH LAB);  Surgeon: [name], MD;  Location: [place] CV LAB;  Service: Cardiovascular;  Laterality: Right;  ? CORONARY STENT INTERVENTION N/A 04/22/2018  ? Procedure: CORONARY STENT INTERVENTION;  Surgeon: [name], MD;  Location: [place] CV LAB;  Service: Cardiovascular;  Laterality: N/A;  ? DILATION AND CURETTAGE OF UTERUS    ? ESOPHAGEAL MANOMETRY N/A 03/03/2018  ? Procedure: ESOPHAGEAL MANOMETRY (EM);  Surgeon: Mauri Pole, MD;  [facility];  Service: Endoscopy;  Laterality: N/A;  ? GASTRIC FUNDOPLICATION    ? HERNIA REPAIR    ? HYSTEROSCOPY    ? fibroids  ? LAPAROSCOPIC CHOLECYSTECTOMY    ? LAPAROSCOPY    ? fibroids  ?  NISSEN FUNDOPLICATION  3007M  ? POLYPECTOMY    ? RIGHT/LEFT HEART CATH AND CORONARY ANGIOGRAPHY N/A 02/20/2018  ? Procedure: RIGHT/LEFT HEART CATH AND CORONARY ANGIOGRAPHY;  Surgeon: [name], MD;  Location: [place] CV LAB;  Service: Cardiovascular;  Laterality: N/A;  ? TEE WITHOUT CARDIOVERSION N/A 07/08/2018  ? Procedure: TRANSESOPHAGEAL ECHOCARDIOGRAM (TEE);  Surgeon: Burnell Blanks, MD;  Location: [place];  Service: Open Heart Surgery;  Laterality: N/A;  ? TEE WITHOUT CARDIOVERSION  10/07/2018  ? TEE WITHOUT CARDIOVERSION N/A 10/07/2018  ? Procedure: TRANSESOPHAGEAL ECHOCARDIOGRAM (TEE);  Surgeon: Jerline Pain, MD;  Location: [facility] ENDOSCOPY;  Service: Cardiovascular;  Laterality: N/A;  ? TONSILLECTOMY    ? TRANSCATHETER AORTIC VALVE REPLACEMENT, TRANSFEMORAL N/A 07/08/2018  ? Procedure: TRANSCATHETER AORTIC VALVE REPLACEMENT,  TRANSFEMORAL;  Surgeon: Burnell Blanks, MD;  Location: [place];  Service: Open Heart Surgery;  Laterality: N/A;  ? ? ?There were no vitals filed for this visit. ? ? Subjective Assessment - 08/29/21 2026   ? ? Subjective Pt reports she feels her balance is better; says husband tells her she is walking better; pt not using cane today   ? Pertinent History significant arthritis in knees   ? Patient Stated Goals Pt's goals for therapy are to get better with walking and with arthritis.   ? Currently in Pain? No/denies   ? Pain Onset More than a month ago   ? ?  ?  ? ?  ? ? ? ? ? ? ?Aquatic therapy at [facility] - pool temp 90 degrees ? ?Patient seen for aquatic therapy today.  Treatment took place in water 3.5-4.5 feet deep depending upon activity.  Pt entered & exited the pool  ?the pool via step negotiation using step by step sequence for descension and ascension with use of hand rails with supervision ? ?Runner's stretch each leg 30 sec hold x 1 rep for hamstring and heel cord stretching; gastroc stretching 30 sec hold x 1 rep each leg with toes on wall ? ?Pt performed water walking 18' x 4 reps forwards across pool, using bar bells initially for approx. 4 reps and then without barbells to work on balance with gait; 18' x 4  reps with UE support with use of bar bells;  18' x 4 reps backwards across width of pool with UE support on bar bells ? ?Sidestepping 18' x 2 reps across pool without barbells with CGA ? ?Pt performed marching in place 10 reps with bil. UE support on pool edge prn;  marching forwards 18' x 2 reps with use of bar bells  ? ?Stepping strategy forward and back x 5 reps each; stepping laterally out/in 5 reps each leg ? ?Pt performed squats at edge of pool 10 reps ? ?Pt performed water walking (forward) 18' x 2 reps with no UE support at end of session - 18' x 2 reps with cues to amb. With large step length and to increase arm swing ? ?Pt requires buoyancy of water for support and  safety with standing balance exercises; exercises are able to be safely attempted/performed in water without or with minimal UE support that cannot be performed safely on land.  Viscosity of water is needed for resistance for strengthening.  Current of water provides perturbations for challenges with standing balance.  Buoyancy of water also provides joint offloading which increases comfort and reduces pain in knees with weight bearing activities and exs in water, whereas, this is unable to be  achieved with weight bearing exercises on land. ? ? ? ? ? ? ? ? ? ? ? ? ? ? ? ? ? ? ? ? ? ? ? ? ? ? ? ? ? PT Short Term Goals - 08/29/21 2033   ? ?  ? PT SHORT TERM GOAL #1  ? Title Pt will be independent with HEP for improved strength and flexibility, balance, transfers, and gait.   TARGET 05/26/2021   ? Time 4   ? Period Weeks   ? Status Achieved   ?  ? PT SHORT TERM GOAL #2  ? Title Pt will improve Berg score to at least 41/56 to decrease fall risk.   ? Baseline 36/56   ? Time 4   ? Period Weeks   ? Status Achieved   ?  ? PT SHORT TERM GOAL #3  ? Title Pt will improve TUG score to less than or equal to 21 sec for decreased fall risk.   ? Baseline 26.88 sec   ? Time 4   ? Period Weeks   ? Status Achieved   ?  ? PT SHORT TERM GOAL #4  ? Title Pt will verbalize understanding of fall prevention in home environment.   ? Time 4   ? Period Weeks   ? Status Achieved   ? ?  ?  ? ?  ? ? ? ? PT Long Term Goals - 08/29/21 2033   ? ?  ? PT LONG TERM GOAL #1  ? Title Pt will be independent with HEP for improved flexibility and strength, balance, transfers, and gait.  TARGET 06/23/2021   ? Time 5   ? Period Weeks   ? Status Achieved   ? Target Date 07/18/21   ?  ? PT LONG TERM GOAL #2  ? Title Pt will improve Berg score to at least 46/56 to decrease fall risk.   ? Time 5   ? Period Weeks   ? Status Achieved   47/56  ? Target Date 07/18/21   ?  ? PT LONG TERM GOAL #3  ? Title Pt will improve TUG score to less than or equal to 16 sec for  decreased fall risk.   ? Time 5   ? Period Weeks   ? Status On-going   18.43 sec  ? Target Date 07/18/21   ?  ? PT LONG TERM GOAL #4  ? Title Pt will improve gait velocity to at least 2 ft/sec for improved gait ef

## 2021-09-04 ENCOUNTER — Ambulatory Visit: Payer: Medicare Other | Admitting: Physical Therapy

## 2021-09-04 DIAGNOSIS — Z794 Long term (current) use of insulin: Secondary | ICD-10-CM | POA: Diagnosis not present

## 2021-09-04 DIAGNOSIS — Z6829 Body mass index (BMI) 29.0-29.9, adult: Secondary | ICD-10-CM | POA: Diagnosis not present

## 2021-09-04 DIAGNOSIS — R2681 Unsteadiness on feet: Secondary | ICD-10-CM

## 2021-09-04 DIAGNOSIS — Z952 Presence of prosthetic heart valve: Secondary | ICD-10-CM | POA: Diagnosis not present

## 2021-09-04 DIAGNOSIS — R2689 Other abnormalities of gait and mobility: Secondary | ICD-10-CM | POA: Diagnosis not present

## 2021-09-04 DIAGNOSIS — E782 Mixed hyperlipidemia: Secondary | ICD-10-CM | POA: Diagnosis not present

## 2021-09-04 DIAGNOSIS — I2510 Atherosclerotic heart disease of native coronary artery without angina pectoris: Secondary | ICD-10-CM | POA: Diagnosis not present

## 2021-09-04 DIAGNOSIS — M6281 Muscle weakness (generalized): Secondary | ICD-10-CM | POA: Diagnosis not present

## 2021-09-04 DIAGNOSIS — I4891 Unspecified atrial fibrillation: Secondary | ICD-10-CM | POA: Diagnosis not present

## 2021-09-04 DIAGNOSIS — E039 Hypothyroidism, unspecified: Secondary | ICD-10-CM | POA: Diagnosis not present

## 2021-09-04 DIAGNOSIS — Z8673 Personal history of transient ischemic attack (TIA), and cerebral infarction without residual deficits: Secondary | ICD-10-CM | POA: Diagnosis not present

## 2021-09-04 DIAGNOSIS — E1149 Type 2 diabetes mellitus with other diabetic neurological complication: Secondary | ICD-10-CM | POA: Diagnosis not present

## 2021-09-05 NOTE — Therapy (Signed)
[facility], [place], [address] ?Phone: [phone]   Fax:  [phone] ? ?Physical Therapy Treatment ? ?Patient Details  ?Name: Breanna Webster ?MRN: [id number] ?Date of Birth: [birth year] ?Referring Provider (PT): Dohmeier, Asencion Partridge ? ? ?Encounter Date: 09/04/2021 ? ? PT End of Session - 09/05/21 1909   ? ? Visit Number 22   ? Number of Visits 23   ? Date for PT Re-Evaluation 09/20/21   ? Authorization Type Medicare/Generic Commercial   ? Authorization Time Period 08-07-21 - 09-20-21   ? Progress Note Due on Visit 20   ? PT Start Time 1400   ? PT Stop Time 7341   ? PT Time Calculation (min) 45 min   ? Equipment Utilized During Treatment Other (comment)   bar bells, pool noodle  ? Activity Tolerance Patient tolerated treatment well   ? Behavior During Therapy [facility] for tasks assessed/performed   ? ?  ?  ? ?  ? ? ?Past Medical History:  ?Diagnosis Date  ? Anxiety   ? Arthritis   ? "back" (04/22/2018)  ? Back pain   ? Blood transfusion without reported diagnosis   ? CAD (coronary artery disease)   ? a. 03/2018 s/p PCI/DES to the RCA (3.0x15 Onyx DES).  ? Carotid artery stenosis   ? Mild  ? Chest pain   ? Chronic lower back pain   ? Cirrhosis ([place])   ? Colon polyps   ? Diverticulitis   ? Diverticulosis   ? Esophageal thickening   ? seen on pre TAVR CT scan, also questionable cirrhosis. MRI recommended. Will refer to GI  ? Fatty liver   ? GERD (gastroesophageal reflux disease)   ? Grave's disease   ? History of colonic polyps 05/22/2017  ? History of hiatal hernia   ? Hypertension   ? Hypothyroidism   ? IBS (irritable bowel syndrome)   ? Osteopenia   ? Pulmonary nodules   ? seen on pre TAVR CT. likley benign. no follow up recommended if pt low risk.  ? S/P TAVR (transcatheter aortic valve replacement)   ? Severe aortic stenosis   ? Shortness of breath on exertion   ? Stroke [facility])   ? Thalassemia minor   ? Thyroid disease   ? Type II diabetes  mellitus ([place])   ? ? ?Past Surgical History:  ?Procedure Laterality Date  ? 68 HOUR [place] STUDY N/A 03/03/2018  ? Procedure: [place];  Surgeon: Mauri Pole, MD;  [facility];  Service: Endoscopy;  Laterality: N/A;  ? AORTIC VALVE REPLACEMENT  06/2018  ? COLONOSCOPY    ? COLONOSCOPY W/ BIOPSIES AND POLYPECTOMY    ? CORONARY ANGIOGRAPHY Right 04/21/2018  ? Procedure: CORONARY ANGIOGRAPHY (CATH LAB);  Surgeon: [name], MD;  Location: [place] CV LAB;  Service: Cardiovascular;  Laterality: Right;  ? CORONARY STENT INTERVENTION N/A 04/22/2018  ? Procedure: CORONARY STENT INTERVENTION;  Surgeon: [name], MD;  Location: [place] CV LAB;  Service: Cardiovascular;  Laterality: N/A;  ? DILATION AND CURETTAGE OF UTERUS    ? ESOPHAGEAL MANOMETRY N/A 03/03/2018  ? Procedure: ESOPHAGEAL MANOMETRY (EM);  Surgeon: Mauri Pole, MD;  [facility];  Service: Endoscopy;  Laterality: N/A;  ? GASTRIC FUNDOPLICATION    ? HERNIA REPAIR    ? HYSTEROSCOPY    ? fibroids  ? LAPAROSCOPIC CHOLECYSTECTOMY    ? LAPAROSCOPY    ? fibroids  ?  NISSEN FUNDOPLICATION  8242P  ? POLYPECTOMY    ? RIGHT/LEFT HEART CATH AND CORONARY ANGIOGRAPHY N/A 02/20/2018  ? Procedure: RIGHT/LEFT HEART CATH AND CORONARY ANGIOGRAPHY;  Surgeon: [name], MD;  Location: [place] CV LAB;  Service: Cardiovascular;  Laterality: N/A;  ? TEE WITHOUT CARDIOVERSION N/A 07/08/2018  ? Procedure: TRANSESOPHAGEAL ECHOCARDIOGRAM (TEE);  Surgeon: Burnell Blanks, MD;  Location: [place];  Service: Open Heart Surgery;  Laterality: N/A;  ? TEE WITHOUT CARDIOVERSION  10/07/2018  ? TEE WITHOUT CARDIOVERSION N/A 10/07/2018  ? Procedure: TRANSESOPHAGEAL ECHOCARDIOGRAM (TEE);  Surgeon: Jerline Pain, MD;  Location: [facility] ENDOSCOPY;  Service: Cardiovascular;  Laterality: N/A;  ? TONSILLECTOMY    ? TRANSCATHETER AORTIC VALVE REPLACEMENT, TRANSFEMORAL N/A 07/08/2018  ? Procedure: TRANSCATHETER AORTIC VALVE REPLACEMENT,  TRANSFEMORAL;  Surgeon: Burnell Blanks, MD;  Location: [place];  Service: Open Heart Surgery;  Laterality: N/A;  ? ? ?There were no vitals filed for this visit. ? ? Subjective Assessment - 09/05/21 1908   ? ? Subjective Pt reports no problems or changes - pt states she would like to have 2 visits the end of July and then 2 in August before she goes on her trip   ? Pertinent History significant arthritis in knees   ? Patient Stated Goals Pt's goals for therapy are to get better with walking and with arthritis.   ? Currently in Pain? No/denies   ? Pain Onset More than a month ago   ? ?  ?  ? ?  ? ? ? ? ? ? ? ? ?Aquatic therapy at [facility] - pool temp 90 degrees ? ?Patient seen for aquatic therapy today.  Treatment took place in water 3.5-4.5 feet deep depending upon activity.  Pt entered & exited the pool  ?the pool via step negotiation using step by step sequence for descension and ascension with use of hand rails with supervision ? ?Pt performed water walking 18' x 6 reps forwards across pool, using bar bells initially for approx. 4 reps and then without barbells to work on balance with gait; 18' x 2 reps with UE support with use of bar bells;  18' x 4 reps backwards across width of pool with UE support on bar bells ? ?Pt performed marching in place 10 reps with bil. UE support on pool edge prn;  marching forwards 18' x 2 reps with use of bar bells  ? ?Pt performed standing balance exercises with UE support on edge of pool - marching in place 10 reps each leg; ? ?Pt performed water walking (forward) 18' x 2 reps with no UE support at end of session - 18' x 2 reps with cues to amb. At faster speed and to increase arm swing ? ?Pt requires buoyancy of water for support and safety with standing balance exercises; exercises are able to be safely attempted/performed in water without or with minimal UE support that cannot be performed safely on land.  Viscosity of water is needed for resistance for  strengthening.  Current of water provides perturbations for challenges with standing balance.  Buoyancy of water also provides joint offloading which increases comfort and reduces pain in knees with weight bearing activities and exs in water, whereas, this is unable to be achieved with weight bearing exercises on land. ? ? ? ? ? ? ? ? ? ? ? ? ? ? ? ? ? ? ? ? ? ? ? ? ? ? PT Short Term Goals - 09/05/21  1913   ? ?  ? PT SHORT TERM GOAL #1  ? Title Pt will be independent with HEP for improved strength and flexibility, balance, transfers, and gait.   TARGET 05/26/2021   ? Time 4   ? Period Weeks   ? Status Achieved   ?  ? PT SHORT TERM GOAL #2  ? Title Pt will improve Berg score to at least 41/56 to decrease fall risk.   ? Baseline 36/56   ? Time 4   ? Period Weeks   ? Status Achieved   ?  ? PT SHORT TERM GOAL #3  ? Title Pt will improve TUG score to less than or equal to 21 sec for decreased fall risk.   ? Baseline 26.88 sec   ? Time 4   ? Period Weeks   ? Status Achieved   ?  ? PT SHORT TERM GOAL #4  ? Title Pt will verbalize understanding of fall prevention in home environment.   ? Time 4   ? Period Weeks   ? Status Achieved   ? ?  ?  ? ?  ? ? ? ? PT Long Term Goals - 09/05/21 1913   ? ?  ? PT LONG TERM GOAL #1  ? Title Pt will be independent with HEP for improved flexibility and strength, balance, transfers, and gait.  TARGET 06/23/2021   ? Time 5   ? Period Weeks   ? Status Achieved   ? Target Date 07/18/21   ?  ? PT LONG TERM GOAL #2  ? Title Pt will improve Berg score to at least 46/56 to decrease fall risk.   ? Time 5   ? Period Weeks   ? Status Achieved   47/56  ? Target Date 07/18/21   ?  ? PT LONG TERM GOAL #3  ? Title Pt will improve TUG score to less than or equal to 16 sec for decreased fall risk.   ? Time 5   ? Period Weeks   ? Status On-going   18.43 sec  ? Target Date 07/18/21   ?  ? PT LONG TERM GOAL #4  ? Title Pt will improve gait velocity to at least 2 ft/sec for improved gait efficiency and safety.   ?  Baseline 1.73 ft/sec   ? Time 9   ? Period Weeks   ? Status Achieved   2.69 ft/s  ?  ? PT LONG TERM GOAL #5  ? Title Pt will verbalize plans for continued community fitness upon d/c to maximize gains made i

## 2021-09-06 ENCOUNTER — Ambulatory Visit: Payer: Medicare Other | Admitting: Hematology

## 2021-09-06 ENCOUNTER — Other Ambulatory Visit: Payer: Medicare Other

## 2021-09-06 ENCOUNTER — Inpatient Hospital Stay: Payer: Medicare Other | Attending: Hematology

## 2021-09-06 ENCOUNTER — Inpatient Hospital Stay (HOSPITAL_BASED_OUTPATIENT_CLINIC_OR_DEPARTMENT_OTHER): Payer: Medicare Other | Admitting: Hematology

## 2021-09-06 ENCOUNTER — Other Ambulatory Visit: Payer: Self-pay

## 2021-09-06 VITALS — BP 120/65 | HR 68 | Temp 97.5°F | Resp 18 | Wt 176.0 lb

## 2021-09-06 DIAGNOSIS — E785 Hyperlipidemia, unspecified: Secondary | ICD-10-CM

## 2021-09-06 DIAGNOSIS — Z8673 Personal history of transient ischemic attack (TIA), and cerebral infarction without residual deficits: Secondary | ICD-10-CM | POA: Insufficient documentation

## 2021-09-06 DIAGNOSIS — E538 Deficiency of other specified B group vitamins: Secondary | ICD-10-CM | POA: Diagnosis not present

## 2021-09-06 DIAGNOSIS — D509 Iron deficiency anemia, unspecified: Secondary | ICD-10-CM

## 2021-09-06 DIAGNOSIS — D563 Thalassemia minor: Secondary | ICD-10-CM

## 2021-09-06 DIAGNOSIS — E119 Type 2 diabetes mellitus without complications: Secondary | ICD-10-CM | POA: Insufficient documentation

## 2021-09-06 LAB — CBC WITH DIFFERENTIAL (CANCER CENTER ONLY)
Abs Immature Granulocytes: 0.02 10*3/uL (ref 0.00–0.07)
Basophils Absolute: 0 10*3/uL (ref 0.0–0.1)
Basophils Relative: 1 %
Eosinophils Absolute: 0.1 10*3/uL (ref 0.0–0.5)
Eosinophils Relative: 2 %
HCT: 34.9 % — ABNORMAL LOW (ref 36.0–46.0)
Hemoglobin: 11 g/dL — ABNORMAL LOW (ref 12.0–15.0)
Immature Granulocytes: 0 %
Lymphocytes Relative: 16 %
Lymphs Abs: 1.1 10*3/uL (ref 0.7–4.0)
MCH: 20.4 pg — ABNORMAL LOW (ref 26.0–34.0)
MCHC: 31.5 g/dL (ref 30.0–36.0)
MCV: 64.7 fL — ABNORMAL LOW (ref 80.0–100.0)
Monocytes Absolute: 0.5 10*3/uL (ref 0.1–1.0)
Monocytes Relative: 7 %
Neutro Abs: 5.2 10*3/uL (ref 1.7–7.7)
Neutrophils Relative %: 74 %
Platelet Count: 179 10*3/uL (ref 150–400)
RBC: 5.39 MIL/uL — ABNORMAL HIGH (ref 3.87–5.11)
RDW: 15.7 % — ABNORMAL HIGH (ref 11.5–15.5)
WBC Count: 6.9 10*3/uL (ref 4.0–10.5)
nRBC: 0.4 % — ABNORMAL HIGH (ref 0.0–0.2)

## 2021-09-06 LAB — CMP (CANCER CENTER ONLY)
ALT: 31 U/L (ref 0–44)
AST: 27 U/L (ref 15–41)
Albumin: 4.1 g/dL (ref 3.5–5.0)
Alkaline Phosphatase: 96 U/L (ref 38–126)
Anion gap: 9 (ref 5–15)
BUN: 18 mg/dL (ref 8–23)
CO2: 26 mmol/L (ref 22–32)
Calcium: 10.4 mg/dL — ABNORMAL HIGH (ref 8.9–10.3)
Chloride: 104 mmol/L (ref 98–111)
Creatinine: 0.69 mg/dL (ref 0.44–1.00)
GFR, Estimated: >60 mL/min (ref >60)
Glucose, Bld: 181 mg/dL — ABNORMAL HIGH (ref 70–99)
Potassium: 4.1 mmol/L (ref 3.5–5.1)
Sodium: 139 mmol/L (ref 135–145)
Total Bilirubin: 0.9 mg/dL (ref 0.3–1.2)
Total Protein: 7.1 g/dL (ref 6.5–8.1)

## 2021-09-06 LAB — LIPID PANEL
Cholesterol: 106 mg/dL (ref 0–200)
HDL: 50 mg/dL (ref >40)
LDL Cholesterol: 36 mg/dL (ref 0–99)
Total CHOL/HDL Ratio: 2.1 RATIO
Triglycerides: 101 mg/dL (ref <150)
VLDL: 20 mg/dL (ref 0–40)

## 2021-09-06 LAB — IRON AND IRON BINDING CAPACITY (CC-WL,HP ONLY)
Iron: 97 ug/dL (ref 28–170)
Saturation Ratios: 29 % (ref 10.4–31.8)
TIBC: 335 ug/dL (ref 250–450)
UIBC: 238 ug/dL (ref 148–442)

## 2021-09-06 LAB — FERRITIN: Ferritin: 86 ng/mL (ref 11–307)

## 2021-09-06 NOTE — Progress Notes (Signed)
HEMATOLOGY/ONCOLOGY CLINIC NOTE  Date of Service: 09/06/2021  Patient Care Team: [name], NP as PCP - General (Family Medicine) [name], MD as PCP - Cardiology (Cardiology) Madelin Rear, MD as Consulting Physician (Endocrinology) [name], RN as Case Manager Garvin Fila, MD as Referring Physician (Neurology) Viona Gilmore, [facility] as Pharmacist (Pharmacist)  REFERRING PHYSICIAN: [name], NP  CHIEF COMPLAINTS/PURPOSE OF CONSULTATION:    Beta Thalassemia minor IDA  HISTORY OF PRESENTING ILLNESS:  Please see previous note for details on initial presentation  INTERVAL HISTORY: Breanna Webster is a 76 y.o. female here for evaluation and management of BetaThalassemia minor and IDA. The patient's last visit with [name] was on 09/07/2020. She reports She is doing well with no new symptoms or concerns.  She reports resting tremors have not improved since being put on Sinemet.  She notes some fatigue that causes her to take naps.  No black or bloody stools. No other new or acute focal symptoms.  Lab results today 09/07/2020 discussed in detail. CBC stable. CMP unremarkable. Ferritin of 86 Iron sat ratio of 29%.  MEDICAL HISTORY:  Past Medical History:  Diagnosis Date   Anxiety    Arthritis    "back" (04/22/2018)   Back pain    Blood transfusion without reported diagnosis    CAD (coronary artery disease)    a. 03/2018 s/p PCI/DES to the RCA (3.0x15 Onyx DES).   Carotid artery stenosis    Mild   Chest pain    Chronic lower back pain    Cirrhosis ([facility])    Colon polyps    Diverticulitis    Diverticulosis    Esophageal thickening    seen on pre TAVR CT scan, also questionable cirrhosis. MRI recommended. Will refer to GI   Fatty liver    GERD (gastroesophageal reflux disease)    Grave's disease    History of colonic polyps 05/22/2017   History of hiatal hernia    Hypertension    Hypothyroidism    IBS (irritable bowel syndrome)     Osteopenia    Pulmonary nodules    seen on pre TAVR CT. likley benign. no follow up recommended if pt low risk.   S/P TAVR (transcatheter aortic valve replacement)    Severe aortic stenosis    Shortness of breath on exertion    Stroke ([facility])    Thalassemia minor    Thyroid disease    Type II diabetes mellitus ([place])      SURGICAL HISTORY: Past Surgical History:  Procedure Laterality Date   70 HOUR [place] STUDY N/A 03/03/2018   Procedure: 24 HOUR [place];  Surgeon: Mauri Pole, MD;  [facility];  Service: Endoscopy;  Laterality: N/A;   AORTIC VALVE REPLACEMENT  06/2018   COLONOSCOPY     COLONOSCOPY W/ BIOPSIES AND POLYPECTOMY     CORONARY ANGIOGRAPHY Right 04/21/2018   Procedure: CORONARY ANGIOGRAPHY (CATH LAB);  Surgeon: [name], MD;  Location: [place] CV LAB;  Service: Cardiovascular;  Laterality: Right;   CORONARY STENT INTERVENTION N/A 04/22/2018   Procedure: CORONARY STENT INTERVENTION;  Surgeon: [name], MD;  Location: [place] CV LAB;  Service: Cardiovascular;  Laterality: N/A;   DILATION AND CURETTAGE OF UTERUS     ESOPHAGEAL MANOMETRY N/A 03/03/2018   Procedure: ESOPHAGEAL MANOMETRY (EM);  Surgeon: Mauri Pole, MD;  [facility];  Service: Endoscopy;  Laterality: N/A;   GASTRIC FUNDOPLICATION     HERNIA  REPAIR     HYSTEROSCOPY     fibroids   LAPAROSCOPIC CHOLECYSTECTOMY     LAPAROSCOPY     fibroids   NISSEN FUNDOPLICATION  4034V   POLYPECTOMY     RIGHT/LEFT HEART CATH AND CORONARY ANGIOGRAPHY N/A 02/20/2018   Procedure: RIGHT/LEFT HEART CATH AND CORONARY ANGIOGRAPHY;  Surgeon: [name], MD;  Location: [place] CV LAB;  Service: Cardiovascular;  Laterality: N/A;   TEE WITHOUT CARDIOVERSION N/A 07/08/2018   Procedure: TRANSESOPHAGEAL ECHOCARDIOGRAM (TEE);  Surgeon: Burnell Blanks, MD;  Location: [place];  Service: Open Heart Surgery;  Laterality: N/A;   TEE WITHOUT CARDIOVERSION  10/07/2018   TEE  WITHOUT CARDIOVERSION N/A 10/07/2018   Procedure: TRANSESOPHAGEAL ECHOCARDIOGRAM (TEE);  Surgeon: Jerline Pain, MD;  Location: [facility] ENDOSCOPY;  Service: Cardiovascular;  Laterality: N/A;   TONSILLECTOMY     TRANSCATHETER AORTIC VALVE REPLACEMENT, TRANSFEMORAL N/A 07/08/2018   Procedure: TRANSCATHETER AORTIC VALVE REPLACEMENT, TRANSFEMORAL;  Surgeon: Burnell Blanks, MD;  Location: [place];  Service: Open Heart Surgery;  Laterality: N/A;     SOCIAL HISTORY: Social History   Socioeconomic History   Marital status: Married    Spouse name: Not on file   Number of children: 2   Years of education: Not on file   Highest education level: Not on file  Occupational History   Occupation: Tree surgeon of the [employer]  Tobacco Use   Smoking status: Never   Smokeless tobacco: Never  Vaping Use   Vaping Use: Never used  Substance and Sexual Activity   Alcohol use: No    Alcohol/week: 0.0 standard drinks   Drug use: Never   Sexual activity: Not Currently  Other Topics Concern   Not on file  Social History Narrative   Lives with husband in a one story home.     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   Education: college.   Social Determinants of Health   Financial Resource Strain: Low Risk    Difficulty of Paying Living Expenses: Not hard at all  Food Insecurity: No Food Insecurity   Worried About Charity fundraiser in the Last Year: Never true   [place] in the Last Year: Never true  Transportation Needs: No Transportation Needs   Lack of Transportation (Medical): No   Lack of Transportation (Non-Medical): No  Physical Activity: Inactive   Days of Exercise per Week: 0 days   Minutes of Exercise per Session: 0 min  Stress: No Stress Concern Present   Feeling of Stress : Not at all  Social Connections: Socially Integrated   Frequency of Communication with Friends and Family: More  than three times a week   Frequency of Social Gatherings with Friends and Family: More than three times a week   Attends Religious Services: More than 4 times per year   Active Member of [employer] or Organizations: Yes   Attends Music therapist: More than 4 times per year   Marital Status: Married  Human resources officer Violence: Not At Risk   Fear of Current or Ex-Partner: No   Emotionally Abused: No   Physically Abused: No   Sexually Abused: No     FAMILY HISTORY: Family History  Adopted: Yes  Problem Relation Age of Onset   Healthy Son        x 2   Headache Other        Cluster  headaches   Heart failure Mother    Colon cancer Neg Hx    Pancreatic cancer Neg Hx    Rectal cancer Neg Hx    Stomach cancer Neg Hx    ALLERGIES:   is allergic to statins.   MEDICATIONS:  Current Outpatient Medications  Medication Sig Dispense Refill   apixaban (ELIQUIS) 5 MG TABS tablet TAKE ONE TABLET BY MOUTH TWICE A DAY 60 tablet 2   b complex vitamins capsule Take 1 capsule by mouth daily.     BD PEN NEEDLE NANO 2ND GEN 32G X 4 MM MISC      Blood Glucose Monitoring Suppl (ONE TOUCH ULTRA 2) w/Device KIT Use to monitor blood glucose 1 kit 0   Blood Glucose Monitoring Suppl (ONETOUCH VERIO FLEX SYSTEM) w/Device KIT Use as directed 3 times daily 1 kit 0   carbidopa-levodopa (SINEMET) 25-250 MG tablet Take 1 tablet by mouth 3 (three) times daily. 90 tablet 5   cholecalciferol (VITAMIN D) 25 MCG (1000 UNIT) tablet Take 1,000 Units by mouth daily.     diltiazem (CARDIZEM CD) 120 MG 24 hr capsule Take 1 capsule (120 mg total) by mouth daily. 90 capsule 1   escitalopram (LEXAPRO) 20 MG tablet TAKE ONE TABLET BY MOUTH DAILY 90 tablet 1   estradiol (ESTRACE) 0.1 MG/GM vaginal cream Place vaginally.     Evolocumab (REPATHA SURECLICK) 888 MG/ML SOAJ Inject 1 dose into the skin every 14 (fourteen) days. 2 mL 11   glucose blood (ONETOUCH VERIO) test strip USE TO CHECK BLOOD SUGAR THREE TIMES DAILY  300 strip 4   glucose blood test strip Use to check blood sugar 3 times daily 300 each 3   Insulin Pen Needle (UNIFINE PENTIPS) 32G X 4 MM MISC Inject 4 times subcutaneously 400 each 2   Insulin Pen Needle 32G X 4 MM MISC Use to inject 4 times daily 400 each 2   Insulin Syringe-Needle U-100 (ULTICARE INSULIN SYRINGE) 31G X 5/16" 1 ML MISC FOUR TIMES A DAY 200 each 1   Insulin Syringe-Needle U-100 (ULTICARE INSULIN SYRINGE) 31G X 5/16" 1 ML MISC Inject 4 times daily subcutaneously 100 each 1   Insulin Syringe-Needle U-100 31G X 5/16" 1 ML MISC Use for injections 4 times daily 200 each 1   Lancets (ONETOUCH DELICA PLUS [id number]) MISC Use 1 lancet 2 times daily 200 each 1   Lancets (ONETOUCH DELICA PLUS [id number]) MISC Use 1 lancet 2 times daily 200 each 0   Lancets (ONETOUCH DELICA PLUS [id number]) MISC Use twice daily 200 each 1   levothyroxine (SYNTHROID) 125 MCG tablet Take 1 tablet by mouth every morning on an empty stomach 30 tablet 7   levothyroxine (SYNTHROID) 137 MCG tablet Take 137 mcg by mouth once.     levothyroxine (SYNTHROID) 137 MCG tablet Take 1 tablet by mouth every morning on an empty stomach 30 tablet 3   losartan (COZAAR) 50 MG tablet TAKE ONE TABLET BY MOUTH DAILY 90 tablet 1   metFORMIN (GLUCOPHAGE-XR) 500 MG 24 hr tablet Take 500 mg by mouth 2 (two) times daily.     metFORMIN (GLUCOPHAGE-XR) 500 MG 24 hr tablet Take 1 tablet by mouth 2 times daily 180 tablet 2   Multiple Vitamin (MULITIVITAMIN WITH MINERALS) TABS Take 1 tablet by mouth daily.     nitroGLYCERIN (NITROSTAT) 0.4 MG SL tablet Place 1 tablet (0.4 mg total) under the tongue every 5 (five) minutes as needed for chest pain. 25 tablet 3  NOVOLIN N 100 UNIT/ML injection Inject 28 Units into the skin daily before breakfast.     NOVOLIN R 100 UNIT/ML injection 8 Units 2 (two) times daily before a meal. 10 UNITS THIRTY MINUTES BEFORE MEALS.  5 ADDITIONAL UNITS WITH CARBS OR SNACKS. (04/26/2021:  Pt reports she only takes the  8 units 2x/daily before meals, not the 10 units)     omeprazole (PRILOSEC) 20 MG capsule Take 1 capsule (20 mg total) by mouth 2 (two) times daily before a meal. 60 capsule 3   OneTouch Delica Lancets 28U MISC Tests BS 2 times     No current facility-administered medications for this visit.     REVIEW OF SYSTEMS:   10 Point review of Systems was done is negative except as noted above.  PHYSICAL EXAMINATION: There were no vitals filed for this visit.  Wt Readings from Last 3 Encounters:  08/18/21 173 lb (78.5 kg)  08/14/21 176 lb (79.8 kg)  06/29/21 178 lb (80.7 kg)   There is no height or weight on file to calculate BMI.  NAD GENERAL:alert, in no acute distress and comfortable SKIN: no acute rashes, no significant lesions EYES: conjunctiva are pink and non-injected, sclera anicteric NECK: supple, no JVD LYMPH:  no palpable lymphadenopathy in the cervical, axillary or inguinal regions LUNGS: clear to auscultation b/l with normal respiratory effort HEART: regular rate & rhythm ABDOMEN:  normoactive bowel sounds , non tender, not distended. Extremity: no pedal edema PSYCH: alert & oriented x 3 with fluent speech NEURO: no focal motor/sensory deficits  Exam performed in chair.  LABORATORY DATA:  I have reviewed the data as listed     Latest Ref Rng & Units 09/06/2021   10:05 AM 04/05/2021    3:55 PM 03/13/2021   11:54 AM  CBC  WBC 4.0 - 10.5 K/uL 6.9   7.3   8.0    Hemoglobin 12.0 - 15.0 g/dL 11.0   10.0   10.5    Hematocrit 36.0 - 46.0 % 34.9   32.7   34.1    Platelets 150 - 400 K/uL 179   155   192.0     . CBC    Component Value Date/Time   WBC 6.9 09/06/2021 1005   WBC 7.3 04/05/2021 1555   RBC 5.39 (H) 09/06/2021 1005   HGB 11.0 (L) 09/06/2021 1005   HGB 11.1 08/03/2020 1100   HCT 34.9 (L) 09/06/2021 1005   HCT 34.5 08/03/2020 1100   PLT 179 09/06/2021 1005   PLT 201 08/03/2020 1100   MCV 64.7 (L) 09/06/2021 1005   MCV 65 (L) 08/03/2020 1100   MCH 20.4  (L) 09/06/2021 1005   MCHC 31.5 09/06/2021 1005   RDW 15.7 (H) 09/06/2021 1005   RDW 15.6 (H) 08/03/2020 1100   LYMPHSABS 1.1 09/06/2021 1005   LYMPHSABS 1.4 08/03/2020 1100   MONOABS 0.5 09/06/2021 1005   EOSABS 0.1 09/06/2021 1005   EOSABS 0.1 08/03/2020 1100   BASOSABS 0.0 09/06/2021 1005   BASOSABS 0.1 08/03/2020 1100   .    Latest Ref Rng & Units 09/06/2021   10:05 AM 04/05/2021    3:55 PM 03/13/2021   11:54 AM  CMP  Glucose 70 - 99 mg/dL 181   87   163    BUN 8 - 23 mg/dL 18   15   19     Creatinine 0.44 - 1.00 mg/dL 0.69   0.59   0.76    Sodium 135 - 145 mmol/L  139   138   138    Potassium 3.5 - 5.1 mmol/L 4.1   3.7   4.0    Chloride 98 - 111 mmol/L 104   105   103    CO2 22 - 32 mmol/L 26   27   26     Calcium 8.9 - 10.3 mg/dL 10.4   10.3   10.7    Total Protein 6.5 - 8.1 g/dL 7.1   6.6     Total Bilirubin 0.3 - 1.2 mg/dL 0.9   0.8     Alkaline Phos 38 - 126 U/L 96   52     AST 15 - 41 U/L 27   23     ALT 0 - 44 U/L 31   28      . Lab Results  Component Value Date   IRON 97 09/06/2021   TIBC 335 09/06/2021   IRONPCTSAT 29 09/06/2021   (Iron and TIBC)  Lab Results  Component Value Date   FERRITIN 86 09/06/2021     RADIOGRAPHIC STUDIES: I have personally reviewed the radiological images as listed and agreed with the findings in the report.  ASSESSMENT & PLAN:   Breanna Webster is a 76 y.o. female with:  Moderate Microcytic Anemia. H/o Beta thalassemia minor with baseline hgb likely in the 10-11 range 3.   Iron deficiency   PLAN: -Lab results today 09/07/2020 discussed in detail. CBC stable. Hgb at baseline @ 11 CMP unremarkable. Ferritin of 86 Iron sat ratio of 29%. No indication for IV Iron at this time -Continue to take Vitamin B-Complex daily. -Recommend pt continue f/u with Neurology for tremor management. -Will see back in 12 months with labs.   FOLLOW UP: RTC with Dr Irene Limbo with labs in 12 months  .The total time spent in the  appointment was 20 minutes* .  All of the patient's questions were answered with apparent satisfaction. The patient knows to call the clinic with any problems, questions or concerns.   Sullivan Lone MD MS AAHIVMS [facility] Hematology/Oncology Physician [facility]  .*Total Encounter Time as defined by the Centers for Medicare and Medicaid Services includes, in addition to the face-to-face time of a patient visit (documented in the note above) non-face-to-face time: obtaining and reviewing outside history, ordering and reviewing medications, tests or procedures, care coordination (communications with other health care professionals or caregivers) and documentation in the medical record.

## 2021-09-08 DIAGNOSIS — M170 Bilateral primary osteoarthritis of knee: Secondary | ICD-10-CM | POA: Diagnosis not present

## 2021-09-11 ENCOUNTER — Ambulatory Visit: Payer: Medicare Other | Admitting: Physical Therapy

## 2021-09-11 ENCOUNTER — Encounter: Payer: Self-pay | Admitting: Hematology

## 2021-09-11 DIAGNOSIS — R2689 Other abnormalities of gait and mobility: Secondary | ICD-10-CM

## 2021-09-11 DIAGNOSIS — R2681 Unsteadiness on feet: Secondary | ICD-10-CM

## 2021-09-11 DIAGNOSIS — M6281 Muscle weakness (generalized): Secondary | ICD-10-CM | POA: Diagnosis not present

## 2021-09-12 ENCOUNTER — Telehealth: Payer: Self-pay

## 2021-09-12 ENCOUNTER — Other Ambulatory Visit (HOSPITAL_BASED_OUTPATIENT_CLINIC_OR_DEPARTMENT_OTHER): Payer: Self-pay

## 2021-09-12 ENCOUNTER — Encounter: Payer: Self-pay | Admitting: Physical Therapy

## 2021-09-12 ENCOUNTER — Other Ambulatory Visit: Payer: Self-pay | Admitting: Adult Health

## 2021-09-12 ENCOUNTER — Ambulatory Visit (INDEPENDENT_AMBULATORY_CARE_PROVIDER_SITE_OTHER): Payer: Medicare Other

## 2021-09-12 DIAGNOSIS — Z8673 Personal history of transient ischemic attack (TIA), and cerebral infarction without residual deficits: Secondary | ICD-10-CM

## 2021-09-12 DIAGNOSIS — I2510 Atherosclerotic heart disease of native coronary artery without angina pectoris: Secondary | ICD-10-CM

## 2021-09-12 DIAGNOSIS — E039 Hypothyroidism, unspecified: Secondary | ICD-10-CM

## 2021-09-12 DIAGNOSIS — I10 Essential (primary) hypertension: Secondary | ICD-10-CM

## 2021-09-12 DIAGNOSIS — R350 Frequency of micturition: Secondary | ICD-10-CM

## 2021-09-12 DIAGNOSIS — E785 Hyperlipidemia, unspecified: Secondary | ICD-10-CM

## 2021-09-12 DIAGNOSIS — I480 Paroxysmal atrial fibrillation: Secondary | ICD-10-CM

## 2021-09-12 DIAGNOSIS — Z760 Encounter for issue of repeat prescription: Secondary | ICD-10-CM

## 2021-09-12 DIAGNOSIS — Z794 Long term (current) use of insulin: Secondary | ICD-10-CM

## 2021-09-12 MED ORDER — APIXABAN 5 MG PO TABS
5.0000 mg | ORAL_TABLET | Freq: Two times a day (BID) | ORAL | 3 refills | Status: DC
  Filled 2021-09-12: qty 60, 30d supply, fill #0
  Filled 2021-10-16: qty 60, 30d supply, fill #1
  Filled 2021-11-13: qty 60, 30d supply, fill #2
  Filled 2021-12-05 – 2021-12-07 (×2): qty 60, 30d supply, fill #3
  Filled 2021-12-20: qty 60, 30d supply, fill #4

## 2021-09-12 NOTE — Telephone Encounter (Signed)
Last appt for chronic medical conditions/labs 09/16/20.  Pt notified of above. Would like RN to call back to schedule when she is at home.

## 2021-09-12 NOTE — Patient Instructions (Signed)
Visit Information  Thank you for taking time to visit with me today. Please don't hesitate to contact me if I can be of assistance to you before our next scheduled telephone appointment.  Following are the goals we discussed today:  Take all medications as prescribed Attend all scheduled provider appointments Call pharmacy for medication refills 3-7 days in advance of running out of medications Call provider office for new concerns or questions  keep appointment with eye doctor check blood sugar at prescribed times: twice daily and when you have symptoms of low or high blood sugar fill half of plate with vegetables manage portion size keep feet up while sitting wash and dry feet carefully every day wear comfortable, cotton socks wear comfortable, well-fitting shoes check pulse (heart) rate once a day make a plan to eat healthy take medicine as prescribed check blood pressure daily choose a place to take my blood pressure (home, clinic or office, retail store) call doctor for signs and symptoms of high blood pressure keep all doctor appointments eat more whole grains, fruits and vegetables, lean meats and healthy fats call for medicine refill 2 or 3 days before it runs out take all medications exactly as prescribed call doctor with any symptoms you believe are related to your medicine  Our next appointment is by telephone on 10/17/21 at 10 $% AM  Please call the care guide team at [phone] if you need to cancel or reschedule your appointment.   If you are experiencing a Mental Health or [place] or need someone to talk to, please call the Suicide and Crisis Lifeline: 988 call the [place] National Suicide Prevention Lifeline: 432-244-5943 or TTY: 909-840-8028 TTY (773) 724-9312) to talk to a trained counselor call 1-800-273-TALK (toll free, 24 hour hotline) go to [facility] [address], [place] [phone]) call 911    Patient verbalizes understanding of instructions and care plan provided today and agrees to view in [facility]. Active MyChart status and patient understanding of how to access instructions and care plan via MyChart confirmed with patient.     Peter Garter RN, Jackquline Denmark, CDE Care Management Coordinator [facility] [phone]

## 2021-09-12 NOTE — Telephone Encounter (Signed)
Pt wants to know if she needs a preventative visit per PCP if she is having labs drawn by other providers & results are in the system. Will defer to PCP.

## 2021-09-12 NOTE — Chronic Care Management (AMB) (Signed)
Chronic Care Management   CCM RN Visit Note  09/12/2021 Name: Breanna Webster MRN: [id number] DOB: [birth year]  Subjective: Breanna Webster is a 76 y.o. year old female who is a primary care patient of [name], NP. The care management team was consulted for assistance with disease management and care coordination needs.    Engaged with patient by telephone for follow up visit in response to provider referral for case management and/or care coordination services.   Consent to Services:  The patient was given information about Chronic Care Management services, agreed to services, and gave verbal consent prior to initiation of services.  Please see initial visit note for detailed documentation.   Patient agreed to services and verbal consent obtained.   Assessment: Review of patient past medical history, allergies, medications, health status, including review of consultants reports, laboratory and other test data, was performed as part of comprehensive evaluation and provision of chronic care management services.   SDOH (Social Determinants of Health) assessments and interventions performed:    CCM Care Plan  Allergies  Allergen Reactions   Statins Other (See Comments)    Muscle aches    Outpatient Encounter Medications as of 09/12/2021  Medication Sig   apixaban (ELIQUIS) 5 MG TABS tablet TAKE ONE TABLET BY MOUTH TWICE A DAY   b complex vitamins capsule Take 1 capsule by mouth daily.   BD PEN NEEDLE NANO 2ND GEN 32G X 4 MM MISC    Blood Glucose Monitoring Suppl (ONE TOUCH ULTRA 2) w/Device KIT Use to monitor blood glucose   Blood Glucose Monitoring Suppl (ONETOUCH VERIO FLEX SYSTEM) w/Device KIT Use as directed 3 times daily   carbidopa-levodopa (SINEMET) 25-250 MG tablet Take 1 tablet by mouth 3 (three) times daily.   cholecalciferol (VITAMIN D) 25 MCG (1000 UNIT) tablet Take 1,000 Units by mouth daily.   diltiazem (CARDIZEM CD) 120 MG 24 hr capsule Take 1 capsule (120 mg  total) by mouth daily.   escitalopram (LEXAPRO) 20 MG tablet TAKE ONE TABLET BY MOUTH DAILY   estradiol (ESTRACE) 0.1 MG/GM vaginal cream Place vaginally.   Evolocumab (REPATHA SURECLICK) 333 MG/ML SOAJ Inject 1 dose into the skin every 14 (fourteen) days.   glucose blood (ONETOUCH VERIO) test strip USE TO CHECK BLOOD SUGAR THREE TIMES DAILY   glucose blood test strip Use to check blood sugar 3 times daily   Insulin Pen Needle (UNIFINE PENTIPS) 32G X 4 MM MISC Inject 4 times subcutaneously   Insulin Pen Needle 32G X 4 MM MISC Use to inject 4 times daily   Insulin Syringe-Needle U-100 (ULTICARE INSULIN SYRINGE) 31G X 5/16" 1 ML MISC FOUR TIMES A DAY   Insulin Syringe-Needle U-100 (ULTICARE INSULIN SYRINGE) 31G X 5/16" 1 ML MISC Inject 4 times daily subcutaneously   Insulin Syringe-Needle U-100 31G X 5/16" 1 ML MISC Use for injections 4 times daily   Lancets (ONETOUCH DELICA PLUS [id number]) MISC Use 1 lancet 2 times daily   Lancets (ONETOUCH DELICA PLUS [id number]) MISC Use 1 lancet 2 times daily   Lancets (ONETOUCH DELICA PLUS [id number]) MISC Use twice daily   levothyroxine (SYNTHROID) 125 MCG tablet Take 1 tablet by mouth every morning on an empty stomach   levothyroxine (SYNTHROID) 137 MCG tablet Take 137 mcg by mouth once.   levothyroxine (SYNTHROID) 137 MCG tablet Take 1 tablet by mouth every morning on an empty stomach   losartan (COZAAR) 50 MG tablet TAKE ONE TABLET BY MOUTH DAILY   metFORMIN (GLUCOPHAGE-XR)  500 MG 24 hr tablet Take 500 mg by mouth 2 (two) times daily.   metFORMIN (GLUCOPHAGE-XR) 500 MG 24 hr tablet Take 1 tablet by mouth 2 times daily   Multiple Vitamin (MULITIVITAMIN WITH MINERALS) TABS Take 1 tablet by mouth daily.   nitroGLYCERIN (NITROSTAT) 0.4 MG SL tablet Place 1 tablet (0.4 mg total) under the tongue every 5 (five) minutes as needed for chest pain.   NOVOLIN N 100 UNIT/ML injection Inject 28 Units into the skin daily before breakfast.   NOVOLIN R 100 UNIT/ML  injection 8 Units 2 (two) times daily before a meal. 10 UNITS THIRTY MINUTES BEFORE MEALS.  5 ADDITIONAL UNITS WITH CARBS OR SNACKS. (04/26/2021:  Pt reports she only takes the 8 units 2x/daily before meals, not the 10 units)   omeprazole (PRILOSEC) 20 MG capsule Take 1 capsule (20 mg total) by mouth 2 (two) times daily before a meal.   OneTouch Delica Lancets 82U MISC Tests BS 2 times   No facility-administered encounter medications on file as of 09/12/2021.    Patient Active Problem List   Diagnosis Date Noted   Left hemiparesis ([place]) 06/21/2021   Other specified hereditary hemolytic anemias ([place]) 06/21/2021   Parkinsonism ([place]) 06/21/2021   COVID-19 02/14/2021   Postviral fatigue syndrome 02/14/2021   Difficulty with adaptive servo-ventilation (ASV) use 02/14/2021   Sepsis secondary to UTI ([place]) 09/08/2020   Elevated ALT measurement 09/08/2020   History of CVA with residual deficit 09/08/2020   Hyperbilirubinemia 09/08/2020   Fatigue associated with anemia 08/03/2020   Cerebrovascular accident (CVA) due to embolism of right posterior cerebral artery ([place]) 08/03/2020   OSA treated with BiPAP 08/03/2020   Complex sleep apnea syndrome 08/03/2020   Treatment-emergent central sleep apnea 08/03/2020   Chronic intermittent hypoxia with obstructive sleep apnea 04/21/2020   OSA (obstructive sleep apnea) 04/21/2020   History of cardioembolic stroke 23/53/6144   Gait disturbance, post-stroke 03/29/2020   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 ([place]) 03/29/2020   Anxiety    RLQ abdominal pain 10/22/2019   Paroxysmal atrial fibrillation ([place]) 05/22/2019   Iron deficiency anemia 05/07/2019   Atrial fibrillation with RVR ([place]) 10/21/2018   Cerebellar stroke, acute ([place]) 10/21/2018   Streptococcal endocarditis    Endocarditis of mitral valve 10/07/2018   Bacteremia due to Streptococcus Salivarius 10/07/2018   Sepsis ([place]) 31/54/0086   Acute metabolic encephalopathy 76/19/5093    Severe aortic stenosis 07/08/2018   S/P TAVR (transcatheter aortic valve replacement) 07/08/2018   Esophageal thickening    CAD in native artery 04/22/2018   Gastroesophageal reflux disease    Pulmonary hypertension ([place]) 02/21/2018   Essential hypertension 07/15/2017   History of colonic polyps 05/22/2017   Elevated liver function tests 12/05/2016   DOE (dyspnea on exertion) 07/19/2016   Thalassemia minor 05/29/2016   Left bundle branch block 12/06/2015   Upper airway cough syndrome 26/71/2458   Diastolic heart failure ([place]) 10/10/2015   Myalgia 02/17/2014   Carotid artery stenosis 06/05/2013   Eustachian tube dysfunction 05/07/2013   Neuropathy of leg 03/07/2012   Anemia, unspecified 01/31/2012   Hot flashes 08/24/2010   Diabetes mellitus due to underlying condition, uncontrolled 06/30/2010   Generalized abdominal pain 03/24/2010   Obesity (BMI 30.0-34.9) 12/21/2009   IBS (irritable bowel syndrome) 04/29/2009   Vitamin D deficiency 03/10/2009   Acute bronchitis 03/07/2009   Hypothyroidism 12/13/2008   Dyslipidemia 12/13/2008   Anxiety state 12/13/2008   Other specified disorders of bladder 12/13/2008    Conditions to  be addressed/monitored:Atrial Fibrillation, CAD, HTN, HLD, DMII, and hx CVA  Care Plan : RN Care Manager Plan of Care  Updates made by [name], RN since 09/12/2021 12:00 AM     Problem: Chronic Disease Management and Care Coordination Needs (DM2, CAD, Afib,HTN. Hx CVA )   Priority: High     Long-Range Goal: Establish Plan of Care for Chronic Disease Management Needs (DM2, CAD, Afib,HTN. Hx CVA )   Start Date: 03/09/2021  Expected End Date: 07/20/2022  Recent Progress: On track  Priority: High  Note:   Current Barriers:  Care Coordination needs related to Financial constraints related to cost of medications Chronic Disease Management support and education needs related to Atrial Fibrillation, CAD, HTN, DMII, GERD, Overactive Bladder, and hx  CVA Pt states she is still going to water therapy and she states she feels better when she is in the water. States she is  walking  with a cane now.  States she saw Dr. Wynelle Link and she is going to get gel injections in her knees .  Denies any recent falls. States she is on a medication for Parkinson's but states her tremor in her hand has not improved. States her energy level is getting better.  States she does have to get up 1-2 times a night to urinate.  Denies any chest pains but does get winded with exertion.  States she saw Dr [name] and her A1C was 6.3%.  States Dr.[name] is leaving and she will need to find another endocrinologist.  States her CBGs had been AM ranges of 102-134 and PM 64-200.  States she has  lower reading  when she does not wake up from her nap soon enough.  States she has not been checking her B/P at home as she has checked at her doctors appointments RNCM Clinical Goal(s):  Patient will verbalize understanding of plan for management of Atrial Fibrillation, CAD, HTN, DMII, GERD, Overactive Bladder, and hx CVA as evidenced by voiced adherence to plan of care verbalize basic understanding of  Atrial Fibrillation, CAD, HTN, DMII, GERD, Overactive Bladder, and hx CVA disease process and self health management plan as evidenced by voiced understanding and teach back take all medications exactly as prescribed and will call provider for medication related questions as evidenced by dispense report and pt verbalization attend all scheduled medical appointments:  Neurology 10/04/21,Dexa scan 09/15/21, CCM PharmD 11/06/21, Annual Wellness Visit 04/05/22 as evidenced by medical records demonstrate Improved adherence to prescribed treatment plan for Atrial Fibrillation, CAD, HTN, DMII, GERD, Overactive Bladder, and hx CVA as evidenced by readings within limits, voiced adherence to plan of care continue to work with RN Care Manager to address care management and care coordination needs related  to  Atrial Fibrillation, CAD, HTN, DMII, GERD, Overactive Bladder, and hx CVA as evidenced by adherence to CM Team Scheduled appointments work with pharmacist to address Financial constraints related to cost of Myrbetriq and Eliquis related toAtrial Fibrillation, CAD, HTN, DMII, GERD, Overactive Bladder, and hx CVA as evidenced by review or EMR and patient or pharmacist report through collaboration with RN Care manager, provider, and care team.   Interventions: 1:1 collaboration with primary care provider regarding development and update of comprehensive plan of care as evidenced by provider attestation and co-signature Inter-disciplinary care team collaboration (see longitudinal plan of care) Evaluation of current treatment plan related to  self management and patient's adherence to plan as established by provider  Falls Interventions:  (Status:  Goal on track:  Yes.) Long Term Goal Reviewed medications and discussed potential side effects of medications such as dizziness and frequent urination Advised patient of importance of notifying provider of falls Assessed for signs and symptoms of orthostatic hypotension Assessed for falls since last encounter Assessed patients knowledge of fall risk prevention secondary to previously provided education Reinforced to continue to do exercises PT is teaching her to get stronger and going to water therapy.  Reinforced to use cane at all times     AFIB Interventions: (Status:  Condition stable.  Not addressed this visit.) Long Term Goal   Counseled on increased risk of stroke due to Afib and benefits of anticoagulation for stroke prevention Reviewed importance of adherence to anticoagulant exactly as prescribed Counseled on bleeding risk associated with Eliquis and importance of self-monitoring for signs/symptoms of bleeding Counseled on avoidance of NSAIDs due to increased bleeding risk with anticoagulants Counseled on seeking medical attention after a  head injury or if there is blood in the urine/stool Afib action plan reviewed   CAD Interventions: (Status:  Goal on track:  Yes.) Long Term Goal Assessed understanding of CAD diagnosis Medications reviewed including medications utilized in CAD treatment plan Provided education on importance of blood pressure control in management of CAD Provided education on Importance of limiting foods high in cholesterol Counseled on importance of regular laboratory monitoring as prescribed Reviewed Importance of taking all medications as prescribed Reviewed Importance of attending all scheduled provider appointments Advised to report any changes in symptoms or exercise tolerance   Diabetes Interventions:  (Status:  Goal on track:  Yes.) Long Term Goal Assessed patient's understanding of A1c goal: <7% Provided education to patient about basic DM disease process Reviewed medications with patient and discussed importance of medication adherence Counseled on importance of regular laboratory monitoring as prescribed Discussed plans with patient for ongoing care management follow up and provided patient with direct contact information for care management team Advised patient, providing education and rationale, to check cbg twice a day and record, calling provider for findings outside established parameters Reviewed with pt need to get new endocrinologist. Communicated with provider to refer pt to another endocrinologist.  Reinforced to try to eat lunch or small snack before taking her nap to help prevent hypoglycemia Lab Results  Component Value Date   HGBA1C 7.4 (H) 09/08/2020  6.3% 08/28/21 at endocrinology  Hyperlipidemia Interventions:  (Status:  Goal on track:  Yes.) Long Term Goal Medication review performed; medication list updated in electronic medical record.  Provider established cholesterol goals reviewed Counseled on importance of regular laboratory monitoring as prescribed Reviewed role and  benefits of statin for ASCVD risk reduction Reviewed importance of limiting foods high in cholesterol Reviewed exercise goals and target of 150 minutes per week Reviewed to continue Repatha   Hypertension Interventions:  (Status:  Goal on track:  Yes.) Long Term Goal Last practice recorded BP readings:  BP Readings from Last 3 Encounters:  09/06/21 120/65  08/18/21 120/60  08/14/21 132/74  Most recent eGFR/CrCl: No results found for: EGFR  No components found for: CRCL  Evaluation of current treatment plan related to hypertension self management and patient's adherence to plan as established by provider Provided education to patient re: stroke prevention, s/s of heart attack and stroke Reviewed medications with patient and discussed importance of compliance Counseled on the importance of exercise goals with target of 150 minutes per week Discussed plans with patient for ongoing care management follow up and provided patient with direct contact information for  care management team Advised patient, providing education and rationale, to monitor blood pressure daily and record, calling PCP for findings outside established parameters Provided education on prescribed diet low sodium low CHO heart healthy Discussed complications of poorly controlled blood pressure such as heart disease, stroke, circulatory complications, vision complications, kidney impairment, sexual dysfunction  Stroke:  (Status:Goal on track:  Yes.) Long Term Goal Reviewed Importance of taking all medications as prescribed Advised to report any changes in symptoms or exercise tolerance Assessed for management of bladder and/or bowel incontinence Reviewed to keep appointment with neurology. Reinforced to continue PT to help with balance and strength    Urinary frequency Goal on track:  Yes. Long Term Goal Evaluation of current treatment plan related to Overactive Bladder, Financial constraints related to cost of Myrbetric   self-management and patient's adherence to plan as established by provider. Discussed plans with patient for ongoing care management follow up and provided patient with direct contact information for care management team Evaluation of current treatment plan related to Urinary frequency and patient's adherence to plan as established by provider Discussed plans with patient for ongoing care management follow up and provided patient with direct contact information for care management team Reinforced to limit fluids later in the evening to help decrease night time trips to the bathroom. Reviewed to keep appointments  Dr. [name] and urology    Patient Goals/Self-Care Activities: Take all medications as prescribed Attend all scheduled provider appointments Call pharmacy for medication refills 3-7 days in advance of running out of medications Call provider office for new concerns or questions  keep appointment with eye doctor check blood sugar at prescribed times: twice daily and when you have symptoms of low or high blood sugar fill half of plate with vegetables manage portion size keep feet up while sitting wash and dry feet carefully every day wear comfortable, cotton socks wear comfortable, well-fitting shoes check pulse (heart) rate once a day make a plan to eat healthy take medicine as prescribed check blood pressure daily choose a place to take my blood pressure (home, clinic or office, retail store) call doctor for signs and symptoms of high blood pressure keep all doctor appointments eat more whole grains, fruits and vegetables, lean meats and healthy fats call for medicine refill 2 or 3 days before it runs out take all medications exactly as prescribed call doctor with any symptoms you believe are related to your medicine Follow Up Plan:  Telephone follow up appointment with care management team member scheduled for:  10/17/21 The patient has been provided with contact  information for the care management team and has been advised to call with any health related questions or concerns.         Plan:Telephone follow up appointment with care management team member scheduled for:  10/17/21 The patient has been provided with contact information for the care management team and has been advised to call with any health related questions or concerns.  Peter Garter RN, Jackquline [place], CDE Care Management Coordinator [facility] [phone]

## 2021-09-12 NOTE — Therapy (Signed)
[facility] [address] [place], [place], [address] Phone: [phone]   Fax:  [phone]  Physical Therapy Treatment  Patient Details  Name: Breanna Webster MRN: [id number] Date of Birth: [birth year] Referring Provider (PT): Dohmeier, Asencion Partridge   Encounter Date: 09/11/2021   PT End of Session - 09/12/21 1044     Visit Number 23    Number of Visits 23    Date for PT Re-Evaluation 09/20/21    Authorization Type Medicare/Generic Commercial    Authorization Time Period 08-07-21 - 09-20-21    Progress Note Due on Visit 20    PT Start Time 1400    PT Stop Time 9470    PT Time Calculation (min) 45 min    Equipment Utilized During Treatment Other (comment)   bar bells, pool noodle   Activity Tolerance Patient tolerated treatment well    Behavior During Therapy [facility] for tasks assessed/performed             Past Medical History:  Diagnosis Date   Anxiety    Arthritis    "back" (04/22/2018)   Back pain    Blood transfusion without reported diagnosis    CAD (coronary artery disease)    a. 03/2018 s/p PCI/DES to the RCA (3.0x15 Onyx DES).   Carotid artery stenosis    Mild   Chest pain    Chronic lower back pain    Cirrhosis (HCC)    Colon polyps    Diverticulitis    Diverticulosis    Esophageal thickening    seen on pre TAVR CT scan, also questionable cirrhosis. MRI recommended. Will refer to GI   Fatty liver    GERD (gastroesophageal reflux disease)    Grave's disease    History of colonic polyps 05/22/2017   History of hiatal hernia    Hypertension    Hypothyroidism    IBS (irritable bowel syndrome)    Osteopenia    Pulmonary nodules    seen on pre TAVR CT. likley benign. no follow up recommended if pt low risk.   S/P TAVR (transcatheter aortic valve replacement)    Severe aortic stenosis    Shortness of breath on exertion    Stroke ([place])    Thalassemia minor    Thyroid disease    Type II diabetes  mellitus ([place])     Past Surgical History:  Procedure Laterality Date   41 HOUR [place] STUDY N/A 03/03/2018   Procedure: 24 HOUR PH STUDY;  Surgeon: Mauri Pole, MD;  Location: [facility];  Service: Endoscopy;  Laterality: N/A;   AORTIC VALVE REPLACEMENT  06/2018   COLONOSCOPY     COLONOSCOPY W/ BIOPSIES AND POLYPECTOMY     CORONARY ANGIOGRAPHY Right 04/21/2018   Procedure: CORONARY ANGIOGRAPHY (CATH LAB);  Surgeon: [name], MD;  Location: [facility] CV LAB;  Service: Cardiovascular;  Laterality: Right;   CORONARY STENT INTERVENTION N/A 04/22/2018   Procedure: CORONARY STENT INTERVENTION;  Surgeon: [name], MD;  Location: [facility] CV LAB;  Service: Cardiovascular;  Laterality: N/A;   DILATION AND CURETTAGE OF UTERUS     ESOPHAGEAL MANOMETRY N/A 03/03/2018   Procedure: ESOPHAGEAL MANOMETRY (EM);  Surgeon: Mauri Pole, MD;  Location: [facility];  Service: Endoscopy;  Laterality: N/A;   GASTRIC FUNDOPLICATION     HERNIA REPAIR     HYSTEROSCOPY     fibroids   LAPAROSCOPIC CHOLECYSTECTOMY     LAPAROSCOPY     fibroids  NISSEN FUNDOPLICATION  6568L   POLYPECTOMY     RIGHT/LEFT HEART CATH AND CORONARY ANGIOGRAPHY N/A 02/20/2018   Procedure: RIGHT/LEFT HEART CATH AND CORONARY ANGIOGRAPHY;  Surgeon: [name], MD;  Location: [facility] CV LAB;  Service: Cardiovascular;  Laterality: N/A;   TEE WITHOUT CARDIOVERSION N/A 07/08/2018   Procedure: TRANSESOPHAGEAL ECHOCARDIOGRAM (TEE);  Surgeon: Burnell Blanks, MD;  Location: [facility];  Service: Open Heart Surgery;  Laterality: N/A;   TEE WITHOUT CARDIOVERSION  10/07/2018   TEE WITHOUT CARDIOVERSION N/A 10/07/2018   Procedure: TRANSESOPHAGEAL ECHOCARDIOGRAM (TEE);  Surgeon: Jerline Pain, MD;  Location: [facility] ENDOSCOPY;  Service: Cardiovascular;  Laterality: N/A;   TONSILLECTOMY     TRANSCATHETER AORTIC VALVE REPLACEMENT, TRANSFEMORAL N/A 07/08/2018   Procedure: TRANSCATHETER AORTIC VALVE REPLACEMENT,  TRANSFEMORAL;  Surgeon: Burnell Blanks, MD;  Location: [facility];  Service: Open Heart Surgery;  Laterality: N/A;    There were no vitals filed for this visit.   Subjective Assessment - 09/12/21 1042     Subjective Pt reports she will be getting injections in her Rt knee - states Dr. Elmyra Ricks said she was not ready for a full TKR but maybe a partial replacement - is going to get the gel injections to see if that helps    Pertinent History significant arthritis in knees    Patient Stated Goals Pt's goals for therapy are to get better with walking and with arthritis.    Currently in Pain? Yes    Pain Score 3     Pain Location Knee    Pain Orientation Right    Pain Descriptors / Indicators Aching;Discomfort;Dull    Pain Type Chronic pain    Pain Onset More than a month ago    Pain Frequency Intermittent                  Aquatic therapy at [facility] - pool temp 90 degrees  Patient seen for aquatic therapy today.  Treatment took place in water 3.5-4.5 feet deep depending upon activity.  Pt entered & exited the pool  the pool via step negotiation using step by step sequence for descension and ascension with use of hand rails with supervision  Runner's stretch each leg 30 sec hold x 1 rep for hamstring and heel cord stretching; gastroc stretching 30 sec hold x 1 rep each leg with toes on wall  Pt performed water walking 18' x 4 reps forwards across pool, using bar bells initially for approx. 4 reps and then without barbells to work on balance with gait; 18' x 4  reps with UE support with use of bar bells;  18' x 4 reps backwards across width of pool with UE support on bar bells  Sidestepping 18' x 2 reps across pool without barbells with CGA  Pt performed marching in place 10 reps with bil. UE support on pool edge prn;  marching forwards 18' x 2 reps with use of bar bells   Stepping strategy forward and back x 5 reps each; stepping laterally out/in 5 reps each  leg  Pt performed squats at edge of pool 10 reps  Pt performed water walking (forward) 18' x 2 reps with no UE support at end of session - 18' x 2 reps with cues to amb. With large step length and to increase arm swing  Pt requires buoyancy of water for support and safety with standing balance exercises; exercises are able to be safely attempted/performed in water without or  with minimal UE support that cannot be performed safely on land.  Viscosity of water is needed for resistance for strengthening.  Current of water provides perturbations for challenges with standing balance.  Buoyancy of water also provides joint offloading which increases comfort and reduces pain in knees with weight bearing activities and exs in water, whereas, this is unable to be achieved with weight bearing exercises on land.                           PT Short Term Goals - 09/12/21 1045       PT SHORT TERM GOAL #1   Title Pt will be independent with HEP for improved strength and flexibility, balance, transfers, and gait.   TARGET 05/26/2021    Time 4    Period Weeks    Status Achieved      PT SHORT TERM GOAL #2   Title Pt will improve Berg score to at least 41/56 to decrease fall risk.    Baseline 36/56    Time 4    Period Weeks    Status Achieved      PT SHORT TERM GOAL #3   Title Pt will improve TUG score to less than or equal to 21 sec for decreased fall risk.    Baseline 26.88 sec    Time 4    Period Weeks    Status Achieved      PT SHORT TERM GOAL #4   Title Pt will verbalize understanding of fall prevention in home environment.    Time 4    Period Weeks    Status Achieved               PT Long Term Goals - 09/12/21 1045       PT LONG TERM GOAL #1   Title Pt will be independent with HEP for improved flexibility and strength, balance, transfers, and gait.  TARGET 06/23/2021    Time 5    Period Weeks    Status Achieved    Target Date 07/18/21      PT LONG TERM GOAL  #2   Title Pt will improve Berg score to at least 46/56 to decrease fall risk.    Time 5    Period Weeks    Status Achieved   47/56   Target Date 07/18/21      PT LONG TERM GOAL #3   Title Pt will improve TUG score to less than or equal to 16 sec for decreased fall risk.    Baseline Not assessed - pt has been receiving aquatic therapy only for past 6 weeks    Time 5    Period Weeks    Status On-going   18.43 sec   Target Date 07/18/21      PT LONG TERM GOAL #4   Title Pt will improve gait velocity to at least 2 ft/sec for improved gait efficiency and safety.    Baseline 1.73 ft/sec    Time 9    Period Weeks    Status Achieved   2.69 ft/s     PT LONG TERM GOAL #5   Title Pt will verbalize plans for continued community fitness upon d/c to maximize gains made in PT.    Time 9    Period Weeks    Status Achieved   reports plans to join gym and walk at [place] #6  Title Pt will be independent in aquatic exercise program to be continued upon D/C from PT.    Time 4    Period Weeks    Status On-going    Target Date 08/28/21                   Plan - 09/12/21 1045     Clinical Impression Statement Pt has met LTG's #1, 2, 4-6;  LTG #3 deferred as TUG was not assessed at last land appt and pt has been receiving aquatic therapy only for past 6 weeks.  Pt has progressed with gait and balance; she is able to amb. without use of assistive device.  Pt reports significantly decreased Rt knee pain in water compared with that experienced on land.  Pt is planning on going on a cruise in August; pt requests to be placed on hold until mid July and then resume with 4 additional aquatic PT sessions to maximize and improve mobility prior to her trip.    Comorbidities CVA due to embolism R PCA; COVID -19, post-viral fatigue syndrome  PMH COVID 11/2020, arthritis, back pain, CAD, cirrhosis, diverticulitis/osis, GERD, HTN, IBS, osteopenia, PAF    PT Frequency 1x / week     PT Duration 4 weeks    PT Treatment/Interventions ADLs/Self Care Home Management;Aquatic Therapy;DME Instruction;Neuromuscular re-education;Balance training;Therapeutic exercise;Therapeutic activities;Functional mobility training;Gait training;Patient/family education;Manual techniques    PT Next Visit Plan plan on resuming aquatic therapy in July prior to her trip    Consulted and Agree with Plan of Care Patient             Patient will benefit from skilled therapeutic intervention in order to improve the following deficits and impairments:  Abnormal gait, Difficulty walking, Impaired flexibility, Decreased balance, Decreased mobility, Decreased strength, Postural dysfunction, Pain  Visit Diagnosis: Unsteadiness on feet  Other abnormalities of gait and mobility  Muscle weakness (generalized)     Problem List Patient Active Problem List   Diagnosis Date Noted   Left hemiparesis ([place]) 06/21/2021   Other specified hereditary hemolytic anemias ([place]) 06/21/2021   Parkinsonism ([place]) 06/21/2021   COVID-19 02/14/2021   Postviral fatigue syndrome 02/14/2021   Difficulty with adaptive servo-ventilation (ASV) use 02/14/2021   Sepsis secondary to UTI ([place]) 09/08/2020   Elevated ALT measurement 09/08/2020   History of CVA with residual deficit 09/08/2020   Hyperbilirubinemia 09/08/2020   Fatigue associated with anemia 08/03/2020   Cerebrovascular accident (CVA) due to embolism of right posterior cerebral artery ([place]) 08/03/2020   OSA treated with BiPAP 08/03/2020   Complex sleep apnea syndrome 08/03/2020   Treatment-emergent central sleep apnea 08/03/2020   Chronic intermittent hypoxia with obstructive sleep apnea 04/21/2020   OSA (obstructive sleep apnea) 04/21/2020   History of cardioembolic stroke 84/53/6468   Gait disturbance, post-stroke 03/29/2020   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 ([place]) 03/29/2020   Anxiety    RLQ abdominal pain 10/22/2019    Paroxysmal atrial fibrillation ([place]) 05/22/2019   Iron deficiency anemia 05/07/2019   Atrial fibrillation with RVR ([place]) 10/21/2018   Cerebellar stroke, acute ([place]) 10/21/2018   Streptococcal endocarditis    Endocarditis of mitral valve 10/07/2018   Bacteremia due to Streptococcus Salivarius 10/07/2018   Sepsis ([place]) 07/12/2246   Acute metabolic encephalopathy 25/00/3704   Severe aortic stenosis 07/08/2018   S/P TAVR (transcatheter aortic valve replacement) 07/08/2018   Esophageal thickening    CAD in native artery 04/22/2018   Gastroesophageal reflux disease    Pulmonary hypertension ([place])  02/21/2018   Essential hypertension 07/15/2017   History of colonic polyps 05/22/2017   Elevated liver function tests 12/05/2016   DOE (dyspnea on exertion) 07/19/2016   Thalassemia minor 05/29/2016   Left bundle branch block 12/06/2015   Upper airway cough syndrome 76/80/8811   Diastolic heart failure ([place]) 10/10/2015   Myalgia 02/17/2014   Carotid artery stenosis 06/05/2013   Eustachian tube dysfunction 05/07/2013   Neuropathy of leg 03/07/2012   Anemia, unspecified 01/31/2012   Hot flashes 08/24/2010   Diabetes mellitus due to underlying condition, uncontrolled 06/30/2010   Generalized abdominal pain 03/24/2010   Obesity (BMI 30.0-34.9) 12/21/2009   IBS (irritable bowel syndrome) 04/29/2009   Vitamin D deficiency 03/10/2009   Acute bronchitis 03/07/2009   Hypothyroidism 12/13/2008   Dyslipidemia 12/13/2008   Anxiety state 12/13/2008   Other specified disorders of bladder 12/13/2008    SRPRXY, VOPFY [id number], PT 09/12/2021, 2:07 PM  [facility] [address] [place], [place], [address] Phone: [phone]   Fax:  [phone]  Name: KIPPY MELENA MRN: [id number] Date of Birth: October 17, 1945

## 2021-09-13 ENCOUNTER — Other Ambulatory Visit (HOSPITAL_BASED_OUTPATIENT_CLINIC_OR_DEPARTMENT_OTHER): Payer: Self-pay

## 2021-09-14 NOTE — Telephone Encounter (Signed)
Pt notified of PCP response. Appt scheduled.

## 2021-09-15 ENCOUNTER — Ambulatory Visit
Admission: RE | Admit: 2021-09-15 | Discharge: 2021-09-15 | Disposition: A | Discharge status: Home / Self Care | Payer: Medicare Other | Source: Ambulatory Visit | Attending: Obstetrics | Admitting: Obstetrics

## 2021-09-15 DIAGNOSIS — Z780 Asymptomatic menopausal state: Secondary | ICD-10-CM | POA: Diagnosis not present

## 2021-09-15 DIAGNOSIS — M8580 Other specified disorders of bone density and structure, unspecified site: Secondary | ICD-10-CM

## 2021-09-15 DIAGNOSIS — M8589 Other specified disorders of bone density and structure, multiple sites: Secondary | ICD-10-CM | POA: Diagnosis not present

## 2021-09-20 ENCOUNTER — Encounter: Payer: Self-pay | Admitting: Hematology

## 2021-09-20 ENCOUNTER — Other Ambulatory Visit (HOSPITAL_BASED_OUTPATIENT_CLINIC_OR_DEPARTMENT_OTHER): Payer: Self-pay

## 2021-09-20 ENCOUNTER — Other Ambulatory Visit: Payer: Self-pay | Admitting: Physician Assistant

## 2021-09-20 DIAGNOSIS — I4891 Unspecified atrial fibrillation: Secondary | ICD-10-CM | POA: Diagnosis not present

## 2021-09-20 DIAGNOSIS — I10 Essential (primary) hypertension: Secondary | ICD-10-CM | POA: Diagnosis not present

## 2021-09-20 DIAGNOSIS — E1169 Type 2 diabetes mellitus with other specified complication: Secondary | ICD-10-CM | POA: Diagnosis not present

## 2021-09-20 DIAGNOSIS — E785 Hyperlipidemia, unspecified: Secondary | ICD-10-CM

## 2021-09-20 DIAGNOSIS — I2510 Atherosclerotic heart disease of native coronary artery without angina pectoris: Secondary | ICD-10-CM | POA: Diagnosis not present

## 2021-09-20 DIAGNOSIS — H04123 Dry eye syndrome of bilateral lacrimal glands: Secondary | ICD-10-CM | POA: Diagnosis not present

## 2021-09-20 DIAGNOSIS — H34212 Partial retinal artery occlusion, left eye: Secondary | ICD-10-CM | POA: Diagnosis not present

## 2021-09-20 DIAGNOSIS — H2513 Age-related nuclear cataract, bilateral: Secondary | ICD-10-CM | POA: Diagnosis not present

## 2021-09-20 DIAGNOSIS — Z794 Long term (current) use of insulin: Secondary | ICD-10-CM | POA: Diagnosis not present

## 2021-09-20 MED ORDER — OMEPRAZOLE 20 MG PO CPDR
20.0000 mg | DELAYED_RELEASE_CAPSULE | Freq: Two times a day (BID) | ORAL | 3 refills | Status: DC
  Filled 2021-09-20: qty 180, 90d supply, fill #0
  Filled 2022-02-09: qty 60, 30d supply, fill #1

## 2021-09-20 MED ORDER — LEVOTHYROXINE SODIUM 125 MCG PO TABS
ORAL_TABLET | ORAL | 0 refills | Status: DC
  Filled 2021-09-20: qty 90, 90d supply, fill #0

## 2021-09-21 ENCOUNTER — Ambulatory Visit: Payer: Medicare Other | Admitting: Neurology

## 2021-09-22 ENCOUNTER — Other Ambulatory Visit: Payer: Self-pay | Admitting: Gastroenterology

## 2021-09-22 ENCOUNTER — Other Ambulatory Visit (HOSPITAL_BASED_OUTPATIENT_CLINIC_OR_DEPARTMENT_OTHER): Payer: Self-pay

## 2021-09-22 ENCOUNTER — Ambulatory Visit: Payer: Medicare Other | Admitting: Gastroenterology

## 2021-09-22 ENCOUNTER — Other Ambulatory Visit: Payer: Self-pay | Admitting: Adult Health

## 2021-09-22 ENCOUNTER — Other Ambulatory Visit (INDEPENDENT_AMBULATORY_CARE_PROVIDER_SITE_OTHER): Payer: Medicare Other

## 2021-09-22 DIAGNOSIS — K7460 Unspecified cirrhosis of liver: Secondary | ICD-10-CM | POA: Diagnosis not present

## 2021-09-22 LAB — CBC WITH DIFFERENTIAL/PLATELET
Basophils Absolute: 0 10*3/uL (ref 0.0–0.1)
Basophils Relative: 0.7 % (ref 0.0–3.0)
Eosinophils Absolute: 0.1 10*3/uL (ref 0.0–0.7)
Eosinophils Relative: 1.8 % (ref 0.0–5.0)
HCT: 34.9 % — ABNORMAL LOW (ref 36.0–46.0)
Hemoglobin: 10.9 g/dL — ABNORMAL LOW (ref 12.0–15.0)
Lymphocytes Relative: 16.8 % (ref 12.0–46.0)
Lymphs Abs: 1.1 10*3/uL (ref 0.7–4.0)
MCHC: 31.2 g/dL (ref 30.0–36.0)
MCV: 64.2 fl — ABNORMAL LOW (ref 78.0–100.0)
Monocytes Absolute: 0.5 10*3/uL (ref 0.1–1.0)
Monocytes Relative: 6.9 % (ref 3.0–12.0)
Neutro Abs: 5 10*3/uL (ref 1.4–7.7)
Neutrophils Relative %: 73.8 % (ref 43.0–77.0)
Platelets: 173 10*3/uL (ref 150.0–400.0)
RBC: 5.44 Mil/uL — ABNORMAL HIGH (ref 3.87–5.11)
RDW: 15 % (ref 11.5–15.5)
WBC: 6.8 10*3/uL (ref 4.0–10.5)

## 2021-09-22 LAB — COMPREHENSIVE METABOLIC PANEL
ALT: 14 U/L (ref 0–35)
AST: 28 U/L (ref 0–37)
Albumin: 4.1 g/dL (ref 3.5–5.2)
Alkaline Phosphatase: 93 U/L (ref 39–117)
BUN: 15 mg/dL (ref 6–23)
CO2: 28 mEq/L (ref 19–32)
Calcium: 10.5 mg/dL (ref 8.4–10.5)
Chloride: 101 mEq/L (ref 96–112)
Creatinine, Ser: 0.73 mg/dL (ref 0.40–1.20)
GFR: 79.08 mL/min (ref >60.00)
Glucose, Bld: 187 mg/dL — ABNORMAL HIGH (ref 70–99)
Potassium: 4 mEq/L (ref 3.5–5.1)
Sodium: 136 mEq/L (ref 135–145)
Total Bilirubin: 1 mg/dL (ref 0.2–1.2)
Total Protein: 7.2 g/dL (ref 6.0–8.3)

## 2021-09-25 ENCOUNTER — Other Ambulatory Visit (HOSPITAL_BASED_OUTPATIENT_CLINIC_OR_DEPARTMENT_OTHER): Payer: Self-pay

## 2021-09-25 DIAGNOSIS — M1711 Unilateral primary osteoarthritis, right knee: Secondary | ICD-10-CM | POA: Diagnosis not present

## 2021-09-25 LAB — AFP TUMOR MARKER: AFP-Tumor Marker: 2.6 ng/mL

## 2021-09-25 MED ORDER — METFORMIN HCL ER 500 MG PO TB24
500.0000 mg | ORAL_TABLET | Freq: Two times a day (BID) | ORAL | 2 refills | Status: DC
  Filled 2021-09-25: qty 180, 90d supply, fill #0
  Filled 2022-02-09: qty 180, 90d supply, fill #1
  Filled 2022-06-07: qty 180, 90d supply, fill #2

## 2021-10-02 ENCOUNTER — Other Ambulatory Visit (HOSPITAL_BASED_OUTPATIENT_CLINIC_OR_DEPARTMENT_OTHER): Payer: Self-pay

## 2021-10-02 MED ORDER — GEMTESA 75 MG PO TABS
ORAL_TABLET | ORAL | 0 refills | Status: DC
  Filled 2021-10-02 (×2): qty 30, 30d supply, fill #0

## 2021-10-03 ENCOUNTER — Other Ambulatory Visit (HOSPITAL_BASED_OUTPATIENT_CLINIC_OR_DEPARTMENT_OTHER): Payer: Self-pay

## 2021-10-04 ENCOUNTER — Ambulatory Visit: Payer: Medicare Other | Admitting: Neurology

## 2021-10-04 ENCOUNTER — Telehealth: Payer: Self-pay | Admitting: Adult Health

## 2021-10-04 DIAGNOSIS — M1711 Unilateral primary osteoarthritis, right knee: Secondary | ICD-10-CM | POA: Diagnosis not present

## 2021-10-04 NOTE — Telephone Encounter (Signed)
Contacted pt back, LVM rq a call back. Jessica's plan 08/14/21 was for pt to Continue current regimen - will have Dr. Brett Fairy follow up with you regarding test results and further treatment options

## 2021-10-04 NOTE — Telephone Encounter (Signed)
Patient was in the lobby for an appt with Dr. Brett Fairy but needed to be r/s. She wanted Janett Billow to call her today so she could tell her the [place] [facility] isn't working and she doesn't think she could stop it abruptly. Please call her on her cell to advise. thanks

## 2021-10-04 NOTE — Telephone Encounter (Signed)
Would recommend she continue to use for now until she is seen by Dr. Brett Fairy on 7/5 to further discuss, dosage may need to be increased and would be better to be increased while taking current dosage then starting all over again. Thank you.

## 2021-10-12 ENCOUNTER — Other Ambulatory Visit (HOSPITAL_BASED_OUTPATIENT_CLINIC_OR_DEPARTMENT_OTHER): Payer: Self-pay

## 2021-10-12 ENCOUNTER — Encounter: Payer: Self-pay | Admitting: Adult Health

## 2021-10-12 ENCOUNTER — Ambulatory Visit (INDEPENDENT_AMBULATORY_CARE_PROVIDER_SITE_OTHER): Payer: Medicare Other | Admitting: Adult Health

## 2021-10-12 VITALS — BP 120/68 | HR 80 | Temp 98.1°F | Ht 65.0 in | Wt 179.0 lb

## 2021-10-12 DIAGNOSIS — R600 Localized edema: Secondary | ICD-10-CM

## 2021-10-12 DIAGNOSIS — I10 Essential (primary) hypertension: Secondary | ICD-10-CM | POA: Diagnosis not present

## 2021-10-12 DIAGNOSIS — I2510 Atherosclerotic heart disease of native coronary artery without angina pectoris: Secondary | ICD-10-CM

## 2021-10-12 DIAGNOSIS — Z794 Long term (current) use of insulin: Secondary | ICD-10-CM

## 2021-10-12 DIAGNOSIS — E785 Hyperlipidemia, unspecified: Secondary | ICD-10-CM

## 2021-10-12 DIAGNOSIS — R2681 Unsteadiness on feet: Secondary | ICD-10-CM | POA: Diagnosis not present

## 2021-10-12 DIAGNOSIS — E1169 Type 2 diabetes mellitus with other specified complication: Secondary | ICD-10-CM | POA: Diagnosis not present

## 2021-10-12 DIAGNOSIS — F419 Anxiety disorder, unspecified: Secondary | ICD-10-CM | POA: Diagnosis not present

## 2021-10-12 DIAGNOSIS — G20 Parkinson's disease: Secondary | ICD-10-CM | POA: Diagnosis not present

## 2021-10-12 DIAGNOSIS — Z760 Encounter for issue of repeat prescription: Secondary | ICD-10-CM

## 2021-10-12 DIAGNOSIS — I480 Paroxysmal atrial fibrillation: Secondary | ICD-10-CM | POA: Diagnosis not present

## 2021-10-12 DIAGNOSIS — E039 Hypothyroidism, unspecified: Secondary | ICD-10-CM

## 2021-10-12 DIAGNOSIS — Z8673 Personal history of transient ischemic attack (TIA), and cerebral infarction without residual deficits: Secondary | ICD-10-CM | POA: Diagnosis not present

## 2021-10-12 DIAGNOSIS — Z952 Presence of prosthetic heart valve: Secondary | ICD-10-CM

## 2021-10-12 DIAGNOSIS — I5032 Chronic diastolic (congestive) heart failure: Secondary | ICD-10-CM

## 2021-10-12 MED ORDER — ESCITALOPRAM OXALATE 20 MG PO TABS
ORAL_TABLET | ORAL | 1 refills | Status: DC
  Filled 2021-10-12: qty 90, fill #0
  Filled 2021-12-19: qty 90, 90d supply, fill #0
  Filled 2022-03-12: qty 90, 90d supply, fill #1

## 2021-10-12 MED ORDER — DILTIAZEM HCL ER COATED BEADS 120 MG PO CP24
120.0000 mg | ORAL_CAPSULE | Freq: Every day | ORAL | 1 refills | Status: DC
  Filled 2021-10-12 – 2021-12-19 (×2): qty 90, 90d supply, fill #0

## 2021-10-12 MED ORDER — FUROSEMIDE 40 MG PO TABS
40.0000 mg | ORAL_TABLET | Freq: Every day | ORAL | 0 refills | Status: DC
  Filled 2021-10-12: qty 10, 10d supply, fill #0

## 2021-10-12 NOTE — Patient Instructions (Signed)
Health Maintenance Due  Topic Date Due   FOOT EXAM  04/08/2019   HEMOGLOBIN A1C  03/11/2021   OPHTHALMOLOGY EXAM  08/29/2021      Row Labels 04/03/2021    1:29 PM 10/03/2020    1:18 PM 03/14/2020   11:39 AM  Depression screen PHQ 2/9   Section Header. No data exists in this row.     Decreased Interest   0 0 0  Down, Depressed, Hopeless   0 0 0  PHQ - 2 Score   0 0 0  Altered sleeping     0  Tired, decreased energy     0  Change in appetite     0  Feeling bad or failure about yourself      0  Trouble concentrating     0  Moving slowly or fidgety/restless     0  Suicidal thoughts     0  PHQ-9 Score     0  Difficult doing work/chores     Not difficult at all

## 2021-10-12 NOTE — Progress Notes (Signed)
Complete physical exam  Patient: Breanna Webster   DOB: 1945-09-16   76 y.o. Female  MRN: [id number]  Subjective:    Chief Complaint  Patient presents with   Annual Exam    Breanna Webster is a 76 y.o. female who presents today for a complete yearly follow up exam  She reports consuming a general diet. Exercise is limited by cardiac condition(s):  , orthopedic condition(s):  , respiratory condition(s):  Breanna Webster She generally feels fairly well. She reports sleeping fairly well. She does have additional problems to discuss today.   She has an extensive medical history that includes the following.   DM Type 2 - is seen by Endocrinology but she is looking to switch endocrinologists ( referral has been placed).  She is currently managed with metformin extended release 500 mg twice daily, Novolin 28 units in the morning and  insulin R 8 units 3 times daily before meals  Lab Results  Component Value Date   HGBA1C 7.4 (H) 09/08/2020   Hypothyroidism - managed by endocrinology with synthroid 125 mcg daily.  Lab Results  Component Value Date   TSH 1.44 12/05/2020   Hypertension - managed with losartan 50 mg daily and cardizem 120 mg daily.   History of beta thalassemia minor/moderate microcytic anemia/iron deficiency-is managed by hematology.  She was last seen on 09/06/2021 which showed no indication for iron infusion at that time.  It is recommended that she continue to take vitamin B complex daily.  Follow-up in 12 months or sooner if needed.  Paroxysmal Atrial Fibrillation -managed by cardiology.  She is on Eliquis.  Denies issues with bleeding.  Hyperlipidemia- tried statins in the past and myalgia in her mood.  Currently managed by lipid clinic with [facility]. Lab Results  Component Value Date   CHOL 106 09/06/2021   HDL 50 09/06/2021   LDLCALC 36 09/06/2021   LDLDIRECT 98.0 05/24/2014   TRIG 101 09/06/2021   CHOLHDL 2.1 09/06/2021   CAD -status post PCI/DES x1 to mRCA on RCA in  December 2019.  Currently prescribed Repatha but lowering therapy and Eliquis.  She does continue to have intermittent episodes of chest discomfort that lasts less than 5 minutes.  Episodes occur at rest.  Chronic Diastolic CHF - not currently taking any diauretic therapy as she has not needed any. Reports that over the last few days she has been experiencing bilateral lower extremity edema. Has not had any SOB and CP past baseline. She has not noticed any redness, warmth or swelling to her calf muscles.   GERD - managed with omprazole 20 mg daily. This controls her symptoms well.   NASH Cirrhosis -managed by gastroenterology-suspect cirrhosis but her imaging has been conflicting depending on the study that she has had.  GI believes that she has some mild compensated cirrhosis from fatty liver disease.  History of multiple stokes including cerebellar and pontine stroke  does have residual deficits including mild left upper extremity decreased dexterity with intermittent tremor and cognitive impairment.  She is on Eliquis and was started on Repatha for secondary stroke prevention.  Tremor of left hand/Parkinsoniasm -is followed by neurology, Dr. Brett Fairy who suspects parkinsonian, DaTscan on 08/10/2021 showed mild decreased activity of the posterior putamen and head to the right caudate nucleus this was felt to account for left-sided tremor and suspicion of parkinsonian is on pathology.  She is currently trialing Sinemet and has not noticed any improvement.  She has a follow-up appointment with neurology on  July 5  Depression - managed with Celexa 20 mg daily.   Most recent fall risk assessment:    04/03/2021    1:34 PM  Fall Risk   Falls in the past year? 1  Comment Patient lost balance in mud. No injury  Number falls in past yr: 0  Injury with Fall? 0     Most recent depression screenings:    04/03/2021    1:29 PM 10/03/2020    1:18 PM  PHQ 2/9 Scores  PHQ - 2 Score 0 0       Patient Active Problem List   Diagnosis Date Noted   Left hemiparesis ([place]) 06/21/2021   Other specified hereditary hemolytic anemias ([place]) 06/21/2021   Parkinsonism ([place]) 06/21/2021   COVID-19 02/14/2021   Postviral fatigue syndrome 02/14/2021   Difficulty with adaptive servo-ventilation (ASV) use 02/14/2021   Sepsis secondary to UTI ([place]) 09/08/2020   Elevated ALT measurement 09/08/2020   History of CVA with residual deficit 09/08/2020   Hyperbilirubinemia 09/08/2020   Fatigue associated with anemia 08/03/2020   Cerebrovascular accident (CVA) due to embolism of right posterior cerebral artery ([place]) 08/03/2020   OSA treated with BiPAP 08/03/2020   Complex sleep apnea syndrome 08/03/2020   Treatment-emergent central sleep apnea 08/03/2020   Chronic intermittent hypoxia with obstructive sleep apnea 04/21/2020   OSA (obstructive sleep apnea) 04/21/2020   History of cardioembolic stroke 83/38/2505   Gait disturbance, post-stroke 03/29/2020   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 ([place]) 03/29/2020   Anxiety    RLQ abdominal pain 10/22/2019   Paroxysmal atrial fibrillation ([place]) 05/22/2019   Iron deficiency anemia 05/07/2019   Atrial fibrillation with RVR ([place]) 10/21/2018   Cerebellar stroke, acute ([place]) 10/21/2018   Streptococcal endocarditis    Endocarditis of mitral valve 10/07/2018   Bacteremia due to Streptococcus Salivarius 10/07/2018   Sepsis ([place]) 39/76/7341   Acute metabolic encephalopathy 93/79/0240   Severe aortic stenosis 07/08/2018   S/P TAVR (transcatheter aortic valve replacement) 07/08/2018   Esophageal thickening    CAD in native artery 04/22/2018   Gastroesophageal reflux disease    Pulmonary hypertension ([place]) 02/21/2018   Essential hypertension 07/15/2017   History of colonic polyps 05/22/2017   Elevated liver function tests 12/05/2016   DOE (dyspnea on exertion) 07/19/2016   Thalassemia minor 05/29/2016   Left bundle branch block  12/06/2015   Upper airway cough syndrome 97/35/3299   Diastolic heart failure ([place]) 10/10/2015   Myalgia 02/17/2014   Carotid artery stenosis 06/05/2013   Eustachian tube dysfunction 05/07/2013   Neuropathy of leg 03/07/2012   Anemia, unspecified 01/31/2012   Hot flashes 08/24/2010   Diabetes mellitus due to underlying condition, uncontrolled 06/30/2010   Generalized abdominal pain 03/24/2010   Obesity (BMI 30.0-34.9) 12/21/2009   IBS (irritable bowel syndrome) 04/29/2009   Vitamin D deficiency 03/10/2009   Acute bronchitis 03/07/2009   Hypothyroidism 12/13/2008   Dyslipidemia 12/13/2008   Anxiety state 12/13/2008   Other specified disorders of bladder 12/13/2008      Patient Care Team: [name], NP as PCP - General (Family Medicine) [name], MD as PCP - Cardiology (Cardiology) Madelin Rear, MD as Consulting Physician (Endocrinology) [name], RN as Case Manager Garvin Fila, MD as Referring Physician (Neurology) Viona Gilmore, [facility] as Pharmacist (Pharmacist)   Outpatient Medications Prior to Visit  Medication Sig   apixaban (ELIQUIS) 5 MG TABS tablet Take 1 tablet (5 mg total) by mouth 2 (two) times daily.   b  complex vitamins capsule Take 1 capsule by mouth daily.   BD PEN NEEDLE NANO 2ND GEN 32G X 4 MM MISC    carbidopa-levodopa (SINEMET) 25-250 MG tablet Take 1 tablet by mouth 3 (three) times daily.   cholecalciferol (VITAMIN D) 25 MCG (1000 UNIT) tablet Take 1,000 Units by mouth daily.   diltiazem (CARDIZEM CD) 120 MG 24 hr capsule Take 1 capsule (120 mg total) by mouth daily.   escitalopram (LEXAPRO) 20 MG tablet TAKE ONE TABLET BY MOUTH DAILY   estradiol (ESTRACE) 0.1 MG/GM vaginal cream Place vaginally.   Evolocumab (REPATHA SURECLICK) 465 MG/ML SOAJ Inject 1 dose into the skin every 14 (fourteen) days.   glucose blood (ONETOUCH VERIO) test strip USE TO CHECK BLOOD SUGAR THREE TIMES DAILY   Insulin Pen Needle (UNIFINE PENTIPS) 32G X  4 MM MISC Inject 4 times subcutaneously   Insulin Syringe-Needle U-100 (ULTICARE INSULIN SYRINGE) 31G X 5/16" 1 ML MISC Inject 4 times daily subcutaneously   Lancets (ONETOUCH DELICA PLUS [id number]) MISC Use twice daily   levothyroxine (SYNTHROID) 125 MCG tablet Take 1 tablet by mouth in the morning on an empty stomach once a day. 90 days   losartan (COZAAR) 50 MG tablet TAKE ONE TABLET BY MOUTH DAILY   metFORMIN (GLUCOPHAGE-XR) 500 MG 24 hr tablet Take 1 tablet by mouth 2 times daily   Multiple Vitamin (MULITIVITAMIN WITH MINERALS) TABS Take 1 tablet by mouth daily.   nitroGLYCERIN (NITROSTAT) 0.4 MG SL tablet Place 1 tablet (0.4 mg total) under the tongue every 5 (five) minutes as needed for chest pain.   NOVOLIN N 100 UNIT/ML injection Inject 28 Units into the skin daily before breakfast.   NOVOLIN R 100 UNIT/ML injection 8 Units 2 (two) times daily before a meal. 10 UNITS THIRTY MINUTES BEFORE MEALS.  5 ADDITIONAL UNITS WITH CARBS OR SNACKS. (04/26/2021:  Pt reports she only takes the 8 units 2x/daily before meals, not the 10 units)   omeprazole (PRILOSEC) 20 MG capsule Take 1 capsule (20 mg total) by mouth 2 (two) times daily before a meal.   Vibegron (GEMTESA) 75 MG TABS 1 tablet PO QD   [DISCONTINUED] Blood Glucose Monitoring Suppl (ONE TOUCH ULTRA 2) w/Device KIT Use to monitor blood glucose   [DISCONTINUED] Blood Glucose Monitoring Suppl (ONETOUCH VERIO FLEX SYSTEM) w/Device KIT Use as directed 3 times daily   [DISCONTINUED] glucose blood test strip Use to check blood sugar 3 times daily   [DISCONTINUED] Insulin Pen Needle 32G X 4 MM MISC Use to inject 4 times daily   [DISCONTINUED] Insulin Syringe-Needle U-100 (ULTICARE INSULIN SYRINGE) 31G X 5/16" 1 ML MISC FOUR TIMES A DAY   [DISCONTINUED] Insulin Syringe-Needle U-100 31G X 5/16" 1 ML MISC Use for injections 4 times daily   [DISCONTINUED] Lancets (ONETOUCH DELICA PLUS [id number]) MISC Use 1 lancet 2 times daily   [DISCONTINUED] Lancets  (ONETOUCH DELICA PLUS [id number]) MISC Use 1 lancet 2 times daily   [DISCONTINUED] levothyroxine (SYNTHROID) 125 MCG tablet Take 1 tablet by mouth every morning on an empty stomach   [DISCONTINUED] levothyroxine (SYNTHROID) 137 MCG tablet Take 137 mcg by mouth once.   [DISCONTINUED] metFORMIN (GLUCOPHAGE-XR) 500 MG 24 hr tablet Take 500 mg by mouth 2 (two) times daily.   [DISCONTINUED] OneTouch Delica Lancets 65L MISC Tests BS 2 times   No facility-administered medications prior to visit.    Review of Systems  Constitutional: Negative.   HENT: Negative.    Eyes: Negative.   Respiratory: Negative.  Cardiovascular:  Positive for chest pain.  Gastrointestinal: Negative.   Genitourinary: Negative.   Musculoskeletal: Negative.   Skin: Negative.   Neurological:  Positive for tremors and weakness.  Endo/Heme/Allergies: Negative.   Psychiatric/Behavioral: Negative.        Objective:     BP 120/68   Pulse 80   Temp 98.1 F (36.7 C) (Oral)   Ht 5' 5"  (1.651 m)   Wt 179 lb (81.2 kg)   SpO2 99%   BMI 29.79 kg/m    Physical Exam Vitals and nursing note reviewed.  Constitutional:      Appearance: Normal appearance.  HENT:     Right Ear: Tympanic membrane, ear canal and external ear normal. There is no impacted cerumen.     Left Ear: Tympanic membrane, ear canal and external ear normal. There is no impacted cerumen.     Nose: Nose normal. No congestion or rhinorrhea.     Mouth/Throat:     Mouth: Mucous membranes are moist.     Pharynx: Oropharynx is clear.  Eyes:     Extraocular Movements: Extraocular movements intact.     Pupils: Pupils are equal, round, and reactive to light.  Cardiovascular:     Rate and Rhythm: Normal rate and regular rhythm.     Pulses: Normal pulses.     Heart sounds: Normal heart sounds.  Pulmonary:     Effort: Pulmonary effort is normal.     Breath sounds: Normal breath sounds.  Abdominal:     General: Abdomen is flat. Bowel sounds are normal.  There is distension.     Palpations: Abdomen is soft. There is no shifting dullness or fluid wave.     Tenderness: There is no abdominal tenderness.  Musculoskeletal:        General: Normal range of motion.     Cervical back: Normal range of motion and neck supple.     Right lower leg: Edema present.     Left lower leg: Edema present.  Skin:    General: Skin is warm and dry.     Capillary Refill: Capillary refill takes less than 2 seconds.  Neurological:     General: No focal deficit present.     Mental Status: She is alert and oriented to person, place, and time.     Motor: Weakness and tremor present.     Gait: Gait abnormal (walks with walker. Can get on exam table with two person assist).  Psychiatric:        Mood and Affect: Mood normal.        Behavior: Behavior normal.        Thought Content: Thought content normal.        Judgment: Judgment normal.      No results found for any visits on 10/12/21.     Assessment & Plan:    Routine Health Maintenance and Physical Exam  Immunization History  Administered Date(s) Administered   Fluad Quad(high Dose 65+) 01/20/2019, 01/06/2020   Hep A / Hep B 06/15/2015, 07/15/2015, 12/27/2015   Influenza Split 01/16/2012   Influenza Whole 01/31/2010   Influenza, High Dose Seasonal PF 01/28/2014, 12/29/2014, 02/17/2016, 01/21/2018   Influenza-Unspecified 12/29/2012, 01/21/2021   PFIZER(Purple Top)SARS-COV-2 Vaccination 05/16/2019, 06/06/2019, 02/06/2020, 02/05/2021   Pneumococcal Conjugate-13 06/14/2017   Pneumococcal Polysaccharide-23 01/28/2012, 01/28/2014   Pneumococcal-Unspecified 04/24/2015   Tdap 12/29/2014   Zoster Recombinat (Shingrix) 03/13/2017, 05/24/2017   Zoster, Live 02/14/2012    Health Maintenance  Topic Date Due   FOOT EXAM  04/08/2019   HEMOGLOBIN A1C  03/11/2021   OPHTHALMOLOGY EXAM  08/29/2021   COVID-19 Vaccine (5 - Booster for Pfizer series) 10/13/2021 (Originally 04/02/2021)   INFLUENZA VACCINE   11/21/2021   COLONOSCOPY (Pts 45-24yr Insurance coverage will need to be confirmed)  02/02/2022   TETANUS/TDAP  12/28/2024   Pneumonia Vaccine 76 Years old  Completed   DEXA SCAN  Completed   Hepatitis C Screening  Completed   Zoster Vaccines- Shingrix  Completed   HPV VACCINES  Aged Out    Discussed health benefits of physical activity, and encouraged her to engage in regular exercise appropriate for her age and condition.  1. Medication refill - Medication refills done.  - Advised to follow up with specialists as directed - Labs done recently so we decided to forgo labs today  - Follow up in one year or sooner if needed  2. Type 2 diabetes mellitus with other specified complication, with long-term current use of insulin ([place] - Per endocrinology   3. Hypothyroidism, unspecified type - Per endocrinology   4. History of CVA (cerebrovascular accident) - Per cardiology   5. CAD in native artery - Per cardiology   6. Essential hypertension - Controlled. No change in medication   7. Dyslipidemia - Per cardiology  8. Paroxysmal atrial fibrillation (HCC)  - diltiazem (CARDIZEM CD) 120 MG 24 hr capsule; Take 1 capsule (120 mg total) by mouth daily.  Dispense: 90 capsule; Refill: 1  9. Lower extremity edema - Will have her take lasix 40 mg daily for 3 days and can stop if swelling improves. Continue to monitor.  - Low sodium diet - furosemide (LASIX) 40 MG tablet; Take 1 tablet (40 mg total) by mouth daily.  Dispense: 10 tablet; Refill: 0  10. Gait instability - Continue with PT - she is enjoying water aerobics and finds this helpful   11. Parkinsonism, unspecified Parkinsonism type ([facility] _ Per neurology. Current medication does not seem to be working   12. Anxiety  - escitalopram (LEXAPRO) 20 MG tablet; TAKE ONE TABLET BY MOUTH DAILY  Dispense: 90 tablet; Refill: 1  13. S/P TAVR (transcatheter aortic valve replacement) - Follow up with Cardiology as directed  14.  Hypertension, unspecified type  - diltiazem (CARDIZEM CD) 120 MG 24 hr capsule; Take 1 capsule (120 mg total) by mouth daily.  Dispense: 90 capsule; Refill: 1  15. Chronic diastolic heart failure (HCC)  - furosemide (LASIX) 40 MG tablet; Take 1 tablet (40 mg total) by mouth daily.  Dispense: 10 tablet; Refill: 0   CDorothyann Peng NP

## 2021-10-16 ENCOUNTER — Other Ambulatory Visit (HOSPITAL_BASED_OUTPATIENT_CLINIC_OR_DEPARTMENT_OTHER): Payer: Self-pay

## 2021-10-17 ENCOUNTER — Ambulatory Visit (INDEPENDENT_AMBULATORY_CARE_PROVIDER_SITE_OTHER): Payer: Medicare Other

## 2021-10-17 DIAGNOSIS — I2510 Atherosclerotic heart disease of native coronary artery without angina pectoris: Secondary | ICD-10-CM

## 2021-10-17 DIAGNOSIS — Z8673 Personal history of transient ischemic attack (TIA), and cerebral infarction without residual deficits: Secondary | ICD-10-CM

## 2021-10-17 DIAGNOSIS — E785 Hyperlipidemia, unspecified: Secondary | ICD-10-CM

## 2021-10-17 DIAGNOSIS — I480 Paroxysmal atrial fibrillation: Secondary | ICD-10-CM

## 2021-10-17 DIAGNOSIS — Z794 Long term (current) use of insulin: Secondary | ICD-10-CM

## 2021-10-17 DIAGNOSIS — I10 Essential (primary) hypertension: Secondary | ICD-10-CM

## 2021-10-17 NOTE — Chronic Care Management (AMB) (Signed)
Chronic Care Management   CCM RN Visit Note  10/17/2021 Name: Breanna Webster MRN: [id number] DOB: Aug 10, 1945  Subjective: Breanna Webster is a 76 y.o. year old female who is a primary care patient of [name], NP. The care management team was consulted for assistance with disease management and care coordination needs.    Engaged with patient by telephone for follow up visit in response to provider referral for case management and/or care coordination services.   Consent to Services:  The patient was given information about Chronic Care Management services, agreed to services, and gave verbal consent prior to initiation of services.  Please see initial visit note for detailed documentation.   Patient agreed to services and verbal consent obtained.   Assessment: Review of patient past medical history, allergies, medications, health status, including review of consultants reports, laboratory and other test data, was performed as part of comprehensive evaluation and provision of chronic care management services.   SDOH (Social Determinants of Health) assessments and interventions performed:    CCM Care Plan  Allergies  Allergen Reactions   Statins Other (See Comments)    Muscle aches    Outpatient Encounter Medications as of 10/17/2021  Medication Sig   apixaban (ELIQUIS) 5 MG TABS tablet Take 1 tablet (5 mg total) by mouth 2 (two) times daily.   b complex vitamins capsule Take 1 capsule by mouth daily.   BD PEN NEEDLE NANO 2ND GEN 32G X 4 MM MISC    carbidopa-levodopa (SINEMET) 25-250 MG tablet Take 1 tablet by mouth 3 (three) times daily.   cholecalciferol (VITAMIN D) 25 MCG (1000 UNIT) tablet Take 1,000 Units by mouth daily.   diltiazem (CARDIZEM CD) 120 MG 24 hr capsule Take 1 capsule (120 mg total) by mouth daily.   escitalopram (LEXAPRO) 20 MG tablet TAKE ONE TABLET BY MOUTH DAILY   estradiol (ESTRACE) 0.1 MG/GM vaginal cream Place vaginally.   Evolocumab (REPATHA  SURECLICK) 782 MG/ML SOAJ Inject 1 dose into the skin every 14 (fourteen) days.   furosemide (LASIX) 40 MG tablet Take 1 tablet (40 mg total) by mouth daily.   glucose blood (ONETOUCH VERIO) test strip USE TO CHECK BLOOD SUGAR THREE TIMES DAILY   Insulin Pen Needle (UNIFINE PENTIPS) 32G X 4 MM MISC Inject 4 times subcutaneously   Insulin Syringe-Needle U-100 (ULTICARE INSULIN SYRINGE) 31G X 5/16" 1 ML MISC Inject 4 times daily subcutaneously   Lancets (ONETOUCH DELICA PLUS [id number]) MISC Use twice daily   levothyroxine (SYNTHROID) 125 MCG tablet Take 1 tablet by mouth in the morning on an empty stomach once a day. 90 days   losartan (COZAAR) 50 MG tablet TAKE ONE TABLET BY MOUTH DAILY   metFORMIN (GLUCOPHAGE-XR) 500 MG 24 hr tablet Take 1 tablet by mouth 2 times daily   Multiple Vitamin (MULITIVITAMIN WITH MINERALS) TABS Take 1 tablet by mouth daily.   nitroGLYCERIN (NITROSTAT) 0.4 MG SL tablet Place 1 tablet (0.4 mg total) under the tongue every 5 (five) minutes as needed for chest pain.   NOVOLIN N 100 UNIT/ML injection Inject 28 Units into the skin daily before breakfast.   NOVOLIN R 100 UNIT/ML injection 8 Units 2 (two) times daily before a meal. 10 UNITS THIRTY MINUTES BEFORE MEALS.  5 ADDITIONAL UNITS WITH CARBS OR SNACKS. (04/26/2021:  Pt reports she only takes the 8 units 2x/daily before meals, not the 10 units)   omeprazole (PRILOSEC) 20 MG capsule Take 1 capsule (20 mg total) by mouth 2 (two)  times daily before a meal.   Vibegron (GEMTESA) 75 MG TABS 1 tablet PO QD   No facility-administered encounter medications on file as of 10/17/2021.    Patient Active Problem List   Diagnosis Date Noted   Left hemiparesis ([place]) 06/21/2021   Other specified hereditary hemolytic anemias ([place]) 06/21/2021   Parkinsonism ([place]) 06/21/2021   COVID-19 02/14/2021   Postviral fatigue syndrome 02/14/2021   Difficulty with adaptive servo-ventilation (ASV) use 02/14/2021   Sepsis secondary to UTI ([place])  09/08/2020   Elevated ALT measurement 09/08/2020   History of CVA with residual deficit 09/08/2020   Hyperbilirubinemia 09/08/2020   Fatigue associated with anemia 08/03/2020   Cerebrovascular accident (CVA) due to embolism of right posterior cerebral artery ([place]) 08/03/2020   OSA treated with BiPAP 08/03/2020   Complex sleep apnea syndrome 08/03/2020   Treatment-emergent central sleep apnea 08/03/2020   Chronic intermittent hypoxia with obstructive sleep apnea 04/21/2020   OSA (obstructive sleep apnea) 04/21/2020   History of cardioembolic stroke 40/98/1191   Gait disturbance, post-stroke 03/29/2020   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 ([place]) 03/29/2020   Anxiety    RLQ abdominal pain 10/22/2019   Paroxysmal atrial fibrillation ([place]) 05/22/2019   Iron deficiency anemia 05/07/2019   Atrial fibrillation with RVR ([place]) 10/21/2018   Cerebellar stroke, acute ([place]) 10/21/2018   Streptococcal endocarditis    Endocarditis of mitral valve 10/07/2018   Bacteremia due to Streptococcus Salivarius 10/07/2018   Sepsis ([place]) 47/82/9562   Acute metabolic encephalopathy 13/11/6576   Severe aortic stenosis 07/08/2018   S/P TAVR (transcatheter aortic valve replacement) 07/08/2018   Esophageal thickening    CAD in native artery 04/22/2018   Gastroesophageal reflux disease    Pulmonary hypertension ([place]) 02/21/2018   Essential hypertension 07/15/2017   History of colonic polyps 05/22/2017   Elevated liver function tests 12/05/2016   DOE (dyspnea on exertion) 07/19/2016   Thalassemia minor 05/29/2016   Left bundle branch block 12/06/2015   Upper airway cough syndrome 46/96/2952   Diastolic heart failure ([place]) 10/10/2015   Myalgia 02/17/2014   Carotid artery stenosis 06/05/2013   Eustachian tube dysfunction 05/07/2013   Neuropathy of leg 03/07/2012   Anemia, unspecified 01/31/2012   Hot flashes 08/24/2010   Diabetes mellitus due to underlying condition, uncontrolled 06/30/2010    Generalized abdominal pain 03/24/2010   Obesity (BMI 30.0-34.9) 12/21/2009   IBS (irritable bowel syndrome) 04/29/2009   Vitamin D deficiency 03/10/2009   Acute bronchitis 03/07/2009   Hypothyroidism 12/13/2008   Dyslipidemia 12/13/2008   Anxiety state 12/13/2008   Other specified disorders of bladder 12/13/2008    Conditions to be addressed/monitored:Atrial Fibrillation, CAD, HTN, and HLD  Care Plan : RN Care Manager Plan of Care  Updates made by [name], RN since 10/17/2021 12:00 AM     Problem: Chronic Disease Management and Care Coordination Needs (DM2, CAD, Afib,HTN. Hx CVA )   Priority: High     Long-Range Goal: Establish Plan of Care for Chronic Disease Management Needs (DM2, CAD, Afib,HTN. Hx CVA )   Start Date: 03/09/2021  Expected End Date: 07/20/2022  Recent Progress: On track  Priority: High  Note:   Current Barriers:  Care Coordination needs related to Financial constraints related to cost of medications Chronic Disease Management support and education needs related to Atrial Fibrillation, CAD, HTN, DMII, GERD, Overactive Bladder, and hx CVA Pt states she is still going to water therapy and she states she feels better when she is in the water. States  she is  walking  with a cane now.  States she saw Dr. Wynelle Link and she got injections in her knees. States she has not noticed if the gel shots have helped yet.   Denies any recent falls.  States her energy level is getting better.  States she does have to get up 1-2 times a night to urinate. States the [place] she is taking is very expensive.  Denies any chest pains but does get winded with exertion.  States  she has not heard from the new endocrinologist about setting up an appointment.  States her CBGs had been AM ranges of 102-134 and PM 64-200.  States she only has  lower reading  when she does not wake up from her nap soon enough.  States she has not been checking her B/P at home as she has checked at her  doctors appointments RNCM Clinical Goal(s):  Patient will verbalize understanding of plan for management of Atrial Fibrillation, CAD, HTN, DMII, GERD, Overactive Bladder, and hx CVA as evidenced by voiced adherence to plan of care verbalize basic understanding of  Atrial Fibrillation, CAD, HTN, DMII, GERD, Overactive Bladder, and hx CVA disease process and self health management plan as evidenced by voiced understanding and teach back take all medications exactly as prescribed and will call provider for medication related questions as evidenced by dispense report and pt verbalization attend all scheduled medical appointments:  Neurology 10/25/21, CCM PharmD 11/06/21, Annual Wellness Visit 04/05/22 as evidenced by medical records demonstrate Improved adherence to prescribed treatment plan for Atrial Fibrillation, CAD, HTN, DMII, GERD, Overactive Bladder, and hx CVA as evidenced by readings within limits, voiced adherence to plan of care continue to work with RN Care Manager to address care management and care coordination needs related to  Atrial Fibrillation, CAD, HTN, DMII, GERD, Overactive Bladder, and hx CVA as evidenced by adherence to CM Team Scheduled appointments work with pharmacist to address Financial constraints related to cost of Myrbetriq and Eliquis related toAtrial Fibrillation, CAD, HTN, DMII, GERD, Overactive Bladder, and hx CVA as evidenced by review or EMR and patient or pharmacist report through collaboration with RN Care manager, provider, and care team.   Interventions: 1:1 collaboration with primary care provider regarding development and update of comprehensive plan of care as evidenced by provider attestation and co-signature Inter-disciplinary care team collaboration (see longitudinal plan of care) Evaluation of current treatment plan related to  self management and patient's adherence to plan as established by provider  Falls Interventions:  (Status:  Goal on track:  Yes.) Long  Term Goal Reviewed medications and discussed potential side effects of medications such as dizziness and frequent urination Advised patient of importance of notifying provider of falls Assessed for falls since last encounter Assessed patients knowledge of fall risk prevention secondary to previously provided education Reinforced to continue to do exercises PT is teaching her to get stronger and going to water therapy.  Reinforced to use cane at all times     AFIB Interventions: (Status:  Goal on track:  Yes.) Long Term Goal   Counseled on increased risk of stroke due to Afib and benefits of anticoagulation for stroke prevention Reviewed importance of adherence to anticoagulant exactly as prescribed Counseled on bleeding risk associated with Eliquis and importance of self-monitoring for signs/symptoms of bleeding Counseled on avoidance of NSAIDs due to increased bleeding risk with anticoagulants Counseled on seeking medical attention after a head injury or if there is blood in the urine/stool Afib action plan reviewed  Reviewed instructions for upcoming abdominal U/S and that she can take her Eliquis with a sip of water  that morning   CAD Interventions: (Status:  Goal on track:  Yes.) Long Term Goal Assessed understanding of CAD diagnosis Medications reviewed including medications utilized in CAD treatment plan Provided education on importance of blood pressure control in management of CAD Provided education on Importance of limiting foods high in cholesterol Counseled on importance of regular laboratory monitoring as prescribed Reviewed Importance of taking all medications as prescribed Reviewed Importance of attending all scheduled provider appointments Advised to report any changes in symptoms or exercise tolerance Reviewed s/sx to call provider or 911   Diabetes Interventions:  (Status:  Goal on track:  Yes.) Long Term Goal Assessed patient's understanding of A1c goal: <7% Provided  education to patient about basic DM disease process Reviewed medications with patient and discussed importance of medication adherence Counseled on importance of regular laboratory monitoring as prescribed Discussed plans with patient for ongoing care management follow up and provided patient with direct contact information for care management team Advised patient, providing education and rationale, to check cbg twice a day and record, calling provider for findings outside established parameters Reinforced with pt need to get new endocrinologist. Communicated with provider that pt has not been contacted about referral for new endocrinologist.  Reinforced to try to eat lunch or small snack before taking her nap to help prevent hypoglycemia Lab Results  Component Value Date   HGBA1C 7.4 (H) 09/08/2020  6.3% 08/28/21 at endocrinology  Hyperlipidemia Interventions:  (Status:  Goal on track:  Yes.) Long Term Goal Medication review performed; medication list updated in electronic medical record.  Provider established cholesterol goals reviewed Counseled on importance of regular laboratory monitoring as prescribed Reviewed role and benefits of statin for ASCVD risk reduction Reviewed importance of limiting foods high in cholesterol Reviewed exercise goals and target of 150 minutes per week Reinforced to continue Repatha   Hypertension Interventions:  (Status:  Goal on track:  Yes.) Long Term Goal Last practice recorded BP readings:  BP Readings from Last 3 Encounters:  10/12/21 120/68  09/06/21 120/65  08/18/21 120/60  Most recent eGFR/CrCl: No results found for: EGFR  No components found for: CRCL  Evaluation of current treatment plan related to hypertension self management and patient's adherence to plan as established by provider Provided education to patient re: stroke prevention, s/s of heart attack and stroke Reviewed medications with patient and discussed importance of  compliance Counseled on the importance of exercise goals with target of 150 minutes per week Discussed plans with patient for ongoing care management follow up and provided patient with direct contact information for care management team Advised patient, providing education and rationale, to monitor blood pressure daily and record, calling PCP for findings outside established parameters Provided education on prescribed diet low sodium low CHO heart healthy  Stroke:  (Status:Goal on track:  Yes.) Long Term Goal Reviewed Importance of taking all medications as prescribed Advised to report any changes in symptoms or exercise tolerance Assessed for management of bladder and/or bowel incontinence Reinforced to keep appointment with neurology. Reinforced to continue PT to help with balance and strength    Urinary frequency Goal on track:  Yes. Long Term Goal Evaluation of current treatment plan related to Overactive Bladder, Financial constraints related to cost of Myrbetric  self-management and patient's adherence to plan as established by provider. Discussed plans with patient for ongoing care management follow up and provided patient  with direct contact information for care management team Evaluation of current treatment plan related to Urinary frequency and patient's adherence to plan as established by provider Pharmacy referral for communicated to PharmD pts issues with cost of Gemtesa  Discussed plans with patient for ongoing care management follow up and provided patient with direct contact information for care management team Reinforced to limit fluids later in the evening to help decrease night time trips to the bathroom. Reinforced to keep appointments  Dr. [name] and urology. Reviewed to contact urology office to see if she can get samples of Gemtesa or change her something less expensive    Patient Goals/Self-Care Activities: Take all medications as prescribed Attend all scheduled  provider appointments Call pharmacy for medication refills 3-7 days in advance of running out of medications Call provider office for new concerns or questions  keep appointment with eye doctor check blood sugar at prescribed times: twice daily and when you have symptoms of low or high blood sugar fill half of plate with vegetables manage portion size keep feet up while sitting wash and dry feet carefully every day wear comfortable, cotton socks wear comfortable, well-fitting shoes check pulse (heart) rate once a day make a plan to eat healthy take medicine as prescribed check blood pressure daily choose a place to take my blood pressure (home, clinic or office, retail store) call doctor for signs and symptoms of high blood pressure keep all doctor appointments eat more whole grains, fruits and vegetables, lean meats and healthy fats call for medicine refill 2 or 3 days before it runs out take all medications exactly as prescribed call doctor with any symptoms you believe are related to your medicine Follow Up Plan:  Telephone follow up appointment with care management team member scheduled for:  11/20/21 The patient has been provided with contact information for the care management team and has been advised to call with any health related questions or concerns.         Plan:Telephone follow up appointment with care management team member scheduled for:  11/20/21 The patient has been provided with contact information for the care management team and has been advised to call with any health related questions or concerns.  Peter Garter RN, Jackquline Denmark, CDE Care Management Coordinator [facility] [phone]

## 2021-10-18 ENCOUNTER — Ambulatory Visit (HOSPITAL_COMMUNITY)
Admission: RE | Admit: 2021-10-18 | Discharge: 2021-10-18 | Disposition: A | Discharge status: Home / Self Care | Payer: Medicare Other | Source: Ambulatory Visit | Attending: Gastroenterology | Admitting: Gastroenterology

## 2021-10-18 DIAGNOSIS — K7460 Unspecified cirrhosis of liver: Secondary | ICD-10-CM | POA: Insufficient documentation

## 2021-10-19 ENCOUNTER — Ambulatory Visit: Payer: Medicare Other | Admitting: Physical Therapy

## 2021-10-19 ENCOUNTER — Ambulatory Visit: Payer: Medicare Other | Attending: Adult Health | Admitting: Occupational Therapy

## 2021-10-19 ENCOUNTER — Telehealth: Payer: Self-pay | Admitting: Neurology

## 2021-10-19 ENCOUNTER — Ambulatory Visit: Payer: Medicare Other | Admitting: Occupational Therapy

## 2021-10-19 DIAGNOSIS — R29818 Other symptoms and signs involving the nervous system: Secondary | ICD-10-CM | POA: Insufficient documentation

## 2021-10-19 DIAGNOSIS — R2681 Unsteadiness on feet: Secondary | ICD-10-CM | POA: Insufficient documentation

## 2021-10-19 NOTE — Therapy (Signed)
[facility] [address] [place], [place], [address] Phone: [phone]   Fax:  [phone]  Patient Details  Name: LIZANIA BOUCHARD MRN: [id number] Date of Birth: 1946-04-22 Referring Provider:  [name], NP  Encounter Date: 10/19/2021  Physical Therapy Parkinson's Disease Screen   Timed Up and Go test:12.72 seconds  10 meter walk test:13.47 seconds = 2.43 ft/sec  5 time sit to stand test:16.3 seconds with BUE support   Currently getting aquatic therapy with Vinnie Level, PT. Pt going on vacation in about a month. Plan is for pt to continue with aquatic therapy and return to the gym. Pt will receive OT eval in a few months and after she is finished with OT eval, then will plan to get pt scheduled for another screen in 6 months (or see pt earlier for an eval if needed). Pt in agreement with plan.    Arliss Journey, PT, DPT  10/19/2021, 12:50 PM  [facility] [address] [place], [place], [address] Phone: [phone]   Fax:  [phone]

## 2021-10-19 NOTE — Telephone Encounter (Signed)
  Dear [name], Will you cal [name] back when you feel ready to start OT ? Happy to refer you when the time is right for you.  Happy 4th July,  C Carolan Avedisian.   Hulda Marin, OT  [name], Asencion Partridge, MD Cc: Breanna Webster, OT Dr. Brett Fairy,  Breanna Webster was seen for Parkinson's screens at [facility]. She demonstrates decreased LUE coordination and ROM deficits. She can benefit from an OT evaluation at [facility].  Please place this referral if you agree. Pt would like to wait a couple of months to schedule this eval due to upcoming eye surgery and a desire to finish aquatic PT.  Thanks,  [employer], OTR/L

## 2021-10-19 NOTE — Therapy (Signed)
Occupational Therapy Parkinson's Disease Screen  Hand dominance:   RUE  Physical Performance Test item #2 (simulated eating):  10.91 sec  Fastening/unfastening 3 buttons in:  36.09sec  9-hole peg test:    RUE  25.59 sec        LUE  37.78 sec  Box & Blocks Test:   RUE  48 blocks        LUE  41 blocks    Change in ability to perform ADLs/IADLs:  difficulty using LUE due to tremor, and decreased coordination  Other Comments:  lengthy discussion with pt/ husband regarding benefits of OT. Pt is in agreement with OT evaluation and treatment after she finishes with aquatic PT.   Theone Murdoch, OTR/L [phone] Phone: [phone] 5:09 PM 10/19/21

## 2021-10-20 ENCOUNTER — Other Ambulatory Visit (HOSPITAL_BASED_OUTPATIENT_CLINIC_OR_DEPARTMENT_OTHER): Payer: Self-pay

## 2021-10-20 DIAGNOSIS — I10 Essential (primary) hypertension: Secondary | ICD-10-CM | POA: Diagnosis not present

## 2021-10-20 DIAGNOSIS — E785 Hyperlipidemia, unspecified: Secondary | ICD-10-CM

## 2021-10-20 DIAGNOSIS — I2510 Atherosclerotic heart disease of native coronary artery without angina pectoris: Secondary | ICD-10-CM | POA: Diagnosis not present

## 2021-10-20 DIAGNOSIS — I4891 Unspecified atrial fibrillation: Secondary | ICD-10-CM

## 2021-10-20 DIAGNOSIS — E1159 Type 2 diabetes mellitus with other circulatory complications: Secondary | ICD-10-CM

## 2021-10-20 DIAGNOSIS — Z794 Long term (current) use of insulin: Secondary | ICD-10-CM

## 2021-10-25 ENCOUNTER — Other Ambulatory Visit (HOSPITAL_BASED_OUTPATIENT_CLINIC_OR_DEPARTMENT_OTHER): Payer: Self-pay

## 2021-10-25 ENCOUNTER — Ambulatory Visit (INDEPENDENT_AMBULATORY_CARE_PROVIDER_SITE_OTHER): Payer: Medicare Other | Admitting: Neurology

## 2021-10-25 VITALS — BP 136/62 | HR 81 | Ht 65.0 in | Wt 178.5 lb

## 2021-10-25 DIAGNOSIS — I2510 Atherosclerotic heart disease of native coronary artery without angina pectoris: Secondary | ICD-10-CM | POA: Diagnosis not present

## 2021-10-25 DIAGNOSIS — G214 Vascular parkinsonism: Secondary | ICD-10-CM

## 2021-10-25 MED ORDER — ROPINIROLE HCL 0.25 MG PO TABS
0.2500 mg | ORAL_TABLET | Freq: Three times a day (TID) | ORAL | 5 refills | Status: DC
  Filled 2021-10-25: qty 90, 30d supply, fill #0
  Filled 2022-02-09: qty 90, 30d supply, fill #1

## 2021-10-25 NOTE — Patient Instructions (Signed)
Ropinirole Oral Tablets What is this medication? ROPINIROLE (roe PIN i role) is used to treat symptoms of Parkinson's disease. It is also used to treat Restless Legs Syndrome. This medicine may be used for other purposes; ask your health care provider or pharmacist if you have questions. COMMON BRAND NAME(S): Requip What should I tell my care team before I take this medication? They need to know if you have any of these conditions: heart disease high blood pressure kidney disease liver disease narcolepsy smoke tobacco cigarettes an unusual or allergic reaction to ropinirole, other medicines, foods, dyes, or preservatives pregnant or trying to get pregnant breast-feeding How should I use this medication? Take this medicine by mouth with water. Take it as directed on the prescription label. You can take it with or without food. If it upsets your stomach, take it with food. Keep taking this medicine unless your health care provider tells you to stop. Stopping it too quickly can cause serious side effects. It can also make your condition worse. Talk to your health care provider about the use of this medicine in children. Special care may be needed. Overdosage: If you think you have taken too much of this medicine contact a poison control center or emergency room at once. NOTE: This medicine is only for you. Do not share this medicine with others. What if I miss a dose? If you miss a dose, take it as soon as you can. If it is almost time for your next dose, take only that dose. Do not take double or extra doses. What may interact with this medication? alcohol antihistamines for allergy, cough and cold certain medicines for depression, anxiety, or psychotic disturbances certain medicines for seizures like phenobarbital, primidone certain medicines for sleep ciprofloxacin female hormones, like estrogens and birth control pills fluvoxamine general anesthetics like halothane, isoflurane,  methoxyflurane, propofol medicines for blood pressure medicines that relax muscles for surgery metoclopramide narcotic medicines for pain rifampin tobacco smoking This list may not describe all possible interactions. Give your health care provider a list of all the medicines, herbs, non-prescription drugs, or dietary supplements you use. Also tell them if you smoke, drink alcohol, or use illegal drugs. Some items may interact with your medicine. What should I watch for while using this medication? Visit your health care provider for regular checks on your progress. Tell your health care provider if your symptoms do not start to get better or if they get worse. Do not suddenly stop taking this medicine. You may develop a severe reaction. Your health care provider will tell you how much medicine to take. If your health care provider wants you to stop the medicine, the dose may be slowly lowered over time to avoid any side effects. You may get drowsy or dizzy. Do not drive, use machinery, or do anything that needs mental alertness until you know how this drug affects you. Do not stand or sit up quickly, especially if you are an older patient. This reduces the risk of dizzy or fainting spells. Alcohol may interfere with the effect of this medicine. Avoid alcoholic drinks. When taking this medicine, you may fall asleep without notice. You may be doing activities like driving a car, talking, or eating. You may not feel drowsy before it happens. Contact your health care provider right away if this happens to you. There have been reports of increased sexual urges or other strong urges such as gambling while taking this medicine. If you experience any of these while taking  this medicine, you should report this to your health care provider as soon as possible. Your mouth may get dry. Chewing sugarless gum or sucking hard candy and drinking plenty of water may help. Contact your health care provider if the problem  does not go away or is severe. What side effects may I notice from receiving this medication? Side effects that you should report to your doctor or health care professional as soon as possible: allergic reactions (skin rash, itching or hives; swelling of the face, lips, or tongue) changes in emotions or moods changes in vision confusion depressed mood increased blood pressure falling asleep during normal activities like driving fast, irregular heartbeat hallucinations low blood pressure (dizziness; feeling faint or lightheaded, falls; unusually weak or tired) new or increased gambling urges, sexual urges, uncontrolled spending, binge or compulsive eating, or other urges uncontrollable head, mouth, neck, arm, or leg movements Side effects that usually do not require medical attention (report to your doctor or health care professional if they continue or are bothersome): constipation dizziness drowsiness dry mouth headache nausea This list may not describe all possible side effects. Call your doctor for medical advice about side effects. You may report side effects to FDA at 1-800-FDA-1088. Where should I keep my medication? Keep out of the reach of children and pets. Store at room temperature between 20 and 25 degrees C (68 and 77 degrees F). Protect from light and moisture. Keep the container tightly closed. Get rid of any unused medicine after the expiration date. To get rid of medicines that are no longer needed or have expired: Take the medicine to a medicine take-back program. Check with your pharmacy or law enforcement to find a location. If you cannot return the medicine, check the label or package insert to see if the medicine should be thrown out in the garbage or flushed down the toilet. If you are not sure, ask your health care provider. If it is safe to put it in the trash, take the medicine out of the container. Mix the medicine with cat litter, dirt, coffee grounds, or other  unwanted substance. Seal the mixture in a bag or container. Put it in the trash. NOTE: This sheet is a summary. It may not cover all possible information. If you have questions about this medicine, talk to your doctor, pharmacist, or health care provider.  2023 [employer] (2019-12-02 00:00:00)

## 2021-10-25 NOTE — Progress Notes (Signed)
[facility] [address] [place] [address] [phone]       OFFICE FOLLOW UP NOTE  [name] Date of Birth:  12-Jun-1945 Medical Record Number:  [id number]   Reason for Referral: stroke follow up    CHIEF COMPLAINT:  Chief Complaint  Patient presents with   Follow-up    RM 10 with husband. Last seen 08/14/21.      HPI:  RV 10-25-2021, This 76 year -old  patient reports she became nauseated on carbi-levo dopa, used it TID and BID, in 2 different doses. and stopped 5 days ago,  After trying carbidopa levodopa.  We had tried to ease her and was able twice daily regimen but this was not tolerated either.  She had taken the medication with a cracker with water before the meal and she has still not seen on affect neither has her husband seen an effect on her left hand dominant tremor. We are meeting today to see if this is a vascular parkinsonism that could benefit from a dopaminergic agonist instead of dopamine replacement therapy.  I would like to reiterate that this patient had multifocal bihemispheric cardioembolic strokes after new onset A-fib related to an endocarditis. I will now place her on Ropinorol.  She reports her left leg feels heavy and clumsy. Her balance has improved. Her resting tremor happened almost suddenly, and I suspected a vascular onset .  Occupation therapy documented improvement but she is weaker in the left hand, less pinch strength and slower.       Rv: 06-21-2021: Rv with Larey Seat, MD  Breanna Webster is a 76 y.o. year old female presented with increased shortness of breath and confusion x2 weeks along with headache on 10/21/2018 with recent admission on 10/03/2018 for sepsis and mitral valve endocarditis.  Stroke work-up revealed multifocal bilateral anterior and posterior infarcts cardioembolic pattern secondary to new onset A. fib versus recent endocarditis. Vascular risk factors include history of DVT, aortic  stenosis status post TAVR 06/2018, pulmonary hypertension, HTN, DM, CAD status post stenting 03/2018, endocarditis, and A. Fib on Eliquis. Prior dx of sleep apnea but on recent sleep study no evidence of sleep apnea She has seen ophthalmologist, Dr. Katy Fitch, and he felt strongly that her vision changes were due to stroke.  She is on Eloquis. Transient right eye vision loss, attributed to CVA, atrial fibrillation. She never felt any symptoms such as palpitations. Amaurosis fugax, 2 minutes, resolved- diplopia resolved within seconds of onset.   Plans on following up with Dr. Katy Fitch for further evaluation including visual field testing.  Left hand tremor, worsening: this was present since prior stroke. Its larger amplitude. It's left side only- and looks very parkinsonian.  Cerebral angiogram was discussed with Dr.Groat. [name] is her cardiologist.   10/21/2018 with recent admission on 10/03/2018 for sepsis and mitral valve endocarditis.  Stroke work-up revealed multifocal bilateral anterior and posterior infarcts cardioembolic pattern secondary to new onset A. fib versus recent endocarditis. Vascular risk factors include history of DVT, aortic stenosis status post TAVR 06/2018, pulmonary hypertension, HTN, DM, CAD status post stenting 03/2018, endocarditis, and A. Fib on Eliquis. Prior dx of sleep apnea but on recent sleep study no evidence of sleep apnea    Update 04/13/2021 Breanna Webster- Stroke  Patient returns for acute visit due to increased falls and multiple other concerns.  Falls:  ED eval 12/14 post fall with c/o left flank pain and HA. CTH no acute finding. Lab work  showed stable chronic anemia and hyperglycemia with improvement after D50. Fall occurred after "tripping over her feet" per husband. Per ED note, left orbital and left flank bruising. CT abdomen unremarkable. Reports fall 2 weeks ago where she lost her balance -felt like she was being pulled towards the left.  Denies associated  dizziness at that time but does have chronic intermittent dizziness. Imbalance present since her stroke. Does admit to limited physical activity or exercise due to fear of falling - she reports she keeps busy going to different doctors appointments. She does report chronic b/l knee and lower back pain.  Chronic imbalance from stroke 2.5 years ago. She is concerned that this has worsened over the past 2 weeks and very concerned about possible new stroke. Denies any increased stressors or new medications.   Left Lower Breast Pain:  complaining of sharp left breast pain which started after fall last week. It is constant but worse with coughing, sneezing, lying down and with deep breaths. Radiates into back and right midline anterior. CT abdomen obtained at ED visit which was unremarkable. She is trying to schedule an appt with her cardiologist. Has appt with PCP tomorrow morning.   Transient right eye vision loss:  chronic issue. Prior extensive work-up unremarkable. Plans on following up with Dr. Katy Fitch for further evaluation Left hand tremor, worsening: this was present since prior stroke. Feels like this is worsening as it is "moving up my arm" where previously only in hand. Does not interfere with daily activity or functioning.    She reports compliance with Eliquis 5 mg twice daily. Plans on starting Repatha due to continued elevated LDL despite statin usage.  Both Eliquis and Repatha management cardiology. Followed by PCP for DM management - prior A1c 7.4. noted hypoglycemia during recent ED visit - denies this occurring at home. No further concerns at this time      History provided for reference purposes only Update 03/14/2021 AL: she called yesterday reporting a 5-15 minute episode of severe dizziness. Symptoms resolved spontaneously. She reports having a similar event 2-3 weeks ago of sudden dizziness. She reports not being able to stand up due to dizziness. She states that she was dry  heaving. She was very nauseous. Symptoms lasted about 5 minutes. She also reports having "black out of right eye vision" that lasts 20 seconds twice over the past 2-3 weeks. Not associated with dizziness or headache. She has had a visual disturbance (husband states she has not mentioned this to him) a couple of times over the past few days where she sees a zig zag line in the right eye.  Again, no headache or dizziness associated with visual disturbance. She had a similar event of right sided vision loss in 03/2020. CTA head and neck unremarkable for acute process. Right paratracheal nodule noted. She has seen optometry but not ophthalmologist. She does not drink much water due to overactive bladder. She drinks coffee in the mornings and usually tea at night.    She continues Eliquis, diltiazem, losartan. BP was 148/71 at beginning of visit, repeated manually 15 minutes later and was 140/60. Orthostatics 125/74 P 77 lying, 119/66 P 78 with standing and patient reported dizziness, 115/62 P 83 standing (less dizzy) and 115/58 P 80 after three minutes standing (less dizzy). She reports allergy to statin therapy. She has follow up echo with cardiology in 2 weeks. She was started on abx and steroids yesterday following visit with pulmonology for productive cough. BP was 112/72 at that  visit.    Update 06/22/2020 JM: Breanna Webster returns for stroke follow-up accompanied by her husband. Reports continued mild left hand tremor with mild left hand weakness -stable without worsening Prior complaints of gait unsteadiness -reports great improvement after working with PT -she has been able to increase ambulation distance - per husband, able to walk 3-4 blocks without stopping where previously difficulty ambulating very short distances She also reports occasional dizziness typically with position changes that last short duration  She does report in December, she presented to the ED with acute onset right eye visual loss  which lasted approximately 15 minutes and then completely resolved.  CTA head/neck unremarkable. Reports evaluation by ophthalmology with no concerning findings that may have possibly contributed.  She has not had any additional visual loss or visual symptoms  Reports compliance on Eliquis 5 mg twice daily -denies bleeding or bruising Glucose levels stable with recent A1c 6.5 (04/2020) Routinely follows with cardiology, PCP and endocrinology  Due to concerns of continued excessive daytime fatigue, she was referred to [facility] for possible repeat sleep study as prior sleep study in 08/2018 was negative for sleep apnea.  Evaluated by Dr. Brett Fairy and underwent sleep study on 03/29/2020 which showed AHI 12.8 with REM sleep associated with continuous low oxygen saturation with total O2 desat time 70 minutes with SPO2 nadir 76% and mild PLM.  Underwent CPAP titration 04/21/2020 with apnea unresponsive to hypopneas, apnea and desaturations but OSA responded partially to BiPAP 14/10 with central sleep apnea present with higher BiPAP pressures, sleep-related hypoxemia improved greatly on CPAP and BiPAP, presence of mild PLM confirmed.  Recommended initiating auto BiPAP which she started on 2/9.  She appears to be tolerating without difficulty but concerned as she continues to feel daytime fatigue.  No further concerns at this time   Update 12/21/2019 JM: Breanna Webster returns for stroke follow-up.  She is accompanied by her husband. Residual deficits of left hand tremor, unsteadiness and short-term memory impairment.  She will have occasional dizziness but typically upon standing from seated position.  Denies any reoccurring diplopia Prior concern of worsening diplopia, imbalance, dizziness and left hand shaking therefore obtain MRI which was negative for acute abnormality She was referred to PT at prior visit but for unknown reason, they were not called to schedule initial evaluation.  She is interested  in pursuing PT Denies new stroke/TIA symptoms Remains on Eliquis 5 mg twice daily without bleeding or bruising for secondary stroke prevention and history of atrial fibrillation Blood pressure today 128/64 Glucose levels stable per patient HTN, HLD and DM currently managed by PCP She has multiple other complaints today: She also reports excessive daytime fatigue, insomnia, occasional nocturia and snoring Underwent sleep study approximately 1 year ago which showed mild sleep apnea with AHI 5.0.  Does report worsening daytime fatigue and insomnia and she continues to question accuracy of sleep study results She is currently being evaluated by neurosurgery for right underarm and chest numbness/tingling with concerns of thoracic involvement.  Plans on obtaining MRI cervical and thoracic scheduled on 01/15/2020 No further concerns at this time  Update 09/14/2019 JM: Breanna Webster is a 75 year old female with underlying medical history of DVT history, aortic stenosis s/p TAVR 06/2018, hypothyroidism, pulmonary hypertension, HTN, DM, CAD s/p stenting 03/2018, hx of infective endocarditis 09/2018 and multifocal bilateral anterior posterior infarcts secondary to new onset A. fib in 09/2018.  Patient requests appointment today with concern for intermittent double vision, imbalance, dizziness and left hand shaking.  She is accompanied by her husband.  Previously seen roughly 6 months ago stable from a stroke standpoint and advised to follow-up as needed.  Prior complaints of intermittent diplopia, left hemiparesis and imbalance secondary to multiple cardioembolic strokes throughout the supratentorial brain, brainstem and cerebellum including multiple lesions within the corpus callosum.  She believes she continued to have intermittent symptoms but believes over the past few months symptoms have worsened or become more frequent.  she did have ophthalmology evaluation on 09/07/2019 with concern of bilateral diplopia  worsened with visual tasks with saccadic changes.  Experiences worsening dizziness or diplopia while watching TV for too long.  Also experience dizziness if standing too quickly or rapid head movements.  Symptoms only last for 10 to 20 seconds and then resolve.  Episodes occur 1-2 times per week.  Denies headache, migraine, worsening weakness, numbness/tingling, speech impairment or confusion.  She endorses ongoing compliance with all prescribed medications including Eliquis 5 mg twice daily.  Blood pressure monitored at home which has been stable with today's reading 125/75.  Underlying mild cognitive impairment post stroke which has been stable without worsening.  She questions clearance for driving.  Continues to follow with PCP for HTN, HLD and DM management.  No further concerns at this time.  Update 03/12/2019 JM: Breanna Webster is a 76 year old female who is being seen today for stroke follow-up accompanied by her husband.  She continues to experience balance difficulties, mild left hemiparesis and mild cognitive impairment without much improvement.  Her greatest concern today is ongoing shortness of breath which has been present since the beginning of this year.  Her shortness of breath severely limits her daily functioning and activity.  Recently seen by Dr. Tamala Julian, her cardiologist, who felt continued shortness of breath was multifactorial but highly encouraged increasing activity and possibly cardiac rehab.  She feels as though "something is being missed" and is overly frustrated.  She also continues to experience excessive daytime fatigue and insomnia at night.  Prior sleep study negative for OSA.  She continues on Eliquis and aspirin without bleeding or bruising.  Blood pressure today satisfactory at 128/78.  Continues on home Lexapro 20 mg daily and feels as though depression and anxiety stable.  She also continues on amitriptyline 25 mg nightly for prior concerns with headache.  She denies any  reoccurring headaches at this time.  No further concerns at this time.  Initial visit 12/10/2018 JM: Residual deficits of mild left hemiparesis, mild left facial droop with occasional speech difficulty, balance difficulties, decreased vision and short-term memory concerns.  She does have difficulty ambulating at times stating her legs feel "rubbery" typically worse worsening in the morning and in the evening.  She is able to ambulate without assistive device.  Prior complaints of left-sided vision with sensation of a shade pulled over her left eye but this is since resolved and recently obtained new prescription glasses by ophthalmology.  She also endorses short-term memory concerns such as forgetting recent question/answer or her husband having to continuously repeat himself.  She denies improvement but her husband states he has seen improvement of her overall ambulation and memory concerns.  She has not received any therapy as this was not recommended at discharge.  She is questioning possible participation in therapy. Previously experiencing left-sided severe headache with recent cessation with continued use of  amitriptyline 25 mg nightly and gabapentin 300 mg nightly.  It was recommended to discontinue gabapentin 300 mg nightly by PCP as she did not gain  benefit but she is not aware of this and has continued.  Symptoms previously located left occipital radiating to frontal region with short duration sharp stabbing radiating pain which appears to be secondary to possible occipital neuralgia. Previously underwent sleep study for possible OSA which was negative Continues on Eliquis and aspirin 81 mg without bleeding or bruising Continues to follow with PCP for DM management Blood pressure today 107/60 States depression/anxiety well controlled with continuation of Lexapro No further concerns at this time  Stroke admission 10/21/2018: Breanna Webster is a 76 y.o. female with history of DVT, aortic  stenosis s/p TAVR in March 2020, hypothyroidism, pulmonary hypertension, HTN, CADs/p stenting December 2019 recently admitted for infective endocarditis on the mitral valve treated with 2 weeks gentamicin and ceftriaxone via PEG who presented on 10/21/2018 to the hospital with increased shortness of breath and confusion x2 weeks along with headache.  No focal neurologic symptoms.  New A. Fib with RVR found in the ED and started on heparin drip.  Neurology consulted with work-up revealing multifocal bilateral anterior and posterior infarcts cardioembolic pattern as evidenced on MRI secondary to new onset A. fib versus recent endocarditis.  MRI showed diffuse scattered subcentimeter foci of reduced diffusion and intermediate diffusion throughout the supratentorial brain, brainstem and cerebellum including multiple lesions within the corpus callosum.  Vessel imaging unremarkable with only mild arthrosclerotic bilateral ICA bifurcations.  2D echo normal EF without mention of vegetation.  Recommended initiating D [place] with new onset of A. fib.  HTN stable.  LDL 85 and due to history of statin allergy with myalgias, statin initiation was not recommended.  A1c 7.5 and recommended follow-up with PCP for uncontrolled DM.  Other stroke risk factors include advanced age, obesity, CAD, MVR and status post TAVR 06/2018.  She was discharged home in stable condition without therapy.  She returned to ED on 11/17/2018 with complaints of sudden onset left-sided headache located left parietal occipital area at the base of her neck.  CTA unremarkable for dissection or other abnormality.  She did receive 2 migraine cocktails with improvement of headache symptoms.  She has since followed up with her PCP who initiated gabapentin along with continuation of nortriptyline 50 mg nightly.  She apparently did not gain benefit from gabapentin therefore recommended discontinuing gabapentin and nortriptyline by PCP and initiated on amitriptyline.   She did have follow-up with ophthalmologist with concerns of occipital neuralgia.     ROS:   14 system review of systems performed and negative with exception of see HPI   Onset of tremor , left sided, sudden.    PMH:   Past Medical History:  Diagnosis Date   Anxiety    Arthritis    "back" (04/22/2018)   Back pain    Blood transfusion without reported diagnosis    CAD (coronary artery disease)    a. 03/2018 s/p PCI/DES to the RCA (3.0x15 Onyx DES).   Carotid artery stenosis    Mild   Chest pain    Chronic lower back pain    Cirrhosis (HCC)    Colon polyps    Diverticulitis    Diverticulosis    Esophageal thickening    seen on pre TAVR CT scan, also questionable cirrhosis. MRI recommended. Will refer to GI   Fatty liver    GERD (gastroesophageal reflux disease)    Grave's disease    History of colonic polyps 05/22/2017   History of hiatal hernia    Hypertension    Hypothyroidism  IBS (irritable bowel syndrome)    Osteopenia    Pulmonary nodules    seen on pre TAVR CT. likley benign. no follow up recommended if pt low risk.   S/P TAVR (transcatheter aortic valve replacement)    Severe aortic stenosis    Shortness of breath on exertion    Stroke (HCC)    Thalassemia minor    Thyroid disease    Type II diabetes mellitus (HCC)     PSH:  Past Surgical History:  Procedure Laterality Date   77 HOUR [place] STUDY N/A 03/03/2018   Procedure: 24 HOUR PH STUDY;  Surgeon: Mauri Pole, MD;  Location: [facility];  Service: Endoscopy;  Laterality: N/A;   AORTIC VALVE REPLACEMENT  06/2018   COLONOSCOPY     COLONOSCOPY W/ BIOPSIES AND POLYPECTOMY     CORONARY ANGIOGRAPHY Right 04/21/2018   Procedure: CORONARY ANGIOGRAPHY (CATH LAB);  Surgeon: [name], MD;  Location: [place] [facility];  Service: Cardiovascular;  Laterality: Right;   CORONARY STENT INTERVENTION N/A 04/22/2018   Procedure: CORONARY STENT INTERVENTION;  Surgeon: [name], MD;  Location: [place] [facility];  Service: Cardiovascular;  Laterality: N/A;   DILATION AND CURETTAGE OF UTERUS     ESOPHAGEAL MANOMETRY N/A 03/03/2018   Procedure: ESOPHAGEAL MANOMETRY (EM);  Surgeon: Mauri Pole, MD;  Location: [facility];  Service: Endoscopy;  Laterality: N/A;   GASTRIC FUNDOPLICATION     HERNIA REPAIR     HYSTEROSCOPY     fibroids   LAPAROSCOPIC CHOLECYSTECTOMY     LAPAROSCOPY     fibroids   NISSEN FUNDOPLICATION  6712W   POLYPECTOMY     RIGHT/LEFT HEART CATH AND CORONARY ANGIOGRAPHY N/A 02/20/2018   Procedure: RIGHT/LEFT HEART CATH AND CORONARY ANGIOGRAPHY;  Surgeon: [name], MD;  Location: [place] [facility];  Service: Cardiovascular;  Laterality: N/A;   TEE WITHOUT CARDIOVERSION N/A 07/08/2018   Procedure: TRANSESOPHAGEAL ECHOCARDIOGRAM (TEE);  Surgeon: Burnell Blanks, MD;  Location: [facility];  Service: Open Heart Surgery;  Laterality: N/A;   TEE WITHOUT CARDIOVERSION  10/07/2018   TEE WITHOUT CARDIOVERSION N/A 10/07/2018   Procedure: TRANSESOPHAGEAL ECHOCARDIOGRAM (TEE);  Surgeon: Jerline Pain, MD;  Location: [facility] ENDOSCOPY;  Service: Cardiovascular;  Laterality: N/A;   TONSILLECTOMY     TRANSCATHETER AORTIC VALVE REPLACEMENT, TRANSFEMORAL N/A 07/08/2018   Procedure: TRANSCATHETER AORTIC VALVE REPLACEMENT, TRANSFEMORAL;  Surgeon: Burnell Blanks, MD;  Location: [place];  Service: Open Heart Surgery;  Laterality: N/A;    Social History:  Social History   Socioeconomic History   Marital status: Married    Spouse name: Not on file   Number of children: 2   Years of education: Not on file   Highest education level: Not on file  Occupational History   Occupation: Tree surgeon of the [employer]  Tobacco Use   Smoking status: Never   Smokeless tobacco: Never  Vaping Use   Vaping Use: Never used  Substance and Sexual Activity   Alcohol use: No    Alcohol/week: 0.0 standard drinks of alcohol   Drug use: Never   Sexual activity:  Not Currently  Other Topics Concern   Not on file  Social History Narrative   Lives with husband in a one story home.     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   Education: college.   Social Determinants of Health   Financial  Resource Strain: Low Risk  (04/03/2021)   Overall Financial Resource Strain (CARDIA)    Difficulty of Paying Living Expenses: Not hard at all  Food Insecurity: No Food Insecurity (04/03/2021)   Hunger Vital Sign    Worried About Running Out of Food in the Last Year: Never true    Ran Out of Food in the Last Year: Never true  Transportation Needs: No Transportation Needs (04/03/2021)   PRAPARE - Hydrologist (Medical): No    Lack of Transportation (Non-Medical): No  Physical Activity: Inactive (04/03/2021)   Exercise Vital Sign    Days of Exercise per Week: 0 days    Minutes of Exercise per Session: 0 min  Stress: No Stress Concern Present (04/03/2021)   [place]    Feeling of Stress : Not at all  Social Connections: [place] (04/03/2021)   Social Connection and Isolation Panel [NHANES]    Frequency of Communication with Friends and Family: More than three times a week    Frequency of Social Gatherings with Friends and Family: More than three times a week    Attends Religious Services: More than 4 times per year    Active Member of [employer] or Organizations: Yes    Attends Music therapist: More than 4 times per year    Marital Status: Married  Human resources officer Violence: Not At Risk (04/03/2021)   Humiliation, Afraid, Rape, and Kick questionnaire    Fear of Current or Ex-Partner: No    Emotionally Abused: No    Physically Abused: No    Sexually Abused: No    Family History:  Family History  Adopted: Yes  Problem Relation Age of Onset   Healthy Son        x 2    Headache Other        Cluster headaches   Heart failure Mother    Colon cancer Neg Hx    Pancreatic cancer Neg Hx    Rectal cancer Neg Hx    Stomach cancer Neg Hx     Medications:   Current Outpatient Medications on File Prior to Visit  Medication Sig Dispense Refill   apixaban (ELIQUIS) 5 MG TABS tablet Take 1 tablet (5 mg total) by mouth 2 (two) times daily. 180 tablet 3   b complex vitamins capsule Take 1 capsule by mouth daily.     BD PEN NEEDLE NANO 2ND GEN 32G X 4 MM MISC      carbidopa-levodopa (SINEMET) 25-250 MG tablet Take 1 tablet by mouth 3 (three) times daily. 90 tablet 5   cholecalciferol (VITAMIN D) 25 MCG (1000 UNIT) tablet Take 1,000 Units by mouth daily.     diltiazem (CARDIZEM CD) 120 MG 24 hr capsule Take 1 capsule (120 mg total) by mouth daily. 90 capsule 1   escitalopram (LEXAPRO) 20 MG tablet TAKE ONE TABLET BY MOUTH DAILY 90 tablet 1   estradiol (ESTRACE) 0.1 MG/GM vaginal cream Place vaginally.     Evolocumab (REPATHA SURECLICK) 299 MG/ML SOAJ Inject 1 dose into the skin every 14 (fourteen) days. 2 mL 11   furosemide (LASIX) 40 MG tablet Take 1 tablet (40 mg total) by mouth daily. 10 tablet 0   glucose blood (ONETOUCH VERIO) test strip USE TO CHECK BLOOD SUGAR THREE TIMES DAILY 300 strip 4   Insulin Pen Needle (UNIFINE PENTIPS) 32G X 4 MM MISC Inject 4 times subcutaneously 400 each 2  Insulin Syringe-Needle U-100 (ULTICARE INSULIN SYRINGE) 31G X 5/16" 1 ML MISC Inject 4 times daily subcutaneously 100 each 1   Lancets (ONETOUCH DELICA PLUS FUXNAT55D) MISC Use twice daily 200 each 1   levothyroxine (SYNTHROID) 125 MCG tablet Take 1 tablet by mouth in the morning on an empty stomach once a day. 90 days 90 tablet 0   losartan (COZAAR) 50 MG tablet TAKE ONE TABLET BY MOUTH DAILY 90 tablet 1   metFORMIN (GLUCOPHAGE-XR) 500 MG 24 hr tablet Take 1 tablet by mouth 2 times daily 180 tablet 2   Multiple Vitamin (MULITIVITAMIN WITH MINERALS) TABS Take 1 tablet by mouth  daily.     nitroGLYCERIN (NITROSTAT) 0.4 MG SL tablet Place 1 tablet (0.4 mg total) under the tongue every 5 (five) minutes as needed for chest pain. 25 tablet 3   NOVOLIN N 100 UNIT/ML injection Inject 28 Units into the skin daily before breakfast.     NOVOLIN R 100 UNIT/ML injection 8 Units 2 (two) times daily before a meal. 10 UNITS THIRTY MINUTES BEFORE MEALS.  5 ADDITIONAL UNITS WITH CARBS OR SNACKS. (04/26/2021:  Pt reports she only takes the 8 units 2x/daily before meals, not the 10 units)     omeprazole (PRILOSEC) 20 MG capsule Take 1 capsule (20 mg total) by mouth 2 (two) times daily before a meal. 60 capsule 3   Vibegron (GEMTESA) 75 MG TABS 1 tablet PO QD 30 tablet 0   No current facility-administered medications on file prior to visit.    Allergies:   Allergies  Allergen Reactions   Statins Other (See Comments)    Muscle aches     Physical Exam  Vitals:   10/25/21 1305  BP: 136/62  Pulse: 81  Weight: 178 lb 8 oz (81 kg)  Height: 5' 5"  (1.651 m)   Body mass index is 29.7 kg/m. No results found.  General: well developed, well nourished, pleasant elderly Caucasian female, seated, mildly anxious Head: head normocephalic and atraumatic.   Neck: supple with no carotid or supraclavicular bruits Cardiovascular: regular rate and rhythm, no murmurs Musculoskeletal: no deformity; pain with pressure over left lateral rib area Skin:  no rash/petichiae; ecchymosis left orbital and flank area Vascular:  Normal pulses all extremities   Neurologic Exam Mental Status: Awake and fully alert. Fluent speech and language. Oriented to place and time. Recent memory mildly impaired and remote memory intact.  Attention span, concentration and fund of knowledge mostly appropriate. Mood and affect appropriate.  Cranial Nerves: Pupils equal, briskly reactive to light. Extraocular movements full without nystagmus although mild OS convergence insufficiency.  Visual fields full to confrontation.  Hearing intact. Facial sensation intact.  Mild left lower facial weakness - chronic  Motor: Normal bulk and tone.  Full strength in all tested extremities except slight decreased left hand dexterity and mild tremor with outstretched arm Sensory.: intact to touch, pinprick , position and vibratory sensation.  Coordination: Rapid alternating movements normal in all extremities except tremor and coordination decreased left hand. Finger-to-nose showed slight left hand ataxia . Gait and Station: Arises from chair with mild difficulty. Stance is normal. Gait demonstrates slow cautious steps with mild imbalance and generalized mild swaying from side to side.  Tandem walk and heel toe not attempted.  Romberg negative. Reflexes: 1+ and symmetric. Toes downgoing.   ASSESSMENT: Breanna Webster is a 76 y.o. 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  She was not  able to tolerate  Sinemet 25/ 200 tid, bid and neither 25/250 CR. She became too nauseated.  She has been in OT, PT and we discussed today 0.25 mg Requip tid. I can encourage her to continue PT/ OT.    PLAN:  Vascular parkinsonism.   Follow-up in 5-6 months or call earlier if needed-   CC:  [facility] provider: Dr. Leonie Man,  [name], NP    I spent a prolonged >30 min of face-to-face and non-face-to-face time with patient and husband.    This included previsit chart review including review of recent ED visit, lab review, study review, order entry, electronic health record documentation, patient and husband discussion and education regarding prior history of stroke and etiology, residual deficits, worsening imbalance and falls, left sided pain post fall,  and answered all other related questions, importance of managing stroke risk factors, and answered all other multiple questions to patient and husband satisfaction  Larey Seat, MD   [facility] [address] [place], [place] [address]  Phone [phone] Fax [phone] Note: This document was prepared with digital dictation and possible smart phrase technology. Any transcriptional errors that result from this process are unintentional.

## 2021-10-29 ENCOUNTER — Ambulatory Visit (HOSPITAL_COMMUNITY): Payer: Medicare Other

## 2021-10-30 ENCOUNTER — Ambulatory Visit: Payer: Medicare Other | Attending: Adult Health | Admitting: Physical Therapy

## 2021-10-30 ENCOUNTER — Encounter: Payer: Self-pay | Admitting: Hematology

## 2021-10-30 ENCOUNTER — Encounter: Payer: Self-pay | Admitting: Adult Health

## 2021-10-30 ENCOUNTER — Other Ambulatory Visit (HOSPITAL_BASED_OUTPATIENT_CLINIC_OR_DEPARTMENT_OTHER): Payer: Self-pay

## 2021-10-30 DIAGNOSIS — M25561 Pain in right knee: Secondary | ICD-10-CM | POA: Insufficient documentation

## 2021-10-30 DIAGNOSIS — G8929 Other chronic pain: Secondary | ICD-10-CM | POA: Diagnosis not present

## 2021-10-30 DIAGNOSIS — E1169 Type 2 diabetes mellitus with other specified complication: Secondary | ICD-10-CM

## 2021-10-30 DIAGNOSIS — R2689 Other abnormalities of gait and mobility: Secondary | ICD-10-CM | POA: Diagnosis not present

## 2021-10-30 DIAGNOSIS — M6281 Muscle weakness (generalized): Secondary | ICD-10-CM | POA: Insufficient documentation

## 2021-10-30 DIAGNOSIS — R2681 Unsteadiness on feet: Secondary | ICD-10-CM | POA: Diagnosis not present

## 2021-10-30 MED ORDER — INSULIN REGULAR HUMAN 100 UNIT/ML IJ SOLN
10.0000 [IU] | Freq: Three times a day (TID) | INTRAMUSCULAR | 0 refills | Status: DC
  Filled 2021-10-30: qty 10, 34d supply, fill #0

## 2021-10-30 MED ORDER — HUMULIN N KWIKPEN 100 UNIT/ML ~~LOC~~ SUPN
28.0000 [IU] | PEN_INJECTOR | Freq: Every day | SUBCUTANEOUS | 0 refills | Status: DC
  Filled 2021-10-30: qty 15, 30d supply, fill #0

## 2021-10-30 NOTE — Telephone Encounter (Signed)
Patient's husband came into the office today requesting refills for patient's Humalin N and Humalin R.  It was prescribed by Dr Madelin Rear who has moved.  The patient was also seen by Dr Loanne Drilling who is no longer with the practice.  I explained to the husband that any future refills should come from endocrinology.  1 refill sent per Dr Jerilee Hoh.  Would you like a referral placed with ENDO?

## 2021-10-31 ENCOUNTER — Other Ambulatory Visit (HOSPITAL_BASED_OUTPATIENT_CLINIC_OR_DEPARTMENT_OTHER): Payer: Self-pay

## 2021-10-31 ENCOUNTER — Encounter: Payer: Self-pay | Admitting: Physical Therapy

## 2021-10-31 NOTE — Therapy (Signed)
[facility] [address] [place], [place], [address] Phone: [phone]   Fax:  [phone]  Physical Therapy Treatment  Patient Details  Name: Breanna Webster MRN: [id number] Date of Birth: 1945-07-08 Referring Provider (PT): Dohmeier, Asencion Partridge   Encounter Date: 10/30/2021   PT End of Session - 10/31/21 1737     Visit Number 24    Number of Visits 29    Date for PT Re-Evaluation 12/04/21    Authorization Type Medicare/Generic Commercial    Authorization Time Period 08-07-21 - 09-20-21; 10-30-21 - 12-31-21    Progress Note Due on Visit 20    PT Start Time 1403    PT Stop Time 8937    PT Time Calculation (min) 42 min    Equipment Utilized During Treatment Other (comment)   bar bells, pool noodle   Activity Tolerance Patient tolerated treatment well    Behavior During Therapy [facility] for tasks assessed/performed             Past Medical History:  Diagnosis Date   Anxiety    Arthritis    "back" (04/22/2018)   Back pain    Blood transfusion without reported diagnosis    CAD (coronary artery disease)    a. 03/2018 s/p PCI/DES to the RCA (3.0x15 Onyx DES).   Carotid artery stenosis    Mild   Chest pain    Chronic lower back pain    Cirrhosis (HCC)    Colon polyps    Diverticulitis    Diverticulosis    Esophageal thickening    seen on pre TAVR CT scan, also questionable cirrhosis. MRI recommended. Will refer to GI   Fatty liver    GERD (gastroesophageal reflux disease)    Grave's disease    History of colonic polyps 05/22/2017   History of hiatal hernia    Hypertension    Hypothyroidism    IBS (irritable bowel syndrome)    Osteopenia    Pulmonary nodules    seen on pre TAVR CT. likley benign. no follow up recommended if pt low risk.   S/P TAVR (transcatheter aortic valve replacement)    Severe aortic stenosis    Shortness of breath on exertion    Stroke ([place])    Thalassemia minor    Thyroid disease     Type II diabetes mellitus ([place])     Past Surgical History:  Procedure Laterality Date   52 HOUR [place] STUDY N/A 03/03/2018   Procedure: 24 HOUR PH STUDY;  Surgeon: Mauri Pole, MD;  Location: [facility];  Service: Endoscopy;  Laterality: N/A;   AORTIC VALVE REPLACEMENT  06/2018   COLONOSCOPY     COLONOSCOPY W/ BIOPSIES AND POLYPECTOMY     CORONARY ANGIOGRAPHY Right 04/21/2018   Procedure: CORONARY ANGIOGRAPHY (CATH LAB);  Surgeon: [name], MD;  Location: [facility];  Service: Cardiovascular;  Laterality: Right;   CORONARY STENT INTERVENTION N/A 04/22/2018   Procedure: CORONARY STENT INTERVENTION;  Surgeon: [name], MD;  Location: [facility];  Service: Cardiovascular;  Laterality: N/A;   DILATION AND CURETTAGE OF UTERUS     ESOPHAGEAL MANOMETRY N/A 03/03/2018   Procedure: ESOPHAGEAL MANOMETRY (EM);  Surgeon: Mauri Pole, MD;  Location: [facility];  Service: Endoscopy;  Laterality: N/A;   GASTRIC FUNDOPLICATION     HERNIA REPAIR     HYSTEROSCOPY     fibroids   LAPAROSCOPIC CHOLECYSTECTOMY     LAPAROSCOPY  fibroids   NISSEN FUNDOPLICATION  5631S   POLYPECTOMY     RIGHT/LEFT HEART CATH AND CORONARY ANGIOGRAPHY N/A 02/20/2018   Procedure: RIGHT/LEFT HEART CATH AND CORONARY ANGIOGRAPHY;  Surgeon: [name], MD;  Location: [facility];  Service: Cardiovascular;  Laterality: N/A;   TEE WITHOUT CARDIOVERSION N/A 07/08/2018   Procedure: TRANSESOPHAGEAL ECHOCARDIOGRAM (TEE);  Surgeon: Burnell Blanks, MD;  Location: [facility];  Service: Open Heart Surgery;  Laterality: N/A;   TEE WITHOUT CARDIOVERSION  10/07/2018   TEE WITHOUT CARDIOVERSION N/A 10/07/2018   Procedure: TRANSESOPHAGEAL ECHOCARDIOGRAM (TEE);  Surgeon: Jerline Pain, MD;  Location: [facility] ENDOSCOPY;  Service: Cardiovascular;  Laterality: N/A;   TONSILLECTOMY     TRANSCATHETER AORTIC VALVE REPLACEMENT, TRANSFEMORAL N/A 07/08/2018   Procedure: TRANSCATHETER AORTIC VALVE  REPLACEMENT, TRANSFEMORAL;  Surgeon: Burnell Blanks, MD;  Location: [facility];  Service: Open Heart Surgery;  Laterality: N/A;    There were no vitals filed for this visit.   Subjective Assessment - 10/31/21 1732     Subjective Pt reports she received the injections in her Rt knee but says they did not help - thinks she will have TKR surgery by end of this year; states she is in alot of pain when she is standing or walking - rates intensity 8/10 with weight bearing; states the "pool is the only place I don't have pain" (with weight bearing); pt is scheduled to go on a cruise in August - wants to try to improve mobility as much as possible from now until time of the cruise    Pertinent History significant arthritis in knees    Patient Stated Goals Pt's goals for therapy are to get better with walking and with arthritis.    Currently in Pain? Yes    Pain Score 8     Pain Location Knee    Pain Orientation Right    Pain Descriptors / Indicators Aching;Grimacing;Nagging;Sore    Pain Type Chronic pain    Pain Onset More than a month ago    Pain Frequency Intermittent    Aggravating Factors  weight bearing    Pain Relieving Factors aquatic exercise; non-weightbearing positions                   Aquatic therapy at [facility] - pool temp 90 degrees  Patient seen for aquatic therapy today.  Treatment took place in water 3.5-4.5 feet deep depending upon activity.  Pt entered & exited the pool  the pool via step negotiation using step by step sequence for descension and ascension with use of hand rails with supervision; cues needed for correct sequence to minimize pain in Rt knee  Pt performed water walking 18' x approx. 8 reps forwards across pool, using pool noodle for increased floatation for unweighting RLE for reduced Rt knee pain; 18' x 6 reps with UE support on large yellow noodle;  18' x 4 reps backwards across width of pool with UE support on noodle  Pt  performed standing bil. Hip exercises - flexion, abduction and extension 10 reps each leg  Pt performed marching in place 10 reps with bil. UE support on pool edge prn;  marching forwards 18' x 2 reps with use of bar bells   Supine exercises - bicycling LE's approx. 20 reps; hip abduction/adduction bil. LE's 20 reps  Pt performed squats at edge of pool 10 reps x 2 sets  Pt performed water walking (forward) 18' x 2 reps with no  UE support at end of session - 18' x 2 reps with cues to amb. With large step length and to increase arm swing  Pt requires buoyancy of water for support and safety with standing balance exercises; exercises are able to be safely attempted/performed in water without or with minimal UE support that cannot be performed safely on land.  Viscosity of water is needed for resistance for strengthening.  Current of water provides perturbations for challenges with standing balance.  Buoyancy of water also provides joint offloading which increases comfort and reduces pain in knees with weight bearing activities and exs in water, whereas, this is unable to be achieved with weight bearing exercises on land.                                PT Short Term Goals - 09/12/21 1045       PT SHORT TERM GOAL #1   Title Pt will be independent with HEP for improved strength and flexibility, balance, transfers, and gait.   TARGET 05/26/2021    Time 4    Period Weeks    Status Achieved      PT SHORT TERM GOAL #2   Title Pt will improve Berg score to at least 41/56 to decrease fall risk.    Baseline 36/56    Time 4    Period Weeks    Status Achieved      PT SHORT TERM GOAL #3   Title Pt will improve TUG score to less than or equal to 21 sec for decreased fall risk.    Baseline 26.88 sec    Time 4    Period Weeks    Status Achieved      PT SHORT TERM GOAL #4   Title Pt will verbalize understanding of fall prevention in home environment.    Time 4    Period  Weeks    Status Achieved               PT Long Term Goals - 10/31/21 1740       PT LONG TERM GOAL #1   Title Pt will be independent with HEP for improved flexibility and strength, balance, transfers, and gait.  TARGET 06/23/2021    Time 5    Period Weeks    Status Achieved    Target Date 07/18/21      PT LONG TERM GOAL #2   Title Pt will improve Berg score to at least 46/56 to decrease fall risk.    Time 5    Period Weeks    Status Achieved   47/56   Target Date 07/18/21      PT LONG TERM GOAL #3   Title Pt will improve TUG score to less than or equal to 16 sec for decreased fall risk.    Baseline Not assessed - pt has been receiving aquatic therapy only for past 6 weeks    Time 5    Period Weeks    Status Deferred   18.43 sec   Target Date 07/18/21      PT LONG TERM GOAL #4   Title Pt will improve gait velocity to at least 2 ft/sec for improved gait efficiency and safety.    Baseline 1.73 ft/sec    Time 9    Period Weeks    Status Achieved   2.69 ft/s     PT LONG TERM GOAL #5  Title Pt will verbalize plans for continued community fitness upon d/c to maximize gains made in PT.    Time 9    Period Weeks    Status Achieved   reports plans to join gym and walk at [place] #6   Title Pt will be independent in aquatic exercise program to be continued upon D/C from PT.    Baseline pt is doing land exercises only - is too fearful to do pool exercises independently and does not have access to a pool    Time 6    Period Weeks    Status On-going    Target Date 12/04/21      PT LONG TERM GOAL #7   Title Pt will report Rt knee pain intensity </= 6/10 with weight bearing.    Baseline 8/10 reported on 10-31-21    Time 6    Period Weeks    Status New    Target Date 12/04/21      PT LONG TERM GOAL #8   Title Pt will negotiate steps in pool with use of handrail using step by step sequence independently with no cues for correct sequence for Rt  knee pain management (down with RLE, up with LLE).    Baseline cues needed for correct sequence in order to minimize Rt knee pain - 10-31-21    Time 4    Period Weeks    Status New    Target Date 12/04/21                   Plan - 10/31/21 1754     Clinical Impression Statement Pt returns to aquatic PT after being placed on hold since end of May 2023; pt has plans to go on a cruise in August and states she wants to improve mobility as much as possible prior to this planned trip.  Pt reports increased Rt knee pain at intensity 8/10 with weight bearing; pt states she received the gel injections but they did not help in providing relief of knee pain with standing/walking and states she will probably have to have a TKR by end of the year.  Pt used yellow noodle for increased flotation for joint offloading for reduced pain with weight bearing.  Session focused on RLE strengthening and knee AROM with focus on water walking for increased endurance as this activity cannot be easily performed on land due to c/o Rt knee pain.  Pt has antalgic gait pattern and is currently using a SPC for assistance with ambulation.  Cont aquatic PT for 5 additional sessions.    Comorbidities CVA due to embolism R PCA; COVID -19, post-viral fatigue syndrome  PMH COVID 11/2020, arthritis, back pain, CAD, cirrhosis, diverticulitis/osis, GERD, HTN, IBS, osteopenia, PAF    PT Frequency 1x / week    PT Duration 6 weeks    PT Treatment/Interventions Aquatic Therapy    PT Next Visit Plan LE strengthening and water walking - aquatic PT only per pt's request    Consulted and Agree with Plan of Care Patient             Patient will benefit from skilled therapeutic intervention in order to improve the following deficits and impairments:  Abnormal gait, Difficulty walking, Impaired flexibility, Decreased balance, Decreased mobility, Decreased strength, Postural dysfunction, Pain  Visit Diagnosis: Chronic pain of right  knee  Other abnormalities of gait and mobility  Muscle weakness (generalized)  Unsteadiness on feet  Problem List Patient Active Problem List   Diagnosis Date Noted   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[place]) 10/25/2021   Left hemiparesis ([place]) 06/21/2021   Other specified hereditary hemolytic anemias ([place]) 06/21/2021   Parkinsonism ([place]) 06/21/2021   COVID-19 02/14/2021   Postviral fatigue syndrome 02/14/2021   Difficulty with adaptive servo-ventilation (ASV) use 02/14/2021   Sepsis secondary to UTI ([place]) 09/08/2020   Elevated ALT measurement 09/08/2020   History of CVA with residual deficit 09/08/2020   Hyperbilirubinemia 09/08/2020   Fatigue associated with anemia 08/03/2020   Cerebrovascular accident (CVA) due to embolism of right posterior cerebral artery ([place]) 08/03/2020   OSA treated with BiPAP 08/03/2020   Complex sleep apnea syndrome 08/03/2020   Treatment-emergent central sleep apnea 08/03/2020   Chronic intermittent hypoxia with obstructive sleep apnea 04/21/2020   OSA (obstructive sleep apnea) 04/21/2020   History of cardioembolic stroke 23/53/6144   Gait disturbance, post-stroke 03/29/2020   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 ([place]) 03/29/2020   Anxiety    RLQ abdominal pain 10/22/2019   Paroxysmal atrial fibrillation ([place]) 05/22/2019   Iron deficiency anemia 05/07/2019   Atrial fibrillation with RVR ([place]) 10/21/2018   Cerebellar stroke, acute ([place]) 10/21/2018   Streptococcal endocarditis    Endocarditis of mitral valve 10/07/2018   Bacteremia due to Streptococcus Salivarius 10/07/2018   Sepsis ([place]) 31/54/0086   Acute metabolic encephalopathy 76/19/5093   Severe aortic stenosis 07/08/2018   S/P TAVR (transcatheter aortic valve replacement) 07/08/2018   Esophageal thickening    CAD in native artery 04/22/2018   Gastroesophageal reflux disease    Pulmonary hypertension ([place]) 02/21/2018   Essential hypertension 07/15/2017   History  of colonic polyps 05/22/2017   Elevated liver function tests 12/05/2016   DOE (dyspnea on exertion) 07/19/2016   Thalassemia minor 05/29/2016   Left bundle branch block 12/06/2015   Upper airway cough syndrome 26/71/2458   Diastolic heart failure ([place]) 10/10/2015   Myalgia 02/17/2014   Carotid artery stenosis 06/05/2013   Eustachian tube dysfunction 05/07/2013   Neuropathy of leg 03/07/2012   Anemia, unspecified 01/31/2012   Hot flashes 08/24/2010   Diabetes mellitus due to underlying condition, uncontrolled 06/30/2010   Generalized abdominal pain 03/24/2010   Obesity (BMI 30.0-34.9) 12/21/2009   IBS (irritable bowel syndrome) 04/29/2009   Vitamin D deficiency 03/10/2009   Acute bronchitis 03/07/2009   Hypothyroidism 12/13/2008   Dyslipidemia 12/13/2008   Anxiety state 12/13/2008   Other specified disorders of bladder 12/13/2008    Alda Lea, PT 10/31/2021, 6:06 PM  [facility] [address] [place], [place], [address] Phone: [phone]   Fax:  [phone]  Name: Breanna Webster MRN: [id number] Date of Birth: December 01, 1945

## 2021-11-01 NOTE — Telephone Encounter (Signed)
Order has been placed for endo referral to [place]

## 2021-11-03 ENCOUNTER — Telehealth: Payer: Self-pay | Admitting: Pharmacist

## 2021-11-03 NOTE — Chronic Care Management (AMB) (Signed)
    Chronic Care Management Pharmacy Assistant   Name: Breanna Webster  MRN: [id number] DOB: 1945/06/21  11/06/2021 APPOINTMENT REMINDER  Breanna Webster 's husband was reminded to have all medications, supplements and any blood glucose and blood pressure readings available for review with Breanna Webster, Pharm. D, at her telephone visit on 11/06/2021 at 1:30.  Care Gaps: AWV - scheduled 04/05/2022 Last BP - 136/62 on 10/25/2021 Last A1C - 7.4 on 09/08/2020 Foot exam - overdue HGA1C - overdue Covid booster - overdue Eye exam - overdue  Star Rating Drug: Losartan 50 mg - last filled 08/04/2021 90 DS at [facility] Metformin 500 mg - last filled 10/02/2021 90 DS at [facility]  Any gaps in medications fill history? No  Breanna Webster [facility]  Catering manager [phone]

## 2021-11-06 ENCOUNTER — Ambulatory Visit (INDEPENDENT_AMBULATORY_CARE_PROVIDER_SITE_OTHER): Payer: Medicare Other | Admitting: Pharmacist

## 2021-11-06 ENCOUNTER — Ambulatory Visit: Payer: Medicare Other | Admitting: Physical Therapy

## 2021-11-06 DIAGNOSIS — I10 Essential (primary) hypertension: Secondary | ICD-10-CM

## 2021-11-06 DIAGNOSIS — E1169 Type 2 diabetes mellitus with other specified complication: Secondary | ICD-10-CM

## 2021-11-06 NOTE — Progress Notes (Unsigned)
Chronic Care Management Pharmacy Note  11/13/2021 Name:  Breanna Webster MRN:  [id number] DOB:  14-Mar-1946  Summary: A1c is at goal < 7% Pt inquired about starting Ozempic Pt stopped omeprazole on her own  Recommendations/Changes made from today's visit: -Recommended discussing with endo about starting a CGM and Ozempic -Inquired about coverage/price of Ozempic and CGM monitor  -Recommended restarting omeprazole at least once daily  Plan: Follow up BP/DM assessment in 2 months Follow up in 4 months   Subjective: Breanna Webster is an 76 y.o. year old female who is a primary patient of [name], NP.  The CCM team was consulted for assistance with disease management and care coordination needs.    Engaged with patient by telephone for follow up visit in response to provider referral for pharmacy case management and/or care coordination services.   Consent to Services:  The patient was given information about Chronic Care Management services, agreed to services, and gave verbal consent prior to initiation of services.  Please see initial visit note for detailed documentation.   Patient Care Team: [name], NP as PCP - General (Family Medicine) [name], MD as PCP - Cardiology (Cardiology) Madelin Rear, MD (Inactive) as Consulting Physician (Endocrinology) [name], RN as Case Manager Garvin Fila, MD as Referring Physician (Neurology) Viona Gilmore, [facility] as Pharmacist (Pharmacist)  Recent office visits: 10/12/21 [name], NP: Patient presented for annual exam. Prescribed furosemide 40 mg daily x 3 days.  06/29/21 [name], NP - Patient presented for Lower extremity edema and other concerns. No medication changes.  Recent consult visits: 10/30/21 [name], PT (outpatient rehab): Patient presented for PT treatment for right knee.  10/25/21 Larey Seat, MD (neurology): Patient presented for stroke follow up. Patient did  not tolerate Sinemet and switching to Requip 0.25 mg TID. Follow up in 5-6 months.  09/06/21 Fabienne Bruns, MD (hem/onc): Patient presented for iron deficiency anemia. Follow up in 12 months.   09/04/21 Madelin Rear, MD [facility] - Patient presented for Type 2 diabetes mellitus with other diabetic neuro complication and other concerns. No medication changes. Unable to access notes.  08/18/21 [place] Cellar, MD [name]): Patient presented for diarrhea. Trial of Citrucel once daily. Follow up in June for ultrasound.  08/14/21 [name], NP (neurology): Patient presented for stroke follow up. Recommended restarting OT and continuing PT.  07/19/21 Patient presented to [facility] for Carotid.   06/27/21 Tanda Rockers, MD (Pulmonology) - Patient presented for Upper airway cough syndrome. Prescribed Acetaminophen Codeine. Stopped Amoxicillin   06/21/21 Dohmeier, Asencion Partridge, MD (Neurology) - Patient presented for Parkinsonism unspecified and other concerns. Prescribed Carbidopa - Levodopa.    Hospital visits: None in previous 6 months.   Objective:  Lab Results  Component Value Date   CREATININE 0.73 09/22/2021   BUN 15 09/22/2021   GFR 79.08 09/22/2021   GFRNONAA >60 09/06/2021   GFRAA 99 12/05/2020   NA 136 09/22/2021   K 4.0 09/22/2021   CALCIUM 10.5 09/22/2021   CO2 28 09/22/2021   GLUCOSE 187 (H) 09/22/2021    Lab Results  Component Value Date/Time   HGBA1C 7.4 (H) 09/08/2020 04:16 AM   HGBA1C 6.5 05/05/2020 12:00 AM   HGBA1C 7.5 (H) 10/22/2018 04:45 AM   FRUCTOSAMINE 373 (H) 03/08/2016 11:43 AM   GFR 79.08 09/22/2021 10:56 AM   GFR 75.63 03/13/2021 11:54 AM   MICROALBUR 0.8 10/07/2015 01:30 PM   MICROALBUR 1.1  02/08/2014 02:49 PM    Last diabetic Eye exam:  Lab Results  Component Value Date/Time   HMDIABEYEEXA Retinopathy (A) 08/29/2020 12:00 AM    Last diabetic Foot exam: No results found for: "HMDIABFOOTEX"   Lab  Results  Component Value Date   CHOL 106 09/06/2021   HDL 50 09/06/2021   LDLCALC 36 09/06/2021   LDLDIRECT 98.0 05/24/2014   TRIG 101 09/06/2021   CHOLHDL 2.1 09/06/2021       Latest Ref Rng & Units 09/22/2021   10:56 AM 09/06/2021   10:05 AM 04/05/2021    3:55 PM  Hepatic Function  Total Protein 6.0 - 8.3 g/dL 7.2  7.1  6.6   Albumin 3.5 - 5.2 g/dL 4.1  4.1  3.9   AST 0 - 37 U/L 28  27  23    ALT 0 - 35 U/L 14  31  28    Alk Phosphatase 39 - 117 U/L 93  96  52   Total Bilirubin 0.2 - 1.2 mg/dL 1.0  0.9  0.8     Lab Results  Component Value Date/Time   TSH 1.44 12/05/2020 12:00 AM   TSH 1.47 05/05/2020 12:00 AM   TSH 5.935 (H) 04/30/2019 11:49 AM   TSH 9.460 (H) 11/12/2018 09:39 AM   TSH 0.322 (L) 10/21/2018 03:30 PM   TSH 0.79 08/05/2017 11:52 AM   FREET4 0.89 04/30/2019 11:49 AM   FREET4 1.78 (H) 10/21/2018 03:30 PM       Latest Ref Rng & Units 09/22/2021   10:56 AM 09/06/2021   10:05 AM 04/05/2021    3:55 PM  CBC  WBC 4.0 - 10.5 K/uL 6.8  6.9  7.3   Hemoglobin 12.0 - 15.0 g/dL 10.9  11.0  10.0   Hematocrit 36.0 - 46.0 % 34.9  34.9  32.7   Platelets 150.0 - 400.0 K/uL 173.0  179  155     Lab Results  Component Value Date/Time   VD25OH 34.2 02/26/2017 11:16 AM   VD25OH 28.0 (L) 11/21/2016 11:13 AM   VD25OH 20.86 (L) 05/29/2016 11:16 AM    Clinical ASCVD: Yes  The ASCVD Risk score (Arnett DK, et al., 2019) failed to calculate for the following reasons:   The patient has a prior MI or stroke diagnosis       04/03/2021    1:29 PM 10/03/2020    1:18 PM 03/14/2020   11:39 AM  Depression screen PHQ 2/9  Decreased Interest 0 0 0  Down, Depressed, Hopeless 0 0 0  PHQ - 2 Score 0 0 0  Altered sleeping   0  Tired, decreased energy   0  Change in appetite   0  Feeling bad or failure about yourself    0  Trouble concentrating   0  Moving slowly or fidgety/restless   0  Suicidal thoughts   0  PHQ-9 Score   0  Difficult doing work/chores   Not difficult at all    CHA2DS2/VAS Stroke Risk Points  Current as of 15 minutes ago     9 >= 2 Points: High Risk  1 - 1.99 Points: Medium Risk  0 Points: Low Risk    Last Change: N/A      Details    This score determines the patient's risk of having a stroke if the  patient has atrial fibrillation.       Points Metrics  1 Has Congestive Heart Failure:  Yes    Current as of 15  minutes ago  1 Has Vascular Disease:  Yes    Current as of 15 minutes ago  1 Has Hypertension:  Yes    Current as of 15 minutes ago  2 Age:  30    Current as of 15 minutes ago  1 Has Diabetes:  Yes    Current as of 15 minutes ago  2 Had Stroke:  Yes  Had TIA:  No  Had Thromboembolism:  No    Current as of 15 minutes ago  1 Female:  Yes    Current as of 15 minutes ago            Social History   Tobacco Use  Smoking Status Never  Smokeless Tobacco Never   BP Readings from Last 3 Encounters:  11/07/21 126/70  10/25/21 136/62  10/12/21 120/68   Pulse Readings from Last 3 Encounters:  11/07/21 87  10/25/21 81  10/12/21 80   Wt Readings from Last 3 Encounters:  11/07/21 179 lb (81.2 kg)  10/25/21 178 lb 8 oz (81 kg)  10/12/21 179 lb (81.2 kg)   BMI Readings from Last 3 Encounters:  11/07/21 29.79 kg/m  10/25/21 29.70 kg/m  10/12/21 29.79 kg/m    Assessment/Interventions: Review of patient past medical history, allergies, medications, health status, including review of consultants reports, laboratory and other test data, was performed as part of comprehensive evaluation and provision of chronic care management services.   SDOH:  (Social Determinants of Health) assessments and interventions performed: Yes  SDOH Interventions    Flowsheet Row Most Recent Value  SDOH Interventions   Financial Strain Interventions Other (Comment)  [working on coverage options for GLP1]       SDOH Screenings   Alcohol Screen: Low Risk  (03/14/2020)   Alcohol Screen    Last Alcohol Screening Score (AUDIT): 0   Depression (PHQ2-9): Low Risk  (04/03/2021)   Depression (PHQ2-9)    PHQ-2 Score: 0  Financial Resource Strain: Medium Risk (11/13/2021)   Overall Financial Resource Strain (CARDIA)    Difficulty of Paying Living Expenses: Somewhat hard  Food Insecurity: No Food Insecurity (04/03/2021)   Hunger Vital Sign    Worried About Running Out of Food in the Last Year: Never true    Ran Out of Food in the Last Year: Never true  Housing: Low Risk  (04/03/2021)   Housing    Last Housing Risk Score: 0  Physical Activity: Inactive (04/03/2021)   Exercise Vital Sign    Days of Exercise per Week: 0 days    Minutes of Exercise per Session: 0 min  Social Connections: Socially Integrated (04/03/2021)   Social Connection and Isolation Panel [NHANES]    Frequency of Communication with Friends and Family: More than three times a week    Frequency of Social Gatherings with Friends and Family: More than three times a week    Attends Religious Services: More than 4 times per year    Active Member of [employer] or Organizations: Yes    Attends Archivist Meetings: More than 4 times per year    Marital Status: Married  Stress: No Stress Concern Present (04/03/2021)   [place] of Stress : Not at all  Tobacco Use: Low Risk  (11/07/2021)   Patient History    Smoking Tobacco Use: Never    Smokeless Tobacco Use: Never    Passive Exposure: Not on file  Transportation Needs: No Transportation  Needs (04/03/2021)   PRAPARE - Hydrologist (Medical): No    Lack of Transportation (Non-Medical): No    CCM Care Plan  Allergies  Allergen Reactions   Statins Other (See Comments)    Muscle aches    Medications Reviewed Today     Reviewed by [name], [name], MD (Physician) on 11/07/21 at 1442  Med List Status: <None>   Medication Order Taking? Sig Documenting Provider Last Dose Status Informant   apixaban (ELIQUIS) 5 MG TABS tablet [id number] Yes Take 1 tablet (5 mg total) by mouth 2 (two) times daily. [name], NP Taking Active   b complex vitamins capsule [id number] Yes Take 1 capsule by mouth daily. [provider] Taking Active   BD PEN NEEDLE NANO 2ND GEN 32G X 4 MM MISC [id number] Yes  [provider] Taking Active   cholecalciferol (VITAMIN D) 25 MCG (1000 UNIT) tablet [id number] Yes Take 1,000 Units by mouth daily. [provider] Taking Active Self  diltiazem (CARDIZEM CD) 120 MG 24 hr capsule [id number] Yes Take 1 capsule (120 mg total) by mouth daily. Nafziger, Tommi Rumps, NP Taking Active   escitalopram (LEXAPRO) 20 MG tablet [id number] Yes TAKE ONE TABLET BY MOUTH DAILY Nafziger, Tommi Rumps, NP Taking Active   estradiol (ESTRACE) 0.1 MG/GM vaginal cream [id number] Yes Place vaginally. [provider] Taking Active   Evolocumab (REPATHA SURECLICK) 322 MG/ML Darden Palmer [id number] Yes Inject 1 dose into the skin every 14 (fourteen) days. [name], MD Taking Active   furosemide (LASIX) 40 MG tablet [id number] Yes Take 1 tablet (40 mg total) by mouth daily. Nafziger, Tommi Rumps, NP Taking Active   glucose blood [facility] VERIO) test strip [id number] Yes USE TO CHECK BLOOD SUGAR THREE TIMES DAILY  Taking Active   Insulin NPH, Human,, Isophane, (HUMULIN N KWIKPEN) 100 UNIT/ML Mayer Masker [id number] Yes Inject 28 Units into the skin daily. Nafziger, Tommi Rumps, NP Taking Active   Insulin Pen Needle (UNIFINE PENTIPS) 32G X 4 MM MISC [id number] Yes Inject 4 times subcutaneously  Taking Active   insulin regular (HUMULIN R) 100 units/mL injection [id number] Yes Inject 0.1 mLs (10 Units total) into the skin 3 (three) times daily before meals. Nafziger, Tommi Rumps, NP Taking Active   Insulin Syringe-Needle U-100 Flossie Buffy INSULIN SYRINGE) 31G X 5/16" 1 ML MISC [id number] Yes Inject 4 times daily subcutaneously  Taking Active   Lancets (ONETOUCH DELICA PLUS BOFBPZ02H) [place] [id number] Yes Use twice  daily  Taking Active   levothyroxine (SYNTHROID) 125 MCG tablet [id number] Yes Take 1 tablet by mouth in the morning on an empty stomach once a day. 90 days  Taking Active   losartan (COZAAR) 50 MG tablet [id number] Yes TAKE ONE TABLET BY MOUTH DAILY Nafziger, Tommi Rumps, NP Taking Active   metFORMIN (GLUCOPHAGE-XR) 500 MG 24 hr tablet [id number] Yes Take 1 tablet by mouth 2 times daily  Taking Active   Multiple Vitamin (MULITIVITAMIN WITH MINERALS) TABS [id number] Yes Take 1 tablet by mouth daily. [provider] Taking Active Self  nitroGLYCERIN (NITROSTAT) 0.4 MG SL tablet [id number] Yes Place 1 tablet (0.4 mg total) under the tongue every 5 (five) minutes as needed for chest pain. [name], MD Taking Active   NOVOLIN N 100 UNIT/ML injection [id number] Yes Inject 28 Units into the skin daily before breakfast. [provider] Taking Active Self  NOVOLIN R 100 UNIT/ML injection [id number] Yes 8 Units 2 (two) times daily before a meal. 10 UNITS THIRTY MINUTES BEFORE MEALS.  5  ADDITIONAL UNITS WITH CARBS OR SNACKS. (04/26/2021:  Pt reports she only takes the 8 units 2x/daily before meals, not the 10 units) [provider] Taking Active Self  omeprazole (PRILOSEC) 20 MG capsule [id number] Yes Take 1 capsule (20 mg total) by mouth 2 (two) times daily before a meal. [name], [name], PA-C Taking Active   rOPINIRole (REQUIP) 0.25 MG tablet [id number] Yes Take 1 tablet (0.25 mg total) by mouth 3 (three) times daily. Dohmeier, Asencion Partridge, MD Taking Active   Vibegron [facility]) 75 MG TABS [id number] Yes 1 tablet PO QD  Taking Active             Patient Active Problem List   Diagnosis Date Noted   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[place]) 10/25/2021   Left hemiparesis ([place]) 06/21/2021   Other specified hereditary hemolytic anemias ([place]) 06/21/2021   Parkinsonism ([place]) 06/21/2021   COVID-19 02/14/2021   Postviral fatigue syndrome 02/14/2021   Difficulty with adaptive servo-ventilation (ASV)  use 02/14/2021   Sepsis secondary to UTI ([place]) 09/08/2020   Elevated ALT measurement 09/08/2020   History of CVA with residual deficit 09/08/2020   Hyperbilirubinemia 09/08/2020   Fatigue associated with anemia 08/03/2020   Cerebrovascular accident (CVA) due to embolism of right posterior cerebral artery ([place]) 08/03/2020   OSA treated with BiPAP 08/03/2020   Complex sleep apnea syndrome 08/03/2020   Treatment-emergent central sleep apnea 08/03/2020   Chronic intermittent hypoxia with obstructive sleep apnea 04/21/2020   OSA (obstructive sleep apnea) 04/21/2020   History of cardioembolic stroke 35/57/3220   Gait disturbance, post-stroke 03/29/2020   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 ([place]) 03/29/2020   Anxiety    RLQ abdominal pain 10/22/2019   Paroxysmal atrial fibrillation ([place]) 05/22/2019   Iron deficiency anemia 05/07/2019   Atrial fibrillation with RVR ([place]) 10/21/2018   Cerebellar stroke, acute ([place]) 10/21/2018   Streptococcal endocarditis    Endocarditis of mitral valve 10/07/2018   Bacteremia due to Streptococcus Salivarius 10/07/2018   Sepsis ([place]) 25/42/7062   Acute metabolic encephalopathy 37/62/8315   Severe aortic stenosis 07/08/2018   S/P TAVR (transcatheter aortic valve replacement) 07/08/2018   Esophageal thickening    CAD in native artery 04/22/2018   Gastroesophageal reflux disease    Pulmonary hypertension ([place]) 02/21/2018   Essential hypertension 07/15/2017   History of colonic polyps 05/22/2017   Elevated liver function tests 12/05/2016   DOE (dyspnea on exertion) 07/19/2016   Thalassemia minor 05/29/2016   Left bundle branch block 12/06/2015   Upper airway cough syndrome 17/61/6073   Diastolic heart failure ([place]) 10/10/2015   Myalgia 02/17/2014   Carotid artery stenosis 06/05/2013   Eustachian tube dysfunction 05/07/2013   Neuropathy of leg 03/07/2012   Anemia, unspecified 01/31/2012   Hot flashes 08/24/2010   Diabetes mellitus due  to underlying condition, uncontrolled 06/30/2010   Generalized abdominal pain 03/24/2010   Obesity (BMI 30.0-34.9) 12/21/2009   IBS (irritable bowel syndrome) 04/29/2009   Vitamin D deficiency 03/10/2009   Acute bronchitis 03/07/2009   Hypothyroidism 12/13/2008   Dyslipidemia 12/13/2008   Anxiety state 12/13/2008   Other specified disorders of bladder 12/13/2008    Immunization History  Administered Date(s) Administered   Fluad Quad(high Dose 65+) 01/20/2019, 01/06/2020   Hep A / Hep B 06/15/2015, 07/15/2015, 12/27/2015   Influenza Split 01/16/2012   Influenza Whole 01/31/2010   Influenza, High Dose Seasonal PF 01/28/2014, 12/29/2014, 02/17/2016, 01/21/2018   Influenza-Unspecified 12/29/2012, 01/21/2021   PFIZER(Purple Top)SARS-COV-2 Vaccination 05/16/2019, 06/06/2019, 02/06/2020, 02/05/2021   Pneumococcal Conjugate-13  06/14/2017   Pneumococcal Polysaccharide-23 01/28/2012, 01/28/2014   Pneumococcal-Unspecified 04/24/2015   Tdap 12/29/2014   Zoster Recombinat (Shingrix) 03/13/2017, 05/24/2017   Zoster, Live 02/14/2012   Patient was taking her insulins and they were expired for 3 months so she is wondering if this is related to why her blood sugar is higher. She is switching endocrinologists and is going to ask about the price of Ozempic.  Patient reports her blood sugars are going up and this scares her and she is having symptoms from the new medications.  Patient is not liking the Requip and wants to get off that. Patient has a very bad tremor in her left hand and has been making her dizziness and nauseous.  Patient is going on a cruise in August that she is very excited about this.  Conditions to be addressed/monitored:  Hypertension, Hyperlipidemia, Diabetes, Atrial Fibrillation, Coronary Artery Disease, GERD, Hypothyroidism, Overactive Bladder, and Neuropathy  Conditions addressed this visit: Hypertension, diabetes, hyperlipidemia  Care Plan : CCM Pharmacy Care Plan   Updates made by Viona Gilmore, [place] since 11/13/2021 12:00 AM     Problem: Problem: Hypertension, Hyperlipidemia, Diabetes, Atrial Fibrillation, Coronary Artery Disease, GERD, Hypothyroidism, Overactive Bladder, and Neuropathy      Long-Range Goal: Patient-Specific Goal   Start Date: 02/20/2021  Expected End Date: 02/20/2022  Recent Progress: On track  Priority: High  Note:   Current Barriers:  Unable to independently monitor therapeutic efficacy Suboptimal therapeutic regimen for diabetes  Pharmacist Clinical Goal(s):  Patient will achieve adherence to monitoring guidelines and medication adherence to achieve therapeutic efficacy maintain control of diabetes as evidenced by A1c  through collaboration with PharmD and provider.   Interventions: 1:1 collaboration with [name], NP regarding development and update of comprehensive plan of care as evidenced by provider attestation and co-signature Inter-disciplinary care team collaboration (see longitudinal plan of care) Comprehensive medication review performed; medication list updated in electronic medical record  Hypertension (BP goal <130/80) -Controlled -Current treatment: Losartan 50 mg 1 tablet daily - Appropriate, Effective, Safe, Accessible Diltiazem 120 mg 1 capsule daily - Appropriate, Effective, Safe, Accessible -Medications previously tried: n/a -Current home readings: 135/70 (normal - this is high for her) - 110-120/70 (wrist cuff) -Current dietary habits: not a salt person; cooking at home; pre-packed vegetables (no canned vegetables) -Current exercise habits: not been able to exercise because she cannot walk with knees and back -Denies hypotensive/hypertensive symptoms -Educated on Importance of home blood pressure monitoring; Proper BP monitoring technique; Symptoms of hypotension and importance of maintaining adequate hydration; -Counseled to monitor BP at home weekly, document, and provide log at future  appointments -Counseled on diet and exercise extensively Recommended to continue current medication Recommended purchasing an arm blood pressure cuff to ensure accuracy.  Hyperlipidemia: (LDL goal < 70) -Controlled -Current treatment: Repatha inject every 14 day - Appropriate, Effective, Safe, Accessible -Medications previously tried: statins (muscle aches), Zetia -Current dietary patterns: did not discuss -Current exercise habits: unable to -Educated on Cholesterol goals;  -Counseled on diet and exercise extensively Recommended to continue current medication  CAD/History of stroke (Goal: prevent future events) -Controlled -Current treatment  Eliquis 5 mg 1 tablet twice daily - Appropriate, Effective, Safe, Accessible Nitroglycerin 0.4 mg 1 tablet as needed - Appropriate, Effective, Safe, Accessible -Medications previously tried: aspirin  -Recommended to continue current medication Recommended checking expiration date for nitroglycerin.  Diabetes (A1c goal <7%) -Controlled (per patient report of recent A1c of 5.9%) -Current medications: Novolin N 28 units before breakfast -  Query Appropriate, Query effective, Safe, Query accessible Novolin R 8 units in morning and at night - Query Appropriate, Query effective, Safe, Query accessible Metformin XR 500 mg 1 tablet twice daily - Appropriate, Query effective, Safe, Accessible -Medications previously tried: other insulins (switched due to cost) -Current home glucose readings fasting glucose: 176, 154, normal is 123 as high as 200 (twice a day) post prandial glucose: going down in the afternoon (right before dinner) -Reports hypoglycemic/hyperglycemic symptoms (lethargy) -Current meal patterns:  breakfast: bran cereal or 2 eggs and toast or yogurt or croissant with cheese  lunch: not taking insulin in afternoon  dinner: meat and vegetables (varies) snacks: n/a drinks: water; stopped drinking ice brand -Current exercise: unable  to -Educated on A1c and blood sugar goals; Prevention and management of hypoglycemic episodes; Benefits of routine self-monitoring of blood sugar; Carbohydrate counting and/or plate method -Counseled to check feet daily and get yearly eye exams -Counseled on diet and exercise extensively Recommended to continue current medication Recommended discussion with endo about starting a Dexcom or Freestyle Libre and switching insulins to newer and longer lasting ones (Basaglar and Humalog)  Atrial Fibrillation (Goal: prevent stroke and major bleeding) -Controlled -CHADSVASC: 9 -Current treatment: Rate control: Diltiazem 120 mg 1 capsule daily - Appropriate, Effective, Safe, Accessible Anticoagulation: Eliquis 5 mg 1 tablet twice daily - Appropriate, Effective, Safe, Query accessible -Medications previously tried: none -Home BP and HR readings: normal at home is 68 -Counseled on increased risk of stroke due to Afib and benefits of anticoagulation for stroke prevention; bleeding risk associated with Eliquis and importance of self-monitoring for signs/symptoms of bleeding; avoidance of NSAIDs due to increased bleeding risk with anticoagulants; -Recommended to continue current medication Assessed patient finances. Patient does not qualify for patient assistance. Will find out why her copay is so high.  Depression/Anxiety (Goal: minimize symptoms) -Controlled -Current treatment: Escitalopram 20 mg 1 tablet daily - Appropriate, Effective, Safe, Accessible -Medications previously tried/failed: amitriptyline (no longer needed) -PHQ9: 0 -GAD7: n/a -Educated on Benefits of medication for symptom control Benefits of cognitive-behavioral therapy with or without medication -Recommended to continue current medication   Hypothyroidism (Goal: TSH 2.5-4.5) -Not ideally controlled (given osteopenia) -Current treatment  Levothyroxine 137 mcg 1 tablet every morning before breakfast  - Appropriate,  Effective, Query Safe, Accessible -Medications previously tried: none  -Recommended repeat TSH due to fatigue.  Overactive bladder (Goal: minimize symptoms) -Controlled -Current treatment  Gemtesa 75 mg 1 tablet daily - Appropriate, Query effective, Safe, Query accessible -Medications previously tried: Myrbetriq (ineffective) -Recommended to continue current medication  GERD/Barrett's esophagus (Goal: minimize symptoms and protect esophagus) -Not ideally controlled -Current treatment  Omeprazole 20 mg 1 capsule twice daily - Appropriate, Query effective, Safe, Accessible Famotidine 20 mg 1 tablet at bedtime - stopped taking -Medications previously tried: none  -Counseled on benefits of taking omeprazole with Barrett's esophagus. Recommended to restart omeprazole at least once daily.  Osteopenia (Goal prevent fractures) -Controlled -Last DEXA Scan: 04/08/2017   T-Score femoral neck: -1.8  T-Score total hip: n/a  T-Score lumbar spine: -2.0  T-Score forearm radius: n/a  10-year probability of major osteoporotic fracture: 11.3%  10-year probability of hip fracture: 2.3% -Patient is not a candidate for pharmacologic treatment -Current treatment  Vitamin D 1000 units daily - Appropriate, Effective, Safe, Accessible -Medications previously tried: none  -Recommend [phone] units of vitamin D daily. Recommend 1200 mg of calcium daily from dietary and supplemental sources. Recommend weight-bearing and muscle strengthening exercises for building and maintaining bone density. -Recommended  repeat DEXA.  Osteoarthritis (Goal: minimize pain) -Uncontrolled -Current treatment  Acetaminophen 500 mg 2 capsules as needed - Appropriate, Effective, Safe, Accessible -Medications previously tried: n/a  -Recommended trial of Voltaren gel for knee pain.  Health Maintenance -Vaccine gaps: influenza, COVID booster -Current therapy:  Multivitamin 1 tablet daily Vitamin B complex daily -Educated on  Cost vs benefit of each product must be carefully weighed by individual consumer -Patient is satisfied with current therapy and denies issues -Recommended to continue current medication  Patient Goals/Self-Care Activities Patient will:  - check glucose at least daily, document, and provide at future appointments check blood pressure weekly, document, and provide at future appointments target a minimum of 150 minutes of moderate intensity exercise weekly  Follow Up Plan: The care management team will reach out to the patient again over the next 14 days.       Medication Assistance:  Insulins obtained through [employer] medication assistance program.  Enrollment ends 04/21/21  Compliance/Adherence/Medication fill history: Care Gaps: Foot exam, COVID booster, A1c, eye exam Last BP - 136/62 on 10/25/2021 Last A1C - 7.4 on 09/08/2020  Star-Rating Drugs: Losartan 50 mg - last filled 08/04/2021 90 DS at [facility] Metformin 500 mg - last filled 10/02/2021 90 DS at [facility]  Patient's preferred pharmacy is:  [facility] [place] [address] Phone: [phone] Fax: [phone]   Uses pill box? Yes Pt endorses 90% compliance  We discussed: Current pharmacy is preferred with insurance plan and patient is satisfied with pharmacy services Patient decided to: Continue current medication management strategy  Care Plan and Follow Up Patient Decision:  Patient agrees to Care Plan and Follow-up.  Plan: The care management team will reach out to the patient again over the next 14 days.  [name], PharmD, [place] Pharmacist [place] [facility] [place]

## 2021-11-06 NOTE — Progress Notes (Unsigned)
ACUTE VISIT Chief Complaint  Patient presents with   Dysuria    Started a week ago   HPI: [name] is a 76 y.o. female here today complaining of DM II,HLD,IBS, peripheral neuropathy,PAD,paroxysmal atrial fib on chronic anticoagulation, HTN, and CVD c/o a week of dysuria. Worsening urinary frequency and cloudy urine. Nocturia x 5. She has not tried OTC medications.  Dysuria  This is a new problem. The current episode started in the past 7 days. The problem has been unchanged. There has been no fever. She is Not sexually active. There is No history of pyelonephritis. Associated symptoms include frequency and urgency. Pertinent negatives include no chills, discharge, flank pain, hematuria, hesitancy, nausea, sweats or vomiting.   Urine culture in 11/2020 grew Enterococcus faecalis 50,000-100,000 CFU/mL, sensitive to nitrofurantoin. Hx of overactive bladder, she is on Vibegron 75 mg daily. She follows with urologist. Reporting that medication is not helping with frequency but has helped with urgency and incontinence.  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  Severe LLQ abdominal pain, not sure if it is related to urinary symptoms. Intermittent, not radiated. She has not identified exacerbating or alleviating factors. No changes in bowel habits or blood in stool.  Hx of back pain, no more than her baseline.  She is also concerned about LE edema. She is on furosemide 40 mg daily prn.She is inquiring about frequency and if she should take [place] more often [place]. She [place] planning on [place], leaving on 12/08/2021. Lower extremity edema worse at the end of the day. She has not taken Furosemide today. Negative for CP,SOB,orthopnea,or PND.  Review of Systems  Constitutional:  Negative for  chills.  Respiratory:  Negative for cough and wheezing.   Gastrointestinal:  Negative for nausea and vomiting.  Genitourinary:  Positive for dysuria, frequency and urgency. Negative for flank pain, hematuria, hesitancy, vaginal bleeding and vaginal discharge.  Musculoskeletal:  Positive for arthralgias, back pain and gait problem.  Skin:  Negative for rash.  Neurological:  Negative for syncope.  Rest see pertinent positives and negatives per HPI.  Current Outpatient Medications on File Prior to Visit  Medication Sig Dispense Refill   apixaban (ELIQUIS) 5 MG TABS tablet Take 1 tablet (5 mg total) by mouth 2 (two) times daily. 180 tablet 3   b complex vitamins capsule Take 1 capsule by mouth daily.     BD PEN NEEDLE NANO 2ND GEN 32G X 4 MM MISC      cholecalciferol (VITAMIN D) 25 MCG (1000 UNIT) tablet Take 1,000 Units by mouth daily.     diltiazem (CARDIZEM CD) 120 MG 24 hr capsule Take 1 capsule (120 mg total) by mouth daily. 90 capsule 1   escitalopram (LEXAPRO) 20 MG tablet TAKE ONE TABLET BY MOUTH DAILY 90 tablet 1   estradiol (ESTRACE) 0.1 MG/GM vaginal cream Place vaginally.     Evolocumab (REPATHA SURECLICK) 762 MG/ML SOAJ Inject 1 dose into the skin every 14 (fourteen) days. 2 mL 11   furosemide (LASIX) 40 MG tablet Take 1 tablet (40 mg total) by mouth daily. 10 tablet 0   glucose blood (ONETOUCH VERIO) test strip USE TO CHECK BLOOD SUGAR THREE TIMES DAILY 300 strip 4   Insulin NPH, Human,, Isophane, (HUMULIN N KWIKPEN) 100 UNIT/ML Kiwkpen Inject 28 Units into the skin daily. 15 mL 0   Insulin  Pen Needle (UNIFINE PENTIPS) 32G X 4 MM MISC Inject 4 times subcutaneously 400 each 2   insulin regular (HUMULIN R) 100 units/mL injection Inject 0.1 mLs (10 Units total) into the skin 3 (three) times daily before meals. 10 mL 0   Insulin Syringe-Needle U-100 (ULTICARE INSULIN SYRINGE) 31G X 5/16" 1 ML MISC Inject 4 times daily subcutaneously 100 each 1   Lancets (ONETOUCH DELICA PLUS CHYIFO27X)  MISC Use twice daily 200 each 1   levothyroxine (SYNTHROID) 125 MCG tablet Take 1 tablet by mouth in the morning on an empty stomach once a day. 90 days 90 tablet 0   losartan (COZAAR) 50 MG tablet TAKE ONE TABLET BY MOUTH DAILY 90 tablet 1   metFORMIN (GLUCOPHAGE-XR) 500 MG 24 hr tablet Take 1 tablet by mouth 2 times daily 180 tablet 2   Multiple Vitamin (MULITIVITAMIN WITH MINERALS) TABS Take 1 tablet by mouth daily.     nitroGLYCERIN (NITROSTAT) 0.4 MG SL tablet Place 1 tablet (0.4 mg total) under the tongue every 5 (five) minutes as needed for chest pain. 25 tablet 3   NOVOLIN N 100 UNIT/ML injection Inject 28 Units into the skin daily before breakfast.     NOVOLIN R 100 UNIT/ML injection 8 Units 2 (two) times daily before a meal. 10 UNITS THIRTY MINUTES BEFORE MEALS.  5 ADDITIONAL UNITS WITH CARBS OR SNACKS. (04/26/2021:  Pt reports she only takes the 8 units 2x/daily before meals, not the 10 units)     omeprazole (PRILOSEC) 20 MG capsule Take 1 capsule (20 mg total) by mouth 2 (two) times daily before a meal. 60 capsule 3   rOPINIRole (REQUIP) 0.25 MG tablet Take 1 tablet (0.25 mg total) by mouth 3 (three) times daily. 90 tablet 5   Vibegron (GEMTESA) 75 MG TABS 1 tablet PO QD 30 tablet 0   No current facility-administered medications on file prior to visit.   Past Medical History:  Diagnosis Date   Anxiety    Arthritis    "back" (04/22/2018)   Back pain    Blood transfusion without reported diagnosis    CAD (coronary artery disease)    a. 03/2018 s/p PCI/DES to the RCA (3.0x15 Onyx DES).   Carotid artery stenosis    Mild   Chest pain    Chronic lower back pain    Cirrhosis (HCC)    Colon polyps    Diverticulitis    Diverticulosis    Esophageal thickening    seen on pre TAVR CT scan, also questionable cirrhosis. MRI recommended. Will refer to GI   Fatty liver    GERD (gastroesophageal reflux disease)    Grave's disease    History of colonic polyps 05/22/2017   History of  hiatal hernia    Hypertension    Hypothyroidism    IBS (irritable bowel syndrome)    Osteopenia    Pulmonary nodules    seen on pre TAVR CT. likley benign. no follow up recommended if pt low risk.   S/P TAVR (transcatheter aortic valve replacement)    Severe aortic stenosis    Shortness of breath on exertion    Stroke (HCC)    Thalassemia minor    Thyroid disease    Type II diabetes mellitus (HCC)    Allergies  Allergen Reactions   Statins Other (See Comments)    Muscle aches   Social History   Socioeconomic History   Marital status: Married    Spouse name: Not on file   Number  of children: 2   Years of education: Not on file   Highest education level: Not on file  Occupational History   Occupation: Tree surgeon of the [employer]  Tobacco Use   Smoking status: Never   Smokeless tobacco: Never  Vaping Use   Vaping Use: Never used  Substance and Sexual Activity   Alcohol use: No    Alcohol/week: 0.0 standard drinks of alcohol   Drug use: Never   Sexual activity: Not Currently  Other Topics Concern   Not on file  Social History Narrative   Lives with husband in a one story home.     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   Education: college.   Social Determinants of Health   Financial Resource Strain: Low Risk  (04/03/2021)   Overall Financial Resource Strain (CARDIA)    Difficulty of Paying Living Expenses: Not hard at all  Food Insecurity: No Food Insecurity (04/03/2021)   Hunger Vital Sign    Worried About Running Out of Food in the Last Year: Never true    Ran Out of Food in the Last Year: Never true  Transportation Needs: No Transportation Needs (04/03/2021)   PRAPARE - Hydrologist (Medical): No    Lack of Transportation (Non-Medical): No  Physical Activity: Inactive (04/03/2021)   Exercise Vital Sign    Days of Exercise per Week: 0 days     Minutes of Exercise per Session: 0 min  Stress: No Stress Concern Present (04/03/2021)   [place]    Feeling of Stress : Not at all  Social Connections: [place] (04/03/2021)   Social Connection and Isolation Panel [NHANES]    Frequency of Communication with Friends and Family: More than three times a week    Frequency of Social Gatherings with Friends and Family: More than three times a week    Attends Religious Services: More than 4 times per year    Active Member of [employer] or Organizations: Yes    Attends Archivist Meetings: More than 4 times per year    Marital Status: Married   Vitals:   11/07/21 1437  BP: 126/70  Pulse: 87  Resp: 16  SpO2: 97%   Body mass index is 29.79 kg/m.  Physical Exam Vitals and nursing note reviewed.  Constitutional:      General: She is not in acute distress.    Appearance: She is well-developed.  HENT:     Head: Normocephalic and atraumatic.     Mouth/Throat:     Mouth: Mucous membranes are moist.     Pharynx: Oropharynx is clear.  Eyes:     Conjunctiva/sclera: Conjunctivae normal.  Cardiovascular:     Rate and Rhythm: Normal rate and regular rhythm.     Heart sounds: Murmur (SEM I/VI RUSB) heard.     Comments: Trace pitting edema LE, bilateral. Pulmonary:     Effort: Pulmonary effort is normal. No respiratory distress.     Breath sounds: Normal breath sounds.  Abdominal:     Palpations: Abdomen is soft. There is no hepatomegaly or mass.     Tenderness: There is no abdominal tenderness.  Lymphadenopathy:     Cervical: No cervical adenopathy.  Skin:    General: Skin is warm.     Findings: No erythema or rash.  Neurological:     Mental Status: She is  alert and oriented to person, place, and time.     Motor: Tremor present.     Comments: Unstable gait assisted with a cane.  Psychiatric:     Comments: Well groomed, good eye contact.    ASSESSMENT AND PLAN:  Ms. Noeli was seen today for dysuria.  Diagnoses and all orders for this visit: Orders Placed This Encounter  Procedures   Culture, Urine   POCT urinalysis dipstick   Dysuria Urine dipstick today with moderate amount of leukocytes and positive protein. Some of her urinary symptoms seem to be chronic. Ucx ordered.  Urinary tract infection without hematuria, site unspecified Empiric abx treatment with Macrobid started today, decision based on prior Ucx report. Will tailor according to Ucx results and susceptibility report. Clearly instructed about warning signs. F/U if symptoms persist.  -     nitrofurantoin, macrocrystal-monohydrate, (MACROBID) 100 MG capsule; Take 1 capsule (100 mg total) by mouth 2 (two) times daily for 5 days.  LLQ abdominal pain Examination today negative, pain is not reproducible on examination. I do not think imaging is needed today. Instructed about warning signs. F/U in 7-10 days with PCP.  Lower extremity edema Mild. Side effects of Furosemide discussed. Recommend trying compression stockings.  Overactive bladder She is on Vibegron 75 mg daily. Continue following with urologist.  Return in about 10 days (around 11/17/2021) for UTI and LLQ pain with PCP.  Skyann Ganim G. [name], MD  [facility]. [place] office.

## 2021-11-07 ENCOUNTER — Other Ambulatory Visit (HOSPITAL_BASED_OUTPATIENT_CLINIC_OR_DEPARTMENT_OTHER): Payer: Self-pay

## 2021-11-07 ENCOUNTER — Ambulatory Visit (INDEPENDENT_AMBULATORY_CARE_PROVIDER_SITE_OTHER): Payer: Medicare Other | Admitting: Family Medicine

## 2021-11-07 ENCOUNTER — Encounter: Payer: Self-pay | Admitting: Family Medicine

## 2021-11-07 VITALS — BP 126/70 | HR 87 | Resp 16 | Ht 65.0 in | Wt 179.0 lb

## 2021-11-07 DIAGNOSIS — R600 Localized edema: Secondary | ICD-10-CM | POA: Diagnosis not present

## 2021-11-07 DIAGNOSIS — R1032 Left lower quadrant pain: Secondary | ICD-10-CM | POA: Diagnosis not present

## 2021-11-07 DIAGNOSIS — I2510 Atherosclerotic heart disease of native coronary artery without angina pectoris: Secondary | ICD-10-CM

## 2021-11-07 DIAGNOSIS — N390 Urinary tract infection, site not specified: Secondary | ICD-10-CM | POA: Diagnosis not present

## 2021-11-07 DIAGNOSIS — N3281 Overactive bladder: Secondary | ICD-10-CM | POA: Diagnosis not present

## 2021-11-07 DIAGNOSIS — R300 Dysuria: Secondary | ICD-10-CM

## 2021-11-07 LAB — POCT URINALYSIS DIPSTICK
Bilirubin, UA: NEGATIVE
Blood, UA: POSITIVE
Glucose, UA: NEGATIVE
Ketones, UA: NEGATIVE
Nitrite, UA: NEGATIVE
Protein, UA: POSITIVE — AB
Spec Grav, UA: 1.015 (ref 1.010–1.025)
Urobilinogen, UA: 0.2 E.U./dL
pH, UA: 6 (ref 5.0–8.0)

## 2021-11-07 MED ORDER — NITROFURANTOIN MONOHYD MACRO 100 MG PO CAPS
100.0000 mg | ORAL_CAPSULE | Freq: Two times a day (BID) | ORAL | 0 refills | Status: AC
  Filled 2021-11-07: qty 10, 5d supply, fill #0

## 2021-11-07 NOTE — Patient Instructions (Signed)
A few things to remember from today's visit:  Dysuria - Plan: POCT urinalysis dipstick, Culture, Urine  Urinary tract infection without hematuria, site unspecified - Plan: nitrofurantoin, macrocrystal-monohydrate, (MACROBID) 100 MG capsule  Lower extremity edema  The antibiotic I prescribed today was based on prior urine cultures. We will follow urine culture results and change the medication if necessary. If urinary frequency continues please arrange an appointment with your urologist.  In regard to lower extremity edema, furosemide can aggravate your urinary frequency.  Recommend continuing once daily as needed. Compression stockings will help.  Do not use My Chart to request refills or for acute issues that need immediate attention.    Please be sure medication list is accurate. If a new problem present, please set up appointment sooner than planned today.

## 2021-11-09 LAB — URINE CULTURE
MICRO NUMBER:: 13661699
SPECIMEN QUALITY:: ADEQUATE

## 2021-11-13 ENCOUNTER — Other Ambulatory Visit (HOSPITAL_BASED_OUTPATIENT_CLINIC_OR_DEPARTMENT_OTHER): Payer: Self-pay

## 2021-11-13 ENCOUNTER — Ambulatory Visit: Payer: Medicare Other | Admitting: Physical Therapy

## 2021-11-13 ENCOUNTER — Encounter: Payer: Self-pay | Admitting: Gastroenterology

## 2021-11-13 DIAGNOSIS — G8929 Other chronic pain: Secondary | ICD-10-CM | POA: Diagnosis not present

## 2021-11-13 DIAGNOSIS — M6281 Muscle weakness (generalized): Secondary | ICD-10-CM

## 2021-11-13 DIAGNOSIS — M25561 Pain in right knee: Secondary | ICD-10-CM | POA: Diagnosis not present

## 2021-11-13 DIAGNOSIS — R2689 Other abnormalities of gait and mobility: Secondary | ICD-10-CM

## 2021-11-13 DIAGNOSIS — R2681 Unsteadiness on feet: Secondary | ICD-10-CM | POA: Diagnosis not present

## 2021-11-13 NOTE — Patient Instructions (Signed)
Hi Annai,  It was great to catch up with you again!   Please reach out to me if you have any questions or need anything before I reach back out!  Best, Maddie  [name], PharmD, [place] Pharmacist [facility]   Visit Information   Goals Addressed   None    Patient Care Plan: RNCM:Diabetes Type 2 (Adult)  Completed 03/09/2021   Problem Identified: Lack of Long Term plan for self managment of Type 2 DM Resolved 03/09/2021  Priority: Medium     Long-Range Goal: Effective long term self management of Type 2 DM Completed 03/09/2021  Start Date: 10/03/2020  Expected End Date: 03/23/2021  Recent Progress: On track  Priority: Medium  Note:   Resolving due to duplicate goal  Objective:  Lab Results  Component Value Date   HGBA1C 7.4 (H) 09/08/2020   Lab Results  Component Value Date   CREATININE 0.67 09/16/2020   CREATININE 0.74 09/11/2020   CREATININE 0.77 09/10/2020   No results found for: EGFR Current Barriers:  Knowledge Deficits related to basic Diabetes pathophysiology and self care/management Knowledge Deficits related to medications used for management of diabetes Unable to independently self manage Type 2 DM Unable to perform IADLs independently-Husband assists with transportation  Pt states that she her blood sugars have been up some this last week with readings up to 200.  States she has not changed her diet but she did think she and her husband had a mild cold/virus this last week  States she is to see Dr. [name] 02/28/21. States her last A1C was 6.9%. Denies any recent hypoglycemia Reports following a low CHO diet.  States her husband has been helping her as needed.  States she did have a fall that injured her shoulder Case Manager Clinical Goal(s):  patient will demonstrate improved adherence to prescribed treatment plan for diabetes self care/management as evidenced by: daily monitoring and recording of CBG   adherence to ADA/ carb modified diet adherence to prescribed medication regimen contacting provider for new or worsened symptoms or questions Interventions:  Collaboration with Carlisle Cater, Tommi Rumps, NP regarding development and update of comprehensive plan of care as evidenced by provider attestation and co-signature Inter-disciplinary care team collaboration (see longitudinal plan of care) Provided education to patient about basic DM disease process Reviewed medications with patient and discussed importance of medication adherence Discussed plans with patient for ongoing care management follow up and provided patient with direct contact information for care management team Reinforced s/sx hypoglycemia and hyperglycemia and importance of correct treatment Reviewed scheduled/upcoming provider appointments including: no upcoming primary care provider scheduled, sleep study 02/11/21, GI 03/02/21, Dr. [name] 02/28/21 neurology 05/18/21 Reinforced to check cbg before meals and record, calling endocrinologist or primary care provider   for findings outside established parameters.   Review of patient status, including review of consultants reports, relevant laboratory and other test results, and medications completed. Reviewed importance of increased activity and fall prevention Self-Care Activities - Self administers oral medications as prescribed Self administers insulin as prescribed Attends all scheduled provider appointments Checks blood sugars as prescribed and utilize hyper and hypoglycemia protocol as needed Adheres to prescribed ADA/carb modified Patient Goals: - check blood sugar at prescribed times - check blood sugar if I feel it is too high or too low - enter blood sugar readings and medication or insulin into daily log - take the blood sugar log to all doctor visits - change to whole grain breads, cereal, pasta -  drink 6 to 8 glasses of water each day - fill half of plate with vegetables -  manage portion size - read food labels for fat, fiber, carbohydrates and portion size - switch to sugar-free drinks - keep appointment with eye doctor - check feet daily for cuts, sores or redness Follow Up Plan: Telephone follow up appointment with care management team member scheduled for: 03/09/21 at 11:30 AM The patient has been provided with contact information for the care management team and has been advised to call with any health related questions or concerns.      Patient Care Plan: RNCM:Urinary Incontinence (Adult)  Completed 03/09/2021   Problem Identified: Symptom Management (Urinary Incontinence) Resolved 03/09/2021  Priority: High     Long-Range Goal: Urinary Incontinence Symptoms Manged Completed 03/09/2021  Start Date: 10/03/2020  Expected End Date: 03/23/2021  Recent Progress: On track  Priority: High  Note:   Resolving due to duplicate goal  Current Barriers:  Ineffective Self Health Maintenance of urinary incontinence with hx of UTI, DM2, CAD, hx of CVA, atrial fib,  anemia Unable to independently self manage urinary incontinence Unable to perform IADLs independently-husband provides transportation to appointments States that she is still having nocturia 1-2 a night and she has urgency if she does not get to bathroom soon enough States she is to saw Dr. [name] on 12/20/20.  States she thinks the Myrbetic he gave her made her nauseated so she is not currently taking  Clinical Goal(s):  Collaboration with Pension scheme manager, Tommi Rumps, NP regarding development and update of comprehensive plan of care as evidenced by provider attestation and co-signature Inter-disciplinary care team collaboration (see longitudinal plan of care) patient will work with care management team to address care coordination and chronic disease management needs related to Disease Management Educational Needs Care Coordination   Interventions:  Evaluation of current treatment plan related to CAD, HTN, HLD,  DMII, Anxiety, and hx CVA, atrial fib , Transportation, ADL IADL limitations, and Inability to perform IADL's independently self-management and patient's adherence to plan as established by provider. Collaboration with [name], NP regarding development and update of comprehensive plan of care as evidenced by provider attestation       and co-signature Inter-disciplinary care team collaboration (see longitudinal plan of care) Discussed plans with patient for ongoing care management follow up and provided patient with direct contact information for care management team Reinforced on s/sx UTI and when to call MD Reinforced to try doing pelvic floor exercises when she feels urgency and to void every 2 hours during the day Reinforced to keep perineal area clean and dry Reinforced to keep urology appointments with Dr. [name] and encouraged to discuss treatment options for incontinence and overactive bladder Referral to CCM PharmD for medication review, adherence and polypharmacy  Self Care Activities:  Patient verbalizes understanding of plan to self manage urinary incontinence Self administers medications as prescribed Attends all scheduled provider appointments Calls pharmacy for medication refills Calls provider office for new concerns or questions Patient Goals: - clean and dry skin well - keep skin dry - use a fragrance-free lotion on skin - wear a protective pad or garment Follow Up Plan: Telephone follow up appointment with care management team member scheduled for: 03/09/21 at 11:30 AM The patient has been provided with contact information for the care management team and has been advised to call with any health related questions or concerns.      Patient Care Plan: RNCM:Cardiovascular Disease Management(HTN, CAD, Atrial fibrillation)  Completed 03/09/2021  Problem Identified: Lack of long term management of Cardiovascular Disease(HTN, CAD, Atrial fibrillation Resolved  03/09/2021  Priority: Medium     Long-Range Goal: Effective Cardiovascular Disease Self  Management(HTN, CAD, Atrial fibrillation) Completed 03/09/2021  Start Date: 10/03/2020  Expected End Date: 03/23/2021  Recent Progress: On track  Priority: Medium  Note:   Resolving due to duplicate goal  Current Barriers:  Knowledge deficits related to self health management of Cardiovascular Disease Management(HTN, CAD, Atrial fibrillation)  Knowledge Deficits related to Self management of Cardiovascular Disease Management(HTN, CAD, Atrial fibrillation Care Coordination needs related to disease management  in a patient with Self management of Cardiovascular Disease Management(HTN, CAD, Atrial fibrillation Chronic Disease Management support and education needs related to Self management of Cardiovascular Disease Management(HTN, CAD, Atrial fibrillation Unable to independently Self management of Cardiovascular Disease Management(HTN, CAD, Atrial fibrillation Unable to perform IADLs independently States her B/P has been good with readings around 120-130/70-80, States she is having fatigue and is taking naps during the day.  States she has been having issues with her CPAP.  States she is to have a sleep study on 02/09/21.  States she thinks some of her medications are causing her drowsiness and wonders if she needs all of these medications No reports of chest pain or shortness of breath.  Nurse Case Manager Clinical Goal(s):  patient will take all medications exactly as prescribed and will call provider for medication related questions patient will verbalize understanding of Afib Action Plan and when to call doctor patient will verbalize understanding of plan for Self management of Cardiovascular Disease Management(HTN, CAD, Atrial fibrillation patient will meet with RN Care Manager to address Self management of Cardiovascular Disease Management(HTN, CAD, Atrial fibrillation patient will take all medications  exactly as prescribed and will call provider for medication related questions patient will attend all scheduled medical appointments: no upcoming primary care provider scheduled, sleep study 02/11/21, GI 03/02/21, Dr. [name] 02/28/21 neurology 05/18/21 patient will verbalize basic understanding of Self management of Cardiovascular Disease Management(HTN, CAD, Atrial fibrillation disease process and self health management plan as evidenced by verbalizing understanding of education, voiced adherence to treatment plan the patient will demonstrate ongoing self health care management ability as evidenced by voiced understanding of treatment plan and decrease in hospitalizations * Interventions:  Collaboration with [name], NP regarding development and update of comprehensive plan of care as evidenced by provider attestation and co-signature Inter-disciplinary care team collaboration (see longitudinal plan of care) Basic overview and discussion of HTN, CAD, Atrial fibrillation Medications reviewed Afib action plan reviewed Evaluation of current treatment plan related to Self management of Cardiovascular Disease Management(HTN, CAD, Atrial fibrillation and patient's adherence to plan as established by provider. Reinforced to discuss issues with Dr. Brett Fairy concerning her CPAP Reviewed education  re: HTN, CAD, Atrial fibrillation Reviewed medications with patient and discussed adherence Reinforced to monitor blood pressure daily and record, calling provider for findings outside established parameters.  Discussed plans with patient for ongoing care management follow up and provided patient with direct contact information for care management team Referral to CCM PharmD to review medications and issues with polypharmacy  Self-Care Activities: Self administers medications as prescribed Attends all scheduled provider appointments Calls pharmacy for medication refills Calls provider office for new  concerns or questions Patient Goals: - begin a symptom diary - make a plan to eat healthy - keep all lab appointments - take medicine as prescribed - check blood pressure 3 times per week - choose a place to take  my blood pressure (home, clinic or office, retail store) - write blood pressure results in a log or diary Follow Up Plan: Telephone follow up appointment with care management team member scheduled for: 03/09/21 at 11:30 AM The patient has been provided with contact information for the care management team and has been advised to call with any health related questions or concerns.      Patient Care Plan: CCM Pharmacy Care Plan     Problem Identified: Problem: Hypertension, Hyperlipidemia, Diabetes, Atrial Fibrillation, Coronary Artery Disease, GERD, Hypothyroidism, Overactive Bladder, and Neuropathy      Long-Range Goal: Patient-Specific Goal   Start Date: 02/20/2021  Expected End Date: 02/20/2022  Recent Progress: On track  Priority: High  Note:   Current Barriers:  Unable to independently monitor therapeutic efficacy Suboptimal therapeutic regimen for diabetes  Pharmacist Clinical Goal(s):  Patient will achieve adherence to monitoring guidelines and medication adherence to achieve therapeutic efficacy maintain control of diabetes as evidenced by A1c  through collaboration with PharmD and provider.   Interventions: 1:1 collaboration with [name], NP regarding development and update of comprehensive plan of care as evidenced by provider attestation and co-signature Inter-disciplinary care team collaboration (see longitudinal plan of care) Comprehensive medication review performed; medication list updated in electronic medical record  Hypertension (BP goal <130/80) -Controlled -Current treatment: Losartan 50 mg 1 tablet daily - Appropriate, Effective, Safe, Accessible Diltiazem 120 mg 1 capsule daily - Appropriate, Effective, Safe, Accessible -Medications  previously tried: n/a -Current home readings: 135/70 (normal - this is high for her) - 110-120/70 (wrist cuff) -Current dietary habits: not a salt person; cooking at home; pre-packed vegetables (no canned vegetables) -Current exercise habits: not been able to exercise because she cannot walk with knees and back -Denies hypotensive/hypertensive symptoms -Educated on Importance of home blood pressure monitoring; Proper BP monitoring technique; Symptoms of hypotension and importance of maintaining adequate hydration; -Counseled to monitor BP at home weekly, document, and provide log at future appointments -Counseled on diet and exercise extensively Recommended to continue current medication Recommended purchasing an arm blood pressure cuff to ensure accuracy.  Hyperlipidemia: (LDL goal < 70) -Controlled -Current treatment: Repatha inject every 14 day - Appropriate, Effective, Safe, Accessible -Medications previously tried: statins (muscle aches), Zetia -Current dietary patterns: did not discuss -Current exercise habits: unable to -Educated on Cholesterol goals;  -Counseled on diet and exercise extensively Recommended to continue current medication  CAD/History of stroke (Goal: prevent future events) -Controlled -Current treatment  Eliquis 5 mg 1 tablet twice daily - Appropriate, Effective, Safe, Accessible Nitroglycerin 0.4 mg 1 tablet as needed - Appropriate, Effective, Safe, Accessible -Medications previously tried: aspirin  -Recommended to continue current medication Recommended checking expiration date for nitroglycerin.  Diabetes (A1c goal <7%) -Controlled (per patient report of recent A1c of 5.9%) -Current medications: Novolin N 28 units before breakfast - Query Appropriate, Query effective, Safe, Query accessible Novolin R 8 units in morning and at night - Query Appropriate, Query effective, Safe, Query accessible Metformin XR 500 mg 1 tablet twice daily - Appropriate, Query  effective, Safe, Accessible -Medications previously tried: other insulins (switched due to cost) -Current home glucose readings fasting glucose: 176, 154, normal is 123 as high as 200 (twice a day) post prandial glucose: going down in the afternoon (right before dinner) -Reports hypoglycemic/hyperglycemic symptoms (lethargy) -Current meal patterns:  breakfast: bran cereal or 2 eggs and toast or yogurt or croissant with cheese  lunch: not taking insulin in afternoon  dinner: meat and vegetables (varies)  snacks: n/a drinks: water; stopped drinking ice brand -Current exercise: unable to -Educated on A1c and blood sugar goals; Prevention and management of hypoglycemic episodes; Benefits of routine self-monitoring of blood sugar; Carbohydrate counting and/or plate method -Counseled to check feet daily and get yearly eye exams -Counseled on diet and exercise extensively Recommended to continue current medication Recommended discussion with endo about starting a Dexcom or Freestyle Libre and switching insulins to newer and longer lasting ones (Basaglar and Humalog)  Atrial Fibrillation (Goal: prevent stroke and major bleeding) -Controlled -CHADSVASC: 9 -Current treatment: Rate control: Diltiazem 120 mg 1 capsule daily - Appropriate, Effective, Safe, Accessible Anticoagulation: Eliquis 5 mg 1 tablet twice daily - Appropriate, Effective, Safe, Query accessible -Medications previously tried: none -Home BP and HR readings: normal at home is 68 -Counseled on increased risk of stroke due to Afib and benefits of anticoagulation for stroke prevention; bleeding risk associated with Eliquis and importance of self-monitoring for signs/symptoms of bleeding; avoidance of NSAIDs due to increased bleeding risk with anticoagulants; -Recommended to continue current medication Assessed patient finances. Patient does not qualify for patient assistance. Will find out why her copay is so  high.  Depression/Anxiety (Goal: minimize symptoms) -Controlled -Current treatment: Escitalopram 20 mg 1 tablet daily - Appropriate, Effective, Safe, Accessible -Medications previously tried/failed: amitriptyline (no longer needed) -PHQ9: 0 -GAD7: n/a -Educated on Benefits of medication for symptom control Benefits of cognitive-behavioral therapy with or without medication -Recommended to continue current medication   Hypothyroidism (Goal: TSH 2.5-4.5) -Not ideally controlled (given osteopenia) -Current treatment  Levothyroxine 137 mcg 1 tablet every morning before breakfast  - Appropriate, Effective, Query Safe, Accessible -Medications previously tried: none  -Recommended repeat TSH due to fatigue.  Overactive bladder (Goal: minimize symptoms) -Controlled -Current treatment  Gemtesa 75 mg 1 tablet daily - Appropriate, Query effective, Safe, Query accessible -Medications previously tried: Myrbetriq (ineffective) -Recommended to continue current medication  GERD/Barrett's esophagus (Goal: minimize symptoms and protect esophagus) -Not ideally controlled -Current treatment  Omeprazole 20 mg 1 capsule twice daily - Appropriate, Query effective, Safe, Accessible Famotidine 20 mg 1 tablet at bedtime - stopped taking -Medications previously tried: none  -Counseled on benefits of taking omeprazole with Barrett's esophagus. Recommended to restart omeprazole at least once daily.  Osteopenia (Goal prevent fractures) -Controlled -Last DEXA Scan: 04/08/2017   T-Score femoral neck: -1.8  T-Score total hip: n/a  T-Score lumbar spine: -2.0  T-Score forearm radius: n/a  10-year probability of major osteoporotic fracture: 11.3%  10-year probability of hip fracture: 2.3% -Patient is not a candidate for pharmacologic treatment -Current treatment  Vitamin D 1000 units daily - Appropriate, Effective, Safe, Accessible -Medications previously tried: none  -Recommend [phone] units of  vitamin D daily. Recommend 1200 mg of calcium daily from dietary and supplemental sources. Recommend weight-bearing and muscle strengthening exercises for building and maintaining bone density. -Recommended repeat DEXA.  Osteoarthritis (Goal: minimize pain) -Uncontrolled -Current treatment  Acetaminophen 500 mg 2 capsules as needed - Appropriate, Effective, Safe, Accessible -Medications previously tried: n/a  -Recommended trial of Voltaren gel for knee pain.  Health Maintenance -Vaccine gaps: influenza, COVID booster -Current therapy:  Multivitamin 1 tablet daily Vitamin B complex daily -Educated on Cost vs benefit of each product must be carefully weighed by individual consumer -Patient is satisfied with current therapy and denies issues -Recommended to continue current medication  Patient Goals/Self-Care Activities Patient will:  - check glucose at least daily, document, and provide at future appointments check blood pressure weekly, document, and provide at  future appointments target a minimum of 150 minutes of moderate intensity exercise weekly  Follow Up Plan: The care management team will reach out to the patient again over the next 14 days.      Patient Care Plan: RN Care Manager Plan of Care     Problem Identified: Chronic Disease Management and Care Coordination Needs (DM2, CAD, Afib,HTN. Hx CVA )   Priority: High     Long-Range Goal: Establish Plan of Care for Chronic Disease Management Needs (DM2, CAD, Afib,HTN. Hx CVA )   Start Date: 03/09/2021  Expected End Date: 07/20/2022  Recent Progress: On track  Priority: High  Note:   Current Barriers:  Care Coordination needs related to Financial constraints related to cost of medications Chronic Disease Management support and education needs related to Atrial Fibrillation, CAD, HTN, DMII, GERD, Overactive Bladder, and hx CVA Pt states she is still going to water therapy and she states she feels better when she is in  the water. States she is  walking  with a cane now.  States she saw Dr. Wynelle Link and she got injections in her knees. States she has not noticed if the gel shots have helped yet.   Denies any recent falls.  States her energy level is getting better.  States she does have to get up 1-2 times a night to urinate. States the [place] she is taking is very expensive.  Denies any chest pains but does get winded with exertion.  States  she has not heard from the new endocrinologist about setting up an appointment.  States her CBGs had been AM ranges of 102-134 and PM 64-200.  States she only has  lower reading  when she does not wake up from her nap soon enough.  States she has not been checking her B/P at home as she has checked at her doctors appointments RNCM Clinical Goal(s):  Patient will verbalize understanding of plan for management of Atrial Fibrillation, CAD, HTN, DMII, GERD, Overactive Bladder, and hx CVA as evidenced by voiced adherence to plan of care verbalize basic understanding of  Atrial Fibrillation, CAD, HTN, DMII, GERD, Overactive Bladder, and hx CVA disease process and self health management plan as evidenced by voiced understanding and teach back take all medications exactly as prescribed and will call provider for medication related questions as evidenced by dispense report and pt verbalization attend all scheduled medical appointments:  Neurology 10/25/21, CCM PharmD 11/06/21, Annual Wellness Visit 04/05/22 as evidenced by medical records demonstrate Improved adherence to prescribed treatment plan for Atrial Fibrillation, CAD, HTN, DMII, GERD, Overactive Bladder, and hx CVA as evidenced by readings within limits, voiced adherence to plan of care continue to work with RN Care Manager to address care management and care coordination needs related to  Atrial Fibrillation, CAD, HTN, DMII, GERD, Overactive Bladder, and hx CVA as evidenced by adherence to CM Team Scheduled appointments work with  pharmacist to address Financial constraints related to cost of Myrbetriq and Eliquis related toAtrial Fibrillation, CAD, HTN, DMII, GERD, Overactive Bladder, and hx CVA as evidenced by review or EMR and patient or pharmacist report through collaboration with RN Care manager, provider, and care team.   Interventions: 1:1 collaboration with primary care provider regarding development and update of comprehensive plan of care as evidenced by provider attestation and co-signature Inter-disciplinary care team collaboration (see longitudinal plan of care) Evaluation of current treatment plan related to  self management and patient's adherence to plan as established by provider  Falls Interventions:  (  Status:  Goal on track:  Yes.) Long Term Goal Reviewed medications and discussed potential side effects of medications such as dizziness and frequent urination Advised patient of importance of notifying provider of falls Assessed for falls since last encounter Assessed patients knowledge of fall risk prevention secondary to previously provided education Reinforced to continue to do exercises PT is teaching her to get stronger and going to water therapy.  Reinforced to use cane at all times     AFIB Interventions: (Status:  Goal on track:  Yes.) Long Term Goal   Counseled on increased risk of stroke due to Afib and benefits of anticoagulation for stroke prevention Reviewed importance of adherence to anticoagulant exactly as prescribed Counseled on bleeding risk associated with Eliquis and importance of self-monitoring for signs/symptoms of bleeding Counseled on avoidance of NSAIDs due to increased bleeding risk with anticoagulants Counseled on seeking medical attention after a head injury or if there is blood in the urine/stool Afib action plan reviewed Reviewed instructions for upcoming abdominal U/S and that she can take her Eliquis with a sip of water  that morning   CAD Interventions: (Status:  Goal  on track:  Yes.) Long Term Goal Assessed understanding of CAD diagnosis Medications reviewed including medications utilized in CAD treatment plan Provided education on importance of blood pressure control in management of CAD Provided education on Importance of limiting foods high in cholesterol Counseled on importance of regular laboratory monitoring as prescribed Reviewed Importance of taking all medications as prescribed Reviewed Importance of attending all scheduled provider appointments Advised to report any changes in symptoms or exercise tolerance Reviewed s/sx to call provider or 911   Diabetes Interventions:  (Status:  Goal on track:  Yes.) Long Term Goal Assessed patient's understanding of A1c goal: <7% Provided education to patient about basic DM disease process Reviewed medications with patient and discussed importance of medication adherence Counseled on importance of regular laboratory monitoring as prescribed Discussed plans with patient for ongoing care management follow up and provided patient with direct contact information for care management team Advised patient, providing education and rationale, to check cbg twice a day and record, calling provider for findings outside established parameters Reinforced with pt need to get new endocrinologist. Communicated with provider that pt has not been contacted about referral for new endocrinologist.  Reinforced to try to eat lunch or small snack before taking her nap to help prevent hypoglycemia Lab Results  Component Value Date   HGBA1C 7.4 (H) 09/08/2020  6.3% 08/28/21 at endocrinology  Hyperlipidemia Interventions:  (Status:  Goal on track:  Yes.) Long Term Goal Medication review performed; medication list updated in electronic medical record.  Provider established cholesterol goals reviewed Counseled on importance of regular laboratory monitoring as prescribed Reviewed role and benefits of statin for ASCVD risk  reduction Reviewed importance of limiting foods high in cholesterol Reviewed exercise goals and target of 150 minutes per week Reinforced to continue Repatha   Hypertension Interventions:  (Status:  Goal on track:  Yes.) Long Term Goal Last practice recorded BP readings:  BP Readings from Last 3 Encounters:  10/12/21 120/68  09/06/21 120/65  08/18/21 120/60  Most recent eGFR/CrCl: No results found for: EGFR  No components found for: CRCL  Evaluation of current treatment plan related to hypertension self management and patient's adherence to plan as established by provider Provided education to patient re: stroke prevention, s/s of heart attack and stroke Reviewed medications with patient and discussed importance of compliance Counseled on the  importance of exercise goals with target of 150 minutes per week Discussed plans with patient for ongoing care management follow up and provided patient with direct contact information for care management team Advised patient, providing education and rationale, to monitor blood pressure daily and record, calling PCP for findings outside established parameters Provided education on prescribed diet low sodium low CHO heart healthy  Stroke:  (Status:Goal on track:  Yes.) Long Term Goal Reviewed Importance of taking all medications as prescribed Advised to report any changes in symptoms or exercise tolerance Assessed for management of bladder and/or bowel incontinence Reinforced to keep appointment with neurology. Reinforced to continue PT to help with balance and strength    Urinary frequency Goal on track:  Yes. Long Term Goal Evaluation of current treatment plan related to Overactive Bladder, Financial constraints related to cost of Myrbetric  self-management and patient's adherence to plan as established by provider. Discussed plans with patient for ongoing care management follow up and provided patient with direct contact information for care  management team Evaluation of current treatment plan related to Urinary frequency and patient's adherence to plan as established by provider Pharmacy referral for communicated to PharmD pts issues with cost of Gemtesa  Discussed plans with patient for ongoing care management follow up and provided patient with direct contact information for care management team Reinforced to limit fluids later in the evening to help decrease night time trips to the bathroom. Reinforced to keep appointments  Dr. [name] and urology. Reviewed to contact urology office to see if she can get samples of Gemtesa or change her something less expensive    Patient Goals/Self-Care Activities: Take all medications as prescribed Attend all scheduled provider appointments Call pharmacy for medication refills 3-7 days in advance of running out of medications Call provider office for new concerns or questions  keep appointment with eye doctor check blood sugar at prescribed times: twice daily and when you have symptoms of low or high blood sugar fill half of plate with vegetables manage portion size keep feet up while sitting wash and dry feet carefully every day wear comfortable, cotton socks wear comfortable, well-fitting shoes check pulse (heart) rate once a day make a plan to eat healthy take medicine as prescribed check blood pressure daily choose a place to take my blood pressure (home, clinic or office, retail store) call doctor for signs and symptoms of high blood pressure keep all doctor appointments eat more whole grains, fruits and vegetables, lean meats and healthy fats call for medicine refill 2 or 3 days before it runs out take all medications exactly as prescribed call doctor with any symptoms you believe are related to your medicine Follow Up Plan:  Telephone follow up appointment with care management team member scheduled for:  11/20/21 The patient has been provided with contact information for the  care management team and has been advised to call with any health related questions or concerns.          Patient verbalizes understanding of instructions and care plan provided today and agrees to view in [place]. Active MyChart status and patient understanding of how to access instructions and care plan via MyChart confirmed with patient.    The pharmacy team will reach out to the patient again over the next 14 days.   Viona Gilmore, [facility]

## 2021-11-14 ENCOUNTER — Telehealth: Payer: Self-pay | Admitting: Adult Health

## 2021-11-14 ENCOUNTER — Ambulatory Visit: Payer: Medicare Other | Admitting: Adult Health

## 2021-11-14 ENCOUNTER — Encounter: Payer: Self-pay | Admitting: Physical Therapy

## 2021-11-14 ENCOUNTER — Other Ambulatory Visit (HOSPITAL_BASED_OUTPATIENT_CLINIC_OR_DEPARTMENT_OTHER): Payer: Self-pay

## 2021-11-14 ENCOUNTER — Ambulatory Visit: Payer: Medicare Other | Admitting: Occupational Therapy

## 2021-11-14 ENCOUNTER — Other Ambulatory Visit: Payer: Self-pay | Admitting: Interventional Cardiology

## 2021-11-14 MED ORDER — NITROGLYCERIN 0.4 MG SL SUBL
0.4000 mg | SUBLINGUAL_TABLET | SUBLINGUAL | 4 refills | Status: DC | PRN
Start: 2021-11-14 — End: 2023-05-15
  Filled 2021-11-14: qty 25, 5d supply, fill #0

## 2021-11-14 NOTE — Therapy (Addendum)
[facility] [address] [place], [place], [address] Phone: [phone]   Fax:  [phone]  Physical Therapy Treatment  Patient Details  Name: Breanna Webster MRN: [id number] Date of Birth: 1945/05/06 Referring Provider (PT): Dohmeier, Asencion Partridge   Encounter Date: 11/13/2021   PT End of Session - 11/14/21 2046     Visit Number 25    Number of Visits 29    Date for PT Re-Evaluation 12/04/21    Authorization Type Medicare/Generic Commercial    Authorization Time Period 08-07-21 - 09-20-21; 10-30-21 - 12-31-21    Progress Note Due on Visit 20    PT Start Time 1335    PT Stop Time 1415    PT Time Calculation (min) 40 min    Equipment Utilized During Treatment Other (comment)   bar bells, pool noodle   Activity Tolerance Patient tolerated treatment well    Behavior During Therapy [facility] for tasks assessed/performed             Past Medical History:  Diagnosis Date   Anxiety    Arthritis    "back" (04/22/2018)   Back pain    Blood transfusion without reported diagnosis    CAD (coronary artery disease)    a. 03/2018 s/p PCI/DES to the RCA (3.0x15 Onyx DES).   Carotid artery stenosis    Mild   Chest pain    Chronic lower back pain    Cirrhosis (HCC)    Colon polyps    Diverticulitis    Diverticulosis    Esophageal thickening    seen on pre TAVR CT scan, also questionable cirrhosis. MRI recommended. Will refer to GI   Fatty liver    GERD (gastroesophageal reflux disease)    Grave's disease    History of colonic polyps 05/22/2017   History of hiatal hernia    Hypertension    Hypothyroidism    IBS (irritable bowel syndrome)    Osteopenia    Pulmonary nodules    seen on pre TAVR CT. likley benign. no follow up recommended if pt low risk.   S/P TAVR (transcatheter aortic valve replacement)    Severe aortic stenosis    Shortness of breath on exertion    Stroke ([place])    Thalassemia minor    Thyroid disease     Type II diabetes mellitus ([place])     Past Surgical History:  Procedure Laterality Date   64 HOUR [place] STUDY N/A 03/03/2018   Procedure: 24 HOUR PH STUDY;  Surgeon: Mauri Pole, MD;  Location: [facility];  Service: Endoscopy;  Laterality: N/A;   AORTIC VALVE REPLACEMENT  06/2018   COLONOSCOPY     COLONOSCOPY W/ BIOPSIES AND POLYPECTOMY     CORONARY ANGIOGRAPHY Right 04/21/2018   Procedure: CORONARY ANGIOGRAPHY (CATH LAB);  Surgeon: [name], MD;  Location: [place] CV LAB;  Service: Cardiovascular;  Laterality: Right;   CORONARY STENT INTERVENTION N/A 04/22/2018   Procedure: CORONARY STENT INTERVENTION;  Surgeon: [name], MD;  Location: [place] CV LAB;  Service: Cardiovascular;  Laterality: N/A;   DILATION AND CURETTAGE OF UTERUS     ESOPHAGEAL MANOMETRY N/A 03/03/2018   Procedure: ESOPHAGEAL MANOMETRY (EM);  Surgeon: Mauri Pole, MD;  Location: [facility];  Service: Endoscopy;  Laterality: N/A;   GASTRIC FUNDOPLICATION     HERNIA REPAIR     HYSTEROSCOPY     fibroids   LAPAROSCOPIC CHOLECYSTECTOMY     LAPAROSCOPY  fibroids   NISSEN FUNDOPLICATION  7989Q   POLYPECTOMY     RIGHT/LEFT HEART CATH AND CORONARY ANGIOGRAPHY N/A 02/20/2018   Procedure: RIGHT/LEFT HEART CATH AND CORONARY ANGIOGRAPHY;  Surgeon: [name], MD;  Location: [place] CV LAB;  Service: Cardiovascular;  Laterality: N/A;   TEE WITHOUT CARDIOVERSION N/A 07/08/2018   Procedure: TRANSESOPHAGEAL ECHOCARDIOGRAM (TEE);  Surgeon: Burnell Blanks, MD;  Location: [facility];  Service: Open Heart Surgery;  Laterality: N/A;   TEE WITHOUT CARDIOVERSION  10/07/2018   TEE WITHOUT CARDIOVERSION N/A 10/07/2018   Procedure: TRANSESOPHAGEAL ECHOCARDIOGRAM (TEE);  Surgeon: Jerline Pain, MD;  Location: [facility] ENDOSCOPY;  Service: Cardiovascular;  Laterality: N/A;   TONSILLECTOMY     TRANSCATHETER AORTIC VALVE REPLACEMENT, TRANSFEMORAL N/A 07/08/2018   Procedure: TRANSCATHETER AORTIC VALVE  REPLACEMENT, TRANSFEMORAL;  Surgeon: Burnell Blanks, MD;  Location: [facility];  Service: Open Heart Surgery;  Laterality: N/A;    There were no vitals filed for this visit.   Subjective Assessment - 11/14/21 2043     Subjective Pt returns to pool after cancellation last week due to having had UTI;  pt states she is feeling better but continues to have Rt knee pain - states pain is getting worse, not better    Pertinent History significant arthritis in knees    Patient Stated Goals Pt's goals for therapy are to get better with walking and with arthritis.    Currently in Pain? Yes    Pain Score 8     Pain Location Knee    Pain Orientation Right    Pain Descriptors / Indicators Aching;Grimacing;Nagging;Sore    Pain Type Chronic pain    Pain Onset More than a month ago    Pain Frequency Intermittent                 Aquatic therapy at [facility] - pool temp 90 degrees  Patient seen for aquatic therapy today.  Treatment took place in water 3.5-4.5 feet deep depending upon activity.  Pt entered & exited the pool  the pool via step negotiation using step by step sequence for descension and ascension with use of hand rails with supervision; cues needed for correct sequence to minimize pain in Rt knee  Pt performed water walking 18' x approx. 6 reps forwards across pool, using pool noodle for increased floatation for unweighting RLE for reduced Rt knee pain; 18' x 4 reps with UE support on large yellow noodle;  18' x 4 reps backwards across width of pool with UE support on noodle  Pt performed standing bil. Hip exercises - flexion, abduction and extension 10 reps each leg  Bil. Heel raises 10 reps with UE support on pool edge  Pt performed marching in place 10 reps with bil. UE support on pool edge prn;  marching forwards 18' x 1 rep with use of bar bells   Pt performed squats at edge of pool 10 reps x 2 sets  Pt performed water walking (forward) 18' x 4 reps  with no UE support at end of session   Pt requires buoyancy of water for support and safety with standing balance exercises; exercises are able to be safely attempted/performed in water without or with minimal UE support that cannot be performed safely on land.  Viscosity of water is needed for resistance for strengthening.  Current of water provides perturbations for challenges with standing balance.  Buoyancy of water also provides joint offloading which increases comfort and reduces pain  in knees with weight bearing activities and exs in water, whereas, this is unable to be achieved with weight bearing exercises on land.                               PT Short Term Goals - 11/14/21 2047       PT SHORT TERM GOAL #1   Title Pt will be independent with HEP for improved strength and flexibility, balance, transfers, and gait.   TARGET 05/26/2021    Time 4    Period Weeks    Status Achieved      PT SHORT TERM GOAL #2   Title Pt will improve Berg score to at least 41/56 to decrease fall risk.    Baseline 36/56    Time 4    Period Weeks    Status Achieved      PT SHORT TERM GOAL #3   Title Pt will improve TUG score to less than or equal to 21 sec for decreased fall risk.    Baseline 26.88 sec    Time 4    Period Weeks    Status Achieved      PT SHORT TERM GOAL #4   Title Pt will verbalize understanding of fall prevention in home environment.    Time 4    Period Weeks    Status Achieved               PT Long Term Goals - 11/14/21 2047       PT LONG TERM GOAL #1   Title Pt will be independent with HEP for improved flexibility and strength, balance, transfers, and gait.  TARGET 06/23/2021    Time 5    Period Weeks    Status Achieved    Target Date 07/18/21      PT LONG TERM GOAL #2   Title Pt will improve Berg score to at least 46/56 to decrease fall risk.    Time 5    Period Weeks    Status Achieved   47/56   Target Date 07/18/21      PT LONG  TERM GOAL #3   Title Pt will improve TUG score to less than or equal to 16 sec for decreased fall risk.    Baseline Not assessed - pt has been receiving aquatic therapy only for past 6 weeks    Time 5    Period Weeks    Status Deferred   18.43 sec   Target Date 07/18/21      PT LONG TERM GOAL #4   Title Pt will improve gait velocity to at least 2 ft/sec for improved gait efficiency and safety.    Baseline 1.73 ft/sec    Time 9    Period Weeks    Status Achieved   2.69 ft/s     PT LONG TERM GOAL #5   Title Pt will verbalize plans for continued community fitness upon d/c to maximize gains made in PT.    Time 9    Period Weeks    Status Achieved   reports plans to join gym and walk at [place] #6   Title Pt will be independent in aquatic exercise program to be continued upon D/C from PT.    Baseline pt is doing land exercises only - is too fearful to do pool exercises independently and does not have access to  a pool    Time 6    Period Weeks    Status On-going    Target Date 12/04/21      PT LONG TERM GOAL #7   Title Pt will report Rt knee pain intensity </= 6/10 with weight bearing.    Baseline 8/10 reported on 10-31-21    Time 6    Period Weeks    Status New    Target Date 12/04/21      PT LONG TERM GOAL #8   Title Pt will negotiate steps in pool with use of handrail using step by step sequence independently with no cues for correct sequence for Rt knee pain management (down with RLE, up with LLE).    Baseline cues needed for correct sequence in order to minimize Rt knee pain - 10-31-21    Time 4    Period Weeks    Status New    Target Date 12/04/21                   Plan - 11/14/21 2048     Clinical Impression Statement Aquatic therapy session focused on water walking, strengthening and balance exercises, and Rt knee ROM exercises.  Pt reporting increased Rt knee pain is being experienced on land but reports significant decrease in Rt  knee pain in water, allowing increased weight bearing exercises and increased tolerance for ambulation, i.e. water walking; pt is unable to amb. prolonged distances on land due to pain in Rt knee with weight bearing.  Cont with POC.    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[place]) 06/21/2021   Parkinsonism ([place]) 06/21/2021   COVID-19 02/14/2021   Postviral fatigue syndrome 02/14/2021   Difficulty with adaptive servo-ventilation (ASV) use 02/14/2021   Sepsis secondary to UTI ([place]) 09/08/2020   Elevated ALT measurement 09/08/2020   History of CVA with residual deficit 09/08/2020   Hyperbilirubinemia 09/08/2020   Fatigue associated with anemia 08/03/2020   Cerebrovascular accident (CVA) due to embolism of right posterior cerebral artery ([place]) 08/03/2020   OSA treated with BiPAP 08/03/2020   Complex sleep apnea syndrome 08/03/2020   Treatment-emergent central sleep apnea 08/03/2020   Chronic intermittent hypoxia with obstructive sleep apnea 04/21/2020   OSA (obstructive sleep apnea) 04/21/2020   History of cardioembolic stroke 82/70/7867   Gait disturbance, post-stroke 03/29/2020   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 ([place]) 03/29/2020   Anxiety    RLQ abdominal pain 10/22/2019   Paroxysmal atrial fibrillation ([place]) 05/22/2019   Iron deficiency anemia 05/07/2019   Atrial fibrillation with RVR ([place]) 10/21/2018   Cerebellar stroke,  acute ([place]) 10/21/2018   Streptococcal endocarditis    Endocarditis of mitral valve 10/07/2018   Bacteremia due to Streptococcus Salivarius 10/07/2018   Sepsis ([place]) 54/49/2010   Acute metabolic encephalopathy 11/01/1973   Severe aortic stenosis 07/08/2018   S/P TAVR (transcatheter aortic valve replacement) 07/08/2018  Esophageal thickening    CAD in native artery 04/22/2018   Gastroesophageal reflux disease    Pulmonary hypertension ([place]) 02/21/2018   Essential hypertension 07/15/2017   History of colonic polyps 05/22/2017   Elevated liver function tests 12/05/2016   DOE (dyspnea on exertion) 07/19/2016   Thalassemia minor 05/29/2016   Left bundle branch block 12/06/2015   Upper airway cough syndrome 02/58/5277   Diastolic heart failure ([place]) 10/10/2015   Myalgia 02/17/2014   Carotid artery stenosis 06/05/2013   Eustachian tube dysfunction 05/07/2013   Neuropathy of leg 03/07/2012   Anemia, unspecified 01/31/2012   Hot flashes 08/24/2010   Diabetes mellitus due to underlying condition, uncontrolled 06/30/2010   Generalized abdominal pain 03/24/2010   Obesity (BMI 30.0-34.9) 12/21/2009   IBS (irritable bowel syndrome) 04/29/2009   Vitamin D deficiency 03/10/2009   Acute bronchitis 03/07/2009   Hypothyroidism 12/13/2008   Dyslipidemia 12/13/2008   Anxiety state 12/13/2008   Other specified disorders of bladder 12/13/2008    Alda Lea, PT 11/14/2021, 8:55 PM  [facility] [address] [place], [place], [address] Phone: [phone]   Fax:  [phone]  Name: Breanna Webster MRN: [id number] Date of Birth: [birth year]

## 2021-11-14 NOTE — Telephone Encounter (Signed)
Re:  Medication Cost Pt is asking for a call back. [phone]

## 2021-11-15 DIAGNOSIS — M1711 Unilateral primary osteoarthritis, right knee: Secondary | ICD-10-CM | POA: Diagnosis not present

## 2021-11-15 NOTE — Telephone Encounter (Signed)
Noted. FYI 

## 2021-11-15 NOTE — Telephone Encounter (Signed)
Pt returned call. Pt states she will be leaving home today around 12 noon

## 2021-11-15 NOTE — Telephone Encounter (Signed)
Called pt and her spouse stated she is still sleeping. He will have pt return call.

## 2021-11-15 NOTE — Telephone Encounter (Signed)
Called patient back about price of Ozempic with current insurance. Patient is in the donut hole and would be responsible for paying > $200 for a 30 days supply at the pharmacy. Patient does not want to pay this but will discuss with her endocrinologist.

## 2021-11-16 ENCOUNTER — Encounter: Payer: Self-pay | Admitting: Adult Health

## 2021-11-16 ENCOUNTER — Telehealth: Payer: Self-pay | Admitting: Interventional Cardiology

## 2021-11-16 ENCOUNTER — Ambulatory Visit (INDEPENDENT_AMBULATORY_CARE_PROVIDER_SITE_OTHER): Payer: Medicare Other | Admitting: Adult Health

## 2021-11-16 VITALS — BP 110/80 | HR 78 | Temp 98.6°F | Ht 65.0 in | Wt 179.0 lb

## 2021-11-16 DIAGNOSIS — R600 Localized edema: Secondary | ICD-10-CM | POA: Diagnosis not present

## 2021-11-16 DIAGNOSIS — R350 Frequency of micturition: Secondary | ICD-10-CM

## 2021-11-16 DIAGNOSIS — I2510 Atherosclerotic heart disease of native coronary artery without angina pectoris: Secondary | ICD-10-CM | POA: Diagnosis not present

## 2021-11-16 DIAGNOSIS — R1032 Left lower quadrant pain: Secondary | ICD-10-CM

## 2021-11-16 DIAGNOSIS — I5032 Chronic diastolic (congestive) heart failure: Secondary | ICD-10-CM

## 2021-11-16 LAB — URINALYSIS
Bilirubin Urine: NEGATIVE
Hgb urine dipstick: NEGATIVE
Ketones, ur: NEGATIVE
Leukocytes,Ua: NEGATIVE
Nitrite: NEGATIVE
Specific Gravity, Urine: 1.01 (ref 1.000–1.030)
Total Protein, Urine: NEGATIVE
Urine Glucose: NEGATIVE
Urobilinogen, UA: 0.2 (ref 0.0–1.0)
pH: 6 (ref 5.0–8.0)

## 2021-11-16 NOTE — Progress Notes (Signed)
Subjective:    Patient ID: [name], female    DOB: Nov 15, 1945, 76 y.o.   MRN: [id number]  HPI 76 year old female female who  has a past medical history of Anxiety, Arthritis, Back pain, Blood transfusion without reported diagnosis, CAD (coronary artery disease), Carotid artery stenosis, Chest pain, Chronic lower back pain, Cirrhosis ([place]), Colon polyps, Diverticulitis, Diverticulosis, Esophageal thickening, Fatty liver, GERD (gastroesophageal reflux disease), Grave's disease, History of colonic polyps (05/22/2017), History of hiatal hernia, Hypertension, Hypothyroidism, IBS (irritable bowel syndrome), Osteopenia, Pulmonary nodules, S/P TAVR (transcatheter aortic valve replacement), Severe aortic stenosis, Shortness of breath on exertion, Stroke ([place]), Thalassemia minor, Thyroid disease, and Type II diabetes mellitus ([place]).  She was seen by another provider in the office roughly 8 days ago complaining of a week of dysuria with worsening urinary frequency and cloudy urine. X 5 days   At this time she was also complaining of severe LLQ abdominal pain that was intermittent and non radiating. She was not having any bowel movements or blood in her stool   Her urine dipstick showed moderate amount of leukocytes and positive protein. She was started on Macrobid and culture was sent with came back showing E.Coli bacteria that was sensitive  to macrobid   Today she reports that she finished the antibiotic and does not have as much dysuria but it is still there, she continues to have frequency or urgency. She denies, fevers or chills.   She also continues to have left lower abdominal pain intermittently. She did have a CT abdomen in December 2022 which showed   1. No acute abdominal/pelvic findings, mass lesions or adenopathy. 2. Stable 12 mm peritoneal nodule in the left abdomen. 3. Stable changes of cirrhosis.  She also reports that she was seen by orthopedics yesterday that they were concerned  about the edema in her lower extremities. She has made an appointment with cardiology. She has not taken lasix in the last few days   Review of Systems See HPI   Past Medical History:  Diagnosis Date   Anxiety    Arthritis    "back" (04/22/2018)   Back pain    Blood transfusion without reported diagnosis    CAD (coronary artery disease)    a. 03/2018 s/p PCI/DES to the RCA (3.0x15 Onyx DES).   Carotid artery stenosis    Mild   Chest pain    Chronic lower back pain    Cirrhosis (HCC)    Colon polyps    Diverticulitis    Diverticulosis    Esophageal thickening    seen on pre TAVR CT scan, also questionable cirrhosis. MRI recommended. Will refer to GI   Fatty liver    GERD (gastroesophageal reflux disease)    Grave's disease    History of colonic polyps 05/22/2017   History of hiatal hernia    Hypertension    Hypothyroidism    IBS (irritable bowel syndrome)    Osteopenia    Pulmonary nodules    seen on pre TAVR CT. likley benign. no follow up recommended if pt low risk.   S/P TAVR (transcatheter aortic valve replacement)    Severe aortic stenosis    Shortness of breath on exertion    Stroke (HCC)    Thalassemia minor    Thyroid disease    Type II diabetes mellitus ([place])     Social History   Socioeconomic History   Marital status: Married    Spouse name: Not on file  Number of children: 2   Years of education: Not on file   Highest education level: Not on file  Occupational History   Occupation: Tree surgeon of the [employer]  Tobacco Use   Smoking status: Never   Smokeless tobacco: Never  Vaping Use   Vaping Use: Never used  Substance and Sexual Activity   Alcohol use: No    Alcohol/week: 0.0 standard drinks of alcohol   Drug use: Never   Sexual activity: Not Currently  Other Topics Concern   Not on file  Social History Narrative   Lives with husband in a one story home.     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   Education: college.   Social Determinants of Health   Financial Resource Strain: Medium Risk (11/13/2021)   Overall Financial Resource Strain (CARDIA)    Difficulty of Paying Living Expenses: Somewhat hard  Food Insecurity: No Food Insecurity (04/03/2021)   Hunger Vital Sign    Worried About Running Out of Food in the Last Year: Never true    Ran Out of Food in the Last Year: Never true  Transportation Needs: No Transportation Needs (04/03/2021)   PRAPARE - Hydrologist (Medical): No    Lack of Transportation (Non-Medical): No  Physical Activity: Inactive (04/03/2021)   Exercise Vital Sign    Days of Exercise per Week: 0 days    Minutes of Exercise per Session: 0 min  Stress: No Stress Concern Present (04/03/2021)   Guadalupe Guerra    Feeling of Stress : Not at all  Social Connections: Nina (04/03/2021)   Social Connection and Isolation Panel [NHANES]    Frequency of Communication with Friends and Family: More than three times a week    Frequency of Social Gatherings with Friends and Family: More than three times a week    Attends Religious Services: More than 4 times per year    Active Member of Clubs or Organizations: Yes    Attends Archivist Meetings: More than 4 times per year    Marital Status: Married  Human resources officer Violence: Not At Risk (04/03/2021)   Humiliation, Afraid, Rape, and Kick questionnaire    Fear of Current or Ex-Partner: No    Emotionally Abused: No    Physically Abused: No    Sexually Abused: No    Past Surgical History:  Procedure Laterality Date   101 HOUR [place] STUDY N/A 03/03/2018   Procedure: 24 HOUR [place] STUDY;  Surgeon: Mauri Pole, MD;  Location: [facility];  Service: Endoscopy;  Laterality: N/A;   AORTIC VALVE REPLACEMENT  06/2018   COLONOSCOPY     COLONOSCOPY W/ BIOPSIES  AND POLYPECTOMY     CORONARY ANGIOGRAPHY Right 04/21/2018   Procedure: CORONARY ANGIOGRAPHY (CATH LAB);  Surgeon: [name], MD;  Location: [facility];  Service: Cardiovascular;  Laterality: Right;   CORONARY STENT INTERVENTION N/A 04/22/2018   Procedure: CORONARY STENT INTERVENTION;  Surgeon: [name], MD;  Location: [facility];  Service: Cardiovascular;  Laterality: N/A;   DILATION AND CURETTAGE OF UTERUS     ESOPHAGEAL MANOMETRY N/A 03/03/2018   Procedure: ESOPHAGEAL MANOMETRY (EM);  Surgeon: Mauri Pole, MD;  Location: [facility];  Service: Endoscopy;  Laterality: N/A;   GASTRIC FUNDOPLICATION     HERNIA REPAIR     HYSTEROSCOPY  fibroids   LAPAROSCOPIC CHOLECYSTECTOMY     LAPAROSCOPY     fibroids   NISSEN FUNDOPLICATION  8676H   POLYPECTOMY     RIGHT/LEFT HEART CATH AND CORONARY ANGIOGRAPHY N/A 02/20/2018   Procedure: RIGHT/LEFT HEART CATH AND CORONARY ANGIOGRAPHY;  Surgeon: [name], MD;  Location: [facility];  Service: Cardiovascular;  Laterality: N/A;   TEE WITHOUT CARDIOVERSION N/A 07/08/2018   Procedure: TRANSESOPHAGEAL ECHOCARDIOGRAM (TEE);  Surgeon: Burnell Blanks, MD;  Location: [facility];  Service: Open Heart Surgery;  Laterality: N/A;   TEE WITHOUT CARDIOVERSION  10/07/2018   TEE WITHOUT CARDIOVERSION N/A 10/07/2018   Procedure: TRANSESOPHAGEAL ECHOCARDIOGRAM (TEE);  Surgeon: Jerline Pain, MD;  [facility];  Service: Cardiovascular;  Laterality: N/A;   TONSILLECTOMY     TRANSCATHETER AORTIC VALVE REPLACEMENT, TRANSFEMORAL N/A 07/08/2018   Procedure: TRANSCATHETER AORTIC VALVE REPLACEMENT, TRANSFEMORAL;  Surgeon: Burnell Blanks, MD;  Location: [facility];  Service: Open Heart Surgery;  Laterality: N/A;    Family History  Adopted: Yes  Problem Relation Age of Onset   Healthy Son        x 2   Headache Other        Cluster headaches   Heart failure Mother    Colon cancer Neg Hx    Pancreatic  cancer Neg Hx    Rectal cancer Neg Hx    Stomach cancer Neg Hx     Allergies  Allergen Reactions   Statins Other (See Comments)    Muscle aches    Current Outpatient Medications on File Prior to Visit  Medication Sig Dispense Refill   apixaban (ELIQUIS) 5 MG TABS tablet Take 1 tablet (5 mg total) by mouth 2 (two) times daily. 180 tablet 3   b complex vitamins capsule Take 1 capsule by mouth daily.     BD PEN NEEDLE NANO 2ND GEN 32G X 4 MM MISC      cholecalciferol (VITAMIN D) 25 MCG (1000 UNIT) tablet Take 1,000 Units by mouth daily.     diltiazem (CARDIZEM CD) 120 MG 24 hr capsule Take 1 capsule (120 mg total) by mouth daily. 90 capsule 1   escitalopram (LEXAPRO) 20 MG tablet TAKE ONE TABLET BY MOUTH DAILY 90 tablet 1   estradiol (ESTRACE) 0.1 MG/GM vaginal cream Place vaginally.     Evolocumab (REPATHA SURECLICK) 209 MG/ML SOAJ Inject 1 dose into the skin every 14 (fourteen) days. 2 mL 11   furosemide (LASIX) 40 MG tablet Take 1 tablet (40 mg total) by mouth daily. 10 tablet 0   glucose blood (ONETOUCH VERIO) test strip USE TO CHECK BLOOD SUGAR THREE TIMES DAILY 300 strip 4   Insulin NPH, Human,, Isophane, (HUMULIN N KWIKPEN) 100 UNIT/ML Kiwkpen Inject 28 Units into the skin daily. 15 mL 0   Insulin Pen Needle (UNIFINE PENTIPS) 32G X 4 MM MISC Inject 4 times subcutaneously 400 each 2   insulin regular (HUMULIN R) 100 units/mL injection Inject 0.1 mLs (10 Units total) into the skin 3 (three) times daily before meals. 10 mL 0   Insulin Syringe-Needle U-100 (ULTICARE INSULIN SYRINGE) 31G X 5/16" 1 ML MISC Inject 4 times daily subcutaneously 100 each 1   Lancets (ONETOUCH DELICA PLUS [id number]) MISC Use twice daily 200 each 1   levothyroxine (SYNTHROID) 125 MCG tablet Take 1 tablet by mouth in the morning on an empty stomach once a day. 90 days 90 tablet 0   losartan (COZAAR) 50 MG tablet TAKE ONE TABLET  BY MOUTH DAILY 90 tablet 1   metFORMIN (GLUCOPHAGE-XR) 500 MG 24 hr tablet Take 1  tablet by mouth 2 times daily 180 tablet 2   Multiple Vitamin (MULITIVITAMIN WITH MINERALS) TABS Take 1 tablet by mouth daily.     nitroGLYCERIN (NITROSTAT) 0.4 MG SL tablet Place 1 tablet (0.4 mg total) under the tongue every 5 (five) minutes as needed for chest pain. 25 tablet 4   NOVOLIN N 100 UNIT/ML injection Inject 28 Units into the skin daily before breakfast.     NOVOLIN R 100 UNIT/ML injection 8 Units 2 (two) times daily before a meal. 10 UNITS THIRTY MINUTES BEFORE MEALS.  5 ADDITIONAL UNITS WITH CARBS OR SNACKS. (04/26/2021:  Pt reports she only takes the 8 units 2x/daily before meals, not the 10 units)     omeprazole (PRILOSEC) 20 MG capsule Take 1 capsule (20 mg total) by mouth 2 (two) times daily before a meal. 60 capsule 3   rOPINIRole (REQUIP) 0.25 MG tablet Take 1 tablet (0.25 mg total) by mouth 3 (three) times daily. 90 tablet 5   Vibegron (GEMTESA) 75 MG TABS 1 tablet PO QD 30 tablet 0   No current facility-administered medications on file prior to visit.    BP 110/80   Pulse 78   Temp 98.6 F (37 C) (Oral)   Ht 5' 5"  (1.651 m)   Wt 179 lb (81.2 kg)   SpO2 98%   BMI 29.79 kg/m       Objective:   Physical Exam Vitals and nursing note reviewed.  Constitutional:      Appearance: Normal appearance.  Cardiovascular:     Rate and Rhythm: Normal rate and regular rhythm.     Pulses: Normal pulses.     Heart sounds: Normal heart sounds.  Pulmonary:     Effort: Pulmonary effort is normal.     Breath sounds: Normal breath sounds.  Musculoskeletal:     Right lower leg: Edema (trace pitting) present.     Left lower leg: Edema (trace pitting) present.  Skin:    General: Skin is warm and dry.     Capillary Refill: Capillary refill takes less than 2 seconds.  Neurological:     General: No focal deficit present.     Mental Status: She is alert and oriented to person, place, and time.  Psychiatric:        Mood and Affect: Mood normal.        Behavior: Behavior normal.         Thought Content: Thought content normal.        Judgment: Judgment normal.       Assessment & Plan:  1. Frequent urination - Recheck urine culture and UA - Urinalysis; Future - Culture, Urine; Future - Culture, Urine - Urinalysis  2. LLQ abdominal pain - not tender on exam today  - Will hold off on advanced imaging   3. Lower extremity edema - she has very trace pitting edema today. Her legs look pretty good compared to what we have seen in the past.   [name], NP

## 2021-11-16 NOTE — Telephone Encounter (Signed)
Pt c/o swelling: STAT is pt has developed SOB within 24 hours  If swelling, where is the swelling located? Both legs and ankles are swollen  How much weight have you gained and in what time span? No   Have you gained 3 pounds in a day or 5 pounds in a week?doesn't think so   Do you have a log of your daily weights (if so, list)? no  Are you currently taking a fluid pill? no  Are you currently SOB? no  Have you traveled recently? no

## 2021-11-16 NOTE — Telephone Encounter (Signed)
Returned call to patient. Patient states she has been experiencing swelling in both legs and ankles. She states she was seen by Dr. Wynelle Link yesterday at [facility] and was told she has "a lot of fluid on her legs" and needs to follow-up with cardiology for concern for heart failure.  Patient denies increased sodium in diet.  Patient denies any SOB. She reports swelling started about 3 weeks ago. Patient states she had been given a prescription for Lasix 31m x3 doses only by CDorothyann Peng NP (PCP) but does not have any more.  She reports the Lasix helped the swelling somewhat, but has "come right back." Patient requested to see Dr. STamala Julianas soon as possible for assessment. Offered available appt for 7/31 at 2:30PM, patient accepted.

## 2021-11-17 ENCOUNTER — Other Ambulatory Visit (HOSPITAL_BASED_OUTPATIENT_CLINIC_OR_DEPARTMENT_OTHER): Payer: Self-pay

## 2021-11-17 LAB — URINE CULTURE
MICRO NUMBER:: 13703084
SPECIMEN QUALITY:: ADEQUATE

## 2021-11-17 MED ORDER — FUROSEMIDE 40 MG PO TABS
40.0000 mg | ORAL_TABLET | Freq: Every day | ORAL | 0 refills | Status: DC
  Filled 2021-11-17: qty 10, 10d supply, fill #0

## 2021-11-17 NOTE — Telephone Encounter (Signed)
Left message for patient with Dr. Thompson Caul recommendation:  Resume Lasix 40 mg/day until we see her.  We should get a basic metabolic panel at that time.   [facility]   [place] [facility] [facility].

## 2021-11-17 NOTE — Telephone Encounter (Signed)
Received return call from patient and reviewed recommendations from Dr. Tamala Julian.  Patient agreeable and verbalized understanding. Confirmed appt with Dr. Tamala Julian for 7/31 at 2:30PM.  Patient expressed appreciation for call.

## 2021-11-19 NOTE — Progress Notes (Unsigned)
Cardiology Office Note:    Date:  11/20/2021   ID:  Breanna Webster, DOB [birth year], MRN [id number]  PCP:  [name], NP  Cardiologist:  Sinclair Grooms, MD   Referring MD: [name], NP   Chief Complaint  Patient presents with   Cardiac Valve Problem    Status post TAVR 2020   Congestive Heart Failure    Lower extremity edema    History of Present Illness:    Breanna Webster is a 76 y.o. female with a hx of  DM-2, Graves disease/hypothyroidism, NASH with cirrhosis, hypothyroidism,  Primary HTN, chronic diastolic HF, calcified mitral annulus, pulm HTN, CAD s/p PCI/DES x1 to mRCA (03/2018), mitral valve endocarditis 05/7060, PAF, embolic cerebellar CVA 06/7626, thalassemia, NASH cirrhosis, iron deficiency anemia, and severe AS now s/p TAVR (06/2018).   Recent LE swelling and SOB.  She states that the lower extremity swelling occurs intermittently over several months.  It will come and go on its own.  She has intermittent chest tightness.  She has had this off and on over the years.  She did not think much of it.  She does have a history of CAD and had a right coronary stent implantation in 2019 prior to TAVR.  The RCA lesion was found coincidentally.  She saw Dr. Wynelle Link for an orthopedic related issue when he noted lower extremity edema.  He had her see primary care, Adriana Mccallum, NP.  Lasix was started for 3 days.  Edema got better.  It then started to recur.  We asked her to resume the diuretic through the weekend.  The lower extremity swelling is again better today.  Past Medical History:  Diagnosis Date   Anxiety    Arthritis    "back" (04/22/2018)   Back pain    Blood transfusion without reported diagnosis    CAD (coronary artery disease)    a. 03/2018 s/p PCI/DES to the RCA (3.0x15 Onyx DES).   Carotid artery stenosis    Mild   Chest pain    Chronic lower back pain    Cirrhosis (HCC)    Colon polyps    Diverticulitis    Diverticulosis    Esophageal  thickening    seen on pre TAVR CT scan, also questionable cirrhosis. MRI recommended. Will refer to GI   Fatty liver    GERD (gastroesophageal reflux disease)    Grave's disease    History of colonic polyps 05/22/2017   History of hiatal hernia    Hypertension    Hypothyroidism    IBS (irritable bowel syndrome)    Osteopenia    Pulmonary nodules    seen on pre TAVR CT. likley benign. no follow up recommended if pt low risk.   S/P TAVR (transcatheter aortic valve replacement)    Severe aortic stenosis    Shortness of breath on exertion    Stroke ([place])    Thalassemia minor    Thyroid disease    Type II diabetes mellitus ([place])     Past Surgical History:  Procedure Laterality Date   13 HOUR [place] STUDY N/A 03/03/2018   Procedure: 24 HOUR PH STUDY;  Surgeon: Mauri Pole, MD;  Location: [facility];  Service: Endoscopy;  Laterality: N/A;   AORTIC VALVE REPLACEMENT  06/2018   COLONOSCOPY     COLONOSCOPY W/ BIOPSIES AND POLYPECTOMY     CORONARY ANGIOGRAPHY Right 04/21/2018   Procedure: CORONARY ANGIOGRAPHY (CATH LAB);  Surgeon: [name], MD;  Location:  [facility];  Service: Cardiovascular;  Laterality: Right;   CORONARY STENT INTERVENTION N/A 04/22/2018   Procedure: CORONARY STENT INTERVENTION;  Surgeon: [name], MD;  Location: [facility];  Service: Cardiovascular;  Laterality: N/A;   DILATION AND CURETTAGE OF UTERUS     ESOPHAGEAL MANOMETRY N/A 03/03/2018   Procedure: ESOPHAGEAL MANOMETRY (EM);  Surgeon: Mauri Pole, MD;  Location: [facility];  Service: Endoscopy;  Laterality: N/A;   GASTRIC FUNDOPLICATION     HERNIA REPAIR     HYSTEROSCOPY     fibroids   LAPAROSCOPIC CHOLECYSTECTOMY     LAPAROSCOPY     fibroids   NISSEN FUNDOPLICATION  9811B   POLYPECTOMY     RIGHT/LEFT HEART CATH AND CORONARY ANGIOGRAPHY N/A 02/20/2018   Procedure: RIGHT/LEFT HEART CATH AND CORONARY ANGIOGRAPHY;  Surgeon: [name], MD;  Location: [facility]  CV [facility];  Service: Cardiovascular;  Laterality: N/A;   TEE WITHOUT CARDIOVERSION N/A 07/08/2018   Procedure: TRANSESOPHAGEAL ECHOCARDIOGRAM (TEE);  Surgeon: Burnell Blanks, MD;  Location: [facility];  Service: Open Heart Surgery;  Laterality: N/A;   TEE WITHOUT CARDIOVERSION  10/07/2018   TEE WITHOUT CARDIOVERSION N/A 10/07/2018   Procedure: TRANSESOPHAGEAL ECHOCARDIOGRAM (TEE);  Surgeon: Jerline Pain, MD;  Location: [facility];  Service: Cardiovascular;  Laterality: N/A;   TONSILLECTOMY     TRANSCATHETER AORTIC VALVE REPLACEMENT, TRANSFEMORAL N/A 07/08/2018   Procedure: TRANSCATHETER AORTIC VALVE REPLACEMENT, TRANSFEMORAL;  Surgeon: Burnell Blanks, MD;  Location: [facility];  Service: Open Heart Surgery;  Laterality: N/A;    Current Medications: Current Meds  Medication Sig   apixaban (ELIQUIS) 5 MG TABS tablet Take 1 tablet (5 mg total) by mouth 2 (two) times daily.   b complex vitamins capsule Take 1 capsule by mouth daily.   BD PEN NEEDLE NANO 2ND GEN 32G X 4 MM MISC    cholecalciferol (VITAMIN D) 25 MCG (1000 UNIT) tablet Take 1,000 Units by mouth daily.   diltiazem (CARDIZEM CD) 120 MG 24 hr capsule Take 1 capsule (120 mg total) by mouth daily.   escitalopram (LEXAPRO) 20 MG tablet TAKE ONE TABLET BY MOUTH DAILY   Evolocumab (REPATHA SURECLICK) 147 MG/ML SOAJ Inject 1 dose into the skin every 14 (fourteen) days.   glucose blood (ONETOUCH VERIO) test strip USE TO CHECK BLOOD SUGAR THREE TIMES DAILY   Insulin NPH, Human,, Isophane, (HUMULIN N KWIKPEN) 100 UNIT/ML Kiwkpen Inject 28 Units into the skin daily.   Insulin Pen Needle (UNIFINE PENTIPS) 32G X 4 MM MISC Inject 4 times subcutaneously   insulin regular (HUMULIN R) 100 units/mL injection Inject 0.1 mLs (10 Units total) into the skin 3 (three) times daily before meals.   Insulin Syringe-Needle U-100 (ULTICARE INSULIN SYRINGE) 31G X 5/16" 1 ML MISC Inject 4 times daily subcutaneously   Lancets (ONETOUCH DELICA PLUS  [id number]) MISC Use twice daily   levothyroxine (SYNTHROID) 125 MCG tablet Take 1 tablet by mouth in the morning on an empty stomach once a day. 90 days   losartan (COZAAR) 50 MG tablet TAKE ONE TABLET BY MOUTH DAILY   metFORMIN (GLUCOPHAGE-XR) 500 MG 24 hr tablet Take 1 tablet by mouth 2 times daily   Multiple Vitamin (MULITIVITAMIN WITH MINERALS) TABS Take 1 tablet by mouth daily.   nitroGLYCERIN (NITROSTAT) 0.4 MG SL tablet Place 1 tablet (0.4 mg total) under the tongue every 5 (five) minutes as needed for chest pain.   NOVOLIN N 100 UNIT/ML injection Inject 28 Units  into the skin daily before breakfast.   NOVOLIN R 100 UNIT/ML injection 8 Units 2 (two) times daily before a meal. 10 UNITS THIRTY MINUTES BEFORE MEALS.  5 ADDITIONAL UNITS WITH CARBS OR SNACKS. (04/26/2021:  Pt reports she only takes the 8 units 2x/daily before meals, not the 10 units)   omeprazole (PRILOSEC) 20 MG capsule Take 1 capsule (20 mg total) by mouth 2 (two) times daily before a meal.   rOPINIRole (REQUIP) 0.25 MG tablet Take 1 tablet (0.25 mg total) by mouth 3 (three) times daily.   Vibegron (GEMTESA) 75 MG TABS 1 tablet PO QD   [DISCONTINUED] furosemide (LASIX) 40 MG tablet Take 1 tablet (40 mg total) by mouth daily.     Allergies:   Statins   Social History   Socioeconomic History   Marital status: Married    Spouse name: Not on file   Number of children: 2   Years of education: Not on file   Highest education level: Not on file  Occupational History   Occupation: Tree surgeon of the [employer]  Tobacco Use   Smoking status: Never   Smokeless tobacco: Never  Vaping Use   Vaping Use: Never used  Substance and Sexual Activity   Alcohol use: No    Alcohol/week: 0.0 standard drinks of alcohol   Drug use: Never   Sexual activity: Not Currently  Other Topics Concern   Not on file  Social History Narrative   Lives with husband in a one story home.     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   Education: college.   Social Determinants of Health   Financial Resource Strain: Medium Risk (11/13/2021)   Overall Financial Resource Strain (CARDIA)    Difficulty of Paying Living Expenses: Somewhat hard  Food Insecurity: No Food Insecurity (04/03/2021)   Hunger Vital Sign    Worried About Running Out of Food in the Last Year: Never true    Ran Out of Food in the Last Year: Never true  Transportation Needs: No Transportation Needs (04/03/2021)   PRAPARE - Hydrologist (Medical): No    Lack of Transportation (Non-Medical): No  Physical Activity: Inactive (04/03/2021)   Exercise Vital Sign    Days of Exercise per Week: 0 days    Minutes of Exercise per Session: 0 min  Stress: No Stress Concern Present (04/03/2021)   [place]    Feeling of Stress : Not at all  Social Connections: [place] (04/03/2021)   Social Connection and Isolation Panel [NHANES]    Frequency of Communication with Friends and Family: More than three times a week    Frequency of Social Gatherings with Friends and Family: More than three times a week    Attends Religious Services: More than 4 times per year    Active Member of [employer] or Organizations: Yes    Attends Music therapist: More than 4 times per year    Marital Status: Married     Family History: The patient's family history includes Headache in an other family member; Healthy in her son; Heart failure in her mother. There is no history of Colon cancer, Pancreatic cancer, Rectal cancer, or Stomach cancer. She was adopted.  ROS:   Please see the history of present illness.    No new complaints other than the chest discomfort as noted  above.  All other systems reviewed and are negative.  EKGs/Labs/Other Studies Reviewed:    The following studies were reviewed  today: No recent imaging.  Last echocardiogram March 21, 2021: IMPRESSIONS     1. Left ventricular ejection fraction, by estimation, is 60 to 65%. The  left ventricle has normal function. The left ventricle has no regional  wall motion abnormalities. There is mild left ventricular hypertrophy.  Left ventricular diastolic parameters  are consistent with Grade I diastolic dysfunction (impaired relaxation).  The average left ventricular global longitudinal strain is -19.9 %. The  global longitudinal strain is normal.   2. Right ventricular systolic function is normal. The right ventricular  size is normal.   3. Left atrial size was moderately dilated.   4. The mitral valve is normal in structure. No evidence of mitral valve  regurgitation. Mild mitral stenosis. The mean mitral valve gradient is 4.5  mmHg. Severe mitral annular calcification.   5. The aortic valve has been repaired/replaced. Aortic valve  regurgitation is not visualized. No aortic stenosis is present. There is a  23 mm Edwards Sapien prosthetic (TAVR) valve present in the aortic  position. Echo findings are consistent with normal   structure and function of the aortic valve prosthesis. Aortic valve area,  by VTI measures 1.99 cm. Aortic valve mean gradient measures 15.0 mmHg.  Aortic valve Vmax measures 2.43 m/s.   6. The inferior vena cava is normal in size with greater than 50%  respiratory variability, suggesting right atrial pressure of 3 mmHg.   Comparison(s): No significant change from prior study. Prior images  reviewed side by side.  EKG:  EKG sinus rhythm with left bundle branch block.  QRSD 160 ms.  Compared to prior EKG on 03/21/2021, no significant changes noted  Recent Labs: 12/05/2020: TSH 1.44 03/13/2021: Pro B Natriuretic peptide (BNP) 79.0 09/22/2021: ALT 14; BUN 15; Creatinine, Ser 0.73; Hemoglobin 10.9; Platelets 173.0; Potassium 4.0; Sodium 136  Recent Lipid Panel    Component Value Date/Time    CHOL 106 09/06/2021 1018   CHOL 156 11/21/2016 1113   TRIG 101 09/06/2021 1018   HDL 50 09/06/2021 1018   HDL 44 11/21/2016 1113   CHOLHDL 2.1 09/06/2021 1018   VLDL 20 09/06/2021 1018   LDLCALC 36 09/06/2021 1018   LDLCALC 91 11/21/2016 1113   LDLDIRECT 98.0 05/24/2014 1343    Physical Exam:    VS:  BP 130/64   Pulse 77   Ht 5' 5"  (1.651 m)   Wt 181 lb 3.2 oz (82.2 kg)   SpO2 98%   BMI 30.15 kg/m     Wt Readings from Last 3 Encounters:  11/20/21 181 lb 3.2 oz (82.2 kg)  11/16/21 179 lb (81.2 kg)  11/07/21 179 lb (81.2 kg)     GEN: Compatible with age. No acute distress HEENT: Normal NECK: No JVD. LYMPHATICS: No lymphadenopathy CARDIAC: 2-3/6 right upper sternal systolic without diastolic murmur. RRR no gallop, or edema. VASCULAR:  Normal Pulses. No bruits. RESPIRATORY:  Clear to auscultation without rales, wheezing or rhonchi  ABDOMEN: Soft, non-tender, non-distended, No pulsatile mass, MUSCULOSKELETAL: No deformity  SKIN: Warm and dry NEUROLOGIC:  Alert and oriented x 3 PSYCHIATRIC:  Normal affect   ASSESSMENT:    1. Chronic diastolic heart failure ([place])   2. S/P TAVR (transcatheter aortic valve replacement)   3. PAF (paroxysmal atrial fibrillation) ([place])   4. CAD in native artery   5. Essential hypertension   6. Cerebellar stroke, acute (  [place])   7. Dyslipidemia   8. Hyperlipidemia LDL goal <70   9. Left bundle branch block    PLAN:    In order of problems listed above:  We will hold Lasix, get a basic metabolic panel and BNP today.  She does have diastolic dysfunction on echo.  If fluid continues to reaccumulate, we will consider an SGLT2. TAVR valve with echo reevaluation 9 months ago did not reveal any significant abnormality. In sinus rhythm today. She has had recurring intermittent chest tightness off and on over years. Blood pressure today is 130/64.  Lasix will be stopped.  BNP and basic metabolic panel will be ordered. Not discussed Continue  Repatha. Unchanged.  Guideline directed therapy for left ventricular diastolic dysfunction: Mineralocorticoid receptor antagonist (MRA) therapy -spironolactone or eplerenone.  SGLT-2 agents -  Dapagliflozin [place]) or Empagliflozin ([place]).These therapies have been shown to improve clinical outcomes including reduction of rehospitalization, survival, and acute heart failure.  We will discontinue Lasix.  We will see what BNP and kidney function is is like.  Consider SGLT2.   Medication Adjustments/Labs and Tests Ordered: Current medicines are reviewed at length with the patient today.  Concerns regarding medicines are outlined above.  Orders Placed This Encounter  Procedures   Basic metabolic panel   Pro b natriuretic peptide (BNP)   EKG 12-Lead   No orders of the defined types were placed in this encounter.   Patient Instructions  Medication Instructions:  Your physician has recommended you make the following change in your medication:   1) STOP Furosemide (Lasix)  *If you need a refill on your cardiac medications before your next appointment, please call your pharmacy*  Lab Work: TODAY: BMET, BNP If you have labs (blood work) drawn today and your tests are completely normal, you will receive your results only by: [name] (if you have MyChart) OR A paper copy in the mail If you have any lab test that is abnormal or we need to change your treatment, we will call you to review the results.  Testing/Procedures: Your physician has requested that you have an echocardiogram. Echocardiography is a painless test that uses sound waves to create images of your heart. It provides your doctor with information about the size and shape of your heart and how well your heart's chambers and valves are working. This procedure takes approximately one hour. There are no restrictions for this procedure.  Follow-Up: At [facility], you and your health needs are our priority.  As part  of our continuing mission to provide you with exceptional heart care, we have created designated Provider Care Teams.  These Care Teams include your primary Cardiologist (physician) and Advanced Practice Providers (APPs -  Physician Assistants and Nurse Practitioners) who all work together to provide you with the care you need, when you need it.  Your next appointment:   4 month(s)  The format for your next appointment:   In Person  Provider:   Sinclair Grooms, MD {   Important Information About Sugar         Signed, Sinclair Grooms, MD  11/20/2021 3:19 PM    [place]

## 2021-11-20 ENCOUNTER — Encounter: Payer: Self-pay | Admitting: Interventional Cardiology

## 2021-11-20 ENCOUNTER — Ambulatory Visit: Payer: Medicare Other | Admitting: Physical Therapy

## 2021-11-20 ENCOUNTER — Ambulatory Visit (INDEPENDENT_AMBULATORY_CARE_PROVIDER_SITE_OTHER): Payer: Medicare Other | Admitting: Interventional Cardiology

## 2021-11-20 ENCOUNTER — Ambulatory Visit: Payer: Medicare Other

## 2021-11-20 VITALS — BP 130/64 | HR 77 | Ht 65.0 in | Wt 181.2 lb

## 2021-11-20 DIAGNOSIS — E785 Hyperlipidemia, unspecified: Secondary | ICD-10-CM | POA: Diagnosis not present

## 2021-11-20 DIAGNOSIS — Z8673 Personal history of transient ischemic attack (TIA), and cerebral infarction without residual deficits: Secondary | ICD-10-CM

## 2021-11-20 DIAGNOSIS — Z794 Long term (current) use of insulin: Secondary | ICD-10-CM

## 2021-11-20 DIAGNOSIS — I10 Essential (primary) hypertension: Secondary | ICD-10-CM

## 2021-11-20 DIAGNOSIS — I2510 Atherosclerotic heart disease of native coronary artery without angina pectoris: Secondary | ICD-10-CM

## 2021-11-20 DIAGNOSIS — I447 Left bundle-branch block, unspecified: Secondary | ICD-10-CM

## 2021-11-20 DIAGNOSIS — E1169 Type 2 diabetes mellitus with other specified complication: Secondary | ICD-10-CM

## 2021-11-20 DIAGNOSIS — I480 Paroxysmal atrial fibrillation: Secondary | ICD-10-CM

## 2021-11-20 DIAGNOSIS — Z952 Presence of prosthetic heart valve: Secondary | ICD-10-CM | POA: Diagnosis not present

## 2021-11-20 DIAGNOSIS — I5032 Chronic diastolic (congestive) heart failure: Secondary | ICD-10-CM

## 2021-11-20 DIAGNOSIS — I639 Cerebral infarction, unspecified: Secondary | ICD-10-CM

## 2021-11-20 NOTE — Chronic Care Management (AMB) (Signed)
Chronic Care Management   CCM RN Visit Note  11/20/2021 Name: Breanna Webster MRN: [id number] DOB: May 07, 1945  Subjective: Breanna Webster is a 76 y.o. year old female who is a primary care patient of Breanna Peng, NP. The care management team was consulted for assistance with disease management and care coordination needs.    Engaged with patient by telephone for follow up visit in response to provider referral for case management and/or care coordination services.   Consent to Services:  The patient was given information about Chronic Care Management services, agreed to services, and gave verbal consent prior to initiation of services.  Please see initial visit note for detailed documentation.   Patient agreed to services and verbal consent obtained.   Assessment: Review of patient past medical history, allergies, medications, health status, including review of consultants reports, laboratory and other test data, was performed as part of comprehensive evaluation and provision of chronic care management services.   SDOH (Social Determinants of Health) assessments and interventions performed:    CCM Care Plan  Allergies  Allergen Reactions   Statins Other (See Comments)    Muscle aches    Outpatient Encounter Medications as of 11/20/2021  Medication Sig   apixaban (ELIQUIS) 5 MG TABS tablet Take 1 tablet (5 mg total) by mouth 2 (two) times daily.   b complex vitamins capsule Take 1 capsule by mouth daily.   BD PEN NEEDLE NANO 2ND GEN 32G X 4 MM MISC    cholecalciferol (VITAMIN D) 25 MCG (1000 UNIT) tablet Take 1,000 Units by mouth daily.   diltiazem (CARDIZEM CD) 120 MG 24 hr capsule Take 1 capsule (120 mg total) by mouth daily.   escitalopram (LEXAPRO) 20 MG tablet TAKE ONE TABLET BY MOUTH DAILY   estradiol (ESTRACE) 0.1 MG/GM vaginal cream Place vaginally.   Evolocumab (REPATHA SURECLICK) 735 MG/ML SOAJ Inject 1 dose into the skin every 14 (fourteen) days.   furosemide  (LASIX) 40 MG tablet Take 1 tablet (40 mg total) by mouth daily.   glucose blood (ONETOUCH VERIO) test strip USE TO CHECK BLOOD SUGAR THREE TIMES DAILY   Insulin NPH, Human,, Isophane, (HUMULIN N KWIKPEN) 100 UNIT/ML Kiwkpen Inject 28 Units into the skin daily.   Insulin Pen Needle (UNIFINE PENTIPS) 32G X 4 MM MISC Inject 4 times subcutaneously   insulin regular (HUMULIN R) 100 units/mL injection Inject 0.1 mLs (10 Units total) into the skin 3 (three) times daily before meals.   Insulin Syringe-Needle U-100 (ULTICARE INSULIN SYRINGE) 31G X 5/16" 1 ML MISC Inject 4 times daily subcutaneously   Lancets (ONETOUCH DELICA PLUS [id number]) MISC Use twice daily   levothyroxine (SYNTHROID) 125 MCG tablet Take 1 tablet by mouth in the morning on an empty stomach once a day. 90 days   losartan (COZAAR) 50 MG tablet TAKE ONE TABLET BY MOUTH DAILY   metFORMIN (GLUCOPHAGE-XR) 500 MG 24 hr tablet Take 1 tablet by mouth 2 times daily   Multiple Vitamin (MULITIVITAMIN WITH MINERALS) TABS Take 1 tablet by mouth daily.   nitroGLYCERIN (NITROSTAT) 0.4 MG SL tablet Place 1 tablet (0.4 mg total) under the tongue every 5 (five) minutes as needed for chest pain.   NOVOLIN N 100 UNIT/ML injection Inject 28 Units into the skin daily before breakfast.   NOVOLIN R 100 UNIT/ML injection 8 Units 2 (two) times daily before a meal. 10 UNITS THIRTY MINUTES BEFORE MEALS.  5 ADDITIONAL UNITS WITH CARBS OR SNACKS. (04/26/2021:  Pt reports she only takes  the 8 units 2x/daily before meals, not the 10 units)   omeprazole (PRILOSEC) 20 MG capsule Take 1 capsule (20 mg total) by mouth 2 (two) times daily before a meal.   rOPINIRole (REQUIP) 0.25 MG tablet Take 1 tablet (0.25 mg total) by mouth 3 (three) times daily.   Vibegron (GEMTESA) 75 MG TABS 1 tablet PO QD   No facility-administered encounter medications on file as of 11/20/2021.    Patient Active Problem List   Diagnosis Date Noted   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[place])  10/25/2021   Left hemiparesis ([place]) 06/21/2021   Other specified hereditary hemolytic anemias ([place]) 06/21/2021   Parkinsonism ([place]) 06/21/2021   COVID-19 02/14/2021   Postviral fatigue syndrome 02/14/2021   Difficulty with adaptive servo-ventilation (ASV) use 02/14/2021   Sepsis secondary to UTI ([place]) 09/08/2020   Elevated ALT measurement 09/08/2020   History of CVA with residual deficit 09/08/2020   Hyperbilirubinemia 09/08/2020   Fatigue associated with anemia 08/03/2020   Cerebrovascular accident (CVA) due to embolism of right posterior cerebral artery ([place]) 08/03/2020   OSA treated with BiPAP 08/03/2020   Complex sleep apnea syndrome 08/03/2020   Treatment-emergent central sleep apnea 08/03/2020   Chronic intermittent hypoxia with obstructive sleep apnea 04/21/2020   OSA (obstructive sleep apnea) 04/21/2020   History of cardioembolic stroke 52/84/1324   Gait disturbance, post-stroke 03/29/2020   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 ([place]) 03/29/2020   Anxiety    RLQ abdominal pain 10/22/2019   Paroxysmal atrial fibrillation ([place]) 05/22/2019   Iron deficiency anemia 05/07/2019   Atrial fibrillation with RVR ([place]) 10/21/2018   Cerebellar stroke, acute ([place]) 10/21/2018   Streptococcal endocarditis    Endocarditis of mitral valve 10/07/2018   Bacteremia due to Streptococcus Salivarius 10/07/2018   Sepsis ([place]) 40/01/2724   Acute metabolic encephalopathy 36/64/4034   Severe aortic stenosis 07/08/2018   S/P TAVR (transcatheter aortic valve replacement) 07/08/2018   Esophageal thickening    CAD in native artery 04/22/2018   Gastroesophageal reflux disease    Pulmonary hypertension ([place]) 02/21/2018   Essential hypertension 07/15/2017   History of colonic polyps 05/22/2017   Elevated liver function tests 12/05/2016   DOE (dyspnea on exertion) 07/19/2016   Thalassemia minor 05/29/2016   Left bundle branch block 12/06/2015   Upper airway cough syndrome 74/25/9563    Diastolic heart failure ([place]) 10/10/2015   Myalgia 02/17/2014   Carotid artery stenosis 06/05/2013   Eustachian tube dysfunction 05/07/2013   Neuropathy of leg 03/07/2012   Anemia, unspecified 01/31/2012   Hot flashes 08/24/2010   Diabetes mellitus due to underlying condition, uncontrolled 06/30/2010   Generalized abdominal pain 03/24/2010   Obesity (BMI 30.0-34.9) 12/21/2009   IBS (irritable bowel syndrome) 04/29/2009   Vitamin D deficiency 03/10/2009   Acute bronchitis 03/07/2009   Hypothyroidism 12/13/2008   Dyslipidemia 12/13/2008   Anxiety state 12/13/2008   Other specified disorders of bladder 12/13/2008    Conditions to be addressed/monitored:Atrial Fibrillation, CAD, HTN, and DMII  Care Plan : RN Care Manager Plan of Care  Updates made by [name], RN since 11/20/2021 12:00 AM     Problem: Chronic Disease Management and Care Coordination Needs (DM2, CAD, Afib,HTN. Hx CVA ) Resolved 11/20/2021  Priority: High     Long-Range Goal: Establish Plan of Care for Chronic Disease Management Needs (DM2, CAD, Afib,HTN. Hx CVA ) Completed 11/20/2021  Start Date: 03/09/2021  Expected End Date: 07/20/2022  Recent Progress: On track  Priority: High  Note:   Current  Barriers:  Care Coordination needs related to Financial constraints related to cost of medications Chronic Disease Management support and education needs related to Atrial Fibrillation, CAD, HTN, DMII, GERD, Overactive Bladder, and hx CVA Pt states she is still going to water therapy and she states she feels better when she is in the water. States she is  joining [employer] and plans to do water exercises there.  States she saw Dr. Wynelle Link and she got injections in her knees. States she will need to get different gel shots at some time  States she is to see cardiology today about her leg swelling.  States she is wearing compression hose. States she does have to get up 1-2 times a night to urinate.   Denies any chest  pains but does get winded with exertion.  States  she is to see a new endocrinologist PA next week.  States her CBGs had been AM ranges of 150-160 and normal later in the day.  Denies any recent hypoglycemia. States she has not been checking her B/P at home as she has checked at her doctors appointments RNCM Clinical Goal(s):  Patient will verbalize understanding of plan for management of Atrial Fibrillation, CAD, HTN, DMII, GERD, Overactive Bladder, and hx CVA as evidenced by voiced adherence to plan of care verbalize basic understanding of  Atrial Fibrillation, CAD, HTN, DMII, GERD, Overactive Bladder, and hx CVA disease process and self health management plan as evidenced by voiced understanding and teach back take all medications exactly as prescribed and will call provider for medication related questions as evidenced by dispense report and pt verbalization attend all scheduled medical appointments:  Cardiology 11/20/21 CCM PharmD 03/07/22, Annual Wellness Visit 04/05/22 as evidenced by medical records demonstrate Improved adherence to prescribed treatment plan for Atrial Fibrillation, CAD, HTN, DMII, GERD, Overactive Bladder, and hx CVA as evidenced by readings within limits, voiced adherence to plan of care continue to work with RN Care Manager to address care management and care coordination needs related to  Atrial Fibrillation, CAD, HTN, DMII, GERD, Overactive Bladder, and hx CVA as evidenced by adherence to CM Team Scheduled appointments work with pharmacist to address Financial constraints related to cost of Myrbetriq and Eliquis related toAtrial Fibrillation, CAD, HTN, DMII, GERD, Overactive Bladder, and hx CVA as evidenced by review or EMR and patient or pharmacist report through collaboration with RN Care manager, provider, and care team.   Interventions: 1:1 collaboration with primary care provider regarding development and update of comprehensive plan of care as evidenced by provider  attestation and co-signature Inter-disciplinary care team collaboration (see longitudinal plan of care) Evaluation of current treatment plan related to  self management and patient's adherence to plan as established by provider  Falls Interventions:  (Status:  Goal Met.) Long Term Goal Reviewed medications and discussed potential side effects of medications such as dizziness and frequent urination Advised patient of importance of notifying provider of falls Assessed for falls since last encounter Assessed patients knowledge of fall risk prevention secondary to previously provided education Reinforced to continue to do exercises PT is teaching her to get stronger and going to water therapy.  Reinforced to use cane at all times     AFIB Interventions: (Status:  Goal Met.) Long Term Goal   Counseled on increased risk of stroke due to Afib and benefits of anticoagulation for stroke prevention Reviewed importance of adherence to anticoagulant exactly as prescribed Counseled on bleeding risk associated with Eliquis and importance of self-monitoring for signs/symptoms of bleeding  Counseled on avoidance of NSAIDs due to increased bleeding risk with anticoagulants Counseled on seeking medical attention after a head injury or if there is blood in the urine/stool Afib action plan reviewed     CAD Interventions: (Status:  Goal Met.) Long Term Goal Assessed understanding of CAD diagnosis Medications reviewed including medications utilized in CAD treatment plan Provided education on importance of blood pressure control in management of CAD Provided education on Importance of limiting foods high in cholesterol Counseled on importance of regular laboratory monitoring as prescribed Reviewed Importance of taking all medications as prescribed Reviewed Importance of attending all scheduled provider appointments Advised to report any changes in symptoms or exercise tolerance Reinforced s/sx to call  provider or 911   Diabetes Interventions:  (Status:  Goal Met.) Long Term Goal Assessed patient's understanding of A1c goal: <7% Provided education to patient about basic DM disease process Reviewed medications with patient and discussed importance of medication adherence Counseled on importance of regular laboratory monitoring as prescribed Discussed plans with patient for ongoing care management follow up and provided patient with direct contact information for care management team Advised patient, providing education and rationale, to check cbg twice a day and record, calling provider for findings outside established parameters Reinforced with pt need to get new endocrinologist. Communicated with provider that pt has not been contacted about referral for new endocrinologist.  Reinforced to try to eat lunch or small snack before taking her nap to help prevent hypoglycemia Lab Results  Component Value Date   HGBA1C 7.4 (H) 09/08/2020  6.3% 08/28/21 at endocrinology  Hyperlipidemia Interventions:  (Status:  Goal Met.) Long Term Goal Medication review performed; medication list updated in electronic medical record.  Provider established cholesterol goals reviewed Counseled on importance of regular laboratory monitoring as prescribed Reviewed role and benefits of statin for ASCVD risk reduction Reviewed importance of limiting foods high in cholesterol Reviewed exercise goals and target of 150 minutes per week Reinforced to continue Repatha   Hypertension Interventions:  (Status:  Goal Met.) Long Term Goal Last practice recorded BP readings:  BP Readings from Last 3 Encounters:  11/16/21 110/80  11/07/21 126/70  10/25/21 136/62  Most recent eGFR/CrCl: No results found for: EGFR  No components found for: CRCL  Evaluation of current treatment plan related to hypertension self management and patient's adherence to plan as established by provider Provided education to patient re: stroke  prevention, s/s of heart attack and stroke Reviewed medications with patient and discussed importance of compliance Counseled on the importance of exercise goals with target of 150 minutes per week Discussed plans with patient for ongoing care management follow up and provided patient with direct contact information for care management team Advised patient, providing education and rationale, to monitor blood pressure daily and record, calling PCP for findings outside established parameters Provided education on prescribed diet low sodium low CHO heart healthy  Stroke:  (Status:Goal Met.) Long Term Goal Reviewed Importance of taking all medications as prescribed Advised to report any changes in symptoms or exercise tolerance Assessed for management of bladder and/or bowel incontinence Reinforced to keep appointment with neurology. Reinforced to continue PT to help with balance and strength    Urinary frequency Goal Met. Long Term Goal Evaluation of current treatment plan related to Overactive Bladder, Financial constraints related to cost of Myrbetric  self-management and patient's adherence to plan as established by provider. Discussed plans with patient for ongoing care management follow up and provided patient with direct contact information  for care management team Evaluation of current treatment plan related to Urinary frequency and patient's adherence to plan as established by provider Pharmacy referral for communicated to PharmD pts issues with cost of Gemtesa  Discussed plans with patient for ongoing care management follow up and provided patient with direct contact information for care management team Reinforced to limit fluids later in the evening to help decrease night time trips to the bathroom. Reinforced to keep appointments  Dr. [name] and urology. Reviewed to contact urology office to see if she can get samples of Gemtesa or change her something less expensive    Patient  Goals/Self-Care Activities: Take all medications as prescribed Attend all scheduled provider appointments Call pharmacy for medication refills 3-7 days in advance of running out of medications Call provider office for new concerns or questions  keep appointment with eye doctor check blood sugar at prescribed times: twice daily and when you have symptoms of low or high blood sugar fill half of plate with vegetables manage portion size keep feet up while sitting wash and dry feet carefully every day wear comfortable, cotton socks wear comfortable, well-fitting shoes check pulse (heart) rate once a day make a plan to eat healthy take medicine as prescribed check blood pressure daily choose a place to take my blood pressure (home, clinic or office, retail store) call doctor for signs and symptoms of high blood pressure keep all doctor appointments eat more whole grains, fruits and vegetables, lean meats and healthy fats call for medicine refill 2 or 3 days before it runs out take all medications exactly as prescribed call doctor with any symptoms you believe are related to your medicine Follow Up Plan:  The patient has been provided with contact information for the care management team and has been advised to call with any health related questions or concerns.  No further follow up required: RNCM Case closed goals met         Plan:The patient has been provided with contact information for the care management team and has been advised to call with any health related questions or concerns.  No further follow up required: RNCM Case closed goals met Peter Garter RN, [facility], CDE Care Management Coordinator [place] [phone]

## 2021-11-20 NOTE — Patient Instructions (Addendum)
Medication Instructions:  Your physician has recommended you make the following change in your medication:   1) STOP Furosemide (Lasix)  Please let us know if you have increased swelling or shortness of breath.  *If you need a refill on your cardiac medications before your next appointment, please call your pharmacy*  Lab Work: TODAY: BMET, BNP If you have labs (blood work) drawn today and your tests are completely normal, you will receive your results only by: [place] (if you have MyChart) OR A paper copy in the mail If you have any lab test that is abnormal or we need to change your treatment, we will call you to review the results.  Testing/Procedures: Your physician has requested that you have an echocardiogram. Echocardiography is a painless test that uses sound waves to create images of your heart. It provides your doctor with information about the size and shape of your heart and how well your heart's chambers and valves are working. This procedure takes approximately one hour. There are no restrictions for this procedure.  Follow-Up: At [facility], you and your health needs are our priority.  As part of our continuing mission to provide you with exceptional heart care, we have created designated Provider Care Teams.  These Care Teams include your primary Cardiologist (physician) and Advanced Practice Providers (APPs -  Physician Assistants and Nurse Practitioners) who all work together to provide you with the care you need, when you need it.  Your next appointment:   4 month(s)  The format for your next appointment:   In Person  Provider:   Sinclair Grooms, MD {   Important Information About Sugar

## 2021-11-20 NOTE — Patient Instructions (Signed)
Visit Information RNCM Case closed goals met Thank you for allowing me to share the care management and care coordination services that are available to you as part of your health plan and services through your primary care provider and medical home. Please reach out to me at [phone] if the care management/care coordination team may be of assistance to you in the future.   Zuleima Haser RN, BSN,CCM, CDE Care Management Coordinator [facility] [phone]   

## 2021-11-21 DIAGNOSIS — L821 Other seborrheic keratosis: Secondary | ICD-10-CM | POA: Diagnosis not present

## 2021-11-21 DIAGNOSIS — D225 Melanocytic nevi of trunk: Secondary | ICD-10-CM | POA: Diagnosis not present

## 2021-11-21 DIAGNOSIS — L729 Follicular cyst of the skin and subcutaneous tissue, unspecified: Secondary | ICD-10-CM | POA: Diagnosis not present

## 2021-11-21 DIAGNOSIS — L814 Other melanin hyperpigmentation: Secondary | ICD-10-CM | POA: Diagnosis not present

## 2021-11-21 DIAGNOSIS — L304 Erythema intertrigo: Secondary | ICD-10-CM | POA: Diagnosis not present

## 2021-11-21 DIAGNOSIS — D229 Melanocytic nevi, unspecified: Secondary | ICD-10-CM | POA: Diagnosis not present

## 2021-11-21 DIAGNOSIS — L578 Other skin changes due to chronic exposure to nonionizing radiation: Secondary | ICD-10-CM | POA: Diagnosis not present

## 2021-11-21 LAB — PRO B NATRIURETIC PEPTIDE: NT-Pro BNP: 310 pg/mL (ref 0–738)

## 2021-11-21 LAB — BASIC METABOLIC PANEL
BUN/Creatinine Ratio: 29 — ABNORMAL HIGH (ref 12–28)
BUN: 23 mg/dL (ref 8–27)
CO2: 24 mmol/L (ref 20–29)
Calcium: 10.8 mg/dL — ABNORMAL HIGH (ref 8.7–10.3)
Chloride: 102 mmol/L (ref 96–106)
Creatinine, Ser: 0.8 mg/dL (ref 0.57–1.00)
Glucose: 135 mg/dL — ABNORMAL HIGH (ref 70–99)
Potassium: 3.9 mmol/L (ref 3.5–5.2)
Sodium: 142 mmol/L (ref 134–144)
eGFR: 75 mL/min/{1.73_m2} (ref >59)

## 2021-11-22 ENCOUNTER — Telehealth: Payer: Self-pay

## 2021-11-22 ENCOUNTER — Other Ambulatory Visit (HOSPITAL_BASED_OUTPATIENT_CLINIC_OR_DEPARTMENT_OTHER): Payer: Self-pay

## 2021-11-22 MED ORDER — FUROSEMIDE 40 MG PO TABS
40.0000 mg | ORAL_TABLET | Freq: Every day | ORAL | 3 refills | Status: DC | PRN
  Filled 2021-11-22: qty 90, 90d supply, fill #0

## 2021-11-22 MED ORDER — POTASSIUM CHLORIDE CRYS ER 20 MEQ PO TBCR
20.0000 meq | EXTENDED_RELEASE_TABLET | Freq: Every day | ORAL | 3 refills | Status: DC | PRN
  Filled 2021-11-22: qty 90, 90d supply, fill #0
  Filled 2022-04-04: qty 90, 90d supply, fill #1

## 2021-11-22 NOTE — Telephone Encounter (Signed)
Called patient to discuss lab results and Dr. Thompson Caul recommendations.  Patient currently out to lunch and requested I call back around 4pm.

## 2021-11-22 NOTE — Telephone Encounter (Signed)
Returned call to patient and discussed lab results and recommendations per Dr. Tamala Julian.  Lasix 23m QD PRN and K-Dur 20 meq QD PRN with Lasix ordered and sent to Pharmacy of choice. Patient asked when she would take the Lasix: if swelling in feet/legs/ankles, increased SOB, weight gain of 3 lbs or more overnight or 5 lbs in 1 week.   Patient verbalized understanding and expressed appreciation for call.

## 2021-11-22 NOTE — Addendum Note (Signed)
Addended by: Molli Barrows on: 11/22/2021 04:44 PM   Modules accepted: Orders

## 2021-11-22 NOTE — Telephone Encounter (Signed)
-----   Message from [name], MD sent at 11/21/2021  3:18 PM EDT ----- Let the patient know the labs look good. No chronic therapy needed for edema. Keep legs elevated when possible. If edema becomes symptomatic, take one 40 mg furosemide as needed to control. Do not take more that 3 days in a row. Take K-Dur 20 meq with lasix doses. A copy will be sent to Stacie Glaze, DO

## 2021-11-24 ENCOUNTER — Encounter: Payer: Self-pay | Admitting: Hematology

## 2021-11-27 ENCOUNTER — Ambulatory Visit: Payer: Medicare Other | Attending: Adult Health | Admitting: Physical Therapy

## 2021-11-27 DIAGNOSIS — M6281 Muscle weakness (generalized): Secondary | ICD-10-CM | POA: Insufficient documentation

## 2021-11-27 DIAGNOSIS — R2689 Other abnormalities of gait and mobility: Secondary | ICD-10-CM

## 2021-11-27 DIAGNOSIS — G8929 Other chronic pain: Secondary | ICD-10-CM

## 2021-11-27 DIAGNOSIS — M25561 Pain in right knee: Secondary | ICD-10-CM | POA: Insufficient documentation

## 2021-11-27 DIAGNOSIS — R2681 Unsteadiness on feet: Secondary | ICD-10-CM

## 2021-11-27 DIAGNOSIS — E1149 Type 2 diabetes mellitus with other diabetic neurological complication: Secondary | ICD-10-CM | POA: Diagnosis not present

## 2021-11-28 ENCOUNTER — Encounter: Payer: Self-pay | Admitting: Physical Therapy

## 2021-11-28 NOTE — Therapy (Signed)
[facility] [address] [place], [place], [address] Phone: [phone]   Fax:  [phone]  Physical Therapy Treatment  Patient Details  Name: Breanna Webster MRN: [id number] Date of Birth: 1945/11/24 Referring Provider (PT): Dohmeier, Asencion Partridge   Encounter Date: 11/27/2021   PT End of Session - 11/28/21 0932     Visit Number 26    Number of Visits 29    Date for PT Re-Evaluation 12/04/21    Authorization Type Medicare/Generic Commercial    Authorization Time Period 08-07-21 - 09-20-21; 10-30-21 - 12-31-21    Progress Note Due on Visit 20    PT Start Time 1400    PT Stop Time 1445    PT Time Calculation (min) 45 min    Equipment Utilized During Treatment Other (comment)   bar bells, pool noodle   Activity Tolerance Patient tolerated treatment well    Behavior During Therapy [facility] for tasks assessed/performed             Past Medical History:  Diagnosis Date   Anxiety    Arthritis    "back" (04/22/2018)   Back pain    Blood transfusion without reported diagnosis    CAD (coronary artery disease)    a. 03/2018 s/p PCI/DES to the RCA (3.0x15 Onyx DES).   Carotid artery stenosis    Mild   Chest pain    Chronic lower back pain    Cirrhosis (HCC)    Colon polyps    Diverticulitis    Diverticulosis    Esophageal thickening    seen on pre TAVR CT scan, also questionable cirrhosis. MRI recommended. Will refer to GI   Fatty liver    GERD (gastroesophageal reflux disease)    Grave's disease    History of colonic polyps 05/22/2017   History of hiatal hernia    Hypertension    Hypothyroidism    IBS (irritable bowel syndrome)    Osteopenia    Pulmonary nodules    seen on pre TAVR CT. likley benign. no follow up recommended if pt low risk.   S/P TAVR (transcatheter aortic valve replacement)    Severe aortic stenosis    Shortness of breath on exertion    Stroke ([place])    Thalassemia minor    Thyroid disease     Type II diabetes mellitus ([place])     Past Surgical History:  Procedure Laterality Date   69 HOUR [place] STUDY N/A 03/03/2018   Procedure: 24 HOUR PH STUDY;  Surgeon: Mauri Pole, MD;  Location: [facility];  Service: Endoscopy;  Laterality: N/A;   AORTIC VALVE REPLACEMENT  06/2018   COLONOSCOPY     COLONOSCOPY W/ BIOPSIES AND POLYPECTOMY     CORONARY ANGIOGRAPHY Right 04/21/2018   Procedure: CORONARY ANGIOGRAPHY (CATH LAB);  Surgeon: [name], MD;  Location: [facility];  Service: Cardiovascular;  Laterality: Right;   CORONARY STENT INTERVENTION N/A 04/22/2018   Procedure: CORONARY STENT INTERVENTION;  Surgeon: [name], MD;  Location: [place] CV [facility];  Service: Cardiovascular;  Laterality: N/A;   DILATION AND CURETTAGE OF UTERUS     ESOPHAGEAL MANOMETRY N/A 03/03/2018   Procedure: ESOPHAGEAL MANOMETRY (EM);  Surgeon: Mauri Pole, MD;  Location: [facility];  Service: Endoscopy;  Laterality: N/A;   GASTRIC FUNDOPLICATION     HERNIA REPAIR     HYSTEROSCOPY     fibroids   LAPAROSCOPIC CHOLECYSTECTOMY     LAPAROSCOPY  fibroids   NISSEN FUNDOPLICATION  1610R   POLYPECTOMY     RIGHT/LEFT HEART CATH AND CORONARY ANGIOGRAPHY N/A 02/20/2018   Procedure: RIGHT/LEFT HEART CATH AND CORONARY ANGIOGRAPHY;  Surgeon: [name], MD;  Location: [facility];  Service: Cardiovascular;  Laterality: N/A;   TEE WITHOUT CARDIOVERSION N/A 07/08/2018   Procedure: TRANSESOPHAGEAL ECHOCARDIOGRAM (TEE);  Surgeon: Burnell Blanks, MD;  Location: [facility];  Service: Open Heart Surgery;  Laterality: N/A;   TEE WITHOUT CARDIOVERSION  10/07/2018   TEE WITHOUT CARDIOVERSION N/A 10/07/2018   Procedure: TRANSESOPHAGEAL ECHOCARDIOGRAM (TEE);  Surgeon: Jerline Pain, MD;  Location: [facility]COPY;  Service: Cardiovascular;  Laterality: N/A;   TONSILLECTOMY     TRANSCATHETER AORTIC VALVE REPLACEMENT, TRANSFEMORAL N/A 07/08/2018   Procedure: TRANSCATHETER AORTIC VALVE  REPLACEMENT, TRANSFEMORAL;  Surgeon: Burnell Blanks, MD;  Location: [facility];  Service: Open Heart Surgery;  Laterality: N/A;    There were no vitals filed for this visit.         Aquatic therapy at [facility] - pool temp 90 degrees  Patient seen for aquatic therapy today.  Treatment took place in water 3.5-4.5 feet deep depending upon activity.  Pt entered & exited the pool  the pool via step negotiation using step by step sequence for descension and ascension with use of hand rails with supervision; cues needed for correct sequence to minimize pain in Rt knee (descend with RLE first, ascend with LLE to minimize pain)  Pt performed water walking 18' x 4 reps forwards across pool, using pool noodle for increased floatation for unweighting RLE for reduced Rt knee pain; 18' x 4 reps with UE support on large yellow noodle;  18' x 4 reps backwards across width of pool with UE support on noodle with SBA to CGA  Pt performed standing bil. Hip exercises - flexion, abduction and extension 10 reps each leg without any increased resistance other than buoyancy of water  Pt performed marching in place 10 reps with bil. UE support on pool edge prn;  marching forwards 18' x 1 rep with use of large yellow noodle  Pt performed squats at edge of pool 10 reps x 2 sets  Pt performed water walking (forward) 18' x 4 reps with no UE support at end of session; pt performed sidestepping with squats with close SBA without UE support on noodle  Pt requires buoyancy of water for support and safety with standing balance exercises; exercises are able to be safely attempted/performed in water without or with minimal UE support that cannot be performed safely on land.  Viscosity of water is needed for resistance for strengthening.  Current of water provides perturbations for challenges with standing balance.  Buoyancy of water also provides joint offloading which increases comfort and reduces pain  in knees with weight bearing activities and exs in water, whereas, this is unable to be achieved with weight bearing exercises on land.                               PT Short Term Goals - 11/14/21 2047       PT SHORT TERM GOAL #1   Title Pt will be independent with HEP for improved strength and flexibility, balance, transfers, and gait.   TARGET 05/26/2021    Time 4    Period Weeks    Status Achieved      PT SHORT TERM GOAL #2  Title Pt will improve Berg score to at least 41/56 to decrease fall risk.    Baseline 36/56    Time 4    Period Weeks    Status Achieved      PT SHORT TERM GOAL #3   Title Pt will improve TUG score to less than or equal to 21 sec for decreased fall risk.    Baseline 26.88 sec    Time 4    Period Weeks    Status Achieved      PT SHORT TERM GOAL #4   Title Pt will verbalize understanding of fall prevention in home environment.    Time 4    Period Weeks    Status Achieved               PT Long Term Goals - 11/14/21 2047       PT LONG TERM GOAL #1   Title Pt will be independent with HEP for improved flexibility and strength, balance, transfers, and gait.  TARGET 06/23/2021    Time 5    Period Weeks    Status Achieved    Target Date 07/18/21      PT LONG TERM GOAL #2   Title Pt will improve Berg score to at least 46/56 to decrease fall risk.    Time 5    Period Weeks    Status Achieved   47/56   Target Date 07/18/21      PT LONG TERM GOAL #3   Title Pt will improve TUG score to less than or equal to 16 sec for decreased fall risk.    Baseline Not assessed - pt has been receiving aquatic therapy only for past 6 weeks    Time 5    Period Weeks    Status Deferred   18.43 sec   Target Date 07/18/21      PT LONG TERM GOAL #4   Title Pt will improve gait velocity to at least 2 ft/sec for improved gait efficiency and safety.    Baseline 1.73 ft/sec    Time 9    Period Weeks    Status Achieved   2.69 ft/s     PT  LONG TERM GOAL #5   Title Pt will verbalize plans for continued community fitness upon d/c to maximize gains made in PT.    Time 9    Period Weeks    Status Achieved   reports plans to join gym and walk at [place] #6   Title Pt will be independent in aquatic exercise program to be continued upon D/C from PT.    Baseline pt is doing land exercises only - is too fearful to do pool exercises independently and does not have access to a pool    Time 6    Period Weeks    Status On-going    Target Date 12/04/21      PT LONG TERM GOAL #7   Title Pt will report Rt knee pain intensity </= 6/10 with weight bearing.    Baseline 8/10 reported on 10-31-21    Time 6    Period Weeks    Status New    Target Date 12/04/21      PT LONG TERM GOAL #8   Title Pt will negotiate steps in pool with use of handrail using step by step sequence independently with no cues for correct sequence for Rt knee pain management (down with RLE,  up with LLE).    Baseline cues needed for correct sequence in order to minimize Rt knee pain - 10-31-21    Time 4    Period Weeks    Status New    Target Date 12/04/21                Plan - 11/28/21 0945     Clinical Impression Statement Aquatic therapy session focused on Rt knee AROM to reduce pain, bil.. LE strengthening and balance exercises.  Pt demonstrates increased confidence with mobility in the water with less reliance on noodle for UE support.  Pt reported feeling "loosened up" at end of session.  Pt presents with increased tightness on Lt side, including lateral trunk musc. compared to Rt side of body.  Cont with POC.    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[place]) 10/25/2021   Left hemiparesis ([place]) 06/21/2021   Other specified hereditary hemolytic anemias ([place]) 06/21/2021   Parkinsonism ([place]) 06/21/2021   COVID-19 02/14/2021   Postviral fatigue syndrome 02/14/2021   Difficulty with adaptive servo-ventilation (ASV) use 02/14/2021   Sepsis secondary to UTI ([place]) 09/08/2020   Elevated ALT measurement 09/08/2020   History of CVA with residual deficit 09/08/2020   Hyperbilirubinemia 09/08/2020   Fatigue associated with anemia 08/03/2020   Cerebrovascular accident (CVA) due to embolism of right posterior cerebral artery ([place]) 08/03/2020   OSA treated with BiPAP 08/03/2020   Complex sleep apnea syndrome 08/03/2020   Treatment-emergent central sleep apnea 08/03/2020   Chronic intermittent hypoxia with obstructive sleep apnea 04/21/2020   OSA (obstructive sleep apnea) 04/21/2020   History of cardioembolic stroke 74/03/8785   Gait disturbance, post-stroke 03/29/2020   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 ([place]) 03/29/2020   Anxiety    RLQ abdominal pain 10/22/2019   Paroxysmal atrial fibrillation ([place]) 05/22/2019   Iron deficiency anemia 05/07/2019   Atrial fibrillation with RVR ([place]) 10/21/2018   Cerebellar stroke, acute ([place]) 10/21/2018   Streptococcal endocarditis    Endocarditis of mitral valve 10/07/2018   Bacteremia due to Streptococcus Salivarius 10/07/2018   Sepsis ([place]) 76/72/0947   Acute metabolic encephalopathy 09/62/8366   Severe aortic stenosis 07/08/2018   S/P TAVR (transcatheter aortic valve replacement) 07/08/2018   Esophageal thickening    CAD in native artery 04/22/2018   Gastroesophageal reflux disease    Pulmonary hypertension ([place]) 02/21/2018   Essential hypertension 07/15/2017   History of colonic polyps 05/22/2017   Elevated liver function tests  12/05/2016   DOE (dyspnea on exertion) 07/19/2016   Thalassemia minor 05/29/2016   Left bundle branch block 12/06/2015   Upper airway cough syndrome 29/47/6546   Diastolic heart failure ([place]) 10/10/2015   Myalgia 02/17/2014   Carotid artery stenosis 06/05/2013   Eustachian tube dysfunction 05/07/2013   Neuropathy of leg 03/07/2012   Anemia, unspecified 01/31/2012   Hot flashes 08/24/2010   Diabetes mellitus due to underlying condition, uncontrolled 06/30/2010   Generalized abdominal pain 03/24/2010   Obesity (  BMI 30.0-34.9) 12/21/2009   IBS (irritable bowel syndrome) 04/29/2009   Vitamin D deficiency 03/10/2009   Acute bronchitis 03/07/2009   Hypothyroidism 12/13/2008   Dyslipidemia 12/13/2008   Anxiety state 12/13/2008   Other specified disorders of bladder 12/13/2008    Alda Lea, PT 11/28/2021, 9:37 AM  [facility] [address] [place], [place], [address] Phone: [phone]   Fax:  [phone]  Name: Breanna Webster MRN: [id number] Date of Birth: [birth year]

## 2021-11-29 ENCOUNTER — Encounter (INDEPENDENT_AMBULATORY_CARE_PROVIDER_SITE_OTHER): Payer: Self-pay

## 2021-12-04 ENCOUNTER — Ambulatory Visit: Payer: Medicare Other | Admitting: Physical Therapy

## 2021-12-04 ENCOUNTER — Other Ambulatory Visit (HOSPITAL_BASED_OUTPATIENT_CLINIC_OR_DEPARTMENT_OTHER): Payer: Self-pay

## 2021-12-04 ENCOUNTER — Encounter: Payer: Self-pay | Admitting: Physical Therapy

## 2021-12-04 DIAGNOSIS — R2689 Other abnormalities of gait and mobility: Secondary | ICD-10-CM

## 2021-12-04 DIAGNOSIS — I10 Essential (primary) hypertension: Secondary | ICD-10-CM | POA: Diagnosis not present

## 2021-12-04 DIAGNOSIS — G8929 Other chronic pain: Secondary | ICD-10-CM | POA: Diagnosis not present

## 2021-12-04 DIAGNOSIS — R2681 Unsteadiness on feet: Secondary | ICD-10-CM

## 2021-12-04 DIAGNOSIS — M25561 Pain in right knee: Secondary | ICD-10-CM | POA: Diagnosis not present

## 2021-12-04 DIAGNOSIS — M6281 Muscle weakness (generalized): Secondary | ICD-10-CM

## 2021-12-04 DIAGNOSIS — E782 Mixed hyperlipidemia: Secondary | ICD-10-CM | POA: Diagnosis not present

## 2021-12-04 DIAGNOSIS — E1165 Type 2 diabetes mellitus with hyperglycemia: Secondary | ICD-10-CM | POA: Diagnosis not present

## 2021-12-04 DIAGNOSIS — Z794 Long term (current) use of insulin: Secondary | ICD-10-CM | POA: Diagnosis not present

## 2021-12-04 DIAGNOSIS — E039 Hypothyroidism, unspecified: Secondary | ICD-10-CM | POA: Diagnosis not present

## 2021-12-04 NOTE — Therapy (Signed)
[facility] [address] [place], [place], [address] Phone: [phone]   Fax:  [phone]  Physical Therapy Treatment  Patient Details  Name: Breanna Webster MRN: [id number] Date of Birth: [birth year] Referring Provider (PT): Dohmeier, Asencion Partridge   Encounter Date: 12/04/2021   PT End of Session - 12/04/21 1833     Visit Number 27    Number of Visits 29    Date for PT Re-Evaluation 12/04/21    Authorization Type Medicare/Generic Commercial    Authorization Time Period 08-07-21 - 09-20-21; 10-30-21 - 12-31-21    Progress Note Due on Visit 20    PT Start Time 1400    PT Stop Time 1450    PT Time Calculation (min) 50 min    Equipment Utilized During Treatment Other (comment)   bar bells, pool noodle   Activity Tolerance Patient tolerated treatment well    Behavior During Therapy [facility] for tasks assessed/performed             Past Medical History:  Diagnosis Date   Anxiety    Arthritis    "back" (04/22/2018)   Back pain    Blood transfusion without reported diagnosis    CAD (coronary artery disease)    a. 03/2018 s/p PCI/DES to the RCA (3.0x15 Onyx DES).   Carotid artery stenosis    Mild   Chest pain    Chronic lower back pain    Cirrhosis (HCC)    Colon polyps    Diverticulitis    Diverticulosis    Esophageal thickening    seen on pre TAVR CT scan, also questionable cirrhosis. MRI recommended. Will refer to GI   Fatty liver    GERD (gastroesophageal reflux disease)    Grave's disease    History of colonic polyps 05/22/2017   History of hiatal hernia    Hypertension    Hypothyroidism    IBS (irritable bowel syndrome)    Osteopenia    Pulmonary nodules    seen on pre TAVR CT. likley benign. no follow up recommended if pt low risk.   S/P TAVR (transcatheter aortic valve replacement)    Severe aortic stenosis    Shortness of breath on exertion    Stroke ([place])    Thalassemia minor    Thyroid disease     Type II diabetes mellitus ([place])     Past Surgical History:  Procedure Laterality Date   95 HOUR [place] STUDY N/A 03/03/2018   Procedure: 24 HOUR PH STUDY;  Surgeon: Mauri Pole, MD;  Location: [facility];  Service: Endoscopy;  Laterality: N/A;   AORTIC VALVE REPLACEMENT  06/2018   COLONOSCOPY     COLONOSCOPY W/ BIOPSIES AND POLYPECTOMY     CORONARY ANGIOGRAPHY Right 04/21/2018   Procedure: CORONARY ANGIOGRAPHY (CATH LAB);  Surgeon: [name], MD;  Location: [facility];  Service: Cardiovascular;  Laterality: Right;   CORONARY STENT INTERVENTION N/A 04/22/2018   Procedure: CORONARY STENT INTERVENTION;  Surgeon: [name], MD;  Location: [facility] CV LAB;  Service: Cardiovascular;  Laterality: N/A;   DILATION AND CURETTAGE OF UTERUS     ESOPHAGEAL MANOMETRY N/A 03/03/2018   Procedure: ESOPHAGEAL MANOMETRY (EM);  Surgeon: Mauri Pole, MD;  Location: [facility];  Service: Endoscopy;  Laterality: N/A;   GASTRIC FUNDOPLICATION     HERNIA REPAIR     HYSTEROSCOPY     fibroids   LAPAROSCOPIC CHOLECYSTECTOMY     LAPAROSCOPY  fibroids   NISSEN FUNDOPLICATION  1740C   POLYPECTOMY     RIGHT/LEFT HEART CATH AND CORONARY ANGIOGRAPHY N/A 02/20/2018   Procedure: RIGHT/LEFT HEART CATH AND CORONARY ANGIOGRAPHY;  Surgeon: [name], MD;  Location: [facility];  Service: Cardiovascular;  Laterality: N/A;   TEE WITHOUT CARDIOVERSION N/A 07/08/2018   Procedure: TRANSESOPHAGEAL ECHOCARDIOGRAM (TEE);  Surgeon: Burnell Blanks, MD;  Location: [facility];  Service: Open Heart Surgery;  Laterality: N/A;   TEE WITHOUT CARDIOVERSION  10/07/2018   TEE WITHOUT CARDIOVERSION N/A 10/07/2018   Procedure: TRANSESOPHAGEAL ECHOCARDIOGRAM (TEE);  Surgeon: Jerline Pain, MD;  Location: [facility] ENDOSCOPY;  Service: Cardiovascular;  Laterality: N/A;   TONSILLECTOMY     TRANSCATHETER AORTIC VALVE REPLACEMENT, TRANSFEMORAL N/A 07/08/2018   Procedure: TRANSCATHETER AORTIC VALVE  REPLACEMENT, TRANSFEMORAL;  Surgeon: Burnell Blanks, MD;  Location: [facility];  Service: Open Heart Surgery;  Laterality: N/A;    There were no vitals filed for this visit.   Subjective Assessment - 12/04/21 1827     Subjective Pt states she continues to have Rt knee pain; states she is having some difficulty putting on the brace given to her by ortho MD; pt reports she and family are leaving Saturday to go on a cruise for a week.  Pt reports she plans to join [employer] when she gets back from her trip.    Patient is accompained by: Family member   husband, [name]   Pertinent History significant arthritis in knees    Patient Stated Goals Pt's goals for therapy are to get better with walking and with arthritis.    Currently in Pain? Yes    Pain Score 8     Pain Location Knee    Pain Orientation Right    Pain Descriptors / Indicators Aching;Grimacing;Nagging    Pain Type Chronic pain    Pain Onset More than a month ago    Pain Frequency Intermittent                  Aquatic therapy at [facility] - pool temp 90 degrees  Patient seen for aquatic therapy today.  Treatment took place in water 3.5-4.0 feet deep depending upon activity.  Pt entered & exited the pool  the pool via step negotiation using step by step sequence for descension and ascension with use of hand rails with supervision; intermittent cues needed for correct sequence to minimize pain in Rt knee (descend with RLE first, ascend with LLE to minimize pain)  Pt performed water walking 18' x 6 reps forwards across pool, using large yellow pool noodle for increased floatation for unweighting RLE for reduced Rt knee pain; 18' x 4 reps with UE support on large yellow noodle;  18' x 4 reps backwards across width of pool with UE support on noodle with SBA to CGA  Pt performed standing bil. Hip exercises - flexion, abduction and extension 10 reps each leg without any increased resistance other  than buoyancy of water  Pt performed marching in place 10 reps with bil. UE support on pool edge prn;  marching forwards 18' x 2 reps with use of large yellow noodle  Pt performed squats at edge of pool 10 reps x 2 sets  Pt performed stepping strategy - forward, back and side stepping 5 reps each LE each direction with use of yellow noodle for UE support for balance  Pt sat in whirlpool at end of session for warmth of water and  circulation of water by jets to massage Rt knee for reduced pain; pt able to maintain balance in whirlpool in today's session compared to first time in whirlpool 2 weeks ago   Pt requires buoyancy of water for support and safety with standing balance exercises; exercises are able to be safely attempted/performed in water without or with minimal UE support that cannot be performed safely on land.  Viscosity of water is needed for resistance for strengthening.  Current of water provides perturbations for challenges with standing balance.  Buoyancy of water also provides joint offloading which increases comfort and reduces pain in knees with weight bearing activities and exs in water, whereas, this is unable to be achieved with weight bearing exercises on land.                                               PT Short Term Goals - 12/04/21 1839       PT SHORT TERM GOAL #1   Title Pt will be independent with HEP for improved strength and flexibility, balance, transfers, and gait.   TARGET 05/26/2021    Time 4    Period Weeks    Status Achieved      PT SHORT TERM GOAL #2   Title Pt will improve Berg score to at least 41/56 to decrease fall risk.    Baseline 36/56    Time 4    Period Weeks    Status Achieved      PT SHORT TERM GOAL #3   Title Pt will improve TUG score to less than or equal to 21 sec for decreased fall risk.    Baseline 26.88 sec    Time 4    Period Weeks    Status Achieved      PT SHORT TERM GOAL #4   Title  Pt will verbalize understanding of fall prevention in home environment.    Time 4    Period Weeks    Status Achieved               PT Long Term Goals - 12/04/21 1840       PT LONG TERM GOAL #1   Title Pt will be independent with HEP for improved flexibility and strength, balance, transfers, and gait.  TARGET 06/23/2021    Time 5    Period Weeks    Status Achieved    Target Date 07/18/21      PT LONG TERM GOAL #2   Title Pt will improve Berg score to at least 46/56 to decrease fall risk.    Time 5    Period Weeks    Status Achieved   47/56   Target Date 07/18/21      PT LONG TERM GOAL #3   Title Pt will improve TUG score to less than or equal to 16 sec for decreased fall risk.    Baseline Not assessed - pt has been receiving aquatic therapy only for past 6 weeks    Time 5    Period Weeks    Status Deferred   18.43 sec   Target Date 07/18/21      PT LONG TERM GOAL #4   Title Pt will improve gait velocity to at least 2 ft/sec for improved gait efficiency and safety.    Baseline 1.73 ft/sec    Time 9  Period Weeks    Status Achieved   2.69 ft/s     PT LONG TERM GOAL #5   Title Pt will verbalize plans for continued community fitness upon d/c to maximize gains made in PT.    Time 9    Period Weeks    Status Achieved   reports plans to join gym and walk at [place] #6   Title Pt will be independent in aquatic exercise program to be continued upon D/C from PT.    Baseline pt is doing land exercises only - is too fearful to do pool exercises independently and does not have access to a pool    Time 6    Period Weeks    Status On-going    Target Date 12/04/21      PT LONG TERM GOAL #7   Title Pt will report Rt knee pain intensity </= 6/10 with weight bearing.    Baseline 8/10 reported on 10-31-21    Time 6    Period Weeks    Status New    Target Date 12/04/21      PT LONG TERM GOAL #8   Title Pt will negotiate steps in pool with use of  handrail using step by step sequence independently with no cues for correct sequence for Rt knee pain management (down with RLE, up with LLE).    Baseline cues needed for correct sequence in order to minimize Rt knee pain - 10-31-21    Time 4    Period Weeks    Status New    Target Date 12/04/21                   Plan - 12/04/21 1834     Clinical Impression Statement Aquatic PT session focused on balance and gait training and Rt knee AROM; pt used large yellow noodle for assist with balance and for increased buoyancy for increased joint offloading of Rt knee for reduced pain.  Pt reports severe pain in Rt knee on land with weight bearing - at intensity 8-9/10 at times, but reports pain in 4' water depth at 2-3/10 intensity.  Pt needed less verbal cues with step negotiation for reduced Rt knee pain (RLE leads with descension, LLE leads with ascension).  Pt has 1 visit remaining in [place]; plan D/C at next session.    PT Frequency 1x / week    PT Duration 6 weeks    PT Treatment/Interventions Aquatic Therapy             Patient will benefit from skilled therapeutic intervention in order to improve the following deficits and impairments:     Visit Diagnosis: Other abnormalities of gait and mobility  Chronic pain of right knee  Unsteadiness on feet  Muscle weakness (generalized)     Problem List Patient Active Problem List   Diagnosis Date Noted   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[place]) 10/25/2021   Left hemiparesis ([place]) 06/21/2021   Other specified hereditary hemolytic anemias ([place]) 06/21/2021   Parkinsonism ([place]) 06/21/2021   COVID-19 02/14/2021   Postviral fatigue syndrome 02/14/2021   Difficulty with adaptive servo-ventilation (ASV) use 02/14/2021   Sepsis secondary to UTI ([place]) 09/08/2020   Elevated ALT measurement 09/08/2020   History of CVA with residual deficit 09/08/2020   Hyperbilirubinemia 09/08/2020   Fatigue associated with anemia 08/03/2020    Cerebrovascular accident (CVA) due to embolism of right posterior cerebral artery ([place]) 08/03/2020  OSA treated with BiPAP 08/03/2020   Complex sleep apnea syndrome 08/03/2020   Treatment-emergent central sleep apnea 08/03/2020   Chronic intermittent hypoxia with obstructive sleep apnea 04/21/2020   OSA (obstructive sleep apnea) 04/21/2020   History of cardioembolic stroke 26/20/3559   Gait disturbance, post-stroke 03/29/2020   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 ([place]) 03/29/2020   Anxiety    RLQ abdominal pain 10/22/2019   Paroxysmal atrial fibrillation ([place]) 05/22/2019   Iron deficiency anemia 05/07/2019   Atrial fibrillation with RVR ([place]) 10/21/2018   Cerebellar stroke, acute ([place]) 10/21/2018   Streptococcal endocarditis    Endocarditis of mitral valve 10/07/2018   Bacteremia due to Streptococcus Salivarius 10/07/2018   Sepsis ([place]) 74/16/3845   Acute metabolic encephalopathy 36/46/8032   Severe aortic stenosis 07/08/2018   S/P TAVR (transcatheter aortic valve replacement) 07/08/2018   Esophageal thickening    CAD in native artery 04/22/2018   Gastroesophageal reflux disease    Pulmonary hypertension ([place]) 02/21/2018   Essential hypertension 07/15/2017   History of colonic polyps 05/22/2017   Elevated liver function tests 12/05/2016   DOE (dyspnea on exertion) 07/19/2016   Thalassemia minor 05/29/2016   Left bundle branch block 12/06/2015   Upper airway cough syndrome 04/15/8249   Diastolic heart failure ([place]) 10/10/2015   Myalgia 02/17/2014   Carotid artery stenosis 06/05/2013   Eustachian tube dysfunction 05/07/2013   Neuropathy of leg 03/07/2012   Anemia, unspecified 01/31/2012   Hot flashes 08/24/2010   Diabetes mellitus due to underlying condition, uncontrolled 06/30/2010   Generalized abdominal pain 03/24/2010   Obesity (BMI 30.0-34.9) 12/21/2009   IBS (irritable bowel syndrome) 04/29/2009   Vitamin D deficiency 03/10/2009   Acute bronchitis  03/07/2009   Hypothyroidism 12/13/2008   Dyslipidemia 12/13/2008   Anxiety state 12/13/2008   Other specified disorders of bladder 12/13/2008    Alda Lea, PT 12/04/2021, 6:41 PM  [facility] [address] [place], [place], [address] Phone: [phone]   Fax:  [phone]  Name: Breanna Webster MRN: [id number] Date of Birth: May 12, 1945

## 2021-12-05 ENCOUNTER — Other Ambulatory Visit (HOSPITAL_BASED_OUTPATIENT_CLINIC_OR_DEPARTMENT_OTHER): Payer: Self-pay

## 2021-12-05 ENCOUNTER — Other Ambulatory Visit: Payer: Self-pay | Admitting: Adult Health

## 2021-12-06 ENCOUNTER — Other Ambulatory Visit (HOSPITAL_BASED_OUTPATIENT_CLINIC_OR_DEPARTMENT_OTHER): Payer: Self-pay

## 2021-12-06 MED ORDER — HUMULIN N KWIKPEN 100 UNIT/ML ~~LOC~~ SUPN
28.0000 [IU] | PEN_INJECTOR | Freq: Every day | SUBCUTANEOUS | 0 refills | Status: DC
  Filled 2021-12-06: qty 15, 30d supply, fill #0

## 2021-12-06 NOTE — Telephone Encounter (Signed)
Noted  

## 2021-12-06 NOTE — Telephone Encounter (Signed)
Ok to fill. Pt was referred to endo

## 2021-12-07 ENCOUNTER — Other Ambulatory Visit (HOSPITAL_BASED_OUTPATIENT_CLINIC_OR_DEPARTMENT_OTHER): Payer: Self-pay

## 2021-12-18 ENCOUNTER — Telehealth: Payer: Self-pay | Admitting: Interventional Cardiology

## 2021-12-18 NOTE — Telephone Encounter (Signed)
Pt c/o Shortness Of Breath: STAT if SOB developed within the last 24 hours or pt is noticeably SOB on the phone  1. Are you currently SOB (can you hear that pt is SOB on the phone)? Yes I can hear the shortness f breath  2. How long have you been experiencing SOB?  3 days- it seems to be getting worse  3. Are you SOB when sitting or when up moving around? both  4. Are you currently experiencing any other symptoms? Legs are swollen really bad

## 2021-12-18 NOTE — Telephone Encounter (Signed)
Call transferred into triage.  Pt just returned from a week long cruise.  Flew back to [place] last evening.  She took the lasix w her and has been using 40 mg daily x 3 days at a time.  Then stops for a couple days per Dr. Thompson Caul direction.  She has swelling and her legs are both hurting and she is SOB although she says "I am always short of breath when I get like this" She has been wearing compression stockings.  She is elevating legs when possible.  Having good urine output on lasix.  Her weight is up 5 pounds.   I scheduled her with Dr. Radford Pax tomorrow afternoon.  She mentions that her husband has a head cold.  She does not have any symptoms but I asked her to wear a mask tomorrow to the office.  Adv to avoid sodium in her diet as much as possible, elevate legs.  Call or go to ER with worsening symptoms. Pt voices understanding.

## 2021-12-19 ENCOUNTER — Other Ambulatory Visit (HOSPITAL_BASED_OUTPATIENT_CLINIC_OR_DEPARTMENT_OTHER): Payer: Self-pay

## 2021-12-19 ENCOUNTER — Encounter: Payer: Self-pay | Admitting: Hematology

## 2021-12-19 ENCOUNTER — Encounter: Payer: Self-pay | Admitting: Cardiology

## 2021-12-19 ENCOUNTER — Ambulatory Visit: Payer: Medicare Other | Attending: Cardiology | Admitting: Cardiology

## 2021-12-19 VITALS — BP 110/68 | HR 78 | Ht 65.0 in | Wt 181.6 lb

## 2021-12-19 DIAGNOSIS — I10 Essential (primary) hypertension: Secondary | ICD-10-CM | POA: Diagnosis not present

## 2021-12-19 DIAGNOSIS — I5033 Acute on chronic diastolic (congestive) heart failure: Secondary | ICD-10-CM

## 2021-12-19 DIAGNOSIS — Z952 Presence of prosthetic heart valve: Secondary | ICD-10-CM | POA: Diagnosis not present

## 2021-12-19 DIAGNOSIS — I639 Cerebral infarction, unspecified: Secondary | ICD-10-CM | POA: Diagnosis not present

## 2021-12-19 MED ORDER — FUROSEMIDE 40 MG PO TABS
40.0000 mg | ORAL_TABLET | Freq: Every day | ORAL | 3 refills | Status: DC
  Filled 2021-12-19: qty 90, 90d supply, fill #0
  Filled 2021-12-19: qty 90, 88d supply, fill #0

## 2021-12-19 NOTE — Progress Notes (Signed)
Cardiology Office Note:    Date:  12/19/2021   ID:  MYEASHA BALLOWE, DOB May 10, 1945, MRN [id number]  PCP:  [name], NP  Cardiologist:  Sinclair Grooms, MD   Referring MD: [name], NP   Chief Complaint  Patient presents with   Shortness of Breath    History of Present Illness:    Breanna Webster is a 76 y.o. 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   She has a history of recent LE swelling and SOB and was seen by Dr. Tamala Julian last month.  At that time she said the lower extremity swelling occurs intermittently over several months.  She has a chronic intermittent chest tightness but has had this off and on for years.  She does have a history of CAD and had a right coronary stent implantation in 2019 prior to TAVR.  The RCA lesion was found coincidentally.  She had her see primary care, Adriana Mccallum, NP.  Lasix was started for 3 days.  Edema got better.   Now takes Lasix 40 mg daily as needed for weight gain.  She called in yesterday after returning from weeklong cruise and flew back to [place].  She took her Lasix with her on her trip and has been using 40 mg daily for 3 days.  She stopped for a couple days and then had swelling in her legs which started hurting and she was short of breath.  When she got home yesterday she has severe LE edema and SOB and called in and was started Lasix 59m yesterday.  She had been wearing compression hose and elevating her legs when possible.  She says that her weight was 5 pounds up from before going on the cruise but was similar to what she was when she saw Dr. STamala Julian  She is now here for evaluation.  She denies any chest  pain or pressure, palpitations, or syncope.    Past Medical History:  Diagnosis Date   Anxiety    Arthritis    "back" (04/22/2018)   Back pain    Blood transfusion without reported diagnosis    CAD (coronary artery disease)    a. 03/2018 s/p PCI/DES to the RCA (3.0x15 Onyx DES).   Carotid artery stenosis    Mild   Chest pain    Chronic lower back pain    Cirrhosis (HCC)    Colon polyps    Diverticulitis    Diverticulosis    Esophageal thickening    seen on pre TAVR CT scan, also questionable cirrhosis. MRI recommended. Will refer to GI   Fatty liver    GERD (gastroesophageal reflux disease)    Grave's disease    History of colonic polyps 05/22/2017   History of hiatal hernia    Hypertension    Hypothyroidism    IBS (irritable bowel syndrome)    Osteopenia    Pulmonary nodules    seen on pre TAVR CT. likley benign. no follow up recommended if pt low risk.   S/P TAVR (transcatheter aortic valve replacement)    Severe aortic stenosis    Shortness of breath on exertion    Stroke (HCC)    Thalassemia minor    Thyroid disease  Type II diabetes mellitus ([place])     Past Surgical History:  Procedure Laterality Date   33 HOUR [place] STUDY N/A 03/03/2018   Procedure: 24 HOUR PH STUDY;  Surgeon: Mauri Pole, MD;  Location: [facility];  Service: Endoscopy;  Laterality: N/A;   AORTIC VALVE REPLACEMENT  06/2018   COLONOSCOPY     COLONOSCOPY W/ BIOPSIES AND POLYPECTOMY     CORONARY ANGIOGRAPHY Right 04/21/2018   Procedure: CORONARY ANGIOGRAPHY (CATH LAB);  Surgeon: [name], MD;  Location: [place] [facility];  Service: Cardiovascular;  Laterality: Right;   CORONARY STENT INTERVENTION N/A 04/22/2018   Procedure: CORONARY STENT INTERVENTION;  Surgeon: [name], MD;  Location: [place] [facility];  Service: Cardiovascular;  Laterality: N/A;   DILATION AND CURETTAGE OF UTERUS     ESOPHAGEAL MANOMETRY N/A 03/03/2018   Procedure: ESOPHAGEAL MANOMETRY (EM);  Surgeon:  Mauri Pole, MD;  Location: [facility];  Service: Endoscopy;  Laterality: N/A;   GASTRIC FUNDOPLICATION     HERNIA REPAIR     HYSTEROSCOPY     fibroids   LAPAROSCOPIC CHOLECYSTECTOMY     LAPAROSCOPY     fibroids   NISSEN FUNDOPLICATION  4656C   POLYPECTOMY     RIGHT/LEFT HEART CATH AND CORONARY ANGIOGRAPHY N/A 02/20/2018   Procedure: RIGHT/LEFT HEART CATH AND CORONARY ANGIOGRAPHY;  Surgeon: [name], MD;  Location: [place] [facility];  Service: Cardiovascular;  Laterality: N/A;   TEE WITHOUT CARDIOVERSION N/A 07/08/2018   Procedure: TRANSESOPHAGEAL ECHOCARDIOGRAM (TEE);  Surgeon: Burnell Blanks, MD;  Location: [place];  Service: Open Heart Surgery;  Laterality: N/A;   TEE WITHOUT CARDIOVERSION  10/07/2018   TEE WITHOUT CARDIOVERSION N/A 10/07/2018   Procedure: TRANSESOPHAGEAL ECHOCARDIOGRAM (TEE);  Surgeon: Jerline Pain, MD;  Location: [facility];  Service: Cardiovascular;  Laterality: N/A;   TONSILLECTOMY     TRANSCATHETER AORTIC VALVE REPLACEMENT, TRANSFEMORAL N/A 07/08/2018   Procedure: TRANSCATHETER AORTIC VALVE REPLACEMENT, TRANSFEMORAL;  Surgeon: Burnell Blanks, MD;  Location: [place];  Service: Open Heart Surgery;  Laterality: N/A;    Current Medications: Current Meds  Medication Sig   apixaban (ELIQUIS) 5 MG TABS tablet Take 1 tablet (5 mg total) by mouth 2 (two) times daily.   b complex vitamins capsule Take 1 capsule by mouth daily.   BD PEN NEEDLE NANO 2ND GEN 32G X 4 MM MISC    cholecalciferol (VITAMIN D) 25 MCG (1000 UNIT) tablet Take 1,000 Units by mouth daily.   diltiazem (CARDIZEM CD) 120 MG 24 hr capsule Take 1 capsule (120 mg total) by mouth daily.   escitalopram (LEXAPRO) 20 MG tablet TAKE ONE TABLET BY MOUTH DAILY   Evolocumab (REPATHA SURECLICK) 127 MG/ML SOAJ Inject 1 dose into the skin every 14 (fourteen) days.   furosemide (LASIX) 40 MG tablet Take 1 tablet (40 mg total) by mouth daily as needed (for swelling/weight gain of 3  lbs or more overnight or 5 lbs in a week). Take a potassium chloride tablet when you take Lasix.   glucose blood (ONETOUCH VERIO) test strip USE TO CHECK BLOOD SUGAR THREE TIMES DAILY   Insulin NPH, Human,, Isophane, (HUMULIN N KWIKPEN) 100 UNIT/ML Kiwkpen Inject 28 Units into the skin daily.   Insulin Pen Needle (UNIFINE PENTIPS) 32G X 4 MM MISC Inject 4 times subcutaneously   insulin regular (HUMULIN R) 100 units/mL injection Inject 0.1 mLs (10 Units total) into the skin 3 (three) times daily before meals.   Insulin  Syringe-Needle U-100 (ULTICARE INSULIN SYRINGE) 31G X 5/16" 1 ML MISC Inject 4 times daily subcutaneously   Lancets (ONETOUCH DELICA PLUS SRPRXY58P) MISC Use twice daily   levothyroxine (SYNTHROID) 137 MCG tablet Take 137 mcg by mouth daily before breakfast.   losartan (COZAAR) 50 MG tablet TAKE ONE TABLET BY MOUTH DAILY   metFORMIN (GLUCOPHAGE-XR) 500 MG 24 hr tablet Take 1 tablet by mouth 2 times daily   Multiple Vitamin (MULITIVITAMIN WITH MINERALS) TABS Take 1 tablet by mouth daily.   nitroGLYCERIN (NITROSTAT) 0.4 MG SL tablet Place 1 tablet (0.4 mg total) under the tongue every 5 (five) minutes as needed for chest pain.   NOVOLIN N 100 UNIT/ML injection Inject 28 Units into the skin daily before breakfast.   NOVOLIN R 100 UNIT/ML injection 8 Units 2 (two) times daily before a meal. 10 UNITS THIRTY MINUTES BEFORE MEALS.  5 ADDITIONAL UNITS WITH CARBS OR SNACKS. (04/26/2021:  Pt reports she only takes the 8 units 2x/daily before meals, not the 10 units)   omeprazole (PRILOSEC) 20 MG capsule Take 1 capsule (20 mg total) by mouth 2 (two) times daily before a meal.   potassium chloride SA (KLOR-CON M) 20 MEQ tablet Take 1 tablet (20 mEq total) by mouth daily as needed. Take with Furosemide (Lasix).   [DISCONTINUED] levothyroxine (SYNTHROID) 125 MCG tablet Take 1 tablet by mouth in the morning on an empty stomach once a day. 90 days     Allergies:   Statins   Social History    Socioeconomic History   Marital status: Married    Spouse name: Not on file   Number of children: 2   Years of education: Not on file   Highest education level: Not on file  Occupational History   Occupation: Tree surgeon of the [employer]  Tobacco Use   Smoking status: Never   Smokeless tobacco: Never  Vaping Use   Vaping Use: Never used  Substance and Sexual Activity   Alcohol use: No    Alcohol/week: 0.0 standard drinks of alcohol   Drug use: Never   Sexual activity: Not Currently  Other Topics Concern   Not on file  Social History Narrative   Lives with husband in a one story home.     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   Education: college.   Social Determinants of Health   Financial Resource Strain: Medium Risk (11/13/2021)   Overall Financial Resource Strain (CARDIA)    Difficulty of Paying Living Expenses: Somewhat hard  Food Insecurity: No Food Insecurity (04/03/2021)   Hunger Vital Sign    Worried About Running Out of Food in the Last Year: Never true    Ran Out of Food in the Last Year: Never true  Transportation Needs: No Transportation Needs (04/03/2021)   PRAPARE - Hydrologist (Medical): No    Lack of Transportation (Non-Medical): No  Physical Activity: Inactive (04/03/2021)   Exercise Vital Sign    Days of Exercise per Week: 0 days    Minutes of Exercise per Session: 0 min  Stress: No Stress Concern Present (04/03/2021)   [place]    Feeling of Stress : Not at all  Social Connections: [place] (04/03/2021)   Social Connection and Isolation Panel [NHANES]    Frequency of Communication with Friends and Family: More than three times a week  Frequency of Social Gatherings with Friends and Family: More than three times a week    Attends Religious Services:  More than 4 times per year    Active Member of [employer] or Organizations: Yes    Attends Music therapist: More than 4 times per year    Marital Status: Married     Family History: The patient's family history includes Headache in an other family member; Healthy in her son; Heart failure in her mother. There is no history of Colon cancer, Pancreatic cancer, Rectal cancer, or Stomach cancer. She was adopted.  ROS:   Please see the history of present illness.    No new complaints other than the chest discomfort as noted above.  All other systems reviewed and are negative.  EKGs/Labs/Other Studies Reviewed:    The following studies were reviewed today: No recent imaging.  Last echocardiogram March 21, 2021: IMPRESSIONS     1. Left ventricular ejection fraction, by estimation, is 60 to 65%. The  left ventricle has normal function. The left ventricle has no regional  wall motion abnormalities. There is mild left ventricular hypertrophy.  Left ventricular diastolic parameters  are consistent with Grade I diastolic dysfunction (impaired relaxation).  The average left ventricular global longitudinal strain is -19.9 %. The  global longitudinal strain is normal.   2. Right ventricular systolic function is normal. The right ventricular  size is normal.   3. Left atrial size was moderately dilated.   4. The mitral valve is normal in structure. No evidence of mitral valve  regurgitation. Mild mitral stenosis. The mean mitral valve gradient is 4.5  mmHg. Severe mitral annular calcification.   5. The aortic valve has been repaired/replaced. Aortic valve  regurgitation is not visualized. No aortic stenosis is present. There is a  23 mm Edwards Sapien prosthetic (TAVR) valve present in the aortic  position. Echo findings are consistent with normal   structure and function of the aortic valve prosthesis. Aortic valve area,  by VTI measures 1.99 cm. Aortic valve mean gradient  measures 15.0 mmHg.  Aortic valve Vmax measures 2.43 m/s.   6. The inferior vena cava is normal in size with greater than 50%  respiratory variability, suggesting right atrial pressure of 3 mmHg.   Comparison(s): No significant change from prior study. Prior images  reviewed side by side.  EKG:  EKG is not done today  Recent Labs: 09/22/2021: ALT 14; Hemoglobin 10.9; Platelets 173.0 11/20/2021: BUN 23; Creatinine, Ser 0.80; NT-Pro BNP 310; Potassium 3.9; Sodium 142  Recent Lipid Panel    Component Value Date/Time   CHOL 106 09/06/2021 1018   CHOL 156 11/21/2016 1113   TRIG 101 09/06/2021 1018   HDL 50 09/06/2021 1018   HDL 44 11/21/2016 1113   CHOLHDL 2.1 09/06/2021 1018   VLDL 20 09/06/2021 1018   LDLCALC 36 09/06/2021 1018   LDLCALC 91 11/21/2016 1113   LDLDIRECT 98.0 05/24/2014 1343    Physical Exam:    VS:  BP 110/68   Pulse 78   Ht 5' 5"  (1.651 m)   Wt 181 lb 9.6 oz (82.4 kg)   SpO2 97%   BMI 30.22 kg/m     Wt Readings from Last 3 Encounters:  12/19/21 181 lb 9.6 oz (82.4 kg)  11/20/21 181 lb 3.2 oz (82.2 kg)  11/16/21 179 lb (81.2 kg)    GEN: Well nourished, well developed in no acute distress HEENT: Normal NECK: No JVD; No carotid bruits LYMPHATICS: No  lymphadenopathy CARDIAC:RRR, no rubs, gallops 2/6 SM at RUSB RESPIRATORY:  Clear to auscultation without rales, wheezing or rhonchi  ABDOMEN: abdominal firmness MUSCULOSKELETAL:  1+ edema; No deformity  SKIN: Warm and dry NEUROLOGIC:  Alert and oriented x 3 PSYCHIATRIC:  Normal affect  ASSESSMENT:    1. Acute on chronic diastolic CHF (congestive heart failure) ([place])   2. S/P TAVR (transcatheter aortic valve replacement)   3. Essential hypertension     PLAN:    In order of problems listed above:  Acute on chronic diastolic CHF -Likely exacerbated by recent cruise with dietary indiscretion with sodium -She only takes her Lasix as needed but did take it 3 days in a row several times while away on her  trip -Her weight is actually stable from when she saw Dr. Tamala Julian back in July -she has mild LE edema on exam as well as increased fluid in her stomach -check BNP and BMET today -I have asked her increase Lasix to 74m BID x 2 days and then 467mdaily -check BMET in 1 week -Take her Kdru 2041mdaily -followup with Dr. SmiTamala Julian 1-2 weeks  2.  Severe AS -Status post TAVR which was stable on echo 02/2021  3.  Hypertension -BP controlled on exam -Continue Cardizem CD 120 mg daily, losartan 50 mg daily    Medication Adjustments/Labs and Tests Ordered: Current medicines are reviewed at length with the patient today.  Concerns regarding medicines are outlined above.  No orders of the defined types were placed in this encounter.  No orders of the defined types were placed in this encounter.   There are no Patient Instructions on file for this visit.   Signed, TraFransico HimD  12/19/2021 2:53 PM    [place]

## 2021-12-19 NOTE — Patient Instructions (Signed)
Medication Instructions:  Your physician has recommended you make the following change in your medication:  1) INCREASE Lasix (furosemide) 40 mg twice a day for two days, then once daily.   *If you need a refill on your cardiac medications before your next appointment, please call your pharmacy*   Lab Work: TODAY: BMET and BNP IN ONE WEEK: BMET  If you have labs (blood work) drawn today and your tests are completely normal, you will receive your results only by: [place] (if you have MyChart) OR A paper copy in the mail If you have any lab test that is abnormal or we need to change your treatment, we will call you to review the results.  Follow-Up: At [facility], you and your health needs are our priority.  As part of our continuing mission to provide you with exceptional heart care, we have created designated Provider Care Teams.  These Care Teams include your primary Cardiologist (physician) and Advanced Practice Providers (APPs -  Physician Assistants and Nurse Practitioners) who all work together to provide you with the care you need, when you need it.  Your next appointment:   2-4 week(s)  The format for your next appointment:   In Person  Provider:   Sinclair Grooms, MD     Important Information About Sugar     \

## 2021-12-19 NOTE — Addendum Note (Signed)
Addended by: Antonieta Iba on: 12/19/2021 03:07 PM   Modules accepted: Orders

## 2021-12-19 NOTE — Addendum Note (Signed)
Addended by: Antonieta Iba on: 12/19/2021 03:14 PM   Modules accepted: Orders

## 2021-12-20 ENCOUNTER — Other Ambulatory Visit (HOSPITAL_BASED_OUTPATIENT_CLINIC_OR_DEPARTMENT_OTHER): Payer: Self-pay

## 2021-12-20 ENCOUNTER — Encounter: Payer: Self-pay | Admitting: Adult Health

## 2021-12-20 ENCOUNTER — Ambulatory Visit: Payer: Medicare Other | Admitting: Nurse Practitioner

## 2021-12-20 ENCOUNTER — Telehealth: Payer: Medicare Other | Admitting: Adult Health

## 2021-12-20 ENCOUNTER — Telehealth (INDEPENDENT_AMBULATORY_CARE_PROVIDER_SITE_OTHER): Payer: Medicare Other | Admitting: Adult Health

## 2021-12-20 VITALS — Ht 65.0 in | Wt 181.0 lb

## 2021-12-20 DIAGNOSIS — I639 Cerebral infarction, unspecified: Secondary | ICD-10-CM

## 2021-12-20 DIAGNOSIS — R0981 Nasal congestion: Secondary | ICD-10-CM | POA: Diagnosis not present

## 2021-12-20 DIAGNOSIS — U071 COVID-19: Secondary | ICD-10-CM

## 2021-12-20 LAB — BASIC METABOLIC PANEL
BUN/Creatinine Ratio: 19 (ref 12–28)
BUN: 15 mg/dL (ref 8–27)
CO2: 25 mmol/L (ref 20–29)
Calcium: 10.8 mg/dL — ABNORMAL HIGH (ref 8.7–10.3)
Chloride: 99 mmol/L (ref 96–106)
Creatinine, Ser: 0.79 mg/dL (ref 0.57–1.00)
Glucose: 170 mg/dL — ABNORMAL HIGH (ref 70–99)
Potassium: 4 mmol/L (ref 3.5–5.2)
Sodium: 138 mmol/L (ref 134–144)
eGFR: 77 mL/min/{1.73_m2} (ref >59)

## 2021-12-20 LAB — PRO B NATRIURETIC PEPTIDE: NT-Pro BNP: 612 pg/mL (ref 0–738)

## 2021-12-20 MED ORDER — MOLNUPIRAVIR EUA 200MG CAPSULE
4.0000 | ORAL_CAPSULE | Freq: Two times a day (BID) | ORAL | 0 refills | Status: AC
  Filled 2021-12-20: qty 40, 5d supply, fill #0

## 2021-12-20 NOTE — Progress Notes (Signed)
Virtual Visit via Video Note  I connected with Breanna Webster on 12/20/21 at  4:15 PM EDT by a video enabled telemedicine application and verified that I am speaking with the correct person using two identifiers.  Location patient: home Location provider:work or home office Persons participating in the virtual visit: patient, provider  I discussed the limitations of evaluation and management by telemedicine and the availability of in person appointments. The patient expressed understanding and agreed to proceed.   HPI: 76 year old female who  has a past medical history of Anxiety, Arthritis, Back pain, Blood transfusion without reported diagnosis, CAD (coronary artery disease), Carotid artery stenosis, Chest pain, Chronic lower back pain, Cirrhosis ([place]), Colon polyps, Diverticulitis, Diverticulosis, Esophageal thickening, Fatty liver, GERD (gastroesophageal reflux disease), Grave's disease, History of colonic polyps (05/22/2017), History of hiatal hernia, Hypertension, Hypothyroidism, IBS (irritable bowel syndrome), Osteopenia, Pulmonary nodules, S/P TAVR (transcatheter aortic valve replacement), Severe aortic stenosis, Shortness of breath on exertion, Stroke ([place]), Thalassemia minor, Thyroid disease, and Type II diabetes mellitus ([place]).  She is being evaluated today for concern of covid 19 infection. Her husband was diagnosed today with Covid 19, they were both on a cruise and developed the same symptoms. Her symptoms include fatigue, nasal congestion, rhinorrhea and productive cough. She has not had any fevers or chills.   She is fully vaccinated.   ROS: See pertinent positives and negatives per HPI.  Past Medical History:  Diagnosis Date   Anxiety    Arthritis    "back" (04/22/2018)   Back pain    Blood transfusion without reported diagnosis    CAD (coronary artery disease)    a. 03/2018 s/p PCI/DES to the RCA (3.0x15 Onyx DES).   Carotid artery stenosis    Mild   Chest pain     Chronic lower back pain    Cirrhosis ([facility])    Colon polyps    Diverticulitis    Diverticulosis    Esophageal thickening    seen on pre TAVR CT scan, also questionable cirrhosis. MRI recommended. Will refer to GI   Fatty liver    GERD (gastroesophageal reflux disease)    Grave's disease    History of colonic polyps 05/22/2017   History of hiatal hernia    Hypertension    Hypothyroidism    IBS (irritable bowel syndrome)    Osteopenia    Pulmonary nodules    seen on pre TAVR CT. likley benign. no follow up recommended if pt low risk.   S/P TAVR (transcatheter aortic valve replacement)    Severe aortic stenosis    Shortness of breath on exertion    Stroke ([place])    Thalassemia minor    Thyroid disease    Type II diabetes mellitus ([place])     Past Surgical History:  Procedure Laterality Date   25 HOUR [place] STUDY N/A 03/03/2018   Procedure: 24 HOUR PH STUDY;  Surgeon: Mauri Pole, MD;  Location: [facility];  Service: Endoscopy;  Laterality: N/A;   AORTIC VALVE REPLACEMENT  06/2018   COLONOSCOPY     COLONOSCOPY W/ BIOPSIES AND POLYPECTOMY     CORONARY ANGIOGRAPHY Right 04/21/2018   Procedure: CORONARY ANGIOGRAPHY (CATH LAB);  Surgeon: [name], MD;  Location: [facility];  Service: Cardiovascular;  Laterality: Right;   CORONARY STENT INTERVENTION N/A 04/22/2018   Procedure: CORONARY STENT INTERVENTION;  Surgeon: [name], MD;  Location: [facility];  Service: Cardiovascular;  Laterality: N/A;   DILATION  AND CURETTAGE OF UTERUS     ESOPHAGEAL MANOMETRY N/A 03/03/2018   Procedure: ESOPHAGEAL MANOMETRY (EM);  Surgeon: Mauri Pole, MD;  Location: [facility];  Service: Endoscopy;  Laterality: N/A;   GASTRIC FUNDOPLICATION     HERNIA REPAIR     HYSTEROSCOPY     fibroids   LAPAROSCOPIC CHOLECYSTECTOMY     LAPAROSCOPY     fibroids   NISSEN FUNDOPLICATION  [id number]   POLYPECTOMY     RIGHT/LEFT HEART CATH AND CORONARY ANGIOGRAPHY N/A 02/20/2018    Procedure: RIGHT/LEFT HEART CATH AND CORONARY ANGIOGRAPHY;  Surgeon: [name], MD;  Location: [facility];  Service: Cardiovascular;  Laterality: N/A;   TEE WITHOUT CARDIOVERSION N/A 07/08/2018   Procedure: TRANSESOPHAGEAL ECHOCARDIOGRAM (TEE);  Surgeon: Burnell Blanks, MD;  Location: [facility];  Service: Open Heart Surgery;  Laterality: N/A;   TEE WITHOUT CARDIOVERSION  10/07/2018   TEE WITHOUT CARDIOVERSION N/A 10/07/2018   Procedure: TRANSESOPHAGEAL ECHOCARDIOGRAM (TEE);  Surgeon: Jerline Pain, MD;  Location: [facility] END[facility]COPY;  Service: Cardiovascular;  Laterality: N/A;   TONSILLECTOMY     TRANSCATHETER AORTIC VALVE REPLACEMENT, TRANSFEMORAL N/A 07/08/2018   Procedure: TRANSCATHETER AORTIC VALVE REPLACEMENT, TRANSFEMORAL;  Surgeon: Burnell Blanks, MD;  Location: [facility];  Service: Open Heart Surgery;  Laterality: N/A;    Family History  Adopted: Yes  Problem Relation Age of Onset   Healthy Son        x 2   Headache Other        Cluster headaches   Heart failure Mother    Colon cancer Neg Hx    Pancreatic cancer Neg Hx    Rectal cancer Neg Hx    Stomach cancer Neg Hx        Current Outpatient Medications:    apixaban (ELIQUIS) 5 MG TABS tablet, Take 1 tablet (5 mg total) by mouth 2 (two) times daily., Disp: 180 tablet, Rfl: 3   b complex vitamins capsule, Take 1 capsule by mouth daily., Disp: , Rfl:    BD PEN NEEDLE NANO 2ND GEN 32G X 4 MM MISC, , Disp: , Rfl:    cholecalciferol (VITAMIN D) 25 MCG (1000 UNIT) tablet, Take 1,000 Units by mouth daily., Disp: , Rfl:    diltiazem (CARDIZEM CD) 120 MG 24 hr capsule, Take 1 capsule (120 mg total) by mouth daily., Disp: 90 capsule, Rfl: 1   escitalopram (LEXAPRO) 20 MG tablet, TAKE ONE TABLET BY MOUTH DAILY, Disp: 90 tablet, Rfl: 1   Evolocumab (REPATHA SURECLICK) 528 MG/ML SOAJ, Inject 1 dose into the skin every 14 (fourteen) days., Disp: 2 mL, Rfl: 11   furosemide (LASIX) 40 MG tablet, Take 1 tablet (40 mg  total) by mouth daily. Take 1 tablet twice daily for two day, then 1 tablet once daily, Disp: 90 tablet, Rfl: 3   glucose blood (ONETOUCH VERIO) test strip, USE TO CHECK BLOOD SUGAR THREE TIMES DAILY, Disp: 300 strip, Rfl: 4   Insulin Pen Needle (UNIFINE PENTIPS) 32G X 4 MM MISC, Inject 4 times subcutaneously, Disp: 400 each, Rfl: 2   insulin regular (HUMULIN R) 100 units/mL injection, Inject 0.1 mLs (10 Units total) into the skin 3 (three) times daily before meals. (Patient taking differently: Inject 10 Units into the skin 2 (two) times daily before a meal.), Disp: 10 mL, Rfl: 0   Insulin Syringe-Needle U-100 (ULTICARE INSULIN SYRINGE) 31G X 5/16" 1 ML MISC, Inject 4 times daily subcutaneously, Disp: 100 each, Rfl: 1  Lancets (ONETOUCH DELICA PLUS [id number]) MISC, Use twice daily, Disp: 200 each, Rfl: 1   levothyroxine (SYNTHROID) 137 MCG tablet, Take 137 mcg by mouth daily before breakfast., Disp: , Rfl:    losartan (COZAAR) 50 MG tablet, TAKE ONE TABLET BY MOUTH DAILY, Disp: 90 tablet, Rfl: 1   metFORMIN (GLUCOPHAGE-XR) 500 MG 24 hr tablet, Take 1 tablet by mouth 2 times daily, Disp: 180 tablet, Rfl: 2   molnupiravir EUA (LAGEVRIO) 200 mg CAPS capsule, Take 4 capsules (800 mg total) by mouth 2 (two) times daily for 5 days., Disp: 40 capsule, Rfl: 0   Multiple Vitamin (MULITIVITAMIN WITH MINERALS) TABS, Take 1 tablet by mouth daily., Disp: , Rfl:    nitroGLYCERIN (NITROSTAT) 0.4 MG SL tablet, Place 1 tablet (0.4 mg total) under the tongue every 5 (five) minutes as needed for chest pain., Disp: 25 tablet, Rfl: 4   NOVOLIN N 100 UNIT/ML injection, Inject 28 Units into the skin daily before breakfast. Taking 20 units every morning and 8 units every evening, Disp: , Rfl:    omeprazole (PRILOSEC) 20 MG capsule, Take 1 capsule (20 mg total) by mouth 2 (two) times daily before a meal., Disp: 60 capsule, Rfl: 3   potassium chloride SA (KLOR-CON M) 20 MEQ tablet, Take 1 tablet (20 mEq total) by mouth daily  as needed. Take with Furosemide (Lasix)., Disp: 90 tablet, Rfl: 3   rOPINIRole (REQUIP) 0.25 MG tablet, Take 1 tablet (0.25 mg total) by mouth 3 (three) times daily., Disp: 90 tablet, Rfl: 5   Vibegron (GEMTESA) 75 MG TABS, 1 tablet PO QD, Disp: 30 tablet, Rfl: 0  EXAM:  VITALS per patient if applicable:  GENERAL: alert, oriented, appears well and in no acute distress  HEENT: atraumatic, conjunttiva clear, no obvious abnormalities on inspection of external nose and ears  NECK: normal movements of the head and neck  LUNGS: on inspection no signs of respiratory distress, breathing rate appears normal, no obvious gross SOB, gasping or wheezing  CV: no obvious cyanosis  MS: moves all visible extremities without noticeable abnormality  PSYCH/NEURO: pleasant and cooperative, no obvious depression or anxiety, speech and thought processing grossly intact  ASSESSMENT AND PLAN:  Discussed the following assessment and plan:  1. COVID-19 virus infection  - POC COVID-19- positive - Although mild symptoms will treat due to comorbid conditions.  - Side effects reviewed with the patient.  - Advised follow up if symptoms return  - molnupiravir EUA (LAGEVRIO) 200 mg CAPS capsule; Take 4 capsules (800 mg total) by mouth 2 (two) times daily for 5 days.  Dispense: 40 capsule; Refill: 0  2. Nasal congestion  - POC COVID-19     I discussed the assessment and treatment plan with the patient. The patient was provided an opportunity to ask questions and all were answered. The patient agreed with the plan and demonstrated an understanding of the instructions.   The patient was advised to call back or seek an in-person evaluation if the symptoms worsen or if the condition fails to improve as anticipated.   [name], NP

## 2021-12-22 ENCOUNTER — Other Ambulatory Visit (HOSPITAL_BASED_OUTPATIENT_CLINIC_OR_DEPARTMENT_OTHER): Payer: Self-pay

## 2021-12-28 ENCOUNTER — Other Ambulatory Visit: Payer: Medicare Other

## 2021-12-28 ENCOUNTER — Telehealth: Payer: Self-pay | Admitting: Pharmacist

## 2021-12-28 NOTE — Chronic Care Management (AMB) (Signed)
Chronic Care Management Pharmacy Assistant   Name: Breanna Webster  MRN: [id number] DOB: September 23, 1945  Reason for Encounter: Disease State   Conditions to be addressed/monitored: HTN and DMII  Recent office visits:  12/20/21 Breanna Peng, NP - Patient presented via video for Nasal congestion and other concerns. Prescribed Molnupiravir.   11/20/21 Breanna Ped, RN - Patient presented for Nurse CCM Visit. No medication changes.  11/16/21 Breanna Peng, NP - Patient presented for Frequent urination and other concerns. No medication changes.  11/07/21 Breanna, [name], MD - Patient presented for Dysuria and other concerns. Prescribed Macrobid.  Recent consult visits:  01/02/22 Breanna Crome, MD(Cardiology) - Patient presented for Acute on CHF and other concerns. Changed Furosemide.  01/11/22 Breanna, [name], MD Breanna Webster) - Patient presented for Altered bowel habits and other concerns. Prescribed Dicyclomine HCL. Prescribed Loperamide HCL. Stopped Vibegron 75 mg.  12/19/21 Breanna Margarita, MD (Cardiology) - Patient presented for Acute on chronic diastolic CHF and other concerns. Changed Furosemide 40 mg. Changed Isophane. Increased Insulin Regular Human. Increased Levothyroxine.   12/04/21 Breanna Webster, PT - Patient presented for Other abnormalities of gait and mobility and other concerns. No medication changes.  11/27/21 Breanna Webster, PT - Patient presented for Other abnormalities of gait and mobility and other concerns. No medication changes.  11/20/21 Breanna Crome, MD (Cardiology) - Patient presented for Chronic diastolic heart failure and other concerns. Stopped Estradiol. Stopped Furosemide.  11/15/21 Breanna, [name], MD - Patient presented  to [facility] for Osteoarthritis of right knee joint. No medication changes.  Hospital visits:  None in previous 6 months  Medications: Outpatient Encounter Medications as of 12/28/2021  Medication Sig   apixaban  (ELIQUIS) 5 MG TABS tablet Take 1 tablet (5 mg total) by mouth 2 (two) times daily.   b complex vitamins capsule Take 1 capsule by mouth daily.   BD PEN NEEDLE NANO 2ND GEN 32G X 4 MM MISC    cholecalciferol (VITAMIN D) 25 MCG (1000 UNIT) tablet Take 1,000 Units by mouth daily.   diltiazem (CARDIZEM CD) 120 MG 24 hr capsule Take 1 capsule (120 mg total) by mouth daily.   escitalopram (LEXAPRO) 20 MG tablet TAKE ONE TABLET BY MOUTH DAILY   Evolocumab (REPATHA SURECLICK) 993 MG/ML SOAJ Inject 1 dose into the skin every 14 (fourteen) days.   furosemide (LASIX) 40 MG tablet Take 1 tablet (40 mg total) by mouth daily. Take 1 tablet twice daily for two day, then 1 tablet once daily   glucose blood (ONETOUCH VERIO) test strip USE TO CHECK BLOOD SUGAR THREE TIMES DAILY   Insulin Pen Needle (UNIFINE PENTIPS) 32G X 4 MM MISC Inject 4 times subcutaneously   insulin regular (HUMULIN R) 100 units/mL injection Inject 0.1 mLs (10 Units total) into the skin 3 (three) times daily before meals. (Patient taking differently: Inject 10 Units into the skin 2 (two) times daily before a meal.)   Insulin Syringe-Needle U-100 (ULTICARE INSULIN SYRINGE) 31G X 5/16" 1 ML MISC Inject 4 times daily subcutaneously   Lancets (ONETOUCH DELICA PLUS [id number]) MISC Use twice daily   levothyroxine (SYNTHROID) 137 MCG tablet Take 137 mcg by mouth daily before breakfast.   losartan (COZAAR) 50 MG tablet TAKE ONE TABLET BY MOUTH DAILY   metFORMIN (GLUCOPHAGE-XR) 500 MG 24 hr tablet Take 1 tablet by mouth 2 times daily   Multiple Vitamin (MULITIVITAMIN WITH MINERALS) TABS Take 1 tablet by mouth daily.   nitroGLYCERIN (NITROSTAT) 0.4  MG SL tablet Place 1 tablet (0.4 mg total) under the tongue every 5 (five) minutes as needed for chest pain.   NOVOLIN N 100 UNIT/ML injection Inject 28 Units into the skin daily before breakfast. Taking 20 units every morning and 8 units every evening   omeprazole (PRILOSEC) 20 MG capsule Take 1 capsule (20  mg total) by mouth 2 (two) times daily before a meal.   potassium chloride SA (KLOR-CON M) 20 MEQ tablet Take 1 tablet (20 mEq total) by mouth daily as needed. Take with Furosemide (Lasix).   rOPINIRole (REQUIP) 0.25 MG tablet Take 1 tablet (0.25 mg total) by mouth 3 (three) times daily.   Vibegron (GEMTESA) 75 MG TABS 1 tablet PO QD   No facility-administered encounter medications on file as of 12/28/2021.   Reviewed chart prior to disease state call. Spoke with patient regarding BP  Recent Office Vitals: BP Readings from Last 3 Encounters:  12/19/21 110/68  11/20/21 130/64  11/16/21 110/80   Pulse Readings from Last 3 Encounters:  12/19/21 78  11/20/21 77  11/16/21 78    Wt Readings from Last 3 Encounters:  12/20/21 181 lb (82.1 kg)  12/19/21 181 lb 9.6 oz (82.4 kg)  11/20/21 181 lb 3.2 oz (82.2 kg)     Kidney Function Lab Results  Component Value Date/Time   CREATININE 0.79 12/19/2021 03:31 PM   CREATININE 0.80 11/20/2021 03:37 PM   CREATININE 0.69 09/06/2021 10:05 AM   CREATININE 0.83 09/07/2020 09:20 AM   GFR 79.08 09/22/2021 10:56 AM   GFRNONAA >60 09/06/2021 10:05 AM   GFRAA 99 12/05/2020 12:00 AM   GFRAA >60 08/14/2019 10:25 AM       Latest Ref Rng & Units 12/19/2021    3:31 PM 11/20/2021    3:37 PM 09/22/2021   10:56 AM  BMP  Glucose 70 - 99 mg/dL 170  135  187   BUN 8 - 27 mg/dL 15  23  15    Creatinine 0.57 - 1.00 mg/dL 0.79  0.80  0.73   BUN/Creat Ratio 12 - 28 19  29     Sodium 134 - 144 mmol/L 138  142  136   Potassium 3.5 - 5.2 mmol/L 4.0  3.9  4.0   Chloride 96 - 106 mmol/L 99  102  101   CO2 20 - 29 mmol/L 25  24  28    Calcium 8.7 - 10.3 mg/dL 10.8  10.8  10.5     Current antihypertensive regimen:  Losartan 50 mg 1 tablet daily - Appropriate, Effective, Safe, Accessible Diltiazem 120 mg 1 capsule daily - Appropriate, Effective, Safe, Accessible How often are you checking your Blood Pressure? infrequently Current home BP readings: 118/58,  128/60 What recent interventions/DTPs have been made by any provider to improve Blood Pressure control since last CPP Visit: Patient reports none Any recent hospitalizations or ED visits since last visit with CPP? No   Adherence Review: Is the patient currently on ACE/ARB medication? Yes Does the patient have >5 day gap between last estimated fill dates? No   Recent Relevant Labs: Lab Results  Component Value Date/Time   HGBA1C 7.4 (H) 09/08/2020 04:16 AM   HGBA1C 6.5 05/05/2020 12:00 AM   HGBA1C 7.5 (H) 10/22/2018 04:45 AM   MICROALBUR 0.8 10/07/2015 01:30 PM   MICROALBUR 1.1 02/08/2014 02:49 PM    Kidney Function Lab Results  Component Value Date/Time   CREATININE 0.79 12/19/2021 03:31 PM   CREATININE 0.80 11/20/2021 03:37 PM  CREATININE 0.69 09/06/2021 10:05 AM   CREATININE 0.83 09/07/2020 09:20 AM   GFR 79.08 09/22/2021 10:56 AM   GFRNONAA >60 09/06/2021 10:05 AM   GFRAA 99 12/05/2020 12:00 AM   GFRAA >60 08/14/2019 10:25 AM    Current antihyperglycemic regimen:  Novolin N 28 units before breakfast - Query Appropriate, Query effective, Safe, Query accessible Novolin R 8 units in morning and at night - Query Appropriate, Query effective, Safe, Query accessible Metformin XR 500 mg 1 tablet twice daily - Appropriate, Query effective, Safe, Accessible What recent interventions/DTPs have been made to improve glycemic control:  Patient reports none she reports she has been doing ok with her sugars and A1C is within limits. No specific readings to report. Have there been any recent hospitalizations or ED visits since last visit with CPP? No Patient denies hypoglycemic symptoms, including None Patient denies hyperglycemic symptoms, including none  Adherence Review: Is the patient currently on a STATIN medication? No Is the patient currently on ACE/ARB medication? Yes Does the patient have >5 day gap between last estimated fill dates? No  Notes: Patient reports her main  concerns at present are her parkinson's and knee issues and she feels like she is getting worse and not better, she reports she had COVID again and it hit her a lot worse the last time she still feels like she is recovering, has been to several appointments and will continue to try and feel better, she reports no concerns to report or ask of pharmacist is aware of upcoming appointment and planning to keep.   Care Gaps: Foot Exam - Overdue HGB A1C - Overdue COVID Booster - Overdue Flu Vaccine - Overdue CCM- 11/23 BP- 118/58 9/23 AWV- 12/22  Star Rating Drugs: Losartan 50 mg - last filled 11/13/2021 90 DS at [facility] Metformin 500 mg - last filled 10/02/2021 90 DS at [facility] Pharmacist Assistant [phone]

## 2021-12-29 ENCOUNTER — Other Ambulatory Visit (HOSPITAL_BASED_OUTPATIENT_CLINIC_OR_DEPARTMENT_OTHER): Payer: Self-pay

## 2021-12-29 ENCOUNTER — Encounter: Payer: Self-pay | Admitting: Hematology

## 2022-01-01 ENCOUNTER — Ambulatory Visit: Payer: Medicare Other | Admitting: Interventional Cardiology

## 2022-01-01 ENCOUNTER — Ambulatory Visit: Payer: Medicare Other | Attending: Cardiology

## 2022-01-01 DIAGNOSIS — I5033 Acute on chronic diastolic (congestive) heart failure: Secondary | ICD-10-CM | POA: Insufficient documentation

## 2022-01-01 DIAGNOSIS — Z952 Presence of prosthetic heart valve: Secondary | ICD-10-CM | POA: Insufficient documentation

## 2022-01-01 DIAGNOSIS — H2513 Age-related nuclear cataract, bilateral: Secondary | ICD-10-CM | POA: Diagnosis not present

## 2022-01-01 DIAGNOSIS — I10 Essential (primary) hypertension: Secondary | ICD-10-CM | POA: Diagnosis not present

## 2022-01-01 DIAGNOSIS — H34212 Partial retinal artery occlusion, left eye: Secondary | ICD-10-CM | POA: Diagnosis not present

## 2022-01-01 DIAGNOSIS — H04123 Dry eye syndrome of bilateral lacrimal glands: Secondary | ICD-10-CM | POA: Diagnosis not present

## 2022-01-01 NOTE — Progress Notes (Signed)
Cardiology Office Note:    Date:  01/02/2022   ID:  [name], DOB [birth year], MRN [id number]  PCP:  [name], NP  Cardiologist:  Sinclair Grooms, MD   Referring MD: [name], NP   Chief Complaint  Patient presents with   Congestive Heart Failure   Coronary Artery Disease   Cardiac Valve Problem    History of Present Illness:    Breanna Webster is a 76 y.o. 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   Saw Dr. Radford Pax 12/19/2021 for acute on chronic diastolic heart failure after not taking furosemide as prescribed during a weeklong cruise in August.  Transiently, the furosemide dose was significantly increased.  She did as advised by Dr. Radford Pax but then for the last 5 days, she has stopped furosemide altogether.  Now she wants to know why her legs are swelling again.  She has some shortness of breath.  Past Medical History:  Diagnosis Date   Anxiety    Arthritis    "back" (04/22/2018)   Back pain    Blood transfusion without reported diagnosis    CAD (coronary artery disease)    a. 03/2018 s/p PCI/DES to the RCA (3.0x15 Onyx DES).   Carotid artery stenosis    Mild   Chest pain    Chronic lower back pain    Cirrhosis (HCC)    Colon polyps    Diverticulitis    Diverticulosis    Esophageal thickening    seen on pre TAVR CT scan, also questionable cirrhosis. MRI recommended. Will refer to GI   Fatty liver    GERD (gastroesophageal reflux disease)    Grave's disease    History of colonic polyps 05/22/2017   History of hiatal hernia    Hypertension    Hypothyroidism    IBS (irritable bowel syndrome)    Osteopenia    Pulmonary nodules     seen on pre TAVR CT. likley benign. no follow up recommended if pt low risk.   S/P TAVR (transcatheter aortic valve replacement)    Severe aortic stenosis    Shortness of breath on exertion    Stroke ([place])    Thalassemia minor    Thyroid disease    Type II diabetes mellitus ([place])     Past Surgical History:  Procedure Laterality Date   50 HOUR [place] STUDY N/A 03/03/2018   Procedure: 24 HOUR PH STUDY;  Surgeon: Mauri Pole, MD;  Location: [facility];  Service: Endoscopy;  Laterality: N/A;   AORTIC VALVE REPLACEMENT  06/2018   COLONOSCOPY     COLONOSCOPY W/ BIOPSIES AND POLYPECTOMY     CORONARY ANGIOGRAPHY Right 04/21/2018   Procedure: CORONARY ANGIOGRAPHY (CATH LAB);  Surgeon: [name], MD;  Location: [name];  Service: Cardiovascular;  Laterality: Right;   CORONARY STENT INTERVENTION N/A 04/22/2018   Procedure: CORONARY STENT INTERVENTION;  Surgeon: [name], MD;  Location: [facility];  Service: Cardiovascular;  Laterality: N/A;   DILATION AND CURETTAGE OF UTERUS     ESOPHAGEAL MANOMETRY N/A 03/03/2018   Procedure: ESOPHAGEAL MANOMETRY (EM);  Surgeon: Mauri Pole, MD;  Location: [facility];  Service: Endoscopy;  Laterality: N/A;  GASTRIC FUNDOPLICATION     HERNIA REPAIR     HYSTEROSCOPY     fibroids   LAPAROSCOPIC CHOLECYSTECTOMY     LAPAROSCOPY     fibroids   NISSEN FUNDOPLICATION  0962E   POLYPECTOMY     RIGHT/LEFT HEART CATH AND CORONARY ANGIOGRAPHY N/A 02/20/2018   Procedure: RIGHT/LEFT HEART CATH AND CORONARY ANGIOGRAPHY;  Surgeon: [name], MD;  Location: [facility];  Service: Cardiovascular;  Laterality: N/A;   TEE WITHOUT CARDIOVERSION N/A 07/08/2018   Procedure: TRANSESOPHAGEAL ECHOCARDIOGRAM (TEE);  Surgeon: Burnell Blanks, MD;  Location: [facility];  Service: Open Heart Surgery;  Laterality: N/A;   TEE WITHOUT CARDIOVERSION  10/07/2018   TEE WITHOUT CARDIOVERSION N/A 10/07/2018   Procedure:  TRANSESOPHAGEAL ECHOCARDIOGRAM (TEE);  Surgeon: Jerline Pain, MD;  Location: [facility];  Service: Cardiovascular;  Laterality: N/A;   TONSILLECTOMY     TRANSCATHETER AORTIC VALVE REPLACEMENT, TRANSFEMORAL N/A 07/08/2018   Procedure: TRANSCATHETER AORTIC VALVE REPLACEMENT, TRANSFEMORAL;  Surgeon: Burnell Blanks, MD;  Location: [facility];  Service: Open Heart Surgery;  Laterality: N/A;    Current Medications: Current Meds  Medication Sig   b complex vitamins capsule Take 1 capsule by mouth daily.   BD PEN NEEDLE NANO 2ND GEN 32G X 4 MM MISC    cholecalciferol (VITAMIN D) 25 MCG (1000 UNIT) tablet Take 1,000 Units by mouth daily.   escitalopram (LEXAPRO) 20 MG tablet TAKE ONE TABLET BY MOUTH DAILY   Evolocumab (REPATHA SURECLICK) 366 MG/ML SOAJ Inject 1 dose into the skin every 14 (fourteen) days.   furosemide (LASIX) 20 MG tablet Take 2 tablets (18m) by mouth daily on 9/13, 9/14, and 9/15. Then take 1 tablet (225m by mouth daily.   glucose blood (ONETOUCH VERIO) test strip USE TO CHECK BLOOD SUGAR THREE TIMES DAILY   Insulin Pen Needle (UNIFINE PENTIPS) 32G X 4 MM MISC Inject 4 times subcutaneously   insulin regular (HUMULIN R) 100 units/mL injection Inject 0.1 mLs (10 Units total) into the skin 3 (three) times daily before meals. (Patient taking differently: Inject 10 Units into the skin 2 (two) times daily before a meal.)   Insulin Syringe-Needle U-100 (ULTICARE INSULIN SYRINGE) 31G X 5/16" 1 ML MISC Inject 4 times daily subcutaneously   Lancets (ONETOUCH DELICA PLUS [id number] Use twice daily   levothyroxine (SYNTHROID) 137 MCG tablet Take 137 mcg by mouth daily before breakfast.   metFORMIN (GLUCOPHAGE-XR) 500 MG 24 hr tablet Take 1 tablet by mouth 2 times daily   Multiple Vitamin (MULITIVITAMIN WITH MINERALS) TABS Take 1 tablet by mouth daily.   nitroGLYCERIN (NITROSTAT) 0.4 MG SL tablet Place 1 tablet (0.4 mg total) under the tongue every 5 (five) minutes as needed for  chest pain.   NOVOLIN N 100 UNIT/ML injection Inject 28 Units into the skin daily before breakfast. Taking 20 units every morning and 8 units every evening   omeprazole (PRILOSEC) 20 MG capsule Take 1 capsule (20 mg total) by mouth 2 (two) times daily before a meal.   potassium chloride SA (KLOR-CON M) 20 MEQ tablet Take 1 tablet (20 mEq total) by mouth daily as needed. Take with Furosemide (Lasix).   rOPINIRole (REQUIP) 0.25 MG tablet Take 1 tablet (0.25 mg total) by mouth 3 (three) times daily.   Vibegron (GEMTESA) 75 MG TABS 1 tablet PO QD   [DISCONTINUED] apixaban (ELIQUIS) 5 MG TABS tablet Take 1 tablet (5 mg total) by mouth 2 (two) times daily.   [DISCONTINUED]  diltiazem (CARDIZEM CD) 120 MG 24 hr capsule Take 1 capsule (120 mg total) by mouth daily.   [DISCONTINUED] furosemide (LASIX) 40 MG tablet Take 1 tablet (40 mg total) by mouth daily. Take 1 tablet twice daily for two day, then 1 tablet once daily   [DISCONTINUED] losartan (COZAAR) 50 MG tablet TAKE ONE TABLET BY MOUTH DAILY     Allergies:   Statins   Social History   Socioeconomic History   Marital status: Married    Spouse name: Not on file   Number of children: 2   Years of education: Not on file   Highest education level: Not on file  Occupational History   Occupation: Tree surgeon of the [employer]  Tobacco Use   Smoking status: Never   Smokeless tobacco: Never  Vaping Use   Vaping Use: Never used  Substance and Sexual Activity   Alcohol use: No    Alcohol/week: 0.0 standard drinks of alcohol   Drug use: Never   Sexual activity: Not Currently  Other Topics Concern   Not on file  Social History Narrative   Lives with husband in a one story home.     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   Education: college.   Social Determinants of Health   Financial Resource Strain: Medium Risk (11/13/2021)   Overall Financial Resource  Strain (CARDIA)    Difficulty of Paying Living Expenses: Somewhat hard  Food Insecurity: No Food Insecurity (04/03/2021)   Hunger Vital Sign    Worried About Running Out of Food in the Last Year: Never true    Ran Out of Food in the Last Year: Never true  Transportation Needs: No Transportation Needs (04/03/2021)   PRAPARE - Hydrologist (Medical): No    Lack of Transportation (Non-Medical): No  Physical Activity: Inactive (04/03/2021)   Exercise Vital Sign    Days of Exercise per Week: 0 days    Minutes of Exercise per Session: 0 min  Stress: No Stress Concern Present (04/03/2021)   [place]    Feeling of Stress : Not at all  Social Connections: [place] (04/03/2021)   Social Connection and Isolation Panel [NHANES]    Frequency of Communication with Friends and Family: More than three times a week    Frequency of Social Gatherings with Friends and Family: More than three times a week    Attends Religious Services: More than 4 times per year    Active Member of [employer] or Organizations: Yes    Attends Music therapist: More than 4 times per year    Marital Status: Married     Family History: The patient's family history includes Headache in an other family member; Healthy in her son; Heart failure in her mother. There is no history of Colon cancer, Pancreatic cancer, Rectal cancer, or Stomach cancer. She was adopted.  ROS:   Please see the history of present illness.    Forgetful to a point that I am concerned about her confusing her medication dosing all other systems reviewed and are negative.  EKGs/Labs/Other Studies Reviewed:    The following studies were reviewed today: No new data  EKG:  EKG not repeated  Recent Labs: 09/22/2021: ALT 14; Hemoglobin 10.9; Platelets 173.0 12/19/2021: NT-Pro BNP 612 01/01/2022: BUN 17; Creatinine, Ser 0.81; Potassium 4.0;  Sodium 140  Recent Lipid  Panel    Component Value Date/Time   CHOL 106 09/06/2021 1018   CHOL 156 11/21/2016 1113   TRIG 101 09/06/2021 1018   HDL 50 09/06/2021 1018   HDL 44 11/21/2016 1113   CHOLHDL 2.1 09/06/2021 1018   VLDL 20 09/06/2021 1018   LDLCALC 36 09/06/2021 1018   LDLCALC 91 11/21/2016 1113   LDLDIRECT 98.0 05/24/2014 1343    Physical Exam:    VS:  BP 128/60   Pulse 76   Ht 5' 5"  (1.651 m)   Wt 183 lb 3.2 oz (83.1 kg)   SpO2 96%   BMI 30.49 kg/m     Wt Readings from Last 3 Encounters:  01/02/22 183 lb 3.2 oz (83.1 kg)  12/20/21 181 lb (82.1 kg)  12/19/21 181 lb 9.6 oz (82.4 kg)     GEN: Weight is higher now than when she saw Dr. Radford Pax.  Thus, stopping furosemide has allowed her to regain significant volume.. No acute distress HEENT: Normal NECK: No JVD. LYMPHATICS: No lymphadenopathy CARDIAC: 2/6 to 3/6 systolic murmur murmur. RRR no gallop, or edema. VASCULAR:  Normal Pulses. No bruits. RESPIRATORY:  Clear to auscultation without rales, wheezing or rhonchi  ABDOMEN: Soft, non-tender, non-distended, No pulsatile mass, MUSCULOSKELETAL: No deformity  SKIN: Warm and dry NEUROLOGIC:  Alert and oriented x 3 PSYCHIATRIC:  Normal affect   ASSESSMENT:    1. Acute on chronic diastolic CHF (congestive heart failure) ([place])   2. S/P TAVR (transcatheter aortic valve replacement)   3. Type 2 diabetes mellitus with complication, with long-term current use of insulin ([place])   4. Essential hypertension   5. PAF (paroxysmal atrial fibrillation) ([place])   6. CAD in native artery   7. Cerebellar stroke, acute ([place])   8. Dyslipidemia   9. Left bundle branch block   10. Medication refill   11. Paroxysmal atrial fibrillation (HCC)   12. Hypertension, unspecified type    PLAN:    In order of problems listed above:  Again, volume overloaded.  Furosemide 40 mg daily for 2 or 3 days until lower extremity edema resolves.  Weigh every day.  Once edema is resolved,  decrease furosemide to 20 mg/day.  Auscultation is stable We will request that she see Dr. Dr. Cherly Anderson to give some assistance with diabetes control.  Part of my request is that she would benefit from SGLT2 therapy.  I am reluctant to add that on top of Glucophage and the multiple insulin moieties that she is currently taking.  I believe if we could get an SGLT2 started, furosemide could be eliminated.  Take potassium every day. Blood pressure control is adequate.  She needs blood work in 1 week.  She needs clinical follow-up 4 weeks.  She can be seen by a team member to assess volume status.   Medication Adjustments/Labs and Tests Ordered: Current medicines are reviewed at length with the patient today.  Concerns regarding medicines are outlined above.  Orders Placed This Encounter  Procedures   Basic metabolic panel   Ambulatory referral to Endocrinology   Meds ordered this encounter  Medications   apixaban (ELIQUIS) 5 MG TABS tablet    Sig: Take 1 tablet (5 mg total) by mouth 2 (two) times daily.    Dispense:  180 tablet    Refill:  3   losartan (COZAAR) 50 MG tablet    Sig: TAKE ONE TABLET BY MOUTH DAILY    Dispense:  90 tablet    Refill:  3   diltiazem (CARDIZEM CD) 120 MG 24 hr capsule    Sig: Take 1 capsule (120 mg total) by mouth daily.    Dispense:  90 capsule    Refill:  3   furosemide (LASIX) 20 MG tablet    Sig: Take 2 tablets (27m) by mouth daily on 9/13, 9/14, and 9/15. Then take 1 tablet (22m by mouth daily.    Dispense:  90 tablet    Refill:  3    Dose change.    Patient Instructions  Medication Instructions:  Your physician has recommended you make the following change in your medication:   1) INCREASE Furosemide (Lasix) to 4015maily for 2-3 days, then take 32m59mily.  *If you need a refill on your cardiac medications before your next appointment, please call your pharmacy*  Lab Work: In 1 week: BMET **You can come by the lab any time between  7:30am and 4:30pm on the day listed above.**  If you have labs (blood work) drawn today and your tests are completely normal, you will receive your results only by: [place] you have MyChart) OR A paper copy in the mail If you have any lab test that is abnormal or we need to change your treatment, we will call you to review the results.  Testing/Procedures: NONE  Follow-Up: [facility] and your health needs are our priority.  As part of our continuing mission to provide you with exceptional heart care, we have created designated Provider Care Teams.  These Care Teams include your primary Cardiologist (physician) and Advanced Practice Providers (APPs -  Physician Assistants and Nurse Practitioners) who all work together to provide you with the care you need, when you need it.  Your next appointment:   1 month(s)  The format for your next appointment:   In Person  Provider:   [place]Christen Bame or ScotRichardson Dopp-C   Other Instructions You have been [facility] of [facility]. Their office will call you to schedule an appointment.  Important Information About Sugar         Signed, HenrSinclair Grooms  01/02/2022 3:47 PM    [facility]

## 2022-01-02 ENCOUNTER — Ambulatory Visit: Payer: Medicare Other | Attending: Interventional Cardiology | Admitting: Interventional Cardiology

## 2022-01-02 ENCOUNTER — Encounter: Payer: Self-pay | Admitting: Interventional Cardiology

## 2022-01-02 ENCOUNTER — Other Ambulatory Visit (HOSPITAL_BASED_OUTPATIENT_CLINIC_OR_DEPARTMENT_OTHER): Payer: Self-pay

## 2022-01-02 VITALS — BP 128/60 | HR 76 | Ht 65.0 in | Wt 183.2 lb

## 2022-01-02 DIAGNOSIS — E785 Hyperlipidemia, unspecified: Secondary | ICD-10-CM | POA: Insufficient documentation

## 2022-01-02 DIAGNOSIS — Z794 Long term (current) use of insulin: Secondary | ICD-10-CM | POA: Diagnosis not present

## 2022-01-02 DIAGNOSIS — E118 Type 2 diabetes mellitus with unspecified complications: Secondary | ICD-10-CM | POA: Diagnosis not present

## 2022-01-02 DIAGNOSIS — I447 Left bundle-branch block, unspecified: Secondary | ICD-10-CM | POA: Diagnosis not present

## 2022-01-02 DIAGNOSIS — Z952 Presence of prosthetic heart valve: Secondary | ICD-10-CM | POA: Diagnosis not present

## 2022-01-02 DIAGNOSIS — I10 Essential (primary) hypertension: Secondary | ICD-10-CM | POA: Insufficient documentation

## 2022-01-02 DIAGNOSIS — I639 Cerebral infarction, unspecified: Secondary | ICD-10-CM | POA: Diagnosis not present

## 2022-01-02 DIAGNOSIS — I5033 Acute on chronic diastolic (congestive) heart failure: Secondary | ICD-10-CM | POA: Insufficient documentation

## 2022-01-02 DIAGNOSIS — I480 Paroxysmal atrial fibrillation: Secondary | ICD-10-CM | POA: Diagnosis not present

## 2022-01-02 DIAGNOSIS — Z760 Encounter for issue of repeat prescription: Secondary | ICD-10-CM | POA: Insufficient documentation

## 2022-01-02 DIAGNOSIS — I2510 Atherosclerotic heart disease of native coronary artery without angina pectoris: Secondary | ICD-10-CM | POA: Insufficient documentation

## 2022-01-02 LAB — BASIC METABOLIC PANEL
BUN/Creatinine Ratio: 21 (ref 12–28)
BUN: 17 mg/dL (ref 8–27)
CO2: 22 mmol/L (ref 20–29)
Calcium: 10.2 mg/dL (ref 8.7–10.3)
Chloride: 102 mmol/L (ref 96–106)
Creatinine, Ser: 0.81 mg/dL (ref 0.57–1.00)
Glucose: 118 mg/dL — ABNORMAL HIGH (ref 70–99)
Potassium: 4 mmol/L (ref 3.5–5.2)
Sodium: 140 mmol/L (ref 134–144)
eGFR: 74 mL/min/{1.73_m2} (ref >59)

## 2022-01-02 MED ORDER — DILTIAZEM HCL ER COATED BEADS 120 MG PO CP24
120.0000 mg | ORAL_CAPSULE | Freq: Every day | ORAL | 3 refills | Status: DC
  Filled 2022-01-02 – 2022-03-12 (×2): qty 90, 90d supply, fill #0
  Filled 2022-06-25 (×2): qty 90, 90d supply, fill #1
  Filled 2022-09-28: qty 60, 60d supply, fill #2
  Filled 2022-11-30: qty 60, 60d supply, fill #3

## 2022-01-02 MED ORDER — LOSARTAN POTASSIUM 50 MG PO TABS
ORAL_TABLET | ORAL | 3 refills | Status: DC
  Filled 2022-01-02: qty 90, fill #0
  Filled 2022-02-09: qty 90, 90d supply, fill #0
  Filled 2022-05-09: qty 90, 90d supply, fill #1

## 2022-01-02 MED ORDER — APIXABAN 5 MG PO TABS
5.0000 mg | ORAL_TABLET | Freq: Two times a day (BID) | ORAL | 3 refills | Status: DC
  Filled 2022-01-02 – 2022-01-05 (×2): qty 180, 90d supply, fill #0
  Filled 2022-01-09: qty 60, 30d supply, fill #0
  Filled 2022-02-09: qty 60, 30d supply, fill #1
  Filled 2022-03-12: qty 60, 30d supply, fill #2
  Filled 2022-04-11: qty 60, 30d supply, fill #3
  Filled 2022-05-09: qty 60, 30d supply, fill #4
  Filled 2022-06-07: qty 60, 30d supply, fill #5
  Filled 2022-07-17: qty 60, 30d supply, fill #6
  Filled 2022-08-18: qty 60, 30d supply, fill #7
  Filled 2022-09-18: qty 60, 30d supply, fill #8
  Filled 2022-10-20: qty 60, 30d supply, fill #9
  Filled 2022-11-19: qty 60, 30d supply, fill #10
  Filled 2022-12-18: qty 60, 30d supply, fill #11

## 2022-01-02 MED ORDER — FUROSEMIDE 20 MG PO TABS
ORAL_TABLET | ORAL | 3 refills | Status: DC
  Filled 2022-01-02: qty 90, 87d supply, fill #0

## 2022-01-02 NOTE — Patient Instructions (Signed)
Medication Instructions:  Your physician has recommended you make the following change in your medication:   1) INCREASE Furosemide (Lasix) to 63m daily for 2-3 days, then take 262mdaily.  *If you need a refill on your cardiac medications before your next appointment, please call your pharmacy*  Lab Work: In 1 week: BMET **You can come by the lab any time between 7:30am and 4:30pm on the day listed above.**  If you have labs (blood work) drawn today and your tests are completely normal, you will receive your results only by: My[place] you have MyChart) OR A paper copy in the mail If you have any lab test that is abnormal or we need to change your treatment, we will call you to review the results.  Testing/Procedures: NONE  Follow-Up: [facility] needs are our priority.  As part of our continuing mission to provide you with exceptional heart care, we have created designated Provider Care Teams.  These Care Teams include your primary Cardiologist (physician) and Advanced Practice Providers (APPs -  Physician Assistants and Nurse Practitioners) who all work together to provide you with the care you need, when you need it.  Your next appointment:   1 month(s)  The format for your next appointment:   In Person  Provider:   MiChristen BameNP or ScRichardson DoppPA-C   Other Instructions You have been [facility]. Their office will call you to schedule an appointment.  Important Information About Sugar

## 2022-01-05 ENCOUNTER — Other Ambulatory Visit (HOSPITAL_BASED_OUTPATIENT_CLINIC_OR_DEPARTMENT_OTHER): Payer: Self-pay

## 2022-01-09 ENCOUNTER — Ambulatory Visit: Payer: Medicare Other | Attending: Interventional Cardiology

## 2022-01-09 ENCOUNTER — Encounter: Payer: Self-pay | Admitting: Hematology

## 2022-01-09 ENCOUNTER — Other Ambulatory Visit (HOSPITAL_BASED_OUTPATIENT_CLINIC_OR_DEPARTMENT_OTHER): Payer: Self-pay

## 2022-01-09 DIAGNOSIS — I10 Essential (primary) hypertension: Secondary | ICD-10-CM | POA: Diagnosis not present

## 2022-01-10 ENCOUNTER — Other Ambulatory Visit (HOSPITAL_BASED_OUTPATIENT_CLINIC_OR_DEPARTMENT_OTHER): Payer: Self-pay

## 2022-01-10 LAB — BASIC METABOLIC PANEL
BUN/Creatinine Ratio: 26 (ref 12–28)
BUN: 23 mg/dL (ref 8–27)
CO2: 18 mmol/L — ABNORMAL LOW (ref 20–29)
Calcium: 10.3 mg/dL (ref 8.7–10.3)
Chloride: 100 mmol/L (ref 96–106)
Creatinine, Ser: 0.9 mg/dL (ref 0.57–1.00)
Glucose: 189 mg/dL — ABNORMAL HIGH (ref 70–99)
Potassium: 4.2 mmol/L (ref 3.5–5.2)
Sodium: 138 mmol/L (ref 134–144)
eGFR: 65 mL/min/{1.73_m2} (ref >59)

## 2022-01-11 ENCOUNTER — Other Ambulatory Visit (HOSPITAL_BASED_OUTPATIENT_CLINIC_OR_DEPARTMENT_OTHER): Payer: Self-pay

## 2022-01-11 ENCOUNTER — Other Ambulatory Visit: Payer: Self-pay | Admitting: Adult Health

## 2022-01-11 ENCOUNTER — Encounter: Payer: Self-pay | Admitting: Gastroenterology

## 2022-01-11 ENCOUNTER — Telehealth: Payer: Self-pay

## 2022-01-11 ENCOUNTER — Ambulatory Visit (INDEPENDENT_AMBULATORY_CARE_PROVIDER_SITE_OTHER): Payer: Medicare Other | Admitting: Gastroenterology

## 2022-01-11 VITALS — BP 118/58 | HR 79 | Ht 65.0 in | Wt 178.0 lb

## 2022-01-11 DIAGNOSIS — R109 Unspecified abdominal pain: Secondary | ICD-10-CM

## 2022-01-11 DIAGNOSIS — I5030 Unspecified diastolic (congestive) heart failure: Secondary | ICD-10-CM | POA: Diagnosis not present

## 2022-01-11 DIAGNOSIS — I639 Cerebral infarction, unspecified: Secondary | ICD-10-CM | POA: Diagnosis not present

## 2022-01-11 DIAGNOSIS — K7460 Unspecified cirrhosis of liver: Secondary | ICD-10-CM | POA: Diagnosis not present

## 2022-01-11 DIAGNOSIS — K2270 Barrett's esophagus without dysplasia: Secondary | ICD-10-CM | POA: Diagnosis not present

## 2022-01-11 DIAGNOSIS — R194 Change in bowel habit: Secondary | ICD-10-CM | POA: Diagnosis not present

## 2022-01-11 DIAGNOSIS — Z86010 Personal history of colonic polyps: Secondary | ICD-10-CM

## 2022-01-11 MED ORDER — LOPERAMIDE HCL 2 MG PO TABS: 2.0000 mg | ORAL_TABLET | ORAL | 0 refills | Status: DC | PRN

## 2022-01-11 MED ORDER — DICYCLOMINE HCL 10 MG PO CAPS
10.0000 mg | ORAL_CAPSULE | Freq: Three times a day (TID) | ORAL | 1 refills | Status: DC | PRN
  Filled 2022-01-11: qty 30, 10d supply, fill #0

## 2022-01-11 NOTE — Telephone Encounter (Signed)
This patient is in need of an EGD/Colonoscopy but Dr. Havery Moros, [facility],  would like your evaluation/clearance before we schedule her procedures. She has an upcoming appointment with you on October 20th.  Would you please address this at that upcoming and advise Dr. [place] Cellar of any concerns that would prevent [name] from moving forward with scheduling her? Thank you.

## 2022-01-11 NOTE — Progress Notes (Signed)
HPI :  76 year old female here for a follow-up visit for MASH cirrhosis, short segment Barrett's, history of colon polyps.  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   Since have last seen her she has been feeling well.  She has had problems with lower extremity edema and shortness of breath from her diastolic heart failure.  She had a flare of this in August and then again last week.  She has been on and off diuretics.  Saw her cardiologist last week and had Lasix resumed.  This has helped her breathing and her lower extremity edema however she still fairly dyspneic on exertion and having hard time ambulating.  She took some time walking into the office today.  She had a right upper quadrant ultrasound since of last seen her which showed stable changes of cirrhosis of the liver.  No evidence of HCC.  No decompensating events since I have last seen her.  She states her bowels have fluctuated a bit since have seen her.  Previously had significant diarrhea the last time I saw her, had a negative GI pathogen panel and fecal lactoferrin.  She states that got better however still having occasional loose stools with some urgency.  She is wondering if drinking coffee might be related to this.  She has had some cramping discomfort in her left lower quadrant at times with this.  She is otherwise taking omeprazole 20 mg daily for her history of short segment Barrett's and GERD.  She states her GERD is controlled.  No dysphagia.  Recall she has had 36 polyps removed on her last 2 colonoscopies, adenomas, she is due for a surveillance colonoscopy in October.   Recall she has been thought to have cirrhosis related to fatty liver based on multiple ultrasound imaging  studies. She has been compensated over time. She underwent a serologic evaluation in 2017 which was negative.  She does not drink any alcohol.  She denies any family history of cirrhosis.  She has had no issues with ascites, jaundice, encephalopathy, nor varices.  She was immunized to hepatitis A and B in 2017.  Imaging of her abdomen with an ultrasound in Sept 2022, liver was cirrhotic with a 2 cm nodule for which MRI was recommended.  She underwent an MRI of her abdomen with and without contrast on February 04 2021.  Interestingly there was no concerning nodule in her liver but also her liver contour is smooth and there is no obvious cirrhosis on the MRI.  Her spleen is normal.    Of note she had another CT scan of her abdomen pelvis with contrast since have last seen her, this time December 2022.  This was done for a fall and they were ruling out trauma.  She had reported "stable changes of cirrhosis" of her liver on that exam.  Most recent ultrasound suggest cirrhosis.   Endoscopic history: EGD 02/03/19 - irregular zline, biopsies c/w nondysplastic BE, loose nissen, no varices, benign duodenal polyp Colonoscopy 02/03/19 - 8 small adenomas, diverticulosis, lipoma EGD 03/20/2017 - irregular z line, LA grade B esophagitis, small paraesophageal hernia, loose Nissen wrap, mild gastritis, duodenal erosions - path c/w Barrett's, H pylori negative  Colonoscopy 03/20/2017 - pancolonic diverticulosis, 28 polyps removed -  EGD 2002 - suspected segment of  BE, no BE on biopsies EGD 2006 - esophagitis and gastritis, no evidence of BE Colonoscopy 5/05 - diverticulosis Colonoscopy 4/15 - 4 small adenomas  RUQ [name] 10/18/21: IMPRESSION: 1. Cirrhotic morphology to the liver with coarsened echotexture and a mildly nodular contour. 2. No other abnormalities.    Echo 03/21/21: EF 60-65%, mild LVH, grade I DD   Past Medical History:  Diagnosis Date   Anxiety    Arthritis    "back" (04/22/2018)   Back pain     Blood transfusion without reported diagnosis    CAD (coronary artery disease)    a. 03/2018 s/p PCI/DES to the RCA (3.0x15 Onyx DES).   Carotid artery stenosis    Mild   Chest pain    Chronic lower back pain    Cirrhosis (HCC)    Colon polyps    Diverticulitis    Diverticulosis    Esophageal thickening    seen on pre TAVR CT scan, also questionable cirrhosis. MRI recommended. Will refer to GI   Fatty liver    GERD (gastroesophageal reflux disease)    Grave's disease    History of colonic polyps 05/22/2017   History of hiatal hernia    Hypertension    Hypothyroidism    IBS (irritable bowel syndrome)    Osteopenia    Pulmonary nodules    seen on pre TAVR CT. likley benign. no follow up recommended if pt low risk.   S/P TAVR (transcatheter aortic valve replacement)    Severe aortic stenosis    Shortness of breath on exertion    Stroke ([place])    Thalassemia minor    Thyroid disease    Type II diabetes mellitus ([place])      Past Surgical History:  Procedure Laterality Date   8 HOUR [place] STUDY N/A 03/03/2018   Procedure: 24 HOUR PH STUDY;  Surgeon: Mauri Pole, MD;  Location: [facility];  Service: Endoscopy;  Laterality: N/A;   AORTIC VALVE REPLACEMENT  06/2018   COLONOSCOPY     COLONOSCOPY W/ BIOPSIES AND POLYPECTOMY     CORONARY ANGIOGRAPHY Right 04/21/2018   Procedure: CORONARY ANGIOGRAPHY (CATH LAB);  Surgeon: [name], MD;  Location: [facility];  Service: Cardiovascular;  Laterality: Right;   CORONARY STENT INTERVENTION N/A 04/22/2018   Procedure: CORONARY STENT INTERVENTION;  Surgeon: [name], MD;  Location: [facility];  Service: Cardiovascular;  Laterality: N/A;   DILATION AND CURETTAGE OF UTERUS     ESOPHAGEAL MANOMETRY N/A 03/03/2018   Procedure: ESOPHAGEAL MANOMETRY (EM);  Surgeon: Mauri Pole, MD;  Location: [facility];  Service: Endoscopy;  Laterality: N/A;   GASTRIC FUNDOPLICATION     HERNIA REPAIR     HYSTEROSCOPY      fibroids   LAPAROSCOPIC CHOLECYSTECTOMY     LAPAROSCOPY     fibroids   NISSEN FUNDOPLICATION  4496P   POLYPECTOMY     RIGHT/LEFT HEART CATH AND CORONARY ANGIOGRAPHY N/A 02/20/2018   Procedure: RIGHT/LEFT HEART CATH AND CORONARY ANGIOGRAPHY;  Surgeon: [name], MD;  Location: [facility];  Service: Cardiovascular;  Laterality: N/A;   TEE WITHOUT CARDIOVERSION N/A 07/08/2018   Procedure: TRANSESOPHAGEAL ECHOCARDIOGRAM (TEE);  Surgeon: Burnell Blanks, MD;  Location: [facility];  Service: Open Heart Surgery;  Laterality: N/A;   TEE WITHOUT CARDIOVERSION  10/07/2018   TEE WITHOUT CARDIOVERSION N/A 10/07/2018   Procedure: TRANSESOPHAGEAL ECHOCARDIOGRAM (TEE);  Surgeon: Jerline Pain, MD;  Location: [facility] ENDOSCOPY;  Service:  Cardiovascular;  Laterality: N/A;   TONSILLECTOMY     TRANSCATHETER AORTIC VALVE REPLACEMENT, TRANSFEMORAL N/A 07/08/2018   Procedure: TRANSCATHETER AORTIC VALVE REPLACEMENT, TRANSFEMORAL;  Surgeon: Burnell Blanks, MD;  Location: [facility];  Service: Open Heart Surgery;  Laterality: N/A;   Family History  Adopted: Yes  Problem Relation Age of Onset   Healthy Son        x 2   Headache Other        Cluster headaches   Heart failure Mother    Colon cancer Neg Hx    Pancreatic cancer Neg Hx    Rectal cancer Neg Hx    Stomach cancer Neg Hx    Social History   Tobacco Use   Smoking status: Never   Smokeless tobacco: Never  Vaping Use   Vaping Use: Never used  Substance Use Topics   Alcohol use: No    Alcohol/week: 0.0 standard drinks of alcohol   Drug use: Never   Current Outpatient Medications  Medication Sig Dispense Refill   apixaban (ELIQUIS) 5 MG TABS tablet Take 1 tablet (5 mg total) by mouth 2 (two) times daily. 180 tablet 3   b complex vitamins capsule Take 1 capsule by mouth daily.     BD PEN NEEDLE NANO 2ND GEN 32G X 4 MM MISC      cholecalciferol (VITAMIN D) 25 MCG (1000 UNIT) tablet Take 1,000 Units by mouth daily.      diltiazem (CARDIZEM CD) 120 MG 24 hr capsule Take 1 capsule (120 mg total) by mouth daily. 90 capsule 3   escitalopram (LEXAPRO) 20 MG tablet TAKE ONE TABLET BY MOUTH DAILY 90 tablet 1   Evolocumab (REPATHA SURECLICK) 174 MG/ML SOAJ Inject 1 dose into the skin every 14 (fourteen) days. 2 mL 11   furosemide (LASIX) 20 MG tablet Take 2 tablets (70m) by mouth daily on 9/13, 9/14, and 9/15. Then take 1 tablet (237m by mouth daily. 90 tablet 3   glucose blood (ONETOUCH VERIO) test strip USE TO CHECK BLOOD SUGAR THREE TIMES DAILY 300 strip 4   Insulin Pen Needle (UNIFINE PENTIPS) 32G X 4 MM MISC Inject 4 times subcutaneously 400 each 2   insulin regular (HUMULIN R) 100 units/mL injection Inject 0.1 mLs (10 Units total) into the skin 3 (three) times daily before meals. (Patient taking differently: Inject 10 Units into the skin 2 (two) times daily before a meal.) 10 mL 0   Insulin Syringe-Needle U-100 (ULTICARE INSULIN SYRINGE) 31G X 5/16" 1 ML MISC Inject 4 times daily subcutaneously 100 each 1   Lancets (ONETOUCH DELICA PLUS LABSWHQP59FMISC Use twice daily 200 each 1   levothyroxine (SYNTHROID) 137 MCG tablet Take 137 mcg by mouth daily before breakfast.     losartan (COZAAR) 50 MG tablet TAKE ONE TABLET BY MOUTH DAILY 90 tablet 3   metFORMIN (GLUCOPHAGE-XR) 500 MG 24 hr tablet Take 1 tablet by mouth 2 times daily 180 tablet 2   Multiple Vitamin (MULITIVITAMIN WITH MINERALS) TABS Take 1 tablet by mouth daily.     nitroGLYCERIN (NITROSTAT) 0.4 MG SL tablet Place 1 tablet (0.4 mg total) under the tongue every 5 (five) minutes as needed for chest pain. 25 tablet 4   NOVOLIN N 100 UNIT/ML injection Inject 28 Units into the skin daily before breakfast. Taking 20 units every morning and 8 units every evening     omeprazole (PRILOSEC) 20 MG capsule Take 1 capsule (20 mg total) by mouth 2 (two) times daily  before a meal. 60 capsule 3   potassium chloride SA (KLOR-CON M) 20 MEQ tablet Take 1 tablet (20 mEq total)  by mouth daily as needed. Take with Furosemide (Lasix). 90 tablet 3   rOPINIRole (REQUIP) 0.25 MG tablet Take 1 tablet (0.25 mg total) by mouth 3 (three) times daily. 90 tablet 5   No current facility-administered medications for this visit.   Allergies  Allergen Reactions   Statins Other (See Comments)    Muscle aches     Review of Systems: All systems reviewed and negative except where noted in HPI.   Lab Results  Component Value Date   WBC 6.8 09/22/2021   HGB 10.9 (L) 09/22/2021   HCT 34.9 (L) 09/22/2021   MCV 64.2 Repeated and verified X2. (L) 09/22/2021   PLT 173.0 09/22/2021   Lab Results  Component Value Date   ALT 14 09/22/2021   AST 28 09/22/2021   ALKPHOS 93 09/22/2021   BILITOT 1.0 09/22/2021     Lab Results  Component Value Date   CREATININE 0.90 01/09/2022   BUN 23 01/09/2022   NA 138 01/09/2022   K 4.2 01/09/2022   CL 100 01/09/2022   CO2 18 (L) 01/09/2022   Lab Results  Component Value Date   INR 1.6 (H) 12/27/2020   INR 1.8 (H) 09/08/2020   INR 1.2 04/19/2020     Physical Exam: BP (!) 118/58   Pulse 79   Ht 5' 5"  (1.651 m)   Wt 178 lb (80.7 kg)   BMI 29.62 kg/m  Constitutional: Pleasant,well-developed, female in no acute distress. Neurological: Alert and oriented to person place and time. Psychiatric: Normal mood and affect. Behavior is normal.   ASSESSMENT: 76 y.o. female here for assessment of the following  1. Altered bowel habits   2. Abdominal cramps   3. Hepatic cirrhosis, unspecified hepatic cirrhosis type, unspecified whether ascites present ([place])   4. Barrett's esophagus without dysplasia   5. History of colon polyps   6. Diastolic congestive heart failure, unspecified HF chronicity ([place])    Patient with some intermittent altered bowel habits occasional loose stools and abdominal cramping.  Not as bad as it was previously.  Her fecal lactoferrin was negative.  She is due for surveillance colonoscopy at this time given  numerous polyps in the past.  We discussed if this is something she wanted to pursue given her age and comorbidities.  She definitely does want to pursue this in light of her bowel changes, however, her heart failure is not well controlled at this time and she been having recurrent edema and dyspnea.  She needs to have better control of her CHF prior to considering colonoscopy.  She is due to see cardiology in a few weeks for reassessment.  Once she has clearance from them and stable we can consider colonoscopy.  Recommend she take Imodium as needed for loose stools and Bentyl 10 mg every 8 hours for abdominal cramps.  She is also due for surveillance EGD to screen for varices and reassess Barrett's esophagus.  She is on omeprazole, stable dosing with good control of symptoms.  Again will need better control of her CHF prior to consideration for EGD, await her follow-up cardiology appointment for clearance.  Otherwise discussed her cirrhosis with her, risks for decompensation and HCC.  She is currently compensated.  No evidence of HCC, continue [place] screening every 6 months and labs every 6 months.  PLAN: - needs EGD and colonoscopy however is not  stable from cardiac perspective yet for this - she needs cardiac clearance, due to see them on Oct 20th, and clearance to hold Eliquis, will await her follow up appointment with cardiology prior to scheduling - bentyl 28m every 8 hours PRN #30 RF1 - immodium PRN - recall RUQ [place] LFT[place], AFP in December  SJolly Mango MD [facility]

## 2022-01-11 NOTE — Patient Instructions (Addendum)
If you are age 78 or older, your body mass index should be between 23-30. Your Body mass index is 29.62 kg/m. If this is out of the aforementioned range listed, please consider follow up with your Primary Care Provider.  If you are age 57 or younger, your body mass index should be between 19-25. Your Body mass index is 29.62 kg/m. If this is out of the aformentioned range listed, please consider follow up with your Primary Care Provider.   ________________________________________________________  We have sent the following medications to your pharmacy for you to pick up at your convenience: Bentyl 10 mg: Take one tablet every hours as needed  Take Imodium as needed  We will contact Cardiology regarding clearance for your procedures and be in touch regarding scheduling.  Thank you for entrusting me with your care and for choosing [facility], Dr. Varina Cellar

## 2022-01-12 ENCOUNTER — Other Ambulatory Visit (HOSPITAL_BASED_OUTPATIENT_CLINIC_OR_DEPARTMENT_OTHER): Payer: Self-pay

## 2022-01-12 ENCOUNTER — Encounter: Payer: Self-pay | Admitting: Hematology

## 2022-01-12 MED ORDER — "INSULIN SYRINGE-NEEDLE U-100 31G X 5/16"" 1 ML MISC"
1 refills | Status: DC
  Filled 2022-01-12: qty 200, 50d supply, fill #0

## 2022-01-12 NOTE — Telephone Encounter (Signed)
Yes. I will make a note of that and address at her visit on 10/20. [address] [place], [place]    01/12/2022 11:54 AM

## 2022-01-15 ENCOUNTER — Other Ambulatory Visit (HOSPITAL_BASED_OUTPATIENT_CLINIC_OR_DEPARTMENT_OTHER): Payer: Self-pay

## 2022-01-17 ENCOUNTER — Other Ambulatory Visit (HOSPITAL_BASED_OUTPATIENT_CLINIC_OR_DEPARTMENT_OTHER): Payer: Self-pay

## 2022-01-18 ENCOUNTER — Other Ambulatory Visit (HOSPITAL_BASED_OUTPATIENT_CLINIC_OR_DEPARTMENT_OTHER): Payer: Self-pay

## 2022-01-21 ENCOUNTER — Other Ambulatory Visit (HOSPITAL_BASED_OUTPATIENT_CLINIC_OR_DEPARTMENT_OTHER): Payer: Self-pay

## 2022-01-22 ENCOUNTER — Other Ambulatory Visit: Payer: Self-pay | Admitting: Adult Health

## 2022-01-22 ENCOUNTER — Other Ambulatory Visit (HOSPITAL_BASED_OUTPATIENT_CLINIC_OR_DEPARTMENT_OTHER): Payer: Self-pay

## 2022-01-23 ENCOUNTER — Other Ambulatory Visit (HOSPITAL_BASED_OUTPATIENT_CLINIC_OR_DEPARTMENT_OTHER): Payer: Self-pay

## 2022-01-23 ENCOUNTER — Encounter: Payer: Self-pay | Admitting: Hematology

## 2022-01-23 ENCOUNTER — Other Ambulatory Visit: Payer: Self-pay | Admitting: Adult Health

## 2022-01-23 MED ORDER — INSULIN REGULAR HUMAN 100 UNIT/ML IJ SOLN
10.0000 [IU] | Freq: Three times a day (TID) | INTRAMUSCULAR | 0 refills | Status: DC
  Filled 2022-01-23: qty 10, 34d supply, fill #0

## 2022-01-23 NOTE — Telephone Encounter (Signed)
Last OV re DM was 10/12/21. Notes from that visit:   DM Type 2 - is seen by Endocrinology but she is looking to switch endocrinologists ( referral has been placed).  She is currently managed with metformin extended release 500 mg twice daily, Novolin 28 units in the morning and  insulin R 8 units 3 times daily before meals

## 2022-01-24 ENCOUNTER — Other Ambulatory Visit (HOSPITAL_BASED_OUTPATIENT_CLINIC_OR_DEPARTMENT_OTHER): Payer: Self-pay

## 2022-01-24 DIAGNOSIS — E1165 Type 2 diabetes mellitus with hyperglycemia: Secondary | ICD-10-CM | POA: Diagnosis not present

## 2022-01-24 DIAGNOSIS — E039 Hypothyroidism, unspecified: Secondary | ICD-10-CM | POA: Diagnosis not present

## 2022-01-24 DIAGNOSIS — I10 Essential (primary) hypertension: Secondary | ICD-10-CM | POA: Diagnosis not present

## 2022-01-24 MED ORDER — LEVOTHYROXINE SODIUM 137 MCG PO TABS
137.0000 ug | ORAL_TABLET | Freq: Every day | ORAL | 1 refills | Status: DC
  Filled 2022-01-24: qty 90, 90d supply, fill #0
  Filled 2022-05-09: qty 90, 90d supply, fill #1

## 2022-01-28 ENCOUNTER — Other Ambulatory Visit (HOSPITAL_BASED_OUTPATIENT_CLINIC_OR_DEPARTMENT_OTHER): Payer: Self-pay

## 2022-01-28 MED ORDER — INSULIN REGULAR HUMAN 100 UNIT/ML IJ SOLN
INTRAMUSCULAR | 5 refills | Status: DC
  Filled 2022-02-09 – 2022-02-12 (×2): qty 10, 30d supply, fill #0

## 2022-01-31 ENCOUNTER — Encounter: Payer: Self-pay | Admitting: Adult Health

## 2022-02-01 NOTE — Telephone Encounter (Signed)
[facility] for referral for pt autoimmune disease?

## 2022-02-02 DIAGNOSIS — M170 Bilateral primary osteoarthritis of knee: Secondary | ICD-10-CM | POA: Diagnosis not present

## 2022-02-02 NOTE — Telephone Encounter (Signed)
Please advise 

## 2022-02-02 NOTE — Telephone Encounter (Signed)
Per patient mychart note, provider suggested patient come in to be see. Patient's spouse called in requesting that she be seen today. Advised that there were no openings today, patient's spouse still wishing for patient to be seen today. Please advise

## 2022-02-06 ENCOUNTER — Encounter: Payer: Self-pay | Admitting: Adult Health

## 2022-02-06 ENCOUNTER — Ambulatory Visit (INDEPENDENT_AMBULATORY_CARE_PROVIDER_SITE_OTHER): Payer: Medicare Other | Admitting: Adult Health

## 2022-02-06 ENCOUNTER — Other Ambulatory Visit (HOSPITAL_BASED_OUTPATIENT_CLINIC_OR_DEPARTMENT_OTHER): Payer: Self-pay

## 2022-02-06 VITALS — BP 100/70 | HR 79 | Temp 97.5°F

## 2022-02-06 DIAGNOSIS — M25512 Pain in left shoulder: Secondary | ICD-10-CM | POA: Diagnosis not present

## 2022-02-06 DIAGNOSIS — R5383 Other fatigue: Secondary | ICD-10-CM

## 2022-02-06 DIAGNOSIS — R42 Dizziness and giddiness: Secondary | ICD-10-CM

## 2022-02-06 DIAGNOSIS — I639 Cerebral infarction, unspecified: Secondary | ICD-10-CM

## 2022-02-06 LAB — CBC WITH DIFFERENTIAL/PLATELET
Basophils Absolute: 0.1 10*3/uL (ref 0.0–0.1)
Basophils Relative: 1 % (ref 0.0–3.0)
Eosinophils Absolute: 0 10*3/uL (ref 0.0–0.7)
Eosinophils Relative: 0.6 % (ref 0.0–5.0)
HCT: 35.3 % — ABNORMAL LOW (ref 36.0–46.0)
Hemoglobin: 11 g/dL — ABNORMAL LOW (ref 12.0–15.0)
Lymphocytes Relative: 22.8 % (ref 12.0–46.0)
Lymphs Abs: 1.7 10*3/uL (ref 0.7–4.0)
MCHC: 31.2 g/dL (ref 30.0–36.0)
MCV: 63.2 fl — ABNORMAL LOW (ref 78.0–100.0)
Monocytes Absolute: 0.4 10*3/uL (ref 0.1–1.0)
Monocytes Relative: 5.1 % (ref 3.0–12.0)
Neutro Abs: 5.2 10*3/uL (ref 1.4–7.7)
Neutrophils Relative %: 70.5 % (ref 43.0–77.0)
Platelets: 202 10*3/uL (ref 150.0–400.0)
RBC: 5.59 Mil/uL — ABNORMAL HIGH (ref 3.87–5.11)
RDW: 15.5 % (ref 11.5–15.5)
WBC: 7.3 10*3/uL (ref 4.0–10.5)

## 2022-02-06 MED ORDER — ONDANSETRON HCL 4 MG PO TABS
4.0000 mg | ORAL_TABLET | Freq: Three times a day (TID) | ORAL | 0 refills | Status: DC | PRN
  Filled 2022-02-06: qty 20, 7d supply, fill #0

## 2022-02-06 MED ORDER — MECLIZINE HCL 25 MG PO TABS
25.0000 mg | ORAL_TABLET | Freq: Three times a day (TID) | ORAL | 0 refills | Status: DC | PRN
  Filled 2022-02-06: qty 30, 10d supply, fill #0

## 2022-02-06 MED ORDER — TRAMADOL HCL 50 MG PO TABS
50.0000 mg | ORAL_TABLET | Freq: Every evening | ORAL | 0 refills | Status: AC
  Filled 2022-02-06: qty 7, 7d supply, fill #0

## 2022-02-06 NOTE — Progress Notes (Signed)
Subjective:    Patient ID: Breanna Webster, female    DOB: [birth year], 76 y.o.   MRN: [id number]  HPI 76 year old female who  has a past medical history of Anxiety, Arthritis, Back pain, Blood transfusion without reported diagnosis, CAD (coronary artery disease), Carotid artery stenosis, Chest pain, Chronic lower back pain, Cirrhosis (Breanna Webster), Colon polyps, Diverticulitis, Diverticulosis, Esophageal thickening, Fatty liver, GERD (gastroesophageal reflux disease), Grave's disease, History of colonic polyps (05/22/2017), History of hiatal hernia, Hypertension, Hypothyroidism, IBS (irritable bowel syndrome), Osteopenia, Pulmonary nodules, S/P TAVR (transcatheter aortic valve replacement), Severe aortic stenosis, Shortness of breath on exertion, Stroke (Breanna Webster), Thalassemia minor, Thyroid disease, and Type II diabetes mellitus (Breanna Webster).  She presents to the office today for multiple issues   She presents to the office today for multiple issues, she is with her husband.  She reports worsening right knee pain, currently being seen by Dr. Wynelle Link, most recent knee injection "some type of gel" did not work."  They do not want to do surgery on the knee for her chronic medical conditions.  She also reports refer to left-sided shoulder pain with loss of range of motion over the last 2 to 3 weeks.  She does report that she is waiting on a appointment from [facility] for this.  She denies trauma or falls.  Pain is mostly located in the left shoulder itself but does radiate down the forearm.  She is unable to raise her arm over her head or scratch her back without severe discomfort.  Additionally, she has been suffering from vertigo while the world is spinning around her.  She had an episode yesterday in which she had some nausea and dry heaving but no vomiting.  She felt better today until she got to the office and the dizziness restarted.  She is adamant about being tested for autoimmune diseases due to her joint  pain and chronic fatigue.  She did have a negative ANA back in 2017.  If she does have an autoimmune disease she would to be referred to a rheumatologist  Review of Systems See HPI   Past Medical History:  Diagnosis Date   Anxiety    Arthritis    "back" (04/22/2018)   Back pain    Blood transfusion without reported diagnosis    CAD (coronary artery disease)    a. 03/2018 s/p PCI/DES to the RCA (3.0x15 Onyx DES).   Carotid artery stenosis    Mild   Chest pain    Chronic lower back pain    Cirrhosis (HCC)    Colon polyps    Diverticulitis    Diverticulosis    Esophageal thickening    seen on pre TAVR CT scan, also questionable cirrhosis. MRI recommended. Will refer to GI   Fatty liver    GERD (gastroesophageal reflux disease)    Grave's disease    History of colonic polyps 05/22/2017   History of hiatal hernia    Hypertension    Hypothyroidism    IBS (irritable bowel syndrome)    Osteopenia    Pulmonary nodules    seen on pre TAVR CT. likley benign. no follow up recommended if pt low risk.   S/P TAVR (transcatheter aortic valve replacement)    Severe aortic stenosis    Shortness of breath on exertion    Stroke (Breanna Webster)    Thalassemia minor    Thyroid disease    Type II diabetes mellitus (Breanna Webster)     Social History   Socioeconomic History  Marital status: Married    Spouse name: Not on file   Number of children: 2   Years of education: Not on file   Highest education level: Not on file  Occupational History   Occupation: Tree surgeon of the [employer]  Tobacco Use   Smoking status: Never   Smokeless tobacco: Never  Vaping Use   Vaping Use: Never used  Substance and Sexual Activity   Alcohol use: No    Alcohol/week: 0.0 standard drinks of alcohol   Drug use: Never   Sexual activity: Not Currently  Other Topics Concern   Not on file  Social History Narrative   Lives with husband in a one story home.     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   Education: college.   Social Determinants of Health   Financial Resource Strain: Medium Risk (11/13/2021)   Overall Financial Resource Strain (CARDIA)    Difficulty of Paying Living Expenses: Somewhat hard  Food Insecurity: No Food Insecurity (04/03/2021)   Hunger Vital Sign    Worried About Running Out of Food in the Last Year: Never true    Ran Out of Food in the Last Year: Never true  Transportation Needs: No Transportation Needs (04/03/2021)   PRAPARE - Hydrologist (Medical): No    Lack of Transportation (Non-Medical): No  Physical Activity: Inactive (04/03/2021)   Exercise Vital Sign    Days of Exercise per Week: 0 days    Minutes of Exercise per Session: 0 min  Stress: No Stress Concern Present (04/03/2021)   [place]    Feeling of Stress : Not at all  Social Connections: [place] (04/03/2021)   Social Connection and Isolation Panel [NHANES]    Frequency of Communication with Friends and Family: More than three times a week    Frequency of Social Gatherings with Friends and Family: More than three times a week    Attends Religious Services: More than 4 times per year    Active Member of Clubs or Organizations: Yes    Attends Archivist Meetings: More than 4 times per year    Marital Status: Married  Human resources officer Violence: Not At Risk (04/03/2021)   Humiliation, Afraid, Rape, and Kick questionnaire    Fear of Current or Ex-Partner: No    Emotionally Abused: No    Physically Abused: No    Sexually Abused: No    Past Surgical History:  Procedure Laterality Date   6 HOUR [place] STUDY N/A 03/03/2018   Procedure: 24 HOUR [place] STUDY;  Surgeon: Mauri Pole, MD;  Location: [facility];  Service: Endoscopy;  Laterality: N/A;   AORTIC VALVE REPLACEMENT  06/2018   COLONOSCOPY      COLONOSCOPY W/ BIOPSIES AND POLYPECTOMY     CORONARY ANGIOGRAPHY Right 04/21/2018   Procedure: CORONARY ANGIOGRAPHY (CATH LAB);  Surgeon: [name], MD;  Location: [place] [facility];  Service: Cardiovascular;  Laterality: Right;   CORONARY STENT INTERVENTION N/A 04/22/2018   Procedure: CORONARY STENT INTERVENTION;  Surgeon: [name], MD;  Location: [place] [facility];  Service: Cardiovascular;  Laterality: N/A;   DILATION AND CURETTAGE OF UTERUS     ESOPHAGEAL MANOMETRY N/A 03/03/2018   Procedure: ESOPHAGEAL MANOMETRY (EM);  Surgeon: Mauri Pole, MD;  Location: [facility];  Service: Endoscopy;  Laterality: N/A;  GASTRIC FUNDOPLICATION     HERNIA REPAIR     HYSTEROSCOPY     fibroids   LAPAROSCOPIC CHOLECYSTECTOMY     LAPAROSCOPY     fibroids   NISSEN FUNDOPLICATION  7915A   POLYPECTOMY     RIGHT/LEFT HEART CATH AND CORONARY ANGIOGRAPHY N/A 02/20/2018   Procedure: RIGHT/LEFT HEART CATH AND CORONARY ANGIOGRAPHY;  Surgeon: [name], MD;  Location: [facility];  Service: Cardiovascular;  Laterality: N/A;   TEE WITHOUT CARDIOVERSION N/A 07/08/2018   Procedure: TRANSESOPHAGEAL ECHOCARDIOGRAM (TEE);  Surgeon: Burnell Blanks, MD;  Location: [place];  Service: Open Heart Surgery;  Laterality: N/A;   TEE WITHOUT CARDIOVERSION  10/07/2018   TEE WITHOUT CARDIOVERSION N/A 10/07/2018   Procedure: TRANSESOPHAGEAL ECHOCARDIOGRAM (TEE);  Surgeon: Jerline Pain, MD;  [facility];  Service: Cardiovascular;  Laterality: N/A;   TONSILLECTOMY     TRANSCATHETER AORTIC VALVE REPLACEMENT, TRANSFEMORAL N/A 07/08/2018   Procedure: TRANSCATHETER AORTIC VALVE REPLACEMENT, TRANSFEMORAL;  Surgeon: Burnell Blanks, MD;  Location: [place];  Service: Open Heart Surgery;  Laterality: N/A;    Family History  Adopted: Yes  Problem Relation Age of Onset   Healthy Son        x 2   Headache Other        Cluster headaches   Heart failure Mother    Colon cancer  Neg Hx    Pancreatic cancer Neg Hx    Rectal cancer Neg Hx    Stomach cancer Neg Hx     Allergies  Allergen Reactions   Statins Other (See Comments)    Muscle aches    Current Outpatient Medications on File Prior to Visit  Medication Sig Dispense Refill   apixaban (ELIQUIS) 5 MG TABS tablet Take 1 tablet (5 mg total) by mouth 2 (two) times daily. 180 tablet 3   b complex vitamins capsule Take 1 capsule by mouth daily.     BD PEN NEEDLE NANO 2ND GEN 32G X 4 MM MISC      cholecalciferol (VITAMIN D) 25 MCG (1000 UNIT) tablet Take 1,000 Units by mouth daily.     dicyclomine (BENTYL) 10 MG capsule Take 1 capsule (10 mg total) by mouth every 8 (eight) hours as needed for spasms. 30 capsule 1   diltiazem (CARDIZEM CD) 120 MG 24 hr capsule Take 1 capsule (120 mg total) by mouth daily. 90 capsule 3   escitalopram (LEXAPRO) 20 MG tablet TAKE ONE TABLET BY MOUTH DAILY 90 tablet 1   Evolocumab (REPATHA SURECLICK) 569 MG/ML SOAJ Inject 1 dose into the skin every 14 (fourteen) days. 2 mL 11   furosemide (LASIX) 20 MG tablet Take 2 tablets (71m) by mouth daily on 9/13, 9/14, and 9/15. Then take 1 tablet (244m by mouth daily. 90 tablet 3   glucose blood (ONETOUCH VERIO) test strip USE TO CHECK BLOOD SUGAR THREE TIMES DAILY 300 strip 4   Insulin Pen Needle (UNIFINE PENTIPS) 32G X 4 MM MISC Inject 4 times subcutaneously 400 each 2   insulin regular (HUMULIN R) 100 units/mL injection Inject 10 Units into the skin 3 (three) times daily before meals. 10 mL 5   Insulin Syringe-Needle U-100 (ULTICARE INSULIN SYRINGE) 31G X 5/16" 1 ML MISC Inject 4 times daily subcutaneously 100 each 1   Lancets (ONETOUCH DELICA PLUS [id number] Use twice daily 200 each 1   levothyroxine (SYNTHROID) 137 MCG tablet Take 137 mcg by mouth daily before breakfast.     levothyroxine (  SYNTHROID) 137 MCG tablet Take 1 tablet by mouth every morning on an empty stomach  BRAND NAME ONLY 90 tablet 1   loperamide (IMODIUM A-D) 2 MG  tablet Take 1 tablet (2 mg total) by mouth as needed for diarrhea or loose stools. 30 tablet 0   losartan (COZAAR) 50 MG tablet TAKE ONE TABLET BY MOUTH DAILY 90 tablet 3   metFORMIN (GLUCOPHAGE-XR) 500 MG 24 hr tablet Take 1 tablet by mouth 2 times daily 180 tablet 2   Multiple Vitamin (MULITIVITAMIN WITH MINERALS) TABS Take 1 tablet by mouth daily.     nitroGLYCERIN (NITROSTAT) 0.4 MG SL tablet Place 1 tablet (0.4 mg total) under the tongue every 5 (five) minutes as needed for chest pain. 25 tablet 4   NOVOLIN N 100 UNIT/ML injection Inject 28 Units into the skin daily before breakfast. Taking 20 units every morning and 8 units every evening     omeprazole (PRILOSEC) 20 MG capsule Take 1 capsule (20 mg total) by mouth 2 (two) times daily before a meal. 60 capsule 3   potassium chloride SA (KLOR-CON M) 20 MEQ tablet Take 1 tablet (20 mEq total) by mouth daily as needed. Take with Furosemide (Lasix). 90 tablet 3   rOPINIRole (REQUIP) 0.25 MG tablet Take 1 tablet (0.25 mg total) by mouth 3 (three) times daily. 90 tablet 5   No current facility-administered medications on file prior to visit.    BP 100/70   Pulse 79   Temp (!) 97.5 F (36.4 C) (Oral)   SpO2 100%      Objective:   Physical Exam Vitals and nursing note reviewed.  Constitutional:      Appearance: Normal appearance. She is ill-appearing (chronically).  Eyes:     Extraocular Movements:     Right eye: Nystagmus (horizontal) present.     Left eye: Nystagmus (horizontal) present.  Cardiovascular:     Rate and Rhythm: Normal rate and regular rhythm.     Pulses: Normal pulses.     Heart sounds: Normal heart sounds.  Pulmonary:     Effort: Pulmonary effort is normal.     Breath sounds: Normal breath sounds.  Musculoskeletal:     Left shoulder: Bony tenderness and crepitus present. Decreased range of motion. Decreased strength.     Comments: Positive back scratch test and empty can test. Negative Spurling's test.  Skin:     General: Skin is warm and dry.  Neurological:     General: No focal deficit present.     Mental Status: She is alert and oriented to person, place, and time.  Psychiatric:        Mood and Affect: Mood normal.        Behavior: Behavior normal.        Thought Content: Thought content normal.        Judgment: Judgment normal.       Assessment & Plan:  1. Acute pain of left shoulder - Appears to be rotator cuff injury  - She does not want a steroid injection in the office and will wait until she is seen by orthopedics. Will give short course of tramadol.  - traMADol (ULTRAM) 50 MG tablet; Take 1 tablet (50 mg total) by mouth at bedtime for 5 days.  Dispense: 15 tablet; Refill: 0 - CBC with Differential/Platelet - Iron, TIBC and Ferritin Panel - Rheumatoid Factor - ANA  2. Other fatigue - likely not due to autoimmune disease but likely from medications and chronic dieaseses -  ANA; Future - Rheumatoid Factor; Future - Iron, TIBC and Ferritin Panel; Future - CBC with Differential/Platelet; Future - CBC with Differential/Platelet - Iron, TIBC and Ferritin Panel - Rheumatoid Factor - ANA  3. Vertigo  - meclizine (ANTIVERT) 25 MG tablet; Take 1 tablet (25 mg total) by mouth 3 (three) times daily as needed for dizziness.  Dispense: 30 tablet; Refill: 0 - ondansetron (ZOFRAN) 4 MG tablet; Take 1 tablet (4 mg total) by mouth every 8 (eight) hours as needed for nausea or vomiting.  Dispense: 20 tablet; Refill: 0  [name], NP  Time spent with patient today was 34 minutes which consisted of chart review, discussing diagnosis, work up, treatment answering questions and documentation.

## 2022-02-07 DIAGNOSIS — N3281 Overactive bladder: Secondary | ICD-10-CM | POA: Diagnosis not present

## 2022-02-07 DIAGNOSIS — N3941 Urge incontinence: Secondary | ICD-10-CM | POA: Diagnosis not present

## 2022-02-07 LAB — IRON,TIBC AND FERRITIN PANEL
%SAT: 40 % (calc) (ref 16–45)
Ferritin: 78 ng/mL (ref 16–288)
Iron: 129 ug/dL (ref 45–160)
TIBC: 320 mcg/dL (calc) (ref 250–450)

## 2022-02-07 LAB — RHEUMATOID FACTOR: Rheumatoid fact SerPl-aCnc: <14 IU/mL (ref <14)

## 2022-02-07 LAB — ANA: Anti Nuclear Antibody (ANA): NEGATIVE

## 2022-02-08 NOTE — Progress Notes (Signed)
Cardiology Office Note:    Date:  02/09/2022   ID:  Breanna Webster, DOB April 16, 1946, MRN [id number]  PCP:  [name], NP  [facility] Cardiologist:  Sinclair Grooms, MD    Referring MD: [name], NP   Chief Complaint:  F/u for CHF; Surgical Clearance    Patient Profile: Coronary artery disease  S/p PCI w 3 x 15 mm Onyx DES to mRCA in 03/2018 Hx of MV endocarditis in 09/2018 Paroxysmal atrial fibrillation  (HFpEF) heart failure with preserved ejection fraction  Severe aortic stenosis  S/p TAVR in 06/2018 Carotid artery stenosis  Diabetes mellitus  Graves disease/hypothyroidism  NASH w cirrhosis  Hx of embolic cerebellar CVA in 10/2018 Hypertension  Hyperlipidemia Fe deficiency anemia  Thalassemia minor Left Bundle Branch Block  Prior CV Studies: Carotid US 07/19/2021 Bilateral ICA 1-39; atypical antegrade flow in left vertebral artery  ECHO COMPLETE WO IMAGING ENHANCING AGENT 03/21/2021 EF 60-65, no RWMA, mild LVH, G1 DD, GLS -19.9, normal RVSF, mild mitral stenosis (mean gradient 4.5 mmHg), s/p TAVR with normal structure and function (mean gradient 15, V-max 243 cm/s), RAP 3   CARDIAC TELEMETRY MONITORING-INTERPRETATION ONLY 12/09/2018  NSR  Non sustained WCT, 14 beats, likely SVT  Occasional PVC's  No atrial fibrillation    Cardiac catheterization 04/22/2018 RCA proximal 85 PCI: 3 x 15 resolute Onyx DES to the RCA  History of Present Illness:   Breanna Webster is a 76 y.o. female with the above problem list.  She was last seen by Dr. Tamala Julian 01/02/2022.  She has stopped taking furosemide again and was volume overloaded.  She was placed back on furosemide.  She is brought back for close follow-up.  She also needs surgical clearance for EGD/Colonoscopy with Dr. Havery Moros.  She returns for follow-up.  She is here with her husband.  She has a lot of issues with arthritis and has not been able to do her regular exercise in several  months.  She has a lot of knee pain as well as left shoulder pain.  She notes that she has a rotator cuff injury on the left.  She has chronic shortness of breath with exertion.  This has not gotten any worse since her last visit.  She has not had chest pain.  She sleeps on an incline secondary to acid reflux.  Her lower extremity edema persist but is somewhat better.  She has not had syncope.  She has not been able to take furosemide on a daily basis due to frequent urination.  She notes that she sees urology for overactive bladder.  She notes that she is up all night with frequent urination.  I have explained to her that furosemide is not likely the cause of her nocturia as long as she takes it early in the morning.    Past Medical History:  Diagnosis Date   Anxiety    Arthritis    "back" (04/22/2018)   Back pain    Blood transfusion without reported diagnosis    CAD (coronary artery disease)    a. 03/2018 s/p PCI/DES to the RCA (3.0x15 Onyx DES).   Carotid artery stenosis    Mild   Chest pain    Chronic lower back pain    Cirrhosis (HCC)    Colon polyps    Diverticulitis    Diverticulosis    Esophageal thickening    seen on pre TAVR CT scan, also questionable cirrhosis. MRI recommended. Will refer to GI  Fatty liver    GERD (gastroesophageal reflux disease)    Grave's disease    History of colonic polyps 05/22/2017   History of hiatal hernia    Hypertension    Hypothyroidism    IBS (irritable bowel syndrome)    Osteopenia    Pulmonary nodules    seen on pre TAVR CT. likley benign. no follow up recommended if pt low risk.   S/P TAVR (transcatheter aortic valve replacement)    Severe aortic stenosis    Shortness of breath on exertion    Stroke (HCC)    Thalassemia minor    Thyroid disease    Type II diabetes mellitus (HCC)    Current Medications: Current Meds  Medication Sig   apixaban (ELIQUIS) 5 MG TABS tablet Take 1 tablet (5 mg total) by mouth 2 (two) times daily.   b  complex vitamins capsule Take 1 capsule by mouth daily.   BD PEN NEEDLE NANO 2ND GEN 32G X 4 MM MISC    cholecalciferol (VITAMIN D) 25 MCG (1000 UNIT) tablet Take 1,000 Units by mouth daily.   diltiazem (CARDIZEM CD) 120 MG 24 hr capsule Take 1 capsule (120 mg total) by mouth daily.   escitalopram (LEXAPRO) 20 MG tablet TAKE ONE TABLET BY MOUTH DAILY   Evolocumab (REPATHA SURECLICK) 151 MG/ML SOAJ Inject 1 dose into the skin every 14 (fourteen) days.   furosemide (LASIX) 20 MG tablet Take 2 tablets (49m) by mouth daily on 9/13, 9/14, and 9/15. Then take 1 tablet (258m by mouth daily. (Patient taking differently: Take 20 mg by mouth every other day. Take 2 tablets (4092mby mouth daily on 9/13, 9/14, and 9/15. Then take 1 tablet (75m81my mouth daily.)   glucose blood (ONETOUCH VERIO) test strip USE TO CHECK BLOOD SUGAR THREE TIMES DAILY   Insulin Pen Needle (UNIFINE PENTIPS) 32G X 4 MM MISC Inject 4 times subcutaneously   insulin regular (HUMULIN R) 100 units/mL injection Inject 10 Units into the skin 3 (three) times daily before meals.   Insulin Syringe-Needle U-100 (ULTICARE INSULIN SYRINGE) 31G X 5/16" 1 ML MISC Inject 4 times daily subcutaneously   Lancets (ONETOUCH DELICA PLUS [id number] Use twice daily   levothyroxine (SYNTHROID) 137 MCG tablet Take 1 tablet by mouth every morning on an empty stomach  BRAND NAME ONLY   loperamide (IMODIUM A-D) 2 MG tablet Take 1 tablet (2 mg total) by mouth as needed for diarrhea or loose stools.   losartan (COZAAR) 50 MG tablet TAKE ONE TABLET BY MOUTH DAILY   meclizine (ANTIVERT) 25 MG tablet Take 1 tablet (25 mg total) by mouth 3 (three) times daily as needed for dizziness.   metFORMIN (GLUCOPHAGE-XR) 500 MG 24 hr tablet Take 1 tablet by mouth 2 times daily   Multiple Vitamin (MULITIVITAMIN WITH MINERALS) TABS Take 1 tablet by mouth daily.   nitroGLYCERIN (NITROSTAT) 0.4 MG SL tablet Place 1 tablet (0.4 mg total) under the tongue every 5 (five)  minutes as needed for chest pain.   NOVOLIN N 100 UNIT/ML injection Inject 28 Units into the skin daily before breakfast. Taking 20 units every morning and 8 units every evening   omeprazole (PRILOSEC) 20 MG capsule Take 1 capsule (20 mg total) by mouth 2 (two) times daily before a meal.   ondansetron (ZOFRAN) 4 MG tablet Take 1 tablet (4 mg total) by mouth every 8 (eight) hours as needed for nausea or vomiting.   potassium chloride SA (KLOR-CON M) 20 MEQ  tablet Take 1 tablet (20 mEq total) by mouth daily as needed. Take with Furosemide (Lasix).   traMADol (ULTRAM) 50 MG tablet Take 1 tablet (50 mg total) by mouth at bedtime for 5 days.    Allergies:   Statins   Social History   Tobacco Use   Smoking status: Never   Smokeless tobacco: Never  Vaping Use   Vaping Use: Never used  Substance Use Topics   Alcohol use: No    Alcohol/week: 0.0 standard drinks of alcohol   Drug use: Never    Family Hx: The patient's family history includes Headache in an other family member; Healthy in her son; Heart failure in her mother. There is no history of Colon cancer, Pancreatic cancer, Rectal cancer, or Stomach cancer. She was adopted.  Review of Systems  Gastrointestinal:  Negative for hematochezia.  Genitourinary:  Negative for hematuria.     EKGs/Labs/Other Test Reviewed:    EKG:  EKG is not ordered today.    Recent Labs: 09/22/2021: ALT 14 12/19/2021: NT-Pro BNP 612 01/09/2022: BUN 23; Creatinine, Ser 0.90; Potassium 4.2; Sodium 138 02/06/2022: Hemoglobin 11.0; Platelets 202.0   Recent Lipid Panel Recent Labs    09/06/21 1018  CHOL 106  TRIG 101  HDL 50  VLDL 20  LDLCALC 36     Risk Assessment/Calculations/Metrics:    CHA2DS2-VASc Score = 9   This indicates a 12.2% annual risk of stroke. The patient's score is based upon: CHF History: 1 HTN History: 1 Diabetes History: 1 Stroke History: 2 Vascular Disease History: 1 Age Score: 2 Gender Score: 1             Physical  Exam:    VS:  BP 100/62   Pulse 86   Ht 5' 5"  (1.651 m)   Wt 178 lb (80.7 kg)   SpO2 96%   BMI 29.62 kg/m     Wt Readings from Last 3 Encounters:  02/09/22 178 lb (80.7 kg)  01/11/22 178 lb (80.7 kg)  01/02/22 183 lb 3.2 oz (83.1 kg)    Constitutional:      Appearance: Healthy appearance. Not in distress.  Neck:     Vascular: No JVR.  Pulmonary:     Effort: Pulmonary effort is normal.     Breath sounds: No wheezing. No rales.  Cardiovascular:     Normal rate. Regular rhythm. Normal S1. Normal S2.      Murmurs: There is a grade 2/6 systolic murmur at the URSB.  Edema:    Pretibial: bilateral trace edema of the pretibial area.    Ankle: bilateral trace edema of the ankle. Abdominal:     Palpations: Abdomen is soft.  Skin:    General: Skin is warm and dry.  Neurological:     Mental Status: Alert and oriented to person, place and time.         ASSESSMENT & PLAN:   Preoperative cardiovascular examination She needs EGD and colonoscopy. Her perioperative risk of major cardiac event is high at 11%. Her functional capacity is limited. I do not think she can achieve 4 METs. However, I do not think that stress testing is indicated. I will review further with Dr. Tamala Julian. I will also review with our PharmD to determine the appropriate amount of time to hold Eliquis for her procedure.   ADDENDUM 02/15/2022 12:04 PM  Reviewed with PharmD team. Per office protocol, the patient can hold Eliquis for 1 day prior to her procedure. She will not need bridging  with Lovenox (enoxaparin) around procedure. *If GI requests more than 1 day hold, we will need to review with primary cardiologist - as she would most likely need to bridge with Lovenox.*  I reviewed with Dr. Tamala Julian. He agrees that, as noted, she is high risk for any procedure. Her risk is not prohibitive. She does not require further testing. She may proceed as planned with her EGD/Colonoscopy.  (HFpEF) heart failure with preserved  ejection fraction (HCC) NYHA IIb-III. Volume overall seems stable. Her dyspnea is likely multifactorial and due to HF, CAD, valvular heart disease, deconditioning. Her BP limits titration of GDMT. I do not think she can tolerate Entresto. She has a lot of bladder issues and only takes Lasix every other day. Therefore, I do not think she will tolerate Spironolactone. She takes insulin and I am hesitant to start SGLT2 inhib unless her PCP/endocrinologist clears her to do so. This was also recommended by Dr. Tamala Julian at last visit. She was referred to endocrine but has not seen anyone yet. I have tried to encourage her to take the Lasix every day. The nocturia is likely all due to her bladder issues and not the Lasix. I have reiterated that taking Lasix on a daily basis will improve her breathing the most. Obtain F/u BMET today. F/u in 3 mos.   Coronary artery disease involving native coronary artery of native heart without angina pectoris Hx of DES to RCA in 2019. She is not having anginal symptoms. Continue Repatha 140 mg q 2 weeks.   Severe aortic stenosis S/p TAVR in 2020. TAVR functioning normally on echocardiogram in Nov 2022.  Continue SBE prophylaxis.  Paroxysmal atrial fibrillation (HCC) Maintaining sinus rhythm on exam.  Continue Eliquis 5 mg twice daily.CHA2DS2-VASc Score = 9 [CHF History: 1, HTN History: 1, Diabetes History: 1, Stroke History: 2, Vascular Disease History: 1, Age Score: 2, Gender Score: 1].  Therefore, the patient's annual risk of stroke is 12.2 %. As noted, will review with PharmD on how long to hold Eliquis for her upcoming EGD/colo.           Dispo:  Return in about 3 months (around 05/12/2022) for Routine Follow Up.   Medication Adjustments/Labs and Tests Ordered: Current medicines are reviewed at length with the patient today.  Concerns regarding medicines are outlined above.  Tests Ordered: Orders Placed This Encounter  Procedures   Basic metabolic panel   Medication  Changes: No orders of the defined types were placed in this encounter.  Signed, Richardson Dopp, PA-C  02/09/2022 2:08 PM    [facility] [address], [place], [place]  [address] Phone: [phone]; Fax: [phone]

## 2022-02-09 ENCOUNTER — Other Ambulatory Visit (HOSPITAL_BASED_OUTPATIENT_CLINIC_OR_DEPARTMENT_OTHER): Payer: Self-pay

## 2022-02-09 ENCOUNTER — Encounter: Payer: Self-pay | Admitting: Hematology

## 2022-02-09 ENCOUNTER — Ambulatory Visit: Payer: Medicare Other | Attending: Physician Assistant | Admitting: Physician Assistant

## 2022-02-09 ENCOUNTER — Other Ambulatory Visit: Payer: Self-pay | Admitting: Adult Health

## 2022-02-09 ENCOUNTER — Encounter: Payer: Self-pay | Admitting: Physician Assistant

## 2022-02-09 VITALS — BP 100/62 | HR 86 | Ht 65.0 in | Wt 178.0 lb

## 2022-02-09 DIAGNOSIS — I5032 Chronic diastolic (congestive) heart failure: Secondary | ICD-10-CM | POA: Insufficient documentation

## 2022-02-09 DIAGNOSIS — I480 Paroxysmal atrial fibrillation: Secondary | ICD-10-CM | POA: Insufficient documentation

## 2022-02-09 DIAGNOSIS — I2510 Atherosclerotic heart disease of native coronary artery without angina pectoris: Secondary | ICD-10-CM | POA: Diagnosis not present

## 2022-02-09 DIAGNOSIS — I350 Nonrheumatic aortic (valve) stenosis: Secondary | ICD-10-CM

## 2022-02-09 DIAGNOSIS — Z01810 Encounter for preprocedural cardiovascular examination: Secondary | ICD-10-CM | POA: Diagnosis not present

## 2022-02-09 DIAGNOSIS — I639 Cerebral infarction, unspecified: Secondary | ICD-10-CM | POA: Diagnosis not present

## 2022-02-09 LAB — BASIC METABOLIC PANEL
BUN/Creatinine Ratio: 21 (ref 12–28)
BUN: 18 mg/dL (ref 8–27)
CO2: 22 mmol/L (ref 20–29)
Calcium: 10.6 mg/dL — ABNORMAL HIGH (ref 8.7–10.3)
Chloride: 101 mmol/L (ref 96–106)
Creatinine, Ser: 0.85 mg/dL (ref 0.57–1.00)
Glucose: 165 mg/dL — ABNORMAL HIGH (ref 70–99)
Potassium: 4 mmol/L (ref 3.5–5.2)
Sodium: 137 mmol/L (ref 134–144)
eGFR: 70 mL/min/{1.73_m2} (ref >59)

## 2022-02-09 NOTE — Assessment & Plan Note (Signed)
S/p TAVR in 2020. TAVR functioning normally on echocardiogram in Nov 2022.  Continue SBE prophylaxis.

## 2022-02-09 NOTE — Assessment & Plan Note (Signed)
Maintaining sinus rhythm on exam.  Continue Eliquis 5 mg twice daily.CHA2DS2-VASc Score = 9 [CHF History: 1, HTN History: 1, Diabetes History: 1, Stroke History: 2, Vascular Disease History: 1, Age Score: 2, Gender Score: 1].  Therefore, the patient's annual risk of stroke is 12.2 %. As noted, will review with PharmD on how long to hold Eliquis for her upcoming EGD/colo.

## 2022-02-09 NOTE — Patient Instructions (Signed)
Medication Instructions:  Your physician recommends that you continue on your current medications as directed. Please refer to the Current Medication list given to you today.  You should try to take a Lasix every day   *If you need a refill on your cardiac medications before your next appointment, please call your pharmacy*   Lab Work: TODAY:  BMET  If you have labs (blood work) drawn today and your tests are completely normal, you will receive your results only by: [place] (if you have MyChart) OR A paper copy in the mail If you have any lab test that is abnormal or we need to change your treatment, we will call you to review the results.   Testing/Procedures: None ordered   Follow-Up: At [facility], you and your health needs are our priority.  As part of our continuing mission to provide you with exceptional heart care, we have created designated Provider Care Teams.  These Care Teams include your primary Cardiologist (physician) and Advanced Practice Providers (APPs -  Physician Assistants and Nurse Practitioners) who all work together to provide you with the care you need, when you need it.  We recommend signing up for the patient portal called "MyChart".  Sign up information is provided on this After Visit Summary.  MyChart is used to connect with patients for Virtual Visits (Telemedicine).  Patients are able to view lab/test results, encounter notes, upcoming appointments, etc.  Non-urgent messages can be sent to your provider as well.   To learn more about what you can do with MyChart, go to [email or web address].    Your next appointment:   3 month(s)  The format for your next appointment:   In Person  Provider:   Sinclair Grooms, MD     Other Instructions   Important Information About Sugar

## 2022-02-09 NOTE — Telephone Encounter (Signed)
Will route to PharmD for rec's re: holding anticoagulation. Please route back to me (do not send to Preop) Richardson Dopp, PA-C    02/09/2022 12:52 PM

## 2022-02-09 NOTE — Telephone Encounter (Signed)
Dr. Havery Moros - please see cardiac clearance from today

## 2022-02-09 NOTE — Telephone Encounter (Signed)
Patient with diagnosis of atrial fibrillation on Eliquis for anticoagulation.    Procedure: EGD/colonoscopy Date of procedure: TBD   CHA2DS2-VASc Score = 9   This indicates a 12.2% annual risk of stroke. The patient's score is based upon: CHF History: 1 HTN History: 1 Diabetes History: 1 Stroke History: 2 Vascular Disease History: 1 Age Score: 2 Gender Score: 1   CrCl 66 Platelet count 202  Chart indicates embolic cerebellar CVA 04/6107  Per office protocol, patient can hold Eliquis for 1 days prior to procedure.   Patient will not need bridging with Lovenox (enoxaparin) around procedure.  If GI requests more than 1 day hold will need to review with primary cardiologist - would most likely need to bridge  **This guidance is not considered finalized until pre-operative APP has relayed final recommendations.**

## 2022-02-09 NOTE — Assessment & Plan Note (Addendum)
NYHA IIb-III. Volume overall seems stable. Her dyspnea is likely multifactorial and due to HF, CAD, valvular heart disease, deconditioning. Her BP limits titration of GDMT. I do not think she can tolerate Entresto. She has a lot of bladder issues and only takes Lasix every other day. Therefore, I do not think she will tolerate Spironolactone. She takes insulin and I am hesitant to start SGLT2 inhib unless her PCP/endocrinologist clears her to do so. This was also recommended by Dr. Tamala Julian at last visit. She was referred to endocrine but has not seen anyone yet. I have tried to encourage her to take the Lasix every day. The nocturia is likely all due to her bladder issues and not the Lasix. I have reiterated that taking Lasix on a daily basis will improve her breathing the most. Obtain F/u BMET today. F/u in 3 mos.

## 2022-02-09 NOTE — Assessment & Plan Note (Signed)
Hx of DES to RCA in 2019. She is not having anginal symptoms. Continue Repatha 140 mg q 2 weeks.

## 2022-02-09 NOTE — Assessment & Plan Note (Signed)
She needs EGD and colonoscopy. Her perioperative risk of major cardiac event is high at 11%. Her functional capacity is limited. I do not think she can achieve 4 METs. However, I do not think that stress testing is indicated. I will review further with Dr. Tamala Julian. I will also review with our PharmD to determine the appropriate amount of time to hold Eliquis for her procedure.

## 2022-02-09 NOTE — Telephone Encounter (Signed)
Thanks Jan, that should be okay.  I would like her to hold this \24 hours though from the time of her procedure, not just later in the day before.  Thanks

## 2022-02-10 ENCOUNTER — Other Ambulatory Visit (HOSPITAL_BASED_OUTPATIENT_CLINIC_OR_DEPARTMENT_OTHER): Payer: Self-pay

## 2022-02-12 ENCOUNTER — Other Ambulatory Visit (HOSPITAL_BASED_OUTPATIENT_CLINIC_OR_DEPARTMENT_OTHER): Payer: Self-pay

## 2022-02-12 NOTE — Telephone Encounter (Signed)
Called and spoke to patient. Scheduled her for [facility] in November with Dr. Havery Moros and nurse PV on 11-7. Patient aware to go to 2nd floor on 11-7. She needs to hold Eliquis for 1 day  - see telephone note from 9-21

## 2022-02-13 ENCOUNTER — Other Ambulatory Visit (HOSPITAL_BASED_OUTPATIENT_CLINIC_OR_DEPARTMENT_OTHER): Payer: Self-pay

## 2022-02-13 DIAGNOSIS — M25512 Pain in left shoulder: Secondary | ICD-10-CM | POA: Diagnosis not present

## 2022-02-14 ENCOUNTER — Other Ambulatory Visit (HOSPITAL_BASED_OUTPATIENT_CLINIC_OR_DEPARTMENT_OTHER): Payer: Self-pay

## 2022-02-14 ENCOUNTER — Encounter (HOSPITAL_BASED_OUTPATIENT_CLINIC_OR_DEPARTMENT_OTHER): Payer: Self-pay | Admitting: Pharmacist

## 2022-02-15 NOTE — Progress Notes (Signed)
Thanks for the follow up and help. I think one day would be okay for the Eliquis. We always would prefer 2 days, but I don't think worth putting her on a lovenox bridge for it. Thanks

## 2022-02-17 ENCOUNTER — Other Ambulatory Visit (HOSPITAL_BASED_OUTPATIENT_CLINIC_OR_DEPARTMENT_OTHER): Payer: Self-pay

## 2022-02-19 ENCOUNTER — Other Ambulatory Visit (HOSPITAL_BASED_OUTPATIENT_CLINIC_OR_DEPARTMENT_OTHER): Payer: Self-pay

## 2022-02-19 ENCOUNTER — Telehealth: Payer: Self-pay

## 2022-02-19 NOTE — Telephone Encounter (Signed)
--  States patient's Rx for Humulin N Quick Pen was cancelled Aug 29th, 2023 by Leanora Cover RN. States patient is questioning why. Caller states last filled in August for 30 days, states patient has outher diabetic meds and insulin she is currently taking. Advised to contact office on Mon after 8 am for clarification of issue  02/17/2022 10:24:29 AM Clinical Call Windle Guard, RN, Olin Hauser  02/19/22 1109 - Pt is seeing endocrinologist Bunnie Philips [phone] & contacted them this morning for clarification. Pt states she has enough long acting insulin for today. Will call to endo today to get clarification. Called [facility] & left message for them to refill the medication or call her pharmacy for rationale on medication change. They are aware that pt has no insulin for tomorrow.

## 2022-02-20 ENCOUNTER — Telehealth: Payer: Self-pay | Admitting: Interventional Cardiology

## 2022-02-20 ENCOUNTER — Other Ambulatory Visit (HOSPITAL_BASED_OUTPATIENT_CLINIC_OR_DEPARTMENT_OTHER): Payer: Self-pay

## 2022-02-20 ENCOUNTER — Encounter (HOSPITAL_BASED_OUTPATIENT_CLINIC_OR_DEPARTMENT_OTHER): Payer: Self-pay | Admitting: Pharmacist

## 2022-02-20 MED ORDER — HUMULIN N KWIKPEN 100 UNIT/ML ~~LOC~~ SUPN
PEN_INJECTOR | SUBCUTANEOUS | 6 refills | Status: DC
  Filled 2022-02-20: qty 15, 44d supply, fill #0
  Filled 2022-04-02: qty 15, 44d supply, fill #1
  Filled 2022-05-09: qty 15, 44d supply, fill #2
  Filled 2022-07-17: qty 15, 44d supply, fill #3
  Filled 2022-08-18 – 2022-08-27 (×2): qty 15, 44d supply, fill #4

## 2022-02-20 MED ORDER — NOVOLIN R FLEXPEN 100 UNIT/ML IJ SOPN: PEN_INJECTOR | INTRAMUSCULAR | 6 refills | Status: DC

## 2022-02-20 MED ORDER — INSULIN REGULAR HUMAN 100 UNIT/ML IJ SOLN
INTRAMUSCULAR | 5 refills | Status: DC
  Filled 2022-02-20: qty 10, 33d supply, fill #0
  Filled 2022-04-02: qty 10, 30d supply, fill #0
  Filled 2022-05-09: qty 10, 30d supply, fill #1
  Filled 2022-06-07 (×3): qty 10, 30d supply, fill #2
  Filled 2022-07-05: qty 10, 30d supply, fill #3
  Filled 2022-08-18: qty 10, 30d supply, fill #4
  Filled 2022-10-31: qty 10, 30d supply, fill #5

## 2022-02-20 MED ORDER — INSULIN NPH (HUMAN) (ISOPHANE) 100 UNIT/ML ~~LOC~~ SUSP
SUBCUTANEOUS | 5 refills | Status: DC
  Filled 2022-02-20: qty 20, 58d supply, fill #0

## 2022-02-20 NOTE — Telephone Encounter (Signed)
Patient stated she has been taking furosemide (LASIX) 20 MG tablet and potassium for swelling but the side effect is she has been unable to sleep.  Patient stated she stopped taking the medication 4-5 days ago and has not had any swelling.  Patient would like to know if she should continue taking the Lasik.

## 2022-02-20 NOTE — Telephone Encounter (Signed)
I spoke with patient.  She reports she has to get up often during the night to urinate.  She stopped taking lasix a few days ago due to this.  Swelling has not increased. She is asking if she should continue lasix.  I advised patient that Richardson Dopp, PA and Dr Tamala Julian want her to take lasix daily.  She will resume and take early in the morning.

## 2022-02-21 ENCOUNTER — Other Ambulatory Visit (HOSPITAL_BASED_OUTPATIENT_CLINIC_OR_DEPARTMENT_OTHER): Payer: Self-pay

## 2022-02-23 ENCOUNTER — Other Ambulatory Visit: Payer: Self-pay

## 2022-02-23 DIAGNOSIS — D509 Iron deficiency anemia, unspecified: Secondary | ICD-10-CM

## 2022-02-26 ENCOUNTER — Inpatient Hospital Stay (HOSPITAL_BASED_OUTPATIENT_CLINIC_OR_DEPARTMENT_OTHER): Payer: Medicare Other | Admitting: Hematology

## 2022-02-26 ENCOUNTER — Inpatient Hospital Stay: Payer: Medicare Other | Attending: Hematology

## 2022-02-26 VITALS — BP 124/56 | HR 77 | Temp 97.6°F | Resp 15 | Wt 173.4 lb

## 2022-02-26 DIAGNOSIS — G479 Sleep disorder, unspecified: Secondary | ICD-10-CM | POA: Diagnosis not present

## 2022-02-26 DIAGNOSIS — D509 Iron deficiency anemia, unspecified: Secondary | ICD-10-CM

## 2022-02-26 DIAGNOSIS — D563 Thalassemia minor: Secondary | ICD-10-CM | POA: Diagnosis not present

## 2022-02-26 DIAGNOSIS — E119 Type 2 diabetes mellitus without complications: Secondary | ICD-10-CM | POA: Diagnosis not present

## 2022-02-26 DIAGNOSIS — M7989 Other specified soft tissue disorders: Secondary | ICD-10-CM | POA: Insufficient documentation

## 2022-02-26 DIAGNOSIS — Z79899 Other long term (current) drug therapy: Secondary | ICD-10-CM | POA: Insufficient documentation

## 2022-02-26 DIAGNOSIS — G20A1 Parkinson's disease without dyskinesia, without mention of fluctuations: Secondary | ICD-10-CM | POA: Diagnosis not present

## 2022-02-26 LAB — CBC WITH DIFFERENTIAL (CANCER CENTER ONLY)
Abs Immature Granulocytes: 0.02 10*3/uL (ref 0.00–0.07)
Basophils Absolute: 0.1 10*3/uL (ref 0.0–0.1)
Basophils Relative: 1 %
Eosinophils Absolute: 0.1 10*3/uL (ref 0.0–0.5)
Eosinophils Relative: 2 %
HCT: 36.9 % (ref 36.0–46.0)
Hemoglobin: 11.7 g/dL — ABNORMAL LOW (ref 12.0–15.0)
Immature Granulocytes: 0 %
Lymphocytes Relative: 21 %
Lymphs Abs: 1.6 10*3/uL (ref 0.7–4.0)
MCH: 20.2 pg — ABNORMAL LOW (ref 26.0–34.0)
MCHC: 31.7 g/dL (ref 30.0–36.0)
MCV: 63.8 fL — ABNORMAL LOW (ref 80.0–100.0)
Monocytes Absolute: 0.5 10*3/uL (ref 0.1–1.0)
Monocytes Relative: 7 %
Neutro Abs: 5.3 10*3/uL (ref 1.7–7.7)
Neutrophils Relative %: 69 %
Platelet Count: 159 10*3/uL (ref 150–400)
RBC: 5.78 MIL/uL — ABNORMAL HIGH (ref 3.87–5.11)
RDW: 16.1 % — ABNORMAL HIGH (ref 11.5–15.5)
WBC Count: 7.5 10*3/uL (ref 4.0–10.5)
nRBC: 0 % (ref 0.0–0.2)

## 2022-02-26 LAB — CMP (CANCER CENTER ONLY)
ALT: 32 U/L (ref 0–44)
AST: 27 U/L (ref 15–41)
Albumin: 4.5 g/dL (ref 3.5–5.0)
Alkaline Phosphatase: 86 U/L (ref 38–126)
Anion gap: 10 (ref 5–15)
BUN: 23 mg/dL (ref 8–23)
CO2: 24 mmol/L (ref 22–32)
Calcium: 11 mg/dL — ABNORMAL HIGH (ref 8.9–10.3)
Chloride: 103 mmol/L (ref 98–111)
Creatinine: 0.87 mg/dL (ref 0.44–1.00)
GFR, Estimated: >60 mL/min (ref >60)
Glucose, Bld: 137 mg/dL — ABNORMAL HIGH (ref 70–99)
Potassium: 4 mmol/L (ref 3.5–5.1)
Sodium: 137 mmol/L (ref 135–145)
Total Bilirubin: 1.2 mg/dL (ref 0.3–1.2)
Total Protein: 7.7 g/dL (ref 6.5–8.1)

## 2022-02-26 LAB — FERRITIN: Ferritin: 98 ng/mL (ref 11–307)

## 2022-02-26 LAB — IRON AND IRON BINDING CAPACITY (CC-WL,HP ONLY)
Iron: 151 ug/dL (ref 28–170)
Saturation Ratios: 43 % — ABNORMAL HIGH (ref 10.4–31.8)
TIBC: 356 ug/dL (ref 250–450)
UIBC: 205 ug/dL (ref 148–442)

## 2022-02-26 NOTE — Progress Notes (Signed)
HEMATOLOGY/ONCOLOGY CLINIC NOTE  Date of Service: 02/26/2022  Patient Care Team: [name], NP as PCP - General (Family Medicine) [name], MD as PCP - Cardiology (Cardiology) Madelin Rear, MD as Consulting Physician (Endocrinology) Garvin Fila, MD as Referring Physician (Neurology) Viona Gilmore, [facility] as Pharmacist (Pharmacist)  REFERRING PHYSICIAN: [name], NP  CHIEF COMPLAINTS/PURPOSE OF CONSULTATION:    Beta Thalassemia minor IDA  HISTORY OF PRESENTING ILLNESS:  Please see previous note for details on initial presentation  INTERVAL HISTORY: Breanna Webster is a 76 y.o. female here for evaluation and management of BetaThalassemia minor and IDA.   The patient was last seen by me on 09/06/2021 and she reported resting tremors and mild fatigue.   She reports she recently had blood work done around 2 weeks ago, which showed her slightly low platelets counts. She complains of persistent fatigue and low energy during today's visit. She reports her fatigue is worse than our last visit.   She regularly takes vitamin B-complex.   She states her thyroid is stable and denies being depressed. She reports she is now taking Lasix with potassium, which has been causing her sleeping problems. She reports her diabetes has been stable.  She denies of any change in her diet. She reports that she has been diagnosed with parkinson's syndrome and regularly sees Neurologist for it.   She reports her shoulder MRI next week and colonoscopy in few weeks.   She denies chest pain, back pain, and abdominal pain. She does complains of leg swelling.    MEDICAL HISTORY:  Past Medical History:  Diagnosis Date   Anxiety    Arthritis    "back" (04/22/2018)   Back pain    Blood transfusion without reported diagnosis    CAD (coronary artery disease)    a. 03/2018 s/p PCI/DES to the RCA (3.0x15 Onyx DES).   Carotid artery stenosis    Mild   Chest pain    Chronic  lower back pain    Cirrhosis (HCC)    Colon polyps    Diverticulitis    Diverticulosis    Esophageal thickening    seen on pre TAVR CT scan, also questionable cirrhosis. MRI recommended. Will refer to GI   Fatty liver    GERD (gastroesophageal reflux disease)    Grave's disease    History of colonic polyps 05/22/2017   History of hiatal hernia    Hypertension    Hypothyroidism    IBS (irritable bowel syndrome)    Osteopenia    Pulmonary nodules    seen on pre TAVR CT. likley benign. no follow up recommended if pt low risk.   S/P TAVR (transcatheter aortic valve replacement)    Severe aortic stenosis    Shortness of breath on exertion    Stroke (HCC)    Thalassemia minor    Thyroid disease    Type II diabetes mellitus ([place])      SURGICAL HISTORY: Past Surgical History:  Procedure Laterality Date   68 HOUR [place] STUDY N/A 03/03/2018   Procedure: 24 HOUR [place];  Surgeon: Mauri Pole, MD;  Location: [facility];  Service: Endoscopy;  Laterality: N/A;   AORTIC VALVE REPLACEMENT  06/2018   COLONOSCOPY     COLONOSCOPY W/ BIOPSIES AND POLYPECTOMY     CORONARY ANGIOGRAPHY Right 04/21/2018   Procedure: CORONARY ANGIOGRAPHY (CATH LAB);  Surgeon: [name], MD;  Location: [facility];  Service: Cardiovascular;  Laterality: Right;  CORONARY STENT INTERVENTION N/A 04/22/2018   Procedure: CORONARY STENT INTERVENTION;  Surgeon: [name], MD;  Location: [place];  Service: Cardiovascular;  Laterality: N/A;   DILATION AND CURETTAGE OF UTERUS     ESOPHAGEAL MANOMETRY N/A 03/03/2018   Procedure: ESOPHAGEAL MANOMETRY (EM);  Surgeon: Mauri Pole, MD;  Location: [facility];  Service: Endoscopy;  Laterality: N/A;   GASTRIC FUNDOPLICATION     HERNIA REPAIR     HYSTEROSCOPY     fibroids   LAPAROSCOPIC CHOLECYSTECTOMY     LAPAROSCOPY     fibroids   NISSEN FUNDOPLICATION  1610R   POLYPECTOMY     RIGHT/LEFT HEART CATH AND CORONARY ANGIOGRAPHY N/A  02/20/2018   Procedure: RIGHT/LEFT HEART CATH AND CORONARY ANGIOGRAPHY;  Surgeon: [name], MD;  Location: [facility];  Service: Cardiovascular;  Laterality: N/A;   TEE WITHOUT CARDIOVERSION N/A 07/08/2018   Procedure: TRANSESOPHAGEAL ECHOCARDIOGRAM (TEE);  Surgeon: Burnell Blanks, MD;  Location: [facility];  Service: Open Heart Surgery;  Laterality: N/A;   TEE WITHOUT CARDIOVERSION  10/07/2018   TEE WITHOUT CARDIOVERSION N/A 10/07/2018   Procedure: TRANSESOPHAGEAL ECHOCARDIOGRAM (TEE);  Surgeon: Jerline Pain, MD;  Location: [facility] ENDOSCOPY;  Service: Cardiovascular;  Laterality: N/A;   TONSILLECTOMY     TRANSCATHETER AORTIC VALVE REPLACEMENT, TRANSFEMORAL N/A 07/08/2018   Procedure: TRANSCATHETER AORTIC VALVE REPLACEMENT, TRANSFEMORAL;  Surgeon: Burnell Blanks, MD;  Location: [facility] [facility];  Service: Open Heart Surgery;  Laterality: N/A;     SOCIAL HISTORY: Social History   Socioeconomic History   Marital status: Married    Spouse name: Not on file   Number of children: 2   Years of education: Not on file   Highest education level: Not on file  Occupational History   Occupation: Tree surgeon of the [employer]  Tobacco Use   Smoking status: Never   Smokeless tobacco: Never  Vaping Use   Vaping Use: Never used  Substance and Sexual Activity   Alcohol use: No    Alcohol/week: 0.0 standard drinks of alcohol   Drug use: Never   Sexual activity: Not Currently  Other Topics Concern   Not on file  Social History Narrative   Lives with husband in a one story home.     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   Education: college.   Social Determinants of Health   Financial Resource Strain: Medium Risk (11/13/2021)   Overall Financial Resource Strain (CARDIA)    Difficulty of Paying Living Expenses: Somewhat hard  Food Insecurity: No Food Insecurity (04/03/2021)   Hunger Vital  Sign    Worried About Running Out of Food in the Last Year: Never true    Ran Out of Food in the Last Year: Never true  Transportation Needs: No Transportation Needs (04/03/2021)   PRAPARE - Hydrologist (Medical): No    Lack of Transportation (Non-Medical): No  Physical Activity: Inactive (04/03/2021)   Exercise Vital Sign    Days of Exercise per Week: 0 days    Minutes of Exercise per Session: 0 min  Stress: No Stress Concern Present (04/03/2021)   [place]    Feeling of Stress : Not at all  Social Connections: [place] (04/03/2021)   Social Connection and Isolation Panel [NHANES]    Frequency of Communication with Friends and Family: More than  three times a week    Frequency of Social Gatherings with Friends and Family: More than three times a week    Attends Religious Services: More than 4 times per year    Active Member of [employer] or Organizations: Yes    Attends Music therapist: More than 4 times per year    Marital Status: Married  Human resources officer Violence: Not At Risk (04/03/2021)   Humiliation, Afraid, Rape, and Kick questionnaire    Fear of Current or Ex-Partner: No    Emotionally Abused: No    Physically Abused: No    Sexually Abused: No     FAMILY HISTORY: Family History  Adopted: Yes  Problem Relation Age of Onset   Healthy Son        x 2   Headache Other        Cluster headaches   Heart failure Mother    Colon cancer Neg Hx    Pancreatic cancer Neg Hx    Rectal cancer Neg Hx    Stomach cancer Neg Hx    ALLERGIES:   is allergic to statins.   MEDICATIONS:  Current Outpatient Medications  Medication Sig Dispense Refill   apixaban (ELIQUIS) 5 MG TABS tablet Take 1 tablet (5 mg total) by mouth 2 (two) times daily. 180 tablet 3   b complex vitamins capsule Take 1 capsule by mouth daily.     BD PEN NEEDLE NANO 2ND GEN 32G X 4 MM MISC       cholecalciferol (VITAMIN D) 25 MCG (1000 UNIT) tablet Take 1,000 Units by mouth daily.     dicyclomine (BENTYL) 10 MG capsule Take 1 capsule (10 mg total) by mouth every 8 (eight) hours as needed for spasms. (Patient not taking: Reported on 02/09/2022) 30 capsule 1   diltiazem (CARDIZEM CD) 120 MG 24 hr capsule Take 1 capsule (120 mg total) by mouth daily. 90 capsule 3   escitalopram (LEXAPRO) 20 MG tablet TAKE ONE TABLET BY MOUTH DAILY 90 tablet 1   Evolocumab (REPATHA SURECLICK) 397 MG/ML SOAJ Inject 1 dose into the skin every 14 (fourteen) days. 2 mL 11   furosemide (LASIX) 20 MG tablet Take 2 tablets (4m) by mouth daily on 9/13, 9/14, and 9/15. Then take 1 tablet (276m by mouth daily. (Patient taking differently: Take 20 mg by mouth every other day. Take 2 tablets (4060mby mouth daily on 9/13, 9/14, and 9/15. Then take 1 tablet (33m32my mouth daily.) 90 tablet 3   glucose blood (ONETOUCH VERIO) test strip USE TO CHECK BLOOD SUGAR THREE TIMES DAILY 300 strip 4   insulin NPH Human (HUMULIN N) 100 UNIT/ML injection Inject 20 units under the skin in the morning and 14 units in evening 20 mL 5   Insulin NPH, Human,, Isophane, (HUMULIN N KWIKPEN) 100 UNIT/ML Kiwkpen Inject 20 units into the skin every morning and 14 units every evening. 15 mL 6   Insulin Pen Needle (UNIFINE PENTIPS) 32G X 4 MM MISC Inject 4 times subcutaneously 400 each 2   insulin regular (HUMULIN R) 100 units/mL injection Inject 10 Units into the skin 3 (three) times daily before meals. 10 mL 5   insulin regular (HUMULIN R) 100 units/mL injection Inject 10 Units total into the skin 3 (three) times daily before meals. 10 mL 5   Insulin Regular Human (NOVOLIN R FLEXPEN RELION) 100 UNIT/ML KwikPen Inject 5 to 10 units under the skin three times daily. 15 mL 6   Insulin Syringe-Needle  U-100 (ULTICARE INSULIN SYRINGE) 31G X 5/16" 1 ML MISC Inject 4 times daily subcutaneously 100 each 1   Lancets (ONETOUCH DELICA PLUS [id number]) MISC  Use twice daily 200 each 1   levothyroxine (SYNTHROID) 137 MCG tablet Take 137 mcg by mouth daily before breakfast. (Patient not taking: Reported on 02/09/2022)     levothyroxine (SYNTHROID) 137 MCG tablet Take 1 tablet by mouth every morning on an empty stomach  BRAND NAME ONLY 90 tablet 1   loperamide (IMODIUM A-D) 2 MG tablet Take 1 tablet (2 mg total) by mouth as needed for diarrhea or loose stools. 30 tablet 0   losartan (COZAAR) 50 MG tablet TAKE ONE TABLET BY MOUTH DAILY 90 tablet 3   meclizine (ANTIVERT) 25 MG tablet Take 1 tablet (25 mg total) by mouth 3 (three) times daily as needed for dizziness. 30 tablet 0   metFORMIN (GLUCOPHAGE-XR) 500 MG 24 hr tablet Take 1 tablet by mouth 2 times daily 180 tablet 2   Multiple Vitamin (MULITIVITAMIN WITH MINERALS) TABS Take 1 tablet by mouth daily.     nitroGLYCERIN (NITROSTAT) 0.4 MG SL tablet Place 1 tablet (0.4 mg total) under the tongue every 5 (five) minutes as needed for chest pain. 25 tablet 4   NOVOLIN N 100 UNIT/ML injection Inject 28 Units into the skin daily before breakfast. Taking 20 units every morning and 8 units every evening     omeprazole (PRILOSEC) 20 MG capsule Take 1 capsule (20 mg total) by mouth 2 (two) times daily before a meal. 60 capsule 3   ondansetron (ZOFRAN) 4 MG tablet Take 1 tablet (4 mg total) by mouth every 8 (eight) hours as needed for nausea or vomiting. 20 tablet 0   potassium chloride SA (KLOR-CON M) 20 MEQ tablet Take 1 tablet (20 mEq total) by mouth daily as needed. Take with Furosemide (Lasix). 90 tablet 3   rOPINIRole (REQUIP) 0.25 MG tablet Take 1 tablet (0.25 mg total) by mouth 3 (three) times daily. (Patient not taking: Reported on 02/09/2022) 90 tablet 5   No current facility-administered medications for this visit.     REVIEW OF SYSTEMS:   10 Point review of Systems was done is negative except as noted above.  PHYSICAL EXAMINATION: Vitals:   02/26/22 1053  BP: (!) 124/56  Pulse: 77  Resp: 15   Temp: 97.6 F (36.4 C)  SpO2: 97%    Wt Readings from Last 3 Encounters:  02/26/22 173 lb 6.4 oz (78.7 kg)  02/09/22 178 lb (80.7 kg)  01/11/22 178 lb (80.7 kg)   Body mass index is 28.86 kg/m.  NAD GENERAL:alert, in no acute distress and comfortable SKIN: no acute rashes, no significant lesions EYES: conjunctiva are pink and non-injected, sclera anicteric NECK: supple, no JVD LYMPH:  no palpable lymphadenopathy in the cervical, axillary or inguinal regions LUNGS: clear to auscultation b/l with normal respiratory effort HEART: regular rate & rhythm ABDOMEN:  normoactive bowel sounds , non tender, not distended. Extremity: no pedal edema PSYCH: alert & oriented x 3 with fluent speech NEURO: no focal motor/sensory deficits  Exam performed in chair.  LABORATORY DATA:  I have reviewed the data as listed     Latest Ref Rng & Units 02/26/2022   10:37 AM 02/06/2022   12:17 PM 09/22/2021   10:56 AM  CBC  WBC 4.0 - 10.5 K/uL 7.5  7.3  6.8   Hemoglobin 12.0 - 15.0 g/dL 11.7  11.0  10.9   Hematocrit 36.0 - 46.0 %  36.9  35.3  34.9   Platelets 150 - 400 K/uL 159  202.0  173.0    . CBC    Component Value Date/Time   WBC 7.5 02/26/2022 1037   WBC 7.3 02/06/2022 1217   RBC 5.78 (H) 02/26/2022 1037   HGB 11.7 (L) 02/26/2022 1037   HGB 11.1 08/03/2020 1100   HCT 36.9 02/26/2022 1037   HCT 34.5 08/03/2020 1100   PLT 159 02/26/2022 1037   PLT 201 08/03/2020 1100   MCV 63.8 (L) 02/26/2022 1037   MCV 65 (L) 08/03/2020 1100   MCH 20.2 (L) 02/26/2022 1037   MCHC 31.7 02/26/2022 1037   RDW 16.1 (H) 02/26/2022 1037   RDW 15.6 (H) 08/03/2020 1100   LYMPHSABS 1.6 02/26/2022 1037   LYMPHSABS 1.4 08/03/2020 1100   MONOABS 0.5 02/26/2022 1037   EOSABS 0.1 02/26/2022 1037   EOSABS 0.1 08/03/2020 1100   BASOSABS 0.1 02/26/2022 1037   BASOSABS 0.1 08/03/2020 1100   .    Latest Ref Rng & Units 02/09/2022   11:37 AM 01/09/2022    1:55 PM 01/01/2022    4:26 PM  CMP  Glucose 70 - 99  mg/dL 165  189  118   BUN 8 - 27 mg/dL 18  23  17    Creatinine 0.57 - 1.00 mg/dL 0.85  0.90  0.81   Sodium 134 - 144 mmol/L 137  138  140   Potassium 3.5 - 5.2 mmol/L 4.0  4.2  4.0   Chloride 96 - 106 mmol/L 101  100  102   CO2 20 - 29 mmol/L 22  18  22    Calcium 8.7 - 10.3 mg/dL 10.6  10.3  10.2    . Lab Results  Component Value Date   IRON 129 02/06/2022   TIBC 320 02/06/2022   IRONPCTSAT 40 02/06/2022   (Iron and TIBC)  Lab Results  Component Value Date   FERRITIN 78 02/06/2022     RADIOGRAPHIC STUDIES: I have personally reviewed the radiological images as listed and agreed with the findings in the report.  ASSESSMENT & PLAN:   GRACLYN LAWTHER is a 76 y.o. female with:  Moderate Microcytic Anemia. H/o Beta thalassemia minor with baseline hgb likely in the 10-11 range 3.   Iron deficiency   PLAN: -Lab results today 09/07/2020 discussed in detail. CBC stable. Hgb at baseline @ 11.7K. CMP stable. Ferritin of 78 and Iron sat ratio of 40 % on 02/06/2022. -No indication for IV Iron at this time. -Continue to take Vitamin B-Complex daily. -Recommend pt continue f/u with Neurology for tremor management. -Will see back in 12 months with labs.   FOLLOW UP: RTC with Dr Irene Limbo with labs in 12 months  The total time spent in the appointment was 20 minutes* .  All of the patient's questions were answered with apparent satisfaction. The patient knows to call the clinic with any problems, questions or concerns.   Zettie Cooley, am acting as a scribe for Sullivan Lone, MD.  Sullivan Lone MD [place] [facility] Hematology/Oncology Physician [facility]  .*Total Encounter Time as defined by the Centers for Medicare and Medicaid Services includes, in addition to the face-to-face time of a patient visit (documented in the note above) non-face-to-face time: obtaining and reviewing outside history, ordering and reviewing medications, tests or procedures, care  coordination (communications with other health care professionals or caregivers) and documentation in the medical record.

## 2022-02-27 ENCOUNTER — Other Ambulatory Visit (HOSPITAL_BASED_OUTPATIENT_CLINIC_OR_DEPARTMENT_OTHER): Payer: Self-pay

## 2022-02-27 ENCOUNTER — Ambulatory Visit (AMBULATORY_SURGERY_CENTER): Payer: Self-pay | Admitting: *Deleted

## 2022-02-27 VITALS — Ht 65.0 in | Wt 173.6 lb

## 2022-02-27 DIAGNOSIS — Z8719 Personal history of other diseases of the digestive system: Secondary | ICD-10-CM

## 2022-02-27 DIAGNOSIS — Z86010 Personal history of colonic polyps: Secondary | ICD-10-CM

## 2022-02-27 DIAGNOSIS — R194 Change in bowel habit: Secondary | ICD-10-CM

## 2022-02-27 DIAGNOSIS — R109 Unspecified abdominal pain: Secondary | ICD-10-CM

## 2022-02-27 MED ORDER — PEG 3350-KCL-NABCB-NACL-NASULF 236 G PO SOLR
4000.0000 mL | Freq: Once | ORAL | 0 refills | Status: AC
  Filled 2022-02-27: qty 4000, 1d supply, fill #0

## 2022-02-27 NOTE — Progress Notes (Signed)
No egg or soy allergy known to patient  No issues known to pt with past sedation with any surgeries or procedures Patient denies ever being told they had issues or difficulty with intubation  No FH of Malignant Hyperthermia Pt is not on diet pills Pt is not on  home 02  Pt is  on blood thinners ELIQUIS Pt denies issues with constipation  No A fib or A flutter Have any cardiac testing pending--NO Pt instructed to use Singlecare.com or GoodRx for a price reduction on prep    Patient's chart reviewed by Osvaldo Angst CNRA prior to previsit and patient appropriate for the [place].  Previsit completed and red dot placed by patient's name on their procedure day (on provider's schedule).

## 2022-02-28 DIAGNOSIS — M19012 Primary osteoarthritis, left shoulder: Secondary | ICD-10-CM | POA: Diagnosis not present

## 2022-02-28 DIAGNOSIS — M25412 Effusion, left shoulder: Secondary | ICD-10-CM | POA: Diagnosis not present

## 2022-02-28 DIAGNOSIS — M948X1 Other specified disorders of cartilage, shoulder: Secondary | ICD-10-CM | POA: Diagnosis not present

## 2022-02-28 DIAGNOSIS — M75112 Incomplete rotator cuff tear or rupture of left shoulder, not specified as traumatic: Secondary | ICD-10-CM | POA: Diagnosis not present

## 2022-02-28 DIAGNOSIS — M24112 Other articular cartilage disorders, left shoulder: Secondary | ICD-10-CM | POA: Diagnosis not present

## 2022-03-01 ENCOUNTER — Telehealth: Payer: Self-pay | Admitting: *Deleted

## 2022-03-01 NOTE — Patient Instructions (Signed)
Visit Information  Thank you for taking time to visit with me today. Please don't hesitate to contact me if I can be of assistance to you.   Following are the goals we discussed today:   Goals Addressed               This Visit's Progress     COMPLETED: no needs (pt-stated)        Care Coordination Interventions: Reviewed medications with patient and discussed adherence with all medications Reviewed scheduled/upcoming provider appointments including pending appointments with sufficient transportation Assessed social determinant of health barriers         Please call the care guide team at [phone] if you need to cancel or reschedule your appointment.   If you are experiencing a Mental Health or [place] or need someone to talk to, please call the Suicide and Crisis Lifeline: 988  Patient verbalizes understanding of instructions and care plan provided today and agrees to view in [place]. Active MyChart status and patient understanding of how to access instructions and care plan via MyChart confirmed with patient.     No further follow up required: No needs at this time  [name], RN Care Management Coordinator [place] Office [phone]

## 2022-03-01 NOTE — Patient Outreach (Signed)
  Care Coordination   Initial Visit Note   03/01/2022 Name: Breanna Webster MRN: [id number] DOB: 24-May-1945  Breanna Webster is a 76 y.o. year old female who sees Nafziger, Tommi Rumps, NP for primary care. I  spoke with spouse Breanna Webster today DPR  What matters to the patients health and wellness today?  No needs    Goals Addressed               This Visit's Progress     COMPLETED: no needs (pt-stated)        Care Coordination Interventions: Reviewed medications with patient and discussed adherence with all medications Reviewed scheduled/upcoming provider appointments including pending appointments with sufficient transportation Assessed social determinant of health barriers         SDOH assessments and interventions completed:  Yes  SDOH Interventions Today    Flowsheet Row Most Recent Value  SDOH Interventions   Food Insecurity Interventions Intervention Not Indicated  Housing Interventions Intervention Not Indicated  Transportation Interventions Intervention Not Indicated  Utilities Interventions Intervention Not Indicated        Care Coordination Interventions Activated:  Yes  Care Coordination Interventions:  Yes, provided   Follow up plan: No further intervention required.   Encounter Outcome:  Pt. Visit Completed   [name], RN Care Management Coordinator [place] Office [phone]

## 2022-03-02 DIAGNOSIS — R350 Frequency of micturition: Secondary | ICD-10-CM | POA: Diagnosis not present

## 2022-03-02 DIAGNOSIS — N3941 Urge incontinence: Secondary | ICD-10-CM | POA: Diagnosis not present

## 2022-03-05 ENCOUNTER — Encounter: Payer: Self-pay | Admitting: Hematology

## 2022-03-06 ENCOUNTER — Telehealth: Payer: Self-pay | Admitting: Gastroenterology

## 2022-03-06 NOTE — Telephone Encounter (Signed)
Patient called requested to speak with a nurse regarding her Insulin and prep instructions.

## 2022-03-06 NOTE — Telephone Encounter (Signed)
Placed call to pt. And clarified the way pt.should be taking insulin,encouraged her to check blood sugar though out the entire process verbalized understanding ,all questions answered.instructed pt. To call back with any other questions or concerns.

## 2022-03-07 ENCOUNTER — Encounter: Payer: Self-pay | Admitting: Hematology

## 2022-03-07 ENCOUNTER — Telehealth: Payer: Medicare Other

## 2022-03-07 ENCOUNTER — Telehealth: Payer: Self-pay | Admitting: Pharmacist

## 2022-03-07 ENCOUNTER — Ambulatory Visit (AMBULATORY_SURGERY_CENTER): Payer: Medicare Other | Admitting: Gastroenterology

## 2022-03-07 ENCOUNTER — Encounter: Payer: Self-pay | Admitting: Gastroenterology

## 2022-03-07 ENCOUNTER — Other Ambulatory Visit (HOSPITAL_BASED_OUTPATIENT_CLINIC_OR_DEPARTMENT_OTHER): Payer: Self-pay

## 2022-03-07 VITALS — BP 152/60 | HR 70 | Temp 96.6°F | Resp 14 | Ht 65.0 in | Wt 173.6 lb

## 2022-03-07 DIAGNOSIS — K7469 Other cirrhosis of liver: Secondary | ICD-10-CM

## 2022-03-07 DIAGNOSIS — K5730 Diverticulosis of large intestine without perforation or abscess without bleeding: Secondary | ICD-10-CM | POA: Diagnosis not present

## 2022-03-07 DIAGNOSIS — K649 Unspecified hemorrhoids: Secondary | ICD-10-CM | POA: Diagnosis not present

## 2022-03-07 DIAGNOSIS — F419 Anxiety disorder, unspecified: Secondary | ICD-10-CM | POA: Diagnosis not present

## 2022-03-07 DIAGNOSIS — I2510 Atherosclerotic heart disease of native coronary artery without angina pectoris: Secondary | ICD-10-CM | POA: Diagnosis not present

## 2022-03-07 DIAGNOSIS — D124 Benign neoplasm of descending colon: Secondary | ICD-10-CM

## 2022-03-07 DIAGNOSIS — D123 Benign neoplasm of transverse colon: Secondary | ICD-10-CM | POA: Diagnosis not present

## 2022-03-07 DIAGNOSIS — R194 Change in bowel habit: Secondary | ICD-10-CM | POA: Diagnosis not present

## 2022-03-07 DIAGNOSIS — D120 Benign neoplasm of cecum: Secondary | ICD-10-CM | POA: Diagnosis not present

## 2022-03-07 DIAGNOSIS — K7460 Unspecified cirrhosis of liver: Secondary | ICD-10-CM | POA: Diagnosis not present

## 2022-03-07 DIAGNOSIS — Z86010 Personal history of colonic polyps: Secondary | ICD-10-CM

## 2022-03-07 DIAGNOSIS — K2289 Other specified disease of esophagus: Secondary | ICD-10-CM | POA: Diagnosis not present

## 2022-03-07 DIAGNOSIS — R197 Diarrhea, unspecified: Secondary | ICD-10-CM

## 2022-03-07 DIAGNOSIS — Z8719 Personal history of other diseases of the digestive system: Secondary | ICD-10-CM | POA: Diagnosis not present

## 2022-03-07 DIAGNOSIS — G20A1 Parkinson's disease without dyskinesia, without mention of fluctuations: Secondary | ICD-10-CM | POA: Diagnosis not present

## 2022-03-07 DIAGNOSIS — I509 Heart failure, unspecified: Secondary | ICD-10-CM | POA: Diagnosis not present

## 2022-03-07 MED ORDER — SODIUM CHLORIDE 0.9 % IV SOLN: 500.0000 mL | Freq: Once | INTRAVENOUS | Status: DC

## 2022-03-07 NOTE — Op Note (Signed)
[facility] Patient Name: Breanna Webster Procedure Date: 03/07/2022 1:47 PM MRN: [id number] Endoscopist: Remo Lipps P. Havery Moros , MD, [id number] Age: 76 Referring MD:  Date of Birth: 10-21-1945 Gender: Female Account #: [id number] Procedure:                Upper GI endoscopy Indications:              Cirrhosis rule out esophageal varices, history of                            short segment Barrett's Medicines:                Monitored Anesthesia Care Procedure:                Pre-Anesthesia Assessment:                           - Prior to the procedure, a History and Physical                            was performed, and patient medications and                            allergies were reviewed. The patient's tolerance of                            previous anesthesia was also reviewed. The risks                            and benefits of the procedure and the sedation                            options and risks were discussed with the patient.                            All questions were answered, and informed consent                            was obtained. Prior Anticoagulants: The patient has                            taken Eliquis (apixaban), last dose was 1 day prior                            to procedure. ASA Grade Assessment: III - A patient                            with severe systemic disease. After reviewing the                            risks and benefits, the patient was deemed in                            satisfactory condition to undergo the procedure.  After obtaining informed consent, the endoscope was                            passed under direct vision. Throughout the                            procedure, the patient's blood pressure, pulse, and                            oxygen saturations were monitored continuously. The                            GIF [id number] #[id number] was introduced through the                             mouth, and advanced to the second part of duodenum.                            The upper GI endoscopy was accomplished without                            difficulty. The patient tolerated the procedure                            well. Scope In: Scope Out: Findings:                 Esophagogastric landmarks were identified: the                            Z-line was found at 36 cm from the incisors.                           The Z-line was irregular with a short tongue of                            salmon colored mucosa. Biopsies were taken with a                            cold forceps for histology.                           The exam of the esophagus was otherwise normal. No                            varices.                           Evidence of a prior Nissen fundoplication was found                            in the cardia. This was characterized by healthy                            appearing mucosa.  The exam of the stomach was otherwise normal. No                            varices.                           The examined duodenum was normal. Complications:            No immediate complications. Estimated blood loss:                            Minimal. Estimated Blood Loss:     Estimated blood loss was minimal. Impression:               - Esophagogastric landmarks identified.                           - Z-line irregular. Biopsied.                           - Normal esophagus otherwise - no varices                           - A Nissen fundoplication was found, characterized                            by healthy appearing mucosa.                           - Normal stomach otherwise - no varices                           - Normal examined duodenum. Recommendation:           - Patient has a contact number available for                            emergencies. The signs and symptoms of potential                            delayed complications were discussed  with the                            patient. Return to normal activities tomorrow.                            Written discharge instructions were provided to the                            patient.                           - Resume previous diet.                           - Continue present medications.                           -  Resume Eliquis tomorrow per colonoscopy note                           - Await pathology results. Remo Lipps P. Norman Piacentini, MD 03/07/2022 3:10:18 PM This report has been signed electronically.

## 2022-03-07 NOTE — Patient Instructions (Addendum)
6 polyps removed and sent to pathology.  Await results for final recommendations.  Handouts on findings given to patient.  (Polyps, diverticulosis, hemorrhoids) - Resume previous diet. - Continue present medications. - Await pathology results. Anticipate no further surveillance exams due to age given no high risk lesions on this exam - Resume Eliquis tomorrow.   YOU HAD AN ENDOSCOPIC PROCEDURE TODAY AT [facility]:   Refer to the procedure report that was given to you for any specific questions about what was found during the examination.  If the procedure report does not answer your questions, please call your gastroenterologist to clarify.  If you requested that your care partner not be given the details of your procedure findings, then the procedure report has been included in a sealed envelope for you to review at your convenience later.  YOU SHOULD EXPECT: Some feelings of bloating in the abdomen. Passage of more gas than usual.  Walking can help get rid of the air that was put into your GI tract during the procedure and reduce the bloating. If you had a lower endoscopy (such as a colonoscopy or flexible sigmoidoscopy) you may notice spotting of blood in your stool or on the toilet paper. If you underwent a bowel prep for your procedure, you may not have a normal bowel movement for a few days.  Please Note:  You might notice some irritation and congestion in your nose or some drainage.  This is from the oxygen used during your procedure.  There is no need for concern and it should clear up in a day or so.  SYMPTOMS TO REPORT IMMEDIATELY:  Following lower endoscopy (colonoscopy or flexible sigmoidoscopy):  Excessive amounts of blood in the stool  Significant tenderness or worsening of abdominal pains  Swelling of the abdomen that is new, acute  Fever of 100F or higher  Following upper endoscopy (EGD)  Vomiting of blood or coffee ground material  New chest pain or pain  under the shoulder blades  Painful or persistently difficult swallowing  New shortness of breath  Fever of 100F or higher  Black, tarry-looking stools  For urgent or emergent issues, a gastroenterologist can be reached at any hour by calling [phone]. Do not use MyChart messaging for urgent concerns.    DIET:  We do recommend a small meal at first, but then you may proceed to your regular diet.  Drink plenty of fluids but you should avoid alcoholic beverages for 24 hours.  ACTIVITY:  You should plan to take it easy for the rest of today and you should NOT DRIVE or use heavy machinery until tomorrow (because of the sedation medicines used during the test).    FOLLOW UP: Our staff will call the number listed on your records the next business day following your procedure.  We will call around 7:15- 8:00 am to check on you and address any questions or concerns that you may have regarding the information given to you following your procedure. If we do not reach you, we will leave a message.     If any biopsies were taken you will be contacted by phone or by letter within the next 1-3 weeks.  Please call us at [phone] if you have not heard about the biopsies in 3 weeks.    SIGNATURES/CONFIDENTIALITY: You and/or your care partner have signed paperwork which will be entered into your electronic medical record.  These signatures attest to the fact that that the information above  on your After Visit Summary has been reviewed and is understood.  Full responsibility of the confidentiality of this discharge information lies with you and/or your care-partner.

## 2022-03-07 NOTE — Chronic Care Management (AMB) (Signed)
Chronic Care Management Pharmacy Assistant   Name: Breanna Webster  MRN: [id number] DOB: 1945-12-17  03/07/22 APPOINTMENT REMINDER  Patient was reminded to have all medications, supplements and any blood glucose and blood pressure readings available for review with [name], Pharm. D, for telephone visit on 03/07/22 at 1:15.    Care Gaps: Foot Exam - Overdue HGB A1C - Overdue COVID Booster - Overdue Eye Exam - Overdue Diabetic Urine - Overdue Flu Vaccine - Overdue Colonoscopy - Overdue  Star Rating Drug: Losartan 50 mg - last filled 02/10/2022 90 DS at [facility] Metformin 500 mg - last filled 10/212023 90 DS at [facility]     Medications: Outpatient Encounter Medications as of 03/07/2022  Medication Sig   apixaban (ELIQUIS) 5 MG TABS tablet Take 1 tablet (5 mg total) by mouth 2 (two) times daily.   b complex vitamins capsule Take 1 capsule by mouth daily.   BD PEN NEEDLE NANO 2ND GEN 32G X 4 MM MISC    cholecalciferol (VITAMIN D) 25 MCG (1000 UNIT) tablet Take 1,000 Units by mouth daily.   dicyclomine (BENTYL) 10 MG capsule Take 1 capsule (10 mg total) by mouth every 8 (eight) hours as needed for spasms. (Patient not taking: Reported on 02/09/2022)   diltiazem (CARDIZEM CD) 120 MG 24 hr capsule Take 1 capsule (120 mg total) by mouth daily.   escitalopram (LEXAPRO) 20 MG tablet TAKE ONE TABLET BY MOUTH DAILY   Evolocumab (REPATHA SURECLICK) 389 MG/ML SOAJ Inject 1 dose into the skin every 14 (fourteen) days.   furosemide (LASIX) 20 MG tablet Take 2 tablets (87m) by mouth daily on 9/13, 9/14, and 9/15. Then take 1 tablet (215m by mouth daily. (Patient taking differently: Take 20 mg by mouth every other day. Take 2 tablets (4078mby mouth daily on 9/13, 9/14, and 9/15. Then take 1 tablet (87m38my mouth daily.)   glucose blood (ONETOUCH VERIO) test strip USE TO CHECK BLOOD SUGAR THREE TIMES DAILY   insulin NPH Human (HUMULIN N) 100 UNIT/ML injection Inject 20  units under the skin in the morning and 14 units in evening   Insulin NPH, Human,, Isophane, (HUMULIN N KWIKPEN) 100 UNIT/ML Kiwkpen Inject 20 units into the skin every morning and 14 units every evening.   Insulin Pen Needle (UNIFINE PENTIPS) 32G X 4 MM MISC Inject 4 times subcutaneously   insulin regular (HUMULIN R) 100 units/mL injection Inject 10 Units into the skin 3 (three) times daily before meals.   insulin regular (HUMULIN R) 100 units/mL injection Inject 10 Units total into the skin 3 (three) times daily before meals.   Insulin Regular Human (NOVOLIN R FLEXPEN RELION) 100 UNIT/ML KwikPen Inject 5 to 10 units under the skin three times daily. (Patient not taking: Reported on 02/27/2022)   Insulin Syringe-Needle U-100 (ULTICARE INSULIN SYRINGE) 31G X 5/16" 1 ML MISC Inject 4 times daily subcutaneously   Lancets (ONETOUCH DELICA PLUS [id number] Use twice daily   levothyroxine (SYNTHROID) 137 MCG tablet Take 137 mcg by mouth daily before breakfast.   levothyroxine (SYNTHROID) 137 MCG tablet Take 1 tablet by mouth every morning on an empty stomach  BRAND NAME ONLY   loperamide (IMODIUM A-D) 2 MG tablet Take 1 tablet (2 mg total) by mouth as needed for diarrhea or loose stools.   losartan (COZAAR) 50 MG tablet TAKE ONE TABLET BY MOUTH DAILY   meclizine (ANTIVERT) 25 MG tablet Take 1 tablet (25 mg total) by mouth 3 (  three) times daily as needed for dizziness.   metFORMIN (GLUCOPHAGE-XR) 500 MG 24 hr tablet Take 1 tablet by mouth 2 times daily   Multiple Vitamin (MULITIVITAMIN WITH MINERALS) TABS Take 1 tablet by mouth daily.   nitroGLYCERIN (NITROSTAT) 0.4 MG SL tablet Place 1 tablet (0.4 mg total) under the tongue every 5 (five) minutes as needed for chest pain. (Patient not taking: Reported on 02/27/2022)   NOVOLIN N 100 UNIT/ML injection Inject 28 Units into the skin daily before breakfast. Taking 20 units every morning and 8 units every evening (Patient not taking: Reported on 02/27/2022)    omeprazole (PRILOSEC) 20 MG capsule Take 1 capsule (20 mg total) by mouth 2 (two) times daily before a meal. (Patient taking differently: Take 20 mg by mouth daily.)   ondansetron (ZOFRAN) 4 MG tablet Take 1 tablet (4 mg total) by mouth every 8 (eight) hours as needed for nausea or vomiting. (Patient not taking: Reported on 02/27/2022)   potassium chloride SA (KLOR-CON M) 20 MEQ tablet Take 1 tablet (20 mEq total) by mouth daily as needed. Take with Furosemide (Lasix).   rOPINIRole (REQUIP) 0.25 MG tablet Take 1 tablet (0.25 mg total) by mouth 3 (three) times daily. (Patient not taking: Reported on 02/09/2022)   No facility-administered encounter medications on file as of 03/07/2022.       [place] Clinical Pharmacist Assistant [phone]

## 2022-03-07 NOTE — Telephone Encounter (Signed)
  Chronic Care Management   Outreach Note  03/07/2022 Name: Breanna Webster MRN: [id number] DOB: 1946-01-09  Referred by: [name], NP  Patient had a phone appointment scheduled with clinical pharmacist today.  An unsuccessful telephone outreach was attempted today. The patient was referred to the pharmacist for assistance with care management and care coordination.   If possible, a message was left to return call to: [phone] or to [facility]: [place], PharmD, [place] Pharmacist [place] [facility] [place]

## 2022-03-07 NOTE — Op Note (Signed)
[facility] Patient Name: Breanna Webster Procedure Date: 03/07/2022 1:39 PM MRN: [id number] Endoscopist: Remo Lipps P. Havery Moros , MD, [id number] Age: 76 Referring MD:  Date of Birth: 1945-07-10 Gender: Female Account #: [id number] Procedure:                Colonoscopy Indications:              High risk colon cancer surveillance: Personal                            history of colonic polyps - numerous polyps removed                            on the last 2 exams (25 polyps), last exam 01/2019.                            Incidental - chronic loose stools Medicines:                Monitored Anesthesia Care Procedure:                Pre-Anesthesia Assessment:                           - Prior to the procedure, a History and Physical                            was performed, and patient medications and                            allergies were reviewed. The patient's tolerance of                            previous anesthesia was also reviewed. The risks                            and benefits of the procedure and the sedation                            options and risks were discussed with the patient.                            All questions were answered, and informed consent                            was obtained. Prior Anticoagulants: The patient has                            taken Eliquis (apixaban), last dose was 1 day prior                            to procedure. ASA Grade Assessment: III - A patient                            with severe systemic disease. After reviewing the  risks and benefits, the patient was deemed in                            satisfactory condition to undergo the procedure.                           After obtaining informed consent, the colonoscope                            was passed under direct vision. Throughout the                            procedure, the patient's blood pressure, pulse, and                             oxygen saturations were monitored continuously. The                            Olympus PCF-[id number] (#[id number]) Colonoscope was                            introduced through the anus and advanced to the the                            cecum, identified by appendiceal orifice and                            ileocecal valve. The colonoscopy was technically                            difficult and complex due to a redundant colon and                            significant looping. The patient tolerated the                            procedure well. The quality of the bowel                            preparation was adequate. The ileocecal valve,                            appendiceal orifice, and rectum were photographed. Scope In: 2:09:44 PM Scope Out: 2:57:13 PM Scope Withdrawal Time: 0 hours 23 minutes 38 seconds  Total Procedure Duration: 0 hours 47 minutes 29 seconds  Findings:                 The perianal and digital rectal examinations were                            normal.                           A 3 mm polyp was found in the cecum. The polyp was  flat. The polyp was removed with a cold snare.                            Resection and retrieval were complete.                           Four sessile polyps were found in the transverse                            colon. The polyps were 3 to 4 mm in size. These                            polyps were removed with a cold snare. Resection                            and retrieval were complete.                           A 4 mm polyp was found in the descending colon. The                            polyp was sessile. The polyp was removed with a                            cold snare. Resection and retrieval were complete.                           Many medium-mouthed diverticula were found in the                            entire colon.                           The colon was long / redundant, with significant                             looping and spams. Cecal intubation was                            challenging. Due to this issue, spasm, significant                            lavage required to clear prep, procedure was                            prolonged.                           Internal hemorrhoids were found during                            retroflexion. The hemorrhoids were small.  The exam was otherwise without abnormality.                           Biopsies for histology were taken with a cold                            forceps from the right colon, left colon and                            transverse colon for evaluation of microscopic                            colitis. Complications:            No immediate complications. Estimated blood loss:                            Minimal. Estimated Blood Loss:     Estimated blood loss was minimal. Impression:               - One 3 mm polyp in the cecum, removed with a cold                            snare. Resected and retrieved.                           - Four 3 to 4 mm polyps in the transverse colon,                            removed with a cold snare. Resected and retrieved.                           - One 4 mm polyp in the descending colon, removed                            with a cold snare. Resected and retrieved.                           - Diverticulosis in the entire examined colon.                           - Redundant / long colon, with spasm, which                            prolonged the exam.                           - Internal hemorrhoids.                           - The examination was otherwise normal.                           - Biopsies were taken with a cold forceps from the  right colon, left colon and transverse colon for                            evaluation of microscopic colitis. Recommendation:           - Patient has a contact number available for                             emergencies. The signs and symptoms of potential                            delayed complications were discussed with the                            patient. Return to normal activities tomorrow.                            Written discharge instructions were provided to the                            patient.                           - Resume previous diet.                           - Continue present medications.                           - Resume Eliquis tomorrow                           - Await pathology results. Anticipate no further                            surveillance exams due to age given no high risk                            lesions on this exam [name]. Shilpa Bushee, MD 03/07/2022 3:05:58 PM This report has been signed electronically.

## 2022-03-07 NOTE — Progress Notes (Signed)
Called to room to assist during endoscopic procedure.  Patient ID and intended procedure confirmed with present staff. Received instructions for my participation in the procedure from the performing physician.  

## 2022-03-07 NOTE — Progress Notes (Deleted)
Chronic Care Management Pharmacy Note  03/07/2022 Name:  Breanna Webster MRN:  [id number] DOB:  Sep 27, 1945  Summary: A1c is at goal < 7% Pt inquired about starting Ozempic Pt stopped omeprazole on her own  Recommendations/Changes made from today's visit: -Recommended discussing with endo about starting a CGM and Ozempic -Inquired about coverage/price of Ozempic and CGM monitor  -Recommended restarting omeprazole at least once daily  Plan: Follow up BP/DM assessment in 2 months Follow up in 4 months   Subjective: Breanna Webster is an 76 y.o. year old female who is a primary patient of [name], NP.  The CCM team was consulted for assistance with disease management and care coordination needs.    Engaged with patient by telephone for follow up visit in response to provider referral for pharmacy case management and/or care coordination services.   Consent to Services:  The patient was given information about Chronic Care Management services, agreed to services, and gave verbal consent prior to initiation of services.  Please see initial visit note for detailed documentation.   Patient Care Team: [name], NP as PCP - General (Family Medicine) [name], MD as PCP - Cardiology (Cardiology) Madelin Rear, MD as Consulting Physician (Endocrinology) Garvin Fila, MD as Referring Physician (Neurology) Viona Gilmore, [facility] as Pharmacist (Pharmacist)  Recent office visits: 02/06/22 [name], NP - Patient presented for shoulder and knee pain.  Prescribed tramadol PRN.  12/20/21 [name], NP - Patient presented via video for Nasal congestion and other concerns. Prescribed Molnupiravir.    11/20/21 [name], RN - Patient presented for Nurse CCM Visit. No medication changes.   11/16/21 [name], NP - Patient presented for Frequent urination and other concerns. No medication changes.   11/07/21 [name], [name], MD - Patient  presented for Dysuria and other concerns. Prescribed Macrobid.  10/12/21 [name], NP: Patient presented for annual exam. Prescribed furosemide 40 mg daily x 3 days.  Recent consult visits: 02/27/22 Jayme Cloud, RN (endoscopy center): Patient presented for EGD and colonoscopy prep.  02/26/22 Fabienne Bruns, MD (hem/onc): Patient presented for iron deficiency anemia. Follow up in 12 months.  02/09/22 Richardson Dopp, PA-C (cardiology): Patient presented for CHF follow up. Recommend SGLT2 inhibitor and pt to discuss with endo. Follow up in 3 months.  01/11/22 Minneola Cellar, MD (gastro): Patient presented for altered bowel habits.  Needs EGD and colonoscopy. Plan to get cardiac clearance.  01/02/22 Daneen Schick, MD (cardiology): Patient presented for CHF follow up. Recommended Lasix 20 mg daily. Follow up BMET in 1 week.  01/02/22 [name], MD(Cardiology) - Patient presented for Acute on CHF and other concerns. Changed Furosemide.   01/11/22 [name], [name], MD [name]) - Patient presented for Altered bowel habits and other concerns. Prescribed Dicyclomine HCL. Prescribed Loperamide HCL. Stopped Vibegron 75 mg.   12/19/21 [name], MD (Cardiology) - Patient presented for Acute on chronic diastolic CHF and other concerns. Changed Furosemide 40 mg. Changed Isophane. Increased Insulin Regular Human. Increased Levothyroxine.    12/04/21 [name], PT - Patient presented for Other abnormalities of gait and mobility and other concerns. No medication changes.   11/27/21 [name], PT - Patient presented for Other abnormalities of gait and mobility and other concerns. No medication changes.   11/20/21 [name], MD (Cardiology) - Patient presented for Chronic diastolic heart failure and other concerns. Stopped Estradiol. Stopped Furosemide.   11/15/21 [name], [name], MD - Patient presented  to [facility] for Osteoarthritis of right knee joint. No medication  changes.    10/30/21 [name], PT (outpatient rehab): Patient presented for PT treatment for right knee.  10/25/21 Larey Seat, MD (neurology): Patient presented for stroke follow up. Patient did not tolerate Sinemet and switching to Requip 0.25 mg TID. Follow up in 5-6 months.  09/06/21 Fabienne Bruns, MD (hem/onc): Patient presented for iron deficiency anemia. Follow up in 12 months.   09/04/21 Madelin Rear, MD [facility] - Patient presented for Type 2 diabetes mellitus with other diabetic neuro complication and other concerns. No medication changes. Unable to access notes.  08/18/21 [place] Cellar, MD [name]): Patient presented for diarrhea. Trial of Citrucel once daily. Follow up in June for ultrasound.  08/14/21 [name], NP (neurology): Patient presented for stroke follow up. Recommended restarting OT and continuing PT.  07/19/21 Patient presented to [facility] for Carotid.   06/27/21 Tanda Rockers, MD (Pulmonology) - Patient presented for Upper airway cough syndrome. Prescribed Acetaminophen Codeine. Stopped Amoxicillin   06/21/21 Dohmeier, Asencion Partridge, MD (Neurology) - Patient presented for Parkinsonism unspecified and other concerns. Prescribed Carbidopa - Levodopa.    Hospital visits: None in previous 6 months.   Objective:  Lab Results  Component Value Date   CREATININE 0.87 02/26/2022   BUN 23 02/26/2022   GFR 79.08 09/22/2021   GFRNONAA >60 02/26/2022   GFRAA 99 12/05/2020   NA 137 02/26/2022   K 4.0 02/26/2022   CALCIUM 11.0 (H) 02/26/2022   CO2 24 02/26/2022   GLUCOSE 137 (H) 02/26/2022    Lab Results  Component Value Date/Time   HGBA1C 7.4 (H) 09/08/2020 04:16 AM   HGBA1C 6.5 05/05/2020 12:00 AM   HGBA1C 7.5 (H) 10/22/2018 04:45 AM   FRUCTOSAMINE 373 (H) 03/08/2016 11:43 AM   GFR 79.08 09/22/2021 10:56 AM   GFR 75.63 03/13/2021 11:54 AM   MICROALBUR 0.8 10/07/2015 01:30 PM   MICROALBUR 1.1 02/08/2014  02:49 PM    Last diabetic Eye exam:  Lab Results  Component Value Date/Time   HMDIABEYEEXA Retinopathy (A) 08/29/2020 12:00 AM    Last diabetic Foot exam: No results found for: "HMDIABFOOTEX"   Lab Results  Component Value Date   CHOL 106 09/06/2021   HDL 50 09/06/2021   LDLCALC 36 09/06/2021   LDLDIRECT 98.0 05/24/2014   TRIG 101 09/06/2021   CHOLHDL 2.1 09/06/2021       Latest Ref Rng & Units 02/26/2022   10:37 AM 09/22/2021   10:56 AM 09/06/2021   10:05 AM  Hepatic Function  Total Protein 6.5 - 8.1 g/dL 7.7  7.2  7.1   Albumin 3.5 - 5.0 g/dL 4.5  4.1  4.1   AST 15 - 41 U/L _0 ALT 0 - 44 U/L 32  14  31   Alk Phosphatase 38 - 126 U/L 86  93  96   Total Bilirubin 0.3 - 1.2 mg/dL 1.2  1.0  0.9     Lab Results  Component Value Date/Time   TSH 1.44 12/05/2020 12:00 AM   TSH 1.47 05/05/2020 12:00 AM   TSH 5.935 (H) 04/30/2019 11:49 AM   TSH 9.460 (H) 11/12/2018 09:39 AM   TSH 0.322 (L) 10/21/2018 03:30 PM   TSH 0.79 08/05/2017 11:52 AM   FREET4 0.89 04/30/2019 11:49 AM   FREET4 1.78 (H) 10/21/2018 03:30 PM       Latest Ref Rng & Units 02/26/2022  10:37 AM 02/06/2022   12:17 PM 09/22/2021   10:56 AM  CBC  WBC 4.0 - 10.5 K/uL 7.5  7.3  6.8   Hemoglobin 12.0 - 15.0 g/dL 11.7  11.0  10.9   Hematocrit 36.0 - 46.0 % 36.9  35.3  34.9   Platelets 150 - 400 K/uL 159  202.0  173.0     Lab Results  Component Value Date/Time   VD25OH 34.2 02/26/2017 11:16 AM   VD25OH 28.0 (L) 11/21/2016 11:13 AM   VD25OH 20.86 (L) 05/29/2016 11:16 AM    Clinical ASCVD: Yes  The ASCVD Risk score (Arnett DK, et al., 2019) failed to calculate for the following reasons:   The patient has a prior MI or stroke diagnosis       04/03/2021    1:29 PM 10/03/2020    1:18 PM 03/14/2020   11:39 AM  Depression screen PHQ 2/9  Decreased Interest 0 0 0  Down, Depressed, Hopeless 0 0 0  PHQ - 2 Score 0 0 0  Altered sleeping   0  Tired, decreased energy   0  Change in appetite   0   Feeling bad or failure about yourself    0  Trouble concentrating   0  Moving slowly or fidgety/restless   0  Suicidal thoughts   0  PHQ-9 Score   0  Difficult doing work/chores   Not difficult at all   CHA2DS2/VAS Stroke Risk Points  Current as of 15 minutes ago     9 >= 2 Points: High Risk  1 - 1.99 Points: Medium Risk  0 Points: Low Risk    Last Change: N/A      Details    This score determines the patient's risk of having a stroke if the  patient has atrial fibrillation.       Points Metrics  1 Has Congestive Heart Failure:  Yes    Current as of 15 minutes ago  1 Has Vascular Disease:  Yes    Current as of 15 minutes ago  1 Has Hypertension:  Yes    Current as of 15 minutes ago  2 Age:  1    Current as of 15 minutes ago  1 Has Diabetes:  Yes    Current as of 15 minutes ago  2 Had Stroke:  Yes  Had TIA:  No  Had Thromboembolism:  No    Current as of 15 minutes ago  1 Female:  Yes    Current as of 15 minutes ago            Social History   Tobacco Use  Smoking Status Never   Passive exposure: Never  Smokeless Tobacco Never   BP Readings from Last 3 Encounters:  02/26/22 (!) 124/56  02/09/22 100/62  02/06/22 100/70   Pulse Readings from Last 3 Encounters:  02/26/22 77  02/09/22 86  02/06/22 79   Wt Readings from Last 3 Encounters:  02/27/22 173 lb 9.6 oz (78.7 kg)  02/26/22 173 lb 6.4 oz (78.7 kg)  02/09/22 178 lb (80.7 kg)   BMI Readings from Last 3 Encounters:  02/27/22 28.89 kg/m  02/26/22 28.86 kg/m  02/09/22 29.62 kg/m    Assessment/Interventions: Review of patient past medical history, allergies, medications, health status, including review of consultants reports, laboratory and other test data, was performed as part of comprehensive evaluation and provision of chronic care management services.   SDOH:  (Social Determinants of Health) assessments and interventions  performed: Yes  *** SDOH Interventions    Flowsheet Row Telephone from  03/01/2022 in [place] Management from 11/06/2021 in [place] at [place] Management from 02/20/2021 in [place] at [place] Management from 10/03/2020 in [place] at [place] from 03/14/2020 in [place] at [place] Management from 10/29/2019 in [place] at [place] Interventions Intervention Not Indicated -- -- Intervention Not Indicated -- --  Housing Interventions Intervention Not Indicated -- -- Intervention Not Indicated Intervention Not Indicated --  Transportation Interventions Intervention Not Indicated -- Intervention Not Indicated Other (Comment)  [Care to give resources if needs change] Intervention Not Indicated Intervention Not Indicated  Utilities Interventions Intervention Not Indicated -- -- -- -- --  Depression Interventions/Treatment  -- -- -- -- PHQ2-9 Score <4 Follow-up Not Indicated --  Financial Strain Interventions -- Other (Comment)  [working on coverage options for GLP1] Intervention Not Indicated -- Intervention Not Indicated --  Physical Activity Interventions -- -- -- -- Intervention Not Indicated --  Stress Interventions -- -- -- Patient Refused  [Declines LCSW referral] -- --  Social Connections Interventions -- -- -- -- Intervention Not Indicated --       SDOH Screenings   Food Insecurity: No Food Insecurity (03/01/2022)  Housing: Low Risk  (03/01/2022)  Transportation Needs: No Transportation Needs (03/01/2022)  Utilities: Not At Risk (03/01/2022)  Alcohol Screen: Low Risk  (03/14/2020)  Depression (PHQ2-9): Low Risk  (04/03/2021)  Financial Resource Strain: Medium Risk (11/13/2021)  Physical Activity: Inactive (04/03/2021)  Social Connections: Socially Integrated (04/03/2021)  Stress: No Stress Concern Present (04/03/2021)  Tobacco Use: Low Risk   (02/27/2022)    [place]  Allergies  Allergen Reactions   Statins Other (See Comments)    Muscle aches    Medications Reviewed Today     Reviewed by Marlou Starks, RN (Registered Nurse) on 02/27/22 at 9  Med List Status: <None>   Medication Order Taking? Sig Documenting Provider Last Dose Status Informant  apixaban (ELIQUIS) 5 MG TABS tablet [id number] Yes Take 1 tablet (5 mg total) by mouth 2 (two) times daily. [name], MD Taking Active   b complex vitamins capsule [id number] Yes Take 1 capsule by mouth daily. [provider] Taking Active   BD PEN NEEDLE NANO 2ND GEN 32G X 4 MM MISC [id number] Yes  [provider] Taking Active   cholecalciferol (VITAMIN D) 25 MCG (1000 UNIT) tablet [id number] Yes Take 1,000 Units by mouth daily. [provider] Taking Active Self  dicyclomine (BENTYL) 10 MG capsule [id number] No Take 1 capsule (10 mg total) by mouth every 8 (eight) hours as needed for spasms.  Patient not taking: Reported on 02/09/2022   Yetta Flock, MD Not Taking Active   diltiazem (CARDIZEM CD) 120 MG 24 hr capsule [id number] Yes Take 1 capsule (120 mg total) by mouth daily. [name], MD Taking Active   escitalopram (LEXAPRO) 20 MG tablet [id number] Yes TAKE ONE TABLET BY MOUTH DAILY Nafziger, Tommi Rumps, NP Taking Active   Evolocumab (REPATHA SURECLICK) 245 MG/ML SOAJ [id number] Yes Inject 1 dose into the skin every 14 (fourteen) days. [name], MD Taking Active   furosemide (LASIX) 20 MG tablet [id number] Yes Take 2 tablets (46m) by mouth daily on 9/13, 9/14, and 9/15. Then take 1 tablet (27m by mouth daily.  Patient taking  differently: Take 20 mg by mouth every other day. Take 2 tablets (61m) by mouth daily on 9/13, 9/14, and 9/15. Then take 1 tablet (280m by mouth daily.   SmBelva CromeMD Taking Active   glucose blood (ORiver[facility] Ambulatory Surgery CenterERIO) test strip [id number] USE TO CHECK BLOOD SUGAR THREE TIMES DAILY  Taking Active    insulin NPH Human (HUMULIN N) 100 UNIT/ML injection [id number] Inject 20 units under the skin in the morning and 14 units in evening  Taking Active   Insulin NPH, Human,, Isophane, (HUMULIN N KWIKPEN) 100 UNIT/ML KiMayer [id number] Inject 20 units into the skin every morning and 14 units every evening.  Taking Active   Insulin Pen Needle (UNIFINE PENTIPS) 32G X 4 MM MISC [id number] Inject 4 times subcutaneously  Taking Active   insulin regular (HUMULIN R) 100 units/mL injection [id number] Inject 10 Units into the skin 3 (three) times daily before meals.  Taking Active   insulin regular (HUMULIN R) 100 units/mL injection [id number] Inject 10 Units total into the skin 3 (three) times daily before meals.  Taking Active   Insulin Regular Human (NOVOLIN R FLEXPEN RELION) 100 UNIT/ML KwikPen [id number] Inject 5 to 10 units under the skin three times daily.  Patient not taking: Reported on 02/27/2022    Not Taking Active   Insulin Syringe-Needle U-100 (ULTICARE INSULIN SYRINGE) 31G X 5/16" 1 ML MISC [id number] Inject 4 times daily subcutaneously NaDorothyann PengNP Taking Active   Lancets (ONETOUCH DELICA PLUS LALYYTKP54SMIVineyard [id number] Use twice daily  Taking Active   levothyroxine (SYNTHROID) 137 MCG tablet [id number] Take 137 mcg by mouth daily before breakfast. [provider] Taking Active   levothyroxine (SYNTHROID) 137 MCG tablet [id number] Take 1 tablet by mouth every morning on an empty stomach  BRAND NAME ONLY  Taking Active   loperamide (IMODIUM A-D) 2 MG tablet [id number] Take 1 tablet (2 mg total) by mouth as needed for diarrhea or loose stools. ArYetta FlockMD Taking Active   losartan (COZAAR) 50 MG tablet [id number] TAKE ONE TABLET BY MOUTH DAILY SmBelva CromeMD Taking Active   meclizine (ANTIVERT) 25 MG tablet [id number] Take 1 tablet (25 mg total) by mouth 3 (three) times daily as needed for dizziness. Nafziger, CoTommi RumpsNP Taking Active    metFORMIN (GLUCOPHAGE-XR) 500 MG 24 hr tablet [id number] Take 1 tablet by mouth 2 times daily  Taking Active   Multiple Vitamin (MULITIVITAMIN WITH MINERALS) TABS [id number] Take 1 tablet by mouth daily. [provider] Taking Active Self  nitroGLYCERIN (NITROSTAT) 0.4 MG SL tablet [id number] Place 1 tablet (0.4 mg total) under the tongue every 5 (five) minutes as needed for chest pain.  Patient not taking: Reported on 02/27/2022   SmBelva CromeMD Not Taking Active   NOVOLIN N 100 UNIT/ML injection [id number] Inject 28 Units into the skin daily before breakfast. Taking 20 units every morning and 8 units every evening  Patient not taking: Reported on 02/27/2022   [provider] Not Taking Active Self  omeprazole (PRILOSEC) 20 MG capsule [id number] Take 1 capsule (20 mg total) by mouth 2 (two) times daily before a meal.  Patient taking differently: Take 20 mg by mouth daily.   [name], [name], PA-C Taking Active   ondansetron (ZOFRAN) 4 MG tablet [id number] Take 1 tablet (4 mg total) by mouth every 8 (eight) hours as needed for  nausea or vomiting.  Patient not taking: Reported on 02/27/2022   [name], NP Not Taking Active   potassium chloride SA (KLOR-CON M) 20 MEQ tablet [id number] Yes Take 1 tablet (20 mEq total) by mouth daily as needed. Take with Furosemide (Lasix). [name], MD Taking Active   rOPINIRole (REQUIP) 0.25 MG tablet [id number] No Take 1 tablet (0.25 mg total) by mouth 3 (three) times daily.  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   Left hemiparesis ([place]) 06/21/2021   Other specified hereditary hemolytic anemias ([place]) 06/21/2021   Parkinsonism 06/21/2021   COVID-19 02/14/2021   Postviral fatigue syndrome  02/14/2021   Difficulty with adaptive servo-ventilation (ASV) use 02/14/2021   Sepsis secondary to UTI ([place]) 09/08/2020   Elevated ALT measurement 09/08/2020   History of CVA with residual deficit 09/08/2020   Hyperbilirubinemia 09/08/2020   Fatigue associated with anemia 08/03/2020   Cerebrovascular accident (CVA) due to embolism of right posterior cerebral artery ([place]) 08/03/2020   OSA treated with BiPAP 08/03/2020   Complex sleep apnea syndrome 08/03/2020   Treatment-emergent central sleep apnea 08/03/2020   Chronic intermittent hypoxia with obstructive sleep apnea 04/21/2020   OSA (obstructive sleep apnea) 04/21/2020   History of cardioembolic stroke 24/58/0998   Gait disturbance, post-stroke 03/29/2020   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 ([place]) 03/29/2020   Anxiety    RLQ abdominal pain 10/22/2019   Paroxysmal atrial fibrillation ([place]) 05/22/2019   Iron deficiency anemia 05/07/2019   Atrial fibrillation with RVR ([place]) 10/21/2018   Cerebellar stroke, acute ([place]) 10/21/2018   Streptococcal endocarditis    Endocarditis of mitral valve 10/07/2018   Bacteremia due to Streptococcus Salivarius 10/07/2018   Sepsis ([place]) 33/82/5053   Acute metabolic encephalopathy 97/67/3419   Severe aortic stenosis 07/08/2018   S/P TAVR (transcatheter aortic valve replacement) 07/08/2018   Esophageal thickening    Coronary artery disease involving native coronary artery of native heart without angina pectoris 04/22/2018   Gastroesophageal reflux disease    Pulmonary hypertension ([place]) 02/21/2018   Essential hypertension 07/15/2017   History of colonic polyps 05/22/2017   Elevated liver function tests 12/05/2016   DOE (dyspnea on exertion) 07/19/2016   Thalassemia minor 05/29/2016   Left bundle branch block 12/06/2015   Upper airway cough syndrome 10/14/2015   (HFpEF) heart failure with preserved ejection fraction ([place]) 10/10/2015   Myalgia 02/17/2014   Carotid artery stenosis  06/05/2013   Eustachian tube dysfunction 05/07/2013   Neuropathy of leg 03/07/2012   Anemia, unspecified 01/31/2012   Hot flashes 08/24/2010   Diabetes mellitus due to underlying condition, uncontrolled 06/30/2010   Generalized abdominal pain 03/24/2010   Obesity (BMI 30.0-34.9) 12/21/2009   IBS (irritable bowel syndrome) 04/29/2009   Vitamin D deficiency 03/10/2009   Acute bronchitis 03/07/2009   Hypothyroidism 12/13/2008   Dyslipidemia 12/13/2008   Anxiety state 12/13/2008   Other specified disorders of bladder 12/13/2008    Immunization History  Administered Date(s) Administered   Fluad Quad(high Dose 65+) 01/20/2019, 01/06/2020   Hep A / Hep B 06/15/2015, 07/15/2015, 12/27/2015   Influenza Split 01/16/2012   Influenza Whole 01/31/2010   Influenza, High Dose Seasonal PF 01/28/2014, 12/29/2014, 02/17/2016, 01/21/2018   Influenza-Unspecified 12/29/2012, 01/21/2021   PFIZER(Purple Top)SARS-COV-2 Vaccination 05/16/2019, 06/06/2019, 02/06/2020,  02/05/2021   Pneumococcal Conjugate-13 06/14/2017   Pneumococcal Polysaccharide-23 01/28/2012, 01/28/2014   Pneumococcal-Unspecified 04/24/2015   Tdap 12/29/2014   Zoster Recombinat (Shingrix) 03/13/2017, 05/24/2017   Zoster, Live 02/14/2012   Patient was taking her insulins and they were expired for 3 months so she is wondering if this is related to why her blood sugar is higher. She is switching endocrinologists and is going to ask about the price of Ozempic.  Patient reports her blood sugars are going up and this scares her and she is having symptoms from the new medications.  Patient is not liking the Requip and wants to get off that. Patient has a very bad tremor in her left hand and has been making her dizziness and nauseous.  Patient is going on a cruise in August that she is very excited about this.  Conditions to be addressed/monitored:  Hypertension, Hyperlipidemia, Diabetes, Atrial Fibrillation, Coronary Artery Disease, GERD,  Hypothyroidism, Overactive Bladder, and Neuropathy  Conditions addressed this visit: Hypertension, diabetes, hyperlipidemia  There are no care plans that you recently modified to display for this patient.    Medication Assistance:  Insulins obtained through [facility] medication assistance program.  Enrollment ends 04/21/21  Compliance/Adherence/Medication fill history: Care Gaps: Foot exam, COVID booster, A1c, eye exam, urine microalbumin, influenza, colonoscopy Last BP - 136/62 on 10/25/2021 Last A1C - 7.4 on 09/08/2020  Star-Rating Drugs: Losartan 50 mg - last filled 02/10/2022 90 DS at [place] Metformin 500 mg - last filled 10/212023 90 DS at [place]  Patient's preferred pharmacy is:  [place] [address] Phone: [phone] Fax: [phone]   Uses pill box? Yes Pt endorses 90% compliance  We discussed: Current pharmacy is preferred with insurance plan and patient is satisfied with pharmacy services Patient decided to: Continue current medication management strategy  Care Plan and Follow Up Patient Decision:  Patient agrees to Care Plan and Follow-up.  Plan: The care management team will reach out to the patient again over the next 14 days.  [name], PharmD, [place] Pharmacist [place] at [place]

## 2022-03-07 NOTE — Progress Notes (Signed)
Sedate, gd SR, tolerated procedure well, VSS, report to RN 

## 2022-03-07 NOTE — Progress Notes (Signed)
[facility] History and Physical   Primary Care Physician:  [name], NP   Reason for Procedure:   Cirrhosis - rule out varices, Barrett's, surveillance of colon polyps - also with history of loose stools  Plan:    EGD and colonoscopy     HPI: [facility] is a 76 y.o. female  here for EGD and colonoscopy to evaluate issues above. She has had 36 polyps removed on her last 2 exams. Last EGD And colon in 2020. No varices at that time.. She does have some chronic intermittent loose stools  Otherwise feels well without any cardiopulmonary symptoms. On Eliquis, held for 1 days for this exam.  I have discussed risks / benefits of anesthesia and endoscopic procedure with [name] and they wish to proceed with the exams as outlined today.    Past Medical History:  Diagnosis Date   Anxiety    Arthritis    "back" (04/22/2018)   Back pain    Blood transfusion without reported diagnosis    CAD (coronary artery disease)    a. 03/2018 s/p PCI/DES to the RCA (3.0x15 Onyx DES).   Carotid artery stenosis    Mild   Chest pain    CHF (congestive heart failure) (HCC)    Chronic lower back pain    Cirrhosis (HCC)    Colon polyps    Diverticulitis    Diverticulosis    Esophageal thickening    seen on pre TAVR CT scan, also questionable cirrhosis. MRI recommended. Will refer to GI   Fatty liver    GERD (gastroesophageal reflux disease)    Grave's disease    Heart murmur    History of colonic polyps 05/22/2017   History of hiatal hernia    Hyperlipidemia    Hypertension    Hypothyroidism    IBS (irritable bowel syndrome)    Osteopenia    Parkinson's syndrome 04/23/2020   Pulmonary nodules    seen on pre TAVR CT. likley benign. no follow up recommended if pt low risk.   S/P TAVR (transcatheter aortic valve replacement)    Severe aortic stenosis    Shortness of breath on exertion    Stroke ([place])    Thalassemia minor    Thyroid disease    Type II  diabetes mellitus ([place])     Past Surgical History:  Procedure Laterality Date   11 HOUR [place] STUDY N/A 03/03/2018   Procedure: 24 HOUR [place] STUDY;  Surgeon: Mauri Pole, MD;  Location: [facility];  Service: Endoscopy;  Laterality: N/A;   AORTIC VALVE REPLACEMENT  06/2018   COLONOSCOPY     COLONOSCOPY W/ BIOPSIES AND POLYPECTOMY     CORONARY ANGIOGRAPHY Right 04/21/2018   Procedure: CORONARY ANGIOGRAPHY (CATH LAB);  Surgeon: [name], MD;  Location: [place] [facility];  Service: Cardiovascular;  Laterality: Right;   CORONARY STENT INTERVENTION N/A 04/22/2018   Procedure: CORONARY STENT INTERVENTION;  Surgeon: [name], MD;  Location: [place] [facility];  Service: Cardiovascular;  Laterality: N/A;   DILATION AND CURETTAGE OF UTERUS     ESOPHAGEAL MANOMETRY N/A 03/03/2018   Procedure: ESOPHAGEAL MANOMETRY (EM);  Surgeon: Mauri Pole, MD;  Location: [facility];  Service: Endoscopy;  Laterality: N/A;   GASTRIC FUNDOPLICATION     HERNIA REPAIR     HYSTEROSCOPY     fibroids   LAPAROSCOPIC CHOLECYSTECTOMY     LAPAROSCOPY     fibroids   NISSEN FUNDOPLICATION  [id number]  POLYPECTOMY     RIGHT/LEFT HEART CATH AND CORONARY ANGIOGRAPHY N/A 02/20/2018   Procedure: RIGHT/LEFT HEART CATH AND CORONARY ANGIOGRAPHY;  Surgeon: [name], MD;  Location: [place] [facility];  Service: Cardiovascular;  Laterality: N/A;   TEE WITHOUT CARDIOVERSION N/A 07/08/2018   Procedure: TRANSESOPHAGEAL ECHOCARDIOGRAM (TEE);  Surgeon: Burnell Blanks, MD;  Location: [place];  Service: Open Heart Surgery;  Laterality: N/A;   TEE WITHOUT CARDIOVERSION  10/07/2018   TEE WITHOUT CARDIOVERSION N/A 10/07/2018   Procedure: TRANSESOPHAGEAL ECHOCARDIOGRAM (TEE);  Surgeon: Jerline Pain, MD;  Location: [facility];  Service: Cardiovascular;  Laterality: N/A;   TONSILLECTOMY     TRANSCATHETER AORTIC VALVE REPLACEMENT, TRANSFEMORAL N/A 07/08/2018   Procedure: TRANSCATHETER AORTIC VALVE  REPLACEMENT, TRANSFEMORAL;  Surgeon: Burnell Blanks, MD;  [facility]: [facility];  Service: Open Heart Surgery;  Laterality: N/A;    Prior to Admission medications   Medication Sig Start Date End Date Taking? Authorizing Provider  b complex vitamins capsule Take 1 capsule by mouth daily.   Yes [provider]  BD PEN NEEDLE NANO 2ND GEN 32G X 4 MM MISC  11/26/20  Yes [provider]  cholecalciferol (VITAMIN D) 25 MCG (1000 UNIT) tablet Take 1,000 Units by mouth daily.   Yes [provider]  diltiazem (CARDIZEM CD) 120 MG 24 hr capsule Take 1 capsule (120 mg total) by mouth daily. 01/02/22  Yes [name], MD  escitalopram (LEXAPRO) 20 MG tablet TAKE ONE TABLET BY MOUTH DAILY 10/12/21  Yes Nafziger, Tommi Rumps, NP  furosemide (LASIX) 20 MG tablet Take 2 tablets (39m) by mouth daily on 9/13, 9/14, and 9/15. Then take 1 tablet (245m by mouth daily. Patient taking differently: Take 20 mg by mouth every other day. Take 2 tablets (4011mby mouth daily on 9/13, 9/14, and 9/15. Then take 1 tablet (59m31my mouth daily. 01/02/22  Yes SmitBelva Crome  glucose blood (ONETOUCH VERIO) test strip USE TO CHECK BLOOD SUGAR THREE TIMES DAILY 06/23/21  Yes   insulin NPH Human (HUMULIN N) 100 UNIT/ML injection Inject 20 units under the skin in the morning and 14 units in evening 02/20/22  Yes   Insulin NPH, Human,, Isophane, (HUMULIN N KWIKPEN) 100 UNIT/ML Kiwkpen Inject 20 units into the skin every morning and 14 units every evening. 02/20/22  Yes   Insulin Pen Needle (UNIFINE PENTIPS) 32G X 4 MM MISC Inject 4 times subcutaneously 06/23/21  Yes   insulin regular (HUMULIN R) 100 units/mL injection Inject 10 Units into the skin 3 (three) times daily before meals. 01/28/22  Yes   insulin regular (HUMULIN R) 100 units/mL injection Inject 10 Units total into the skin 3 (three) times daily before meals. 02/20/22  Yes   Insulin Regular Human (NOVOLIN R FLEXPEN RELION) 100 UNIT/ML KwikPen Inject 5 to  10 units under the skin three times daily. 02/20/22  Yes   Insulin Syringe-Needle U-100 (ULTICARE INSULIN SYRINGE) 31G X 5/16" 1 ML MISC Inject 4 times daily subcutaneously 01/12/22  Yes Nafziger, CoryTommi Rumps  Lancets (ONETOUCH DELICA PLUS [id number] Use twice daily 06/23/21  Yes   levothyroxine (SYNTHROID) 137 MCG tablet Take 137 mcg by mouth daily before breakfast.   Yes [provider]  levothyroxine (SYNTHROID) 137 MCG tablet Take 1 tablet by mouth every morning on an empty stomach  BRAND NAME ONLY 01/24/22  Yes   losartan (COZAAR) 50 MG tablet TAKE ONE TABLET BY MOUTH DAILY 01/02/22  Yes SmitBelva Crome  MD  metFORMIN (GLUCOPHAGE-XR) 500 MG 24 hr tablet Take 1 tablet by mouth 2 times daily 09/25/21  Yes   Multiple Vitamin (MULITIVITAMIN WITH MINERALS) TABS Take 1 tablet by mouth daily.   Yes [provider]  NOVOLIN N 100 UNIT/ML injection Inject 28 Units into the skin daily before breakfast. Taking 20 units every morning and 8 units every evening   Yes [provider]  omeprazole (PRILOSEC) 20 MG capsule Take 1 capsule (20 mg total) by mouth 2 (two) times daily before a meal. Patient taking differently: Take 20 mg by mouth daily. 09/20/21  Yes [name], [name], PA-C  potassium chloride SA (KLOR-CON M) 20 MEQ tablet Take 1 tablet (20 mEq total) by mouth daily as needed. Take with Furosemide (Lasix). 11/22/21  Yes [name], MD  apixaban (ELIQUIS) 5 MG TABS tablet Take 1 tablet (5 mg total) by mouth 2 (two) times daily. 01/02/22   [name], MD  dicyclomine (BENTYL) 10 MG capsule Take 1 capsule (10 mg total) by mouth every 8 (eight) hours as needed for spasms. Patient not taking: Reported on 02/09/2022 01/11/22   Yetta Flock, MD  Evolocumab (REPATHA SURECLICK) 353 MG/ML SOAJ Inject 1 dose into the skin every 14 (fourteen) days. 04/11/21   [name], MD  loperamide (IMODIUM A-D) 2 MG tablet Take 1 tablet (2 mg total) by mouth as needed for diarrhea or loose  stools. 01/11/22   Zyiere Rosemond, [name], MD  meclizine (ANTIVERT) 25 MG tablet Take 1 tablet (25 mg total) by mouth 3 (three) times daily as needed for dizziness. 02/06/22   Nafziger, Tommi Rumps, NP  nitroGLYCERIN (NITROSTAT) 0.4 MG SL tablet Place 1 tablet (0.4 mg total) under the tongue every 5 (five) minutes as needed for chest pain. Patient not taking: Reported on 02/27/2022 11/14/21   [name], MD  ondansetron (ZOFRAN) 4 MG tablet Take 1 tablet (4 mg total) by mouth every 8 (eight) hours as needed for nausea or vomiting. Patient not taking: Reported on 02/27/2022 02/06/22   [name], NP  rOPINIRole (REQUIP) 0.25 MG tablet Take 1 tablet (0.25 mg total) by mouth 3 (three) times daily. Patient not taking: Reported on 02/09/2022 10/25/21   Dohmeier, Asencion Partridge, MD    Current Outpatient Medications  Medication Sig Dispense Refill   b complex vitamins capsule Take 1 capsule by mouth daily.     BD PEN NEEDLE NANO 2ND GEN 32G X 4 MM MISC      cholecalciferol (VITAMIN D) 25 MCG (1000 UNIT) tablet Take 1,000 Units by mouth daily.     diltiazem (CARDIZEM CD) 120 MG 24 hr capsule Take 1 capsule (120 mg total) by mouth daily. 90 capsule 3   escitalopram (LEXAPRO) 20 MG tablet TAKE ONE TABLET BY MOUTH DAILY 90 tablet 1   furosemide (LASIX) 20 MG tablet Take 2 tablets (10m) by mouth daily on 9/13, 9/14, and 9/15. Then take 1 tablet (214m by mouth daily. (Patient taking differently: Take 20 mg by mouth every other day. Take 2 tablets (4057mby mouth daily on 9/13, 9/14, and 9/15. Then take 1 tablet (73m82my mouth daily.) 90 tablet 3   glucose blood (ONETOUCH VERIO) test strip USE TO CHECK BLOOD SUGAR THREE TIMES DAILY 300 strip 4   insulin NPH Human (HUMULIN N) 100 UNIT/ML injection Inject 20 units under the skin in the morning and 14 units in evening 20 mL 5   Insulin NPH, Human,, Isophane, (HUMULIN N KWIKPEN) 100  UNIT/ML Kiwkpen Inject 20 units into the skin every morning and 14 units every evening. 15 mL  6   Insulin Pen Needle (UNIFINE PENTIPS) 32G X 4 MM MISC Inject 4 times subcutaneously 400 each 2   insulin regular (HUMULIN R) 100 units/mL injection Inject 10 Units into the skin 3 (three) times daily before meals. 10 mL 5   insulin regular (HUMULIN R) 100 units/mL injection Inject 10 Units total into the skin 3 (three) times daily before meals. 10 mL 5   Insulin Regular Human (NOVOLIN R FLEXPEN RELION) 100 UNIT/ML KwikPen Inject 5 to 10 units under the skin three times daily. 15 mL 6   Insulin Syringe-Needle U-100 (ULTICARE INSULIN SYRINGE) 31G X 5/16" 1 ML MISC Inject 4 times daily subcutaneously 100 each 1   Lancets (ONETOUCH DELICA PLUS [id number]) MISC Use twice daily 200 each 1   levothyroxine (SYNTHROID) 137 MCG tablet Take 137 mcg by mouth daily before breakfast.     levothyroxine (SYNTHROID) 137 MCG tablet Take 1 tablet by mouth every morning on an empty stomach  BRAND NAME ONLY 90 tablet 1   losartan (COZAAR) 50 MG tablet TAKE ONE TABLET BY MOUTH DAILY 90 tablet 3   metFORMIN (GLUCOPHAGE-XR) 500 MG 24 hr tablet Take 1 tablet by mouth 2 times daily 180 tablet 2   Multiple Vitamin (MULITIVITAMIN WITH MINERALS) TABS Take 1 tablet by mouth daily.     NOVOLIN N 100 UNIT/ML injection Inject 28 Units into the skin daily before breakfast. Taking 20 units every morning and 8 units every evening     omeprazole (PRILOSEC) 20 MG capsule Take 1 capsule (20 mg total) by mouth 2 (two) times daily before a meal. (Patient taking differently: Take 20 mg by mouth daily.) 60 capsule 3   potassium chloride SA (KLOR-CON M) 20 MEQ tablet Take 1 tablet (20 mEq total) by mouth daily as needed. Take with Furosemide (Lasix). 90 tablet 3   apixaban (ELIQUIS) 5 MG TABS tablet Take 1 tablet (5 mg total) by mouth 2 (two) times daily. 180 tablet 3   dicyclomine (BENTYL) 10 MG capsule Take 1 capsule (10 mg total) by mouth every 8 (eight) hours as needed for spasms. (Patient not taking: Reported on 02/09/2022) 30 capsule 1    Evolocumab (REPATHA SURECLICK) 828 MG/ML SOAJ Inject 1 dose into the skin every 14 (fourteen) days. 2 mL 11   loperamide (IMODIUM A-D) 2 MG tablet Take 1 tablet (2 mg total) by mouth as needed for diarrhea or loose stools. 30 tablet 0   meclizine (ANTIVERT) 25 MG tablet Take 1 tablet (25 mg total) by mouth 3 (three) times daily as needed for dizziness. 30 tablet 0   nitroGLYCERIN (NITROSTAT) 0.4 MG SL tablet Place 1 tablet (0.4 mg total) under the tongue every 5 (five) minutes as needed for chest pain. (Patient not taking: Reported on 02/27/2022) 25 tablet 4   ondansetron (ZOFRAN) 4 MG tablet Take 1 tablet (4 mg total) by mouth every 8 (eight) hours as needed for nausea or vomiting. (Patient not taking: Reported on 02/27/2022) 20 tablet 0   rOPINIRole (REQUIP) 0.25 MG tablet Take 1 tablet (0.25 mg total) by mouth 3 (three) times daily. (Patient not taking: Reported on 02/09/2022) 90 tablet 5   Current Facility-Administered Medications  Medication Dose Route Frequency Provider Last Rate Last Admin   0.9 %  sodium chloride infusion  500 mL Intravenous Once Kadyn Chovan, [name], MD        Allergies as  of 03/07/2022 - Review Complete 03/07/2022  Allergen Reaction Noted   Statins Other (See Comments) 08/14/2013    Family History  Adopted: Yes  Problem Relation Age of Onset   Healthy Son        x 2   Headache Other        Cluster headaches   Heart failure Mother    Colon cancer Neg Hx    Pancreatic cancer Neg Hx    Rectal cancer Neg Hx    Stomach cancer Neg Hx     Social History   Socioeconomic History   Marital status: Married    Spouse name: Not on file   Number of children: 2   Years of education: Not on file   Highest education level: Not on file  Occupational History   Occupation: Tree surgeon of the [employer]  Tobacco Use   Smoking status: Never    Passive exposure: Never   Smokeless tobacco: Never  Vaping Use   Vaping Use: Never used  Substance and  Sexual Activity   Alcohol use: No    Alcohol/week: 0.0 standard drinks of alcohol   Drug use: Never   Sexual activity: Not Currently  Other Topics Concern   Not on file  Social History Narrative   Lives with husband in a one story home.     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   Education: college.   Social Determinants of Health   Financial Resource Strain: Medium Risk (11/13/2021)   Overall Financial Resource Strain (CARDIA)    Difficulty of Paying Living Expenses: Somewhat hard  Food Insecurity: No Food Insecurity (03/01/2022)   Hunger Vital Sign    Worried About Running Out of Food in the Last Year: Never true    Ran Out of Food in the Last Year: Never true  Transportation Needs: No Transportation Needs (03/01/2022)   PRAPARE - Hydrologist (Medical): No    Lack of Transportation (Non-Medical): No  Physical Activity: Inactive (04/03/2021)   Exercise Vital Sign    Days of Exercise per Week: 0 days    Minutes of Exercise per Session: 0 min  Stress: No Stress Concern Present (04/03/2021)   [place]    Feeling of Stress : Not at all  Social Connections: [place] (04/03/2021)   Social Connection and Isolation Panel [NHANES]    Frequency of Communication with Friends and Family: More than three times a week    Frequency of Social Gatherings with Friends and Family: More than three times a week    Attends Religious Services: More than 4 times per year    Active Member of [employer] or Organizations: Yes    Attends Music therapist: More than 4 times per year    Marital Status: Married  Human resources officer Violence: Not At Risk (04/03/2021)   Humiliation, Afraid, Rape, and Kick questionnaire    Fear of Current or Ex-Partner: No    Emotionally Abused: No    Physically Abused: No    Sexually Abused:  No    Review of Systems: All other review of systems negative except as mentioned in the HPI.  Physical Exam: Vital signs BP 134/62   Pulse 68   Temp (!) 96.6 F (35.9 C) (Skin)   Ht 5' 5"  (1.651 m)   Wt 173 lb 9.6 oz (78.7 kg)  SpO2 100%   BMI 28.89 kg/m   General:   Alert,  Well-developed, pleasant and cooperative in NAD Lungs:  Clear throughout to auscultation.   Heart:  Regular rate and rhythm Abdomen:  Soft, nontender and nondistended.   Neuro/Psych:  Alert and cooperative. Normal mood and affect. A and O x 3  Jolly Mango, MD [facility]

## 2022-03-08 ENCOUNTER — Telehealth: Payer: Self-pay

## 2022-03-08 DIAGNOSIS — M19012 Primary osteoarthritis, left shoulder: Secondary | ICD-10-CM | POA: Diagnosis not present

## 2022-03-08 DIAGNOSIS — M25512 Pain in left shoulder: Secondary | ICD-10-CM | POA: Diagnosis not present

## 2022-03-08 NOTE — Telephone Encounter (Signed)
Follow up call, no answer.

## 2022-03-09 DIAGNOSIS — R350 Frequency of micturition: Secondary | ICD-10-CM | POA: Diagnosis not present

## 2022-03-09 DIAGNOSIS — R3915 Urgency of urination: Secondary | ICD-10-CM | POA: Diagnosis not present

## 2022-03-12 ENCOUNTER — Other Ambulatory Visit (HOSPITAL_BASED_OUTPATIENT_CLINIC_OR_DEPARTMENT_OTHER): Payer: Self-pay

## 2022-03-12 MED ORDER — GEMTESA 75 MG PO TABS
75.0000 mg | ORAL_TABLET | Freq: Every day | ORAL | 11 refills | Status: DC
  Filled 2022-03-12: qty 30, 30d supply, fill #0

## 2022-03-13 ENCOUNTER — Other Ambulatory Visit (HOSPITAL_BASED_OUTPATIENT_CLINIC_OR_DEPARTMENT_OTHER): Payer: Self-pay

## 2022-03-13 DIAGNOSIS — R350 Frequency of micturition: Secondary | ICD-10-CM | POA: Diagnosis not present

## 2022-03-13 DIAGNOSIS — R3915 Urgency of urination: Secondary | ICD-10-CM | POA: Diagnosis not present

## 2022-03-13 NOTE — Progress Notes (Signed)
Call to patient to reschedule missed encounter via phone with Watt Climes Pryor/ appointment given patient aware and in agreement.     [place] Clinical Pharmacist Assistant [phone]

## 2022-03-14 ENCOUNTER — Other Ambulatory Visit (HOSPITAL_BASED_OUTPATIENT_CLINIC_OR_DEPARTMENT_OTHER): Payer: Self-pay

## 2022-03-14 DIAGNOSIS — M25512 Pain in left shoulder: Secondary | ICD-10-CM | POA: Diagnosis not present

## 2022-03-14 DIAGNOSIS — E1165 Type 2 diabetes mellitus with hyperglycemia: Secondary | ICD-10-CM | POA: Diagnosis not present

## 2022-03-14 MED ORDER — FREESTYLE LIBRE READER DEVI: 0 refills | Status: DC

## 2022-03-14 MED ORDER — FREESTYLE LIBRE 3 SENSOR MISC
11 refills | Status: DC
  Filled 2022-03-14: qty 2, 30d supply, fill #0
  Filled 2022-09-14 – 2023-01-23 (×2): qty 2, 28d supply, fill #0

## 2022-03-19 ENCOUNTER — Telehealth: Payer: Self-pay

## 2022-03-19 DIAGNOSIS — K7469 Other cirrhosis of liver: Secondary | ICD-10-CM

## 2022-03-19 NOTE — Telephone Encounter (Signed)
-----   Message from Roetta Sessions, [place] sent at 01/11/2022 11:58 AM EDT ----- Regarding: ruq and labs due Dec Patient due for RUQ U/S and LFTs and AFP in Dec 2023

## 2022-03-19 NOTE — Telephone Encounter (Signed)
Orders are in. MyChart message sent to patient that she is due for liver ultrasound and labs. Message sent to scheduling to call patient to schedule RUQ U/S. Asked patient to go to the lab the same week as her ultrasound is scheduled.

## 2022-03-20 NOTE — Progress Notes (Deleted)
Cardiology Office Note:    Date:  03/20/2022   ID:  Breanna Webster, DOB January 09, 1946, MRN [id number]  PCP:  [name], NP  Cardiologist:  Sinclair Grooms, MD   Referring MD: [name], NP   No chief complaint on file.   History of Present Illness:    Breanna Webster is a 76 y.o. 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    GI endoscopy without complications in October or early November.  ***  Past Medical History:  Diagnosis Date   Anxiety    Arthritis    "back" (04/22/2018)   Back pain    Blood transfusion without reported diagnosis    CAD (coronary artery disease)    a. 03/2018 s/p PCI/DES to the RCA (3.0x15 Onyx DES).   Carotid artery stenosis    Mild   Chest pain    CHF (congestive heart failure) (HCC)    Chronic lower back pain    Cirrhosis (HCC)    Colon polyps    Diverticulitis    Diverticulosis    Esophageal thickening    seen on pre TAVR CT scan, also questionable cirrhosis. MRI recommended. Will refer to GI   Fatty liver    GERD (gastroesophageal reflux disease)    Grave's disease    Heart murmur    History of colonic polyps 05/22/2017   History of hiatal hernia    Hyperlipidemia    Hypertension    Hypothyroidism    IBS (irritable bowel syndrome)    Osteopenia    Parkinson's syndrome 04/23/2020   Pulmonary nodules    seen on pre TAVR CT. likley benign. no follow up recommended if pt low risk.   S/P TAVR (transcatheter aortic valve replacement)    Severe aortic stenosis    Shortness of breath on exertion    Stroke ([place])    Thalassemia minor    Thyroid disease    Type II diabetes mellitus ([place])     Past Surgical  History:  Procedure Laterality Date   69 HOUR [place] STUDY N/A 03/03/2018   Procedure: 24 HOUR PH STUDY;  Surgeon: Mauri Pole, MD;  [facility];  Service: Endoscopy;  Laterality: N/A;   AORTIC VALVE REPLACEMENT  06/2018   COLONOSCOPY     COLONOSCOPY W/ BIOPSIES AND POLYPECTOMY     CORONARY ANGIOGRAPHY Right 04/21/2018   Procedure: CORONARY ANGIOGRAPHY (CATH LAB);  Surgeon: [name], MD;  Location: [facility];  Service: Cardiovascular;  Laterality: Right;   CORONARY STENT INTERVENTION N/A 04/22/2018   Procedure: CORONARY STENT INTERVENTION;  Surgeon: [name], MD;  Location: [place] CV LAB;  Service: Cardiovascular;  Laterality: N/A;   DILATION AND CURETTAGE OF UTERUS     ESOPHAGEAL MANOMETRY N/A 03/03/2018   Procedure: ESOPHAGEAL MANOMETRY (EM);  Surgeon: Mauri Pole, MD;  [facility];  Service: Endoscopy;  Laterality: N/A;   GASTRIC FUNDOPLICATION     HERNIA REPAIR     HYSTEROSCOPY     fibroids   LAPAROSCOPIC CHOLECYSTECTOMY     LAPAROSCOPY     fibroids   NISSEN FUNDOPLICATION  4193X   POLYPECTOMY     RIGHT/LEFT HEART CATH AND CORONARY ANGIOGRAPHY N/A 02/20/2018  Procedure: RIGHT/LEFT HEART CATH AND CORONARY ANGIOGRAPHY;  Surgeon: [name], MD;  Location: [place] CV LAB;  Service: Cardiovascular;  Laterality: N/A;   TEE WITHOUT CARDIOVERSION N/A 07/08/2018   Procedure: TRANSESOPHAGEAL ECHOCARDIOGRAM (TEE);  Surgeon: Burnell Blanks, MD;  Location: [place];  Service: Open Heart Surgery;  Laterality: N/A;   TEE WITHOUT CARDIOVERSION  10/07/2018   TEE WITHOUT CARDIOVERSION N/A 10/07/2018   Procedure: TRANSESOPHAGEAL ECHOCARDIOGRAM (TEE);  Surgeon: Jerline Pain, MD;  Location: [facility] ENDOSCOPY;  Service: Cardiovascular;  Laterality: N/A;   TONSILLECTOMY     TRANSCATHETER AORTIC VALVE REPLACEMENT, TRANSFEMORAL N/A 07/08/2018   Procedure: TRANSCATHETER AORTIC VALVE REPLACEMENT, TRANSFEMORAL;  Surgeon: Burnell Blanks, MD;  Location: [place];  Service: Open Heart Surgery;  Laterality: N/A;    Current Medications: No outpatient medications have been marked as taking for the 03/22/22 encounter (Appointment) with [name], MD.     Allergies:   Statins   Social History   Socioeconomic History   Marital status: Married    Spouse name: Not on file   Number of children: 2   Years of education: Not on file   Highest education level: Not on file  Occupational History   Occupation: Retired-Director of the [employer]  Tobacco Use   Smoking status: Never    Passive exposure: Never   Smokeless tobacco: Never  Vaping Use   Vaping Use: Never used  Substance and Sexual Activity   Alcohol use: No    Alcohol/week: 0.0 standard drinks of alcohol   Drug use: Never   Sexual activity: Not Currently  Other Topics Concern   Not on file  Social History Narrative   Lives with husband in a one story home.     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   Education: college.   Social Determinants of Health   Financial Resource Strain: Medium Risk (11/13/2021)   Overall Financial Resource Strain (CARDIA)    Difficulty of Paying Living Expenses: Somewhat hard  Food Insecurity: No Food Insecurity (03/01/2022)   Hunger Vital Sign    Worried About Running Out of Food in the Last Year: Never true    Ran Out of Food in the Last Year: Never true  Transportation Needs: No Transportation Needs (03/01/2022)   PRAPARE - Hydrologist (Medical): No    Lack of Transportation (Non-Medical): No  Physical Activity: Inactive (04/03/2021)   Exercise Vital Sign    Days of Exercise per Week: 0 days    Minutes of Exercise per Session: 0 min  Stress: No Stress Concern Present (04/03/2021)   [employer]    Feeling of Stress : Not at all  Social  Connections: [place] (04/03/2021)   Social Connection and Isolation Panel [NHANES]    Frequency of Communication with Friends and Family: More than three times a week    Frequency of Social Gatherings with Friends and Family: More than three times a week    Attends Religious Services: More than 4 times per year    Active Member of [employer] or Organizations: Yes    Attends Music therapist: More than 4 times per year    Marital Status: Married     Family History: The patient's family history includes Headache in an other family member; Healthy in her son; Heart failure in her mother. There  is no history of Colon cancer, Pancreatic cancer, Rectal cancer, or Stomach cancer. She was adopted.  ROS:   Please see the history of present illness.    *** All other systems reviewed and are negative.  EKGs/Labs/Other Studies Reviewed:    The following studies were reviewed today: ***  EKG:  EKG ***  Recent Labs: 12/19/2021: NT-Pro BNP 612 02/26/2022: ALT 32; BUN 23; Creatinine 0.87; Hemoglobin 11.7; Platelet Count 159; Potassium 4.0; Sodium 137  Recent Lipid Panel    Component Value Date/Time   CHOL 106 09/06/2021 1018   CHOL 156 11/21/2016 1113   TRIG 101 09/06/2021 1018   HDL 50 09/06/2021 1018   HDL 44 11/21/2016 1113   CHOLHDL 2.1 09/06/2021 1018   VLDL 20 09/06/2021 1018   LDLCALC 36 09/06/2021 1018   LDLCALC 91 11/21/2016 1113   LDLDIRECT 98.0 05/24/2014 1343    Physical Exam:    VS:  There were no vitals taken for this visit.    Wt Readings from Last 3 Encounters:  03/07/22 173 lb 9.6 oz (78.7 kg)  02/27/22 173 lb 9.6 oz (78.7 kg)  02/26/22 173 lb 6.4 oz (78.7 kg)     GEN: ***. No acute distress HEENT: Normal NECK: No JVD. LYMPHATICS: No lymphadenopathy CARDIAC: *** murmur. RRR *** gallop, or edema. VASCULAR: *** Normal Pulses. No bruits. RESPIRATORY:  Clear to auscultation without rales, wheezing or rhonchi  ABDOMEN: Soft, non-tender,  non-distended, No pulsatile mass, MUSCULOSKELETAL: No deformity  SKIN: Warm and dry NEUROLOGIC:  Alert and oriented x 3 PSYCHIATRIC:  Normal affect   ASSESSMENT:    1. S/P TAVR (transcatheter aortic valve replacement)   2. Acute on chronic diastolic CHF (congestive heart failure) (HCC)   3. Paroxysmal atrial fibrillation ([place])   4. Coronary artery disease involving native coronary artery of native heart without angina pectoris   5. Sleep apnea, unspecified type   6. Left bundle branch block    PLAN:    In order of problems listed above:  ***   Medication Adjustments/Labs and Tests Ordered: Current medicines are reviewed at length with the patient today.  Concerns regarding medicines are outlined above.  No orders of the defined types were placed in this encounter.  No orders of the defined types were placed in this encounter.   There are no Patient Instructions on file for this visit.   Signed, Sinclair Grooms, MD  03/20/2022 4:25 PM    [facility]

## 2022-03-21 ENCOUNTER — Ambulatory Visit: Payer: Medicare Other | Admitting: Interventional Cardiology

## 2022-03-22 ENCOUNTER — Ambulatory Visit: Payer: Medicare Other | Admitting: Interventional Cardiology

## 2022-03-22 DIAGNOSIS — I2510 Atherosclerotic heart disease of native coronary artery without angina pectoris: Secondary | ICD-10-CM

## 2022-03-22 DIAGNOSIS — I480 Paroxysmal atrial fibrillation: Secondary | ICD-10-CM

## 2022-03-22 DIAGNOSIS — G4730 Sleep apnea, unspecified: Secondary | ICD-10-CM

## 2022-03-22 DIAGNOSIS — I447 Left bundle-branch block, unspecified: Secondary | ICD-10-CM

## 2022-03-22 DIAGNOSIS — Z952 Presence of prosthetic heart valve: Secondary | ICD-10-CM

## 2022-03-22 DIAGNOSIS — I5033 Acute on chronic diastolic (congestive) heart failure: Secondary | ICD-10-CM

## 2022-03-22 NOTE — Telephone Encounter (Signed)
Called and spoke to patient.  She understands she is scheduled for RUQ U/S on 12-18 at [facility].  I asked her to go to the lab for LFTs and AFP around the same time as U/S. She expressed understanding.

## 2022-03-23 DIAGNOSIS — N3941 Urge incontinence: Secondary | ICD-10-CM | POA: Diagnosis not present

## 2022-03-26 ENCOUNTER — Other Ambulatory Visit (HOSPITAL_BASED_OUTPATIENT_CLINIC_OR_DEPARTMENT_OTHER): Payer: Self-pay

## 2022-03-27 ENCOUNTER — Other Ambulatory Visit (HOSPITAL_BASED_OUTPATIENT_CLINIC_OR_DEPARTMENT_OTHER): Payer: Self-pay

## 2022-03-27 ENCOUNTER — Telehealth: Payer: Self-pay | Admitting: Pharmacist

## 2022-03-27 DIAGNOSIS — I639 Cerebral infarction, unspecified: Secondary | ICD-10-CM

## 2022-03-27 DIAGNOSIS — E785 Hyperlipidemia, unspecified: Secondary | ICD-10-CM

## 2022-03-27 DIAGNOSIS — I2510 Atherosclerotic heart disease of native coronary artery without angina pectoris: Secondary | ICD-10-CM

## 2022-03-27 MED ORDER — REPATHA SURECLICK 140 MG/ML ~~LOC~~ SOAJ
SUBCUTANEOUS | 3 refills | Status: DC
  Filled 2022-03-27: qty 6, fill #0
  Filled 2022-05-09: qty 6, 84d supply, fill #0
  Filled 2022-06-25: qty 6, 84d supply, fill #1

## 2022-03-27 NOTE — Progress Notes (Unsigned)
Cardiology Office Note:    Date:  03/28/2022   ID:  Breanna Webster, DOB 07-19-1945, MRN [id number]  PCP:  [name], NP  Cardiologist:  Sinclair Grooms, MD   Referring MD: [name], NP   Chief Complaint  Patient presents with   Congestive Heart Failure   Coronary Artery Disease   Cardiac Valve Problem   Atrial Fibrillation    History of Present Illness:    Breanna Webster is a 76 y.o. female with a hx of DM-2, Graves disease/hypothyroidism, NASH with cirrhosis, hypothyroidism,  Primary HTN, chronic diastolic HF, calcified mitral annulus, pulm HTN, CAD s/p PCI/DES x1 to mRCA (03/2018), mitral valve endocarditis 06/3352, PAF, embolic cerebellar CVA 08/6254, thalassemia, NASH cirrhosis, iron deficiency anemia, and severe AS now s/p TAVR (06/2018).    Shortness of breath with concern that diastolic heart failure is a component.  She has difficulty taking Lasix.  We have recommended SGLT2 therapy.  She has pushed back.  He is in a lot of pain.  Shots that she has taken for her knee and shoulder have not worked.  She still complains of dyspnea on exertion.  She has contemplated SGLT2 therapy but feels the cost is prohibitive.  Past Medical History:  Diagnosis Date   Anxiety    Arthritis    "back" (04/22/2018)   Back pain    Blood transfusion without reported diagnosis    CAD (coronary artery disease)    a. 03/2018 s/p PCI/DES to the RCA (3.0x15 Onyx DES).   Carotid artery stenosis    Mild   Chest pain    CHF (congestive heart failure) (HCC)    Chronic lower back pain    Cirrhosis (HCC)    Colon polyps    Diverticulitis    Diverticulosis    Esophageal thickening    seen on pre TAVR CT scan, also questionable cirrhosis. MRI recommended. Will refer to GI   Fatty liver    GERD (gastroesophageal reflux disease)    Grave's disease    Heart murmur    History of colonic polyps 05/22/2017   History of hiatal hernia    Hyperlipidemia    Hypertension     Hypothyroidism    IBS (irritable bowel syndrome)    Osteopenia    Parkinson's syndrome 04/23/2020   Pulmonary nodules    seen on pre TAVR CT. likley benign. no follow up recommended if pt low risk.   S/P TAVR (transcatheter aortic valve replacement)    Severe aortic stenosis    Shortness of breath on exertion    Stroke ([place])    Thalassemia minor    Thyroid disease    Type II diabetes mellitus ([place])     Past Surgical History:  Procedure Laterality Date   65 HOUR [place] STUDY N/A 03/03/2018   Procedure: 24 HOUR PH STUDY;  Surgeon: Mauri Pole, MD;  [facility];  Service: Endoscopy;  Laterality: N/A;   AORTIC VALVE REPLACEMENT  06/2018   COLONOSCOPY     COLONOSCOPY W/ BIOPSIES AND POLYPECTOMY     CORONARY ANGIOGRAPHY Right 04/21/2018   Procedure: CORONARY ANGIOGRAPHY (CATH LAB);  Surgeon: [name], MD;  Location: [place] CV LAB;  Service: Cardiovascular;  Laterality: Right;   CORONARY STENT INTERVENTION N/A 04/22/2018   Procedure: CORONARY STENT INTERVENTION;  Surgeon: [name], MD;  Location: [place] CV LAB;  Service: Cardiovascular;  Laterality: N/A;   DILATION AND CURETTAGE OF UTERUS     ESOPHAGEAL  MANOMETRY N/A 03/03/2018   Procedure: ESOPHAGEAL MANOMETRY (EM);  Surgeon: Mauri Pole, MD;  [facility];  Service: Endoscopy;  Laterality: N/A;   GASTRIC FUNDOPLICATION     HERNIA REPAIR     HYSTEROSCOPY     fibroids   LAPAROSCOPIC CHOLECYSTECTOMY     LAPAROSCOPY     fibroids   NISSEN FUNDOPLICATION  1610R   POLYPECTOMY     RIGHT/LEFT HEART CATH AND CORONARY ANGIOGRAPHY N/A 02/20/2018   Procedure: RIGHT/LEFT HEART CATH AND CORONARY ANGIOGRAPHY;  Surgeon: [name], MD;  Location: [place] CV LAB;  Service: Cardiovascular;  Laterality: N/A;   TEE WITHOUT CARDIOVERSION N/A 07/08/2018   Procedure: TRANSESOPHAGEAL ECHOCARDIOGRAM (TEE);  Surgeon: Burnell Blanks, MD;  Location: [place];  Service: Open Heart Surgery;   Laterality: N/A;   TEE WITHOUT CARDIOVERSION  10/07/2018   TEE WITHOUT CARDIOVERSION N/A 10/07/2018   Procedure: TRANSESOPHAGEAL ECHOCARDIOGRAM (TEE);  Surgeon: Jerline Pain, MD;  Location: [facility] ENDOSCOPY;  Service: Cardiovascular;  Laterality: N/A;   TONSILLECTOMY     TRANSCATHETER AORTIC VALVE REPLACEMENT, TRANSFEMORAL N/A 07/08/2018   Procedure: TRANSCATHETER AORTIC VALVE REPLACEMENT, TRANSFEMORAL;  Surgeon: Burnell Blanks, MD;  Location: [place];  Service: Open Heart Surgery;  Laterality: N/A;    Current Medications: Current Meds  Medication Sig   apixaban (ELIQUIS) 5 MG TABS tablet Take 1 tablet (5 mg total) by mouth 2 (two) times daily.   b complex vitamins capsule Take 1 capsule by mouth daily.   BD PEN NEEDLE NANO 2ND GEN 32G X 4 MM MISC    cholecalciferol (VITAMIN D) 25 MCG (1000 UNIT) tablet Take 1,000 Units by mouth daily.   Continuous Blood Gluc Receiver (FREESTYLE LIBRE READER) DEVI Use as directed   Continuous Blood Gluc Sensor (FREESTYLE LIBRE 3 SENSOR) MISC Place one sensor every 14 days   diltiazem (CARDIZEM CD) 120 MG 24 hr capsule Take 1 capsule (120 mg total) by mouth daily.   escitalopram (LEXAPRO) 20 MG tablet TAKE ONE TABLET BY MOUTH DAILY   Evolocumab (REPATHA SURECLICK) 604 MG/ML SOAJ Inject 1 dose into the skin every 14 (fourteen) days.   furosemide (LASIX) 20 MG tablet Take 2 tablets (2m) by mouth daily on 9/13, 9/14, and 9/15. Then take 1 tablet (230m by mouth daily. (Patient taking differently: Take 20 mg by mouth every other day. Take 2 tablets (4035mby mouth daily on 9/13, 9/14, and 9/15. Then take 1 tablet (87m64my mouth daily.)   glucose blood (ONETOUCH VERIO) test strip USE TO CHECK BLOOD SUGAR THREE TIMES DAILY   insulin NPH Human (HUMULIN N) 100 UNIT/ML injection Inject 20 units under the skin in the morning and 14 units in evening   Insulin NPH, Human,, Isophane, (HUMULIN N KWIKPEN) 100 UNIT/ML Kiwkpen Inject 20 units into the skin every  morning and 14 units every evening.   Insulin Pen Needle (UNIFINE PENTIPS) 32G X 4 MM MISC Inject 4 times subcutaneously   insulin regular (HUMULIN R) 100 units/mL injection Inject 10 Units total into the skin 3 (three) times daily before meals.   Insulin Regular Human (NOVOLIN R FLEXPEN RELION) 100 UNIT/ML KwikPen Inject 5 to 10 units under the skin three times daily.   Insulin Syringe-Needle U-100 (ULTICARE INSULIN SYRINGE) 31G X 5/16" 1 ML MISC Inject 4 times daily subcutaneously   Lancets (ONETOUCH DELICA PLUS [id number] Use twice daily   levothyroxine (SYNTHROID) 137 MCG tablet Take 1 tablet by mouth every morning on an empty stomach  BRAND NAME ONLY   losartan (COZAAR) 50 MG tablet TAKE ONE TABLET BY MOUTH DAILY   meclizine (ANTIVERT) 25 MG tablet Take 1 tablet (25 mg total) by mouth 3 (three) times daily as needed for dizziness.   metFORMIN (GLUCOPHAGE-XR) 500 MG 24 hr tablet Take 1 tablet by mouth 2 times daily   Multiple Vitamin (MULITIVITAMIN WITH MINERALS) TABS Take 1 tablet by mouth daily.   nitroGLYCERIN (NITROSTAT) 0.4 MG SL tablet Place 1 tablet (0.4 mg total) under the tongue every 5 (five) minutes as needed for chest pain.   NOVOLIN N 100 UNIT/ML injection Inject 28 Units into the skin daily before breakfast. Taking 20 units every morning and 8 units every evening   omeprazole (PRILOSEC) 20 MG capsule Take 1 capsule (20 mg total) by mouth 2 (two) times daily before a meal. (Patient taking differently: Take 20 mg by mouth daily.)   ondansetron (ZOFRAN) 4 MG tablet Take 1 tablet (4 mg total) by mouth every 8 (eight) hours as needed for nausea or vomiting.   potassium chloride SA (KLOR-CON M) 20 MEQ tablet Take 1 tablet (20 mEq total) by mouth daily as needed. Take with Furosemide (Lasix).   Vibegron (GEMTESA) 75 MG TABS Take 1 tablet by mouth daily.     Allergies:   Statins   Social History   Socioeconomic History   Marital status: Married    Spouse name: Not on file    Number of children: 2   Years of education: Not on file   Highest education level: Not on file  Occupational History   Occupation: Tree surgeon of the [employer]  Tobacco Use   Smoking status: Never    Passive exposure: Never   Smokeless tobacco: Never  Vaping Use   Vaping Use: Never used  Substance and Sexual Activity   Alcohol use: No    Alcohol/week: 0.0 standard drinks of alcohol   Drug use: Never   Sexual activity: Not Currently  Other Topics Concern   Not on file  Social History Narrative   Lives with husband in a one story home.     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   Education: college.   Social Determinants of Health   Financial Resource Strain: Medium Risk (11/13/2021)   Overall Financial Resource Strain (CARDIA)    Difficulty of Paying Living Expenses: Somewhat hard  Food Insecurity: No Food Insecurity (03/01/2022)   Hunger Vital Sign    Worried About Running Out of Food in the Last Year: Never true    Ran Out of Food in the Last Year: Never true  Transportation Needs: No Transportation Needs (03/01/2022)   PRAPARE - Hydrologist (Medical): No    Lack of Transportation (Non-Medical): No  Physical Activity: Inactive (04/03/2021)   Exercise Vital Sign    Days of Exercise per Week: 0 days    Minutes of Exercise per Session: 0 min  Stress: No Stress Concern Present (04/03/2021)   [place]    Feeling of Stress : Not at all  Social Connections: [place] (04/03/2021)   Social Connection and Isolation Panel [NHANES]    Frequency of Communication with Friends and Family: More than three times a week    Frequency of Social Gatherings with Friends and Family: More than three times a week    Attends Religious Services: More than 4  times per year    Active Member of Clubs or  Organizations: Yes    Attends Archivist Meetings: More than 4 times per year    Marital Status: Married     Family History: The patient's family history includes Headache in an other family member; Healthy in her son; Heart failure in her mother. There is no history of Colon cancer, Pancreatic cancer, Rectal cancer, or Stomach cancer. She was adopted.  ROS:   Please see the history of present illness.    Major complaints during the office visit concerning musculoskeletal pain in knee and shoulder.  All other systems reviewed and are negative.  EKGs/Labs/Other Studies Reviewed:    The following studies were reviewed today:  2D Doppler echocardiogram 2022: IMPRESSIONS   1. Left ventricular ejection fraction, by estimation, is 60 to 65%. The  left ventricle has normal function. The left ventricle has no regional  wall motion abnormalities. There is mild left ventricular hypertrophy.  Left ventricular diastolic parameters  are consistent with Grade I diastolic dysfunction (impaired relaxation).  The average left ventricular global longitudinal strain is -19.9 %. The  global longitudinal strain is normal.   2. Right ventricular systolic function is normal. The right ventricular  size is normal.   3. Left atrial size was moderately dilated.   4. The mitral valve is normal in structure. No evidence of mitral valve  regurgitation. Mild mitral stenosis. The mean mitral valve gradient is 4.5  mmHg. Severe mitral annular calcification.   5. The aortic valve has been repaired/replaced. Aortic valve  regurgitation is not visualized. No aortic stenosis is present. There is a  23 mm Edwards Sapien prosthetic (TAVR) valve present in the aortic  position. Echo findings are consistent with normal   structure and function of the aortic valve prosthesis. Aortic valve area,  by VTI measures 1.99 cm. Aortic valve mean gradient measures 15.0 mmHg.  Aortic valve Vmax measures 2.43 m/s.   6.  The inferior vena cava is normal in size with greater than 50%  respiratory variability, suggesting right atrial pressure of 3 mmHg.   Comparison(s): No significant change from prior study. Prior images  reviewed side by side.   EKG:  EKG none  Recent Labs: 12/19/2021: NT-Pro BNP 612 02/26/2022: ALT 32; BUN 23; Creatinine 0.87; Hemoglobin 11.7; Platelet Count 159; Potassium 4.0; Sodium 137  Recent Lipid Panel    Component Value Date/Time   CHOL 106 09/06/2021 1018   CHOL 156 11/21/2016 1113   TRIG 101 09/06/2021 1018   HDL 50 09/06/2021 1018   HDL 44 11/21/2016 1113   CHOLHDL 2.1 09/06/2021 1018   VLDL 20 09/06/2021 1018   LDLCALC 36 09/06/2021 1018   LDLCALC 91 11/21/2016 1113   LDLDIRECT 98.0 05/24/2014 1343    Physical Exam:    VS:  BP 128/60   Pulse 70   Ht 5' 5"  (1.651 m)   Wt 165 lb 3.2 oz (74.9 kg)   SpO2 98%   BMI 27.49 kg/m     Wt Readings from Last 3 Encounters:  03/28/22 165 lb 3.2 oz (74.9 kg)  03/07/22 173 lb 9.6 oz (78.7 kg)  02/27/22 173 lb 9.6 oz (78.7 kg)     GEN: Veins are not distended. No acute distress HEENT: Normal NECK: No JVD. LYMPHATICS: No lymphadenopathy CARDIAC: 2/6 systolic murmur. RRR no gallop, or edema. VASCULAR:  Normal Pulses. No bruits. RESPIRATORY:  Clear to auscultation without rales, wheezing or rhonchi  ABDOMEN: Soft, non-tender,  non-distended, No pulsatile mass, MUSCULOSKELETAL: No deformity  SKIN: Warm and dry NEUROLOGIC:  Alert and oriented x 3 PSYCHIATRIC:  Normal affect   ASSESSMENT:    1. Coronary artery disease involving native coronary artery of native heart without angina pectoris   2. Paroxysmal atrial fibrillation (HCC)   3. Chronic heart failure with preserved ejection fraction ([place])   4. S/P TAVR (transcatheter aortic valve replacement)   5. Type 2 diabetes mellitus with complication, with long-term current use of insulin ([place])   6. Dyslipidemia   7. Left bundle branch block   8. Hypertension,  unspecified type    PLAN:    In order of problems listed above:  Continue secondary prevention with lipid lowering and blood pressure control. Continue Eliquis therapy. Start SGLT2 therapy.  She is on such a complicated regimen at this point I recommend that she see Dr. Chalmers Cater at [facility] to determine if it would be safe and also help decrease or eliminate one of her other agents. Follow-up of her chronic valve disease and coronary disease with one of the younger cardiologist here at [facility] heart care.  That appointment should occur in about 3 to 4 months.  Will need longitudinal cardiology follow-up with [facility]  Medication Adjustments/Labs and Tests Ordered: Current medicines are reviewed at length with the patient today.  Concerns regarding medicines are outlined above.  No orders of the defined types were placed in this encounter.  No orders of the defined types were placed in this encounter.   There are no Patient Instructions on file for this visit.   Signed, Sinclair Grooms, MD  03/28/2022 1:58 PM    [facility]

## 2022-03-27 NOTE — Telephone Encounter (Signed)
PA for [place] submitted.  KeyLorelee Cover. PA approved through 03/27/23

## 2022-03-28 ENCOUNTER — Encounter: Payer: Self-pay | Admitting: Interventional Cardiology

## 2022-03-28 ENCOUNTER — Encounter: Payer: Self-pay | Admitting: Hematology

## 2022-03-28 ENCOUNTER — Ambulatory Visit: Payer: Medicare Other | Attending: Interventional Cardiology | Admitting: Interventional Cardiology

## 2022-03-28 VITALS — BP 128/60 | HR 70 | Ht 65.0 in | Wt 165.2 lb

## 2022-03-28 DIAGNOSIS — I5032 Chronic diastolic (congestive) heart failure: Secondary | ICD-10-CM | POA: Diagnosis not present

## 2022-03-28 DIAGNOSIS — E785 Hyperlipidemia, unspecified: Secondary | ICD-10-CM | POA: Diagnosis not present

## 2022-03-28 DIAGNOSIS — I639 Cerebral infarction, unspecified: Secondary | ICD-10-CM

## 2022-03-28 DIAGNOSIS — I10 Essential (primary) hypertension: Secondary | ICD-10-CM | POA: Diagnosis not present

## 2022-03-28 DIAGNOSIS — E118 Type 2 diabetes mellitus with unspecified complications: Secondary | ICD-10-CM

## 2022-03-28 DIAGNOSIS — I2510 Atherosclerotic heart disease of native coronary artery without angina pectoris: Secondary | ICD-10-CM | POA: Diagnosis not present

## 2022-03-28 DIAGNOSIS — I480 Paroxysmal atrial fibrillation: Secondary | ICD-10-CM

## 2022-03-28 DIAGNOSIS — Z952 Presence of prosthetic heart valve: Secondary | ICD-10-CM

## 2022-03-28 DIAGNOSIS — Z794 Long term (current) use of insulin: Secondary | ICD-10-CM | POA: Diagnosis not present

## 2022-03-28 DIAGNOSIS — I447 Left bundle-branch block, unspecified: Secondary | ICD-10-CM | POA: Diagnosis not present

## 2022-03-28 NOTE — Patient Instructions (Addendum)
Medication Instructions:  Your physician recommends that you continue on your current medications as directed. Please refer to the Current Medication list given to you today.  *If you need a refill on your cardiac medications before your next appointment, please call your pharmacy*  Follow-Up: At [facility], you and your health needs are our priority.  As part of our continuing mission to provide you with exceptional heart care, we have created designated Provider Care Teams.  These Care Teams include your primary Cardiologist (physician) and Advanced Practice Providers (APPs -  Physician Assistants and Nurse Practitioners) who all work together to provide you with the care you need, when you need it.  Your next appointment:   3-4 month(s)  The format for your next appointment:   In Person  Provider:   Candee Furbish, MD or Rudean Haskell, MD or Gwyndolyn Kaufman, MD  Other Instructions Your physician has referred you to endocrinologist Dr. Jacelyn Pi at [facility]. Their office is located at [address], [place], [place] [address]. Their phone number is [phone], their fax number is [phone]. A referral has been placed, please allow 2-4 weeks before hearing from Dr. Almetta Lovely office to schedule an appointment.  Important Information About Sugar

## 2022-04-02 ENCOUNTER — Other Ambulatory Visit (HOSPITAL_BASED_OUTPATIENT_CLINIC_OR_DEPARTMENT_OTHER): Payer: Self-pay

## 2022-04-03 ENCOUNTER — Telehealth: Payer: Self-pay | Admitting: Pharmacist

## 2022-04-03 ENCOUNTER — Other Ambulatory Visit (HOSPITAL_BASED_OUTPATIENT_CLINIC_OR_DEPARTMENT_OTHER): Payer: Self-pay

## 2022-04-03 ENCOUNTER — Encounter: Payer: Self-pay | Admitting: Hematology

## 2022-04-03 DIAGNOSIS — M19012 Primary osteoarthritis, left shoulder: Secondary | ICD-10-CM | POA: Diagnosis not present

## 2022-04-03 DIAGNOSIS — M25512 Pain in left shoulder: Secondary | ICD-10-CM | POA: Diagnosis not present

## 2022-04-03 DIAGNOSIS — M1711 Unilateral primary osteoarthritis, right knee: Secondary | ICD-10-CM | POA: Diagnosis not present

## 2022-04-03 MED ORDER — HYDROCODONE-ACETAMINOPHEN 5-325 MG PO TABS
1.0000 | ORAL_TABLET | Freq: Four times a day (QID) | ORAL | 0 refills | Status: DC
  Filled 2022-04-03: qty 60, 15d supply, fill #0

## 2022-04-03 MED ORDER — METHOCARBAMOL 500 MG PO TABS
500.0000 mg | ORAL_TABLET | Freq: Four times a day (QID) | ORAL | 0 refills | Status: DC
  Filled 2022-04-03: qty 60, 15d supply, fill #0

## 2022-04-03 NOTE — Chronic Care Management (AMB) (Signed)
Chronic Care Management Pharmacy Assistant   Name: Breanna Webster  MRN: [id number] DOB: 09-Sep-1945   04/03/22 APPOINTMENT REMINDER   Called Patient No answer, left message of appointment on 04/04/22 at 10 via telephone visit with [name], Pharm D.   Notified to have all medications, supplements, blood pressure and/or blood sugar logs available during appointment and to return call if need to reschedule.     Care Gaps: Diabetic Urine - Overdue Foot Exam - Overdue HGB A2C - Overdue Eye Exam - Overdue Flu Vaccine - Overdue COVID Booster - Overdue AWV- 04/03/21 Lab Results  Component Value Date   HGBA1C 7.4 (H) 09/08/2020    Star Rating Drug: Losartan 50 mg - last filled 02/10/2022 90 DS at [place] Metformin 500 mg - last filled 10/212023 90 DS at [place]      Medications: Outpatient Encounter Medications as of 04/03/2022  Medication Sig   apixaban (ELIQUIS) 5 MG TABS tablet Take 1 tablet (5 mg total) by mouth 2 (two) times daily.   b complex vitamins capsule Take 1 capsule by mouth daily.   BD PEN NEEDLE NANO 2ND GEN 32G X 4 MM MISC    cholecalciferol (VITAMIN D) 25 MCG (1000 UNIT) tablet Take 1,000 Units by mouth daily.   Continuous Blood Gluc Receiver (FREESTYLE LIBRE READER) DEVI Use as directed   Continuous Blood Gluc Sensor (FREESTYLE LIBRE 3 SENSOR) MISC Place one sensor every 14 days   diltiazem (CARDIZEM CD) 120 MG 24 hr capsule Take 1 capsule (120 mg total) by mouth daily.   escitalopram (LEXAPRO) 20 MG tablet TAKE ONE TABLET BY MOUTH DAILY   Evolocumab (REPATHA SURECLICK) 778 MG/ML SOAJ Inject 1 dose into the skin every 14 (fourteen) days.   furosemide (LASIX) 20 MG tablet Take 2 tablets (4m) by mouth daily on 9/13, 9/14, and 9/15. Then take 1 tablet (254m by mouth daily. (Patient taking differently: Take 20 mg by mouth every other day. Take 2 tablets (4062mby mouth daily on 9/13, 9/14, and 9/15. Then take 1 tablet (53m65my mouth  daily.)   glucose blood (ONETOUCH VERIO) test strip USE TO CHECK BLOOD SUGAR THREE TIMES DAILY   HYDROcodone-acetaminophen (NORCO) 5-325 MG tablet Take 1 tablet by mouth every 6 (six) hours.   insulin NPH Human (HUMULIN N) 100 UNIT/ML injection Inject 20 units under the skin in the morning and 14 units in evening   Insulin NPH, Human,, Isophane, (HUMULIN N KWIKPEN) 100 UNIT/ML Kiwkpen Inject 20 units into the skin every morning and 14 units every evening.   Insulin Pen Needle (UNIFINE PENTIPS) 32G X 4 MM MISC Inject 4 times subcutaneously   insulin regular (HUMULIN R) 100 units/mL injection Inject 10 Units total into the skin 3 (three) times daily before meals.   Insulin Regular Human (NOVOLIN R FLEXPEN RELION) 100 UNIT/ML KwikPen Inject 5 to 10 units under the skin three times daily.   Insulin Syringe-Needle U-100 (ULTICARE INSULIN SYRINGE) 31G X 5/16" 1 ML MISC Inject 4 times daily subcutaneously   Lancets (ONETOUCH DELICA PLUS [id number] Use twice daily   levothyroxine (SYNTHROID) 137 MCG tablet Take 1 tablet by mouth every morning on an empty stomach  BRAND NAME ONLY   losartan (COZAAR) 50 MG tablet TAKE ONE TABLET BY MOUTH DAILY   meclizine (ANTIVERT) 25 MG tablet Take 1 tablet (25 mg total) by mouth 3 (three) times daily as needed for dizziness.   metFORMIN (GLUCOPHAGE-XR) 500 MG 24 hr tablet Take  1 tablet by mouth 2 times daily   methocarbamol (ROBAXIN) 500 MG tablet Take 1 tablet (500 mg total) by mouth 4 (four) times daily.   Multiple Vitamin (MULITIVITAMIN WITH MINERALS) TABS Take 1 tablet by mouth daily.   nitroGLYCERIN (NITROSTAT) 0.4 MG SL tablet Place 1 tablet (0.4 mg total) under the tongue every 5 (five) minutes as needed for chest pain.   NOVOLIN N 100 UNIT/ML injection Inject 28 Units into the skin daily before breakfast. Taking 20 units every morning and 8 units every evening   omeprazole (PRILOSEC) 20 MG capsule Take 1 capsule (20 mg total) by mouth 2 (two) times daily  before a meal. (Patient taking differently: Take 20 mg by mouth daily.)   ondansetron (ZOFRAN) 4 MG tablet Take 1 tablet (4 mg total) by mouth every 8 (eight) hours as needed for nausea or vomiting.   potassium chloride SA (KLOR-CON M) 20 MEQ tablet Take 1 tablet (20 mEq total) by mouth daily as needed. Take with Furosemide (Lasix).   Vibegron (GEMTESA) 75 MG TABS Take 1 tablet by mouth daily.   No facility-administered encounter medications on file as of 04/03/2022.       [facility] Clinical Pharmacist Assistant [phone]

## 2022-04-04 ENCOUNTER — Ambulatory Visit (INDEPENDENT_AMBULATORY_CARE_PROVIDER_SITE_OTHER): Payer: Medicare Other | Admitting: Pharmacist

## 2022-04-04 ENCOUNTER — Telehealth: Payer: Self-pay | Admitting: *Deleted

## 2022-04-04 ENCOUNTER — Other Ambulatory Visit (HOSPITAL_BASED_OUTPATIENT_CLINIC_OR_DEPARTMENT_OTHER): Payer: Self-pay

## 2022-04-04 DIAGNOSIS — I10 Essential (primary) hypertension: Secondary | ICD-10-CM

## 2022-04-04 DIAGNOSIS — Z794 Long term (current) use of insulin: Secondary | ICD-10-CM

## 2022-04-04 NOTE — Progress Notes (Signed)
Chronic Care Management Pharmacy Note  04/06/2022 Name:  CLARISSE RODRIGES MRN:  [id number] DOB:  Aug 04, 1945  Summary: A1c is at goal < 7% but recent home readings have been higher   Recommendations/Changes made from today's visit: -Recommended discussing with endo about starting a CGM and Ozempic -Recommended repeat DEXA  Plan: Follow up BP/DM assessment in 2 months Follow up in 4 months   Subjective: Breanna Webster is an 76 y.o. year old female who is a primary patient of [name], NP.  The CCM team was consulted for assistance with disease management and care coordination needs.    Engaged with patient by telephone for follow up visit in response to provider referral for pharmacy case management and/or care coordination services.   Consent to Services:  The patient was given information about Chronic Care Management services, agreed to services, and gave verbal consent prior to initiation of services.  Please see initial visit note for detailed documentation.   Patient Care Team: [name], NP as PCP - General (Family Medicine) [name], MD as PCP - Cardiology (Cardiology) Madelin Rear, MD as Consulting Physician (Endocrinology) Garvin Fila, MD as Referring Physician (Neurology) Viona Gilmore, [facility] as Pharmacist (Pharmacist)  Recent office visits: 02/06/22 [name], NP - Patient presented for shoulder and knee pain.  Prescribed tramadol PRN.  12/20/21 [name], NP - Patient presented via video for Nasal congestion and other concerns. Prescribed Molnupiravir.    11/20/21 [name], RN - Patient presented for Nurse CCM Visit. No medication changes.   11/16/21 [name], NP - Patient presented for Frequent urination and other concerns. No medication changes.   11/07/21 [name], [name], MD - Patient presented for Dysuria and other concerns. Prescribed Macrobid.  10/12/21 [name], NP: Patient presented for annual  exam. Prescribed furosemide 40 mg daily x 3 days.  Recent consult visits: 03/28/22 Daneen Schick, MD (cardiology): Patient presented for CHF follow up. Recommended SGLT2 therapy and discussion with endo about it. Recommended follow up with cardiology in 3-4 months.   03/14/22 Eilene Ghazi ([place]): Patient presented for DM follow up. Increased Humulin to 20 units in AM and 14 units in PM.   02/27/22 Jayme Cloud, RN (endoscopy center): Patient presented for EGD and colonoscopy prep.  02/26/22 Fabienne Bruns, MD (hem/onc): Patient presented for iron deficiency anemia. Follow up in 12 months.  02/09/22 Richardson Dopp, PA-C (cardiology): Patient presented for CHF follow up. Recommend SGLT2 inhibitor and pt to discuss with endo. Follow up in 3 months.  01/11/22 Camas Cellar, MD (gastro): Patient presented for altered bowel habits.  Needs EGD and colonoscopy. Plan to get cardiac clearance.  01/02/22 Daneen Schick, MD (cardiology): Patient presented for CHF follow up. Recommended Lasix 20 mg daily. Follow up BMET in 1 week.  01/02/22 [name], MD(Cardiology) - Patient presented for Acute on CHF and other concerns. Changed Furosemide.   01/11/22 [name], [name], MD [name]) - Patient presented for Altered bowel habits and other concerns. Prescribed Dicyclomine HCL. Prescribed Loperamide HCL. Stopped Vibegron 75 mg.   12/19/21 [name], MD (Cardiology) - Patient presented for Acute on chronic diastolic CHF and other concerns. Changed Furosemide 40 mg. Changed Isophane. Increased Insulin Regular Human. Increased Levothyroxine.    12/04/21 [name], PT - Patient presented for Other abnormalities of gait and mobility and other concerns. No medication changes.   11/27/21 [name], PT - Patient presented for Other abnormalities of gait and mobility and  other concerns. No medication changes.   11/20/21 [name], MD (Cardiology) - Patient presented for Chronic  diastolic heart failure and other concerns. Stopped Estradiol. Stopped Furosemide.   11/15/21 [name], [name], MD - Patient presented  to [facility] for Osteoarthritis of right knee joint. No medication changes.    10/30/21 [name], PT (outpatient rehab): Patient presented for PT treatment for right knee.  10/25/21 Larey Seat, MD (neurology): Patient presented for stroke follow up. Patient did not tolerate Sinemet and switching to Requip 0.25 mg TID. Follow up in 5-6 months.   Hospital visits: None in previous 6 months.   Objective:  Lab Results  Component Value Date   CREATININE 0.87 02/26/2022   BUN 23 02/26/2022   GFR 79.08 09/22/2021   GFRNONAA >60 02/26/2022   GFRAA 99 12/05/2020   NA 137 02/26/2022   K 4.0 02/26/2022   CALCIUM 11.0 (H) 02/26/2022   CO2 24 02/26/2022   GLUCOSE 137 (H) 02/26/2022    Lab Results  Component Value Date/Time   HGBA1C 7.4 (H) 09/08/2020 04:16 AM   HGBA1C 6.5 05/05/2020 12:00 AM   HGBA1C 7.5 (H) 10/22/2018 04:45 AM   FRUCTOSAMINE 373 (H) 03/08/2016 11:43 AM   GFR 79.08 09/22/2021 10:56 AM   GFR 75.63 03/13/2021 11:54 AM   MICROALBUR 0.8 10/07/2015 01:30 PM   MICROALBUR 1.1 02/08/2014 02:49 PM    Last diabetic Eye exam:  Lab Results  Component Value Date/Time   HMDIABEYEEXA Retinopathy (A) 08/29/2020 12:00 AM    Last diabetic Foot exam: No results found for: "HMDIABFOOTEX"   Lab Results  Component Value Date   CHOL 106 09/06/2021   HDL 50 09/06/2021   LDLCALC 36 09/06/2021   LDLDIRECT 98.0 05/24/2014   TRIG 101 09/06/2021   CHOLHDL 2.1 09/06/2021       Latest Ref Rng & Units 02/26/2022   10:37 AM 09/22/2021   10:56 AM 09/06/2021   10:05 AM  Hepatic Function  Total Protein 6.5 - 8.1 g/dL 7.7  7.2  7.1   Albumin 3.5 - 5.0 g/dL 4.5  4.1  4.1   AST 15 - 41 U/L _0 ALT 0 - 44 U/L 32  14  31   Alk Phosphatase 38 - 126 U/L 86  93  96   Total Bilirubin 0.3 - 1.2 mg/dL 1.2  1.0  0.9     Lab Results   Component Value Date/Time   TSH 1.44 12/05/2020 12:00 AM   TSH 1.47 05/05/2020 12:00 AM   TSH 5.935 (H) 04/30/2019 11:49 AM   TSH 9.460 (H) 11/12/2018 09:39 AM   TSH 0.322 (L) 10/21/2018 03:30 PM   TSH 0.79 08/05/2017 11:52 AM   FREET4 0.89 04/30/2019 11:49 AM   FREET4 1.78 (H) 10/21/2018 03:30 PM       Latest Ref Rng & Units 02/26/2022   10:37 AM 02/06/2022   12:17 PM 09/22/2021   10:56 AM  CBC  WBC 4.0 - 10.5 K/uL 7.5  7.3  6.8   Hemoglobin 12.0 - 15.0 g/dL 11.7  11.0  10.9   Hematocrit 36.0 - 46.0 % 36.9  35.3  34.9   Platelets 150 - 400 K/uL 159  202.0  173.0     Lab Results  Component Value Date/Time   VD25OH 34.2 02/26/2017 11:16 AM   VD25OH 28.0 (L) 11/21/2016 11:13 AM   VD25OH 20.86 (L) 05/29/2016 11:16 AM    Clinical ASCVD: Yes  The ASCVD  Risk score (Arnett DK, et al., 2019) failed to calculate for the following reasons:   The patient has a prior MI or stroke diagnosis       04/03/2021    1:29 PM 10/03/2020    1:18 PM 03/14/2020   11:39 AM  Depression screen PHQ 2/9  Decreased Interest 0 0 0  Down, Depressed, Hopeless 0 0 0  PHQ - 2 Score 0 0 0  Altered sleeping   0  Tired, decreased energy   0  Change in appetite   0  Feeling bad or failure about yourself    0  Trouble concentrating   0  Moving slowly or fidgety/restless   0  Suicidal thoughts   0  PHQ-9 Score   0  Difficult doing work/chores   Not difficult at all   CHA2DS2/VAS Stroke Risk Points  Current as of 15 minutes ago     9 >= 2 Points: High Risk  1 - 1.99 Points: Medium Risk  0 Points: Low Risk    Last Change: N/A      Details    This score determines the patient's risk of having a stroke if the  patient has atrial fibrillation.       Points Metrics  1 Has Congestive Heart Failure:  Yes    Current as of 15 minutes ago  1 Has Vascular Disease:  Yes    Current as of 15 minutes ago  1 Has Hypertension:  Yes    Current as of 15 minutes ago  2 Age:  29    Current as of 15 minutes ago   1 Has Diabetes:  Yes    Current as of 15 minutes ago  2 Had Stroke:  Yes  Had TIA:  No  Had Thromboembolism:  No    Current as of 15 minutes ago  1 Female:  Yes    Current as of 15 minutes ago            Social History   Tobacco Use  Smoking Status Never   Passive exposure: Never  Smokeless Tobacco Never   BP Readings from Last 3 Encounters:  03/28/22 128/60  03/07/22 (!) 152/60  02/26/22 (!) 124/56   Pulse Readings from Last 3 Encounters:  03/28/22 70  03/07/22 70  02/26/22 77   Wt Readings from Last 3 Encounters:  03/28/22 165 lb 3.2 oz (74.9 kg)  03/07/22 173 lb 9.6 oz (78.7 kg)  02/27/22 173 lb 9.6 oz (78.7 kg)   BMI Readings from Last 3 Encounters:  03/28/22 27.49 kg/m  03/07/22 28.89 kg/m  02/27/22 28.89 kg/m    Assessment/Interventions: Review of patient past medical history, allergies, medications, health status, including review of consultants reports, laboratory and other test data, was performed as part of comprehensive evaluation and provision of chronic care management services.   SDOH:  (Social Determinants of Health) assessments and interventions performed: Yes  (last 11/06/21) SDOH Interventions    Flowsheet Row Telephone from 03/01/2022 in [place] Management from 11/06/2021 in [place] at [place] Management from 02/20/2021 in [place] at [place] Management from 10/03/2020 in [place] at [place] from 03/14/2020 in [place] at [place] Management from 10/29/2019 in [place] at [place] Interventions Intervention Not Indicated -- -- Intervention Not Indicated -- --  Housing Interventions Intervention Not Indicated -- --  Intervention Not Indicated Intervention Not Indicated --  Transportation Interventions Intervention Not Indicated --  Intervention Not Indicated Other (Comment)  [Care to give resources if needs change] Intervention Not Indicated Intervention Not Indicated  Utilities Interventions Intervention Not Indicated -- -- -- -- --  Depression Interventions/Treatment  -- -- -- -- PHQ2-9 Score <4 Follow-up Not Indicated --  Financial Strain Interventions -- Other (Comment)  [working on coverage options for GLP1] Intervention Not Indicated -- Intervention Not Indicated --  Physical Activity Interventions -- -- -- -- Intervention Not Indicated --  Stress Interventions -- -- -- Patient Refused  [Declines LCSW referral] -- --  Social Connections Interventions -- -- -- -- Intervention Not Indicated --       SDOH Screenings   Food Insecurity: No Food Insecurity (03/01/2022)  Housing: Low Risk  (03/01/2022)  Transportation Needs: Unknown (03/01/2022)  Utilities: Not At Risk (03/01/2022)  Alcohol Screen: Low Risk  (03/14/2020)  Depression (PHQ2-9): Low Risk  (04/03/2021)  Financial Resource Strain: Medium Risk (11/13/2021)  Physical Activity: Inactive (04/03/2021)  Social Connections: Socially Integrated (04/03/2021)  Stress: No Stress Concern Present (04/03/2021)  Tobacco Use: Low Risk  (03/28/2022)    Rote  Allergies  Allergen Reactions   Statins Other (See Comments)    Muscle aches    Medications Reviewed Today     Reviewed by Viona Gilmore, [facility] (Pharmacist) on 04/04/22 at [place] List Status: <None>   Medication Order Taking? Sig Documenting Provider Last Dose Status Informant  apixaban (ELIQUIS) 5 MG TABS tablet [id number]  Take 1 tablet (5 mg total) by mouth 2 (two) times daily. [name], MD  Active   b complex vitamins capsule [id number]  Take 1 capsule by mouth daily. [provider]  Active   BD PEN NEEDLE NANO 2ND GEN 32G X 4 MM MISC [id number]   [provider]  Active   cholecalciferol (VITAMIN D) 25 MCG (1000 UNIT) tablet [id number]  Take 1,000 Units by mouth daily.  [provider]  Active Self  Continuous Blood Gluc Receiver (FREESTYLE LIBRE READER) DEVI [id number]  Use as directed   Active   Continuous Blood Gluc Sensor (FREESTYLE LIBRE 3 SENSOR) MISC [id number]  Place one sensor every 14 days   Active   diltiazem (CARDIZEM CD) 120 MG 24 hr capsule [id number]  Take 1 capsule (120 mg total) by mouth daily. [name], MD  Active   escitalopram (LEXAPRO) 20 MG tablet [id number]  TAKE ONE TABLET BY MOUTH DAILY Nafziger, Tommi Rumps, NP  Active   Evolocumab (REPATHA SURECLICK) 048 MG/ML SOAJ [id number]  Inject 1 dose into the skin every 14 (fourteen) days. [name], MD  Active   furosemide (LASIX) 20 MG tablet [id number]  Take 2 tablets (49m) by mouth daily on 9/13, 9/14, and 9/15. Then take 1 tablet (246m by mouth daily.  Patient taking differently: Take 20 mg by mouth every other day. Take 2 tablets (4079mby mouth daily on 9/13, 9/14, and 9/15. Then take 1 tablet (89m28my mouth daily.   SmitBelva Crome  Active   glucose blood ([facility]) test strip [id number] TO CHECK BLOOD SUGAR THREE TIMES DAILY   Active   HYDROcodone-acetaminophen (NORCO) 5-325 MG tablet [id number] Take 1 tablet by mouth every 6 (six) hours.  Taking Active   insulin NPH Human (HUMULIN N) 100 UNIT/ML injection [id number] 20 units under the skin in the morning and 14 units in evening  Active   Insulin NPH, Human,, Isophane, (HUMULIN N KWIKPEN) 100 UNIT/ML Mayer Masker [id number]  Inject 20 units into the skin every morning and 14 units every evening.   Active   Insulin Pen Needle (UNIFINE PENTIPS) 32G X 4 MM MISC [id number]  Inject 4 times subcutaneously   Active   insulin regular (HUMULIN R) 100 units/mL injection [id number]  Inject 10 Units total into the skin 3 (three) times daily before meals.   Active   Insulin Regular Human (NOVOLIN R FLEXPEN RELION) 100 UNIT/ML KwikPen [id number]  Inject 5 to 10 units under the skin three times daily.   Active   Insulin  Syringe-Needle U-100 (ULTICARE INSULIN SYRINGE) 31G X 5/16" 1 ML MISC [id number]  Inject 4 times daily subcutaneously [name], NP  Active   Lancets [facility] DELICA PLUS [id number]) [place] [id number]  Use twice daily   Active   levothyroxine (SYNTHROID) 137 MCG tablet [id number]  Take 1 tablet by mouth every morning on an empty stomach  BRAND NAME ONLY   Active   losartan (COZAAR) 50 MG tablet [id number]  TAKE ONE TABLET BY MOUTH DAILY [name], MD  Active   meclizine (ANTIVERT) 25 MG tablet [id number]  Take 1 tablet (25 mg total) by mouth 3 (three) times daily as needed for dizziness. Nafziger, Tommi Rumps, NP  Active   metFORMIN (GLUCOPHAGE-XR) 500 MG 24 hr tablet [id number]  Take 1 tablet by mouth 2 times daily   Active   methocarbamol (ROBAXIN) 500 MG tablet [id number] No Take 1 tablet (500 mg total) by mouth 4 (four) times daily.  Patient not taking: Reported on 04/04/2022    Not Taking Active   Multiple Vitamin (MULITIVITAMIN WITH MINERALS) TABS [id number]  Take 1 tablet by mouth daily. [provider]  Active Self  nitroGLYCERIN (NITROSTAT) 0.4 MG SL tablet [id number]  Place 1 tablet (0.4 mg total) under the tongue every 5 (five) minutes as needed for chest pain. [name], MD  Active   NOVOLIN N 100 UNIT/ML injection [id number]  Inject 28 Units into the skin daily before breakfast. Taking 20 units every morning and 8 units every evening [provider]  Active Self  omeprazole (PRILOSEC) 20 MG capsule [id number]  Take 1 capsule (20 mg total) by mouth 2 (two) times daily before a meal.  Patient taking differently: Take 20 mg by mouth daily.   [name], [name], PA-C  Active   ondansetron (ZOFRAN) 4 MG tablet [id number]  Take 1 tablet (4 mg total) by mouth every 8 (eight) hours as needed for nausea or vomiting. Nafziger, Tommi Rumps, NP  Active   potassium chloride SA (KLOR-CON M) 20 MEQ tablet [id number]  Take 1 tablet (20 mEq total) by mouth daily as needed. Take with Furosemide (Lasix).  [name], MD  Active   Vibegron [facility]) 75 MG TABS [id number]  Take 1 tablet by mouth daily.   Active             Patient Active Problem List   Diagnosis Date Noted   Preoperative cardiovascular examination 02/09/2022   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[place]) 10/25/2021   Left hemiparesis ([place]) 06/21/2021   Other specified hereditary hemolytic anemias ([place]) 06/21/2021   Parkinsonism 06/21/2021   COVID-19 02/14/2021   Postviral fatigue syndrome 02/14/2021   Difficulty with adaptive servo-ventilation (ASV) use 02/14/2021   Sepsis secondary to UTI ([place]) 09/08/2020   Elevated ALT measurement 09/08/2020   History of CVA with residual deficit 09/08/2020   Hyperbilirubinemia 09/08/2020  Fatigue associated with anemia 08/03/2020   Cerebrovascular accident (CVA) due to embolism of right posterior cerebral artery ([place]) 08/03/2020   OSA treated with BiPAP 08/03/2020   Complex sleep apnea syndrome 08/03/2020   Treatment-emergent central sleep apnea 08/03/2020   Chronic intermittent hypoxia with obstructive sleep apnea 04/21/2020   OSA (obstructive sleep apnea) 04/21/2020   History of cardioembolic stroke 73/53/2992   Gait disturbance, post-stroke 03/29/2020   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 ([place]) 03/29/2020   Anxiety    RLQ abdominal pain 10/22/2019   Paroxysmal atrial fibrillation ([place]) 05/22/2019   Iron deficiency anemia 05/07/2019   Atrial fibrillation with RVR ([place]) 10/21/2018   Cerebellar stroke, acute ([place]) 10/21/2018   Streptococcal endocarditis    Endocarditis of mitral valve 10/07/2018   Bacteremia due to Streptococcus Salivarius 10/07/2018   Sepsis ([place]) 42/68/3419   Acute metabolic encephalopathy 62/22/9798   Severe aortic stenosis 07/08/2018   S/P TAVR (transcatheter aortic valve replacement) 07/08/2018   Esophageal thickening    Coronary artery disease involving native coronary artery of native heart without angina pectoris 04/22/2018    Gastroesophageal reflux disease    Pulmonary hypertension ([place]) 02/21/2018   Essential hypertension 07/15/2017   History of colonic polyps 05/22/2017   Elevated liver function tests 12/05/2016   DOE (dyspnea on exertion) 07/19/2016   Thalassemia minor 05/29/2016   Left bundle branch block 12/06/2015   Upper airway cough syndrome 10/14/2015   (HFpEF) heart failure with preserved ejection fraction ([place]) 10/10/2015   Myalgia 02/17/2014   Carotid artery stenosis 06/05/2013   Eustachian tube dysfunction 05/07/2013   Neuropathy of leg 03/07/2012   Anemia, unspecified 01/31/2012   Hot flashes 08/24/2010   Diabetes mellitus due to underlying condition, uncontrolled 06/30/2010   Generalized abdominal pain 03/24/2010   Obesity (BMI 30.0-34.9) 12/21/2009   IBS (irritable bowel syndrome) 04/29/2009   Vitamin D deficiency 03/10/2009   Acute bronchitis 03/07/2009   Hypothyroidism 12/13/2008   Dyslipidemia 12/13/2008   Anxiety state 12/13/2008   Other specified disorders of bladder 12/13/2008    Immunization History  Administered Date(s) Administered   Fluad Quad(high Dose 65+) 01/20/2019, 01/06/2020   Hep A / Hep B 06/15/2015, 07/15/2015, 12/27/2015   Influenza Split 01/16/2012   Influenza Whole 01/31/2010   Influenza, High Dose Seasonal PF 01/28/2014, 12/29/2014, 02/17/2016, 01/21/2018   Influenza-Unspecified 12/29/2012, 01/21/2021   PFIZER(Purple Top)SARS-COV-2 Vaccination 05/16/2019, 06/06/2019, 02/06/2020, 02/05/2021   Pneumococcal Conjugate-13 06/14/2017   Pneumococcal Polysaccharide-23 01/28/2012, 01/28/2014   Pneumococcal-Unspecified 04/24/2015   Tdap 12/29/2014   Zoster Recombinat (Shingrix) 03/13/2017, 05/24/2017   Zoster, Live 02/14/2012   Patient reports her biggest concern lately has been her shoulder pain. She just started hydrocodone and methocarbamol yesterday and is planning to have surgery soon, she is just waiting on the office to call her to give her a date for it.  Patient reports she was surprised to hear that she will come out of shoulder surgery pain free and wondered if this was even possible. She is going to Dr. Maureen Ralphs tomorrow for a second opinion.  Patient is also getting nerve stimulation and it takes about 12 sessions to help.  Patient is changing doctor's again for endocrinology as she is not happy with the PA and is going to a doctor instead. She reports her most recent A1c was 6.2%. She feels like her blood sugars are high lately and the PA brushed that off.  Patient's cardiologist also wants to get her started on an SGLT2 and she is worried about  the cost. She is aware that this will also help with her blood sugars and informed her that there is kidney benefit as well.  Conditions to be addressed/monitored:  Hypertension, Hyperlipidemia, Diabetes, Atrial Fibrillation, Coronary Artery Disease, GERD, Hypothyroidism, Overactive Bladder, and Neuropathy  Conditions addressed this visit: Hypertension, diabetes, hyperlipidemia  Care Plan : CCM Pharmacy Care Plan  Updates made by Viona Gilmore, [place] since 04/06/2022 12:00 AM     Problem: Problem: Hypertension, Hyperlipidemia, Diabetes, Atrial Fibrillation, Coronary Artery Disease, GERD, Hypothyroidism, Overactive Bladder, and Neuropathy      Long-Range Goal: Patient-Specific Goal   Start Date: 02/20/2021  Expected End Date: 02/20/2022  Recent Progress: On track  Priority: High  Note:   Current Barriers:  Unable to independently monitor therapeutic efficacy Suboptimal therapeutic regimen for diabetes  Pharmacist Clinical Goal(s):  Patient will achieve adherence to monitoring guidelines and medication adherence to achieve therapeutic efficacy maintain control of diabetes as evidenced by A1c  through collaboration with PharmD and provider.   Interventions: 1:1 collaboration with [name], NP regarding development and update of comprehensive plan of care as evidenced by provider  attestation and co-signature Inter-disciplinary care team collaboration (see longitudinal plan of care) Comprehensive medication review performed; medication list updated in electronic medical record  Hypertension (BP goal <130/80) -Controlled -Current treatment: Losartan 50 mg 1 tablet daily - Appropriate, Effective, Safe, Accessible Diltiazem 120 mg 1 capsule daily - Appropriate, Effective, Safe, Accessible -Medications previously tried: n/a -Current home readings: 135/70 (normal - this is high for her) - 110-120/70 (wrist cuff) -Current dietary habits: not a salt person; cooking at home; pre-packed vegetables (no canned vegetables) -Current exercise habits: not been able to exercise because she cannot walk with knees and back -Denies hypotensive/hypertensive symptoms -Educated on Importance of home blood pressure monitoring; Proper BP monitoring technique; Symptoms of hypotension and importance of maintaining adequate hydration; -Counseled to monitor BP at home weekly, document, and provide log at future appointments -Counseled on diet and exercise extensively Recommended to continue current medication Recommended purchasing an arm blood pressure cuff to ensure accuracy.  Hyperlipidemia: (LDL goal < 70) -Controlled -Current treatment: Repatha inject every 14 day - Appropriate, Effective, Safe, Accessible -Medications previously tried: statins (muscle aches), Zetia -Current dietary patterns: did not discuss -Current exercise habits: unable to -Educated on Cholesterol goals;  -Counseled on diet and exercise extensively Recommended to continue current medication Assessed patient finances. Renewed [facility].  CAD/History of stroke (Goal: prevent future events) -Controlled -Current treatment  Eliquis 5 mg 1 tablet twice daily - Appropriate, Effective, Safe, Accessible Nitroglycerin 0.4 mg 1 tablet as needed - Appropriate, Effective, Safe, Accessible -Medications previously  tried: aspirin  -Recommended to continue current medication Recommended checking expiration date for nitroglycerin.  Diabetes (A1c goal <7%) -Controlled (per patient report of recent A1c of 6.2%) -Current medications: Novolin N 28 units before breakfast - Appropriate, Effective, Safe, Query accessible Novolin R 10 units in morning and at night - Appropriate, Effective, Safe, Query accessible Metformin XR 500 mg 1 tablet twice daily - Appropriate, Query effective, Safe, Accessible -Medications previously tried: other insulins (switched due to cost) -Current home glucose readings fasting glucose: usually 95-130; recent readings: 154, 135, 166, 129, 116, 148, 175, 169 (twice a day) post prandial glucose: going down in the afternoon (right before dinner) - 132, 135, 143, 95 -Reports hypoglycemic/hyperglycemic symptoms (lethargy) -Current meal patterns:  breakfast: bran cereal or 2 eggs and toast or yogurt or croissant with cheese  lunch: not taking insulin in afternoon  dinner: meat and vegetables (varies) snacks: n/a drinks: water; stopped drinking ice brand -Current exercise: unable to -Educated on A1c and blood sugar goals; Prevention and management of hypoglycemic episodes; Benefits of routine self-monitoring of blood sugar; Continuous glucose monitoring; Carbohydrate counting and/or plate method -Counseled to check feet daily and get yearly eye exams -Counseled on diet and exercise extensively Recommended to continue current medication Recommended discussion with endo about starting a Dexcom or Freestyle Libre and switching insulins to newer and longer lasting ones (Basaglar and Humalog)  Atrial Fibrillation (Goal: prevent stroke and major bleeding) -Controlled -CHADSVASC: 9 -Current treatment: Rate control: Diltiazem 120 mg 1 capsule daily - Appropriate, Effective, Safe, Accessible Anticoagulation: Eliquis 5 mg 1 tablet twice daily - Appropriate, Effective, Safe, Query  accessible -Medications previously tried: none -Home BP and HR readings: normal at home is 68 -Counseled on increased risk of stroke due to Afib and benefits of anticoagulation for stroke prevention; bleeding risk associated with Eliquis and importance of self-monitoring for signs/symptoms of bleeding; avoidance of NSAIDs due to increased bleeding risk with anticoagulants; -Recommended to continue current medication Assessed patient finances. Patient does not qualify for patient assistance. Will find out why her copay is so high.  Depression/Anxiety (Goal: minimize symptoms) -Controlled -Current treatment: Escitalopram 20 mg 1 tablet daily - Appropriate, Effective, Safe, Accessible -Medications previously tried/failed: amitriptyline (no longer needed) -PHQ9: 0 -GAD7: n/a -Educated on Benefits of medication for symptom control Benefits of cognitive-behavioral therapy with or without medication -Recommended to continue current medication   Hypothyroidism (Goal: TSH 2.5-4.5) -Not ideally controlled (given osteopenia) -Current treatment  Levothyroxine 137 mcg 1 tablet every morning before breakfast  - Appropriate, Effective, Query Safe, Accessible -Medications previously tried: none  -Recommended repeat TSH due to fatigue.  Overactive bladder (Goal: minimize symptoms) -Controlled -Current treatment  Gemtesa 75 mg 1 tablet daily - Appropriate, Query effective, Safe, Query accessible -Medications previously tried: Myrbetriq (ineffective) -Recommended to continue current medication  GERD/Barrett's esophagus (Goal: minimize symptoms and protect esophagus) -Not ideally controlled -Current treatment  Omeprazole 20 mg 1 capsule twice daily - Appropriate, Query effective, Safe, Accessible -Medications previously tried: famotidine -Recommended to continue current medication Counseled on benefits of taking omeprazole with Barrett's esophagus.  Osteopenia (Goal prevent  fractures) -Controlled -Last DEXA Scan: 04/08/2017   T-Score femoral neck: -1.8  T-Score total hip: n/a  T-Score lumbar spine: -2.0  T-Score forearm radius: n/a  10-year probability of major osteoporotic fracture: 11.3%  10-year probability of hip fracture: 2.3% -Patient is not a candidate for pharmacologic treatment -Current treatment  Vitamin D 1000 units daily - Appropriate, Effective, Safe, Accessible -Medications previously tried: none  -Recommend [phone] units of vitamin D daily. Recommend 1200 mg of calcium daily from dietary and supplemental sources. Recommend weight-bearing and muscle strengthening exercises for building and maintaining bone density. -Recommended repeat DEXA.  Osteoarthritis (Goal: minimize pain) -Uncontrolled -Current treatment  Acetaminophen 500 mg 2 capsules as needed - Appropriate, Effective, Safe, Accessible -Medications previously tried: n/a  -Recommended trial of Voltaren gel for knee pain.  Health Maintenance -Vaccine gaps: influenza, COVID booster -Current therapy:  Multivitamin 1 tablet daily Vitamin B complex daily -Educated on Cost vs benefit of each product must be carefully weighed by individual consumer -Patient is satisfied with current therapy and denies issues -Recommended to continue current medication  Patient Goals/Self-Care Activities Patient will:  - check glucose at least daily, document, and provide at future appointments check blood pressure weekly, document, and provide at future appointments target a minimum of  150 minutes of moderate intensity exercise weekly  Follow Up Plan: The care management team will reach out to the patient again over the next 60 days.        Medication Assistance:  Insulins obtained through [facility] medication assistance program.  Enrollment ends 04/21/21  Compliance/Adherence/Medication fill history: Care Gaps: Foot exam, COVID booster, A1c, eye exam, urine microalbumin, influenza,  colonoscopy Last BP - 136/62 on 10/25/2021 Last A1C - 7.4 on 09/08/2020  Star-Rating Drugs: Losartan 50 mg - last filled 02/10/2022 90 DS at [facility] Metformin 500 mg - last filled 10/212023 90 DS at [place]  Patient's preferred pharmacy is:  [facility] [place] [address] Phone: [phone] Fax: [phone]   Uses pill box? Yes Pt endorses 90% compliance  We discussed: Current pharmacy is preferred with insurance plan and patient is satisfied with pharmacy services Patient decided to: Continue current medication management strategy  Care Plan and Follow Up Patient Decision:  Patient agrees to Care Plan and Follow-up.  Plan: The care management team will reach out to the patient again over the next 60 days.  [name], PharmD, [place] Pharmacist [facility] [place]

## 2022-04-04 NOTE — Telephone Encounter (Signed)
   Pre-operative Risk Assessment    Patient Name: Breanna Webster  DOB: October 12, 1945 MRN: [id number]      Request for Surgical Clearance    Procedure:   LEFT REVERSE TSA  Date of Surgery:  Clearance TBD                                 Surgeon:  DR. Esmond Plants Surgeon's Group or Practice Name:  [facility] Phone number:  [phone] ATTN: [place]  Fax number:  [phone]   Type of Clearance Requested:   - Medical  - Pharmacy:  Hold Apixaban (Eliquis)     Type of Anesthesia:   CHOICE   Additional requests/questions:    Jiles Prows   04/04/2022, 3:01 PM

## 2022-04-05 ENCOUNTER — Telehealth: Payer: Self-pay

## 2022-04-05 ENCOUNTER — Telehealth: Payer: Self-pay | Admitting: Adult Health

## 2022-04-05 ENCOUNTER — Other Ambulatory Visit (HOSPITAL_BASED_OUTPATIENT_CLINIC_OR_DEPARTMENT_OTHER): Payer: Self-pay

## 2022-04-05 DIAGNOSIS — M1711 Unilateral primary osteoarthritis, right knee: Secondary | ICD-10-CM | POA: Diagnosis not present

## 2022-04-05 NOTE — Telephone Encounter (Signed)
Dr. Tamala Julian, patient recently seen by you on 03/28/2022.  Request to undergo left reverse TSA with Dr. Alma Friendly.  Eliquis hold has been addressed by Pharm.D.  Could you please comment on medical clearance for upcoming procedure.  Please send your response to p cv div preop.  Thank you, Emmaline Life, NP-C 04/05/2022, 8:24 AM [address] Office [phone] Fax [phone]

## 2022-04-05 NOTE — Telephone Encounter (Signed)
Patient with diagnosis of afib on Eliquis for anticoagulation.    Procedure: left reverse TSA Date of procedure: TBD  CHA2DS2-VASc Score = 9  This indicates a 12.2% annual risk of stroke. The patient's score is based upon: CHF History: 1 HTN History: 1 Diabetes History: 1 Stroke History: 2 Vascular Disease History: 1 Age Score: 2 Gender Score: 1   CrCl 23m/min Platelet count 159K  Typically require 3 day Eliquis hold for ortho procedures. Pt is at elevated CV risk off of anticoagulation given CHADS2VASc score of 9 with prior stroke, will forward to MD for input.  **This guidance is not considered finalized until pre-operative APP has relayed final recommendations.**

## 2022-04-05 NOTE — Telephone Encounter (Signed)
Received clearance form for left reverse TSA, surgery date pending.   Patient has been stable from stroke standpoint over the past 3 years without any new or reoccurring stroke/TIA symptoms.  She is cleared to proceed with surgical procedure with small but acceptable risk of preprocedural stroke while off Eliquis, recommend restarting immediately after or once hemodynamically stable.  Forms completed and signed.

## 2022-04-05 NOTE — Telephone Encounter (Signed)
Left detailed message advising pt to return call and schedule a surgical clearance.

## 2022-04-05 NOTE — Telephone Encounter (Addendum)
   Primary Cardiologist: Sinclair Grooms, MD  Chart reviewed as part of pre-operative protocol coverage. Given past medical history and time since last visit, based on ACC/AHA guidelines, Breanna Webster would be at acceptable risk for the planned procedure without further cardiovascular testing.   Patient is at elevated CV risk off of anticoagulation given CHADS2VASc score of 9 with prior stroke, will forward to MD for input. Per Dr. Tamala Julian, patient may proceed with hold of 3 days.   I will route this recommendation to the requesting party via Epic fax function and remove from pre-op pool.  Please call with questions.  Emmaline Life, NP-C  04/05/2022, 2:50 PM [address], [address] Office [phone] Fax [phone]

## 2022-04-05 NOTE — Telephone Encounter (Signed)
Faxed form to [facility] at [phone]. Received fax confirmation.

## 2022-04-06 ENCOUNTER — Other Ambulatory Visit (INDEPENDENT_AMBULATORY_CARE_PROVIDER_SITE_OTHER): Payer: Medicare Other

## 2022-04-06 DIAGNOSIS — R3915 Urgency of urination: Secondary | ICD-10-CM | POA: Diagnosis not present

## 2022-04-06 DIAGNOSIS — R350 Frequency of micturition: Secondary | ICD-10-CM | POA: Diagnosis not present

## 2022-04-06 DIAGNOSIS — K7469 Other cirrhosis of liver: Secondary | ICD-10-CM | POA: Diagnosis not present

## 2022-04-06 LAB — HEPATIC FUNCTION PANEL
ALT: 27 U/L (ref 0–35)
AST: 20 U/L (ref 0–37)
Albumin: 4.2 g/dL (ref 3.5–5.2)
Alkaline Phosphatase: 74 U/L (ref 39–117)
Bilirubin, Direct: 0.2 mg/dL (ref 0.0–0.3)
Total Bilirubin: 0.8 mg/dL (ref 0.2–1.2)
Total Protein: 6.9 g/dL (ref 6.0–8.3)

## 2022-04-06 NOTE — Patient Instructions (Addendum)
Hi Abriana,  It was great to catch up again! I hope everything goes well with your shoulder surgery.  Please reach out to me if you have any questions or need anything! Best, Maddie  [name], PharmD, [facility] Pharmacist [facility]   Visit Information   Goals Addressed   None    Patient Care Plan: RNCM:Diabetes Type 2 (Adult)  Completed 03/09/2021   Problem Identified: Lack of Long Term plan for self managment of Type 2 DM Resolved 03/09/2021  Priority: Medium     Long-Range Goal: Effective long term self management of Type 2 DM Completed 03/09/2021  Start Date: 10/03/2020  Expected End Date: 03/23/2021  Recent Progress: On track  Priority: Medium  Note:   Resolving due to duplicate goal  Objective:  Lab Results  Component Value Date   HGBA1C 7.4 (H) 09/08/2020   Lab Results  Component Value Date   CREATININE 0.67 09/16/2020   CREATININE 0.74 09/11/2020   CREATININE 0.77 09/10/2020   No results found for: EGFR Current Barriers:  Knowledge Deficits related to basic Diabetes pathophysiology and self care/management Knowledge Deficits related to medications used for management of diabetes Unable to independently self manage Type 2 DM Unable to perform IADLs independently-Husband assists with transportation  Pt states that she her blood sugars have been up some this last week with readings up to 200.  States she has not changed her diet but she did think she and her husband had a mild cold/virus this last week  States she is to see Dr. [name] 02/28/21. States her last A1C was 6.9%. Denies any recent hypoglycemia Reports following a low CHO diet.  States her husband has been helping her as needed.  States she did have a fall that injured her shoulder Case Manager Clinical Goal(s):  patient will demonstrate improved adherence to prescribed treatment plan for diabetes self care/management as evidenced by: daily monitoring and  recording of CBG  adherence to ADA/ carb modified diet adherence to prescribed medication regimen contacting provider for new or worsened symptoms or questions Interventions:  Collaboration with Carlisle Cater, Tommi Rumps, NP regarding development and update of comprehensive plan of care as evidenced by provider attestation and co-signature Inter-disciplinary care team collaboration (see longitudinal plan of care) Provided education to patient about basic DM disease process Reviewed medications with patient and discussed importance of medication adherence Discussed plans with patient for ongoing care management follow up and provided patient with direct contact information for care management team Reinforced s/sx hypoglycemia and hyperglycemia and importance of correct treatment Reviewed scheduled/upcoming provider appointments including: no upcoming primary care provider scheduled, sleep study 02/11/21, GI 03/02/21, Dr. [name] 02/28/21 neurology 05/18/21 Reinforced to check cbg before meals and record, calling endocrinologist or primary care provider   for findings outside established parameters.   Review of patient status, including review of consultants reports, relevant laboratory and other test results, and medications completed. Reviewed importance of increased activity and fall prevention Self-Care Activities - Self administers oral medications as prescribed Self administers insulin as prescribed Attends all scheduled provider appointments Checks blood sugars as prescribed and utilize hyper and hypoglycemia protocol as needed Adheres to prescribed ADA/carb modified Patient Goals: - check blood sugar at prescribed times - check blood sugar if I feel it is too high or too low - enter blood sugar readings and medication or insulin into daily log - take the blood sugar log to all doctor visits - change to whole grain breads, cereal, pasta -  drink 6 to 8 glasses of water each day - fill half of plate  with vegetables - manage portion size - read food labels for fat, fiber, carbohydrates and portion size - switch to sugar-free drinks - keep appointment with eye doctor - check feet daily for cuts, sores or redness Follow Up Plan: Telephone follow up appointment with care management team member scheduled for: 03/09/21 at 11:30 AM The patient has been provided with contact information for the care management team and has been advised to call with any health related questions or concerns.      Patient Care Plan: RNCM:Urinary Incontinence (Adult)  Completed 03/09/2021   Problem Identified: Symptom Management (Urinary Incontinence) Resolved 03/09/2021  Priority: High     Long-Range Goal: Urinary Incontinence Symptoms Manged Completed 03/09/2021  Start Date: 10/03/2020  Expected End Date: 03/23/2021  Recent Progress: On track  Priority: High  Note:   Resolving due to duplicate goal  Current Barriers:  Ineffective Self Health Maintenance of urinary incontinence with hx of UTI, DM2, CAD, hx of CVA, atrial fib,  anemia Unable to independently self manage urinary incontinence Unable to perform IADLs independently-husband provides transportation to appointments States that she is still having nocturia 1-2 a night and she has urgency if she does not get to bathroom soon enough States she is to saw Dr. [name] on 12/20/20.  States she thinks the Myrbetic he gave her made her nauseated so she is not currently taking  Clinical Goal(s):  Collaboration with Pension scheme manager, Tommi Rumps, NP regarding development and update of comprehensive plan of care as evidenced by provider attestation and co-signature Inter-disciplinary care team collaboration (see longitudinal plan of care) patient will work with care management team to address care coordination and chronic disease management needs related to Disease Management Educational Needs Care Coordination   Interventions:  Evaluation of current treatment plan  related to CAD, HTN, HLD, DMII, Anxiety, and hx CVA, atrial fib , Transportation, ADL IADL limitations, and Inability to perform IADL's independently self-management and patient's adherence to plan as established by provider. Collaboration with [name], NP regarding development and update of comprehensive plan of care as evidenced by provider attestation       and co-signature Inter-disciplinary care team collaboration (see longitudinal plan of care) Discussed plans with patient for ongoing care management follow up and provided patient with direct contact information for care management team Reinforced on s/sx UTI and when to call MD Reinforced to try doing pelvic floor exercises when she feels urgency and to void every 2 hours during the day Reinforced to keep perineal area clean and dry Reinforced to keep urology appointments with Dr. [name] and encouraged to discuss treatment options for incontinence and overactive bladder Referral to CCM PharmD for medication review, adherence and polypharmacy  Self Care Activities:  Patient verbalizes understanding of plan to self manage urinary incontinence Self administers medications as prescribed Attends all scheduled provider appointments Calls pharmacy for medication refills Calls provider office for new concerns or questions Patient Goals: - clean and dry skin well - keep skin dry - use a fragrance-free lotion on skin - wear a protective pad or garment Follow Up Plan: Telephone follow up appointment with care management team member scheduled for: 03/09/21 at 11:30 AM The patient has been provided with contact information for the care management team and has been advised to call with any health related questions or concerns.      Patient Care Plan: RNCM:Cardiovascular Disease Management(HTN, CAD, Atrial fibrillation)  Completed 03/09/2021  Problem Identified: Lack of long term management of Cardiovascular Disease(HTN, CAD, Atrial  fibrillation Resolved 03/09/2021  Priority: Medium     Long-Range Goal: Effective Cardiovascular Disease Self  Management(HTN, CAD, Atrial fibrillation) Completed 03/09/2021  Start Date: 10/03/2020  Expected End Date: 03/23/2021  Recent Progress: On track  Priority: Medium  Note:   Resolving due to duplicate goal  Current Barriers:  Knowledge deficits related to self health management of Cardiovascular Disease Management(HTN, CAD, Atrial fibrillation)  Knowledge Deficits related to Self management of Cardiovascular Disease Management(HTN, CAD, Atrial fibrillation Care Coordination needs related to disease management  in a patient with Self management of Cardiovascular Disease Management(HTN, CAD, Atrial fibrillation Chronic Disease Management support and education needs related to Self management of Cardiovascular Disease Management(HTN, CAD, Atrial fibrillation Unable to independently Self management of Cardiovascular Disease Management(HTN, CAD, Atrial fibrillation Unable to perform IADLs independently States her B/P has been good with readings around 120-130/70-80, States she is having fatigue and is taking naps during the day.  States she has been having issues with her CPAP.  States she is to have a sleep study on 02/09/21.  States she thinks some of her medications are causing her drowsiness and wonders if she needs all of these medications No reports of chest pain or shortness of breath.  Nurse Case Manager Clinical Goal(s):  patient will take all medications exactly as prescribed and will call provider for medication related questions patient will verbalize understanding of Afib Action Plan and when to call doctor patient will verbalize understanding of plan for Self management of Cardiovascular Disease Management(HTN, CAD, Atrial fibrillation patient will meet with RN Care Manager to address Self management of Cardiovascular Disease Management(HTN, CAD, Atrial fibrillation patient will  take all medications exactly as prescribed and will call provider for medication related questions patient will attend all scheduled medical appointments: no upcoming primary care provider scheduled, sleep study 02/11/21, GI 03/02/21, Dr. [name] 02/28/21 neurology 05/18/21 patient will verbalize basic understanding of Self management of Cardiovascular Disease Management(HTN, CAD, Atrial fibrillation disease process and self health management plan as evidenced by verbalizing understanding of education, voiced adherence to treatment plan the patient will demonstrate ongoing self health care management ability as evidenced by voiced understanding of treatment plan and decrease in hospitalizations * Interventions:  Collaboration with [name], NP regarding development and update of comprehensive plan of care as evidenced by provider attestation and co-signature Inter-disciplinary care team collaboration (see longitudinal plan of care) Basic overview and discussion of HTN, CAD, Atrial fibrillation Medications reviewed Afib action plan reviewed Evaluation of current treatment plan related to Self management of Cardiovascular Disease Management(HTN, CAD, Atrial fibrillation and patient's adherence to plan as established by provider. Reinforced to discuss issues with Dr. Brett Fairy concerning her CPAP Reviewed education  re: HTN, CAD, Atrial fibrillation Reviewed medications with patient and discussed adherence Reinforced to monitor blood pressure daily and record, calling provider for findings outside established parameters.  Discussed plans with patient for ongoing care management follow up and provided patient with direct contact information for care management team Referral to CCM PharmD to review medications and issues with polypharmacy  Self-Care Activities: Self administers medications as prescribed Attends all scheduled provider appointments Calls pharmacy for medication refills Calls  provider office for new concerns or questions Patient Goals: - begin a symptom diary - make a plan to eat healthy - keep all lab appointments - take medicine as prescribed - check blood pressure 3 times per week - choose a place to take  my blood pressure (home, clinic or office, retail store) - write blood pressure results in a log or diary Follow Up Plan: Telephone follow up appointment with care management team member scheduled for: 03/09/21 at 11:30 AM The patient has been provided with contact information for the care management team and has been advised to call with any health related questions or concerns.      Patient Care Plan: CCM Pharmacy Care Plan     Problem Identified: Problem: Hypertension, Hyperlipidemia, Diabetes, Atrial Fibrillation, Coronary Artery Disease, GERD, Hypothyroidism, Overactive Bladder, and Neuropathy      Long-Range Goal: Patient-Specific Goal   Start Date: 02/20/2021  Expected End Date: 02/20/2022  Recent Progress: On track  Priority: High  Note:   Current Barriers:  Unable to independently monitor therapeutic efficacy Suboptimal therapeutic regimen for diabetes  Pharmacist Clinical Goal(s):  Patient will achieve adherence to monitoring guidelines and medication adherence to achieve therapeutic efficacy maintain control of diabetes as evidenced by A1c  through collaboration with PharmD and provider.   Interventions: 1:1 collaboration with [name], NP regarding development and update of comprehensive plan of care as evidenced by provider attestation and co-signature Inter-disciplinary care team collaboration (see longitudinal plan of care) Comprehensive medication review performed; medication list updated in electronic medical record  Hypertension (BP goal <130/80) -Controlled -Current treatment: Losartan 50 mg 1 tablet daily - Appropriate, Effective, Safe, Accessible Diltiazem 120 mg 1 capsule daily - Appropriate, Effective, Safe,  Accessible -Medications previously tried: n/a -Current home readings: 135/70 (normal - this is high for her) - 110-120/70 (wrist cuff) -Current dietary habits: not a salt person; cooking at home; pre-packed vegetables (no canned vegetables) -Current exercise habits: not been able to exercise because she cannot walk with knees and back -Denies hypotensive/hypertensive symptoms -Educated on Importance of home blood pressure monitoring; Proper BP monitoring technique; Symptoms of hypotension and importance of maintaining adequate hydration; -Counseled to monitor BP at home weekly, document, and provide log at future appointments -Counseled on diet and exercise extensively Recommended to continue current medication Recommended purchasing an arm blood pressure cuff to ensure accuracy.  Hyperlipidemia: (LDL goal < 70) -Controlled -Current treatment: Repatha inject every 14 day - Appropriate, Effective, Safe, Accessible -Medications previously tried: statins (muscle aches), Zetia -Current dietary patterns: did not discuss -Current exercise habits: unable to -Educated on Cholesterol goals;  -Counseled on diet and exercise extensively Recommended to continue current medication Assessed patient finances. Renewed [facility].  CAD/History of stroke (Goal: prevent future events) -Controlled -Current treatment  Eliquis 5 mg 1 tablet twice daily - Appropriate, Effective, Safe, Accessible Nitroglycerin 0.4 mg 1 tablet as needed - Appropriate, Effective, Safe, Accessible -Medications previously tried: aspirin  -Recommended to continue current medication Recommended checking expiration date for nitroglycerin.  Diabetes (A1c goal <7%) -Controlled (per patient report of recent A1c of 6.2%) -Current medications: Novolin N 28 units before breakfast - Appropriate, Effective, Safe, Query accessible Novolin R 10 units in morning and at night - Appropriate, Effective, Safe, Query  accessible Metformin XR 500 mg 1 tablet twice daily - Appropriate, Query effective, Safe, Accessible -Medications previously tried: other insulins (switched due to cost) -Current home glucose readings fasting glucose: usually 95-130; recent readings: 154, 135, 166, 129, 116, 148, 175, 169 (twice a day) post prandial glucose: going down in the afternoon (right before dinner) - 132, 135, 143, 95 -Reports hypoglycemic/hyperglycemic symptoms (lethargy) -Current meal patterns:  breakfast: bran cereal or 2 eggs and toast or yogurt or croissant with cheese  lunch: not  taking insulin in afternoon  dinner: meat and vegetables (varies) snacks: n/a drinks: water; stopped drinking ice brand -Current exercise: unable to -Educated on A1c and blood sugar goals; Prevention and management of hypoglycemic episodes; Benefits of routine self-monitoring of blood sugar; Continuous glucose monitoring; Carbohydrate counting and/or plate method -Counseled to check feet daily and get yearly eye exams -Counseled on diet and exercise extensively Recommended to continue current medication Recommended discussion with endo about starting a Dexcom or Freestyle Libre and switching insulins to newer and longer lasting ones (Basaglar and Humalog)  Atrial Fibrillation (Goal: prevent stroke and major bleeding) -Controlled -CHADSVASC: 9 -Current treatment: Rate control: Diltiazem 120 mg 1 capsule daily - Appropriate, Effective, Safe, Accessible Anticoagulation: Eliquis 5 mg 1 tablet twice daily - Appropriate, Effective, Safe, Query accessible -Medications previously tried: none -Home BP and HR readings: normal at home is 68 -Counseled on increased risk of stroke due to Afib and benefits of anticoagulation for stroke prevention; bleeding risk associated with Eliquis and importance of self-monitoring for signs/symptoms of bleeding; avoidance of NSAIDs due to increased bleeding risk with anticoagulants; -Recommended to  continue current medication Assessed patient finances. Patient does not qualify for patient assistance. Will find out why her copay is so high.  Depression/Anxiety (Goal: minimize symptoms) -Controlled -Current treatment: Escitalopram 20 mg 1 tablet daily - Appropriate, Effective, Safe, Accessible -Medications previously tried/failed: amitriptyline (no longer needed) -PHQ9: 0 -GAD7: n/a -Educated on Benefits of medication for symptom control Benefits of cognitive-behavioral therapy with or without medication -Recommended to continue current medication   Hypothyroidism (Goal: TSH 2.5-4.5) -Not ideally controlled (given osteopenia) -Current treatment  Levothyroxine 137 mcg 1 tablet every morning before breakfast  - Appropriate, Effective, Query Safe, Accessible -Medications previously tried: none  -Recommended repeat TSH due to fatigue.  Overactive bladder (Goal: minimize symptoms) -Controlled -Current treatment  Gemtesa 75 mg 1 tablet daily - Appropriate, Query effective, Safe, Query accessible -Medications previously tried: Myrbetriq (ineffective) -Recommended to continue current medication  GERD/Barrett's esophagus (Goal: minimize symptoms and protect esophagus) -Not ideally controlled -Current treatment  Omeprazole 20 mg 1 capsule twice daily - Appropriate, Query effective, Safe, Accessible -Medications previously tried: famotidine -Recommended to continue current medication Counseled on benefits of taking omeprazole with Barrett's esophagus.  Osteopenia (Goal prevent fractures) -Controlled -Last DEXA Scan: 04/08/2017   T-Score femoral neck: -1.8  T-Score total hip: n/a  T-Score lumbar spine: -2.0  T-Score forearm radius: n/a  10-year probability of major osteoporotic fracture: 11.3%  10-year probability of hip fracture: 2.3% -Patient is not a candidate for pharmacologic treatment -Current treatment  Vitamin D 1000 units daily - Appropriate, Effective, Safe,  Accessible -Medications previously tried: none  -Recommend [id number] units of vitamin D daily. Recommend 1200 mg of calcium daily from dietary and supplemental sources. Recommend weight-bearing and muscle strengthening exercises for building and maintaining bone density. -Recommended repeat DEXA.  Osteoarthritis (Goal: minimize pain) -Uncontrolled -Current treatment  Acetaminophen 500 mg 2 capsules as needed - Appropriate, Effective, Safe, Accessible -Medications previously tried: n/a  -Recommended trial of Voltaren gel for knee pain.  Health Maintenance -Vaccine gaps: influenza, COVID booster -Current therapy:  Multivitamin 1 tablet daily Vitamin B complex daily -Educated on Cost vs benefit of each product must be carefully weighed by individual consumer -Patient is satisfied with current therapy and denies issues -Recommended to continue current medication  Patient Goals/Self-Care Activities Patient will:  - check glucose at least daily, document, and provide at future appointments check blood pressure weekly, document, and provide at future  appointments target a minimum of 150 minutes of moderate intensity exercise weekly  Follow Up Plan: The care management team will reach out to the patient again over the next 60 days.      Patient Care Plan: RN Care Manager Plan of Care     Problem Identified: Chronic Disease Management and Care Coordination Needs (DM2, CAD, Afib,HTN. Hx CVA ) Resolved 11/20/2021  Priority: High     Long-Range Goal: Establish Plan of Care for Chronic Disease Management Needs (DM2, CAD, Afib,HTN. Hx CVA ) Completed 11/20/2021  Start Date: 03/09/2021  Expected End Date: 07/20/2022  Recent Progress: On track  Priority: High  Note:   Current Barriers:  Care Coordination needs related to Financial constraints related to cost of medications Chronic Disease Management support and education needs related to Atrial Fibrillation, CAD, HTN, DMII, GERD, Overactive  Bladder, and hx CVA Pt states she is still going to water therapy and she states she feels better when she is in the water. States she is  joining [employer] and plans to do water exercises there.  States she saw Dr. Wynelle Link and she got injections in her knees. States she will need to get different gel shots at some time  States she is to see cardiology today about her leg swelling.  States she is wearing compression hose. States she does have to get up 1-2 times a night to urinate.   Denies any chest pains but does get winded with exertion.  States  she is to see a new endocrinologist PA next week.  States her CBGs had been AM ranges of 150-160 and normal later in the day.  Denies any recent hypoglycemia. States she has not been checking her B/P at home as she has checked at her doctors appointments RNCM Clinical Goal(s):  Patient will verbalize understanding of plan for management of Atrial Fibrillation, CAD, HTN, DMII, GERD, Overactive Bladder, and hx CVA as evidenced by voiced adherence to plan of care verbalize basic understanding of  Atrial Fibrillation, CAD, HTN, DMII, GERD, Overactive Bladder, and hx CVA disease process and self health management plan as evidenced by voiced understanding and teach back take all medications exactly as prescribed and will call provider for medication related questions as evidenced by dispense report and pt verbalization attend all scheduled medical appointments:  Cardiology 11/20/21 CCM PharmD 03/07/22, Annual Wellness Visit 04/05/22 as evidenced by medical records demonstrate Improved adherence to prescribed treatment plan for Atrial Fibrillation, CAD, HTN, DMII, GERD, Overactive Bladder, and hx CVA as evidenced by readings within limits, voiced adherence to plan of care continue to work with RN Care Manager to address care management and care coordination needs related to  Atrial Fibrillation, CAD, HTN, DMII, GERD, Overactive Bladder, and hx CVA as evidenced by  adherence to CM Team Scheduled appointments work with pharmacist to address Financial constraints related to cost of Myrbetriq and Eliquis related toAtrial Fibrillation, CAD, HTN, DMII, GERD, Overactive Bladder, and hx CVA as evidenced by review or EMR and patient or pharmacist report through collaboration with RN Care manager, provider, and care team.   Interventions: 1:1 collaboration with primary care provider regarding development and update of comprehensive plan of care as evidenced by provider attestation and co-signature Inter-disciplinary care team collaboration (see longitudinal plan of care) Evaluation of current treatment plan related to  self management and patient's adherence to plan as established by provider  Falls Interventions:  (Status:  Goal Met.) Long Term Goal Reviewed medications and discussed potential side effects of  medications such as dizziness and frequent urination Advised patient of importance of notifying provider of falls Assessed for falls since last encounter Assessed patients knowledge of fall risk prevention secondary to previously provided education Reinforced to continue to do exercises PT is teaching her to get stronger and going to water therapy.  Reinforced to use cane at all times     AFIB Interventions: (Status:  Goal Met.) Long Term Goal   Counseled on increased risk of stroke due to Afib and benefits of anticoagulation for stroke prevention Reviewed importance of adherence to anticoagulant exactly as prescribed Counseled on bleeding risk associated with Eliquis and importance of self-monitoring for signs/symptoms of bleeding Counseled on avoidance of NSAIDs due to increased bleeding risk with anticoagulants Counseled on seeking medical attention after a head injury or if there is blood in the urine/stool Afib action plan reviewed     CAD Interventions: (Status:  Goal Met.) Long Term Goal Assessed understanding of CAD diagnosis Medications  reviewed including medications utilized in CAD treatment plan Provided education on importance of blood pressure control in management of CAD Provided education on Importance of limiting foods high in cholesterol Counseled on importance of regular laboratory monitoring as prescribed Reviewed Importance of taking all medications as prescribed Reviewed Importance of attending all scheduled provider appointments Advised to report any changes in symptoms or exercise tolerance Reinforced s/sx to call provider or 911   Diabetes Interventions:  (Status:  Goal Met.) Long Term Goal Assessed patient's understanding of A1c goal: <7% Provided education to patient about basic DM disease process Reviewed medications with patient and discussed importance of medication adherence Counseled on importance of regular laboratory monitoring as prescribed Discussed plans with patient for ongoing care management follow up and provided patient with direct contact information for care management team Advised patient, providing education and rationale, to check cbg twice a day and record, calling provider for findings outside established parameters Reinforced with pt need to get new endocrinologist. Communicated with provider that pt has not been contacted about referral for new endocrinologist.  Reinforced to try to eat lunch or small snack before taking her nap to help prevent hypoglycemia Lab Results  Component Value Date   HGBA1C 7.4 (H) 09/08/2020  6.3% 08/28/21 at endocrinology  Hyperlipidemia Interventions:  (Status:  Goal Met.) Long Term Goal Medication review performed; medication list updated in electronic medical record.  Provider established cholesterol goals reviewed Counseled on importance of regular laboratory monitoring as prescribed Reviewed role and benefits of statin for ASCVD risk reduction Reviewed importance of limiting foods high in cholesterol Reviewed exercise goals and target of 150 minutes  per week Reinforced to continue Repatha   Hypertension Interventions:  (Status:  Goal Met.) Long Term Goal Last practice recorded BP readings:  BP Readings from Last 3 Encounters:  11/16/21 110/80  11/07/21 126/70  10/25/21 136/62  Most recent eGFR/CrCl: No results found for: EGFR  No components found for: CRCL  Evaluation of current treatment plan related to hypertension self management and patient's adherence to plan as established by provider Provided education to patient re: stroke prevention, s/s of heart attack and stroke Reviewed medications with patient and discussed importance of compliance Counseled on the importance of exercise goals with target of 150 minutes per week Discussed plans with patient for ongoing care management follow up and provided patient with direct contact information for care management team Advised patient, providing education and rationale, to monitor blood pressure daily and record, calling PCP for findings outside established parameters  Provided education on prescribed diet low sodium low CHO heart healthy  Stroke:  (Status:Goal Met.) Long Term Goal Reviewed Importance of taking all medications as prescribed Advised to report any changes in symptoms or exercise tolerance Assessed for management of bladder and/or bowel incontinence Reinforced to keep appointment with neurology. Reinforced to continue PT to help with balance and strength    Urinary frequency Goal Met. Long Term Goal Evaluation of current treatment plan related to Overactive Bladder, Financial constraints related to cost of Myrbetric  self-management and patient's adherence to plan as established by provider. Discussed plans with patient for ongoing care management follow up and provided patient with direct contact information for care management team Evaluation of current treatment plan related to Urinary frequency and patient's adherence to plan as established by provider Pharmacy  referral for communicated to PharmD pts issues with cost of Gemtesa  Discussed plans with patient for ongoing care management follow up and provided patient with direct contact information for care management team Reinforced to limit fluids later in the evening to help decrease night time trips to the bathroom. Reinforced to keep appointments  Dr. [name] and urology. Reviewed to contact urology office to see if she can get samples of Gemtesa or change her something less expensive    Patient Goals/Self-Care Activities: Take all medications as prescribed Attend all scheduled provider appointments Call pharmacy for medication refills 3-7 days in advance of running out of medications Call provider office for new concerns or questions  keep appointment with eye doctor check blood sugar at prescribed times: twice daily and when you have symptoms of low or high blood sugar fill half of plate with vegetables manage portion size keep feet up while sitting wash and dry feet carefully every day wear comfortable, cotton socks wear comfortable, well-fitting shoes check pulse (heart) rate once a day make a plan to eat healthy take medicine as prescribed check blood pressure daily choose a place to take my blood pressure (home, clinic or office, retail store) call doctor for signs and symptoms of high blood pressure keep all doctor appointments eat more whole grains, fruits and vegetables, lean meats and healthy fats call for medicine refill 2 or 3 days before it runs out take all medications exactly as prescribed call doctor with any symptoms you believe are related to your medicine Follow Up Plan:  The patient has been provided with contact information for the care management team and has been advised to call with any health related questions or concerns.  No further follow up required: RNCM Case closed goals met          Patient verbalizes understanding of instructions and care plan  provided today and agrees to view in [place]. Active MyChart status and patient understanding of how to access instructions and care plan via MyChart confirmed with patient.    The pharmacy team will reach out to the patient again over the next 60 days.   Viona Gilmore, [facility]

## 2022-04-09 ENCOUNTER — Ambulatory Visit (HOSPITAL_COMMUNITY)
Admission: RE | Admit: 2022-04-09 | Discharge: 2022-04-09 | Disposition: A | Discharge status: Home / Self Care | Payer: Medicare Other | Source: Ambulatory Visit | Attending: Gastroenterology | Admitting: Gastroenterology

## 2022-04-09 DIAGNOSIS — K7469 Other cirrhosis of liver: Secondary | ICD-10-CM | POA: Insufficient documentation

## 2022-04-09 DIAGNOSIS — K7460 Unspecified cirrhosis of liver: Secondary | ICD-10-CM | POA: Diagnosis not present

## 2022-04-09 DIAGNOSIS — Z9049 Acquired absence of other specified parts of digestive tract: Secondary | ICD-10-CM | POA: Diagnosis not present

## 2022-04-09 LAB — AFP TUMOR MARKER: AFP-Tumor Marker: 2.4 ng/mL

## 2022-04-10 ENCOUNTER — Other Ambulatory Visit (HOSPITAL_BASED_OUTPATIENT_CLINIC_OR_DEPARTMENT_OTHER): Payer: Self-pay

## 2022-04-10 ENCOUNTER — Encounter: Payer: Self-pay | Admitting: Family Medicine

## 2022-04-10 ENCOUNTER — Ambulatory Visit (INDEPENDENT_AMBULATORY_CARE_PROVIDER_SITE_OTHER): Payer: Medicare Other | Admitting: Family Medicine

## 2022-04-10 VITALS — BP 110/60 | HR 75 | Temp 97.8°F | Ht 65.0 in | Wt 171.1 lb

## 2022-04-10 DIAGNOSIS — I639 Cerebral infarction, unspecified: Secondary | ICD-10-CM | POA: Diagnosis not present

## 2022-04-10 DIAGNOSIS — R051 Acute cough: Secondary | ICD-10-CM | POA: Diagnosis not present

## 2022-04-10 DIAGNOSIS — J019 Acute sinusitis, unspecified: Secondary | ICD-10-CM

## 2022-04-10 MED ORDER — AMOXICILLIN-POT CLAVULANATE 875-125 MG PO TABS
1.0000 | ORAL_TABLET | Freq: Two times a day (BID) | ORAL | 0 refills | Status: DC
  Filled 2022-04-10: qty 20, 10d supply, fill #0

## 2022-04-10 MED ORDER — BENZONATATE 100 MG PO CAPS
100.0000 mg | ORAL_CAPSULE | Freq: Three times a day (TID) | ORAL | 0 refills | Status: DC | PRN
  Filled 2022-04-10: qty 30, 10d supply, fill #0

## 2022-04-10 NOTE — Progress Notes (Signed)
Established Patient Office Visit  Subjective   Patient ID: Breanna Webster, female    DOB: 1945-12-03  Age: 76 y.o. MRN: [id number]  Chief Complaint  Patient presents with   Cough    [place] complains of cough, x1 week, Productive cough with yellowish and greenish sputum    Sinusitis    Patient complains of sinusitis, x1 week,      HPI   Breanna Webster is seen accompanied by her husband.  She has complicated past medical history of multiple chronic problems including history of heart failure with preserved ejection fraction, hypertension, pulmonary hypertension, CAD, atrial fibrillation, history of aortic valve replacement, history of previous CVAs, Parkinson's disease,, history of streptococcal endocarditis, type 2 diabetes, obstructive sleep apnea, hypothyroidism  Seen today with 1 week history of cough productive of yellow and green sputum.  Some similar sinus congestion.  COVID test negative at home.  No headaches.  No facial pain.  No dyspnea at rest.  No nausea or vomiting  Past Medical History:  Diagnosis Date   Anxiety    Arthritis    "back" (04/22/2018)   Back pain    Blood transfusion without reported diagnosis    CAD (coronary artery disease)    a. 03/2018 s/p PCI/DES to the RCA (3.0x15 Onyx DES).   Carotid artery stenosis    Mild   Chest pain    CHF (congestive heart failure) (HCC)    Chronic lower back pain    Cirrhosis (HCC)    Colon polyps    Diverticulitis    Diverticulosis    Esophageal thickening    seen on pre TAVR CT scan, also questionable cirrhosis. MRI recommended. Will refer to GI   Fatty liver    GERD (gastroesophageal reflux disease)    Grave's disease    Heart murmur    History of colonic polyps 05/22/2017   History of hiatal hernia    Hyperlipidemia    Hypertension    Hypothyroidism    IBS (irritable bowel syndrome)    Osteopenia    Parkinson's syndrome 04/23/2020   Pulmonary nodules    seen on pre TAVR CT. likley benign. no follow up  recommended if pt low risk.   S/P TAVR (transcatheter aortic valve replacement)    Severe aortic stenosis    Shortness of breath on exertion    Stroke ([place])    Thalassemia minor    Thyroid disease    Type II diabetes mellitus ([place])    Past Surgical History:  Procedure Laterality Date   37 HOUR [place] STUDY N/A 03/03/2018   Procedure: 24 HOUR PH STUDY;  Surgeon: Mauri Pole, MD;  Location: [facility];  Service: Endoscopy;  Laterality: N/A;   AORTIC VALVE REPLACEMENT  06/2018   COLONOSCOPY     COLONOSCOPY W/ BIOPSIES AND POLYPECTOMY     CORONARY ANGIOGRAPHY Right 04/21/2018   Procedure: CORONARY ANGIOGRAPHY (CATH LAB);  Surgeon: [name], MD;  Location: [place] CV LAB;  Service: Cardiovascular;  Laterality: Right;   CORONARY STENT INTERVENTION N/A 04/22/2018   Procedure: CORONARY STENT INTERVENTION;  Surgeon: [name], MD;  Location: [place] CV LAB;  Service: Cardiovascular;  Laterality: N/A;   DILATION AND CURETTAGE OF UTERUS     ESOPHAGEAL MANOMETRY N/A 03/03/2018   Procedure: ESOPHAGEAL MANOMETRY (EM);  Surgeon: Mauri Pole, MD;  Location: [facility];  Service: Endoscopy;  Laterality: N/A;   GASTRIC FUNDOPLICATION     HERNIA REPAIR     HYSTEROSCOPY  fibroids   LAPAROSCOPIC CHOLECYSTECTOMY     LAPAROSCOPY     fibroids   NISSEN FUNDOPLICATION  8938B   POLYPECTOMY     RIGHT/LEFT HEART CATH AND CORONARY ANGIOGRAPHY N/A 02/20/2018   Procedure: RIGHT/LEFT HEART CATH AND CORONARY ANGIOGRAPHY;  Surgeon: [name], MD;  Location: [place] CV LAB;  Service: Cardiovascular;  Laterality: N/A;   TEE WITHOUT CARDIOVERSION N/A 07/08/2018   Procedure: TRANSESOPHAGEAL ECHOCARDIOGRAM (TEE);  Surgeon: Burnell Blanks, MD;  Location: [place];  Service: Open Heart Surgery;  Laterality: N/A;   TEE WITHOUT CARDIOVERSION  10/07/2018   TEE WITHOUT CARDIOVERSION N/A 10/07/2018   Procedure: TRANSESOPHAGEAL ECHOCARDIOGRAM (TEE);  Surgeon: Jerline Pain, MD;  Location: [facility] ENDOSCOPY;  Service: Cardiovascular;  Laterality: N/A;   TONSILLECTOMY     TRANSCATHETER AORTIC VALVE REPLACEMENT, TRANSFEMORAL N/A 07/08/2018   Procedure: TRANSCATHETER AORTIC VALVE REPLACEMENT, TRANSFEMORAL;  Surgeon: Burnell Blanks, MD;  Location: [place];  Service: Open Heart Surgery;  Laterality: N/A;    reports that she has never smoked. She has never been exposed to tobacco smoke. She has never used smokeless tobacco. She reports that she does not drink alcohol and does not use drugs. family history includes Headache in an other family member; Healthy in her son; Heart failure in her mother. She was adopted. Allergies  Allergen Reactions   Statins Other (See Comments)    Muscle aches    Review of Systems  Constitutional:  Negative for chills and fever.  HENT:  Positive for congestion.   Respiratory:  Positive for cough.   Cardiovascular:  Negative for chest pain.  Neurological:  Negative for headaches.      Objective:     BP 110/60 (BP Location: Left Arm, Patient Position: Sitting, Cuff Size: Normal)   Pulse 75   Temp 97.8 F (36.6 C) (Oral)   Ht 5' 5"  (1.651 m)   Wt 171 lb 1.6 oz (77.6 kg)   SpO2 97%   BMI 28.47 kg/m    Physical Exam Vitals reviewed.  HENT:     Right Ear: Tympanic membrane normal.     Left Ear: Tympanic membrane normal.  Cardiovascular:     Rate and Rhythm: Normal rate.  Pulmonary:     Effort: Pulmonary effort is normal.     Breath sounds: No wheezing or rales.  Neurological:     Mental Status: She is alert.      No results found for any visits on 04/10/22.    The ASCVD Risk score (Arnett DK, et al., 2019) failed to calculate for the following reasons:   The patient has a prior MI or stroke diagnosis    Assessment & Plan:   Patient presents with almost 10-day history of cough and sinus congestive symptoms.  Not improving over the past 10 days.  Complicated past medical history as above.  Home COVID test  negative.  She is in no respiratory distress with O2 sats 97% and relatively clear lung exam.  -Given no improvement in symptoms over 10 days we will go and start Augmentin 875 mg twice daily for 10 days -Tessalon Perles 100 mg every 8 hours as needed for cough -Follow-up immediately or go to ER for any fever or increased shortness of breath.  Carolann Littler, MD

## 2022-04-11 ENCOUNTER — Other Ambulatory Visit (HOSPITAL_BASED_OUTPATIENT_CLINIC_OR_DEPARTMENT_OTHER): Payer: Self-pay

## 2022-04-12 DIAGNOSIS — M1711 Unilateral primary osteoarthritis, right knee: Secondary | ICD-10-CM | POA: Diagnosis not present

## 2022-04-13 ENCOUNTER — Telehealth: Payer: Self-pay

## 2022-04-13 DIAGNOSIS — R350 Frequency of micturition: Secondary | ICD-10-CM | POA: Diagnosis not present

## 2022-04-13 DIAGNOSIS — R3915 Urgency of urination: Secondary | ICD-10-CM | POA: Diagnosis not present

## 2022-04-13 NOTE — Telephone Encounter (Signed)
Contacted patient on preferred number listed in notes for scheduled AWV. Husband stated patient was sick and unable to do visit today will call back to reschedule.

## 2022-04-17 ENCOUNTER — Telehealth: Payer: Self-pay | Admitting: Adult Health

## 2022-04-17 DIAGNOSIS — M8580 Other specified disorders of bone density and structure, unspecified site: Secondary | ICD-10-CM | POA: Diagnosis not present

## 2022-04-17 DIAGNOSIS — Z01411 Encounter for gynecological examination (general) (routine) with abnormal findings: Secondary | ICD-10-CM | POA: Diagnosis not present

## 2022-04-17 DIAGNOSIS — Z124 Encounter for screening for malignant neoplasm of cervix: Secondary | ICD-10-CM | POA: Diagnosis not present

## 2022-04-17 DIAGNOSIS — Z01419 Encounter for gynecological examination (general) (routine) without abnormal findings: Secondary | ICD-10-CM | POA: Diagnosis not present

## 2022-04-17 DIAGNOSIS — Z6829 Body mass index (BMI) 29.0-29.9, adult: Secondary | ICD-10-CM | POA: Diagnosis not present

## 2022-04-17 NOTE — Telephone Encounter (Signed)
Spoke with patient to schedule  She was at dr office and requested call back

## 2022-04-18 ENCOUNTER — Telehealth: Payer: Self-pay

## 2022-04-18 NOTE — Telephone Encounter (Signed)
Received a preoperative Risk assessment via fax. Called pt to set an appt. Up but spouse stated she was asleep. He will have her call back to schedule an appt. For surgical clearance.

## 2022-04-19 ENCOUNTER — Encounter: Payer: Self-pay | Admitting: Hematology

## 2022-04-19 ENCOUNTER — Ambulatory Visit (INDEPENDENT_AMBULATORY_CARE_PROVIDER_SITE_OTHER): Payer: Medicare Other | Admitting: Adult Health

## 2022-04-19 ENCOUNTER — Other Ambulatory Visit (HOSPITAL_BASED_OUTPATIENT_CLINIC_OR_DEPARTMENT_OTHER): Payer: Self-pay

## 2022-04-19 ENCOUNTER — Encounter: Payer: Self-pay | Admitting: Adult Health

## 2022-04-19 VITALS — BP 121/65 | HR 76 | Ht 65.0 in | Wt 175.2 lb

## 2022-04-19 DIAGNOSIS — G214 Vascular parkinsonism: Secondary | ICD-10-CM

## 2022-04-19 DIAGNOSIS — Z8673 Personal history of transient ischemic attack (TIA), and cerebral infarction without residual deficits: Secondary | ICD-10-CM | POA: Diagnosis not present

## 2022-04-19 DIAGNOSIS — M1711 Unilateral primary osteoarthritis, right knee: Secondary | ICD-10-CM | POA: Diagnosis not present

## 2022-04-19 MED ORDER — AMANTADINE HCL 100 MG PO CAPS
100.0000 mg | ORAL_CAPSULE | Freq: Two times a day (BID) | ORAL | 5 refills | Status: DC
  Filled 2022-04-19: qty 60, 30d supply, fill #0

## 2022-04-19 NOTE — Progress Notes (Signed)
175.2 

## 2022-04-19 NOTE — Patient Instructions (Signed)
Recommend trialing amantadine 100 mg twice daily, please call with any difficulty tolerating or after 2-3 weeks if no benefit for dosage increase  Continue Eliquis (apixaban) daily  and Repatha for secondary stroke prevention  Continue to follow up with PCP regarding blood pressure and cholesterol management  Maintain strict control of hypertension with blood pressure goal below 130/90 and cholesterol with LDL cholesterol (bad cholesterol) goal below 70 mg/dL.   Signs of a Stroke? Follow the BEFAST method:  Balance Watch for a sudden loss of balance, trouble with coordination or vertigo Eyes Is there a sudden loss of vision in one or both eyes? Or double vision?  Face: Ask the person to smile. Does one side of the face droop or is it numb?  Arms: Ask the person to raise both arms. Does one arm drift downward? Is there weakness or numbness of a leg? Speech: Ask the person to repeat a simple phrase. Does the speech sound slurred/strange? Is the person confused ? Time: If you observe any of these signs, call 911.     Follow-up with Dr. Brett Fairy in 3 to 4 months or call earlier if needed       Thank you for coming to see [name] at [facility]. I hope we have been able to provide you high quality care today.  You may receive a patient satisfaction survey over the next few weeks. We would appreciate your feedback and comments so that we may continue to improve ourselves and the health of our patients.    Amantadine Capsules or Tablets What is this medication? AMANTADINE (a MAN ta deen) prevents and treats infections caused by the flu virus (influenza). It works by slowing the spread of the flu virus in your body and reducing how long your symptoms last. It will not treat colds or infections caused by bacteria or other viruses. It will not replace the annual flu vaccine. It may also be used to treat movement disorders, including those caused by Parkinson disease and some  medications. It works by balancing substances in your brain that help manage body movements and coordination. This reduces symptoms, such as body stiffness and tremors. This medicine may be used for other purposes; ask your health care provider or pharmacist if you have questions. COMMON BRAND NAME(S): Symmetrel What should I tell my care team before I take this medication? They need to know if you have any of these conditions: Depression Eczema Feel sleepy or have fallen asleep suddenly during the day Frequently drink alcohol Glaucoma Heart failure Kidney disease Low blood pressure Schizophrenia Seizures Sleep apnea Suicidal thoughts, plans, or attempt by you or a family member An unusual or allergic reaction to amantadine, other medications, foods, dyes, or preservatives Pregnant or trying to get pregnant Breast-feeding How should I use this medication? Take this medication by mouth with water. Take it as directed on the prescription label at the same time every day. You can take it with or without food. If it upsets your stomach, take it with food. Take all of this medication unless your care team tells you to stop it early. Keep taking it even if you think you are better. Talk to your care team about the use of this medication in children. While it may be prescribed for children for selected conditions, precautions do apply. People over 8 years of age may have a stronger reaction and need a smaller dose. Overdosage: If you think you have taken too much of this medicine contact  a poison control center or emergency room at once. NOTE: This medicine is only for you. Do not share this medicine with others. What if I miss a dose? If you miss a dose, take it as soon as you can. If it is almost time for your next dose, take only that dose. Do not take double or extra doses. What may interact with this medication? Acetazolamide Alcohol Antihistamines for allergy, cough, and  cold Atropine Bupropion Certain medications for bladder problems, such as oxybutynin or tolterodine Certain medications for Parkinson disease, such as benztropine or trihexyphenidyl Certain medications for stomach problems, such as dicyclomine or hyoscyamine Certain medications for travel sickness, such as scopolamine Ipratropium Methazolamide Quinidine Quinine Sodium bicarbonate Some flu vaccines Thioridazine This list may not describe all possible interactions. Give your health care provider a list of all the medicines, herbs, non-prescription drugs, or dietary supplements you use. Also tell them if you smoke, drink alcohol, or use illegal drugs. Some items may interact with your medicine. What should I watch for while using this medication? Visit your care team for regular checks on your progress. Tell your care team if your symptoms do not start to get better or if they get worse. A severe reaction similar to neuroleptic malignant syndrome (NMS) may occur if you reduce the dose of or stop taking this medication too quickly. Symptoms of NMS include high fever, stiff muscles, increased sweating, fast or irregular heartbeat, and confusion. Contact your care team right away if think you have NMS. This medication may affect your coordination, reaction time, or judgment. Do not drive or operate machinery until you know how this medication affects you. Sit up or stand slowly to reduce the risk of dizzy or fainting spells. Drinking alcohol with this medication can increase the risk of these side effects. When taking this medication, you may fall asleep without notice. You may be doing activities, such as driving a car, talking, or eating. You may not feel drowsy before it happens. Contact your care team right away if this happens to you. There have been reports of increased sexual urges or other strong urges, such as gambling while taking this medication. If you experience any of these while taking  this medication, you should report this to your care team as soon as possible. This medication may cause dry eyes and blurred vision. If you wear contact lenses, you may feel some discomfort. Lubricating eye drops may help. See your care team if the problem does not go away or is severe. Your mouth may get dry. Chewing sugarless gum or sucking hard candy and drinking plenty of water may help. Contact your care team if the problem does not go away or is severe. Talk to your care team about your risk of skin cancer. You may be more at risk for skin cancer if you take this medication. What side effects may I notice from receiving this medication? Side effects that you should report to your care team as soon as possible: Allergic reactions--skin rash, itching, hives, swelling of the face, lips, tongue, or throat Falling asleep during daily activities Low blood pressure--dizziness, feeling faint or lightheaded, blurry vision Mood and behavior changes--anxiety, nervousness, confusion, hallucinations, irritability, hostility, thoughts of suicide or self-harm, worsening mood, feelings of depression Trouble passing urine Urges to engage in impulsive behaviors such as gambling, binge eating, sexual activity, or shopping in ways that are unusual for you Side effects that usually do not require medical attention (report these to your care  team if they continue or are bothersome): Blurry vision Confusion Constipation Dizziness Dry mouth Swelling of the ankles, hands, or feet Trouble sleeping This list may not describe all possible side effects. Call your doctor for medical advice about side effects. You may report side effects to FDA at 1-800-FDA-1088. Where should I keep my medication? Keep out of the reach of children and pets. Store at room temperature between 20 and 25 degrees C (68 and 77 degrees F). Keep the container tightly closed. Get rid of any unused medication after the expiration date. To get  rid of medications that are no longer needed or have expired: Take the medication to a medication take-back program. Check with your pharmacy or law enforcement to find a location. If you cannot return the medication, check the label or package insert to see if the medication should be thrown out in the garbage or flushed down the toilet. If you are not sure, ask your care team. If it is safe to put it in the trash, pour the medication out of the container. Mix the medication with cat litter, dirt, coffee grounds, or other unwanted substance. Seal the mixture in a bag or container. Put it in the trash. NOTE: This sheet is a summary. It may not cover all possible information. If you have questions about this medicine, talk to your doctor, pharmacist, or health care provider.  2023 Elsevier/Gold Standard (2021-07-12 00:00:00)

## 2022-04-19 NOTE — Progress Notes (Signed)
[facility] [address] [place]. [place] [id number] [phone]       OFFICE FOLLOW UP NOTE  [name] Date of Birth:  12/20/1945 Medical Record Number:  [id number]   Reason for Referral: stroke follow up    CHIEF COMPLAINT:  Chief Complaint  Patient presents with   Room 2    Pt is here with her Husband. Pt states that things haven't been going good since last appointment. Pt states that she is having more tremors in both hands.      HPI:  Update 04/19/2022 JM: Patient returns for routine follow-up accompanied by her husband.  She was previously seen by Dr. Brett Fairy on 10/25/2021 for vascular parkinsonism.  Reported continued difficulty tolerating Sinemet despite lower dosage and controlled release formulation.  Dr. Brett Fairy recommended initiating Requip at prior visit, patient reports she took for couple weeks but no significant benefit therefore she self discontinued.  She believes tremors have been gradually worsening since prior visit more so affecting left hand and occasionally affecting right hand, has been having some head tremors more recently.  Continues to have chronic gait impairment with imbalance, stable since prior visit.  She also complains of chronic fatigue which is not a new issue.  Stable from stroke standpoint without new stroke/TIA symptoms.  Occasional dizziness and double vision, stable since prior visit.  Compliant on Eliquis and Repatha.  Blood pressure well-controlled.  Continues to follow with cardiology and PCP.     History provided for reference purposes only Update 08/14/2021 JM: Patient returns for 53-monthstroke follow-up accompanied by her husband. Her main concern today is in regards to recent DaTscan that was completed 4/20 ordered by Dr. DBrett Fairyfor LUE tremor.  She was seen by Dr. DBrett Fairylast month for LUE tremor, recommended trial of Sinemet but discontinued after about 1 week due to no benefit for tremor. Per  DaTscan report, findings suspicious for parkinsonian pathology, recommended trial of Sinemet. She has not yet started this as she did not have prior benefit with it so wasn't sure if she needed to trial this again.  She had multiple questions at today's visit regarding DaTscan and possibility of Parkinson's, etiology of Parkinson's, voices frustration by the fact that she could not be told 100% if she has Parkinson's or not based on recent scan and multiple questions regarding treatment moving forward. She has a follow-up visit with Dr. DBrett Fairyin June but would like to discuss all of this sooner. Did work with OT after prior visit but placed on hold until imaging completed as they were unsure if they were treating her for essential tremor or Parkinson's.  Continues to work with PT for imbalance and unsteadiness. Per husband, has noticed improvement of her balance doing aquatic therapy. She does continue to have bilateral knee pain which she feels may be contributing.  Continues to have occasional dizziness which is chronic as well as intermittent double vision.  Currently being followed by Dr. GKaty Fitchand has follow-up visit next month. Denies new stroke/TIA symptoms or any other new neurological concerns.  Compliant on Eliquis and Repatha, denies side effects.  Blood pressure today 132/74.  Closely follows with cardiology and PCP.  No further concerns at this time.  Update 04/13/2021 JM: Patient returns for acute visit due to increased falls and multiple other concerns.   Falls:  ED eval 12/14 post fall with c/o left flank pain and HA. CTH no acute finding. Lab work showed stable chronic anemia and  hyperglycemia with improvement after D50. Fall occurred after "tripping over her feet" per husband. Per ED note, left orbital and left flank bruising. CT abdomen unremarkable. Reports fall 2 weeks ago where she lost her balance -felt like she was being pulled towards the left.  Denies associated dizziness at that  time but does have chronic intermittent dizziness. Imbalance present since her stroke. Does admit to limited physical activity or exercise due to fear of falling - she reports she keeps busy going to different doctors appointments. She does report chronic b/l knee and lower back pain.  Chronic imbalance from stroke 2.5 years ago. She is concerned that this has worsened over the past 2 weeks and very concerned about possible new stroke. Denies any increased stressors or new medications.   Left Lower Breast Pain:  complaining of sharp left breast pain which started after fall last week. It is constant but worse with coughing, sneezing, lying down and with deep breaths. Radiates into back and right midline anterior. CT abdomen obtained at ED visit which was unremarkable. She is trying to schedule an appt with her cardiologist. Has appt with PCP tomorrow morning.   Transient right eye vision loss:  chronic issue. Prior extensive work-up unremarkable. Plans on following up with Dr. Katy Fitch for further evaluation  Left hand tremor, worsening:  present since prior stroke. Feels like this is worsening as it is "moving up my arm" where previously only in hand. Does not interfere with daily activity or functioning.    She reports compliance with Eliquis 5 mg twice daily. Plans on starting Repatha due to continued elevated LDL despite statin usage.  Both Eliquis and Repatha management cardiology. Followed by PCP for DM management - prior A1c 7.4. noted hypoglycemia during recent ED visit - denies this occurring at home.    No further concerns at this time   Update 03/14/2021 AL: she called yesterday reporting a 5-15 minute episode of severe dizziness. Symptoms resolved spontaneously. She reports having a similar event 2-3 weeks ago of sudden dizziness. She reports not being able to stand up due to dizziness. She states that she was dry heaving. She was very nauseous. Symptoms lasted about 5 minutes. She also  reports having "black out of right eye vision" that lasts 20 seconds twice over the past 2-3 weeks. Not associated with dizziness or headache. She has had a visual disturbance (husband states she has not mentioned this to him) a couple of times over the past few days where she sees a zig zag line in the right eye. Again, no headache or dizziness associated with visual disturbance. She had a similar event of right sided vision loss in 03/2020. CTA head and neck unremarkable for acute process. Right paratracheal nodule noted. She has seen optometry but not ophthalmologist. She does not drink much water due to overactive bladder. She drinks coffee in the mornings and usually tea at night.    She continues Eliquis, diltiazem, losartan. BP was 148/71 at beginning of visit, repeated manually 15 minutes later and was 140/60. Orthostatics 125/74 P 77 lying, 119/66 P 78 with standing and patient reported dizziness, 115/62 P 83 standing (less dizzy) and 115/58 P 80 after three minutes standing (less dizzy). She reports allergy to statin therapy. She has follow up echo with cardiology in 2 weeks. She was started on abx and steroids yesterday following visit with pulmonology for productive cough. BP was 112/72 at that visit.    Update 06/22/2020 JM: Mrs. Mcneeley returns for stroke  follow-up accompanied by her husband.  Reports continued mild left hand tremor with mild left hand weakness -stable without worsening Prior complaints of gait unsteadiness -reports great improvement after working with PT -she has been able to increase ambulation distance - per husband, able to walk 3-4 blocks without stopping where previously difficulty ambulating very short distances She also reports occasional dizziness typically with position changes that last short duration  She does report in December, she presented to the ED with acute onset right eye visual loss which lasted approximately 15 minutes and then completely resolved.  CTA  head/neck unremarkable. Reports evaluation by ophthalmology with no concerning findings that may have possibly contributed.  She has not had any additional visual loss or visual symptoms  Reports compliance on Eliquis 5 mg twice daily -denies bleeding or bruising Glucose levels stable with recent A1c 6.5 (04/2020) Routinely follows with cardiology, PCP and endocrinology  Due to concerns of continued excessive daytime fatigue, she was referred to [facility] for possible repeat sleep study as prior sleep study in 08/2018 was negative for sleep apnea.  Evaluated by Dr. Brett Fairy and underwent sleep study on 03/29/2020 which showed AHI 12.8 with REM sleep associated with continuous low oxygen saturation with total O2 desat time 70 minutes with SPO2 nadir 76% and mild PLM.  Underwent CPAP titration 04/21/2020 with apnea unresponsive to hypopneas, apnea and desaturations but OSA responded partially to BiPAP 14/10 with central sleep apnea present with higher BiPAP pressures, sleep-related hypoxemia improved greatly on CPAP and BiPAP, presence of mild PLM confirmed.  Recommended initiating auto BiPAP which she started on 2/9.  She appears to be tolerating without difficulty but concerned as she continues to feel daytime fatigue.  No further concerns at this time   Update 12/21/2019 JM: Ms. Sadiq returns for stroke follow-up.  She is accompanied by her husband. Residual deficits of left hand tremor, unsteadiness and short-term memory impairment.  She will have occasional dizziness but typically upon standing from seated position.  Denies any reoccurring diplopia Prior concern of worsening diplopia, imbalance, dizziness and left hand shaking therefore obtain MRI which was negative for acute abnormality She was referred to PT at prior visit but for unknown reason, they were not called to schedule initial evaluation.  She is interested in pursuing PT Denies new stroke/TIA symptoms Remains on Eliquis 5 mg  twice daily without bleeding or bruising for secondary stroke prevention and history of atrial fibrillation Blood pressure today 128/64 Glucose levels stable per patient HTN, HLD and DM currently managed by PCP She has multiple other complaints today: She also reports excessive daytime fatigue, insomnia, occasional nocturia and snoring Underwent sleep study approximately 1 year ago which showed mild sleep apnea with AHI 5.0.  Does report worsening daytime fatigue and insomnia and she continues to question accuracy of sleep study results She is currently being evaluated by neurosurgery for right underarm and chest numbness/tingling with concerns of thoracic involvement.  Plans on obtaining MRI cervical and thoracic scheduled on 01/15/2020 No further concerns at this time  Update 09/14/2019 JM: Ms. Pietila is a 76 year old female with underlying medical history of DVT history, aortic stenosis s/p TAVR 06/2018, hypothyroidism, pulmonary hypertension, HTN, DM, CAD s/p stenting 03/2018, hx of infective endocarditis 09/2018 and multifocal bilateral anterior posterior infarcts secondary to new onset A. fib in 09/2018.  Patient requests appointment today with concern for intermittent double vision, imbalance, dizziness and left hand shaking.  She is accompanied by her husband.  Previously seen roughly  6 months ago stable from a stroke standpoint and advised to follow-up as needed.  Prior complaints of intermittent diplopia, left hemiparesis and imbalance secondary to multiple cardioembolic strokes throughout the supratentorial brain, brainstem and cerebellum including multiple lesions within the corpus callosum.  She believes she continued to have intermittent symptoms but believes over the past few months symptoms have worsened or become more frequent.  she did have ophthalmology evaluation on 09/07/2019 with concern of bilateral diplopia worsened with visual tasks with saccadic changes.  Experiences worsening  dizziness or diplopia while watching TV for too long.  Also experience dizziness if standing too quickly or rapid head movements.  Symptoms only last for 10 to 20 seconds and then resolve.  Episodes occur 1-2 times per week.  Denies headache, migraine, worsening weakness, numbness/tingling, speech impairment or confusion.  She endorses ongoing compliance with all prescribed medications including Eliquis 5 mg twice daily.  Blood pressure monitored at home which has been stable with today's reading 125/75.  Underlying mild cognitive impairment post stroke which has been stable without worsening.  She questions clearance for driving.  Continues to follow with PCP for HTN, HLD and DM management.  No further concerns at this time.  Update 03/12/2019 JM: Ms. Laing is a 76 year old female who is being seen today for stroke follow-up accompanied by her husband.  She continues to experience balance difficulties, mild left hemiparesis and mild cognitive impairment without much improvement.  Her greatest concern today is ongoing shortness of breath which has been present since the beginning of this year.  Her shortness of breath severely limits her daily functioning and activity.  Recently seen by Dr. Tamala Julian, her cardiologist, who felt continued shortness of breath was multifactorial but highly encouraged increasing activity and possibly cardiac rehab.  She feels as though "something is being missed" and is overly frustrated.  She also continues to experience excessive daytime fatigue and insomnia at night.  Prior sleep study negative for OSA.  She continues on Eliquis and aspirin without bleeding or bruising.  Blood pressure today satisfactory at 128/78.  Continues on home Lexapro 20 mg daily and feels as though depression and anxiety stable.  She also continues on amitriptyline 25 mg nightly for prior concerns with headache.  She denies any reoccurring headaches at this time.  No further concerns at this  time.  Initial visit 12/10/2018 JM: Residual deficits of mild left hemiparesis, mild left facial droop with occasional speech difficulty, balance difficulties, decreased vision and short-term memory concerns.  She does have difficulty ambulating at times stating her legs feel "rubbery" typically worse worsening in the morning and in the evening.  She is able to ambulate without assistive device.  Prior complaints of left-sided vision with sensation of a shade pulled over her left eye but this is since resolved and recently obtained new prescription glasses by ophthalmology.  She also endorses short-term memory concerns such as forgetting recent question/answer or her husband having to continuously repeat himself.  She denies improvement but her husband states he has seen improvement of her overall ambulation and memory concerns.  She has not received any therapy as this was not recommended at discharge.  She is questioning possible participation in therapy. Previously experiencing left-sided severe headache with recent cessation with continued use of  amitriptyline 25 mg nightly and gabapentin 300 mg nightly.  It was recommended to discontinue gabapentin 300 mg nightly by PCP as she did not gain benefit but she is not aware of this and has  continued.  Symptoms previously located left occipital radiating to frontal region with short duration sharp stabbing radiating pain which appears to be secondary to possible occipital neuralgia. Previously underwent sleep study for possible OSA which was negative Continues on Eliquis and aspirin 81 mg without bleeding or bruising Continues to follow with PCP for DM management Blood pressure today 107/60 States depression/anxiety well controlled with continuation of Lexapro No further concerns at this time  Stroke admission 10/21/2018: Ms. ANTONELLA UPSON is a 76 y.o. female with history of DVT, aortic stenosis s/p TAVR in March 2020, hypothyroidism, pulmonary  hypertension, HTN, CADs/p stenting December 2019 recently admitted for infective endocarditis on the mitral valve treated with 2 weeks gentamicin and ceftriaxone via PEG who presented on 10/21/2018 to the hospital with increased shortness of breath and confusion x2 weeks along with headache.  No focal neurologic symptoms.  New A. Fib with RVR found in the ED and started on heparin drip.  Neurology consulted with work-up revealing multifocal bilateral anterior and posterior infarcts cardioembolic pattern as evidenced on MRI secondary to new onset A. fib versus recent endocarditis.  MRI showed diffuse scattered subcentimeter foci of reduced diffusion and intermediate diffusion throughout the supratentorial brain, brainstem and cerebellum including multiple lesions within the corpus callosum.  Vessel imaging unremarkable with only mild arthrosclerotic bilateral ICA bifurcations.  2D echo normal EF without mention of vegetation.  Recommended initiating D [place] with new onset of A. fib.  HTN stable.  LDL 85 and due to history of statin allergy with myalgias, statin initiation was not recommended.  A1c 7.5 and recommended follow-up with PCP for uncontrolled DM.  Other stroke risk factors include advanced age, obesity, CAD, MVR and status post TAVR 06/2018.  She was discharged home in stable condition without therapy.  She returned to ED on 11/17/2018 with complaints of sudden onset left-sided headache located left parietal occipital area at the base of her neck.  CTA unremarkable for dissection or other abnormality.  She did receive 2 migraine cocktails with improvement of headache symptoms.  She has since followed up with her PCP who initiated gabapentin along with continuation of nortriptyline 50 mg nightly.  She apparently did not gain benefit from gabapentin therefore recommended discontinuing gabapentin and nortriptyline by PCP and initiated on amitriptyline.  She did have follow-up with ophthalmologist with concerns of  occipital neuralgia.        ROS:   14 system review of systems performed and negative with exception of see HPI  PMH:  Past Medical History:  Diagnosis Date   Anxiety    Arthritis    "back" (04/22/2018)   Back pain    Blood transfusion without reported diagnosis    CAD (coronary artery disease)    a. 03/2018 s/p PCI/DES to the RCA (3.0x15 Onyx DES).   Carotid artery stenosis    Mild   Chest pain    CHF (congestive heart failure) (HCC)    Chronic lower back pain    Cirrhosis (HCC)    Colon polyps    Diverticulitis    Diverticulosis    Esophageal thickening    seen on pre TAVR CT scan, also questionable cirrhosis. MRI recommended. Will refer to GI   Fatty liver    GERD (gastroesophageal reflux disease)    Grave's disease    Heart murmur    History of colonic polyps 05/22/2017   History of hiatal hernia    Hyperlipidemia    Hypertension    Hypothyroidism  IBS (irritable bowel syndrome)    Osteopenia    Parkinson's syndrome 04/23/2020   Pulmonary nodules    seen on pre TAVR CT. likley benign. no follow up recommended if pt low risk.   S/P TAVR (transcatheter aortic valve replacement)    Severe aortic stenosis    Shortness of breath on exertion    Stroke (HCC)    Thalassemia minor    Thyroid disease    Type II diabetes mellitus (HCC)     PSH:  Past Surgical History:  Procedure Laterality Date   75 HOUR [place] STUDY N/A 03/03/2018   Procedure: 24 HOUR PH STUDY;  Surgeon: Mauri Pole, MD;  Location: [facility];  Service: Endoscopy;  Laterality: N/A;   AORTIC VALVE REPLACEMENT  06/2018   COLONOSCOPY     COLONOSCOPY W/ BIOPSIES AND POLYPECTOMY     CORONARY ANGIOGRAPHY Right 04/21/2018   Procedure: CORONARY ANGIOGRAPHY (CATH LAB);  Surgeon: [name], MD;  Location: [facility];  Service: Cardiovascular;  Laterality: Right;   CORONARY STENT INTERVENTION N/A 04/22/2018   Procedure: CORONARY STENT INTERVENTION;  Surgeon: [name], MD;   Location: [facility];  Service: Cardiovascular;  Laterality: N/A;   DILATION AND CURETTAGE OF UTERUS     ESOPHAGEAL MANOMETRY N/A 03/03/2018   Procedure: ESOPHAGEAL MANOMETRY (EM);  Surgeon: Mauri Pole, MD;  Location: [facility];  Service: Endoscopy;  Laterality: N/A;   GASTRIC FUNDOPLICATION     HERNIA REPAIR     HYSTEROSCOPY     fibroids   LAPAROSCOPIC CHOLECYSTECTOMY     LAPAROSCOPY     fibroids   NISSEN FUNDOPLICATION  [id number]   POLYPECTOMY     RIGHT/LEFT HEART CATH AND CORONARY ANGIOGRAPHY N/A 02/20/2018   Procedure: RIGHT/LEFT HEART CATH AND CORONARY ANGIOGRAPHY;  Surgeon: [name], MD;  Location: [facility];  Service: Cardiovascular;  Laterality: N/A;   TEE WITHOUT CARDIOVERSION N/A 07/08/2018   Procedure: TRANSESOPHAGEAL ECHOCARDIOGRAM (TEE);  Surgeon: Burnell Blanks, MD;  Location: [facility];  Service: Open Heart Surgery;  Laterality: N/A;   TEE WITHOUT CARDIOVERSION  10/07/2018   TEE WITHOUT CARDIOVERSION N/A 10/07/2018   Procedure: TRANSESOPHAGEAL ECHOCARDIOGRAM (TEE);  Surgeon: Jerline Pain, MD;  [facility];  Service: Cardiovascular;  Laterality: N/A;   TONSILLECTOMY     TRANSCATHETER AORTIC VALVE REPLACEMENT, TRANSFEMORAL N/A 07/08/2018   Procedure: TRANSCATHETER AORTIC VALVE REPLACEMENT, TRANSFEMORAL;  Surgeon: Burnell Blanks, MD;  Location: [place];  Service: Open Heart Surgery;  Laterality: N/A;    Social History:  Social History   Socioeconomic History   Marital status: Married    Spouse name: Not on file   Number of children: 2   Years of education: Not on file   Highest education level: Not on file  Occupational History   Occupation: Tree surgeon of the [employer]  Tobacco Use   Smoking status: Never    Passive exposure: Never   Smokeless tobacco: Never  Vaping Use   Vaping Use: Never used  Substance and Sexual Activity   Alcohol use: No    Alcohol/week: 0.0 standard drinks of alcohol    Drug use: Never   Sexual activity: Not Currently  Other Topics Concern   Not on file  Social History Narrative   Lives with husband in a one story home.     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  Education: college.   Social Determinants of Health   Financial Resource Strain: Medium Risk (11/13/2021)   Overall Financial Resource Strain (CARDIA)    Difficulty of Paying Living Expenses: Somewhat hard  Food Insecurity: No Food Insecurity (03/01/2022)   Hunger Vital Sign    Worried About Running Out of Food in the Last Year: Never true    Ran Out of Food in the Last Year: Never true  Transportation Needs: Unknown (03/01/2022)   PRAPARE - Hydrologist (Medical): No    Lack of Transportation (Non-Medical): Not on file  Physical Activity: Inactive (04/03/2021)   Exercise Vital Sign    Days of Exercise per Week: 0 days    Minutes of Exercise per Session: 0 min  Stress: No Stress Concern Present (04/03/2021)   [place]    Feeling of Stress : Not at all  Social Connections: [place] (04/03/2021)   Social Connection and Isolation Panel [NHANES]    Frequency of Communication with Friends and Family: More than three times a week    Frequency of Social Gatherings with Friends and Family: More than three times a week    Attends Religious Services: More than 4 times per year    Active Member of [employer] or Organizations: Yes    Attends Music therapist: More than 4 times per year    Marital Status: Married  Human resources officer Violence: Not At Risk (04/03/2021)   Humiliation, Afraid, Rape, and Kick questionnaire    Fear of Current or Ex-Partner: No    Emotionally Abused: No    Physically Abused: No    Sexually Abused: No    Family History:  Family History  Adopted: Yes  Problem Relation Age of Onset    Healthy Son        x 2   Headache Other        Cluster headaches   Heart failure Mother    Colon cancer Neg Hx    Pancreatic cancer Neg Hx    Rectal cancer Neg Hx    Stomach cancer Neg Hx     Medications:   Current Outpatient Medications on File Prior to Visit  Medication Sig Dispense Refill   amoxicillin-clavulanate (AUGMENTIN) 875-125 MG tablet Take 1 tablet by mouth 2 (two) times daily. 20 tablet 0   apixaban (ELIQUIS) 5 MG TABS tablet Take 1 tablet (5 mg total) by mouth 2 (two) times daily. 180 tablet 3   b complex vitamins capsule Take 1 capsule by mouth daily.     BD PEN NEEDLE NANO 2ND GEN 32G X 4 MM MISC      benzonatate (TESSALON PERLES) 100 MG capsule Take 1 capsule (100 mg total) by mouth 3 (three) times daily as needed for cough. 30 capsule 0   cholecalciferol (VITAMIN D) 25 MCG (1000 UNIT) tablet Take 1,000 Units by mouth daily.     Continuous Blood Gluc Receiver (FREESTYLE LIBRE READER) DEVI Use as directed 1 each 0   Continuous Blood Gluc Sensor (FREESTYLE LIBRE 3 SENSOR) MISC Place one sensor every 14 days 2 each 11   diltiazem (CARDIZEM CD) 120 MG 24 hr capsule Take 1 capsule (120 mg total) by mouth daily. 90 capsule 3   escitalopram (LEXAPRO) 20 MG tablet TAKE ONE TABLET BY MOUTH DAILY 90 tablet 1   Evolocumab (REPATHA SURECLICK) 893 MG/ML SOAJ Inject 1 dose into the skin every 14 (fourteen) days. 6 mL 3  furosemide (LASIX) 20 MG tablet Take 2 tablets (21m) by mouth daily on 9/13, 9/14, and 9/15. Then take 1 tablet (261m by mouth daily. (Patient taking differently: Take 20 mg by mouth every other day. Take 2 tablets (4088mby mouth daily on 9/13, 9/14, and 9/15. Then take 1 tablet (57m57my mouth daily.) 90 tablet 3   glucose blood (ONETOUCH VERIO) test strip USE TO CHECK BLOOD SUGAR THREE TIMES DAILY 300 strip 4   insulin NPH Human (HUMULIN N) 100 UNIT/ML injection Inject 20 units under the skin in the morning and 14 units in evening 20 mL 5   Insulin NPH, Human,,  Isophane, (HUMULIN N KWIKPEN) 100 UNIT/ML Kiwkpen Inject 20 units into the skin every morning and 14 units every evening. 15 mL 6   Insulin Pen Needle (UNIFINE PENTIPS) 32G X 4 MM MISC Inject 4 times subcutaneously 400 each 2   insulin regular (HUMULIN R) 100 units/mL injection Inject 10 Units total into the skin 3 (three) times daily before meals. 10 mL 5   Insulin Regular Human (NOVOLIN R FLEXPEN RELION) 100 UNIT/ML KwikPen Inject 5 to 10 units under the skin three times daily. 15 mL 6   Insulin Syringe-Needle U-100 (ULTICARE INSULIN SYRINGE) 31G X 5/16" 1 ML MISC Inject 4 times daily subcutaneously 100 each 1   Lancets (ONETOUCH DELICA PLUS [id number] Use twice daily 200 each 1   levothyroxine (SYNTHROID) 137 MCG tablet Take 1 tablet by mouth every morning on an empty stomach  BRAND NAME ONLY 90 tablet 1   losartan (COZAAR) 50 MG tablet TAKE ONE TABLET BY MOUTH DAILY 90 tablet 3   metFORMIN (GLUCOPHAGE-XR) 500 MG 24 hr tablet Take 1 tablet by mouth 2 times daily 180 tablet 2   Multiple Vitamin (MULITIVITAMIN WITH MINERALS) TABS Take 1 tablet by mouth daily.     nitroGLYCERIN (NITROSTAT) 0.4 MG SL tablet Place 1 tablet (0.4 mg total) under the tongue every 5 (five) minutes as needed for chest pain. 25 tablet 4   NOVOLIN N 100 UNIT/ML injection Inject 28 Units into the skin daily before breakfast. Taking 20 units every morning and 8 units every evening     omeprazole (PRILOSEC) 20 MG capsule Take 1 capsule (20 mg total) by mouth 2 (two) times daily before a meal. (Patient taking differently: Take 20 mg by mouth daily.) 60 capsule 3   potassium chloride SA (KLOR-CON M) 20 MEQ tablet Take 1 tablet (20 mEq total) by mouth daily as needed. Take with Furosemide (Lasix). 90 tablet 3   Vibegron (GEMTESA) 75 MG TABS Take 1 tablet by mouth daily. 30 tablet 11   HYDROcodone-acetaminophen (NORCO) 5-325 MG tablet Take 1 tablet by mouth every 6 (six) hours. (Patient not taking: Reported on 04/19/2022) 60  tablet 0   meclizine (ANTIVERT) 25 MG tablet Take 1 tablet (25 mg total) by mouth 3 (three) times daily as needed for dizziness. (Patient not taking: Reported on 04/19/2022) 30 tablet 0   methocarbamol (ROBAXIN) 500 MG tablet Take 1 tablet (500 mg total) by mouth 4 (four) times daily. 60 tablet 0   ondansetron (ZOFRAN) 4 MG tablet Take 1 tablet (4 mg total) by mouth every 8 (eight) hours as needed for nausea or vomiting. (Patient not taking: Reported on 04/19/2022) 20 tablet 0   No current facility-administered medications on file prior to visit.    Allergies:   No Active Allergies    Physical Exam  Vitals:   04/19/22 1242  BP: 121/65  Pulse: 76  Weight: 175 lb 3.2 oz (79.5 kg)  Height: 5' 5"  (1.651 m)    Body mass index is 29.15 kg/m. No results found.   General: well developed, well nourished, pleasant elderly Caucasian female, seated, mildly anxious Head: head normocephalic and atraumatic.  Very slight tremor noted in jaw Neck: supple with no carotid or supraclavicular bruits Cardiovascular: regular rate and rhythm, no murmurs Musculoskeletal: Limited LUE shoulder ROM 2/2 pain Skin:  no rash/petichiae Vascular:  Normal pulses all extremities   Neurologic Exam Mental Status: Awake and fully alert. Fluent speech and language. Oriented to place and time. Recent memory mildly impaired and remote memory intact.  Attention span, concentration and fund of knowledge mostly appropriate. Mood and affect appropriate although mildly anxious.  Cranial Nerves: Pupils equal, briskly reactive to light. Extraocular movements full without nystagmus although mild OS convergence insufficiency.  Visual fields full to confrontation. Hearing intact. Facial sensation intact.  Mild left lower facial weakness - chronic  Motor: Normal bulk and tone.  Full strength in all tested extremities except slight decreased left hand dexterity, mild LUE postural tremor and mild to moderate resting tremor.  No  evidence of tremor in RUE.  Unable to appreciate cogwheel rigidity. Sensory.: intact to touch , pinprick , position and vibratory sensation.  Coordination: Rapid alternating movements normal in all extremities except slightly decreased left hand. Finger-to-nose showed slight left hand ataxia and heel-to-shin performed accurately bilaterally.  Gait and Station: Arises from chair with mild difficulty. Stance is normal. Gait demonstrates slow cautious steps with mild imbalance and generalized mild swaying from side to side.  Tandem walk and heel toe not attempted.  Romberg negative. Reflexes: 1+ and symmetric. Toes downgoing.        ASSESSMENT: AYLANA HIRSCHFELD is a 76 y.o. year old female with history of multifocal bilateral anterior and posterior infarcts in 09/3014 embolic pattern secondary to new onset A-fib vs recent endocarditis. Vascular risk factors include history of DVT, aortic stenosis status post TAVR 06/2018, pulmonary hypertension, HTN, DM, CAD status post stenting 03/2018, endocarditis, and A. Fib on Eliquis. Prior dx of sleep apnea but on recent sleep study no evidence of sleep apnea.  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    PLAN:   Hx multiple strokes including cerebellar and pontine stroke:  Residual deficits: mild LUE decreased dexterity with intermittent tremor and cognitive impairment.  Continue Eliquis (apixaban) daily and Repatha for secondary stroke prevention.   Discussed secondary stroke prevention measures and importance of close PCP/endocrinology follow-up for aggressive stroke risk factor management including HTN with BP goal<130/90 and DM with A1c goal<7 as well as cardiology for atrial fibrillation and Eliquis management  Vascular parkinsonism:  Reports progression of LUE tremor, occasional tremor in RUE and now head tremor Recommend trialing amantadine 100 mg twice daily, advised to call after 2 to 3 weeks if no benefit for dosage increase or sooner  if any difficulty tolerating. Intolerant to Sinemet and denied benefit on Requip DaTscan 08/10/2021 which showed mild decreased activity of posterior putamen and head of the right caudate nucleus, felt this could account for left-sided tremor and suspicion for parkinsonian pathology.      Follow-up in 3-4 months with Dr. Brett Fairy for further medication management regarding vascular parkinsonism or call earlier if needed     CC:  Nafziger, Tommi Rumps, NP    I spent 36 min of face-to-face and non-face-to-face time with patient and husband.  This included previsit chart review, lab review, study  review, electronic health record documentation, patient and husband discussion and education regarding above diagnoses and treatment plan and answered all other questions to patient and husband satisfaction  [name], AGNP-BC  [facility] [address] [place], [place] [address]  Phone [phone] Fax [phone] Note: This document was prepared with digital dictation and possible smart phrase technology. Any transcriptional errors that result from this process are unintentional.

## 2022-04-22 DIAGNOSIS — I4891 Unspecified atrial fibrillation: Secondary | ICD-10-CM

## 2022-04-22 DIAGNOSIS — E039 Hypothyroidism, unspecified: Secondary | ICD-10-CM

## 2022-04-22 DIAGNOSIS — M1990 Unspecified osteoarthritis, unspecified site: Secondary | ICD-10-CM

## 2022-04-22 DIAGNOSIS — Z794 Long term (current) use of insulin: Secondary | ICD-10-CM

## 2022-04-22 DIAGNOSIS — I10 Essential (primary) hypertension: Secondary | ICD-10-CM

## 2022-04-22 DIAGNOSIS — E1169 Type 2 diabetes mellitus with other specified complication: Secondary | ICD-10-CM

## 2022-04-22 DIAGNOSIS — M810 Age-related osteoporosis without current pathological fracture: Secondary | ICD-10-CM

## 2022-04-24 ENCOUNTER — Telehealth: Payer: Self-pay | Admitting: Adult Health

## 2022-04-24 NOTE — Telephone Encounter (Signed)
Contacted pt back, informed her NP recommended she trial the medication for 2 to 3 weeks, then if no benefit can consider dosage increase. She also mentioned just starting to have diarrhea. Advised to contact PCP if it continues, she verbally understood and was appreciative.

## 2022-04-24 NOTE — Telephone Encounter (Signed)
Pt called wanting to update the provider on her trial use of amantadine (SYMMETREL) 100 MG capsule  Pt states that the medication did nothing for her and would like to discuss with NP. Please advise.

## 2022-04-25 ENCOUNTER — Ambulatory Visit (INDEPENDENT_AMBULATORY_CARE_PROVIDER_SITE_OTHER): Payer: Medicare Other | Admitting: Adult Health

## 2022-04-25 ENCOUNTER — Other Ambulatory Visit (HOSPITAL_BASED_OUTPATIENT_CLINIC_OR_DEPARTMENT_OTHER): Payer: Self-pay

## 2022-04-25 ENCOUNTER — Telehealth: Payer: Self-pay | Admitting: Neurology

## 2022-04-25 ENCOUNTER — Ambulatory Visit (INDEPENDENT_AMBULATORY_CARE_PROVIDER_SITE_OTHER): Payer: Medicare Other

## 2022-04-25 VITALS — BP 110/62 | HR 79 | Temp 97.6°F | Wt 170.0 lb

## 2022-04-25 DIAGNOSIS — Z01818 Encounter for other preprocedural examination: Secondary | ICD-10-CM

## 2022-04-25 DIAGNOSIS — B379 Candidiasis, unspecified: Secondary | ICD-10-CM | POA: Diagnosis not present

## 2022-04-25 DIAGNOSIS — R197 Diarrhea, unspecified: Secondary | ICD-10-CM | POA: Diagnosis not present

## 2022-04-25 DIAGNOSIS — R8290 Unspecified abnormal findings in urine: Secondary | ICD-10-CM | POA: Diagnosis not present

## 2022-04-25 MED ORDER — FLUCONAZOLE 150 MG PO TABS
150.0000 mg | ORAL_TABLET | Freq: Every day | ORAL | 0 refills | Status: DC
  Filled 2022-04-25: qty 2, 2d supply, fill #0

## 2022-04-25 NOTE — Telephone Encounter (Signed)
Received a surgical clearance form for the patient from [facility]. Will have [name], NP review and make recommendation

## 2022-04-25 NOTE — Progress Notes (Signed)
Subjective:    Patient ID: Breanna Webster, female    DOB: September 30, 1945, 77 y.o.   MRN: [id number]  HPI 77 year old female who  has a past medical history of Anxiety, Arthritis, Back pain, Blood transfusion without reported diagnosis, CAD (coronary artery disease), Carotid artery stenosis, Chest pain, CHF (congestive heart failure) (HCC), Chronic lower back pain, Cirrhosis ([place]), Colon polyps, Diverticulitis, Diverticulosis, Esophageal thickening, Fatty liver, GERD (gastroesophageal reflux disease), Grave's disease, Heart murmur, History of colonic polyps (05/22/2017), History of hiatal hernia, Hyperlipidemia, Hypertension, Hypothyroidism, IBS (irritable bowel syndrome), Osteopenia, Parkinson's syndrome (04/23/2020), Pulmonary nodules, S/P TAVR (transcatheter aortic valve replacement), Severe aortic stenosis, Shortness of breath on exertion, Stroke ([place]), Thalassemia minor, Thyroid disease, and Type II diabetes mellitus ([place]).  She presents to the office today for for preoperative clearance. She will be having a left reverse TSA done thought [facility].   She is on Eliquis due to history of CVA. She has been stable from a stroke stand point over the last three years without any new or reoccurring stroke symptoms. She has been cleared by Neurology and cardiology  to proceeded with surgical procedure.   Both she and her husband are nervous about the upcoming surgery   Additionally, she has had diarrhea over the last few days, she is unsure of doses caused by medications that neurology placed her on for Parkinson's, biotic she was on for sinusitis, or her stomach is upset.  He does report that since being on antibiotics she has developed itching and burning into her perineal and vaginal area  She would also like to have a UA done due to discolored urine. No other symptoms    Review of Systems See HPI   Past Medical History:  Diagnosis Date   Anxiety    Arthritis    "back" (04/22/2018)    Back pain    Blood transfusion without reported diagnosis    CAD (coronary artery disease)    a. 03/2018 s/p PCI/DES to the RCA (3.0x15 Onyx DES).   Carotid artery stenosis    Mild   Chest pain    CHF (congestive heart failure) (HCC)    Chronic lower back pain    Cirrhosis (HCC)    Colon polyps    Diverticulitis    Diverticulosis    Esophageal thickening    seen on pre TAVR CT scan, also questionable cirrhosis. MRI recommended. Will refer to GI   Fatty liver    GERD (gastroesophageal reflux disease)    Grave's disease    Heart murmur    History of colonic polyps 05/22/2017   History of hiatal hernia    Hyperlipidemia    Hypertension    Hypothyroidism    IBS (irritable bowel syndrome)    Osteopenia    Parkinson's syndrome 04/23/2020   Pulmonary nodules    seen on pre TAVR CT. likley benign. no follow up recommended if pt low risk.   S/P TAVR (transcatheter aortic valve replacement)    Severe aortic stenosis    Shortness of breath on exertion    Stroke (HCC)    Thalassemia minor    Thyroid disease    Type II diabetes mellitus ([place])     Social History   Socioeconomic History   Marital status: Married    Spouse name: Not on file   Number of children: 2   Years of education: Not on file   Highest education level: Not on file  Occupational History   Occupation: Retired-Director  of the [employer]  Tobacco Use   Smoking status: Never    Passive exposure: Never   Smokeless tobacco: Never  Vaping Use   Vaping Use: Never used  Substance and Sexual Activity   Alcohol use: No    Alcohol/week: 0.0 standard drinks of alcohol   Drug use: Never   Sexual activity: Not Currently  Other Topics Concern   Not on file  Social History Narrative   Lives with husband in a one story home.     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   Education: college.   Social Determinants of Health   Financial  Resource Strain: Medium Risk (11/13/2021)   Overall Financial Resource Strain (CARDIA)    Difficulty of Paying Living Expenses: Somewhat hard  Food Insecurity: No Food Insecurity (03/01/2022)   Hunger Vital Sign    Worried About Running Out of Food in the Last Year: Never true    Ran Out of Food in the Last Year: Never true  Transportation Needs: Unknown (03/01/2022)   PRAPARE - Hydrologist (Medical): No    Lack of Transportation (Non-Medical): Not on file  Physical Activity: Inactive (04/03/2021)   Exercise Vital Sign    Days of Exercise per Week: 0 days    Minutes of Exercise per Session: 0 min  Stress: No Stress Concern Present (04/03/2021)   [place]    Feeling of Stress : Not at all  Social Connections: [place] (04/03/2021)   Social Connection and Isolation Panel [NHANES]    Frequency of Communication with Friends and Family: More than three times a week    Frequency of Social Gatherings with Friends and Family: More than three times a week    Attends Religious Services: More than 4 times per year    Active Member of Clubs or Organizations: Yes    Attends Archivist Meetings: More than 4 times per year    Marital Status: Married  Human resources officer Violence: Not At Risk (04/03/2021)   Humiliation, Afraid, Rape, and Kick questionnaire    Fear of Current or Ex-Partner: No    Emotionally Abused: No    Physically Abused: No    Sexually Abused: No    Past Surgical History:  Procedure Laterality Date   60 HOUR [place] STUDY N/A 03/03/2018   Procedure: 24 HOUR [place];  Surgeon: Mauri Pole, MD;  Location: [facility];  Service: Endoscopy;  Laterality: N/A;   AORTIC VALVE REPLACEMENT  06/2018   COLONOSCOPY     COLONOSCOPY W/ BIOPSIES AND POLYPECTOMY     CORONARY ANGIOGRAPHY Right 04/21/2018   Procedure: CORONARY ANGIOGRAPHY (CATH LAB);  Surgeon: [name], MD;  Location: [facility];  Service: Cardiovascular;  Laterality: Right;   CORONARY STENT INTERVENTION N/A 04/22/2018   Procedure: CORONARY STENT INTERVENTION;  Surgeon: [name], MD;  Location: [facility];  Service: Cardiovascular;  Laterality: N/A;   DILATION AND CURETTAGE OF UTERUS     ESOPHAGEAL MANOMETRY N/A 03/03/2018   Procedure: ESOPHAGEAL MANOMETRY (EM);  Surgeon: Mauri Pole, MD;  Location: [facility];  Service: Endoscopy;  Laterality: N/A;   GASTRIC FUNDOPLICATION     HERNIA REPAIR     HYSTEROSCOPY     fibroids   LAPAROSCOPIC CHOLECYSTECTOMY     LAPAROSCOPY     fibroids  NISSEN FUNDOPLICATION  5621H   POLYPECTOMY     RIGHT/LEFT HEART CATH AND CORONARY ANGIOGRAPHY N/A 02/20/2018   Procedure: RIGHT/LEFT HEART CATH AND CORONARY ANGIOGRAPHY;  Surgeon: [name], MD;  Location: [facility];  Service: Cardiovascular;  Laterality: N/A;   TEE WITHOUT CARDIOVERSION N/A 07/08/2018   Procedure: TRANSESOPHAGEAL ECHOCARDIOGRAM (TEE);  Surgeon: Burnell Blanks, MD;  Location: [facility];  Service: Open Heart Surgery;  Laterality: N/A;   TEE WITHOUT CARDIOVERSION  10/07/2018   TEE WITHOUT CARDIOVERSION N/A 10/07/2018   Procedure: TRANSESOPHAGEAL ECHOCARDIOGRAM (TEE);  Surgeon: Jerline Pain, MD;  [facility];  Service: Cardiovascular;  Laterality: N/A;   TONSILLECTOMY     TRANSCATHETER AORTIC VALVE REPLACEMENT, TRANSFEMORAL N/A 07/08/2018   Procedure: TRANSCATHETER AORTIC VALVE REPLACEMENT, TRANSFEMORAL;  Surgeon: Burnell Blanks, MD;  Location: [facility];  Service: Open Heart Surgery;  Laterality: N/A;    Family History  Adopted: Yes  Problem Relation Age of Onset   Healthy Son        x 2   Headache Other        Cluster headaches   Heart failure Mother    Colon cancer Neg Hx    Pancreatic cancer Neg Hx    Rectal cancer Neg Hx    Stomach cancer Neg Hx     No Active Allergies  Current Outpatient Medications on  File Prior to Visit  Medication Sig Dispense Refill   amantadine (SYMMETREL) 100 MG capsule Take 1 capsule (100 mg total) by mouth 2 (two) times daily. 60 capsule 5   amoxicillin-clavulanate (AUGMENTIN) 875-125 MG tablet Take 1 tablet by mouth 2 (two) times daily. 20 tablet 0   apixaban (ELIQUIS) 5 MG TABS tablet Take 1 tablet (5 mg total) by mouth 2 (two) times daily. 180 tablet 3   b complex vitamins capsule Take 1 capsule by mouth daily.     BD PEN NEEDLE NANO 2ND GEN 32G X 4 MM MISC      benzonatate (TESSALON PERLES) 100 MG capsule Take 1 capsule (100 mg total) by mouth 3 (three) times daily as needed for cough. 30 capsule 0   cholecalciferol (VITAMIN D) 25 MCG (1000 UNIT) tablet Take 1,000 Units by mouth daily.     Continuous Blood Gluc Receiver (FREESTYLE LIBRE READER) DEVI Use as directed 1 each 0   Continuous Blood Gluc Sensor (FREESTYLE LIBRE 3 SENSOR) MISC Place one sensor every 14 days 2 each 11   diltiazem (CARDIZEM CD) 120 MG 24 hr capsule Take 1 capsule (120 mg total) by mouth daily. 90 capsule 3   escitalopram (LEXAPRO) 20 MG tablet TAKE ONE TABLET BY MOUTH DAILY 90 tablet 1   Evolocumab (REPATHA SURECLICK) 086 MG/ML SOAJ Inject 1 dose into the skin every 14 (fourteen) days. 6 mL 3   furosemide (LASIX) 20 MG tablet Take 2 tablets ('40mg'$ ) by mouth daily on 9/13, 9/14, and 9/15. Then take 1 tablet ('20mg'$ ) by mouth daily. (Patient taking differently: Take 20 mg by mouth every other day. Take 2 tablets ('40mg'$ ) by mouth daily on 9/13, 9/14, and 9/15. Then take 1 tablet ('20mg'$ ) by mouth daily.) 90 tablet 3   glucose blood (ONETOUCH VERIO) test strip USE TO CHECK BLOOD SUGAR THREE TIMES DAILY 300 strip 4   HYDROcodone-acetaminophen (NORCO) 5-325 MG tablet Take 1 tablet by mouth every 6 (six) hours. 60 tablet 0   insulin NPH Human (HUMULIN N) 100 UNIT/ML injection Inject 20 units under the skin in the morning  and 14 units in evening 20 mL 5   Insulin NPH, Human,, Isophane, (HUMULIN N KWIKPEN)  100 UNIT/ML Kiwkpen Inject 20 units into the skin every morning and 14 units every evening. 15 mL 6   Insulin Pen Needle (UNIFINE PENTIPS) 32G X 4 MM MISC Inject 4 times subcutaneously 400 each 2   insulin regular (HUMULIN R) 100 units/mL injection Inject 10 Units total into the skin 3 (three) times daily before meals. 10 mL 5   Insulin Regular Human (NOVOLIN R FLEXPEN RELION) 100 UNIT/ML KwikPen Inject 5 to 10 units under the skin three times daily. 15 mL 6   Insulin Syringe-Needle U-100 (ULTICARE INSULIN SYRINGE) 31G X 5/16" 1 ML MISC Inject 4 times daily subcutaneously 100 each 1   Lancets (ONETOUCH DELICA PLUS [id number]) MISC Use twice daily 200 each 1   levothyroxine (SYNTHROID) 137 MCG tablet Take 1 tablet by mouth every morning on an empty stomach  BRAND NAME ONLY 90 tablet 1   losartan (COZAAR) 50 MG tablet TAKE ONE TABLET BY MOUTH DAILY 90 tablet 3   meclizine (ANTIVERT) 25 MG tablet Take 1 tablet (25 mg total) by mouth 3 (three) times daily as needed for dizziness. 30 tablet 0   metFORMIN (GLUCOPHAGE-XR) 500 MG 24 hr tablet Take 1 tablet by mouth 2 times daily 180 tablet 2   methocarbamol (ROBAXIN) 500 MG tablet Take 1 tablet (500 mg total) by mouth 4 (four) times daily. 60 tablet 0   Multiple Vitamin (MULITIVITAMIN WITH MINERALS) TABS Take 1 tablet by mouth daily.     nitroGLYCERIN (NITROSTAT) 0.4 MG SL tablet Place 1 tablet (0.4 mg total) under the tongue every 5 (five) minutes as needed for chest pain. 25 tablet 4   NOVOLIN N 100 UNIT/ML injection Inject 28 Units into the skin daily before breakfast. Taking 20 units every morning and 8 units every evening     omeprazole (PRILOSEC) 20 MG capsule Take 1 capsule (20 mg total) by mouth 2 (two) times daily before a meal. (Patient taking differently: Take 20 mg by mouth daily.) 60 capsule 3   ondansetron (ZOFRAN) 4 MG tablet Take 1 tablet (4 mg total) by mouth every 8 (eight) hours as needed for nausea or vomiting. 20 tablet 0   potassium  chloride SA (KLOR-CON M) 20 MEQ tablet Take 1 tablet (20 mEq total) by mouth daily as needed. Take with Furosemide (Lasix). 90 tablet 3   Vibegron (GEMTESA) 75 MG TABS Take 1 tablet by mouth daily. 30 tablet 11   No current facility-administered medications on file prior to visit.    BP 110/62   Pulse 79   Temp 97.6 F (36.4 C)   Wt 170 lb (77.1 kg)   SpO2 99%   BMI 28.29 kg/m       Objective:   Physical Exam Vitals and nursing note reviewed.  Constitutional:      Appearance: Normal appearance.  Cardiovascular:     Rate and Rhythm: Normal rate and regular rhythm.     Pulses: Normal pulses.     Heart sounds: Normal heart sounds.  Pulmonary:     Effort: Pulmonary effort is normal.     Breath sounds: Normal breath sounds.  Skin:    General: Skin is warm and dry.     Capillary Refill: Capillary refill takes less than 2 seconds.  Neurological:     General: No focal deficit present.     Mental Status: She is alert and oriented to person, place,  and time. Mental status is at baseline.     Motor: Tremor present.       Assessment & Plan:  1. Pre-operative clearance - answered questions to the best of my ability about upcoming surgery, attempted to give reassurance  - EKG 12-Lead- Sinus Rhythm  -Left bundle branch block and right axis. -Left atrial enlargement. Rate 79 - consistent with previous EKGS - DG Chest 2 View; Future  2. Yeast infection  - fluconazole (DIFLUCAN) 150 MG tablet; Take 1 tablet (150 mg total) by mouth daily.  Dispense: 2 tablet; Refill: 0  3. Diarrhea, unspecified type - unknown cause. Advised to use pepto Bismol for the next few days  - follow up as needed  4. Urine abnormality  - Urinalysis; Future - Urinalysis  Time spent with patient today was 43 minutes which consisted of chart review, discussing surgical procedure, diarrhea, yeast infections, abnormal urine,  work up, treatment answering questions and documentation.

## 2022-04-25 NOTE — Telephone Encounter (Signed)
Completed the form and submitted again to [facility]. Received confirmation that it went through placed in file to have in case they didn't get it

## 2022-04-25 NOTE — Telephone Encounter (Signed)
Clearance form for left reverse TSA completed on 04/05/2022 and Emma faxed back to them that day (see telephone note).  Surgery date pending at that time. Please see if this was sent by mistake or they need updated form for some reason?

## 2022-04-26 LAB — URINALYSIS, ROUTINE W REFLEX MICROSCOPIC
Bilirubin Urine: NEGATIVE
Hgb urine dipstick: NEGATIVE
Ketones, ur: NEGATIVE
Nitrite: NEGATIVE
Specific Gravity, Urine: 1.01 (ref 1.000–1.030)
Total Protein, Urine: NEGATIVE
Urine Glucose: NEGATIVE
Urobilinogen, UA: 0.2 (ref 0.0–1.0)
pH: 6 (ref 5.0–8.0)

## 2022-04-27 ENCOUNTER — Telehealth: Payer: Self-pay | Admitting: Adult Health

## 2022-04-27 ENCOUNTER — Telehealth: Payer: Self-pay | Admitting: Pharmacist

## 2022-04-27 DIAGNOSIS — R350 Frequency of micturition: Secondary | ICD-10-CM | POA: Diagnosis not present

## 2022-04-27 NOTE — Telephone Encounter (Signed)
Pt is calling and missed a call from office around 1130 am today

## 2022-04-27 NOTE — Telephone Encounter (Signed)
Patient notified of update  and verbalized understanding. 

## 2022-04-27 NOTE — Progress Notes (Unsigned)
Care Coordination Pharmacy Assistant   Patient ID: Breanna Webster, female   DOB: January 16, 1946, 77 y.o.   MRN: [id number]  Reason for Encounter: Disease State   Conditions to be addressed/monitored: HTN and DMII  Recent office visits:  04/25/22 [name], NP  - Patient presented for Pre-Op clearance and other concerns. Prescribed Fluconazole.   04/10/22 Burchette, Alinda Sierras, MD - Patient presented for Acute cough and other concerns. Prescribed Amoxicillin. Prescribed Benzonatate.   Recent consult visits:  04/19/22 [name], NP (Neurology) - Patient presented for History of cardio embolic stroke and other concerns.Prescribed Symmetrel.   04/17/22 Aloha Gell, (OB/GYN ) - Patient presented for GYN exam, no medication changes.   Hospital visits:  None in previous 6 months  Medications: Outpatient Encounter Medications as of 04/27/2022  Medication Sig   amantadine (SYMMETREL) 100 MG capsule Take 1 capsule (100 mg total) by mouth 2 (two) times daily.   amoxicillin-clavulanate (AUGMENTIN) 875-125 MG tablet Take 1 tablet by mouth 2 (two) times daily.   apixaban (ELIQUIS) 5 MG TABS tablet Take 1 tablet (5 mg total) by mouth 2 (two) times daily.   b complex vitamins capsule Take 1 capsule by mouth daily.   BD PEN NEEDLE NANO 2ND GEN 32G X 4 MM MISC    benzonatate (TESSALON PERLES) 100 MG capsule Take 1 capsule (100 mg total) by mouth 3 (three) times daily as needed for cough.   cholecalciferol (VITAMIN D) 25 MCG (1000 UNIT) tablet Take 1,000 Units by mouth daily.   Continuous Blood Gluc Receiver (FREESTYLE LIBRE READER) DEVI Use as directed   Continuous Blood Gluc Sensor (FREESTYLE LIBRE 3 SENSOR) MISC Place one sensor every 14 days   diltiazem (CARDIZEM CD) 120 MG 24 hr capsule Take 1 capsule (120 mg total) by mouth daily.   escitalopram (LEXAPRO) 20 MG tablet TAKE ONE TABLET BY MOUTH DAILY   Evolocumab (REPATHA SURECLICK) 382 MG/ML SOAJ Inject 1 dose into the skin every 14  (fourteen) days.   fluconazole (DIFLUCAN) 150 MG tablet Take 1 tablet (150 mg total) by mouth daily.   furosemide (LASIX) 20 MG tablet Take 2 tablets ('40mg'$ ) by mouth daily on 9/13, 9/14, and 9/15. Then take 1 tablet ('20mg'$ ) by mouth daily. (Patient taking differently: Take 20 mg by mouth every other day. Take 2 tablets ('40mg'$ ) by mouth daily on 9/13, 9/14, and 9/15. Then take 1 tablet ('20mg'$ ) by mouth daily.)   glucose blood (ONETOUCH VERIO) test strip USE TO CHECK BLOOD SUGAR THREE TIMES DAILY   HYDROcodone-acetaminophen (NORCO) 5-325 MG tablet Take 1 tablet by mouth every 6 (six) hours.   insulin NPH Human (HUMULIN N) 100 UNIT/ML injection Inject 20 units under the skin in the morning and 14 units in evening   Insulin NPH, Human,, Isophane, (HUMULIN N KWIKPEN) 100 UNIT/ML Kiwkpen Inject 20 units into the skin every morning and 14 units every evening.   Insulin Pen Needle (UNIFINE PENTIPS) 32G X 4 MM MISC Inject 4 times subcutaneously   insulin regular (HUMULIN R) 100 units/mL injection Inject 10 Units total into the skin 3 (three) times daily before meals.   Insulin Regular Human (NOVOLIN R FLEXPEN RELION) 100 UNIT/ML KwikPen Inject 5 to 10 units under the skin three times daily.   Insulin Syringe-Needle U-100 (ULTICARE INSULIN SYRINGE) 31G X 5/16" 1 ML MISC Inject 4 times daily subcutaneously   Lancets (ONETOUCH DELICA PLUS [id number]) MISC Use twice daily   levothyroxine (SYNTHROID) 137 MCG tablet Take 1 tablet by mouth every  morning on an empty stomach  BRAND NAME ONLY   losartan (COZAAR) 50 MG tablet TAKE ONE TABLET BY MOUTH DAILY   meclizine (ANTIVERT) 25 MG tablet Take 1 tablet (25 mg total) by mouth 3 (three) times daily as needed for dizziness.   metFORMIN (GLUCOPHAGE-XR) 500 MG 24 hr tablet Take 1 tablet by mouth 2 times daily   methocarbamol (ROBAXIN) 500 MG tablet Take 1 tablet (500 mg total) by mouth 4 (four) times daily.   Multiple Vitamin (MULITIVITAMIN WITH MINERALS) TABS Take 1 tablet  by mouth daily.   nitroGLYCERIN (NITROSTAT) 0.4 MG SL tablet Place 1 tablet (0.4 mg total) under the tongue every 5 (five) minutes as needed for chest pain.   NOVOLIN N 100 UNIT/ML injection Inject 28 Units into the skin daily before breakfast. Taking 20 units every morning and 8 units every evening   omeprazole (PRILOSEC) 20 MG capsule Take 1 capsule (20 mg total) by mouth 2 (two) times daily before a meal. (Patient taking differently: Take 20 mg by mouth daily.)   ondansetron (ZOFRAN) 4 MG tablet Take 1 tablet (4 mg total) by mouth every 8 (eight) hours as needed for nausea or vomiting.   potassium chloride SA (KLOR-CON M) 20 MEQ tablet Take 1 tablet (20 mEq total) by mouth daily as needed. Take with Furosemide (Lasix).   Vibegron (GEMTESA) 75 MG TABS Take 1 tablet by mouth daily.   No facility-administered encounter medications on file as of 04/27/2022.   Recent Relevant Labs: Lab Results  Component Value Date/Time   HGBA1C 7.4 (H) 09/08/2020 04:16 AM   HGBA1C 6.5 05/05/2020 12:00 AM   HGBA1C 7.5 (H) 10/22/2018 04:45 AM   MICROALBUR 0.8 10/07/2015 01:30 PM   MICROALBUR 1.1 02/08/2014 02:49 PM    Kidney Function Lab Results  Component Value Date/Time   CREATININE 0.87 02/26/2022 10:37 AM   CREATININE 0.85 02/09/2022 11:37 AM   CREATININE 0.90 01/09/2022 01:55 PM   CREATININE 0.69 09/06/2021 10:05 AM   GFR 79.08 09/22/2021 10:56 AM   GFRNONAA >60 02/26/2022 10:37 AM   GFRAA 99 12/05/2020 12:00 AM   GFRAA >60 08/14/2019 10:25 AM    Current antihyperglycemic regimen:  Novolin N 28 units before breakfast - Appropriate, Effective, Safe, Query accessible Novolin R 10 units in morning and at night - Appropriate, Effective, Safe, Query accessible Metformin XR 500 mg 1 tablet twice daily - Appropriate, Query effective, Safe, Accessible What recent interventions/DTPs have been made to improve glycemic control:  *** Have there been any recent hospitalizations or ED visits since last visit  with CPP? {yes/no:20286} Patient {reports/denies:24182} hypoglycemic symptoms, including {Hypoglycemic Symptoms:3049003} Patient {reports/denies:24182} hyperglycemic symptoms, including {symptoms; hyperglycemia:17903} How often are you checking your blood sugar? {BG Testing frequency:23922} What are your blood sugars ranging?  Fasting: *** Before meals: *** After meals: *** Bedtime: *** During the week, how often does your blood glucose drop below 70? {LowBGfrequency:24142} Are you checking your feet daily/regularly?   Adherence Review: Is the patient currently on a STATIN medication? {yes/no:20286} Is the patient currently on ACE/ARB medication? {yes/no:20286} Does the patient have >5 day gap between last estimated fill dates? {yes/no:20286}   Reviewed chart prior to disease state call. Spoke with patient regarding BP  Recent Office Vitals: BP Readings from Last 3 Encounters:  04/25/22 110/62  04/19/22 121/65  04/10/22 110/60   Pulse Readings from Last 3 Encounters:  04/25/22 79  04/19/22 76  04/10/22 75    Wt Readings from Last 3 Encounters:  04/25/22  170 lb (77.1 kg)  04/19/22 175 lb 3.2 oz (79.5 kg)  04/10/22 171 lb 1.6 oz (77.6 kg)     Kidney Function Lab Results  Component Value Date/Time   CREATININE 0.87 02/26/2022 10:37 AM   CREATININE 0.85 02/09/2022 11:37 AM   CREATININE 0.90 01/09/2022 01:55 PM   CREATININE 0.69 09/06/2021 10:05 AM   GFR 79.08 09/22/2021 10:56 AM   GFRNONAA >60 02/26/2022 10:37 AM   GFRAA 99 12/05/2020 12:00 AM   GFRAA >60 08/14/2019 10:25 AM       Latest Ref Rng & Units 02/26/2022   10:37 AM 02/09/2022   11:37 AM 01/09/2022    1:55 PM  BMP  Glucose 70 - 99 mg/dL 137  165  189   BUN 8 - 23 mg/dL '23  18  23   '$ Creatinine 0.44 - 1.00 mg/dL 0.87  0.85  0.90   BUN/Creat Ratio 12 - '28  21  26   '$ Sodium 135 - 145 mmol/L 137  137  138   Potassium 3.5 - 5.1 mmol/L 4.0  4.0  4.2   Chloride 98 - 111 mmol/L 103  101  100   CO2 22 - 32  mmol/L '24  22  18   '$ Calcium 8.9 - 10.3 mg/dL 11.0  10.6  10.3     Current antihypertensive regimen:  Losartan 50 mg 1 tablet daily - Appropriate, Effective, Safe, Accessible Diltiazem 120 mg 1 capsule daily - Appropriate, Effective, Safe, Accessible How often are you checking your Blood Pressure? {CHL HP BP Monitoring Frequency:[id number]} Current home BP readings: *** What recent interventions/DTPs have been made by any provider to improve Blood Pressure control since last CPP Visit: *** Any recent hospitalizations or ED visits since last visit with CPP? {yes/no:20286} What diet changes have been made to improve Blood Pressure Control?  *** What exercise is being done to improve your Blood Pressure Control?  ***  Adherence Review: Is the patient currently on ACE/ARB medication? {yes/no:20286} Does the patient have >5 day gap between last estimated fill dates? {yes/no:20286}     Care Gaps: Diabetic Urine- Overdue Foot Exam - Overdue A1C - Overdue Eye Exam - Overdue Flu Vaccine - Overdue COVID Booster - Overdue  AWV-04/03/21 BP- 110//62 Lab Results  Component Value Date   HGBA1C 7.4 (H) 09/08/2020    Star Rating Drugs: Losartan 50 mg - last filled 02/10/2022 90 DS at [facility] Metformin 500 mg - last filled 10/212023 90 DS at [facility] Pharmacist Assistant [phone]

## 2022-04-30 ENCOUNTER — Telehealth: Payer: Self-pay | Admitting: Adult Health

## 2022-04-30 NOTE — Telephone Encounter (Signed)
Left message for patient to call back and schedule Medicare Annual Wellness Visit (AWV) either virtually or in office. Left  my Breanna Webster number [phone]   Last AWV 04/03/21 please schedule with Nurse Health Adviser   45 min for awv-i and in office appointments 30 min for awv-s  phone/virtual appointments

## 2022-05-02 ENCOUNTER — Other Ambulatory Visit (HOSPITAL_BASED_OUTPATIENT_CLINIC_OR_DEPARTMENT_OTHER): Payer: Self-pay

## 2022-05-02 DIAGNOSIS — Z23 Encounter for immunization: Secondary | ICD-10-CM | POA: Diagnosis not present

## 2022-05-02 MED ORDER — INFLUENZA VAC A&B SA ADJ QUAD 0.5 ML IM PRSY
PREFILLED_SYRINGE | INTRAMUSCULAR | 0 refills | Status: DC
  Filled 2022-05-02: qty 0.5, 1d supply, fill #0

## 2022-05-03 ENCOUNTER — Other Ambulatory Visit: Payer: Self-pay | Admitting: Obstetrics

## 2022-05-03 ENCOUNTER — Other Ambulatory Visit (HOSPITAL_BASED_OUTPATIENT_CLINIC_OR_DEPARTMENT_OTHER): Payer: Self-pay

## 2022-05-03 ENCOUNTER — Telehealth: Payer: Self-pay | Admitting: Adult Health

## 2022-05-03 DIAGNOSIS — Z1231 Encounter for screening mammogram for malignant neoplasm of breast: Secondary | ICD-10-CM

## 2022-05-03 DIAGNOSIS — M19012 Primary osteoarthritis, left shoulder: Secondary | ICD-10-CM | POA: Diagnosis not present

## 2022-05-03 MED ORDER — ROPINIROLE HCL 0.25 MG PO TABS
0.2500 mg | ORAL_TABLET | Freq: Three times a day (TID) | ORAL | 3 refills | Status: DC
  Filled 2022-05-03: qty 90, 30d supply, fill #0

## 2022-05-03 NOTE — Addendum Note (Signed)
Addended by: [name] L on: 05/03/2022 04:40 PM   Modules accepted: Orders

## 2022-05-03 NOTE — Telephone Encounter (Signed)
Pt called on 1/2 stating that the medication did nothing for her. I advised her, per notes, to trial the medication for 2 to 3 weeks, then if no benefit can consider dosage increase. Pt Is returning call again stating its still no benefit. Would you like to increase?

## 2022-05-03 NOTE — Telephone Encounter (Signed)
As she has not noticed any benefit with use of amantadine, would recommend switching to a different medication at this time. Recommend trying ropinirole 0.'25mg'$  three times daily. If no benefit after 1 week, patient can call or send MyChart message to discuss dosage increase. If medication makes her drowsy, dizzy or nauseous, recommend decreasing frequency to 1 tablet at night for a couple days and gradually increase up to 3 times per day. A new prescription will be sent to her pharmacy.

## 2022-05-03 NOTE — Telephone Encounter (Signed)
Pt has called to report that the medication amantadine (SYMMETREL) 100 MG capsule , there has been no adverse reaction.  Pt also states that her urine is now an orange like color.  Please call pt to discuss.

## 2022-05-03 NOTE — Telephone Encounter (Signed)
Contacted pt back, spouse answered and said she was unavailable and to call back tomorrow. Will just send [facility].

## 2022-05-04 DIAGNOSIS — R350 Frequency of micturition: Secondary | ICD-10-CM | POA: Diagnosis not present

## 2022-05-08 NOTE — Telephone Encounter (Signed)
At 2:38 this afternoon pt left a vm asking for a call back to discuss the medication not working.  Phone rep called and spoke with spouse who stated pt was resting, he is asking RN give pt a call on tomorrow, please call pt on her mobile#[phone]

## 2022-05-09 ENCOUNTER — Other Ambulatory Visit (HOSPITAL_BASED_OUTPATIENT_CLINIC_OR_DEPARTMENT_OTHER): Payer: Self-pay

## 2022-05-09 NOTE — Telephone Encounter (Signed)
Contacted pt back, went over Breanna Webster's recommendations. She stated she never did start Ropinirole and declined wanting to try it due to side effect. She is wanting something else with less side effects because she already takes medications that makes her drowsy and dizzy. What else do you recommend? Please address upon your return.

## 2022-05-09 NOTE — Progress Notes (Signed)
Sent message, via epic in basket, requesting order in epic from surgeon    05/09/22 1006  Preop Orders  Has preop orders? No  Name of staff/physician contacted for orders(Indicate phone or IB message) Dixon, T. PA-C.

## 2022-05-09 NOTE — Patient Instructions (Signed)
SURGICAL WAITING ROOM VISITATION Patients having surgery or a procedure may have no more than 2 support people in the waiting area - these visitors may rotate.    If the patient needs to stay at the hospital during part of their recovery, the visitor guidelines for inpatient rooms apply. Pre-op nurse will coordinate an appropriate time for 1 support person to accompany patient in pre-op.  This support person may not rotate.    Please refer to the [facility] website for the visitor guidelines for Inpatients (after your surgery is over and you are in a regular room).   Due to an increase in RSV and influenza rates and associated hospitalizations, children ages 51 and under may not visit patients in [place].     Your procedure is scheduled on: 06-01-22   Report to [facility] Main Entrance    Report to admitting at 12:00 PM   Call this number if you have problems the morning of surgery [phone]   Do not eat food :After Midnight.   After Midnight you may have the following liquids until 11:30 AM/ DAY OF SURGERY  Water Non-Citrus Juices (without pulp, NO RED) Carbonated Beverages Black Coffee (NO MILK/CREAM OR CREAMERS, sugar ok)  Clear Tea (NO MILK/CREAM OR CREAMERS, sugar ok) regular and decaf                             Plain Jell-O (NO RED)                                           Fruit ices (not with fruit pulp, NO RED)                                     Popsicles (NO RED)                                                               Sports drinks like Gatorade (NO RED)                   The day of surgery:  Drink ONE (1) Pre-Surgery G2 at 11:30 AM the morning of surgery. Drink in one sitting. Do not sip.  This drink was given to you during your hospital  pre-op appointment visit. Nothing else to drink after completing the Pre-Surgery  G2.          If you have questions, please contact your surgeon's office.   FOLLOW ANY ADDITIONAL PRE OP  INSTRUCTIONS YOU RECEIVED FROM YOUR SURGEON'S OFFICE!!!     Oral Hygiene is also important to reduce your risk of infection.                                    Remember - BRUSH YOUR TEETH THE MORNING OF SURGERY WITH YOUR REGULAR TOOTHPASTE   Do NOT smoke after Midnight   Take these medicines the morning of surgery with A SIP OF WATER:   Diltiazem  Escitalopram  Levothyroxine  Omeprazole   Hydrocodone if needed  Meclizine if needed  How to Manage Your Diabetes Before and After Surgery  Why is it important to control my blood sugar before and after surgery? Improving blood sugar levels before and after surgery helps healing and can limit problems. A way of improving blood sugar control is eating a healthy diet by:  Eating less sugar and carbohydrates  Increasing activity/exercise  Talking with your doctor about reaching your blood sugar goals High blood sugars (greater than 180 mg/dL) can raise your risk of infections and slow your recovery, so you will need to focus on controlling your diabetes during the weeks before surgery. Make sure that the doctor who takes care of your diabetes knows about your planned surgery including the date and location.  How do I manage my blood sugar before surgery? Check your blood sugar at least 4 times a day, starting 2 days before surgery, to make sure that the level is not too high or low. Check your blood sugar the morning of your surgery when you wake up and every 2 hours until you get to the Short Stay unit. If your blood sugar is less than 70 mg/dL, you will need to treat for low blood sugar: Do not take insulin. Treat a low blood sugar (less than 70 mg/dL) with  cup of clear juice (cranberry or apple), 4 glucose tablets, OR glucose gel. Recheck blood sugar in 15 minutes after treatment (to make sure it is greater than 70 mg/dL). If your blood sugar is not greater than 70 mg/dL on recheck, call [phone] for further instructions. Report  your blood sugar to the short stay nurse when you get to Short Stay.  If you are admitted to the hospital after surgery: Your blood sugar will be checked by the staff and you will probably be given insulin after surgery (instead of oral diabetes medicines) to make sure you have good blood sugar levels. The goal for blood sugar control after surgery is 80-180 mg/dL.   WHAT DO I DO ABOUT MY DIABETES MEDICATION?  Do not take oral diabetes medicines (pills) the morning of surgery.  THE NIGHT BEFORE SURGERY:  Take 50% of NPH insulin                                 Do not take a bedtime dose of Regular insulin      THE MORNING OF SURGERY:  Take 50% of NPH Insulin                   Take 50% of Regular insulin if CBG 220 or higher.  DO NOT TAKE THE FOLLOWING 7 DAYS PRIOR TO SURGERY: Ozempic, Wegovy, Rybelsus (Semaglutide), Byetta (exenatide), Bydureon (exenatide ER), Victoza, Saxenda (liraglutide), or Trulicity (dulaglutide) Mounjaro (Tirzepatide) Adlyxin (Lixisenatide), Polyethylene Glycol Loxenatide.  Reviewed and Endorsed by [facility] Patient Education Committee, August 2015  Bring CPAP mask and tubing day of surgery.                              You may not have any metal on your body including hair pins, jewelry, and body piercing             Do not wear make-up, lotions, powders, perfumes or deodorant  Do not wear nail polish including gel and S&S, artificial/acrylic nails, or any  other type of covering on natural nails including finger and toenails. If you have artificial nails, gel coating, etc. that needs to be removed by a nail salon please have this removed prior to surgery or surgery may need to be canceled/ delayed if the surgeon/ anesthesia feels like they are unable to be safely monitored.   Do not shave  48 hours prior to surgery.    Do not bring valuables to the hospital. [place].   Contacts, dentures or bridgework may not be worn  into surgery.   Bring small overnight bag day of surgery.   DO NOT [place]. PHARMACY WILL DISPENSE MEDICATIONS LISTED ON YOUR MEDICATION LIST TO YOU DURING YOUR ADMISSION [place]!     Special Instructions: Bring a copy of your healthcare power of attorney and living will documents the day of surgery if you haven't scanned them before.              Please read over the following fact sheets you were given: IF [place] [phone]  If you received a COVID test during your pre-op visit  it is requested that you wear a mask when out in public, stay away from anyone that may not be feeling well and notify your surgeon if you develop symptoms. If you test positive for Covid or have been in contact with anyone that has tested positive in the last 10 days please notify you surgeon.  [facility] - Preparing for Surgery Before surgery, you can play an important role.  Because skin is not sterile, your skin needs to be as free of germs as possible.  You can reduce the number of germs on your skin by washing with CHG (chlorahexidine gluconate) soap before surgery.  CHG is an antiseptic cleaner which kills germs and bonds with the skin to continue killing germs even after washing. Please DO NOT use if you have an allergy to CHG or antibacterial soaps.  If your skin becomes reddened/irritated stop using the CHG and inform your nurse when you arrive at Short Stay. Do not shave (including legs and underarms) for at least 48 hours prior to the first CHG shower.  You may shave your face/neck.  Please follow these instructions carefully:  1.  Shower with CHG Soap the night before surgery and the  morning of surgery.  2.  If you choose to wash your hair, wash your hair first as usual with your normal  shampoo.  3.  After you shampoo, rinse your hair and body thoroughly to remove the shampoo.                              4.  Use CHG as you would any other liquid soap.  You can apply chg directly to the skin and wash.  Gently with a scrungie or clean washcloth.  5.  Apply the CHG Soap to your body ONLY FROM THE NECK DOWN.   Do   not use on face/ open                           Wound or open sores. Avoid contact with eyes, ears mouth and   genitals (private parts).  Wash face,  Genitals (private parts) with your normal soap.             6.  Wash thoroughly, paying special attention to the area where your    surgery  will be performed.  7.  Thoroughly rinse your body with warm water from the neck down.  8.  DO NOT shower/wash with your normal soap after using and rinsing off the CHG Soap.                9.  Pat yourself dry with a clean towel.            10.  Wear clean pajamas.            11.  Place clean sheets on your bed the night of your first shower and do not  sleep with pets. Day of Surgery : Do not apply any lotions/deodorants the morning of surgery.  Please wear clean clothes to the hospital/surgery center.  FAILURE TO FOLLOW THESE INSTRUCTIONS MAY RESULT IN THE CANCELLATION OF YOUR SURGERY  PATIENT SIGNATURE_________________________________  NURSE SIGNATURE__________________________________  ________________________________________________________________________    Adam Phenix  An incentive spirometer is a tool that can help keep your lungs clear and active. This tool measures how well you are filling your lungs with each breath. Taking long deep breaths may help reverse or decrease the chance of developing breathing (pulmonary) problems (especially infection) following: A long period of time when you are unable to move or be active. BEFORE THE PROCEDURE  If the spirometer includes an indicator to show your best effort, your nurse or respiratory therapist will set it to a desired goal. If possible, sit up straight or lean slightly forward. Try not to slouch. Hold  the incentive spirometer in an upright position. INSTRUCTIONS FOR USE  Sit on the edge of your bed if possible, or sit up as far as you can in bed or on a chair. Hold the incentive spirometer in an upright position. Breathe out normally. Place the mouthpiece in your mouth and seal your lips tightly around it. Breathe in slowly and as deeply as possible, raising the piston or the ball toward the top of the column. Hold your breath for 3-5 seconds or for as long as possible. Allow the piston or ball to fall to the bottom of the column. Remove the mouthpiece from your mouth and breathe out normally. Rest for a few seconds and repeat Steps 1 through 7 at least 10 times every 1-2 hours when you are awake. Take your time and take a few normal breaths between deep breaths. The spirometer may include an indicator to show your best effort. Use the indicator as a goal to work toward during each repetition. After each set of 10 deep breaths, practice coughing to be sure your lungs are clear. If you have an incision (the cut made at the time of surgery), support your incision when coughing by placing a pillow or rolled up towels firmly against it. Once you are able to get out of bed, walk around indoors and cough well. You may stop using the incentive spirometer when instructed by your caregiver.  RISKS AND COMPLICATIONS Take your time so you do not get dizzy or light-headed. If you are in pain, you may need to take or ask for pain medication before doing incentive spirometry. It is harder to take a deep breath if you are having pain. AFTER USE Rest and breathe slowly and easily. It can be helpful  to keep track of a log of your progress. Your caregiver can provide you with a simple table to help with this. If you are using the spirometer at home, follow these instructions: [place] IF:  You are having difficultly using the spirometer. You have trouble using the spirometer as often as  instructed. Your pain medication is not giving enough relief while using the spirometer. You develop fever of 100.5 F (38.1 C) or higher. SEEK IMMEDIATE MEDICAL CARE IF:  You cough up bloody sputum that had not been present before. You develop fever of 102 F (38.9 C) or greater. You develop worsening pain at or near the incision site. MAKE SURE YOU:  Understand these instructions. Will watch your condition. Will get help right away if you are not doing well or get worse. Document Released: 08/20/2006 Document Revised: 07/02/2011 Document Reviewed: 10/21/2006 [facility] Patient Information 2014 [place], [place].   ________________________________________________________________________

## 2022-05-10 ENCOUNTER — Other Ambulatory Visit (HOSPITAL_BASED_OUTPATIENT_CLINIC_OR_DEPARTMENT_OTHER): Payer: Self-pay

## 2022-05-10 ENCOUNTER — Encounter: Payer: Self-pay | Admitting: Hematology

## 2022-05-11 ENCOUNTER — Other Ambulatory Visit (HOSPITAL_BASED_OUTPATIENT_CLINIC_OR_DEPARTMENT_OTHER): Payer: Self-pay

## 2022-05-11 DIAGNOSIS — R350 Frequency of micturition: Secondary | ICD-10-CM | POA: Diagnosis not present

## 2022-05-13 NOTE — Progress Notes (Signed)
05/13/2022 Breanna Webster [id number] [birth year]   Chief Complaint: Discuss upper endoscopy and colonoscopy results   History of Present Illness: Breanna Webster. [name] is a 77 year old female with a past medical history of  hypertension, coronary artery disease s/p DES 5784, chronic diastolic CHF, s/p TAVR, paroxysmal atrial fibrillation, microcytic anemia, DM II, CVA, chronic imbalance since CVA with recent fall seen in the ED 04/05/2021, Graves' disease/hypothyroidism, Barrett's esophagus, GERD, past Nissen fundoplication surgery and NASH cirrhosis. She is followed by Dr. Havery Moros.  She presents today accompanied by her husband to further discuss her EGD and colonoscopy results.  She underwent a surveillance EGD 03/07/2022 which showed a normal esophagus without evidence of Barrett's esophagus and no evidence of esophageal or gastric varices.  Prior [employer] fundoplication was intact.  A repeat EGD in a few years was considered.  The colonoscopy identified 6 tubular adenomatous/hyperplastic polyps removed from the colon, diverticulosis throughout the colon and internal hemorrhoids.  No further colon polyp surveillance colonoscopies were recommended due to age.  She underwent a surveillance RUQ sonogram 04/09/2022 which was consistent with cirrhosis without evidence of a hepatoma.  AFP 2.4 on 04/06/2022.  She denies having any dysphagia or heartburn.  She is taking Omeprazole 20 mg once daily.  No upper or lower abdominal pain.  She is passing normal formed brown bowel movement most days.  No recent diarrhea issues.  No rectal bleeding or black stools.  No chest pain or palpitations.  No issues with confusion.  She is scheduled to for left reverse shoulder arthroplasty on 06/01/2022.       Latest Ref Rng & Units 02/26/2022   10:37 AM 02/06/2022   12:17 PM 09/22/2021   10:56 AM  CBC  WBC 4.0 - 10.5 K/uL 7.5  7.3  6.8   Hemoglobin 12.0 - 15.0 g/dL 11.7  11.0  10.9   Hematocrit 36.0 - 46.0 % 36.9  35.3   34.9   Platelets 150 - 400 K/uL 159  202.0  173.0        Latest Ref Rng & Units 04/06/2022    3:20 PM 02/26/2022   10:37 AM 02/09/2022   11:37 AM  CMP  Glucose 70 - 99 mg/dL  137  165   BUN 8 - 23 mg/dL  23  18   Creatinine 0.44 - 1.00 mg/dL  0.87  0.85   Sodium 135 - 145 mmol/L  137  137   Potassium 3.5 - 5.1 mmol/L  4.0  4.0   Chloride 98 - 111 mmol/L  103  101   CO2 22 - 32 mmol/L  24  22   Calcium 8.9 - 10.3 mg/dL  11.0  10.6   Total Protein 6.0 - 8.3 g/dL 6.9  7.7    Total Bilirubin 0.2 - 1.2 mg/dL 0.8  1.2    Alkaline Phos 39 - 117 U/L 74  86    AST 0 - 37 U/L 20  27    ALT 0 - 35 U/L 27  32      AFP 2.4 on 04/06/2022  RUQ sonogram 04/09/2022:  FINDINGS: Gallbladder: Surgically absent   Common bile duct: Diameter: 5.8 mm   Liver: Coarsened echogenicity and nodular contour. No focal lesion. Portal vein is patent on color Doppler imaging with normal direction of blood flow towards the liver.   IMPRESSION: Morphologic changes compatible with cirrhosis. No focal lesion.  GI PROCEDURES:  EGD 03/07/2022:   Colonoscopy 03/07/2022:  1. Surgical [  P], GE junction - SUPERFICIAL FRAGMENT OF GASTRIC CARDIAC MUCOSA WITH NO SIGNIFICANT PATHOLOGIC CHANGES - NEGATIVE FOR INTESTINAL METAPLASIA, DYSPLASIA OR MALIGNANCY 2. Surgical [P], colon, transverse x 4 and cecum x 1, descending x 1, polyp (6) - TUBULAR ADENOMA(S). - NO HIGH GRADE DYSPLASIA OR MALIGNANCY. - HYPERPLASTIC POLYP(S). - NO DYSPLASIA OR MALIGNANCY. 3. Surgical [P], random sites colon - BENIGN COLONIC MUCOSA WITH NO SPECIFIC PATHOLOGIC CHANGES - NEGATIVE FOR INCREASED INTRAEPITHELIAL LYMPHOCYTES OR THICKENED SUBEPITHELIAL COLLAGEN TABLE - NEGATIVE FOR DYSPLASIA OR MALIGNANCY  Past Medical History:  Diagnosis Date   Anxiety    Arthritis    "back" (04/22/2018)   Back pain    Blood transfusion without reported diagnosis    CAD (coronary artery disease)    a. 03/2018 s/p PCI/DES to the RCA (3.0x15 Onyx  DES).   Carotid artery stenosis    Mild   Chest pain    CHF (congestive heart failure) (HCC)    Chronic lower back pain    Cirrhosis (HCC)    Colon polyps    Diverticulitis    Diverticulosis    Esophageal thickening    seen on pre TAVR CT scan, also questionable cirrhosis. MRI recommended. Will refer to GI   Fatty liver    GERD (gastroesophageal reflux disease)    Grave's disease    Heart murmur    History of colonic polyps 05/22/2017   History of hiatal hernia    Hyperlipidemia    Hypertension    Hypothyroidism    IBS (irritable bowel syndrome)    Osteopenia    Parkinson's syndrome 04/23/2020   Pulmonary nodules    seen on pre TAVR CT. likley benign. no follow up recommended if pt low risk.   S/P TAVR (transcatheter aortic valve replacement)    Severe aortic stenosis    Shortness of breath on exertion    Stroke ([place])    Thalassemia minor    Thyroid disease    Type II diabetes mellitus ([place])    Past Surgical History:  Procedure Laterality Date   65 HOUR [place] STUDY N/A 03/03/2018   Procedure: 24 HOUR PH STUDY;  Surgeon: Mauri Pole, MD;  Location: [facility];  Service: Endoscopy;  Laterality: N/A;   AORTIC VALVE REPLACEMENT  06/2018   COLONOSCOPY     COLONOSCOPY W/ BIOPSIES AND POLYPECTOMY     CORONARY ANGIOGRAPHY Right 04/21/2018   Procedure: CORONARY ANGIOGRAPHY (CATH LAB);  Surgeon: [name], MD;  Location: [place] [facility];  Service: Cardiovascular;  Laterality: Right;   CORONARY STENT INTERVENTION N/A 04/22/2018   Procedure: CORONARY STENT INTERVENTION;  Surgeon: [name], MD;  Location: [place] [facility];  Service: Cardiovascular;  Laterality: N/A;   DILATION AND CURETTAGE OF UTERUS     ESOPHAGEAL MANOMETRY N/A 03/03/2018   Procedure: ESOPHAGEAL MANOMETRY (EM);  Surgeon: Mauri Pole, MD;  Location: [facility];  Service: Endoscopy;  Laterality: N/A;   GASTRIC FUNDOPLICATION     HERNIA REPAIR     HYSTEROSCOPY     fibroids    LAPAROSCOPIC CHOLECYSTECTOMY     LAPAROSCOPY     fibroids   NISSEN FUNDOPLICATION  9381O   POLYPECTOMY     RIGHT/LEFT HEART CATH AND CORONARY ANGIOGRAPHY N/A 02/20/2018   Procedure: RIGHT/LEFT HEART CATH AND CORONARY ANGIOGRAPHY;  Surgeon: [name], MD;  Location: [place] [facility];  Service: Cardiovascular;  Laterality: N/A;   TEE WITHOUT CARDIOVERSION N/A 07/08/2018   Procedure: TRANSESOPHAGEAL ECHOCARDIOGRAM (TEE);  Surgeon:  Burnell Blanks, MD;  Location: [place];  Service: Open Heart Surgery;  Laterality: N/A;   TEE WITHOUT CARDIOVERSION  10/07/2018   TEE WITHOUT CARDIOVERSION N/A 10/07/2018   Procedure: TRANSESOPHAGEAL ECHOCARDIOGRAM (TEE);  Surgeon: Jerline Pain, MD;  Location: [facility] ENDOSCOPY;  Service: Cardiovascular;  Laterality: N/A;   TONSILLECTOMY     TRANSCATHETER AORTIC VALVE REPLACEMENT, TRANSFEMORAL N/A 07/08/2018   Procedure: TRANSCATHETER AORTIC VALVE REPLACEMENT, TRANSFEMORAL;  Surgeon: Burnell Blanks, MD;  Location: [place];  Service: Open Heart Surgery;  Laterality: N/A;   Current Outpatient Medications on File Prior to Visit  Medication Sig Dispense Refill   amoxicillin-clavulanate (AUGMENTIN) 875-125 MG tablet Take 1 tablet by mouth 2 (two) times daily. 20 tablet 0   apixaban (ELIQUIS) 5 MG TABS tablet Take 1 tablet (5 mg total) by mouth 2 (two) times daily. 180 tablet 3   b complex vitamins capsule Take 1 capsule by mouth daily.     BD PEN NEEDLE NANO 2ND GEN 32G X 4 MM MISC      benzonatate (TESSALON PERLES) 100 MG capsule Take 1 capsule (100 mg total) by mouth 3 (three) times daily as needed for cough. 30 capsule 0   cholecalciferol (VITAMIN D) 25 MCG (1000 UNIT) tablet Take 1,000 Units by mouth daily.     Continuous Blood Gluc Receiver (FREESTYLE LIBRE READER) DEVI Use as directed 1 each 0   Continuous Blood Gluc Sensor (FREESTYLE LIBRE 3 SENSOR) MISC Place one sensor every 14 days 2 each 11   diltiazem (CARDIZEM CD) 120 MG 24 hr capsule Take 1  capsule (120 mg total) by mouth daily. 90 capsule 3   escitalopram (LEXAPRO) 20 MG tablet TAKE ONE TABLET BY MOUTH DAILY 90 tablet 1   Evolocumab (REPATHA SURECLICK) 810 MG/ML SOAJ Inject 1 dose into the skin every 14 (fourteen) days. 6 mL 3   fluconazole (DIFLUCAN) 150 MG tablet Take 1 tablet (150 mg total) by mouth daily. 2 tablet 0   furosemide (LASIX) 20 MG tablet Take 2 tablets ('40mg'$ ) by mouth daily on 9/13, 9/14, and 9/15. Then take 1 tablet ('20mg'$ ) by mouth daily. (Patient taking differently: Take 20 mg by mouth every other day. Take 2 tablets ('40mg'$ ) by mouth daily on 9/13, 9/14, and 9/15. Then take 1 tablet ('20mg'$ ) by mouth daily.) 90 tablet 3   glucose blood (ONETOUCH VERIO) test strip USE TO CHECK BLOOD SUGAR THREE TIMES DAILY 300 strip 4   HYDROcodone-acetaminophen (NORCO) 5-325 MG tablet Take 1 tablet by mouth every 6 (six) hours. 60 tablet 0   influenza vaccine adjuvanted (FLUAD) 0.5 ML injection Inject into the muscle. 0.5 mL 0   insulin NPH Human (HUMULIN N) 100 UNIT/ML injection Inject 20 units under the skin in the morning and 14 units in evening 20 mL 5   Insulin NPH, Human,, Isophane, (HUMULIN N KWIKPEN) 100 UNIT/ML Kiwkpen Inject 20 units into the skin every morning and 14 units every evening. 15 mL 6   Insulin Pen Needle (UNIFINE PENTIPS) 32G X 4 MM MISC Inject 4 times subcutaneously 400 each 2   insulin regular (HUMULIN R) 100 units/mL injection Inject 10 Units total into the skin 3 (three) times daily before meals. 10 mL 5   Insulin Regular Human (NOVOLIN R FLEXPEN RELION) 100 UNIT/ML KwikPen Inject 5 to 10 units under the skin three times daily. 15 mL 6   Insulin Syringe-Needle U-100 (ULTICARE INSULIN SYRINGE) 31G X 5/16" 1 ML MISC Inject 4 times daily subcutaneously 100 each  1   Lancets (ONETOUCH DELICA PLUS [id number]) MISC Use twice daily 200 each 1   levothyroxine (SYNTHROID) 137 MCG tablet Take 1 tablet by mouth every morning on an empty stomach  BRAND NAME ONLY 90 tablet 1    losartan (COZAAR) 50 MG tablet TAKE ONE TABLET BY MOUTH DAILY 90 tablet 3   meclizine (ANTIVERT) 25 MG tablet Take 1 tablet (25 mg total) by mouth 3 (three) times daily as needed for dizziness. 30 tablet 0   metFORMIN (GLUCOPHAGE-XR) 500 MG 24 hr tablet Take 1 tablet by mouth 2 times daily 180 tablet 2   methocarbamol (ROBAXIN) 500 MG tablet Take 1 tablet (500 mg total) by mouth 4 (four) times daily. 60 tablet 0   Multiple Vitamin (MULITIVITAMIN WITH MINERALS) TABS Take 1 tablet by mouth daily.     nitroGLYCERIN (NITROSTAT) 0.4 MG SL tablet Place 1 tablet (0.4 mg total) under the tongue every 5 (five) minutes as needed for chest pain. 25 tablet 4   NOVOLIN N 100 UNIT/ML injection Inject 28 Units into the skin daily before breakfast. Taking 20 units every morning and 8 units every evening     omeprazole (PRILOSEC) 20 MG capsule Take 1 capsule (20 mg total) by mouth 2 (two) times daily before a meal. (Patient taking differently: Take 20 mg by mouth daily.) 60 capsule 3   ondansetron (ZOFRAN) 4 MG tablet Take 1 tablet (4 mg total) by mouth every 8 (eight) hours as needed for nausea or vomiting. 20 tablet 0   potassium chloride SA (KLOR-CON M) 20 MEQ tablet Take 1 tablet (20 mEq total) by mouth daily as needed. Take with Furosemide (Lasix). 90 tablet 3   rOPINIRole (REQUIP) 0.25 MG tablet Take 1 tablet (0.25 mg total) by mouth 3 (three) times daily. 90 tablet 3   Vibegron (GEMTESA) 75 MG TABS Take 1 tablet by mouth daily. 30 tablet 11   No current facility-administered medications on file prior to visit.   No Active Allergies  Current Medications, Allergies, Past Medical History, Past Surgical History, Family History and Social History were reviewed in [employer] record.  Review of Systems:   Constitutional: Negative for fever, sweats, chills or weight loss.  Respiratory: Negative for shortness of breath.   Cardiovascular: Negative for chest pain, palpitations and leg  swelling.  Gastrointestinal: See HPI.  Musculoskeletal: + Left shoulder pain.  Neurological: Negative for dizziness, headaches or paresthesias.    Physical Exam: BP 112/62   Pulse 78   Ht '5\' 5"'$  (1.651 m)   Wt 174 lb 4 oz (79 kg)   BMI 29.00 kg/m   General: 77 year old female in no acute distress. Head: Normocephalic and atraumatic. Eyes: No scleral icterus. Conjunctiva pink . Ears: Normal auditory acuity. Mouth: Dentition intact. No ulcers or lesions.  Lungs: Clear throughout to auscultation. Heart: Regular rate and rhythm. Systolic murmur.  Abdomen: Soft, obese abdomen. Nontender and nondistended. No masses or hepatomegaly.  No ascites.  Normal bowel sounds x 4 quadrants.  Rectal: Deferred. Musculoskeletal: Symmetrical with no gross deformities. Extremities: No edema. Neurological: Alert oriented x 4. No focal deficits.  Psychological: Alert and cooperative. Normal mood and affect  Assessment and Recommendations:  9) 77 year old female with metabolic dysfunction associated steatohepatitis (MASH) with compensated cirrhosis.  EGD 02/2022 without esophageal or gastric varices.  RUQ sonogram 03/2022 without hepatoma. -RUQ sonogram and AFP Q 6 months, next due June 2024 -CBC, CMP an INR June 2024 -Low-sodium diet -Avoid weight gain -Consider surveillance EGD  02/2024  2) History of GERD and Barrett's esophagus.  EGD 02/2022 without evidence of Barrett's esophagus. -Continue Omeprazole 20 mg 1 p.o. daily -EGD 02/2022 procedure and biopsy results will reviewed in full detail with the patient and spouse  3) History of colon polyps.  Most recent colonoscopy 03/07/2022 identified 6 tubular adenomatous/hyperplastic polyps removed from the colon, diverticulosis throughout the colon and internal hemorrhoids.  No further colon polyp surveillance colonoscopies recommended due to age.  -Colonoscopy 02/2022 procedure and biopsy results were reviewed in full detail with the patient and  spouse  4) Paroxysmal atrial fibrillation on Eliquis  5) Chronic heart failure with preserved ejection fraction  6) S/P TAVR   7) DM II  8) Past CVA

## 2022-05-14 ENCOUNTER — Encounter: Payer: Self-pay | Admitting: Nurse Practitioner

## 2022-05-14 ENCOUNTER — Ambulatory Visit (INDEPENDENT_AMBULATORY_CARE_PROVIDER_SITE_OTHER): Payer: Medicare Other | Admitting: Nurse Practitioner

## 2022-05-14 VITALS — BP 112/62 | HR 78 | Ht 65.0 in | Wt 174.2 lb

## 2022-05-14 DIAGNOSIS — K7460 Unspecified cirrhosis of liver: Secondary | ICD-10-CM | POA: Diagnosis not present

## 2022-05-14 DIAGNOSIS — K7581 Nonalcoholic steatohepatitis (NASH): Secondary | ICD-10-CM

## 2022-05-14 DIAGNOSIS — K219 Gastro-esophageal reflux disease without esophagitis: Secondary | ICD-10-CM

## 2022-05-14 DIAGNOSIS — Z86010 Personal history of colonic polyps: Secondary | ICD-10-CM | POA: Diagnosis not present

## 2022-05-14 NOTE — Progress Notes (Signed)
Second request for orders in [facility]: Left a voicemail with Megan at Dr. Veverly Fells' office.

## 2022-05-14 NOTE — Patient Instructions (Addendum)
Imodium- 1 by mouth twice daily for loose stools (over the counter)  Follow up labs due June 2024.  Follow up with Dr.[name] June 2024 after labs are done.  Thank you for trusting me with your gastrointestinal care!   Carl Best, CRNP

## 2022-05-14 NOTE — Telephone Encounter (Signed)
Patient with hx of PD with greatest symptom complaint of tremor. Tried Sinemet but c/o side effects. Tried amantadine but denied any benefit on '100mg'$  BID dosing. Previously tried Requip but denied benefit therefore self discontinued, recommend retrying with plans on gradually increasing if needed but patient refusing to retry due to concern of potential side effects. Any other medication recommendations at this time that comes with least side effects? Thank you!

## 2022-05-15 NOTE — H&P (Signed)
Patient's anticipated LOS is less than 2 midnights, meeting these requirements: - Younger than 66 - Lives within 1 hour of care - Has a competent adult at home to recover with post-op recover - NO history of  - Chronic pain requiring opiods  - Diabetes  - Coronary Artery Disease  - Heart failure  - Heart attack  - Stroke  - DVT/VTE  - Cardiac arrhythmia  - Respiratory Failure/COPD  - Renal failure  - Anemia  - Advanced Liver disease     Breanna Webster is an 77 y.o. female.    Chief Complaint: left shoulder pain  HPI: Pt is a 77 y.o. female complaining of left shoulder pain for multiple years. Pain had continually increased since the beginning. X-rays in the clinic show end-stage arthritic changes of the left shoulder. Pt has tried various conservative treatments which have failed to alleviate their symptoms, including injections and therapy. Various options are discussed with the patient. Risks, benefits and expectations were discussed with the patient. Patient understand the risks, benefits and expectations and wishes to proceed with surgery.   PCP:  [name], NP  D/C Plans: Home  PMH: Past Medical History:  Diagnosis Date   Anxiety    Arthritis    "back" (04/22/2018)   Back pain    Blood transfusion without reported diagnosis    CAD (coronary artery disease)    a. 03/2018 s/p PCI/DES to the RCA (3.0x15 Onyx DES).   Carotid artery stenosis    Mild   Chest pain    CHF (congestive heart failure) (HCC)    Chronic lower back pain    Cirrhosis (HCC)    Colon polyps    Diverticulitis    Diverticulosis    Esophageal thickening    seen on pre TAVR CT scan, also questionable cirrhosis. MRI recommended. Will refer to GI   Fatty liver    GERD (gastroesophageal reflux disease)    Grave's disease    Heart murmur    History of colonic polyps 05/22/2017   History of hiatal hernia    Hyperlipidemia    Hypertension    Hypothyroidism    IBS (irritable bowel  syndrome)    Osteopenia    Parkinson's syndrome 04/23/2020   Pulmonary nodules    seen on pre TAVR CT. likley benign. no follow up recommended if pt low risk.   S/P TAVR (transcatheter aortic valve replacement)    Severe aortic stenosis    Shortness of breath on exertion    Stroke (HCC)    Thalassemia minor    Thyroid disease    Type II diabetes mellitus (HCC)     PSH: Past Surgical History:  Procedure Laterality Date   98 HOUR [place] STUDY N/A 03/03/2018   Procedure: 24 HOUR PH STUDY;  Surgeon: Mauri Pole, MD;  Location: [facility];  Service: Endoscopy;  Laterality: N/A;   AORTIC VALVE REPLACEMENT  06/2018   COLONOSCOPY     COLONOSCOPY W/ BIOPSIES AND POLYPECTOMY     CORONARY ANGIOGRAPHY Right 04/21/2018   Procedure: CORONARY ANGIOGRAPHY (CATH LAB);  Surgeon: [name], MD;  Location: [facility];  Service: Cardiovascular;  Laterality: Right;   CORONARY STENT INTERVENTION N/A 04/22/2018   Procedure: CORONARY STENT INTERVENTION;  Surgeon: [name], MD;  Location: [facility];  Service: Cardiovascular;  Laterality: N/A;   DILATION AND CURETTAGE OF UTERUS     ESOPHAGEAL MANOMETRY N/A 03/03/2018   Procedure: ESOPHAGEAL MANOMETRY (EM);  Surgeon:  Mauri Pole, MD;  Location: [facility];  Service: Endoscopy;  Laterality: N/A;   GASTRIC FUNDOPLICATION     HERNIA REPAIR     HYSTEROSCOPY     fibroids   LAPAROSCOPIC CHOLECYSTECTOMY     LAPAROSCOPY     fibroids   NISSEN FUNDOPLICATION  6045W   POLYPECTOMY     RIGHT/LEFT HEART CATH AND CORONARY ANGIOGRAPHY N/A 02/20/2018   Procedure: RIGHT/LEFT HEART CATH AND CORONARY ANGIOGRAPHY;  Surgeon: [name], MD;  Location: [facility];  Service: Cardiovascular;  Laterality: N/A;   TEE WITHOUT CARDIOVERSION N/A 07/08/2018   Procedure: TRANSESOPHAGEAL ECHOCARDIOGRAM (TEE);  Surgeon: Burnell Blanks, MD;  Location: [facility];  Service: Open Heart Surgery;  Laterality: N/A;   TEE WITHOUT  CARDIOVERSION  10/07/2018   TEE WITHOUT CARDIOVERSION N/A 10/07/2018   Procedure: TRANSESOPHAGEAL ECHOCARDIOGRAM (TEE);  Surgeon: Jerline Pain, MD;  Location: [facility];  Service: Cardiovascular;  Laterality: N/A;   TONSILLECTOMY     TRANSCATHETER AORTIC VALVE REPLACEMENT, TRANSFEMORAL N/A 07/08/2018   Procedure: TRANSCATHETER AORTIC VALVE REPLACEMENT, TRANSFEMORAL;  Surgeon: Burnell Blanks, MD;  Location: [facility];  Service: Open Heart Surgery;  Laterality: N/A;    Social History:  reports that she has never smoked. She has never been exposed to tobacco smoke. She has never used smokeless tobacco. She reports that she does not drink alcohol and does not use drugs. BMI: Estimated body mass index is 29 kg/m as calculated from the following:   Height as of 05/14/22: '5\' 5"'$  (1.651 m).   Weight as of 05/14/22: 79 kg.  Lab Results  Component Value Date   ALBUMIN 4.2 04/06/2022   Diabetes:   Patient has a diagnosis of diabetes,  Lab Results  Component Value Date   HGBA1C 7.4 (H) 09/08/2020   Smoking Status:   reports that she has never smoked. She has never been exposed to tobacco smoke. She has never used smokeless tobacco.    Allergies:  No Active Allergies  Medications: No current facility-administered medications for this encounter.   Current Outpatient Medications  Medication Sig Dispense Refill   amoxicillin-clavulanate (AUGMENTIN) 875-125 MG tablet Take 1 tablet by mouth 2 (two) times daily. 20 tablet 0   apixaban (ELIQUIS) 5 MG TABS tablet Take 1 tablet (5 mg total) by mouth 2 (two) times daily. 180 tablet 3   b complex vitamins capsule Take 1 capsule by mouth daily.     BD PEN NEEDLE NANO 2ND GEN 32G X 4 MM MISC      benzonatate (TESSALON PERLES) 100 MG capsule Take 1 capsule (100 mg total) by mouth 3 (three) times daily as needed for cough. 30 capsule 0   cholecalciferol (VITAMIN D) 25 MCG (1000 UNIT) tablet Take 1,000 Units by mouth daily.     Continuous Blood  Gluc Receiver (FREESTYLE LIBRE READER) DEVI Use as directed 1 each 0   Continuous Blood Gluc Sensor (FREESTYLE LIBRE 3 SENSOR) MISC Place one sensor every 14 days 2 each 11   diltiazem (CARDIZEM CD) 120 MG 24 hr capsule Take 1 capsule (120 mg total) by mouth daily. 90 capsule 3   escitalopram (LEXAPRO) 20 MG tablet TAKE ONE TABLET BY MOUTH DAILY 90 tablet 1   Evolocumab (REPATHA SURECLICK) 098 MG/ML SOAJ Inject 1 dose into the skin every 14 (fourteen) days. 6 mL 3   fluconazole (DIFLUCAN) 150 MG tablet Take 1 tablet (150 mg total) by mouth daily. 2 tablet 0   furosemide (LASIX) 20  MG tablet Take 2 tablets ('40mg'$ ) by mouth daily on 9/13, 9/14, and 9/15. Then take 1 tablet ('20mg'$ ) by mouth daily. (Patient taking differently: Take 20 mg by mouth every other day. Take 2 tablets ('40mg'$ ) by mouth daily on 9/13, 9/14, and 9/15. Then take 1 tablet ('20mg'$ ) by mouth daily.) 90 tablet 3   glucose blood (ONETOUCH VERIO) test strip USE TO CHECK BLOOD SUGAR THREE TIMES DAILY 300 strip 4   HYDROcodone-acetaminophen (NORCO) 5-325 MG tablet Take 1 tablet by mouth every 6 (six) hours. 60 tablet 0   influenza vaccine adjuvanted (FLUAD) 0.5 ML injection Inject into the muscle. 0.5 mL 0   insulin NPH Human (HUMULIN N) 100 UNIT/ML injection Inject 20 units under the skin in the morning and 14 units in evening 20 mL 5   Insulin NPH, Human,, Isophane, (HUMULIN N KWIKPEN) 100 UNIT/ML Kiwkpen Inject 20 units into the skin every morning and 14 units every evening. 15 mL 6   Insulin Pen Needle (UNIFINE PENTIPS) 32G X 4 MM MISC Inject 4 times subcutaneously 400 each 2   insulin regular (HUMULIN R) 100 units/mL injection Inject 10 Units total into the skin 3 (three) times daily before meals. 10 mL 5   Insulin Regular Human (NOVOLIN R FLEXPEN RELION) 100 UNIT/ML KwikPen Inject 5 to 10 units under the skin three times daily. 15 mL 6   Insulin Syringe-Needle U-100 (ULTICARE INSULIN SYRINGE) 31G X 5/16" 1 ML MISC Inject 4 times daily  subcutaneously 100 each 1   Lancets (ONETOUCH DELICA PLUS [id number]) MISC Use twice daily 200 each 1   levothyroxine (SYNTHROID) 137 MCG tablet Take 1 tablet by mouth every morning on an empty stomach  BRAND NAME ONLY 90 tablet 1   losartan (COZAAR) 50 MG tablet TAKE ONE TABLET BY MOUTH DAILY 90 tablet 3   meclizine (ANTIVERT) 25 MG tablet Take 1 tablet (25 mg total) by mouth 3 (three) times daily as needed for dizziness. 30 tablet 0   metFORMIN (GLUCOPHAGE-XR) 500 MG 24 hr tablet Take 1 tablet by mouth 2 times daily 180 tablet 2   methocarbamol (ROBAXIN) 500 MG tablet Take 1 tablet (500 mg total) by mouth 4 (four) times daily. 60 tablet 0   Multiple Vitamin (MULITIVITAMIN WITH MINERALS) TABS Take 1 tablet by mouth daily.     nitroGLYCERIN (NITROSTAT) 0.4 MG SL tablet Place 1 tablet (0.4 mg total) under the tongue every 5 (five) minutes as needed for chest pain. 25 tablet 4   NOVOLIN N 100 UNIT/ML injection Inject 28 Units into the skin daily before breakfast. Taking 20 units every morning and 8 units every evening     omeprazole (PRILOSEC) 20 MG capsule Take 1 capsule (20 mg total) by mouth 2 (two) times daily before a meal. (Patient taking differently: Take 20 mg by mouth daily.) 60 capsule 3   ondansetron (ZOFRAN) 4 MG tablet Take 1 tablet (4 mg total) by mouth every 8 (eight) hours as needed for nausea or vomiting. 20 tablet 0   potassium chloride SA (KLOR-CON M) 20 MEQ tablet Take 1 tablet (20 mEq total) by mouth daily as needed. Take with Furosemide (Lasix). 90 tablet 3   rOPINIRole (REQUIP) 0.25 MG tablet Take 1 tablet (0.25 mg total) by mouth 3 (three) times daily. 90 tablet 3   Vibegron (GEMTESA) 75 MG TABS Take 1 tablet by mouth daily. 30 tablet 11    No results found. However, due to the size of the patient record, not all encounters  were searched. Please check Results Review for a complete set of results. No results found.  ROS: Pain with rom of the left upper extremity  Physical  Exam: Alert and oriented 77 y.o. female in no acute distress Cranial nerves 2-12 intact Cervical spine: full rom with no tenderness, nv intact distally Chest: active breath sounds bilaterally, no wheeze rhonchi or rales Heart: regular rate and rhythm, no murmur Abd: non tender non distended with active bowel sounds Hip is stable with rom  Left shoulder painful and weak rom Nv intact distally No rashes or edema   Assessment/Plan Assessment: left shoulder cuff arthropathy  Plan:  Patient will undergo a left reverse total shoulder by Dr. Veverly Fells at [facility] Risks benefits and expectations were discussed with the patient. Patient understand risks, benefits and expectations and wishes to proceed. Preoperative templating of the joint replacement has been completed, documented, and submitted to the Operating Room personnel in order to optimize intra-operative equipment management.   Merla Riches PA-C, MPAS [facility] is now [employer] [facility] [address] [place], [place], [place] [address] Phone: [phone] [email or web address] Facebook  [employer]

## 2022-05-16 ENCOUNTER — Encounter (HOSPITAL_COMMUNITY): Payer: Self-pay

## 2022-05-16 ENCOUNTER — Other Ambulatory Visit: Payer: Self-pay

## 2022-05-16 ENCOUNTER — Encounter (HOSPITAL_COMMUNITY)
Admission: RE | Admit: 2022-05-16 | Discharge: 2022-05-16 | Disposition: A | Discharge status: Home / Self Care | Payer: Medicare Other | Source: Ambulatory Visit | Attending: Orthopedic Surgery | Admitting: Orthopedic Surgery

## 2022-05-16 VITALS — BP 124/75 | HR 76 | Temp 97.9°F | Resp 20 | Ht 64.5 in | Wt 172.0 lb

## 2022-05-16 DIAGNOSIS — Z794 Long term (current) use of insulin: Secondary | ICD-10-CM | POA: Diagnosis not present

## 2022-05-16 DIAGNOSIS — Z01818 Encounter for other preprocedural examination: Secondary | ICD-10-CM

## 2022-05-16 DIAGNOSIS — E119 Type 2 diabetes mellitus without complications: Secondary | ICD-10-CM | POA: Diagnosis not present

## 2022-05-16 DIAGNOSIS — Z01812 Encounter for preprocedural laboratory examination: Secondary | ICD-10-CM | POA: Insufficient documentation

## 2022-05-16 DIAGNOSIS — K769 Liver disease, unspecified: Secondary | ICD-10-CM | POA: Diagnosis not present

## 2022-05-16 DIAGNOSIS — D649 Anemia, unspecified: Secondary | ICD-10-CM | POA: Insufficient documentation

## 2022-05-16 LAB — CBC
HCT: 33.4 % — ABNORMAL LOW (ref 36.0–46.0)
Hemoglobin: 10.2 g/dL — ABNORMAL LOW (ref 12.0–15.0)
MCH: 20.2 pg — ABNORMAL LOW (ref 26.0–34.0)
MCHC: 30.5 g/dL (ref 30.0–36.0)
MCV: 66 fL — ABNORMAL LOW (ref 80.0–100.0)
Platelets: 158 10*3/uL (ref 150–400)
RBC: 5.06 MIL/uL (ref 3.87–5.11)
RDW: 16 % — ABNORMAL HIGH (ref 11.5–15.5)
WBC: 6.2 10*3/uL (ref 4.0–10.5)
nRBC: 0.3 % — ABNORMAL HIGH (ref 0.0–0.2)

## 2022-05-16 LAB — COMPREHENSIVE METABOLIC PANEL
ALT: 28 U/L (ref 0–44)
AST: 29 U/L (ref 15–41)
Albumin: 3.7 g/dL (ref 3.5–5.0)
Alkaline Phosphatase: 71 U/L (ref 38–126)
Anion gap: 10 (ref 5–15)
BUN: 16 mg/dL (ref 8–23)
CO2: 23 mmol/L (ref 22–32)
Calcium: 9.9 mg/dL (ref 8.9–10.3)
Chloride: 106 mmol/L (ref 98–111)
Creatinine, Ser: 0.66 mg/dL (ref 0.44–1.00)
GFR, Estimated: >60 mL/min (ref >60)
Glucose, Bld: 140 mg/dL — ABNORMAL HIGH (ref 70–99)
Potassium: 3.8 mmol/L (ref 3.5–5.1)
Sodium: 139 mmol/L (ref 135–145)
Total Bilirubin: 1 mg/dL (ref 0.3–1.2)
Total Protein: 6.6 g/dL (ref 6.5–8.1)

## 2022-05-16 LAB — HEMOGLOBIN A1C
Hgb A1c MFr Bld: 5.9 % — ABNORMAL HIGH (ref 4.8–5.6)
Mean Plasma Glucose: 122.63 mg/dL

## 2022-05-16 LAB — SURGICAL PCR SCREEN
MRSA, PCR: NEGATIVE
Staphylococcus aureus: POSITIVE — AB

## 2022-05-16 LAB — GLUCOSE, CAPILLARY: Glucose-Capillary: 156 mg/dL — ABNORMAL HIGH (ref 70–99)

## 2022-05-16 NOTE — Progress Notes (Signed)
Agree with assessment and plan as outlined.  

## 2022-05-16 NOTE — Progress Notes (Signed)
Anesthesia note:  Bowel prep reminder:    PCP - Beaulah Dinning Cardiologist -Dr. Linard Millers Other-   Chest x-ray - 04/25/22 EKG - 04/25/22 Stress Test - no ECHO - no Cardiac Cath - 2019 with stent CABG-no Pacemaker/ICD device last checked:no TAVR- 2020  Sleep Study - yes CPAP - no hasn't needed it in 2 years   CBG at PAT visit-156 Fasting Blood Sugar at home-93-151 Checks Blood Sugar _BID____  Blood Thinner:Eliquis Blood Thinner Instructions:stop 72 hours Aspirin Instructions: Last Dose:05/29/22  Anesthesia review: Yes  reason: DM, Cardio  Patient denies shortness of breath, fever, cough and chest pain at PAT appointment. Pt reports no SOB with activities she moves very slowly and has a tremor.    Patient verbalized understanding of instructions that were given to them at the PAT appointment. Patient was also instructed that they will need to review over the PAT instructions again at home before surgery.Instructions were reviewed several times and answers to Pt and husbands questions.

## 2022-05-17 NOTE — Telephone Encounter (Signed)
Dopaminergic agonists are also being tried ? Requip or Pramipexol? I would give the lowest manufactured dose , and offer tid regimen. Side effect can be hypersomnia.

## 2022-05-18 ENCOUNTER — Other Ambulatory Visit (HOSPITAL_BASED_OUTPATIENT_CLINIC_OR_DEPARTMENT_OTHER): Payer: Self-pay

## 2022-05-21 ENCOUNTER — Other Ambulatory Visit (HOSPITAL_BASED_OUTPATIENT_CLINIC_OR_DEPARTMENT_OTHER): Payer: Self-pay

## 2022-05-21 MED ORDER — PRAMIPEXOLE DIHYDROCHLORIDE 0.125 MG PO TABS
0.1250 mg | ORAL_TABLET | Freq: Three times a day (TID) | ORAL | 5 refills | Status: DC
  Filled 2022-05-21: qty 90, 30d supply, fill #0

## 2022-05-21 NOTE — Telephone Encounter (Signed)
Please advise patient that Dr. Brett Fairy recommends trialing pramipexole 0.'125mg'$  TID. This is the lowest manufactured dosage. Main side effect can be hypersomnia but she should tolerate okay being at such a low dosage. If no benefit after 1 week, please call office or send MyChart message for dosage increase.

## 2022-05-21 NOTE — Addendum Note (Signed)
Addended by: [name] L on: 05/21/2022 07:21 AM   Modules accepted: Orders

## 2022-05-25 DIAGNOSIS — R350 Frequency of micturition: Secondary | ICD-10-CM | POA: Diagnosis not present

## 2022-05-25 DIAGNOSIS — R3915 Urgency of urination: Secondary | ICD-10-CM | POA: Diagnosis not present

## 2022-05-28 DIAGNOSIS — M25561 Pain in right knee: Secondary | ICD-10-CM | POA: Diagnosis not present

## 2022-05-29 DIAGNOSIS — R350 Frequency of micturition: Secondary | ICD-10-CM | POA: Diagnosis not present

## 2022-05-29 DIAGNOSIS — R3915 Urgency of urination: Secondary | ICD-10-CM | POA: Diagnosis not present

## 2022-05-30 ENCOUNTER — Other Ambulatory Visit (HOSPITAL_BASED_OUTPATIENT_CLINIC_OR_DEPARTMENT_OTHER): Payer: Self-pay

## 2022-06-01 ENCOUNTER — Encounter (HOSPITAL_COMMUNITY)
Admission: RE | Disposition: A | Discharge status: Home Health | Payer: Self-pay | Source: Ambulatory Visit | Attending: Orthopedic Surgery

## 2022-06-01 ENCOUNTER — Observation Stay (HOSPITAL_COMMUNITY): Payer: Medicare Other

## 2022-06-01 ENCOUNTER — Encounter (HOSPITAL_COMMUNITY): Payer: Self-pay | Admitting: Orthopedic Surgery

## 2022-06-01 ENCOUNTER — Encounter: Payer: Self-pay | Admitting: Hematology

## 2022-06-01 ENCOUNTER — Other Ambulatory Visit (HOSPITAL_BASED_OUTPATIENT_CLINIC_OR_DEPARTMENT_OTHER): Payer: Self-pay

## 2022-06-01 ENCOUNTER — Other Ambulatory Visit: Payer: Self-pay

## 2022-06-01 ENCOUNTER — Ambulatory Visit (HOSPITAL_COMMUNITY): Payer: Medicare Other | Admitting: Anesthesiology

## 2022-06-01 ENCOUNTER — Observation Stay (HOSPITAL_COMMUNITY)
Admission: RE | Admit: 2022-06-01 | Discharge: 2022-06-03 | Disposition: A | Discharge status: Home Health | Payer: Medicare Other | Source: Ambulatory Visit | Attending: Orthopedic Surgery | Admitting: Orthopedic Surgery

## 2022-06-01 ENCOUNTER — Ambulatory Visit (HOSPITAL_BASED_OUTPATIENT_CLINIC_OR_DEPARTMENT_OTHER): Payer: Medicare Other | Admitting: Anesthesiology

## 2022-06-01 DIAGNOSIS — G20C Parkinsonism, unspecified: Secondary | ICD-10-CM | POA: Diagnosis not present

## 2022-06-01 DIAGNOSIS — Z471 Aftercare following joint replacement surgery: Secondary | ICD-10-CM | POA: Diagnosis not present

## 2022-06-01 DIAGNOSIS — Z79899 Other long term (current) drug therapy: Secondary | ICD-10-CM | POA: Insufficient documentation

## 2022-06-01 DIAGNOSIS — Z7901 Long term (current) use of anticoagulants: Secondary | ICD-10-CM | POA: Insufficient documentation

## 2022-06-01 DIAGNOSIS — Z794 Long term (current) use of insulin: Secondary | ICD-10-CM | POA: Diagnosis not present

## 2022-06-01 DIAGNOSIS — Z8673 Personal history of transient ischemic attack (TIA), and cerebral infarction without residual deficits: Secondary | ICD-10-CM | POA: Diagnosis not present

## 2022-06-01 DIAGNOSIS — D638 Anemia in other chronic diseases classified elsewhere: Secondary | ICD-10-CM | POA: Diagnosis not present

## 2022-06-01 DIAGNOSIS — I2510 Atherosclerotic heart disease of native coronary artery without angina pectoris: Secondary | ICD-10-CM | POA: Insufficient documentation

## 2022-06-01 DIAGNOSIS — G8918 Other acute postprocedural pain: Secondary | ICD-10-CM | POA: Diagnosis not present

## 2022-06-01 DIAGNOSIS — Z96612 Presence of left artificial shoulder joint: Secondary | ICD-10-CM

## 2022-06-01 DIAGNOSIS — Z955 Presence of coronary angioplasty implant and graft: Secondary | ICD-10-CM | POA: Diagnosis not present

## 2022-06-01 DIAGNOSIS — I509 Heart failure, unspecified: Secondary | ICD-10-CM | POA: Insufficient documentation

## 2022-06-01 DIAGNOSIS — E119 Type 2 diabetes mellitus without complications: Secondary | ICD-10-CM

## 2022-06-01 DIAGNOSIS — Z01818 Encounter for other preprocedural examination: Secondary | ICD-10-CM

## 2022-06-01 DIAGNOSIS — M19012 Primary osteoarthritis, left shoulder: Secondary | ICD-10-CM

## 2022-06-01 DIAGNOSIS — I700 Atherosclerosis of aorta: Secondary | ICD-10-CM | POA: Diagnosis not present

## 2022-06-01 DIAGNOSIS — Z7984 Long term (current) use of oral hypoglycemic drugs: Secondary | ICD-10-CM | POA: Insufficient documentation

## 2022-06-01 DIAGNOSIS — M94262 Chondromalacia, left knee: Secondary | ICD-10-CM | POA: Diagnosis not present

## 2022-06-01 DIAGNOSIS — I110 Hypertensive heart disease with heart failure: Secondary | ICD-10-CM | POA: Insufficient documentation

## 2022-06-01 DIAGNOSIS — E039 Hypothyroidism, unspecified: Secondary | ICD-10-CM | POA: Insufficient documentation

## 2022-06-01 DIAGNOSIS — M24012 Loose body in left shoulder: Secondary | ICD-10-CM | POA: Diagnosis not present

## 2022-06-01 DIAGNOSIS — I10 Essential (primary) hypertension: Secondary | ICD-10-CM | POA: Diagnosis not present

## 2022-06-01 DIAGNOSIS — K769 Liver disease, unspecified: Secondary | ICD-10-CM

## 2022-06-01 DIAGNOSIS — D649 Anemia, unspecified: Secondary | ICD-10-CM

## 2022-06-01 HISTORY — DX: Presence of left artificial shoulder joint: Z96.612

## 2022-06-01 HISTORY — PX: REVERSE SHOULDER ARTHROPLASTY: SHX5054

## 2022-06-01 LAB — GLUCOSE, CAPILLARY
Glucose-Capillary: 117 mg/dL — ABNORMAL HIGH (ref 70–99)
Glucose-Capillary: 107 mg/dL — ABNORMAL HIGH (ref 70–99)
Glucose-Capillary: 341 mg/dL — ABNORMAL HIGH (ref 70–99)
Glucose-Capillary: 332 mg/dL — ABNORMAL HIGH (ref 70–99)

## 2022-06-01 SURGERY — ARTHROPLASTY, SHOULDER, TOTAL, REVERSE
Anesthesia: Regional | Site: Shoulder | Laterality: Left

## 2022-06-01 MED ORDER — SUCCINYLCHOLINE CHLORIDE 200 MG/10ML IV SOSY
PREFILLED_SYRINGE | INTRAVENOUS | Status: DC | PRN
  Administered 2022-06-01: 100 mg via INTRAVENOUS

## 2022-06-01 MED ORDER — TRANEXAMIC ACID-NACL 1000-0.7 MG/100ML-% IV SOLN
1000.0000 mg | INTRAVENOUS | Status: AC
  Administered 2022-06-01: 1000 mg via INTRAVENOUS
  Filled 2022-06-01: qty 100

## 2022-06-01 MED ORDER — BUPIVACAINE HCL 0.25 % IJ SOLN
INTRAMUSCULAR | Status: AC
  Filled 2022-06-01: qty 1

## 2022-06-01 MED ORDER — EVOLOCUMAB 140 MG/ML ~~LOC~~ SOAJ: SUBCUTANEOUS | Status: DC

## 2022-06-01 MED ORDER — TRAMADOL HCL 50 MG PO TABS
50.0000 mg | ORAL_TABLET | Freq: Four times a day (QID) | ORAL | Status: DC | PRN
  Administered 2022-06-02 – 2022-06-03 (×5): 50 mg via ORAL
  Filled 2022-06-01 (×6): qty 1

## 2022-06-01 MED ORDER — CEFAZOLIN SODIUM-DEXTROSE 2-4 GM/100ML-% IV SOLN
2.0000 g | Freq: Four times a day (QID) | INTRAVENOUS | Status: AC
  Administered 2022-06-01 – 2022-06-02 (×3): 2 g via INTRAVENOUS
  Filled 2022-06-01 (×3): qty 100

## 2022-06-01 MED ORDER — POTASSIUM CHLORIDE CRYS ER 20 MEQ PO TBCR
20.0000 meq | EXTENDED_RELEASE_TABLET | Freq: Every day | ORAL | Status: DC
  Administered 2022-06-02 – 2022-06-03 (×2): 20 meq via ORAL
  Filled 2022-06-01 (×3): qty 1

## 2022-06-01 MED ORDER — METFORMIN HCL ER 500 MG PO TB24
500.0000 mg | ORAL_TABLET | Freq: Two times a day (BID) | ORAL | Status: DC
  Administered 2022-06-02 – 2022-06-03 (×2): 500 mg via ORAL
  Filled 2022-06-01 (×2): qty 1

## 2022-06-01 MED ORDER — ADULT MULTIVITAMIN W/MINERALS CH
1.0000 | ORAL_TABLET | Freq: Every day | ORAL | Status: DC
  Administered 2022-06-02 – 2022-06-03 (×2): 1 via ORAL
  Filled 2022-06-01 (×2): qty 1

## 2022-06-01 MED ORDER — CHLORHEXIDINE GLUCONATE 0.12 % MT SOLN
15.0000 mL | Freq: Once | OROMUCOSAL | Status: AC
  Administered 2022-06-01: 15 mL via OROMUCOSAL

## 2022-06-01 MED ORDER — FUROSEMIDE 20 MG PO TABS
20.0000 mg | ORAL_TABLET | Freq: Every day | ORAL | Status: DC
  Administered 2022-06-02 – 2022-06-03 (×2): 20 mg via ORAL
  Filled 2022-06-01 (×3): qty 1

## 2022-06-01 MED ORDER — FENTANYL CITRATE PF 50 MCG/ML IJ SOSY
100.0000 ug | PREFILLED_SYRINGE | INTRAMUSCULAR | Status: AC
  Administered 2022-06-01: 50 ug via INTRAVENOUS
  Filled 2022-06-01: qty 2

## 2022-06-01 MED ORDER — NITROGLYCERIN 0.4 MG SL SUBL: 0.4000 mg | SUBLINGUAL_TABLET | SUBLINGUAL | Status: DC | PRN

## 2022-06-01 MED ORDER — ONDANSETRON HCL 4 MG/2ML IJ SOLN
INTRAMUSCULAR | Status: DC | PRN
  Administered 2022-06-01: 4 mg via INTRAVENOUS

## 2022-06-01 MED ORDER — ONDANSETRON HCL 4 MG/2ML IJ SOLN: 4.0000 mg | Freq: Four times a day (QID) | INTRAMUSCULAR | Status: DC | PRN

## 2022-06-01 MED ORDER — METHOCARBAMOL 500 MG PO TABS
500.0000 mg | ORAL_TABLET | Freq: Four times a day (QID) | ORAL | Status: DC | PRN
  Administered 2022-06-02 – 2022-06-03 (×4): 500 mg via ORAL
  Filled 2022-06-01 (×5): qty 1

## 2022-06-01 MED ORDER — ACETAMINOPHEN 500 MG PO TABS
1000.0000 mg | ORAL_TABLET | Freq: Once | ORAL | Status: AC
  Administered 2022-06-01: 1000 mg via ORAL
  Filled 2022-06-01: qty 2

## 2022-06-01 MED ORDER — PHENYLEPHRINE HCL-NACL 20-0.9 MG/250ML-% IV SOLN
INTRAVENOUS | Status: DC | PRN
  Administered 2022-06-01: 50 ug/min via INTRAVENOUS

## 2022-06-01 MED ORDER — DEXAMETHASONE SODIUM PHOSPHATE 10 MG/ML IJ SOLN
INTRAMUSCULAR | Status: DC | PRN
  Administered 2022-06-01: 4 mg via INTRAVENOUS

## 2022-06-01 MED ORDER — STERILE WATER FOR IRRIGATION IR SOLN
Status: DC | PRN
  Administered 2022-06-01: 2000 mL

## 2022-06-01 MED ORDER — MIRABEGRON ER 25 MG PO TB24
25.0000 mg | ORAL_TABLET | Freq: Every day | ORAL | Status: DC
  Administered 2022-06-01 – 2022-06-02 (×2): 25 mg via ORAL
  Filled 2022-06-01 (×3): qty 1

## 2022-06-01 MED ORDER — PHENOL 1.4 % MT LIQD: 1.0000 | OROMUCOSAL | Status: DC | PRN

## 2022-06-01 MED ORDER — EPINEPHRINE PF 1 MG/ML IJ SOLN
INTRAMUSCULAR | Status: AC
  Filled 2022-06-01: qty 1

## 2022-06-01 MED ORDER — BUPIVACAINE HCL (PF) 0.25 % IJ SOLN
INTRAMUSCULAR | Status: DC | PRN
  Administered 2022-06-01: 15 mL via PERINEURAL

## 2022-06-01 MED ORDER — AMISULPRIDE (ANTIEMETIC) 5 MG/2ML IV SOLN: 10.0000 mg | Freq: Once | INTRAVENOUS | Status: DC | PRN

## 2022-06-01 MED ORDER — SODIUM CHLORIDE 0.9 % IV SOLN: INTRAVENOUS | Status: DC

## 2022-06-01 MED ORDER — PRAMIPEXOLE DIHYDROCHLORIDE 0.25 MG PO TABS
0.1250 mg | ORAL_TABLET | Freq: Three times a day (TID) | ORAL | Status: DC
  Administered 2022-06-02: 0.125 mg via ORAL
  Filled 2022-06-01 (×3): qty 0.5

## 2022-06-01 MED ORDER — BUPIVACAINE LIPOSOME 1.3 % IJ SUSP
INTRAMUSCULAR | Status: DC | PRN
  Administered 2022-06-01: 10 mL via PERINEURAL

## 2022-06-01 MED ORDER — TRANEXAMIC ACID-NACL 1000-0.7 MG/100ML-% IV SOLN
1000.0000 mg | Freq: Once | INTRAVENOUS | Status: AC
  Administered 2022-06-01: 1000 mg via INTRAVENOUS
  Filled 2022-06-01: qty 100

## 2022-06-01 MED ORDER — MORPHINE SULFATE (PF) 2 MG/ML IV SOLN: 0.5000 mg | INTRAVENOUS | Status: DC | PRN

## 2022-06-01 MED ORDER — METOCLOPRAMIDE HCL 5 MG PO TABS: 5.0000 mg | ORAL_TABLET | Freq: Three times a day (TID) | ORAL | Status: DC | PRN

## 2022-06-01 MED ORDER — BUPIVACAINE-EPINEPHRINE (PF) 0.25% -1:200000 IJ SOLN
INTRAMUSCULAR | Status: DC | PRN
  Administered 2022-06-01: 15 mL

## 2022-06-01 MED ORDER — SODIUM CHLORIDE 0.9 % IR SOLN
Status: DC | PRN
  Administered 2022-06-01: 1000 mL

## 2022-06-01 MED ORDER — DEXMEDETOMIDINE HCL IN NACL 80 MCG/20ML IV SOLN
INTRAVENOUS | Status: DC | PRN
  Administered 2022-06-01: 8 ug via BUCCAL

## 2022-06-01 MED ORDER — MENTHOL 3 MG MT LOZG: 1.0000 | LOZENGE | OROMUCOSAL | Status: DC | PRN

## 2022-06-01 MED ORDER — PHENYLEPHRINE HCL (PRESSORS) 10 MG/ML IV SOLN
INTRAVENOUS | Status: AC
  Filled 2022-06-01: qty 1

## 2022-06-01 MED ORDER — LACTATED RINGERS IV SOLN: INTRAVENOUS | Status: DC

## 2022-06-01 MED ORDER — INSULIN NPH (HUMAN) (ISOPHANE) 100 UNIT/ML ~~LOC~~ SUSP
14.0000 [IU] | Freq: Every day | SUBCUTANEOUS | Status: DC
  Administered 2022-06-01 – 2022-06-02 (×2): 14 [IU] via SUBCUTANEOUS

## 2022-06-01 MED ORDER — INSULIN ASPART 100 UNIT/ML IJ SOLN: 6.0000 [IU] | Freq: Three times a day (TID) | INTRAMUSCULAR | Status: DC

## 2022-06-01 MED ORDER — DOCUSATE SODIUM 100 MG PO CAPS
100.0000 mg | ORAL_CAPSULE | Freq: Two times a day (BID) | ORAL | Status: DC
  Administered 2022-06-01 – 2022-06-03 (×4): 100 mg via ORAL
  Filled 2022-06-01 (×4): qty 1

## 2022-06-01 MED ORDER — LEVOTHYROXINE SODIUM 25 MCG PO TABS
137.0000 ug | ORAL_TABLET | Freq: Every day | ORAL | Status: DC
  Administered 2022-06-02 – 2022-06-03 (×2): 137 ug via ORAL
  Filled 2022-06-01 (×2): qty 1

## 2022-06-01 MED ORDER — POLYETHYLENE GLYCOL 3350 17 G PO PACK: 17.0000 g | PACK | Freq: Every day | ORAL | Status: DC | PRN

## 2022-06-01 MED ORDER — B COMPLEX-C PO TABS
1.0000 | ORAL_TABLET | Freq: Every day | ORAL | Status: DC
  Administered 2022-06-02 – 2022-06-03 (×2): 1 via ORAL
  Filled 2022-06-01 (×2): qty 1

## 2022-06-01 MED ORDER — MECLIZINE HCL 25 MG PO TABS: 25.0000 mg | ORAL_TABLET | Freq: Three times a day (TID) | ORAL | Status: DC | PRN

## 2022-06-01 MED ORDER — LOSARTAN POTASSIUM 50 MG PO TABS
50.0000 mg | ORAL_TABLET | Freq: Every day | ORAL | Status: DC
  Administered 2022-06-02 – 2022-06-03 (×2): 50 mg via ORAL
  Filled 2022-06-01 (×4): qty 1

## 2022-06-01 MED ORDER — FENTANYL CITRATE (PF) 100 MCG/2ML IJ SOLN
INTRAMUSCULAR | Status: DC | PRN
  Administered 2022-06-01 (×2): 50 ug via INTRAVENOUS

## 2022-06-01 MED ORDER — ESCITALOPRAM OXALATE 20 MG PO TABS
20.0000 mg | ORAL_TABLET | Freq: Every day | ORAL | Status: DC
  Administered 2022-06-02 – 2022-06-03 (×2): 20 mg via ORAL
  Filled 2022-06-01 (×2): qty 1

## 2022-06-01 MED ORDER — INSULIN ASPART 100 UNIT/ML IJ SOLN
0.0000 [IU] | Freq: Three times a day (TID) | INTRAMUSCULAR | Status: DC
  Administered 2022-06-02 (×2): 4 [IU] via SUBCUTANEOUS
  Administered 2022-06-02 – 2022-06-03 (×2): 3 [IU] via SUBCUTANEOUS
  Administered 2022-06-03: 4 [IU] via SUBCUTANEOUS

## 2022-06-01 MED ORDER — METHOCARBAMOL 500 MG IVPB - SIMPLE MED: 500.0000 mg | Freq: Four times a day (QID) | INTRAVENOUS | Status: DC | PRN

## 2022-06-01 MED ORDER — INSULIN ASPART 100 UNIT/ML IJ SOLN
0.0000 [IU] | Freq: Every day | INTRAMUSCULAR | Status: DC
  Administered 2022-06-01: 4 [IU] via SUBCUTANEOUS
  Administered 2022-06-02: 2 [IU] via SUBCUTANEOUS

## 2022-06-01 MED ORDER — FLUCONAZOLE 150 MG PO TABS: 150.0000 mg | ORAL_TABLET | Freq: Every day | ORAL | Status: DC

## 2022-06-01 MED ORDER — PROPOFOL 10 MG/ML IV BOLUS
INTRAVENOUS | Status: DC | PRN
  Administered 2022-06-01: 150 mg via INTRAVENOUS
  Administered 2022-06-01: 50 mg via INTRAVENOUS

## 2022-06-01 MED ORDER — PROMETHAZINE HCL 25 MG/ML IJ SOLN: 6.2500 mg | INTRAMUSCULAR | Status: DC | PRN

## 2022-06-01 MED ORDER — APIXABAN 5 MG PO TABS
5.0000 mg | ORAL_TABLET | Freq: Two times a day (BID) | ORAL | Status: DC
  Administered 2022-06-02 – 2022-06-03 (×3): 5 mg via ORAL
  Filled 2022-06-01 (×3): qty 1

## 2022-06-01 MED ORDER — ORAL CARE MOUTH RINSE: 15.0000 mL | Freq: Once | OROMUCOSAL | Status: AC

## 2022-06-01 MED ORDER — MIDAZOLAM HCL 2 MG/2ML IJ SOLN
2.0000 mg | INTRAMUSCULAR | Status: DC
  Filled 2022-06-01: qty 2

## 2022-06-01 MED ORDER — ACETAMINOPHEN 325 MG PO TABS
325.0000 mg | ORAL_TABLET | Freq: Four times a day (QID) | ORAL | Status: DC | PRN
  Administered 2022-06-02: 325 mg via ORAL
  Administered 2022-06-02 – 2022-06-03 (×2): 650 mg via ORAL
  Filled 2022-06-01 (×4): qty 2

## 2022-06-01 MED ORDER — METOCLOPRAMIDE HCL 5 MG/ML IJ SOLN: 5.0000 mg | Freq: Three times a day (TID) | INTRAMUSCULAR | Status: DC | PRN

## 2022-06-01 MED ORDER — TRAMADOL HCL 50 MG PO TABS
50.0000 mg | ORAL_TABLET | Freq: Four times a day (QID) | ORAL | 0 refills | Status: DC | PRN
  Filled 2022-06-01: qty 30, 8d supply, fill #0

## 2022-06-01 MED ORDER — ONDANSETRON HCL 4 MG PO TABS: 4.0000 mg | ORAL_TABLET | Freq: Four times a day (QID) | ORAL | Status: DC | PRN

## 2022-06-01 MED ORDER — ACETAMINOPHEN 500 MG PO TABS: 500.0000 mg | ORAL_TABLET | Freq: Four times a day (QID) | ORAL | Status: DC | PRN

## 2022-06-01 MED ORDER — METHOCARBAMOL 500 MG PO TABS: 500.0000 mg | ORAL_TABLET | Freq: Three times a day (TID) | ORAL | Status: DC | PRN

## 2022-06-01 MED ORDER — CEFAZOLIN SODIUM-DEXTROSE 2-4 GM/100ML-% IV SOLN
2.0000 g | INTRAVENOUS | Status: AC
  Administered 2022-06-01: 2 g via INTRAVENOUS
  Filled 2022-06-01: qty 100

## 2022-06-01 MED ORDER — FENTANYL CITRATE PF 50 MCG/ML IJ SOSY
PREFILLED_SYRINGE | INTRAMUSCULAR | Status: AC
  Filled 2022-06-01: qty 1

## 2022-06-01 MED ORDER — ONDANSETRON HCL 4 MG PO TABS: 4.0000 mg | ORAL_TABLET | Freq: Three times a day (TID) | ORAL | Status: DC | PRN

## 2022-06-01 MED ORDER — INSULIN NPH (HUMAN) (ISOPHANE) 100 UNIT/ML ~~LOC~~ SUSP
14.0000 [IU] | Freq: Two times a day (BID) | SUBCUTANEOUS | Status: DC
  Filled 2022-06-01: qty 10

## 2022-06-01 MED ORDER — AMOXICILLIN-POT CLAVULANATE 875-125 MG PO TABS: 1.0000 | ORAL_TABLET | Freq: Two times a day (BID) | ORAL | Status: DC

## 2022-06-01 MED ORDER — PANTOPRAZOLE SODIUM 40 MG PO TBEC
40.0000 mg | DELAYED_RELEASE_TABLET | Freq: Every day | ORAL | Status: DC
  Administered 2022-06-02 – 2022-06-03 (×2): 40 mg via ORAL
  Filled 2022-06-01 (×2): qty 1

## 2022-06-01 MED ORDER — VITAMIN D 25 MCG (1000 UNIT) PO TABS
1000.0000 [IU] | ORAL_TABLET | Freq: Every day | ORAL | Status: DC
  Administered 2022-06-02 – 2022-06-03 (×2): 1000 [IU] via ORAL
  Filled 2022-06-01 (×2): qty 1

## 2022-06-01 MED ORDER — INSULIN NPH (HUMAN) (ISOPHANE) 100 UNIT/ML ~~LOC~~ SUSP
20.0000 [IU] | Freq: Every day | SUBCUTANEOUS | Status: DC
  Administered 2022-06-02 – 2022-06-03 (×2): 20 [IU] via SUBCUTANEOUS
  Filled 2022-06-01: qty 10

## 2022-06-01 MED ORDER — METHOCARBAMOL 500 MG PO TABS
500.0000 mg | ORAL_TABLET | Freq: Three times a day (TID) | ORAL | 1 refills | Status: DC | PRN
  Filled 2022-06-01: qty 40, 14d supply, fill #0

## 2022-06-01 MED ORDER — FENTANYL CITRATE (PF) 100 MCG/2ML IJ SOLN
INTRAMUSCULAR | Status: AC
  Filled 2022-06-01: qty 2

## 2022-06-01 MED ORDER — DILTIAZEM HCL ER COATED BEADS 120 MG PO CP24
120.0000 mg | ORAL_CAPSULE | Freq: Every day | ORAL | Status: DC
  Administered 2022-06-02 – 2022-06-03 (×2): 120 mg via ORAL
  Filled 2022-06-01 (×2): qty 1

## 2022-06-01 MED ORDER — FENTANYL CITRATE PF 50 MCG/ML IJ SOSY: 25.0000 ug | PREFILLED_SYRINGE | INTRAMUSCULAR | Status: DC | PRN

## 2022-06-01 MED ORDER — METHOCARBAMOL 500 MG IVPB - SIMPLE MED
INTRAVENOUS | Status: AC
  Filled 2022-06-01: qty 55

## 2022-06-01 SURGICAL SUPPLY — 71 items
AID PSTN UNV HD RSTRNT DISP (MISCELLANEOUS) ×1
BAG COUNTER SPONGE SURGICOUNT (BAG) IMPLANT
BAG SPEC THK2 15X12 ZIP CLS (MISCELLANEOUS)
BAG SPNG CNTER NS LX DISP (BAG)
BAG ZIPLOCK 12X15 (MISCELLANEOUS) IMPLANT
BIT DRILL 1.6MX128 (BIT) IMPLANT
BIT DRILL 170X2.5X (BIT) IMPLANT
BIT DRL 170X2.5X (BIT) ×1
BLADE SAG 18X100X1.27 (BLADE) ×1 IMPLANT
COVER BACK TABLE 60X90IN (DRAPES) ×1 IMPLANT
COVER SURGICAL LIGHT HANDLE (MISCELLANEOUS) ×1 IMPLANT
DRAPE INCISE IOBAN 66X45 STRL (DRAPES) ×1 IMPLANT
DRAPE ORTHO SPLIT 77X108 STRL (DRAPES) ×2
DRAPE SHEET LG 3/4 BI-LAMINATE (DRAPES) ×1 IMPLANT
DRAPE SURG ORHT 6 SPLT 77X108 (DRAPES) ×2 IMPLANT
DRAPE TOP 10253 STERILE (DRAPES) ×1 IMPLANT
DRAPE U-SHAPE 47X51 STRL (DRAPES) ×1 IMPLANT
DRILL 2.5 (BIT) ×1
DRSG ADAPTIC 3X8 NADH LF (GAUZE/BANDAGES/DRESSINGS) ×1 IMPLANT
DURAPREP 26ML APPLICATOR (WOUND CARE) ×1 IMPLANT
ELECT BLADE TIP CTD 4 INCH (ELECTRODE) ×1 IMPLANT
ELECT NDL TIP 2.8 STRL (NEEDLE) ×1 IMPLANT
ELECT NEEDLE TIP 2.8 STRL (NEEDLE) ×1 IMPLANT
ELECT REM PT RETURN 15FT ADLT (MISCELLANEOUS) ×1 IMPLANT
EPI LT SZ 1 (Orthopedic Implant) ×1 IMPLANT
EPIPHYSIS LT SZ 1 (Orthopedic Implant) IMPLANT
FACESHIELD WRAPAROUND (MASK) ×1 IMPLANT
FACESHIELD WRAPAROUND OR TEAM (MASK) ×1 IMPLANT
GAUZE PAD ABD 8X10 STRL (GAUZE/BANDAGES/DRESSINGS) ×1 IMPLANT
GAUZE SPONGE 4X4 12PLY STRL (GAUZE/BANDAGES/DRESSINGS) ×1 IMPLANT
GLENOSPHERE DELTA XTEND LAT 38 (Miscellaneous) IMPLANT
GLOVE BIOGEL PI IND STRL 7.5 (GLOVE) ×1 IMPLANT
GLOVE BIOGEL PI IND STRL 8.5 (GLOVE) ×1 IMPLANT
GLOVE ORTHO TXT STRL SZ7.5 (GLOVE) ×1 IMPLANT
GLOVE SURG ORTHO 8.5 STRL (GLOVE) ×1 IMPLANT
GOWN STRL REUS W/ TWL XL LVL3 (GOWN DISPOSABLE) ×2 IMPLANT
GOWN STRL REUS W/TWL XL LVL3 (GOWN DISPOSABLE) ×2
KIT BASIN OR (CUSTOM PROCEDURE TRAY) ×1 IMPLANT
KIT TURNOVER KIT A (KITS) IMPLANT
MANIFOLD NEPTUNE II (INSTRUMENTS) ×1 IMPLANT
METAGLENE DELTA EXTEND (Trauma) IMPLANT
METAGLENE DXTEND (Trauma) ×1 IMPLANT
NDL MAYO CATGUT SZ4 TPR NDL (NEEDLE) ×1 IMPLANT
NEEDLE MAYO CATGUT SZ4 (NEEDLE) ×1 IMPLANT
NS IRRIG 1000ML POUR BTL (IV SOLUTION) ×1 IMPLANT
PACK SHOULDER (CUSTOM PROCEDURE TRAY) ×1 IMPLANT
PIN GUIDE 1.2 (PIN) IMPLANT
PIN GUIDE GLENOPHERE 1.5MX300M (PIN) IMPLANT
PIN METAGLENE 2.5 (PIN) IMPLANT
PROTECTOR NERVE ULNAR (MISCELLANEOUS) ×1 IMPLANT
RESTRAINT HEAD UNIVERSAL NS (MISCELLANEOUS) ×1 IMPLANT
SCREW LOCK 42 (Screw) IMPLANT
SCREW LOCK DELTA XTEND 4.5X30 (Screw) IMPLANT
SLING ARM FOAM STRAP LRG (SOFTGOODS) IMPLANT
SMARTMIX MINI TOWER (MISCELLANEOUS)
SPACER 38 PLUS 3 (Spacer) IMPLANT
SPIKE FLUID TRANSFER (MISCELLANEOUS) ×1 IMPLANT
SPONGE T-LAP 4X18 ~~LOC~~+RFID (SPONGE) ×1 IMPLANT
STEM DELTA DIA 10 HA (Stem) IMPLANT
STRIP CLOSURE SKIN 1/2X4 (GAUZE/BANDAGES/DRESSINGS) ×1 IMPLANT
SUCTION FRAZIER HANDLE 10FR (MISCELLANEOUS) ×1
SUCTION TUBE FRAZIER 10FR DISP (MISCELLANEOUS) ×1 IMPLANT
SUT FIBERWIRE #2 38 T-5 BLUE (SUTURE) ×2
SUT MNCRL AB 4-0 PS2 18 (SUTURE) ×1 IMPLANT
SUT VIC AB 0 CT1 36 (SUTURE) ×2 IMPLANT
SUT VIC AB 0 CT2 27 (SUTURE) ×1 IMPLANT
SUT VIC AB 2-0 CT1 27 (SUTURE) ×1
SUT VIC AB 2-0 CT1 TAPERPNT 27 (SUTURE) ×1 IMPLANT
SUTURE FIBERWR #2 38 T-5 BLUE (SUTURE) ×2 IMPLANT
TOWEL OR 17X26 10 PK STRL BLUE (TOWEL DISPOSABLE) ×1 IMPLANT
TOWER SMARTMIX MINI (MISCELLANEOUS) IMPLANT

## 2022-06-01 NOTE — Anesthesia Procedure Notes (Signed)
Anesthesia Regional Block: Interscalene brachial plexus block   Pre-Anesthetic Checklist: , timeout performed,  Correct Patient, Correct Site, Correct Laterality,  Correct Procedure, Correct Position, site marked,  Risks and benefits discussed,  Surgical consent,  Pre-op evaluation,  At surgeon's request and post-op pain management  Laterality: Left  Prep: chloraprep       Needles:  Injection technique: Single-shot  Needle Type: Echogenic Stimulator Needle     Needle Length: 5cm  Needle Gauge: 22     Additional Needles:   Procedures:, nerve stimulator,,,,,     Nerve Stimulator or Paresthesia:  Response: biceps flexion, 0.45 mA  Additional Responses:   Narrative:  Start time: 06/01/2022 1:57 PM End time: 06/01/2022 2:05 PM Injection made incrementally with aspirations every 5 mL.  Performed by: Personally  Anesthesiologist: Albertha Ghee, MD  Additional Notes: Functioning IV was confirmed and monitors were applied.  A 18m 22ga Arrow echogenic stimulator needle was used. Sterile prep and drape,hand hygiene and sterile gloves were used.  Negative aspiration and negative test dose prior to incremental administration of local anesthetic. The patient tolerated the procedure well.  Ultrasound guidance: relevent anatomy identified, needle position confirmed, local anesthetic spread visualized around nerve(s), vascular puncture avoided.  Image printed for medical record.

## 2022-06-01 NOTE — Anesthesia Procedure Notes (Signed)
Procedure Name: Intubation Date/Time: 06/01/2022 2:46 PM  Performed by: Jonna Munro, CRNAPre-anesthesia Checklist: Patient identified, Emergency Drugs available, Suction available, Patient being monitored and Timeout performed Patient Re-evaluated:Patient Re-evaluated prior to induction Oxygen Delivery Method: Circle system utilized Preoxygenation: Pre-oxygenation with 100% oxygen Induction Type: IV induction Laryngoscope Size: Mac and 3 Grade View: Grade I Tube type: Oral Tube size: 7.0 mm Number of attempts: 1 Airway Equipment and Method: Stylet Placement Confirmation: ETT inserted through vocal cords under direct vision, positive ETCO2, CO2 detector and breath sounds checked- equal and bilateral Secured at: 22 cm Tube secured with: Tape Dental Injury: Teeth and Oropharynx as per pre-operative assessment

## 2022-06-01 NOTE — Interval H&P Note (Signed)
History and Physical Interval Note:  06/01/2022 1:48 PM  Breanna Webster  has presented today for surgery, with the diagnosis of left shoulder osteoarthritis.  The various methods of treatment have been discussed with the patient and family. After consideration of risks, benefits and other options for treatment, the patient has consented to  Procedure(s) with comments: REVERSE SHOULDER ARTHROPLASTY (Left) - choice with interscalene block as a surgical intervention.  The patient's history has been reviewed, patient examined, no change in status, stable for surgery.  I have reviewed the patient's chart and labs.  Questions were answered to the patient's satisfaction.     Augustin Schooling

## 2022-06-01 NOTE — Discharge Instructions (Signed)
Ice to the shoulder constantly.  Keep the incision covered and clean and dry for one week, then ok to get it wet in the shower. Please leave the incision uncovered after one week  Do exercise as instructed several times per day.  DO NOT reach behind your back or push up out of a chair with the operative arm.  Use a sling while you are up and around for comfort, may remove while seated.  Keep pillow propped behind the operative elbow.  Follow up with Dr Veverly Fells in two weeks in the office, call [phone] for appt  Call Dr Veverly Fells (cell) [phone] with any questions or concerns

## 2022-06-01 NOTE — Transfer of Care (Signed)
Immediate Anesthesia Transfer of Care Note  Patient: [name]  Procedure(s) Performed: REVERSE SHOULDER ARTHROPLASTY (Left: Shoulder)  Patient Location: PACU  Anesthesia Type:General  Level of Consciousness: awake, alert , and patient cooperative  Airway & Oxygen Therapy: Patient Spontanous Breathing and Patient connected to face mask oxygen  Post-op Assessment: Report given to RN and Post -op Vital signs reviewed and stable  Post vital signs: Reviewed and stable  Last Vitals:  Vitals Value Taken Time  BP 175/70 06/01/22 1612  Temp    Pulse 80 06/01/22 1615  Resp 21 06/01/22 1615  SpO2 96 % 06/01/22 1615  Vitals shown include unvalidated device data.  Last Pain:  Vitals:   06/01/22 1405  TempSrc:   PainSc: 0-No pain         Complications: No notable events documented.

## 2022-06-01 NOTE — Brief Op Note (Signed)
06/01/2022  4:00 PM  PATIENT:  Breanna Webster  77 y.o. female  PRE-OPERATIVE DIAGNOSIS:  left shoulder osteoarthritis, left shoulder severe pain  POST-OPERATIVE DIAGNOSIS:  left shoulder osteoarthritis, left shoulder severe pain  PROCEDURE:  Procedure(s) with comments: REVERSE SHOULDER ARTHROPLASTY (Left) - choice with interscalene block DePuy Delta Xtend with no subscap repair  SURGEON:  Surgeon(s) and Role:    Netta Cedars, MD - Primary  PHYSICIAN ASSISTANT:   ASSISTANTS: Ventura Bruns, PA-C   ANESTHESIA:   regional and general  EBL:  150 mL   BLOOD ADMINISTERED:none  DRAINS: none   LOCAL MEDICATIONS USED:  MARCAINE     SPECIMEN:  No Specimen  DISPOSITION OF SPECIMEN:  N/A  COUNTS:  YES  TOURNIQUET:  * No tourniquets in log *  DICTATION: .Other Dictation: Dictation Number [id number]  PLAN OF CARE: Admit for overnight observation  PATIENT DISPOSITION:  PACU - hemodynamically stable.   Delay start of Pharmacological VTE agent (>24hrs) due to surgical blood loss or risk of bleeding: not applicable

## 2022-06-01 NOTE — Anesthesia Preprocedure Evaluation (Addendum)
Anesthesia Evaluation  Patient identified by MRN, date of birth, ID band Patient awake    Reviewed: Allergy & Precautions, NPO status , Patient's Chart, lab work & pertinent test results  History of Anesthesia Complications Negative for: history of anesthetic complications  Airway Mallampati: III  TM Distance: >3 FB Neck ROM: Full    Dental no notable dental hx. (+) Dental Advisory Given   Pulmonary shortness of breath and with exertion   Pulmonary exam normal        Cardiovascular hypertension, Pt. on home beta blockers and Pt. on medications + CAD and + Cardiac Stents  Normal cardiovascular exam+ Valvular Problems/Murmurs AS   S/p TAVR Echo 2022 IMPRESSIONS     1. Left ventricular ejection fraction, by estimation, is 60 to 65%. The  left ventricle has normal function. The left ventricle has no regional  wall motion abnormalities. There is mild left ventricular hypertrophy.  Left ventricular diastolic parameters  are consistent with Grade I diastolic dysfunction (impaired relaxation).  The average left ventricular global longitudinal strain is -19.9 %. The  global longitudinal strain is normal.   2. Right ventricular systolic function is normal. The right ventricular  size is normal.   3. Left atrial size was moderately dilated.   4. The mitral valve is normal in structure. No evidence of mitral valve  regurgitation. Mild mitral stenosis. The mean mitral valve gradient is 4.5  mmHg. Severe mitral annular calcification.   5. The aortic valve has been repaired/replaced. Aortic valve  regurgitation is not visualized. No aortic stenosis is present. There is a  23 mm Edwards Sapien prosthetic (TAVR) valve present in the aortic  position. Echo findings are consistent with normal   structure and function of the aortic valve prosthesis. Aortic valve area,  by VTI measures 1.99 cm. Aortic valve mean gradient measures 15.0 mmHg.   Aortic valve Vmax measures 2.43 m/s.   6. The inferior vena cava is normal in size with greater than 50%  respiratory variability, suggesting right atrial pressure of 3 mmHg.   Comparison(s): No significant change from prior study. Prior images  reviewed side by side.     Neuro/Psych   Anxiety     Parkinson's Ds CVA, Residual Symptoms    GI/Hepatic Neg liver ROS, hiatal hernia,GERD  Medicated and Controlled,,IBS (irritable bowel syndrome)   Endo/Other  diabetes, Insulin DependentHypothyroidism    Renal/GU negative Renal ROS     Musculoskeletal Chronic lower back pain   Abdominal  (+) + obese  Peds  Hematology  (+) Blood dyscrasia, anemia   Anesthesia Other Findings   Reproductive/Obstetrics                             Anesthesia Physical Anesthesia Plan  ASA: 3  Anesthesia Plan: General   Post-op Pain Management: Tylenol PO (pre-op)*, Toradol IV (intra-op)* and Regional block*   Induction: Intravenous  PONV Risk Score and Plan: 3 and Ondansetron, Dexamethasone and Treatment may vary due to age or medical condition  Airway Management Planned: Oral ETT  Additional Equipment: None  Intra-op Plan:   Post-operative Plan:   Informed Consent: I have reviewed the patients History and Physical, chart, labs and discussed the procedure including the risks, benefits and alternatives for the proposed anesthesia with the patient or authorized representative who has indicated his/her understanding and acceptance.     Dental advisory given  Plan Discussed with: Anesthesiologist and CRNA  Anesthesia Plan Comments:  Anesthesia Quick Evaluation  

## 2022-06-01 NOTE — Op Note (Unsigned)
NAME: CATRICIA, WILKINSON MEDICAL RECORD NO: [id number] ACCOUNT NO: [id number] DATE OF BIRTH: 08-Nov-1945 FACILITY: [facility]ATION: [facility] PHYSICIAN: Doran Heater. Veverly Fells, MD  Operative Report   DATE OF PROCEDURE: 06/01/2022  PREOPERATIVE DIAGNOSIS:  Left shoulder end-stage arthritis.  POSTOPERATIVE DIAGNOSIS:  Left shoulder end-stage arthritis.  PROCEDURE PERFORMED:  Left reverse shoulder replacement using DePuy Delta Xtend prosthesis with no subscapularis repair.  ATTENDING SURGEON:  Doran Heater. Veverly Fells, MD  ASSISTANT:  Charletta Cousin [place], [place], who was scrubbed during the entire procedure, and necessary for satisfactory completion of surgery.  ANESTHESIA:  General anesthesia was used plus interscalene block.  ESTIMATED BLOOD LOSS:  150 mL  FLUID REPLACEMENT:  1000 mL crystalloid.  COUNTS:  Instrument counts correct.  COMPLICATIONS:  No complications.  ANTIBIOTICS:  Perioperative antibiotics were given.  INDICATIONS:  The patient is a 77 year old female with worsening left shoulder pain due to arthritis.  The patient has increasing pain and declining function with her shoulder related to synovitis and arthritis in her joint.  The patient has failed  conservative management, desires reverse shoulder replacement to eliminate pain and restore function to her shoulder to preserve her independence.  Informed consent obtained.  DESCRIPTION OF PROCEDURE:  After an adequate level of anesthesia was achieved, the patient was positioned in the modified beach chair position.  Left shoulder correctly identified and sterilely prepped and draped in the usual manner.  Timeout called,  verifying correct patient, correct site, we entered the patient's shoulder using a standard deltopectoral approach, starting at the coracoid process extending down to the anterior humerus using a 10 blade scalpel.  Dissection down through subcutaneous  tissues using Bovie.  We identified the cephalic vein and took that  laterally with the deltoid pectoralis taken medially.  Conjoined tendon identified and retracted medially.  We placed our deep retractors.  We tenodesed the biceps in situ with 0 Vicryl  figure-of-eight suture.  We then released the subscapularis which was thin and not repairable off the lesser tuberosity and with those traction sutures we could protect the axillary nerve, we released the inferior capsule all the way around to the  posterior portion of the shoulder.  We then externally rotated the shoulder and extended the shoulder and delivered the humeral head out of the wound.  There was significant chondromalacia noted on the entire surface of the humeral head.  There was also  quite a bit of loose bodies in the shoulder joint.  Loose pieces of cartilage was delaminated in a lot of synovitis.  We entered the proximal humerus with a 6 mm reamer, reaming up to a size 10.  We then used the T-handle 10 mm intramedullary guide and  resected the head at 20 degrees of retroversion with the oscillating saw, we removed excess osteophytes with a rongeur.  We then went ahead and subluxed the humerus posteriorly, gaining good exposure of the glenoid.  We placed our deep retractors and  removed the glenoid labrum, the capsule and the biceps stump.  We then went ahead and removed the remaining cartilage on the glenoid side.  We found our center point for our guide pin, reamed for the metaglene baseplate to subchondral bone.  We then did  our peripheral hand reaming with the T-handle reamer and then went to the central peg drill.  We drilled out that central peg hole and I irrigated thoroughly.  We then impacted the HA coated press-fit baseplate into position.  We placed a 42 screw  inferiorly into the  base and neck of the scapula.  We then did a 30 screw into the base of coracoid and due to small native glenoid anterior, posterior diameter we could only do the superior and inferior screws.  Baseplate security was  excellent.  We  selected a 38 standard glenosphere and attached that to the baseplate. No offset.  Once we had that in place, I did a finger sweep to make sure there is no soft tissue caught in between the baseplate and the glenosphere.  Next, we went back to the  humeral side, reamed for the one left metaphysis.  Her bone was extremely soft.  We then trialled with the 10 stem and 1 left metaphysis set on the 0 setting and impacted in 20 degrees of retroversion.  We reduced the shoulder with a 38+3 poly trial and  we are happy with our soft tissue balancing and tension on the conjoined tendon and there was no instability.  We removed the trial components, irrigated it thoroughly, used available bone graft from the humeral head in impaction grafting technique for  the HA coated press-fit stem, a size 10 10 mm and the one left metaphysis set on the 0 setting and impacted in 20 degrees of retroversion. With the stem in place and secured, we selected the real 38+3 poly and placed that on the humeral tray and impacted  that and with that, stable we reduced the shoulder.  Nice little pop as it reduced.  Appropriate conjoined tension and no gapping with inferior pole or external rotation.  Very stable shoulder.  We obtained all the rotator cuff that we could, subscap  not repairable we resected the remnant.  We irrigated thoroughly and then went ahead and repaired deltopectoral interval with 0 Vicryl suture followed by 2-0 Vicryl for subcutaneous closure and 4-0 Monocryl for skin.  Steri-Strips applied followed by  sterile dressing.  The patient tolerated surgery well.   PUS D: 06/01/2022 4:05:54 pm T: 06/01/2022 7:18:00 pm  JOB: [id number]

## 2022-06-01 NOTE — Care Plan (Signed)
Ortho Bundle Case Management Note  Patient Details  Name: Breanna Webster MRN: [id number] Date of Birth: 17-Jan-1946  L Rev TSA on 06-01-22 DCP:  Home with husband DME:  No needs PT:  HEP                   DME Arranged:  N/A DME Agency:  NA  HH Arranged:  NA HH Agency:  NA  Additional Comments: Please contact me with any questions of if this plan should need to change.  QUINTASHA SCHOENDORF, RN,CCM [facility]  [phone] 06/01/2022, 2:14 PM

## 2022-06-02 DIAGNOSIS — I110 Hypertensive heart disease with heart failure: Secondary | ICD-10-CM | POA: Diagnosis not present

## 2022-06-02 DIAGNOSIS — E039 Hypothyroidism, unspecified: Secondary | ICD-10-CM | POA: Diagnosis not present

## 2022-06-02 DIAGNOSIS — M19012 Primary osteoarthritis, left shoulder: Secondary | ICD-10-CM | POA: Diagnosis not present

## 2022-06-02 DIAGNOSIS — E119 Type 2 diabetes mellitus without complications: Secondary | ICD-10-CM | POA: Diagnosis not present

## 2022-06-02 DIAGNOSIS — I2510 Atherosclerotic heart disease of native coronary artery without angina pectoris: Secondary | ICD-10-CM | POA: Diagnosis not present

## 2022-06-02 DIAGNOSIS — I509 Heart failure, unspecified: Secondary | ICD-10-CM | POA: Diagnosis not present

## 2022-06-02 LAB — BASIC METABOLIC PANEL
Anion gap: 7 (ref 5–15)
BUN: 17 mg/dL (ref 8–23)
CO2: 21 mmol/L — ABNORMAL LOW (ref 22–32)
Calcium: 8.3 mg/dL — ABNORMAL LOW (ref 8.9–10.3)
Chloride: 108 mmol/L (ref 98–111)
Creatinine, Ser: 0.63 mg/dL (ref 0.44–1.00)
GFR, Estimated: >60 mL/min (ref >60)
Glucose, Bld: 188 mg/dL — ABNORMAL HIGH (ref 70–99)
Potassium: 3.5 mmol/L (ref 3.5–5.1)
Sodium: 136 mmol/L (ref 135–145)

## 2022-06-02 LAB — HEMOGLOBIN AND HEMATOCRIT, BLOOD
HCT: 29.1 % — ABNORMAL LOW (ref 36.0–46.0)
Hemoglobin: 8.8 g/dL — ABNORMAL LOW (ref 12.0–15.0)

## 2022-06-02 LAB — GLUCOSE, CAPILLARY
Glucose-Capillary: 151 mg/dL — ABNORMAL HIGH (ref 70–99)
Glucose-Capillary: 171 mg/dL — ABNORMAL HIGH (ref 70–99)
Glucose-Capillary: 192 mg/dL — ABNORMAL HIGH (ref 70–99)
Glucose-Capillary: 144 mg/dL — ABNORMAL HIGH (ref 70–99)
Glucose-Capillary: 221 mg/dL — ABNORMAL HIGH (ref 70–99)

## 2022-06-02 NOTE — Progress Notes (Signed)
Occupational Therapy Treatment Patient Details Name: Breanna Webster MRN: [id number] DOB: 04-07-1946 Today's Date: 06/02/2022   History of present illness 77 yo female s/p Left reverse shoulder replacement 06/01/22. Hx of CHF, Parkinson's syndrome, chrnic pain, OA, DM, TAVR, CVA, CHF.   OT comments  Patient's spouse, son and granddaughter present for education and instruction on shoulder precautions, how she can use her arm, how to don sling and ice cuff and how to position upper extremity in bed and chair. Patient's husband seems overwhelmed with the steps involved and will need multiple trials to perform and maintain learning. Patient's son reports his father will need a lot of practice. Will plan to return this afternoon to work on donning and doffing sling and ice cuff as well as all other education. Plan is already to see patient twice tomorrow for reiteration of education and improve confidence of both patient and spouse.    Recommendations for follow up therapy are one component of a multi-disciplinary discharge planning process, led by the attending physician.  Recommendations may be updated based on patient status, additional functional criteria and insurance authorization.    Follow Up Recommendations  Home health OT     Assistance Recommended at Discharge Frequent or constant Supervision/Assistance  Patient can return home with the following  A little help with walking and/or transfers;A lot of help with bathing/dressing/bathroom;Assistance with cooking/housework;Help with stairs or ramp for entrance;Direct supervision/assist for financial management;Direct supervision/assist for medications management   Equipment Recommendations  None recommended by OT    Recommendations for Other Services      Precautions / Restrictions Precautions Precautions: Fall;Shoulder Type of Shoulder Precautions: Sling at all times except ADL/exercise Yes   Non weight bearing Yes   AROM elbow, wrist  and hand to tolerance Yes   PROM of shoulder No   AROM of shoulder No. Okay for ROM in "beach ball area" per DR. Norris. Shoulder Interventions: Shoulder sling/immobilizer Required Braces or Orthoses: Sling Restrictions Weight Bearing Restrictions: Yes LUE Weight Bearing: Non weight bearing       Mobility Bed Mobility Overal bed mobility: Needs Assistance Bed Mobility: Supine to Sit     Supine to sit: Mod assist     General bed mobility comments: Mod assist for trunk negotiation.    Transfers Overall transfer level: Needs assistance Equipment used: None Transfers: Sit to/from Stand Sit to Stand: Min assist           General transfer comment: Min assist for standing and steadying. Min assist with hand hold from therapist to ambulate to and from bathroom with one loss of balance.     Balance Overall balance assessment: Mild deficits observed, not formally tested Sitting-balance support: No upper extremity supported, Feet supported Sitting balance-Leahy Scale: Good     Standing balance support: Single extremity supported, Reliant on assistive device for balance Standing balance-Leahy Scale: Poor                             ADL either performed or assessed with clinical judgement   ADL Overall ADL's : Needs assistance/impaired Eating/Feeding: Set up;Sitting   Grooming: Set up;Sitting   Upper Body Bathing: Moderate assistance;Sitting   Lower Body Bathing: Maximal assistance;Sit to/from stand Lower Body Bathing Details (indicate cue type and reason): assistance for steadying in standing Upper Body Dressing : Maximal assistance;Sitting;Cueing for UE precautions   Lower Body Dressing: Maximal assistance;Sit to/from stand   Toilet Transfer: Minimal assistance;Ambulation;BSC/3in1 Toilet  Transfer Details (indicate cue type and reason): Min assistfor steadying and verbal cue for hand placement Toileting- Clothing Manipulation and Hygiene: Sit to/from  stand;Sitting/lateral lean;Moderate assistance Toileting - Clothing Manipulation Details (indicate cue type and reason): for clorthing management     Functional mobility during ADLs: Minimal assistance General ADL Comments: Therapist instructed patient on how to don shirt, ice cuff and sling while maintaining shoulder precautions in presence of husband and family. Will need reiteration of instruction and multiple hands on attempts to improve confidence.    Extremity/Trunk Assessment Upper Extremity Assessment Upper Extremity Assessment: LUE deficits/detail;RUE deficits/detail RUE Deficits / Details: WFL ROM and strength RUE Sensation: WNL RUE Coordination: WNL LUE Deficits / Details: impaired motor control and sensation secondary to block. LUE Sensation: decreased light touch LUE Coordination: decreased fine motor;decreased gross motor   Lower Extremity Assessment Lower Extremity Assessment: Defer to PT evaluation   Cervical / Trunk Assessment Cervical / Trunk Assessment: Normal    Vision Patient Visual Report: No change from baseline     Perception     Praxis      Cognition Arousal/Alertness: Awake/alert Behavior During Therapy: WFL for tasks assessed/performed Overall Cognitive Status: Within Functional Limits for tasks assessed                                 General Comments: Improved cognition but still needing continuous repeating of instruction. Husband present and performing tasks but also needing a lot of cues. He appears overwhelmed and will need reiteration of instruction.              General Comments      Pertinent Vitals/ Pain       Pain Assessment Pain Assessment: Faces Pain Score: 5  Faces Pain Scale: Hurts even more Pain Location: L shoulder Pain Descriptors / Indicators: Spasm, Aching, Discomfort Pain Intervention(s): Limited activity within patient's tolerance, Patient requesting pain meds-RN notified  Home Living Family/patient  expects to be discharged to:: Private residence Living Arrangements: Spouse/significant other Available Help at Discharge: Family;Available 24 hours/day Type of Home: House Home Access: Level entry     Home Layout: One level     Bathroom Shower/Tub: Occupational psychologist: Standard     Home Equipment: Cane - single Barista (2 wheels)          Prior Functioning/Environment              Frequency  Min 2X/week        Progress Toward Goals  OT Goals(current goals can now be found in the care plan section)  Progress towards OT goals: Progressing toward goals  Acute Rehab OT Goals Patient Stated Goal: to go home safely OT Goal Formulation: With patient Time For Goal Achievement: 06/16/22 Potential to Achieve Goals: Good ADL Goals Additional ADL Goal #1: Patient's will don sling and ice cuff with confidence. Additional ADL Goal #2: Patient and spouse will verbalize understanding and report confidence prior to discharge.  Plan Discharge plan remains appropriate    Co-evaluation                 AM-PAC OT "6 Clicks" Daily Activity     Outcome Measure   Help from another person eating meals?: A Little Help from another person taking care of personal grooming?: A Little Help from another person toileting, which includes using toliet, bedpan, or urinal?: A Lot Help from another person bathing (including  washing, rinsing, drying)?: A Lot Help from another person to put on and taking off regular upper body clothing?: A Lot Help from another person to put on and taking off regular lower body clothing?: A Lot 6 Click Score: 14    End of Session Equipment Utilized During Treatment: Gait belt  OT Visit Diagnosis: Unsteadiness on feet (R26.81);Pain Pain - Right/Left: Left Pain - part of body: Shoulder   Activity Tolerance Patient limited by pain   Patient Left with family/visitor present;in bed;with chair alarm set;with bed alarm set    Nurse Communication Other (comment) (Not able to discharge, needs more education)        Time: [id number] OT Time Calculation (min): 50 min  Charges: OT General Charges $OT Visit: 1 Visit $Self Care/Home Management : 38-52 mins  Gustavo Lah, OTR/L [place]  Office [phone]   Lenward Chancellor 06/02/2022, 2:12 PM

## 2022-06-02 NOTE — Progress Notes (Signed)
Occupational Therapy Treatment Patient Details Name: Breanna Webster MRN: [id number] DOB: 08-20-1945 Today's Date: 06/02/2022   History of present illness 77 yo female s/p Left reverse shoulder replacement 06/01/22. Hx of CHF, Parkinson's syndrome, chrnic pain, OA, DM, TAVR, CVA, CHF.   OT comments  Met with patient and family for family training. Patient needed min assist for standing and was unsteady initially. Instructed patient's husband on use of gait belt for safety. In seated position - had husband remove sling and ice cuff - he still needed cues. Patient in pain with minimal touching and movement. Therapist went through elbow, wrist and hand exercises - patient needing tactile cues to perform correctly and at times active assist. She is initially very stiff - unsure if from pain or something else. She says she cannot relax. She has had Robaxin. Went through positioning of UE on pillows to support arm and had husband attempt to don ice cuff. He needed 80% verbal cues to do so correctly. He becomes very overwhelmed with task and cannot problem solve straps. In his mind - the sling and cuff are combined. Recommended not using the ice cooler and cuff at this time as patient's husband cannot don and doff correctly. Asked husband to buy some gel cold packs. Will f/u with patient and husband tomorrow for continued education.    Recommendations for follow up therapy are one component of a multi-disciplinary discharge planning process, led by the attending physician.  Recommendations may be updated based on patient status, additional functional criteria and insurance authorization.    Follow Up Recommendations  Home health OT     Assistance Recommended at Discharge Frequent or constant Supervision/Assistance  Patient can return home with the following  A little help with walking and/or transfers;A lot of help with bathing/dressing/bathroom;Assistance with cooking/housework;Help with stairs or ramp  for entrance;Direct supervision/assist for financial management;Direct supervision/assist for medications management   Equipment Recommendations  None recommended by OT    Recommendations for Other Services      Precautions / Restrictions Precautions Precautions: Fall;Shoulder Type of Shoulder Precautions: Sling at all times except ADL/exercise Yes   Non weight bearing Yes   AROM elbow, wrist and hand to tolerance Yes   PROM of shoulder No   AROM of shoulder No. Okay for ROM in "beach ball area" per DR. Norris. Shoulder Interventions: Shoulder sling/immobilizer Required Braces or Orthoses: Sling Restrictions Weight Bearing Restrictions: Yes LUE Weight Bearing: Non weight bearing       Mobility Bed Mobility Overal bed mobility: Needs Assistance Bed Mobility: Supine to Sit     Supine to sit: Min assist     General bed mobility comments: Min assist to sit up at edge of bed.    Transfers Overall transfer level: Needs assistance Equipment used: Straight cane Transfers: Sit to/from Stand Sit to Stand: Min assist           General transfer comment: Min assist to stand and steady due to posterior bias. Min guard to ambulate around the bed with use of gait belt for safetly. Patient's husband educated on use of gait belt for safety     Balance Overall balance assessment: Mild deficits observed, not formally tested Sitting-balance support: No upper extremity supported, Feet supported Sitting balance-Leahy Scale: Good     Standing balance support: Single extremity supported Standing balance-Leahy Scale: Poor  ADL either performed or assessed with clinical judgement   ADL Overall ADL's : Needs assistance/impaired Eating/Feeding: Set up;Sitting   Grooming: Set up;Sitting   Upper Body Bathing: Moderate assistance;Sitting   Lower Body Bathing: Maximal assistance;Sit to/from stand Lower Body Bathing Details (indicate cue type and  reason): assistance for steadying in standing Upper Body Dressing : Maximal assistance;Sitting;Cueing for UE precautions   Lower Body Dressing: Maximal assistance;Sit to/from stand   Toilet Transfer: Minimal assistance;Ambulation;BSC/3in1 Armed forces technical officer Details (indicate cue type and reason): Min assistfor steadying and verbal cue for hand placement Toileting- Clothing Manipulation and Hygiene: Sit to/from stand;Sitting/lateral lean;Moderate assistance Toileting - Clothing Manipulation Details (indicate cue type and reason): for clorthing management     Functional mobility during ADLs: Minimal assistance General ADL Comments: Therapist reiterated how to don and doff sling. Patient's husband is unable to problem solve the steps to don ice cuff. He does not do well with following her instructions.    Extremity/Trunk Assessment Upper Extremity Assessment Upper Extremity Assessment: LUE deficits/detail;RUE deficits/detail RUE Deficits / Details: WFL ROM and strength RUE Sensation: WNL RUE Coordination: WNL LUE Deficits / Details: impaired motor control and sensation secondary to block. LUE Sensation: decreased light touch LUE Coordination: decreased fine motor;decreased gross motor   Lower Extremity Assessment Lower Extremity Assessment: Defer to PT evaluation   Cervical / Trunk Assessment Cervical / Trunk Assessment: Normal    Vision Patient Visual Report: No change from baseline     Perception     Praxis      Cognition Arousal/Alertness: Awake/alert Behavior During Therapy: WFL for tasks assessed/performed Overall Cognitive Status: Within Functional Limits for tasks assessed                                 General Comments: Has some retention issues. Continues to ask the same questions and still asking questions in regards to sling and ice cuff.        Exercises      Shoulder Instructions Reiterated shoulder precautions, positioning, donning and doffing  splint. Reiterated she could come out of the sling and use her arm in a limited area and perform lap slides. Reiterated and went through exercises.     General Comments      Pertinent Vitals/ Pain       Pain Assessment Pain Assessment: Faces Faces Pain Scale: Hurts whole lot Pain Location: L shoulder Pain Descriptors / Indicators: Spasm, Aching, Discomfort Pain Intervention(s): Limited activity within patient's tolerance  Home Living                                          Prior Functioning/Environment              Frequency  Min 2X/week        Progress Toward Goals  OT Goals(current goals can now be found in the care plan section)  Progress towards OT goals: OT to reassess next treatment  Acute Rehab OT Goals Patient Stated Goal: to go home safely OT Goal Formulation: With patient Time For Goal Achievement: 06/16/22 Potential to Achieve Goals: Fair ADL Goals Additional ADL Goal #1: Patient's will don sling and ice cuff with confidence. Additional ADL Goal #2: Patient and spouse will verbalize understanding and report confidence prior to discharge.  Plan Discharge plan remains appropriate    Co-evaluation  AM-PAC OT "6 Clicks" Daily Activity     Outcome Measure   Help from another person eating meals?: A Little Help from another person taking care of personal grooming?: A Little Help from another person toileting, which includes using toliet, bedpan, or urinal?: A Lot Help from another person bathing (including washing, rinsing, drying)?: A Lot Help from another person to put on and taking off regular upper body clothing?: A Lot Help from another person to put on and taking off regular lower body clothing?: A Lot 6 Click Score: 14    End of Session Equipment Utilized During Treatment: Gait belt  OT Visit Diagnosis: Unsteadiness on feet (R26.81);Pain Pain - Right/Left: Left Pain - part of body: Shoulder    Activity Tolerance Patient limited by pain   Patient Left with family/visitor present;in bed;with chair alarm set;with bed alarm set   Nurse Communication Other (comment) (may need stronger pain medications, ice machine needs to be filled throughout the night, cannot discharge)        Time: [id number] OT Time Calculation (min): 55 min  Charges: OT General Charges $OT Visit: 1 Visit  OT Treatments $Self Care/Home Management : 53-67 mins  Gustavo Lah, OTR/L [place]  Office [phone]   Lenward Chancellor 06/02/2022, 4:52 PM

## 2022-06-02 NOTE — Evaluation (Signed)
Physical Therapy Evaluation Patient Details Name: Breanna Webster MRN: [id number] DOB: 1945-07-02 Today's Date: 06/02/2022  History of Present Illness  77 yo female s/p Left reverse shoulder replacement 06/01/22. Hx of CHF, Parkinson's syndrome, chrnic pain, OA, DM, TAVR, CVA, CHF.  Clinical Impression  On eval, pt required Min guard A for mobility. She walked ~175 feet with a straight cane. She is unsteady and fatigues with increased activity. O2 91% on RA, dyspnea 2/4 with ambulation. Pt presents with general weakness, decreased activity tolerance, and impaired gait and balance. OT is following for shoulder eval/tx. Will recommend HHPT at this time. Will plan to follow pt during this hospital stay.        Recommendations for follow up therapy are one component of a multi-disciplinary discharge planning process, led by the attending physician.  Recommendations may be updated based on patient status, additional functional criteria and insurance authorization.  Follow Up Recommendations Home health PT      Assistance Recommended at Discharge Frequent or constant Supervision/Assistance  Patient can return home with the following  A little help with walking and/or transfers;A little help with bathing/dressing/bathroom;Assistance with cooking/housework;Assist for transportation;Help with stairs or ramp for entrance    Equipment Recommendations None recommended by PT  Recommendations for Other Services       Functional Status Assessment Patient has had a recent decline in their functional status and demonstrates the ability to make significant improvements in function in a reasonable and predictable amount of time.     Precautions / Restrictions Precautions Precautions: Fall;Shoulder Shoulder Interventions: Shoulder sling/immobilizer Required Braces or Orthoses: Sling Restrictions Weight Bearing Restrictions: Yes LUE Weight Bearing: Non weight bearing      Mobility  Bed Mobility                General bed mobility comments: oob in recliner    Transfers Overall transfer level: Needs assistance Equipment used: Straight cane Transfers: Sit to/from Stand Sit to Stand: Min guard           General transfer comment: Increased time. Cues for safety, technique, hand placement. Close guard for safety. Unsteady.    Ambulation/Gait Ambulation/Gait assistance: Min guard Gait Distance (Feet): 175 Feet Assistive device: Straight cane Gait Pattern/deviations: Step-through pattern, Decreased stride length       General Gait Details: Stability improved as distance increased however pt also had some dyspnea with increased activity: 02 91% on RA, dyspnea 2/4. 3 brief standing rest breaks taken during walk.  Stairs            Wheelchair Mobility    Modified Rankin (Stroke Patients Only)       Balance Overall balance assessment: Needs assistance         Standing balance support: Single extremity supported, Reliant on assistive device for balance, During functional activity Standing balance-Leahy Scale: Poor                               Pertinent Vitals/Pain Pain Assessment Pain Assessment: Faces Faces Pain Scale: Hurts little more Pain Location: L shoulder Pain Descriptors / Indicators: Spasm, Aching, Discomfort Pain Intervention(s): Limited activity within patient's tolerance, Monitored during session, Ice applied, Repositioned    Home Living Family/patient expects to be discharged to:: Private residence Living Arrangements: Spouse/significant other Available Help at Discharge: Family;Available 24 hours/day Type of Home: House Home Access: Level entry       Home Layout: One level Home Equipment: Kasandra Knudsen -  single point;Rolling Walker (2 wheels)      Prior Function Prior Level of Function : Independent/Modified Independent             Mobility Comments: used cane for ambulation       Hand Dominance   Dominant Hand:  Right    Extremity/Trunk Assessment   Upper Extremity Assessment Upper Extremity Assessment: Defer to OT evaluation    Lower Extremity Assessment Lower Extremity Assessment: Generalized weakness    Cervical / Trunk Assessment Cervical / Trunk Assessment: Normal  Communication   Communication: No difficulties  Cognition Arousal/Alertness: Awake/alert Behavior During Therapy: WFL for tasks assessed/performed Overall Cognitive Status: Within Functional Limits for tasks assessed                                          General Comments      Exercises     Assessment/Plan    PT Assessment Patient needs continued PT services  PT Problem List Decreased strength;Decreased activity tolerance;Decreased balance;Decreased mobility;Decreased knowledge of use of DME       PT Treatment Interventions DME instruction;Therapeutic exercise;Gait training;Balance training;Functional mobility training;Therapeutic activities;Patient/family education    PT Goals (Current goals can be found in the Care Plan section)  Acute Rehab PT Goals Patient Stated Goal: home soon PT Goal Formulation: With patient Time For Goal Achievement: 06/16/22 Potential to Achieve Goals: Good    Frequency Min 3X/week     Co-evaluation               AM-PAC PT "6 Clicks" Mobility  Outcome Measure Help needed turning from your back to your side while in a flat bed without using bedrails?: A Little Help needed moving from lying on your back to sitting on the side of a flat bed without using bedrails?: A Little Help needed moving to and from a bed to a chair (including a wheelchair)?: A Little Help needed standing up from a chair using your arms (e.g., wheelchair or bedside chair)?: A Little Help needed to walk in hospital room?: A Little Help needed climbing 3-5 steps with a railing? : A Little 6 Click Score: 18    End of Session Equipment Utilized During Treatment: Gait belt Activity  Tolerance: Patient tolerated treatment well;Patient limited by fatigue Patient left: in chair;with call bell/phone within reach;with chair alarm set   PT Visit Diagnosis: Unsteadiness on feet (R26.81);Muscle weakness (generalized) (M62.81)    Time: [id number] PT Time Calculation (min) (ACUTE ONLY): 28 min   Charges:   PT Evaluation $PT Eval Low Complexity: 1 Low PT Treatments $Gait Training: 8-22 mins           Doreatha Massed, PT Acute Rehabilitation  Office: [phone]

## 2022-06-02 NOTE — Progress Notes (Signed)
Orthopedics Progress Note  Subjective: Patient complaining of some pain in the shoulder as the block wears off.  She did have a Tramadol which helped.   Objective:  Vitals:   06/02/22 0154 06/02/22 0551  BP: 125/60 (!) 140/77  Pulse: 73 78  Resp: 20 20  Temp: (!) 97.5 F (36.4 C) 97.6 F (36.4 C)  SpO2: 99% 99%    General: Awake and alert  Musculoskeletal: Left shoulder incision CDI, Aquacel placed Neurovascularly intact  Lab Results  Component Value Date   WBC 6.2 05/16/2022   HGB 8.8 (L) 06/02/2022   HCT 29.1 (L) 06/02/2022   MCV 66.0 (L) 05/16/2022   PLT 158 05/16/2022       Component Value Date/Time   NA 136 06/02/2022 0335   NA 137 02/09/2022 1137   K 3.5 06/02/2022 0335   CL 108 06/02/2022 0335   CO2 21 (L) 06/02/2022 0335   GLUCOSE 188 (H) 06/02/2022 0335   GLUCOSE 135 09/08/2008 0000   BUN 17 06/02/2022 0335   BUN 18 02/09/2022 1137   CREATININE 0.63 06/02/2022 0335   CREATININE 0.87 02/26/2022 1037   CALCIUM 8.3 (L) 06/02/2022 0335   GFRNONAA >60 06/02/2022 0335   GFRNONAA >60 02/26/2022 1037   GFRAA 99 12/05/2020 0000   GFRAA >60 08/14/2019 1025    Lab Results  Component Value Date   INR 1.6 (H) 12/27/2020   INR 1.8 (H) 09/08/2020   INR 1.2 04/19/2020    Assessment/Plan: POD #1 s/p Procedure(s): REVERSE SHOULDER ARTHROPLASTY Stable this AM. OOB, OT Discharge if stable and cleared by therapy.  Follow up with me in two weeks in the office  Remo Lipps R. Veverly Fells, MD 06/02/2022 8:10 AM

## 2022-06-02 NOTE — Evaluation (Addendum)
Occupational Therapy Evaluation Patient Details Name: Breanna Webster MRN: [id number] DOB: [birth year] Today's Date: 06/02/2022   History of Present Illness 77 yo female s/p Left reverse shoulder replacement 06/01/22. Hx of CHF, Parkinson's syndrome, chrnic pain, OA, DM, TAVR, CVA, CHF.   Clinical Impression   Breanna Webster is a 77 year old woman who presents with pain, non functional use of LUE with shoulder precautions and impaired balance. Patient requiring min assist for safe ambulation with one loss of balance that therapist had to correct. She is requiring mod-max assist for UB and LB ADLs and toileting. She is not comprehending education well and asking questions over and over. Deferred bulk of education for when family arrives. Patient will benefit from skilled OT services while in hospital to improve deficits and learn compensatory strategies as needed in order to return home safely. Patient reporting pain and therapist applied ice cuff. Patient currently not safe to discharge home and will need another OT treatment.   ** After patient's evaluation made a plan with son to have him and his father present for education.         Recommendations for follow up therapy are one component of a multi-disciplinary discharge planning process, led by the attending physician.  Recommendations may be updated based on patient status, additional functional criteria and insurance authorization.   Follow Up Recommendations  Home health OT     Assistance Recommended at Discharge Frequent or constant Supervision/Assistance  Patient can return home with the following A little help with walking and/or transfers;A lot of help with bathing/dressing/bathroom;Assistance with cooking/housework;Help with stairs or ramp for entrance;Direct supervision/assist for financial management;Direct supervision/assist for medications management    Functional Status Assessment  Patient has had a recent decline in  their functional status and demonstrates the ability to make significant improvements in function in a reasonable and predictable amount of time.  Equipment Recommendations  None recommended by OT    Recommendations for Other Services       Precautions / Restrictions Precautions Precautions: Fall;Shoulder Type of Shoulder Precautions: Sling at all times except ADL/exercise Yes   Non weight bearing Yes   AROM elbow, wrist and hand to tolerance Yes   PROM of shoulder No   AROM of shoulder No. Okay for ROM in "beach ball area" per DR. Norris. Shoulder Interventions: Shoulder sling/immobilizer Required Braces or Orthoses: Sling Restrictions Weight Bearing Restrictions: Yes LUE Weight Bearing: Non weight bearing      Mobility Bed Mobility Overal bed mobility: Needs Assistance Bed Mobility: Supine to Sit     Supine to sit: Mod assist     General bed mobility comments: Mod assist for trunk negotiation.    Transfers Overall transfer level: Needs assistance Equipment used: None Transfers: Sit to/from Stand Sit to Stand: Min assist           General transfer comment: Min assist for standing and steadying. Min assist with hand hold from therapist to ambulate to and from bathroom with one loss of balance.      Balance Overall balance assessment: Needs assistance Sitting-balance support: No upper extremity supported, Feet supported Sitting balance-Leahy Scale: Good     Standing balance support: Single extremity supported, Reliant on assistive device for balance Standing balance-Leahy Scale: Poor                             ADL either performed or assessed with clinical judgement   ADL Overall  ADL's : Needs assistance/impaired Eating/Feeding: Set up;Sitting   Grooming: Set up;Sitting   Upper Body Bathing: Moderate assistance;Sitting   Lower Body Bathing: Maximal assistance;Sit to/from stand Lower Body Bathing Details (indicate cue type and reason):  assistance for steadying in standing Upper Body Dressing : Maximal assistance;Sitting;Cueing for UE precautions   Lower Body Dressing: Maximal assistance;Sit to/from stand   Toilet Transfer: Minimal assistance;Ambulation;BSC/3in1 Armed forces technical officer Details (indicate cue type and reason): Min assistfor steadying and verbal cue for hand placement Toileting- Clothing Manipulation and Hygiene: Sit to/from stand;Sitting/lateral lean;Moderate assistance Toileting - Clothing Manipulation Details (indicate cue type and reason): for clorthing management     Functional mobility during ADLs: Minimal assistance General ADL Comments: Min assist and hand hold     Vision Patient Visual Report: No change from baseline       Perception     Praxis      Pertinent Vitals/Pain Pain Assessment Pain Assessment: Faces Pain Score: 5  Faces Pain Scale: Hurts little more Pain Location: L shoulder Pain Descriptors / Indicators: Spasm, Aching, Discomfort Pain Intervention(s): Limited activity within patient's tolerance, Ice applied     Hand Dominance Right   Extremity/Trunk Assessment Upper Extremity Assessment Upper Extremity Assessment: LUE deficits/detail;RUE deficits/detail RUE Deficits / Details: WFL ROM and strength RUE Sensation: WNL RUE Coordination: WNL LUE Deficits / Details: impaired motor control and sensation secondary to block. LUE Sensation: decreased light touch LUE Coordination: decreased fine motor;decreased gross motor   Lower Extremity Assessment Lower Extremity Assessment: Defer to PT evaluation   Cervical / Trunk Assessment Cervical / Trunk Assessment: Normal   Communication Communication Communication: No difficulties   Cognition Arousal/Alertness: Awake/alert Behavior During Therapy: WFL for tasks assessed/performed Overall Cognitive Status: Within Functional Limits for tasks assessed                                 General Comments: Questional memory  and appearing to not comprehend therapist instruction. Will need to educate family to make sure all education/instruction information is retained.     General Comments       Exercises     Shoulder Instructions Shoulder Instructions Donning/doffing shirt without moving shoulder:  (needs family training) Method for sponge bathing under operated UE:  (needs family training) Donning/doffing sling/immobilizer:  (needs family training) Correct positioning of sling/immobilizer:  (needs more training) ROM for elbow, wrist and digits of operated UE:  (needs family training) Sling wearing schedule (on at all times/off for ADL's):  (needs family training) Proper positioning of operated UE when showering: Independent (needs family training) Dressing change:  (needs family training) Positioning of UE while sleeping:  (needs family training)    Home Living Family/patient expects to be discharged to:: Private residence Living Arrangements: Spouse/significant other Available Help at Discharge: Family;Available 24 hours/day Type of Home: House Home Access: Level entry     Home Layout: One level     Bathroom Shower/Tub: Occupational psychologist: Standard     Home Equipment: Cane - single Barista (2 wheels)          Prior Functioning/Environment Prior Level of Function : Independent/Modified Independent             Mobility Comments: used cane for ambulation          OT Problem List: Decreased strength;Decreased range of motion;Impaired balance (sitting and/or standing);Decreased knowledge of precautions;Decreased knowledge of use of DME or AE;Pain;Obesity;Impaired UE functional use;Impaired  sensation;Decreased coordination;Decreased activity tolerance;Decreased safety awareness      OT Treatment/Interventions: Self-care/ADL training;Therapeutic exercise;DME and/or AE instruction;Therapeutic activities;Balance training;Patient/family education    OT  Goals(Current goals can be found in the care plan section) Acute Rehab OT Goals Patient Stated Goal: to go home safely OT Goal Formulation: With patient Time For Goal Achievement: 06/16/22 Potential to Achieve Goals: Good  OT Frequency: Min 2X/week    Co-evaluation              AM-PAC OT "6 Clicks" Daily Activity     Outcome Measure Help from another person eating meals?: A Little Help from another person taking care of personal grooming?: A Little Help from another person toileting, which includes using toliet, bedpan, or urinal?: A Lot Help from another person bathing (including washing, rinsing, drying)?: A Lot Help from another person to put on and taking off regular upper body clothing?: A Lot Help from another person to put on and taking off regular lower body clothing?: A Lot 6 Click Score: 14   End of Session Equipment Utilized During Treatment: Gait belt Nurse Communication:  (not able to discharge, needs family training)  Activity Tolerance: Patient limited by pain Patient left: in chair;with call bell/phone within reach;with chair alarm set  OT Visit Diagnosis: Unsteadiness on feet (R26.81);Pain Pain - Right/Left: Left Pain - part of body: Shoulder                Time: 0829-0900 OT Time Calculation (min): 31 min Charges:  OT General Charges $OT Visit: 1 Visit OT Evaluation $OT Eval Low Complexity: 1 Low OT Treatments $Self Care/Home Management : 8-22 mins  Gustavo Lah, OTR/L [place]  Office [phone]   Lenward Chancellor 06/02/2022, 2:02 PM

## 2022-06-02 NOTE — Discharge Summary (Signed)
In most cases prophylactic antibiotics for Dental procdeures after total joint surgery are not necessary.  Exceptions are as follows:  1. History of prior total joint infection  2. Severely immunocompromised (Organ Transplant, cancer chemotherapy, Rheumatoid biologic meds such as [place])  3. Poorly controlled diabetes (A1C &gt; 8.0, blood glucose over 200)  If you have one of these conditions, contact your surgeon for an antibiotic prescription, prior to your dental procedure. Orthopedic Discharge Summary        Physician Discharge Summary  Patient ID: Breanna Webster MRN: [id number] DOB/AGE: 09-14-1945 78 y.o.  Admit date: 06/01/2022 Discharge date: 06/02/2022   Procedures:  Procedure(s) (LRB): REVERSE SHOULDER ARTHROPLASTY (Left)  Attending Physician:  Dr. Esmond Plants  Admission Diagnoses:   Left shoulder OA  Discharge Diagnoses:  same   Past Medical History:  Diagnosis Date   Anxiety    Arthritis    "back" (04/22/2018)   Back pain    Blood transfusion without reported diagnosis    CAD (coronary artery disease)    a. 03/2018 s/p PCI/DES to the RCA (3.0x15 Onyx DES).   Carotid artery stenosis    Mild   Chest pain    CHF (congestive heart failure) (HCC)    Chronic lower back pain    Cirrhosis (HCC)    Colon polyps    Diverticulitis    Diverticulosis    Esophageal thickening    seen on pre TAVR CT scan, also questionable cirrhosis. MRI recommended. Will refer to GI   GERD (gastroesophageal reflux disease)    Grave's disease    Heart murmur    History of colonic polyps 05/22/2017   History of hiatal hernia    Hyperlipidemia    Hypertension    Hypothyroidism    IBS (irritable bowel syndrome)    Osteopenia    Parkinson's syndrome 04/23/2020   Pulmonary nodules    seen on pre TAVR CT. likley benign. no follow up recommended if pt low risk.   S/P TAVR (transcatheter aortic valve replacement)    Shortness of breath on exertion    Stroke [facility])  2021   x2   Thalassemia minor    Thyroid disease    Type II diabetes mellitus ([place])     PCP: [name], NP   Discharged Condition: good  Hospital Course:  Patient underwent the above stated procedure on 06/01/2022. Patient tolerated the procedure well and brought to the recovery room in good condition and subsequently to the floor. Patient had an uncomplicated hospital course and was stable for discharge.   Disposition: Discharge disposition: 01-Home or Self Care      with follow up in 2 weeks    Follow-up Information     Netta Cedars, MD. Call in 2 week(s).   Specialty: Orthopedic Surgery Why: call [phone] for appt with Dr Veverly Fells in two weeks Contact information: [address] [place] [id number]                 Dental Antibiotics:  In most cases prophylactic antibiotics for Dental procdeures after total joint surgery are not necessary.  Exceptions are as follows:  1. History of prior total joint infection  2. Severely immunocompromised (Organ Transplant, cancer chemotherapy, Rheumatoid biologic meds such as [place])  3. Poorly controlled diabetes (A1C &gt; 8.0, blood glucose over 200)  If you have one of these conditions, contact your surgeon for an antibiotic prescription, prior to your dental procedure.  Discharge Instructions  Call MD / Call 911   Complete by: As directed    If you experience chest pain or shortness of breath, CALL 911 and be transported to the hospital emergency room.  If you develope a fever above 101 F, pus (white drainage) or increased drainage or redness at the wound, or calf pain, call your surgeon's office.   Constipation Prevention   Complete by: As directed    Drink plenty of fluids.  Prune juice may be helpful.  You may use a stool softener, such as Colace (over the counter) 100 mg twice a day.  Use MiraLax (over the counter) for constipation as needed.   Diet - low sodium heart  healthy   Complete by: As directed    Increase activity slowly as tolerated   Complete by: As directed    Post-operative opioid taper instructions:   Complete by: As directed    POST-OPERATIVE OPIOID TAPER INSTRUCTIONS: It is important to wean off of your opioid medication as soon as possible. If you do not need pain medication after your surgery it is ok to stop day one. Opioids include: Codeine, Hydrocodone(Norco, Vicodin), Oxycodone(Percocet, oxycontin) and hydromorphone amongst others.  Long term and even short term use of opiods can cause: Increased pain response Dependence Constipation Depression Respiratory depression And more.  Withdrawal symptoms can include Flu like symptoms Nausea, vomiting And more Techniques to manage these symptoms Hydrate well Eat regular healthy meals Stay active Use relaxation techniques(deep breathing, meditating, yoga) Do Not substitute Alcohol to help with tapering If you have been on opioids for less than two weeks and do not have pain than it is ok to stop all together.  Plan to wean off of opioids This plan should start within one week post op of your joint replacement. Maintain the same interval or time between taking each dose and first decrease the dose.  Cut the total daily intake of opioids by one tablet each day Next start to increase the time between doses. The last dose that should be eliminated is the evening dose.          Allergies as of 06/02/2022   No Known Allergies      Medication List     TAKE these medications    acetaminophen 500 MG tablet Commonly known as: TYLENOL Take 500 mg by mouth every 6 (six) hours as needed for mild pain.   amoxicillin-clavulanate 875-125 MG tablet Commonly known as: AUGMENTIN Take 1 tablet by mouth 2 (two) times daily.   b complex vitamins capsule Take 1 capsule by mouth daily.   BD Pen Needle Nano 2nd Gen 32G X 4 MM Misc Generic drug: Insulin Pen Needle   Unifine  Pentips 32G X 4 MM Misc Generic drug: Insulin Pen Needle Inject 4 times subcutaneously   Cartia XT 120 MG 24 hr capsule Generic drug: diltiazem Take 1 capsule (120 mg total) by mouth daily.   cholecalciferol 25 MCG (1000 UNIT) tablet Commonly known as: VITAMIN D3 Take 1,000 Units by mouth daily.   Eliquis 5 MG Tabs tablet Generic drug: apixaban Take 1 tablet (5 mg total) by mouth 2 (two) times daily.   escitalopram 20 MG tablet Commonly known as: LEXAPRO TAKE ONE TABLET BY MOUTH DAILY What changed:  how much to take how to take this when to take this   Fluad Quadrivalent 0.5 ML injection Generic drug: influenza vaccine adjuvanted Inject into the muscle.   fluconazole 150 MG tablet Commonly known as: Diflucan Take 1  tablet (150 mg total) by mouth daily.   FreeStyle Libre 3 Sensor Misc Place one sensor every 14 days   FreeStyle [employer] Use as directed   furosemide 20 MG tablet Commonly known as: LASIX Take 2 tablets (71m) by mouth daily on 9/13, 9/14, and 9/15. Then take 1 tablet (281m by mouth daily. What changed:  how much to take how to take this when to take this additional instructions   Gemtesa 75 MG Tabs Generic drug: Vibegron Take 1 tablet by mouth daily. What changed: when to take this   HumuLIN R 100 units/mL injection Generic drug: insulin regular Inject 10 Units total into the skin 3 (three) times daily before meals.   NovoLIN R FlexPen ReliOn 100 UNIT/ML KwikPen Generic drug: Insulin Regular Human Inject 5 to 10 units under the skin three times daily.   insulin NPH Human 100 UNIT/ML injection Commonly known as: HumuLIN N Inject 20 units under the skin in the morning and 14 units in evening What changed: Another medication with the same name was changed. Make sure you understand how and when to take each.   HumuLIN N KwikPen 100 UNIT/ML Kiwkpen Generic drug: Insulin NPH (Human) (Isophane) Inject 20 units into the skin every  morning and 14 units every evening. What changed:  how much to take when to take this   losartan 50 MG tablet Commonly known as: COZAAR TAKE ONE TABLET BY MOUTH DAILY What changed:  how much to take how to take this when to take this   meclizine 25 MG tablet Commonly known as: ANTIVERT Take 1 tablet (25 mg total) by mouth 3 (three) times daily as needed for dizziness.   metFORMIN 500 MG 24 hr tablet Commonly known as: GLUCOPHAGE-XR Take 1 tablet by mouth 2 times daily   methocarbamol 500 MG tablet Commonly known as: ROBAXIN Take 1 tablet (500 mg total) by mouth 4 (four) times daily. What changed: Another medication with the same name was added. Make sure you understand how and when to take each.   methocarbamol 500 MG tablet Commonly known as: ROBAXIN Take 1 tablet (500 mg total) by mouth every 8 (eight) hours as needed for muscle spasms. What changed: You were already taking a medication with the same name, and this prescription was added. Make sure you understand how and when to take each.   multivitamin with minerals Tabs tablet Take 1 tablet by mouth daily.   nitroGLYCERIN 0.4 MG SL tablet Commonly known as: NITROSTAT Place 1 tablet (0.4 mg total) under the tongue every 5 (five) minutes as needed for chest pain.   omeprazole 20 MG capsule Commonly known as: PRILOSEC Take 1 capsule (20 mg total) by mouth 2 (two) times daily before a meal. What changed: when to take this   ondansetron 4 MG tablet Commonly known as: Zofran Take 1 tablet (4 mg total) by mouth every 8 (eight) hours as needed for nausea or vomiting.   OneTouch Delica Plus La0000000isc Use twice daily   OneTouch Verio test strip Generic drug: glucose blood USE TO CHECK BLOOD SUGAR THREE TIMES DAILY   potassium chloride SA 20 MEQ tablet Commonly known as: KLOR-CON M Take 1 tablet (20 mEq total) by mouth daily as needed. Take with Furosemide (Lasix).   pramipexole 0.125 MG tablet Commonly known  as: MIRAPEX Take 1 tablet (0.125 mg total) by mouth 3 (three) times daily.   Repatha SureClick 14XX123456G/ML Soaj Generic drug: Evolocumab Inject 1 dose into the  skin every 14 (fourteen) days.   Synthroid 137 MCG tablet Generic drug: levothyroxine Take 1 tablet by mouth every morning on an empty stomach  BRAND NAME ONLY   traMADol 50 MG tablet Commonly known as: Ultram Take 1 tablet (50 mg total) by mouth every 6 (six) hours as needed for moderate pain or severe pain.   UltiCare Insulin Syringe 31G X 5/16" 1 ML Misc Generic drug: Insulin Syringe-Needle U-100 Inject 4 times daily subcutaneously          Signed: Augustin Schooling 06/02/2022, 8:13 AM  [facility] is now [facility] [address], [place], [place], [place] [address] Phone: [place]

## 2022-06-03 DIAGNOSIS — I110 Hypertensive heart disease with heart failure: Secondary | ICD-10-CM | POA: Diagnosis not present

## 2022-06-03 DIAGNOSIS — M19012 Primary osteoarthritis, left shoulder: Secondary | ICD-10-CM | POA: Diagnosis not present

## 2022-06-03 DIAGNOSIS — I2510 Atherosclerotic heart disease of native coronary artery without angina pectoris: Secondary | ICD-10-CM | POA: Diagnosis not present

## 2022-06-03 DIAGNOSIS — I509 Heart failure, unspecified: Secondary | ICD-10-CM | POA: Diagnosis not present

## 2022-06-03 DIAGNOSIS — E039 Hypothyroidism, unspecified: Secondary | ICD-10-CM | POA: Diagnosis not present

## 2022-06-03 DIAGNOSIS — E119 Type 2 diabetes mellitus without complications: Secondary | ICD-10-CM | POA: Diagnosis not present

## 2022-06-03 LAB — GLUCOSE, CAPILLARY
Glucose-Capillary: 178 mg/dL — ABNORMAL HIGH (ref 70–99)
Glucose-Capillary: 149 mg/dL — ABNORMAL HIGH (ref 70–99)

## 2022-06-03 MED ORDER — MIRABEGRON ER 25 MG PO TB24
25.0000 mg | ORAL_TABLET | ORAL | Status: DC
  Filled 2022-06-03: qty 1

## 2022-06-03 NOTE — Plan of Care (Signed)
  Problem: Education: Goal: Knowledge of the prescribed therapeutic regimen will improve Outcome: Progressing   Problem: Activity: Goal: Ability to tolerate increased activity will improve Outcome: Progressing   Problem: Pain Management: Goal: Pain level will decrease with appropriate interventions Outcome: Progressing

## 2022-06-03 NOTE — Progress Notes (Signed)
Provided discharge education/instructions, all questions and concerns addressed. Pt not in any distress, discharged home with belongings accompanied by her husband.

## 2022-06-03 NOTE — Progress Notes (Signed)
Occupational Therapy Treatment Patient Details Name: Breanna Webster MRN: [id number] DOB: 12/21/1945 Today's Date: 06/03/2022   History of present illness 77 yo female s/p Left reverse shoulder replacement 06/01/22. Hx of CHF, Parkinson's syndrome, chrnic pain, OA, DM, TAVR, CVA, CHF.   OT comments  Therapist had patient's husband don and doff sling and shirt to demonstrate learning. He still needed min verbal cues. He was able to don the gait belt and walk patient to the bathroom and help her with toileting. Therapist had patient demonstrate exercises and husband assisted. The have an HEP handout. She is having a lot of pain in shoulder and bicep with any movement. The whole arm is tight. IN discussion about going home therapist is hesitant as patient's husband still needing verbal cues and patient herself at times exhibits poor retention such as asking therapist when we got back from the bathroom "Did I need to be wearing this?" In regards to the sling. With much conversation - and patient and spouse eager to go home. Therapist rewrote instructions in more simple terms and larger print and had patient's husband demonstrate understanding. Therapist had patient's spouse walk in the hallway as well. She requiring min guard and use of cane and has poor activity tolerance. Unsure of what her baseline is - they say somewhat similar. They report confidence in return home and report she will probably be similar for the next few days. Therapist recommends [place] and actually that family hire a PCA as well. She will be discharging with 24/7 assistance.    Recommendations for follow up therapy are one component of a multi-disciplinary discharge planning process, led by the attending physician.  Recommendations may be updated based on patient status, additional functional criteria and insurance authorization.    Follow Up Recommendations  Home health OT     Assistance Recommended at Discharge Frequent or constant  Supervision/Assistance  Patient can return home with the following  A little help with walking and/or transfers;A lot of help with bathing/dressing/bathroom;Assistance with cooking/housework;Help with stairs or ramp for entrance;Direct supervision/assist for financial management;Direct supervision/assist for medications management   Equipment Recommendations  None recommended by OT    Recommendations for Other Services      Precautions / Restrictions Precautions Precautions: Fall;Shoulder Type of Shoulder Precautions: Sling at all times except ADL/exercise Yes   Non weight bearing Yes   AROM elbow, wrist and hand to tolerance Yes   PROM of shoulder No   AROM of shoulder No. Okay for ROM in "beach ball area" per DR. Norris. Shoulder Interventions: Shoulder sling/immobilizer Required Braces or Orthoses: Sling Restrictions Weight Bearing Restrictions: Yes LUE Weight Bearing: Non weight bearing          Balance Overall balance assessment: Mild deficits observed, not formally tested         Standing balance support: Single extremity supported, Reliant on assistive device for balance Standing balance-Leahy Scale: Poor                             ADL either performed or assessed with clinical judgement   ADL                                         General ADL Comments: Had patient's husban remove sling and shirt and redonn. He continued to need some verbal cues on sequencing. Patient's  husband placed gait belt and assisted patient to bathroom and assited with toileting.    Extremity/Trunk Assessment              Vision       Perception     Praxis      Cognition Arousal/Alertness: Awake/alert Behavior During Therapy: WFL for tasks assessed/performed Overall Cognitive Status: Within Functional Limits for tasks assessed                                 General Comments: Demonstrating improvement in retention but still needs  increased time to remember and corrections.        Exercises Other Exercises Other Exercises: Went through elbow, wrist and hand exercises x 10 each. Patient only able to achieve partial supination due to pain and needing active assist for partial elbow flexion.    Shoulder Instructions       General Comments      Pertinent Vitals/ Pain       Pain Assessment Pain Assessment: 0-10 Pain Score: 5  Pain Location: L shoulder into bicep Pain Descriptors / Indicators: Spasm, Aching, Discomfort Pain Intervention(s): Limited activity within patient's tolerance, Monitored during session, Premedicated before session  Home Living                                          Prior Functioning/Environment              Frequency  Min 2X/week        Progress Toward Goals  OT Goals(current goals can now be found in the care plan section)  Progress towards OT goals: Progressing toward goals  Acute Rehab OT Goals Patient Stated Goal: to go home OT Goal Formulation: With patient Time For Goal Achievement: 06/16/22 Potential to Achieve Goals: Good  Plan Discharge plan remains appropriate    Co-evaluation                 AM-PAC OT "6 Clicks" Daily Activity     Outcome Measure   Help from another person eating meals?: A Little Help from another person taking care of personal grooming?: A Little Help from another person toileting, which includes using toliet, bedpan, or urinal?: A Lot Help from another person bathing (including washing, rinsing, drying)?: A Lot Help from another person to put on and taking off regular upper body clothing?: A Lot Help from another person to put on and taking off regular lower body clothing?: A Lot 6 Click Score: 14    End of Session Equipment Utilized During Treatment: Gait belt  OT Visit Diagnosis: Unsteadiness on feet (R26.81);Pain Pain - Right/Left: Left Pain - part of body: Shoulder   Activity Tolerance Patient  limited by pain   Patient Left with family/visitor present;in chair   Nurse Communication Mobility status        Time: 1326-1440 OT Time Calculation (min): 74 min  Charges: OT General Charges $OT Visit: 1 Visit OT Treatments $Self Care/Home Management : 68-82 mins  Gustavo Lah, OTR/L [place]  Office [phone]   Lenward Chancellor 06/03/2022, 3:05 PM

## 2022-06-03 NOTE — Progress Notes (Signed)
Patient ID: Breanna Webster, female   DOB: [birth year], 77 y.o.   MRN: [id number] Subjective: 2 Days Post-Op Procedure(s) (LRB): REVERSE SHOULDER ARTHROPLASTY (Left)    Patient reports pain as moderate. "Groggy" this morning Doesn't seem to have good grasp of plan Aldean Ast implied that she would go home after therapy however she doesn't recall this and has concerns about her pain and mobility  Objective:   VITALS:   Vitals:   06/03/22 0523 06/03/22 0547  BP: (!) 141/68   Pulse: 93 74  Resp: (!) 21 20  Temp: 98.2 F (36.8 C)   SpO2: (!) 81% 92%    Neurovascular intact Incision: dressing C/D/I - left shoulder  LABS Recent Labs    06/02/22 0335  HGB 8.8*  HCT 29.1*    Recent Labs    06/02/22 0335  NA 136  K 3.5  BUN 17  CREATININE 0.63  GLUCOSE 188*    No results for input(s): "LABPT", "INR" in the last 72 hours.   Assessment/Plan: 2 Days Post-Op Procedure(s) (LRB): REVERSE SHOULDER ARTHROPLASTY (Left)   Up with therapy Per Veverly Fells the plan was to be discharged home with family I would have her work with OT to determine safety and have discussion with family about discharge today versus tomorrow RTC to see Norris in 2 weeks

## 2022-06-03 NOTE — Progress Notes (Signed)
Occupational Therapy Treatment Patient Details Name: Breanna Webster MRN: [id number] DOB: 1946/02/29 Today's Date: 06/03/2022   History of present illness 77 yo female s/p Left reverse shoulder replacement 06/01/22. Hx of CHF, Parkinson's syndrome, chrnic pain, OA, DM, TAVR, CVA, CHF.   OT comments  Treatment went better today without the use of the ice cuff and cooler. Patient's spouse Breanna Webster purchased to XL gel cold packs to use instead.  Patient overall min guard to min assist for ambulation with cane. Therapist instructed patient's spouse to use a gait belt and stay close at home for ambulation for safety until she is more at her baseline. Patient stood at sink for oral care with therapist encouraging her to use left arm as an assist - which she did. She has not attempted to weight bear through arm which is good. Therapist had husband don and doff sling and don shirt and pants. He needed supervision with min verbal cues and encouragement but ultimately did better than yesterday. Went through exercises with patient. She is able to partially supinate - and needs active assist for elbow flexion due to pain. Her LUE is stiff - at times her entire arm stiffens up even in fingers and wrist when therapist initially attempting ROM. Unsure if underlying tone/rigidity/stiffness issues - she reports having a CVA on that side and Parkinsonian symptoms as well. Will follow up again this afternoon to reiterate all education.   Recommendations for follow up therapy are one component of a multi-disciplinary discharge planning process, led by the attending physician.  Recommendations may be updated based on patient status, additional functional criteria and insurance authorization.    Follow Up Recommendations  Home health OT     Assistance Recommended at Discharge Frequent or constant Supervision/Assistance  Patient can return home with the following  A little help with walking and/or transfers;A lot of help  with bathing/dressing/bathroom;Assistance with cooking/housework;Help with stairs or ramp for entrance;Direct supervision/assist for financial management;Direct supervision/assist for medications management   Equipment Recommendations  None recommended by OT    Recommendations for Other Services      Precautions / Restrictions Precautions Precautions: Fall;Shoulder Type of Shoulder Precautions: Sling at all times except ADL/exercise Yes   Non weight bearing Yes   AROM elbow, wrist and hand to tolerance Yes   PROM of shoulder No   AROM of shoulder No. Okay for ROM in "beach ball area" per DR. Norris. Shoulder Interventions: Shoulder sling/immobilizer Required Braces or Orthoses: Sling Restrictions Weight Bearing Restrictions: Yes LUE Weight Bearing: Non weight bearing       Mobility Bed Mobility                    Transfers                         Balance Overall balance assessment: Mild deficits observed, not formally tested                                         ADL either performed or assessed with clinical judgement   ADL Overall ADL's : Needs assistance/impaired     Grooming: Standing;Min guard;Oral care Grooming Details (indicate cue type and reason): Therapist had patient perform oral care standing at sink. Encouraged patientto use left hand as an assist.         Upper Body Dressing : Sitting;Maximal  assistance Upper Body Dressing Details (indicate cue type and reason): Therapist had patient's husband don and doff sling and assist with donning shirt. Still needing verbal cues but better today. Lower Body Dressing: Supervision/safety Lower Body Dressing Details (indicate cue type and reason): Therapist had husband assist patient wtih donning pants. Needed increased time Toilet Transfer: Minimal assistance;Ambulation;Regular Toilet   Toileting- Water quality scientist and Hygiene: Sit to/from stand;Sitting/lateral lean;Moderate  assistance Toileting - Clothing Manipulation Details (indicate cue type and reason): for clorthing management     Functional mobility during ADLs: Min guard;Minimal assistance;Cane General ADL Comments: Mostly min guard with cane to ambulate with one instance of min assist. Therapist encouraging husband to use gait belt with patient at home initially.    Extremity/Trunk Assessment              Vision Patient Visual Report: No change from baseline     Perception     Praxis      Cognition Arousal/Alertness: Awake/alert Behavior During Therapy: WFL for tasks assessed/performed Overall Cognitive Status: Within Functional Limits for tasks assessed                                 General Comments: Continues to have retention issues of education. Handouts are provided wtih written instructions.        Exercises Other Exercises Other Exercises: Went through elbow, wrist and hand exercises x 10 each. Patient only able to achieve partial supination due to pain and needing active assist for partial elbow flexion.    Shoulder Instructions       General Comments      Pertinent Vitals/ Pain       Pain Assessment Pain Assessment: 0-10 Pain Score: 5  Pain Location: L shoulder Pain Descriptors / Indicators: Spasm, Aching, Discomfort Pain Intervention(s): Limited activity within patient's tolerance, Premedicated before session  Home Living                                          Prior Functioning/Environment              Frequency  Min 2X/week        Progress Toward Goals  OT Goals(current goals can now be found in the care plan section)  Progress towards OT goals: Progressing toward goals  Acute Rehab OT Goals Patient Stated Goal: to go home safely OT Goal Formulation: With patient Time For Goal Achievement: 06/16/22 Potential to Achieve Goals: Good  Plan Discharge plan remains appropriate    Co-evaluation                  AM-PAC OT "6 Clicks" Daily Activity     Outcome Measure   Help from another person eating meals?: A Little Help from another person taking care of personal grooming?: A Little Help from another person toileting, which includes using toliet, bedpan, or urinal?: A Lot Help from another person bathing (including washing, rinsing, drying)?: A Lot Help from another person to put on and taking off regular upper body clothing?: A Lot Help from another person to put on and taking off regular lower body clothing?: A Lot 6 Click Score: 14    End of Session Equipment Utilized During Treatment: Gait belt  OT Visit Diagnosis: Unsteadiness on feet (R26.81);Pain Pain - Right/Left: Left Pain - part of body: Shoulder  Activity Tolerance Patient limited by pain   Patient Left with family/visitor present;in chair   Nurse Communication Mobility status        Time: [id number] OT Time Calculation (min): 56 min  Charges: OT General Charges $OT Visit: 1 Visit OT Treatments $Self Care/Home Management : 38-52 mins $Therapeutic Exercise: 8-22 mins  Breanna Webster, Breanna [facility]  Office [phone]   Breanna Webster 06/03/2022, 10:20 AM

## 2022-06-03 NOTE — TOC Transition Note (Signed)
Transition of Care [facility]) - CM/SW Discharge Note   Patient Details  Name: Breanna Webster MRN: [id number] Date of Birth: 07-06-1945  [facility]) CM/SW Contact:  Lennart Pall, LCSW Phone Number: 06/03/2022, 10:00 AM   Clinical Narrative:     Alerted by therapy that recommendation now for HHPT/OT follow up.  Met with pt and spouse and they are aware and agreeable and no HH agency preference - referral placed with [facility].  Pt has all needed DME.  No further TOC needs.  Final next level of care: [place] Barriers to Discharge: No Barriers Identified   Patient Goals and CMS Choice      Discharge Placement                         Discharge Plan and Services Additional resources added to the After Visit Summary for                  DME Arranged: N/A DME Agency: NA       HH Arranged: PT, OT HH Agency: [place] Date [place]: 06/03/22 Time HH Agency Contacted: 1000 Representative spoke with at [place]: [place] ([place]) Interventions SDOH Screenings   Food Insecurity: No Food Insecurity (06/01/2022)  Housing: Low Risk  (06/01/2022)  Transportation Needs: No Transportation Needs (06/01/2022)  Utilities: Not At Risk (06/01/2022)  Alcohol Screen: Low Risk  (03/14/2020)  Depression (PHQ2-9): Low Risk  (04/10/2022)  Financial Resource Strain: Medium Risk (11/13/2021)  Physical Activity: Inactive (04/03/2021)  Social Connections: Socially Integrated (04/03/2021)  Stress: No Stress Concern Present (04/03/2021)  Tobacco Use: Low Risk  (06/01/2022)     Readmission Risk Interventions     No data to display

## 2022-06-04 ENCOUNTER — Encounter (HOSPITAL_COMMUNITY): Payer: Self-pay | Admitting: Orthopedic Surgery

## 2022-06-04 ENCOUNTER — Other Ambulatory Visit (HOSPITAL_BASED_OUTPATIENT_CLINIC_OR_DEPARTMENT_OTHER): Payer: Self-pay

## 2022-06-05 ENCOUNTER — Encounter (HOSPITAL_COMMUNITY): Payer: Self-pay | Admitting: Orthopedic Surgery

## 2022-06-05 ENCOUNTER — Other Ambulatory Visit (HOSPITAL_BASED_OUTPATIENT_CLINIC_OR_DEPARTMENT_OTHER): Payer: Self-pay

## 2022-06-05 MED ORDER — ONETOUCH VERIO REFLECT W/DEVICE KIT
PACK | 0 refills | Status: DC
  Filled 2022-06-05 – 2022-06-07 (×2): qty 1, 30d supply, fill #0

## 2022-06-05 NOTE — Anesthesia Postprocedure Evaluation (Signed)
Anesthesia Post Note  Patient: Breanna Webster  Procedure(s) Performed: REVERSE SHOULDER ARTHROPLASTY (Left: Shoulder)     Patient location during evaluation: PACU Anesthesia Type: Regional and General Level of consciousness: awake and alert Pain management: pain level controlled Vital Signs Assessment: post-procedure vital signs reviewed and stable Respiratory status: spontaneous breathing, nonlabored ventilation, respiratory function stable and patient connected to nasal cannula oxygen Cardiovascular status: blood pressure returned to baseline and stable Postop Assessment: no apparent nausea or vomiting Anesthetic complications: no   No notable events documented.  Last Vitals:  Vitals:   06/03/22 0847 06/03/22 1553  BP: (!) 119/56 100/74  Pulse: 78 87  Resp: 17 18  Temp: 36.7 C 36.9 C  SpO2: 93% 95%    Last Pain:  Vitals:   06/03/22 1257  TempSrc:   PainSc: [place]

## 2022-06-07 ENCOUNTER — Telehealth: Payer: Self-pay | Admitting: Nurse Practitioner

## 2022-06-07 ENCOUNTER — Other Ambulatory Visit: Payer: Self-pay | Admitting: Physician Assistant

## 2022-06-07 ENCOUNTER — Other Ambulatory Visit (HOSPITAL_BASED_OUTPATIENT_CLINIC_OR_DEPARTMENT_OTHER): Payer: Self-pay

## 2022-06-07 MED ORDER — OMEPRAZOLE 20 MG PO CPDR: 20.0000 mg | DELAYED_RELEASE_CAPSULE | Freq: Every day | ORAL | 1 refills | Status: DC

## 2022-06-07 MED ORDER — OMEPRAZOLE 20 MG PO CPDR
20.0000 mg | DELAYED_RELEASE_CAPSULE | Freq: Every day | ORAL | 1 refills | Status: DC
  Filled 2022-06-07: qty 90, 90d supply, fill #0
  Filled 2022-09-11: qty 90, 90d supply, fill #1

## 2022-06-07 NOTE — Telephone Encounter (Signed)
Refilled Omeprazole and sent to [facility]

## 2022-06-07 NOTE — Telephone Encounter (Signed)
Inbound call from pharmacy stating patient needs medication refill for omeprazole. Please advise.  Thank you

## 2022-06-08 ENCOUNTER — Other Ambulatory Visit (HOSPITAL_BASED_OUTPATIENT_CLINIC_OR_DEPARTMENT_OTHER): Payer: Self-pay

## 2022-06-10 DIAGNOSIS — R911 Solitary pulmonary nodule: Secondary | ICD-10-CM | POA: Diagnosis not present

## 2022-06-10 DIAGNOSIS — Z952 Presence of prosthetic heart valve: Secondary | ICD-10-CM | POA: Diagnosis not present

## 2022-06-10 DIAGNOSIS — Z86010 Personal history of colonic polyps: Secondary | ICD-10-CM | POA: Diagnosis not present

## 2022-06-10 DIAGNOSIS — Z471 Aftercare following joint replacement surgery: Secondary | ICD-10-CM | POA: Diagnosis not present

## 2022-06-10 DIAGNOSIS — Z8719 Personal history of other diseases of the digestive system: Secondary | ICD-10-CM | POA: Diagnosis not present

## 2022-06-10 DIAGNOSIS — Z96612 Presence of left artificial shoulder joint: Secondary | ICD-10-CM | POA: Diagnosis not present

## 2022-06-10 DIAGNOSIS — K219 Gastro-esophageal reflux disease without esophagitis: Secondary | ICD-10-CM | POA: Diagnosis not present

## 2022-06-10 DIAGNOSIS — D563 Thalassemia minor: Secondary | ICD-10-CM | POA: Diagnosis not present

## 2022-06-10 DIAGNOSIS — E039 Hypothyroidism, unspecified: Secondary | ICD-10-CM | POA: Diagnosis not present

## 2022-06-10 DIAGNOSIS — G20A1 Parkinson's disease without dyskinesia, without mention of fluctuations: Secondary | ICD-10-CM | POA: Diagnosis not present

## 2022-06-10 DIAGNOSIS — Z955 Presence of coronary angioplasty implant and graft: Secondary | ICD-10-CM | POA: Diagnosis not present

## 2022-06-10 DIAGNOSIS — K7460 Unspecified cirrhosis of liver: Secondary | ICD-10-CM | POA: Diagnosis not present

## 2022-06-10 DIAGNOSIS — Z8673 Personal history of transient ischemic attack (TIA), and cerebral infarction without residual deficits: Secondary | ICD-10-CM | POA: Diagnosis not present

## 2022-06-10 DIAGNOSIS — R011 Cardiac murmur, unspecified: Secondary | ICD-10-CM | POA: Diagnosis not present

## 2022-06-10 DIAGNOSIS — M545 Low back pain, unspecified: Secondary | ICD-10-CM | POA: Diagnosis not present

## 2022-06-10 DIAGNOSIS — I2510 Atherosclerotic heart disease of native coronary artery without angina pectoris: Secondary | ICD-10-CM | POA: Diagnosis not present

## 2022-06-10 DIAGNOSIS — I110 Hypertensive heart disease with heart failure: Secondary | ICD-10-CM | POA: Diagnosis not present

## 2022-06-10 DIAGNOSIS — E119 Type 2 diabetes mellitus without complications: Secondary | ICD-10-CM | POA: Diagnosis not present

## 2022-06-10 DIAGNOSIS — K5790 Diverticulosis of intestine, part unspecified, without perforation or abscess without bleeding: Secondary | ICD-10-CM | POA: Diagnosis not present

## 2022-06-10 DIAGNOSIS — I509 Heart failure, unspecified: Secondary | ICD-10-CM | POA: Diagnosis not present

## 2022-06-10 DIAGNOSIS — M1990 Unspecified osteoarthritis, unspecified site: Secondary | ICD-10-CM | POA: Diagnosis not present

## 2022-06-10 DIAGNOSIS — K589 Irritable bowel syndrome without diarrhea: Secondary | ICD-10-CM | POA: Diagnosis not present

## 2022-06-10 DIAGNOSIS — F419 Anxiety disorder, unspecified: Secondary | ICD-10-CM | POA: Diagnosis not present

## 2022-06-10 DIAGNOSIS — G8929 Other chronic pain: Secondary | ICD-10-CM | POA: Diagnosis not present

## 2022-06-10 DIAGNOSIS — M8580 Other specified disorders of bone density and structure, unspecified site: Secondary | ICD-10-CM | POA: Diagnosis not present

## 2022-06-12 DIAGNOSIS — Z471 Aftercare following joint replacement surgery: Secondary | ICD-10-CM | POA: Diagnosis not present

## 2022-06-12 DIAGNOSIS — I110 Hypertensive heart disease with heart failure: Secondary | ICD-10-CM | POA: Diagnosis not present

## 2022-06-12 DIAGNOSIS — G20A1 Parkinson's disease without dyskinesia, without mention of fluctuations: Secondary | ICD-10-CM | POA: Diagnosis not present

## 2022-06-12 DIAGNOSIS — I509 Heart failure, unspecified: Secondary | ICD-10-CM | POA: Diagnosis not present

## 2022-06-12 DIAGNOSIS — R011 Cardiac murmur, unspecified: Secondary | ICD-10-CM | POA: Diagnosis not present

## 2022-06-12 DIAGNOSIS — I2510 Atherosclerotic heart disease of native coronary artery without angina pectoris: Secondary | ICD-10-CM | POA: Diagnosis not present

## 2022-06-14 ENCOUNTER — Other Ambulatory Visit (HOSPITAL_BASED_OUTPATIENT_CLINIC_OR_DEPARTMENT_OTHER): Payer: Self-pay

## 2022-06-14 DIAGNOSIS — Z4789 Encounter for other orthopedic aftercare: Secondary | ICD-10-CM | POA: Diagnosis not present

## 2022-06-15 ENCOUNTER — Other Ambulatory Visit (HOSPITAL_BASED_OUTPATIENT_CLINIC_OR_DEPARTMENT_OTHER): Payer: Self-pay

## 2022-06-15 ENCOUNTER — Other Ambulatory Visit: Payer: Self-pay | Admitting: Adult Health

## 2022-06-15 DIAGNOSIS — Z471 Aftercare following joint replacement surgery: Secondary | ICD-10-CM | POA: Diagnosis not present

## 2022-06-15 DIAGNOSIS — F419 Anxiety disorder, unspecified: Secondary | ICD-10-CM

## 2022-06-15 DIAGNOSIS — I110 Hypertensive heart disease with heart failure: Secondary | ICD-10-CM | POA: Diagnosis not present

## 2022-06-15 DIAGNOSIS — I509 Heart failure, unspecified: Secondary | ICD-10-CM | POA: Diagnosis not present

## 2022-06-15 DIAGNOSIS — I2510 Atherosclerotic heart disease of native coronary artery without angina pectoris: Secondary | ICD-10-CM | POA: Diagnosis not present

## 2022-06-15 DIAGNOSIS — R011 Cardiac murmur, unspecified: Secondary | ICD-10-CM | POA: Diagnosis not present

## 2022-06-15 DIAGNOSIS — G20A1 Parkinson's disease without dyskinesia, without mention of fluctuations: Secondary | ICD-10-CM | POA: Diagnosis not present

## 2022-06-15 MED ORDER — ESCITALOPRAM OXALATE 20 MG PO TABS
ORAL_TABLET | ORAL | 1 refills | Status: DC
  Filled 2022-06-25: qty 90, 90d supply, fill #0
  Filled 2022-10-09: qty 90, 90d supply, fill #1

## 2022-06-18 ENCOUNTER — Telehealth: Payer: Self-pay | Admitting: Internal Medicine

## 2022-06-18 DIAGNOSIS — I2510 Atherosclerotic heart disease of native coronary artery without angina pectoris: Secondary | ICD-10-CM | POA: Diagnosis not present

## 2022-06-18 DIAGNOSIS — R011 Cardiac murmur, unspecified: Secondary | ICD-10-CM | POA: Diagnosis not present

## 2022-06-18 DIAGNOSIS — G20A1 Parkinson's disease without dyskinesia, without mention of fluctuations: Secondary | ICD-10-CM | POA: Diagnosis not present

## 2022-06-18 DIAGNOSIS — I110 Hypertensive heart disease with heart failure: Secondary | ICD-10-CM | POA: Diagnosis not present

## 2022-06-18 DIAGNOSIS — I509 Heart failure, unspecified: Secondary | ICD-10-CM | POA: Diagnosis not present

## 2022-06-18 DIAGNOSIS — Z471 Aftercare following joint replacement surgery: Secondary | ICD-10-CM | POA: Diagnosis not present

## 2022-06-18 NOTE — Telephone Encounter (Signed)
Spoke with Breanna Webster who states that the patient has 2+ edema in her lower extremities. She has gained about 3 lbs in a week. She is taking lasix 20 mg daily. Denies any change in diet with increased sodium. She is elevating her legs when she is in her recliner about half of they day. She denies any shortness of breath. Blood pressure has been stable. Previous patient of Dr. Thompson Caul who is scheduled to now follow up with Dr. Ervin Knack.

## 2022-06-18 NOTE — Telephone Encounter (Signed)
Clair Gulling, physical therapist with [facility] called to say patient has  bilateral edema up to mid-shin. Since last week she is up 3lbs, lungs are clear.  Her blood pressure in 122/80.

## 2022-06-19 ENCOUNTER — Other Ambulatory Visit (HOSPITAL_BASED_OUTPATIENT_CLINIC_OR_DEPARTMENT_OTHER): Payer: Self-pay

## 2022-06-20 ENCOUNTER — Telehealth: Payer: Self-pay | Admitting: Cardiology

## 2022-06-20 DIAGNOSIS — I509 Heart failure, unspecified: Secondary | ICD-10-CM | POA: Diagnosis not present

## 2022-06-20 DIAGNOSIS — I110 Hypertensive heart disease with heart failure: Secondary | ICD-10-CM | POA: Diagnosis not present

## 2022-06-20 DIAGNOSIS — I2510 Atherosclerotic heart disease of native coronary artery without angina pectoris: Secondary | ICD-10-CM | POA: Diagnosis not present

## 2022-06-20 DIAGNOSIS — R011 Cardiac murmur, unspecified: Secondary | ICD-10-CM | POA: Diagnosis not present

## 2022-06-20 DIAGNOSIS — G20A1 Parkinson's disease without dyskinesia, without mention of fluctuations: Secondary | ICD-10-CM | POA: Diagnosis not present

## 2022-06-20 DIAGNOSIS — Z471 Aftercare following joint replacement surgery: Secondary | ICD-10-CM | POA: Diagnosis not present

## 2022-06-20 NOTE — Telephone Encounter (Signed)
Left a message for Breanna Webster to call back.   Called pt who is aware of medication changes.  Will double up furosemide and potassium for 5 days.  Advised pt to wt daily keep a record to show Dr. Johney Frame at 06/26/22 OV.  Also, advised to monitor salt intake and wear compression socks if able to during the day take off at night.  Pt reports elevates legs during the day. Pt started increased dose of furosemide today.  Advised to call back with any further questions or concerns.

## 2022-06-20 NOTE — Telephone Encounter (Signed)
Spoke with the patient and advised on recommendations from Dr. Gasper Sells. Patient will increase Lasix for 5 days. I have made her a sooner appointment with Dr. Johney Frame.

## 2022-06-20 NOTE — Telephone Encounter (Signed)
Calling to confirm if patient is suppose to double up on her lasix and potassium. Please advise

## 2022-06-21 DIAGNOSIS — I509 Heart failure, unspecified: Secondary | ICD-10-CM | POA: Diagnosis not present

## 2022-06-21 DIAGNOSIS — R011 Cardiac murmur, unspecified: Secondary | ICD-10-CM | POA: Diagnosis not present

## 2022-06-21 DIAGNOSIS — Z471 Aftercare following joint replacement surgery: Secondary | ICD-10-CM | POA: Diagnosis not present

## 2022-06-21 DIAGNOSIS — G20A1 Parkinson's disease without dyskinesia, without mention of fluctuations: Secondary | ICD-10-CM | POA: Diagnosis not present

## 2022-06-21 DIAGNOSIS — I2510 Atherosclerotic heart disease of native coronary artery without angina pectoris: Secondary | ICD-10-CM | POA: Diagnosis not present

## 2022-06-21 DIAGNOSIS — I110 Hypertensive heart disease with heart failure: Secondary | ICD-10-CM | POA: Diagnosis not present

## 2022-06-21 NOTE — Telephone Encounter (Signed)
Left a message advising Clair Gulling that pt is knowledgeable of medication changes.  Asked that Clair Gulling call back if has further questions or concerns.

## 2022-06-21 NOTE — Telephone Encounter (Signed)
Caller returned RN's call and stated can leave a detailed VM, phone is confidential.

## 2022-06-21 NOTE — Telephone Encounter (Signed)
Breanna Webster is calling back. And he states that he to be called to say the actual dosage and if dont answer please leave it on VM. Please advise

## 2022-06-22 DIAGNOSIS — Z471 Aftercare following joint replacement surgery: Secondary | ICD-10-CM | POA: Diagnosis not present

## 2022-06-22 DIAGNOSIS — I509 Heart failure, unspecified: Secondary | ICD-10-CM | POA: Diagnosis not present

## 2022-06-22 DIAGNOSIS — R011 Cardiac murmur, unspecified: Secondary | ICD-10-CM | POA: Diagnosis not present

## 2022-06-22 DIAGNOSIS — G20A1 Parkinson's disease without dyskinesia, without mention of fluctuations: Secondary | ICD-10-CM | POA: Diagnosis not present

## 2022-06-22 DIAGNOSIS — I2510 Atherosclerotic heart disease of native coronary artery without angina pectoris: Secondary | ICD-10-CM | POA: Diagnosis not present

## 2022-06-22 DIAGNOSIS — I110 Hypertensive heart disease with heart failure: Secondary | ICD-10-CM | POA: Diagnosis not present

## 2022-06-22 NOTE — Telephone Encounter (Signed)
Lilia Argue who reports Manager with [facility] confirmed medication changes for pt.  He reports pt does not wear compression socks.  Pt recently had right shoulder surgery.  Unsure if pt will be able to put socks on at this time.  Clair Gulling had no further concerns at this time.

## 2022-06-23 NOTE — Progress Notes (Unsigned)
Cardiology Office Note:    Date:  06/26/2022   ID:  AMARIELLE LOVEN, DOB 1945/11/06, MRN [id number]  PCP:  [name], NP   [place] Prov[facility] Cardiologist:  Sinclair Grooms, MD (Inactive)   Referring MD: [name], NP    History of Present Illness:    AYRIEL KOVALSKY is a 77 y.o. female with a hx of severe AS s/p TAVR in 2020, CAD s/p PCI to [facility] in 2019, MV endocarditis in 09/2018, paroxysmal Afib, embolic cerebellar CVA, DMII, Graves disease, NASH cirrhosis, HTN, chronic diastolic HF, and pulmonary HTN who was previously followed by Dr. Tamala Julian who now returns to clinic for follow-up.  Was last seen in clinic on 03/2022 where she was having significant arthritic pain. Also with persistent SOB that was attributed to her diastolic dysfunction. Last TTE 02/2021 with LVEF 60-65%, mild LVH, G1DD, normal RV, moderate LAE, mild MS, well functioning AoV prosthesis with a mean gradient 68mHg.  Today, the patient states that she is not feeling well. She has been generally declining over the past 4 years after her stroke. Her biggest concern is she has been having significant LE edema since she was discharged room the hospital after her left shoulder surgery in 05/2022. She has also noted more SOB with exertion and when laying flat. She has increased her lasix but with no significant improvement. We spent a significant time going over her medications and treatments at length.   Otherwise, she continues to have significant arthritic pain. Rare episodes of chest pain. No significant palpitations. Has been taking apixaban consistently. Was only off the medication for 3 days prior to her surgery.   Past Medical History:  Diagnosis Date   Anxiety    Arthritis    "back" (04/22/2018)   Back pain    Blood transfusion without reported diagnosis    CAD (coronary artery disease)    a. 03/2018 s/p PCI/DES to the RCA (3.0x15 Onyx DES).   Carotid artery stenosis    Mild   Chest  pain    CHF (congestive heart failure) (HCC)    Chronic lower back pain    Cirrhosis (HCC)    Colon polyps    Diverticulitis    Diverticulosis    Esophageal thickening    seen on pre TAVR CT scan, also questionable cirrhosis. MRI recommended. Will refer to GI   GERD (gastroesophageal reflux disease)    Grave's disease    Heart murmur    History of colonic polyps 05/22/2017   History of hiatal hernia    Hyperlipidemia    Hypertension    Hypothyroidism    IBS (irritable bowel syndrome)    Osteopenia    Parkinson's syndrome 04/23/2020   Pulmonary nodules    seen on pre TAVR CT. likley benign. no follow up recommended if pt low risk.   S/P TAVR (transcatheter aortic valve replacement)    Shortness of breath on exertion    [place]   x2   Thalassemia minor    Thyroid disease    Type II diabetes mellitus ([place]     Past Surgical History:  Procedure Laterality Date   [place] 03/03/2018   Procedure: 24 HOUR PH STUDY;  Surgeon: NMauri Pole MD;  Location: [facility];  Service: Endoscopy;  Laterality: N/A;   COLONOSCOPY  2023   COLONOSCOPY W/ BIOPSIES AND POLYPECTOMY     CORONARY ANGIOGRAPHY Right 04/21/2018   Procedure: CORONARY ANGIOGRAPHY (CATH LAB);  Surgeon: [name], MD;  Location: [place] CV LAB;  Service: Cardiovascular;  Laterality: Right;   CORONARY STENT INTERVENTION N/A 04/22/2018   Procedure: CORONARY STENT INTERVENTION;  Surgeon: [name], MD;  Location: [place] CV LAB;  Service: Cardiovascular;  Laterality: N/A;   DILATION AND CURETTAGE OF UTERUS     ESOPHAGEAL MANOMETRY N/A 03/03/2018   Procedure: ESOPHAGEAL MANOMETRY (EM);  Surgeon: Mauri Pole, MD;  Location: [facility];  Service: Endoscopy;  Laterality: N/A;   HYSTEROSCOPY     fibroids   LAPAROSCOPIC CHOLECYSTECTOMY  1985   LAPAROSCOPY     fibroids   NISSEN FUNDOPLICATION  [id number]   POLYPECTOMY     REVERSE SHOULDER ARTHROPLASTY Left 06/01/2022    Procedure: REVERSE SHOULDER ARTHROPLASTY;  Surgeon: Netta Cedars, MD;  Location: [facility];  Service: Orthopedics;  Laterality: Left;  choice with interscalene block   RIGHT/LEFT HEART CATH AND CORONARY ANGIOGRAPHY N/A 02/20/2018   Procedure: RIGHT/LEFT HEART CATH AND CORONARY ANGIOGRAPHY;  Surgeon: [name], MD;  Location: [place] [facility];  Service: Cardiovascular;  Laterality: N/A;   TEE WITHOUT CARDIOVERSION N/A 07/08/2018   Procedure: TRANSESOPHAGEAL ECHOCARDIOGRAM (TEE);  Surgeon: Burnell Blanks, MD;  Location: [place];  Service: Open Heart Surgery;  Laterality: N/A;   TEE WITHOUT CARDIOVERSION  10/07/2018   TEE WITHOUT CARDIOVERSION N/A 10/07/2018   Procedure: TRANSESOPHAGEAL ECHOCARDIOGRAM (TEE);  Surgeon: Jerline Pain, MD;  Location: [facility] ENDOSCOPY;  Service: Cardiovascular;  Laterality: N/A;   TONSILLECTOMY     age 11   TRANSCATHETER AORTIC VALVE REPLACEMENT, TRANSFEMORAL N/A 07/08/2018   Procedure: TRANSCATHETER AORTIC VALVE REPLACEMENT, TRANSFEMORAL;  Surgeon: Burnell Blanks, MD;  Location: [place];  Service: Open Heart Surgery;  Laterality: N/A;    Current Medications: Current Meds  Medication Sig   acetaminophen (TYLENOL) 500 MG tablet Take 500 mg by mouth every 6 (six) hours as needed for mild pain.   apixaban (ELIQUIS) 5 MG TABS tablet Take 1 tablet (5 mg total) by mouth 2 (two) times daily.   b complex vitamins capsule Take 1 capsule by mouth daily.   BD PEN NEEDLE NANO 2ND GEN 32G X 4 MM MISC    Blood Glucose Monitoring Suppl (ONETOUCH VERIO REFLECT) w/Device KIT Use as directed three times daily   cholecalciferol (VITAMIN D) 25 MCG (1000 UNIT) tablet Take 1,000 Units by mouth daily.   Continuous Blood Gluc Receiver (FREESTYLE LIBRE READER) DEVI Use as directed   Continuous Blood Gluc Sensor (FREESTYLE LIBRE 3 SENSOR) MISC Place one sensor every 14 days   diltiazem (CARDIZEM CD) 120 MG 24 hr capsule Take 1 capsule (120 mg total) by mouth daily.    escitalopram (LEXAPRO) 20 MG tablet TAKE ONE TABLET BY MOUTH DAILY   Evolocumab (REPATHA SURECLICK) [id number] MG/ML SOAJ Inject 1 dose into the skin every 14 (fourteen) days.   glucose blood (ONETOUCH VERIO) test strip USE TO CHECK BLOOD SUGAR THREE TIMES DAILY   influenza vaccine adjuvanted (FLUAD) 0.5 ML injection Inject into the muscle.   insulin NPH Human (HUMULIN N) 100 UNIT/ML injection Inject 20 units under the skin in the morning and 14 units in evening   Insulin NPH, Human,, Isophane, (HUMULIN N KWIKPEN) 100 UNIT/ML Kiwkpen Inject 20 units into the skin every morning and 14 units every evening. (Patient taking differently: Inject 14-20 Units into the skin 2 (two) times daily.)   Insulin Pen Needle (UNIFINE PENTIPS) 32G X 4 MM MISC Inject 4 times subcutaneously  insulin regular (HUMULIN R) 100 units/mL injection Inject 10 Units total into the skin 3 (three) times daily before meals.   Insulin Regular Human (NOVOLIN R FLEXPEN RELION) 100 UNIT/ML KwikPen Inject 5 to 10 units under the skin three times daily.   Insulin Syringe-Needle U-100 (ULTICARE INSULIN SYRINGE) 31G X 5/16" 1 ML MISC Inject 4 times daily subcutaneously   Lancets (ONETOUCH DELICA PLUS 123XX123) MISC Use twice daily   levothyroxine (SYNTHROID) 137 MCG tablet Take 1 tablet by mouth every morning on an empty stomach  BRAND NAME ONLY   losartan (COZAAR) 50 MG tablet TAKE ONE TABLET BY MOUTH DAILY (Patient taking differently: Take 50 mg by mouth daily.)   metFORMIN (GLUCOPHAGE-XR) 500 MG 24 hr tablet Take 1 tablet by mouth 2 times daily   Multiple Vitamin (MULITIVITAMIN WITH MINERALS) TABS Take 1 tablet by mouth daily.   nitroGLYCERIN (NITROSTAT) 0.4 MG SL tablet Place 1 tablet (0.4 mg total) under the tongue every 5 (five) minutes as needed for chest pain.   omeprazole (PRILOSEC) 20 MG capsule Take 1 capsule (20 mg total) by mouth daily.   omeprazole (PRILOSEC) 20 MG capsule Take 1 capsule (20 mg total) by mouth daily.   potassium  chloride SA (KLOR-CON M) 20 MEQ tablet Take 1 tablet (20 mEq total) by mouth twice daily for 3 days only, then decrease to taking 1 tablet (20 mEq total) by mouth daily thereafter.  Take in concurrence with torsemide.   torsemide (DEMADEX) 20 MG tablet Take 1 tablet (20 mg total) by mouth twice daily for 3 days only, then decrease to taking 1 tablet (20 mg total) by mouth daily thereafter.   traMADol (ULTRAM) 50 MG tablet Take 1 tablet (50 mg total) by mouth every 6 (six) hours as needed for moderate pain or severe pain.   Vibegron (GEMTESA) 75 MG TABS Take 1 tablet by mouth daily. (Patient taking differently: Take 75 mg by mouth at bedtime.)   [DISCONTINUED] furosemide (LASIX) 20 MG tablet Take 2 tablets ('40mg'$ ) by mouth daily on 9/13, 9/14, and 9/15. Then take 1 tablet ('20mg'$ ) by mouth daily. (Patient taking differently: Take 20 mg by mouth daily. take 1 tablet ('20mg'$ ) by mouth daily.)   [DISCONTINUED] meclizine (ANTIVERT) 25 MG tablet Take 1 tablet (25 mg total) by mouth 3 (three) times daily as needed for dizziness.   [DISCONTINUED] methocarbamol (ROBAXIN) 500 MG tablet Take 1 tablet (500 mg total) by mouth 4 (four) times daily.   [DISCONTINUED] methocarbamol (ROBAXIN) 500 MG tablet Take 1 tablet (500 mg total) by mouth every 8 (eight) hours as needed for muscle spasms.   [DISCONTINUED] ondansetron (ZOFRAN) 4 MG tablet Take 1 tablet (4 mg total) by mouth every 8 (eight) hours as needed for nausea or vomiting.   [DISCONTINUED] potassium chloride SA (KLOR-CON M) 20 MEQ tablet Take 1 tablet (20 mEq total) by mouth daily as needed. Take with Furosemide (Lasix).   [DISCONTINUED] pramipexole (MIRAPEX) 0.125 MG tablet Take 1 tablet (0.125 mg total) by mouth 3 (three) times daily.     Allergies:   Patient has no known allergies.   Social History   Socioeconomic History   Marital status: Married    Spouse name: Not on file   Number of children: 2   Years of education: Not on file   Highest education  level: Not on file  Occupational History   Occupation: Retired-Director of the [employer]  Tobacco Use   Smoking status: Never    Passive exposure:  Never   Smokeless tobacco: Never  Vaping Use   Vaping Use: Never used  Substance and Sexual Activity   Alcohol use: No    Alcohol/week: 0.0 standard drinks of alcohol   Drug use: Never   Sexual activity: Not Currently  Other Topics Concern   Not on file  Social History Narrative   Lives with husband in a one story home.     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   Education: college.   Social Determinants of Health   Financial Resource Strain: Medium Risk (11/13/2021)   Overall Financial Resource Strain (CARDIA)    Difficulty of Paying Living Expenses: Somewhat hard  Food Insecurity: No Food Insecurity (06/01/2022)   Hunger Vital Sign    Worried About Running Out of Food in the Last Year: Never true    Ran Out of Food in the Last Year: Never true  Transportation Needs: No Transportation Needs (06/01/2022)   PRAPARE - Hydrologist (Medical): No    Lack of Transportation (Non-Medical): No  Physical Activity: Inactive (04/03/2021)   Exercise Vital Sign    Days of Exercise per Week: 0 days    Minutes of Exercise per Session: 0 min  Stress: No Stress Concern Present (04/03/2021)   [place]    Feeling of Stress : Not at all  Social Connections: [place] (04/03/2021)   Social Connection and Isolation Panel [NHANES]    Frequency of Communication with Friends and Family: More than three times a week    Frequency of Social Gatherings with Friends and Family: More than three times a week    Attends Religious Services: More than 4 times per year    Active Member of [employer] or Organizations: Yes    Attends Music therapist: More than 4 times per  year    Marital Status: Married     Family History: The patient's family history includes Headache in an other family member; Healthy in her son; Heart failure in her mother. There is no history of Colon cancer, Pancreatic cancer, Rectal cancer, or Stomach cancer. She was adopted.  ROS:   Please see the history of present illness.     All other systems reviewed and are negative.  EKGs/Labs/Other Studies Reviewed:    The following studies were reviewed today: Carotid US 07/19/2021 Bilateral ICA 1-39; atypical antegrade flow in left vertebral artery   ECHO COMPLETE WO IMAGING ENHANCING AGENT 03/21/2021 EF 60-65, no RWMA, mild LVH, G1 DD, GLS -19.9, normal RVSF, mild mitral stenosis (mean gradient 4.5 mmHg), s/p TAVR with normal structure and function (mean gradient 15, V-max 243 cm/s), RAP 3   CARDIAC TELEMETRY MONITORING-INTERPRETATION ONLY 12/09/2018  NSR  Non sustained WCT, 14 beats, likely SVT  Occasional PVC's  No atrial fibrillation    Cardiac catheterization 04/22/2018 70 to 80% mid RCA Widely patent LAD, left main, and circumflex. Normal LV systolic function.  LVEDP normal at 15 mmHg.  Estimated EF 55%. Moderately severe pulmonary hypertension with PA systolic pressure 66 mmHg and PA mean pressure 41 mmHg. Pulmonary capillary wedge mean pressure 18 mmHg Right atrial mean pressure 11 mmHg Mild to moderate aortic stenosis with calculated aortic valve area 1.21 cm based upon a peak to peak gradient of 21 mmHg and mean gradient of 18 mmHg.  This would suggest that the patient's aortic valve is probably not  playing a major role in production of pulmonary hypertension or dyspnea.   RECOMMENDATIONS:   Exclude recurrent pulmonary emboli.  Exclude other causes of pulmonary hypertension such as connective tissue disease. Consider obstructive sleep apnea. Eventual PCI with stenting of the right coronary.  This should probably be done after the patient has had what ever pulmonary  and GI procedures.  Stenting would prevent discontinuation of dual antiplatelet therapy for at least 6 months and possibly a year. Plan to speak with providers involved and make a decision about when to proceed with PCI. Aortic valve is not severely involved enough to recommend therapy at this time.  EKG:  EKG is not ordered today.    Recent Labs: 12/19/2021: NT-Pro BNP 612 05/16/2022: ALT 28; Platelets 158 06/02/2022: BUN 17; Creatinine, Ser 0.63; Hemoglobin 8.8; Potassium 3.5; Sodium 136  Recent Lipid Panel    Component Value Date/Time   CHOL 106 09/06/2021 1018   CHOL 156 11/21/2016 1113   TRIG 101 09/06/2021 1018   HDL 50 09/06/2021 1018   HDL 44 11/21/2016 1113   CHOLHDL 2.1 09/06/2021 1018   VLDL 20 09/06/2021 1018   LDLCALC 36 09/06/2021 1018   LDLCALC 91 11/21/2016 1113   LDLDIRECT 98.0 05/24/2014 1343     Risk Assessment/Calculations:    CHA2DS2-VASc Score = 9   This indicates a 12.2% annual risk of stroke. The patient's score is based upon: CHF History: 1 HTN History: 1 Diabetes History: 1 Stroke History: 2 Vascular Disease History: 1 Age Score: 2 Gender Score: 1             Physical Exam:    VS:  BP 126/62   Pulse 84   Ht '5\' 5"'$  (1.651 m)   Wt 172 lb (78 kg)   SpO2 94%   BMI 28.62 kg/m     Wt Readings from Last 3 Encounters:  06/26/22 172 lb (78 kg)  06/01/22 172 lb (78 kg)  05/16/22 172 lb (78 kg)     GEN:  Well nourished, well developed in no acute distress HEENT: Normal NECK: JVD to the angle of the mandible CARDIAC: RRR, 2/6 systolic murmur RESPIRATORY:  Crackles at the bases ABDOMEN: Soft, non-tender, non-distended MUSCULOSKELETAL:  2+ LE edema on the left, 1+ on the right. Warm  SKIN: Warm and dry NEUROLOGIC:  Alert and oriented x 3 PSYCHIATRIC:  Normal affect   ASSESSMENT:    1. S/P TAVR (transcatheter aortic valve replacement)   2. Chronic heart failure with preserved ejection fraction ([place])   3. Type 2 diabetes mellitus with  complication, with long-term current use of insulin ([place])   4. Acute on chronic diastolic CHF (congestive heart failure) ([place])   5. Swelling of left lower extremity   6. Medication management   7. Cerebellar stroke, acute ([place])   8. Hypertension, unspecified type   9. Severe aortic stenosis   10. Coronary artery disease involving native coronary artery of native heart without angina pectoris   11. Paroxysmal atrial fibrillation (HCC)   12. Secondary hypercoagulable state ([place])    PLAN:    In order of problems listed above:  #Acute on Chronic Diastolic HF: Patient presents with worsening LE edema, orthopnea and DOE after recent shoulder surgery. Suspect this is related to fluid administration during her procedure and post-op. Her lasix was increased but symptoms have persisted. Long discussion held today about treatment and reassured her that we will work to improve her symptoms. Will first work on diuresis and then add  GDMT as able. -Change lasix to torsemide '20mg'$  BID for 3days and then '20mg'$  daily thereafter -Increase potassium to 8mq BID x3 days and then 223m daily thereafter -Check TTE given acute change in symptoms -Could not afford jardiance/farxiga -Will add spironolactone as able at next visit -Check BNP and BMET today and next week after diuresis -Given that there is L>R LE edema on exam and recent orthopedic surgery, will check LE doppler for DVT  #CAD s/p PCI to RCA: Last cath 2019 with PCI to RCA. Currently doing well without anginal symptoms. -Not on ASA due [facility]  #Severe AS s/p TAVR: Last TTE 02/2021 with well functioning prosthesis with mean gradient 1552m. -Repeat TTE for monitoring  #Paroxysmal Afib: CHADs-vasc 9. Currently on apixaban fro AC. -Continue apixaban '5mg'$  BID -Continue dilt '120mg'$  daily  #HTN: Well controlled today. -Continue losartan '50mg'$  daily  #HLD: -Continue repatha '140mg'$  q2weeks  #History of CVA: On repatha  and apxiaban             Medication Adjustments/Labs and Tests Ordered: Current medicines are reviewed at length with the patient today.  Concerns regarding medicines are outlined above.  Orders Placed This Encounter  Procedures   Basic metabolic panel   Pro b natriuretic peptide   Basic metabolic panel   Pro b natriuretic peptide   ECHOCARDIOGRAM COMPLETE   VAS US [place] EXTREMITY VENOUS (DVT)   Meds ordered this encounter  Medications   torsemide (DEMADEX) 20 MG tablet    Sig: Take 1 tablet (20 mg total) by mouth twice daily for 3 days only, then decrease to taking 1 tablet (20 mg total) by mouth daily thereafter.    Dispense:  40 tablet    Refill:  0   potassium chloride SA (KLOR-CON M) 20 MEQ tablet    Sig: Take 1 tablet (20 mEq total) by mouth twice daily for 3 days only, then decrease to taking 1 tablet (20 mEq total) by mouth daily thereafter.  Take in concurrence with torsemide.    Dispense:  40 tablet    Refill:  0    Dose increase- Please instruct patient to take in concurrence with torsemide.    Patient Instructions  Medication Instructions:   STOP TAKING FUROSEMIDE (LASIX) NOW  START TAKING TORSEMIDE 20 MG BY MOUTH TWICE DAILY FOR 3 DAYS ONLY, THEN DECREASE TO TAKING 20 MG BY MOUTH DAILY THEREAFTER  INCREASE YOUR POTASSIUM CHLORIDE TO TAKING 20 mEq BY MOUTH TWICE DAILY FOR 3 DAYS ONLY, THEN DECREASE TO TAKING 20 mEq BY MOUTH DAILY THEREAFTER--TAKE IN CONCURRENCE WITH TORSEMIDE DOSING  *If you need a refill on your cardiac medications before your next appointment, please call your pharmacy*   Lab Work:  1.)  TODAY--BMET AND PRO-BNP  2.)  IN ONE WEEK HERE IN THE OFFICE--BMET AND PRO-BNP  If you have labs (blood work) drawn today and your tests are completely normal, you will receive your results only by: MyChart Message (if you have MyChart) OR A paper copy in the mail If you have any lab test that is abnormal or we need to change your treatment, we will  call you to review the results.    Testing/Procedures:  Your physician has requested that you have an echocardiogram. Echocardiography is a painless test that uses sound waves to create images of your heart. It provides your doctor with information about the size and shape of your heart and how well your heart's chambers and valves are working. This  procedure takes approximately one hour. There are no restrictions for this procedure. Please do NOT wear cologne, perfume, aftershave, or lotions (deodorant is allowed). Please arrive 15 minutes prior to your appointment time.   Your physician has requested that you have a LEFT lower extremity venous duplex. This test is an ultrasound of the veins in the legs or arms. It looks at venous blood flow that carries blood from the heart to the legs or arms. Allow one hour for a Lower Venous exam. Allow thirty minutes for an Upper Venous exam. There are no restrictions or special instructions.     Follow-Up:  2 WEEKS WITH AN EXTENDER IN THE OFFICE--PLEASE SCHEDULE WITH AN EXTENDER PER DR. Johney Frame --PLEASE ADD TO ANY OPEN ACUTE SLOT ON EXTENDER SCHEDULE PER DR. Johney Frame    Signed, Freada Bergeron, MD  06/26/2022 3:46 PM    [place]

## 2022-06-25 ENCOUNTER — Other Ambulatory Visit (HOSPITAL_BASED_OUTPATIENT_CLINIC_OR_DEPARTMENT_OTHER): Payer: Self-pay

## 2022-06-25 ENCOUNTER — Encounter: Payer: Self-pay | Admitting: Gastroenterology

## 2022-06-26 ENCOUNTER — Ambulatory Visit: Payer: Medicare Other

## 2022-06-26 ENCOUNTER — Other Ambulatory Visit (HOSPITAL_BASED_OUTPATIENT_CLINIC_OR_DEPARTMENT_OTHER): Payer: Self-pay

## 2022-06-26 ENCOUNTER — Encounter: Payer: Self-pay | Admitting: Cardiology

## 2022-06-26 ENCOUNTER — Ambulatory Visit: Payer: Medicare Other | Attending: Cardiology | Admitting: Cardiology

## 2022-06-26 VITALS — BP 126/62 | HR 84 | Ht 65.0 in | Wt 172.0 lb

## 2022-06-26 DIAGNOSIS — E118 Type 2 diabetes mellitus with unspecified complications: Secondary | ICD-10-CM | POA: Insufficient documentation

## 2022-06-26 DIAGNOSIS — I5032 Chronic diastolic (congestive) heart failure: Secondary | ICD-10-CM | POA: Diagnosis not present

## 2022-06-26 DIAGNOSIS — Z79899 Other long term (current) drug therapy: Secondary | ICD-10-CM

## 2022-06-26 DIAGNOSIS — I2510 Atherosclerotic heart disease of native coronary artery without angina pectoris: Secondary | ICD-10-CM

## 2022-06-26 DIAGNOSIS — I480 Paroxysmal atrial fibrillation: Secondary | ICD-10-CM | POA: Diagnosis not present

## 2022-06-26 DIAGNOSIS — D6869 Other thrombophilia: Secondary | ICD-10-CM

## 2022-06-26 DIAGNOSIS — I5033 Acute on chronic diastolic (congestive) heart failure: Secondary | ICD-10-CM

## 2022-06-26 DIAGNOSIS — M7989 Other specified soft tissue disorders: Secondary | ICD-10-CM | POA: Diagnosis not present

## 2022-06-26 DIAGNOSIS — I639 Cerebral infarction, unspecified: Secondary | ICD-10-CM | POA: Diagnosis not present

## 2022-06-26 DIAGNOSIS — I350 Nonrheumatic aortic (valve) stenosis: Secondary | ICD-10-CM

## 2022-06-26 DIAGNOSIS — Z952 Presence of prosthetic heart valve: Secondary | ICD-10-CM | POA: Diagnosis not present

## 2022-06-26 DIAGNOSIS — Z794 Long term (current) use of insulin: Secondary | ICD-10-CM

## 2022-06-26 DIAGNOSIS — I10 Essential (primary) hypertension: Secondary | ICD-10-CM

## 2022-06-26 MED ORDER — TORSEMIDE 20 MG PO TABS
ORAL_TABLET | ORAL | 0 refills | Status: DC
  Filled 2022-06-26: qty 40, 30d supply, fill #0

## 2022-06-26 MED ORDER — POTASSIUM CHLORIDE CRYS ER 20 MEQ PO TBCR
EXTENDED_RELEASE_TABLET | ORAL | 0 refills | Status: DC
  Filled 2022-06-26: qty 40, 30d supply, fill #0

## 2022-06-26 NOTE — Patient Instructions (Addendum)
Medication Instructions:   STOP TAKING FUROSEMIDE (LASIX) NOW  START TAKING TORSEMIDE 20 MG BY MOUTH TWICE DAILY FOR 3 DAYS ONLY, THEN DECREASE TO TAKING 20 MG BY MOUTH DAILY THEREAFTER  INCREASE YOUR POTASSIUM CHLORIDE TO TAKING 20 mEq BY MOUTH TWICE DAILY FOR 3 DAYS ONLY, THEN DECREASE TO TAKING 20 mEq BY MOUTH DAILY THEREAFTER--TAKE IN CONCURRENCE WITH TORSEMIDE DOSING  *If you need a refill on your cardiac medications before your next appointment, please call your pharmacy*   Lab Work:  1.)  TODAY--BMET AND PRO-BNP  2.)  IN ONE WEEK HERE IN THE OFFICE--BMET AND PRO-BNP  If you have labs (blood work) drawn today and your tests are completely normal, you will receive your results only by: MyChart Message (if you have MyChart) OR A paper copy in the mail If you have any lab test that is abnormal or we need to change your treatment, we will call you to review the results.    Testing/Procedures:  Your physician has requested that you have an echocardiogram. Echocardiography is a painless test that uses sound waves to create images of your heart. It provides your doctor with information about the size and shape of your heart and how well your heart's chambers and valves are working. This procedure takes approximately one hour. There are no restrictions for this procedure. Please do NOT wear cologne, perfume, aftershave, or lotions (deodorant is allowed). Please arrive 15 minutes prior to your appointment time.   Your physician has requested that you have a LEFT lower extremity venous duplex. This test is an ultrasound of the veins in the legs or arms. It looks at venous blood flow that carries blood from the heart to the legs or arms. Allow one hour for a Lower Venous exam. Allow thirty minutes for an Upper Venous exam. There are no restrictions or special instructions.     Follow-Up:  2 WEEKS WITH AN EXTENDER IN THE OFFICE--PLEASE SCHEDULE WITH AN EXTENDER PER DR. Johney Frame  --PLEASE ADD TO ANY OPEN ACUTE SLOT ON EXTENDER SCHEDULE PER DR. Johney Frame

## 2022-06-27 DIAGNOSIS — R011 Cardiac murmur, unspecified: Secondary | ICD-10-CM | POA: Diagnosis not present

## 2022-06-27 DIAGNOSIS — Z471 Aftercare following joint replacement surgery: Secondary | ICD-10-CM | POA: Diagnosis not present

## 2022-06-27 DIAGNOSIS — I509 Heart failure, unspecified: Secondary | ICD-10-CM | POA: Diagnosis not present

## 2022-06-27 DIAGNOSIS — I2510 Atherosclerotic heart disease of native coronary artery without angina pectoris: Secondary | ICD-10-CM | POA: Diagnosis not present

## 2022-06-27 DIAGNOSIS — G20A1 Parkinson's disease without dyskinesia, without mention of fluctuations: Secondary | ICD-10-CM | POA: Diagnosis not present

## 2022-06-27 DIAGNOSIS — I110 Hypertensive heart disease with heart failure: Secondary | ICD-10-CM | POA: Diagnosis not present

## 2022-06-27 LAB — BASIC METABOLIC PANEL WITH GFR
BUN/Creatinine Ratio: 28 (ref 12–28)
BUN: 21 mg/dL (ref 8–27)
CO2: 20 mmol/L (ref 20–29)
Calcium: 10.7 mg/dL — ABNORMAL HIGH (ref 8.7–10.3)
Chloride: 104 mmol/L (ref 96–106)
Creatinine, Ser: 0.76 mg/dL (ref 0.57–1.00)
Glucose: 190 mg/dL — ABNORMAL HIGH (ref 70–99)
Potassium: 4.2 mmol/L (ref 3.5–5.2)
Sodium: 141 mmol/L (ref 134–144)
eGFR: 80 mL/min/1.73

## 2022-06-27 LAB — PRO B NATRIURETIC PEPTIDE: NT-Pro BNP: 924 pg/mL — ABNORMAL HIGH (ref 0–738)

## 2022-06-28 ENCOUNTER — Telehealth: Payer: Self-pay | Admitting: Cardiology

## 2022-06-28 DIAGNOSIS — R011 Cardiac murmur, unspecified: Secondary | ICD-10-CM | POA: Diagnosis not present

## 2022-06-28 DIAGNOSIS — I110 Hypertensive heart disease with heart failure: Secondary | ICD-10-CM | POA: Diagnosis not present

## 2022-06-28 DIAGNOSIS — G20A1 Parkinson's disease without dyskinesia, without mention of fluctuations: Secondary | ICD-10-CM | POA: Diagnosis not present

## 2022-06-28 DIAGNOSIS — I2510 Atherosclerotic heart disease of native coronary artery without angina pectoris: Secondary | ICD-10-CM | POA: Diagnosis not present

## 2022-06-28 DIAGNOSIS — I509 Heart failure, unspecified: Secondary | ICD-10-CM | POA: Diagnosis not present

## 2022-06-28 DIAGNOSIS — Z471 Aftercare following joint replacement surgery: Secondary | ICD-10-CM | POA: Diagnosis not present

## 2022-06-28 NOTE — Telephone Encounter (Signed)
Patient's husband called to inform Dr. Johney Frame and Karlene Einstein, LPN, that the patient has had no changes in weight and or swelling. He states this was discussed during her appointment on Tuesday, 3/05.

## 2022-06-29 ENCOUNTER — Telehealth: Payer: Self-pay | Admitting: Cardiology

## 2022-06-29 ENCOUNTER — Other Ambulatory Visit (HOSPITAL_COMMUNITY): Payer: Medicare Other

## 2022-06-29 ENCOUNTER — Encounter (HOSPITAL_COMMUNITY): Payer: Self-pay

## 2022-06-29 ENCOUNTER — Ambulatory Visit (HOSPITAL_COMMUNITY): Payer: Medicare Other

## 2022-06-29 ENCOUNTER — Encounter (HOSPITAL_COMMUNITY): Payer: Self-pay | Admitting: Radiology

## 2022-06-29 NOTE — Telephone Encounter (Signed)
See previous phone note from today about this and what Dr. Johney Frame said.

## 2022-06-29 NOTE — Telephone Encounter (Signed)
Spoke with the pt and wife.  Informed them that neither Dr. Johney Frame nor myself called them this morning.   They are aware that she is still scheduled to have her echo with our office today.  Informed them that the diuretic will start working soon, but it will take a couple days to see a difference, being the pt just started this yesterday.  They are aware that I will call them with her echo results once complete and Dr. Johney Frame reviews.  Both verbalized understanding and agrees with this plan.

## 2022-06-29 NOTE — Progress Notes (Signed)
Patient ID: Breanna Webster, female   DOB: 16-Nov-1945, 77 y.o.   MRN: [id number] Patient arrived for echocardiogram. Patient was in extreme pain due to left shoulder surgery. Patient was unable to lay flat due to surgery. Patient made attempt to lay on back to do the echocardiogram. Patient still experienced extreme pain. Exam was cancelled due to pain. Dr. Johney Frame was notified. We will call patient back in 2 weeks to assess recovery of shoulder.

## 2022-06-29 NOTE — Telephone Encounter (Signed)
Called patient's cell phone # to try and answer her questions, left message with no identifiers asking recipient of message to call our office. Patient with Echo today at [address] office.

## 2022-06-29 NOTE — Telephone Encounter (Signed)
Pt's husband [name] is returning someone's call. He states that he had just missed a call from our number at 8:59am.

## 2022-07-02 ENCOUNTER — Ambulatory Visit (HOSPITAL_COMMUNITY)
Admission: RE | Admit: 2022-07-02 | Discharge: 2022-07-02 | Disposition: A | Discharge status: Home / Self Care | Payer: Medicare Other | Source: Ambulatory Visit | Attending: Internal Medicine | Admitting: Internal Medicine

## 2022-07-02 DIAGNOSIS — Z79899 Other long term (current) drug therapy: Secondary | ICD-10-CM | POA: Diagnosis not present

## 2022-07-02 DIAGNOSIS — M7989 Other specified soft tissue disorders: Secondary | ICD-10-CM | POA: Diagnosis not present

## 2022-07-03 ENCOUNTER — Ambulatory Visit: Payer: Medicare Other | Attending: Cardiology

## 2022-07-03 DIAGNOSIS — Z952 Presence of prosthetic heart valve: Secondary | ICD-10-CM

## 2022-07-03 DIAGNOSIS — I5032 Chronic diastolic (congestive) heart failure: Secondary | ICD-10-CM | POA: Diagnosis not present

## 2022-07-03 DIAGNOSIS — I5033 Acute on chronic diastolic (congestive) heart failure: Secondary | ICD-10-CM | POA: Diagnosis not present

## 2022-07-03 DIAGNOSIS — Z794 Long term (current) use of insulin: Secondary | ICD-10-CM

## 2022-07-03 DIAGNOSIS — M7989 Other specified soft tissue disorders: Secondary | ICD-10-CM | POA: Diagnosis not present

## 2022-07-03 DIAGNOSIS — Z79899 Other long term (current) drug therapy: Secondary | ICD-10-CM

## 2022-07-03 DIAGNOSIS — E118 Type 2 diabetes mellitus with unspecified complications: Secondary | ICD-10-CM | POA: Diagnosis not present

## 2022-07-03 DIAGNOSIS — E1149 Type 2 diabetes mellitus with other diabetic neurological complication: Secondary | ICD-10-CM | POA: Diagnosis not present

## 2022-07-03 DIAGNOSIS — E1165 Type 2 diabetes mellitus with hyperglycemia: Secondary | ICD-10-CM | POA: Diagnosis not present

## 2022-07-03 DIAGNOSIS — E039 Hypothyroidism, unspecified: Secondary | ICD-10-CM | POA: Diagnosis not present

## 2022-07-04 DIAGNOSIS — R011 Cardiac murmur, unspecified: Secondary | ICD-10-CM | POA: Diagnosis not present

## 2022-07-04 DIAGNOSIS — I509 Heart failure, unspecified: Secondary | ICD-10-CM | POA: Diagnosis not present

## 2022-07-04 DIAGNOSIS — I110 Hypertensive heart disease with heart failure: Secondary | ICD-10-CM | POA: Diagnosis not present

## 2022-07-04 DIAGNOSIS — Z471 Aftercare following joint replacement surgery: Secondary | ICD-10-CM | POA: Diagnosis not present

## 2022-07-04 DIAGNOSIS — I2510 Atherosclerotic heart disease of native coronary artery without angina pectoris: Secondary | ICD-10-CM | POA: Diagnosis not present

## 2022-07-04 DIAGNOSIS — G20A1 Parkinson's disease without dyskinesia, without mention of fluctuations: Secondary | ICD-10-CM | POA: Diagnosis not present

## 2022-07-04 LAB — BASIC METABOLIC PANEL
BUN/Creatinine Ratio: 27 (ref 12–28)
BUN: 23 mg/dL (ref 8–27)
CO2: 23 mmol/L (ref 20–29)
Calcium: 10.6 mg/dL — ABNORMAL HIGH (ref 8.7–10.3)
Chloride: 100 mmol/L (ref 96–106)
Creatinine, Ser: 0.86 mg/dL (ref 0.57–1.00)
Glucose: 126 mg/dL — ABNORMAL HIGH (ref 70–99)
Potassium: 4 mmol/L (ref 3.5–5.2)
Sodium: 138 mmol/L (ref 134–144)
eGFR: 69 mL/min/{1.73_m2} (ref >59)

## 2022-07-04 LAB — PRO B NATRIURETIC PEPTIDE: NT-Pro BNP: 503 pg/mL (ref 0–738)

## 2022-07-05 ENCOUNTER — Other Ambulatory Visit (HOSPITAL_BASED_OUTPATIENT_CLINIC_OR_DEPARTMENT_OTHER): Payer: Self-pay

## 2022-07-05 DIAGNOSIS — M47812 Spondylosis without myelopathy or radiculopathy, cervical region: Secondary | ICD-10-CM | POA: Diagnosis not present

## 2022-07-05 DIAGNOSIS — M5412 Radiculopathy, cervical region: Secondary | ICD-10-CM | POA: Diagnosis not present

## 2022-07-05 DIAGNOSIS — Z4789 Encounter for other orthopedic aftercare: Secondary | ICD-10-CM | POA: Diagnosis not present

## 2022-07-05 MED ORDER — PREGABALIN 75 MG PO CAPS
75.0000 mg | ORAL_CAPSULE | Freq: Two times a day (BID) | ORAL | 0 refills | Status: DC
  Filled 2022-07-05: qty 60, 30d supply, fill #0

## 2022-07-05 MED ORDER — OXYCODONE-ACETAMINOPHEN 5-325 MG PO TABS
1.0000 | ORAL_TABLET | Freq: Four times a day (QID) | ORAL | 0 refills | Status: DC
  Filled 2022-07-05: qty 60, 15d supply, fill #0

## 2022-07-10 ENCOUNTER — Encounter: Payer: Self-pay | Admitting: Adult Health

## 2022-07-10 ENCOUNTER — Ambulatory Visit: Payer: Medicare Other | Attending: Physician Assistant | Admitting: Physician Assistant

## 2022-07-10 ENCOUNTER — Encounter: Payer: Self-pay | Admitting: Physician Assistant

## 2022-07-10 ENCOUNTER — Ambulatory Visit (INDEPENDENT_AMBULATORY_CARE_PROVIDER_SITE_OTHER): Payer: Medicare Other | Admitting: Adult Health

## 2022-07-10 ENCOUNTER — Other Ambulatory Visit (HOSPITAL_BASED_OUTPATIENT_CLINIC_OR_DEPARTMENT_OTHER): Payer: Self-pay

## 2022-07-10 VITALS — BP 104/60 | HR 81 | Ht 65.0 in | Wt 170.0 lb

## 2022-07-10 VITALS — BP 110/60 | HR 76 | Temp 97.6°F

## 2022-07-10 DIAGNOSIS — I10 Essential (primary) hypertension: Secondary | ICD-10-CM | POA: Diagnosis not present

## 2022-07-10 DIAGNOSIS — R600 Localized edema: Secondary | ICD-10-CM

## 2022-07-10 DIAGNOSIS — Z79899 Other long term (current) drug therapy: Secondary | ICD-10-CM | POA: Diagnosis not present

## 2022-07-10 DIAGNOSIS — K7460 Unspecified cirrhosis of liver: Secondary | ICD-10-CM | POA: Diagnosis not present

## 2022-07-10 DIAGNOSIS — G20A1 Parkinson's disease without dyskinesia, without mention of fluctuations: Secondary | ICD-10-CM | POA: Diagnosis not present

## 2022-07-10 DIAGNOSIS — R0989 Other specified symptoms and signs involving the circulatory and respiratory systems: Secondary | ICD-10-CM

## 2022-07-10 DIAGNOSIS — E118 Type 2 diabetes mellitus with unspecified complications: Secondary | ICD-10-CM | POA: Diagnosis not present

## 2022-07-10 DIAGNOSIS — I5032 Chronic diastolic (congestive) heart failure: Secondary | ICD-10-CM | POA: Insufficient documentation

## 2022-07-10 DIAGNOSIS — K219 Gastro-esophageal reflux disease without esophagitis: Secondary | ICD-10-CM | POA: Diagnosis not present

## 2022-07-10 DIAGNOSIS — M545 Low back pain, unspecified: Secondary | ICD-10-CM | POA: Diagnosis not present

## 2022-07-10 DIAGNOSIS — I350 Nonrheumatic aortic (valve) stenosis: Secondary | ICD-10-CM | POA: Insufficient documentation

## 2022-07-10 DIAGNOSIS — D563 Thalassemia minor: Secondary | ICD-10-CM | POA: Diagnosis not present

## 2022-07-10 DIAGNOSIS — Z955 Presence of coronary angioplasty implant and graft: Secondary | ICD-10-CM | POA: Diagnosis not present

## 2022-07-10 DIAGNOSIS — R911 Solitary pulmonary nodule: Secondary | ICD-10-CM | POA: Diagnosis not present

## 2022-07-10 DIAGNOSIS — Z8719 Personal history of other diseases of the digestive system: Secondary | ICD-10-CM | POA: Diagnosis not present

## 2022-07-10 DIAGNOSIS — K589 Irritable bowel syndrome without diarrhea: Secondary | ICD-10-CM | POA: Diagnosis not present

## 2022-07-10 DIAGNOSIS — I509 Heart failure, unspecified: Secondary | ICD-10-CM | POA: Diagnosis not present

## 2022-07-10 DIAGNOSIS — E119 Type 2 diabetes mellitus without complications: Secondary | ICD-10-CM | POA: Diagnosis not present

## 2022-07-10 DIAGNOSIS — Z952 Presence of prosthetic heart valve: Secondary | ICD-10-CM

## 2022-07-10 DIAGNOSIS — F419 Anxiety disorder, unspecified: Secondary | ICD-10-CM | POA: Diagnosis not present

## 2022-07-10 DIAGNOSIS — M8580 Other specified disorders of bone density and structure, unspecified site: Secondary | ICD-10-CM | POA: Diagnosis not present

## 2022-07-10 DIAGNOSIS — Z96612 Presence of left artificial shoulder joint: Secondary | ICD-10-CM | POA: Diagnosis not present

## 2022-07-10 DIAGNOSIS — Z794 Long term (current) use of insulin: Secondary | ICD-10-CM | POA: Diagnosis not present

## 2022-07-10 DIAGNOSIS — K5790 Diverticulosis of intestine, part unspecified, without perforation or abscess without bleeding: Secondary | ICD-10-CM | POA: Diagnosis not present

## 2022-07-10 DIAGNOSIS — I110 Hypertensive heart disease with heart failure: Secondary | ICD-10-CM | POA: Diagnosis not present

## 2022-07-10 DIAGNOSIS — I480 Paroxysmal atrial fibrillation: Secondary | ICD-10-CM | POA: Insufficient documentation

## 2022-07-10 DIAGNOSIS — I2510 Atherosclerotic heart disease of native coronary artery without angina pectoris: Secondary | ICD-10-CM | POA: Diagnosis not present

## 2022-07-10 DIAGNOSIS — M1990 Unspecified osteoarthritis, unspecified site: Secondary | ICD-10-CM | POA: Diagnosis not present

## 2022-07-10 DIAGNOSIS — G8929 Other chronic pain: Secondary | ICD-10-CM | POA: Diagnosis not present

## 2022-07-10 DIAGNOSIS — Z86010 Personal history of colonic polyps: Secondary | ICD-10-CM | POA: Diagnosis not present

## 2022-07-10 DIAGNOSIS — Z8673 Personal history of transient ischemic attack (TIA), and cerebral infarction without residual deficits: Secondary | ICD-10-CM | POA: Diagnosis not present

## 2022-07-10 DIAGNOSIS — R011 Cardiac murmur, unspecified: Secondary | ICD-10-CM | POA: Diagnosis not present

## 2022-07-10 DIAGNOSIS — E039 Hypothyroidism, unspecified: Secondary | ICD-10-CM | POA: Diagnosis not present

## 2022-07-10 DIAGNOSIS — Z471 Aftercare following joint replacement surgery: Secondary | ICD-10-CM | POA: Diagnosis not present

## 2022-07-10 MED ORDER — SPIRONOLACTONE 25 MG PO TABS
12.5000 mg | ORAL_TABLET | Freq: Every day | ORAL | 3 refills | Status: DC
  Filled 2022-07-10: qty 45, 90d supply, fill #0

## 2022-07-10 MED ORDER — LOSARTAN POTASSIUM 25 MG PO TABS
25.0000 mg | ORAL_TABLET | Freq: Every day | ORAL | 3 refills | Status: DC
  Filled 2022-07-10: qty 90, 90d supply, fill #0

## 2022-07-10 NOTE — Progress Notes (Signed)
Office Visit    Patient Name: Breanna Webster Date of Encounter: 07/10/2022  PCP:  [name], NP   [facility]  Cardiologist:  Freada Bergeron, MD  Advanced Practice Provider:  No care team member to display Electrophysiologist:  None   HPI    Breanna Webster is a 77 y.o. female with a past medical history of diabetes mellitus type 2, Graves' disease/hypothyroidism, NASH, cirrhosis, primary hypertension, chronic diastolic heart failure, calcified mitral annulus, pulmonary hypertension, CAD status post PCI/DES x 1 to mRCA (12/19), mitral valve endocarditis [id number], PAF, embolic cerebellar CVA 0000000, thalassemia, NASH cirrhosis, iron deficiency anemia, and severe AS now status post TAVR (06/2018)  presents today for follow-up appointment.  Patient was having shortness of breath with concern of diastolic heart failure was a component.  Difficulty taking Lasix.  Recommended SGLT2 therapy but she pushed back.  She was having a lot of pain and she was getting shots in her knee and shoulders which had not worked well.  Still having dyspnea on exertion.  Contemplated SGLT2 therapy but felt cost is prohibitive.  Today, she states that she has had some.  She thinks it might be due to her low blood pressure.  She is borderline low today here in the clinic.  She does not currently have a blood pressure cuff but does have a wrist cuff.  I have encouraged her to get a new blood pressure cuff.  She does have some lower extremity edema left greater than right which is usual for her she has had extremity ultrasounds without DVT.  We have made some adjustments to her medications today to try to get her swelling down further.  I suggested lower extremity compression hose which she has tried in the past without success.  Reports no shortness of breath nor dyspnea on exertion. Reports no chest pain, pressure, or tightness. No edema, orthopnea, PND. Reports no palpitations.     Past Medical History    Past Medical History:  Diagnosis Date   Anxiety    Arthritis    "back" (04/22/2018)   Back pain    Blood transfusion without reported diagnosis    CAD (coronary artery disease)    a. 03/2018 s/p PCI/DES to the RCA (3.0x15 Onyx DES).   Carotid artery stenosis    Mild   Chest pain    CHF (congestive heart failure) (HCC)    Chronic lower back pain    Cirrhosis (HCC)    Colon polyps    Diverticulitis    Diverticulosis    Esophageal thickening    seen on pre TAVR CT scan, also questionable cirrhosis. MRI recommended. Will refer to GI   GERD (gastroesophageal reflux disease)    Grave's disease    Heart murmur    History of colonic polyps 05/22/2017   History of hiatal hernia    Hyperlipidemia    Hypertension    Hypothyroidism    IBS (irritable bowel syndrome)    Osteopenia    Parkinson's syndrome 04/23/2020   Pulmonary nodules    seen on pre TAVR CT. likley benign. no follow up recommended if pt low risk.   S/P TAVR (transcatheter aortic valve replacement)    Shortness of breath on exertion    Stroke [facility]) 2021   x2   Thalassemia minor    Thyroid disease    Type II diabetes mellitus ([place])    Past Surgical History:  Procedure Laterality Date   37 HOUR PH  STUDY N/A 03/03/2018   Procedure: 24 HOUR PH STUDY;  Surgeon: Mauri Pole, MD;  Location: [facility];  Service: Endoscopy;  Laterality: N/A;   COLONOSCOPY  2023   COLONOSCOPY W/ BIOPSIES AND POLYPECTOMY     CORONARY ANGIOGRAPHY Right 04/21/2018   Procedure: CORONARY ANGIOGRAPHY (CATH LAB);  Surgeon: [name], MD;  Location: [place] [facility];  Service: Cardiovascular;  Laterality: Right;   CORONARY STENT INTERVENTION N/A 04/22/2018   Procedure: CORONARY STENT INTERVENTION;  Surgeon: [name], MD;  Location: [place] [facility];  Service: Cardiovascular;  Laterality: N/A;   DILATION AND CURETTAGE OF UTERUS     ESOPHAGEAL MANOMETRY N/A 03/03/2018   Procedure: ESOPHAGEAL  MANOMETRY (EM);  Surgeon: Mauri Pole, MD;  Location: [facility];  Service: Endoscopy;  Laterality: N/A;   HYSTEROSCOPY     fibroids   LAPAROSCOPIC CHOLECYSTECTOMY  1985   LAPAROSCOPY     fibroids   NISSEN FUNDOPLICATION  [id number]   POLYPECTOMY     REVERSE SHOULDER ARTHROPLASTY Left 06/01/2022   Procedure: REVERSE SHOULDER ARTHROPLASTY;  Surgeon: Netta Cedars, MD;  Location: [facility];  Service: Orthopedics;  Laterality: Left;  choice with interscalene block   RIGHT/LEFT HEART CATH AND CORONARY ANGIOGRAPHY N/A 02/20/2018   Procedure: RIGHT/LEFT HEART CATH AND CORONARY ANGIOGRAPHY;  Surgeon: [name], MD;  Location: [place] [facility];  Service: Cardiovascular;  Laterality: N/A;   TEE WITHOUT CARDIOVERSION N/A 07/08/2018   Procedure: TRANSESOPHAGEAL ECHOCARDIOGRAM (TEE);  Surgeon: Burnell Blanks, MD;  Location: [place];  Service: Open Heart Surgery;  Laterality: N/A;   TEE WITHOUT CARDIOVERSION  10/07/2018   TEE WITHOUT CARDIOVERSION N/A 10/07/2018   Procedure: TRANSESOPHAGEAL ECHOCARDIOGRAM (TEE);  Surgeon: Jerline Pain, MD;  Location: [facility] ENDOSCOPY;  Service: Cardiovascular;  Laterality: N/A;   TONSILLECTOMY     age 7   TRANSCATHETER AORTIC VALVE REPLACEMENT, TRANSFEMORAL N/A 07/08/2018   Procedure: TRANSCATHETER AORTIC VALVE REPLACEMENT, TRANSFEMORAL;  Surgeon: Burnell Blanks, MD;  Location: [place];  Service: Open Heart Surgery;  Laterality: N/A;    Allergies  No Known Allergies   EKGs/Labs/Other Studies Reviewed:   The following studies were reviewed today: 2D Doppler echocardiogram 2022: IMPRESSIONS   1. Left ventricular ejection fraction, by estimation, is 60 to 65%. The  left ventricle has normal function. The left ventricle has no regional  wall motion abnormalities. There is mild left ventricular hypertrophy.  Left ventricular diastolic parameters  are consistent with Grade I diastolic dysfunction (impaired relaxation).  The average left  ventricular global longitudinal strain is -19.9 %. The  global longitudinal strain is normal.   2. Right ventricular systolic function is normal. The right ventricular  size is normal.   3. Left atrial size was moderately dilated.   4. The mitral valve is normal in structure. No evidence of mitral valve  regurgitation. Mild mitral stenosis. The mean mitral valve gradient is 4.5  mmHg. Severe mitral annular calcification.   5. The aortic valve has been repaired/replaced. Aortic valve  regurgitation is not visualized. No aortic stenosis is present. There is a  23 mm Edwards Sapien prosthetic (TAVR) valve present in the aortic  position. Echo findings are consistent with normal   structure and function of the aortic valve prosthesis. Aortic valve area,  by VTI measures 1.99 cm. Aortic valve mean gradient measures 15.0 mmHg.  Aortic valve Vmax measures 2.43 m/s.   6. The inferior vena cava is normal in size with greater than 50%  respiratory variability, suggesting right atrial pressure of 3 mmHg.   Comparison(s): No significant change from prior study. Prior images  reviewed side by side.     EKG:  EKG is not ordered today.   Recent Labs: 05/16/2022: ALT 28; Platelets 158 06/02/2022: Hemoglobin 8.8 07/03/2022: BUN 23; Creatinine, Ser 0.86; NT-Pro BNP 503; Potassium 4.0; Sodium 138  Recent Lipid Panel    Component Value Date/Time   CHOL 106 09/06/2021 1018   CHOL 156 11/21/2016 1113   TRIG 101 09/06/2021 1018   HDL 50 09/06/2021 1018   HDL 44 11/21/2016 1113   CHOLHDL 2.1 09/06/2021 1018   VLDL 20 09/06/2021 1018   LDLCALC 36 09/06/2021 1018   LDLCALC 91 11/21/2016 1113   LDLDIRECT 98.0 05/24/2014 1343     Home Medications   Current Meds  Medication Sig   acetaminophen (TYLENOL) 500 MG tablet Take 500 mg by mouth every 6 (six) hours as needed for mild pain.   apixaban (ELIQUIS) 5 MG TABS tablet Take 1 tablet (5 mg total) by mouth 2 (two) times daily.   b complex vitamins  capsule Take 1 capsule by mouth daily.   BD PEN NEEDLE NANO 2ND GEN 32G X 4 MM MISC    Blood Glucose Monitoring Suppl (ONETOUCH VERIO REFLECT) w/Device KIT Use as directed three times daily   cholecalciferol (VITAMIN D) 25 MCG (1000 UNIT) tablet Take 1,000 Units by mouth daily.   Continuous Blood Gluc Receiver (FREESTYLE LIBRE READER) DEVI Use as directed   Continuous Blood Gluc Sensor (FREESTYLE LIBRE 3 SENSOR) MISC Place one sensor every 14 days   diltiazem (CARDIZEM CD) 120 MG 24 hr capsule Take 1 capsule (120 mg total) by mouth daily.   escitalopram (LEXAPRO) 20 MG tablet TAKE ONE TABLET BY MOUTH DAILY   Evolocumab (REPATHA SURECLICK) [id number] MG/ML SOAJ Inject 1 dose into the skin every 14 (fourteen) days.   glucose blood (ONETOUCH VERIO) test strip USE TO CHECK BLOOD SUGAR THREE TIMES DAILY   influenza vaccine adjuvanted (FLUAD) 0.5 ML injection Inject into the muscle.   insulin NPH Human (HUMULIN N) 100 UNIT/ML injection Inject 20 units under the skin in the morning and 14 units in evening   Insulin NPH, Human,, Isophane, (HUMULIN N KWIKPEN) 100 UNIT/ML Kiwkpen Inject 20 units into the skin every morning and 14 units every evening.   Insulin Pen Needle (UNIFINE PENTIPS) 32G X 4 MM MISC Inject 4 times subcutaneously   insulin regular (HUMULIN R) 100 units/mL injection Inject 10 Units total into the skin 3 (three) times daily before meals.   Insulin Regular Human (NOVOLIN R FLEXPEN RELION) 100 UNIT/ML KwikPen Inject 5 to 10 units under the skin three times daily.   Insulin Syringe-Needle U-100 (ULTICARE INSULIN SYRINGE) 31G X 5/16" 1 ML MISC Inject 4 times daily subcutaneously   Lancets (ONETOUCH DELICA PLUS 123XX123) MISC Use twice daily   levothyroxine (SYNTHROID) 137 MCG tablet Take 1 tablet by mouth every morning on an empty stomach  BRAND NAME ONLY   metFORMIN (GLUCOPHAGE-XR) 500 MG 24 hr tablet Take 1 tablet by mouth 2 times daily   Multiple Vitamin (MULITIVITAMIN WITH MINERALS) TABS Take  1 tablet by mouth daily.   nitroGLYCERIN (NITROSTAT) 0.4 MG SL tablet Place 1 tablet (0.4 mg total) under the tongue every 5 (five) minutes as needed for chest pain.   omeprazole (PRILOSEC) 20 MG capsule Take 1 capsule (20 mg total) by mouth daily.   oxyCODONE-acetaminophen (PERCOCET) 5-325 MG tablet Take 1 tablet  by mouth every 6 (six) hours.   potassium chloride SA (KLOR-CON M) 20 MEQ tablet Take 1 tablet (20 mEq total) by mouth twice daily for 3 days only, then decrease to taking 1 tablet (20 mEq total) by mouth daily thereafter.  Take in concurrence with torsemide.   pregabalin (LYRICA) 75 MG capsule Take 1 capsule (75 mg total) by mouth 2 (two) times daily.   spironolactone (ALDACTONE) 25 MG tablet Take 0.5 tablets (12.5 mg total) by mouth daily.   torsemide (DEMADEX) 20 MG tablet Take 1 tablet (20 mg total) by mouth twice daily for 3 days only, then decrease to taking 1 tablet (20 mg total) by mouth daily thereafter.   [DISCONTINUED] losartan (COZAAR) 50 MG tablet TAKE ONE TABLET BY MOUTH DAILY     Review of Systems      All other systems reviewed and are otherwise negative except as noted above.  Physical Exam    VS:  BP 104/60   Pulse 81   Ht 5\' 5"  (1.651 m)   Wt 170 lb (77.1 kg)   SpO2 97%   BMI 28.29 kg/m  , BMI Body mass index is 28.29 kg/m.  Wt Readings from Last 3 Encounters:  07/10/22 170 lb (77.1 kg)  06/26/22 172 lb (78 kg)  06/01/22 172 lb (78 kg)     GEN: Well nourished, well developed, in no acute distress. HEENT: normal. Neck: Supple, no JVD, carotid bruits, or masses. Cardiac: RRR, 4/6 systolic murmur, rubs, or gallops. No clubbing, cyanosis, edema.  Radials/PT 2+ and equal bilaterally.  Respiratory:  Respirations regular and unlabored, clear to auscultation bilaterally. GI: Soft, nontender, nondistended. MS: No deformity or atrophy. Skin: Warm and dry, no rash. Neuro:  Strength and sensation are intact. Psych: Normal affect.  Assessment & Plan     Coronary artery disease -No current chest pain -Continue current medications which include Eliquis 5 mg twice daily, Cardizem CD 120 mg daily, Repatha 140 mg/mL every 2 weeks losartan (recently decreased to 25 mg daily), Nitro as needed Demadex 20 mg daily, potassium 20 mEq daily  Paroxysmal atrial fibrillation -Normal sinus rhythm today, heart rate 81 bpm -Remains on Eliquis and Cardizem -Echocardiogram ordered  Chronic heart failure with preserved ejection fraction -Update echocardiogram -Does still have some lower extremity edema -Compression hose recommended acute medications does not tolerate -She is now on Demadex 20 mg daily without good control so we will add spironolactone 12.5mg  daily -Decrease losartan to make more room for diuresis -Encouraged low-sodium diet -Encourage daily weights -follow-up BMP in two weeks  AS status post TAVR -update echo  Hyperlipidemia -Most recent LDL 136, HDL 50, triglycerides 86 -Will be due for updated lipid panel at next visit  LBBB -stable, no EKG this visit  Hypertension -Low normal today, 104/60, some dizziness -We have decreased her losartan to 25 mg daily and asked her to keep track of her blood pressure -We have asked her to obtain a new blood pressure cuff        Disposition: Follow up 4 weeks with Freada Bergeron, MD or APP.  Signed, Elgie Collard, PA-C 07/10/2022, 3:57 PM [facility]

## 2022-07-10 NOTE — Progress Notes (Signed)
Subjective:    Patient ID: Breanna Webster, female    DOB: Dec 09, 1945, 77 y.o.   MRN: [id number]  HPI 77 year old female who  has a past medical history of Anxiety, Arthritis, Back pain, Blood transfusion without reported diagnosis, CAD (coronary artery disease), Carotid artery stenosis, Chest pain, CHF (congestive heart failure) (HCC), Chronic lower back pain, Cirrhosis ([place]), Colon polyps, Diverticulitis, Diverticulosis, Esophageal thickening, GERD (gastroesophageal reflux disease), Grave's disease, Heart murmur, History of colonic polyps (05/22/2017), History of hiatal hernia, Hyperlipidemia, Hypertension, Hypothyroidism, IBS (irritable bowel syndrome), Osteopenia, Parkinson's syndrome (04/23/2020), Pulmonary nodules, S/P TAVR (transcatheter aortic valve replacement), Shortness of breath on exertion, Stroke ([place]) (2021), Thalassemia minor, Thyroid disease, and Type II diabetes mellitus ([place]).  She presents to the office today for follow-up.  She had reverse left shoulder arthroplasty done roughly 6 weeks ago. Per patient she was advised to follow-up with her PCP due to concern of hearing "rails" when the surgeon was listening to breath sounds.  She has not had any fevers or chills.  She has had some worsening DOE with lower extremity edema since she was discharged after her shoulder surgery.  Shortness of breath seems to be more with exertion and when lying flat.  He has been seen by cardiology, echo was not able to be completed because she cannot lay flat on her back without being in significant pain.  She has an appointment for follow-up echo in another couple weeks.  She did have ultrasound to rule out DVT which was negative.  Has been taking her Eliquis as directed.  Cardiology recently changed her furosemide to torsemide and added potassium supplementation.  Suezanne Jacquet reports that he did notice some mild improvement during the first few days of taking torsemide but the swelling has reverted to what was  there prior to initiating torsemide therapy.  She does have a follow up with Cardiology later today    Review of Systems See HPI   Past Medical History:  Diagnosis Date   Anxiety    Arthritis    "back" (04/22/2018)   Back pain    Blood transfusion without reported diagnosis    CAD (coronary artery disease)    a. 03/2018 s/p PCI/DES to the RCA (3.0x15 Onyx DES).   Carotid artery stenosis    Mild   Chest pain    CHF (congestive heart failure) (HCC)    Chronic lower back pain    Cirrhosis (HCC)    Colon polyps    Diverticulitis    Diverticulosis    Esophageal thickening    seen on pre TAVR CT scan, also questionable cirrhosis. MRI recommended. Will refer to GI   GERD (gastroesophageal reflux disease)    Grave's disease    Heart murmur    History of colonic polyps 05/22/2017   History of hiatal hernia    Hyperlipidemia    Hypertension    Hypothyroidism    IBS (irritable bowel syndrome)    Osteopenia    Parkinson's syndrome 04/23/2020   Pulmonary nodules    seen on pre TAVR CT. likley benign. no follow up recommended if pt low risk.   S/P TAVR (transcatheter aortic valve replacement)    Shortness of breath on exertion    Stroke [facility]) 2021   x2   Thalassemia minor    Thyroid disease    Type II diabetes mellitus ([place])     Social History   Socioeconomic History   Marital status: Married    Spouse name: Not  on file   Number of children: 2   Years of education: Not on file   Highest education level: Not on file  Occupational History   Occupation: Tree surgeon of the [employer]  Tobacco Use   Smoking status: Never    Passive exposure: Never   Smokeless tobacco: Never  Vaping Use   Vaping Use: Never used  Substance and Sexual Activity   Alcohol use: No    Alcohol/week: 0.0 standard drinks of alcohol   Drug use: Never   Sexual activity: Not Currently  Other Topics Concern   Not on file  Social History Narrative   Lives with husband in a one  story home.     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   Education: college.   Social Determinants of Health   Financial Resource Strain: Medium Risk (11/13/2021)   Overall Financial Resource Strain (CARDIA)    Difficulty of Paying Living Expenses: Somewhat hard  Food Insecurity: No Food Insecurity (06/01/2022)   Hunger Vital Sign    Worried About Running Out of Food in the Last Year: Never true    Ran Out of Food in the Last Year: Never true  Transportation Needs: No Transportation Needs (06/01/2022)   PRAPARE - Hydrologist (Medical): No    Lack of Transportation (Non-Medical): No  Physical Activity: Inactive (04/03/2021)   Exercise Vital Sign    Days of Exercise per Week: 0 days    Minutes of Exercise per Session: 0 min  Stress: No Stress Concern Present (04/03/2021)   [place]    Feeling of Stress : Not at all  Social Connections: [place] (04/03/2021)   Social Connection and Isolation Panel [NHANES]    Frequency of Communication with Friends and Family: More than three times a week    Frequency of Social Gatherings with Friends and Family: More than three times a week    Attends Religious Services: More than 4 times per year    Active Member of Clubs or Organizations: Yes    Attends Archivist Meetings: More than 4 times per year    Marital Status: Married  Human resources officer Violence: Not At Risk (06/01/2022)   Humiliation, Afraid, Rape, and Kick questionnaire    Fear of Current or Ex-Partner: No    Emotionally Abused: No    Physically Abused: No    Sexually Abused: No    Past Surgical History:  Procedure Laterality Date   71 HOUR [place] STUDY N/A 03/03/2018   Procedure: 24 HOUR [place];  Surgeon: Mauri Pole, MD;  Location: [facility];  Service: Endoscopy;  Laterality: N/A;    COLONOSCOPY  2023   COLONOSCOPY W/ BIOPSIES AND POLYPECTOMY     CORONARY ANGIOGRAPHY Right 04/21/2018   Procedure: CORONARY ANGIOGRAPHY (CATH LAB);  Surgeon: [name], MD;  Location: [place] [facility];  Service: Cardiovascular;  Laterality: Right;   CORONARY STENT INTERVENTION N/A 04/22/2018   Procedure: CORONARY STENT INTERVENTION;  Surgeon: [name], MD;  Location: [place] CV LAB;  Service: Cardiovascular;  Laterality: N/A;   DILATION AND CURETTAGE OF UTERUS     ESOPHAGEAL MANOMETRY N/A 03/03/2018   Procedure: ESOPHAGEAL MANOMETRY (EM);  Surgeon: Mauri Pole, MD;  Location: [facility];  Service: Endoscopy;  Laterality: N/A;   HYSTEROSCOPY     fibroids   LAPAROSCOPIC CHOLECYSTECTOMY  1985   LAPAROSCOPY     fibroids   NISSEN FUNDOPLICATION  [id number]   POLYPECTOMY     REVERSE SHOULDER ARTHROPLASTY Left 06/01/2022   Procedure: REVERSE SHOULDER ARTHROPLASTY;  Surgeon: Netta Cedars, MD;  Location: [facility];  Service: Orthopedics;  Laterality: Left;  choice with interscalene block   RIGHT/LEFT HEART CATH AND CORONARY ANGIOGRAPHY N/A 02/20/2018   Procedure: RIGHT/LEFT HEART CATH AND CORONARY ANGIOGRAPHY;  Surgeon: [name], MD;  Location: [place] [facility];  Service: Cardiovascular;  Laterality: N/A;   TEE WITHOUT CARDIOVERSION N/A 07/08/2018   Procedure: TRANSESOPHAGEAL ECHOCARDIOGRAM (TEE);  Surgeon: Burnell Blanks, MD;  Location: [place];  Service: Open Heart Surgery;  Laterality: N/A;   TEE WITHOUT CARDIOVERSION  10/07/2018   TEE WITHOUT CARDIOVERSION N/A 10/07/2018   Procedure: TRANSESOPHAGEAL ECHOCARDIOGRAM (TEE);  Surgeon: Jerline Pain, MD;  Location: [facility] END[facility]COPY;  Service: Cardiovascular;  Laterality: N/A;   TONSILLECTOMY     age 38   TRANSCATHETER AORTIC VALVE REPLACEMENT, TRANSFEMORAL N/A 07/08/2018   Procedure: TRANSCATHETER AORTIC VALVE REPLACEMENT, TRANSFEMORAL;  Surgeon: Burnell Blanks, MD;  Location: [place];  Service: Open Heart  Surgery;  Laterality: N/A;    Family History  Adopted: Yes  Problem Relation Age of Onset   Healthy Son        x 2   Headache Other        Cluster headaches   Heart failure Mother    Colon cancer Neg Hx    Pancreatic cancer Neg Hx    Rectal cancer Neg Hx    Stomach cancer Neg Hx     No Known Allergies  Current Outpatient Medications on File Prior to Visit  Medication Sig Dispense Refill   acetaminophen (TYLENOL) 500 MG tablet Take 500 mg by mouth every 6 (six) hours as needed for mild pain.     apixaban (ELIQUIS) 5 MG TABS tablet Take 1 tablet (5 mg total) by mouth 2 (two) times daily. 180 tablet 3   b complex vitamins capsule Take 1 capsule by mouth daily.     BD PEN NEEDLE NANO 2ND GEN 32G X 4 MM MISC      Blood Glucose Monitoring Suppl (ONETOUCH VERIO REFLECT) w/Device KIT Use as directed three times daily 1 kit 0   cholecalciferol (VITAMIN D) 25 MCG (1000 UNIT) tablet Take 1,000 Units by mouth daily.     Continuous Blood Gluc Receiver (FREESTYLE LIBRE READER) DEVI Use as directed 1 each 0   Continuous Blood Gluc Sensor (FREESTYLE LIBRE 3 SENSOR) MISC Place one sensor every 14 days 2 each 11   diltiazem (CARDIZEM CD) 120 MG 24 hr capsule Take 1 capsule (120 mg total) by mouth daily. 90 capsule 3   escitalopram (LEXAPRO) 20 MG tablet TAKE ONE TABLET BY MOUTH DAILY 90 tablet 1   Evolocumab (REPATHA SURECLICK) [id number] MG/ML SOAJ Inject 1 dose into the skin every 14 (fourteen) days. 6 mL 3   glucose blood (ONETOUCH VERIO) test strip USE TO CHECK BLOOD SUGAR THREE TIMES DAILY 300 strip 4   influenza vaccine adjuvanted (FLUAD) 0.5 ML injection Inject into the muscle. 0.5 mL 0   insulin NPH Human (HUMULIN N) 100 UNIT/ML injection Inject 20 units under the skin in the morning and 14 units in evening 20 mL 5   Insulin NPH, Human,, Isophane, (HUMULIN N KWIKPEN) 100 UNIT/ML Kiwkpen Inject 20 units into the skin every morning and 14 units every evening. (Patient taking differently: Inject 14-20  Units into  the skin 2 (two) times daily.) 15 mL 6   Insulin Pen Needle (UNIFINE PENTIPS) 32G X 4 MM MISC Inject 4 times subcutaneously 400 each 2   insulin regular (HUMULIN R) 100 units/mL injection Inject 10 Units total into the skin 3 (three) times daily before meals. 10 mL 5   Insulin Regular Human (NOVOLIN R FLEXPEN RELION) 100 UNIT/ML KwikPen Inject 5 to 10 units under the skin three times daily. 15 mL 6   Insulin Syringe-Needle U-100 (ULTICARE INSULIN SYRINGE) 31G X 5/16" 1 ML MISC Inject 4 times daily subcutaneously 100 each 1   Lancets (ONETOUCH DELICA PLUS [id number]) MISC Use twice daily 200 each 1   levothyroxine (SYNTHROID) 137 MCG tablet Take 1 tablet by mouth every morning on an empty stomach  BRAND NAME ONLY 90 tablet 1   losartan (COZAAR) 50 MG tablet TAKE ONE TABLET BY MOUTH DAILY (Patient taking differently: Take 50 mg by mouth daily.) 90 tablet 3   metFORMIN (GLUCOPHAGE-XR) 500 MG 24 hr tablet Take 1 tablet by mouth 2 times daily 180 tablet 2   Multiple Vitamin (MULITIVITAMIN WITH MINERALS) TABS Take 1 tablet by mouth daily.     nitroGLYCERIN (NITROSTAT) 0.4 MG SL tablet Place 1 tablet (0.4 mg total) under the tongue every 5 (five) minutes as needed for chest pain. 25 tablet 4   omeprazole (PRILOSEC) 20 MG capsule Take 1 capsule (20 mg total) by mouth daily. 90 capsule 1   oxyCODONE-acetaminophen (PERCOCET) 5-325 MG tablet Take 1 tablet by mouth every 6 (six) hours. 60 tablet 0   potassium chloride SA (KLOR-CON M) 20 MEQ tablet Take 1 tablet (20 mEq total) by mouth twice daily for 3 days only, then decrease to taking 1 tablet (20 mEq total) by mouth daily thereafter.  Take in concurrence with torsemide. 40 tablet 0   pregabalin (LYRICA) 75 MG capsule Take 1 capsule (75 mg total) by mouth 2 (two) times daily. 60 capsule 0   torsemide (DEMADEX) 20 MG tablet Take 1 tablet (20 mg total) by mouth twice daily for 3 days only, then decrease to taking 1 tablet (20 mg total) by mouth daily  thereafter. 40 tablet 0   No current facility-administered medications on file prior to visit.    BP 110/60   Pulse 76   Temp 97.6 F (36.4 C) (Oral)   SpO2 100%       Objective:   Physical Exam Vitals and nursing note reviewed.  Constitutional:      Appearance: Normal appearance.  Cardiovascular:     Pulses: Normal pulses.     Heart sounds: Normal heart sounds. No murmur heard.    No friction rub.  Pulmonary:     Effort: Pulmonary effort is normal.     Breath sounds: Normal breath sounds. No stridor. No wheezing, rhonchi or rales.  Musculoskeletal:     Right lower leg: 2+ Pitting Edema present.     Left lower leg: 1+ Pitting Edema present.  Neurological:     Mental Status: She is alert.  Psychiatric:        Mood and Affect: Mood normal.        Behavior: Behavior normal.        Thought Content: Thought content normal.        Judgment: Judgment normal.        Assessment & Plan:  1. Abnormal lung sounds -Clear to auscultation throughout all quadrants today in the office.  2. Lower extremity edema - Follow up  with Cardiology as directed.   [name], NP   Time spent with patient today was 32 minutes which consisted of chart review, discussing diagnosis, work up, treatment answering questions and documentation.

## 2022-07-10 NOTE — Patient Instructions (Signed)
Medication Instructions:    START TAKING : SPRINOLACTONE 12.5  MG ONCE A DAY    START TAKING :  LOSARTAN 25 MG ONCE  A DAY    *If you need a refill on your cardiac medications before your next appointment, please call your pharmacy*   Lab Work:  RETURN IN 2 WEEKS FOR LABS: BMET MAG AND CBC   If you have labs (blood work) drawn today and your tests are completely normal, you will receive your results only by: [place] (if you have MyChart) OR A paper copy in the mail If you have any lab test that is abnormal or we need to change your treatment, we will call you to review the results.   Testing/Procedures:  Your physician has requested that you have an echocardiogram MOVED UP EARLIER THAN APRIL DUE TO SYMPTOMS.  Echocardiography is a painless test that uses sound waves to create images of your heart. It provides your doctor with information about the size and shape of your heart and how well your heart's chambers and valves are working. This procedure takes approximately one hour. There are no restrictions for this procedure. Please do NOT wear cologne, perfume, aftershave, or lotions (deodorant is allowed). Please arrive 15 minutes prior to your appointment time.   Follow-Up: At [facility], you and your health needs are our priority.  As part of our continuing mission to provide you with exceptional heart care, we have created designated Provider Care Teams.  These Care Teams include your primary Cardiologist (physician) and Advanced Practice Providers (APPs -  Physician Assistants and Nurse Practitioners) who all work together to provide you with the care you need, when you need it.  We recommend signing up for the patient portal called "MyChart".  Sign up information is provided on this After Visit Summary.  MyChart is used to connect with patients for Virtual Visits (Telemedicine).  Patients are able to view lab/test results, encounter notes, upcoming appointments,  etc.  Non-urgent messages can be sent to your provider as well.   To learn more about what you can do with MyChart, go to [email or web address].    Your next appointment:   4 week(s)  Provider:  Johney Frame / [facility]    Other Instructions  PLEASE INVEST IN A BLOOD PRESSURE CUFF TO MONITOR PRESSURE AT HOME. MAKE SURE MEDICINES HAVE BEEN TOOKEN HOUR

## 2022-07-11 ENCOUNTER — Other Ambulatory Visit (HOSPITAL_BASED_OUTPATIENT_CLINIC_OR_DEPARTMENT_OTHER): Payer: Self-pay

## 2022-07-12 DIAGNOSIS — I2510 Atherosclerotic heart disease of native coronary artery without angina pectoris: Secondary | ICD-10-CM | POA: Diagnosis not present

## 2022-07-12 DIAGNOSIS — Z471 Aftercare following joint replacement surgery: Secondary | ICD-10-CM | POA: Diagnosis not present

## 2022-07-12 DIAGNOSIS — I509 Heart failure, unspecified: Secondary | ICD-10-CM | POA: Diagnosis not present

## 2022-07-12 DIAGNOSIS — G20A1 Parkinson's disease without dyskinesia, without mention of fluctuations: Secondary | ICD-10-CM | POA: Diagnosis not present

## 2022-07-12 DIAGNOSIS — R011 Cardiac murmur, unspecified: Secondary | ICD-10-CM | POA: Diagnosis not present

## 2022-07-12 DIAGNOSIS — I110 Hypertensive heart disease with heart failure: Secondary | ICD-10-CM | POA: Diagnosis not present

## 2022-07-13 ENCOUNTER — Ambulatory Visit: Payer: Medicare Other | Admitting: Internal Medicine

## 2022-07-13 DIAGNOSIS — I509 Heart failure, unspecified: Secondary | ICD-10-CM | POA: Diagnosis not present

## 2022-07-13 DIAGNOSIS — R011 Cardiac murmur, unspecified: Secondary | ICD-10-CM | POA: Diagnosis not present

## 2022-07-13 DIAGNOSIS — I110 Hypertensive heart disease with heart failure: Secondary | ICD-10-CM | POA: Diagnosis not present

## 2022-07-13 DIAGNOSIS — Z471 Aftercare following joint replacement surgery: Secondary | ICD-10-CM | POA: Diagnosis not present

## 2022-07-13 DIAGNOSIS — I2510 Atherosclerotic heart disease of native coronary artery without angina pectoris: Secondary | ICD-10-CM | POA: Diagnosis not present

## 2022-07-13 DIAGNOSIS — G20A1 Parkinson's disease without dyskinesia, without mention of fluctuations: Secondary | ICD-10-CM | POA: Diagnosis not present

## 2022-07-17 ENCOUNTER — Ambulatory Visit (HOSPITAL_COMMUNITY)
Admission: RE | Admit: 2022-07-17 | Discharge: 2022-07-17 | Disposition: A | Discharge status: Home / Self Care | Payer: Medicare Other | Source: Ambulatory Visit | Attending: Cardiology | Admitting: Cardiology

## 2022-07-17 ENCOUNTER — Other Ambulatory Visit (HOSPITAL_BASED_OUTPATIENT_CLINIC_OR_DEPARTMENT_OTHER): Payer: Self-pay

## 2022-07-17 DIAGNOSIS — I5033 Acute on chronic diastolic (congestive) heart failure: Secondary | ICD-10-CM | POA: Diagnosis not present

## 2022-07-17 DIAGNOSIS — I2510 Atherosclerotic heart disease of native coronary artery without angina pectoris: Secondary | ICD-10-CM | POA: Diagnosis not present

## 2022-07-17 DIAGNOSIS — I509 Heart failure, unspecified: Secondary | ICD-10-CM | POA: Diagnosis not present

## 2022-07-17 DIAGNOSIS — E119 Type 2 diabetes mellitus without complications: Secondary | ICD-10-CM | POA: Insufficient documentation

## 2022-07-17 DIAGNOSIS — I3481 Nonrheumatic mitral (valve) annulus calcification: Secondary | ICD-10-CM | POA: Insufficient documentation

## 2022-07-17 DIAGNOSIS — Z79899 Other long term (current) drug therapy: Secondary | ICD-10-CM

## 2022-07-17 DIAGNOSIS — E785 Hyperlipidemia, unspecified: Secondary | ICD-10-CM | POA: Insufficient documentation

## 2022-07-17 DIAGNOSIS — Z952 Presence of prosthetic heart valve: Secondary | ICD-10-CM | POA: Diagnosis not present

## 2022-07-17 DIAGNOSIS — I110 Hypertensive heart disease with heart failure: Secondary | ICD-10-CM | POA: Insufficient documentation

## 2022-07-17 DIAGNOSIS — Z8673 Personal history of transient ischemic attack (TIA), and cerebral infarction without residual deficits: Secondary | ICD-10-CM | POA: Insufficient documentation

## 2022-07-17 DIAGNOSIS — K219 Gastro-esophageal reflux disease without esophagitis: Secondary | ICD-10-CM | POA: Insufficient documentation

## 2022-07-17 DIAGNOSIS — R011 Cardiac murmur, unspecified: Secondary | ICD-10-CM | POA: Diagnosis not present

## 2022-07-17 DIAGNOSIS — I5032 Chronic diastolic (congestive) heart failure: Secondary | ICD-10-CM | POA: Diagnosis not present

## 2022-07-17 DIAGNOSIS — Z471 Aftercare following joint replacement surgery: Secondary | ICD-10-CM | POA: Diagnosis not present

## 2022-07-17 DIAGNOSIS — G20A1 Parkinson's disease without dyskinesia, without mention of fluctuations: Secondary | ICD-10-CM | POA: Diagnosis not present

## 2022-07-17 LAB — ECHOCARDIOGRAM COMPLETE
AR max vel: 1.73 cm2
AV Area VTI: 1.68 cm2
AV Area mean vel: 1.6 cm2
AV Mean grad: 16.3 mmHg
AV Peak grad: 27.8 mmHg
Ao pk vel: 2.64 m/s
Area-P 1/2: 2.5 cm2
Calc EF: 55.6 %
MV VTI: 2.19 cm2
S' Lateral: 2.8 cm
Single Plane A2C EF: 57.6 %
Single Plane A4C EF: 54.9 %

## 2022-07-17 NOTE — Progress Notes (Signed)
Echocardiogram 2D Echocardiogram has been performed.  Fidel Levy 07/17/2022, 12:20 PM

## 2022-07-18 ENCOUNTER — Telehealth: Payer: Self-pay | Admitting: *Deleted

## 2022-07-18 ENCOUNTER — Other Ambulatory Visit (HOSPITAL_BASED_OUTPATIENT_CLINIC_OR_DEPARTMENT_OTHER): Payer: Self-pay

## 2022-07-18 DIAGNOSIS — I050 Rheumatic mitral stenosis: Secondary | ICD-10-CM

## 2022-07-18 DIAGNOSIS — Z471 Aftercare following joint replacement surgery: Secondary | ICD-10-CM | POA: Diagnosis not present

## 2022-07-18 DIAGNOSIS — I509 Heart failure, unspecified: Secondary | ICD-10-CM | POA: Diagnosis not present

## 2022-07-18 DIAGNOSIS — Z952 Presence of prosthetic heart valve: Secondary | ICD-10-CM

## 2022-07-18 DIAGNOSIS — G20A1 Parkinson's disease without dyskinesia, without mention of fluctuations: Secondary | ICD-10-CM | POA: Diagnosis not present

## 2022-07-18 DIAGNOSIS — I110 Hypertensive heart disease with heart failure: Secondary | ICD-10-CM | POA: Diagnosis not present

## 2022-07-18 DIAGNOSIS — I2510 Atherosclerotic heart disease of native coronary artery without angina pectoris: Secondary | ICD-10-CM | POA: Diagnosis not present

## 2022-07-18 DIAGNOSIS — R011 Cardiac murmur, unspecified: Secondary | ICD-10-CM | POA: Diagnosis not present

## 2022-07-18 NOTE — Telephone Encounter (Signed)
The patients husband [name] (on [place]) has been notified of the result and verbalized understanding.  All questions (if any) were answered.  Pts Husband [name] (on [place]) is aware that I will go ahead and place the order for repeat echo in one year in the system and have our Echo Scheduler give them a call back to arrange this appt for that time.  [name] verbalized understanding and agrees with this plan.

## 2022-07-18 NOTE — Telephone Encounter (Signed)
-----   Message from Freada Bergeron, MD sent at 07/17/2022  8:20 PM EDT ----- Her echo looks stable with normal pumping function and a normal functioning aortic valve prosthesis. There is mild narrowing of her mitral valve which we will monitor. Overall, this is stable from prior and will continue with annual monitoring

## 2022-07-19 DIAGNOSIS — Z471 Aftercare following joint replacement surgery: Secondary | ICD-10-CM | POA: Diagnosis not present

## 2022-07-19 DIAGNOSIS — G20A1 Parkinson's disease without dyskinesia, without mention of fluctuations: Secondary | ICD-10-CM | POA: Diagnosis not present

## 2022-07-19 DIAGNOSIS — I110 Hypertensive heart disease with heart failure: Secondary | ICD-10-CM | POA: Diagnosis not present

## 2022-07-19 DIAGNOSIS — R011 Cardiac murmur, unspecified: Secondary | ICD-10-CM | POA: Diagnosis not present

## 2022-07-19 DIAGNOSIS — I2510 Atherosclerotic heart disease of native coronary artery without angina pectoris: Secondary | ICD-10-CM | POA: Diagnosis not present

## 2022-07-19 DIAGNOSIS — I509 Heart failure, unspecified: Secondary | ICD-10-CM | POA: Diagnosis not present

## 2022-07-20 DIAGNOSIS — I2510 Atherosclerotic heart disease of native coronary artery without angina pectoris: Secondary | ICD-10-CM | POA: Diagnosis not present

## 2022-07-20 DIAGNOSIS — M25561 Pain in right knee: Secondary | ICD-10-CM | POA: Diagnosis not present

## 2022-07-20 DIAGNOSIS — I509 Heart failure, unspecified: Secondary | ICD-10-CM | POA: Diagnosis not present

## 2022-07-20 DIAGNOSIS — R011 Cardiac murmur, unspecified: Secondary | ICD-10-CM | POA: Diagnosis not present

## 2022-07-20 DIAGNOSIS — G20A1 Parkinson's disease without dyskinesia, without mention of fluctuations: Secondary | ICD-10-CM | POA: Diagnosis not present

## 2022-07-20 DIAGNOSIS — I110 Hypertensive heart disease with heart failure: Secondary | ICD-10-CM | POA: Diagnosis not present

## 2022-07-20 DIAGNOSIS — Z471 Aftercare following joint replacement surgery: Secondary | ICD-10-CM | POA: Diagnosis not present

## 2022-07-24 ENCOUNTER — Ambulatory Visit: Payer: Medicare Other | Attending: Physician Assistant

## 2022-07-24 DIAGNOSIS — I5032 Chronic diastolic (congestive) heart failure: Secondary | ICD-10-CM

## 2022-07-24 DIAGNOSIS — R002 Palpitations: Secondary | ICD-10-CM | POA: Diagnosis not present

## 2022-07-25 LAB — BASIC METABOLIC PANEL
BUN/Creatinine Ratio: 26 (ref 12–28)
BUN: 21 mg/dL (ref 8–27)
CO2: 22 mmol/L (ref 20–29)
Calcium: 10.7 mg/dL — ABNORMAL HIGH (ref 8.7–10.3)
Chloride: 103 mmol/L (ref 96–106)
Creatinine, Ser: 0.8 mg/dL (ref 0.57–1.00)
Glucose: 154 mg/dL — ABNORMAL HIGH (ref 70–99)
Potassium: 4.2 mmol/L (ref 3.5–5.2)
Sodium: 142 mmol/L (ref 134–144)
eGFR: 75 mL/min/{1.73_m2} (ref >59)

## 2022-07-25 LAB — CBC
Hematocrit: 34.6 % (ref 34.0–46.6)
Hemoglobin: 10.4 g/dL — ABNORMAL LOW (ref 11.1–15.9)
MCH: 19.5 pg — ABNORMAL LOW (ref 26.6–33.0)
MCHC: 30.1 g/dL — ABNORMAL LOW (ref 31.5–35.7)
MCV: 65 fL — ABNORMAL LOW (ref 79–97)
Platelets: 211 10*3/uL (ref 150–450)
RBC: 5.33 x10E6/uL — ABNORMAL HIGH (ref 3.77–5.28)
RDW: 16.3 % — ABNORMAL HIGH (ref 11.7–15.4)
WBC: 5.6 10*3/uL (ref 3.4–10.8)

## 2022-07-25 LAB — MAGNESIUM: Magnesium: 1.8 mg/dL (ref 1.6–2.3)

## 2022-07-27 DIAGNOSIS — R011 Cardiac murmur, unspecified: Secondary | ICD-10-CM | POA: Diagnosis not present

## 2022-07-27 DIAGNOSIS — I2510 Atherosclerotic heart disease of native coronary artery without angina pectoris: Secondary | ICD-10-CM | POA: Diagnosis not present

## 2022-07-27 DIAGNOSIS — I110 Hypertensive heart disease with heart failure: Secondary | ICD-10-CM | POA: Diagnosis not present

## 2022-07-27 DIAGNOSIS — I509 Heart failure, unspecified: Secondary | ICD-10-CM | POA: Diagnosis not present

## 2022-07-27 DIAGNOSIS — Z471 Aftercare following joint replacement surgery: Secondary | ICD-10-CM | POA: Diagnosis not present

## 2022-07-27 DIAGNOSIS — G20A1 Parkinson's disease without dyskinesia, without mention of fluctuations: Secondary | ICD-10-CM | POA: Diagnosis not present

## 2022-07-31 ENCOUNTER — Encounter: Payer: Self-pay | Admitting: Neurology

## 2022-07-31 ENCOUNTER — Other Ambulatory Visit (HOSPITAL_BASED_OUTPATIENT_CLINIC_OR_DEPARTMENT_OTHER): Payer: Self-pay

## 2022-07-31 ENCOUNTER — Telehealth: Payer: Self-pay

## 2022-07-31 ENCOUNTER — Ambulatory Visit (INDEPENDENT_AMBULATORY_CARE_PROVIDER_SITE_OTHER): Payer: Medicare Other | Admitting: Neurology

## 2022-07-31 VITALS — BP 109/56 | HR 73 | Ht 65.0 in | Wt 178.0 lb

## 2022-07-31 DIAGNOSIS — E889 Metabolic disorder, unspecified: Secondary | ICD-10-CM | POA: Diagnosis not present

## 2022-07-31 DIAGNOSIS — I890 Lymphedema, not elsewhere classified: Secondary | ICD-10-CM | POA: Diagnosis not present

## 2022-07-31 DIAGNOSIS — G8194 Hemiplegia, unspecified affecting left nondominant side: Secondary | ICD-10-CM | POA: Diagnosis not present

## 2022-07-31 DIAGNOSIS — G214 Vascular parkinsonism: Secondary | ICD-10-CM | POA: Diagnosis not present

## 2022-07-31 DIAGNOSIS — Z794 Long term (current) use of insulin: Secondary | ICD-10-CM

## 2022-07-31 DIAGNOSIS — I4891 Unspecified atrial fibrillation: Secondary | ICD-10-CM

## 2022-07-31 DIAGNOSIS — I25118 Atherosclerotic heart disease of native coronary artery with other forms of angina pectoris: Secondary | ICD-10-CM

## 2022-07-31 DIAGNOSIS — G63 Polyneuropathy in diseases classified elsewhere: Secondary | ICD-10-CM

## 2022-07-31 DIAGNOSIS — E1169 Type 2 diabetes mellitus with other specified complication: Secondary | ICD-10-CM | POA: Diagnosis not present

## 2022-07-31 MED ORDER — PRIMIDONE 50 MG PO TABS
50.0000 mg | ORAL_TABLET | Freq: Every morning | ORAL | 5 refills | Status: DC
  Filled 2022-07-31: qty 30, 30d supply, fill #0

## 2022-07-31 NOTE — Patient Instructions (Addendum)
ASSESSMENT AND PLAN 77 y.o. year old female  here with:    1) Vascular parkinsonism. Failed sinemet, requip. Mirapex.  Will try primidone. Written for once a day AM to see if it has any effect on tremor.  I like for her to have a special occupational therapy for the left shoulder. She must to talk to her prescriber about pain treatments, she needs to be able to participate in shoulder therapy, Dr Ranell Patrick at [facility].  I am suspecting there is a component of lymphedema. Left hand swelling and painful to touch, severely restricted ROM left shoulder and arm.  2) Cc Dr Shari Prows, MD.   I plan to follow up either personally or through our NP within 3 months, follow up on new primidone 50 mg po tremor meds. We can use this medication in AM or bedtime, but not together with oxycodone. I don't expect interference with Eloquis at 50 mg dose.   I would like to thank Shirline Frees, N[address] [place],  [place] [address] for allowing me to meet with and to take care of this pleasant patient.   CC: I will share my notes with Dr Shari Prows .   Primidone Tablets What is this medication? PRIMIDONE (PRI mi done) prevents and controls seizures in people with epilepsy. It works by calming overactive nerves in your body. This medicine may be used for other purposes; ask your health care provider or pharmacist if you have questions. COMMON BRAND NAME(S): Mysoline What should I tell my care team before I take this medication? They need to know if you have any of these conditions: Kidney disease Liver disease Porphyria Suicidal thoughts, plans, or attempt by you or a family member An unusual or allergic reaction to primidone, phenobarbital, other barbiturates or seizure medications, other medications, foods, dyes, or preservatives Pregnant or trying to get pregnant Breast-feeding How should I use this medication? Take this medication by mouth with a glass of water. Follow the directions on  the prescription label. Take your doses at regular intervals. Do not take your medication more often than directed. Do not stop taking except on the advice of your care team. A special MedGuide will be given to you by the pharmacist with each prescription and refill. Be sure to read this information carefully each time. Contact your care team about the use of this medication in children. Special care may be needed. While this medication may be prescribed for children for selected conditions, precautions do apply. Overdosage: If you think you have taken too much of this medicine contact a poison control center or emergency room at once. NOTE: This medicine is only for you. Do not share this medicine with others. What if I miss a dose? If you miss a dose, take it as soon as you can. If it is almost time for your next dose, take only that dose. Do not take double or extra doses. What may interact with this medication? Do not take this medication with any of the following: Voriconazole This medication may also interact with the following: Cancer-treating medications Cyclosporine Disopyramide Doxycycline Estrogen or progestin hormones Medications for depression, anxiety or other mental health conditions Medications for treating HIV infection or AIDS Modafinil Prescription pain medications Quinidine Warfarin This list may not describe all possible interactions. Give your health care provider a list of all the medicines, herbs, non-prescription drugs, or dietary supplements you use. Also tell them if you smoke, drink alcohol, or use illegal drugs. Some items may  interact with your medicine. What should I watch for while using this medication? Visit your care team for regular checks on your progress. It may be 2 to 3 weeks before you see the full effects of this medication. Do not suddenly stop taking this medication, you may increase the risk of seizures. Your care team may want to gradually reduce  the dose. Wear a medical identification bracelet or chain to say you have epilepsy, and carry a card that lists all your medications. You may get drowsy or dizzy. Do not drive, use machinery, or do anything that needs mental alertness until you know how this medication affects you. Do not stand or sit up quickly, especially if you are an older patient. This reduces the risk of dizzy or fainting spells. Alcohol may interfere with the effect of this medication. Avoid alcoholic drinks. Birth control pills may not work properly while you are taking this medication. Talk to your care team about using an extra method of birth control. The use of this medication may increase the chance of suicidal thoughts or actions. Pay special attention to how you are responding while on this medication. Any worsening of mood, or thoughts of suicide or dying should be reported to your care team right away. Women who become pregnant while using this medication may enroll in the [place] American Antiepileptic Drug Pregnancy Registry by calling [phone]. This registry collects information about the safety of antiepileptic medication use during pregnancy. This medication may cause a decrease in vitamin D and folic acid. You should make sure that you get enough vitamins while you are taking this medication. Discuss the foods you eat and the vitamins you take with your care team. What side effects may I notice from receiving this medication? Side effects that you should report to your care team as soon as possible: Allergic reactions--skin rash, itching, hives, swelling of the face, lips, tongue, or throat CNS depression--slow or shallow breathing, shortness of breath, feeling faint, dizziness, confusion, trouble staying awake Thoughts of suicide or self-harm, worsening mood, or feelings of depression Side effects that usually do not require medical attention (report to your care team if they continue or are  bothersome): Dizziness Drowsiness Loss of balance or coordination Nausea This list may not describe all possible side effects. Call your doctor for medical advice about side effects. You may report side effects to FDA at 1-800-FDA-1088. Where should I keep my medication? Keep out of the reach of children and pets. This medication may cause accidental overdose and death if it is taken by other adults, children, or pets. Mix any unused medication with a substance like cat litter or coffee grounds. Then throw the medication away in a sealed container like a sealed bag or a coffee can with a lid. Do not use the medication after the expiration date. Store at room temperature between 15 and 30 degrees C (59 and 86 degrees F). NOTE: This sheet is a summary. It may not cover all possible information. If you have questions about this medicine, talk to your doctor, pharmacist, or health care provider.  2023 Elsevier/Gold Standard (2007-05-31 00:00:00)

## 2022-07-31 NOTE — Progress Notes (Addendum)
Provider:  Melvyn Novas, MD  Primary Care Physician:  Shirline Frees, NP [address] [place] [address]     Referring Provider: Shirline Frees, Np [address] [place],  [place] [address]          Chief Complaint according to patient   Patient presents with:     Patient (Initial Visit)     Breanna Webster      HISTORY OF PRESENT ILLNESS:  07-31-2022;  Breanna Webster is a 77 y.o. female patient who is here for revisit 07/31/2022 for  a Regular revisit  in secondarily parkinsonism.  Chief concern according to patient :  his spouse Breanna Webster Pt is well, reports she is here for further medication management regarding vascular parkinsonism. Her tremors have no improved, states her mouth is now quivering. She was failed by sinemet, requip and mirapex. She has shoulder pain, 9 weeks post replacement. Her bone results look good, but her left arm is puffier and stiff and trembles at rest. Her chin and mouth quivers, not her tongue. She is having a left facial droop and more masked expression. She takes oxycodone at night, she has an interest on exchanging it with phenobarbital.  She reports nocturia 1-2 times,but goes all day.   She had anterior tibial stimulation and it helped with the frequency.  I like to start primidone as a trial in AM- to see how the tremor may respond.      RV 10-25-2021, This 77 year -old  patient reports she became nauseated on carbi-levo dopa, used it TID and BID, in 2 different doses. and stopped 5 days ago,  After trying carbidopa levodopa.  We had tried to ease her and was able twice daily regimen but this was not tolerated either.  She had taken the medication with a cracker with water before the meal and she has still not seen on affect neither has her husband seen an effect on her left hand dominant tremor. We are meeting today to see if this is a vascular parkinsonism that could benefit from a dopaminergic agonist instead of  dopamine replacement therapy.  I would like to reiterate that this patient had multifocal bihemispheric cardioembolic strokes after new onset A-fib related to an endocarditis. I will now place her on Ropinorol.  She reports her left leg feels heavy and clumsy. Her balance has improved. Her resting tremor happened almost suddenly, and I suspected a vascular onset .  Occupation therapy documented improvement but she is weaker in the left hand, less pinch strength and slower.         Rv: 06-21-2021: Rv with Melvyn Novas, MD  atrial fibrillation, tremor.  [name] is a 77 y.o. year old female presented with increased shortness of breath and confusion x2 weeks along with headache on 10/21/2018 with recent admission on 10/03/2018 for sepsis and mitral valve endocarditis.  Stroke work-up revealed multifocal bilateral anterior and posterior infarcts cardioembolic pattern secondary to new onset A. fib versus recent endocarditis. Vascular risk factors include history of DVT, aortic stenosis status post TAVR 06/2018, pulmonary hypertension, HTN, DM, CAD status post stenting 03/2018, endocarditis, and A. Fib on Eliquis. Prior dx of sleep apnea but on recent sleep study no evidence of sleep apnea She has seen ophthalmologist, Dr. Dione Booze, and he felt strongly that her vision changes were due to stroke.  She is on Eloquis. Transient right eye vision loss, attributed to CVA, atrial  fibrillation. She never felt any symptoms such as palpitations. Amaurosis fugax, 2 minutes, resolved- diplopia resolved within seconds of onset.   Plans on following up with Dr. Dione BoozeGroat for further evaluation including visual field testing.  Left hand tremor, worsening: this was present since prior stroke. Its larger amplitude. It's left side only- and looks very parkinsonian.  Cerebral angiogram was discussed with Dr.Groat. Breanna Webster is her cardiologist.    10/21/2018 with recent admission on 10/03/2018 for sepsis and mitral valve  endocarditis.  Stroke work-up revealed multifocal bilateral anterior and posterior infarcts cardioembolic pattern secondary to new onset A. fib versus recent endocarditis. Vascular risk factors include history of DVT, aortic stenosis status post TAVR 06/2018, pulmonary hypertension, HTN, DM, CAD status post stenting 03/2018, endocarditis, and A. Fib on Eliquis. Prior dx of sleep apnea but on recent sleep study no evidence of sleep apnea       Update 04/13/2021 Breanna CzarJessica Webster- Stroke  Patient returns for acute visit due to increased falls and multiple other concerns.  Falls:  ED eval 12/14 post fall with c/o left flank pain and HA. CTH no acute finding. Lab work showed stable chronic anemia and hyperglycemia with improvement after D50. Fall occurred after "tripping over her feet" per husband. Per ED note, left orbital and left flank bruising. CT abdomen unremarkable. Reports fall 2 weeks ago where she lost her balance -felt like she was being pulled towards the left.  Denies associated dizziness at that time but does have chronic intermittent dizziness. Imbalance present since her stroke. Does admit to limited physical activity or exercise due to fear of falling - she reports she keeps busy going to different doctors appointments. She does report chronic b/l knee and lower back pain.  Chronic imbalance from stroke 2.5 years ago. She is concerned that this has worsened over the past 2 weeks and very concerned about possible new stroke. Denies any increased stressors or new medications.       Review of Systems: Out of a complete 14 system review, the patient complains of only the following symptoms, and all other reviewed systems are negative.:  Nocturia    Social History   Socioeconomic History   Marital status: Married    Spouse name: Not on file   Number of children: 2   Years of education: Not on file   Highest education level: Not on file  Occupational History   Occupation: Health and safety inspectoretired-Director  of the [employer]  Tobacco Use   Smoking status: Never    Passive exposure: Never   Smokeless tobacco: Never  Vaping Use   Vaping Use: Never used  Substance and Sexual Activity   Alcohol use: No    Alcohol/week: 0.0 standard drinks of alcohol   Drug use: Never   Sexual activity: Not Currently  Other Topics Concern   Not on file  Social History Narrative   Lives with husband in a one story home.     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  Regional Director of the [employer].   Education: college.   Social Determinants of Health   Financial Resource Strain: Medium Risk (11/13/2021)   Overall Financial Resource Strain (CARDIA)    Difficulty of Paying Living Expenses: Somewhat hard  Food Insecurity: No Food Insecurity (06/01/2022)   Hunger Vital Sign    Worried About Running Out of Food in the Last Year: Never true    Ran Out of Food in the Last Year: Never true  Transportation Needs: No Transportation Needs (06/01/2022)   PRAPARE - Administrator, Civil Service (Medical): No    Lack of Transportation (Non-Medical): No  Physical Activity: Inactive (04/03/2021)   Exercise Vital Sign    Days of Exercise per Week: 0 days    Minutes of Exercise per Session: 0 min  Stress: No Stress Concern Present (04/03/2021)   [employer] of Occupational Health - Occupational Stress Questionnaire    Feeling of Stress : Not at all  Social Connections: Socially Integrated (04/03/2021)   Social Connection and Isolation Panel [NHANES]    Frequency of Communication with Friends and Family: More than three times a week    Frequency of Social Gatherings with Friends and Family: More than three times a week    Attends Religious Services: More than 4 times per year    Active Member of [employer] or Organizations: Yes    Attends Engineer, structural: More than 4 times per year    Marital Status: Married    Family History  Adopted: Yes   Problem Relation Age of Onset   Healthy Son        x 2   Headache Other        Cluster headaches   Heart failure Mother    Colon cancer Neg Hx    Pancreatic cancer Neg Hx    Rectal cancer Neg Hx    Stomach cancer Neg Hx     Past Medical History:  Diagnosis Date   Anxiety    Arthritis    "back" (04/22/2018)   Back pain    Blood transfusion without reported diagnosis    CAD (coronary artery disease)    a. 03/2018 s/p PCI/DES to the RCA (3.0x15 Onyx DES).   Carotid artery stenosis    Mild   Chest pain    CHF (congestive heart failure)    Chronic lower back pain    Cirrhosis    Colon polyps    Diverticulitis    Diverticulosis    Esophageal thickening    seen on pre TAVR CT scan, also questionable cirrhosis. MRI recommended. Will refer to GI   GERD (gastroesophageal reflux disease)    Grave's disease    Heart murmur    History of colonic polyps 05/22/2017   History of hiatal hernia    Hyperlipidemia    Hypertension    Hypothyroidism    IBS (irritable bowel syndrome)    Osteopenia    Parkinson's syndrome 04/23/2020   Pulmonary nodules    seen on pre TAVR CT. likley benign. no follow up recommended if pt low risk.   S/P TAVR (transcatheter aortic valve replacement)    Shortness of breath on exertion    Stroke 2021   x2   Thalassemia minor    Thyroid disease    Type II diabetes mellitus     Past Surgical History:  Procedure Laterality Date   24 HOUR PH STUDY N/A 03/03/2018   Procedure: 24 HOUR PH STUDY;  Surgeon: Napoleon Form, MD;  Location: [facility] END[facility]COPY;  Service: Endoscopy;  Laterality: N/A;   COLONOSCOPY  2023   COLONOSCOPY W/ BIOPSIES AND POLYPECTOMY     CORONARY ANGIOGRAPHY Right 04/21/2018   Procedure: CORONARY ANGIOGRAPHY (CATH LAB);  Surgeon: Lyn Records, MD;  Location: [facility] INVASIVE CV LAB;  Service: Cardiovascular;  Laterality: Right;   CORONARY STENT INTERVENTION N/A 04/22/2018   Procedure: CORONARY STENT INTERVENTION;  Surgeon: Lyn Records, MD;  Location: [facility]  INVASIVE CV LAB;  Service: Cardiovascular;  Laterality: N/A;   DILATION AND CURETTAGE OF UTERUS     ESOPHAGEAL MANOMETRY N/A 03/03/2018   Procedure: ESOPHAGEAL MANOMETRY (EM);  Surgeon: Napoleon Form, MD;  Location: [facility] ENDOS[facility];  Service: Endoscopy;  Laterality: N/A;   HYSTEROSCOPY     fibroids   LAPAROSCOPIC CHOLECYSTECTOMY  1985   LAPAROSCOPY     fibroids   NISSEN FUNDOPLICATION  1990s   POLYPECTOMY     REVERSE SHOULDER ARTHROPLASTY Left 06/01/2022   Procedure: REVERSE SHOULDER ARTHROPLASTY;  Surgeon: Beverely Low, MD;  Location: [facility] ORS;  Service: Orthopedics;  Laterality: Left;  choice with interscalene block   RIGHT/LEFT HEART CATH AND CORONARY ANGIOGRAPHY N/A 02/20/2018   Procedure: RIGHT/LEFT HEART CATH AND CORONARY ANGIOGRAPHY;  Surgeon: Lyn Records, MD;  Location: [facility] INVASIVE CV LAB;  Service: Cardiovascular;  Laterality: N/A;   TEE WITHOUT CARDIOVERSION N/A 07/08/2018   Procedure: TRANSESOPHAGEAL ECHOCARDIOGRAM (TEE);  Surgeon: Kathleene Hazel, MD;  Location: [facility] OR;  Service: Open Heart Surgery;  Laterality: N/A;   TEE WITHOUT CARDIOVERSION  10/07/2018   TEE WITHOUT CARDIOVERSION N/A 10/07/2018   Procedure: TRANSESOPHAGEAL ECHOCARDIOGRAM (TEE);  Surgeon: Jake Bathe, MD;  Location: [facility] ENDOSCOPY;  Service: Cardiovascular;  Laterality: N/A;   TONSILLECTOMY     age 22   TRANSCATHETER AORTIC VALVE REPLACEMENT, TRANSFEMORAL N/A 07/08/2018   Procedure: TRANSCATHETER AORTIC VALVE REPLACEMENT, TRANSFEMORAL;  Surgeon: Kathleene Hazel, MD;  Location: [facility] OR;  Service: Open Heart Surgery;  Laterality: N/A;     Current Outpatient Medications on File Prior to Visit  Medication Sig Dispense Refill   acetaminophen (TYLENOL) 500 MG tablet Take 500 mg by mouth every 6 (six) hours as needed for mild pain.     apixaban (ELIQUIS) 5 MG TABS tablet Take 1 tablet (5 mg total) by mouth 2 (two) times daily. 180 tablet 3   b complex vitamins capsule Take 1  capsule by mouth daily.     BD PEN NEEDLE NANO 2ND GEN 32G X 4 MM MISC      cholecalciferol (VITAMIN D) 25 MCG (1000 UNIT) tablet Take 1,000 Units by mouth daily.     Continuous Blood Gluc Receiver (FREESTYLE LIBRE READER) DEVI Use as directed 1 each 0   Continuous Blood Gluc Sensor (FREESTYLE LIBRE 3 SENSOR) MISC Place one sensor every 14 days 2 each 11   diltiazem (CARDIZEM CD) 120 MG 24 hr capsule Take 1 capsule (120 mg total) by mouth daily. 90 capsule 3   escitalopram (LEXAPRO) 20 MG tablet TAKE ONE TABLET BY MOUTH DAILY 90 tablet 1   Evolocumab (REPATHA SURECLICK) 140 MG/ML SOAJ Inject 1 dose into the skin every 14 (fourteen) days. 6 mL 3   insulin NPH Human (HUMULIN N) 100 UNIT/ML injection Inject 20 units under the skin in the morning and 14 units in evening 20 mL 5   Insulin NPH, Human,, Isophane, (HUMULIN N KWIKPEN) 100 UNIT/ML Kiwkpen Inject 20 units into the skin every morning and 14 units every evening. 15 mL 6   Insulin Pen Needle (UNIFINE PENTIPS) 32G X 4 MM MISC Inject 4 times subcutaneously 400 each 2   insulin regular (HUMULIN R) 100 units/mL injection Inject 10 Units total into the skin 3 (three) times daily before meals. 10 mL 5   Insulin Regular Human (NOVOLIN R FLEXPEN RELION) 100 UNIT/ML KwikPen Inject 5 to 10 units under the skin three times daily. 15 mL 6   Insulin Syringe-Needle U-100 (ULTICARE INSULIN SYRINGE)  31G X 5/16" 1 ML MISC Inject 4 times daily subcutaneously 100 each 1   levothyroxine (SYNTHROID) 137 MCG tablet Take 1 tablet by mouth every morning on an empty stomach  BRAND NAME ONLY 90 tablet 1   losartan (COZAAR) 25 MG tablet Take 1 tablet (25 mg total) by mouth daily. 90 tablet 3   metFORMIN (GLUCOPHAGE-XR) 500 MG 24 hr tablet Take 1 tablet by mouth 2 times daily 180 tablet 2   Multiple Vitamin (MULITIVITAMIN WITH MINERALS) TABS Take 1 tablet by mouth daily.     nitroGLYCERIN (NITROSTAT) 0.4 MG SL tablet Place 1 tablet (0.4 mg total) under the tongue every 5  (five) minutes as needed for chest pain. 25 tablet 4   omeprazole (PRILOSEC) 20 MG capsule Take 1 capsule (20 mg total) by mouth daily. 90 capsule 1   oxyCODONE-acetaminophen (PERCOCET) 5-325 MG tablet Take 1 tablet by mouth every 6 (six) hours. 60 tablet 0   potassium chloride SA (KLOR-CON M) 20 MEQ tablet Take 1 tablet (20 mEq total) by mouth twice daily for 3 days only, then decrease to taking 1 tablet (20 mEq total) by mouth daily thereafter.  Take in concurrence with torsemide. 40 tablet 0   pregabalin (LYRICA) 75 MG capsule Take 1 capsule (75 mg total) by mouth 2 (two) times daily. 60 capsule 0   spironolactone (ALDACTONE) 25 MG tablet Take 0.5 tablets (12.5 mg total) by mouth daily. 135 tablet 3   torsemide (DEMADEX) 20 MG tablet Take 1 tablet (20 mg total) by mouth twice daily for 3 days only, then decrease to taking 1 tablet (20 mg total) by mouth daily thereafter. 40 tablet 0   No current facility-administered medications on file prior to visit.    No Known Allergies   DIAGNOSTIC DATA (LABS, IMAGING, TESTING) - I reviewed patient records, labs, notes, testing and imaging myself where available.  Lab Results  Component Value Date   WBC 5.6 07/24/2022   HGB 10.4 (L) 07/24/2022   HCT 34.6 07/24/2022   MCV 65 (L) 07/24/2022   PLT 211 07/24/2022      Component Value Date/Time   NA 142 07/24/2022 1215   K 4.2 07/24/2022 1215   CL 103 07/24/2022 1215   CO2 22 07/24/2022 1215   GLUCOSE 154 (H) 07/24/2022 1215   GLUCOSE 188 (H) 06/02/2022 0335   GLUCOSE 135 09/08/2008 0000   BUN 21 07/24/2022 1215   CREATININE 0.80 07/24/2022 1215   CREATININE 0.87 02/26/2022 1037   CALCIUM 10.7 (H) 07/24/2022 1215   PROT 6.6 05/16/2022 1200   PROT 6.9 02/26/2017 1116   ALBUMIN 3.7 05/16/2022 1200   ALBUMIN 3.9 02/26/2017 1116   AST 29 05/16/2022 1200   AST 27 02/26/2022 1037   ALT 28 05/16/2022 1200   ALT 32 02/26/2022 1037   ALKPHOS 71 05/16/2022 1200   BILITOT 1.0 05/16/2022 1200    BILITOT 1.2 02/26/2022 1037   GFRNONAA >60 06/02/2022 0335   GFRNONAA >60 02/26/2022 1037   GFRAA 99 12/05/2020 0000   GFRAA >60 08/14/2019 1025   Lab Results  Component Value Date   CHOL 106 09/06/2021   HDL 50 09/06/2021   LDLCALC 36 09/06/2021   LDLDIRECT 98.0 05/24/2014   TRIG 101 09/06/2021   CHOLHDL 2.1 09/06/2021   Lab Results  Component Value Date   HGBA1C 5.9 (H) 05/16/2022   Lab Results  Component Value Date   VITAMINB12 575 04/30/2019   Lab Results  Component Value Date  TSH 1.44 12/05/2020    PHYSICAL EXAM:  Today's Vitals   07/31/22 1438  BP: (!) 109/56  Pulse: 73  Weight: 178 lb (80.7 kg)  Height: 5\' 5"  (1.651 m)   Body mass index is 29.62 kg/m.   Wt Readings from Last 3 Encounters:  07/31/22 178 lb (80.7 kg)  07/10/22 170 lb (77.1 kg)  06/26/22 172 lb (78 kg)     Ht Readings from Last 3 Encounters:  07/31/22 5\' 5"  (1.651 m)  07/10/22 5\' 5"  (1.651 m)  06/26/22 5\' 5"  (1.651 m)      General: well developed, well nourished, pleasant elderly Caucasian female, seated, mildly anxious Head: head normocephalic and atraumatic.   Neck: supple with no carotid or supraclavicular bruits Cardiovascular: regular rate and rhythm, no murmurs Musculoskeletal: no deformity; pain with pressure over left lateral rib area Skin:  no rash/petichiae; ecchymosis left orbital and flank area Vascular:  Normal pulses all extremities   Neurologic Exam Mental Status: Awake and fully alert. Fluent speech and language. Oriented to place and time. Recent memory mildly impaired and remote memory intact.  Attention span, concentration and fund of knowledge mostly appropriate. Mood and affect appropriate.  Cranial Nerves: Pupils equal, briskly reactive to light. Extraocular movements full without nystagmus although mild OS convergence insufficiency.  Visual fields full to confrontation. Hearing intact. Facial sensation intact.  Slightly masked face.   Mild left lower facial  weakness - chronic  Motor: Normal bulk and tone.  Left arm and hand swelling- ROM limitation due to severe pain, had shoulder replacement.  Full strength in all tested extremities except slight decreased left hand dexterity and mild tremor with outstretched arm Sensory.: intact to touch, pinprick , position and vibratory sensation.  Coordination:tremor at rest , large amplitude when excited or agitated.  Finger-to-nose showed severe left hand ataxia . Gait and Station: wheelchair seated today  Reflexes: 1+ and symmetric. Toes downgoing.       ASSESSMENT AND PLAN 77 y.o. year old female  here with:    1) vascular parkinsonism. Failed sinemet, requip. Mirapex.  Will try primidone . Written for once a day AM to see if it has any effect on tremor.  I like for her to have a special occupational therapy for the left shoulder. She must to talk to her prescriber about  pain treatments, she needs to be able to participate in  shoulder therapy, Dr Ranell Patrick at [facility].  I am suspecting there is a component of lymphedema. Left hand swelling and painful to touch, severely restricted ROM left shoulder and arm.  2) Cc Dr Shari Prows, MD.   I plan to follow up either personally or through our NP within 3 months, follow up on tremor meds. .   I would like to thank Shirline Frees, NP and Shirline Frees, Np [address] [place],  [place] [address] for allowing me to meet with and to take care of this pleasant patient.   CC: I will share my notes with Dr Shari Prows .  After spending a total time of  40  minutes face to face and additional time for physical and neurologic examination, review of laboratory studies,  personal review of imaging studies, reports and results of other testing and review of referral information / records as far as provided in visit,   Electronically signed by: Melvyn Novas, MD 07/31/2022 2:52 PM  [facility] Board certified by The  [employer] of Sleep Medicine and Diplomate of  the American Academy of Sleep Medicine. Board certified In Neurology through the ABPN, Fellow of the [employer] of Neu[employer]. Medical Director of [facility].

## 2022-07-31 NOTE — Progress Notes (Signed)
Patient ID: Breanna Webster, female   DOB: [birth year], 77 y.o.   MRN: [id number]  Care Management & Coordination Services Pharmacy Team  Reason for Encounter: Diabetes  Contacted patient to discuss diabetes disease state. Unsuccessful outreach. Left voicemail for patient to return call.  Current antihyperglycemic regimen:  Humulin N 20 units in morning and 14 units at night  Humulin R 10 units in morning and at night  Novolin R 5-10 units 3 times daily (Kwikpen) Humulin N 20 units in morning and 14 units at night Stephanie Coup) Metformin XR 500 mg 1 tablet twice daily    Chart Updates:  Recent office visits:  07/10/22 Shirline Frees, NP - Patient presented for Abnormal lung sounds and other concerns. Stopped Tramadol . Stopped Vibegron.   Recent consult visits:  08/15/22 Dennie Maizes - Patient presented for Type 2 diabetes mellitus with hyperglycemia and other concerns. No medication changes.   07/31/22 Dohmeier, Porfirio Mylar, MD (Neurology) - Patient presented for Secondary Vascular Parkinson disease and other concerns. Started Promidone  08/10/22 Sharlene Dory, PA-C (Cardiology) - Patient presented for Coronary Artery disease involving native coronary artery of native heart without angina pectoris and other concerns. Stopped Losartan.  07/17/22 Patient presented to [facility] for Echocardiogram. No medication changes.   07/10/22 Sharlene Dory, PA-C (Cardiology) - Patient presented for Coronary Artery disease involving native coronary artery of native heart without angina pectoris and other concerns. Prescribed Spironolactone.   07/02/22 Patient presented to [facility] for LE Venous DVT.  06/26/22 Meriam Sprague, MD (Cardiology) - Patient presented for S/P TAVR and other concerns.  Prescribed Torsemide . Changed Potassium Chloride. Stopped Furosemide. Stopped Meclizine. Stopped Methocarbamol. Stopped Zofran. Stopped Mirapex.  05/14/22 Arnaldo Natal, NP Laurette Schimke) - Patient presented for GERD without esophagitis and other concerns. No medication changes. Recommended Imodium.  Hospital visits:  Medication Reconciliation was completed by comparing discharge summary, patient's EMR and Pharmacy list, and upon discussion with patient.  Patient presented to [facility] on 06/01/22 due to H/O total shoulder replacement left. Patient was present for 2 days.  New?Medications Started at [facility] Discharge:?? -started  traMADol (Ultram)  Medication Changes at Hospital Discharge: -Changed  methocarbamol (ROBAXIN)  Medications Discontinued at Hospital Discharge: -Stopped amoxicillin-clavulanate 875-125 MG tablet (AUGMENTIN) fluconazole 150 MG tablet  Medications that remain the same after Hospital Discharge:??  -All other medications will remain the same.    Medications: Outpatient Encounter Medications as of 07/31/2022  Medication Sig   acetaminophen (TYLENOL) 500 MG tablet Take 500 mg by mouth every 6 (six) hours as needed for mild pain.   apixaban (ELIQUIS) 5 MG TABS tablet Take 1 tablet (5 mg total) by mouth 2 (two) times daily.   b complex vitamins capsule Take 1 capsule by mouth daily.   BD PEN NEEDLE NANO 2ND GEN 32G X 4 MM MISC    Blood Glucose Monitoring Suppl (ONETOUCH VERIO REFLECT) w/Device KIT Use as directed three times daily   cholecalciferol (VITAMIN D) 25 MCG (1000 UNIT) tablet Take 1,000 Units by mouth daily.   Continuous Blood Gluc Receiver (FREESTYLE LIBRE READER) DEVI Use as directed   Continuous Blood Gluc Sensor (FREESTYLE LIBRE 3 SENSOR) MISC Place one sensor every 14 days   diltiazem (CARDIZEM CD) 120 MG 24 hr capsule Take 1 capsule (120 mg total) by mouth daily.   escitalopram (LEXAPRO) 20 MG tablet TAKE ONE TABLET BY MOUTH DAILY   Evolocumab (REPATHA SURECLICK) 140 MG/ML SOAJ Inject 1  dose into the skin every 14 (fourteen) days.   glucose blood (ONETOUCH VERIO) test strip USE TO CHECK BLOOD SUGAR  THREE TIMES DAILY   influenza vaccine adjuvanted (FLUAD) 0.5 ML injection Inject into the muscle.   insulin NPH Human (HUMULIN N) 100 UNIT/ML injection Inject 20 units under the skin in the morning and 14 units in evening   Insulin NPH, Human,, Isophane, (HUMULIN N KWIKPEN) 100 UNIT/ML Kiwkpen Inject 20 units into the skin every morning and 14 units every evening.   Insulin Pen Needle (UNIFINE PENTIPS) 32G X 4 MM MISC Inject 4 times subcutaneously   insulin regular (HUMULIN R) 100 units/mL injection Inject 10 Units total into the skin 3 (three) times daily before meals.   Insulin Regular Human (NOVOLIN R FLEXPEN RELION) 100 UNIT/ML KwikPen Inject 5 to 10 units under the skin three times daily.   Insulin Syringe-Needle U-100 (ULTICARE INSULIN SYRINGE) 31G X 5/16" 1 ML MISC Inject 4 times daily subcutaneously   Lancets (ONETOUCH DELICA PLUS LANCET33G) MISC Use twice daily   levothyroxine (SYNTHROID) 137 MCG tablet Take 1 tablet by mouth every morning on an empty stomach  BRAND NAME ONLY   losartan (COZAAR) 25 MG tablet Take 1 tablet (25 mg total) by mouth daily.   metFORMIN (GLUCOPHAGE-XR) 500 MG 24 hr tablet Take 1 tablet by mouth 2 times daily   Multiple Vitamin (MULITIVITAMIN WITH MINERALS) TABS Take 1 tablet by mouth daily.   nitroGLYCERIN (NITROSTAT) 0.4 MG SL tablet Place 1 tablet (0.4 mg total) under the tongue every 5 (five) minutes as needed for chest pain.   omeprazole (PRILOSEC) 20 MG capsule Take 1 capsule (20 mg total) by mouth daily.   oxyCODONE-acetaminophen (PERCOCET) 5-325 MG tablet Take 1 tablet by mouth every 6 (six) hours.   potassium chloride SA (KLOR-CON M) 20 MEQ tablet Take 1 tablet (20 mEq total) by mouth twice daily for 3 days only, then decrease to taking 1 tablet (20 mEq total) by mouth daily thereafter.  Take in concurrence with torsemide.   pregabalin (LYRICA) 75 MG capsule Take 1 capsule (75 mg total) by mouth 2 (two) times daily.   spironolactone (ALDACTONE) 25 MG  tablet Take 0.5 tablets (12.5 mg total) by mouth daily.   torsemide (DEMADEX) 20 MG tablet Take 1 tablet (20 mg total) by mouth twice daily for 3 days only, then decrease to taking 1 tablet (20 mg total) by mouth daily thereafter.   No facility-administered encounter medications on file as of 07/31/2022.    Recent Relevant Labs: Lab Results  Component Value Date/Time   HGBA1C 5.9 (H) 05/16/2022 12:00 PM   HGBA1C 7.4 (H) 09/08/2020 04:16 AM   HGBA1C 6.5 05/05/2020 12:00 AM   MICROALBUR 0.8 10/07/2015 01:30 PM   MICROALBUR 1.1 02/08/2014 02:49 PM    Kidney Function Lab Results  Component Value Date/Time   CREATININE 0.80 07/24/2022 12:15 PM   CREATININE 0.86 07/03/2022 02:11 PM   CREATININE 0.87 02/26/2022 10:37 AM   CREATININE 0.69 09/06/2021 10:05 AM   GFR 79.08 09/22/2021 10:56 AM   GFRNONAA >60 06/02/2022 03:35 AM   GFRNONAA >60 02/26/2022 10:37 AM   GFRAA 99 12/05/2020 12:00 AM   GFRAA >60 08/14/2019 10:25 AM    Star Rating Drugs:  Losartan 25 mg - last filled 07/11/22 90 DS at [facility] Metformin 500 mg - last filled 06/07/22 90 DS at [facility]   Care Gaps: Diabetic Urine - Overdue Foot Exam - Overdue Eye Exam - Overdue COVID  Booster - Overdue AWV - 04/03/21    Pamala Duffel CMA Clinical Pharmacist Assistant [phone]

## 2022-08-01 ENCOUNTER — Other Ambulatory Visit (HOSPITAL_BASED_OUTPATIENT_CLINIC_OR_DEPARTMENT_OTHER): Payer: Self-pay

## 2022-08-01 MED ORDER — SYNTHROID 137 MCG PO TABS
137.0000 ug | ORAL_TABLET | Freq: Every morning | ORAL | 1 refills | Status: DC
  Filled 2022-08-01: qty 90, 90d supply, fill #0
  Filled 2022-10-29: qty 90, 90d supply, fill #1

## 2022-08-02 ENCOUNTER — Other Ambulatory Visit: Payer: Self-pay

## 2022-08-02 ENCOUNTER — Other Ambulatory Visit (HOSPITAL_BASED_OUTPATIENT_CLINIC_OR_DEPARTMENT_OTHER): Payer: Self-pay

## 2022-08-02 ENCOUNTER — Other Ambulatory Visit (HOSPITAL_COMMUNITY): Payer: Medicare Other

## 2022-08-02 DIAGNOSIS — Z794 Long term (current) use of insulin: Secondary | ICD-10-CM

## 2022-08-02 DIAGNOSIS — I5032 Chronic diastolic (congestive) heart failure: Secondary | ICD-10-CM

## 2022-08-02 DIAGNOSIS — I5033 Acute on chronic diastolic (congestive) heart failure: Secondary | ICD-10-CM

## 2022-08-02 DIAGNOSIS — Z79899 Other long term (current) drug therapy: Secondary | ICD-10-CM

## 2022-08-02 DIAGNOSIS — M7989 Other specified soft tissue disorders: Secondary | ICD-10-CM

## 2022-08-02 DIAGNOSIS — Z952 Presence of prosthetic heart valve: Secondary | ICD-10-CM

## 2022-08-02 MED ORDER — TORSEMIDE 20 MG PO TABS
20.0000 mg | ORAL_TABLET | Freq: Every day | ORAL | 3 refills | Status: DC
  Filled 2022-08-02: qty 90, 90d supply, fill #0

## 2022-08-05 ENCOUNTER — Encounter: Payer: Self-pay | Admitting: Physical Therapy

## 2022-08-05 NOTE — Therapy (Signed)
[facility] [address] [place], [place], [address] Phone: [phone]   Fax:  [phone]  Patient Details  Name: Breanna Webster MRN: [id number] Date of Birth: 1945/06/06 Referring Provider:  Melvyn Novas, MD  Encounter Date: 08/05/2022   PHYSICAL THERAPY DISCHARGE SUMMARY  Visits from Start of Care: 27  (17 of these visits were aquatic PT) with last attended aquatic session on 12-04-21  Current functional level related to goals / functional outcomes: LTG's not assessed as pt did not return to land PT for discharge session/reassessment; pt was to return to aquatic PT for 1 final session after going on cruise vacation in Aug. 2023, but did not return to aquatic PT   Remaining deficits: Continued decreased independence and safety with gait at time of last attended aquatic session on 12-04-21 Continued decreased standing balance Continued c/o Rt knee pain which impacted RLE SLS activities and tolerance for weight bearing on land   Education / Equipment: Pt had been instructed in aquatic exercises if she decided to join facility Optician, dispensing) for access to a pool   Patient agrees to discharge. Patient goals were  not assessed due to pt not returning to PT after aquatic session on 12-04-21 . Patient is being discharged due to not returning since the last visit.   Kary Kos, PT 08/05/2022, 3:58 PM  [facility] [address] [place], [place], [address] Phone: [phone]   Fax:  [phone]

## 2022-08-07 DIAGNOSIS — R011 Cardiac murmur, unspecified: Secondary | ICD-10-CM | POA: Diagnosis not present

## 2022-08-07 DIAGNOSIS — I509 Heart failure, unspecified: Secondary | ICD-10-CM | POA: Diagnosis not present

## 2022-08-07 DIAGNOSIS — I2510 Atherosclerotic heart disease of native coronary artery without angina pectoris: Secondary | ICD-10-CM | POA: Diagnosis not present

## 2022-08-07 DIAGNOSIS — G20A1 Parkinson's disease without dyskinesia, without mention of fluctuations: Secondary | ICD-10-CM | POA: Diagnosis not present

## 2022-08-07 DIAGNOSIS — R3915 Urgency of urination: Secondary | ICD-10-CM | POA: Diagnosis not present

## 2022-08-07 DIAGNOSIS — R350 Frequency of micturition: Secondary | ICD-10-CM | POA: Diagnosis not present

## 2022-08-07 DIAGNOSIS — I110 Hypertensive heart disease with heart failure: Secondary | ICD-10-CM | POA: Diagnosis not present

## 2022-08-07 DIAGNOSIS — Z471 Aftercare following joint replacement surgery: Secondary | ICD-10-CM | POA: Diagnosis not present

## 2022-08-09 ENCOUNTER — Telehealth: Payer: Self-pay | Admitting: Neurology

## 2022-08-09 NOTE — Telephone Encounter (Signed)
Pt says she was told to call if she had any problems taking the Phenobarbital prescribed by Dr Vickey Huger.  Pt reports this medication makes her loopy, out of it.  Pt is asking for a call to discuss.

## 2022-08-09 NOTE — Telephone Encounter (Signed)
I couldn't get an answer from Ms.Streater on the main phone number.  In general- when overly sedated or when feeling cognitively impaired after starting a new medication, the medication is to be discontinued temporarily and we can discuss more on Monday- I would be curious if the tremor has responded.

## 2022-08-10 ENCOUNTER — Other Ambulatory Visit (HOSPITAL_BASED_OUTPATIENT_CLINIC_OR_DEPARTMENT_OTHER): Payer: Self-pay

## 2022-08-10 ENCOUNTER — Encounter: Payer: Self-pay | Admitting: Physician Assistant

## 2022-08-10 ENCOUNTER — Ambulatory Visit: Payer: Medicare Other | Attending: Physician Assistant | Admitting: Physician Assistant

## 2022-08-10 VITALS — BP 98/58 | HR 67 | Ht 65.0 in | Wt 175.0 lb

## 2022-08-10 DIAGNOSIS — I447 Left bundle-branch block, unspecified: Secondary | ICD-10-CM

## 2022-08-10 DIAGNOSIS — I350 Nonrheumatic aortic (valve) stenosis: Secondary | ICD-10-CM | POA: Insufficient documentation

## 2022-08-10 DIAGNOSIS — I2510 Atherosclerotic heart disease of native coronary artery without angina pectoris: Secondary | ICD-10-CM

## 2022-08-10 DIAGNOSIS — Z952 Presence of prosthetic heart valve: Secondary | ICD-10-CM | POA: Diagnosis not present

## 2022-08-10 DIAGNOSIS — I10 Essential (primary) hypertension: Secondary | ICD-10-CM

## 2022-08-10 DIAGNOSIS — Z79899 Other long term (current) drug therapy: Secondary | ICD-10-CM | POA: Diagnosis not present

## 2022-08-10 DIAGNOSIS — I5033 Acute on chronic diastolic (congestive) heart failure: Secondary | ICD-10-CM | POA: Diagnosis not present

## 2022-08-10 DIAGNOSIS — Z794 Long term (current) use of insulin: Secondary | ICD-10-CM | POA: Diagnosis not present

## 2022-08-10 DIAGNOSIS — I5032 Chronic diastolic (congestive) heart failure: Secondary | ICD-10-CM | POA: Insufficient documentation

## 2022-08-10 DIAGNOSIS — I480 Paroxysmal atrial fibrillation: Secondary | ICD-10-CM | POA: Insufficient documentation

## 2022-08-10 DIAGNOSIS — M7989 Other specified soft tissue disorders: Secondary | ICD-10-CM | POA: Diagnosis not present

## 2022-08-10 DIAGNOSIS — E118 Type 2 diabetes mellitus with unspecified complications: Secondary | ICD-10-CM | POA: Diagnosis not present

## 2022-08-10 MED ORDER — TORSEMIDE 20 MG PO TABS
30.0000 mg | ORAL_TABLET | Freq: Every day | ORAL | 3 refills | Status: DC
Start: 2022-08-10 — End: 2022-10-08
  Filled 2022-08-10: qty 135, 90d supply, fill #0

## 2022-08-10 NOTE — Patient Instructions (Addendum)
Medication Instructions:   START TAKING: DEMEDEX 30 MG ONCE A DY   STOP TAKING AND REMOVE THIS MEDICATION FROM YOUR MEDICATION LIST: SPIRONOLACTONE  AND LOSARTAN   *If you need a refill on your cardiac medications before your next appointment, please call your pharmacy*   Lab Work: RETURN IN 3 -4 WEEKS FOR LABS BMET AND BNP    If you have labs (blood work) drawn today and your tests are completely normal, you will receive your results only by: MyChart Message (if you have MyChart) OR A paper copy in the mail If you have any lab test that is abnormal or we need to change your treatment, we will call you to review the results.   Testing/Procedures: NONE ORDERED  TODAY    Follow-Up: At [facility], you and your health needs are our priority.  As part of our continuing mission to provide you with exceptional heart care, we have created designated Provider Care Teams.  These Care Teams include your primary Cardiologist (physician) and Advanced Practice Providers (APPs -  Physician Assistants and Nurse Practitioners) who all work together to provide you with the care you need, when you need it.  We recommend signing up for the patient portal called "MyChart".  Sign up information is provided on this After Visit Summary.  MyChart is used to connect with patients for Virtual Visits (Telemedicine).  Patients are able to view lab/test results, encounter notes, upcoming appointments, etc.  Non-urgent messages can be sent to your provider as well.   To learn more about what you can do with MyChart, go to [email or web address].    Your next appointment:   3-4  week(s)  Provider:  Jari Favre PA-C      Other Instructions

## 2022-08-10 NOTE — Progress Notes (Signed)
Office Visit    Patient Name: Breanna Webster Date of Encounter: 08/10/2022  PCP:  Shirline Frees, NP   [facility]  Cardiologist:  Meriam Sprague, MD  Advanced Practice Provider:  No care team member to display Electrophysiologist:  None   HPI    Breanna Webster is a 77 y.o. 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  Patient was having shortness of breath with concern of diastolic heart failure was a component.  Difficulty taking Lasix.  Recommended SGLT2 therapy but she pushed back.  She was having a lot of pain and she was getting shots in her knee and shoulders which had not worked well.  Still having dyspnea on exertion.  Contemplated SGLT2 therapy but felt cost is prohibitive.  She was seen by me back in March , she states that she has had some dizziness.  She thinks it might be due to her Webster blood pressure.  She is borderline Webster today here in the clinic.  She does not currently have a blood pressure cuff but does have a wrist cuff.  I have encouraged her to get a new blood pressure cuff.  She does have some lower extremity edema left greater than right which is usual for her she has had extremity ultrasounds without DVT.  We have made some adjustments to her medications today to try to get her swelling down further.  I suggested lower extremity compression hose which she has tried in the past without success.  Today, she she states that she is tired of constantly using the bathroom 8 or 9 times a day since starting on the  spironolactone.  Her legs do look a lot less swollen today.  She also tells me that she drinks several cups of water daily.  She is on a Webster to no salt diet per her husband.  She is requesting discontinuing spironolactone altogether. She also is worried about her Webster blood pressure today.   Reports no shortness of breath nor dyspnea on exertion. Reports no chest pain, pressure, or tightness. No edema, orthopnea, PND. Reports no palpitations.   Past Medical History    Past Medical History:  Diagnosis Date   Anxiety    Arthritis    "back" (04/22/2018)   Back pain    Blood transfusion without reported diagnosis    CAD (coronary artery disease)    a. 03/2018 s/p PCI/DES to the RCA (3.0x15 Onyx DES).   Carotid artery stenosis    Mild   Chest pain    CHF (congestive heart failure)    Chronic lower back pain    Cirrhosis    Colon polyps    Diverticulitis    Diverticulosis    Esophageal thickening    seen on pre TAVR CT scan, also questionable cirrhosis. MRI recommended. Will refer to GI   GERD (gastroesophageal reflux disease)    Grave's disease    Heart murmur    History of colonic polyps 05/22/2017   History of hiatal hernia    Hyperlipidemia    Hypertension    Hypothyroidism    IBS (irritable bowel syndrome)    Osteopenia  Parkinson's syndrome 04/23/2020   Pulmonary nodules    seen on pre TAVR CT. likley benign. no follow up recommended if pt Webster risk.   S/P TAVR (transcatheter aortic valve replacement)    Shortness of breath on exertion    Stroke 2021   x2   Thalassemia minor    Thyroid disease    Type II diabetes mellitus    Past Surgical History:  Procedure Laterality Date   34 HOUR PH STUDY N/A 03/03/2018   Procedure: 24 HOUR PH STUDY;  Surgeon: Breanna Form, MD;  Location: [facility] ENDOSCOPY;  Service: Endoscopy;  Laterality: N/A;   COLONOSCOPY  2023   COLONOSCOPY W/ BIOPSIES AND POLYPECTOMY     CORONARY ANGIOGRAPHY Right 04/21/2018   Procedure: CORONARY  ANGIOGRAPHY (CATH LAB);  Surgeon: Breanna Records, MD;  Location: [facility] INVASIVE CV LAB;  Service: Cardiovascular;  Laterality: Right;   CORONARY STENT INTERVENTION N/A 04/22/2018   Procedure: CORONARY STENT INTERVENTION;  Surgeon: Breanna Records, MD;  Location: [facility] INVASIVE CV LAB;  Service: Cardiovascular;  Laterality: N/A;   DILATION AND CURETTAGE OF UTERUS     ESOPHAGEAL MANOMETRY N/A 03/03/2018   Procedure: ESOPHAGEAL MANOMETRY (EM);  Surgeon: Breanna Form, MD;  Location: [facility] ENDOSCOPY;  Service: Endoscopy;  Laterality: N/A;   HYSTEROSCOPY     fibroids   LAPAROSCOPIC CHOLECYSTECTOMY  1985   LAPAROSCOPY     fibroids   NISSEN FUNDOPLICATION  1990s   POLYPECTOMY     REVERSE SHOULDER ARTHROPLASTY Left 06/01/2022   Procedure: REVERSE SHOULDER ARTHROPLASTY;  Surgeon: Breanna Low, MD;  Location: [facility] ORS;  Service: Orthopedics;  Laterality: Left;  choice with interscalene block   RIGHT/LEFT HEART CATH AND CORONARY ANGIOGRAPHY N/A 02/20/2018   Procedure: RIGHT/LEFT HEART CATH AND CORONARY ANGIOGRAPHY;  Surgeon: Breanna Records, MD;  Location: [facility] INVASIVE CV LAB;  Service: Cardiovascular;  Laterality: N/A;   TEE WITHOUT CARDIOVERSION N/A 07/08/2018   Procedure: TRANSESOPHAGEAL ECHOCARDIOGRAM (TEE);  Surgeon: Breanna Hazel, MD;  Location: [facility] OR;  Service: Open Heart Surgery;  Laterality: N/A;   TEE WITHOUT CARDIOVERSION  10/07/2018   TEE WITHOUT CARDIOVERSION N/A 10/07/2018   Procedure: TRANSESOPHAGEAL ECHOCARDIOGRAM (TEE);  Surgeon: Breanna Bathe, MD;  Location: [facility] ENDOSCOPY;  Service: Cardiovascular;  Laterality: N/A;   TONSILLECTOMY     age 27   TRANSCATHETER AORTIC VALVE REPLACEMENT, TRANSFEMORAL N/A 07/08/2018   Procedure: TRANSCATHETER AORTIC VALVE REPLACEMENT, TRANSFEMORAL;  Surgeon: Breanna Hazel, MD;  Location: [facility] OR;  Service: Open Heart Surgery;  Laterality: N/A;    Allergies  No Known Allergies   EKGs/Labs/Other Studies Reviewed:   The following studies  were reviewed today:  Echo 07/17/22  IMPRESSIONS     1. Left ventricular ejection fraction, by estimation, is 60 to 65%. The  left ventricle has normal function. The left ventricle has no regional  wall motion abnormalities. Diastolic function is indeterminant due to  severe MAC.   2. Right ventricular systolic function was not well visualized. The right  ventricular size is not well visualized.   3. Left atrial size was moderately dilated.   4. The mitral valve is degenerative. Trivial mitral valve regurgitation.  Mild calcific mitral stenosis with mean gradient of at HR 68bpm.  Severe mitral annular calcification.   5. The aortic valve has been repaired/replaced. Aortic valve  regurgitation is not visualized. There is a 23 mm Sapien prosthetic (TAVR)  valve present in the aortic position. Procedure Date: 07/08/18. Echo  findings are consistent  with normal structure and   function of the aortic valve prosthesis. Aortic valve mean gradient  measures 16.3 mmHg. Aortic valve Vmax measures 2.64 m/s.   6. The inferior vena cava is normal in size with greater than 50%  respiratory variability, suggesting right atrial pressure of 3 mmHg.   Comparison(s): No significant change from prior study.   2D Doppler echocardiogram 2022: IMPRESSIONS   1. Left ventricular ejection fraction, by estimation, is 60 to 65%. The  left ventricle has normal function. The left ventricle has no regional  wall motion abnormalities. There is mild left ventricular hypertrophy.  Left ventricular diastolic parameters  are consistent with Grade I diastolic dysfunction (impaired relaxation).  The average left ventricular global longitudinal strain is -19.9 %. The  global longitudinal strain is normal.   2. Right ventricular systolic function is normal. The right ventricular  size is normal.   3. Left atrial size was moderately dilated.   4. The mitral valve is normal in structure. No evidence of mitral  valve  regurgitation. Mild mitral stenosis. The mean mitral valve gradient is 4.5  mmHg. Severe mitral annular calcification.   5. The aortic valve has been repaired/replaced. Aortic valve  regurgitation is not visualized. No aortic stenosis is present. There is a  23 mm Edwards Sapien prosthetic (TAVR) valve present in the aortic  position. Echo findings are consistent with normal   structure and function of the aortic valve prosthesis. Aortic valve area,  by VTI measures 1.99 cm. Aortic valve mean gradient measures 15.0 mmHg.  Aortic valve Vmax measures 2.43 m/s.   6. The inferior vena cava is normal in size with greater than 50%  respiratory variability, suggesting right atrial pressure of 3 mmHg.   Comparison(s): No significant change from prior study. Prior images  reviewed side by side.     EKG:  EKG is not ordered today.   Recent Labs: 05/16/2022: ALT 28 07/03/2022: NT-Pro BNP 503 07/24/2022: BUN 21; Creatinine, Ser 0.80; Hemoglobin 10.4; Magnesium 1.8; Platelets 211; Potassium 4.2; Sodium 142  Recent Lipid Panel    Component Value Date/Time   CHOL 106 09/06/2021 1018   CHOL 156 11/21/2016 1113   TRIG 101 09/06/2021 1018   HDL 50 09/06/2021 1018   HDL 44 11/21/2016 1113   CHOLHDL 2.1 09/06/2021 1018   VLDL 20 09/06/2021 1018   LDLCALC 36 09/06/2021 1018   LDLCALC 91 11/21/2016 1113   LDLDIRECT 98.0 05/24/2014 1343     Home Medications   Current Meds  Medication Sig   acetaminophen (TYLENOL) 500 MG tablet Take 500 mg by mouth every 6 (six) hours as needed for mild pain.   apixaban (ELIQUIS) 5 MG TABS tablet Take 1 tablet (5 mg total) by mouth 2 (two) times daily.   b complex vitamins capsule Take 1 capsule by mouth daily.   BD PEN NEEDLE NANO 2ND GEN 32G X 4 MM MISC    cholecalciferol (VITAMIN D) 25 MCG (1000 UNIT) tablet Take 1,000 Units by mouth daily.   Continuous Blood Gluc Receiver (FREESTYLE LIBRE READER) DEVI Use as directed   Continuous Blood Gluc Sensor  (FREESTYLE LIBRE 3 SENSOR) MISC Place one sensor every 14 days   diltiazem (CARDIZEM CD) 120 MG 24 hr capsule Take 1 capsule (120 mg total) by mouth daily.   escitalopram (LEXAPRO) 20 MG tablet TAKE ONE TABLET BY MOUTH DAILY   Evolocumab (REPATHA SURECLICK) 140 MG/ML SOAJ Inject 1 dose into the skin every 14 (fourteen) days.  insulin NPH Human (HUMULIN N) 100 UNIT/ML injection Inject 20 units under the skin in the morning and 14 units in evening   Insulin NPH, Human,, Isophane, (HUMULIN N KWIKPEN) 100 UNIT/ML Kiwkpen Inject 20 units into the skin every morning and 14 units every evening.   Insulin Pen Needle (UNIFINE PENTIPS) 32G X 4 MM MISC Inject 4 times subcutaneously   insulin regular (HUMULIN R) 100 units/mL injection Inject 10 Units total into the skin 3 (three) times daily before meals.   Insulin Regular Human (NOVOLIN R FLEXPEN RELION) 100 UNIT/ML KwikPen Inject 5 to 10 units under the skin three times daily.   Insulin Syringe-Needle U-100 (ULTICARE INSULIN SYRINGE) 31G X 5/16" 1 ML MISC Inject 4 times daily subcutaneously   metFORMIN (GLUCOPHAGE-XR) 500 MG 24 hr tablet Take 1 tablet by mouth 2 times daily   Multiple Vitamin (MULITIVITAMIN WITH MINERALS) TABS Take 1 tablet by mouth daily.   nitroGLYCERIN (NITROSTAT) 0.4 MG SL tablet Place 1 tablet (0.4 mg total) under the tongue every 5 (five) minutes as needed for chest pain.   omeprazole (PRILOSEC) 20 MG capsule Take 1 capsule (20 mg total) by mouth daily.   oxyCODONE-acetaminophen (PERCOCET) 5-325 MG tablet Take 1 tablet by mouth every 6 (six) hours.   potassium chloride SA (KLOR-CON M) 20 MEQ tablet Take 1 tablet (20 mEq total) by mouth twice daily for 3 days only, then decrease to taking 1 tablet (20 mEq total) by mouth daily thereafter.  Take in concurrence with torsemide.   pregabalin (LYRICA) 75 MG capsule Take 1 capsule (75 mg total) by mouth 2 (two) times daily.   primidone (MYSOLINE) 50 MG tablet Take 1 tablet (50 mg total) by  mouth every morning.   SYNTHROID 137 MCG tablet Take 1 tablet (137 mcg total) by mouth every morning on empty stomach.   [DISCONTINUED] losartan (COZAAR) 25 MG tablet Take 1 tablet (25 mg total) by mouth daily.   [DISCONTINUED] spironolactone (ALDACTONE) 25 MG tablet Take 0.5 tablets (12.5 mg total) by mouth daily.   [DISCONTINUED] torsemide (DEMADEX) 20 MG tablet Take 1 tablet (20 mg total) by mouth daily.     Review of Systems      All other systems reviewed and are otherwise negative except as noted above.  Physical Exam    VS:  BP (!) 98/58   Pulse 67   Ht 5\' 5"  (1.651 m)   Wt 175 lb (79.4 kg)   SpO2 98%   BMI 29.12 kg/m  , BMI Body mass index is 29.12 kg/m.  Wt Readings from Last 3 Encounters:  08/10/22 175 lb (79.4 kg)  07/31/22 178 lb (80.7 kg)  07/10/22 170 lb (77.1 kg)     GEN: Well nourished, well developed, in no acute distress. HEENT: normal. Neck: Supple, no JVD, carotid bruits, or masses. Cardiac: RRR, 4/6 systolic murmur, rubs, or gallops. No clubbing, cyanosis, edema.  Radials/PT 1+ and equal bilaterally.  Respiratory:  Respirations regular and unlabored, clear to auscultation bilaterally. GI: Soft, nontender, nondistended. MS: No deformity or atrophy. Skin: Warm and dry, no rash. Neuro:  Strength and sensation are intact. Psych: Normal affect.  Assessment & Plan    Coronary artery disease -No current chest pain -Continue current medications which include Eliquis 5 mg twice daily, Cardizem CD 120 mg daily, Repatha 140 mg/mL every 2 weeks, Nitro as needed Demadex 20 mg daily-increased to 30mg , potassium 20 mEq daily  Paroxysmal atrial fibrillation -Normal sinus rhythm today, heart rate 81 bpm -Remains  on Eliquis and Cardizem -Echocardiogram ordered  Chronic heart failure with preserved ejection fraction -She does not want to be on spirolactone any longer and she also does not want to go up to 40 mg of Demadex.  We compromised at 30 mg on her Demadex  and to monitor for lower extremity edema. -edema improved -Compression hose recommended acute medications does not tolerate -Discontinue losartan to make more room for diuresis -Encouraged Webster-sodium diet -Encourage daily weights -BMP reviewed  AS status post TAVR -echo reviewed with the patient today  Hyperlipidemia -Most recent LDL 136, HDL 50, triglycerides 86 -Will be due for updated lipid panel at next visit  LBBB -stable, no EKG this visit  Hypertension -Webster normal today, 98/58, some dizziness -We have discontinued her losartan to 25 mg daily and asked her to keep track of her blood pressure -We have asked her to obtain a new blood pressure cuff         Disposition: Follow up 4 weeks with Meriam Sprague, MD or APP.  Signed, Sharlene Dory, PA-C 08/10/2022, 4:43 PM [facility]

## 2022-08-15 ENCOUNTER — Other Ambulatory Visit (HOSPITAL_BASED_OUTPATIENT_CLINIC_OR_DEPARTMENT_OTHER): Payer: Self-pay

## 2022-08-15 DIAGNOSIS — I10 Essential (primary) hypertension: Secondary | ICD-10-CM | POA: Diagnosis not present

## 2022-08-15 DIAGNOSIS — E1165 Type 2 diabetes mellitus with hyperglycemia: Secondary | ICD-10-CM | POA: Diagnosis not present

## 2022-08-15 DIAGNOSIS — E039 Hypothyroidism, unspecified: Secondary | ICD-10-CM | POA: Diagnosis not present

## 2022-08-15 DIAGNOSIS — E782 Mixed hyperlipidemia: Secondary | ICD-10-CM | POA: Diagnosis not present

## 2022-08-15 DIAGNOSIS — I2510 Atherosclerotic heart disease of native coronary artery without angina pectoris: Secondary | ICD-10-CM | POA: Diagnosis not present

## 2022-08-16 ENCOUNTER — Telehealth: Payer: Self-pay | Admitting: Cardiology

## 2022-08-16 DIAGNOSIS — Z4789 Encounter for other orthopedic aftercare: Secondary | ICD-10-CM | POA: Diagnosis not present

## 2022-08-16 NOTE — Telephone Encounter (Signed)
Pt called wanting to know when she will be getting an answer to her Mychart message. Pt stated that she would like her Husbands phone to be called first just in case she is in with her doctor. Please advise. [phone]

## 2022-08-16 NOTE — Telephone Encounter (Signed)
Pt called to clarify her medication regimen and changes that Jari Favre PA-C made at last OV with the pt on 4/19.  Informed the pt that Tessa discontinued her losartan and spironolactone and advised her to take new dosing of torsemide 30 mg po daily.  She is aware that with her torsemide she will need to take 1.5 tablets (30 mg total) po daily to get this equivalent, being torsemide only comes in 20 mg tablets.   Advised her that Julian Hy wanted her to come in for repeat bmet and pro-bnp in 3 weeks, which is scheduled with our office on 08/31/22.   Reiterated her that Julian Hy wanted her to obtain a new BP cuff and start keeping track of this at home.   Pt is aware that Tessa sent in new dosing of torsemide to her pharmacy on file, at last OV on 4/19.   Did advise her that she should take the torsemide early in the morning, so that she isn't frequently interrupted with bathroom breaks, at hour of sleep.  She states she has been taking this mid-late morning and is going to start taking this earlier, so that she doesn't have to urinate as frequently at night.   Pt verbalized understanding and agrees with this plan.  Pt states she clearly understands all instructions and has been taking everything as instructed, just wanted to clarify she was doing this correctly and that she will still be getting some kind of diuresis.    Pt was appreciative for all the assistance provided.

## 2022-08-16 NOTE — Telephone Encounter (Signed)
Pt c/o medication issue:  1. Name of Medication: Fluid pill   2. How are you currently taking this medication (dosage and times per day)?   3. Are you having a reaction (difficulty breathing--STAT)?   4. What is your medication issue? Patient states that she was removed from two medications for fluid due to having to use the restroom too much. She states she would like to know what she should be placed on now to help with any swelling. Please advise.

## 2022-08-16 NOTE — Telephone Encounter (Signed)
Sometimes it does help to take it at night, as it sedates for deeper sleeper and the tremor effect may be lasting well into the morning hours.  I am hopeful to use the lowest dose possible in daytime to avoid excessive sedation but gain tremor control.  Your metabolism will need at least 4 weeks to get used to this substance and feel less sedated, but it is only of value if the tremor responded- and it did.   Let me see if there is still a liquid form that can be used at lower doses.   Melvyn Novas, MD

## 2022-08-16 NOTE — Telephone Encounter (Signed)
I spoke to Breanna Webster and we decided to cut the 50 mg tablet of primidone in half, take it at bedtime and see if she has a beneficial effect on tremor the following day without as much sedation .  We can even go further and use a quarter tablet if sedation is still an issue.  If this works for her, we can continue night time dosing for 2 -3 weeks and when sedation is less prominent  can finally switch to a daytime dosing.   Husband and patient agreed. CD

## 2022-08-16 NOTE — Telephone Encounter (Signed)
Assessment & Plan    Coronary artery disease -No current chest pain -Continue current medications which include Eliquis 5 mg twice daily, Cardizem CD 120 mg daily, Repatha 140 mg/mL every 2 weeks, Nitro as needed Demadex 20 mg daily-increased to , potassium 20 mEq daily   Paroxysmal atrial fibrillation -Normal sinus rhythm today, heart rate 81 bpm -Remains on Eliquis and Cardizem -Echocardiogram ordered   Chronic heart failure with preserved ejection fraction -She does not want to be on spirolactone any longer and she also does not want to go up to 40 mg of Demadex.  We compromised at 30 mg on her Demadex and to monitor for lower extremity edema. -edema improved -Compression hose recommended acute medications does not tolerate -Discontinue losartan to make more room for diuresis -Encouraged low-sodium diet -Encourage daily weights -BMP reviewed   AS status post TAVR -echo reviewed with the patient today   Hyperlipidemia -Most recent LDL 136, HDL 50, triglycerides 86 -Will be due for updated lipid panel at next visit   LBBB -stable, no EKG this visit   Hypertension -Low normal today, 98/58, some dizziness -We have discontinued her losartan to 25 mg daily and asked her to keep track of her blood pressure -We have asked her to obtain a new blood pressure cuff

## 2022-08-17 ENCOUNTER — Telehealth: Payer: Self-pay | Admitting: Adult Health

## 2022-08-17 NOTE — Telephone Encounter (Signed)
Contacted Breanna Webster to schedule their annual wellness visit. Appointment made for 08/29/22.  Rudell Cobb AWV direct phone # [phone]

## 2022-08-18 ENCOUNTER — Other Ambulatory Visit (HOSPITAL_BASED_OUTPATIENT_CLINIC_OR_DEPARTMENT_OTHER): Payer: Self-pay

## 2022-08-20 NOTE — Telephone Encounter (Signed)
Pt stated she wants to let Dr. Vickey Huger know there have been no changes in the hands, Said the right hand is worse. Stated the mouth seems to be better.

## 2022-08-27 ENCOUNTER — Other Ambulatory Visit (HOSPITAL_BASED_OUTPATIENT_CLINIC_OR_DEPARTMENT_OTHER): Payer: Self-pay

## 2022-08-28 ENCOUNTER — Encounter: Payer: Self-pay | Admitting: Hematology

## 2022-08-28 ENCOUNTER — Other Ambulatory Visit (HOSPITAL_BASED_OUTPATIENT_CLINIC_OR_DEPARTMENT_OTHER): Payer: Self-pay

## 2022-08-28 DIAGNOSIS — M25561 Pain in right knee: Secondary | ICD-10-CM | POA: Diagnosis not present

## 2022-08-28 MED ORDER — CYCLOBENZAPRINE HCL 5 MG PO TABS
5.0000 mg | ORAL_TABLET | Freq: Every evening | ORAL | 0 refills | Status: DC
  Filled 2022-08-28: qty 30, 30d supply, fill #0

## 2022-08-29 ENCOUNTER — Ambulatory Visit (INDEPENDENT_AMBULATORY_CARE_PROVIDER_SITE_OTHER): Payer: Medicare Other

## 2022-08-29 ENCOUNTER — Other Ambulatory Visit (HOSPITAL_BASED_OUTPATIENT_CLINIC_OR_DEPARTMENT_OTHER): Payer: Self-pay

## 2022-08-29 VITALS — BP 120/62 | HR 69 | Temp 98.2°F | Ht 65.0 in | Wt 176.2 lb

## 2022-08-29 DIAGNOSIS — Z0000 Encounter for general adult medical examination without abnormal findings: Secondary | ICD-10-CM

## 2022-08-29 MED ORDER — METFORMIN HCL ER 500 MG PO TB24
500.0000 mg | ORAL_TABLET | Freq: Two times a day (BID) | ORAL | 2 refills | Status: DC
  Filled 2022-08-29: qty 180, 90d supply, fill #0
  Filled 2023-01-23: qty 180, 90d supply, fill #1
  Filled 2023-06-08: qty 180, 90d supply, fill #2

## 2022-08-29 NOTE — Patient Instructions (Addendum)
Breanna Webster , Thank you for taking time to come for your Medicare Wellness Visit. I appreciate your ongoing commitment to your health goals. Please review the following plan we discussed and let me know if I can assist you in the future.   These are the goals we discussed:  Goals       Increase physical activity      Patient states she wants to start back walking.      Patient Stated      I do not want to sleep as much as I do.      Patient stated (pt-stated)      I want to be free of pain.        This is a list of the screening recommended for you and due dates:  Health Maintenance  Topic Date Due   Complete foot exam   04/08/2019   Eye exam for diabetics  08/29/2022*   Yearly kidney health urinalysis for diabetes  08/30/2022*   COVID-19 Vaccine (5 - 2023-24 season) 09/14/2022*   Hemoglobin A1C  11/14/2022   Flu Shot  11/22/2022   Yearly kidney function blood test for diabetes  07/24/2023   Medicare Annual Wellness Visit  08/29/2023   DTaP/Tdap/Td vaccine (2 - Td or Tdap) 12/28/2024   Pneumonia Vaccine  Completed   DEXA scan (bone density measurement)  Completed   Hepatitis C Screening: USPSTF Recommendation to screen - Ages 30-79 yo.  Completed   Zoster (Shingles) Vaccine  Completed   HPV Vaccine  Aged Out   Colon Cancer Screening  Discontinued  *Topic was postponed. The date shown is not the original due date.   Opioid Pain Medicine Management Opioids are powerful medicines that are used to treat moderate to severe pain. When used for short periods of time, they can help you to: Sleep better. Do better in physical or occupational therapy. Feel better in the first few days after an injury. Recover from surgery. Opioids should be taken with the supervision of a trained health care provider. They should be taken for the shortest period of time possible. This is because opioids can be addictive, and the longer you take opioids, the greater your risk of addiction. This  addiction can also be called opioid use disorder. What are the risks? Using opioid pain medicines for longer than 3 days increases your risk of side effects. Side effects include: Constipation. Nausea and vomiting. Breathing difficulties (respiratory depression). Drowsiness. Confusion. Opioid use disorder. Itching. Taking opioid pain medicine for a long period of time can affect your ability to do daily tasks. It also puts you at risk for: Motor vehicle crashes. Depression. Suicide. Heart attack. Overdose, which can be life-threatening. What is a pain treatment plan? A pain treatment plan is an agreement between you and your health care provider. Pain is unique to each person, and treatments vary depending on your condition. To manage your pain, you and your health care provider need to work together. To help you do this: Discuss the goals of your treatment, including how much pain you might expect to have and how you will manage the pain. Review the risks and benefits of taking opioid medicines. Remember that a good treatment plan uses more than one approach and minimizes the chance of side effects. Be honest about the amount of medicines you take and about any drug or alcohol use. Get pain medicine prescriptions from only one health care provider. Pain can be managed with many types of alternative  treatments. Ask your health care provider to refer you to one or more specialists who can help you manage pain through: Physical or occupational therapy. Counseling (cognitive behavioral therapy). Good nutrition. Biofeedback. Massage. Meditation. Non-opioid medicine. Following a gentle exercise program. How to use opioid pain medicine Taking medicine Take your pain medicine exactly as told by your health care provider. Take it only when you need it. If your pain gets less severe, you may take less than your prescribed dose if your health care provider approves. If you are not having  pain, do nottake pain medicine unless your health care provider tells you to take it. If your pain is severe, do nottry to treat it yourself by taking more pills than instructed on your prescription. Contact your health care provider for help. Write down the times when you take your pain medicine. It is easy to become confused while on pain medicine. Writing the time can help you avoid overdose. Take other over-the-counter or prescription medicines only as told by your health care provider. Keeping yourself and others safe  While you are taking opioid pain medicine: Do not drive, use machinery, or power tools. Do not sign legal documents. Do not drink alcohol. Do not take sleeping pills. Do not supervise children by yourself. Do not do activities that require climbing or being in high places. Do not go to a lake, river, ocean, spa, or swimming pool. Do not share your pain medicine with anyone. Keep pain medicine in a locked cabinet or in a secure area where pets and children cannot reach it. Stopping your use of opioids If you have been taking opioid medicine for more than a few weeks, you may need to slowly decrease (taper) how much you take until you stop completely. Tapering your use of opioids can decrease your risk of symptoms of withdrawal, such as: Pain and cramping in the abdomen. Nausea. Sweating. Sleepiness. Restlessness. Uncontrollable shaking (tremors). Cravings for the medicine. Do not attempt to taper your use of opioids on your own. Talk with your health care provider about how to do this. Your health care provider may prescribe a step-down schedule based on how much medicine you are taking and how long you have been taking it. Getting rid of leftover pills Do not save any leftover pills. Get rid of leftover pills safely by: Taking the medicine to a prescription take-back program. This is usually offered by the county or law enforcement. Bringing them to a pharmacy that  has a drug disposal container. Flushing them down the toilet. Check the label or package insert of your medicine to see whether this is safe to do. Throwing them out in the trash. Check the label or package insert of your medicine to see whether this is safe to do. If it is safe to throw it out, remove the medicine from the original container, put it into a sealable bag or container, and mix it with used coffee grounds, food scraps, dirt, or cat litter before putting it in the trash. Follow these instructions at home: Activity Do exercises as told by your health care provider. Avoid activities that make your pain worse. Return to your normal activities as told by your health care provider. Ask your health care provider what activities are safe for you. General instructions You may need to take these actions to prevent or treat constipation: Drink enough fluid to keep your urine pale yellow. Take over-the-counter or prescription medicines. Eat foods that are high in fiber, such as  beans, whole grains, and fresh fruits and vegetables. Limit foods that are high in fat and processed sugars, such as fried or sweet foods. Keep all follow-up visits. This is important. Where to find support If you have been taking opioids for a long time, you may benefit from receiving support for quitting from a local support group or counselor. Ask your health care provider for a referral to these resources in your area. Where to find more information Centers for Disease Control and Prevention (CDC): FootballExhibition.com.br [place] Food and Drug Administration (FDA): PumpkinSearch.com.ee Get help right away if: You may have taken too much of an opioid (overdosed). Common symptoms of an overdose: Your breathing is slower or more shallow than normal. You have a very slow heartbeat (pulse). You have slurred speech. You have nausea and vomiting. Your pupils become very small. You have other potential symptoms: You are very confused. You  faint or feel like you will faint. You have cold, clammy skin. You have blue lips or fingernails. You have thoughts of harming yourself or harming others. These symptoms may represent a serious problem that is an emergency. Do not wait to see if the symptoms will go away. Get medical help right away. Call your local emergency services (911 in the [place] Do not drive yourself to the hospital.  If you ever feel like you may hurt yourself or others, or have thoughts about taking your own life, get help right away. Go to your nearest emergency department or: Call your local emergency services (911 in the [place] Call the [facility] (413-142-5766 in the [place] Call a suicide crisis helpline, such as the National Suicide Prevention Lifeline at 224-770-4318 or 988 in the [place] This is open 24 hours a day in the [place] Text the Crisis Text Line at 763-323-1015 (in the [place] Summary Opioid medicines can help you manage moderate to severe pain for a short period of time. A pain treatment plan is an agreement between you and your health care provider. Discuss the goals of your treatment, including how much pain you might expect to have and how you will manage the pain. If you think that you or someone else may have taken too much of an opioid, get medical help right away. This information is not intended to replace advice given to you by your health care provider. Make sure you discuss any questions you have with your health care provider. Document Revised: 11/02/2020 Document Reviewed: 07/20/2020 Elsevier Patient Education  2023 [employer]  Advanced directives: In chart  Conditions/risks identified: None  Next appointment: Follow up in one year for your annual wellness visit    Preventive Care 65 Years and Older, Female Preventive care refers to lifestyle choices and visits with your health care provider that can promote health and wellness. What does preventive care include? A  yearly physical exam. This is also called an annual well check. Dental exams once or twice a year. Routine eye exams. Ask your health care provider how often you should have your eyes checked. Personal lifestyle choices, including: Daily care of your teeth and gums. Regular physical activity. Eating a healthy diet. Avoiding tobacco and drug use. Limiting alcohol use. Practicing safe sex. Taking low-dose aspirin every day. Taking vitamin and mineral supplements as recommended by your health care provider. What happens during an annual well check? The services and screenings done by your health care provider during your annual well check will depend on your age, overall health, lifestyle risk factors,  and family history of disease. Counseling  Your health care provider may ask you questions about your: Alcohol use. Tobacco use. Drug use. Emotional well-being. Home and relationship well-being. Sexual activity. Eating habits. History of falls. Memory and ability to understand (cognition). Work and work Astronomer. Reproductive health. Screening  You may have the following tests or measurements: Height, weight, and BMI. Blood pressure. Lipid and cholesterol levels. These may be checked every 5 years, or more frequently if you are over 67 years old. Skin check. Lung cancer screening. You may have this screening every year starting at age 6 if you have a 30-pack-year history of smoking and currently smoke or have quit within the past 15 years. Fecal occult blood test (FOBT) of the stool. You may have this test every year starting at age 9. Flexible sigmoidoscopy or colonoscopy. You may have a sigmoidoscopy every 5 years or a colonoscopy every 10 years starting at age 64. Hepatitis C blood test. Hepatitis B blood test. Sexually transmitted disease (STD) testing. Diabetes screening. This is done by checking your blood sugar (glucose) after you have not eaten for a while (fasting).  You may have this done every 1-3 years. Bone density scan. This is done to screen for osteoporosis. You may have this done starting at age 62. Mammogram. This may be done every 1-2 years. Talk to your health care provider about how often you should have regular mammograms. Talk with your health care provider about your test results, treatment options, and if necessary, the need for more tests. Vaccines  Your health care provider may recommend certain vaccines, such as: Influenza vaccine. This is recommended every year. Tetanus, diphtheria, and acellular pertussis (Tdap, Td) vaccine. You may need a Td booster every 10 years. Zoster vaccine. You may need this after age 63. Pneumococcal 13-valent conjugate (PCV13) vaccine. One dose is recommended after age 52. Pneumococcal polysaccharide (PPSV23) vaccine. One dose is recommended after age 44. Talk to your health care provider about which screenings and vaccines you need and how often you need them. This information is not intended to replace advice given to you by your health care provider. Make sure you discuss any questions you have with your health care provider. Document Released: 05/06/2015 Document Revised: 12/28/2015 Document Reviewed: 02/08/2015 Elsevier Interactive Patient Education  2017 [employer].  Fall Prevention in the Home Falls can cause injuries. They can happen to people of all ages. There are many things you can do to make your home safe and to help prevent falls. What can I do on the outside of my home? Regularly fix the edges of walkways and driveways and fix any cracks. Remove anything that might make you trip as you walk through a door, such as a raised step or threshold. Trim any bushes or trees on the path to your home. Use bright outdoor lighting. Clear any walking paths of anything that might make someone trip, such as rocks or tools. Regularly check to see if handrails are loose or broken. Make sure that both sides  of any steps have handrails. Any raised decks and porches should have guardrails on the edges. Have any leaves, snow, or ice cleared regularly. Use sand or salt on walking paths during winter. Clean up any spills in your garage right away. This includes oil or grease spills. What can I do in the bathroom? Use night lights. Install grab bars by the toilet and in the tub and shower. Do not use towel bars as grab bars. Use  non-skid mats or decals in the tub or shower. If you need to sit down in the shower, use a plastic, non-slip stool. Keep the floor dry. Clean up any water that spills on the floor as soon as it happens. Remove soap buildup in the tub or shower regularly. Attach bath mats securely with double-sided non-slip rug tape. Do not have throw rugs and other things on the floor that can make you trip. What can I do in the bedroom? Use night lights. Make sure that you have a light by your bed that is easy to reach. Do not use any sheets or blankets that are too big for your bed. They should not hang down onto the floor. Have a firm chair that has side arms. You can use this for support while you get dressed. Do not have throw rugs and other things on the floor that can make you trip. What can I do in the kitchen? Clean up any spills right away. Avoid walking on wet floors. Keep items that you use a lot in easy-to-reach places. If you need to reach something above you, use a strong step stool that has a grab bar. Keep electrical cords out of the way. Do not use floor polish or wax that makes floors slippery. If you must use wax, use non-skid floor wax. Do not have throw rugs and other things on the floor that can make you trip. What can I do with my stairs? Do not leave any items on the stairs. Make sure that there are handrails on both sides of the stairs and use them. Fix handrails that are broken or loose. Make sure that handrails are as long as the stairways. Check any  carpeting to make sure that it is firmly attached to the stairs. Fix any carpet that is loose or worn. Avoid having throw rugs at the top or bottom of the stairs. If you do have throw rugs, attach them to the floor with carpet tape. Make sure that you have a light switch at the top of the stairs and the bottom of the stairs. If you do not have them, ask someone to add them for you. What else can I do to help prevent falls? Wear shoes that: Do not have high heels. Have rubber bottoms. Are comfortable and fit you well. Are closed at the toe. Do not wear sandals. If you use a stepladder: Make sure that it is fully opened. Do not climb a closed stepladder. Make sure that both sides of the stepladder are locked into place. Ask someone to hold it for you, if possible. Clearly mark and make sure that you can see: Any grab bars or handrails. First and last steps. Where the edge of each step is. Use tools that help you move around (mobility aids) if they are needed. These include: Canes. Walkers. Scooters. Crutches. Turn on the lights when you go into a dark area. Replace any light bulbs as soon as they burn out. Set up your furniture so you have a clear path. Avoid moving your furniture around. If any of your floors are uneven, fix them. If there are any pets around you, be aware of where they are. Review your medicines with your doctor. Some medicines can make you feel dizzy. This can increase your chance of falling. Ask your doctor what other things that you can do to help prevent falls. This information is not intended to replace advice given to you by your health care provider.  Make sure you discuss any questions you have with your health care provider. Document Released: 02/03/2009 Document Revised: 09/15/2015 Document Reviewed: 05/14/2014 Elsevier Interactive Patient Education  2017 [employer].

## 2022-08-29 NOTE — Progress Notes (Signed)
Subjective:   Breanna Webster is a 77 y.o. female who presents for Medicare Annual (Subsequent) preventive examination.  Review of Systems     Cardiac Risk Factors include: advanced age (>59men, >10 women);diabetes mellitus;hypertension     Objective:    Today's Vitals   08/29/22 1406 08/29/22 1419  BP: 120/62   Pulse: 69   Temp: 98.2 F (36.8 C)   TempSrc: Oral   SpO2: 98%   Weight: 176 lb 3.2 oz (79.9 kg)   Height: 5\' 5"  (1.651 m)   PainSc:  6    Body mass index is 29.32 kg/m.     08/29/2022    2:48 PM 06/01/2022   12:58 PM 05/16/2022   11:59 AM 05/26/2021    9:35 AM 04/26/2021    2:09 PM 04/03/2021    1:44 PM 10/03/2020    1:20 PM  Advanced Directives  Does Patient Have a Medical Advance Directive? Yes Yes Yes Yes Yes Yes Yes  Type of Estate agent of [place];Living will Healthcare Power of [place];Living will Healthcare Power of [place];Living will Healthcare Power of [employer] Power of [employer] Power of [place];Living will Healthcare Power of [place];Living will  Does patient want to make changes to medical advance directive? No - Patient declined No - Patient declined    No - Patient declined No - Patient declined  Copy of Healthcare Power of Attorney in Chart? Yes - validated most recent copy scanned in chart (See row information) No - copy requested    No - copy requested No - copy requested  Would patient like information on creating a medical advance directive? No - Patient declined          Current Medications (verified) Outpatient Encounter Medications as of 08/29/2022  Medication Sig   acetaminophen (TYLENOL) 500 MG tablet Take 500 mg by mouth every 6 (six) hours as needed for mild pain.   apixaban (ELIQUIS) 5 MG TABS tablet Take 1 tablet (5 mg total) by mouth 2 (two) times daily.   b complex vitamins capsule Take 1 capsule by mouth daily.   BD PEN NEEDLE NANO 2ND GEN 32G X 4 MM MISC    cholecalciferol (VITAMIN D)  25 MCG (1000 UNIT) tablet Take 1,000 Units by mouth daily.   Continuous Blood Gluc Receiver (FREESTYLE LIBRE READER) DEVI Use as directed   Continuous Blood Gluc Sensor (FREESTYLE LIBRE 3 SENSOR) MISC Place one sensor every 14 days   cyclobenzaprine (FLEXERIL) 5 MG tablet Take 1 tablet (5 mg total) by mouth every evening.   diltiazem (CARDIZEM CD) 120 MG 24 hr capsule Take 1 capsule (120 mg total) by mouth daily.   escitalopram (LEXAPRO) 20 MG tablet TAKE ONE TABLET BY MOUTH DAILY   Evolocumab (REPATHA SURECLICK) 140 MG/ML SOAJ Inject 1 dose into the skin every 14 (fourteen) days.   insulin NPH Human (HUMULIN N) 100 UNIT/ML injection Inject 20 units under the skin in the morning and 14 units in evening   Insulin NPH, Human,, Isophane, (HUMULIN N KWIKPEN) 100 UNIT/ML Kiwkpen Inject 20 units into the skin every morning and 14 units every evening.   Insulin Pen Needle (UNIFINE PENTIPS) 32G X 4 MM MISC Inject 4 times subcutaneously   insulin regular (HUMULIN R) 100 units/mL injection Inject 10 Units total into the skin 3 (three) times daily before meals.   Insulin Regular Human (NOVOLIN R FLEXPEN RELION) 100 UNIT/ML KwikPen Inject 5 to 10 units under the skin three times daily.  Insulin Syringe-Needle U-100 (ULTICARE INSULIN SYRINGE) 31G X 5/16" 1 ML MISC Inject 4 times daily subcutaneously   metFORMIN (GLUCOPHAGE-XR) 500 MG 24 hr tablet Take 1 tablet by mouth 2 times daily   Multiple Vitamin (MULITIVITAMIN WITH MINERALS) TABS Take 1 tablet by mouth daily.   nitroGLYCERIN (NITROSTAT) 0.4 MG SL tablet Place 1 tablet (0.4 mg total) under the tongue every 5 (five) minutes as needed for chest pain.   omeprazole (PRILOSEC) 20 MG capsule Take 1 capsule (20 mg total) by mouth daily.   oxyCODONE-acetaminophen (PERCOCET) 5-325 MG tablet Take 1 tablet by mouth every 6 (six) hours. (Patient not taking: Reported on 08/29/2022)   potassium chloride SA (KLOR-CON M) 20 MEQ tablet Take 1 tablet (20 mEq total) by mouth  twice daily for 3 days only, then decrease to taking 1 tablet (20 mEq total) by mouth daily thereafter.  Take in concurrence with torsemide.   pregabalin (LYRICA) 75 MG capsule Take 1 capsule (75 mg total) by mouth 2 (two) times daily.   primidone (MYSOLINE) 50 MG tablet Take 1 tablet (50 mg total) by mouth every morning.   SYNTHROID 137 MCG tablet Take 1 tablet (137 mcg total) by mouth every morning on empty stomach.   torsemide (DEMADEX) 20 MG tablet Take 1.5 tablets (30 mg total) by mouth daily.   No facility-administered encounter medications on file as of 08/29/2022.    Allergies (verified) Patient has no known allergies.   History: Past Medical History:  Diagnosis Date   Anxiety    Arthritis    "back" (04/22/2018)   Back pain    Blood transfusion without reported diagnosis    CAD (coronary artery disease)    a. 03/2018 s/p PCI/DES to the RCA (3.0x15 Onyx DES).   Carotid artery stenosis    Mild   Chest pain    CHF (congestive heart failure) (HCC)    Chronic lower back pain    Cirrhosis (HCC)    Colon polyps    Diverticulitis    Diverticulosis    Esophageal thickening    seen on pre TAVR CT scan, also questionable cirrhosis. MRI recommended. Will refer to GI   GERD (gastroesophageal reflux disease)    Grave's disease    Heart murmur    History of colonic polyps 05/22/2017   History of hiatal hernia    Hyperlipidemia    Hypertension    Hypothyroidism    IBS (irritable bowel syndrome)    Osteopenia    Parkinson's syndrome 04/23/2020   Pulmonary nodules    seen on pre TAVR CT. likley benign. no follow up recommended if pt low risk.   S/P TAVR (transcatheter aortic valve replacement)    Shortness of breath on exertion    Stroke (HCC) 2021   x2   Thalassemia minor    Thyroid disease    Type II diabetes mellitus (HCC)    Past Surgical History:  Procedure Laterality Date   58 HOUR PH STUDY N/A 03/03/2018   Procedure: 24 HOUR PH STUDY;  Surgeon: Napoleon Form,  MD;  Location: [facility] ENDOSCOPY;  Service: Endoscopy;  Laterality: N/A;   COLONOSCOPY  2023   COLONOSCOPY W/ BIOPSIES AND POLYPECTOMY     CORONARY ANGIOGRAPHY Right 04/21/2018   Procedure: CORONARY ANGIOGRAPHY (CATH LAB);  Surgeon: Lyn Records, MD;  Location: [facility] INVASIVE CV LAB;  Service: Cardiovascular;  Laterality: Right;   CORONARY STENT INTERVENTION N/A 04/22/2018   Procedure: CORONARY STENT INTERVENTION;  Surgeon: Lyn Records, MD;  Location: [facility] INVASIVE CV LAB;  Service: Cardiovascular;  Laterality: N/A;   DILATION AND CURETTAGE OF UTERUS     ESOPHAGEAL MANOMETRY N/A 03/03/2018   Procedure: ESOPHAGEAL MANOMETRY (EM);  Surgeon: Napoleon Form, MD;  Location: [facility] ENDOSCOPY;  Service: Endoscopy;  Laterality: N/A;   HYSTEROSCOPY     fibroids   LAPAROSCOPIC CHOLECYSTECTOMY  1985   LAPAROSCOPY     fibroids   NISSEN FUNDOPLICATION  1990s   POLYPECTOMY     REVERSE SHOULDER ARTHROPLASTY Left 06/01/2022   Procedure: REVERSE SHOULDER ARTHROPLASTY;  Surgeon: Beverely Low, MD;  Location: [facility] ORS;  Service: Orthopedics;  Laterality: Left;  choice with interscalene block   RIGHT/LEFT HEART CATH AND CORONARY ANGIOGRAPHY N/A 02/20/2018   Procedure: RIGHT/LEFT HEART CATH AND CORONARY ANGIOGRAPHY;  Surgeon: Lyn Records, MD;  Location: [facility] INVASIVE CV LAB;  Service: Cardiovascular;  Laterality: N/A;   TEE WITHOUT CARDIOVERSION N/A 07/08/2018   Procedure: TRANSESOPHAGEAL ECHOCARDIOGRAM (TEE);  Surgeon: Kathleene Hazel, MD;  Location: [facility] OR;  Service: Open Heart Surgery;  Laterality: N/A;   TEE WITHOUT CARDIOVERSION  10/07/2018   TEE WITHOUT CARDIOVERSION N/A 10/07/2018   Procedure: TRANSESOPHAGEAL ECHOCARDIOGRAM (TEE);  Surgeon: Jake Bathe, MD;  Location: [facility] ENDOSCOPY;  Service: Cardiovascular;  Laterality: N/A;   TONSILLECTOMY     age 58   TRANSCATHETER AORTIC VALVE REPLACEMENT, TRANSFEMORAL N/A 07/08/2018   Procedure: TRANSCATHETER AORTIC VALVE REPLACEMENT, TRANSFEMORAL;  Surgeon:  Kathleene Hazel, MD;  Location: [facility] OR;  Service: Open Heart Surgery;  Laterality: N/A;   Family History  Adopted: Yes  Problem Relation Age of Onset   Healthy Son        x 2   Headache Other        Cluster headaches   Heart failure Mother    Colon cancer Neg Hx    Pancreatic cancer Neg Hx    Rectal cancer Neg Hx    Stomach cancer Neg Hx    Social History   Socioeconomic History   Marital status: Married    Spouse name: Not on file   Number of children: 2   Years of education: Not on file   Highest education level: Not on file  Occupational History   Occupation: Health and safety inspector of the [employer]  Tobacco Use   Smoking status: Never    Passive exposure: Never   Smokeless tobacco: Never  Vaping Use   Vaping Use: Never used  Substance and Sexual Activity   Alcohol use: No    Alcohol/week: 0.0 standard drinks of alcohol   Drug use: Never   Sexual activity: Not Currently  Other Topics Concern   Not on file  Social History Narrative   Lives with husband in a one story home.     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   Education: college.   Social Determinants of Health   Financial Resource Strain: Low Risk  (08/29/2022)   Overall Financial Resource Strain (CARDIA)    Difficulty of Paying Living Expenses: Not hard at all  Food Insecurity: No Food Insecurity (08/29/2022)   Hunger Vital Sign    Worried About Running Out of Food in the Last Year: Never true    Ran Out of Food in the Last Year: Never true  Transportation Needs: No Transportation Needs (08/29/2022)   PRAPARE - Administrator, Civil Service (Medical): No    Lack of Transportation (Non-Medical): No  Physical Activity: Insufficiently Active (08/29/2022)   Exercise Vital Sign    Days of Exercise per Week: 2 days    Minutes of Exercise per Session: 40 min  Stress: No Stress Concern Present (08/29/2022)   [employer] of Occupational Health - Occupational Stress Questionnaire    Feeling of Stress : Not at all  Social Connections: Socially Integrated (08/29/2022)   Social Connection and Isolation Panel [NHANES]    Frequency of Communication with Friends and Family: More than three times a week    Frequency of Social Gatherings with Friends and Family: More than three times a week    Attends Religious Services: More than 4 times per year    Active Member of [employer] or Organizations: Yes    Attends Engineer, structural: More than 4 times per year    Marital Status: Married    Tobacco Counseling Counseling given: No   Clinical Intake:  Pre-visit preparation completed: No  Pain : 0-10 Pain Score: 6  Pain Type: Chronic pain Pain Location: Arm Pain Orientation: Left Pain Descriptors / Indicators: Aching Pain Onset: More than a month ago Pain Frequency: Constant Pain Relieving Factors: Rx Medication Effect of Pain on Daily Activities: Effects daily activities  Pain Relieving Factors: Rx Medication  BMI - recorded: 29.32 Nutritional Status: BMI 25 -29 Overweight Nutritional Risks: None Diabetes: Yes CBG done?: No Did pt. bring in CBG monitor from home?: NoNutrition Risk Assessment:  Has the patient had any N/V/D within the last 2 months?  No  Does the patient have any non-healing wounds?  No  Has the patient had any unintentional weight loss or weight gain?  No   Diabetes:  Is the patient diabetic?  Yes  If diabetic, was a CBG obtained today?  No  Did the patient bring in their glucometer from home?  No  How often do you monitor your CBG's? Daily/Monitor.   Financial Strains and Diabetes Management:  Are you having any financial strains with the device, your supplies or your medication? No .  Does the patient want to be seen by Chronic Care Management for management of their diabetes?  No  Would the patient like to be referred to a Nutritionist or for Diabetic  Management?  No   Diabetic Exams:  Diabetic Eye Exam: Completed . Overdue for diabetic eye exam. Pt has been advised about the importance in completing this exam. A referral has been placed today. Message sent to referral coordinator for scheduling purposes. Advised pt to expect a call from office referred to regarding appt.  Diabetic Foot Exam: Completed . Pt has been advised about the importance in completing this exam. Pt is scheduled for diabetic foot exam on Followed by Dr Ernest Mallick.    How often do you need to have someone help you when you read instructions, pamphlets, or other written materials from your doctor or pharmacy?: 3 - Sometimes (Husband assist)  Diabetic?  Yes  Interpreter Needed?: No  Information entered by :: Theresa Mulligan LPN   Activities of Daily Living    08/29/2022    2:35 PM 06/01/2022    6:00 PM  In your present state of health, do you have any difficulty performing the following activities:  Hearing? 0 0  Vision? 0 0  Difficulty concentrating or making decisions? 0 0  Walking or climbing stairs? 1 1  Comment Uses cane and wheelchair   Dressing or bathing? 0 0  Doing errands, shopping? 0 0  Preparing Food and  eating ? N   Using the Toilet? N   In the past six months, have you accidently leaked urine? Y   Comment Wears breifs. Followed by Johnnye Sima   Do you have problems with loss of bowel control? Y   Comment Wears breifs. Followed by Gastrologist   Managing your Medications? N   Managing your Finances? N   Housekeeping or managing your Housekeeping? N     Patient Care Team: Shirline Frees, NP as PCP - General (Family Medicine) Meriam Sprague, MD as PCP - Cardiology (Cardiology) Marlene Lard, MD as Consulting Physician (Endocrinology) Micki Riley, MD as Referring Physician (Neurology) Verner Chol, [facility] (Inactive) as Pharmacist (Pharmacist)  Indicate any recent Medical Services you may have received from other than [facility] providers  in the past year (date may be approximate).     Assessment:   This is a routine wellness examination for [place].  Hearing/Vision screen Hearing Screening - Comments:: Denies hearing difficulties   Vision Screening - Comments:: Wears rx glasses - up to date with routine eye exams with  Dr Dione Booze  Dietary issues and exercise activities discussed: Exercise limited by: None identified;orthopedic condition(s)   Goals Addressed               This Visit's Progress     Patient stated (pt-stated)        I want to be free of pain.       Depression Screen    08/29/2022    2:34 PM 04/10/2022    4:06 PM 04/03/2021    1:29 PM 10/03/2020    1:18 PM 03/14/2020   11:39 AM 03/02/2020   10:52 AM 09/23/2019    2:20 PM  PHQ 2/9 Scores  PHQ - 2 Score 0 1 0 0 0 1 1  PHQ- 9 Score     0      Fall Risk    08/29/2022    2:38 PM 04/10/2022    4:06 PM 04/03/2021    1:34 PM 01/31/2021   11:54 AM 12/19/2020    2:16 PM  Fall Risk   Falls in the past year? 1 0 1 1 1   Comment   Patient lost balance in mud. No injury    Number falls in past yr: 0 0 0 1 1  Injury with Fall? 0 0 0 1 0  Comment Followed by medical attention   injuried shoulder tripped on thong sandals  Risk for fall due to : Orthopedic patient No Fall Risks  History of fall(s);Impaired balance/gait;Impaired mobility History of fall(s);Impaired balance/gait  Follow up Falls prevention discussed Falls evaluation completed  Education provided;Falls prevention discussed Education provided;Falls prevention discussed    FALL RISK PREVENTION PERTAINING TO THE HOME:  Any stairs in or around the home? No  If so, are there any without handrails? No  Home free of loose throw rugs in walkways, pet beds, electrical cords, etc? Yes  Adequate lighting in your home to reduce risk of falls? Yes   ASSISTIVE DEVICES UTILIZED TO PREVENT FALLS:  Life alert? No  Use of a cane, walker or w/c? Yes  Grab bars in the bathroom? Yes  Shower chair or bench  in shower? Yes  Elevated toilet seat or a handicapped toilet? No   TIMED UP AND GO:  Was the test performed? Yes .  Length of time to ambulate 10 feet: 10 sec.   Gait slow and steady with assistive device  Cognitive Function:    12/21/2019  10:18 AM  MMSE - Mini Mental State Exam  Orientation to time 5  Orientation to Place 5  Registration 3  Attention/ Calculation 1  Recall 1  Language- name 2 objects 2  Language- repeat 1  Language- follow 3 step command 3  Language- read & follow direction 1  Write a sentence 1  Copy design 1  Total score 24        08/29/2022    2:48 PM 04/03/2021    1:38 PM 03/14/2020   11:43 AM  6CIT Screen  What Year? 0 points 0 points 0 points  What month? 0 points 0 points 0 points  What time? 0 points 0 points 0 points  Count back from 20 0 points 0 points 0 points  Months in reverse 0 points 0 points 0 points  Repeat phrase 0 points 0 points 4 points  Total Score 0 points 0 points 4 points    Immunizations Immunization History  Administered Date(s) Administered   Fluad Quad(high Dose 65+) 01/20/2019, 01/06/2020, 05/02/2022   Hep A / Hep B 06/15/2015, 07/15/2015, 12/27/2015   Influenza Split 01/16/2012   Influenza Whole 01/31/2010   Influenza, High Dose Seasonal PF 01/28/2014, 12/29/2014, 02/17/2016, 01/21/2018   Influenza-Unspecified 12/29/2012, 01/21/2021   PFIZER(Purple Top)SARS-COV-2 Vaccination 05/16/2019, 06/06/2019, 02/06/2020, 02/05/2021   Pneumococcal Conjugate-13 06/14/2017   Pneumococcal Polysaccharide-23 01/28/2012, 01/28/2014   Pneumococcal-Unspecified 04/24/2015   Tdap 12/29/2014   Zoster Recombinat (Shingrix) 03/13/2017, 05/24/2017   Zoster, Live 02/14/2012    TDAP status: Up to date  Flu Vaccine status: Up to date  Pneumococcal vaccine status: Up to date  Covid-19 vaccine status: Completed vaccines  Qualifies for Shingles Vaccine? Yes   Zostavax completed Yes   Shingrix Completed?: Yes  Screening  Tests Health Maintenance  Topic Date Due   FOOT EXAM  04/08/2019   OPHTHALMOLOGY EXAM  08/29/2022 (Originally 08/29/2021)   Diabetic kidney evaluation - Urine ACR  08/30/2022 (Originally 11/21/2017)   COVID-19 Vaccine (5 - 2023-24 season) 09/14/2022 (Originally 12/22/2021)   HEMOGLOBIN A1C  11/14/2022   INFLUENZA VACCINE  11/22/2022   Diabetic kidney evaluation - eGFR measurement  07/24/2023   Medicare Annual Wellness (AWV)  08/29/2023   DTaP/Tdap/Td (2 - Td or Tdap) 12/28/2024   Pneumonia Vaccine 109+ Years old  Completed   DEXA SCAN  Completed   Hepatitis C Screening  Completed   Zoster Vaccines- Shingrix  Completed   HPV VACCINES  Aged Out   COLONOSCOPY (Pts 45-67yrs Insurance coverage will need to be confirmed)  Discontinued    Health Maintenance  Health Maintenance Due  Topic Date Due   FOOT EXAM  04/08/2019    Colorectal cancer screening: No longer required.   Mammogram status: No longer required due to Age.  Bone Density status: Completed 12/24/08. Results reflect: Bone density results: OSTEOPENIA. Repeat every   years.  Lung Cancer Screening: (Low Dose CT Chest recommended if Age 29-80 years, 30 pack-year currently smoking OR have quit w/in 15years.) does not qualify.    Additional Screening:  Hepatitis C Screening: does qualify; Completed 06/02/15  Vision Screening: Recommended annual ophthalmology exams for early detection of glaucoma and other disorders of the eye. Is the patient up to date with their annual eye exam?  Yes  Who is the provider or what is the name of the office in which the patient attends annual eye exams? Dr Dione Booze If pt is not established with a provider, would they like to be referred to a provider to establish  care? No .   Dental Screening: Recommended annual dental exams for proper oral hygiene  Community Resource Referral / Chronic Care Management:  CRR required this visit?  No   CCM required this visit?  No      Plan:     I have  personally reviewed and noted the following in the patient's chart:   Medical and social history Use of alcohol, tobacco or illicit drugs  Current medications and supplements including opioid prescriptions. Patient is currently taking opioid prescriptions. Information provided to patient regarding non-opioid alternatives. Patient advised to discuss non-opioid treatment plan with their provider. Functional ability and status Nutritional status Physical activity Advanced directives List of other physicians Hospitalizations, surgeries, and ER visits in previous 12 months Vitals Screenings to include cognitive, depression, and falls Referrals and appointments  In addition, I have reviewed and discussed with patient certain preventive protocols, quality metrics, and best practice recommendations. A written personalized care plan for preventive services as well as general preventive health recommendations were provided to patient.     Tillie Rung, LPN   4/[id number]/9811   Nurse Notes: Patient due Diabetic Kidney Evaluation

## 2022-08-31 ENCOUNTER — Ambulatory Visit: Payer: Medicare Other | Attending: Physician Assistant

## 2022-08-31 DIAGNOSIS — I5032 Chronic diastolic (congestive) heart failure: Secondary | ICD-10-CM

## 2022-08-31 DIAGNOSIS — I2510 Atherosclerotic heart disease of native coronary artery without angina pectoris: Secondary | ICD-10-CM | POA: Diagnosis not present

## 2022-08-31 LAB — BASIC METABOLIC PANEL
BUN/Creatinine Ratio: 29 — ABNORMAL HIGH (ref 12–28)
Potassium: 4 mmol/L (ref 3.5–5.2)
Sodium: 139 mmol/L (ref 134–144)

## 2022-08-31 LAB — PRO B NATRIURETIC PEPTIDE

## 2022-09-01 ENCOUNTER — Other Ambulatory Visit: Payer: Self-pay

## 2022-09-01 ENCOUNTER — Emergency Department (HOSPITAL_COMMUNITY): Payer: Medicare Other

## 2022-09-01 ENCOUNTER — Other Ambulatory Visit (HOSPITAL_BASED_OUTPATIENT_CLINIC_OR_DEPARTMENT_OTHER): Payer: Self-pay

## 2022-09-01 ENCOUNTER — Emergency Department (HOSPITAL_COMMUNITY)
Admission: EM | Admit: 2022-09-01 | Discharge: 2022-09-01 | Disposition: A | Discharge status: Home / Self Care | Payer: Medicare Other | Attending: Student | Admitting: Student

## 2022-09-01 ENCOUNTER — Encounter (HOSPITAL_COMMUNITY): Payer: Self-pay

## 2022-09-01 DIAGNOSIS — Z794 Long term (current) use of insulin: Secondary | ICD-10-CM | POA: Insufficient documentation

## 2022-09-01 DIAGNOSIS — I700 Atherosclerosis of aorta: Secondary | ICD-10-CM | POA: Diagnosis not present

## 2022-09-01 DIAGNOSIS — I5030 Unspecified diastolic (congestive) heart failure: Secondary | ICD-10-CM | POA: Insufficient documentation

## 2022-09-01 DIAGNOSIS — S0990XA Unspecified injury of head, initial encounter: Secondary | ICD-10-CM | POA: Insufficient documentation

## 2022-09-01 DIAGNOSIS — W01198A Fall on same level from slipping, tripping and stumbling with subsequent striking against other object, initial encounter: Secondary | ICD-10-CM | POA: Diagnosis not present

## 2022-09-01 DIAGNOSIS — I110 Hypertensive heart disease with heart failure: Secondary | ICD-10-CM | POA: Insufficient documentation

## 2022-09-01 DIAGNOSIS — S42032A Displaced fracture of lateral end of left clavicle, initial encounter for closed fracture: Secondary | ICD-10-CM | POA: Insufficient documentation

## 2022-09-01 DIAGNOSIS — E119 Type 2 diabetes mellitus without complications: Secondary | ICD-10-CM | POA: Insufficient documentation

## 2022-09-01 DIAGNOSIS — M25512 Pain in left shoulder: Secondary | ICD-10-CM | POA: Diagnosis present

## 2022-09-01 DIAGNOSIS — Y92000 Kitchen of unspecified non-institutional (private) residence as  the place of occurrence of the external cause: Secondary | ICD-10-CM | POA: Insufficient documentation

## 2022-09-01 DIAGNOSIS — M25572 Pain in left ankle and joints of left foot: Secondary | ICD-10-CM | POA: Diagnosis not present

## 2022-09-01 DIAGNOSIS — I2510 Atherosclerotic heart disease of native coronary artery without angina pectoris: Secondary | ICD-10-CM | POA: Diagnosis not present

## 2022-09-01 DIAGNOSIS — G20C Parkinsonism, unspecified: Secondary | ICD-10-CM | POA: Insufficient documentation

## 2022-09-01 DIAGNOSIS — Z8616 Personal history of COVID-19: Secondary | ICD-10-CM | POA: Diagnosis not present

## 2022-09-01 DIAGNOSIS — S199XXA Unspecified injury of neck, initial encounter: Secondary | ICD-10-CM | POA: Diagnosis not present

## 2022-09-01 DIAGNOSIS — E039 Hypothyroidism, unspecified: Secondary | ICD-10-CM | POA: Diagnosis not present

## 2022-09-01 DIAGNOSIS — I480 Paroxysmal atrial fibrillation: Secondary | ICD-10-CM | POA: Insufficient documentation

## 2022-09-01 DIAGNOSIS — Z79899 Other long term (current) drug therapy: Secondary | ICD-10-CM | POA: Insufficient documentation

## 2022-09-01 DIAGNOSIS — R064 Hyperventilation: Secondary | ICD-10-CM | POA: Diagnosis not present

## 2022-09-01 DIAGNOSIS — G4489 Other headache syndrome: Secondary | ICD-10-CM | POA: Diagnosis not present

## 2022-09-01 DIAGNOSIS — Z96612 Presence of left artificial shoulder joint: Secondary | ICD-10-CM | POA: Diagnosis not present

## 2022-09-01 DIAGNOSIS — W19XXXA Unspecified fall, initial encounter: Secondary | ICD-10-CM

## 2022-09-01 DIAGNOSIS — S4982XA Other specified injuries of left shoulder and upper arm, initial encounter: Secondary | ICD-10-CM | POA: Diagnosis not present

## 2022-09-01 LAB — BASIC METABOLIC PANEL
BUN: 25 mg/dL (ref 8–27)
CO2: 24 mmol/L (ref 20–29)
Calcium: 10.3 mg/dL (ref 8.7–10.3)
Chloride: 100 mmol/L (ref 96–106)
Creatinine, Ser: 0.87 mg/dL (ref 0.57–1.00)
Glucose: 144 mg/dL — ABNORMAL HIGH (ref 70–99)
eGFR: 68 mL/min/{1.73_m2} (ref >59)

## 2022-09-01 LAB — CBC WITH DIFFERENTIAL/PLATELET
Abs Immature Granulocytes: 0.03 10*3/uL (ref 0.00–0.07)
Basophils Absolute: 0.1 10*3/uL (ref 0.0–0.1)
Basophils Relative: 1 %
Eosinophils Absolute: 0.1 10*3/uL (ref 0.0–0.5)
Eosinophils Relative: 1 %
HCT: 34.3 % — ABNORMAL LOW (ref 36.0–46.0)
Hemoglobin: 10.6 g/dL — ABNORMAL LOW (ref 12.0–15.0)
Immature Granulocytes: 0 %
Lymphocytes Relative: 15 %
Lymphs Abs: 1.1 10*3/uL (ref 0.7–4.0)
MCH: 19.8 pg — ABNORMAL LOW (ref 26.0–34.0)
MCHC: 30.9 g/dL (ref 30.0–36.0)
MCV: 64.1 fL — ABNORMAL LOW (ref 80.0–100.0)
Monocytes Absolute: 0.5 10*3/uL (ref 0.1–1.0)
Monocytes Relative: 7 %
Neutro Abs: 5.3 10*3/uL (ref 1.7–7.7)
Neutrophils Relative %: 76 %
Platelets: 168 10*3/uL (ref 150–400)
RBC: 5.35 MIL/uL — ABNORMAL HIGH (ref 3.87–5.11)
RDW: 15.4 % (ref 11.5–15.5)
WBC: 7 10*3/uL (ref 4.0–10.5)
nRBC: 0 % (ref 0.0–0.2)

## 2022-09-01 LAB — COMPREHENSIVE METABOLIC PANEL
ALT: 31 U/L (ref 0–44)
AST: 28 U/L (ref 15–41)
Albumin: 3.9 g/dL (ref 3.5–5.0)
Alkaline Phosphatase: 79 U/L (ref 38–126)
Anion gap: 9 (ref 5–15)
BUN: 24 mg/dL — ABNORMAL HIGH (ref 8–23)
CO2: 22 mmol/L (ref 22–32)
Calcium: 9.7 mg/dL (ref 8.9–10.3)
Chloride: 100 mmol/L (ref 98–111)
Creatinine, Ser: 0.81 mg/dL (ref 0.44–1.00)
GFR, Estimated: >60 mL/min (ref >60)
Glucose, Bld: 189 mg/dL — ABNORMAL HIGH (ref 70–99)
Potassium: 3.5 mmol/L (ref 3.5–5.1)
Sodium: 131 mmol/L — ABNORMAL LOW (ref 135–145)
Total Bilirubin: 1.3 mg/dL — ABNORMAL HIGH (ref 0.3–1.2)
Total Protein: 7 g/dL (ref 6.5–8.1)

## 2022-09-01 LAB — CBG MONITORING, ED: Glucose-Capillary: 174 mg/dL — ABNORMAL HIGH (ref 70–99)

## 2022-09-01 MED ORDER — ACETAMINOPHEN 500 MG PO TABS
1000.0000 mg | ORAL_TABLET | Freq: Three times a day (TID) | ORAL | 0 refills | Status: AC
  Filled 2022-09-01: qty 180, 30d supply, fill #0

## 2022-09-01 MED ORDER — OXYCODONE HCL 5 MG PO TABS
5.0000 mg | ORAL_TABLET | Freq: Four times a day (QID) | ORAL | 0 refills | Status: DC | PRN
  Filled 2022-09-01: qty 14, 4d supply, fill #0

## 2022-09-01 MED ORDER — OXYCODONE HCL 5 MG PO TABS
5.0000 mg | ORAL_TABLET | Freq: Once | ORAL | Status: AC
  Administered 2022-09-01: 5 mg via ORAL
  Filled 2022-09-01: qty 1

## 2022-09-01 NOTE — Discharge Instructions (Signed)
For pain:  - Acetaminophen 1000 mg three times daily (every 8 hours) - oxycodone for breakthrough pain only 

## 2022-09-01 NOTE — ED Notes (Signed)
Pt tried to get up to wheelchair, noticed pain and tenderness to the left ankle. Provider notified.

## 2022-09-01 NOTE — ED Triage Notes (Signed)
BIBA from home for trip and fall. Pt c/o left shoulder, posterior head pain. 200 mcg Fentanyl given PTA

## 2022-09-01 NOTE — ED Provider Notes (Signed)
[place] EMERGENCY DEPARTMENT AT [facility] Provider Note  CSN: [id number] Arrival date & time: 09/01/22 1329  Chief Complaint(s) Fall  HPI Breanna Webster is a 77 y.o. female with PMH Parkinson's disease, CHF, HTN, HLD, previous CVA, Graves' disease, T2DM CAD on Eliquis who presents emergency department for evaluation of a fall.  Patient suffered a mechanical fall while in her kitchen today while reaching for her meds and landed on her left shoulder and head.  She states that she recently had an operation on the shoulder and the pain is severe.  She denies associated nausea, vomiting, confusion, numbness, tingling, weakness or other neurologic complaints.  Denies shortness of breath, abdominal pain, nausea, vomiting or other systemic or traumatic complaints.   Past Medical History Past Medical History:  Diagnosis Date   Anxiety    Arthritis    "back" (04/22/2018)   Back pain    Blood transfusion without reported diagnosis    CAD (coronary artery disease)    a. 03/2018 s/p PCI/DES to the RCA (3.0x15 Onyx DES).   Carotid artery stenosis    Mild   Chest pain    CHF (congestive heart failure) (HCC)    Chronic lower back pain    Cirrhosis (HCC)    Colon polyps    Diverticulitis    Diverticulosis    Esophageal thickening    seen on pre TAVR CT scan, also questionable cirrhosis. MRI recommended. Will refer to GI   GERD (gastroesophageal reflux disease)    Grave's disease    Heart murmur    History of colonic polyps 05/22/2017   History of hiatal hernia    Hyperlipidemia    Hypertension    Hypothyroidism    IBS (irritable bowel syndrome)    Osteopenia    Parkinson's syndrome 04/23/2020   Pulmonary nodules    seen on pre TAVR CT. likley benign. no follow up recommended if pt low risk.   S/P TAVR (transcatheter aortic valve replacement)    Shortness of breath on exertion    Stroke (HCC) 2021   x2   Thalassemia minor    Thyroid disease    Type II diabetes  [facility])    Patient Active Problem List   Diagnosis Date Noted   Type 2 diabetes mellitus with other specified complication, with long-term current use of insulin (HCC) 07/31/2022   Lymphedema of arm 07/31/2022   H/O total shoulder replacement, left 06/01/2022   Preoperative cardiovascular examination 02/09/2022   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HCC) 10/25/2021   Left hemiparesis (HCC) 06/21/2021   Other specified hereditary hemolytic anemias (HCC) 06/21/2021   Parkinsonism 06/21/2021   COVID-19 02/14/2021   Postviral fatigue syndrome 02/14/2021   Difficulty with adaptive servo-ventilation (ASV) use 02/14/2021   Sepsis secondary to UTI (HCC) 09/08/2020   Elevated ALT measurement 09/08/2020   History of CVA with residual deficit 09/08/2020   Hyperbilirubinemia 09/08/2020   Fatigue associated with anemia 08/03/2020   Cerebrovascular accident (CVA) due to embolism of right posterior cerebral artery (HCC) 08/03/2020   OSA treated with BiPAP 08/03/2020   Complex sleep apnea syndrome 08/03/2020   Treatment-emergent central sleep apnea 08/03/2020   Chronic intermittent hypoxia with obstructive sleep apnea 04/21/2020   OSA (obstructive sleep apnea) 04/21/2020   History of cardioembolic stroke 03/29/2020   Gait disturbance, post-stroke 03/29/2020   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 (HCC) 03/29/2020   Anxiety    RLQ abdominal pain 10/22/2019   Paroxysmal atrial fibrillation (HCC) 05/22/2019  Iron deficiency anemia 05/07/2019   Atrial fibrillation with RVR (HCC) 10/21/2018   Cerebellar stroke, acute (HCC) 10/21/2018   Streptococcal endocarditis    Endocarditis of mitral valve 10/07/2018   Bacteremia due to Streptococcus Salivarius 10/07/2018   Acute metabolic encephalopathy 10/04/2018   Severe aortic stenosis 07/08/2018   S/P TAVR (transcatheter aortic valve replacement) 07/08/2018   Esophageal thickening    Atherosclerotic heart disease of native  coronary artery with other forms of angina pectoris (HCC) 04/22/2018   Gastroesophageal reflux disease    Pulmonary hypertension (HCC) 02/21/2018   Essential hypertension 07/15/2017   History of colonic polyps 05/22/2017   Elevated liver function tests 12/05/2016   DOE (dyspnea on exertion) 07/19/2016   Thalassemia minor 05/29/2016   Left bundle branch block 12/06/2015   Upper airway cough syndrome 10/14/2015   (HFpEF) heart failure with preserved ejection fraction (HCC) 10/10/2015   Anemia, unspecified 01/31/2012   Diabetes mellitus due to underlying condition, uncontrolled 06/30/2010   Vitamin D deficiency 03/10/2009   Hypothyroidism 12/13/2008   Anxiety state 12/13/2008   Other specified disorders of bladder 12/13/2008   Home Medication(s) Prior to Admission medications   Medication Sig Start Date End Date Taking? Authorizing Provider  acetaminophen (TYLENOL) 500 MG tablet Take 500 mg by mouth every 6 (six) hours as needed for mild pain.    [provider]  apixaban (ELIQUIS) 5 MG TABS tablet Take 1 tablet (5 mg total) by mouth 2 (two) times daily. 01/02/22   Lyn Records, MD  b complex vitamins capsule Take 1 capsule by mouth daily.    [provider]  BD PEN NEEDLE NANO 2ND GEN 32G X 4 MM MISC  11/26/20   [provider]  cholecalciferol (VITAMIN D) 25 MCG (1000 UNIT) tablet Take 1,000 Units by mouth daily.    [provider]  Continuous Blood Gluc Receiver (FREESTYLE LIBRE READER) DEVI Use as directed 03/14/22     Continuous Blood Gluc Sensor (FREESTYLE LIBRE 3 SENSOR) MISC Place one sensor every 14 days 03/14/22     cyclobenzaprine (FLEXERIL) 5 MG tablet Take 1 tablet (5 mg total) by mouth every evening. 08/28/22     diltiazem (CARDIZEM CD) 120 MG 24 hr capsule Take 1 capsule (120 mg total) by mouth daily. 01/02/22   Lyn Records, MD  escitalopram (LEXAPRO) 20 MG tablet TAKE ONE TABLET BY MOUTH DAILY 06/15/22   Nafziger, Kandee Keen, NP  Evolocumab  (REPATHA SURECLICK) 140 MG/ML SOAJ Inject 1 dose into the skin every 14 (fourteen) days. 03/27/22   Lyn Records, MD  insulin NPH Human (HUMULIN N) 100 UNIT/ML injection Inject 20 units under the skin in the morning and 14 units in evening 02/20/22     Insulin NPH, Human,, Isophane, (HUMULIN N KWIKPEN) 100 UNIT/ML Kiwkpen Inject 20 units into the skin every morning and 14 units every evening. 02/20/22     Insulin Pen Needle (UNIFINE PENTIPS) 32G X 4 MM MISC Inject 4 times subcutaneously 06/23/21     insulin regular (HUMULIN R) 100 units/mL injection Inject 10 Units total into the skin 3 (three) times daily before meals. 02/20/22     Insulin Regular Human (NOVOLIN R FLEXPEN RELION) 100 UNIT/ML KwikPen Inject 5 to 10 units under the skin three times daily. 02/20/22     Insulin Syringe-Needle U-100 (ULTICARE INSULIN SYRINGE) 31G X 5/16" 1 ML MISC Inject 4 times daily subcutaneously 01/12/22   Nafziger, Kandee Keen, NP  metFORMIN (GLUCOPHAGE-XR) 500 MG 24 hr tablet Take 1  tablet by mouth 2 times daily 08/29/22     Multiple Vitamin (MULITIVITAMIN WITH MINERALS) TABS Take 1 tablet by mouth daily.    [provider]  nitroGLYCERIN (NITROSTAT) 0.4 MG SL tablet Place 1 tablet (0.4 mg total) under the tongue every 5 (five) minutes as needed for chest pain. 11/14/21   Lyn Records, MD  omeprazole (PRILOSEC) 20 MG capsule Take 1 capsule (20 mg total) by mouth daily. 06/07/22   [name], Willaim Rayas, MD  oxyCODONE-acetaminophen (PERCOCET) 5-325 MG tablet Take 1 tablet by mouth every 6 (six) hours. Patient not taking: Reported on 08/29/2022 07/05/22     potassium chloride SA (KLOR-CON M) 20 MEQ tablet Take 1 tablet (20 mEq total) by mouth twice daily for 3 days only, then decrease to taking 1 tablet (20 mEq total) by mouth daily thereafter.  Take in concurrence with torsemide. 06/26/22   Meriam Sprague, MD  pregabalin (LYRICA) 75 MG capsule Take 1 capsule (75 mg total) by mouth 2 (two) times daily. 07/05/22      primidone (MYSOLINE) 50 MG tablet Take 1 tablet (50 mg total) by mouth every morning. 07/31/22   Dohmeier, Porfirio Mylar, MD  SYNTHROID 137 MCG tablet Take 1 tablet (137 mcg total) by mouth every morning on empty stomach. 08/01/22     torsemide (DEMADEX) 20 MG tablet Take 1.5 tablets (30 mg total) by mouth daily. 08/10/22   Sharlene Dory, PA-C                                                                                                                                    Past Surgical History Past Surgical History:  Procedure Laterality Date   63 HOUR PH STUDY N/A 03/03/2018   Procedure: 24 HOUR PH STUDY;  Surgeon: Napoleon Form, MD;  Location: [facility] ENDOSCOPY;  Service: Endoscopy;  Laterality: N/A;   COLONOSCOPY  2023   COLONOSCOPY W/ BIOPSIES AND POLYPECTOMY     CORONARY ANGIOGRAPHY Right 04/21/2018   Procedure: CORONARY ANGIOGRAPHY (CATH LAB);  Surgeon: Lyn Records, MD;  Location: [facility] INVASIVE CV LAB;  Service: Cardiovascular;  Laterality: Right;   CORONARY STENT INTERVENTION N/A 04/22/2018   Procedure: CORONARY STENT INTERVENTION;  Surgeon: Lyn Records, MD;  Location: [facility] INVASIVE CV LAB;  Service: Cardiovascular;  Laterality: N/A;   DILATION AND CURETTAGE OF UTERUS     ESOPHAGEAL MANOMETRY N/A 03/03/2018   Procedure: ESOPHAGEAL MANOMETRY (EM);  Surgeon: Napoleon Form, MD;  Location: [facility] ENDOSCOPY;  Service: Endoscopy;  Laterality: N/A;   HYSTEROSCOPY     fibroids   LAPAROSCOPIC CHOLECYSTECTOMY  1985   LAPAROSCOPY     fibroids   NISSEN FUNDOPLICATION  1990s   POLYPECTOMY     REVERSE SHOULDER ARTHROPLASTY Left 06/01/2022   Procedure: REVERSE SHOULDER ARTHROPLASTY;  Surgeon: Beverely Low, MD;  Location: [facility] ORS;  Service: Orthopedics;  Laterality: Left;  choice with interscalene block   RIGHT/LEFT  HEART CATH AND CORONARY ANGIOGRAPHY N/A 02/20/2018   Procedure: RIGHT/LEFT HEART CATH AND CORONARY ANGIOGRAPHY;  Surgeon: Lyn Records, MD;  Location: [facility] INVASIVE CV LAB;  Service:  Cardiovascular;  Laterality: N/A;   TEE WITHOUT CARDIOVERSION N/A 07/08/2018   Procedure: TRANSESOPHAGEAL ECHOCARDIOGRAM (TEE);  Surgeon: Kathleene Hazel, MD;  Location: [facility] OR;  Service: Open Heart Surgery;  Laterality: N/A;   TEE WITHOUT CARDIOVERSION  10/07/2018   TEE WITHOUT CARDIOVERSION N/A 10/07/2018   Procedure: TRANSESOPHAGEAL ECHOCARDIOGRAM (TEE);  Surgeon: Jake Bathe, MD;  Location: [facility] ENDOSCOPY;  Service: Cardiovascular;  Laterality: N/A;   TONSILLECTOMY     age 89   TRANSCATHETER AORTIC VALVE REPLACEMENT, TRANSFEMORAL N/A 07/08/2018   Procedure: TRANSCATHETER AORTIC VALVE REPLACEMENT, TRANSFEMORAL;  Surgeon: Kathleene Hazel, MD;  Location: [facility] OR;  Service: Open Heart Surgery;  Laterality: N/A;   Family History Family History  Adopted: Yes  Problem Relation Age of Onset   Healthy Son        x 2   Headache Other        Cluster headaches   Heart failure Mother    Colon cancer Neg Hx    Pancreatic cancer Neg Hx    Rectal cancer Neg Hx    Stomach cancer Neg Hx     Social History Social History   Tobacco Use   Smoking status: Never    Passive exposure: Never   Smokeless tobacco: Never  Vaping Use   Vaping Use: Never used  Substance Use Topics   Alcohol use: No    Alcohol/week: 0.0 standard drinks of alcohol   Drug use: Never   Allergies Patient has no known allergies.  Review of Systems Review of Systems  Musculoskeletal:  Positive for arthralgias and myalgias.    Physical Exam Vital Signs  I have reviewed the triage vital signs There were no vitals taken for this visit.  Physical Exam Vitals and nursing note reviewed.  Constitutional:      General: She is not in acute distress.    Appearance: She is well-developed.  HENT:     Head: Normocephalic and atraumatic.  Eyes:     Conjunctiva/sclera: Conjunctivae normal.  Cardiovascular:     Rate and Rhythm: Normal rate and regular rhythm.     Heart sounds: No murmur heard. Pulmonary:      Effort: Pulmonary effort is normal. No respiratory distress.     Breath sounds: Normal breath sounds.  Abdominal:     Palpations: Abdomen is soft.     Tenderness: There is no abdominal tenderness.  Musculoskeletal:        General: Tenderness present. No swelling.     Cervical back: Neck supple.  Skin:    General: Skin is warm and dry.     Capillary Refill: Capillary refill takes less than 2 seconds.  Neurological:     Mental Status: She is alert.  Psychiatric:        Mood and Affect: Mood normal.     ED Results and Treatments Labs (all labs ordered are listed, but only abnormal results are displayed) Labs Reviewed - No data to display  Radiology No results found.  Pertinent labs & imaging results that were available during my care of the patient were reviewed by me and considered in my medical decision making (see MDM for details).  Medications Ordered in ED Medications - No data to display                                                                                                                                   Procedures Procedures  (including critical care time)  Medical Decision Making / ED Course   This patient presents to the ED for concern of fall, shoulder pain, this involves an extensive number of treatment options, and is a complaint that carries with it a high risk of complications and morbidity.  The differential diagnosis includes fracture, dislocation, contusion, ligamentous injury, closed head injury, ICH, SDH  MDM: Patient seen emergency room for evaluation of a fall with shoulder pain.  Physical exam with tenderness over the shoulder and lateral chest wall but is otherwise unremarkable.  No additional external signs of trauma.  Laboratory evaluation with a mild hyponatremia 131, hemoglobin 10.6 with an MCV of 64.1 but is otherwise  unremarkable.  Trauma imaging revealing left clavicle fracture but is otherwise unremarkable.  Patient placed in a sling and will follow-up outpatient with orthopedics.  Patient then discharged with outpatient follow-up.  She does not meet inpatient criteria for admission at this time.   Additional history obtained: -Additional history obtained from husband -External records from outside source obtained and reviewed including: Chart review including previous notes, labs, imaging, consultation notes    Imaging Studies ordered: I ordered imaging studies including CT head, C-spine, chest, shoulder x-ray I independently visualized and interpreted imaging. I agree with the radiologist interpretation   Medicines ordered and prescription drug management: No orders of the defined types were placed in this encounter.   -I have reviewed the patients home medicines and have made adjustments as needed  Critical interventions none    Cardiac Monitoring: The patient was maintained on a cardiac monitor.  I personally viewed and interpreted the cardiac monitored which showed an underlying rhythm of:  NSR  Social Determinants of Health:  Factors impacting patients care include: none   Reevaluation: After the interventions noted above, I reevaluated the patient and found that they have :improved  Co morbidities that complicate the patient evaluation  Past Medical History:  Diagnosis Date   Anxiety    Arthritis    "back" (04/22/2018)   Back pain    Blood transfusion without reported diagnosis    CAD (coronary artery disease)    a. 03/2018 s/p PCI/DES to the RCA (3.0x15 Onyx DES).   Carotid artery stenosis    Mild   Chest pain    CHF (congestive heart failure) (HCC)    Chronic lower back pain    Cirrhosis (HCC)    Colon polyps    Diverticulitis    Diverticulosis  Esophageal thickening    seen on pre TAVR CT scan, also questionable cirrhosis. MRI recommended. Will refer to GI    GERD (gastroesophageal reflux disease)    Grave's disease    Heart murmur    History of colonic polyps 05/22/2017   History of hiatal hernia    Hyperlipidemia    Hypertension    Hypothyroidism    IBS (irritable bowel syndrome)    Osteopenia    Parkinson's syndrome 04/23/2020   Pulmonary nodules    seen on pre TAVR CT. likley benign. no follow up recommended if pt low risk.   S/P TAVR (transcatheter aortic valve replacement)    Shortness of breath on exertion    Stroke [facility]) 2021   x2   Thalassemia minor    Thyroid disease    Type II diabetes mellitus (HCC)       Dispostion: I considered admission for this patient, but at this time she does not meet inpatient criteria for admission and is safe for discharge with outpatient orthopedic follow-up     Final Clinical Impression(s) / ED Diagnoses Final diagnoses:  None     @PCDICTATION @    Glendora Score, MD 09/01/22 1541

## 2022-09-04 ENCOUNTER — Telehealth: Payer: Self-pay

## 2022-09-04 ENCOUNTER — Ambulatory Visit: Payer: Self-pay

## 2022-09-04 DIAGNOSIS — S42032A Displaced fracture of lateral end of left clavicle, initial encounter for closed fracture: Secondary | ICD-10-CM | POA: Diagnosis not present

## 2022-09-04 NOTE — Transitions of Care (Post Inpatient/ED Visit) (Signed)
09/04/2022  Name: Breanna Webster MRN: [id number] DOB: 06/14/1945  Today's TOC FU Call Status: Today's TOC FU Call Status:: Successful TOC FU Call Competed TOC FU Call Complete Date: 09/04/22  Red on EMMI-ED Discharge Alert Date & Reason:09/03/22 "Scheduled follow-up appt? No"  Transition Care Management Follow-up Telephone Call Date of Discharge: 09/01/22 Discharge Facility: [facility]) Type of Discharge: Emergency Department Reason for ED Visit: Other: ('closed displaced fracture of acromial end of left clavicle,fall") How have you been since you were released from the hospital?: Same (pt states she is just returing from ortho appt. She has been told that it will take about 6wks for fracture to heal. She is taking Oxycodone and Tylenol prn to manage pain.) Any questions or concerns?: No  Items Reviewed: Did you receive and understand the discharge instructions provided?: Yes Medications obtained,verified, and reconciled?: Yes (Medications Reviewed) (partial med review-pt states she was in pain and did not feel liek getting up to get meds to review them and spouse currently not in the home-voices ortho MD reviewed meds with her at appt) Any new allergies since your discharge?: No Dietary orders reviewed?: Yes Type of Diet Ordered:: carb modified/heart healthy/low salt Do you have support at home?: Yes People in Home: spouse Name of Support/Comfort Primary Source: [name]  Medications Reviewed Today: Medications Reviewed Today     Reviewed by Suzie Portela, RN (Registered Nurse) on 09/01/22 at 1351  Med List Status: <None>   Medication Order Taking? Sig Documenting Provider Last Dose Status Informant  acetaminophen (TYLENOL) 500 MG tablet [id number]  Take 500 mg by mouth every 6 (six) hours as needed for mild pain. [provider]  Active Self  apixaban (ELIQUIS) 5 MG TABS tablet [id number]  Take 1 tablet (5 mg total) by mouth 2 (two) times daily. Lyn Records, MD   Active Self           Med Note (COX, CANDICE A   Tue Jul 10, 2022  2:47 PM)    b complex vitamins capsule [id number]  Take 1 capsule by mouth daily. [provider]  Active Self  BD PEN NEEDLE NANO 2ND GEN 32G X 4 MM MISC [id number]   [provider]  Active Self  cholecalciferol (VITAMIN D) 25 MCG (1000 UNIT) tablet [id number]  Take 1,000 Units by mouth daily. [provider]  Active Self  Continuous Blood Gluc Receiver (FREESTYLE LIBRE READER) DEVI [id number]  Use as directed   Active Self  Continuous Blood Gluc Sensor (FREESTYLE LIBRE 3 SENSOR) MISC [id number]  Place one sensor every 14 days   Active Self  cyclobenzaprine (FLEXERIL) 5 MG tablet [id number]  Take 1 tablet (5 mg total) by mouth every evening.   Active   diltiazem (CARDIZEM CD) 120 MG 24 hr capsule [id number]  Take 1 capsule (120 mg total) by mouth daily. Lyn Records, MD  Active Self  escitalopram (LEXAPRO) 20 MG tablet [id number]  TAKE ONE TABLET BY MOUTH DAILY Nafziger, Kandee Keen, NP  Active   Evolocumab (REPATHA SURECLICK) 140 MG/ML SOAJ [id number]  Inject 1 dose into the skin every 14 (fourteen) days. Lyn Records, MD  Active Self  insulin NPH Human (HUMULIN N) 100 UNIT/ML injection [id number]  Inject 20 units under the skin in the morning and 14 units in evening   Active Self           Med Note (COX, CANDICE A   Tue Jul 10, 2022  2:48 PM)  Insulin NPH, Human,, Isophane, (HUMULIN N KWIKPEN) 100 UNIT/ML Ulice Brilliant [id number]  Inject 20 units into the skin every morning and 14 units every evening.   Active Self  Insulin Pen Needle (UNIFINE PENTIPS) 32G X 4 MM MISC [id number]  Inject 4 times subcutaneously   Active Self  insulin regular (HUMULIN R) 100 units/mL injection [id number]  Inject 10 Units total into the skin 3 (three) times daily before meals.   Active Self           Med Note (COX, CANDICE A   Tue Jul 10, 2022  2:48 PM)    Insulin Regular Human (NOVOLIN R FLEXPEN RELION) 100 UNIT/ML KwikPen [id number]   Inject 5 to 10 units under the skin three times daily.   Active Self  Insulin Syringe-Needle U-100 (ULTICARE INSULIN SYRINGE) 31G X 5/16" 1 ML MISC [id number]  Inject 4 times daily subcutaneously Nafziger, Kandee Keen, NP  Active Self  metFORMIN (GLUCOPHAGE-XR) 500 MG 24 hr tablet [id number]  Take 1 tablet by mouth 2 times daily   Active   Multiple Vitamin (MULITIVITAMIN WITH MINERALS) TABS [id number]  Take 1 tablet by mouth daily. [provider]  Active Self  nitroGLYCERIN (NITROSTAT) 0.4 MG SL tablet [id number]  Place 1 tablet (0.4 mg total) under the tongue every 5 (five) minutes as needed for chest pain. Lyn Records, MD  Active Self           Med Note (COX, CANDICE A   Tue Jul 10, 2022  2:48 PM)    omeprazole (PRILOSEC) 20 MG capsule [id number]  Take 1 capsule (20 mg total) by mouth daily. Benancio Deeds, MD  Active   oxyCODONE-acetaminophen (PERCOCET) 5-325 MG tablet [id number]  Take 1 tablet by mouth every 6 (six) hours.  Patient not taking: Reported on 08/29/2022     Active   potassium chloride SA (KLOR-CON M) 20 MEQ tablet [id number]  Take 1 tablet (20 mEq total) by mouth twice daily for 3 days only, then decrease to taking 1 tablet (20 mEq total) by mouth daily thereafter.  Take in concurrence with torsemide. Meriam Sprague, MD  Active   pregabalin (LYRICA) 75 MG capsule [id number]  Take 1 capsule (75 mg total) by mouth 2 (two) times daily.   Active   primidone (MYSOLINE) 50 MG tablet [id number]  Take 1 tablet (50 mg total) by mouth every morning. Dohmeier, Porfirio Mylar, MD  Active   SYNTHROID 137 MCG tablet [id number]  Take 1 tablet (137 mcg total) by mouth every morning on empty stomach.   Active   torsemide (DEMADEX) 20 MG tablet [id number]  Take 1.5 tablets (30 mg total) by mouth daily. Sharlene Dory, PA-C  Active             Home Care and Equipment/Supplies: Were Home Health Services Ordered?: NA Any new equipment or medical supplies ordered?: NA  Functional Questionnaire: Do  you need assistance with bathing/showering or dressing?: Yes Do you need assistance with meal preparation?: Yes Do you need assistance with eating?: No Do you have difficulty maintaining continence: Yes Do you need assistance with getting out of bed/getting out of a chair/moving?: No Do you have difficulty managing or taking your medications?: No  Follow up appointments reviewed: PCP Follow-up appointment confirmed?: NA Specialist Hospital Follow-up appointment confirmed?: Yes Date of Specialist follow-up appointment?: 09/04/22 Follow-Up Specialty Provider:: Dr. Ranell Patrick Do you need transportation to your follow-up appointment?: No Do you understand care options if your condition(s) worsen?: Yes-patient verbalized understanding  SDOH  Interventions Today    Flowsheet Row Most Recent Value  SDOH Interventions   Food Insecurity Interventions Intervention Not Indicated      TOC Interventions Today    Flowsheet Row Most Recent Value  TOC Interventions   TOC Interventions Discussed/Reviewed TOC Interventions Discussed, Post discharge activity limitations per provider      Interventions Today    Flowsheet Row Most Recent Value  General Interventions   General Interventions Discussed/Reviewed General Interventions Discussed, Doctor Visits  Doctor Visits Discussed/Reviewed Doctor Visits Discussed, Specialist, PCP  PCP/Specialist Visits Compliance with follow-up visit  Education Interventions   Education Provided Provided Education  Provided Verbal Education On Nutrition, Medication, When to see the doctor, Other  [pain mgmt, bowel regimen]  Nutrition Interventions   Nutrition Discussed/Reviewed Nutrition Discussed, Adding fruits and vegetables, Decreasing sugar intake, Decreasing salt  Pharmacy Interventions   Pharmacy Dicussed/Reviewed Pharmacy Topics Discussed, Medications and their functions  Safety Interventions   Safety Discussed/Reviewed Safety Discussed, Fall Risk  [pt  states ortho MD during appt today recommended she start using wheelchair-given her shoulder and knee issues-spouse has gone to pick one up]       Antionette Fairy, RN,BSN,CCM [facility] Care Management Care Management Community Coordinator Direct Phone: [phone] Toll Free: [phone] Fax: [phone]

## 2022-09-04 NOTE — Chronic Care Management (AMB) (Signed)
   09/04/2022  Breanna Webster [birth year] [id number]  Reason for Encounter: Patient is not currently enrolled in the CCM program. CCM status changed to previously enrolled  Alto Denver RN, MSN, CCM RN Care Manager  Chronic Care Management Direct Number: [phone]

## 2022-09-06 DIAGNOSIS — N3941 Urge incontinence: Secondary | ICD-10-CM | POA: Diagnosis not present

## 2022-09-06 DIAGNOSIS — R350 Frequency of micturition: Secondary | ICD-10-CM | POA: Diagnosis not present

## 2022-09-06 NOTE — Progress Notes (Deleted)
Office Visit    Patient Name: Breanna Webster Date of Encounter: 09/06/2022  PCP:  Shirline Frees, NP   [facility]  Cardiologist:  Meriam Sprague, MD  Advanced Practice Provider:  No care team member to display Electrophysiologist:  None   HPI    [name] is a 77 y.o. 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  Patient was having shortness of breath with concern of diastolic heart failure was a component.  Difficulty taking Lasix.  Recommended SGLT2 therapy but she pushed back.  She was having a lot of pain and she was getting shots in her knee and shoulders which had not worked well.  Still having dyspnea on exertion.  Contemplated SGLT2 therapy but felt cost is prohibitive.  She was seen by me back in March , she states that she has had some dizziness.  She thinks it might be due to her low blood pressure.  She is borderline low today here in the clinic.  She does not currently have a blood pressure cuff but does have a wrist cuff.  I have encouraged her to get a new blood pressure cuff.  She does have some lower extremity edema left greater than right which is usual for her she has had extremity ultrasounds without DVT.  We have made some adjustments to her medications today to try to get her swelling down further.  I suggested lower extremity compression hose which she has tried in the past without success.  She was seen by me 08/10/2022, she she states that she is tired of constantly using the bathroom 8 or 9 times a day since  starting on the spironolactone.  Her legs do look a lot less swollen today.  She also tells me that she drinks several cups of water daily.  She is on a low to no salt diet per her husband.  She is requesting discontinuing spironolactone altogether. She also is worried about her low blood pressure today.   Today, she ***   Past Medical History    Past Medical History:  Diagnosis Date   Anxiety    Arthritis    "back" (04/22/2018)   Back pain    Blood transfusion without reported diagnosis    CAD (coronary artery disease)    a. 03/2018 s/p PCI/DES to the RCA (3.0x15 Onyx DES).   Carotid artery stenosis    Mild   Chest pain    CHF (congestive heart failure) (HCC)    Chronic lower back pain    Cirrhosis (HCC)    Colon polyps    Diverticulitis    Diverticulosis    Esophageal thickening    seen on pre TAVR CT scan, also questionable cirrhosis. MRI recommended. Will refer to GI   GERD (gastroesophageal reflux disease)    Grave's disease    Heart murmur    History of colonic polyps 05/22/2017   History of hiatal hernia    Hyperlipidemia    Hypertension    Hypothyroidism    IBS (irritable bowel syndrome)    Osteopenia    Parkinson's syndrome 04/23/2020   Pulmonary nodules  seen on pre TAVR CT. likley benign. no follow up recommended if pt low risk.   S/P TAVR (transcatheter aortic valve replacement)    Shortness of breath on exertion    Stroke (HCC) 2021   x2   Thalassemia minor    Thyroid disease    Type II diabetes mellitus (HCC)    Past Surgical History:  Procedure Laterality Date   105 HOUR PH STUDY N/A 03/03/2018   Procedure: 24 HOUR PH STUDY;  Surgeon: Napoleon Form, MD;  Location: [facility] ENDOSCOPY;  Service: Endoscopy;  Laterality: N/A;   COLONOSCOPY  2023   COLONOSCOPY W/ BIOPSIES AND POLYPECTOMY     CORONARY ANGIOGRAPHY Right 04/21/2018   Procedure: CORONARY ANGIOGRAPHY (CATH LAB);  Surgeon: Lyn Records, MD;  Location: [facility] INVASIVE CV LAB;  Service:  Cardiovascular;  Laterality: Right;   CORONARY STENT INTERVENTION N/A 04/22/2018   Procedure: CORONARY STENT INTERVENTION;  Surgeon: Lyn Records, MD;  Location: [facility] INVASIVE CV LAB;  Service: Cardiovascular;  Laterality: N/A;   DILATION AND CURETTAGE OF UTERUS     ESOPHAGEAL MANOMETRY N/A 03/03/2018   Procedure: ESOPHAGEAL MANOMETRY (EM);  Surgeon: Napoleon Form, MD;  Location: [facility] ENDOSCOPY;  Service: Endoscopy;  Laterality: N/A;   HYSTEROSCOPY     fibroids   LAPAROSCOPIC CHOLECYSTECTOMY  1985   LAPAROSCOPY     fibroids   NISSEN FUNDOPLICATION  1990s   POLYPECTOMY     REVERSE SHOULDER ARTHROPLASTY Left 06/01/2022   Procedure: REVERSE SHOULDER ARTHROPLASTY;  Surgeon: Beverely Low, MD;  Location: [facility] ORS;  Service: Orthopedics;  Laterality: Left;  choice with interscalene block   RIGHT/LEFT HEART CATH AND CORONARY ANGIOGRAPHY N/A 02/20/2018   Procedure: RIGHT/LEFT HEART CATH AND CORONARY ANGIOGRAPHY;  Surgeon: Lyn Records, MD;  Location: [facility] INVASIVE CV LAB;  Service: Cardiovascular;  Laterality: N/A;   TEE WITHOUT CARDIOVERSION N/A 07/08/2018   Procedure: TRANSESOPHAGEAL ECHOCARDIOGRAM (TEE);  Surgeon: Kathleene Hazel, MD;  Location: [facility] OR;  Service: Open Heart Surgery;  Laterality: N/A;   TEE WITHOUT CARDIOVERSION  10/07/2018   TEE WITHOUT CARDIOVERSION N/A 10/07/2018   Procedure: TRANSESOPHAGEAL ECHOCARDIOGRAM (TEE);  Surgeon: Jake Bathe, MD;  Location: [facility] ENDOSCOPY;  Service: Cardiovascular;  Laterality: N/A;   TONSILLECTOMY     age 89   TRANSCATHETER AORTIC VALVE REPLACEMENT, TRANSFEMORAL N/A 07/08/2018   Procedure: TRANSCATHETER AORTIC VALVE REPLACEMENT, TRANSFEMORAL;  Surgeon: Kathleene Hazel, MD;  Location: [facility] OR;  Service: Open Heart Surgery;  Laterality: N/A;    Allergies  No Known Allergies   EKGs/Labs/Other Studies Reviewed:   The following studies were reviewed today:  Echo 07/17/22  IMPRESSIONS     1. Left ventricular ejection fraction,  by estimation, is 60 to 65%. The  left ventricle has normal function. The left ventricle has no regional  wall motion abnormalities. Diastolic function is indeterminant due to  severe MAC.   2. Right ventricular systolic function was not well visualized. The right  ventricular size is not well visualized.   3. Left atrial size was moderately dilated.   4. The mitral valve is degenerative. Trivial mitral valve regurgitation.  Mild calcific mitral stenosis with mean gradient of at HR 68bpm.  Severe mitral annular calcification.   5. The aortic valve has been repaired/replaced. Aortic valve  regurgitation is not visualized. There is a 23 mm Sapien prosthetic (TAVR)  valve present in the aortic position. Procedure Date: 07/08/18. Echo  findings are consistent with normal structure and   function of  the aortic valve prosthesis. Aortic valve mean gradient  measures 16.3 mmHg. Aortic valve Vmax measures 2.64 m/s.   6. The inferior vena cava is normal in size with greater than 50%  respiratory variability, suggesting right atrial pressure of 3 mmHg.   Comparison(s): No significant change from prior study.   2D Doppler echocardiogram 2022: IMPRESSIONS   1. Left ventricular ejection fraction, by estimation, is 60 to 65%. The  left ventricle has normal function. The left ventricle has no regional  wall motion abnormalities. There is mild left ventricular hypertrophy.  Left ventricular diastolic parameters  are consistent with Grade I diastolic dysfunction (impaired relaxation).  The average left ventricular global longitudinal strain is -19.9 %. The  global longitudinal strain is normal.   2. Right ventricular systolic function is normal. The right ventricular  size is normal.   3. Left atrial size was moderately dilated.   4. The mitral valve is normal in structure. No evidence of mitral valve  regurgitation. Mild mitral stenosis. The mean mitral valve gradient is 4.5  mmHg. Severe  mitral annular calcification.   5. The aortic valve has been repaired/replaced. Aortic valve  regurgitation is not visualized. No aortic stenosis is present. There is a  23 mm Edwards Sapien prosthetic (TAVR) valve present in the aortic  position. Echo findings are consistent with normal   structure and function of the aortic valve prosthesis. Aortic valve area,  by VTI measures 1.99 cm. Aortic valve mean gradient measures 15.0 mmHg.  Aortic valve Vmax measures 2.43 m/s.   6. The inferior vena cava is normal in size with greater than 50%  respiratory variability, suggesting right atrial pressure of 3 mmHg.   Comparison(s): No significant change from prior study. Prior images  reviewed side by side.     EKG:  EKG is not ordered today.   Recent Labs: 07/24/2022: Magnesium 1.8 08/31/2022: NT-Pro BNP 511 09/01/2022: ALT 31; BUN 24; Creatinine, Ser 0.81; Hemoglobin 10.6; Platelets 168; Potassium 3.5; Sodium 131  Recent Lipid Panel    Component Value Date/Time   CHOL 106 09/06/2021 1018   CHOL 156 11/21/2016 1113   TRIG 101 09/06/2021 1018   HDL 50 09/06/2021 1018   HDL 44 11/21/2016 1113   CHOLHDL 2.1 09/06/2021 1018   VLDL 20 09/06/2021 1018   LDLCALC 36 09/06/2021 1018   LDLCALC 91 11/21/2016 1113   LDLDIRECT 98.0 05/24/2014 1343     Home Medications   No outpatient medications have been marked as taking for the 09/07/22 encounter (Appointment) with Sharlene Dory, PA-C.     Review of Systems      All other systems reviewed and are otherwise negative except as noted above.  Physical Exam    VS:  There were no vitals taken for this visit. , BMI There is no height or weight on file to calculate BMI.  Wt Readings from Last 3 Encounters:  08/29/22 176 lb 3.2 oz (79.9 kg)  08/10/22 175 lb (79.4 kg)  07/31/22 178 lb (80.7 kg)     GEN: Well nourished, well developed, in no acute distress. HEENT: normal. Neck: Supple, no JVD, carotid bruits, or masses. Cardiac: RRR, 4/6  systolic murmur, rubs, or gallops. No clubbing, cyanosis, edema.  Radials/PT 1+ and equal bilaterally.  Respiratory:  Respirations regular and unlabored, clear to auscultation bilaterally. GI: Soft, nontender, nondistended. MS: No deformity or atrophy. Skin: Warm and dry, no rash. Neuro:  Strength and sensation are intact. Psych: Normal affect.  Assessment &  Plan    Coronary artery disease -No current chest pain -Continue current medications which include Eliquis 5 mg twice daily, Cardizem CD 120 mg daily, Repatha 140 mg/mL every 2 weeks, Nitro as needed Demadex 20 mg daily-increased to 30mg , potassium 20 mEq daily  Paroxysmal atrial fibrillation -Normal sinus rhythm today, heart rate 81 bpm -Remains on Eliquis and Cardizem -Echocardiogram ordered  Chronic heart failure with preserved ejection fraction -She does not want to be on spirolactone any longer and she also does not want to go up to 40 mg of Demadex.  We compromised at 30 mg on her Demadex and to monitor for lower extremity edema. -edema improved -Compression hose recommended acute medications does not tolerate -Discontinue losartan to make more room for diuresis -Encouraged low-sodium diet -Encourage daily weights -BMP reviewed  AS status post TAVR -echo reviewed with the patient today  Hyperlipidemia -Most recent LDL 136, HDL 50, triglycerides 86 -Will be due for updated lipid panel at next visit  LBBB -stable, no EKG this visit  Hypertension -Low normal today, 98/58, some dizziness -We have discontinued her losartan to 25 mg daily and asked her to keep track of her blood pressure -We have asked her to obtain a new blood pressure cuff  No BP recorded.  {Refresh Note OR Click here to enter BP  :1}***      Disposition: Follow up 4 weeks with Meriam Sprague, MD or APP.  Signed, Sharlene Dory, PA-C 09/06/2022, 9:17 PM [facility]

## 2022-09-07 ENCOUNTER — Ambulatory Visit: Payer: Medicare Other

## 2022-09-07 ENCOUNTER — Ambulatory Visit: Payer: Medicare Other | Attending: Physician Assistant | Admitting: Physician Assistant

## 2022-09-07 ENCOUNTER — Ambulatory Visit: Payer: Medicare Other | Admitting: Hematology

## 2022-09-07 ENCOUNTER — Telehealth: Payer: Self-pay | Admitting: Nurse Practitioner

## 2022-09-07 ENCOUNTER — Other Ambulatory Visit: Payer: Medicare Other

## 2022-09-07 DIAGNOSIS — I2510 Atherosclerotic heart disease of native coronary artery without angina pectoris: Secondary | ICD-10-CM

## 2022-09-07 DIAGNOSIS — I10 Essential (primary) hypertension: Secondary | ICD-10-CM

## 2022-09-07 DIAGNOSIS — I480 Paroxysmal atrial fibrillation: Secondary | ICD-10-CM

## 2022-09-07 DIAGNOSIS — Z79899 Other long term (current) drug therapy: Secondary | ICD-10-CM

## 2022-09-07 DIAGNOSIS — Z952 Presence of prosthetic heart valve: Secondary | ICD-10-CM

## 2022-09-07 DIAGNOSIS — I447 Left bundle-branch block, unspecified: Secondary | ICD-10-CM

## 2022-09-07 DIAGNOSIS — I5032 Chronic diastolic (congestive) heart failure: Secondary | ICD-10-CM

## 2022-09-07 DIAGNOSIS — I350 Nonrheumatic aortic (valve) stenosis: Secondary | ICD-10-CM

## 2022-09-07 NOTE — Telephone Encounter (Signed)
Inbound call from patient spouse requesting to speak with a nurse in regards a sooner appt . Stated that his wife have been up all night crying , she is constipated and is having a severe discomfort and she can not wait until June to see a doctor ... He want to know what can he do to relieve wife pain.Please advise.

## 2022-09-10 ENCOUNTER — Encounter: Payer: Self-pay | Admitting: Physician Assistant

## 2022-09-11 ENCOUNTER — Other Ambulatory Visit (HOSPITAL_BASED_OUTPATIENT_CLINIC_OR_DEPARTMENT_OTHER): Payer: Self-pay

## 2022-09-11 DIAGNOSIS — H04123 Dry eye syndrome of bilateral lacrimal glands: Secondary | ICD-10-CM | POA: Diagnosis not present

## 2022-09-11 DIAGNOSIS — H34212 Partial retinal artery occlusion, left eye: Secondary | ICD-10-CM | POA: Diagnosis not present

## 2022-09-11 DIAGNOSIS — H2513 Age-related nuclear cataract, bilateral: Secondary | ICD-10-CM | POA: Diagnosis not present

## 2022-09-11 DIAGNOSIS — H532 Diplopia: Secondary | ICD-10-CM | POA: Diagnosis not present

## 2022-09-11 DIAGNOSIS — H5021 Vertical strabismus, right eye: Secondary | ICD-10-CM | POA: Diagnosis not present

## 2022-09-11 DIAGNOSIS — E119 Type 2 diabetes mellitus without complications: Secondary | ICD-10-CM | POA: Diagnosis not present

## 2022-09-12 ENCOUNTER — Telehealth: Payer: Self-pay | Admitting: Cardiology

## 2022-09-12 DIAGNOSIS — L820 Inflamed seborrheic keratosis: Secondary | ICD-10-CM | POA: Diagnosis not present

## 2022-09-12 DIAGNOSIS — L570 Actinic keratosis: Secondary | ICD-10-CM | POA: Diagnosis not present

## 2022-09-12 NOTE — Telephone Encounter (Signed)
Pt c/o swelling: STAT is pt has developed SOB within 24 hours  If swelling, where is the swelling located? Both legs swollen, left leg more swollen.   How much weight have you gained and in what time span? No   Have you gained 3 pounds in a day or 5 pounds in a week? no  Do you have a log of your daily weights (if so, list)? no  Are you currently taking a fluid pill? yes  Are you currently SOB? Spouse says today she is SOB   Have you traveled recently? No

## 2022-09-12 NOTE — Telephone Encounter (Signed)
Urgent call received in [facility] Triage office.   Pt husband stated Pt has edema in both lower extremities, left leg slightly more swollen.  Today while away from the house, in the heat, Pt had intermittent periods of shortness of breath.  Pt breathing better at home during time of call. With NO appointments available a [facility] on [address] available tomorrow / this week or into next week, and due to Pt being symptomatic, I advised Pt husband to take Pt to nearest ER for providers care. Pt and husband refused this advice, stating they went to the ER for care on 5/11, and not willing to take Pt there again tonight.  I strongly advised this, but checked all the [facility] street providers schedules, X3 and called back advising them NO appointments can be found.   I checked [facility] office, and saw Mr. Edd Fabian NP, had a 5/23 appointment open at 220pm.  Pt advised of this option, and were thankful to see Mr. Edd Fabian NP tomorrow at 220pm and accepted the appointment.  Appointment was scheduled, However, the ER was STRONGLY still advised for her symptoms and for WORSENING symptoms if the occur overnight.  ER visit should and must occur if her symptoms worsen.  Pt spouse verbalized understanding, and stated he would take her to the nearest ER should her symptoms worsen overnight.

## 2022-09-13 ENCOUNTER — Other Ambulatory Visit: Payer: Self-pay

## 2022-09-13 ENCOUNTER — Encounter: Payer: Self-pay | Admitting: General Practice

## 2022-09-13 ENCOUNTER — Ambulatory Visit: Payer: Medicare Other | Attending: General Practice | Admitting: General Practice

## 2022-09-13 VITALS — BP 104/60 | HR 73 | Ht 65.0 in | Wt 172.0 lb

## 2022-09-13 DIAGNOSIS — I480 Paroxysmal atrial fibrillation: Secondary | ICD-10-CM | POA: Insufficient documentation

## 2022-09-13 DIAGNOSIS — I350 Nonrheumatic aortic (valve) stenosis: Secondary | ICD-10-CM | POA: Insufficient documentation

## 2022-09-13 DIAGNOSIS — I5033 Acute on chronic diastolic (congestive) heart failure: Secondary | ICD-10-CM | POA: Insufficient documentation

## 2022-09-13 DIAGNOSIS — I2510 Atherosclerotic heart disease of native coronary artery without angina pectoris: Secondary | ICD-10-CM | POA: Diagnosis not present

## 2022-09-13 DIAGNOSIS — E785 Hyperlipidemia, unspecified: Secondary | ICD-10-CM | POA: Diagnosis not present

## 2022-09-13 NOTE — Patient Instructions (Addendum)
Medication Instructions:  Torsemide 60 mg daily for 3 days, then resume 30 mg daily. Potassium Chloride 20 mEq daily for 3 days, then resume 10 mEq daily.   *If you need a refill on your cardiac medications before your next appointment, please call your pharmacy*   Lab Work: BMET in 1 week.  If you have labs (blood work) drawn today and your tests are completely normal, you will receive your results only by: MyChart Message (if you have MyChart) OR A paper copy in the mail If you have any lab test that is abnormal or we need to change your treatment, we will call you to review the results.   Testing/Procedures: NONE ordered at this time of appointment     Follow-Up: At [facility], you and your health needs are our priority.  As part of our continuing mission to provide you with exceptional heart care, we have created designated Provider Care Teams.  These Care Teams include your primary Cardiologist (physician) and Advanced Practice Providers (APPs -  Physician Assistants and Nurse Practitioners) who all work together to provide you with the care you need, when you need it.  We recommend signing up for the patient portal called "MyChart".  Sign up information is provided on this After Visit Summary.  MyChart is used to connect with patients for Virtual Visits (Telemedicine).  Patients are able to view lab/test results, encounter notes, upcoming appointments, etc.  Non-urgent messages can be sent to your provider as well.   To learn more about what you can do with MyChart, go to [email or web address].    Your next appointment:   1-2 week(s)  Provider:   Jari Favre, PA-C      Edd Fabian, FNP        Other Instructions 48 ounce fluid restriction as discussed. Elevate legs while sitting or in the setting of swelling.  Daily Weight Record It is important to weigh yourself daily. To do this: Make sure you use a reliable scale. Use the same scale each day. Keep this  daily weight chart near your scale. Weigh yourself each morning at the same time after you use the bathroom. Before weighing yourself: Take off your shoes. Make sure you are wearing the same amount of clothing each day. Write down your weight in the spaces on the form. Compare today's weight to yesterday's weight. Bring this form with you to your follow-up visits with your health care provider. Call your health care provider if you have concerns about your weight, including rapid weight gain or loss. Date: ________ Weight: ____________________ Date: ________ Weight: ____________________ Date: ________ Weight: ____________________ Date: ________ Weight: ____________________ Date: ________ Weight: ____________________ Date: ________ Weight: ____________________ Date: ________ Weight: ____________________ Date: ________ Weight: ____________________ Date: ________ Weight: ____________________ Date: ________ Weight: ____________________ Date: ________ Weight: ____________________ Date: ________ Weight: ____________________ Date: ________ Weight: ____________________ Date: ________ Weight: ____________________ Date: ________ Weight: ____________________ Date: ________ Weight: ____________________ Date: ________ Weight: ____________________ Date: ________ Weight: ____________________ Date: ________ Weight: ____________________ Date: ________ Weight: ____________________ Date: ________ Weight: ____________________ Date: ________ Weight: ____________________ Date: ________ Weight: ____________________ Date: ________ Weight: ____________________ Date: ________ Weight: ____________________ Date: ________ Weight: ____________________ Date: ________ Weight: ____________________ Date: ________ Weight: ____________________ Date: ________ Weight: ____________________ Date: ________ Weight: ____________________ Date: ________ Weight: ____________________ Date: ________ Weight: ____________________ Date:  ________ Weight: ____________________ Date: ________ Weight: ____________________ Date: ________ Weight: ____________________ Date: ________ Weight: ____________________ Date: ________ Weight: ____________________ Date: ________ Weight: ____________________ Date: ________ Weight: ____________________ Date: ________ Weight: ____________________ Date:  ________ Weight: ____________________ Date: ________ Weight: ____________________ Date: ________ Weight: ____________________ Date: ________ Weight: ____________________ Date: ________ Weight: ____________________ Date: ________ Weight: ____________________ Date: ________ Weight: ____________________ Date: ________ Weight: ____________________ Date: ________ Weight: ____________________ Date: ________ Weight: ____________________ This information is not intended to replace advice given to you by your health care provider. Make sure you discuss any questions you have with your health care provider. Document Revised: 12/13/2020 Document Reviewed: 12/13/2020 Elsevier Patient Education  2024 [employer].

## 2022-09-13 NOTE — Progress Notes (Signed)
Cardiology Clinic Note   Patient Name: Breanna Webster Date of Encounter: 09/13/2022  Primary Care Provider:  Shirline Frees, NP Primary Cardiologist:  Meriam Sprague, MD  Patient Profile    Breanna Webster 77 year old female presents to the clinic today for evaluation of her intermittent shortness of breath and lower extremity swelling.  Past Medical History    Past Medical History:  Diagnosis Date   Anxiety    Arthritis    "back" (04/22/2018)   Back pain    Blood transfusion without reported diagnosis    CAD (coronary artery disease)    a. 03/2018 s/p PCI/DES to the RCA (3.0x15 Onyx DES).   Carotid artery stenosis    Mild   Chest pain    CHF (congestive heart failure) (HCC)    Chronic lower back pain    Cirrhosis (HCC)    Colon polyps    Diverticulitis    Diverticulosis    Esophageal thickening    seen on pre TAVR CT scan, also questionable cirrhosis. MRI recommended. Will refer to GI   GERD (gastroesophageal reflux disease)    Grave's disease    Heart murmur    History of colonic polyps 05/22/2017   History of hiatal hernia    Hyperlipidemia    Hypertension    Hypothyroidism    IBS (irritable bowel syndrome)    Osteopenia    Parkinson's syndrome 04/23/2020   Pulmonary nodules    seen on pre TAVR CT. likley benign. no follow up recommended if pt low risk.   S/P TAVR (transcatheter aortic valve replacement)    Shortness of breath on exertion    Stroke (HCC) 2021   x2   Thalassemia minor    Thyroid disease    Type II diabetes mellitus (HCC)    Past Surgical History:  Procedure Laterality Date   36 HOUR PH STUDY N/A 03/03/2018   Procedure: 24 HOUR PH STUDY;  Surgeon: Napoleon Form, MD;  Location: [facility] ENDOSCOPY;  Service: Endoscopy;  Laterality: N/A;   COLONOSCOPY  2023   COLONOSCOPY W/ BIOPSIES AND POLYPECTOMY     CORONARY ANGIOGRAPHY Right 04/21/2018   Procedure: CORONARY ANGIOGRAPHY (CATH LAB);  Surgeon: Lyn Records, MD;  Location: [facility]  INVASIVE CV LAB;  Service: Cardiovascular;  Laterality: Right;   CORONARY STENT INTERVENTION N/A 04/22/2018   Procedure: CORONARY STENT INTERVENTION;  Surgeon: Lyn Records, MD;  Location: [facility] INVASIVE CV LAB;  Service: Cardiovascular;  Laterality: N/A;   DILATION AND CURETTAGE OF UTERUS     ESOPHAGEAL MANOMETRY N/A 03/03/2018   Procedure: ESOPHAGEAL MANOMETRY (EM);  Surgeon: Napoleon Form, MD;  Location: [facility] ENDOSCOPY;  Service: Endoscopy;  Laterality: N/A;   HYSTEROSCOPY     fibroids   LAPAROSCOPIC CHOLECYSTECTOMY  1985   LAPAROSCOPY     fibroids   NISSEN FUNDOPLICATION  1990s   POLYPECTOMY     REVERSE SHOULDER ARTHROPLASTY Left 06/01/2022   Procedure: REVERSE SHOULDER ARTHROPLASTY;  Surgeon: Beverely Low, MD;  Location: [facility] ORS;  Service: Orthopedics;  Laterality: Left;  choice with interscalene block   RIGHT/LEFT HEART CATH AND CORONARY ANGIOGRAPHY N/A 02/20/2018   Procedure: RIGHT/LEFT HEART CATH AND CORONARY ANGIOGRAPHY;  Surgeon: Lyn Records, MD;  Location: [facility] INVASIVE CV LAB;  Service: Cardiovascular;  Laterality: N/A;   TEE WITHOUT CARDIOVERSION N/A 07/08/2018   Procedure: TRANSESOPHAGEAL ECHOCARDIOGRAM (TEE);  Surgeon: Kathleene Hazel, MD;  Location[facility] OR;  Service: Open Heart Surgery;  Laterality: N/A;   TEE  WITHOUT CARDIOVERSION  10/07/2018   TEE WITHOUT CARDIOVERSION N/A 10/07/2018   Procedure: TRANSESOPHAGEAL ECHOCARDIOGRAM (TEE);  Surgeon: Jake Bathe, MD;  Location: [facility] ENDOSCOPY;  Service: Cardiovascular;  Laterality: N/A;   TONSILLECTOMY     age 77   TRANSCATHETER AORTIC VALVE REPLACEMENT, TRANSFEMORAL N/A 07/08/2018   Procedure: TRANSCATHETER AORTIC VALVE REPLACEMENT, TRANSFEMORAL;  Surgeon: Kathleene Hazel, MD;  Location: [facility] OR;  Service: Open Heart Surgery;  Laterality: N/A;    Allergies  No Known Allergies  History of Present Illness    Breanna Webster has a PMH of CHF, hypertension, hyperlipidemia, Parkinson's, history of CVA,  Graves' disease, type 2 diabetes, coronary artery disease status post PCI with DES x 1 to mid RCA 12/19, mitral valve endocarditis 6/20, paroxysmal atrial fibrillation on apixaban, 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  She was seen in follow-up by Jari Favre, PA-C in March for dizziness.  It was felt that her symptoms were related to decreased blood pressure.  She was checking her blood pressure with a wrist cuff.  She was encouraged to obtain a new blood pressure cuff.  She was noted to have some lower extremity edema with left greater than right which is chronic.  Prior lower extremity ultrasounds negative for DVT.  Lower extremity compression stockings were recommended however, they were not previously successful.  She was seen again in follow-up by Jari Favre, PA-C on 08/10/2022.  She reported that she was frustrated with using the bathroom 8-9 times a day with spironolactone.  Her legs were showing less edema.  She reported that she was drinking several cups of water daily.  She was following a low-no salt diet.  She requested discontinuation of spironolactone.  She was also concerned about her lower blood pressure.  She denied shortness of breath and dyspnea on exertion.  She denied chest pain, orthopnea, PND, and palpitations.  Patient contacted nurse triage line 09/12/2022 and reported intermittent periods of shortness of breath and increased lower extremity swelling with left leg greater than right.  She presents to the clinic today for follow-up evaluation and states she continues to notice lower extremity swelling.  We reviewed the importance of fluid restriction, lower extremity support stockings, and elevating lower extremities to chest height.  She and her husband expressed understanding.  She reports that she has not been consuming extra fluid.  Her husband reports that she drinks several glasses of ice water daily.  Her blood  pressure today is 104/60.  Her EKG shows normal sinus rhythm with possible left atrial enlargement 73 bpm.  I will increase her torsemide to 60 mg for 3 days and then resume normal dosing.  We will increase her potassium to 20 mill equivalents for 3 days and then resume normal dosing.  I will order a BMP in 1 week, give 48 ounce fluid restriction and plan follow-up in 1 to 2 weeks.  Today she denies chest pain, fatigue, palpitations, melena, hematuria, hemoptysis, diaphoresis, weakness, presyncope, syncope, orthopnea, and PND.    Home Medications    Prior to Admission medications   Medication Sig Start Date End Date Taking? Authorizing Provider  acetaminophen (TYLENOL) 500 MG tablet Take 500 mg by mouth every 6 (six) hours as needed for mild pain.    [provider]  acetaminophen (TYLENOL) 500 MG tablet Take 2 tablets (1,000 mg total) by mouth every 8 (eight) hours. 09/01/22 10/01/22  Kommor, Wyn Forster, MD  apixaban Everlene Balls) 5  MG TABS tablet Take 1 tablet (5 mg total) by mouth 2 (two) times daily. 01/02/22   Lyn Records, MD  b complex vitamins capsule Take 1 capsule by mouth daily.    [provider]  BD PEN NEEDLE NANO 2ND GEN 32G X 4 MM MISC  11/26/20   [provider]  cholecalciferol (VITAMIN D) 25 MCG (1000 UNIT) tablet Take 1,000 Units by mouth daily.    [provider]  Continuous Blood Gluc Receiver (FREESTYLE LIBRE READER) DEVI Use as directed 03/14/22     Continuous Blood Gluc Sensor (FREESTYLE LIBRE 3 SENSOR) MISC Place one sensor every 14 days 03/14/22     cyclobenzaprine (FLEXERIL) 5 MG tablet Take 1 tablet (5 mg total) by mouth every evening. 08/28/22     diltiazem (CARDIZEM CD) 120 MG 24 hr capsule Take 1 capsule (120 mg total) by mouth daily. 01/02/22   Lyn Records, MD  escitalopram (LEXAPRO) 20 MG tablet TAKE ONE TABLET BY MOUTH DAILY 06/15/22   Nafziger, Kandee Keen, NP  Evolocumab (REPATHA SURECLICK) 140 MG/ML SOAJ Inject 1 dose into the skin every 14  (fourteen) days. 03/27/22   Lyn Records, MD  insulin NPH Human (HUMULIN N) 100 UNIT/ML injection Inject 20 units under the skin in the morning and 14 units in evening 02/20/22     Insulin NPH, Human,, Isophane, (HUMULIN N KWIKPEN) 100 UNIT/ML Kiwkpen Inject 20 units into the skin every morning and 14 units every evening. 02/20/22     Insulin Pen Needle (UNIFINE PENTIPS) 32G X 4 MM MISC Inject 4 times subcutaneously 06/23/21     insulin regular (HUMULIN R) 100 units/mL injection Inject 10 Units total into the skin 3 (three) times daily before meals. 02/20/22     Insulin Regular Human (NOVOLIN R FLEXPEN RELION) 100 UNIT/ML KwikPen Inject 5 to 10 units under the skin three times daily. 02/20/22     Insulin Syringe-Needle U-100 (ULTICARE INSULIN SYRINGE) 31G X 5/16" 1 ML MISC Inject 4 times daily subcutaneously 01/12/22   Shirline Frees, NP  metFORMIN (GLUCOPHAGE-XR) 500 MG 24 hr tablet Take 1 tablet by mouth 2 times daily 08/29/22     Multiple Vitamin (MULITIVITAMIN WITH MINERALS) TABS Take 1 tablet by mouth daily.    [provider]  nitroGLYCERIN (NITROSTAT) 0.4 MG SL tablet Place 1 tablet (0.4 mg total) under the tongue every 5 (five) minutes as needed for chest pain. 11/14/21   Lyn Records, MD  omeprazole (PRILOSEC) 20 MG capsule Take 1 capsule (20 mg total) by mouth daily. 06/07/22   [name], Willaim Rayas, MD  oxyCODONE (ROXICODONE) 5 MG immediate release tablet Take 1 tablet (5 mg total) by mouth every 6 (six) hours as needed for breakthrough pain. 09/01/22   Kommor, [place], MD  polyethylene glycol (MIRALAX / GLYCOLAX) 17 g packet Take 17 g by mouth daily as needed for mild constipation.    [provider]  potassium chloride SA (KLOR-CON M) 20 MEQ tablet Take 1 tablet (20 mEq total) by mouth twice daily for 3 days only, then decrease to taking 1 tablet (20 mEq total) by mouth daily thereafter.  Take in concurrence with torsemide. 06/26/22   Meriam Sprague, MD  pregabalin (LYRICA)  75 MG capsule Take 1 capsule (75 mg total) by mouth 2 (two) times daily. 07/05/22     primidone (MYSOLINE) 50 MG tablet Take 1 tablet (50 mg total) by mouth every morning. 07/31/22   Dohmeier, Porfirio Mylar, MD  SYNTHROID 137 MCG  tablet Take 1 tablet (137 mcg total) by mouth every morning on empty stomach. 08/01/22     torsemide (DEMADEX) 20 MG tablet Take 1.5 tablets (30 mg total) by mouth daily. 08/10/22   Sharlene Dory, PA-C    Family History    Family History  Adopted: Yes  Problem Relation Age of Onset   Healthy Son        x 2   Headache Other        Cluster headaches   Heart failure Mother    Colon cancer Neg Hx    Pancreatic cancer Neg Hx    Rectal cancer Neg Hx    Stomach cancer Neg Hx    is adopted.   Social History    Social History   Socioeconomic History   Marital status: Married    Spouse name: Not on file   Number of children: 2   Years of education: Not on file   Highest education level: Not on file  Occupational History   Occupation: Health and safety inspector of the [employer]  Tobacco Use   Smoking status: Never    Passive exposure: Never   Smokeless tobacco: Never  Vaping Use   Vaping Use: Never used  Substance and Sexual Activity   Alcohol use: No    Alcohol/week: 0.0 standard drinks of alcohol   Drug use: Never   Sexual activity: Not Currently  Other Topics Concern   Not on file  Social History Narrative   Lives with husband in a one story home.     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   Education: college.   Social Determinants of Health   Financial Resource Strain: Low Risk  (08/29/2022)   Overall Financial Resource Strain (CARDIA)    Difficulty of Paying Living Expenses: Not hard at all  Food Insecurity: No Food Insecurity (09/04/2022)   Hunger Vital Sign    Worried About Running Out of Food in the Last Year: Never true    Ran Out of Food in the Last Year: Never true   Transportation Needs: No Transportation Needs (09/04/2022)   PRAPARE - Administrator, Civil Service (Medical): No    Lack of Transportation (Non-Medical): No  Physical Activity: Insufficiently Active (08/29/2022)   Exercise Vital Sign    Days of Exercise per Week: 2 days    Minutes of Exercise per Session: 40 min  Stress: No Stress Concern Present (08/29/2022)   [employer] of Occupational Health - Occupational Stress Questionnaire    Feeling of Stress : Not at all  Social Connections: Socially Integrated (08/29/2022)   Social Connection and Isolation Panel [NHANES]    Frequency of Communication with Friends and Family: More than three times a week    Frequency of Social Gatherings with Friends and Family: More than three times a week    Attends Religious Services: More than 4 times per year    Active Member of [employer] or Organizations: Yes    Attends Engineer, structural: More than 4 times per year    Marital Status: Married  Catering manager Violence: Not At Risk (08/29/2022)   Humiliation, Afraid, Rape, and Kick questionnaire    Fear of Current or Ex-Partner: No    Emotionally Abused: No    Physically Abused: No    Sexually Abused: No     Review of Systems    General:  No chills, fever, night  sweats or weight changes.  Cardiovascular:  No chest pain, dyspnea on exertion, edema, orthopnea, palpitations, paroxysmal nocturnal dyspnea. Dermatological: No rash, lesions/masses Respiratory: No cough, dyspnea Urologic: No hematuria, dysuria Abdominal:   No nausea, vomiting, diarrhea, bright red blood per rectum, melena, or hematemesis Neurologic:  No visual changes, wkns, changes in mental status. All other systems reviewed and are otherwise negative except as noted above.  Physical Exam    VS:  BP 104/60 (BP Location: Right Arm, Patient Position: Sitting, Cuff Size: Large)   Pulse 73   Ht 5\' 5"  (1.651 m)   Wt 172 lb (78 kg)   SpO2 95%   BMI 28.62 kg/m  ,  BMI Body mass index is 28.62 kg/m. GEN: Well nourished, well developed, in no acute distress. HEENT: normal. Neck: Supple, no JVD, carotid bruits, or masses. Cardiac: RRR, no murmurs, rubs, or gallops. No clubbing, cyanosis, bilateral lower extremity 2+ pitting edema.  Radials/DP/PT 2+ and equal bilaterally.  Respiratory:  Respirations regular and unlabored, clear to auscultation bilaterally. GI: Soft, nontender, nondistended, BS + x 4. MS: no deformity or atrophy. Skin: warm and dry, no rash. Neuro:  Strength and sensation are intact. Psych: Normal affect.  Accessory Clinical Findings    Recent Labs: 07/24/2022: Magnesium 1.8 08/31/2022: NT-Pro BNP 511 09/01/2022: ALT 31; BUN 24; Creatinine, Ser 0.81; Hemoglobin 10.6; Platelets 168; Potassium 3.5; Sodium 131   Recent Lipid Panel    Component Value Date/Time   CHOL 106 09/06/2021 1018   CHOL 156 11/21/2016 1113   TRIG 101 09/06/2021 1018   HDL 50 09/06/2021 1018   HDL 44 11/21/2016 1113   CHOLHDL 2.1 09/06/2021 1018   VLDL 20 09/06/2021 1018   LDLCALC 36 09/06/2021 1018   LDLCALC 91 11/21/2016 1113   LDLDIRECT 98.0 05/24/2014 1343         ECG personally reviewed by me today-normal sinus rhythm possible left atrial enlargement left axis deviation left bundle branch block 73 bpm- No acute changes   Echocardiogram 07/17/2022  IMPRESSIONS     1. Left ventricular ejection fraction, by estimation, is 60 to 65%. The  left ventricle has normal function. The left ventricle has no regional  wall motion abnormalities. Diastolic function is indeterminant due to  severe MAC.   2. Right ventricular systolic function was not well visualized. The right  ventricular size is not well visualized.   3. Left atrial size was moderately dilated.   4. The mitral valve is degenerative. Trivial mitral valve regurgitation.  Mild calcific mitral stenosis with mean gradient of at HR 68bpm.  Severe mitral annular calcification.   5. The  aortic valve has been repaired/replaced. Aortic valve  regurgitation is not visualized. There is a 23 mm Sapien prosthetic (TAVR)  valve present in the aortic position. Procedure Date: 07/08/18. Echo  findings are consistent with normal structure and   function of the aortic valve prosthesis. Aortic valve mean gradient  measures 16.3 mmHg. Aortic valve Vmax measures 2.64 m/s.   6. The inferior vena cava is normal in size with greater than 50%  respiratory variability, suggesting right atrial pressure of 3 mmHg.   Comparison(s): No significant change from prior study.    2D Doppler echocardiogram 2022: IMPRESSIONS   1. Left ventricular ejection fraction, by estimation, is 60 to 65%. The  left ventricle has normal function. The left ventricle has no regional  wall motion abnormalities. There is mild left ventricular hypertrophy.  Left ventricular diastolic parameters  are consistent with  Grade I diastolic dysfunction (impaired relaxation).  The average left ventricular global longitudinal strain is -19.9 %. The  global longitudinal strain is normal.   2. Right ventricular systolic function is normal. The right ventricular  size is normal.   3. Left atrial size was moderately dilated.   4. The mitral valve is normal in structure. No evidence of mitral valve  regurgitation. Mild mitral stenosis. The mean mitral valve gradient is 4.5  mmHg. Severe mitral annular calcification.   5. The aortic valve has been repaired/replaced. Aortic valve  regurgitation is not visualized. No aortic stenosis is present. There is a  23 mm Edwards Sapien prosthetic (TAVR) valve present in the aortic  position. Echo findings are consistent with normal   structure and function of the aortic valve prosthesis. Aortic valve area,  by VTI measures 1.99 cm. Aortic valve mean gradient measures 15.0 mmHg.  Aortic valve Vmax measures 2.43 m/s.   6. The inferior vena cava is normal in size with greater than 50%   respiratory variability, suggesting right atrial pressure of 3 mmHg.   Comparison(s): No significant change from prior study. Prior images  reviewed side by side.   Assessment & Plan   1.  Acute on chronic diastolic CHF -weight today 172 lbs.  2+ bilateral lower extremity pitting edema left greater than right. Increase torsemide to 60 mg x 3 days and then return to 30 mg dosing Increase potassium to 40 mg x 3 days then return to 20 mg daily dosing Fluid restriction-48 ounces or less daily Daily weights-contact office with a weight increase of 2 to 3 pounds overnight or 5 pounds in 1 week Elevate lower extremities when not active Lower extremity support stockings Order BMP in 1 week  Coronary artery disease-no chest pain today.  Underwent cardiac catheterization with PCI and DES to mid RCA 12/19. Continue current medical therapy Low-sodium diet  Paroxysmal atrial fibrillation-heart rate today 104/60.  Reports compliance with apixaban.  Denies bleeding issues. Continue apixaban, diltiazem  Hyperlipidemia- compliant with repatha. Continue Repatha High-fiber diet Increase physical activity as tolerated  Aortic stenosis-status post TAVR.  Well-functioning on 07/17/2022 echocardiogram.  Disposition: Follow-up with Dr. Shari Prows or APP in 1-2 week.    Thomasene Ripple. Jashon Ishida NP-C     09/13/2022, 4:36 PM [facility] [address] Office [phone] Fax [phone]    I spent 14 minutes examining this patient, reviewing medications, and using patient centered shared decision making involving her cardiac care.  Prior to her visit I spent greater than 20 minutes reviewing her past medical history,  medications, and prior cardiac tests.

## 2022-09-14 ENCOUNTER — Other Ambulatory Visit (HOSPITAL_BASED_OUTPATIENT_CLINIC_OR_DEPARTMENT_OTHER): Payer: Self-pay

## 2022-09-18 ENCOUNTER — Other Ambulatory Visit (HOSPITAL_BASED_OUTPATIENT_CLINIC_OR_DEPARTMENT_OTHER): Payer: Self-pay

## 2022-09-18 DIAGNOSIS — I5033 Acute on chronic diastolic (congestive) heart failure: Secondary | ICD-10-CM | POA: Diagnosis not present

## 2022-09-18 DIAGNOSIS — E785 Hyperlipidemia, unspecified: Secondary | ICD-10-CM | POA: Diagnosis not present

## 2022-09-18 DIAGNOSIS — I2510 Atherosclerotic heart disease of native coronary artery without angina pectoris: Secondary | ICD-10-CM | POA: Diagnosis not present

## 2022-09-18 DIAGNOSIS — I350 Nonrheumatic aortic (valve) stenosis: Secondary | ICD-10-CM | POA: Diagnosis not present

## 2022-09-18 DIAGNOSIS — I480 Paroxysmal atrial fibrillation: Secondary | ICD-10-CM | POA: Diagnosis not present

## 2022-09-18 NOTE — Progress Notes (Unsigned)
Cardiology Clinic Note   Patient Name: Breanna Webster Date of Encounter: 09/19/2022  Primary Care Provider:  Shirline Frees, NP Primary Cardiologist:  Meriam Sprague, MD  Patient Profile    Breanna Webster 77 year old female presents to the clinic today for evaluation of her intermittent shortness of breath and lower extremity swelling.  Past Medical History    Past Medical History:  Diagnosis Date   Anxiety    Arthritis    "back" (04/22/2018)   Back pain    Blood transfusion without reported diagnosis    CAD (coronary artery disease)    a. 03/2018 s/p PCI/DES to the RCA (3.0x15 Onyx DES).   Carotid artery stenosis    Mild   Chest pain    CHF (congestive heart failure) (HCC)    Chronic lower back pain    Cirrhosis (HCC)    Colon polyps    Diverticulitis    Diverticulosis    Esophageal thickening    seen on pre TAVR CT scan, also questionable cirrhosis. MRI recommended. Will refer to GI   GERD (gastroesophageal reflux disease)    Grave's disease    Heart murmur    History of colonic polyps 05/22/2017   History of hiatal hernia    Hyperlipidemia    Hypertension    Hypothyroidism    IBS (irritable bowel syndrome)    Osteopenia    Parkinson's syndrome 04/23/2020   Pulmonary nodules    seen on pre TAVR CT. likley benign. no follow up recommended if pt low risk.   S/P TAVR (transcatheter aortic valve replacement)    Shortness of breath on exertion    Stroke (HCC) 2021   x2   Thalassemia minor    Thyroid disease    Type II diabetes mellitus (HCC)    Past Surgical History:  Procedure Laterality Date   47 HOUR PH STUDY N/A 03/03/2018   Procedure: 24 HOUR PH STUDY;  Surgeon: Napoleon Form, MD;  Location: [facility] ENDOS[facility];  Service: Endoscopy;  Laterality: N/A;   COLONOSCOPY  2023   COLONOSCOPY W/ BIOPSIES AND POLYPECTOMY     CORONARY ANGIOGRAPHY Right 04/21/2018   Procedure: CORONARY ANGIOGRAPHY (CATH LAB);  Surgeon: Lyn Records, MD;  Location: [facility]  INVASIVE CV LAB;  Service: Cardiovascular;  Laterality: Right;   CORONARY STENT INTERVENTION N/A 04/22/2018   Procedure: CORONARY STENT INTERVENTION;  Surgeon: Lyn Records, MD;  Location: [facility] INVASIVE CV LAB;  Service: Cardiovascular;  Laterality: N/A;   DILATION AND CURETTAGE OF UTERUS     ESOPHAGEAL MANOMETRY N/A 03/03/2018   Procedure: ESOPHAGEAL MANOMETRY (EM);  Surgeon: Napoleon Form, MD;  Location: [facility] ENDOS[facility];  Service: Endoscopy;  Laterality: N/A;   HYSTEROSCOPY     fibroids   LAPAROSCOPIC CHOLECYSTECTOMY  1985   LAPAROSCOPY     fibroids   NISSEN FUNDOPLICATION  1990s   POLYPECTOMY     REVERSE SHOULDER ARTHROPLASTY Left 06/01/2022   Procedure: REVERSE SHOULDER ARTHROPLASTY;  Surgeon: Beverely Low, MD;  Location: [facility] ORS;  Service: Orthopedics;  Laterality: Left;  choice with interscalene block   RIGHT/LEFT HEART CATH AND CORONARY ANGIOGRAPHY N/A 02/20/2018   Procedure: RIGHT/LEFT HEART CATH AND CORONARY ANGIOGRAPHY;  Surgeon: Lyn Records, MD;  Location: [facility] INVASIVE CV LAB;  Service: Cardiovascular;  Laterality: N/A;   TEE WITHOUT CARDIOVERSION N/A 07/08/2018   Procedure: TRANSESOPHAGEAL ECHOCARDIOGRAM (TEE);  Surgeon: Kathleene Hazel, MD;  Location: [facility] OR;  Service: Open Heart Surgery;  Laterality: N/A;   TEE  WITHOUT CARDIOVERSION  10/07/2018   TEE WITHOUT CARDIOVERSION N/A 10/07/2018   Procedure: TRANSESOPHAGEAL ECHOCARDIOGRAM (TEE);  Surgeon: Jake Bathe, MD;  Location: [facility] ENDOSCOPY;  Service: Cardiovascular;  Laterality: N/A;   TONSILLECTOMY     age 77   TRANSCATHETER AORTIC VALVE REPLACEMENT, TRANSFEMORAL N/A 07/08/2018   Procedure: TRANSCATHETER AORTIC VALVE REPLACEMENT, TRANSFEMORAL;  Surgeon: Kathleene Hazel, MD;  Location: [facility] OR;  Service: Open Heart Surgery;  Laterality: N/A;    Allergies  No Known Allergies  History of Present Illness    Breanna Webster has a PMH of CHF, hypertension, hyperlipidemia, Parkinson's, history of CVA,  Graves' disease, type 2 diabetes, coronary artery disease status post PCI with DES x 1 to mid RCA 12/19, mitral valve endocarditis 6/20, paroxysmal atrial fibrillation on apixaban, 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  She was seen in follow-up by Jari Favre, PA-C in March for dizziness.  It was felt that her symptoms were related to decreased blood pressure.  She was checking her blood pressure with a wrist cuff.  She was encouraged to obtain a new blood pressure cuff.  She was noted to have some lower extremity edema with left greater than right which is chronic.  Prior lower extremity ultrasounds negative for DVT.  Lower extremity compression stockings were recommended however, they were not previously successful.  She was seen again in follow-up by Jari Favre, PA-C on 08/10/2022.  She reported that she was frustrated with using the bathroom 8-9 times a day with spironolactone.  Her legs were showing less edema.  She reported that she was drinking several cups of water daily.  She was following a low-no salt diet.  She requested discontinuation of spironolactone.  She was also concerned about her lower blood pressure.  She denied shortness of breath and dyspnea on exertion.  She denied chest pain, orthopnea, PND, and palpitations.  Patient contacted nurse triage line 09/12/2022 and reported intermittent periods of shortness of breath and increased lower extremity swelling with left leg greater than right.  She presented to the clinic 09/13/2022 for follow-up evaluation and stated she continues to notice lower extremity swelling.  We reviewed the importance of fluid restriction, lower extremity support stockings, and elevating lower extremities to chest height.  She and her husband expressed understanding.  She reported that she had not been consuming extra fluid.  Her husband reported  that she drank several glasses of ice water daily.  Her  blood pressure was 104/60.  Her EKG showed normal sinus rhythm with possible left atrial enlargement 73 bpm.  I  increased her torsemide to 60 mg for 3 days and then planned to resume normal dosing.  I increased her potassium to 20 mill equivalents for 3 days.  I planned BMP in 1 week, gave 48 ounce fluid restriction and planned follow-up in 1 to 2 weeks.  She presents to the clinic today for follow-up evaluation and states she tolerated the medication well.  Her weight is unchanged.  She reports that she did not weigh by scale in the clinic last week.  She reports not measuring fluid intake.  We reviewed the importance of fluid restriction and lower extremity support stockings.  I will repeat increased diuresis and increase potassium.  I will add Zaroxolyn 2.5 mg for 2 days and repeat BMP in 1 week.  Will plan follow-up in 2 weeks.  She and her husband expressed understanding.  I will also increase  her torsemide from 30 mg daily to 40 mg daily after increased diuresis  Today she denies chest pain, fatigue, palpitations, melena, hematuria, hemoptysis, diaphoresis, weakness, presyncope, syncope, orthopnea, and PND.    Home Medications    Prior to Admission medications   Medication Sig Start Date End Date Taking? Authorizing Provider  acetaminophen (TYLENOL) 500 MG tablet Take 500 mg by mouth every 6 (six) hours as needed for mild pain.    [provider]  acetaminophen (TYLENOL) 500 MG tablet Take 2 tablets (1,000 mg total) by mouth every 8 (eight) hours. 09/01/22 10/01/22  Kommor, Madison, MD  apixaban (ELIQUIS) 5 MG TABS tablet Take 1 tablet (5 mg total) by mouth 2 (two) times daily. 01/02/22   Lyn Records, MD  b complex vitamins capsule Take 1 capsule by mouth daily.    [provider]  BD PEN NEEDLE NANO 2ND GEN 32G X 4 MM MISC  11/26/20   [provider]  cholecalciferol (VITAMIN D) 25 MCG (1000 UNIT) tablet Take 1,000 Units by mouth daily.    [provider]   Continuous Blood Gluc Receiver (FREESTYLE LIBRE READER) DEVI Use as directed 03/14/22     Continuous Blood Gluc Sensor (FREESTYLE LIBRE 3 SENSOR) MISC Place one sensor every 14 days 03/14/22     cyclobenzaprine (FLEXERIL) 5 MG tablet Take 1 tablet (5 mg total) by mouth every evening. 08/28/22     diltiazem (CARDIZEM CD) 120 MG 24 hr capsule Take 1 capsule (120 mg total) by mouth daily. 01/02/22   Lyn Records, MD  escitalopram (LEXAPRO) 20 MG tablet TAKE ONE TABLET BY MOUTH DAILY 06/15/22   Nafziger, Kandee Keen, NP  Evolocumab (REPATHA SURECLICK) 140 MG/ML SOAJ Inject 1 dose into the skin every 14 (fourteen) days. 03/27/22   Lyn Records, MD  insulin NPH Human (HUMULIN N) 100 UNIT/ML injection Inject 20 units under the skin in the morning and 14 units in evening 02/20/22     Insulin NPH, Human,, Isophane, (HUMULIN N KWIKPEN) 100 UNIT/ML Kiwkpen Inject 20 units into the skin every morning and 14 units every evening. 02/20/22     Insulin Pen Needle (UNIFINE PENTIPS) 32G X 4 MM MISC Inject 4 times subcutaneously 06/23/21     insulin regular (HUMULIN R) 100 units/mL injection Inject 10 Units total into the skin 3 (three) times daily before meals. 02/20/22     Insulin Regular Human (NOVOLIN R FLEXPEN RELION) 100 UNIT/ML KwikPen Inject 5 to 10 units under the skin three times daily. 02/20/22     Insulin Syringe-Needle U-100 (ULTICARE INSULIN SYRINGE) 31G X 5/16" 1 ML MISC Inject 4 times daily subcutaneously 01/12/22   Shirline Frees, NP  metFORMIN (GLUCOPHAGE-XR) 500 MG 24 hr tablet Take 1 tablet by mouth 2 times daily 08/29/22     Multiple Vitamin (MULITIVITAMIN WITH MINERALS) TABS Take 1 tablet by mouth daily.    [provider]  nitroGLYCERIN (NITROSTAT) 0.4 MG SL tablet Place 1 tablet (0.4 mg total) under the tongue every 5 (five) minutes as needed for chest pain. 11/14/21   Lyn Records, MD  omeprazole (PRILOSEC) 20 MG capsule Take 1 capsule (20 mg total) by mouth daily. 06/07/22   [name], Willaim Rayas,  MD  oxyCODONE (ROXICODONE) 5 MG immediate release tablet Take 1 tablet (5 mg total) by mouth every 6 (six) hours as needed for breakthrough pain. 09/01/22   Kommor, Madison, MD  polyethylene glycol (MIRALAX / GLYCOLAX) 17 g packet Take 17 g by mouth daily  as needed for mild constipation.    [provider]  potassium chloride SA (KLOR-CON M) 20 MEQ tablet Take 1 tablet (20 mEq total) by mouth twice daily for 3 days only, then decrease to taking 1 tablet (20 mEq total) by mouth daily thereafter.  Take in concurrence with torsemide. 06/26/22   Meriam Sprague, MD  pregabalin (LYRICA) 75 MG capsule Take 1 capsule (75 mg total) by mouth 2 (two) times daily. 07/05/22     primidone (MYSOLINE) 50 MG tablet Take 1 tablet (50 mg total) by mouth every morning. 07/31/22   Dohmeier, Porfirio Mylar, MD  SYNTHROID 137 MCG tablet Take 1 tablet (137 mcg total) by mouth every morning on empty stomach. 08/01/22     torsemide (DEMADEX) 20 MG tablet Take 1.5 tablets (30 mg total) by mouth daily. 08/10/22   Sharlene Dory, PA-C    Family History    Family History  Adopted: Yes  Problem Relation Age of Onset   Healthy Son        x 2   Headache Other        Cluster headaches   Heart failure Mother    Colon cancer Neg Hx    Pancreatic cancer Neg Hx    Rectal cancer Neg Hx    Stomach cancer Neg Hx    is adopted.   Social History    Social History   Socioeconomic History   Marital status: Married    Spouse name: Not on file   Number of children: 2   Years of education: Not on file   Highest education level: Not on file  Occupational History   Occupation: Health and safety inspector of the [employer]  Tobacco Use   Smoking status: Never    Passive exposure: Never   Smokeless tobacco: Never  Vaping Use   Vaping Use: Never used  Substance and Sexual Activity   Alcohol use: No    Alcohol/week: 0.0 standard drinks of alcohol   Drug use: Never   Sexual activity: Not Currently  Other Topics Concern    Not on file  Social History Narrative   Lives with husband in a one story home.     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   Education: college.   Social Determinants of Health   Financial Resource Strain: Low Risk  (08/29/2022)   Overall Financial Resource Strain (CARDIA)    Difficulty of Paying Living Expenses: Not hard at all  Food Insecurity: No Food Insecurity (09/04/2022)   Hunger Vital Sign    Worried About Running Out of Food in the Last Year: Never true    Ran Out of Food in the Last Year: Never true  Transportation Needs: No Transportation Needs (09/04/2022)   PRAPARE - Administrator, Civil Service (Medical): No    Lack of Transportation (Non-Medical): No  Physical Activity: Insufficiently Active (08/29/2022)   Exercise Vital Sign    Days of Exercise per Week: 2 days    Minutes of Exercise per Session: 40 min  Stress: No Stress Concern Present (08/29/2022)   [employer] of Occupational Health - Occupational Stress Questionnaire    Feeling of Stress : Not at all  Social Connections: Socially Integrated (08/29/2022)   Social Connection and Isolation Panel [NHANES]    Frequency of Communication with Friends and Family: More than three times a week    Frequency of Social Gatherings with Friends and  Family: More than three times a week    Attends Religious Services: More than 4 times per year    Active Member of Clubs or Organizations: Yes    Attends Banker Meetings: More than 4 times per year    Marital Status: Married  Catering manager Violence: Not At Risk (08/29/2022)   Humiliation, Afraid, Rape, and Kick questionnaire    Fear of Current or Ex-Partner: No    Emotionally Abused: No    Physically Abused: No    Sexually Abused: No     Review of Systems    General:  No chills, fever, night sweats or weight changes.  Cardiovascular:  No chest pain, dyspnea on exertion, edema,  orthopnea, palpitations, paroxysmal nocturnal dyspnea. Dermatological: No rash, lesions/masses Respiratory: No cough, dyspnea Urologic: No hematuria, dysuria Abdominal:   No nausea, vomiting, diarrhea, bright red blood per rectum, melena, or hematemesis Neurologic:  No visual changes, wkns, changes in mental status. All other systems reviewed and are otherwise negative except as noted above.  Physical Exam    VS:  BP (!) 104/52 (BP Location: Right Arm, Patient Position: Sitting, Cuff Size: Normal)   Pulse 68   Ht 5\' 5"  (1.651 m)   Wt 182 lb (82.6 kg)   BMI 30.29 kg/m  , BMI Body mass index is 30.29 kg/m. GEN: Well nourished, well developed, in no acute distress. HEENT: normal. Neck: Supple, no JVD, carotid bruits, or masses. Cardiac: RRR, no murmurs, rubs, or gallops. No clubbing, cyanosis, bilateral lower extremity 2+ pitting edema.  Radials/DP/PT 2+ and equal bilaterally.  Respiratory:  Respirations regular and unlabored, clear to auscultation bilaterally. GI: Soft, nontender, nondistended, BS + x 4. MS: no deformity or atrophy. Skin: warm and dry, no rash. Neuro:  Strength and sensation are intact. Psych: Normal affect.  Accessory Clinical Findings    Recent Labs: 07/24/2022: Magnesium 1.8 08/31/2022: NT-Pro BNP 511 09/01/2022: ALT 31; Hemoglobin 10.6; Platelets 168 09/18/2022: BUN 26; Creatinine, Ser 0.86; Potassium 4.3; Sodium 136   Recent Lipid Panel    Component Value Date/Time   CHOL 106 09/06/2021 1018   CHOL 156 11/21/2016 1113   TRIG 101 09/06/2021 1018   HDL 50 09/06/2021 1018   HDL 44 11/21/2016 1113   CHOLHDL 2.1 09/06/2021 1018   VLDL 20 09/06/2021 1018   LDLCALC 36 09/06/2021 1018   LDLCALC 91 11/21/2016 1113   LDLDIRECT 98.0 05/24/2014 1343         ECG personally reviewed by me today-none today.  EKG 09/13/22 normal sinus rhythm possible left atrial enlargement left axis deviation left bundle branch block 73 bpm- No acute changes   Echocardiogram  07/17/2022  IMPRESSIONS     1. Left ventricular ejection fraction, by estimation, is 60 to 65%. The  left ventricle has normal function. The left ventricle has no regional  wall motion abnormalities. Diastolic function is indeterminant due to  severe MAC.   2. Right ventricular systolic function was not well visualized. The right  ventricular size is not well visualized.   3. Left atrial size was moderately dilated.   4. The mitral valve is degenerative. Trivial mitral valve regurgitation.  Mild calcific mitral stenosis with mean gradient of at HR 68bpm.  Severe mitral annular calcification.   5. The aortic valve has been repaired/replaced. Aortic valve  regurgitation is not visualized. There is a 23 mm Sapien prosthetic (TAVR)  valve present in the aortic position. Procedure Date: 07/08/18. Echo  findings are consistent with normal  structure and   function of the aortic valve prosthesis. Aortic valve mean gradient  measures 16.3 mmHg. Aortic valve Vmax measures 2.64 m/s.   6. The inferior vena cava is normal in size with greater than 50%  respiratory variability, suggesting right atrial pressure of 3 mmHg.   Comparison(s): No significant change from prior study.    2D Doppler echocardiogram 2022: IMPRESSIONS   1. Left ventricular ejection fraction, by estimation, is 60 to 65%. The  left ventricle has normal function. The left ventricle has no regional  wall motion abnormalities. There is mild left ventricular hypertrophy.  Left ventricular diastolic parameters  are consistent with Grade I diastolic dysfunction (impaired relaxation).  The average left ventricular global longitudinal strain is -19.9 %. The  global longitudinal strain is normal.   2. Right ventricular systolic function is normal. The right ventricular  size is normal.   3. Left atrial size was moderately dilated.   4. The mitral valve is normal in structure. No evidence of mitral valve  regurgitation. Mild  mitral stenosis. The mean mitral valve gradient is 4.5  mmHg. Severe mitral annular calcification.   5. The aortic valve has been repaired/replaced. Aortic valve  regurgitation is not visualized. No aortic stenosis is present. There is a  23 mm Edwards Sapien prosthetic (TAVR) valve present in the aortic  position. Echo findings are consistent with normal   structure and function of the aortic valve prosthesis. Aortic valve area,  by VTI measures 1.99 cm. Aortic valve mean gradient measures 15.0 mmHg.  Aortic valve Vmax measures 2.43 m/s.   6. The inferior vena cava is normal in size with greater than 50%  respiratory variability, suggesting right atrial pressure of 3 mmHg.   Comparison(s): No significant change from prior study. Prior images  reviewed side by side.   Assessment & Plan   1.  Acute on chronic diastolic CHF -weight today 182 lbs.  2+ bilateral lower extremity pitting edema left greater than right. Increase torsemide to 60 mg x 3 days and then return to 40 mg dosing Increase potassium to 40 meq x 3 days then return to 20 meq daily dosing Order Zaroxolyn 2.5 mg to be taken Thursday and Saturday 30 minutes prior to torsemide Fluid restriction-48 ounces or less daily Daily weights-contact office with a weight increase of 2 to 3 pounds overnight or 5 pounds in 1 week Elevate lower extremities when not active Lower extremity support stockings Order BMP in 1 week  Paroxysmal atrial fibrillation-heart rate today 68.  Continues to be compliant with apixaban.  No bleeding issues currently. Continue apixaban, diltiazem Avoid triggers caffeine, chocolate, EtOH, dehydration etc.  Aortic stenosis-status post TAVR.  Well-functioning on 07/17/2022 echocardiogram. Plan for repeat echocardiogram 3/25  Coronary artery disease-no chest pain today.  Underwent cardiac catheterization with PCI and DES to mid RCA 12/19. Continue current medical therapy Low-sodium diet  Disposition:  Follow-up with Dr. Shari Prows or APP in 2 weeks    Thomasene Ripple. Apryl Brymer NP-C     09/19/2022, 2:47 PM [facility] [address] Office [phone] Fax [phone]    I spent 14 minutes examining this patient, reviewing medications, and using patient centered shared decision making involving her cardiac care.  Prior to her visit I spent greater than 20 minutes reviewing her past medical history,  medications, and prior cardiac tests.

## 2022-09-19 ENCOUNTER — Ambulatory Visit: Payer: Medicare Other | Attending: General Practice | Admitting: General Practice

## 2022-09-19 ENCOUNTER — Other Ambulatory Visit (HOSPITAL_COMMUNITY): Payer: Self-pay

## 2022-09-19 ENCOUNTER — Other Ambulatory Visit (HOSPITAL_BASED_OUTPATIENT_CLINIC_OR_DEPARTMENT_OTHER): Payer: Self-pay

## 2022-09-19 ENCOUNTER — Encounter: Payer: Self-pay | Admitting: General Practice

## 2022-09-19 VITALS — BP 104/52 | HR 68 | Ht 65.0 in | Wt 182.0 lb

## 2022-09-19 DIAGNOSIS — I2510 Atherosclerotic heart disease of native coronary artery without angina pectoris: Secondary | ICD-10-CM | POA: Diagnosis not present

## 2022-09-19 DIAGNOSIS — Z952 Presence of prosthetic heart valve: Secondary | ICD-10-CM

## 2022-09-19 DIAGNOSIS — Z79899 Other long term (current) drug therapy: Secondary | ICD-10-CM

## 2022-09-19 DIAGNOSIS — I5033 Acute on chronic diastolic (congestive) heart failure: Secondary | ICD-10-CM | POA: Diagnosis not present

## 2022-09-19 DIAGNOSIS — I480 Paroxysmal atrial fibrillation: Secondary | ICD-10-CM | POA: Diagnosis not present

## 2022-09-19 LAB — BASIC METABOLIC PANEL
BUN/Creatinine Ratio: 30 — ABNORMAL HIGH (ref 12–28)
BUN: 26 mg/dL (ref 8–27)
CO2: 21 mmol/L (ref 20–29)
Calcium: 10.9 mg/dL — ABNORMAL HIGH (ref 8.7–10.3)
Chloride: 98 mmol/L (ref 96–106)
Creatinine, Ser: 0.86 mg/dL (ref 0.57–1.00)
Glucose: 189 mg/dL — ABNORMAL HIGH (ref 70–99)
Potassium: 4.3 mmol/L (ref 3.5–5.2)
Sodium: 136 mmol/L (ref 134–144)
eGFR: 69 mL/min/{1.73_m2} (ref >59)

## 2022-09-19 MED ORDER — METOLAZONE 2.5 MG PO TABS
ORAL_TABLET | ORAL | 0 refills | Status: DC
  Filled 2022-09-19: qty 2, 1d supply, fill #0

## 2022-09-19 NOTE — Patient Instructions (Signed)
Medication Instructions:  INCREASE Torsemide to 60 mg daily for 3 days then on day 4 going forward take 40 mg daily INCREASE Potasium to 40 meq daily for 3 days then on day 4 going forward take 20 meq daily  On Thursday and Saturday 30 minutes before taking Torsemide take 2.5 mg of Metolazone   *If you need a refill on your cardiac medications before your next appointment, please call your pharmacy*  Lab Work: Your physician recommends that you return for lab work in 1 week :  BMP If you have labs (blood work) drawn today and your tests are completely normal, you will receive your results only by: MyChart Message (if you have MyChart) OR A paper copy in the mail If you have any lab test that is abnormal or we need to change your treatment, we will call you to review the results.  Testing/Procedures: NONE ordered at this time of appointment   Follow-Up: At [facility], you and your health needs are our priority.  As part of our continuing mission to provide you with exceptional heart care, we have created designated Provider Care Teams.  These Care Teams include your primary Cardiologist (physician) and Advanced Practice Providers (APPs -  Physician Assistants and Nurse Practitioners) who all work together to provide you with the care you need, when you need it.    Your next appointment:   2 week(s)  Provider:   Edd Fabian, FNP        Other Instructions Per recommendations of Edd Fabian, NP you will need to drink no more than 48 fluid ounces in a day This includes ice chips, coffee and/or tea, soup and/or broth, jello  Please review the information given in office for the [employer] for compression stockings

## 2022-09-20 ENCOUNTER — Other Ambulatory Visit (HOSPITAL_BASED_OUTPATIENT_CLINIC_OR_DEPARTMENT_OTHER): Payer: Self-pay

## 2022-09-21 DIAGNOSIS — M25561 Pain in right knee: Secondary | ICD-10-CM | POA: Diagnosis not present

## 2022-09-21 DIAGNOSIS — M25512 Pain in left shoulder: Secondary | ICD-10-CM | POA: Diagnosis not present

## 2022-09-22 ENCOUNTER — Other Ambulatory Visit (HOSPITAL_BASED_OUTPATIENT_CLINIC_OR_DEPARTMENT_OTHER): Payer: Self-pay

## 2022-09-25 ENCOUNTER — Telehealth: Payer: Self-pay

## 2022-09-25 DIAGNOSIS — K7460 Unspecified cirrhosis of liver: Secondary | ICD-10-CM

## 2022-09-25 NOTE — Telephone Encounter (Signed)
Order placed for RUQ U/S and AFP. MyChart message to patient to expect a call from scheduling.

## 2022-09-25 NOTE — Telephone Encounter (Signed)
-----   Message from Cooper Render, CMA sent at 04/11/2022 11:21 AM EST ----- Regarding: RUQ U/S and AFP RUQ U/S and AFP due in June

## 2022-09-27 ENCOUNTER — Encounter: Payer: Self-pay | Admitting: Hematology

## 2022-09-27 ENCOUNTER — Encounter: Payer: Self-pay | Admitting: Neurology

## 2022-09-27 ENCOUNTER — Other Ambulatory Visit (HOSPITAL_BASED_OUTPATIENT_CLINIC_OR_DEPARTMENT_OTHER): Payer: Self-pay

## 2022-09-27 ENCOUNTER — Other Ambulatory Visit: Payer: Self-pay

## 2022-09-27 DIAGNOSIS — M25561 Pain in right knee: Secondary | ICD-10-CM | POA: Diagnosis not present

## 2022-09-27 DIAGNOSIS — S42032A Displaced fracture of lateral end of left clavicle, initial encounter for closed fracture: Secondary | ICD-10-CM | POA: Diagnosis not present

## 2022-09-27 MED ORDER — METHOCARBAMOL 500 MG PO TABS
500.0000 mg | ORAL_TABLET | Freq: Four times a day (QID) | ORAL | 0 refills | Status: DC
  Filled 2022-09-27: qty 60, 15d supply, fill #0

## 2022-09-27 MED ORDER — PRIMIDONE 50 MG PO TABS
50.0000 mg | ORAL_TABLET | Freq: Every morning | ORAL | 3 refills | Status: DC
  Filled 2022-09-27: qty 30, 30d supply, fill #0
  Filled 2022-10-29: qty 30, 30d supply, fill #1

## 2022-09-28 ENCOUNTER — Other Ambulatory Visit: Payer: Self-pay

## 2022-09-28 ENCOUNTER — Other Ambulatory Visit (HOSPITAL_BASED_OUTPATIENT_CLINIC_OR_DEPARTMENT_OTHER): Payer: Self-pay

## 2022-10-02 NOTE — Progress Notes (Unsigned)
Cardiology Clinic Note   Patient Name: Breanna Webster Date of Encounter: 10/04/2022  Primary Care Provider:  Shirline Frees, NP Primary Cardiologist:  Meriam Sprague, MD  Patient Profile    Breanna Webster 77 year old female presents to the clinic today for evaluation of her intermittent shortness of breath and lower extremity swelling.  Past Medical History    Past Medical History:  Diagnosis Date   Anxiety    Arthritis    "back" (04/22/2018)   Back pain    Blood transfusion without reported diagnosis    CAD (coronary artery disease)    a. 03/2018 s/p PCI/DES to the RCA (3.0x15 Onyx DES).   Carotid artery stenosis    Mild   Chest pain    CHF (congestive heart failure) (HCC)    Chronic lower back pain    Cirrhosis (HCC)    Colon polyps    Diverticulitis    Diverticulosis    Esophageal thickening    seen on pre TAVR CT scan, also questionable cirrhosis. MRI recommended. Will refer to GI   GERD (gastroesophageal reflux disease)    Grave's disease    Heart murmur    History of colonic polyps 05/22/2017   History of hiatal hernia    Hyperlipidemia    Hypertension    Hypothyroidism    IBS (irritable bowel syndrome)    Osteopenia    Parkinson's syndrome 04/23/2020   Pulmonary nodules    seen on pre TAVR CT. likley benign. no follow up recommended if pt low risk.   S/P TAVR (transcatheter aortic valve replacement)    Shortness of breath on exertion    Stroke (HCC) 2021   x2   Thalassemia minor    Thyroid disease    Type II diabetes mellitus (HCC)    Past Surgical History:  Procedure Laterality Date   30 HOUR PH STUDY N/A 03/03/2018   Procedure: 24 HOUR PH STUDY;  Surgeon: Napoleon Form, MD;  Location: [facility];  Service: Endoscopy;  Laterality: N/A;   COLONOSCOPY  2023   COLONOSCOPY W/ BIOPSIES AND POLYPECTOMY     CORONARY ANGIOGRAPHY Right 04/21/2018   Procedure: CORONARY ANGIOGRAPHY (CATH LAB);  Surgeon: Lyn Records, MD;  Location: [facility]  INVASIVE CV LAB;  Service: Cardiovascular;  Laterality: Right;   CORONARY STENT INTERVENTION N/A 04/22/2018   Procedure: CORONARY STENT INTERVENTION;  Surgeon: Lyn Records, MD;  Location: [facility] INVASIVE CV LAB;  Service: Cardiovascular;  Laterality: N/A;   DILATION AND CURETTAGE OF UTERUS     ESOPHAGEAL MANOMETRY N/A 03/03/2018   Procedure: ESOPHAGEAL MANOMETRY (EM);  Surgeon: Napoleon Form, MD;  Location: [facility];  Service: Endoscopy;  Laterality: N/A;   HYSTEROSCOPY     fibroids   LAPAROSCOPIC CHOLECYSTECTOMY  1985   LAPAROSCOPY     fibroids   NISSEN FUNDOPLICATION  1990s   POLYPECTOMY     REVERSE SHOULDER ARTHROPLASTY Left 06/01/2022   Procedure: REVERSE SHOULDER ARTHROPLASTY;  Surgeon: Beverely Low, MD;  Location: [facility] ORS;  Service: Orthopedics;  Laterality: Left;  choice with interscalene block   RIGHT/LEFT HEART CATH AND CORONARY ANGIOGRAPHY N/A 02/20/2018   Procedure: RIGHT/LEFT HEART CATH AND CORONARY ANGIOGRAPHY;  Surgeon: Lyn Records, MD;  Location: [facility] INVASIVE CV LAB;  Service: Cardiovascular;  Laterality: N/A;   TEE WITHOUT CARDIOVERSION N/A 07/08/2018   Procedure: TRANSESOPHAGEAL ECHOCARDIOGRAM (TEE);  Surgeon: Kathleene Hazel, MD;  Location: [facility] OR;  Service: Open Heart Surgery;  Laterality: N/A;   TEE  WITHOUT CARDIOVERSION  10/07/2018   TEE WITHOUT CARDIOVERSION N/A 10/07/2018   Procedure: TRANSESOPHAGEAL ECHOCARDIOGRAM (TEE);  Surgeon: Jake Bathe, MD;  Location: [facility] ENDOSCOPY;  Service: Cardiovascular;  Laterality: N/A;   TONSILLECTOMY     age 74   TRANSCATHETER AORTIC VALVE REPLACEMENT, TRANSFEMORAL N/A 07/08/2018   Procedure: TRANSCATHETER AORTIC VALVE REPLACEMENT, TRANSFEMORAL;  Surgeon: Kathleene Hazel, MD;  Location: [facility] OR;  Service: Open Heart Surgery;  Laterality: N/A;    Allergies  No Known Allergies  History of Present Illness    Breanna Webster has a PMH of CHF, hypertension, hyperlipidemia, Parkinson's, history of CVA,  Graves' disease, type 2 diabetes, coronary artery disease status post PCI with DES x 1 to mid RCA 12/19, mitral valve endocarditis 6/20, paroxysmal atrial fibrillation on apixaban, 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  She was seen in follow-up by Jari Favre, PA-C in March for dizziness.  It was felt that her symptoms were related to decreased blood pressure.  She was checking her blood pressure with a wrist cuff.  She was encouraged to obtain a new blood pressure cuff.  She was noted to have some lower extremity edema with left greater than right which is chronic.  Prior lower extremity ultrasounds negative for DVT.  Lower extremity compression stockings were recommended however, they were not previously successful.  She was seen again in follow-up by Jari Favre, PA-C on 08/10/2022.  She reported that she was frustrated with using the bathroom 8-9 times a day with spironolactone.  Her legs were showing less edema.  She reported that she was drinking several cups of water daily.  She was following a low-no salt diet.  She requested discontinuation of spironolactone.  She was also concerned about her lower blood pressure.  She denied shortness of breath and dyspnea on exertion.  She denied chest pain, orthopnea, PND, and palpitations.  Patient contacted nurse triage line 09/12/2022 and reported intermittent periods of shortness of breath and increased lower extremity swelling with left leg greater than right.  She presented to the clinic 09/13/2022 for follow-up evaluation and stated she continues to notice lower extremity swelling.  We reviewed the importance of fluid restriction, lower extremity support stockings, and elevating lower extremities to chest height.  She and her husband expressed understanding.  She reported that she had not been consuming extra fluid.  Her husband reported  that she drank several glasses of ice water daily.  Her  blood pressure was 104/60.  Her EKG showed normal sinus rhythm with possible left atrial enlargement 73 bpm.  I  increased her torsemide to 60 mg for 3 days and then planned to resume normal dosing.  I increased her potassium to 20 mill equivalents for 3 days.  I planned BMP in 1 week, gave 48 ounce fluid restriction and planned follow-up in 1 to 2 weeks.  She presented to the clinic 09/19/22 for follow-up evaluation and stated she tolerated the medication well.  Her weight was unchanged.  She reported that she did not weigh by scale in the clinic last week.  She reported not measuring fluid intake.  We reviewed the importance of fluid restriction and lower extremity support stockings.  I repeated increased diuresis and increase potassium.  I added Zaroxolyn 2.5 mg for 2 days and planned repeat BMP in 1 week.  I planned follow-up in 2 weeks.  She and her husband expressed understanding.  I  also increased her  torsemide from 30 mg daily to 40 mg daily after increased diuresis.   She presents to the clinic today for follow-up evaluation and states she feels she still has some swelling in her feet.  Her weight today is much better at 176.6 pounds.  Her previous weight was 182 pounds.  She had increased urination with increased diuresis.  She has been wearing lower extremity support stockings.  I helped put them on today.  She has some left upper extremity swelling postoperatively.  I encouraged them to use elevation and an upper extremity compression sleeve.  I have also recommended that they reach out to orthopedics for physical therapy recommendations.  I will order a BMP today and plan follow-up in 4 months.  Today she denies chest pain, fatigue, palpitations, melena, hematuria, hemoptysis, diaphoresis, weakness, presyncope, syncope, orthopnea, and PND.    Home Medications    Prior to Admission medications   Medication Sig Start Date End Date Taking? Authorizing Provider  acetaminophen (TYLENOL) 500 MG  tablet Take 500 mg by mouth every 6 (six) hours as needed for mild pain.    [provider]  acetaminophen (TYLENOL) 500 MG tablet Take 2 tablets (1,000 mg total) by mouth every 8 (eight) hours. 09/01/22 10/01/22  Kommor, Madison, MD  apixaban (ELIQUIS) 5 MG TABS tablet Take 1 tablet (5 mg total) by mouth 2 (two) times daily. 01/02/22   Lyn Records, MD  b complex vitamins capsule Take 1 capsule by mouth daily.    [provider]  BD PEN NEEDLE NANO 2ND GEN 32G X 4 MM MISC  11/26/20   [provider]  cholecalciferol (VITAMIN D) 25 MCG (1000 UNIT) tablet Take 1,000 Units by mouth daily.    [provider]  Continuous Blood Gluc Receiver (FREESTYLE LIBRE READER) DEVI Use as directed 03/14/22     Continuous Blood Gluc Sensor (FREESTYLE LIBRE 3 SENSOR) MISC Place one sensor every 14 days 03/14/22     cyclobenzaprine (FLEXERIL) 5 MG tablet Take 1 tablet (5 mg total) by mouth every evening. 08/28/22     diltiazem (CARDIZEM CD) 120 MG 24 hr capsule Take 1 capsule (120 mg total) by mouth daily. 01/02/22   Lyn Records, MD  escitalopram (LEXAPRO) 20 MG tablet TAKE ONE TABLET BY MOUTH DAILY 06/15/22   Nafziger, Kandee Keen, NP  Evolocumab (REPATHA SURECLICK) 140 MG/ML SOAJ Inject 1 dose into the skin every 14 (fourteen) days. 03/27/22   Lyn Records, MD  insulin NPH Human (HUMULIN N) 100 UNIT/ML injection Inject 20 units under the skin in the morning and 14 units in evening 02/20/22     Insulin NPH, Human,, Isophane, (HUMULIN N KWIKPEN) 100 UNIT/ML Kiwkpen Inject 20 units into the skin every morning and 14 units every evening. 02/20/22     Insulin Pen Needle (UNIFINE PENTIPS) 32G X 4 MM MISC Inject 4 times subcutaneously 06/23/21     insulin regular (HUMULIN R) 100 units/mL injection Inject 10 Units total into the skin 3 (three) times daily before meals. 02/20/22     Insulin Regular Human (NOVOLIN R FLEXPEN RELION) 100 UNIT/ML KwikPen Inject 5 to 10 units under the skin three times  daily. 02/20/22     Insulin Syringe-Needle U-100 (ULTICARE INSULIN SYRINGE) 31G X 5/16" 1 ML MISC Inject 4 times daily subcutaneously 01/12/22   Shirline Frees, NP  metFORMIN (GLUCOPHAGE-XR) 500 MG 24 hr tablet Take 1 tablet by mouth 2 times daily 08/29/22     Multiple Vitamin (MULITIVITAMIN WITH MINERALS)  TABS Take 1 tablet by mouth daily.    [provider]  nitroGLYCERIN (NITROSTAT) 0.4 MG SL tablet Place 1 tablet (0.4 mg total) under the tongue every 5 (five) minutes as needed for chest pain. 11/14/21   Lyn Records, MD  omeprazole (PRILOSEC) 20 MG capsule Take 1 capsule (20 mg total) by mouth daily. 06/07/22   [name], Willaim Rayas, MD  oxyCODONE (ROXICODONE) 5 MG immediate release tablet Take 1 tablet (5 mg total) by mouth every 6 (six) hours as needed for breakthrough pain. 09/01/22   Kommor, Madison, MD  polyethylene glycol (MIRALAX / GLYCOLAX) 17 g packet Take 17 g by mouth daily as needed for mild constipation.    [provider]  potassium chloride SA (KLOR-CON M) 20 MEQ tablet Take 1 tablet (20 mEq total) by mouth twice daily for 3 days only, then decrease to taking 1 tablet (20 mEq total) by mouth daily thereafter.  Take in concurrence with torsemide. 06/26/22   Meriam Sprague, MD  pregabalin (LYRICA) 75 MG capsule Take 1 capsule (75 mg total) by mouth 2 (two) times daily. 07/05/22     primidone (MYSOLINE) 50 MG tablet Take 1 tablet (50 mg total) by mouth every morning. 07/31/22   Dohmeier, Porfirio Mylar, MD  SYNTHROID 137 MCG tablet Take 1 tablet (137 mcg total) by mouth every morning on empty stomach. 08/01/22     torsemide (DEMADEX) 20 MG tablet Take 1.5 tablets (30 mg total) by mouth daily. 08/10/22   Sharlene Dory, PA-C    Family History    Family History  Adopted: Yes  Problem Relation Age of Onset   Healthy Son        x 2   Headache Other        Cluster headaches   Heart failure Mother    Colon cancer Neg Hx    Pancreatic cancer Neg Hx    Rectal cancer Neg Hx     Stomach cancer Neg Hx    is adopted.   Social History    Social History   Socioeconomic History   Marital status: Married    Spouse name: Not on file   Number of children: 2   Years of education: Not on file   Highest education level: Not on file  Occupational History   Occupation: Health and safety inspector of the [employer]  Tobacco Use   Smoking status: Never    Passive exposure: Never   Smokeless tobacco: Never  Vaping Use   Vaping Use: Never used  Substance and Sexual Activity   Alcohol use: No    Alcohol/week: 0.0 standard drinks of alcohol   Drug use: Never   Sexual activity: Not Currently  Other Topics Concern   Not on file  Social History Narrative   Lives with husband in a one story home.     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   Education: college.   Social Determinants of Health   Financial Resource Strain: Low Risk  (08/29/2022)   Overall Financial Resource Strain (CARDIA)    Difficulty of Paying Living Expenses: Not hard at all  Food Insecurity: No Food Insecurity (09/04/2022)   Hunger Vital Sign    Worried About Running Out of Food in the Last Year: Never true    Ran Out of Food in the Last Year: Never true  Transportation Needs: No Transportation Needs (09/04/2022)   PRAPARE - Transportation  Lack of Transportation (Medical): No    Lack of Transportation (Non-Medical): No  Physical Activity: Insufficiently Active (08/29/2022)   Exercise Vital Sign    Days of Exercise per Week: 2 days    Minutes of Exercise per Session: 40 min  Stress: No Stress Concern Present (08/29/2022)   [employer] of Occupational Health - Occupational Stress Questionnaire    Feeling of Stress : Not at all  Social Connections: Socially Integrated (08/29/2022)   Social Connection and Isolation Panel [NHANES]    Frequency of Communication with Friends and Family: More than three times a week    Frequency  of Social Gatherings with Friends and Family: More than three times a week    Attends Religious Services: More than 4 times per year    Active Member of [employer] or Organizations: Yes    Attends Engineer, structural: More than 4 times per year    Marital Status: Married  Catering manager Violence: Not At Risk (08/29/2022)   Humiliation, Afraid, Rape, and Kick questionnaire    Fear of Current or Ex-Partner: No    Emotionally Abused: No    Physically Abused: No    Sexually Abused: No     Review of Systems    General:  No chills, fever, night sweats or weight changes.  Cardiovascular:  No chest pain, dyspnea on exertion, edema, orthopnea, palpitations, paroxysmal nocturnal dyspnea. Dermatological: No rash, lesions/masses Respiratory: No cough, dyspnea Urologic: No hematuria, dysuria Abdominal:   No nausea, vomiting, diarrhea, bright red blood per rectum, melena, or hematemesis Neurologic:  No visual changes, wkns, changes in mental status. All other systems reviewed and are otherwise negative except as noted above.  Physical Exam    VS:  BP (!) 118/58 (BP Location: Left Arm, Patient Position: Sitting)   Pulse 70   Ht 5\' 5"  (1.651 m)   Wt 176 lb 9.6 oz (80.1 kg)   SpO2 98%   BMI 29.39 kg/m  , BMI Body mass index is 29.39 kg/m. GEN: Well nourished, well developed, in no acute distress. HEENT: normal. Neck: Supple, no JVD, carotid bruits, or masses. Cardiac: RRR, no murmurs, rubs, or gallops. No clubbing, cyanosis, generalized bilateral lower extremity nonpitting edema.  Radials/DP/PT 2+ and equal bilaterally.  Respiratory:  Respirations regular and unlabored, clear to auscultation bilaterally. GI: Soft, nontender, nondistended, BS + x 4. MS: no deformity or atrophy. Skin: warm and dry, no rash. Neuro:  Strength and sensation are intact. Psych: Normal affect.  Accessory Clinical Findings    Recent Labs: 07/24/2022: Magnesium 1.8 08/31/2022: NT-Pro BNP 511 09/01/2022: ALT  31; Hemoglobin 10.6; Platelets 168 09/18/2022: BUN 26; Creatinine, Ser 0.86; Potassium 4.3; Sodium 136   Recent Lipid Panel    Component Value Date/Time   CHOL 106 09/06/2021 1018   CHOL 156 11/21/2016 1113   TRIG 101 09/06/2021 1018   HDL 50 09/06/2021 1018   HDL 44 11/21/2016 1113   CHOLHDL 2.1 09/06/2021 1018   VLDL 20 09/06/2021 1018   LDLCALC 36 09/06/2021 1018   LDLCALC 91 11/21/2016 1113   LDLDIRECT 98.0 05/24/2014 1343         ECG personally reviewed by me today-none today.  EKG 09/13/22 normal sinus rhythm possible left atrial enlargement left axis deviation left bundle branch block 73 bpm- No acute changes   Echocardiogram 07/17/2022  IMPRESSIONS     1. Left ventricular ejection fraction, by estimation, is 60 to 65%. The  left ventricle has normal function. The left ventricle has  no regional  wall motion abnormalities. Diastolic function is indeterminant due to  severe MAC.   2. Right ventricular systolic function was not well visualized. The right  ventricular size is not well visualized.   3. Left atrial size was moderately dilated.   4. The mitral valve is degenerative. Trivial mitral valve regurgitation.  Mild calcific mitral stenosis with mean gradient of at HR 68bpm.  Severe mitral annular calcification.   5. The aortic valve has been repaired/replaced. Aortic valve  regurgitation is not visualized. There is a 23 mm Sapien prosthetic (TAVR)  valve present in the aortic position. Procedure Date: 07/08/18. Echo  findings are consistent with normal structure and   function of the aortic valve prosthesis. Aortic valve mean gradient  measures 16.3 mmHg. Aortic valve Vmax measures 2.64 m/s.   6. The inferior vena cava is normal in size with greater than 50%  respiratory variability, suggesting right atrial pressure of 3 mmHg.   Comparison(s): No significant change from prior study.    2D Doppler echocardiogram 2022: IMPRESSIONS   1. Left ventricular  ejection fraction, by estimation, is 60 to 65%. The  left ventricle has normal function. The left ventricle has no regional  wall motion abnormalities. There is mild left ventricular hypertrophy.  Left ventricular diastolic parameters  are consistent with Grade I diastolic dysfunction (impaired relaxation).  The average left ventricular global longitudinal strain is -19.9 %. The  global longitudinal strain is normal.   2. Right ventricular systolic function is normal. The right ventricular  size is normal.   3. Left atrial size was moderately dilated.   4. The mitral valve is normal in structure. No evidence of mitral valve  regurgitation. Mild mitral stenosis. The mean mitral valve gradient is 4.5  mmHg. Severe mitral annular calcification.   5. The aortic valve has been repaired/replaced. Aortic valve  regurgitation is not visualized. No aortic stenosis is present. There is a  23 mm Edwards Sapien prosthetic (TAVR) valve present in the aortic  position. Echo findings are consistent with normal   structure and function of the aortic valve prosthesis. Aortic valve area,  by VTI measures 1.99 cm. Aortic valve mean gradient measures 15.0 mmHg.  Aortic valve Vmax measures 2.43 m/s.   6. The inferior vena cava is normal in size with greater than 50%  respiratory variability, suggesting right atrial pressure of 3 mmHg.   Comparison(s): No significant change from prior study. Prior images  reviewed side by side.   Assessment & Plan   1.  Acute on chronic diastolic CHF -weight today 176.6.  generalized bilateral lower extremity nonpitting edema Continue torsemide to 40 mg daily, potassium Fluid restriction-48 ounces or less daily-reviewed Daily weights-contact office with a weight increase of 2 to 3 pounds overnight or 5 pounds in 1 week Elevate lower extremities when not active-reviewed Lower extremity support stockings-tolerating well Order BMP   Paroxysmal atrial fibrillation-heart  rate today 70.  Continues to be compliant with apixaban.  No bleeding issues reported. Continue apixaban, diltiazem Avoid triggers caffeine, chocolate, EtOH, dehydration etc.-reviewed  Coronary artery disease-denies chest pain.  With cardiac catheterization with PCI and DES to mid RCA 12/19. Continue current medical therapy Low-sodium diet Increase physical activity as tolerated  Aortic stenosis-status post TAVR.  Well-functioning on 07/17/2022 echocardiogram. Plan for repeat echocardiogram 3/25  Disposition: Follow-up with Dr. Shari Prows or APP in 4 months   Thomasene Ripple. Amariya Liskey NP-C     10/04/2022, 2:20 PM [facility]  [address] Office [phone] Fax [phone]    I spent 13 minutes examining this patient, reviewing medications, and using patient centered shared decision making involving her cardiac care.  Prior to her visit I spent greater than 20 minutes reviewing her past medical history,  medications, and prior cardiac tests.

## 2022-10-04 ENCOUNTER — Encounter: Payer: Self-pay | Admitting: General Practice

## 2022-10-04 ENCOUNTER — Ambulatory Visit: Payer: Medicare Other | Attending: General Practice | Admitting: General Practice

## 2022-10-04 VITALS — BP 118/58 | HR 70 | Ht 65.0 in | Wt 176.6 lb

## 2022-10-04 DIAGNOSIS — I5033 Acute on chronic diastolic (congestive) heart failure: Secondary | ICD-10-CM | POA: Diagnosis not present

## 2022-10-04 DIAGNOSIS — I2510 Atherosclerotic heart disease of native coronary artery without angina pectoris: Secondary | ICD-10-CM | POA: Diagnosis not present

## 2022-10-04 DIAGNOSIS — R350 Frequency of micturition: Secondary | ICD-10-CM | POA: Diagnosis not present

## 2022-10-04 DIAGNOSIS — R3915 Urgency of urination: Secondary | ICD-10-CM | POA: Diagnosis not present

## 2022-10-04 DIAGNOSIS — I480 Paroxysmal atrial fibrillation: Secondary | ICD-10-CM

## 2022-10-04 DIAGNOSIS — Z952 Presence of prosthetic heart valve: Secondary | ICD-10-CM | POA: Diagnosis not present

## 2022-10-04 NOTE — Patient Instructions (Signed)
Medication Instructions:  The current medical regimen is effective;  continue present plan and medications as directed. Please refer to the Current Medication list given to you today.  *If you need a refill on your cardiac medications before your next appointment, please call your pharmacy*   Lab Work: BMET If you have labs (blood work) drawn today and your tests are completely normal, you will receive your results only by: MyChart Message (if you have MyChart) OR  A paper copy in the mail If you have any lab test that is abnormal or we need to change your treatment, we will call you to review the results.  Other Instructions PURCHASE AND WEAR LEFT UPPER-EXTREMITY COMPRESSION SLEEVE  CONTINUE FLUID RESTRICTION PLEASE READ AND FOLLOW ATTACHED  SALTY 6 ATTACHED  Follow-Up: At [facility], you and your health needs are our priority.  As part of our continuing mission to provide you with exceptional heart care, we have created designated Provider Care Teams.  These Care Teams include your primary Cardiologist (physician) and Advanced Practice Providers (APPs -  Physician Assistants and Nurse Practitioners) who all work together to provide you with the care you need, when you need it.  Your next appointment:   4 month(s)  Provider:   Meriam Sprague, MD  or Jari Favre, PA-C or Edd Fabian, FNP-C

## 2022-10-05 LAB — BASIC METABOLIC PANEL
BUN/Creatinine Ratio: 29 — ABNORMAL HIGH (ref 12–28)
BUN: 24 mg/dL (ref 8–27)
CO2: 20 mmol/L (ref 20–29)
Calcium: 10.3 mg/dL (ref 8.7–10.3)
Chloride: 104 mmol/L (ref 96–106)
Creatinine, Ser: 0.83 mg/dL (ref 0.57–1.00)
Glucose: 76 mg/dL (ref 70–99)
Potassium: 4.1 mmol/L (ref 3.5–5.2)
Sodium: 139 mmol/L (ref 134–144)
eGFR: 72 mL/min/{1.73_m2} (ref >59)

## 2022-10-08 ENCOUNTER — Other Ambulatory Visit: Payer: Self-pay

## 2022-10-08 ENCOUNTER — Other Ambulatory Visit (HOSPITAL_BASED_OUTPATIENT_CLINIC_OR_DEPARTMENT_OTHER): Payer: Self-pay

## 2022-10-08 DIAGNOSIS — M7989 Other specified soft tissue disorders: Secondary | ICD-10-CM

## 2022-10-08 DIAGNOSIS — Z952 Presence of prosthetic heart valve: Secondary | ICD-10-CM

## 2022-10-08 DIAGNOSIS — I5033 Acute on chronic diastolic (congestive) heart failure: Secondary | ICD-10-CM

## 2022-10-08 DIAGNOSIS — Z794 Long term (current) use of insulin: Secondary | ICD-10-CM

## 2022-10-08 DIAGNOSIS — I5032 Chronic diastolic (congestive) heart failure: Secondary | ICD-10-CM

## 2022-10-08 DIAGNOSIS — Z79899 Other long term (current) drug therapy: Secondary | ICD-10-CM

## 2022-10-08 MED ORDER — TORSEMIDE 20 MG PO TABS
40.0000 mg | ORAL_TABLET | Freq: Every day | ORAL | 6 refills | Status: DC
Start: 2022-10-08 — End: 2023-04-09
  Filled 2022-10-08 – 2022-11-09 (×2): qty 60, 30d supply, fill #0
  Filled 2023-01-31: qty 60, 30d supply, fill #1

## 2022-10-09 ENCOUNTER — Other Ambulatory Visit (HOSPITAL_BASED_OUTPATIENT_CLINIC_OR_DEPARTMENT_OTHER): Payer: Self-pay

## 2022-10-10 ENCOUNTER — Other Ambulatory Visit (HOSPITAL_BASED_OUTPATIENT_CLINIC_OR_DEPARTMENT_OTHER): Payer: Self-pay

## 2022-10-10 MED ORDER — AMOXICILLIN 500 MG PO CAPS
ORAL_CAPSULE | ORAL | 4 refills | Status: DC
  Filled 2022-10-10: qty 4, 1d supply, fill #0

## 2022-10-11 DIAGNOSIS — M25561 Pain in right knee: Secondary | ICD-10-CM | POA: Diagnosis not present

## 2022-10-12 ENCOUNTER — Ambulatory Visit (HOSPITAL_COMMUNITY): Admission: RE | Admit: 2022-10-12 | Payer: Medicare Other | Source: Ambulatory Visit

## 2022-10-16 ENCOUNTER — Observation Stay (HOSPITAL_COMMUNITY)
Admission: EM | Admit: 2022-10-16 | Discharge: 2022-10-18 | Disposition: A | Discharge status: Home / Self Care | Payer: Medicare Other | Attending: Internal Medicine | Admitting: Internal Medicine

## 2022-10-16 ENCOUNTER — Encounter (HOSPITAL_COMMUNITY): Payer: Self-pay | Admitting: Internal Medicine

## 2022-10-16 ENCOUNTER — Emergency Department (HOSPITAL_COMMUNITY): Payer: Medicare Other

## 2022-10-16 ENCOUNTER — Other Ambulatory Visit: Payer: Self-pay

## 2022-10-16 DIAGNOSIS — Z96612 Presence of left artificial shoulder joint: Secondary | ICD-10-CM | POA: Insufficient documentation

## 2022-10-16 DIAGNOSIS — K219 Gastro-esophageal reflux disease without esophagitis: Secondary | ICD-10-CM | POA: Diagnosis not present

## 2022-10-16 DIAGNOSIS — F064 Anxiety disorder due to known physiological condition: Secondary | ICD-10-CM | POA: Diagnosis present

## 2022-10-16 DIAGNOSIS — I5032 Chronic diastolic (congestive) heart failure: Secondary | ICD-10-CM | POA: Diagnosis not present

## 2022-10-16 DIAGNOSIS — F419 Anxiety disorder, unspecified: Secondary | ICD-10-CM | POA: Diagnosis not present

## 2022-10-16 DIAGNOSIS — Z79899 Other long term (current) drug therapy: Secondary | ICD-10-CM | POA: Diagnosis not present

## 2022-10-16 DIAGNOSIS — I10 Essential (primary) hypertension: Secondary | ICD-10-CM | POA: Diagnosis present

## 2022-10-16 DIAGNOSIS — Z7984 Long term (current) use of oral hypoglycemic drugs: Secondary | ICD-10-CM | POA: Diagnosis not present

## 2022-10-16 DIAGNOSIS — I700 Atherosclerosis of aorta: Secondary | ICD-10-CM | POA: Diagnosis not present

## 2022-10-16 DIAGNOSIS — E1169 Type 2 diabetes mellitus with other specified complication: Secondary | ICD-10-CM | POA: Diagnosis not present

## 2022-10-16 DIAGNOSIS — G20C Parkinsonism, unspecified: Secondary | ICD-10-CM

## 2022-10-16 DIAGNOSIS — Z794 Long term (current) use of insulin: Secondary | ICD-10-CM | POA: Diagnosis not present

## 2022-10-16 DIAGNOSIS — I959 Hypotension, unspecified: Secondary | ICD-10-CM | POA: Diagnosis not present

## 2022-10-16 DIAGNOSIS — I6930 Unspecified sequelae of cerebral infarction: Secondary | ICD-10-CM

## 2022-10-16 DIAGNOSIS — G4733 Obstructive sleep apnea (adult) (pediatric): Secondary | ICD-10-CM | POA: Diagnosis present

## 2022-10-16 DIAGNOSIS — I517 Cardiomegaly: Secondary | ICD-10-CM | POA: Diagnosis not present

## 2022-10-16 DIAGNOSIS — M7989 Other specified soft tissue disorders: Secondary | ICD-10-CM

## 2022-10-16 DIAGNOSIS — R519 Headache, unspecified: Secondary | ICD-10-CM | POA: Diagnosis not present

## 2022-10-16 DIAGNOSIS — I110 Hypertensive heart disease with heart failure: Secondary | ICD-10-CM | POA: Diagnosis not present

## 2022-10-16 DIAGNOSIS — R0789 Other chest pain: Secondary | ICD-10-CM | POA: Diagnosis not present

## 2022-10-16 DIAGNOSIS — R079 Chest pain, unspecified: Secondary | ICD-10-CM | POA: Diagnosis not present

## 2022-10-16 DIAGNOSIS — G4489 Other headache syndrome: Secondary | ICD-10-CM | POA: Diagnosis not present

## 2022-10-16 DIAGNOSIS — E038 Other specified hypothyroidism: Secondary | ICD-10-CM

## 2022-10-16 DIAGNOSIS — D508 Other iron deficiency anemias: Secondary | ICD-10-CM

## 2022-10-16 DIAGNOSIS — Z8673 Personal history of transient ischemic attack (TIA), and cerebral infarction without residual deficits: Secondary | ICD-10-CM

## 2022-10-16 DIAGNOSIS — G214 Vascular parkinsonism: Secondary | ICD-10-CM | POA: Diagnosis present

## 2022-10-16 DIAGNOSIS — Z952 Presence of prosthetic heart valve: Secondary | ICD-10-CM | POA: Insufficient documentation

## 2022-10-16 DIAGNOSIS — Z6831 Body mass index (BMI) 31.0-31.9, adult: Secondary | ICD-10-CM | POA: Insufficient documentation

## 2022-10-16 DIAGNOSIS — R52 Pain, unspecified: Secondary | ICD-10-CM | POA: Diagnosis present

## 2022-10-16 DIAGNOSIS — G219 Secondary parkinsonism, unspecified: Secondary | ICD-10-CM | POA: Diagnosis not present

## 2022-10-16 DIAGNOSIS — E039 Hypothyroidism, unspecified: Secondary | ICD-10-CM | POA: Insufficient documentation

## 2022-10-16 DIAGNOSIS — I5033 Acute on chronic diastolic (congestive) heart failure: Secondary | ICD-10-CM

## 2022-10-16 DIAGNOSIS — E669 Obesity, unspecified: Secondary | ICD-10-CM | POA: Diagnosis not present

## 2022-10-16 DIAGNOSIS — Z955 Presence of coronary angioplasty implant and graft: Secondary | ICD-10-CM | POA: Diagnosis not present

## 2022-10-16 DIAGNOSIS — I25118 Atherosclerotic heart disease of native coronary artery with other forms of angina pectoris: Secondary | ICD-10-CM | POA: Diagnosis not present

## 2022-10-16 DIAGNOSIS — I672 Cerebral atherosclerosis: Secondary | ICD-10-CM | POA: Diagnosis not present

## 2022-10-16 DIAGNOSIS — I480 Paroxysmal atrial fibrillation: Secondary | ICD-10-CM | POA: Insufficient documentation

## 2022-10-16 DIAGNOSIS — R42 Dizziness and giddiness: Secondary | ICD-10-CM | POA: Diagnosis not present

## 2022-10-16 DIAGNOSIS — I69354 Hemiplegia and hemiparesis following cerebral infarction affecting left non-dominant side: Secondary | ICD-10-CM | POA: Insufficient documentation

## 2022-10-16 DIAGNOSIS — E118 Type 2 diabetes mellitus with unspecified complications: Secondary | ICD-10-CM

## 2022-10-16 DIAGNOSIS — Z7901 Long term (current) use of anticoagulants: Secondary | ICD-10-CM | POA: Diagnosis not present

## 2022-10-16 DIAGNOSIS — G8194 Hemiplegia, unspecified affecting left nondominant side: Secondary | ICD-10-CM | POA: Diagnosis present

## 2022-10-16 DIAGNOSIS — D509 Iron deficiency anemia, unspecified: Secondary | ICD-10-CM | POA: Diagnosis not present

## 2022-10-16 DIAGNOSIS — I5030 Unspecified diastolic (congestive) heart failure: Secondary | ICD-10-CM | POA: Diagnosis present

## 2022-10-16 LAB — URINALYSIS, ROUTINE W REFLEX MICROSCOPIC
Bilirubin Urine: NEGATIVE
Glucose, UA: NEGATIVE mg/dL
Hgb urine dipstick: NEGATIVE
Ketones, ur: NEGATIVE mg/dL
Leukocytes,Ua: NEGATIVE
Nitrite: NEGATIVE
Protein, ur: NEGATIVE mg/dL
Specific Gravity, Urine: 1.026 (ref 1.005–1.030)
pH: 7 (ref 5.0–8.0)

## 2022-10-16 LAB — CBC WITH DIFFERENTIAL/PLATELET
Abs Immature Granulocytes: 0.02 10*3/uL (ref 0.00–0.07)
Basophils Absolute: 0.1 10*3/uL (ref 0.0–0.1)
Basophils Relative: 1 %
Eosinophils Absolute: 0.1 10*3/uL (ref 0.0–0.5)
Eosinophils Relative: 1 %
HCT: 36.3 % (ref 36.0–46.0)
Hemoglobin: 11.2 g/dL — ABNORMAL LOW (ref 12.0–15.0)
Immature Granulocytes: 0 %
Lymphocytes Relative: 26 %
Lymphs Abs: 1.5 10*3/uL (ref 0.7–4.0)
MCH: 20.5 pg — ABNORMAL LOW (ref 26.0–34.0)
MCHC: 30.9 g/dL (ref 30.0–36.0)
MCV: 66.5 fL — ABNORMAL LOW (ref 80.0–100.0)
Monocytes Absolute: 0.4 10*3/uL (ref 0.1–1.0)
Monocytes Relative: 6 %
Neutro Abs: 3.8 10*3/uL (ref 1.7–7.7)
Neutrophils Relative %: 66 %
Platelets: 169 10*3/uL (ref 150–400)
RBC: 5.46 MIL/uL — ABNORMAL HIGH (ref 3.87–5.11)
RDW: 15.7 % — ABNORMAL HIGH (ref 11.5–15.5)
WBC: 5.8 10*3/uL (ref 4.0–10.5)
nRBC: 0 % (ref 0.0–0.2)

## 2022-10-16 LAB — BRAIN NATRIURETIC PEPTIDE: B Natriuretic Peptide: 182.5 pg/mL — ABNORMAL HIGH (ref 0.0–100.0)

## 2022-10-16 LAB — CBG MONITORING, ED
Glucose-Capillary: 106 mg/dL — ABNORMAL HIGH (ref 70–99)
Glucose-Capillary: 91 mg/dL (ref 70–99)

## 2022-10-16 LAB — BASIC METABOLIC PANEL
Anion gap: 18 — ABNORMAL HIGH (ref 5–15)
BUN: 17 mg/dL (ref 8–23)
CO2: 17 mmol/L — ABNORMAL LOW (ref 22–32)
Calcium: 10.5 mg/dL — ABNORMAL HIGH (ref 8.9–10.3)
Chloride: 104 mmol/L (ref 98–111)
Creatinine, Ser: 0.87 mg/dL (ref 0.44–1.00)
GFR, Estimated: >60 mL/min (ref >60)
Glucose, Bld: 130 mg/dL — ABNORMAL HIGH (ref 70–99)
Potassium: 4.4 mmol/L (ref 3.5–5.1)
Sodium: 139 mmol/L (ref 135–145)

## 2022-10-16 LAB — LACTIC ACID, PLASMA: Lactic Acid, Venous: 2.3 mmol/L (ref 0.5–1.9)

## 2022-10-16 LAB — TROPONIN I (HIGH SENSITIVITY)
Troponin I (High Sensitivity): 13 ng/L (ref <18)
Troponin I (High Sensitivity): 13 ng/L (ref <18)

## 2022-10-16 MED ORDER — APIXABAN 5 MG PO TABS
5.0000 mg | ORAL_TABLET | Freq: Two times a day (BID) | ORAL | Status: DC
  Administered 2022-10-16 – 2022-10-18 (×4): 5 mg via ORAL
  Filled 2022-10-16 (×4): qty 1

## 2022-10-16 MED ORDER — METHOCARBAMOL 500 MG PO TABS: 500.0000 mg | ORAL_TABLET | Freq: Four times a day (QID) | ORAL | Status: DC

## 2022-10-16 MED ORDER — HYDROCODONE-ACETAMINOPHEN 7.5-325 MG PO TABS
1.0000 | ORAL_TABLET | Freq: Four times a day (QID) | ORAL | Status: DC | PRN
  Administered 2022-10-16 – 2022-10-18 (×5): 1 via ORAL
  Filled 2022-10-16 (×5): qty 1

## 2022-10-16 MED ORDER — ACETAMINOPHEN 650 MG RE SUPP: 650.0000 mg | Freq: Four times a day (QID) | RECTAL | Status: DC | PRN

## 2022-10-16 MED ORDER — FENTANYL CITRATE PF 50 MCG/ML IJ SOSY
25.0000 ug | PREFILLED_SYRINGE | INTRAMUSCULAR | Status: DC | PRN
  Administered 2022-10-16 – 2022-10-17 (×3): 25 ug via INTRAVENOUS
  Filled 2022-10-16 (×3): qty 1

## 2022-10-16 MED ORDER — SODIUM CHLORIDE 0.9 % IV BOLUS
500.0000 mL | Freq: Once | INTRAVENOUS | Status: AC
  Administered 2022-10-16: 500 mL via INTRAVENOUS

## 2022-10-16 MED ORDER — ACETAMINOPHEN 325 MG PO TABS: 650.0000 mg | ORAL_TABLET | Freq: Four times a day (QID) | ORAL | Status: DC | PRN

## 2022-10-16 MED ORDER — SODIUM CHLORIDE 0.9% FLUSH
3.0000 mL | Freq: Two times a day (BID) | INTRAVENOUS | Status: DC
  Administered 2022-10-16 – 2022-10-18 (×3): 3 mL via INTRAVENOUS

## 2022-10-16 MED ORDER — FENTANYL CITRATE PF 50 MCG/ML IJ SOSY
50.0000 ug | PREFILLED_SYRINGE | Freq: Once | INTRAMUSCULAR | Status: AC
  Administered 2022-10-16: 50 ug via INTRAVENOUS
  Filled 2022-10-16: qty 1

## 2022-10-16 MED ORDER — DILTIAZEM HCL ER COATED BEADS 120 MG PO CP24
120.0000 mg | ORAL_CAPSULE | Freq: Every day | ORAL | Status: DC
  Administered 2022-10-17 – 2022-10-18 (×2): 120 mg via ORAL
  Filled 2022-10-16 (×3): qty 1

## 2022-10-16 MED ORDER — POLYETHYLENE GLYCOL 3350 17 G PO PACK: 17.0000 g | PACK | Freq: Every day | ORAL | Status: DC | PRN

## 2022-10-16 MED ORDER — TORSEMIDE 20 MG PO TABS
40.0000 mg | ORAL_TABLET | Freq: Every day | ORAL | Status: DC
  Administered 2022-10-17 – 2022-10-18 (×2): 40 mg via ORAL
  Filled 2022-10-16 (×2): qty 2

## 2022-10-16 MED ORDER — IOHEXOL 350 MG/ML SOLN
75.0000 mL | Freq: Once | INTRAVENOUS | Status: AC | PRN
  Administered 2022-10-16: 75 mL via INTRAVENOUS

## 2022-10-16 MED ORDER — LEVOTHYROXINE SODIUM 25 MCG PO TABS
137.0000 ug | ORAL_TABLET | Freq: Every day | ORAL | Status: DC
  Administered 2022-10-17 – 2022-10-18 (×2): 137 ug via ORAL
  Filled 2022-10-16 (×2): qty 1

## 2022-10-16 MED ORDER — PANTOPRAZOLE SODIUM 40 MG PO TBEC
40.0000 mg | DELAYED_RELEASE_TABLET | Freq: Every day | ORAL | Status: DC
  Administered 2022-10-17 – 2022-10-18 (×2): 40 mg via ORAL
  Filled 2022-10-16 (×2): qty 1

## 2022-10-16 MED ORDER — ESCITALOPRAM OXALATE 10 MG PO TABS
20.0000 mg | ORAL_TABLET | Freq: Every day | ORAL | Status: DC
  Administered 2022-10-17 – 2022-10-18 (×2): 20 mg via ORAL
  Filled 2022-10-16 (×3): qty 2

## 2022-10-16 NOTE — Progress Notes (Signed)
Pt stated she doesn't wear CPAP at home and doesn't want to wear CPAP for tonight

## 2022-10-16 NOTE — ED Notes (Signed)
ED TO INPATIENT HANDOFF REPORT  ED Nurse Name and Phone #: [phone] Julio Zappia  S Name/Age/Gender Breanna Webster 77 y.o. female Room/Bed: 046C/046C  Code Status   Code Status: Full Code  Home/SNF/Other Home Patient oriented to: self, place, time, and situation Is this baseline? Yes   Triage Complete: Triage complete  Chief Complaint Intractable pain [R52]  Triage Note Pt to the ed from home with a CC of chest pain with sob x 2 days. Pain increases with a deep breath as well as ambulation.  Pt relays headache with dizziness. Pt had recent fall and has a broken clavicle.  No LOC today   Allergies No Known Allergies  Level of Care/Admitting Diagnosis ED Disposition     ED Disposition  Admit   Condition  --   Comment  Hospital Area: [facility] [[id number]]  Level of Care: Med-Surg [16]  May place patient in observation at [facility] or [facility] if equivalent level of care is available:: No  Covid Evaluation: Asymptomatic - no recent exposure (last 10 days) testing not required  Diagnosis: Intractable pain [708987]  Admitting Physician: Synetta Fail [[id number]]  Attending Physician: Synetta Fail [[id number]]          B Medical/Surgery History Past Medical History:  Diagnosis Date   Anxiety    Arthritis    "back" (04/22/2018)   Atrial fibrillation with RVR (HCC) 10/21/2018   Back pain    Bacteremia due to Streptococcus Salivarius 10/07/2018   Blood transfusion without reported diagnosis    CAD (coronary artery disease)    a. 03/2018 s/p PCI/DES to the RCA (3.0x15 Onyx DES).   Carotid artery stenosis    Mild   Cerebellar stroke, acute (HCC) 10/21/2018   Cerebrovascular accident (CVA) due to embolism of right posterior cerebral artery (HCC) 08/03/2020   Chest pain    CHF (congestive heart failure) (HCC)    Chronic lower back pain    Cirrhosis (HCC)    Colon polyps    Diverticulitis    Diverticulosis    Esophageal thickening     seen on pre TAVR CT scan, also questionable cirrhosis. MRI recommended. Will refer to GI   GERD (gastroesophageal reflux disease)    Grave's disease    H/O total shoulder replacement, left 06/01/2022   Heart murmur    History of cardioembolic stroke 03/29/2020   History of colonic polyps 05/22/2017   History of hiatal hernia    Hyperlipidemia    Hypertension    Hypothyroidism    IBS (irritable bowel syndrome)    Osteopenia    Parkinson's syndrome 04/23/2020   Pulmonary nodules    seen on pre TAVR CT. likley benign. no follow up recommended if pt low risk.   S/P TAVR (transcatheter aortic valve replacement)    Shortness of breath on exertion    Stroke (HCC) 2021   x2   Thalassemia minor    Thyroid disease    Type II diabetes mellitus (HCC)    Past Surgical History:  Procedure Laterality Date   76 HOUR PH STUDY N/A 03/03/2018   Procedure: 24 HOUR PH STUDY;  Surgeon: Napoleon Form, MD;  Location: [facility] ENDOSCOPY;  Service: Endoscopy;  Laterality: N/A;   COLONOSCOPY  2023   COLONOSCOPY W/ BIOPSIES AND POLYPECTOMY     CORONARY ANGIOGRAPHY Right 04/21/2018   Procedure: CORONARY ANGIOGRAPHY (CATH LAB);  Surgeon: Lyn Records, MD;  Location: [facility] INVASIVE CV LAB;  Service: Cardiovascular;  Laterality: Right;  CORONARY STENT INTERVENTION N/A 04/22/2018   Procedure: CORONARY STENT INTERVENTION;  Surgeon: Lyn Records, MD;  Location: [facility] INVASIVE CV LAB;  Service: Cardiovascular;  Laterality: N/A;   DILATION AND CURETTAGE OF UTERUS     ESOPHAGEAL MANOMETRY N/A 03/03/2018   Procedure: ESOPHAGEAL MANOMETRY (EM);  Surgeon: Napoleon Form, MD;  Location: [facility] ENDOSCOPY;  Service: Endoscopy;  Laterality: N/A;   HYSTEROSCOPY     fibroids   LAPAROSCOPIC CHOLECYSTECTOMY  1985   LAPAROSCOPY     fibroids   NISSEN FUNDOPLICATION  1990s   POLYPECTOMY     REVERSE SHOULDER ARTHROPLASTY Left 06/01/2022   Procedure: REVERSE SHOULDER ARTHROPLASTY;  Surgeon: Beverely Low, MD;  Location: [facility]  ORS;  Service: Orthopedics;  Laterality: Left;  choice with interscalene block   RIGHT/LEFT HEART CATH AND CORONARY ANGIOGRAPHY N/A 02/20/2018   Procedure: RIGHT/LEFT HEART CATH AND CORONARY ANGIOGRAPHY;  Surgeon: Lyn Records, MD;  Location: [facility] INVASIVE CV LAB;  Service: Cardiovascular;  Laterality: N/A;   TEE WITHOUT CARDIOVERSION N/A 07/08/2018   Procedure: TRANSESOPHAGEAL ECHOCARDIOGRAM (TEE);  Surgeon: Kathleene Hazel, MD;  Location: [facility] OR;  Service: Open Heart Surgery;  Laterality: N/A;   TEE WITHOUT CARDIOVERSION  10/07/2018   TEE WITHOUT CARDIOVERSION N/A 10/07/2018   Procedure: TRANSESOPHAGEAL ECHOCARDIOGRAM (TEE);  Surgeon: Jake Bathe, MD;  Location: [facility] ENDOSCOPY;  Service: Cardiovascular;  Laterality: N/A;   TONSILLECTOMY     age 49   TRANSCATHETER AORTIC VALVE REPLACEMENT, TRANSFEMORAL N/A 07/08/2018   Procedure: TRANSCATHETER AORTIC VALVE REPLACEMENT, TRANSFEMORAL;  Surgeon: Kathleene Hazel, MD;  Location: [facility] OR;  Service: Open Heart Surgery;  Laterality: N/A;     A IV Location/Drains/Wounds Patient Lines/Drains/Airways Status     Active Line/Drains/Airways     Name Placement date Placement time Site Days   Peripheral IV 10/16/22 20 G Right Antecubital 10/16/22  1340  Antecubital  less than 1            Intake/Output Last 24 hours  Intake/Output Summary (Last 24 hours) at 10/16/2022 2113 Last data filed at 10/16/2022 2102 Gross per 24 hour  Intake 3 ml  Output --  Net 3 ml    Labs/Imaging Results for orders placed or performed during the hospital encounter of 10/16/22 (from the past 48 hour(s))  Basic metabolic panel     Status: Abnormal   Collection Time: 10/16/22  1:13 PM  Result Value Ref Range   Sodium 139 135 - 145 mmol/L   Potassium 4.4 3.5 - 5.1 mmol/L   Chloride 104 98 - 111 mmol/L   CO2 17 (L) 22 - 32 mmol/L   Glucose, Bld 130 (H) 70 - 99 mg/dL    Comment: Glucose reference range applies only to samples taken after fasting for  at least 8 hours.   BUN 17 8 - 23 mg/dL   Creatinine, Ser 5.28 0.44 - 1.00 mg/dL   Calcium 41.3 (H) 8.9 - 10.3 mg/dL   GFR, Estimated >24 >40 mL/min    Comment: (NOTE) Calculated using the CKD-EPI Creatinine Equation (2021)    Anion gap 18 (H) 5 - 15    Comment: Performed at [facility], [address], [place], [place] [address]  CBC with Differential     Status: Abnormal   Collection Time: 10/16/22  1:13 PM  Result Value Ref Range   WBC 5.8 4.0 - 10.5 K/uL   RBC 5.46 (H) 3.87 - 5.11 MIL/uL   Hemoglobin 11.2 (L) 12.0 - 15.0 g/dL  HCT 36.3 36.0 - 46.0 %   MCV 66.5 (L) 80.0 - 100.0 fL   MCH 20.5 (L) 26.0 - 34.0 pg   MCHC 30.9 30.0 - 36.0 g/dL   RDW 40.9 (H) 81.1 - 91.4 %   Platelets 169 150 - 400 K/uL    Comment: REPEATED TO VERIFY   nRBC 0.0 0.0 - 0.2 %   Neutrophils Relative % 66 %   Neutro Abs 3.8 1.7 - 7.7 K/uL   Lymphocytes Relative 26 %   Lymphs Abs 1.5 0.7 - 4.0 K/uL   Monocytes Relative 6 %   Monocytes Absolute 0.4 0.1 - 1.0 K/uL   Eosinophils Relative 1 %   Eosinophils Absolute 0.1 0.0 - 0.5 K/uL   Basophils Relative 1 %   Basophils Absolute 0.1 0.0 - 0.1 K/uL   Immature Granulocytes 0 %   Abs Immature Granulocytes 0.02 0.00 - 0.07 K/uL    Comment: Performed at [facility], [address], [place], [place] [address]  Troponin I (High Sensitivity)     Status: None   Collection Time: 10/16/22  1:13 PM  Result Value Ref Range   Troponin I (High Sensitivity) 13 <18 ng/L    Comment: (NOTE) Elevated high sensitivity troponin I (hsTnI) values and significant  changes across serial measurements may suggest ACS but many other  chronic and acute conditions are known to elevate hsTnI results.  Refer to the "Links" section for chest pain algorithms and additional  guidance. Performed at [facility], [address] [place], [place] [address]   CBG monitoring, ED     Status: Abnormal   Collection Time: 10/16/22  2:54 PM  Result Value Ref Range    Glucose-Capillary 106 (H) 70 - 99 mg/dL    Comment: Glucose reference range applies only to samples taken after fasting for at least 8 hours.  Troponin I (High Sensitivity)     Status: None   Collection Time: 10/16/22  4:28 PM  Result Value Ref Range   Troponin I (High Sensitivity) 13 <18 ng/L    Comment: (NOTE) Elevated high sensitivity troponin I (hsTnI) values and significant  changes across serial measurements may suggest ACS but many other  chronic and acute conditions are known to elevate hsTnI results.  Refer to the "Links" section for chest pain algorithms and additional  guidance. Performed at [facility], [address], [place], [place] [address]   CBG monitoring, ED     Status: None   Collection Time: 10/16/22  4:29 PM  Result Value Ref Range   Glucose-Capillary 91 70 - 99 mg/dL    Comment: Glucose reference range applies only to samples taken after fasting for at least 8 hours.  Urinalysis, Routine w reflex microscopic -Urine, Clean Catch     Status: None   Collection Time: 10/16/22  5:40 PM  Result Value Ref Range   Color, Urine YELLOW YELLOW   APPearance CLEAR CLEAR   Specific Gravity, Urine 1.026 1.005 - 1.030   pH 7.0 5.0 - 8.0   Glucose, UA NEGATIVE NEGATIVE mg/dL   Hgb urine dipstick NEGATIVE NEGATIVE   Bilirubin Urine NEGATIVE NEGATIVE   Ketones, ur NEGATIVE NEGATIVE mg/dL   Protein, ur NEGATIVE NEGATIVE mg/dL   Nitrite NEGATIVE NEGATIVE   Leukocytes,Ua NEGATIVE NEGATIVE    Comment: Performed at [facility], [address], [place], [place] [address]   *Note: Due to a large number of results and/or encounters for the requested  time period, some results have not been displayed. A complete set of results can be found in Results Review.   CT Head Wo Contrast  Result Date: 10/16/2022 CLINICAL DATA:  Headache, dizziness, recent fall EXAM: CT HEAD WITHOUT CONTRAST TECHNIQUE: Contiguous axial images were obtained from the base of the skull through  the vertex without intravenous contrast. RADIATION DOSE REDUCTION: This exam was performed according to the departmental dose-optimization program which includes automated exposure control, adjustment of the mA and/or kV according to patient size and/or use of iterative reconstruction technique. COMPARISON:  09/01/2022 FINDINGS: Brain: No acute infarct or hemorrhage. Lateral ventricles and midline structures are unremarkable. No acute extra-axial fluid collections. No mass effect. Vascular: Residual intravascular contrast from preceding CT of the chest. Minimal atherosclerosis. Skull: Normal. Negative for fracture or focal lesion. Sinuses/Orbits: No acute finding. Other: None. IMPRESSION: 1. No acute intracranial process. Electronically Signed   By: Sharlet Salina M.D.   On: 10/16/2022 16:35   CT Angio Chest Aorta w/CM &/OR wo/CM  Result Date: 10/16/2022 CLINICAL DATA:  Chest pain, acute aortic syndrome suspected EXAM: CT ANGIOGRAPHY CHEST WITH CONTRAST TECHNIQUE: Multidetector CT imaging of the chest was performed using the standard protocol during bolus administration of intravenous contrast. Multiplanar CT image reconstructions and MIPs were obtained to evaluate the vascular anatomy. RADIATION DOSE REDUCTION: This exam was performed according to the departmental dose-optimization program which includes automated exposure control, adjustment of the mA and/or kV according to patient size and/or use of iterative reconstruction technique. CONTRAST:  75mL OMNIPAQUE IOHEXOL 350 MG/ML SOLN COMPARISON:  CT chest dated 09/01/2022 FINDINGS: Cardiovascular: Preferential opacification of the thoracic aorta. No evidence of thoracic aortic aneurysm or dissection. Status post aortic valve replacement. Multichamber cardiomegaly. No pericardial effusion. No central pulmonary emboli. The proximal common carotid artery not well seen due to beam hardening artifact from the left shoulder arthroplasty. Main branch vessels from the  aorta are otherwise patent. Coronary artery calcifications and aortic atherosclerosis. Mediastinum/Nodes: Imaged thyroid gland without nodules meeting criteria for imaging follow-up by size. Small hiatal hernia. No pathologically enlarged axillary, supraclavicular, mediastinal, or hilar lymph nodes. Lungs/Pleura: The central airways are patent. Bilateral lower lobe subsegmental mucous plugging. Bilateral dependent ground-glass densities. No pneumothorax. No pleural effusion. Upper abdomen: Normal. Musculoskeletal: No acute or abnormal lytic or blastic osseous lesions. Left shoulder arthroplasty. Review of the MIP images confirms the above findings. IMPRESSION: 1. No evidence of thoracic aortic aneurysm or dissection. 2. Bilateral dependent ground-glass densities, likely atelectasis. 3. Multichamber cardiomegaly. 4.  Aortic Atherosclerosis (ICD10-I70.0). Electronically Signed   By: Agustin Cree M.D.   On: 10/16/2022 16:19   DG Chest Port 1 View  Result Date: 10/16/2022 CLINICAL DATA:  Chest pain for 2 days EXAM: PORTABLE CHEST 1 VIEW COMPARISON:  04/25/2022 FINDINGS: Cardiomegaly. Aortic valve prosthesis. Unchanged mediastinal contours. Aortic atherosclerosis. No new focal pulmonary opacity. No pleural effusion or pneumothorax. Interval left reverse shoulder arthroplasty. Degenerative changes in the right shoulder. No acute osseous abnormality. IMPRESSION: No acute cardiopulmonary process. Electronically Signed   By: Wiliam Ke M.D.   On: 10/16/2022 14:47    Pending Labs Unresulted Labs (From admission, onward)     Start     Ordered   10/17/22 0500  Comprehensive metabolic panel  Tomorrow morning,   R        10/16/22 1812   10/17/22 0500  CBC  Tomorrow morning,   R        10/16/22 1812   10/16/22 1819  Lactic acid,  plasma  Once,   R        10/16/22 1818   10/16/22 1740  Brain natriuretic peptide  Once,   URGENT        10/16/22 1739            Vitals/Pain Today's Vitals   10/16/22 1830  10/16/22 1857 10/16/22 2009 10/16/22 2010  BP: (!) 147/124  132/60   Pulse: 71  74   Resp: (!) 21  17   Temp:  98 F (36.7 C) 98.1 F (36.7 C)   TempSrc:      SpO2: 99%  100%   Weight:      Height:      PainSc:    3     Isolation Precautions No active isolations  Medications Medications  levothyroxine (SYNTHROID) tablet 137 mcg (has no administration in time range)  escitalopram (LEXAPRO) tablet 20 mg (has no administration in time range)  torsemide (DEMADEX) tablet 40 mg (has no administration in time range)  diltiazem (CARDIZEM CD) 24 hr capsule 120 mg (has no administration in time range)  pantoprazole (PROTONIX) EC tablet 40 mg (has no administration in time range)  apixaban (ELIQUIS) tablet 5 mg (5 mg Oral Given 10/16/22 2106)  sodium chloride flush (NS) 0.9 % injection 3 mL (3 mLs Intravenous Given 10/16/22 2102)  acetaminophen (TYLENOL) tablet 650 mg (has no administration in time range)    Or  acetaminophen (TYLENOL) suppository 650 mg (has no administration in time range)  polyethylene glycol (MIRALAX / GLYCOLAX) packet 17 g (has no administration in time range)  HYDROcodone-acetaminophen (NORCO) 7.5-325 MG per tablet 1 tablet (has no administration in time range)  fentaNYL (SUBLIMAZE) injection 25 mcg (25 mcg Intravenous Given 10/16/22 2102)  fentaNYL (SUBLIMAZE) injection 50 mcg (50 mcg Intravenous Given 10/16/22 1405)  sodium chloride 0.9 % bolus 500 mL (0 mLs Intravenous Stopped 10/16/22 1858)  iohexol (OMNIPAQUE) 350 MG/ML injection 75 mL (75 mLs Intravenous Contrast Given 10/16/22 1548)  fentaNYL (SUBLIMAZE) injection 50 mcg (50 mcg Intravenous Given 10/16/22 1626)    Mobility walks with device     Focused Assessments Cardiac Assessment Handoff:    Lab Results  Component Value Date   CKTOTAL 24 02/17/2014   TROPONINI 0.83 (HH) 10/04/2018   Lab Results  Component Value Date   DDIMER 0.75 (H) 07/19/2016   Does the Patient currently have chest pain? Yes     R Recommendations: See Admitting Provider Note  Report given to:   Additional Notes: Patient to be admitted for pain control Patient reports "CP" but states it to be under left breast and clavicle. Pt had recent left clavicle sx all cardiac workups negative to include imaging and labs.Pt may possibly have early onset symptoms of shingles per hospital ist note. Pt a/o x 4 ambulates with assist and cane uses bedside commode VS WNL

## 2022-10-16 NOTE — ED Notes (Signed)
Cbg 106 ?

## 2022-10-16 NOTE — ED Triage Notes (Signed)
Pt to the ed from home with a CC of chest pain with sob x 2 days. Pain increases with a deep breath as well as ambulation.  Pt relays headache with dizziness. Pt had recent fall and has a broken clavicle.  No LOC today

## 2022-10-16 NOTE — ED Provider Notes (Signed)
[place] EMERGENCY DEPARTMENT AT [facility] Provider Note   CSN: [id number] Arrival date & time: 10/16/22  1254     History  Chief Complaint  Patient presents with   Chest Pain    Breanna Webster is a 77 y.o. female.  Patient is a 77 year old female who presents with chest pain.  She has a history of GERD, Graves' disease, hypertension, coronary artery disease status post stent placement, diabetes, TAVR.  She is on Eliquis.  She states that 2 days ago she started having a sharp pain in her left chest.  It is under her left breast.  Is been persistent since that time.  Is otherwise nonradiating.  She denies any nausea or vomiting.  She also has had a severe headache since that time.  It is all over her head.  It started gradually and has been gradually getting worse since then.  She denies any recent trauma.  No fevers.  No cough or cold symptoms.  She has some chronic leg swelling which is actually better than it has been given that she uses compression stockings.  She had a recent shoulder fracture about 5 to 6 weeks ago and is status post shoulder replacement surgery.       Home Medications Prior to Admission medications   Medication Sig Start Date End Date Taking? Authorizing Provider  amoxicillin (AMOXIL) 500 MG capsule Take 2 capsules by mouth 1 hour before dentist or other procedures and 2 capsules by mouth after visit 10/10/22     apixaban (ELIQUIS) 5 MG TABS tablet Take 1 tablet (5 mg total) by mouth 2 (two) times daily. 01/02/22   Lyn Records, MD  b complex vitamins capsule Take 1 capsule by mouth daily.    [provider]  BD PEN NEEDLE NANO 2ND GEN 32G X 4 MM MISC  11/26/20   [provider]  Continuous Blood Gluc Receiver (FREESTYLE LIBRE READER) DEVI Use as directed 03/14/22     Continuous Glucose Sensor (FREESTYLE LIBRE 3 SENSOR) MISC Place one sensor every 14 days 03/14/22     cyclobenzaprine (FLEXERIL) 5 MG tablet Take 1 tablet (5 mg  total) by mouth every evening. 08/28/22     diltiazem (CARDIZEM CD) 120 MG 24 hr capsule Take 1 capsule (120 mg total) by mouth daily. 01/02/22   Lyn Records, MD  escitalopram (LEXAPRO) 20 MG tablet TAKE ONE TABLET BY MOUTH DAILY 06/15/22   Nafziger, Kandee Keen, NP  Evolocumab (REPATHA SURECLICK) 140 MG/ML SOAJ Inject 1 dose into the skin every 14 (fourteen) days. 03/27/22   Lyn Records, MD  insulin NPH Human (HUMULIN N) 100 UNIT/ML injection Inject 20 units under the skin in the morning and 14 units in evening 02/20/22     Insulin Pen Needle (UNIFINE PENTIPS) 32G X 4 MM MISC Inject 4 times subcutaneously 06/23/21     insulin regular (HUMULIN R) 100 units/mL injection Inject 10 Units total into the skin 3 (three) times daily before meals. 02/20/22     Insulin Syringe-Needle U-100 (ULTICARE INSULIN SYRINGE) 31G X 5/16" 1 ML MISC Inject 4 times daily subcutaneously 01/12/22   Shirline Frees, NP  metFORMIN (GLUCOPHAGE-XR) 500 MG 24 hr tablet Take 1 tablet by mouth 2 times daily 08/29/22     methocarbamol (ROBAXIN) 500 MG tablet Take 1 tablet (500 mg total) by mouth 4 (four) times daily. 09/27/22     Multiple Vitamin (MULITIVITAMIN WITH MINERALS) TABS Take 1 tablet by mouth daily.  [provider]  nitroGLYCERIN (NITROSTAT) 0.4 MG SL tablet Place 1 tablet (0.4 mg total) under the tongue every 5 (five) minutes as needed for chest pain. 11/14/21   Lyn Records, MD  omeprazole (PRILOSEC) 20 MG capsule Take 1 capsule (20 mg total) by mouth daily. 06/07/22   [name], Willaim Rayas, MD  potassium chloride SA (KLOR-CON M) 20 MEQ tablet Take 1 tablet (20 mEq total) by mouth twice daily for 3 days only, then decrease to taking 1 tablet (20 mEq total) by mouth daily thereafter.  Take in concurrence with torsemide. 06/26/22   Meriam Sprague, MD  primidone (MYSOLINE) 50 MG tablet Take 1 tablet (50 mg total) by mouth every morning. 09/27/22   Dohmeier, Porfirio Mylar, MD  SYNTHROID 137 MCG tablet Take 1 tablet (137 mcg total)  by mouth every morning on empty stomach. 08/01/22     torsemide (DEMADEX) 20 MG tablet Take 2 tablets (40 mg total) by mouth daily. 10/08/22   Ronney Asters, NP      Allergies    Patient has no known allergies.    Review of Systems   Review of Systems  Constitutional:  Negative for chills, diaphoresis, fatigue and fever.  HENT:  Negative for congestion, rhinorrhea and sneezing.   Eyes: Negative.   Respiratory:  Positive for shortness of breath. Negative for cough and chest tightness.   Cardiovascular:  Positive for chest pain. Negative for leg swelling.  Gastrointestinal:  Negative for abdominal pain, blood in stool, diarrhea, nausea and vomiting.  Genitourinary:  Negative for difficulty urinating, flank pain, frequency and hematuria.  Musculoskeletal:  Negative for arthralgias and back pain.  Skin:  Negative for rash.  Neurological:  Negative for dizziness, speech difficulty, weakness, numbness and headaches.    Physical Exam Updated Vital Signs BP 123/60   Pulse 68   Temp 98.7 F (37.1 C) (Oral)   Resp (!) 21   Ht 5\' 5"  (1.651 m)   Wt 80 kg   SpO2 95%   BMI 29.35 kg/m  Physical Exam Constitutional:      Appearance: She is well-developed.  HENT:     Head: Normocephalic and atraumatic.  Eyes:     Pupils: Pupils are equal, round, and reactive to light.  Cardiovascular:     Rate and Rhythm: Normal rate and regular rhythm.     Heart sounds: Normal heart sounds.  Pulmonary:     Effort: Pulmonary effort is normal. No respiratory distress.     Breath sounds: Normal breath sounds. No wheezing or rales.  Chest:     Chest wall: Tenderness (reproducible tenderness to left chest wall, no crepitus or deformity.  no underlying rash) present.  Abdominal:     General: Bowel sounds are normal.     Palpations: Abdomen is soft.     Tenderness: There is no abdominal tenderness. There is no guarding or rebound.  Musculoskeletal:        General: Normal range of motion.     Cervical  back: Normal range of motion and neck supple.  Lymphadenopathy:     Cervical: No cervical adenopathy.  Skin:    General: Skin is warm and dry.     Findings: No rash.  Neurological:     Mental Status: She is alert and oriented to person, place, and time.     ED Results / Procedures / Treatments   Labs (all labs ordered are listed, but only abnormal results are displayed) Labs Reviewed  BASIC METABOLIC PANEL -  Abnormal; Notable for the following components:      Result Value   CO2 17 (*)    Glucose, Bld 130 (*)    Calcium 10.5 (*)    Anion gap 18 (*)    All other components within normal limits  CBC WITH DIFFERENTIAL/PLATELET - Abnormal; Notable for the following components:   RBC 5.46 (*)    Hemoglobin 11.2 (*)    MCV 66.5 (*)    MCH 20.5 (*)    RDW 15.7 (*)    All other components within normal limits  TROPONIN I (HIGH SENSITIVITY)  TROPONIN I (HIGH SENSITIVITY)    EKG None  Radiology DG Chest Port 1 View  Result Date: 10/16/2022 CLINICAL DATA:  Chest pain for 2 days EXAM: PORTABLE CHEST 1 VIEW COMPARISON:  04/25/2022 FINDINGS: Cardiomegaly. Aortic valve prosthesis. Unchanged mediastinal contours. Aortic atherosclerosis. No new focal pulmonary opacity. No pleural effusion or pneumothorax. Interval left reverse shoulder arthroplasty. Degenerative changes in the right shoulder. No acute osseous abnormality. IMPRESSION: No acute cardiopulmonary process. Electronically Signed   By: Wiliam Ke M.D.   On: 10/16/2022 14:47    Procedures Procedures    Medications Ordered in ED Medications  fentaNYL (SUBLIMAZE) injection 50 mcg (50 mcg Intravenous Given 10/16/22 1405)    ED Course/ Medical Decision Making/ A&P                             Medical Decision Making Amount and/or Complexity of Data Reviewed Labs: ordered. Radiology: ordered.  Risk Prescription drug management.   Patient is a 77 year old who presents with chest pain.  She has sharp intense pain in  her left chest.  Is been going on for 2 days.  Is been worsening for the last 2 days.  Some mild associated shortness of breath.  Labs reviewed and are nonconcerning.  Chest x-ray two-view does not show any acute abnormalities.  No pneumothorax.  No pneumonia.  Awaiting CTA of the chest.  Will also check a head CT given her new onset headache on anticoagulants.  Her EKG does not show any ischemic changes.  Care turned over to Dr. Jacqulyn Bath pending CT results and reevaluation.  Final Clinical Impression(s) / ED Diagnoses Final diagnoses:  Chest pain, unspecified type    Rx / DC Orders ED Discharge Orders     None         Rolan Bucco, MD 10/16/22 1538

## 2022-10-16 NOTE — ED Provider Notes (Signed)
Blood pressure 123/60, pulse 68, temperature 98.7 F (37.1 C), temperature source Oral, resp. rate (!) 21, height 5\' 5"  (1.651 m), weight 80 kg, SpO2 95 %.  Assuming care from Dr. Fredderick Phenix.  In short, Breanna Webster is a 77 y.o. female with a chief complaint of Chest Pain .  Refer to the original H&P for additional details.  The current plan of care is to follow up on CTA and reassess.  05:10 PM  CT aorta without acute finding. Second troponin pending.   Plan to admit for CP observation. Patient in agreement.   Discussed patient's case with [facility] to request admission. Patient and family (if present) updated with plan.   I reviewed all nursing notes, vitals, pertinent old records, EKGs, labs, imaging (as available).    Maia Plan, MD 10/18/22 [phone]

## 2022-10-16 NOTE — H&P (Addendum)
History and Physical   Breanna Webster JXB:[id number] DOB: 1945-08-29 DOA: 10/16/2022  PCP: Ronney Asters, NP   Patient coming from: Home  Chief Complaint: Chest pain  HPI: Breanna Webster is a 77 y.o. female with medical history significant of diabetes, OSA on BiPAP, secondary vascular parkinsonism, CVA with left-sided hemiparesis, status post TAVR, atrial fibrillation, anemia, hypothyroidism, CAD status post DES, GERD, hypertension, anxiety, CHF presenting with chest pain.  Patient reports 2 days of worsening sharp pain of the left chest under her left breast.  Pain is persistent and nonradiating.  Also reports some worsening headache.  She denies any trauma to the site.  States she did have shoulder surgery 5 to 6 weeks ago.  Has chronic edema but this is improved from baseline.  She denies fevers, chills, shortness of breath, abdominal pain, constipation, diarrhea, nausea, vomiting.  ED Course: Vital signs in the ED notable for blood pressure in the 110s to 130s systolic.  Lab workup included BMP with bicarb 17, gap 18, glucose 130, calcium 10.5.  CBC with hemoglobin 11.2.  Troponin negative x 2.  BNP pending.  Urinalysis pending.  Chest x-ray showed no acute normality.  CTA of the aorta showed no evidence of aneurysm or dissection.  Showed bilateral groundglass opacity of the lungs suspicious for atelectasis, showed cardiomegaly.  Patient received fentanyl and half a liter of IV fluids in the ED.  Reportedly pain only transiently improved on fentanyl patient uncomfortable going home.  Review of Systems: As per HPI otherwise all other systems reviewed and are negative.  Past Medical History:  Diagnosis Date   Anxiety    Arthritis    "back" (04/22/2018)   Atrial fibrillation with RVR (HCC) 10/21/2018   Back pain    Bacteremia due to Streptococcus Salivarius 10/07/2018   Blood transfusion without reported diagnosis    CAD (coronary artery disease)    a. 03/2018 s/p PCI/DES to  the RCA (3.0x15 Onyx DES).   Carotid artery stenosis    Mild   Cerebellar stroke, acute (HCC) 10/21/2018   Cerebrovascular accident (CVA) due to embolism of right posterior cerebral artery (HCC) 08/03/2020   Chest pain    CHF (congestive heart failure) (HCC)    Chronic lower back pain    Cirrhosis (HCC)    Colon polyps    Diverticulitis    Diverticulosis    Esophageal thickening    seen on pre TAVR CT scan, also questionable cirrhosis. MRI recommended. Will refer to GI   GERD (gastroesophageal reflux disease)    Grave's disease    H/O total shoulder replacement, left 06/01/2022   Heart murmur    History of cardioembolic stroke 03/29/2020   History of colonic polyps 05/22/2017   History of hiatal hernia    Hyperlipidemia    Hypertension    Hypothyroidism    IBS (irritable bowel syndrome)    Osteopenia    Parkinson's syndrome 04/23/2020   Pulmonary nodules    seen on pre TAVR CT. likley benign. no follow up recommended if pt low risk.   S/P TAVR (transcatheter aortic valve replacement)    Shortness of breath on exertion    Stroke (HCC) 2021   x2   Thalassemia minor    Thyroid disease    Type II diabetes mellitus (HCC)     Past Surgical History:  Procedure Laterality Date   27 HOUR PH STUDY N/A 03/03/2018   Procedure: 24 HOUR PH STUDY;  Surgeon: Napoleon Form, MD;  Location:  [facility]OS[facility];  Service: Endoscopy;  Laterality: N/A;   COLONOSCOPY  2023   COLONOSCOPY W/ BIOPSIES AND POLYPECTOMY     CORONARY ANGIOGRAPHY Right 04/21/2018   Procedure: CORONARY ANGIOGRAPHY (CATH LAB);  Surgeon: Lyn Records, MD;  Location: [facility] INVASIVE CV LAB;  Service: Cardiovascular;  Laterality: Right;   CORONARY STENT INTERVENTION N/A 04/22/2018   Procedure: CORONARY STENT INTERVENTION;  Surgeon: Lyn Records, MD;  Location: [facility] INVASIVE CV LAB;  Service: Cardiovascular;  Laterality: N/A;   DILATION AND CURETTAGE OF UTERUS     ESOPHAGEAL MANOMETRY N/A 03/03/2018   Procedure: ESOPHAGEAL  MANOMETRY (EM);  Surgeon: Napoleon Form, MD;  Location: [facility] ENDOSCOPY;  Service: Endoscopy;  Laterality: N/A;   HYSTEROSCOPY     fibroids   LAPAROSCOPIC CHOLECYSTECTOMY  1985   LAPAROSCOPY     fibroids   NISSEN FUNDOPLICATION  1990s   POLYPECTOMY     REVERSE SHOULDER ARTHROPLASTY Left 06/01/2022   Procedure: REVERSE SHOULDER ARTHROPLASTY;  Surgeon: Beverely Low, MD;  Location: [facility] ORS;  Service: Orthopedics;  Laterality: Left;  choice with interscalene block   RIGHT/LEFT HEART CATH AND CORONARY ANGIOGRAPHY N/A 02/20/2018   Procedure: RIGHT/LEFT HEART CATH AND CORONARY ANGIOGRAPHY;  Surgeon: Lyn Records, MD;  Location: [facility] INVASIVE CV LAB;  Service: Cardiovascular;  Laterality: N/A;   TEE WITHOUT CARDIOVERSION N/A 07/08/2018   Procedure: TRANSESOPHAGEAL ECHOCARDIOGRAM (TEE);  Surgeon: Kathleene Hazel, MD;  Location: [facility] OR;  Service: Open Heart Surgery;  Laterality: N/A;   TEE WITHOUT CARDIOVERSION  10/07/2018   TEE WITHOUT CARDIOVERSION N/A 10/07/2018   Procedure: TRANSESOPHAGEAL ECHOCARDIOGRAM (TEE);  Surgeon: Jake Bathe, MD;  Location: [facility] ENDOSCOPY;  Service: Cardiovascular;  Laterality: N/A;   TONSILLECTOMY     age 62   TRANSCATHETER AORTIC VALVE REPLACEMENT, TRANSFEMORAL N/A 07/08/2018   Procedure: TRANSCATHETER AORTIC VALVE REPLACEMENT, TRANSFEMORAL;  Surgeon: Kathleene Hazel, MD;  Location: [facility] OR;  Service: Open Heart Surgery;  Laterality: N/A;    Social History  reports that she has never smoked. She has never been exposed to tobacco smoke. She has never used smokeless tobacco. She reports that she does not drink alcohol and does not use drugs.  No Known Allergies  Family History  Adopted: Yes  Problem Relation Age of Onset   Healthy Son        x 2   Headache Other        Cluster headaches   Heart failure Mother    Colon cancer Neg Hx    Pancreatic cancer Neg Hx    Rectal cancer Neg Hx    Stomach cancer Neg Hx   Reviewed on admission  Prior to  Admission medications   Medication Sig Start Date End Date Taking? Authorizing Provider  amoxicillin (AMOXIL) 500 MG capsule Take 2 capsules by mouth 1 hour before dentist or other procedures and 2 capsules by mouth after visit 10/10/22     apixaban (ELIQUIS) 5 MG TABS tablet Take 1 tablet (5 mg total) by mouth 2 (two) times daily. 01/02/22   Lyn Records, MD  b complex vitamins capsule Take 1 capsule by mouth daily.    [provider]  BD PEN NEEDLE NANO 2ND GEN 32G X 4 MM MISC  11/26/20   [provider]  Continuous Blood Gluc Receiver (FREESTYLE LIBRE READER) DEVI Use as directed 03/14/22     Continuous Glucose Sensor (FREESTYLE LIBRE 3 SENSOR) MISC Place one sensor every 14 days 03/14/22     cyclobenzaprine (  FLEXERIL) 5 MG tablet Take 1 tablet (5 mg total) by mouth every evening. 08/28/22     diltiazem (CARDIZEM CD) 120 MG 24 hr capsule Take 1 capsule (120 mg total) by mouth daily. 01/02/22   Lyn Records, MD  escitalopram (LEXAPRO) 20 MG tablet TAKE ONE TABLET BY MOUTH DAILY 06/15/22   Nafziger, Kandee Keen, NP  Evolocumab (REPATHA SURECLICK) 140 MG/ML SOAJ Inject 1 dose into the skin every 14 (fourteen) days. 03/27/22   Lyn Records, MD  insulin NPH Human (HUMULIN N) 100 UNIT/ML injection Inject 20 units under the skin in the morning and 14 units in evening 02/20/22     Insulin Pen Needle (UNIFINE PENTIPS) 32G X 4 MM MISC Inject 4 times subcutaneously 06/23/21     insulin regular (HUMULIN R) 100 units/mL injection Inject 10 Units total into the skin 3 (three) times daily before meals. 02/20/22     Insulin Syringe-Needle U-100 (ULTICARE INSULIN SYRINGE) 31G X 5/16" 1 ML MISC Inject 4 times daily subcutaneously 01/12/22   Shirline Frees, NP  metFORMIN (GLUCOPHAGE-XR) 500 MG 24 hr tablet Take 1 tablet by mouth 2 times daily 08/29/22     methocarbamol (ROBAXIN) 500 MG tablet Take 1 tablet (500 mg total) by mouth 4 (four) times daily. 09/27/22     Multiple Vitamin (MULITIVITAMIN WITH MINERALS)  TABS Take 1 tablet by mouth daily.    [provider]  nitroGLYCERIN (NITROSTAT) 0.4 MG SL tablet Place 1 tablet (0.4 mg total) under the tongue every 5 (five) minutes as needed for chest pain. 11/14/21   Lyn Records, MD  omeprazole (PRILOSEC) 20 MG capsule Take 1 capsule (20 mg total) by mouth daily. 06/07/22   [name], Willaim Rayas, MD  potassium chloride SA (KLOR-CON M) 20 MEQ tablet Take 1 tablet (20 mEq total) by mouth twice daily for 3 days only, then decrease to taking 1 tablet (20 mEq total) by mouth daily thereafter.  Take in concurrence with torsemide. 06/26/22   Meriam Sprague, MD  primidone (MYSOLINE) 50 MG tablet Take 1 tablet (50 mg total) by mouth every morning. 09/27/22   Dohmeier, Porfirio Mylar, MD  SYNTHROID 137 MCG tablet Take 1 tablet (137 mcg total) by mouth every morning on empty stomach. 08/01/22     torsemide (DEMADEX) 20 MG tablet Take 2 tablets (40 mg total) by mouth daily. 10/08/22   Ronney Asters, NP    Physical Exam: Vitals:   10/16/22 1455 10/16/22 1502 10/16/22 1630 10/16/22 1745  BP: 123/60  (!) 125/58 116/60  Pulse: 68  65 69  Resp: (!) 21  18 19   Temp: 98.2 F (36.8 C) 98.7 F (37.1 C)    TempSrc: Oral Oral    SpO2: 95%  100% 100%  Weight:      Height:        Physical Exam Constitutional:      General: She is not in acute distress.    Appearance: Normal appearance. She is obese.  HENT:     Head: Normocephalic and atraumatic.     Mouth/Throat:     Mouth: Mucous membranes are moist.     Pharynx: Oropharynx is clear.  Eyes:     Extraocular Movements: Extraocular movements intact.     Pupils: Pupils are equal, round, and reactive to light.  Cardiovascular:     Rate and Rhythm: Normal rate and regular rhythm.     Pulses: Normal pulses.     Heart sounds: Murmur heard.  Pulmonary:  Effort: Pulmonary effort is normal. No respiratory distress.     Breath sounds: Normal breath sounds.  Abdominal:     General: Bowel sounds are normal. There  is no distension.     Palpations: Abdomen is soft.     Tenderness: There is no abdominal tenderness.  Musculoskeletal:        General: No swelling or deformity.     Right lower leg: Edema (trace) present.     Left lower leg: Edema (trace) present.  Skin:    General: Skin is warm and dry.  Neurological:     General: No focal deficit present.     Mental Status: Mental status is at baseline.    Labs on Admission: I have personally reviewed following labs and imaging studies  CBC: Recent Labs  Lab 10/16/22 1313  WBC 5.8  NEUTROABS 3.8  HGB 11.2*  HCT 36.3  MCV 66.5*  PLT 169    Basic Metabolic Panel: Recent Labs  Lab 10/16/22 1313  NA 139  K 4.4  CL 104  CO2 17*  GLUCOSE 130*  BUN 17  CREATININE 0.87  CALCIUM 10.5*    GFR: Estimated Creatinine Clearance: 55.7 mL/min (by C-G formula based on SCr of 0.87 mg/dL).  Liver Function Tests: No results for input(s): "AST", "ALT", "ALKPHOS", "BILITOT", "PROT", "ALBUMIN" in the last 168 hours.  Urine analysis:    Component Value Date/Time   COLORURINE YELLOW 04/25/2022 1517   APPEARANCEUR CLEAR 04/25/2022 1517   LABSPEC 1.010 04/25/2022 1517   PHURINE 6.0 04/25/2022 1517   GLUCOSEU NEGATIVE 04/25/2022 1517   HGBUR NEGATIVE 04/25/2022 1517   HGBUR negative 12/13/2008 1035   BILIRUBINUR NEGATIVE 04/25/2022 1517   BILIRUBINUR negative 11/07/2021 1439   KETONESUR NEGATIVE 04/25/2022 1517   PROTEINUR Positive (A) 11/07/2021 1439   PROTEINUR 30 (A) 09/08/2020 0416   UROBILINOGEN 0.2 04/25/2022 1517   NITRITE NEGATIVE 04/25/2022 1517   LEUKOCYTESUR TRACE (A) 04/25/2022 1517    Radiological Exams on Admission: CT Head Wo Contrast  Result Date: 10/16/2022 CLINICAL DATA:  Headache, dizziness, recent fall EXAM: CT HEAD WITHOUT CONTRAST TECHNIQUE: Contiguous axial images were obtained from the base of the skull through the vertex without intravenous contrast. RADIATION DOSE REDUCTION: This exam was performed according to  the departmental dose-optimization program which includes automated exposure control, adjustment of the mA and/or kV according to patient size and/or use of iterative reconstruction technique. COMPARISON:  09/01/2022 FINDINGS: Brain: No acute infarct or hemorrhage. Lateral ventricles and midline structures are unremarkable. No acute extra-axial fluid collections. No mass effect. Vascular: Residual intravascular contrast from preceding CT of the chest. Minimal atherosclerosis. Skull: Normal. Negative for fracture or focal lesion. Sinuses/Orbits: No acute finding. Other: None. IMPRESSION: 1. No acute intracranial process. Electronically Signed   By: Sharlet Salina M.D.   On: 10/16/2022 16:35   CT Angio Chest Aorta w/CM &/OR wo/CM  Result Date: 10/16/2022 CLINICAL DATA:  Chest pain, acute aortic syndrome suspected EXAM: CT ANGIOGRAPHY CHEST WITH CONTRAST TECHNIQUE: Multidetector CT imaging of the chest was performed using the standard protocol during bolus administration of intravenous contrast. Multiplanar CT image reconstructions and MIPs were obtained to evaluate the vascular anatomy. RADIATION DOSE REDUCTION: This exam was performed according to the departmental dose-optimization program which includes automated exposure control, adjustment of the mA and/or kV according to patient size and/or use of iterative reconstruction technique. CONTRAST:  75mL OMNIPAQUE IOHEXOL 350 MG/ML SOLN COMPARISON:  CT chest dated 09/01/2022 FINDINGS: Cardiovascular: Preferential opacification of the thoracic  aorta. No evidence of thoracic aortic aneurysm or dissection. Status post aortic valve replacement. Multichamber cardiomegaly. No pericardial effusion. No central pulmonary emboli. The proximal common carotid artery not well seen due to beam hardening artifact from the left shoulder arthroplasty. Main branch vessels from the aorta are otherwise patent. Coronary artery calcifications and aortic atherosclerosis.  Mediastinum/Nodes: Imaged thyroid gland without nodules meeting criteria for imaging follow-up by size. Small hiatal hernia. No pathologically enlarged axillary, supraclavicular, mediastinal, or hilar lymph nodes. Lungs/Pleura: The central airways are patent. Bilateral lower lobe subsegmental mucous plugging. Bilateral dependent ground-glass densities. No pneumothorax. No pleural effusion. Upper abdomen: Normal. Musculoskeletal: No acute or abnormal lytic or blastic osseous lesions. Left shoulder arthroplasty. Review of the MIP images confirms the above findings. IMPRESSION: 1. No evidence of thoracic aortic aneurysm or dissection. 2. Bilateral dependent ground-glass densities, likely atelectasis. 3. Multichamber cardiomegaly. 4.  Aortic Atherosclerosis (ICD10-I70.0). Electronically Signed   By: Agustin Cree M.D.   On: 10/16/2022 16:19   DG Chest Port 1 View  Result Date: 10/16/2022 CLINICAL DATA:  Chest pain for 2 days EXAM: PORTABLE CHEST 1 VIEW COMPARISON:  04/25/2022 FINDINGS: Cardiomegaly. Aortic valve prosthesis. Unchanged mediastinal contours. Aortic atherosclerosis. No new focal pulmonary opacity. No pleural effusion or pneumothorax. Interval left reverse shoulder arthroplasty. Degenerative changes in the right shoulder. No acute osseous abnormality. IMPRESSION: No acute cardiopulmonary process. Electronically Signed   By: Wiliam Ke M.D.   On: 10/16/2022 14:47    EKG: Not performed in the emergency department  Assessment/Plan Principal Problem:   Intractable pain Active Problems:   Hypothyroidism   (HFpEF) heart failure with preserved ejection fraction (HCC)   Essential hypertension   Gastroesophageal reflux disease   Atherosclerotic heart disease of native coronary artery with other forms of angina pectoris (HCC)   S/P TAVR (transcatheter aortic valve replacement)   Iron deficiency anemia   Paroxysmal atrial fibrillation (HCC)   Anxiety   OSA treated with BiPAP   History of CVA with  residual deficit   Left hemiparesis (HCC)   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HCC)   Type 2 diabetes mellitus with other specified complication, with long-term current use of insulin (HCC)   Intractable pain (chest pain) > Patient presenting with chest pain only transiently improved with fentanyl. > 2 days of worsening sharp pain to the left chest under left breast as per HPI.  No traumatic injury. > Troponin negative x 2.  BNP pending.  Does have gap metabolic acidosis so we will check lactic acid. > Pain responding to fentanyl but returns and patient uncomfortable with the idea of going home on p.o. medications per EDP. > Given unilateral/dermatomal distribution of the left chest radiating to the side this could be developing shingles as well. > If patient remains symptomatic, may need cardiology consult in the morning for reassurance. - Monitor on MedSurg unit with continuous pulse ox - As needed Norco - As needed Robaxin - Lactic acid  CAD > Status post DES. > Presenting with chest pain but troponin negative x 2 in the ED. - Continue home Repatha, Eliquis  Status post TAVR History of severe AS > Recent stable echo earlier this year.  Atrial fibrillation - Continue home diltiazem, Eliquis  Chronic diastolic CHF > Last echo in March of this year showed EF 60 and 65%, indeterminate diastolic function poorly visualized RV function. > Edema is stable, no dyspnea. > BNP ordered and is pending. - Continue home torsemide  Hypertension - Continue home torsemide, diltiazem  OSA - Continue home BiPAP  History of CVA - Continue home Repatha and Eliquis  Anemia > Hemoglobin stable 11.8 - Trend CBC  Hypothyroidism - Continue on Synthroid  GERD - Continue home PPI  Anxiety - Continue home Lexapro  Secondary vascular parkinsonism - Noted  DVT prophylaxis: Eliquis Code Status:   Full Family Communication:  Updated at bedside  Disposition Plan:   Patient is  from:  Home  Anticipated DC to:  Home  Anticipated DC date:  1 to 2 days  Anticipated DC barriers: None  Consults called:  None Admission status:  Observation, MedSurg  Severity of Illness: The appropriate patient status for this patient is OBSERVATION. Observation status is judged to be reasonable and necessary in order to provide the required intensity of service to ensure the patient's safety. The patient's presenting symptoms, physical exam findings, and initial radiographic and laboratory data in the context of their medical condition is felt to place them at decreased risk for further clinical deterioration. Furthermore, it is anticipated that the patient will be medically stable for discharge from the hospital within 2 midnights of admission.    Synetta Fail MD [facility]  How to contact the [facility] Attending or Consulting provider 7A - 7P or covering provider during after hours 7P -7A, for this patient?   Check the care team in [facility] and look for a) attending/consulting [facility] provider listed and b) the [facility] team listed Log into [email or web address] and use [employer]'s universal password to access. If you do not have the password, please contact the hospital operator. Locate the [facility] provider you are looking for under [facility] and page to a number that you can be directly reached. If you still have difficulty reaching the provider, please page the [facility] (Director on Call) for the Hospitalists listed on amion for assistance.  10/16/2022, 6:12 PM

## 2022-10-16 NOTE — ED Notes (Signed)
Report to [place] rn

## 2022-10-17 ENCOUNTER — Ambulatory Visit: Payer: Medicare Other | Admitting: Gastroenterology

## 2022-10-17 DIAGNOSIS — I110 Hypertensive heart disease with heart failure: Secondary | ICD-10-CM | POA: Diagnosis not present

## 2022-10-17 DIAGNOSIS — I25118 Atherosclerotic heart disease of native coronary artery with other forms of angina pectoris: Secondary | ICD-10-CM | POA: Diagnosis not present

## 2022-10-17 DIAGNOSIS — Z952 Presence of prosthetic heart valve: Secondary | ICD-10-CM | POA: Diagnosis not present

## 2022-10-17 DIAGNOSIS — Z794 Long term (current) use of insulin: Secondary | ICD-10-CM | POA: Diagnosis not present

## 2022-10-17 DIAGNOSIS — Z79899 Other long term (current) drug therapy: Secondary | ICD-10-CM | POA: Diagnosis not present

## 2022-10-17 DIAGNOSIS — Z96612 Presence of left artificial shoulder joint: Secondary | ICD-10-CM | POA: Diagnosis not present

## 2022-10-17 DIAGNOSIS — I480 Paroxysmal atrial fibrillation: Secondary | ICD-10-CM | POA: Diagnosis not present

## 2022-10-17 DIAGNOSIS — R079 Chest pain, unspecified: Secondary | ICD-10-CM | POA: Diagnosis not present

## 2022-10-17 DIAGNOSIS — Z7901 Long term (current) use of anticoagulants: Secondary | ICD-10-CM | POA: Diagnosis not present

## 2022-10-17 DIAGNOSIS — E039 Hypothyroidism, unspecified: Secondary | ICD-10-CM | POA: Diagnosis not present

## 2022-10-17 DIAGNOSIS — R52 Pain, unspecified: Secondary | ICD-10-CM | POA: Diagnosis not present

## 2022-10-17 DIAGNOSIS — Z955 Presence of coronary angioplasty implant and graft: Secondary | ICD-10-CM | POA: Diagnosis not present

## 2022-10-17 DIAGNOSIS — G219 Secondary parkinsonism, unspecified: Secondary | ICD-10-CM | POA: Diagnosis not present

## 2022-10-17 LAB — CBC
HCT: 31.6 % — ABNORMAL LOW (ref 36.0–46.0)
Hemoglobin: 9.8 g/dL — ABNORMAL LOW (ref 12.0–15.0)
MCH: 19.6 pg — ABNORMAL LOW (ref 26.0–34.0)
MCHC: 31 g/dL (ref 30.0–36.0)
MCV: 63.2 fL — ABNORMAL LOW (ref 80.0–100.0)
Platelets: 149 10*3/uL — ABNORMAL LOW (ref 150–400)
RBC: 5 MIL/uL (ref 3.87–5.11)
RDW: 15.6 % — ABNORMAL HIGH (ref 11.5–15.5)
WBC: 6.5 10*3/uL (ref 4.0–10.5)
nRBC: 0 % (ref 0.0–0.2)

## 2022-10-17 LAB — COMPREHENSIVE METABOLIC PANEL
ALT: 20 U/L (ref 0–44)
AST: 20 U/L (ref 15–41)
Albumin: 3.4 g/dL — ABNORMAL LOW (ref 3.5–5.0)
Alkaline Phosphatase: 77 U/L (ref 38–126)
Anion gap: 8 (ref 5–15)
BUN: 13 mg/dL (ref 8–23)
CO2: 23 mmol/L (ref 22–32)
Calcium: 9.7 mg/dL (ref 8.9–10.3)
Chloride: 105 mmol/L (ref 98–111)
Creatinine, Ser: 0.8 mg/dL (ref 0.44–1.00)
GFR, Estimated: >60 mL/min (ref >60)
Glucose, Bld: 126 mg/dL — ABNORMAL HIGH (ref 70–99)
Potassium: 3.9 mmol/L (ref 3.5–5.1)
Sodium: 136 mmol/L (ref 135–145)
Total Bilirubin: 0.6 mg/dL (ref 0.3–1.2)
Total Protein: 6 g/dL — ABNORMAL LOW (ref 6.5–8.1)

## 2022-10-17 LAB — GLUCOSE, CAPILLARY
Glucose-Capillary: 146 mg/dL — ABNORMAL HIGH (ref 70–99)
Glucose-Capillary: 154 mg/dL — ABNORMAL HIGH (ref 70–99)
Glucose-Capillary: 166 mg/dL — ABNORMAL HIGH (ref 70–99)

## 2022-10-17 LAB — TROPONIN I (HIGH SENSITIVITY): Troponin I (High Sensitivity): 16 ng/L (ref <18)

## 2022-10-17 MED ORDER — NAPROXEN 250 MG PO TABS
250.0000 mg | ORAL_TABLET | Freq: Two times a day (BID) | ORAL | Status: DC
Start: 2022-10-17 — End: 2022-10-17

## 2022-10-17 MED ORDER — PRIMIDONE 50 MG PO TABS
50.0000 mg | ORAL_TABLET | Freq: Every morning | ORAL | Status: DC
  Administered 2022-10-17 – 2022-10-18 (×2): 50 mg via ORAL
  Filled 2022-10-17 (×2): qty 1

## 2022-10-17 MED ORDER — LIDOCAINE 5 % EX PTCH
1.0000 | MEDICATED_PATCH | CUTANEOUS | Status: DC
  Administered 2022-10-17: 1 via TRANSDERMAL
  Filled 2022-10-17: qty 1

## 2022-10-17 MED ORDER — METHOCARBAMOL 500 MG PO TABS
500.0000 mg | ORAL_TABLET | Freq: Four times a day (QID) | ORAL | Status: DC
  Administered 2022-10-17 – 2022-10-18 (×5): 500 mg via ORAL
  Filled 2022-10-17 (×5): qty 1

## 2022-10-17 MED ORDER — BUTALBITAL-APAP-CAFFEINE 50-325-40 MG PO TABS: 1.0000 | ORAL_TABLET | ORAL | Status: DC | PRN

## 2022-10-17 MED ORDER — INSULIN ASPART PROT & ASPART (70-30 MIX) 100 UNIT/ML ~~LOC~~ SUSP
14.0000 [IU] | Freq: Two times a day (BID) | SUBCUTANEOUS | Status: DC
  Administered 2022-10-17 – 2022-10-18 (×2): 14 [IU] via SUBCUTANEOUS
  Filled 2022-10-17: qty 10

## 2022-10-17 MED ORDER — INSULIN ASPART 100 UNIT/ML IJ SOLN: 0.0000 [IU] | Freq: Every day | INTRAMUSCULAR | Status: DC

## 2022-10-17 MED ORDER — GABAPENTIN 100 MG PO CAPS
100.0000 mg | ORAL_CAPSULE | Freq: Three times a day (TID) | ORAL | Status: DC
  Administered 2022-10-17 – 2022-10-18 (×3): 100 mg via ORAL
  Filled 2022-10-17 (×3): qty 1

## 2022-10-17 MED ORDER — INSULIN ASPART 100 UNIT/ML IJ SOLN
4.0000 [IU] | Freq: Three times a day (TID) | INTRAMUSCULAR | Status: DC
  Administered 2022-10-17 – 2022-10-18 (×4): 4 [IU] via SUBCUTANEOUS

## 2022-10-17 NOTE — Discharge Instructions (Signed)

## 2022-10-17 NOTE — Progress Notes (Deleted)
HPI :  77 year old female with a past medical history of  hypertension, coronary artery disease s/p DES 2019, chronic diastolic CHF, s/p TAVR, paroxysmal atrial fibrillation, microcytic anemia, DM II, CVA, chronic imbalance since CVA with recent fall seen in the ED 04/05/2021, Graves' disease/hypothyroidism, Barrett's esophagus, GERD, past Nissen fundoplication surgery and NASH cirrhosis. She is followed by Dr. Adela Lank.  She presents today accompanied by her husband to further discuss her EGD and colonoscopy results.  She underwent a surveillance EGD 03/07/2022 which showed a normal esophagus without evidence of Barrett's esophagus and no evidence of esophageal or gastric varices.  Prior Nissan fundoplication was intact.  A repeat EGD in a few years was considered.  The colonoscopy identified 6 tubular adenomatous/hyperplastic polyps removed from the colon, diverticulosis throughout the colon and internal hemorrhoids.  No further colon polyp surveillance colonoscopies were recommended due to age.  She underwent a surveillance RUQ sonogram 04/09/2022 which was consistent with cirrhosis without evidence of a hepatoma.  AFP 2.4 on 04/06/2022.  She denies having any dysphagia or heartburn.  She is taking Omeprazole 20 mg once daily.  No upper or lower abdominal pain.  She is passing normal formed brown bowel movement most days.  No recent diarrhea issues.  No rectal bleeding or black stools.  No chest pain or palpitations.  No issues with confusion.  She is scheduled to for left reverse shoulder arthroplasty on 06/01/2022.       Past Medical History:  Diagnosis Date   Anxiety    Arthritis    "back" (04/22/2018)   Atrial fibrillation with RVR (HCC) 10/21/2018   Back pain    Bacteremia due to Streptococcus Salivarius 10/07/2018   Blood transfusion without reported diagnosis    CAD (coronary artery disease)    a. 03/2018 s/p PCI/DES to the RCA (3.0x15 Onyx DES).   Carotid artery stenosis    Mild    Cerebellar stroke, acute (HCC) 10/21/2018   Cerebrovascular accident (CVA) due to embolism of right posterior cerebral artery (HCC) 08/03/2020   Chest pain    CHF (congestive heart failure) (HCC)    Chronic lower back pain    Cirrhosis (HCC)    Colon polyps    Diverticulitis    Diverticulosis    Esophageal thickening    seen on pre TAVR CT scan, also questionable cirrhosis. MRI recommended. Will refer to GI   GERD (gastroesophageal reflux disease)    Grave's disease    H/O total shoulder replacement, left 06/01/2022   Heart murmur    History of cardioembolic stroke 03/29/2020   History of colonic polyps 05/22/2017   History of hiatal hernia    Hyperlipidemia    Hypertension    Hypothyroidism    IBS (irritable bowel syndrome)    Osteopenia    Parkinson's syndrome 04/23/2020   Pulmonary nodules    seen on pre TAVR CT. likley benign. no follow up recommended if pt low risk.   S/P TAVR (transcatheter aortic valve replacement)    Shortness of breath on exertion    Stroke (HCC) 2021   x2   Thalassemia minor    Thyroid disease    Type II diabetes mellitus (HCC)      Past Surgical History:  Procedure Laterality Date   24 HOUR PH STUDY N/A 03/03/2018   Procedure: 24 HOUR PH STUDY;  Surgeon: Napoleon Form, MD;  Location: [facility] ENDOSCOPY;  Service: Endoscopy;  Laterality: N/A;   COLONOSCOPY  2023   COLONOSCOPY W/  BIOPSIES AND POLYPECTOMY     CORONARY ANGIOGRAPHY Right 04/21/2018   Procedure: CORONARY ANGIOGRAPHY (CATH LAB);  Surgeon: Lyn Records, MD;  Location: [facility] INVASIVE CV LAB;  Service: Cardiovascular;  Laterality: Right;   CORONARY STENT INTERVENTION N/A 04/22/2018   Procedure: CORONARY STENT INTERVENTION;  Surgeon: Lyn Records, MD;  Location: [facility] INVASIVE CV LAB;  Service: Cardiovascular;  Laterality: N/A;   DILATION AND CURETTAGE OF UTERUS     ESOPHAGEAL MANOMETRY N/A 03/03/2018   Procedure: ESOPHAGEAL MANOMETRY (EM);  Surgeon: Napoleon Form, MD;  Location:  [facility] ENDOSCOPY;  Service: Endoscopy;  Laterality: N/A;   HYSTEROSCOPY     fibroids   LAPAROSCOPIC CHOLECYSTECTOMY  1985   LAPAROSCOPY     fibroids   NISSEN FUNDOPLICATION  1990s   POLYPECTOMY     REVERSE SHOULDER ARTHROPLASTY Left 06/01/2022   Procedure: REVERSE SHOULDER ARTHROPLASTY;  Surgeon: Beverely Low, MD;  Location: [facility] ORS;  Service: Orthopedics;  Laterality: Left;  choice with interscalene block   RIGHT/LEFT HEART CATH AND CORONARY ANGIOGRAPHY N/A 02/20/2018   Procedure: RIGHT/LEFT HEART CATH AND CORONARY ANGIOGRAPHY;  Surgeon: Lyn Records, MD;  Location: [facility] INVASIVE CV LAB;  Service: Cardiovascular;  Laterality: N/A;   TEE WITHOUT CARDIOVERSION N/A 07/08/2018   Procedure: TRANSESOPHAGEAL ECHOCARDIOGRAM (TEE);  Surgeon: Kathleene Hazel, MD;  [facility];  Service: Open Heart Surgery;  Laterality: N/A;   TEE WITHOUT CARDIOVERSION  10/07/2018   TEE WITHOUT CARDIOVERSION N/A 10/07/2018   Procedure: TRANSESOPHAGEAL ECHOCARDIOGRAM (TEE);  Surgeon: Jake Bathe, MD;  Location: [facility] ENDOSCOPY;  Service: Cardiovascular;  Laterality: N/A;   TONSILLECTOMY     age 38   TRANSCATHETER AORTIC VALVE REPLACEMENT, TRANSFEMORAL N/A 07/08/2018   Procedure: TRANSCATHETER AORTIC VALVE REPLACEMENT, TRANSFEMORAL;  Surgeon: Kathleene Hazel, MD;  Location: [facility] OR;  Service: Open Heart Surgery;  Laterality: N/A;   Family History  Adopted: Yes  Problem Relation Age of Onset   Healthy Son        x 2   Headache Other        Cluster headaches   Heart failure Mother    Colon cancer Neg Hx    Pancreatic cancer Neg Hx    Rectal cancer Neg Hx    Stomach cancer Neg Hx    Social History   Tobacco Use   Smoking status: Never    Passive exposure: Never   Smokeless tobacco: Never  Vaping Use   Vaping Use: Never used  Substance Use Topics   Alcohol use: No    Alcohol/week: 0.0 standard drinks of alcohol   Drug use: Never   No current facility-administered medications for this  visit.   No current outpatient medications on file.   Facility-Administered Medications Ordered in Other Visits  Medication Dose Route Frequency Provider Last Rate Last Admin   acetaminophen (TYLENOL) tablet 650 mg  650 mg Oral Q6H PRN Synetta Fail, MD       Or   acetaminophen (TYLENOL) suppository 650 mg  650 mg Rectal Q6H PRN Synetta Fail, MD       apixaban Everlene Balls) tablet 5 mg  5 mg Oral BID Synetta Fail, MD   5 mg at 10/16/22 2106   diltiazem (CARDIZEM CD) 24 hr capsule 120 mg  120 mg Oral Daily Synetta Fail, MD       escitalopram (LEXAPRO) tablet 20 mg  20 mg Oral Daily Synetta Fail, MD       fentaNYL (SUBLIMAZE) injection  25 mcg  25 mcg Intravenous Q3H PRN Synetta Fail, MD   25 mcg at 10/17/22 0024   HYDROcodone-acetaminophen (NORCO) 7.5-325 MG per tablet 1 tablet  1 tablet Oral Q6H PRN Synetta Fail, MD   1 tablet at 10/17/22 0510   levothyroxine (SYNTHROID) tablet 137 mcg  137 mcg Oral Q0600 Synetta Fail, MD   137 mcg at 10/17/22 0510   pantoprazole (PROTONIX) EC tablet 40 mg  40 mg Oral Daily Synetta Fail, MD       polyethylene glycol (MIRALAX / GLYCOLAX) packet 17 g  17 g Oral Daily PRN Synetta Fail, MD       sodium chloride flush (NS) 0.9 % injection 3 mL  3 mL Intravenous Q12H Synetta Fail, MD   3 mL at 10/16/22 2102   torsemide (DEMADEX) tablet 40 mg  40 mg Oral Daily Synetta Fail, MD       No Known Allergies   Review of Systems: All systems reviewed and negative except where noted in HPI.    CT Head Wo Contrast  Result Date: 10/16/2022 CLINICAL DATA:  Headache, dizziness, recent fall EXAM: CT HEAD WITHOUT CONTRAST TECHNIQUE: Contiguous axial images were obtained from the base of the skull through the vertex without intravenous contrast. RADIATION DOSE REDUCTION: This exam was performed according to the departmental dose-optimization program which includes automated exposure control,  adjustment of the mA and/or kV according to patient size and/or use of iterative reconstruction technique. COMPARISON:  09/01/2022 FINDINGS: Brain: No acute infarct or hemorrhage. Lateral ventricles and midline structures are unremarkable. No acute extra-axial fluid collections. No mass effect. Vascular: Residual intravascular contrast from preceding CT of the chest. Minimal atherosclerosis. Skull: Normal. Negative for fracture or focal lesion. Sinuses/Orbits: No acute finding. Other: None. IMPRESSION: 1. No acute intracranial process. Electronically Signed   By: Sharlet Salina M.D.   On: 10/16/2022 16:35   CT Angio Chest Aorta w/CM &/OR wo/CM  Result Date: 10/16/2022 CLINICAL DATA:  Chest pain, acute aortic syndrome suspected EXAM: CT ANGIOGRAPHY CHEST WITH CONTRAST TECHNIQUE: Multidetector CT imaging of the chest was performed using the standard protocol during bolus administration of intravenous contrast. Multiplanar CT image reconstructions and MIPs were obtained to evaluate the vascular anatomy. RADIATION DOSE REDUCTION: This exam was performed according to the departmental dose-optimization program which includes automated exposure control, adjustment of the mA and/or kV according to patient size and/or use of iterative reconstruction technique. CONTRAST:  75mL OMNIPAQUE IOHEXOL 350 MG/ML SOLN COMPARISON:  CT chest dated 09/01/2022 FINDINGS: Cardiovascular: Preferential opacification of the thoracic aorta. No evidence of thoracic aortic aneurysm or dissection. Status post aortic valve replacement. Multichamber cardiomegaly. No pericardial effusion. No central pulmonary emboli. The proximal common carotid artery not well seen due to beam hardening artifact from the left shoulder arthroplasty. Main branch vessels from the aorta are otherwise patent. Coronary artery calcifications and aortic atherosclerosis. Mediastinum/Nodes: Imaged thyroid gland without nodules meeting criteria for imaging follow-up by  size. Small hiatal hernia. No pathologically enlarged axillary, supraclavicular, mediastinal, or hilar lymph nodes. Lungs/Pleura: The central airways are patent. Bilateral lower lobe subsegmental mucous plugging. Bilateral dependent ground-glass densities. No pneumothorax. No pleural effusion. Upper abdomen: Normal. Musculoskeletal: No acute or abnormal lytic or blastic osseous lesions. Left shoulder arthroplasty. Review of the MIP images confirms the above findings. IMPRESSION: 1. No evidence of thoracic aortic aneurysm or dissection. 2. Bilateral dependent ground-glass densities, likely atelectasis. 3. Multichamber cardiomegaly. 4.  Aortic Atherosclerosis (ICD10-I70.0). Electronically Signed  By: Agustin Cree M.D.   On: 10/16/2022 16:19   DG Chest Port 1 View  Result Date: 10/16/2022 CLINICAL DATA:  Chest pain for 2 days EXAM: PORTABLE CHEST 1 VIEW COMPARISON:  04/25/2022 FINDINGS: Cardiomegaly. Aortic valve prosthesis. Unchanged mediastinal contours. Aortic atherosclerosis. No new focal pulmonary opacity. No pleural effusion or pneumothorax. Interval left reverse shoulder arthroplasty. Degenerative changes in the right shoulder. No acute osseous abnormality. IMPRESSION: No acute cardiopulmonary process. Electronically Signed   By: Wiliam Ke M.D.   On: 10/16/2022 14:47    Physical Exam: There were no vitals taken for this visit. Constitutional: Pleasant,well-developed, ***female in no acute distress. HEENT: Normocephalic and atraumatic. Conjunctivae are normal. No scleral icterus. Neck supple.  Cardiovascular: Normal rate, regular rhythm.  Pulmonary/chest: Effort normal and breath sounds normal. No wheezing, rales or rhonchi. Abdominal: Soft, nondistended, nontender. Bowel sounds active throughout. There are no masses palpable. No hepatomegaly. Extremities: no edema Lymphadenopathy: No cervical adenopathy noted. Neurological: Alert and oriented to person place and time. Skin: Skin is warm and  dry. No rashes noted. Psychiatric: Normal mood and affect. Behavior is normal.   ASSESSMENT: 77 y.o. female here for assessment of the following  No diagnosis found.  PLAN:   Shirline Frees, NP

## 2022-10-17 NOTE — Progress Notes (Signed)
[facility] Progress Note Patient: Breanna Webster GUR:[id number] DOB: 24-Aug-1945 DOA: 10/16/2022  DOS: the patient was seen and examined on 10/17/2022  Brief hospital course: diabetes, OSA on BiPAP, secondary vascular parkinsonism, CVA with left-sided hemiparesis, status post TAVR, atrial fibrillation, anemia, hypothyroidism, CAD status post DES, GERD, hypertension, anxiety, CHF presenting with chest pain.  Troponin x 3 are negative.  ACS ruled out. CT PE was performed.  PE ruled out.  Pneumonia ruled out.  Rib fractures ruled out.  No pneumothorax. Assessment and Plan: Intractable pain (chest pain) Patient presenting with chest pain only transiently improved with fentanyl. 2 days of worsening sharp pain to the left chest under left breast as per HPI.  No traumatic injury.  Reported pins-and-needles. Troponin x 3 negative.  BNP elevated.  Lactic acid WNL. Patient is fixated that this is probably shingles. While currently unable to deny the possibility given the lack of any rash presentation as well as patient's symptoms of also generalized pain with headache and her back pain not following the dermatome as well as with itching not following a dermatomal distribution no indication to initiate Valtrex therapy. Pain control with Robaxin, gabapentin, will add NSAIDs.   CAD Status post DES. Presenting with chest pain but troponin negative x 3 Continue home Repatha, Eliquis   Status post TAVR History of severe AS Recent stable echo earlier this year.   Paroxysmal atrial fibrillation Continue home diltiazem, Eliquis   Chronic diastolic CHF Last echo in March of this year showed EF 60 and 65%, indeterminate diastolic function poorly visualized RV function. Edema is stable, no dyspnea. Continue home torsemide   Hypertension - Continue home torsemide, diltiazem   OSA - Continue home BiPAP   History of CVA - Continue home Repatha and Eliquis   Anemia Hemoglobin stable    Hypothyroidism - Continue on Synthroid   GERD - Continue home PPI   Anxiety - Continue home Lexapro   Secondary vascular parkinsonism - Noted   Obesity Body mass index is 30.45 kg/m.  Placing the pt at higher risk of poor outcomes.   Subjective: Continues to report intermittent pain.  No nausea or vomiting.  Severely anxious about the diagnosis.  Physical Exam: General: in moderate distress, No Rash Cardiovascular: S1 and S2 Present, No Murmur Respiratory: Good respiratory effort, Bilateral Air entry present. No Crackles, No wheezes Abdomen: Bowel Sound present, No tenderness Extremities: Trace edema Neuro: Alert and oriented x3, no new focal deficit  Data Reviewed: I have Reviewed nursing notes, Vitals, and Lab results. Since last encounter, pertinent lab results CBC and BMP   . I have ordered test including CBC and BMP  .   Disposition: Status is: Observation The patient remains OBS appropriate and will d/c before 2 midnights.  apixaban (ELIQUIS) tablet 5 mg   Family Communication: No one at bedside Level of care: Telemetry Medical   Vitals:   10/16/22 2145 10/17/22 0510 10/17/22 0746 10/17/22 1623  BP: 131/75 (!) 147/71 (!) 135/58 (!) 109/57  Pulse: 70 72 73 70  Resp: 18 19 18 18   Temp: 97.9 F (36.6 C)   98 F (36.7 C)  TempSrc:      SpO2: 99% 98% 97% 92%  Weight: 80.5 kg     Height: 5\' 4"  (1.626 m)        Author: Lynden Oxford, MD 10/17/2022 6:06 PM  Please look on [email or web address] to find out who is on call.

## 2022-10-17 NOTE — Hospital Course (Addendum)
Brief hospital course: diabetes, OSA on BiPAP, secondary vascular parkinsonism, CVA with left-sided hemiparesis, status post TAVR, atrial fibrillation, anemia, hypothyroidism, CAD status post DES, GERD, hypertension, anxiety, CHF presenting with chest pain.  Troponin x 3 are negative.  ACS ruled out. CT PE was performed.  PE ruled out.  Pneumonia ruled out.  Rib fractures ruled out.  No pneumothorax. Assessment and Plan: Intractable pain (chest pain) Patient presenting with chest pain only transiently improved with fentanyl. 2 days of worsening sharp pain to the left chest under left breast as per HPI.  No traumatic injury.  Reported pins-and-needles. Troponin x 3 negative.  BNP elevated.  Lactic acid WNL. Patient is fixated that this is probably shingles. While currently unable to deny the possibility given the lack of any rash presentation as well as patient's symptoms of also generalized pain with headache and her back pain not following the dermatome as well as with itching not following a dermatomal distribution no indication to initiate Valtrex therapy. Pain control with Robaxin, gabapentin, will add NSAIDs.   CAD Status post DES. Presenting with chest pain but troponin negative x 3 Continue home Repatha, Eliquis   Status post TAVR History of severe AS Recent stable echo earlier this year.   Paroxysmal atrial fibrillation Continue home diltiazem, Eliquis   Chronic diastolic CHF Last echo in March of this year showed EF 60 and 65%, indeterminate diastolic function poorly visualized RV function. Edema is stable, no dyspnea. Continue home torsemide   Hypertension - Continue home torsemide, diltiazem   OSA - Continue home BiPAP   History of CVA - Continue home Repatha and Eliquis   Anemia Hemoglobin stable   Hypothyroidism - Continue on Synthroid   GERD - Continue home PPI   Anxiety - Continue home Lexapro   Secondary vascular parkinsonism - Noted    Obesity Body mass index is 30.45 kg/m.  Placing the pt at higher risk of poor outcomes.

## 2022-10-17 NOTE — Care Management Obs Status (Signed)
MEDICARE OBSERVATION STATUS NOTIFICATION   Patient Details  Name: Breanna Webster MRN: [id number] Date of Birth: 08-23-1945   Medicare Observation Status Notification Given:  Yes    Tom-Johnson, Hershal Coria, RN 10/17/2022, 1:43 PM

## 2022-10-18 ENCOUNTER — Other Ambulatory Visit (HOSPITAL_BASED_OUTPATIENT_CLINIC_OR_DEPARTMENT_OTHER): Payer: Self-pay

## 2022-10-18 ENCOUNTER — Encounter: Payer: Self-pay | Admitting: Hematology

## 2022-10-18 DIAGNOSIS — R52 Pain, unspecified: Secondary | ICD-10-CM | POA: Diagnosis not present

## 2022-10-18 DIAGNOSIS — R079 Chest pain, unspecified: Secondary | ICD-10-CM | POA: Diagnosis not present

## 2022-10-18 LAB — GLUCOSE, CAPILLARY
Glucose-Capillary: 129 mg/dL — ABNORMAL HIGH (ref 70–99)
Glucose-Capillary: 211 mg/dL — ABNORMAL HIGH (ref 70–99)
Glucose-Capillary: 200 mg/dL — ABNORMAL HIGH (ref 70–99)

## 2022-10-18 MED ORDER — CYCLOBENZAPRINE HCL 5 MG PO TABS
5.0000 mg | ORAL_TABLET | Freq: Three times a day (TID) | ORAL | 0 refills | Status: DC | PRN
  Filled 2022-10-18: qty 21, 7d supply, fill #0

## 2022-10-18 MED ORDER — GABAPENTIN 100 MG PO CAPS
100.0000 mg | ORAL_CAPSULE | Freq: Three times a day (TID) | ORAL | 0 refills | Status: DC
  Filled 2022-10-18: qty 90, 30d supply, fill #0

## 2022-10-18 MED ORDER — BUTALBITAL-APAP-CAFFEINE 50-325-40 MG PO TABS
1.0000 | ORAL_TABLET | Freq: Four times a day (QID) | ORAL | 0 refills | Status: DC | PRN
  Filled 2022-10-18: qty 20, 5d supply, fill #0

## 2022-10-18 MED ORDER — POTASSIUM CHLORIDE CRYS ER 20 MEQ PO TBCR: 20.0000 meq | EXTENDED_RELEASE_TABLET | Freq: Every day | ORAL | 0 refills | Status: DC

## 2022-10-18 MED ORDER — HYDROCODONE-ACETAMINOPHEN 5-325 MG PO TABS
1.0000 | ORAL_TABLET | ORAL | 0 refills | Status: DC | PRN
  Filled 2022-10-18: qty 30, 5d supply, fill #0

## 2022-10-18 NOTE — Evaluation (Signed)
Physical Therapy Evaluation Patient Details Name: RIELEY KHALSA MRN: [id number] DOB: [birth year] Today's Date: 10/18/2022  History of Present Illness  77 yo female presents to [facility] on 6/25 with chest pain, ShOB. CXR, CT PE negative. PMH includes recent fall with L clavicular fx x4 weeks ago, s/p L reverse TSA 06/01/22, CVA with GERD, Graves' disease, hypertension, CAD s/p stent placement, diabetes, TAVR.  Clinical Impression   Pt presents with generalized weakness, impaired balance with history of falls, impaired activity tolerance, and reports of dizziness but VSS (received oxy this am and reports this makes her dizzy). Pt to benefit from acute PT to address deficits. Pt ambulated short distance with use of cane and steadying assist from PT, states she wants to know "what's wrong with me" because she has not received a diagnosis for why she feels "pins and needles on my breast and hip, itchy all over". PT explained from a functional standpoint pt is likely close to baseline, pt was about to start OPPT prior to admission and this plan remains appropriate. PT to progress mobility as tolerated, and will continue to follow acutely.         Recommendations for follow up therapy are one component of a multi-disciplinary discharge planning process, led by the attending physician.  Recommendations may be updated based on patient status, additional functional criteria and insurance authorization.  Follow Up Recommendations       Assistance Recommended at Discharge Intermittent Supervision/Assistance  Patient can return home with the following  A little help with walking and/or transfers;A little help with bathing/dressing/bathroom    Equipment Recommendations None recommended by PT  Recommendations for Other Services       Functional Status Assessment Patient has had a recent decline in their functional status and demonstrates the ability to make significant improvements in function in a reasonable  and predictable amount of time.     Precautions / Restrictions Precautions Precautions: Fall Restrictions Weight Bearing Restrictions: No      Mobility  Bed Mobility Overal bed mobility: Needs Assistance Bed Mobility: Supine to Sit, Sit to Supine     Supine to sit: Min assist, HOB elevated Sit to supine: Min assist, HOB elevated   General bed mobility comments: light trunk rise assist    Transfers Overall transfer level: Needs assistance Equipment used: Straight cane Transfers: Sit to/from Stand Sit to Stand: Min assist           General transfer comment: light rise assist, pt using cane once standing. stand x2, from EOB and BSC.    Ambulation/Gait Ambulation/Gait assistance: Min assist Gait Distance (Feet): 40 Feet Assistive device: Straight cane Gait Pattern/deviations: Step-through pattern, Decreased stride length, Trunk flexed, Antalgic Gait velocity: decr     General Gait Details: HHA for steadying, use of cane in R hand. Pt reporting pain during gait due to "bone on bone" arthritis  Stairs            Wheelchair Mobility    Modified Rankin (Stroke Patients Only)       Balance Overall balance assessment: Needs assistance, History of Falls Sitting-balance support: No upper extremity supported, Feet supported Sitting balance-Leahy Scale: Fair     Standing balance support: No upper extremity supported, During functional activity, Reliant on assistive device for balance Standing balance-Leahy Scale: Poor                               Pertinent Vitals/Pain Pain  Assessment Pain Assessment: 0-10 Pain Score: 6  Pain Location: L base of skull Pain Descriptors / Indicators: Sore, Discomfort Pain Intervention(s): Limited activity within patient's tolerance, Monitored during session, Repositioned    Home Living Family/patient expects to be discharged to:: Private residence Living Arrangements: Spouse/significant other Available Help  at Discharge: Family;Available 24 hours/day Type of Home: House Home Access: Level entry       Home Layout: One level Home Equipment: Cane - single Librarian, academic (2 wheels);Shower seat      Prior Function Prior Level of Function : Independent/Modified Independent             Mobility Comments: used cane for ambulation, and uses a gait belt ADLs Comments: pt reports husband helps her with "everything"     Hand Dominance   Dominant Hand: Right    Extremity/Trunk Assessment   Upper Extremity Assessment Upper Extremity Assessment: Defer to OT evaluation    Lower Extremity Assessment Lower Extremity Assessment: Generalized weakness    Cervical / Trunk Assessment Cervical / Trunk Assessment: Kyphotic  Communication   Communication: No difficulties  Cognition Arousal/Alertness: Awake/alert Behavior During Therapy: Anxious Overall Cognitive Status: Within Functional Limits for tasks assessed                                 General Comments: pt perseverative on not knowing what her medical diagnosis is        General Comments General comments (skin integrity, edema, etc.): pt reporting dizziness throughout session which pt states is due to receiving oxy this am, BP 108/63 HR 66 bpm SPO2 98% on RA    Exercises     Assessment/Plan    PT Assessment Patient needs continued PT services  PT Problem List Decreased strength;Decreased mobility;Decreased safety awareness;Decreased activity tolerance;Decreased balance;Decreased knowledge of use of DME;Pain       PT Treatment Interventions DME instruction;Therapeutic activities;Gait training;Therapeutic exercise;Patient/family education;Balance training;Functional mobility training;Neuromuscular re-education    PT Goals (Current goals can be found in the Care Plan section)  Acute Rehab PT Goals PT Goal Formulation: With patient Time For Goal Achievement: 11/01/22 Potential to Achieve Goals: Good     Frequency Min 3X/week     Co-evaluation               AM-PAC PT "6 Clicks" Mobility  Outcome Measure Help needed turning from your back to your side while in a flat bed without using bedrails?: A Little Help needed moving from lying on your back to sitting on the side of a flat bed without using bedrails?: A Little Help needed moving to and from a bed to a chair (including a wheelchair)?: A Little Help needed standing up from a chair using your arms (e.g., wheelchair or bedside chair)?: A Little Help needed to walk in hospital room?: A Little Help needed climbing 3-5 steps with a railing? : A Little 6 Click Score: 18    End of Session Equipment Utilized During Treatment: Gait belt Activity Tolerance: Patient tolerated treatment well;Patient limited by fatigue Patient left: in bed;with call bell/phone within reach;with bed alarm set Nurse Communication: Mobility status PT Visit Diagnosis: Other abnormalities of gait and mobility (R26.89);Muscle weakness (generalized) (M62.81)    Time: 1610-9604 PT Time Calculation (min) (ACUTE ONLY): 39 min   Charges:   PT Evaluation $PT Eval Low Complexity: 1 Low PT Treatments $Therapeutic Activity: 23-37 mins        Latori Beggs S,  PT DPT Acute Rehabilitation Services Secure Chat Preferred  Office [phone]   Kalianna Verbeke Sheliah Plane 10/18/2022, 10:50 AM

## 2022-10-18 NOTE — Discharge Planning (Signed)
Patient is resting in bed with normal vitals and respirations. No distress noted or observed. Husband is at bedside. All morning meds have been given. Pain has been addressed and managed. Patient is being prepared for discharge. Education has been provided to patient and husband. IV has been removed. Plan of care is ongoing.   Lawana Pai, RN

## 2022-10-18 NOTE — Progress Notes (Signed)
   10/17/22 2012  BiPAP/CPAP/SIPAP  BiPAP/CPAP/SIPAP Pt Type Adult  Reason BIPAP/CPAP not in use Non-compliant (Patient refused bipap/cpap. Does not wear at home.)

## 2022-10-19 ENCOUNTER — Encounter: Payer: Self-pay | Admitting: Hematology

## 2022-10-19 NOTE — Discharge Summary (Signed)
Physician Discharge Summary   Patient: Breanna Webster MRN: [id number] DOB: Feb 22, 1946  Admit date:     10/16/2022  Discharge date: 10/18/2022  Discharge Physician: Lynden Oxford  PCP: Ronney Asters, NP  Recommendations at discharge: Follow-up with PCP in 1 week. Follow-up with neurology.   Follow-up Information     Molli Hazard Thomasene Ripple, NP. Schedule an appointment as soon as possible for a visit in 1 week(s).   Specialty: Cardiology Contact information: [address] [place] [address] [phone]         Dohmeier, Porfirio Mylar, MD. Go on 10/30/2022.   Specialty: Neurology Contact information: [address] [place] [address] [phone]               Discharge Diagnoses: Principal Problem:   Intractable pain Active Problems:   Hypothyroidism   (HFpEF) heart failure with preserved ejection fraction (HCC)   Essential hypertension   Gastroesophageal reflux disease   Atherosclerotic heart disease of native coronary artery with other forms of angina pectoris (HCC)   S/P TAVR (transcatheter aortic valve replacement)   Iron deficiency anemia   Paroxysmal atrial fibrillation (HCC)   Anxiety   OSA treated with BiPAP   History of CVA with residual deficit   Left hemiparesis (HCC)   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HCC)   Type 2 diabetes mellitus with other specified complication, with long-term current use of insulin (HCC)  Brief hospital course: diabetes, OSA on BiPAP, secondary vascular parkinsonism, CVA with left-sided hemiparesis, status post TAVR, atrial fibrillation, anemia, hypothyroidism, CAD status post DES, GERD, hypertension, anxiety, CHF presenting with chest pain.  Troponin x 3 are negative.  ACS ruled out. CT PE was performed.  PE ruled out.  Pneumonia ruled out.  Rib fractures ruled out.  No pneumothorax. Assessment and Plan: Intractable pain (chest pain) Patient presenting with chest pain only transiently improved with  fentanyl. 2 days of worsening sharp pain to the left chest under left breast as per HPI.  No traumatic injury.  Reported pins-and-needles. Troponin x 3 negative.  BNP elevated.  Lactic acid WNL. Patient is fixated that this is probably shingles. While currently unable to deny the possibility given the lack of any rash presentation as well as patient's symptoms of also generalized pain with headache and her back pain not following the dermatome as well as with itching not following a dermatomal distribution, no indication to initiate Valtrex therapy. Pain control with Robaxin, gabapentin.   CAD Status post DES. Presenting with chest pain but troponin negative x 3 Continue home Repatha, Eliquis   Status post TAVR History of severe AS Recent stable echo earlier this year.   Paroxysmal atrial fibrillation Continue home diltiazem, Eliquis   Chronic diastolic CHF Last echo in March of this year showed EF 60 and 65%, indeterminate diastolic function poorly visualized RV function. Edema is stable, no dyspnea. Continue home torsemide   Hypertension - Continue home torsemide, diltiazem   OSA - Continue home BiPAP   History of CVA - Continue home Repatha and Eliquis   Anemia Hemoglobin stable   Hypothyroidism - Continue on Synthroid   GERD - Continue home PPI   Anxiety - Continue home Lexapro   Secondary vascular parkinsonism - Noted   Obesity Body mass index is 31.02 kg/m.  Placing the pt at higher risk of poor outcomes.  Consultants:  none  Procedures performed:  none  DISCHARGE MEDICATION: Allergies as of 10/18/2022   No Known Allergies  Medication List     STOP taking these medications    methocarbamol 500 MG tablet Commonly known as: ROBAXIN       TAKE these medications    amoxicillin 500 MG capsule Commonly known as: AMOXIL Take 2 capsules by mouth 1 hour before dentist or other procedures and 2 capsules by mouth after visit   b complex  vitamins capsule Take 1 capsule by mouth daily.   BD Pen Needle Nano 2nd Gen 32G X 4 MM Misc Generic drug: Insulin Pen Needle   Unifine Pentips 32G X 4 MM Misc Generic drug: Insulin Pen Needle Inject 4 times subcutaneously   butalbital-acetaminophen-caffeine 50-325-40 MG tablet Commonly known as: FIORICET Take 1 tablet by mouth every 6 (six) hours as needed for headache.   Cartia XT 120 MG 24 hr capsule Generic drug: diltiazem Take 1 capsule (120 mg total) by mouth daily.   cyclobenzaprine 5 MG tablet Commonly known as: FLEXERIL Take 1 tablet (5 mg total) by mouth 3 (three) times daily as needed for muscle spasms. What changed:  when to take this reasons to take this   Eliquis 5 MG Tabs tablet Generic drug: apixaban Take 1 tablet (5 mg total) by mouth 2 (two) times daily.   escitalopram 20 MG tablet Commonly known as: LEXAPRO TAKE ONE TABLET BY MOUTH DAILY What changed:  how much to take how to take this when to take this   FreeStyle Libre 3 Sensor Misc Place one sensor every 14 days   FreeStyle [employer] Use as directed   gabapentin 100 MG capsule Commonly known as: NEURONTIN Take 1 capsule (100 mg total) by mouth 3 (three) times daily.   Gemtesa 75 MG Tabs Generic drug: Vibegron Take 75 mg by mouth at bedtime.   HumuLIN R 100 units/mL injection Generic drug: insulin regular Inject 10 Units total into the skin 3 (three) times daily before meals. What changed:  how much to take how to take this when to take this   HYDROcodone-acetaminophen 5-325 MG tablet Commonly known as: NORCO/VICODIN Take 1 tablet by mouth every 4 (four) hours as needed for moderate pain or severe pain.   insulin NPH Human 100 UNIT/ML injection Commonly known as: HumuLIN N Inject 20 units under the skin in the morning and 14 units in evening   metFORMIN 500 MG 24 hr tablet Commonly known as: GLUCOPHAGE-XR Take 1 tablet by mouth 2 times daily   multivitamin with  minerals Tabs tablet Take 1 tablet by mouth daily.   nitroGLYCERIN 0.4 MG SL tablet Commonly known as: NITROSTAT Place 1 tablet (0.4 mg total) under the tongue every 5 (five) minutes as needed for chest pain.   omeprazole 20 MG capsule Commonly known as: PRILOSEC Take 1 capsule (20 mg total) by mouth daily.   potassium chloride SA 20 MEQ tablet Commonly known as: KLOR-CON M Take 1 tablet (20 mEq total) by mouth daily. Take in concurrence with torsemide. What changed:  how much to take how to take this when to take this additional instructions   primidone 50 MG tablet Commonly known as: MYSOLINE Take 1 tablet (50 mg total) by mouth every morning.   Repatha SureClick 140 MG/ML Soaj Generic drug: Evolocumab Inject 1 dose into the skin every 14 (fourteen) days.   Synthroid 137 MCG tablet Generic drug: levothyroxine Take 1 tablet (137 mcg total) by mouth every morning on empty stomach.   torsemide 20 MG tablet Commonly known as: DEMADEX Take 2 tablets (40 mg  total) by mouth daily.   UltiCare Insulin Syringe 31G X 5/16" 1 ML Misc Generic drug: Insulin Syringe-Needle U-100 Inject 4 times daily subcutaneously       Disposition: Home Diet recommendation: Cardiac diet  Discharge Exam: Vitals:   10/18/22 0500 10/18/22 0537 10/18/22 0811 10/18/22 1051  BP:  118/65 134/71 (!) 114/51  Pulse:  65 63   Resp:  14 19   Temp:  97.6 F (36.4 C) 97.7 F (36.5 C)   TempSrc:  Oral    SpO2:  94% 97%   Weight: 82 kg     Height:       General: Appear in no distress; no visible Abnormal Neck Mass Or lumps, Conjunctiva normal Cardiovascular: S1 and S2 Present, no Murmur, Respiratory: good respiratory effort, Bilateral Air entry present and CTA, no Crackles, no wheezes Abdomen: Bowel Sound present, Non tender  Extremities: no Pedal edema Neurology: alert and oriented to time, place, and person  Filed Weights   10/16/22 1302 10/16/22 2145 10/18/22 0500  Weight: 80 kg 80.5 kg 82  kg   Condition at discharge: stable  The results of significant diagnostics from this hospitalization (including imaging, microbiology, ancillary and laboratory) are listed below for reference.   Imaging Studies: CT Angio Chest Aorta w/CM &/OR wo/CM  Addendum Date: 10/18/2022   ADDENDUM REPORT: 10/18/2022 08:10 ADDENDUM: This report is addended to include the following finding in the musculoskeletal section: No substantial change in appearance of distal left clavicular fracture when compared to 09/01/2022. Electronically Signed   By: Agustin Cree M.D.   On: 10/18/2022 08:10   Result Date: 10/18/2022 CLINICAL DATA:  Chest pain, acute aortic syndrome suspected EXAM: CT ANGIOGRAPHY CHEST WITH CONTRAST TECHNIQUE: Multidetector CT imaging of the chest was performed using the standard protocol during bolus administration of intravenous contrast. Multiplanar CT image reconstructions and MIPs were obtained to evaluate the vascular anatomy. RADIATION DOSE REDUCTION: This exam was performed according to the departmental dose-optimization program which includes automated exposure control, adjustment of the mA and/or kV according to patient size and/or use of iterative reconstruction technique. CONTRAST:  75mL OMNIPAQUE IOHEXOL 350 MG/ML SOLN COMPARISON:  CT chest dated 09/01/2022 FINDINGS: Cardiovascular: Preferential opacification of the thoracic aorta. No evidence of thoracic aortic aneurysm or dissection. Status post aortic valve replacement. Multichamber cardiomegaly. No pericardial effusion. No central pulmonary emboli. The proximal common carotid artery not well seen due to beam hardening artifact from the left shoulder arthroplasty. Main branch vessels from the aorta are otherwise patent. Coronary artery calcifications and aortic atherosclerosis. Mediastinum/Nodes: Imaged thyroid gland without nodules meeting criteria for imaging follow-up by size. Small hiatal hernia. No pathologically enlarged axillary,  supraclavicular, mediastinal, or hilar lymph nodes. Lungs/Pleura: The central airways are patent. Bilateral lower lobe subsegmental mucous plugging. Bilateral dependent ground-glass densities. No pneumothorax. No pleural effusion. Upper abdomen: Normal. Musculoskeletal: No acute or abnormal lytic or blastic osseous lesions. Left shoulder arthroplasty. Review of the MIP images confirms the above findings. IMPRESSION: 1. No evidence of thoracic aortic aneurysm or dissection. 2. Bilateral dependent ground-glass densities, likely atelectasis. 3. Multichamber cardiomegaly. 4.  Aortic Atherosclerosis (ICD10-I70.0). Electronically Signed: By: Agustin Cree M.D. On: 10/16/2022 16:19   CT Head Wo Contrast  Result Date: 10/16/2022 CLINICAL DATA:  Headache, dizziness, recent fall EXAM: CT HEAD WITHOUT CONTRAST TECHNIQUE: Contiguous axial images were obtained from the base of the skull through the vertex without intravenous contrast. RADIATION DOSE REDUCTION: This exam was performed according to the departmental dose-optimization program which includes automated  exposure control, adjustment of the mA and/or kV according to patient size and/or use of iterative reconstruction technique. COMPARISON:  09/01/2022 FINDINGS: Brain: No acute infarct or hemorrhage. Lateral ventricles and midline structures are unremarkable. No acute extra-axial fluid collections. No mass effect. Vascular: Residual intravascular contrast from preceding CT of the chest. Minimal atherosclerosis. Skull: Normal. Negative for fracture or focal lesion. Sinuses/Orbits: No acute finding. Other: None. IMPRESSION: 1. No acute intracranial process. Electronically Signed   By: Sharlet Salina M.D.   On: 10/16/2022 16:35   DG Chest Port 1 View  Result Date: 10/16/2022 CLINICAL DATA:  Chest pain for 2 days EXAM: PORTABLE CHEST 1 VIEW COMPARISON:  04/25/2022 FINDINGS: Cardiomegaly. Aortic valve prosthesis. Unchanged mediastinal contours. Aortic atherosclerosis. No  new focal pulmonary opacity. No pleural effusion or pneumothorax. Interval left reverse shoulder arthroplasty. Degenerative changes in the right shoulder. No acute osseous abnormality. IMPRESSION: No acute cardiopulmonary process. Electronically Signed   By: Wiliam Ke M.D.   On: 10/16/2022 14:47    Microbiology: Results for orders placed or performed during the hospital encounter of 05/16/22  Surgical pcr screen     Status: Abnormal   Collection Time: 05/16/22  1:30 PM   Specimen: Nasal Mucosa; Nasal Swab  Result Value Ref Range Status   MRSA, PCR NEGATIVE NEGATIVE Final   Staphylococcus aureus POSITIVE (A) NEGATIVE Final    Comment: (NOTE) The Xpert SA Assay (FDA approved for NASAL specimens in patients 85 years of age and older), is one component of a comprehensive surveillance program. It is not intended to diagnose infection nor to guide or monitor treatment. Performed at [facility], [address] [place], [place] [address]    *Note: Due to a large number of results and/or encounters for the requested time period, some results have not been displayed. A complete set of results can be found in Results Review.   Labs: CBC: Recent Labs  Lab 10/16/22 1313 10/17/22 0454  WBC 5.8 6.5  NEUTROABS 3.8  --   HGB 11.2* 9.8*  HCT 36.3 31.6*  MCV 66.5* 63.2*  PLT 169 149*   Basic Metabolic Panel: Recent Labs  Lab 10/16/22 1313 10/17/22 0454  NA 139 136  K 4.4 3.9  CL 104 105  CO2 17* 23  GLUCOSE 130* 126*  BUN 17 13  CREATININE 0.87 0.80  CALCIUM 10.5* 9.7   Liver Function Tests: Recent Labs  Lab 10/17/22 0454  AST 20  ALT 20  ALKPHOS 77  BILITOT 0.6  PROT 6.0*  ALBUMIN 3.4*   CBG: Recent Labs  Lab 10/17/22 1701 10/17/22 2110 10/18/22 0810 10/18/22 1038 10/18/22 1156  GLUCAP 154* 166* 129* 211* 200*    Discharge time spent: greater than 30 minutes.  Author: Lynden Oxford, MD  [facility] 10/18/2022

## 2022-10-20 ENCOUNTER — Other Ambulatory Visit (HOSPITAL_BASED_OUTPATIENT_CLINIC_OR_DEPARTMENT_OTHER): Payer: Self-pay

## 2022-10-22 ENCOUNTER — Encounter: Payer: Self-pay | Admitting: Hematology

## 2022-10-22 ENCOUNTER — Telehealth: Payer: Self-pay | Admitting: Gastroenterology

## 2022-10-22 DIAGNOSIS — N3281 Overactive bladder: Secondary | ICD-10-CM | POA: Diagnosis not present

## 2022-10-22 NOTE — Telephone Encounter (Signed)
Inbound call from patient regarding 6/26 appointment. States she was unable to make it due to being at the ED. Called to rescheduled appointment. Advised of next available appointment in October. Stated she is due for a liver ultrasound. Requesting a call back regarding this. Please advise, thank you.

## 2022-10-22 NOTE — Telephone Encounter (Signed)
Message sent to schedulers to call patient to reschedule her RUQ U/S.  Moved her office appointment with [name] up to August 28. Called patient and spoke to patient's husband. He confirmed appointment on 8-28 and understands to be on the look out for a call from [facility] scheduling for her to get rescheduled for RUQ U/S.

## 2022-10-24 DIAGNOSIS — M25561 Pain in right knee: Secondary | ICD-10-CM | POA: Diagnosis not present

## 2022-10-26 ENCOUNTER — Ambulatory Visit (HOSPITAL_COMMUNITY)
Admission: RE | Admit: 2022-10-26 | Discharge: 2022-10-26 | Disposition: A | Discharge status: Home / Self Care | Payer: Medicare Other | Source: Ambulatory Visit | Attending: Gastroenterology | Admitting: Gastroenterology

## 2022-10-26 DIAGNOSIS — K7689 Other specified diseases of liver: Secondary | ICD-10-CM | POA: Diagnosis not present

## 2022-10-26 DIAGNOSIS — K7581 Nonalcoholic steatohepatitis (NASH): Secondary | ICD-10-CM | POA: Diagnosis not present

## 2022-10-26 DIAGNOSIS — Z9049 Acquired absence of other specified parts of digestive tract: Secondary | ICD-10-CM | POA: Diagnosis not present

## 2022-10-26 DIAGNOSIS — K7460 Unspecified cirrhosis of liver: Secondary | ICD-10-CM | POA: Insufficient documentation

## 2022-10-29 ENCOUNTER — Encounter: Payer: Self-pay | Admitting: Hematology

## 2022-10-29 ENCOUNTER — Other Ambulatory Visit (HOSPITAL_BASED_OUTPATIENT_CLINIC_OR_DEPARTMENT_OTHER): Payer: Self-pay

## 2022-10-30 ENCOUNTER — Other Ambulatory Visit (HOSPITAL_BASED_OUTPATIENT_CLINIC_OR_DEPARTMENT_OTHER): Payer: Self-pay

## 2022-10-30 ENCOUNTER — Ambulatory Visit: Payer: Medicare Other | Admitting: Neurology

## 2022-10-30 ENCOUNTER — Encounter: Payer: Self-pay | Admitting: Neurology

## 2022-10-30 ENCOUNTER — Encounter: Payer: Self-pay | Admitting: Hematology

## 2022-10-30 VITALS — BP 104/59 | HR 68 | Ht 65.0 in

## 2022-10-30 DIAGNOSIS — I4891 Unspecified atrial fibrillation: Secondary | ICD-10-CM

## 2022-10-30 DIAGNOSIS — G20C Parkinsonism, unspecified: Secondary | ICD-10-CM | POA: Diagnosis not present

## 2022-10-30 DIAGNOSIS — R251 Tremor, unspecified: Secondary | ICD-10-CM

## 2022-10-30 DIAGNOSIS — B955 Unspecified streptococcus as the cause of diseases classified elsewhere: Secondary | ICD-10-CM | POA: Diagnosis not present

## 2022-10-30 DIAGNOSIS — I5031 Acute diastolic (congestive) heart failure: Secondary | ICD-10-CM | POA: Diagnosis not present

## 2022-10-30 DIAGNOSIS — R7881 Bacteremia: Secondary | ICD-10-CM | POA: Diagnosis not present

## 2022-10-30 MED ORDER — AMANTADINE HCL 50 MG/5ML PO SOLN
50.0000 mg | Freq: Two times a day (BID) | ORAL | 5 refills | Status: DC
  Filled 2022-10-30 – 2022-11-01 (×2): qty 473, 47d supply, fill #0

## 2022-10-30 MED ORDER — PRIMIDONE 50 MG PO TABS
50.0000 mg | ORAL_TABLET | Freq: Two times a day (BID) | ORAL | 3 refills | Status: DC
  Filled 2022-10-30 – 2022-11-15 (×4): qty 60, 30d supply, fill #0
  Filled 2022-12-21: qty 60, 30d supply, fill #1
  Filled 2023-01-23: qty 60, 30d supply, fill #2

## 2022-10-30 NOTE — Progress Notes (Signed)
Provider:  Melvyn Novas, MD  Primary Care Physician:  Shirline Frees, NP [address] [place] [address]     Referring Provider: Shirline Frees, Np [address] [place],  [place] [address]          Chief Complaint according to patient   Patient presents with:     New Patient (Initial Visit)           HISTORY OF PRESENT ILLNESS:    ATYPICAL Parkinsonism, Parkinson Plus?   Breanna Webster is a 77 y.o. female patient who is here for revisit 10/30/2022 for  follow up on Parkinsonism, atypical . Feeling stiff all over.  Tremor in both hands.  Ankle edema. Pin and needs under her breasts and itchy, stiff, SOB.   Chief concern according to patient :  Pt with husband, rm 1. Had a fall 5 wks ago and had surgery on clavicle. She was in the hospital last week for 3 days for tingling and pin pricks all over breast. They thought it was shingles but it wasn't. States that symptoms have resolved. 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    I have offered a blood test that measures the level of glutamic acid decarboxylase (GAD) antibodies.  I also want to add amantadine to her  regimen.  I also wanted to increase the phenobarbitol  for tremor.  Her left arm and hand has the highest amplitude,    RV 10-25-2021, This 77 year -old  patient reports she became nauseated on carbi-levo dopa, used it TID and BID, in 2 different doses. and stopped 5 days ago,  After trying carbidopa levodopa.  We had tried to ease her and was able twice daily regimen but this was not tolerated either.  She had taken the medication with a cracker with water before the meal and she has still not seen on affect neither has her husband seen an effect on her left hand dominant tremor. We are meeting today to see if this is a vascular parkinsonism that could benefit from a dopaminergic agonist instead of dopamine replacement therapy.  I would like to  reiterate that this patient had multifocal bihemispheric cardioembolic strokes after new onset A-fib related to an endocarditis. I will now place her on Ropinorol.  She reports her left leg feels heavy and clumsy. Her balance has improved. Her resting tremor happened almost suddenly, and I suspected a vascular onset .  Occupation therapy documented improvement but she is weaker in the left hand, less pinch strength and slower.         Rv: 06-21-2021: Rv with Melvyn Novas, MD  Breanna Webster is a 77 y.o. year old female presented with increased shortness of breath and confusion x2 weeks along with headache on 10/21/2018 with recent admission on 10/03/2018 for sepsis and mitral valve endocarditis.  Stroke work-up revealed multifocal bilateral anterior and posterior infarcts cardioembolic pattern secondary to new onset A. fib versus recent endocarditis. Vascular risk factors include history of DVT, aortic stenosis status post TAVR 06/2018, pulmonary hypertension, HTN, DM, CAD status post stenting 03/2018, endocarditis, and A. Fib on Eliquis. Prior dx of sleep apnea but on recent sleep study no evidence of sleep apnea She has seen ophthalmologist, Dr. Dione Booze, and he felt strongly that her vision changes were due to stroke.  She is on Eloquis. Transient right eye vision loss, attributed to  CVA, atrial fibrillation. She never felt any symptoms such as palpitations. Amaurosis fugax, 2 minutes, resolved- diplopia resolved within seconds of onset.   Plans on following up with Dr. Dione Booze for further evaluation including visual field testing.  Left hand tremor, worsening: this was present since prior stroke. Its larger amplitude. It's left side only- and looks very parkinsonian.  Cerebral angiogram was discussed with Dr.Groat. Breanna Webster is her cardiologist.    10/21/2018 with recent admission on 10/03/2018 for sepsis and mitral valve endocarditis.  Stroke work-up revealed multifocal bilateral anterior and  posterior infarcts cardioembolic pattern secondary to new onset A. fib versus recent endocarditis. Vascular risk factors include history of DVT, aortic stenosis status post TAVR 06/2018, pulmonary hypertension, HTN, DM, CAD status post stenting 03/2018, endocarditis, and A. Fib on Eliquis. Prior dx of sleep apnea but on recent sleep study no evidence of sleep apnea       Update 04/13/2021 Breanna Webster- Stroke  Patient returns for acute visit due to increased falls and multiple other concerns.  Falls:  ED eval 12/14 post fall with c/o left flank pain and HA. CTH no acute finding. Lab work showed stable chronic anemia and hyperglycemia with improvement after D50. Fall occurred after "tripping over her feet" per husband. Per ED note, left orbital and left flank bruising. CT abdomen unremarkable. Reports fall 2 weeks ago where she lost her balance -felt like she was being pulled towards the left.  Denies associated dizziness at that time but does have chronic intermittent dizziness. Imbalance present since her stroke. Does admit to limited physical activity or exercise due to fear of falling - she reports she keeps busy going to different doctors appointments. She does report chronic b/l knee and lower back pain.  Chronic imbalance from stroke 2.5 years ago. She is concerned that this has worsened over the past 2 weeks and very concerned about possible new stroke. Denies any increased stressors or new medications.    Left Lower Breast Pain:  complaining of sharp left breast pain which started after fall last week. It is constant but worse with coughing, sneezing, lying down and with deep breaths. Radiates into back and right midline anterior. CT abdomen obtained at ED visit which was unremarkable. She is trying to schedule an appt with her cardiologist. Has appt with PCP tomorrow morning.    Transient right eye vision loss:  chronic issue. Prior extensive work-up unremarkable. Plans on following up with  Dr. Dione Booze for further evaluation Left hand tremor, worsening: this was present since prior stroke. Feels like this is worsening as it is "moving up my arm" where previously only in hand. Does not interfere with daily activity or functioning.     Review of Systems: Out of a complete 14 system review, the patient complains of only the following symptoms, and all other reviewed systems are negative.:   Social History   Socioeconomic History   Marital status: Married    Spouse name: Not on file   Number of children: 2   Years of education: Not on file   Highest education level: Not on file  Occupational History   Occupation: Health and safety inspector of the [employer]  Tobacco Use   Smoking status: Never    Passive exposure: Never   Smokeless tobacco: Never  Vaping Use   Vaping Use: Never used  Substance and Sexual Activity   Alcohol use: No    Alcohol/week: 0.0 standard drinks of alcohol   Drug use: Never   Sexual  activity: Not Currently  Other Topics Concern   Not on file  Social History Narrative   Lives with husband in a one story home.     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   Education: college.   Social Determinants of Health   Financial Resource Strain: Low Risk  (08/29/2022)   Overall Financial Resource Strain (CARDIA)    Difficulty of Paying Living Expenses: Not hard at all  Food Insecurity: No Food Insecurity (10/16/2022)   Hunger Vital Sign    Worried About Running Out of Food in the Last Year: Never true    Ran Out of Food in the Last Year: Never true  Transportation Needs: No Transportation Needs (10/16/2022)   PRAPARE - Administrator, Civil Service (Medical): No    Lack of Transportation (Non-Medical): No  Physical Activity: Insufficiently Active (08/29/2022)   Exercise Vital Sign    Days of Exercise per Week: 2 days    Minutes of Exercise per Session: 40 min  Stress: No  Stress Concern Present (08/29/2022)   [employer] of Occupational Health - Occupational Stress Questionnaire    Feeling of Stress : Not at all  Social Connections: Socially Integrated (08/29/2022)   Social Connection and Isolation Panel [NHANES]    Frequency of Communication with Friends and Family: More than three times a week    Frequency of Social Gatherings with Friends and Family: More than three times a week    Attends Religious Services: More than 4 times per year    Active Member of [employer] or Organizations: Yes    Attends Engineer, structural: More than 4 times per year    Marital Status: Married    Family History  Adopted: Yes  Problem Relation Age of Onset   Healthy Son        x 2   Headache Other        Cluster headaches   Heart failure Mother    Colon cancer Neg Hx    Pancreatic cancer Neg Hx    Rectal cancer Neg Hx    Stomach cancer Neg Hx     Past Medical History:  Diagnosis Date   Anxiety    Arthritis    "back" (04/22/2018)   Atrial fibrillation with RVR (HCC) 10/21/2018   Back pain    Bacteremia due to Streptococcus Salivarius 10/07/2018   Blood transfusion without reported diagnosis    CAD (coronary artery disease)    a. 03/2018 s/p PCI/DES to the RCA (3.0x15 Onyx DES).   Carotid artery stenosis    Mild   Cerebellar stroke, acute (HCC) 10/21/2018   Cerebrovascular accident (CVA) due to embolism of right posterior cerebral artery (HCC) 08/03/2020   Chest pain    CHF (congestive heart failure) (HCC)    Chronic lower back pain    Cirrhosis (HCC)    Colon polyps    Diverticulitis    Diverticulosis    Esophageal thickening    seen on pre TAVR CT scan, also questionable cirrhosis. MRI recommended. Will refer to GI   GERD (gastroesophageal reflux disease)    Grave's disease    H/O total shoulder replacement, left 06/01/2022   Heart murmur    History of cardioembolic stroke 03/29/2020   History of colonic polyps 05/22/2017   History of  hiatal hernia    Hyperlipidemia    Hypertension    Hypothyroidism    IBS (irritable  bowel syndrome)    Osteopenia    Parkinson's syndrome 04/23/2020   Pulmonary nodules    seen on pre TAVR CT. likley benign. no follow up recommended if pt low risk.   S/P TAVR (transcatheter aortic valve replacement)    Shortness of breath on exertion    Stroke (HCC) 2021   x2   Thalassemia minor    Thyroid disease    Type II diabetes mellitus (HCC)     Past Surgical History:  Procedure Laterality Date   16 HOUR PH STUDY N/A 03/03/2018   Procedure: 24 HOUR PH STUDY;  Surgeon: Napoleon Form, MD;  Location: [facility] END[facility];  Service: Endoscopy;  Laterality: N/A;   COLONOSCOPY  2023   COLONOSCOPY W/ BIOPSIES AND POLYPECTOMY     CORONARY ANGIOGRAPHY Right 04/21/2018   Procedure: CORONARY ANGIOGRAPHY (CATH LAB);  Surgeon: Lyn Records, MD;  Location: [facility] INVASIVE CV LAB;  Service: Cardiovascular;  Laterality: Right;   CORONARY STENT INTERVENTION N/A 04/22/2018   Procedure: CORONARY STENT INTERVENTION;  Surgeon: Lyn Records, MD;  Location: [facility] INVASIVE CV LAB;  Service: Cardiovascular;  Laterality: N/A;   DILATION AND CURETTAGE OF UTERUS     ESOPHAGEAL MANOMETRY N/A 03/03/2018   Procedure: ESOPHAGEAL MANOMETRY (EM);  Surgeon: Napoleon Form, MD;  Location: [facility];  Service: Endoscopy;  Laterality: N/A;   HYSTEROSCOPY     fibroids   LAPAROSCOPIC CHOLECYSTECTOMY  1985   LAPAROSCOPY     fibroids   NISSEN FUNDOPLICATION  1990s   POLYPECTOMY     REVERSE SHOULDER ARTHROPLASTY Left 06/01/2022   Procedure: REVERSE SHOULDER ARTHROPLASTY;  Surgeon: Beverely Low, MD;  Location: [facility] ORS;  Service: Orthopedics;  Laterality: Left;  choice with interscalene block   RIGHT/LEFT HEART CATH AND CORONARY ANGIOGRAPHY N/A 02/20/2018   Procedure: RIGHT/LEFT HEART CATH AND CORONARY ANGIOGRAPHY;  Surgeon: Lyn Records, MD;  Location: [facility] INVASIVE CV LAB;  Service: Cardiovascular;  Laterality: N/A;   TEE  WITHOUT CARDIOVERSION N/A 07/08/2018   Procedure: TRANSESOPHAGEAL ECHOCARDIOGRAM (TEE);  Surgeon: Kathleene Hazel, MD;  Location: [facility] OR;  Service: Open Heart Surgery;  Laterality: N/A;   TEE WITHOUT CARDIOVERSION  10/07/2018   TEE WITHOUT CARDIOVERSION N/A 10/07/2018   Procedure: TRANSESOPHAGEAL ECHOCARDIOGRAM (TEE);  Surgeon: Jake Bathe, MD;  Location: [facility] ENDOSCOPY;  Service: Cardiovascular;  Laterality: N/A;   TONSILLECTOMY     age 62   TRANSCATHETER AORTIC VALVE REPLACEMENT, TRANSFEMORAL N/A 07/08/2018   Procedure: TRANSCATHETER AORTIC VALVE REPLACEMENT, TRANSFEMORAL;  Surgeon: Kathleene Hazel, MD;  Location: [facility] OR;  Service: Open Heart Surgery;  Laterality: N/A;     Current Outpatient Medications on File Prior to Visit  Medication Sig Dispense Refill   apixaban (ELIQUIS) 5 MG TABS tablet Take 1 tablet (5 mg total) by mouth 2 (two) times daily. 180 tablet 3   b complex vitamins capsule Take 1 capsule by mouth daily.     BD PEN NEEDLE NANO 2ND GEN 32G X 4 MM MISC      Continuous Blood Gluc Receiver (FREESTYLE LIBRE READER) DEVI Use as directed 1 each 0   Continuous Glucose Sensor (FREESTYLE LIBRE 3 SENSOR) MISC Place one sensor every 14 days 2 each 11   diltiazem (CARDIZEM CD) 120 MG 24 hr capsule Take 1 capsule (120 mg total) by mouth daily. 90 capsule 3   escitalopram (LEXAPRO) 20 MG tablet TAKE ONE TABLET BY MOUTH DAILY (Patient taking differently: Take 20 mg by mouth at bedtime.) 90 tablet 1  gabapentin (NEURONTIN) 100 MG capsule Take 1 capsule (100 mg total) by mouth 3 (three) times daily. 90 capsule 0   HYDROcodone-acetaminophen (NORCO/VICODIN) 5-325 MG tablet Take 1 tablet by mouth every 4 (four) hours as needed for moderate pain or severe pain. 30 tablet 0   insulin NPH Human (HUMULIN N) 100 UNIT/ML injection Inject 20 units under the skin in the morning and 14 units in evening 20 mL 5   Insulin Pen Needle (UNIFINE PENTIPS) 32G X 4 MM MISC Inject 4 times  subcutaneously 400 each 2   insulin regular (HUMULIN R) 100 units/mL injection Inject 10 Units total into the skin 3 (three) times daily before meals. (Patient taking differently: Inject 10 Units into the skin daily.) 10 mL 5   Insulin Syringe-Needle U-100 (ULTICARE INSULIN SYRINGE) 31G X 5/16" 1 ML MISC Inject 4 times daily subcutaneously 100 each 1   metFORMIN (GLUCOPHAGE-XR) 500 MG 24 hr tablet Take 1 tablet by mouth 2 times daily 180 tablet 2   Multiple Vitamin (MULITIVITAMIN WITH MINERALS) TABS Take 1 tablet by mouth daily.     nitroGLYCERIN (NITROSTAT) 0.4 MG SL tablet Place 1 tablet (0.4 mg total) under the tongue every 5 (five) minutes as needed for chest pain. 25 tablet 4   omeprazole (PRILOSEC) 20 MG capsule Take 1 capsule (20 mg total) by mouth daily. 90 capsule 1   potassium chloride SA (KLOR-CON M) 20 MEQ tablet Take 1 tablet (20 mEq total) by mouth daily. Take in concurrence with torsemide. 40 tablet 0   primidone (MYSOLINE) 50 MG tablet Take 1 tablet (50 mg total) by mouth every morning. 30 tablet 3   SYNTHROID 137 MCG tablet Take 1 tablet (137 mcg total) by mouth every morning on empty stomach. 90 tablet 1   torsemide (DEMADEX) 20 MG tablet Take 2 tablets (40 mg total) by mouth daily. 60 tablet 6   Vibegron (GEMTESA) 75 MG TABS Take 75 mg by mouth at bedtime.     amoxicillin (AMOXIL) 500 MG capsule Take 2 capsules by mouth 1 hour before dentist or other procedures and 2 capsules by mouth after visit (Patient not taking: Reported on 10/17/2022) 4 capsule 4   No current facility-administered medications on file prior to visit.    No Known Allergies   DIAGNOSTIC DATA (LABS, IMAGING, TESTING) - I reviewed patient records, labs, notes, testing and imaging myself where available.  Lab Results  Component Value Date   WBC 6.5 10/17/2022   HGB 9.8 (L) 10/17/2022   HCT 31.6 (L) 10/17/2022   MCV 63.2 (L) 10/17/2022   PLT 149 (L) 10/17/2022      Component Value Date/Time   NA 136  10/17/2022 0454   NA 139 10/04/2022 1452   K 3.9 10/17/2022 0454   CL 105 10/17/2022 0454   CO2 23 10/17/2022 0454   GLUCOSE 126 (H) 10/17/2022 0454   GLUCOSE 135 09/08/2008 0000   BUN 13 10/17/2022 0454   BUN 24 10/04/2022 1452   CREATININE 0.80 10/17/2022 0454   CREATININE 0.87 02/26/2022 1037   CALCIUM 9.7 10/17/2022 0454   PROT 6.0 (L) 10/17/2022 0454   PROT 6.9 02/26/2017 1116   ALBUMIN 3.4 (L) 10/17/2022 0454   ALBUMIN 3.9 02/26/2017 1116   AST 20 10/17/2022 0454   AST 27 02/26/2022 1037   ALT 20 10/17/2022 0454   ALT 32 02/26/2022 1037   ALKPHOS 77 10/17/2022 0454   BILITOT 0.6 10/17/2022 0454   BILITOT 1.2 02/26/2022 1037   GFRNONAA >  60 10/17/2022 0454   GFRNONAA >60 02/26/2022 1037   GFRAA 99 12/05/2020 0000   GFRAA >60 08/14/2019 1025   Lab Results  Component Value Date   CHOL 106 09/06/2021   HDL 50 09/06/2021   LDLCALC 36 09/06/2021   LDLDIRECT 98.0 05/24/2014   TRIG 101 09/06/2021   CHOLHDL 2.1 09/06/2021   Lab Results  Component Value Date   HGBA1C 5.9 (H) 05/16/2022   Lab Results  Component Value Date   VITAMINB12 575 04/30/2019   Lab Results  Component Value Date   TSH 1.44 12/05/2020    PHYSICAL EXAM:  Today's Vitals   10/30/22 1431  BP: (!) 104/59  Pulse: 68  Height: 5\' 5"  (1.651 m)   Body mass index is 30.07 kg/m.   Wt Readings from Last 3 Encounters:  10/18/22 180 lb 11.2 oz (82 kg)  10/04/22 176 lb 9.6 oz (80.1 kg)  09/19/22 182 lb (82.6 kg)     Ht Readings from Last 3 Encounters:  10/30/22 5\' 5"  (1.651 m)  10/16/22 5\' 4"  (1.626 m)  10/04/22 5\' 5"  (1.651 m)      General: well developed, well nourished, pleasant elderly Caucasian female, seated, mildly anxious Head: head normocephalic and atraumatic.   Neck: supple with no carotid or supraclavicular bruits Cardiovascular: regular rate and rhythm, no murmurs Musculoskeletal: no deformity; pain with pressure over left lateral rib area Skin:  no rash/petichiae; ecchymosis  left orbital and flank area, oedema  in the left arm, hand, fingers, and shoulder left is droopy- she broke her left clavicula.  Vascular:  Normal pulses in all extremities.   Neurologic Exam Mental Status: Awake and fully alert. Fluent speech and language. Titubation.  Oriented to place and time.  Recent memory mildly impaired and remote memory intact.   Attention span, concentration and fund of knowledge mostly appropriate. Mood and affect  are  desperate, depressed,anxious, and easily agitated.   Cranial Nerves: Pupils equal, briskly reactive to light. Extraocular movements full without nystagmus although mild OS convergence insufficiency.  Visual fields full to confrontation.  Hearing intact. Facial sensation intact.  Slightly masked face.   Mild left lower facial weakness - chronic , titubation. SOB  Motor:left arm weakness related to recent fracture- droopy shoulder.   Left arm and hand swelling- ROM limitation due to severe pain, had shoulder replacement.  Full strength in all tested extremities except slight decreased left hand dexterity and mild tremor with outstretched arm Sensory.: intact to touch, pinprick , position and vibratory sensation.  Coordination:tremor at rest , large amplitude when excited or agitated. Much higher today left over right  Finger-to-nose showed severe left hand ataxia . Gait and Station: wheelchair seated today  Reflexes: 2+ and symmetric.  Toes downgoing.     ASSESSMENT AND PLAN     1)  Breanna Webster is a 77 y.o. female patient who is here for revisit 10/30/2022 for  follow up on a recent fall, hospitalization, clavicle fracture on the left due to fall. Parkinsonism, atypical . Feeling stiff all over.  Tremor in both hands.  Ankle edema. Pin and needs under her breasts and itchy, stiff, SOB.   2) I have offered a blood test that measures the level of glutamic acid decarboxylase (GAD) antibodies.  I also want to add amantadine to her  regimen.  I also wanted to increase the phenobarbitol  for tremor.  Her left hand has the highest amplitude,    I plan to follow up either personally  or through our NP within 3-6 months.   I would like to thank Shirline Frees, NP for allowing me to meet with and to take care of this pleasant patient.    After spending a total time of  35  minutes face to face and additional time for physical and neurologic examination, review of laboratory studies,  personal review of imaging studies, reports and results of other testing and review of referral information / records as far as provided in visit,   Electronically signed by: Melvyn Novas, MD 10/30/2022 3:03 PM  [facility] Board certified by The [employer] of Sleep Medicine and Diplomate of the [employer] of Sleep Medicine. Board certified In Neurology through the ABPN, Fellow of the [employer] of Neurology.

## 2022-10-30 NOTE — Patient Instructions (Signed)
Spasticity Spasticity is a condition in which your muscles contract suddenly and unpredictably (spasm). Spasticity usually affects your arms, legs, or back. It can also affect the way you walk. Spasticity can range from mild muscle stiffness and tightness to severe, uncontrollable muscle spasms. Severe spasticity can be painful and can freeze your muscles in an uncomfortable position. Follow these instructions at home: Managing muscle stiffness and spasms     Wear a brace as told by your health care provider to prevent muscle contractions. Have the affected muscles massaged. If directed, apply heat to the affected muscle area. Use the heat source that your health care provider recommends, such as a moist heat pack or heating pad. Place a towel between your skin and the heat source. Leave the heat on for 20-30 minutes. Remove the heat if your skin turns bright red. This is especially important if you are unable to feel pain, heat, or cold. You may have a greater risk of getting burned. If directed, apply ice to the affected muscle area: Put ice in a plastic bag. Place a towel between your skin and the bag or between your brace and the bag. Leave the ice on for 20 minutes, 2?3 times a day. Activity Stay active as directed by your health care provider. Find a safe exercise program that fits your needs and ability. Maintain good posture when walking and sitting. Work with a physical therapist to learn exercises that will stretch and strengthen your muscles. Do stretching and range-of-motion exercises at home as told by a physical therapist. Work with an occupational therapist. This type of health care provider can help you function better at home and at work. If you have severe spasticity, use mobility aids, such as a walker or cane, as told by your health care provider. General instructions Watch your condition for any changes. Wear loose, comfortable clothing that does not restrict your  movement. Wear closed-toe shoes that fit well and support your feet. Wear shoes that have rubber soles or low heels. Take over-the-counter and prescription medicine only as told by your health care provider. Keep all follow-up visits as told by your health care provider. This is important. Contact a health care provider if you: Have worsening muscle spasms. Develop other symptoms along with spasticity. Have a fever or chills. Experience a burning feeling when you pass urine. Become constipated. Need more support at home. Get help right away if you: Have trouble breathing. Have a muscle spasm that freezes you into a painful position. Cannot walk. Cannot care for yourself at home. Have trouble passing urine or have urinary incontinence. Summary Spasticity is a condition in which your muscles contract suddenly and unpredictably (spasm). Spasticity usually affects your arms, legs, or back. Spasticity can range from mild muscle stiffness and tightness to severe, uncontrollable muscle spasms. Do stretching and range-of-motion exercises at home as told by a physical therapist. Take over-the-counter and prescription medicine only as told by your health care provider. This information is not intended to replace advice given to you by your health care provider. Make sure you discuss any questions you have with your health care provider. Document Revised: 04/02/2022 Document Reviewed: 04/02/2022 Elsevier Patient Education  2024 [employer].

## 2022-10-31 ENCOUNTER — Other Ambulatory Visit (HOSPITAL_BASED_OUTPATIENT_CLINIC_OR_DEPARTMENT_OTHER): Payer: Self-pay

## 2022-10-31 ENCOUNTER — Other Ambulatory Visit: Payer: Self-pay

## 2022-10-31 DIAGNOSIS — M25561 Pain in right knee: Secondary | ICD-10-CM | POA: Diagnosis not present

## 2022-11-01 ENCOUNTER — Other Ambulatory Visit: Payer: Self-pay

## 2022-11-01 ENCOUNTER — Other Ambulatory Visit (HOSPITAL_BASED_OUTPATIENT_CLINIC_OR_DEPARTMENT_OTHER): Payer: Self-pay

## 2022-11-01 DIAGNOSIS — M25512 Pain in left shoulder: Secondary | ICD-10-CM | POA: Diagnosis not present

## 2022-11-01 LAB — GLUTAMIC ACID DECARBOXYLASE AUTO ABS: Glutamic Acid Decarb Ab: <5 U/mL (ref 0.0–5.0)

## 2022-11-01 MED ORDER — HUMULIN N KWIKPEN 100 UNIT/ML ~~LOC~~ SUPN
20.0000 [IU] | PEN_INJECTOR | Freq: Two times a day (BID) | SUBCUTANEOUS | 6 refills | Status: DC
  Filled 2022-11-01: qty 15, 44d supply, fill #0
  Filled 2022-12-18: qty 15, 44d supply, fill #1
  Filled 2023-03-28: qty 15, 44d supply, fill #2

## 2022-11-02 ENCOUNTER — Telehealth: Payer: Self-pay | Admitting: Adult Health

## 2022-11-02 ENCOUNTER — Other Ambulatory Visit (HOSPITAL_BASED_OUTPATIENT_CLINIC_OR_DEPARTMENT_OTHER): Payer: Self-pay

## 2022-11-02 DIAGNOSIS — R3915 Urgency of urination: Secondary | ICD-10-CM | POA: Diagnosis not present

## 2022-11-02 DIAGNOSIS — M25512 Pain in left shoulder: Secondary | ICD-10-CM | POA: Diagnosis not present

## 2022-11-02 DIAGNOSIS — R350 Frequency of micturition: Secondary | ICD-10-CM | POA: Diagnosis not present

## 2022-11-02 DIAGNOSIS — M25561 Pain in right knee: Secondary | ICD-10-CM | POA: Diagnosis not present

## 2022-11-02 MED ORDER — TIZANIDINE HCL 2 MG PO TABS
2.0000 mg | ORAL_TABLET | Freq: Two times a day (BID) | ORAL | 0 refills | Status: DC | PRN
  Filled 2022-11-02: qty 60, 30d supply, fill #0

## 2022-11-02 NOTE — Telephone Encounter (Signed)
Pt husband called and wanted to make an appointment with Kandee Keen to sit down and talk about the medications she is to be taking, when he was informed that the first available appointment would be July 25th he stated he does not want to wait until July 25th to speak with [facility].  Pt requesting a call back.

## 2022-11-03 ENCOUNTER — Other Ambulatory Visit (HOSPITAL_BASED_OUTPATIENT_CLINIC_OR_DEPARTMENT_OTHER): Payer: Self-pay

## 2022-11-05 ENCOUNTER — Other Ambulatory Visit (HOSPITAL_BASED_OUTPATIENT_CLINIC_OR_DEPARTMENT_OTHER): Payer: Self-pay

## 2022-11-05 ENCOUNTER — Other Ambulatory Visit: Payer: Self-pay | Admitting: Adult Health

## 2022-11-05 DIAGNOSIS — Z952 Presence of prosthetic heart valve: Secondary | ICD-10-CM

## 2022-11-05 DIAGNOSIS — I5033 Acute on chronic diastolic (congestive) heart failure: Secondary | ICD-10-CM

## 2022-11-05 DIAGNOSIS — E118 Type 2 diabetes mellitus with unspecified complications: Secondary | ICD-10-CM

## 2022-11-05 DIAGNOSIS — M7989 Other specified soft tissue disorders: Secondary | ICD-10-CM

## 2022-11-05 DIAGNOSIS — Z79899 Other long term (current) drug therapy: Secondary | ICD-10-CM

## 2022-11-05 DIAGNOSIS — I5032 Chronic diastolic (congestive) heart failure: Secondary | ICD-10-CM

## 2022-11-05 NOTE — Progress Notes (Signed)
Please inform patient that her glutamic acid Ab was within normal limits. Dr. Vickey Huger to follow up with patient regarding next step.

## 2022-11-06 ENCOUNTER — Encounter: Payer: Self-pay | Admitting: Neurology

## 2022-11-06 ENCOUNTER — Other Ambulatory Visit: Payer: Self-pay

## 2022-11-06 NOTE — Telephone Encounter (Signed)
Please advise 

## 2022-11-06 NOTE — Telephone Encounter (Signed)
Pharm is calling checking on the refill. Please refill

## 2022-11-06 NOTE — Telephone Encounter (Signed)
Pt's husband is very upset that Pt has not had the refill for her Potassium sent to [facility] pharmacy yet.  Husband says NP knows Pt cannot take the torsemide (DEMADEX) 20 MG tablet without the potassium,  (and they will never see Dr. Allena Katz again) so NP needs to send Rx ASAP.   Husband is very frustrated.  Husband wants a call back with an explanation.

## 2022-11-06 NOTE — Addendum Note (Signed)
Addended by: Melvyn Novas on: 11/06/2022 05:58 PM   Modules accepted: Orders

## 2022-11-07 ENCOUNTER — Telehealth: Payer: Self-pay | Admitting: Neurology

## 2022-11-07 ENCOUNTER — Other Ambulatory Visit (HOSPITAL_BASED_OUTPATIENT_CLINIC_OR_DEPARTMENT_OTHER): Payer: Self-pay

## 2022-11-07 MED ORDER — POTASSIUM CHLORIDE CRYS ER 20 MEQ PO TBCR
20.0000 meq | EXTENDED_RELEASE_TABLET | Freq: Every day | ORAL | 0 refills | Status: DC
Start: 2022-11-07 — End: 2023-04-09
  Filled 2022-11-07: qty 40, 40d supply, fill #0

## 2022-11-07 NOTE — Telephone Encounter (Signed)
Spoke to pt spouse and he advised that they cannot do this via video. Pt spouse wanted to know how going on medications would work on video. I explained to pt spouse how that was possible. Spouse agreed and stated the appt. Shouldn't take no more than and wanted an in person office visit. I advised pt that our in office visits are 30 minutes but we did not have any available. Pt also advised that the slot Kandee Keen mentioned was an add on. Pt declined and stated that he has "doctor students" that he can skype to go over the pt medications. I advised that I would [employer]. Spouse verbalized understanding.

## 2022-11-07 NOTE — Telephone Encounter (Signed)
Pt called and I was able to review the results from the lab test that was completed. Advised the pt that referral was placed to [facility] for her. Continue medications. Pt verbalized understanding. Pt had no questions at this time but was encouraged to call back if questions arise.   "No stiff person biomarker found. I will investigate if she can see a movement disorder specialist at [facility] , [name] or [facility] for her tremor and atypical findings of  rigidity, non responsive to sinemet."

## 2022-11-07 NOTE — Telephone Encounter (Signed)
Pt stated she had a test done last week. Pt didn't know the name of test, stated she is following up on test results.

## 2022-11-07 NOTE — Telephone Encounter (Signed)
Referral for Neurology fax to [facility]. Phone: [phone]; fax: [phone].

## 2022-11-08 ENCOUNTER — Encounter: Payer: Self-pay | Admitting: Adult Health

## 2022-11-09 ENCOUNTER — Telehealth: Payer: Self-pay | Admitting: Adult Health

## 2022-11-09 ENCOUNTER — Other Ambulatory Visit (HOSPITAL_BASED_OUTPATIENT_CLINIC_OR_DEPARTMENT_OTHER): Payer: Self-pay

## 2022-11-09 NOTE — Telephone Encounter (Signed)
Patient's husband states that Dr. Drucie Ip at [facility] wants to take Ercell off of Lexapro and put her on Duloxetine.  He wants to do this today because the patient is in so much pain.  He knows Kandee Keen is out until Tuesday so he is requesting that Dr. Caryl Never take a look and see if he would do this for the patient.    Please call patient's husband to let him know the outcome.

## 2022-11-09 NOTE — Telephone Encounter (Signed)
Patient informed of the message below and voiced understanding  

## 2022-11-13 ENCOUNTER — Encounter: Payer: Self-pay | Admitting: Adult Health

## 2022-11-13 ENCOUNTER — Other Ambulatory Visit (HOSPITAL_BASED_OUTPATIENT_CLINIC_OR_DEPARTMENT_OTHER): Payer: Self-pay

## 2022-11-13 ENCOUNTER — Telehealth (INDEPENDENT_AMBULATORY_CARE_PROVIDER_SITE_OTHER): Payer: Medicare Other | Admitting: Adult Health

## 2022-11-13 DIAGNOSIS — G894 Chronic pain syndrome: Secondary | ICD-10-CM | POA: Diagnosis not present

## 2022-11-13 DIAGNOSIS — F419 Anxiety disorder, unspecified: Secondary | ICD-10-CM | POA: Diagnosis not present

## 2022-11-13 DIAGNOSIS — F32A Depression, unspecified: Secondary | ICD-10-CM | POA: Diagnosis not present

## 2022-11-13 MED ORDER — DULOXETINE HCL 30 MG PO CPEP
30.0000 mg | ORAL_CAPSULE | Freq: Every day | ORAL | 0 refills | Status: DC
Start: 2022-11-13 — End: 2022-12-25
  Filled 2022-11-13: qty 30, 30d supply, fill #0

## 2022-11-13 NOTE — Progress Notes (Signed)
Virtual Visit via Video Note  I connected with Breanna Webster  on 11/13/22 at  5:00 PM EDT by a video enabled telemedicine application and verified that I am speaking with the correct person using two identifiers.  Location patient: home Location provider:work or home office Persons participating in the virtual visit: patient, provider  I discussed the limitations of evaluation and management by telemedicine and the availability of in person appointments. The patient expressed understanding and agreed to proceed.   HPI: 77 year old female who  has a past medical history of Anxiety, Arthritis, Atrial fibrillation with RVR (HCC) (10/21/2018), Back pain, Bacteremia due to Streptococcus Salivarius (10/07/2018), Blood transfusion without reported diagnosis, CAD (coronary artery disease), Carotid artery stenosis, Cerebellar stroke, acute (HCC) (10/21/2018), Cerebrovascular accident (CVA) due to embolism of right posterior cerebral artery (HCC) (08/03/2020), Chest pain, CHF (congestive heart failure) (HCC), Chronic lower back pain, Cirrhosis (HCC), Colon polyps, Diverticulitis, Diverticulosis, Esophageal thickening, GERD (gastroesophageal reflux disease), Grave's disease, H/O total shoulder replacement, left (06/01/2022), Heart murmur, History of cardioembolic stroke (40/98/1191), History of colonic polyps (05/22/2017), History of hiatal hernia, Hyperlipidemia, Hypertension, Hypothyroidism, IBS (irritable bowel syndrome), Osteopenia, Parkinson's syndrome (04/23/2020), Pulmonary nodules, S/P TAVR (transcatheter aortic valve replacement), Shortness of breath on exertion, Stroke (HCC) (2021), Thalassemia minor, Thyroid disease, and Type II diabetes mellitus (HCC).  She is being evaluated today for anxiety/depression and chronic pain.  She is currently on Lexapro 20 mg for anxiety and depression.  She was seen by her pain management physician at [facility] and was advised to discuss with me on switching her  antidepression medicine from Lexapro to Cymbalta to see if this would help with chronic pain management.  She is also on Norco 5-3 25 that she takes every 4 hours as needed for moderate to severe pain.  She does not like taking this all the time.  Another provider in the office late last week advised her to start tapering by decreasing her dose in half to 10 mg and taking this every day and then follow-up with myself.  She does report that she has been cutting the medication in half, for the first few days after doing this she had frequent 9 episodes that have since resolved.     ROS: See pertinent positives and negatives per HPI.  Past Medical History:  Diagnosis Date   Anxiety    Arthritis    "back" (04/22/2018)   Atrial fibrillation with RVR (HCC) 10/21/2018   Back pain    Bacteremia due to Streptococcus Salivarius 10/07/2018   Blood transfusion without reported diagnosis    CAD (coronary artery disease)    a. 03/2018 s/p PCI/DES to the RCA (3.0x15 Onyx DES).   Carotid artery stenosis    Mild   Cerebellar stroke, acute (HCC) 10/21/2018   Cerebrovascular accident (CVA) due to embolism of right posterior cerebral artery (HCC) 08/03/2020   Chest pain    CHF (congestive heart failure) (HCC)    Chronic lower back pain    Cirrhosis (HCC)    Colon polyps    Diverticulitis    Diverticulosis    Esophageal thickening    seen on pre TAVR CT scan, also questionable cirrhosis. MRI recommended. Will refer to GI   GERD (gastroesophageal reflux disease)    Grave's disease    H/O total shoulder replacement, left 06/01/2022   Heart murmur    History of cardioembolic stroke 03/29/2020   History of colonic polyps 05/22/2017   History of hiatal hernia    Hyperlipidemia  Hypertension    Hypothyroidism    IBS (irritable bowel syndrome)    Osteopenia    Parkinson's syndrome 04/23/2020   Pulmonary nodules    seen on pre TAVR CT. likley benign. no follow up recommended if pt low risk.   S/P  TAVR (transcatheter aortic valve replacement)    Shortness of breath on exertion    Stroke (HCC) 2021   x2   Thalassemia minor    Thyroid disease    Type II diabetes mellitus (HCC)     Past Surgical History:  Procedure Laterality Date   53 HOUR PH STUDY N/A 03/03/2018   Procedure: 24 HOUR PH STUDY;  Surgeon: Napoleon Form, MD;  Location: [facility] ENDOSCOPY;  Service: Endoscopy;  Laterality: N/A;   COLONOSCOPY  2023   COLONOSCOPY W/ BIOPSIES AND POLYPECTOMY     CORONARY ANGIOGRAPHY Right 04/21/2018   Procedure: CORONARY ANGIOGRAPHY (CATH LAB);  Surgeon: Lyn Records, MD;  Location: [facility] INVASIVE CV LAB;  Service: Cardiovascular;  Laterality: Right;   CORONARY STENT INTERVENTION N/A 04/22/2018   Procedure: CORONARY STENT INTERVENTION;  Surgeon: Lyn Records, MD;  Location: [facility] INVASIVE CV LAB;  Service: Cardiovascular;  Laterality: N/A;   DILATION AND CURETTAGE OF UTERUS     ESOPHAGEAL MANOMETRY N/A 03/03/2018   Procedure: ESOPHAGEAL MANOMETRY (EM);  Surgeon: Napoleon Form, MD;  Location: [facility] ENDOSCOPY;  Service: Endoscopy;  Laterality: N/A;   HYSTEROSCOPY     fibroids   LAPAROSCOPIC CHOLECYSTECTOMY  1985   LAPAROSCOPY     fibroids   NISSEN FUNDOPLICATION  1990s   POLYPECTOMY     REVERSE SHOULDER ARTHROPLASTY Left 06/01/2022   Procedure: REVERSE SHOULDER ARTHROPLASTY;  Surgeon: Beverely Low, MD;  Location: [facility] ORS;  Service: Orthopedics;  Laterality: Left;  choice with interscalene block   RIGHT/LEFT HEART CATH AND CORONARY ANGIOGRAPHY N/A 02/20/2018   Procedure: RIGHT/LEFT HEART CATH AND CORONARY ANGIOGRAPHY;  Surgeon: Lyn Records, MD;  Location: [facility] INVASIVE CV LAB;  Service: Cardiovascular;  Laterality: N/A;   TEE WITHOUT CARDIOVERSION N/A 07/08/2018   Procedure: TRANSESOPHAGEAL ECHOCARDIOGRAM (TEE);  Surgeon: Kathleene Hazel, MD;  Location: [facility] OR;  Service: Open Heart Surgery;  Laterality: N/A;   TEE WITHOUT CARDIOVERSION  10/07/2018   TEE WITHOUT CARDIOVERSION N/A  10/07/2018   Procedure: TRANSESOPHAGEAL ECHOCARDIOGRAM (TEE);  Surgeon: Jake Bathe, MD;  Location: [facility] ENDOSCOPY;  Service: Cardiovascular;  Laterality: N/A;   TONSILLECTOMY     age 34   TRANSCATHETER AORTIC VALVE REPLACEMENT, TRANSFEMORAL N/A 07/08/2018   Procedure: TRANSCATHETER AORTIC VALVE REPLACEMENT, TRANSFEMORAL;  Surgeon: Kathleene Hazel, MD;  Location: [facility] OR;  Service: Open Heart Surgery;  Laterality: N/A;    Family History  Adopted: Yes  Problem Relation Age of Onset   Healthy Son        x 2   Headache Other        Cluster headaches   Heart failure Mother    Colon cancer Neg Hx    Pancreatic cancer Neg Hx    Rectal cancer Neg Hx    Stomach cancer Neg Hx        Current Outpatient Medications:    amantadine (SYMMETREL) 50 MG/5ML solution, Take 5 mLs (50 mg total) by mouth 2 (two) times daily., Disp: 473 mL, Rfl: 5   amoxicillin (AMOXIL) 500 MG capsule, Take 2 capsules by mouth 1 hour before dentist or other procedures and 2 capsules by mouth after visit (Patient not taking: Reported on 10/17/2022), Disp: 4 capsule,  Rfl: 4   apixaban (ELIQUIS) 5 MG TABS tablet, Take 1 tablet (5 mg total) by mouth 2 (two) times daily., Disp: 180 tablet, Rfl: 3   b complex vitamins capsule, Take 1 capsule by mouth daily., Disp: , Rfl:    BD PEN NEEDLE NANO 2ND GEN 32G X 4 MM MISC, , Disp: , Rfl:    Continuous Blood Gluc Receiver (FREESTYLE LIBRE READER) DEVI, Use as directed, Disp: 1 each, Rfl: 0   Continuous Glucose Sensor (FREESTYLE LIBRE 3 SENSOR) MISC, Place one sensor every 14 days, Disp: 2 each, Rfl: 11   diltiazem (CARDIZEM CD) 120 MG 24 hr capsule, Take 1 capsule (120 mg total) by mouth daily., Disp: 90 capsule, Rfl: 3   escitalopram (LEXAPRO) 20 MG tablet, TAKE ONE TABLET BY MOUTH DAILY (Patient taking differently: Take 20 mg by mouth at bedtime.), Disp: 90 tablet, Rfl: 1   HYDROcodone-acetaminophen (NORCO/VICODIN) 5-325 MG tablet, Take 1 tablet by mouth every 4 (four) hours  as needed for moderate pain or severe pain., Disp: 30 tablet, Rfl: 0   insulin NPH Human (HUMULIN N) 100 UNIT/ML injection, Inject 20 units under the skin in the morning and 14 units in evening, Disp: 20 mL, Rfl: 5   Insulin NPH, Human,, Isophane, (HUMULIN N KWIKPEN) 100 UNIT/ML Kiwkpen, Inject 20 units into the skin every morning and 14 units every evening., Disp: 15 mL, Rfl: 6   Insulin Pen Needle (UNIFINE PENTIPS) 32G X 4 MM MISC, Inject 4 times subcutaneously, Disp: 400 each, Rfl: 2   insulin regular (HUMULIN R) 100 units/mL injection, Inject 10 Units total into the skin 3 (three) times daily before meals. (Patient taking differently: Inject 10 Units into the skin daily.), Disp: 10 mL, Rfl: 5   Insulin Syringe-Needle U-100 (ULTICARE INSULIN SYRINGE) 31G X 5/16" 1 ML MISC, Inject 4 times daily subcutaneously, Disp: 100 each, Rfl: 1   metFORMIN (GLUCOPHAGE-XR) 500 MG 24 hr tablet, Take 1 tablet by mouth 2 times daily, Disp: 180 tablet, Rfl: 2   Multiple Vitamin (MULITIVITAMIN WITH MINERALS) TABS, Take 1 tablet by mouth daily., Disp: , Rfl:    nitroGLYCERIN (NITROSTAT) 0.4 MG SL tablet, Place 1 tablet (0.4 mg total) under the tongue every 5 (five) minutes as needed for chest pain., Disp: 25 tablet, Rfl: 4   omeprazole (PRILOSEC) 20 MG capsule, Take 1 capsule (20 mg total) by mouth daily., Disp: 90 capsule, Rfl: 1   potassium chloride SA (KLOR-CON M) 20 MEQ tablet, Take 1 tablet (20 mEq total) by mouth daily. Take in concurrence with torsemide., Disp: 40 tablet, Rfl: 0   primidone (MYSOLINE) 50 MG tablet, Take 1 tablet (50 mg total) by mouth 2 (two) times daily., Disp: 60 tablet, Rfl: 3   SYNTHROID 137 MCG tablet, Take 1 tablet (137 mcg total) by mouth every morning on empty stomach., Disp: 90 tablet, Rfl: 1   tiZANidine (ZANAFLEX) 2 MG tablet, Take 1 tablet (2 mg total) by mouth 2 (two) times daily as needed., Disp: 60 tablet, Rfl: 0   torsemide (DEMADEX) 20 MG tablet, Take 2 tablets (40 mg total) by  mouth daily., Disp: 60 tablet, Rfl: 6   Vibegron (GEMTESA) 75 MG TABS, Take 75 mg by mouth at bedtime., Disp: , Rfl:   EXAM:  VITALS per patient if applicable:  GENERAL: alert, oriented, appears well and in no acute distress  HEENT: atraumatic, conjunttiva clear, no obvious abnormalities on inspection of external nose and ears  NECK: normal movements of the head and  neck  LUNGS: on inspection no signs of respiratory distress, breathing rate appears normal, no obvious gross SOB, gasping or wheezing  CV: no obvious cyanosis  MS: has loss of ROM  PSYCH/NEURO: pleasant and cooperative, no obvious depression or anxiety, speech and thought processing grossly intact  ASSESSMENT AND PLAN:  Discussed the following assessment and plan:  1. Anxiety and depression -We will further taper her Lexapro for the next 2 weeks, tapering instructions were given to her husband who was on the call today.  Once she is off the Lexapro she can start Cymbalta and will have her follow-up in 1 month. - DULoxetine (CYMBALTA) 30 MG capsule; Take 1 capsule (30 mg total) by mouth daily.  Dispense: 30 capsule; Refill: 0  2. Chronic pain syndrome  - DULoxetine (CYMBALTA) 30 MG capsule; Take 1 capsule (30 mg total) by mouth daily.  Dispense: 30 capsule; Refill: 0      I discussed the assessment and treatment plan with the patient. The patient was provided an opportunity to ask questions and all were answered. The patient agreed with the plan and demonstrated an understanding of the instructions.   The patient was advised to call back or seek an in-person evaluation if the symptoms worsen or if the condition fails to improve as anticipated.   Shirline Frees, NP   Time spent with patient today was 34 minutes which consisted of chart review, discussing anxiety/depression and chronic pain, work up, treatment answering questions and documentation.

## 2022-11-13 NOTE — Progress Notes (Signed)
   Subjective:    Patient ID: Breanna Webster, female    DOB: 24-Feb-1946, 77 y.o.   MRN: [id number]  HPI   Review of Systems     Objective:   Physical Exam        Assessment & Plan:

## 2022-11-14 ENCOUNTER — Other Ambulatory Visit (HOSPITAL_BASED_OUTPATIENT_CLINIC_OR_DEPARTMENT_OTHER): Payer: Self-pay

## 2022-11-14 DIAGNOSIS — M25512 Pain in left shoulder: Secondary | ICD-10-CM | POA: Diagnosis not present

## 2022-11-15 ENCOUNTER — Other Ambulatory Visit (HOSPITAL_BASED_OUTPATIENT_CLINIC_OR_DEPARTMENT_OTHER): Payer: Self-pay

## 2022-11-15 DIAGNOSIS — I10 Essential (primary) hypertension: Secondary | ICD-10-CM | POA: Diagnosis not present

## 2022-11-15 DIAGNOSIS — G8929 Other chronic pain: Secondary | ICD-10-CM | POA: Diagnosis not present

## 2022-11-15 DIAGNOSIS — E782 Mixed hyperlipidemia: Secondary | ICD-10-CM | POA: Diagnosis not present

## 2022-11-15 DIAGNOSIS — E1165 Type 2 diabetes mellitus with hyperglycemia: Secondary | ICD-10-CM | POA: Diagnosis not present

## 2022-11-15 DIAGNOSIS — E039 Hypothyroidism, unspecified: Secondary | ICD-10-CM | POA: Diagnosis not present

## 2022-11-15 DIAGNOSIS — I2510 Atherosclerotic heart disease of native coronary artery without angina pectoris: Secondary | ICD-10-CM | POA: Diagnosis not present

## 2022-11-15 DIAGNOSIS — I639 Cerebral infarction, unspecified: Secondary | ICD-10-CM | POA: Diagnosis not present

## 2022-11-16 DIAGNOSIS — M25512 Pain in left shoulder: Secondary | ICD-10-CM | POA: Diagnosis not present

## 2022-11-19 ENCOUNTER — Other Ambulatory Visit (HOSPITAL_BASED_OUTPATIENT_CLINIC_OR_DEPARTMENT_OTHER): Payer: Self-pay

## 2022-11-21 DIAGNOSIS — M25512 Pain in left shoulder: Secondary | ICD-10-CM | POA: Diagnosis not present

## 2022-11-23 ENCOUNTER — Other Ambulatory Visit (HOSPITAL_BASED_OUTPATIENT_CLINIC_OR_DEPARTMENT_OTHER): Payer: Self-pay

## 2022-11-23 ENCOUNTER — Encounter: Payer: Self-pay | Admitting: Hematology

## 2022-11-23 DIAGNOSIS — M25561 Pain in right knee: Secondary | ICD-10-CM | POA: Diagnosis not present

## 2022-11-23 DIAGNOSIS — G894 Chronic pain syndrome: Secondary | ICD-10-CM | POA: Diagnosis not present

## 2022-11-23 DIAGNOSIS — Z79899 Other long term (current) drug therapy: Secondary | ICD-10-CM | POA: Diagnosis not present

## 2022-11-23 DIAGNOSIS — M25512 Pain in left shoulder: Secondary | ICD-10-CM | POA: Diagnosis not present

## 2022-11-23 MED ORDER — NALOXONE HCL 4 MG/0.1ML NA LIQD
4.0000 mg | Freq: Once | NASAL | 0 refills | Status: AC
  Filled 2022-11-23: qty 2, 1d supply, fill #0

## 2022-11-23 MED ORDER — OXYCODONE HCL 5 MG PO TABS
5.0000 mg | ORAL_TABLET | Freq: Two times a day (BID) | ORAL | 0 refills | Status: DC
  Filled 2022-11-23: qty 20, 10d supply, fill #0

## 2022-11-26 ENCOUNTER — Other Ambulatory Visit (HOSPITAL_BASED_OUTPATIENT_CLINIC_OR_DEPARTMENT_OTHER): Payer: Self-pay

## 2022-11-27 DIAGNOSIS — M25512 Pain in left shoulder: Secondary | ICD-10-CM | POA: Diagnosis not present

## 2022-11-28 ENCOUNTER — Other Ambulatory Visit (HOSPITAL_BASED_OUTPATIENT_CLINIC_OR_DEPARTMENT_OTHER): Payer: Self-pay

## 2022-11-30 ENCOUNTER — Other Ambulatory Visit (HOSPITAL_BASED_OUTPATIENT_CLINIC_OR_DEPARTMENT_OTHER): Payer: Self-pay

## 2022-11-30 DIAGNOSIS — R3915 Urgency of urination: Secondary | ICD-10-CM | POA: Diagnosis not present

## 2022-11-30 DIAGNOSIS — R350 Frequency of micturition: Secondary | ICD-10-CM | POA: Diagnosis not present

## 2022-12-01 ENCOUNTER — Other Ambulatory Visit (HOSPITAL_BASED_OUTPATIENT_CLINIC_OR_DEPARTMENT_OTHER): Payer: Self-pay

## 2022-12-01 ENCOUNTER — Other Ambulatory Visit: Payer: Self-pay | Admitting: Adult Health

## 2022-12-02 ENCOUNTER — Other Ambulatory Visit (HOSPITAL_BASED_OUTPATIENT_CLINIC_OR_DEPARTMENT_OTHER): Payer: Self-pay

## 2022-12-03 ENCOUNTER — Other Ambulatory Visit: Payer: Self-pay | Admitting: Adult Health

## 2022-12-03 ENCOUNTER — Other Ambulatory Visit (HOSPITAL_BASED_OUTPATIENT_CLINIC_OR_DEPARTMENT_OTHER): Payer: Self-pay

## 2022-12-03 ENCOUNTER — Other Ambulatory Visit: Payer: Self-pay | Admitting: Gastroenterology

## 2022-12-03 MED ORDER — OXYCODONE HCL 5 MG PO TABS
5.0000 mg | ORAL_TABLET | Freq: Two times a day (BID) | ORAL | 0 refills | Status: DC
  Filled 2022-12-03: qty 60, 30d supply, fill #0

## 2022-12-03 MED ORDER — OMEPRAZOLE 20 MG PO CPDR
20.0000 mg | DELAYED_RELEASE_CAPSULE | Freq: Every day | ORAL | 0 refills | Status: DC
  Filled 2022-12-13: qty 90, 90d supply, fill #0

## 2022-12-04 ENCOUNTER — Encounter: Payer: Self-pay | Admitting: Hematology

## 2022-12-04 ENCOUNTER — Other Ambulatory Visit (HOSPITAL_BASED_OUTPATIENT_CLINIC_OR_DEPARTMENT_OTHER): Payer: Self-pay

## 2022-12-04 MED ORDER — "INSULIN SYRINGE-NEEDLE U-100 31G X 5/16"" 1 ML MISC"
0 refills | Status: DC
  Filled 2022-12-04: qty 100, 25d supply, fill #0
  Filled 2022-12-25: qty 100, 50d supply, fill #0
  Filled 2023-03-02: qty 100, 25d supply, fill #0
  Filled 2023-03-11: qty 100, 25d supply, fill #1

## 2022-12-06 ENCOUNTER — Other Ambulatory Visit (HOSPITAL_BASED_OUTPATIENT_CLINIC_OR_DEPARTMENT_OTHER): Payer: Self-pay

## 2022-12-06 DIAGNOSIS — M25512 Pain in left shoulder: Secondary | ICD-10-CM | POA: Diagnosis not present

## 2022-12-07 ENCOUNTER — Other Ambulatory Visit (HOSPITAL_BASED_OUTPATIENT_CLINIC_OR_DEPARTMENT_OTHER): Payer: Self-pay

## 2022-12-09 ENCOUNTER — Other Ambulatory Visit: Payer: Self-pay

## 2022-12-09 ENCOUNTER — Emergency Department (HOSPITAL_COMMUNITY)
Admission: EM | Admit: 2022-12-09 | Discharge: 2022-12-10 | Disposition: A | Discharge status: Home / Self Care | Payer: Medicare Other | Attending: Emergency Medicine | Admitting: Emergency Medicine

## 2022-12-09 DIAGNOSIS — M109 Gout, unspecified: Secondary | ICD-10-CM

## 2022-12-09 DIAGNOSIS — M79675 Pain in left toe(s): Secondary | ICD-10-CM | POA: Diagnosis not present

## 2022-12-09 DIAGNOSIS — M2012 Hallux valgus (acquired), left foot: Secondary | ICD-10-CM | POA: Diagnosis not present

## 2022-12-09 DIAGNOSIS — M79673 Pain in unspecified foot: Secondary | ICD-10-CM | POA: Diagnosis not present

## 2022-12-09 DIAGNOSIS — M7989 Other specified soft tissue disorders: Secondary | ICD-10-CM | POA: Diagnosis not present

## 2022-12-09 DIAGNOSIS — M19072 Primary osteoarthritis, left ankle and foot: Secondary | ICD-10-CM | POA: Diagnosis not present

## 2022-12-09 DIAGNOSIS — M79672 Pain in left foot: Secondary | ICD-10-CM | POA: Diagnosis not present

## 2022-12-09 NOTE — ED Triage Notes (Signed)
Pt. Arrives POV c/o L. Foot pain. Pt. Is unable to apply pressure to the foot. Is prescribed oxycodone, but has not taken any since 5pm.

## 2022-12-10 ENCOUNTER — Other Ambulatory Visit: Payer: Self-pay

## 2022-12-10 ENCOUNTER — Emergency Department (HOSPITAL_COMMUNITY): Payer: Medicare Other

## 2022-12-10 ENCOUNTER — Encounter (HOSPITAL_COMMUNITY): Payer: Self-pay | Admitting: Emergency Medicine

## 2022-12-10 ENCOUNTER — Other Ambulatory Visit (HOSPITAL_BASED_OUTPATIENT_CLINIC_OR_DEPARTMENT_OTHER): Payer: Self-pay

## 2022-12-10 DIAGNOSIS — M79675 Pain in left toe(s): Secondary | ICD-10-CM | POA: Diagnosis not present

## 2022-12-10 DIAGNOSIS — M79672 Pain in left foot: Secondary | ICD-10-CM | POA: Diagnosis not present

## 2022-12-10 DIAGNOSIS — M2012 Hallux valgus (acquired), left foot: Secondary | ICD-10-CM | POA: Diagnosis not present

## 2022-12-10 DIAGNOSIS — M7989 Other specified soft tissue disorders: Secondary | ICD-10-CM | POA: Diagnosis not present

## 2022-12-10 DIAGNOSIS — M19072 Primary osteoarthritis, left ankle and foot: Secondary | ICD-10-CM | POA: Diagnosis not present

## 2022-12-10 MED ORDER — OXYCODONE HCL 5 MG PO TABS
10.0000 mg | ORAL_TABLET | Freq: Once | ORAL | Status: AC
  Administered 2022-12-10: 10 mg via ORAL
  Filled 2022-12-10: qty 2

## 2022-12-10 MED ORDER — COLCHICINE 0.6 MG PO TABS
1.2000 mg | ORAL_TABLET | Freq: Once | ORAL | Status: AC
  Administered 2022-12-10: 1.2 mg via ORAL
  Filled 2022-12-10: qty 2

## 2022-12-10 MED ORDER — COLCHICINE 0.6 MG PO TABS
0.6000 mg | ORAL_TABLET | Freq: Every day | ORAL | 1 refills | Status: DC
  Filled 2022-12-10: qty 5, 5d supply, fill #0
  Filled 2023-01-17: qty 5, 5d supply, fill #1

## 2022-12-10 MED ORDER — HYDROMORPHONE HCL 1 MG/ML IJ SOLN
1.0000 mg | Freq: Once | INTRAMUSCULAR | Status: AC
  Administered 2022-12-10: 1 mg via INTRAMUSCULAR
  Filled 2022-12-10: qty 1

## 2022-12-10 NOTE — ED Notes (Signed)
.  attempt

## 2022-12-10 NOTE — ED Provider Notes (Signed)
[facility]-EMERGENCY DEPT [facility] Emergency Department Provider Note MRN:  [id number]  Arrival date & time: 12/10/22     Chief Complaint   Foot Pain   History of Present Illness   Breanna Webster is a 77 y.o. year-old female presents to the ED with chief complaint of left great toe and foot pain.  She states she has been having the symptoms for about a week.  She has had the same the past.  She states she has never been diagnosed with gout.  She denies fevers or chills.  She reports some swelling of the left foot.  Denies any injury.  She takes oxycodone at home, but states that is not helping.Marland Kitchen  History provided by patient.   Review of Systems  Pertinent positive and negative review of systems noted in HPI.    Physical Exam   Vitals:   12/10/22 0359 12/10/22 0400  BP:  124/77  Pulse:  86  Resp:  16  Temp: 98.4 F (36.9 C) 98.4 F (36.9 C)  SpO2:  97%    CONSTITUTIONAL:  non toxic-appearing, NAD NEURO:  Alert and oriented x 3, CN 3-12 grossly intact EYES:  eyes equal and reactive ENT/NECK:  Supple, no stridor  CARDIO:  normal rate, appears well-perfused  PULM:  No respiratory distress,  GI/GU:  non-distended,  MSK/SPINE:  No gross deformities, moves all extremities, mild swelling of the left foot, tenderness to palpation diffusely SKIN:  no rash, atraumatic, mild erythema about the left great toe   *Additional and/or pertinent findings included in MDM below  Diagnostic and Interventional Summary    EKG Interpretation Date/Time:    Ventricular Rate:    PR Interval:    QRS Duration:    QT Interval:    QTC Calculation:   R Axis:      Text Interpretation:         Labs Reviewed - No data to display  DG Foot Complete Left  Final Result      Medications  HYDROmorphone (DILAUDID) injection 1 mg (has no administration in time range)  colchicine tablet 1.2 mg (has no administration in time range)     Procedures  /  Critical Care Procedures  ED  Course and Medical Decision Making  I have reviewed the triage vital signs, the nursing notes, and pertinent available records from the EMR.  Social Determinants Affecting Complexity of Care: Patient has no clinically significant social determinants affecting this chief complaint..   ED Course:    Medical Decision Making Patient here with left great toe pain.  She has some erythema around the toe.  Denies any trauma or injury.  It does not look like cellulitis.  She does have some bony erosions which are consistent with inflammatory arthritis or gout.  Her symptoms seem consistent with gout.  I will treat her with colchicine.  Last creatinine was from 2 months ago was normal.  I have verified with pharmacy that patient can take a loading dose of colchicine with her Cardizem.  Amount and/or Complexity of Data Reviewed Radiology: ordered.  Risk Prescription drug management.         Consultants: No consultations were needed in caring for this patient.   Treatment and Plan: Emergency department workup does not suggest an emergent condition requiring admission or immediate intervention beyond  what has been performed at this time. The patient is safe for discharge and has  been instructed to return immediately for worsening symptoms, change in  symptoms or  any other concerns    Final Clinical Impressions(s) / ED Diagnoses     ICD-10-CM   1. Acute gout involving toe of left foot, unspecified cause  M10.9       ED Discharge Orders          Ordered    colchicine 0.6 MG tablet  Daily        12/10/22 0420              Discharge Instructions Discussed with and Provided to Patient:   Discharge Instructions   None      Roxy Horseman, PA-C 12/10/22 0420    Glynn Octave, MD 12/10/22 [phone]

## 2022-12-10 NOTE — ED Notes (Signed)
Pt taken to lobby to wait for Husband. Security aware. Patient aware and verbalized understanding. Stable condition

## 2022-12-10 NOTE — ED Notes (Signed)
Spoke with Husband, [name]. States he will be here for her in less than 1/2 hour. Patient taken to the bathroom, then assisted to wheelchair by tech and this RN.

## 2022-12-10 NOTE — ED Notes (Signed)
Pt up for discharge but husband is not answering the phone. Pt is dead weight when transferring. Will continue to try and contact pts husband for transport home.

## 2022-12-11 ENCOUNTER — Ambulatory Visit (INDEPENDENT_AMBULATORY_CARE_PROVIDER_SITE_OTHER): Payer: Medicare Other | Admitting: Adult Health

## 2022-12-11 ENCOUNTER — Encounter: Payer: Self-pay | Admitting: Adult Health

## 2022-12-11 VITALS — BP 120/80 | HR 80 | Temp 97.6°F

## 2022-12-11 DIAGNOSIS — M109 Gout, unspecified: Secondary | ICD-10-CM

## 2022-12-11 NOTE — Progress Notes (Signed)
Subjective:    Patient ID: Breanna Webster, female    DOB: December 31, 1945, 77 y.o.   MRN: [id number]  HPI  77 year old female who  has a past medical history of Anxiety, Arthritis, Atrial fibrillation with RVR (HCC) (10/21/2018), Back pain, Bacteremia due to Streptococcus Salivarius (10/07/2018), Blood transfusion without reported diagnosis, CAD (coronary artery disease), Carotid artery stenosis, Cerebellar stroke, acute (HCC) (10/21/2018), Cerebrovascular accident (CVA) due to embolism of right posterior cerebral artery (HCC) (08/03/2020), Chest pain, CHF (congestive heart failure) (HCC), Chronic lower back pain, Cirrhosis (HCC), Colon polyps, Diverticulitis, Diverticulosis, Esophageal thickening, GERD (gastroesophageal reflux disease), Grave's disease, H/O total shoulder replacement, left (06/01/2022), Heart murmur, History of cardioembolic stroke (40/98/1191), History of colonic polyps (05/22/2017), History of hiatal hernia, Hyperlipidemia, Hypertension, Hypothyroidism, IBS (irritable bowel syndrome), Osteopenia, Parkinson's syndrome (04/23/2020), Pulmonary nodules, S/P TAVR (transcatheter aortic valve replacement), Shortness of breath on exertion, Stroke (HCC) (2021), Thalassemia minor, Thyroid disease, and Type II diabetes mellitus (HCC).  She presents to the office today with her husband for follow-up after being seen in the emergency room 1 day ago with left great toe and foot pain.  She was diagnosed with gout after x-ray of the left foot and was placed on colchicine.  She took her first dose of colchicine and is due for her second dose.  This is her gout flare over her life, last was many years ago  Review of Systems See HPI   Past Medical History:  Diagnosis Date   Anxiety    Arthritis    "back" (04/22/2018)   Atrial fibrillation with RVR (HCC) 10/21/2018   Back pain    Bacteremia due to Streptococcus Salivarius 10/07/2018   Blood transfusion without reported diagnosis    CAD (coronary  artery disease)    a. 03/2018 s/p PCI/DES to the RCA (3.0x15 Onyx DES).   Carotid artery stenosis    Mild   Cerebellar stroke, acute (HCC) 10/21/2018   Cerebrovascular accident (CVA) due to embolism of right posterior cerebral artery (HCC) 08/03/2020   Chest pain    CHF (congestive heart failure) (HCC)    Chronic lower back pain    Cirrhosis (HCC)    Colon polyps    Diverticulitis    Diverticulosis    Esophageal thickening    seen on pre TAVR CT scan, also questionable cirrhosis. MRI recommended. Will refer to GI   GERD (gastroesophageal reflux disease)    Grave's disease    H/O total shoulder replacement, left 06/01/2022   Heart murmur    History of cardioembolic stroke 03/29/2020   History of colonic polyps 05/22/2017   History of hiatal hernia    Hyperlipidemia    Hypertension    Hypothyroidism    IBS (irritable bowel syndrome)    Osteopenia    Parkinson's syndrome 04/23/2020   Pulmonary nodules    seen on pre TAVR CT. likley benign. no follow up recommended if pt low risk.   S/P TAVR (transcatheter aortic valve replacement)    Shortness of breath on exertion    Stroke [facility]) 2021   x2   Thalassemia minor    Thyroid disease    Type II diabetes mellitus (HCC)     Social History   Socioeconomic History   Marital status: Married    Spouse name: Not on file   Number of children: 2   Years of education: Not on file   Highest education level: Associate degree: academic program  Occupational History   Occupation: Health and safety inspector of  the [employer]  Tobacco Use   Smoking status: Never    Passive exposure: Never   Smokeless tobacco: Never  Vaping Use   Vaping status: Never Used  Substance and Sexual Activity   Alcohol use: No    Alcohol/week: 0.0 standard drinks of alcohol   Drug use: Never   Sexual activity: Not Currently  Other Topics Concern   Not on file  Social History Narrative   Lives with husband in a one story home.     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   Education: college.   Social Determinants of Health   Financial Resource Strain: Patient Declined (12/09/2022)   Overall Financial Resource Strain (CARDIA)    Difficulty of Paying Living Expenses: Patient declined  Food Insecurity: No Food Insecurity (12/09/2022)   Hunger Vital Sign    Worried About Running Out of Food in the Last Year: Never true    Ran Out of Food in the Last Year: Never true  Transportation Needs: Unknown (12/09/2022)   PRAPARE - Transportation    Lack of Transportation (Medical): No    Lack of Transportation (Non-Medical): Patient declined  Physical Activity: Unknown (12/09/2022)   Exercise Vital Sign    Days of Exercise per Week: Patient declined    Minutes of Exercise per Session: 40 min  Stress: No Stress Concern Present (12/09/2022)   [employer] of Occupational Health - Occupational Stress Questionnaire    Feeling of Stress : Only a little  Social Connections: Unknown (12/09/2022)   Social Connection and Isolation Panel [NHANES]    Frequency of Communication with Friends and Family: More than three times a week    Frequency of Social Gatherings with Friends and Family: Patient declined    Attends Religious Services: Patient declined    Active Member of Clubs or Organizations: Patient declined    Attends Engineer, structural: More than 4 times per year    Marital Status: Married  Catering manager Violence: Not At Risk (10/16/2022)   Humiliation, Afraid, Rape, and Kick questionnaire    Fear of Current or Ex-Partner: No    Emotionally Abused: No    Physically Abused: No    Sexually Abused: No    Past Surgical History:  Procedure Laterality Date   67 HOUR PH STUDY N/A 03/03/2018   Procedure: 24 HOUR PH STUDY;  Surgeon: Napoleon Form, MD;  Location: [facility];  Service: Endoscopy;  Laterality: N/A;   COLONOSCOPY  2023   COLONOSCOPY W/ BIOPSIES  AND POLYPECTOMY     CORONARY ANGIOGRAPHY Right 04/21/2018   Procedure: CORONARY ANGIOGRAPHY (CATH LAB);  Surgeon: Lyn Records, MD;  Location: [facility] INVASIVE CV LAB;  Service: Cardiovascular;  Laterality: Right;   CORONARY STENT INTERVENTION N/A 04/22/2018   Procedure: CORONARY STENT INTERVENTION;  Surgeon: Lyn Records, MD;  Location: [facility] INVASIVE CV LAB;  Service: Cardiovascular;  Laterality: N/A;   DILATION AND CURETTAGE OF UTERUS     ESOPHAGEAL MANOMETRY N/A 03/03/2018   Procedure: ESOPHAGEAL MANOMETRY (EM);  Surgeon: Napoleon Form, MD;  Location: [facility];  Service: Endoscopy;  Laterality: N/A;   HYSTEROSCOPY     fibroids   LAPAROSCOPIC CHOLECYSTECTOMY  1985   LAPAROSCOPY     fibroids   NISSEN FUNDOPLICATION  1990s   POLYPECTOMY     REVERSE SHOULDER ARTHROPLASTY Left 06/01/2022   Procedure: REVERSE SHOULDER ARTHROPLASTY;  Surgeon: Beverely Low, MD;  Location:  [facility] ORS;  Service: Orthopedics;  Laterality: Left;  choice with interscalene block   RIGHT/LEFT HEART CATH AND CORONARY ANGIOGRAPHY N/A 02/20/2018   Procedure: RIGHT/LEFT HEART CATH AND CORONARY ANGIOGRAPHY;  Surgeon: Lyn Records, MD;  Location: [facility] INVASIVE CV LAB;  Service: Cardiovascular;  Laterality: N/A;   TEE WITHOUT CARDIOVERSION N/A 07/08/2018   Procedure: TRANSESOPHAGEAL ECHOCARDIOGRAM (TEE);  Surgeon: Kathleene Hazel, MD;  Location: [facility] OR;  Service: Open Heart Surgery;  Laterality: N/A;   TEE WITHOUT CARDIOVERSION  10/07/2018   TEE WITHOUT CARDIOVERSION N/A 10/07/2018   Procedure: TRANSESOPHAGEAL ECHOCARDIOGRAM (TEE);  Surgeon: Jake Bathe, MD;  Location: [facility] ENDOSCOPY;  Service: Cardiovascular;  Laterality: N/A;   TONSILLECTOMY     age 80   TRANSCATHETER AORTIC VALVE REPLACEMENT, TRANSFEMORAL N/A 07/08/2018   Procedure: TRANSCATHETER AORTIC VALVE REPLACEMENT, TRANSFEMORAL;  Surgeon: Kathleene Hazel, MD;  Location: [facility] OR;  Service: Open Heart Surgery;  Laterality: N/A;    Family History   Adopted: Yes  Problem Relation Age of Onset   Healthy Son        x 2   Headache Other        Cluster headaches   Heart failure Mother    Colon cancer Neg Hx    Pancreatic cancer Neg Hx    Rectal cancer Neg Hx    Stomach cancer Neg Hx     No Known Allergies  Current Outpatient Medications on File Prior to Visit  Medication Sig Dispense Refill   amantadine (SYMMETREL) 50 MG/5ML solution Take 5 mLs (50 mg total) by mouth 2 (two) times daily. 473 mL 5   amoxicillin (AMOXIL) 500 MG capsule Take 2 capsules by mouth 1 hour before dentist or other procedures and 2 capsules by mouth after visit 4 capsule 4   apixaban (ELIQUIS) 5 MG TABS tablet Take 1 tablet (5 mg total) by mouth 2 (two) times daily. 180 tablet 3   b complex vitamins capsule Take 1 capsule by mouth daily.     BD PEN NEEDLE NANO 2ND GEN 32G X 4 MM MISC      colchicine 0.6 MG tablet Take 1 tablet (0.6 mg total) by mouth daily. 5 tablet 1   Continuous Blood Gluc Receiver (FREESTYLE LIBRE READER) DEVI Use as directed 1 each 0   Continuous Glucose Sensor (FREESTYLE LIBRE 3 SENSOR) MISC Place one sensor every 14 days 2 each 11   diltiazem (CARDIZEM CD) 120 MG 24 hr capsule Take 1 capsule (120 mg total) by mouth daily. 90 capsule 3   DULoxetine (CYMBALTA) 30 MG capsule Take 1 capsule (30 mg total) by mouth daily. 30 capsule 0   HYDROcodone-acetaminophen (NORCO/VICODIN) 5-325 MG tablet Take 1 tablet by mouth every 4 (four) hours as needed for moderate pain or severe pain. 30 tablet 0   insulin NPH Human (HUMULIN N) 100 UNIT/ML injection Inject 20 units under the skin in the morning and 14 units in evening 20 mL 5   Insulin NPH, Human,, Isophane, (HUMULIN N KWIKPEN) 100 UNIT/ML Kiwkpen Inject 20 units into the skin every morning and 14 units every evening. 15 mL 6   Insulin Pen Needle (UNIFINE PENTIPS) 32G X 4 MM MISC Inject 4 times subcutaneously 400 each 2   insulin regular (HUMULIN R) 100 units/mL injection Inject 10 Units total  into the skin 3 (three) times daily before meals. (Patient taking differently: Inject 10 Units into the skin daily.) 10 mL 5   Insulin Syringe-Needle U-100 (ULTICARE INSULIN SYRINGE) 31G  X 5/16" 1 ML MISC Inject 4 times daily subcutaneously 100 each 1   metFORMIN (GLUCOPHAGE-XR) 500 MG 24 hr tablet Take 1 tablet by mouth 2 times daily 180 tablet 2   Multiple Vitamin (MULITIVITAMIN WITH MINERALS) TABS Take 1 tablet by mouth daily.     nitroGLYCERIN (NITROSTAT) 0.4 MG SL tablet Place 1 tablet (0.4 mg total) under the tongue every 5 (five) minutes as needed for chest pain. 25 tablet 4   omeprazole (PRILOSEC) 20 MG capsule Take 1 capsule (20 mg total) by mouth daily. Please keep your August appointment for further refills. Thank you 90 capsule 0   oxyCODONE (OXY IR/ROXICODONE) 5 MG immediate release tablet Take 1 tablet (5 mg total) by mouth 2 (two) times daily. 60 tablet 0   potassium chloride SA (KLOR-CON M) 20 MEQ tablet Take 1 tablet (20 mEq total) by mouth daily. Take in concurrence with torsemide. 40 tablet 0   primidone (MYSOLINE) 50 MG tablet Take 1 tablet (50 mg total) by mouth 2 (two) times daily. 60 tablet 3   SYNTHROID 137 MCG tablet Take 1 tablet (137 mcg total) by mouth every morning on empty stomach. 90 tablet 1   tiZANidine (ZANAFLEX) 2 MG tablet Take 1 tablet (2 mg total) by mouth 2 (two) times daily as needed. 60 tablet 0   torsemide (DEMADEX) 20 MG tablet Take 2 tablets (40 mg total) by mouth daily. 60 tablet 6   Vibegron (GEMTESA) 75 MG TABS Take 75 mg by mouth at bedtime.     No current facility-administered medications on file prior to visit.    BP 120/80   Pulse 80   Temp 97.6 F (36.4 C) (Oral)   SpO2 95%       Objective:   Physical Exam Vitals and nursing note reviewed.  Constitutional:      Appearance: Normal appearance.  Musculoskeletal:        General: Swelling and tenderness present. Normal range of motion.  Skin:    Findings: Erythema present.   Neurological:     General: No focal deficit present.     Mental Status: She is alert and oriented to person, place, and time.  Psychiatric:        Mood and Affect: Mood normal.        Behavior: Behavior normal.        Thought Content: Thought content normal.        Judgment: Judgment normal.        Assessment & Plan:  1. Acute gout involving toe of left foot, unspecified cause - Some improvement in the next 24 hours - Take colchicine as directed - Follow up if not resolved in the next 4 days   Shirline Frees, NP

## 2022-12-12 ENCOUNTER — Encounter: Payer: Self-pay | Admitting: Adult Health

## 2022-12-13 ENCOUNTER — Other Ambulatory Visit (HOSPITAL_BASED_OUTPATIENT_CLINIC_OR_DEPARTMENT_OTHER): Payer: Self-pay

## 2022-12-13 DIAGNOSIS — M25512 Pain in left shoulder: Secondary | ICD-10-CM | POA: Diagnosis not present

## 2022-12-18 ENCOUNTER — Other Ambulatory Visit (HOSPITAL_BASED_OUTPATIENT_CLINIC_OR_DEPARTMENT_OTHER): Payer: Self-pay

## 2022-12-18 ENCOUNTER — Telehealth: Payer: Self-pay

## 2022-12-18 DIAGNOSIS — M25512 Pain in left shoulder: Secondary | ICD-10-CM | POA: Diagnosis not present

## 2022-12-18 NOTE — Telephone Encounter (Addendum)
Transition Care Management Unsuccessful Follow-up Telephone Call  Date of discharge and from where:  12/10/2022 [facility]  Attempts:  1st Attempt  Reason for unsuccessful TCM follow-up call:  Left voice message  [facility]  [facility] Population Health Community Resource Care Guide   [email or web address]  ?? [phone]   Website: [email or web address]  [email or web address]

## 2022-12-19 ENCOUNTER — Encounter: Payer: Self-pay | Admitting: Gastroenterology

## 2022-12-19 ENCOUNTER — Ambulatory Visit (INDEPENDENT_AMBULATORY_CARE_PROVIDER_SITE_OTHER): Payer: Medicare Other | Admitting: Gastroenterology

## 2022-12-19 VITALS — BP 118/68 | HR 79 | Ht 65.0 in

## 2022-12-19 DIAGNOSIS — K7460 Unspecified cirrhosis of liver: Secondary | ICD-10-CM | POA: Diagnosis not present

## 2022-12-19 DIAGNOSIS — K5900 Constipation, unspecified: Secondary | ICD-10-CM

## 2022-12-19 DIAGNOSIS — K219 Gastro-esophageal reflux disease without esophagitis: Secondary | ICD-10-CM

## 2022-12-19 DIAGNOSIS — K7581 Nonalcoholic steatohepatitis (NASH): Secondary | ICD-10-CM | POA: Diagnosis not present

## 2022-12-19 MED ORDER — POLYETHYLENE GLYCOL 3350 17 G PO PACK: 17.0000 g | PACK | Freq: Every day | ORAL | Status: DC | PRN

## 2022-12-19 NOTE — Patient Instructions (Addendum)
If your blood pressure at your visit was 140/90 or greater, please contact your primary care physician to follow up on this. ______________________________________________________  If you are age 79 or older, your body mass index should be between 23-30. Your Body mass index is 30.07 kg/m. If this is out of the aforementioned range listed, please consider follow up with your Primary Care Provider.  If you are age 74 or younger, your body mass index should be between 19-25. Your Body mass index is 30.07 kg/m. If this is out of the aformentioned range listed, please consider follow up with your Primary Care Provider.  ________________________________________________________  The [facility] providers would like to encourage you to use [facility] to communicate with providers for non-urgent requests or questions.  Due to long hold times on the telephone, sending your provider a message by [facility] may be a faster and more efficient way to get a response.  Please allow 48 business hours for a response.  Please remember that this is for non-urgent requests.  _______________________________________________________  Due to recent changes in healthcare laws, you may see the results of your imaging and laboratory studies on MyChart before your provider has had a chance to review them.  We understand that in some cases there may be results that are confusing or concerning to you. Not all laboratory results come back in the same time frame and the provider may be waiting for multiple results in order to interpret others.  Please give [name] 48 hours in order for your provider to thoroughly review all the results before contacting the office for clarification of your results.    You will be due for a RUQ U/S in January.  Take Miralax daily and titrate as needed  Continue omeprazole.   Thank you for entrusting me with your care and for choosing [facility], Dr. Ileene Patrick

## 2022-12-19 NOTE — Progress Notes (Signed)
HPI :  77 year old female here for a follow-up visit for MASH cirrhosis, GERD with remote history of Barrett's, history of colon polyps.  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    We last saw her about 6 months ago.  She had a major shoulder surgery and clavicle fracture repair back in February.  She has had a tough time recovering from this.  She has significant pain in her shoulder that has been refractory to some interventions by pain management, they have placed her on some oxycodone for pain control.  She also has significant knee pain from arthritis.  She takes a few oxycodone per day for pain control, to the only way she can function.  She has had a slow recovery despite going to physical therapy etc.  She is not taking any NSAIDs.  Husband accompanies her at the visit today.  She appears to be tolerating the narcotics and has no symptoms of encephalopathy that they are concerned about.  She has not had any decompensating events otherwise.  No jaundice, no ascites.  She does have some lower extremity edema and is on torsemide daily.  She had an EGD back in November which showed no evidence of varices.  Her last right upper quadrant ultrasound was in July which showed no evidence of hepatomas or concerning pathology.  Her liver enzymes have been normal.  She takes Eliquis so we have not been tracking INR.  Her last EGD otherwise showed no evidence of Barrett's esophagus.  She takes omeprazole 20 mg daily, that works really well for her heartburn and controls her symptoms pretty well.  The other main issue she has been having lately has been some constipation from the MiraLAX.  She is not taking anything for her move her bowels currently.  Previously  she had loose stools at baseline.  Colonoscopy in November was performed for her history of numerous colon polyps and she had additional small polyps removed.  Otherwise her colon appeared healthy.  They are concerned the constipation is from narcotics.  Last bowel movement was 5 days ago.  She was just given her first dose of MiraLAX this morning by her husband   Recall she has had 42 polyps removed on her last 3 colonoscopies, most adenomas.   She does not drink any alcohol.  She denies any family history of cirrhosis.     Endoscopic history: EGD 02/03/19 - irregular zline, biopsies c/w nondysplastic BE, loose nissen, no varices, benign duodenal polyp Colonoscopy 02/03/19 - 8 small adenomas, diverticulosis, lipoma EGD 03/20/2017 - irregular z line, LA grade B esophagitis, small paraesophageal hernia, loose Nissen wrap, mild gastritis, duodenal erosions - path c/w Barrett's, H pylori negative  Colonoscopy 03/20/2017 - pancolonic diverticulosis, 28 polyps removed -  EGD 2002 - suspected segment of BE, no BE on biopsies EGD 2006 - esophagitis and gastritis, no evidence of BE Colonoscopy 5/05 - diverticulosis Colonoscopy 4/15 - 4 small adenomas  RUQ [name] 10/18/21: IMPRESSION: 1. Cirrhotic morphology to the liver with coarsened echotexture and a mildly nodular contour. 2. No other abnormalities.     Echo 03/21/21: EF 60-65%, mild LVH, grade I DD  EGD 03/07/2022 - normal esophagus without evidence of Barrett's esophagus and no evidence of esophageal or gastric varices.  Prior Nissan fundoplication  was intact.  A repeat EGD in a few years was considered.    Colonoscopy 03/07/22: 6 tubular adenomatous/hyperplastic polyps removed from the colon, diverticulosis throughout the colon and internal hemorrhoids.  No further colon polyp surveillance colonoscopies were recommended due to age.    1. Surgical [P], GE junction - SUPERFICIAL FRAGMENT OF GASTRIC CARDIAC MUCOSA WITH NO SIGNIFICANT PATHOLOGIC  CHANGES - NEGATIVE FOR INTESTINAL METAPLASIA, DYSPLASIA OR MALIGNANCY 2. Surgical [P], colon, transverse x 4 and cecum x 1, descending x 1, polyp (6) - TUBULAR ADENOMA(S). - NO HIGH GRADE DYSPLASIA OR MALIGNANCY. - HYPERPLASTIC POLYP(S). - NO DYSPLASIA OR MALIGNANCY. 3. Surgical [P], random sites colon - BENIGN COLONIC MUCOSA WITH NO SPECIFIC PATHOLOGIC CHANGES - NEGATIVE FOR INCREASED INTRAEPITHELIAL LYMPHOCYTES OR THICKENED SUBEPITHELIAL COLLAGEN TABLE - NEGATIVE FOR DYSPLASIA OR MALIGNANCY    RUQ [name] 10/26/22: IMPRESSION: Morphologic changes compatible with cirrhosis. No focal lesion.      Past Medical History:  Diagnosis Date   Anxiety    Arthritis    "back" (04/22/2018)   Atrial fibrillation with RVR (HCC) 10/21/2018   Back pain    Bacteremia due to Streptococcus Salivarius 10/07/2018   Blood transfusion without reported diagnosis    CAD (coronary artery disease)    a. 03/2018 s/p PCI/DES to the RCA (3.0x15 Onyx DES).   Carotid artery stenosis    Mild   Cerebellar stroke, acute (HCC) 10/21/2018   Cerebrovascular accident (CVA) due to embolism of right posterior cerebral artery (HCC) 08/03/2020   Chest pain    CHF (congestive heart failure) (HCC)    Chronic lower back pain    Cirrhosis (HCC)    Colon polyps    Diverticulitis    Diverticulosis    Esophageal thickening    seen on pre TAVR CT scan, also questionable cirrhosis. MRI recommended. Will refer to GI   GERD (gastroesophageal reflux disease)    Gout    Grave's disease    H/O total shoulder replacement, left 06/01/2022   Heart murmur    History of cardioembolic stroke 03/29/2020   History of colonic polyps 05/22/2017   History of hiatal hernia    Hyperlipidemia    Hypertension    Hypothyroidism    IBS (irritable bowel syndrome)    Osteopenia    Parkinson's syndrome 04/23/2020   Pulmonary nodules    seen on pre TAVR CT. likley benign. no follow up recommended if pt low risk.   S/P TAVR (transcatheter  aortic valve replacement)    Shortness of breath on exertion    Stroke (HCC) 2021   x2   Thalassemia minor    Thyroid disease    Type II diabetes mellitus (HCC)      Past Surgical History:  Procedure Laterality Date   39 HOUR PH STUDY N/A 03/03/2018   Procedure: 24 HOUR PH STUDY;  Surgeon: Napoleon Form, MD;  Location: [facility] END[facility];  Service: Endoscopy;  Laterality: N/A;   COLONOSCOPY  2023   COLONOSCOPY W/ BIOPSIES AND POLYPECTOMY     CORONARY ANGIOGRAPHY Right 04/21/2018   Procedure: CORONARY ANGIOGRAPHY (CATH LAB);  Surgeon: Lyn Records, MD;  Location: [facility] INVASIVE CV LAB;  Service: Cardiovascular;  Laterality: Right;   CORONARY STENT INTERVENTION N/A 04/22/2018   Procedure: CORONARY STENT INTERVENTION;  Surgeon: Lyn Records, MD;  Location: [facility] INVASIVE CV LAB;  Service: Cardiovascular;  Laterality: N/A;   DILATION AND CURETTAGE OF UTERUS     ESOPHAGEAL MANOMETRY N/A 03/03/2018   Procedure: ESOPHAGEAL MANOMETRY (EM);  Surgeon: Napoleon Form, MD;  Location: [facility];  Service: Endoscopy;  Laterality: N/A;   HYSTEROSCOPY     fibroids   LAPAROSCOPIC CHOLECYSTECTOMY  1985   LAPAROSCOPY     fibroids   NISSEN FUNDOPLICATION  1990s   POLYPECTOMY     REVERSE SHOULDER ARTHROPLASTY Left 06/01/2022   Procedure: REVERSE SHOULDER ARTHROPLASTY;  Surgeon: Beverely Low, MD;  Location: [facility] ORS;  Service: Orthopedics;  Laterality: Left;  choice with interscalene block   RIGHT/LEFT HEART CATH AND CORONARY ANGIOGRAPHY N/A 02/20/2018   Procedure: RIGHT/LEFT HEART CATH AND CORONARY ANGIOGRAPHY;  Surgeon: Lyn Records, MD;  Location: [facility] INVASIVE CV LAB;  Service: Cardiovascular;  Laterality: N/A;   TEE WITHOUT CARDIOVERSION N/A 07/08/2018   Procedure: TRANSESOPHAGEAL ECHOCARDIOGRAM (TEE);  Surgeon: Kathleene Hazel, MD;  Location: [facility] OR;  Service: Open Heart Surgery;  Laterality: N/A;   TEE WITHOUT CARDIOVERSION  10/07/2018   TEE WITHOUT CARDIOVERSION N/A 10/07/2018    Procedure: TRANSESOPHAGEAL ECHOCARDIOGRAM (TEE);  Surgeon: Jake Bathe, MD;  Location: [facility] ENDOSCOPY;  Service: Cardiovascular;  Laterality: N/A;   TONSILLECTOMY     age 53   TRANSCATHETER AORTIC VALVE REPLACEMENT, TRANSFEMORAL N/A 07/08/2018   Procedure: TRANSCATHETER AORTIC VALVE REPLACEMENT, TRANSFEMORAL;  Surgeon: Kathleene Hazel, MD;  Location: [facility] OR;  Service: Open Heart Surgery;  Laterality: N/A;   Family History  Adopted: Yes  Problem Relation Age of Onset   Healthy Son        x 2   Headache Other        Cluster headaches   Heart failure Mother    Colon cancer Neg Hx    Pancreatic cancer Neg Hx    Rectal cancer Neg Hx    Stomach cancer Neg Hx    Social History   Tobacco Use   Smoking status: Never    Passive exposure: Never   Smokeless tobacco: Never  Vaping Use   Vaping status: Never Used  Substance Use Topics   Alcohol use: No    Alcohol/week: 0.0 standard drinks of alcohol   Drug use: Never   Current Outpatient Medications  Medication Sig Dispense Refill   apixaban (ELIQUIS) 5 MG TABS tablet Take 1 tablet (5 mg total) by mouth 2 (two) times daily. 180 tablet 3   b complex vitamins capsule Take 1 capsule by mouth daily.     BD PEN NEEDLE NANO 2ND GEN 32G X 4 MM MISC      Continuous Blood Gluc Receiver (FREESTYLE LIBRE READER) DEVI Use as directed 1 each 0   Continuous Glucose Sensor (FREESTYLE LIBRE 3 SENSOR) MISC Place one sensor every 14 days 2 each 11   diltiazem (CARDIZEM CD) 120 MG 24 hr capsule Take 1 capsule (120 mg total) by mouth daily. 90 capsule 3   DULoxetine (CYMBALTA) 30 MG capsule Take 1 capsule (30 mg total) by mouth daily. 30 capsule 0   HYDROcodone-acetaminophen (NORCO/VICODIN) 5-325 MG tablet Take 1 tablet by mouth every 4 (four) hours as needed for moderate pain or severe pain. 30 tablet 0   insulin NPH Human (HUMULIN N) 100 UNIT/ML injection Inject 20 units under the skin in the morning and 14 units in evening 20 mL 5   Insulin NPH,  Human,, Isophane, (HUMULIN N KWIKPEN) 100 UNIT/ML Kiwkpen Inject 20 units into the skin every morning and 14 units every evening. 15 mL 6   Insulin Pen Needle (UNIFINE PENTIPS) 32G X 4 MM MISC Inject 4 times subcutaneously 400 each 2  insulin regular (HUMULIN R) 100 units/mL injection Inject 10 Units total into the skin 3 (three) times daily before meals. (Patient taking differently: Inject 10 Units into the skin daily.) 10 mL 5   metFORMIN (GLUCOPHAGE-XR) 500 MG 24 hr tablet Take 1 tablet by mouth 2 times daily 180 tablet 2   Multiple Vitamin (MULITIVITAMIN WITH MINERALS) TABS Take 1 tablet by mouth daily.     omeprazole (PRILOSEC) 20 MG capsule Take 1 capsule (20 mg total) by mouth daily. Please keep your August appointment for further refills. Thank you 90 capsule 0   oxyCODONE (OXY IR/ROXICODONE) 5 MG immediate release tablet Take 1 tablet (5 mg total) by mouth 2 (two) times daily. 60 tablet 0   potassium chloride SA (KLOR-CON M) 20 MEQ tablet Take 1 tablet (20 mEq total) by mouth daily. Take in concurrence with torsemide. 40 tablet 0   primidone (MYSOLINE) 50 MG tablet Take 1 tablet (50 mg total) by mouth 2 (two) times daily. 60 tablet 3   SYNTHROID 137 MCG tablet Take 1 tablet (137 mcg total) by mouth every morning on empty stomach. 90 tablet 1   torsemide (DEMADEX) 20 MG tablet Take 2 tablets (40 mg total) by mouth daily. 60 tablet 6   Vibegron (GEMTESA) 75 MG TABS Take 75 mg by mouth at bedtime.     amantadine (SYMMETREL) 50 MG/5ML solution Take 5 mLs (50 mg total) by mouth 2 (two) times daily. (Patient not taking: Reported on 12/19/2022) 473 mL 5   amoxicillin (AMOXIL) 500 MG capsule Take 2 capsules by mouth 1 hour before dentist or other procedures and 2 capsules by mouth after visit (Patient not taking: Reported on 12/19/2022) 4 capsule 4   colchicine 0.6 MG tablet Take 1 tablet (0.6 mg total) by mouth daily. (Patient not taking: Reported on 12/19/2022) 5 tablet 1   Insulin Syringe-Needle  U-100 (ULTICARE INSULIN SYRINGE) 31G X 5/16" 1 ML MISC Inject 4 times daily subcutaneously 100 each 1   nitroGLYCERIN (NITROSTAT) 0.4 MG SL tablet Place 1 tablet (0.4 mg total) under the tongue every 5 (five) minutes as needed for chest pain. (Patient not taking: Reported on 12/19/2022) 25 tablet 4   tiZANidine (ZANAFLEX) 2 MG tablet Take 1 tablet (2 mg total) by mouth 2 (two) times daily as needed. (Patient not taking: Reported on 12/19/2022) 60 tablet 0   No current facility-administered medications for this visit.   No Known Allergies   Review of Systems: All systems reviewed and negative except where noted in HPI.   Lab Results  Component Value Date   WBC 6.5 10/17/2022   HGB 9.8 (L) 10/17/2022   HCT 31.6 (L) 10/17/2022   MCV 63.2 (L) 10/17/2022   PLT 149 (L) 10/17/2022    Lab Results  Component Value Date   NA 136 10/17/2022   CL 105 10/17/2022   K 3.9 10/17/2022   CO2 23 10/17/2022   BUN 13 10/17/2022   CREATININE 0.80 10/17/2022   GFRNONAA >60 10/17/2022   CALCIUM 9.7 10/17/2022   ALBUMIN 3.4 (L) 10/17/2022   GLUCOSE 126 (H) 10/17/2022    Lab Results  Component Value Date   ALT 20 10/17/2022   AST 20 10/17/2022   ALKPHOS 77 10/17/2022   BILITOT 0.6 10/17/2022    Lab Results  Component Value Date   INR 1.6 (H) 12/27/2020   INR 1.8 (H) 09/08/2020   INR 1.2 04/19/2020      Physical Exam: BP 118/68   Pulse 79  Ht 5\' 5"  (1.651 m)   BMI 30.07 kg/m  Constitutional: Pleasant,female in no acute distress, in wheelchair Neurological: Alert and oriented to person place and time. Psychiatric: Normal mood and affect. Behavior is normal.   ASSESSMENT: 77 y.o. female here for assessment of the following  1. Liver cirrhosis secondary to NASH (HCC)   2. Gastroesophageal reflux disease, unspecified whether esophagitis present   3. Constipation, unspecified constipation type    Compensated cirrhosis secondary to [facility].  EGD is up-to-date, no varices, she is not  having any issues with ascites, encephalopathy, or jaundice.  Her main issues appear to be orthopedic/pain related currently, on narcotics for the past several months.  We discussed narcotic use in general, husband has reservations about using this chronically.  Ideally as she recovers from her operation and hopefully pain becomes more controlled over time, they can wean off the narcotics to a different regimen.  Currently it sounds like she needs them to function.  We discussed that we generally try to avoid chronic narcotics in the setting of cirrhosis, with patients who are at risk for encephalopathy, she has no history of hepatic encephalopathy so they will be very mindful of her mental status.  Otherwise we will continue to check her labs and right upper quadrant ultrasound every 6 months for surveillance purposes.  She is next due for these in January.  If any decompensating events which were reviewed, or issues in the interim they will contact me.  Otherwise narcotics appear to be causing some constipation.  We discussed risks for impaction etc. and she needs to be on a daily bowel regimen if she is on narcotics.  Recommend starting MiraLAX daily to twice daily and titrate up or down as needed.  If this is not successful he should let me know to discuss other options.  Otherwise reviewed her last EGD and colonoscopy with her.  No varices.  No evidence of Barrett's.  Continue omeprazole she needs this to control her heartburn.  Likely no further colonoscopy exams given her age and comorbidities.  PLAN: - continue current medication regimen - recall RUQ [name] and labs in January - CBC, CMET, AFP - start Miralax daily and titrate up as needed - continue omeprazole - no BE on last exam - try to minimize narcotic use and wean over time as she recovers from her surgery - no procedures planned for now, likely no further colonoscopy surveillance recommended. Consider EGD in upcoming few years based on her  course. - follow up in the office in 6 months or sooner with issues.  Harlin Rain, MD [facility]

## 2022-12-21 ENCOUNTER — Telehealth: Payer: Self-pay

## 2022-12-21 ENCOUNTER — Encounter: Payer: Self-pay | Admitting: Adult Health

## 2022-12-21 ENCOUNTER — Other Ambulatory Visit (HOSPITAL_COMMUNITY): Payer: Self-pay

## 2022-12-21 ENCOUNTER — Other Ambulatory Visit (HOSPITAL_BASED_OUTPATIENT_CLINIC_OR_DEPARTMENT_OTHER): Payer: Self-pay

## 2022-12-21 DIAGNOSIS — M25512 Pain in left shoulder: Secondary | ICD-10-CM | POA: Diagnosis not present

## 2022-12-21 NOTE — Telephone Encounter (Signed)
Transition Care Management Follow-up Telephone Call Date of discharge and from where: 12/10/2022 [facility] How have you been since you were released from the hospital? Patient stated that her gout symptoms have cleared up but she is feeling about the same. Any questions or concerns? No  Items Reviewed: Did the pt receive and understand the discharge instructions provided? Yes  Medications obtained and verified? Yes  Other? No  Any new allergies since your discharge? No  Dietary orders reviewed? Yes Do you have support at home? Yes   Follow up appointments reviewed:  PCP Hospital f/u appt confirmed? Yes  Scheduled to see Shirline Frees, NP on 12/11/2022 @ [facility]. Specialist Hospital f/u appt confirmed? Yes  Scheduled to see Ileene Patrick, MD on 12/19/2022 @ [facility] Gastroenterology. Are transportation arrangements needed? No  If their condition worsens, is the pt aware to call PCP or go to the Emergency Dept.? Yes Was the patient provided with contact information for the PCP's office or ED? Yes Was to pt encouraged to call back with questions or concerns? Yes  Breanna Webster [facility]  [facility] Population Health Community Resource Care Guide   ??[name]  ?? [phone]   Website: [email or web address]  [email or web address]

## 2022-12-25 ENCOUNTER — Other Ambulatory Visit: Payer: Self-pay | Admitting: Adult Health

## 2022-12-25 ENCOUNTER — Other Ambulatory Visit (HOSPITAL_BASED_OUTPATIENT_CLINIC_OR_DEPARTMENT_OTHER): Payer: Self-pay

## 2022-12-25 DIAGNOSIS — F419 Anxiety disorder, unspecified: Secondary | ICD-10-CM

## 2022-12-25 DIAGNOSIS — G894 Chronic pain syndrome: Secondary | ICD-10-CM

## 2022-12-25 NOTE — Telephone Encounter (Signed)
Please advise 

## 2022-12-26 ENCOUNTER — Encounter: Payer: Self-pay | Admitting: Hematology

## 2022-12-26 ENCOUNTER — Other Ambulatory Visit (HOSPITAL_BASED_OUTPATIENT_CLINIC_OR_DEPARTMENT_OTHER): Payer: Self-pay

## 2022-12-26 ENCOUNTER — Other Ambulatory Visit: Payer: Self-pay

## 2022-12-26 DIAGNOSIS — M25512 Pain in left shoulder: Secondary | ICD-10-CM | POA: Diagnosis not present

## 2022-12-26 MED ORDER — DULOXETINE HCL 30 MG PO CPEP
30.0000 mg | ORAL_CAPSULE | Freq: Every day | ORAL | 0 refills | Status: DC
  Filled 2022-12-26: qty 30, 30d supply, fill #0

## 2022-12-27 ENCOUNTER — Other Ambulatory Visit (HOSPITAL_BASED_OUTPATIENT_CLINIC_OR_DEPARTMENT_OTHER): Payer: Self-pay

## 2022-12-28 ENCOUNTER — Other Ambulatory Visit (HOSPITAL_BASED_OUTPATIENT_CLINIC_OR_DEPARTMENT_OTHER): Payer: Self-pay

## 2022-12-28 DIAGNOSIS — Z79899 Other long term (current) drug therapy: Secondary | ICD-10-CM | POA: Diagnosis not present

## 2022-12-28 DIAGNOSIS — M25561 Pain in right knee: Secondary | ICD-10-CM | POA: Diagnosis not present

## 2022-12-28 DIAGNOSIS — G894 Chronic pain syndrome: Secondary | ICD-10-CM | POA: Diagnosis not present

## 2022-12-28 DIAGNOSIS — R3915 Urgency of urination: Secondary | ICD-10-CM | POA: Diagnosis not present

## 2022-12-28 DIAGNOSIS — M25512 Pain in left shoulder: Secondary | ICD-10-CM | POA: Diagnosis not present

## 2022-12-28 DIAGNOSIS — R350 Frequency of micturition: Secondary | ICD-10-CM | POA: Diagnosis not present

## 2022-12-28 MED ORDER — OXYCODONE HCL 5 MG PO TABS
10.0000 mg | ORAL_TABLET | Freq: Two times a day (BID) | ORAL | 0 refills | Status: AC | PRN
  Filled 2022-12-28: qty 120, 30d supply, fill #0

## 2023-01-01 DIAGNOSIS — M25512 Pain in left shoulder: Secondary | ICD-10-CM | POA: Diagnosis not present

## 2023-01-03 DIAGNOSIS — M25512 Pain in left shoulder: Secondary | ICD-10-CM | POA: Diagnosis not present

## 2023-01-05 ENCOUNTER — Other Ambulatory Visit: Payer: Self-pay | Admitting: Adult Health

## 2023-01-05 ENCOUNTER — Other Ambulatory Visit (HOSPITAL_BASED_OUTPATIENT_CLINIC_OR_DEPARTMENT_OTHER): Payer: Self-pay

## 2023-01-07 ENCOUNTER — Other Ambulatory Visit (HOSPITAL_BASED_OUTPATIENT_CLINIC_OR_DEPARTMENT_OTHER): Payer: Self-pay

## 2023-01-07 ENCOUNTER — Other Ambulatory Visit: Payer: Self-pay | Admitting: Adult Health

## 2023-01-07 MED ORDER — HUMULIN N KWIKPEN 100 UNIT/ML ~~LOC~~ SUPN
PEN_INJECTOR | SUBCUTANEOUS | 3 refills | Status: DC
  Filled 2023-01-07: qty 15, 30d supply, fill #0
  Filled 2023-02-11: qty 15, 44d supply, fill #0

## 2023-01-08 NOTE — Telephone Encounter (Signed)
Okay for refill?  

## 2023-01-09 ENCOUNTER — Other Ambulatory Visit (HOSPITAL_BASED_OUTPATIENT_CLINIC_OR_DEPARTMENT_OTHER): Payer: Self-pay

## 2023-01-09 ENCOUNTER — Telehealth: Payer: Self-pay | Admitting: Adult Health

## 2023-01-09 ENCOUNTER — Telehealth: Payer: Self-pay | Admitting: General Practice

## 2023-01-09 DIAGNOSIS — S42032A Displaced fracture of lateral end of left clavicle, initial encounter for closed fracture: Secondary | ICD-10-CM | POA: Diagnosis not present

## 2023-01-09 DIAGNOSIS — I480 Paroxysmal atrial fibrillation: Secondary | ICD-10-CM

## 2023-01-09 DIAGNOSIS — Z96612 Presence of left artificial shoulder joint: Secondary | ICD-10-CM | POA: Diagnosis not present

## 2023-01-09 DIAGNOSIS — M25561 Pain in right knee: Secondary | ICD-10-CM | POA: Diagnosis not present

## 2023-01-09 MED ORDER — INSULIN REGULAR HUMAN 100 UNIT/ML IJ SOLN
10.0000 [IU] | Freq: Three times a day (TID) | INTRAMUSCULAR | 3 refills | Status: DC
  Filled 2023-01-09: qty 10, 34d supply, fill #0
  Filled 2023-02-11: qty 10, 34d supply, fill #1
  Filled 2023-03-28: qty 10, 34d supply, fill #2

## 2023-01-09 MED ORDER — TIZANIDINE HCL 4 MG PO TABS
4.0000 mg | ORAL_TABLET | Freq: Three times a day (TID) | ORAL | 1 refills | Status: DC
  Filled 2023-01-09 (×2): qty 90, 30d supply, fill #0
  Filled 2023-02-11 (×2): qty 90, 30d supply, fill #1

## 2023-01-09 NOTE — Telephone Encounter (Signed)
Pharmacy states Xarelto will be covered by a lower copay and patient ask if she can change. She spoke with pharmacist today and unsure if they discussed patient assistance, just discussed with her the Xarelto and she (patient) asked to switch to Xarelto

## 2023-01-09 NOTE — Telephone Encounter (Signed)
Pharmacy called... [facility] Phone: [phone]  Fax: [phone]     Would like to change Rx   From Eliquis to Xarelto

## 2023-01-09 NOTE — Telephone Encounter (Signed)
Pt c/o medication issue:  1. Name of Medication:   apixaban (ELIQUIS) 5 MG TABS tablet    2. How are you currently taking this medication (dosage and times per day)? Take 1 tablet (5 mg total) by mouth 2 (two) times daily.   3. Are you having a reaction (difficulty breathing--STAT)? No  4. What is your medication issue? Pharmacy states that pt would like to be switched from above medication to Xarelto due to insurance purposes. Please advise

## 2023-01-10 ENCOUNTER — Telehealth: Payer: Self-pay | Admitting: General Practice

## 2023-01-10 ENCOUNTER — Other Ambulatory Visit (HOSPITAL_BASED_OUTPATIENT_CLINIC_OR_DEPARTMENT_OTHER): Payer: Self-pay

## 2023-01-10 DIAGNOSIS — M1711 Unilateral primary osteoarthritis, right knee: Secondary | ICD-10-CM | POA: Diagnosis not present

## 2023-01-10 DIAGNOSIS — M25512 Pain in left shoulder: Secondary | ICD-10-CM | POA: Diagnosis not present

## 2023-01-10 MED ORDER — RIVAROXABAN 20 MG PO TABS
20.0000 mg | ORAL_TABLET | Freq: Every day | ORAL | 5 refills | Status: DC
  Filled 2023-01-10: qty 30, 30d supply, fill #0

## 2023-01-10 NOTE — Telephone Encounter (Signed)
Pt spouse was advised this needs to come from Cardiology.

## 2023-01-10 NOTE — Telephone Encounter (Signed)
Call to pharmacist Maureen Ralphs.  She states the patient has Medicare and unsure if any patient assistance can be used.  Through her medicare the xarelto is $87 a month rather than the Eliquis at $139.  She states she can be called with any questions

## 2023-01-10 NOTE — Telephone Encounter (Signed)
Yes, that is fine to switch. I assume she is using the Xarelto and Me (formerly [employer]) program and needs Rx to [facility]. Please confirm and we can send.

## 2023-01-10 NOTE — Telephone Encounter (Signed)
Paper Work Dropped Off: Medication approval  Date:01/09/2026  Location of paper: [place] mailbox  Patient's husband dropped off paperwork and stated the patient (wife) medication was approved but were having trouble getting it from their pharmacy at [facility].

## 2023-01-11 ENCOUNTER — Other Ambulatory Visit (HOSPITAL_BASED_OUTPATIENT_CLINIC_OR_DEPARTMENT_OTHER): Payer: Self-pay

## 2023-01-11 ENCOUNTER — Other Ambulatory Visit: Payer: Self-pay | Admitting: Adult Health

## 2023-01-11 ENCOUNTER — Encounter: Payer: Self-pay | Admitting: Adult Health

## 2023-01-11 MED ORDER — DULOXETINE HCL 60 MG PO CPEP
60.0000 mg | ORAL_CAPSULE | Freq: Every day | ORAL | 1 refills | Status: DC
  Filled 2023-01-11: qty 30, 30d supply, fill #0

## 2023-01-11 NOTE — Telephone Encounter (Signed)
Please advise 

## 2023-01-14 ENCOUNTER — Telehealth: Payer: Self-pay | Admitting: Neurology

## 2023-01-14 ENCOUNTER — Other Ambulatory Visit (HOSPITAL_BASED_OUTPATIENT_CLINIC_OR_DEPARTMENT_OTHER): Payer: Self-pay

## 2023-01-14 NOTE — Telephone Encounter (Signed)
Pt is asking for a call to discuss headaches she has had for over a month(all over, not in one certain part of her head), pt unsure if this is as a result of her diagnosis as to what she see's Dr Vickey Huger about or not.  Pt did agree to allow phone rep to send a message asking for a call from RN to discuss further.

## 2023-01-15 ENCOUNTER — Other Ambulatory Visit (HOSPITAL_BASED_OUTPATIENT_CLINIC_OR_DEPARTMENT_OTHER): Payer: Self-pay

## 2023-01-15 DIAGNOSIS — Z23 Encounter for immunization: Secondary | ICD-10-CM | POA: Diagnosis not present

## 2023-01-15 DIAGNOSIS — M25512 Pain in left shoulder: Secondary | ICD-10-CM | POA: Diagnosis not present

## 2023-01-15 MED ORDER — COVID-19 MRNA VAC-TRIS(PFIZER) 30 MCG/0.3ML IM SUSY
0.3000 mL | PREFILLED_SYRINGE | Freq: Once | INTRAMUSCULAR | 0 refills | Status: AC
  Filled 2023-01-15: qty 0.3, 1d supply, fill #0

## 2023-01-15 MED ORDER — INFLUENZA VAC A&B SURF ANT ADJ 0.5 ML IM SUSY
0.5000 mL | PREFILLED_SYRINGE | Freq: Once | INTRAMUSCULAR | 0 refills | Status: AC
  Filled 2023-01-15: qty 0.5, 1d supply, fill #0

## 2023-01-16 ENCOUNTER — Encounter: Payer: Self-pay | Admitting: Adult Health

## 2023-01-16 DIAGNOSIS — E119 Type 2 diabetes mellitus without complications: Secondary | ICD-10-CM | POA: Diagnosis not present

## 2023-01-16 NOTE — Telephone Encounter (Signed)
Pt has been scheduled.

## 2023-01-16 NOTE — Telephone Encounter (Signed)
Pt called back, message from [place], [place] was relayed.

## 2023-01-17 ENCOUNTER — Ambulatory Visit (INDEPENDENT_AMBULATORY_CARE_PROVIDER_SITE_OTHER): Payer: Medicare Other | Admitting: Adult Health

## 2023-01-17 ENCOUNTER — Encounter: Payer: Self-pay | Admitting: Adult Health

## 2023-01-17 ENCOUNTER — Other Ambulatory Visit (HOSPITAL_BASED_OUTPATIENT_CLINIC_OR_DEPARTMENT_OTHER): Payer: Self-pay

## 2023-01-17 VITALS — BP 120/60 | HR 70 | Temp 97.9°F

## 2023-01-17 DIAGNOSIS — R519 Headache, unspecified: Secondary | ICD-10-CM | POA: Diagnosis not present

## 2023-01-17 MED ORDER — AMITRIPTYLINE HCL 25 MG PO TABS
25.0000 mg | ORAL_TABLET | Freq: Every day | ORAL | 1 refills | Status: DC
  Filled 2023-01-17: qty 90, 90d supply, fill #0

## 2023-01-17 NOTE — Progress Notes (Signed)
Subjective:    Patient ID: Breanna Webster, female    DOB: 12/12/1945, 77 y.o.   MRN: [id number]  HPI 77 year old female who  has a past medical history of Anxiety, Arthritis, Atrial fibrillation with RVR (HCC) (10/21/2018), Back pain, Bacteremia due to Streptococcus Salivarius (10/07/2018), Blood transfusion without reported diagnosis, CAD (coronary artery disease), Carotid artery stenosis, Cerebellar stroke, acute (HCC) (10/21/2018), Cerebrovascular accident (CVA) due to embolism of right posterior cerebral artery (HCC) (08/03/2020), Chest pain, CHF (congestive heart failure) (HCC), Chronic lower back pain, Cirrhosis (HCC), Colon polyps, Diverticulitis, Diverticulosis, Esophageal thickening, GERD (gastroesophageal reflux disease), Gout, Grave's disease, H/O total shoulder replacement, left (06/01/2022), Heart murmur, History of cardioembolic stroke (57/84/6962), History of colonic polyps (05/22/2017), History of hiatal hernia, Hyperlipidemia, Hypertension, Hypothyroidism, IBS (irritable bowel syndrome), Osteopenia, Parkinson's syndrome (04/23/2020), Pulmonary nodules, S/P TAVR (transcatheter aortic valve replacement), Shortness of breath on exertion, Stroke (HCC) (2021), Thalassemia minor, Thyroid disease, and Type II diabetes mellitus (HCC).  She is being evaluated today for chronic headache.  She reports having a headache for over a month.  Headache is felt all over and not necessarily in one part of head, mostly on left side. Sound and light can exacerbate her headache.   He had CT of the head without contrast in May 2024 which showed no acute infarction, hemorrhage, hydrocephalus, mass, or any other acute intracranial process.  She denies fever, fatigue, weight loss, jaw claudication, visual symptoms, scalp pain when combing her hair   Does have a past history of left-sided headache which was treated with amitriptyline 25 mg and she also tried gabapentin in the past.  Gabapentin did not help  but Elavil did control her headaches but was stopped when she was no longer having headaches.    Review of Systems See HPI   Past Medical History:  Diagnosis Date   Anxiety    Arthritis    "back" (04/22/2018)   Atrial fibrillation with RVR (HCC) 10/21/2018   Back pain    Bacteremia due to Streptococcus Salivarius 10/07/2018   Blood transfusion without reported diagnosis    CAD (coronary artery disease)    a. 03/2018 s/p PCI/DES to the RCA (3.0x15 Onyx DES).   Carotid artery stenosis    Mild   Cerebellar stroke, acute (HCC) 10/21/2018   Cerebrovascular accident (CVA) due to embolism of right posterior cerebral artery (HCC) 08/03/2020   Chest pain    CHF (congestive heart failure) (HCC)    Chronic lower back pain    Cirrhosis (HCC)    Colon polyps    Diverticulitis    Diverticulosis    Esophageal thickening    seen on pre TAVR CT scan, also questionable cirrhosis. MRI recommended. Will refer to GI   GERD (gastroesophageal reflux disease)    Gout    Grave's disease    H/O total shoulder replacement, left 06/01/2022   Heart murmur    History of cardioembolic stroke 03/29/2020   History of colonic polyps 05/22/2017   History of hiatal hernia    Hyperlipidemia    Hypertension    Hypothyroidism    IBS (irritable bowel syndrome)    Osteopenia    Parkinson's syndrome 04/23/2020   Pulmonary nodules    seen on pre TAVR CT. likley benign. no follow up recommended if pt low risk.   S/P TAVR (transcatheter aortic valve replacement)    Shortness of breath on exertion    Stroke (HCC) 2021   x2   Thalassemia minor  Thyroid disease    Type II diabetes mellitus (HCC)     Social History   Socioeconomic History   Marital status: Married    Spouse name: Not on file   Number of children: 2   Years of education: Not on file   Highest education level: Associate degree: academic program  Occupational History   Occupation: Health and safety inspector of the [employer]  Tobacco  Use   Smoking status: Never    Passive exposure: Never   Smokeless tobacco: Never  Vaping Use   Vaping status: Never Used  Substance and Sexual Activity   Alcohol use: No    Alcohol/week: 0.0 standard drinks of alcohol   Drug use: Never   Sexual activity: Not Currently  Other Topics Concern   Not on file  Social History Narrative   Lives with husband in a one story home.     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   Education: college.   Social Determinants of Health   Financial Resource Strain: Patient Declined (12/09/2022)   Overall Financial Resource Strain (CARDIA)    Difficulty of Paying Living Expenses: Patient declined  Food Insecurity: No Food Insecurity (12/09/2022)   Hunger Vital Sign    Worried About Running Out of Food in the Last Year: Never true    Ran Out of Food in the Last Year: Never true  Transportation Needs: Unknown (12/09/2022)   PRAPARE - Transportation    Lack of Transportation (Medical): No    Lack of Transportation (Non-Medical): Patient declined  Physical Activity: Unknown (12/09/2022)   Exercise Vital Sign    Days of Exercise per Week: Patient declined    Minutes of Exercise per Session: 40 min  Stress: No Stress Concern Present (12/09/2022)   [employer] of Occupational Health - Occupational Stress Questionnaire    Feeling of Stress : Only a little  Social Connections: Unknown (12/09/2022)   Social Connection and Isolation Panel [NHANES]    Frequency of Communication with Friends and Family: More than three times a week    Frequency of Social Gatherings with Friends and Family: Patient declined    Attends Religious Services: Patient declined    Active Member of Clubs or Organizations: Patient declined    Attends Engineer, structural: More than 4 times per year    Marital Status: Married  Catering manager Violence: Not At Risk (10/16/2022)   Humiliation, Afraid,  Rape, and Kick questionnaire    Fear of Current or Ex-Partner: No    Emotionally Abused: No    Physically Abused: No    Sexually Abused: No    Past Surgical History:  Procedure Laterality Date   2 HOUR PH STUDY N/A 03/03/2018   Procedure: 24 HOUR PH STUDY;  Surgeon: Napoleon Form, MD;  Location: [facility];  Service: Endoscopy;  Laterality: N/A;   COLONOSCOPY  2023   COLONOSCOPY W/ BIOPSIES AND POLYPECTOMY     CORONARY ANGIOGRAPHY Right 04/21/2018   Procedure: CORONARY ANGIOGRAPHY (CATH LAB);  Surgeon: Lyn Records, MD;  Location: [facility] INVASIVE CV LAB;  Service: Cardiovascular;  Laterality: Right;   CORONARY STENT INTERVENTION N/A 04/22/2018   Procedure: CORONARY STENT INTERVENTION;  Surgeon: Lyn Records, MD;  Location: [facility] INVASIVE CV LAB;  Service: Cardiovascular;  Laterality: N/A;   DILATION AND CURETTAGE OF UTERUS     ESOPHAGEAL MANOMETRY N/A 03/03/2018   Procedure: ESOPHAGEAL MANOMETRY (EM);  Surgeon: Napoleon Form,  MD;  Location: [facility];  Service: Endoscopy;  Laterality: N/A;   HYSTEROSCOPY     fibroids   LAPAROSCOPIC CHOLECYSTECTOMY  1985   LAPAROSCOPY     fibroids   NISSEN FUNDOPLICATION  1990s   POLYPECTOMY     REVERSE SHOULDER ARTHROPLASTY Left 06/01/2022   Procedure: REVERSE SHOULDER ARTHROPLASTY;  Surgeon: Beverely Low, MD;  Location: [facility] ORS;  Service: Orthopedics;  Laterality: Left;  choice with interscalene block   RIGHT/LEFT HEART CATH AND CORONARY ANGIOGRAPHY N/A 02/20/2018   Procedure: RIGHT/LEFT HEART CATH AND CORONARY ANGIOGRAPHY;  Surgeon: Lyn Records, MD;  Location: [facility] INVASIVE CV LAB;  Service: Cardiovascular;  Laterality: N/A;   TEE WITHOUT CARDIOVERSION N/A 07/08/2018   Procedure: TRANSESOPHAGEAL ECHOCARDIOGRAM (TEE);  Surgeon: Kathleene Hazel, MD;  Location[facility];  Service: Open Heart Surgery;  Laterality: N/A;   TEE WITHOUT CARDIOVERSION  10/07/2018   TEE WITHOUT CARDIOVERSION N/A 10/07/2018   Procedure: TRANSESOPHAGEAL  ECHOCARDIOGRAM (TEE);  Surgeon: Jake Bathe, MD;  Location: [facility] ENDOSCOPY;  Service: Cardiovascular;  Laterality: N/A;   TONSILLECTOMY     age 74   TRANSCATHETER AORTIC VALVE REPLACEMENT, TRANSFEMORAL N/A 07/08/2018   Procedure: TRANSCATHETER AORTIC VALVE REPLACEMENT, TRANSFEMORAL;  Surgeon: Kathleene Hazel, MD;  Location: [facility] OR;  Service: Open Heart Surgery;  Laterality: N/A;    Family History  Adopted: Yes  Problem Relation Age of Onset   Healthy Son        x 2   Headache Other        Cluster headaches   Heart failure Mother    Colon cancer Neg Hx    Pancreatic cancer Neg Hx    Rectal cancer Neg Hx    Stomach cancer Neg Hx     No Known Allergies  Current Outpatient Medications on File Prior to Visit  Medication Sig Dispense Refill   amantadine (SYMMETREL) 50 MG/5ML solution Take 5 mLs (50 mg total) by mouth 2 (two) times daily. 473 mL 5   amoxicillin (AMOXIL) 500 MG capsule Take 2 capsules by mouth 1 hour before dentist or other procedures and 2 capsules by mouth after visit 4 capsule 4   b complex vitamins capsule Take 1 capsule by mouth daily.     BD PEN NEEDLE NANO 2ND GEN 32G X 4 MM MISC      colchicine 0.6 MG tablet Take 1 tablet (0.6 mg total) by mouth daily. 5 tablet 1   Continuous Blood Gluc Receiver (FREESTYLE LIBRE READER) DEVI Use as directed 1 each 0   Continuous Glucose Sensor (FREESTYLE LIBRE 3 SENSOR) MISC Place one sensor every 14 days 2 each 11   diltiazem (CARDIZEM CD) 120 MG 24 hr capsule Take 1 capsule (120 mg total) by mouth daily. 90 capsule 3   DULoxetine (CYMBALTA) 60 MG capsule Take 1 capsule (60 mg total) by mouth daily. 30 capsule 1   HYDROcodone-acetaminophen (NORCO/VICODIN) 5-325 MG tablet Take 1 tablet by mouth every 4 (four) hours as needed for moderate pain or severe pain. 30 tablet 0   insulin NPH Human (HUMULIN N) 100 UNIT/ML injection Inject 20 units under the skin in the morning and 14 units in evening 20 mL 5   Insulin NPH, Human,,  Isophane, (HUMULIN N KWIKPEN) 100 UNIT/ML Kiwkpen Inject 20 units into the skin every morning and 14 units every evening. 15 mL 6   Insulin NPH, Human,, Isophane, (HUMULIN N KWIKPEN) 100 UNIT/ML Kiwkpen Inject 20 units into the skin every morning and 14  units every evening. daily 30 days 15 mL 3   Insulin Pen Needle (UNIFINE PENTIPS) 32G X 4 MM MISC Inject 4 times subcutaneously 400 each 2   insulin regular (HUMULIN R) 100 units/mL injection Inject 0.1 mLs (10 Units total) into the skin 3 (three) times daily before meals 10 mL 3   Insulin Syringe-Needle U-100 (ULTICARE INSULIN SYRINGE) 31G X 5/16" 1 ML MISC Inject 4 times daily subcutaneously 100 each 1   metFORMIN (GLUCOPHAGE-XR) 500 MG 24 hr tablet Take 1 tablet by mouth 2 times daily 180 tablet 2   Multiple Vitamin (MULITIVITAMIN WITH MINERALS) TABS Take 1 tablet by mouth daily.     nitroGLYCERIN (NITROSTAT) 0.4 MG SL tablet Place 1 tablet (0.4 mg total) under the tongue every 5 (five) minutes as needed for chest pain. 25 tablet 4   omeprazole (PRILOSEC) 20 MG capsule Take 1 capsule (20 mg total) by mouth daily. Please keep your August appointment for further refills. Thank you 90 capsule 0   oxyCODONE (OXY IR/ROXICODONE) 5 MG immediate release tablet Take 1 tablet (5 mg total) by mouth 2 (two) times daily. 60 tablet 0   oxyCODONE (OXY IR/ROXICODONE) 5 MG immediate release tablet Take 2 tablets (10 mg total) by mouth 2 (two) times daily as needed. 120 tablet 0   polyethylene glycol (MIRALAX) 17 g packet Take 17 g by mouth daily as needed.     potassium chloride SA (KLOR-CON M) 20 MEQ tablet Take 1 tablet (20 mEq total) by mouth daily. Take in concurrence with torsemide. 40 tablet 0   primidone (MYSOLINE) 50 MG tablet Take 1 tablet (50 mg total) by mouth 2 (two) times daily. 60 tablet 3   rivaroxaban (XARELTO) 20 MG TABS tablet Take 1 tablet (20 mg total) by mouth daily with supper. 30 tablet 5   SYNTHROID 137 MCG tablet Take 1 tablet (137 mcg  total) by mouth every morning on empty stomach. 90 tablet 1   tiZANidine (ZANAFLEX) 2 MG tablet Take 1 tablet (2 mg total) by mouth 2 (two) times daily as needed. 60 tablet 0   tiZANidine (ZANAFLEX) 4 MG tablet Take 1 tablet (4 mg total) by mouth 3 (three) times daily. 90 tablet 1   torsemide (DEMADEX) 20 MG tablet Take 2 tablets (40 mg total) by mouth daily. 60 tablet 6   Vibegron (GEMTESA) 75 MG TABS Take 75 mg by mouth at bedtime.     No current facility-administered medications on file prior to visit.    BP 120/60   Pulse 70   Temp 97.9 F (36.6 C) (Oral)   SpO2 98%       Objective:   Physical Exam Vitals and nursing note reviewed.  Constitutional:      Appearance: Normal appearance.  Musculoskeletal:        General: Normal range of motion.  Skin:    General: Skin is warm and dry.  Neurological:     General: No focal deficit present.     Mental Status: She is alert and oriented to person, place, and time.     Motor: Weakness present.     Gait: Gait abnormal (in wheelchair).  Psychiatric:        Mood and Affect: Mood normal.        Behavior: Behavior normal.        Thought Content: Thought content normal.        Judgment: Judgment normal.        Assessment & Plan:  1.  Acute intractable headache, unspecified headache type - Will check labs and r/o temporal arteritis but this seems to be her typical migraine headache. Will start her back on low dose Elavil at bedtime  - Sedimentation Rate; Future - C-reactive Protein; Future - CBC; Future - amitriptyline (ELAVIL) 25 MG tablet; Take 1 tablet (25 mg total) by mouth at bedtime.  Dispense: 90 tablet; Refill: 1 - CBC - C-reactive Protein - Sedimentation Rate  Shirline Frees, NP  Time spent with patient today was 32 minutes which consisted of chart review, discussing headaches, work up, treatment answering questions and documentation.

## 2023-01-18 ENCOUNTER — Other Ambulatory Visit (HOSPITAL_BASED_OUTPATIENT_CLINIC_OR_DEPARTMENT_OTHER): Payer: Self-pay

## 2023-01-18 DIAGNOSIS — E1165 Type 2 diabetes mellitus with hyperglycemia: Secondary | ICD-10-CM | POA: Diagnosis not present

## 2023-01-18 DIAGNOSIS — E039 Hypothyroidism, unspecified: Secondary | ICD-10-CM | POA: Diagnosis not present

## 2023-01-18 LAB — CBC
HCT: 33.5 % — ABNORMAL LOW (ref 36.0–46.0)
Hemoglobin: 10.3 g/dL — ABNORMAL LOW (ref 12.0–15.0)
MCHC: 30.6 g/dL (ref 30.0–36.0)
MCV: 63.2 fL — ABNORMAL LOW (ref 78.0–100.0)
Platelets: 202 10*3/uL (ref 150.0–400.0)
RBC: 5.3 Mil/uL — ABNORMAL HIGH (ref 3.87–5.11)
RDW: 15.2 % (ref 11.5–15.5)
WBC: 6 10*3/uL (ref 4.0–10.5)

## 2023-01-18 LAB — SEDIMENTATION RATE: Sed Rate: 32 mm/h — ABNORMAL HIGH (ref 0–30)

## 2023-01-18 LAB — C-REACTIVE PROTEIN: CRP: <1 mg/dL (ref 0.5–20.0)

## 2023-01-18 MED ORDER — APIXABAN 5 MG PO TABS
5.0000 mg | ORAL_TABLET | Freq: Two times a day (BID) | ORAL | 0 refills | Status: DC
  Filled 2023-01-18: qty 60, 30d supply, fill #0

## 2023-01-22 DIAGNOSIS — I4891 Unspecified atrial fibrillation: Secondary | ICD-10-CM | POA: Diagnosis not present

## 2023-01-22 DIAGNOSIS — R251 Tremor, unspecified: Secondary | ICD-10-CM | POA: Diagnosis not present

## 2023-01-22 DIAGNOSIS — G20C Parkinsonism, unspecified: Secondary | ICD-10-CM | POA: Diagnosis not present

## 2023-01-23 ENCOUNTER — Encounter: Payer: Self-pay | Admitting: Hematology

## 2023-01-23 ENCOUNTER — Other Ambulatory Visit (HOSPITAL_BASED_OUTPATIENT_CLINIC_OR_DEPARTMENT_OTHER): Payer: Self-pay

## 2023-01-24 ENCOUNTER — Other Ambulatory Visit (HOSPITAL_BASED_OUTPATIENT_CLINIC_OR_DEPARTMENT_OTHER): Payer: Self-pay

## 2023-01-24 MED ORDER — SYNTHROID 137 MCG PO TABS
137.0000 ug | ORAL_TABLET | Freq: Every morning | ORAL | 1 refills | Status: DC
  Filled 2023-01-24: qty 90, 90d supply, fill #0
  Filled 2023-06-17: qty 90, 90d supply, fill #1

## 2023-01-25 ENCOUNTER — Other Ambulatory Visit: Payer: Self-pay

## 2023-01-28 ENCOUNTER — Other Ambulatory Visit: Payer: Self-pay | Admitting: Adult Health

## 2023-01-28 ENCOUNTER — Other Ambulatory Visit (HOSPITAL_BASED_OUTPATIENT_CLINIC_OR_DEPARTMENT_OTHER): Payer: Self-pay

## 2023-01-28 DIAGNOSIS — F32A Depression, unspecified: Secondary | ICD-10-CM

## 2023-01-28 DIAGNOSIS — G894 Chronic pain syndrome: Secondary | ICD-10-CM

## 2023-01-28 NOTE — Telephone Encounter (Signed)
The original prescription was discontinued on 12/26/2022 by Sherrin Daisy, CMA for the following reason: Reorder. Renewing this prescription may not be appropriate.

## 2023-01-29 ENCOUNTER — Other Ambulatory Visit: Payer: Self-pay | Admitting: Adult Health

## 2023-01-29 ENCOUNTER — Other Ambulatory Visit (HOSPITAL_BASED_OUTPATIENT_CLINIC_OR_DEPARTMENT_OTHER): Payer: Self-pay

## 2023-01-29 MED ORDER — DULOXETINE HCL 30 MG PO CPEP
30.0000 mg | ORAL_CAPSULE | Freq: Every day | ORAL | 1 refills | Status: DC
Start: 2023-01-29 — End: 2023-02-06
  Filled 2023-01-29: qty 90, 90d supply, fill #0

## 2023-01-30 ENCOUNTER — Other Ambulatory Visit (HOSPITAL_BASED_OUTPATIENT_CLINIC_OR_DEPARTMENT_OTHER): Payer: Self-pay

## 2023-01-30 DIAGNOSIS — M8580 Other specified disorders of bone density and structure, unspecified site: Secondary | ICD-10-CM | POA: Diagnosis not present

## 2023-01-30 DIAGNOSIS — G20A1 Parkinson's disease without dyskinesia, without mention of fluctuations: Secondary | ICD-10-CM | POA: Diagnosis not present

## 2023-01-30 MED ORDER — ALENDRONATE SODIUM 70 MG PO TABS
70.0000 mg | ORAL_TABLET | ORAL | 3 refills | Status: DC
  Filled 2023-01-30: qty 12, 84d supply, fill #0

## 2023-01-31 ENCOUNTER — Other Ambulatory Visit (HOSPITAL_BASED_OUTPATIENT_CLINIC_OR_DEPARTMENT_OTHER): Payer: Self-pay

## 2023-01-31 ENCOUNTER — Other Ambulatory Visit: Payer: Self-pay | Admitting: Adult Health

## 2023-01-31 ENCOUNTER — Other Ambulatory Visit (HOSPITAL_BASED_OUTPATIENT_CLINIC_OR_DEPARTMENT_OTHER): Payer: Self-pay | Admitting: Emergency Medicine

## 2023-02-01 ENCOUNTER — Other Ambulatory Visit: Payer: Self-pay | Admitting: General Practice

## 2023-02-01 ENCOUNTER — Telehealth: Payer: Self-pay | Admitting: Neurology

## 2023-02-01 ENCOUNTER — Encounter: Payer: Self-pay | Admitting: Adult Health

## 2023-02-01 ENCOUNTER — Other Ambulatory Visit (HOSPITAL_BASED_OUTPATIENT_CLINIC_OR_DEPARTMENT_OTHER): Payer: Self-pay

## 2023-02-01 DIAGNOSIS — I4891 Unspecified atrial fibrillation: Secondary | ICD-10-CM

## 2023-02-01 DIAGNOSIS — G8194 Hemiplegia, unspecified affecting left nondominant side: Secondary | ICD-10-CM

## 2023-02-01 DIAGNOSIS — I480 Paroxysmal atrial fibrillation: Secondary | ICD-10-CM

## 2023-02-01 DIAGNOSIS — I10 Essential (primary) hypertension: Secondary | ICD-10-CM

## 2023-02-01 DIAGNOSIS — I63423 Cerebral infarction due to embolism of bilateral anterior cerebral arteries: Secondary | ICD-10-CM

## 2023-02-01 MED ORDER — COLCHICINE 0.6 MG PO TABS
ORAL_TABLET | ORAL | 1 refills | Status: DC
  Filled 2023-02-01: qty 20, 20d supply, fill #0

## 2023-02-01 NOTE — Telephone Encounter (Signed)
Pt called, requesting Dr. Vickey Huger to read the report for Parkinson Disease from Dr. Mickie Bail. Dr. Mickie Bail believes its Parkinson Syndrome. Would like a call back to discuss what Dr. Vickey Huger thinks.

## 2023-02-03 ENCOUNTER — Other Ambulatory Visit (HOSPITAL_BASED_OUTPATIENT_CLINIC_OR_DEPARTMENT_OTHER): Payer: Self-pay

## 2023-02-03 MED ORDER — DILTIAZEM HCL ER COATED BEADS 120 MG PO CP24
120.0000 mg | ORAL_CAPSULE | Freq: Every day | ORAL | 3 refills | Status: DC
  Filled 2023-02-04: qty 90, 90d supply, fill #0
  Filled 2023-05-16: qty 90, 90d supply, fill #1

## 2023-02-04 ENCOUNTER — Other Ambulatory Visit (HOSPITAL_BASED_OUTPATIENT_CLINIC_OR_DEPARTMENT_OTHER): Payer: Self-pay

## 2023-02-04 MED ORDER — PRIMIDONE 50 MG PO TABS
50.0000 mg | ORAL_TABLET | Freq: Every day | ORAL | 3 refills | Status: DC
  Filled 2023-02-04: qty 60, 60d supply, fill #0

## 2023-02-04 NOTE — Progress Notes (Unsigned)
Cardiology Clinic Note   Patient Name: Breanna Webster Date of Encounter: 02/06/2023  Primary Care Provider:  Shirline Frees, NP Primary Cardiologist:  Parke Poisson, MD  Patient Profile    Breanna Webster 77 year old female presents to the clinic today for evaluation of her lower extremity swelling.  Past Medical History    Past Medical History:  Diagnosis Date   Anxiety    Arthritis    "back" (04/22/2018)   Atrial fibrillation with RVR (HCC) 10/21/2018   Back pain    Bacteremia due to Streptococcus Salivarius 10/07/2018   Blood transfusion without reported diagnosis    CAD (coronary artery disease)    a. 03/2018 s/p PCI/DES to the RCA (3.0x15 Onyx DES).   Carotid artery stenosis    Mild   Cerebellar stroke, acute (HCC) 10/21/2018   Cerebrovascular accident (CVA) due to embolism of right posterior cerebral artery (HCC) 08/03/2020   Chest pain    CHF (congestive heart failure) (HCC)    Chronic lower back pain    Cirrhosis (HCC)    Colon polyps    Diverticulitis    Diverticulosis    Esophageal thickening    seen on pre TAVR CT scan, also questionable cirrhosis. MRI recommended. Will refer to GI   GERD (gastroesophageal reflux disease)    Gout    Grave's disease    H/O total shoulder replacement, left 06/01/2022   Heart murmur    History of cardioembolic stroke 03/29/2020   History of colonic polyps 05/22/2017   History of hiatal hernia    Hyperlipidemia    Hypertension    Hypothyroidism    IBS (irritable bowel syndrome)    Osteopenia    Parkinson's syndrome (HCC) 04/23/2020   Pulmonary nodules    seen on pre TAVR CT. likley benign. no follow up recommended if pt low risk.   S/P TAVR (transcatheter aortic valve replacement)    Shortness of breath on exertion    Stroke (HCC) 2021   x2   Thalassemia minor    Thyroid disease    Type II diabetes mellitus (HCC)    Past Surgical History:  Procedure Laterality Date   64 HOUR PH STUDY N/A 03/03/2018    Procedure: 24 HOUR PH STUDY;  Surgeon: Napoleon Form, MD;  Location: [facility] ENDOSCOPY;  Service: Endoscopy;  Laterality: N/A;   COLONOSCOPY  2023   COLONOSCOPY W/ BIOPSIES AND POLYPECTOMY     CORONARY ANGIOGRAPHY Right 04/21/2018   Procedure: CORONARY ANGIOGRAPHY (CATH LAB);  Surgeon: Lyn Records, MD;  Location: [facility] INVASIVE CV LAB;  Service: Cardiovascular;  Laterality: Right;   CORONARY STENT INTERVENTION N/A 04/22/2018   Procedure: CORONARY STENT INTERVENTION;  Surgeon: Lyn Records, MD;  Location: [facility] INVASIVE CV LAB;  Service: Cardiovascular;  Laterality: N/A;   DILATION AND CURETTAGE OF UTERUS     ESOPHAGEAL MANOMETRY N/A 03/03/2018   Procedure: ESOPHAGEAL MANOMETRY (EM);  Surgeon: Napoleon Form, MD;  Location: [facility] ENDOSCOPY;  Service: Endoscopy;  Laterality: N/A;   HYSTEROSCOPY     fibroids   LAPAROSCOPIC CHOLECYSTECTOMY  1985   LAPAROSCOPY     fibroids   NISSEN FUNDOPLICATION  1990s   POLYPECTOMY     REVERSE SHOULDER ARTHROPLASTY Left 06/01/2022   Procedure: REVERSE SHOULDER ARTHROPLASTY;  Surgeon: Beverely Low, MD;  Location: [facility] ORS;  Service: Orthopedics;  Laterality: Left;  choice with interscalene block   RIGHT/LEFT HEART CATH AND CORONARY ANGIOGRAPHY N/A 02/20/2018   Procedure: RIGHT/LEFT HEART CATH AND CORONARY  ANGIOGRAPHY;  Surgeon: Lyn Records, MD;  Location: [facility] INVASIVE CV LAB;  Service: Cardiovascular;  Laterality: N/A;   TEE WITHOUT CARDIOVERSION N/A 07/08/2018   Procedure: TRANSESOPHAGEAL ECHOCARDIOGRAM (TEE);  Surgeon: Kathleene Hazel, MD;  Location: [facility] OR;  Service: Open Heart Surgery;  Laterality: N/A;   TEE WITHOUT CARDIOVERSION  10/07/2018   TEE WITHOUT CARDIOVERSION N/A 10/07/2018   Procedure: TRANSESOPHAGEAL ECHOCARDIOGRAM (TEE);  Surgeon: Jake Bathe, MD;  Location: [facility] ENDOSCOPY;  Service: Cardiovascular;  Laterality: N/A;   TONSILLECTOMY     age 45   TRANSCATHETER AORTIC VALVE REPLACEMENT, TRANSFEMORAL N/A 07/08/2018   Procedure:  TRANSCATHETER AORTIC VALVE REPLACEMENT, TRANSFEMORAL;  Surgeon: Kathleene Hazel, MD;  Location: [facility] OR;  Service: Open Heart Surgery;  Laterality: N/A;    Allergies  No Known Allergies  History of Present Illness    Breanna Webster has a PMH of CHF, hypertension, hyperlipidemia, Parkinson's, history of CVA, Graves' disease, type 2 diabetes, coronary artery disease status post PCI with DES x 1 to mid RCA 12/19, mitral valve endocarditis 6/20, paroxysmal atrial fibrillation on apixaban, 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  She was seen in follow-up by Jari Favre, PA-C in March for dizziness.  It was felt that her symptoms were related to decreased blood pressure.  She was checking her blood pressure with a wrist cuff.  She was encouraged to obtain a new blood pressure cuff.  She was noted to have some lower extremity edema with left greater than right which is chronic.  Prior lower extremity ultrasounds negative for DVT.  Lower extremity compression stockings were recommended however, they were not previously successful.  She was seen again in follow-up by Jari Favre, PA-C on 08/10/2022.  She reported that she was frustrated with using the bathroom 8-9 times a day with spironolactone.  Her legs were showing less edema.  She reported that she was drinking several cups of water daily.  She was following a low-no salt diet.  She requested discontinuation of spironolactone.  She was also concerned about her lower blood pressure.  She denied shortness of breath and dyspnea on exertion.  She denied chest pain, orthopnea, PND, and palpitations.  Patient contacted nurse triage line 09/12/2022 and reported intermittent periods of shortness of breath and increased lower extremity swelling with left leg greater than right.  She presented to the clinic 09/13/2022 for follow-up evaluation and stated she continues to notice lower extremity  swelling.  We reviewed the importance of fluid restriction, lower extremity support stockings, and elevating lower extremities to chest height.  She and her husband expressed understanding.  She reported that she had not been consuming extra fluid.  Her husband reported  that she drank several glasses of ice water daily.  Her blood pressure was 104/60.  Her EKG showed normal sinus rhythm with possible left atrial enlargement 73 bpm.  I  increased her torsemide to 60 mg for 3 days and then planned to resume normal dosing.  I increased her potassium to 20 mill equivalents for 3 days.  I planned BMP in 1 week, gave 48 ounce fluid restriction and planned follow-up in 1 to 2 weeks.  She presented to the clinic 09/19/22 for follow-up evaluation and stated she tolerated the medication well.  Her weight was unchanged.  She reported that she did not weigh by scale in the clinic last week.  She reported not measuring fluid intake.  We reviewed the  importance of fluid restriction and lower extremity support stockings.  I repeated increased diuresis and increase potassium.  I added Zaroxolyn 2.5 mg for 2 days and planned repeat BMP in 1 week.  I planned follow-up in 2 weeks.  She and her husband expressed understanding.  I  also increased her torsemide from 30 mg daily to 40 mg daily after increased diuresis.   She presented to the clinic 10/04/22 for follow-up evaluation and stated she felt she still had some swelling in her feet.  Her weight was much better at 176.6 pounds.  Her previous weight was 182 pounds.  She had increased urination with increased diuresis.  She had been wearing lower extremity support stockings.  I helped put them on.  She had some left upper extremity swelling postoperatively.  I encouraged them to use elevation and an upper extremity compression sleeve.  I also recommended that they reach out to orthopedics for physical therapy recommendations.  I ordered a BMP and planned follow-up in 4  months.  She presents to the clinic today for follow-up evaluation and states she feels that her lower extremities are swollen and that her left arm is swollen.  We reviewed the importance of fluid restriction and elevating lower extremities as well as using lower extremity support stockings.  She and her husband expressed understanding.  She has been following a low-sodium diet.  She is no longer taking physical therapy for her left upper arm.  She continues to do home type exercises.  We reviewed the importance of keeping her left upper extremity elevated.  Her weight remains stable.  I will give her a weight log, plan to repeat echocardiogram as scheduled, plan follow-up in 6 to 8 months and we also reviewed need for primary cardiologist.  I recommended Dr. Jacques Navy.  Today she denies chest pain, fatigue, palpitations, melena, hematuria, hemoptysis, diaphoresis, weakness, presyncope, syncope, orthopnea, and PND.    Home Medications    Prior to Admission medications   Medication Sig Start Date End Date Taking? Authorizing Provider  acetaminophen (TYLENOL) 500 MG tablet Take 500 mg by mouth every 6 (six) hours as needed for mild pain.    [provider]  acetaminophen (TYLENOL) 500 MG tablet Take 2 tablets (1,000 mg total) by mouth every 8 (eight) hours. 09/01/22 10/01/22  Kommor, Madison, MD  apixaban (ELIQUIS) 5 MG TABS tablet Take 1 tablet (5 mg total) by mouth 2 (two) times daily. 01/02/22   Lyn Records, MD  b complex vitamins capsule Take 1 capsule by mouth daily.    [provider]  BD PEN NEEDLE NANO 2ND GEN 32G X 4 MM MISC  11/26/20   [provider]  cholecalciferol (VITAMIN D) 25 MCG (1000 UNIT) tablet Take 1,000 Units by mouth daily.    [provider]  Continuous Blood Gluc Receiver (FREESTYLE LIBRE READER) DEVI Use as directed 03/14/22     Continuous Blood Gluc Sensor (FREESTYLE LIBRE 3 SENSOR) MISC Place one sensor every 14 days 03/14/22      cyclobenzaprine (FLEXERIL) 5 MG tablet Take 1 tablet (5 mg total) by mouth every evening. 08/28/22     diltiazem (CARDIZEM CD) 120 MG 24 hr capsule Take 1 capsule (120 mg total) by mouth daily. 01/02/22   Lyn Records, MD  escitalopram (LEXAPRO) 20 MG tablet TAKE ONE TABLET BY MOUTH DAILY 06/15/22   Nafziger, Kandee Keen, NP  Evolocumab (REPATHA SURECLICK) 140 MG/ML SOAJ Inject 1 dose into the skin every 14 (fourteen) days.  03/27/22   Lyn Records, MD  insulin NPH Human (HUMULIN N) 100 UNIT/ML injection Inject 20 units under the skin in the morning and 14 units in evening 02/20/22     Insulin NPH, Human,, Isophane, (HUMULIN N KWIKPEN) 100 UNIT/ML Kiwkpen Inject 20 units into the skin every morning and 14 units every evening. 02/20/22     Insulin Pen Needle (UNIFINE PENTIPS) 32G X 4 MM MISC Inject 4 times subcutaneously 06/23/21     insulin regular (HUMULIN R) 100 units/mL injection Inject 10 Units total into the skin 3 (three) times daily before meals. 02/20/22     Insulin Regular Human (NOVOLIN R FLEXPEN RELION) 100 UNIT/ML KwikPen Inject 5 to 10 units under the skin three times daily. 02/20/22     Insulin Syringe-Needle U-100 (ULTICARE INSULIN SYRINGE) 31G X 5/16" 1 ML MISC Inject 4 times daily subcutaneously 01/12/22   Shirline Frees, NP  metFORMIN (GLUCOPHAGE-XR) 500 MG 24 hr tablet Take 1 tablet by mouth 2 times daily 08/29/22     Multiple Vitamin (MULITIVITAMIN WITH MINERALS) TABS Take 1 tablet by mouth daily.    [provider]  nitroGLYCERIN (NITROSTAT) 0.4 MG SL tablet Place 1 tablet (0.4 mg total) under the tongue every 5 (five) minutes as needed for chest pain. 11/14/21   Lyn Records, MD  omeprazole (PRILOSEC) 20 MG capsule Take 1 capsule (20 mg total) by mouth daily. 06/07/22   [name], Willaim Rayas, MD  oxyCODONE (ROXICODONE) 5 MG immediate release tablet Take 1 tablet (5 mg total) by mouth every 6 (six) hours as needed for breakthrough pain. 09/01/22   Kommor, Madison, MD  polyethylene  glycol (MIRALAX / GLYCOLAX) 17 g packet Take 17 g by mouth daily as needed for mild constipation.    [provider]  potassium chloride SA (KLOR-CON M) 20 MEQ tablet Take 1 tablet (20 mEq total) by mouth twice daily for 3 days only, then decrease to taking 1 tablet (20 mEq total) by mouth daily thereafter.  Take in concurrence with torsemide. 06/26/22   Meriam Sprague, MD  pregabalin (LYRICA) 75 MG capsule Take 1 capsule (75 mg total) by mouth 2 (two) times daily. 07/05/22     primidone (MYSOLINE) 50 MG tablet Take 1 tablet (50 mg total) by mouth every morning. 07/31/22   Dohmeier, Porfirio Mylar, MD  SYNTHROID 137 MCG tablet Take 1 tablet (137 mcg total) by mouth every morning on empty stomach. 08/01/22     torsemide (DEMADEX) 20 MG tablet Take 1.5 tablets (30 mg total) by mouth daily. 08/10/22   Sharlene Dory, PA-C    Family History    Family History  Adopted: Yes  Problem Relation Age of Onset   Healthy Son        x 2   Headache Other        Cluster headaches   Heart failure Mother    Colon cancer Neg Hx    Pancreatic cancer Neg Hx    Rectal cancer Neg Hx    Stomach cancer Neg Hx    is adopted.   Social History    Social History   Socioeconomic History   Marital status: Married    Spouse name: Not on file   Number of children: 2   Years of education: Not on file   Highest education level: Associate degree: academic program  Occupational History   Occupation: Health and safety inspector of the [employer]  Tobacco Use   Smoking status: Never    Passive  exposure: Never   Smokeless tobacco: Never  Vaping Use   Vaping status: Never Used  Substance and Sexual Activity   Alcohol use: No    Alcohol/week: 0.0 standard drinks of alcohol   Drug use: Never   Sexual activity: Not Currently  Other Topics Concern   Not on file  Social History Narrative   Lives with husband in a one story home.     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   Education: college.   Social Determinants of Health   Financial Resource Strain: Patient Declined (12/09/2022)   Overall Financial Resource Strain (CARDIA)    Difficulty of Paying Living Expenses: Patient declined  Food Insecurity: No Food Insecurity (12/09/2022)   Hunger Vital Sign    Worried About Running Out of Food in the Last Year: Never true    Ran Out of Food in the Last Year: Never true  Transportation Needs: Unknown (12/09/2022)   PRAPARE - Transportation    Lack of Transportation (Medical): No    Lack of Transportation (Non-Medical): Patient declined  Physical Activity: Unknown (12/09/2022)   Exercise Vital Sign    Days of Exercise per Week: Patient declined    Minutes of Exercise per Session: 40 min  Stress: No Stress Concern Present (12/09/2022)   [employer] of Occupational Health - Occupational Stress Questionnaire    Feeling of Stress : Only a little  Social Connections: Unknown (12/09/2022)   Social Connection and Isolation Panel [NHANES]    Frequency of Communication with Friends and Family: More than three times a week    Frequency of Social Gatherings with Friends and Family: Patient declined    Attends Religious Services: Patient declined    Database administrator or Organizations: Patient declined    Attends Engineer, structural: More than 4 times per year    Marital Status: Married  Catering manager Violence: Not At Risk (10/16/2022)   Humiliation, Afraid, Rape, and Kick questionnaire    Fear of Current or Ex-Partner: No    Emotionally Abused: No    Physically Abused: No    Sexually Abused: No     Review of Systems    General:  No chills, fever, night sweats or weight changes.  Cardiovascular:  No chest pain, dyspnea on exertion, edema, orthopnea, palpitations, paroxysmal nocturnal dyspnea. Dermatological: No rash, lesions/masses Respiratory: No cough, dyspnea Urologic: No hematuria, dysuria Abdominal:   No  nausea, vomiting, diarrhea, bright red blood per rectum, melena, or hematemesis Neurologic:  No visual changes, wkns, changes in mental status. All other systems reviewed and are otherwise negative except as noted above.  Physical Exam    VS:  BP 104/60 (BP Location: Right Arm, Patient Position: Sitting, Cuff Size: Normal)   Pulse 68   Ht 5\' 5"  (1.651 m)   Wt 178 lb (80.7 kg)   SpO2 97%   BMI 29.62 kg/m  , BMI Body mass index is 29.62 kg/m. GEN: Well nourished, well developed, in no acute distress. HEENT: normal. Neck: Supple, no JVD, carotid bruits, or masses. Cardiac: RRR, no murmurs, rubs, or gallops. No clubbing, cyanosis, generalized bilateral lower extremity -1+ stable.  Radials/DP/PT 2+ and equal bilaterally.  Respiratory:  Respirations regular and unlabored, clear to auscultation bilaterally. GI: Soft, nontender, nondistended, BS + x 4. MS: no deformity or atrophy. Skin: warm and dry, no rash. Neuro:  Strength and sensation are intact. Psych: Normal affect.  Accessory  Clinical Findings    Recent Labs: 07/24/2022: Magnesium 1.8 08/31/2022: NT-Pro BNP 511 10/16/2022: B Natriuretic Peptide 182.5 10/17/2022: ALT 20; BUN 13; Creatinine, Ser 0.80; Potassium 3.9; Sodium 136 01/17/2023: Hemoglobin 10.3; Platelets 202.0   Recent Lipid Panel    Component Value Date/Time   CHOL 106 09/06/2021 1018   CHOL 156 11/21/2016 1113   TRIG 101 09/06/2021 1018   HDL 50 09/06/2021 1018   HDL 44 11/21/2016 1113   CHOLHDL 2.1 09/06/2021 1018   VLDL 20 09/06/2021 1018   LDLCALC 36 09/06/2021 1018   LDLCALC 91 11/21/2016 1113   LDLDIRECT 98.0 05/24/2014 1343         ECG personally reviewed by me today-none today.  EKG 09/13/22 normal sinus rhythm possible left atrial enlargement left axis deviation left bundle branch block 73 bpm- No acute changes   Echocardiogram 07/17/2022  IMPRESSIONS     1. Left ventricular ejection fraction, by estimation, is 60 to 65%. The  left ventricle  has normal function. The left ventricle has no regional  wall motion abnormalities. Diastolic function is indeterminant due to  severe MAC.   2. Right ventricular systolic function was not well visualized. The right  ventricular size is not well visualized.   3. Left atrial size was moderately dilated.   4. The mitral valve is degenerative. Trivial mitral valve regurgitation.  Mild calcific mitral stenosis with mean gradient of at HR 68bpm.  Severe mitral annular calcification.   5. The aortic valve has been repaired/replaced. Aortic valve  regurgitation is not visualized. There is a 23 mm Sapien prosthetic (TAVR)  valve present in the aortic position. Procedure Date: 07/08/18. Echo  findings are consistent with normal structure and   function of the aortic valve prosthesis. Aortic valve mean gradient  measures 16.3 mmHg. Aortic valve Vmax measures 2.64 m/s.   6. The inferior vena cava is normal in size with greater than 50%  respiratory variability, suggesting right atrial pressure of 3 mmHg.   Comparison(s): No significant change from prior study.    2D Doppler echocardiogram 2022: IMPRESSIONS   1. Left ventricular ejection fraction, by estimation, is 60 to 65%. The  left ventricle has normal function. The left ventricle has no regional  wall motion abnormalities. There is mild left ventricular hypertrophy.  Left ventricular diastolic parameters  are consistent with Grade I diastolic dysfunction (impaired relaxation).  The average left ventricular global longitudinal strain is -19.9 %. The  global longitudinal strain is normal.   2. Right ventricular systolic function is normal. The right ventricular  size is normal.   3. Left atrial size was moderately dilated.   4. The mitral valve is normal in structure. No evidence of mitral valve  regurgitation. Mild mitral stenosis. The mean mitral valve gradient is 4.5  mmHg. Severe mitral annular calcification.   5. The aortic valve  has been repaired/replaced. Aortic valve  regurgitation is not visualized. No aortic stenosis is present. There is a  23 mm Edwards Sapien prosthetic (TAVR) valve present in the aortic  position. Echo findings are consistent with normal   structure and function of the aortic valve prosthesis. Aortic valve area,  by VTI measures 1.99 cm. Aortic valve mean gradient measures 15.0 mmHg.  Aortic valve Vmax measures 2.43 m/s.   6. The inferior vena cava is normal in size with greater than 50%  respiratory variability, suggesting right atrial pressure of 3 mmHg.   Comparison(s): No significant change from prior study. Prior images  reviewed side by side.   Assessment & Plan   1.  Acute on chronic diastolic CHF -weight today 178.  Generalized-1+ left greater than right stable lower extremity edema Continue torsemide , potassium Fluid restriction-48 ounces or less daily-again reviewed Daily weights-contact office with a weight increase of 2 to 3 pounds overnight or 5 pounds in 1 week-compliant with weight log Continue lower extremity support stockings Continue to elevate lower extremities when not active  Coronary artery disease-no recent episodes of arm neck back or chest discomfort.  Cardiac catheterization with PCI and DES to mid RCA 12/19. Continue current medical therapy Low-sodium diet Increase physical activity as tolerated  Paroxysmal atrial fibrillation-heart rate today 68.  No bleeding issues.  Continues to be compliant with apixaban.   Continue apixaban, diltiazem Avoid triggers caffeine, chocolate, EtOH, dehydration etc.-again reviewed  Aortic stenosis-status post TAVR.  No increased DOE.  Activity at baseline.  Previously functioning well on 07/17/2022 echocardiogram. Plan for repeat echocardiogram as scheduled  Disposition: Follow-up with Dr. Jacques Navy or me after echocardiogram.  Thomasene Ripple. Madsen Riddle NP-C     02/06/2023, 12:14 PM [facility] [address] Office [phone] Fax [phone]    I spent 14 minutes examining this patient, reviewing medications, and using patient centered shared decision making involving her cardiac care.  Prior to her visit I spent greater than 20 minutes reviewing her past medical history,  medications, and prior cardiac tests.

## 2023-02-04 NOTE — Telephone Encounter (Signed)
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  Reduced mysoline to once a day.    Mysoline reduced to one a night, and weaning off.  She will need a follow up for cervicalgia and this may be arthritis related, radiating to occiput and decreasing ROM.   Headaches are all over- more right sided, no nausea, no photophobia.   She was asked to ask about a visit with the STROKE, MD, which I will place for second opinion. DR Pearlean Brownie.   Melvyn Novas, MD

## 2023-02-04 NOTE — Telephone Encounter (Signed)
I have printed the notes for Dr Vickey Huger to review and will call pt once she has had a chance to review

## 2023-02-05 DIAGNOSIS — N3941 Urge incontinence: Secondary | ICD-10-CM | POA: Diagnosis not present

## 2023-02-06 ENCOUNTER — Ambulatory Visit: Payer: BLUE CROSS/BLUE SHIELD | Admitting: Gastroenterology

## 2023-02-06 ENCOUNTER — Encounter: Payer: Self-pay | Admitting: Hematology

## 2023-02-06 ENCOUNTER — Encounter: Payer: Self-pay | Admitting: General Practice

## 2023-02-06 ENCOUNTER — Ambulatory Visit: Payer: Medicare Other | Attending: General Practice | Admitting: General Practice

## 2023-02-06 VITALS — BP 104/60 | HR 68 | Ht 65.0 in | Wt 178.0 lb

## 2023-02-06 DIAGNOSIS — R600 Localized edema: Secondary | ICD-10-CM | POA: Insufficient documentation

## 2023-02-06 DIAGNOSIS — I5033 Acute on chronic diastolic (congestive) heart failure: Secondary | ICD-10-CM | POA: Insufficient documentation

## 2023-02-06 DIAGNOSIS — I350 Nonrheumatic aortic (valve) stenosis: Secondary | ICD-10-CM | POA: Insufficient documentation

## 2023-02-06 DIAGNOSIS — I480 Paroxysmal atrial fibrillation: Secondary | ICD-10-CM | POA: Insufficient documentation

## 2023-02-06 DIAGNOSIS — Z952 Presence of prosthetic heart valve: Secondary | ICD-10-CM | POA: Diagnosis not present

## 2023-02-06 DIAGNOSIS — I2510 Atherosclerotic heart disease of native coronary artery without angina pectoris: Secondary | ICD-10-CM | POA: Diagnosis not present

## 2023-02-06 NOTE — Telephone Encounter (Signed)
Pt called to offer appointment with Dr Pearlean Brownie, per Dr Vickey Huger. This is for a second opinion on stroke. Pt is coming for an appt on 02/07/2023 at 11:30 am. Pt advised to be here at 11:00 am for check in.

## 2023-02-06 NOTE — Patient Instructions (Addendum)
Medication Instructions:  The current medical regimen is effective;  continue present plan and medications as directed. Please refer to the Current Medication list given to you today. *If you need a refill on your cardiac medications before your next appointment, please call your pharmacy*  Lab Work: NONE  Testing/Procedures: Echocardiogram - Your physician has requested that you have an echocardiogram. Echocardiography is a painless test that uses sound waves to create images of your heart. It provides your doctor with information about the size and shape of your heart and how well your heart's chambers and valves are working. This procedure takes approximately one hour. There are no restrictions for this procedure.    Follow-Up: At [facility], you and your health needs are our priority.  As part of our continuing mission to provide you with exceptional heart care, we have created designated Provider Care Teams.  These Care Teams include your primary Cardiologist (physician) and Advanced Practice Providers (APPs -  Physician Assistants and Nurse Practitioners) who all work together to provide you with the care you need, when you need it.  Your next appointment:   6-8 month(s)  Provider:   Parke Poisson, MD     Other Instructions TAKE AND LOG YOUR WEIGHT DAILY CONTINUE FLUID RESTRICTION <48 OUNCES

## 2023-02-06 NOTE — Addendum Note (Signed)
Addended by: Judi Cong on: 02/06/2023 04:44 PM   Modules accepted: Orders

## 2023-02-07 ENCOUNTER — Encounter: Payer: Self-pay | Admitting: Hematology

## 2023-02-07 ENCOUNTER — Ambulatory Visit (INDEPENDENT_AMBULATORY_CARE_PROVIDER_SITE_OTHER): Payer: Medicare Other | Admitting: Neurology

## 2023-02-07 ENCOUNTER — Encounter: Payer: Self-pay | Admitting: Neurology

## 2023-02-07 VITALS — BP 112/53 | HR 64 | Ht 65.0 in | Wt 178.0 lb

## 2023-02-07 DIAGNOSIS — E0842 Diabetes mellitus due to underlying condition with diabetic polyneuropathy: Secondary | ICD-10-CM | POA: Diagnosis not present

## 2023-02-07 DIAGNOSIS — G214 Vascular parkinsonism: Secondary | ICD-10-CM

## 2023-02-07 DIAGNOSIS — Z8673 Personal history of transient ischemic attack (TIA), and cerebral infarction without residual deficits: Secondary | ICD-10-CM | POA: Diagnosis not present

## 2023-02-07 DIAGNOSIS — E0840 Diabetes mellitus due to underlying condition with diabetic neuropathy, unspecified: Secondary | ICD-10-CM

## 2023-02-07 DIAGNOSIS — E119 Type 2 diabetes mellitus without complications: Secondary | ICD-10-CM

## 2023-02-07 NOTE — Patient Instructions (Signed)
I had a long discussion with patient and her husband regarding her remote history of strokes and poststroke residual spastic left hemiparesis and mild vascular parkinsonism symptoms.  She seems significantly disabled by her arthritic symptoms and will benefit with evaluation for underlying rheumatological condition and referral to rheumatology.  Continue Eliquis for stroke prevention and maintain aggressive risk factor modification with strict control of diabetes with hemoglobin A1c goal below 6.5%, lipids with LDL cholesterol goal below 70 mg percent and hypertension with blood pressure goal below 130/90.  Check CT angiogram of brain and neck, MRI scan of the brain, lipid profile and hemoglobin A1c.  Continue follow-up with Dr. Vickey Huger for vascular parkinsonism symptoms and headache management.

## 2023-02-07 NOTE — Progress Notes (Signed)
[facility] [address] [place]. [place] [address] [phone]       OFFICE CONSULT NOTE  [name] Date of Birth:  1946-02-03 Medical Record Number:  [id number]   Referring MD: Dr. Vickey Huger  Reason for Referral: History of stroke and parkinsonism  HPI: Breanna Webster is a pleasant 77 year old Caucasian lady seen today for second opinion referral for history of stroke and parkinsonism from Dr. Vickey Huger.  Patient is accompanied by her husband today.  History is obtained from them and review of electronic medical records.  I personally reviewed pertinent available imaging films in PACS.  She has extensive past medical history.  She developed post TAVR stroke on 07/11/2018 and MRI showed multifocal infarcts in anterior and posterior circulation she also apparently developed atrial fibrillation.  She was left with residual left-sided weakness ataxia and developed tremor and parkinsonism symptoms subsequently.  She was following up with Dr. Vickey Huger for sleep apnea for several years.  Diagnosis of vascular parkinsonism was recently made.  She was initially tried on Mysoline for hand tremor without benefit and she even tried Sinemet for a few months but had side effects without significant benefit.  She was recently referred for second opinion to Dr. Westley Hummer at [facility] whom she saw 01/22/2023 who agreed with poststroke Parkinson's and the likely etiology for her tremors rather than essential tremor.  He recommended a repeat Sinemet trial but patient has not willing for it.  She even had a DaTscan on 08/14/2022 which was inconclusive and showed slight diminished uptake in posterior putamen and head of right caudate.  Recent MRI of the brain on 05/15/2022 showed old small left cerebellar infarct and old lacunar infarct in the right corona radiator and genu of the corpus callosum and left thalamus.  There is mild changes of small vessel disease  and generalized atrophy.  Patient has discontinued primidone.  Main complaint today is severe arthritis particularly on the left side of the body.  She is also developed some significant itching on her back and seems convinced that she is having some autoimmune condition which has not been diagnosed.  She has seen a rheumatologist several years ago but not one recently.  She and her husband feel there is a big rheumatology practice close to where they live and they want me to refer her to that practice and they will provide me the name and address after the reach home today.  The patient denies any focal new symptoms in recent years to suggest stroke or TIA.  She continues to have left-sided weakness and spasticity and diminished fine motor skills in the left hand.  She has trouble elevating the left shoulder due to mechanical pain likely from frozen shoulder Besides a tremor which is mostly in the left hand at rest and does not particularly worsen with position avoiding and intention.  Very rarely she has some resting tremor in the right hand.  She denies any significant bradykinesia, drooling of saliva, micrographia. ROS:   14 system review of systems is positive for tremors, stiffness, weakness, difficulty walking all other systems negative  PMH:  Past Medical History:  Diagnosis Date   Anxiety    Arthritis    "back" (04/22/2018)   Atrial fibrillation with RVR (HCC) 10/21/2018   Back pain    Bacteremia due to Streptococcus Salivarius 10/07/2018   Blood transfusion without reported diagnosis    CAD (coronary artery disease)    a. 03/2018 s/p PCI/DES  to the RCA (3.0x15 Onyx DES).   Carotid artery stenosis    Mild   Cerebellar stroke, acute (HCC) 10/21/2018   Cerebrovascular accident (CVA) due to embolism of right posterior cerebral artery (HCC) 08/03/2020   Chest pain    CHF (congestive heart failure) (HCC)    Chronic lower back pain    Cirrhosis (HCC)    Colon polyps    Diverticulitis     Diverticulosis    Esophageal thickening    seen on pre TAVR CT scan, also questionable cirrhosis. MRI recommended. Will refer to GI   GERD (gastroesophageal reflux disease)    Gout    Grave's disease    H/O total shoulder replacement, left 06/01/2022   Heart murmur    History of cardioembolic stroke 03/29/2020   History of colonic polyps 05/22/2017   History of hiatal hernia    Hyperlipidemia    Hypertension    Hypothyroidism    IBS (irritable bowel syndrome)    Osteopenia    Parkinson's syndrome (HCC) 04/23/2020   Pulmonary nodules    seen on pre TAVR CT. likley benign. no follow up recommended if pt low risk.   S/P TAVR (transcatheter aortic valve replacement)    Shortness of breath on exertion    Stroke (HCC) 2021   x2   Thalassemia minor    Thyroid disease    Type II diabetes mellitus (HCC)     Social History:  Social History   Socioeconomic History   Marital status: Married    Spouse name: Not on file   Number of children: 2   Years of education: Not on file   Highest education level: Associate degree: academic program  Occupational History   Occupation: Health and safety inspector of the [employer]  Tobacco Use   Smoking status: Never    Passive exposure: Never   Smokeless tobacco: Never  Vaping Use   Vaping status: Never Used  Substance and Sexual Activity   Alcohol use: No    Alcohol/week: 0.0 standard drinks of alcohol   Drug use: Never   Sexual activity: Not Currently  Other Topics Concern   Not on file  Social History Narrative   Lives with husband in a one story home.     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   Education: college.   Social Determinants of Health   Financial Resource Strain: Patient Declined (12/09/2022)   Overall Financial Resource Strain (CARDIA)    Difficulty of Paying Living Expenses: Patient declined  Food Insecurity: No Food Insecurity (12/09/2022)    Hunger Vital Sign    Worried About Running Out of Food in the Last Year: Never true    Ran Out of Food in the Last Year: Never true  Transportation Needs: Unknown (12/09/2022)   PRAPARE - Transportation    Lack of Transportation (Medical): No    Lack of Transportation (Non-Medical): Patient declined  Physical Activity: Unknown (12/09/2022)   Exercise Vital Sign    Days of Exercise per Week: Patient declined    Minutes of Exercise per Session: 40 min  Stress: No Stress Concern Present (12/09/2022)   [employer] of Occupational Health - Occupational Stress Questionnaire    Feeling of Stress : Only a little  Social Connections: Unknown (12/09/2022)   Social Connection and Isolation Panel [NHANES]    Frequency of Communication with Friends and Family: More than three times a week    Frequency of  Social Gatherings with Friends and Family: Patient declined    Attends Religious Services: Patient declined    Database administrator or Organizations: Patient declined    Attends Engineer, structural: More than 4 times per year    Marital Status: Married  Catering manager Violence: Not At Risk (10/16/2022)   Humiliation, Afraid, Rape, and Kick questionnaire    Fear of Current or Ex-Partner: No    Emotionally Abused: No    Physically Abused: No    Sexually Abused: No    Medications:   Current Outpatient Medications on File Prior to Visit  Medication Sig Dispense Refill   alendronate (FOSAMAX) 70 MG tablet Take 1 tablet (70 mg total) by mouth once a week. 12 tablet 3   amitriptyline (ELAVIL) 25 MG tablet Take 1 tablet (25 mg total) by mouth at bedtime. 90 tablet 1   apixaban (ELIQUIS) 5 MG TABS tablet Take 1 tablet (5 mg total) by mouth 2 (two) times daily. 60 tablet 0   b complex vitamins capsule Take 1 capsule by mouth daily.     BD PEN NEEDLE NANO 2ND GEN 32G X 4 MM MISC      colchicine 0.6 MG tablet Take one tablet by mouth daily until gout flare is resolved 20 tablet 1    diltiazem (CARTIA XT) 120 MG 24 hr capsule Take 1 capsule (120 mg total) by mouth daily. 90 capsule 3   Insulin NPH, Human,, Isophane, (HUMULIN N KWIKPEN) 100 UNIT/ML Kiwkpen Inject 20 units into the skin every morning and 14 units every evening. 15 mL 6   Insulin NPH, Human,, Isophane, (HUMULIN N KWIKPEN) 100 UNIT/ML Kiwkpen Inject 20 units into the skin every morning and 14 units every evening. daily 30 days 15 mL 3   Insulin Pen Needle (UNIFINE PENTIPS) 32G X 4 MM MISC Inject 4 times subcutaneously 400 each 2   Insulin Syringe-Needle U-100 (ULTICARE INSULIN SYRINGE) 31G X 5/16" 1 ML MISC Inject 4 times daily subcutaneously 100 each 1   metFORMIN (GLUCOPHAGE-XR) 500 MG 24 hr tablet Take 1 tablet by mouth 2 times daily 180 tablet 2   Multiple Vitamin (MULITIVITAMIN WITH MINERALS) TABS Take 1 tablet by mouth daily.     nitroGLYCERIN (NITROSTAT) 0.4 MG SL tablet Place 1 tablet (0.4 mg total) under the tongue every 5 (five) minutes as needed for chest pain. 25 tablet 4   omeprazole (PRILOSEC) 20 MG capsule Take 1 capsule (20 mg total) by mouth daily. Please keep your August appointment for further refills. Thank you 90 capsule 0   polyethylene glycol (MIRALAX) 17 g packet Take 17 g by mouth daily as needed.     potassium chloride SA (KLOR-CON M) 20 MEQ tablet Take 1 tablet (20 mEq total) by mouth daily. Take in concurrence with torsemide. 40 tablet 0   SYNTHROID 137 MCG tablet Take 1 tablet (137 mcg total) by mouth every morning on empty stomach. 90 tablet 1   tiZANidine (ZANAFLEX) 2 MG tablet Take 1 tablet (2 mg total) by mouth 2 (two) times daily as needed. 60 tablet 0   tiZANidine (ZANAFLEX) 4 MG tablet Take 1 tablet (4 mg total) by mouth 3 (three) times daily. 90 tablet 1   torsemide (DEMADEX) 20 MG tablet Take 2 tablets (40 mg total) by mouth daily. 60 tablet 6   Vibegron (GEMTESA) 75 MG TABS Take 75 mg by mouth at bedtime.     amoxicillin (AMOXIL) 500 MG capsule Take 2 capsules by mouth  1 hour  before dentist or other procedures and 2 capsules by mouth after visit (Patient not taking: Reported on 02/06/2023) 4 capsule 4   cholecalciferol (VITAMIN D3) 25 MCG (1000 UNIT) tablet Take 1,000 Units by mouth daily. (Patient not taking: Reported on 02/06/2023)     HYDROcodone-acetaminophen (NORCO/VICODIN) 5-325 MG tablet Take 1 tablet by mouth every 4 (four) hours as needed for moderate pain or severe pain. (Patient not taking: Reported on 02/06/2023) 30 tablet 0   insulin NPH Human (HUMULIN N) 100 UNIT/ML injection Inject 20 units under the skin in the morning and 14 units in evening (Patient not taking: Reported on 02/06/2023) 20 mL 5   insulin regular (HUMULIN R) 100 units/mL injection Inject 0.1 mLs (10 Units total) into the skin 3 (three) times daily before meals (Patient not taking: Reported on 02/06/2023) 10 mL 3   rivaroxaban (XARELTO) 20 MG TABS tablet Take 1 tablet (20 mg total) by mouth daily with supper. (Patient not taking: Reported on 02/06/2023) 30 tablet 5   No current facility-administered medications on file prior to visit.    Allergies:  No Known Allergies  Physical Exam General: well developed, well nourished pleasant elderly Caucasian lady, seated, in no evident distress Head: head normocephalic and atraumatic.   Neck: supple with no carotid or supraclavicular bruits Cardiovascular: regular rate and rhythm, no murmurs Musculoskeletal: Left shoulder elevation limited due to mechanical pain.   Skin:  no rash/petichiae Vascular:  Normal pulses all extremities  Neurologic Exam Mental Status: Awake and fully alert. Oriented to place and time. Recent and remote memory intact. Attention span, concentration and fund of knowledge appropriate. Mood and affect appropriate.  No diminished facial expression.  Glabellar tap is negative.  Mild dysarthria.  No hypophonia.  No aphasia. Cranial Nerves: Fundoscopic exam reveals sharp disc margins. Pupils equal, briskly reactive to light.  Extraocular movements full without nystagmus. Visual fields full to confrontation. Hearing intact. Facial sensation intact.  Mild left facial asymmetry when she smiles., tongue, palate moves normally and symmetrically.  Motor: Spastic left hemiparesis with significant weakness of left grip and intrinsic hand muscles.  Left shoulder elevation limited due to mechanical pain.  Mild left hip flexor ankle dorsiflexor weakness.  Tone is increased on the left with mild spasticity.  No cogwheel rigidity.  Mild intermittent resting tremor in the left hand which improves with position holding and intention.  No tremor noted in the right arm. Sensory.: intact to touch , pinprick , position and vibratory sensation.  The left hand tremor is not typically pill-rolling. Coordination: Rapid alternating movements normal in all extremities. Finger-to-nose and heel-to-shin performed accurately bilaterally. Gait and Station: Deferred as patient is wheelchair-bound and nonambulatory.   Reflexes: 2+ and asymmetric and brisker on the left. Toes downgoing.   NIHSS  4 Modified Rankin  4   ASSESSMENT: 77 year old lady with remote history of bicerebral infarcts following new onset atrial fibrillation after developing endocarditis following TAVR procedure in 2020 with residual mild spastic left hemiparesis and tremors likely from mild poststroke parkinsonism.  I do not think her tremor is essential tremor.     PLAN:I had a long discussion with patient and her husband regarding her remote history of strokes and poststroke residual spastic left hemiparesis and mild vascular parkinsonism symptoms.  She seems significantly disabled by her arthritic symptoms and will benefit with evaluation for underlying rheumatological condition and referral to rheumatology as this seems to be her most disabling problem at this time..  Continue Eliquis for  stroke prevention for her atrial fibrillation and maintain aggressive risk factor  modification with strict control of diabetes with hemoglobin A1c goal below 6.5%, lipids with LDL cholesterol goal below 70 mg percent and hypertension with blood pressure goal below 130/90.  Check CT angiogram of brain and neck, MRI scan of the brain, lipid profile and hemoglobin A1c.  Continue follow-up with Dr. Vickey Huger for vascular parkinsonism symptoms and headache management. Greater than 50% time during this 45-minute consultation visit was spent in counseling and coordination of care about her remote stroke and post stroke spastic MI paresis and mild parkinsonian symptoms and answering questions. Delia Heady, MD Note: This document was prepared with digital dictation and possible smart phrase technology. Any transcriptional errors that result from this process are unintentional.

## 2023-02-08 ENCOUNTER — Other Ambulatory Visit: Payer: Self-pay | Admitting: Neurology

## 2023-02-08 ENCOUNTER — Other Ambulatory Visit (HOSPITAL_BASED_OUTPATIENT_CLINIC_OR_DEPARTMENT_OTHER): Payer: Self-pay

## 2023-02-08 LAB — LIPID PANEL
Chol/HDL Ratio: 3.8 {ratio} (ref 0.0–4.4)
Cholesterol, Total: 177 mg/dL (ref 100–199)
HDL: 46 mg/dL (ref >39)
LDL Chol Calc (NIH): 105 mg/dL — ABNORMAL HIGH (ref 0–99)
Triglycerides: 150 mg/dL — ABNORMAL HIGH (ref 0–149)
VLDL Cholesterol Cal: 26 mg/dL (ref 5–40)

## 2023-02-08 LAB — HEMOGLOBIN A1C
Est. average glucose Bld gHb Est-mCnc: 131 mg/dL
Hgb A1c MFr Bld: 6.2 % — ABNORMAL HIGH (ref 4.8–5.6)

## 2023-02-08 MED ORDER — SIMVASTATIN 10 MG PO TABS
20.0000 mg | ORAL_TABLET | Freq: Every day | ORAL | 3 refills | Status: DC
  Filled 2023-02-08: qty 30, 15d supply, fill #0

## 2023-02-08 NOTE — Progress Notes (Signed)
Kindly inform the patient that screening test for diabetes control is satisfactory though she has slightly increased bad cholesterol and I recommend she start taking Zocor 20 mg daily.

## 2023-02-11 ENCOUNTER — Other Ambulatory Visit (HOSPITAL_BASED_OUTPATIENT_CLINIC_OR_DEPARTMENT_OTHER): Payer: Self-pay

## 2023-02-11 ENCOUNTER — Telehealth: Payer: Self-pay | Admitting: Anesthesiology

## 2023-02-11 NOTE — Telephone Encounter (Signed)
-----   Message from Delia Heady sent at 02/08/2023  9:21 AM EDT ----- Breanna Webster inform the patient that screening test for diabetes control is satisfactory though she has slightly increased bad cholesterol and I recommend she start taking Zocor 20 mg daily.

## 2023-02-12 ENCOUNTER — Telehealth: Payer: Self-pay | Admitting: Neurology

## 2023-02-12 ENCOUNTER — Other Ambulatory Visit: Payer: Self-pay | Admitting: Neurology

## 2023-02-12 ENCOUNTER — Other Ambulatory Visit: Payer: Self-pay

## 2023-02-12 DIAGNOSIS — G214 Vascular parkinsonism: Secondary | ICD-10-CM

## 2023-02-12 DIAGNOSIS — I63423 Cerebral infarction due to embolism of bilateral anterior cerebral arteries: Secondary | ICD-10-CM

## 2023-02-12 DIAGNOSIS — M1990 Unspecified osteoarthritis, unspecified site: Secondary | ICD-10-CM

## 2023-02-12 NOTE — Progress Notes (Signed)
I wrote for referral to rheumatology. Please attach all requested  information as specified in phone conversation.

## 2023-02-12 NOTE — Telephone Encounter (Signed)
[facility] Rheumatology patient dropped off last office visit note request for Rheumatology Pt would like referral sent to [name]. We would need any imaging, demographics, insurance card, office visit notes, Labs.

## 2023-02-13 ENCOUNTER — Telehealth: Payer: Self-pay | Admitting: Neurology

## 2023-02-13 ENCOUNTER — Other Ambulatory Visit (HOSPITAL_BASED_OUTPATIENT_CLINIC_OR_DEPARTMENT_OTHER): Payer: Self-pay

## 2023-02-13 NOTE — Telephone Encounter (Signed)
Referral for rheumatology fax to [facility] to see Dr.  Alben Deeds. Phone: [phone], Fax: [phone]

## 2023-02-13 NOTE — Telephone Encounter (Signed)
no auth required sent to [facility] because she is in a wheelchair. [phone]

## 2023-02-18 ENCOUNTER — Other Ambulatory Visit: Payer: Self-pay | Admitting: Adult Health

## 2023-02-18 ENCOUNTER — Other Ambulatory Visit (HOSPITAL_BASED_OUTPATIENT_CLINIC_OR_DEPARTMENT_OTHER): Payer: Self-pay

## 2023-02-18 ENCOUNTER — Telehealth: Payer: Self-pay | Admitting: Adult Health

## 2023-02-18 MED ORDER — ELIQUIS 5 MG PO TABS
5.0000 mg | ORAL_TABLET | Freq: Two times a day (BID) | ORAL | 0 refills | Status: DC
  Filled 2023-02-18 (×2): qty 60, 30d supply, fill #0

## 2023-02-18 NOTE — Telephone Encounter (Signed)
Prescription Request  02/18/2023  LOV: 01/17/2023  What is the name of the medication or equipment? Eliquis apixaban (ELIQUIS) 5 MG TABS tablet  Have you contacted your pharmacy to request a refill? Yes   Pharmacy called for this refill, on Pt's behalf  Which pharmacy would you like this sent to?  [facility] [address] [place] [address] Phone: [phone] Fax: [phone]    Patient notified that their request is being sent to the clinical staff for review and that they should receive a response within 2 business days.   Please advise at Mobile [phone] (mobile)

## 2023-02-18 NOTE — Telephone Encounter (Signed)
I received a message from [facility] that she wants to get her scans done at GI.

## 2023-02-19 ENCOUNTER — Other Ambulatory Visit (HOSPITAL_BASED_OUTPATIENT_CLINIC_OR_DEPARTMENT_OTHER): Payer: Self-pay

## 2023-02-19 NOTE — Telephone Encounter (Signed)
Pharmacy says they did an emergency fill for patient because she was distraught and crying. Asking that refills be added to rx

## 2023-02-20 ENCOUNTER — Other Ambulatory Visit (HOSPITAL_BASED_OUTPATIENT_CLINIC_OR_DEPARTMENT_OTHER): Payer: Self-pay

## 2023-02-20 DIAGNOSIS — M25512 Pain in left shoulder: Secondary | ICD-10-CM | POA: Diagnosis not present

## 2023-02-20 DIAGNOSIS — R5383 Other fatigue: Secondary | ICD-10-CM | POA: Diagnosis not present

## 2023-02-20 DIAGNOSIS — M79641 Pain in right hand: Secondary | ICD-10-CM | POA: Diagnosis not present

## 2023-02-20 DIAGNOSIS — M79672 Pain in left foot: Secondary | ICD-10-CM | POA: Diagnosis not present

## 2023-02-20 DIAGNOSIS — M79671 Pain in right foot: Secondary | ICD-10-CM | POA: Diagnosis not present

## 2023-02-20 DIAGNOSIS — M2540 Effusion, unspecified joint: Secondary | ICD-10-CM | POA: Diagnosis not present

## 2023-02-20 DIAGNOSIS — Z6829 Body mass index (BMI) 29.0-29.9, adult: Secondary | ICD-10-CM | POA: Diagnosis not present

## 2023-02-20 DIAGNOSIS — M2560 Stiffness of unspecified joint, not elsewhere classified: Secondary | ICD-10-CM | POA: Diagnosis not present

## 2023-02-20 DIAGNOSIS — M79642 Pain in left hand: Secondary | ICD-10-CM | POA: Diagnosis not present

## 2023-02-20 DIAGNOSIS — Z79899 Other long term (current) drug therapy: Secondary | ICD-10-CM | POA: Diagnosis not present

## 2023-02-20 DIAGNOSIS — E569 Vitamin deficiency, unspecified: Secondary | ICD-10-CM | POA: Diagnosis not present

## 2023-02-20 DIAGNOSIS — L299 Pruritus, unspecified: Secondary | ICD-10-CM | POA: Diagnosis not present

## 2023-02-20 NOTE — Telephone Encounter (Signed)
This Rx was already sent in a couple of days ago. Will call the pharmacy to see what's going on.

## 2023-02-20 NOTE — Telephone Encounter (Signed)
spoke to pharmacist and she stated that the refill request was from her as an emergency. Ok to send this in for the rest of the year? Please advise

## 2023-02-21 ENCOUNTER — Other Ambulatory Visit: Payer: Self-pay

## 2023-02-21 ENCOUNTER — Other Ambulatory Visit (HOSPITAL_BASED_OUTPATIENT_CLINIC_OR_DEPARTMENT_OTHER): Payer: Self-pay

## 2023-02-21 DIAGNOSIS — S42032A Displaced fracture of lateral end of left clavicle, initial encounter for closed fracture: Secondary | ICD-10-CM | POA: Diagnosis not present

## 2023-02-21 DIAGNOSIS — Z96612 Presence of left artificial shoulder joint: Secondary | ICD-10-CM | POA: Diagnosis not present

## 2023-02-21 DIAGNOSIS — D509 Iron deficiency anemia, unspecified: Secondary | ICD-10-CM

## 2023-02-21 MED ORDER — APIXABAN 5 MG PO TABS
5.0000 mg | ORAL_TABLET | Freq: Two times a day (BID) | ORAL | 3 refills | Status: DC
  Filled 2023-02-21 – 2023-03-18 (×2): qty 60, 30d supply, fill #0

## 2023-02-21 NOTE — Telephone Encounter (Signed)
Rx refilled.

## 2023-02-25 ENCOUNTER — Inpatient Hospital Stay (HOSPITAL_BASED_OUTPATIENT_CLINIC_OR_DEPARTMENT_OTHER): Payer: Medicare Other | Admitting: Hematology

## 2023-02-25 ENCOUNTER — Inpatient Hospital Stay: Payer: Medicare Other | Attending: Hematology

## 2023-02-25 VITALS — BP 142/70 | HR 87 | Temp 97.9°F | Resp 18

## 2023-02-25 DIAGNOSIS — D509 Iron deficiency anemia, unspecified: Secondary | ICD-10-CM | POA: Insufficient documentation

## 2023-02-25 DIAGNOSIS — D563 Thalassemia minor: Secondary | ICD-10-CM

## 2023-02-25 LAB — IRON AND IRON BINDING CAPACITY (CC-WL,HP ONLY)
Iron: 60 ug/dL (ref 28–170)
Saturation Ratios: 14 % (ref 10.4–31.8)
TIBC: 435 ug/dL (ref 250–450)
UIBC: 375 ug/dL (ref 148–442)

## 2023-02-25 LAB — CBC WITH DIFFERENTIAL (CANCER CENTER ONLY)
Abs Immature Granulocytes: 0.01 10*3/uL (ref 0.00–0.07)
Basophils Absolute: 0.1 10*3/uL (ref 0.0–0.1)
Basophils Relative: 1 %
Eosinophils Absolute: 0.2 10*3/uL (ref 0.0–0.5)
Eosinophils Relative: 4 %
HCT: 34.6 % — ABNORMAL LOW (ref 36.0–46.0)
Hemoglobin: 10.7 g/dL — ABNORMAL LOW (ref 12.0–15.0)
Immature Granulocytes: 0 %
Lymphocytes Relative: 29 %
Lymphs Abs: 1.4 10*3/uL (ref 0.7–4.0)
MCH: 19.2 pg — ABNORMAL LOW (ref 26.0–34.0)
MCHC: 30.9 g/dL (ref 30.0–36.0)
MCV: 62.1 fL — ABNORMAL LOW (ref 80.0–100.0)
Monocytes Absolute: 0.4 10*3/uL (ref 0.1–1.0)
Monocytes Relative: 8 %
Neutro Abs: 2.8 10*3/uL (ref 1.7–7.7)
Neutrophils Relative %: 58 %
Platelet Count: 163 10*3/uL (ref 150–400)
RBC: 5.57 MIL/uL — ABNORMAL HIGH (ref 3.87–5.11)
RDW: 15.9 % — ABNORMAL HIGH (ref 11.5–15.5)
WBC Count: 4.8 10*3/uL (ref 4.0–10.5)
nRBC: 0 % (ref 0.0–0.2)

## 2023-02-25 LAB — FERRITIN: Ferritin: 12 ng/mL (ref 11–307)

## 2023-02-25 NOTE — Progress Notes (Signed)
HEMATOLOGY/ONCOLOGY CLINIC NOTE  Date of Service: 02/25/2023  Patient Care Team: Shirline Frees, NP as PCP - General (Family Medicine) Parke Poisson, MD as PCP - Cardiology (Cardiology) Marlene Lard, MD as Consulting Physician (Endocrinology) Micki Riley, MD as Referring Physician (Neurology) Verner Chol, [facility] (Inactive) as Pharmacist (Pharmacist)  REFERRING PHYSICIAN: Shirline Frees, NP  CHIEF COMPLAINTS/PURPOSE OF CONSULTATION:    Beta Thalassemia minor IDA  HISTORY OF PRESENTING ILLNESS:  Please see previous note for details on initial presentation  INTERVAL HISTORY: Breanna Webster is a 77 y.o. female here for evaluation and management of BetaThalassemia minor and IDA.   Patient was last seen by me on 02/26/2022 and she complained of persistent fatigue, lethargy, and bilateral leg swelling.   Patient is accompanied by her husband during this visit. Patient complains of left shoulder pain and knee pain. She had shoulder surgery but patient notes that her surgery did not help her pain.   Patient also complains of insomnia for the past couple of days and complains of bone pain throughout her body.   She was taking Fosamax 70 mg and one of her physician told her to discontinue the medication.   She denies any new infection issues, fever, chills, night sweats, chest pain, abdominal pain, or leg swelling.   Patient's last bone density was around 2 years ago and was diagnosed with osteopenia. According to the patient, she has been recently diagnosed with osteoporosis as well.   MEDICAL HISTORY:  Past Medical History:  Diagnosis Date   Anxiety    Arthritis    "back" (04/22/2018)   Atrial fibrillation with RVR (HCC) 10/21/2018   Back pain    Bacteremia due to Streptococcus Salivarius 10/07/2018   Blood transfusion without reported diagnosis    CAD (coronary artery disease)    a. 03/2018 s/p PCI/DES to the RCA (3.0x15 Onyx DES).   Carotid artery  stenosis    Mild   Cerebellar stroke, acute (HCC) 10/21/2018   Cerebrovascular accident (CVA) due to embolism of right posterior cerebral artery (HCC) 08/03/2020   Chest pain    CHF (congestive heart failure) (HCC)    Chronic lower back pain    Cirrhosis (HCC)    Colon polyps    Diverticulitis    Diverticulosis    Esophageal thickening    seen on pre TAVR CT scan, also questionable cirrhosis. MRI recommended. Will refer to GI   GERD (gastroesophageal reflux disease)    Gout    Grave's disease    H/O total shoulder replacement, left 06/01/2022   Heart murmur    History of cardioembolic stroke 03/29/2020   History of colonic polyps 05/22/2017   History of hiatal hernia    Hyperlipidemia    Hypertension    Hypothyroidism    IBS (irritable bowel syndrome)    Osteopenia    Parkinson's syndrome (HCC) 04/23/2020   Pulmonary nodules    seen on pre TAVR CT. likley benign. no follow up recommended if pt low risk.   S/P TAVR (transcatheter aortic valve replacement)    Shortness of breath on exertion    Stroke (HCC) 2021   x2   Thalassemia minor    Thyroid disease    Type II diabetes mellitus (HCC)      SURGICAL HISTORY: Past Surgical History:  Procedure Laterality Date   61 HOUR PH STUDY N/A 03/03/2018   Procedure: 24 HOUR PH STUDY;  Surgeon: Napoleon Form, MD;  Location: [facility] ENDOSCOPY;  Service:  Endoscopy;  Laterality: N/A;   COLONOSCOPY  2023   COLONOSCOPY W/ BIOPSIES AND POLYPECTOMY     CORONARY ANGIOGRAPHY Right 04/21/2018   Procedure: CORONARY ANGIOGRAPHY (CATH LAB);  Surgeon: Lyn Records, MD;  Location: [facility] INVASIVE CV LAB;  Service: Cardiovascular;  Laterality: Right;   CORONARY STENT INTERVENTION N/A 04/22/2018   Procedure: CORONARY STENT INTERVENTION;  Surgeon: Lyn Records, MD;  Location: [facility] INVASIVE CV LAB;  Service: Cardiovascular;  Laterality: N/A;   DILATION AND CURETTAGE OF UTERUS     ESOPHAGEAL MANOMETRY N/A 03/03/2018   Procedure: ESOPHAGEAL  MANOMETRY (EM);  Surgeon: Napoleon Form, MD;  Location: [facility] ENDOS[facility];  Service: Endoscopy;  Laterality: N/A;   HYSTEROSCOPY     fibroids   LAPAROSCOPIC CHOLECYSTECTOMY  1985   LAPAROSCOPY     fibroids   NISSEN FUNDOPLICATION  1990s   POLYPECTOMY     REVERSE SHOULDER ARTHROPLASTY Left 06/01/2022   Procedure: REVERSE SHOULDER ARTHROPLASTY;  Surgeon: Beverely Low, MD;  Location: [facility] ORS;  Service: Orthopedics;  Laterality: Left;  choice with interscalene block   RIGHT/LEFT HEART CATH AND CORONARY ANGIOGRAPHY N/A 02/20/2018   Procedure: RIGHT/LEFT HEART CATH AND CORONARY ANGIOGRAPHY;  Surgeon: Lyn Records, MD;  Location: [facility] INVASIVE CV LAB;  Service: Cardiovascular;  Laterality: N/A;   TEE WITHOUT CARDIOVERSION N/A 07/08/2018   Procedure: TRANSESOPHAGEAL ECHOCARDIOGRAM (TEE);  Surgeon: Kathleene Hazel, MD;  Location: [facility] OR;  Service: Open Heart Surgery;  Laterality: N/A;   TEE WITHOUT CARDIOVERSION  10/07/2018   TEE WITHOUT CARDIOVERSION N/A 10/07/2018   Procedure: TRANSESOPHAGEAL ECHOCARDIOGRAM (TEE);  Surgeon: Jake Bathe, MD;  Location: [facility] ENDOSCOPY;  Service: Cardiovascular;  Laterality: N/A;   TONSILLECTOMY     age 27   TRANSCATHETER AORTIC VALVE REPLACEMENT, TRANSFEMORAL N/A 07/08/2018   Procedure: TRANSCATHETER AORTIC VALVE REPLACEMENT, TRANSFEMORAL;  Surgeon: Kathleene Hazel, MD;  Location: [facility] OR;  Service: Open Heart Surgery;  Laterality: N/A;     SOCIAL HISTORY: Social History   Socioeconomic History   Marital status: Married    Spouse name: Not on file   Number of children: 2   Years of education: Not on file   Highest education level: Associate degree: academic program  Occupational History   Occupation: Health and safety inspector of the [employer]  Tobacco Use   Smoking status: Never    Passive exposure: Never   Smokeless tobacco: Never  Vaping Use   Vaping status: Never Used  Substance and Sexual Activity   Alcohol use: No     Alcohol/week: 0.0 standard drinks of alcohol   Drug use: Never   Sexual activity: Not Currently  Other Topics Concern   Not on file  Social History Narrative   Lives with husband in a one story home.     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   Education: college.   Social Determinants of Health   Financial Resource Strain: Patient Declined (12/09/2022)   Overall Financial Resource Strain (CARDIA)    Difficulty of Paying Living Expenses: Patient declined  Food Insecurity: No Food Insecurity (12/09/2022)   Hunger Vital Sign    Worried About Running Out of Food in the Last Year: Never true    Ran Out of Food in the Last Year: Never true  Transportation Needs: Unknown (12/09/2022)   PRAPARE - Administrator, Civil Service (Medical): No    Lack of Transportation (Non-Medical): Patient declined  Physical  Activity: Unknown (12/09/2022)   Exercise Vital Sign    Days of Exercise per Week: Patient declined    Minutes of Exercise per Session: 40 min  Stress: No Stress Concern Present (12/09/2022)   [employer] of Occupational Health - Occupational Stress Questionnaire    Feeling of Stress : Only a little  Social Connections: Unknown (12/09/2022)   Social Connection and Isolation Panel [NHANES]    Frequency of Communication with Friends and Family: More than three times a week    Frequency of Social Gatherings with Friends and Family: Patient declined    Attends Religious Services: Patient declined    Database administrator or Organizations: Patient declined    Attends Engineer, structural: More than 4 times per year    Marital Status: Married  Catering manager Violence: Not At Risk (10/16/2022)   Humiliation, Afraid, Rape, and Kick questionnaire    Fear of Current or Ex-Partner: No    Emotionally Abused: No    Physically Abused: No    Sexually Abused: No     FAMILY HISTORY: Family History   Adopted: Yes  Problem Relation Age of Onset   Healthy Son        x 2   Headache Other        Cluster headaches   Heart failure Mother    Colon cancer Neg Hx    Pancreatic cancer Neg Hx    Rectal cancer Neg Hx    Stomach cancer Neg Hx    ALLERGIES:   has No Known Allergies.   MEDICATIONS:  Current Outpatient Medications  Medication Sig Dispense Refill   alendronate (FOSAMAX) 70 MG tablet Take 1 tablet (70 mg total) by mouth once a week. 12 tablet 3   amitriptyline (ELAVIL) 25 MG tablet Take 1 tablet (25 mg total) by mouth at bedtime. 90 tablet 1   amoxicillin (AMOXIL) 500 MG capsule Take 2 capsules by mouth 1 hour before dentist or other procedures and 2 capsules by mouth after visit (Patient not taking: Reported on 02/06/2023) 4 capsule 4   apixaban (ELIQUIS) 5 MG TABS tablet Take 1 tablet (5 mg total) by mouth 2 (two) times daily. 60 tablet 3   b complex vitamins capsule Take 1 capsule by mouth daily.     BD PEN NEEDLE NANO 2ND GEN 32G X 4 MM MISC      cholecalciferol (VITAMIN D3) 25 MCG (1000 UNIT) tablet Take 1,000 Units by mouth daily. (Patient not taking: Reported on 02/06/2023)     colchicine 0.6 MG tablet Take one tablet by mouth daily until gout flare is resolved 20 tablet 1   diltiazem (CARTIA XT) 120 MG 24 hr capsule Take 1 capsule (120 mg total) by mouth daily. 90 capsule 3   HYDROcodone-acetaminophen (NORCO/VICODIN) 5-325 MG tablet Take 1 tablet by mouth every 4 (four) hours as needed for moderate pain or severe pain. (Patient not taking: Reported on 02/06/2023) 30 tablet 0   insulin NPH Human (HUMULIN N) 100 UNIT/ML injection Inject 20 units under the skin in the morning and 14 units in evening (Patient not taking: Reported on 02/06/2023) 20 mL 5   Insulin NPH, Human,, Isophane, (HUMULIN N KWIKPEN) 100 UNIT/ML Kiwkpen Inject 20 units into the skin every morning and 14 units every evening. 15 mL 6   Insulin NPH, Human,, Isophane, (HUMULIN N KWIKPEN) 100 UNIT/ML Kiwkpen  Inject 20 units into the skin every morning and 14 units every evening. daily 30 days 15 mL 3  Insulin Pen Needle (UNIFINE PENTIPS) 32G X 4 MM MISC Inject 4 times subcutaneously 400 each 2   insulin regular (HUMULIN R) 100 units/mL injection Inject 0.1 mLs (10 Units total) into the skin 3 (three) times daily before meals (Patient not taking: Reported on 02/06/2023) 10 mL 3   Insulin Syringe-Needle U-100 (ULTICARE INSULIN SYRINGE) 31G X 5/16" 1 ML MISC Inject 4 times daily subcutaneously 100 each 1   metFORMIN (GLUCOPHAGE-XR) 500 MG 24 hr tablet Take 1 tablet by mouth 2 times daily 180 tablet 2   Multiple Vitamin (MULITIVITAMIN WITH MINERALS) TABS Take 1 tablet by mouth daily.     nitroGLYCERIN (NITROSTAT) 0.4 MG SL tablet Place 1 tablet (0.4 mg total) under the tongue every 5 (five) minutes as needed for chest pain. 25 tablet 4   omeprazole (PRILOSEC) 20 MG capsule Take 1 capsule (20 mg total) by mouth daily. Please keep your August appointment for further refills. Thank you 90 capsule 0   polyethylene glycol (MIRALAX) 17 g packet Take 17 g by mouth daily as needed.     potassium chloride SA (KLOR-CON M) 20 MEQ tablet Take 1 tablet (20 mEq total) by mouth daily. Take in concurrence with torsemide. 40 tablet 0   rivaroxaban (XARELTO) 20 MG TABS tablet Take 1 tablet (20 mg total) by mouth daily with supper. (Patient not taking: Reported on 02/06/2023) 30 tablet 5   simvastatin (ZOCOR) 10 MG tablet Take 2 tablets (20 mg total) by mouth daily. 30 tablet 3   SYNTHROID 137 MCG tablet Take 1 tablet (137 mcg total) by mouth every morning on empty stomach. 90 tablet 1   tiZANidine (ZANAFLEX) 2 MG tablet Take 1 tablet (2 mg total) by mouth 2 (two) times daily as needed. 60 tablet 0   tiZANidine (ZANAFLEX) 4 MG tablet Take 1 tablet (4 mg total) by mouth 3 (three) times daily. 90 tablet 1   torsemide (DEMADEX) 20 MG tablet Take 2 tablets (40 mg total) by mouth daily. 60 tablet 6   Vibegron (GEMTESA) 75 MG TABS  Take 75 mg by mouth at bedtime.     No current facility-administered medications for this visit.     REVIEW OF SYSTEMS:   10 Point review of Systems was done is negative except as noted above.  PHYSICAL EXAMINATION: Vitals:   02/25/23 1244  BP: (!) 142/70  Pulse: 87  Resp: 18  Temp: 97.9 F (36.6 C)  SpO2: 100%     Wt Readings from Last 3 Encounters:  02/07/23 178 lb (80.7 kg)  02/06/23 178 lb (80.7 kg)  10/18/22 180 lb 11.2 oz (82 kg)   There is no height or weight on file to calculate BMI.  NAD GENERAL:alert, in no acute distress and comfortable SKIN: no acute rashes, no significant lesions EYES: conjunctiva are pink and non-injected, sclera anicteric NECK: supple, no JVD LYMPH:  no palpable lymphadenopathy in the cervical, axillary or inguinal regions LUNGS: clear to auscultation b/l with normal respiratory effort HEART: regular rate & rhythm ABDOMEN:  normoactive bowel sounds , non tender, not distended. Extremity: no pedal edema PSYCH: alert & oriented x 3 with fluent speech NEURO: no focal motor/sensory deficits  Exam performed in chair.  LABORATORY DATA:  I have reviewed the data as listed     Latest Ref Rng & Units 02/25/2023   12:13 PM 01/17/2023    4:07 PM 10/17/2022    4:54 AM  CBC  WBC 4.0 - 10.5 K/uL 4.8  6.0  6.5  Hemoglobin 12.0 - 15.0 g/dL 44.0  10.2  9.8   Hematocrit 36.0 - 46.0 % 34.6  33.5  31.6   Platelets 150 - 400 K/uL 163  202.0  149    . CBC    Component Value Date/Time   WBC 4.8 02/25/2023 1213   WBC 6.0 01/17/2023 1607   RBC 5.57 (H) 02/25/2023 1213   HGB 10.7 (L) 02/25/2023 1213   HGB 10.4 (L) 07/24/2022 1215   HCT 34.6 (L) 02/25/2023 1213   HCT 34.6 07/24/2022 1215   PLT 163 02/25/2023 1213   PLT 211 07/24/2022 1215   MCV 62.1 (L) 02/25/2023 1213   MCV 65 (L) 07/24/2022 1215   MCH 19.2 (L) 02/25/2023 1213   MCHC 30.9 02/25/2023 1213   RDW 15.9 (H) 02/25/2023 1213   RDW 16.3 (H) 07/24/2022 1215   LYMPHSABS 1.4  02/25/2023 1213   LYMPHSABS 1.4 08/03/2020 1100   MONOABS 0.4 02/25/2023 1213   EOSABS 0.2 02/25/2023 1213   EOSABS 0.1 08/03/2020 1100   BASOSABS 0.1 02/25/2023 1213   BASOSABS 0.1 08/03/2020 1100   .    Latest Ref Rng & Units 10/17/2022    4:54 AM 10/16/2022    1:13 PM 10/04/2022    2:52 PM  CMP  Glucose 70 - 99 mg/dL 725  366  76   BUN 8 - 23 mg/dL 13  17  24    Creatinine 0.44 - 1.00 mg/dL 4.40  3.47  4.25   Sodium 135 - 145 mmol/L 136  139  139   Potassium 3.5 - 5.1 mmol/L 3.9  4.4  4.1   Chloride 98 - 111 mmol/L 105  104  104   CO2 22 - 32 mmol/L 23  17  20    Calcium 8.9 - 10.3 mg/dL 9.7  95.6  38.7   Total Protein 6.5 - 8.1 g/dL 6.0     Total Bilirubin 0.3 - 1.2 mg/dL 0.6     Alkaline Phos 38 - 126 U/L 77     AST 15 - 41 U/L 20     ALT 0 - 44 U/L 20      . Lab Results  Component Value Date   IRON 60 02/25/2023   TIBC 435 02/25/2023   IRONPCTSAT 14 02/25/2023   (Iron and TIBC)  Lab Results  Component Value Date   FERRITIN 12 02/25/2023     RADIOGRAPHIC STUDIES: I have personally reviewed the radiological images as listed and agreed with the findings in the report.  ASSESSMENT & PLAN:   Breanna Webster is a 77 y.o. female with:  Moderate Microcytic Anemia. H/o Beta thalassemia minor with baseline hgb likely in the 10-11 range 3.   Iron deficiency   PLAN: -Discussed lab results from today, 02/25/2023, in detail with the patient. CBC shows low hemoglobin level of 10.7 g/dL with hematocrit of 56.4%, stable overall. CMP stable - Iron labs  show ferritin of 12 and Iron saturation of 14% -will give patient option for IV Iron -Continue to take Vitamin B-Complex daily. -Recommend pt continue f/u with Neurology for tremor management. -Answered all of patient's and her husband's questions.  -Will see back in 12 months with labs.  -Recommend influenza vaccine, COVID-19 Booster, RSV vaccine, and other age related vaccines.   FOLLOW-UP: [facility]erric [facility] with Dr Candise Che with labs in 12 months  The total time spent in the appointment was 21 minutes* .  All of the patient's questions  were answered with apparent satisfaction. The patient knows to call the clinic with any problems, questions or concerns.   Wyvonnia Lora MD MS AAHIVMS [facility] Hematology/Oncology Physician [facility]  .*Total Encounter Time as defined by the Centers for Medicare and Medicaid Services includes, in addition to the face-to-face time of a patient visit (documented in the note above) non-face-to-face time: obtaining and reviewing outside history, ordering and reviewing medications, tests or procedures, care coordination (communications with other health care professionals or caregivers) and documentation in the medical record.   I,Param Shah,acting as a Neurosurgeon for Wyvonnia Lora, MD.,have documented all relevant documentation on the behalf of Wyvonnia Lora, MD,as directed by  Wyvonnia Lora, MD while in the presence of Wyvonnia Lora, MD.  .I have reviewed the above documentation for accuracy and completeness, and I agree with the above. Johney Maine MD

## 2023-02-27 ENCOUNTER — Encounter: Payer: Self-pay | Admitting: Rheumatology

## 2023-02-27 ENCOUNTER — Telehealth: Payer: Self-pay | Admitting: Hematology

## 2023-02-27 NOTE — Telephone Encounter (Signed)
Left patient a message regarding next scheduled appointment times/dates

## 2023-02-28 ENCOUNTER — Encounter: Payer: Self-pay | Admitting: Hematology

## 2023-02-28 ENCOUNTER — Other Ambulatory Visit (HOSPITAL_BASED_OUTPATIENT_CLINIC_OR_DEPARTMENT_OTHER): Payer: Self-pay

## 2023-02-28 ENCOUNTER — Encounter: Payer: Self-pay | Admitting: Adult Health

## 2023-02-28 DIAGNOSIS — M25512 Pain in left shoulder: Secondary | ICD-10-CM | POA: Diagnosis not present

## 2023-02-28 DIAGNOSIS — M2560 Stiffness of unspecified joint, not elsewhere classified: Secondary | ICD-10-CM | POA: Diagnosis not present

## 2023-02-28 DIAGNOSIS — M109 Gout, unspecified: Secondary | ICD-10-CM | POA: Diagnosis not present

## 2023-02-28 DIAGNOSIS — E663 Overweight: Secondary | ICD-10-CM | POA: Diagnosis not present

## 2023-02-28 DIAGNOSIS — Z6829 Body mass index (BMI) 29.0-29.9, adult: Secondary | ICD-10-CM | POA: Diagnosis not present

## 2023-02-28 DIAGNOSIS — M79641 Pain in right hand: Secondary | ICD-10-CM | POA: Diagnosis not present

## 2023-02-28 DIAGNOSIS — R5383 Other fatigue: Secondary | ICD-10-CM | POA: Diagnosis not present

## 2023-02-28 DIAGNOSIS — Z79899 Other long term (current) drug therapy: Secondary | ICD-10-CM | POA: Diagnosis not present

## 2023-02-28 DIAGNOSIS — M2540 Effusion, unspecified joint: Secondary | ICD-10-CM | POA: Diagnosis not present

## 2023-02-28 DIAGNOSIS — M79642 Pain in left hand: Secondary | ICD-10-CM | POA: Diagnosis not present

## 2023-02-28 MED ORDER — COLCHICINE 0.6 MG PO TABS
0.6000 mg | ORAL_TABLET | Freq: Every day | ORAL | 0 refills | Status: DC
  Filled 2023-02-28: qty 90, 90d supply, fill #0

## 2023-02-28 MED ORDER — ALLOPURINOL 100 MG PO TABS
100.0000 mg | ORAL_TABLET | Freq: Every day | ORAL | 1 refills | Status: DC
  Filled 2023-02-28: qty 90, 90d supply, fill #0

## 2023-02-28 NOTE — Telephone Encounter (Signed)
Please advise 

## 2023-03-01 ENCOUNTER — Other Ambulatory Visit: Payer: Self-pay | Admitting: Adult Health

## 2023-03-01 ENCOUNTER — Other Ambulatory Visit (HOSPITAL_BASED_OUTPATIENT_CLINIC_OR_DEPARTMENT_OTHER): Payer: Self-pay

## 2023-03-01 ENCOUNTER — Encounter: Payer: Self-pay | Admitting: Hematology

## 2023-03-01 MED ORDER — ALPRAZOLAM 0.5 MG PO TABS
ORAL_TABLET | ORAL | 0 refills | Status: DC
  Filled 2023-03-01: qty 2, 1d supply, fill #0

## 2023-03-02 ENCOUNTER — Encounter: Payer: Self-pay | Admitting: Hematology

## 2023-03-02 ENCOUNTER — Other Ambulatory Visit (HOSPITAL_BASED_OUTPATIENT_CLINIC_OR_DEPARTMENT_OTHER): Payer: Self-pay

## 2023-03-03 ENCOUNTER — Encounter: Payer: Self-pay | Admitting: Hematology

## 2023-03-04 ENCOUNTER — Telehealth: Payer: Self-pay

## 2023-03-04 ENCOUNTER — Encounter: Payer: Self-pay | Admitting: Hematology

## 2023-03-04 ENCOUNTER — Other Ambulatory Visit (HOSPITAL_BASED_OUTPATIENT_CLINIC_OR_DEPARTMENT_OTHER): Payer: Self-pay

## 2023-03-04 NOTE — Telephone Encounter (Signed)
Hello, Patient will be scheduled as soon as possible.  Auth Submission: NO AUTH NEEDED Site of care: Site of care: [facility] Payer: medicare a/b, [employer]  Medication & CPT/J Code(s) submitted: Monoferric (Ferrci derisomaltose) [phone] Route of submission (phone, fax, portal): phone Phone # Fax # Auth type: Buy/Bill HB Units/visits requested: 1000mg , 1 dose Reference number: [id number] Approval from: 03/04/23 to 04/23/23

## 2023-03-05 ENCOUNTER — Encounter: Payer: Self-pay | Admitting: Hematology

## 2023-03-05 ENCOUNTER — Other Ambulatory Visit (HOSPITAL_BASED_OUTPATIENT_CLINIC_OR_DEPARTMENT_OTHER): Payer: Self-pay

## 2023-03-06 ENCOUNTER — Other Ambulatory Visit: Payer: Self-pay | Admitting: Gastroenterology

## 2023-03-06 ENCOUNTER — Other Ambulatory Visit (HOSPITAL_BASED_OUTPATIENT_CLINIC_OR_DEPARTMENT_OTHER): Payer: Self-pay

## 2023-03-06 ENCOUNTER — Encounter: Payer: Self-pay | Admitting: Adult Health

## 2023-03-06 MED ORDER — OMEPRAZOLE 20 MG PO CPDR
20.0000 mg | DELAYED_RELEASE_CAPSULE | Freq: Every day | ORAL | 1 refills | Status: DC
  Filled 2023-03-11: qty 90, 90d supply, fill #0

## 2023-03-07 ENCOUNTER — Emergency Department (HOSPITAL_COMMUNITY): Payer: Medicare Other

## 2023-03-07 ENCOUNTER — Other Ambulatory Visit: Payer: Self-pay

## 2023-03-07 ENCOUNTER — Encounter (HOSPITAL_COMMUNITY): Payer: Self-pay

## 2023-03-07 ENCOUNTER — Emergency Department (HOSPITAL_COMMUNITY)
Admission: EM | Admit: 2023-03-07 | Discharge: 2023-03-07 | Disposition: A | Discharge status: Home / Self Care | Payer: Medicare Other

## 2023-03-07 DIAGNOSIS — I4891 Unspecified atrial fibrillation: Secondary | ICD-10-CM | POA: Insufficient documentation

## 2023-03-07 DIAGNOSIS — Z7901 Long term (current) use of anticoagulants: Secondary | ICD-10-CM | POA: Insufficient documentation

## 2023-03-07 DIAGNOSIS — I10 Essential (primary) hypertension: Secondary | ICD-10-CM | POA: Insufficient documentation

## 2023-03-07 DIAGNOSIS — E119 Type 2 diabetes mellitus without complications: Secondary | ICD-10-CM | POA: Insufficient documentation

## 2023-03-07 DIAGNOSIS — Z794 Long term (current) use of insulin: Secondary | ICD-10-CM | POA: Diagnosis not present

## 2023-03-07 DIAGNOSIS — R519 Headache, unspecified: Secondary | ICD-10-CM | POA: Insufficient documentation

## 2023-03-07 DIAGNOSIS — R609 Edema, unspecified: Secondary | ICD-10-CM | POA: Diagnosis not present

## 2023-03-07 DIAGNOSIS — G4489 Other headache syndrome: Secondary | ICD-10-CM | POA: Diagnosis not present

## 2023-03-07 DIAGNOSIS — I639 Cerebral infarction, unspecified: Secondary | ICD-10-CM | POA: Diagnosis not present

## 2023-03-07 LAB — BASIC METABOLIC PANEL
Anion gap: 13 (ref 5–15)
BUN: 21 mg/dL (ref 8–23)
CO2: 18 mmol/L — ABNORMAL LOW (ref 22–32)
Calcium: 10.1 mg/dL (ref 8.9–10.3)
Chloride: 105 mmol/L (ref 98–111)
Creatinine, Ser: 0.86 mg/dL (ref 0.44–1.00)
GFR, Estimated: >60 mL/min (ref >60)
Glucose, Bld: 162 mg/dL — ABNORMAL HIGH (ref 70–99)
Potassium: 4.4 mmol/L (ref 3.5–5.1)
Sodium: 136 mmol/L (ref 135–145)

## 2023-03-07 LAB — CBC
HCT: 34.8 % — ABNORMAL LOW (ref 36.0–46.0)
Hemoglobin: 10.7 g/dL — ABNORMAL LOW (ref 12.0–15.0)
MCH: 19.1 pg — ABNORMAL LOW (ref 26.0–34.0)
MCHC: 30.7 g/dL (ref 30.0–36.0)
MCV: 62.3 fL — ABNORMAL LOW (ref 80.0–100.0)
Platelets: 156 10*3/uL (ref 150–400)
RBC: 5.59 MIL/uL — ABNORMAL HIGH (ref 3.87–5.11)
RDW: 15.9 % — ABNORMAL HIGH (ref 11.5–15.5)
WBC: 5.6 10*3/uL (ref 4.0–10.5)
nRBC: 0 % (ref 0.0–0.2)

## 2023-03-07 LAB — C-REACTIVE PROTEIN: CRP: <0.5 mg/dL (ref <1.0)

## 2023-03-07 LAB — SEDIMENTATION RATE: Sed Rate: 13 mm/h (ref 0–22)

## 2023-03-07 MED ORDER — PROCHLORPERAZINE EDISYLATE 10 MG/2ML IJ SOLN
5.0000 mg | Freq: Once | INTRAMUSCULAR | Status: AC
  Administered 2023-03-07: 5 mg via INTRAVENOUS
  Filled 2023-03-07: qty 2

## 2023-03-07 MED ORDER — DEXAMETHASONE SODIUM PHOSPHATE 10 MG/ML IJ SOLN
10.0000 mg | Freq: Once | INTRAMUSCULAR | Status: AC
  Administered 2023-03-07: 10 mg via INTRAVENOUS
  Filled 2023-03-07: qty 1

## 2023-03-07 MED ORDER — OXYCODONE-ACETAMINOPHEN 5-325 MG PO TABS
1.0000 | ORAL_TABLET | Freq: Once | ORAL | Status: AC
  Administered 2023-03-07: 1 via ORAL
  Filled 2023-03-07: qty 1

## 2023-03-07 MED ORDER — METHOCARBAMOL 500 MG PO TABS
500.0000 mg | ORAL_TABLET | Freq: Once | ORAL | Status: AC
  Administered 2023-03-07: 500 mg via ORAL
  Filled 2023-03-07: qty 1

## 2023-03-07 MED ORDER — DIPHENHYDRAMINE HCL 50 MG/ML IJ SOLN
12.5000 mg | Freq: Once | INTRAMUSCULAR | Status: AC
  Administered 2023-03-07: 12.5 mg via INTRAVENOUS
  Filled 2023-03-07: qty 1

## 2023-03-07 MED ORDER — KETOROLAC TROMETHAMINE 15 MG/ML IJ SOLN
15.0000 mg | Freq: Once | INTRAMUSCULAR | Status: AC
  Administered 2023-03-07: 15 mg via INTRAVENOUS
  Filled 2023-03-07: qty 1

## 2023-03-07 NOTE — Discharge Instructions (Addendum)
Please take Tylenol or your home amitriptyline at the first sign of recurrent headache.  Please have your MRI done as scheduled next week.  Please return to the ER for fevers or worsening symptoms.

## 2023-03-07 NOTE — ED Provider Notes (Signed)
[place] EMERGENCY DEPARTMENT AT [facility] Provider Note   CSN: [id number] Arrival date & time: 03/07/23  7829     History  Chief Complaint  Patient presents with   Headache    BARCLAY GENDREAU is a 77 y.o. female.  77 year old female with past medical history of diabetes, hypertension, and atrial fibrillation on Eliquis presenting to the emergency department today with headache.  The patient states that she has been having a global headache that has been going on now for the past 2 weeks.  The patient states that her headache started gradually and is gradually worsened.  She states that this has gotten to the point that she has had difficulty sleeping due to the headache.  She reports he normally does not get headaches.  She states that the headache is worse if she lies flat.  She denies any fevers with this.  Denies any head trauma.  She came to the ER today due to ongoing symptoms.  She denies any new focal neurological symptoms.  She denies any vision changes.   Headache      Home Medications Prior to Admission medications   Medication Sig Start Date End Date Taking? Authorizing Provider  alendronate (FOSAMAX) 70 MG tablet Take 1 tablet (70 mg total) by mouth once a week. 01/30/23     allopurinol (ZYLOPRIM) 100 MG tablet Take 1 tablet (100 mg total) by mouth daily. 02/28/23     ALPRAZolam (XANAX) 0.5 MG tablet Take one tablet 30-60 minutes before MRI. Can take a second tablet if needed right before MRI 03/01/23   Nafziger, Kandee Keen, NP  amitriptyline (ELAVIL) 25 MG tablet Take 1 tablet (25 mg total) by mouth at bedtime. 01/17/23   Nafziger, Kandee Keen, NP  amoxicillin (AMOXIL) 500 MG capsule Take 2 capsules by mouth 1 hour before dentist or other procedures and 2 capsules by mouth after visit Patient not taking: Reported on 02/06/2023 10/10/22     apixaban (ELIQUIS) 5 MG TABS tablet Take 1 tablet (5 mg total) by mouth 2 (two) times daily. 02/21/23   Shirline Frees, NP  b  complex vitamins capsule Take 1 capsule by mouth daily.    [provider]  BD PEN NEEDLE NANO 2ND GEN 32G X 4 MM MISC  11/26/20   [provider]  cholecalciferol (VITAMIN D3) 25 MCG (1000 UNIT) tablet Take 1,000 Units by mouth daily. Patient not taking: Reported on 02/06/2023    [provider]  colchicine 0.6 MG tablet Take one tablet by mouth daily until gout flare is resolved 02/01/23   Shirline Frees, NP  colchicine 0.6 MG tablet Take 1 tablet (0.6 mg total) by mouth daily. as needed for gout 02/28/23     diltiazem (CARTIA XT) 120 MG 24 hr capsule Take 1 capsule (120 mg total) by mouth daily. 02/03/23   Ronney Asters, NP  HYDROcodone-acetaminophen (NORCO/VICODIN) 5-325 MG tablet Take 1 tablet by mouth every 4 (four) hours as needed for moderate pain or severe pain. Patient not taking: Reported on 02/06/2023 10/18/22 10/18/23  Rolly Salter, MD  insulin NPH Human (HUMULIN N) 100 UNIT/ML injection Inject 20 units under the skin in the morning and 14 units in evening Patient not taking: Reported on 02/06/2023 02/20/22     Insulin NPH, Human,, Isophane, (HUMULIN N KWIKPEN) 100 UNIT/ML Kiwkpen Inject 20 units into the skin every morning and 14 units every evening. 11/01/22     Insulin NPH, Human,, Isophane, (HUMULIN N KWIKPEN) 100 UNIT/ML  Kiwkpen Inject 20 units into the skin every morning and 14 units every evening. daily 30 days 01/07/23     Insulin Pen Needle (UNIFINE PENTIPS) 32G X 4 MM MISC Inject 4 times subcutaneously 06/23/21     insulin regular (HUMULIN R) 100 units/mL injection Inject 0.1 mLs (10 Units total) into the skin 3 (three) times daily before meals Patient not taking: Reported on 02/06/2023 01/09/23     Insulin Syringe-Needle U-100 (ULTICARE INSULIN SYRINGE) 31G X 5/16" 1 ML MISC Inject 4 times daily subcutaneously 12/04/22   Nafziger, Kandee Keen, NP  metFORMIN (GLUCOPHAGE-XR) 500 MG 24 hr tablet Take 1 tablet by mouth 2 times daily 08/29/22     Multiple Vitamin  (MULITIVITAMIN WITH MINERALS) TABS Take 1 tablet by mouth daily.    [provider]  nitroGLYCERIN (NITROSTAT) 0.4 MG SL tablet Place 1 tablet (0.4 mg total) under the tongue every 5 (five) minutes as needed for chest pain. 11/14/21   Lyn Records, MD  omeprazole (PRILOSEC) 20 MG capsule Take 1 capsule (20 mg total) by mouth daily. 03/06/23   [name], Willaim Rayas, MD  polyethylene glycol (MIRALAX) 17 g packet Take 17 g by mouth daily as needed. 12/19/22   [name], Willaim Rayas, MD  potassium chloride SA (KLOR-CON M) 20 MEQ tablet Take 1 tablet (20 mEq total) by mouth daily. Take in concurrence with torsemide. 11/07/22   Nafziger, Kandee Keen, NP  rivaroxaban (XARELTO) 20 MG TABS tablet Take 1 tablet (20 mg total) by mouth daily with supper. Patient not taking: Reported on 02/06/2023 01/10/23   Ronney Asters, NP  simvastatin (ZOCOR) 10 MG tablet Take 2 tablets (20 mg total) by mouth daily. 02/08/23   Micki Riley, MD  SYNTHROID 137 MCG tablet Take 1 tablet (137 mcg total) by mouth every morning on empty stomach. 01/24/23     tiZANidine (ZANAFLEX) 2 MG tablet Take 1 tablet (2 mg total) by mouth 2 (two) times daily as needed. 11/02/22     tiZANidine (ZANAFLEX) 4 MG tablet Take 1 tablet (4 mg total) by mouth 3 (three) times daily. 01/09/23     torsemide (DEMADEX) 20 MG tablet Take 2 tablets (40 mg total) by mouth daily. 10/08/22   Ronney Asters, NP  Vibegron (GEMTESA) 75 MG TABS Take 75 mg by mouth at bedtime.    [provider]      Allergies    Patient has no known allergies.    Review of Systems   Review of Systems  Neurological:  Positive for headaches.  All other systems reviewed and are negative.   Physical Exam Updated Vital Signs BP (!) 157/98   Pulse 91   Temp (!) 97.4 F (36.3 C) (Oral)   Resp (!) 23   Ht 5\' 5"  (1.651 m)   Wt 79.4 kg   SpO2 100%   BMI 29.12 kg/m  Physical Exam Vitals and nursing note reviewed.   Gen: NAD Eyes: PERRL, EOMI HEENT: no  oropharyngeal swelling Neck: trachea midline, no meningismus Resp: clear to auscultation bilaterally Card: RRR, no murmurs, rubs, or gallops Abd: nontender, nondistended Extremities: no calf tenderness, no edema Vascular: 2+ radial pulses bilaterally, 2+ DP pulses bilaterally Neuro: Cranial nerves intact, right arm 5 out of 5 strength, left arm 4 out of 5 strength which is chronic per patient, equal strength in bilateral lower extremities, normal sensation throughout Skin: no rashes Psyc: acting appropriately   ED Results / Procedures / Treatments   Labs (all labs ordered are  listed, but only abnormal results are displayed) Labs Reviewed  CBC - Abnormal; Notable for the following components:      Result Value   RBC 5.59 (*)    Hemoglobin 10.7 (*)    HCT 34.8 (*)    MCV 62.3 (*)    MCH 19.1 (*)    RDW 15.9 (*)    All other components within normal limits  BASIC METABOLIC PANEL - Abnormal; Notable for the following components:   CO2 18 (*)    Glucose, Bld 162 (*)    All other components within normal limits  SEDIMENTATION RATE  C-REACTIVE PROTEIN    EKG EKG Interpretation Date/Time:  Thursday March 07 2023 08:03:01 EST Ventricular Rate:  81 PR Interval:  188 QRS Duration:  164 QT Interval:  456 QTC Calculation: 530 R Axis:   -50  Text Interpretation: Sinus rhythm LAE, consider biatrial enlargement Left bundle branch block Artifact in lead(s) I III aVL Confirmed by Beckey Downing [phone]) on 03/07/2023 8:36:11 AM  Radiology CT Head Wo Contrast  Result Date: 03/07/2023 CLINICAL DATA:  Headache, new onset (Age >= 51y) EXAM: CT HEAD WITHOUT CONTRAST TECHNIQUE: Contiguous axial images were obtained from the base of the skull through the vertex without intravenous contrast. RADIATION DOSE REDUCTION: This exam was performed according to the departmental dose-optimization program which includes automated exposure control, adjustment of the mA and/or kV according to patient size  and/or use of iterative reconstruction technique. COMPARISON:  CT head October 16, 2022. FINDINGS: Brain: No evidence of acute infarction, hemorrhage, hydrocephalus, extra-axial collection or mass lesion/mass effect. Remote left cerebellar infarct. Vascular: No hyperdense vessel. Skull: No acute fracture. Sinuses/Orbits: Clear sinuses.  No acute orbital findings. Other: No mastoid effusions. IMPRESSION: No evidence of acute intracranial abnormality. Electronically Signed   By: Feliberto Harts M.D.   On: 03/07/2023 08:38    Procedures Procedures    Medications Ordered in ED Medications  dexamethasone (DECADRON) injection 10 mg (has no administration in time range)  prochlorperazine (COMPAZINE) injection 5 mg (5 mg Intravenous Given 03/07/23 0824)  diphenhydrAMINE (BENADRYL) injection 12.5 mg (12.5 mg Intravenous Given 03/07/23 0824)  methocarbamol (ROBAXIN) tablet 500 mg (500 mg Oral Given 03/07/23 0900)  oxyCODONE-acetaminophen (PERCOCET/ROXICET) 5-325 MG per tablet 1 tablet (1 tablet Oral Given 03/07/23 0900)  ketorolac (TORADOL) 15 MG/ML injection 15 mg (15 mg Intravenous Given 03/07/23 4132)    ED Course/ Medical Decision Making/ A&P                                 Medical Decision Making 77 year old female with past medical history of diabetes, hypertension, hyperlipidemia, and atrial fibrillation on Eliquis presenting to the emergency department today with headache.  I will further evaluate the patient here with basic labs as well as a CT scan of her head to evaluate for intracranial hemorrhage or mass lesion given her age.  Will also obtain ESR and CRP to evaluate for underlying temporal arteritis.  I will give patient Compazine and Benadryl here in the event this is due to migrainous headache.  Based on description of her symptoms and with this gradually worsening over the past 2 weeks suspicion for subarachnoid hemorrhage is low at this time.  The patient CT scan of her head is  negative.  On reassessment she is complaining for some chronic back pain.  She has been on Percocet in the past for this.  She is given a  dose of Percocet in addition to Robaxin.  Her creatinine is within normal limits.  She is given a single dose of Toradol.  The patient's headache has resolved on reassessment.  Her ESR and CRP are negative so suspicion for temporal arteritis is low.  The patient is given a dose of Decadron here to prevent rebound headache.  I think that she is stable for discharge.  Amount and/or Complexity of Data Reviewed Labs: ordered. Radiology: ordered.  Risk Prescription drug management.           Final Clinical Impression(s) / ED Diagnoses Final diagnoses:  Nonintractable headache, unspecified chronicity pattern, unspecified headache type    Rx / DC Orders ED Discharge Orders     None         Durwin Glaze, MD 03/07/23 1045

## 2023-03-07 NOTE — ED Notes (Signed)
Patient transported to CT 

## 2023-03-07 NOTE — Telephone Encounter (Signed)
Please advise 

## 2023-03-07 NOTE — ED Triage Notes (Signed)
Pt arrives via ems to the er for the c/o headache that starts in her right eye and wraps to the back of her head. Pt also c/o left arm pain, back pain, neck pain, hx arthritis, hx 2 prev strokes. Pt states she has trouble lifting her legs when she lays down and fell and broke her clavicle recently as well as she has been taken off of her pain meds a few months ago. Vss per ems. Lengthy medical hx in chart.

## 2023-03-08 DIAGNOSIS — R350 Frequency of micturition: Secondary | ICD-10-CM | POA: Diagnosis not present

## 2023-03-08 DIAGNOSIS — R3915 Urgency of urination: Secondary | ICD-10-CM | POA: Diagnosis not present

## 2023-03-08 NOTE — Progress Notes (Signed)
Contacted pt per Dr Candise Che: to let pt know her Ferritin levels low-- would recommend 1 doses of Monoferric for her Iron deficency-- orders in can proceed if patient agreeable.   Pt acknowledged information and is agreeable. Pt will contact [facility] to schedule.

## 2023-03-11 ENCOUNTER — Other Ambulatory Visit (HOSPITAL_BASED_OUTPATIENT_CLINIC_OR_DEPARTMENT_OTHER): Payer: Self-pay

## 2023-03-11 ENCOUNTER — Encounter: Payer: Self-pay | Admitting: Hematology

## 2023-03-11 DIAGNOSIS — M810 Age-related osteoporosis without current pathological fracture: Secondary | ICD-10-CM | POA: Diagnosis not present

## 2023-03-12 ENCOUNTER — Encounter: Payer: Self-pay | Admitting: Neurology

## 2023-03-12 ENCOUNTER — Encounter: Payer: Self-pay | Admitting: Hematology

## 2023-03-18 ENCOUNTER — Other Ambulatory Visit (HOSPITAL_BASED_OUTPATIENT_CLINIC_OR_DEPARTMENT_OTHER): Payer: Self-pay

## 2023-03-18 DIAGNOSIS — H2513 Age-related nuclear cataract, bilateral: Secondary | ICD-10-CM | POA: Diagnosis not present

## 2023-03-18 DIAGNOSIS — H5021 Vertical strabismus, right eye: Secondary | ICD-10-CM | POA: Diagnosis not present

## 2023-03-18 DIAGNOSIS — E039 Hypothyroidism, unspecified: Secondary | ICD-10-CM | POA: Diagnosis not present

## 2023-03-18 DIAGNOSIS — E119 Type 2 diabetes mellitus without complications: Secondary | ICD-10-CM | POA: Diagnosis not present

## 2023-03-18 DIAGNOSIS — E1165 Type 2 diabetes mellitus with hyperglycemia: Secondary | ICD-10-CM | POA: Diagnosis not present

## 2023-03-18 DIAGNOSIS — H34212 Partial retinal artery occlusion, left eye: Secondary | ICD-10-CM | POA: Diagnosis not present

## 2023-03-18 DIAGNOSIS — H04123 Dry eye syndrome of bilateral lacrimal glands: Secondary | ICD-10-CM | POA: Diagnosis not present

## 2023-03-18 DIAGNOSIS — H532 Diplopia: Secondary | ICD-10-CM | POA: Diagnosis not present

## 2023-03-19 ENCOUNTER — Telehealth: Payer: Self-pay | Admitting: Neurology

## 2023-03-19 ENCOUNTER — Ambulatory Visit
Admission: RE | Admit: 2023-03-19 | Discharge: 2023-03-19 | Disposition: A | Discharge status: Home / Self Care | Payer: Medicare Other | Source: Ambulatory Visit | Attending: Neurology | Admitting: Neurology

## 2023-03-19 ENCOUNTER — Encounter: Payer: Self-pay | Admitting: Hematology

## 2023-03-19 MED ORDER — GADOPICLENOL 0.5 MMOL/ML IV SOLN: 8.0000 mL | Freq: Once | INTRAVENOUS | Status: DC | PRN

## 2023-03-19 NOTE — Telephone Encounter (Signed)
Pt called to report she went for MRI that was supposed to have been an open MRI, pt said they tried doing a closed MRI and she panicked.  Phone rep reached out to MRI coordinator who stated she will send order to a different place to schedule an open one.  This was relayed to pt.  FYI for POD 3 no call back requested by pt for POD 3

## 2023-03-24 ENCOUNTER — Other Ambulatory Visit: Payer: Self-pay

## 2023-03-24 ENCOUNTER — Encounter (HOSPITAL_COMMUNITY): Payer: Self-pay

## 2023-03-24 ENCOUNTER — Emergency Department (HOSPITAL_COMMUNITY): Payer: Medicare Other

## 2023-03-24 ENCOUNTER — Observation Stay (HOSPITAL_COMMUNITY)
Admission: EM | Admit: 2023-03-24 | Discharge: 2023-03-25 | Disposition: A | Discharge status: Home / Self Care | Payer: Medicare Other | Attending: Obstetrics and Gynecology | Admitting: Obstetrics and Gynecology

## 2023-03-24 DIAGNOSIS — R110 Nausea: Secondary | ICD-10-CM | POA: Diagnosis not present

## 2023-03-24 DIAGNOSIS — R918 Other nonspecific abnormal finding of lung field: Secondary | ICD-10-CM | POA: Diagnosis not present

## 2023-03-24 DIAGNOSIS — F064 Anxiety disorder due to known physiological condition: Secondary | ICD-10-CM | POA: Diagnosis present

## 2023-03-24 DIAGNOSIS — I110 Hypertensive heart disease with heart failure: Secondary | ICD-10-CM | POA: Insufficient documentation

## 2023-03-24 DIAGNOSIS — N281 Cyst of kidney, acquired: Secondary | ICD-10-CM | POA: Diagnosis not present

## 2023-03-24 DIAGNOSIS — I2510 Atherosclerotic heart disease of native coronary artery without angina pectoris: Secondary | ICD-10-CM | POA: Insufficient documentation

## 2023-03-24 DIAGNOSIS — E1169 Type 2 diabetes mellitus with other specified complication: Secondary | ICD-10-CM

## 2023-03-24 DIAGNOSIS — Z955 Presence of coronary angioplasty implant and graft: Secondary | ICD-10-CM | POA: Insufficient documentation

## 2023-03-24 DIAGNOSIS — F419 Anxiety disorder, unspecified: Secondary | ICD-10-CM | POA: Diagnosis not present

## 2023-03-24 DIAGNOSIS — Z7901 Long term (current) use of anticoagulants: Secondary | ICD-10-CM | POA: Insufficient documentation

## 2023-03-24 DIAGNOSIS — Z794 Long term (current) use of insulin: Secondary | ICD-10-CM | POA: Diagnosis not present

## 2023-03-24 DIAGNOSIS — M545 Low back pain, unspecified: Principal | ICD-10-CM | POA: Insufficient documentation

## 2023-03-24 DIAGNOSIS — G4733 Obstructive sleep apnea (adult) (pediatric): Secondary | ICD-10-CM | POA: Insufficient documentation

## 2023-03-24 DIAGNOSIS — G20C Parkinsonism, unspecified: Secondary | ICD-10-CM | POA: Diagnosis not present

## 2023-03-24 DIAGNOSIS — R079 Chest pain, unspecified: Secondary | ICD-10-CM | POA: Diagnosis present

## 2023-03-24 DIAGNOSIS — Z8673 Personal history of transient ischemic attack (TIA), and cerebral infarction without residual deficits: Secondary | ICD-10-CM | POA: Insufficient documentation

## 2023-03-24 DIAGNOSIS — K5730 Diverticulosis of large intestine without perforation or abscess without bleeding: Secondary | ICD-10-CM | POA: Diagnosis not present

## 2023-03-24 DIAGNOSIS — E039 Hypothyroidism, unspecified: Secondary | ICD-10-CM | POA: Insufficient documentation

## 2023-03-24 DIAGNOSIS — Z79899 Other long term (current) drug therapy: Secondary | ICD-10-CM | POA: Diagnosis not present

## 2023-03-24 DIAGNOSIS — I5030 Unspecified diastolic (congestive) heart failure: Secondary | ICD-10-CM | POA: Insufficient documentation

## 2023-03-24 DIAGNOSIS — R42 Dizziness and giddiness: Secondary | ICD-10-CM | POA: Diagnosis not present

## 2023-03-24 DIAGNOSIS — R9431 Abnormal electrocardiogram [ECG] [EKG]: Secondary | ICD-10-CM | POA: Diagnosis not present

## 2023-03-24 DIAGNOSIS — S0990XA Unspecified injury of head, initial encounter: Secondary | ICD-10-CM | POA: Diagnosis not present

## 2023-03-24 DIAGNOSIS — T1490XA Injury, unspecified, initial encounter: Secondary | ICD-10-CM

## 2023-03-24 DIAGNOSIS — G4489 Other headache syndrome: Secondary | ICD-10-CM | POA: Diagnosis not present

## 2023-03-24 DIAGNOSIS — G8194 Hemiplegia, unspecified affecting left nondominant side: Secondary | ICD-10-CM | POA: Insufficient documentation

## 2023-03-24 DIAGNOSIS — Z96612 Presence of left artificial shoulder joint: Secondary | ICD-10-CM | POA: Diagnosis not present

## 2023-03-24 DIAGNOSIS — R7989 Other specified abnormal findings of blood chemistry: Secondary | ICD-10-CM

## 2023-03-24 DIAGNOSIS — E785 Hyperlipidemia, unspecified: Secondary | ICD-10-CM | POA: Diagnosis not present

## 2023-03-24 DIAGNOSIS — Z952 Presence of prosthetic heart valve: Secondary | ICD-10-CM | POA: Diagnosis not present

## 2023-03-24 DIAGNOSIS — I10 Essential (primary) hypertension: Secondary | ICD-10-CM | POA: Diagnosis not present

## 2023-03-24 DIAGNOSIS — R519 Headache, unspecified: Secondary | ICD-10-CM | POA: Diagnosis not present

## 2023-03-24 DIAGNOSIS — I6782 Cerebral ischemia: Secondary | ICD-10-CM | POA: Diagnosis not present

## 2023-03-24 DIAGNOSIS — R778 Other specified abnormalities of plasma proteins: Secondary | ICD-10-CM | POA: Diagnosis not present

## 2023-03-24 DIAGNOSIS — I480 Paroxysmal atrial fibrillation: Secondary | ICD-10-CM | POA: Diagnosis not present

## 2023-03-24 LAB — CBC
HCT: 33.7 % — ABNORMAL LOW (ref 36.0–46.0)
Hemoglobin: 10.5 g/dL — ABNORMAL LOW (ref 12.0–15.0)
MCH: 18.9 pg — ABNORMAL LOW (ref 26.0–34.0)
MCHC: 31.2 g/dL (ref 30.0–36.0)
MCV: 60.6 fL — ABNORMAL LOW (ref 80.0–100.0)
Platelets: 197 10*3/uL (ref 150–400)
RBC: 5.56 MIL/uL — ABNORMAL HIGH (ref 3.87–5.11)
RDW: 16.2 % — ABNORMAL HIGH (ref 11.5–15.5)
WBC: 6.4 10*3/uL (ref 4.0–10.5)
nRBC: 0 % (ref 0.0–0.2)

## 2023-03-24 LAB — BASIC METABOLIC PANEL
Anion gap: 11 (ref 5–15)
BUN: 15 mg/dL (ref 8–23)
CO2: 21 mmol/L — ABNORMAL LOW (ref 22–32)
Calcium: 10.3 mg/dL (ref 8.9–10.3)
Chloride: 107 mmol/L (ref 98–111)
Creatinine, Ser: 0.89 mg/dL (ref 0.44–1.00)
GFR, Estimated: >60 mL/min (ref >60)
Glucose, Bld: 133 mg/dL — ABNORMAL HIGH (ref 70–99)
Potassium: 3.9 mmol/L (ref 3.5–5.1)
Sodium: 139 mmol/L (ref 135–145)

## 2023-03-24 LAB — TROPONIN I (HIGH SENSITIVITY): Troponin I (High Sensitivity): 28 ng/L — ABNORMAL HIGH (ref <18)

## 2023-03-24 MED ORDER — FENTANYL CITRATE PF 50 MCG/ML IJ SOSY
25.0000 ug | PREFILLED_SYRINGE | Freq: Once | INTRAMUSCULAR | Status: AC
  Administered 2023-03-24: 25 ug via INTRAVENOUS
  Filled 2023-03-24: qty 1

## 2023-03-24 MED ORDER — TRIMETHOBENZAMIDE HCL 100 MG/ML IM SOLN
200.0000 mg | INTRAMUSCULAR | Status: AC
  Administered 2023-03-25: 200 mg via INTRAMUSCULAR
  Filled 2023-03-24 (×2): qty 2

## 2023-03-25 ENCOUNTER — Emergency Department (HOSPITAL_COMMUNITY): Payer: Medicare Other

## 2023-03-25 ENCOUNTER — Encounter: Payer: Self-pay | Admitting: Hematology

## 2023-03-25 DIAGNOSIS — R519 Headache, unspecified: Secondary | ICD-10-CM | POA: Diagnosis not present

## 2023-03-25 DIAGNOSIS — R079 Chest pain, unspecified: Secondary | ICD-10-CM | POA: Diagnosis present

## 2023-03-25 DIAGNOSIS — G4733 Obstructive sleep apnea (adult) (pediatric): Secondary | ICD-10-CM | POA: Diagnosis not present

## 2023-03-25 DIAGNOSIS — I480 Paroxysmal atrial fibrillation: Secondary | ICD-10-CM | POA: Diagnosis not present

## 2023-03-25 DIAGNOSIS — G8929 Other chronic pain: Secondary | ICD-10-CM

## 2023-03-25 DIAGNOSIS — F419 Anxiety disorder, unspecified: Secondary | ICD-10-CM | POA: Diagnosis not present

## 2023-03-25 DIAGNOSIS — R42 Dizziness and giddiness: Secondary | ICD-10-CM | POA: Diagnosis not present

## 2023-03-25 DIAGNOSIS — E1169 Type 2 diabetes mellitus with other specified complication: Secondary | ICD-10-CM | POA: Diagnosis not present

## 2023-03-25 DIAGNOSIS — M545 Low back pain, unspecified: Secondary | ICD-10-CM | POA: Diagnosis not present

## 2023-03-25 DIAGNOSIS — R9431 Abnormal electrocardiogram [ECG] [EKG]: Secondary | ICD-10-CM | POA: Diagnosis not present

## 2023-03-25 DIAGNOSIS — S0990XA Unspecified injury of head, initial encounter: Secondary | ICD-10-CM | POA: Diagnosis not present

## 2023-03-25 DIAGNOSIS — I5032 Chronic diastolic (congestive) heart failure: Secondary | ICD-10-CM

## 2023-03-25 DIAGNOSIS — K5730 Diverticulosis of large intestine without perforation or abscess without bleeding: Secondary | ICD-10-CM | POA: Diagnosis not present

## 2023-03-25 DIAGNOSIS — Z794 Long term (current) use of insulin: Secondary | ICD-10-CM | POA: Diagnosis not present

## 2023-03-25 DIAGNOSIS — I2510 Atherosclerotic heart disease of native coronary artery without angina pectoris: Secondary | ICD-10-CM | POA: Diagnosis not present

## 2023-03-25 DIAGNOSIS — R918 Other nonspecific abnormal finding of lung field: Secondary | ICD-10-CM | POA: Diagnosis not present

## 2023-03-25 DIAGNOSIS — G20C Parkinsonism, unspecified: Secondary | ICD-10-CM | POA: Diagnosis not present

## 2023-03-25 DIAGNOSIS — I6782 Cerebral ischemia: Secondary | ICD-10-CM | POA: Diagnosis not present

## 2023-03-25 DIAGNOSIS — N281 Cyst of kidney, acquired: Secondary | ICD-10-CM | POA: Diagnosis not present

## 2023-03-25 LAB — URINALYSIS, ROUTINE W REFLEX MICROSCOPIC
Bacteria, UA: NONE SEEN
Bilirubin Urine: NEGATIVE
Glucose, UA: NEGATIVE mg/dL
Hgb urine dipstick: NEGATIVE
Ketones, ur: NEGATIVE mg/dL
Leukocytes,Ua: NEGATIVE
Nitrite: NEGATIVE
Protein, ur: NEGATIVE mg/dL
Specific Gravity, Urine: 1.004 — ABNORMAL LOW (ref 1.005–1.030)
pH: 7 (ref 5.0–8.0)

## 2023-03-25 LAB — TROPONIN I (HIGH SENSITIVITY): Troponin I (High Sensitivity): 26 ng/L — ABNORMAL HIGH (ref <18)

## 2023-03-25 LAB — CBC
HCT: 31.6 % — ABNORMAL LOW (ref 36.0–46.0)
Hemoglobin: 9.6 g/dL — ABNORMAL LOW (ref 12.0–15.0)
MCH: 18.6 pg — ABNORMAL LOW (ref 26.0–34.0)
MCHC: 30.4 g/dL (ref 30.0–36.0)
MCV: 61.2 fL — ABNORMAL LOW (ref 80.0–100.0)
Platelets: 180 10*3/uL (ref 150–400)
RBC: 5.16 MIL/uL — ABNORMAL HIGH (ref 3.87–5.11)
RDW: 16.3 % — ABNORMAL HIGH (ref 11.5–15.5)
WBC: 6.2 10*3/uL (ref 4.0–10.5)
nRBC: 0 % (ref 0.0–0.2)

## 2023-03-25 LAB — CREATININE, SERUM
Creatinine, Ser: 0.94 mg/dL (ref 0.44–1.00)
GFR, Estimated: >60 mL/min (ref >60)

## 2023-03-25 LAB — CBG MONITORING, ED: Glucose-Capillary: 146 mg/dL — ABNORMAL HIGH (ref 70–99)

## 2023-03-25 LAB — MAGNESIUM: Magnesium: 1.7 mg/dL (ref 1.7–2.4)

## 2023-03-25 MED ORDER — ACETAMINOPHEN 650 MG RE SUPP: 650.0000 mg | Freq: Four times a day (QID) | RECTAL | Status: DC | PRN

## 2023-03-25 MED ORDER — ACETAMINOPHEN 325 MG PO TABS: 650.0000 mg | ORAL_TABLET | Freq: Four times a day (QID) | ORAL | Status: DC | PRN

## 2023-03-25 MED ORDER — OXYCODONE HCL 5 MG PO TABS: 5.0000 mg | ORAL_TABLET | ORAL | Status: DC | PRN

## 2023-03-25 MED ORDER — METHOCARBAMOL 1000 MG/10ML IJ SOLN
1000.0000 mg | Freq: Once | INTRAMUSCULAR | Status: AC
  Administered 2023-03-25: 1000 mg via INTRAVENOUS
  Filled 2023-03-25: qty 10

## 2023-03-25 MED ORDER — ACETAMINOPHEN 500 MG PO TABS
1000.0000 mg | ORAL_TABLET | Freq: Four times a day (QID) | ORAL | Status: DC
  Administered 2023-03-25: 1000 mg via ORAL
  Filled 2023-03-25: qty 2

## 2023-03-25 MED ORDER — HYDROMORPHONE HCL 1 MG/ML IJ SOLN
1.0000 mg | INTRAMUSCULAR | Status: DC | PRN
  Administered 2023-03-25: 1 mg via INTRAVENOUS
  Filled 2023-03-25: qty 1

## 2023-03-25 MED ORDER — PROCHLORPERAZINE EDISYLATE 10 MG/2ML IJ SOLN: 5.0000 mg | INTRAMUSCULAR | Status: DC | PRN

## 2023-03-25 MED ORDER — LEVOTHYROXINE SODIUM 25 MCG PO TABS
137.0000 ug | ORAL_TABLET | Freq: Every morning | ORAL | Status: DC
  Administered 2023-03-25: 137 ug via ORAL
  Filled 2023-03-25: qty 1

## 2023-03-25 MED ORDER — APIXABAN 5 MG PO TABS: 5.0000 mg | ORAL_TABLET | Freq: Two times a day (BID) | ORAL | Status: DC

## 2023-03-25 MED ORDER — INSULIN ASPART 100 UNIT/ML IJ SOLN: 0.0000 [IU] | Freq: Every day | INTRAMUSCULAR | Status: DC

## 2023-03-25 MED ORDER — IOHEXOL 350 MG/ML SOLN
100.0000 mL | Freq: Once | INTRAVENOUS | Status: AC | PRN
  Administered 2023-03-25: 100 mL via INTRAVENOUS

## 2023-03-25 MED ORDER — POTASSIUM CHLORIDE 10 MEQ/100ML IV SOLN
10.0000 meq | INTRAVENOUS | Status: AC
  Administered 2023-03-25 (×2): 10 meq via INTRAVENOUS
  Filled 2023-03-25 (×2): qty 100

## 2023-03-25 MED ORDER — SENNOSIDES-DOCUSATE SODIUM 8.6-50 MG PO TABS: 1.0000 | ORAL_TABLET | Freq: Every evening | ORAL | Status: DC | PRN

## 2023-03-25 MED ORDER — LORAZEPAM 2 MG/ML IJ SOLN
0.5000 mg | Freq: Once | INTRAMUSCULAR | Status: AC
  Administered 2023-03-25: 0.5 mg via INTRAVENOUS
  Filled 2023-03-25: qty 1

## 2023-03-25 MED ORDER — ENOXAPARIN SODIUM 40 MG/0.4ML IJ SOSY: 40.0000 mg | PREFILLED_SYRINGE | Freq: Every day | INTRAMUSCULAR | Status: DC

## 2023-03-25 MED ORDER — KETOROLAC TROMETHAMINE 30 MG/ML IJ SOLN
30.0000 mg | Freq: Once | INTRAMUSCULAR | Status: AC
Start: 2023-03-25 — End: 2023-03-25
  Administered 2023-03-25: 30 mg via INTRAVENOUS
  Filled 2023-03-25: qty 1

## 2023-03-25 MED ORDER — DILTIAZEM HCL ER COATED BEADS 120 MG PO CP24
120.0000 mg | ORAL_CAPSULE | Freq: Every day | ORAL | Status: DC
  Filled 2023-03-25: qty 1

## 2023-03-25 MED ORDER — TORSEMIDE 20 MG PO TABS: 40.0000 mg | ORAL_TABLET | Freq: Every day | ORAL | Status: DC

## 2023-03-25 MED ORDER — ALLOPURINOL 100 MG PO TABS: 100.0000 mg | ORAL_TABLET | Freq: Every day | ORAL | Status: DC

## 2023-03-25 MED ORDER — MAGNESIUM SULFATE 2 GM/50ML IV SOLN
2.0000 g | Freq: Once | INTRAVENOUS | Status: AC
Start: 2023-03-25 — End: 2023-03-25
  Administered 2023-03-25: 2 g via INTRAVENOUS
  Filled 2023-03-25: qty 50

## 2023-03-25 MED ORDER — INSULIN ASPART 100 UNIT/ML IJ SOLN
0.0000 [IU] | Freq: Three times a day (TID) | INTRAMUSCULAR | Status: DC
  Administered 2023-03-25: 2 [IU] via SUBCUTANEOUS

## 2023-03-25 MED ORDER — LORAZEPAM 2 MG/ML IJ SOLN: 0.5000 mg | Freq: Three times a day (TID) | INTRAMUSCULAR | Status: DC | PRN

## 2023-03-25 NOTE — ED Provider Notes (Signed)
[place] EMERGENCY DEPARTMENT AT [facility] Provider Note   CSN: [id number] Arrival date & time: 03/24/23  2238     History  Chief Complaint  Patient presents with   Dizziness    Patient to ED via EMS with complaint of dizziness, and HTN x 10 hours. Patient reports back pain x 3 weeks    Breanna Webster is a 77 y.o. female.  The history is provided by the patient and the spouse.  Dizziness Quality:  Lightheadedness Severity:  Moderate Onset quality:  Gradual Timing:  Constant Progression:  Unchanged Chronicity:  New Context: not when urinating   Relieved by:  Nothing Worsened by:  Nothing Ineffective treatments:  None tried Associated symptoms: nausea   Associated symptoms: no weakness   Associated symptoms comment:  Back and hip pain that is acute on chronic  Risk factors: no hx of vertigo   Patient with anxiety and AFIB, CAD presents with nausea lightheadedness with chronic back pain presents with exacerbation of back pain and nausea.      Past Medical History:  Diagnosis Date   Anxiety    Arthritis    "back" (04/22/2018)   Atrial fibrillation with RVR (HCC) 10/21/2018   Back pain    Bacteremia due to Streptococcus Salivarius 10/07/2018   Blood transfusion without reported diagnosis    CAD (coronary artery disease)    a. 03/2018 s/p PCI/DES to the RCA (3.0x15 Onyx DES).   Carotid artery stenosis    Mild   Cerebellar stroke, acute (HCC) 10/21/2018   Cerebrovascular accident (CVA) due to embolism of right posterior cerebral artery (HCC) 08/03/2020   Chest pain    CHF (congestive heart failure) (HCC)    Chronic lower back pain    Cirrhosis (HCC)    Colon polyps    Diverticulitis    Diverticulosis    Esophageal thickening    seen on pre TAVR CT scan, also questionable cirrhosis. MRI recommended. Will refer to GI   GERD (gastroesophageal reflux disease)    Gout    Grave's disease    H/O total shoulder replacement, left 06/01/2022   Heart  murmur    History of cardioembolic stroke 03/29/2020   History of colonic polyps 05/22/2017   History of hiatal hernia    Hyperlipidemia    Hypertension    Hypothyroidism    IBS (irritable bowel syndrome)    Osteopenia    Parkinson's syndrome (HCC) 04/23/2020   Pulmonary nodules    seen on pre TAVR CT. likley benign. no follow up recommended if pt low risk.   S/P TAVR (transcatheter aortic valve replacement)    Shortness of breath on exertion    Stroke [facility]) 2021   x2   Thalassemia minor    Thyroid disease    Type II diabetes mellitus (HCC)      Home Medications Prior to Admission medications   Medication Sig Start Date End Date Taking? Authorizing Provider  alendronate (FOSAMAX) 70 MG tablet Take 1 tablet (70 mg total) by mouth once a week. 01/30/23     allopurinol (ZYLOPRIM) 100 MG tablet Take 1 tablet (100 mg total) by mouth daily. 02/28/23     ALPRAZolam (XANAX) 0.5 MG tablet Take one tablet 30-60 minutes before MRI. Can take a second tablet if needed right before MRI Patient not taking: Reported on 03/07/2023 03/01/23   Shirline Frees, NP  amitriptyline (ELAVIL) 25 MG tablet Take 1 tablet (25 mg total) by mouth at bedtime. 01/17/23   Nafziger,  Kandee Keen, NP  apixaban (ELIQUIS) 5 MG TABS tablet Take 1 tablet (5 mg total) by mouth 2 (two) times daily. 02/21/23   Shirline Frees, NP  b complex vitamins capsule Take 1 capsule by mouth daily.    [provider]  colchicine 0.6 MG tablet Take 1 tablet (0.6 mg total) by mouth daily. as needed for gout 02/28/23     diltiazem (CARTIA XT) 120 MG 24 hr capsule Take 1 capsule (120 mg total) by mouth daily. 02/03/23   Ronney Asters, NP  Insulin NPH, Human,, Isophane, (HUMULIN N KWIKPEN) 100 UNIT/ML Kiwkpen Inject 20 units into the skin every morning and 14 units every evening. 11/01/22     insulin regular (HUMULIN R) 100 units/mL injection Inject 0.1 mLs (10 Units total) into the skin 3 (three) times daily before meals 01/09/23     Insulin  Syringe-Needle U-100 (ULTICARE INSULIN SYRINGE) 31G X 5/16" 1 ML MISC Inject 4 times daily subcutaneously 12/04/22   Nafziger, Kandee Keen, NP  metFORMIN (GLUCOPHAGE-XR) 500 MG 24 hr tablet Take 1 tablet by mouth 2 times daily 08/29/22     Multiple Vitamin (MULITIVITAMIN WITH MINERALS) TABS Take 1 tablet by mouth daily.    [provider]  nitroGLYCERIN (NITROSTAT) 0.4 MG SL tablet Place 1 tablet (0.4 mg total) under the tongue every 5 (five) minutes as needed for chest pain. 11/14/21   Lyn Records, MD  omeprazole (PRILOSEC) 20 MG capsule Take 1 capsule (20 mg total) by mouth daily. 03/06/23   [name], Willaim Rayas, MD  polyethylene glycol (MIRALAX) 17 g packet Take 17 g by mouth daily as needed. 12/19/22   [name], Willaim Rayas, MD  potassium chloride SA (KLOR-CON M) 20 MEQ tablet Take 1 tablet (20 mEq total) by mouth daily. Take in concurrence with torsemide. 11/07/22   Nafziger, Kandee Keen, NP  simvastatin (ZOCOR) 10 MG tablet Take 2 tablets (20 mg total) by mouth daily. Patient not taking: Reported on 03/07/2023 02/08/23   Micki Riley, MD  SYNTHROID 137 MCG tablet Take 1 tablet (137 mcg total) by mouth every morning on empty stomach. 01/24/23     tiZANidine (ZANAFLEX) 4 MG tablet Take 1 tablet (4 mg total) by mouth 3 (three) times daily. 01/09/23     torsemide (DEMADEX) 20 MG tablet Take 2 tablets (40 mg total) by mouth daily. 10/08/22   Ronney Asters, NP  Vibegron (GEMTESA) 75 MG TABS Take 75 mg by mouth at bedtime.    [provider]      Allergies    Patient has no known allergies.    Review of Systems   Review of Systems  Constitutional:  Negative for fever.  HENT:  Negative for facial swelling.   Gastrointestinal:  Positive for nausea.  Musculoskeletal:  Positive for back pain.  Neurological:  Positive for light-headedness. Negative for weakness and numbness.  All other systems reviewed and are negative.   Physical Exam Updated Vital Signs BP (!) 151/78   Pulse 88   Temp  (!) 97.4 F (36.3 C) (Oral)   Resp 19   Ht 5\' 5"  (1.651 m)   Wt 79 kg   SpO2 100%   BMI 28.98 kg/m  Physical Exam Vitals and nursing note reviewed.  Constitutional:      General: She is not in acute distress.    Appearance: Normal appearance. She is well-developed.  HENT:     Head: Normocephalic and atraumatic.     Nose: Nose normal.  Mouth/Throat:     Mouth: Mucous membranes are moist.     Pharynx: Oropharynx is clear.  Eyes:     Pupils: Pupils are equal, round, and reactive to light.  Cardiovascular:     Rate and Rhythm: Normal rate and regular rhythm.     Pulses: Normal pulses.     Heart sounds: Normal heart sounds.  Pulmonary:     Effort: Pulmonary effort is normal. No respiratory distress.     Breath sounds: Normal breath sounds.  Abdominal:     General: Bowel sounds are normal. There is no distension.     Palpations: Abdomen is soft.     Tenderness: There is no abdominal tenderness. There is no guarding or rebound.  Musculoskeletal:        General: Normal range of motion.     Cervical back: Neck supple.  Skin:    General: Skin is warm and dry.     Capillary Refill: Capillary refill takes less than 2 seconds.     Findings: No erythema or rash.  Neurological:     General: No focal deficit present.     Mental Status: She is alert.     Deep Tendon Reflexes: Reflexes normal.  Psychiatric:        Mood and Affect: Mood normal.     ED Results / Procedures / Treatments   Labs (all labs ordered are listed, but only abnormal results are displayed) Results for orders placed or performed during the hospital encounter of 03/24/23  Basic metabolic panel  Result Value Ref Range   Sodium 139 135 - 145 mmol/L   Potassium 3.9 3.5 - 5.1 mmol/L   Chloride 107 98 - 111 mmol/L   CO2 21 (L) 22 - 32 mmol/L   Glucose, Bld 133 (H) 70 - 99 mg/dL   BUN 15 8 - 23 mg/dL   Creatinine, Ser 2.13 0.44 - 1.00 mg/dL   Calcium 08.6 8.9 - 57.8 mg/dL   GFR, Estimated >46 >96 mL/min    Anion gap 11 5 - 15  CBC  Result Value Ref Range   WBC 6.4 4.0 - 10.5 K/uL   RBC 5.56 (H) 3.87 - 5.11 MIL/uL   Hemoglobin 10.5 (L) 12.0 - 15.0 g/dL   HCT 29.5 (L) 28.4 - 13.2 %   MCV 60.6 (L) 80.0 - 100.0 fL   MCH 18.9 (L) 26.0 - 34.0 pg   MCHC 31.2 30.0 - 36.0 g/dL   RDW 44.0 (H) 10.2 - 72.5 %   Platelets 197 150 - 400 K/uL   nRBC 0.0 0.0 - 0.2 %  Urinalysis, Routine w reflex microscopic -Urine, Clean Catch  Result Value Ref Range   Color, Urine YELLOW YELLOW   APPearance HAZY (A) CLEAR   Specific Gravity, Urine 1.004 (L) 1.005 - 1.030   pH 7.0 5.0 - 8.0   Glucose, UA NEGATIVE NEGATIVE mg/dL   Hgb urine dipstick NEGATIVE NEGATIVE   Bilirubin Urine NEGATIVE NEGATIVE   Ketones, ur NEGATIVE NEGATIVE mg/dL   Protein, ur NEGATIVE NEGATIVE mg/dL   Nitrite NEGATIVE NEGATIVE   Leukocytes,Ua NEGATIVE NEGATIVE   RBC / HPF 0-5 0 - 5 RBC/hpf   WBC, UA 0-5 0 - 5 WBC/hpf   Bacteria, UA NONE SEEN NONE SEEN   Squamous Epithelial / HPF 0-5 0 - 5 /HPF  Magnesium  Result Value Ref Range   Magnesium 1.7 1.7 - 2.4 mg/dL  Troponin I (High Sensitivity)  Result Value Ref Range   Troponin I (High  Sensitivity) 28 (H) <18 ng/L  Troponin I (High Sensitivity)  Result Value Ref Range   Troponin I (High Sensitivity) 26 (H) <18 ng/L   *Note: Due to a large number of results and/or encounters for the requested time period, some results have not been displayed. A complete set of results can be found in Results Review.   CT HEAD WO CONTRAST ( )  Result Date: 03/25/2023 CLINICAL DATA:  Poly trauma, blunt. Dizziness and hypertension for 10 hours. Pain for 3 weeks. EXAM: CT HEAD WITHOUT CONTRAST TECHNIQUE: Contiguous axial images were obtained from the base of the skull through the vertex without intravenous contrast. RADIATION DOSE REDUCTION: This exam was performed according to the departmental dose-optimization program which includes automated exposure control, adjustment of the mA and/or kV according to  patient size and/or use of iterative reconstruction technique. COMPARISON:  03/07/2023 FINDINGS: Brain: Diffuse cerebral atrophy. Ventricular dilatation consistent with central atrophy. Low-attenuation changes in the deep white matter consistent with small vessel ischemia. No abnormal extra-axial fluid collections. No mass effect or midline shift. Gray-white matter junctions are distinct. Basal cisterns are not effaced. No acute intracranial hemorrhage. Vascular: No hyperdense vessel or unexpected calcification. Skull: Normal. Negative for fracture or focal lesion. Sinuses/Orbits: Mild mucosal thickening in the paranasal sinuses. No acute air-fluid levels. Mastoid air cells are clear. Other: None. IMPRESSION: No acute intracranial abnormalities. Chronic atrophy and small vessel ischemic changes. Electronically Signed   By: Burman Nieves M.D.   On: 03/25/2023 02:07   CT Angio Chest/Abd/Pel for Dissection W and/or Wo Contrast  Result Date: 03/25/2023 CLINICAL DATA:  Acute aortic syndrome suspected. Dizziness and hypertension for 10 hours. Back pain for 3 weeks. EXAM: CT ANGIOGRAPHY CHEST, ABDOMEN AND PELVIS TECHNIQUE: Non-contrast CT of the chest was initially obtained. Multidetector CT imaging through the chest, abdomen and pelvis was performed using the standard protocol during bolus administration of intravenous contrast. Multiplanar reconstructed images and MIPs were obtained and reviewed to evaluate the vascular anatomy. RADIATION DOSE REDUCTION: This exam was performed according to the departmental dose-optimization program which includes automated exposure control, adjustment of the mA and/or kV according to patient size and/or use of iterative reconstruction technique. CONTRAST:  OMNIPAQUE IOHEXOL 350 MG/ML SOLN COMPARISON:  Chest radiograph 03/24/2023.  CT chest 10/16/2022 FINDINGS: CTA CHEST FINDINGS Cardiovascular: Unenhanced images of the chest demonstrate aortic valve and mitral valve  prostheses. Calcification throughout the aorta. Mild calcification of the coronary arteries. Postoperative changes in the left shoulder. Images obtained during arterial phase after intravenous contrast material administration demonstrate normal caliber thoracic aorta. No aortic dissection. Great vessel origins are patent. Central pulmonary arteries are patent without evidence of significant pulmonary embolus. Mild cardiac enlargement.  No pericardial effusions. Mediastinum/Nodes: No enlarged mediastinal, hilar, or axillary lymph nodes. Thyroid gland, trachea, and esophagus demonstrate no significant findings. Lungs/Pleura: Emphysematous changes in the lungs. Right middle lung nodule measuring 5 mm diameter. Image 36 of series 6. This lesion was obscured by motion artifact on the prior study. No pleural effusions. No pneumothorax. Musculoskeletal: No acute bony abnormalities. Review of the MIP images confirms the above findings. CTA ABDOMEN AND PELVIS FINDINGS VASCULAR Aorta: Normal caliber aorta without aneurysm, dissection, vasculitis or significant stenosis. Celiac: Patent without evidence of aneurysm, dissection, vasculitis or significant stenosis. SMA: Patent without evidence of aneurysm, dissection, vasculitis or significant stenosis. Renals: Both renal arteries are patent without evidence of aneurysm, dissection, vasculitis, fibromuscular dysplasia or significant stenosis. IMA: Patent without evidence of aneurysm, dissection, vasculitis or significant stenosis. Inflow:  Patent without evidence of aneurysm, dissection, vasculitis or significant stenosis. Veins: No obvious venous abnormality within the limitations of this arterial phase study. Review of the MIP images confirms the above findings. NON-VASCULAR Hepatobiliary: No focal liver abnormality is seen. Status post cholecystectomy. No biliary dilatation. Pancreas: Fatty replacement of the pancreas. No acute inflammatory changes. Spleen: Normal in size  without focal abnormality. Adrenals/Urinary Tract: No adrenal gland nodules. Left renal cysts measuring 3.5 cm diameter. No imaging follow-up is indicated. No hydronephrosis or hydroureter. Bladder is normal. Stomach/Bowel: Stomach, small bowel, and colon are not abnormally distended. No wall thickening or inflammatory changes. Stool throughout the colon. Colonic diverticulosis without evidence of acute diverticulitis. Appendix is normal. Lymphatic: No significant lymphadenopathy. Reproductive: Exophytic uterine fibroid. No abnormal adnexal masses. Other: No free air or free fluid in the abdomen. Abdominal wall musculature appears intact. Musculoskeletal: Degenerative changes in the spine. No acute bony abnormalities. Review of the MIP images confirms the above findings. IMPRESSION: 1. No evidence of aneurysm or dissection involving the thoracic or abdominal aorta. 2. No evidence of significant pulmonary embolus. 3. Emphysematous changes in the lungs. 4. Right solid pulmonary nodule measuring 5 mm. Per Fleischner Society Guidelines, no routine follow-up imaging is recommended. These guidelines do not apply to immunocompromised patients and patients with cancer. Follow up in patients with significant comorbidities as clinically warranted. For lung cancer screening, adhere to Lung-RADS guidelines. Reference: Radiology. 2017; 284(1):228-43. 5. Aortic atherosclerosis. Electronically Signed   By: Burman Nieves M.D.   On: 03/25/2023 01:35   DG Chest 2 View  Result Date: 03/24/2023 CLINICAL DATA:  Dizziness. EXAM: CHEST - 2 VIEW COMPARISON:  October 16, 2022 FINDINGS: The left upper lobe is limited in evaluation secondary to the degree of film penetration. The cardiac silhouette is mildly enlarged and unchanged in size. There is marked severity calcification of the aortic arch. An artificial aortic valve is noted. Both lungs are clear. 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 No acute osseous abnormalities are  identified. IMPRESSION: No active cardiopulmonary disease. Electronically Signed   By: Aram Candela M.D.   On: 03/24/2023 23:46   CT Head Wo Contrast  Result Date: 03/07/2023 CLINICAL DATA:  Headache, new onset (Age >= 51y) EXAM: CT HEAD WITHOUT CONTRAST TECHNIQUE: Contiguous axial images were obtained from the base of the skull through the vertex without intravenous contrast. RADIATION DOSE REDUCTION: This exam was performed according to the departmental dose-optimization program which includes automated exposure control, adjustment of the mA and/or kV according to patient size and/or use of iterative reconstruction technique. COMPARISON:  CT head October 16, 2022. FINDINGS: Brain: No evidence of acute infarction, hemorrhage, hydrocephalus, extra-axial collection or mass lesion/mass effect. Remote left cerebellar infarct. Vascular: No hyperdense vessel. Skull: No acute fracture. Sinuses/Orbits: Clear sinuses.  No acute orbital findings. Other: No mastoid effusions. IMPRESSION: No evidence of acute intracranial abnormality. Electronically Signed   By: Feliberto Harts M.D.   On: 03/07/2023 08:38  \ EKG EKG Interpretation Date/Time:  Sunday March 24 2023 23:25:59 EST Ventricular Rate:  90 PR Interval:    QRS Duration:  164 QT Interval:  454 QTC Calculation: 556 R Axis:   -73  Text Interpretation: Normal sinus rhythm Left bundle branch block Confirmed by Nicanor Alcon, Letina Luckett ([id number]) on 03/24/2023 11:40:52 PM  Radiology CT HEAD WO CONTRAST ( )  Result Date: 03/25/2023 CLINICAL DATA:  Poly trauma, blunt. Dizziness and hypertension for 10 hours. Pain for 3 weeks. EXAM: CT HEAD WITHOUT CONTRAST TECHNIQUE: Contiguous axial images were  obtained from the base of the skull through the vertex without intravenous contrast. RADIATION DOSE REDUCTION: This exam was performed according to the departmental dose-optimization program which includes automated exposure control, adjustment of the mA and/or kV  according to patient size and/or use of iterative reconstruction technique. COMPARISON:  03/07/2023 FINDINGS: Brain: Diffuse cerebral atrophy. Ventricular dilatation consistent with central atrophy. Low-attenuation changes in the deep white matter consistent with small vessel ischemia. No abnormal extra-axial fluid collections. No mass effect or midline shift. Gray-white matter junctions are distinct. Basal cisterns are not effaced. No acute intracranial hemorrhage. Vascular: No hyperdense vessel or unexpected calcification. Skull: Normal. Negative for fracture or focal lesion. Sinuses/Orbits: Mild mucosal thickening in the paranasal sinuses. No acute air-fluid levels. Mastoid air cells are clear. Other: None. IMPRESSION: No acute intracranial abnormalities. Chronic atrophy and small vessel ischemic changes. Electronically Signed   By: Burman Nieves M.D.   On: 03/25/2023 02:07   CT Angio Chest/Abd/Pel for Dissection W and/or Wo Contrast  Result Date: 03/25/2023 CLINICAL DATA:  Acute aortic syndrome suspected. Dizziness and hypertension for 10 hours. Back pain for 3 weeks. EXAM: CT ANGIOGRAPHY CHEST, ABDOMEN AND PELVIS TECHNIQUE: Non-contrast CT of the chest was initially obtained. Multidetector CT imaging through the chest, abdomen and pelvis was performed using the standard protocol during bolus administration of intravenous contrast. Multiplanar reconstructed images and MIPs were obtained and reviewed to evaluate the vascular anatomy. RADIATION DOSE REDUCTION: This exam was performed according to the departmental dose-optimization program which includes automated exposure control, adjustment of the mA and/or kV according to patient size and/or use of iterative reconstruction technique. CONTRAST:  OMNIPAQUE IOHEXOL 350 MG/ML SOLN COMPARISON:  Chest radiograph 03/24/2023.  CT chest 10/16/2022 FINDINGS: CTA CHEST FINDINGS Cardiovascular: Unenhanced images of the chest demonstrate aortic valve and mitral  valve prostheses. Calcification throughout the aorta. Mild calcification of the coronary arteries. Postoperative changes in the left shoulder. Images obtained during arterial phase after intravenous contrast material administration demonstrate normal caliber thoracic aorta. No aortic dissection. Great vessel origins are patent. Central pulmonary arteries are patent without evidence of significant pulmonary embolus. Mild cardiac enlargement.  No pericardial effusions. Mediastinum/Nodes: No enlarged mediastinal, hilar, or axillary lymph nodes. Thyroid gland, trachea, and esophagus demonstrate no significant findings. Lungs/Pleura: Emphysematous changes in the lungs. Right middle lung nodule measuring 5 mm diameter. Image 36 of series 6. This lesion was obscured by motion artifact on the prior study. No pleural effusions. No pneumothorax. Musculoskeletal: No acute bony abnormalities. Review of the MIP images confirms the above findings. CTA ABDOMEN AND PELVIS FINDINGS VASCULAR Aorta: Normal caliber aorta without aneurysm, dissection, vasculitis or significant stenosis. Celiac: Patent without evidence of aneurysm, dissection, vasculitis or significant stenosis. SMA: Patent without evidence of aneurysm, dissection, vasculitis or significant stenosis. Renals: Both renal arteries are patent without evidence of aneurysm, dissection, vasculitis, fibromuscular dysplasia or significant stenosis. IMA: Patent without evidence of aneurysm, dissection, vasculitis or significant stenosis. Inflow: Patent without evidence of aneurysm, dissection, vasculitis or significant stenosis. Veins: No obvious venous abnormality within the limitations of this arterial phase study. Review of the MIP images confirms the above findings. NON-VASCULAR Hepatobiliary: No focal liver abnormality is seen. Status post cholecystectomy. No biliary dilatation. Pancreas: Fatty replacement of the pancreas. No acute inflammatory changes. Spleen: Normal in size  without focal abnormality. Adrenals/Urinary Tract: No adrenal gland nodules. Left renal cysts measuring 3.5 cm diameter. No imaging follow-up is indicated. No hydronephrosis or hydroureter. Bladder is normal. Stomach/Bowel: Stomach, small bowel, and colon are  not abnormally distended. No wall thickening or inflammatory changes. Stool throughout the colon. Colonic diverticulosis without evidence of acute diverticulitis. Appendix is normal. Lymphatic: No significant lymphadenopathy. Reproductive: Exophytic uterine fibroid. No abnormal adnexal masses. Other: No free air or free fluid in the abdomen. Abdominal wall musculature appears intact. Musculoskeletal: Degenerative changes in the spine. No acute bony abnormalities. Review of the MIP images confirms the above findings. IMPRESSION: 1. No evidence of aneurysm or dissection involving the thoracic or abdominal aorta. 2. No evidence of significant pulmonary embolus. 3. Emphysematous changes in the lungs. 4. Right solid pulmonary nodule measuring 5 mm. Per Fleischner Society Guidelines, no routine follow-up imaging is recommended. These guidelines do not apply to immunocompromised patients and patients with cancer. Follow up in patients with significant comorbidities as clinically warranted. For lung cancer screening, adhere to Lung-RADS guidelines. Reference: Radiology. 2017; 284(1):228-43. 5. Aortic atherosclerosis. Electronically Signed   By: Burman Nieves M.D.   On: 03/25/2023 01:35   DG Chest 2 View  Result Date: 03/24/2023 CLINICAL DATA:  Dizziness. EXAM: CHEST - 2 VIEW COMPARISON:  October 16, 2022 FINDINGS: The left upper lobe is limited in evaluation secondary to the degree of film penetration. The cardiac silhouette is mildly enlarged and unchanged in size. There is marked severity calcification of the aortic arch. An artificial aortic valve is noted. Both lungs are clear. 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 No acute osseous abnormalities are  identified. IMPRESSION: No active cardiopulmonary disease. Electronically Signed   By: Aram Candela M.D.   On: 03/24/2023 23:46    Procedures Procedures    Medications Ordered in ED Medications  trimethobenzamide (TIGAN) injection 200 mg (200 mg Intramuscular Given 03/25/23 0106)  fentaNYL (SUBLIMAZE) injection 25 mcg (25 mcg Intravenous Given 03/24/23 2350)  LORazepam (ATIVAN) injection 0.5 mg (0.5 mg Intravenous Given 03/25/23 0042)  iohexol (OMNIPAQUE) 350 MG/ML injection 100 mL (100 mLs Intravenous Contrast Given 03/25/23 0112)  magnesium sulfate IVPB 2 g 50 mL (0 g Intravenous Stopped 03/25/23 0225)  ketorolac (TORADOL) 30 MG/ML injection 30 mg (30 mg Intravenous Given 03/25/23 0240)  methocarbamol (ROBAXIN) injection 1,000 mg (1,000 mg Intravenous Given 03/25/23 0246)    ED Course/ Medical Decision Making/ A&P                                 Medical Decision Making Patient with back pain and hip pain with nausea   Amount and/or Complexity of Data Reviewed Independent Historian: EMS    Details: See above  External Data Reviewed: notes.    Details: Previous notes reviewed  Labs: ordered.    Details: Urine is negative for UTI.  2 elevated troponins 28/26.  Normal white count 6.4, low hemoglobin 10.5, normal platelets. Normal sodium 139, normal potassium normal creatinine 0.89 Radiology: ordered and independent interpretation performed.    Details: Negative head CT and dissection study by me  ECG/medicine tests: ordered and independent interpretation performed. Decision-making details documented in ED Course.  Risk Prescription drug management. Decision regarding hospitalization.    Final Clinical Impression(s) / ED Diagnoses Final diagnoses:  QT prolongation  Elevated troponin I level   The patient appears reasonably stabilized for admission considering the current resources, flow, and capabilities available in the ED at this time, and I doubt any other [facility] requiring  further screening and/or treatment in the ED prior to admission.  Rx / DC Orders ED Discharge Orders  None         Shemuel Harkleroad, MD 03/25/23 [phone]

## 2023-03-25 NOTE — H&P (Signed)
PCP:   Shirline Frees, NP   Chief Complaint:  Pain  HPI: This a 77 year old female past medical history significant for T2DM, HFpEF, Parkinson's syndrome, PAF on chronic anticoag, HLD, CAD s/p PCI/DES x1 to mRCA (03/2018), mitral valve endocarditis 09/2018,  hypothyroidism, OSA on CPaP, IDA, Anxiety, NASH cirrhosis,  pulm HTN, embolic cerebellar CVA 2020, h/o CVA, h/o TAVR 2020.   Patient with chronic pain.  Pain and back, hip, knees.  Patient with chronic headache.  Patient follows with [facility] neurology.  Patient being seen by rheumatologist evaluating for rheumatoid arthritis in addition to her severe osteoarthritis.  Patient was recently given an annual infusion [infusion unknown] to assist with pain.  Per patient her symptoms have progressively worsened since.  She was scheduled for MRI brain for this chronic migraine however patient with high anxiety and claustrophobia therefore facility unable to perform MRI.  Plans underway for outpatient MRI.  Over the past 3 days patient's pain has significantly intensified.  Her headache is intractable.  Her lower back/hip pain is the same.  She has significant nausea.  She came to the ER.  Per patient she has been on narcotics oxycodone but it did not help and she was told it was due to addiction issues.  She has prescriptions but she does not take it at the regular basis.  She does take Zanaflex occasionally.  Per patient she does not take benzos.  Overnight in the ER patient very difficult to manage due to anxiety, pain, agitation.  Patient eventually given 0.5 of Ativan for imaging, IV Robaxin, 25 mics of fentanyl in order for imaging to be done.  Patient sedated.  Admission requested for heart score of 7 with mildly elevated troponins.  However now that I am able to speak with patient, patient's main issues is pain. Troponins mildly elevated but flat 28 => 26.  EKG sinus tach, QTc 526.  ST ischemic segment changes    Review of Systems:  Unable to  obtain  Past Medical History: Past Medical History:  Diagnosis Date   Anxiety    Arthritis    "back" (04/22/2018)   Atrial fibrillation with RVR (HCC) 10/21/2018   Back pain    Bacteremia due to Streptococcus Salivarius 10/07/2018   Blood transfusion without reported diagnosis    CAD (coronary artery disease)    a. 03/2018 s/p PCI/DES to the RCA (3.0x15 Onyx DES).   Carotid artery stenosis    Mild   Cerebellar stroke, acute (HCC) 10/21/2018   Cerebrovascular accident (CVA) due to embolism of right posterior cerebral artery (HCC) 08/03/2020   Chest pain    CHF (congestive heart failure) (HCC)    Chronic lower back pain    Cirrhosis (HCC)    Colon polyps    Diverticulitis    Diverticulosis    Esophageal thickening    seen on pre TAVR CT scan, also questionable cirrhosis. MRI recommended. Will refer to GI   GERD (gastroesophageal reflux disease)    Gout    Grave's disease    H/O total shoulder replacement, left 06/01/2022   Heart murmur    History of cardioembolic stroke 03/29/2020   History of colonic polyps 05/22/2017   History of hiatal hernia    Hyperlipidemia    Hypertension    Hypothyroidism    IBS (irritable bowel syndrome)    Osteopenia    Parkinson's syndrome (HCC) 04/23/2020   Pulmonary nodules    seen on pre TAVR CT. likley benign. no follow up recommended if  pt low risk.   S/P TAVR (transcatheter aortic valve replacement)    Shortness of breath on exertion    Stroke (HCC) 2021   x2   Thalassemia minor    Thyroid disease    Type II diabetes mellitus (HCC)    Past Surgical History:  Procedure Laterality Date   72 HOUR PH STUDY N/A 03/03/2018   Procedure: 24 HOUR PH STUDY;  Surgeon: Napoleon Form, MD;  Location: [facility] ENDOSCOPY;  Service: Endoscopy;  Laterality: N/A;   COLONOSCOPY  2023   COLONOSCOPY W/ BIOPSIES AND POLYPECTOMY     CORONARY ANGIOGRAPHY Right 04/21/2018   Procedure: CORONARY ANGIOGRAPHY (CATH LAB);  Surgeon: Lyn Records, MD;   Location: [facility] INVASIVE CV LAB;  Service: Cardiovascular;  Laterality: Right;   CORONARY STENT INTERVENTION N/A 04/22/2018   Procedure: CORONARY STENT INTERVENTION;  Surgeon: Lyn Records, MD;  Location: [facility] INVASIVE CV LAB;  Service: Cardiovascular;  Laterality: N/A;   DILATION AND CURETTAGE OF UTERUS     ESOPHAGEAL MANOMETRY N/A 03/03/2018   Procedure: ESOPHAGEAL MANOMETRY (EM);  Surgeon: Napoleon Form, MD;  Location: [facility] ENDOSCOPY;  Service: Endoscopy;  Laterality: N/A;   HYSTEROSCOPY     fibroids   LAPAROSCOPIC CHOLECYSTECTOMY  1985   LAPAROSCOPY     fibroids   NISSEN FUNDOPLICATION  1990s   POLYPECTOMY     REVERSE SHOULDER ARTHROPLASTY Left 06/01/2022   Procedure: REVERSE SHOULDER ARTHROPLASTY;  Surgeon: Beverely Low, MD;  Location: [facility] ORS;  Service: Orthopedics;  Laterality: Left;  choice with interscalene block   RIGHT/LEFT HEART CATH AND CORONARY ANGIOGRAPHY N/A 02/20/2018   Procedure: RIGHT/LEFT HEART CATH AND CORONARY ANGIOGRAPHY;  Surgeon: Lyn Records, MD;  Location: [facility] INVASIVE CV LAB;  Service: Cardiovascular;  Laterality: N/A;   TEE WITHOUT CARDIOVERSION N/A 07/08/2018   Procedure: TRANSESOPHAGEAL ECHOCARDIOGRAM (TEE);  Surgeon: Kathleene Hazel, MD;  Location: [facility] OR;  Service: Open Heart Surgery;  Laterality: N/A;   TEE WITHOUT CARDIOVERSION  10/07/2018   TEE WITHOUT CARDIOVERSION N/A 10/07/2018   Procedure: TRANSESOPHAGEAL ECHOCARDIOGRAM (TEE);  Surgeon: Jake Bathe, MD;  Location: [facility] ENDOSCOPY;  Service: Cardiovascular;  Laterality: N/A;   TONSILLECTOMY     age 14   TRANSCATHETER AORTIC VALVE REPLACEMENT, TRANSFEMORAL N/A 07/08/2018   Procedure: TRANSCATHETER AORTIC VALVE REPLACEMENT, TRANSFEMORAL;  Surgeon: Kathleene Hazel, MD;  Location: [facility] OR;  Service: Open Heart Surgery;  Laterality: N/A;    Medications: Prior to Admission medications   Medication Sig Start Date End Date Taking? Authorizing Provider  alendronate (FOSAMAX) 70 MG tablet Take  1 tablet (70 mg total) by mouth once a week. 01/30/23     allopurinol (ZYLOPRIM) 100 MG tablet Take 1 tablet (100 mg total) by mouth daily. 02/28/23     ALPRAZolam (XANAX) 0.5 MG tablet Take one tablet 30-60 minutes before MRI. Can take a second tablet if needed right before MRI Patient not taking: Reported on 03/07/2023 03/01/23   Shirline Frees, NP  amitriptyline (ELAVIL) 25 MG tablet Take 1 tablet (25 mg total) by mouth at bedtime. 01/17/23   Nafziger, Kandee Keen, NP  apixaban (ELIQUIS) 5 MG TABS tablet Take 1 tablet (5 mg total) by mouth 2 (two) times daily. 02/21/23   Shirline Frees, NP  b complex vitamins capsule Take 1 capsule by mouth daily.    [provider]  colchicine 0.6 MG tablet Take 1 tablet (0.6 mg total) by mouth daily. as needed for gout 02/28/23     diltiazem (CARTIA XT) 120  MG 24 hr capsule Take 1 capsule (120 mg total) by mouth daily. 02/03/23   Ronney Asters, NP  Insulin NPH, Human,, Isophane, (HUMULIN N KWIKPEN) 100 UNIT/ML Kiwkpen Inject 20 units into the skin every morning and 14 units every evening. 11/01/22     insulin regular (HUMULIN R) 100 units/mL injection Inject 0.1 mLs (10 Units total) into the skin 3 (three) times daily before meals 01/09/23     Insulin Syringe-Needle U-100 (ULTICARE INSULIN SYRINGE) 31G X 5/16" 1 ML MISC Inject 4 times daily subcutaneously 12/04/22   Nafziger, Kandee Keen, NP  metFORMIN (GLUCOPHAGE-XR) 500 MG 24 hr tablet Take 1 tablet by mouth 2 times daily 08/29/22     Multiple Vitamin (MULITIVITAMIN WITH MINERALS) TABS Take 1 tablet by mouth daily.    [provider]  nitroGLYCERIN (NITROSTAT) 0.4 MG SL tablet Place 1 tablet (0.4 mg total) under the tongue every 5 (five) minutes as needed for chest pain. 11/14/21   Lyn Records, MD  omeprazole (PRILOSEC) 20 MG capsule Take 1 capsule (20 mg total) by mouth daily. 03/06/23   [name], Willaim Rayas, MD  polyethylene glycol (MIRALAX) 17 g packet Take 17 g by mouth daily as needed. 12/19/22    [name], Willaim Rayas, MD  potassium chloride SA (KLOR-CON M) 20 MEQ tablet Take 1 tablet (20 mEq total) by mouth daily. Take in concurrence with torsemide. 11/07/22   Nafziger, Kandee Keen, NP  simvastatin (ZOCOR) 10 MG tablet Take 2 tablets (20 mg total) by mouth daily. Patient not taking: Reported on 03/07/2023 02/08/23   Micki Riley, MD  SYNTHROID 137 MCG tablet Take 1 tablet (137 mcg total) by mouth every morning on empty stomach. 01/24/23     tiZANidine (ZANAFLEX) 4 MG tablet Take 1 tablet (4 mg total) by mouth 3 (three) times daily. 01/09/23     torsemide (DEMADEX) 20 MG tablet Take 2 tablets (40 mg total) by mouth daily. 10/08/22   Ronney Asters, NP  Vibegron (GEMTESA) 75 MG TABS Take 75 mg by mouth at bedtime.    [provider]    Allergies:  No Known Allergies  Social History:  reports that she has never smoked. She has never been exposed to tobacco smoke. She has never used smokeless tobacco. She reports that she does not drink alcohol and does not use drugs.  Family History: Family History  Adopted: Yes  Problem Relation Age of Onset   Healthy Son        x 2   Headache Other        Cluster headaches   Heart failure Mother    Colon cancer Neg Hx    Pancreatic cancer Neg Hx    Rectal cancer Neg Hx    Stomach cancer Neg Hx     Physical Exam: Vitals:   03/24/23 2345 03/25/23 0030 03/25/23 0130 03/25/23 0230  BP: (!) 153/92 136/76 136/75 (!) 151/78  Pulse: 84 87 84 88  Resp: (!) 22 19 19 19   Temp:      TempSrc:      SpO2: 99% 99% 99% 100%  Weight:      Height:        General: Arousable but sedated patient. well developed and nourished, no acute distress Eyes: Pink conjunctiva, no scleral icterus ENT: Moist oral mucosa, neck supple, no thyromegaly Lungs: CTA B/L, no wheeze, no crackles, no use of accessory muscles Cardiovascular: RRR, no regurgitation, no gallops, no murmurs. No carotid bruits, no JVD Abdomen: soft, positive  BS, NTND, , not an acute  abdomen GU: not examined Neuro: CN II - XII appears grossly intact Musculoskeletal: +2 B/L LE pitting edema.  Tender to manipulation Skin: no rash, no subcutaneous crepitation, no decubitus Psych: Sedated patient   Labs on Admission:  Recent Labs    03/24/23 2258  NA 139  K 3.9  CL 107  CO2 21*  GLUCOSE 133*  BUN 15  CREATININE 0.89  CALCIUM 10.3  MG 1.7    Recent Labs    03/24/23 2258  WBC 6.4  HGB 10.5*  HCT 33.7*  MCV 60.6*  PLT 197    Radiological Exams on Admission: CT HEAD WO CONTRAST ( )  Result Date: 03/25/2023 CLINICAL DATA:  Poly trauma, blunt. Dizziness and hypertension for 10 hours. Pain for 3 weeks. EXAM: CT HEAD WITHOUT CONTRAST TECHNIQUE: Contiguous axial images were obtained from the base of the skull through the vertex without intravenous contrast. RADIATION DOSE REDUCTION: This exam was performed according to the departmental dose-optimization program which includes automated exposure control, adjustment of the mA and/or kV according to patient size and/or use of iterative reconstruction technique. COMPARISON:  03/07/2023 FINDINGS: Brain: Diffuse cerebral atrophy. Ventricular dilatation consistent with central atrophy. Low-attenuation changes in the deep white matter consistent with small vessel ischemia. No abnormal extra-axial fluid collections. No mass effect or midline shift. Gray-white matter junctions are distinct. Basal cisterns are not effaced. No acute intracranial hemorrhage. Vascular: No hyperdense vessel or unexpected calcification. Skull: Normal. Negative for fracture or focal lesion. Sinuses/Orbits: Mild mucosal thickening in the paranasal sinuses. No acute air-fluid levels. Mastoid air cells are clear. Other: None. IMPRESSION: No acute intracranial abnormalities. Chronic atrophy and small vessel ischemic changes. Electronically Signed   By: Burman Nieves M.D.   On: 03/25/2023 02:07   CT Angio Chest/Abd/Pel for Dissection W and/or Wo  Contrast  Result Date: 03/25/2023 CLINICAL DATA:  Acute aortic syndrome suspected. Dizziness and hypertension for 10 hours. Back pain for 3 weeks. EXAM: CT ANGIOGRAPHY CHEST, ABDOMEN AND PELVIS TECHNIQUE: Non-contrast CT of the chest was initially obtained. Multidetector CT imaging through the chest, abdomen and pelvis was performed using the standard protocol during bolus administration of intravenous contrast. Multiplanar reconstructed images and MIPs were obtained and reviewed to evaluate the vascular anatomy. RADIATION DOSE REDUCTION: This exam was performed according to the departmental dose-optimization program which includes automated exposure control, adjustment of the mA and/or kV according to patient size and/or use of iterative reconstruction technique. CONTRAST:  OMNIPAQUE IOHEXOL 350 MG/ML SOLN COMPARISON:  Chest radiograph 03/24/2023.  CT chest 10/16/2022 FINDINGS: CTA CHEST FINDINGS Cardiovascular: Unenhanced images of the chest demonstrate aortic valve and mitral valve prostheses. Calcification throughout the aorta. Mild calcification of the coronary arteries. Postoperative changes in the left shoulder. Images obtained during arterial phase after intravenous contrast material administration demonstrate normal caliber thoracic aorta. No aortic dissection. Great vessel origins are patent. Central pulmonary arteries are patent without evidence of significant pulmonary embolus. Mild cardiac enlargement.  No pericardial effusions. Mediastinum/Nodes: No enlarged mediastinal, hilar, or axillary lymph nodes. Thyroid gland, trachea, and esophagus demonstrate no significant findings. Lungs/Pleura: Emphysematous changes in the lungs. Right middle lung nodule measuring 5 mm diameter. Image 36 of series 6. This lesion was obscured by motion artifact on the prior study. No pleural effusions. No pneumothorax. Musculoskeletal: No acute bony abnormalities. Review of the MIP images confirms the above findings.  CTA ABDOMEN AND PELVIS FINDINGS VASCULAR Aorta: Normal caliber aorta without aneurysm, dissection, vasculitis or significant stenosis.  Celiac: Patent without evidence of aneurysm, dissection, vasculitis or significant stenosis. SMA: Patent without evidence of aneurysm, dissection, vasculitis or significant stenosis. Renals: Both renal arteries are patent without evidence of aneurysm, dissection, vasculitis, fibromuscular dysplasia or significant stenosis. IMA: Patent without evidence of aneurysm, dissection, vasculitis or significant stenosis. Inflow: Patent without evidence of aneurysm, dissection, vasculitis or significant stenosis. Veins: No obvious venous abnormality within the limitations of this arterial phase study. Review of the MIP images confirms the above findings. NON-VASCULAR Hepatobiliary: No focal liver abnormality is seen. Status post cholecystectomy. No biliary dilatation. Pancreas: Fatty replacement of the pancreas. No acute inflammatory changes. Spleen: Normal in size without focal abnormality. Adrenals/Urinary Tract: No adrenal gland nodules. Left renal cysts measuring 3.5 cm diameter. No imaging follow-up is indicated. No hydronephrosis or hydroureter. Bladder is normal. Stomach/Bowel: Stomach, small bowel, and colon are not abnormally distended. No wall thickening or inflammatory changes. Stool throughout the colon. Colonic diverticulosis without evidence of acute diverticulitis. Appendix is normal. Lymphatic: No significant lymphadenopathy. Reproductive: Exophytic uterine fibroid. No abnormal adnexal masses. Other: No free air or free fluid in the abdomen. Abdominal wall musculature appears intact. Musculoskeletal: Degenerative changes in the spine. No acute bony abnormalities. Review of the MIP images confirms the above findings. IMPRESSION: 1. No evidence of aneurysm or dissection involving the thoracic or abdominal aorta. 2. No evidence of significant pulmonary embolus. 3. Emphysematous  changes in the lungs. 4. Right solid pulmonary nodule measuring 5 mm. Per Fleischner Society Guidelines, no routine follow-up imaging is recommended. These guidelines do not apply to immunocompromised patients and patients with cancer. Follow up in patients with significant comorbidities as clinically warranted. For lung cancer screening, adhere to Lung-RADS guidelines. Reference: Radiology. 2017; 284(1):228-43. 5. Aortic atherosclerosis. Electronically Signed   By: Burman Nieves M.D.   On: 03/25/2023 01:35   DG Chest 2 View  Result Date: 03/24/2023 CLINICAL DATA:  Dizziness. EXAM: CHEST - 2 VIEW COMPARISON:  October 16, 2022 FINDINGS: The left upper lobe is limited in evaluation secondary to the degree of film penetration. The cardiac silhouette is mildly enlarged and unchanged in size. There is marked severity calcification of the aortic arch. An artificial aortic valve is noted. Both lungs are clear. 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 No acute osseous abnormalities are identified. IMPRESSION: No active cardiopulmonary disease. Electronically Signed   By: Aram Candela M.D.   On: 03/24/2023 23:46    Assessment/Plan Present on Admission:  Intractable pain knees, low back, hip//intractable headache. -2D echo ordered previously.  Unable to cancel. -Scheduled Tylenol, schedule Zanaflex.  Patient is previously on Zanaflex -As needed Dilaudid. -CT chest/abdomen/pelvis done on revealing.  No significant arthritic changes noted. -Patient may benefit from a fentanyl patch until pain issues worked out.  I am unclear if pain management is available at [facility].  If available a.m. team to consider consult. -Migraine cocktail ordered. -Difficult situation.  Patient with high anxiety, real pain, unclear cause.  Being followed by neurology, rheumatology.  Was being seen by pain management.   HFpEF//CAD -Stable, no no evidence of exacerbation -Demadex resumed -Simvastatin,   Chronic intermittent  hypoxia with obstructive sleep apnea -BiPAP reordered   Paroxysmal atrial fibrillation (HCC) -Diltiazem, Eliquis resumed   Hypothyroidism -Synthroid was resumed   History of mini strokes with subsequent tremors, parkinsonian syndrome -Per neurology  -Eliquis and statin resumed  Breanna Webster 03/25/2023, 3:12 AM

## 2023-03-25 NOTE — Hospital Course (Signed)
      T2DM, HFpEF, Parkinson's syndrome, PAF on chronic anticoag, HLD, CAD s/p PCI/DES x1 to mRCA (03/2018), mitral valve endocarditis 09/2018,  hypothyroidism, OSA on CPaP, IDA, Anxiety, NASH cirrhosis,  pulm HTN, embolic cerebellar CVA 2020, h/o CVA, h/o TAVR 2020

## 2023-03-25 NOTE — ED Notes (Signed)
Patient to ED via EMS with complaint of dizziness, and HTN x 10 hours. Patient reports back pain x 3 weeks

## 2023-03-25 NOTE — ED Notes (Signed)
Patient assisted to [facility].

## 2023-03-25 NOTE — Telephone Encounter (Signed)
Patient's husband states if I can please let Kandee Keen know that pt never got a response to her message and would like at least a response back to her.

## 2023-03-25 NOTE — Care Management Obs Status (Signed)
MEDICARE OBSERVATION STATUS NOTIFICATION   Patient Details  Name: Breanna Webster MRN: [id number] Date of Birth: 11-28-1945   Medicare Observation Status Notification Given:  Yes    Oletta Cohn, RN 03/25/2023, 10:28 AM

## 2023-03-25 NOTE — Discharge Summary (Addendum)
Breanna Webster SWF:[id number] DOB: 03/03/1946 DOA: 03/24/2023  PCP: Shirline Frees, NP  Admit date: 03/24/2023 Discharge date: 03/25/2023  Time spent: 35 minutes  Recommendations for Outpatient Follow-up:  Close PCP f/u     Discharge Diagnoses:  Principal Problem:   Low back pain Active Problems:   Hypothyroidism   (HFpEF) heart failure with preserved ejection fraction (HCC)   Paroxysmal atrial fibrillation (HCC)   Anxiety   Chronic intermittent hypoxia with obstructive sleep apnea   OSA treated with BiPAP   Left hemiparesis (HCC)   Parkinsonism (HCC)   Type 2 diabetes mellitus with other specified complication, with long-term current use of insulin (HCC)   Discharge Condition: stable  Diet recommendation: heart healthy  Filed Weights   03/24/23 2245  Weight: 79 kg    History of present illness:  This a 77 year old female past medical history significant for T2DM, HFpEF, Parkinson's syndrome, PAF on chronic anticoag, HLD, CAD s/p PCI/DES x1 to mRCA (03/2018), mitral valve endocarditis 09/2018,  hypothyroidism, OSA on CPaP, IDA, Anxiety, NASH cirrhosis,  pulm HTN, embolic cerebellar CVA 2020, h/o CVA, h/o TAVR 2020.   Patient with chronic pain.  Pain and back, hip, knees.  Patient with chronic headache.  Patient follows with [facility] neurology.  Patient being seen by rheumatologist evaluating for rheumatoid arthritis in addition to her severe osteoarthritis.  Patient was recently given an annual infusion [infusion unknown] to assist with pain.  Per patient her symptoms have progressively worsened since.  She was scheduled for MRI brain for this chronic migraine however patient with high anxiety and claustrophobia therefore facility unable to perform MRI.  Plans underway for outpatient MRI.  Over the past 3 days patient's pain has significantly intensified.  Her headache is intractable.  Her lower back/hip pain is the same.  She has significant nausea.  She came to the ER.  Per  patient she has been on narcotics oxycodone but it did not help and she was told it was due to addiction issues.  She has prescriptions but she does not take it at the regular basis.  She does take Zanaflex occasionally.  Per patient she does not take benzos.   Overnight in the ER patient very difficult to manage due to anxiety, pain, agitation.  Patient eventually given 0.5 of Ativan for imaging, IV Robaxin, 25 mics of fentanyl in order for imaging to be done.  Patient sedated.  Admission requested for heart score of 7 with mildly elevated troponins.  However now that I am able to speak with patient, patient's main issues is pain. Troponins mildly elevated but flat 28 => 26.  EKG sinus tach, QTc 526.  ST ischemic segment changes  Hospital Course:  Patient presents with myriad complaints. Patient says her primary issues were inability to urinate and chronic low back pain. Husband says she has been nauseaus and complaining of room spinning. Apparently main complaint to admitting provider is chronic pain though by day of discharge her chronic pain was stable. Here she was able to urinate several times and tolerated liquids, no vomiting, no lower extremity weakness/numbness.  Followed by multiple specialists outpatient, can't tolerate a closed mri so an open mri is scheduled. Here we obtained labs and CT of head, chest, abdomen, and pelvis. No acute problems identified. Polypharmacy likely contributing, advised close PCP f/u to attempt to wean of non-essential meds.   Procedures: none   Consultations: none  Discharge Exam: Vitals:   03/25/23 0502 03/25/23 0545  BP:  136/67  Pulse:  87  Resp:  18  Temp: 98.1 F (36.7 C)   SpO2:  99%    General: NAD Cardiovascular: RRR soft systolic murmur Respiratory: CTAB Abdomen: obese, soft Ext: 1+ LE edema  Discharge Instructions   Discharge Instructions     Diet - low sodium heart healthy   Complete by: As directed    Increase activity slowly    Complete by: As directed       Allergies as of 03/25/2023   No Known Allergies      Medication List     TAKE these medications    acetaminophen 500 MG tablet Commonly known as: TYLENOL Take 1,000 mg by mouth every 6 (six) hours as needed for moderate pain (pain score 4-6).   alendronate 70 MG tablet Commonly known as: FOSAMAX Take 1 tablet (70 mg total) by mouth once a week.   allopurinol 100 MG tablet Commonly known as: ZYLOPRIM Take 1 tablet (100 mg total) by mouth daily.   ALPRAZolam 0.5 MG tablet Commonly known as: Xanax Take one tablet 30-60 minutes before MRI. Can take a second tablet if needed right before MRI   amitriptyline 25 MG tablet Commonly known as: ELAVIL Take 1 tablet (25 mg total) by mouth at bedtime.   b complex vitamins capsule Take 1 capsule by mouth daily.   colchicine 0.6 MG tablet Take 1 tablet (0.6 mg total) by mouth daily. as needed for gout   D3 PO Take 1 tablet by mouth daily.   diltiazem 120 MG 24 hr capsule Commonly known as: Cartia XT Take 1 capsule (120 mg total) by mouth daily.   Eliquis 5 MG Tabs tablet Generic drug: apixaban Take 1 tablet (5 mg total) by mouth 2 (two) times daily.   Gemtesa 75 MG Tabs Generic drug: Vibegron Take 75 mg by mouth at bedtime.   HumuLIN N KwikPen 100 UNIT/ML KwikPen Generic drug: Insulin NPH (Human) (Isophane) Inject 20 units into the skin every morning and 14 units every evening.   HumuLIN R 100 UNIT/ML injection Generic drug: insulin regular Inject 0.1 mLs (10 Units total) into the skin 3 (three) times daily before meals   metFORMIN 500 MG 24 hr tablet Commonly known as: GLUCOPHAGE-XR Take 1 tablet by mouth 2 times daily   multivitamin with minerals Tabs tablet Take 1 tablet by mouth daily.   nitroGLYCERIN 0.4 MG SL tablet Commonly known as: NITROSTAT Place 1 tablet (0.4 mg total) under the tongue every 5 (five) minutes as needed for chest pain.   omeprazole 20 MG  capsule Commonly known as: PRILOSEC Take 1 capsule (20 mg total) by mouth daily.   polyethylene glycol 17 g packet Commonly known as: MiraLax Take 17 g by mouth daily as needed.   potassium chloride SA 20 MEQ tablet Commonly known as: KLOR-CON M Take 1 tablet (20 mEq total) by mouth daily. Take in concurrence with torsemide.   simvastatin 10 MG tablet Commonly known as: Zocor Take 2 tablets (20 mg total) by mouth daily.   Synthroid 137 MCG tablet Generic drug: levothyroxine Take 1 tablet (137 mcg total) by mouth every morning on empty stomach.   tiZANidine 4 MG tablet Commonly known as: ZANAFLEX Take 1 tablet (4 mg total) by mouth 3 (three) times daily. What changed:  when to take this reasons to take this   torsemide 20 MG tablet Commonly known as: DEMADEX Take 2 tablets (40 mg total) by mouth daily. What changed: how much to take  UltiCare Insulin Syringe 31G X 5/16" 1 ML Misc Generic drug: Insulin Syringe-Needle U-100 Inject 4 times daily subcutaneously   vitamin E 180 MG (400 UNITS) capsule Take 400 Units by mouth daily.       No Known Allergies  Follow-up Information     Shirline Frees, NP .   Specialty: Family Medicine Contact information: [address] [place] [address] [phone]                  The results of significant diagnostics from this hospitalization (including imaging, microbiology, ancillary and laboratory) are listed below for reference.    Significant Diagnostic Studies: CT HEAD WO CONTRAST ( )  Result Date: 03/25/2023 CLINICAL DATA:  Poly trauma, blunt. Dizziness and hypertension for 10 hours. Pain for 3 weeks. EXAM: CT HEAD WITHOUT CONTRAST TECHNIQUE: Contiguous axial images were obtained from the base of the skull through the vertex without intravenous contrast. RADIATION DOSE REDUCTION: This exam was performed according to the departmental dose-optimization program which includes automated exposure control,  adjustment of the mA and/or kV according to patient size and/or use of iterative reconstruction technique. COMPARISON:  03/07/2023 FINDINGS: Brain: Diffuse cerebral atrophy. Ventricular dilatation consistent with central atrophy. Low-attenuation changes in the deep white matter consistent with small vessel ischemia. No abnormal extra-axial fluid collections. No mass effect or midline shift. Gray-white matter junctions are distinct. Basal cisterns are not effaced. No acute intracranial hemorrhage. Vascular: No hyperdense vessel or unexpected calcification. Skull: Normal. Negative for fracture or focal lesion. Sinuses/Orbits: Mild mucosal thickening in the paranasal sinuses. No acute air-fluid levels. Mastoid air cells are clear. Other: None. IMPRESSION: No acute intracranial abnormalities. Chronic atrophy and small vessel ischemic changes. Electronically Signed   By: Burman Nieves M.D.   On: 03/25/2023 02:07   CT Angio Chest/Abd/Pel for Dissection W and/or Wo Contrast  Result Date: 03/25/2023 CLINICAL DATA:  Acute aortic syndrome suspected. Dizziness and hypertension for 10 hours. Back pain for 3 weeks. EXAM: CT ANGIOGRAPHY CHEST, ABDOMEN AND PELVIS TECHNIQUE: Non-contrast CT of the chest was initially obtained. Multidetector CT imaging through the chest, abdomen and pelvis was performed using the standard protocol during bolus administration of intravenous contrast. Multiplanar reconstructed images and MIPs were obtained and reviewed to evaluate the vascular anatomy. RADIATION DOSE REDUCTION: This exam was performed according to the departmental dose-optimization program which includes automated exposure control, adjustment of the mA and/or kV according to patient size and/or use of iterative reconstruction technique. CONTRAST:  OMNIPAQUE IOHEXOL 350 MG/ML SOLN COMPARISON:  Chest radiograph 03/24/2023.  CT chest 10/16/2022 FINDINGS: CTA CHEST FINDINGS Cardiovascular: Unenhanced images of the chest  demonstrate aortic valve and mitral valve prostheses. Calcification throughout the aorta. Mild calcification of the coronary arteries. Postoperative changes in the left shoulder. Images obtained during arterial phase after intravenous contrast material administration demonstrate normal caliber thoracic aorta. No aortic dissection. Great vessel origins are patent. Central pulmonary arteries are patent without evidence of significant pulmonary embolus. Mild cardiac enlargement.  No pericardial effusions. Mediastinum/Nodes: No enlarged mediastinal, hilar, or axillary lymph nodes. Thyroid gland, trachea, and esophagus demonstrate no significant findings. Lungs/Pleura: Emphysematous changes in the lungs. Right middle lung nodule measuring 5 mm diameter. Image 36 of series 6. This lesion was obscured by motion artifact on the prior study. No pleural effusions. No pneumothorax. Musculoskeletal: No acute bony abnormalities. Review of the MIP images confirms the above findings. CTA ABDOMEN AND PELVIS FINDINGS VASCULAR Aorta: Normal caliber aorta without aneurysm, dissection, vasculitis or significant stenosis. Celiac:  Patent without evidence of aneurysm, dissection, vasculitis or significant stenosis. SMA: Patent without evidence of aneurysm, dissection, vasculitis or significant stenosis. Renals: Both renal arteries are patent without evidence of aneurysm, dissection, vasculitis, fibromuscular dysplasia or significant stenosis. IMA: Patent without evidence of aneurysm, dissection, vasculitis or significant stenosis. Inflow: Patent without evidence of aneurysm, dissection, vasculitis or significant stenosis. Veins: No obvious venous abnormality within the limitations of this arterial phase study. Review of the MIP images confirms the above findings. NON-VASCULAR Hepatobiliary: No focal liver abnormality is seen. Status post cholecystectomy. No biliary dilatation. Pancreas: Fatty replacement of the pancreas. No acute  inflammatory changes. Spleen: Normal in size without focal abnormality. Adrenals/Urinary Tract: No adrenal gland nodules. Left renal cysts measuring 3.5 cm diameter. No imaging follow-up is indicated. No hydronephrosis or hydroureter. Bladder is normal. Stomach/Bowel: Stomach, small bowel, and colon are not abnormally distended. No wall thickening or inflammatory changes. Stool throughout the colon. Colonic diverticulosis without evidence of acute diverticulitis. Appendix is normal. Lymphatic: No significant lymphadenopathy. Reproductive: Exophytic uterine fibroid. No abnormal adnexal masses. Other: No free air or free fluid in the abdomen. Abdominal wall musculature appears intact. Musculoskeletal: Degenerative changes in the spine. No acute bony abnormalities. Review of the MIP images confirms the above findings. IMPRESSION: 1. No evidence of aneurysm or dissection involving the thoracic or abdominal aorta. 2. No evidence of significant pulmonary embolus. 3. Emphysematous changes in the lungs. 4. Right solid pulmonary nodule measuring 5 mm. Per Fleischner Society Guidelines, no routine follow-up imaging is recommended. These guidelines do not apply to immunocompromised patients and patients with cancer. Follow up in patients with significant comorbidities as clinically warranted. For lung cancer screening, adhere to Lung-RADS guidelines. Reference: Radiology. 2017; 284(1):228-43. 5. Aortic atherosclerosis. Electronically Signed   By: Burman Nieves M.D.   On: 03/25/2023 01:35   DG Chest 2 View  Result Date: 03/24/2023 CLINICAL DATA:  Dizziness. EXAM: CHEST - 2 VIEW COMPARISON:  October 16, 2022 FINDINGS: The left upper lobe is limited in evaluation secondary to the degree of film penetration. The cardiac silhouette is mildly enlarged and unchanged in size. There is marked severity calcification of the aortic arch. An artificial aortic valve is noted. Both lungs are clear. An intact left shoulder replacement is  seen. No acute osseous abnormalities are identified. IMPRESSION: No active cardiopulmonary disease. Electronically Signed   By: Aram Candela M.D.   On: 03/24/2023 23:46   CT Head Wo Contrast  Result Date: 03/07/2023 CLINICAL DATA:  Headache, new onset (Age >= 51y) EXAM: CT HEAD WITHOUT CONTRAST TECHNIQUE: Contiguous axial images were obtained from the base of the skull through the vertex without intravenous contrast. RADIATION DOSE REDUCTION: This exam was performed according to the departmental dose-optimization program which includes automated exposure control, adjustment of the mA and/or kV according to patient size and/or use of iterative reconstruction technique. COMPARISON:  CT head October 16, 2022. FINDINGS: Brain: No evidence of acute infarction, hemorrhage, hydrocephalus, extra-axial collection or mass lesion/mass effect. Remote left cerebellar infarct. Vascular: No hyperdense vessel. Skull: No acute fracture. Sinuses/Orbits: Clear sinuses.  No acute orbital findings. Other: No mastoid effusions. IMPRESSION: No evidence of acute intracranial abnormality. Electronically Signed   By: Feliberto Harts M.D.   On: 03/07/2023 08:38    Microbiology: No results found for this or any previous visit (from the past 240 hour(s)).   Labs: Basic Metabolic Panel: Recent Labs  Lab 03/24/23 2258 03/25/23 0550  NA 139  --   K 3.9  --  CL 107  --   CO2 21*  --   GLUCOSE 133*  --   BUN 15  --   CREATININE 0.89 0.94  CALCIUM 10.3  --   MG 1.7  --    Liver Function Tests: No results for input(s): "AST", "ALT", "ALKPHOS", "BILITOT", "PROT", "ALBUMIN" in the last 168 hours. No results for input(s): "LIPASE", "AMYLASE" in the last 168 hours. No results for input(s): "AMMONIA" in the last 168 hours. CBC: Recent Labs  Lab 03/24/23 2258 03/25/23 0550  WBC 6.4 6.2  HGB 10.5* 9.6*  HCT 33.7* 31.6*  MCV 60.6* 61.2*  PLT 197 180   Cardiac Enzymes: No results for input(s): "CKTOTAL", "CKMB",  "CKMBINDEX", "TROPONINI" in the last 168 hours. BNP: BNP (last 3 results) Recent Labs    10/16/22 1313  BNP 182.5*    ProBNP (last 3 results) Recent Labs    06/26/22 1457 07/03/22 1411 08/31/22 1103  PROBNP 924* 503 511    CBG: No results for input(s): "GLUCAP" in the last 168 hours.     Signed:  Silvano Bilis MD.  [facility] 03/25/2023, 8:38 AM

## 2023-03-26 ENCOUNTER — Telehealth: Payer: Self-pay | Admitting: Gastroenterology

## 2023-03-26 ENCOUNTER — Telehealth: Payer: Self-pay

## 2023-03-26 LAB — URINE CULTURE: Special Requests: NORMAL

## 2023-03-26 NOTE — Transitions of Care (Post Inpatient/ED Visit) (Signed)
03/26/2023  Name: Breanna Webster MRN: [id number] DOB: 04-11-1946  Today's TOC FU Call Status: Today's TOC FU Call Status:: Successful TOC FU Call Completed TOC FU Call Complete Date: 03/26/23 Patient's Name and Date of Birth confirmed.  Transition Care Management Follow-up Telephone Call Date of Discharge: 03/25/23 Discharge Facility[facility]) Type of Discharge: Emergency Department Reason for ED Visit: Other: (injury) How have you been since you were released from the hospital?: Same Any questions or concerns?: No  Items Reviewed: Did you receive and understand the discharge instructions provided?: Yes Medications obtained,verified, and reconciled?: Yes (Medications Reviewed) Any new allergies since your discharge?: No Dietary orders reviewed?: Yes Do you have support at home?: Yes People in Home: spouse  Medications Reviewed Today: Medications Reviewed Today     Reviewed by Karena Addison, LPN (Licensed Practical Nurse) on 03/26/23 at 1446  Med List Status: <None>   Medication Order Taking? Sig Documenting Provider Last Dose Status Informant  acetaminophen (TYLENOL) 500 MG tablet [id number] No Take 1,000 mg by mouth every 6 (six) hours as needed for moderate pain (pain score 4-6). [provider] 03/24/2023 Active Spouse/Significant Other, Pharmacy Records  alendronate (FOSAMAX) 70 MG tablet [id number] No Take 1 tablet (70 mg total) by mouth once a week.  Patient not taking: Reported on 03/25/2023    Not Taking Active Spouse/Significant Other, Pharmacy Records  allopurinol (ZYLOPRIM) 100 MG tablet [id number] No Take 1 tablet (100 mg total) by mouth daily.  03/24/2023 Active Spouse/Significant Other, Pharmacy Records  ALPRAZolam Prudy Feeler) 0.5 MG tablet [id number] No Take one tablet 30-60 minutes before MRI. Can take a second tablet if needed right before MRI  Patient not taking: Reported on 03/07/2023   Shirline Frees, NP Not Taking Active Spouse/Significant Other,  Pharmacy Records  amitriptyline (ELAVIL) 25 MG tablet [id number] No Take 1 tablet (25 mg total) by mouth at bedtime. Nafziger, Kandee Keen, NP Past Week Active Spouse/Significant Other, Pharmacy Records  apixaban (ELIQUIS) 5 MG TABS tablet [id number] No Take 1 tablet (5 mg total) by mouth 2 (two) times daily. Shirline Frees, NP 03/24/2023 0830 Active Spouse/Significant Other, Pharmacy Records  b complex vitamins capsule [id number] No Take 1 capsule by mouth daily. [provider] 03/24/2023 Active Spouse/Significant Other, Pharmacy Records  Cholecalciferol (D3 PO) [id number] No Take 1 tablet by mouth daily. [provider] 03/24/2023 Active Spouse/Significant Other, Pharmacy Records  colchicine 0.6 MG tablet [id number] No Take 1 tablet (0.6 mg total) by mouth daily. as needed for gout  Past Month Active Spouse/Significant Other, Pharmacy Records  diltiazem (CARTIA XT) 120 MG 24 hr capsule [id number] No Take 1 capsule (120 mg total) by mouth daily. Ronney Asters, NP 03/24/2023 Active Spouse/Significant Other, Pharmacy Records  Insulin NPH, Human,, Isophane, (HUMULIN N KWIKPEN) 100 UNIT/ML Ulice Brilliant [id number] No Inject 20 units into the skin every morning and 14 units every evening.  03/24/2023 Active Spouse/Significant Other, Pharmacy Records  insulin regular (HUMULIN R) 100 units/mL injection [id number] No Inject 0.1 mLs (10 Units total) into the skin 3 (three) times daily before meals  03/24/2023 Active Spouse/Significant Other, Pharmacy Records  Insulin Syringe-Needle U-100 (ULTICARE INSULIN SYRINGE) 31G X 5/16" 1 ML MISC [id number] No Inject 4 times daily subcutaneously Nafziger, Kandee Keen, NP Taking Active Spouse/Significant Other, Pharmacy Records  metFORMIN (GLUCOPHAGE-XR) 500 MG 24 hr tablet [id number] No Take 1 tablet by mouth 2 times daily  03/24/2023 Active Spouse/Significant Other, Pharmacy Records  Multiple Vitamin (MULITIVITAMIN WITH MINERALS) TABS [id number] No Take 1 tablet by mouth daily. [provider] 03/24/2023 Active Spouse/Significant Other, Pharmacy Records  nitroGLYCERIN (NITROSTAT) 0.4 MG SL tablet [id number] No Place 1 tablet (0.4 mg total) under the tongue every 5 (five) minutes as needed for chest pain. Lyn Records, MD Unk Active Spouse/Significant Other, Pharmacy Records           Med Note (COX, CANDICE A   Tue Jul 10, 2022  2:48 PM)    omeprazole (PRILOSEC) 20 MG capsule [id number] No Take 1 capsule (20 mg total) by mouth daily. Benancio Deeds, MD 03/24/2023 Active Spouse/Significant Other, Pharmacy Records  polyethylene glycol (MIRALAX) 17 g packet [id number] No Take 17 g by mouth daily as needed. [name], Willaim Rayas, MD Unk Active Spouse/Significant Other, Pharmacy Records  potassium chloride SA (KLOR-CON M) 20 MEQ tablet [id number] No Take 1 tablet (20 mEq total) by mouth daily. Take in concurrence with torsemide. Shirline Frees, NP 03/24/2023 Active Spouse/Significant Other, Pharmacy Records  simvastatin (ZOCOR) 10 MG tablet [id number] No Take 2 tablets (20 mg total) by mouth daily.  Patient not taking: Reported on 03/25/2023   Micki Riley, MD Not Taking Active Spouse/Significant Other, Pharmacy Records  SYNTHROID 137 MCG tablet [id number] No Take 1 tablet (137 mcg total) by mouth every morning on empty stomach.  03/24/2023 Active Spouse/Significant Other, Pharmacy Records  tiZANidine (ZANAFLEX) 4 MG tablet [id number] No Take 1 tablet (4 mg total) by mouth 3 (three) times daily.  Patient taking differently: Take 4 mg by mouth daily as needed for muscle spasms.    03/24/2023 Active Spouse/Significant Other, Pharmacy Records  torsemide (DEMADEX) 20 MG tablet [id number] No Take 2 tablets (40 mg total) by mouth daily.  Patient taking differently: Take 20 mg by mouth daily.   Ronney Asters, NP 03/24/2023 Active Spouse/Significant Other, Pharmacy Records  Vibegron [facility]) 75 MG TABS [id number] No Take 75 mg by mouth at bedtime. [provider] Past Week Active  Spouse/Significant Other, Pharmacy Records  vitamin E 180 MG (400 UNITS) capsule [id number] No Take 400 Units by mouth daily. [provider] 03/24/2023 Active Spouse/Significant Other, Pharmacy Records            Home Care and Equipment/Supplies: Were Home Health Services Ordered?: NA Any new equipment or medical supplies ordered?: NA  Functional Questionnaire: Do you need assistance with bathing/showering or dressing?: No Do you need assistance with meal preparation?: No Do you need assistance with eating?: No Do you have difficulty maintaining continence: No Do you need assistance with getting out of bed/getting out of a chair/moving?: No Do you have difficulty managing or taking your medications?: No  Follow up appointments reviewed: PCP Follow-up appointment confirmed?: Yes Date of PCP follow-up appointment?: 03/25/23 Follow-up Provider: [facility] Follow-up appointment confirmed?: NA Do you need transportation to your follow-up appointment?: No Do you understand care options if your condition(s) worsen?: Yes-patient verbalized understanding    SIGNATURE Karena Addison, LPN [facility] Nurse Health Advisor Direct Dial [phone]

## 2023-03-26 NOTE — Telephone Encounter (Signed)
Inbound call from patient's husband stating patient was recently admitted to the ED for gas and nausea. Requesting a urgent call back to discuss if patient is able to be seen sooner. Please advise, thank you.

## 2023-03-27 ENCOUNTER — Ambulatory Visit: Payer: Medicare Other | Admitting: Adult Health

## 2023-03-27 ENCOUNTER — Encounter: Payer: Self-pay | Admitting: Adult Health

## 2023-03-27 VITALS — BP 122/80 | HR 87 | Temp 98.1°F

## 2023-03-27 DIAGNOSIS — R519 Headache, unspecified: Secondary | ICD-10-CM | POA: Diagnosis not present

## 2023-03-27 DIAGNOSIS — G8929 Other chronic pain: Secondary | ICD-10-CM | POA: Diagnosis not present

## 2023-03-27 DIAGNOSIS — K5900 Constipation, unspecified: Secondary | ICD-10-CM

## 2023-03-27 DIAGNOSIS — R110 Nausea: Secondary | ICD-10-CM | POA: Diagnosis not present

## 2023-03-27 NOTE — Telephone Encounter (Signed)
Attempted to reach patient. Phone line busy. Will attempt to reach again at a later time.

## 2023-03-27 NOTE — Telephone Encounter (Signed)
I have spoken to the patient who describes 1 week of worsening intermittent nausea, "indigestion" and belching. Patient denies any vomiting, abdominal pain, fever. Does state that she has recently been experiencing increasing headaches which seem to coincide with the nausea.   Upon chart review, patient recently presented to ED (03/24/23) with multiple complaints including chronic lower back pain, inability to urinate, dizziness, nausea. Workup revealed no acute problems at that time. Patient does have follow up with her PCP and specialist physicians for her complaints. Does have GI history of liver cirrhosis, GERD and constipation.  I offered the patient an appointment to see Mike Gip, PA-C 04/04/23. Patient asks to be seen sooner as she "doesn't know if she can wait that long like this." I advised that unfortunately, this was the soonest availability at the office. Explained if her symptoms warranted a sooner appointment than the one available next week, she would need to seek more emergent evaluation again at the emergency room. Patient states "no need for me to be seen there. They will just tell me to see my doctor." Patient has agreed to 04/04/23 appointment at 2 pm with Amy.

## 2023-03-27 NOTE — Progress Notes (Signed)
Subjective:    Patient ID: Breanna Webster, female    DOB: 1945/10/07, 77 y.o.   MRN: [id number]  HPI 77 year old female who  has a past medical history of Anxiety, Arthritis, Atrial fibrillation with RVR (HCC) (10/21/2018), Back pain, Bacteremia due to Streptococcus Salivarius (10/07/2018), Blood transfusion without reported diagnosis, CAD (coronary artery disease), Carotid artery stenosis, Cerebellar stroke, acute (HCC) (10/21/2018), Cerebrovascular accident (CVA) due to embolism of right posterior cerebral artery (HCC) (08/03/2020), Chest pain, CHF (congestive heart failure) (HCC), Chronic lower back pain, Cirrhosis (HCC), Colon polyps, Diverticulitis, Diverticulosis, Esophageal thickening, GERD (gastroesophageal reflux disease), Gout, Grave's disease, H/O total shoulder replacement, left (06/01/2022), Heart murmur, History of cardioembolic stroke (10/21/1599), History of colonic polyps (05/22/2017), History of hiatal hernia, Hyperlipidemia, Hypertension, Hypothyroidism, IBS (irritable bowel syndrome), Osteopenia, Parkinson's syndrome (HCC) (04/23/2020), Pulmonary nodules, S/P TAVR (transcatheter aortic valve replacement), Shortness of breath on exertion, Stroke (HCC) (2021), Thalassemia minor, Thyroid disease, and Type II diabetes mellitus (HCC).  He presents to the office today for follow-up after being seen in the emergency room 2 days ago.  Patient with chronic pain.  Pain in back, hips, knees.  Also with chronic headache.  She is followed by [facility] neurology.  She is also seen by her rheumatology for evaluation of rheumatoid arthritis in addition to severe arthritis.  She was recently given yearly infusion, unsure of what infusion to assist with pain.  Since the infusion her symptoms progressively worsen.  She was scheduled for an MRI of the brain for chronic migraine however she was not able to go through with it due to claustrophobia and having a panic attack.  She is in the process of  getting set up for an open MRI.  She also has a CT she will head and neck scheduled for December 13.  The ED her urine was negative for UTI, normal white count of 6.4, low hemoglobin 10.5, normal platelets.  Normal potassium and normal creatinine.  She had 2 elevated troponins at 28 and 26  CT of head and angio chest abdomen pelvis were negative.   Today in the office she does report that she continues to have a chronic headache, back in September 2024 I sent in low-dose amitriptyline for her to take for chronic headaches, she has not been taking this up until yesterday when her husband started it.  She does report relief in the headache but in the middle of the night the headache came back and woke her from sleep.  She also feels bloated and nauseous.  She is taking prescribed Zofran for nausea and this helps but the nausea comes back.  She does report that she is passing gas but has not had a bowel movement in the last 5 days.  Does not have any abdominal pain or cramping currently   Review of Systems See HPI  Past Medical History:  Diagnosis Date   Anxiety    Arthritis    "back" (04/22/2018)   Atrial fibrillation with RVR (HCC) 10/21/2018   Back pain    Bacteremia due to Streptococcus Salivarius 10/07/2018   Blood transfusion without reported diagnosis    CAD (coronary artery disease)    a. 03/2018 s/p PCI/DES to the RCA (3.0x15 Onyx DES).   Carotid artery stenosis    Mild   Cerebellar stroke, acute (HCC) 10/21/2018   Cerebrovascular accident (CVA) due to embolism of right posterior cerebral artery (HCC) 08/03/2020   Chest pain    CHF (congestive heart failure) (HCC)  Chronic lower back pain    Cirrhosis (HCC)    Colon polyps    Diverticulitis    Diverticulosis    Esophageal thickening    seen on pre TAVR CT scan, also questionable cirrhosis. MRI recommended. Will refer to GI   GERD (gastroesophageal reflux disease)    Gout    Grave's disease    H/O total shoulder  replacement, left 06/01/2022   Heart murmur    History of cardioembolic stroke 03/29/2020   History of colonic polyps 05/22/2017   History of hiatal hernia    Hyperlipidemia    Hypertension    Hypothyroidism    IBS (irritable bowel syndrome)    Osteopenia    Parkinson's syndrome (HCC) 04/23/2020   Pulmonary nodules    seen on pre TAVR CT. likley benign. no follow up recommended if pt low risk.   S/P TAVR (transcatheter aortic valve replacement)    Shortness of breath on exertion    Stroke (HCC) 2021   x2   Thalassemia minor    Thyroid disease    Type II diabetes mellitus (HCC)     Social History   Socioeconomic History   Marital status: Married    Spouse name: Not on file   Number of children: 2   Years of education: Not on file   Highest education level: Associate degree: academic program  Occupational History   Occupation: Health and safety inspector of the [employer]  Tobacco Use   Smoking status: Never    Passive exposure: Never   Smokeless tobacco: Never  Vaping Use   Vaping status: Never Used  Substance and Sexual Activity   Alcohol use: No    Alcohol/week: 0.0 standard drinks of alcohol   Drug use: Never   Sexual activity: Not Currently  Other Topics Concern   Not on file  Social History Narrative   Lives with husband in a one story home.     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   Education: college.   Social Determinants of Health   Financial Resource Strain: Patient Declined (12/09/2022)   Overall Financial Resource Strain (CARDIA)    Difficulty of Paying Living Expenses: Patient declined  Food Insecurity: No Food Insecurity (12/09/2022)   Hunger Vital Sign    Worried About Running Out of Food in the Last Year: Never true    Ran Out of Food in the Last Year: Never true  Transportation Needs: Unknown (12/09/2022)   PRAPARE - Transportation    Lack of Transportation (Medical): No     Lack of Transportation (Non-Medical): Patient declined  Physical Activity: Unknown (12/09/2022)   Exercise Vital Sign    Days of Exercise per Week: Patient declined    Minutes of Exercise per Session: 40 min  Stress: No Stress Concern Present (12/09/2022)   [employer] of Occupational Health - Occupational Stress Questionnaire    Feeling of Stress : Only a little  Social Connections: Unknown (12/09/2022)   Social Connection and Isolation Panel [NHANES]    Frequency of Communication with Friends and Family: More than three times a week    Frequency of Social Gatherings with Friends and Family: Patient declined    Attends Religious Services: Patient declined    Active Member of Clubs or Organizations: Patient declined    Attends Banker Meetings: More than 4 times per year    Marital Status: Married  Catering manager Violence: Not At Risk (10/16/2022)  Humiliation, Afraid, Rape, and Kick questionnaire    Fear of Current or Ex-Partner: No    Emotionally Abused: No    Physically Abused: No    Sexually Abused: No    Past Surgical History:  Procedure Laterality Date   35 HOUR PH STUDY N/A 03/03/2018   Procedure: 24 HOUR PH STUDY;  Surgeon: Napoleon Form, MD;  Location: [facility] ENDOSCOPY;  Service: Endoscopy;  Laterality: N/A;   COLONOSCOPY  2023   COLONOSCOPY W/ BIOPSIES AND POLYPECTOMY     CORONARY ANGIOGRAPHY Right 04/21/2018   Procedure: CORONARY ANGIOGRAPHY (CATH LAB);  Surgeon: Lyn Records, MD;  [facility] INVASIVE CV LAB;  Service: Cardiovascular;  Laterality: Right;   CORONARY STENT INTERVENTION N/A 04/22/2018   Procedure: CORONARY STENT INTERVENTION;  Surgeon: Lyn Records, MD;  Location: [facility] INVASIVE CV LAB;  Service: Cardiovascular;  Laterality: N/A;   DILATION AND CURETTAGE OF UTERUS     ESOPHAGEAL MANOMETRY N/A 03/03/2018   Procedure: ESOPHAGEAL MANOMETRY (EM);  Surgeon: Napoleon Form, MD;  Location: [facility] ENDOSCOPY;  Service: Endoscopy;   Laterality: N/A;   HYSTEROSCOPY     fibroids   LAPAROSCOPIC CHOLECYSTECTOMY  1985   LAPAROSCOPY     fibroids   NISSEN FUNDOPLICATION  1990s   POLYPECTOMY     REVERSE SHOULDER ARTHROPLASTY Left 06/01/2022   Procedure: REVERSE SHOULDER ARTHROPLASTY;  Surgeon: Beverely Low, MD;  Location: [facility] ORS;  Service: Orthopedics;  Laterality: Left;  choice with interscalene block   RIGHT/LEFT HEART CATH AND CORONARY ANGIOGRAPHY N/A 02/20/2018   Procedure: RIGHT/LEFT HEART CATH AND CORONARY ANGIOGRAPHY;  Surgeon: Lyn Records, MD;  Location: [facility] INVASIVE CV LAB;  Service: Cardiovascular;  Laterality: N/A;   TEE WITHOUT CARDIOVERSION N/A 07/08/2018   Procedure: TRANSESOPHAGEAL ECHOCARDIOGRAM (TEE);  Surgeon: Kathleene Hazel, MD;  Location: [facility] OR;  Service: Open Heart Surgery;  Laterality: N/A;   TEE WITHOUT CARDIOVERSION  10/07/2018   TEE WITHOUT CARDIOVERSION N/A 10/07/2018   Procedure: TRANSESOPHAGEAL ECHOCARDIOGRAM (TEE);  Surgeon: Jake Bathe, MD;  Location: [facility] ENDOSCOPY;  Service: Cardiovascular;  Laterality: N/A;   TONSILLECTOMY     age 31   TRANSCATHETER AORTIC VALVE REPLACEMENT, TRANSFEMORAL N/A 07/08/2018   Procedure: TRANSCATHETER AORTIC VALVE REPLACEMENT, TRANSFEMORAL;  Surgeon: Kathleene Hazel, MD;  Location: [facility] OR;  Service: Open Heart Surgery;  Laterality: N/A;    Family History  Adopted: Yes  Problem Relation Age of Onset   Healthy Son        x 2   Headache Other        Cluster headaches   Heart failure Mother    Colon cancer Neg Hx    Pancreatic cancer Neg Hx    Rectal cancer Neg Hx    Stomach cancer Neg Hx     No Known Allergies  Current Outpatient Medications on File Prior to Visit  Medication Sig Dispense Refill   acetaminophen (TYLENOL) 500 MG tablet Take 1,000 mg by mouth every 6 (six) hours as needed for moderate pain (pain score 4-6).     alendronate (FOSAMAX) 70 MG tablet Take 1 tablet (70 mg total) by mouth once a week. 12 tablet 3   allopurinol  (ZYLOPRIM) 100 MG tablet Take 1 tablet (100 mg total) by mouth daily. 90 tablet 1   ALPRAZolam (XANAX) 0.5 MG tablet Take one tablet 30-60 minutes before MRI. Can take a second tablet if needed right before MRI 2 tablet 0   amitriptyline (ELAVIL) 25 MG tablet Take 1 tablet (  25 mg total) by mouth at bedtime. 90 tablet 1   apixaban (ELIQUIS) 5 MG TABS tablet Take 1 tablet (5 mg total) by mouth 2 (two) times daily. 60 tablet 3   b complex vitamins capsule Take 1 capsule by mouth daily.     Cholecalciferol (D3 PO) Take 1 tablet by mouth daily.     colchicine 0.6 MG tablet Take 1 tablet (0.6 mg total) by mouth daily. as needed for gout 90 tablet 0   diltiazem (CARTIA XT) 120 MG 24 hr capsule Take 1 capsule (120 mg total) by mouth daily. 90 capsule 3   Insulin NPH, Human,, Isophane, (HUMULIN N KWIKPEN) 100 UNIT/ML Kiwkpen Inject 20 units into the skin every morning and 14 units every evening. 15 mL 6   insulin regular (HUMULIN R) 100 units/mL injection Inject 0.1 mLs (10 Units total) into the skin 3 (three) times daily before meals 10 mL 3   Insulin Syringe-Needle U-100 (ULTICARE INSULIN SYRINGE) 31G X 5/16" 1 ML MISC Inject 4 times daily subcutaneously 200 each 0   metFORMIN (GLUCOPHAGE-XR) 500 MG 24 hr tablet Take 1 tablet by mouth 2 times daily 180 tablet 2   Multiple Vitamin (MULITIVITAMIN WITH MINERALS) TABS Take 1 tablet by mouth daily.     nitroGLYCERIN (NITROSTAT) 0.4 MG SL tablet Place 1 tablet (0.4 mg total) under the tongue every 5 (five) minutes as needed for chest pain. 25 tablet 4   omeprazole (PRILOSEC) 20 MG capsule Take 1 capsule (20 mg total) by mouth daily. 90 capsule 1   polyethylene glycol (MIRALAX) 17 g packet Take 17 g by mouth daily as needed.     potassium chloride SA (KLOR-CON M) 20 MEQ tablet Take 1 tablet (20 mEq total) by mouth daily. Take in concurrence with torsemide. 40 tablet 0   simvastatin (ZOCOR) 10 MG tablet Take 2 tablets (20 mg total) by mouth daily. 30 tablet 3    SYNTHROID 137 MCG tablet Take 1 tablet (137 mcg total) by mouth every morning on empty stomach. 90 tablet 1   tiZANidine (ZANAFLEX) 4 MG tablet Take 1 tablet (4 mg total) by mouth 3 (three) times daily. (Patient taking differently: Take 4 mg by mouth daily as needed for muscle spasms.) 90 tablet 1   torsemide (DEMADEX) 20 MG tablet Take 2 tablets (40 mg total) by mouth daily. (Patient taking differently: Take 20 mg by mouth daily.) 60 tablet 6   Vibegron (GEMTESA) 75 MG TABS Take 75 mg by mouth at bedtime.     vitamin E 180 MG (400 UNITS) capsule Take 400 Units by mouth daily.     No current facility-administered medications on file prior to visit.    BP 122/80   Pulse 87   Temp 98.1 F (36.7 C) (Oral)       Objective:   Physical Exam Vitals and nursing note reviewed.  Cardiovascular:     Rate and Rhythm: Normal rate and regular rhythm.     Pulses: Normal pulses.     Heart sounds: Normal heart sounds.  Pulmonary:     Effort: Pulmonary effort is normal.     Breath sounds: Normal breath sounds.  Abdominal:     General: Bowel sounds are normal. There is distension.     Palpations: There is no mass.     Tenderness: There is no abdominal tenderness.  Musculoskeletal:        General: Normal range of motion.  Skin:    General: Skin is warm and  dry.  Neurological:     General: No focal deficit present.     Mental Status: She is oriented to person, place, and time.  Psychiatric:        Mood and Affect: Mood normal.        Behavior: Behavior normal.        Thought Content: Thought content normal.        Judgment: Judgment normal.       Assessment & Plan:  1. Chronic intractable headache, unspecified headache type -Encouraged to take amitriptyline 25 mg nightly.  Continue to follow-up with neurology and have imaging studies done. -Assurance was given on imaging studies done during ER visit  2. Nausea -Continue with Zofran as needed  3. Constipation, unspecified constipation  type -Courage to use a laxative at home to help with having a bowel movement.  Can also drink prune juice.  Stay hydrated and eat a high-fiber diet.  Shirline Frees, NP  Time spent with patient today was 42 minutes which consisted of chart review, discussing headaches, nausea, and constipation, work up, treatment answering questions and documentation.

## 2023-03-28 ENCOUNTER — Other Ambulatory Visit (HOSPITAL_BASED_OUTPATIENT_CLINIC_OR_DEPARTMENT_OTHER): Payer: Self-pay

## 2023-04-02 DIAGNOSIS — R0609 Other forms of dyspnea: Secondary | ICD-10-CM

## 2023-04-02 DIAGNOSIS — R600 Localized edema: Secondary | ICD-10-CM

## 2023-04-03 ENCOUNTER — Telehealth: Payer: Self-pay

## 2023-04-03 DIAGNOSIS — K7460 Unspecified cirrhosis of liver: Secondary | ICD-10-CM

## 2023-04-03 NOTE — Telephone Encounter (Signed)
Order placed for RUQ U/S.  MyChart message to patient.  Message to schedulers to call patient.  Patient also due for CBC, CMET, AFP - orders entered MyChart message to patient

## 2023-04-03 NOTE — Telephone Encounter (Signed)
Spoke to patient . Patient is audible short of breath while talking. Patient  states she has not  had any weight gain , but she has not weighed herself .  She has swelling legs and feet but wear support hose. - nothing new per patient She states abdomen is always full .  Patient states she went to Er last week for same issue. This has been going on for several weeks per patient.   Informed patient  of [facility] instructions take doubling   torsemide and potassium for 3 days  then repeat las - BMP  Patient states she cannot do that because she has several appointment and testing  the next 2 days.  She states she has an appointment with Dr Sherene Sires In Jan 2025, several appointments GI , Neurology    RN informed patient if she unable  to do medication increase Thursday or Friday try doing in Saturday ,Sunday  contact office  on Monday   RN also informed if symptoms become worse to nearest ER  which would be ER at [facility].  Patient voice understanding ans states she will call On Monday    BMP order placed  appt will need to allocated for patient

## 2023-04-03 NOTE — Telephone Encounter (Signed)
-----   Message from [facility] Marylu Lund H sent at 10/29/2022  2:10 PM EDT ----- Regarding: ruq u/s in Jan Patient due for RUQ u/S for Cirrhosis in early january

## 2023-04-04 ENCOUNTER — Encounter (HOSPITAL_COMMUNITY): Payer: Self-pay | Admitting: Emergency Medicine

## 2023-04-04 ENCOUNTER — Other Ambulatory Visit: Payer: Self-pay

## 2023-04-04 ENCOUNTER — Emergency Department (HOSPITAL_COMMUNITY): Payer: Medicare Other

## 2023-04-04 ENCOUNTER — Inpatient Hospital Stay (HOSPITAL_COMMUNITY)
Admission: EM | Admit: 2023-04-04 | Discharge: 2023-04-09 | DRG: 291 | Disposition: A | Discharge status: Home Health | Payer: Medicare Other | Attending: Internal Medicine | Admitting: Internal Medicine

## 2023-04-04 ENCOUNTER — Other Ambulatory Visit: Payer: Medicare Other

## 2023-04-04 ENCOUNTER — Other Ambulatory Visit (HOSPITAL_BASED_OUTPATIENT_CLINIC_OR_DEPARTMENT_OTHER): Payer: Self-pay

## 2023-04-04 ENCOUNTER — Ambulatory Visit: Payer: Medicare Other | Admitting: Physician Assistant

## 2023-04-04 ENCOUNTER — Other Ambulatory Visit (INDEPENDENT_AMBULATORY_CARE_PROVIDER_SITE_OTHER): Payer: Self-pay

## 2023-04-04 ENCOUNTER — Encounter: Payer: Self-pay | Admitting: Physician Assistant

## 2023-04-04 VITALS — BP 110/64 | HR 88

## 2023-04-04 DIAGNOSIS — E1169 Type 2 diabetes mellitus with other specified complication: Secondary | ICD-10-CM

## 2023-04-04 DIAGNOSIS — I2489 Other forms of acute ischemic heart disease: Secondary | ICD-10-CM | POA: Diagnosis present

## 2023-04-04 DIAGNOSIS — Z0289 Encounter for other administrative examinations: Secondary | ICD-10-CM

## 2023-04-04 DIAGNOSIS — M545 Low back pain, unspecified: Secondary | ICD-10-CM | POA: Diagnosis not present

## 2023-04-04 DIAGNOSIS — N179 Acute kidney failure, unspecified: Secondary | ICD-10-CM

## 2023-04-04 DIAGNOSIS — E663 Overweight: Secondary | ICD-10-CM | POA: Diagnosis present

## 2023-04-04 DIAGNOSIS — E781 Pure hyperglyceridemia: Secondary | ICD-10-CM | POA: Diagnosis present

## 2023-04-04 DIAGNOSIS — J90 Pleural effusion, not elsewhere classified: Secondary | ICD-10-CM | POA: Diagnosis not present

## 2023-04-04 DIAGNOSIS — G20A1 Parkinson's disease without dyskinesia, without mention of fluctuations: Secondary | ICD-10-CM | POA: Diagnosis not present

## 2023-04-04 DIAGNOSIS — M47816 Spondylosis without myelopathy or radiculopathy, lumbar region: Secondary | ICD-10-CM | POA: Diagnosis present

## 2023-04-04 DIAGNOSIS — E038 Other specified hypothyroidism: Secondary | ICD-10-CM | POA: Diagnosis not present

## 2023-04-04 DIAGNOSIS — R0602 Shortness of breath: Principal | ICD-10-CM

## 2023-04-04 DIAGNOSIS — Z952 Presence of prosthetic heart valve: Secondary | ICD-10-CM

## 2023-04-04 DIAGNOSIS — G8929 Other chronic pain: Secondary | ICD-10-CM | POA: Diagnosis present

## 2023-04-04 DIAGNOSIS — K5900 Constipation, unspecified: Secondary | ICD-10-CM | POA: Diagnosis not present

## 2023-04-04 DIAGNOSIS — K7460 Unspecified cirrhosis of liver: Secondary | ICD-10-CM

## 2023-04-04 DIAGNOSIS — F419 Anxiety disorder, unspecified: Secondary | ICD-10-CM | POA: Diagnosis present

## 2023-04-04 DIAGNOSIS — R110 Nausea: Secondary | ICD-10-CM

## 2023-04-04 DIAGNOSIS — I447 Left bundle-branch block, unspecified: Secondary | ICD-10-CM | POA: Diagnosis present

## 2023-04-04 DIAGNOSIS — R0902 Hypoxemia: Secondary | ICD-10-CM | POA: Diagnosis not present

## 2023-04-04 DIAGNOSIS — D509 Iron deficiency anemia, unspecified: Secondary | ICD-10-CM | POA: Diagnosis present

## 2023-04-04 DIAGNOSIS — E871 Hypo-osmolality and hyponatremia: Secondary | ICD-10-CM | POA: Diagnosis present

## 2023-04-04 DIAGNOSIS — Z79899 Other long term (current) drug therapy: Secondary | ICD-10-CM

## 2023-04-04 DIAGNOSIS — I700 Atherosclerosis of aorta: Secondary | ICD-10-CM | POA: Diagnosis not present

## 2023-04-04 DIAGNOSIS — M546 Pain in thoracic spine: Secondary | ICD-10-CM | POA: Diagnosis not present

## 2023-04-04 DIAGNOSIS — R54 Age-related physical debility: Secondary | ICD-10-CM | POA: Diagnosis present

## 2023-04-04 DIAGNOSIS — K219 Gastro-esophageal reflux disease without esophagitis: Secondary | ICD-10-CM

## 2023-04-04 DIAGNOSIS — Z79890 Hormone replacement therapy (postmenopausal): Secondary | ICD-10-CM

## 2023-04-04 DIAGNOSIS — Z955 Presence of coronary angioplasty implant and graft: Secondary | ICD-10-CM

## 2023-04-04 DIAGNOSIS — G20C Parkinsonism, unspecified: Secondary | ICD-10-CM | POA: Diagnosis present

## 2023-04-04 DIAGNOSIS — I5031 Acute diastolic (congestive) heart failure: Secondary | ICD-10-CM | POA: Diagnosis not present

## 2023-04-04 DIAGNOSIS — R29818 Other symptoms and signs involving the nervous system: Secondary | ICD-10-CM | POA: Diagnosis not present

## 2023-04-04 DIAGNOSIS — I2510 Atherosclerotic heart disease of native coronary artery without angina pectoris: Secondary | ICD-10-CM | POA: Diagnosis not present

## 2023-04-04 DIAGNOSIS — M2540 Effusion, unspecified joint: Secondary | ICD-10-CM | POA: Diagnosis not present

## 2023-04-04 DIAGNOSIS — Z7901 Long term (current) use of anticoagulants: Secondary | ICD-10-CM

## 2023-04-04 DIAGNOSIS — Z794 Long term (current) use of insulin: Secondary | ICD-10-CM

## 2023-04-04 DIAGNOSIS — Z8249 Family history of ischemic heart disease and other diseases of the circulatory system: Secondary | ICD-10-CM

## 2023-04-04 DIAGNOSIS — R918 Other nonspecific abnormal finding of lung field: Secondary | ICD-10-CM | POA: Diagnosis not present

## 2023-04-04 DIAGNOSIS — Z96612 Presence of left artificial shoulder joint: Secondary | ICD-10-CM | POA: Diagnosis present

## 2023-04-04 DIAGNOSIS — I480 Paroxysmal atrial fibrillation: Secondary | ICD-10-CM | POA: Diagnosis present

## 2023-04-04 DIAGNOSIS — E039 Hypothyroidism, unspecified: Secondary | ICD-10-CM | POA: Diagnosis not present

## 2023-04-04 DIAGNOSIS — Z9049 Acquired absence of other specified parts of digestive tract: Secondary | ICD-10-CM

## 2023-04-04 DIAGNOSIS — Z8673 Personal history of transient ischemic attack (TIA), and cerebral infarction without residual deficits: Secondary | ICD-10-CM

## 2023-04-04 DIAGNOSIS — I509 Heart failure, unspecified: Secondary | ICD-10-CM | POA: Diagnosis not present

## 2023-04-04 DIAGNOSIS — M549 Dorsalgia, unspecified: Secondary | ICD-10-CM | POA: Diagnosis not present

## 2023-04-04 DIAGNOSIS — F32A Depression, unspecified: Secondary | ICD-10-CM | POA: Diagnosis not present

## 2023-04-04 DIAGNOSIS — D563 Thalassemia minor: Secondary | ICD-10-CM | POA: Diagnosis present

## 2023-04-04 DIAGNOSIS — I5033 Acute on chronic diastolic (congestive) heart failure: Secondary | ICD-10-CM

## 2023-04-04 DIAGNOSIS — R079 Chest pain, unspecified: Secondary | ICD-10-CM | POA: Diagnosis not present

## 2023-04-04 DIAGNOSIS — Z860100 Personal history of colon polyps, unspecified: Secondary | ICD-10-CM

## 2023-04-04 DIAGNOSIS — I10 Essential (primary) hypertension: Secondary | ICD-10-CM | POA: Diagnosis present

## 2023-04-04 DIAGNOSIS — Z6829 Body mass index (BMI) 29.0-29.9, adult: Secondary | ICD-10-CM

## 2023-04-04 DIAGNOSIS — E876 Hypokalemia: Secondary | ICD-10-CM | POA: Diagnosis present

## 2023-04-04 DIAGNOSIS — K7589 Other specified inflammatory liver diseases: Secondary | ICD-10-CM | POA: Diagnosis not present

## 2023-04-04 DIAGNOSIS — I272 Pulmonary hypertension, unspecified: Secondary | ICD-10-CM | POA: Diagnosis not present

## 2023-04-04 DIAGNOSIS — I110 Hypertensive heart disease with heart failure: Secondary | ICD-10-CM | POA: Diagnosis not present

## 2023-04-04 DIAGNOSIS — R633 Feeding difficulties, unspecified: Secondary | ICD-10-CM | POA: Diagnosis present

## 2023-04-04 DIAGNOSIS — I5032 Chronic diastolic (congestive) heart failure: Secondary | ICD-10-CM

## 2023-04-04 DIAGNOSIS — E1165 Type 2 diabetes mellitus with hyperglycemia: Secondary | ICD-10-CM | POA: Diagnosis not present

## 2023-04-04 DIAGNOSIS — G4733 Obstructive sleep apnea (adult) (pediatric): Secondary | ICD-10-CM | POA: Diagnosis present

## 2023-04-04 DIAGNOSIS — R0603 Acute respiratory distress: Secondary | ICD-10-CM | POA: Diagnosis present

## 2023-04-04 DIAGNOSIS — E118 Type 2 diabetes mellitus with unspecified complications: Secondary | ICD-10-CM

## 2023-04-04 DIAGNOSIS — M7989 Other specified soft tissue disorders: Secondary | ICD-10-CM

## 2023-04-04 DIAGNOSIS — M47814 Spondylosis without myelopathy or radiculopathy, thoracic region: Secondary | ICD-10-CM | POA: Diagnosis not present

## 2023-04-04 DIAGNOSIS — Z7984 Long term (current) use of oral hypoglycemic drugs: Secondary | ICD-10-CM

## 2023-04-04 LAB — CBC WITH DIFFERENTIAL/PLATELET
Abs Immature Granulocytes: 0.02 10*3/uL (ref 0.00–0.07)
Basophils Absolute: 0.1 10*3/uL (ref 0.0–0.1)
Basophils Absolute: 0.1 10*3/uL (ref 0.0–0.1)
Basophils Relative: 2.1 % (ref 0.0–3.0)
Basophils Relative: 1 %
Eosinophils Absolute: 0.1 10*3/uL (ref 0.0–0.7)
Eosinophils Absolute: 0.1 10*3/uL (ref 0.0–0.5)
Eosinophils Relative: 1.3 % (ref 0.0–5.0)
Eosinophils Relative: 2 %
HCT: 31.2 % — ABNORMAL LOW (ref 36.0–46.0)
HCT: 32 % — ABNORMAL LOW (ref 36.0–46.0)
Hemoglobin: 9.9 g/dL — ABNORMAL LOW (ref 12.0–15.0)
Hemoglobin: 9.7 g/dL — ABNORMAL LOW (ref 12.0–15.0)
Immature Granulocytes: 0 %
Lymphocytes Relative: 23.4 % (ref 12.0–46.0)
Lymphocytes Relative: 25 %
Lymphs Abs: 1.3 10*3/uL (ref 0.7–4.0)
Lymphs Abs: 1.4 10*3/uL (ref 0.7–4.0)
MCH: 19 pg — ABNORMAL LOW (ref 26.0–34.0)
MCHC: 31.8 g/dL (ref 30.0–36.0)
MCHC: 30.3 g/dL (ref 30.0–36.0)
MCV: 61.5 fL — ABNORMAL LOW (ref 78.0–100.0)
MCV: 62.6 fL — ABNORMAL LOW (ref 80.0–100.0)
Monocytes Absolute: 0.4 10*3/uL (ref 0.1–1.0)
Monocytes Absolute: 0.5 10*3/uL (ref 0.1–1.0)
Monocytes Relative: 6.6 % (ref 3.0–12.0)
Monocytes Relative: 8 %
Neutro Abs: 3.6 10*3/uL (ref 1.4–7.7)
Neutro Abs: 3.5 10*3/uL (ref 1.7–7.7)
Neutrophils Relative %: 66.6 % (ref 43.0–77.0)
Neutrophils Relative %: 64 %
Platelets: 175 10*3/uL (ref 150.0–400.0)
Platelets: 150 10*3/uL (ref 150–400)
RBC: 5.07 Mil/uL (ref 3.87–5.11)
RBC: 5.11 MIL/uL (ref 3.87–5.11)
RDW: 16.9 % — ABNORMAL HIGH (ref 11.5–15.5)
RDW: 16.8 % — ABNORMAL HIGH (ref 11.5–15.5)
WBC: 5.4 10*3/uL (ref 4.0–10.5)
WBC: 5.6 10*3/uL (ref 4.0–10.5)
nRBC: 0 % (ref 0.0–0.2)

## 2023-04-04 LAB — COMPREHENSIVE METABOLIC PANEL
ALT: 19 U/L (ref 0–35)
ALT: 22 U/L (ref 0–44)
AST: 23 U/L (ref 0–37)
AST: 29 U/L (ref 15–41)
Albumin: 4.1 g/dL (ref 3.5–5.2)
Albumin: 3.7 g/dL (ref 3.5–5.0)
Alkaline Phosphatase: 80 U/L (ref 39–117)
Alkaline Phosphatase: 72 U/L (ref 38–126)
Anion gap: 12 (ref 5–15)
BUN: 16 mg/dL (ref 6–23)
BUN: 15 mg/dL (ref 8–23)
CO2: 22 meq/L (ref 19–32)
CO2: 17 mmol/L — ABNORMAL LOW (ref 22–32)
Calcium: 10.1 mg/dL (ref 8.4–10.5)
Calcium: 10.1 mg/dL (ref 8.9–10.3)
Chloride: 111 meq/L (ref 96–112)
Chloride: 113 mmol/L — ABNORMAL HIGH (ref 98–111)
Creatinine, Ser: 0.69 mg/dL (ref 0.40–1.20)
Creatinine, Ser: 0.68 mg/dL (ref 0.44–1.00)
GFR, Estimated: >60 mL/min (ref >60)
GFR: 82.56 mL/min (ref >60.00)
Glucose, Bld: 113 mg/dL — ABNORMAL HIGH (ref 70–99)
Glucose, Bld: 123 mg/dL — ABNORMAL HIGH (ref 70–99)
Potassium: 3.9 meq/L (ref 3.5–5.1)
Potassium: 3.7 mmol/L (ref 3.5–5.1)
Sodium: 143 meq/L (ref 135–145)
Sodium: 142 mmol/L (ref 135–145)
Total Bilirubin: 0.7 mg/dL (ref 0.2–1.2)
Total Bilirubin: 0.8 mg/dL (ref <1.2)
Total Protein: 7 g/dL (ref 6.0–8.3)
Total Protein: 6.6 g/dL (ref 6.5–8.1)

## 2023-04-04 LAB — BRAIN NATRIURETIC PEPTIDE: B Natriuretic Peptide: 401.3 pg/mL — ABNORMAL HIGH (ref 0.0–100.0)

## 2023-04-04 LAB — PROTIME-INR
INR: 1.6 {ratio} — ABNORMAL HIGH (ref 0.8–1.0)
Prothrombin Time: 16.5 s — ABNORMAL HIGH (ref 9.6–13.1)

## 2023-04-04 LAB — LIPASE, BLOOD: Lipase: 19 U/L (ref 11–51)

## 2023-04-04 MED ORDER — ONDANSETRON HCL 4 MG/2ML IJ SOLN
4.0000 mg | Freq: Once | INTRAMUSCULAR | Status: AC
  Administered 2023-04-04: 4 mg via INTRAVENOUS
  Filled 2023-04-04: qty 2

## 2023-04-04 MED ORDER — MORPHINE SULFATE (PF) 4 MG/ML IV SOLN
4.0000 mg | Freq: Once | INTRAVENOUS | Status: AC
Start: 2023-04-04 — End: 2023-04-04
  Administered 2023-04-04: 4 mg via INTRAVENOUS
  Filled 2023-04-04: qty 1

## 2023-04-04 MED ORDER — HYDROMORPHONE HCL 1 MG/ML IJ SOLN
0.5000 mg | Freq: Once | INTRAMUSCULAR | Status: AC
  Administered 2023-04-04: 0.5 mg via INTRAVENOUS
  Filled 2023-04-04: qty 1

## 2023-04-04 MED ORDER — FUROSEMIDE 10 MG/ML IJ SOLN
40.0000 mg | Freq: Once | INTRAMUSCULAR | Status: AC
  Administered 2023-04-04: 40 mg via INTRAVENOUS
  Filled 2023-04-04: qty 4

## 2023-04-04 MED ORDER — PREDNISONE 20 MG PO TABS
40.0000 mg | ORAL_TABLET | Freq: Every day | ORAL | 0 refills | Status: DC
  Filled 2023-04-04: qty 8, 4d supply, fill #0

## 2023-04-04 MED ORDER — ONDANSETRON HCL 4 MG PO TABS
4.0000 mg | ORAL_TABLET | Freq: Three times a day (TID) | ORAL | 1 refills | Status: DC | PRN
  Filled 2023-04-04: qty 40, 14d supply, fill #0

## 2023-04-04 MED ORDER — HYDROCODONE-ACETAMINOPHEN 5-325 MG PO TABS
1.0000 | ORAL_TABLET | Freq: Four times a day (QID) | ORAL | 0 refills | Status: DC | PRN
  Filled 2023-04-04 – 2023-04-05 (×2): qty 10, 3d supply, fill #0

## 2023-04-04 MED ORDER — OMEPRAZOLE 20 MG PO CPDR
20.0000 mg | DELAYED_RELEASE_CAPSULE | Freq: Every day | ORAL | 3 refills | Status: DC
  Filled 2023-04-04: qty 90, 90d supply, fill #0

## 2023-04-04 MED ORDER — DEXAMETHASONE SODIUM PHOSPHATE 10 MG/ML IJ SOLN
10.0000 mg | Freq: Once | INTRAMUSCULAR | Status: AC
  Administered 2023-04-04: 10 mg via INTRAVENOUS
  Filled 2023-04-04: qty 1

## 2023-04-04 NOTE — Telephone Encounter (Signed)
Hi team,   Thanks for the update! Covering for [employer]. I think the best think would to get her in next available with Dr. Jacques Navy or APP for evaluation.  Alver Sorrow, NP

## 2023-04-04 NOTE — ED Provider Notes (Signed)
[facility] [facility]ERGENC[facility]PARTMENT AT [facility] Provider Note   CSN: [id number] Arrival date & time: 04/04/23  2024     History  Chief Complaint  Patient presents with   Shortness of Breath    Breanna Webster is a 77 y.o. female.  HPI Patient with multiple medical problems including intractable headache, A-fib on Eliquis, cardiac disease, insulin-dependent diabetes, now presents with concern for dyspnea, acute on chronic pain.  She notes that she has pain in her head, back, chronically, these are worse, and she has new difficulty breathing possible onset today.  Exact onset is unclear, but it seems though she has been uncomfortable for some time, has been seen, evaluated here and by her primary care physician.  She is also scheduled for neurology follow-up imaging tomorrow.  No new weakness in any extremity, no new speech complaints, no new vision complaints. No obvious precipitant, but she states that with worsening difficulty breathing, and worsening of her pain she presents for evaluation.    Home Medications Prior to Admission medications   Medication Sig Start Date End Date Taking? Authorizing Provider  HYDROcodone-acetaminophen (NORCO/VICODIN) 5-325 MG tablet Take 1 tablet by mouth every 6 (six) hours as needed. 04/04/23  Yes Gerhard Munch, MD  predniSONE (DELTASONE) 20 MG tablet Take 2 tablets (40 mg total) by mouth daily with breakfast. For the next four days 04/04/23  Yes Gerhard Munch, MD  acetaminophen (TYLENOL) 500 MG tablet Take 1,000 mg by mouth every 6 (six) hours as needed for moderate pain (pain score 4-6).    [provider]  allopurinol (ZYLOPRIM) 100 MG tablet Take 1 tablet (100 mg total) by mouth daily. 02/28/23     ALPRAZolam (XANAX) 0.5 MG tablet Take one tablet 30-60 minutes before MRI. Can take a second tablet if needed right before MRI Patient not taking: Reported on 04/04/2023 03/01/23   Shirline Frees, NP  amitriptyline (ELAVIL) 25 MG  tablet Take 1 tablet (25 mg total) by mouth at bedtime. 01/17/23   Nafziger, Kandee Keen, NP  apixaban (ELIQUIS) 5 MG TABS tablet Take 1 tablet (5 mg total) by mouth 2 (two) times daily. 02/21/23   Shirline Frees, NP  b complex vitamins capsule Take 1 capsule by mouth daily.    [provider]  Cholecalciferol (D3 PO) Take 1 tablet by mouth daily.    [provider]  colchicine 0.6 MG tablet Take 1 tablet (0.6 mg total) by mouth daily. as needed for gout 02/28/23     diltiazem (CARTIA XT) 120 MG 24 hr capsule Take 1 capsule (120 mg total) by mouth daily. 02/03/23   Ronney Asters, NP  Insulin NPH, Human,, Isophane, (HUMULIN N KWIKPEN) 100 UNIT/ML Kiwkpen Inject 20 units into the skin every morning and 14 units every evening. 11/01/22     insulin regular (HUMULIN R) 100 units/mL injection Inject 0.1 mLs (10 Units total) into the skin 3 (three) times daily before meals 01/09/23     Insulin Syringe-Needle U-100 (ULTICARE INSULIN SYRINGE) 31G X 5/16" 1 ML MISC Inject 4 times daily subcutaneously 12/04/22   Nafziger, Kandee Keen, NP  metFORMIN (GLUCOPHAGE-XR) 500 MG 24 hr tablet Take 1 tablet by mouth 2 times daily 08/29/22     Multiple Vitamin (MULITIVITAMIN WITH MINERALS) TABS Take 1 tablet by mouth daily.    [provider]  nitroGLYCERIN (NITROSTAT) 0.4 MG SL tablet Place 1 tablet (0.4 mg total) under the tongue every 5 (five) minutes as needed for chest pain. 11/14/21  Lyn Records, MD  omeprazole (PRILOSEC) 20 MG capsule Take 1 capsule (20 mg total) by mouth daily. 03/06/23   [name], Willaim Rayas, MD  omeprazole (PRILOSEC) 20 MG capsule Take 1 capsule (20 mg total) by mouth daily. 04/04/23   [name], [name], PA-C  ondansetron (ZOFRAN) 4 MG tablet Take 1 tablet (4 mg total) by mouth every 8 (eight) hours as needed for nausea or vomiting. 04/04/23   [name], [name], PA-C  polyethylene glycol (MIRALAX) 17 g packet Take 17 g by mouth daily as needed. 12/19/22   [name], Willaim Rayas, MD   potassium chloride SA (KLOR-CON M) 20 MEQ tablet Take 1 tablet (20 mEq total) by mouth daily. Take in concurrence with torsemide. 11/07/22   Nafziger, Kandee Keen, NP  simvastatin (ZOCOR) 10 MG tablet Take 2 tablets (20 mg total) by mouth daily. 02/08/23   Micki Riley, MD  SYNTHROID 137 MCG tablet Take 1 tablet (137 mcg total) by mouth every morning on empty stomach. 01/24/23     tiZANidine (ZANAFLEX) 4 MG tablet Take 1 tablet (4 mg total) by mouth 3 (three) times daily. Patient taking differently: Take 4 mg by mouth daily as needed for muscle spasms. 01/09/23     torsemide (DEMADEX) 20 MG tablet Take 2 tablets (40 mg total) by mouth daily. Patient taking differently: Take 20 mg by mouth daily. 10/08/22   Ronney Asters, NP  Vibegron (GEMTESA) 75 MG TABS Take 75 mg by mouth at bedtime.    [provider]  vitamin E 180 MG (400 UNITS) capsule Take 400 Units by mouth daily.    [provider]      Allergies    Patient has no known allergies.    Review of Systems   Review of Systems  Physical Exam Updated Vital Signs BP (!) 151/73   Pulse 83   Temp 98.2 F (36.8 C) (Oral)   Resp (!) 22   Ht 5\' 5"  (1.651 m)   Wt 79.8 kg   SpO2 98%   BMI 29.29 kg/m  Physical Exam Vitals and nursing note reviewed.  Constitutional:      General: She is not in acute distress.    Appearance: She is well-developed.  HENT:     Head: Normocephalic and atraumatic.  Eyes:     Conjunctiva/sclera: Conjunctivae normal.  Cardiovascular:     Rate and Rhythm: Normal rate and regular rhythm.  Pulmonary:     Effort: Pulmonary effort is normal. Tachypnea present. No respiratory distress.     Breath sounds: Normal breath sounds. No stridor. No decreased breath sounds.  Abdominal:     General: There is no distension.  Skin:    General: Skin is warm and dry.  Neurological:     Mental Status: She is alert and oriented to person, place, and time.     Cranial Nerves: No cranial nerve deficit.   Psychiatric:        Mood and Affect: Mood is anxious.     ED Results / Procedures / Treatments   Labs (all labs ordered are listed, but only abnormal results are displayed) Labs Reviewed  COMPREHENSIVE METABOLIC PANEL - Abnormal; Notable for the following components:      Result Value   Chloride 113 (*)    CO2 17 (*)    Glucose, Bld 123 (*)    All other components within normal limits  CBC WITH DIFFERENTIAL/PLATELET - Abnormal; Notable for the following components:   Hemoglobin 9.7 (*)  HCT 32.0 (*)    MCV 62.6 (*)    MCH 19.0 (*)    RDW 16.8 (*)    All other components within normal limits  BRAIN NATRIURETIC PEPTIDE - Abnormal; Notable for the following components:   B Natriuretic Peptide 401.3 (*)    All other components within normal limits  LIPASE, BLOOD  URINALYSIS, ROUTINE W REFLEX MICROSCOPIC    EKG EKG Interpretation Date/Time:  Thursday April 04 2023 20:36:08 EST Ventricular Rate:  88 PR Interval:    QRS Duration:  166 QT Interval:  451 QTC Calculation: 546 R Axis:   -60  Text Interpretation: Atrial fibrillation Left bundle branch block Confirmed by Gerhard Munch [phone]) on 04/04/2023 9:01:17 PM  Radiology DG Chest 2 View Result Date: 04/04/2023 CLINICAL DATA:  Shortness of breath EXAM: CHEST - 2 VIEW COMPARISON:  03/24/2023 FINDINGS: Heart is borderline in size. Mediastinal contours within normal limits. Aortic atherosclerosis. No overt edema or effusions. No acute bony abnormality. IMPRESSION: No active cardiopulmonary disease. Electronically Signed   By: Charlett Nose M.D.   On: 04/04/2023 21:36    Procedures Procedures    Medications Ordered in ED Medications  morphine (PF) 4 MG/ML injection 4 mg (4 mg Intravenous Given 04/04/23 2116)  furosemide (LASIX) injection 40 mg (40 mg Intravenous Given 04/04/23 2303)  ondansetron (ZOFRAN) injection 4 mg (4 mg Intravenous Given 04/04/23 2259)  HYDROmorphone (DILAUDID) injection 0.5 mg (0.5 mg  Intravenous Given 04/04/23 2306)  dexamethasone (DECADRON) injection 10 mg (10 mg Intravenous Given 04/04/23 2301)    ED Course/ Medical Decision Making/ A&P                                 Medical Decision Making Adult female with multiple medical issues including A-fib, diabetes, hypertension, coronary disease, and prior notation of intractable headache presents with pain in multiple areas, dyspnea.  Broad differential including acute on chronic pain, pneumonia, bacteremia, sepsis.  PA consideration, but she had CT angiography within the past 10 days which was normal and is on Eliquis.  Absence of neuro specific complaints or findings reassuring for low suspicion of intracranial abnormality and patient had head CT within the past 10 days also normal. Cardiac 85 sinus normal Pulse ox 100% room air normal   Amount and/or Complexity of Data Reviewed External Data Reviewed: notes. Labs: ordered. Decision-making details documented in ED Course. Radiology: ordered and independent interpretation performed. Decision-making details documented in ED Course. ECG/medicine tests: ordered and independent interpretation performed. Decision-making details documented in ED Course.    Details: EKG similar to EMS rhythm strip which is sinus rhythm with substantial artifact and wander.  Risk Prescription drug management.  Update: Patient accompanied by her husband.  We had a lengthy conversation about patient's presentation, history, and as above I reviewed her CT scans from last week.  Today's findings essentially reassuring, with elevated BNP, but no new oxygen requirement, no increased work of breathing, no x-ray suggesting substantial fluid overload status.  On discussing this with the patient and her husband, is clear the patient saw her cardiologist yesterday was advised to increase her diuretics, consistent with today's findings.  She has not yet done that.  She continues to complain of back pain, and  on additional chart review is clear the patient is also supposed to follow-up with her rheumatologist as she has been having chronic pain.  She has not recently seen her rheumatologist. 11:56 PM  Patient awake, alert, now after second dose of pain medication is speaking much more clearly.  We discussed the patient's presentation again, generally reassuring labs, vitals, patient is awaiting x-rays of her back, urinalysis results, I discussed that if these tests are unremarkable she will go home to follow-up with primary care with increased diuretics and ongoing steroids.  Abnormal findings will be considered by oncoming care team.        Final Clinical Impression(s) / ED Diagnoses Final diagnoses:  SOB (shortness of breath)  Midline thoracic back pain, unspecified chronicity    Rx / DC Orders ED Discharge Orders          Ordered    HYDROcodone-acetaminophen (NORCO/VICODIN) 5-325 MG tablet  Every 6 hours PRN        04/04/23 2354    predniSONE (DELTASONE) 20 MG tablet  Daily with breakfast        04/04/23 2354              Gerhard Munch, MD 04/04/23 2356

## 2023-04-04 NOTE — Telephone Encounter (Signed)
Patient identification verified by 2 forms. Shade Flood, RN     Called and spoke to patient  Patient states: she has not started increased dose of torsemide yet due to having appts over the next few days. Will complete increased dose this Friday, Saturday and Sunday.   Patient denies: new symptoms since last conversation with triage nurse.   Objective data: Patient did not sound SOB when speaking to me on the phone.              Interventions/Plan: FU appt scheduled with APP for 12/19 at 9:15am. Patient made aware that she is to come to [facility] to get BMP done 1 week after finishing last increased dose of torsemide.    Reviewed ED warning signs/precautions  Patient agrees with plan, no questions at this time

## 2023-04-04 NOTE — ED Triage Notes (Signed)
BIB EMS - Sudden onset of SOB, back pain, lower neck pain, nausea, anxiety, and impending doom. Pain under breasts. EMS states gradual increase in elevation on EKG. 8/10 pain on arrival.

## 2023-04-04 NOTE — Patient Instructions (Addendum)
Your provider has requested that you go to the basement level for lab work before leaving today. Press "B" on the elevator. The lab is located at the first door on the left as you exit the elevator.  Start Miralax 1 capful daily in 8 ounces of liquid.  _______________________________________________________  If your blood pressure at your visit was 140/90 or greater, please contact your primary care physician to follow up on this.  _______________________________________________________  If you are age 79 or older, your body mass index should be between 23-30. Your There is no height or weight on file to calculate BMI. If this is out of the aforementioned range listed, please consider follow up with your Primary Care Provider.  If you are age 81 or younger, your body mass index should be between 19-25. Your There is no height or weight on file to calculate BMI. If this is out of the aformentioned range listed, please consider follow up with your Primary Care Provider.   ________________________________________________________  The [facility] providers would like to encourage you to use [facility] to communicate with providers for non-urgent requests or questions.  Due to long hold times on the telephone, sending your provider a message by [facility] may be a faster and more efficient way to get a response.  Please allow 48 business hours for a response.  Please remember that this is for non-urgent requests.  _______________________________________________________ It was a pleasure to see you today!  Thank you for trusting me with your gastrointestinal care!

## 2023-04-04 NOTE — Discharge Instructions (Addendum)
Today's evaluation has been generally reassuring.  With your ongoing pain, it is important to follow-up with your primary care physician and hopefully with your rheumatologist as well. There is some evidence that your body is retaining excess fluid.  As you also discussed with your cardiologist yesterday, please take your increased regimen of fluid pills until you have followed up with your cardiologist or your primary care physician.

## 2023-04-05 ENCOUNTER — Other Ambulatory Visit (HOSPITAL_BASED_OUTPATIENT_CLINIC_OR_DEPARTMENT_OTHER): Payer: Self-pay

## 2023-04-05 ENCOUNTER — Other Ambulatory Visit: Payer: Self-pay

## 2023-04-05 ENCOUNTER — Emergency Department (HOSPITAL_COMMUNITY): Payer: Medicare Other

## 2023-04-05 ENCOUNTER — Other Ambulatory Visit: Payer: Medicare Other

## 2023-04-05 DIAGNOSIS — Z794 Long term (current) use of insulin: Secondary | ICD-10-CM | POA: Diagnosis not present

## 2023-04-05 DIAGNOSIS — I5031 Acute diastolic (congestive) heart failure: Secondary | ICD-10-CM | POA: Diagnosis not present

## 2023-04-05 DIAGNOSIS — I2489 Other forms of acute ischemic heart disease: Secondary | ICD-10-CM | POA: Diagnosis present

## 2023-04-05 DIAGNOSIS — E039 Hypothyroidism, unspecified: Secondary | ICD-10-CM | POA: Diagnosis present

## 2023-04-05 DIAGNOSIS — E876 Hypokalemia: Secondary | ICD-10-CM | POA: Diagnosis present

## 2023-04-05 DIAGNOSIS — N179 Acute kidney failure, unspecified: Secondary | ICD-10-CM | POA: Diagnosis present

## 2023-04-05 DIAGNOSIS — M545 Low back pain, unspecified: Secondary | ICD-10-CM | POA: Diagnosis not present

## 2023-04-05 DIAGNOSIS — G20A1 Parkinson's disease without dyskinesia, without mention of fluctuations: Secondary | ICD-10-CM | POA: Diagnosis present

## 2023-04-05 DIAGNOSIS — G8929 Other chronic pain: Secondary | ICD-10-CM | POA: Diagnosis present

## 2023-04-05 DIAGNOSIS — I480 Paroxysmal atrial fibrillation: Secondary | ICD-10-CM | POA: Diagnosis present

## 2023-04-05 DIAGNOSIS — R918 Other nonspecific abnormal finding of lung field: Secondary | ICD-10-CM | POA: Diagnosis not present

## 2023-04-05 DIAGNOSIS — E781 Pure hyperglyceridemia: Secondary | ICD-10-CM | POA: Diagnosis present

## 2023-04-05 DIAGNOSIS — G4733 Obstructive sleep apnea (adult) (pediatric): Secondary | ICD-10-CM | POA: Diagnosis present

## 2023-04-05 DIAGNOSIS — Z79890 Hormone replacement therapy (postmenopausal): Secondary | ICD-10-CM | POA: Diagnosis not present

## 2023-04-05 DIAGNOSIS — E1165 Type 2 diabetes mellitus with hyperglycemia: Secondary | ICD-10-CM | POA: Diagnosis present

## 2023-04-05 DIAGNOSIS — I272 Pulmonary hypertension, unspecified: Secondary | ICD-10-CM | POA: Diagnosis present

## 2023-04-05 DIAGNOSIS — E871 Hypo-osmolality and hyponatremia: Secondary | ICD-10-CM | POA: Diagnosis present

## 2023-04-05 DIAGNOSIS — Z952 Presence of prosthetic heart valve: Secondary | ICD-10-CM | POA: Diagnosis not present

## 2023-04-05 DIAGNOSIS — R29818 Other symptoms and signs involving the nervous system: Secondary | ICD-10-CM | POA: Diagnosis not present

## 2023-04-05 DIAGNOSIS — E038 Other specified hypothyroidism: Secondary | ICD-10-CM | POA: Diagnosis not present

## 2023-04-05 DIAGNOSIS — G20C Parkinsonism, unspecified: Secondary | ICD-10-CM | POA: Diagnosis not present

## 2023-04-05 DIAGNOSIS — I10 Essential (primary) hypertension: Secondary | ICD-10-CM | POA: Diagnosis not present

## 2023-04-05 DIAGNOSIS — F419 Anxiety disorder, unspecified: Secondary | ICD-10-CM | POA: Diagnosis present

## 2023-04-05 DIAGNOSIS — M47814 Spondylosis without myelopathy or radiculopathy, thoracic region: Secondary | ICD-10-CM | POA: Diagnosis not present

## 2023-04-05 DIAGNOSIS — D563 Thalassemia minor: Secondary | ICD-10-CM | POA: Diagnosis present

## 2023-04-05 DIAGNOSIS — M549 Dorsalgia, unspecified: Secondary | ICD-10-CM | POA: Diagnosis not present

## 2023-04-05 DIAGNOSIS — M47816 Spondylosis without myelopathy or radiculopathy, lumbar region: Secondary | ICD-10-CM | POA: Diagnosis present

## 2023-04-05 DIAGNOSIS — I5033 Acute on chronic diastolic (congestive) heart failure: Secondary | ICD-10-CM | POA: Diagnosis present

## 2023-04-05 DIAGNOSIS — D509 Iron deficiency anemia, unspecified: Secondary | ICD-10-CM | POA: Diagnosis present

## 2023-04-05 DIAGNOSIS — E1169 Type 2 diabetes mellitus with other specified complication: Secondary | ICD-10-CM | POA: Diagnosis present

## 2023-04-05 DIAGNOSIS — I110 Hypertensive heart disease with heart failure: Secondary | ICD-10-CM | POA: Diagnosis present

## 2023-04-05 DIAGNOSIS — J90 Pleural effusion, not elsewhere classified: Secondary | ICD-10-CM | POA: Diagnosis not present

## 2023-04-05 DIAGNOSIS — I2510 Atherosclerotic heart disease of native coronary artery without angina pectoris: Secondary | ICD-10-CM | POA: Diagnosis not present

## 2023-04-05 DIAGNOSIS — Z79899 Other long term (current) drug therapy: Secondary | ICD-10-CM | POA: Diagnosis not present

## 2023-04-05 DIAGNOSIS — F32A Depression, unspecified: Secondary | ICD-10-CM | POA: Diagnosis present

## 2023-04-05 DIAGNOSIS — R079 Chest pain, unspecified: Secondary | ICD-10-CM | POA: Diagnosis not present

## 2023-04-05 LAB — COMPREHENSIVE METABOLIC PANEL
ALT: 23 U/L (ref 0–44)
AST: 35 U/L (ref 15–41)
Albumin: 3.9 g/dL (ref 3.5–5.0)
Alkaline Phosphatase: 78 U/L (ref 38–126)
Anion gap: 12 (ref 5–15)
BUN: 17 mg/dL (ref 8–23)
CO2: 18 mmol/L — ABNORMAL LOW (ref 22–32)
Calcium: 9.7 mg/dL (ref 8.9–10.3)
Chloride: 107 mmol/L (ref 98–111)
Creatinine, Ser: 0.86 mg/dL (ref 0.44–1.00)
GFR, Estimated: >60 mL/min (ref >60)
Glucose, Bld: 244 mg/dL — ABNORMAL HIGH (ref 70–99)
Potassium: 4.2 mmol/L (ref 3.5–5.1)
Sodium: 137 mmol/L (ref 135–145)
Total Bilirubin: 0.8 mg/dL (ref <1.2)
Total Protein: 7 g/dL (ref 6.5–8.1)

## 2023-04-05 LAB — I-STAT ARTERIAL BLOOD GAS, ED
Acid-base deficit: 4 mmol/L — ABNORMAL HIGH (ref 0.0–2.0)
Bicarbonate: 21.4 mmol/L (ref 20.0–28.0)
Calcium, Ion: 1.29 mmol/L (ref 1.15–1.40)
HCT: 31 % — ABNORMAL LOW (ref 36.0–46.0)
Hemoglobin: 10.5 g/dL — ABNORMAL LOW (ref 12.0–15.0)
O2 Saturation: 96 %
Patient temperature: 36.7
Potassium: 3.7 mmol/L (ref 3.5–5.1)
Sodium: 137 mmol/L (ref 135–145)
TCO2: 23 mmol/L (ref 22–32)
pCO2 arterial: 37 mm[Hg] (ref 32–48)
pH, Arterial: 7.369 (ref 7.35–7.45)
pO2, Arterial: 84 mm[Hg] (ref 83–108)

## 2023-04-05 LAB — CBC WITH DIFFERENTIAL/PLATELET
Abs Immature Granulocytes: 0 10*3/uL (ref 0.00–0.07)
Band Neutrophils: 2 %
Basophils Absolute: 0.1 10*3/uL (ref 0.0–0.1)
Basophils Relative: 2 %
Eosinophils Absolute: 0 10*3/uL (ref 0.0–0.5)
Eosinophils Relative: 1 %
HCT: 32.6 % — ABNORMAL LOW (ref 36.0–46.0)
Hemoglobin: 10.1 g/dL — ABNORMAL LOW (ref 12.0–15.0)
Lymphocytes Relative: 13 %
Lymphs Abs: 0.6 10*3/uL — ABNORMAL LOW (ref 0.7–4.0)
MCH: 18.9 pg — ABNORMAL LOW (ref 26.0–34.0)
MCHC: 31 g/dL (ref 30.0–36.0)
MCV: 61.2 fL — ABNORMAL LOW (ref 80.0–100.0)
Monocytes Absolute: 0.1 10*3/uL (ref 0.1–1.0)
Monocytes Relative: 3 %
Neutro Abs: 4 10*3/uL (ref 1.7–7.7)
Neutrophils Relative %: 79 %
Platelets: 150 10*3/uL (ref 150–400)
RBC: 5.33 MIL/uL — ABNORMAL HIGH (ref 3.87–5.11)
RDW: 16.9 % — ABNORMAL HIGH (ref 11.5–15.5)
WBC: 4.9 10*3/uL (ref 4.0–10.5)
nRBC: 0 % (ref 0.0–0.2)

## 2023-04-05 LAB — CBG MONITORING, ED
Glucose-Capillary: 310 mg/dL — ABNORMAL HIGH (ref 70–99)
Glucose-Capillary: 268 mg/dL — ABNORMAL HIGH (ref 70–99)

## 2023-04-05 LAB — URINALYSIS, ROUTINE W REFLEX MICROSCOPIC
Bilirubin Urine: NEGATIVE
Glucose, UA: NEGATIVE mg/dL
Hgb urine dipstick: NEGATIVE
Ketones, ur: NEGATIVE mg/dL
Leukocytes,Ua: NEGATIVE
Nitrite: NEGATIVE
Protein, ur: NEGATIVE mg/dL
Specific Gravity, Urine: 1.011 (ref 1.005–1.030)
pH: 5 (ref 5.0–8.0)

## 2023-04-05 LAB — TROPONIN I (HIGH SENSITIVITY)
Troponin I (High Sensitivity): 48 ng/L — ABNORMAL HIGH (ref <18)
Troponin I (High Sensitivity): 42 ng/L — ABNORMAL HIGH (ref <18)
Troponin I (High Sensitivity): 32 ng/L — ABNORMAL HIGH (ref <18)
Troponin I (High Sensitivity): 32 ng/L — ABNORMAL HIGH (ref <18)

## 2023-04-05 LAB — AFP TUMOR MARKER: AFP-Tumor Marker: 2.5 ng/mL

## 2023-04-05 LAB — PHOSPHORUS: Phosphorus: 3.9 mg/dL (ref 2.5–4.6)

## 2023-04-05 LAB — GLUCOSE, CAPILLARY
Glucose-Capillary: 301 mg/dL — ABNORMAL HIGH (ref 70–99)
Glucose-Capillary: 222 mg/dL — ABNORMAL HIGH (ref 70–99)

## 2023-04-05 LAB — MAGNESIUM: Magnesium: 1.7 mg/dL (ref 1.7–2.4)

## 2023-04-05 MED ORDER — MELATONIN 3 MG PO TABS
3.0000 mg | ORAL_TABLET | Freq: Every evening | ORAL | Status: DC | PRN
  Administered 2023-04-05: 3 mg via ORAL
  Filled 2023-04-05: qty 1

## 2023-04-05 MED ORDER — FUROSEMIDE 10 MG/ML IJ SOLN
40.0000 mg | Freq: Two times a day (BID) | INTRAMUSCULAR | Status: DC
  Administered 2023-04-05 – 2023-04-07 (×5): 40 mg via INTRAVENOUS
  Filled 2023-04-05 (×5): qty 4

## 2023-04-05 MED ORDER — INSULIN NPH (HUMAN) (ISOPHANE) 100 UNIT/ML ~~LOC~~ SUSP
14.0000 [IU] | Freq: Every day | SUBCUTANEOUS | Status: DC
  Administered 2023-04-05 – 2023-04-08 (×4): 14 [IU] via SUBCUTANEOUS
  Filled 2023-04-05: qty 10

## 2023-04-05 MED ORDER — IOHEXOL 350 MG/ML SOLN
65.0000 mL | Freq: Once | INTRAVENOUS | Status: AC | PRN
  Administered 2023-04-05: 65 mL via INTRAVENOUS

## 2023-04-05 MED ORDER — POTASSIUM CHLORIDE CRYS ER 20 MEQ PO TBCR
40.0000 meq | EXTENDED_RELEASE_TABLET | Freq: Once | ORAL | Status: AC
  Administered 2023-04-05: 40 meq via ORAL
  Filled 2023-04-05: qty 2

## 2023-04-05 MED ORDER — DILTIAZEM HCL ER COATED BEADS 120 MG PO CP24
120.0000 mg | ORAL_CAPSULE | Freq: Every day | ORAL | Status: DC
  Administered 2023-04-05 – 2023-04-09 (×5): 120 mg via ORAL
  Filled 2023-04-05 (×5): qty 1

## 2023-04-05 MED ORDER — EMPAGLIFLOZIN 10 MG PO TABS
10.0000 mg | ORAL_TABLET | Freq: Every day | ORAL | Status: DC
Start: 2023-04-05 — End: 2023-04-09
  Administered 2023-04-05 – 2023-04-09 (×5): 10 mg via ORAL
  Filled 2023-04-05 (×5): qty 1

## 2023-04-05 MED ORDER — LORAZEPAM 2 MG/ML IJ SOLN: 1.0000 mg | Freq: Once | INTRAMUSCULAR | Status: DC | PRN

## 2023-04-05 MED ORDER — SODIUM CHLORIDE 0.9% FLUSH
3.0000 mL | Freq: Two times a day (BID) | INTRAVENOUS | Status: DC
Start: 2023-04-05 — End: 2023-04-09
  Administered 2023-04-05 – 2023-04-09 (×9): 3 mL via INTRAVENOUS

## 2023-04-05 MED ORDER — TIZANIDINE HCL 4 MG PO TABS
4.0000 mg | ORAL_TABLET | Freq: Every day | ORAL | Status: DC | PRN
  Administered 2023-04-05 – 2023-04-08 (×3): 4 mg via ORAL
  Filled 2023-04-05 (×3): qty 1

## 2023-04-05 MED ORDER — INSULIN ASPART 100 UNIT/ML IJ SOLN
0.0000 [IU] | Freq: Three times a day (TID) | INTRAMUSCULAR | Status: DC
  Administered 2023-04-05: 11 [IU] via SUBCUTANEOUS
  Administered 2023-04-05: 8 [IU] via SUBCUTANEOUS
  Administered 2023-04-05: 11 [IU] via SUBCUTANEOUS
  Administered 2023-04-06: 2 [IU] via SUBCUTANEOUS
  Administered 2023-04-06 (×2): 3 [IU] via SUBCUTANEOUS
  Administered 2023-04-07 – 2023-04-08 (×4): 2 [IU] via SUBCUTANEOUS
  Administered 2023-04-08: 3 [IU] via SUBCUTANEOUS
  Administered 2023-04-09 (×2): 2 [IU] via SUBCUTANEOUS

## 2023-04-05 MED ORDER — AMITRIPTYLINE HCL 25 MG PO TABS
25.0000 mg | ORAL_TABLET | Freq: Every day | ORAL | Status: DC
Start: 2023-04-05 — End: 2023-04-09
  Administered 2023-04-05 – 2023-04-08 (×4): 25 mg via ORAL
  Filled 2023-04-05 (×4): qty 1

## 2023-04-05 MED ORDER — ALLOPURINOL 100 MG PO TABS
100.0000 mg | ORAL_TABLET | Freq: Every day | ORAL | Status: DC
  Administered 2023-04-05 – 2023-04-09 (×5): 100 mg via ORAL
  Filled 2023-04-05 (×5): qty 1

## 2023-04-05 MED ORDER — NITROGLYCERIN 0.4 MG SL SUBL: 0.4000 mg | SUBLINGUAL_TABLET | SUBLINGUAL | Status: DC | PRN

## 2023-04-05 MED ORDER — APIXABAN 5 MG PO TABS
5.0000 mg | ORAL_TABLET | Freq: Two times a day (BID) | ORAL | Status: DC
Start: 2023-04-05 — End: 2023-04-09
  Administered 2023-04-05 – 2023-04-09 (×9): 5 mg via ORAL
  Filled 2023-04-05 (×9): qty 1

## 2023-04-05 MED ORDER — ACETAMINOPHEN 650 MG RE SUPP: 650.0000 mg | Freq: Four times a day (QID) | RECTAL | Status: DC | PRN

## 2023-04-05 MED ORDER — LORAZEPAM 2 MG/ML IJ SOLN
0.5000 mg | Freq: Once | INTRAMUSCULAR | Status: AC
  Administered 2023-04-05: 0.5 mg via INTRAVENOUS
  Filled 2023-04-05: qty 1

## 2023-04-05 MED ORDER — DOCUSATE SODIUM 100 MG PO CAPS
100.0000 mg | ORAL_CAPSULE | Freq: Two times a day (BID) | ORAL | Status: DC
  Administered 2023-04-05 – 2023-04-09 (×9): 100 mg via ORAL
  Filled 2023-04-05 (×9): qty 1

## 2023-04-05 MED ORDER — LORAZEPAM 0.5 MG PO TABS
0.5000 mg | ORAL_TABLET | Freq: Once | ORAL | Status: AC | PRN
  Administered 2023-04-05: 0.5 mg via ORAL
  Filled 2023-04-05: qty 1

## 2023-04-05 MED ORDER — OXYCODONE HCL 5 MG PO TABS
5.0000 mg | ORAL_TABLET | Freq: Four times a day (QID) | ORAL | Status: DC | PRN
  Administered 2023-04-05 – 2023-04-08 (×7): 5 mg via ORAL
  Filled 2023-04-05 (×7): qty 1

## 2023-04-05 MED ORDER — MIRABEGRON ER 25 MG PO TB24
25.0000 mg | ORAL_TABLET | Freq: Every day | ORAL | Status: DC
  Administered 2023-04-05 – 2023-04-09 (×5): 25 mg via ORAL
  Filled 2023-04-05 (×5): qty 1

## 2023-04-05 MED ORDER — LEVOTHYROXINE SODIUM 25 MCG PO TABS
137.0000 ug | ORAL_TABLET | Freq: Every morning | ORAL | Status: DC
  Administered 2023-04-05 – 2023-04-09 (×5): 137 ug via ORAL
  Filled 2023-04-05 (×6): qty 1

## 2023-04-05 MED ORDER — ONDANSETRON HCL 4 MG/2ML IJ SOLN
4.0000 mg | Freq: Once | INTRAMUSCULAR | Status: AC
  Administered 2023-04-05: 4 mg via INTRAVENOUS
  Filled 2023-04-05: qty 2

## 2023-04-05 MED ORDER — FUROSEMIDE 10 MG/ML IJ SOLN
60.0000 mg | Freq: Once | INTRAMUSCULAR | Status: AC
  Administered 2023-04-05: 60 mg via INTRAVENOUS
  Filled 2023-04-05: qty 6

## 2023-04-05 MED ORDER — PANTOPRAZOLE SODIUM 40 MG PO TBEC
40.0000 mg | DELAYED_RELEASE_TABLET | Freq: Every day | ORAL | Status: DC
  Administered 2023-04-05 – 2023-04-09 (×5): 40 mg via ORAL
  Filled 2023-04-05 (×5): qty 1

## 2023-04-05 MED ORDER — INSULIN NPH (HUMAN) (ISOPHANE) 100 UNIT/ML ~~LOC~~ SUSP
20.0000 [IU] | Freq: Every day | SUBCUTANEOUS | Status: DC
  Administered 2023-04-05 – 2023-04-09 (×5): 20 [IU] via SUBCUTANEOUS
  Filled 2023-04-05: qty 10

## 2023-04-05 MED ORDER — ATORVASTATIN CALCIUM 10 MG PO TABS
20.0000 mg | ORAL_TABLET | Freq: Every day | ORAL | Status: DC
  Administered 2023-04-05 – 2023-04-08 (×4): 20 mg via ORAL
  Filled 2023-04-05 (×4): qty 2

## 2023-04-05 MED ORDER — COLCHICINE 0.6 MG PO TABS
0.6000 mg | ORAL_TABLET | Freq: Every day | ORAL | Status: DC
  Administered 2023-04-05 – 2023-04-09 (×5): 0.6 mg via ORAL
  Filled 2023-04-05 (×5): qty 1

## 2023-04-05 MED ORDER — ONDANSETRON HCL 4 MG/2ML IJ SOLN
4.0000 mg | Freq: Four times a day (QID) | INTRAMUSCULAR | Status: DC | PRN
  Administered 2023-04-06 – 2023-04-07 (×2): 4 mg via INTRAVENOUS
  Filled 2023-04-05 (×2): qty 2

## 2023-04-05 MED ORDER — METFORMIN HCL ER 500 MG PO TB24
500.0000 mg | ORAL_TABLET | Freq: Two times a day (BID) | ORAL | Status: DC
  Filled 2023-04-05 (×2): qty 1

## 2023-04-05 MED ORDER — POTASSIUM CHLORIDE CRYS ER 20 MEQ PO TBCR
20.0000 meq | EXTENDED_RELEASE_TABLET | Freq: Every day | ORAL | Status: DC
  Administered 2023-04-06 – 2023-04-09 (×4): 20 meq via ORAL
  Filled 2023-04-05 (×4): qty 1

## 2023-04-05 MED ORDER — INSULIN ASPART 100 UNIT/ML IJ SOLN
0.0000 [IU] | Freq: Every day | INTRAMUSCULAR | Status: DC
  Administered 2023-04-05: 2 [IU] via SUBCUTANEOUS

## 2023-04-05 MED ORDER — ACETAMINOPHEN 325 MG PO TABS
650.0000 mg | ORAL_TABLET | Freq: Four times a day (QID) | ORAL | Status: DC | PRN
  Administered 2023-04-05 – 2023-04-08 (×4): 650 mg via ORAL
  Filled 2023-04-05 (×4): qty 2

## 2023-04-05 NOTE — ED Notes (Signed)
ED TO INPATIENT HANDOFF REPORT  ED Nurse Name and Phone #: Victorino Dike [phone]  S Name/Age/Gender Breanna Webster 77 y.o. female Room/Bed: 045C/045C  Code Status   Code Status: Full Code  Home/SNF/Other Home Patient oriented to: self, place, time, and situation Is this baseline? Yes   Triage Complete: Triage complete  Chief Complaint Acute on chronic diastolic heart failure (HCC) [I50.33]  Triage Note BIB EMS - Sudden onset of SOB, back pain, lower neck pain, nausea, anxiety, and impending doom. Pain under breasts. EMS states gradual increase in elevation on EKG. 8/10 pain on arrival.   Allergies No Known Allergies  Level of Care/Admitting Diagnosis ED Disposition     ED Disposition  Admit   Condition  --   Comment  Hospital Area: [facility] [[id number]]  Level of Care: Telemetry Cardiac [[id number]]  May admit patient to [facility] or [facility] if equivalent level of care is available:: No  Covid Evaluation: Asymptomatic - no recent exposure (last 10 days) testing not required  Diagnosis: Acute on chronic diastolic heart failure (HCC) [428.33.ICD-9-CM]  Admitting Physician: Angie Fava [[id number]]  Attending Physician: Angie Fava [[id number]]  Certification:: I certify this patient will need inpatient services for at least 2 midnights  Expected Medical Readiness: 04/07/2023          B Medical/Surgery History Past Medical History:  Diagnosis Date   Anxiety    Arthritis    "back" (04/22/2018)   Atrial fibrillation with RVR (HCC) 10/21/2018   Back pain    Bacteremia due to Streptococcus Salivarius 10/07/2018   Blood transfusion without reported diagnosis    CAD (coronary artery disease)    a. 03/2018 s/p PCI/DES to the RCA (3.0x15 Onyx DES).   Carotid artery stenosis    Mild   Cerebellar stroke, acute (HCC) 10/21/2018   Cerebrovascular accident (CVA) due to embolism of right posterior cerebral artery (HCC) 08/03/2020   Chest pain     CHF (congestive heart failure) (HCC)    Chronic lower back pain    Cirrhosis (HCC)    Colon polyps    Diverticulitis    Diverticulosis    Esophageal thickening    seen on pre TAVR CT scan, also questionable cirrhosis. MRI recommended. Will refer to GI   GERD (gastroesophageal reflux disease)    Gout    Grave's disease    H/O total shoulder replacement, left 06/01/2022   Heart murmur    History of cardioembolic stroke 03/29/2020   History of colonic polyps 05/22/2017   History of hiatal hernia    Hyperlipidemia    Hypertension    Hypothyroidism    IBS (irritable bowel syndrome)    Osteopenia    Parkinson's syndrome (HCC) 04/23/2020   Pulmonary nodules    seen on pre TAVR CT. likley benign. no follow up recommended if pt low risk.   S/P TAVR (transcatheter aortic valve replacement)    Shortness of breath on exertion    Stroke (HCC) 2021   x2   Thalassemia minor    Thyroid disease    Type II diabetes mellitus (HCC)    Past Surgical History:  Procedure Laterality Date   78 HOUR PH STUDY N/A 03/03/2018   Procedure: 24 HOUR PH STUDY;  Surgeon: Napoleon Form, MD;  Location: [facility] ENDOSCOPY;  Service: Endoscopy;  Laterality: N/A;   COLONOSCOPY  2023   COLONOSCOPY W/ BIOPSIES AND POLYPECTOMY     CORONARY ANGIOGRAPHY Right 04/21/2018   Procedure: CORONARY ANGIOGRAPHY (  CATH LAB);  Surgeon: Lyn Records, MD;  Location: [facility] INVASIVE CV LAB;  Service: Cardiovascular;  Laterality: Right;   CORONARY STENT INTERVENTION N/A 04/22/2018   Procedure: CORONARY STENT INTERVENTION;  Surgeon: Lyn Records, MD;  Location: [facility] INVASIVE CV LAB;  Service: Cardiovascular;  Laterality: N/A;   DILATION AND CURETTAGE OF UTERUS     ESOPHAGEAL MANOMETRY N/A 03/03/2018   Procedure: ESOPHAGEAL MANOMETRY (EM);  Surgeon: Napoleon Form, MD;  Location: [facility] ENDOSCOPY;  Service: Endoscopy;  Laterality: N/A;   HYSTEROSCOPY     fibroids   LAPAROSCOPIC CHOLECYSTECTOMY  1985   LAPAROSCOPY     fibroids    NISSEN FUNDOPLICATION  1990s   POLYPECTOMY     REVERSE SHOULDER ARTHROPLASTY Left 06/01/2022   Procedure: REVERSE SHOULDER ARTHROPLASTY;  Surgeon: Beverely Low, MD;  Location: [facility] ORS;  Service: Orthopedics;  Laterality: Left;  choice with interscalene block   RIGHT/LEFT HEART CATH AND CORONARY ANGIOGRAPHY N/A 02/20/2018   Procedure: RIGHT/LEFT HEART CATH AND CORONARY ANGIOGRAPHY;  Surgeon: Lyn Records, MD;  Location: [facility] INVASIVE CV LAB;  Service: Cardiovascular;  Laterality: N/A;   TEE WITHOUT CARDIOVERSION N/A 07/08/2018   Procedure: TRANSESOPHAGEAL ECHOCARDIOGRAM (TEE);  Surgeon: Kathleene Hazel, MD;  Location[facility] OR;  Service: Open Heart Surgery;  Laterality: N/A;   TEE WITHOUT CARDIOVERSION  10/07/2018   TEE WITHOUT CARDIOVERSION N/A 10/07/2018   Procedure: TRANSESOPHAGEAL ECHOCARDIOGRAM (TEE);  Surgeon: Jake Bathe, MD;  Location: [facility] ENDOSCOPY;  Service: Cardiovascular;  Laterality: N/A;   TONSILLECTOMY     age 2   TRANSCATHETER AORTIC VALVE REPLACEMENT, TRANSFEMORAL N/A 07/08/2018   Procedure: TRANSCATHETER AORTIC VALVE REPLACEMENT, TRANSFEMORAL;  Surgeon: Kathleene Hazel, MD;  Location: [facility] OR;  Service: Open Heart Surgery;  Laterality: N/A;     A IV Location/Drains/Wounds Patient Lines/Drains/Airways Status     Active Line/Drains/Airways     Name Placement date Placement time Site Days   Peripheral IV 04/04/23 20 G Right Hand 04/04/23  2034  Hand  1   Peripheral IV 04/05/23 20 G Left Antecubital 04/05/23  0107  Antecubital  less than 1            Intake/Output Last 24 hours  Intake/Output Summary (Last 24 hours) at 04/05/2023 1147 Last data filed at 04/05/2023 1001 Gross per 24 hour  Intake --  Output 1125 ml  Net -1125 ml    Labs/Imaging Results for orders placed or performed during the hospital encounter of 04/04/23 (from the past 48 hours)  Comprehensive metabolic panel     Status: Abnormal   Collection Time: 04/04/23  9:14 PM  Result  Value Ref Range   Sodium 142 135 - 145 mmol/L   Potassium 3.7 3.5 - 5.1 mmol/L   Chloride 113 (H) 98 - 111 mmol/L   CO2 17 (L) 22 - 32 mmol/L   Glucose, Bld 123 (H) 70 - 99 mg/dL    Comment: Glucose reference range applies only to samples taken after fasting for at least 8 hours.   BUN 15 8 - 23 mg/dL   Creatinine, Ser 7.82 0.44 - 1.00 mg/dL   Calcium 95.6 8.9 - 21.3 mg/dL   Total Protein 6.6 6.5 - 8.1 g/dL   Albumin 3.7 3.5 - 5.0 g/dL   AST 29 15 - 41 U/L   ALT 22 0 - 44 U/L   Alkaline Phosphatase 72 38 - 126 U/L   Total Bilirubin 0.8 <1.2 mg/dL   GFR, Estimated >08 >65 mL/min  Comment: (NOTE) Calculated using the CKD-EPI Creatinine Equation (2021)    Anion gap 12 5 - 15    Comment: Performed at [facility], [address] [place], [place] [address]  CBC with Differential     Status: Abnormal   Collection Time: 04/04/23  9:14 PM  Result Value Ref Range   WBC 5.6 4.0 - 10.5 K/uL   RBC 5.11 3.87 - 5.11 MIL/uL   Hemoglobin 9.7 (L) 12.0 - 15.0 g/dL   HCT 60.4 (L) 54.0 - 98.1 %   MCV 62.6 (L) 80.0 - 100.0 fL   MCH 19.0 (L) 26.0 - 34.0 pg   MCHC 30.3 30.0 - 36.0 g/dL   RDW 19.1 (H) 47.8 - 29.5 %   Platelets 150 150 - 400 K/uL    Comment: REPEATED TO VERIFY   nRBC 0.0 0.0 - 0.2 %   Neutrophils Relative % 64 %   Neutro Abs 3.5 1.7 - 7.7 K/uL   Lymphocytes Relative 25 %   Lymphs Abs 1.4 0.7 - 4.0 K/uL   Monocytes Relative 8 %   Monocytes Absolute 0.5 0.1 - 1.0 K/uL   Eosinophils Relative 2 %   Eosinophils Absolute 0.1 0.0 - 0.5 K/uL   Basophils Relative 1 %   Basophils Absolute 0.1 0.0 - 0.1 K/uL   Immature Granulocytes 0 %   Abs Immature Granulocytes 0.02 0.00 - 0.07 K/uL    Comment: Performed at [facility], [address] [place], [place] [address]  Lipase, blood     Status: None   Collection Time: 04/04/23  9:14 PM  Result Value Ref Range   Lipase 19 11 - 51 U/L    Comment: Performed at [name], [address], [place], [place] [address]   Brain natriuretic peptide     Status: Abnormal   Collection Time: 04/04/23  9:14 PM  Result Value Ref Range   B Natriuretic Peptide 401.3 (H) 0.0 - 100.0 pg/mL    Comment: Performed at [facility], [address] [place], [place] [address]  Urinalysis, Routine w reflex microscopic -Urine, Clean Catch     Status: None   Collection Time: 04/05/23 12:01 AM  Result Value Ref Range   Color, Urine YELLOW YELLOW   APPearance CLEAR CLEAR   Specific Gravity, Urine 1.011 1.005 - 1.030   pH 5.0 5.0 - 8.0   Glucose, UA NEGATIVE NEGATIVE mg/dL   Hgb urine dipstick NEGATIVE NEGATIVE   Bilirubin Urine NEGATIVE NEGATIVE   Ketones, ur NEGATIVE NEGATIVE mg/dL   Protein, ur NEGATIVE NEGATIVE mg/dL   Nitrite NEGATIVE NEGATIVE   Leukocytes,Ua NEGATIVE NEGATIVE    Comment: Performed at [facility], [address], [place], [place] [address]  Troponin I (High Sensitivity)     Status: Abnormal   Collection Time: 04/05/23 12:41 AM  Result Value Ref Range   Troponin I (High Sensitivity) 48 (H) <18 ng/L    Comment: (NOTE) Elevated high sensitivity troponin I (hsTnI) values and significant  changes across serial measurements may suggest ACS but many other  chronic and acute conditions are known to elevate hsTnI results.  Refer to the "Links" section for chest pain algorithms and additional  guidance. Performed at [facility], [address], [place], [place] [address]   Troponin I (High Sensitivity)     Status: Abnormal   Collection Time: 04/05/23  2:22 AM  Result Value Ref Range   Troponin I (High Sensitivity)  42 (H) <18 ng/L    Comment: (NOTE) Elevated high sensitivity troponin I (hsTnI) values and significant  changes across serial measurements may suggest ACS but many other  chronic and acute conditions are known to elevate hsTnI results.  Refer to the "Links" section for chest pain algorithms and additional  guidance. Performed at [facility], [address],  [place], [place] [address]   I-Stat arterial blood gas, ED [facility], [facility], [facility])     Status: Abnormal   Collection Time: 04/05/23  2:41 AM  Result Value Ref Range   pH, Arterial 7.369 7.35 - 7.45   pCO2 arterial 37.0 32 - 48 mmHg   pO2, Arterial 84 83 - 108 mmHg   Bicarbonate 21.4 20.0 - 28.0 mmol/L   TCO2 23 22 - 32 mmol/L   O2 Saturation 96 %   Acid-base deficit 4.0 (H) 0.0 - 2.0 mmol/L   Sodium 137 135 - 145 mmol/L   Potassium 3.7 3.5 - 5.1 mmol/L   Calcium, Ion 1.29 1.15 - 1.40 mmol/L   HCT 31.0 (L) 36.0 - 46.0 %   Hemoglobin 10.5 (L) 12.0 - 15.0 g/dL   Patient temperature 19.1 C    Collection site RADIAL, ALLEN'S TEST ACCEPTABLE    Drawn by RT    Sample type ARTERIAL   CBC with Differential/Platelet     Status: Abnormal   Collection Time: 04/05/23  4:42 AM  Result Value Ref Range   WBC 4.9 4.0 - 10.5 K/uL   RBC 5.33 (H) 3.87 - 5.11 MIL/uL   Hemoglobin 10.1 (L) 12.0 - 15.0 g/dL   HCT 47.8 (L) 29.5 - 62.1 %   MCV 61.2 (L) 80.0 - 100.0 fL   MCH 18.9 (L) 26.0 - 34.0 pg   MCHC 31.0 30.0 - 36.0 g/dL   RDW 30.8 (H) 65.7 - 84.6 %   Platelets 150 150 - 400 K/uL    Comment: REPEATED TO VERIFY   nRBC 0.0 0.0 - 0.2 %   Neutrophils Relative % 79 %   Neutro Abs 4.0 1.7 - 7.7 K/uL   Band Neutrophils 2 %   Lymphocytes Relative 13 %   Lymphs Abs 0.6 (L) 0.7 - 4.0 K/uL   Monocytes Relative 3 %   Monocytes Absolute 0.1 0.1 - 1.0 K/uL   Eosinophils Relative 1 %   Eosinophils Absolute 0.0 0.0 - 0.5 K/uL   Basophils Relative 2 %   Basophils Absolute 0.1 0.0 - 0.1 K/uL   Abs Immature Granulocytes 0.00 0.00 - 0.07 K/uL    Comment: Performed at [facility], [address], [place], [place] [address]  Comprehensive metabolic panel     Status: Abnormal   Collection Time: 04/05/23  4:42 AM  Result Value Ref Range   Sodium 137 135 - 145 mmol/L   Potassium 4.2 3.5 - 5.1 mmol/L   Chloride 107 98 - 111 mmol/L   CO2 18 (L) 22 - 32 mmol/L   Glucose, Bld 244 (H) 70 - 99 mg/dL    Comment:  Glucose reference range applies only to samples taken after fasting for at least 8 hours.   BUN 17 8 - 23 mg/dL   Creatinine, Ser 2.84 0.44 - 1.00 mg/dL   Calcium 9.7 8.9 - 13.2 mg/dL   Total Protein 7.0 6.5 - 8.1 g/dL   Albumin 3.9 3.5 - 5.0 g/dL   AST 35 15 - 41 U/L   ALT 23 0 - 44 U/L   Alkaline  Phosphatase 78 38 - 126 U/L   Total Bilirubin 0.8 <1.2 mg/dL   GFR, Estimated >19 >14 mL/min    Comment: (NOTE) Calculated using the CKD-EPI Creatinine Equation (2021)    Anion gap 12 5 - 15    Comment: Performed at [facility], [address], [place], [place] [address]  Magnesium     Status: None   Collection Time: 04/05/23  4:42 AM  Result Value Ref Range   Magnesium 1.7 1.7 - 2.4 mg/dL    Comment: Performed at [facility], [address] [place], [place] [address]  Phosphorus     Status: None   Collection Time: 04/05/23  4:42 AM  Result Value Ref Range   Phosphorus 3.9 2.5 - 4.6 mg/dL    Comment: Performed at [facility], [address] [place], [place] [address]  CBG monitoring, ED     Status: Abnormal   Collection Time: 04/05/23  8:24 AM  Result Value Ref Range   Glucose-Capillary 310 (H) 70 - 99 mg/dL    Comment: Glucose reference range applies only to samples taken after fasting for at least 8 hours.  Troponin I (High Sensitivity)     Status: Abnormal   Collection Time: 04/05/23  9:50 AM  Result Value Ref Range   Troponin I (High Sensitivity) 32 (H) <18 ng/L    Comment: (NOTE) Elevated high sensitivity troponin I (hsTnI) values and significant  changes across serial measurements may suggest ACS but many other  chronic and acute conditions are known to elevate hsTnI results.  Refer to the "Links" section for chest pain algorithms and additional  guidance. Performed at [facility], [address], [place], [place] [address]    *Note: Due to a large number of results and/or encounters for the requested time period, some results have not been  displayed. A complete set of results can be found in Results Review.   CT Angio Chest PE W and/or Wo Contrast Result Date: 04/05/2023 CLINICAL DATA:  Pulmonary embolism (PE) suspected, low to intermediate prob, positive D-dimer. Shortness of breath, back pain. Chest pain. EXAM: CT ANGIOGRAPHY CHEST WITH CONTRAST TECHNIQUE: Multidetector CT imaging of the chest was performed using the standard protocol during bolus administration of intravenous contrast. Multiplanar CT image reconstructions and MIPs were obtained to evaluate the vascular anatomy. RADIATION DOSE REDUCTION: This exam was performed according to the departmental dose-optimization program which includes automated exposure control, adjustment of the mA and/or kV according to patient size and/or use of iterative reconstruction technique. CONTRAST:  65mL OMNIPAQUE IOHEXOL 350 MG/ML SOLN COMPARISON:  03/25/2023 FINDINGS: Cardiovascular: Cardiomegaly. Densely calcified mitral valve. Prior aortic valve repair. Extensive coronary artery and aortic atherosclerosis. No aneurysm. No filling defects in the pulmonary arteries to suggest pulmonary emboli. Mediastinum/Nodes: No mediastinal, hilar, or axillary adenopathy. Trachea and esophagus are unremarkable. Thyroid unremarkable Lungs/Pleura: Vascular congestion. Bibasilar airspace opacities, favor atelectasis although infiltrates cannot be completely excluded. Trace left pleural effusion. Upper Abdomen: No acute findings Musculoskeletal: Chest wall soft tissues are unremarkable. No acute bony abnormality. Review of the MIP images confirms the above findings. IMPRESSION: No evidence of pulmonary embolus. Cardiomegaly, vascular congestion. Coronary artery disease. Ground-glass opacities in the lower lobes/basis, favor atelectasis although infiltrate/pneumonia cannot be completely excluded. Trace left pleural effusion. Aortic Atherosclerosis (ICD10-I70.0). Electronically Signed   By: Charlett Nose M.D.   On:  04/05/2023 02:18   CT Head Wo Contrast Result Date: 04/05/2023 CLINICAL DATA:  Neuro deficit, acute,  stroke suspected. Shortness of breath, back pain EXAM: CT HEAD WITHOUT CONTRAST TECHNIQUE: Contiguous axial images were obtained from the base of the skull through the vertex without intravenous contrast. RADIATION DOSE REDUCTION: This exam was performed according to the departmental dose-optimization program which includes automated exposure control, adjustment of the mA and/or kV according to patient size and/or use of iterative reconstruction technique. COMPARISON:  03/25/2023 FINDINGS: Brain: Diffuse cerebral atrophy. No acute intracranial abnormality. Specifically, no hemorrhage, hydrocephalus, mass lesion, acute infarction, or significant intracranial injury. Vascular: No hyperdense vessel or unexpected calcification. Skull: No acute calvarial abnormality. Sinuses/Orbits: No acute findings Other: None IMPRESSION: No acute intracranial abnormality. Electronically Signed   By: Charlett Nose M.D.   On: 04/05/2023 02:15   DG Lumbar Spine Complete Result Date: 04/05/2023 CLINICAL DATA:  Low back pain, no known injury, initial encounter EXAM: LUMBAR SPINE - COMPLETE 4+ VIEW COMPARISON:  None Available. FINDINGS: Five lumbar type vertebral bodies are well visualized. Vertebral body height is well maintained. Osteophytic changes are noted. Facet hypertrophic changes are seen. No anterolisthesis is noted. IMPRESSION: Degenerative changes of the lumbar spine are noted without acute abnormality. Electronically Signed   By: Alcide Clever M.D.   On: 04/05/2023 00:51   DG Thoracic Spine 4V Result Date: 04/05/2023 CLINICAL DATA:  Upper back pain, initial encounter EXAM: THORACIC SPINE - 4+ VIEW COMPARISON:  None Available. FINDINGS: Vertebral body height is well maintained. No anterolisthesis is noted. Mild osteophytic changes are seen. Changes of prior TAVR are noted. IMPRESSION: Degenerative change without acute  abnormality. Electronically Signed   By: Alcide Clever M.D.   On: 04/05/2023 00:49   DG Chest 2 View Result Date: 04/04/2023 CLINICAL DATA:  Shortness of breath EXAM: CHEST - 2 VIEW COMPARISON:  03/24/2023 FINDINGS: Heart is borderline in size. Mediastinal contours within normal limits. Aortic atherosclerosis. No overt edema or effusions. No acute bony abnormality. IMPRESSION: No active cardiopulmonary disease. Electronically Signed   By: Charlett Nose M.D.   On: 04/04/2023 21:36    Pending Labs Unresulted Labs (From admission, onward)     Start     Ordered   04/06/23 0500  Basic metabolic panel  Tomorrow morning,   R        04/05/23 0804   04/06/23 0500  CBC  Daily,   R      04/05/23 1023            Vitals/Pain Today's Vitals   04/05/23 0600 04/05/23 0833 04/05/23 0834 04/05/23 0900  BP: (!) 141/74   125/63  Pulse: 81   82  Resp: 16   14  Temp:  98 F (36.7 C) 98 F (36.7 C)   TempSrc:  Oral Oral   SpO2: 96%   98%  Weight:      Height:      PainSc:        Isolation Precautions No active isolations  Medications Medications  acetaminophen (TYLENOL) tablet 650 mg (has no administration in time range)    Or  acetaminophen (TYLENOL) suppository 650 mg (has no administration in time range)  melatonin tablet 3 mg (has no administration in time range)  ondansetron (ZOFRAN) injection 4 mg (has no administration in time range)  allopurinol (ZYLOPRIM) tablet 100 mg (100 mg Oral Given 04/05/23 0930)  colchicine tablet 0.6 mg (0.6 mg Oral Given 04/05/23 0929)  diltiazem (CARDIZEM CD) 24 hr capsule 120 mg (120 mg Oral Given 04/05/23 0930)  amitriptyline (ELAVIL) tablet 25 mg (has no administration in  time range)  insulin NPH Human (NOVOLIN N) injection 20 Units (20 Units Subcutaneous Given 04/05/23 1103)    And  insulin NPH Human (NOVOLIN N) injection 14 Units (has no administration in time range)  levothyroxine (SYNTHROID) tablet 137 mcg (137 mcg Oral Given 04/05/23 0930)   metFORMIN (GLUCOPHAGE-XR) 24 hr tablet 500 mg (has no administration in time range)  pantoprazole (PROTONIX) EC tablet 40 mg (40 mg Oral Given 04/05/23 0930)  apixaban (ELIQUIS) tablet 5 mg (5 mg Oral Given 04/05/23 0929)  mirabegron ER (MYRBETRIQ) tablet 25 mg (25 mg Oral Given 04/05/23 0929)  tiZANidine (ZANAFLEX) tablet 4 mg (has no administration in time range)  potassium chloride SA (KLOR-CON M) CR tablet 20 mEq (has no administration in time range)  furosemide (LASIX) injection 40 mg (40 mg Intravenous Given 04/05/23 0928)  insulin aspart (novoLOG) injection 0-15 Units (11 Units Subcutaneous Given 04/05/23 0934)  insulin aspart (novoLOG) injection 0-5 Units (has no administration in time range)  atorvastatin (LIPITOR) tablet 20 mg (20 mg Oral Given 04/05/23 0929)  sodium chloride flush (NS) 0.9 % injection 3 mL (3 mLs Intravenous Given 04/05/23 0935)  nitroGLYCERIN (NITROSTAT) SL tablet 0.4 mg (has no administration in time range)  morphine (PF) 4 MG/ML injection 4 mg (4 mg Intravenous Given 04/04/23 2116)  furosemide (LASIX) injection 40 mg (40 mg Intravenous Given 04/04/23 2303)  ondansetron (ZOFRAN) injection 4 mg (4 mg Intravenous Given 04/04/23 2259)  HYDROmorphone (DILAUDID) injection 0.5 mg (0.5 mg Intravenous Given 04/04/23 2306)  dexamethasone (DECADRON) injection 10 mg (10 mg Intravenous Given 04/04/23 2301)  LORazepam (ATIVAN) injection 0.5 mg (0.5 mg Intravenous Given 04/05/23 0126)  ondansetron (ZOFRAN) injection 4 mg (4 mg Intravenous Given 04/05/23 0127)  iohexol (OMNIPAQUE) 350 MG/ML injection 65 mL (65 mLs Intravenous Contrast Given 04/05/23 0205)  furosemide (LASIX) injection 60 mg (60 mg Intravenous Given 04/05/23 0443)  potassium chloride SA (KLOR-CON M) CR tablet 40 mEq (40 mEq Oral Given 04/05/23 0435)    Mobility walks with person assist     Focused Assessments Pulmonary Assessment Handoff:  Lung sounds:   O2 Device: Nasal Cannula O2 Flow Rate (L/min):  2 L/min    R Recommendations: See Admitting Provider Note  Report given to:   Additional Notes:

## 2023-04-05 NOTE — ED Notes (Signed)
Husband to nurses station. States he checked pt blood sugar and reads 356.

## 2023-04-05 NOTE — Progress Notes (Addendum)
  Carryover admission to the Day Admitter.  I discussed this case with the EDP, Dr. Manus Gunning.  Per these discussions:   This is a 77 year old female with history of chronic diastolic heart failure, obstructive sleep apnea on home nocturnal CPAP, who is being admitted with acute on chronic diastolic heart failure complicated by acute hypoxic respiratory distress after presenting with 1 week of progressive shortness of breath.  She was reported to been instructed by her PCP and outpatient cardiologist to increase her outpatient dose of torsemide 40 mg p.o. daily, but she acknowledges she has not yet done this.  She was hospitalized approximately 2 weeks ago for chest pain rule out.   Most recent echocardiogram occurred in March 2024 and was notable for LVEF 66 5% as well as the presence of diastolic dysfunction.  Vital signs in the ED this evening were notable for initial oxygen saturation 88% on room air, subsequently improving into the mid 90s on 2 L nasal cannula, representing a new supplemental oxygen requirement for her.  Potassium level 3.7 with baseline renal function.  High-sensitivity troponin I initially 48, with repeat trending down to 42 BNP is slightly more than 400, representing greater than 2 fold increase relative to most recent prior BNP value when checked 5 months ago..  EKG reportedly showed no evidence of acute ischemic changes.  CTA chest showed evidence of pulmonary vascular congestion as well as groundglass opacities concerning for edema.  She received Lasix 40 mg IV x 1 dose this evening.  I have placed an order for inpatient admission for further evaluation management of the above.  I have placed some additional preliminary admit orders via the adult multi-morbid admission order set. I have also ordered an additional 60 mg of IV Lasix x 1 dose now given her outpatient dose of torsemide with associated outpatient instructions from her cardiologist to increase relative to this  dose.  Have also ordered potassium chloride 40 mill equivalents p.o. x 1 dose now and ordered morning labs in the form of CMP, CBC, magnesium and phosphorus levels.  I will defer additional diuretic orders to the admitting hospitalist.    Newton Pigg, DO Hospitalist

## 2023-04-05 NOTE — Hospital Course (Addendum)
Breanna Webster was admitted to the hospital with the working diagnosis of heart failure exacerbation.   77 yof w/ CHFpEF OSA onl CPAP, presented with 1 week of progressive shortness of breath.  She was reported to been instructed by her PCP and outpatient cardiologist to increase her outpatient dose of torsemide 40 mg p.o. daily, but she acknowledges she has not yet done this. Because of worsening symptoms EMS was called, she was found in distress and was transported to the ED. On her initial physical examination her blood pressure was 123/69, HR 88, RR 27 and 02 saturation 97%, lungs with rales bilaterally with no wheezing, heart with S1 and S2 present and regular with no gallops, rubs or murmurs, abdomen with no distention and no lower extremity edema.   Chest radiograph with hyperinflation, bilateral mild vascular hilar congestion, no effusions or infiltrates. Fluid in the right fissure per lateral film.   EKG 88 bpm, left axis deviation, left bundle branch block, qtc 546, sinus rhythm with no significant ST segment or T wave changes.   Patient was placed on furosemide for diuresis.   12/15 patient is responding well to diuresis.

## 2023-04-05 NOTE — Evaluation (Signed)
Occupational Therapy Evaluation Patient Details Name: Breanna Webster MRN: [id number] DOB: [birth year] Today's Date: 04/05/2023   History of Present Illness 77 y.o. female with medical history significant of history of atrial fibrillation with RVR, prior strep bacteremia, CAD with stent to RCA in 2019, carotid artery stenosis with history of acute cerebellar stroke in 2020 and an embolic right posterior cerebral artery stroke in 2022, HFpEF, diverticulosis with prior diverticulitis, GERD, Graves' disease with posttreatment hypothyroidism, total shoulder replacement on the left, dyslipidemia, hypertension, parkinsonian symptoms, status post TAVR, type 2 diabetes.  Patient adm 12/12 with reports of shortness of breath.  Acute on Chronic HF.   Clinical Impression   Patient presents with diagnosis above.  Difficult year: CVA, TSA, fall with clavicular fx, back pain, arthritic knees.  PTA her spouse assists with iADL, community mobility, recently having to assist more with ADL, and generally walks short household distances with a SPC.  SOB and back pain are the primary deficits.  Currently she is transferring with Min A at RW level, and needing up to Mod A for ADL completion.  OT will continue efforts in the acute setting to address deficits, and the patient would consider HH therapy if needed.        If plan is discharge home, recommend the following: Assist for transportation;Assistance with cooking/housework;A little help with walking and/or transfers;A little help with bathing/dressing/bathroom    Functional Status Assessment  Patient has had a recent decline in their functional status and demonstrates the ability to make significant improvements in function in a reasonable and predictable amount of time.  Equipment Recommendations  BSC/3in1    Recommendations for Other Services       Precautions / Restrictions Precautions Precautions: Fall Restrictions Weight Bearing Restrictions Per  Provider Order: No      Mobility Bed Mobility Overal bed mobility: Needs Assistance Bed Mobility: Supine to Sit     Supine to sit: Mod assist, HOB elevated          Transfers Overall transfer level: Needs assistance Equipment used: Rolling walker (2 wheels) Transfers: Sit to/from Stand, Bed to chair/wheelchair/BSC Sit to Stand: Min assist     Step pivot transfers: Min assist            Balance Overall balance assessment: Needs assistance Sitting-balance support: Feet supported Sitting balance-Leahy Scale: Fair     Standing balance support: Reliant on assistive device for balance Standing balance-Leahy Scale: Poor                             ADL either performed or assessed with clinical judgement   ADL Overall ADL's : Needs assistance/impaired Eating/Feeding: Set up;Sitting   Grooming: Set up;Sitting   Upper Body Bathing: Minimal assistance;Sitting   Lower Body Bathing: Moderate assistance;Sit to/from stand   Upper Body Dressing : Minimal assistance;Sitting   Lower Body Dressing: Moderate assistance;Sit to/from stand   Toilet Transfer: Minimal assistance;Rolling walker (2 wheels);Ambulation                   Vision Baseline Vision/History: 4 Cataracts Patient Visual Report: No change from baseline Additional Comments: Field cut to L visual field from prior CVA     Perception Perception: Within Functional Limits       Praxis Praxis: WFL       Pertinent Vitals/Pain Pain Assessment Pain Assessment: Faces Faces Pain Scale: Hurts little more Pain Location: Back Pain Descriptors / Indicators: Aching, Sharp,  Grimacing, Guarding     Extremity/Trunk Assessment Upper Extremity Assessment Upper Extremity Assessment: LUE deficits/detail LUE Deficits / Details: Limited AROM post TSA LUE: Shoulder pain at rest LUE Sensation: WNL LUE Coordination: WNL   Lower Extremity Assessment Lower Extremity Assessment: Defer to PT  evaluation   Cervical / Trunk Assessment Cervical / Trunk Assessment: Kyphotic   Communication Communication Communication: No apparent difficulties   Cognition Arousal: Alert Behavior During Therapy: WFL for tasks assessed/performed Overall Cognitive Status: Within Functional Limits for tasks assessed                                       General Comments   VSS on RA, HR to 89 with transfer.      Exercises     Shoulder Instructions      Home Living Family/patient expects to be discharged to:: Private residence Living Arrangements: Spouse/significant other Available Help at Discharge: Family;Available 24 hours/day Type of Home: House Home Access: Level entry     Home Layout: One level     Bathroom Shower/Tub: Producer, television/film/video: Standard     Home Equipment: Cane - single Librarian, academic (2 wheels);Shower seat          Prior Functioning/Environment Prior Level of Function : Independent/Modified Independent             Mobility Comments: used cane for ambulation, and uses a gait belt ADLs Comments: pt reports husband helps her with "everything"        OT Problem List: Decreased strength;Decreased range of motion;Decreased activity tolerance;Impaired balance (sitting and/or standing);Pain      OT Treatment/Interventions: Self-care/ADL training;Therapeutic activities;Therapeutic exercise;Balance training;DME and/or AE instruction;Energy conservation    OT Goals(Current goals can be found in the care plan section) Acute Rehab OT Goals Patient Stated Goal: Return home OT Goal Formulation: With patient Time For Goal Achievement: 04/19/23 Potential to Achieve Goals: Good ADL Goals Pt Will Perform Grooming: with modified independence;standing Pt Will Perform Upper Body Dressing: sitting;with set-up Pt Will Perform Lower Body Dressing: with min assist;sit to/from stand Pt Will Transfer to Toilet: with modified  independence;regular height toilet;ambulating Pt/caregiver will Perform Home Exercise Program: Increased strength;Both right and left upper extremity;With theraband;With Supervision;With written HEP provided  OT Frequency: Min 1X/week    Co-evaluation              AM-PAC OT "6 Clicks" Daily Activity     Outcome Measure Help from another person eating meals?: None Help from another person taking care of personal grooming?: None Help from another person toileting, which includes using toliet, bedpan, or urinal?: A Little Help from another person bathing (including washing, rinsing, drying)?: A Lot Help from another person to put on and taking off regular upper body clothing?: A Little Help from another person to put on and taking off regular lower body clothing?: A Lot 6 Click Score: 18   End of Session Equipment Utilized During Treatment: Rolling walker (2 wheels) Nurse Communication: Mobility status  Activity Tolerance: Patient tolerated treatment well Patient left: in chair;with call bell/phone within reach;with family/visitor present  OT Visit Diagnosis: Unsteadiness on feet (R26.81);Muscle weakness (generalized) (M62.81);History of falling (Z91.81);Low vision, both eyes (H54.2);Pain Pain - Right/Left:  (Back)                Time: 3244-0102 OT Time Calculation (min): 22 min Charges:  OT General Charges $OT Visit:  1 Visit OT Evaluation $OT Eval Moderate Complexity: 1 Mod  04/05/2023  RP, OTR/L  Acute Rehabilitation Services  Office:  [phone]   Suzanna Obey 04/05/2023, 4:21 PM

## 2023-04-05 NOTE — Consult Note (Signed)
Cardiology Consultation   Patient ID: Breanna Webster MRN: [id number]; DOB: 1945-12-20  Admit date: 04/04/2023 Date of Consult: 04/05/2023  PCP:  Breanna Frees, NP   [facility] Providers Cardiologist:  Parke Poisson, MD     Patient Profile:   Breanna Webster is a 77 y.o. female with a hx of hypertension, hyperlipidemia, HFpEF, CVA, Graves' disease, diabetes, CAD status post PCI with DES x 1 to mid RCA, mitral valve endocarditis 6/20, paroxysmal atrial fibrillation, iron deficiency anemia, severe aortic stenosis status post TAVR and pulmonary hypertension who is being seen 04/05/2023 for the evaluation of CHF at the request of Dr Jerral Ralph.  History of Present Illness:   Breanna Webster is a 77 yo female with PMH noted above. Previously followed by Dr. Katrinka Blazing and Dr. Shari Prows.  Underwent cardiac catheterization 01/2018 which showed 70 to 80% mid RCA stenosis, widely patent LAD, left main and circumflex.  Moderately severe pulmonary hypertension with PA pressure systolic pressure of 66 mmHg and PA mean pressure of 41 mmHg, wedge pressure of 18, mild to moderate aortic stenosis with calculated valve area of 1.21 cm based on peak to peak gradient of 21 mmHg and mean gradient of . underwent staged intervention with successful PCI/DES to RCA 03/2018 with recommendations for aspirin/Plavix for at least 6 months.  Echocardiogram 06/2022 with LVEF of 60 to 65%, no regional wall motion abnormality, RV not well-visualized, moderate left atrial enlargement, mild MR, normal function of aortic valve prosthesis with mean gradient of 16.3 mmHg.  She was seen in the office in March for dizziness and felt that her blood pressures were contributing.  She was noted to have some lower extremity edema however recent lower extremity Dopplers were negative for DVT.  Seen again 1 month later and was frustrated about the amount of times that she was having to go to the bathroom to urinate while  being on spironolactone though her legs were less swollen.  She requested to stop spironolactone at that time.   Presented back to the clinic 08/2022 with worsening lower extremity edema.  Blood pressure was noted to be 104/60.  EKG showing sinus rhythm.  She was instructed to increase her torsemide to 60 mg x 3 days then resume normal dosing.  Seen back in follow-up and reported her weight had unchanged therefore metolazone 2.5 mg x 2 days was added.  Her torsemide was also increased from 30 mg to 40 mg daily.  Last seen in the clinic 02/06/2023 with again complaints of worsening lower extremity edema as well as left arm swelling.  It was noted that her weight remained stable.  It was recommended she undergo updated outpatient echocardiogram.  She called in to the office reporting worsening shortness of breath and lower extremity edema on 12/11.  She was instructed to double her torsemide and potassium supplement x 3 days but stated that she was unable to do this as she had several appointments upcoming.  Presented to the ER 12/12 with worsening dyspnea and worsening lower back pain. Says over the past couple of days her pain had worsened and felt her legs were more swollen. She did not increase her torsemide dosing.  Labs in the ED showed sodium 143, potassium 3.9, creatinine 0.69, high-sensitivity troponin 48>> 42, WBC 5.6, hemoglobin 9.7, BNP 401.  Chest x-ray with no acute finding.  CT angio chest with vascular congestion, groundglass opacities in the lower lobes concerning for atelectasis although unable to exclude pneumonia/infiltrate.  She was  admitted to internal medicine for further management.  Cardiology asked to evaluate.  Past Medical History:  Diagnosis Date   Anxiety    Arthritis    "back" (04/22/2018)   Atrial fibrillation with RVR (HCC) 10/21/2018   Back pain    Bacteremia due to Streptococcus Salivarius 10/07/2018   Blood transfusion without reported diagnosis    CAD (coronary  artery disease)    a. 03/2018 s/p PCI/DES to the RCA (3.0x15 Onyx DES).   Carotid artery stenosis    Mild   Cerebellar stroke, acute (HCC) 10/21/2018   Cerebrovascular accident (CVA) due to embolism of right posterior cerebral artery (HCC) 08/03/2020   Chest pain    CHF (congestive heart failure) (HCC)    Chronic lower back pain    Cirrhosis (HCC)    Colon polyps    Diverticulitis    Diverticulosis    Esophageal thickening    seen on pre TAVR CT scan, also questionable cirrhosis. MRI recommended. Will refer to GI   GERD (gastroesophageal reflux disease)    Gout    Grave's disease    H/O total shoulder replacement, left 06/01/2022   Heart murmur    History of cardioembolic stroke 03/29/2020   History of colonic polyps 05/22/2017   History of hiatal hernia    Hyperlipidemia    Hypertension    Hypothyroidism    IBS (irritable bowel syndrome)    Osteopenia    Parkinson's syndrome (HCC) 04/23/2020   Pulmonary nodules    seen on pre TAVR CT. likley benign. no follow up recommended if pt low risk.   S/P TAVR (transcatheter aortic valve replacement)    Shortness of breath on exertion    Stroke (HCC) 2021   x2   Thalassemia minor    Thyroid disease    Type II diabetes mellitus (HCC)     Past Surgical History:  Procedure Laterality Date   87 HOUR PH STUDY N/A 03/03/2018   Procedure: 24 HOUR PH STUDY;  Surgeon: Napoleon Form, MD;  Location: [facility] ENDOSCOPY;  Service: Endoscopy;  Laterality: N/A;   COLONOSCOPY  2023   COLONOSCOPY W/ BIOPSIES AND POLYPECTOMY     CORONARY ANGIOGRAPHY Right 04/21/2018   Procedure: CORONARY ANGIOGRAPHY (CATH LAB);  Surgeon: Lyn Records, MD;  Location: [facility] INVASIVE CV LAB;  Service: Cardiovascular;  Laterality: Right;   CORONARY STENT INTERVENTION N/A 04/22/2018   Procedure: CORONARY STENT INTERVENTION;  Surgeon: Lyn Records, MD;  Location: [facility] INVASIVE CV LAB;  Service: Cardiovascular;  Laterality: N/A;   DILATION AND CURETTAGE OF UTERUS      ESOPHAGEAL MANOMETRY N/A 03/03/2018   Procedure: ESOPHAGEAL MANOMETRY (EM);  Surgeon: Napoleon Form, MD;  Location: [facility] ENDOSCOPY;  Service: Endoscopy;  Laterality: N/A;   HYSTEROSCOPY     fibroids   LAPAROSCOPIC CHOLECYSTECTOMY  1985   LAPAROSCOPY     fibroids   NISSEN FUNDOPLICATION  1990s   POLYPECTOMY     REVERSE SHOULDER ARTHROPLASTY Left 06/01/2022   Procedure: REVERSE SHOULDER ARTHROPLASTY;  Surgeon: Beverely Low, MD;  Location: [facility] ORS;  Service: Orthopedics;  Laterality: Left;  choice with interscalene block   RIGHT/LEFT HEART CATH AND CORONARY ANGIOGRAPHY N/A 02/20/2018   Procedure: RIGHT/LEFT HEART CATH AND CORONARY ANGIOGRAPHY;  Surgeon: Lyn Records, MD;  Location: [facility] INVASIVE CV LAB;  Service: Cardiovascular;  Laterality: N/A;   TEE WITHOUT CARDIOVERSION N/A 07/08/2018   Procedure: TRANSESOPHAGEAL ECHOCARDIOGRAM (TEE);  Surgeon: Kathleene Hazel, MD;  Location: [facility] OR;  Service: Open Heart  Surgery;  Laterality: N/A;   TEE WITHOUT CARDIOVERSION  10/07/2018   TEE WITHOUT CARDIOVERSION N/A 10/07/2018   Procedure: TRANSESOPHAGEAL ECHOCARDIOGRAM (TEE);  Surgeon: Jake Bathe, MD;  Location[facility] ENDOSCOPY;  Service: Cardiovascular;  Laterality: N/A;   TONSILLECTOMY     age 77   TRANSCATHETER AORTIC VALVE REPLACEMENT, TRANSFEMORAL N/A 07/08/2018   Procedure: TRANSCATHETER AORTIC VALVE REPLACEMENT, TRANSFEMORAL;  Surgeon: Kathleene Hazel, MD;  Location: [facility] OR;  Service: Open Heart Surgery;  Laterality: N/A;     Inpatient Medications: Scheduled Meds:  allopurinol  100 mg Oral Daily   amitriptyline  25 mg Oral QHS   apixaban  5 mg Oral BID   atorvastatin  20 mg Oral Daily   colchicine  0.6 mg Oral Daily   diltiazem  120 mg Oral Daily   docusate sodium  100 mg Oral BID   furosemide  40 mg Intravenous Q12H   insulin aspart  0-15 Units Subcutaneous TID WC   insulin aspart  0-5 Units Subcutaneous QHS   insulin NPH Human  20 Units Subcutaneous QAC breakfast    And   insulin NPH Human  14 Units Subcutaneous QHS   levothyroxine  137 mcg Oral q morning   [START ON 04/06/2023] metFORMIN  500 mg Oral BID AC   mirabegron ER  25 mg Oral Daily   pantoprazole  40 mg Oral Daily   [START ON 04/06/2023] potassium chloride SA  20 mEq Oral Daily   sodium chloride flush  3 mL Intravenous Q12H   Continuous Infusions:  PRN Meds: acetaminophen **OR** acetaminophen, melatonin, nitroGLYCERIN, ondansetron (ZOFRAN) IV, oxyCODONE, tiZANidine  Allergies:   No Known Allergies  Social History:   Social History   Socioeconomic History   Marital status: Married    Spouse name: Not on file   Number of children: 2   Years of education: Not on file   Highest education level: Associate degree: academic program  Occupational History   Occupation: Health and safety inspector of the [employer]  Tobacco Use   Smoking status: Never    Passive exposure: Never   Smokeless tobacco: Never  Vaping Use   Vaping status: Never Used  Substance and Sexual Activity   Alcohol use: No    Alcohol/week: 0.0 standard drinks of alcohol   Drug use: Never   Sexual activity: Not Currently  Other Topics Concern   Not on file  Social History Narrative   Lives with husband in a one story home.     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   Education: college.   Social Drivers of Health   Financial Resource Strain: Patient Declined (12/09/2022)   Overall Financial Resource Strain (CARDIA)    Difficulty of Paying Living Expenses: Patient declined  Food Insecurity: No Food Insecurity (04/05/2023)   Hunger Vital Sign    Worried About Running Out of Food in the Last Year: Never true    Ran Out of Food in the Last Year: Never true  Transportation Needs: No Transportation Needs (04/05/2023)   PRAPARE - Administrator, Civil Service (Medical): No    Lack of Transportation (Non-Medical): No  Physical  Activity: Unknown (12/09/2022)   Exercise Vital Sign    Days of Exercise per Week: Patient declined    Minutes of Exercise per Session: 40 min  Stress: No Stress Concern Present (12/09/2022)   [employer] of Occupational Health - Occupational Stress Questionnaire  Feeling of Stress : Only a little  Social Connections: Unknown (12/09/2022)   Social Connection and Isolation Panel [NHANES]    Frequency of Communication with Friends and Family: More than three times a week    Frequency of Social Gatherings with Friends and Family: Patient declined    Attends Religious Services: Patient declined    Database administrator or Organizations: Patient declined    Attends Engineer, structural: More than 4 times per year    Marital Status: Married  Catering manager Violence: Not At Risk (04/05/2023)   Humiliation, Afraid, Rape, and Kick questionnaire    Fear of Current or Ex-Partner: No    Emotionally Abused: No    Physically Abused: No    Sexually Abused: No    Family History:    Family History  Adopted: Yes  Problem Relation Age of Onset   Healthy Son        x 2   Headache Other        Cluster headaches   Heart failure Mother    Colon cancer Neg Hx    Pancreatic cancer Neg Hx    Rectal cancer Neg Hx    Stomach cancer Neg Hx      ROS:  Please see the history of present illness.   All other ROS reviewed and negative.     Physical Exam/Data:   Vitals:   04/05/23 0833 04/05/23 0834 04/05/23 0900 04/05/23 1215  BP:   125/63 (!) 145/73  Pulse:   82 81  Resp:   14 17  Temp: 98 F (36.7 C) 98 F (36.7 C)  98.3 F (36.8 C)  TempSrc: Oral Oral  Oral  SpO2:   98% 99%  Weight:      Height:        Intake/Output Summary (Last 24 hours) at 04/05/2023 1257 Last data filed at 04/05/2023 1001 Gross per 24 hour  Intake --  Output 1125 ml  Net -1125 ml      04/04/2023    8:39 PM 04/04/2023    1:50 PM 03/24/2023   10:45 PM  Last 3 Weights  Weight (lbs) 176 lb --  174 lb 2.6 oz  Weight (kg) 79.833 kg -- 79 kg     Body mass index is 29.29 kg/m.  General:  Well nourished, well developed, in no acute distress HEENT: normal Neck: no JVD Vascular: No carotid bruits; Distal pulses 2+ bilaterally Cardiac:  normal S1, S2; RRR; + soft systolic murmur  Lungs:  diminished in bases Abd: soft, nontender, no hepatomegaly  Ext: 1+ bilateral LE edema Musculoskeletal:  No deformities, BUE and BLE strength normal and equal Skin: warm and dry  Neuro:  CNs 2-12 intact, no focal abnormalities noted Psych:  Normal affect   EKG:  The EKG was personally reviewed and demonstrates:  Sinus Rhythm, LBBB 88bpm Telemetry:  Telemetry was personally reviewed and demonstrates:  Sinus Tach-NSR with LBBB, freq PVCs  Relevant CV Studies:  Echo: 06/2022  IMPRESSIONS     1. Left ventricular ejection fraction, by estimation, is 60 to 65%. The  left ventricle has normal function. The left ventricle has no regional  wall motion abnormalities. Diastolic function is indeterminant due to  severe MAC.   2. Right ventricular systolic function was not well visualized. The right  ventricular size is not well visualized.   3. Left atrial size was moderately dilated.   4. The mitral valve is degenerative. Trivial mitral valve regurgitation.  Mild calcific  mitral stenosis with mean gradient of at HR 68bpm.  Severe mitral annular calcification.   5. The aortic valve has been repaired/replaced. Aortic valve  regurgitation is not visualized. There is a 23 mm Sapien prosthetic (TAVR)  valve present in the aortic position. Procedure Date: 07/08/18. Echo  findings are consistent with normal structure and   function of the aortic valve prosthesis. Aortic valve mean gradient  measures 16.3 mmHg. Aortic valve Vmax measures 2.64 m/s.   6. The inferior vena cava is normal in size with greater than 50%  respiratory variability, suggesting right atrial pressure of 3 mmHg.   Comparison(s):  No significant change from prior study.   FINDINGS   Left Ventricle: Left ventricular ejection fraction, by estimation, is 60  to 65%. The left ventricle has normal function. The left ventricle has no  regional wall motion abnormalities. The left ventricular internal cavity  size was normal in size. There is   no left ventricular hypertrophy. Diastolic function is indeterminant due  to severe MAC.   Right Ventricle: The right ventricular size is not well visualized. Right  vetricular wall thickness was not well visualized. Right ventricular  systolic function was not well visualized.   Left Atrium: Left atrial size was moderately dilated.   Right Atrium: Right atrial size was not well visualized.   Pericardium: There is no evidence of pericardial effusion.   Mitral Valve: The mitral valve is degenerative in appearance. Severe  mitral annular calcification. Trivial mitral valve regurgitation. Mild  mitral valve stenosis. MV peak gradient, 14.4 mmHg. The mean mitral valve  gradient is 6.0 mmHg.   Tricuspid Valve: The tricuspid valve is normal in structure. Tricuspid  valve regurgitation is trivial.   Aortic Valve: The aortic valve has been repaired/replaced. Aortic valve  regurgitation is not visualized. Aortic valve mean gradient measures 16.3  mmHg. Aortic valve peak gradient measures 27.8 mmHg. Aortic valve area, by  VTI measures 1.68 cm. There is a   23 mm Sapien prosthetic, stented (TAVR) valve present in the aortic  position. Procedure Date: 07/08/18. Echo findings are consistent with  normal structure and function of the aortic valve prosthesis.   Pulmonic Valve: The pulmonic valve was not well visualized.   Aorta: The aortic root was not well visualized.   Venous: The inferior vena cava is normal in size with greater than 50%  respiratory variability, suggesting right atrial pressure of 3 mmHg.   IAS/Shunts: The atrial septum is grossly normal.   Laboratory  Data:  High Sensitivity Troponin:   Recent Labs  Lab 03/24/23 2258 03/25/23 0114 04/05/23 0041 04/05/23 0222 04/05/23 0950  TROPONINIHS 28* 26* 48* 42* 32*     Chemistry Recent Labs  Lab 04/04/23 1457 04/04/23 2114 04/05/23 0241 04/05/23 0442  NA 143 142 137 137  K 3.9 3.7 3.7 4.2  CL 111 113*  --  107  CO2 22 17*  --  18*  GLUCOSE 113* 123*  --  244*  BUN 16 15  --  17  CREATININE 0.69 0.68  --  0.86  CALCIUM 10.1 10.1  --  9.7  MG  --   --   --  1.7  GFRNONAA  --  >60  --  >60  ANIONGAP  --  12  --  12    Recent Labs  Lab 04/04/23 1457 04/04/23 2114 04/05/23 0442  PROT 7.0 6.6 7.0  ALBUMIN 4.1 3.7 3.9  AST 23 29 35  ALT 19 22 23  ALKPHOS 80 72 78  BILITOT 0.7 0.8 0.8   Lipids No results for input(s): "CHOL", "TRIG", "HDL", "LABVLDL", "LDLCALC", "CHOLHDL" in the last 168 hours.  Hematology Recent Labs  Lab 04/04/23 1457 04/04/23 2114 04/05/23 0241 04/05/23 0442  WBC 5.4 5.6  --  4.9  RBC 5.07 5.11  --  5.33*  HGB 9.9* 9.7* 10.5* 10.1*  HCT 31.2* 32.0* 31.0* 32.6*  MCV 61.5 Repeated and verified X2.* 62.6*  --  61.2*  MCH  --  19.0*  --  18.9*  MCHC 31.8 30.3  --  31.0  RDW 16.9* 16.8*  --  16.9*  PLT 175.0 150  --  150   Thyroid No results for input(s): "TSH", "FREET4" in the last 168 hours.  BNP Recent Labs  Lab 04/04/23 2114  BNP 401.3*    DDimer No results for input(s): "DDIMER" in the last 168 hours.   Radiology/Studies:  CT Angio Chest PE W and/or Wo Contrast Result Date: 04/05/2023 CLINICAL DATA:  Pulmonary embolism (PE) suspected, low to intermediate prob, positive D-dimer. Shortness of breath, back pain. Chest pain. EXAM: CT ANGIOGRAPHY CHEST WITH CONTRAST TECHNIQUE: Multidetector CT imaging of the chest was performed using the standard protocol during bolus administration of intravenous contrast. Multiplanar CT image reconstructions and MIPs were obtained to evaluate the vascular anatomy. RADIATION DOSE REDUCTION: This exam was  performed according to the departmental dose-optimization program which includes automated exposure control, adjustment of the mA and/or kV according to patient size and/or use of iterative reconstruction technique. CONTRAST:  65mL OMNIPAQUE IOHEXOL 350 MG/ML SOLN COMPARISON:  03/25/2023 FINDINGS: Cardiovascular: Cardiomegaly. Densely calcified mitral valve. Prior aortic valve repair. Extensive coronary artery and aortic atherosclerosis. No aneurysm. No filling defects in the pulmonary arteries to suggest pulmonary emboli. Mediastinum/Nodes: No mediastinal, hilar, or axillary adenopathy. Trachea and esophagus are unremarkable. Thyroid unremarkable Lungs/Pleura: Vascular congestion. Bibasilar airspace opacities, favor atelectasis although infiltrates cannot be completely excluded. Trace left pleural effusion. Upper Abdomen: No acute findings Musculoskeletal: Chest wall soft tissues are unremarkable. No acute bony abnormality. Review of the MIP images confirms the above findings. IMPRESSION: No evidence of pulmonary embolus. Cardiomegaly, vascular congestion. Coronary artery disease. Ground-glass opacities in the lower lobes/basis, favor atelectasis although infiltrate/pneumonia cannot be completely excluded. Trace left pleural effusion. Aortic Atherosclerosis (ICD10-I70.0). Electronically Signed   By: Charlett Nose M.D.   On: 04/05/2023 02:18   CT Head Wo Contrast Result Date: 04/05/2023 CLINICAL DATA:  Neuro deficit, acute, stroke suspected. Shortness of breath, back pain EXAM: CT HEAD WITHOUT CONTRAST TECHNIQUE: Contiguous axial images were obtained from the base of the skull through the vertex without intravenous contrast. RADIATION DOSE REDUCTION: This exam was performed according to the departmental dose-optimization program which includes automated exposure control, adjustment of the mA and/or kV according to patient size and/or use of iterative reconstruction technique. COMPARISON:  03/25/2023 FINDINGS:  Brain: Diffuse cerebral atrophy. No acute intracranial abnormality. Specifically, no hemorrhage, hydrocephalus, mass lesion, acute infarction, or significant intracranial injury. Vascular: No hyperdense vessel or unexpected calcification. Skull: No acute calvarial abnormality. Sinuses/Orbits: No acute findings Other: None IMPRESSION: No acute intracranial abnormality. Electronically Signed   By: Charlett Nose M.D.   On: 04/05/2023 02:15   DG Lumbar Spine Complete Result Date: 04/05/2023 CLINICAL DATA:  Low back pain, no known injury, initial encounter EXAM: LUMBAR SPINE - COMPLETE 4+ VIEW COMPARISON:  None Available. FINDINGS: Five lumbar type vertebral bodies are well visualized. Vertebral body height is well maintained. Osteophytic changes are noted. Facet hypertrophic  changes are seen. No anterolisthesis is noted. IMPRESSION: Degenerative changes of the lumbar spine are noted without acute abnormality. Electronically Signed   By: Alcide Clever M.D.   On: 04/05/2023 00:51   DG Thoracic Spine 4V Result Date: 04/05/2023 CLINICAL DATA:  Upper back pain, initial encounter EXAM: THORACIC SPINE - 4+ VIEW COMPARISON:  None Available. FINDINGS: Vertebral body height is well maintained. No anterolisthesis is noted. Mild osteophytic changes are seen. Changes of prior TAVR are noted. IMPRESSION: Degenerative change without acute abnormality. Electronically Signed   By: Alcide Clever M.D.   On: 04/05/2023 00:49   DG Chest 2 View Result Date: 04/04/2023 CLINICAL DATA:  Shortness of breath EXAM: CHEST - 2 VIEW COMPARISON:  03/24/2023 FINDINGS: Heart is borderline in size. Mediastinal contours within normal limits. Aortic atherosclerosis. No overt edema or effusions. No acute bony abnormality. IMPRESSION: No active cardiopulmonary disease. Electronically Signed   By: Charlett Nose M.D.   On: 04/04/2023 21:36     Assessment and Plan:   Breanna Webster is a 77 y.o. female with a hx of hypertension, hyperlipidemia,  HFpEF, CVA, Graves' disease, diabetes, CAD status post PCI with DES x 1 to mid RCA, mitral valve endocarditis, paroxysmal atrial fibrillation, iron deficiency anemia, severe aortic stenosis status post TAVR and pulmonary hypertension who is being seen 04/05/2023 for the evaluation of CHF at the request of Dr Jerral Ralph.  Acute on Chronic HFpEF -- presented with worsening shortness of breath, LE edema for the past several days. She has had many office visits over the past year for issues with volume overload. Her weight last office visit 178lbs, weight in the ED 176lbs -- CT chest with ground-glass opacities, traced pleural effusion -- BNP 401 -- has been started on IV lasix 40mg  BID, net - 1.1L thus far -- would benefit from SGLT2i given her recurrent CHF episodes. Will start jardiance (cost check) -- also consider screening for amyloid as an outpatient  -- echo pending  Back pain -- this was one her main complaints on admission, xray with degenerative changes but no acute findings -- management per primary  CAD s/p RCA PCI -- denies any chest pain, low flat troponin -- on statin, has not been on ASA with need for [facility]  Paroxysmal atrial fibrillation -- currently in SR -- on Eliquis, Diltiazem   Aortic Stenosis s/p TAVR -- echo 06/2022 with stable valve function -- repeat echo pending  Risk Assessment/Risk Scores:   New York Heart Association (NYHA) Functional Class NYHA Class I  CHA2DS2-VASc Score = 9   This indicates a 12.2% annual risk of stroke. The patient's score is based upon: CHF History: 1 HTN History: 1 Diabetes History: 1 Stroke History: 2 Vascular Disease History: 1 Age Score: 2 Gender Score: 1    For questions or updates, please contact [facility] Please consult [email or web address] for contact info under    Signed, Laverda Page, NP  04/05/2023 12:57 PM

## 2023-04-05 NOTE — ED Notes (Signed)
ED TO INPATIENT HANDOFF REPORT  ED Nurse Name and Phone #:  [phone]  S Name/Age/Gender Breanna Webster 77 y.o. female Room/Bed: 045C/045C  Code Status   Code Status: Full Code  Home/SNF/Other Home Patient oriented to: self, place, time, and situation Is this baseline? Yes   Triage Complete: Triage complete  Chief Complaint Acute on chronic diastolic heart failure (HCC) [I50.33]  Triage Note BIB EMS - Sudden onset of SOB, back pain, lower neck pain, nausea, anxiety, and impending doom. Pain under breasts. EMS states gradual increase in elevation on EKG. 8/10 pain on arrival.   Allergies No Known Allergies  Level of Care/Admitting Diagnosis ED Disposition     ED Disposition  Admit   Condition  --   Comment  Hospital Area: [facility] [[id number]]  Level of Care: Telemetry Cardiac [[id number]]  May admit patient to [facility] or [facility] if equivalent level of care is available:: No  Covid Evaluation: Asymptomatic - no recent exposure (last 10 days) testing not required  Diagnosis: Acute on chronic diastolic heart failure (HCC) [428.33.ICD-9-CM]  Admitting Physician: Angie Fava [[id number]]  Attending Physician: Angie Fava [[id number]]  Certification:: I certify this patient will need inpatient services for at least 2 midnights  Expected Medical Readiness: 04/07/2023          B Medical/Surgery History Past Medical History:  Diagnosis Date   Anxiety    Arthritis    "back" (04/22/2018)   Atrial fibrillation with RVR (HCC) 10/21/2018   Back pain    Bacteremia due to Streptococcus Salivarius 10/07/2018   Blood transfusion without reported diagnosis    CAD (coronary artery disease)    a. 03/2018 s/p PCI/DES to the RCA (3.0x15 Onyx DES).   Carotid artery stenosis    Mild   Cerebellar stroke, acute (HCC) 10/21/2018   Cerebrovascular accident (CVA) due to embolism of right posterior cerebral artery (HCC) 08/03/2020   Chest pain    CHF  (congestive heart failure) (HCC)    Chronic lower back pain    Cirrhosis (HCC)    Colon polyps    Diverticulitis    Diverticulosis    Esophageal thickening    seen on pre TAVR CT scan, also questionable cirrhosis. MRI recommended. Will refer to GI   GERD (gastroesophageal reflux disease)    Gout    Grave's disease    H/O total shoulder replacement, left 06/01/2022   Heart murmur    History of cardioembolic stroke 03/29/2020   History of colonic polyps 05/22/2017   History of hiatal hernia    Hyperlipidemia    Hypertension    Hypothyroidism    IBS (irritable bowel syndrome)    Osteopenia    Parkinson's syndrome (HCC) 04/23/2020   Pulmonary nodules    seen on pre TAVR CT. likley benign. no follow up recommended if pt low risk.   S/P TAVR (transcatheter aortic valve replacement)    Shortness of breath on exertion    Stroke (HCC) 2021   x2   Thalassemia minor    Thyroid disease    Type II diabetes mellitus (HCC)    Past Surgical History:  Procedure Laterality Date   68 HOUR PH STUDY N/A 03/03/2018   Procedure: 24 HOUR PH STUDY;  Surgeon: Napoleon Form, MD;  Location: [facility] ENDOSCOPY;  Service: Endoscopy;  Laterality: N/A;   COLONOSCOPY  2023   COLONOSCOPY W/ BIOPSIES AND POLYPECTOMY     CORONARY ANGIOGRAPHY Right 04/21/2018   Procedure: CORONARY ANGIOGRAPHY (  CATH LAB);  Surgeon: Lyn Records, MD;  Location: [facility] INVASIVE CV LAB;  Service: Cardiovascular;  Laterality: Right;   CORONARY STENT INTERVENTION N/A 04/22/2018   Procedure: CORONARY STENT INTERVENTION;  Surgeon: Lyn Records, MD;  Location: [facility] INVASIVE CV LAB;  Service: Cardiovascular;  Laterality: N/A;   DILATION AND CURETTAGE OF UTERUS     ESOPHAGEAL MANOMETRY N/A 03/03/2018   Procedure: ESOPHAGEAL MANOMETRY (EM);  Surgeon: Napoleon Form, MD;  Location: [facility] ENDOSCOPY;  Service: Endoscopy;  Laterality: N/A;   HYSTEROSCOPY     fibroids   LAPAROSCOPIC CHOLECYSTECTOMY  1985   LAPAROSCOPY     fibroids    NISSEN FUNDOPLICATION  1990s   POLYPECTOMY     REVERSE SHOULDER ARTHROPLASTY Left 06/01/2022   Procedure: REVERSE SHOULDER ARTHROPLASTY;  Surgeon: Beverely Low, MD;  Location: [facility] ORS;  Service: Orthopedics;  Laterality: Left;  choice with interscalene block   RIGHT/LEFT HEART CATH AND CORONARY ANGIOGRAPHY N/A 02/20/2018   Procedure: RIGHT/LEFT HEART CATH AND CORONARY ANGIOGRAPHY;  Surgeon: Lyn Records, MD;  Location: [facility] INVASIVE CV LAB;  Service: Cardiovascular;  Laterality: N/A;   TEE WITHOUT CARDIOVERSION N/A 07/08/2018   Procedure: TRANSESOPHAGEAL ECHOCARDIOGRAM (TEE);  Surgeon: Kathleene Hazel, MD;  Location: [facility] OR;  Service: Open Heart Surgery;  Laterality: N/A;   TEE WITHOUT CARDIOVERSION  10/07/2018   TEE WITHOUT CARDIOVERSION N/A 10/07/2018   Procedure: TRANSESOPHAGEAL ECHOCARDIOGRAM (TEE);  Surgeon: Jake Bathe, MD;  Location: [facility] ENDOSCOPY;  Service: Cardiovascular;  Laterality: N/A;   TONSILLECTOMY     age 7   TRANSCATHETER AORTIC VALVE REPLACEMENT, TRANSFEMORAL N/A 07/08/2018   Procedure: TRANSCATHETER AORTIC VALVE REPLACEMENT, TRANSFEMORAL;  Surgeon: Kathleene Hazel, MD;  Location: [facility] OR;  Service: Open Heart Surgery;  Laterality: N/A;     A IV Location/Drains/Wounds Patient Lines/Drains/Airways Status     Active Line/Drains/Airways     Name Placement date Placement time Site Days   Peripheral IV 04/04/23 20 G Right Hand 04/04/23  2034  Hand  1   Peripheral IV 04/05/23 20 G Left Antecubital 04/05/23  0107  Antecubital  less than 1            Intake/Output Last 24 hours No intake or output data in the 24 hours ending 04/05/23 0536  Labs/Imaging Results for orders placed or performed during the hospital encounter of 04/04/23 (from the past 48 hours)  Comprehensive metabolic panel     Status: Abnormal   Collection Time: 04/04/23  9:14 PM  Result Value Ref Range   Sodium 142 135 - 145 mmol/L   Potassium 3.7 3.5 - 5.1 mmol/L   Chloride 113 (H)  98 - 111 mmol/L   CO2 17 (L) 22 - 32 mmol/L   Glucose, Bld 123 (H) 70 - 99 mg/dL    Comment: Glucose reference range applies only to samples taken after fasting for at least 8 hours.   BUN 15 8 - 23 mg/dL   Creatinine, Ser 7.82 0.44 - 1.00 mg/dL   Calcium 95.6 8.9 - 21.3 mg/dL   Total Protein 6.6 6.5 - 8.1 g/dL   Albumin 3.7 3.5 - 5.0 g/dL   AST 29 15 - 41 U/L   ALT 22 0 - 44 U/L   Alkaline Phosphatase 72 38 - 126 U/L   Total Bilirubin 0.8 <1.2 mg/dL   GFR, Estimated >08 >65 mL/min    Comment: (NOTE) Calculated using the CKD-EPI Creatinine Equation (2021)    Anion gap 12 5 - 15  Comment: Performed at [facility], [address], [place], [place] [address]  CBC with Differential     Status: Abnormal   Collection Time: 04/04/23  9:14 PM  Result Value Ref Range   WBC 5.6 4.0 - 10.5 K/uL   RBC 5.11 3.87 - 5.11 MIL/uL   Hemoglobin 9.7 (L) 12.0 - 15.0 g/dL   HCT 60.4 (L) 54.0 - 98.1 %   MCV 62.6 (L) 80.0 - 100.0 fL   MCH 19.0 (L) 26.0 - 34.0 pg   MCHC 30.3 30.0 - 36.0 g/dL   RDW 19.1 (H) 47.8 - 29.5 %   Platelets 150 150 - 400 K/uL    Comment: REPEATED TO VERIFY   nRBC 0.0 0.0 - 0.2 %   Neutrophils Relative % 64 %   Neutro Abs 3.5 1.7 - 7.7 K/uL   Lymphocytes Relative 25 %   Lymphs Abs 1.4 0.7 - 4.0 K/uL   Monocytes Relative 8 %   Monocytes Absolute 0.5 0.1 - 1.0 K/uL   Eosinophils Relative 2 %   Eosinophils Absolute 0.1 0.0 - 0.5 K/uL   Basophils Relative 1 %   Basophils Absolute 0.1 0.0 - 0.1 K/uL   Immature Granulocytes 0 %   Abs Immature Granulocytes 0.02 0.00 - 0.07 K/uL    Comment: Performed at [facility], [address] [place], [place] [address]  Lipase, blood     Status: None   Collection Time: 04/04/23  9:14 PM  Result Value Ref Range   Lipase 19 11 - 51 U/L    Comment: Performed at [facility], [address] [place], [place] [address]  Brain natriuretic peptide     Status: Abnormal   Collection Time: 04/04/23  9:14 PM  Result Value  Ref Range   B Natriuretic Peptide 401.3 (H) 0.0 - 100.0 pg/mL    Comment: Performed at [facility], [address] [place], [place] [address]  Urinalysis, Routine w reflex microscopic -Urine, Clean Catch     Status: None   Collection Time: 04/05/23 12:01 AM  Result Value Ref Range   Color, Urine YELLOW YELLOW   APPearance CLEAR CLEAR   Specific Gravity, Urine 1.011 1.005 - 1.030   pH 5.0 5.0 - 8.0   Glucose, UA NEGATIVE NEGATIVE mg/dL   Hgb urine dipstick NEGATIVE NEGATIVE   Bilirubin Urine NEGATIVE NEGATIVE   Ketones, ur NEGATIVE NEGATIVE mg/dL   Protein, ur NEGATIVE NEGATIVE mg/dL   Nitrite NEGATIVE NEGATIVE   Leukocytes,Ua NEGATIVE NEGATIVE    Comment: Performed at [facility], [address], [place], [place] [address]  Troponin I (High Sensitivity)     Status: Abnormal   Collection Time: 04/05/23 12:41 AM  Result Value Ref Range   Troponin I (High Sensitivity) 48 (H) <18 ng/L    Comment: (NOTE) Elevated high sensitivity troponin I (hsTnI) values and significant  changes across serial measurements may suggest ACS but many other  chronic and acute conditions are known to elevate hsTnI results.  Refer to the "Links" section for chest pain algorithms and additional  guidance. Performed at [facility], [address] [place], [place] [address]   Troponin I (High Sensitivity)     Status: Abnormal   Collection Time: 04/05/23  2:22 AM  Result Value Ref Range   Troponin I (High Sensitivity) 42 (H) <18 ng/L    Comment: (NOTE) Elevated high sensitivity troponin I (hsTnI) values and significant  changes across  serial measurements may suggest ACS but many other  chronic and acute conditions are known to elevate hsTnI results.  Refer to the "Links" section for chest pain algorithms and additional  guidance. Performed at [facility], [address] [place], [place] [address]   I-Stat arterial blood gas, ED [facility] ED, [facility], [facility])     Status: Abnormal    Collection Time: 04/05/23  2:41 AM  Result Value Ref Range   pH, Arterial 7.369 7.35 - 7.45   pCO2 arterial 37.0 32 - 48 mmHg   pO2, Arterial 84 83 - 108 mmHg   Bicarbonate 21.4 20.0 - 28.0 mmol/L   TCO2 23 22 - 32 mmol/L   O2 Saturation 96 %   Acid-base deficit 4.0 (H) 0.0 - 2.0 mmol/L   Sodium 137 135 - 145 mmol/L   Potassium 3.7 3.5 - 5.1 mmol/L   Calcium, Ion 1.29 1.15 - 1.40 mmol/L   HCT 31.0 (L) 36.0 - 46.0 %   Hemoglobin 10.5 (L) 12.0 - 15.0 g/dL   Patient temperature 19.1 C    Collection site RADIAL, ALLEN'S TEST ACCEPTABLE    Drawn by RT    Sample type ARTERIAL   Comprehensive metabolic panel     Status: Abnormal   Collection Time: 04/05/23  4:42 AM  Result Value Ref Range   Sodium 137 135 - 145 mmol/L   Potassium 4.2 3.5 - 5.1 mmol/L   Chloride 107 98 - 111 mmol/L   CO2 18 (L) 22 - 32 mmol/L   Glucose, Bld 244 (H) 70 - 99 mg/dL    Comment: Glucose reference range applies only to samples taken after fasting for at least 8 hours.   BUN 17 8 - 23 mg/dL   Creatinine, Ser 4.78 0.44 - 1.00 mg/dL   Calcium 9.7 8.9 - 29.5 mg/dL   Total Protein 7.0 6.5 - 8.1 g/dL   Albumin 3.9 3.5 - 5.0 g/dL   AST 35 15 - 41 U/L   ALT 23 0 - 44 U/L   Alkaline Phosphatase 78 38 - 126 U/L   Total Bilirubin 0.8 <1.2 mg/dL   GFR, Estimated >62 >13 mL/min    Comment: (NOTE) Calculated using the CKD-EPI Creatinine Equation (2021)    Anion gap 12 5 - 15    Comment: Performed at [facility], [address], [place], [place] [address]  Magnesium     Status: None   Collection Time: 04/05/23  4:42 AM  Result Value Ref Range   Magnesium 1.7 1.7 - 2.4 mg/dL    Comment: Performed at [facility], [address], [place], [place] [address]  Phosphorus     Status: None   Collection Time: 04/05/23  4:42 AM  Result Value Ref Range   Phosphorus 3.9 2.5 - 4.6 mg/dL    Comment: Performed at [facility], [address], [place], [place] [address]   *Note: Due to a large number of  results and/or encounters for the requested time period, some results have not been displayed. A complete set of results can be found in Results Review.   CT Angio Chest PE W and/or Wo Contrast Result Date: 04/05/2023 CLINICAL DATA:  Pulmonary embolism (PE) suspected, low to intermediate prob, positive D-dimer. Shortness of breath, back pain. Chest pain. EXAM: CT ANGIOGRAPHY CHEST WITH CONTRAST TECHNIQUE: Multidetector CT imaging of the chest was performed using the standard protocol during bolus administration of intravenous contrast. Multiplanar CT image reconstructions and  MIPs were obtained to evaluate the vascular anatomy. RADIATION DOSE REDUCTION: This exam was performed according to the departmental dose-optimization program which includes automated exposure control, adjustment of the mA and/or kV according to patient size and/or use of iterative reconstruction technique. CONTRAST:  65mL OMNIPAQUE IOHEXOL 350 MG/ML SOLN COMPARISON:  03/25/2023 FINDINGS: Cardiovascular: Cardiomegaly. Densely calcified mitral valve. Prior aortic valve repair. Extensive coronary artery and aortic atherosclerosis. No aneurysm. No filling defects in the pulmonary arteries to suggest pulmonary emboli. Mediastinum/Nodes: No mediastinal, hilar, or axillary adenopathy. Trachea and esophagus are unremarkable. Thyroid unremarkable Lungs/Pleura: Vascular congestion. Bibasilar airspace opacities, favor atelectasis although infiltrates cannot be completely excluded. Trace left pleural effusion. Upper Abdomen: No acute findings Musculoskeletal: Chest wall soft tissues are unremarkable. No acute bony abnormality. Review of the MIP images confirms the above findings. IMPRESSION: No evidence of pulmonary embolus. Cardiomegaly, vascular congestion. Coronary artery disease. Ground-glass opacities in the lower lobes/basis, favor atelectasis although infiltrate/pneumonia cannot be completely excluded. Trace left pleural effusion. Aortic  Atherosclerosis (ICD10-I70.0). Electronically Signed   By: Charlett Nose M.D.   On: 04/05/2023 02:18   CT Head Wo Contrast Result Date: 04/05/2023 CLINICAL DATA:  Neuro deficit, acute, stroke suspected. Shortness of breath, back pain EXAM: CT HEAD WITHOUT CONTRAST TECHNIQUE: Contiguous axial images were obtained from the base of the skull through the vertex without intravenous contrast. RADIATION DOSE REDUCTION: This exam was performed according to the departmental dose-optimization program which includes automated exposure control, adjustment of the mA and/or kV according to patient size and/or use of iterative reconstruction technique. COMPARISON:  03/25/2023 FINDINGS: Brain: Diffuse cerebral atrophy. No acute intracranial abnormality. Specifically, no hemorrhage, hydrocephalus, mass lesion, acute infarction, or significant intracranial injury. Vascular: No hyperdense vessel or unexpected calcification. Skull: No acute calvarial abnormality. Sinuses/Orbits: No acute findings Other: None IMPRESSION: No acute intracranial abnormality. Electronically Signed   By: Charlett Nose M.D.   On: 04/05/2023 02:15   DG Lumbar Spine Complete Result Date: 04/05/2023 CLINICAL DATA:  Low back pain, no known injury, initial encounter EXAM: LUMBAR SPINE - COMPLETE 4+ VIEW COMPARISON:  None Available. FINDINGS: Five lumbar type vertebral bodies are well visualized. Vertebral body height is well maintained. Osteophytic changes are noted. Facet hypertrophic changes are seen. No anterolisthesis is noted. IMPRESSION: Degenerative changes of the lumbar spine are noted without acute abnormality. Electronically Signed   By: Alcide Clever M.D.   On: 04/05/2023 00:51   DG Thoracic Spine 4V Result Date: 04/05/2023 CLINICAL DATA:  Upper back pain, initial encounter EXAM: THORACIC SPINE - 4+ VIEW COMPARISON:  None Available. FINDINGS: Vertebral body height is well maintained. No anterolisthesis is noted. Mild osteophytic changes are  seen. Changes of prior TAVR are noted. IMPRESSION: Degenerative change without acute abnormality. Electronically Signed   By: Alcide Clever M.D.   On: 04/05/2023 00:49   DG Chest 2 View Result Date: 04/04/2023 CLINICAL DATA:  Shortness of breath EXAM: CHEST - 2 VIEW COMPARISON:  03/24/2023 FINDINGS: Heart is borderline in size. Mediastinal contours within normal limits. Aortic atherosclerosis. No overt edema or effusions. No acute bony abnormality. IMPRESSION: No active cardiopulmonary disease. Electronically Signed   By: Charlett Nose M.D.   On: 04/04/2023 21:36    Pending Labs Unresulted Labs (From admission, onward)     Start     Ordered   04/05/23 0500  CBC with Differential/Platelet  Tomorrow morning,   R        04/05/23 0322  Vitals/Pain Today's Vitals   04/05/23 0242 04/05/23 0315 04/05/23 0430 04/05/23 0430  BP:   123/69 128/74  Pulse:  80 88 90  Resp:  20 (!) 27 20  Temp:    98.1 F (36.7 C)  TempSrc:    Oral  SpO2: 96% 95% 97% 97%  Weight:      Height:      PainSc:        Isolation Precautions No active isolations  Medications Medications  acetaminophen (TYLENOL) tablet 650 mg (has no administration in time range)    Or  acetaminophen (TYLENOL) suppository 650 mg (has no administration in time range)  melatonin tablet 3 mg (has no administration in time range)  ondansetron (ZOFRAN) injection 4 mg (has no administration in time range)  morphine (PF) 4 MG/ML injection 4 mg (4 mg Intravenous Given 04/04/23 2116)  furosemide (LASIX) injection 40 mg (40 mg Intravenous Given 04/04/23 2303)  ondansetron (ZOFRAN) injection 4 mg (4 mg Intravenous Given 04/04/23 2259)  HYDROmorphone (DILAUDID) injection 0.5 mg (0.5 mg Intravenous Given 04/04/23 2306)  dexamethasone (DECADRON) injection 10 mg (10 mg Intravenous Given 04/04/23 2301)  LORazepam (ATIVAN) injection 0.5 mg (0.5 mg Intravenous Given 04/05/23 0126)  ondansetron (ZOFRAN) injection 4 mg (4 mg  Intravenous Given 04/05/23 0127)  iohexol (OMNIPAQUE) 350 MG/ML injection 65 mL (65 mLs Intravenous Contrast Given 04/05/23 0205)  furosemide (LASIX) injection 60 mg (60 mg Intravenous Given 04/05/23 0443)  potassium chloride SA (KLOR-CON M) CR tablet 40 mEq (40 mEq Oral Given 04/05/23 0435)    Mobility walks     Focused Assessments  R Recommendations: See Admitting Provider Note  Report given to:   Additional Notes:Patient is alert /oriented, uses [facility]

## 2023-04-05 NOTE — ED Notes (Signed)
 CCMD aware of transfer

## 2023-04-05 NOTE — H&P (Signed)
History and Physical    Patient: Breanna Webster UEA:[id number] DOB: 01/06/1946 DOA: 04/04/2023 DOS: the patient was seen and examined on 04/05/2023 PCP: Shirline Frees, NP  Patient coming from: Home Medical readiness/disposition: Anticipate ready for discharge no later than 04/08/2023.  Anticipate will return to home Mobility: Ambulates with a cane  Chief Complaint:  Chief Complaint  Patient presents with   Shortness of Breath   HPI: Breanna Webster is a 77 y.o. female with medical history significant of history of atrial fibrillation with RVR, prior strep bacteremia, CAD with stent to RCA in 2019, carotid artery stenosis with history of acute cerebellar stroke in 2020 and an embolic right posterior cerebral artery stroke in 2022, HFpEF, diverticulosis with prior diverticulitis, GERD, Graves' disease with posttreatment hypothyroidism, total shoulder replacement on the left, dyslipidemia, hypertension, parkinsonian symptoms, status post TAVR, type 2 diabetes.  Patient presented to the ER with reports of shortness of breath.  According to the triage note patient was brought in by EMS and reported sudden onset of shortness of breath, back pain, lower neck pain, nausea, anxiety and impending doom.  She was also having pain under her breast.  EMS states gradual increase in elevation on EKG and patient reporting a level 8/10 pain upon arrival.  Patient was afebrile with minimally hypertensive blood pressure readings.  Initial room air sats have ranged between 94 and 98%.  Patient was tachypneic.  Show EKG sinus rhythm with left bundle branch block making it difficult to evaluate for ischemic changes.  BNP was elevated at 401.  Pollens were elevated at 48 and 42.  Patient had visited the ER with similar symptoms on 12/2 and troponin at that time was 20.  Previous troponins were in the normal ranges.  Glucose elevated at 244.  Upper back pain plain x-rays were obtained of the spine that revealed stable  degenerative changes.  CTA to rule out PE was negative for PE but did demonstrate cardiomegaly with vascular congestion as well as groundglass opacity in the lower lobes consistent with atelectasis but infectious process could not be ruled out.  CT of the head was unremarkable without acute changes.  Patient had visited her PCP and cardiologist recently because of similar symptoms and was instructed to increase her torsemide dose.  Patient states that because she was going back and forth to the doctor and the ER she did not want to take the diuretic because she would be incontinent of urine or could not get to the bathroom in time.  She has been given 1 dose of Lasix IV 40 mg in the ER and has diuresed 1025 cc.  Hospitalist service has been asked to evaluate the patient for admission.  Subsequently I evaluated the patient.  At times she seems mildly confused and I have a difficult time getting accurate history from her.  Asked her about any type of symptoms she was having before her cardiac stent was placed she stated she was not having any symptoms.  Please note that 1 year prior to stent placement she had an nuclear med stress test that was normal.  Currently she states she is breathing much better and does not use oxygen at home.  Because of her prior strokes and parkinsonian syndrome she has difficulty feeding herself and opening packages at times.  She reports neck and back pain that began after having her stroke several years ago.  Ambulates with a cane but ambulation is also difficult because of prior bilateral knee arthroscopy  procedures.  Review of Systems: As mentioned in the history of present illness. All other systems reviewed and are negative. Past Medical History:  Diagnosis Date   Anxiety    Arthritis    "back" (04/22/2018)   Atrial fibrillation with RVR (HCC) 10/21/2018   Back pain    Bacteremia due to Streptococcus Salivarius 10/07/2018   Blood transfusion without reported diagnosis     CAD (coronary artery disease)    a. 03/2018 s/p PCI/DES to the RCA (3.0x15 Onyx DES).   Carotid artery stenosis    Mild   Cerebellar stroke, acute (HCC) 10/21/2018   Cerebrovascular accident (CVA) due to embolism of right posterior cerebral artery (HCC) 08/03/2020   Chest pain    CHF (congestive heart failure) (HCC)    Chronic lower back pain    Cirrhosis (HCC)    Colon polyps    Diverticulitis    Diverticulosis    Esophageal thickening    seen on pre TAVR CT scan, also questionable cirrhosis. MRI recommended. Will refer to GI   GERD (gastroesophageal reflux disease)    Gout    Grave's disease    H/O total shoulder replacement, left 06/01/2022   Heart murmur    History of cardioembolic stroke 03/29/2020   History of colonic polyps 05/22/2017   History of hiatal hernia    Hyperlipidemia    Hypertension    Hypothyroidism    IBS (irritable bowel syndrome)    Osteopenia    Parkinson's syndrome (HCC) 04/23/2020   Pulmonary nodules    seen on pre TAVR CT. likley benign. no follow up recommended if pt low risk.   S/P TAVR (transcatheter aortic valve replacement)    Shortness of breath on exertion    Stroke (HCC) 2021   x2   Thalassemia minor    Thyroid disease    Type II diabetes mellitus (HCC)    Past Surgical History:  Procedure Laterality Date   72 HOUR PH STUDY N/A 03/03/2018   Procedure: 24 HOUR PH STUDY;  Surgeon: Napoleon Form, MD;  Location: [facility] ENDOSCOPY;  Service: Endoscopy;  Laterality: N/A;   COLONOSCOPY  2023   COLONOSCOPY W/ BIOPSIES AND POLYPECTOMY     CORONARY ANGIOGRAPHY Right 04/21/2018   Procedure: CORONARY ANGIOGRAPHY (CATH LAB);  Surgeon: Lyn Records, MD;  Location: [facility] INVASIVE CV LAB;  Service: Cardiovascular;  Laterality: Right;   CORONARY STENT INTERVENTION N/A 04/22/2018   Procedure: CORONARY STENT INTERVENTION;  Surgeon: Lyn Records, MD;  Location: [facility] INVASIVE CV LAB;  Service: Cardiovascular;  Laterality: N/A;   DILATION AND CURETTAGE  OF UTERUS     ESOPHAGEAL MANOMETRY N/A 03/03/2018   Procedure: ESOPHAGEAL MANOMETRY (EM);  Surgeon: Napoleon Form, MD;  Location: [facility] ENDOS[facility];  Service: Endoscopy;  Laterality: N/A;   HYSTEROSCOPY     fibroids   LAPAROSCOPIC CHOLECYSTECTOMY  1985   LAPAROSCOPY     fibroids   NISSEN FUNDOPLICATION  1990s   POLYPECTOMY     REVERSE SHOULDER ARTHROPLASTY Left 06/01/2022   Procedure: REVERSE SHOULDER ARTHROPLASTY;  Surgeon: Beverely Low, MD;  Location: [facility] ORS;  Service: Orthopedics;  Laterality: Left;  choice with interscalene block   RIGHT/LEFT HEART CATH AND CORONARY ANGIOGRAPHY N/A 02/20/2018   Procedure: RIGHT/LEFT HEART CATH AND CORONARY ANGIOGRAPHY;  Surgeon: Lyn Records, MD;  Location: [facility] INVASIVE CV LAB;  Service: Cardiovascular;  Laterality: N/A;   TEE WITHOUT CARDIOVERSION N/A 07/08/2018   Procedure: TRANSESOPHAGEAL ECHOCARDIOGRAM (TEE);  Surgeon: Kathleene Hazel, MD;  Location:  [facility] OR;  Service: Open Heart Surgery;  Laterality: N/A;   TEE WITHOUT CARDIOVERSION  10/07/2018   TEE WITHOUT CARDIOVERSION N/A 10/07/2018   Procedure: TRANSESOPHAGEAL ECHOCARDIOGRAM (TEE);  Surgeon: Jake Bathe, MD;  Location: [facility] ENDOSCOPY;  Service: Cardiovascular;  Laterality: N/A;   TONSILLECTOMY     age 42   TRANSCATHETER AORTIC VALVE REPLACEMENT, TRANSFEMORAL N/A 07/08/2018   Procedure: TRANSCATHETER AORTIC VALVE REPLACEMENT, TRANSFEMORAL;  Surgeon: Kathleene Hazel, MD;  Location: [facility] OR;  Service: Open Heart Surgery;  Laterality: N/A;   Social History:  reports that she has never smoked. She has never been exposed to tobacco smoke. She has never used smokeless tobacco. She reports that she does not drink alcohol and does not use drugs.  No Known Allergies  Family History  Adopted: Yes  Problem Relation Age of Onset   Healthy Son        x 2   Headache Other        Cluster headaches   Heart failure Mother    Colon cancer Neg Hx    Pancreatic cancer Neg Hx    Rectal  cancer Neg Hx    Stomach cancer Neg Hx     Prior to Admission medications   Medication Sig Start Date End Date Taking? Authorizing Provider  acetaminophen (TYLENOL) 500 MG tablet Take 1,000 mg by mouth every 6 (six) hours as needed for moderate pain (pain score 4-6).   Yes [provider]  allopurinol (ZYLOPRIM) 100 MG tablet Take 1 tablet (100 mg total) by mouth daily. 02/28/23  Yes   amitriptyline (ELAVIL) 25 MG tablet Take 1 tablet (25 mg total) by mouth at bedtime. 01/17/23  Yes Nafziger, Kandee Keen, NP  apixaban (ELIQUIS) 5 MG TABS tablet Take 1 tablet (5 mg total) by mouth 2 (two) times daily. 02/21/23  Yes Nafziger, Kandee Keen, NP  b complex vitamins capsule Take 1 capsule by mouth daily.   Yes [provider]  Cholecalciferol (D3 PO) Take 1 tablet by mouth daily.   Yes [provider]  colchicine 0.6 MG tablet Take 1 tablet (0.6 mg total) by mouth daily. as needed for gout 02/28/23  Yes   diltiazem (CARTIA XT) 120 MG 24 hr capsule Take 1 capsule (120 mg total) by mouth daily. 02/03/23  Yes Cleaver, Thomasene Ripple, NP  HYDROcodone-acetaminophen (NORCO/VICODIN) 5-325 MG tablet Take 1 tablet by mouth every 6 (six) hours as needed. 04/04/23  Yes Gerhard Munch, MD  Insulin NPH, Human,, Isophane, (HUMULIN N KWIKPEN) 100 UNIT/ML Kiwkpen Inject 20 units into the skin every morning and 14 units every evening. 11/01/22  Yes   insulin regular (HUMULIN R) 100 units/mL injection Inject 0.1 mLs (10 Units total) into the skin 3 (three) times daily before meals Patient taking differently: Inject 10-12 Units into the skin 3 (three) times daily. Sliding scale based on testing number 01/09/23  Yes   metFORMIN (GLUCOPHAGE-XR) 500 MG 24 hr tablet Take 1 tablet by mouth 2 times daily 08/29/22  Yes   Multiple Vitamin (MULITIVITAMIN WITH MINERALS) TABS Take 1 tablet by mouth daily.   Yes [provider]  nitroGLYCERIN (NITROSTAT) 0.4 MG SL tablet Place 1 tablet (0.4 mg total) under the tongue every  5 (five) minutes as needed for chest pain. 11/14/21  Yes Lyn Records, MD  omeprazole (PRILOSEC) 20 MG capsule Take 1 capsule (20 mg total) by mouth daily. 04/04/23  Yes [name], [name], PA-C  ondansetron (ZOFRAN) 4 MG tablet Take 1 tablet (4 mg  total) by mouth every 8 (eight) hours as needed for nausea or vomiting. 04/04/23  Yes [name], [name], PA-C  polyethylene glycol (MIRALAX) 17 g packet Take 17 g by mouth daily as needed. 12/19/22  Yes [name], Willaim Rayas, MD  potassium chloride SA (KLOR-CON M) 20 MEQ tablet Take 1 tablet (20 mEq total) by mouth daily. Take in concurrence with torsemide. 11/07/22  Yes Nafziger, Kandee Keen, NP  predniSONE (DELTASONE) 20 MG tablet Take 2 tablets (40 mg total) by mouth daily with breakfast. For the next four days 04/04/23  Yes Gerhard Munch, MD  SYNTHROID 137 MCG tablet Take 1 tablet (137 mcg total) by mouth every morning on empty stomach. 01/24/23  Yes   tiZANidine (ZANAFLEX) 4 MG tablet Take 1 tablet (4 mg total) by mouth 3 (three) times daily. Patient taking differently: Take 4 mg by mouth daily as needed for muscle spasms. 01/09/23  Yes   torsemide (DEMADEX) 20 MG tablet Take 2 tablets (40 mg total) by mouth daily. Patient taking differently: Take 20 mg by mouth daily. 10/08/22  Yes Cleaver, Thomasene Ripple, NP  Vibegron (GEMTESA) 75 MG TABS Take 75 mg by mouth at bedtime.   Yes [provider]  vitamin E 180 MG (400 UNITS) capsule Take 400 Units by mouth daily.   Yes [provider]  ALPRAZolam Prudy Feeler) 0.5 MG tablet Take one tablet 30-60 minutes before MRI. Can take a second tablet if needed right before MRI Patient not taking: Reported on 04/04/2023 03/01/23   Shirline Frees, NP    Physical Exam: Vitals:   04/05/23 0315 04/05/23 0430 04/05/23 0430 04/05/23 0600  BP:  123/69 128/74 (!) 141/74  Pulse: 80 88 90 81  Resp: 20 (!) 27 20 16   Temp:   98.1 F (36.7 C)   TempSrc:   Oral   SpO2: 95% 97% 97% 96%  Weight:      Height:        Constitutional: NAD, calm, comfortable Respiratory: Posterior exam revealed diffuse crackles. Normal respiratory effort. No accessory muscle use.  2 L oxygen Cardiovascular: Regular rate and rhythm, no murmurs / rubs / gallops. No extremity edema. 2+ pedal pulses.   Abdomen: no tenderness, no masses palpated. No obvious hepatosplenomegaly. Bowel sounds positive.  Musculoskeletal: no clubbing / cyanosis. No joint deformity upper and lower extremities. Good ROM, no contractures. Normal muscle tone.  Skin: no rashes, lesions, ulcers. No induration Neurologic: CN 2-12 grossly intact. Sensation intact,  Strength 4/5 x all 4 extremities.  Psychiatric: Alert and oriented x 3. Normal mood.     Data Reviewed:  ABG unremarkable except for slight the elevated bicarbonate at 4.0  Sodium 137, potassium 4.2, CO2 18 with a normal anion gap, glucose 244, BUN 17, creatinine 0.86  Normal phosphorus and magnesium  Normal LFTs  Troponin 48 and 42 with troponin on 12/2 was 28 and 26; troponin in June 2024 was 13 and 16  White count 4900 with hemoglobin 10.1, MCV 61.2, platelets 150,000, differential normal  Urinalysis unremarkable  Imaging as above  Echocardiogram July 17, 2022 with EF 60 to 65%, no regional wall motion abnormality, severe mitral annular calcification, normal functioning of aortic valve prosthesis   Assessment and Plan: Acute exacerbation of HFpEF Patient has been having ongoing symptoms for at least 1 week and was previously instructed to take extra doses of Demadex but did not Presented to ED with hypoxemia and workup consistent with volume overload/CHF exacerbation Excellent response to IV Lasix 40 mg-continue IV Lasix  40 mg every 12 hours along with potassium supplementation Repeat echocardiogram Daily weights with I/O Not on beta-blocker or ARB/ACE inhibitor prior to admission  CAD with prior DES stent to RCA 2019 Has been trending up and troponin and uncertain if this  is related to heart failure physiology or underlying ischemia-continue to trend troponin Patient mobility limited so exertional symptoms difficult to detect; patient also was asymptomatic prior to undergoing cath and stent placement Continue telemetry monitoring Cardiology consulted Repeating echocardiogram as above Sublingual nitroglycerin as needed for chest pain  Diabetes mellitus 2 on insulin Continue preadmission NPH insulin Follow CBGs and provide SSI Hemoglobin A1c was 6.2 in October 2024  History of TAVR Repeating echo today in regards to heart failure and concerns for possible ischemic symptoms at presentation  Hypertension Continue Cardizem  History of embolic CVA/history of PAF Continue statin and Eliquis Continue Cardizem Zanaflex for sequela of stroke including parkinsonian symptoms  HLD Patient reports had been on an injectable medication for cholesterol but this had been discontinued and she was unsure why Lipid panel in October 2024 revealed elevated triglycerides with elevated LDL of 105 Begin Lipitor 20 mg daily  Hypothyroidism Continue Synthroid  Iron deficiency anemia secondary to thalassemia minor Hemoglobin stable IV iron as needed Follows with Dr. Candise Che    Advance Care Planning:   Code Status: Full Code   VTE prophylaxis: Continue Eliquis  Consults: Cardiology  Family Communication: Patient only  Severity of Illness: The appropriate patient status for this patient is INPATIENT. Inpatient status is judged to be reasonable and necessary in order to provide the required intensity of service to ensure the patient's safety. The patient's presenting symptoms, physical exam findings, and initial radiographic and laboratory data in the context of their chronic comorbidities is felt to place them at high risk for further clinical deterioration. Furthermore, it is not anticipated that the patient will be medically stable for discharge from the hospital  within 2 midnights of admission.   * I certify that at the point of admission it is my clinical judgment that the patient will require inpatient hospital care spanning beyond 2 midnights from the point of admission due to high intensity of service, high risk for further deterioration and high frequency of surveillance required.*  Author: Junious Silk, NP 04/05/2023 7:23 AM  For on call review [email or web address].

## 2023-04-05 NOTE — ED Provider Notes (Signed)
Care assumed from Dr. Jeraldine Loots.  Patient here with shortness of breath as well as acute on chronic back pain.  Recent evaluations for same.  Awaiting urinalysis as well as thoracic and lumbar spine x-rays.  Mildly elevated BNP without new oxygen requirement.  May be some component of diastolic heart failure.  She is given a dose of Lasix.  Awaiting imaging and urinalysis.  X-rays of thoracic and lumbar spine are negative for acute pathology.  Urinalysis is negative. With her shortness of breath, CT PE study will be obtained though she has been compliant with her Eliquis.  CT head is normal.  CT angio chest shows no evidence of pulmonary embolism but does show cardiomegaly and vascular congestion and small pleural effusions.  Concern for possible volume overload.  Patient does have new oxygen requirements and was down to 88% on room air.  Last echocardiogram showed ejection fraction of 60 to 65% with diastolic dysfunction.  IV Lasix was given by previous shift.  Will plan admission given new oxygen requirement.  Discussed with Dr. Arlean Hopping.   Glynn Octave, MD 04/05/23 [phone]

## 2023-04-05 NOTE — TOC Initial Note (Signed)
Transition of Care [facility]) - Initial/Assessment Note    Patient Details  Name: Breanna Webster MRN: [id number] Date of Birth: [birth year]  Transition of Care [facility]) CM/SW Contact:    Leone Haven, RN Phone Number: 04/05/2023, 5:16 PM  Clinical Narrative:                 Per Spouse, From home with spouse, has PCP and insurance on file, states has no HH services in place at this time, has a cane at home.   States spouse, will transport her home at dc and family is support system, states gets medications from [facility].  Pta self ambulatory with cane.   Expected Discharge Plan: Home w Home Health Services Barriers to Discharge: Continued Medical Work up   Patient Goals and CMS Choice Patient states their goals for this hospitalization and ongoing recovery are:: return home   Choice offered to / list presented to : NA      Expected Discharge Plan and Services In-house Referral: NA Discharge Planning Services: CM Consult Post Acute Care Choice: NA Living arrangements for the past 2 months: Single Family Home                 DME Arranged: N/A DME Agency: NA       HH Arranged: NA          Prior Living Arrangements/Services Living arrangements for the past 2 months: Single Family Home Lives with:: Spouse Patient language and need for interpreter reviewed:: Yes        Need for Family Participation in Patient Care: Yes (Comment) Care giver support system in place?: Yes (comment) Current home services: DME (cane) Criminal Activity/Legal Involvement Pertinent to Current Situation/Hospitalization: No - Comment as needed  Activities of Daily Living   ADL Screening (condition at time of admission) Independently performs ADLs?: No Does the patient have a NEW difficulty with bathing/dressing/toileting/self-feeding that is expected to last >3 days?: Yes (Initiates electronic notice to provider for possible OT consult) (weak) Does the patient have a NEW difficulty with  getting in/out of bed, walking, or climbing stairs that is expected to last >3 days?: Yes (Initiates electronic notice to provider for possible PT consult) (weak) Does the patient have a NEW difficulty with communication that is expected to last >3 days?: No Is the patient deaf or have difficulty hearing?: No Does the patient have difficulty seeing, even when wearing glasses/contacts?: Yes Does the patient have difficulty concentrating, remembering, or making decisions?: Yes  Permission Sought/Granted Permission sought to share information with : Case Manager Permission granted to share information with : Yes, Verbal Permission Granted              Emotional Assessment Appearance:: Appears stated age Attitude/Demeanor/Rapport: Engaged Affect (typically observed): Appropriate Orientation: : Oriented to Self, Oriented to Place, Oriented to  Time Alcohol / Substance Use: Not Applicable Psych Involvement: No (comment)  Admission diagnosis:  Acute on chronic diastolic heart failure (HCC) [I50.33] SOB (shortness of breath) [R06.02] Acute on chronic diastolic congestive heart failure (HCC) [I50.33] Midline thoracic back pain, unspecified chronicity [M54.6] Patient Active Problem List   Diagnosis Date Noted   Acute on chronic diastolic heart failure (HCC) 04/05/2023   Low back pain 03/25/2023   Intractable pain 10/16/2022   Type 2 diabetes mellitus with other specified complication, with long-term current use of insulin (HCC) 07/31/2022   Lymphedema of arm 07/31/2022   Preoperative cardiovascular examination 02/09/2022   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HCC) 10/25/2021  Left hemiparesis (HCC) 06/21/2021   Other specified hereditary hemolytic anemias (HCC) 06/21/2021   Parkinsonism (HCC) 06/21/2021   COVID-19 02/14/2021   Postviral fatigue syndrome 02/14/2021   Difficulty with adaptive servo-ventilation (ASV) use 02/14/2021   Sepsis secondary to UTI (HCC) 09/08/2020   Elevated  ALT measurement 09/08/2020   History of CVA with residual deficit 09/08/2020   Hyperbilirubinemia 09/08/2020   Fatigue associated with anemia 08/03/2020   OSA treated with BiPAP 08/03/2020   Treatment-emergent central sleep apnea 08/03/2020   Chronic intermittent hypoxia with obstructive sleep apnea 04/21/2020   Gait disturbance, post-stroke 03/29/2020   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 (HCC) 03/29/2020   Anxiety    RLQ abdominal pain 10/22/2019   Paroxysmal atrial fibrillation (HCC) 05/22/2019   IDA (iron deficiency anemia) 05/07/2019   Streptococcal endocarditis    Endocarditis of mitral valve 10/07/2018   Severe aortic stenosis 07/08/2018   S/P TAVR (transcatheter aortic valve replacement) 07/08/2018   Esophageal thickening    Atherosclerotic heart disease of native coronary artery with other forms of angina pectoris (HCC) 04/22/2018   Gastroesophageal reflux disease    Pulmonary hypertension (HCC) 02/21/2018   Essential hypertension 07/15/2017   History of colonic polyps 05/22/2017   Elevated liver function tests 12/05/2016   DOE (dyspnea on exertion) 07/19/2016   Thalassemia minor 05/29/2016   Left bundle branch block 12/06/2015   Upper airway cough syndrome 10/14/2015   (HFpEF) heart failure with preserved ejection fraction (HCC) 10/10/2015   Vitamin D deficiency 03/10/2009   Hypothyroidism 12/13/2008   Other specified disorders of bladder 12/13/2008   PCP:  Shirline Frees, NP Pharmacy:   [facility] [address] [place] [address] Phone: [phone] Fax: [phone]     Social Drivers of Health (SDOH) Social History: SDOH Screenings   Food Insecurity: No Food Insecurity (04/05/2023)  Housing: Low Risk  (04/05/2023)  Transportation Needs: No Transportation Needs (04/05/2023)  Utilities: Not At Risk (04/05/2023)  Alcohol Screen: Low Risk  (08/29/2022)  Depression (PHQ2-9): Low Risk   (08/29/2022)  Financial Resource Strain: Patient Declined (12/09/2022)  Physical Activity: Unknown (12/09/2022)  Social Connections: Unknown (12/09/2022)  Stress: No Stress Concern Present (12/09/2022)  Tobacco Use: Low Risk  (04/04/2023)   SDOH Interventions:     Readmission Risk Interventions     No data to display

## 2023-04-05 NOTE — ED Notes (Signed)
Pt went to CT

## 2023-04-06 ENCOUNTER — Inpatient Hospital Stay (HOSPITAL_COMMUNITY): Payer: Medicare Other

## 2023-04-06 DIAGNOSIS — I5031 Acute diastolic (congestive) heart failure: Secondary | ICD-10-CM

## 2023-04-06 DIAGNOSIS — I10 Essential (primary) hypertension: Secondary | ICD-10-CM

## 2023-04-06 DIAGNOSIS — E038 Other specified hypothyroidism: Secondary | ICD-10-CM | POA: Diagnosis not present

## 2023-04-06 DIAGNOSIS — I480 Paroxysmal atrial fibrillation: Secondary | ICD-10-CM

## 2023-04-06 DIAGNOSIS — I5033 Acute on chronic diastolic (congestive) heart failure: Secondary | ICD-10-CM | POA: Diagnosis not present

## 2023-04-06 DIAGNOSIS — Z794 Long term (current) use of insulin: Secondary | ICD-10-CM

## 2023-04-06 DIAGNOSIS — G20C Parkinsonism, unspecified: Secondary | ICD-10-CM

## 2023-04-06 DIAGNOSIS — F32A Depression, unspecified: Secondary | ICD-10-CM

## 2023-04-06 DIAGNOSIS — E1169 Type 2 diabetes mellitus with other specified complication: Secondary | ICD-10-CM

## 2023-04-06 LAB — ECHOCARDIOGRAM COMPLETE
AR max vel: 1.41 cm2
AV Area VTI: 1.38 cm2
AV Area mean vel: 1.4 cm2
AV Mean grad: 10 mm[Hg]
AV Peak grad: 18.6 mm[Hg]
Ao pk vel: 2.16 m/s
Area-P 1/2: 3.12 cm2
Height: 65 in
S' Lateral: 4.2 cm
Weight: 2816 [oz_av]

## 2023-04-06 LAB — CBC
HCT: 28.7 % — ABNORMAL LOW (ref 36.0–46.0)
Hemoglobin: 8.9 g/dL — ABNORMAL LOW (ref 12.0–15.0)
MCH: 18.9 pg — ABNORMAL LOW (ref 26.0–34.0)
MCHC: 31 g/dL (ref 30.0–36.0)
MCV: 60.8 fL — ABNORMAL LOW (ref 80.0–100.0)
Platelets: 153 10*3/uL (ref 150–400)
RBC: 4.72 MIL/uL (ref 3.87–5.11)
RDW: 16.3 % — ABNORMAL HIGH (ref 11.5–15.5)
WBC: 6.8 10*3/uL (ref 4.0–10.5)
nRBC: 0 % (ref 0.0–0.2)

## 2023-04-06 LAB — GLUCOSE, CAPILLARY
Glucose-Capillary: 147 mg/dL — ABNORMAL HIGH (ref 70–99)
Glucose-Capillary: 164 mg/dL — ABNORMAL HIGH (ref 70–99)
Glucose-Capillary: 191 mg/dL — ABNORMAL HIGH (ref 70–99)
Glucose-Capillary: 188 mg/dL — ABNORMAL HIGH (ref 70–99)
Glucose-Capillary: 159 mg/dL — ABNORMAL HIGH (ref 70–99)

## 2023-04-06 LAB — BASIC METABOLIC PANEL
Anion gap: 7 (ref 5–15)
BUN: 27 mg/dL — ABNORMAL HIGH (ref 8–23)
CO2: 23 mmol/L (ref 22–32)
Calcium: 9.4 mg/dL (ref 8.9–10.3)
Chloride: 104 mmol/L (ref 98–111)
Creatinine, Ser: 0.99 mg/dL (ref 0.44–1.00)
GFR, Estimated: 58 mL/min — ABNORMAL LOW (ref >60)
Glucose, Bld: 153 mg/dL — ABNORMAL HIGH (ref 70–99)
Potassium: 3.8 mmol/L (ref 3.5–5.1)
Sodium: 134 mmol/L — ABNORMAL LOW (ref 135–145)

## 2023-04-06 MED ORDER — PERFLUTREN LIPID MICROSPHERE
1.0000 mL | INTRAVENOUS | Status: AC | PRN
  Administered 2023-04-06: 4 mL via INTRAVENOUS

## 2023-04-06 MED ORDER — POLYETHYLENE GLYCOL 3350 17 G PO PACK
17.0000 g | PACK | Freq: Every day | ORAL | Status: DC
  Administered 2023-04-06 – 2023-04-09 (×4): 17 g via ORAL
  Filled 2023-04-06 (×4): qty 1

## 2023-04-06 NOTE — Progress Notes (Signed)
Progress Note   Patient: Breanna Webster ION:[id number] DOB: [birth year] DOA: 04/04/2023     1 DOS: the patient was seen and examined on 04/06/2023   Brief hospital course: Breanna Webster was admitted to the hospital with the working diagnosis of heart failure exacerbation.   77 yof w/ CHFpEF OSA onl CPAP, presented with 1 week of progressive shortness of breath.  She was reported to been instructed by her PCP and outpatient cardiologist to increase her outpatient dose of torsemide 40 mg p.o. daily, but she acknowledges she has not yet done this. Because of worsening symptoms EMS was called, she was found in distress and was transported to the ED. On her initial physical examination her blood pressure was 123/69, HR 88, RR 27 and 02 saturation 97%, lungs with rales bilaterally with no wheezing, heart with S1 and S2 present and regular with no gallops, rubs or murmurs, abdomen with no distention and no lower extremity edema.   Chest radiograph with hyperinflation, bilateral mild vascular hilar congestion, no effusions or infiltrates. Fluid in the right fissure per lateral film.   EKG 88 bpm, left axis deviation, left bundle branch block, qtc 546, sinus rhythm with no significant ST segment or T wave changes.   Patient was placed on furosemide for diuresis.      Assessment and Plan: * Acute on chronic diastolic heart failure (HCC) Echocardiogram is pending, study from 06/2022 had preserved LV systolic function.   Urine output is 2,325 ml Systolic blood pressure 120 mmHg range.   Plan to continue diuresis with IV furosemide 40 mg IV bid.  Continue SGLT 2 inh.  Follow up on new echocardiogram.   Essential hypertension Continue blood pressure monitoring.  Patient will benefit from ARB or ACE inh.   Paroxysmal atrial fibrillation (HCC) Continue rate control with diltiazem and anticoagulation with apixaban.  Continue telemetry monitoring.   Hypothyroidism Continue levothyroxine    Parkinsonism (HCC) Follow up as outpatient.  PT and OT evaluation.   Type 2 diabetes mellitus with other specified complication, with long-term current use of insulin (HCC) Continue glucose cover and monitoring with insulin sliding scale.  Basal insulin with NPH Close follow up capillary glucose  Fasting glucose today 153 mg/dl.   Depression Continue with amitriptyline and tizanidine.         Subjective: Patient is feeling better but not yet back to her baseline, no chest pain, no nausea or vomiting.   Physical Exam: Vitals:   04/06/23 0405 04/06/23 0759 04/06/23 0801 04/06/23 1205  BP: (!) 110/59 121/60  (!) 129/49  Pulse: (!) 58 60 64 65  Resp:  16  19  Temp: 97.8 F (36.6 C) 97.7 F (36.5 C)  97.6 F (36.4 C)  TempSrc:  Oral  Oral  SpO2: 97% 99% 99%   Weight:      Height:       Neurology awake and alert ENT with mild pallor Cardiovascular with S1 and S2 present and regular with no gallops, rubs or murmurs Respiratory with rales at bases with no wheezing or rhonchi Abdomen with no distention  Positive lower extremity edema  Data Reviewed:    Family Communication: I spoke with patient's husband at the bedside, we talked in detail about patient's condition, plan of care and prognosis and all questions were addressed.   Disposition: Status is: Inpatient Remains inpatient appropriate because: IV diuresis   Planned Discharge Destination: Home    Author: Coralie Keens, MD 04/06/2023 3:01 PM  For on  call review [email or web address].

## 2023-04-06 NOTE — Progress Notes (Signed)
*  PRELIMINARY RESULTS* Echocardiogram 2D Echocardiogram has been performed.  Breanna Webster 04/06/2023, 12:02 PM

## 2023-04-06 NOTE — Assessment & Plan Note (Signed)
Continue with amitriptyline and tizanidine.   Acute on Chronic back pain.  Patient required tramadol as needed for sever pain and  tizanidine to every 6 hrs as needed for muscle spasms.  Continue with lower dose of acetaminophen 650 mg scheduled q6 hrs, to avoid toxicity.  Had as needed hydroxyzine for anxiety.

## 2023-04-06 NOTE — Assessment & Plan Note (Signed)
Continue levothyroxine 

## 2023-04-06 NOTE — Assessment & Plan Note (Signed)
Patient was placed on insulin therapy during her hospitalization, sliding scale and basal with good toleration.  At home will continue her insulin regimen along with metformin.

## 2023-04-06 NOTE — Care Management (Signed)
Sent secure chat to [employer] team for test claim on SGLT2i per consult. Results may not come back until Monday

## 2023-04-06 NOTE — Progress Notes (Addendum)
1357: Patient c/o of dizziness while lying in bed at a 60 degree angle. Reports "my head feels heavy". BP 123/56 MAP (76). Denies chest pain or tightness. C/o of nausea- prn zofran given. CBG 191.  1410: Received call back for [facility] pharmacist. Informed ok to give metformin- renal function is fine. Patient still c/o of nausea- holding metformin at this time.

## 2023-04-06 NOTE — Assessment & Plan Note (Signed)
Continue rate control with diltiazem and anticoagulation with apixaban.  Continue telemetry monitoring.

## 2023-04-06 NOTE — Evaluation (Addendum)
Physical Therapy Evaluation Patient Details Name: Breanna Webster MRN: [id number] DOB: [birth year] Today's Date: 04/06/2023  History of Present Illness  77 y.o. female with medical history significant of history of atrial fibrillation with RVR, prior strep bacteremia, CAD with stent to RCA in 2019, carotid artery stenosis with history of acute cerebellar stroke in 2020 and an embolic right posterior cerebral artery stroke in 2022, HFpEF, diverticulosis with prior diverticulitis, GERD, Graves' disease with posttreatment hypothyroidism, total shoulder replacement on the left, dyslipidemia, hypertension, parkinsonian symptoms, status post TAVR, type 2 diabetes.  Patient adm 12/12 with reports of shortness of breath.  Acute on Chronic HF.  Clinical Impression  Pt is presenting close to baseline level of mobility. Prior to hospitalization spouse assisted at [facility] to Min A for mobility using gait belt and pt ambulated with SPC. Currently pt is Min to Max A for bed mobility (pt sleeps in recliner) and Min A for sit<>stand/transfers with RW. Due to pt current functional status, home set up and available assistance at home recommending skilled physical therapy services 3x/week in order to address strength, balance and functional mobility to decrease risk for falls, injury and re-hospitalization.           If plan is discharge home, recommend the following: A little help with walking and/or transfers;Assist for transportation;Assistance with cooking/housework     Equipment Recommendations None recommended by PT     Functional Status Assessment Patient has had a recent decline in their functional status and/or demonstrates limited ability to make significant improvements in function in a reasonable and predictable amount of time     Precautions / Restrictions Precautions Precautions: Fall Restrictions Weight Bearing Restrictions Per Provider Order: No      Mobility  Bed Mobility Overal bed mobility:  Needs Assistance Bed Mobility: Supine to Sit, Sit to Supine     Supine to sit: Min assist, HOB elevated, Used rails Sit to supine: Max assist   General bed mobility comments: Pt reports dizziness in all positions no significant change in BP, Min A for assist out of supine and Max A for sitting to supine. Pt sleeps in recliner at home.    Transfers Overall transfer level: Needs assistance Equipment used: Rolling walker (2 wheels) Transfers: Sit to/from Stand, Bed to chair/wheelchair/BSC Sit to Stand: Min assist   Step pivot transfers: Min assist       General transfer comment: Pt pulled up from RW at Min A    Ambulation/Gait     General Gait Details: pt deferred gait due to dizziness.     Balance Overall balance assessment: Needs assistance Sitting-balance support: Feet supported Sitting balance-Leahy Scale: Fair     Standing balance support: Reliant on assistive device for balance Standing balance-Leahy Scale: Fair Standing balance comment: no overt LOB         Pertinent Vitals/Pain Pain Assessment Pain Assessment: No/denies pain (pt is reporting nausea) Pain Intervention(s): Monitored during session    Home Living Family/patient expects to be discharged to:: Private residence Living Arrangements: Spouse/significant other Available Help at Discharge: Family;Available 24 hours/day Type of Home: House Home Access: Level entry       Home Layout: One level Home Equipment: Cane - single Librarian, academic (2 wheels);Shower seat      Prior Function Prior Level of Function : Needs assist       Physical Assist : Mobility (physical);ADLs (physical) Mobility (physical): Transfers;Gait ADLs (physical): Bathing;Dressing;Toileting;Grooming Mobility Comments: used cane for ambulation, and uses a gait belt ADLs  Comments: pt reports husband helps her with "everything"     Extremity/Trunk Assessment   Upper Extremity Assessment Upper Extremity Assessment:  Defer to OT evaluation    Lower Extremity Assessment Lower Extremity Assessment: Generalized weakness    Cervical / Trunk Assessment Cervical / Trunk Assessment: Kyphotic  Communication   Communication Communication: No apparent difficulties  Cognition Arousal: Alert Behavior During Therapy: WFL for tasks assessed/performed Overall Cognitive Status: Within Functional Limits for tasks assessed               Assessment/Plan    PT Assessment Patient needs continued PT services  PT Problem List Decreased strength;Decreased mobility;Decreased activity tolerance;Decreased balance       PT Treatment Interventions DME instruction;Therapeutic exercise;Gait training;Balance training;Functional mobility training;Therapeutic activities;Patient/family education;Neuromuscular re-education    PT Goals (Current goals can be found in the Care Plan section)  Acute Rehab PT Goals Patient Stated Goal: to return home PT Goal Formulation: With patient Time For Goal Achievement: 04/20/23 Potential to Achieve Goals: Fair    Frequency Min 1X/week        AM-PAC PT "6 Clicks" Mobility  Outcome Measure Help needed turning from your back to your side while in a flat bed without using bedrails?: A Little Help needed moving from lying on your back to sitting on the side of a flat bed without using bedrails?: A Little Help needed moving to and from a bed to a chair (including a wheelchair)?: A Little Help needed standing up from a chair using your arms (e.g., wheelchair or bedside chair)?: A Little Help needed to walk in hospital room?: A Little Help needed climbing 3-5 steps with a railing? : A Lot 6 Click Score: 17    End of Session Equipment Utilized During Treatment: Gait belt;Oxygen Activity Tolerance: Patient tolerated treatment well;Other (comment) (limited due to dizziness) Patient left: in bed;with call bell/phone within reach Nurse Communication: Mobility status PT Visit  Diagnosis: Other abnormalities of gait and mobility (R26.89)    Time: 1610-9604 PT Time Calculation (min) (ACUTE ONLY): 32 min   Charges:   PT Evaluation $PT Eval Low Complexity: 1 Low PT Treatments $Therapeutic Activity: 8-22 mins PT General Charges $$ ACUTE PT VISIT: 1 Visit         Harrel Carina, DPT, CLT  Acute Rehabilitation Services Office: [phone] (Secure chat preferred)   Claudia Desanctis 04/06/2023, 2:51 PM

## 2023-04-06 NOTE — Progress Notes (Signed)
   Rounding Note    Patient Name: Breanna Webster Date of Encounter: 04/06/2023  [facility] Cardiologist: Parke Poisson, MD   Subjective   NAEO. Breathing better this AM.  Vital Signs    Vitals:   04/05/23 2040 04/05/23 2335 04/06/23 0405 04/06/23 0759  BP: (!) 147/78 124/60 (!) 110/59 121/60  Pulse: 91 70 (!) 58 60  Resp:  16  16  Temp: 98 F (36.7 C) 98 F (36.7 C) 97.8 F (36.6 C) 97.7 F (36.5 C)  TempSrc: Oral Oral  Oral  SpO2: 100% 96% 97% 99%  Weight:      Height:        Intake/Output Summary (Last 24 hours) at 04/06/2023 0928 Last data filed at 04/06/2023 3329 Gross per 24 hour  Intake 360 ml  Output 2800 ml  Net -2440 ml      04/04/2023    8:39 PM 04/04/2023    1:50 PM 03/24/2023   10:45 PM  Last 3 Weights  Weight (lbs) 176 lb -- 174 lb 2.6 oz  Weight (kg) 79.833 kg -- 79 kg      Physical Exam   GEN: No acute distress.  Frail. Elderly. Cardiac: RRR, no murmurs, rubs, or gallops.  Respiratory: Clear to auscultation bilaterally. Psych: Normal affect   Assessment & Plan    #Acute on chronic diastolic heart failure Warm and still volume up on exam. Cont IV diuresis. Echo pending  #AF In sinus rhythm Cont eliquis, dilt    Cathern Tahir T. Lalla Brothers, MD, [name], [facility] Cardiac Electrophysiology

## 2023-04-06 NOTE — Assessment & Plan Note (Signed)
Continue blood pressure monitoring.  Patient will benefit from ARB or ACE inh.

## 2023-04-06 NOTE — Progress Notes (Deleted)
Cardiology Office Note:    Date:  04/06/2023   ID:  Breanna Webster, DOB January 03, 1946, MRN [id number]  PCP:  Breanna Frees, NP   [facility] Providers Cardiologist:  Parke Poisson, MD { Click to update primary MD,subspecialty MD or APP then REFRESH:1}    Referring MD: Breanna Frees, NP   No chief complaint on file. ***  History of Present Illness:    Breanna Webster is a 77 y.o. female with a hx of ***  Past Medical History:  Diagnosis Date   Anxiety    Arthritis    "back" (04/22/2018)   Atrial fibrillation with RVR (HCC) 10/21/2018   Back pain    Bacteremia due to Streptococcus Salivarius 10/07/2018   Blood transfusion without reported diagnosis    CAD (coronary artery disease)    a. 03/2018 s/p PCI/DES to the RCA (3.0x15 Onyx DES).   Carotid artery stenosis    Mild   Cerebellar stroke, acute (HCC) 10/21/2018   Cerebrovascular accident (CVA) due to embolism of right posterior cerebral artery (HCC) 08/03/2020   Chest pain    CHF (congestive heart failure) (HCC)    Chronic lower back pain    Cirrhosis (HCC)    Colon polyps    Diverticulitis    Diverticulosis    Esophageal thickening    seen on pre TAVR CT scan, also questionable cirrhosis. MRI recommended. Will refer to GI   GERD (gastroesophageal reflux disease)    Gout    Grave's disease    H/O total shoulder replacement, left 06/01/2022   Heart murmur    History of cardioembolic stroke 03/29/2020   History of colonic polyps 05/22/2017   History of hiatal hernia    Hyperlipidemia    Hypertension    Hypothyroidism    IBS (irritable bowel syndrome)    Osteopenia    Parkinson's syndrome (HCC) 04/23/2020   Pulmonary nodules    seen on pre TAVR CT. likley benign. no follow up recommended if pt low risk.   S/P TAVR (transcatheter aortic valve replacement)    Shortness of breath on exertion    Stroke (HCC) 2021   x2   Thalassemia minor    Thyroid disease    Type II diabetes mellitus (HCC)      Past Surgical History:  Procedure Laterality Date   50 HOUR PH STUDY N/A 03/03/2018   Procedure: 24 HOUR PH STUDY;  Surgeon: Napoleon Form, MD;  Location: [facility] ENDOSCOPY;  Service: Endoscopy;  Laterality: N/A;   COLONOSCOPY  2023   COLONOSCOPY W/ BIOPSIES AND POLYPECTOMY     CORONARY ANGIOGRAPHY Right 04/21/2018   Procedure: CORONARY ANGIOGRAPHY (CATH LAB);  Surgeon: Lyn Records, MD;  Location: [facility] INVASIVE CV LAB;  Service: Cardiovascular;  Laterality: Right;   CORONARY STENT INTERVENTION N/A 04/22/2018   Procedure: CORONARY STENT INTERVENTION;  Surgeon: Lyn Records, MD;  Location: [facility] INVASIVE CV LAB;  Service: Cardiovascular;  Laterality: N/A;   DILATION AND CURETTAGE OF UTERUS     ESOPHAGEAL MANOMETRY N/A 03/03/2018   Procedure: ESOPHAGEAL MANOMETRY (EM);  Surgeon: Napoleon Form, MD;  Location: [facility] ENDOSCOPY;  Service: Endoscopy;  Laterality: N/A;   HYSTEROSCOPY     fibroids   LAPAROSCOPIC CHOLECYSTECTOMY  1985   LAPAROSCOPY     fibroids   NISSEN FUNDOPLICATION  1990s   POLYPECTOMY     REVERSE SHOULDER ARTHROPLASTY Left 06/01/2022   Procedure: REVERSE SHOULDER ARTHROPLASTY;  Surgeon: Beverely Low, MD;  Location: [facility] ORS;  Service:  Orthopedics;  Laterality: Left;  choice with interscalene block   RIGHT/LEFT HEART CATH AND CORONARY ANGIOGRAPHY N/A 02/20/2018   Procedure: RIGHT/LEFT HEART CATH AND CORONARY ANGIOGRAPHY;  Surgeon: Lyn Records, MD;  Location: [facility] INVASIVE CV LAB;  Service: Cardiovascular;  Laterality: N/A;   TEE WITHOUT CARDIOVERSION N/A 07/08/2018   Procedure: TRANSESOPHAGEAL ECHOCARDIOGRAM (TEE);  Surgeon: Kathleene Hazel, MD;  Location: [facility] OR;  Service: Open Heart Surgery;  Laterality: N/A;   TEE WITHOUT CARDIOVERSION  10/07/2018   TEE WITHOUT CARDIOVERSION N/A 10/07/2018   Procedure: TRANSESOPHAGEAL ECHOCARDIOGRAM (TEE);  Surgeon: Jake Bathe, MD;  Location: [facility] ENDOSCOPY;  Service: Cardiovascular;  Laterality: N/A;   TONSILLECTOMY     age  21   TRANSCATHETER AORTIC VALVE REPLACEMENT, TRANSFEMORAL N/A 07/08/2018   Procedure: TRANSCATHETER AORTIC VALVE REPLACEMENT, TRANSFEMORAL;  Surgeon: Kathleene Hazel, MD;  Location: [facility] OR;  Service: Open Heart Surgery;  Laterality: N/A;    Current Medications: No outpatient medications have been marked as taking for the 04/11/23 encounter (Appointment) with Marcelino Duster, PA.     Allergies:   Patient has no known allergies.   Social History   Socioeconomic History   Marital status: Married    Spouse name: Not on file   Number of children: 2   Years of education: Not on file   Highest education level: Associate degree: academic program  Occupational History   Occupation: Health and safety inspector of the [employer]  Tobacco Use   Smoking status: Never    Passive exposure: Never   Smokeless tobacco: Never  Vaping Use   Vaping status: Never Used  Substance and Sexual Activity   Alcohol use: No    Alcohol/week: 0.0 standard drinks of alcohol   Drug use: Never   Sexual activity: Not Currently  Other Topics Concern   Not on file  Social History Narrative   Lives with husband in a one story home.     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   Education: college.   Social Drivers of Health   Financial Resource Strain: Patient Declined (12/09/2022)   Overall Financial Resource Strain (CARDIA)    Difficulty of Paying Living Expenses: Patient declined  Food Insecurity: No Food Insecurity (04/05/2023)   Hunger Vital Sign    Worried About Running Out of Food in the Last Year: Never true    Ran Out of Food in the Last Year: Never true  Transportation Needs: No Transportation Needs (04/05/2023)   PRAPARE - Administrator, Civil Service (Medical): No    Lack of Transportation (Non-Medical): No  Physical Activity: Unknown (12/09/2022)   Exercise Vital Sign    Days of Exercise per Week:  Patient declined    Minutes of Exercise per Session: 40 min  Stress: No Stress Concern Present (12/09/2022)   [employer] of Occupational Health - Occupational Stress Questionnaire    Feeling of Stress : Only a little  Social Connections: Unknown (12/09/2022)   Social Connection and Isolation Panel [NHANES]    Frequency of Communication with Friends and Family: More than three times a week    Frequency of Social Gatherings with Friends and Family: Patient declined    Attends Religious Services: Patient declined    Database administrator or Organizations: Patient declined    Attends Engineer, structural: More than 4 times per year    Marital Status: Married     Family  History: The patient's ***family history includes Headache in an other family member; Healthy in her son; Heart failure in her mother. There is no history of Colon cancer, Pancreatic cancer, Rectal cancer, or Stomach cancer. She was adopted.  ROS:   Please see the history of present illness.    *** All other systems reviewed and are negative.  EKGs/Labs/Other Studies Reviewed:    The following studies were reviewed today: ***      Recent Labs: 08/31/2022: NT-Pro BNP 511 04/04/2023: B Natriuretic Peptide 401.3 04/05/2023: ALT 23; Magnesium 1.7 04/06/2023: BUN 27; Creatinine, Ser 0.99; Hemoglobin 8.9; Platelets 153; Potassium 3.8; Sodium 134  Recent Lipid Panel    Component Value Date/Time   CHOL 177 02/07/2023 1307   TRIG 150 (H) 02/07/2023 1307   HDL 46 02/07/2023 1307   CHOLHDL 3.8 02/07/2023 1307   CHOLHDL 2.1 09/06/2021 1018   VLDL 20 09/06/2021 1018   LDLCALC 105 (H) 02/07/2023 1307   LDLDIRECT 98.0 05/24/2014 1343     Risk Assessment/Calculations:   {Does this patient have ATRIAL FIBRILLATION?:[phone]}            Physical Exam:    VS:  There were no vitals taken for this visit.    Wt Readings from Last 3 Encounters:  04/04/23 176 lb (79.8 kg)  03/24/23 174 lb 2.6 oz (79 kg)   03/07/23 175 lb (79.4 kg)     GEN: *** Well nourished, well developed in no acute distress HEENT: Normal NECK: No JVD; No carotid bruits LYMPHATICS: No lymphadenopathy CARDIAC: ***RRR, no murmurs, rubs, gallops RESPIRATORY:  Clear to auscultation without rales, wheezing or rhonchi  ABDOMEN: Soft, non-tender, non-distended MUSCULOSKELETAL:  No edema; No deformity  SKIN: Warm and dry NEUROLOGIC:  Alert and oriented x 3 PSYCHIATRIC:  Normal affect   ASSESSMENT:    No diagnosis found. PLAN:    In order of problems listed above:  ***      {Are you ordering a CV Procedure (e.g. stress test, cath, DCCV, TEE, etc)?   Press F2        :[id number]}    Medication Adjustments/Labs and Tests Ordered: Current medicines are reviewed at length with the patient today.  Concerns regarding medicines are outlined above.  No orders of the defined types were placed in this encounter.  No orders of the defined types were placed in this encounter.   There are no Patient Instructions on file for this visit.   Signed, Marcelino Duster, PA  04/06/2023 4:32 PM    [facility]

## 2023-04-06 NOTE — Assessment & Plan Note (Signed)
Follow up as outpatient.  PT and OT evaluation.

## 2023-04-06 NOTE — Assessment & Plan Note (Signed)
Echocardiogram is pending, study from 06/2022 had preserved LV systolic function.   Urine output is 2,325 ml Systolic blood pressure 120 mmHg range.   Plan to continue diuresis with IV furosemide 40 mg IV bid.  Continue SGLT 2 inh.  Follow up on new echocardiogram.

## 2023-04-07 DIAGNOSIS — N179 Acute kidney failure, unspecified: Secondary | ICD-10-CM

## 2023-04-07 DIAGNOSIS — I480 Paroxysmal atrial fibrillation: Secondary | ICD-10-CM | POA: Diagnosis not present

## 2023-04-07 DIAGNOSIS — E038 Other specified hypothyroidism: Secondary | ICD-10-CM | POA: Diagnosis not present

## 2023-04-07 DIAGNOSIS — I5033 Acute on chronic diastolic (congestive) heart failure: Secondary | ICD-10-CM | POA: Diagnosis not present

## 2023-04-07 DIAGNOSIS — I10 Essential (primary) hypertension: Secondary | ICD-10-CM | POA: Diagnosis not present

## 2023-04-07 LAB — BASIC METABOLIC PANEL
Anion gap: 11 (ref 5–15)
BUN: 27 mg/dL — ABNORMAL HIGH (ref 8–23)
CO2: 25 mmol/L (ref 22–32)
Calcium: 9.9 mg/dL (ref 8.9–10.3)
Chloride: 103 mmol/L (ref 98–111)
Creatinine, Ser: 1.2 mg/dL — ABNORMAL HIGH (ref 0.44–1.00)
GFR, Estimated: 46 mL/min — ABNORMAL LOW (ref >60)
Glucose, Bld: 149 mg/dL — ABNORMAL HIGH (ref 70–99)
Potassium: 3.7 mmol/L (ref 3.5–5.1)
Sodium: 139 mmol/L (ref 135–145)

## 2023-04-07 LAB — GLUCOSE, CAPILLARY
Glucose-Capillary: 131 mg/dL — ABNORMAL HIGH (ref 70–99)
Glucose-Capillary: 188 mg/dL — ABNORMAL HIGH (ref 70–99)
Glucose-Capillary: 118 mg/dL — ABNORMAL HIGH (ref 70–99)

## 2023-04-07 LAB — MAGNESIUM: Magnesium: 2.1 mg/dL (ref 1.7–2.4)

## 2023-04-07 MED ORDER — POTASSIUM CHLORIDE CRYS ER 20 MEQ PO TBCR
40.0000 meq | EXTENDED_RELEASE_TABLET | Freq: Once | ORAL | Status: AC
  Administered 2023-04-07: 40 meq via ORAL
  Filled 2023-04-07: qty 2

## 2023-04-07 MED ORDER — FUROSEMIDE 10 MG/ML IJ SOLN
40.0000 mg | Freq: Every day | INTRAMUSCULAR | Status: DC
  Administered 2023-04-08: 40 mg via INTRAVENOUS
  Filled 2023-04-07: qty 4

## 2023-04-07 NOTE — Plan of Care (Signed)

## 2023-04-07 NOTE — Progress Notes (Signed)
   Rounding Note    Patient Name: Breanna Webster Date of Encounter: 04/07/2023  [facility] Cardiologist: Parke Poisson, MD   Subjective   NAEO.   Vital Signs    Vitals:   04/06/23 2002 04/06/23 2347 04/07/23 0545 04/07/23 0727  BP: 126/63 (!) 126/55 (!) 117/52 (!) 133/59  Pulse: 77 67 61 64  Resp: 20 17 17 18   Temp: 98.3 F (36.8 C) 97.6 F (36.4 C) 97.6 F (36.4 C) 97.7 F (36.5 C)  TempSrc: Oral Oral Oral Oral  SpO2: 100% 99% 99% 100%  Weight:   60.3 kg   Height:        Intake/Output Summary (Last 24 hours) at 04/07/2023 1115 Last data filed at 04/07/2023 0102 Gross per 24 hour  Intake 980 ml  Output 3350 ml  Net -2370 ml      04/07/2023    5:45 AM 04/04/2023    8:39 PM 04/04/2023    1:50 PM  Last 3 Weights  Weight (lbs) 132 lb 15 oz 176 lb --  Weight (kg) 60.3 kg 79.833 kg --      Physical Exam   GEN: No acute distress.  Frail. Elderly. Cardiac: RRR, no murmurs, rubs, or gallops.  Respiratory: Clear to auscultation bilaterally. Psych: Normal affect   Assessment & Plan    #Acute on chronic diastolic heart failure Warm and still volume up on exam. Cont IV diuresis. Decrease to once daily. Likely to oral diuretic tomorrow.  #AF In sinus rhythm Cont eliquis, dilt    Kendrik Mcshan T. Lalla Brothers, MD, [facility], [facility] Cardiac Electrophysiology

## 2023-04-07 NOTE — Progress Notes (Signed)
Mobility Specialist Progress Note    04/07/23 1046  Mobility  Activity Ambulated with assistance in room  Level of Assistance Contact guard assist, steadying assist  Assistive Device Front wheel walker  Distance Ambulated (ft) 15 ft  Activity Response Tolerated well  Mobility Referral Yes  Mobility visit 1 Mobility  Mobility Specialist Start Time (ACUTE ONLY) 1030  Mobility Specialist Stop Time (ACUTE ONLY) 1045  Mobility Specialist Time Calculation (min) (ACUTE ONLY) 15 min   Pre-Mobility: 80 HR During Mobility: 95 HR Post-Mobility: 86 HR  Pt received sitting EOB and agreeable. C/o some dizziness when getting around the bed. Left in chair with call bell in reach. NT and RN aware.   [place] Mobility Specialist  Please Neurosurgeon or Rehab Office at [phone]

## 2023-04-07 NOTE — Progress Notes (Signed)
Progress Note   Patient: Breanna Webster VHQ:[id number] DOB: 04/26/1945 DOA: 04/04/2023     2 DOS: the patient was seen and examined on 04/07/2023   Brief hospital course: [name] was admitted to the hospital with the working diagnosis of heart failure exacerbation.   77 yof w/ CHFpEF OSA onl CPAP, presented with 1 week of progressive shortness of breath.  She was reported to been instructed by her PCP and outpatient cardiologist to increase her outpatient dose of torsemide 40 mg p.o. daily, but she acknowledges she has not yet done this. Because of worsening symptoms EMS was called, she was found in distress and was transported to the ED. On her initial physical examination her blood pressure was 123/69, HR 88, RR 27 and 02 saturation 97%, lungs with rales bilaterally with no wheezing, heart with S1 and S2 present and regular with no gallops, rubs or murmurs, abdomen with no distention and no lower extremity edema.   Chest radiograph with hyperinflation, bilateral mild vascular hilar congestion, no effusions or infiltrates. Fluid in the right fissure per lateral film.   EKG 88 bpm, left axis deviation, left bundle branch block, qtc 546, sinus rhythm with no significant ST segment or T wave changes.   Patient was placed on furosemide for diuresis.   12/15 patient is responding well to diuresis.   Assessment and Plan: * Acute on chronic diastolic heart failure (HCC) Echocardiogram (limited acoustic windows), LV systolic function preserved with EF 50 to 55%, mild LVH, RV systolic function preserved, LA with moderate dilatation, mild mitral valve regurgitation, aortic valve (replaced/ repaired)   Urine output is 5,800 ml Systolic blood pressure 120 mmHg range.   Plan to continue diuresis with IV furosemide 40 mg IV, reduce dose to daily.   Continue SGLT 2 inh.  Possible addition of MRA at the time of discharge.   Essential hypertension Continue blood pressure monitoring.  Patient  will benefit from ARB or ACE inh when euvolemic.   Paroxysmal atrial fibrillation (HCC) Continue rate control with diltiazem and anticoagulation with apixaban.  Continue telemetry monitoring.   Hypothyroidism Continue levothyroxine   Parkinsonism (HCC) Follow up as outpatient.  PT and OT evaluation.   Type 2 diabetes mellitus with other specified complication, with long-term current use of insulin (HCC) Continue glucose cover and monitoring with insulin sliding scale.  Basal insulin with NPH Close follow up capillary glucose  Fasting glucose today 149 mg/dl.  Capillary glucose 159, 131, 188   Depression Continue with amitriptyline and tizanidine.   AKI (acute kidney injury) (HCC) Hyponatremia.   Renal function with serum cr at 1,20 with K at 3,7 and serum bicarbonate at 25  Na 139 and Mg 2.1   Will add 40 meq Kcl to prevent hypokalemia, for a total dose for today of 60 meq.  Reduced dose of furosemide to 40 mg daily IV.  Follow up renal function and electrolytes in am.         Subjective: Patient is feeling better, dyspnea and edema have been improving, no chest pain,   Physical Exam: Vitals:   04/06/23 2347 04/07/23 0545 04/07/23 0727 04/07/23 1123  BP: (!) 126/55 (!) 117/52 (!) 133/59 (!) 125/54  Pulse: 67 61 64 79  Resp: 17 17 18 18   Temp: 97.6 F (36.4 C) 97.6 F (36.4 C) 97.7 F (36.5 C) 98 F (36.7 C)  TempSrc: Oral Oral Oral Oral  SpO2: 99% 99% 100% 100%  Weight:  60.3 kg    Height:  Neurology awake and alert, noted with mild confusion today, not sure if at baseline, ENT with mild pallor Cardiovascular with S1 and S2 present and regular, positive systolic murmur at the apex with no gallops or rubs No JVD Trace lower extremity edema Respiratory with no rales or wheezing, no rhonchi Abdomen with no distention  Data Reviewed:    Family Communication: no family at the bedside   Disposition: Status is: Inpatient Remains inpatient  appropriate because: IV diuresis   Planned Discharge Destination: Home      Author: Coralie Keens, MD 04/07/2023 2:47 PM  For on call review [email or web address].

## 2023-04-07 NOTE — Assessment & Plan Note (Addendum)
Hyponatremia. Hypokalemia.   Renal function with serum cr at 0,98 with K at 3,9 and serum bicarbonate at 24  Na 136 and Mg 2.4   Plan to continue diuresis with torsemide and spironolactone Follow up renal function and electrolytes as outpatient.

## 2023-04-08 ENCOUNTER — Telehealth (HOSPITAL_COMMUNITY): Payer: Self-pay

## 2023-04-08 ENCOUNTER — Encounter: Payer: Self-pay | Admitting: Physician Assistant

## 2023-04-08 ENCOUNTER — Other Ambulatory Visit (HOSPITAL_COMMUNITY): Payer: Self-pay

## 2023-04-08 DIAGNOSIS — N179 Acute kidney failure, unspecified: Secondary | ICD-10-CM | POA: Diagnosis not present

## 2023-04-08 DIAGNOSIS — I10 Essential (primary) hypertension: Secondary | ICD-10-CM | POA: Diagnosis not present

## 2023-04-08 DIAGNOSIS — I5033 Acute on chronic diastolic (congestive) heart failure: Secondary | ICD-10-CM | POA: Diagnosis not present

## 2023-04-08 DIAGNOSIS — I480 Paroxysmal atrial fibrillation: Secondary | ICD-10-CM | POA: Diagnosis not present

## 2023-04-08 LAB — MAGNESIUM: Magnesium: 2.3 mg/dL (ref 1.7–2.4)

## 2023-04-08 LAB — BASIC METABOLIC PANEL
Anion gap: 8 (ref 5–15)
BUN: 26 mg/dL — ABNORMAL HIGH (ref 8–23)
CO2: 24 mmol/L (ref 22–32)
Calcium: 9.1 mg/dL (ref 8.9–10.3)
Chloride: 104 mmol/L (ref 98–111)
Creatinine, Ser: 0.98 mg/dL (ref 0.44–1.00)
GFR, Estimated: 59 mL/min — ABNORMAL LOW (ref >60)
Glucose, Bld: 116 mg/dL — ABNORMAL HIGH (ref 70–99)
Potassium: 3.6 mmol/L (ref 3.5–5.1)
Sodium: 136 mmol/L (ref 135–145)

## 2023-04-08 LAB — GLUCOSE, CAPILLARY
Glucose-Capillary: 162 mg/dL — ABNORMAL HIGH (ref 70–99)
Glucose-Capillary: 136 mg/dL — ABNORMAL HIGH (ref 70–99)
Glucose-Capillary: 144 mg/dL — ABNORMAL HIGH (ref 70–99)
Glucose-Capillary: 160 mg/dL — ABNORMAL HIGH (ref 70–99)
Glucose-Capillary: 127 mg/dL — ABNORMAL HIGH (ref 70–99)

## 2023-04-08 MED ORDER — ACETAMINOPHEN 325 MG PO TABS
650.0000 mg | ORAL_TABLET | Freq: Four times a day (QID) | ORAL | Status: DC | PRN
  Administered 2023-04-09: 650 mg via ORAL
  Filled 2023-04-08: qty 2

## 2023-04-08 MED ORDER — HYDROXYZINE HCL 10 MG PO TABS
10.0000 mg | ORAL_TABLET | Freq: Three times a day (TID) | ORAL | Status: DC | PRN
  Administered 2023-04-08 – 2023-04-09 (×2): 10 mg via ORAL
  Filled 2023-04-08 (×2): qty 1

## 2023-04-08 MED ORDER — TRAMADOL HCL 50 MG PO TABS
50.0000 mg | ORAL_TABLET | Freq: Four times a day (QID) | ORAL | Status: DC | PRN
  Administered 2023-04-08 – 2023-04-09 (×4): 50 mg via ORAL
  Filled 2023-04-08 (×4): qty 1

## 2023-04-08 MED ORDER — POTASSIUM CHLORIDE CRYS ER 20 MEQ PO TBCR
20.0000 meq | EXTENDED_RELEASE_TABLET | Freq: Once | ORAL | Status: AC
  Administered 2023-04-08: 20 meq via ORAL
  Filled 2023-04-08: qty 1

## 2023-04-08 MED ORDER — TORSEMIDE 20 MG PO TABS
40.0000 mg | ORAL_TABLET | Freq: Every day | ORAL | Status: DC
  Administered 2023-04-09: 40 mg via ORAL
  Filled 2023-04-08: qty 2

## 2023-04-08 MED ORDER — BISACODYL 10 MG RE SUPP
10.0000 mg | Freq: Once | RECTAL | Status: AC
  Administered 2023-04-08: 10 mg via RECTAL
  Filled 2023-04-08: qty 1

## 2023-04-08 MED ORDER — ACETAMINOPHEN 650 MG RE SUPP: 650.0000 mg | Freq: Four times a day (QID) | RECTAL | Status: DC | PRN

## 2023-04-08 MED ORDER — SPIRONOLACTONE 12.5 MG HALF TABLET
12.5000 mg | ORAL_TABLET | Freq: Every day | ORAL | Status: DC
  Administered 2023-04-08 – 2023-04-09 (×2): 12.5 mg via ORAL
  Filled 2023-04-08 (×2): qty 1

## 2023-04-08 MED ORDER — ACETAMINOPHEN 500 MG PO TABS
500.0000 mg | ORAL_TABLET | Freq: Four times a day (QID) | ORAL | Status: DC
  Administered 2023-04-08 – 2023-04-09 (×4): 500 mg via ORAL
  Filled 2023-04-08 (×4): qty 1

## 2023-04-08 MED ORDER — BISACODYL 5 MG PO TBEC
10.0000 mg | DELAYED_RELEASE_TABLET | Freq: Once | ORAL | Status: AC
  Administered 2023-04-08: 10 mg via ORAL
  Filled 2023-04-08: qty 2

## 2023-04-08 MED ORDER — TIZANIDINE HCL 4 MG PO TABS
4.0000 mg | ORAL_TABLET | Freq: Four times a day (QID) | ORAL | Status: DC | PRN
  Administered 2023-04-08 – 2023-04-09 (×3): 4 mg via ORAL
  Filled 2023-04-08 (×3): qty 1

## 2023-04-08 NOTE — Telephone Encounter (Signed)
Pharmacy Patient Advocate Encounter  Insurance verification completed.    The patient is insured through [employer].     Ran test claim for Jardiance and the current 30 day co-pay is $135.12.   Ran test claim for Entresto and the current 30 day co-pay is $152.12.   This test claim was processed through [facility]- copay amounts may vary at other pharmacies due to pharmacy/plan contracts, or as the patient moves through the different stages of their insurance plan.

## 2023-04-08 NOTE — Progress Notes (Signed)
Heart Failure Navigator Progress Note  Assessed for Heart & Vascular TOC clinic readiness.  Patient does not meet criteria due to EF 50-55%, Has a [facility] appointment on 04/11/23, No HF TOC per Dr. Ella Jubilee. .   Navigator will sign off at this time.   Rhae Hammock, BSN, Scientist, clinical (histocompatibility and immunogenetics) Only

## 2023-04-08 NOTE — Progress Notes (Signed)
Agree with assessment and plan as outlined.  

## 2023-04-08 NOTE — Progress Notes (Signed)
Occupational Therapy Treatment Patient Details Name: Breanna Webster MRN: [id number] DOB: [birth year] Today's Date: 04/08/2023   History of present illness 77 y.o. female with medical history significant of history of atrial fibrillation with RVR, prior strep bacteremia, CAD with stent to RCA in 2019, carotid artery stenosis with history of acute cerebellar stroke in 2020 and an embolic right posterior cerebral artery stroke in 2022, HFpEF, diverticulosis with prior diverticulitis, GERD, Graves' disease with posttreatment hypothyroidism, total shoulder replacement on the left, dyslipidemia, hypertension, parkinsonian symptoms, status post TAVR, type 2 diabetes.  Patient adm 12/12 with reports of shortness of breath.  Acute on Chronic HF.   OT comments  Pt continues to report dizziness with activity and benefits from breathing cues during activity d/t anxiety. However, pt able to manage mobility in room with handheld assist to simulate cane use at home. Pt able to manage ADLs standing at sink and toileting hygiene though requires assist for LB dressing. Pt may benefit from AE education in next session to maximize independence and decrease caregiver fatigue as well. DC recs remain appropriate.      If plan is discharge home, recommend the following:  Assist for transportation;Assistance with cooking/housework;A little help with walking and/or transfers;A little help with bathing/dressing/bathroom   Equipment Recommendations  BSC/3in1    Recommendations for Other Services      Precautions / Restrictions Precautions Precautions: Fall Restrictions Weight Bearing Restrictions Per Provider Order: No       Mobility Bed Mobility Overal bed mobility: Needs Assistance Bed Mobility: Supine to Sit, Sit to Supine     Supine to sit: Mod assist, HOB elevated, Used rails Sit to supine: Min assist        Transfers Overall transfer level: Needs assistance Equipment used: None Transfers: Sit  to/from Stand Sit to Stand: Min assist, Contact guard assist                 Balance Overall balance assessment: Needs assistance Sitting-balance support: Feet supported Sitting balance-Leahy Scale: Fair     Standing balance support: No upper extremity supported, During functional activity, Single extremity supported Standing balance-Leahy Scale: Fair                             ADL either performed or assessed with clinical judgement   ADL Overall ADL's : Needs assistance/impaired     Grooming: Contact guard assist;Standing;Oral care Grooming Details (indicate cue type and reason): use of one UE for support on sink, able to stand for duration of task             Lower Body Dressing: Moderate assistance;Sit to/from stand Lower Body Dressing Details (indicate cue type and reason): requested to change into new depends to feel clean. assisted to doff pants as pt declined to attempt reporting "i cant". once doffed pants/socks, pt realized only one depends left so deferred changing and requested OT to don pants on again. mentioned AE my be worth trying Toilet Transfer: Minimal assistance;Ambulation;BSC/3in1 Statistician Details (indicate cue type and reason): handheld assist from sink to [facility] on other side of room (offered toilet but pt declined) Toileting- Clothing Manipulation and Hygiene: Contact guard assist;Sitting/lateral lean;Sit to/from stand Toileting - Clothing Manipulation Details (indicate cue type and reason): able to manage clothing and hygiene with close guarding though no overt LOB     Functional mobility during ADLs: Minimal assistance      Extremity/Trunk Assessment Upper Extremity Assessment Upper  Extremity Assessment: Overall WFL for tasks assessed;LUE deficits/detail;Right hand dominant LUE Deficits / Details: Limited AROM post TSA   Lower Extremity Assessment Lower Extremity Assessment: Defer to PT evaluation        Vision   Vision  Assessment?: No apparent visual deficits   Perception     Praxis      Cognition Arousal: Alert Behavior During Therapy: WFL for tasks assessed/performed, Anxious Overall Cognitive Status: Within Functional Limits for tasks assessed                                          Exercises      Shoulder Instructions       General Comments      Pertinent Vitals/ Pain       Pain Assessment Pain Assessment: Faces Faces Pain Scale: Hurts little more Pain Location: Back Pain Descriptors / Indicators: Grimacing, Guarding, Sore Pain Intervention(s): Monitored during session, Repositioned  Home Living                                          Prior Functioning/Environment              Frequency  Min 1X/week        Progress Toward Goals  OT Goals(current goals can now be found in the care plan section)  Progress towards OT goals: Progressing toward goals  Acute Rehab OT Goals Patient Stated Goal: reduce anxiety attacks OT Goal Formulation: With patient Time For Goal Achievement: 04/19/23 Potential to Achieve Goals: Good ADL Goals Pt Will Perform Grooming: with modified independence;standing Pt Will Perform Upper Body Dressing: sitting;with set-up Pt Will Perform Lower Body Dressing: with min assist;sit to/from stand Pt Will Transfer to Toilet: with modified independence;regular height toilet;ambulating Pt/caregiver will Perform Home Exercise Program: Increased strength;Both right and left upper extremity;With theraband;With Supervision;With written HEP provided  Plan      Co-evaluation                 AM-PAC OT "6 Clicks" Daily Activity     Outcome Measure   Help from another person eating meals?: None Help from another person taking care of personal grooming?: A Little Help from another person toileting, which includes using toliet, bedpan, or urinal?: A Little Help from another person bathing (including washing,  rinsing, drying)?: A Lot Help from another person to put on and taking off regular upper body clothing?: A Little Help from another person to put on and taking off regular lower body clothing?: A Lot 6 Click Score: 17    End of Session Equipment Utilized During Treatment: Gait belt  OT Visit Diagnosis: Unsteadiness on feet (R26.81);Muscle weakness (generalized) (M62.81);History of falling (Z91.81);Low vision, both eyes (H54.2);Pain   Activity Tolerance Patient tolerated treatment well;Patient limited by pain   Patient Left in bed;with call bell/phone within reach   Nurse Communication Mobility status;Other (comment) (need for new icepack)        Time: 0865-7846 OT Time Calculation (min): 31 min  Charges: OT General Charges $OT Visit: 1 Visit OT Treatments $Self Care/Home Management : 23-37 mins  Bradd Canary, OTR/L Acute Rehab Services Office: [phone]   Lorre Munroe 04/08/2023, 11:43 AM

## 2023-04-08 NOTE — Consult Note (Addendum)
[facility] Liaison Consult Note    04/08/2023  Breanna Webster Sep 24, 1945 [id number]  Addendum:  04/09/23 12:35 pm   Met with patient at bedside, sitting on side of bed eating lunch.  Explained reason for rounding visit.  Patient states she is for returning home with home health. Explained anticipated post hospital follow up calls.  Patient states she doesn't know her medications that well. Explained that the nurse is to review her medications before leaving. Inpatient [facility] RN aware.  Insurance: Medicare ACO REACH  Primary Care Provider: Shirline Frees, NP, [place] [facility] this provider is listed for the transition of care follow up appointments  and Transition of Care calls   [facility] Liaison met patient at bedside at [facility]. Patient asleep on rounds.  Reviewed for high risk and barriers for post hospital care needs.   The patient was screened for 30 day readmission hospitalization with noted high risk score for unplanned readmission risk 2 ED visits and 2 hospital admissions in 6 months.  The patient was assessed for potential Community Care Coordination service needs for post hospital transition for care coordination. Review of patient's electronic medical record reveals patient is admitted with HF and with a past history with [facility] noted.  Plan: [facility] Liaison will continue to follow progress with unit rounds, progression meeting and disposition to asess for post hospital community care coordination/management needs.  Referral request for community care coordination: following however, anticipate follow up with the Community [facility] RN if returning home.   [facility] Community Care, Population Health does not replace or interfere with any arrangements made by the Inpatient Transition of Care team.   For questions contact:   Charlesetta Shanks, RN, BSN, CCM [place]  [facility], [facility] Liaison Direct Dial:  [phone] or secure chat Email: [email or web address]

## 2023-04-08 NOTE — Progress Notes (Addendum)
Patient Name: Breanna Webster Date of Encounter: 04/08/2023 [facility] Cardiologist: Parke Poisson, MD   Interval Summary  .    Patient resting comfortably in bed this morning. Subjectively reports dyspnea, says she breaths okay as long as she's on O2 (on 2LPM this morning). Breathing is better than at time of admission. No chest pain reported. No palpitations reported.   Vital Signs .    Vitals:   04/07/23 2048 04/08/23 0015 04/08/23 0500 04/08/23 0608  BP: 112/61 (!) 128/56  (!) 147/83  Pulse: 77 75  84  Resp: 20 20  18   Temp: 98.1 F (36.7 C) 98 F (36.7 C)  97.7 F (36.5 C)  TempSrc: Oral Oral  Oral  SpO2: 100% 98%  96%  Weight:   65.4 kg   Height:        Intake/Output Summary (Last 24 hours) at 04/08/2023 1012 Last data filed at 04/08/2023 1610 Gross per 24 hour  Intake 477 ml  Output 1625 ml  Net -1148 ml      04/08/2023    5:00 AM 04/07/2023    5:45 AM 04/04/2023    8:39 PM  Last 3 Weights  Weight (lbs) 144 lb 2.9 oz 132 lb 15 oz 176 lb  Weight (kg) 65.4 kg 60.3 kg 79.833 kg      Telemetry/ECG    Sinus rhythm - Personally Reviewed  Physical Exam .   GEN: No acute distress.   Neck: No JVD Cardiac: RRR, no murmurs, rubs, or gallops.  Respiratory: Clear to auscultation bilaterally. GI: Soft, nontender, non-distended  MS: No edema  Assessment & Plan .     Breanna Webster is a 77 y.o. female with a hx of hypertension, hyperlipidemia, HFpEF, CVA, Graves' disease, diabetes, CAD status post PCI with DES x 1 to mid RCA, mitral valve endocarditis 6/20, paroxysmal atrial fibrillation, iron deficiency anemia, severe aortic stenosis status post TAVR and pulmonary hypertension who is being seen for the evaluation of CHF at the request of Dr Jerral Ralph.   Acute on Chronic HFpEF  Patient presented with worsening dyspnea along with LE edema. CT chest with ground glass opacities, trace bilateral pleural effusions. BNP elevated to 401. With IV  diuresis, patient now net negative 8.7 L. TTE this admission shows slightly decreased LVEF of 50-55% with grade II DD. Patient appears to have been taking only 20mg  Torsemide at home, though Rx is for 40mg .   Patient transitioned from IV lasix 40mg  BID to once daily yesterday with creatinine bump the day prior. Creatinine back to baseline, today 1.2->.98.  Euvolemic appearing on physical exam today, though patient subjectively reports ongoing dyspnea (though improved from admission).  Home Rx has been Torsemide 40mg  though patient only taking 20mg . Given stable renal function on equivalent dose IV lasix yesterday, would recommend PO torsemide 40mg  starting tomorrow morning.  Continue Jardiance 10mg  (started this admission).  Start Spironolactone 12.5mg .    CAD s/p PCI to RCA  Patient without chest pain this admission. Troponin 32->32.  Continue Lipitor (newly initiated this admission). Had previously been on Repatha though this was not renewed. Needs repeat LFTs and lipid panel in 8 weeks. Plan to resume Repatha at d/c and stop Atorvastatin as patient previously had myalgia intolerance to this.  No ASA with Eliquis.   Aortic stenosis s/p TAVR  March 2024 TTE with stable valve function. Repeat TTE this admission confirms stable valve function.   Paroxysmal atrial fibrillation  Patient maintaining NSR this admission.  Continue Diltiazem and Eliquis.   Hypothyroidism  Per primary team.  DM type II  Per primary team.    For questions or updates, please contact [facility] Please consult [email or web address] for contact info under        Signed, Perlie Gold, PA-C    ATTENDING ATTESTATION:  After conducting a review of all available clinical information with the care team, interviewing the patient, and performing a physical exam, I agree with the findings and plan described in this note.   GEN:  Moderate distress due to back pain.   HEENT:  MMM, no JVD, no scleral  icterus Cardiac: RRR, no murmurs, rubs, or gallops.  Respiratory: Clear to auscultation bilaterally. GI: Soft, nontender, non-distended  MS: No edema; No deformity. Neuro:  Nonfocal  Vasc:  +2 radial pulses  Patient with fairly severe back pain today.  This has been a chronic issue for her.  Pain more severe starting this AM.  Continue current therapy.  Consider RHC prior to discharge.  Alverda Skeans, MD Pager [phone]

## 2023-04-08 NOTE — Progress Notes (Signed)
Subjective:    Patient ID: Breanna Webster, female    DOB: 07-22-1945, 77 y.o.   MRN: [id number]  HPI Breanna Webster is a pleasant 77 year old female, established with Dr. Adela Lank who was last seen in the office in August 2024.  She had called about a week prior to this appointment complaining of feeling poorly over the previous week.  She did have an overnight admission 12/1 through 03/25/2023 secondary to acute worsening of lower back pain, and setting of hypothyroidism, congestive heart failure with preserved EF, atrial fibrillation, chronic anxiety, sleep apnea with BiPAP use, left hemiparesis, Parkinson's disease and insulin-dependent diabetes mellitus.  She also has history of pulmonary hypertension, severe aortic stenosis now status post TAVR and history of cirrhosis.  She has been followed by GI for cirrhosis felt secondary to NASH, chronic GERD and chronic constipation. Patient has had a lot of medical issues over the past couple of years, status post CVA.  Her husband relates that she had shoulder surgery about 11 months ago and has not been well since.  She has been having to sleep in a recliner ever since and does not sleep well at all.  Over the past couple of weeks she has developed new symptoms of headaches which seem to come on after eating and nausea that is associated postprandially.  She is not having vomiting.  She has not had any fever or chills.  Not really having any abdominal pain, no changes in bowel habits.  Her headaches have been posterior.  She has noticed some increased belching and burping.  Also with constipation over the past couple of days, they have been using MiraLAX on a as needed basis. She is scheduled for CT of the head later this week, and is being followed by her neurologist. She is up to date with colonoscopy last done in November 2023 with 6 polyps removed to these were combination of tubular adenomas and hyperplastic polyps.  Random biopsies were done and negative EGD  in November 2023 showed prior Nissen fundoplication no varices, short segment Barrett's, stomach within normal limits. Soft GL biopsy did not show any evidence of intestinal metaplasia to suggest Barrett's esophagus.  She has not been using anything for nausea at home, definitely feels that the nausea and the headaches come together but has a difficult time describing duration etc. she has.  It sounds as if she is having symptoms off and on throughout the day.- Have CT angio of the chest abdomen pelvis on 03/25/2023-no focal liver abnormality, status postcholecystectomy, no other acute findings in the abdomen, mesenteric vessels patent, no evidence of PE, 5 mm solid pulmonary nodule.      Review of Systems Pertinent positive and negative review of systems were noted in the above HPI section.  All other review of systems was otherwise negative.   No facility-administered encounter medications on file as of 04/04/2023.   Outpatient Encounter Medications as of 04/04/2023  Medication Sig   acetaminophen (TYLENOL) 500 MG tablet Take 1,000 mg by mouth every 6 (six) hours as needed for moderate pain (pain score 4-6).   allopurinol (ZYLOPRIM) 100 MG tablet Take 1 tablet (100 mg total) by mouth daily.   amitriptyline (ELAVIL) 25 MG tablet Take 1 tablet (25 mg total) by mouth at bedtime.   apixaban (ELIQUIS) 5 MG TABS tablet Take 1 tablet (5 mg total) by mouth 2 (two) times daily.   b complex vitamins capsule Take 1 capsule by mouth daily.   Cholecalciferol (D3 PO)  Take 1 tablet by mouth daily.   colchicine 0.6 MG tablet Take 1 tablet (0.6 mg total) by mouth daily. as needed for gout   diltiazem (CARTIA XT) 120 MG 24 hr capsule Take 1 capsule (120 mg total) by mouth daily.   Insulin NPH, Human,, Isophane, (HUMULIN N KWIKPEN) 100 UNIT/ML Kiwkpen Inject 20 units into the skin every morning and 14 units every evening.   insulin regular (HUMULIN R) 100 units/mL injection Inject 0.1 mLs (10 Units total)  into the skin 3 (three) times daily before meals (Patient taking differently: Inject 10-12 Units into the skin 3 (three) times daily. Sliding scale based on testing number)   metFORMIN (GLUCOPHAGE-XR) 500 MG 24 hr tablet Take 1 tablet by mouth 2 times daily   Multiple Vitamin (MULITIVITAMIN WITH MINERALS) TABS Take 1 tablet by mouth daily.   nitroGLYCERIN (NITROSTAT) 0.4 MG SL tablet Place 1 tablet (0.4 mg total) under the tongue every 5 (five) minutes as needed for chest pain.   omeprazole (PRILOSEC) 20 MG capsule Take 1 capsule (20 mg total) by mouth daily.   ondansetron (ZOFRAN) 4 MG tablet Take 1 tablet (4 mg total) by mouth every 8 (eight) hours as needed for nausea or vomiting.   polyethylene glycol (MIRALAX) 17 g packet Take 17 g by mouth daily as needed.   potassium chloride SA (KLOR-CON M) 20 MEQ tablet Take 1 tablet (20 mEq total) by mouth daily. Take in concurrence with torsemide.   SYNTHROID 137 MCG tablet Take 1 tablet (137 mcg total) by mouth every morning on empty stomach.   tiZANidine (ZANAFLEX) 4 MG tablet Take 1 tablet (4 mg total) by mouth 3 (three) times daily. (Patient taking differently: Take 4 mg by mouth daily as needed for muscle spasms.)   torsemide (DEMADEX) 20 MG tablet Take 2 tablets (40 mg total) by mouth daily. (Patient taking differently: Take 20 mg by mouth daily.)   Vibegron (GEMTESA) 75 MG TABS Take 75 mg by mouth at bedtime.   vitamin E 180 MG (400 UNITS) capsule Take 400 Units by mouth daily.   [DISCONTINUED] Insulin Syringe-Needle U-100 (ULTICARE INSULIN SYRINGE) 31G X 5/16" 1 ML MISC Inject 4 times daily subcutaneously   [DISCONTINUED] omeprazole (PRILOSEC) 20 MG capsule Take 1 capsule (20 mg total) by mouth daily. (Patient not taking: Reported on 04/05/2023)   [DISCONTINUED] simvastatin (ZOCOR) 10 MG tablet Take 2 tablets (20 mg total) by mouth daily. (Patient not taking: Reported on 04/05/2023)   ALPRAZolam (XANAX) 0.5 MG tablet Take one tablet 30-60 minutes  before MRI. Can take a second tablet if needed right before MRI (Patient not taking: Reported on 04/04/2023)   No Known Allergies Patient Active Problem List   Diagnosis Date Noted   AKI (acute kidney injury) (HCC) 04/07/2023   Depression 04/06/2023   Acute on chronic diastolic heart failure (HCC) 04/05/2023   Low back pain 03/25/2023   Intractable pain 10/16/2022   Type 2 diabetes mellitus with other specified complication, with long-term current use of insulin (HCC) 07/31/2022   Lymphedema of arm 07/31/2022   Preoperative cardiovascular examination 02/09/2022   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HCC) 10/25/2021   Left hemiparesis (HCC) 06/21/2021   Other specified hereditary hemolytic anemias (HCC) 06/21/2021   Parkinsonism (HCC) 06/21/2021   COVID-19 02/14/2021   Postviral fatigue syndrome 02/14/2021   Difficulty with adaptive servo-ventilation (ASV) use 02/14/2021   Sepsis secondary to UTI (HCC) 09/08/2020   Elevated ALT measurement 09/08/2020   History of CVA with residual deficit  09/08/2020   Hyperbilirubinemia 09/08/2020   Fatigue associated with anemia 08/03/2020   OSA treated with BiPAP 08/03/2020   Treatment-emergent central sleep apnea 08/03/2020   Chronic intermittent hypoxia with obstructive sleep apnea 04/21/2020   Gait disturbance, post-stroke 03/29/2020   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 (HCC) 03/29/2020   Anxiety    RLQ abdominal pain 10/22/2019   Paroxysmal atrial fibrillation (HCC) 05/22/2019   IDA (iron deficiency anemia) 05/07/2019   Streptococcal endocarditis    Endocarditis of mitral valve 10/07/2018   Severe aortic stenosis 07/08/2018   S/P TAVR (transcatheter aortic valve replacement) 07/08/2018   Esophageal thickening    Atherosclerotic heart disease of native coronary artery with other forms of angina pectoris (HCC) 04/22/2018   Gastroesophageal reflux disease    Pulmonary hypertension (HCC) 02/21/2018   Essential hypertension  07/15/2017   History of colonic polyps 05/22/2017   Elevated liver function tests 12/05/2016   DOE (dyspnea on exertion) 07/19/2016   Thalassemia minor 05/29/2016   Left bundle branch block 12/06/2015   Upper airway cough syndrome 10/14/2015   (HFpEF) heart failure with preserved ejection fraction (HCC) 10/10/2015   Vitamin D deficiency 03/10/2009   Hypothyroidism 12/13/2008   Other specified disorders of bladder 12/13/2008   Social History   Socioeconomic History   Marital status: Married    Spouse name: Not on file   Number of children: 2   Years of education: Not on file   Highest education level: Associate degree: academic program  Occupational History   Occupation: Health and safety inspector of the [employer]  Tobacco Use   Smoking status: Never    Passive exposure: Never   Smokeless tobacco: Never  Vaping Use   Vaping status: Never Used  Substance and Sexual Activity   Alcohol use: No    Alcohol/week: 0.0 standard drinks of alcohol   Drug use: Never   Sexual activity: Not Currently  Other Topics Concern   Not on file  Social History Narrative   Lives with husband in a one story home.     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   Education: college.   Social Drivers of Health   Financial Resource Strain: Patient Declined (12/09/2022)   Overall Financial Resource Strain (CARDIA)    Difficulty of Paying Living Expenses: Patient declined  Food Insecurity: No Food Insecurity (04/05/2023)   Hunger Vital Sign    Worried About Running Out of Food in the Last Year: Never true    Ran Out of Food in the Last Year: Never true  Transportation Needs: No Transportation Needs (04/05/2023)   PRAPARE - Administrator, Civil Service (Medical): No    Lack of Transportation (Non-Medical): No  Physical Activity: Unknown (12/09/2022)   Exercise Vital Sign    Days of Exercise per Week: Patient declined     Minutes of Exercise per Session: 40 min  Stress: No Stress Concern Present (12/09/2022)   [employer] of Occupational Health - Occupational Stress Questionnaire    Feeling of Stress : Only a little  Social Connections: Unknown (12/09/2022)   Social Connection and Isolation Panel [NHANES]    Frequency of Communication with Friends and Family: More than three times a week    Frequency of Social Gatherings with Friends and Family: Patient declined    Attends Religious Services: Patient declined    Database administrator or Organizations: Patient declined    Attends [employer]  or Organization Meetings: More than 4 times per year    Marital Status: Married  Catering manager Violence: Not At Risk (04/05/2023)   Humiliation, Afraid, Rape, and Kick questionnaire    Fear of Current or Ex-Partner: No    Emotionally Abused: No    Physically Abused: No    Sexually Abused: No    Ms. Study's family history includes Headache in an other family member; Healthy in her son; Heart failure in her mother. She was adopted.      Objective:    Vitals:   04/04/23 1350  BP: 110/64  Pulse: 88    Physical Exam Well-developed well-nourished somewhat chronically ill-appearing older white female, in wheelchair, in no acute distress.  Accompanied by her husband  Weight, 144 BMI 23.99  HEENT; nontraumatic normocephalic, EOMI, PE R LA, sclera anicteric. Oropharynx; not examined today Neck; supple, no JVD Cardiovascular; regular rate and rhythm with S1-S2, no murmur rub or gallop Pulmonary; Clear bilaterally Abdomen; soft, nontender, nondistended, no appreciable ascites or fluid wave no palpable mass or hepatosplenomegaly, bowel sounds are active Rectal; not done today Skin; benign exam, no jaundice rash or appreciable lesions Extremities; no clubbing cyanosis or edema skin warm and dry Neuro/Psych; alert and oriented x4, grossly nonfocal mood and affect appropriate , anxious,       Assessment &  Plan:   #72 77 year old female with multiple significant comorbidities now with new symptom of postprandial nausea without vomiting over the past 2 to 3 weeks, the symptoms have also been associated with headaches which seem to be more prominent to her postprandially. Recent CT imaging of the abdomen unremarkable Etiology of the nausea is not clear, however with new headaches which seem to be associated I am more concerned about an underlying neurologic etiology  #2 cirrhosis secondary to Nash/MASH-due for follow-up ultrasound/labs #3 last EGD November 2023 no evidence of esophageal varices Before history of Barrett's esophagus with last EGD November 2023 showing no evidence of intestinal metaplasia to suggest Barrett's. #4 constipation-stable #5 Parkinson's disease #6.  Status post CVA with left hemiparesis #7.  History of aortic stenosis status post TAVR #8.  Atrial fibrillation #9.  Hypertension #10.  GERD #11.  Sleep apnea with BiPAP use #12 anxiety  Plan; Start Zofran 4 mg advised her husband to give her 1 in the morning, and then if she is needs another dose 6 to 8 hours later repeat Continue omeprazole 20 mg p.o. every morning Advised to use MiraLAX 17 g in 8 ounces of water on a daily basis. Will schedule for upper abdominal ultrasound for cirrhosis surveillance Check AFP/INR/pro time/CBC and c-Met today Await pending neurowork-up If neurowork-up is all unremarkable and she continues to have significant nausea, I have asked them to call back and speak to Dr. Lanetta Inch nurse and we can discuss further workup     Laquinton Bihm Oswald Hillock PA-C 04/08/2023   Cc: Shirline Frees, NP

## 2023-04-08 NOTE — Progress Notes (Addendum)
Progress Note   Patient: Breanna Webster EPP:[id number] DOB: 05-Jul-1945 DOA: 04/04/2023     3 DOS: the patient was seen and examined on 04/08/2023   Brief hospital course: Breanna Webster was admitted to the hospital with the working diagnosis of heart failure exacerbation.   77 yof w/ CHFpEF OSA onl CPAP, presented with 1 week of progressive shortness of breath.  She was reported to been instructed by her PCP and outpatient cardiologist to increase her outpatient dose of torsemide 40 mg p.o. daily, but she acknowledges she has not yet done this. Because of worsening symptoms EMS was called, she was found in distress and was transported to the ED. On her initial physical examination her blood pressure was 123/69, HR 88, RR 27 and 02 saturation 97%, lungs with rales bilaterally with no wheezing, heart with S1 and S2 present and regular with no gallops, rubs or murmurs, abdomen with no distention and no lower extremity edema.   Chest radiograph with hyperinflation, bilateral mild vascular hilar congestion, no effusions or infiltrates. Fluid in the right fissure per lateral film.   EKG 88 bpm, left axis deviation, left bundle branch block, qtc 546, sinus rhythm with no significant ST segment or T wave changes.   Patient was placed on furosemide for diuresis.   12/15 patient is responding well to diuresis.  12/16 continue to improve volume status and transitioned to loop diuretic therapy.   Assessment and Plan: * Acute on chronic diastolic heart failure (HCC) Echocardiogram (limited acoustic windows), LV systolic function preserved with EF 50 to 55%, mild LVH, RV systolic function preserved, LA with moderate dilatation, mild mitral valve regurgitation, aortic valve (replaced/ repaired)   Urine output is 2,350 ml Systolic blood pressure 120 mmHg range.   Continue SGLT 2 inh and added spironolactone.  Transitioned to oral loop diuretic with torsemide 40 mg po daily.   Essential  hypertension Continue blood pressure monitoring.  If renal function stable plan to add ARB prior to her discharge.   Paroxysmal atrial fibrillation (HCC) Continue rate control with diltiazem and anticoagulation with apixaban.  Continue telemetry monitoring.   AKI (acute kidney injury) (HCC) Hyponatremia. Hypokalemia.   Renal function with serum cr at 0,98 with K at 3,6 and serum bicarbonate at 24  Na 136 and Mg 2.3   Plan to continue diuresis with torsemide and spironolactone Continue K correction with Kcl.  Follow up renal function and electrolytes in am.   Parkinsonism (HCC) Follow up as outpatient.  PT and OT evaluation.   Type 2 diabetes mellitus with other specified complication, with long-term current use of insulin (HCC) Continue glucose cover and monitoring with insulin sliding scale.  Basal insulin with NPH Close follow up capillary glucose  Fasting glucose today 116 mg/dl.   Depression Continue with amitriptyline and tizanidine.   Acute on Chronic back pain.  Will add tramadol as needed for sever pain and change tizanidine to every 6 hrs as needed for muscle spasms.  Add acetaminophen 650 mg scheduled q6 hrs.  Add hydroxyzine for anxiety.    Hypothyroidism Continue levothyroxine         Subjective: Today patient with improvement in her dyspnea and edema. Positive back pain with movement and one episode of severe anxiety   Physical Exam: Vitals:   04/08/23 0500 04/08/23 0608 04/08/23 0900 04/08/23 1200  BP:  (!) 147/83  115/80  Pulse:  84  85  Resp:  18  18  Temp:  97.7 F (36.5 C)  97.7 F (36.5 C)  TempSrc:  Oral  Oral  SpO2:  96% 99% 96%  Weight: 65.4 kg     Height:       Neurology awake and alert ENT with mild pallor Cardiovascular with S1 and S2 present and regular with no gallops, rubs or murmurs Respiratory with mild rales at bases with no wheezing Abdomen with no distention  No lower extremity edema  Data Reviewed:    Family  Communication: I spoke with patient's hudband at the bedside, we talked in detail about patient's condition, plan of care and prognosis and all questions were addressed.   Disposition: Status is: Inpatient Remains inpatient appropriate because: diuresis   Planned Discharge Destination: Home     Author: Coralie Keens, MD 04/08/2023 4:27 PM  For on call review [email or web address].

## 2023-04-08 NOTE — Progress Notes (Signed)
Physical Therapy Treatment Patient Details Name: Breanna Webster MRN: [id number] DOB: 04-Jul-1945 Today's Date: 04/08/2023   History of Present Illness 77 y.o. female with medical history significant of history of atrial fibrillation with RVR, prior strep bacteremia, CAD with stent to RCA in 2019, carotid artery stenosis with history of acute cerebellar stroke in 2020 and an embolic right posterior cerebral artery stroke in 2022, HFpEF, diverticulosis with prior diverticulitis, GERD, Graves' disease with posttreatment hypothyroidism, total shoulder replacement on the left, dyslipidemia, hypertension, parkinsonian symptoms, status post TAVR, type 2 diabetes.  Patient adm 12/12 with reports of shortness of breath.  Acute on Chronic HF.    PT Comments  Pt making progress with mobility and able to amb short distance in hallway with assistance and walker. Less anxious this PM after medication given earlier. Recommend home with husband assistance.     If plan is discharge home, recommend the following: A little help with walking and/or transfers;Assist for transportation;Assistance with cooking/housework   Can travel by private vehicle        Equipment Recommendations  None recommended by PT    Recommendations for Other Services       Precautions / Restrictions Precautions Precautions: Fall Restrictions Weight Bearing Restrictions Per Provider Order: No     Mobility  Bed Mobility Overal bed mobility: Needs Assistance Bed Mobility: Supine to Sit, Sit to Supine     Supine to sit: Min assist, HOB elevated, Used rails Sit to supine: Min assist   General bed mobility comments: Assist to elevate trunk into sitting and bring hips to EOb. Assist to bring legs back up into bed    Transfers Overall transfer level: Needs assistance Equipment used: Rolling walker (2 wheels) Transfers: Sit to/from Stand Sit to Stand: Contact guard assist           General transfer comment: Assist for  safety    Ambulation/Gait Ambulation/Gait assistance: Contact guard assist Gait Distance (Feet): 60 Feet Assistive device: Rolling walker (2 wheels) Gait Pattern/deviations: Step-through pattern, Decreased step length - right, Decreased step length - left, Shuffle, Trunk flexed Gait velocity: decr Gait velocity interpretation: <1.31 ft/sec, indicative of household ambulator   General Gait Details: Assist for safety. Verbal cues to stay closer to walker   Stairs             Wheelchair Mobility     Tilt Bed    Modified Rankin (Stroke Patients Only)       Balance Overall balance assessment: Needs assistance Sitting-balance support: Feet supported Sitting balance-Leahy Scale: Fair     Standing balance support: No upper extremity supported, During functional activity, Single extremity supported Standing balance-Leahy Scale: Fair                              Cognition Arousal: Alert Behavior During Therapy: WFL for tasks assessed/performed, Anxious Overall Cognitive Status: Within Functional Limits for tasks assessed                                          Exercises      General Comments General comments (skin integrity, edema, etc.): VSS. O2 on at 2L      Pertinent Vitals/Pain Pain Assessment Pain Assessment: Faces Faces Pain Scale: Hurts little more Pain Location: Back Pain Descriptors / Indicators: Grimacing, Guarding, Sore Pain Intervention(s): Limited activity within  patient's tolerance, Monitored during session, Premedicated before session, Repositioned, Ice applied    Home Living                          Prior Function            PT Goals (current goals can now be found in the care plan section) Progress towards PT goals: Progressing toward goals    Frequency    Min 1X/week      PT Plan      Co-evaluation              AM-PAC PT "6 Clicks" Mobility   Outcome Measure  Help needed  turning from your back to your side while in a flat bed without using bedrails?: A Little Help needed moving from lying on your back to sitting on the side of a flat bed without using bedrails?: A Little Help needed moving to and from a bed to a chair (including a wheelchair)?: A Little Help needed standing up from a chair using your arms (e.g., wheelchair or bedside chair)?: A Little Help needed to walk in hospital room?: A Little Help needed climbing 3-5 steps with a railing? : A Lot 6 Click Score: 17    End of Session Equipment Utilized During Treatment: Gait belt;Oxygen Activity Tolerance: Patient tolerated treatment well Patient left: in bed;with call bell/phone within reach;with bed alarm set Nurse Communication: Mobility status PT Visit Diagnosis: Other abnormalities of gait and mobility (R26.89)     Time: 1761-6073 PT Time Calculation (min) (ACUTE ONLY): 30 min  Charges:    $Gait Training: 23-37 mins PT General Charges $$ ACUTE PT VISIT: 1 Visit                     [facility] PT Acute Rehabilitation Services Office [phone]    Angelina Ok [facility] 04/08/2023, 4:05 PM

## 2023-04-08 NOTE — Progress Notes (Signed)
Mobility Specialist Progress Note:    04/08/23 1252  Mobility  Activity Moved into chair position in bed (bed mobility)  Level of Assistance Minimal assist, patient does 75% or more  Assistive Device Other (Comment) (HHA)  Activity Response Tolerated fair  Mobility Referral Yes  Mobility visit 1 Mobility  Mobility Specialist Start Time (ACUTE ONLY) 1202  Mobility Specialist Stop Time (ACUTE ONLY) 1212  Mobility Specialist Time Calculation (min) (ACUTE ONLY) 10 min   Pt received sitting EOB requesting assistance getting supine in bed. Pt was very anxious throughout interaction. Needed mod verbal cues to stay focused. C/o back pain and dizziness throughout. RN aware. Was able to swing legs into bed w/ MinA and husband assisted Mobility specialist in pulling patient up in bed. Call bell and personal belongings in reach. All needs met.    Thompson Grayer Mobility Specialist  Please contact vis Secure Chat or  Rehab Office [phone]

## 2023-04-08 NOTE — Care Management Important Message (Signed)
Important Message  Patient Details  Name: Breanna Webster MRN: [id number] Date of Birth: October 27, 1945   Important Message Given:  Yes - Medicare IM     Renie Ora 04/08/2023, 9:15 AM

## 2023-04-09 ENCOUNTER — Other Ambulatory Visit (HOSPITAL_COMMUNITY): Payer: Self-pay

## 2023-04-09 ENCOUNTER — Encounter: Payer: Self-pay | Admitting: Hematology

## 2023-04-09 DIAGNOSIS — I480 Paroxysmal atrial fibrillation: Secondary | ICD-10-CM | POA: Diagnosis not present

## 2023-04-09 DIAGNOSIS — N179 Acute kidney failure, unspecified: Secondary | ICD-10-CM | POA: Diagnosis not present

## 2023-04-09 DIAGNOSIS — I10 Essential (primary) hypertension: Secondary | ICD-10-CM | POA: Diagnosis not present

## 2023-04-09 DIAGNOSIS — I5033 Acute on chronic diastolic (congestive) heart failure: Secondary | ICD-10-CM | POA: Diagnosis not present

## 2023-04-09 LAB — BASIC METABOLIC PANEL
Anion gap: 10 (ref 5–15)
BUN: 24 mg/dL — ABNORMAL HIGH (ref 8–23)
CO2: 24 mmol/L (ref 22–32)
Calcium: 9.6 mg/dL (ref 8.9–10.3)
Chloride: 102 mmol/L (ref 98–111)
Creatinine, Ser: 0.98 mg/dL (ref 0.44–1.00)
GFR, Estimated: 59 mL/min — ABNORMAL LOW (ref >60)
Glucose, Bld: 130 mg/dL — ABNORMAL HIGH (ref 70–99)
Potassium: 3.9 mmol/L (ref 3.5–5.1)
Sodium: 136 mmol/L (ref 135–145)

## 2023-04-09 LAB — MAGNESIUM: Magnesium: 2.4 mg/dL (ref 1.7–2.4)

## 2023-04-09 LAB — GLUCOSE, CAPILLARY
Glucose-Capillary: 143 mg/dL — ABNORMAL HIGH (ref 70–99)
Glucose-Capillary: 130 mg/dL — ABNORMAL HIGH (ref 70–99)

## 2023-04-09 MED ORDER — SPIRONOLACTONE 25 MG PO TABS
12.5000 mg | ORAL_TABLET | Freq: Every day | ORAL | 0 refills | Status: DC
  Filled 2023-04-09: qty 15, 30d supply, fill #0

## 2023-04-09 MED ORDER — ACETAMINOPHEN 500 MG PO TABS
500.0000 mg | ORAL_TABLET | Freq: Four times a day (QID) | ORAL | 0 refills | Status: DC
  Filled 2023-04-09: qty 30, 8d supply, fill #0

## 2023-04-09 MED ORDER — POTASSIUM CHLORIDE CRYS ER 20 MEQ PO TBCR: 10.0000 meq | EXTENDED_RELEASE_TABLET | Freq: Every day | ORAL | Status: DC

## 2023-04-09 MED ORDER — TORSEMIDE 20 MG PO TABS
40.0000 mg | ORAL_TABLET | Freq: Every day | ORAL | 0 refills | Status: DC
  Filled 2023-04-09: qty 60, 30d supply, fill #0

## 2023-04-09 MED ORDER — EMPAGLIFLOZIN 10 MG PO TABS
10.0000 mg | ORAL_TABLET | Freq: Every day | ORAL | 0 refills | Status: DC
  Filled 2023-04-09: qty 30, 30d supply, fill #0

## 2023-04-09 NOTE — Progress Notes (Addendum)
Patient Name: Breanna Webster Date of Encounter: 04/09/2023 [facility] Cardiologist: Parke Poisson, MD   Interval Summary  .    Breathing much improved, continues to have back pain (main complaint)  Vital Signs .    Vitals:   04/08/23 2017 04/09/23 0042 04/09/23 0426 04/09/23 0830  BP:  128/63 137/66 138/65  Pulse: 75 68 76 75  Resp:  18 20 18   Temp:  97.9 F (36.6 C) (!) 97 F (36.1 C) 97.8 F (36.6 C)  TempSrc:  Oral Axillary Oral  SpO2: 100% 97% 98% 100%  Weight:  62.1 kg 62 kg   Height:        Intake/Output Summary (Last 24 hours) at 04/09/2023 0956 Last data filed at 04/09/2023 0800 Gross per 24 hour  Intake 360 ml  Output 400 ml  Net -40 ml      04/09/2023    4:26 AM 04/09/2023   12:42 AM 04/08/2023    5:00 AM  Last 3 Weights  Weight (lbs) 136 lb 11 oz 137 lb 144 lb 2.9 oz  Weight (kg) 62 kg 62.143 kg 65.4 kg      Telemetry/ECG    Sinus Rhythm - Personally Reviewed  Physical Exam .   GEN: No acute distress.   Neck: No JVD Cardiac: RRR, no murmurs, rubs, or gallops.  Respiratory: Clear to auscultation bilaterally. GI: Soft, nontender, non-distended  MS: No edema  Assessment & Plan .     NIKE MONTUFAR is a 77 y.o. female with a hx of hypertension, hyperlipidemia, HFpEF, CVA, Graves' disease, diabetes, CAD status post PCI with DES x 1 to mid RCA, mitral valve endocarditis 6/20, paroxysmal atrial fibrillation, iron deficiency anemia, severe aortic stenosis status post TAVR and pulmonary hypertension who is being seen for the evaluation of CHF at the request of Dr Jerral Ralph.    Acute on Chronic HFpEF -- Patient presented with worsening dyspnea along with LE edema. CT chest with ground glass opacities, trace bilateral pleural effusions. BNP elevated to 401.  -- s/p IV diuresis, patient now net negative 8.6 L -- TTE this admission shows slightly decreased LVEF of 50-55% with grade II DD.  -- Patient appears to have been taking only  20mg  Torsemide at home, though Rx is for 40mg .  -- now back on torsemide 40mg  daily -- Continue Jardiance 10mg , Spironolactone 12.5mg  -- SPEP pending, can plan for outpatient PYP scan   CAD s/p PCI to RCA -- Patient without chest pain this admission. Troponin 32->32. -- Continue Lipitor (newly initiated this admission). Had previously been on Repatha though this was not renewed.  -- Needs repeat LFTs and lipid panel in 8 weeks.  -- would plan to resume Repatha at d/c and stop Atorvastatin as patient previously had myalgia intolerance to this.  -- No ASA with need for Eliquis.    Aortic stenosis s/p TAVR -- March 2024 TTE with stable valve function.  -- Repeat TTE this admission confirmed stable valve function.    Paroxysmal atrial fibrillation -- Patient maintaining NSR this admission. -- Continue Diltiazem and Eliquis.    Per primary team Hypothyroidism DM Back pain   For questions or updates, please contact [facility] Please consult [email or web address] for contact info under        Signed, Laverda Page, NP   I have personally seen and examined this patient. I agree with the assessment and plan as outlined above.  She has diuresed well (negative 8.6 L). Sinus on  telemetry. Continue Torsemide 40 mg daily. OK to be discharged from our perspective. We will arrange close outpatient follow up in our office.   Verne Carrow, MD, [facility] 04/09/2023 10:45 AM

## 2023-04-09 NOTE — Progress Notes (Addendum)
Discharge instructions (including medications) discussed with and copy provided to patient, patient verbalized understanding. PIV removed and patient assisted with dressing. CCMD made aware of dc and monitor removed. Waiting for spouse to come with rest of clothing. Bedside commode delivered to room.  TOC medications delivered to patient room.

## 2023-04-09 NOTE — TOC Transition Note (Addendum)
Transition of Care [facility]) - Discharge Note   Patient Details  Name: Breanna Webster MRN: [id number] Date of Birth: 1945/10/12  Transition of Care [facility]) CM/SW Contact:  Leone Haven, RN Phone Number: 04/09/2023, 12:51 PM   Clinical Narrative:    For dc today, spouse will transport her home at 2 pm today, NCM offered choice to spouse, he chose [place].  NCM made referral to [facility] with [facility].  He is able to take referrral.  Soc will begin 24 to 48 hrs post dc.  Patient states she has no preference for DME agency.  NCM made referral to [facility] with [employer] for [facility], this will be brought up to room prior to dc. BSC already in room.   Final next level of care: Home w Home Health Services Barriers to Discharge: No Barriers Identified   Patient Goals and CMS Choice Patient states their goals for this hospitalization and ongoing recovery are:: return home CMS Medicare.gov Compare Post Acute Care list provided to:: Patient Represenative (must comment) Choice offered to / list presented to : Spouse      Discharge Placement                       Discharge Plan and Services Additional resources added to the After Visit Summary for   In-house Referral: NA Discharge Planning Services: CM Consult Post Acute Care Choice: NA          DME Arranged: Bedside commode DME Agency: [employer] Date DME Agency Contacted: 04/09/23 Time DME Agency Contacted: [phone] Representative spoke with at DME Agency: Vaughan Basta HH Arranged: PT OT RN [facility] Agency: [facility] Date [facility] Agency Contacted: 04/09/23 Time HH Agency Contacted: [phone] Representative spoke with at [facility] Agency: Kandee Keen  Social Drivers of Health (SDOH) Interventions SDOH Screenings   Food Insecurity: No Food Insecurity (04/05/2023)  Housing: Low Risk  (04/05/2023)  Transportation Needs: No Transportation Needs (04/05/2023)  Utilities: Not At Risk (04/05/2023)  Alcohol Screen: Low Risk  (08/29/2022)  Depression  (PHQ2-9): Low Risk  (08/29/2022)  Financial Resource Strain: Patient Declined (12/09/2022)  Physical Activity: Unknown (12/09/2022)  Social Connections: Unknown (12/09/2022)  Stress: No Stress Concern Present (12/09/2022)  Tobacco Use: Low Risk  (04/08/2023)     Readmission Risk Interventions     No data to display

## 2023-04-09 NOTE — Discharge Summary (Addendum)
Physician Discharge Summary   Patient: Breanna Webster MRN: [id number] DOB: [birth year]  Admit date:     04/04/2023  Discharge date: 04/09/23  Discharge Physician: York Ram Eulah Walkup   PCP: Shirline Frees, NP   Recommendations at discharge:    Continue diuresis with torsemide 40 mg po daily, added SGLT 2 inh and spironolactone.  If blood pressure and renal function stable, to consider adding ARB or ACE inh as outpatient.  Acetaminophen reduced to 500 mg every 6 hrs to avoid toxicity.  Follow up with Shirline Frees NP in 7 to 10 days.  Follow up renal function and electrolytes in 7 days as outpatient.  Follow up with Cardiology as scheduled.   Discharge Diagnoses: Principal Problem:   Acute on chronic diastolic heart failure (HCC) Active Problems:   Essential hypertension   Paroxysmal atrial fibrillation (HCC)   AKI (acute kidney injury) (HCC)   Parkinsonism (HCC)   Type 2 diabetes mellitus with other specified complication, with long-term current use of insulin (HCC)   Depression   Hypothyroidism  Resolved Problems:   * No resolved hospital problems. [facility] Course: Mrs. Fleeger was admitted to the hospital with the working diagnosis of heart failure exacerbation.   77 yo with the past medical history of heart failure, OSA onl CPAP, presented with 1 week of progressive shortness of breath.  She was reported to been instructed by her PCP and outpatient cardiologist to increase her outpatient dose of torsemide 40 mg p.o. daily, but she acknowledges she has not yet done this. Because of worsening symptoms EMS was called, she was found in distress and was transported to the ED. On her initial physical examination her blood pressure was 123/69, HR 88, RR 27 and 02 saturation 97%, lungs with rales bilaterally with no wheezing, heart with S1 and S2 present and regular with no gallops, rubs or murmurs, abdomen with no distention and positive lower extremity edema.   Na 143, K  3,9 Cl 111, bicarbonate 22, glucose 113 bun 16 cr 0,69  AST 23 ALT 19  BNP 401.4  High sensitive troponin 48 and 42  Wbc 5.4 hgb 9,9 plt 175   Urine analysis with SG 1,011, negative protein, negative leukocytes and negative hgb.   Chest radiograph with hyperinflation, bilateral mild vascular hilar congestion, no effusions or infiltrates. Fluid in the right fissure per lateral film.   EKG 88 bpm, left axis deviation, left bundle branch block, qtc 546, sinus rhythm with no significant ST segment or T wave changes.   Head CT with no acute changes.  Chest CT with no evidence of pulmonary embolism, bilateral ground glass opacities with bibasilar atelectasis.  Lumbar spine radiograph with degenerative changes of the lumbar spine were noted with no acute abnormalities.  Thoracic spine radiograph with degenerative changes without acute abnormalities.   Patient was placed on furosemide for diuresis.   12/15 patient is responding well to diuresis.  12/16 continue to improve volume status and transitioned to loop diuretic therapy.  12/17 plan to continue diuresis at home with oral torsemide and have follow up as outpatient.   Assessment and Plan: * Acute on chronic diastolic heart failure (HCC) Echocardiogram (limited acoustic windows), LV systolic function preserved with EF 50 to 55%, mild LVH, RV systolic function preserved, LA with moderate dilatation, mild mitral valve regurgitation, aortic valve (replaced/ repaired)   Patient was placed on IV furosemide for diuresis, negative fluid balance was achieved, -8,698 ml, with significant improvement in her symptoms.  Plan to continue SGLT 2 inh and spironolactone.  Diuresis with torsemide 40 mg daily.  Evaluate as outpatient possible addition of ARB.    Essential hypertension Blood pressure has been stable with systolic in the 120 mmHg range. Plan to continue spironolactone. Patient will benefit from ARB or Ace inh as outpatient if blood  pressure stable.   Paroxysmal atrial fibrillation (HCC) Continue rate control with diltiazem and anticoagulation with apixaban.   AKI (acute kidney injury) (HCC) Hyponatremia. Hypokalemia.   Renal function with serum cr at 0,98 with K at 3,9 and serum bicarbonate at 24  Na 136 and Mg 2.4   Plan to continue diuresis with torsemide and spironolactone Follow up renal function and electrolytes as outpatient.   Parkinsonism (HCC) Arranged for home health services.  Follow up as outpatient.   Type 2 diabetes mellitus with other specified complication, with long-term current use of insulin (HCC) Patient was placed on insulin therapy during her hospitalization, sliding scale and basal with good toleration.  At home will continue her insulin regimen along with metformin.    Depression Continue with amitriptyline and tizanidine.   Acute on Chronic back pain.  Patient required tramadol as needed for sever pain and  tizanidine to every 6 hrs as needed for muscle spasms.  Continue with lower dose of acetaminophen 650 mg scheduled q6 hrs, to avoid toxicity.  Had as needed hydroxyzine for anxiety.    Hypothyroidism Continue levothyroxine          Consultants: cardiology  Procedures performed: none   Disposition: Home Diet recommendation:  Discharge Diet Orders (From admission, onward)     Start     Ordered   04/09/23 0000  Diet - low sodium heart healthy        04/09/23 1202           Cardiac and Carb modified diet DISCHARGE MEDICATION: Allergies as of 04/09/2023       Reactions   Statins Other (See Comments)   Tried simvastatin, atorvastatin; on Repatha        Medication List     STOP taking these medications    ALPRAZolam 0.5 MG tablet Commonly known as: Xanax       TAKE these medications    Acetaminophen Extra Strength 500 MG Tabs Take 1 tablet (500 mg total) by mouth 4 (four) times daily. What changed:  how much to take when to take  this reasons to take this   allopurinol 100 MG tablet Commonly known as: ZYLOPRIM Take 1 tablet (100 mg total) by mouth daily.   amitriptyline 25 MG tablet Commonly known as: ELAVIL Take 1 tablet (25 mg total) by mouth at bedtime.   b complex vitamins capsule Take 1 capsule by mouth daily.   colchicine 0.6 MG tablet Take 1 tablet (0.6 mg total) by mouth daily. as needed for gout   D3 PO Take 1 tablet by mouth daily.   diltiazem 120 MG 24 hr capsule Commonly known as: Cartia XT Take 1 capsule (120 mg total) by mouth daily.   Eliquis 5 MG Tabs tablet Generic drug: apixaban Take 1 tablet (5 mg total) by mouth 2 (two) times daily.   Gemtesa 75 MG Tabs Generic drug: Vibegron Take 75 mg by mouth at bedtime.   HumuLIN N KwikPen 100 UNIT/ML KwikPen Generic drug: Insulin NPH (Human) (Isophane) Inject 20 units into the skin every morning and 14 units every evening.   HumuLIN R 100 UNIT/ML injection Generic drug: insulin regular Inject  0.1 mLs (10 Units total) into the skin 3 (three) times daily before meals What changed:  how much to take additional instructions   Jardiance 10 MG Tabs tablet Generic drug: empagliflozin Take 1 tablet (10 mg total) by mouth daily. Start taking on: April 10, 2023   metFORMIN 500 MG 24 hr tablet Commonly known as: GLUCOPHAGE-XR Take 1 tablet by mouth 2 times daily   multivitamin with minerals Tabs tablet Take 1 tablet by mouth daily.   nitroGLYCERIN 0.4 MG SL tablet Commonly known as: NITROSTAT Place 1 tablet (0.4 mg total) under the tongue every 5 (five) minutes as needed for chest pain.   omeprazole 20 MG capsule Commonly known as: PRILOSEC Take 1 capsule (20 mg total) by mouth daily.   ondansetron 4 MG tablet Commonly known as: Zofran Take 1 tablet (4 mg total) by mouth every 8 (eight) hours as needed for nausea or vomiting.   polyethylene glycol 17 g packet Commonly known as: MiraLax Take 17 g by mouth daily as needed.    potassium chloride SA 20 MEQ tablet Commonly known as: KLOR-CON M Take 0.5 tablets (10 mEq total) by mouth daily. Take in concurrence with torsemide. What changed: how much to take   spironolactone 25 MG tablet Commonly known as: ALDACTONE Take 0.5 tablets (12.5 mg total) by mouth daily. Start taking on: April 10, 2023   Synthroid 137 MCG tablet Generic drug: levothyroxine Take 1 tablet (137 mcg total) by mouth every morning on empty stomach.   tiZANidine 4 MG tablet Commonly known as: ZANAFLEX Take 1 tablet (4 mg total) by mouth 3 (three) times daily. What changed:  when to take this reasons to take this   torsemide 20 MG tablet Commonly known as: DEMADEX Take 2 tablets (40 mg total) by mouth daily. What changed: how much to take   vitamin E 180 MG (400 UNITS) capsule Take 400 Units by mouth daily.        Follow-up Information     Nafziger, Kandee Keen, NP Follow up.   Specialty: Family Medicine Contact information: [address] [place] [address] [phone]                Discharge Exam: Filed Weights   04/08/23 0500 04/09/23 0042 04/09/23 0426  Weight: 65.4 kg 62.1 kg 62 kg   BP (!) 123/57 (BP Location: Right Arm)   Pulse 63   Temp 97.8 F (36.6 C) (Oral)   Resp 18   Ht 5\' 5"  (1.651 m)   Wt 62 kg   SpO2 96%   BMI 22.75 kg/m   Patient is feeling better, her back pain is better controlled, she has no dyspnea or PND and lowe extremity edema has resolved.   Neurology awake and alert ENT with mild pallor Cardiovascular with S1 and S2 present and regular with no gallops, ,rubs or murmurs Respiratory with no rales or wheezing, no rhonchi Abdomen with no distention  No lower extremity edema.   Condition at discharge: stable  The results of significant diagnostics from this hospitalization (including imaging, microbiology, ancillary and laboratory) are listed below for reference.   Imaging Studies: ECHOCARDIOGRAM COMPLETE Result Date:  04/06/2023    ECHOCARDIOGRAM REPORT   Patient Name:   ANDRIEL LEONHART Date of Exam: 04/06/2023 Medical Rec #:  [id number]        Height:       65.0 in Accession #:    [id number]       Weight:  176.0 lb Date of Birth:  24-Feb-1946        BSA:          1.874 m Patient Age:    53 years         BP:           121/60 mmHg Patient Gender: F                HR:           63 bpm. Exam Location:  Inpatient Procedure: 2D Echo, Cardiac Doppler, Color Doppler and Intracardiac            Opacification Agent Indications:    CHF I50.31  History:        Patient has prior history of Echocardiogram examinations. HFpEF                 and CHF, CAD, Stroke, Aortic Valve Disease, Arrythmias:LBBB;                 Risk Factors:Non-Smoker, Hypertension and Sleep Apnea. S/P TAVR.                 Aortic Valve: valve is present in the aortic position.  Sonographer:    Dondra Prader RVT RCS Referring Phys: [id number] [facility]  Sonographer Comments: Technically challenging study due to limited acoustic windows, Technically difficult study due to poor echo windows, suboptimal parasternal window, suboptimal apical window and suboptimal subcostal window. IMPRESSIONS  1. Left ventricular ejection fraction, by estimation, is 50 to 55%. The left ventricle has low normal function. The left ventricle has no regional wall motion abnormalities. There is mild concentric left ventricular hypertrophy. Left ventricular diastolic parameters are consistent with Grade II diastolic dysfunction (pseudonormalization). Elevated left atrial pressure.  2. Right ventricular systolic function is normal. The right ventricular size is normal. Tricuspid regurgitation signal is inadequate for assessing PA pressure.  3. Left atrial size was moderately dilated.  4. The mitral valve is degenerative. Mild mitral valve regurgitation. No evidence of mitral stenosis. Severe mitral annular calcification.  5. The aortic valve has been repaired/replaced. Aortic valve regurgitation is  not visualized. There is a valve present in the aortic position. Aortic valve mean gradient measures 10.0 mmHg. Aortic valve Vmax measures 2.16 m/s. Aortic valve acceleration time measures 106 msec.  6. The inferior vena cava is normal in size with greater than 50% respiratory variability, suggesting right atrial pressure of 3 mmHg. Comparison(s): No significant change from prior study. Prior images reviewed side by side. FINDINGS  Left Ventricle: Left ventricular ejection fraction, by estimation, is 50 to 55%. The left ventricle has low normal function. The left ventricle has no regional wall motion abnormalities. Definity contrast agent was given IV to delineate the left ventricular endocardial borders. The left ventricular internal cavity size was normal in size. There is mild concentric left ventricular hypertrophy. Abnormal (paradoxical) septal motion, consistent with left bundle branch block. Left ventricular diastolic parameters are consistent with Grade II diastolic dysfunction (pseudonormalization). Elevated left atrial pressure. Right Ventricle: The right ventricular size is normal. No increase in right ventricular wall thickness. Right ventricular systolic function is normal. Tricuspid regurgitation signal is inadequate for assessing PA pressure. Left Atrium: Left atrial size was moderately dilated. Right Atrium: Right atrial size was normal in size. Pericardium: There is no evidence of pericardial effusion. Mitral Valve: The mitral valve is degenerative in appearance. Severe mitral annular calcification. Mild mitral valve regurgitation, with centrally-directed jet. No evidence of mitral valve stenosis.  Tricuspid Valve: The tricuspid valve is grossly normal. Tricuspid valve regurgitation is not demonstrated. Aortic Valve: The aortic valve has been repaired/replaced. Aortic valve regurgitation is not visualized. Aortic valve mean gradient measures 10.0 mmHg. Aortic valve peak gradient measures 18.6 mmHg.  Aortic valve area, by VTI measures 1.38 cm. There is a  valve present in the aortic position. Pulmonic Valve: The pulmonic valve was not well visualized. Aorta: The aortic root and ascending aorta are structurally normal, with no evidence of dilitation. Venous: The inferior vena cava is normal in size with greater than 50% respiratory variability, suggesting right atrial pressure of 3 mmHg. IAS/Shunts: No atrial level shunt detected by color flow Doppler.  LEFT VENTRICLE PLAX 2D LVIDd:         5.30 cm   Diastology LVIDs:         4.20 cm   LV e' medial:    3.60 cm/s LV PW:         1.30 cm   LV E/e' medial:  32.2 LV IVS:        1.30 cm   LV e' lateral:   6.41 cm/s LVOT diam:     2.10 cm   LV E/e' lateral: 18.1 LV SV:         70 LV SV Index:   38 LVOT Area:     3.46 cm  IVC IVC diam: 1.10 cm LEFT ATRIUM             Index        RIGHT ATRIUM           Index LA diam:        4.90 cm 2.62 cm/m   RA Area:     11.80 cm LA Vol (A2C):   63.6 ml 33.95 ml/m  RA Volume:   29.50 ml  15.75 ml/m LA Vol (A4C):   79.2 ml 42.27 ml/m LA Biplane Vol: 74.2 ml 39.60 ml/m  AORTIC VALVE AV Area (Vmax):    1.41 cm AV Area (Vmean):   1.40 cm AV Area (VTI):     1.38 cm AV Vmax:           215.50 cm/s AV Vmean:          142.000 cm/s AV VTI:            0.510 m AV Peak Grad:      18.6 mmHg AV Mean Grad:      10.0 mmHg LVOT Vmax:         87.90 cm/s LVOT Vmean:        57.200 cm/s LVOT VTI:          0.203 m LVOT/AV VTI ratio: 0.40  AORTA Ao Root diam: 2.20 cm Ao Asc diam:  3.40 cm MITRAL VALVE MV Area (PHT): 3.12 cm     SHUNTS MV Decel Time: 243 msec     Systemic VTI:  0.20 m MV E velocity: 116.00 cm/s  Systemic Diam: 2.10 cm MV A velocity: 135.00 cm/s MV E/A ratio:  0.86 Mihai Croitoru MD Electronically signed by Thurmon Fair MD Signature Date/Time: 04/06/2023/4:00:04 PM    Final    CT Angio Chest PE W and/or Wo Contrast Result Date: 04/05/2023 CLINICAL DATA:  Pulmonary embolism (PE) suspected, low to intermediate prob, positive  D-dimer. Shortness of breath, back pain. Chest pain. EXAM: CT ANGIOGRAPHY CHEST WITH CONTRAST TECHNIQUE: Multidetector CT imaging of the chest was performed using the standard protocol during bolus administration of intravenous contrast. Multiplanar  CT image reconstructions and MIPs were obtained to evaluate the vascular anatomy. RADIATION DOSE REDUCTION: This exam was performed according to the departmental dose-optimization program which includes automated exposure control, adjustment of the mA and/or kV according to patient size and/or use of iterative reconstruction technique. CONTRAST:  65mL OMNIPAQUE IOHEXOL 350 MG/ML SOLN COMPARISON:  03/25/2023 FINDINGS: Cardiovascular: Cardiomegaly. Densely calcified mitral valve. Prior aortic valve repair. Extensive coronary artery and aortic atherosclerosis. No aneurysm. No filling defects in the pulmonary arteries to suggest pulmonary emboli. Mediastinum/Nodes: No mediastinal, hilar, or axillary adenopathy. Trachea and esophagus are unremarkable. Thyroid unremarkable Lungs/Pleura: Vascular congestion. Bibasilar airspace opacities, favor atelectasis although infiltrates cannot be completely excluded. Trace left pleural effusion. Upper Abdomen: No acute findings Musculoskeletal: Chest wall soft tissues are unremarkable. No acute bony abnormality. Review of the MIP images confirms the above findings. IMPRESSION: No evidence of pulmonary embolus. Cardiomegaly, vascular congestion. Coronary artery disease. Ground-glass opacities in the lower lobes/basis, favor atelectasis although infiltrate/pneumonia cannot be completely excluded. Trace left pleural effusion. Aortic Atherosclerosis (ICD10-I70.0). Electronically Signed   By: Charlett Nose M.D.   On: 04/05/2023 02:18   CT Head Wo Contrast Result Date: 04/05/2023 CLINICAL DATA:  Neuro deficit, acute, stroke suspected. Shortness of breath, back pain EXAM: CT HEAD WITHOUT CONTRAST TECHNIQUE: Contiguous axial images were  obtained from the base of the skull through the vertex without intravenous contrast. RADIATION DOSE REDUCTION: This exam was performed according to the departmental dose-optimization program which includes automated exposure control, adjustment of the mA and/or kV according to patient size and/or use of iterative reconstruction technique. COMPARISON:  03/25/2023 FINDINGS: Brain: Diffuse cerebral atrophy. No acute intracranial abnormality. Specifically, no hemorrhage, hydrocephalus, mass lesion, acute infarction, or significant intracranial injury. Vascular: No hyperdense vessel or unexpected calcification. Skull: No acute calvarial abnormality. Sinuses/Orbits: No acute findings Other: None IMPRESSION: No acute intracranial abnormality. Electronically Signed   By: Charlett Nose M.D.   On: 04/05/2023 02:15   DG Lumbar Spine Complete Result Date: 04/05/2023 CLINICAL DATA:  Low back pain, no known injury, initial encounter EXAM: LUMBAR SPINE - COMPLETE 4+ VIEW COMPARISON:  None Available. FINDINGS: Five lumbar type vertebral bodies are well visualized. Vertebral body height is well maintained. Osteophytic changes are noted. Facet hypertrophic changes are seen. No anterolisthesis is noted. IMPRESSION: Degenerative changes of the lumbar spine are noted without acute abnormality. Electronically Signed   By: Alcide Clever M.D.   On: 04/05/2023 00:51   DG Thoracic Spine 4V Result Date: 04/05/2023 CLINICAL DATA:  Upper back pain, initial encounter EXAM: THORACIC SPINE - 4+ VIEW COMPARISON:  None Available. FINDINGS: Vertebral body height is well maintained. No anterolisthesis is noted. Mild osteophytic changes are seen. Changes of prior TAVR are noted. IMPRESSION: Degenerative change without acute abnormality. Electronically Signed   By: Alcide Clever M.D.   On: 04/05/2023 00:49   DG Chest 2 View Result Date: 04/04/2023 CLINICAL DATA:  Shortness of breath EXAM: CHEST - 2 VIEW COMPARISON:  03/24/2023 FINDINGS: Heart  is borderline in size. Mediastinal contours within normal limits. Aortic atherosclerosis. No overt edema or effusions. No acute bony abnormality. IMPRESSION: No active cardiopulmonary disease. Electronically Signed   By: Charlett Nose M.D.   On: 04/04/2023 21:36   CT HEAD WO CONTRAST ( ) Result Date: 03/25/2023 CLINICAL DATA:  Poly trauma, blunt. Dizziness and hypertension for 10 hours. Pain for 3 weeks. EXAM: CT HEAD WITHOUT CONTRAST TECHNIQUE: Contiguous axial images were obtained from the base of the skull through the vertex without intravenous contrast.  RADIATION DOSE REDUCTION: This exam was performed according to the departmental dose-optimization program which includes automated exposure control, adjustment of the mA and/or kV according to patient size and/or use of iterative reconstruction technique. COMPARISON:  03/07/2023 FINDINGS: Brain: Diffuse cerebral atrophy. Ventricular dilatation consistent with central atrophy. Low-attenuation changes in the deep white matter consistent with small vessel ischemia. No abnormal extra-axial fluid collections. No mass effect or midline shift. Gray-white matter junctions are distinct. Basal cisterns are not effaced. No acute intracranial hemorrhage. Vascular: No hyperdense vessel or unexpected calcification. Skull: Normal. Negative for fracture or focal lesion. Sinuses/Orbits: Mild mucosal thickening in the paranasal sinuses. No acute air-fluid levels. Mastoid air cells are clear. Other: None. IMPRESSION: No acute intracranial abnormalities. Chronic atrophy and small vessel ischemic changes. Electronically Signed   By: Burman Nieves M.D.   On: 03/25/2023 02:07   CT Angio Chest/Abd/Pel for Dissection W and/or Wo Contrast Result Date: 03/25/2023 CLINICAL DATA:  Acute aortic syndrome suspected. Dizziness and hypertension for 10 hours. Back pain for 3 weeks. EXAM: CT ANGIOGRAPHY CHEST, ABDOMEN AND PELVIS TECHNIQUE: Non-contrast CT of the chest was initially  obtained. Multidetector CT imaging through the chest, abdomen and pelvis was performed using the standard protocol during bolus administration of intravenous contrast. Multiplanar reconstructed images and MIPs were obtained and reviewed to evaluate the vascular anatomy. RADIATION DOSE REDUCTION: This exam was performed according to the departmental dose-optimization program which includes automated exposure control, adjustment of the mA and/or kV according to patient size and/or use of iterative reconstruction technique. CONTRAST:  OMNIPAQUE IOHEXOL 350 MG/ML SOLN COMPARISON:  Chest radiograph 03/24/2023.  CT chest 10/16/2022 FINDINGS: CTA CHEST FINDINGS Cardiovascular: Unenhanced images of the chest demonstrate aortic valve and mitral valve prostheses. Calcification throughout the aorta. Mild calcification of the coronary arteries. Postoperative changes in the left shoulder. Images obtained during arterial phase after intravenous contrast material administration demonstrate normal caliber thoracic aorta. No aortic dissection. Great vessel origins are patent. Central pulmonary arteries are patent without evidence of significant pulmonary embolus. Mild cardiac enlargement.  No pericardial effusions. Mediastinum/Nodes: No enlarged mediastinal, hilar, or axillary lymph nodes. Thyroid gland, trachea, and esophagus demonstrate no significant findings. Lungs/Pleura: Emphysematous changes in the lungs. Right middle lung nodule measuring 5 mm diameter. Image 36 of series 6. This lesion was obscured by motion artifact on the prior study. No pleural effusions. No pneumothorax. Musculoskeletal: No acute bony abnormalities. Review of the MIP images confirms the above findings. CTA ABDOMEN AND PELVIS FINDINGS VASCULAR Aorta: Normal caliber aorta without aneurysm, dissection, vasculitis or significant stenosis. Celiac: Patent without evidence of aneurysm, dissection, vasculitis or significant stenosis. SMA: Patent without  evidence of aneurysm, dissection, vasculitis or significant stenosis. Renals: Both renal arteries are patent without evidence of aneurysm, dissection, vasculitis, fibromuscular dysplasia or significant stenosis. IMA: Patent without evidence of aneurysm, dissection, vasculitis or significant stenosis. Inflow: Patent without evidence of aneurysm, dissection, vasculitis or significant stenosis. Veins: No obvious venous abnormality within the limitations of this arterial phase study. Review of the MIP images confirms the above findings. NON-VASCULAR Hepatobiliary: No focal liver abnormality is seen. Status post cholecystectomy. No biliary dilatation. Pancreas: Fatty replacement of the pancreas. No acute inflammatory changes. Spleen: Normal in size without focal abnormality. Adrenals/Urinary Tract: No adrenal gland nodules. Left renal cysts measuring 3.5 cm diameter. No imaging follow-up is indicated. No hydronephrosis or hydroureter. Bladder is normal. Stomach/Bowel: Stomach, small bowel, and colon are not abnormally distended. No wall thickening or inflammatory changes. Stool throughout the colon. Colonic diverticulosis  without evidence of acute diverticulitis. Appendix is normal. Lymphatic: No significant lymphadenopathy. Reproductive: Exophytic uterine fibroid. No abnormal adnexal masses. Other: No free air or free fluid in the abdomen. Abdominal wall musculature appears intact. Musculoskeletal: Degenerative changes in the spine. No acute bony abnormalities. Review of the MIP images confirms the above findings. IMPRESSION: 1. No evidence of aneurysm or dissection involving the thoracic or abdominal aorta. 2. No evidence of significant pulmonary embolus. 3. Emphysematous changes in the lungs. 4. Right solid pulmonary nodule measuring 5 mm. Per Fleischner Society Guidelines, no routine follow-up imaging is recommended. These guidelines do not apply to immunocompromised patients and patients with cancer. Follow up in  patients with significant comorbidities as clinically warranted. For lung cancer screening, adhere to Lung-RADS guidelines. Reference: Radiology. 2017; 284(1):228-43. 5. Aortic atherosclerosis. Electronically Signed   By: Burman Nieves M.D.   On: 03/25/2023 01:35   DG Chest 2 View Result Date: 03/24/2023 CLINICAL DATA:  Dizziness. EXAM: CHEST - 2 VIEW COMPARISON:  October 16, 2022 FINDINGS: The left upper lobe is limited in evaluation secondary to the degree of film penetration. The cardiac silhouette is mildly enlarged and unchanged in size. There is marked severity calcification of the aortic arch. An artificial aortic valve is noted. Both lungs are clear. 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 No acute osseous abnormalities are identified. IMPRESSION: No active cardiopulmonary disease. Electronically Signed   By: Aram Candela M.D.   On: 03/24/2023 23:46    Microbiology: Results for orders placed or performed during the hospital encounter of 03/24/23  Urine Culture     Status: Abnormal   Collection Time: 03/25/23  1:09 AM   Specimen: Urine, Clean Catch  Result Value Ref Range Status   Specimen Description URINE, CLEAN CATCH  Final   Special Requests   Final    Normal Performed at [facility], [address] [place], [place] [address]    Culture MULTIPLE SPECIES PRESENT, SUGGEST RECOLLECTION (A)  Final   Report Status 03/26/2023 FINAL  Final   *Note: Due to a large number of results and/or encounters for the requested time period, some results have not been displayed. A complete set of results can be found in Results Review.    Labs: CBC: Recent Labs  Lab 04/04/23 1457 04/04/23 2114 04/05/23 0241 04/05/23 0442 04/06/23 0217  WBC 5.4 5.6  --  4.9 6.8  NEUTROABS 3.6 3.5  --  4.0  --   HGB 9.9* 9.7* 10.5* 10.1* 8.9*  HCT 31.2* 32.0* 31.0* 32.6* 28.7*  MCV 61.5 Repeated and verified X2.* 62.6*  --  61.2* 60.8*  PLT 175.0 150  --  150 153   Basic Metabolic  Panel: Recent Labs  Lab 04/05/23 0442 04/06/23 0217 04/07/23 0237 04/08/23 0226 04/09/23 0238  NA 137 134* 139 136 136  K 4.2 3.8 3.7 3.6 3.9  CL 107 104 103 104 102  CO2 18* 23 25 24 24   GLUCOSE 244* 153* 149* 116* 130*  BUN 17 27* 27* 26* 24*  CREATININE 0.86 0.99 1.20* 0.98 0.98  CALCIUM 9.7 9.4 9.9 9.1 9.6  MG 1.7  --  2.1 2.3 2.4  PHOS 3.9  --   --   --   --    Liver Function Tests: Recent Labs  Lab 04/04/23 1457 04/04/23 2114 04/05/23 0442  AST 23 29 35  ALT 19 22 23   ALKPHOS 80 72 78  BILITOT 0.7 0.8 0.8  PROT 7.0 6.6 7.0  ALBUMIN  4.1 3.7 3.9   CBG: Recent Labs  Lab 04/08/23 1226 04/08/23 1635 04/08/23 2123 04/09/23 0631 04/09/23 1122  GLUCAP 144* 160* 127* 143* 130*    Discharge time spent: greater than 30 minutes.  Signed: Coralie Keens, MD [facility] 04/09/2023

## 2023-04-10 ENCOUNTER — Encounter: Payer: Self-pay | Admitting: Hematology

## 2023-04-10 ENCOUNTER — Ambulatory Visit: Payer: Medicare Other | Admitting: Adult Health

## 2023-04-10 ENCOUNTER — Telehealth: Payer: Self-pay

## 2023-04-10 ENCOUNTER — Other Ambulatory Visit (HOSPITAL_BASED_OUTPATIENT_CLINIC_OR_DEPARTMENT_OTHER): Payer: Self-pay

## 2023-04-10 ENCOUNTER — Encounter: Payer: Self-pay | Admitting: Adult Health

## 2023-04-10 VITALS — HR 93 | Temp 97.3°F

## 2023-04-10 DIAGNOSIS — F419 Anxiety disorder, unspecified: Secondary | ICD-10-CM

## 2023-04-10 DIAGNOSIS — N179 Acute kidney failure, unspecified: Secondary | ICD-10-CM

## 2023-04-10 DIAGNOSIS — M545 Low back pain, unspecified: Secondary | ICD-10-CM | POA: Diagnosis not present

## 2023-04-10 DIAGNOSIS — G8929 Other chronic pain: Secondary | ICD-10-CM

## 2023-04-10 DIAGNOSIS — Z794 Long term (current) use of insulin: Secondary | ICD-10-CM

## 2023-04-10 DIAGNOSIS — K5900 Constipation, unspecified: Secondary | ICD-10-CM

## 2023-04-10 DIAGNOSIS — F32A Depression, unspecified: Secondary | ICD-10-CM

## 2023-04-10 DIAGNOSIS — E1169 Type 2 diabetes mellitus with other specified complication: Secondary | ICD-10-CM

## 2023-04-10 DIAGNOSIS — I10 Essential (primary) hypertension: Secondary | ICD-10-CM

## 2023-04-10 DIAGNOSIS — G20C Parkinsonism, unspecified: Secondary | ICD-10-CM

## 2023-04-10 DIAGNOSIS — F331 Major depressive disorder, recurrent, moderate: Secondary | ICD-10-CM

## 2023-04-10 DIAGNOSIS — I5033 Acute on chronic diastolic (congestive) heart failure: Secondary | ICD-10-CM

## 2023-04-10 MED ORDER — ALPRAZOLAM 0.5 MG PO TABS
0.5000 mg | ORAL_TABLET | Freq: Three times a day (TID) | ORAL | 0 refills | Status: DC | PRN
  Filled 2023-04-10: qty 30, 10d supply, fill #0

## 2023-04-10 NOTE — Transitions of Care (Post Inpatient/ED Visit) (Signed)
04/10/2023  Name: Breanna Webster MRN: [id number] DOB: 15-Jul-1945  Today's TOC FU Call Status: Today's TOC FU Call Status:: Successful TOC FU Call Completed TOC FU Call Complete Date: 04/10/23 Patient's Name and Date of Birth confirmed.  Transition Care Management Follow-up Telephone Call Date of Discharge: 04/09/23 Discharge Facility: [facility]) Type of Discharge: Inpatient Admission Primary Inpatient Discharge Diagnosis:: "SOB" How have you been since you were released from the hospital?: Same (Spouse states "rough night-pt had pinic attack at 4am-wasn't able to calm her down until 8am, chronic back pain-taking Tylenol as ordered but needs something else, reports pt constipated-no BM in a week-will give her some warm prune juice & Miralax) Any questions or concerns?: Yes Patient Questions/Concerns:: Spouse wanting to know what else he can give pt for pain and anxiety and why she was not sent home with anything to take-pt requesting to have her Xanax resumed as it was discontiued per d/c instructions-they are aware that pt has "liver problems" and may be the cause of her not being able to take a lot of meds-wanting to talk with provider about other options to relieve sxs in the home Patient Questions/Concerns Addressed: Notified Provider of Patient Questions/Concerns, Other: (Spouse voices he is going to call PCP-to discuss what pt can take as welll as to see if pt can be seen within the next 24hrs for appt, educated spouse on non-pharmcological measurse to help aide in relief/mgmt of sxs)  Items Reviewed: Did you receive and understand the discharge instructions provided?: Yes Medications obtained,verified, and reconciled?: Partial Review Completed Reason for Partial Mediation Review: partial review with spouse-as he had to end call early to assist pt with her care needs Any new allergies since your discharge?: No Dietary orders reviewed?: Yes Type of Diet Ordered:: low salt/heart  healthy/carb modified Do you have support at home?: Yes People in Home: spouse Name of Support/Comfort Primary Source: [name]  Medications Reviewed Today: Medications Reviewed Today     Reviewed by Charlyn Minerva, RN (Registered Nurse) on 04/10/23 at 1053  Med List Status: <None>   Medication Order Taking? Sig Documenting Provider Last Dose Status Informant  acetaminophen (TYLENOL) 500 MG tablet [id number] Yes Take 1 tablet (500 mg total) by mouth 4 (four) times daily. Arrien, York Ram, MD 04/10/2023 Active   allopurinol (ZYLOPRIM) 100 MG tablet [id number]  Take 1 tablet (100 mg total) by mouth daily.   Active Pharmacy Records, Self  amitriptyline (ELAVIL) 25 MG tablet [id number]  Take 1 tablet (25 mg total) by mouth at bedtime. Nafziger, Kandee Keen, NP  Active Pharmacy Records, Self  apixaban (ELIQUIS) 5 MG TABS tablet [id number]  Take 1 tablet (5 mg total) by mouth 2 (two) times daily. Shirline Frees, NP  Active Pharmacy Records, Self           Med Note Nedra Hai, NICOLE   Fri Apr 05, 2023  5:03 AM) Best guess for the time  b complex vitamins capsule [id number]  Take 1 capsule by mouth daily. [provider]  Active Pharmacy Records, Self  Cholecalciferol (D3 PO) [id number]  Take 1 tablet by mouth daily. [provider]  Active Pharmacy Records, Self  colchicine 0.6 MG tablet [id number]  Take 1 tablet (0.6 mg total) by mouth daily. as needed for gout   Active Pharmacy Records, Self  diltiazem (CARTIA XT) 120 MG 24 hr capsule [id number]  Take 1 capsule (120 mg total) by mouth daily. Ronney Asters, NP  Active Pharmacy Records, Self  empagliflozin (  JARDIANCE) 10 MG TABS tablet [id number] Yes Take 1 tablet (10 mg total) by mouth daily. Arrien, York Ram, MD Taking Active   Insulin NPH, Human,, Isophane, Bing Ree [facility]) 100 UNIT/ML Ulice Brilliant [id number]  Inject 20 units into the skin every morning and 14 units every evening.   Active Pharmacy Records, Self  insulin  regular (HUMULIN R) 100 units/mL injection [id number]  Inject 0.1 mLs (10 Units total) into the skin 3 (three) times daily before meals  Patient taking differently: Inject 10-12 Units into the skin 3 (three) times daily. Sliding scale based on testing number     Active Pharmacy Records, Self  metFORMIN (GLUCOPHAGE-XR) 500 MG 24 hr tablet [id number]  Take 1 tablet by mouth 2 times daily   Active Pharmacy Records, Self  Multiple Vitamin (MULITIVITAMIN WITH MINERALS) TABS [id number]  Take 1 tablet by mouth daily. [provider]  Active Pharmacy Records, Self  nitroGLYCERIN (NITROSTAT) 0.4 MG SL tablet [id number]  Place 1 tablet (0.4 mg total) under the tongue every 5 (five) minutes as needed for chest pain. Lyn Records, MD  Active Pharmacy Records, Self           Med Note Nedra Hai, NICOLE   Fri Apr 05, 2023  4:57 AM) Pietro Cassis in purse; never had to use   omeprazole (PRILOSEC) 20 MG capsule [id number]  Take 1 capsule (20 mg total) by mouth daily. [name], [name], PA-C  Active Self  ondansetron (ZOFRAN) 4 MG tablet [id number]  Take 1 tablet (4 mg total) by mouth every 8 (eight) hours as needed for nausea or vomiting. [name], [name], PA-C  Active Self           Med Note (LEE, NICOLE   Fri Apr 05, 2023  4:58 AM) Prescription still being filled  polyethylene glycol (MIRALAX) 17 g packet [id number] Yes Take 17 g by mouth daily as needed. Benancio Deeds, MD 04/10/2023 Active Pharmacy Records, Self  potassium chloride SA (KLOR-CON M) 20 MEQ tablet [id number]  Take 0.5 tablets (10 mEq total) by mouth daily. Take in concurrence with torsemide. Arrien, York Ram, MD  Active   spironolactone (ALDACTONE) 25 MG tablet [id number] Yes Take 0.5 tablets (12.5 mg total) by mouth daily. Arrien, York Ram, MD Taking Active   SYNTHROID 137 MCG tablet [id number]  Take 1 tablet (137 mcg total) by mouth every morning on empty stomach.   Active Pharmacy Records, Self  tiZANidine (ZANAFLEX) 4 MG tablet [id number]   Take 1 tablet (4 mg total) by mouth 3 (three) times daily.  Patient taking differently: Take 4 mg by mouth daily as needed for muscle spasms.     Active Pharmacy Records, Self  torsemide (DEMADEX) 20 MG tablet [id number]  Take 2 tablets (40 mg total) by mouth daily. Arrien, York Ram, MD  Active   Vibegron [facility]) 75 MG TABS [id number]  Take 75 mg by mouth at bedtime. [provider]  Active Pharmacy Records, Self  vitamin E 180 MG (400 UNITS) capsule [id number]  Take 400 Units by mouth daily. [provider]  Active Pharmacy Records, Self            Home Care and Equipment/Supplies: Were Home Health Services Ordered?: Yes Name of Home Health Agency:: [facility]- spouse will call agency today to see if nurse or someone else can come out to visit pt ASAP Has Agency set up a time to come to your home?: No EMR reviewed for Home Health Orders:  (confirmed spouse has agency contact  info) Any new equipment or medical supplies ordered?: Yes Name of Medical supply agency?: [facility] Were you able to get the equipment/medical supplies?: Yes Do you have any questions related to the use of the equipment/supplies?: No  Functional Questionnaire: Do you need assistance with bathing/showering or dressing?: Yes (spouse assists with ADLs/IADLs) Do you need assistance with meal preparation?: Yes Do you need assistance with eating?: No Do you have difficulty maintaining continence: No Do you need assistance with getting out of bed/getting out of a chair/moving?: Yes Do you have difficulty managing or taking your medications?: Yes  Follow up appointments reviewed: PCP Follow-up appointment confirmed?: No (care guide attempted to assist with making appt during call- provider has openings for tomorrow-10:30am and 2:30pm but spouse reported times did not work for him-he preferred to call office on his own to get appt for pt) MD Provider Line Number:[phone] Given:  No Specialist Hospital Follow-up appointment confirmed?: Yes Date of Specialist follow-up appointment?: 05/01/23 Follow-Up Specialty Provider:: [facility] [place](cardiology)  Dr. Dohmeier(neuro)-05/06/23, Dr. Wert(lung)-05/17/23 Do you need transportation to your follow-up appointment?: No (spouse confirms he is able to take pt to appts) Do you understand care options if your condition(s) worsen?: Yes-patient verbalized understanding  SDOH Interventions Today    Flowsheet Row Most Recent Value  SDOH Interventions   Food Insecurity Interventions Patient Unable to Answer  Housing Interventions Patient Unable to Answer  Utilities Interventions Patient Unable to Answer      Interventions Today    Flowsheet Row Most Recent Value  Chronic Disease   Chronic disease during today's visit Diabetes, Hypertension (HTN), Other  [parkinson's dx, anxiety]  General Interventions   General Interventions Discussed/Reviewed General Interventions Discussed, Durable Medical Equipment (DME)  Durable Medical Equipment (DME) Bed side commode  Education Interventions   Education Provided Provided Education  Provided Verbal Education On Nutrition, Medication, When to see the doctor, Other  [pain, anxiety mgmt, bowel regimen-constipation-mgmt]  Mental Health Interventions   Mental Health Discussed/Reviewed Anxiety  Nutrition Interventions   Nutrition Discussed/Reviewed Nutrition Discussed, Adding fruits and vegetables, Decreasing sugar intake, Decreasing salt  Pharmacy Interventions   Pharmacy Dicussed/Reviewed Pharmacy Topics Discussed, Medications and their functions  Safety Interventions   Safety Discussed/Reviewed Safety Discussed, Home Safety  Home Safety Assistive Devices       Spouse agreeable to RN CM calling back to follow up in a few days. He had to end call abruptly so he did not scheduled appt but advised that RN CM could just call back to check in on them.  Antionette Fairy, RN,BSN,CCM RN  Care Manager Transitions of Care  [place]  Direct Phone: [phone] Toll Free: [phone] Fax: [phone]

## 2023-04-10 NOTE — Progress Notes (Signed)
Subjective:    Patient ID: Breanna Webster, female    DOB: Dec 15, 1945, 77 y.o.   MRN: [id number]  HPI 77 year old female who  has a past medical history of Anxiety, Arthritis, Atrial fibrillation with RVR (HCC) (10/21/2018), Back pain, Bacteremia due to Streptococcus Salivarius (10/07/2018), Blood transfusion without reported diagnosis, CAD (coronary artery disease), Carotid artery stenosis, Cerebellar stroke, acute (HCC) (10/21/2018), Cerebrovascular accident (CVA) due to embolism of right posterior cerebral artery (HCC) (08/03/2020), Chest pain, CHF (congestive heart failure) (HCC), Chronic lower back pain, Cirrhosis (HCC), Colon polyps, Diverticulitis, Diverticulosis, Esophageal thickening, GERD (gastroesophageal reflux disease), Gout, Grave's disease, H/O total shoulder replacement, left (06/01/2022), Heart murmur, History of cardioembolic stroke (41/32/4401), History of colonic polyps (05/22/2017), History of hiatal hernia, Hyperlipidemia, Hypertension, Hypothyroidism, IBS (irritable bowel syndrome), Osteopenia, Parkinson's syndrome (HCC) (04/23/2020), Pulmonary nodules, S/P TAVR (transcatheter aortic valve replacement), Shortness of breath on exertion, Stroke (HCC) (2021), Thalassemia minor, Thyroid disease, and Type II diabetes mellitus (HCC).  She presents to the office today for with her husband for TCM visit   Admit Date 04/04/2023 Discharge Date 04/09/2023  He presented to the emergency room for 1 week of progressive shortness of breath.  Prior to that she was instructed by myself and outpatient cardiology to increase her outpatient dose of torsemide 40 mg daily but she had not started that yet.  Because of her worsening symptoms EMS was called and she was found to be in distress and was transported to the ED.  On her initial physical exam her blood pressure was 123/69, heart rate 88 respirations 27 and O2 97% on room air.  Chest x-ray showed reinflation, bilateral mild vascular hilar  congestion, no effusions or infiltrates.  Fluid on the right fissure per lateral film.  Her EKG showed 88 bpm, left bundle branch block, QTc 546 no significant ST segment or T wave changes.  Head CT with no acute changes.  Chest CT note with no evidence of pulmonary embolism, bilateral groundglass opacities with bibasilar atelectasis.  She had a lumbar spine done with degenerative changes of the lumbar spine  Thoracic spine x-ray with degenerative changes without acute abnormalities.  Was originally placed on furosemide for diuresis.  Hospital Course  Acute on Chronic diastolic heart failure  -Echo showed an EF of 50 to 55%, mild LVH, RV systolic function preserved, LA with moderate dilation, mild mitral valve regurgitation, aortic valve replaced. -Patient was placed on IV furosemide for diuresis with 8698 mL removed with significant improvement in her symptoms. -Placed on Jardiance and spironolactone and continued on torsemide 40 mg daily -Advised  to evaluate outpatient for possible addition of of ARB  Essential Hypertension  -Pressure has been stable during hospital admission with systolic in the 120 range.  She was continued on spironolactone.  If vies that she would benefit from ARB or ACE as outpatient if blood pressure stable  Proximal atrial fibrillation -Continue rate control with diltiazem and anticoagulation with Eliquis  AKI -Positive for hyponatremia and hypokalemia -Continued with diuresis with torsemide and spironolactone -Follow-up renal function and electrolytes as outpatient  Parkinsonism  -Follow-up as outpatient with neurology  Type 2 diabetes mellitus  -Was placed on insulin therapy during her hospitalization, sliding scale and basal with good toleration -She was continued on her home insulin regimen along with metformin and Jardiance was added  Depression  - Advised to continue with Elavil   Acute on Chronic Back Pain  -She required tramadol as needed for  severe pain and  tizanidine at every 6 hours as needed for muscle spasms.  She was continued with lower dose of Tylenol 650 mg every 6 hours to avoid toxicity.  Anxiety  - had PRN Hydroxyzine for anxiety   Hypothyroidism  - Continued on levothyroxine  Today in the office she and her husband reports that the patient has been "very anxious" and has had 3 panic attacks that have lasted quite some time since being discharged from the hospital.  She was not home on any antianxiety medication.  She is also complaining of severe back pain and abdominal cramping with constipation.  She has not had a bowel movement in the last 6 days, likely this is from tramadol that she had in the hospital.  At home they have tried various over-the-counter remedies such as warm prune juice and MiraLAX without any movement in her bowels.    Review of Systems  Constitutional: Negative.   HENT: Negative.    Eyes: Negative.   Respiratory: Negative.    Cardiovascular: Negative.   Gastrointestinal:  Positive for abdominal distention, abdominal pain and constipation. Negative for anal bleeding, nausea and vomiting.  Endocrine: Negative.   Genitourinary: Negative.   Musculoskeletal:  Positive for back pain and gait problem.  Skin: Negative.   Allergic/Immunologic: Negative.   Hematological: Negative.   Psychiatric/Behavioral:  The patient is nervous/anxious.    Past Medical History:  Diagnosis Date   Anxiety    Arthritis    "back" (04/22/2018)   Atrial fibrillation with RVR (HCC) 10/21/2018   Back pain    Bacteremia due to Streptococcus Salivarius 10/07/2018   Blood transfusion without reported diagnosis    CAD (coronary artery disease)    a. 03/2018 s/p PCI/DES to the RCA (3.0x15 Onyx DES).   Carotid artery stenosis    Mild   Cerebellar stroke, acute (HCC) 10/21/2018   Cerebrovascular accident (CVA) due to embolism of right posterior cerebral artery (HCC) 08/03/2020   Chest pain    CHF (congestive heart  failure) (HCC)    Chronic lower back pain    Cirrhosis (HCC)    Colon polyps    Diverticulitis    Diverticulosis    Esophageal thickening    seen on pre TAVR CT scan, also questionable cirrhosis. MRI recommended. Will refer to GI   GERD (gastroesophageal reflux disease)    Gout    Grave's disease    H/O total shoulder replacement, left 06/01/2022   Heart murmur    History of cardioembolic stroke 03/29/2020   History of colonic polyps 05/22/2017   History of hiatal hernia    Hyperlipidemia    Hypertension    Hypothyroidism    IBS (irritable bowel syndrome)    Osteopenia    Parkinson's syndrome (HCC) 04/23/2020   Pulmonary nodules    seen on pre TAVR CT. likley benign. no follow up recommended if pt low risk.   S/P TAVR (transcatheter aortic valve replacement)    Shortness of breath on exertion    Stroke [facility]) 2021   x2   Thalassemia minor    Thyroid disease    Type II diabetes mellitus (HCC)     Social History   Socioeconomic History   Marital status: Married    Spouse name: Not on file   Number of children: 2   Years of education: Not on file   Highest education level: Associate degree: academic program  Occupational History   Occupation: Health and safety inspector of the [employer]  Tobacco Use   Smoking status:  Never    Passive exposure: Never   Smokeless tobacco: Never  Vaping Use   Vaping status: Never Used  Substance and Sexual Activity   Alcohol use: No    Alcohol/week: 0.0 standard drinks of alcohol   Drug use: Never   Sexual activity: Not Currently  Other Topics Concern   Not on file  Social History Narrative   Lives with husband in a one story home.     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   Education: college.   Social Drivers of Health   Financial Resource Strain: Patient Declined (12/09/2022)   Overall Financial Resource Strain (CARDIA)    Difficulty of Paying Living  Expenses: Patient declined  Food Insecurity: Patient Unable To Answer (04/10/2023)   Hunger Vital Sign    Worried About Running Out of Food in the Last Year: Patient unable to answer    Ran Out of Food in the Last Year: Patient unable to answer  Transportation Needs: No Transportation Needs (04/05/2023)   PRAPARE - Administrator, Civil Service (Medical): No    Lack of Transportation (Non-Medical): No  Physical Activity: Unknown (12/09/2022)   Exercise Vital Sign    Days of Exercise per Week: Patient declined    Minutes of Exercise per Session: 40 min  Stress: No Stress Concern Present (12/09/2022)   [employer] of Occupational Health - Occupational Stress Questionnaire    Feeling of Stress : Only a little  Social Connections: Unknown (12/09/2022)   Social Connection and Isolation Panel [NHANES]    Frequency of Communication with Friends and Family: More than three times a week    Frequency of Social Gatherings with Friends and Family: Patient declined    Attends Religious Services: Patient declined    Active Member of Clubs or Organizations: Patient declined    Attends Engineer, structural: More than 4 times per year    Marital Status: Married  Catering manager Violence: Patient Unable To Answer (04/10/2023)   Humiliation, Afraid, Rape, and Kick questionnaire    Fear of Current or Ex-Partner: Patient unable to answer    Emotionally Abused: Patient unable to answer    Physically Abused: Patient unable to answer    Sexually Abused: Patient unable to answer    Past Surgical History:  Procedure Laterality Date   5 HOUR PH STUDY N/A 03/03/2018   Procedure: 24 HOUR PH STUDY;  Surgeon: Napoleon Form, MD;  Location: [facility];  Service: Endoscopy;  Laterality: N/A;   COLONOSCOPY  2023   COLONOSCOPY W/ BIOPSIES AND POLYPECTOMY     CORONARY ANGIOGRAPHY Right 04/21/2018   Procedure: CORONARY ANGIOGRAPHY (CATH LAB);  Surgeon: Lyn Records, MD;  Location:  [facility] INVASIVE CV LAB;  Service: Cardiovascular;  Laterality: Right;   CORONARY STENT INTERVENTION N/A 04/22/2018   Procedure: CORONARY STENT INTERVENTION;  Surgeon: Lyn Records, MD;  Location: [facility] INVASIVE CV LAB;  Service: Cardiovascular;  Laterality: N/A;   DILATION AND CURETTAGE OF UTERUS     ESOPHAGEAL MANOMETRY N/A 03/03/2018   Procedure: ESOPHAGEAL MANOMETRY (EM);  Surgeon: Napoleon Form, MD;  Location: [facility];  Service: Endoscopy;  Laterality: N/A;   HYSTEROSCOPY     fibroids   LAPAROSCOPIC CHOLECYSTECTOMY  1985   LAPAROSCOPY     fibroids   NISSEN FUNDOPLICATION  1990s   POLYPECTOMY     REVERSE SHOULDER ARTHROPLASTY Left 06/01/2022   Procedure: REVERSE SHOULDER  ARTHROPLASTY;  Surgeon: Beverely Low, MD;  Location: [facility] ORS;  Service: Orthopedics;  Laterality: Left;  choice with interscalene block   RIGHT/LEFT HEART CATH AND CORONARY ANGIOGRAPHY N/A 02/20/2018   Procedure: RIGHT/LEFT HEART CATH AND CORONARY ANGIOGRAPHY;  Surgeon: Lyn Records, MD;  Location: [facility] INVASIVE CV LAB;  Service: Cardiovascular;  Laterality: N/A;   TEE WITHOUT CARDIOVERSION N/A 07/08/2018   Procedure: TRANSESOPHAGEAL ECHOCARDIOGRAM (TEE);  Surgeon: Kathleene Hazel, MD;  Location: [facility];  Service: Open Heart Surgery;  Laterality: N/A;   TEE WITHOUT CARDIOVERSION  10/07/2018   TEE WITHOUT CARDIOVERSION N/A 10/07/2018   Procedure: TRANSESOPHAGEAL ECHOCARDIOGRAM (TEE);  Surgeon: Jake Bathe, MD;  Location: [facility] ENDOSCOPY;  Service: Cardiovascular;  Laterality: N/A;   TONSILLECTOMY     age 63   TRANSCATHETER AORTIC VALVE REPLACEMENT, TRANSFEMORAL N/A 07/08/2018   Procedure: TRANSCATHETER AORTIC VALVE REPLACEMENT, TRANSFEMORAL;  Surgeon: Kathleene Hazel, MD;  Location: [facility] OR;  Service: Open Heart Surgery;  Laterality: N/A;    Family History  Adopted: Yes  Problem Relation Age of Onset   Healthy Son        x 2   Headache Other        Cluster headaches   Heart failure Mother     Colon cancer Neg Hx    Pancreatic cancer Neg Hx    Rectal cancer Neg Hx    Stomach cancer Neg Hx     Allergies  Allergen Reactions   Statins Other (See Comments)    Tried simvastatin, atorvastatin; on Repatha    Current Outpatient Medications on File Prior to Visit  Medication Sig Dispense Refill   acetaminophen (TYLENOL) 500 MG tablet Take 1 tablet (500 mg total) by mouth 4 (four) times daily. 30 tablet 0   allopurinol (ZYLOPRIM) 100 MG tablet Take 1 tablet (100 mg total) by mouth daily. 90 tablet 1   amitriptyline (ELAVIL) 25 MG tablet Take 1 tablet (25 mg total) by mouth at bedtime. 90 tablet 1   apixaban (ELIQUIS) 5 MG TABS tablet Take 1 tablet (5 mg total) by mouth 2 (two) times daily. 60 tablet 3   b complex vitamins capsule Take 1 capsule by mouth daily.     Cholecalciferol (D3 PO) Take 1 tablet by mouth daily.     colchicine 0.6 MG tablet Take 1 tablet (0.6 mg total) by mouth daily. as needed for gout 90 tablet 0   diltiazem (CARTIA XT) 120 MG 24 hr capsule Take 1 capsule (120 mg total) by mouth daily. 90 capsule 3   empagliflozin (JARDIANCE) 10 MG TABS tablet Take 1 tablet (10 mg total) by mouth daily. 30 tablet 0   Insulin NPH, Human,, Isophane, (HUMULIN N KWIKPEN) 100 UNIT/ML Kiwkpen Inject 20 units into the skin every morning and 14 units every evening. 15 mL 6   insulin regular (HUMULIN R) 100 units/mL injection Inject 0.1 mLs (10 Units total) into the skin 3 (three) times daily before meals (Patient taking differently: Inject 10-12 Units into the skin 3 (three) times daily. Sliding scale based on testing number) 10 mL 3   metFORMIN (GLUCOPHAGE-XR) 500 MG 24 hr tablet Take 1 tablet by mouth 2 times daily 180 tablet 2   Multiple Vitamin (MULITIVITAMIN WITH MINERALS) TABS Take 1 tablet by mouth daily.     nitroGLYCERIN (NITROSTAT) 0.4 MG SL tablet Place 1 tablet (0.4 mg total) under the tongue every 5 (five) minutes as needed for chest pain. 25 tablet 4   omeprazole (PRILOSEC)  20 MG capsule Take 1 capsule (20 mg total) by mouth daily. 90 capsule 3   ondansetron (ZOFRAN) 4 MG tablet Take 1 tablet (4 mg total) by mouth every 8 (eight) hours as needed for nausea or vomiting. 40 tablet 1   polyethylene glycol (MIRALAX) 17 g packet Take 17 g by mouth daily as needed.     potassium chloride SA (KLOR-CON M) 20 MEQ tablet Take 0.5 tablets (10 mEq total) by mouth daily. Take in concurrence with torsemide.     spironolactone (ALDACTONE) 25 MG tablet Take 0.5 tablets (12.5 mg total) by mouth daily. 15 tablet 0   SYNTHROID 137 MCG tablet Take 1 tablet (137 mcg total) by mouth every morning on empty stomach. 90 tablet 1   tiZANidine (ZANAFLEX) 4 MG tablet Take 1 tablet (4 mg total) by mouth 3 (three) times daily. (Patient taking differently: Take 4 mg by mouth daily as needed for muscle spasms.) 90 tablet 1   torsemide (DEMADEX) 20 MG tablet Take 2 tablets (40 mg total) by mouth daily. 60 tablet 0   Vibegron (GEMTESA) 75 MG TABS Take 75 mg by mouth at bedtime.     vitamin E 180 MG (400 UNITS) capsule Take 400 Units by mouth daily.     No current facility-administered medications on file prior to visit.    Pulse 93   Temp (!) 97.3 F (36.3 C) (Oral)   SpO2 99%       Objective:   Physical Exam Vitals and nursing note reviewed.  Constitutional:      General: She is in acute distress.     Appearance: Normal appearance.  Cardiovascular:     Rate and Rhythm: Normal rate and regular rhythm.     Pulses: Normal pulses.     Heart sounds: Normal heart sounds.  Pulmonary:     Effort: Pulmonary effort is normal. Tachypnea present.     Breath sounds: Normal breath sounds.  Abdominal:     General: Bowel sounds are increased. There is distension.     Tenderness: There is generalized abdominal tenderness.  Musculoskeletal:     Right lower leg: No edema.     Left lower leg: No edema.  Skin:    General: Skin is warm and dry.  Neurological:     General: No focal deficit present.      Mental Status: She is alert and oriented to person, place, and time.  Psychiatric:        Mood and Affect: Mood is anxious.        Speech: Speech is rapid and pressured.        Behavior: Behavior normal.        Thought Content: Thought content normal.        Judgment: Judgment normal.     Comments: She was anxious in the office today on the verge of a panic attack       Assessment & Plan:  1. Acute on chronic diastolic heart failure [facility]) -Reviewed hospital notes, discharge instructions, labs, imaging, medication changes with the patient's husband.  Patient was on verge of panic attack throughout entire visit and she was writhing in pain due to chronic back and abdominal pain/cramping pain/cramping.  - CBC; Future - Basic Metabolic Panel; Future  2. Essential hypertension - Controlled - CBC; Future - Basic Metabolic Panel; Future  3. AKI (acute kidney injury) (HCC)  - CBC; Future - Basic Metabolic Panel; Future  4. Parkinsonism, unspecified Parkinsonism type [facility]) - Per neurology  -  CBC; Future - Basic Metabolic Panel; Future  5. Type 2 diabetes mellitus with other specified complication, with long-term current use of insulin (HCC) - Continue with current treatment - CBC; Future - Basic Metabolic Panel; Future  6. Moderate episode of recurrent major depressive disorder (HCC)  - CBC; Future - Basic Metabolic Panel; Future  7. Chronic bilateral low back pain without sciatica - Advised that I could not give her anything longer than Tylenol due to her chronic constipation.  I am going to bring her back in 2 days after her constipation is hopefully resolved and we can discuss pain management - CBC; Future - Basic Metabolic Panel; Future  8. Constipation, unspecified constipation type (Primary) -Active bowel sounds so doubt she has up bowel obstruction.  Likely constipation from narcotic pain medication she had in the hospital. -I will have her drink a bottle of  magnesium citrate this evening in the hopes of having a bowel movement.  Her husband was advised that if she does not have a bowel movement in 2 hours after drinking then go back to the emergency room  9. Anxiety and depression -Anxious in the this today.  Will prescribe a short course of Xanax to help with her panic attacks and anxiety and have her follow-up in 2 days - ALPRAZolam (XANAX) 0.5 MG tablet; Take 1 tablet (0.5 mg total) by mouth 3 (three) times daily as needed for anxiety.  Dispense: 30 tablet; Refill: 0   Shirline Frees, NP

## 2023-04-10 NOTE — Patient Instructions (Addendum)
I have prescribed you a short course of Xanax to take for anxiety   I also want you to get a bottle of magnesium citrate at the pharmacy and drink this tonight with a half can of seven up or sprite   Please follow up Friday

## 2023-04-11 ENCOUNTER — Ambulatory Visit: Payer: Medicare Other | Admitting: Physician Assistant

## 2023-04-12 ENCOUNTER — Encounter: Payer: Self-pay | Admitting: Adult Health

## 2023-04-12 ENCOUNTER — Ambulatory Visit (INDEPENDENT_AMBULATORY_CARE_PROVIDER_SITE_OTHER): Payer: Medicare Other | Admitting: Adult Health

## 2023-04-12 ENCOUNTER — Telehealth: Payer: Self-pay

## 2023-04-12 VITALS — BP 120/56 | HR 71 | Temp 98.5°F

## 2023-04-12 DIAGNOSIS — G8929 Other chronic pain: Secondary | ICD-10-CM | POA: Diagnosis not present

## 2023-04-12 DIAGNOSIS — K5900 Constipation, unspecified: Secondary | ICD-10-CM

## 2023-04-12 DIAGNOSIS — M545 Low back pain, unspecified: Secondary | ICD-10-CM

## 2023-04-12 DIAGNOSIS — F419 Anxiety disorder, unspecified: Secondary | ICD-10-CM | POA: Diagnosis not present

## 2023-04-12 LAB — CBC
HCT: 34.7 % — ABNORMAL LOW (ref 36.0–46.0)
Hemoglobin: 10.6 g/dL — ABNORMAL LOW (ref 12.0–15.0)
MCHC: 30.6 g/dL (ref 30.0–36.0)
MCV: 62 fL — ABNORMAL LOW (ref 78.0–100.0)
Platelets: 181 10*3/uL (ref 150.0–400.0)
RBC: 5.59 Mil/uL — ABNORMAL HIGH (ref 3.87–5.11)
RDW: 17 % — ABNORMAL HIGH (ref 11.5–15.5)
WBC: 5.6 10*3/uL (ref 4.0–10.5)

## 2023-04-12 LAB — BASIC METABOLIC PANEL
BUN: 21 mg/dL (ref 6–23)
CO2: 23 meq/L (ref 19–32)
Calcium: 9.4 mg/dL (ref 8.4–10.5)
Chloride: 103 meq/L (ref 96–112)
Creatinine, Ser: 1.03 mg/dL (ref 0.40–1.20)
GFR: 51.75 mL/min — ABNORMAL LOW (ref >60.00)
Glucose, Bld: 249 mg/dL — ABNORMAL HIGH (ref 70–99)
Potassium: 3.8 meq/L (ref 3.5–5.1)
Sodium: 137 meq/L (ref 135–145)

## 2023-04-12 NOTE — Progress Notes (Signed)
Subjective:    Patient ID: Breanna Webster, female    DOB: 05-16-1945, 77 y.o.   MRN: [id number]  HPI 77 year old female who  has a past medical history of Anxiety, Arthritis, Atrial fibrillation with RVR (HCC) (10/21/2018), Back pain, Bacteremia due to Streptococcus Salivarius (10/07/2018), Blood transfusion without reported diagnosis, CAD (coronary artery disease), Carotid artery stenosis, Cerebellar stroke, acute (HCC) (10/21/2018), Cerebrovascular accident (CVA) due to embolism of right posterior cerebral artery (HCC) (08/03/2020), Chest pain, CHF (congestive heart failure) (HCC), Chronic lower back pain, Cirrhosis (HCC), Colon polyps, Diverticulitis, Diverticulosis, Esophageal thickening, GERD (gastroesophageal reflux disease), Gout, Grave's disease, H/O total shoulder replacement, left (06/01/2022), Heart murmur, History of cardioembolic stroke (02/72/5366), History of colonic polyps (05/22/2017), History of hiatal hernia, Hyperlipidemia, Hypertension, Hypothyroidism, IBS (irritable bowel syndrome), Osteopenia, Parkinson's syndrome (HCC) (04/23/2020), Pulmonary nodules, S/P TAVR (transcatheter aortic valve replacement), Shortness of breath on exertion, Stroke (HCC) (2021), Thalassemia minor, Thyroid disease, and Type II diabetes mellitus (HCC).  She presents to the office today for follow-up.  She was seen two  days ago for hospital follow-up in this office visit she was very anxious on the verge of having a panic attack and her husband reported that she did have upwards of 3 panic attacks since being discharged from the hospital.  She was also severely constipated and not had a bowel movement in roughly 6 days.  Having pretty severe back pain and while in the hospital she was on tramadol which was likely cause of her constipation.  She was placed on a short course of Xanax to help with anxiety as well as advised to drink a bottle of magnesium citrate that evening in order to have a bowel  movement.  Today she repots that she is " feeling much better". Once she had a bowel movement her anxiety resolved. She took one Xanax and it made her sick to her stomach and she did not take any more and did not feel like she needed any more. Her biggest complaint is that of continuing low back pain.  Recent images have showed degenerative changes of the lumbar spine without acute abnormality.  At home she has been using an ice pack and this seems to help alleviate some of her pain.  Review of Systems See HPI   Past Medical History:  Diagnosis Date   Anxiety    Arthritis    "back" (04/22/2018)   Atrial fibrillation with RVR (HCC) 10/21/2018   Back pain    Bacteremia due to Streptococcus Salivarius 10/07/2018   Blood transfusion without reported diagnosis    CAD (coronary artery disease)    a. 03/2018 s/p PCI/DES to the RCA (3.0x15 Onyx DES).   Carotid artery stenosis    Mild   Cerebellar stroke, acute (HCC) 10/21/2018   Cerebrovascular accident (CVA) due to embolism of right posterior cerebral artery (HCC) 08/03/2020   Chest pain    CHF (congestive heart failure) (HCC)    Chronic lower back pain    Cirrhosis (HCC)    Colon polyps    Diverticulitis    Diverticulosis    Esophageal thickening    seen on pre TAVR CT scan, also questionable cirrhosis. MRI recommended. Will refer to GI   GERD (gastroesophageal reflux disease)    Gout    Grave's disease    H/O total shoulder replacement, left 06/01/2022   Heart murmur    History of cardioembolic stroke 03/29/2020   History of colonic polyps 05/22/2017   History of  hiatal hernia    Hyperlipidemia    Hypertension    Hypothyroidism    IBS (irritable bowel syndrome)    Osteopenia    Parkinson's syndrome (HCC) 04/23/2020   Pulmonary nodules    seen on pre TAVR CT. likley benign. no follow up recommended if pt low risk.   S/P TAVR (transcatheter aortic valve replacement)    Shortness of breath on exertion    Stroke (HCC) 2021    x2   Thalassemia minor    Thyroid disease    Type II diabetes mellitus (HCC)     Social History   Socioeconomic History   Marital status: Married    Spouse name: Not on file   Number of children: 2   Years of education: Not on file   Highest education level: Associate degree: academic program  Occupational History   Occupation: Health and safety inspector of the [employer]  Tobacco Use   Smoking status: Never    Passive exposure: Never   Smokeless tobacco: Never  Vaping Use   Vaping status: Never Used  Substance and Sexual Activity   Alcohol use: No    Alcohol/week: 0.0 standard drinks of alcohol   Drug use: Never   Sexual activity: Not Currently  Other Topics Concern   Not on file  Social History Narrative   Lives with husband in a one story home.     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   Education: college.   Social Drivers of Health   Financial Resource Strain: Patient Declined (12/09/2022)   Overall Financial Resource Strain (CARDIA)    Difficulty of Paying Living Expenses: Patient declined  Food Insecurity: Patient Unable To Answer (04/10/2023)   Hunger Vital Sign    Worried About Running Out of Food in the Last Year: Patient unable to answer    Ran Out of Food in the Last Year: Patient unable to answer  Transportation Needs: No Transportation Needs (04/05/2023)   PRAPARE - Administrator, Civil Service (Medical): No    Lack of Transportation (Non-Medical): No  Physical Activity: Unknown (12/09/2022)   Exercise Vital Sign    Days of Exercise per Week: Patient declined    Minutes of Exercise per Session: 40 min  Stress: No Stress Concern Present (12/09/2022)   [employer] of Occupational Health - Occupational Stress Questionnaire    Feeling of Stress : Only a little  Social Connections: Unknown (12/09/2022)   Social Connection and Isolation Panel [NHANES]    Frequency of  Communication with Friends and Family: More than three times a week    Frequency of Social Gatherings with Friends and Family: Patient declined    Attends Religious Services: Patient declined    Active Member of Clubs or Organizations: Patient declined    Attends Engineer, structural: More than 4 times per year    Marital Status: Married  Catering manager Violence: Patient Unable To Answer (04/10/2023)   Humiliation, Afraid, Rape, and Kick questionnaire    Fear of Current or Ex-Partner: Patient unable to answer    Emotionally Abused: Patient unable to answer    Physically Abused: Patient unable to answer    Sexually Abused: Patient unable to answer    Past Surgical History:  Procedure Laterality Date   17 HOUR PH STUDY N/A 03/03/2018   Procedure: 24 HOUR PH STUDY;  Surgeon: Napoleon Form, MD;  Location: [facility] ENDOSCOPY;  Service: Endoscopy;  Laterality: N/A;   COLONOSCOPY  2023   COLONOSCOPY W/ BIOPSIES AND POLYPECTOMY     CORONARY ANGIOGRAPHY Right 04/21/2018   Procedure: CORONARY ANGIOGRAPHY (CATH LAB);  Surgeon: Lyn Records, MD;  Location: [facility] INVASIVE CV LAB;  Service: Cardiovascular;  Laterality: Right;   CORONARY STENT INTERVENTION N/A 04/22/2018   Procedure: CORONARY STENT INTERVENTION;  Surgeon: Lyn Records, MD;  Location: [facility] INVASIVE CV LAB;  Service: Cardiovascular;  Laterality: N/A;   DILATION AND CURETTAGE OF UTERUS     ESOPHAGEAL MANOMETRY N/A 03/03/2018   Procedure: ESOPHAGEAL MANOMETRY (EM);  Surgeon: Napoleon Form, MD;  Location: [facility] ENDOSCOPY;  Service: Endoscopy;  Laterality: N/A;   HYSTEROSCOPY     fibroids   LAPAROSCOPIC CHOLECYSTECTOMY  1985   LAPAROSCOPY     fibroids   NISSEN FUNDOPLICATION  1990s   POLYPECTOMY     REVERSE SHOULDER ARTHROPLASTY Left 06/01/2022   Procedure: REVERSE SHOULDER ARTHROPLASTY;  Surgeon: Beverely Low, MD;  Location: [facility] ORS;  Service: Orthopedics;  Laterality: Left;  choice with interscalene block   RIGHT/LEFT  HEART CATH AND CORONARY ANGIOGRAPHY N/A 02/20/2018   Procedure: RIGHT/LEFT HEART CATH AND CORONARY ANGIOGRAPHY;  Surgeon: Lyn Records, MD;  Location: [facility] INVASIVE CV LAB;  Service: Cardiovascular;  Laterality: N/A;   TEE WITHOUT CARDIOVERSION N/A 07/08/2018   Procedure: TRANSESOPHAGEAL ECHOCARDIOGRAM (TEE);  Surgeon: Kathleene Hazel, MD;  Location[facility] OR;  Service: Open Heart Surgery;  Laterality: N/A;   TEE WITHOUT CARDIOVERSION  10/07/2018   TEE WITHOUT CARDIOVERSION N/A 10/07/2018   Procedure: TRANSESOPHAGEAL ECHOCARDIOGRAM (TEE);  Surgeon: Jake Bathe, MD;  Location: [facility] ENDOSCOPY;  Service: Cardiovascular;  Laterality: N/A;   TONSILLECTOMY     age 14   TRANSCATHETER AORTIC VALVE REPLACEMENT, TRANSFEMORAL N/A 07/08/2018   Procedure: TRANSCATHETER AORTIC VALVE REPLACEMENT, TRANSFEMORAL;  Surgeon: Kathleene Hazel, MD;  Location: [facility] OR;  Service: Open Heart Surgery;  Laterality: N/A;    Family History  Adopted: Yes  Problem Relation Age of Onset   Healthy Son        x 2   Headache Other        Cluster headaches   Heart failure Mother    Colon cancer Neg Hx    Pancreatic cancer Neg Hx    Rectal cancer Neg Hx    Stomach cancer Neg Hx     Allergies  Allergen Reactions   Statins Other (See Comments)    Tried simvastatin, atorvastatin; on Repatha    Current Outpatient Medications on File Prior to Visit  Medication Sig Dispense Refill   acetaminophen (TYLENOL) 500 MG tablet Take 1 tablet (500 mg total) by mouth 4 (four) times daily. 30 tablet 0   allopurinol (ZYLOPRIM) 100 MG tablet Take 1 tablet (100 mg total) by mouth daily. 90 tablet 1   ALPRAZolam (XANAX) 0.5 MG tablet Take 1 tablet (0.5 mg total) by mouth 3 (three) times daily as needed for anxiety. 30 tablet 0   amitriptyline (ELAVIL) 25 MG tablet Take 1 tablet (25 mg total) by mouth at bedtime. 90 tablet 1   apixaban (ELIQUIS) 5 MG TABS tablet Take 1 tablet (5 mg total) by mouth 2 (two) times daily. 60  tablet 3   b complex vitamins capsule Take 1 capsule by mouth daily.     Cholecalciferol (D3 PO) Take 1 tablet by mouth daily.     colchicine 0.6 MG tablet Take 1 tablet (0.6 mg total) by mouth daily. as needed for gout 90 tablet  0   diltiazem (CARTIA XT) 120 MG 24 hr capsule Take 1 capsule (120 mg total) by mouth daily. 90 capsule 3   empagliflozin (JARDIANCE) 10 MG TABS tablet Take 1 tablet (10 mg total) by mouth daily. 30 tablet 0   Insulin NPH, Human,, Isophane, (HUMULIN N KWIKPEN) 100 UNIT/ML Kiwkpen Inject 20 units into the skin every morning and 14 units every evening. 15 mL 6   insulin regular (HUMULIN R) 100 units/mL injection Inject 0.1 mLs (10 Units total) into the skin 3 (three) times daily before meals (Patient taking differently: Inject 10-12 Units into the skin 3 (three) times daily. Sliding scale based on testing number) 10 mL 3   metFORMIN (GLUCOPHAGE-XR) 500 MG 24 hr tablet Take 1 tablet by mouth 2 times daily 180 tablet 2   Multiple Vitamin (MULITIVITAMIN WITH MINERALS) TABS Take 1 tablet by mouth daily.     nitroGLYCERIN (NITROSTAT) 0.4 MG SL tablet Place 1 tablet (0.4 mg total) under the tongue every 5 (five) minutes as needed for chest pain. 25 tablet 4   omeprazole (PRILOSEC) 20 MG capsule Take 1 capsule (20 mg total) by mouth daily. 90 capsule 3   ondansetron (ZOFRAN) 4 MG tablet Take 1 tablet (4 mg total) by mouth every 8 (eight) hours as needed for nausea or vomiting. 40 tablet 1   polyethylene glycol (MIRALAX) 17 g packet Take 17 g by mouth daily as needed.     potassium chloride SA (KLOR-CON M) 20 MEQ tablet Take 0.5 tablets (10 mEq total) by mouth daily. Take in concurrence with torsemide.     spironolactone (ALDACTONE) 25 MG tablet Take 0.5 tablets (12.5 mg total) by mouth daily. 15 tablet 0   SYNTHROID 137 MCG tablet Take 1 tablet (137 mcg total) by mouth every morning on empty stomach. 90 tablet 1   tiZANidine (ZANAFLEX) 4 MG tablet Take 1 tablet (4 mg total) by mouth  3 (three) times daily. (Patient taking differently: Take 4 mg by mouth daily as needed for muscle spasms.) 90 tablet 1   torsemide (DEMADEX) 20 MG tablet Take 2 tablets (40 mg total) by mouth daily. 60 tablet 0   Vibegron (GEMTESA) 75 MG TABS Take 75 mg by mouth at bedtime.     vitamin E 180 MG (400 UNITS) capsule Take 400 Units by mouth daily.     No current facility-administered medications on file prior to visit.    BP (!) 120/56   Pulse 71   Temp 98.5 F (36.9 C) (Oral)   SpO2 98%       Objective:   Physical Exam Vitals and nursing note reviewed.  Constitutional:      Appearance: Normal appearance.  Cardiovascular:     Rate and Rhythm: Normal rate and regular rhythm.     Pulses: Normal pulses.     Heart sounds: Normal heart sounds.  Pulmonary:     Effort: Pulmonary effort is normal.     Breath sounds: Normal breath sounds.  Musculoskeletal:        General: Tenderness present. No swelling. Normal range of motion.  Skin:    General: Skin is warm and dry.  Neurological:     General: No focal deficit present.     Mental Status: She is alert and oriented to person, place, and time.     Motor: Weakness present.     Gait: Gait abnormal.  Psychiatric:        Mood and Affect: Mood normal.  Behavior: Behavior normal.        Thought Content: Thought content normal.        Judgment: Judgment normal.       Assessment & Plan:  1. Anxiety (Primary) -This seems to have resolved.  Will DC Xanax from her medication list.   Will do labs from her hospital follow-up visit earlier this week. - Basic Metabolic Panel - CBC  2. Chronic bilateral low back pain without sciatica -Continue with ice.  Her husband has some lidocaine patches at home.  I am going to have him try using this on the patient for her low back pain.  Will follow-up next week once labs are back.  -Like to stay away from opiates to reduce constipation. - She can continue with Tizanidine as needed  3.  Constipation, unspecified constipation type -Resolved. - Basic Metabolic Panel - CBC  Time spent with patient today was 35 minutes which consisted of chart review, discussing low back pain, constipation and anxiety, work up, treatment answering questions and documentation.

## 2023-04-12 NOTE — Patient Instructions (Signed)
It was great seeing you today and I am glad you are feeling better.   We will follow up with you regarding your lab work   Please let me know if you need anything

## 2023-04-12 NOTE — Patient Outreach (Addendum)
  Care Coordination   Follow Up Visit Note   04/12/2023 Name: Breanna Webster MRN: [id number] DOB: 02/06/1946  Breanna Webster is a 77 y.o. year old female who sees Nafziger, Kandee Keen, NP for primary care. I  attempted to reach the pt/spouse via phone today to follow up from previous call. No answer at both numbers listed. Noted in EMR that pt did follow up with PCP as advised during call that same day and has another PCP follow up appt this afternoon. Spouse aware that he can contact RN CM as needed.      Care Coordination Interventions:  No, not indicated   Follow up plan: No further intervention required.   Encounter Outcome:  No Answer

## 2023-04-14 LAB — PROTEIN ELECTROPHORESIS, SERUM
A/G Ratio: 1.2 (ref 0.7–1.7)
Albumin ELP: 3.8 g/dL (ref 2.9–4.4)
Alpha-1-Globulin: 0.2 g/dL (ref 0.0–0.4)
Alpha-2-Globulin: 0.7 g/dL (ref 0.4–1.0)
Beta Globulin: 1.2 g/dL (ref 0.7–1.3)
Gamma Globulin: 1.1 g/dL (ref 0.4–1.8)
Globulin, Total: 3.2 g/dL (ref 2.2–3.9)
Total Protein ELP: 7 g/dL (ref 6.0–8.5)

## 2023-04-15 ENCOUNTER — Ambulatory Visit (HOSPITAL_COMMUNITY): Payer: Medicare Other

## 2023-04-16 ENCOUNTER — Other Ambulatory Visit (HOSPITAL_BASED_OUTPATIENT_CLINIC_OR_DEPARTMENT_OTHER): Payer: Self-pay

## 2023-04-18 NOTE — Telephone Encounter (Addendum)
Called scheduling.  Patient was scheduled for U/S 12-23 but patient cancelled. Called and spoke to patient. She was in the hospital for a week and had a lot of imaging and labs dont so she Didn't think she needed to have the RUQ or the lab work. Please review patient's chart and advise what she needs.(Note: Had CBC and BMET but not an AFP). Had CT Angio Chest/ABD/Pelvis on 03-25-23. Thank you

## 2023-04-19 DIAGNOSIS — R3915 Urgency of urination: Secondary | ICD-10-CM | POA: Diagnosis not present

## 2023-04-19 DIAGNOSIS — R350 Frequency of micturition: Secondary | ICD-10-CM | POA: Diagnosis not present

## 2023-04-19 NOTE — Telephone Encounter (Signed)
Thanks Jan.  She had a contrast CT scan of the CAP in December, no focal liver lesions. Would next need a RUQ [name] for [facility] screening in June 2025 if you can please place a recall. Thanks

## 2023-04-22 ENCOUNTER — Telehealth: Payer: Self-pay

## 2023-04-22 DIAGNOSIS — F419 Anxiety disorder, unspecified: Secondary | ICD-10-CM | POA: Diagnosis not present

## 2023-04-22 DIAGNOSIS — E890 Postprocedural hypothyroidism: Secondary | ICD-10-CM | POA: Diagnosis not present

## 2023-04-22 DIAGNOSIS — D563 Thalassemia minor: Secondary | ICD-10-CM | POA: Diagnosis not present

## 2023-04-22 DIAGNOSIS — M47814 Spondylosis without myelopathy or radiculopathy, thoracic region: Secondary | ICD-10-CM | POA: Diagnosis not present

## 2023-04-22 DIAGNOSIS — I340 Nonrheumatic mitral (valve) insufficiency: Secondary | ICD-10-CM | POA: Diagnosis not present

## 2023-04-22 DIAGNOSIS — E876 Hypokalemia: Secondary | ICD-10-CM | POA: Diagnosis not present

## 2023-04-22 DIAGNOSIS — I5033 Acute on chronic diastolic (congestive) heart failure: Secondary | ICD-10-CM | POA: Diagnosis not present

## 2023-04-22 DIAGNOSIS — E785 Hyperlipidemia, unspecified: Secondary | ICD-10-CM | POA: Diagnosis not present

## 2023-04-22 DIAGNOSIS — G4733 Obstructive sleep apnea (adult) (pediatric): Secondary | ICD-10-CM | POA: Diagnosis not present

## 2023-04-22 DIAGNOSIS — I700 Atherosclerosis of aorta: Secondary | ICD-10-CM | POA: Diagnosis not present

## 2023-04-22 DIAGNOSIS — M109 Gout, unspecified: Secondary | ICD-10-CM | POA: Diagnosis not present

## 2023-04-22 DIAGNOSIS — D508 Other iron deficiency anemias: Secondary | ICD-10-CM | POA: Diagnosis not present

## 2023-04-22 DIAGNOSIS — G319 Degenerative disease of nervous system, unspecified: Secondary | ICD-10-CM | POA: Diagnosis not present

## 2023-04-22 DIAGNOSIS — G8929 Other chronic pain: Secondary | ICD-10-CM | POA: Diagnosis not present

## 2023-04-22 DIAGNOSIS — E871 Hypo-osmolality and hyponatremia: Secondary | ICD-10-CM | POA: Diagnosis not present

## 2023-04-22 DIAGNOSIS — N179 Acute kidney failure, unspecified: Secondary | ICD-10-CM | POA: Diagnosis not present

## 2023-04-22 DIAGNOSIS — K5790 Diverticulosis of intestine, part unspecified, without perforation or abscess without bleeding: Secondary | ICD-10-CM | POA: Diagnosis not present

## 2023-04-22 DIAGNOSIS — E119 Type 2 diabetes mellitus without complications: Secondary | ICD-10-CM | POA: Diagnosis not present

## 2023-04-22 DIAGNOSIS — M47816 Spondylosis without myelopathy or radiculopathy, lumbar region: Secondary | ICD-10-CM | POA: Diagnosis not present

## 2023-04-22 DIAGNOSIS — I480 Paroxysmal atrial fibrillation: Secondary | ICD-10-CM | POA: Diagnosis not present

## 2023-04-22 DIAGNOSIS — G20C Parkinsonism, unspecified: Secondary | ICD-10-CM | POA: Diagnosis not present

## 2023-04-22 DIAGNOSIS — I110 Hypertensive heart disease with heart failure: Secondary | ICD-10-CM | POA: Diagnosis not present

## 2023-04-22 DIAGNOSIS — K7460 Unspecified cirrhosis of liver: Secondary | ICD-10-CM | POA: Diagnosis not present

## 2023-04-22 DIAGNOSIS — I447 Left bundle-branch block, unspecified: Secondary | ICD-10-CM | POA: Diagnosis not present

## 2023-04-22 DIAGNOSIS — F32A Depression, unspecified: Secondary | ICD-10-CM | POA: Diagnosis not present
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
## 2023-04-23 NOTE — Telephone Encounter (Signed)
Called [facility] but no answer. Lm for follow up.

## 2023-04-25 DIAGNOSIS — E871 Hypo-osmolality and hyponatremia: Secondary | ICD-10-CM | POA: Diagnosis not present

## 2023-04-25 DIAGNOSIS — N179 Acute kidney failure, unspecified: Secondary | ICD-10-CM | POA: Diagnosis not present

## 2023-04-25 DIAGNOSIS — E119 Type 2 diabetes mellitus without complications: Secondary | ICD-10-CM | POA: Diagnosis not present

## 2023-04-25 DIAGNOSIS — I5033 Acute on chronic diastolic (congestive) heart failure: Secondary | ICD-10-CM | POA: Diagnosis not present

## 2023-04-25 DIAGNOSIS — I110 Hypertensive heart disease with heart failure: Secondary | ICD-10-CM | POA: Diagnosis not present

## 2023-04-25 DIAGNOSIS — I480 Paroxysmal atrial fibrillation: Secondary | ICD-10-CM | POA: Diagnosis not present

## 2023-04-26 DIAGNOSIS — M545 Low back pain, unspecified: Secondary | ICD-10-CM | POA: Diagnosis not present

## 2023-04-29 ENCOUNTER — Other Ambulatory Visit: Payer: Self-pay | Admitting: Physician Assistant

## 2023-04-29 DIAGNOSIS — M48061 Spinal stenosis, lumbar region without neurogenic claudication: Secondary | ICD-10-CM

## 2023-04-29 NOTE — Telephone Encounter (Signed)
 Left message to return phone call.

## 2023-04-29 NOTE — Progress Notes (Deleted)
 Cardiology Clinic Note   Patient Name: Breanna Webster Date of Encounter: 04/29/2023  Primary Care Provider:  Merna Huxley, NP Primary Cardiologist:  Soyla DELENA Merck, MD  Patient Profile    Breanna Webster 78 year old female presents to the clinic today for evaluation of her lower extremity swelling.  Past Medical History    Past Medical History:  Diagnosis Date   Anxiety    Arthritis    back (04/22/2018)   Atrial fibrillation with RVR (HCC) 10/21/2018   Back pain    Bacteremia due to Streptococcus Salivarius 10/07/2018   Blood transfusion without reported diagnosis    CAD (coronary artery disease)    a. 03/2018 s/p PCI/DES to the RCA (3.0x15 Onyx DES).   Carotid artery stenosis    Mild   Cerebellar stroke, acute (HCC) 10/21/2018   Cerebrovascular accident (CVA) due to embolism of right posterior cerebral artery (HCC) 08/03/2020   Chest pain    CHF (congestive heart failure) (HCC)    Chronic lower back pain    Cirrhosis (HCC)    Colon polyps    Diverticulitis    Diverticulosis    Esophageal thickening    seen on pre TAVR CT scan, also questionable cirrhosis. MRI recommended. Will refer to GI   GERD (gastroesophageal reflux disease)    Gout    Grave's disease    H/O total shoulder replacement, left 06/01/2022   Heart murmur    History of cardioembolic stroke 03/29/2020   History of colonic polyps 05/22/2017   History of hiatal hernia    Hyperlipidemia    Hypertension    Hypothyroidism    IBS (irritable bowel syndrome)    Osteopenia    Parkinson's syndrome (HCC) 04/23/2020   Pulmonary nodules    seen on pre TAVR CT. likley benign. no follow up recommended if pt low risk.   S/P TAVR (transcatheter aortic valve replacement)    Shortness of breath on exertion    Stroke (HCC) 2021   x2   Thalassemia minor    Thyroid  disease    Type II diabetes mellitus (HCC)    Past Surgical History:  Procedure Laterality Date   19 HOUR PH STUDY N/A 03/03/2018    Procedure: 24 HOUR PH STUDY;  Surgeon: Shila Gustav GAILS, MD;  Location: [facility];  Service: Endoscopy;  Laterality: N/A;   COLONOSCOPY  2023   COLONOSCOPY W/ BIOPSIES AND POLYPECTOMY     CORONARY ANGIOGRAPHY Right 04/21/2018   Procedure: CORONARY ANGIOGRAPHY (CATH LAB);  Surgeon: Claudene Victory ORN, MD;  Location: [facility] INVASIVE CV LAB;  Service: Cardiovascular;  Laterality: Right;   CORONARY STENT INTERVENTION N/A 04/22/2018   Procedure: CORONARY STENT INTERVENTION;  Surgeon: Claudene Victory ORN, MD;  Location: [facility] INVASIVE CV LAB;  Service: Cardiovascular;  Laterality: N/A;   DILATION AND CURETTAGE OF UTERUS     ESOPHAGEAL MANOMETRY N/A 03/03/2018   Procedure: ESOPHAGEAL MANOMETRY (EM);  Surgeon: Shila Gustav GAILS, MD;  Location: [facility];  Service: Endoscopy;  Laterality: N/A;   HYSTEROSCOPY     fibroids   LAPAROSCOPIC CHOLECYSTECTOMY  1985   LAPAROSCOPY     fibroids   NISSEN FUNDOPLICATION  1990s   POLYPECTOMY     REVERSE SHOULDER ARTHROPLASTY Left 06/01/2022   Procedure: REVERSE SHOULDER ARTHROPLASTY;  Surgeon: Kay Kemps, MD;  Location: [facility] ORS;  Service: Orthopedics;  Laterality: Left;  choice with interscalene block   RIGHT/LEFT HEART CATH AND CORONARY ANGIOGRAPHY N/A 02/20/2018   Procedure: RIGHT/LEFT HEART CATH AND CORONARY  ANGIOGRAPHY;  Surgeon: Claudene Victory ORN, MD;  Location: [facility] INVASIVE CV LAB;  Service: Cardiovascular;  Laterality: N/A;   TEE WITHOUT CARDIOVERSION N/A 07/08/2018   Procedure: TRANSESOPHAGEAL ECHOCARDIOGRAM (TEE);  Surgeon: Verlin Lonni BIRCH, MD;  Location: [facility] OR;  Service: Open Heart Surgery;  Laterality: N/A;   TEE WITHOUT CARDIOVERSION  10/07/2018   TEE WITHOUT CARDIOVERSION N/A 10/07/2018   Procedure: TRANSESOPHAGEAL ECHOCARDIOGRAM (TEE);  Surgeon: Jeffrie Oneil BROCKS, MD;  Location: [facility] ENDOSCOPY;  Service: Cardiovascular;  Laterality: N/A;   TONSILLECTOMY     age 42   TRANSCATHETER AORTIC VALVE REPLACEMENT, TRANSFEMORAL N/A 07/08/2018   Procedure:  TRANSCATHETER AORTIC VALVE REPLACEMENT, TRANSFEMORAL;  Surgeon: Verlin Lonni BIRCH, MD;  Location: [facility] OR;  Service: Open Heart Surgery;  Laterality: N/A;    Allergies  Allergies  Allergen Reactions   Statins Other (See Comments)    Tried simvastatin , atorvastatin ; on Repatha     History of Present Illness    Breanna Webster has a PMH of CHF, hypertension, hyperlipidemia, Parkinson's, history of CVA, Graves' disease, type 2 diabetes, coronary artery disease status post PCI with DES x 1 to mid RCA 12/19, mitral valve endocarditis 6/20, paroxysmal atrial fibrillation on apixaban , 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  She was seen in follow-up by Orren Fabry, PA-C in 07/08/2023 for dizziness.  It was felt that her symptoms were related to decreased blood pressure.  She was checking her blood pressure with a wrist cuff.  She was encouraged to obtain a new blood pressure cuff.  She was noted to have some lower extremity edema with left greater than right which is chronic.  Prior lower extremity ultrasounds negative for DVT.  Lower extremity compression stockings were recommended however, they were not previously successful.  She was seen again in follow-up by Orren Fabry, PA-C on 08/10/2022.  She reported that she was frustrated with using the bathroom 8-9 times a day with spironolactone .  Her legs were showing less edema.  She reported that she was drinking several cups of water  daily.  She was following a low-no salt diet.  She requested discontinuation of spironolactone .  She was also concerned about her lower blood pressure.  She denied shortness of breath and dyspnea on exertion.  She denied chest pain, orthopnea, PND, and palpitations.  Patient contacted nurse triage line 09/12/2022 and reported intermittent periods of shortness of breath and increased lower extremity swelling with left leg greater than right.  She presented to the  clinic 09/13/2022 for follow-up evaluation and stated she continues to notice lower extremity swelling.  We reviewed the importance of fluid restriction, lower extremity support stockings, and elevating lower extremities to chest height.  She and her husband expressed understanding.  She reported that she had not been consuming extra fluid.  Her husband reported  that she drank several glasses of ice water  daily.  Her blood pressure was 104/60.  Her EKG showed normal sinus rhythm with possible left atrial enlargement 73 bpm.  I  increased her torsemide  to 60 mg for 3 days and then planned to resume normal dosing.  I increased her potassium to 20 mill equivalents for 3 days.  I planned BMP in 1 week, gave 48 ounce fluid restriction and planned follow-up in 1 to 2 weeks.  She presented to the clinic 09/19/22 for follow-up evaluation and stated she tolerated the medication well.  Her weight was unchanged.  She reported that she did not weigh by  scale in the clinic last week.  She reported not measuring fluid intake.  We reviewed the importance of fluid restriction and lower extremity support stockings.  I repeated increased diuresis and increase potassium.  I added Zaroxolyn  2.5 mg for 2 days and planned repeat BMP in 1 week.  I planned follow-up in 2 weeks.  She and her husband expressed understanding.  I  also increased her torsemide  from 30 mg daily to 40 mg daily after increased diuresis.   She presented to the clinic 10/04/22 for follow-up evaluation and stated she felt she still had some swelling in her feet.  Her weight was much better at 176.6 pounds.  Her previous weight was 182 pounds.  She had increased urination with increased diuresis.  She had been wearing lower extremity support stockings.  I helped put them on.  She had some left upper extremity swelling postoperatively.  I encouraged them to use elevation and an upper extremity compression sleeve.  I also recommended that they reach out to orthopedics  for physical therapy recommendations.  I ordered a BMP and planned follow-up in 4 months.  She presented to the clinic 02/06/23 for follow-up evaluation and stated she felt that her lower extremities were swollen and that her left arm was swollen.  We reviewed the importance of fluid restriction and elevating lower extremities as well as using lower extremity support stockings.  She and her husband expressed understanding.  She had been following a low-sodium diet.  She was no longer taking physical therapy for her left upper arm.   We reviewed the importance of keeping her left upper extremity elevated.  Her weight remained stable.  I gave her a weight log, planned to repeat echocardiogram as scheduled, planned follow-up in 6 to 8 months and we also reviewed need for primary cardiologist.  I recommended Dr. Loni.  She was admitted to the hospital on 04/05/2023 and discharged on 04/09/2023.  She reported increased dyspnea.  She presented to the emergency department with complaints of back pain.  She reported that her weight had been stable.  She denied chest pain.  She continues to follow a low-sodium diet.  She was fairly sedentary.  Her BNP was elevated at 400.  Her CT noted some edema.  Her EKG showed normal sinus rhythm with left bundle branch block.  It was recommended that she have IV diuresis during her admission and continue to monitor her weight at home with I/O's.  She was diuresed 1.1 L.  It was recommended that she start SGLT2 inhibitor due to recurrent CHF episodes.  Jardiance  was started.  Echocardiogram 04/06/2023 showed an LVEF of 50-55%, mild concentric left ventricular hypertrophy, G2 DD moderately dilated left atria, mild mitral valve regurgitation, repaired/replaced aortic valve with no regurgitation..  No significant changes from prior echo.  She presents to the clinic today for follow-up evaluation and states***.  Today she denies chest pain, fatigue, palpitations, melena, hematuria,  hemoptysis, diaphoresis, weakness, presyncope, syncope, orthopnea, and PND.    Home Medications    Prior to Admission medications   Medication Sig Start Date End Date Taking? Authorizing Provider  acetaminophen  (TYLENOL ) 500 MG tablet Take 500 mg by mouth every 6 (six) hours as needed for mild pain.    [provider]  acetaminophen  (TYLENOL ) 500 MG tablet Take 2 tablets (1,000 mg total) by mouth every 8 (eight) hours. 09/01/22 10/01/22  Kommor, Madison, MD  apixaban  (ELIQUIS ) 5 MG TABS tablet Take 1 tablet (5 mg total) by  mouth 2 (two) times daily. 01/02/22   Claudene Victory ORN, MD  b complex vitamins capsule Take 1 capsule by mouth daily.    [provider]  BD PEN NEEDLE NANO 2ND GEN 32G X 4 MM MISC  11/26/20   [provider]  cholecalciferol  (VITAMIN D ) 25 MCG (1000 UNIT) tablet Take 1,000 Units by mouth daily.    [provider]  Continuous Blood Gluc Receiver (FREESTYLE LIBRE READER) DEVI Use as directed 03/14/22     Continuous Blood Gluc Sensor (FREESTYLE LIBRE 3 SENSOR) MISC Place one sensor every 14 days 03/14/22     cyclobenzaprine  (FLEXERIL ) 5 MG tablet Take 1 tablet (5 mg total) by mouth every evening. 08/28/22     diltiazem  (CARDIZEM  CD) 120 MG 24 hr capsule Take 1 capsule (120 mg total) by mouth daily. 01/02/22   Claudene Victory ORN, MD  escitalopram  (LEXAPRO ) 20 MG tablet TAKE ONE TABLET BY MOUTH DAILY 06/15/22   Nafziger, Darleene, NP  Evolocumab  (REPATHA  SURECLICK) 140 MG/ML SOAJ Inject 1 dose into the skin every 14 (fourteen) days. 03/27/22   Claudene Victory ORN, MD  insulin  NPH Human (HUMULIN  N) 100 UNIT/ML injection Inject 20 units under the skin in the morning and 14 units in evening 02/20/22     Insulin  NPH, Human,, Isophane, (HUMULIN  N KWIKPEN) 100 UNIT/ML Kiwkpen Inject 20 units into the skin every morning and 14 units every evening. 02/20/22     Insulin  Pen Needle (UNIFINE PENTIPS) 32G X 4 MM MISC Inject 4 times subcutaneously 06/23/21     insulin  regular  (HUMULIN  R) 100 units/mL injection Inject 10 Units total into the skin 3 (three) times daily before meals. 02/20/22     Insulin  Regular Human (NOVOLIN  R FLEXPEN RELION) 100 UNIT/ML KwikPen Inject 5 to 10 units under the skin three times daily. 02/20/22     Insulin  Syringe-Needle U-100 (ULTICARE INSULIN  SYRINGE) 31G X 5/16 1 ML MISC Inject 4 times daily subcutaneously 01/12/22   Nafziger, Cory, NP  metFORMIN  (GLUCOPHAGE -XR) 500 MG 24 hr tablet Take 1 tablet by mouth 2 times daily 08/29/22     Multiple Vitamin (MULITIVITAMIN WITH MINERALS) TABS Take 1 tablet by mouth daily.    [provider]  nitroGLYCERIN  (NITROSTAT ) 0.4 MG SL tablet Place 1 tablet (0.4 mg total) under the tongue every 5 (five) minutes as needed for chest pain. 11/14/21   Claudene Victory ORN, MD  omeprazole  (PRILOSEC) 20 MG capsule Take 1 capsule (20 mg total) by mouth daily. 06/07/22   [name], Elspeth SQUIBB, MD  oxyCODONE  (ROXICODONE ) 5 MG immediate release tablet Take 1 tablet (5 mg total) by mouth every 6 (six) hours as needed for breakthrough pain. 09/01/22   Kommor, Madison, MD  polyethylene glycol (MIRALAX  / GLYCOLAX ) 17 g packet Take 17 g by mouth daily as needed for mild constipation.    [provider]  potassium chloride  SA (KLOR-CON  M) 20 MEQ tablet Take 1 tablet (20 mEq total) by mouth twice daily for 3 days only, then decrease to taking 1 tablet (20 mEq total) by mouth daily thereafter.  Take in concurrence with torsemide . 06/26/22   Hobart Powell BRAVO, MD  pregabalin  (LYRICA ) 75 MG capsule Take 1 capsule (75 mg total) by mouth 2 (two) times daily. 07/05/22     primidone  (MYSOLINE ) 50 MG tablet Take 1 tablet (50 mg total) by mouth every morning. 07/31/22   Dohmeier, Dedra, MD  SYNTHROID  137 MCG tablet Take 1 tablet (137 mcg total) by mouth every morning  on empty stomach. 08/01/22     torsemide  (DEMADEX ) 20 MG tablet Take 1.5 tablets (30 mg total) by mouth daily. 08/10/22   Lucien Orren SAILOR, PA-C    Family History     Family History  Adopted: Yes  Problem Relation Age of Onset   Healthy Son        x 2   Headache Other        Cluster headaches   Heart failure Mother    Colon cancer Neg Hx    Pancreatic cancer Neg Hx    Rectal cancer Neg Hx    Stomach cancer Neg Hx    is adopted.   Social History    Social History   Socioeconomic History   Marital status: Married    Spouse name: Not on file   Number of children: 2   Years of education: Not on file   Highest education level: Associate degree: academic program  Occupational History   Occupation: Health And Safety Inspector of the [employer]  Tobacco Use   Smoking status: Never    Passive exposure: Never   Smokeless tobacco: Never  Vaping Use   Vaping status: Never Used  Substance and Sexual Activity   Alcohol use: No    Alcohol/week: 0.0 standard drinks of alcohol   Drug use: Never   Sexual activity: Not Currently  Other Topics Concern   Not on file  Social History Narrative   Lives with husband in a one story home.     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   Education: college.   Social Drivers of Health   Financial Resource Strain: Patient Declined (12/09/2022)   Overall Financial Resource Strain (CARDIA)    Difficulty of Paying Living Expenses: Patient declined  Food Insecurity: Patient Unable To Answer (04/10/2023)   Hunger Vital Sign    Worried About Running Out of Food in the Last Year: Patient unable to answer    Ran Out of Food in the Last Year: Patient unable to answer  Transportation Needs: No Transportation Needs (04/05/2023)   PRAPARE - Administrator, Civil Service (Medical): No    Lack of Transportation (Non-Medical): No  Physical Activity: Unknown (12/09/2022)   Exercise Vital Sign    Days of Exercise per Week: Patient declined    Minutes of Exercise per Session: 40 min  Stress: No Stress Concern Present (12/09/2022)   [employer] - Occupational Stress Questionnaire    Feeling of Stress : Only a little  Social Connections: Unknown (12/09/2022)   Social Connection and Isolation Panel [NHANES]    Frequency of Communication with Friends and Family: More than three times a week    Frequency of Social Gatherings with Friends and Family: Patient declined    Attends Religious Services: Patient declined    Database Administrator or Organizations: Patient declined    Attends Engineer, Structural: More than 4 times per year    Marital Status: Married  Catering Manager Violence: Patient Unable To Answer (04/10/2023)   Humiliation, Afraid, Rape, and Kick questionnaire    Fear of Current or Ex-Partner: Patient unable to answer    Emotionally Abused: Patient unable to answer    Physically Abused: Patient unable to answer    Sexually Abused: Patient unable to answer     Review of Systems    General:  No chills, fever, night sweats or weight  changes.  Cardiovascular:  No chest pain, dyspnea on exertion, edema, orthopnea, palpitations, paroxysmal nocturnal dyspnea. Dermatological: No rash, lesions/masses Respiratory: No cough, dyspnea Urologic: No hematuria, dysuria Abdominal:   No nausea, vomiting, diarrhea, bright red blood per rectum, melena, or hematemesis Neurologic:  No visual changes, wkns, changes in mental status. All other systems reviewed and are otherwise negative except as noted above.  Physical Exam    VS:  There were no vitals taken for this visit. , BMI There is no height or weight on file to calculate BMI. GEN: Well nourished, well developed, in no acute distress. HEENT: normal. Neck: Supple, no JVD, carotid bruits, or masses. Cardiac: RRR, no murmurs, rubs, or gallops. No clubbing, cyanosis, generalized bilateral lower extremity -1+ stable.  Radials/DP/PT 2+ and equal bilaterally.  Respiratory:  Respirations regular and unlabored, clear to auscultation  bilaterally. GI: Soft, nontender, nondistended, BS + x 4. MS: no deformity or atrophy. Skin: warm and dry, no rash. Neuro:  Strength and sensation are intact. Psych: Normal affect.  Accessory Clinical Findings    Recent Labs: 08/31/2022: NT-Pro BNP 511 04/04/2023: B Natriuretic Peptide 401.3 04/05/2023: ALT 23 04/09/2023: Magnesium  2.4 04/12/2023: BUN 21; Creatinine, Ser 1.03; Hemoglobin 10.6; Platelets 181.0 Repeated and verified X2.; Potassium 3.8; Sodium 137   Recent Lipid Panel    Component Value Date/Time   CHOL 177 02/07/2023 1307   TRIG 150 (H) 02/07/2023 1307   HDL 46 02/07/2023 1307   CHOLHDL 3.8 02/07/2023 1307   CHOLHDL 2.1 09/06/2021 1018   VLDL 20 09/06/2021 1018   LDLCALC 105 (H) 02/07/2023 1307   LDLDIRECT 98.0 05/24/2014 1343    No BP recorded.  {Refresh Note OR Click here to enter BP  :1}***    ECG personally reviewed by me today-none today.  EKG 09/13/22 normal sinus rhythm possible left atrial enlargement left axis deviation left bundle branch block 73 bpm- No acute changes   Echocardiogram 07/17/2022  IMPRESSIONS     1. Left ventricular ejection fraction, by estimation, is 60 to 65%. The  left ventricle has normal function. The left ventricle has no regional  wall motion abnormalities. Diastolic function is indeterminant due to  severe MAC.   2. Right ventricular systolic function was not well visualized. The right  ventricular size is not well visualized.   3. Left atrial size was moderately dilated.   4. The mitral valve is degenerative. Trivial mitral valve regurgitation.  Mild calcific mitral stenosis with mean gradient of at HR 68bpm.  Severe mitral annular calcification.   5. The aortic valve has been repaired/replaced. Aortic valve  regurgitation is not visualized. There is a 23 mm Sapien prosthetic (TAVR)  valve present in the aortic position. Procedure Date: 07/08/18. Echo  findings are consistent with normal structure and    function of the aortic valve prosthesis. Aortic valve mean gradient  measures 16.3 mmHg. Aortic valve Vmax measures 2.64 m/s.   6. The inferior vena cava is normal in size with greater than 50%  respiratory variability, suggesting right atrial pressure of 3 mmHg.   Comparison(s): No significant change from prior study.    2D Doppler echocardiogram 2022: IMPRESSIONS   1. Left ventricular ejection fraction, by estimation, is 60 to 65%. The  left ventricle has normal function. The left ventricle has no regional  wall motion abnormalities. There is mild left ventricular hypertrophy.  Left ventricular diastolic parameters  are consistent with Grade I diastolic dysfunction (impaired relaxation).  The average left ventricular global longitudinal  strain is -19.9 %. The  global longitudinal strain is normal.   2. Right ventricular systolic function is normal. The right ventricular  size is normal.   3. Left atrial size was moderately dilated.   4. The mitral valve is normal in structure. No evidence of mitral valve  regurgitation. Mild mitral stenosis. The mean mitral valve gradient is 4.5  mmHg. Severe mitral annular calcification.   5. The aortic valve has been repaired/replaced. Aortic valve  regurgitation is not visualized. No aortic stenosis is present. There is a  23 mm Edwards Sapien prosthetic (TAVR) valve present in the aortic  position. Echo findings are consistent with normal   structure and function of the aortic valve prosthesis. Aortic valve area,  by VTI measures 1.99 cm. Aortic valve mean gradient measures 15.0 mmHg.  Aortic valve Vmax measures 2.43 m/s.   6. The inferior vena cava is normal in size with greater than 50%  respiratory variability, suggesting right atrial pressure of 3 mmHg.   Comparison(s): No significant change from prior study. Prior images  reviewed side by side.   Echocardiogram 04/06/2023  IMPRESSIONS     1. Left ventricular ejection fraction, by  estimation, is 50 to 55%. The  left ventricle has low normal function. The left ventricle has no regional  wall motion abnormalities. There is mild concentric left ventricular  hypertrophy. Left ventricular  diastolic parameters are consistent with Grade II diastolic dysfunction  (pseudonormalization). Elevated left atrial pressure.   2. Right ventricular systolic function is normal. The right ventricular  size is normal. Tricuspid regurgitation signal is inadequate for assessing  PA pressure.   3. Left atrial size was moderately dilated.   4. The mitral valve is degenerative. Mild mitral valve regurgitation. No  evidence of mitral stenosis. Severe mitral annular calcification.   5. The aortic valve has been repaired/replaced. Aortic valve  regurgitation is not visualized. There is a valve present in the aortic  position. Aortic valve mean gradient measures 10.0 mmHg. Aortic valve Vmax  measures 2.16 m/s. Aortic valve acceleration  time measures 106 msec.   6. The inferior vena cava is normal in size with greater than 50%  respiratory variability, suggesting right atrial pressure of 3 mmHg.   Comparison(s): No significant change from prior study. Prior images  reviewed side by side.   FINDINGS   Left Ventricle: Left ventricular ejection fraction, by estimation, is 50  to 55%. The left ventricle has low normal function. The left ventricle has  no regional wall motion abnormalities. Definity  contrast agent was given  IV to delineate the left  ventricular endocardial borders. The left ventricular internal cavity size  was normal in size. There is mild concentric left ventricular hypertrophy.  Abnormal (paradoxical) septal motion, consistent with left bundle branch  block. Left ventricular  diastolic parameters are consistent with Grade II diastolic dysfunction  (pseudonormalization). Elevated left atrial pressure.   Right Ventricle: The right ventricular size is normal. No increase in   right ventricular wall thickness. Right ventricular systolic function is  normal. Tricuspid regurgitation signal is inadequate for assessing PA  pressure.   Left Atrium: Left atrial size was moderately dilated.   Right Atrium: Right atrial size was normal in size.   Pericardium: There is no evidence of pericardial effusion.   Mitral Valve: The mitral valve is degenerative in appearance. Severe  mitral annular calcification. Mild mitral valve regurgitation, with  centrally-directed jet. No evidence of mitral valve stenosis.   Tricuspid Valve: The tricuspid  valve is grossly normal. Tricuspid valve  regurgitation is not demonstrated.   Aortic Valve: The aortic valve has been repaired/replaced. Aortic valve  regurgitation is not visualized. Aortic valve mean gradient measures 10.0  mmHg. Aortic valve peak gradient measures 18.6 mmHg. Aortic valve area, by  VTI measures 1.38 cm. There is a   valve present in the aortic position.   Pulmonic Valve: The pulmonic valve was not well visualized.   Aorta: The aortic root and ascending aorta are structurally normal, with  no evidence of dilitation.   Venous: The inferior vena cava is normal in size with greater than 50%  respiratory variability, suggesting right atrial pressure of 3 mmHg.   IAS/Shunts: No atrial level shunt detected by color flow Doppler.     Assessment & Plan   1.  Acute on chronic diastolic CHF -weight today***.  ED weight 176.  Generalized-1+ left greater than right stable lower extremity edema.  Echocardiogram 04/06/2023 reassuring. Continue torsemide  , potassium, Jardiance  Fluid restriction-48 ounces or less daily-continue Daily weights-contact office with a weight increase of 2 to 3 pounds overnight or 5 pounds in 1 week-compliant with weight log Continue lower extremity support stockings Continue to elevate lower extremities when not active Increase physical activity as tolerated  Paroxysmal atrial  fibrillation-heart rate today 6***8.  No bleeding issues.  Continues to be compliant with apixaban .   Continue apixaban , diltiazem  Avoid triggers caffeine , chocolate, EtOH, dehydration etc.-again reviewed  Coronary artery disease-denies anginal type symptoms.  With cardiac catheterization with PCI and DES to mid RCA 12/19. Continue current medical therapy Low-sodium diet Increase physical activity as tolerated   Aortic stenosis-status post TAVR.  Breathing at baseline.  Sedentary.  Echocardiogram showed repaired/replaced aortic valve with a peak gradient of 18.6 mmHg and no regurgitation   Disposition: Follow-up with Dr. Loni or me in 4-6 months.  Josefa HERO. Jasani Lengel NP-C     04/29/2023, 1:08 PM [facility] [address] Office [phone] Fax [phone]    I spent 14*** minutes examining this patient, reviewing medications, and using patient centered shared decision making involving her cardiac care.  Prior to her visit I spent greater than 20 minutes reviewing her past medical history,  medications, and prior cardiac tests.

## 2023-04-30 ENCOUNTER — Telehealth: Payer: Self-pay

## 2023-04-30 DIAGNOSIS — M25512 Pain in left shoulder: Secondary | ICD-10-CM | POA: Diagnosis not present

## 2023-04-30 DIAGNOSIS — R5383 Other fatigue: Secondary | ICD-10-CM | POA: Diagnosis not present

## 2023-04-30 DIAGNOSIS — M79642 Pain in left hand: Secondary | ICD-10-CM | POA: Diagnosis not present

## 2023-04-30 DIAGNOSIS — E663 Overweight: Secondary | ICD-10-CM | POA: Diagnosis not present

## 2023-04-30 DIAGNOSIS — M2560 Stiffness of unspecified joint, not elsewhere classified: Secondary | ICD-10-CM | POA: Diagnosis not present

## 2023-04-30 DIAGNOSIS — Z6827 Body mass index (BMI) 27.0-27.9, adult: Secondary | ICD-10-CM | POA: Diagnosis not present

## 2023-04-30 DIAGNOSIS — M79641 Pain in right hand: Secondary | ICD-10-CM | POA: Diagnosis not present

## 2023-04-30 DIAGNOSIS — M2540 Effusion, unspecified joint: Secondary | ICD-10-CM | POA: Diagnosis not present

## 2023-04-30 DIAGNOSIS — M810 Age-related osteoporosis without current pathological fracture: Secondary | ICD-10-CM | POA: Diagnosis not present

## 2023-04-30 DIAGNOSIS — M109 Gout, unspecified: Secondary | ICD-10-CM | POA: Diagnosis not present

## 2023-04-30 NOTE — Telephone Encounter (Signed)
 Copied from CRM [id number]. Topic: Clinical - Home Health Verbal Orders >> Apr 30, 2023 11:17 AM Graeme ORN wrote: Caller/Agency: Marval Sacks Social Worker [facility]  Callback Number: [phone] Service Requested: N/A Update call. Caller provider update for missed appointment on 1/3. States missed appt due to husband saying patient was not lucid and in pain and he was going to call 911. They have checked and she was not taking to hospital. She spoke with spouse again today and he said she is lucid now but he did not want to make next appt. Advised to call him back on Friday.  Frequency: N/A Any new concerns about the patient? Yes

## 2023-04-30 NOTE — Telephone Encounter (Signed)
 Spoke to Dibbie and she stated that husband continues to change appointment dates and are unable to see pt. Dibbie is worried about pt mental and physical status. I will call pt to see what's going on.

## 2023-04-30 NOTE — Telephone Encounter (Signed)
 Spoke to pt spouse and he stated that he does not wont social work for pt but he will let PT come in to help. He articulated that the social worker doesn't seem genuine and he does not care of social work to be given to pt. I called to get the update mental and pain status of pt. Per pt spouse  of course she still in pain we will get Xrays tomorrow and you can speak to her but she is lucid but that's nothing new. Spouse claimed that he has multiple appts set up this week to help the pt. Will update [facility] and neurologist.

## 2023-05-01 ENCOUNTER — Ambulatory Visit: Payer: Medicare Other | Admitting: General Practice

## 2023-05-01 DIAGNOSIS — R918 Other nonspecific abnormal finding of lung field: Secondary | ICD-10-CM | POA: Diagnosis not present

## 2023-05-01 DIAGNOSIS — N179 Acute kidney failure, unspecified: Secondary | ICD-10-CM | POA: Diagnosis present

## 2023-05-01 DIAGNOSIS — I350 Nonrheumatic aortic (valve) stenosis: Secondary | ICD-10-CM | POA: Diagnosis present

## 2023-05-01 DIAGNOSIS — R0689 Other abnormalities of breathing: Secondary | ICD-10-CM | POA: Diagnosis not present

## 2023-05-01 DIAGNOSIS — Z79899 Other long term (current) drug therapy: Secondary | ICD-10-CM | POA: Diagnosis not present

## 2023-05-01 DIAGNOSIS — I480 Paroxysmal atrial fibrillation: Secondary | ICD-10-CM | POA: Diagnosis present

## 2023-05-01 DIAGNOSIS — E039 Hypothyroidism, unspecified: Secondary | ICD-10-CM | POA: Diagnosis not present

## 2023-05-01 DIAGNOSIS — R9431 Abnormal electrocardiogram [ECG] [EKG]: Secondary | ICD-10-CM | POA: Diagnosis not present

## 2023-05-01 DIAGNOSIS — K5900 Constipation, unspecified: Secondary | ICD-10-CM | POA: Diagnosis present

## 2023-05-01 DIAGNOSIS — E119 Type 2 diabetes mellitus without complications: Secondary | ICD-10-CM | POA: Diagnosis not present

## 2023-05-01 DIAGNOSIS — F411 Generalized anxiety disorder: Secondary | ICD-10-CM | POA: Diagnosis present

## 2023-05-01 DIAGNOSIS — I2510 Atherosclerotic heart disease of native coronary artery without angina pectoris: Secondary | ICD-10-CM | POA: Diagnosis present

## 2023-05-01 DIAGNOSIS — R296 Repeated falls: Secondary | ICD-10-CM | POA: Diagnosis not present

## 2023-05-01 DIAGNOSIS — Z794 Long term (current) use of insulin: Secondary | ICD-10-CM | POA: Diagnosis not present

## 2023-05-01 DIAGNOSIS — W19XXXA Unspecified fall, initial encounter: Secondary | ICD-10-CM | POA: Diagnosis not present

## 2023-05-01 DIAGNOSIS — I5030 Unspecified diastolic (congestive) heart failure: Secondary | ICD-10-CM | POA: Diagnosis not present

## 2023-05-01 DIAGNOSIS — R519 Headache, unspecified: Secondary | ICD-10-CM | POA: Diagnosis not present

## 2023-05-01 DIAGNOSIS — Z7984 Long term (current) use of oral hypoglycemic drugs: Secondary | ICD-10-CM | POA: Diagnosis not present

## 2023-05-01 DIAGNOSIS — R531 Weakness: Secondary | ICD-10-CM | POA: Diagnosis not present

## 2023-05-01 DIAGNOSIS — M25512 Pain in left shoulder: Secondary | ICD-10-CM | POA: Diagnosis not present

## 2023-05-01 DIAGNOSIS — I10 Essential (primary) hypertension: Secondary | ICD-10-CM | POA: Diagnosis not present

## 2023-05-01 DIAGNOSIS — R4182 Altered mental status, unspecified: Secondary | ICD-10-CM | POA: Diagnosis not present

## 2023-05-01 DIAGNOSIS — F330 Major depressive disorder, recurrent, mild: Secondary | ICD-10-CM | POA: Diagnosis present

## 2023-05-01 DIAGNOSIS — R109 Unspecified abdominal pain: Secondary | ICD-10-CM | POA: Diagnosis not present

## 2023-05-01 DIAGNOSIS — M545 Low back pain, unspecified: Secondary | ICD-10-CM | POA: Diagnosis not present

## 2023-05-01 DIAGNOSIS — K219 Gastro-esophageal reflux disease without esophagitis: Secondary | ICD-10-CM | POA: Diagnosis present

## 2023-05-01 DIAGNOSIS — E860 Dehydration: Secondary | ICD-10-CM | POA: Diagnosis present

## 2023-05-01 DIAGNOSIS — M549 Dorsalgia, unspecified: Secondary | ICD-10-CM | POA: Diagnosis not present

## 2023-05-01 DIAGNOSIS — Y92009 Unspecified place in unspecified non-institutional (private) residence as the place of occurrence of the external cause: Secondary | ICD-10-CM | POA: Diagnosis not present

## 2023-05-01 DIAGNOSIS — R457 State of emotional shock and stress, unspecified: Secondary | ICD-10-CM | POA: Diagnosis not present

## 2023-05-01 DIAGNOSIS — Z7901 Long term (current) use of anticoagulants: Secondary | ICD-10-CM | POA: Diagnosis not present

## 2023-05-01 DIAGNOSIS — I110 Hypertensive heart disease with heart failure: Secondary | ICD-10-CM | POA: Diagnosis present

## 2023-05-01 DIAGNOSIS — I5032 Chronic diastolic (congestive) heart failure: Secondary | ICD-10-CM | POA: Diagnosis present

## 2023-05-01 DIAGNOSIS — G9341 Metabolic encephalopathy: Secondary | ICD-10-CM | POA: Diagnosis present

## 2023-05-01 DIAGNOSIS — R079 Chest pain, unspecified: Secondary | ICD-10-CM | POA: Diagnosis not present

## 2023-05-01 DIAGNOSIS — G4733 Obstructive sleep apnea (adult) (pediatric): Secondary | ICD-10-CM | POA: Diagnosis present

## 2023-05-01 DIAGNOSIS — M109 Gout, unspecified: Secondary | ICD-10-CM | POA: Diagnosis present

## 2023-05-01 DIAGNOSIS — S39012A Strain of muscle, fascia and tendon of lower back, initial encounter: Secondary | ICD-10-CM | POA: Diagnosis not present

## 2023-05-01 DIAGNOSIS — F410 Panic disorder [episodic paroxysmal anxiety] without agoraphobia: Secondary | ICD-10-CM | POA: Diagnosis present

## 2023-05-01 DIAGNOSIS — Z8673 Personal history of transient ischemic attack (TIA), and cerebral infarction without residual deficits: Secondary | ICD-10-CM | POA: Diagnosis not present

## 2023-05-02 ENCOUNTER — Ambulatory Visit: Payer: Medicare Other | Admitting: General Practice

## 2023-05-02 DIAGNOSIS — M109 Gout, unspecified: Secondary | ICD-10-CM | POA: Diagnosis present

## 2023-05-02 DIAGNOSIS — G9341 Metabolic encephalopathy: Secondary | ICD-10-CM | POA: Diagnosis present

## 2023-05-02 DIAGNOSIS — Z7984 Long term (current) use of oral hypoglycemic drugs: Secondary | ICD-10-CM | POA: Diagnosis not present

## 2023-05-02 DIAGNOSIS — E039 Hypothyroidism, unspecified: Secondary | ICD-10-CM | POA: Diagnosis present

## 2023-05-02 DIAGNOSIS — W19XXXA Unspecified fall, initial encounter: Secondary | ICD-10-CM | POA: Diagnosis not present

## 2023-05-02 DIAGNOSIS — I5030 Unspecified diastolic (congestive) heart failure: Secondary | ICD-10-CM | POA: Diagnosis not present

## 2023-05-02 DIAGNOSIS — G4733 Obstructive sleep apnea (adult) (pediatric): Secondary | ICD-10-CM | POA: Diagnosis present

## 2023-05-02 DIAGNOSIS — Z79899 Other long term (current) drug therapy: Secondary | ICD-10-CM | POA: Diagnosis not present

## 2023-05-02 DIAGNOSIS — Z8673 Personal history of transient ischemic attack (TIA), and cerebral infarction without residual deficits: Secondary | ICD-10-CM | POA: Diagnosis not present

## 2023-05-02 DIAGNOSIS — Z7901 Long term (current) use of anticoagulants: Secondary | ICD-10-CM | POA: Diagnosis not present

## 2023-05-02 DIAGNOSIS — N179 Acute kidney failure, unspecified: Secondary | ICD-10-CM | POA: Diagnosis present

## 2023-05-02 DIAGNOSIS — I350 Nonrheumatic aortic (valve) stenosis: Secondary | ICD-10-CM | POA: Diagnosis present

## 2023-05-02 DIAGNOSIS — F410 Panic disorder [episodic paroxysmal anxiety] without agoraphobia: Secondary | ICD-10-CM | POA: Diagnosis present

## 2023-05-02 DIAGNOSIS — R531 Weakness: Secondary | ICD-10-CM | POA: Diagnosis not present

## 2023-05-02 DIAGNOSIS — F330 Major depressive disorder, recurrent, mild: Secondary | ICD-10-CM | POA: Diagnosis present

## 2023-05-02 DIAGNOSIS — R9431 Abnormal electrocardiogram [ECG] [EKG]: Secondary | ICD-10-CM | POA: Diagnosis present

## 2023-05-02 DIAGNOSIS — K5900 Constipation, unspecified: Secondary | ICD-10-CM | POA: Diagnosis present

## 2023-05-02 DIAGNOSIS — E119 Type 2 diabetes mellitus without complications: Secondary | ICD-10-CM | POA: Diagnosis present

## 2023-05-02 DIAGNOSIS — I2510 Atherosclerotic heart disease of native coronary artery without angina pectoris: Secondary | ICD-10-CM | POA: Diagnosis present

## 2023-05-02 DIAGNOSIS — E860 Dehydration: Secondary | ICD-10-CM | POA: Diagnosis present

## 2023-05-02 DIAGNOSIS — R109 Unspecified abdominal pain: Secondary | ICD-10-CM | POA: Diagnosis not present

## 2023-05-02 DIAGNOSIS — K219 Gastro-esophageal reflux disease without esophagitis: Secondary | ICD-10-CM | POA: Diagnosis present

## 2023-05-02 DIAGNOSIS — F411 Generalized anxiety disorder: Secondary | ICD-10-CM | POA: Diagnosis present

## 2023-05-02 DIAGNOSIS — I10 Essential (primary) hypertension: Secondary | ICD-10-CM | POA: Diagnosis not present

## 2023-05-02 DIAGNOSIS — I5032 Chronic diastolic (congestive) heart failure: Secondary | ICD-10-CM | POA: Diagnosis present

## 2023-05-02 DIAGNOSIS — Z794 Long term (current) use of insulin: Secondary | ICD-10-CM | POA: Diagnosis not present

## 2023-05-02 DIAGNOSIS — I110 Hypertensive heart disease with heart failure: Secondary | ICD-10-CM | POA: Diagnosis present

## 2023-05-02 DIAGNOSIS — I480 Paroxysmal atrial fibrillation: Secondary | ICD-10-CM | POA: Diagnosis present

## 2023-05-02 DIAGNOSIS — Y92009 Unspecified place in unspecified non-institutional (private) residence as the place of occurrence of the external cause: Secondary | ICD-10-CM | POA: Diagnosis not present

## 2023-05-03 ENCOUNTER — Ambulatory Visit (HOSPITAL_COMMUNITY): Payer: Medicare Other

## 2023-05-05 DIAGNOSIS — F410 Panic disorder [episodic paroxysmal anxiety] without agoraphobia: Secondary | ICD-10-CM | POA: Diagnosis not present

## 2023-05-05 DIAGNOSIS — F411 Generalized anxiety disorder: Secondary | ICD-10-CM | POA: Diagnosis not present

## 2023-05-06 ENCOUNTER — Telehealth: Payer: Self-pay | Admitting: Neurology

## 2023-05-06 ENCOUNTER — Ambulatory Visit: Payer: Medicare Other | Admitting: Neurology

## 2023-05-06 NOTE — Telephone Encounter (Signed)
 Spouse reports pt has been in [facility] for the last 5 days, being moved into a facility either today or tomorrow. Phone rep informed spouse there is no appointment for today to cancel

## 2023-05-06 NOTE — Telephone Encounter (Signed)
 noted

## 2023-05-08 ENCOUNTER — Telehealth: Payer: Self-pay

## 2023-05-08 DIAGNOSIS — I110 Hypertensive heart disease with heart failure: Secondary | ICD-10-CM | POA: Diagnosis not present

## 2023-05-08 DIAGNOSIS — K5900 Constipation, unspecified: Secondary | ICD-10-CM | POA: Diagnosis not present

## 2023-05-08 DIAGNOSIS — Z794 Long term (current) use of insulin: Secondary | ICD-10-CM | POA: Diagnosis not present

## 2023-05-08 DIAGNOSIS — Z7901 Long term (current) use of anticoagulants: Secondary | ICD-10-CM | POA: Diagnosis not present

## 2023-05-08 DIAGNOSIS — F330 Major depressive disorder, recurrent, mild: Secondary | ICD-10-CM | POA: Diagnosis not present

## 2023-05-08 DIAGNOSIS — S39012D Strain of muscle, fascia and tendon of lower back, subsequent encounter: Secondary | ICD-10-CM | POA: Diagnosis not present

## 2023-05-08 DIAGNOSIS — Z8673 Personal history of transient ischemic attack (TIA), and cerebral infarction without residual deficits: Secondary | ICD-10-CM | POA: Diagnosis not present

## 2023-05-08 DIAGNOSIS — I509 Heart failure, unspecified: Secondary | ICD-10-CM | POA: Diagnosis not present

## 2023-05-08 DIAGNOSIS — Z952 Presence of prosthetic heart valve: Secondary | ICD-10-CM | POA: Diagnosis not present

## 2023-05-08 DIAGNOSIS — E039 Hypothyroidism, unspecified: Secondary | ICD-10-CM | POA: Diagnosis not present

## 2023-05-08 DIAGNOSIS — F411 Generalized anxiety disorder: Secondary | ICD-10-CM | POA: Diagnosis not present

## 2023-05-08 DIAGNOSIS — G4733 Obstructive sleep apnea (adult) (pediatric): Secondary | ICD-10-CM | POA: Diagnosis not present

## 2023-05-08 DIAGNOSIS — M109 Gout, unspecified: Secondary | ICD-10-CM | POA: Diagnosis not present

## 2023-05-08 DIAGNOSIS — Z7984 Long term (current) use of oral hypoglycemic drugs: Secondary | ICD-10-CM | POA: Diagnosis not present

## 2023-05-08 DIAGNOSIS — K219 Gastro-esophageal reflux disease without esophagitis: Secondary | ICD-10-CM | POA: Diagnosis not present

## 2023-05-08 DIAGNOSIS — I480 Paroxysmal atrial fibrillation: Secondary | ICD-10-CM | POA: Diagnosis not present

## 2023-05-08 DIAGNOSIS — E119 Type 2 diabetes mellitus without complications: Secondary | ICD-10-CM | POA: Diagnosis not present

## 2023-05-08 DIAGNOSIS — Z9181 History of falling: Secondary | ICD-10-CM | POA: Diagnosis not present

## 2023-05-08 DIAGNOSIS — N179 Acute kidney failure, unspecified: Secondary | ICD-10-CM | POA: Diagnosis not present

## 2023-05-08 DIAGNOSIS — E860 Dehydration: Secondary | ICD-10-CM | POA: Diagnosis not present

## 2023-05-08 DIAGNOSIS — I2510 Atherosclerotic heart disease of native coronary artery without angina pectoris: Secondary | ICD-10-CM | POA: Diagnosis not present

## 2023-05-08 NOTE — Transitions of Care (Post Inpatient/ED Visit) (Signed)
   05/08/2023  Name: NEEHARIKA KLEMME MRN: [id number] DOB: 16-May-1945  Today's TOC FU Call Status: Today's TOC FU Call Status:: Successful TOC FU Call Completed TOC FU Call Complete Date: 05/08/23 (husband unable to finish call due to pt being in the bathroom) Patient's Name and Date of Birth confirmed.  Transition Care Management Follow-up Telephone Call Date of Discharge: 05/07/23 Discharge Facility: Other ([facility]) Name of Other ([facility]) Discharge Facility: [facility] Type of Discharge: Inpatient Admission Primary Inpatient Discharge Diagnosis:: fall How have you been since you were released from the hospital?: Same Any questions or concerns?: Yes Patient Questions/Concerns:: Husband reports that home health is coming today  Items Reviewed: Did you receive and understand the discharge instructions provided?: Yes (husband is unsure)     Home Care and Equipment/Supplies: Were Home Health Services Ordered?: Yes Name of Home Health Agency:: [facility] Has Agency set up a time to come to your home?: Yes First Home Health Visit Date: 05/08/23   Placed 2nd call to patient and husband. Husband reports amedysis is in the home now and he does not need this call. Denies future calls. Reports that he has follow up scheduled with PCP.   I will not call patient back, I did provide my contact information if patient wants to call me back.   Orpha Blade, RN, BSN, CEN [employer]- Transition of Care Team.  [facility] [phone]

## 2023-05-10 ENCOUNTER — Other Ambulatory Visit: Payer: Medicare Other

## 2023-05-10 ENCOUNTER — Encounter: Payer: Self-pay | Admitting: Hematology

## 2023-05-10 ENCOUNTER — Telehealth: Payer: Self-pay

## 2023-05-10 DIAGNOSIS — E1165 Type 2 diabetes mellitus with hyperglycemia: Secondary | ICD-10-CM | POA: Diagnosis not present

## 2023-05-10 DIAGNOSIS — I10 Essential (primary) hypertension: Secondary | ICD-10-CM | POA: Diagnosis not present

## 2023-05-10 DIAGNOSIS — I5042 Chronic combined systolic (congestive) and diastolic (congestive) heart failure: Secondary | ICD-10-CM | POA: Diagnosis not present

## 2023-05-10 DIAGNOSIS — I509 Heart failure, unspecified: Secondary | ICD-10-CM | POA: Diagnosis not present

## 2023-05-10 DIAGNOSIS — I110 Hypertensive heart disease with heart failure: Secondary | ICD-10-CM | POA: Diagnosis not present

## 2023-05-10 DIAGNOSIS — E039 Hypothyroidism, unspecified: Secondary | ICD-10-CM | POA: Diagnosis not present

## 2023-05-10 DIAGNOSIS — S39012D Strain of muscle, fascia and tendon of lower back, subsequent encounter: Secondary | ICD-10-CM | POA: Diagnosis not present

## 2023-05-10 DIAGNOSIS — N179 Acute kidney failure, unspecified: Secondary | ICD-10-CM | POA: Diagnosis not present

## 2023-05-10 DIAGNOSIS — E782 Mixed hyperlipidemia: Secondary | ICD-10-CM | POA: Diagnosis not present

## 2023-05-10 DIAGNOSIS — I2510 Atherosclerotic heart disease of native coronary artery without angina pectoris: Secondary | ICD-10-CM | POA: Diagnosis not present

## 2023-05-10 DIAGNOSIS — E860 Dehydration: Secondary | ICD-10-CM | POA: Diagnosis not present

## 2023-05-10 NOTE — Telephone Encounter (Signed)
 error

## 2023-05-14 DIAGNOSIS — I110 Hypertensive heart disease with heart failure: Secondary | ICD-10-CM | POA: Diagnosis not present

## 2023-05-14 DIAGNOSIS — N179 Acute kidney failure, unspecified: Secondary | ICD-10-CM | POA: Diagnosis not present

## 2023-05-14 DIAGNOSIS — I509 Heart failure, unspecified: Secondary | ICD-10-CM | POA: Diagnosis not present

## 2023-05-14 DIAGNOSIS — S39012D Strain of muscle, fascia and tendon of lower back, subsequent encounter: Secondary | ICD-10-CM | POA: Diagnosis not present

## 2023-05-14 DIAGNOSIS — E860 Dehydration: Secondary | ICD-10-CM | POA: Diagnosis not present

## 2023-05-15 ENCOUNTER — Other Ambulatory Visit: Payer: Self-pay | Admitting: Adult Health

## 2023-05-15 ENCOUNTER — Ambulatory Visit: Payer: Medicare Other | Admitting: Adult Health

## 2023-05-15 ENCOUNTER — Other Ambulatory Visit: Payer: Self-pay | Admitting: General Practice

## 2023-05-15 ENCOUNTER — Encounter: Payer: Self-pay | Admitting: Adult Health

## 2023-05-15 ENCOUNTER — Other Ambulatory Visit (HOSPITAL_BASED_OUTPATIENT_CLINIC_OR_DEPARTMENT_OTHER): Payer: Self-pay

## 2023-05-15 VITALS — BP 110/70 | HR 91 | Temp 97.7°F

## 2023-05-15 DIAGNOSIS — E038 Other specified hypothyroidism: Secondary | ICD-10-CM | POA: Diagnosis not present

## 2023-05-15 DIAGNOSIS — F419 Anxiety disorder, unspecified: Secondary | ICD-10-CM

## 2023-05-15 DIAGNOSIS — I350 Nonrheumatic aortic (valve) stenosis: Secondary | ICD-10-CM | POA: Diagnosis not present

## 2023-05-15 DIAGNOSIS — N179 Acute kidney failure, unspecified: Secondary | ICD-10-CM

## 2023-05-15 DIAGNOSIS — Z8673 Personal history of transient ischemic attack (TIA), and cerebral infarction without residual deficits: Secondary | ICD-10-CM

## 2023-05-15 DIAGNOSIS — F333 Major depressive disorder, recurrent, severe with psychotic symptoms: Secondary | ICD-10-CM

## 2023-05-15 DIAGNOSIS — G4733 Obstructive sleep apnea (adult) (pediatric): Secondary | ICD-10-CM

## 2023-05-15 DIAGNOSIS — E1169 Type 2 diabetes mellitus with other specified complication: Secondary | ICD-10-CM

## 2023-05-15 DIAGNOSIS — I5032 Chronic diastolic (congestive) heart failure: Secondary | ICD-10-CM

## 2023-05-15 DIAGNOSIS — I480 Paroxysmal atrial fibrillation: Secondary | ICD-10-CM | POA: Diagnosis not present

## 2023-05-15 DIAGNOSIS — R8290 Unspecified abnormal findings in urine: Secondary | ICD-10-CM

## 2023-05-15 DIAGNOSIS — K219 Gastro-esophageal reflux disease without esophagitis: Secondary | ICD-10-CM | POA: Diagnosis not present

## 2023-05-15 DIAGNOSIS — M1A09X0 Idiopathic chronic gout, multiple sites, without tophus (tophi): Secondary | ICD-10-CM | POA: Diagnosis not present

## 2023-05-15 DIAGNOSIS — R531 Weakness: Secondary | ICD-10-CM

## 2023-05-15 DIAGNOSIS — K5900 Constipation, unspecified: Secondary | ICD-10-CM

## 2023-05-15 DIAGNOSIS — Z794 Long term (current) use of insulin: Secondary | ICD-10-CM

## 2023-05-15 LAB — URINALYSIS, ROUTINE W REFLEX MICROSCOPIC
Bilirubin Urine: NEGATIVE
Ketones, ur: 15 — AB
Nitrite: NEGATIVE
Specific Gravity, Urine: 1.015 (ref 1.000–1.030)
Urine Glucose: >=1000 — AB
Urobilinogen, UA: 0.2 (ref 0.0–1.0)
pH: 6 (ref 5.0–8.0)

## 2023-05-15 MED ORDER — NITROGLYCERIN 0.4 MG SL SUBL
0.4000 mg | SUBLINGUAL_TABLET | SUBLINGUAL | 4 refills | Status: DC | PRN
  Filled 2023-05-15: qty 25, 8d supply, fill #0

## 2023-05-15 MED ORDER — CIPROFLOXACIN HCL 500 MG PO TABS
500.0000 mg | ORAL_TABLET | Freq: Two times a day (BID) | ORAL | 0 refills | Status: DC
  Filled 2023-05-15: qty 10, 5d supply, fill #0

## 2023-05-15 MED ORDER — BISACODYL 5 MG PO TBEC
DELAYED_RELEASE_TABLET | ORAL | 1 refills | Status: DC
  Filled 2023-05-15 (×2): qty 100, 30d supply, fill #0

## 2023-05-15 MED ORDER — HYDROXYZINE PAMOATE 25 MG PO CAPS
25.0000 mg | ORAL_CAPSULE | Freq: Three times a day (TID) | ORAL | 1 refills | Status: DC | PRN
  Filled 2023-05-15: qty 90, 30d supply, fill #0

## 2023-05-15 NOTE — Progress Notes (Deleted)
 Subjective:     Patient ID: Breanna Webster, female   DOB: Nov 11, 1945     MRN: [id number]    Brief patient profile:  28 yowf never smoker with pnds as child allergy tested as child doesn't know details, never took shots,  outgrew completely then symptoms resumed in 90's after moved to [place] from [place] with coughing that improved p NF late 90s  but still had drainage variably  severe no pattern as to time of year then much worse since summer 2016 with onset of cough/wheeze May 2017 referred to pulmonary clinic 10/14/2015 by Dr Betty [place].      History of Present Illness  10/14/2015 1st [facility] office visit/ Breanna Webster   Chief Complaint  Patient presents with   Pulmonary Consult    Referred by Dr. Betty Swaziland. Pt c/o cough and wheezing x 5 wks. Cough is occ prod with minimal green sputum.   acute onset cough > green mucus one week p husband's uri May 2017 >  rx with abx/ prednisone improved then worse 2 days p finished but mostly productive  white mucus.  If doesn't take the codeine cough med then coughs worse at hs.  No assoc sob/ some wheeze better with saba  rec The key to effective treatment for your cough is eliminating the non-stop cycle of cough you're stuck in long enough to let your airway heal completely and then see if there is anything still making you cough once you stop the cough suppression, but this should take no more than 5 days to figure out Eliminate all coughing for 3 days with your codiene syrup and then switch delsym 2 tsp every 12 hours  Prednisone 10 mg take  4 each am x 2 days,   2 each am x 2 days,  1 each am x 2 days and stop (this is to eliminate allergies and inflammation from coughing) Protonix (pantoprazole) Take 30-60 min before first meal of the day and Pepcid 20 mg one bedtime plus chlorpheniramine 4 mg x 2 at bedtime (both available over the counter)  until cough is completely gone for at least a week without the need for cough suppression GERD  diet  03/20/17 EGD [name]:  Loose NF, Barrett's    11/11/2017  Extended f/u ov/Aliena Ghrist re: doe/ recurrent cough on gerd rx  Chief Complaint  Patient presents with   Follow-up    Pt states she still has the cough. Pt states she will have spasms that will bring on her cough. Pt also has complaints of SOB with exertion and has chest tightness.  Dyspnea:  MMRC3 = can't walk 100 yards even at a slow pace at a flat grade s stopping due to sob  - only able to shop by pushing cart around "anywhere" but with freq stops  Cough: dry sporadic daytime / eating and talking seem to trigger it mostly  rec  To get the most out of exercise, you need to be continuously aware that you are short of breath, but never out of breath, for 30 minutes daily. As you improve, it will actually be easier for you to do the same amount of exercise  in  30 minutes so always push to the level where you are short of breath.    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 The left ventricle has normal systolic function, with an ejection fraction of 60-65%. The cavity size was normal. There is moderately increased left ventricular wall thickness. Left ventricular diastolic Doppler parameters are consistent with  impaired relaxation. Elevated left ventricular end-diastolic pressure. Nl LA GI diastolic dysfunction   2. The right ventricle has normal systolc function. The cavity was normal. There is no increase in right ventricular wall thickness. Right ventricular systolic pressure could not be assessed.  3. The mitral valve is degenerative. Mild thickening of the mitral valve leaflet. There is severe mitral annular calcification present. Mild mitral valve stenosis.  4. Pulmonic  valve regurgitation was not assessed by color flow Doppler.  5. - TAVR: S/P TAVR that appears to be functioning normally. There is no perivavluarl AR. The mean AVG is normal at . LVOT/AV VTI ratio:0.54. AV Vmax:279.00 cm/s.      [facility] eval 05/22/19  : her dyspnea is due to severe impairment of diastolic inflow into the left heart in the setting of severe LVH and calcific mitral valve disease. Her cirrhosis and anemia causing high output are also contributing to her symptoms of shortness of breath. Optimizing her medical regimen would be beneficial   Rx by NP 03/13/21 Seen today for acute bronchitis symptoms. CXR showed mild bilateral interstitial thickening which can be seen with this. Sending in RX for antibiotics and low dose prednisone d.t productive/purulent cough. We will also check labs d/t vascular congestion seen on CXR which can be from heart failure. You will be getting echocardiogram >>>  ok 03/21/21 x LAE mod   Recommendations: - Doxycycline 1 tablet twice daily x 7 days - Prednisone 10mg  x 5 days; then 5mg  x 5 days; stop  - Start Mucinex 600mg  twice a day for 7-10 days or until cough improved (over the counter) - Start Delsym cough syrup twice a day for 7-10 days or until cough improved (over the counter) >>>  gradually better  04/05/21 to ER p fell > 2 days after started hurting midline and to R worse with coughing    04/27/2021  acute ov/Breanna Webster re: cough/sob p fell  maint on ppi bid ac  Chief Complaint  Patient presents with   Acute Visit    Pt c/o pain with deep breath after recent fall. She also c/o cough with green sputum.   Dyspnea:  slowed down by knees > sob  Cough: mornings green mucus  Sleeping: head of bed up 5 inches / no longer using cpap  SABA use: none  02: none  Covid status:   vax x 4  Cp gradually improving since fell, still notes with really deep breath on R ant cw Rec Take both prilosec (omeprazole)  20 mg (total of 40 mg) Take 30-60 min before  first meal of the day and pepcid(famotidine) 20 mg one after supper  For cough mucinex dm 1200 mg every 12 hours as needed GERD diet reviewed, bed blocks rec  Augmentin 875 mg take one pill twice daily  X 10 days -  Return to see me 4 weeks if not back to your normal self   06/27/2021  f/u ov/Breanna Webster re: uacs maint on prilosec 20 mg x one  (not following instructions)  No chief complaint on file.  Dyspnea:  more knees than breathing limiting  Cough: pnds with  mucus turned cleared p augmentin / pnds/nasal discharge  Sleeping: bed blocks breathing and cough are better when sleeping but wakes with lots of congestion  SABA use: none  02: none  Covid status:   vax x 4  Rec Take both prilosec (omeprazole)  20 mg (total of 40 mg) Take 30-60 min before first meal of the day and pepcid(famotidine) 20 mg one after supper  For drainage / throat tickle try take CHLORPHENIRAMINE  4 mg For cough >  mucinex dm 1200 mg every 12 hours as needed and supplement with needed tylenol #3 up to one every 4 hours as needed   Follow up is as needed with ENT evaluation next step    05/17/2023  f/u ov/Breanna Webster re: ***   maint on ***  No chief complaint on file.   Dyspnea:  *** Cough: *** Sleeping: *** resp cc  SABA use: *** 02: ***  Lung cancer screening :  ***    No obvious day to day or daytime variability or assoc excess/ purulent sputum or mucus plugs or hemoptysis or cp or chest tightness, subjective wheeze or overt sinus or hb symptoms.    Also denies any obvious fluctuation of symptoms with weather or environmental changes or other aggravating or alleviating factors except as outlined above   No unusual exposure hx or h/o childhood pna/ asthma or knowledge of premature birth.  Current Allergies, Complete Past Medical History, Past Surgical History, Family History, and Social History were reviewed in [employer] record.  ROS  The following are not active complaints unless  bolded Hoarseness, sore throat, dysphagia, dental problems, itching, sneezing,  nasal congestion or discharge of excess mucus or purulent secretions, ear ache,   fever, chills, sweats, unintended wt loss or wt gain, classically pleuritic or exertional cp,  orthopnea pnd or arm/hand swelling  or leg swelling, presyncope, palpitations, abdominal pain, anorexia, nausea, vomiting, diarrhea  or change in bowel habits or change in bladder habits, change in stools or change in urine, dysuria, hematuria,  rash, arthralgias, visual complaints, headache, numbness, weakness or ataxia or problems with walking or coordination,  change in mood or  memory.        No outpatient medications have been marked as taking for the 05/17/23 encounter (Appointment) with Nyoka Cowden, MD.                           Objective:   Physical Exam   Wts  05/17/2023        ***  06/27/2021          178  04/27/2021          176  05/26/2019          192  02/11/2019      192  04/08/2018      204  03/13/2018      202  11/11/2017        204  07/19/2016        196 per pt  12/13/2015       193   11/02/2015       193  10/14/15 192 lb (87.091 kg)  10/10/15 190 lb (86.183 kg)  10/07/15 189 lb 4 oz (85.843 kg)   Vital signs reviewed  05/17/2023  - Note at rest 02 sats  ***% on ***   General appearance:    ***  Assessment:

## 2023-05-15 NOTE — Progress Notes (Unsigned)
Subjective:    Patient ID: Breanna Webster, female    DOB: [birth year], 78 y.o.   MRN: [id number]  HPI 78 year old female who  has a past medical history of Anxiety, Arthritis, Atrial fibrillation with RVR (HCC) (10/21/2018), Back pain, Bacteremia due to Streptococcus Salivarius (10/07/2018), Blood transfusion without reported diagnosis, CAD (coronary artery disease), Carotid artery stenosis, Cerebellar stroke, acute (HCC) (10/21/2018), Cerebrovascular accident (CVA) due to embolism of right posterior cerebral artery (HCC) (08/03/2020), Chest pain, CHF (congestive heart failure) (HCC), Chronic lower back pain, Cirrhosis (HCC), Colon polyps, Diverticulitis, Diverticulosis, Esophageal thickening, GERD (gastroesophageal reflux disease), Gout, Grave's disease, H/O total shoulder replacement, left (06/01/2022), Heart murmur, History of cardioembolic stroke (40/01/2724), History of colonic polyps (05/22/2017), History of hiatal hernia, Hyperlipidemia, Hypertension, Hypothyroidism, IBS (irritable bowel syndrome), Osteopenia, Parkinson's syndrome (HCC) (04/23/2020), Pulmonary nodules, S/P TAVR (transcatheter aortic valve replacement), Shortness of breath on exertion, Stroke (HCC) (2021), Thalassemia minor, Thyroid disease, and Type II diabetes mellitus (HCC).  Admit Date 05/01/2023 Discharge Date 05/07/2023  She was brought in by EMS for fall at home.  Per patient she had been more anxious recently and had a "panic attack, her husband reported that the patient was pounding on a window.  Patient's husband was trying to calm the patient down and she turned and fell onto the floor.  There was no loss of consciousness and she did not hit her head but did complain of a headache which prompted him to bring the patient to the hospital.  In the ED psych was consulted and at that time patient did calm down a bit and medications were adjusted.  She had generalized weakness and was admitted for observation  Hospital  Course  Fall/Generalized Weakness -No acute cause, thought to be possibly dehydration but likely chronic debility.  Her head CT was unremarkable.  CT lumbar and cervical spine with no acute abnormality.  CT chest abdomen pelvis with no pneumothorax.  No pulmonary contusion.  No acute infiltrates or pleural effusion.  No acute fracture or subluxation. -UA was negative -PT/OT recommended daily therapy -Consulted for placement.  Patient refusing to go to skilled nursing facility, husband is wanting her to go as he does not think he can take care of her at home.  AKI -310 1.19 on admission down to 0.68 upon discharge -He was gently hydrated with normal saline -Avoid nephrotoxic agents as possible  Hypercalcemia -Ca 11.7 on admission down to 10.6 upon discharge -Hydrated with IV fluids  Diastolic CHF -Echo in March 2024 showed LV SF normal, EF 60 to 65%, no regional wall motion abnormality, diastolic dysfunction indeterminate. -Torsemide was held given AKI and poor appetite  PAF -Rate controlled.  Continued on diltiazem -Continued on Eliquis 5 mg twice daily  Severe AAS - S/p TAVR  Type 2 diabetes mellitus - Continued on Jardiance and metformin 500 mg ER daily -Adding scale insulin 1 to 6 units given during admission   Hypothyroidism  -Continued on Synthroid  Gout - Continued on Allopurinol   GERD - Continued on PPI   Hx of strokes - Resumed home medications   OSA - Continued with Bipap  MDD/Anxiety  -Psych was consulted.  She was continued on Xanax 0.5 mg 3 times daily as needed.  Zyprexa 2.5 mg twice daily was discontinued due to prolonged QTc.  Psych reported that she continued to have prolongation of QTc and she did not want to be on any standing psychiatric medications so Zoloft was discontinued and added as  needed Vistaril for anxiety.  She reported depressive/anxiety symptoms were contextual to being hospitalized and upset and annoyed with her husband. -He was given  melatonin 3 mg at bedtime  Prolonged QTC - Avoid meds that will prolong QTC further  Constipation  -She reported no bowel movement for several days.  Her x-ray showed colonic gas and stool with no obstruction.  She was given MiraLAX twice daily x 2 days and lactulose x 1 advised as needed bowel regimen  Today she presents to the office with her husband.  Her husband reports that EMS and 2 police officers had to come out to the house last night due to [place] having a severe panic attack.  She was able to be calm down eventually.  Her husband reports that he did not get a prescription for Vistaril after she was discharged he has been given her Xanax as needed for anxiety and panic attacks Xanax does not seem to be helping much.  She continues to complain of a lot of pain in her back and shoulders as well as her legs.  She does not like taking oxycodone due to the way it makes her feel and it also causes her to have constipation.  She no longer wants to take MiraLAX because she does not like the taste of it.  Husband reports that he did look at placement into assisted living facilities but that "they are all booked up".  He is currently getting and getting some home health for her.  Physical therapy and home health RN came by yesterday and will start there evaluations later on this week.  In the office the patient is anxious and agitated/annoyed with her husband.  She feels as though he no longer loves her because she cannot do what she was able to do in the past.  He is having to provide care around-the-clock for her.  She refuses to go into any type of living facility.   Her husband also reports that the patient has had cloudy urine for the last 2-3 days. Denies other symptoms of UTI   Review of Systems  Constitutional:  Positive for appetite change.  HENT: Negative.    Respiratory: Negative.    Cardiovascular: Negative.   Gastrointestinal:  Positive for constipation.  Endocrine: Negative.    Genitourinary:        Cloudy urine  Musculoskeletal:  Positive for arthralgias and back pain.  Skin: Negative.   Allergic/Immunologic: Negative.   Neurological: Negative.   Hematological: Negative.   Psychiatric/Behavioral:  Positive for agitation, behavioral problems and sleep disturbance. Negative for self-injury and suicidal ideas. The patient is nervous/anxious.    Past Medical History:  Diagnosis Date   Anxiety    Arthritis    "back" (04/22/2018)   Atrial fibrillation with RVR (HCC) 10/21/2018   Back pain    Bacteremia due to Streptococcus Salivarius 10/07/2018   Blood transfusion without reported diagnosis    CAD (coronary artery disease)    a. 03/2018 s/p PCI/DES to the RCA (3.0x15 Onyx DES).   Carotid artery stenosis    Mild   Cerebellar stroke, acute (HCC) 10/21/2018   Cerebrovascular accident (CVA) due to embolism of right posterior cerebral artery (HCC) 08/03/2020   Chest pain    CHF (congestive heart failure) (HCC)    Chronic lower back pain    Cirrhosis (HCC)    Colon polyps    Diverticulitis    Diverticulosis    Esophageal thickening    seen on pre TAVR  CT scan, also questionable cirrhosis. MRI recommended. Will refer to GI   GERD (gastroesophageal reflux disease)    Gout    Grave's disease    H/O total shoulder replacement, left 06/01/2022   Heart murmur    History of cardioembolic stroke 03/29/2020   History of colonic polyps 05/22/2017   History of hiatal hernia    Hyperlipidemia    Hypertension    Hypothyroidism    IBS (irritable bowel syndrome)    Osteopenia    Parkinson's syndrome (HCC) 04/23/2020   Pulmonary nodules    seen on pre TAVR CT. likley benign. no follow up recommended if pt low risk.   S/P TAVR (transcatheter aortic valve replacement)    Shortness of breath on exertion    Stroke (HCC) 2021   x2   Thalassemia minor    Thyroid disease    Type II diabetes mellitus (HCC)     Social History   Socioeconomic History   Marital  status: Married    Spouse name: Not on file   Number of children: 2   Years of education: Not on file   Highest education level: Associate degree: academic program  Occupational History   Occupation: Health and safety inspector of the [employer]  Tobacco Use   Smoking status: Never    Passive exposure: Never   Smokeless tobacco: Never  Vaping Use   Vaping status: Never Used  Substance and Sexual Activity   Alcohol use: No    Alcohol/week: 0.0 standard drinks of alcohol   Drug use: Never   Sexual activity: Not Currently  Other Topics Concern   Not on file  Social History Narrative   Lives with husband in a one story home.     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   Education: college.   Social Drivers of Health   Financial Resource Strain: Patient Declined (12/09/2022)   Overall Financial Resource Strain (CARDIA)    Difficulty of Paying Living Expenses: Patient declined  Food Insecurity: No Food Insecurity (05/02/2023)   Received from [facility]   Hunger Vital Sign    Worried About Running Out of Food in the Last Year: Never true    Ran Out of Food in the Last Year: Never true  Transportation Needs: No Transportation Needs (05/02/2023)   Received from  [facility] - Transportation    Lack of Transportation (Medical): No    Lack of Transportation (Non-Medical): No  Physical Activity: Unknown (12/09/2022)   Exercise Vital Sign    Days of Exercise per Week: Patient declined    Minutes of Exercise per Session: 40 min  Stress: No Stress Concern Present (05/02/2023)   Received from [facility] - Occupational Stress Questionnaire    Feeling of Stress : Not at all  Social Connections: Unknown (12/09/2022)   Social Connection and Isolation Panel [NHANES]    Frequency of Communication with Friends and Family: More than three times a week    Frequency of  Social Gatherings with Friends and Family: Patient declined    Attends Religious Services: Patient declined    Database administrator or Organizations: Patient declined    Attends Engineer, structural: More than 4 times per year    Marital Status: Married  Catering manager Violence: Not At Risk (05/02/2023)   Received from [facility]   HITS    Over the last  12 months how often did your partner physically hurt you?: Never    Over the last 12 months how often did your partner insult you or talk down to you?: Never    Over the last 12 months how often did your partner threaten you with physical harm?: Never    Over the last 12 months how often did your partner scream or curse at you?: Never    Past Surgical History:  Procedure Laterality Date   33 HOUR PH STUDY N/A 03/03/2018   Procedure: 24 HOUR PH STUDY;  Surgeon: Napoleon Form, MD;  Location: [facility] ENDOSCOPY;  Service: Endoscopy;  Laterality: N/A;   COLONOSCOPY  2023   COLONOSCOPY W/ BIOPSIES AND POLYPECTOMY     CORONARY ANGIOGRAPHY Right 04/21/2018   Procedure: CORONARY ANGIOGRAPHY (CATH LAB);  Surgeon: Lyn Records, MD;  Location: [facility] INVASIVE CV LAB;  Service: Cardiovascular;  Laterality: Right;   CORONARY STENT INTERVENTION N/A 04/22/2018   Procedure: CORONARY STENT INTERVENTION;  Surgeon: Lyn Records, MD;  Location: [facility] INVASIVE CV LAB;  Service: Cardiovascular;  Laterality: N/A;   DILATION AND CURETTAGE OF UTERUS     ESOPHAGEAL MANOMETRY N/A 03/03/2018   Procedure: ESOPHAGEAL MANOMETRY (EM);  Surgeon: Napoleon Form, MD;  Location: [facility] ENDOSCOPY;  Service: Endoscopy;  Laterality: N/A;   HYSTEROSCOPY     fibroids   LAPAROSCOPIC CHOLECYSTECTOMY  1985   LAPAROSCOPY     fibroids   NISSEN FUNDOPLICATION  1990s   POLYPECTOMY     REVERSE SHOULDER ARTHROPLASTY Left 06/01/2022   Procedure: REVERSE SHOULDER ARTHROPLASTY;  Surgeon: Beverely Low, MD;  Location: [facility] ORS;  Service: Orthopedics;  Laterality: Left;  choice  with interscalene block   RIGHT/LEFT HEART CATH AND CORONARY ANGIOGRAPHY N/A 02/20/2018   Procedure: RIGHT/LEFT HEART CATH AND CORONARY ANGIOGRAPHY;  Surgeon: Lyn Records, MD;  Location: [facility] INVASIVE CV LAB;  Service: Cardiovascular;  Laterality: N/A;   TEE WITHOUT CARDIOVERSION N/A 07/08/2018   Procedure: TRANSESOPHAGEAL ECHOCARDIOGRAM (TEE);  Surgeon: Kathleene Hazel, MD;  Location: [facility] OR;  Service: Open Heart Surgery;  Laterality: N/A;   TEE WITHOUT CARDIOVERSION  10/07/2018   TEE WITHOUT CARDIOVERSION N/A 10/07/2018   Procedure: TRANSESOPHAGEAL ECHOCARDIOGRAM (TEE);  Surgeon: Jake Bathe, MD;  Location: [facility] ENDOSCOPY;  Service: Cardiovascular;  Laterality: N/A;   TONSILLECTOMY     age 43   TRANSCATHETER AORTIC VALVE REPLACEMENT, TRANSFEMORAL N/A 07/08/2018   Procedure: TRANSCATHETER AORTIC VALVE REPLACEMENT, TRANSFEMORAL;  Surgeon: Kathleene Hazel, MD;  Location: [facility] OR;  Service: Open Heart Surgery;  Laterality: N/A;    Family History  Adopted: Yes  Problem Relation Age of Onset   Healthy Son        x 2   Headache Other        Cluster headaches   Heart failure Mother    Colon cancer Neg Hx    Pancreatic cancer Neg Hx    Rectal cancer Neg Hx    Stomach cancer Neg Hx     Allergies  Allergen Reactions   Statins Other (See Comments)    Tried simvastatin, atorvastatin; on Repatha    Current Outpatient Medications on File Prior to Visit  Medication Sig Dispense Refill   acetaminophen (TYLENOL) 500 MG tablet Take 1 tablet (500 mg total) by mouth 4 (four) times daily. 30 tablet 0   allopurinol (ZYLOPRIM) 100 MG tablet Take 1 tablet (100 mg total) by mouth daily. 90 tablet 1   amitriptyline (ELAVIL) 25 MG tablet Take 1  tablet (25 mg total) by mouth at bedtime. 90 tablet 1   apixaban (ELIQUIS) 5 MG TABS tablet Take 1 tablet (5 mg total) by mouth 2 (two) times daily. 60 tablet 3   b complex vitamins capsule Take 1 capsule by mouth daily.     Cholecalciferol (D3  PO) Take 1 tablet by mouth daily.     diltiazem (CARTIA XT) 120 MG 24 hr capsule Take 1 capsule (120 mg total) by mouth daily. 90 capsule 3   empagliflozin (JARDIANCE) 10 MG TABS tablet Take 1 tablet (10 mg total) by mouth daily. 30 tablet 0   Insulin NPH, Human,, Isophane, (HUMULIN N KWIKPEN) 100 UNIT/ML Kiwkpen Inject 20 units into the skin every morning and 14 units every evening. 15 mL 6   insulin regular (HUMULIN R) 100 units/mL injection Inject 0.1 mLs (10 Units total) into the skin 3 (three) times daily before meals (Patient taking differently: Inject 10-12 Units into the skin 3 (three) times daily. Sliding scale based on testing number) 10 mL 3   metFORMIN (GLUCOPHAGE-XR) 500 MG 24 hr tablet Take 1 tablet by mouth 2 times daily 180 tablet 2   Multiple Vitamin (MULITIVITAMIN WITH MINERALS) TABS Take 1 tablet by mouth daily.     nitroGLYCERIN (NITROSTAT) 0.4 MG SL tablet Place 1 tablet (0.4 mg total) under the tongue every 5 (five) minutes as needed for chest pain. 25 tablet 4   omeprazole (PRILOSEC) 20 MG capsule Take 1 capsule (20 mg total) by mouth daily. 90 capsule 3   ondansetron (ZOFRAN) 4 MG tablet Take 1 tablet (4 mg total) by mouth every 8 (eight) hours as needed for nausea or vomiting. 40 tablet 1   polyethylene glycol (MIRALAX) 17 g packet Take 17 g by mouth daily as needed.     potassium chloride SA (KLOR-CON M) 20 MEQ tablet Take 0.5 tablets (10 mEq total) by mouth daily. Take in concurrence with torsemide.     SYNTHROID 137 MCG tablet Take 1 tablet (137 mcg total) by mouth every morning on empty stomach. 90 tablet 1   tiZANidine (ZANAFLEX) 4 MG tablet Take 1 tablet (4 mg total) by mouth 3 (three) times daily. (Patient taking differently: Take 4 mg by mouth daily as needed for muscle spasms.) 90 tablet 1   torsemide (DEMADEX) 20 MG tablet Take 2 tablets (40 mg total) by mouth daily. 60 tablet 0   Vibegron (GEMTESA) 75 MG TABS Take 75 mg by mouth at bedtime.     vitamin E 180 MG  (400 UNITS) capsule Take 400 Units by mouth daily.     colchicine 0.6 MG tablet Take 1 tablet (0.6 mg total) by mouth daily. as needed for gout (Patient not taking: Reported on 05/15/2023) 90 tablet 0   spironolactone (ALDACTONE) 25 MG tablet Take 0.5 tablets (12.5 mg total) by mouth daily. 15 tablet 0   No current facility-administered medications on file prior to visit.    BP 110/70   Pulse 91   Temp 97.7 F (36.5 C) (Oral)   SpO2 96%       Objective:   Physical Exam Vitals and nursing note reviewed.  Cardiovascular:     Rate and Rhythm: Normal rate and regular rhythm.     Pulses: Normal pulses.     Heart sounds: Normal heart sounds.  Pulmonary:     Effort: Pulmonary effort is normal.     Breath sounds: Normal breath sounds.  Abdominal:     General: Abdomen is flat.  Bowel sounds are normal.     Palpations: Abdomen is soft.  Musculoskeletal:        General: Normal range of motion.  Skin:    General: Skin is warm and dry.     Capillary Refill: Capillary refill takes less than 2 seconds.  Neurological:     General: No focal deficit present.     Mental Status: She is oriented to person, place, and time.     Motor: Weakness present.     Gait: Gait abnormal (in wheelchair for exam).  Psychiatric:        Mood and Affect: Mood is anxious and depressed. Affect is angry and tearful.        Speech: Speech normal.        Behavior: Behavior is agitated.        Thought Content: Thought content is paranoid.        Cognition and Memory: Cognition and memory normal.        Judgment: Judgment normal.       Assessment & Plan:  1. Generalized weakness -Reviewed hospital notes, discharge instructions, labs, imaging, and medication changes with the patient and her husband.  All questions answered to the best of my ability. -He was encouraged to work with physical therapy.  She is difficult and complex and I am not 100% sure how much longer her husband can continue to care for her at  home.  Likely she would be better off in a long living facility but she refuses to go. - CBC; Future - Basic Metabolic Panel; Future  2. AKI (acute kidney injury) (HCC) - Stay hydrated  - CBC; Future - Basic Metabolic Panel; Future  3. Hypercalcemia  - CBC; Future - Basic Metabolic Panel; Future  4. Chronic diastolic congestive heart failure (HCC) - Continue with home medications  - CBC; Future - Basic Metabolic Panel; Future  5. PAF (paroxysmal atrial fibrillation) (HCC) - Continue with home medications  - CBC; Future - Basic Metabolic Panel; Future  6. Severe aortic stenosis  - CBC; Future - Basic Metabolic Panel; Future  7. Type 2 diabetes mellitus with other specified complication, with long-term current use of insulin (HCC) - Continue with Jardiance, Metformin and insulin. Follow up with endocrinology as directed - CBC; Future - Basic Metabolic Panel; Future  8. Other specified hypothyroidism - Continue with synthroid  - CBC; Future - Basic Metabolic Panel; Future  9. Chronic gout of multiple sites, unspecified cause - Continue with allopurinol  - CBC; Future - Basic Metabolic Panel; Future  10. Gastroesophageal reflux disease without esophagitis - Continue with PPI  - CBC; Future - Basic Metabolic Panel; Future  11. History of CVA (cerebrovascular accident) - Continue home medications  - CBC; Future - Basic Metabolic Panel; Future  12. OSA treated with BiPAP - Continue with Bipap - CBC; Future - Basic Metabolic Panel; Future  13. Severe episode of recurrent major depressive disorder, with psychotic features (HCC)  - CBC; Future - Basic Metabolic Panel; Future  14. Anxiety - Will send in Visteral 25 mg TID PRN. This is her biggest issue currently. Hopefully this will help her anxiety and let her get some rest - Follow up in 2 weeks if not effective  - hydrOXYzine (VISTARIL) 25 MG capsule; Take 1 capsule (25 mg total) by mouth every 8 (eight)  hours as needed.  Dispense: 90 capsule; Refill: 1 - CBC; Future - Basic Metabolic Panel; Future  15. Constipation, unspecified constipation type - Can  take Dulcolax when she takes her narcotic pain medication  - bisacodyl (DULCOLAX) 5 MG EC tablet; Take 1 Tablet with each dose pf pain medicine.  Dispense: 100 tablet; Refill: 1 - CBC; Future - Basic Metabolic Panel; Future  16. Cloudy urine (Primary)  - POC Urinalysis Dipstick - Urinalysis; Future - Urine Culture; Future   Shirline Frees, NP

## 2023-05-16 ENCOUNTER — Other Ambulatory Visit: Payer: Self-pay | Admitting: Adult Health

## 2023-05-16 ENCOUNTER — Other Ambulatory Visit (HOSPITAL_BASED_OUTPATIENT_CLINIC_OR_DEPARTMENT_OTHER): Payer: Self-pay

## 2023-05-16 ENCOUNTER — Telehealth: Payer: Self-pay | Admitting: Neurology

## 2023-05-16 MED ORDER — SPIRONOLACTONE 25 MG PO TABS
12.5000 mg | ORAL_TABLET | Freq: Every day | ORAL | 0 refills | Status: DC
  Filled 2023-05-16: qty 15, 30d supply, fill #0

## 2023-05-16 NOTE — Telephone Encounter (Signed)
Pt's spouse is asking for a call from either Dr Vickey Huger or Shanda Bumps, NP to discuss the recent increase and severity of the panic attacks pt is having.  Spouse reports he has recently had to call 911 twice because of the sever panic attacks that cause pt to become very argumentative. Spouse is in his 59's and at his wits end in regards to what to do, he is asking for a call with any recommendations Dr Vickey Huger or Shanda Bumps, NP has.

## 2023-05-16 NOTE — Telephone Encounter (Signed)
Spouse was called, message from RN was relayed.  His response was that we do what have to do, and he will do what he has to do. The call ended from there.

## 2023-05-17 ENCOUNTER — Ambulatory Visit: Payer: Medicare Other | Admitting: Internal Medicine

## 2023-05-17 ENCOUNTER — Other Ambulatory Visit (HOSPITAL_BASED_OUTPATIENT_CLINIC_OR_DEPARTMENT_OTHER): Payer: Self-pay

## 2023-05-17 DIAGNOSIS — I509 Heart failure, unspecified: Secondary | ICD-10-CM | POA: Diagnosis not present

## 2023-05-17 DIAGNOSIS — S39012D Strain of muscle, fascia and tendon of lower back, subsequent encounter: Secondary | ICD-10-CM | POA: Diagnosis not present

## 2023-05-17 DIAGNOSIS — N179 Acute kidney failure, unspecified: Secondary | ICD-10-CM | POA: Diagnosis not present

## 2023-05-17 DIAGNOSIS — R350 Frequency of micturition: Secondary | ICD-10-CM | POA: Diagnosis not present

## 2023-05-17 DIAGNOSIS — E860 Dehydration: Secondary | ICD-10-CM | POA: Diagnosis not present

## 2023-05-17 DIAGNOSIS — R3915 Urgency of urination: Secondary | ICD-10-CM | POA: Diagnosis not present

## 2023-05-17 DIAGNOSIS — I110 Hypertensive heart disease with heart failure: Secondary | ICD-10-CM | POA: Diagnosis not present

## 2023-05-17 LAB — URINE CULTURE
MICRO NUMBER:: 15984134
SPECIMEN QUALITY:: ADEQUATE

## 2023-05-17 MED ORDER — AMOXICILLIN-POT CLAVULANATE 875-125 MG PO TABS
1.0000 | ORAL_TABLET | Freq: Two times a day (BID) | ORAL | 0 refills | Status: DC
  Filled 2023-05-17: qty 20, 10d supply, fill #0

## 2023-05-17 NOTE — Addendum Note (Signed)
Addended by: Waymon Amato R on: 05/17/2023 02:50 PM   Modules accepted: Orders

## 2023-05-18 ENCOUNTER — Inpatient Hospital Stay (HOSPITAL_COMMUNITY)
Admission: EM | Admit: 2023-05-18 | Discharge: 2023-05-21 | DRG: 872 | Disposition: A | Discharge status: Home / Self Care | Payer: Medicare Other | Attending: Internal Medicine | Admitting: Internal Medicine

## 2023-05-18 ENCOUNTER — Emergency Department (HOSPITAL_COMMUNITY): Payer: Medicare Other

## 2023-05-18 ENCOUNTER — Inpatient Hospital Stay (HOSPITAL_COMMUNITY): Payer: Medicare Other

## 2023-05-18 ENCOUNTER — Other Ambulatory Visit: Payer: Self-pay

## 2023-05-18 DIAGNOSIS — E876 Hypokalemia: Secondary | ICD-10-CM | POA: Diagnosis not present

## 2023-05-18 DIAGNOSIS — I6529 Occlusion and stenosis of unspecified carotid artery: Secondary | ICD-10-CM | POA: Diagnosis not present

## 2023-05-18 DIAGNOSIS — R29810 Facial weakness: Secondary | ICD-10-CM | POA: Diagnosis not present

## 2023-05-18 DIAGNOSIS — G20A1 Parkinson's disease without dyskinesia, without mention of fluctuations: Secondary | ICD-10-CM | POA: Diagnosis not present

## 2023-05-18 DIAGNOSIS — A4151 Sepsis due to Escherichia coli [E. coli]: Secondary | ICD-10-CM | POA: Diagnosis not present

## 2023-05-18 DIAGNOSIS — I110 Hypertensive heart disease with heart failure: Secondary | ICD-10-CM | POA: Diagnosis not present

## 2023-05-18 DIAGNOSIS — Z7984 Long term (current) use of oral hypoglycemic drugs: Secondary | ICD-10-CM

## 2023-05-18 DIAGNOSIS — R531 Weakness: Secondary | ICD-10-CM | POA: Diagnosis not present

## 2023-05-18 DIAGNOSIS — E785 Hyperlipidemia, unspecified: Secondary | ICD-10-CM | POA: Diagnosis not present

## 2023-05-18 DIAGNOSIS — D259 Leiomyoma of uterus, unspecified: Secondary | ICD-10-CM | POA: Diagnosis not present

## 2023-05-18 DIAGNOSIS — M545 Low back pain, unspecified: Secondary | ICD-10-CM | POA: Diagnosis present

## 2023-05-18 DIAGNOSIS — D509 Iron deficiency anemia, unspecified: Secondary | ICD-10-CM | POA: Diagnosis not present

## 2023-05-18 DIAGNOSIS — I5032 Chronic diastolic (congestive) heart failure: Secondary | ICD-10-CM | POA: Diagnosis present

## 2023-05-18 DIAGNOSIS — F05 Delirium due to known physiological condition: Secondary | ICD-10-CM | POA: Diagnosis not present

## 2023-05-18 DIAGNOSIS — E871 Hypo-osmolality and hyponatremia: Secondary | ICD-10-CM | POA: Diagnosis not present

## 2023-05-18 DIAGNOSIS — K5900 Constipation, unspecified: Secondary | ICD-10-CM | POA: Diagnosis present

## 2023-05-18 DIAGNOSIS — N390 Urinary tract infection, site not specified: Secondary | ICD-10-CM | POA: Diagnosis present

## 2023-05-18 DIAGNOSIS — Z8249 Family history of ischemic heart disease and other diseases of the circulatory system: Secondary | ICD-10-CM

## 2023-05-18 DIAGNOSIS — Z7901 Long term (current) use of anticoagulants: Secondary | ICD-10-CM | POA: Diagnosis not present

## 2023-05-18 DIAGNOSIS — N179 Acute kidney failure, unspecified: Secondary | ICD-10-CM | POA: Diagnosis not present

## 2023-05-18 DIAGNOSIS — Z66 Do not resuscitate: Secondary | ICD-10-CM | POA: Diagnosis not present

## 2023-05-18 DIAGNOSIS — Z955 Presence of coronary angioplasty implant and graft: Secondary | ICD-10-CM

## 2023-05-18 DIAGNOSIS — E1169 Type 2 diabetes mellitus with other specified complication: Secondary | ICD-10-CM

## 2023-05-18 DIAGNOSIS — E872 Acidosis, unspecified: Secondary | ICD-10-CM | POA: Diagnosis present

## 2023-05-18 DIAGNOSIS — K5730 Diverticulosis of large intestine without perforation or abscess without bleeding: Secondary | ICD-10-CM | POA: Diagnosis not present

## 2023-05-18 DIAGNOSIS — I10 Essential (primary) hypertension: Secondary | ICD-10-CM | POA: Diagnosis not present

## 2023-05-18 DIAGNOSIS — M7989 Other specified soft tissue disorders: Secondary | ICD-10-CM

## 2023-05-18 DIAGNOSIS — N12 Tubulo-interstitial nephritis, not specified as acute or chronic: Principal | ICD-10-CM | POA: Diagnosis present

## 2023-05-18 DIAGNOSIS — Z888 Allergy status to other drugs, medicaments and biological substances status: Secondary | ICD-10-CM

## 2023-05-18 DIAGNOSIS — I447 Left bundle-branch block, unspecified: Secondary | ICD-10-CM | POA: Diagnosis present

## 2023-05-18 DIAGNOSIS — F419 Anxiety disorder, unspecified: Secondary | ICD-10-CM | POA: Diagnosis present

## 2023-05-18 DIAGNOSIS — Z952 Presence of prosthetic heart valve: Secondary | ICD-10-CM

## 2023-05-18 DIAGNOSIS — R1084 Generalized abdominal pain: Secondary | ICD-10-CM | POA: Diagnosis not present

## 2023-05-18 DIAGNOSIS — R339 Retention of urine, unspecified: Secondary | ICD-10-CM

## 2023-05-18 DIAGNOSIS — I5042 Chronic combined systolic (congestive) and diastolic (congestive) heart failure: Secondary | ICD-10-CM | POA: Insufficient documentation

## 2023-05-18 DIAGNOSIS — E860 Dehydration: Secondary | ICD-10-CM | POA: Diagnosis present

## 2023-05-18 DIAGNOSIS — R112 Nausea with vomiting, unspecified: Secondary | ICD-10-CM | POA: Diagnosis not present

## 2023-05-18 DIAGNOSIS — A419 Sepsis, unspecified organism: Secondary | ICD-10-CM | POA: Diagnosis present

## 2023-05-18 DIAGNOSIS — I5033 Acute on chronic diastolic (congestive) heart failure: Secondary | ICD-10-CM

## 2023-05-18 DIAGNOSIS — R079 Chest pain, unspecified: Secondary | ICD-10-CM | POA: Diagnosis not present

## 2023-05-18 DIAGNOSIS — I480 Paroxysmal atrial fibrillation: Secondary | ICD-10-CM | POA: Diagnosis not present

## 2023-05-18 DIAGNOSIS — G4489 Other headache syndrome: Secondary | ICD-10-CM | POA: Diagnosis not present

## 2023-05-18 DIAGNOSIS — Z794 Long term (current) use of insulin: Secondary | ICD-10-CM

## 2023-05-18 DIAGNOSIS — I959 Hypotension, unspecified: Secondary | ICD-10-CM | POA: Diagnosis not present

## 2023-05-18 DIAGNOSIS — F32A Depression, unspecified: Secondary | ICD-10-CM | POA: Diagnosis not present

## 2023-05-18 DIAGNOSIS — Z87440 Personal history of urinary (tract) infections: Secondary | ICD-10-CM

## 2023-05-18 DIAGNOSIS — R000 Tachycardia, unspecified: Secondary | ICD-10-CM | POA: Diagnosis present

## 2023-05-18 DIAGNOSIS — F064 Anxiety disorder due to known physiological condition: Secondary | ICD-10-CM | POA: Diagnosis present

## 2023-05-18 DIAGNOSIS — R109 Unspecified abdominal pain: Secondary | ICD-10-CM | POA: Diagnosis not present

## 2023-05-18 DIAGNOSIS — I2510 Atherosclerotic heart disease of native coronary artery without angina pectoris: Secondary | ICD-10-CM | POA: Diagnosis present

## 2023-05-18 DIAGNOSIS — Z860100 Personal history of colon polyps, unspecified: Secondary | ICD-10-CM

## 2023-05-18 DIAGNOSIS — E039 Hypothyroidism, unspecified: Secondary | ICD-10-CM | POA: Diagnosis not present

## 2023-05-18 DIAGNOSIS — Z8673 Personal history of transient ischemic attack (TIA), and cerebral infarction without residual deficits: Secondary | ICD-10-CM

## 2023-05-18 DIAGNOSIS — Z79890 Hormone replacement therapy (postmenopausal): Secondary | ICD-10-CM

## 2023-05-18 DIAGNOSIS — Z96612 Presence of left artificial shoulder joint: Secondary | ICD-10-CM | POA: Diagnosis present

## 2023-05-18 DIAGNOSIS — F40240 Claustrophobia: Secondary | ICD-10-CM | POA: Diagnosis present

## 2023-05-18 DIAGNOSIS — E118 Type 2 diabetes mellitus with unspecified complications: Secondary | ICD-10-CM

## 2023-05-18 DIAGNOSIS — Z79899 Other long term (current) drug therapy: Secondary | ICD-10-CM

## 2023-05-18 DIAGNOSIS — R110 Nausea: Secondary | ICD-10-CM

## 2023-05-18 DIAGNOSIS — I5030 Unspecified diastolic (congestive) heart failure: Secondary | ICD-10-CM | POA: Diagnosis present

## 2023-05-18 LAB — CBC WITH DIFFERENTIAL/PLATELET
Abs Immature Granulocytes: 0 10*3/uL (ref 0.00–0.07)
Basophils Absolute: 0.2 10*3/uL — ABNORMAL HIGH (ref 0.0–0.1)
Basophils Relative: 2 %
Eosinophils Absolute: 0 10*3/uL (ref 0.0–0.5)
Eosinophils Relative: 0 %
HCT: 35.8 % — ABNORMAL LOW (ref 36.0–46.0)
Hemoglobin: 10.9 g/dL — ABNORMAL LOW (ref 12.0–15.0)
Lymphocytes Relative: 11 %
Lymphs Abs: 0.8 10*3/uL (ref 0.7–4.0)
MCH: 18.1 pg — ABNORMAL LOW (ref 26.0–34.0)
MCHC: 30.4 g/dL (ref 30.0–36.0)
MCV: 59.6 fL — ABNORMAL LOW (ref 80.0–100.0)
Monocytes Absolute: 0.3 10*3/uL (ref 0.1–1.0)
Monocytes Relative: 4 %
Neutro Abs: 6.4 10*3/uL (ref 1.7–7.7)
Neutrophils Relative %: 83 %
Platelets: 178 10*3/uL (ref 150–400)
RBC: 6.01 MIL/uL — ABNORMAL HIGH (ref 3.87–5.11)
RDW: 17.2 % — ABNORMAL HIGH (ref 11.5–15.5)
WBC: 7.7 10*3/uL (ref 4.0–10.5)
nRBC: 0 % (ref 0.0–0.2)
nRBC: 0 /100{WBCs}

## 2023-05-18 LAB — COMPREHENSIVE METABOLIC PANEL
ALT: 23 U/L (ref 0–44)
AST: 30 U/L (ref 15–41)
Albumin: 3.9 g/dL (ref 3.5–5.0)
Alkaline Phosphatase: 61 U/L (ref 38–126)
Anion gap: 13 (ref 5–15)
BUN: 26 mg/dL — ABNORMAL HIGH (ref 8–23)
CO2: 17 mmol/L — ABNORMAL LOW (ref 22–32)
Calcium: 10.3 mg/dL (ref 8.9–10.3)
Chloride: 104 mmol/L (ref 98–111)
Creatinine, Ser: 1.06 mg/dL — ABNORMAL HIGH (ref 0.44–1.00)
GFR, Estimated: 53 mL/min — ABNORMAL LOW (ref >60)
Glucose, Bld: 159 mg/dL — ABNORMAL HIGH (ref 70–99)
Potassium: 3.6 mmol/L (ref 3.5–5.1)
Sodium: 134 mmol/L — ABNORMAL LOW (ref 135–145)
Total Bilirubin: 1.1 mg/dL (ref 0.0–1.2)
Total Protein: 8 g/dL (ref 6.5–8.1)

## 2023-05-18 LAB — MAGNESIUM: Magnesium: 1.9 mg/dL (ref 1.7–2.4)

## 2023-05-18 LAB — URINALYSIS, W/ REFLEX TO CULTURE (INFECTION SUSPECTED)
Bilirubin Urine: NEGATIVE
Glucose, UA: >=500 mg/dL — AB
Hgb urine dipstick: NEGATIVE
Ketones, ur: 5 mg/dL — AB
Nitrite: NEGATIVE
Protein, ur: NEGATIVE mg/dL
Specific Gravity, Urine: 1.021 (ref 1.005–1.030)
WBC, UA: >50 WBC/hpf (ref 0–5)
pH: 5 (ref 5.0–8.0)

## 2023-05-18 LAB — I-STAT CG4 LACTIC ACID, ED
Lactic Acid, Venous: 2.7 mmol/L (ref 0.5–1.9)
Lactic Acid, Venous: 2.7 mmol/L (ref 0.5–1.9)
Lactic Acid, Venous: 1.5 mmol/L (ref 0.5–1.9)

## 2023-05-18 LAB — CBG MONITORING, ED: Glucose-Capillary: 151 mg/dL — ABNORMAL HIGH (ref 70–99)

## 2023-05-18 MED ORDER — SODIUM CHLORIDE 0.9 % IV SOLN
2.0000 g | INTRAVENOUS | Status: DC
  Administered 2023-05-19 – 2023-05-20 (×2): 2 g via INTRAVENOUS
  Filled 2023-05-18 (×3): qty 20

## 2023-05-18 MED ORDER — IOHEXOL 350 MG/ML SOLN
75.0000 mL | Freq: Once | INTRAVENOUS | Status: AC | PRN
  Administered 2023-05-18: 75 mL via INTRAVENOUS

## 2023-05-18 MED ORDER — APIXABAN 5 MG PO TABS
5.0000 mg | ORAL_TABLET | Freq: Two times a day (BID) | ORAL | Status: DC
  Administered 2023-05-18 – 2023-05-21 (×6): 5 mg via ORAL
  Filled 2023-05-18 (×6): qty 1

## 2023-05-18 MED ORDER — ONDANSETRON HCL 4 MG/2ML IJ SOLN
4.0000 mg | Freq: Once | INTRAMUSCULAR | Status: AC
  Administered 2023-05-18: 4 mg via INTRAVENOUS
  Filled 2023-05-18: qty 2

## 2023-05-18 MED ORDER — INSULIN ASPART 100 UNIT/ML IJ SOLN
0.0000 [IU] | Freq: Three times a day (TID) | INTRAMUSCULAR | Status: DC
  Administered 2023-05-19 (×3): 2 [IU] via SUBCUTANEOUS
  Administered 2023-05-20 (×2): 3 [IU] via SUBCUTANEOUS
  Administered 2023-05-20 – 2023-05-21 (×2): 2 [IU] via SUBCUTANEOUS

## 2023-05-18 MED ORDER — LACTULOSE 10 GM/15ML PO SOLN: 10.0000 g | Freq: Once | ORAL | Status: DC

## 2023-05-18 MED ORDER — SODIUM CHLORIDE 0.9 % IV SOLN: INTRAVENOUS | Status: AC

## 2023-05-18 MED ORDER — MIRABEGRON ER 25 MG PO TB24
25.0000 mg | ORAL_TABLET | Freq: Every day | ORAL | Status: DC
  Administered 2023-05-19 – 2023-05-21 (×3): 25 mg via ORAL
  Filled 2023-05-18 (×4): qty 1

## 2023-05-18 MED ORDER — SIMETHICONE 40 MG/0.6ML PO SUSP
80.0000 mg | Freq: Once | ORAL | Status: DC
  Filled 2023-05-18: qty 1.2

## 2023-05-18 MED ORDER — SODIUM CHLORIDE 0.9% FLUSH
3.0000 mL | Freq: Two times a day (BID) | INTRAVENOUS | Status: DC
  Administered 2023-05-19 – 2023-05-21 (×4): 3 mL via INTRAVENOUS

## 2023-05-18 MED ORDER — LORAZEPAM 0.5 MG PO TABS
0.5000 mg | ORAL_TABLET | Freq: Four times a day (QID) | ORAL | Status: AC | PRN
  Administered 2023-05-18 – 2023-05-19 (×3): 0.5 mg via ORAL
  Filled 2023-05-18 (×4): qty 1

## 2023-05-18 MED ORDER — ALLOPURINOL 100 MG PO TABS
100.0000 mg | ORAL_TABLET | Freq: Every day | ORAL | Status: DC
  Administered 2023-05-19 – 2023-05-21 (×3): 100 mg via ORAL
  Filled 2023-05-18 (×3): qty 1

## 2023-05-18 MED ORDER — SODIUM CHLORIDE 0.9 % IV SOLN: 1.0000 g | Freq: Once | INTRAVENOUS | Status: DC

## 2023-05-18 MED ORDER — SODIUM CHLORIDE 0.9 % IV BOLUS
500.0000 mL | Freq: Once | INTRAVENOUS | Status: AC
  Administered 2023-05-18: 500 mL via INTRAVENOUS

## 2023-05-18 MED ORDER — HALOPERIDOL LACTATE 5 MG/ML IJ SOLN
5.0000 mg | Freq: Four times a day (QID) | INTRAMUSCULAR | Status: DC | PRN
  Administered 2023-05-18: 5 mg via INTRAVENOUS
  Filled 2023-05-18: qty 1

## 2023-05-18 MED ORDER — ACETAMINOPHEN 650 MG RE SUPP: 650.0000 mg | Freq: Four times a day (QID) | RECTAL | Status: DC | PRN

## 2023-05-18 MED ORDER — HYDROMORPHONE HCL 1 MG/ML IJ SOLN
1.0000 mg | INTRAMUSCULAR | Status: DC | PRN
  Administered 2023-05-18 – 2023-05-19 (×2): 1 mg via INTRAVENOUS
  Filled 2023-05-18: qty 1

## 2023-05-18 MED ORDER — SODIUM CHLORIDE 0.9 % IV SOLN
1.0000 g | Freq: Once | INTRAVENOUS | Status: AC
  Administered 2023-05-18: 1 g via INTRAVENOUS
  Filled 2023-05-18: qty 10

## 2023-05-18 MED ORDER — FENTANYL CITRATE PF 50 MCG/ML IJ SOSY
50.0000 ug | PREFILLED_SYRINGE | Freq: Once | INTRAMUSCULAR | Status: AC | PRN
  Administered 2023-05-18: 50 ug via INTRAVENOUS
  Filled 2023-05-18: qty 1

## 2023-05-18 MED ORDER — LEVOTHYROXINE SODIUM 25 MCG PO TABS
137.0000 ug | ORAL_TABLET | Freq: Every morning | ORAL | Status: DC
  Administered 2023-05-19 – 2023-05-21 (×3): 137 ug via ORAL
  Filled 2023-05-18 (×3): qty 1

## 2023-05-18 MED ORDER — LORAZEPAM 2 MG/ML IJ SOLN
0.5000 mg | Freq: Once | INTRAMUSCULAR | Status: AC
  Administered 2023-05-18: 0.5 mg via INTRAVENOUS
  Filled 2023-05-18: qty 1

## 2023-05-18 MED ORDER — HYDROMORPHONE HCL 1 MG/ML IJ SOLN
1.0000 mg | Freq: Once | INTRAMUSCULAR | Status: DC
  Filled 2023-05-18: qty 1

## 2023-05-18 MED ORDER — FENTANYL CITRATE PF 50 MCG/ML IJ SOSY
25.0000 ug | PREFILLED_SYRINGE | Freq: Four times a day (QID) | INTRAMUSCULAR | Status: DC | PRN
  Administered 2023-05-18: 25 ug via INTRAVENOUS
  Filled 2023-05-18: qty 1

## 2023-05-18 MED ORDER — ACETAMINOPHEN 325 MG PO TABS
650.0000 mg | ORAL_TABLET | Freq: Four times a day (QID) | ORAL | Status: DC | PRN
  Administered 2023-05-18 – 2023-05-20 (×3): 650 mg via ORAL
  Filled 2023-05-18 (×4): qty 2

## 2023-05-18 MED ORDER — MORPHINE SULFATE (PF) 4 MG/ML IV SOLN
4.0000 mg | Freq: Once | INTRAVENOUS | Status: AC
  Administered 2023-05-18: 4 mg via INTRAVENOUS
  Filled 2023-05-18: qty 1

## 2023-05-18 NOTE — Assessment & Plan Note (Signed)
Latest Reference Range & Units 05/18/23 10:40 05/18/23 13:12  Lactic Acid, Venous 0.5 - 1.9 mmol/L 2.7 (HH) 2.7 (HH)   Repeat level pending.  Due to combination of medication, sepsis  presentation. Will continue with low-grade maintenance IV fluid as patient has a history of CHF.

## 2023-05-18 NOTE — Assessment & Plan Note (Addendum)
Patient is on Vistaril as needed.  Will start patient on as needed Ativan low-dose x 3.

## 2023-05-18 NOTE — Assessment & Plan Note (Addendum)
Patient continued on Eliquis.  Exam currently nonfocal.

## 2023-05-18 NOTE — Assessment & Plan Note (Addendum)
Stable patient is clinically euvolemic. Patient's diuretic currently held secondary to elevated lactic acid although blood pressure is within normal limits. Clinically patient appears dehydrated, and has dry mucous membranes. Strict I's and O's, daily weights.

## 2023-05-18 NOTE — Assessment & Plan Note (Addendum)
    Latest Ref Rng & Units 05/18/2023   10:30 AM 04/12/2023    2:11 PM 04/06/2023    2:17 AM  CBC  WBC 4.0 - 10.5 K/uL 7.7  5.6  6.8   Hemoglobin 12.0 - 15.0 g/dL 78.2  95.6  8.9   Hematocrit 36.0 - 46.0 % 35.8  34.7  28.7   Platelets 150 - 400 K/uL 178  181.0 Repeated and verified X2.  153   Patient's hemoglobin today is 10.9 is stable from her previous hemoglobin.  Will follow.  Patient's degree of microcytosis is severe and would recommend outpatient follow-up with hematology oncology, most recent MCV is 59.6.

## 2023-05-18 NOTE — ED Notes (Signed)
Initially wasted 1mg  (0.99mL) ativan in med room. After entering room and reviewing orders, wasted an additional 0.5mg  (0.56mL) in sharps. Pt received a total of 0.5 mg ativan as ordered. See note from [place], RN in [facility] [address] witnessing that this was amount wasted.

## 2023-05-18 NOTE — ED Notes (Signed)
Noted patients O2 at 80% after receiving morphine and ativan while on room air - applied Show Low 2L. Improved to 100% on the 2L

## 2023-05-18 NOTE — ED Notes (Signed)
This RN able to reach spouse Lucious Groves), updated that patient is to be admitted, he would like to be called when patient gets an inpatient bed.

## 2023-05-18 NOTE — ED Notes (Signed)
Returned from CT scanner, sleeping at this time

## 2023-05-18 NOTE — ED Notes (Signed)
After [place], RN provided IV medications for pain and anxiety, patient became verbally aggressive. States to both RN are not helping her. Becomes more agitated after stating to RN "You left and never came back!" RN states to patient that it was 9 minutes between visits to room. Pt begins screaming at RN. Difficult to redirect. After several minutes, pt is left with call light and lights dimmed with understanding that further medication needs will be evaluated appropriately.

## 2023-05-18 NOTE — ED Provider Notes (Signed)
Pt signed out by Dr. Rush Landmark pending call back from the hospitalist.  Pt d/w Dr. Renaldo Reel for admission.   Jacalyn Lefevre, MD 05/18/23 [phone]

## 2023-05-18 NOTE — ED Triage Notes (Addendum)
Pt dx with UTI not improved with amoxicillin and now on cipro. Continues to have cloudy urine, body aches, nausea, and generalized weakness. Patient also reports that she has not had a bowel movement in six. Taking OTC meds for same without improvement.

## 2023-05-18 NOTE — ED Provider Notes (Signed)
[place] EMERGENCY DEPARTMENT AT [facility] Provider Note   CSN: [id number] Arrival date & time: 05/18/23  4098     History  Chief Complaint  Patient presents with   Urinary Tract Infection    Breanna Webster is a 78 y.o. female.  The history is provided by the patient and medical records. No language interpreter was used.  Urinary Tract Infection Associated symptoms: abdominal pain and nausea   Associated symptoms: no fever and no vomiting   Abdominal Pain Pain location:  Generalized Pain quality: aching, cramping and sharp   Pain radiates to:  Back and L flank Pain severity:  Severe Onset quality:  Gradual Duration:  1 day Timing:  Constant Progression:  Worsening Chronicity:  New Relieved by:  Nothing Worsened by:  Palpation Ineffective treatments:  None tried Associated symptoms: belching, chills, constipation, dysuria, fatigue and nausea   Associated symptoms: no chest pain, no cough, no diarrhea, no fever, no shortness of breath and no vomiting        Home Medications Prior to Admission medications   Medication Sig Start Date End Date Taking? Authorizing Provider  acetaminophen (TYLENOL) 500 MG tablet Take 1 tablet (500 mg total) by mouth 4 (four) times daily. 04/09/23   Arrien, York Ram, MD  allopurinol (ZYLOPRIM) 100 MG tablet Take 1 tablet (100 mg total) by mouth daily. 02/28/23     amitriptyline (ELAVIL) 25 MG tablet Take 1 tablet (25 mg total) by mouth at bedtime. 01/17/23   Nafziger, Kandee Keen, NP  amoxicillin-clavulanate (AUGMENTIN) 875-125 MG tablet Take 1 tablet by mouth 2 (two) times daily. 05/17/23   Nafziger, Kandee Keen, NP  apixaban (ELIQUIS) 5 MG TABS tablet Take 1 tablet (5 mg total) by mouth 2 (two) times daily. 02/21/23   Shirline Frees, NP  b complex vitamins capsule Take 1 capsule by mouth daily.    [provider]  bisacodyl (DULCOLAX) 5 MG EC tablet Take 1 Tablet with each dose pf pain medicine. 05/15/23   Shirline Frees, NP   Cholecalciferol (D3 PO) Take 1 tablet by mouth daily.    [provider]  ciprofloxacin (CIPRO) 500 MG tablet Take 1 tablet (500 mg total) by mouth 2 (two) times daily for 5 days. 05/15/23 05/21/23  Nafziger, Kandee Keen, NP  colchicine 0.6 MG tablet Take 1 tablet (0.6 mg total) by mouth daily. as needed for gout Patient not taking: Reported on 05/15/2023 02/28/23     diltiazem (CARTIA XT) 120 MG 24 hr capsule Take 1 capsule (120 mg total) by mouth daily. 02/03/23   Ronney Asters, NP  empagliflozin (JARDIANCE) 10 MG TABS tablet Take 1 tablet (10 mg total) by mouth daily. 04/10/23   Arrien, York Ram, MD  hydrOXYzine (VISTARIL) 25 MG capsule Take 1 capsule (25 mg total) by mouth every 8 (eight) hours as needed. 05/15/23   Nafziger, Kandee Keen, NP  Insulin NPH, Human,, Isophane, (HUMULIN N KWIKPEN) 100 UNIT/ML Kiwkpen Inject 20 units into the skin every morning and 14 units every evening. 11/01/22     insulin regular (HUMULIN R) 100 units/mL injection Inject 0.1 mLs (10 Units total) into the skin 3 (three) times daily before meals Patient taking differently: Inject 10-12 Units into the skin 3 (three) times daily. Sliding scale based on testing number 01/09/23     metFORMIN (GLUCOPHAGE-XR) 500 MG 24 hr tablet Take 1 tablet by mouth 2 times daily 08/29/22     Multiple Vitamin (MULITIVITAMIN WITH MINERALS) TABS Take 1 tablet by mouth daily.  [provider]  nitroGLYCERIN (NITROSTAT) 0.4 MG SL tablet Place 1 tablet (0.4 mg total) under the tongue every 5 (five) minutes as needed for chest pain. 05/15/23   Ronney Asters, NP  omeprazole (PRILOSEC) 20 MG capsule Take 1 capsule (20 mg total) by mouth daily. 04/04/23   [name], [name], PA-C  ondansetron (ZOFRAN) 4 MG tablet Take 1 tablet (4 mg total) by mouth every 8 (eight) hours as needed for nausea or vomiting. 04/04/23   [name], [name], PA-C  polyethylene glycol (MIRALAX) 17 g packet Take 17 g by mouth daily as needed. 12/19/22   [name],  Willaim Rayas, MD  potassium chloride SA (KLOR-CON M) 20 MEQ tablet Take 0.5 tablets (10 mEq total) by mouth daily. Take in concurrence with torsemide. 04/09/23   Arrien, York Ram, MD  spironolactone (ALDACTONE) 25 MG tablet Take 0.5 tablets (12.5 mg total) by mouth daily. 05/16/23 06/16/23  Nafziger, Kandee Keen, NP  SYNTHROID 137 MCG tablet Take 1 tablet (137 mcg total) by mouth every morning on empty stomach. 01/24/23     tiZANidine (ZANAFLEX) 4 MG tablet Take 1 tablet (4 mg total) by mouth 3 (three) times daily. Patient taking differently: Take 4 mg by mouth daily as needed for muscle spasms. 01/09/23     torsemide (DEMADEX) 20 MG tablet Take 2 tablets (40 mg total) by mouth daily. 04/09/23   Arrien, York Ram, MD  Vibegron (GEMTESA) 75 MG TABS Take 75 mg by mouth at bedtime.    [provider]  vitamin E 180 MG (400 UNITS) capsule Take 400 Units by mouth daily.    [provider]      Allergies    Statins    Review of Systems   Review of Systems  Constitutional:  Positive for chills and fatigue. Negative for fever.  Respiratory:  Negative for cough, chest tightness, shortness of breath and wheezing.   Cardiovascular:  Negative for chest pain and leg swelling.  Gastrointestinal:  Positive for abdominal pain, constipation and nausea. Negative for diarrhea and vomiting.  Genitourinary:  Positive for difficulty urinating and dysuria.  Musculoskeletal:  Positive for back pain. Negative for neck pain and neck stiffness.  Skin:  Negative for pallor, rash and wound.  Neurological:  Negative for light-headedness and headaches.  Psychiatric/Behavioral:  Positive for agitation. Negative for confusion.   All other systems reviewed and are negative.   Physical Exam Updated Vital Signs BP 126/65 (BP Location: Right Arm)   Pulse 90   Temp 97.8 F (36.6 C) (Oral)   Resp 14   SpO2 100%  Physical Exam Vitals and nursing note reviewed.  Constitutional:      General: She is not  in acute distress.    Appearance: She is well-developed. She is ill-appearing. She is not toxic-appearing or diaphoretic.  HENT:     Head: Normocephalic and atraumatic.     Nose: No congestion or rhinorrhea.     Mouth/Throat:     Mouth: Mucous membranes are moist.     Pharynx: No oropharyngeal exudate or posterior oropharyngeal erythema.  Eyes:     Extraocular Movements: Extraocular movements intact.     Conjunctiva/sclera: Conjunctivae normal.     Pupils: Pupils are equal, round, and reactive to light.  Cardiovascular:     Rate and Rhythm: Normal rate and regular rhythm.     Heart sounds: No murmur heard. Pulmonary:     Effort: Pulmonary effort is normal. No respiratory distress.     Breath sounds:  Normal breath sounds. No wheezing, rhonchi or rales.  Chest:     Chest wall: No tenderness.  Abdominal:     Palpations: Abdomen is soft.     Tenderness: There is abdominal tenderness. There is left CVA tenderness. There is no right CVA tenderness.  Musculoskeletal:        General: No swelling or tenderness.     Cervical back: Neck supple.     Right lower leg: Edema (improved from prior per family) present.     Left lower leg: Edema (improved from prior per family) present.  Skin:    General: Skin is warm and dry.     Capillary Refill: Capillary refill takes less than 2 seconds.     Findings: No erythema or rash.  Neurological:     General: No focal deficit present.     Mental Status: She is alert.  Psychiatric:        Mood and Affect: Mood normal.     ED Results / Procedures / Treatments   Labs (all labs ordered are listed, but only abnormal results are displayed) Labs Reviewed  COMPREHENSIVE METABOLIC PANEL - Abnormal; Notable for the following components:      Result Value   Sodium 134 (*)    CO2 17 (*)    Glucose, Bld 159 (*)    BUN 26 (*)    Creatinine, Ser 1.06 (*)    GFR, Estimated 53 (*)    All other components within normal limits  CBC WITH  DIFFERENTIAL/PLATELET - Abnormal; Notable for the following components:   RBC 6.01 (*)    Hemoglobin 10.9 (*)    HCT 35.8 (*)    MCV 59.6 (*)    MCH 18.1 (*)    RDW 17.2 (*)    Basophils Absolute 0.2 (*)    All other components within normal limits  URINALYSIS, W/ REFLEX TO CULTURE (INFECTION SUSPECTED) - Abnormal; Notable for the following components:   APPearance HAZY (*)    Glucose, UA >=500 (*)    Ketones, ur 5 (*)    Leukocytes,Ua MODERATE (*)    Bacteria, UA RARE (*)    All other components within normal limits  I-STAT CG4 LACTIC ACID, ED - Abnormal; Notable for the following components:   Lactic Acid, Venous 2.7 (*)    All other components within normal limits  I-STAT CG4 LACTIC ACID, ED - Abnormal; Notable for the following components:   Lactic Acid, Venous 2.7 (*)    All other components within normal limits  URINE CULTURE    EKG None  Radiology CT ABDOMEN PELVIS W CONTRAST Result Date: 05/18/2023 CLINICAL DATA:  Severe abdominal and flank pain. Nausea. Urinary tract infection. Constipation. EXAM: CT ABDOMEN AND PELVIS WITH CONTRAST TECHNIQUE: Multidetector CT imaging of the abdomen and pelvis was performed using the standard protocol following bolus administration of intravenous contrast. RADIATION DOSE REDUCTION: This exam was performed according to the departmental dose-optimization program which includes automated exposure control, adjustment of the mA and/or kV according to patient size and/or use of iterative reconstruction technique. CONTRAST:  75mL OMNIPAQUE IOHEXOL 350 MG/ML SOLN COMPARISON:  03/25/2023 FINDINGS: Lower Chest: No acute findings. Hepatobiliary: No suspicious hepatic masses identified. Mild capsular nodularity is suspicious for cirrhosis. Prior cholecystectomy. No evidence of biliary obstruction. Pancreas: No mass or inflammatory changes. Diffuse fatty replacement again noted. Spleen: Within normal limits in size and appearance. Adrenals/Urinary Tract: No  suspicious masses identified. No evidence of ureteral calculi or hydronephrosis. Foley catheter seen in  the bladder which is empty. Stomach/Bowel: No evidence of obstruction, inflammatory process or abnormal fluid collections. Normal appendix visualized. Diffuse colonic diverticulosis again seen, without signs of diverticulitis. Vascular/Lymphatic: No pathologically enlarged lymph nodes. No acute vascular findings. Reproductive: 2 cm left posterior uterine fibroid again seen. Adnexal regions are unremarkable. Other:  None. Musculoskeletal:  No suspicious bone lesions identified. IMPRESSION: No acute findings. Colonic diverticulosis, without radiographic evidence of diverticulitis. Small uterine fibroid. Probable hepatic cirrhosis. Electronically Signed   By: Danae Orleans M.D.   On: 05/18/2023 14:28    Procedures Procedures    Medications Ordered in ED Medications  cefTRIAXone (ROCEPHIN) 1 g in sodium chloride 0.9 % 100 mL IVPB (has no administration in time range)  sodium chloride 0.9 % bolus 500 mL (0 mLs Intravenous Stopped 05/18/23 1308)  ondansetron (ZOFRAN) injection 4 mg (4 mg Intravenous Given 05/18/23 1032)  fentaNYL (SUBLIMAZE) injection 50 mcg (50 mcg Intravenous Given 05/18/23 1220)  morphine (PF) 4 MG/ML injection 4 mg (4 mg Intravenous Given 05/18/23 1301)  LORazepam (ATIVAN) injection 0.5 mg (0.5 mg Intravenous Given by Other 05/18/23 1300)  iohexol (OMNIPAQUE) 350 MG/ML injection 75 mL (75 mLs Intravenous Contrast Given 05/18/23 1346)    ED Course/ Medical Decision Making/ A&P                                 Medical Decision Making Amount and/or Complexity of Data Reviewed Labs: ordered. Radiology: ordered.  Risk Prescription drug management.    Breanna Webster is a 78 y.o. female with a past medical history significant for hypertension, paroxysmal atrial fibrillation on Eliquis therapy, CAD, diabetes, parkinsonism, previous TAVR, CHF, previous urinary tract infection  currently on Augmentin after failing Cipro, previous strokes, cirrhosis, thyroid disease, hyperlipidemia, previous diverticulitis, and Graves' disease who presents with severe pain in abdomen going around to left back, burping, nausea, fatigue, malaise, dysuria, and inability urinate.  According to patient and family, she has been on 2 different antibiotics in the last 2 days and is still in urinary symptoms.  She reports last time she urinated it was burning and painful and she has not had normal urination since yesterday.  She is having worsening abdominal pain in her abdomen going down towards her back especially on the left side.  Denies trauma.  Reports has not had a bowel movement 6 days but is still passing some gas.  No history of bowel structures reported.  No trauma reported.  She denies any chest pain short of breath or cough but is feeling very ill.  On exam, abdomen is quite tender.  I did appreciate some bowel sounds.  Lungs were clear and chest was nontender.  Back was tender problem in the left lower back and left CVA area.  Midline was nontender.  Patient has a mild edema in legs but family reports that is improved.  Clinically I am concerned about urinary retention as she is not able to urinate for several hours here.  Bladder scan showed she had some urine present and a catheter will be placed.  Will get urinalysis and a culture.  She had some labs with an elevated lactic acid.  We will get CT scan to make sure she does not have a bowel obstruction, diverticulitis, large pyelonephritis, or other acute abnormality.  Will give pain medicine if her symptoms not improved after Foley placement.  As she is on 2 different antibiotics and still having  significant urinary symptoms, patient may end up requiring IV antibiotics and admission for failing outpatient antibiotics for UTI.  Anticipate reassessment after workup.  3:03 PM Urinalysis after Foley placement does show evidence of persistent  UTI.  Given the patient's pain going towards her flank having such severe symptoms I suspect she has early pyelonephritis.  CT scan was completed without evidence of bowel obstruction or acute diverticulitis.  She has diverticulosis.  Given the patient's pain, pyelonephritis, over the lactic acid, and report of failing to previous antibiotics, I do feel she needs IV antibiotics and admission for symptom control and treatment.  Will call medicine for admission.         Final Clinical Impression(s) / ED Diagnoses Final diagnoses:  Pyelonephritis  Abdominal pain, unspecified abdominal location  Urinary retention  Flank pain  Nausea    Clinical Impression: 1. Pyelonephritis   2. Abdominal pain, unspecified abdominal location   3. Urinary retention   4. Flank pain   5. Nausea     Disposition: Admit  This note was prepared with assistance of Dragon voice recognition software. Occasional wrong-word or sound-a-like substitutions may have occurred due to the inherent limitations of voice recognition software.       Navina Wohlers, Canary Brim, MD 05/18/23 [phone]

## 2023-05-18 NOTE — Assessment & Plan Note (Addendum)
Latest Reference Range & Units 05/18/23 10:40 05/18/23 13:12  Lactic Acid, Venous 0.5 - 1.9 mmol/L 2.7 (HH) 2.7 (HH)   Repeat level pending.  Due to combination of medication, sepsis  presentation. Will continue with low-grade maintenance IV fluid as patient has a history of CHF.

## 2023-05-18 NOTE — Assessment & Plan Note (Addendum)
Patient was sinus on auscultation, will continue patient's Eliquis.

## 2023-05-18 NOTE — Assessment & Plan Note (Addendum)
Vitals:   05/18/23 1005 05/18/23 1115 05/18/23 1130 05/18/23 1200  BP: 126/65 128/82 (!) 122/91 128/65   05/18/23 1300 05/18/23 1330 05/18/23 1430 05/18/23 1500  BP: 131/69 124/62 123/71 125/74   05/18/23 1515 05/18/23 1530 05/18/23 1730 05/18/23 1922  BP: 130/81 130/67 114/81 128/73  Reviewed med list and discussed with husband that tonight we will actually hold her diltiazem her torsemide her Aldactone and start her on low-dose IV fluids. Will follow patient's lactic acid level and hold her metformin as well.

## 2023-05-18 NOTE — ED Provider Triage Note (Signed)
Emergency Medicine Provider Triage Evaluation Note  Breanna Webster , a 78 y.o. female  was evaluated in triage.  Pt complains of unwell.  Review of Systems  Positive: Nausea, vomiting, "sick" Negative: Chest pain, cough  Physical Exam  BP 126/65 (BP Location: Right Arm)   Pulse 90   Temp 97.8 F (36.6 C) (Oral)   Resp 14   SpO2 100%  Gen:   Awake, no distress  Uncomfortable Resp:  Normal effort  MSK:   Moves extremities without difficulty  Other:  Abdomen largely nontender.  Medical Decision Making  Medically screening exam initiated at 10:25 AM.  Appropriate orders placed.  Breanna Webster was informed that the remainder of the evaluation will be completed by another provider, this initial triage assessment does not replace that evaluation, and the importance of remaining in the ED until their evaluation is complete.  Being treated for UTI with initial symptoms including nausea and vomiting. Amoxil first, now on cipro - no better. Body aches, persistent nausea, weakness.   Elpidio Anis, PA-C 05/18/23 1026

## 2023-05-18 NOTE — Progress Notes (Signed)
   05/18/23 2020  Spiritual Encounters  Type of Visit Initial  Care provided to: Patient  Conversation partners present during encounter Nurse  Referral source Nurse (RN/NT/LPN)  Reason for visit Urgent spiritual support  OnCall Visit Yes   Chaplain provided spiritual care to Pt upon nurse request. Pt was in distress because of physical pain and was in an agitated state. Chaplain gave Pt water upon request and tried to comfort Pt. Pt was falling asleep due to medication and then Chaplain left. Spoke to nurse and asked her to reach out to chaplains if needed for this Pt.    Chaplain Daron Offer

## 2023-05-18 NOTE — Assessment & Plan Note (Addendum)
Also obtain a swallow evaluation, start patient on a carb consistent cardiac diet.  Manage patient on glycemic protocol.  Discussed with husband to discuss with her PCP and cardiology to either lower the dose of Jardiance or ideally discontinuing the probable recurrent UTIs secondary to her SGLT2 inhibitor.  Pt last A1c is 6.2.

## 2023-05-18 NOTE — ED Notes (Addendum)
In patient's room with Quintella Reichert, RN. Pt expresses anxiety and that she is still in pain. She is unable to tolerate CT scan at this time due to this anxiety. EDP notified. Medications prescribed. See MAR.

## 2023-05-18 NOTE — Assessment & Plan Note (Addendum)
  Intake/Output Summary (Last 24 hours) at 05/18/2023 1952 Last data filed at 05/18/2023 1744 Gross per 24 hour  Intake 750 ml  Output 450 ml  Net 300 ml    sodium chloride 75 mL/hr (05/18/23 1949)   [START ON 05/19/2023] cefTRIAXone (ROCEPHIN)  IV     Continue with MIVF. Hold bp meds/ diuretic and metformin.

## 2023-05-18 NOTE — ED Notes (Signed)
Husband Breanna Webster [phone] would like an update asap and called when his wife is moved upstairs

## 2023-05-18 NOTE — ED Notes (Signed)
Pt states she was on Cipro but is now on Augmentin, first dose was yesterday

## 2023-05-18 NOTE — H&P (Addendum)
History and Physical    Patient: Breanna Webster WUJ:[id number] DOB: Feb 23, 1946 DOA: 05/18/2023 DOS: the patient was seen and examined on 05/18/2023 PCP: Shirline Frees, NP  Patient coming from: Home Chief complaint: Chief Complaint  Patient presents with   Urinary Tract Infection   HPI:  Breanna Webster is a 78 y.o. female with past medical history Anxiety, Arthritis, Atrial fibrillation with RVR (HCC) (10/21/2018), Back pain, Bacteremia due to Streptococcus Salivarius (10/07/2018), Blood transfusion without reported diagnosis, CAD (coronary artery disease), Carotid artery stenosis, Cerebellar stroke, acute (HCC) (10/21/2018), Cerebrovascular accident (CVA) due to embolism of right posterior cerebral artery (HCC) (08/03/2020), Chest pain, CHF (congestive heart failure) (HCC), Chronic lower back pain, Cirrhosis (HCC), Colon polyps, Diverticulitis, Diverticulosis, Esophageal thickening, GERD (gastroesophageal reflux disease), Gout, Grave's disease, H/O total shoulder replacement, left (06/01/2022), Heart murmur, History of cardioembolic stroke (95/62/1308), History of colonic polyps (05/22/2017), History of hiatal hernia, Hyperlipidemia, Hypertension, Hypothyroidism, IBS (irritable bowel syndrome), Osteopenia, Parkinson's syndrome (HCC) (04/23/2020), Pulmonary nodules, S/P TAVR (transcatheter aortic valve replacement), Shortness of breath on exertion, Stroke (HCC) (2021), Thalassemia minor, Thyroid disease, and Type II diabetes mellitus (HCC), was seen on January 22 where note states that patient had UTI symptoms 2 to 3 days prior and urinalysis was collected which resulted positive with pansensitive E. coli.  Patient was on ciprofloxacin and augmentin x 2 doses, with no improvement.  Patient comes to [name] with generalized weakness abdominal pain nausea, generalized abdominal pain that is radiating to the back and left flank.  >>ED Course: In emergency room alert awake oriented afebrile O2 sats 100% on  room air.  Patient gives history no distress. Foley placed in the ED.  Admission requested for pyelonephritis. Vitals:   05/18/23 1530 05/18/23 1730 05/18/23 1830 05/18/23 1922  BP: 130/67 114/81  128/73  Pulse: 94 94 89 99  Temp:    98.2 F (36.8 C)  Resp: 17 (!) 23 16 14   Height:      Weight:      SpO2: 100% 100% 100% 100%  TempSrc:    Oral  BMI (Calculated):      ED evaluation  so far shows: CMP shows AKI cr of 1.06 , Bicarb of 17. C/h anemia with hb of 10.9 Elevated lactic of 2.7    In the emergency room  pt has received the following treatment patient is received Rocephin along with normal saline 500 cc. Review of Systems  Unable to perform ROS: Acuity of condition  Gastrointestinal:  Positive for abdominal pain.   Past Medical History:  Diagnosis Date   Anxiety    Arthritis    "back" (04/22/2018)   Atrial fibrillation with RVR (HCC) 10/21/2018   Back pain    Bacteremia due to Streptococcus Salivarius 10/07/2018   Blood transfusion without reported diagnosis    CAD (coronary artery disease)    a. 03/2018 s/p PCI/DES to the RCA (3.0x15 Onyx DES).   Carotid artery stenosis    Mild   Cerebellar stroke, acute (HCC) 10/21/2018   Cerebrovascular accident (CVA) due to embolism of right posterior cerebral artery (HCC) 08/03/2020   Chest pain    CHF (congestive heart failure) (HCC)    Chronic lower back pain    Cirrhosis (HCC)    Colon polyps    Diverticulitis    Diverticulosis    Esophageal thickening    seen on pre TAVR CT scan, also questionable cirrhosis. MRI recommended. Will refer to GI   GERD (gastroesophageal reflux disease)    Gout  Grave's disease    H/O total shoulder replacement, left 06/01/2022   Heart murmur    History of cardioembolic stroke 03/29/2020   History of colonic polyps 05/22/2017   History of hiatal hernia    Hyperlipidemia    Hypertension    Hypothyroidism    IBS (irritable bowel syndrome)    Osteopenia    Parkinson's syndrome  (HCC) 04/23/2020   Pulmonary nodules    seen on pre TAVR CT. likley benign. no follow up recommended if pt low risk.   S/P TAVR (transcatheter aortic valve replacement)    Shortness of breath on exertion    Stroke (HCC) 2021   x2   Thalassemia minor    Thyroid disease    Type II diabetes mellitus (HCC)    Past Surgical History:  Procedure Laterality Date   70 HOUR PH STUDY N/A 03/03/2018   Procedure: 24 HOUR PH STUDY;  Surgeon: Napoleon Form, MD;  Location: [facility] ENDOSCOPY;  Service: Endoscopy;  Laterality: N/A;   COLONOSCOPY  2023   COLONOSCOPY W/ BIOPSIES AND POLYPECTOMY     CORONARY ANGIOGRAPHY Right 04/21/2018   Procedure: CORONARY ANGIOGRAPHY (CATH LAB);  Surgeon: Lyn Records, MD;  Location: [facility] INVASIVE CV LAB;  Service: Cardiovascular;  Laterality: Right;   CORONARY STENT INTERVENTION N/A 04/22/2018   Procedure: CORONARY STENT INTERVENTION;  Surgeon: Lyn Records, MD;  Location: [facility] INVASIVE CV LAB;  Service: Cardiovascular;  Laterality: N/A;   DILATION AND CURETTAGE OF UTERUS     ESOPHAGEAL MANOMETRY N/A 03/03/2018   Procedure: ESOPHAGEAL MANOMETRY (EM);  Surgeon: Napoleon Form, MD;  Location: [facility] ENDOS[facility];  Service: Endoscopy;  Laterality: N/A;   HYSTEROSCOPY     fibroids   LAPAROSCOPIC CHOLECYSTECTOMY  1985   LAPAROSCOPY     fibroids   NISSEN FUNDOPLICATION  1990s   POLYPECTOMY     REVERSE SHOULDER ARTHROPLASTY Left 06/01/2022   Procedure: REVERSE SHOULDER ARTHROPLASTY;  Surgeon: Beverely Low, MD;  Location: [facility] ORS;  Service: Orthopedics;  Laterality: Left;  choice with interscalene block   RIGHT/LEFT HEART CATH AND CORONARY ANGIOGRAPHY N/A 02/20/2018   Procedure: RIGHT/LEFT HEART CATH AND CORONARY ANGIOGRAPHY;  Surgeon: Lyn Records, MD;  Location: [facility] INVASIVE CV LAB;  Service: Cardiovascular;  Laterality: N/A;   TEE WITHOUT CARDIOVERSION N/A 07/08/2018   Procedure: TRANSESOPHAGEAL ECHOCARDIOGRAM (TEE);  Surgeon: Kathleene Hazel, MD;  [facility]  OR;  Service: Open Heart Surgery;  Laterality: N/A;   TEE WITHOUT CARDIOVERSION  10/07/2018   TEE WITHOUT CARDIOVERSION N/A 10/07/2018   Procedure: TRANSESOPHAGEAL ECHOCARDIOGRAM (TEE);  Surgeon: Jake Bathe, MD;  Location: [facility] ENDOSCOPY;  Service: Cardiovascular;  Laterality: N/A;   TONSILLECTOMY     age 16   TRANSCATHETER AORTIC VALVE REPLACEMENT, TRANSFEMORAL N/A 07/08/2018   Procedure: TRANSCATHETER AORTIC VALVE REPLACEMENT, TRANSFEMORAL;  Surgeon: Kathleene Hazel, MD;  Location: [facility] OR;  Service: Open Heart Surgery;  Laterality: N/A;    reports that she has never smoked. She has never been exposed to tobacco smoke. She has never used smokeless tobacco. She reports that she does not drink alcohol and does not use drugs.  Allergies  Allergen Reactions   Statins Other (See Comments)    Tried simvastatin, atorvastatin; on Repatha    Family History  Adopted: Yes  Problem Relation Age of Onset   Healthy Son        x 2   Headache Other        Cluster headaches   Heart failure Mother  Colon cancer Neg Hx    Pancreatic cancer Neg Hx    Rectal cancer Neg Hx    Stomach cancer Neg Hx     Prior to Admission medications   Medication Sig Start Date End Date Taking? Authorizing Provider  acetaminophen (TYLENOL) 500 MG tablet Take 1 tablet (500 mg total) by mouth 4 (four) times daily. 04/09/23   Arrien, York Ram, MD  allopurinol (ZYLOPRIM) 100 MG tablet Take 1 tablet (100 mg total) by mouth daily. 02/28/23     amitriptyline (ELAVIL) 25 MG tablet Take 1 tablet (25 mg total) by mouth at bedtime. 01/17/23   Nafziger, Kandee Keen, NP  amoxicillin-clavulanate (AUGMENTIN) 875-125 MG tablet Take 1 tablet by mouth 2 (two) times daily. 05/17/23   Nafziger, Kandee Keen, NP  apixaban (ELIQUIS) 5 MG TABS tablet Take 1 tablet (5 mg total) by mouth 2 (two) times daily. 02/21/23   Shirline Frees, NP  b complex vitamins capsule Take 1 capsule by mouth daily.    [provider]  bisacodyl  (DULCOLAX) 5 MG EC tablet Take 1 Tablet with each dose pf pain medicine. 05/15/23   Shirline Frees, NP  Cholecalciferol (D3 PO) Take 1 tablet by mouth daily.    [provider]  ciprofloxacin (CIPRO) 500 MG tablet Take 1 tablet (500 mg total) by mouth 2 (two) times daily for 5 days. 05/15/23 05/21/23  Nafziger, Kandee Keen, NP  colchicine 0.6 MG tablet Take 1 tablet (0.6 mg total) by mouth daily. as needed for gout Patient not taking: Reported on 05/15/2023 02/28/23     diltiazem (CARTIA XT) 120 MG 24 hr capsule Take 1 capsule (120 mg total) by mouth daily. 02/03/23   Ronney Asters, NP  empagliflozin (JARDIANCE) 10 MG TABS tablet Take 1 tablet (10 mg total) by mouth daily. 04/10/23   Arrien, York Ram, MD  hydrOXYzine (VISTARIL) 25 MG capsule Take 1 capsule (25 mg total) by mouth every 8 (eight) hours as needed. 05/15/23   Nafziger, Kandee Keen, NP  Insulin NPH, Human,, Isophane, (HUMULIN N KWIKPEN) 100 UNIT/ML Kiwkpen Inject 20 units into the skin every morning and 14 units every evening. 11/01/22     insulin regular (HUMULIN R) 100 units/mL injection Inject 0.1 mLs (10 Units total) into the skin 3 (three) times daily before meals Patient taking differently: Inject 10-12 Units into the skin 3 (three) times daily. Sliding scale based on testing number 01/09/23     metFORMIN (GLUCOPHAGE-XR) 500 MG 24 hr tablet Take 1 tablet by mouth 2 times daily 08/29/22     Multiple Vitamin (MULITIVITAMIN WITH MINERALS) TABS Take 1 tablet by mouth daily.    [provider]  nitroGLYCERIN (NITROSTAT) 0.4 MG SL tablet Place 1 tablet (0.4 mg total) under the tongue every 5 (five) minutes as needed for chest pain. 05/15/23   Ronney Asters, NP  omeprazole (PRILOSEC) 20 MG capsule Take 1 capsule (20 mg total) by mouth daily. 04/04/23   [name], [name], PA-C  ondansetron (ZOFRAN) 4 MG tablet Take 1 tablet (4 mg total) by mouth every 8 (eight) hours as needed for nausea or vomiting. 04/04/23   [name], [name], PA-C   polyethylene glycol (MIRALAX) 17 g packet Take 17 g by mouth daily as needed. 12/19/22   [name], Willaim Rayas, MD  potassium chloride SA (KLOR-CON M) 20 MEQ tablet Take 0.5 tablets (10 mEq total) by mouth daily. Take in concurrence with torsemide. 04/09/23   Arrien, York Ram, MD  spironolactone (ALDACTONE) 25 MG tablet Take 0.5  tablets (12.5 mg total) by mouth daily. 05/16/23 06/16/23  Nafziger, Kandee Keen, NP  SYNTHROID 137 MCG tablet Take 1 tablet (137 mcg total) by mouth every morning on empty stomach. 01/24/23     tiZANidine (ZANAFLEX) 4 MG tablet Take 1 tablet (4 mg total) by mouth 3 (three) times daily. Patient taking differently: Take 4 mg by mouth daily as needed for muscle spasms. 01/09/23     torsemide (DEMADEX) 20 MG tablet Take 2 tablets (40 mg total) by mouth daily. 04/09/23   Arrien, York Ram, MD  Vibegron (GEMTESA) 75 MG TABS Take 75 mg by mouth at bedtime.    [provider]  vitamin E 180 MG (400 UNITS) capsule Take 400 Units by mouth daily.    [provider]     Vitals:   05/18/23 1530 05/18/23 1730 05/18/23 1830 05/18/23 1922  BP: 130/67 114/81  128/73  Pulse: 94 94 89 99  Resp: 17 (!) 23 16 14   Temp:    98.2 F (36.8 C)  TempSrc:    Oral  SpO2: 100% 100% 100% 100%  Weight:      Height:       Physical Exam Vitals and nursing note reviewed.  Constitutional:      General: She is not in acute distress. HENT:     Head: Normocephalic and atraumatic.     Right Ear: Hearing normal.     Left Ear: Hearing normal.     Nose: Nose normal. No nasal deformity.     Mouth/Throat:     Lips: Pink.     Mouth: Mucous membranes are dry.     Tongue: No lesions.  Eyes:     General: Lids are normal.     Extraocular Movements: Extraocular movements intact.     Comments: Mild exophthalmos.  Cardiovascular:     Rate and Rhythm: Normal rate and regular rhythm.     Heart sounds: Normal heart sounds.  Pulmonary:     Effort: Pulmonary effort is normal.      Breath sounds: Normal breath sounds.  Abdominal:     General: Bowel sounds are normal. There is distension.     Palpations: Abdomen is soft. There is no mass.     Tenderness: There is abdominal tenderness.  Musculoskeletal:     Right lower leg: No edema.     Left lower leg: No edema.  Skin:    General: Skin is warm.  Neurological:     General: No focal deficit present.     Mental Status: She is alert and oriented to person, place, and time.     Cranial Nerves: Cranial nerves 2-12 are intact.  Psychiatric:        Attention and Perception: Attention normal.        Mood and Affect: Mood normal.        Speech: Speech normal.        Behavior: Behavior normal. Behavior is cooperative.      Labs on Admission: I have personally reviewed following labs and imaging studies Results for orders placed or performed during the hospital encounter of 05/18/23 (from the past 24 hours)  Comprehensive metabolic panel     Status: Abnormal   Collection Time: 05/18/23 10:30 AM  Result Value Ref Range   Sodium 134 (L) 135 - 145 mmol/L   Potassium 3.6 3.5 - 5.1 mmol/L   Chloride 104 98 - 111 mmol/L   CO2 17 (L) 22 - 32 mmol/L   Glucose, Bld 159 (  H) 70 - 99 mg/dL   BUN 26 (H) 8 - 23 mg/dL   Creatinine, Ser 0.98 (H) 0.44 - 1.00 mg/dL   Calcium 11.9 8.9 - 14.7 mg/dL   Total Protein 8.0 6.5 - 8.1 g/dL   Albumin 3.9 3.5 - 5.0 g/dL   AST 30 15 - 41 U/L   ALT 23 0 - 44 U/L   Alkaline Phosphatase 61 38 - 126 U/L   Total Bilirubin 1.1 0.0 - 1.2 mg/dL   GFR, Estimated 53 (L) >60 mL/min   Anion gap 13 5 - 15  CBC with Differential     Status: Abnormal   Collection Time: 05/18/23 10:30 AM  Result Value Ref Range   WBC 7.7 4.0 - 10.5 K/uL   RBC 6.01 (H) 3.87 - 5.11 MIL/uL   Hemoglobin 10.9 (L) 12.0 - 15.0 g/dL   HCT 82.9 (L) 56.2 - 13.0 %   MCV 59.6 (L) 80.0 - 100.0 fL   MCH 18.1 (L) 26.0 - 34.0 pg   MCHC 30.4 30.0 - 36.0 g/dL   RDW 86.5 (H) 78.4 - 69.6 %   Platelets 178 150 - 400 K/uL   nRBC 0.0  0.0 - 0.2 %   Neutrophils Relative % 83 %   Neutro Abs 6.4 1.7 - 7.7 K/uL   Lymphocytes Relative 11 %   Lymphs Abs 0.8 0.7 - 4.0 K/uL   Monocytes Relative 4 %   Monocytes Absolute 0.3 0.1 - 1.0 K/uL   Eosinophils Relative 0 %   Eosinophils Absolute 0.0 0.0 - 0.5 K/uL   Basophils Relative 2 %   Basophils Absolute 0.2 (H) 0.0 - 0.1 K/uL   Smear Review See Note    nRBC 0 0 /100 WBC   Abs Immature Granulocytes 0.00 0.00 - 0.07 K/uL   Schistocytes PRESENT    Tear Drop Cells PRESENT    Polychromasia PRESENT   Magnesium     Status: None   Collection Time: 05/18/23 10:30 AM  Result Value Ref Range   Magnesium 1.9 1.7 - 2.4 mg/dL  I-Stat Lactic Acid, ED     Status: Abnormal   Collection Time: 05/18/23 10:40 AM  Result Value Ref Range   Lactic Acid, Venous 2.7 (HH) 0.5 - 1.9 mmol/L   Comment NOTIFIED PHYSICIAN   Urinalysis, w/ Reflex to Culture (Infection Suspected) -Urine, Clean Catch     Status: Abnormal   Collection Time: 05/18/23 11:53 AM  Result Value Ref Range   Specimen Source URINE, CLEAN CATCH    Color, Urine YELLOW YELLOW   APPearance HAZY (A) CLEAR   Specific Gravity, Urine 1.021 1.005 - 1.030   pH 5.0 5.0 - 8.0   Glucose, UA >=500 (A) NEGATIVE mg/dL   Hgb urine dipstick NEGATIVE NEGATIVE   Bilirubin Urine NEGATIVE NEGATIVE   Ketones, ur 5 (A) NEGATIVE mg/dL   Protein, ur NEGATIVE NEGATIVE mg/dL   Nitrite NEGATIVE NEGATIVE   Leukocytes,Ua MODERATE (A) NEGATIVE   RBC / HPF 0-5 0 - 5 RBC/hpf   WBC, UA >50 0 - 5 WBC/hpf   Bacteria, UA RARE (A) NONE SEEN   Squamous Epithelial / HPF 0-5 0 - 5 /HPF   WBC Clumps PRESENT    Mucus PRESENT   I-Stat Lactic Acid, ED     Status: Abnormal   Collection Time: 05/18/23  1:12 PM  Result Value Ref Range   Lactic Acid, Venous 2.7 (HH) 0.5 - 1.9 mmol/L   Comment NOTIFIED PHYSICIAN    *Note: Due  to a large number of results and/or encounters for the requested time period, some results have not been displayed. A complete set of results  can be found in Results Review.   Recent Results (from the past 720 hours)  Urine Culture     Status: Abnormal   Collection Time: 05/15/23 12:09 PM   Specimen: Urine  Result Value Ref Range Status   MICRO NUMBER: [id number]  Final   SPECIMEN QUALITY: Adequate  Final   Sample Source URINE  Final   STATUS: FINAL  Final   ISOLATE 1: Escherichia coli (A)  Final    Comment: Greater than 100,000 CFU/mL of Escherichia coli      Susceptibility   Escherichia coli - URINE CULTURE, REFLEX    AMOX/CLAVULANIC <=2 Sensitive     AMPICILLIN 4 Sensitive     AMPICILLIN/SULBACTAM <=2 Sensitive     CEFAZOLIN* <=4 Not Reportable      * For infections other than uncomplicated UTI caused by E. coli, K. pneumoniae or P. mirabilis: Cefazolin is resistant if MIC > or = 8 mcg/mL. (Distinguishing susceptible versus intermediate for isolates with MIC < or = 4 mcg/mL requires additional testing.) For uncomplicated UTI caused by E. coli, K. pneumoniae or P. mirabilis: Cefazolin is susceptible if MIC <32 mcg/mL and predicts susceptible to the oral agents cefaclor, cefdinir, cefpodoxime, cefprozil, cefuroxime, cephalexin and loracarbef.     CEFTAZIDIME <=1 Sensitive     CEFEPIME <=1 Sensitive     CEFTRIAXONE <=1 Sensitive     CIPROFLOXACIN >=4 Resistant     LEVOFLOXACIN >=8 Resistant     GENTAMICIN <=1 Sensitive     IMIPENEM <=0.25 Sensitive     NITROFURANTOIN <=16 Sensitive     PIP/TAZO <=4 Sensitive     TOBRAMYCIN <=1 Sensitive     TRIMETH/SULFA* <=20 Sensitive      * For infections other than uncomplicated UTI caused by E. coli, K. pneumoniae or P. mirabilis: Cefazolin is resistant if MIC > or = 8 mcg/mL. (Distinguishing susceptible versus intermediate for isolates with MIC < or = 4 mcg/mL requires additional testing.) For uncomplicated UTI caused by E. coli, K. pneumoniae or P. mirabilis: Cefazolin is susceptible if MIC <32 mcg/mL and predicts susceptible to the oral agents cefaclor,  cefdinir, cefpodoxime, cefprozil, cefuroxime, cephalexin and loracarbef. Legend: S = Susceptible  I = Intermediate R = Resistant  NS = Not susceptible SDD = Susceptible Dose Dependent * = Not Tested  NR = Not Reported **NN = See Therapy Comments    CBC:    Latest Ref Rng & Units 05/18/2023   10:30 AM 04/12/2023    2:11 PM 04/06/2023    2:17 AM  CBC  WBC 4.0 - 10.5 K/uL 7.7  5.6  6.8   Hemoglobin 12.0 - 15.0 g/dL 57.8  46.9  8.9   Hematocrit 36.0 - 46.0 % 35.8  34.7  28.7   Platelets 150 - 400 K/uL 178  181.0 Repeated and verified X2.  153    Basic Metabolic Panel: Recent Labs  Lab 05/18/23 1030  NA 134*  K 3.6  CL 104  CO2 17*  GLUCOSE 159*  BUN 26*  CREATININE 1.06*  CALCIUM 10.3  MG 1.9   Creatinine: Lab Results  Component Value Date   CREATININE 1.06 (H) 05/18/2023   CREATININE 1.03 04/12/2023   CREATININE 0.98 04/09/2023   Liver Function Tests:    Latest Ref Rng & Units 05/18/2023   10:30 AM 04/05/2023    4:42 AM  04/04/2023    9:14 PM  Hepatic Function  Total Protein 6.5 - 8.1 g/dL 8.0  7.0  6.6   Albumin 3.5 - 5.0 g/dL 3.9  3.9  3.7   AST 15 - 41 U/L 30  35  29   ALT 0 - 44 U/L 23  23  22    Alk Phosphatase 38 - 126 U/L 61  78  72   Total Bilirubin 0.0 - 1.2 mg/dL 1.1  0.8  0.8    Radiological Exams on Admission: CT ABDOMEN PELVIS W CONTRAST Result Date: 05/18/2023 CLINICAL DATA:  Severe abdominal and flank pain. Nausea. Urinary tract infection. Constipation. EXAM: CT ABDOMEN AND PELVIS WITH CONTRAST TECHNIQUE: Multidetector CT imaging of the abdomen and pelvis was performed using the standard protocol following bolus administration of intravenous contrast. RADIATION DOSE REDUCTION: This exam was performed according to the departmental dose-optimization program which includes automated exposure control, adjustment of the mA and/or kV according to patient size and/or use of iterative reconstruction technique. CONTRAST:  75mL OMNIPAQUE IOHEXOL 350 MG/ML  SOLN COMPARISON:  03/25/2023 FINDINGS: Lower Chest: No acute findings. Hepatobiliary: No suspicious hepatic masses identified. Mild capsular nodularity is suspicious for cirrhosis. Prior cholecystectomy. No evidence of biliary obstruction. Pancreas: No mass or inflammatory changes. Diffuse fatty replacement again noted. Spleen: Within normal limits in size and appearance. Adrenals/Urinary Tract: No suspicious masses identified. No evidence of ureteral calculi or hydronephrosis. Foley catheter seen in the bladder which is empty. Stomach/Bowel: No evidence of obstruction, inflammatory process or abnormal fluid collections. Normal appendix visualized. Diffuse colonic diverticulosis again seen, without signs of diverticulitis. Vascular/Lymphatic: No pathologically enlarged lymph nodes. No acute vascular findings. Reproductive: 2 cm left posterior uterine fibroid again seen. Adnexal regions are unremarkable. Other:  None. Musculoskeletal:  No suspicious bone lesions identified. IMPRESSION: No acute findings. Colonic diverticulosis, without radiographic evidence of diverticulitis. Small uterine fibroid. Probable hepatic cirrhosis. Electronically Signed   By: Danae Orleans M.D.   On: 05/18/2023 14:28    Data Reviewed: Relevant notes from primary care and specialist visits, past discharge summaries as available in EHR, including Care Everywhere. Prior diagnostic testing as pertinent to current admission diagnoses, Updated medications and problem lists for reconciliation ED course, including vitals, labs, imaging, treatment and response to treatment,Triage notes, nursing and pharmacy notes and ED provider's notes Notable results as noted in HPI.Discussed case with EDMD/ ED APP/ or Specialty MD on call and as needed.  Assessment & Plan Pyelonephritis Cultures collected in the emergency room will follow urine culture CNS.  Currently continued on Rocephin.  Based on previous cultures patient had E. coli that was  pansensitive. Recent Results (from the past 720 hours)  Urine Culture     Status: Abnormal   Collection Time: 05/15/23 12:09 PM   Specimen: Urine  Result Value Ref Range Status   MICRO NUMBER: [id number]  Final   SPECIMEN QUALITY: Adequate  Final   Sample Source URINE  Final   STATUS: FINAL  Final   ISOLATE 1: Escherichia coli (A)  Final    Comment: Greater than 100,000 CFU/mL of Escherichia coli      Susceptibility   Escherichia coli - URINE CULTURE, REFLEX    AMOX/CLAVULANIC <=2 Sensitive     AMPICILLIN 4 Sensitive     AMPICILLIN/SULBACTAM <=2 Sensitive     CEFAZOLIN* <=4 Not Reportable      * For infections other than uncomplicated UTI caused by E. coli, K. pneumoniae or P. mirabilis: Cefazolin is resistant if MIC >  or = 8 mcg/mL. (Distinguishing susceptible versus intermediate for isolates with MIC < or = 4 mcg/mL requires additional testing.) For uncomplicated UTI caused by E. coli, K. pneumoniae or P. mirabilis: Cefazolin is susceptible if MIC <32 mcg/mL and predicts susceptible to the oral agents cefaclor, cefdinir, cefpodoxime, cefprozil, cefuroxime, cephalexin and loracarbef.     CEFTAZIDIME <=1 Sensitive     CEFEPIME <=1 Sensitive     CEFTRIAXONE <=1 Sensitive     CIPROFLOXACIN >=4 Resistant     LEVOFLOXACIN >=8 Resistant     GENTAMICIN <=1 Sensitive     IMIPENEM <=0.25 Sensitive     NITROFURANTOIN <=16 Sensitive     PIP/TAZO <=4 Sensitive     TOBRAMYCIN <=1 Sensitive     TRIMETH/SULFA* <=20 Sensitive      * For infections other than uncomplicated UTI caused by E. coli, K. pneumoniae or P. mirabilis: Cefazolin is resistant if MIC > or = 8 mcg/mL. (Distinguishing susceptible versus intermediate for isolates with MIC < or = 4 mcg/mL requires additional testing.) For uncomplicated UTI caused by E. coli, K. pneumoniae or P. mirabilis: Cefazolin is susceptible if MIC <32 mcg/mL and predicts susceptible to the oral agents cefaclor, cefdinir, cefpodoxime,  cefprozil, cefuroxime, cephalexin and loracarbef. Legend: S = Susceptible  I = Intermediate R = Resistant  NS = Not susceptible SDD = Susceptible Dose Dependent * = Not Tested  NR = Not Reported **NN = See Therapy Comments     Essential hypertension Vitals:   05/18/23 1005 05/18/23 1115 05/18/23 1130 05/18/23 1200  BP: 126/65 128/82 (!) 122/91 128/65   05/18/23 1300 05/18/23 1330 05/18/23 1430 05/18/23 1500  BP: 131/69 124/62 123/71 125/74   05/18/23 1515 05/18/23 1530 05/18/23 1730 05/18/23 1922  BP: 130/81 130/67 114/81 128/73  Reviewed med list and discussed with husband that tonight we will actually hold her diltiazem her torsemide her Aldactone and start her on low-dose IV fluids. Will follow patient's lactic acid level and hold her metformin as well.  Paroxysmal atrial fibrillation (HCC) Patient was sinus on auscultation, will continue patient's Eliquis. AKI (acute kidney injury) (HCC) Lab Results  Component Value Date   CREATININE 1.06 (H) 05/18/2023   CREATININE 1.03 04/12/2023   CREATININE 0.98 04/09/2023  Currently we will hold blood pressure medications monitor creatinine start with low rate maintenance IV fluid and follow-up.  Type 2 diabetes mellitus with other specified complication, with long-term current use of insulin (HCC) Also obtain a swallow evaluation, start patient on a carb consistent cardiac diet.  Manage patient on glycemic protocol.  Discussed with husband to discuss with her PCP and cardiology to either lower the dose of Jardiance or ideally discontinuing the probable recurrent UTIs secondary to her SGLT2 inhibitor.  Pt last A1c is 6.2.  Hypothyroidism Patient continued on levothyroxine 137 mcg p.o. every morning. (HFpEF) heart failure with preserved ejection fraction (HCC) Stable patient is clinically euvolemic. Patient's diuretic currently held secondary to elevated lactic acid although blood pressure is within normal limits. Clinically patient  appears dehydrated, and has dry mucous membranes. Strict I's and O's, daily weights. IDA (iron deficiency anemia)    Latest Ref Rng & Units 05/18/2023   10:30 AM 04/12/2023    2:11 PM 04/06/2023    2:17 AM  CBC  WBC 4.0 - 10.5 K/uL 7.7  5.6  6.8   Hemoglobin 12.0 - 15.0 g/dL 65.7  84.6  8.9   Hematocrit 36.0 - 46.0 % 35.8  34.7  28.7   Platelets 150 -  400 K/uL 178  181.0 Repeated and verified X2.  153   Patient's hemoglobin today is 10.9 is stable from her previous hemoglobin.  Will follow.  Patient's degree of microcytosis is severe and would recommend outpatient follow-up with hematology oncology, most recent MCV is 59.6.   Anxiety Patient is on Vistaril as needed.  Will start patient on as needed Ativan low-dose x 3. Sepsis secondary to UTI (HCC)  Intake/Output Summary (Last 24 hours) at 05/18/2023 1952 Last data filed at 05/18/2023 1744 Gross per 24 hour  Intake 750 ml  Output 450 ml  Net 300 ml    sodium chloride 75 mL/hr (05/18/23 1949)   [START ON 05/19/2023] cefTRIAXone (ROCEPHIN)  IV     Continue with MIVF. Hold bp meds/ diuretic and metformin.  History of stroke Patient continued on Eliquis.  Exam currently nonfocal. Lactic acidosis  Latest Reference Range & Units 05/18/23 10:40 05/18/23 13:12  Lactic Acid, Venous 0.5 - 1.9 mmol/L 2.7 (HH) 2.7 (HH)   Repeat level pending.  Due to combination of medication, sepsis  presentation. Will continue with low-grade maintenance IV fluid as patient has a history of CHF. Low back pain Towards later in evening pt all of sudden started c/o LBP . We will get MRI back. PM MD to follow.     DVT prophylaxis:  Eliquis  Consults:  None  Advance Care Planning:    Code Status: Full Code   Family Communication:  Spouse  Disposition Plan:  Home  Severity of Illness: The appropriate patient status for this patient is INPATIENT. Inpatient status is judged to be reasonable and necessary in order to provide the required  intensity of service to ensure the patient's safety. The patient's presenting symptoms, physical exam findings, and initial radiographic and laboratory data in the context of their chronic comorbidities is felt to place them at high risk for further clinical deterioration. Furthermore, it is not anticipated that the patient will be medically stable for discharge from the hospital within 2 midnights of admission.   * I certify that at the point of admission it is my clinical judgment that the patient will require inpatient hospital care spanning beyond 2 midnights from the point of admission due to high intensity of service, high risk for further deterioration and high frequency of surveillance required.*  Author: Gertha Calkin, MD 05/18/2023 7:52 PM  For on call review [email or web address].   Unresulted Labs (From admission, onward)     Start     Ordered   05/19/23 0500  Comprehensive metabolic panel  Tomorrow morning,   R        05/18/23 1747   05/19/23 0500  CBC  Tomorrow morning,   R        05/18/23 1747   05/18/23 1951  C-reactive protein  Add-on,   AD        05/18/23 1951   05/18/23 1943  Lipase, blood  Add-on,   AD        05/18/23 1942   05/18/23 1943  Lipid panel  Add-on,   AD        05/18/23 1942   05/18/23 1528  Culture, blood (routine x 2)  BLOOD CULTURE X 2,   R      05/18/23 1528   05/18/23 1140  Urine Culture  Once,   URGENT       Question:  Indication  Answer:  Suprapubic pain   05/18/23 1140  Orders Placed This Encounter  Procedures   Urine Culture   Culture, blood (routine x 2)   CT ABDOMEN PELVIS W CONTRAST   MR LUMBAR SPINE WO CONTRAST   Comprehensive metabolic panel   CBC with Differential   Urinalysis, w/ Reflex to Culture (Infection Suspected) -Urine, Clean Catch   Comprehensive metabolic panel   CBC   Magnesium   Lipase, blood   Lipid panel   C-reactive protein   Diet Carb Modified Fluid consistency: Thin; Room service appropriate? Yes    Insert foley catheter   Strict intake and output   Apply Diabetes Mellitus Care Plan   STAT CBG when hypoglycemia is suspected. If treated, recheck every 15 minutes after each treatment until CBG >/= 70 mg/dl   Refer to Hypoglycemia Protocol Sidebar Report for treatment of CBG < 70 mg/dl   No HS correction Insulin   Maintain IV access   Vital signs   Notify physician (specify)   Mobility Protocol: No Restrictions RN to initiate protocols based on patient's level of care   Refer to Sidebar Report Refer to ICU, Med-Surg, Progressive, and Step-Down Mobility Protocol Sidebars   Initiate Adult Central Line Maintenance and Catheter Protocol for patients with central line (CVC, PICC, Port, Hemodialysis, Trialysis)   Daily weights   Intake and Output   Do not place and if present remove PureWick   Initiate Oral Care Protocol   Initiate Carrier Fluid Protocol   RN may order General Admission PRN Orders utilizing "General Admission PRN medications" (through manage orders) for the following patient needs: allergy symptoms (Claritin), cold sores (Carmex), cough (Robitussin DM), eye irritation (Liquifilm Tears), hemorrhoids (Tucks), indigestion (Maalox), minor skin irritation (Hydrocortisone Cream), muscle pain Romeo Apple Gay), nose irritation (saline nasal spray) and sore throat (Chloraseptic spray).   Cardiac Monitoring - Continuous Indefinite   Swallow screen   Full code   Consult to hospitalist   Pulse oximetry check with vital signs   Oxygen therapy Mode or (Route): Nasal cannula; Liters Per Minute: 2; Keep O2 saturation between: greater than 92 %   I-Stat Lactic Acid, ED   I-Stat CG4 Lactic Acid   EKG 12-Lead   Admit to Inpatient (patient's expected length of stay will be greater than 2 midnights or inpatient only procedure)   Aspiration precautions   Fall precautions

## 2023-05-18 NOTE — Assessment & Plan Note (Addendum)
Lab Results  Component Value Date   CREATININE 1.06 (H) 05/18/2023   CREATININE 1.03 04/12/2023   CREATININE 0.98 04/09/2023  Currently we will hold blood pressure medications monitor creatinine start with low rate maintenance IV fluid and follow-up.

## 2023-05-18 NOTE — Assessment & Plan Note (Addendum)
Patient continued on levothyroxine 137 mcg p.o. every morning.

## 2023-05-18 NOTE — Progress Notes (Signed)
TRH night cross cover note:   I reviewed patient's EKG performed this evening. It demonstrates sinus tachycardia with a known LBBB, HR 95, less than 1 mm ST depression in leads 1 and avl which is unchanged compared to most recent prior EKG on 04/08/23, and demonstrates no acute T wave or ST changes, including no ST elevation.   I followed up on the patient's repeat lactic acid level, which improved to 1.5 from prior value of 2.7.  Of note, MRI L-spine ordered by admitting remains pending at this time.   Newton Pigg, DO Hospitalist

## 2023-05-18 NOTE — Assessment & Plan Note (Addendum)
Towards later in evening pt all of sudden started c/o LBP . We will get MRI back. PM MD to follow.

## 2023-05-18 NOTE — ED Notes (Signed)
2nd set of blood cultures not drawn, these were not ordered prior to placing orders for abx and receiving abx. 1st set of blood cultures were drawn prior to abx started

## 2023-05-18 NOTE — Assessment & Plan Note (Addendum)
Cultures collected in the emergency room will follow urine culture CNS.  Currently continued on Rocephin.  Based on previous cultures patient had E. coli that was pansensitive. Recent Results (from the past 720 hours)  Urine Culture     Status: Abnormal   Collection Time: 05/15/23 12:09 PM   Specimen: Urine  Result Value Ref Range Status   MICRO NUMBER: [id number]  Final   SPECIMEN QUALITY: Adequate  Final   Sample Source URINE  Final   STATUS: FINAL  Final   ISOLATE 1: Escherichia coli (A)  Final    Comment: Greater than 100,000 CFU/mL of Escherichia coli      Susceptibility   Escherichia coli - URINE CULTURE, REFLEX    AMOX/CLAVULANIC <=2 Sensitive     AMPICILLIN 4 Sensitive     AMPICILLIN/SULBACTAM <=2 Sensitive     CEFAZOLIN* <=4 Not Reportable      * For infections other than uncomplicated UTI caused by E. coli, K. pneumoniae or P. mirabilis: Cefazolin is resistant if MIC > or = 8 mcg/mL. (Distinguishing susceptible versus intermediate for isolates with MIC < or = 4 mcg/mL requires additional testing.) For uncomplicated UTI caused by E. coli, K. pneumoniae or P. mirabilis: Cefazolin is susceptible if MIC <32 mcg/mL and predicts susceptible to the oral agents cefaclor, cefdinir, cefpodoxime, cefprozil, cefuroxime, cephalexin and loracarbef.     CEFTAZIDIME <=1 Sensitive     CEFEPIME <=1 Sensitive     CEFTRIAXONE <=1 Sensitive     CIPROFLOXACIN >=4 Resistant     LEVOFLOXACIN >=8 Resistant     GENTAMICIN <=1 Sensitive     IMIPENEM <=0.25 Sensitive     NITROFURANTOIN <=16 Sensitive     PIP/TAZO <=4 Sensitive     TOBRAMYCIN <=1 Sensitive     TRIMETH/SULFA* <=20 Sensitive      * For infections other than uncomplicated UTI caused by E. coli, K. pneumoniae or P. mirabilis: Cefazolin is resistant if MIC > or = 8 mcg/mL. (Distinguishing susceptible versus intermediate for isolates with MIC < or = 4 mcg/mL requires additional testing.) For uncomplicated UTI caused by E.  coli, K. pneumoniae or P. mirabilis: Cefazolin is susceptible if MIC <32 mcg/mL and predicts susceptible to the oral agents cefaclor, cefdinir, cefpodoxime, cefprozil, cefuroxime, cephalexin and loracarbef. Legend: S = Susceptible  I = Intermediate R = Resistant  NS = Not susceptible SDD = Susceptible Dose Dependent * = Not Tested  NR = Not Reported **NN = See Therapy Comments

## 2023-05-19 DIAGNOSIS — N12 Tubulo-interstitial nephritis, not specified as acute or chronic: Secondary | ICD-10-CM | POA: Diagnosis not present

## 2023-05-19 DIAGNOSIS — I480 Paroxysmal atrial fibrillation: Secondary | ICD-10-CM

## 2023-05-19 DIAGNOSIS — I10 Essential (primary) hypertension: Secondary | ICD-10-CM | POA: Diagnosis not present

## 2023-05-19 LAB — CBC
HCT: 28.5 % — ABNORMAL LOW (ref 36.0–46.0)
Hemoglobin: 8.8 g/dL — ABNORMAL LOW (ref 12.0–15.0)
MCH: 18.4 pg — ABNORMAL LOW (ref 26.0–34.0)
MCHC: 30.9 g/dL (ref 30.0–36.0)
MCV: 59.5 fL — ABNORMAL LOW (ref 80.0–100.0)
Platelets: 128 10*3/uL — ABNORMAL LOW (ref 150–400)
RBC: 4.79 MIL/uL (ref 3.87–5.11)
RDW: 16.3 % — ABNORMAL HIGH (ref 11.5–15.5)
WBC: 5.5 10*3/uL (ref 4.0–10.5)
nRBC: 0 % (ref 0.0–0.2)

## 2023-05-19 LAB — COMPREHENSIVE METABOLIC PANEL
ALT: 18 U/L (ref 0–44)
AST: 22 U/L (ref 15–41)
Albumin: 3.3 g/dL — ABNORMAL LOW (ref 3.5–5.0)
Alkaline Phosphatase: 59 U/L (ref 38–126)
Anion gap: 9 (ref 5–15)
BUN: 21 mg/dL (ref 8–23)
CO2: 18 mmol/L — ABNORMAL LOW (ref 22–32)
Calcium: 8.9 mg/dL (ref 8.9–10.3)
Chloride: 109 mmol/L (ref 98–111)
Creatinine, Ser: 1.26 mg/dL — ABNORMAL HIGH (ref 0.44–1.00)
GFR, Estimated: 43 mL/min — ABNORMAL LOW (ref >60)
Glucose, Bld: 160 mg/dL — ABNORMAL HIGH (ref 70–99)
Potassium: 3.6 mmol/L (ref 3.5–5.1)
Sodium: 136 mmol/L (ref 135–145)
Total Bilirubin: 1.1 mg/dL (ref 0.0–1.2)
Total Protein: 6.1 g/dL — ABNORMAL LOW (ref 6.5–8.1)

## 2023-05-19 LAB — LIPID PANEL
Cholesterol: 132 mg/dL (ref 0–200)
HDL: 33 mg/dL — ABNORMAL LOW (ref >40)
LDL Cholesterol: 74 mg/dL (ref 0–99)
Total CHOL/HDL Ratio: 4 {ratio}
Triglycerides: 124 mg/dL (ref <150)
VLDL: 25 mg/dL (ref 0–40)

## 2023-05-19 LAB — TROPONIN I (HIGH SENSITIVITY)
Troponin I (High Sensitivity): 57 ng/L — ABNORMAL HIGH (ref <18)
Troponin I (High Sensitivity): 59 ng/L — ABNORMAL HIGH (ref <18)

## 2023-05-19 LAB — C-REACTIVE PROTEIN: CRP: 0.5 mg/dL (ref <1.0)

## 2023-05-19 LAB — LIPASE, BLOOD: Lipase: 18 U/L (ref 11–51)

## 2023-05-19 LAB — GLUCOSE, CAPILLARY
Glucose-Capillary: 142 mg/dL — ABNORMAL HIGH (ref 70–99)
Glucose-Capillary: 140 mg/dL — ABNORMAL HIGH (ref 70–99)
Glucose-Capillary: 135 mg/dL — ABNORMAL HIGH (ref 70–99)
Glucose-Capillary: 146 mg/dL — ABNORMAL HIGH (ref 70–99)

## 2023-05-19 MED ORDER — CHLORHEXIDINE GLUCONATE CLOTH 2 % EX PADS
6.0000 | MEDICATED_PAD | Freq: Every day | CUTANEOUS | Status: DC
  Administered 2023-05-19 – 2023-05-21 (×2): 6 via TOPICAL

## 2023-05-19 MED ORDER — BISACODYL 10 MG RE SUPP
10.0000 mg | Freq: Every day | RECTAL | Status: DC | PRN
  Administered 2023-05-19: 10 mg via RECTAL
  Filled 2023-05-19: qty 1

## 2023-05-19 MED ORDER — PHENAZOPYRIDINE HCL 200 MG PO TABS
200.0000 mg | ORAL_TABLET | Freq: Three times a day (TID) | ORAL | Status: DC
  Administered 2023-05-19 – 2023-05-21 (×5): 200 mg via ORAL
  Filled 2023-05-19 (×7): qty 1

## 2023-05-19 MED ORDER — ASPIRIN 81 MG PO CHEW
324.0000 mg | CHEWABLE_TABLET | Freq: Once | ORAL | Status: DC
  Filled 2023-05-19 (×2): qty 4

## 2023-05-19 MED ORDER — AMITRIPTYLINE HCL 25 MG PO TABS
25.0000 mg | ORAL_TABLET | Freq: Every day | ORAL | Status: DC
  Administered 2023-05-19 – 2023-05-20 (×2): 25 mg via ORAL
  Filled 2023-05-19 (×3): qty 1

## 2023-05-19 MED ORDER — HYDROCODONE-ACETAMINOPHEN 5-325 MG PO TABS
1.0000 | ORAL_TABLET | Freq: Four times a day (QID) | ORAL | Status: DC | PRN
  Administered 2023-05-19 – 2023-05-21 (×3): 1 via ORAL
  Filled 2023-05-19 (×5): qty 1

## 2023-05-19 MED ORDER — POLYETHYLENE GLYCOL 3350 17 G PO PACK
17.0000 g | PACK | Freq: Every day | ORAL | Status: DC
  Administered 2023-05-19 – 2023-05-20 (×2): 17 g via ORAL
  Filled 2023-05-19 (×2): qty 1

## 2023-05-19 MED ORDER — SMOG ENEMA
960.0000 mL | Freq: Once | RECTAL | Status: AC
Start: 2023-05-19 — End: 2023-05-19
  Administered 2023-05-19: 960 mL via RECTAL
  Filled 2023-05-19: qty 960

## 2023-05-19 MED ORDER — MAGNESIUM CITRATE PO SOLN
0.5000 | Freq: Once | ORAL | Status: AC
  Administered 2023-05-19: 0.5 via ORAL
  Filled 2023-05-19: qty 296

## 2023-05-19 MED ORDER — ONDANSETRON HCL 4 MG/2ML IJ SOLN
4.0000 mg | Freq: Four times a day (QID) | INTRAMUSCULAR | Status: DC | PRN
  Administered 2023-05-19 – 2023-05-20 (×3): 4 mg via INTRAVENOUS
  Filled 2023-05-19 (×3): qty 2

## 2023-05-19 MED ORDER — DILTIAZEM HCL 30 MG PO TABS
30.0000 mg | ORAL_TABLET | Freq: Four times a day (QID) | ORAL | Status: DC
  Administered 2023-05-19 – 2023-05-20 (×5): 30 mg via ORAL
  Filled 2023-05-19 (×5): qty 1

## 2023-05-19 MED ORDER — NITROGLYCERIN 0.4 MG SL SUBL
0.4000 mg | SUBLINGUAL_TABLET | SUBLINGUAL | Status: DC | PRN
  Filled 2023-05-19: qty 1

## 2023-05-19 MED ORDER — HYDROMORPHONE HCL 1 MG/ML IJ SOLN: 0.5000 mg | INTRAMUSCULAR | Status: DC | PRN

## 2023-05-19 NOTE — Progress Notes (Signed)
After speaking with MRI for scheduling I interacted with PT who earlier reports had refused MRI. PT again stated she "can't have MRIs period". When I inquired re: any exclusion such as metal or incomparable pacemaker she replied "I freak out and have panic attack". She did have PO Ativan at 0538 today. I asked if additional dose would help and she replied "no". Dr. Randol Kern messaged and I spoke with MRI staff about her refusal.

## 2023-05-19 NOTE — Progress Notes (Signed)
PROGRESS NOTE    Breanna Webster  WGN:[id number] DOB: Dec 29, 1945 DOA: 05/18/2023 PCP: Shirline Frees, NP   Chief Complaint  Patient presents with   Urinary Tract Infection    Brief Narrative:   ill Breanna Webster is a 78 y.o. female with medical history significant of history of atrial fibrillation with RVR, prior strep bacteremia, CAD with stent to RCA in 2019, carotid artery stenosis with history of acute cerebellar stroke in 2020 and an embolic right posterior cerebral artery stroke in 2022, HFpEF, diverticulosis with prior diverticulitis, GERD, Graves' disease with posttreatment hypothyroidism, total shoulder replacement on the left, dyslipidemia, hypertension, parkinsonian symptoms, status post TAVR, type 2 diabetes.  -Patient presents to ED secondary to complaints of respiratory symptoms, patient was recently started on Augmentin and ciprofloxacin which she took p.o. doses x 2, with no improvement of her symptoms, and currently reporting flank pain as well which prompted her to come to ED.  Assessment & Plan:   Principal Problem:   Pyelonephritis Active Problems:   Essential hypertension   Paroxysmal atrial fibrillation (HCC)   AKI (acute kidney injury) (HCC)   Type 2 diabetes mellitus with other specified complication, with long-term current use of insulin (HCC)   Hypothyroidism   (HFpEF) heart failure with preserved ejection fraction (HCC)   IDA (iron deficiency anemia)   Anxiety   Sepsis secondary to UTI (HCC)   History of stroke   Low back pain   Lactic acidosis  Sepsis, present on admission, secondary to Breanna. coli UTI with  pyelonephritis -Lactic acid significantly elevated, concern for sepsis -Is having CVA tenderness-flank pain -Continue with IV Rocephin given recent culture data showing pansensitive Breanna. coli -Continue with IV fluids with -Lactic acidosis has resolved - will need to hold her SGL 2  Paroxysmal atrial fibrillation (HCC) -Continue with Eliquis for  anticoagulation -Blood pressure remains soft, she is on Cardizem CD, I will keep her on Cardizem 30 mg every 6 hours scheduled for now and transition to Cardizem CD once blood pressure has improved.  AKI (acute kidney injury) (HCC) -Blood pressure remains soft, continue with IV fluids and hold antihypertensive medications  Type 2 diabetes mellitus with other specified complication, with long-term current use of insulin (HCC) -Will hold SGLT2 in the setting of UTI -monitor CBG  History of CVA -Continue with Eliquis  Lower back pain -Chronic, no red flag signs, good lower extremity strength -Patient reports she is significantly claustrophobic, she declined MRI despite offer of an extra dose of Ativan  Hospital delirium -She is significantly altered and confused this morning, she received multiple doses of Ativan and Dilaudid-fentanyl over last 24 hours, will discontinue all of that  Hypothyroidism Patient continued on levothyroxine 137 mcg p.o. every morning.  Chronic heart failure with preserved ejection fraction (HCC) Stable patient is clinically euvolemic. Patient's diuretic currently held secondary to elevated lactic acid although blood pressure is within normal limits. Clinically patient appears dehydrated, and has dry mucous membranes. Strict I's and O's, daily weights.  Essential hypertension Blood pressure has been stable with systolic in the 120 mmHg range. Plan to continue spironolactone. Patient will benefit from ARB or Ace inh as outpatient if blood pressure stable.    Paroxysmal atrial fibrillation (HCC) Continue rate control with diltiazem and anticoagulation with apixaban.    AKI (acute kidney injury) (HCC) Hyponatremia. Hypokalemia.    Parkinsonism (HCC) Consult PT and OT  Depression Continue with amitriptyline and tizanidine.       DVT prophylaxis: Eliquis Code Status:  Full code Family Communication: DNR Disposition:   Status is: Inpatient     Consultants:  None  Subjective:  Patient has significant anxiety overnight, has been complaining of pain as well, but resolved this morning,  Objective: Vitals:   05/19/23 0400 05/19/23 0540 05/19/23 0800 05/19/23 1252  BP: 110/61 (!) 128/99 113/62 110/60  Pulse: 86 (!) 111 94 89  Resp: 15  18 18   Temp:   97.8 F (36.6 C) 98.2 F (36.8 C)  TempSrc:   Axillary Oral  SpO2: 97% 96% 97% 96%  Weight:      Height:        Intake/Output Summary (Last 24 hours) at 05/19/2023 1404 Last data filed at 05/19/2023 0436 Gross per 24 hour  Intake 694.24 ml  Output 725 ml  Net -30.76 ml   Filed Weights   05/18/23 1119  Weight: 74.8 kg    Examination:  Awake, confused, anxious this morning, oriented x 1, has improved without conversation and through the day as well Symmetrical Chest wall movement, Good air movement bilaterally, CTAB RRR,No Gallops,Rubs or new Murmurs, No Parasternal Heave +ve B.Sounds, Abd Soft, mild bilateral flank tenderness to palpation No Cyanosis, Clubbing or edema, No new Rash or bruise       Data Reviewed: I have personally reviewed following labs and imaging studies  CBC: Recent Labs  Lab 05/18/23 1030 05/19/23 0743  WBC 7.7 5.5  NEUTROABS 6.4  --   HGB 10.9* 8.8*  HCT 35.8* 28.5*  MCV 59.6* 59.5*  PLT 178 128*    Basic Metabolic Panel: Recent Labs  Lab 05/18/23 1030 05/19/23 0743  NA 134* 136  K 3.6 3.6  CL 104 109  CO2 17* 18*  GLUCOSE 159* 160*  BUN 26* 21  CREATININE 1.06* 1.26*  CALCIUM 10.3 8.9  MG 1.9  --     GFR: Estimated Creatinine Clearance: 36.6 mL/min (A) (by C-G formula based on SCr of 1.26 mg/dL (H)).  Liver Function Tests: Recent Labs  Lab 05/18/23 1030 05/19/23 0743  AST 30 22  ALT 23 18  ALKPHOS 61 59  BILITOT 1.1 1.1  PROT 8.0 6.1*  ALBUMIN 3.9 3.3*    CBG: Recent Labs  Lab 05/18/23 1953 05/19/23 0807 05/19/23 1250  GLUCAP 151* 142* 140*     Recent Results (from the past 240 hours)  Urine  Culture     Status: Abnormal   Collection Time: 05/15/23 12:09 PM   Specimen: Urine  Result Value Ref Range Status   MICRO NUMBER: [id number]  Final   SPECIMEN QUALITY: Adequate  Final   Sample Source URINE  Final   STATUS: FINAL  Final   ISOLATE 1: Escherichia coli (A)  Final    Comment: Greater than 100,000 CFU/mL of Escherichia coli      Susceptibility   Escherichia coli - URINE CULTURE, REFLEX    AMOX/CLAVULANIC <=2 Sensitive     AMPICILLIN 4 Sensitive     AMPICILLIN/SULBACTAM <=2 Sensitive     CEFAZOLIN* <=4 Not Reportable      * For infections other than uncomplicated UTI caused by Breanna. coli, K. pneumoniae or P. mirabilis: Cefazolin is resistant if MIC > or = 8 mcg/mL. (Distinguishing susceptible versus intermediate for isolates with MIC < or = 4 mcg/mL requires additional testing.) For uncomplicated UTI caused by Breanna. coli, K. pneumoniae or P. mirabilis: Cefazolin is susceptible if MIC <32 mcg/mL and predicts susceptible to the oral agents cefaclor, cefdinir, cefpodoxime, cefprozil, cefuroxime, cephalexin and loracarbef.  CEFTAZIDIME <=1 Sensitive     CEFEPIME <=1 Sensitive     CEFTRIAXONE <=1 Sensitive     CIPROFLOXACIN >=4 Resistant     LEVOFLOXACIN >=8 Resistant     GENTAMICIN <=1 Sensitive     IMIPENEM <=0.25 Sensitive     NITROFURANTOIN <=16 Sensitive     PIP/TAZO <=4 Sensitive     TOBRAMYCIN <=1 Sensitive     TRIMETH/SULFA* <=20 Sensitive      * For infections other than uncomplicated UTI caused by Breanna. coli, K. pneumoniae or P. mirabilis: Cefazolin is resistant if MIC > or = 8 mcg/mL. (Distinguishing susceptible versus intermediate for isolates with MIC < or = 4 mcg/mL requires additional testing.) For uncomplicated UTI caused by Breanna. coli, K. pneumoniae or P. mirabilis: Cefazolin is susceptible if MIC <32 mcg/mL and predicts susceptible to the oral agents cefaclor, cefdinir, cefpodoxime, cefprozil, cefuroxime, cephalexin and loracarbef. Legend: S =  Susceptible  I = Intermediate R = Resistant  NS = Not susceptible SDD = Susceptible Dose Dependent * = Not Tested  NR = Not Reported **NN = See Therapy Comments   Culture, blood (routine x 2)     Status: None (Preliminary result)   Collection Time: 05/18/23  3:28 PM   Specimen: BLOOD RIGHT FOREARM  Result Value Ref Range Status   Specimen Description BLOOD RIGHT FOREARM  Final   Special Requests   Final    BOTTLES DRAWN AEROBIC AND ANAEROBIC Blood Culture results may not be optimal due to an inadequate volume of blood received in culture bottles   Culture   Final    NO GROWTH < 24 HOURS Performed at [facility], [address] [place], [place] [address]    Report Status PENDING  Incomplete  Culture, blood (routine x 2)     Status: None (Preliminary result)   Collection Time: 05/18/23  8:08 PM   Specimen: BLOOD  Result Value Ref Range Status   Specimen Description BLOOD SITE NOT SPECIFIED  Final   Special Requests   Final    BOTTLES DRAWN AEROBIC AND ANAEROBIC Blood Culture results may not be optimal due to an inadequate volume of blood received in culture bottles   Culture   Final    NO GROWTH < 12 HOURS Performed at [facility], [address] [place], [place] [address]    Report Status PENDING  Incomplete         Radiology Studies: CT ABDOMEN PELVIS W CONTRAST Result Date: 05/18/2023 CLINICAL DATA:  Severe abdominal and flank pain. Nausea. Urinary tract infection. Constipation. EXAM: CT ABDOMEN AND PELVIS WITH CONTRAST TECHNIQUE: Multidetector CT imaging of the abdomen and pelvis was performed using the standard protocol following bolus administration of intravenous contrast. RADIATION DOSE REDUCTION: This exam was performed according to the departmental dose-optimization program which includes automated exposure control, adjustment of the mA and/or kV according to patient size and/or use of iterative reconstruction technique. CONTRAST:  75mL OMNIPAQUE IOHEXOL  350 MG/ML SOLN COMPARISON:  03/25/2023 FINDINGS: Lower Chest: No acute findings. Hepatobiliary: No suspicious hepatic masses identified. Mild capsular nodularity is suspicious for cirrhosis. Prior cholecystectomy. No evidence of biliary obstruction. Pancreas: No mass or inflammatory changes. Diffuse fatty replacement again noted. Spleen: Within normal limits in size and appearance. Adrenals/Urinary Tract: No suspicious masses identified. No evidence of ureteral calculi or hydronephrosis. Foley catheter seen in the bladder which is empty. Stomach/Bowel: No evidence of obstruction, inflammatory process or abnormal fluid collections. Normal appendix visualized. Diffuse colonic  diverticulosis again seen, without signs of diverticulitis. Vascular/Lymphatic: No pathologically enlarged lymph nodes. No acute vascular findings. Reproductive: 2 cm left posterior uterine fibroid again seen. Adnexal regions are unremarkable. Other:  None. Musculoskeletal:  No suspicious bone lesions identified. IMPRESSION: No acute findings. Colonic diverticulosis, without radiographic evidence of diverticulitis. Small uterine fibroid. Probable hepatic cirrhosis. Electronically Signed   By: Danae Orleans M.D.   On: 05/18/2023 14:28        Scheduled Meds:  allopurinol  100 mg Oral Daily   amitriptyline  25 mg Oral QHS   apixaban  5 mg Oral BID   Chlorhexidine Gluconate Cloth  6 each Topical Daily   diltiazem  30 mg Oral Q6H   insulin aspart  0-15 Units Subcutaneous TID WC   levothyroxine  137 mcg Oral q morning   mirabegron ER  25 mg Oral Daily   simethicone  80 mg Oral Once   sodium chloride flush  3 mL Intravenous Q12H   Continuous Infusions:  sodium chloride 75 mL/hr at 05/19/23 0912   cefTRIAXone (ROCEPHIN)  IV       LOS: 1 day       Huey Bienenstock, MD [facility]   To contact the attending provider between 7A-7P or the covering provider during after hours 7P-7A, please log into the web site  [email or web address] and access using universal [place] password for that web site. If you do not have the password, please call the hospital operator.  05/19/2023, 2:04 PM

## 2023-05-19 NOTE — Progress Notes (Signed)
PT c/o left sided chest pressure that started ~ 1730 but didn't inform any staff until ~ 30 minutes later. I communicated with Dr. Ronny Flurry. STAT EKG and Troponin were ordered. I obtained 12 lead which showed "Normal Sinus rhythm, Left bundle branch block, Abnormal EKG." Bedside monitor is alarming for ST elevation in III and aVF. PT rated chest pressure 10/10 located left upper chest into left shoulder. PT has been having episodes of nausea but we are treating constipation at this time also which may or may not be related. PT remained at current level of care and troponin still awaiting to be collected. Will contact lab/phlebotomy.

## 2023-05-19 NOTE — Progress Notes (Signed)
TRH night cross cover note:  Prn IV Zofran added for nausea.    Newton Pigg, DO Hospitalist

## 2023-05-19 NOTE — Progress Notes (Signed)
PT still experiencing active left sided chest pain. 12 lead EKG obtained. MD ordered STAT 324mg  ASA PO and 0.4mg  SL NTG. PT refused both meds. She refused ASA as her husband told her she can't take ASA while on plavix. She also refused NTG as her Dr told her to only take NTG when she is having a heart attack. I spoke on phone with Dr. Randol Kern. Awaiting Troponin to be drawn. Called phlebotomy again to have troponin drawn.

## 2023-05-20 DIAGNOSIS — N12 Tubulo-interstitial nephritis, not specified as acute or chronic: Secondary | ICD-10-CM | POA: Diagnosis not present

## 2023-05-20 LAB — HEMOGLOBIN A1C
Hgb A1c MFr Bld: 6.7 % — ABNORMAL HIGH (ref 4.8–5.6)
Mean Plasma Glucose: 145.59 mg/dL

## 2023-05-20 LAB — CBC
HCT: 29.9 % — ABNORMAL LOW (ref 36.0–46.0)
Hemoglobin: 9.3 g/dL — ABNORMAL LOW (ref 12.0–15.0)
MCH: 18.2 pg — ABNORMAL LOW (ref 26.0–34.0)
MCHC: 31.1 g/dL (ref 30.0–36.0)
MCV: 58.4 fL — ABNORMAL LOW (ref 80.0–100.0)
Platelets: 138 10*3/uL — ABNORMAL LOW (ref 150–400)
RBC: 5.12 MIL/uL — ABNORMAL HIGH (ref 3.87–5.11)
RDW: 16.5 % — ABNORMAL HIGH (ref 11.5–15.5)
WBC: 6.5 10*3/uL (ref 4.0–10.5)
nRBC: 0 % (ref 0.0–0.2)

## 2023-05-20 LAB — BASIC METABOLIC PANEL
Anion gap: 9 (ref 5–15)
BUN: 13 mg/dL (ref 8–23)
CO2: 19 mmol/L — ABNORMAL LOW (ref 22–32)
Calcium: 9.1 mg/dL (ref 8.9–10.3)
Chloride: 109 mmol/L (ref 98–111)
Creatinine, Ser: 0.79 mg/dL (ref 0.44–1.00)
GFR, Estimated: >60 mL/min (ref >60)
Glucose, Bld: 179 mg/dL — ABNORMAL HIGH (ref 70–99)
Potassium: 4.1 mmol/L (ref 3.5–5.1)
Sodium: 137 mmol/L (ref 135–145)

## 2023-05-20 LAB — GLUCOSE, CAPILLARY
Glucose-Capillary: 144 mg/dL — ABNORMAL HIGH (ref 70–99)
Glucose-Capillary: 171 mg/dL — ABNORMAL HIGH (ref 70–99)
Glucose-Capillary: 193 mg/dL — ABNORMAL HIGH (ref 70–99)

## 2023-05-20 MED ORDER — HYDROMORPHONE HCL 1 MG/ML IJ SOLN
0.5000 mg | INTRAMUSCULAR | Status: DC | PRN
  Administered 2023-05-20: 0.5 mg via INTRAVENOUS
  Filled 2023-05-20: qty 0.5

## 2023-05-20 MED ORDER — OXYCODONE HCL 5 MG PO TABS
5.0000 mg | ORAL_TABLET | Freq: Four times a day (QID) | ORAL | Status: DC | PRN
  Administered 2023-05-20: 5 mg via ORAL
  Filled 2023-05-20: qty 1

## 2023-05-20 MED ORDER — BISACODYL 5 MG PO TBEC
10.0000 mg | DELAYED_RELEASE_TABLET | Freq: Once | ORAL | Status: AC
  Administered 2023-05-20: 10 mg via ORAL
  Filled 2023-05-20: qty 2

## 2023-05-20 MED ORDER — HYDROXYZINE HCL 25 MG PO TABS
25.0000 mg | ORAL_TABLET | Freq: Three times a day (TID) | ORAL | Status: DC | PRN
  Administered 2023-05-20 (×2): 25 mg via ORAL
  Filled 2023-05-20 (×2): qty 1

## 2023-05-20 MED ORDER — LORAZEPAM 2 MG/ML IJ SOLN
0.5000 mg | Freq: Once | INTRAMUSCULAR | Status: AC | PRN
  Administered 2023-05-20: 0.5 mg via INTRAVENOUS
  Filled 2023-05-20: qty 1

## 2023-05-20 MED ORDER — DILTIAZEM HCL ER COATED BEADS 120 MG PO CP24
120.0000 mg | ORAL_CAPSULE | Freq: Every day | ORAL | Status: DC
  Administered 2023-05-21: 120 mg via ORAL
  Filled 2023-05-20: qty 1

## 2023-05-20 MED ORDER — DILTIAZEM HCL ER COATED BEADS 120 MG PO CP24: 120.0000 mg | ORAL_CAPSULE | Freq: Every day | ORAL | Status: DC

## 2023-05-20 MED ORDER — DILTIAZEM HCL 30 MG PO TABS
30.0000 mg | ORAL_TABLET | Freq: Four times a day (QID) | ORAL | Status: AC
  Administered 2023-05-20 (×2): 30 mg via ORAL
  Filled 2023-05-20 (×2): qty 1

## 2023-05-20 MED ORDER — ALPRAZOLAM 0.25 MG PO TABS
0.2500 mg | ORAL_TABLET | Freq: Three times a day (TID) | ORAL | Status: DC | PRN
  Administered 2023-05-20 – 2023-05-21 (×2): 0.25 mg via ORAL
  Filled 2023-05-20 (×2): qty 1

## 2023-05-20 MED ORDER — POLYETHYLENE GLYCOL 3350 17 G PO PACK
17.0000 g | PACK | Freq: Two times a day (BID) | ORAL | Status: DC
  Administered 2023-05-20 – 2023-05-21 (×2): 17 g via ORAL
  Filled 2023-05-20 (×2): qty 1

## 2023-05-20 NOTE — Evaluation (Signed)
Physical Therapy Evaluation Patient Details Name: Breanna Webster MRN: [id number] DOB: 05/17/1945 Today's Date: 05/20/2023  History of Present Illness  78 y/o F presenting to ED on 1/25 after experiencing respiratory symptoms after starting Augmentin and flank pain. Admitted for sepsis 2/2 UTI with pyelonephritis.  PMH includes anxiety, arthritis, A fib RVR, back pain, CAD, cerebellar stroke, CVA, chest pain, CHF, low back pain, GERD, grave's disease, L shoulder replecement, HTN, HLD, TAVR, IBS, parkinson's syndrome  Clinical Impression  Pt presents with admitting diagnosis above. Pt today was able to ambulate around room with Min A HHA. PTA pt reports she uses a [facility] and her husband helps out a lot with ADLs and mobility. Recommend HHPT upon DC. PT will continue to follow.         If plan is discharge home, recommend the following: A little help with walking and/or transfers;A little help with bathing/dressing/bathroom;Assistance with cooking/housework;Direct supervision/assist for medications management;Assist for transportation;Help with stairs or ramp for entrance   Can travel by private vehicle        Equipment Recommendations None recommended by PT  Recommendations for Other Services       Functional Status Assessment Patient has had a recent decline in their functional status and demonstrates the ability to make significant improvements in function in a reasonable and predictable amount of time.     Precautions / Restrictions Precautions Precautions: Fall Restrictions Weight Bearing Restrictions Per Provider Order: No      Mobility  Bed Mobility               General bed mobility comments: Pt up in chair    Transfers Overall transfer level: Needs assistance Equipment used: 1 person hand held assist Transfers: Sit to/from Stand, Bed to chair/wheelchair/BSC Sit to Stand: Min assist Stand pivot transfers: Min assist         General transfer comment: Pt uses  SPC at baseline however cane not in room so opted for HHA    Ambulation/Gait Ambulation/Gait assistance: Min assist Gait Distance (Feet): 25 Feet Assistive device: 1 person hand held assist Gait Pattern/deviations: Narrow base of support, Shuffle, Decreased stride length, Step-through pattern Gait velocity: decreased     General Gait Details: Pt able to ambulate to door and back however noted to reach for objects out of BOS with non HHA hand. Pt easily distracted and requires constant cues to stay on task.  Stairs            Wheelchair Mobility     Tilt Bed    Modified Rankin (Stroke Patients Only)       Balance Overall balance assessment: Needs assistance Sitting-balance support: Feet supported Sitting balance-Leahy Scale: Good     Standing balance support: During functional activity Standing balance-Leahy Scale: Fair Standing balance comment: external support for dynamic standing                             Pertinent Vitals/Pain Pain Assessment Pain Assessment: Faces Faces Pain Scale: Hurts little more Pain Location: neck and back Pain Descriptors / Indicators: Discomfort Pain Intervention(s): Monitored during session, Limited activity within patient's tolerance, RN gave pain meds during session    Home Living Family/patient expects to be discharged to:: Private residence Living Arrangements: Spouse/significant other Available Help at Discharge: Family;Available 24 hours/day Type of Home: House Home Access: Level entry       Home Layout: One level Home Equipment: Cane - single Librarian, academic (  2 wheels);Shower seat;Wheelchair - manual Additional Comments: Pulled from OT note    Prior Function Prior Level of Function : Needs assist;Driving             Mobility Comments: uses cane in and outside the home also has assist from spouse ADLs Comments: reports ind but spouse assists as needed     Extremity/Trunk Assessment   Upper  Extremity Assessment Upper Extremity Assessment: Generalized weakness    Lower Extremity Assessment Lower Extremity Assessment: Generalized weakness    Cervical / Trunk Assessment Cervical / Trunk Assessment: Normal  Communication   Communication Communication: No apparent difficulties  Cognition Arousal: Suspect due to medications Behavior During Therapy: Anxious Overall Cognitive Status: No family/caregiver present to determine baseline cognitive functioning                                 General Comments: pt needs cues to recall why she is in the hospital, seemingly distracted by neck/back pain and that she did not sleep last night. Per RN pt also got ?Ativan last night        General Comments General comments (skin integrity, edema, etc.): VSS    Exercises     Assessment/Plan    PT Assessment Patient needs continued PT services  PT Problem List Decreased strength;Decreased range of motion;Decreased activity tolerance;Decreased balance;Decreased mobility;Decreased coordination;Decreased cognition;Decreased knowledge of use of DME;Decreased safety awareness;Decreased knowledge of precautions;Cardiopulmonary status limiting activity       PT Treatment Interventions DME instruction;Gait training;Stair training;Functional mobility training;Therapeutic activities;Therapeutic exercise;Balance training;Neuromuscular re-education;Cognitive remediation;Patient/family education    PT Goals (Current goals can be found in the Care Plan section)  Acute Rehab PT Goals Patient Stated Goal: to go home PT Goal Formulation: With patient Time For Goal Achievement: 06/03/23 Potential to Achieve Goals: Good    Frequency Min 1X/week     Co-evaluation               AM-PAC PT "6 Clicks" Mobility  Outcome Measure Help needed turning from your back to your side while in a flat bed without using bedrails?: A Little Help needed moving from lying on your back to  sitting on the side of a flat bed without using bedrails?: A Little Help needed moving to and from a bed to a chair (including a wheelchair)?: A Little Help needed standing up from a chair using your arms (e.g., wheelchair or bedside chair)?: A Little Help needed to walk in hospital room?: A Little Help needed climbing 3-5 steps with a railing? : A Little 6 Click Score: 18    End of Session Equipment Utilized During Treatment: Gait belt Activity Tolerance: Patient tolerated treatment well Patient left: Other (comment);with nursing/sitter in room (On [facility] with nursing staff) Nurse Communication: Mobility status PT Visit Diagnosis: Other abnormalities of gait and mobility (R26.89)    Time: 1610-9604 PT Time Calculation (min) (ACUTE ONLY): 9 min   Charges:   PT Evaluation $PT Eval Moderate Complexity: 1 Mod   PT General Charges $$ ACUTE PT VISIT: 1 Visit         Shela Nevin, PT, DPT Acute Rehab Services [phone]   Gladys Damme 05/20/2023, 12:40 PM

## 2023-05-20 NOTE — Evaluation (Signed)
Occupational Therapy Evaluation Patient Details Name: Breanna Webster MRN: [id number] DOB: 10/11/1945 Today's Date: 05/20/2023   History of Present Illness 78 y/o F presenting to ED on 1/25 after experiencing respiratory symptoms after starting Augmentin and flank pain. Admitted for sepsis 2/2 UTI with pyelonephritis.  PMH includes anxiety, arthritis, A fib RVR, back pain, CAD, cerebellar stroke, CVA, chest pain, CHF, low back pain, GERD, grave's disease, L shoulder replecement, HTN, HLD, TAVR, IBS, parkinson's syndrome   Clinical Impression   Pt reports using a cane at baseline to ambulate short distances at home, has assist PRN from spouse for ADLs, spouse is home all the time and can assist. Pt needing up to mod A for ADLs, min A for bed mobility and min A for pivot transfer to/from [facility] with 1 person HHA. Pt reports feeling very sleepy, keeps eyes closed frequently during session, states she did not sleep well last night. Pt presenting with impairments listed below, will follow acutely. Recommend HHOT at d/c given spouse can provide level of assist needed at home.       If plan is discharge home, recommend the following: A little help with walking and/or transfers;A little help with bathing/dressing/bathroom;Assist for transportation;Supervision due to cognitive status;Help with stairs or ramp for entrance;Assistance with cooking/housework    Functional Status Assessment  Patient has had a recent decline in their functional status and demonstrates the ability to make significant improvements in function in a reasonable and predictable amount of time.  Equipment Recommendations  BSC/3in1    Recommendations for Other Services PT consult     Precautions / Restrictions Precautions Precautions: Fall Restrictions Weight Bearing Restrictions Per Provider Order: No      Mobility Bed Mobility Overal bed mobility: Needs Assistance Bed Mobility: Supine to Sit     Supine to sit: Min  assist     General bed mobility comments: to scoot to EOB/feet on floor    Transfers Overall transfer level: Needs assistance Equipment used: 1 person hand held assist Transfers: Sit to/from Stand, Bed to chair/wheelchair/BSC Sit to Stand: Min assist Stand pivot transfers: Min assist                Balance Overall balance assessment: Needs assistance Sitting-balance support: Feet supported Sitting balance-Leahy Scale: Good     Standing balance support: During functional activity Standing balance-Leahy Scale: Fair Standing balance comment: external support for dynamic standing                           ADL either performed or assessed with clinical judgement   ADL Overall ADL's : Needs assistance/impaired Eating/Feeding: Set up   Grooming: Set up;Sitting   Upper Body Bathing: Minimal assistance;Sitting   Lower Body Bathing: Moderate assistance;Sitting/lateral leans   Upper Body Dressing : Minimal assistance;Sitting   Lower Body Dressing: Moderate assistance;Sitting/lateral leans   Toilet Transfer: Minimal assistance;Stand-pivot;Squat-pivot;BSC/3in1 Statistician Details (indicate cue type and reason): pivot to [facility] with 1 person HHA Toileting- Clothing Manipulation and Hygiene: Supervision/safety       Functional mobility during ADLs: Minimal assistance       Vision   Vision Assessment?: No apparent visual deficits     Perception Perception: Not tested       Praxis Praxis: Not tested       Pertinent Vitals/Pain Pain Assessment Pain Assessment: Faces Pain Score: 4  Faces Pain Scale: Hurts little more Pain Location: neck and back Pain Descriptors / Indicators: Discomfort Pain  Intervention(s): Limited activity within patient's tolerance, Monitored during session, Repositioned     Extremity/Trunk Assessment Upper Extremity Assessment Upper Extremity Assessment: Generalized weakness   Lower Extremity Assessment Lower Extremity  Assessment: Defer to PT evaluation   Cervical / Trunk Assessment Cervical / Trunk Assessment: Normal   Communication Communication Communication: No apparent difficulties   Cognition Arousal: Suspect due to medications Behavior During Therapy: WFL for tasks assessed/performed Overall Cognitive Status: No family/caregiver present to determine baseline cognitive functioning                                 General Comments: pt needs cues to recall why she is in the hospital, seemingly distracted by neck/back pain and that she did not sleep last night. Per RN pt also got ?Ativan last night     General Comments  VSS on RA    Exercises     Shoulder Instructions      Home Living Family/patient expects to be discharged to:: Private residence Living Arrangements: Spouse/significant other Available Help at Discharge: Family;Available 24 hours/day Type of Home: House Home Access: Level entry     Home Layout: One level     Bathroom Shower/Tub: Producer, television/film/video: Standard     Home Equipment: Cane - single Librarian, academic (2 wheels);Shower seat;Wheelchair - manual          Prior Functioning/Environment Prior Level of Function : Needs assist;Driving             Mobility Comments: uses cane in and outside the home also has assist from spouse ADLs Comments: reports ind but spouse assists as needed        OT Problem List: Decreased strength;Decreased range of motion;Decreased activity tolerance;Impaired balance (sitting and/or standing);Decreased cognition;Decreased safety awareness      OT Treatment/Interventions: Self-care/ADL training;Therapeutic exercise;DME and/or AE instruction;Energy conservation;Therapeutic activities;Patient/family education;Balance training    OT Goals(Current goals can be found in the care plan section) Acute Rehab OT Goals Patient Stated Goal: none stated OT Goal Formulation: With patient Time For Goal  Achievement: 06/03/23 Potential to Achieve Goals: Good ADL Goals Pt Will Perform Upper Body Dressing: with modified independence;sitting Pt Will Perform Lower Body Dressing: with modified independence;sitting/lateral leans;sit to/from stand Pt Will Transfer to Toilet: with modified independence;ambulating;regular height toilet Additional ADL Goal #1: pt will perform bed mobility with supervision in prep for ADLs Additional ADL Goal #2: pt will tolerate OOB activity x10 min in prep for ADLs  OT Frequency: Min 1X/week    Co-evaluation              AM-PAC OT "6 Clicks" Daily Activity     Outcome Measure Help from another person eating meals?: None Help from another person taking care of personal grooming?: A Little Help from another person toileting, which includes using toliet, bedpan, or urinal?: A Little Help from another person bathing (including washing, rinsing, drying)?: A Lot Help from another person to put on and taking off regular upper body clothing?: A Little Help from another person to put on and taking off regular lower body clothing?: A Lot 6 Click Score: 17   End of Session Nurse Communication: Mobility status  Activity Tolerance: Patient tolerated treatment well Patient left: in bed;with call bell/phone within reach;with bed alarm set;with nursing/sitter in room  OT Visit Diagnosis: Unsteadiness on feet (R26.81);Other abnormalities of gait and mobility (R26.89);Muscle weakness (generalized) (M62.81)  Time: 6213-0865 OT Time Calculation (min): 18 min Charges:  OT General Charges $OT Visit: 1 Visit OT Evaluation $OT Eval Moderate Complexity: 1 Mod  Innocence Schlotzhauer K, OTD, OTR/L [employer] [facility]erred Acute Rehab [phone]   Carver Fila Koonce 05/20/2023, 8:55 AM

## 2023-05-20 NOTE — Progress Notes (Signed)
[facility] night cross cover note:   I was notified by RN that this patient is complaining of bilateral hip discomfort that is refractory to prn acetaminophen.  Additionally, the patient is experiencing anxiety refractory to her home hydroxyzine.  I subsequently placed orders for prn IV Dilaudid as well as a one-time prn IV dose of Ativan for her anxiety.   Newton Pigg, DO Hospitalist

## 2023-05-20 NOTE — Progress Notes (Signed)
PROGRESS NOTE    Breanna Webster  HYQ:[id number] DOB: 1945/11/17 DOA: 05/18/2023 PCP: Shirline Frees, NP   Chief Complaint  Patient presents with   Urinary Tract Infection    Brief Narrative:   ill Breanna Webster is a 78 y.o. female with medical history significant of history of atrial fibrillation with RVR, prior strep bacteremia, CAD with stent to RCA in 2019, carotid artery stenosis with history of acute cerebellar stroke in 2020 and an embolic right posterior cerebral artery stroke in 2022, HFpEF, diverticulosis with prior diverticulitis, GERD, Graves' disease with posttreatment hypothyroidism, total shoulder replacement on the left, dyslipidemia, hypertension, parkinsonian symptoms, status post TAVR, type 2 diabetes.  -Patient presents to ED secondary to complaints of respiratory symptoms, patient was recently started on Augmentin and ciprofloxacin which she took p.o. doses x 2, with no improvement of her symptoms, and currently reporting flank pain as well which prompted her to come to ED.  Assessment & Plan:   Principal Problem:   Pyelonephritis Active Problems:   Essential hypertension   Paroxysmal atrial fibrillation (HCC)   AKI (acute kidney injury) (HCC)   Type 2 diabetes mellitus with other specified complication, with long-term current use of insulin (HCC)   Hypothyroidism   (HFpEF) heart failure with preserved ejection fraction (HCC)   IDA (iron deficiency anemia)   Anxiety   Sepsis secondary to UTI (HCC)   History of stroke   Low back pain   Lactic acidosis  Sepsis, present on admission, secondary to Breanna. coli UTI with  pyelonephritis -Lactic acid significantly elevated, concern for sepsis sepsis has normalized after IV fluids -Is having CVA tenderness-flank pain -Continue with IV Rocephin given recent culture data showing pansensitive Breanna. coli -Continue with IV fluids with -Lactic acidosis has resolved - will need to hold her SGL 2  Paroxysmal atrial fibrillation  (HCC) -Continue with Eliquis for anticoagulation -Blood pressure remains soft, she is on Cardizem CD, she was on Cardizem 30 mg every 6 hours given her soft blood pressure, improving, likely will transition to Cardizem CD by tomorrow.  Marland Kitchen  AKI (acute kidney injury) (HCC) -Blood pressure remains soft, continue with IV fluids and hold antihypertensive medications  Type 2 diabetes mellitus with other specified complication, with long-term current use of insulin (HCC) -Will hold SGLT2 in the setting of UTI -monitor CBG  History of CVA -Continue with Eliquis  Chest pain 1/26 afternoon -Appears to be more likely epigastric pain, EKG showing chronic left bundle branch block, her troponins non-ACS pattern, this has improved after having good bowel movement  Lower back pain -Chronic, no red flag signs, good lower extremity strength -Patient reports she is significantly claustrophobic, she declined MRI despite offer of an extra dose of Ativan -Did ask her to try Vicodin for pain, she is having significant confusion with IV pain medications, will add as needed oxycodone as well as needed  Hospital delirium -This was significant in the morning especially as she received some IV pain meds and Ativan overnight, it did improve through the day.  Hypothyroidism Patient continued on levothyroxine 137 mcg p.o. every morning.  Chronic heart failure with preserved ejection fraction (HCC) Stable patient is clinically euvolemic. Patient's diuretic currently held secondary to elevated lactic acid although blood pressure is within normal limits. Clinically patient appears dehydrated, and has dry mucous membranes. Strict I's and O's, daily weights.  Essential hypertension Blood pressure has been stable with systolic in the 120 mmHg range. Plan to continue spironolactone. Patient will benefit from ARB  or Ace inh as outpatient if blood pressure stable.    AKI (acute kidney injury) (HCC) Hyponatremia.  Hypokalemia.    Parkinsonism (HCC) Consult PT and OT  Depression Continue with amitriptyline and tizanidine.    Constipation -Had BM once the patient received magnesium citrate and Dulcolax suppository with good BM overnight, will continue with scheduled MiraLAX for another 24 hours   DVT prophylaxis: Eliquis Code Status: Full code Family Communication: Discussed with husband at bedside 1/26. Disposition:   Status is: Inpatient    Consultants:  None  Subjective:  Patient had complaints of chest pain yesterday, upon asking her today, report she did not have a chest pain, his chest was epigastric discomfort, has improved after BM.  Objective: Vitals:   05/20/23 0500 05/20/23 0641 05/20/23 0753 05/20/23 1153  BP:  125/66 113/61 136/70  Pulse:   83 92  Resp:   17 18  Temp:   98.6 F (37 C) 98 F (36.7 C)  TempSrc:   Oral Oral  SpO2:   96% 95%  Weight: 77.2 kg     Height:        Intake/Output Summary (Last 24 hours) at 05/20/2023 1528 Last data filed at 05/20/2023 1400 Gross per 24 hour  Intake 239 ml  Output 200 ml  Net 39 ml   Filed Weights   05/18/23 1119 05/20/23 0500  Weight: 74.8 kg 77.2 kg    Examination:  Alert, appropriate, mildly sleepy in the morning as she received IV Dilaudid few hours ago  Symmetrical Chest wall movement, Good air movement bilaterally, CTAB RRR,No Gallops,Rubs or new Murmurs, No Parasternal Heave +ve B.Sounds, Abd Soft, No tenderness, No rebound - guarding or rigidity. No Cyanosis, Clubbing or edema, No new Rash or bruise      Data Reviewed: I have personally reviewed following labs and imaging studies  CBC: Recent Labs  Lab 05/18/23 1030 05/19/23 0743  WBC 7.7 5.5  NEUTROABS 6.4  --   HGB 10.9* 8.8*  HCT 35.8* 28.5*  MCV 59.6* 59.5*  PLT 178 128*    Basic Metabolic Panel: Recent Labs  Lab 05/18/23 1030 05/19/23 0743  NA 134* 136  K 3.6 3.6  CL 104 109  CO2 17* 18*  GLUCOSE 159* 160*  BUN 26* 21   CREATININE 1.06* 1.26*  CALCIUM 10.3 8.9  MG 1.9  --     GFR: Estimated Creatinine Clearance: 37.2 mL/min (A) (by C-G formula based on SCr of 1.26 mg/dL (H)).  Liver Function Tests: Recent Labs  Lab 05/18/23 1030 05/19/23 0743  AST 30 22  ALT 23 18  ALKPHOS 61 59  BILITOT 1.1 1.1  PROT 8.0 6.1*  ALBUMIN 3.9 3.3*    CBG: Recent Labs  Lab 05/19/23 1250 05/19/23 1619 05/19/23 2156 05/20/23 0752 05/20/23 1155  GLUCAP 140* 135* 146* 144* 171*     Recent Results (from the past 240 hours)  Urine Culture     Status: Abnormal   Collection Time: 05/15/23 12:09 PM   Specimen: Urine  Result Value Ref Range Status   MICRO NUMBER: [id number]  Final   SPECIMEN QUALITY: Adequate  Final   Sample Source URINE  Final   STATUS: FINAL  Final   ISOLATE 1: Escherichia coli (A)  Final    Comment: Greater than 100,000 CFU/mL of Escherichia coli      Susceptibility   Escherichia coli - URINE CULTURE, REFLEX    AMOX/CLAVULANIC <=2 Sensitive     AMPICILLIN 4 Sensitive  AMPICILLIN/SULBACTAM <=2 Sensitive     CEFAZOLIN* <=4 Not Reportable      * For infections other than uncomplicated UTI caused by Breanna. coli, K. pneumoniae or P. mirabilis: Cefazolin is resistant if MIC > or = 8 mcg/mL. (Distinguishing susceptible versus intermediate for isolates with MIC < or = 4 mcg/mL requires additional testing.) For uncomplicated UTI caused by Breanna. coli, K. pneumoniae or P. mirabilis: Cefazolin is susceptible if MIC <32 mcg/mL and predicts susceptible to the oral agents cefaclor, cefdinir, cefpodoxime, cefprozil, cefuroxime, cephalexin and loracarbef.     CEFTAZIDIME <=1 Sensitive     CEFEPIME <=1 Sensitive     CEFTRIAXONE <=1 Sensitive     CIPROFLOXACIN >=4 Resistant     LEVOFLOXACIN >=8 Resistant     GENTAMICIN <=1 Sensitive     IMIPENEM <=0.25 Sensitive     NITROFURANTOIN <=16 Sensitive     PIP/TAZO <=4 Sensitive     TOBRAMYCIN <=1 Sensitive     TRIMETH/SULFA* <=20 Sensitive      *  For infections other than uncomplicated UTI caused by Breanna. coli, K. pneumoniae or P. mirabilis: Cefazolin is resistant if MIC > or = 8 mcg/mL. (Distinguishing susceptible versus intermediate for isolates with MIC < or = 4 mcg/mL requires additional testing.) For uncomplicated UTI caused by Breanna. coli, K. pneumoniae or P. mirabilis: Cefazolin is susceptible if MIC <32 mcg/mL and predicts susceptible to the oral agents cefaclor, cefdinir, cefpodoxime, cefprozil, cefuroxime, cephalexin and loracarbef. Legend: S = Susceptible  I = Intermediate R = Resistant  NS = Not susceptible SDD = Susceptible Dose Dependent * = Not Tested  NR = Not Reported **NN = See Therapy Comments   Culture, blood (routine x 2)     Status: None (Preliminary result)   Collection Time: 05/18/23  3:28 PM   Specimen: BLOOD RIGHT FOREARM  Result Value Ref Range Status   Specimen Description BLOOD RIGHT FOREARM  Final   Special Requests   Final    BOTTLES DRAWN AEROBIC AND ANAEROBIC Blood Culture results may not be optimal due to an inadequate volume of blood received in culture bottles   Culture   Final    NO GROWTH 2 DAYS Performed at [facility], [address] [place], [place] [address]    Report Status PENDING  Incomplete  Culture, blood (routine x 2)     Status: None (Preliminary result)   Collection Time: 05/18/23  8:08 PM   Specimen: BLOOD  Result Value Ref Range Status   Specimen Description BLOOD SITE NOT SPECIFIED  Final   Special Requests   Final    BOTTLES DRAWN AEROBIC AND ANAEROBIC Blood Culture results may not be optimal due to an inadequate volume of blood received in culture bottles   Culture   Final    NO GROWTH 2 DAYS Performed at [facility], [address] [place], [place] [address]    Report Status PENDING  Incomplete         Radiology Studies: No results found.       Scheduled Meds:  allopurinol  100 mg Oral Daily   amitriptyline  25 mg Oral QHS   apixaban   5 mg Oral BID   bisacodyl  10 mg Oral Once   Chlorhexidine Gluconate Cloth  6 each Topical Daily   diltiazem  30 mg Oral Q6H   insulin aspart  0-15 Units Subcutaneous TID WC   levothyroxine  137 mcg Oral q morning   mirabegron ER  25 mg Oral Daily   phenazopyridine  200 mg Oral TID WC   polyethylene glycol  17 g Oral BID   simethicone  80 mg Oral Once   sodium chloride flush  3 mL Intravenous Q12H   Continuous Infusions:  cefTRIAXone (ROCEPHIN)  IV 2 g (05/19/23 1424)     LOS: 2 days       Huey Bienenstock, MD [facility]   To contact the attending provider between 7A-7P or the covering provider during after hours 7P-7A, please log into the web site [email or web address] and access using universal Welcome password for that web site. If you do not have the password, please call the hospital operator.  05/20/2023, 3:28 PM

## 2023-05-20 NOTE — Discharge Instructions (Signed)
Myelogram Discharge Instructions  Go home and rest quietly as needed. You may resume normal activities; however, do not exert yourself strongly or do any heavy lifting today and tomorrow.   DO NOT drive today.    You may resume your normal diet and medications unless otherwise indicated. Drink lots of extra fluids today and tomorrow.   The incidence of headache, nausea, or vomiting is about 5% (one in 20 patients).  If you develop a headache, lie flat for 24 hours and drink plenty of fluids until the headache goes away.  Caffeinated beverages may be helpful. If when you get up you still have a headache when standing, go back to bed and force fluids for another 24 hours.   If you develop severe nausea and vomiting or a headache that does not go away with the flat bedrest after 48 hours, please call [phone].   Call your physician for a follow-up appointment.  The results of your myelogram will be sent directly to your physician by the following day.  If you have any questions or if complications develop after you arrive home, please call [phone].  Discharge instructions have been explained to the patient.  The patient, or the person responsible for the patient, fully understands these instructions.   Thank you for visiting our office today.

## 2023-05-20 NOTE — TOC Progression Note (Signed)
Transition of Care [facility]) - Progression Note    Patient Details  Name: Breanna Webster MRN: [id number] Date of Birth: [birth year]  [facility] CM/SW Contact  Gordy Clement, RN Phone Number: 05/20/2023, 2:43 PM  Clinical Narrative:   Met with patient bedside to discuss DC plan to home with Husband. HH PT/ OT have been recommended. [facility] is choice and will provide services. Patient declining BSC at this time stating she has one.   TOC will continue to follow patient for any additional discharge needs            Expected Discharge Plan and Services                                               Social Determinants of Health (SDOH) Interventions SDOH Screenings   Food Insecurity: No Food Insecurity (05/02/2023)   Received from [facility]  Housing: Low Risk  (05/02/2023)   Received from [facility]  Transportation Needs: No Transportation Needs (05/02/2023)   Received from [facility]  Utilities: Not At Risk (05/02/2023)   Received from [facility]  Alcohol Screen: Low Risk  (08/29/2022)  Depression (PHQ2-9): Low Risk  (08/29/2022)  Financial Resource Strain: Patient Declined (12/09/2022)  Physical Activity: Unknown (12/09/2022)  Social Connections: Unknown (12/09/2022)  Stress: No Stress Concern Present (05/02/2023)   Received from [facility]  Tobacco Use: Low Risk  (05/15/2023)    Readmission Risk Interventions     No data to display

## 2023-05-20 NOTE — Plan of Care (Signed)
  Problem: Coping: Goal: Ability to adjust to condition or change in health will improve Outcome: Progressing   Problem: Clinical Measurements: Goal: Will remain free from infection Outcome: Progressing   Problem: Coping: Goal: Level of anxiety will decrease Outcome: Progressing   Problem: Pain Managment: Goal: General experience of comfort will improve and/or be controlled Outcome: Progressing   Problem: Safety: Goal: Ability to remain free from injury will improve Outcome: Progressing

## 2023-05-20 NOTE — Progress Notes (Signed)
TRH night cross cover note:   I was notified by RN of the patient's request for medication for this sleep and anxiety.   I subsequently resumed her home as needed hydroxyzine for anxiety and sleep.     Newton Pigg, DO Hospitalist

## 2023-05-21 ENCOUNTER — Other Ambulatory Visit (HOSPITAL_COMMUNITY): Payer: Self-pay

## 2023-05-21 ENCOUNTER — Inpatient Hospital Stay
Admission: RE | Admit: 2023-05-21 | Discharge: 2023-05-21 | Disposition: A | Discharge status: Home / Self Care | Payer: Medicare Other | Source: Ambulatory Visit | Attending: Physician Assistant | Admitting: Physician Assistant

## 2023-05-21 DIAGNOSIS — N12 Tubulo-interstitial nephritis, not specified as acute or chronic: Secondary | ICD-10-CM | POA: Diagnosis not present

## 2023-05-21 LAB — GLUCOSE, CAPILLARY: Glucose-Capillary: 123 mg/dL — ABNORMAL HIGH (ref 70–99)

## 2023-05-21 MED ORDER — SODIUM CHLORIDE 0.9 % IV SOLN
2.0000 g | INTRAVENOUS | Status: DC
  Administered 2023-05-21: 2 g via INTRAVENOUS
  Filled 2023-05-21: qty 20

## 2023-05-21 MED ORDER — HUMULIN N KWIKPEN 100 UNIT/ML ~~LOC~~ SUPN: 20.0000 [IU] | PEN_INJECTOR | Freq: Two times a day (BID) | SUBCUTANEOUS | Status: DC

## 2023-05-21 MED ORDER — SPIRONOLACTONE 25 MG PO TABS
12.5000 mg | ORAL_TABLET | Freq: Every day | ORAL | 0 refills | Status: AC
  Filled 2023-05-21: qty 15, 30d supply, fill #0

## 2023-05-21 MED ORDER — TORSEMIDE 20 MG PO TABS
40.0000 mg | ORAL_TABLET | Freq: Every day | ORAL | 0 refills | Status: DC
  Filled 2023-05-21: qty 60, 30d supply, fill #0

## 2023-05-21 MED ORDER — INSULIN REGULAR HUMAN 100 UNIT/ML IJ SOLN: INTRAMUSCULAR | Status: DC

## 2023-05-21 MED ORDER — CEPHALEXIN 500 MG PO CAPS
500.0000 mg | ORAL_CAPSULE | Freq: Two times a day (BID) | ORAL | 0 refills | Status: AC
  Filled 2023-05-21: qty 14, 7d supply, fill #0

## 2023-05-21 NOTE — Discharge Instructions (Signed)
Follow with Primary MD Shirline Frees, NP in 7 days   Get CBC, CMP,  checked  by Primary MD next visit.    Activity: As tolerated with Full fall precautions use walker/cane & assistance as needed   Disposition Home    Diet: Heart Healthy /carb modified  On your next visit with your primary care physician please Get Medicines reviewed and adjusted.   Please request your Prim.MD to go over all Hospital Tests and Procedure/Radiological results at the follow up, please get all Hospital records sent to your Prim MD by signing hospital release before you go home.   If you experience worsening of your admission symptoms, develop shortness of breath, life threatening emergency, suicidal or homicidal thoughts you must seek medical attention immediately by calling 911 or calling your MD immediately  if symptoms less severe.  You Must read complete instructions/literature along with all the possible adverse reactions/side effects for all the Medicines you take and that have been prescribed to you. Take any new Medicines after you have completely understood and accpet all the possible adverse reactions/side effects.   Do not drive, operating heavy machinery, perform activities at heights, swimming or participation in water activities or provide baby sitting services if your were admitted for syncope or siezures until you have seen by Primary MD or a Neurologist and advised to do so again.  Do not drive when taking Pain medications.    Do not take more than prescribed Pain, Sleep and Anxiety Medications  Special Instructions: If you have smoked or chewed Tobacco  in the last 2 yrs please stop smoking, stop any regular Alcohol  and or any Recreational drug use.  Wear Seat belts while driving.   Please note  You were cared for by a hospitalist during your hospital stay. If you have any questions about your discharge medications or the care you received while you were in the hospital after you  are discharged, you can call the unit and asked to speak with the hospitalist on call if the hospitalist that took care of you is not available. Once you are discharged, your primary care physician will handle any further medical issues. Please note that NO REFILLS for any discharge medications will be authorized once you are discharged, as it is imperative that you return to your primary care physician (or establish a relationship with a primary care physician if you do not have one) for your aftercare needs so that they can reassess your need for medications and monitor your lab values.

## 2023-05-21 NOTE — Progress Notes (Signed)
   05/21/23 0941  Mobility  Activity Transferred from chair to bed  Level of Assistance Contact guard assist, steadying assist  Assistive Device Other (Comment) (HHA)  Activity Response Tolerated fair  Mobility Referral Yes  Mobility visit 1 Mobility  Mobility Specialist Start Time (ACUTE ONLY) [id number]  Mobility Specialist Stop Time (ACUTE ONLY) 0941  Mobility Specialist Time Calculation (min) (ACUTE ONLY) 14 min   Mobility Specialist: Progress Note  Pt agreeable to mobility session - requested transfer per RN - received in chair. C/o back pain. Returned to bed with all needs met - call bell within reach.   Barnie Mort, BS Mobility Specialist Please contact via SecureChat or  Rehab office at [phone].

## 2023-05-21 NOTE — Discharge Summary (Signed)
Physician Discharge Summary  Breanna Webster ZOX:[id number] DOB: 10/25/1945 DOA: 05/18/2023  PCP: Shirline Frees, NP  Admit date: 05/18/2023 Discharge date: 05/21/2023  Admitted From: (Home) Disposition:  (Home)  Recommendations for Outpatient Follow-up:  Follow up with PCP in 1-2 weeks Please obtain BMP/CBC in one week Patient was instructed to hold her Aldactone and torsemide for next 3 days, and to decrease her insulin regimen by 50% oral intake back to baseline  Home Health: (YES)  Diet recommendation: Heart Healthy / Carb Modified   Brief/Interim Summary: Breanna Webster is a 78 y.o. female with medical history significant of history of atrial fibrillation with RVR, prior strep bacteremia, CAD with stent to RCA in 2019, carotid artery stenosis with history of acute cerebellar stroke in 2020 and an embolic right posterior cerebral artery stroke in 2022, HFpEF, diverticulosis with prior diverticulitis, GERD, Graves' disease with posttreatment hypothyroidism, total shoulder replacement on the left, dyslipidemia, hypertension, parkinsonian symptoms, status post TAVR, type 2 diabetes.  -Patient presents to ED secondary to complaints of respiratory symptoms, patient was recently started on Augmentin and ciprofloxacin which she took p.o. doses x 2, with no improvement of her symptoms, and currently reporting flank pain as well which prompted her to come to ED.   Sepsis, present on admission, secondary to E. coli UTI with  pyelonephritis -Lactic acid significantly elevated, concern for sepsis sepsis has normalized after IV fluids -Has been having some flank pain as well -Failed IV Rocephin during hospital stay, received total of 3 doses, blood cultures remain negative, she will be discharged on Keflex another 7 days which should be 10 days total treatment course sufficient for pyelonephritis  -Treated with IV fluids, euvolemic at time of discharge -Lactic acidosis has resolved -Jardiance has  had since been discontinued at time of discharge as she was just charted on it last month and she already admitted to the hospital with flexion  Paroxysmal atrial fibrillation (HCC) -Continue with Eliquis for anticoagulation -Blood pressure remains soft, so her Cardizem CD initially held she was on short acting Cardizem, now blood pressure has improved she has been resumed on her home dose Cardizem CD.    AKI (acute kidney injury) (HCC) -Blood pressure remains soft, with worsening creatinine, antihypertensive medications has been held, including torsemide and Aldactone, this has improved, renal function back to baseline, instructed to hold torsemide and Aldactone for next 3 days.   -Jardiance has been discontinued at time of discharge    Type 2 diabetes mellitus with other specified complication, with long-term current use of insulin (HCC) -Will hold SGLT2 in the setting of UTI -C acceptable at 6.7, her CBG well-controlled during hospital stay with minimal insulin requirement, so I have discussed with patient-husband to decrease her insulin home dose by 50% for the next 3 to 4 days and to resume home dose once CBG started to increase  History of CVA -Continue with Eliquis   Chest pain 1/26 afternoon -Appears to be more likely epigastric pain, EKG showing chronic left bundle branch block, her troponins non-ACS pattern, this has improved after having good bowel movement   Lower back pain -Chronic, no red flag signs, good lower extremity strength -Patient reports she is significantly claustrophobic, she declined MRI despite offer of an extra dose of Ativan   Hospital delirium -This was significant in the morning especially as she received some IV pain meds and Ativan overnight, it did improve through the day.  No further hospital delirium as IV narcotics and Ativan has  been discontinued.   Hypothyroidism Patient continued on levothyroxine 137 mcg p.o. every morning.   Chronic heart failure  with preserved ejection fraction (HCC) Stable patient is clinically euvolemic and of discharge, she was hypovolemic upon presentation. Patient's diuretic currently held secondary to elevated lactic acid although blood pressure is within normal limits. Home torsemide and Aldactone after 3 days   Essential hypertension Continue home dose Cardizem CD, resume torsemide and Aldactone in 3 days   Hyponatremia. Hypokalemia.  -Resolved   Parkinsonism (HCC) Consult PT and OT   Depression Continue with amitriptyline and tizanidine.    Constipation -This has resolved, had good BM after receiving magnesium citrate, smog enema and Dulcolax suppository    Discharge Diagnoses:  Principal Problem:   Pyelonephritis Active Problems:   Essential hypertension   Paroxysmal atrial fibrillation (HCC)   AKI (acute kidney injury) (HCC)   Type 2 diabetes mellitus with other specified complication, with long-term current use of insulin (HCC)   Hypothyroidism   (HFpEF) heart failure with preserved ejection fraction (HCC)   IDA (iron deficiency anemia)   Anxiety   Sepsis secondary to UTI (HCC)   History of stroke   Low back pain   Lactic acidosis    Discharge Instructions  Discharge Instructions     Diet - low sodium heart healthy   Complete by: As directed    Increase activity slowly   Complete by: As directed       Allergies as of 05/21/2023       Reactions   Statins Other (See Comments)   Tried simvastatin, atorvastatin; on Repatha        Medication List     STOP taking these medications    amoxicillin-clavulanate 875-125 MG tablet Commonly known as: AUGMENTIN   Jardiance 10 MG Tabs tablet Generic drug: empagliflozin       TAKE these medications    Acetaminophen Extra Strength 500 MG Tabs Take 1 tablet (500 mg total) by mouth 4 (four) times daily.   allopurinol 100 MG tablet Commonly known as: ZYLOPRIM Take 1 tablet (100 mg total) by mouth daily.    amitriptyline 25 MG tablet Commonly known as: ELAVIL Take 1 tablet (25 mg total) by mouth at bedtime. What changed:  when to take this reasons to take this   b complex vitamins capsule Take 1 capsule by mouth daily.   bisacodyl 5 MG EC tablet Generic drug: bisacodyl Take 1 Tablet with each dose pf pain medicine.   cephALEXin 500 MG capsule Commonly known as: KEFLEX Take 1 capsule (500 mg total) by mouth 2 (two) times daily for 7 days.   colchicine 0.6 MG tablet Take 1 tablet (0.6 mg total) by mouth daily. as needed for gout   D3 PO Take 1 tablet by mouth daily.   diltiazem 120 MG 24 hr capsule Commonly known as: Cartia XT Take 1 capsule (120 mg total) by mouth daily.   Eliquis 5 MG Tabs tablet Generic drug: apixaban Take 1 tablet (5 mg total) by mouth 2 (two) times daily.   Gemtesa 75 MG Tabs Generic drug: Vibegron Take 75 mg by mouth at bedtime.   HumuLIN N KwikPen 100 UNIT/ML KwikPen Generic drug: Insulin NPH (Human) (Isophane) Inject 20 Units into the skin in the morning and at bedtime. Decrease your dose to 10 units every morning, and 7 units every evening still appetite back to normal What changed: additional instructions   hydrOXYzine 25 MG capsule Commonly known as: VISTARIL Take 1 capsule (25  mg total) by mouth every 8 (eight) hours as needed.   insulin regular 100 units/mL injection Commonly known as: HumuLIN R Please decrease your dose to 5 units 3 times daily before meals until appetite picks up back to normal What changed:  how much to take how to take this when to take this additional instructions   metFORMIN 500 MG 24 hr tablet Commonly known as: GLUCOPHAGE-XR Take 1 tablet by mouth 2 times daily   multivitamin with minerals Tabs tablet Take 1 tablet by mouth daily.   nitroGLYCERIN 0.4 MG SL tablet Commonly known as: NITROSTAT Place 1 tablet (0.4 mg total) under the tongue every 5 (five) minutes as needed for chest pain.   omeprazole 20  MG capsule Commonly known as: PRILOSEC Take 1 capsule (20 mg total) by mouth daily.   ondansetron 4 MG tablet Commonly known as: Zofran Take 1 tablet (4 mg total) by mouth every 8 (eight) hours as needed for nausea or vomiting.   oxyCODONE 5 MG immediate release tablet Commonly known as: Oxy IR/ROXICODONE Take 5 mg by mouth every 6 (six) hours as needed for severe pain (pain score 7-10).   polyethylene glycol 17 g packet Commonly known as: MiraLax Take 17 g by mouth daily as needed.   potassium chloride SA 20 MEQ tablet Commonly known as: KLOR-CON M Take 0.5 tablets (10 mEq total) by mouth daily. Take in concurrence with torsemide. What changed: how much to take   spironolactone 25 MG tablet Commonly known as: ALDACTONE Take 0.5 tablets (12.5 mg total) by mouth daily. Start taking on: May 24, 2023 What changed: These instructions start on May 24, 2023. If you are unsure what to do until then, ask your doctor or other care provider.   Synthroid 137 MCG tablet Generic drug: levothyroxine Take 1 tablet (137 mcg total) by mouth every morning on empty stomach.   tiZANidine 4 MG tablet Commonly known as: ZANAFLEX Take 1 tablet (4 mg total) by mouth 3 (three) times daily. What changed:  when to take this reasons to take this   torsemide 20 MG tablet Commonly known as: DEMADEX Take 2 tablets (40 mg total) by mouth daily. Start taking on: May 24, 2023 What changed: These instructions start on May 24, 2023. If you are unsure what to do until then, ask your doctor or other care provider.   vitamin E 180 MG (400 UNITS) capsule Take 400 Units by mouth daily.        Follow-up Information     Care, [facility] Follow up.   Specialty: Home Health Services Why: [facility] health will contact you within 48 hours of discharge to arrange your home health visits Contact information: [address] [place] [address] [phone]                 Allergies  Allergen Reactions   Statins Other (See Comments)    Tried simvastatin, atorvastatin; on Repatha    Consultations: None   Procedures/Studies: CT ABDOMEN PELVIS W CONTRAST Result Date: 05/18/2023 CLINICAL DATA:  Severe abdominal and flank pain. Nausea. Urinary tract infection. Constipation. EXAM: CT ABDOMEN AND PELVIS WITH CONTRAST TECHNIQUE: Multidetector CT imaging of the abdomen and pelvis was performed using the standard protocol following bolus administration of intravenous contrast. RADIATION DOSE REDUCTION: This exam was performed according to the departmental dose-optimization program which includes automated exposure control, adjustment of the mA and/or kV according to patient size and/or use of iterative reconstruction technique. CONTRAST:  75mL OMNIPAQUE IOHEXOL 350 MG/ML SOLN COMPARISON:  03/25/2023 FINDINGS: Lower Chest: No acute findings. Hepatobiliary: No suspicious hepatic masses identified. Mild capsular nodularity is suspicious for cirrhosis. Prior cholecystectomy. No evidence of biliary obstruction. Pancreas: No mass or inflammatory changes. Diffuse fatty replacement again noted. Spleen: Within normal limits in size and appearance. Adrenals/Urinary Tract: No suspicious masses identified. No evidence of ureteral calculi or hydronephrosis. Foley catheter seen in the bladder which is empty. Stomach/Bowel: No evidence of obstruction, inflammatory process or abnormal fluid collections. Normal appendix visualized. Diffuse colonic diverticulosis again seen, without signs of diverticulitis. Vascular/Lymphatic: No pathologically enlarged lymph nodes. No acute vascular findings. Reproductive: 2 cm left posterior uterine fibroid again seen. Adnexal regions are unremarkable. Other:  None. Musculoskeletal:  No suspicious bone lesions identified. IMPRESSION: No acute findings. Colonic diverticulosis, without radiographic evidence of diverticulitis. Small uterine fibroid.  Probable hepatic cirrhosis. Electronically Signed   By: Danae Orleans M.D.   On: 05/18/2023 14:28  \ Subjective:  Patient reports she is feeling better this morning, abdominal pain, nausea or vomiting  I have discussed discharge plan and recommendations with husband by phone. Discharge Exam: Vitals:   05/21/23 0714 05/21/23 0808  BP:  139/72  Pulse: 81 91  Resp: 14 19  Temp:  97.6 F (36.4 C)  SpO2: 96% 96%   Vitals:   05/20/23 2317 05/21/23 0338 05/21/23 0714 05/21/23 0808  BP: 133/73 (!) 153/83  139/72  Pulse: 96 (!) 155 81 91  Resp: 18 19 14 19   Temp: 97.7 F (36.5 C) 97.6 F (36.4 C)  97.6 F (36.4 C)  TempSrc: Oral Oral  Oral  SpO2: 93% 94% 96% 96%  Weight:      Height:        General: Pt is alert, awake, not in acute distress Cardiovascular: RRR, S1/S2 +, no rubs, no gallops Respiratory: CTA bilaterally, no wheezing, no rhonchi Abdominal: Soft, NT, ND, bowel sounds + Extremities: no edema, no cyanosis    The results of significant diagnostics from this hospitalization (including imaging, microbiology, ancillary and laboratory) are listed below for reference.     Microbiology: Recent Results (from the past 240 hours)  Urine Culture     Status: Abnormal   Collection Time: 05/15/23 12:09 PM   Specimen: Urine  Result Value Ref Range Status   MICRO NUMBER: [id number]  Final   SPECIMEN QUALITY: Adequate  Final   Sample Source URINE  Final   STATUS: FINAL  Final   ISOLATE 1: Escherichia coli (A)  Final    Comment: Greater than 100,000 CFU/mL of Escherichia coli      Susceptibility   Escherichia coli - URINE CULTURE, REFLEX    AMOX/CLAVULANIC <=2 Sensitive     AMPICILLIN 4 Sensitive     AMPICILLIN/SULBACTAM <=2 Sensitive     CEFAZOLIN* <=4 Not Reportable      * For infections other than uncomplicated UTI caused by E. coli, K. pneumoniae or P. mirabilis: Cefazolin is resistant if MIC > or = 8 mcg/mL. (Distinguishing susceptible versus intermediate for  isolates with MIC < or = 4 mcg/mL requires additional testing.) For uncomplicated UTI caused by E. coli, K. pneumoniae or P. mirabilis: Cefazolin is susceptible if MIC <32 mcg/mL and predicts susceptible to the oral agents cefaclor, cefdinir, cefpodoxime, cefprozil, cefuroxime, cephalexin and loracarbef.     CEFTAZIDIME <=1 Sensitive     CEFEPIME <=1 Sensitive     CEFTRIAXONE <=1 Sensitive     CIPROFLOXACIN >=4 Resistant     LEVOFLOXACIN >=  8 Resistant     GENTAMICIN <=1 Sensitive     IMIPENEM <=0.25 Sensitive     NITROFURANTOIN <=16 Sensitive     PIP/TAZO <=4 Sensitive     TOBRAMYCIN <=1 Sensitive     TRIMETH/SULFA* <=20 Sensitive      * For infections other than uncomplicated UTI caused by E. coli, K. pneumoniae or P. mirabilis: Cefazolin is resistant if MIC > or = 8 mcg/mL. (Distinguishing susceptible versus intermediate for isolates with MIC < or = 4 mcg/mL requires additional testing.) For uncomplicated UTI caused by E. coli, K. pneumoniae or P. mirabilis: Cefazolin is susceptible if MIC <32 mcg/mL and predicts susceptible to the oral agents cefaclor, cefdinir, cefpodoxime, cefprozil, cefuroxime, cephalexin and loracarbef. Legend: S = Susceptible  I = Intermediate R = Resistant  NS = Not susceptible SDD = Susceptible Dose Dependent * = Not Tested  NR = Not Reported **NN = See Therapy Comments   Culture, blood (routine x 2)     Status: None (Preliminary result)   Collection Time: 05/18/23  3:28 PM   Specimen: BLOOD RIGHT FOREARM  Result Value Ref Range Status   Specimen Description BLOOD RIGHT FOREARM  Final   Special Requests   Final    BOTTLES DRAWN AEROBIC AND ANAEROBIC Blood Culture results may not be optimal due to an inadequate volume of blood received in culture bottles   Culture   Final    NO GROWTH 3 DAYS Performed at [facility], [address] [place], [place] [address]    Report Status PENDING  Incomplete  Culture, blood (routine x 2)      Status: None (Preliminary result)   Collection Time: 05/18/23  8:08 PM   Specimen: BLOOD  Result Value Ref Range Status   Specimen Description BLOOD SITE NOT SPECIFIED  Final   Special Requests   Final    BOTTLES DRAWN AEROBIC AND ANAEROBIC Blood Culture results may not be optimal due to an inadequate volume of blood received in culture bottles   Culture   Final    NO GROWTH 3 DAYS Performed at [facility], [address] [place], [place] [address]    Report Status PENDING  Incomplete     Labs: BNP (last 3 results) Recent Labs    10/16/22 1313 04/04/23 2114  BNP 182.5* 401.3*   Basic Metabolic Panel: Recent Labs  Lab 05/18/23 1030 05/19/23 0743 05/20/23 1607  NA 134* 136 137  K 3.6 3.6 4.1  CL 104 109 109  CO2 17* 18* 19*  GLUCOSE 159* 160* 179*  BUN 26* 21 13  CREATININE 1.06* 1.26* 0.79  CALCIUM 10.3 8.9 9.1  MG 1.9  --   --    Liver Function Tests: Recent Labs  Lab 05/18/23 1030 05/19/23 0743  AST 30 22  ALT 23 18  ALKPHOS 61 59  BILITOT 1.1 1.1  PROT 8.0 6.1*  ALBUMIN 3.9 3.3*   Recent Labs  Lab 05/19/23 0114  LIPASE 18   No results for input(s): "AMMONIA" in the last 168 hours. CBC: Recent Labs  Lab 05/18/23 1030 05/19/23 0743 05/20/23 1607  WBC 7.7 5.5 6.5  NEUTROABS 6.4  --   --   HGB 10.9* 8.8* 9.3*  HCT 35.8* 28.5* 29.9*  MCV 59.6* 59.5* 58.4*  PLT 178 128* 138*   Cardiac Enzymes: No results for input(s): "CKTOTAL", "CKMB", "CKMBINDEX", "TROPONINI" in the last 168 hours. BNP: Invalid input(s): "POCBNP" CBG: Recent Labs  Lab 05/19/23 2156  05/20/23 0752 05/20/23 1155 05/20/23 1619 05/21/23 0808  GLUCAP 146* 144* 171* 193* 123*   D-Dimer No results for input(s): "DDIMER" in the last 72 hours. Hgb A1c Recent Labs    05/20/23 1607  HGBA1C 6.7*   Lipid Profile Recent Labs    05/19/23 0114  CHOL 132  HDL 33*  LDLCALC 74  TRIG 295  CHOLHDL 4.0   Thyroid function studies No results for input(s): "TSH",  "T4TOTAL", "T3FREE", "THYROIDAB" in the last 72 hours.  Invalid input(s): "FREET3" Anemia work up No results for input(s): "VITAMINB12", "FOLATE", "FERRITIN", "TIBC", "IRON", "RETICCTPCT" in the last 72 hours. Urinalysis    Component Value Date/Time   COLORURINE YELLOW 05/18/2023 1153   APPEARANCEUR HAZY (A) 05/18/2023 1153   LABSPEC 1.021 05/18/2023 1153   PHURINE 5.0 05/18/2023 1153   GLUCOSEU >=500 (A) 05/18/2023 1153   GLUCOSEU >=1000 (A) 05/15/2023 1209   HGBUR NEGATIVE 05/18/2023 1153   HGBUR negative 12/13/2008 1035   BILIRUBINUR NEGATIVE 05/18/2023 1153   BILIRUBINUR negative 11/07/2021 1439   KETONESUR 5 (A) 05/18/2023 1153   PROTEINUR NEGATIVE 05/18/2023 1153   UROBILINOGEN 0.2 05/15/2023 1209   NITRITE NEGATIVE 05/18/2023 1153   LEUKOCYTESUR MODERATE (A) 05/18/2023 1153   Sepsis Labs Recent Labs  Lab 05/18/23 1030 05/19/23 0743 05/20/23 1607  WBC 7.7 5.5 6.5   Microbiology Recent Results (from the past 240 hours)  Urine Culture     Status: Abnormal   Collection Time: 05/15/23 12:09 PM   Specimen: Urine  Result Value Ref Range Status   MICRO NUMBER: [id number]  Final   SPECIMEN QUALITY: Adequate  Final   Sample Source URINE  Final   STATUS: FINAL  Final   ISOLATE 1: Escherichia coli (A)  Final    Comment: Greater than 100,000 CFU/mL of Escherichia coli      Susceptibility   Escherichia coli - URINE CULTURE, REFLEX    AMOX/CLAVULANIC <=2 Sensitive     AMPICILLIN 4 Sensitive     AMPICILLIN/SULBACTAM <=2 Sensitive     CEFAZOLIN* <=4 Not Reportable      * For infections other than uncomplicated UTI caused by E. coli, K. pneumoniae or P. mirabilis: Cefazolin is resistant if MIC > or = 8 mcg/mL. (Distinguishing susceptible versus intermediate for isolates with MIC < or = 4 mcg/mL requires additional testing.) For uncomplicated UTI caused by E. coli, K. pneumoniae or P. mirabilis: Cefazolin is susceptible if MIC <32 mcg/mL and predicts susceptible to the  oral agents cefaclor, cefdinir, cefpodoxime, cefprozil, cefuroxime, cephalexin and loracarbef.     CEFTAZIDIME <=1 Sensitive     CEFEPIME <=1 Sensitive     CEFTRIAXONE <=1 Sensitive     CIPROFLOXACIN >=4 Resistant     LEVOFLOXACIN >=8 Resistant     GENTAMICIN <=1 Sensitive     IMIPENEM <=0.25 Sensitive     NITROFURANTOIN <=16 Sensitive     PIP/TAZO <=4 Sensitive     TOBRAMYCIN <=1 Sensitive     TRIMETH/SULFA* <=20 Sensitive      * For infections other than uncomplicated UTI caused by E. coli, K. pneumoniae or P. mirabilis: Cefazolin is resistant if MIC > or = 8 mcg/mL. (Distinguishing susceptible versus intermediate for isolates with MIC < or = 4 mcg/mL requires additional testing.) For uncomplicated UTI caused by E. coli, K. pneumoniae or P. mirabilis: Cefazolin is susceptible if MIC <32 mcg/mL and predicts susceptible to the oral agents cefaclor, cefdinir, cefpodoxime, cefprozil, cefuroxime, cephalexin and loracarbef. Legend: S = Susceptible  I =  Intermediate R = Resistant  NS = Not susceptible SDD = Susceptible Dose Dependent * = Not Tested  NR = Not Reported **NN = See Therapy Comments   Culture, blood (routine x 2)     Status: None (Preliminary result)   Collection Time: 05/18/23  3:28 PM   Specimen: BLOOD RIGHT FOREARM  Result Value Ref Range Status   Specimen Description BLOOD RIGHT FOREARM  Final   Special Requests   Final    BOTTLES DRAWN AEROBIC AND ANAEROBIC Blood Culture results may not be optimal due to an inadequate volume of blood received in culture bottles   Culture   Final    NO GROWTH 3 DAYS Performed at [facility], [address], [place], [place] [address]    Report Status PENDING  Incomplete  Culture, blood (routine x 2)     Status: None (Preliminary result)   Collection Time: 05/18/23  8:08 PM   Specimen: BLOOD  Result Value Ref Range Status   Specimen Description BLOOD SITE NOT SPECIFIED  Final   Special Requests   Final    BOTTLES  DRAWN AEROBIC AND ANAEROBIC Blood Culture results may not be optimal due to an inadequate volume of blood received in culture bottles   Culture   Final    NO GROWTH 3 DAYS Performed at [facility], [address], [place], [place] [address]    Report Status PENDING  Incomplete     Time coordinating discharge: Over 30 minutes  SIGNED:   Huey Bienenstock, MD  [facility] 05/21/2023, 2:33 PM Pager   If 7PM-7AM, please contact night-coverage [email or web address] Password [email or web address]

## 2023-05-21 NOTE — TOC Transition Note (Signed)
Transition of Care [facility]) - Discharge Note   Patient Details  Name: Breanna Webster MRN: [id number] Date of Birth: Aug 26, 1945  [facility]) CM/SW Contact:  Gordy Clement, RN Phone Number: 05/21/2023, 12:01 PM   Clinical Narrative:    Patient will DC to home today. [facility] will provide Home Health No DME needed AVS updated  Husband to transport           Patient Goals and CMS Choice            Discharge Placement                       Discharge Plan and Services Additional resources added to the After Visit Summary for                                       Social Drivers of Health (SDOH) Interventions SDOH Screenings   Food Insecurity: No Food Insecurity (05/02/2023)   Received from [facility]  Housing: Low Risk  (05/02/2023)   Received from [facility]  Transportation Needs: No Transportation Needs (05/02/2023)   Received from [facility]  Utilities: Not At Risk (05/02/2023)   Received from [facility]  Alcohol Screen: Low Risk  (08/29/2022)  Depression (PHQ2-9): Low Risk  (08/29/2022)  Financial Resource Strain: Patient Declined (12/09/2022)  Physical Activity: Unknown (12/09/2022)  Social Connections: Unknown (12/09/2022)  Stress: No Stress Concern Present (05/02/2023)   Received from [facility]  Tobacco Use: Low Risk  (05/15/2023)     Readmission Risk Interventions     No data to display

## 2023-05-21 NOTE — Care Management Important Message (Signed)
Important Message  Patient Details  Name: Breanna Webster MRN: [id number] Date of Birth: Jun 28, 1945   Important Message Given:  Yes - Medicare IM  Patient left prior to IM delivery will aend a copy to the patient home address   Dorena Bodo 05/21/2023, 2:27 PM

## 2023-05-22 ENCOUNTER — Telehealth: Payer: Self-pay

## 2023-05-22 DIAGNOSIS — I480 Paroxysmal atrial fibrillation: Secondary | ICD-10-CM | POA: Diagnosis not present

## 2023-05-22 DIAGNOSIS — I5032 Chronic diastolic (congestive) heart failure: Secondary | ICD-10-CM | POA: Diagnosis not present

## 2023-05-22 DIAGNOSIS — M109 Gout, unspecified: Secondary | ICD-10-CM | POA: Diagnosis not present

## 2023-05-22 DIAGNOSIS — A4151 Sepsis due to Escherichia coli [E. coli]: Secondary | ICD-10-CM | POA: Diagnosis not present

## 2023-05-22 DIAGNOSIS — M545 Low back pain, unspecified: Secondary | ICD-10-CM | POA: Diagnosis not present

## 2023-05-22 DIAGNOSIS — K219 Gastro-esophageal reflux disease without esophagitis: Secondary | ICD-10-CM | POA: Diagnosis not present

## 2023-05-22 DIAGNOSIS — K5730 Diverticulosis of large intestine without perforation or abscess without bleeding: Secondary | ICD-10-CM | POA: Diagnosis not present

## 2023-05-22 DIAGNOSIS — N179 Acute kidney failure, unspecified: Secondary | ICD-10-CM | POA: Diagnosis not present

## 2023-05-22 DIAGNOSIS — E119 Type 2 diabetes mellitus without complications: Secondary | ICD-10-CM | POA: Diagnosis not present

## 2023-05-22 DIAGNOSIS — M47816 Spondylosis without myelopathy or radiculopathy, lumbar region: Secondary | ICD-10-CM | POA: Diagnosis not present

## 2023-05-22 DIAGNOSIS — I110 Hypertensive heart disease with heart failure: Secondary | ICD-10-CM | POA: Diagnosis not present

## 2023-05-22 DIAGNOSIS — G8929 Other chronic pain: Secondary | ICD-10-CM | POA: Diagnosis not present

## 2023-05-22 DIAGNOSIS — G20C Parkinsonism, unspecified: Secondary | ICD-10-CM | POA: Diagnosis not present

## 2023-05-22 DIAGNOSIS — M8580 Other specified disorders of bone density and structure, unspecified site: Secondary | ICD-10-CM | POA: Diagnosis not present

## 2023-05-22 DIAGNOSIS — K7460 Unspecified cirrhosis of liver: Secondary | ICD-10-CM | POA: Diagnosis not present

## 2023-05-22 DIAGNOSIS — M47812 Spondylosis without myelopathy or radiculopathy, cervical region: Secondary | ICD-10-CM | POA: Diagnosis not present

## 2023-05-22 DIAGNOSIS — F32A Depression, unspecified: Secondary | ICD-10-CM | POA: Diagnosis not present

## 2023-05-22 DIAGNOSIS — N16 Renal tubulo-interstitial disorders in diseases classified elsewhere: Secondary | ICD-10-CM | POA: Diagnosis not present

## 2023-05-22 DIAGNOSIS — M47813 Spondylosis without myelopathy or radiculopathy, cervicothoracic region: Secondary | ICD-10-CM | POA: Diagnosis not present

## 2023-05-22 DIAGNOSIS — I2510 Atherosclerotic heart disease of native coronary artery without angina pectoris: Secondary | ICD-10-CM | POA: Diagnosis not present

## 2023-05-22 DIAGNOSIS — M47814 Spondylosis without myelopathy or radiculopathy, thoracic region: Secondary | ICD-10-CM | POA: Diagnosis not present

## 2023-05-22 DIAGNOSIS — M47815 Spondylosis without myelopathy or radiculopathy, thoracolumbar region: Secondary | ICD-10-CM | POA: Diagnosis not present

## 2023-05-22 DIAGNOSIS — F419 Anxiety disorder, unspecified: Secondary | ICD-10-CM | POA: Diagnosis not present

## 2023-05-22 DIAGNOSIS — M47818 Spondylosis without myelopathy or radiculopathy, sacral and sacrococcygeal region: Secondary | ICD-10-CM | POA: Diagnosis not present

## 2023-05-22 DIAGNOSIS — M47817 Spondylosis without myelopathy or radiculopathy, lumbosacral region: Secondary | ICD-10-CM | POA: Diagnosis not present

## 2023-05-22 NOTE — Transitions of Care (Post Inpatient/ED Visit) (Signed)
   05/22/2023  Name: Breanna Webster MRN: [id number] DOB: 04-15-1946  Today's TOC FU Call Status: Today's TOC FU Call Status:: Unsuccessful Call (1st Attempt) Unsuccessful Call (1st Attempt) Date: 05/22/23  Attempted to reach the patient regarding the most recent Inpatient/ED visit.  Follow Up Plan: Additional outreach attempts will be made to reach the patient to complete the Transitions of Care (Post Inpatient/ED visit) call.   Lonia Chimera, RN, BSN, CEN [employer]- Transition of Care Team.  [facility] [phone]

## 2023-05-22 NOTE — Telephone Encounter (Signed)
Copied from CRM [id number]. Topic: Clinical - Home Health Verbal Orders >> May 22, 2023  4:28 PM Truddie Crumble wrote: Caller/Agency: jim from [facility] Callback Number: [phone] Service Requested: Physical Therapy, skilled nursing evaluation, medical social worker evaluation and pending occupational evaluation Frequency: one week one, two week three and one week five (physical therapy) Any new concerns about the patient? Yes, patient has had four days without a bowel movement, poor appetite and poorly control anxiety in the evening. Patient has two different inulins she is on and at the home they are doing a sliding scale and their is specific units on the discharge. Patient potassium unit on the bottle does not have the same unit then on the discharge paper. Rosanne Ashing is concerned about the patient medication torsemide. Rosanne Ashing stated sometimes the patient does not take the full dose especially when they leave the home because it cause urination

## 2023-05-23 ENCOUNTER — Telehealth: Payer: Self-pay

## 2023-05-23 DIAGNOSIS — M545 Low back pain, unspecified: Secondary | ICD-10-CM | POA: Diagnosis not present

## 2023-05-23 DIAGNOSIS — M533 Sacrococcygeal disorders, not elsewhere classified: Secondary | ICD-10-CM | POA: Diagnosis not present

## 2023-05-23 LAB — CULTURE, BLOOD (ROUTINE X 2)
Culture: NO GROWTH
Culture: NO GROWTH

## 2023-05-23 NOTE — Telephone Encounter (Signed)
Please advise

## 2023-05-23 NOTE — Transitions of Care (Post Inpatient/ED Visit) (Signed)
   05/23/2023  Name: Breanna Webster MRN: [id number] DOB: Aug 13, 1945  Today's TOC FU Call Status: Today's TOC FU Call Status:: Unsuccessful Call (2nd Attempt) Unsuccessful Call (2nd Attempt) Date: 05/23/23  Attempted to reach the patient regarding the most recent Inpatient/ED visit.  Follow Up Plan: Additional outreach attempts will be made to reach the patient to complete the Transitions of Care (Post Inpatient/ED visit) call.   Lonia Chimera, RN, BSN, CEN [employer]- Transition of Care Team.  [facility] [phone]

## 2023-05-23 NOTE — Telephone Encounter (Signed)
Copied from CRM [id number]. Topic: Clinical - Home Health Verbal Orders >> May 23, 2023  9:56 AM Denese Killings wrote: Caller/Agency: Roselind Rily [facility]  Callback Number: [phone] Service Requested: Physical Therapy Frequency: N/A Any new concerns about the patient? No FYI several medicines were flagged for level 2 while administering to patient. Medications are Allopurinol, Tizanidine, Amitriptyline, Potassium Chloride, Colchicine, Diltiazen, Hydroxyzine with Potassium Chloride, and Potassium Chloride with Spironolactone.

## 2023-05-23 NOTE — Telephone Encounter (Signed)
Noted

## 2023-05-24 ENCOUNTER — Telehealth: Payer: Self-pay

## 2023-05-24 DIAGNOSIS — N179 Acute kidney failure, unspecified: Secondary | ICD-10-CM | POA: Diagnosis not present

## 2023-05-24 DIAGNOSIS — I110 Hypertensive heart disease with heart failure: Secondary | ICD-10-CM | POA: Diagnosis not present

## 2023-05-24 DIAGNOSIS — I5032 Chronic diastolic (congestive) heart failure: Secondary | ICD-10-CM | POA: Diagnosis not present

## 2023-05-24 DIAGNOSIS — I2510 Atherosclerotic heart disease of native coronary artery without angina pectoris: Secondary | ICD-10-CM | POA: Diagnosis not present

## 2023-05-24 DIAGNOSIS — N16 Renal tubulo-interstitial disorders in diseases classified elsewhere: Secondary | ICD-10-CM | POA: Diagnosis not present

## 2023-05-24 DIAGNOSIS — A4151 Sepsis due to Escherichia coli [E. coli]: Secondary | ICD-10-CM | POA: Diagnosis not present

## 2023-05-24 NOTE — Transitions of Care (Post Inpatient/ED Visit) (Signed)
05/24/2023  Name: Breanna Webster MRN: [id number] DOB: 07/04/1945  Today's TOC FU Call Status: Today's TOC FU Call Status:: Successful TOC FU Call Completed Patient's Name and Date of Birth confirmed.  Transition Care Management Follow-up Telephone Call Date of Discharge: 05/21/23 Discharge Facility: [facility]) Type of Discharge: Inpatient Admission Primary Inpatient Discharge Diagnosis:: Pyelonephritis How have you been since you were released from the hospital?: Better (Per patient's spouse) Any questions or concerns?: No  Items Reviewed: Did you receive and understand the discharge instructions provided?: Yes Medications obtained,verified, and reconciled?: Yes (Medications Reviewed) Any new allergies since your discharge?: No Dietary orders reviewed?: No Do you have support at home?: Yes People in Home: spouse Name of Support/Comfort Primary Source: [name]  Medications Reviewed Today: Medications Reviewed Today     Reviewed by Jodelle Gross, RN (Case Manager) on 05/24/23 at 1352  Med List Status: <None>   Medication Order Taking? Sig Documenting Provider Last Dose Status Informant  acetaminophen (TYLENOL) 500 MG tablet [id number] Yes Take 1 tablet (500 mg total) by mouth 4 (four) times daily. Arrien, York Ram, MD Taking Active Self, Spouse/Significant Other, Pharmacy Records  allopurinol (ZYLOPRIM) 100 MG tablet [id number] Yes Take 1 tablet (100 mg total) by mouth daily.  Taking Active Pharmacy Records, Self, Spouse/Significant Other  amitriptyline (ELAVIL) 25 MG tablet [id number] Yes Take 1 tablet (25 mg total) by mouth at bedtime.  Patient taking differently: Take 25 mg by mouth at bedtime as needed for sleep (Headaches).   Shirline Frees, NP Taking Active Pharmacy Records, Self, Spouse/Significant Other  apixaban (ELIQUIS) 5 MG TABS tablet [id number] Yes Take 1 tablet (5 mg total) by mouth 2 (two) times daily. Shirline Frees, NP Taking Active Pharmacy Records,  Self, Spouse/Significant Other           Med Note Sedonia Small May 20, 2023 12:28 PM) Unk time.   b complex vitamins capsule [id number] Yes Take 1 capsule by mouth daily. [provider] Taking Active Pharmacy Records, Self, Spouse/Significant Other  bisacodyl (DULCOLAX) 5 MG EC tablet [id number] Yes Take 1 Tablet with each dose pf pain medicine. Nafziger, Kandee Keen, NP Taking Active Self, Spouse/Significant Other, Pharmacy Records  cephALEXin (KEFLEX) 500 MG capsule [id number] Yes Take 1 capsule (500 mg total) by mouth 2 (two) times daily for 7 days. Elgergawy, Leana Roe, MD Taking Active   Cholecalciferol (D3 PO) [id number] Yes Take 1 tablet by mouth daily. [provider] Taking Active Pharmacy Records, Self, Spouse/Significant Other  colchicine 0.6 MG tablet [id number] No Take 1 tablet (0.6 mg total) by mouth daily. as needed for gout  Unknown Active Pharmacy Records, Self, Spouse/Significant Other           Med Note Sedonia Small May 20, 2023 12:31 PM) Has if needed. Unk last dose.   diltiazem (CARTIA XT) 120 MG 24 hr capsule [id number] Yes Take 1 capsule (120 mg total) by mouth daily. Ronney Asters, NP Taking Active Pharmacy Records, Self, Spouse/Significant Other  hydrOXYzine (VISTARIL) 25 MG capsule [id number] Yes Take 1 capsule (25 mg total) by mouth every 8 (eight) hours as needed. Nafziger, Kandee Keen, NP Taking Active Self, Spouse/Significant Other, Pharmacy Records  Insulin NPH, Human,, Isophane, (HUMULIN N KWIKPEN) 100 UNIT/ML Ulice Brilliant [id number] Yes Inject 20 Units into the skin in the morning and at bedtime. Decrease your dose to 10 units every morning, and 7 units every evening still appetite back to normal Elgergawy, Leana Roe, MD Taking Active  insulin regular (HUMULIN R) 100 units/mL injection [id number] Yes Please decrease your dose to 5 units 3 times daily before meals until appetite picks up back to normal Elgergawy, Leana Roe, MD Taking Active   metFORMIN  (GLUCOPHAGE-XR) 500 MG 24 hr tablet [id number] Yes Take 1 tablet by mouth 2 times daily  Taking Active Pharmacy Records, Self, Spouse/Significant Other  Multiple Vitamin (MULITIVITAMIN WITH MINERALS) TABS [id number] Yes Take 1 tablet by mouth daily. [provider] Taking Active Pharmacy Records, Self, Spouse/Significant Other  nitroGLYCERIN (NITROSTAT) 0.4 MG SL tablet [id number] No Place 1 tablet (0.4 mg total) under the tongue every 5 (five) minutes as needed for chest pain.  Patient not taking: Reported on 05/24/2023   Ronney Asters, NP Not Taking Active Self, Spouse/Significant Other, Pharmacy Records           Med Note Sedonia Small May 20, 2023 12:26 PM) Has never taken.   omeprazole (PRILOSEC) 20 MG capsule [id number] Yes Take 1 capsule (20 mg total) by mouth daily. [name], [name], PA-C Taking Active Self, Spouse/Significant Other, Pharmacy Records  ondansetron (ZOFRAN) 4 MG tablet [id number] No Take 1 tablet (4 mg total) by mouth every 8 (eight) hours as needed for nausea or vomiting. Sammuel Cooper, PA-C Unknown Active Self, Spouse/Significant Other, Pharmacy Records           Med Note Sedonia Small May 20, 2023 12:36 PM) Has if needed. Has not taken any.  oxyCODONE (OXY IR/ROXICODONE) 5 MG immediate release tablet [id number] Yes Take 5 mg by mouth every 6 (six) hours as needed for severe pain (pain score 7-10). [provider] Taking Active Self, Spouse/Significant Other, Pharmacy Records  polyethylene glycol (MIRALAX) 17 g packet [id number] No Take 17 g by mouth daily as needed. [name], Willaim Rayas, MD Unknown Active Pharmacy Records, Self, Spouse/Significant Other           Med Note Sedonia Small May 20, 2023 12:47 PM) Loreli Slot last dose.   potassium chloride SA (KLOR-CON M) 20 MEQ tablet [id number] Yes Take 0.5 tablets (10 mEq total) by mouth daily. Take in concurrence with torsemide.  Patient taking differently: Take 20 mEq by mouth daily.  Take in concurrence with torsemide.   Arrien, York Ram, MD Taking Active Self, Spouse/Significant Other, Pharmacy Records  spironolactone (ALDACTONE) 25 MG tablet [id number] Yes Take 0.5 tablets (12.5 mg total) by mouth daily. Elgergawy, Leana Roe, MD Taking Active   SYNTHROID 137 MCG tablet [id number] Yes Take 1 tablet (137 mcg total) by mouth every morning on empty stomach.  Taking Active Pharmacy Records, Self, Spouse/Significant Other  tiZANidine (ZANAFLEX) 4 MG tablet [id number] Yes Take 1 tablet (4 mg total) by mouth 3 (three) times daily.  Patient taking differently: Take 4 mg by mouth daily as needed for muscle spasms.    Taking Active Pharmacy Records, Self, Spouse/Significant Other  torsemide (DEMADEX) 20 MG tablet [id number] Yes Take 2 tablets (40 mg total) by mouth daily. Elgergawy, Leana Roe, MD Taking Active   Vibegron [facility]) 75 MG TABS [id number] Yes Take 75 mg by mouth at bedtime. [provider] Taking Active Pharmacy Records, Self, Spouse/Significant Other  vitamin E 180 MG (400 UNITS) capsule [id number] Yes Take 400 Units by mouth daily. [provider] Taking Active Pharmacy Records, Self, Spouse/Significant Other            Home Care and Equipment/Supplies: Were Home Health Services Ordered?: Yes Name of Home  Health Agency:: [facility] Has Agency set up a time to come to your home?: Yes First Home Health Visit Date: 05/24/23 Any new equipment or medical supplies ordered?: No  Functional Questionnaire: Do you need assistance with bathing/showering or dressing?: Yes Do you need assistance with meal preparation?: Yes Do you need assistance with eating?: No Do you have difficulty maintaining continence: No Do you need assistance with getting out of bed/getting out of a chair/moving?: Yes Do you have difficulty managing or taking your medications?: Yes  Follow up appointments reviewed: PCP Follow-up appointment confirmed?: No MD Provider Line  [phone] Given:  (Patient's spouse to call for appointment) Specialist Hospital Follow-up appointment confirmed?: Yes Date of Specialist follow-up appointment?: 05/27/23 Follow-Up Specialty Provider:: [facility] Do you need transportation to your follow-up appointment?: No Do you understand care options if your condition(s) worsen?: Yes-patient verbalized understanding  SDOH Interventions Today    Flowsheet Row Most Recent Value  SDOH Interventions   Food Insecurity Interventions Intervention Not Indicated  Housing Interventions Intervention Not Indicated  Transportation Interventions Intervention Not Indicated  Utilities Interventions Intervention Not Indicated      Jodelle Gross RN, BSN, CCM [place]  [facility] Manager Population Health Direct Dial: [phone]  Fax: [phone]

## 2023-05-24 NOTE — Telephone Encounter (Signed)
Lm for Breanna Webster on confidential vm.

## 2023-05-27 ENCOUNTER — Encounter: Payer: Self-pay | Admitting: Gastroenterology

## 2023-05-27 ENCOUNTER — Emergency Department (HOSPITAL_BASED_OUTPATIENT_CLINIC_OR_DEPARTMENT_OTHER)
Admission: EM | Admit: 2023-05-27 | Discharge: 2023-05-27 | Disposition: A | Discharge status: Home / Self Care | Payer: Medicare Other | Attending: Emergency Medicine | Admitting: Emergency Medicine

## 2023-05-27 ENCOUNTER — Emergency Department (HOSPITAL_BASED_OUTPATIENT_CLINIC_OR_DEPARTMENT_OTHER): Payer: Medicare Other | Admitting: Radiology

## 2023-05-27 ENCOUNTER — Other Ambulatory Visit: Payer: Self-pay

## 2023-05-27 ENCOUNTER — Encounter (HOSPITAL_BASED_OUTPATIENT_CLINIC_OR_DEPARTMENT_OTHER): Payer: Self-pay | Admitting: *Deleted

## 2023-05-27 ENCOUNTER — Emergency Department (EMERGENCY_DEPARTMENT_HOSPITAL)
Admission: EM | Admit: 2023-05-27 | Discharge: 2023-05-29 | Disposition: A | Discharge status: Home / Self Care | Payer: Medicare Other | Source: Home / Self Care | Attending: Emergency Medicine | Admitting: Emergency Medicine

## 2023-05-27 ENCOUNTER — Emergency Department (HOSPITAL_COMMUNITY): Payer: Medicare Other

## 2023-05-27 ENCOUNTER — Encounter (HOSPITAL_COMMUNITY): Payer: Self-pay

## 2023-05-27 ENCOUNTER — Other Ambulatory Visit (HOSPITAL_BASED_OUTPATIENT_CLINIC_OR_DEPARTMENT_OTHER): Payer: Self-pay

## 2023-05-27 ENCOUNTER — Ambulatory Visit (INDEPENDENT_AMBULATORY_CARE_PROVIDER_SITE_OTHER): Payer: Medicare Other | Admitting: Gastroenterology

## 2023-05-27 VITALS — BP 110/64 | HR 104

## 2023-05-27 DIAGNOSIS — I509 Heart failure, unspecified: Secondary | ICD-10-CM | POA: Diagnosis not present

## 2023-05-27 DIAGNOSIS — K7460 Unspecified cirrhosis of liver: Secondary | ICD-10-CM

## 2023-05-27 DIAGNOSIS — R4189 Other symptoms and signs involving cognitive functions and awareness: Secondary | ICD-10-CM | POA: Diagnosis present

## 2023-05-27 DIAGNOSIS — Z8616 Personal history of COVID-19: Secondary | ICD-10-CM | POA: Insufficient documentation

## 2023-05-27 DIAGNOSIS — Z8669 Personal history of other diseases of the nervous system and sense organs: Secondary | ICD-10-CM | POA: Diagnosis not present

## 2023-05-27 DIAGNOSIS — E119 Type 2 diabetes mellitus without complications: Secondary | ICD-10-CM | POA: Diagnosis not present

## 2023-05-27 DIAGNOSIS — Z794 Long term (current) use of insulin: Secondary | ICD-10-CM | POA: Diagnosis not present

## 2023-05-27 DIAGNOSIS — Z7984 Long term (current) use of oral hypoglycemic drugs: Secondary | ICD-10-CM | POA: Diagnosis not present

## 2023-05-27 DIAGNOSIS — Z96612 Presence of left artificial shoulder joint: Secondary | ICD-10-CM | POA: Diagnosis not present

## 2023-05-27 DIAGNOSIS — R0602 Shortness of breath: Secondary | ICD-10-CM | POA: Insufficient documentation

## 2023-05-27 DIAGNOSIS — Z7901 Long term (current) use of anticoagulants: Secondary | ICD-10-CM | POA: Diagnosis not present

## 2023-05-27 DIAGNOSIS — I110 Hypertensive heart disease with heart failure: Secondary | ICD-10-CM | POA: Diagnosis not present

## 2023-05-27 DIAGNOSIS — R079 Chest pain, unspecified: Secondary | ICD-10-CM | POA: Diagnosis not present

## 2023-05-27 DIAGNOSIS — F064 Anxiety disorder due to known physiological condition: Secondary | ICD-10-CM | POA: Diagnosis present

## 2023-05-27 DIAGNOSIS — Z79899 Other long term (current) drug therapy: Secondary | ICD-10-CM | POA: Diagnosis not present

## 2023-05-27 DIAGNOSIS — G20A1 Parkinson's disease without dyskinesia, without mention of fluctuations: Secondary | ICD-10-CM | POA: Insufficient documentation

## 2023-05-27 DIAGNOSIS — F419 Anxiety disorder, unspecified: Secondary | ICD-10-CM | POA: Insufficient documentation

## 2023-05-27 DIAGNOSIS — S42002A Fracture of unspecified part of left clavicle, initial encounter for closed fracture: Secondary | ICD-10-CM | POA: Diagnosis not present

## 2023-05-27 DIAGNOSIS — M549 Dorsalgia, unspecified: Secondary | ICD-10-CM | POA: Diagnosis not present

## 2023-05-27 DIAGNOSIS — I2510 Atherosclerotic heart disease of native coronary artery without angina pectoris: Secondary | ICD-10-CM | POA: Diagnosis not present

## 2023-05-27 DIAGNOSIS — F05 Delirium due to known physiological condition: Secondary | ICD-10-CM | POA: Diagnosis not present

## 2023-05-27 DIAGNOSIS — R410 Disorientation, unspecified: Secondary | ICD-10-CM

## 2023-05-27 DIAGNOSIS — Z20822 Contact with and (suspected) exposure to covid-19: Secondary | ICD-10-CM | POA: Insufficient documentation

## 2023-05-27 DIAGNOSIS — R519 Headache, unspecified: Secondary | ICD-10-CM | POA: Diagnosis not present

## 2023-05-27 DIAGNOSIS — Z8673 Personal history of transient ischemic attack (TIA), and cerebral infarction without residual deficits: Secondary | ICD-10-CM | POA: Diagnosis not present

## 2023-05-27 DIAGNOSIS — E039 Hypothyroidism, unspecified: Secondary | ICD-10-CM | POA: Diagnosis not present

## 2023-05-27 DIAGNOSIS — R4182 Altered mental status, unspecified: Secondary | ICD-10-CM | POA: Diagnosis not present

## 2023-05-27 DIAGNOSIS — R29818 Other symptoms and signs involving the nervous system: Secondary | ICD-10-CM | POA: Diagnosis not present

## 2023-05-27 LAB — URINALYSIS, ROUTINE W REFLEX MICROSCOPIC
Bilirubin Urine: NEGATIVE
Glucose, UA: NEGATIVE mg/dL
Hgb urine dipstick: NEGATIVE
Ketones, ur: NEGATIVE mg/dL
Leukocytes,Ua: NEGATIVE
Nitrite: NEGATIVE
Protein, ur: NEGATIVE mg/dL
Specific Gravity, Urine: 1.01 (ref 1.005–1.030)
pH: 6 (ref 5.0–8.0)

## 2023-05-27 LAB — COMPREHENSIVE METABOLIC PANEL
ALT: 27 U/L (ref 0–44)
AST: 38 U/L (ref 15–41)
Albumin: 3.9 g/dL (ref 3.5–5.0)
Alkaline Phosphatase: 67 U/L (ref 38–126)
Anion gap: 16 — ABNORMAL HIGH (ref 5–15)
BUN: 22 mg/dL (ref 8–23)
CO2: 18 mmol/L — ABNORMAL LOW (ref 22–32)
Calcium: 10.6 mg/dL — ABNORMAL HIGH (ref 8.9–10.3)
Chloride: 103 mmol/L (ref 98–111)
Creatinine, Ser: 1.11 mg/dL — ABNORMAL HIGH (ref 0.44–1.00)
GFR, Estimated: 51 mL/min — ABNORMAL LOW (ref >60)
Glucose, Bld: 166 mg/dL — ABNORMAL HIGH (ref 70–99)
Potassium: 3.5 mmol/L (ref 3.5–5.1)
Sodium: 137 mmol/L (ref 135–145)
Total Bilirubin: 1.4 mg/dL — ABNORMAL HIGH (ref 0.0–1.2)
Total Protein: 7.1 g/dL (ref 6.5–8.1)

## 2023-05-27 LAB — CBC
HCT: 32.2 % — ABNORMAL LOW (ref 36.0–46.0)
Hemoglobin: 10.1 g/dL — ABNORMAL LOW (ref 12.0–15.0)
MCH: 18.1 pg — ABNORMAL LOW (ref 26.0–34.0)
MCHC: 31.4 g/dL (ref 30.0–36.0)
MCV: 57.7 fL — ABNORMAL LOW (ref 80.0–100.0)
Platelets: 158 10*3/uL (ref 150–400)
RBC: 5.58 MIL/uL — ABNORMAL HIGH (ref 3.87–5.11)
RDW: 17.3 % — ABNORMAL HIGH (ref 11.5–15.5)
WBC: 6.8 10*3/uL (ref 4.0–10.5)
nRBC: 0 % (ref 0.0–0.2)

## 2023-05-27 LAB — RESP PANEL BY RT-PCR (RSV, FLU A&B, COVID)  RVPGX2
Influenza A by PCR: NEGATIVE
Influenza B by PCR: NEGATIVE
Resp Syncytial Virus by PCR: NEGATIVE
SARS Coronavirus 2 by RT PCR: NEGATIVE

## 2023-05-27 LAB — TROPONIN I (HIGH SENSITIVITY)
Troponin I (High Sensitivity): 44 ng/L — ABNORMAL HIGH (ref <18)
Troponin I (High Sensitivity): 46 ng/L — ABNORMAL HIGH (ref <18)

## 2023-05-27 LAB — CBG MONITORING, ED: Glucose-Capillary: 131 mg/dL — ABNORMAL HIGH (ref 70–99)

## 2023-05-27 LAB — BRAIN NATRIURETIC PEPTIDE: B Natriuretic Peptide: 187.8 pg/mL — ABNORMAL HIGH (ref 0.0–100.0)

## 2023-05-27 MED ORDER — LORAZEPAM 2 MG/ML IJ SOLN
1.0000 mg | Freq: Once | INTRAMUSCULAR | Status: AC
  Administered 2023-05-27: 1 mg via INTRAVENOUS
  Filled 2023-05-27: qty 1

## 2023-05-27 MED ORDER — B COMPLEX VITAMINS PO CAPS: 1.0000 | ORAL_CAPSULE | Freq: Every day | ORAL | Status: DC

## 2023-05-27 MED ORDER — TORSEMIDE 20 MG PO TABS
40.0000 mg | ORAL_TABLET | Freq: Every day | ORAL | Status: DC
  Administered 2023-05-28 – 2023-05-29 (×2): 40 mg via ORAL
  Filled 2023-05-27 (×2): qty 2

## 2023-05-27 MED ORDER — LORAZEPAM 2 MG/ML IJ SOLN
0.5000 mg | Freq: Once | INTRAMUSCULAR | Status: AC
  Administered 2023-05-28: 0.5 mg via INTRAVENOUS
  Filled 2023-05-27: qty 1

## 2023-05-27 MED ORDER — ADULT MULTIVITAMIN W/MINERALS CH
1.0000 | ORAL_TABLET | Freq: Every day | ORAL | Status: DC
  Administered 2023-05-28 – 2023-05-29 (×2): 1 via ORAL
  Filled 2023-05-27 (×2): qty 1

## 2023-05-27 MED ORDER — DIAZEPAM 2 MG PO TABS
2.0000 mg | ORAL_TABLET | Freq: Every day | ORAL | Status: DC | PRN
  Administered 2023-05-28 – 2023-05-29 (×2): 2 mg via ORAL
  Filled 2023-05-27 (×2): qty 1

## 2023-05-27 MED ORDER — ALLOPURINOL 100 MG PO TABS
100.0000 mg | ORAL_TABLET | Freq: Every day | ORAL | Status: DC
  Administered 2023-05-28 – 2023-05-29 (×2): 100 mg via ORAL
  Filled 2023-05-27 (×2): qty 1

## 2023-05-27 MED ORDER — INSULIN ASPART 100 UNIT/ML IJ SOLN
5.0000 [IU] | Freq: Three times a day (TID) | INTRAMUSCULAR | Status: DC
  Administered 2023-05-28 – 2023-05-29 (×2): 5 [IU] via SUBCUTANEOUS

## 2023-05-27 MED ORDER — DIAZEPAM 2 MG PO TABS
2.0000 mg | ORAL_TABLET | Freq: Every day | ORAL | 0 refills | Status: DC | PRN
  Filled 2023-05-27: qty 10, 10d supply, fill #0

## 2023-05-27 MED ORDER — POLYETHYLENE GLYCOL 3350 17 G PO PACK: 17.0000 g | PACK | Freq: Every day | ORAL | Status: DC | PRN

## 2023-05-27 MED ORDER — POTASSIUM CHLORIDE CRYS ER 20 MEQ PO TBCR
20.0000 meq | EXTENDED_RELEASE_TABLET | Freq: Every day | ORAL | Status: DC
  Administered 2023-05-28 – 2023-05-29 (×2): 20 meq via ORAL
  Filled 2023-05-27 (×2): qty 1

## 2023-05-27 MED ORDER — LIDOCAINE 5 % EX PTCH
1.0000 | MEDICATED_PATCH | Freq: Once | CUTANEOUS | Status: DC
  Administered 2023-05-27: 1 via TRANSDERMAL
  Filled 2023-05-27: qty 1

## 2023-05-27 MED ORDER — QUETIAPINE FUMARATE 25 MG PO TABS
25.0000 mg | ORAL_TABLET | Freq: Once | ORAL | Status: AC
Start: 2023-05-27 — End: 2023-05-27
  Administered 2023-05-27: 25 mg via ORAL
  Filled 2023-05-27: qty 1

## 2023-05-27 MED ORDER — APIXABAN 5 MG PO TABS
5.0000 mg | ORAL_TABLET | Freq: Two times a day (BID) | ORAL | Status: DC
  Administered 2023-05-28 – 2023-05-29 (×3): 5 mg via ORAL
  Filled 2023-05-27 (×3): qty 1

## 2023-05-27 MED ORDER — DILTIAZEM HCL ER COATED BEADS 120 MG PO CP24
120.0000 mg | ORAL_CAPSULE | Freq: Every day | ORAL | Status: DC
  Administered 2023-05-28 – 2023-05-29 (×2): 120 mg via ORAL
  Filled 2023-05-27 (×3): qty 1

## 2023-05-27 MED ORDER — METFORMIN HCL ER 500 MG PO TB24
500.0000 mg | ORAL_TABLET | Freq: Two times a day (BID) | ORAL | Status: DC
  Administered 2023-05-28 – 2023-05-29 (×3): 500 mg via ORAL
  Filled 2023-05-27 (×6): qty 1

## 2023-05-27 MED ORDER — OXYCODONE HCL 5 MG PO TABS
5.0000 mg | ORAL_TABLET | Freq: Four times a day (QID) | ORAL | Status: DC | PRN
  Administered 2023-05-28 – 2023-05-29 (×2): 5 mg via ORAL
  Filled 2023-05-27 (×2): qty 1

## 2023-05-27 MED ORDER — HALOPERIDOL LACTATE 5 MG/ML IJ SOLN
2.5000 mg | Freq: Once | INTRAMUSCULAR | Status: AC
  Administered 2023-05-27: 2.5 mg via INTRAMUSCULAR
  Filled 2023-05-27: qty 1

## 2023-05-27 MED ORDER — SPIRONOLACTONE 12.5 MG HALF TABLET
12.5000 mg | ORAL_TABLET | Freq: Every day | ORAL | Status: DC
  Administered 2023-05-28 – 2023-05-29 (×2): 12.5 mg via ORAL
  Filled 2023-05-27 (×2): qty 1

## 2023-05-27 MED ORDER — DIAZEPAM 5 MG PO TABS
5.0000 mg | ORAL_TABLET | Freq: Once | ORAL | Status: AC
  Administered 2023-05-27: 5 mg via ORAL
  Filled 2023-05-27: qty 1

## 2023-05-27 MED ORDER — AMITRIPTYLINE HCL 25 MG PO TABS
25.0000 mg | ORAL_TABLET | Freq: Every evening | ORAL | Status: DC | PRN
  Administered 2023-05-29: 25 mg via ORAL
  Filled 2023-05-27: qty 1

## 2023-05-27 MED ORDER — LEVOTHYROXINE SODIUM 25 MCG PO TABS
137.0000 ug | ORAL_TABLET | Freq: Every morning | ORAL | Status: DC
  Administered 2023-05-28 – 2023-05-29 (×2): 137 ug via ORAL
  Filled 2023-05-27 (×2): qty 1

## 2023-05-27 MED ORDER — PANTOPRAZOLE SODIUM 40 MG PO TBEC
40.0000 mg | DELAYED_RELEASE_TABLET | Freq: Every day | ORAL | Status: DC
  Administered 2023-05-28 – 2023-05-29 (×2): 40 mg via ORAL
  Filled 2023-05-27 (×2): qty 1

## 2023-05-27 MED ORDER — LEVOTHYROXINE SODIUM 25 MCG PO TABS: 137.0000 ug | ORAL_TABLET | Freq: Every morning | ORAL | Status: DC

## 2023-05-27 MED ORDER — VITAMIN E 45 MG (100 UNIT) PO CAPS: 400.0000 [IU] | ORAL_CAPSULE | Freq: Every day | ORAL | Status: DC

## 2023-05-27 MED ORDER — ONDANSETRON HCL 4 MG PO TABS: 4.0000 mg | ORAL_TABLET | Freq: Three times a day (TID) | ORAL | Status: DC | PRN

## 2023-05-27 MED ORDER — INSULIN ISOPHANE HUMAN 100 UNIT/ML KWIKPEN: 10.0000 [IU] | PEN_INJECTOR | Freq: Two times a day (BID) | SUBCUTANEOUS | Status: DC

## 2023-05-27 MED ORDER — BISACODYL 5 MG PO TBEC: 5.0000 mg | DELAYED_RELEASE_TABLET | Freq: Every day | ORAL | Status: DC | PRN

## 2023-05-27 NOTE — ED Provider Notes (Signed)
[facility] AT [facility] Provider Note   CSN: [id number] Arrival date & time: 05/27/23  1503     History  Chief Complaint  Patient presents with   Shortness of Breath    Breanna Webster is a 78 y.o. female.  78 year old female with prior medical history as detailed below presents for evaluation.  Patient was seen at [facility] this morning for similar complaint.  Patient with longstanding history of significant anxiety.  After Valium administration this morning she felt improved.  On my evaluation she is distraught and anxious.  She and her family are requesting something for anxiety.  She has used Atarax at home without significant improvement in her anxiety symptoms.  The history is provided by the patient and medical records.       Home Medications Prior to Admission medications   Medication Sig Start Date End Date Taking? Authorizing Provider  acetaminophen (TYLENOL) 500 MG tablet Take 1 tablet (500 mg total) by mouth 4 (four) times daily. 04/09/23   Arrien, York Ram, MD  allopurinol (ZYLOPRIM) 100 MG tablet Take 1 tablet (100 mg total) by mouth daily. 02/28/23     amitriptyline (ELAVIL) 25 MG tablet Take 1 tablet (25 mg total) by mouth at bedtime. Patient taking differently: Take 25 mg by mouth at bedtime as needed for sleep (Headaches). 01/17/23   Nafziger, Kandee Keen, NP  apixaban (ELIQUIS) 5 MG TABS tablet Take 1 tablet (5 mg total) by mouth 2 (two) times daily. 02/21/23   Shirline Frees, NP  b complex vitamins capsule Take 1 capsule by mouth daily.    [provider]  bisacodyl (DULCOLAX) 5 MG EC tablet Take 1 Tablet with each dose pf pain medicine. 05/15/23   Shirline Frees, NP  cephALEXin (KEFLEX) 500 MG capsule Take 1 capsule (500 mg total) by mouth 2 (two) times daily for 7 days. 05/21/23 05/28/23  Elgergawy, Leana Roe, MD  Cholecalciferol (D3 PO) Take 1 tablet by mouth daily.    [provider]  colchicine 0.6 MG  tablet Take 1 tablet (0.6 mg total) by mouth daily. as needed for gout 02/28/23     diazepam (VALIUM) 2 MG tablet Take 1 tablet (2 mg total) by mouth daily as needed for up to 10 doses for anxiety. 05/27/23   Sloan Leiter, DO  diltiazem (CARTIA XT) 120 MG 24 hr capsule Take 1 capsule (120 mg total) by mouth daily. 02/03/23   Ronney Asters, NP  hydrOXYzine (VISTARIL) 25 MG capsule Take 1 capsule (25 mg total) by mouth every 8 (eight) hours as needed. 05/15/23   Nafziger, Kandee Keen, NP  Insulin NPH, Human,, Isophane, (HUMULIN N KWIKPEN) 100 UNIT/ML Kiwkpen Inject 20 Units into the skin in the morning and at bedtime. Decrease your dose to 10 units every morning, and 7 units every evening still appetite back to normal 05/21/23   Elgergawy, Leana Roe, MD  insulin regular (HUMULIN R) 100 units/mL injection Please decrease your dose to 5 units 3 times daily before meals until appetite picks up back to normal 05/21/23   Elgergawy, Leana Roe, MD  metFORMIN (GLUCOPHAGE-XR) 500 MG 24 hr tablet Take 1 tablet by mouth 2 times daily 08/29/22     Multiple Vitamin (MULITIVITAMIN WITH MINERALS) TABS Take 1 tablet by mouth daily.    [provider]  nitroGLYCERIN (NITROSTAT) 0.4 MG SL tablet Place 1 tablet (0.4 mg total) under the tongue every 5 (five) minutes as needed for chest pain. 05/15/23  Ronney Asters, NP  omeprazole (PRILOSEC) 20 MG capsule Take 1 capsule (20 mg total) by mouth daily. 04/04/23   [name], [name], PA-C  ondansetron (ZOFRAN) 4 MG tablet Take 1 tablet (4 mg total) by mouth every 8 (eight) hours as needed for nausea or vomiting. 04/04/23   [name], [name], PA-C  oxyCODONE (OXY IR/ROXICODONE) 5 MG immediate release tablet Take 5 mg by mouth every 6 (six) hours as needed for severe pain (pain score 7-10).    [provider]  polyethylene glycol (MIRALAX) 17 g packet Take 17 g by mouth daily as needed. 12/19/22   [name], Willaim Rayas, MD  potassium chloride SA (KLOR-CON M) 20 MEQ tablet Take  0.5 tablets (10 mEq total) by mouth daily. Take in concurrence with torsemide. Patient taking differently: Take 20 mEq by mouth daily. Take in concurrence with torsemide. 04/09/23   Arrien, York Ram, MD  spironolactone (ALDACTONE) 25 MG tablet Take 0.5 tablets (12.5 mg total) by mouth daily. 05/24/23 06/23/23  Elgergawy, Leana Roe, MD  SYNTHROID 137 MCG tablet Take 1 tablet (137 mcg total) by mouth every morning on empty stomach. 01/24/23     tiZANidine (ZANAFLEX) 4 MG tablet Take 1 tablet (4 mg total) by mouth 3 (three) times daily. Patient taking differently: Take 4 mg by mouth daily as needed for muscle spasms. 01/09/23     torsemide (DEMADEX) 20 MG tablet Take 2 tablets (40 mg total) by mouth daily. 05/24/23   Elgergawy, Leana Roe, MD  Vibegron (GEMTESA) 75 MG TABS Take 75 mg by mouth at bedtime.    [provider]  vitamin E 180 MG (400 UNITS) capsule Take 400 Units by mouth daily.    [provider]      Allergies    Statins    Review of Systems   Review of Systems  All other systems reviewed and are negative.   Physical Exam Updated Vital Signs BP 127/74   Pulse 95   Temp (!) 97.4 F (36.3 C) (Oral)   Resp 20   Ht 5\' 5"  (1.651 m)   Wt 77.2 kg   SpO2 100%   BMI 28.32 kg/m  Physical Exam Vitals and nursing note reviewed.  Constitutional:      General: She is not in acute distress.    Appearance: Normal appearance. She is well-developed.  HENT:     Head: Normocephalic and atraumatic.  Eyes:     Conjunctiva/sclera: Conjunctivae normal.     Pupils: Pupils are equal, round, and reactive to light.  Cardiovascular:     Rate and Rhythm: Normal rate and regular rhythm.     Heart sounds: Normal heart sounds.  Pulmonary:     Effort: Pulmonary effort is normal. No respiratory distress.     Breath sounds: Normal breath sounds.  Abdominal:     General: There is no distension.     Palpations: Abdomen is soft.     Tenderness: There is no abdominal tenderness.   Musculoskeletal:        General: No deformity. Normal range of motion.     Cervical back: Normal range of motion and neck supple.  Skin:    General: Skin is warm and dry.  Neurological:     General: No focal deficit present.     Mental Status: She is alert and oriented to person, place, and time.     ED Results / Procedures / Treatments   Labs (all labs ordered are listed, but only abnormal  results are displayed) Labs Reviewed  CBC - Abnormal; Notable for the following components:      Result Value   RBC 5.58 (*)    Hemoglobin 10.1 (*)    HCT 32.2 (*)    MCV 57.7 (*)    MCH 18.1 (*)    RDW 17.3 (*)    All other components within normal limits  COMPREHENSIVE METABOLIC PANEL - Abnormal; Notable for the following components:   CO2 18 (*)    Glucose, Bld 166 (*)    Creatinine, Ser 1.11 (*)    Calcium 10.6 (*)    Total Bilirubin 1.4 (*)    GFR, Estimated 51 (*)    Anion gap 16 (*)    All other components within normal limits  BRAIN NATRIURETIC PEPTIDE - Abnormal; Notable for the following components:   B Natriuretic Peptide 187.8 (*)    All other components within normal limits  CBG MONITORING, ED - Abnormal; Notable for the following components:   Glucose-Capillary 131 (*)    All other components within normal limits  TROPONIN I (HIGH SENSITIVITY) - Abnormal; Notable for the following components:   Troponin I (High Sensitivity) 44 (*)    All other components within normal limits  TROPONIN I (HIGH SENSITIVITY) - Abnormal; Notable for the following components:   Troponin I (High Sensitivity) 46 (*)    All other components within normal limits  RESP PANEL BY RT-PCR (RSV, FLU A&B, COVID)  RVPGX2  URINALYSIS, ROUTINE W REFLEX MICROSCOPIC    EKG EKG Interpretation Date/Time:  Monday May 27 2023 15:26:04 EST Ventricular Rate:  93 PR Interval:  188 QRS Duration:  162 QT Interval:  440 QTC Calculation: 547 R Axis:   -63  Text Interpretation: Normal sinus rhythm  Biatrial enlargement Left axis deviation Left bundle branch block Abnormal ECG When compared with ECG of 19-May-2023 19:00, PREVIOUS ECG IS PRESENT Confirmed by Kristine Royal [phone]) on 05/27/2023 5:40:22 PM  Radiology DG Chest 2 View Result Date: 05/27/2023 CLINICAL DATA:  Shortness of breath.  Panic attacks and back pain. EXAM: CHEST - 2 VIEW COMPARISON:  04/04/2023 FINDINGS: Cardiac enlargement. Cardiac valve prosthesis. Calcification of the aorta. Lungs are clear. No pleural effusions. No pneumothorax. Mediastinal contours appear intact. Calcification of the aorta. Degenerative changes in the spine. Postoperative reverse left shoulder arthroplasty. Old ununited fracture of the left clavicle. IMPRESSION: Cardiac enlargement.  No evidence of active pulmonary disease. Electronically Signed   By: Burman Nieves M.D.   On: 05/27/2023 17:09    Procedures Procedures    Medications Ordered in ED Medications  diazepam (VALIUM) tablet 5 mg (5 mg Oral Given 05/27/23 1805)  LORazepam (ATIVAN) injection 1 mg (1 mg Intravenous Given 05/27/23 1935)  haloperidol lactate (HALDOL) injection 2.5 mg (2.5 mg Intramuscular Given 05/27/23 2042)    ED Course/ Medical Decision Making/ A&P                                 Medical Decision Making Risk OTC drugs. Prescription drug management.    Medical Screen Complete  This patient presented to the ED with complaint of anxiety.  This complaint involves an extensive number of treatment options. The initial differential diagnosis includes, but is not limited to, anxiety, metabolic abnormality, viral versus bacterial infection, etc.  This presentation is: Acute, Chronic, Self-Limited, Previously Undiagnosed, Uncertain Prognosis, Complicated, Systemic Symptoms, and Threat to Life/Bodily Function  Patient with multiple comorbidities who  presents with apparent increased anxiety and agitation.  Patient seen earlier today at [facility] ED and was given p.o. Valium  with some improvement in her symptoms.  Her symptoms returned again this evening and her family brought her to this facility for evaluation.  Patient administered both Ativan and Haldol with improvement in her level anxiety and agitation.  She appears to be much improved on reevaluation.  Patient screening labs including CBC, CMP, troponin, COVID, flu, RSV are on the whole without significant acute abnormality.  Chest x-ray does not show acute pathology.  UA without evidence of UTI.  Patient to be held in the ED pending TTS/Geri psych evaluation.  Oncoming ED provider -Dr. Manus Gunning -aware of case.  Spouse of patient attempted to be contacted by phone to update on plan of care.  His phone mailbox was full.  Per the nursing staff and the patient and the patient's family has gone home.     Additional history obtained:  Additional history obtained from Spouse and Family External records from outside sources obtained and reviewed including prior ED visits and prior Inpatient records.    Lab Tests:  I ordered and personally interpreted labs.  The pertinent results include: CBC, CMP, BNP, CBG, troponin, COVID, flu, UA   Imaging Studies ordered:  I ordered imaging studies including chest x-ray I independently visualized and interpreted obtained imaging which showed NAD I agree with the radiologist interpretation.   Cardiac Monitoring:  The patient was maintained on a cardiac monitor.  I personally viewed and interpreted the cardiac monitor which showed an underlying rhythm of: NSR   Medicines ordered:  I ordered medication including Ativan, Haldol, Seroquel for agitation Reevaluation of the patient after these medicines showed that the patient: improved   Problem List / ED Course:  Agitation, anxiety   Reevaluation:  After the interventions noted above, I reevaluated the patient and found that they have: improved    Disposition:  After consideration of the  diagnostic results and the patients response to treatment, I feel that the patent would benefit from TTS/Geri psych evaluation.          Final Clinical Impression(s) / ED Diagnoses Final diagnoses:  Anxiety    Rx / DC Orders ED Discharge Orders     None         Wynetta Fines, MD 05/27/23 2353

## 2023-05-27 NOTE — Discharge Instructions (Addendum)
It was a pleasure caring for you today in the emergency department.  Please call your PCP later today to arrange follow-up.  Please attempt to follow-up with your neurologist.  Your symptoms today could be related to your diagnosis of Parkinson's disease and may be progression of this underlying disease process.  Please return to the emergency department for any worsening or worrisome symptoms.

## 2023-05-27 NOTE — Progress Notes (Signed)
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    I last saw her in August although she was seen by my colleague Amy Monica Becton in December.  She has not been doing well at all since I have last seen her.  Recall last year she had a major shoulder surgery and clavicle fracture repair in February, took a long time to recover from this.  She has had some pain management issues and narcotics for that.  She also has bad arthritis of her knees.  She was admitted to the hospital for several days in the end of January for UTI/pyelonephritis related to E. coli infection.  From what I can gather she has not had issues with variceal bleeding, ascites, jaundice.  Husband and son are present during the visit today.  The patient has been complaining of shortness of breath and dyspnea for several weeks now.  Husband states at least 4 to 6 weeks she has been complaining of shortness of breath.  She is extremely anxious in the office today, states she cannot catch her breath or relax.  She was seen in the ED last night and reportedly given some Valium which relaxed her and allowed her to breathe better however she states as soon as she got home her symptoms recurred and she has not been able to breathe.  She is extremely anxious, inconsolable, asking for fentanyl in the office today.  States she cannot go home and feel like this however also states she cannot go to the hospital in her current state as well.  She has not seen her cardiologist for some time, she did have an echo in December which looked okay.  She has had a few CT scans of her  chest and abdomen pelvis in recent months, she has ruled out for PE.  See imaging studies in both epic and Care Everywhere.  She remains on anticoagulation.  Husband is concerned about Parkinson's and if she is developing dementia, sundowning.  States he has not been able to cope with her medical problems recently at the house, called their sundown last night who drove in from [place].  They are having a very hard time managing the patient in her current state.  They are not sure if it is due to anxiety/panic attacks and what is causing this recurrent sensation of inability to breathe.  Her recent CT imaging has showed no hepatomas.  Her endoscopy is up-to-date without any evidence of varices.  She has an elevated INR but that is in the setting of Eliquis, not able to keep track of that.  She has not had ammonia levels checked, she does have some constipation at baseline.  We discussed options. She does not drink any alcohol.  She denies any family history of cirrhosis.     Endoscopic history: EGD 02/03/19 - irregular zline, biopsies c/w nondysplastic BE, loose nissen, no varices, benign duodenal polyp Colonoscopy 02/03/19 - 8 small adenomas, diverticulosis, lipoma EGD 03/20/2017 - irregular z line, LA grade B esophagitis, small paraesophageal hernia, loose Nissen wrap, mild gastritis, duodenal erosions - path c/w Barrett's, H pylori negative  Colonoscopy 03/20/2017 - pancolonic diverticulosis,  28 polyps removed -  EGD 2002 - suspected segment of BE, no BE on biopsies EGD 2006 - esophagitis and gastritis, no evidence of BE Colonoscopy 5/05 - diverticulosis Colonoscopy 4/15 - 4 small adenomas  RUQ [name] 10/18/21: IMPRESSION: 1. Cirrhotic morphology to the liver with coarsened echotexture and a mildly nodular contour. 2. No other abnormalities.     Echo 03/21/21: EF 60-65%, mild LVH, grade I DD   EGD 03/07/2022 - normal esophagus without evidence of Barrett's esophagus and no evidence of  esophageal or gastric varices.  Prior Nissan fundoplication was intact.  A repeat EGD in a few years was considered.     Colonoscopy 03/07/22: 6 tubular adenomatous/hyperplastic polyps removed from the colon, diverticulosis throughout the colon and internal hemorrhoids.  No further colon polyp surveillance colonoscopies were recommended due to age.     1. Surgical [P], GE junction - SUPERFICIAL FRAGMENT OF GASTRIC CARDIAC MUCOSA WITH NO SIGNIFICANT PATHOLOGIC CHANGES - NEGATIVE FOR INTESTINAL METAPLASIA, DYSPLASIA OR MALIGNANCY 2. Surgical [P], colon, transverse x 4 and cecum x 1, descending x 1, polyp (6) - TUBULAR ADENOMA(S). - NO HIGH GRADE DYSPLASIA OR MALIGNANCY. - HYPERPLASTIC POLYP(S). - NO DYSPLASIA OR MALIGNANCY. 3. Surgical [P], random sites colon - BENIGN COLONIC MUCOSA WITH NO SPECIFIC PATHOLOGIC CHANGES - NEGATIVE FOR INCREASED INTRAEPITHELIAL LYMPHOCYTES OR THICKENED SUBEPITHELIAL COLLAGEN TABLE - NEGATIVE FOR DYSPLASIA OR MALIGNANCY     RUQ [name] 10/26/22: IMPRESSION: Morphologic changes compatible with cirrhosis. No focal lesion.    Echo 04/06/23 - EF 50-55%, grade II DD  CT PE protocol 04/05/23: IMPRESSION: No evidence of pulmonary embolus. Cardiomegaly, vascular congestion. Coronary artery disease. Ground-glass opacities in the lower lobes/basis, favor atelectasis although infiltrate/pneumonia cannot be completely excluded. Trace left pleural effusion. Aortic Atherosclerosis (ICD10-I70.0).     CT chest abdomen / pelvis 05/01/23: IMPRESSION:  No pneumothorax. No pulmonary contusion. No acute infiltrate or pleural effusion.  Coronary artery calcifications.  Small hiatal hernia.  No evidence of visceral injury or free fluid throughout the abdomen with pelvis.  Simple cyst in the left kidney.  Diverticuli coli without acute diverticulitis.  Distended urinary bladder which can be seen with urinary bladder outlet obstruction.  Leiomyomatous uterus.    CT abdomen  / pelvis 05/18/23: IMPRESSION: No acute findings. Colonic diverticulosis, without radiographic evidence of diverticulitis. Small uterine fibroid. Probable hepatic cirrhosis.  Labs 05/20/23 - baseline anemia, plt 138, LFTs okay,  AFP due in June INR in June      Past Medical History:  Diagnosis Date   Anxiety    Arthritis    "back" (04/22/2018)   Atrial fibrillation with RVR (HCC) 10/21/2018   Back pain    Bacteremia due to Streptococcus Salivarius 10/07/2018   Blood transfusion without reported diagnosis    CAD (coronary artery disease)    a. 03/2018 s/p PCI/DES to the RCA (3.0x15 Onyx DES).   Carotid artery stenosis    Mild   Cerebellar stroke, acute (HCC) 10/21/2018   Cerebrovascular accident (CVA) due to embolism of right posterior cerebral artery (HCC) 08/03/2020   Chest pain    CHF (congestive heart failure) (HCC)    Chronic lower back pain    Cirrhosis (HCC)    Colon polyps    Diverticulitis    Diverticulosis    Esophageal thickening    seen on pre TAVR CT scan, also questionable cirrhosis. MRI recommended. Will refer to GI   GERD (gastroesophageal reflux disease)    Gout    Grave's disease  H/O total shoulder replacement, left 06/01/2022   Heart murmur    History of cardioembolic stroke 03/29/2020   History of colonic polyps 05/22/2017   History of hiatal hernia    Hyperlipidemia    Hypertension    Hypothyroidism    IBS (irritable bowel syndrome)    Osteopenia    Parkinson's syndrome (HCC) 04/23/2020   Pulmonary nodules    seen on pre TAVR CT. likley benign. no follow up recommended if pt low risk.   S/P TAVR (transcatheter aortic valve replacement)    Shortness of breath on exertion    Stroke (HCC) 2021   x2   Thalassemia minor    Thyroid disease    Type II diabetes mellitus (HCC)      Past Surgical History:  Procedure Laterality Date   76 HOUR PH STUDY N/A 03/03/2018   Procedure: 24 HOUR PH STUDY;  Surgeon: Napoleon Form, MD;   Location: [facility] END[facility];  Service: Endoscopy;  Laterality: N/A;   COLONOSCOPY  2023   COLONOSCOPY W/ BIOPSIES AND POLYPECTOMY     CORONARY ANGIOGRAPHY Right 04/21/2018   Procedure: CORONARY ANGIOGRAPHY (CATH LAB);  Surgeon: Lyn Records, MD;  Location: [facility] INVASIVE CV LAB;  Service: Cardiovascular;  Laterality: Right;   CORONARY STENT INTERVENTION N/A 04/22/2018   Procedure: CORONARY STENT INTERVENTION;  Surgeon: Lyn Records, MD;  Location: [facility] INVASIVE CV LAB;  Service: Cardiovascular;  Laterality: N/A;   DILATION AND CURETTAGE OF UTERUS     ESOPHAGEAL MANOMETRY N/A 03/03/2018   Procedure: ESOPHAGEAL MANOMETRY (EM);  Surgeon: Napoleon Form, MD;  Location: [facility];  Service: Endoscopy;  Laterality: N/A;   HYSTEROSCOPY     fibroids   LAPAROSCOPIC CHOLECYSTECTOMY  1985   LAPAROSCOPY     fibroids   NISSEN FUNDOPLICATION  1990s   POLYPECTOMY     REVERSE SHOULDER ARTHROPLASTY Left 06/01/2022   Procedure: REVERSE SHOULDER ARTHROPLASTY;  Surgeon: Beverely Low, MD;  Location: [facility] ORS;  Service: Orthopedics;  Laterality: Left;  choice with interscalene block   RIGHT/LEFT HEART CATH AND CORONARY ANGIOGRAPHY N/A 02/20/2018   Procedure: RIGHT/LEFT HEART CATH AND CORONARY ANGIOGRAPHY;  Surgeon: Lyn Records, MD;  Location: [facility] INVASIVE CV LAB;  Service: Cardiovascular;  Laterality: N/A;   TEE WITHOUT CARDIOVERSION N/A 07/08/2018   Procedure: TRANSESOPHAGEAL ECHOCARDIOGRAM (TEE);  Surgeon: Kathleene Hazel, MD;  Location: [facility] OR;  Service: Open Heart Surgery;  Laterality: N/A;   TEE WITHOUT CARDIOVERSION  10/07/2018   TEE WITHOUT CARDIOVERSION N/A 10/07/2018   Procedure: TRANSESOPHAGEAL ECHOCARDIOGRAM (TEE);  Surgeon: Jake Bathe, MD;  Location: [facility] ENDOSCOPY;  Service: Cardiovascular;  Laterality: N/A;   TONSILLECTOMY     age 61   TRANSCATHETER AORTIC VALVE REPLACEMENT, TRANSFEMORAL N/A 07/08/2018   Procedure: TRANSCATHETER AORTIC VALVE REPLACEMENT, TRANSFEMORAL;  Surgeon:  Kathleene Hazel, MD;  Location: [facility] OR;  Service: Open Heart Surgery;  Laterality: N/A;   Family History  Adopted: Yes  Problem Relation Age of Onset   Healthy Son        x 2   Headache Other        Cluster headaches   Heart failure Mother    Colon cancer Neg Hx    Pancreatic cancer Neg Hx    Rectal cancer Neg Hx    Stomach cancer Neg Hx    Social History   Tobacco Use   Smoking status: Never    Passive exposure: Never   Smokeless tobacco: Never  Vaping Use   Vaping  status: Never Used  Substance Use Topics   Alcohol use: No    Alcohol/week: 0.0 standard drinks of alcohol   Drug use: Never   Current Outpatient Medications  Medication Sig Dispense Refill   acetaminophen (TYLENOL) 500 MG tablet Take 1 tablet (500 mg total) by mouth 4 (four) times daily. 30 tablet 0   allopurinol (ZYLOPRIM) 100 MG tablet Take 1 tablet (100 mg total) by mouth daily. 90 tablet 1   amitriptyline (ELAVIL) 25 MG tablet Take 1 tablet (25 mg total) by mouth at bedtime. (Patient taking differently: Take 25 mg by mouth at bedtime as needed for sleep (Headaches).) 90 tablet 1   apixaban (ELIQUIS) 5 MG TABS tablet Take 1 tablet (5 mg total) by mouth 2 (two) times daily. 60 tablet 3   b complex vitamins capsule Take 1 capsule by mouth daily.     bisacodyl (DULCOLAX) 5 MG EC tablet Take 1 Tablet with each dose pf pain medicine. 100 tablet 1   cephALEXin (KEFLEX) 500 MG capsule Take 1 capsule (500 mg total) by mouth 2 (two) times daily for 7 days. 14 capsule 0   Cholecalciferol (D3 PO) Take 1 tablet by mouth daily.     colchicine 0.6 MG tablet Take 1 tablet (0.6 mg total) by mouth daily. as needed for gout 90 tablet 0   diazepam (VALIUM) 2 MG tablet Take 1 tablet (2 mg total) by mouth daily as needed for up to 10 doses for anxiety. 10 tablet 0   diltiazem (CARTIA XT) 120 MG 24 hr capsule Take 1 capsule (120 mg total) by mouth daily. 90 capsule 3   hydrOXYzine (VISTARIL) 25 MG capsule Take 1 capsule (25  mg total) by mouth every 8 (eight) hours as needed. 90 capsule 1   Insulin NPH, Human,, Isophane, (HUMULIN N KWIKPEN) 100 UNIT/ML Kiwkpen Inject 20 Units into the skin in the morning and at bedtime. Decrease your dose to 10 units every morning, and 7 units every evening still appetite back to normal     insulin regular (HUMULIN R) 100 units/mL injection Please decrease your dose to 5 units 3 times daily before meals until appetite picks up back to normal     metFORMIN (GLUCOPHAGE-XR) 500 MG 24 hr tablet Take 1 tablet by mouth 2 times daily 180 tablet 2   Multiple Vitamin (MULITIVITAMIN WITH MINERALS) TABS Take 1 tablet by mouth daily.     nitroGLYCERIN (NITROSTAT) 0.4 MG SL tablet Place 1 tablet (0.4 mg total) under the tongue every 5 (five) minutes as needed for chest pain. 25 tablet 4   omeprazole (PRILOSEC) 20 MG capsule Take 1 capsule (20 mg total) by mouth daily. 90 capsule 3   ondansetron (ZOFRAN) 4 MG tablet Take 1 tablet (4 mg total) by mouth every 8 (eight) hours as needed for nausea or vomiting. 40 tablet 1   oxyCODONE (OXY IR/ROXICODONE) 5 MG immediate release tablet Take 5 mg by mouth every 6 (six) hours as needed for severe pain (pain score 7-10).     polyethylene glycol (MIRALAX) 17 g packet Take 17 g by mouth daily as needed.     potassium chloride SA (KLOR-CON M) 20 MEQ tablet Take 0.5 tablets (10 mEq total) by mouth daily. Take in concurrence with torsemide. (Patient taking differently: Take 20 mEq by mouth daily. Take in concurrence with torsemide.)     spironolactone (ALDACTONE) 25 MG tablet Take 0.5 tablets (12.5 mg total) by mouth daily. 15 tablet 0   SYNTHROID 137  MCG tablet Take 1 tablet (137 mcg total) by mouth every morning on empty stomach. 90 tablet 1   tiZANidine (ZANAFLEX) 4 MG tablet Take 1 tablet (4 mg total) by mouth 3 (three) times daily. (Patient taking differently: Take 4 mg by mouth daily as needed for muscle spasms.) 90 tablet 1   torsemide (DEMADEX) 20 MG tablet  Take 2 tablets (40 mg total) by mouth daily. 60 tablet 0   Vibegron (GEMTESA) 75 MG TABS Take 75 mg by mouth at bedtime.     vitamin E 180 MG (400 UNITS) capsule Take 400 Units by mouth daily.     No current facility-administered medications for this visit.   Allergies  Allergen Reactions   Statins Other (See Comments)    Tried simvastatin, atorvastatin; on Repatha     Review of Systems: All systems reviewed and negative except where noted in HPI.    CT ABDOMEN PELVIS W CONTRAST Result Date: 05/18/2023 CLINICAL DATA:  Severe abdominal and flank pain. Nausea. Urinary tract infection. Constipation. EXAM: CT ABDOMEN AND PELVIS WITH CONTRAST TECHNIQUE: Multidetector CT imaging of the abdomen and pelvis was performed using the standard protocol following bolus administration of intravenous contrast. RADIATION DOSE REDUCTION: This exam was performed according to the departmental dose-optimization program which includes automated exposure control, adjustment of the mA and/or kV according to patient size and/or use of iterative reconstruction technique. CONTRAST:  75mL OMNIPAQUE IOHEXOL 350 MG/ML SOLN COMPARISON:  03/25/2023 FINDINGS: Lower Chest: No acute findings. Hepatobiliary: No suspicious hepatic masses identified. Mild capsular nodularity is suspicious for cirrhosis. Prior cholecystectomy. No evidence of biliary obstruction. Pancreas: No mass or inflammatory changes. Diffuse fatty replacement again noted. Spleen: Within normal limits in size and appearance. Adrenals/Urinary Tract: No suspicious masses identified. No evidence of ureteral calculi or hydronephrosis. Foley catheter seen in the bladder which is empty. Stomach/Bowel: No evidence of obstruction, inflammatory process or abnormal fluid collections. Normal appendix visualized. Diffuse colonic diverticulosis again seen, without signs of diverticulitis. Vascular/Lymphatic: No pathologically enlarged lymph nodes. No acute vascular findings.  Reproductive: 2 cm left posterior uterine fibroid again seen. Adnexal regions are unremarkable. Other:  None. Musculoskeletal:  No suspicious bone lesions identified. IMPRESSION: No acute findings. Colonic diverticulosis, without radiographic evidence of diverticulitis. Small uterine fibroid. Probable hepatic cirrhosis. Electronically Signed   By: Danae Orleans M.D.   On: 05/18/2023 14:28   Lab Results  Component Value Date   WBC 6.8 05/27/2023   HGB 10.1 (L) 05/27/2023   HCT 32.2 (L) 05/27/2023   MCV 57.7 (L) 05/27/2023   PLT 158 05/27/2023    Lab Results  Component Value Date   NA 137 05/27/2023   CL 103 05/27/2023   K 3.5 05/27/2023   CO2 18 (L) 05/27/2023   BUN 22 05/27/2023   CREATININE 1.11 (H) 05/27/2023   GFRNONAA 51 (L) 05/27/2023   CALCIUM 10.6 (H) 05/27/2023   PHOS 3.9 04/05/2023   ALBUMIN 3.9 05/27/2023   GLUCOSE 166 (H) 05/27/2023    Lab Results  Component Value Date   ALT 27 05/27/2023   AST 38 05/27/2023   ALKPHOS 67 05/27/2023   BILITOT 1.4 (H) 05/27/2023     Physical Exam: BP 110/64 (BP Location: Left Arm, Patient Position: Sitting)   Pulse (!) 104   SpO2 97%  Constitutional: anxious female - moaning that she cannot breath Cardiovascular: mildly tachycardic, regular rhythm.  Pulmonary/chest: Effort normal and breath sounds normal. No wheezing, rales or rhonchi. Abdominal: Soft, nondistended, nontender.  Extremities: (+) 1  LE edema Neurological: Alert and oriented to person place and time.  No obvious asterixis today although she has a hard time lifting her arms due to shoulder pain Psychiatric: behavior is altered from baseline, moaning she can't breath, in pain, states she can't go home   ASSESSMENT: 78 y.o. female here for assessment of the following  1. Shortness of breath   2. Anxiety   3. Cirrhosis of liver without ascites, unspecified hepatic cirrhosis type [facility])    Patient here for routine visit for her cirrhosis however she has had acute  issues bothering her as outlined above.  Recent hospitalization for E. coli UTI, recovering from that, she has had progressive symptoms of shortness of breath and this has made her incredibly anxious.  She has had an echo in December that looked okay, she has had 2 CT scans of her chest showed no evidence of PE and she is on anticoagulation.  She has had recent ED visits for this, was told she had a panic attack last night given some benzodiazepine and that had temporized her symptoms but recurred as soon as she got home.  She has also had some altered mental status at night (sundowning?), she has been on benzodiazepines which are confounding this, she does have cirrhosis and at risk for hepatic encephalopathy.  I do think we can treat her for this with lactulose, she has had some constipation, however I do not think that it is causing her entire constellation of symptoms.  It is rather difficult to sort out what exactly is causing her symptoms currently given the workup she has had.  She is appears anxious / restless, states she cannot go home yet at the same time refusing to go to the hospital.  Her care and current state is overwhelming her husband and son who are at the office today with her, they do not know what else to do for her.  She appears quite uncomfortable, repeatedly stating she cannot breathe, inconsolable during our visit today.  Given her current state I cannot resolve these current issues for her in our office today.  Given her respiratory complaints I do recommend she go back to the ER, I suspect she will need to be admitted.  She has not seen her cardiologist or pulmonologist in some time, while we need to sort out acute issues, I do think they need to be part of her care moving forward as well as behavioral health to help her cope with some of these issues as well.  Again would recommend lactulose to treat any component of hepatic encephalopathy that is contributing to this behavior and we  will see if that will help her in the interim.  Patient was sent to [facility] emergency room.  She is currently being evaluated there.  I messaged the care team there to let them know my thoughts.  I can follow-up with her when she is out of the hospital.  Her labs and imaging are up-to-date, otherwise her cirrhosis appears stable at this time, with possible component of hepatic encephalopathy.  Harlin Rain, MD [facility]

## 2023-05-27 NOTE — ED Notes (Signed)
Fall precautions in place. Pt has fall risk band, fall risk sign, non-slip socks, and bed alarm on.

## 2023-05-27 NOTE — Telephone Encounter (Signed)
Patients husband called in an irate mood. States that Breanna Webster has been hospitalized multiple times recently for panic attacks and that he is at his wits end. States that Dr Vickey Huger is the only doctor they have left that may be able to help her. I relayed the previous message about seeing primary care and he states that they have been to see her PCP multiple times and that they are given pills that are not working. He states that at this point the only other option is to just "let her die" because he is tired of calling 911.   I relayed that Dr Vickey Huger does not specialize in panic attacks but that I would send a message back to see if she has any recommendations. He again reiterated that he is in his 25s, running on lack of sleep and is running out of options and patience.

## 2023-05-27 NOTE — ED Notes (Addendum)
Pt is anxious and states she cannot breathe. SpO2 100%, pulse 96. Pt states she is still experiencing back and chest pain. Triage RN and Lorin PA notified. Pt provided with heat pack

## 2023-05-27 NOTE — ED Provider Notes (Signed)
[place] EMERGENCY DEPARTMENT AT [facility] Provider Note  CSN: [id number] Arrival date & time: 05/27/23 0208  Chief Complaint(s) Anxiety  HPI Breanna Webster is a 78 y.o. female with past medical history as below, significant for anxiety, chronic back pain, CAD, A-fib on Eliquis, CHF, CVA, GERD who presents to the ED with complaint of worsening anxiety.  Per spouse patient is been having nighttime panic attacks over the past 6 months.  He feels the symptoms are worsening.  She was prescribed Atarax which has not improved her symptoms.  Patient woke up suddenly this evening complaining of feeling very anxious, felt she was having a panic attack, having some chest pain difficulty breathing which is since improved.  Symptoms similar to previous presumed panic attacks in the past per the spouse.  He gave her Atarax and hydrocodone which did not improve her symptoms  On my assessment patient reports that she is exceedingly anxious, she is having some back pain sitting on the stretcher which is chronic.  This improved when she was able to transition to bedside chair.  No current chest pain or dyspnea, no fevers or chills past few days.  No nausea or vomiting.  No falls.  She is compliant with her medications  No falls, no recent med changes.   Past Medical History Past Medical History:  Diagnosis Date   Anxiety    Arthritis    "back" (04/22/2018)   Atrial fibrillation with RVR (HCC) 10/21/2018   Back pain    Bacteremia due to Streptococcus Salivarius 10/07/2018   Blood transfusion without reported diagnosis    CAD (coronary artery disease)    a. 03/2018 s/p PCI/DES to the RCA (3.0x15 Onyx DES).   Carotid artery stenosis    Mild   Cerebellar stroke, acute (HCC) 10/21/2018   Cerebrovascular accident (CVA) due to embolism of right posterior cerebral artery (HCC) 08/03/2020   Chest pain    CHF (congestive heart failure) (HCC)    Chronic lower back pain    Cirrhosis (HCC)     Colon polyps    Diverticulitis    Diverticulosis    Esophageal thickening    seen on pre TAVR CT scan, also questionable cirrhosis. MRI recommended. Will refer to GI   GERD (gastroesophageal reflux disease)    Gout    Grave's disease    H/O total shoulder replacement, left 06/01/2022   Heart murmur    History of cardioembolic stroke 03/29/2020   History of colonic polyps 05/22/2017   History of hiatal hernia    Hyperlipidemia    Hypertension    Hypothyroidism    IBS (irritable bowel syndrome)    Osteopenia    Parkinson's syndrome (HCC) 04/23/2020   Pulmonary nodules    seen on pre TAVR CT. likley benign. no follow up recommended if pt low risk.   S/P TAVR (transcatheter aortic valve replacement)    Shortness of breath on exertion    Stroke (HCC) 2021   x2   Thalassemia minor    Thyroid disease    Type II diabetes mellitus [facility])    Patient Active Problem List   Diagnosis Date Noted   Chronic combined systolic and diastolic heart failure (HCC) 05/18/2023   Pyelonephritis 05/18/2023   Lactic acidosis 05/18/2023   AKI (acute kidney injury) (HCC) 04/07/2023   Depression 04/06/2023   Acute on chronic diastolic heart failure (HCC) 04/05/2023   Low back pain 03/25/2023   Intractable pain 10/16/2022   Type 2 diabetes mellitus with  other specified complication, with long-term current use of insulin (HCC) 07/31/2022   Lymphedema of arm 07/31/2022   Preoperative cardiovascular examination 02/09/2022   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HCC) 10/25/2021   Left hemiparesis (HCC) 06/21/2021   Other specified hereditary hemolytic anemias (HCC) 06/21/2021   Parkinsonism (HCC) 06/21/2021   COVID-19 02/14/2021   Postviral fatigue syndrome 02/14/2021   Difficulty with adaptive servo-ventilation (ASV) use 02/14/2021   Sepsis secondary to UTI (HCC) 09/08/2020   Elevated ALT measurement 09/08/2020   History of stroke 09/08/2020   Hyperbilirubinemia 09/08/2020   Fatigue associated with  anemia 08/03/2020   OSA treated with BiPAP 08/03/2020   Treatment-emergent central sleep apnea 08/03/2020   Chronic intermittent hypoxia with obstructive sleep apnea 04/21/2020   Gait disturbance, post-stroke 03/29/2020   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 (HCC) 03/29/2020   Anxiety    RLQ abdominal pain 10/22/2019   Paroxysmal atrial fibrillation (HCC) 05/22/2019   IDA (iron deficiency anemia) 05/07/2019   Streptococcal endocarditis    Endocarditis of mitral valve 10/07/2018   Severe aortic stenosis 07/08/2018   S/P TAVR (transcatheter aortic valve replacement) 07/08/2018   Esophageal thickening    Atherosclerotic heart disease of native coronary artery with other forms of angina pectoris (HCC) 04/22/2018   Gastroesophageal reflux disease    Pulmonary hypertension (HCC) 02/21/2018   Essential hypertension 07/15/2017   History of colonic polyps 05/22/2017   Elevated liver function tests 12/05/2016   DOE (dyspnea on exertion) 07/19/2016   Thalassemia minor 05/29/2016   Left bundle branch block 12/06/2015   Upper airway cough syndrome 10/14/2015   (HFpEF) heart failure with preserved ejection fraction (HCC) 10/10/2015   Vitamin D deficiency 03/10/2009   Hypothyroidism 12/13/2008   Other specified disorders of bladder 12/13/2008   Home Medication(s) Prior to Admission medications   Medication Sig Start Date End Date Taking? Authorizing Provider  diazepam (VALIUM) 2 MG tablet Take 1 tablet (2 mg total) by mouth daily as needed for up to 10 doses for anxiety. 05/27/23  Yes Sloan Leiter, DO  acetaminophen (TYLENOL) 500 MG tablet Take 1 tablet (500 mg total) by mouth 4 (four) times daily. 04/09/23   Arrien, York Ram, MD  allopurinol (ZYLOPRIM) 100 MG tablet Take 1 tablet (100 mg total) by mouth daily. 02/28/23     amitriptyline (ELAVIL) 25 MG tablet Take 1 tablet (25 mg total) by mouth at bedtime. Patient taking differently: Take 25 mg by mouth at bedtime as  needed for sleep (Headaches). 01/17/23   Nafziger, Kandee Keen, NP  apixaban (ELIQUIS) 5 MG TABS tablet Take 1 tablet (5 mg total) by mouth 2 (two) times daily. 02/21/23   Shirline Frees, NP  b complex vitamins capsule Take 1 capsule by mouth daily.    [provider]  bisacodyl (DULCOLAX) 5 MG EC tablet Take 1 Tablet with each dose pf pain medicine. 05/15/23   Shirline Frees, NP  cephALEXin (KEFLEX) 500 MG capsule Take 1 capsule (500 mg total) by mouth 2 (two) times daily for 7 days. 05/21/23 05/28/23  Elgergawy, Leana Roe, MD  Cholecalciferol (D3 PO) Take 1 tablet by mouth daily.    [provider]  colchicine 0.6 MG tablet Take 1 tablet (0.6 mg total) by mouth daily. as needed for gout 02/28/23     diltiazem (CARTIA XT) 120 MG 24 hr capsule Take 1 capsule (120 mg total) by mouth daily. 02/03/23   Ronney Asters, NP  hydrOXYzine (VISTARIL) 25 MG capsule Take 1 capsule (25 mg  total) by mouth every 8 (eight) hours as needed. 05/15/23   Nafziger, Kandee Keen, NP  Insulin NPH, Human,, Isophane, (HUMULIN N KWIKPEN) 100 UNIT/ML Kiwkpen Inject 20 Units into the skin in the morning and at bedtime. Decrease your dose to 10 units every morning, and 7 units every evening still appetite back to normal 05/21/23   Elgergawy, Leana Roe, MD  insulin regular (HUMULIN R) 100 units/mL injection Please decrease your dose to 5 units 3 times daily before meals until appetite picks up back to normal 05/21/23   Elgergawy, Leana Roe, MD  metFORMIN (GLUCOPHAGE-XR) 500 MG 24 hr tablet Take 1 tablet by mouth 2 times daily 08/29/22     Multiple Vitamin (MULITIVITAMIN WITH MINERALS) TABS Take 1 tablet by mouth daily.    [provider]  nitroGLYCERIN (NITROSTAT) 0.4 MG SL tablet Place 1 tablet (0.4 mg total) under the tongue every 5 (five) minutes as needed for chest pain. Patient not taking: Reported on 05/24/2023 05/15/23   Ronney Asters, NP  omeprazole (PRILOSEC) 20 MG capsule Take 1 capsule (20 mg total) by mouth daily.  04/04/23   [name], [name], PA-C  ondansetron (ZOFRAN) 4 MG tablet Take 1 tablet (4 mg total) by mouth every 8 (eight) hours as needed for nausea or vomiting. 04/04/23   [name], [name], PA-C  oxyCODONE (OXY IR/ROXICODONE) 5 MG immediate release tablet Take 5 mg by mouth every 6 (six) hours as needed for severe pain (pain score 7-10).    [provider]  polyethylene glycol (MIRALAX) 17 g packet Take 17 g by mouth daily as needed. 12/19/22   [name], Willaim Rayas, MD  potassium chloride SA (KLOR-CON M) 20 MEQ tablet Take 0.5 tablets (10 mEq total) by mouth daily. Take in concurrence with torsemide. Patient taking differently: Take 20 mEq by mouth daily. Take in concurrence with torsemide. 04/09/23   Arrien, York Ram, MD  spironolactone (ALDACTONE) 25 MG tablet Take 0.5 tablets (12.5 mg total) by mouth daily. 05/24/23 06/23/23  Elgergawy, Leana Roe, MD  SYNTHROID 137 MCG tablet Take 1 tablet (137 mcg total) by mouth every morning on empty stomach. 01/24/23     tiZANidine (ZANAFLEX) 4 MG tablet Take 1 tablet (4 mg total) by mouth 3 (three) times daily. Patient taking differently: Take 4 mg by mouth daily as needed for muscle spasms. 01/09/23     torsemide (DEMADEX) 20 MG tablet Take 2 tablets (40 mg total) by mouth daily. 05/24/23   Elgergawy, Leana Roe, MD  Vibegron (GEMTESA) 75 MG TABS Take 75 mg by mouth at bedtime.    [provider]  vitamin E 180 MG (400 UNITS) capsule Take 400 Units by mouth daily.    [provider]                                                                                                                                    Past Surgical  History Past Surgical History:  Procedure Laterality Date   63 HOUR PH STUDY N/A 03/03/2018   Procedure: 24 HOUR PH STUDY;  Surgeon: Napoleon Form, MD;  Location: [facility] ENDOSCOPY;  Service: Endoscopy;  Laterality: N/A;   COLONOSCOPY  2023   COLONOSCOPY W/ BIOPSIES AND POLYPECTOMY     CORONARY ANGIOGRAPHY  Right 04/21/2018   Procedure: CORONARY ANGIOGRAPHY (CATH LAB);  Surgeon: Lyn Records, MD;  Location: [facility] INVASIVE CV LAB;  Service: Cardiovascular;  Laterality: Right;   CORONARY STENT INTERVENTION N/A 04/22/2018   Procedure: CORONARY STENT INTERVENTION;  Surgeon: Lyn Records, MD;  Location: [facility] INVASIVE CV LAB;  Service: Cardiovascular;  Laterality: N/A;   DILATION AND CURETTAGE OF UTERUS     ESOPHAGEAL MANOMETRY N/A 03/03/2018   Procedure: ESOPHAGEAL MANOMETRY (EM);  Surgeon: Napoleon Form, MD;  Location: [facility] ENDOSCOPY;  Service: Endoscopy;  Laterality: N/A;   HYSTEROSCOPY     fibroids   LAPAROSCOPIC CHOLECYSTECTOMY  1985   LAPAROSCOPY     fibroids   NISSEN FUNDOPLICATION  1990s   POLYPECTOMY     REVERSE SHOULDER ARTHROPLASTY Left 06/01/2022   Procedure: REVERSE SHOULDER ARTHROPLASTY;  Surgeon: Beverely Low, MD;  Location: [facility] ORS;  Service: Orthopedics;  Laterality: Left;  choice with interscalene block   RIGHT/LEFT HEART CATH AND CORONARY ANGIOGRAPHY N/A 02/20/2018   Procedure: RIGHT/LEFT HEART CATH AND CORONARY ANGIOGRAPHY;  Surgeon: Lyn Records, MD;  Location: [facility] INVASIVE CV LAB;  Service: Cardiovascular;  Laterality: N/A;   TEE WITHOUT CARDIOVERSION N/A 07/08/2018   Procedure: TRANSESOPHAGEAL ECHOCARDIOGRAM (TEE);  Surgeon: Kathleene Hazel, MD;  Location: [facility] OR;  Service: Open Heart Surgery;  Laterality: N/A;   TEE WITHOUT CARDIOVERSION  10/07/2018   TEE WITHOUT CARDIOVERSION N/A 10/07/2018   Procedure: TRANSESOPHAGEAL ECHOCARDIOGRAM (TEE);  Surgeon: Jake Bathe, MD;  Location: [facility] ENDOSCOPY;  Service: Cardiovascular;  Laterality: N/A;   TONSILLECTOMY     age 16   TRANSCATHETER AORTIC VALVE REPLACEMENT, TRANSFEMORAL N/A 07/08/2018   Procedure: TRANSCATHETER AORTIC VALVE REPLACEMENT, TRANSFEMORAL;  Surgeon: Kathleene Hazel, MD;  Location: [facility] OR;  Service: Open Heart Surgery;  Laterality: N/A;   Family History Family History  Adopted: Yes  Problem  Relation Age of Onset   Healthy Son        x 2   Headache Other        Cluster headaches   Heart failure Mother    Colon cancer Neg Hx    Pancreatic cancer Neg Hx    Rectal cancer Neg Hx    Stomach cancer Neg Hx     Social History Social History   Tobacco Use   Smoking status: Never    Passive exposure: Never   Smokeless tobacco: Never  Vaping Use   Vaping status: Never Used  Substance Use Topics   Alcohol use: No    Alcohol/week: 0.0 standard drinks of alcohol   Drug use: Never   Allergies Statins  Review of Systems Review of Systems  Constitutional:  Negative for chills and fever.  Respiratory:  Positive for chest tightness and shortness of breath.   Cardiovascular:  Positive for chest pain. Negative for palpitations.  Gastrointestinal:  Negative for abdominal pain and nausea.  Musculoskeletal:  Positive for arthralgias.  Psychiatric/Behavioral:  Positive for agitation, behavioral problems and sleep disturbance. The patient is nervous/anxious.   All other systems reviewed and are negative.   Physical Exam Vital Signs  I have reviewed the triage vital signs BP 131/71 (BP Location:  Right Arm)   Pulse 98   Temp 97.6 F (36.4 C) (Oral)   Resp (!) 22   SpO2 100%  Physical Exam Vitals and nursing note reviewed.  Constitutional:      General: She is not in acute distress.    Appearance: Normal appearance. She is well-developed. She is not ill-appearing.  HENT:     Head: Normocephalic and atraumatic. No right periorbital erythema or left periorbital erythema.     Comments: No external evidence of head trauma    Right Ear: External ear normal.     Left Ear: External ear normal.     Nose: Nose normal.     Mouth/Throat:     Mouth: Mucous membranes are moist.  Eyes:     General: No scleral icterus.       Right eye: No discharge.        Left eye: No discharge.  Cardiovascular:     Rate and Rhythm: Normal rate.  Pulmonary:     Effort: Pulmonary effort is  normal. No respiratory distress.     Breath sounds: No stridor.  Abdominal:     General: Abdomen is flat. There is no distension.     Palpations: Abdomen is soft.     Tenderness: There is no guarding.  Musculoskeletal:        General: No deformity.       Arms:     Cervical back: No rigidity.     Comments: No midline spinous process tenderness to palpation or percussion, no crepitus or step-off.    She has mild paraspinal TTP left lumbar, unchanged from chronic pain   Skin:    General: Skin is warm and dry.     Coloration: Skin is not cyanotic, jaundiced or pale.  Neurological:     Mental Status: She is alert and oriented to person, place, and time.     GCS: GCS eye subscore is 4. GCS verbal subscore is 5. GCS motor subscore is 6.     Cranial Nerves: No dysarthria or facial asymmetry.     Motor: No tremor.     Coordination: Coordination normal.     Gait: Gait is intact.  Psychiatric:        Mood and Affect: Mood is anxious. Affect is labile and tearful.        Speech: Speech is rapid and pressured.        Behavior: Behavior normal. Behavior is not agitated. Behavior is cooperative.     ED Results and Treatments Labs (all labs ordered are listed, but only abnormal results are displayed) Labs Reviewed - No data to display                                                                                                                         Radiology No results found.  Pertinent labs & imaging results that were available during my care of the patient were reviewed by me and considered in my medical  decision making (see MDM for details).  Medications Ordered in ED Medications  lidocaine (LIDODERM) 5 % 1 patch (1 patch Transdermal Patch Applied 05/27/23 0448)  diazepam (VALIUM) tablet 5 mg (5 mg Oral Given 05/27/23 0446)                                                                                                                                      Procedures Procedures  (including critical care time)  Medical Decision Making / ED Course    Medical Decision Making:    LARETA BRUNEAU is a 78 y.o. female with past medical history as below, significant for anxiety, chronic back pain, CAD, A-fib on Eliquis, CHF, CVA, GERD who presents to the ED with complaint of worsening anxiety.. The complaint involves an extensive differential diagnosis and also carries with it a high risk of complications and morbidity.  Serious etiology was considered. Ddx includes but is not limited to: Differential includes all life-threatening causes for chest pain. This includes but is not exclusive to acute coronary syndrome, aortic dissection, pulmonary embolism, cardiac tamponade, community-acquired pneumonia, pericarditis, musculoskeletal chest wall pain, etc. In my evaluation of this patient's dyspnea my DDx includes, but is not limited to, pneumonia, pulmonary embolism, pneumothorax, pulmonary edema, metabolic acidosis, asthma, COPD, cardiac cause, anemia, anxiety, etc.    Complete initial physical exam performed, notably the patient was in no acute distress but does appear very anxious.  No chest pain or dyspnea, she is requesting.    Reviewed and confirmed nursing documentation for past medical history, family history, social history.  Vital signs reviewed.         Brief summary: 78 year old female history as above here with worsening anxiety, possible panic attack.  Symptoms unrelieved with hydroxyzine and oxycodone.  She appears very anxious on initial assessment.  Give Valium.  Will attempt obtain screening labs and chest x-ray once more calm.  Patient is feeling significantly better after Valium.  Discussed at length with patient her husband and son at bedside.  They do not want any labs obtained this evening or any imaging.  They report they have gotten "all the labs" and do not need any further lab work done.  I advised the patient and family  that I cannot rule out many acute pathologies without labs and imaging, further workup etc  They are understanding of this and again affirmed they do not want a further workup completed.  They will call PCP later on today for recheck.  I will send a prescription for Valium to her pharmacy as this seemed to work very well for her.  She has documentary of Parkinson's disease, concern for possible progression of her disease, possible dementia.  Will defer to PCP for further testing.  She follows with neurology as well but has not seen them in multiple months.  Encouraged outpatient follow-up with neurology as well.  The patient improved significantly and  was discharged in stable condition. Detailed discussions were had with the patient/guardian regarding current findings, and need for close f/u with PCP or on call doctor. The patient/guardian has been instructed to return immediately if the symptoms worsen in any way for re-evaluation. Patient/guardian verbalized understanding and is in agreement with current care plan. All questions answered prior to discharge.                Additional history obtained: -Additional history obtained from family -External records from outside source obtained and reviewed including: Chart review including previous notes, labs, imaging, consultation notes including  Home medications, prior labs and imaging, prior admission and ER visits   Lab Tests: -I ordered, reviewed, and interpreted labs.   The pertinent results include:   Labs Reviewed - No data to display   Notable for na  EKG   EKG Interpretation Date/Time:    Ventricular Rate:    PR Interval:    QRS Duration:    QT Interval:    QTC Calculation:   R Axis:      Text Interpretation:           Imaging Studies ordered: I ordered imaging studies including CXR > pt/family refused imaging    Medicines ordered and prescription drug management: Meds ordered this encounter  Medications    diazepam (VALIUM) tablet 5 mg   lidocaine (LIDODERM) 5 % 1 patch   diazepam (VALIUM) 2 MG tablet    Sig: Take 1 tablet (2 mg total) by mouth daily as needed for up to 10 doses for anxiety.    Dispense:  10 tablet    Refill:  0    -I have reviewed the patients home medicines and have made adjustments as needed   Consultations Obtained: na   Cardiac Monitoring: Continuous pulse oximetry interpreted by myself, 100% on RA.    Social Determinants of Health:  Diagnosis or treatment significantly limited by social determinants of health: na   Reevaluation: After the interventions noted above, I reevaluated the patient and found that they have improved  Co morbidities that complicate the patient evaluation  Past Medical History:  Diagnosis Date   Anxiety    Arthritis    "back" (04/22/2018)   Atrial fibrillation with RVR (HCC) 10/21/2018   Back pain    Bacteremia due to Streptococcus Salivarius 10/07/2018   Blood transfusion without reported diagnosis    CAD (coronary artery disease)    a. 03/2018 s/p PCI/DES to the RCA (3.0x15 Onyx DES).   Carotid artery stenosis    Mild   Cerebellar stroke, acute (HCC) 10/21/2018   Cerebrovascular accident (CVA) due to embolism of right posterior cerebral artery (HCC) 08/03/2020   Chest pain    CHF (congestive heart failure) (HCC)    Chronic lower back pain    Cirrhosis (HCC)    Colon polyps    Diverticulitis    Diverticulosis    Esophageal thickening    seen on pre TAVR CT scan, also questionable cirrhosis. MRI recommended. Will refer to GI   GERD (gastroesophageal reflux disease)    Gout    Grave's disease    H/O total shoulder replacement, left 06/01/2022   Heart murmur    History of cardioembolic stroke 03/29/2020   History of colonic polyps 05/22/2017   History of hiatal hernia    Hyperlipidemia    Hypertension    Hypothyroidism    IBS (irritable bowel syndrome)    Osteopenia    Parkinson's syndrome (HCC) 04/23/2020  Pulmonary nodules    seen on pre TAVR CT. likley benign. no follow up recommended if pt low risk.   S/P TAVR (transcatheter aortic valve replacement)    Shortness of breath on exertion    Stroke [facility]) 2021   x2   Thalassemia minor    Thyroid disease    Type II diabetes mellitus (HCC)       Dispostion: Disposition decision including need for hospitalization was considered, and patient discharged from emergency department.    Final Clinical Impression(s) / ED Diagnoses Final diagnoses:  Anxiousness  History of Parkinson's disease        Sloan Leiter, DO 05/27/23 5784

## 2023-05-27 NOTE — Telephone Encounter (Signed)
Spoke to husband accepted appointment to see Dr Dohmeier tomorrow 05/28/2023 at 1030am Husband was appreciative for sooner appointment

## 2023-05-27 NOTE — ED Triage Notes (Signed)
Pt c/o chills and SOB started yesterday. Pt is tachypneic.

## 2023-05-27 NOTE — ED Notes (Signed)
Pt.'s husband stated he doesn't want her to have any more tests bc she's had them all before.

## 2023-05-27 NOTE — ED Provider Triage Note (Signed)
Emergency Medicine Provider Triage Evaluation Note  Breanna Webster , a 78 y.o. female  was evaluated in triage.  Pt complains of chills, shortness of breath yesterday. Went to [facility] ED early this AM for panic attacks/anxiety. Family had refused blood work or imaging. Patient continues to have symptoms, reporting over and over "it's so cold, I can't breathe". She had mentioned some CP to family yesterday but no so much today. Family worried about UTI  Review of Systems  Positive: SOB, chills, anxiety Negative:   Physical Exam  BP 124/80   Pulse 93   Temp 98.4 F (36.9 C) (Oral)   Resp (!) 22   Ht 5\' 5"  (1.651 m)   Wt 77.2 kg   BMI 28.32 kg/m  Gen:   Awake, no distress   Resp:  Normal effort  MSK:   Moves extremities without difficulty  Other:  Appears very anxious  Medical Decision Making  Medically screening exam initiated at 3:18 PM.  Appropriate orders placed.  Brianna Bennett Rotter was informed that the remainder of the evaluation will be completed by another provider, this initial triage assessment does not replace that evaluation, and the importance of remaining in the ED until their evaluation is complete.  Workup initiated   Trilby Way T, PA-C 05/27/23 1519

## 2023-05-27 NOTE — ED Triage Notes (Signed)
Pt arrived with husband and son; Per husband, pt woke him from sleep stating she couldn't breath although breathing was fine at that time. Husband gave her Oxycodone, zofran, and hydroxyzine without improvement. Pt appears anxious. Water given per request on arrival

## 2023-05-28 ENCOUNTER — Telehealth: Payer: Self-pay

## 2023-05-28 ENCOUNTER — Emergency Department (HOSPITAL_COMMUNITY): Payer: Medicare Other

## 2023-05-28 ENCOUNTER — Telehealth: Payer: Self-pay | Admitting: Neurology

## 2023-05-28 ENCOUNTER — Ambulatory Visit: Payer: Medicare Other | Admitting: Neurology

## 2023-05-28 ENCOUNTER — Other Ambulatory Visit (HOSPITAL_COMMUNITY): Payer: Self-pay

## 2023-05-28 DIAGNOSIS — R4182 Altered mental status, unspecified: Secondary | ICD-10-CM | POA: Diagnosis not present

## 2023-05-28 DIAGNOSIS — R079 Chest pain, unspecified: Secondary | ICD-10-CM | POA: Diagnosis not present

## 2023-05-28 DIAGNOSIS — R4189 Other symptoms and signs involving cognitive functions and awareness: Secondary | ICD-10-CM | POA: Diagnosis not present

## 2023-05-28 DIAGNOSIS — R519 Headache, unspecified: Secondary | ICD-10-CM | POA: Diagnosis not present

## 2023-05-28 DIAGNOSIS — R29818 Other symptoms and signs involving the nervous system: Secondary | ICD-10-CM

## 2023-05-28 DIAGNOSIS — I2510 Atherosclerotic heart disease of native coronary artery without angina pectoris: Secondary | ICD-10-CM | POA: Diagnosis not present

## 2023-05-28 DIAGNOSIS — F419 Anxiety disorder, unspecified: Secondary | ICD-10-CM

## 2023-05-28 DIAGNOSIS — R0602 Shortness of breath: Secondary | ICD-10-CM | POA: Diagnosis not present

## 2023-05-28 LAB — CBG MONITORING, ED
Glucose-Capillary: 182 mg/dL — ABNORMAL HIGH (ref 70–99)
Glucose-Capillary: 209 mg/dL — ABNORMAL HIGH (ref 70–99)
Glucose-Capillary: 149 mg/dL — ABNORMAL HIGH (ref 70–99)
Glucose-Capillary: 266 mg/dL — ABNORMAL HIGH (ref 70–99)
Glucose-Capillary: 177 mg/dL — ABNORMAL HIGH (ref 70–99)

## 2023-05-28 MED ORDER — ACETAMINOPHEN 500 MG PO TABS
1000.0000 mg | ORAL_TABLET | ORAL | Status: AC
  Administered 2023-05-28: 1000 mg via ORAL
  Filled 2023-05-28: qty 2

## 2023-05-28 MED ORDER — LORAZEPAM 2 MG/ML IJ SOLN
1.0000 mg | Freq: Once | INTRAMUSCULAR | Status: AC
  Administered 2023-05-28: 1 mg via INTRAVENOUS
  Filled 2023-05-28: qty 1

## 2023-05-28 MED ORDER — INSULIN NPH (HUMAN) (ISOPHANE) 100 UNIT/ML ~~LOC~~ SUSP
10.0000 [IU] | Freq: Every day | SUBCUTANEOUS | Status: DC
Start: 2023-05-28 — End: 2023-05-29
  Filled 2023-05-28: qty 10

## 2023-05-28 MED ORDER — IOHEXOL 350 MG/ML SOLN
75.0000 mL | Freq: Once | INTRAVENOUS | Status: AC | PRN
  Administered 2023-05-28: 75 mL via INTRAVENOUS

## 2023-05-28 MED ORDER — HALOPERIDOL LACTATE 5 MG/ML IJ SOLN: 2.5000 mg | Freq: Once | INTRAMUSCULAR | Status: DC

## 2023-05-28 MED ORDER — INSULIN NPH (HUMAN) (ISOPHANE) 100 UNIT/ML ~~LOC~~ SUSP
7.0000 [IU] | Freq: Every day | SUBCUTANEOUS | Status: DC
Start: 2023-05-28 — End: 2023-05-29
  Administered 2023-05-28: 7 [IU] via SUBCUTANEOUS

## 2023-05-28 MED ORDER — HALOPERIDOL LACTATE 5 MG/ML IJ SOLN
2.5000 mg | Freq: Once | INTRAMUSCULAR | Status: AC
  Administered 2023-05-28: 2.5 mg via INTRAVENOUS
  Filled 2023-05-28: qty 1

## 2023-05-28 MED ORDER — QUETIAPINE FUMARATE 25 MG PO TABS
25.0000 mg | ORAL_TABLET | Freq: Every day | ORAL | Status: DC
  Administered 2023-05-28: 25 mg via ORAL
  Filled 2023-05-28: qty 1

## 2023-05-28 MED ORDER — DIAZEPAM 2 MG PO TABS
2.0000 mg | ORAL_TABLET | Freq: Once | ORAL | Status: AC
  Administered 2023-05-28: 2 mg via ORAL
  Filled 2023-05-28: qty 1

## 2023-05-28 NOTE — ED Notes (Signed)
 Patient transported to CT

## 2023-05-28 NOTE — [place] FL2 (Signed)
 [place]  MEDICAID FL2 LEVEL OF CARE FORM     IDENTIFICATION  Patient Name: Breanna Webster Birthdate: 02/03/1946 Sex: female Admission Date (Current Location): 05/27/2023  [facility] [place] Number:  Producer, Television/film/video and Address:  [facility], [address] [place], [place] [address]      Provider Number: [id number]  Attending Physician Name and Address:  System, Provider Not In  Relative Name and Phone Number:       Current Level of Care: Hospital Recommended Level of Care: Skilled Nursing Facility Prior Approval Number:    Date Approved/Denied:   PASRR Number: [id number] (Expires 06/05/23)  Discharge Plan: SNF    Current Diagnoses: Patient Active Problem List   Diagnosis Date Noted   Chronic combined systolic and diastolic heart failure (HCC) 05/18/2023   Pyelonephritis 05/18/2023   Lactic acidosis 05/18/2023   AKI (acute kidney injury) (HCC) 04/07/2023   Depression 04/06/2023   Acute on chronic diastolic heart failure (HCC) 04/05/2023   Low back pain 03/25/2023   Intractable pain 10/16/2022   Type 2 diabetes mellitus with other specified complication, with long-term current use of insulin  (HCC) 07/31/2022   Lymphedema of arm 07/31/2022   Preoperative cardiovascular examination 02/09/2022   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HCC) 10/25/2021   Left hemiparesis (HCC) 06/21/2021   Other specified hereditary hemolytic anemias (HCC) 06/21/2021   Parkinsonism (HCC) 06/21/2021   COVID-19 02/14/2021   Postviral fatigue syndrome 02/14/2021   Difficulty with adaptive servo-ventilation (ASV) use 02/14/2021   Sepsis secondary to UTI (HCC) 09/08/2020   Elevated ALT measurement 09/08/2020   History of stroke 09/08/2020   Hyperbilirubinemia 09/08/2020   Fatigue associated with anemia 08/03/2020   OSA treated with BiPAP 08/03/2020   Treatment-emergent central sleep apnea 08/03/2020   Chronic intermittent hypoxia with obstructive sleep  apnea 04/21/2020   Gait disturbance, post-stroke 03/29/2020   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 (HCC) 03/29/2020   Anxiety    RLQ abdominal pain 10/22/2019   Paroxysmal atrial fibrillation (HCC) 05/22/2019   IDA (iron deficiency anemia) 05/07/2019   Streptococcal endocarditis    Endocarditis of mitral valve 10/07/2018   Severe aortic stenosis 07/08/2018   S/P TAVR (transcatheter aortic valve replacement) 07/08/2018   Esophageal thickening    Atherosclerotic heart disease of native coronary artery with other forms of angina pectoris (HCC) 04/22/2018   Gastroesophageal reflux disease    Pulmonary hypertension (HCC) 02/21/2018   Essential hypertension 07/15/2017   History of colonic polyps 05/22/2017   Elevated liver function tests 12/05/2016   DOE (dyspnea on exertion) 07/19/2016   Thalassemia minor 05/29/2016   Left bundle branch block 12/06/2015   Upper airway cough syndrome 10/14/2015   (HFpEF) heart failure with preserved ejection fraction (HCC) 10/10/2015   Vitamin D  deficiency 03/10/2009   Hypothyroidism 12/13/2008   Other specified disorders of bladder 12/13/2008    Orientation RESPIRATION BLADDER Height & Weight     Self, Time, Situation, Place  Normal Continent Weight: 170 lb 3.1 oz (77.2 kg) Height:  5' 5 (165.1 cm)  BEHAVIORAL SYMPTOMS/MOOD NEUROLOGICAL BOWEL NUTRITION STATUS      Continent Diet (See discharge summary)  AMBULATORY STATUS COMMUNICATION OF NEEDS Skin   Extensive Assist   Normal                       Personal Care Assistance Level of Assistance  Feeding, Dressing, Bathing Bathing Assistance: Limited assistance Feeding assistance: Limited assistance Dressing Assistance: Limited assistance  Functional Limitations Info  Sight, Hearing, Speech Sight Info: Adequate Hearing Info: Adequate Speech Info: Adequate    SPECIAL CARE FACTORS FREQUENCY  OT (By licensed OT), PT (By licensed PT)     PT Frequency: 5x weekly OT  Frequency: 5x weekly            Contractures Contractures Info: Not present    Additional Factors Info  Code Status, Allergies Code Status Info: Full Code Allergies Info: Statins           Current Medications (05/28/2023):  This is the current hospital active medication list Current Facility-Administered Medications  Medication Dose Route Frequency Provider Last Rate Last Admin   allopurinol  (ZYLOPRIM ) tablet 100 mg  100 mg Oral Daily Laurice Maude BROCKS, MD       amitriptyline  (ELAVIL ) tablet 25 mg  25 mg Oral QHS PRN Messick, Peter C, MD       apixaban  (ELIQUIS ) tablet 5 mg  5 mg Oral BID Messick, Peter C, MD       bisacodyl  (DULCOLAX) EC tablet 5 mg  5 mg Oral Daily PRN Messick, Peter C, MD       diazepam  (VALIUM ) tablet 2 mg  2 mg Oral Daily PRN Laurice Maude BROCKS, MD       diltiazem  (CARDIZEM  CD) 24 hr capsule 120 mg  120 mg Oral Daily Messick, Peter C, MD       haloperidol  lactate (HALDOL ) injection 2.5 mg  2.5 mg Intravenous Once Rancour, Stephen, MD       insulin  aspart (novoLOG ) injection 5 Units  5 Units Subcutaneous TID WC Laurice Maude BROCKS, MD       insulin  NPH Human (NOVOLIN  N) injection 10 Units  10 Units Subcutaneous QAC breakfast Laurice Maude BROCKS, MD       insulin  NPH Human (NOVOLIN  N) injection 7 Units  7 Units Subcutaneous QHS Messick, Peter C, MD       levothyroxine  (SYNTHROID ) tablet 137 mcg  137 mcg Oral q morning Laurice Maude BROCKS, MD   137 mcg at 05/28/23 0631   metFORMIN  (GLUCOPHAGE -XR) 24 hr tablet 500 mg  500 mg Oral BID Messick, Peter C, MD       multivitamin with minerals tablet 1 tablet  1 tablet Oral Daily Laurice Maude BROCKS, MD       ondansetron  (ZOFRAN ) tablet 4 mg  4 mg Oral Q8H PRN Messick, Peter C, MD       oxyCODONE  (Oxy IR/ROXICODONE ) immediate release tablet 5 mg  5 mg Oral Q6H PRN Messick, Peter C, MD   5 mg at 05/28/23 1202   pantoprazole  (PROTONIX ) EC tablet 40 mg  40 mg Oral Daily Messick, Peter C, MD       polyethylene glycol (MIRALAX  / GLYCOLAX )  packet 17 g  17 g Oral Daily PRN Messick, Peter C, MD       potassium chloride  SA (KLOR-CON  M) CR tablet 20 mEq  20 mEq Oral Daily Laurice Maude BROCKS, MD       QUEtiapine  (SEROQUEL ) tablet 25 mg  25 mg Oral QHS Mardy Legacy, NP       spironolactone  (ALDACTONE ) tablet 12.5 mg  12.5 mg Oral Daily Laurice Maude BROCKS, MD       torsemide  (DEMADEX ) tablet 40 mg  40 mg Oral Daily Laurice Maude BROCKS, MD       Current Outpatient Medications  Medication Sig Dispense Refill   allopurinol  (ZYLOPRIM ) 100 MG tablet Take 1 tablet (100 mg total) by mouth daily.  90 tablet 1   amitriptyline  (ELAVIL ) 25 MG tablet Take 1 tablet (25 mg total) by mouth at bedtime. (Patient taking differently: Take 25 mg by mouth at bedtime as needed for sleep (Headaches).) 90 tablet 1   apixaban  (ELIQUIS ) 5 MG TABS tablet Take 1 tablet (5 mg total) by mouth 2 (two) times daily. 60 tablet 3   diltiazem  (CARTIA  XT) 120 MG 24 hr capsule Take 1 capsule (120 mg total) by mouth daily. 90 capsule 3   hydrOXYzine  (VISTARIL ) 25 MG capsule Take 1 capsule (25 mg total) by mouth every 8 (eight) hours as needed. 90 capsule 1   Insulin  NPH, Human,, Isophane, (HUMULIN  N KWIKPEN) 100 UNIT/ML Kiwkpen Inject 20 Units into the skin in the morning and at bedtime. Decrease your dose to 10 units every morning, and 7 units every evening still appetite back to normal     metFORMIN  (GLUCOPHAGE -XR) 500 MG 24 hr tablet Take 1 tablet by mouth 2 times daily 180 tablet 2   omeprazole  (PRILOSEC) 20 MG capsule Take 1 capsule (20 mg total) by mouth daily. 90 capsule 3   ondansetron  (ZOFRAN ) 4 MG tablet Take 1 tablet (4 mg total) by mouth every 8 (eight) hours as needed for nausea or vomiting. 40 tablet 1   spironolactone  (ALDACTONE ) 25 MG tablet Take 0.5 tablets (12.5 mg total) by mouth daily. 15 tablet 0   SYNTHROID  137 MCG tablet Take 1 tablet (137 mcg total) by mouth every morning on empty stomach. 90 tablet 1   tiZANidine  (ZANAFLEX ) 4 MG tablet Take 1 tablet (4 mg  total) by mouth 3 (three) times daily. (Patient taking differently: Take 4 mg by mouth daily as needed for muscle spasms.) 90 tablet 1   torsemide  (DEMADEX ) 20 MG tablet Take 2 tablets (40 mg total) by mouth daily. 60 tablet 0   acetaminophen  (TYLENOL ) 500 MG tablet Take 1 tablet (500 mg total) by mouth 4 (four) times daily. 30 tablet 0   b complex vitamins capsule Take 1 capsule by mouth daily.     bisacodyl  (DULCOLAX) 5 MG EC tablet Take 1 Tablet with each dose pf pain medicine. 100 tablet 1   cephALEXin  (KEFLEX ) 500 MG capsule Take 1 capsule (500 mg total) by mouth 2 (two) times daily for 7 days. 14 capsule 0   Cholecalciferol  (D3 PO) Take 1 tablet by mouth daily.     colchicine  0.6 MG tablet Take 1 tablet (0.6 mg total) by mouth daily. as needed for gout 90 tablet 0   diazepam  (VALIUM ) 2 MG tablet Take 1 tablet (2 mg total) by mouth daily as needed for up to 10 doses for anxiety. 10 tablet 0   insulin  regular (HUMULIN  R) 100 units/mL injection Please decrease your dose to 5 units 3 times daily before meals until appetite picks up back to normal     Multiple Vitamin (MULITIVITAMIN WITH MINERALS) TABS Take 1 tablet by mouth daily.     nitroGLYCERIN  (NITROSTAT ) 0.4 MG SL tablet Place 1 tablet (0.4 mg total) under the tongue every 5 (five) minutes as needed for chest pain. 25 tablet 4   oxyCODONE  (OXY IR/ROXICODONE ) 5 MG immediate release tablet Take 5 mg by mouth every 6 (six) hours as needed for severe pain (pain score 7-10).     polyethylene glycol (MIRALAX ) 17 g packet Take 17 g by mouth daily as needed.     potassium chloride  SA (KLOR-CON  M) 20 MEQ tablet Take 0.5 tablets (10 mEq total) by mouth daily.  Take in concurrence with torsemide . (Patient taking differently: Take 20 mEq by mouth daily. Take in concurrence with torsemide .)     Vibegron  (GEMTESA ) 75 MG TABS Take 75 mg by mouth at bedtime.     vitamin E  180 MG (400 UNITS) capsule Take 400 Units by mouth daily.       Discharge  Medications: Please see discharge summary for a list of discharge medications.  Relevant Imaging Results:  Relevant Lab Results:   Additional Information SSN: [id number]  Niels LITTIE Portugal, LCSW

## 2023-05-28 NOTE — ED Notes (Signed)
Call received from provider from [facility] telehealth for TTS assessment. Provider notified patient was medicated by previous RN and that patient is asleep. This RN will contact provider once patient is awake and able to participate in assessment.

## 2023-05-28 NOTE — Progress Notes (Deleted)
 [facility] Consult [facility]  Patient Name: Breanna Webster MRN: [id number] DOB: 04/29/1945 DATE OF Consult: 05/28/2023  PRIMARY PSYCHIATRIC DIAGNOSES  1.  *** 2.  *** 3.  ***  RECOMMENDATIONS  {Recommendations:304550007::Medication recommendations: ***,Non-Medication/therapeutic recommendations: ***,Communication: Treatment team members (and family members if applicable) who were involved in treatment/care discussions and planning, and with whom we spoke or engaged with via secure text/chat, include the following: ***}  Thank you for involving us  in the care of this patient. If you have any additional questions or concerns, please call [phone] and ask for me or the provider on-call.  TELEPSYCHIATRY ATTESTATION & CONSENT  As the provider for this telehealth consult, I attest that I verified the patient's identity using two separate identifiers, introduced myself to the patient, provided my credentials, disclosed my location, and performed this encounter via a HIPAA-compliant, real-time, face-to-face, two-way, interactive audio and video platform and with the full consent and agreement of the patient (or guardian as applicable.)  Patient physical location: [facility]. Telehealth provider physical location: home office in state of [place].  Video start time: *** (Central Time) Video end time: *** (Central Time)  IDENTIFYING DATA  Breanna Webster is a 78 y.o. year-old female for whom a psychiatric consultation has been ordered by the primary provider. The patient was identified using two separate identifiers.  CHIEF COMPLAINT/REASON FOR CONSULT  ***  HISTORY OF PRESENT ILLNESS (HPI)  The patient ***.  PAST PSYCHIATRIC HISTORY  *** Otherwise as per HPI above.  PAST MEDICAL HISTORY  Past Medical History:  Diagnosis Date   Anxiety    Arthritis    back (04/22/2018)   Atrial fibrillation with RVR (HCC) 10/21/2018   Back pain    Bacteremia due to Streptococcus  Salivarius 10/07/2018   Blood transfusion without reported diagnosis    CAD (coronary artery disease)    a. 03/2018 s/p PCI/DES to the RCA (3.0x15 Onyx DES).   Carotid artery stenosis    Mild   Cerebellar stroke, acute (HCC) 10/21/2018   Cerebrovascular accident (CVA) due to embolism of right posterior cerebral artery (HCC) 08/03/2020   Chest pain    CHF (congestive heart failure) (HCC)    Chronic lower back pain    Cirrhosis (HCC)    Colon polyps    Diverticulitis    Diverticulosis    Esophageal thickening    seen on pre TAVR CT scan, also questionable cirrhosis. MRI recommended. Will refer to GI   GERD (gastroesophageal reflux disease)    Gout    Grave's disease    H/O total shoulder replacement, left 06/01/2022   Heart murmur    History of cardioembolic stroke 03/29/2020   History of colonic polyps 05/22/2017   History of hiatal hernia    Hyperlipidemia    Hypertension    Hypothyroidism    IBS (irritable bowel syndrome)    Osteopenia    Parkinson's syndrome (HCC) 04/23/2020   Pulmonary nodules    seen on pre TAVR CT. likley benign. no follow up recommended if pt low risk.   S/P TAVR (transcatheter aortic valve replacement)    Shortness of breath on exertion    Stroke (HCC) 2021   x2   Thalassemia minor    Thyroid  disease    Type II diabetes mellitus (HCC)    ***  HOME MEDICATIONS  Facility Ordered Medications  Medication   amitriptyline  (ELAVIL ) tablet 25 mg   apixaban  (ELIQUIS ) tablet 5 mg   diazepam  (VALIUM ) tablet 2 mg  diltiazem  (CARDIZEM  CD) 24 hr capsule 120 mg   torsemide  (DEMADEX ) tablet 40 mg   spironolactone  (ALDACTONE ) tablet 12.5 mg   potassium chloride  SA (KLOR-CON  M) CR tablet 20 mEq   polyethylene glycol (MIRALAX  / GLYCOLAX ) packet 17 g   pantoprazole  (PROTONIX ) EC tablet 40 mg   ondansetron  (ZOFRAN ) tablet 4 mg   oxyCODONE  (Oxy IR/ROXICODONE ) immediate release tablet 5 mg   multivitamin with minerals tablet 1 tablet   metFORMIN   (GLUCOPHAGE -XR) 24 hr tablet 500 mg   Insulin  NPH (Human) (Isophane) (HUMULIN  N) Kwikpen 10 Units   insulin  aspart (novoLOG ) injection 5 Units   bisacodyl  (DULCOLAX) EC tablet 5 mg   allopurinol  (ZYLOPRIM ) tablet 100 mg   levothyroxine  (SYNTHROID ) tablet 137 mcg   [COMPLETED] LORazepam  (ATIVAN ) injection 0.5 mg   haloperidol  lactate (HALDOL ) injection 2.5 mg   PTA Medications  Medication Sig   Multiple Vitamin (MULITIVITAMIN WITH MINERALS) TABS Take 1 tablet by mouth daily.   b complex vitamins capsule Take 1 capsule by mouth daily.   metFORMIN  (GLUCOPHAGE -XR) 500 MG 24 hr tablet Take 1 tablet by mouth 2 times daily   Vibegron  (GEMTESA ) 75 MG TABS Take 75 mg by mouth at bedtime.   polyethylene glycol (MIRALAX ) 17 g packet Take 17 g by mouth daily as needed.   tiZANidine  (ZANAFLEX ) 4 MG tablet Take 1 tablet (4 mg total) by mouth 3 (three) times daily. (Patient taking differently: Take 4 mg by mouth daily as needed for muscle spasms.)   amitriptyline  (ELAVIL ) 25 MG tablet Take 1 tablet (25 mg total) by mouth at bedtime. (Patient taking differently: Take 25 mg by mouth at bedtime as needed for sleep (Headaches).)   SYNTHROID  137 MCG tablet Take 1 tablet (137 mcg total) by mouth every morning on empty stomach.   diltiazem  (CARTIA  XT) 120 MG 24 hr capsule Take 1 capsule (120 mg total) by mouth daily.   apixaban  (ELIQUIS ) 5 MG TABS tablet Take 1 tablet (5 mg total) by mouth 2 (two) times daily.   allopurinol  (ZYLOPRIM ) 100 MG tablet Take 1 tablet (100 mg total) by mouth daily.   colchicine  0.6 MG tablet Take 1 tablet (0.6 mg total) by mouth daily. as needed for gout   vitamin E  180 MG (400 UNITS) capsule Take 400 Units by mouth daily.   Cholecalciferol  (D3 PO) Take 1 tablet by mouth daily.   ondansetron  (ZOFRAN ) 4 MG tablet Take 1 tablet (4 mg total) by mouth every 8 (eight) hours as needed for nausea or vomiting.   omeprazole  (PRILOSEC) 20 MG capsule Take 1 capsule (20 mg total) by mouth daily.    acetaminophen  (TYLENOL ) 500 MG tablet Take 1 tablet (500 mg total) by mouth 4 (four) times daily.   potassium chloride  SA (KLOR-CON  M) 20 MEQ tablet Take 0.5 tablets (10 mEq total) by mouth daily. Take in concurrence with torsemide . (Patient taking differently: Take 20 mEq by mouth daily. Take in concurrence with torsemide .)   hydrOXYzine  (VISTARIL ) 25 MG capsule Take 1 capsule (25 mg total) by mouth every 8 (eight) hours as needed.   bisacodyl  (DULCOLAX) 5 MG EC tablet Take 1 Tablet with each dose pf pain medicine.   nitroGLYCERIN  (NITROSTAT ) 0.4 MG SL tablet Place 1 tablet (0.4 mg total) under the tongue every 5 (five) minutes as needed for chest pain.   oxyCODONE  (OXY IR/ROXICODONE ) 5 MG immediate release tablet Take 5 mg by mouth every 6 (six) hours as needed for severe pain (pain score 7-10).   torsemide  (  DEMADEX ) 20 MG tablet Take 2 tablets (40 mg total) by mouth daily.   spironolactone  (ALDACTONE ) 25 MG tablet Take 0.5 tablets (12.5 mg total) by mouth daily.   cephALEXin  (KEFLEX ) 500 MG capsule Take 1 capsule (500 mg total) by mouth 2 (two) times daily for 7 days.   Insulin  NPH, Human,, Isophane, (HUMULIN  N KWIKPEN) 100 UNIT/ML Kiwkpen Inject 20 Units into the skin in the morning and at bedtime. Decrease your dose to 10 units every morning, and 7 units every evening still appetite back to normal   insulin  regular (HUMULIN  R) 100 units/mL injection Please decrease your dose to 5 units 3 times daily before meals until appetite picks up back to normal   diazepam  (VALIUM ) 2 MG tablet Take 1 tablet (2 mg total) by mouth daily as needed for up to 10 doses for anxiety.   ***  ALLERGIES  Allergies  Allergen Reactions   Statins Other (See Comments)    Tried simvastatin , atorvastatin ; on Repatha     SOCIAL & SUBSTANCE USE HISTORY  Social History   Socioeconomic History   Marital status: Married    Spouse name: Not on file   Number of children: 2   Years of education: Not on file   Highest  education level: Associate degree: academic program  Occupational History   Occupation: Health And Safety Inspector of the [employer]  Tobacco Use   Smoking status: Never    Passive exposure: Never   Smokeless tobacco: Never  Vaping Use   Vaping status: Never Used  Substance and Sexual Activity   Alcohol use: No    Alcohol/week: 0.0 standard drinks of alcohol   Drug use: Never   Sexual activity: Not Currently  Other Topics Concern   Not on file  Social History Narrative   Lives with husband in a one story home.     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   Education: college.   Social Drivers of Health   Financial Resource Strain: Patient Declined (12/09/2022)   Overall Financial Resource Strain (CARDIA)    Difficulty of Paying Living Expenses: Patient declined  Food Insecurity: No Food Insecurity (05/24/2023)   Hunger Vital Sign    Worried About Running Out of Food in the Last Year: Never true    Ran Out of Food in the Last Year: Never true  Transportation Needs: No Transportation Needs (05/24/2023)   PRAPARE - Administrator, Civil Service (Medical): No    Lack of Transportation (Non-Medical): No  Physical Activity: Unknown (12/09/2022)   Exercise Vital Sign    Days of Exercise per Week: Patient declined    Minutes of Exercise per Session: 40 min  Stress: No Stress Concern Present (05/02/2023)   Received from [facility] of Occupational Health - Occupational Stress Questionnaire    Feeling of Stress : Not at all  Social Connections: Unknown (12/09/2022)   Social Connection and Isolation Panel [NHANES]    Frequency of Communication with Friends and Family: More than three times a week    Frequency of Social Gatherings with Friends and Family: Patient declined    Attends Religious Services: Patient declined    Database Administrator or Organizations: Patient declined     Attends Engineer, Structural: More than 4 times per year    Marital Status: Married   Social History   Tobacco Use  Smoking Status Never  Passive exposure: Never  Smokeless Tobacco Never   Social History   Substance and Sexual Activity  Alcohol Use No   Alcohol/week: 0.0 standard drinks of alcohol   Social History   Substance and Sexual Activity  Drug Use Never    Additional pertinent information ***.  FAMILY HISTORY  Family History  Adopted: Yes  Problem Relation Age of Onset   Healthy Son        x 2   Headache Other        Cluster headaches   Heart failure Mother    Colon cancer Neg Hx    Pancreatic cancer Neg Hx    Rectal cancer Neg Hx    Stomach cancer Neg Hx    Family Psychiatric History (if known):  ***  MENTAL STATUS EXAM (MSE)  Mental Status Exam: General Appearance: {Appearance:22683}  Orientation:  {BHH ORIENTATION (PAA):22689}  Memory:  {BHH MEMORY:22881}  Concentration:  {Concentration:21399}  Recall:  {BHH GOOD/FAIR/POOR:22877}  Attention  {BH Attention Span:31825}  Eye Contact:  {BHH EYE CONTACT:22684}  Speech:  {Speech:22685}  Language:  {BHH GOOD/FAIR/POOR:22877}  Volume:  {Volume (PAA):22686}  Mood: ***  Affect:  {Affect (PAA):22687}  Thought Process:  {Thought Process (PAA):22688}  Thought Content:  {Thought Content:22690}  Suicidal Thoughts:  {ST/HT (PAA):22692}  Homicidal Thoughts:  {ST/HT (PAA):22692}  Judgement:  {Judgement (PAA):22694}  Insight:  {Insight (PAA):22695}  Psychomotor Activity:  {Psychomotor (PAA):22696}  Akathisia:  {BHH YES OR NO:22294}  Fund of Knowledge:  {BHH GOOD/FAIR/POOR:22877}    Assets:  {Assets (PAA):22698}  Cognition:  {chl bhh cognition:304700322}  ADL's:  {BHH JIO'D:77709}  AIMS (if indicated):       VITALS  There were no vitals taken for this visit.  LABS  Admission on 05/27/2023  Component Date Value Ref Range Status   WBC 05/27/2023 6.8  4.0 - 10.5 K/uL Final   RBC 05/27/2023 5.58  (H)  3.87 - 5.11 MIL/uL Final   Hemoglobin 05/27/2023 10.1 (L)  12.0 - 15.0 g/dL Final   HCT 97/96/7974 32.2 (L)  36.0 - 46.0 % Final   MCV 05/27/2023 57.7 (L)  80.0 - 100.0 fL Final   MCH 05/27/2023 18.1 (L)  26.0 - 34.0 pg Final   MCHC 05/27/2023 31.4  30.0 - 36.0 g/dL Final   RDW 97/96/7974 17.3 (H)  11.5 - 15.5 % Final   Platelets 05/27/2023 158  150 - 400 K/uL Final   nRBC 05/27/2023 0.0  0.0 - 0.2 % Final   Performed at [facility], [address] [place], [place] [address]   Sodium 05/27/2023 137  135 - 145 mmol/L Final   Potassium 05/27/2023 3.5  3.5 - 5.1 mmol/L Final   Chloride 05/27/2023 103  98 - 111 mmol/L Final   CO2 05/27/2023 18 (L)  22 - 32 mmol/L Final   Glucose, Bld 05/27/2023 166 (H)  70 - 99 mg/dL Final   Glucose reference range applies only to samples taken after fasting for at least 8 hours.   BUN 05/27/2023 22  8 - 23 mg/dL Final   Creatinine, Ser 05/27/2023 1.11 (H)  0.44 - 1.00 mg/dL Final   Calcium  05/27/2023 10.6 (H)  8.9 - 10.3 mg/dL Final   Total Protein 97/96/7974 7.1  6.5 - 8.1 g/dL Final   Albumin  05/27/2023 3.9  3.5 - 5.0 g/dL Final   AST 97/96/7974 38  15 - 41 U/L Final   ALT 05/27/2023 27  0 - 44 U/L Final   Alkaline Phosphatase 05/27/2023  67  38 - 126 U/L Final   Total Bilirubin 05/27/2023 1.4 (H)  0.0 - 1.2 mg/dL Final   GFR, Estimated 05/27/2023 51 (L)  >60 mL/min Final   Comment: (NOTE) Calculated using the CKD-EPI Creatinine Equation (2021)    Anion gap 05/27/2023 16 (H)  5 - 15 Final   Performed at [facility], [address] [place], [place] [address]   Color, Urine 05/27/2023 YELLOW  YELLOW Final   APPearance 05/27/2023 CLEAR  CLEAR Final   Specific Gravity, Urine 05/27/2023 1.010  1.005 - 1.030 Final   pH 05/27/2023 6.0  5.0 - 8.0 Final   Glucose, UA 05/27/2023 NEGATIVE  NEGATIVE mg/dL Final   Hgb urine dipstick 05/27/2023 NEGATIVE  NEGATIVE Final   Bilirubin Urine 05/27/2023 NEGATIVE  NEGATIVE Final   Ketones, ur  05/27/2023 NEGATIVE  NEGATIVE mg/dL Final   Protein, ur 97/96/7974 NEGATIVE  NEGATIVE mg/dL Final   Nitrite 97/96/7974 NEGATIVE  NEGATIVE Final   Leukocytes,Ua 05/27/2023 NEGATIVE  NEGATIVE Final   Performed at [facility], [address], [place], [place] [address]   B Natriuretic Peptide 05/27/2023 187.8 (H)  0.0 - 100.0 pg/mL Final   Performed at [facility], [address], [place], [place] [address]   Troponin I (High Sensitivity) 05/27/2023 44 (H)  <18 ng/L Final   Comment: (NOTE) Elevated high sensitivity troponin I (hsTnI) values and significant  changes across serial measurements may suggest ACS but many other  chronic and acute conditions are known to elevate hsTnI results.  Refer to the Links section for chest pain algorithms and additional  guidance. Performed at [facility], [address], [place], [place] [address]    Troponin I (High Sensitivity) 05/27/2023 46 (H)  <18 ng/L Final   Comment: (NOTE) Elevated high sensitivity troponin I (hsTnI) values and significant  changes across serial measurements may suggest ACS but many other  chronic and acute conditions are known to elevate hsTnI results.  Refer to the Links section for chest pain algorithms and additional  guidance. Performed at [facility], [address], [place], [place] [address]    Glucose-Capillary 05/27/2023 131 (H)  70 - 99 mg/dL Final   Glucose reference range applies only to samples taken after fasting for at least 8 hours.   SARS Coronavirus 2 by RT PCR 05/27/2023 NEGATIVE  NEGATIVE Final   Influenza A by PCR 05/27/2023 NEGATIVE  NEGATIVE Final   Influenza B by PCR 05/27/2023 NEGATIVE  NEGATIVE Final   Comment: (NOTE) The Xpert Xpress SARS-CoV-2/FLU/RSV plus assay is intended as an aid in the diagnosis of influenza from Nasopharyngeal swab specimens and should not be used as a sole basis for treatment. Nasal washings and aspirates are unacceptable for Xpert Xpress  SARS-CoV-2/FLU/RSV testing.  Fact Sheet for Patients: bloggercourse.com  Fact Sheet for Healthcare Providers: seriousbroker.it  This test is not yet approved or cleared by the [place]  FDA and has been authorized for detection and/or diagnosis of SARS-CoV-2 by FDA under an Emergency Use Authorization (EUA). This EUA will remain in effect (meaning this test can be used) for the duration of the COVID-19 declaration under Section 564(b)(1) of the Act, 21 U.S.C. section 360bbb-3(b)(1), unless the authorization is terminated or revoked.     Resp Syncytial Virus by PCR 05/27/2023 NEGATIVE  NEGATIVE Final   Comment: (NOTE) Fact Sheet for Patients: bloggercourse.com  Fact Sheet for Healthcare Providers: seriousbroker.it  This test is not yet approved  or cleared by the [place]  FDA and has been authorized for detection and/or diagnosis of SARS-CoV-2 by FDA under an Emergency Use Authorization (EUA). This EUA will remain in effect (meaning this test can be used) for the duration of the COVID-19 declaration under Section 564(b)(1) of the Act, 21 U.S.C. section 360bbb-3(b)(1), unless the authorization is terminated or revoked.  Performed at [facility], [address] [place], [place] [address]     PSYCHIATRIC REVIEW OF SYSTEMS (ROS)  ROS: Notable for the following relevant positive findings: ROS  Additional findings:      Musculoskeletal: {Musculoskeletal neeeds/assessment:304550014}      Gait & Station: {Gait and Station:304550016}      Pain Screening: {Pain Description:304550015}      Nutrition & Dental Concerns: {Nutrition & Dental Concerns:304550017}  RISK FORMULATION/ASSESSMENT  Is the patient experiencing any suicidal or homicidal ideations: {yes/no:20286}       Explain if yes: *** Protective factors considered for safety management: ***  Risk  factors/concerns considered for safety management: *** {CHL BH Risk Factors Safety Management:304550011}  Is there a safety management plan with the patient and treatment team to minimize risk factors and promote protective factors: {yes/no:20286}           Explain: *** Is crisis care placement or psychiatric hospitalization recommended: {yes/no:20286}     Based on my current evaluation and risk assessment, patient is determined at this time to be at:  {Risk level:304550009}  *RISK ASSESSMENT Risk assessment is a dynamic process; it is possible that this patient's condition, and risk level, may change. This should be re-evaluated and managed over time as appropriate. Please re-consult psychiatric consult services if additional assistance is needed in terms of risk assessment and management. If your team decides to discharge this patient, please advise the patient how to best access emergency psychiatric services, or to call 911, if their condition worsens or they feel unsafe in any way.   Ines Hock, [facility] Consult Services

## 2023-05-28 NOTE — ED Notes (Signed)
Pt transferred to recliner via Steady assistive device. Assisted pt with meal tray and pt ate 20%.

## 2023-05-28 NOTE — Telephone Encounter (Signed)
 Called Signe again no answer. Called pt spouse by accident. Per pt spouse pt is back at the ED. He informed me that a physician at the ED advised PCP to go over medication to see what can be eliminated. According to pt spouse he think that some of her medications are causing some of her issues of mood swings.

## 2023-05-28 NOTE — ED Notes (Signed)
Pt very anxious at this time, uncontrollably crying. This RN attempted supportive listening and decreasing stimulation.

## 2023-05-28 NOTE — Telephone Encounter (Signed)
Lm for Breanna Webster to return call.

## 2023-05-28 NOTE — Discharge Planning (Signed)
 RNCM consulted regarding safe discharge planning (Home with Home Health vs Skilled Nursing Facility Placement).  Physical Therapy evaluation placed; will follow up after recommendations from PT.     Pt currently active with [facility] for Home Health services RN, PT as confirmed by [facility] with [facility].  Pt will resume HH services with [facility] should that be her discharge plan.  No DME needs identified at this time.

## 2023-05-28 NOTE — TOC Progression Note (Signed)
 Transition of Care [facility]) - Progression Note    Patient Details  Name: RAYMONA BOSS MRN: [id number] Date of Birth: [birth year]  Transition of Care [facility]) CM/SW Contact  Hartley KATHEE Treasure ISRAEL Phone Number: 05/28/2023, 6:09 PM  Clinical Narrative:     CSW spoke with pt's spouse, no bed offers at this time but answered questions, pt's spouse nervous, CSW provided explanation to process, no other questions asked. TOC will continue to follow and provide bed offers when available.        Expected Discharge Plan and Services                                               Social Determinants of Health (SDOH) Interventions SDOH Screenings   Food Insecurity: No Food Insecurity (05/24/2023)  Housing: Low Risk  (05/24/2023)  Transportation Needs: No Transportation Needs (05/24/2023)  Utilities: Not At Risk (05/24/2023)  Alcohol Screen: Low Risk  (08/29/2022)  Depression (PHQ2-9): Low Risk  (08/29/2022)  Financial Resource Strain: Patient Declined (12/09/2022)  Physical Activity: Unknown (12/09/2022)  Social Connections: Unknown (12/09/2022)  Stress: No Stress Concern Present (05/02/2023)   Received from [facility]  Tobacco Use: Low Risk  (05/27/2023)    Readmission Risk Interventions     No data to display

## 2023-05-28 NOTE — Care Management (Signed)
 Received inbound call from patient's husband requesting to speak with dayshift RNCM, as he is returning a call from her. Per chart review patient is being worked up for short term SNF. Patient maybe eligible for [facility] waiver. This RNCM spoke with patient's husband to explain short term SNF placement process and advised TOC will follow up with him to make bed offers once [facility] waiver is confirmed. Patient's husband Breanna Webster request a call back at [phone], will forward to [facility] ED SW.  TOC will continue to follow.

## 2023-05-28 NOTE — Telephone Encounter (Signed)
Appointment cx, pt in hospital spouse will call back to r/s

## 2023-05-28 NOTE — Telephone Encounter (Signed)
 Copied from CRM [id number]. Topic: General - Other >> May 28, 2023 10:19 AM Pinkey ORN wrote: Reason for CRM: Home Health Verbal Order (Return Call) >> May 28, 2023 10:38 AM Pinkey ORN wrote: Signe Eastern returning a call on behalf of patient home health verbal orders. It was noted that Vicci Leader R CMA returned his call and left a voicemail that he never received. Signe Eastern is requesting a call back at [phone] and requesting that if he's not available, that a message be left accessible for the agent in case he has to call back, Signe Eastern is requesting a simple YES OR NO to the orders.

## 2023-05-28 NOTE — ED Notes (Signed)
Pt is awake and calm at this time.

## 2023-05-28 NOTE — Evaluation (Signed)
 Physical Therapy Evaluation Patient Details Name: Breanna Webster MRN: [id number] DOB: 07-Jan-1946 Today's Date: 05/28/2023  History of Present Illness  78 y.o. female presents to [facility] hospital on 05/27/2023 with worsening anxiety and sundowning at night. PMH includes GAD, CAD, afib, chronic back pain. CHF, CVA, GERD, Parkinson's syndrome, DMII.  Clinical Impression  Pt presents to PT with deficits in functional mobility, gait, balance, strength, power, cognition, endurance. Pt is only oriented to person at this time but does appear to have fair awareness of her current mobility deficits and high risk for falls. Pt reports a lack of sleep recently due to anxiety at night. Pt is lethargic on my evaluation, likely due to this lack of sleep along with receiving multiple medications in the last few hours which could be contributing to reduce level of arousal. Pt is very unsteady at this time and requires physical assistance for all aspects of functional mobility. If the pt;s spouse is unable to provide physical assistance for all functional mobility the pt will likely need short term inpatient PT placement or increased assistance at home.  Due to pt's recent anxiety at night transitioning to a short term inpatient rehab could result in an increase in anxiety due to the pt being in an unfamiliar environment, however if the pt's spouse is unable to safely care for her, and other caregiver support is not available in the home, this may be the best option at this time.      If plan is discharge home, recommend the following: A lot of help with walking and/or transfers;A lot of help with bathing/dressing/bathroom;Assistance with cooking/housework;Direct supervision/assist for medications management;Assist for transportation;Direct supervision/assist for financial management;Help with stairs or ramp for entrance;Supervision due to cognitive status   Can travel by private vehicle   Yes    Equipment  Recommendations None recommended by PT  Recommendations for Other Services       Functional Status Assessment Patient has had a recent decline in their functional status and demonstrates the ability to make significant improvements in function in a reasonable and predictable amount of time.     Precautions / Restrictions Precautions Precautions: Fall Precaution Comments: sundowning, anxiety Restrictions Weight Bearing Restrictions Per Provider Order: No      Mobility  Bed Mobility Overal bed mobility: Needs Assistance Bed Mobility: Supine to Sit, Sit to Supine     Supine to sit: Mod assist, HOB elevated Sit to supine: Max assist        Transfers Overall transfer level: Needs assistance Equipment used: Straight cane Transfers: Sit to/from Stand Sit to Stand: Contact guard assist                Ambulation/Gait Ambulation/Gait assistance: Mod assist Gait Distance (Feet): 8 Feet Assistive device: Straight cane Gait Pattern/deviations: Step-to pattern Gait velocity: reduced Gait velocity interpretation: <1.31 ft/sec, indicative of household ambulator   General Gait Details: short step-to gait, increased lateral sway in all directions. physical assist to correct losses of balance when turning  Stairs            Wheelchair Mobility     Tilt Bed    Modified Rankin (Stroke Patients Only)       Balance Overall balance assessment: Needs assistance Sitting-balance support: No upper extremity supported, Feet supported Sitting balance-Leahy Scale: Fair     Standing balance support: Single extremity supported, Reliant on assistive device for balance Standing balance-Leahy Scale: Poor Standing balance comment: minA  Pertinent Vitals/Pain Pain Assessment Pain Assessment: Faces Faces Pain Scale: Hurts even more Pain Location: back Pain Descriptors / Indicators: Aching Pain Intervention(s): Monitored during  session    Home Living Family/patient expects to be discharged to:: Private residence Living Arrangements: Spouse/significant other Available Help at Discharge: Family;Available 24 hours/day Type of Home: House Home Access: Level entry       Home Layout: One level Home Equipment: Cane - single Librarian, Academic (2 wheels);Shower seat;Wheelchair - manual      Prior Function Prior Level of Function : Needs assist             Mobility Comments: ambulatory with SPC ADLs Comments: PRN assistance from spouse for ADLs     Extremity/Trunk Assessment   Upper Extremity Assessment Upper Extremity Assessment: Generalized weakness    Lower Extremity Assessment Lower Extremity Assessment: Generalized weakness    Cervical / Trunk Assessment Cervical / Trunk Assessment: Kyphotic  Communication   Communication Communication: No apparent difficulties Cueing Techniques: Verbal cues  Cognition Arousal: Lethargic Behavior During Therapy: Flat affect Overall Cognitive Status: No family/caregiver present to determine baseline cognitive functioning                                 General Comments: pt is oriented to person only. Pt does have awareness of her current mobility deficits and demonstrates a fear of falling at this time which seems appropriate considering her current instability.        General Comments General comments (skin integrity, edema, etc.): VSS on RA, pt is drowsy throughout session. Pt appears to have received valium , haldol , oxycodone , and ativan  all within the last 4 hours of PT evaluation, along with lack of sleep in recent days due to sundowning and nighttime anxiety.    Exercises     Assessment/Plan    PT Assessment Patient needs continued PT services  PT Problem List Decreased strength;Decreased activity tolerance;Decreased balance;Decreased mobility;Decreased cognition;Decreased knowledge of use of DME;Decreased safety  awareness;Decreased knowledge of precautions       PT Treatment Interventions DME instruction;Gait training;Therapeutic activities;Functional mobility training;Therapeutic exercise;Balance training;Neuromuscular re-education;Cognitive remediation;Patient/family education    PT Goals (Current goals can be found in the Care Plan section)  Acute Rehab PT Goals Patient Stated Goal: to improve balance PT Goal Formulation: With patient Time For Goal Achievement: 06/11/23 Potential to Achieve Goals: Fair    Frequency Min 1X/week     Co-evaluation               AM-PAC PT 6 Clicks Mobility  Outcome Measure Help needed turning from your back to your side while in a flat bed without using bedrails?: A Lot Help needed moving from lying on your back to sitting on the side of a flat bed without using bedrails?: A Lot Help needed moving to and from a bed to a chair (including a wheelchair)?: A Lot Help needed standing up from a chair using your arms (e.g., wheelchair or bedside chair)?: A Little Help needed to walk in hospital room?: A Lot Help needed climbing 3-5 steps with a railing? : Total 6 Click Score: 12    End of Session Equipment Utilized During Treatment: Gait belt Activity Tolerance: Patient limited by fatigue;Patient limited by lethargy Patient left: in bed (on hallway stretcher) Nurse Communication: Mobility status PT Visit Diagnosis: Other abnormalities of gait and mobility (R26.89);Muscle weakness (generalized) (M62.81)    Time: 8596-8577 PT Time Calculation (min) (ACUTE  ONLY): 19 min   Charges:   PT Evaluation $PT Eval Low Complexity: 1 Low   PT General Charges $$ ACUTE PT VISIT: 1 Visit         Bernardino JINNY Ruth, PT, DPT Acute Rehabilitation Office [phone]   Bernardino JINNY Ruth 05/28/2023, 2:33 PM

## 2023-05-28 NOTE — ED Notes (Addendum)
Pt is anxious and exhibiting panic. She is screaming that she can't do this. Pt is able to be consoled temporarily, but immediately returns to screaming. Pt is A/Ox4. Pt also c/o back pain at this time. Pt repositioned. EDP notified. See new orders.

## 2023-05-28 NOTE — Progress Notes (Signed)
 CSW spoke with RN who states is requiring assistance from 1-2 people right now and may need SNF placement.  Patient has traditional Medicare and is eligible for the [facility] ACO REACH waiver.  Patient will need to be evaluated by PT to evaluate what level of care is recommended.  Niels Portugal, MSW, LCSW Transitions of Care  Clinical Social Worker II [phone]

## 2023-05-28 NOTE — Inpatient Diabetes Management (Signed)
Inpatient Diabetes Program Recommendations  AACE/ADA: New Consensus Statement on Inpatient Glycemic Control (2015)  Target Ranges:  Prepandial:   less than 140 mg/dL      Peak postprandial:   less than 180 mg/dL (1-2 hours)      Critically ill patients:  140 - 180 mg/dL   Lab Results  Component Value Date   GLUCAP 209 (H) 05/28/2023   HGBA1C 6.7 (H) 05/20/2023    Review of Glycemic Control  Latest Reference Range & Units 05/27/23 17:34 05/28/23 08:56 05/28/23 11:42  Glucose-Capillary 70 - 99 mg/dL 098 (H) 119 (H) 147 (H)  (H): Data is abnormally high  Diabetes history: DM2 Outpatient Diabetes medications: NPH 20 units BID, Metformin 500 mg BID, Humulin R 5 units TID Current orders for Inpatient glycemic control: NPH 10 units QAM, 7 units at bedtime, Novolog 5 unist TID, Metformin 500 mg BID  Inpatient Diabetes Program Recommendations:    Please consider discontinuing Novolog 5 units TID for now while in the ED.    Will continue to follow while inpatient.  Thank you, Dulce Sellar, MSN, CDCES Diabetes Coordinator Inpatient Diabetes Program [phone] (team pager from 8a-5p)

## 2023-05-28 NOTE — ED Notes (Signed)
Pt ambulated to restroom with 1 assist. Had BM & was able to ambulate back to stretcher

## 2023-05-28 NOTE — ED Notes (Signed)
Pt sleeping at this time and family at bedside. Pt's AM meds held to allow pt to rest.

## 2023-05-28 NOTE — Progress Notes (Signed)
 Transition of Care [facility]) - Inpatient Brief Assessment   Patient Details  Name: Breanna Webster MRN: [id number] Date of Birth: 03-Jul-1945  Transition of Care [facility]) CM/SW Contact:    Debarah Saunas, RN Phone Number: 05/28/2023, 2:13 PM   Clinical Narrative: RNCM met with pt family (husband, son and daughter-in-law) at bedside regarding discharge plans.  Husband, Charlie states that pt is very deconditioned and required more assistance than he and home health services can give.  [name] gives [facility] permission to seek skilled nursing placement if recommended by PT.  TOC will continue to follow.   Transition of Care Asessment: Insurance and Status: (P) Insurance coverage has been reviewed Patient has primary care physician: (P) Yes Home environment has been reviewed: (P) Home with spouse Prior level of function:: (P) home health services Recruitment Consultant) Prior/Current Home Services: (P) Current home services Social Drivers of Health Review: (P) SDOH reviewed no interventions necessary Readmission risk has been reviewed: (P) Yes Transition of care needs: (P) transition of care needs identified, TOC will continue to follow

## 2023-05-28 NOTE — Consult Note (Signed)
 [facility] Psychiatric Consult Initial  Patient Name: .Breanna Webster  MRN: [id number]  DOB: 08/28/1945  Consult Order details:  Orders (From admission, onward)     Start     Ordered   05/27/23 2319  CONSULT TO CALL ACT TEAM       Comments: Agitation, anxiety -Geri psych evaluation  Ordering Provider: Laurice Maude BROCKS, MD  Provider:  (Not yet assigned)  Question:  Reason for Consult?  Answer:  Psych consult   05/27/23 2319             Mode of Visit: In person    Psychiatry Consult Evaluation  Service Date: May 28, 2023 LOS:  LOS: 0 days  Chief Complaint panic attacks at night  Primary Psychiatric Diagnoses  Encounter for psychiatric evaluation 2.  GAD 3.    Assessment  Breanna Webster is a 78 y.o. female admitted: Presented to the EDfor 05/27/2023  3:10 PM for medical presentation as well as recent sun downing requesting psychiatric review. She carries the psychiatric diagnoses of GAD and has a past medical history of  see problem list.    On initial examination, patient is pleasant, cooperative, and slightly disoriented. Please see plan below for detailed recommendations.   Diagnoses:  Active Hospital problems: Active Problems:   * No active hospital problems. *    Plan   ## Psychiatric Medication Recommendations:  Seroquel  25 mg Q evening  ## Medical Decision Making Capacity: Not specifically addressed in this encounter  ## Further Work-up:  -- EKG pending -- Pertinent labwork reviewed earlier this admission includes: CT head, CBC, CMP, UDS, UA   ## Disposition:-- Seroquel  initiation to treat psychiatric symptoms associated with her sun downing. Will recommend patient is observed overnight for Seroquel  effectiveness, and reevaluated by psychiatry tomorrow.   ## Behavioral / Environmental: -Delirium Precautions: Delirium Interventions for Nursing and Staff: - RN to open blinds every AM. - To Bedside: Glasses, hearing aide, and pt's own shoes. Make  available to patients. when possible and encourage use. - Encourage po fluids when appropriate, keep fluids within reach. - OOB to chair with meals. - Passive ROM exercises to all extremities with AM & PM care. - RN to assess orientation to person, time and place QAM and PRN. - Recommend extended visitation hours with familiar family/friends as feasible. - Staff to minimize disturbances at night. Turn off television when pt asleep or when not in use.    ## Safety and Observation Level:  - Based on my clinical evaluation, I estimate the patient to be at low risk of self harm in the current setting. - At this time, we recommend  routine. This decision is based on my review of the chart including patient's history and current presentation, interview of the patient, mental status examination, and consideration of suicide risk including evaluating suicidal ideation, plan, intent, suicidal or self-harm behaviors, risk factors, and protective factors. This judgment is based on our ability to directly address suicide risk, implement suicide prevention strategies, and develop a safety plan while the patient is in the clinical setting. Please contact our team if there is a concern that risk level has changed.  CSSR Risk Category:C-SSRS RISK CATEGORY: No Risk  Suicide Risk Assessment: Patient has following modifiable risk factors for suicide: GAD, which we are addressing by overnight observation.. Patient has following non-modifiable or demographic risk factors for suicide: none Patient has the following protective factors against suicide: Access to outpatient mental health care, Supportive family, Supportive friends,  Cultural, spiritual, or religious beliefs that discourage suicide, no history of suicide attempts, and no history of NSSIB  Thank you for this consult request. Recommendations have been communicated to the primary team.  We will recommend overnight observation, with reevaluation by psychiatry  tomorrow at this time.   Nash Batter, NP       History of Present Illness  Relevant Aspects of Hospital ED Course:   Patient has presented to ED x3 times in the past two days for similar presentations of increased anxiety at night time. Pt does have psychiatric hx of GAD. For the past month, patient has been experiencing sun downing and in the evening becomes agitated, fearful, extremely anxious with reoccuring panic attacks, and very disoriented. Her husband continues to bring her into the ED since she continues to have these outbursts at night.   Today, patient presents to [facility] for second time in 24 hours due to condition above. Upon assessment with her, she is partially oriented. She is able to tell me her name, DOB, and current location, however unable to tell me current month or year. Pt states she is only hear because she has been having increased anxiety at bed time, but she is working on that with her outpatient doctor and would like to go home. She denies suicidal ideations. Denies hx of depression or suicide attempts. Denies HI. Denies AVH. She reports her only previous psychiatric diagnosis is GAD. She does admit to feeling worse in the evenings, but does not seem to have any immediately concerns. She does not appear to be currently in distress.   I spoke with her husband, [name], at [phone]. He feels like all of her medications are making her crazy. He does mention she is on at least 20 different medications, and they have not had a consistent outpatient doctor recently due to limited availability and appointments. They have had to see a few different OP doctors who have all been prescribing different medications. [name] states I am 78 years old. I'm doing my best but I don't even know what she should be taking there are so many medicines. My table looks like a pharmacy. He reported with additional of different medications over the months, patient has started  to sun down which started about 1 month ago. He said in the evening she is afraid, agitated, yelling, stating she is having panic attacks and that she can't breath. When she says she can't breath is when he brings her to the ED. He states it has been a continuous pattern of this for 1 month and he does not feel like he can take her home until she is more stable.   I spoke with him that she was given Seroquel  last night, and appears she responded well to this. Will prescribe her 25 mg to take in the evening to help target psychiatric presenting symptoms that could be associated with possible dementia or sun downing. He is agreeable to this. Attemtped to safety plan for possible discharge, but husband refused. Husband wants her to stay in the ED tonight to monitor effectiveness of Seroquel  with her sundowning outbursts. He states with his age it is difficult to help her, and he states if she were to act out again tonight I am just going to bring her right back like I have been doing. Please keep her tonight and see if the medication helps.   Patient has been seen in [place] frequently due to similar presentations of above information.  After speaking with husband, supervising MD Dr. Zouev, and EDP, will agree to start Seroquel  25 mg q evening, recommend overnight observation for effectiveness, and have psychiatry reevaluate in the morning.   Pt does have PRN haldol  to be given as well if needed.    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 Hx: none Living Situation: lives with husband Spiritual Hx: yes Access to weapons/lethal means: denies   Substance History Alcohol: denies  Illicit drugs: denies Prescription drug abuse: denies Rehab hx: denies  Exam Findings  Physical Exam:  Vital Signs:  Temp:  [97.4 F (36.3 C)-98.4 F (36.9 C)] 97.6 F (36.4 C) (02/04 0630) Pulse Rate:  [88-116] 99 (02/04 1113) Resp:  [17-22] 22 (02/04 1113) BP: (110-157)/(64-119) 119/73 (02/04 1113) SpO2:  [93 %-100 %] 98 % (02/04 1113) Weight:  [77.2 kg] 77.2 kg (02/03 1517) Blood pressure 119/73, pulse 99, temperature 97.6 F (36.4 C), temperature source Oral, resp. rate (!) 22, height 5' 5 (1.651 m), weight 77.2 kg, SpO2 98%. Body mass index is 28.32 kg/m.  Physical Exam Vitals and nursing note reviewed.  Neurological:     Mental Status: She is alert. She is disoriented.     Mental Status Exam: General Appearance: Well Groomed  Orientation:  Full (Time, Place, and Person)  Memory:  Immediate;   Fair Recent;   Poor  Concentration:  Concentration: Fair  Recall:  Fair  Attention  Fair  Eye Contact:  Fair  Speech:  Normal Rate  Language:  Fair  Volume:  Normal  Mood: fine  Affect:  Congruent  Thought Process:  Goal Directed  Thought Content:  WDL  Suicidal Thoughts:  No  Homicidal Thoughts:  No  Judgement:  Fair  Insight:  Fair  Psychomotor Activity:  Normal  Akathisia:  No  Fund of Knowledge:  Fair      Assets:  Communication Skills Desire for Improvement Housing Intimacy Leisure Time Resilience Social Support  Cognition:  WNL  ADL's:  Intact  AIMS (if indicated):        Other History   These have been pulled in through the EMR, reviewed, and updated if appropriate.  Family History:  The patient's family history includes Headache in an other family member; Healthy in her son; Heart failure in her mother. She was adopted.  Medical  History: Past Medical History:  Diagnosis Date   Anxiety    Arthritis    back (04/22/2018)   Atrial fibrillation with RVR (HCC) 10/21/2018   Back pain    Bacteremia due to Streptococcus Salivarius 10/07/2018   Blood transfusion without reported diagnosis    CAD (coronary artery disease)    a. 03/2018 s/p PCI/DES to the RCA (3.0x15 Onyx DES).   Carotid artery stenosis    Mild   Cerebellar stroke, acute (HCC) 10/21/2018   Cerebrovascular accident (CVA) due to embolism of right posterior cerebral artery (HCC) 08/03/2020   Chest pain    CHF (congestive heart failure) (HCC)    Chronic lower  back pain    Cirrhosis (HCC)    Colon polyps    Diverticulitis    Diverticulosis    Esophageal thickening    seen on pre TAVR CT scan, also questionable cirrhosis. MRI recommended. Will refer to GI   GERD (gastroesophageal reflux disease)    Gout    Grave's disease    H/O total shoulder replacement, left 06/01/2022   Heart murmur    History of cardioembolic stroke 03/29/2020   History of colonic polyps 05/22/2017   History of hiatal hernia    Hyperlipidemia    Hypertension    Hypothyroidism    IBS (irritable bowel syndrome)    Osteopenia    Parkinson's syndrome (HCC) 04/23/2020   Pulmonary nodules    seen on pre TAVR CT. likley benign. no follow up recommended if pt low risk.   S/P TAVR (transcatheter aortic valve replacement)    Shortness of breath on exertion    Stroke (HCC) 2021   x2   Thalassemia minor    Thyroid  disease    Type II diabetes mellitus (HCC)     Surgical History: Past Surgical History:  Procedure Laterality Date   55 HOUR PH STUDY N/A 03/03/2018   Procedure: 24 HOUR PH STUDY;  Surgeon: Shila Gustav GAILS, MD;  Location: [facility] ENDOSCOPY;  Service: Endoscopy;  Laterality: N/A;   COLONOSCOPY  2023   COLONOSCOPY W/ BIOPSIES AND POLYPECTOMY     CORONARY ANGIOGRAPHY Right 04/21/2018   Procedure: CORONARY ANGIOGRAPHY (CATH LAB);  Surgeon: Claudene Victory ORN, MD;   Location: [facility] INVASIVE CV LAB;  Service: Cardiovascular;  Laterality: Right;   CORONARY STENT INTERVENTION N/A 04/22/2018   Procedure: CORONARY STENT INTERVENTION;  Surgeon: Claudene Victory ORN, MD;  Location: [facility] INVASIVE CV LAB;  Service: Cardiovascular;  Laterality: N/A;   DILATION AND CURETTAGE OF UTERUS     ESOPHAGEAL MANOMETRY N/A 03/03/2018   Procedure: ESOPHAGEAL MANOMETRY (EM);  Surgeon: Shila Gustav GAILS, MD;  Location: [facility] ENDOSCOPY;  Service: Endoscopy;  Laterality: N/A;   HYSTEROSCOPY     fibroids   LAPAROSCOPIC CHOLECYSTECTOMY  1985   LAPAROSCOPY     fibroids   NISSEN FUNDOPLICATION  1990s   POLYPECTOMY     REVERSE SHOULDER ARTHROPLASTY Left 06/01/2022   Procedure: REVERSE SHOULDER ARTHROPLASTY;  Surgeon: Kay Kemps, MD;  Location: [facility] ORS;  Service: Orthopedics;  Laterality: Left;  choice with interscalene block   RIGHT/LEFT HEART CATH AND CORONARY ANGIOGRAPHY N/A 02/20/2018   Procedure: RIGHT/LEFT HEART CATH AND CORONARY ANGIOGRAPHY;  Surgeon: Claudene Victory ORN, MD;  Location: [facility] INVASIVE CV LAB;  Service: Cardiovascular;  Laterality: N/A;   TEE WITHOUT CARDIOVERSION N/A 07/08/2018   Procedure: TRANSESOPHAGEAL ECHOCARDIOGRAM (TEE);  Surgeon: Verlin Lonni BIRCH, MD;  Location: [facility] OR;  Service: Open Heart Surgery;  Laterality: N/A;   TEE WITHOUT CARDIOVERSION  10/07/2018   TEE WITHOUT CARDIOVERSION N/A 10/07/2018   Procedure: TRANSESOPHAGEAL ECHOCARDIOGRAM (TEE);  Surgeon: Jeffrie Oneil BROCKS, MD;  Location: [facility] ENDOSCOPY;  Service: Cardiovascular;  Laterality: N/A;   TONSILLECTOMY     age 19   TRANSCATHETER AORTIC VALVE REPLACEMENT, TRANSFEMORAL N/A 07/08/2018   Procedure: TRANSCATHETER AORTIC VALVE REPLACEMENT, TRANSFEMORAL;  Surgeon: Verlin Lonni BIRCH, MD;  Location: [facility] OR;  Service: Open Heart Surgery;  Laterality: N/A;     Medications:   Current Facility-Administered Medications:    allopurinol  (ZYLOPRIM ) tablet 100 mg, 100 mg, Oral, Daily, Messick, Peter C, MD    amitriptyline  (ELAVIL ) tablet 25 mg, 25 mg, Oral, QHS PRN, Messick, Peter C,  MD   apixaban  (ELIQUIS ) tablet 5 mg, 5 mg, Oral, BID, Messick, Peter C, MD   bisacodyl  (DULCOLAX) EC tablet 5 mg, 5 mg, Oral, Daily PRN, Laurice Maude BROCKS, MD   diazepam  (VALIUM ) tablet 2 mg, 2 mg, Oral, Daily PRN, Laurice Maude BROCKS, MD   diltiazem  (CARDIZEM  CD) 24 hr capsule 120 mg, 120 mg, Oral, Daily, Messick, Maude BROCKS, MD   haloperidol  lactate (HALDOL ) injection 2.5 mg, 2.5 mg, Intravenous, Once, Rancour, Stephen, MD   insulin  aspart (novoLOG ) injection 5 Units, 5 Units, Subcutaneous, TID WC, Messick, Maude BROCKS, MD   insulin  NPH Human (NOVOLIN  N) injection 10 Units, 10 Units, Subcutaneous, QAC breakfast, Messick, Maude BROCKS, MD   insulin  NPH Human (NOVOLIN  N) injection 7 Units, 7 Units, Subcutaneous, QHS, Messick, Maude BROCKS, MD   levothyroxine  (SYNTHROID ) tablet 137 mcg, 137 mcg, Oral, q morning, Laurice Maude BROCKS, MD, 137 mcg at 05/28/23 9368   metFORMIN  (GLUCOPHAGE -XR) 24 hr tablet 500 mg, 500 mg, Oral, BID, Messick, Peter C, MD   multivitamin with minerals tablet 1 tablet, 1 tablet, Oral, Daily, Messick, Maude BROCKS, MD   ondansetron  (ZOFRAN ) tablet 4 mg, 4 mg, Oral, Q8H PRN, Laurice Maude BROCKS, MD   oxyCODONE  (Oxy IR/ROXICODONE ) immediate release tablet 5 mg, 5 mg, Oral, Q6H PRN, Laurice Maude BROCKS, MD   pantoprazole  (PROTONIX ) EC tablet 40 mg, 40 mg, Oral, Daily, Messick, Maude BROCKS, MD   polyethylene glycol (MIRALAX  / GLYCOLAX ) packet 17 g, 17 g, Oral, Daily PRN, Laurice Maude BROCKS, MD   potassium chloride  SA (KLOR-CON  M) CR tablet 20 mEq, 20 mEq, Oral, Daily, Messick, Maude BROCKS, MD   QUEtiapine  (SEROQUEL ) tablet 25 mg, 25 mg, Oral, QHS, Jamie Hafford, Nash, NP   spironolactone  (ALDACTONE ) tablet 12.5 mg, 12.5 mg, Oral, Daily, Messick, Maude BROCKS, MD   torsemide  (DEMADEX ) tablet 40 mg, 40 mg, Oral, Daily, Messick, Maude BROCKS, MD  Current Outpatient Medications:    allopurinol  (ZYLOPRIM ) 100 MG tablet, Take 1 tablet (100 mg total) by mouth  daily., Disp: 90 tablet, Rfl: 1   amitriptyline  (ELAVIL ) 25 MG tablet, Take 1 tablet (25 mg total) by mouth at bedtime. (Patient taking differently: Take 25 mg by mouth at bedtime as needed for sleep (Headaches).), Disp: 90 tablet, Rfl: 1   apixaban  (ELIQUIS ) 5 MG TABS tablet, Take 1 tablet (5 mg total) by mouth 2 (two) times daily., Disp: 60 tablet, Rfl: 3   diltiazem  (CARTIA  XT) 120 MG 24 hr capsule, Take 1 capsule (120 mg total) by mouth daily., Disp: 90 capsule, Rfl: 3   hydrOXYzine  (VISTARIL ) 25 MG capsule, Take 1 capsule (25 mg total) by mouth every 8 (eight) hours as needed., Disp: 90 capsule, Rfl: 1   Insulin  NPH, Human,, Isophane, (HUMULIN  N KWIKPEN) 100 UNIT/ML Kiwkpen, Inject 20 Units into the skin in the morning and at bedtime. Decrease your dose to 10 units every morning, and 7 units every evening still appetite back to normal, Disp: , Rfl:    metFORMIN  (GLUCOPHAGE -XR) 500 MG 24 hr tablet, Take 1 tablet by mouth 2 times daily, Disp: 180 tablet, Rfl: 2   omeprazole  (PRILOSEC) 20 MG capsule, Take 1 capsule (20 mg total) by mouth daily., Disp: 90 capsule, Rfl: 3   ondansetron  (ZOFRAN ) 4 MG tablet, Take 1 tablet (4 mg total) by mouth every 8 (eight) hours as needed for nausea or vomiting., Disp: 40 tablet, Rfl: 1   spironolactone  (ALDACTONE ) 25 MG tablet, Take 0.5 tablets (12.5 mg total) by mouth daily., Disp: 15 tablet,  Rfl: 0   SYNTHROID  137 MCG tablet, Take 1 tablet (137 mcg total) by mouth every morning on empty stomach., Disp: 90 tablet, Rfl: 1   tiZANidine  (ZANAFLEX ) 4 MG tablet, Take 1 tablet (4 mg total) by mouth 3 (three) times daily. (Patient taking differently: Take 4 mg by mouth daily as needed for muscle spasms.), Disp: 90 tablet, Rfl: 1   torsemide  (DEMADEX ) 20 MG tablet, Take 2 tablets (40 mg total) by mouth daily., Disp: 60 tablet, Rfl: 0   acetaminophen  (TYLENOL ) 500 MG tablet, Take 1 tablet (500 mg total) by mouth 4 (four) times daily., Disp: 30 tablet, Rfl: 0   b complex  vitamins capsule, Take 1 capsule by mouth daily., Disp: , Rfl:    bisacodyl  (DULCOLAX) 5 MG EC tablet, Take 1 Tablet with each dose pf pain medicine., Disp: 100 tablet, Rfl: 1   cephALEXin  (KEFLEX ) 500 MG capsule, Take 1 capsule (500 mg total) by mouth 2 (two) times daily for 7 days., Disp: 14 capsule, Rfl: 0   Cholecalciferol  (D3 PO), Take 1 tablet by mouth daily., Disp: , Rfl:    colchicine  0.6 MG tablet, Take 1 tablet (0.6 mg total) by mouth daily. as needed for gout, Disp: 90 tablet, Rfl: 0   diazepam  (VALIUM ) 2 MG tablet, Take 1 tablet (2 mg total) by mouth daily as needed for up to 10 doses for anxiety., Disp: 10 tablet, Rfl: 0   insulin  regular (HUMULIN  R) 100 units/mL injection, Please decrease your dose to 5 units 3 times daily before meals until appetite picks up back to normal, Disp: , Rfl:    Multiple Vitamin (MULITIVITAMIN WITH MINERALS) TABS, Take 1 tablet by mouth daily., Disp: , Rfl:    nitroGLYCERIN  (NITROSTAT ) 0.4 MG SL tablet, Place 1 tablet (0.4 mg total) under the tongue every 5 (five) minutes as needed for chest pain., Disp: 25 tablet, Rfl: 4   oxyCODONE  (OXY IR/ROXICODONE ) 5 MG immediate release tablet, Take 5 mg by mouth every 6 (six) hours as needed for severe pain (pain score 7-10)., Disp: , Rfl:    polyethylene glycol (MIRALAX ) 17 g packet, Take 17 g by mouth daily as needed., Disp: , Rfl:    potassium chloride  SA (KLOR-CON  M) 20 MEQ tablet, Take 0.5 tablets (10 mEq total) by mouth daily. Take in concurrence with torsemide . (Patient taking differently: Take 20 mEq by mouth daily. Take in concurrence with torsemide .), Disp: , Rfl:    Vibegron  (GEMTESA ) 75 MG TABS, Take 75 mg by mouth at bedtime., Disp: , Rfl:    vitamin E  180 MG (400 UNITS) capsule, Take 400 Units by mouth daily., Disp: , Rfl:   Allergies: Allergies  Allergen Reactions   Statins Other (See Comments)    Tried simvastatin , atorvastatin ; on Repatha     Nash Batter, NP

## 2023-05-29 ENCOUNTER — Encounter (HOSPITAL_COMMUNITY): Payer: Self-pay | Admitting: Psychiatry

## 2023-05-29 ENCOUNTER — Other Ambulatory Visit (HOSPITAL_COMMUNITY): Payer: Self-pay

## 2023-05-29 DIAGNOSIS — R4189 Other symptoms and signs involving cognitive functions and awareness: Secondary | ICD-10-CM | POA: Diagnosis not present

## 2023-05-29 DIAGNOSIS — F419 Anxiety disorder, unspecified: Secondary | ICD-10-CM | POA: Diagnosis not present

## 2023-05-29 DIAGNOSIS — F05 Delirium due to known physiological condition: Secondary | ICD-10-CM | POA: Diagnosis not present

## 2023-05-29 DIAGNOSIS — R29818 Other symptoms and signs involving the nervous system: Secondary | ICD-10-CM | POA: Diagnosis not present

## 2023-05-29 DIAGNOSIS — R0602 Shortness of breath: Secondary | ICD-10-CM | POA: Diagnosis not present

## 2023-05-29 LAB — CBG MONITORING, ED
Glucose-Capillary: 166 mg/dL — ABNORMAL HIGH (ref 70–99)
Glucose-Capillary: 166 mg/dL — ABNORMAL HIGH (ref 70–99)

## 2023-05-29 MED ORDER — LACTULOSE 20 GM/30ML PO SOLN
15.0000 mL | Freq: Three times a day (TID) | ORAL | 0 refills | Status: DC
  Filled 2023-05-29: qty 946, 21d supply, fill #0

## 2023-05-29 MED ORDER — MIRABEGRON ER 25 MG PO TB24
25.0000 mg | ORAL_TABLET | Freq: Every day | ORAL | Status: DC
  Administered 2023-05-29: 25 mg via ORAL
  Filled 2023-05-29: qty 1

## 2023-05-29 MED ORDER — ACETAMINOPHEN 500 MG PO TABS
500.0000 mg | ORAL_TABLET | Freq: Four times a day (QID) | ORAL | Status: DC
  Administered 2023-05-29 (×2): 500 mg via ORAL
  Filled 2023-05-29 (×2): qty 1

## 2023-05-29 MED ORDER — LACTULOSE 10 GM/15ML PO SOLN
10.0000 g | Freq: Once | ORAL | Status: AC
  Administered 2023-05-29: 10 g via ORAL
  Filled 2023-05-29: qty 30

## 2023-05-29 MED ORDER — TIZANIDINE HCL 4 MG PO TABS
4.0000 mg | ORAL_TABLET | Freq: Every day | ORAL | Status: DC | PRN
  Administered 2023-05-29: 4 mg via ORAL
  Filled 2023-05-29: qty 1

## 2023-05-29 MED ORDER — NITROGLYCERIN 0.4 MG SL SUBL: 0.4000 mg | SUBLINGUAL_TABLET | SUBLINGUAL | Status: DC | PRN

## 2023-05-29 MED ORDER — QUETIAPINE FUMARATE 25 MG PO TABS
25.0000 mg | ORAL_TABLET | Freq: Every day | ORAL | 0 refills | Status: DC
  Filled 2023-05-29: qty 14, 14d supply, fill #0

## 2023-05-29 MED ORDER — OXYCODONE HCL 5 MG PO TABS
5.0000 mg | ORAL_TABLET | ORAL | Status: DC | PRN
  Administered 2023-05-29: 5 mg via ORAL
  Filled 2023-05-29: qty 1

## 2023-05-29 NOTE — ED Notes (Signed)
Family asks for walker and bedside commode due to mobility concerns. Request relayed to [facility] team.

## 2023-05-29 NOTE — ED Provider Notes (Addendum)
 Emergency Medicine Observation Re-evaluation Note  Breanna Webster is a 78 y.o. female, seen on rounds today.  Pt initially presented to the ED for complaints of Shortness of Breath Currently, the patient is calm and cooperative eating a light breakfast.  Physical Exam  BP (!) 106/54   Pulse 85   Temp 97.6 F (36.4 C) (Oral)   Resp 20   Ht 5' 5 (1.651 m)   Wt 77.2 kg   SpO2 99%   BMI 28.32 kg/m  Physical Exam General: Awake. Alert. No acute distress Cardiac: Regular rate rhythm Lungs: Clear to auscultation bilaterally Psych: Calm and cooperative  ED Course / MDM  EKG:EKG Interpretation Date/Time:  Tuesday May 28 2023 12:31:18 EST Ventricular Rate:  82 PR Interval:  196 QRS Duration:  174 QT Interval:  482 QTC Calculation: 563 R Axis:   -64  Text Interpretation: Normal sinus rhythm Right atrial enlargement Left bundle branch block Abnormal ECG When compared with ECG of 27-May-2023 15:26, No significant change was found Confirmed by Raford Lenis ([id number]) on 05/28/2023 11:33:42 PM  I have reviewed the labs performed to date as well as medications administered while in observation.  Recent changes in the last 24 hours include no acute events overnight.  Plan  Current plan is for continued boarding in the ED awaiting TTS, geriatric psych evaluation.  1207: Patient has been evaluated by psychiatric team with the following recommendations  #Unspecified neurocognitive impairment  # Unspecified anxiety  #R/O residual delirium  -Continue Seroquel  25 mg p.o. nightly  -Recommend close outpatient follow-up with neurology - Recommend close outpatient follow-up with psychiatry  -Recommend strict adherence to safety plan listed below and delirium precautions   Psychiatric team has discussed with the patient's family member who are amenable to her coming home.  They will pick up the patient in the early evening  1314: Patient's gastroenterologist (Dr. Leigh) was kind enough  to reach out on behalf of this patient.  He recommends initiating lactulose  in case there is any component of hepatic encephalopathy that may be causing her presentation here and continuing lactulose  when she goes home.  I will order this and prescribe it upon discharge  1501.  Her family is here and patient has remained stable for discharge.  Family knows the plan including need for follow-up with neurology and psychiatry    Pamella Ozell LABOR, DO 05/29/23 1127    Pamella Ozell LABOR, DO 05/29/23 1315    Pamella Ozell A, DO 05/29/23 1501

## 2023-05-29 NOTE — Consult Note (Addendum)
 [facility] Psychiatric Consult Follow-up  Patient Name: .Breanna Webster  MRN: [id number]  DOB: 10/12/1945  Consult Order details:  Orders (From admission, onward)     Start     Ordered   05/27/23 2319  CONSULT TO CALL ACT TEAM       Comments: Agitation, anxiety -Geri psych evaluation  Ordering Provider: Laurice Maude BROCKS, MD  Provider:  (Not yet assigned)  Question:  Reason for Consult?  Answer:  Psych consult   05/27/23 2319             Mode of Visit: In person    Psychiatry Consult Evaluation  Service Date: May 29, 2023 LOS:  LOS: 0 days  Chief Complaint: panic attacks at night   Primary Psychiatric Diagnoses  Unspecified neurocognitive deficits 2.  Unspecified anxiety 3. R/O Residual delirium  Assessment   Breanna Webster is a 78 y.o. Caucasian female with a past psychiatric history of unspecified anxiety and MDD, with pertinent medical comorbidities/history that include atrial fibrillation with RVR, prior strep bacteremia, CAD with stent to RCA in 2019, carotid artery stenosis with history of acute cerebellar stroke in 2020 and an embolectomy right posterior cerebral artery stroke in 2022, HFpEF, diverticulosis with prior diverticulitis, GERD, Graves' disease with posttreatment hypothyroidism, total shoulder replacement, dyslipidemia, hypertension, parkinsonian symptoms, type 2 diabetes, and recent E. coli UTI with polynephritis and hospital delirium, who presented this encounter by way of the patient's husband due to concerns for inability to breathe secondary to panic attacks. Patient has presented multiple times in the last several days for similar complaints and presentation.  Upon reevaluation today, patient continues to present with symptomology that is most indicative at this time of an unspecified neurocognitive disorder and unspecified anxiety, with the possibility that the patient is experiencing residual delirium from recent hospitalization for E. coli UTI  and polynephritis. Clinical suspicion for residual delirium however is low, given during evaluation, and collateral obtained from family, evidence is limited, and more in line with the aforementioned.  Patient during overnight observation with medication trial of Seroquel  reported to have appreciably done well, no panic attacks and/or increased anxiety state reported by staff and/or upon chart review. During evaluation additionally today, patient reports that she does not remember being anxious over the night, denies current anxiety and panic, as well as endorses desire to discharge with close outpatient follow-ups.  While collateral information from family, as well as evaluation conducted today, do find neurocognitive deficits, the patient does not present as an imminent risk to her self or others with endorsements of suicidal and/or homicidal ideations, appear decompensated into psychosis of concern for safety, and/or with lacking of familial support for discharge back to home, thus the recommendation is for psychiatric clearance, to follow-up with outpatient offices, additional care measures to be implemented (delirium precautions), and safety plan to be strictly adhered to and implemented by family.  Spoke extensively with the patient's husband and daughter-in-law who verbalized they are amenable to helping the patient follow-up with outpatient offices recommended, strictly adhering to safety plan, and implementing additional care measures discussed.  Spoke with Dr. Zouv who agrees with recommendations listed below.  Diagnoses:  Active Hospital problems: Principal Problem:   Neurocognitive deficits Active Problems:   Anxiety disorder    Plan   ## Psychiatric Recommendations:   #Unspecified neurocognitive impairment # Unspecified anxiety #R/O residual delirium  -Continue Seroquel  25 mg p.o. nightly -Recommend close outpatient follow-up with neurology -Recommend close outpatient  follow-up with psychiatry -Recommend  close outpatient follow-up with PCP -Recommend strict adherence to safety plan listed below and delirium precautions  Safety Plan Breanna Webster will reach out to husband, call 911 or call mobile crisis, or go to nearest emergency room if condition worsens or if suicidal thoughts become active Patients' will follow up with [facility] for outpatient psychiatric services (therapy/medication management).  The suicide prevention education provided includes the following: Suicide risk factors Suicide prevention and interventions National Suicide Hotline telephone number [facility] assessment telephone number [facility] Emergency Assistance 911 [facility] and/or Residential Mobile Crisis Unit telephone number Request made of family/significant other to: Husband Remove weapons (e.g., guns, rifles, knives), all items previously/currently identified as safety concern.   Remove drugs/medications (over the counter, prescriptions, illicit drugs), all items previously/currently identified as a safety concern.    ## Medical Decision Making Capacity: Not specifically addressed in this encounter  ## Further Work-up: None at this time  ## Disposition:-- There are no psychiatric contraindications to discharge at this time  ## Behavioral / Environmental:  -Delirium Precautions: Delirium Interventions for Nursing, Staff, and family: - RN to open blinds every AM. - To Bedside: Glasses, hearing aide, and pt's own shoes. Make available to patients. when possible and encourage use. - Encourage po fluids when appropriate, keep fluids within reach. - OOB to chair with meals. - Passive ROM exercises to all extremities with AM & PM care. - RN to assess orientation to person, time and place QAM and PRN. - Recommend extended visitation hours with familiar family/friends as feasible. - Staff to minimize disturbances at night. Turn off television when pt asleep or  when not in use.    ## Safety and Observation Level:  - Based on my clinical evaluation, I estimate the patient to be at low risk of self harm in the current setting and upon recommendation for discharge. - At this time, we recommend  1:1 Observation until discharge. This decision is based on my review of the chart including patient's history and current presentation, interview of the patient, mental status examination, and consideration of suicide risk including evaluating suicidal ideation, plan, intent, suicidal or self-harm behaviors, risk factors, and protective factors. This judgment is based on our ability to directly address suicide risk, implement suicide prevention strategies, and develop a safety plan while the patient is in the clinical setting. Please contact our team if there is a concern that risk level has changed.  CSSR Risk Category:C-SSRS RISK CATEGORY: No Risk  Suicide Risk Assessment: Patient has following modifiable risk factors for suicide: Unspecified anxiety, which we are addressing by recommendations. Patient has following non-modifiable or demographic risk factors for suicide: None Patient has the following protective factors against suicide: Access to outpatient mental health care, Supportive family, Supportive friends, Cultural, spiritual, or religious beliefs that discourage suicide, Frustration tolerance, no history of suicide attempts, and no history of NSSIB  Thank you for this consult request. Recommendations have been communicated to the primary team.  We will psychiatrically clear at this time.   Jerel JINNY Gravely, NP       History of Present Illness   Breanna Webster is a 78 y.o. Caucasian female with a past psychiatric history of unspecified anxiety and MDD, with pertinent medical comorbidities/history that include atrial fibrillation with RVR, prior strep bacteremia, CAD with stent to RCA in 2019, carotid artery stenosis with history of acute cerebellar  stroke in 2020 and an embolectomy right posterior cerebral artery stroke in 2022, HFpEF, diverticulosis with  prior diverticulitis, GERD, Graves' disease with posttreatment hypothyroidism, total shoulder replacement, dyslipidemia, hypertension, parkinsonian symptoms, type 2 diabetes, and recent E. coli UTI with polynephritis and hospital delirium, who presented this encounter by way of the patient's husband due to concerns for inability to breathe secondary to panic attacks. Patient has presented multiple times in the last several days for similar complaints and presentation.   Patient seen today at the [facility] emergency department for face-to-face psychiatric reevaluation.  Upon reevaluation, patient endorses that she is not sure how she slept over the night, states, I have memory problems so we will have to honestly ask the staff, I do not think I was anxious, but I am a little bit tired still this morning.  Patient endorses appetite this morning is fair, appreciably eating food while talking to this clinical research associate. Patient endorses that she believes that she is tolerating the Seroquel  given last night, denies any EPS symptomology and/or orthostasis upon getting up to the bathroom earlier with nursing.    Patient orientation evaluated, no appreciable concerns for fluctuations of consciousness, but continued to present with deficits in orientation (I.e., knew person, aspects of situation (knew she is here for anxiety), aspects of location (knew she is in a facility of some sort, aspects of time (knew month, year, not time of day, not day).  Patient endorsed she continues to not have suicidal and or homicidal ideations, auditory and or visual hallucinations, and objectively, did not appear to be presenting with psychotic features.  Patient endorsed no current anxiety and/or panic state during evaluation. Patient endorsed that she had a desire to discharge to home, states that family are in town from [place]   and she wants to see them.  Discussed with the patient if she had any safety concerns for returning back to home, to which she endorsed that she did not, spent some time with this provider discussing the addition of Seroquel  at length, then stated she felt confident she could be successful with recommendations given today.  Collateral, patient's husband Mr. Nusser and patient's daughter-in-law Ms. Lauren, spoken to at [phone]  Call placed and extensive conversation held with the patient's husband and the patient's daughter-in-law Ms. Lauren.  From conversation, both endorse that the patient has no history of dementia, and largely does not have any meaningful psychiatric history, outside of the last approximately 6 weeks, states that anxiety and some mild depression are both recent.   Patient's husband and daughter in law Ms. Lauren do endorse however, after extensive conversation, that they do believe that the patient has been progressively becoming more forgetful over the past year, in addition to has been appreciably using the wrong words and sentences.  Discussed with the patient's husband and daughter-in-law Ms. Lauren that the patient did well over the night with the trial addition of Seroquel , informed them that the patient did not have any reported panic attacks, trouble breathing, and/or feelings of impending doom or agitation; additionally informed family that the patient also suggested she is doing better today, though cognitive deficits are still appreciable.  Discussed with the patient's husband and daughter-in-law Ms. Lauren that from a psychiatric standpoint, the patient does not present as an imminent risk for self or others, and that because the patient has reportedly done meaningfully better with the addition of Seroquel  over the night, the recommendation would be for psychiatric clearance, for the patient to follow-up closely with outpatient neurology, psychiatry, and primary  care, and strictly adhere to safety plan put into  place, and additional care measures.  After conversation, no questions or concerns by family reported, states that they do not want the patient to go into skilled nursing facility, and that they will pick patient up for discharge.  Review of Systems  Constitutional:  Positive for malaise/fatigue.  Musculoskeletal:  Positive for back pain, joint pain and myalgias.  Psychiatric/Behavioral:  Positive for memory loss. Negative for depression, hallucinations, substance abuse and suicidal ideas. The patient has insomnia. The patient is not nervous/anxious.   All other systems reviewed and are negative.    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 rate (!) 22, height 5' 5 (1.651 m), weight 77.2 kg, SpO2 98%. Body mass index is 28.32 kg/m.  Physical Exam Vitals reviewed.  Constitutional:      General: She is not in acute distress.    Appearance: She is obese. She is not ill-appearing, toxic-appearing or diaphoretic.  Pulmonary:     Effort: Pulmonary effort is normal.  Skin:    General: Skin is warm and dry.  Neurological:      Mental Status: She is alert. She is disoriented.     Motor: No tremor or seizure activity.     Comments: Oriented to person, aspects of situation (knows she is here she believes for anxiety), aspects of location (knows she is in a facility of some sort, aspects of time (knew month, year, no time of day, not day)  Psychiatric:        Attention and Perception: She is inattentive (Mild). She does not perceive auditory or visual hallucinations.        Mood and Affect: Mood normal. Affect is flat.        Speech: Speech is delayed.        Behavior: Behavior is slowed. Behavior is cooperative.        Thought Content: Thought content normal. Thought content is not paranoid or delusional. Thought content does not include homicidal or suicidal ideation.        Cognition and Memory: Cognition is impaired. Memory is impaired.        Judgment: Judgment normal.     Mental Status Exam: General Appearance:  In scrubs, fairly groomed  Orientation:  Other:  Partial  Memory:  Immediate;   Poor Recent;   Poor Remote;   Poor  Concentration:  Concentration: Mildly reduced and Attention Span: Mildly reduced  Recall:  Poor  Attention  Other: Mildly reduced  Eye Contact:  Minimal  Speech:  Clear and Coherent and intermittently delayed  Language:  Fair  Volume:  Normal  Mood: Euthymic  Affect:   Mildly constricted  Thought Process:  Coherent and Goal Directed  Thought Content:  Logical  Suicidal Thoughts:  No  Homicidal Thoughts:  No  Judgement:  Intact  Insight:  Lacking, Present, and Shallow  Psychomotor Activity:  Normal  Akathisia:  No  Fund of Knowledge:  Fair      Assets:  Manufacturing Systems Engineer Desire for Improvement Financial Resources/Insurance Housing Intimacy Resilience Social Support Curator Vocational/Educational  Cognition:  Impaired,  Mild  ADL's:  Impaired  AIMS (if indicated):   0     Other History   These have been pulled in through the EMR,  reviewed, and updated if appropriate.  Family History:  The patient's family history includes Headache in an other family member; Healthy in her son; Heart failure in her mother. She was adopted.  Medical History: Past Medical History:  Diagnosis Date   Anxiety    Arthritis    back (04/22/2018)   Atrial fibrillation with RVR (HCC) 10/21/2018   Back pain    Bacteremia due to Streptococcus Salivarius 10/07/2018   Blood transfusion without reported diagnosis    CAD (coronary artery disease)    a. 03/2018 s/p PCI/DES to the RCA (3.0x15 Onyx DES).   Carotid artery stenosis    Mild   Cerebellar stroke, acute (HCC) 10/21/2018   Cerebrovascular accident (CVA) due to embolism of right posterior cerebral artery (HCC) 08/03/2020   Chest pain    CHF (congestive heart failure) (HCC)    Chronic lower back pain    Cirrhosis (HCC)    Colon polyps    Diverticulitis    Diverticulosis    Esophageal thickening    seen on pre TAVR CT scan, also questionable cirrhosis. MRI recommended. Will refer to GI   GERD (gastroesophageal reflux disease)    Gout    Grave's disease    H/O total shoulder replacement, left 06/01/2022   Heart murmur    History of cardioembolic stroke 03/29/2020   History of colonic polyps 05/22/2017   History of hiatal hernia    Hyperlipidemia    Hypertension    Hypothyroidism    IBS (irritable bowel syndrome)    Osteopenia    Parkinson's syndrome (HCC) 04/23/2020   Pulmonary nodules    seen on pre TAVR CT. likley benign. no follow up recommended if pt low risk.   S/P TAVR (transcatheter aortic valve replacement)    Shortness of breath on exertion    Stroke (HCC) 2021   x2   Thalassemia minor    Thyroid  disease    Type II diabetes mellitus (HCC)     Surgical History: Past Surgical History:  Procedure Laterality Date   61 HOUR PH STUDY N/A 03/03/2018   Procedure: 24 HOUR PH STUDY;  Surgeon: Shila Gustav GAILS, MD;  Location: [facility] ENDOSCOPY;  Service: Endoscopy;   Laterality: N/A;   COLONOSCOPY  2023   COLONOSCOPY W/ BIOPSIES AND POLYPECTOMY     CORONARY ANGIOGRAPHY Right 04/21/2018   Procedure: CORONARY ANGIOGRAPHY (CATH LAB);  Surgeon: Claudene Victory ORN, MD;  Location[facility] INVASIVE CV LAB;  Service: Cardiovascular;  Laterality: Right;   CORONARY STENT INTERVENTION N/A 04/22/2018   Procedure: CORONARY STENT INTERVENTION;  Surgeon: Claudene Victory ORN, MD;  Location: [facility] INVASIVE CV LAB;  Service: Cardiovascular;  Laterality: N/A;   DILATION AND CURETTAGE OF UTERUS     ESOPHAGEAL MANOMETRY N/A 03/03/2018   Procedure: ESOPHAGEAL MANOMETRY (EM);  Surgeon: Shila Gustav GAILS, MD;  Location: [facility] ENDOSCOPY;  Service: Endoscopy;  Laterality: N/A;   HYSTEROSCOPY     fibroids   LAPAROSCOPIC CHOLECYSTECTOMY  1985   LAPAROSCOPY  fibroids   NISSEN FUNDOPLICATION  1990s   POLYPECTOMY     REVERSE SHOULDER ARTHROPLASTY Left 06/01/2022   Procedure: REVERSE SHOULDER ARTHROPLASTY;  Surgeon: Kay Kemps, MD;  Location: [facility] ORS;  Service: Orthopedics;  Laterality: Left;  choice with interscalene block   RIGHT/LEFT HEART CATH AND CORONARY ANGIOGRAPHY N/A 02/20/2018   Procedure: RIGHT/LEFT HEART CATH AND CORONARY ANGIOGRAPHY;  Surgeon: Claudene Victory ORN, MD;  Location: [facility] INVASIVE CV LAB;  Service: Cardiovascular;  Laterality: N/A;   TEE WITHOUT CARDIOVERSION N/A 07/08/2018   Procedure: TRANSESOPHAGEAL ECHOCARDIOGRAM (TEE);  Surgeon: Verlin Lonni BIRCH, MD;  Location: [facility] OR;  Service: Open Heart Surgery;  Laterality: N/A;   TEE WITHOUT CARDIOVERSION  10/07/2018   TEE WITHOUT CARDIOVERSION N/A 10/07/2018   Procedure: TRANSESOPHAGEAL ECHOCARDIOGRAM (TEE);  Surgeon: Jeffrie Oneil BROCKS, MD;  Location[facility] ENDOSCOPY;  Service: Cardiovascular;  Laterality: N/A;   TONSILLECTOMY     age 47   TRANSCATHETER AORTIC VALVE REPLACEMENT, TRANSFEMORAL N/A 07/08/2018   Procedure: TRANSCATHETER AORTIC VALVE REPLACEMENT, TRANSFEMORAL;  Surgeon: Verlin Lonni BIRCH, MD;  Location: [facility] OR;   Service: Open Heart Surgery;  Laterality: N/A;     Medications:   Current Facility-Administered Medications:    acetaminophen  (TYLENOL ) tablet 500 mg, 500 mg, Oral, QID, Raford Lenis, MD   allopurinol  (ZYLOPRIM ) tablet 100 mg, 100 mg, Oral, Daily, Messick, Maude BROCKS, MD, 100 mg at 05/28/23 1437   amitriptyline  (ELAVIL ) tablet 25 mg, 25 mg, Oral, QHS PRN, Messick, Peter C, MD, 25 mg at 05/29/23 0222   apixaban  (ELIQUIS ) tablet 5 mg, 5 mg, Oral, BID, Messick, Peter C, MD, 5 mg at 05/28/23 2131   bisacodyl  (DULCOLAX) EC tablet 5 mg, 5 mg, Oral, Daily PRN, Laurice Maude BROCKS, MD   diazepam  (VALIUM ) tablet 2 mg, 2 mg, Oral, Daily PRN, Laurice Maude BROCKS, MD, 2 mg at 05/28/23 2156   diltiazem  (CARDIZEM  CD) 24 hr capsule 120 mg, 120 mg, Oral, Daily, Laurice Maude BROCKS, MD, 120 mg at 05/28/23 1437   haloperidol  lactate (HALDOL ) injection 2.5 mg, 2.5 mg, Intravenous, Once, Rancour, Stephen, MD   insulin  aspart (novoLOG ) injection 5 Units, 5 Units, Subcutaneous, TID WC, Laurice Maude BROCKS, MD, 5 Units at 05/28/23 1932   insulin  NPH Human (NOVOLIN  N) injection 10 Units, 10 Units, Subcutaneous, QAC breakfast, Messick, Maude BROCKS, MD   insulin  NPH Human (NOVOLIN  N) injection 7 Units, 7 Units, Subcutaneous, QHS, Laurice Maude BROCKS, MD, 7 Units at 05/28/23 2131   levothyroxine  (SYNTHROID ) tablet 137 mcg, 137 mcg, Oral, q morning, Laurice Maude BROCKS, MD, 137 mcg at 05/29/23 9344   metFORMIN  (GLUCOPHAGE -XR) 24 hr tablet 500 mg, 500 mg, Oral, BID, Messick, Peter C, MD, 500 mg at 05/28/23 2131   mirabegron  ER (MYRBETRIQ ) tablet 25 mg, 25 mg, Oral, Daily, Raford Lenis, MD   multivitamin with minerals tablet 1 tablet, 1 tablet, Oral, Daily, Laurice Maude BROCKS, MD, 1 tablet at 05/28/23 1431   nitroGLYCERIN  (NITROSTAT ) SL tablet 0.4 mg, 0.4 mg, Sublingual, Q5 min PRN, Raford Lenis, MD   ondansetron  (ZOFRAN ) tablet 4 mg, 4 mg, Oral, Q8H PRN, Laurice Maude BROCKS, MD   oxyCODONE  (Oxy IR/ROXICODONE ) immediate release tablet 5 mg, 5 mg,  Oral, Q4H PRN, Raford Lenis, MD, 5 mg at 05/29/23 0400   pantoprazole  (PROTONIX ) EC tablet 40 mg, 40 mg, Oral, Daily, Laurice Maude BROCKS, MD, 40 mg at 05/28/23 1433   polyethylene glycol (MIRALAX  / GLYCOLAX ) packet 17 g, 17 g, Oral, Daily PRN, Laurice Maude BROCKS, MD   potassium chloride   SA (KLOR-CON  M) CR tablet 20 mEq, 20 mEq, Oral, Daily, Messick, Peter C, MD, 20 mEq at 05/28/23 1433   QUEtiapine  (SEROQUEL ) tablet 25 mg, 25 mg, Oral, QHS, Coleman, Nash, NP, 25 mg at 05/28/23 1932   spironolactone  (ALDACTONE ) tablet 12.5 mg, 12.5 mg, Oral, Daily, Messick, Peter C, MD, 12.5 mg at 05/28/23 1431   tiZANidine  (ZANAFLEX ) tablet 4 mg, 4 mg, Oral, Daily PRN, Raford Lenis, MD, 4 mg at 05/29/23 0401   torsemide  (DEMADEX ) tablet 40 mg, 40 mg, Oral, Daily, Laurice Maude BROCKS, MD, 40 mg at 05/28/23 1428  Current Outpatient Medications:    allopurinol  (ZYLOPRIM ) 100 MG tablet, Take 1 tablet (100 mg total) by mouth daily., Disp: 90 tablet, Rfl: 1   amitriptyline  (ELAVIL ) 25 MG tablet, Take 1 tablet (25 mg total) by mouth at bedtime. (Patient taking differently: Take 25 mg by mouth at bedtime as needed for sleep (Headaches).), Disp: 90 tablet, Rfl: 1   apixaban  (ELIQUIS ) 5 MG TABS tablet, Take 1 tablet (5 mg total) by mouth 2 (two) times daily., Disp: 60 tablet, Rfl: 3   diltiazem  (CARTIA  XT) 120 MG 24 hr capsule, Take 1 capsule (120 mg total) by mouth daily., Disp: 90 capsule, Rfl: 3   hydrOXYzine  (VISTARIL ) 25 MG capsule, Take 1 capsule (25 mg total) by mouth every 8 (eight) hours as needed., Disp: 90 capsule, Rfl: 1   Insulin  NPH, Human,, Isophane, (HUMULIN  N KWIKPEN) 100 UNIT/ML Kiwkpen, Inject 20 Units into the skin in the morning and at bedtime. Decrease your dose to 10 units every morning, and 7 units every evening still appetite back to normal (Patient taking differently: Inject 12-20 Units into the skin in the morning and at bedtime. 20 units in the morning and 12-14 units in the evening), Disp: , Rfl:     metFORMIN  (GLUCOPHAGE -XR) 500 MG 24 hr tablet, Take 1 tablet by mouth 2 times daily, Disp: 180 tablet, Rfl: 2   omeprazole  (PRILOSEC) 20 MG capsule, Take 1 capsule (20 mg total) by mouth daily., Disp: 90 capsule, Rfl: 3   ondansetron  (ZOFRAN ) 4 MG tablet, Take 1 tablet (4 mg total) by mouth every 8 (eight) hours as needed for nausea or vomiting., Disp: 40 tablet, Rfl: 1   spironolactone  (ALDACTONE ) 25 MG tablet, Take 0.5 tablets (12.5 mg total) by mouth daily., Disp: 15 tablet, Rfl: 0   SYNTHROID  137 MCG tablet, Take 1 tablet (137 mcg total) by mouth every morning on empty stomach., Disp: 90 tablet, Rfl: 1   tiZANidine  (ZANAFLEX ) 4 MG tablet, Take 1 tablet (4 mg total) by mouth 3 (three) times daily. (Patient taking differently: Take 4 mg by mouth daily as needed for muscle spasms.), Disp: 90 tablet, Rfl: 1   torsemide  (DEMADEX ) 20 MG tablet, Take 2 tablets (40 mg total) by mouth daily., Disp: 60 tablet, Rfl: 0   acetaminophen  (TYLENOL ) 500 MG tablet, Take 1 tablet (500 mg total) by mouth 4 (four) times daily., Disp: 30 tablet, Rfl: 0   b complex vitamins capsule, Take 1 capsule by mouth daily., Disp: , Rfl:    bisacodyl  (DULCOLAX) 5 MG EC tablet, Take 1 Tablet with each dose pf pain medicine., Disp: 100 tablet, Rfl: 1   Cholecalciferol  (D3 PO), Take 1 tablet by mouth daily., Disp: , Rfl:    colchicine  0.6 MG tablet, Take 1 tablet (0.6 mg total) by mouth daily. as needed for gout, Disp: 90 tablet, Rfl: 0   diazepam  (VALIUM ) 2 MG tablet, Take 1 tablet (2 mg  total) by mouth daily as needed for up to 10 doses for anxiety., Disp: 10 tablet, Rfl: 0   insulin  regular (HUMULIN  R) 100 units/mL injection, Please decrease your dose to 5 units 3 times daily before meals until appetite picks up back to normal, Disp: , Rfl:    Multiple Vitamin (MULITIVITAMIN WITH MINERALS) TABS, Take 1 tablet by mouth daily., Disp: , Rfl:    nitroGLYCERIN  (NITROSTAT ) 0.4 MG SL tablet, Place 1 tablet (0.4 mg total) under the tongue  every 5 (five) minutes as needed for chest pain., Disp: 25 tablet, Rfl: 4   oxyCODONE  (OXY IR/ROXICODONE ) 5 MG immediate release tablet, Take 5 mg by mouth every 6 (six) hours as needed for severe pain (pain score 7-10)., Disp: , Rfl:    polyethylene glycol (MIRALAX ) 17 g packet, Take 17 g by mouth daily as needed., Disp: , Rfl:    potassium chloride  SA (KLOR-CON  M) 20 MEQ tablet, Take 0.5 tablets (10 mEq total) by mouth daily. Take in concurrence with torsemide . (Patient taking differently: Take 20 mEq by mouth daily. Take in concurrence with torsemide .), Disp: , Rfl:    Vibegron  (GEMTESA ) 75 MG TABS, Take 75 mg by mouth at bedtime., Disp: , Rfl:    vitamin E  180 MG (400 UNITS) capsule, Take 400 Units by mouth daily., Disp: , Rfl:   Allergies: Allergies  Allergen Reactions   Statins Other (See Comments)    Tried simvastatin , atorvastatin ; on Repatha     Jerel JINNY Gravely, NP

## 2023-05-29 NOTE — Discharge Planning (Signed)
     Durable Medical Equipment  (From admission, onward)           Start     Ordered   05/29/23 1607  For home use only DME Bedside commode  Once       Question:  Patient needs a bedside commode to treat with the following condition  Answer:  Gait difficulty   05/29/23 1606   05/29/23 1606  For home use only DME Walker rolling  Once       Question Answer Comment  Walker: With 5 Inch Wheels   Patient needs a walker to treat with the following condition Gait difficulty      05/29/23 1606

## 2023-05-29 NOTE — Progress Notes (Addendum)
 12:05pm: Patient remains without any bed offers at this time.  Per Jerel, NP he spoke with patient's family who is agreeable to take her home this afternoon. NP sent 14 day supply of Seroquel  to [facility] pharmacy that will be delivered prior to discharge.  Patient is active with [facility] for home health - CSW notified Darleene at [facility] of information.  9:40am: CSW has not received any bed offers for SNF for patient.  CSW faxed patient's clinicals out to additional facilities in attempt to obtain a bed offer.  Niels Portugal, MSW, LCSW Transitions of Care  Clinical Social Worker II [phone]

## 2023-05-29 NOTE — Discharge Instructions (Addendum)
 Please perform close outpatient follow-up with neurology Please perform close outpatient follow-up with PCP Please perform close outpatient follow-up with psychiatry at the [facility] behavioral health outpatient office Please strictly adhere to safety plan listed below Please continue Seroquel  25 mg by mouth nightly Begin giving lactulose  as prescribed to help with mental status and constipation  Safety Plan Breanna Webster will reach out to husband, call 911 or call mobile crisis, or go to nearest emergency room if condition worsens or if suicidal thoughts become active Patients' will follow up with [facility] for outpatient psychiatric services (therapy/medication management).  The suicide prevention education provided includes the following: Suicide risk factors Suicide prevention and interventions National Suicide Hotline telephone number [facility] assessment telephone number [facility] Emergency Assistance 911 [facility] and/or Residential Mobile Crisis Unit telephone number Request made of family/significant other to: Husband Remove weapons (e.g., guns, rifles, knives), all items previously/currently identified as safety concern.   Remove drugs/medications (over the counter, prescriptions, illicit drugs), all items previously/currently identified as a safety concern.

## 2023-05-30 ENCOUNTER — Telehealth: Payer: Self-pay | Admitting: Adult Health

## 2023-05-30 ENCOUNTER — Telehealth: Payer: Self-pay

## 2023-05-30 NOTE — Telephone Encounter (Signed)
 Patient is needing a referral for psychiatrist to [facility] fax number  [phone]

## 2023-05-30 NOTE — Transitions of Care (Post Inpatient/ED Visit) (Signed)
   05/30/2023  Name: Breanna Webster MRN: [id number] DOB: 1946-02-22  Today's TOC FU Call Status: Today's TOC FU Call Status:: Unsuccessful Call (1st Attempt) Unsuccessful Call (1st Attempt) Date: 05/30/23  Attempted to reach the patient regarding the most recent Inpatient/ED visit.  Follow Up Plan: Additional outreach attempts will be made to reach the patient to complete the Transitions of Care (Post Inpatient/ED visit) call.   Signature Julian Lemmings, LPN [facility] Nurse Health Advisor Direct Dial [phone]

## 2023-05-31 ENCOUNTER — Telehealth: Payer: Self-pay

## 2023-05-31 ENCOUNTER — Telehealth: Payer: Self-pay | Admitting: Neurology

## 2023-05-31 DIAGNOSIS — A4151 Sepsis due to Escherichia coli [E. coli]: Secondary | ICD-10-CM | POA: Diagnosis not present

## 2023-05-31 DIAGNOSIS — I2510 Atherosclerotic heart disease of native coronary artery without angina pectoris: Secondary | ICD-10-CM | POA: Diagnosis not present

## 2023-05-31 DIAGNOSIS — N179 Acute kidney failure, unspecified: Secondary | ICD-10-CM | POA: Diagnosis not present

## 2023-05-31 DIAGNOSIS — I5032 Chronic diastolic (congestive) heart failure: Secondary | ICD-10-CM | POA: Diagnosis not present

## 2023-05-31 DIAGNOSIS — N16 Renal tubulo-interstitial disorders in diseases classified elsewhere: Secondary | ICD-10-CM | POA: Diagnosis not present

## 2023-05-31 DIAGNOSIS — Z79899 Other long term (current) drug therapy: Secondary | ICD-10-CM

## 2023-05-31 DIAGNOSIS — F419 Anxiety disorder, unspecified: Secondary | ICD-10-CM

## 2023-05-31 DIAGNOSIS — I110 Hypertensive heart disease with heart failure: Secondary | ICD-10-CM | POA: Diagnosis not present

## 2023-05-31 NOTE — Telephone Encounter (Signed)
**Note De-identified  [place] Obfuscation** Please advise 

## 2023-05-31 NOTE — Transitions of Care (Post Inpatient/ED Visit) (Signed)
   05/31/2023  Name: Breanna Webster MRN: [id number] DOB: May 20, 1945  Today's TOC FU Call Status: Today's TOC FU Call Status:: Unsuccessful Call (2nd Attempt) Unsuccessful Call (1st Attempt) Date: 05/30/23 Unsuccessful Call (2nd Attempt) Date: 05/31/23  Attempted to reach the patient regarding the most recent Inpatient/ED visit.  Follow Up Plan: Additional outreach attempts will be made to reach the patient to complete the Transitions of Care (Post Inpatient/ED visit) call.   Signature Julian Lemmings, LPN [facility] Nurse Health Advisor Direct Dial [phone]

## 2023-05-31 NOTE — Telephone Encounter (Signed)
 Signe Eastern, PT with [facility] seeking verbal orders for Home health nursing and social worker. Signe has been unable to reach out to reach pt's PCP, he said he will reach out to other doctors. Signe is requesting an evaluation, his vm is secure at [phone]

## 2023-05-31 NOTE — Telephone Encounter (Signed)
 Called Jim back no answer.

## 2023-05-31 NOTE — Telephone Encounter (Signed)
 Josiah Nigh called from [facility] asking for permission for pt and social worker I said proceed with that

## 2023-05-31 NOTE — Telephone Encounter (Signed)
 Copied from CRM [id number]. Topic: Clinical - Home Health Verbal Orders >> May 31, 2023  9:33 AM Isabell A wrote: Caller/Agency: Signe Eastern from [name]  Callback Number: [phone] Service Requested: Physical Therapy Frequency: Social worker & Nursing evaluation  Any new concerns about the patient? Yes, Patient was in the ER this week, back home now. Patient continues to struggle with constipation, poor appetite, today 8/10 pain in her hips, losing weight - does have a number of specialist appointments upcoming to address these issues.   Signe is requesting a callback, states his voicemail is confidential it is ok to leave voicemail.

## 2023-05-31 NOTE — Telephone Encounter (Signed)
 Copied from CRM [id number]. Topic: Referral - Status >> May 30, 2023  4:58 PM Mercedes MATSU wrote: Reason for CRM: Patient was discharged from the hospital, patient is stating that she needs a referral for psych, medication management and to treat anxiety. Patients daughter in law said that she called the office prior and was told they would fax over the referral to [phone] [facility] (apart of [place]).

## 2023-05-31 NOTE — Telephone Encounter (Signed)
 Lmtrc 1st attempt

## 2023-05-31 NOTE — Addendum Note (Signed)
 Addended by: Twyla Galeazzi R on: 05/31/2023 05:18 PM   Modules accepted: Orders

## 2023-06-03 NOTE — Transitions of Care (Post Inpatient/ED Visit) (Signed)
   06/03/2023  Name: Breanna Webster MRN: [id number] DOB: [birth year]  Today's TOC FU Call Status: Today's TOC FU Call Status:: Unsuccessful Call (3rd Attempt) Unsuccessful Call (1st Attempt) Date: 05/30/23 Unsuccessful Call (2nd Attempt) Date: 05/31/23 Unsuccessful Call (3rd Attempt) Date: 06/03/23  Attempted to reach the patient regarding the most recent Inpatient/ED visit.  Follow Up Plan: No further outreach attempts will be made at this time. We have been unable to contact the patient.  Signature Darrall Ellison, LPN [facility] Nurse Health Advisor Direct Dial [phone]

## 2023-06-04 ENCOUNTER — Ambulatory Visit (INDEPENDENT_AMBULATORY_CARE_PROVIDER_SITE_OTHER): Payer: Medicare Other | Admitting: Adult Health

## 2023-06-04 ENCOUNTER — Other Ambulatory Visit (HOSPITAL_BASED_OUTPATIENT_CLINIC_OR_DEPARTMENT_OTHER): Payer: Self-pay

## 2023-06-04 VITALS — BP 100/60 | HR 86

## 2023-06-04 DIAGNOSIS — F419 Anxiety disorder, unspecified: Secondary | ICD-10-CM | POA: Diagnosis not present

## 2023-06-04 DIAGNOSIS — K5900 Constipation, unspecified: Secondary | ICD-10-CM

## 2023-06-04 MED ORDER — DIAZEPAM 2 MG PO TABS
2.0000 mg | ORAL_TABLET | Freq: Three times a day (TID) | ORAL | 0 refills | Status: DC | PRN
  Filled 2023-06-04: qty 20, 7d supply, fill #0

## 2023-06-04 NOTE — Addendum Note (Signed)
Addended by: Waymon Amato R on: 06/04/2023 07:48 AM   Modules accepted: Orders

## 2023-06-04 NOTE — Progress Notes (Signed)
Subjective:    Patient ID: Breanna Webster, female    DOB: 01/03/1946, 78 y.o.   MRN: [id number]  HPI 78 year old female who  has a past medical history of Anxiety, Arthritis, Atrial fibrillation with RVR (HCC) (10/21/2018), Back pain, Bacteremia due to Streptococcus Salivarius (10/07/2018), Blood transfusion without reported diagnosis, CAD (coronary artery disease), Carotid artery stenosis, Cerebellar stroke, acute (HCC) (10/21/2018), Cerebrovascular accident (CVA) due to embolism of right posterior cerebral artery (HCC) (08/03/2020), Chest pain, CHF (congestive heart failure) (HCC), Chronic lower back pain, Cirrhosis (HCC), Colon polyps, Diverticulitis, Diverticulosis, Esophageal thickening, GERD (gastroesophageal reflux disease), Gout, Grave's disease, H/O total shoulder replacement, left (06/01/2022), Heart murmur, History of cardioembolic stroke (40/98/1191), History of colonic polyps (05/22/2017), History of hiatal hernia, Hyperlipidemia, Hypertension, Hypothyroidism, IBS (irritable bowel syndrome), Osteopenia, Parkinson's syndrome (HCC) (04/23/2020), Pulmonary nodules, S/P TAVR (transcatheter aortic valve replacement), Shortness of breath on exertion, Stroke (HCC) (2021), Thalassemia minor, Thyroid disease, and Type II diabetes mellitus (HCC).   She was seen at [facility] emergency room 8 days ago with a complaint of worsening anxiety.  Her spouse reported that the patient was having nighttime panic attacks over the last 6 months.  Felt as though the symptoms were worsening.  She was prescribed Atarax by this writer but obviously does not improve her symptoms.  At [facility] emergency room she was given Valium and was feeling better overall.  Later on that day she ended up going to the emergency room at [facility] for continued anxiety.  At [facility] she was seen by the psychiatric team who placed her on Seroquel 25 mg p.o. nightly and commended follow-up with psychiatry and  neurology.  Today she is with her husband.  They report that she has an appointment with psychiatry in 2 days.  She has been taking her Seroquel 25 mg at night.  She is using Valium as needed but is out of her prescription.  She continues to have anxiety and panic attacks when she does not have Valium.  She also reports continued issues with constipation, she was prescribed lactulose while in the emergency room, has been taking this daily but has not had a bowel movement in close to a week.  Review of Systems See HPI   Past Medical History:  Diagnosis Date   Anxiety    Arthritis    "back" (04/22/2018)   Atrial fibrillation with RVR (HCC) 10/21/2018   Back pain    Bacteremia due to Streptococcus Salivarius 10/07/2018   Blood transfusion without reported diagnosis    CAD (coronary artery disease)    a. 03/2018 s/p PCI/DES to the RCA (3.0x15 Onyx DES).   Carotid artery stenosis    Mild   Cerebellar stroke, acute (HCC) 10/21/2018   Cerebrovascular accident (CVA) due to embolism of right posterior cerebral artery (HCC) 08/03/2020   Chest pain    CHF (congestive heart failure) (HCC)    Chronic lower back pain    Cirrhosis (HCC)    Colon polyps    Diverticulitis    Diverticulosis    Esophageal thickening    seen on pre TAVR CT scan, also questionable cirrhosis. MRI recommended. Will refer to GI   GERD (gastroesophageal reflux disease)    Gout    Grave's disease    H/O total shoulder replacement, left 06/01/2022   Heart murmur    History of cardioembolic stroke 03/29/2020   History of colonic polyps 05/22/2017   History of hiatal hernia    Hyperlipidemia  Hypertension    Hypothyroidism    IBS (irritable bowel syndrome)    Osteopenia    Parkinson's syndrome (HCC) 04/23/2020   Pulmonary nodules    seen on pre TAVR CT. likley benign. no follow up recommended if pt low risk.   S/P TAVR (transcatheter aortic valve replacement)    Shortness of breath on exertion    Stroke (HCC)  2021   x2   Thalassemia minor    Thyroid disease    Type II diabetes mellitus (HCC)     Social History   Socioeconomic History   Marital status: Married    Spouse name: Not on file   Number of children: 2   Years of education: Not on file   Highest education level: Associate degree: academic program  Occupational History   Occupation: Health and safety inspector of the [employer]  Tobacco Use   Smoking status: Never    Passive exposure: Never   Smokeless tobacco: Never  Vaping Use   Vaping status: Never Used  Substance and Sexual Activity   Alcohol use: No    Alcohol/week: 0.0 standard drinks of alcohol   Drug use: Never   Sexual activity: Not Currently  Other Topics Concern   Not on file  Social History Narrative   Lives with husband in a one story home.     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   Education: college.   Social Drivers of Health   Financial Resource Strain: Patient Declined (12/09/2022)   Overall Financial Resource Strain (CARDIA)    Difficulty of Paying Living Expenses: Patient declined  Food Insecurity: No Food Insecurity (05/24/2023)   Hunger Vital Sign    Worried About Running Out of Food in the Last Year: Never true    Ran Out of Food in the Last Year: Never true  Transportation Needs: No Transportation Needs (05/24/2023)   PRAPARE - Administrator, Civil Service (Medical): No    Lack of Transportation (Non-Medical): No  Physical Activity: Unknown (12/09/2022)   Exercise Vital Sign    Days of Exercise per Week: Patient declined    Minutes of Exercise per Session: 40 min  Stress: No Stress Concern Present (05/02/2023)   Received from [facility] of Occupational Health - Occupational Stress Questionnaire    Feeling of Stress : Not at all  Social Connections: Unknown (12/09/2022)   Social Connection and Isolation Panel [NHANES]    Frequency of  Communication with Friends and Family: More than three times a week    Frequency of Social Gatherings with Friends and Family: Patient declined    Attends Religious Services: Patient declined    Active Member of Clubs or Organizations: Patient declined    Attends Engineer, structural: More than 4 times per year    Marital Status: Married  Catering manager Violence: Not At Risk (05/24/2023)   Humiliation, Afraid, Rape, and Kick questionnaire    Fear of Current or Ex-Partner: No    Emotionally Abused: No    Physically Abused: No    Sexually Abused: No    Past Surgical History:  Procedure Laterality Date   31 HOUR PH STUDY N/A 03/03/2018   Procedure: 24 HOUR PH STUDY;  Surgeon: Napoleon Form, MD;  Location: [facility] ENDOSCOPY;  Service: Endoscopy;  Laterality: N/A;   COLONOSCOPY  2023   COLONOSCOPY W/ BIOPSIES AND POLYPECTOMY     CORONARY ANGIOGRAPHY  Right 04/21/2018   Procedure: CORONARY ANGIOGRAPHY (CATH LAB);  Surgeon: Lyn Records, MD;  Location: [facility] INVASIVE CV LAB;  Service: Cardiovascular;  Laterality: Right;   CORONARY STENT INTERVENTION N/A 04/22/2018   Procedure: CORONARY STENT INTERVENTION;  Surgeon: Lyn Records, MD;  Location: [facility] INVASIVE CV LAB;  Service: Cardiovascular;  Laterality: N/A;   DILATION AND CURETTAGE OF UTERUS     ESOPHAGEAL MANOMETRY N/A 03/03/2018   Procedure: ESOPHAGEAL MANOMETRY (EM);  Surgeon: Napoleon Form, MD;  Location: [facility] ENDOSCOPY;  Service: Endoscopy;  Laterality: N/A;   HYSTEROSCOPY     fibroids   LAPAROSCOPIC CHOLECYSTECTOMY  1985   LAPAROSCOPY     fibroids   NISSEN FUNDOPLICATION  1990s   POLYPECTOMY     REVERSE SHOULDER ARTHROPLASTY Left 06/01/2022   Procedure: REVERSE SHOULDER ARTHROPLASTY;  Surgeon: Beverely Low, MD;  Location: [facility] ORS;  Service: Orthopedics;  Laterality: Left;  choice with interscalene block   RIGHT/LEFT HEART CATH AND CORONARY ANGIOGRAPHY N/A 02/20/2018   Procedure: RIGHT/LEFT HEART CATH AND CORONARY  ANGIOGRAPHY;  Surgeon: Lyn Records, MD;  Location: [facility] INVASIVE CV LAB;  Service: Cardiovascular;  Laterality: N/A;   TEE WITHOUT CARDIOVERSION N/A 07/08/2018   Procedure: TRANSESOPHAGEAL ECHOCARDIOGRAM (TEE);  Surgeon: Kathleene Hazel, MD;  Location: [facility] OR;  Service: Open Heart Surgery;  Laterality: N/A;   TEE WITHOUT CARDIOVERSION  10/07/2018   TEE WITHOUT CARDIOVERSION N/A 10/07/2018   Procedure: TRANSESOPHAGEAL ECHOCARDIOGRAM (TEE);  Surgeon: Jake Bathe, MD;  Location: [facility] ENDOSCOPY;  Service: Cardiovascular;  Laterality: N/A;   TONSILLECTOMY     age 62   TRANSCATHETER AORTIC VALVE REPLACEMENT, TRANSFEMORAL N/A 07/08/2018   Procedure: TRANSCATHETER AORTIC VALVE REPLACEMENT, TRANSFEMORAL;  Surgeon: Kathleene Hazel, MD;  Location: [facility] OR;  Service: Open Heart Surgery;  Laterality: N/A;    Family History  Adopted: Yes  Problem Relation Age of Onset   Healthy Son        x 2   Headache Other        Cluster headaches   Heart failure Mother    Colon cancer Neg Hx    Pancreatic cancer Neg Hx    Rectal cancer Neg Hx    Stomach cancer Neg Hx     Allergies  Allergen Reactions   Statins Other (See Comments)    Tried simvastatin, atorvastatin; on Repatha    Current Outpatient Medications on File Prior to Visit  Medication Sig Dispense Refill   acetaminophen (TYLENOL) 500 MG tablet Take 1 tablet (500 mg total) by mouth 4 (four) times daily. 30 tablet 0   allopurinol (ZYLOPRIM) 100 MG tablet Take 1 tablet (100 mg total) by mouth daily. 90 tablet 1   amitriptyline (ELAVIL) 25 MG tablet Take 1 tablet (25 mg total) by mouth at bedtime. (Patient taking differently: Take 25 mg by mouth at bedtime as needed for sleep (Headaches).) 90 tablet 1   apixaban (ELIQUIS) 5 MG TABS tablet Take 1 tablet (5 mg total) by mouth 2 (two) times daily. 60 tablet 3   b complex vitamins capsule Take 1 capsule by mouth daily.     bisacodyl (DULCOLAX) 5 MG EC tablet Take 1 Tablet with each dose  pf pain medicine. (Patient taking differently: Take 5 mg by mouth daily as needed for mild constipation or moderate constipation. Take 1 Tablet with each dose with pain medicine.) 100 tablet 1   Cholecalciferol (D3 PO) Take 1 tablet by mouth daily.     colchicine 0.6 MG tablet  Take 1 tablet (0.6 mg total) by mouth daily. as needed for gout (Patient taking differently: Take 0.6 mg by mouth daily as needed (gout).) 90 tablet 0   diltiazem (CARTIA XT) 120 MG 24 hr capsule Take 1 capsule (120 mg total) by mouth daily. 90 capsule 3   Insulin NPH, Human,, Isophane, (HUMULIN N KWIKPEN) 100 UNIT/ML Kiwkpen Inject 20 Units into the skin in the morning and at bedtime. Decrease your dose to 10 units every morning, and 7 units every evening still appetite back to normal (Patient taking differently: Inject 12-20 Units into the skin in the morning and at bedtime. 20 units in the morning and 12-14 units in the evening)     insulin regular (HUMULIN R) 100 units/mL injection Please decrease your dose to 5 units 3 times daily before meals until appetite picks up back to normal     Lactulose 20 GM/30ML SOLN Take 15 mLs (10 g total) by mouth 3 (three) times daily. 1350 mL 0   metFORMIN (GLUCOPHAGE-XR) 500 MG 24 hr tablet Take 1 tablet by mouth 2 times daily 180 tablet 2   Multiple Vitamin (MULITIVITAMIN WITH MINERALS) TABS Take 1 tablet by mouth daily.     nitroGLYCERIN (NITROSTAT) 0.4 MG SL tablet Place 1 tablet (0.4 mg total) under the tongue every 5 (five) minutes as needed for chest pain. 25 tablet 4   omeprazole (PRILOSEC) 20 MG capsule Take 1 capsule (20 mg total) by mouth daily. 90 capsule 3   ondansetron (ZOFRAN) 4 MG tablet Take 1 tablet (4 mg total) by mouth every 8 (eight) hours as needed for nausea or vomiting. 40 tablet 1   oxyCODONE (OXY IR/ROXICODONE) 5 MG immediate release tablet Take 5 mg by mouth every 6 (six) hours as needed for severe pain (pain score 7-10).     polyethylene glycol (MIRALAX) 17 g packet  Take 17 g by mouth daily as needed.     potassium chloride SA (KLOR-CON M) 20 MEQ tablet Take 0.5 tablets (10 mEq total) by mouth daily. Take in concurrence with torsemide. (Patient taking differently: Take 20 mEq by mouth daily. Take in concurrence with torsemide.)     QUEtiapine (SEROQUEL) 25 MG tablet Take 1 tablet (25 mg total) by mouth at bedtime. 14 tablet 0   spironolactone (ALDACTONE) 25 MG tablet Take 0.5 tablets (12.5 mg total) by mouth daily. 15 tablet 0   SYNTHROID 137 MCG tablet Take 1 tablet (137 mcg total) by mouth every morning on empty stomach. 90 tablet 1   tiZANidine (ZANAFLEX) 4 MG tablet Take 1 tablet (4 mg total) by mouth 3 (three) times daily. (Patient taking differently: Take 4 mg by mouth daily as needed for muscle spasms.) 90 tablet 1   torsemide (DEMADEX) 20 MG tablet Take 2 tablets (40 mg total) by mouth daily. 60 tablet 0   Vibegron (GEMTESA) 75 MG TABS Take 75 mg by mouth at bedtime.     vitamin E 180 MG (400 UNITS) capsule Take 400 Units by mouth daily.     No current facility-administered medications on file prior to visit.    BP 100/60   Pulse 86   SpO2 100%       Objective:   Physical Exam Vitals and nursing note reviewed.  Constitutional:      Appearance: Normal appearance.  Cardiovascular:     Rate and Rhythm: Normal rate and regular rhythm.     Pulses: Normal pulses.     Heart sounds: Normal heart sounds.  Pulmonary:     Effort: Pulmonary effort is normal.     Breath sounds: Normal breath sounds.  Skin:    General: Skin is warm and dry.  Neurological:     Mental Status: She is alert. Mental status is at baseline.  Psychiatric:        Mood and Affect: Mood is anxious. Affect is angry.        Behavior: Behavior is agitated.        Thought Content: Thought content is paranoid.        Assessment & Plan:  1. Anxiety disorder, unspecified type (Primary) -As the visit went on today she became more more anxious to the point of almost having a  panic attack.  She seemed paranoid today, accusing her husband of wanting to put her in a nursing facility, not loving her anymore and constantly being mad at her; all of which she adamantly denied.  I am glad she has an appointment with psychiatry later on this week.  In the meantime I will send in a new prescription for Valium  to get her through until she is seen and keep her on Seroquel 25 mg p.o. - diazepam (VALIUM) 2 MG tablet; Take 1 tablet (2 mg total) by mouth every 8 (eight) hours as needed for up to 20 doses for anxiety.  Dispense: 20 tablet; Refill: 0  2. Constipation, unspecified constipation type - Will have her increase Lactulose to BID dosing   Time spent with patient today was 32 minutes which consisted of chart review, discussing anxiety and constipation, work up, treatment answering questions and documentation.

## 2023-06-05 ENCOUNTER — Other Ambulatory Visit (HOSPITAL_BASED_OUTPATIENT_CLINIC_OR_DEPARTMENT_OTHER): Payer: Self-pay

## 2023-06-05 DIAGNOSIS — I2510 Atherosclerotic heart disease of native coronary artery without angina pectoris: Secondary | ICD-10-CM | POA: Diagnosis not present

## 2023-06-05 DIAGNOSIS — M533 Sacrococcygeal disorders, not elsewhere classified: Secondary | ICD-10-CM | POA: Diagnosis not present

## 2023-06-05 DIAGNOSIS — I5032 Chronic diastolic (congestive) heart failure: Secondary | ICD-10-CM | POA: Diagnosis not present

## 2023-06-05 DIAGNOSIS — N179 Acute kidney failure, unspecified: Secondary | ICD-10-CM | POA: Diagnosis not present

## 2023-06-05 DIAGNOSIS — N16 Renal tubulo-interstitial disorders in diseases classified elsewhere: Secondary | ICD-10-CM | POA: Diagnosis not present

## 2023-06-05 DIAGNOSIS — A4151 Sepsis due to Escherichia coli [E. coli]: Secondary | ICD-10-CM | POA: Diagnosis not present

## 2023-06-05 DIAGNOSIS — I110 Hypertensive heart disease with heart failure: Secondary | ICD-10-CM | POA: Diagnosis not present

## 2023-06-05 NOTE — Telephone Encounter (Signed)
Done

## 2023-06-06 ENCOUNTER — Other Ambulatory Visit (HOSPITAL_BASED_OUTPATIENT_CLINIC_OR_DEPARTMENT_OTHER): Payer: Self-pay

## 2023-06-06 ENCOUNTER — Ambulatory Visit (HOSPITAL_BASED_OUTPATIENT_CLINIC_OR_DEPARTMENT_OTHER): Payer: Medicare Other | Admitting: Psychiatry

## 2023-06-06 ENCOUNTER — Other Ambulatory Visit: Payer: Self-pay

## 2023-06-06 ENCOUNTER — Encounter (HOSPITAL_COMMUNITY): Payer: Self-pay | Admitting: Psychiatry

## 2023-06-06 VITALS — BP 99/64 | HR 96 | Ht 65.0 in | Wt 166.0 lb

## 2023-06-06 DIAGNOSIS — F410 Panic disorder [episodic paroxysmal anxiety] without agoraphobia: Secondary | ICD-10-CM

## 2023-06-06 DIAGNOSIS — F418 Other specified anxiety disorders: Secondary | ICD-10-CM | POA: Diagnosis not present

## 2023-06-06 MED ORDER — LORAZEPAM 0.5 MG PO TABS
ORAL_TABLET | ORAL | 0 refills | Status: DC
Start: 2023-06-06 — End: 2023-07-03
  Filled 2023-06-06: qty 20, 10d supply, fill #0

## 2023-06-06 MED ORDER — HUMULIN N KWIKPEN 100 UNIT/ML ~~LOC~~ SUPN
PEN_INJECTOR | SUBCUTANEOUS | 0 refills | Status: DC
  Filled 2023-06-06: qty 9, 25d supply, fill #0

## 2023-06-06 NOTE — Telephone Encounter (Signed)
Tried to call Rosanne Ashing again no answer. Pt has been seen by Kandee Keen this week and was updated. Closing note.

## 2023-06-06 NOTE — Progress Notes (Signed)
Psychiatric Initial Adult Assessment   Patient location; office Provider location; office  Patient Identification: Breanna Webster MRN:  [id number] Date of Evaluation:  06/06/2023 Referral Source: C/L Service, PCP Chief Complaint:   Chief Complaint  Patient presents with   Establish Care   Anxiety   Depression   Visit Diagnosis:    ICD-10-CM   1. Anxiety associated with depression  F41.8     2. Panic attacks  F41.0 LORazepam (ATIVAN) 0.5 MG tablet      History of Present Illness: Breanna Webster is a 78 year old Caucasian married female who came with her daughter Breanna Webster and her husband Breanna Webster for initial appointment.  Patient has a history of coronary artery disease, A-fib, hypothyroidism, cerebellar stroke, shoulder replacement, hypertension, parkinsonian syndrome, type 2 diabetes and recent UTI.  Patient was seen by consultation liaison services 10 days ago when patient was admitted for multiple health concerns.  Patient told she had panic attacks and believe sundowning syndrome.  Patient reported a lot of anxiety, fear, scared for her chronic health issues.  She do not believe any memory issues but feel very sad, anxious and scared when Breanna Webster is over and evening starts.  She noticed the symptoms started more than a year ago when she had a stroke.  Patient admitted that has caused significant lifestyle changes in her life.  She used to be very active and involved in her daily life but now she is very limited.  She used to cook a lot but now only cook when she can sit on the chair.  She uses wheelchair as cannot balance herself very well.  She has mild left-sided weakness.  She admitted feeling hopeless, helpless and having crying spells.  Though she denies any suicidal thoughts or any hallucination but reported feeling more isolated and withdrawn.  She feel losing the interest and very frustrated that not sure what is going on with her body.  She wakes up in the night for the bathroom but also feels  very nervous and anxious.  She was given Valium to help her anxiety which she takes as needed.  She was discharged on Seroquel which helps for her sleep.  She reported lately decreased appetite and had lost weight in past few months.  Recently started to have a home health aide because sometimes she has difficulty doing her ADLs.  As per daughter Breanna Webster it takes 20 minutes to get into the bathtub for bathing.  In the past she has prescribed amitriptyline but she is not taking every night.  Patient, her husband and her daughter had a lot of question about long-term prognosis, medication side effects which were answered.  Though she do not believe that she has depression but admitted there is significant changes happen in her life after her stroke.  Patient was also given the diagnosis of parkinsonian syndrome because of the tremors but has not seen neurology in a while.  She denies any aggression, violence.  She denies any suicidal thoughts.  She denies any prior history of psychiatric inpatient treatment or suicidal attempt.  Associated Signs/Symptoms: Depression Symptoms:  depressed mood, fatigue, feelings of worthlessness/guilt, difficulty concentrating, anxiety, panic attacks, loss of energy/fatigue, weight loss, (Hypo) Manic Symptoms:  Irritable Mood, Anxiety Symptoms:  Excessive Worry, Panic Symptoms, Psychotic Symptoms:   Denies any psychotic symptoms PTSD Symptoms: Negative  Past Psychiatric History: Seen by psychiatry consultation service.  Prescribed Seroquel and Valium.  PCP prescribed amitriptyline but not consistent with the medication.  No history of suicidal attempt,  mania, psychosis.  History of anxiety started status post stroke.  Previous Psychotropic Medications: Yes   Substance Abuse History in the last 12 months:  No.  Consequences of Substance Abuse: NA  Past Medical History:  Past Medical History:  Diagnosis Date   Anxiety    Arthritis    "back" (04/22/2018)    Atrial fibrillation with RVR (HCC) 10/21/2018   Back pain    Bacteremia due to Streptococcus Salivarius 10/07/2018   Blood transfusion without reported diagnosis    CAD (coronary artery disease)    a. 03/2018 s/p PCI/DES to the RCA (3.0x15 Onyx DES).   Carotid artery stenosis    Mild   Cerebellar stroke, acute (HCC) 10/21/2018   Cerebrovascular accident (CVA) due to embolism of right posterior cerebral artery (HCC) 08/03/2020   Chest pain    CHF (congestive heart failure) (HCC)    Chronic lower back pain    Cirrhosis (HCC)    Colon polyps    Diverticulitis    Diverticulosis    Esophageal thickening    seen on pre TAVR CT scan, also questionable cirrhosis. MRI recommended. Will refer to GI   GERD (gastroesophageal reflux disease)    Gout    Grave's disease    H/O total shoulder replacement, left 06/01/2022   Heart murmur    History of cardioembolic stroke 03/29/2020   History of colonic polyps 05/22/2017   History of hiatal hernia    Hyperlipidemia    Hypertension    Hypothyroidism    IBS (irritable bowel syndrome)    Osteopenia    Parkinson's syndrome (HCC) 04/23/2020   Pulmonary nodules    seen on pre TAVR CT. likley benign. no follow up recommended if pt low risk.   S/P TAVR (transcatheter aortic valve replacement)    Shortness of breath on exertion    Stroke (HCC) 2021   x2   Thalassemia minor    Thyroid disease    Type II diabetes mellitus (HCC)     Past Surgical History:  Procedure Laterality Date   32 HOUR PH STUDY N/A 03/03/2018   Procedure: 24 HOUR PH STUDY;  Surgeon: Napoleon Form, MD;  Location: [facility];  Service: Endoscopy;  Laterality: N/A;   COLONOSCOPY  2023   COLONOSCOPY W/ BIOPSIES AND POLYPECTOMY     CORONARY ANGIOGRAPHY Right 04/21/2018   Procedure: CORONARY ANGIOGRAPHY (CATH LAB);  Surgeon: Lyn Records, MD;  Location: [facility] INVASIVE CV LAB;  Service: Cardiovascular;  Laterality: Right;   CORONARY STENT INTERVENTION N/A 04/22/2018    Procedure: CORONARY STENT INTERVENTION;  Surgeon: Lyn Records, MD;  Location: [facility] INVASIVE CV LAB;  Service: Cardiovascular;  Laterality: N/A;   DILATION AND CURETTAGE OF UTERUS     ESOPHAGEAL MANOMETRY N/A 03/03/2018   Procedure: ESOPHAGEAL MANOMETRY (EM);  Surgeon: Napoleon Form, MD;  Location: [facility];  Service: Endoscopy;  Laterality: N/A;   HYSTEROSCOPY     fibroids   LAPAROSCOPIC CHOLECYSTECTOMY  1985   LAPAROSCOPY     fibroids   NISSEN FUNDOPLICATION  1990s   POLYPECTOMY     REVERSE SHOULDER ARTHROPLASTY Left 06/01/2022   Procedure: REVERSE SHOULDER ARTHROPLASTY;  Surgeon: Beverely Low, MD;  Location: [facility] ORS;  Service: Orthopedics;  Laterality: Left;  choice with interscalene block   RIGHT/LEFT HEART CATH AND CORONARY ANGIOGRAPHY N/A 02/20/2018   Procedure: RIGHT/LEFT HEART CATH AND CORONARY ANGIOGRAPHY;  Surgeon: Lyn Records, MD;  Location: [facility] INVASIVE CV LAB;  Service: Cardiovascular;  Laterality: N/A;  TEE WITHOUT CARDIOVERSION N/A 07/08/2018   Procedure: TRANSESOPHAGEAL ECHOCARDIOGRAM (TEE);  Surgeon: Kathleene Hazel, MD;  Location: [facility] OR;  Service: Open Heart Surgery;  Laterality: N/A;   TEE WITHOUT CARDIOVERSION  10/07/2018   TEE WITHOUT CARDIOVERSION N/A 10/07/2018   Procedure: TRANSESOPHAGEAL ECHOCARDIOGRAM (TEE);  Surgeon: Jake Bathe, MD;  Location: [facility] ENDOSCOPY;  Service: Cardiovascular;  Laterality: N/A;   TONSILLECTOMY     age 42   TRANSCATHETER AORTIC VALVE REPLACEMENT, TRANSFEMORAL N/A 07/08/2018   Procedure: TRANSCATHETER AORTIC VALVE REPLACEMENT, TRANSFEMORAL;  Surgeon: Kathleene Hazel, MD;  Location: [facility] OR;  Service: Open Heart Surgery;  Laterality: N/A;    Family Psychiatric History: Reviewed  Family History:  Family History  Adopted: Yes  Problem Relation Age of Onset   Healthy Son        x 2   Headache Other        Cluster headaches   Heart failure Mother    Colon cancer Neg Hx    Pancreatic cancer Neg Hx    Rectal  cancer Neg Hx    Stomach cancer Neg Hx     Social History:   Social History   Socioeconomic History   Marital status: Married    Spouse name: Not on file   Number of children: 2   Years of education: Not on file   Highest education level: Associate degree: academic program  Occupational History   Occupation: Health and safety inspector of the [employer]  Tobacco Use   Smoking status: Never    Passive exposure: Never   Smokeless tobacco: Never  Vaping Use   Vaping status: Never Used  Substance and Sexual Activity   Alcohol use: No    Alcohol/week: 0.0 standard drinks of alcohol   Drug use: Never   Sexual activity: Not Currently  Other Topics Concern   Not on file  Social History Narrative   Lives with husband in a one story home.     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   Education: college.   Social Drivers of Health   Financial Resource Strain: Patient Declined (12/09/2022)   Overall Financial Resource Strain (CARDIA)    Difficulty of Paying Living Expenses: Patient declined  Food Insecurity: No Food Insecurity (05/24/2023)   Hunger Vital Sign    Worried About Running Out of Food in the Last Year: Never true    Ran Out of Food in the Last Year: Never true  Transportation Needs: No Transportation Needs (05/24/2023)   PRAPARE - Administrator, Civil Service (Medical): No    Lack of Transportation (Non-Medical): No  Physical Activity: Unknown (12/09/2022)   Exercise Vital Sign    Days of Exercise per Week: Patient declined    Minutes of Exercise per Session: 40 min  Stress: No Stress Concern Present (05/02/2023)   Received from [facility] of Occupational Health - Occupational Stress Questionnaire    Feeling of Stress : Not at all  Social Connections: Unknown (12/09/2022)   Social Connection and Isolation Panel [NHANES]    Frequency of Communication with Friends and  Family: More than three times a week    Frequency of Social Gatherings with Friends and Family: Patient declined    Attends Religious Services: Patient declined    Active Member of Clubs or Organizations: Patient declined    Attends Engineer, structural: More than 4 times per year  Marital Status: Married    Additional Social History: Patient is married to her husband for 60 years.  She used to work as a Interior and spatial designer but now retired.  She has a daughter who came with her on her appointment.  Allergies:   Allergies  Allergen Reactions   Statins Other (See Comments)    Tried simvastatin, atorvastatin; on Repatha    Metabolic Disorder Labs: Lab Results  Component Value Date   HGBA1C 6.7 (H) 05/20/2023   MPG 145.59 05/20/2023   MPG 122.63 05/16/2022   No results found for: "PROLACTIN" Lab Results  Component Value Date   CHOL 132 05/19/2023   TRIG 124 05/19/2023   HDL 33 (L) 05/19/2023   CHOLHDL 4.0 05/19/2023   VLDL 25 05/19/2023   LDLCALC 74 05/19/2023   LDLCALC 105 (H) 02/07/2023   Lab Results  Component Value Date   TSH 1.44 12/05/2020    Therapeutic Level Labs: No results found for: "LITHIUM" No results found for: "CBMZ" No results found for: "VALPROATE"  Current Medications: Current Outpatient Medications  Medication Sig Dispense Refill   acetaminophen (TYLENOL) 500 MG tablet Take 1 tablet (500 mg total) by mouth 4 (four) times daily. 30 tablet 0   allopurinol (ZYLOPRIM) 100 MG tablet Take 1 tablet (100 mg total) by mouth daily. 90 tablet 1   amitriptyline (ELAVIL) 25 MG tablet Take 1 tablet (25 mg total) by mouth at bedtime. (Patient taking differently: Take 25 mg by mouth at bedtime as needed for sleep (Headaches).) 90 tablet 1   apixaban (ELIQUIS) 5 MG TABS tablet Take 1 tablet (5 mg total) by mouth 2 (two) times daily. 60 tablet 3   b complex vitamins capsule Take 1 capsule by mouth daily.     bisacodyl (DULCOLAX) 5 MG EC tablet Take 1 Tablet with each  dose pf pain medicine. (Patient taking differently: Take 5 mg by mouth daily as needed for mild constipation or moderate constipation. Take 1 Tablet with each dose with pain medicine.) 100 tablet 1   Cholecalciferol (D3 PO) Take 1 tablet by mouth daily.     colchicine 0.6 MG tablet Take 1 tablet (0.6 mg total) by mouth daily. as needed for gout (Patient taking differently: Take 0.6 mg by mouth daily as needed (gout).) 90 tablet 0   diazepam (VALIUM) 2 MG tablet Take 1 tablet (2 mg total) by mouth every 8 (eight) hours as needed for up to 20 doses for anxiety. 20 tablet 0   diltiazem (CARTIA XT) 120 MG 24 hr capsule Take 1 capsule (120 mg total) by mouth daily. 90 capsule 3   Insulin NPH, Human,, Isophane, (HUMULIN N KWIKPEN) 100 UNIT/ML Kiwkpen Inject 20 Units into the skin in the morning and at bedtime. Decrease your dose to 10 units every morning, and 7 units every evening still appetite back to normal (Patient taking differently: Inject 12-20 Units into the skin in the morning and at bedtime. 20 units in the morning and 12-14 units in the evening)     insulin regular (HUMULIN R) 100 units/mL injection Please decrease your dose to 5 units 3 times daily before meals until appetite picks up back to normal     Lactulose 20 GM/30ML SOLN Take 15 mLs (10 g total) by mouth 3 (three) times daily. 1350 mL 0   metFORMIN (GLUCOPHAGE-XR) 500 MG 24 hr tablet Take 1 tablet by mouth 2 times daily 180 tablet 2   Multiple Vitamin (MULITIVITAMIN WITH MINERALS) TABS Take 1 tablet by mouth  daily.     nitroGLYCERIN (NITROSTAT) 0.4 MG SL tablet Place 1 tablet (0.4 mg total) under the tongue every 5 (five) minutes as needed for chest pain. 25 tablet 4   omeprazole (PRILOSEC) 20 MG capsule Take 1 capsule (20 mg total) by mouth daily. 90 capsule 3   ondansetron (ZOFRAN) 4 MG tablet Take 1 tablet (4 mg total) by mouth every 8 (eight) hours as needed for nausea or vomiting. 40 tablet 1   oxyCODONE (OXY IR/ROXICODONE) 5 MG  immediate release tablet Take 5 mg by mouth every 6 (six) hours as needed for severe pain (pain score 7-10).     polyethylene glycol (MIRALAX) 17 g packet Take 17 g by mouth daily as needed.     potassium chloride SA (KLOR-CON M) 20 MEQ tablet Take 0.5 tablets (10 mEq total) by mouth daily. Take in concurrence with torsemide. (Patient taking differently: Take 20 mEq by mouth daily. Take in concurrence with torsemide.)     QUEtiapine (SEROQUEL) 25 MG tablet Take 1 tablet (25 mg total) by mouth at bedtime. 14 tablet 0   spironolactone (ALDACTONE) 25 MG tablet Take 0.5 tablets (12.5 mg total) by mouth daily. 15 tablet 0   SYNTHROID 137 MCG tablet Take 1 tablet (137 mcg total) by mouth every morning on empty stomach. 90 tablet 1   tiZANidine (ZANAFLEX) 4 MG tablet Take 1 tablet (4 mg total) by mouth 3 (three) times daily. (Patient taking differently: Take 4 mg by mouth daily as needed for muscle spasms.) 90 tablet 1   torsemide (DEMADEX) 20 MG tablet Take 2 tablets (40 mg total) by mouth daily. 60 tablet 0   Vibegron (GEMTESA) 75 MG TABS Take 75 mg by mouth at bedtime.     vitamin E 180 MG (400 UNITS) capsule Take 400 Units by mouth daily.     No current facility-administered medications for this visit.    Musculoskeletal: Strength & Muscle Tone: decreased and left side weakness Gait & Station:  using wheel chair Patient leans: N/A  Psychiatric Specialty Exam: Review of Systems  Constitutional:  Positive for activity change, appetite change and fatigue.  Musculoskeletal:  Positive for back pain.  Neurological:  Positive for tremors.  Psychiatric/Behavioral:  Positive for decreased concentration, dysphoric mood and sleep disturbance.     Blood pressure 99/64, pulse 96, height 5\' 5"  (1.651 m), weight 166 lb (75.3 kg).Body mass index is 27.62 kg/m.  General Appearance: Casual  Eye Contact:  Good  Speech:  Slow  Volume:  Decreased  Mood:  Anxious, Dysphoric, and Hopeless  Affect:  Depressed   Thought Process:  Descriptions of Associations: Intact  Orientation:  Full (Time, Place, and Person)  Thought Content:  Rumination  Suicidal Thoughts:  No  Homicidal Thoughts:  No  Memory:  Immediate;   Good Recent;   Good Remote;   Fair  Judgement:  Intact  Insight:  Shallow  Psychomotor Activity:  Decreased and Tremor  Concentration:  Concentration: Fair and Attention Span: Fair  Recall:  [employer] of Knowledge:Fair  Language: Good  Akathisia:  No  Handed:  Right  AIMS (if indicated):  not done  Assets:  Communication Skills Desire for Improvement Housing Resilience Social Support  ADL's:  Intact  Cognition: WNL  Sleep:  Fair   Screenings: Mini-Mental    Flowsheet Row Office Visit from 12/21/2019 in [facility]  Total Score (max 30 points ) 24      PHQ2-9    Flowsheet  Row Clinical Support from 08/29/2022 in [facility] [place] Office Visit from 04/10/2022 in [facility] [place] Clinical Support from 04/03/2021 in [facility] [place] Chronic Care Management from 10/03/2020 in [facility] [place] Clinical Support from 03/14/2020 in [facility] [place]  PHQ-2 Total Score 0 1 0 0 0  PHQ-9 Total Score -- -- -- -- 0      Flowsheet Row ED from 05/27/2023 in [facility] at [facility] Most recent reading at 05/27/2023  3:18 PM ED from 05/27/2023 in [facility] Emergency Department at [facility] Most recent reading at 05/27/2023  2:23 AM ED to Hosp-Admission (Discharged) from 05/18/2023 in [place] [facility] Most recent reading at 05/18/2023 10:05 AM  C-SSRS RISK CATEGORY No Risk No Risk No Risk       Assessment and Plan: Patient is 78 year old Caucasian female with multiple health issues started to have severe anxiety panic attacks and depression more than a year ago.  I  review psychosocial stressors, current medication, comorbid medical issues.  I had a long discussion with the patient, her daughter and patient's husband about need of psychotropic medication to help her depression, anxiety and coping skills.  Patient has question about CBD Gummies.  Patient is taking pain medicine and I encourage she discuss with the primary care/pain management whether she can take Gummies or not.  I discussed long-term prognosis and need to see a therapist to help her coping skills.  Patient has tremors in her left hand.  She has difficulty keeping her balance and needed wheelchair.  I do believe patient needed neurocognitive testing and I will send my note to her neurologist.  Patient has upcoming appointment with Dr. Vickey Huger.  We discussed to discontinue Seroquel 25 mg as it has potential to cause increased risk of stroke with a history of stroke in the past.  I also recommend not to take the Valium since patient feels some time it does not work as soon and quick.  Recommend to take the lorazepam 0.5 mg as needed for severe anxiety or panic attack.  She was prescribed amitriptyline 25 mg which she is not taking and I recommended to take every night to help her sleep, anxiety and depression.  So far patient do not recall any side effects of the medication.  We will follow-up in 3 weeks.  I will also refer her to see therapist to help her coping skills.  Discussed safety concerns at any time having active suicidal thoughts or homicidal thoughts and she need to call 911 or go to local emergency room.  Will follow-up in 3 weeks.  Patient and her husband and her daughter has questions which were answered.  Collaboration of Care: Other provider involved in patient's care AEB notes are available in epic to review.  I will also forward my note to neurologist and primary care.  Patient/Guardian was advised Release of Information must be obtained prior to any record release in order to collaborate  their care with an outside provider. Patient/Guardian was advised if they have not already done so to contact the registration department to sign all necessary forms in order for [name] to release information regarding their care.   Consent: Patient/Guardian gives verbal consent for treatment and assignment of benefits for services provided during this visit. Patient/Guardian expressed understanding and agreed to proceed.   I provided 74 minutes face-to-face time during  this encounter.  Cleotis Nipper, MD 2/13/20259:08 AM

## 2023-06-07 ENCOUNTER — Telehealth (HOSPITAL_COMMUNITY): Payer: Self-pay

## 2023-06-07 NOTE — Telephone Encounter (Signed)
Patients husband wanted you to know that the patient had a really bad night, she was up and down all night and had a panic attack at one point, her husband gave her an ativan and said that it did not help at all. He said he was just calling to give you an update and did not say he wanted a call back or anything changed. I have reached out to [facility] and I am waiting for a call back.

## 2023-06-07 NOTE — Telephone Encounter (Signed)
Thanks for the update and reaching out to the neurology.

## 2023-06-08 ENCOUNTER — Other Ambulatory Visit (HOSPITAL_BASED_OUTPATIENT_CLINIC_OR_DEPARTMENT_OTHER): Payer: Self-pay

## 2023-06-08 DIAGNOSIS — I2510 Atherosclerotic heart disease of native coronary artery without angina pectoris: Secondary | ICD-10-CM | POA: Diagnosis not present

## 2023-06-08 DIAGNOSIS — N179 Acute kidney failure, unspecified: Secondary | ICD-10-CM | POA: Diagnosis not present

## 2023-06-08 DIAGNOSIS — I110 Hypertensive heart disease with heart failure: Secondary | ICD-10-CM | POA: Diagnosis not present

## 2023-06-08 DIAGNOSIS — A4151 Sepsis due to Escherichia coli [E. coli]: Secondary | ICD-10-CM | POA: Diagnosis not present

## 2023-06-08 DIAGNOSIS — N16 Renal tubulo-interstitial disorders in diseases classified elsewhere: Secondary | ICD-10-CM | POA: Diagnosis not present

## 2023-06-08 DIAGNOSIS — I5032 Chronic diastolic (congestive) heart failure: Secondary | ICD-10-CM | POA: Diagnosis not present

## 2023-06-10 ENCOUNTER — Telehealth (HOSPITAL_COMMUNITY): Payer: Self-pay

## 2023-06-10 ENCOUNTER — Telehealth: Payer: Self-pay

## 2023-06-10 DIAGNOSIS — A4151 Sepsis due to Escherichia coli [E. coli]: Secondary | ICD-10-CM | POA: Diagnosis not present

## 2023-06-10 DIAGNOSIS — I2510 Atherosclerotic heart disease of native coronary artery without angina pectoris: Secondary | ICD-10-CM | POA: Diagnosis not present

## 2023-06-10 DIAGNOSIS — N179 Acute kidney failure, unspecified: Secondary | ICD-10-CM | POA: Diagnosis not present

## 2023-06-10 DIAGNOSIS — I5032 Chronic diastolic (congestive) heart failure: Secondary | ICD-10-CM | POA: Diagnosis not present

## 2023-06-10 DIAGNOSIS — I110 Hypertensive heart disease with heart failure: Secondary | ICD-10-CM | POA: Diagnosis not present

## 2023-06-10 DIAGNOSIS — N16 Renal tubulo-interstitial disorders in diseases classified elsewhere: Secondary | ICD-10-CM | POA: Diagnosis not present

## 2023-06-10 NOTE — Telephone Encounter (Addendum)
Patients husband is calling again, he states that the patient had a panic attack lasting most of the night, they called EMS but did not let them take her due to a bad ED visit in the past. He said he would like a call from you. Please review and advise, thank you    * I just talked to [facility], patient has an appointment with Dr. Vickey Huger on 2/27

## 2023-06-10 NOTE — Telephone Encounter (Signed)
I returned phone call and spoke to patient's husband.  Apparently patient had a severe panic attack last night and she could not sleep.  She was given Ativan which eventually worked after few hours.  Recommend to start it Ativan 0.5 mg every day and take the second pill if she had a panic attack.  She may need standing dose of Ativan to prevent these anxiety attacks.  I also spoke to her to keep the appointment with neurology which is coming up on February 27.  I also encouraged to keep appointment with therapist in our office.

## 2023-06-10 NOTE — Telephone Encounter (Signed)
Copied from CRM [id number]. Topic: Clinical - Home Health Verbal Orders >> Jun 10, 2023  1:10 PM Irine Seal wrote: -Caller/Agency: Beth RN- [facility]  Callback Number: [phone] Service Requested:  nurse aid  Frequency: 1 week, 5  Any new concerns about the patient? Beth stated that there are several medication discrepancies she needs to discuss, when I asked what medications she stated she would rather just speak with the nurse in regards to those instead of listing them out.

## 2023-06-11 ENCOUNTER — Other Ambulatory Visit: Payer: Self-pay | Admitting: Adult Health

## 2023-06-11 ENCOUNTER — Other Ambulatory Visit (HOSPITAL_BASED_OUTPATIENT_CLINIC_OR_DEPARTMENT_OTHER): Payer: Self-pay

## 2023-06-11 DIAGNOSIS — Z79899 Other long term (current) drug therapy: Secondary | ICD-10-CM

## 2023-06-11 DIAGNOSIS — E118 Type 2 diabetes mellitus with unspecified complications: Secondary | ICD-10-CM

## 2023-06-11 DIAGNOSIS — Z952 Presence of prosthetic heart valve: Secondary | ICD-10-CM

## 2023-06-11 DIAGNOSIS — I5033 Acute on chronic diastolic (congestive) heart failure: Secondary | ICD-10-CM

## 2023-06-11 DIAGNOSIS — I5032 Chronic diastolic (congestive) heart failure: Secondary | ICD-10-CM

## 2023-06-11 DIAGNOSIS — M7989 Other specified soft tissue disorders: Secondary | ICD-10-CM

## 2023-06-11 MED ORDER — POTASSIUM CHLORIDE CRYS ER 10 MEQ PO TBCR
10.0000 meq | EXTENDED_RELEASE_TABLET | Freq: Every day | ORAL | 1 refills | Status: DC
Start: 2023-06-11 — End: 2023-07-03
  Filled 2023-06-11: qty 90, 90d supply, fill #0

## 2023-06-11 NOTE — Telephone Encounter (Signed)
Spoke to [place] and she wanted to know if insulin was filled by [facility]. She also articulated the importance of pt taking potassium. She is concerned bc pt is only taking OTC potassium. She wants to know if pt need this refilled. Also, requesting verbal orders for verbal orders for 1 week, 5 days.

## 2023-06-12 ENCOUNTER — Telehealth (HOSPITAL_COMMUNITY): Payer: Self-pay | Admitting: *Deleted

## 2023-06-12 ENCOUNTER — Other Ambulatory Visit (HOSPITAL_BASED_OUTPATIENT_CLINIC_OR_DEPARTMENT_OTHER): Payer: Self-pay

## 2023-06-12 DIAGNOSIS — F410 Panic disorder [episodic paroxysmal anxiety] without agoraphobia: Secondary | ICD-10-CM

## 2023-06-12 MED ORDER — CLONAZEPAM 0.5 MG PO TABS
0.5000 mg | ORAL_TABLET | Freq: Two times a day (BID) | ORAL | 0 refills | Status: DC | PRN
  Filled 2023-06-12: qty 30, 15d supply, fill #0

## 2023-06-12 NOTE — Telephone Encounter (Signed)
Writer spoke to pt's husband, [name], who called to advise that pt had "the worst panic attack ever" last night. EMS was called but they did not take pt to hospital. Husband states that after a "long time' she was able to calm down. He also says that she's had panic attacks for 3 nights in a row. Pt has an appointment scheduled for 07/04/23 and they would like to pt to be seen earlier. Please review and advise.

## 2023-06-12 NOTE — Telephone Encounter (Signed)
I just send it.

## 2023-06-12 NOTE — Telephone Encounter (Signed)
Writer LVM for pt/husband requesting call back to office regarding changing anxiety medication and that front is working to see they can get pt in earlier than scheduled appointment 07/04/23.

## 2023-06-12 NOTE — Telephone Encounter (Signed)
I can see him earlier if there is appointment available.  In the meantime recommend to discontinue Ativan and try Klonopin 0.5 mg twice a day to help the anxiety attacks.  Should not take Ativan and Klonopin together.

## 2023-06-12 NOTE — Telephone Encounter (Signed)
What # do you want me call in? Klonopin 0.5 mg

## 2023-06-13 NOTE — Telephone Encounter (Signed)
Beth RN- Diannia Ruder advised of message below. She will contact the pt with update.

## 2023-06-14 ENCOUNTER — Other Ambulatory Visit (HOSPITAL_BASED_OUTPATIENT_CLINIC_OR_DEPARTMENT_OTHER): Payer: Self-pay

## 2023-06-14 ENCOUNTER — Telehealth (HOSPITAL_COMMUNITY): Payer: Self-pay

## 2023-06-14 NOTE — Telephone Encounter (Signed)
He can give klonopin at bedtime.

## 2023-06-14 NOTE — Telephone Encounter (Signed)
I spoke with patients husband about the change from Ativan to Klonopin. He understands he is not to give her the ativan anymore. He said that her worst time is at night and would like to know if there is something he can give her before bed to help her sleep through the night. Please review and advise, thank you

## 2023-06-14 NOTE — Telephone Encounter (Signed)
I spoke to husband and advised him to give the Klonopin twice a day, since she is usually fine in the morning, he will give one after lunch and one before bed. He will call us on Monday to let us know how the weekend goes.

## 2023-06-15 ENCOUNTER — Emergency Department (HOSPITAL_COMMUNITY): Payer: Medicare Other

## 2023-06-15 ENCOUNTER — Other Ambulatory Visit (HOSPITAL_BASED_OUTPATIENT_CLINIC_OR_DEPARTMENT_OTHER): Payer: Self-pay

## 2023-06-15 ENCOUNTER — Emergency Department (HOSPITAL_COMMUNITY)
Admission: EM | Admit: 2023-06-15 | Discharge: 2023-06-17 | Disposition: A | Discharge status: Home / Self Care | Payer: Medicare Other | Attending: Emergency Medicine | Admitting: Emergency Medicine

## 2023-06-15 ENCOUNTER — Other Ambulatory Visit: Payer: Self-pay

## 2023-06-15 DIAGNOSIS — Z79899 Other long term (current) drug therapy: Secondary | ICD-10-CM | POA: Insufficient documentation

## 2023-06-15 DIAGNOSIS — R450 Nervousness: Secondary | ICD-10-CM

## 2023-06-15 DIAGNOSIS — R4182 Altered mental status, unspecified: Secondary | ICD-10-CM | POA: Diagnosis not present

## 2023-06-15 DIAGNOSIS — F419 Anxiety disorder, unspecified: Secondary | ICD-10-CM | POA: Diagnosis not present

## 2023-06-15 DIAGNOSIS — R9431 Abnormal electrocardiogram [ECG] [EKG]: Secondary | ICD-10-CM | POA: Diagnosis not present

## 2023-06-15 DIAGNOSIS — G20A1 Parkinson's disease without dyskinesia, without mention of fluctuations: Secondary | ICD-10-CM | POA: Insufficient documentation

## 2023-06-15 DIAGNOSIS — E119 Type 2 diabetes mellitus without complications: Secondary | ICD-10-CM | POA: Diagnosis not present

## 2023-06-15 DIAGNOSIS — Z794 Long term (current) use of insulin: Secondary | ICD-10-CM | POA: Insufficient documentation

## 2023-06-15 DIAGNOSIS — F064 Anxiety disorder due to known physiological condition: Secondary | ICD-10-CM | POA: Diagnosis present

## 2023-06-15 DIAGNOSIS — R0789 Other chest pain: Secondary | ICD-10-CM | POA: Diagnosis not present

## 2023-06-15 DIAGNOSIS — R064 Hyperventilation: Secondary | ICD-10-CM | POA: Diagnosis not present

## 2023-06-15 DIAGNOSIS — R4689 Other symptoms and signs involving appearance and behavior: Secondary | ICD-10-CM | POA: Diagnosis not present

## 2023-06-15 DIAGNOSIS — I2510 Atherosclerotic heart disease of native coronary artery without angina pectoris: Secondary | ICD-10-CM | POA: Diagnosis not present

## 2023-06-15 DIAGNOSIS — R4583 Excessive crying of child, adolescent or adult: Secondary | ICD-10-CM | POA: Diagnosis not present

## 2023-06-15 DIAGNOSIS — G20C Parkinsonism, unspecified: Secondary | ICD-10-CM | POA: Diagnosis present

## 2023-06-15 DIAGNOSIS — U071 COVID-19: Secondary | ICD-10-CM | POA: Diagnosis not present

## 2023-06-15 DIAGNOSIS — F410 Panic disorder [episodic paroxysmal anxiety] without agoraphobia: Secondary | ICD-10-CM | POA: Diagnosis not present

## 2023-06-15 DIAGNOSIS — Z7901 Long term (current) use of anticoagulants: Secondary | ICD-10-CM | POA: Diagnosis not present

## 2023-06-15 DIAGNOSIS — R4189 Other symptoms and signs involving cognitive functions and awareness: Secondary | ICD-10-CM | POA: Diagnosis present

## 2023-06-15 DIAGNOSIS — I10 Essential (primary) hypertension: Secondary | ICD-10-CM | POA: Insufficient documentation

## 2023-06-15 DIAGNOSIS — Z7984 Long term (current) use of oral hypoglycemic drugs: Secondary | ICD-10-CM | POA: Insufficient documentation

## 2023-06-15 DIAGNOSIS — R0689 Other abnormalities of breathing: Secondary | ICD-10-CM | POA: Diagnosis not present

## 2023-06-15 DIAGNOSIS — R29818 Other symptoms and signs involving the nervous system: Secondary | ICD-10-CM | POA: Diagnosis present

## 2023-06-15 LAB — RESP PANEL BY RT-PCR (RSV, FLU A&B, COVID)  RVPGX2
Influenza A by PCR: NEGATIVE
Influenza B by PCR: NEGATIVE
Resp Syncytial Virus by PCR: NEGATIVE
SARS Coronavirus 2 by RT PCR: POSITIVE — AB

## 2023-06-15 LAB — COMPREHENSIVE METABOLIC PANEL
ALT: 28 U/L (ref 0–44)
AST: 28 U/L (ref 15–41)
Albumin: 4.2 g/dL (ref 3.5–5.0)
Alkaline Phosphatase: 62 U/L (ref 38–126)
Anion gap: 13 (ref 5–15)
BUN: 20 mg/dL (ref 8–23)
CO2: 15 mmol/L — ABNORMAL LOW (ref 22–32)
Calcium: 10.5 mg/dL — ABNORMAL HIGH (ref 8.9–10.3)
Chloride: 109 mmol/L (ref 98–111)
Creatinine, Ser: 0.82 mg/dL (ref 0.44–1.00)
GFR, Estimated: >60 mL/min (ref >60)
Glucose, Bld: 157 mg/dL — ABNORMAL HIGH (ref 70–99)
Potassium: 3.8 mmol/L (ref 3.5–5.1)
Sodium: 137 mmol/L (ref 135–145)
Total Bilirubin: 1.3 mg/dL — ABNORMAL HIGH (ref 0.0–1.2)
Total Protein: 7.6 g/dL (ref 6.5–8.1)

## 2023-06-15 LAB — URINALYSIS, ROUTINE W REFLEX MICROSCOPIC
Bilirubin Urine: NEGATIVE
Glucose, UA: NEGATIVE mg/dL
Hgb urine dipstick: NEGATIVE
Ketones, ur: NEGATIVE mg/dL
Leukocytes,Ua: NEGATIVE
Nitrite: NEGATIVE
Protein, ur: NEGATIVE mg/dL
Specific Gravity, Urine: 1.006 (ref 1.005–1.030)
pH: 6 (ref 5.0–8.0)

## 2023-06-15 LAB — RAPID URINE DRUG SCREEN, HOSP PERFORMED
Amphetamines: NOT DETECTED
Barbiturates: NOT DETECTED
Benzodiazepines: POSITIVE — AB
Cocaine: NOT DETECTED
Opiates: NOT DETECTED
Tetrahydrocannabinol: NOT DETECTED

## 2023-06-15 LAB — CBC WITH DIFFERENTIAL/PLATELET
Abs Immature Granulocytes: 0.04 10*3/uL (ref 0.00–0.07)
Basophils Absolute: 0.1 10*3/uL (ref 0.0–0.1)
Basophils Relative: 1 %
Eosinophils Absolute: 0 10*3/uL (ref 0.0–0.5)
Eosinophils Relative: 0 %
HCT: 32.3 % — ABNORMAL LOW (ref 36.0–46.0)
Hemoglobin: 10.1 g/dL — ABNORMAL LOW (ref 12.0–15.0)
Immature Granulocytes: 0 %
Lymphocytes Relative: 18 %
Lymphs Abs: 2.4 10*3/uL (ref 0.7–4.0)
MCH: 18.2 pg — ABNORMAL LOW (ref 26.0–34.0)
MCHC: 31.3 g/dL (ref 30.0–36.0)
MCV: 58.2 fL — ABNORMAL LOW (ref 80.0–100.0)
Monocytes Absolute: 0.9 10*3/uL (ref 0.1–1.0)
Monocytes Relative: 7 %
Neutro Abs: 9.8 10*3/uL — ABNORMAL HIGH (ref 1.7–7.7)
Neutrophils Relative %: 74 %
Platelets: 207 10*3/uL (ref 150–400)
RBC: 5.55 MIL/uL — ABNORMAL HIGH (ref 3.87–5.11)
RDW: 19.9 % — ABNORMAL HIGH (ref 11.5–15.5)
WBC: 13.2 10*3/uL — ABNORMAL HIGH (ref 4.0–10.5)
nRBC: 0.2 % (ref 0.0–0.2)

## 2023-06-15 LAB — CBG MONITORING, ED: Glucose-Capillary: 157 mg/dL — ABNORMAL HIGH (ref 70–99)

## 2023-06-15 LAB — ETHANOL: Alcohol, Ethyl (B): <10 mg/dL (ref <10)

## 2023-06-15 MED ORDER — GABAPENTIN 100 MG PO CAPS
100.0000 mg | ORAL_CAPSULE | Freq: Three times a day (TID) | ORAL | 0 refills | Status: DC
  Filled 2023-06-15: qty 90, 30d supply, fill #0

## 2023-06-15 MED ORDER — HALOPERIDOL LACTATE 5 MG/ML IJ SOLN
2.0000 mg | Freq: Once | INTRAMUSCULAR | Status: AC
  Administered 2023-06-15: 2 mg via INTRAMUSCULAR
  Filled 2023-06-15: qty 1

## 2023-06-15 MED ORDER — METFORMIN HCL ER 500 MG PO TB24
500.0000 mg | ORAL_TABLET | Freq: Two times a day (BID) | ORAL | Status: DC
  Administered 2023-06-15 – 2023-06-17 (×4): 500 mg via ORAL
  Filled 2023-06-15 (×4): qty 1

## 2023-06-15 MED ORDER — APIXABAN 5 MG PO TABS
5.0000 mg | ORAL_TABLET | Freq: Two times a day (BID) | ORAL | Status: DC
  Administered 2023-06-15 – 2023-06-17 (×4): 5 mg via ORAL
  Filled 2023-06-15 (×4): qty 1

## 2023-06-15 MED ORDER — DILTIAZEM HCL ER COATED BEADS 120 MG PO CP24
120.0000 mg | ORAL_CAPSULE | Freq: Every day | ORAL | Status: DC
  Administered 2023-06-15 – 2023-06-17 (×3): 120 mg via ORAL
  Filled 2023-06-15 (×3): qty 1

## 2023-06-15 MED ORDER — POTASSIUM CHLORIDE CRYS ER 10 MEQ PO TBCR
10.0000 meq | EXTENDED_RELEASE_TABLET | Freq: Every day | ORAL | Status: DC
  Administered 2023-06-15 – 2023-06-17 (×3): 10 meq via ORAL
  Filled 2023-06-15 (×3): qty 1

## 2023-06-15 MED ORDER — MIRABEGRON ER 25 MG PO TB24
25.0000 mg | ORAL_TABLET | Freq: Every evening | ORAL | Status: DC
  Administered 2023-06-15 – 2023-06-16 (×2): 25 mg via ORAL
  Filled 2023-06-15 (×2): qty 1

## 2023-06-15 MED ORDER — INSULIN NPH (HUMAN) (ISOPHANE) 100 UNIT/ML ~~LOC~~ SUSP
16.0000 [IU] | Freq: Every day | SUBCUTANEOUS | Status: DC
  Administered 2023-06-15 – 2023-06-16 (×2): 16 [IU] via SUBCUTANEOUS

## 2023-06-15 MED ORDER — ADULT MULTIVITAMIN W/MINERALS CH
1.0000 | ORAL_TABLET | Freq: Every day | ORAL | Status: DC
  Administered 2023-06-16 – 2023-06-17 (×2): 1 via ORAL
  Filled 2023-06-15 (×2): qty 1

## 2023-06-15 MED ORDER — INSULIN NPH (HUMAN) (ISOPHANE) 100 UNIT/ML ~~LOC~~ SUSP
20.0000 [IU] | Freq: Every day | SUBCUTANEOUS | Status: DC
  Filled 2023-06-15: qty 10

## 2023-06-15 MED ORDER — CLONAZEPAM 0.5 MG PO TABS
0.5000 mg | ORAL_TABLET | Freq: Two times a day (BID) | ORAL | Status: DC | PRN
  Administered 2023-06-15: 0.5 mg via ORAL
  Filled 2023-06-15: qty 1

## 2023-06-15 MED ORDER — SPIRONOLACTONE 12.5 MG HALF TABLET
12.5000 mg | ORAL_TABLET | Freq: Every day | ORAL | Status: DC
  Administered 2023-06-15 – 2023-06-17 (×3): 12.5 mg via ORAL
  Filled 2023-06-15 (×3): qty 1

## 2023-06-15 MED ORDER — BISACODYL 5 MG PO TBEC
5.0000 mg | DELAYED_RELEASE_TABLET | Freq: Every day | ORAL | Status: DC | PRN
  Administered 2023-06-15: 5 mg via ORAL
  Filled 2023-06-15: qty 1

## 2023-06-15 MED ORDER — TORSEMIDE 20 MG PO TABS
40.0000 mg | ORAL_TABLET | Freq: Every day | ORAL | Status: DC
  Administered 2023-06-16 – 2023-06-17 (×2): 40 mg via ORAL
  Filled 2023-06-15 (×2): qty 2

## 2023-06-15 MED ORDER — LORAZEPAM 1 MG PO TABS
1.0000 mg | ORAL_TABLET | Freq: Once | ORAL | Status: AC | PRN
  Administered 2023-06-16: 1 mg via ORAL
  Filled 2023-06-15: qty 1

## 2023-06-15 MED ORDER — LEVOTHYROXINE SODIUM 137 MCG PO TABS
137.0000 ug | ORAL_TABLET | Freq: Every morning | ORAL | Status: DC
  Administered 2023-06-16 – 2023-06-17 (×2): 137 ug via ORAL
  Filled 2023-06-15 (×2): qty 1

## 2023-06-15 MED ORDER — INSULIN NPH (HUMAN) (ISOPHANE) 100 UNIT/ML ~~LOC~~ SUSP
16.0000 [IU] | Freq: Every day | SUBCUTANEOUS | Status: DC
  Filled 2023-06-15: qty 10

## 2023-06-15 MED ORDER — TIZANIDINE HCL 4 MG PO TABS
4.0000 mg | ORAL_TABLET | Freq: Every day | ORAL | Status: DC | PRN
  Administered 2023-06-15 – 2023-06-16 (×4): 4 mg via ORAL
  Filled 2023-06-15 (×4): qty 1

## 2023-06-15 MED ORDER — LORAZEPAM 2 MG/ML IJ SOLN
2.0000 mg | Freq: Once | INTRAMUSCULAR | Status: AC
  Administered 2023-06-15: 2 mg via INTRAMUSCULAR
  Filled 2023-06-15: qty 1

## 2023-06-15 MED ORDER — CLONAZEPAM 0.5 MG PO TABS
0.5000 mg | ORAL_TABLET | Freq: Two times a day (BID) | ORAL | 0 refills | Status: DC | PRN
  Filled 2023-06-15: qty 30, 15d supply, fill #0

## 2023-06-15 MED ORDER — INSULIN NPH (HUMAN) (ISOPHANE) 100 UNIT/ML ~~LOC~~ SUSP: 20.0000 [IU] | Freq: Every day | SUBCUTANEOUS | Status: DC

## 2023-06-15 MED ORDER — CLONAZEPAM 0.5 MG PO TABS
0.5000 mg | ORAL_TABLET | Freq: Two times a day (BID) | ORAL | Status: DC
  Administered 2023-06-15 – 2023-06-17 (×4): 0.5 mg via ORAL
  Filled 2023-06-15 (×4): qty 1

## 2023-06-15 MED ORDER — OXYCODONE HCL 5 MG PO TABS
5.0000 mg | ORAL_TABLET | Freq: Four times a day (QID) | ORAL | Status: DC | PRN
  Administered 2023-06-15 – 2023-06-17 (×5): 5 mg via ORAL
  Filled 2023-06-15 (×5): qty 1

## 2023-06-15 MED ORDER — LACTULOSE 20 GM/30ML PO SOLN: 15.0000 mL | Freq: Three times a day (TID) | ORAL | Status: DC

## 2023-06-15 MED ORDER — PANTOPRAZOLE SODIUM 40 MG PO TBEC
40.0000 mg | DELAYED_RELEASE_TABLET | Freq: Every day | ORAL | Status: DC
  Administered 2023-06-15 – 2023-06-17 (×3): 40 mg via ORAL
  Filled 2023-06-15 (×3): qty 1

## 2023-06-15 MED ORDER — VITAMIN E 180 MG (400 UNIT) PO CAPS
400.0000 [IU] | ORAL_CAPSULE | Freq: Every day | ORAL | Status: DC
  Administered 2023-06-16 – 2023-06-17 (×2): 400 [IU] via ORAL
  Filled 2023-06-15 (×2): qty 1

## 2023-06-15 MED ORDER — AMITRIPTYLINE HCL 25 MG PO TABS
25.0000 mg | ORAL_TABLET | Freq: Every day | ORAL | Status: DC
  Administered 2023-06-15 – 2023-06-16 (×2): 25 mg via ORAL
  Filled 2023-06-15 (×2): qty 1

## 2023-06-15 NOTE — ED Notes (Signed)
 Patient reported pain in back of head and in back. Unable to give number rating or describe pain quality. MD was at bedside with this nurse when patient reported this pain.

## 2023-06-15 NOTE — ED Notes (Signed)
 Pt with c/o feeling SOB and can't breathe. O2 Sats obtained, 100%RA. EDP-Joshua Long,MD notified and nothing further needed at this time. Additional PRN Ativan ordered for Anxiety. Will continue to monitor and give prn.

## 2023-06-15 NOTE — ED Provider Notes (Addendum)
 Patient was seen by Dr. Fredderick Phenix initially.  She was medically cleared although was diagnosed with COVID.  Pt is not having any difficulty breathing. Psychiatry has evaluated the patient and indicated she is stable for discharge.  Recommendation is to "increase her klonopin to 0.5mg  po BID; if there are no contraindications she can start gabapentin 100mg  po TID which can help her pain and anxiety. "  I did discuss these recommendations with the patient the psychiatry team did not feel that she needed to be admitted to the hospital.  Patient is comfortable going home   Psychiatry team did attempt to reach the husband but were unable to do so    Linwood Dibbles, MD 06/15/23 1914  Husband called back.  Would like to talk to pyschiatry.  Unable to pick pt up tonight.  Psychiatry team notified.  Can speak with husband in the am    Linwood Dibbles, MD 06/15/23 2102

## 2023-06-15 NOTE — ED Provider Notes (Signed)
 [facility] AT [facility] Provider Note   CSN: [id number] Arrival date & time: 06/15/23  0102     History  Chief Complaint  Patient presents with   IVC    [facility] [place] is a 78 y.o. female.  Patient is a 78 year old female with a history of coronary Webster, [place], [facility] Webster, hypertension, Parkinson's, diabetes.  She has recently been seeing a psychiatrist and was diagnosed with possible early dementia.  She has been having panic attacks and sundowning.  She was last seen on February 13.  At that time her Seroquel was DC'd and she was encouraged to take amitriptyline which she apparently had not been previously taking but was prescribed.  Her Valium was switched to Ativan.  Reportedly this was also changed to Klonopin.  Her behavior has worsened per her husband.  She has been wandering on the house banging on doors and yelling "help me".  In the ED, she is constantly yelling "help me, do not leave me".  She is complaining of a headache and back pain.  She has a diagnosis of chronic pain and says her back hurts all the time.  She is denying any overt suicidal thoughts.       Home Medications [facility] to Admission medications   Medication Sig Start Date End Date Taking? Authorizing Provider  acetaminophen (TYLENOL) 500 MG tablet Take 1 tablet (500 mg total) by mouth 4 (four) times daily. 04/09/23   Arrien, York Ram, MD  allopurinol (ZYLOPRIM) 100 MG tablet Take 1 tablet (100 mg total) by mouth daily. 02/28/23     amitriptyline (ELAVIL) 25 MG tablet Take 1 tablet (25 mg total) by mouth at bedtime. Patient taking differently: Take 25 mg by mouth at bedtime. Need to give one pill at bedtime 01/17/23   Nafziger, Kandee Keen, NP  apixaban (Webster) 5 MG TABS tablet Take 1 tablet (5 mg total) by mouth 2 (two) times daily. 02/21/23   Shirline Frees, NP  b complex vitamins capsule Take 1 capsule by mouth daily.    [provider]  bisacodyl (DULCOLAX) 5 MG EC tablet Take 1 Tablet with each dose pf pain medicine. Patient taking differently: Take 5 mg by mouth daily as needed for mild constipation or moderate constipation. Take 1 Tablet with each dose with pain medicine. 05/15/23   Shirline Frees, NP  Cholecalciferol (D3 PO) Take 1 tablet by mouth daily.    [provider]  clonazePAM (KLONOPIN) 0.5 MG tablet Take 1 tablet (0.5 mg total) by mouth 2 (two) times daily as needed for anxiety. 06/12/23 06/11/24  Arfeen, Phillips Grout, MD  colchicine 0.6 MG tablet Take 1 tablet (0.6 mg total) by mouth daily. as needed for gout Patient taking differently: Take 0.6 mg by mouth daily as needed (gout). 02/28/23     diltiazem (CARTIA XT) 120 MG 24 hr capsule Take 1 capsule (120 mg total) by mouth daily. 02/03/23   Ronney Asters, NP  Insulin NPH, Human,, Isophane, (HUMULIN N KWIKPEN) 100 UNIT/ML Kiwkpen Inject 20 Units into the skin in the morning and at bedtime. Decrease your dose to 10 units every morning, and 7 units every evening still appetite back to normal Patient taking differently: Inject 12-20 Units into the skin in the morning and at bedtime. 20 units in the morning and 12-14 units in the evening 05/21/23   Elgergawy, Leana Roe, MD  Insulin NPH, Human,, Isophane, (HUMULIN N KWIKPEN) 100 UNIT/ML Kiwkpen Inject  20 units into the skin every morning and 16 units into the skin every evening. 06/06/23 08/05/23    insulin regular (HUMULIN R) 100 units/mL injection Please decrease your dose to 5 units 3 times daily before meals until appetite picks up back to normal 05/21/23   Elgergawy, Leana Roe, MD  Lactulose 20 GM/30ML SOLN Take 15 mLs (10 g total) by mouth 3 (three) times daily. 05/29/23   Royanne Foots, DO  LORazepam (ATIVAN) 0.5 MG tablet For panic attacks 06/06/23   Arfeen, Phillips Grout, MD  metFORMIN (GLUCOPHAGE-XR) 500 MG 24 hr tablet Take 1 tablet by mouth 2 times daily 08/29/22     Multiple Vitamin (MULITIVITAMIN WITH  MINERALS) TABS Take 1 tablet by mouth daily.    [provider]  nitroGLYCERIN (NITROSTAT) 0.4 MG SL tablet Place 1 tablet (0.4 mg total) under the tongue every 5 (five) minutes as needed for chest pain. 05/15/23   Ronney Asters, NP  omeprazole (PRILOSEC) 20 MG capsule Take 1 capsule (20 mg total) by mouth daily. 04/04/23   [name], [name], PA-C  ondansetron (ZOFRAN) 4 MG tablet Take 1 tablet (4 mg total) by mouth every 8 (eight) hours as needed for nausea or vomiting. 04/04/23   [name], [name], PA-C  oxyCODONE (OXY IR/ROXICODONE) 5 MG immediate release tablet Take 5 mg by mouth every 6 (six) hours as needed for severe pain (pain score 7-10).    [provider]  polyethylene glycol (MIRALAX) 17 g packet Take 17 g by mouth daily as needed. 12/19/22   [name], Willaim Rayas, MD  potassium chloride (KLOR-CON M) 10 MEQ tablet Take 1 tablet (10 mEq total) by mouth daily. Take in concurrence with torsemide. 06/11/23 09/09/23  Nafziger, Kandee Keen, NP  spironolactone (ALDACTONE) 25 MG tablet Take 0.5 tablets (12.5 mg total) by mouth daily. 05/24/23 06/23/23  Elgergawy, Leana Roe, MD  SYNTHROID 137 MCG tablet Take 1 tablet (137 mcg total) by mouth every morning on empty stomach. 01/24/23     tiZANidine (ZANAFLEX) 4 MG tablet Take 1 tablet (4 mg total) by mouth 3 (three) times daily. Patient taking differently: Take 4 mg by mouth daily as needed for muscle spasms. 01/09/23     torsemide (DEMADEX) 20 MG tablet Take 2 tablets (40 mg total) by mouth daily. 05/24/23   Elgergawy, Leana Roe, MD  Vibegron (GEMTESA) 75 MG TABS Take 75 mg by mouth at bedtime.    [provider]  vitamin E 180 MG (400 UNITS) capsule Take 400 Units by mouth daily.    [provider]      Allergies    Statins    Review of Systems   Review of Systems  Constitutional:  Negative for chills, diaphoresis, fatigue and fever.  HENT:  Negative for congestion, rhinorrhea and sneezing.   Eyes: Negative.   Respiratory:   Negative for cough, chest tightness and shortness of breath.   Cardiovascular:  Negative for chest pain and leg swelling.  Gastrointestinal:  Negative for abdominal pain, blood in stool, diarrhea, nausea and vomiting.  Genitourinary:  Negative for difficulty urinating, flank pain, frequency and hematuria.  Musculoskeletal:  Positive for arthralgias and back pain.  Skin:  Negative for rash.  Neurological:  Positive for headaches. Negative for dizziness, speech difficulty, weakness and numbness.    Physical Exam Updated Vital Signs BP (!) 159/86 (BP Location: Right Arm)   Pulse 80   Temp 98.6 F (37 C) (Oral)   Resp 17   SpO2 100%  Physical Exam Constitutional:      Appearance: She is well-developed.  HENT:     Head: Normocephalic and atraumatic.  Eyes:     Pupils: Pupils are equal, round, and reactive to light.  Cardiovascular:     Rate and Rhythm: Normal rate and regular rhythm.     Heart sounds: Normal heart sounds.  Pulmonary:     Effort: Pulmonary effort is normal. No respiratory distress.     Breath sounds: Normal breath sounds. No wheezing or rales.  Chest:     Chest wall: No tenderness.  Abdominal:     General: Bowel sounds are normal.     Palpations: Abdomen is soft.     Tenderness: There is no abdominal tenderness. There is no guarding or rebound.  Musculoskeletal:        General: Normal range of motion.     Cervical back: Normal range of motion and neck supple.  Lymphadenopathy:     Cervical: No cervical adenopathy.  Skin:    General: Skin is warm and dry.     Findings: No rash.  Neurological:     General: No focal deficit present.     Mental Status: She is alert and oriented to person, place, and time.  Psychiatric:     Comments: Very agitated     ED Results / Procedures / Treatments   Labs (all labs ordered are listed, but only abnormal results are displayed) Labs Reviewed  RESP PANEL BY RT-PCR (RSV, FLU A&B, COVID)  [id number] - Abnormal; Notable for  the following components:      Result Value   SARS Coronavirus 2 by RT PCR POSITIVE (*)    All other components within normal limits  COMPREHENSIVE METABOLIC PANEL - Abnormal; Notable for the following components:   CO2 15 (*)    Glucose, Bld 157 (*)    Calcium 10.5 (*)    Total Bilirubin 1.3 (*)    All other components within normal limits  RAPID URINE DRUG SCREEN, HOSP PERFORMED - Abnormal; Notable for the following components:   Benzodiazepines POSITIVE (*)    All other components within normal limits  CBC WITH DIFFERENTIAL/PLATELET - Abnormal; Notable for the following components:   WBC 13.2 (*)    RBC 5.55 (*)    Hemoglobin 10.1 (*)    HCT 32.3 (*)    MCV 58.2 (*)    MCH 18.2 (*)    RDW 19.9 (*)    Neutro Abs 9.8 (*)    All other components within normal limits  URINALYSIS, ROUTINE W REFLEX MICROSCOPIC - Abnormal; Notable for the following components:   Color, Urine STRAW (*)    All other components within normal limits  ETHANOL    EKG None  Radiology CT Head Wo Contrast Result Date: 06/15/2023 CLINICAL DATA:  Mental status change, unknown cause EXAM: CT HEAD WITHOUT CONTRAST TECHNIQUE: Contiguous axial images were obtained from the base of the skull through the vertex without intravenous contrast. RADIATION DOSE REDUCTION: This exam was performed according to the departmental dose-optimization program which includes automated exposure control, adjustment of the mA and/or kV according to patient size and/or use of iterative reconstruction technique. COMPARISON:  CT head May 27, 2022. FINDINGS: Brain: No evidence of acute infarction, hemorrhage, hydrocephalus, extra-axial collection or mass lesion/mass effect. Remote left cerebellar infarct. [place] left thalamic infarct not well visualized. Vascular: No hyperdense vessel. Skull: No acute fracture. Sinuses/Orbits: Clear sinuses.  No acute orbital findings. Other: No mastoid effusions. IMPRESSION: Stable head  CT.  No evidence  of acute abnormality. Electronically Signed   By: Feliberto Harts M.D.   On: 06/15/2023 11:13    Procedures Procedures    Medications Ordered in ED Medications  LORazepam (ATIVAN) injection 2 mg (2 mg Intramuscular Given 06/15/23 0918)  haloperidol lactate (HALDOL) injection 2 mg (2 mg Intramuscular Given 06/15/23 1128)    ED Course/ Medical Decision Making/ A&P                                 Medical Decision Making Amount and/or Complexity of Data Reviewed Labs: ordered. Radiology: ordered.  Risk Prescription drug management.   Patient is a 78 year old who presents under IVC for psychiatric assessment.  She has had worsening anxiety.  She does appear very anxious here in the ED.  Was given some Ativan and then some Haldol with improvement.  Labs are reviewed.  Her COVID test is positive.  She does not have any significant coughing or hypoxia.  No fever.  Her white count is just a little bit elevated.  Urine is not consistent with infection.  Otherwise her labs are nonconcerning.  CT shows no acute abnormality.  She is medically cleared and awaiting TTS evaluation.  Final Clinical Impression(s) / ED Diagnoses Final diagnoses:  Anxiety  COVID-19 virus infection    Rx / DC Orders ED Discharge Orders     None         Rolan Bucco, MD 06/15/23 1520

## 2023-06-15 NOTE — ED Notes (Signed)
 This nurse notified CT that patient can be taken to imaging at this time. Nurse did notify of covid positive status as well.

## 2023-06-15 NOTE — Consult Note (Signed)
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 Rfl: 0   Vibegron (GEMTESA) 75 MG TABS, Take 75 mg by mouth at bedtime., Disp: , Rfl:    vitamin E 180 MG (400 UNITS) capsule, Take 400 Units by mouth daily., Disp: , Rfl:    bisacodyl (DULCOLAX) 5 MG EC tablet, Take 1 Tablet with each dose pf pain medicine. (Patient not taking: Reported on 06/15/2023), Disp: 100 tablet, Rfl: 1   Insulin NPH, Human,, Isophane, (HUMULIN N KWIKPEN) 100 UNIT/ML Kiwkpen, Inject 20 units into the skin every morning and 16 units into the skin every evening. (Patient not taking: Reported on 06/15/2023), Disp: 10 mL, Rfl: 0   Lactulose 20 GM/30ML SOLN, Take 15 mLs (10 g total) by mouth 3 (three) times daily. (Patient not taking: Reported on 06/15/2023), Disp: 1350 mL, Rfl: 0   LORazepam (ATIVAN) 0.5 MG tablet, For panic attacks (Patient not taking: Reported on 06/15/2023), Disp: 20 tablet, Rfl: 0   ondansetron (ZOFRAN) 4 MG tablet, Take 1 tablet (4 mg total) by mouth every 8 (eight) hours as needed for nausea or vomiting. (Patient not taking: Reported on 06/15/2023), Disp: 40 tablet, Rfl: 1  Allergies: Allergies   Allergen Reactions   Statins Other (See Comments)    Tried simvastatin, atorvastatin; on Repatha    Chales Abrahams, NP

## 2023-06-15 NOTE — ED Notes (Signed)
 Patient in bed with eyes closed. No acute distress. Appears to be sleeping. Chest rise and fall noted. Nurse still awaiting pharmacy to verify medications.

## 2023-06-15 NOTE — Discharge Instructions (Addendum)
 Please follow-up with the recommendations provided by the psychiatry team.  Recommendation is to increase your Klonopin and to start taking the Neurontin.  Follow-up with your primary care and psychiatry doctor for further treatment.

## 2023-06-15 NOTE — ED Notes (Signed)
 Patient on Kosher diet, says she cannot eat the beef on spaghetti sent for dinner. This nurse called dining services and requested new tray with spaghetti and sauce only with no meat, dining services to send tray up and recommend adding beef to allergies to prevent patient from getting these foods on trays.

## 2023-06-15 NOTE — ED Notes (Signed)
 Patient continues to yell loudly "help me. Don't leave me". Staff assured patient they are not leaving and are here to assist patient. Patient continues to attempt to get out of bed.

## 2023-06-15 NOTE — ED Triage Notes (Signed)
 Patient Breanna Webster under IVC Patient has h/o anxiety, recently had medication change from ativan to klonopin Webster reports psych provider says patient has early onset dementia Patient presents to ED yelling "please. You said you wouldn't leave me" IVC initiated by husband of patient

## 2023-06-16 DIAGNOSIS — U071 COVID-19: Secondary | ICD-10-CM | POA: Diagnosis not present

## 2023-06-16 DIAGNOSIS — F419 Anxiety disorder, unspecified: Secondary | ICD-10-CM | POA: Diagnosis not present

## 2023-06-16 DIAGNOSIS — F410 Panic disorder [episodic paroxysmal anxiety] without agoraphobia: Secondary | ICD-10-CM | POA: Diagnosis not present

## 2023-06-16 LAB — CBG MONITORING, ED
Glucose-Capillary: 96 mg/dL (ref 70–99)
Glucose-Capillary: 188 mg/dL — ABNORMAL HIGH (ref 70–99)

## 2023-06-16 NOTE — ED Provider Notes (Signed)
 Emergency Medicine Observation Re-evaluation Note  Breanna Webster is a 78 y.o. female, seen on rounds today.  Pt initially presented to the ED for complaints of IVC Currently, the patient is sleeping.  Physical Exam  BP 134/73 (BP Location: Right Arm)   Pulse 93   Temp 97.7 F (36.5 C) (Oral)   Resp 18   SpO2 100%  Physical Exam General: No acute distress Cardiac: Normal rate Lungs: No increased work of breathing Psych: Calm  ED Course / MDM  EKG:   I have reviewed the labs performed to date as well as medications administered while in observation.  Recent changes in the last 24 hours include [facility] evaluation.  Plan  Current plan is for patient has been psychiatrically cleared.  Has been reportedly wants to talk to the psych team prior to picking the patient up and per notes, this should happen this morning.    Rolan Bucco, MD 06/16/23 [phone]

## 2023-06-16 NOTE — ED Notes (Signed)
 Patient agitated and crying out, stating her back is hurting, rating 10. Wants meds for pain.

## 2023-06-16 NOTE — ED Notes (Signed)
 This RN reached out to Ophelia Shoulder, Psych NP via chat message asking if she could reach out to Pt's Husband d/t him requesting to speak with the psychiatrist who assessed his wife. Per Mamie Nick she tried to give husband a call several times with no answer. She also endorses that she had been off since 1900 but will be back in on tomorrow and will attempt once again to try and contact Pt's husband.

## 2023-06-16 NOTE — ED Notes (Signed)
 This RN attempted to give Pt's husband a f/u call to make him aware that the Psychiatrist will attempt to give him a call tomorrow with the updated POC but phone is going straight to voicemail and mailbox is currently full. Attempts made x3 back to back.

## 2023-06-16 NOTE — Consult Note (Signed)
 Reached patient's spouse, Jamesyn Moorefield @[phone]. He was informed his wife's anxiety is currently managed and no concerns noted over night. He reports her concerns started one year ago s/p shoulder surgery.  He states since last year, he has been overwhelmed in caring for her.  He reports psychological and physical strain in being the primary caregiver for his wife.  He reports personal health concerns that limits his own functional capacity. He states he has lost about 23 pounds in the past year trying to care for his himself and his wife.  He states he has 2 sons, neither live local nor are unable to help right now.  He reports his wife has physical therapy services via [facility] that comes once a week; he would like to have assistance with ADLs and the ability to run errands, pick up food, etc.,  He states he's a retired Runner, broadcasting/film/video and cannot afford to pay out of pocket for home health services.  He also spoke with social work and will await additional resources via my chart and discharge AVS.  Verbal support and encouragement offered.  Mr. Hasan verbalized his appreciation for the call and time spent communication patient's plan of care. We discussed treatment plans/next steps in her care as outlined below. He verbalized his love for his spouse and states he's not interested in SNF or memory care unit, rather desires assistance in keeping her in their own and allow for some level of continued independence.   From a psychiatric standing we discussed the following recommendations: Increase klonopin to 0.5mg  po BID for anxiety; spouse said she has 0.25mg  pills and he can give 2 tabs BID for now.  Did not want another rx for now.  Can follow up with Dr. Lolly Mustache, psychiatry within the next 72 hours of discharge for additional medication management.  If no contraindications and klonopin does not improve anxiety, may consider gabapentin 100mg  po TID for pain and anxiety-but will defer for now.  Per SW  report, there are no safety concerns that warrant APS services. SW to assist with getting home health aide services set up for ADL assistance.  Per SW medicare only allows for one-hour services 2-3x weekly.  The patient would be responsible for additional services beyond that.  Recommend she continue follow up with PCP/pain management regarding chronic pain Follow with neurology re Parkinsons's for next steps in treatment planning.   25 minutes was spent via telephone call with her spouse, gaining collateral and communicating patient treatment plans.
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
## 2023-06-16 NOTE — Consult Note (Signed)
 2 unsuccessful attempts to reach patient's spouse, Breanna Webster at [phone] to gain collateral.  Nursing staff spoke with Breanna Webster last night and informed him of my attempts to reach him.  Historically he was also advised to answer his phone if he receives unknown call as it could be hospital staff.    I did leave a message for him to return the call to nursing staff.

## 2023-06-16 NOTE — ED Notes (Signed)
 Pt resting. NAD and chest rise and fall noted.Call bell remains in reach.

## 2023-06-17 ENCOUNTER — Other Ambulatory Visit (HOSPITAL_BASED_OUTPATIENT_CLINIC_OR_DEPARTMENT_OTHER): Payer: Self-pay

## 2023-06-17 ENCOUNTER — Telehealth: Payer: Self-pay

## 2023-06-17 DIAGNOSIS — U071 COVID-19: Secondary | ICD-10-CM | POA: Diagnosis not present

## 2023-06-17 DIAGNOSIS — F410 Panic disorder [episodic paroxysmal anxiety] without agoraphobia: Secondary | ICD-10-CM | POA: Diagnosis not present

## 2023-06-17 DIAGNOSIS — F419 Anxiety disorder, unspecified: Secondary | ICD-10-CM | POA: Diagnosis not present

## 2023-06-17 NOTE — ED Notes (Signed)
 Contacted husband and was notified that his wife was being discharged    he said he would be here when he could get here that he has things to take care of first

## 2023-06-17 NOTE — Telephone Encounter (Signed)
 Received secure chat from [place], [place] who reports patient's husband called requesting a gait belt.  This RNCM spoke with Cindie with [facility] who is following patient in the community for HHPT/OT, HHA, SW services. Cindie will notify the HHPT to follow up with the patient and her husband re: gait belt.

## 2023-06-17 NOTE — Telephone Encounter (Signed)
 Copied from CRM [id number]. Topic: Clinical - Home Health Verbal Orders >> Jun 17, 2023  4:35 PM Myrtice Lauth wrote: Caller/Agency: [facility] Callback Number: [phone] Service Requested: Physical Therapy and Skilled Nursing Frequency:  Any new concerns about the patient? Yes Called to notify provider of Discharge from services. States pt requires a higher level of care. Pt recently IVC, pt has unmanageable  anxiety, husband refused services. Nurse is requesting a call back

## 2023-06-17 NOTE — ED Notes (Signed)
 Order for free T4 and TSH from 06/15/23 found incomplete, Dr Madilyn Hook made aware , ok to obtain with AM labs.

## 2023-06-17 NOTE — ED Provider Notes (Signed)
  Physical Exam  BP 116/64 (BP Location: Right Arm)   Pulse 79   Temp (!) 97.3 F (36.3 C) (Oral)   Resp 16   SpO2 99%   Physical Exam Vitals reviewed.  Cardiovascular:     Rate and Rhythm: Normal rate.  Abdominal:     General: Abdomen is flat.  Musculoskeletal:        General: Normal range of motion.  Neurological:     General: No focal deficit present.     Procedures  Procedures  ED Course / MDM    Medical Decision Making 78 year old female here today as an IVC from family.  Has been cleared by psychiatry.  They have made medication changes, discussed this with patient's husband.  Patient medically cleared, IVC rescinded, will discharge back home.  Amount and/or Complexity of Data Reviewed Labs: ordered. Radiology: ordered.  Risk OTC drugs. Prescription drug management.          Anders Simmonds T, DO 06/17/23 [phone]

## 2023-06-17 NOTE — ED Notes (Signed)
 Pt sat on beside commode x 25 min w/o result . Pt passing flatus and voiding greater than 250 ml but no stool noted after 3 attempts. Pt yells repeatedly "help me" during each episode.

## 2023-06-17 NOTE — Telephone Encounter (Signed)
 Left voicemail message for patient and husband re: gait belt. Breanna Webster [facility] will follow up with the patient/ husband Breanna Webster.

## 2023-06-17 NOTE — ED Notes (Signed)
 Patient complaining her back is hurting

## 2023-06-17 NOTE — Telephone Encounter (Signed)
 Made another attempt to speak with patient's husband WU:[id number] belt. The gentleman that answered indicated Breanna Webster was on his way to the hospital to pick up a giat belt. This RNCM advised of message that has been left for patient/ patient husband to advise the HHPT department will assist with this. The person that answered indicated they don't think he checked his messages.

## 2023-06-17 NOTE — ED Notes (Signed)
 Breanna Webster is currently displaying somatic c/o SOB lung sounds are clear all fields and vital sounds on RA are well within normal limits. Will provide medications for anxiety ASAP.

## 2023-06-17 NOTE — Progress Notes (Signed)
 CSW attempted to reach patient's husband on mobile and home phone numbers without success - a voicemail was let requesting a return call.  Edwin Dada, MSW, LCSW Transitions of Care  Clinical Social Worker II [phone]

## 2023-06-18 ENCOUNTER — Emergency Department (HOSPITAL_COMMUNITY)
Admission: EM | Admit: 2023-06-18 | Discharge: 2023-06-26 | Disposition: A | Discharge status: Skilled Nursing Facility | Payer: Medicare Other | Attending: Emergency Medicine | Admitting: Emergency Medicine

## 2023-06-18 ENCOUNTER — Other Ambulatory Visit: Payer: Self-pay | Admitting: Adult Health

## 2023-06-18 ENCOUNTER — Other Ambulatory Visit: Payer: Self-pay

## 2023-06-18 ENCOUNTER — Emergency Department (HOSPITAL_COMMUNITY): Payer: Medicare Other

## 2023-06-18 ENCOUNTER — Encounter (HOSPITAL_COMMUNITY): Payer: Self-pay

## 2023-06-18 ENCOUNTER — Telehealth: Payer: Self-pay | Admitting: Adult Health

## 2023-06-18 DIAGNOSIS — S34139A Unspecified injury to sacral spinal cord, initial encounter: Secondary | ICD-10-CM | POA: Insufficient documentation

## 2023-06-18 DIAGNOSIS — S199XXA Unspecified injury of neck, initial encounter: Secondary | ICD-10-CM | POA: Diagnosis not present

## 2023-06-18 DIAGNOSIS — R0602 Shortness of breath: Secondary | ICD-10-CM | POA: Insufficient documentation

## 2023-06-18 DIAGNOSIS — W19XXXA Unspecified fall, initial encounter: Secondary | ICD-10-CM

## 2023-06-18 DIAGNOSIS — Z043 Encounter for examination and observation following other accident: Secondary | ICD-10-CM | POA: Diagnosis not present

## 2023-06-18 DIAGNOSIS — W08XXXA Fall from other furniture, initial encounter: Secondary | ICD-10-CM | POA: Insufficient documentation

## 2023-06-18 DIAGNOSIS — F419 Anxiety disorder, unspecified: Secondary | ICD-10-CM

## 2023-06-18 DIAGNOSIS — Z7901 Long term (current) use of anticoagulants: Secondary | ICD-10-CM | POA: Insufficient documentation

## 2023-06-18 DIAGNOSIS — R000 Tachycardia, unspecified: Secondary | ICD-10-CM | POA: Diagnosis not present

## 2023-06-18 DIAGNOSIS — I700 Atherosclerosis of aorta: Secondary | ICD-10-CM | POA: Diagnosis not present

## 2023-06-18 DIAGNOSIS — M1711 Unilateral primary osteoarthritis, right knee: Secondary | ICD-10-CM | POA: Diagnosis not present

## 2023-06-18 DIAGNOSIS — R296 Repeated falls: Secondary | ICD-10-CM | POA: Diagnosis not present

## 2023-06-18 DIAGNOSIS — I447 Left bundle-branch block, unspecified: Secondary | ICD-10-CM | POA: Diagnosis not present

## 2023-06-18 DIAGNOSIS — Z952 Presence of prosthetic heart valve: Secondary | ICD-10-CM

## 2023-06-18 DIAGNOSIS — R0689 Other abnormalities of breathing: Secondary | ICD-10-CM | POA: Diagnosis not present

## 2023-06-18 DIAGNOSIS — I5032 Chronic diastolic (congestive) heart failure: Secondary | ICD-10-CM

## 2023-06-18 DIAGNOSIS — M47812 Spondylosis without myelopathy or radiculopathy, cervical region: Secondary | ICD-10-CM | POA: Diagnosis not present

## 2023-06-18 DIAGNOSIS — S3992XA Unspecified injury of lower back, initial encounter: Secondary | ICD-10-CM | POA: Diagnosis not present

## 2023-06-18 DIAGNOSIS — R410 Disorientation, unspecified: Secondary | ICD-10-CM | POA: Diagnosis not present

## 2023-06-18 DIAGNOSIS — R531 Weakness: Secondary | ICD-10-CM | POA: Insufficient documentation

## 2023-06-18 DIAGNOSIS — R2689 Other abnormalities of gait and mobility: Secondary | ICD-10-CM | POA: Diagnosis not present

## 2023-06-18 DIAGNOSIS — I10 Essential (primary) hypertension: Secondary | ICD-10-CM | POA: Diagnosis not present

## 2023-06-18 DIAGNOSIS — R262 Difficulty in walking, not elsewhere classified: Secondary | ICD-10-CM

## 2023-06-18 DIAGNOSIS — I6523 Occlusion and stenosis of bilateral carotid arteries: Secondary | ICD-10-CM | POA: Diagnosis not present

## 2023-06-18 DIAGNOSIS — M51369 Other intervertebral disc degeneration, lumbar region without mention of lumbar back pain or lower extremity pain: Secondary | ICD-10-CM | POA: Diagnosis not present

## 2023-06-18 DIAGNOSIS — M47816 Spondylosis without myelopathy or radiculopathy, lumbar region: Secondary | ICD-10-CM | POA: Diagnosis not present

## 2023-06-18 DIAGNOSIS — R0902 Hypoxemia: Secondary | ICD-10-CM | POA: Diagnosis not present

## 2023-06-18 DIAGNOSIS — S0990XA Unspecified injury of head, initial encounter: Secondary | ICD-10-CM | POA: Diagnosis not present

## 2023-06-18 LAB — CBG MONITORING, ED: Glucose-Capillary: 196 mg/dL — ABNORMAL HIGH (ref 70–99)

## 2023-06-18 LAB — CBC WITH DIFFERENTIAL/PLATELET
Abs Immature Granulocytes: 0 10*3/uL (ref 0.00–0.07)
Basophils Absolute: 0.2 10*3/uL — ABNORMAL HIGH (ref 0.0–0.1)
Basophils Relative: 2 %
Eosinophils Absolute: 0 10*3/uL (ref 0.0–0.5)
Eosinophils Relative: 0 %
HCT: 33.5 % — ABNORMAL LOW (ref 36.0–46.0)
Hemoglobin: 10.6 g/dL — ABNORMAL LOW (ref 12.0–15.0)
Lymphocytes Relative: 13 %
Lymphs Abs: 1.4 10*3/uL (ref 0.7–4.0)
MCH: 18.3 pg — ABNORMAL LOW (ref 26.0–34.0)
MCHC: 31.6 g/dL (ref 30.0–36.0)
MCV: 57.8 fL — ABNORMAL LOW (ref 80.0–100.0)
Monocytes Absolute: 0.6 10*3/uL (ref 0.1–1.0)
Monocytes Relative: 5 %
Neutro Abs: 8.8 10*3/uL — ABNORMAL HIGH (ref 1.7–7.7)
Neutrophils Relative %: 80 %
Platelets: 179 10*3/uL (ref 150–400)
RBC: 5.8 MIL/uL — ABNORMAL HIGH (ref 3.87–5.11)
RDW: 21 % — ABNORMAL HIGH (ref 11.5–15.5)
WBC: 11 10*3/uL — ABNORMAL HIGH (ref 4.0–10.5)
nRBC: 0.3 % — ABNORMAL HIGH (ref 0.0–0.2)

## 2023-06-18 LAB — TROPONIN I (HIGH SENSITIVITY)
Troponin I (High Sensitivity): 94 ng/L — ABNORMAL HIGH (ref <18)
Troponin I (High Sensitivity): 86 ng/L — ABNORMAL HIGH (ref <18)

## 2023-06-18 LAB — COMPREHENSIVE METABOLIC PANEL
ALT: 22 U/L (ref 0–44)
AST: 34 U/L (ref 15–41)
Albumin: 4.1 g/dL (ref 3.5–5.0)
Alkaline Phosphatase: 65 U/L (ref 38–126)
Anion gap: 17 — ABNORMAL HIGH (ref 5–15)
BUN: 22 mg/dL (ref 8–23)
CO2: 15 mmol/L — ABNORMAL LOW (ref 22–32)
Calcium: 10.5 mg/dL — ABNORMAL HIGH (ref 8.9–10.3)
Chloride: 104 mmol/L (ref 98–111)
Creatinine, Ser: 1.06 mg/dL — ABNORMAL HIGH (ref 0.44–1.00)
GFR, Estimated: 53 mL/min — ABNORMAL LOW (ref >60)
Glucose, Bld: 221 mg/dL — ABNORMAL HIGH (ref 70–99)
Potassium: 3.7 mmol/L (ref 3.5–5.1)
Sodium: 136 mmol/L (ref 135–145)
Total Bilirubin: 1.5 mg/dL — ABNORMAL HIGH (ref 0.0–1.2)
Total Protein: 7.5 g/dL (ref 6.5–8.1)

## 2023-06-18 LAB — BRAIN NATRIURETIC PEPTIDE: B Natriuretic Peptide: 401.1 pg/mL — ABNORMAL HIGH (ref 0.0–100.0)

## 2023-06-18 MED ORDER — LORAZEPAM 2 MG/ML IJ SOLN
1.0000 mg | Freq: Once | INTRAMUSCULAR | Status: AC
  Administered 2023-06-18: 1 mg via INTRAVENOUS
  Filled 2023-06-18: qty 1

## 2023-06-18 MED ORDER — HALOPERIDOL LACTATE 5 MG/ML IJ SOLN
5.0000 mg | Freq: Once | INTRAMUSCULAR | Status: AC
  Administered 2023-06-18: 5 mg via INTRAVENOUS
  Filled 2023-06-18: qty 1

## 2023-06-18 MED ORDER — ACETAMINOPHEN 325 MG PO TABS
650.0000 mg | ORAL_TABLET | ORAL | Status: DC | PRN
  Administered 2023-06-20 – 2023-06-25 (×5): 650 mg via ORAL
  Filled 2023-06-18 (×5): qty 2

## 2023-06-18 MED ORDER — HYDROCODONE-ACETAMINOPHEN 5-325 MG PO TABS
1.0000 | ORAL_TABLET | Freq: Once | ORAL | Status: AC
  Administered 2023-06-18: 1 via ORAL
  Filled 2023-06-18: qty 1

## 2023-06-18 NOTE — ED Notes (Signed)
Pt is now axox4. 

## 2023-06-18 NOTE — ED Provider Notes (Signed)
 [place] EMERGENCY DEPARTMENT AT [facility] Provider Note   CSN: [id number] Arrival date & time: 06/18/23  4098     History Chief Complaint  Patient presents with   Fall    HPI Breanna Webster is a 78 y.o. female presenting for chief complaint of fall off the couch. She refuses to sleep on the bed. Very confused and wandering around the house all night. Husband is saying he cannot take care of her. Screaming from 6pm to 4am.  EMS out 5 times yesterday. Progressive over 6 months no acute changes.  Patient's recorded medical, surgical, social, medication list and allergies were reviewed in the Snapshot window as part of the initial history.   Review of Systems   Review of Systems  Constitutional:  Negative for chills and fever.  HENT:  Negative for ear pain and sore throat.   Eyes:  Negative for pain and visual disturbance.  Respiratory:  Negative for cough and shortness of breath.   Cardiovascular:  Negative for chest pain and palpitations.  Gastrointestinal:  Negative for abdominal pain and vomiting.  Genitourinary:  Negative for dysuria and hematuria.  Musculoskeletal:  Negative for arthralgias and back pain.  Skin:  Negative for color change and rash.  Neurological:  Positive for dizziness and weakness. Negative for seizures and syncope.  All other systems reviewed and are negative.   Physical Exam Updated Vital Signs BP 136/77   Pulse (!) 114   Temp 98.1 F (36.7 C) (Tympanic)   Resp (!) 29   Ht 5\' 6"  (1.676 m)   Wt 72.6 kg   SpO2 100%   BMI 25.82 kg/m  Physical Exam Vitals and nursing note reviewed.  Constitutional:      General: She is not in acute distress.    Appearance: She is well-developed.  HENT:     Head: Normocephalic and atraumatic.  Eyes:     Conjunctiva/sclera: Conjunctivae normal.  Cardiovascular:     Rate and Rhythm: Normal rate and regular rhythm.     Heart sounds: No murmur heard. Pulmonary:     Effort: Pulmonary effort is  normal. No respiratory distress.     Breath sounds: Normal breath sounds.  Abdominal:     General: There is no distension.     Palpations: Abdomen is soft.     Tenderness: There is no abdominal tenderness. There is no right CVA tenderness or left CVA tenderness.  Musculoskeletal:        General: No swelling or tenderness. Normal range of motion.     Cervical back: Neck supple.  Skin:    General: Skin is warm and dry.  Neurological:     General: No focal deficit present.     Mental Status: She is alert and oriented to person, place, and time. Mental status is at baseline.     Cranial Nerves: No cranial nerve deficit.      ED Course/ Medical Decision Making/ A&P    Procedures Procedures   Medications Ordered in ED Medications  acetaminophen (TYLENOL) tablet 650 mg (has no administration in time range)  HYDROcodone-acetaminophen (NORCO/VICODIN) 5-325 MG per tablet 1 tablet (1 tablet Oral Given 06/18/23 1319)  LORazepam (ATIVAN) injection 1 mg (1 mg Intravenous Given 06/18/23 1715)  haloperidol lactate (HALDOL) injection 5 mg (5 mg Intravenous Given 06/18/23 1748)    Medical Decision Making:   78 year old female presenting after ground-level fall. Long conversation with patient's husband as above. He states that she fell 4 times overnight.  EMS had to be called out 5 times.  She has had progressive sundowning.  He is 80 years old and a struggling take care of her at home. She was seen last week for similar for psychiatric care she started on new medications but it does not seem to be helping. She was complaining that she could not breathe and felt bad last night. Today, she just repeats on loop that she does not feel well and is hurting all over.  She will not answer any direct questions. Her history of present illness physical exam findings are most consistent with developing dementia with worsening cognitive impairment, delirium of uncertain etiology. Considered infectious  pathology such as UTI or pneumonia, metabolic crisis, hematologic crisis as etiology.  Will evaluate appropriately with studies as ordered. Additionally will evaluate for acute cardiac etiology of her reported shortness of breath with serial troponins and EKG.  She has some underlying cardiac disease.  Initial troponin mildly elevated follow-up pending. Given her reported pain will evaluate for traumatic injury with CT head, CT L-spine, CT C-spine and knee x-ray given her reported locations of pain.  These resulted negative for any acute pathology.  Reassessment: Ultimately patient's workup does not reveal any acute medical concerns. This was anticipated given her history of present illness and physical exam findings.  She required multiple medications to tolerate any intervention in the emergency room secondary to the degree of her delirium.  This may be from multiple nights without sleep, worsening cognitive impairment or hospital-acquired delirium. Ultimately, patient's husband is struggling take care of her at home due to the degree of her disease process.  She does not have any acute medical crisis warranting admission to the hospital at the time of this documentation.  Social work consulted to assist with home health and safe disposition determination. Family has adamantly stated they do not want patient placed in a skilled nursing environment at this time if at all avoidable. Patient placed in the social work boarding protocol pending safe disposition determination.  Clinical Impression:  1. Fall, initial encounter      Data Unavailable   Final Clinical Impression(s) / ED Diagnoses Final diagnoses:  Fall, initial encounter    Rx / DC Orders ED Discharge Orders     None         Glyn Ade, MD 06/18/23 2232

## 2023-06-18 NOTE — ED Provider Triage Note (Signed)
 Emergency Medicine Provider Triage Evaluation Note  Breanna Webster , a 78 y.o. female  was evaluated in triage.  Pt complains of fall.  Reports multiple falls in the past 3 days.  She is on Eliquis.  Most recent fall was this morning.  States she hit her right knee and landed on her buttock.  States she may have hit her head.  She denies any head or neck pain.  Patient is on Eliquis.  States she tripped and fell in her room.  Denies any associated chest pain, arrhythmia, abdominal pain, or shortness of breath.  Reports she does have an oxygen requirement at home.  Review of Systems  Positive: See above Negative: See above  Physical Exam  BP (!) 142/75 (BP Location: Right Arm)   Pulse 94   Temp 98.7 F (37.1 C) (Oral)   Resp 20   Ht 5\' 6"  (1.676 m)   Wt 72.6 kg   SpO2 100%   BMI 25.82 kg/m  Gen:   Awake, no distress   Resp:  Normal effort  MSK:   Moves extremities without difficulty  Other:    Medical Decision Making  Medically screening exam initiated at 9:22 AM.  Appropriate orders placed.  Torian Thoennes Favela was informed that the remainder of the evaluation will be completed by another provider, this initial triage assessment does not replace that evaluation, and the importance of remaining in the ED until their evaluation is complete.  Work up started   Gareth Eagle, [place] 06/18/23 [phone]

## 2023-06-18 NOTE — Telephone Encounter (Signed)
**Note De-identified  [place] Obfuscation** Please advise 

## 2023-06-18 NOTE — Progress Notes (Signed)
 CSW received call from patient's husband [name] who states he is interested in patient receiving palliative care services in the home. [name] states his preference for agency is [facility] for palliative services. [name] states he does not want his wife placed in a facility but wants her home with supportive services.  CSW spoke with Norm Parcel at [facility] to make referral - CSW sent secure chat so clinicals can be reviewed.  Edwin Dada, MSW, LCSW Transitions of Care  Clinical Social Worker II [phone]

## 2023-06-18 NOTE — ED Triage Notes (Signed)
 Pt BIB EMS from home. C/O fall this morning. Pt landed on sacral region. Frequent falls in past 3 days, on elliquis but denies hitting head. Polyuria, dysuria and has been on abx.

## 2023-06-18 NOTE — Telephone Encounter (Signed)
 Pt's husband came in office, he states his wife needs inpatient palliative care and would like a call back from care team to discuss further. I offered to make an appt, however he states pt is currently in the hospital and needs Kandee Keen to send in the referral for palliative care.   Pt's husband dropped off information for palliatve care, left in providers folder at front.   Please advise.

## 2023-06-19 ENCOUNTER — Ambulatory Visit (HOSPITAL_COMMUNITY): Payer: PRIVATE HEALTH INSURANCE | Admitting: Licensed Clinical Social Worker

## 2023-06-19 DIAGNOSIS — S34139A Unspecified injury to sacral spinal cord, initial encounter: Secondary | ICD-10-CM | POA: Diagnosis not present

## 2023-06-19 MED ORDER — ALLOPURINOL 100 MG PO TABS
100.0000 mg | ORAL_TABLET | Freq: Every day | ORAL | Status: DC
  Administered 2023-06-19 – 2023-06-26 (×8): 100 mg via ORAL
  Filled 2023-06-19 (×8): qty 1

## 2023-06-19 MED ORDER — APIXABAN 5 MG PO TABS
5.0000 mg | ORAL_TABLET | Freq: Two times a day (BID) | ORAL | Status: DC
  Administered 2023-06-19 – 2023-06-26 (×15): 5 mg via ORAL
  Filled 2023-06-19 (×2): qty 1
  Filled 2023-06-19: qty 2
  Filled 2023-06-19 (×3): qty 1
  Filled 2023-06-19: qty 2
  Filled 2023-06-19 (×3): qty 1
  Filled 2023-06-19: qty 2
  Filled 2023-06-19 (×4): qty 1

## 2023-06-19 MED ORDER — TORSEMIDE 20 MG PO TABS
20.0000 mg | ORAL_TABLET | Freq: Every day | ORAL | Status: DC
  Administered 2023-06-19 – 2023-06-26 (×7): 20 mg via ORAL
  Filled 2023-06-19 (×8): qty 1

## 2023-06-19 MED ORDER — SPIRONOLACTONE 12.5 MG HALF TABLET
12.5000 mg | ORAL_TABLET | Freq: Every day | ORAL | Status: DC
  Administered 2023-06-19 – 2023-06-26 (×8): 12.5 mg via ORAL
  Filled 2023-06-19 (×8): qty 1

## 2023-06-19 MED ORDER — DILTIAZEM HCL ER COATED BEADS 120 MG PO CP24
120.0000 mg | ORAL_CAPSULE | Freq: Every day | ORAL | Status: DC
  Administered 2023-06-19 – 2023-06-26 (×8): 120 mg via ORAL
  Filled 2023-06-19 (×12): qty 1

## 2023-06-19 MED ORDER — LEVOTHYROXINE SODIUM 25 MCG PO TABS
137.0000 ug | ORAL_TABLET | Freq: Every day | ORAL | Status: DC
  Administered 2023-06-19 – 2023-06-26 (×7): 137 ug via ORAL
  Filled 2023-06-19 (×8): qty 1

## 2023-06-19 MED ORDER — AMITRIPTYLINE HCL 25 MG PO TABS
25.0000 mg | ORAL_TABLET | Freq: Every day | ORAL | Status: DC
  Administered 2023-06-19 – 2023-06-25 (×7): 25 mg via ORAL
  Filled 2023-06-19 (×7): qty 1

## 2023-06-19 MED ORDER — METFORMIN HCL ER 500 MG PO TB24
500.0000 mg | ORAL_TABLET | Freq: Two times a day (BID) | ORAL | Status: DC
  Administered 2023-06-19 – 2023-06-22 (×8): 500 mg via ORAL
  Filled 2023-06-19 (×13): qty 1

## 2023-06-19 NOTE — ED Notes (Signed)
 Pt husband at bedside requesting to speak to social worker, this RN contacted [facility] Regal social work, pt provided her contact information and talking to Royal on his personal phone.

## 2023-06-19 NOTE — Telephone Encounter (Signed)
 Noted.

## 2023-06-19 NOTE — Discharge Planning (Signed)
 RNCM received call from [facility] regarding mutual pt.  Frances Furbish rep states that they can not resume services at this time.  Omari Koslosky J. Lucretia Roers, RN, BSN, NCM  Transitions of Care  Nurse Case Manager  [facility] Emergency Departments  Operative Services  [phone]

## 2023-06-19 NOTE — ED Notes (Signed)
 Social work at bedside.

## 2023-06-19 NOTE — Evaluation (Signed)
 Physical Therapy Evaluation Patient Details Name: Breanna Webster MRN: [id number] DOB: August 07, 1945 Today's Date: 06/19/2023  History of Present Illness  Pt is 78 yo [facility] ED on [facility]/25 due to a fall. Pt was recently admitted to hospital on 2/3 due to worsening sundowning.  PMH includes GAD, CAD, afib, chronic back pain. CHF, CVA, GERD, Parkinson's syndrome, DMII.  Clinical Impression  Pt is presenting below baseline level of functioning. Pt is lethartic currently and not following directions well. Total Assist for bed mobility and Min A from elevated stetcher for standing with heavy posterior lean. Pt was able to take 5 side steps with max A for RLE to progress laterally. Due to pt current functional status, home set up and available assistance at home recommending skilled physical therapy services < 3 hours/day in order to address strength, balance and functional mobility to decrease risk for falls, injury, immobility, skin break down and re-hospitalization.          If plan is discharge home, recommend the following: A lot of help with walking and/or transfers;A lot of help with bathing/dressing/bathroom;Assistance with cooking/housework;Assist for transportation;Help with stairs or ramp for entrance;Supervision due to cognitive status   Can travel by private vehicle   No    Equipment Recommendations Hospital bed;Hoyer lift     Functional Status Assessment Patient has had a recent decline in their functional status and demonstrates the ability to make significant improvements in function in a reasonable and predictable amount of time.     Precautions / Restrictions Precautions Precautions: Fall Precaution/Restrictions Comments: sundowning, anxiety Restrictions Weight Bearing Restrictions Per Provider Order: No      Mobility  Bed Mobility Overal bed mobility: Needs Assistance Bed Mobility: Supine to Sit, Sit to Supine     Supine to sit: Total assist Sit to supine:  Total assist   General bed mobility comments: Pt was total assist with poor initiation for both movements despite multi modal cueing.    Transfers Overall transfer level: Needs assistance Equipment used: Rolling walker (2 wheels) Transfers: Sit to/from Stand Sit to Stand: Min assist     General transfer comment: pt has slightly flexion at the hips in full standing with lean on the stretcher    Ambulation/Gait   Pre-gait activities: attempted side steps at EOB at Max A with RW with Max A progressing the RLE and multimodal cues throughout for sequencing.        Balance Overall balance assessment: Needs assistance Sitting-balance support: No upper extremity supported, Feet supported Sitting balance-Leahy Scale: Fair     Standing balance support: Reliant on assistive device for balance, Bilateral upper extremity supported, During functional activity Standing balance-Leahy Scale: Poor Standing balance comment: minA       Pertinent Vitals/Pain Pain Assessment Pain Assessment: Faces Faces Pain Scale: No hurt Breathing: normal Negative Vocalization: none Facial Expression: smiling or inexpressive Body Language: relaxed Consolability: no need to console PAINAD Score: 0 Pain Intervention(s): Monitored during session    Home Living Family/patient expects to be discharged to:: Private residence Living Arrangements: Spouse/significant other Available Help at Discharge: Family;Available 24 hours/day Type of Home: House Home Access: Level entry       Home Layout: One level Home Equipment: Cane - single Librarian, academic (2 wheels);Shower seat;Wheelchair - manual Additional Comments: taken from previous PT note due to pt is a poor historian and no family present.    Prior Function Prior Level of Function : Needs assist  Mobility Comments: ambulatory with SPC ADLs Comments: PRN assistance from spouse for ADLs     Extremity/Trunk Assessment   Upper  Extremity Assessment Upper Extremity Assessment: Generalized weakness    Lower Extremity Assessment Lower Extremity Assessment: Generalized weakness    Cervical / Trunk Assessment Cervical / Trunk Assessment: Kyphotic  Communication   Communication Communication: Impaired Factors Affecting Communication: Difficulty expressing self    Cognition Arousal: Lethargic Behavior During Therapy: Flat affect   PT - Cognitive impairments: History of cognitive impairments       Following commands: Impaired Following commands impaired: Follows one step commands inconsistently, Follows one step commands with increased time     Cueing Cueing Techniques: Verbal cues, Gestural cues, Tactile cues, Visual cues     General Comments General comments (skin integrity, edema, etc.): Pt lethargic throughout session; poor command following.        Assessment/Plan    PT Assessment Patient needs continued PT services  PT Problem List Decreased strength;Decreased activity tolerance;Decreased balance;Decreased mobility;Decreased cognition;Decreased knowledge of use of DME;Decreased safety awareness;Decreased knowledge of precautions       PT Treatment Interventions DME instruction;Gait training;Therapeutic activities;Functional mobility training;Therapeutic exercise;Balance training;Neuromuscular re-education;Cognitive remediation;Patient/family education    PT Goals (Current goals can be found in the Care Plan section)  Acute Rehab PT Goals Patient Stated Goal: per case management note goal is for pt to return home. PT Goal Formulation: Patient unable to participate in goal setting Time For Goal Achievement: 07/03/23 Potential to Achieve Goals: Fair    Frequency Min 1X/week        AM-PAC PT "6 Clicks" Mobility  Outcome Measure Help needed turning from your back to your side while in a flat bed without using bedrails?: Total Help needed moving from lying on your back to sitting on the  side of a flat bed without using bedrails?: Total Help needed moving to and from a bed to a chair (including a wheelchair)?: Total Help needed standing up from a chair using your arms (e.g., wheelchair or bedside chair)?: Total Help needed to walk in hospital room?: Total Help needed climbing 3-5 steps with a railing? : Total 6 Click Score: 6    End of Session Equipment Utilized During Treatment: Gait belt Activity Tolerance: Patient limited by fatigue;Patient limited by lethargy Patient left: in bed;with call bell/phone within reach Nurse Communication: Mobility status PT Visit Diagnosis: Other abnormalities of gait and mobility (R26.89);Muscle weakness (generalized) (M62.81)    Time: 1610-9604 PT Time Calculation (min) (ACUTE ONLY): 14 min   Charges:   PT Evaluation $PT Eval Low Complexity: 1 Low   PT General Charges $$ ACUTE PT VISIT: 1 Visit       Harrel Carina, DPT, CLT  Acute Rehabilitation Services Office: [phone] (Secure chat preferred)   Claudia Desanctis 06/19/2023, 12:41 PM

## 2023-06-19 NOTE — Telephone Encounter (Signed)
 Pt spouse notified of update and verified understanding. Pt spouse is wanting pt to do this at home and not in a facility. Looks like the referral was sent to [facility] which offers in home care. I sent a message to referral coordinator for assistance.

## 2023-06-19 NOTE — Progress Notes (Addendum)
 1:20pm: CSW spoke with [name] at bedside. All options were discussed. [name] states the couple has two children - one in [place] and one in [place] and both are unable to assist in providing care. [name] states he is not interested in moving into a retirement community. [name] state she will review list of in home care agencies and attempt to obtain their services. [name] requesting patient stay overnight to allow him to get services in place - CSW explained to [name] that CSW does not grant permission to board in the ED but CSW will notify MD of request.  12:30pm: CSW received call from [place]. CSW repeated the same information that was discussed this morning. [name] requesting CSW call him back in a few hours after he has time to obtain support in the home. CSW explained that home health services had been obtained and will be provided by [facility].  12pm: CSW spoke with Elnita Maxwell of [facility] who states the agency can accept patient for PT, OT, and RN services. Elnita Maxwell will speak with patient's husband for further discussion.  10:35am: CSW spoke with patient's husband [name] who states patient's PCP office made a referral to [facility] without his permission. [name] states he wants to work with [facility] for palliative services. [name] agreeable for CSW to reach out to CMA regarding this information. [name] states he does not want patient placed into a facility. CSW explained to [name] how to obtain private duty sitter services - [name] requesting CSW send a list of agency options to his email @ [email or web address] [name] states he and patient's income is too high to be considered for Medicaid. [name] states he is terrified to bring patient home without help as she has frequent falls.  CSW sent list of private duty agencies to [place] to contact to arrange for in home support.  8:20am: CSW attempted to reach patient's husband [name] without success - a voicemail was let  requesting a return call.  Edwin Dada, MSW, LCSW Transitions of Care  Clinical Social Worker II [phone]

## 2023-06-19 NOTE — Telephone Encounter (Signed)
 Angela notified that Kandee Keen is aware.

## 2023-06-19 NOTE — ED Provider Notes (Signed)
 Emergency Medicine Observation Re-evaluation Note  Breanna Webster is a 78 y.o. female, seen on rounds today.  Pt initially presented to the ED for complaints of Fall Currently, the patient is in room without complaint.  Physical Exam  BP (!) 171/98   Pulse (!) 107   Temp 98.3 F (36.8 C) (Axillary)   Resp (!) 39   Ht 5\' 6"  (1.676 m)   Wt 72.6 kg   SpO2 98%   BMI 25.82 kg/m  Physical Exam General: Awake, no distress Cardiac: Normal rate Lungs: Normal effort Psych: Appropriate mood  ED Course / MDM  EKG:EKG Interpretation Date/Time:  Tuesday June 18 2023 22:45:24 EST Ventricular Rate:  113 PR Interval:  140 QRS Duration:  159 QT Interval:  387 QTC Calculation: 531 R Axis:   -46  Text Interpretation: Sinus tachycardia Atrial premature complexes Probable left atrial enlargement Left bundle branch block Confirmed by Glyn Ade [phone]) on 06/18/2023 10:51:10 PM  I have reviewed the labs performed to date as well as medications administered while in observation.  Recent changes in the last 24 hours include medical workup, being medically cleared, now awaiting placement..  Plan  Current plan is for awaiting placement.    Anders Simmonds T, DO 06/19/23 [phone]

## 2023-06-20 ENCOUNTER — Ambulatory Visit: Payer: Medicare Other | Admitting: Neurology

## 2023-06-20 DIAGNOSIS — S34139A Unspecified injury to sacral spinal cord, initial encounter: Secondary | ICD-10-CM | POA: Diagnosis not present

## 2023-06-20 NOTE — ED Notes (Signed)
 Husband at beside given update about pt.

## 2023-06-20 NOTE — ED Notes (Signed)
 PT requests that all meds be taken with applesauce because she is afraid to choke

## 2023-06-20 NOTE — Progress Notes (Addendum)
 3:40pm: Per [place] Ana at [facility], no beds available at this time.  3:20pm: Per Whitney at [facility], no beds available at this time and no planned discharges.  CSW attempted to reach Angie at [facility] without success - a voicemail was left requesting a return call.  3pm: CSW reached out to Lester at [facility] and Sherwood at [facility] to request patient be reviewed for a possible bed offer.  2:30pm: CSW spoke with Belenda Cruise at [facility] who states the facility can offer a bed however one is not available right now and one won't be available until Monday.  1:07pm: CSW received return call from [place] at DSS who states the report was screened out.  11:20am: CSW received return call from [place] who states she spoke with [name] to discuss discharge planning. Ebony states that [name] wants patient placed at [facility] for [employer] so that he can work on in home care or ALF placement.  CSW has not received any bed offers yet.  10:15am: CSW spoke with patient's husband [name] who states he spoke with Erie Noe [phone]) at [facility] who to request assistance in finding in home care for patient. [name] states he did not follow up with any of the agencies on the list that was provided to him yesterday. [name] agreeable for CSW to attempt to locate a SNF for STR to give him time to get services in place.  CSW spoke with Karel Jarvis of the [facility] team. Karel Jarvis states patient and her husband were referred to her due to frequent 911 calls. Karel Jarvis states she has spoken with [name] regarding the needs at home and will attempt to assist him in obtaining the in home care services. CSW and Karel Jarvis discussed concerns regarding [name]'s memory deficits and agreed an APS report should be made for safety. Karel Jarvis states she will contact [name] for further discussion and return call to CSW.  CSW will complete FL2 and fax patient's clinicals out for review in attempt to obtain bed offer.  CSW spoke  with [place] at [facility] DSS to make APS report.  Edwin Dada, MSW, LCSW Transitions of Care  Clinical Social Worker II [phone]

## 2023-06-20 NOTE — ED Notes (Signed)
 Husband at bedside.

## 2023-06-20 NOTE — [place] FL2 (Signed)
 [place] MEDICAID FL2 LEVEL OF CARE FORM     IDENTIFICATION  Patient Name: Breanna Webster Birthdate: 1946-02-14 Sex: female Admission Date (Current Location): 06/18/2023  [facility] and [place] Number:  Producer, television/film/video and Address:  The . [facility], [address] [place], [place] [address]      Provider Number: [id number]  Attending Physician Name and Address:  System, Provider Not In  Relative Name and Phone Number:       Current Level of Care: Hospital Recommended Level of Care: Skilled Nursing Facility Prior Approval Number:    Date Approved/Denied:   PASRR Number: [id number]  Discharge Plan: SNF    Current Diagnoses: Patient Active Problem List   Diagnosis Date Noted   Neurocognitive deficits 05/29/2023   Chronic combined systolic and diastolic heart failure (HCC) 05/18/2023   Pyelonephritis 05/18/2023   Lactic acidosis 05/18/2023   AKI (acute kidney injury) (HCC) 04/07/2023   Depression 04/06/2023   Acute on chronic diastolic heart failure (HCC) 04/05/2023   Low back pain 03/25/2023   Intractable pain 10/16/2022   Type 2 diabetes mellitus with other specified complication, with long-term current use of insulin (HCC) 07/31/2022   Lymphedema of arm 07/31/2022   Preoperative cardiovascular examination 02/09/2022   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HCC) 10/25/2021   Left hemiparesis (HCC) 06/21/2021   Other specified hereditary hemolytic anemias (HCC) 06/21/2021   Parkinsonism (HCC) 06/21/2021   COVID-19 02/14/2021   Postviral fatigue syndrome 02/14/2021   Difficulty with adaptive servo-ventilation (ASV) use 02/14/2021   Sepsis secondary to UTI (HCC) 09/08/2020   Elevated ALT measurement 09/08/2020   History of stroke 09/08/2020   Hyperbilirubinemia 09/08/2020   Fatigue associated with anemia 08/03/2020   OSA treated with BiPAP 08/03/2020   Treatment-emergent central sleep apnea 08/03/2020   Chronic intermittent hypoxia with  obstructive sleep apnea 04/21/2020   Gait disturbance, post-stroke 03/29/2020   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 (HCC) 03/29/2020   Anxiety disorder due to medical condition    RLQ abdominal pain 10/22/2019   Paroxysmal atrial fibrillation (HCC) 05/22/2019   IDA (iron deficiency anemia) 05/07/2019   Streptococcal endocarditis    Endocarditis of mitral valve 10/07/2018   Severe aortic stenosis 07/08/2018   S/P TAVR (transcatheter aortic valve replacement) 07/08/2018   Esophageal thickening    Atherosclerotic heart disease of native coronary artery with other forms of angina pectoris (HCC) 04/22/2018   Gastroesophageal reflux disease    Pulmonary hypertension (HCC) 02/21/2018   Essential hypertension 07/15/2017   History of colonic polyps 05/22/2017   Elevated liver function tests 12/05/2016   DOE (dyspnea on exertion) 07/19/2016   Thalassemia minor 05/29/2016   Left bundle branch block 12/06/2015   Upper airway cough syndrome 10/14/2015   (HFpEF) heart failure with preserved ejection fraction (HCC) 10/10/2015   Vitamin D deficiency 03/10/2009   Hypothyroidism 12/13/2008   Other specified disorders of bladder 12/13/2008    Orientation RESPIRATION BLADDER Height & Weight     Self, Place  Normal Continent Weight: 160 lb (72.6 kg) Height:  5\' 6"  (167.6 cm)  BEHAVIORAL SYMPTOMS/MOOD NEUROLOGICAL BOWEL NUTRITION STATUS      Continent Diet (Regular diet)  AMBULATORY STATUS COMMUNICATION OF NEEDS Skin   Extensive Assist Verbally Normal                       Personal Care Assistance Level of Assistance  Bathing, Feeding, Dressing Bathing Assistance: Maximum assistance Feeding assistance: Limited assistance Dressing Assistance:  Maximum assistance     Functional Limitations Info  Speech, Sight, Hearing Sight Info: Adequate Hearing Info: Adequate Speech Info: Adequate    SPECIAL CARE FACTORS FREQUENCY  OT (By licensed OT), PT (By licensed PT)     PT  Frequency: 5x weekly OT Frequency: 5x weekly            Contractures Contractures Info: Not present    Additional Factors Info  Code Status, Insulin Sliding Scale Code Status Info: Full Code Allergies Info: Statins   Insulin Sliding Scale Info: See discharge summary       Current Medications (06/20/2023):  This is the current hospital active medication list Current Facility-Administered Medications  Medication Dose Route Frequency Provider Last Rate Last Admin   acetaminophen (TYLENOL) tablet 650 mg  650 mg Oral Q4H PRN Glyn Ade, MD       allopurinol (ZYLOPRIM) tablet 100 mg  100 mg Oral Daily Anders Simmonds T, DO   100 mg at 06/20/23 1000   amitriptyline (ELAVIL) tablet 25 mg  25 mg Oral QHS Anders Simmonds T, DO   25 mg at 06/19/23 2241   apixaban (ELIQUIS) tablet 5 mg  5 mg Oral BID Anders Simmonds T, DO   5 mg at 06/20/23 1001   diltiazem (CARDIZEM CD) 24 hr capsule 120 mg  120 mg Oral Daily Anders Simmonds T, DO   120 mg at 06/20/23 1000   levothyroxine (SYNTHROID) tablet 137 mcg  137 mcg Oral Q0600 Anders Simmonds T, DO   137 mcg at 06/20/23 0646   metFORMIN (GLUCOPHAGE-XR) 24 hr tablet 500 mg  500 mg Oral BID WC Anders Simmonds T, DO   500 mg at 06/20/23 1000   spironolactone (ALDACTONE) tablet 12.5 mg  12.5 mg Oral Daily Anders Simmonds T, DO   12.5 mg at 06/20/23 1610   torsemide (DEMADEX) tablet 20 mg  20 mg Oral Daily Anders Simmonds T, DO   20 mg at 06/20/23 1000   Current Outpatient Medications  Medication Sig Dispense Refill   acetaminophen (TYLENOL) 500 MG tablet Take 1 tablet (500 mg total) by mouth 4 (four) times daily. 30 tablet 0   allopurinol (ZYLOPRIM) 100 MG tablet Take 1 tablet (100 mg total) by mouth daily. 90 tablet 1   amitriptyline (ELAVIL) 25 MG tablet Take 1 tablet (25 mg total) by mouth at bedtime. 90 tablet 1   apixaban (ELIQUIS) 5 MG TABS tablet Take 1 tablet (5 mg total) by mouth 2 (two) times daily. 60 tablet 3   b complex vitamins  capsule Take 1 capsule by mouth daily.     Cholecalciferol (D3 PO) Take 1 tablet by mouth daily.     clonazePAM (KLONOPIN) 0.5 MG tablet Take 1 tablet (0.5 mg total) by mouth 2 (two) times daily as needed for anxiety. (Patient taking differently: Take 1 mg by mouth 2 (two) times daily as needed for anxiety.) 30 tablet 0   colchicine 0.6 MG tablet Take 1 tablet (0.6 mg total) by mouth daily. as needed for gout (Patient taking differently: Take 0.6 mg by mouth daily as needed (gout).) 90 tablet 0   diltiazem (CARTIA XT) 120 MG 24 hr capsule Take 1 capsule (120 mg total) by mouth daily. 90 capsule 3   Insulin NPH, Human,, Isophane, (HUMULIN N KWIKPEN) 100 UNIT/ML Kiwkpen Inject 20 Units into the skin in the morning and at bedtime. Decrease your dose to 10 units every morning, and 7 units every evening still appetite back to normal (Patient  taking differently: Inject 16-20 Units into the skin in the morning and at bedtime. 22 units in the morning and 12 units in the evening)     metFORMIN (GLUCOPHAGE-XR) 500 MG 24 hr tablet Take 1 tablet by mouth 2 times daily 180 tablet 2   Multiple Vitamin (MULITIVITAMIN WITH MINERALS) TABS Take 1 tablet by mouth daily.     nitroGLYCERIN (NITROSTAT) 0.4 MG SL tablet Place 1 tablet (0.4 mg total) under the tongue every 5 (five) minutes as needed for chest pain. 25 tablet 4   omeprazole (PRILOSEC) 20 MG capsule Take 1 capsule (20 mg total) by mouth daily. 90 capsule 3   oxyCODONE (OXY IR/ROXICODONE) 5 MG immediate release tablet Take 5 mg by mouth every 6 (six) hours as needed for severe pain (pain score 7-10).     polyethylene glycol (MIRALAX) 17 g packet Take 17 g by mouth daily as needed.     Potassium 99 MG TABS Take 49.5 mg by mouth daily.     potassium chloride (KLOR-CON M) 10 MEQ tablet Take 1 tablet (10 mEq total) by mouth daily. Take in concurrence with torsemide. 90 tablet 1   spironolactone (ALDACTONE) 25 MG tablet Take 0.5 tablets (12.5 mg total) by mouth  daily. 15 tablet 0   SYNTHROID 137 MCG tablet Take 1 tablet (137 mcg total) by mouth every morning on empty stomach. 90 tablet 1   tiZANidine (ZANAFLEX) 4 MG tablet Take 1 tablet (4 mg total) by mouth 3 (three) times daily. (Patient taking differently: Take 4 mg by mouth daily as needed for muscle spasms.) 90 tablet 1   torsemide (DEMADEX) 20 MG tablet Take 2 tablets (40 mg total) by mouth daily. (Patient taking differently: Take 20 mg by mouth daily.) 60 tablet 0   Vibegron (GEMTESA) 75 MG TABS Take 75 mg by mouth at bedtime.     vitamin E 180 MG (400 UNITS) capsule Take 400 Units by mouth daily.     bisacodyl (DULCOLAX) 5 MG EC tablet Take 1 Tablet with each dose pf pain medicine. (Patient not taking: Reported on 06/15/2023) 100 tablet 1   gabapentin (NEURONTIN) 100 MG capsule Take 1 capsule (100 mg total) by mouth 3 (three) times daily. (Patient not taking: Reported on 06/18/2023) 90 capsule 0   Insulin NPH, Human,, Isophane, (HUMULIN N KWIKPEN) 100 UNIT/ML Kiwkpen Inject 20 units into the skin every morning and 16 units into the skin every evening. (Patient not taking: Reported on 06/15/2023) 10 mL 0   insulin regular (HUMULIN R) 100 units/mL injection Please decrease your dose to 5 units 3 times daily before meals until appetite picks up back to normal (Patient not taking: Reported on 06/18/2023)     Lactulose 20 GM/30ML SOLN Take 15 mLs (10 g total) by mouth 3 (three) times daily. (Patient not taking: Reported on 06/15/2023) 1350 mL 0   LORazepam (ATIVAN) 0.5 MG tablet For panic attacks (Patient not taking: Reported on 06/18/2023) 20 tablet 0   ondansetron (ZOFRAN) 4 MG tablet Take 1 tablet (4 mg total) by mouth every 8 (eight) hours as needed for nausea or vomiting. (Patient not taking: Reported on 06/15/2023) 40 tablet 1     Discharge Medications: Please see discharge summary for a list of discharge medications.  Relevant Imaging Results:  Relevant Lab Results:   Additional Information SSN:  [id number]  Inis Sizer, LCSW

## 2023-06-20 NOTE — ED Notes (Signed)
 PT called husband and told him that this RN does not like her kind of people.This RN informed the PT that  this statement was not made.

## 2023-06-20 NOTE — ED Notes (Signed)
 PT continually calling "nurse" and using call bell at the same time to get assistance.

## 2023-06-20 NOTE — ED Notes (Signed)
 PT requests that I throw her food away because she is on a kosher diet.PT only wanted the cookies, milk and bread off her tray.

## 2023-06-20 NOTE — ED Provider Notes (Signed)
 Emergency Medicine Observation Re-evaluation Note  Breanna Webster is a 78 y.o. female, seen on rounds today.  Pt initially presented to the ED for complaints of Fall Currently, the patient is asleep.  Physical Exam  BP (!) 131/55   Pulse 91   Temp 97.9 F (36.6 C) (Oral)   Resp (!) 22   Ht 5\' 6"  (1.676 m)   Wt 72.6 kg   SpO2 95%   BMI 25.82 kg/m  Physical Exam General: asleep Cardiac: asleep Lungs: asleep Psych: asleep  ED Course / MDM  EKG:EKG Interpretation Date/Time:  Thursday June 20 2023 03:35:27 EST Ventricular Rate:  85 PR Interval:  211 QRS Duration:  165 QT Interval:  457 QTC Calculation: 544 R Axis:   -45  Text Interpretation: Sinus rhythm LAE, consider biatrial enlargement Left bundle branch block No acute changes Confirmed by Drema Pry [phone]) on 06/20/2023 4:01:42 AM  I have reviewed the labs performed to date as well as medications administered while in observation.  Recent changes in the last 24 hours include home meds.  Plan  Current plan is for placement.    Pricilla Loveless, MD 06/20/23 [phone]

## 2023-06-20 NOTE — ED Notes (Signed)
 Pt complaining of left side chest pain. EKG obtained and given to MD.

## 2023-06-20 NOTE — ED Notes (Signed)
 Pt speaking to her husband on the phone & became very upset he left her here.

## 2023-06-21 DIAGNOSIS — S34139A Unspecified injury to sacral spinal cord, initial encounter: Secondary | ICD-10-CM | POA: Diagnosis not present

## 2023-06-21 MED ORDER — POLYETHYLENE GLYCOL 3350 17 G PO PACK
17.0000 g | PACK | Freq: Every day | ORAL | Status: DC
  Administered 2023-06-21 – 2023-06-26 (×5): 17 g via ORAL
  Filled 2023-06-21 (×6): qty 1

## 2023-06-21 MED ORDER — CLONAZEPAM 0.5 MG PO TABS
1.0000 mg | ORAL_TABLET | Freq: Two times a day (BID) | ORAL | Status: DC | PRN
  Administered 2023-06-21 – 2023-06-25 (×9): 1 mg via ORAL
  Filled 2023-06-21 (×10): qty 2

## 2023-06-21 MED ORDER — DOCUSATE SODIUM 100 MG PO CAPS
100.0000 mg | ORAL_CAPSULE | Freq: Once | ORAL | Status: AC
  Administered 2023-06-21: 100 mg via ORAL
  Filled 2023-06-21: qty 1

## 2023-06-21 MED ORDER — SENNA 8.6 MG PO TABS
2.0000 | ORAL_TABLET | Freq: Every day | ORAL | Status: DC
  Administered 2023-06-21 – 2023-06-25 (×5): 17.2 mg via ORAL
  Filled 2023-06-21 (×5): qty 2

## 2023-06-21 MED ORDER — MAGNESIUM CITRATE PO SOLN
1.0000 | Freq: Once | ORAL | Status: AC
  Administered 2023-06-21: 1 via ORAL
  Filled 2023-06-21: qty 296

## 2023-06-21 MED ORDER — OXYCODONE HCL 5 MG PO TABS
5.0000 mg | ORAL_TABLET | Freq: Four times a day (QID) | ORAL | Status: DC | PRN
  Administered 2023-06-21 – 2023-06-26 (×9): 5 mg via ORAL
  Filled 2023-06-21 (×9): qty 1

## 2023-06-21 MED ORDER — FLEET ENEMA RE ENEM
1.0000 | ENEMA | Freq: Once | RECTAL | Status: AC
  Administered 2023-06-22: 1 via RECTAL
  Filled 2023-06-21: qty 1

## 2023-06-21 NOTE — ED Notes (Signed)
 Please update husband

## 2023-06-21 NOTE — Progress Notes (Addendum)
 10:45am: CSW spoke with RN via secure chat who states per patient's husband, he wants to accept bed offer from [facility].  CSW notified Belenda Cruise at [facility] of acceptance - first available bed is Monday but if one opens up she will notify CSW.  9:40am: CSW spoke with patient's husband Breanna Webster to present him with bed offer from [facility]. Breanna Webster states he will be to the hospital in approximately one hour to discuss information with his wife. Breanna Webster requesting CSW return call to him at 11am.  Edwin Dada, MSW, LCSW Transitions of Care  Clinical Social Worker II [phone]

## 2023-06-21 NOTE — ED Provider Notes (Signed)
 Emergency Medicine Observation Re-evaluation Note  Breanna Webster is a 78 y.o. female, seen on rounds today.  Pt initially presented to the ED for complaints of Fall Currently, the patient is sleeping.  Physical Exam  BP 131/69 (BP Location: Right Arm)   Pulse 89   Temp 97.8 F (36.6 C) (Oral)   Resp 16   Ht 5\' 6"  (1.676 m)   Wt 72.6 kg   SpO2 99%   BMI 25.82 kg/m  Physical Exam General: No acute distress Lungs: No respiratory distress  ED Course / MDM  EKG:EKG Interpretation Date/Time:  Thursday June 20 2023 03:35:27 EST Ventricular Rate:  85 PR Interval:  211 QRS Duration:  165 QT Interval:  457 QTC Calculation: 544 R Axis:   -45  Text Interpretation: Sinus rhythm LAE, consider biatrial enlargement Left bundle branch block No acute changes Confirmed by Drema Pry [phone]) on 06/20/2023 4:01:42 AM  I have reviewed the labs performed to date as well as medications administered while in observation.  Recent changes in the last 24 hours include the patient has not had a bowel movement and is requesting [facility].  Plan  Current plan is for placement.    Durwin Glaze, MD 06/21/23 [phone]

## 2023-06-21 NOTE — ED Notes (Signed)
 PT's linen changed and gown changed and PT requested pain medication. PT is still c/o of no bowel movement.

## 2023-06-21 NOTE — Progress Notes (Signed)
 Physical Therapy Treatment Patient Details Name: Breanna Webster MRN: [id number] DOB: [birth year] Today's Date: 06/21/2023   History of Present Illness Pt is 78 yo presenting [facility] ED on 2/25 due to a fall. Pt was recently admitted to hospital on 2/3 due to worsening sundowning.  PMH includes GAD, CAD, afib, chronic back pain. CHF, CVA, GERD, Parkinson's syndrome, DMII.    PT Comments  Pt with gradual improvement.  She does have some confusion but able to participate.  More limited by anxiety and fear of falling needing max encouragement/reassurance/cues.  Did perform multiple stands and pivoted to/from [name] with mod/max A.  Pt does support good weight through feet, just has difficult moving feet for transfers.  Would benefit from +2 for safety and to improve pt's confidence.  Pt with chronic back pain - suspect worsened from being on stretcher and constipation (stomach very tight/bloated).  Spent increased time trying to position. Spouse mentioned pt hasn't slept in bed in at least a year - discussed with nursing could benefit from recliner but is +2 for transfer.  Cont POC. Patient will benefit from continued inpatient follow up therapy, <3 hours/day     If plan is discharge home, recommend the following: A lot of help with walking and/or transfers;A lot of help with bathing/dressing/bathroom;Assistance with cooking/housework;Assist for transportation;Help with stairs or ramp for entrance;Supervision due to cognitive status   Can travel by private vehicle     No  Equipment Recommendations  Hospital bed;Hoyer lift    Recommendations for Other Services       Precautions / Restrictions Precautions Precautions: Fall     Mobility  Bed Mobility Overal bed mobility: Needs Assistance Bed Mobility: Supine to Sit, Sit to Supine     Supine to sit: Max assist Sit to supine: +2 for physical assistance, Total assist   General bed mobility comments: Pt needing assist to move legs to EOB then max  A to lift trunk and scoot forward - she did assist some in lifting.  Return to bed total x 2 as pt near edge of stretcher and fearful of falling    Transfers Overall transfer level: Needs assistance Equipment used: Rolling walker (2 wheels), 1 person hand held assist Transfers: Sit to/from Stand, Bed to chair/wheelchair/BSC Sit to Stand: Mod assist Stand pivot transfers: Mod assist         General transfer comment: Performed STS from elevated stretcher x 2 and BSC x 1.  Started with RW but for transfer did 1 person pivot technique to be closer to pt.  During pivot requiring support under buttock for standing and weight shifting.  Pivoted to and from [facility].  Needs max encouragement/reassurance/and cues during transfer, very anxious and fearful of falling.  Pt reports very constipated, was not able to use BSC.    Ambulation/Gait                   Stairs             Wheelchair Mobility     Tilt Bed    Modified Rankin (Stroke Patients Only)       Balance Overall balance assessment: Needs assistance Sitting-balance support: No upper extremity supported, Feet supported Sitting balance-Leahy Scale: Fair     Standing balance support: Reliant on assistive device for balance, Bilateral upper extremity supported, During functional activity Standing balance-Leahy Scale: Poor  Communication    Cognition Arousal: Alert Behavior During Therapy: Anxious   PT - Cognitive impairments: History of cognitive impairments                       PT - Cognition Comments: Pt oriented this morning.  She is very anxious and fearful of falling.  Needs encouragement/reassuring throughout session. Does follow basic commands Following commands: Impaired Following commands impaired: Only follows one step commands consistently    Cueing Cueing Techniques: Verbal cues, Gestural cues, Tactile cues, Visual cues  Exercises General Exercises  - Lower Extremity Ankle Circles/Pumps: AAROM, Both, 10 reps, Supine (with heel cord stretch) Heel Slides: AAROM, Both, 10 reps, Supine    General Comments General comments (skin integrity, edema, etc.): Some c/o lightheadedness - VSS, seemed to improve with cues for relaxation and breathing.  Spouse present throughout.      Pertinent Vitals/Pain Pain Assessment Pain Assessment: Faces Faces Pain Scale: Hurts a little bit Pain Location: back Pain Descriptors / Indicators: Constant, Discomfort Pain Intervention(s): Limited activity within patient's tolerance, Monitored during session, Repositioned (Increased time spent repositioning)    Home Living                          Prior Function            PT Goals (current goals can now be found in the care plan section) Progress towards PT goals: Progressing toward goals    Frequency    Min 1X/week      PT Plan      Co-evaluation              AM-PAC PT "6 Clicks" Mobility   Outcome Measure  Help needed turning from your back to your side while in a flat bed without using bedrails?: A Lot Help needed moving from lying on your back to sitting on the side of a flat bed without using bedrails?: A Lot Help needed moving to and from a bed to a chair (including a wheelchair)?: A Lot Help needed standing up from a chair using your arms (e.g., wheelchair or bedside chair)?: A Lot Help needed to walk in hospital room?: Total Help needed climbing 3-5 steps with a railing? : Total 6 Click Score: 10    End of Session Equipment Utilized During Treatment: Gait belt Activity Tolerance: Patient tolerated treatment well Patient left: in bed;with call bell/phone within reach;with family/visitor present (ED on stretcher) Nurse Communication: Mobility status;Other (comment) (Pt typically sleeps in recliner -could benefit from recliner due to back pain if one available.  Pt asking for something more for constipation - did try to  use BSC but unable) PT Visit Diagnosis: Other abnormalities of gait and mobility (R26.89);Muscle weakness (generalized) (M62.81)     Time: 6213-0865 PT Time Calculation (min) (ACUTE ONLY): 46 min  Charges:    $Therapeutic Exercise: 8-22 mins $Therapeutic Activity: 23-37 mins PT General Charges $$ ACUTE PT VISIT: 1 Visit                     Anise Salvo, PT Acute Rehab [facility] [phone]    Rayetta Humphrey 06/21/2023, 2:01 PM

## 2023-06-21 NOTE — ED Notes (Signed)
 PT given pain meds with applesauce and minimal water due to pt c/o not being able to swallow.

## 2023-06-22 DIAGNOSIS — S34139A Unspecified injury to sacral spinal cord, initial encounter: Secondary | ICD-10-CM | POA: Diagnosis not present

## 2023-06-22 NOTE — ED Notes (Signed)
 Pt wet her brief , states her stomach hurts and she needs to go. Pt is all cleaned up but still stating she needs to go and needs her husband.

## 2023-06-22 NOTE — ED Notes (Signed)
 Patient urinated in bedpan, brief changed, patient repositioned in bed and sat up in a position of comfort. Patient has no complaints at this time.

## 2023-06-22 NOTE — ED Provider Notes (Signed)
 Emergency Medicine Observation Re-evaluation Note  Breanna Webster is a 78 y.o. female, seen on rounds today.  Pt initially presented to the ED for complaints of  general weakness. Pt has been a TOC boarding patient in ED awaiting SNF placement. SNF placement arranged for Monday (or sooner if bed becomes available sooner). No new c/o this AM.   Physical Exam  BP 131/69 (BP Location: Right Arm)   Pulse 89   Temp 97.8 F (36.6 C) (Oral)   Resp 16   Ht 1.676 m (5\' 6" )   Wt 72.6 kg   SpO2 99%   BMI 25.82 kg/m  Physical Exam General: calm, nad.  Cardiac: regular rate.  Lungs: breathing comfortably.    ED Course / MDM    I have reviewed the labs performed to date as well as medications administered while in observation.  Recent changes in the last 24 hours include ED obs, TOC consult/services, reassessment.   Plan  Current plan is for patient to be d/c'd to [facility] on Monday. TOC team continues to follow.     Cathren Laine, MD 06/22/23 [phone]

## 2023-06-22 NOTE — Progress Notes (Signed)
 Current plan is for patient to discharge to [facility] on Monday or sooner if a bed becomes available this weekend.  Edwin Dada, MSW, LCSW Transitions of Care  Clinical Social Worker II [phone]

## 2023-06-23 DIAGNOSIS — S34139A Unspecified injury to sacral spinal cord, initial encounter: Secondary | ICD-10-CM | POA: Diagnosis not present

## 2023-06-23 MED ORDER — METFORMIN HCL ER 500 MG PO TB24
500.0000 mg | ORAL_TABLET | Freq: Two times a day (BID) | ORAL | Status: DC
  Administered 2023-06-23 – 2023-06-26 (×6): 500 mg via ORAL
  Filled 2023-06-23 (×9): qty 1

## 2023-06-23 MED ORDER — TRAZODONE HCL 100 MG PO TABS
100.0000 mg | ORAL_TABLET | Freq: Every day | ORAL | Status: DC
  Administered 2023-06-23 – 2023-06-25 (×3): 100 mg via ORAL
  Filled 2023-06-23: qty 2
  Filled 2023-06-23 (×2): qty 1

## 2023-06-23 MED ORDER — GABAPENTIN 100 MG PO CAPS
100.0000 mg | ORAL_CAPSULE | Freq: Three times a day (TID) | ORAL | Status: DC
  Administered 2023-06-23 – 2023-06-26 (×8): 100 mg via ORAL
  Filled 2023-06-23 (×8): qty 1

## 2023-06-23 NOTE — ED Provider Notes (Signed)
 Called to bedside. Pt appears to be sundowning. Ordered for some trazodone. Psych also recommended gabapentin for her pain and anxiety which has also been ordered. Bed alarm also ordered since she was trying to get out of bed.    Rondel Baton, MD 06/23/23 [phone]

## 2023-06-23 NOTE — TOC Progression Note (Signed)
 Transition of Care [facility]) - Progression Note    Patient Details  Name: Breanna Webster MRN: [id number] Date of Birth: 22-Nov-1945  [facility]) CM/SW Contact  Lockie Pares, RN Phone Number: 06/23/2023, 11:31 AM  Clinical Narrative:    Husband asking about when patient is going tomorrow. Explained that we do not have a time. He asked that he be called when PTAR leaves so he can meet her at the SNF.          Expected Discharge Plan and Services      To [facility]                                         Social Determinants of Health (SDOH) Interventions SDOH Screenings   Food Insecurity: No Food Insecurity (05/24/2023)  Housing: Low Risk  (05/24/2023)  Transportation Needs: No Transportation Needs (05/24/2023)  Utilities: Not At Risk (05/24/2023)  Alcohol Screen: Low Risk  (08/29/2022)  Depression (PHQ2-9): Low Risk  (08/29/2022)  Financial Resource Strain: Patient Declined (12/09/2022)  Physical Activity: Unknown (12/09/2022)  Social Connections: Unknown (12/09/2022)  Stress: No Stress Concern Present (05/02/2023)   Received from [facility]  Tobacco Use: Low Risk  (06/18/2023)    Readmission Risk Interventions     No data to display

## 2023-06-23 NOTE — ED Notes (Signed)
Bed alarm placed.  

## 2023-06-23 NOTE — ED Provider Notes (Signed)
 Emergency Medicine Observation Re-evaluation Note  Breanna Webster is a 78 y.o. female, seen on rounds today.  Pt initially presented to the ED for complaints of Fall Currently, the patient is asleep.  Pt initially presented to the ED on 2/22 for anxiety and falls.  She was seen by psych and cleared and d/c home on the 24th.  She came back the next day for a fall.  She did not want to go to rehab, but husband did not feel she was safe to go home.  She has been here awaiting placement.  She has a bed at [facility] for Monday the 3rd. Physical Exam  BP 105/63   Pulse 74   Temp 97.8 F (36.6 C) (Oral)   Resp 18   Ht 5\' 6"  (1.676 m)   Wt 72.6 kg   SpO2 98%   BMI 25.82 kg/m  Physical Exam General: asleep Cardiac: rr Lungs: clear Psych: asleep  ED Course / MDM  EKG:EKG Interpretation Date/Time:  Thursday June 20 2023 03:35:27 EST Ventricular Rate:  85 PR Interval:  211 QRS Duration:  165 QT Interval:  457 QTC Calculation: 544 R Axis:   -45  Text Interpretation: Sinus rhythm LAE, consider biatrial enlargement Left bundle branch block No acute changes Confirmed by Drema Pry [phone]) on 06/20/2023 4:01:42 AM  I have reviewed the labs performed to date as well as medications administered while in observation.  Recent changes in the last 24 hours include none.  Plan  Current plan is for [facility] tomorrow.    Jacalyn Lefevre, MD 06/23/23 [phone]

## 2023-06-23 NOTE — ED Notes (Signed)
 Pt found with call bell cord wrapped around neck, when asked why pt was doing this and if pt was trying to hurt self, pt would only respond with "please help me". Cord removed from pts neck, Fluids/bathroom offered, pt saturating fine at time. Call bell tied to bed railing to keep inreach yet promote safety.

## 2023-06-24 ENCOUNTER — Encounter (HOSPITAL_COMMUNITY): Payer: Self-pay | Admitting: Emergency Medicine

## 2023-06-24 DIAGNOSIS — R404 Transient alteration of awareness: Secondary | ICD-10-CM | POA: Diagnosis not present

## 2023-06-24 DIAGNOSIS — S34139A Unspecified injury to sacral spinal cord, initial encounter: Secondary | ICD-10-CM | POA: Diagnosis not present

## 2023-06-24 DIAGNOSIS — Z7401 Bed confinement status: Secondary | ICD-10-CM | POA: Diagnosis not present

## 2023-06-24 DIAGNOSIS — M549 Dorsalgia, unspecified: Secondary | ICD-10-CM | POA: Diagnosis not present

## 2023-06-24 NOTE — ED Notes (Signed)
Called ptar for pt pick up

## 2023-06-24 NOTE — ED Provider Notes (Signed)
 Emergency Medicine Observation Re-evaluation Note  Breanna Webster is a 78 y.o. female, seen on rounds today.  Pt initially presented to the ED for complaints of Fall Currently, the patient is awake and anxious.  Pt's nurse reports that she did not sleep all night last night and has been crying.  She did receive trazodone and gabapentin without improvement.  Her nurse is going to give her the Klonopin and oxy in a few minutes to see if that will help. She is supposed to go to her new facility today.  Physical Exam  BP 135/79   Pulse 87   Temp 97.7 F (36.5 C) (Axillary)   Resp 16   Ht 5\' 6"  (1.676 m)   Wt 72.6 kg   SpO2 97%   BMI 25.82 kg/m  Physical Exam General: awake and alert Cardiac: rr Lungs: clear Psych: very anxious  ED Course / MDM  EKG:EKG Interpretation Date/Time:  Thursday June 20 2023 03:35:27 EST Ventricular Rate:  85 PR Interval:  211 QRS Duration:  165 QT Interval:  457 QTC Calculation: 544 R Axis:   -45  Text Interpretation: Sinus rhythm LAE, consider biatrial enlargement Left bundle branch block No acute changes Confirmed by Drema Pry [phone]) on 06/20/2023 4:01:42 AM  I have reviewed the labs performed to date as well as medications administered while in observation.  Recent changes in the last 24 hours include sundowning vs anxiety.  Plan  Current plan is for rehab placement.    Jacalyn Lefevre, MD 06/24/23 [phone]

## 2023-06-24 NOTE — Progress Notes (Addendum)
 2:45pm: CSW spoke with patient's husband to inform him of delay in discharge plan.  CSW spoke with MD to inform her of delay in discharge plan.  1:30pm: CSW spoke with Lafonda Mosses at [facility] who states case has been assigned the [facility] group and CSW needs to call [phone] for further discussion.  CSW spoke with Albin Felling at [facility] who states there is nothing she can do to expedite the screening process and it could take up to 5 business days.  1:10pm: Patient's PASRR is expired and she requires a new one before admission to [facility].  CSW submitted new screening along with documentation.  12:45pm: Patient will go to room 36 at [facility]. Patient will be transported via [facility] - RN to call when ready. The number for report is [phone].  9am: CSW spoke with Belenda Cruise who states patient's husband is scheduled to complete paperwork at 10:30am today.  8:10am: CSW spoke with Belenda Cruise at [facility] who states she will reach out to patient's husband [name] to discuss a time for paperwork to be completed and will notify CSW once facility is able to accept patient.  Edwin Dada, MSW, LCSW Transitions of Care  Clinical Social Worker II [phone]

## 2023-06-24 NOTE — ED Notes (Signed)
 Total bed change completed at this time. PT tolerated well. Dayshift RN given neurontin, see MAR for details. VS obtained and WNL. NADN, WCM.

## 2023-06-24 NOTE — Progress Notes (Signed)
 Name: Breanna Webster. Otterness DOB: 1945-06-12  Please be advised that the above-named patient will require a short-term nursing home stay -- anticipated 30 days or less for rehabilitation and strengthening. The plan is for return home.   Edwin Dada, MSW, LCSW Transitions of Care  Clinical Social Worker II [phone]

## 2023-06-25 ENCOUNTER — Ambulatory Visit (HOSPITAL_COMMUNITY): Payer: PRIVATE HEALTH INSURANCE | Admitting: Licensed Clinical Social Worker

## 2023-06-25 DIAGNOSIS — S34139A Unspecified injury to sacral spinal cord, initial encounter: Secondary | ICD-10-CM | POA: Diagnosis not present

## 2023-06-25 LAB — CBG MONITORING, ED
Glucose-Capillary: 175 mg/dL — ABNORMAL HIGH (ref 70–99)
Glucose-Capillary: 189 mg/dL — ABNORMAL HIGH (ref 70–99)
Glucose-Capillary: 201 mg/dL — ABNORMAL HIGH (ref 70–99)

## 2023-06-25 MED ORDER — LORAZEPAM 2 MG/ML IJ SOLN
0.5000 mg | Freq: Once | INTRAMUSCULAR | Status: DC
  Filled 2023-06-25: qty 1

## 2023-06-25 MED ORDER — INSULIN ASPART 100 UNIT/ML IJ SOLN
0.0000 [IU] | Freq: Three times a day (TID) | INTRAMUSCULAR | Status: DC
  Administered 2023-06-25: 2 [IU] via SUBCUTANEOUS
  Administered 2023-06-26: 3 [IU] via SUBCUTANEOUS
  Administered 2023-06-26: 2 [IU] via SUBCUTANEOUS

## 2023-06-25 MED ORDER — LORAZEPAM 2 MG/ML IJ SOLN
0.5000 mg | Freq: Once | INTRAMUSCULAR | Status: AC
  Administered 2023-06-25: 0.5 mg via INTRAMUSCULAR

## 2023-06-25 NOTE — ED Notes (Signed)
 Patient has been yelling for a few hours now with out stopping;Per report patient yelled non stop from 9am -5:30 pm today; EDP notified that patient need something else for agitation; pt is disturbing the unit at this time; Bed alarm placed and door closed to help with noise-Monique,RN

## 2023-06-25 NOTE — Progress Notes (Signed)
 Physical Therapy Treatment Patient Details Name: Breanna Webster MRN: [id number] DOB: [birth year] Today's Date: 06/25/2023   History of Present Illness Pt is 78 yo presenting to [facility] ED on 2/25 due to a fall. Pt was recently admitted to hospital on 2/3 due to worsening sundowning.  PMH includes GAD, CAD, afib, chronic back pain. CHF, CVA, GERD, Parkinson's syndrome, DMII.    PT Comments  Patient sleeping on arrival. Easily aroused and agreed to work with PT on mobility and strength. Patient very fearful of falling, therefore utilized +2 assist for reassurance. Patient remained very fearful and was willing to attempt standing with +2 HHA (mod assist) with nearly upright posture achieved. Worked on sitting balance and posture during seated rest breaks. Patient will benefit from continued inpatient follow up therapy, <3 hours/day     If plan is discharge home, recommend the following: A lot of help with walking and/or transfers;A lot of help with bathing/dressing/bathroom;Assistance with cooking/housework;Assist for transportation;Help with stairs or ramp for entrance;Supervision due to cognitive status   Can travel by private vehicle     No  Equipment Recommendations  Hospital bed;Hoyer lift    Recommendations for Other Services       Precautions / Restrictions Precautions Precautions: Fall Precaution/Restrictions Comments: sundowning, anxiety Restrictions Weight Bearing Restrictions Per Provider Order: No     Mobility  Bed Mobility Overal bed mobility: Needs Assistance Bed Mobility: Sidelying to Sit, Rolling, Sit to Sidelying Rolling: Max assist, Used rails Sidelying to sit: Max assist, +2 for physical assistance, HOB elevated, Used rails     Sit to sidelying: Max assist, +2 for physical assistance General bed mobility comments: multiple rolls rt and lt for placing bed linens; assist to move legs off/on the bed and to raise/lower torso    Transfers Overall transfer level: Needs  assistance Equipment used: 2 person hand held assist Transfers: Sit to/from Stand Sit to Stand: Mod assist, +2 physical assistance           General transfer comment: from bed at lowest height; bil HHA with blocking bil knees; pt achieves nearly upright (hips and knees slightly flexed); 2 reps    Ambulation/Gait             Pre-gait activities: unable to shift weight or advance either foot     Stairs             Wheelchair Mobility     Tilt Bed    Modified Rankin (Stroke Patients Only)       Balance Overall balance assessment: Needs assistance Sitting-balance support: No upper extremity supported, Feet supported Sitting balance-Leahy Scale: Fair Sitting balance - Comments: pt leans to the left, when head brought to midline, she can maintain her balance, but if released, she leans to the left again (head and torso)   Standing balance support: Reliant on assistive device for balance, Bilateral upper extremity supported, During functional activity Standing balance-Leahy Scale: Poor                              Communication Communication Communication: Impaired Factors Affecting Communication: Difficulty expressing self  Cognition Arousal: Lethargic Behavior During Therapy: Anxious   PT - Cognitive impairments: History of cognitive impairments                       PT - Cognition Comments: Pt oriented to self and DOB (otherwise NT).  She is very anxious and  fearful of falling.  Needs encouragement/reassuring throughout session. Does follow basic commands Following commands: Impaired Following commands impaired: Only follows one step commands consistently    Cueing Cueing Techniques: Verbal cues, Gestural cues, Tactile cues, Visual cues  Exercises Other Exercises Other Exercises: seated cervical rotation to rt; rt lateral flexion    General Comments        Pertinent Vitals/Pain Pain Assessment Pain Assessment: Faces Faces  Pain Scale: Hurts even more Pain Location: back Pain Descriptors / Indicators: Constant, Discomfort Pain Intervention(s): Limited activity within patient's tolerance, Monitored during session, Repositioned    Home Living                          Prior Function            PT Goals (current goals can now be found in the care plan section) Acute Rehab PT Goals Patient Stated Goal: per case management note goal is for pt to return home. Time For Goal Achievement: 07/03/23 Potential to Achieve Goals: Fair Progress towards PT goals: Progressing toward goals    Frequency    Min 1X/week      PT Plan      Co-evaluation              AM-PAC PT "6 Clicks" Mobility   Outcome Measure  Help needed turning from your back to your side while in a flat bed without using bedrails?: Total Help needed moving from lying on your back to sitting on the side of a flat bed without using bedrails?: Total Help needed moving to and from a bed to a chair (including a wheelchair)?: Total Help needed standing up from a chair using your arms (e.g., wheelchair or bedside chair)?: Total Help needed to walk in hospital room?: Total Help needed climbing 3-5 steps with a railing? : Total 6 Click Score: 6    End of Session Equipment Utilized During Treatment: Gait belt Activity Tolerance: Treatment limited secondary to medical complications (Comment) (anxiety re: falling) Patient left: in bed;with call bell/phone within reach;with bed alarm set (ED on stretcher) Nurse Communication: Mobility status PT Visit Diagnosis: Other abnormalities of gait and mobility (R26.89);Muscle weakness (generalized) (M62.81)     Time: 2536-6440 PT Time Calculation (min) (ACUTE ONLY): 21 min  Charges:    $Therapeutic Activity: 8-22 mins PT General Charges $$ ACUTE PT VISIT: 1 Visit                      Jerolyn Center, PT Acute Rehabilitation Services  Office [phone]    Zena Amos 06/25/2023,  2:56 PM

## 2023-06-25 NOTE — ED Notes (Signed)
 Patient continues to scream out for help; When asked what is wrong patient states "nothing". Patient is A&Ox 2

## 2023-06-25 NOTE — ED Provider Notes (Signed)
 Emergency Medicine Observation Re-evaluation Note  STORY CONTI is a 78 y.o. female, seen on rounds today.  Pt initially presented to the ED for complaints of Fall Currently, the patient is resting in bed.  Physical Exam  BP 119/68   Pulse 83   Temp 98.2 F (36.8 C) (Oral)   Resp 18   Ht 5\' 6"  (1.676 m)   Wt 72.6 kg   SpO2 96%   BMI 25.82 kg/m  Physical Exam General: Resting without acute agitation Cardiac: No murmur on my exam Lungs: Lungs clear bilaterally Psych: No agitation currently  ED Course / MDM  EKG:EKG Interpretation Date/Time:  Thursday June 20 2023 03:35:27 EST Ventricular Rate:  85 PR Interval:  211 QRS Duration:  165 QT Interval:  457 QTC Calculation: 544 R Axis:   -45  Text Interpretation: Sinus rhythm LAE, consider biatrial enlargement Left bundle branch block No acute changes Confirmed by Drema Pry [phone]) on 06/20/2023 4:01:42 AM  I have reviewed the labs performed to date as well as medications administered while in observation.  Recent changes in the last 24 hours include none reported to me.  Plan  Current plan is for awaiting placement.    Barba Solt, Canary Brim, MD 06/25/23 [phone]

## 2023-06-25 NOTE — Progress Notes (Signed)
 Patient's PASRR remains pending at this time.  Edwin Dada, MSW, LCSW Transitions of Care  Clinical Social Worker II [phone]

## 2023-06-26 ENCOUNTER — Other Ambulatory Visit (HOSPITAL_BASED_OUTPATIENT_CLINIC_OR_DEPARTMENT_OTHER): Payer: Self-pay

## 2023-06-26 DIAGNOSIS — F419 Anxiety disorder, unspecified: Secondary | ICD-10-CM | POA: Diagnosis not present

## 2023-06-26 DIAGNOSIS — R531 Weakness: Secondary | ICD-10-CM | POA: Diagnosis not present

## 2023-06-26 DIAGNOSIS — T17908A Unspecified foreign body in respiratory tract, part unspecified causing other injury, initial encounter: Secondary | ICD-10-CM | POA: Diagnosis not present

## 2023-06-26 DIAGNOSIS — R404 Transient alteration of awareness: Secondary | ICD-10-CM | POA: Diagnosis not present

## 2023-06-26 DIAGNOSIS — R1312 Dysphagia, oropharyngeal phase: Secondary | ICD-10-CM | POA: Diagnosis not present

## 2023-06-26 DIAGNOSIS — R451 Restlessness and agitation: Secondary | ICD-10-CM | POA: Diagnosis not present

## 2023-06-26 DIAGNOSIS — E559 Vitamin D deficiency, unspecified: Secondary | ICD-10-CM | POA: Diagnosis not present

## 2023-06-26 DIAGNOSIS — F05 Delirium due to known physiological condition: Secondary | ICD-10-CM | POA: Diagnosis not present

## 2023-06-26 DIAGNOSIS — R059 Cough, unspecified: Secondary | ICD-10-CM | POA: Diagnosis not present

## 2023-06-26 DIAGNOSIS — W19XXXD Unspecified fall, subsequent encounter: Secondary | ICD-10-CM | POA: Diagnosis not present

## 2023-06-26 DIAGNOSIS — G8929 Other chronic pain: Secondary | ICD-10-CM | POA: Diagnosis not present

## 2023-06-26 DIAGNOSIS — I2510 Atherosclerotic heart disease of native coronary artery without angina pectoris: Secondary | ICD-10-CM | POA: Diagnosis not present

## 2023-06-26 DIAGNOSIS — R296 Repeated falls: Secondary | ICD-10-CM | POA: Diagnosis not present

## 2023-06-26 DIAGNOSIS — R41841 Cognitive communication deficit: Secondary | ICD-10-CM | POA: Diagnosis not present

## 2023-06-26 DIAGNOSIS — G20A1 Parkinson's disease without dyskinesia, without mention of fluctuations: Secondary | ICD-10-CM | POA: Diagnosis not present

## 2023-06-26 DIAGNOSIS — F411 Generalized anxiety disorder: Secondary | ICD-10-CM | POA: Diagnosis not present

## 2023-06-26 DIAGNOSIS — M6281 Muscle weakness (generalized): Secondary | ICD-10-CM | POA: Diagnosis not present

## 2023-06-26 DIAGNOSIS — J698 Pneumonitis due to inhalation of other solids and liquids: Secondary | ICD-10-CM | POA: Diagnosis not present

## 2023-06-26 DIAGNOSIS — I4891 Unspecified atrial fibrillation: Secondary | ICD-10-CM | POA: Diagnosis not present

## 2023-06-26 DIAGNOSIS — K219 Gastro-esophageal reflux disease without esophagitis: Secondary | ICD-10-CM | POA: Diagnosis not present

## 2023-06-26 DIAGNOSIS — E039 Hypothyroidism, unspecified: Secondary | ICD-10-CM | POA: Diagnosis not present

## 2023-06-26 DIAGNOSIS — J9600 Acute respiratory failure, unspecified whether with hypoxia or hypercapnia: Secondary | ICD-10-CM | POA: Diagnosis not present

## 2023-06-26 DIAGNOSIS — Z7401 Bed confinement status: Secondary | ICD-10-CM | POA: Diagnosis not present

## 2023-06-26 DIAGNOSIS — I350 Nonrheumatic aortic (valve) stenosis: Secondary | ICD-10-CM | POA: Diagnosis not present

## 2023-06-26 DIAGNOSIS — R2689 Other abnormalities of gait and mobility: Secondary | ICD-10-CM | POA: Diagnosis not present

## 2023-06-26 DIAGNOSIS — G629 Polyneuropathy, unspecified: Secondary | ICD-10-CM | POA: Diagnosis not present

## 2023-06-26 DIAGNOSIS — F32A Depression, unspecified: Secondary | ICD-10-CM | POA: Diagnosis not present

## 2023-06-26 DIAGNOSIS — Z794 Long term (current) use of insulin: Secondary | ICD-10-CM | POA: Diagnosis not present

## 2023-06-26 DIAGNOSIS — I110 Hypertensive heart disease with heart failure: Secondary | ICD-10-CM | POA: Diagnosis not present

## 2023-06-26 DIAGNOSIS — E1169 Type 2 diabetes mellitus with other specified complication: Secondary | ICD-10-CM | POA: Diagnosis not present

## 2023-06-26 DIAGNOSIS — G4733 Obstructive sleep apnea (adult) (pediatric): Secondary | ICD-10-CM | POA: Diagnosis not present

## 2023-06-26 DIAGNOSIS — M109 Gout, unspecified: Secondary | ICD-10-CM | POA: Diagnosis not present

## 2023-06-26 DIAGNOSIS — D563 Thalassemia minor: Secondary | ICD-10-CM | POA: Diagnosis not present

## 2023-06-26 DIAGNOSIS — R410 Disorientation, unspecified: Secondary | ICD-10-CM | POA: Diagnosis not present

## 2023-06-26 DIAGNOSIS — S34139A Unspecified injury to sacral spinal cord, initial encounter: Secondary | ICD-10-CM | POA: Diagnosis not present

## 2023-06-26 DIAGNOSIS — I5042 Chronic combined systolic (congestive) and diastolic (congestive) heart failure: Secondary | ICD-10-CM | POA: Diagnosis not present

## 2023-06-26 DIAGNOSIS — Z7901 Long term (current) use of anticoagulants: Secondary | ICD-10-CM | POA: Diagnosis not present

## 2023-06-26 LAB — CBG MONITORING, ED
Glucose-Capillary: 234 mg/dL — ABNORMAL HIGH (ref 70–99)
Glucose-Capillary: 186 mg/dL — ABNORMAL HIGH (ref 70–99)

## 2023-06-26 MED ORDER — HUMULIN N KWIKPEN 100 UNIT/ML ~~LOC~~ SUPN
PEN_INJECTOR | SUBCUTANEOUS | 5 refills | Status: DC
Start: 2023-06-26 — End: 2023-07-03
  Filled 2023-06-26: qty 9, 25d supply, fill #0

## 2023-06-26 NOTE — ED Provider Notes (Signed)
 Emergency Medicine Observation Re-evaluation Note  Breanna Webster is a 78 y.o. female, seen on rounds today.  Pt initially presented to the ED for complaints of Fall Currently, the patient is yelling.  Physical Exam  BP (!) 150/89 (BP Location: Left Arm)   Pulse 96   Temp 98.3 F (36.8 C) (Oral)   Resp 20   Ht 5\' 6"  (1.676 m)   Wt 72.6 kg   SpO2 100%   BMI 25.82 kg/m  Physical Exam General: Agitated Cardiac: Normal rate Lungs: No increased work of breathing Psych: Agitated  ED Course / MDM  EKG:EKG Interpretation Date/Time:  Thursday June 20 2023 03:35:27 EST Ventricular Rate:  85 PR Interval:  211 QRS Duration:  165 QT Interval:  457 QTC Calculation: 544 R Axis:   -45  Text Interpretation: Sinus rhythm LAE, consider biatrial enlargement Left bundle branch block No acute changes Confirmed by Drema Pry [phone]) on 06/20/2023 4:01:42 AM  I have reviewed the labs performed to date as well as medications administered while in observation.  Recent changes in the last 24 hours include ongoing yelling.  Plan  Current plan is for SNF placement.    Rolan Bucco, MD 06/26/23 [phone]

## 2023-06-26 NOTE — Progress Notes (Addendum)
 1:15pm: CSW received patient's Breanna Webster - [id number] H.  CSW spoke with patient's husband Breanna Webster to inform of plan - he is agreeable.  Patient will go to [facility] via [facility] - RN to call when ready. The number for report is [phone].  9am: CSW spoke with several staff members at [facility] including a supervisor who all state there is no way to expedite the PASRR request and that it could take up to 5 business days to be issued.  Per [facility] portal - patient's case is assigned to Jana Half and has been pending since 1:28pm on 06/24/23. There are no requests for additional documentation.  CSW will continue to check portal to check for updates.  CSW has communicated with Belenda Cruise at [facility] Supervisor to inform them of reason for delayed discharge.  8:30am: CSW received call from patient's husband Breanna Webster to inform him that patient's PASRR remains pending.  7:45am: Patient's PASRR remains pending at this time.  CSW attempted tor reach patient's husband to provide him with an update however no answer so a voicemail was left providing him with an update.  Edwin Dada, MSW, LCSW Transitions of Care  Clinical Social Worker II [phone]

## 2023-06-26 NOTE — ED Notes (Signed)
 Called PT[facility]

## 2023-06-26 NOTE — ED Notes (Signed)
 Report called to [facility]

## 2023-06-27 ENCOUNTER — Ambulatory Visit: Payer: Medicare Other | Admitting: Gastroenterology

## 2023-06-27 ENCOUNTER — Telehealth (HOSPITAL_COMMUNITY): Payer: Self-pay | Admitting: Cardiology

## 2023-06-27 DIAGNOSIS — R451 Restlessness and agitation: Secondary | ICD-10-CM | POA: Diagnosis not present

## 2023-06-27 DIAGNOSIS — R059 Cough, unspecified: Secondary | ICD-10-CM | POA: Diagnosis not present

## 2023-06-27 DIAGNOSIS — F419 Anxiety disorder, unspecified: Secondary | ICD-10-CM | POA: Diagnosis not present

## 2023-06-27 DIAGNOSIS — J9600 Acute respiratory failure, unspecified whether with hypoxia or hypercapnia: Secondary | ICD-10-CM | POA: Diagnosis not present

## 2023-06-27 DIAGNOSIS — T17908A Unspecified foreign body in respiratory tract, part unspecified causing other injury, initial encounter: Secondary | ICD-10-CM | POA: Diagnosis not present

## 2023-06-27 NOTE — Telephone Encounter (Signed)
 Patients husband had the [facility] call and cancel echocardiogram due to he wants to wait til patient is discharged from the facility. Patient will cll back to reschedule.   06/27/23 [facility] called and spouse wants to cancel til patient is out of the facility.LBW 9:50  I will defer echo til next month and then cal patient to schedule to be sure it gets done. Thank you.

## 2023-06-28 ENCOUNTER — Telehealth: Payer: Self-pay

## 2023-06-28 ENCOUNTER — Telehealth: Payer: Self-pay | Admitting: Adult Health

## 2023-06-28 DIAGNOSIS — T17908A Unspecified foreign body in respiratory tract, part unspecified causing other injury, initial encounter: Secondary | ICD-10-CM | POA: Diagnosis not present

## 2023-06-28 DIAGNOSIS — J9600 Acute respiratory failure, unspecified whether with hypoxia or hypercapnia: Secondary | ICD-10-CM | POA: Diagnosis not present

## 2023-07-02 ENCOUNTER — Other Ambulatory Visit (HOSPITAL_BASED_OUTPATIENT_CLINIC_OR_DEPARTMENT_OTHER): Payer: Self-pay

## 2023-07-02 ENCOUNTER — Telehealth (HOSPITAL_COMMUNITY): Payer: Self-pay

## 2023-07-02 NOTE — Telephone Encounter (Signed)
 Patients husband called to inform us that his wife passed away on 07/07/23 at around 11 am.

## 2023-07-02 NOTE — Telephone Encounter (Signed)
 Called and spoke to patients spouse. My condolences were given.

## 2023-07-04 ENCOUNTER — Ambulatory Visit (HOSPITAL_COMMUNITY): Payer: PRIVATE HEALTH INSURANCE | Admitting: Psychiatry

## 2023-07-09 ENCOUNTER — Ambulatory Visit: Payer: Medicare Other | Admitting: Internal Medicine

## 2023-07-17 ENCOUNTER — Other Ambulatory Visit (HOSPITAL_COMMUNITY): Payer: Medicare Other

## 2023-07-23 NOTE — Telephone Encounter (Signed)
 Copied from CRM [id number]. Topic: General - Other >> Jun 28, 2023  2:29 PM Truddie Crumble wrote: Reason for CRM: patient husband called stating the patient passed away this morning

## 2023-07-23 NOTE — Telephone Encounter (Signed)
 Copied from CRM [id number]. Topic: General - Deceased Patient >> 2023/07/14  2:48 PM Desma Mcgregor wrote: Pt's husband [name] called to inform that the patient has passed away today approximately around 11am.

## 2023-07-23 DEATH — deceased

## 2023-07-24 ENCOUNTER — Ambulatory Visit: Payer: PRIVATE HEALTH INSURANCE | Admitting: General Practice

## 2023-08-12 ENCOUNTER — Other Ambulatory Visit (HOSPITAL_COMMUNITY): Payer: Self-pay

## 2023-08-22 ENCOUNTER — Other Ambulatory Visit (HOSPITAL_COMMUNITY): Payer: Self-pay

## 2024-02-28 ENCOUNTER — Ambulatory Visit: Payer: Medicare Other | Admitting: Hematology

## 2024-02-28 ENCOUNTER — Other Ambulatory Visit: Payer: Medicare Other
